# Patient Record
Sex: Male | Born: 1955 | Race: White | Hispanic: No | Marital: Married | Smoking: Never smoker
Health system: Southern US, Community
[De-identification: ages and names within clinical notes are randomized; demographics above are authoritative.]

## PROBLEM LIST (undated history)

## (undated) DIAGNOSIS — Z9289 Personal history of other medical treatment: Secondary | ICD-10-CM

## (undated) DIAGNOSIS — E1129 Type 2 diabetes mellitus with other diabetic kidney complication: Secondary | ICD-10-CM

## (undated) DIAGNOSIS — Z933 Colostomy status: Secondary | ICD-10-CM

## (undated) DIAGNOSIS — N184 Chronic kidney disease, stage 4 (severe): Secondary | ICD-10-CM

## (undated) DIAGNOSIS — R42 Dizziness and giddiness: Secondary | ICD-10-CM

## (undated) DIAGNOSIS — C61 Malignant neoplasm of prostate: Secondary | ICD-10-CM

## (undated) DIAGNOSIS — R112 Nausea with vomiting, unspecified: Secondary | ICD-10-CM

## (undated) DIAGNOSIS — G459 Transient cerebral ischemic attack, unspecified: Secondary | ICD-10-CM

## (undated) DIAGNOSIS — Z9889 Other specified postprocedural states: Secondary | ICD-10-CM

## (undated) DIAGNOSIS — I499 Cardiac arrhythmia, unspecified: Secondary | ICD-10-CM

## (undated) DIAGNOSIS — I255 Ischemic cardiomyopathy: Secondary | ICD-10-CM

## (undated) DIAGNOSIS — I5022 Chronic systolic (congestive) heart failure: Secondary | ICD-10-CM

## (undated) DIAGNOSIS — IMO0002 Reserved for concepts with insufficient information to code with codable children: Secondary | ICD-10-CM

## (undated) DIAGNOSIS — K219 Gastro-esophageal reflux disease without esophagitis: Secondary | ICD-10-CM

## (undated) DIAGNOSIS — E669 Obesity, unspecified: Secondary | ICD-10-CM

## (undated) DIAGNOSIS — I509 Heart failure, unspecified: Secondary | ICD-10-CM

## (undated) DIAGNOSIS — G4733 Obstructive sleep apnea (adult) (pediatric): Secondary | ICD-10-CM

## (undated) DIAGNOSIS — K8020 Calculus of gallbladder without cholecystitis without obstruction: Secondary | ICD-10-CM

## (undated) DIAGNOSIS — E785 Hyperlipidemia, unspecified: Secondary | ICD-10-CM

## (undated) DIAGNOSIS — D649 Anemia, unspecified: Secondary | ICD-10-CM

## (undated) DIAGNOSIS — Z95810 Presence of automatic (implantable) cardiac defibrillator: Secondary | ICD-10-CM

## (undated) DIAGNOSIS — J189 Pneumonia, unspecified organism: Secondary | ICD-10-CM

## (undated) DIAGNOSIS — Z9989 Dependence on other enabling machines and devices: Secondary | ICD-10-CM

## (undated) DIAGNOSIS — K7460 Unspecified cirrhosis of liver: Secondary | ICD-10-CM

## (undated) DIAGNOSIS — I951 Orthostatic hypotension: Principal | ICD-10-CM

## (undated) DIAGNOSIS — I480 Paroxysmal atrial fibrillation: Secondary | ICD-10-CM

## (undated) DIAGNOSIS — C20 Malignant neoplasm of rectum: Secondary | ICD-10-CM

## (undated) DIAGNOSIS — E1165 Type 2 diabetes mellitus with hyperglycemia: Secondary | ICD-10-CM

## (undated) DIAGNOSIS — R319 Hematuria, unspecified: Secondary | ICD-10-CM

## (undated) DIAGNOSIS — I10 Essential (primary) hypertension: Secondary | ICD-10-CM

## (undated) DIAGNOSIS — I2510 Atherosclerotic heart disease of native coronary artery without angina pectoris: Secondary | ICD-10-CM

## (undated) DIAGNOSIS — Z950 Presence of cardiac pacemaker: Secondary | ICD-10-CM

## (undated) DIAGNOSIS — I609 Nontraumatic subarachnoid hemorrhage, unspecified: Secondary | ICD-10-CM

## (undated) DIAGNOSIS — I219 Acute myocardial infarction, unspecified: Secondary | ICD-10-CM

## (undated) HISTORY — DX: Heart failure, unspecified: I50.9

## (undated) HISTORY — PX: CORONARY ANGIOPLASTY WITH STENT PLACEMENT: SHX49

## (undated) HISTORY — DX: Transient cerebral ischemic attack, unspecified: G45.9

## (undated) HISTORY — DX: Malignant neoplasm of prostate: C61

## (undated) HISTORY — PX: CARDIAC CATHETERIZATION: SHX172

## (undated) HISTORY — DX: Atherosclerotic heart disease of native coronary artery without angina pectoris: I25.10

---

## 1999-12-07 DIAGNOSIS — I219 Acute myocardial infarction, unspecified: Secondary | ICD-10-CM

## 1999-12-07 HISTORY — DX: Acute myocardial infarction, unspecified: I21.9

## 2000-05-18 ENCOUNTER — Ambulatory Visit (HOSPITAL_COMMUNITY): Admission: RE | Admit: 2000-05-18 | Discharge: 2000-05-18 | Payer: Self-pay | Admitting: Cardiovascular Disease

## 2000-05-18 ENCOUNTER — Encounter: Payer: Self-pay | Admitting: Cardiovascular Disease

## 2000-12-06 DIAGNOSIS — Z95810 Presence of automatic (implantable) cardiac defibrillator: Secondary | ICD-10-CM

## 2000-12-06 HISTORY — PX: CARDIAC DEFIBRILLATOR PLACEMENT: SHX171

## 2000-12-06 HISTORY — DX: Presence of automatic (implantable) cardiac defibrillator: Z95.810

## 2001-08-06 ENCOUNTER — Encounter: Payer: Self-pay | Admitting: Emergency Medicine

## 2001-08-06 ENCOUNTER — Inpatient Hospital Stay (HOSPITAL_COMMUNITY): Admission: EM | Admit: 2001-08-06 | Discharge: 2001-08-12 | Payer: Self-pay | Admitting: Emergency Medicine

## 2001-08-12 ENCOUNTER — Encounter: Payer: Self-pay | Admitting: Cardiovascular Disease

## 2001-08-15 ENCOUNTER — Encounter: Payer: Self-pay | Admitting: Cardiovascular Disease

## 2001-08-15 ENCOUNTER — Ambulatory Visit (HOSPITAL_COMMUNITY): Admission: RE | Admit: 2001-08-15 | Discharge: 2001-08-15 | Payer: Self-pay | Admitting: Cardiovascular Disease

## 2002-06-07 ENCOUNTER — Encounter: Admission: RE | Admit: 2002-06-07 | Discharge: 2002-09-05 | Payer: Self-pay | Admitting: Internal Medicine

## 2002-08-30 ENCOUNTER — Emergency Department (HOSPITAL_COMMUNITY): Admission: EM | Admit: 2002-08-30 | Discharge: 2002-08-30 | Payer: Self-pay | Admitting: Internal Medicine

## 2002-08-30 ENCOUNTER — Encounter: Payer: Self-pay | Admitting: Internal Medicine

## 2006-12-06 HISTORY — PX: COLOSTOMY: SHX63

## 2007-05-10 ENCOUNTER — Ambulatory Visit: Payer: Self-pay | Admitting: Gastroenterology

## 2007-05-17 ENCOUNTER — Encounter: Payer: Self-pay | Admitting: Internal Medicine

## 2007-05-17 ENCOUNTER — Ambulatory Visit: Payer: Self-pay | Admitting: Internal Medicine

## 2007-05-17 ENCOUNTER — Ambulatory Visit (HOSPITAL_COMMUNITY): Admission: RE | Admit: 2007-05-17 | Discharge: 2007-05-17 | Payer: Self-pay | Admitting: Internal Medicine

## 2007-05-25 ENCOUNTER — Ambulatory Visit: Payer: Self-pay | Admitting: Oncology

## 2007-05-26 ENCOUNTER — Ambulatory Visit (HOSPITAL_COMMUNITY): Admission: RE | Admit: 2007-05-26 | Discharge: 2007-05-26 | Payer: Self-pay | Admitting: General Surgery

## 2007-06-06 ENCOUNTER — Ambulatory Visit: Admission: RE | Admit: 2007-06-06 | Discharge: 2007-08-22 | Payer: Self-pay | Admitting: Radiation Oncology

## 2007-06-06 HISTORY — PX: PORTACATH PLACEMENT: SHX2246

## 2007-06-06 LAB — CEA: CEA: 0.9 ng/mL (ref 0.0–5.0)

## 2007-06-06 LAB — COMPREHENSIVE METABOLIC PANEL
Albumin: 4.1 g/dL (ref 3.5–5.2)
CO2: 24 mEq/L (ref 19–32)
Chloride: 103 mEq/L (ref 96–112)
Glucose, Bld: 213 mg/dL — ABNORMAL HIGH (ref 70–99)
Potassium: 4.3 mEq/L (ref 3.5–5.3)
Sodium: 138 mEq/L (ref 135–145)
Total Protein: 6.7 g/dL (ref 6.0–8.3)

## 2007-06-06 LAB — CBC WITH DIFFERENTIAL/PLATELET
Eosinophils Absolute: 0.2 10*3/uL (ref 0.0–0.5)
LYMPH%: 28.6 % (ref 14.0–48.0)
MONO#: 0.6 10*3/uL (ref 0.1–0.9)
NEUT#: 4.8 10*3/uL (ref 1.5–6.5)
Platelets: 152 10*3/uL (ref 145–400)
RBC: 4.24 10*6/uL (ref 4.20–5.71)
RDW: 12.8 % (ref 11.2–14.6)
WBC: 8 10*3/uL (ref 4.0–10.0)

## 2007-06-07 LAB — PSA, MEDICARE: PSA: 3.82 ng/mL (ref 0.10–4.00)

## 2007-06-08 ENCOUNTER — Ambulatory Visit (HOSPITAL_COMMUNITY): Admission: RE | Admit: 2007-06-08 | Discharge: 2007-06-08 | Payer: Self-pay | Admitting: Surgery

## 2007-06-26 LAB — CBC WITH DIFFERENTIAL/PLATELET
BASO%: 0.3 % (ref 0.0–2.0)
EOS%: 2.2 % (ref 0.0–7.0)
MCH: 32.2 pg (ref 28.0–33.4)
MCHC: 35.4 g/dL (ref 32.0–35.9)
MONO#: 0.7 10*3/uL (ref 0.1–0.9)
RDW: 12.5 % (ref 11.2–14.6)
WBC: 9.3 10*3/uL (ref 4.0–10.0)
lymph#: 2.4 10*3/uL (ref 0.9–3.3)

## 2007-07-07 ENCOUNTER — Ambulatory Visit (HOSPITAL_COMMUNITY): Admission: RE | Admit: 2007-07-07 | Discharge: 2007-07-07 | Payer: Self-pay | Admitting: Urology

## 2007-07-11 LAB — CBC WITH DIFFERENTIAL/PLATELET
Basophils Absolute: 0 10*3/uL (ref 0.0–0.1)
EOS%: 2.9 % (ref 0.0–7.0)
Eosinophils Absolute: 0.3 10*3/uL (ref 0.0–0.5)
HCT: 40 % (ref 38.7–49.9)
HGB: 14 g/dL (ref 13.0–17.1)
MONO#: 0.5 10*3/uL (ref 0.1–0.9)
NEUT#: 6.6 10*3/uL — ABNORMAL HIGH (ref 1.5–6.5)
RDW: 12.4 % (ref 11.2–14.6)
WBC: 9.5 10*3/uL (ref 4.0–10.0)
lymph#: 2.1 10*3/uL (ref 0.9–3.3)

## 2007-07-17 ENCOUNTER — Ambulatory Visit: Payer: Self-pay | Admitting: Oncology

## 2007-07-17 LAB — CBC WITH DIFFERENTIAL/PLATELET
Basophils Absolute: 0 10*3/uL (ref 0.0–0.1)
Eosinophils Absolute: 0.2 10*3/uL (ref 0.0–0.5)
HCT: 37.3 % — ABNORMAL LOW (ref 38.7–49.9)
HGB: 13.4 g/dL (ref 13.0–17.1)
MCV: 90 fL (ref 81.6–98.0)
MONO%: 7.2 % (ref 0.0–13.0)
NEUT#: 5.6 10*3/uL (ref 1.5–6.5)
NEUT%: 67.1 % (ref 40.0–75.0)
Platelets: 153 10*3/uL (ref 145–400)
RDW: 10.5 % — ABNORMAL LOW (ref 11.2–14.6)

## 2007-07-25 LAB — CBC WITH DIFFERENTIAL/PLATELET
Basophils Absolute: 0 10*3/uL (ref 0.0–0.1)
Eosinophils Absolute: 0.2 10*3/uL (ref 0.0–0.5)
HGB: 12.8 g/dL — ABNORMAL LOW (ref 13.0–17.1)
LYMPH%: 14.7 % (ref 14.0–48.0)
MCV: 92.2 fL (ref 81.6–98.0)
MONO%: 8.1 % (ref 0.0–13.0)
NEUT#: 4.4 10*3/uL (ref 1.5–6.5)
Platelets: 122 10*3/uL — ABNORMAL LOW (ref 145–400)
RBC: 3.91 10*6/uL — ABNORMAL LOW (ref 4.20–5.71)

## 2007-07-31 LAB — CBC WITH DIFFERENTIAL/PLATELET
BASO%: 0.5 % (ref 0.0–2.0)
LYMPH%: 13.6 % — ABNORMAL LOW (ref 14.0–48.0)
MCHC: 35.2 g/dL (ref 32.0–35.9)
MCV: 92.5 fL (ref 81.6–98.0)
MONO%: 8.2 % (ref 0.0–13.0)
Platelets: 154 10*3/uL (ref 145–400)
RBC: 4.08 10*6/uL — ABNORMAL LOW (ref 4.20–5.71)

## 2007-08-08 LAB — CBC WITH DIFFERENTIAL/PLATELET
Eosinophils Absolute: 0.3 10*3/uL (ref 0.0–0.5)
LYMPH%: 12.8 % — ABNORMAL LOW (ref 14.0–48.0)
MCH: 32.9 pg (ref 28.0–33.4)
MCHC: 36.5 g/dL — ABNORMAL HIGH (ref 32.0–35.9)
MCV: 89.9 fL (ref 81.6–98.0)
MONO%: 9.3 % (ref 0.0–13.0)
NEUT#: 4.4 10*3/uL (ref 1.5–6.5)
Platelets: 163 10*3/uL (ref 145–400)
RBC: 4.18 10*6/uL — ABNORMAL LOW (ref 4.20–5.71)

## 2007-08-17 LAB — CBC WITH DIFFERENTIAL/PLATELET
BASO%: 0.3 % (ref 0.0–2.0)
LYMPH%: 10.3 % — ABNORMAL LOW (ref 14.0–48.0)
MCHC: 35.5 g/dL (ref 32.0–35.9)
MCV: 94.8 fL (ref 81.6–98.0)
MONO#: 0.7 10*3/uL (ref 0.1–0.9)
MONO%: 11.5 % (ref 0.0–13.0)
Platelets: 171 10*3/uL (ref 145–400)
RBC: 4.06 10*6/uL — ABNORMAL LOW (ref 4.20–5.71)
WBC: 5.8 10*3/uL (ref 4.0–10.0)

## 2007-10-04 ENCOUNTER — Encounter (INDEPENDENT_AMBULATORY_CARE_PROVIDER_SITE_OTHER): Payer: Self-pay | Admitting: General Surgery

## 2007-10-04 ENCOUNTER — Inpatient Hospital Stay (HOSPITAL_COMMUNITY): Admission: RE | Admit: 2007-10-04 | Discharge: 2007-10-15 | Payer: Self-pay | Admitting: General Surgery

## 2007-10-04 HISTORY — PX: OTHER SURGICAL HISTORY: SHX169

## 2007-10-06 ENCOUNTER — Ambulatory Visit: Payer: Self-pay | Admitting: Oncology

## 2007-11-07 LAB — CBC WITH DIFFERENTIAL/PLATELET
BASO%: 0.2 % (ref 0.0–2.0)
MCHC: 35.2 g/dL (ref 32.0–35.9)
MONO#: 0.5 10*3/uL (ref 0.1–0.9)
RBC: 3.53 10*6/uL — ABNORMAL LOW (ref 4.20–5.71)
RDW: 13.1 % (ref 11.2–14.6)
WBC: 6.4 10*3/uL (ref 4.0–10.0)
lymph#: 0.9 10*3/uL (ref 0.9–3.3)

## 2007-11-07 LAB — COMPREHENSIVE METABOLIC PANEL
ALT: 13 U/L (ref 0–53)
CO2: 26 mEq/L (ref 19–32)
Calcium: 8.4 mg/dL (ref 8.4–10.5)
Chloride: 103 mEq/L (ref 96–112)
Sodium: 133 mEq/L — ABNORMAL LOW (ref 135–145)
Total Protein: 6.4 g/dL (ref 6.0–8.3)

## 2007-11-21 ENCOUNTER — Ambulatory Visit: Payer: Self-pay | Admitting: Oncology

## 2007-11-21 LAB — CBC WITH DIFFERENTIAL/PLATELET
BASO%: 1.2 % (ref 0.0–2.0)
EOS%: 4 % (ref 0.0–7.0)
LYMPH%: 14.2 % (ref 14.0–48.0)
MCH: 32.3 pg (ref 28.0–33.4)
MCHC: 35.2 g/dL (ref 32.0–35.9)
MONO#: 0.6 10*3/uL (ref 0.1–0.9)
Platelets: 154 10*3/uL (ref 145–400)
RBC: 3.66 10*6/uL — ABNORMAL LOW (ref 4.20–5.71)
WBC: 5 10*3/uL (ref 4.0–10.0)
lymph#: 0.7 10*3/uL — ABNORMAL LOW (ref 0.9–3.3)

## 2007-11-21 LAB — COMPREHENSIVE METABOLIC PANEL
ALT: 15 U/L (ref 0–53)
AST: 14 U/L (ref 0–37)
CO2: 27 mEq/L (ref 19–32)
Sodium: 135 mEq/L (ref 135–145)
Total Bilirubin: 0.7 mg/dL (ref 0.3–1.2)
Total Protein: 6.3 g/dL (ref 6.0–8.3)

## 2007-12-07 HISTORY — PX: CARDIAC DEFIBRILLATOR PLACEMENT: SHX171

## 2007-12-07 HISTORY — PX: BI-VENTRICULAR IMPLANTABLE CARDIOVERTER DEFIBRILLATOR  (CRT-D): SHX5747

## 2007-12-11 LAB — CBC WITH DIFFERENTIAL/PLATELET
BASO%: 0.4 % (ref 0.0–2.0)
Basophils Absolute: 0 10*3/uL (ref 0.0–0.1)
Eosinophils Absolute: 0.2 10*3/uL (ref 0.0–0.5)
HCT: 37.3 % — ABNORMAL LOW (ref 38.7–49.9)
HGB: 13.1 g/dL (ref 13.0–17.1)
LYMPH%: 23.3 % (ref 14.0–48.0)
MCHC: 35 g/dL (ref 32.0–35.9)
MONO#: 0.6 10*3/uL (ref 0.1–0.9)
NEUT#: 2.2 10*3/uL (ref 1.5–6.5)
NEUT%: 55.5 % (ref 40.0–75.0)
Platelets: 195 10*3/uL (ref 145–400)
WBC: 4 10*3/uL (ref 4.0–10.0)
lymph#: 0.9 10*3/uL (ref 0.9–3.3)

## 2007-12-11 LAB — COMPREHENSIVE METABOLIC PANEL
ALT: 13 U/L (ref 0–53)
CO2: 25 mEq/L (ref 19–32)
Calcium: 10 mg/dL (ref 8.4–10.5)
Chloride: 100 mEq/L (ref 96–112)
Creatinine, Ser: 1.19 mg/dL (ref 0.40–1.50)
Glucose, Bld: 264 mg/dL — ABNORMAL HIGH (ref 70–99)

## 2007-12-25 LAB — CBC WITH DIFFERENTIAL/PLATELET
BASO%: 0.1 % (ref 0.0–2.0)
Eosinophils Absolute: 0.1 10*3/uL (ref 0.0–0.5)
HCT: 36.8 % — ABNORMAL LOW (ref 38.7–49.9)
MCHC: 35.2 g/dL (ref 32.0–35.9)
MONO#: 0.4 10*3/uL (ref 0.1–0.9)
NEUT#: 4.7 10*3/uL (ref 1.5–6.5)
RBC: 4.03 10*6/uL — ABNORMAL LOW (ref 4.20–5.71)
WBC: 6.3 10*3/uL (ref 4.0–10.0)
lymph#: 1 10*3/uL (ref 0.9–3.3)

## 2007-12-25 LAB — COMPREHENSIVE METABOLIC PANEL
ALT: 12 U/L (ref 0–53)
Albumin: 3.7 g/dL (ref 3.5–5.2)
CO2: 25 mEq/L (ref 19–32)
Calcium: 9.2 mg/dL (ref 8.4–10.5)
Chloride: 99 mEq/L (ref 96–112)
Sodium: 138 mEq/L (ref 135–145)
Total Protein: 6.4 g/dL (ref 6.0–8.3)

## 2008-01-05 ENCOUNTER — Ambulatory Visit: Payer: Self-pay | Admitting: Oncology

## 2008-01-08 LAB — CBC WITH DIFFERENTIAL/PLATELET
BASO%: 0.6 % (ref 0.0–2.0)
MCHC: 35 g/dL (ref 32.0–35.9)
MONO#: 0.4 10*3/uL (ref 0.1–0.9)
RBC: 3.95 10*6/uL — ABNORMAL LOW (ref 4.20–5.71)
WBC: 3.9 10*3/uL — ABNORMAL LOW (ref 4.0–10.0)
lymph#: 0.7 10*3/uL — ABNORMAL LOW (ref 0.9–3.3)

## 2008-01-08 LAB — COMPREHENSIVE METABOLIC PANEL
ALT: 14 U/L (ref 0–53)
AST: 18 U/L (ref 0–37)
CO2: 23 mEq/L (ref 19–32)
Calcium: 9 mg/dL (ref 8.4–10.5)
Chloride: 100 mEq/L (ref 96–112)
Sodium: 135 mEq/L (ref 135–145)
Total Bilirubin: 0.7 mg/dL (ref 0.3–1.2)
Total Protein: 6.2 g/dL (ref 6.0–8.3)

## 2008-01-16 LAB — CBC WITH DIFFERENTIAL/PLATELET
BASO%: 1.7 % (ref 0.0–2.0)
EOS%: 5 % (ref 0.0–7.0)
LYMPH%: 31.1 % (ref 14.0–48.0)
MCH: 31.3 pg (ref 28.0–33.4)
MCHC: 34.5 g/dL (ref 32.0–35.9)
MONO#: 0.6 10*3/uL (ref 0.1–0.9)
Platelets: 143 10*3/uL — ABNORMAL LOW (ref 145–400)
RBC: 4.12 10*6/uL — ABNORMAL LOW (ref 4.20–5.71)
WBC: 3 10*3/uL — ABNORMAL LOW (ref 4.0–10.0)

## 2008-01-29 LAB — CBC WITH DIFFERENTIAL/PLATELET
Basophils Absolute: 0 10*3/uL (ref 0.0–0.1)
Eosinophils Absolute: 0.1 10*3/uL (ref 0.0–0.5)
HCT: 38 % — ABNORMAL LOW (ref 38.7–49.9)
HGB: 13.6 g/dL (ref 13.0–17.1)
LYMPH%: 22 % (ref 14.0–48.0)
MONO#: 0.5 10*3/uL (ref 0.1–0.9)
NEUT#: 2.6 10*3/uL (ref 1.5–6.5)
NEUT%: 61.8 % (ref 40.0–75.0)
Platelets: 96 10*3/uL — ABNORMAL LOW (ref 145–400)
RBC: 4.14 10*6/uL — ABNORMAL LOW (ref 4.20–5.71)
WBC: 4.2 10*3/uL (ref 4.0–10.0)

## 2008-01-29 LAB — COMPREHENSIVE METABOLIC PANEL
CO2: 21 mEq/L (ref 19–32)
Glucose, Bld: 324 mg/dL — ABNORMAL HIGH (ref 70–99)
Sodium: 134 mEq/L — ABNORMAL LOW (ref 135–145)
Total Bilirubin: 0.5 mg/dL (ref 0.3–1.2)
Total Protein: 6.6 g/dL (ref 6.0–8.3)

## 2008-02-06 LAB — CBC WITH DIFFERENTIAL/PLATELET
Basophils Absolute: 0 10*3/uL (ref 0.0–0.1)
EOS%: 5.2 % (ref 0.0–7.0)
HCT: 37.7 % — ABNORMAL LOW (ref 38.7–49.9)
HGB: 13.2 g/dL (ref 13.0–17.1)
LYMPH%: 27.9 % (ref 14.0–48.0)
MCH: 32.2 pg (ref 28.0–33.4)
MCV: 92.1 fL (ref 81.6–98.0)
MONO%: 20.2 % — ABNORMAL HIGH (ref 0.0–13.0)
NEUT%: 46.4 % (ref 40.0–75.0)
RDW: 17 % — ABNORMAL HIGH (ref 11.2–14.6)

## 2008-02-15 ENCOUNTER — Ambulatory Visit: Payer: Self-pay | Admitting: Oncology

## 2008-02-19 LAB — CBC WITH DIFFERENTIAL/PLATELET
BASO%: 0 % (ref 0.0–2.0)
LYMPH%: 20.1 % (ref 14.0–48.0)
MCH: 33.1 pg (ref 28.0–33.4)
MCHC: 35.4 g/dL (ref 32.0–35.9)
MCV: 93.6 fL (ref 81.6–98.0)
NEUT%: 65.3 % (ref 40.0–75.0)
RBC: 3.82 10*6/uL — ABNORMAL LOW (ref 4.20–5.71)
RDW: 15.9 % — ABNORMAL HIGH (ref 11.2–14.6)

## 2008-02-19 LAB — COMPREHENSIVE METABOLIC PANEL
ALT: 12 U/L (ref 0–53)
CO2: 22 mEq/L (ref 19–32)
Calcium: 9.5 mg/dL (ref 8.4–10.5)
Chloride: 103 mEq/L (ref 96–112)
Creatinine, Ser: 1.06 mg/dL (ref 0.40–1.50)
Glucose, Bld: 442 mg/dL — ABNORMAL HIGH (ref 70–99)

## 2008-03-04 LAB — COMPREHENSIVE METABOLIC PANEL
ALT: 15 U/L (ref 0–53)
Albumin: 4 g/dL (ref 3.5–5.2)
CO2: 22 mEq/L (ref 19–32)
Calcium: 9.1 mg/dL (ref 8.4–10.5)
Chloride: 101 mEq/L (ref 96–112)
Creatinine, Ser: 1.09 mg/dL (ref 0.40–1.50)
Potassium: 4.1 mEq/L (ref 3.5–5.3)
Sodium: 136 mEq/L (ref 135–145)
Total Protein: 6.3 g/dL (ref 6.0–8.3)

## 2008-03-04 LAB — CBC WITH DIFFERENTIAL/PLATELET
BASO%: 0.2 % (ref 0.0–2.0)
HCT: 37.2 % — ABNORMAL LOW (ref 38.7–49.9)
MCHC: 35.4 g/dL (ref 32.0–35.9)
MONO#: 0.6 10*3/uL (ref 0.1–0.9)
NEUT%: 66.1 % (ref 40.0–75.0)
WBC: 4.9 10*3/uL (ref 4.0–10.0)
lymph#: 0.8 10*3/uL — ABNORMAL LOW (ref 0.9–3.3)

## 2008-03-12 LAB — CBC WITH DIFFERENTIAL/PLATELET
Basophils Absolute: 0 10*3/uL (ref 0.0–0.1)
EOS%: 4.1 % (ref 0.0–7.0)
HCT: 38.7 % (ref 38.7–49.9)
HGB: 13.8 g/dL (ref 13.0–17.1)
LYMPH%: 24.4 % (ref 14.0–48.0)
MCH: 33.7 pg — ABNORMAL HIGH (ref 28.0–33.4)
MCV: 94.1 fL (ref 81.6–98.0)
MONO%: 17 % — ABNORMAL HIGH (ref 0.0–13.0)
NEUT%: 54.1 % (ref 40.0–75.0)

## 2008-03-25 ENCOUNTER — Ambulatory Visit: Admission: RE | Admit: 2008-03-25 | Discharge: 2008-06-23 | Payer: Self-pay | Admitting: Oncology

## 2008-04-02 ENCOUNTER — Ambulatory Visit: Payer: Self-pay | Admitting: Oncology

## 2008-04-02 LAB — CBC WITH DIFFERENTIAL/PLATELET
Basophils Absolute: 0 10*3/uL (ref 0.0–0.1)
EOS%: 3.7 % (ref 0.0–7.0)
HGB: 13.7 g/dL (ref 13.0–17.1)
MCH: 33.4 pg (ref 28.0–33.4)
MCHC: 35.1 g/dL (ref 32.0–35.9)
MCV: 95.3 fL (ref 81.6–98.0)
MONO%: 16.3 % — ABNORMAL HIGH (ref 0.0–13.0)
RBC: 4.09 10*6/uL — ABNORMAL LOW (ref 4.20–5.71)
RDW: 14.7 % — ABNORMAL HIGH (ref 11.2–14.6)

## 2008-04-02 LAB — COMPREHENSIVE METABOLIC PANEL
AST: 19 U/L (ref 0–37)
Albumin: 3.9 g/dL (ref 3.5–5.2)
Alkaline Phosphatase: 121 U/L — ABNORMAL HIGH (ref 39–117)
BUN: 26 mg/dL — ABNORMAL HIGH (ref 6–23)
Potassium: 4.9 mEq/L (ref 3.5–5.3)

## 2008-04-24 ENCOUNTER — Emergency Department (HOSPITAL_COMMUNITY): Admission: EM | Admit: 2008-04-24 | Discharge: 2008-04-25 | Payer: Self-pay | Admitting: Emergency Medicine

## 2008-05-20 ENCOUNTER — Ambulatory Visit (HOSPITAL_COMMUNITY): Admission: RE | Admit: 2008-05-20 | Discharge: 2008-05-20 | Payer: Self-pay | Admitting: Oncology

## 2008-05-23 ENCOUNTER — Ambulatory Visit: Payer: Self-pay | Admitting: Oncology

## 2008-05-27 LAB — COMPREHENSIVE METABOLIC PANEL
ALT: 13 U/L (ref 0–53)
AST: 16 U/L (ref 0–37)
CO2: 20 mEq/L (ref 19–32)
Calcium: 8.9 mg/dL (ref 8.4–10.5)
Chloride: 104 mEq/L (ref 96–112)
Sodium: 138 mEq/L (ref 135–145)
Total Protein: 6.3 g/dL (ref 6.0–8.3)

## 2008-05-27 LAB — CEA: CEA: 0.8 ng/mL (ref 0.0–5.0)

## 2008-05-27 LAB — CBC WITH DIFFERENTIAL/PLATELET
BASO%: 0.4 % (ref 0.0–2.0)
EOS%: 6.7 % (ref 0.0–7.0)
MCH: 33 pg (ref 28.0–33.4)
MCHC: 35.2 g/dL (ref 32.0–35.9)
MONO#: 0.4 10*3/uL (ref 0.1–0.9)
RBC: 4.05 10*6/uL — ABNORMAL LOW (ref 4.20–5.71)
RDW: 13.2 % (ref 11.2–14.6)
WBC: 4.1 10*3/uL (ref 4.0–10.0)
lymph#: 0.7 10*3/uL — ABNORMAL LOW (ref 0.9–3.3)

## 2008-06-13 ENCOUNTER — Ambulatory Visit (HOSPITAL_COMMUNITY): Admission: RE | Admit: 2008-06-13 | Discharge: 2008-06-13 | Payer: Self-pay | Admitting: General Surgery

## 2008-06-20 DIAGNOSIS — I10 Essential (primary) hypertension: Secondary | ICD-10-CM

## 2008-06-20 DIAGNOSIS — E119 Type 2 diabetes mellitus without complications: Secondary | ICD-10-CM | POA: Insufficient documentation

## 2008-06-20 DIAGNOSIS — I219 Acute myocardial infarction, unspecified: Secondary | ICD-10-CM | POA: Insufficient documentation

## 2008-06-21 DIAGNOSIS — Z85048 Personal history of other malignant neoplasm of rectum, rectosigmoid junction, and anus: Secondary | ICD-10-CM

## 2008-06-21 DIAGNOSIS — I255 Ischemic cardiomyopathy: Secondary | ICD-10-CM

## 2008-08-02 ENCOUNTER — Ambulatory Visit: Payer: Self-pay | Admitting: Internal Medicine

## 2008-08-23 ENCOUNTER — Ambulatory Visit: Payer: Self-pay | Admitting: Oncology

## 2008-08-26 ENCOUNTER — Ambulatory Visit (HOSPITAL_COMMUNITY): Admission: RE | Admit: 2008-08-26 | Discharge: 2008-08-26 | Payer: Self-pay | Admitting: Internal Medicine

## 2008-08-26 ENCOUNTER — Ambulatory Visit: Payer: Self-pay | Admitting: Internal Medicine

## 2008-08-27 LAB — CBC WITH DIFFERENTIAL/PLATELET
BASO%: 0.2 % (ref 0.0–2.0)
EOS%: 3.7 % (ref 0.0–7.0)
LYMPH%: 14.9 % (ref 14.0–48.0)
MCH: 32.8 pg (ref 28.0–33.4)
MCHC: 35.2 g/dL (ref 32.0–35.9)
MCV: 93 fL (ref 81.6–98.0)
MONO#: 0.5 10*3/uL (ref 0.1–0.9)
MONO%: 8.5 % (ref 0.0–13.0)
Platelets: 179 10*3/uL (ref 145–400)
RBC: 3.95 10*6/uL — ABNORMAL LOW (ref 4.20–5.71)
WBC: 6.3 10*3/uL (ref 4.0–10.0)

## 2008-09-22 ENCOUNTER — Inpatient Hospital Stay (HOSPITAL_COMMUNITY): Admission: EM | Admit: 2008-09-22 | Discharge: 2008-09-23 | Payer: Self-pay | Admitting: Emergency Medicine

## 2008-09-27 ENCOUNTER — Inpatient Hospital Stay (HOSPITAL_COMMUNITY): Admission: EM | Admit: 2008-09-27 | Discharge: 2008-10-02 | Payer: Self-pay | Admitting: *Deleted

## 2008-09-27 ENCOUNTER — Ambulatory Visit: Payer: Self-pay | Admitting: Internal Medicine

## 2008-10-04 IMAGING — CT CT CHEST W/ CM
2 of 7 series · 14 of 46 positions shown, 18 images · IV contrast (Omnipaque 300)
Comparison: none

DUPLICATE COPY for exam association in [facility] - No change from original report,   05/29/07
HISTORY: Abnormal colonoscopy, apple core rectal tumor, blood in stool,
 hypertension, diabetes

[Series 7: abd_pel 5.0 b40f · axial · 0.84mm/px · z∈[-767,-330]mm · 11 of 236 slices shown, 15 images]
[im 14/236  soft-tissue]
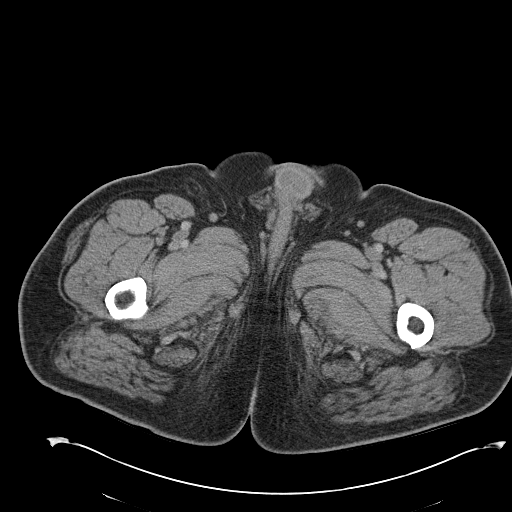
[im 14/236  bone]
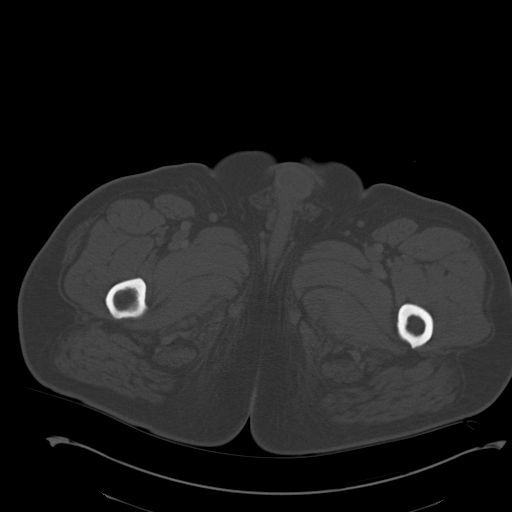
[im 40/236  soft-tissue]
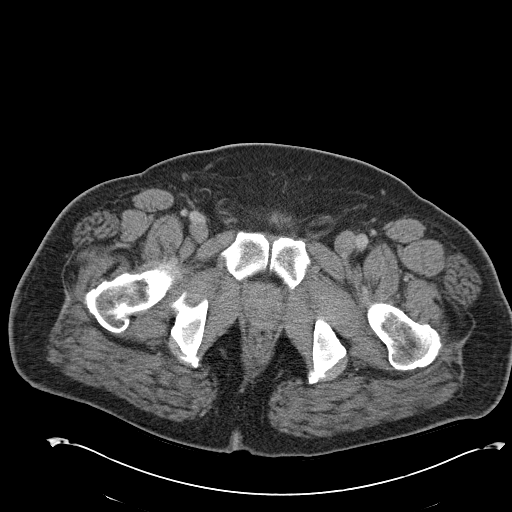
[im 66/236  soft-tissue]
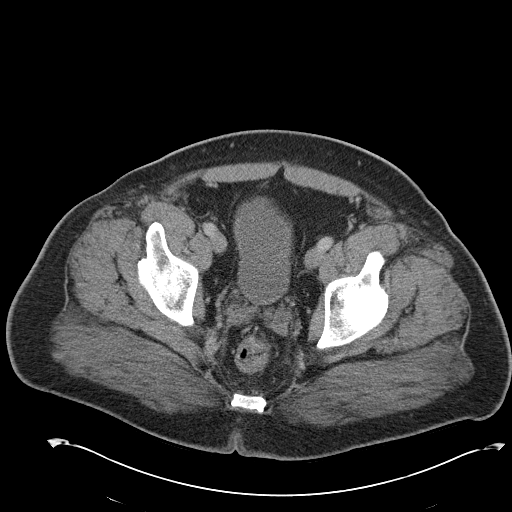
[im 92/236  soft-tissue]
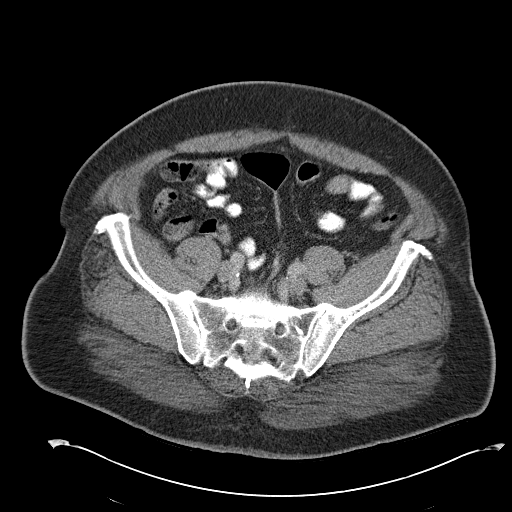
[im 118/236  soft-tissue]
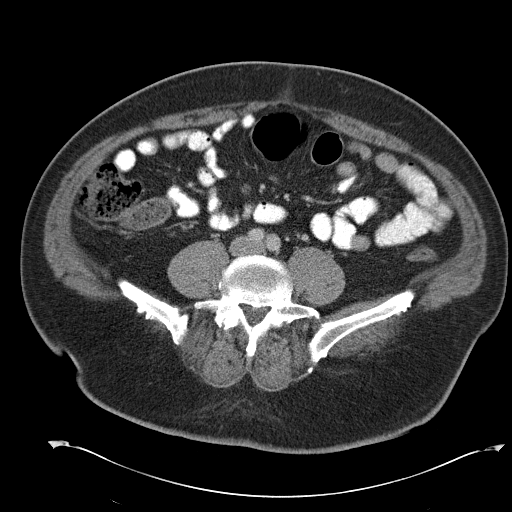
[im 144/236  soft-tissue]
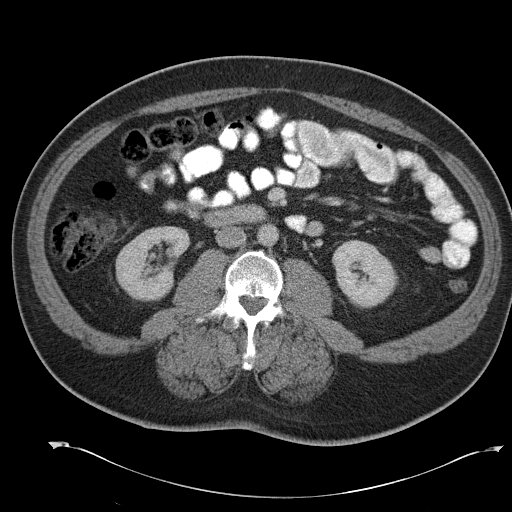
[im 170/236  soft-tissue]
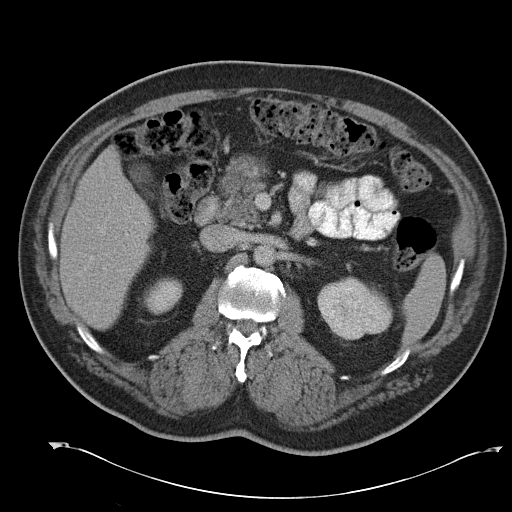
[im 183/236  lung]
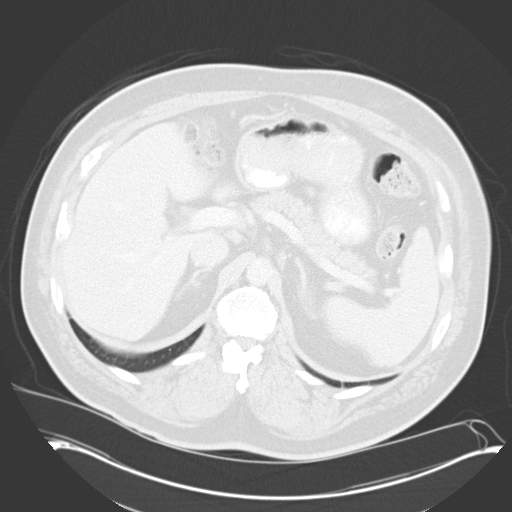
[im 196/236  soft-tissue]
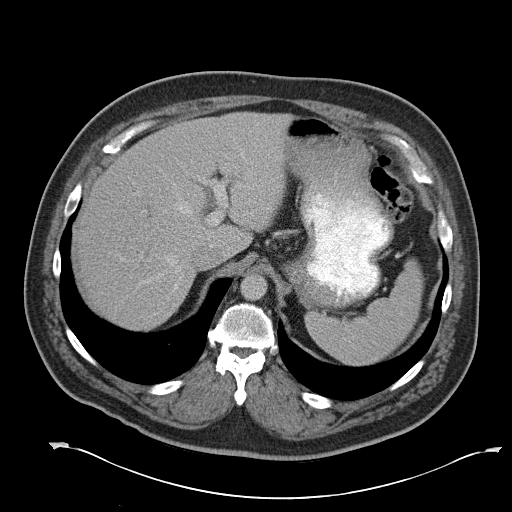
[im 196/236  lung]
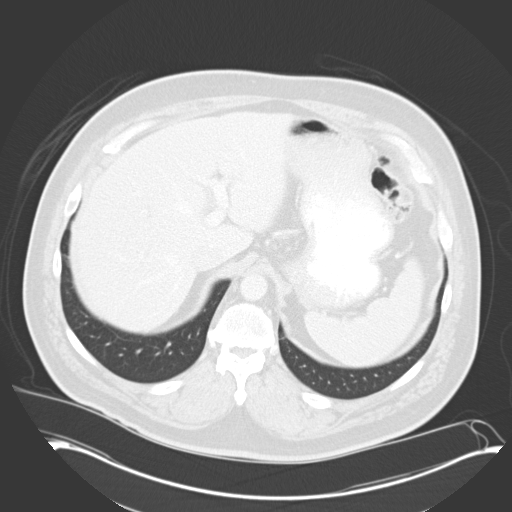
[im 209/236  lung]
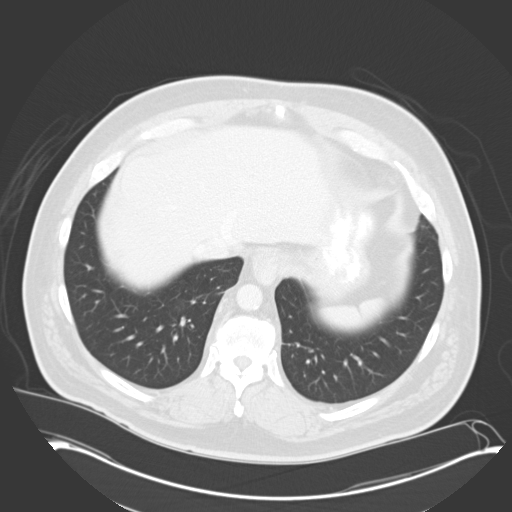
[im 222/236  soft-tissue]
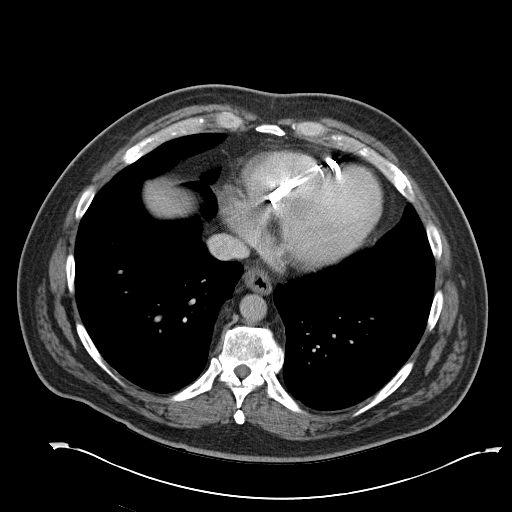
[im 222/236  lung]
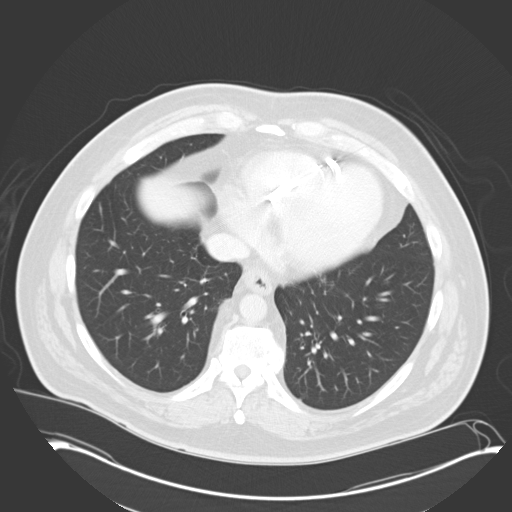
[im 222/236  bone]
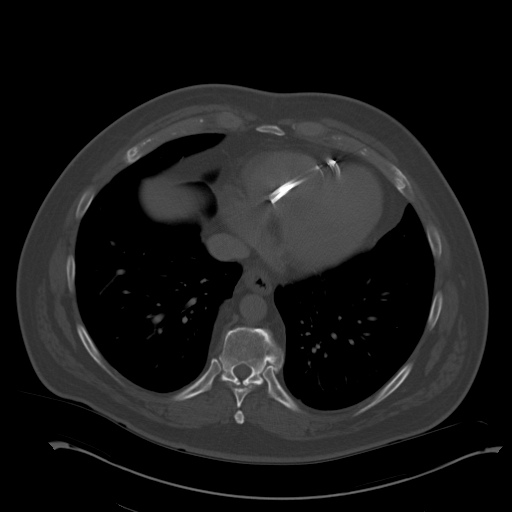

[Series 8: mpr coronal a/p · coronal · 0.79mm/px · 3 of 102 slices shown]
[im 26/102  soft-tissue]
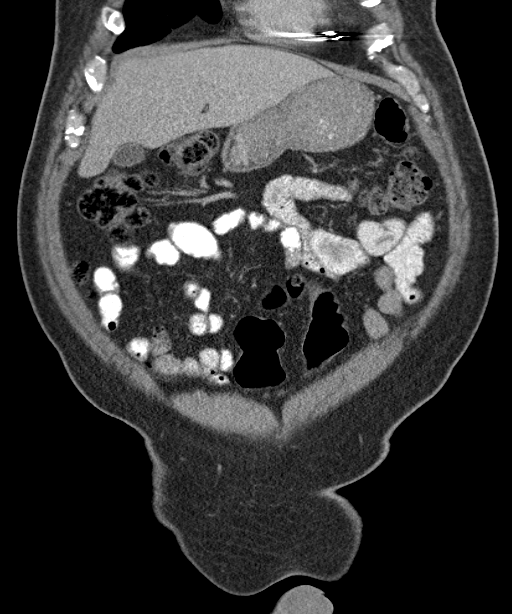
[im 51/102  soft-tissue]
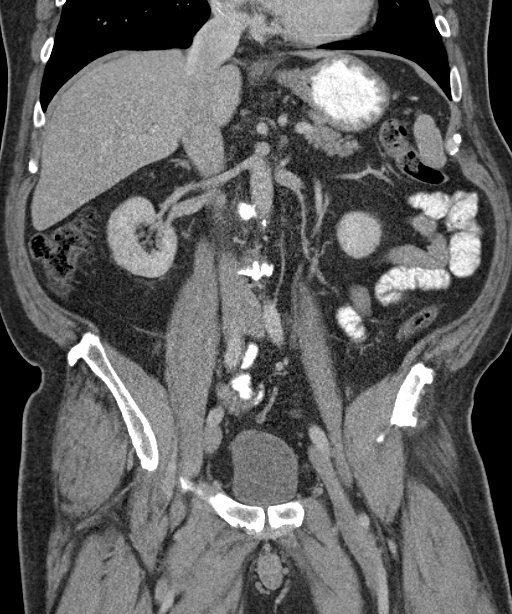
[im 76/102  soft-tissue]
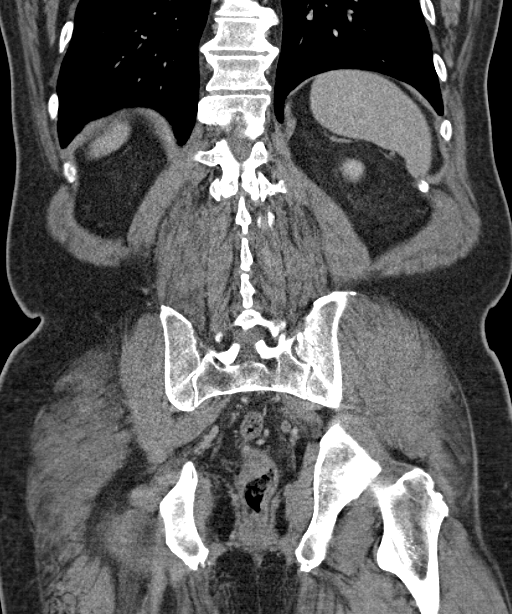

[14 of 46 positions shown; findings below may reference images not displayed]

CT CHEST, ABDOMEN AND PELVIS WITH CONTRAST:

 Multidetector helical CT imaging of chest, abdomen, and pelvis performed.
 Sagittal and coronal images are reconstructed from the axial data set.
 Exam utilized dilute oral contrast and 125 cc 4mnipaque-NLL. 
 No prior exam for comparison.

 CT CHEST:

 Beam hardening artifacts from AICD hardware.
 Mild asymmetric gynecomastia, greater on left.
 Scattered coronary arterial calcifications.
 No thoracic adenopathy.
 Tiny calcified granuloma left lung image 36.
 No other pulmonary nodule, infiltrate, or effusion.
IMPRESSION: No acute thoracic abnormalities, see above.

 CT ABDOMEN:

 Tiny hiatal hernia.
 Liver, spleen, pancreas, kidneys, and adrenal glands normal.
 No upper abdominal mass, adenopathy, free fluid, or inflammatory process.
 Stool throughout colon without definite mass.
 Scattered degenerative disc disease changes thoracolumbar spine.
IMPRESSION: No acute upper abdominal abnormalities.

 CT PELVIS:

 Wall thickening in mid rectum likely represents patient's primary tumor.
 No perirectal extension or infiltrative changes.
 No definite pelvic soft tissue nodule identified or evidence of abnormal fluid
 collection.
 Bladder and seminal vesicles unremarkable with minimal prostatic enlargement
 noted.
 Normal appendix.
 Remaining large and small bowel loops unremarkable.
 No pelvic mass, adenopathy, free fluid, or inflammatory process.
IMPRESSION: Rectal wall thickening compatible with stated history of rectal tumor.
 No evidence of metastatic disease or tumor spread.

## 2008-10-11 ENCOUNTER — Ambulatory Visit (HOSPITAL_COMMUNITY): Admission: RE | Admit: 2008-10-11 | Discharge: 2008-10-11 | Payer: Self-pay | Admitting: Cardiovascular Disease

## 2008-10-15 IMAGING — CR DG CHEST 2V
2 series · 2 of 2 positions shown · non-contrast
Comparison: CT of 05/26/07.

CLINICAL DATA: Rectal cancer.
 CHEST ? 2 VIEW:

[view not recorded (1 of 2)]
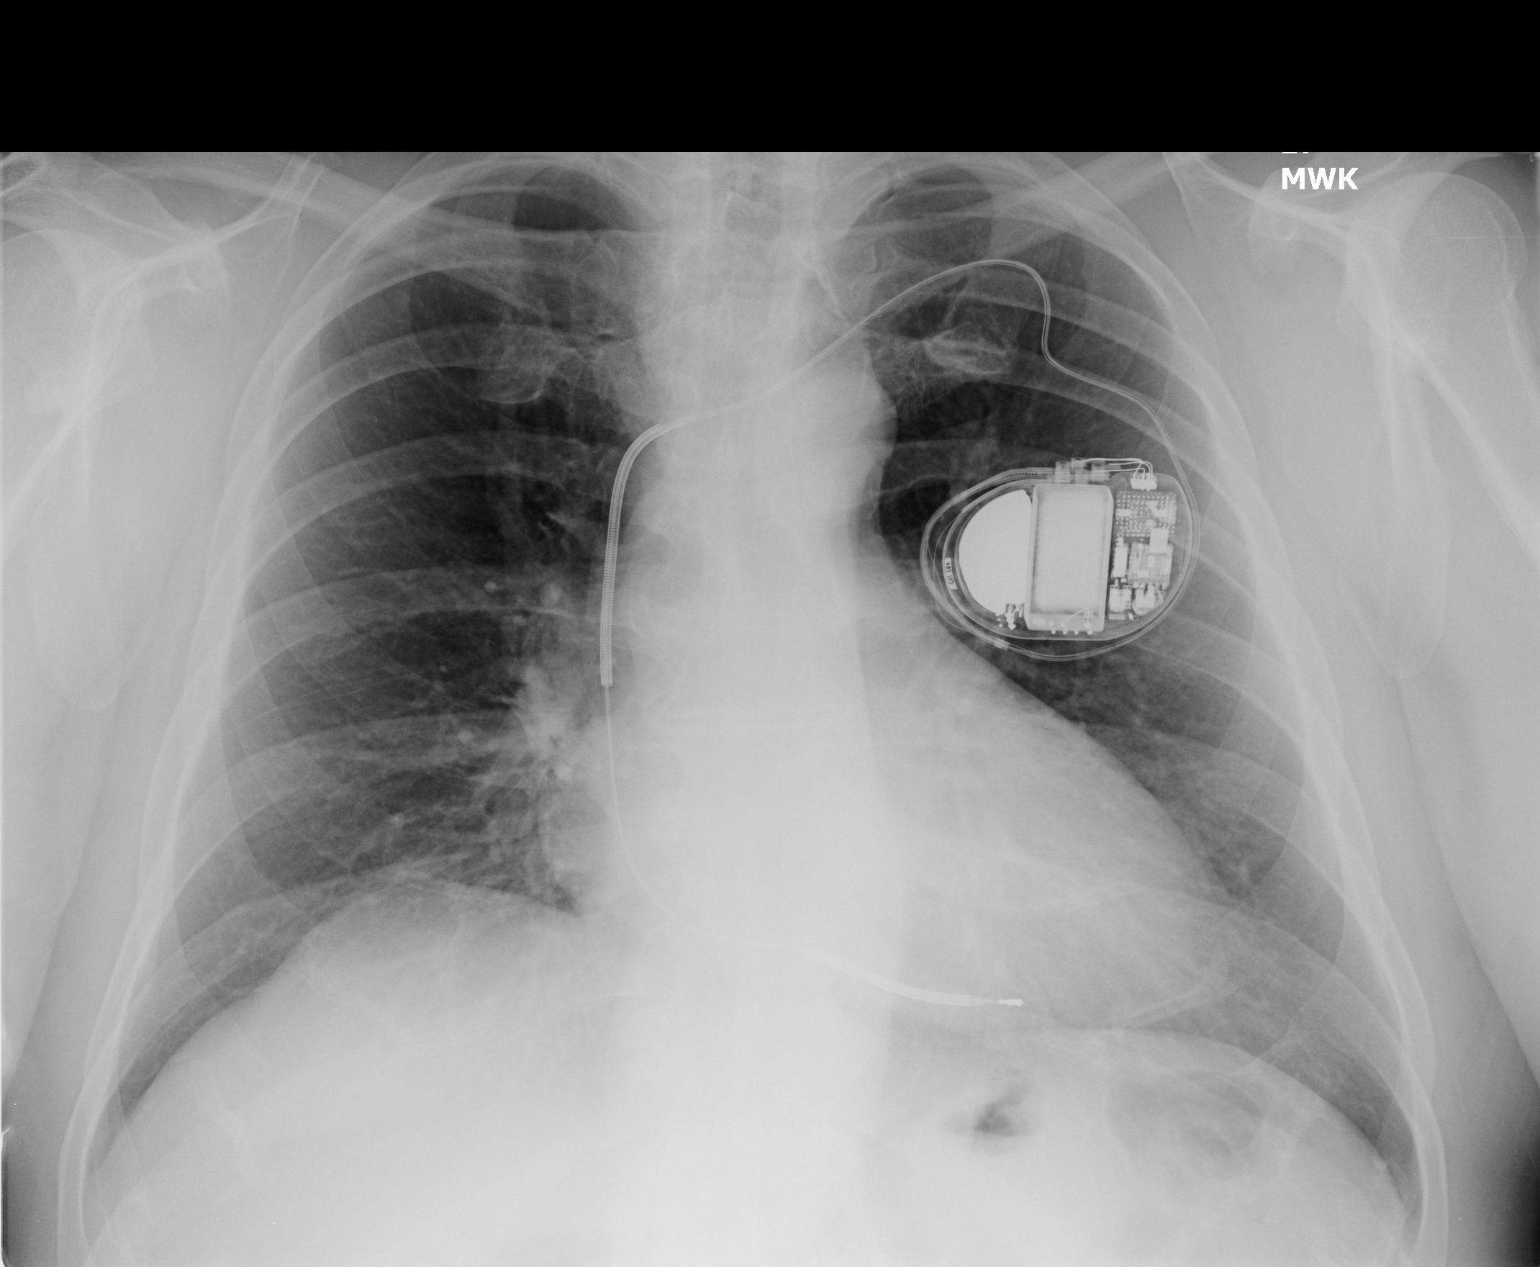

[view not recorded (2 of 2)]
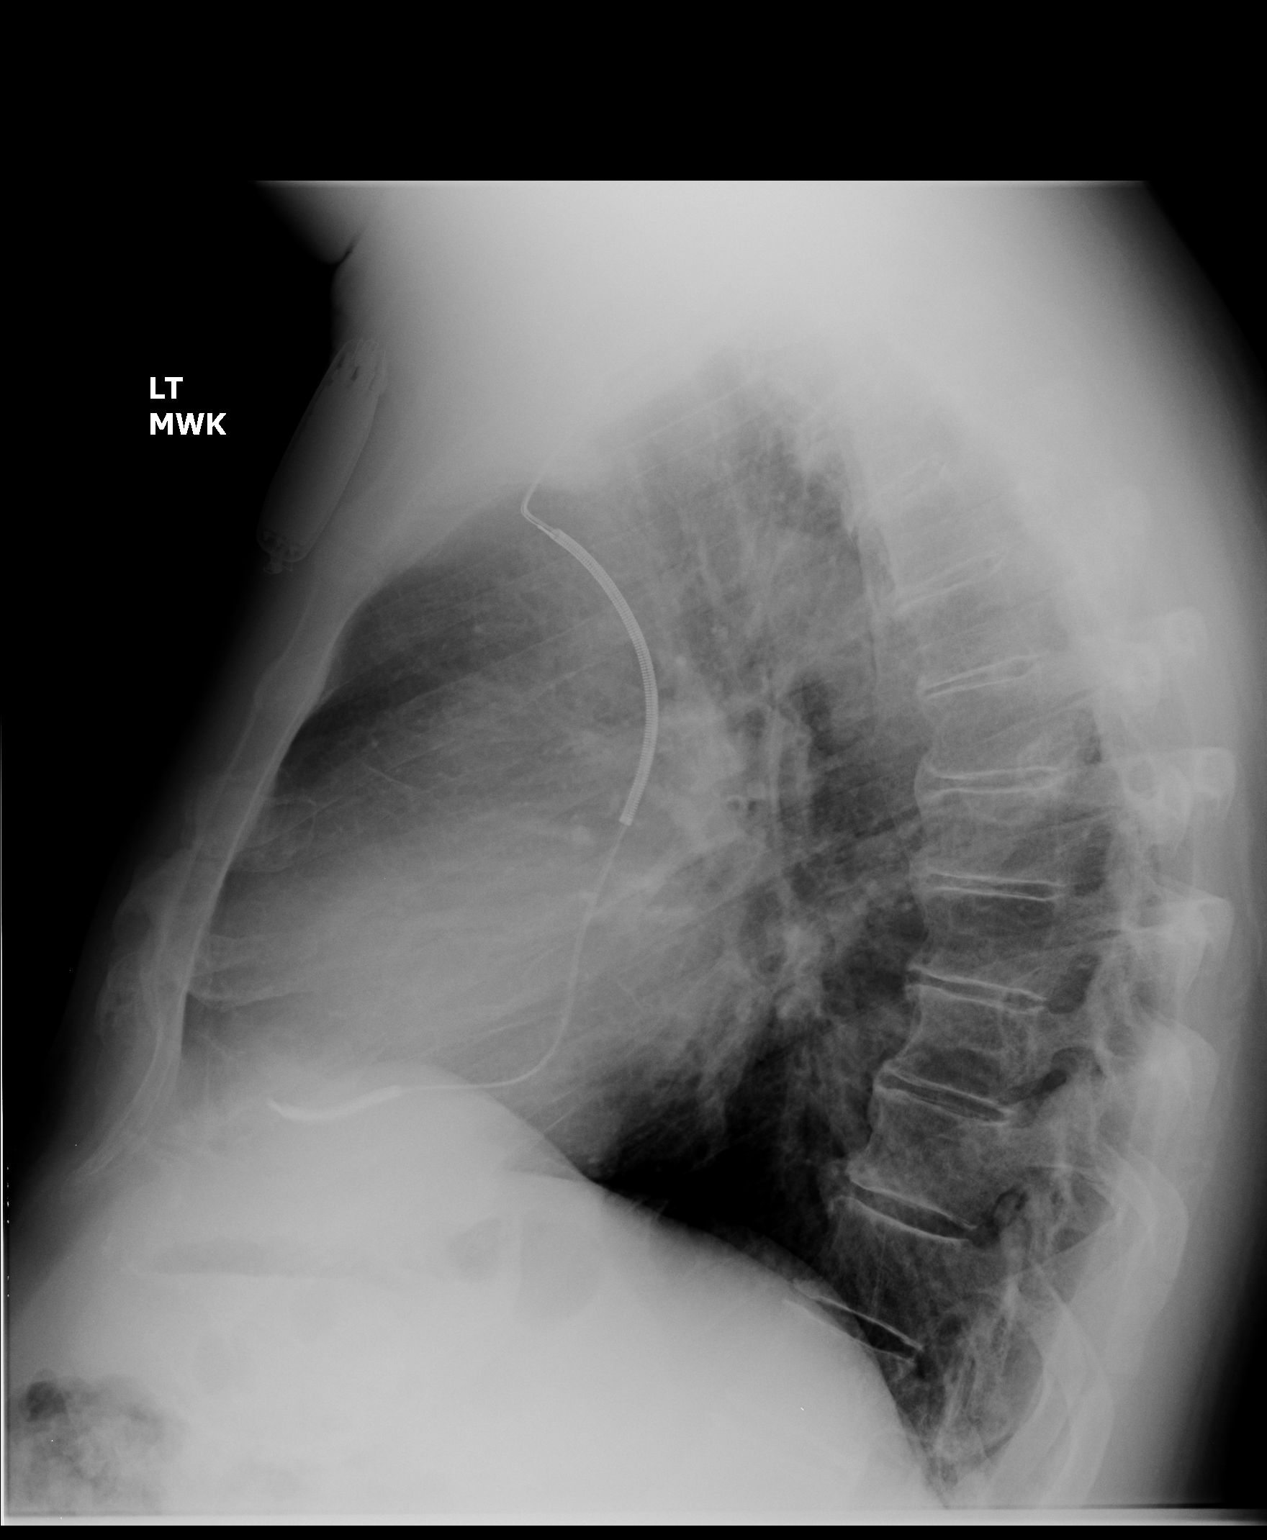

[2 of 2 positions shown; findings below may reference images not displayed]

FINDINGS: Cardioverter defibrillator is in place, grossly well positioned.  The heart is at the upper limits of normal in size.  The vascularity is normal.  The lungs are clear.  No effusions.  Ordinary degenerative changes affect the spine.
IMPRESSION: No active disease.

## 2008-10-17 IMAGING — CR DG CHEST 1V PORT
1 series · 1 of 1 positions shown · non-contrast
Comparison: 06/06/2007.

CLINICAL DATA: Port-A-Cath placement. Rectal cancer.

CHEST - 1 VIEW

[view not recorded]
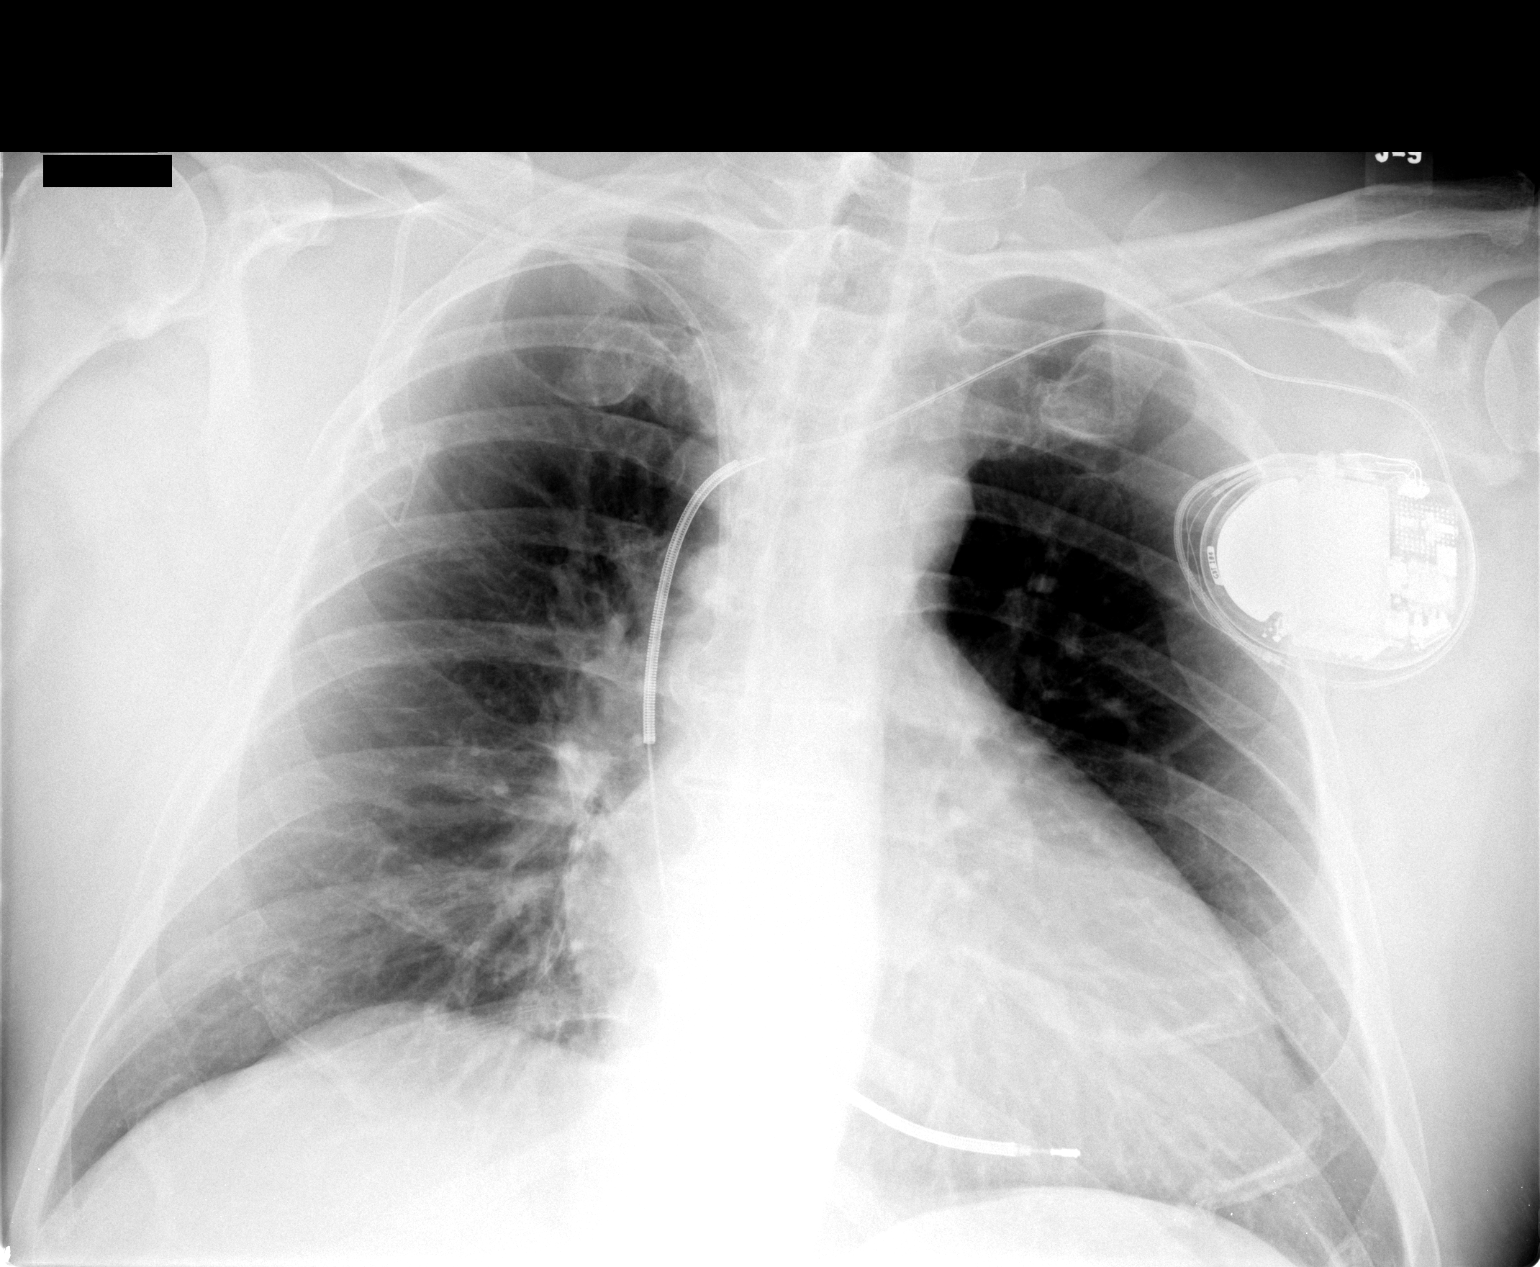

[1 of 1 positions shown; findings below may reference images not displayed]

FINDINGS: Right-sided Port-A-Cath terminates at the mid SVC. No pneumothorax.
Single lead pacer/AICD device is unchanged in position.

Midline trachea. Moderate cardiomegaly. Sharp costophrenic angles. Left
costophrenic angle partially excluded. Clear lungs.

IMPRESSION

1. Right-sided Port-A-Cath appropriately positioned without pneumothorax.
2. Cardiomegaly without congestive failure or acute cardiac pulmonary disease.

## 2008-10-21 ENCOUNTER — Observation Stay (HOSPITAL_COMMUNITY): Admission: EM | Admit: 2008-10-21 | Discharge: 2008-10-22 | Payer: Self-pay | Admitting: Emergency Medicine

## 2008-10-28 ENCOUNTER — Ambulatory Visit: Payer: Self-pay | Admitting: Internal Medicine

## 2008-11-12 ENCOUNTER — Inpatient Hospital Stay (HOSPITAL_COMMUNITY): Admission: EM | Admit: 2008-11-12 | Discharge: 2008-11-15 | Payer: Self-pay | Admitting: *Deleted

## 2008-11-12 ENCOUNTER — Ambulatory Visit: Payer: Self-pay | Admitting: Internal Medicine

## 2008-11-22 ENCOUNTER — Ambulatory Visit: Payer: Self-pay | Admitting: Oncology

## 2008-11-27 ENCOUNTER — Emergency Department (HOSPITAL_COMMUNITY): Admission: EM | Admit: 2008-11-27 | Discharge: 2008-11-27 | Payer: Self-pay | Admitting: Emergency Medicine

## 2008-11-29 ENCOUNTER — Inpatient Hospital Stay (HOSPITAL_COMMUNITY): Admission: EM | Admit: 2008-11-29 | Discharge: 2008-12-09 | Payer: Self-pay | Admitting: Emergency Medicine

## 2008-12-23 ENCOUNTER — Encounter (HOSPITAL_COMMUNITY): Admission: RE | Admit: 2008-12-23 | Discharge: 2009-01-22 | Payer: Self-pay | Admitting: Cardiovascular Disease

## 2009-01-08 ENCOUNTER — Inpatient Hospital Stay (HOSPITAL_COMMUNITY): Admission: EM | Admit: 2009-01-08 | Discharge: 2009-01-09 | Payer: Self-pay | Admitting: Emergency Medicine

## 2009-01-24 ENCOUNTER — Encounter (HOSPITAL_COMMUNITY): Admission: RE | Admit: 2009-01-24 | Discharge: 2009-02-23 | Payer: Self-pay | Admitting: Cardiovascular Disease

## 2009-02-06 ENCOUNTER — Inpatient Hospital Stay (HOSPITAL_COMMUNITY): Admission: EM | Admit: 2009-02-06 | Discharge: 2009-02-07 | Payer: Self-pay | Admitting: *Deleted

## 2009-02-12 IMAGING — CR DG CHEST 1V PORT
1 series · 1 of 1 positions shown · non-contrast
Comparison: 06/08/07.

CLINICAL DATA: Rectal cancer.  
 PORTABLE CHEST ? 1 VIEW:

[view not recorded]
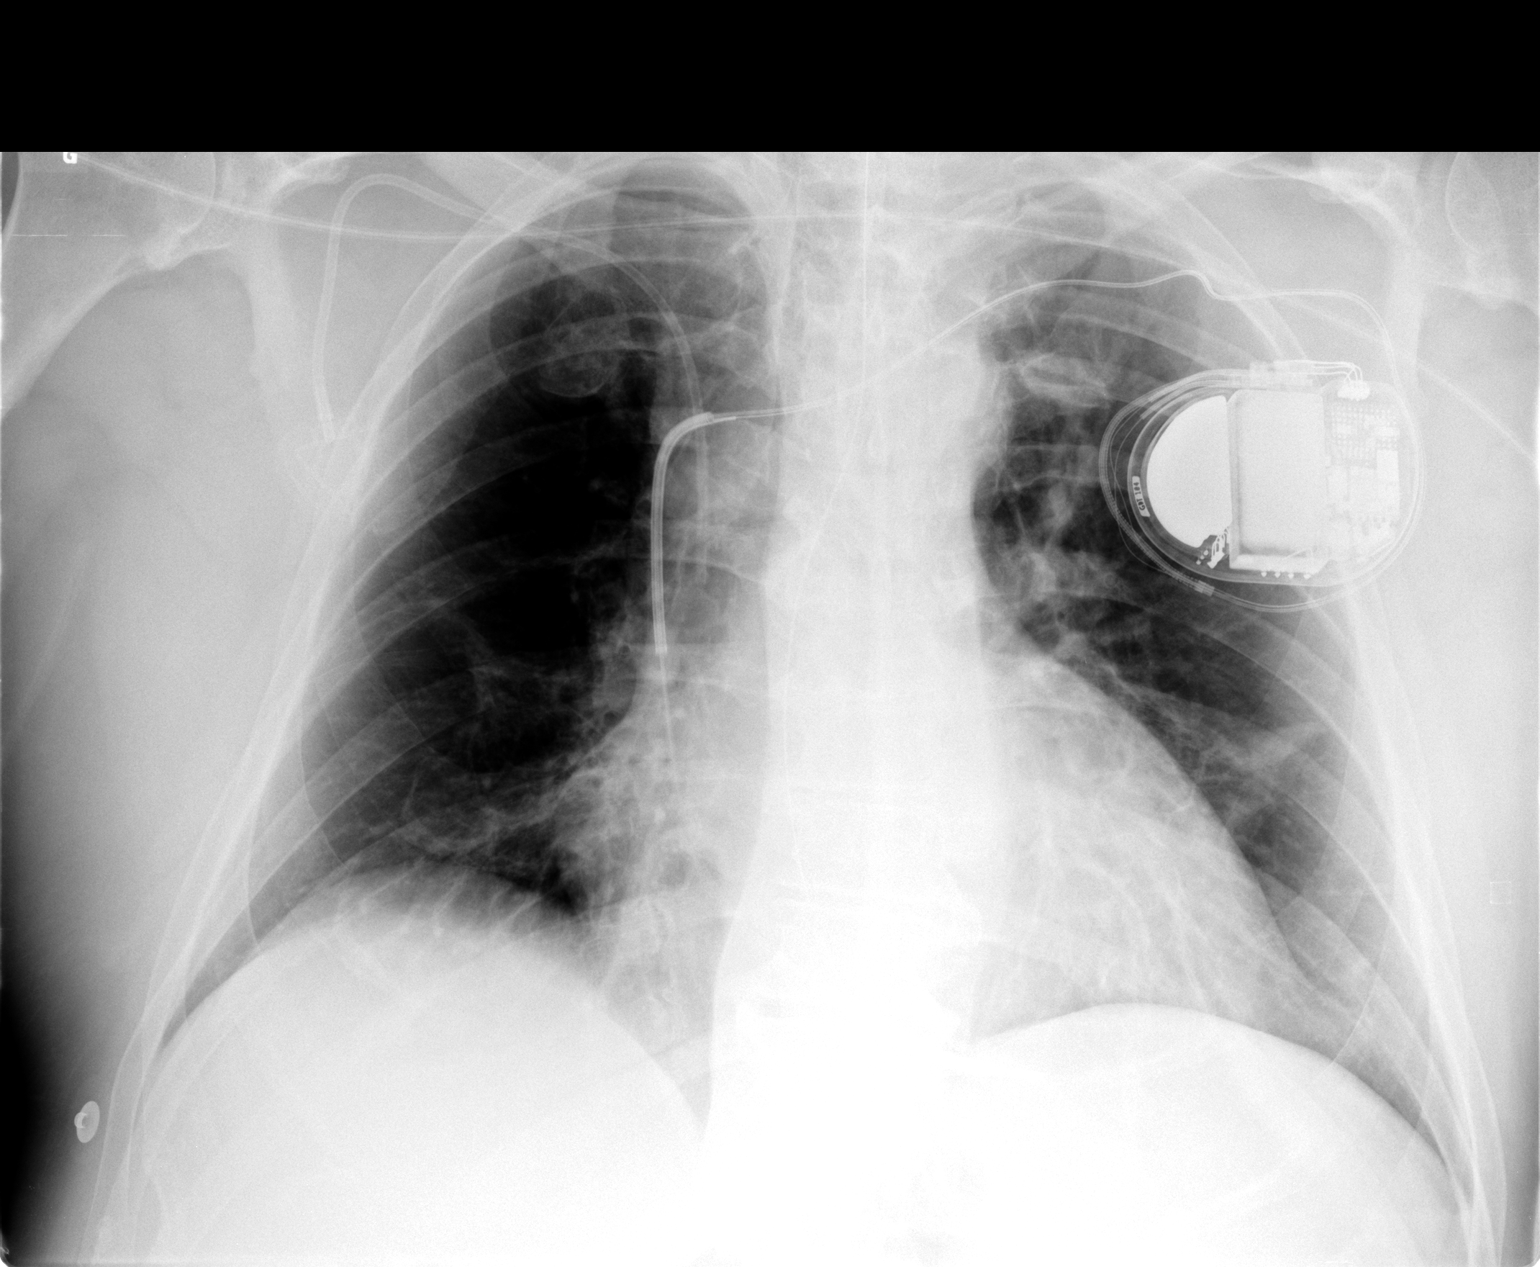

[1 of 1 positions shown; findings below may reference images not displayed]

FINDINGS: The patient has a right subclavian central venous catheter again noted.  The patient has a new NG tube with the tip just below the gastroesophageal junction.  There is scattered atelectasis, most notable in the left mid lung.  Heart size is upper normal.
IMPRESSION: 1. Mild scattered atelectasis.  
 2. NG tube with its tip just below the gastroesophageal junction.

## 2009-02-22 IMAGING — CR DG ABDOMEN 2V
3 series · 3 of 3 positions shown · non-contrast
Comparison: none

CLINICAL DATA: Abdominal pain, colonoscopy last week. 
 ABDOMEN ? 2 VIEW:

[w abdomen upright]
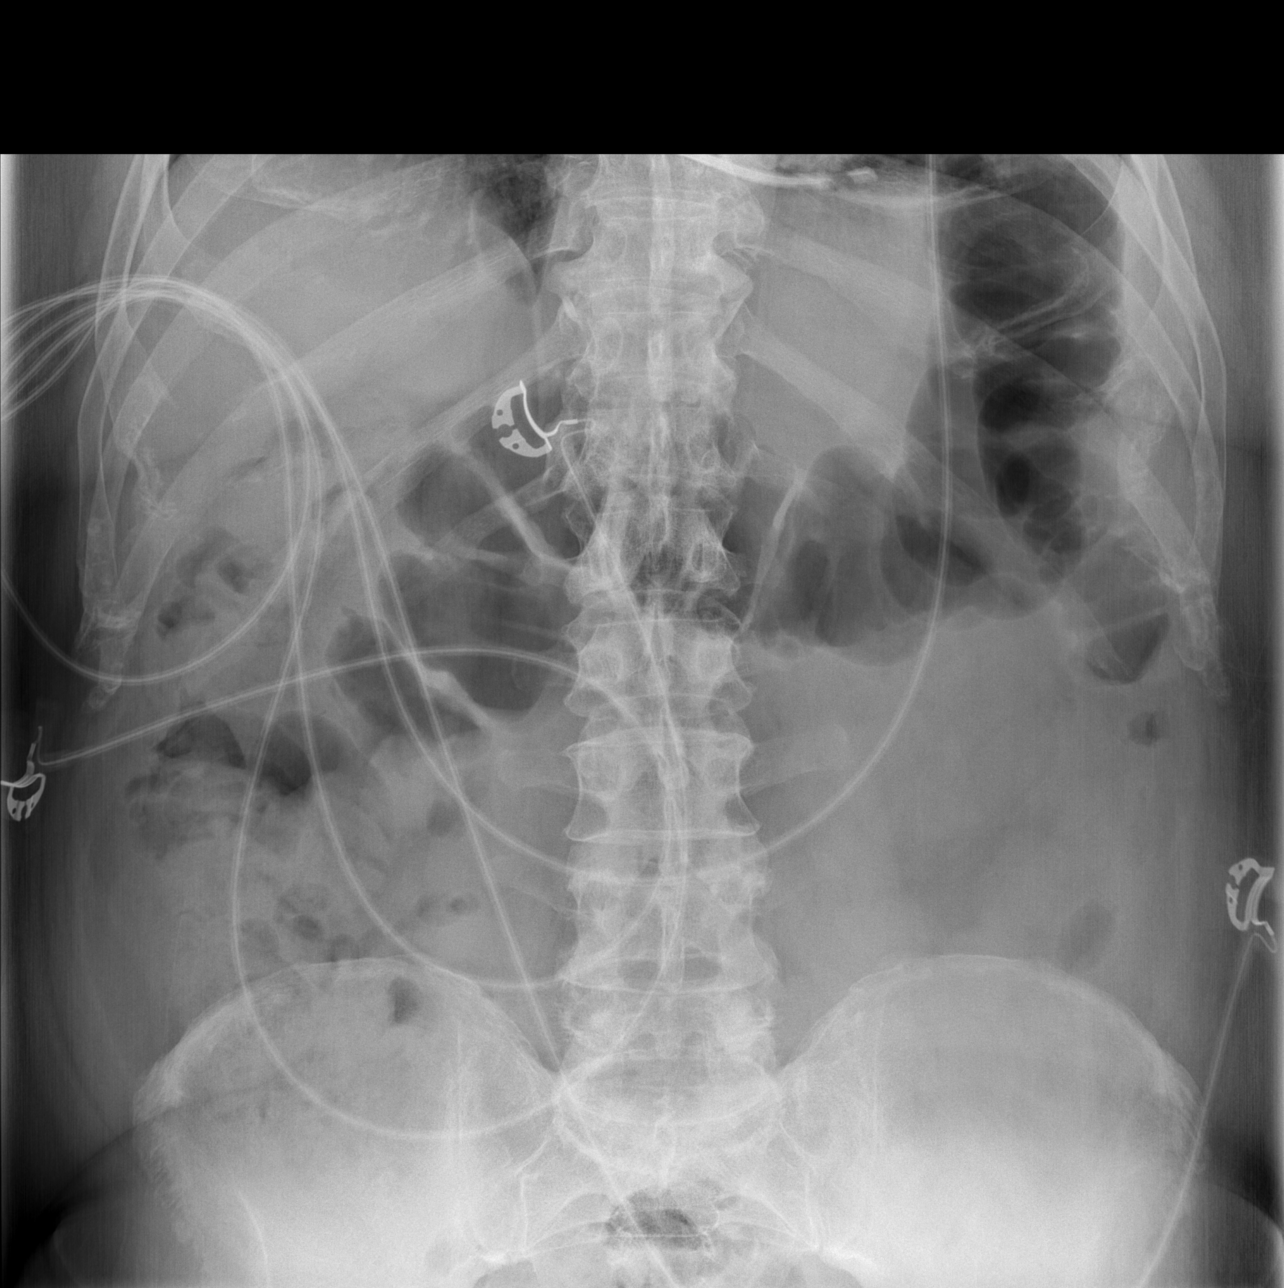

[t abdomen supine (1 of 2)]
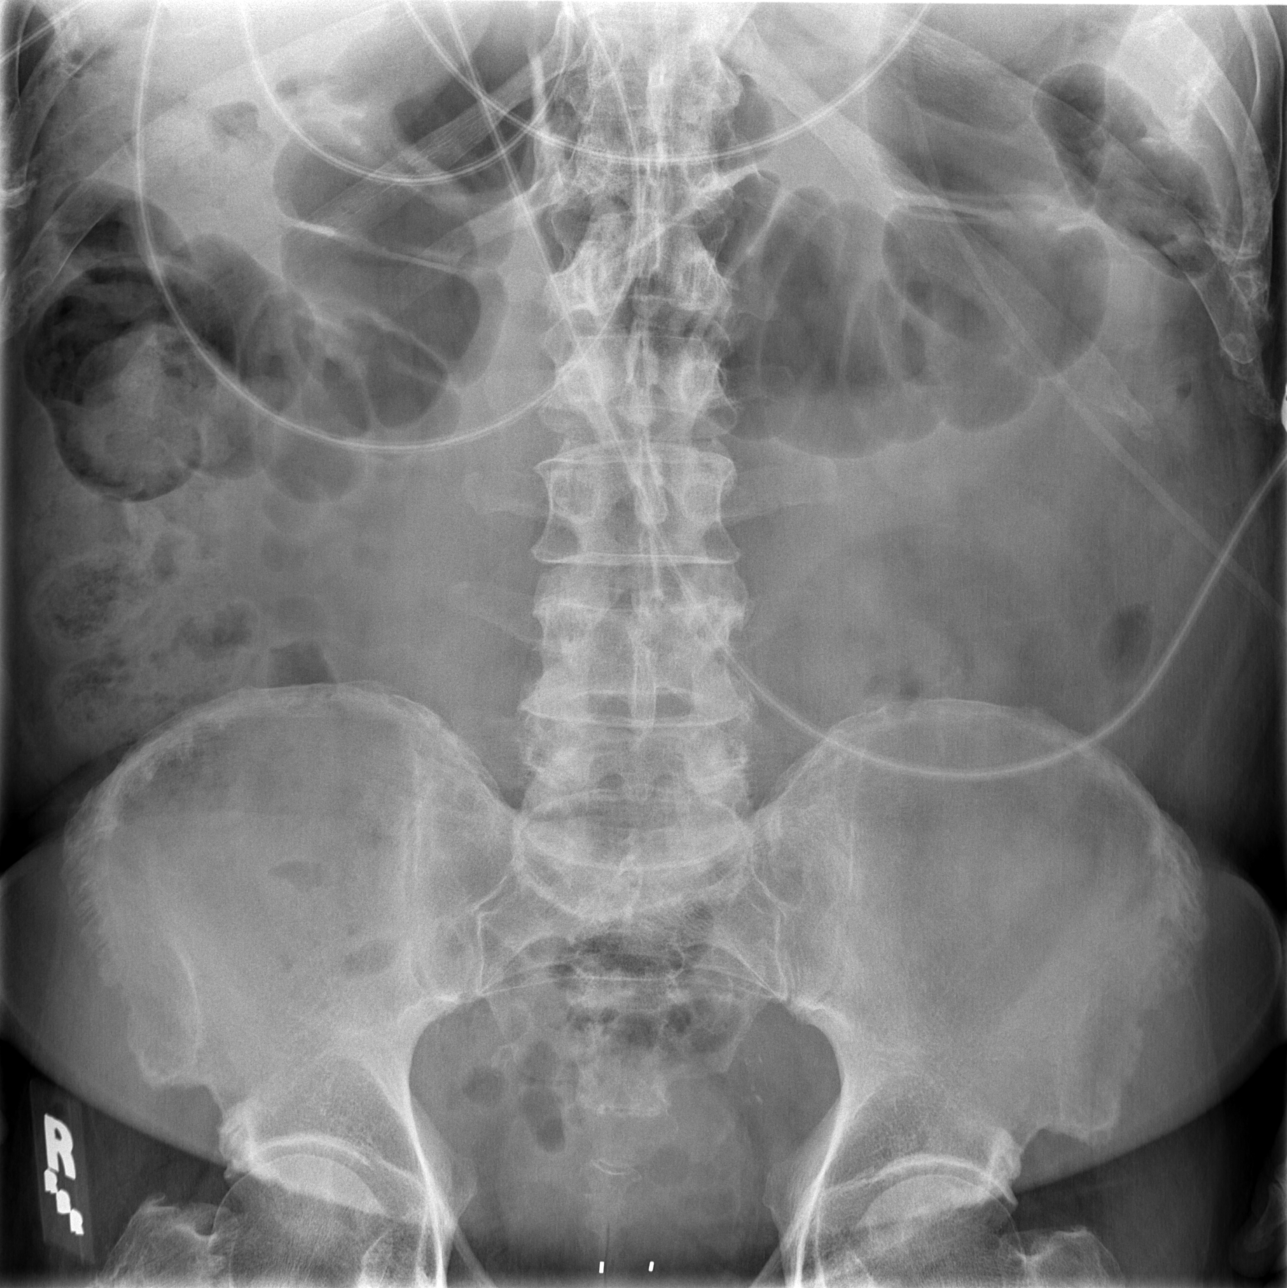

[t abdomen supine (2 of 2)]
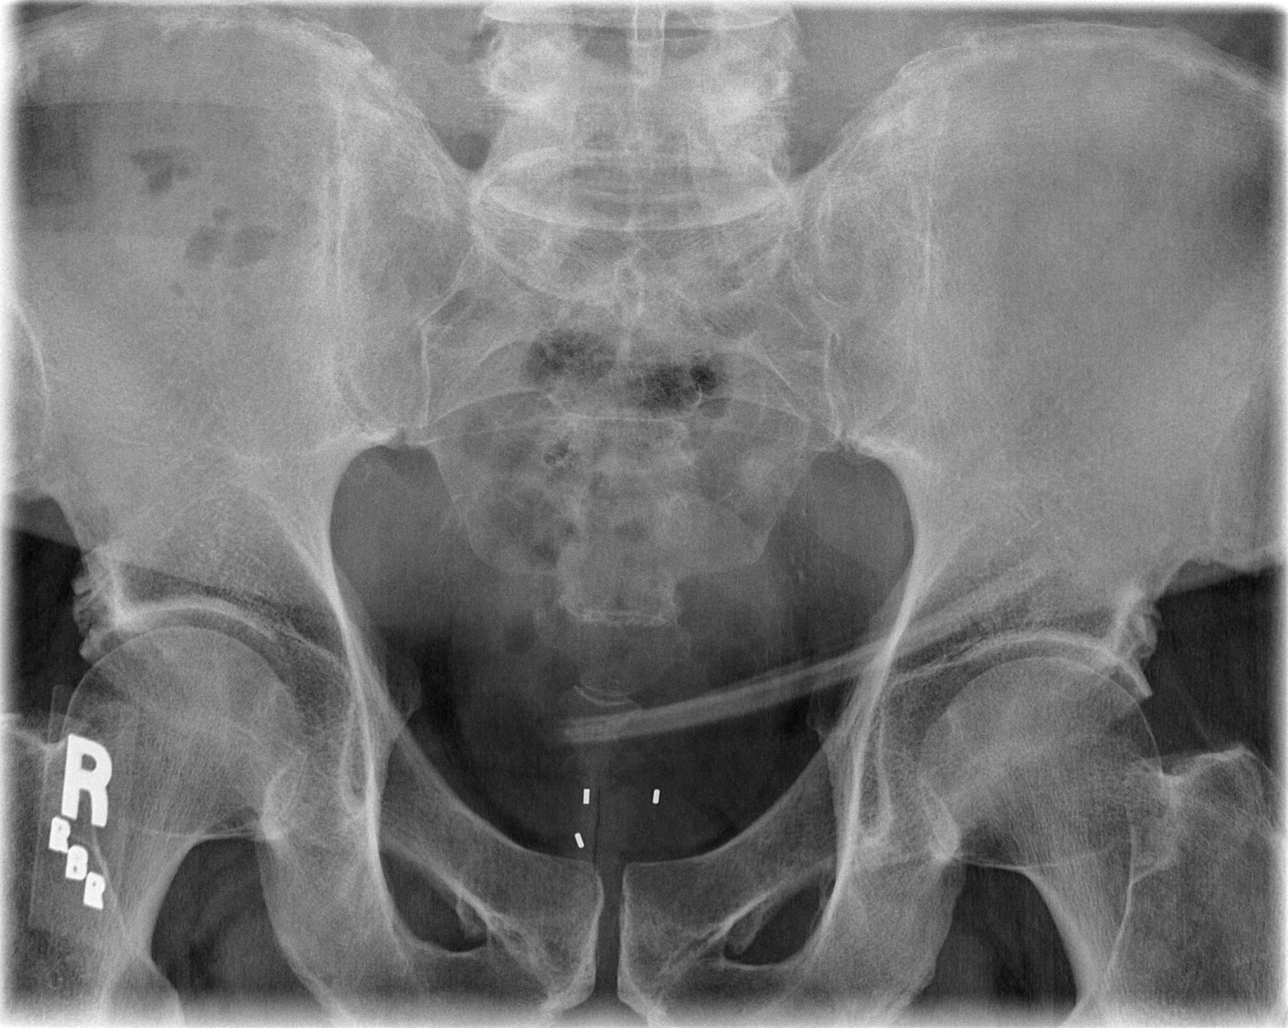

[3 of 3 positions shown; findings below may reference images not displayed]

FINDINGS: Small bowel gas pattern is normal.  There is some gas in the transverse colon but no colonic dilatation.  No sign of free air.  Three markers overlie the region of the lower pelvis.  There is a linear radiopacity overlying the lower pelvic which may be external to the patient an artifactual.  The bony structures are unremarkable.
IMPRESSION: Unremarkable gas pattern.  See above.

## 2009-02-24 ENCOUNTER — Encounter (HOSPITAL_COMMUNITY): Admission: RE | Admit: 2009-02-24 | Discharge: 2009-03-26 | Payer: Self-pay | Admitting: Cardiovascular Disease

## 2009-03-09 ENCOUNTER — Emergency Department (HOSPITAL_COMMUNITY): Admission: EM | Admit: 2009-03-09 | Discharge: 2009-03-09 | Payer: Self-pay | Admitting: Emergency Medicine

## 2009-03-27 ENCOUNTER — Encounter (HOSPITAL_COMMUNITY): Admission: RE | Admit: 2009-03-27 | Discharge: 2009-04-26 | Payer: Self-pay | Admitting: Cardiovascular Disease

## 2009-04-15 ENCOUNTER — Encounter: Payer: Self-pay | Admitting: Internal Medicine

## 2009-04-30 ENCOUNTER — Encounter: Payer: Self-pay | Admitting: Internal Medicine

## 2009-05-15 ENCOUNTER — Ambulatory Visit (HOSPITAL_COMMUNITY): Admission: RE | Admit: 2009-05-15 | Discharge: 2009-05-15 | Payer: Self-pay | Admitting: Family Medicine

## 2009-05-22 ENCOUNTER — Ambulatory Visit: Payer: Self-pay | Admitting: Oncology

## 2009-05-26 ENCOUNTER — Ambulatory Visit (HOSPITAL_COMMUNITY): Admission: RE | Admit: 2009-05-26 | Discharge: 2009-05-26 | Payer: Self-pay | Admitting: Oncology

## 2009-05-26 LAB — CBC WITH DIFFERENTIAL/PLATELET
BASO%: 0.4 % (ref 0.0–2.0)
HCT: 42.3 % (ref 38.4–49.9)
MCHC: 35 g/dL (ref 32.0–36.0)
MONO#: 0.8 10*3/uL (ref 0.1–0.9)
NEUT%: 72 % (ref 39.0–75.0)
WBC: 6.6 10*3/uL (ref 4.0–10.3)
lymph#: 0.9 10*3/uL (ref 0.9–3.3)

## 2009-05-26 LAB — COMPREHENSIVE METABOLIC PANEL
ALT: 189 U/L — ABNORMAL HIGH (ref 0–53)
Albumin: 3.8 g/dL (ref 3.5–5.2)
CO2: 25 mEq/L (ref 19–32)
Calcium: 9.4 mg/dL (ref 8.4–10.5)
Chloride: 101 mEq/L (ref 96–112)
Creatinine, Ser: 1.45 mg/dL (ref 0.40–1.50)
Sodium: 136 mEq/L (ref 135–145)
Total Protein: 7.3 g/dL (ref 6.0–8.3)

## 2009-05-26 LAB — CEA: CEA: 0.9 ng/mL (ref 0.0–5.0)

## 2009-06-06 ENCOUNTER — Ambulatory Visit (HOSPITAL_COMMUNITY): Admission: RE | Admit: 2009-06-06 | Discharge: 2009-06-06 | Payer: Self-pay | Admitting: Oncology

## 2009-09-03 IMAGING — CR DG CHEST 1V PORT
1 series · 1 of 1 positions shown · non-contrast
Comparison: 10/04/2007

CLINICAL DATA: Arrhythmia

PORTABLE CHEST - 1 VIEW

[AP]
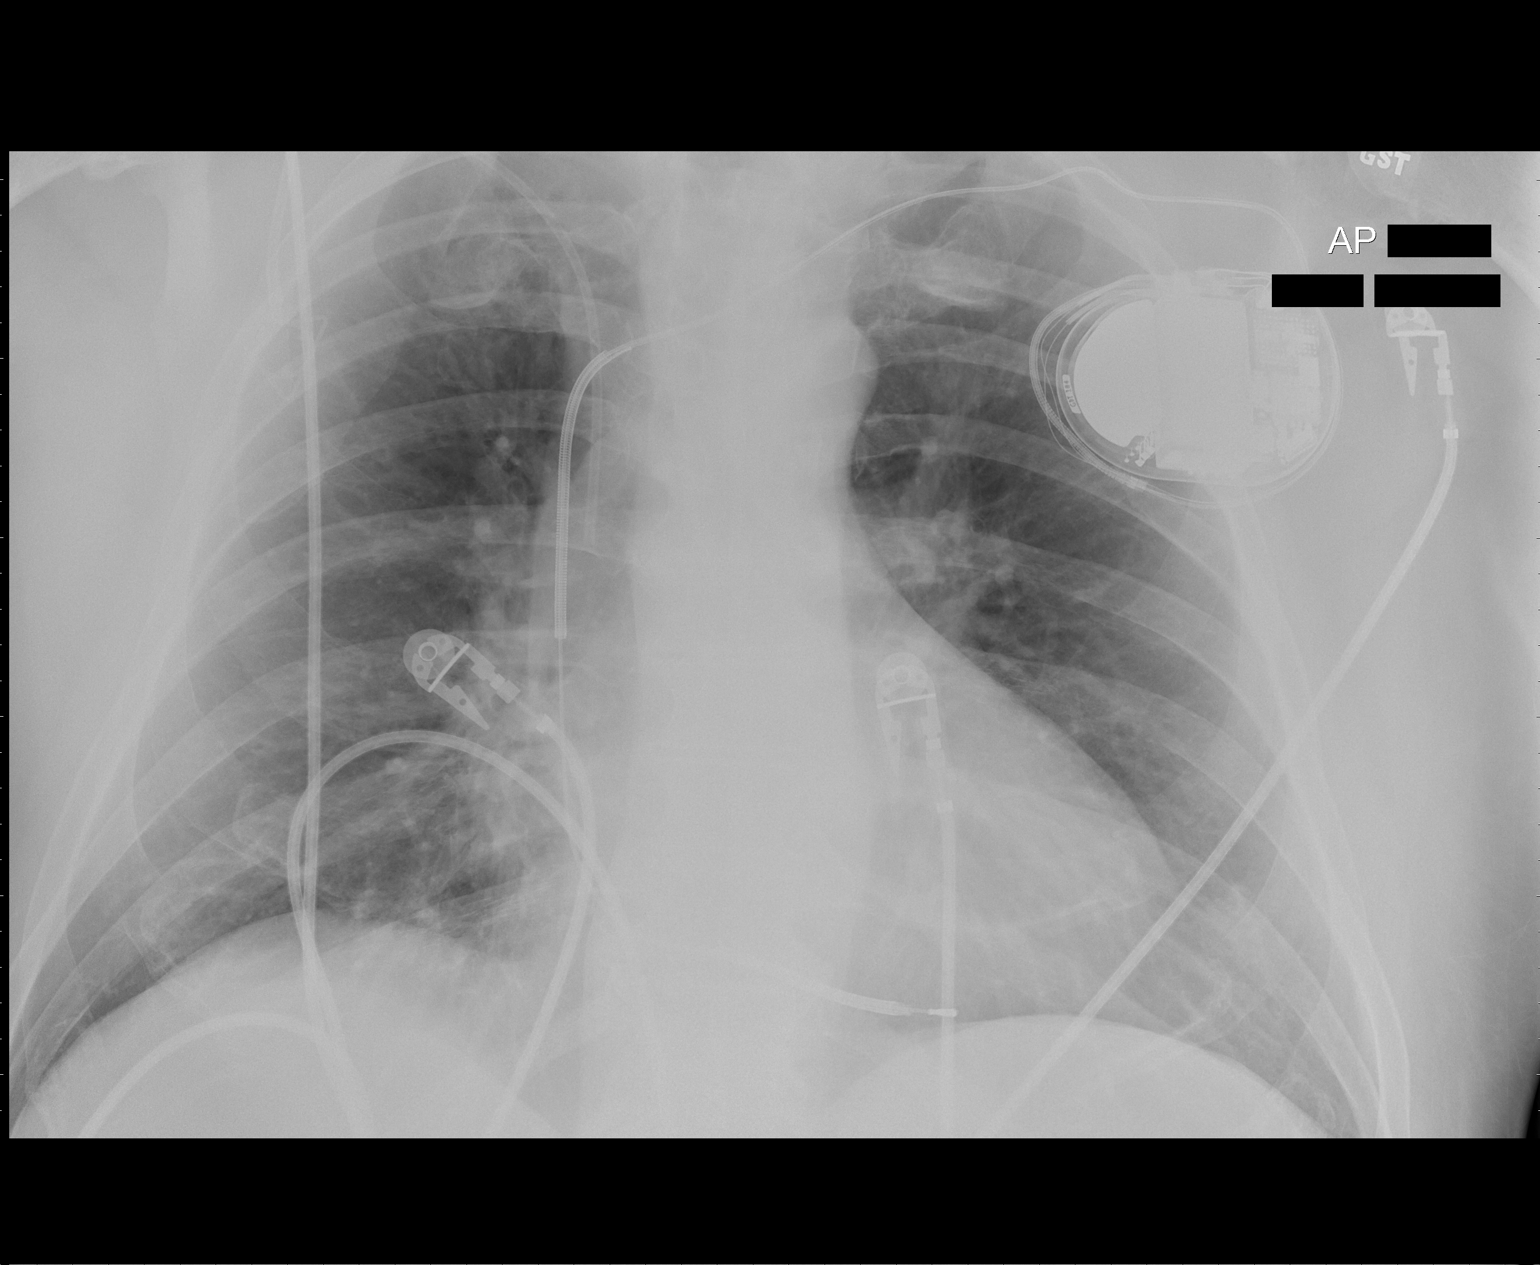

[1 of 1 positions shown; findings below may reference images not displayed]

FINDINGS: Single view of the chest demonstrates a right subclavian
Port-A-Cath with the tip in the SVC.  There is a left cardiac ICD.
The lungs are clear without pulmonary edema or focal airspace
disease.  The heart and mediastinum are stable.  The bony
structures are intact.
IMPRESSION: No acute chest findings.

## 2009-09-29 IMAGING — CT CT CHEST W/ CM
2 of 5 series · 16 of 46 positions shown, 18 images · IV contrast (agent unspecified)
Comparison: 05/26/2007

CT CHEST

Addendum Begins

The title of the exam below is CT CHEST AND ABDOMEN WITHOUT
CONTRAST.  This study was actually performed with intravenous
contrast and the title of the exam should read CT CHEST AND ABDOMEN
WITH CONTRAST.  The patient was given 100 ml Rmnipaque-V99 by bolus
injection prior to CT imaging.
Addendum Ends
CLINICAL DATA: Rectal cancer
CT CHEST AND ABDOMEN WITHOUT CONTRAST
TECHNIQUE: Multidetector CT imaging of the chest and abdomen was p
erformed following the standard protocol without intravenous contra
st.

[Series 2: ca 5.0 b40f · axial · 0.95mm/px · z∈[-487,-82]mm · 13 of 93 slices shown, 15 images]
[im 6/93  soft-tissue]
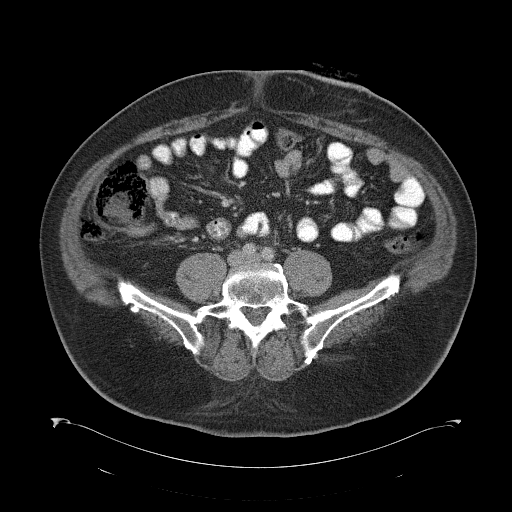
[im 6/93  bone]
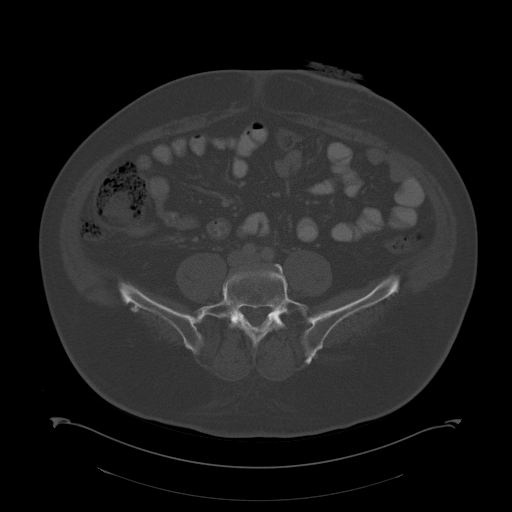
[im 12/93  soft-tissue]
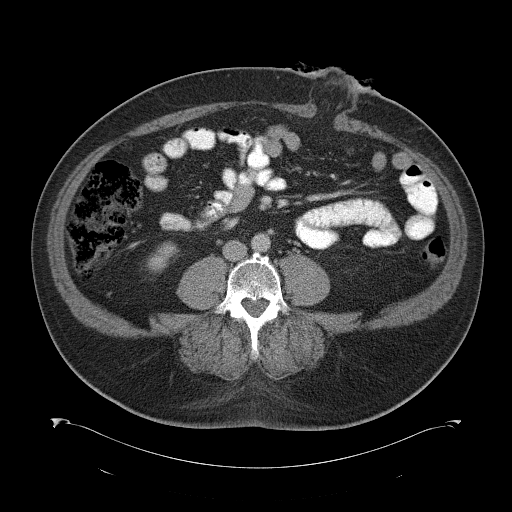
[im 18/93  soft-tissue]
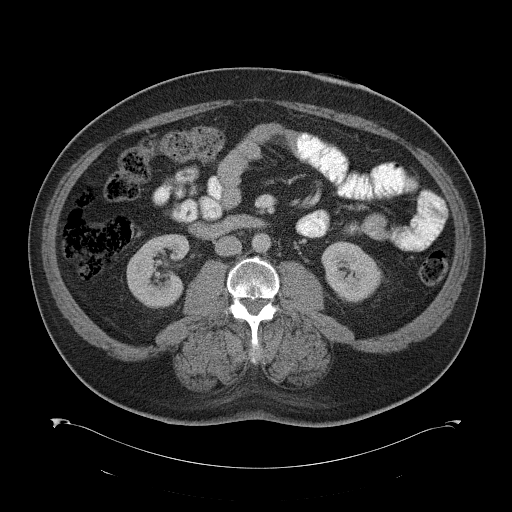
[im 29/93  soft-tissue]
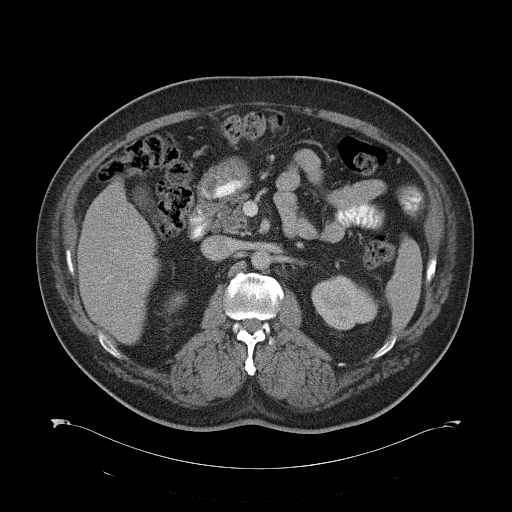
[im 35/93  soft-tissue]
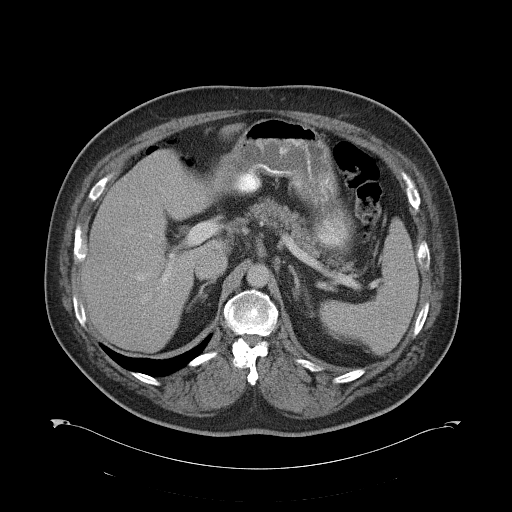
[im 41/93  soft-tissue]
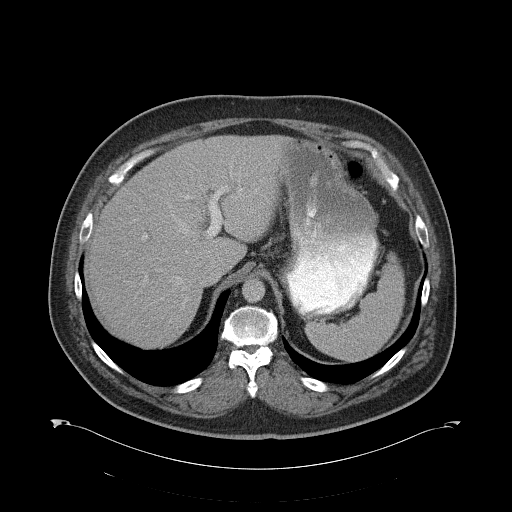
[im 47/93  soft-tissue]
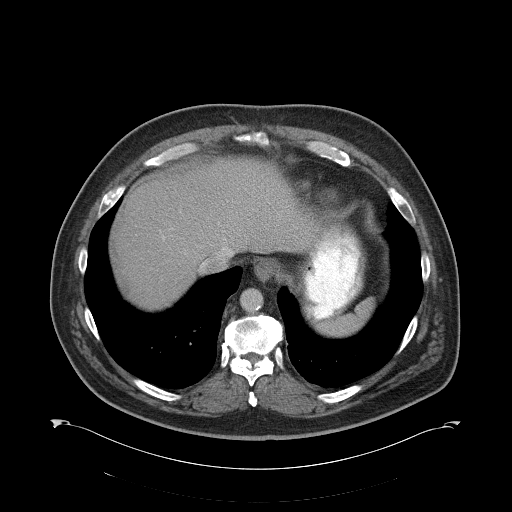
[im 52/93  soft-tissue]
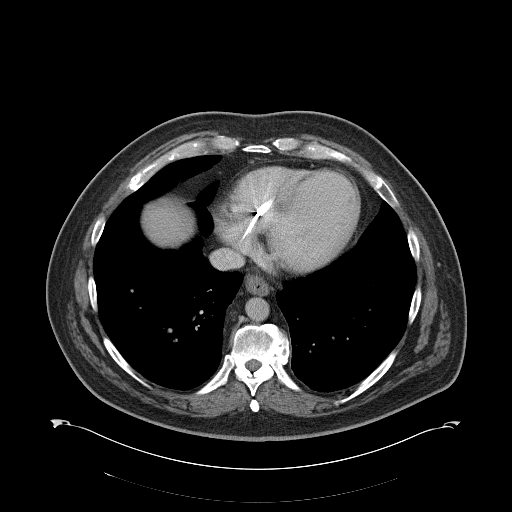
[im 58/93  soft-tissue]
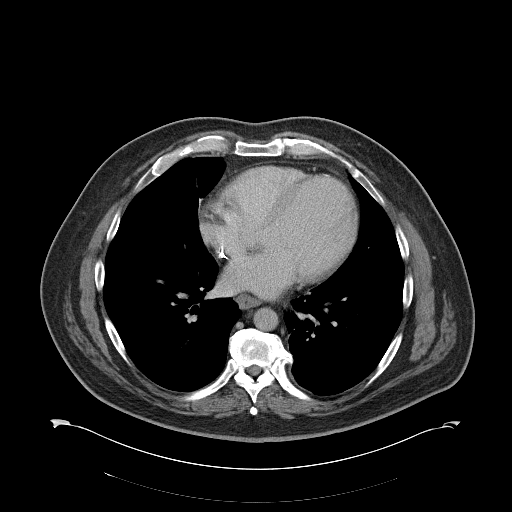
[im 58/93  bone]
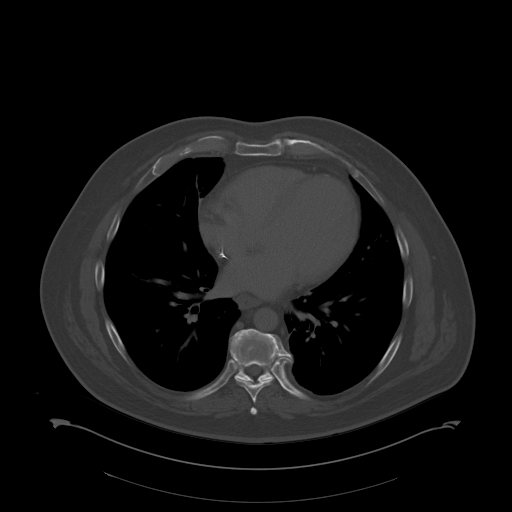
[im 64/93  soft-tissue]
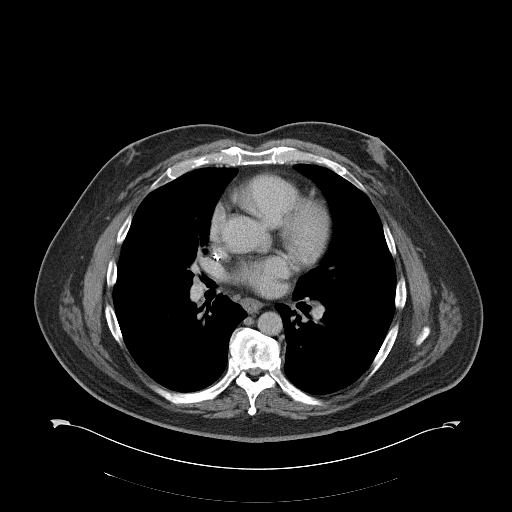
[im 75/93  soft-tissue]
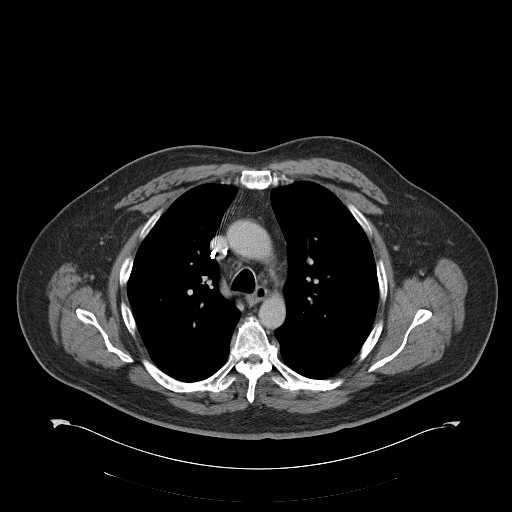
[im 81/93  soft-tissue]
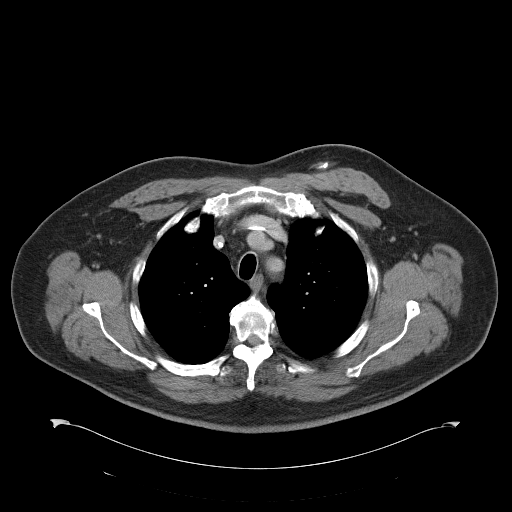
[im 87/93  soft-tissue]
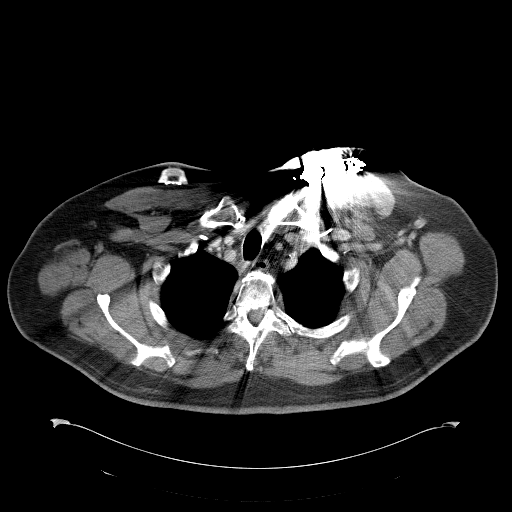

[Series 602: coronal images · coronal · 0.95mm/px · 3 of 95 slices shown]
[im 32/95  soft-tissue]
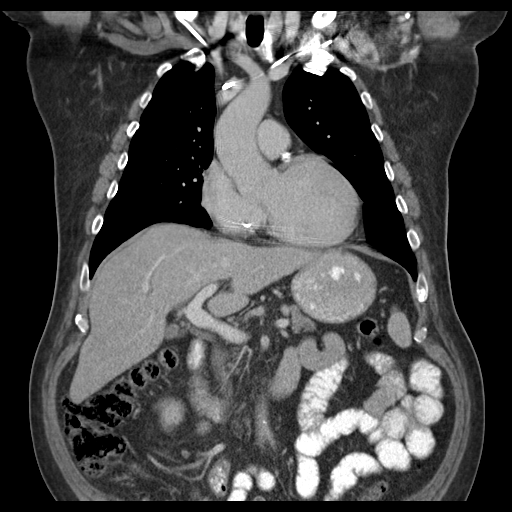
[im 42/95  soft-tissue]
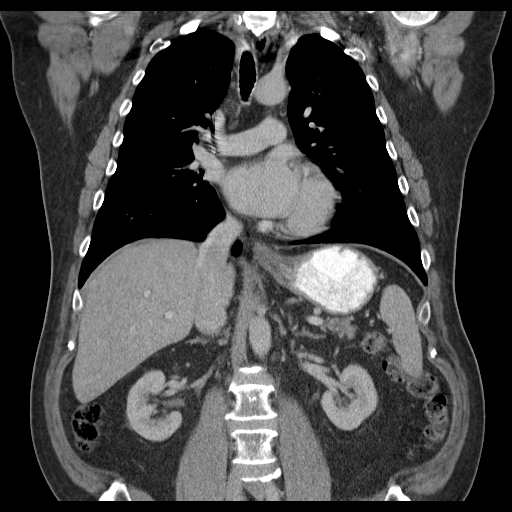
[im 53/95  soft-tissue]
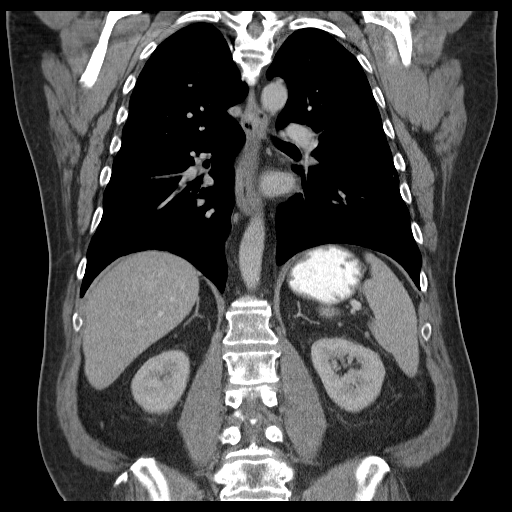

[16 of 46 positions shown; findings below may reference images not displayed]

FINDINGS: Left-sided single lead pacer / AICD noted with lead tip
positioned in the right ventricular apex.  The patient is noted to
have several tiny foci of endocardial or myocardial calcification
in the region of the left ventricular apex.  While nonspecific,
endocardial fibrosis or areas of infarct could have this
appearance.  This does not represent a diffuse or confluent layer
of calcification as typically seen with left ventricular aneurysm.
The features are unchanged in the 1 year interval since the prior
study.  A right Port-A-Cath is in place with the tip at the mid SVC
level.

There is no axillary, mediastinal, or hilar lymphadenopathy.  The
heart is at upper limits of normal for size.  There is no
pericardial or pleural effusion.

Lung windows show no focal airspace consolidation.  There is no
parenchymal lung mass.  A tiny right middle lobe pulmonary nodule
on image 29 is unchanged.  A tiny calcified granuloma in the left
major fissure on image 32 is also stable.

Bone windows show no worrisome lytic or sclerotic osseous lesions.
IMPRESSION: Stable exam.  No CT evidence for metastatic disease in the chest.

CT ABDOMEN
FINDINGS: The liver, spleen, stomach, duodenum, pancreas,
gallbladder, adrenal glands, and kidneys have normal imaging
features.  There is no abdominal lymphadenopathy.  No
intraperitoneal free fluid.  No abdominal aortic aneurysm.

An ostomy is seen in the left anterior abdominal wall.

Bone windows show no focal lytic or sclerotic osseous lesions.
IMPRESSION: Stable exam.  No evidence for metastatic disease in the abdomen.,

## 2009-11-14 ENCOUNTER — Ambulatory Visit: Payer: Self-pay | Admitting: Oncology

## 2009-11-18 LAB — COMPREHENSIVE METABOLIC PANEL
ALT: 101 U/L — ABNORMAL HIGH (ref 0–53)
BUN: 21 mg/dL (ref 6–23)
CO2: 23 mEq/L (ref 19–32)
Calcium: 9.4 mg/dL (ref 8.4–10.5)
Chloride: 107 mEq/L (ref 96–112)
Creatinine, Ser: 1.12 mg/dL (ref 0.40–1.50)

## 2009-11-18 LAB — CEA: CEA: 0.8 ng/mL (ref 0.0–5.0)

## 2009-11-26 ENCOUNTER — Emergency Department (HOSPITAL_COMMUNITY): Admission: EM | Admit: 2009-11-26 | Discharge: 2009-11-26 | Payer: Self-pay | Admitting: Emergency Medicine

## 2010-02-01 IMAGING — CR DG CHEST 1V PORT
2 series · 2 of 2 positions shown · non-contrast
Comparison: Chest radiograph 04/24/2008 chest CT [DATE]

CLINICAL DATA: Follow-up chest radiograh.  Pacemaker firing

PORTABLE CHEST - 1 VIEW

[AP (1 of 2)]
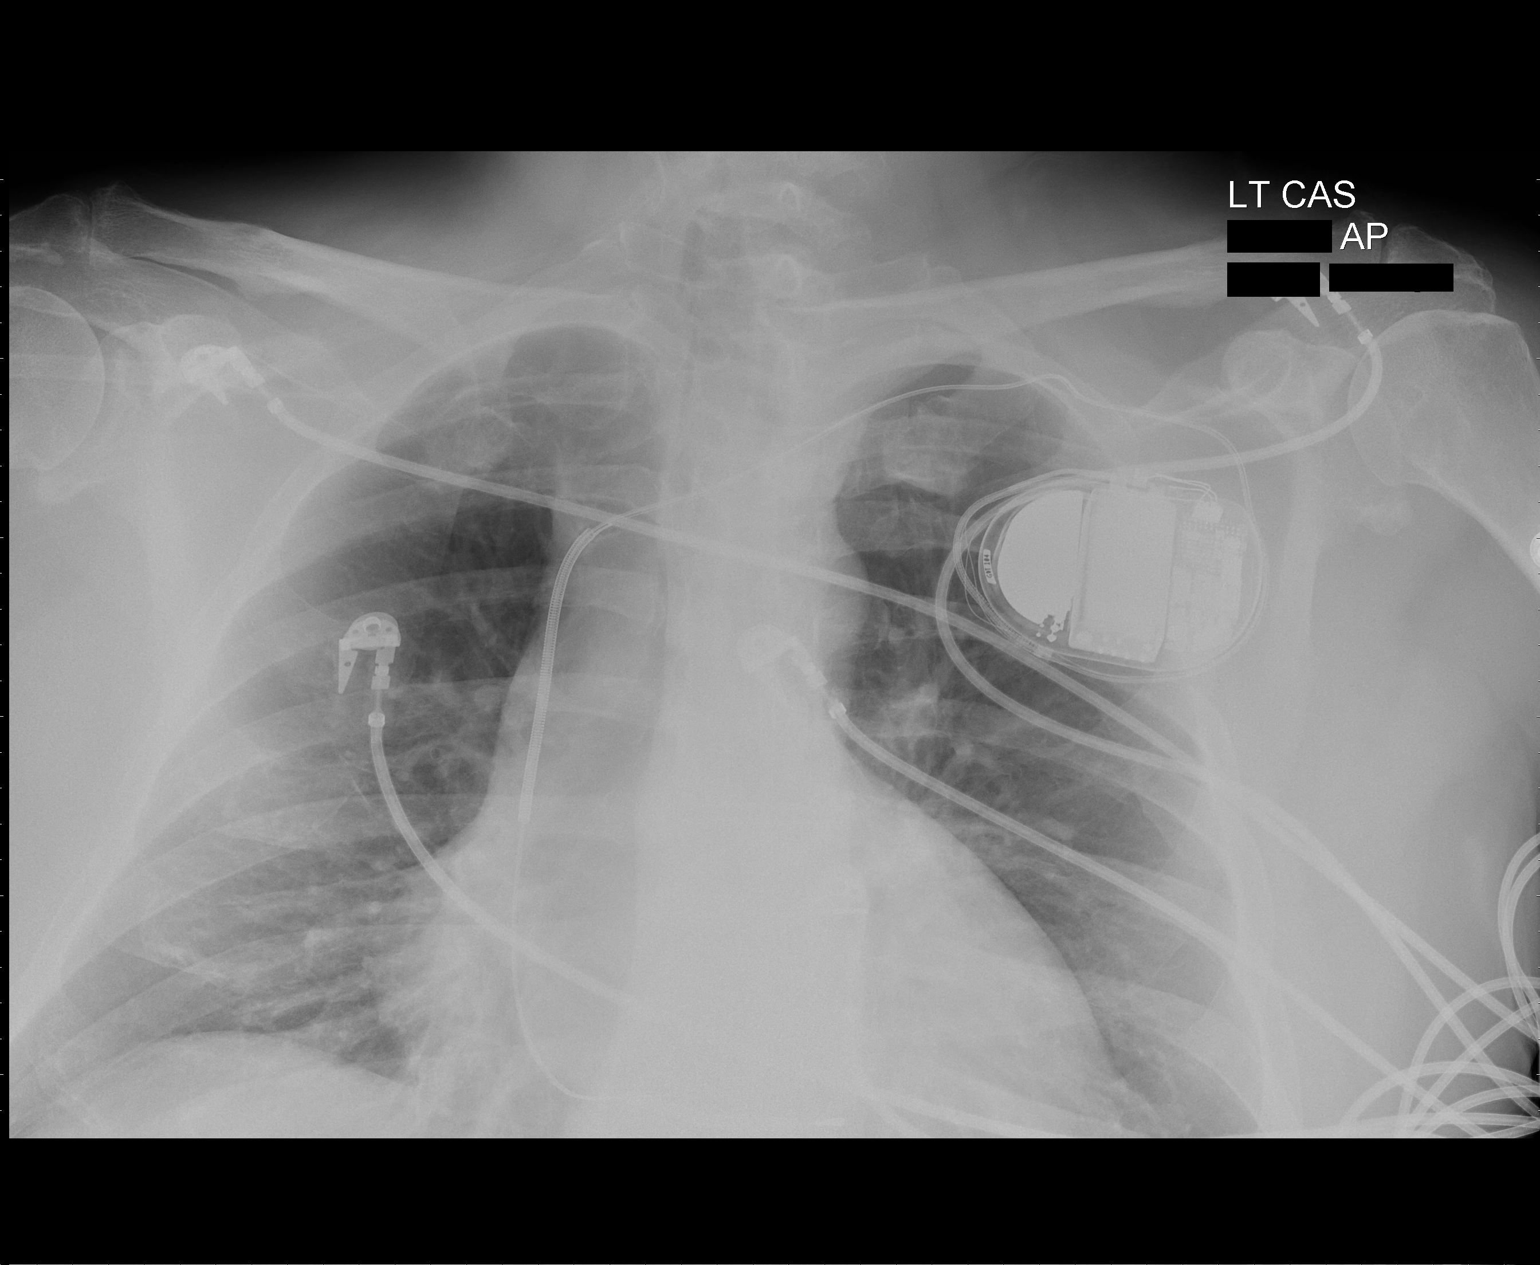

[AP (2 of 2)]
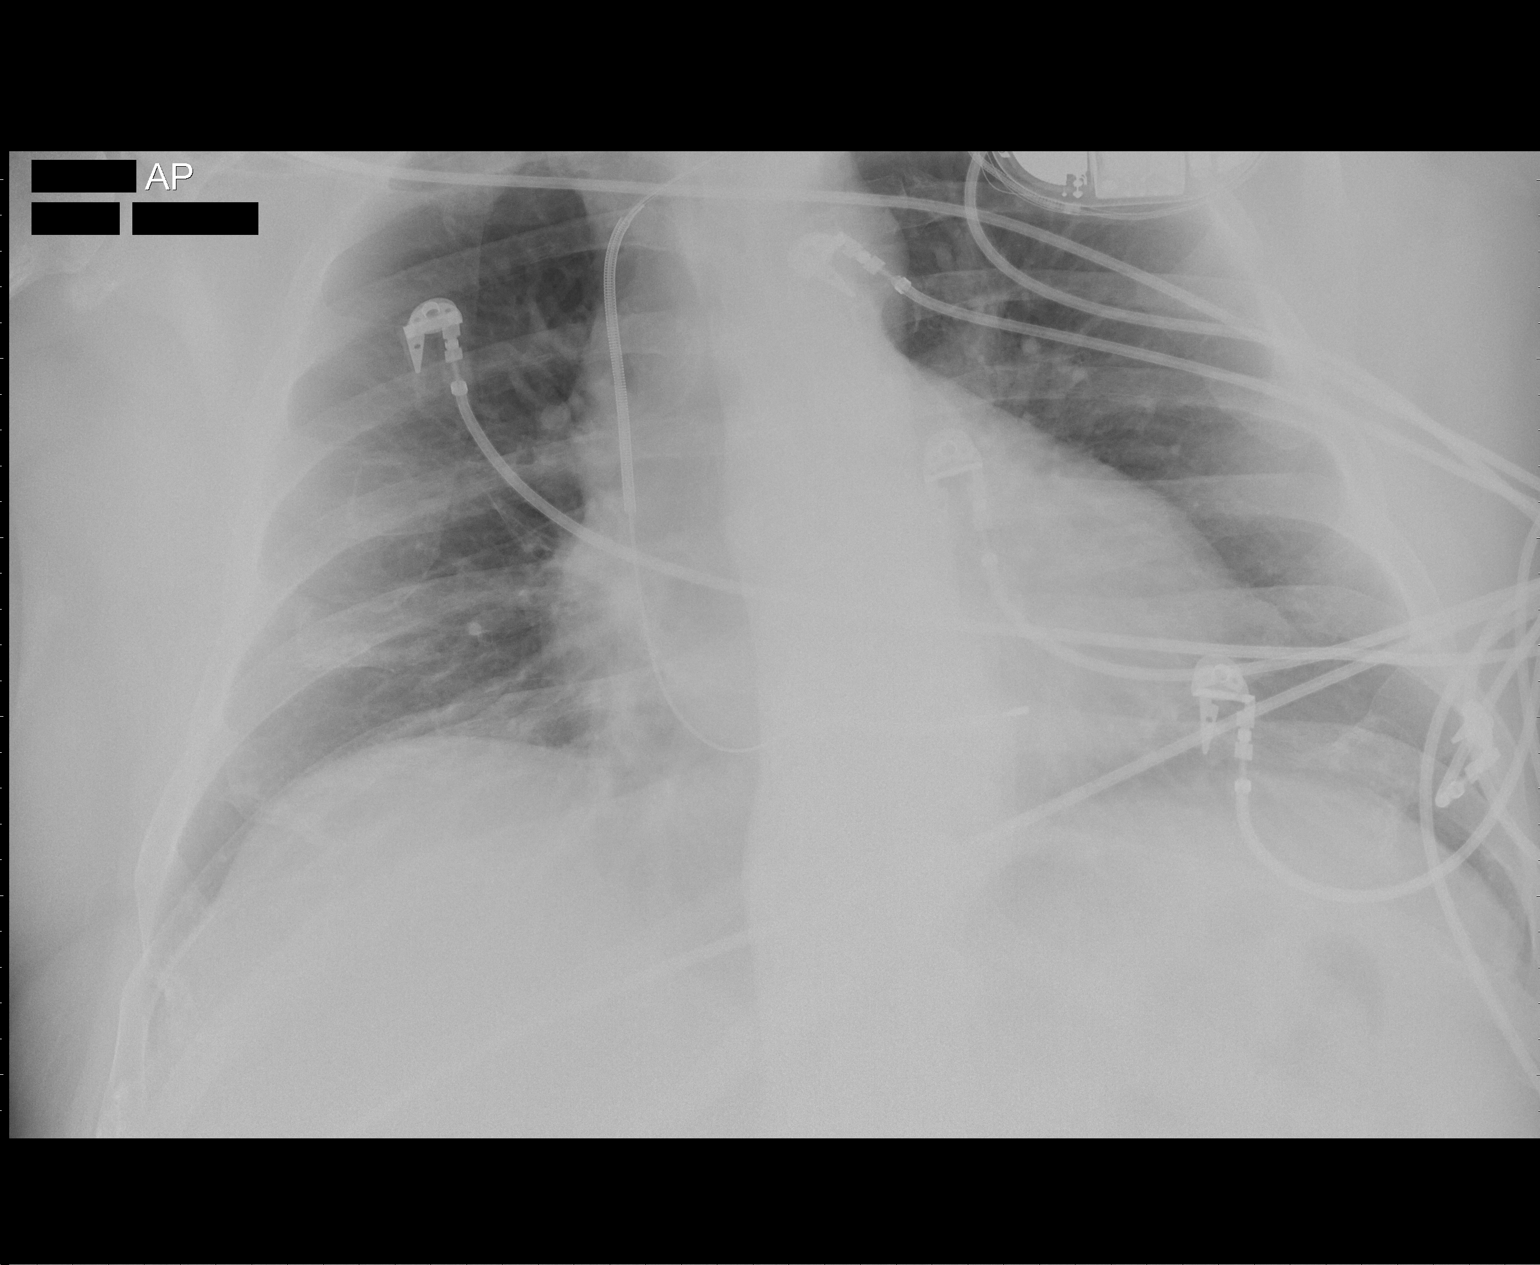

[2 of 2 positions shown; findings below may reference images not displayed]

FINDINGS: A left subclavian AICD is present with the distal tip
projecting over the expected location of the right ventricle.
Heart size is stable.  Mild pulmonary venous congestion without
evidence of edema.  The costophrenic angles are clear.  No focal
airspace opacities identified.
IMPRESSION: Mild pulmonary venous congestion.

## 2010-02-06 IMAGING — CR DG CHEST 1V PORT
1 series · 1 of 1 positions shown · non-contrast
Comparison: Portable exam 4067 hours compared to 09/22/2008

CLINICAL DATA: Pacemaker firing, chest pain, past history colon and
prostate cancer

PORTABLE CHEST - 1 VIEW

[AP]
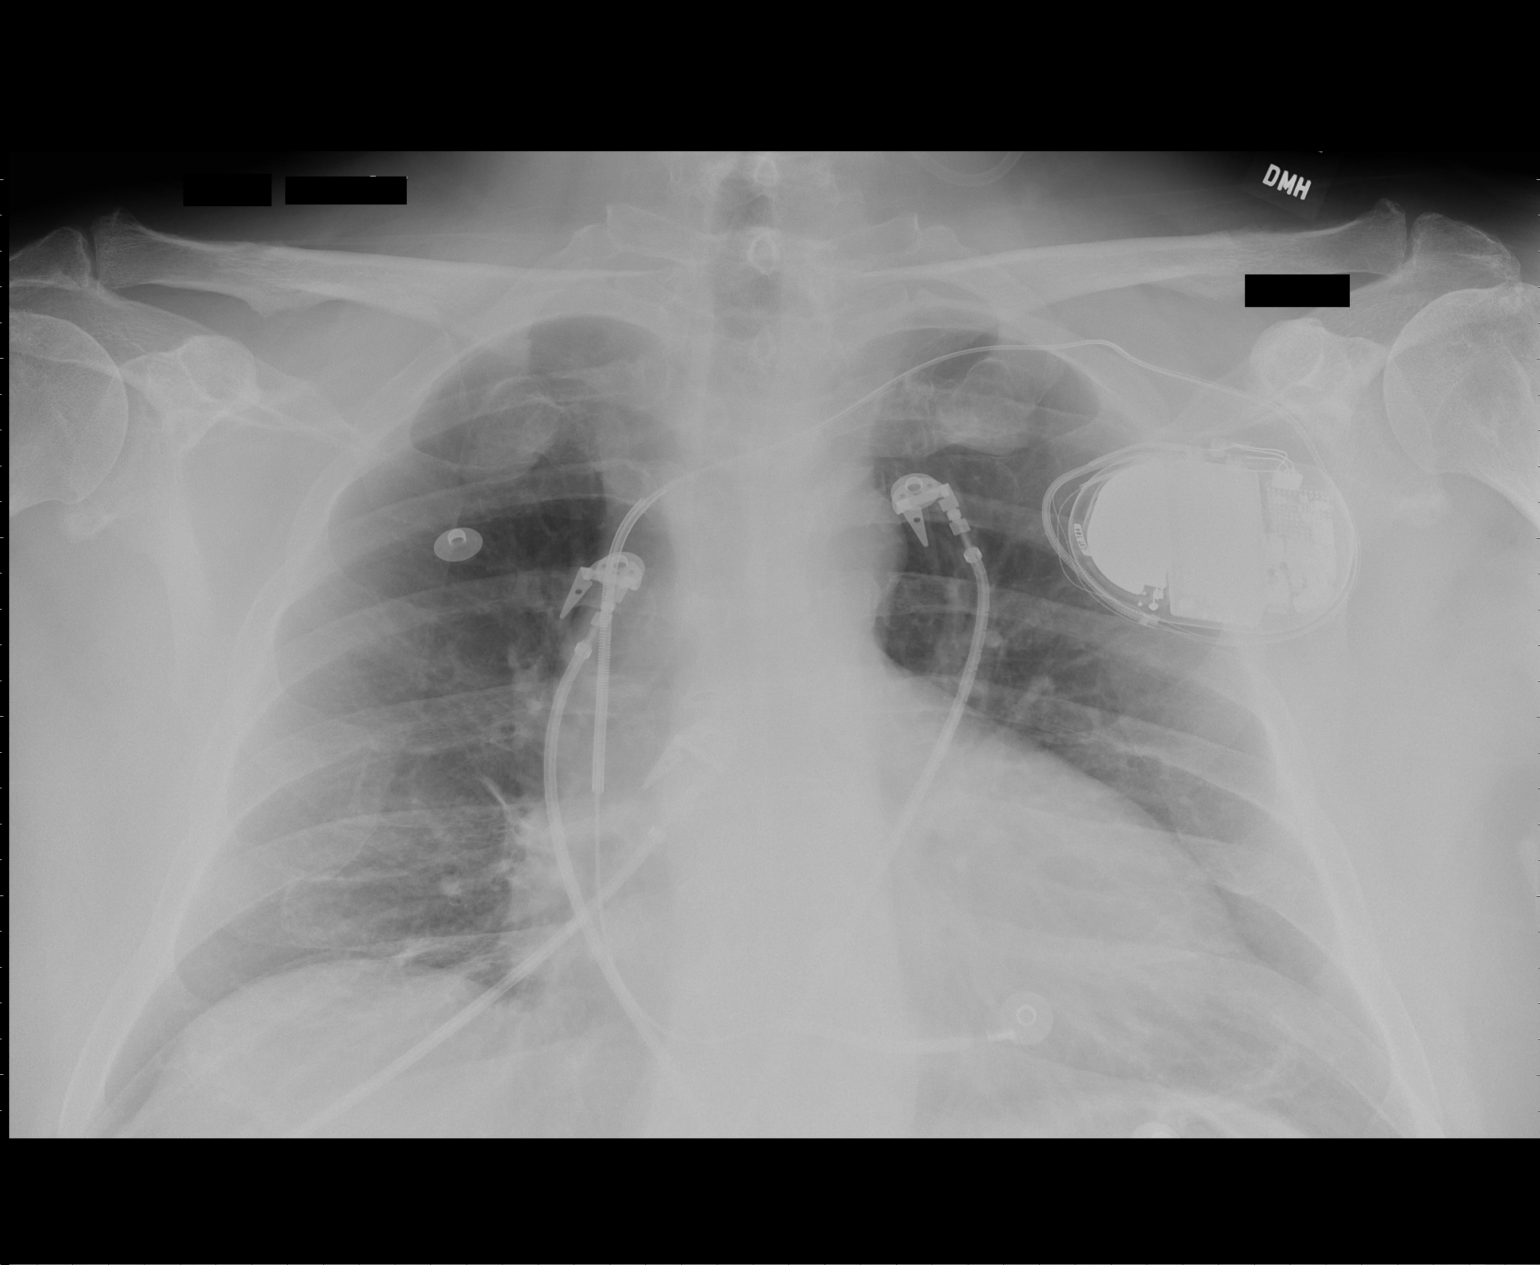

[1 of 1 positions shown; findings below may reference images not displayed]

FINDINGS: Cardiac enlargement stable.
Left subclavian transvenous pacemaker lead projects over right
ventricle.
Mediastinal contours and pulmonary vascularity normal.
Decreased right basilar atelectasis.
No acute infiltrate or effusion.
IMPRESSION: Cardiomegaly with pacemaker.
Decreased right basilar atelectasis.

## 2010-02-10 IMAGING — CR DG CHEST 1V PORT
1 series · 1 of 1 positions shown · non-contrast
Comparison: 09/27/2008

CLINICAL DATA: Status post pacer placement

PORTABLE CHEST - 1 VIEW

[view not recorded]
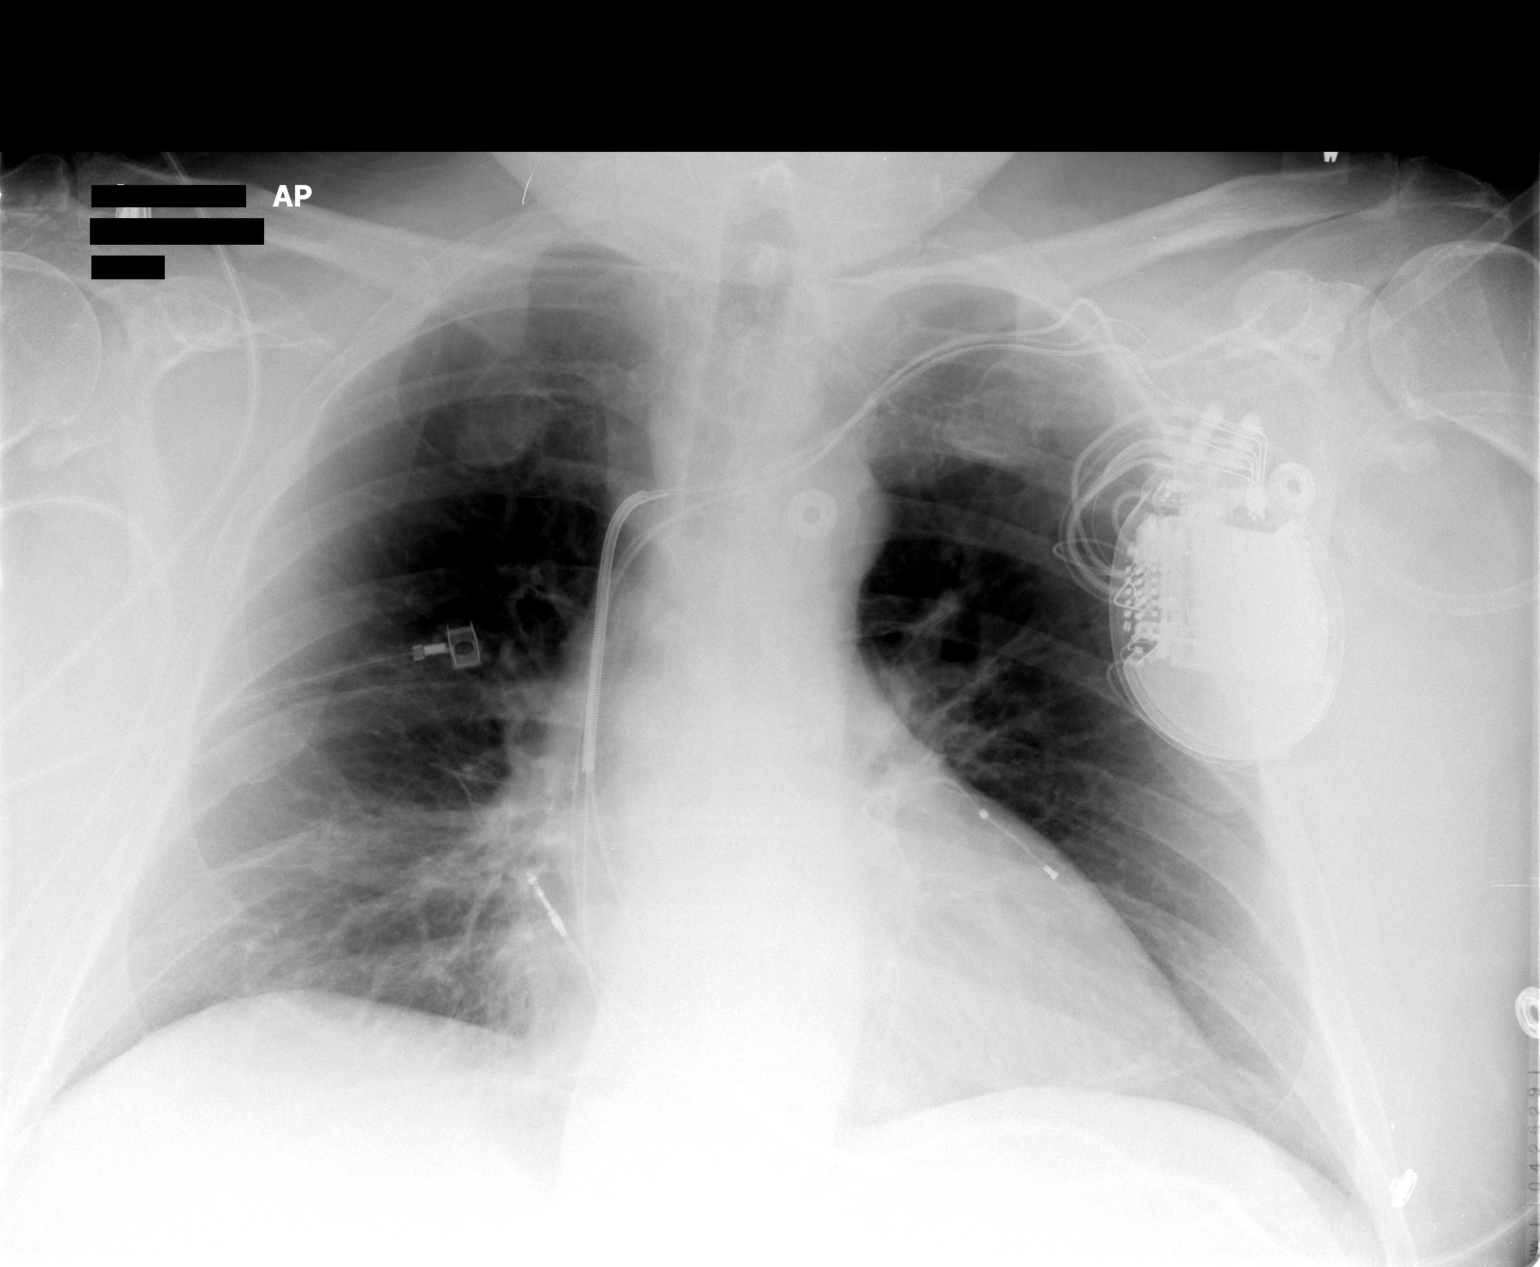

[1 of 1 positions shown; findings below may reference images not displayed]

FINDINGS: There is a left chest wall ICD.  The leads are in the
right atrial appendage, coronary sinus and right ventricle.

No complicating features are noted.  Specifically I see no evidence
for pneumothorax.

The heart size is normal.

No pleural effusion or pulmonary edema.

There is atelectasis at the right lung base.
IMPRESSION: 1.  No pneumothorax after placement of left-sided ICD.
2.  Right base atelectasis.

## 2010-02-20 IMAGING — CR DG CHEST 2V
2 series · 2 of 2 positions shown · non-contrast
Comparison: Portable chest 10/01/2008.

CLINICAL DATA: Pacemaker placement.

CHEST - 2 VIEW

[view not recorded (1 of 2)]
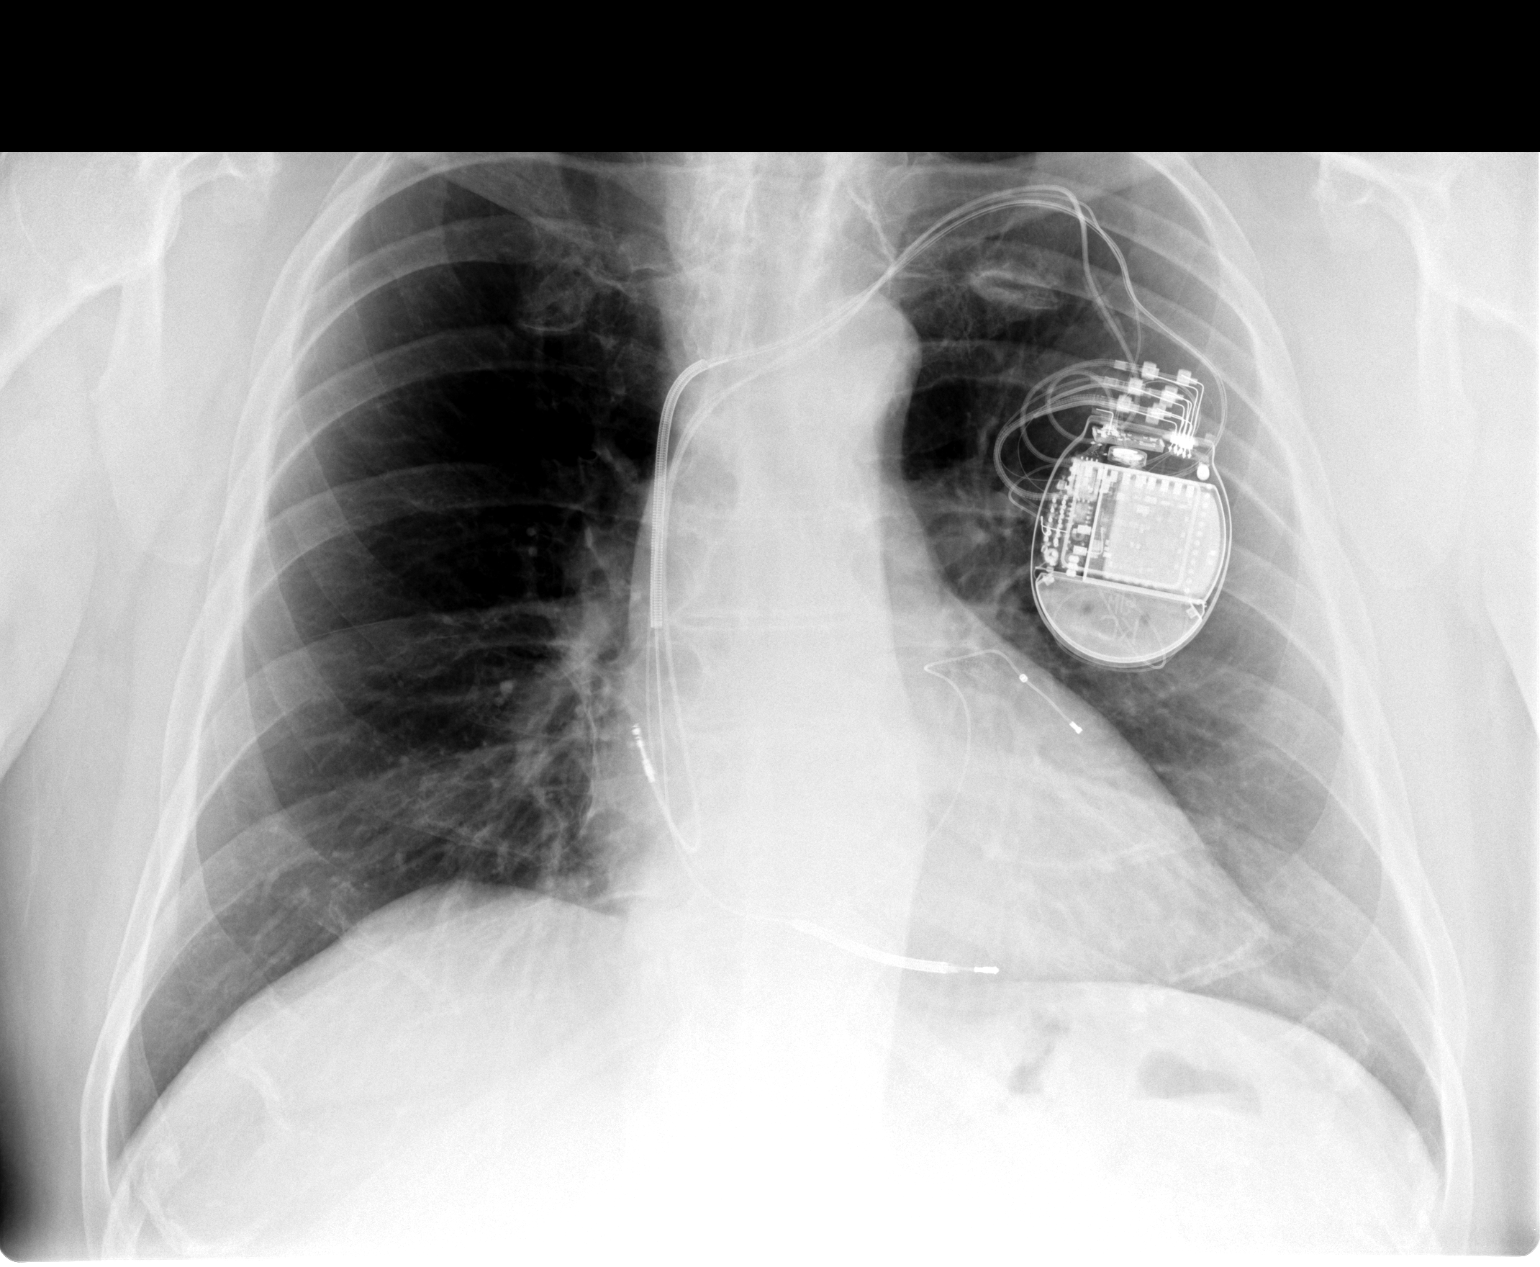

[view not recorded (2 of 2)]
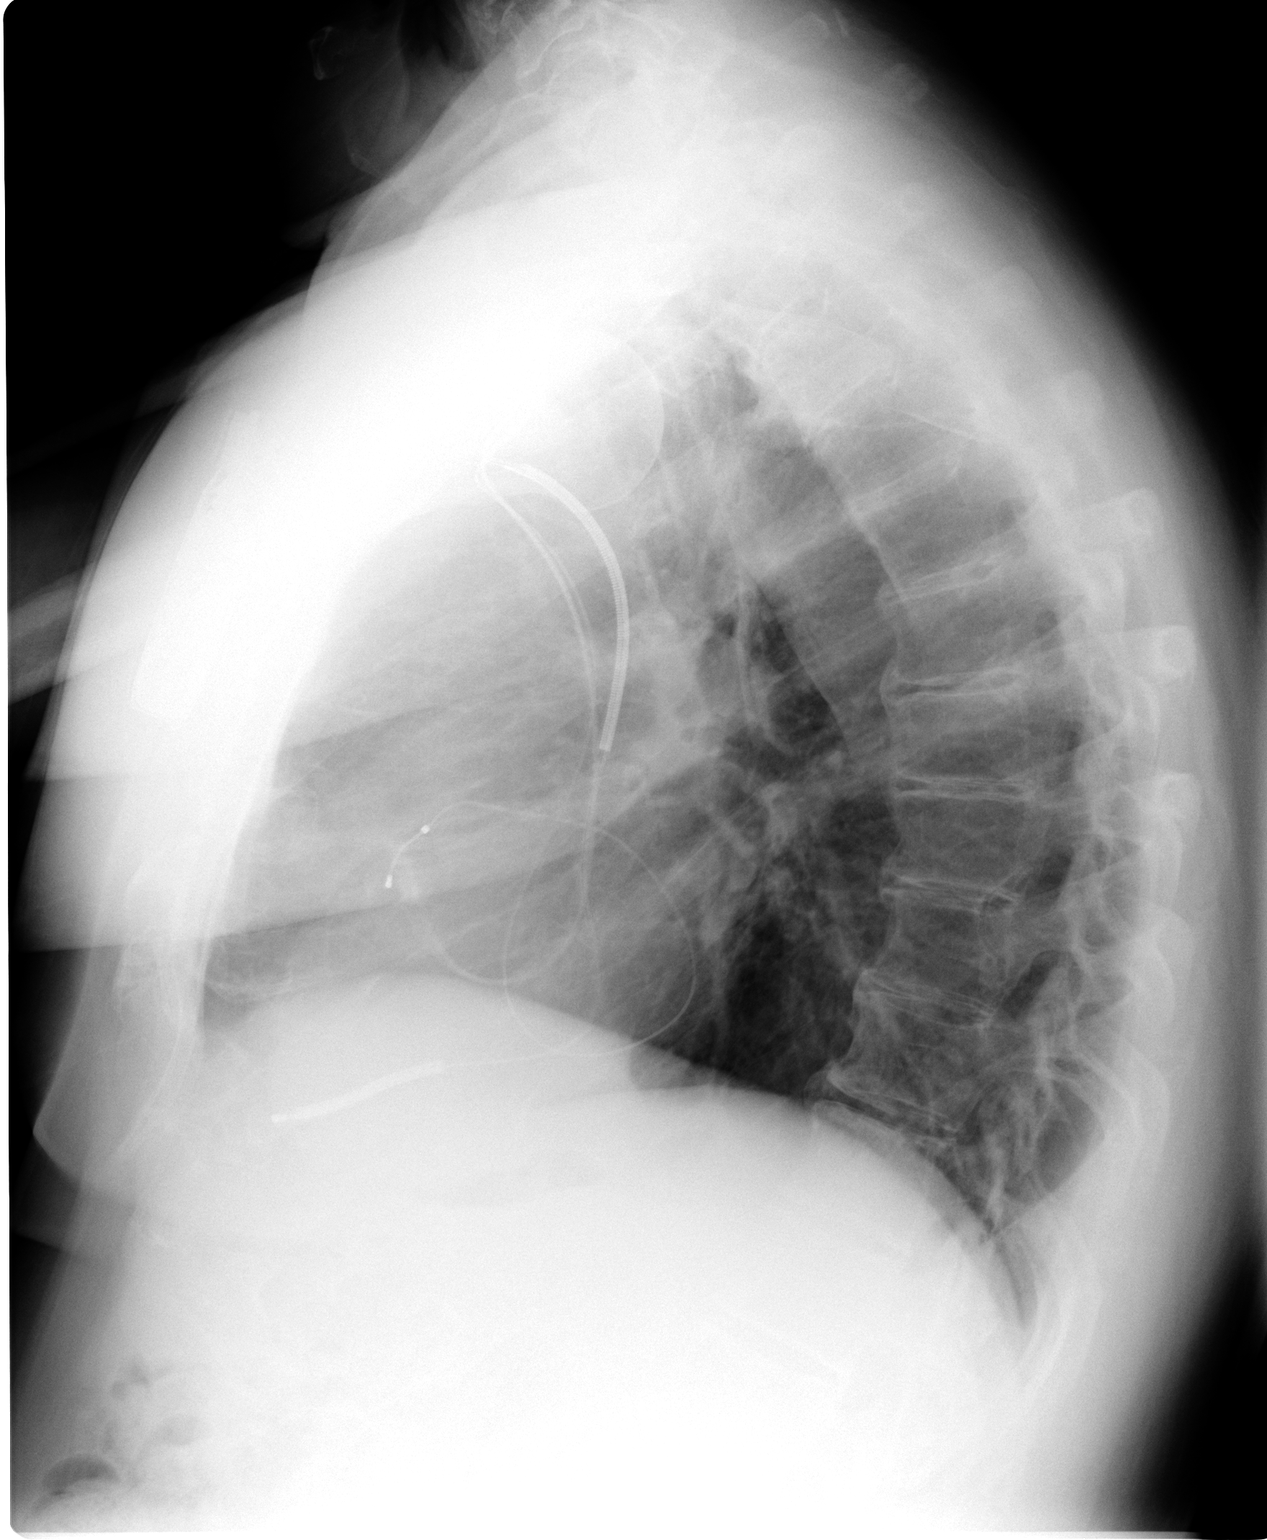

[2 of 2 positions shown; findings below may reference images not displayed]

FINDINGS: AICD/pacing device in place with tip in the proximal lead
in the right atrium, second lead in the apex the right ventricle
and a third lead exiting the coronary sinus.  There is no
pneumothorax.  Lungs are clear.  No pleural effusion.  Heart size
upper normal.
IMPRESSION: Pacer device in place without complicating features.  No acute
finding.

## 2010-03-02 IMAGING — CR DG CHEST 2V
2 series · 2 of 2 positions shown · non-contrast
Comparison: 10/11/2008 and earlier.

CLINICAL DATA: 52-year-old male with chest pain and recent
pacemaker placement 3 weeks ago.

CHEST - 2 VIEW

[w chest pa]
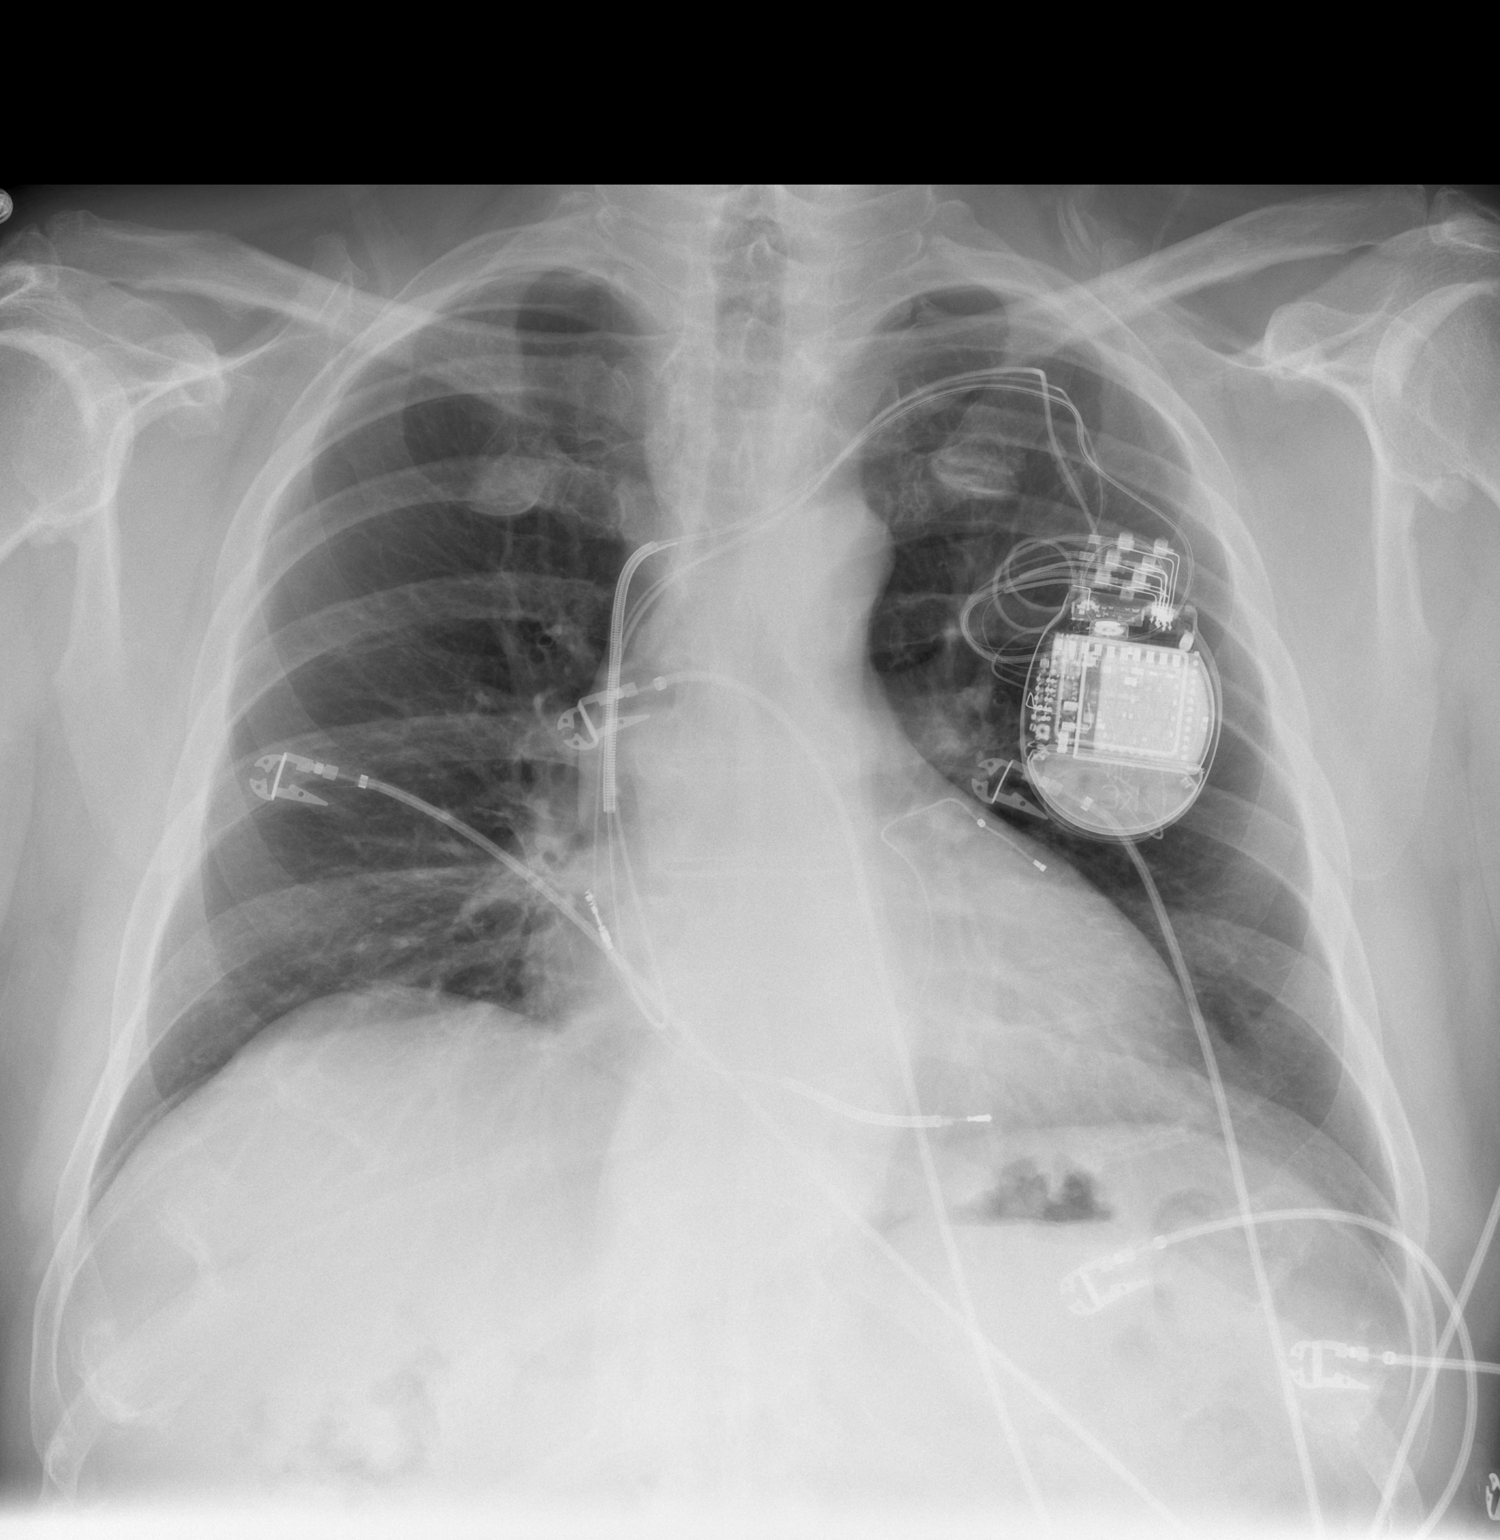

[w chest lat]
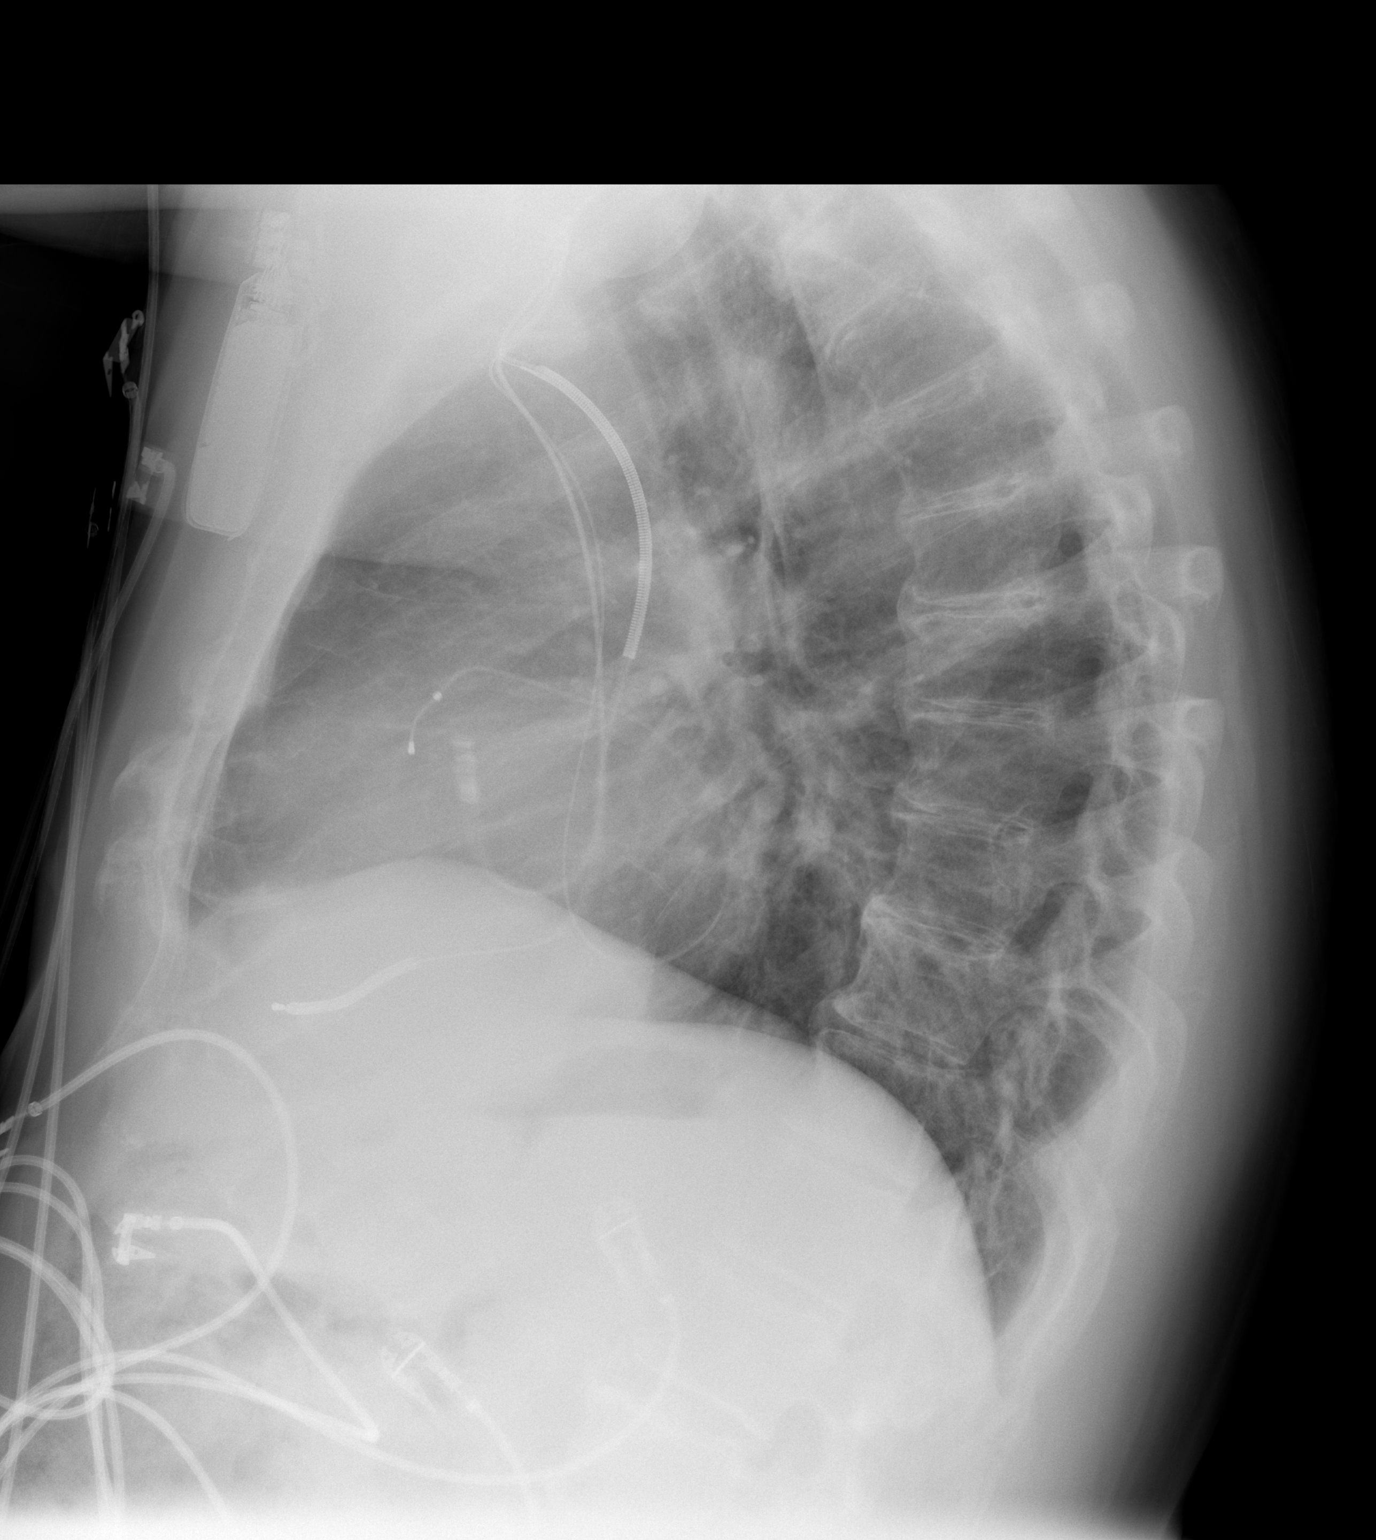

[2 of 2 positions shown; findings below may reference images not displayed]

FINDINGS: Stable left chest cardiac AICD with three transvenous
leads.  Stable cardiac size and mediastinal contours.  Mild
cardiomegaly.  Lung volumes are within normal limits.  No
pneumothorax, pulmonary edema, pleural effusion or acute airspace
opacity. Stable visualized osseous structures.  Tracheal air column
is within normal limits.
IMPRESSION: 1. No acute cardiopulmonary abnormality.
2.  Stable left cardiac AICD device and transvenous leads.

## 2010-03-23 ENCOUNTER — Emergency Department (HOSPITAL_COMMUNITY): Admission: EM | Admit: 2010-03-23 | Discharge: 2010-03-23 | Payer: Self-pay | Admitting: Emergency Medicine

## 2010-03-24 IMAGING — CR DG CHEST 1V PORT
1 series · 1 of 1 positions shown · non-contrast
Comparison: 10/21/2008

CLINICAL DATA: Chest pain.

PORTABLE CHEST - 1 VIEW

[view not recorded]
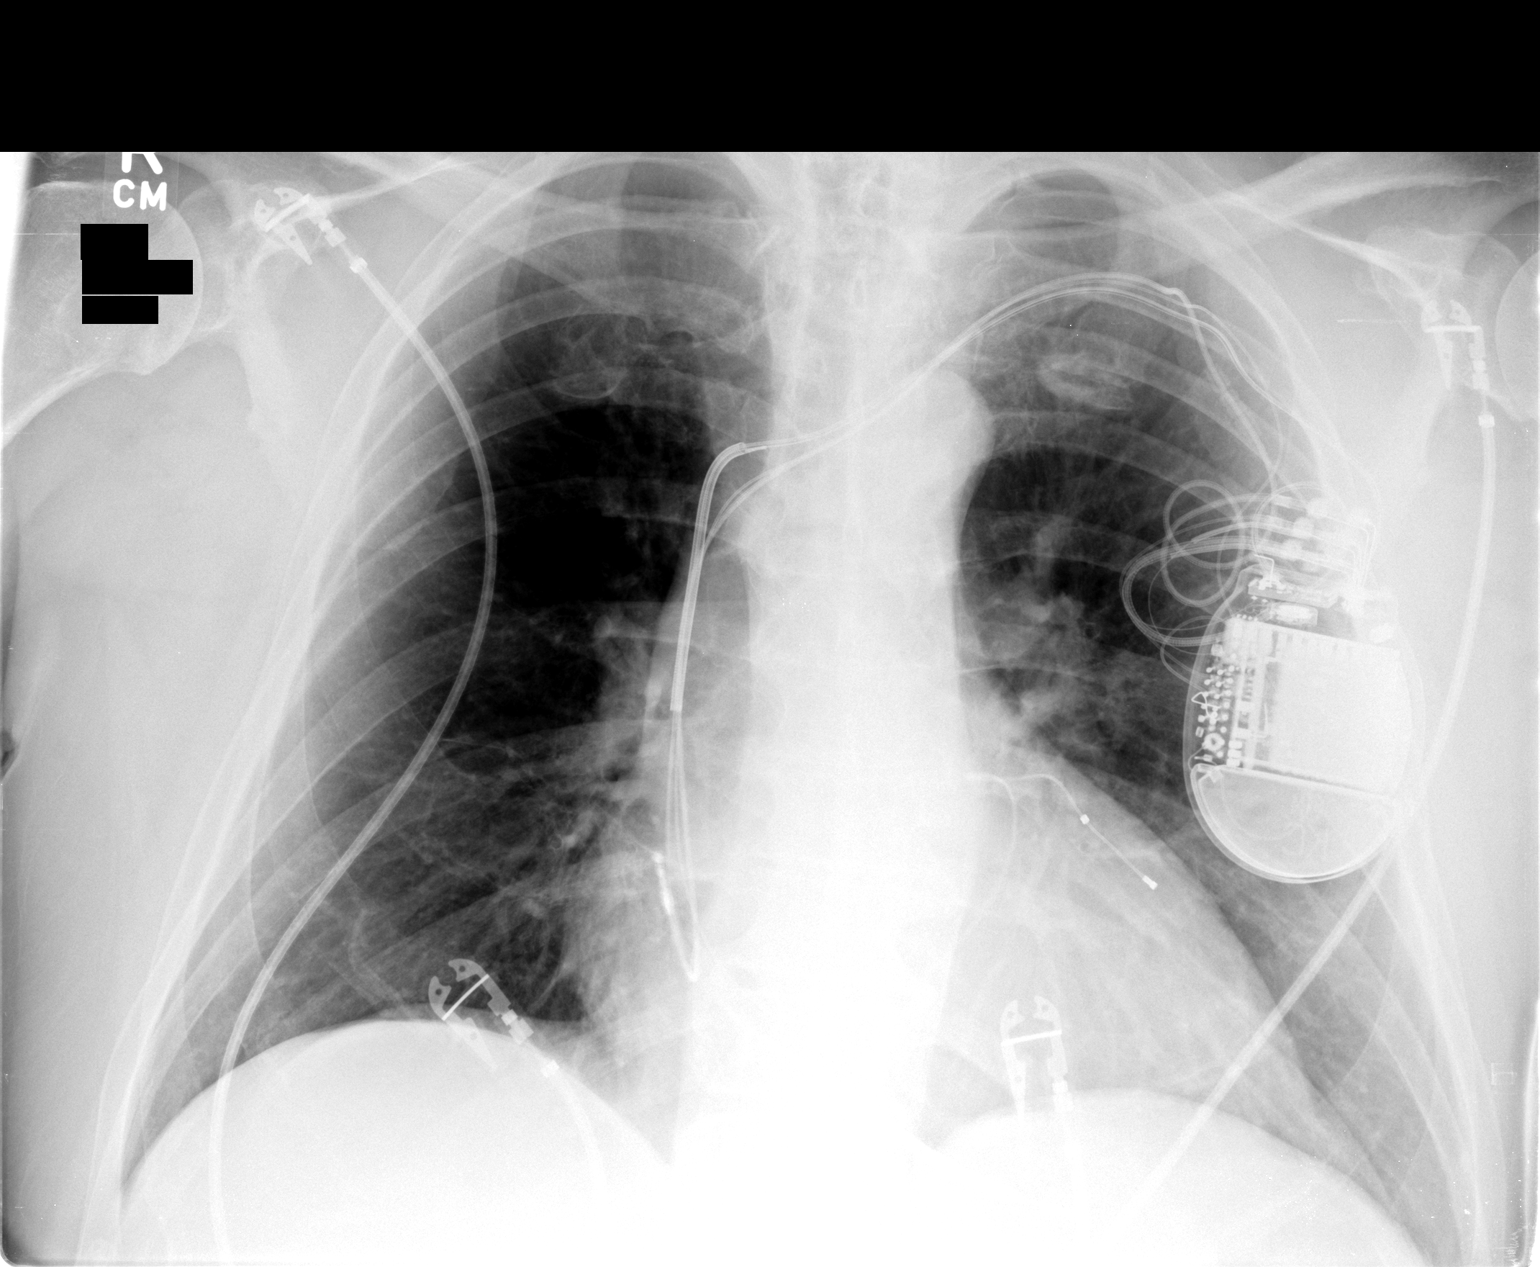

[1 of 1 positions shown; findings below may reference images not displayed]

FINDINGS: Three lead left subclavian AICD with lead unchanged in
the right ventricular apex, right atrial appendage and coronary
sinus.  No airspace disease or edema.  No evidence of failure.
Costophrenic angle excluded from view on the left.
Cardiopericardial silhouette is upper limits of normal for
projection.  Pacemaker power pack overlies and obscures a portion
of the left chest.
IMPRESSION: 1.  Unchanged appearance of AICD.
2.  No acute cardiopulmonary disease.  No evidence of failure.

## 2010-04-10 IMAGING — CR DG CHEST 1V PORT
1 series · 1 of 1 positions shown · non-contrast
Comparison: 11/12/2008

CLINICAL DATA: Chest pain.

PORTABLE CHEST - 1 VIEW

[view not recorded]
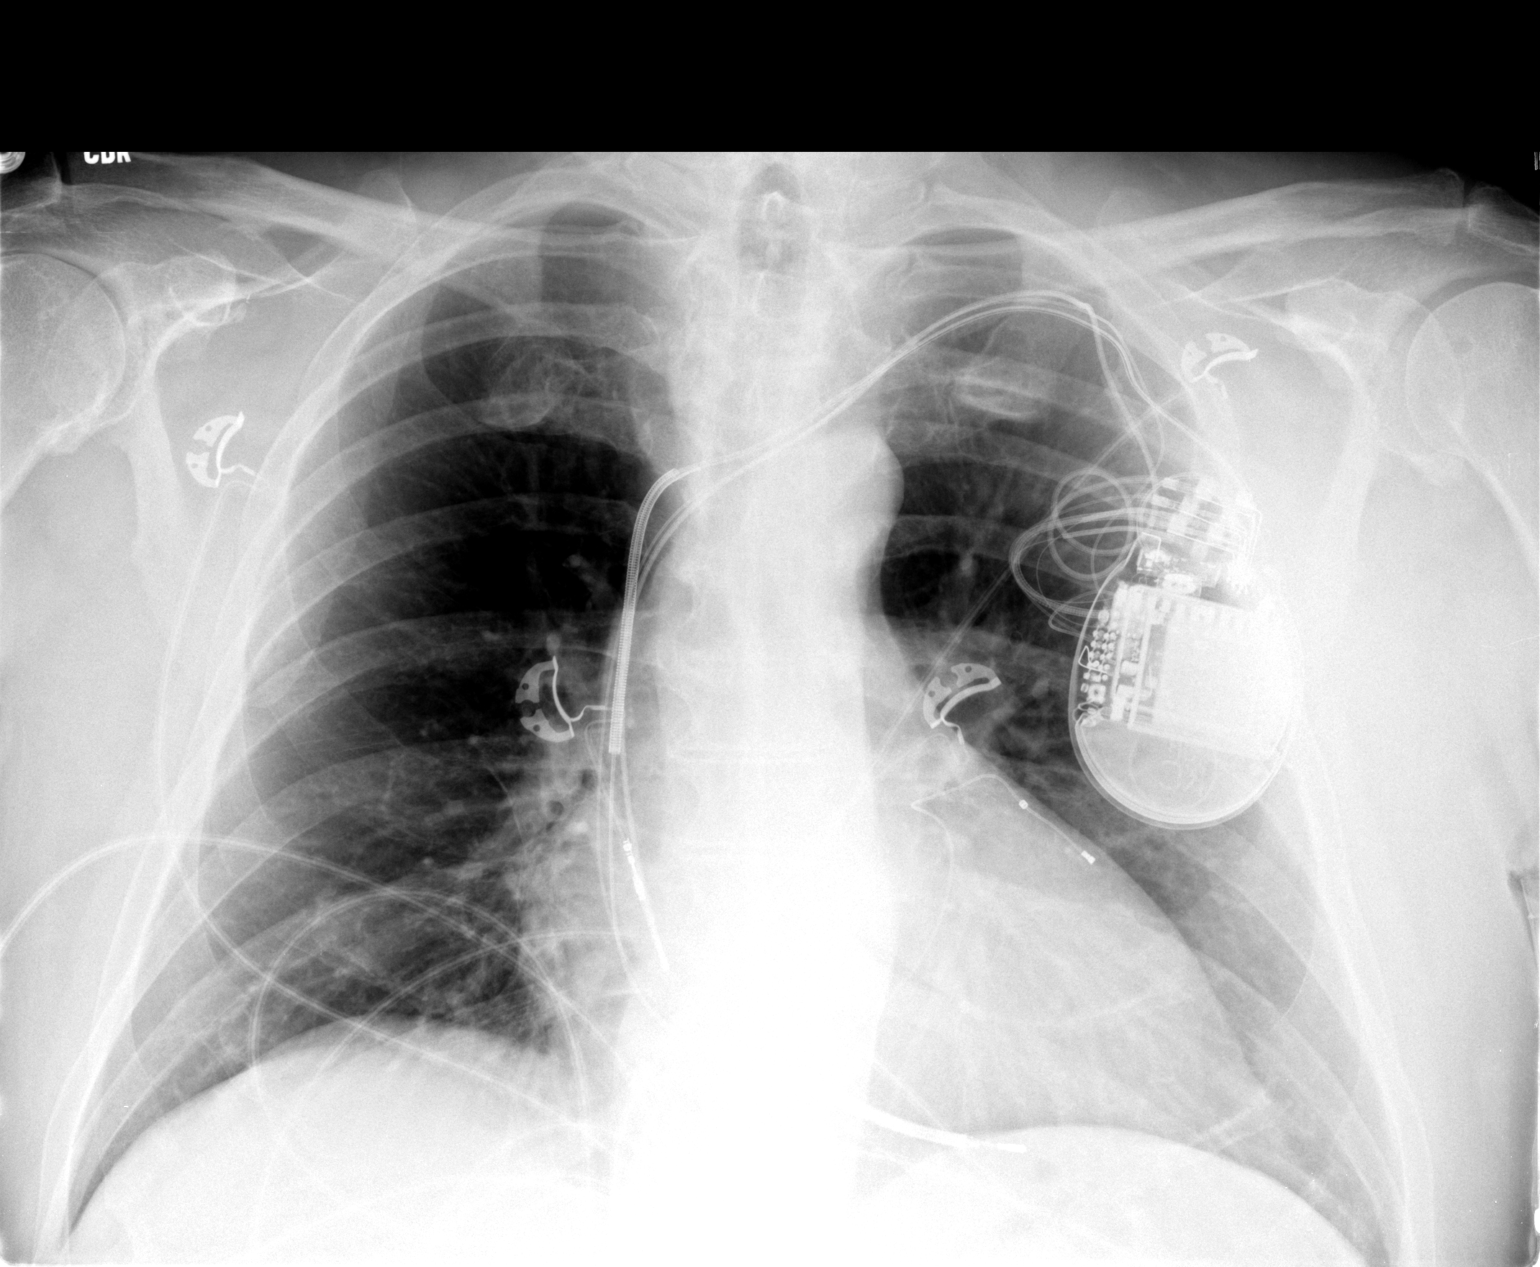

[1 of 1 positions shown; findings below may reference images not displayed]

FINDINGS: AICD / pacemaker remain in place.  Cardiomegaly persists.
Lungs are clear.  Vascularity is normal.  No effusions.
IMPRESSION: No change.  No active process evident.

## 2010-04-23 ENCOUNTER — Encounter: Payer: Self-pay | Admitting: Internal Medicine

## 2010-05-08 ENCOUNTER — Encounter: Payer: Self-pay | Admitting: Internal Medicine

## 2010-05-20 IMAGING — CR DG CHEST 2V
2 series · 2 of 2 positions shown · non-contrast
Comparison: 11/29/2008

CLINICAL DATA: Chest pain.  Diaphoresis.  Dizziness.  Previous
cardiovascular disease.

CHEST - 2 VIEW

[w chest pa]
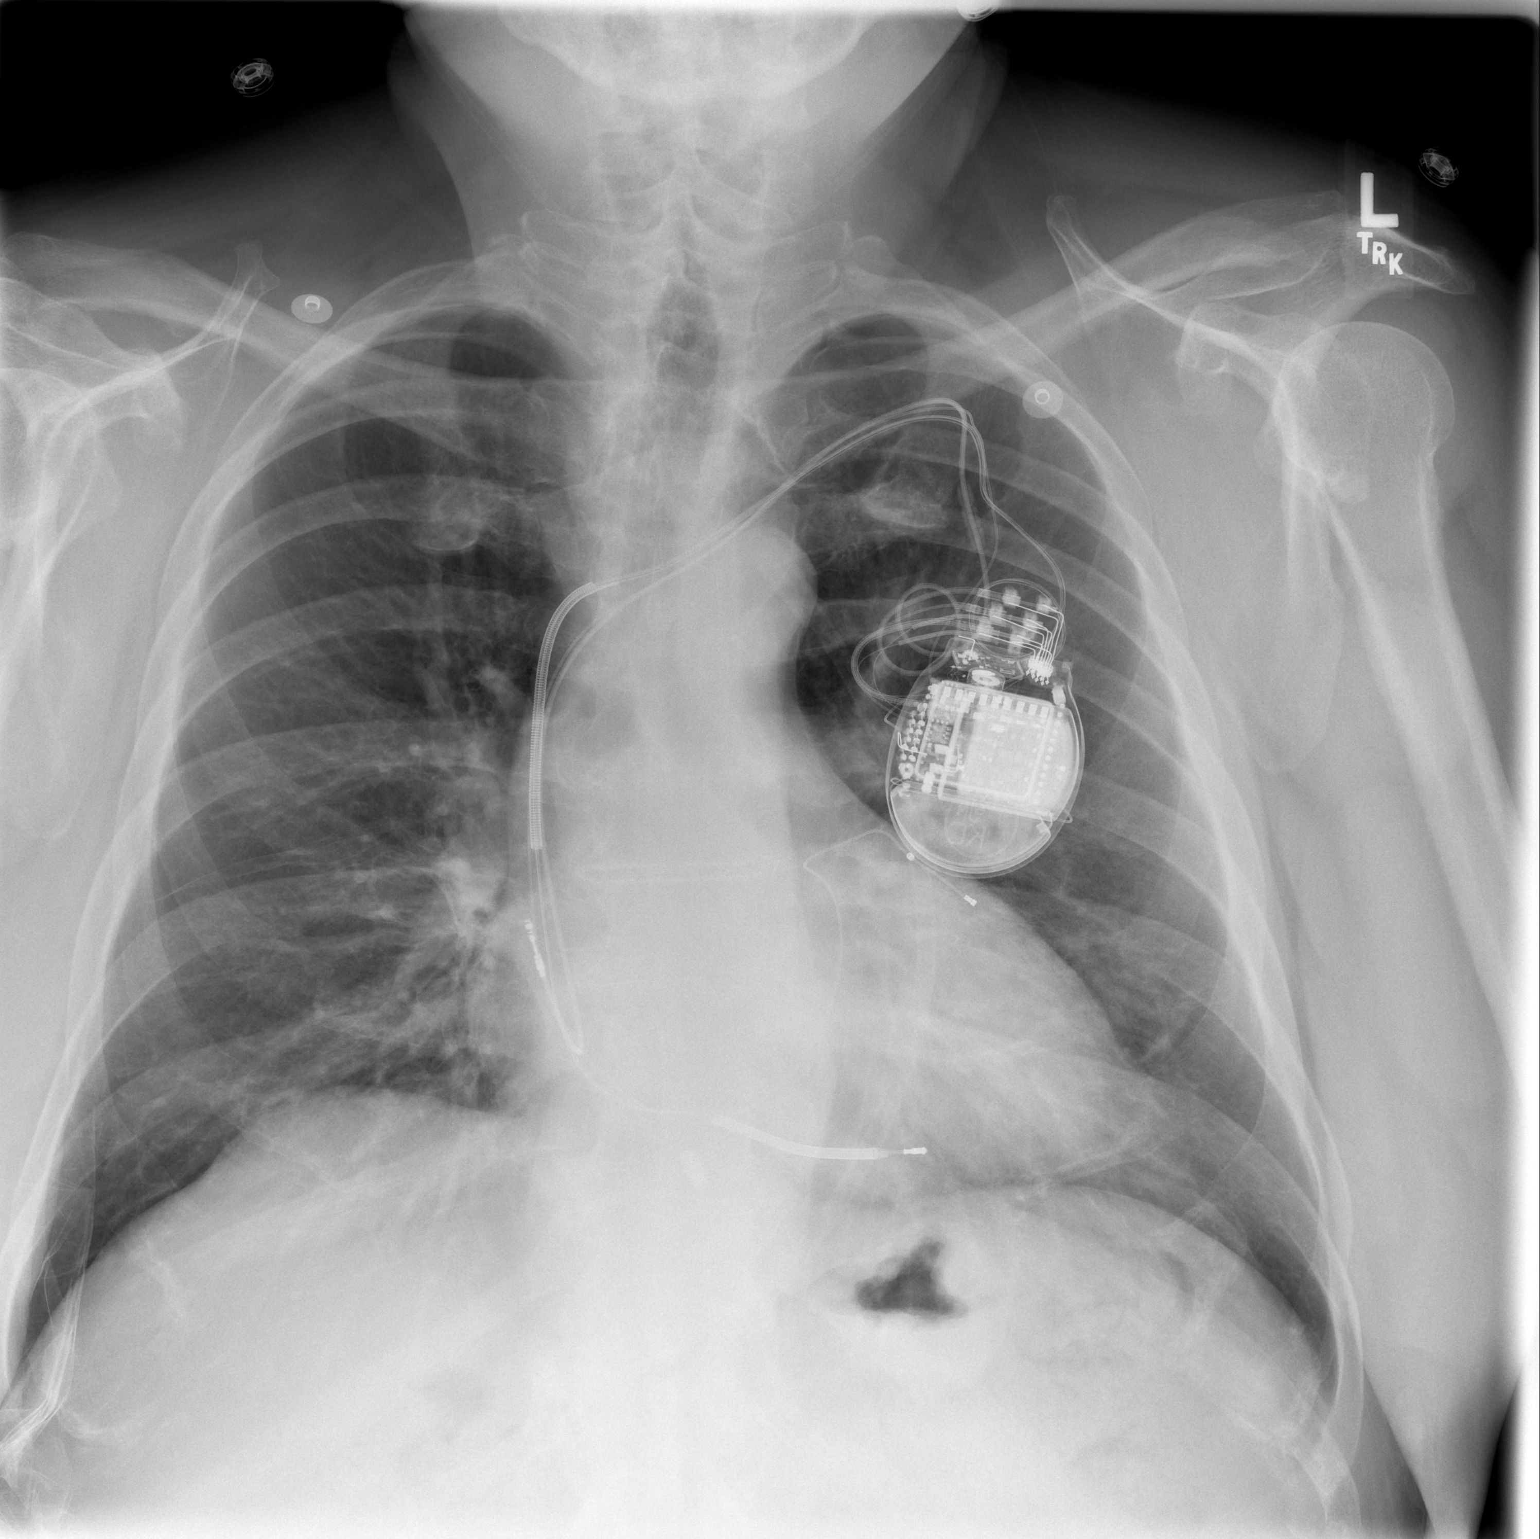

[w chest lat]
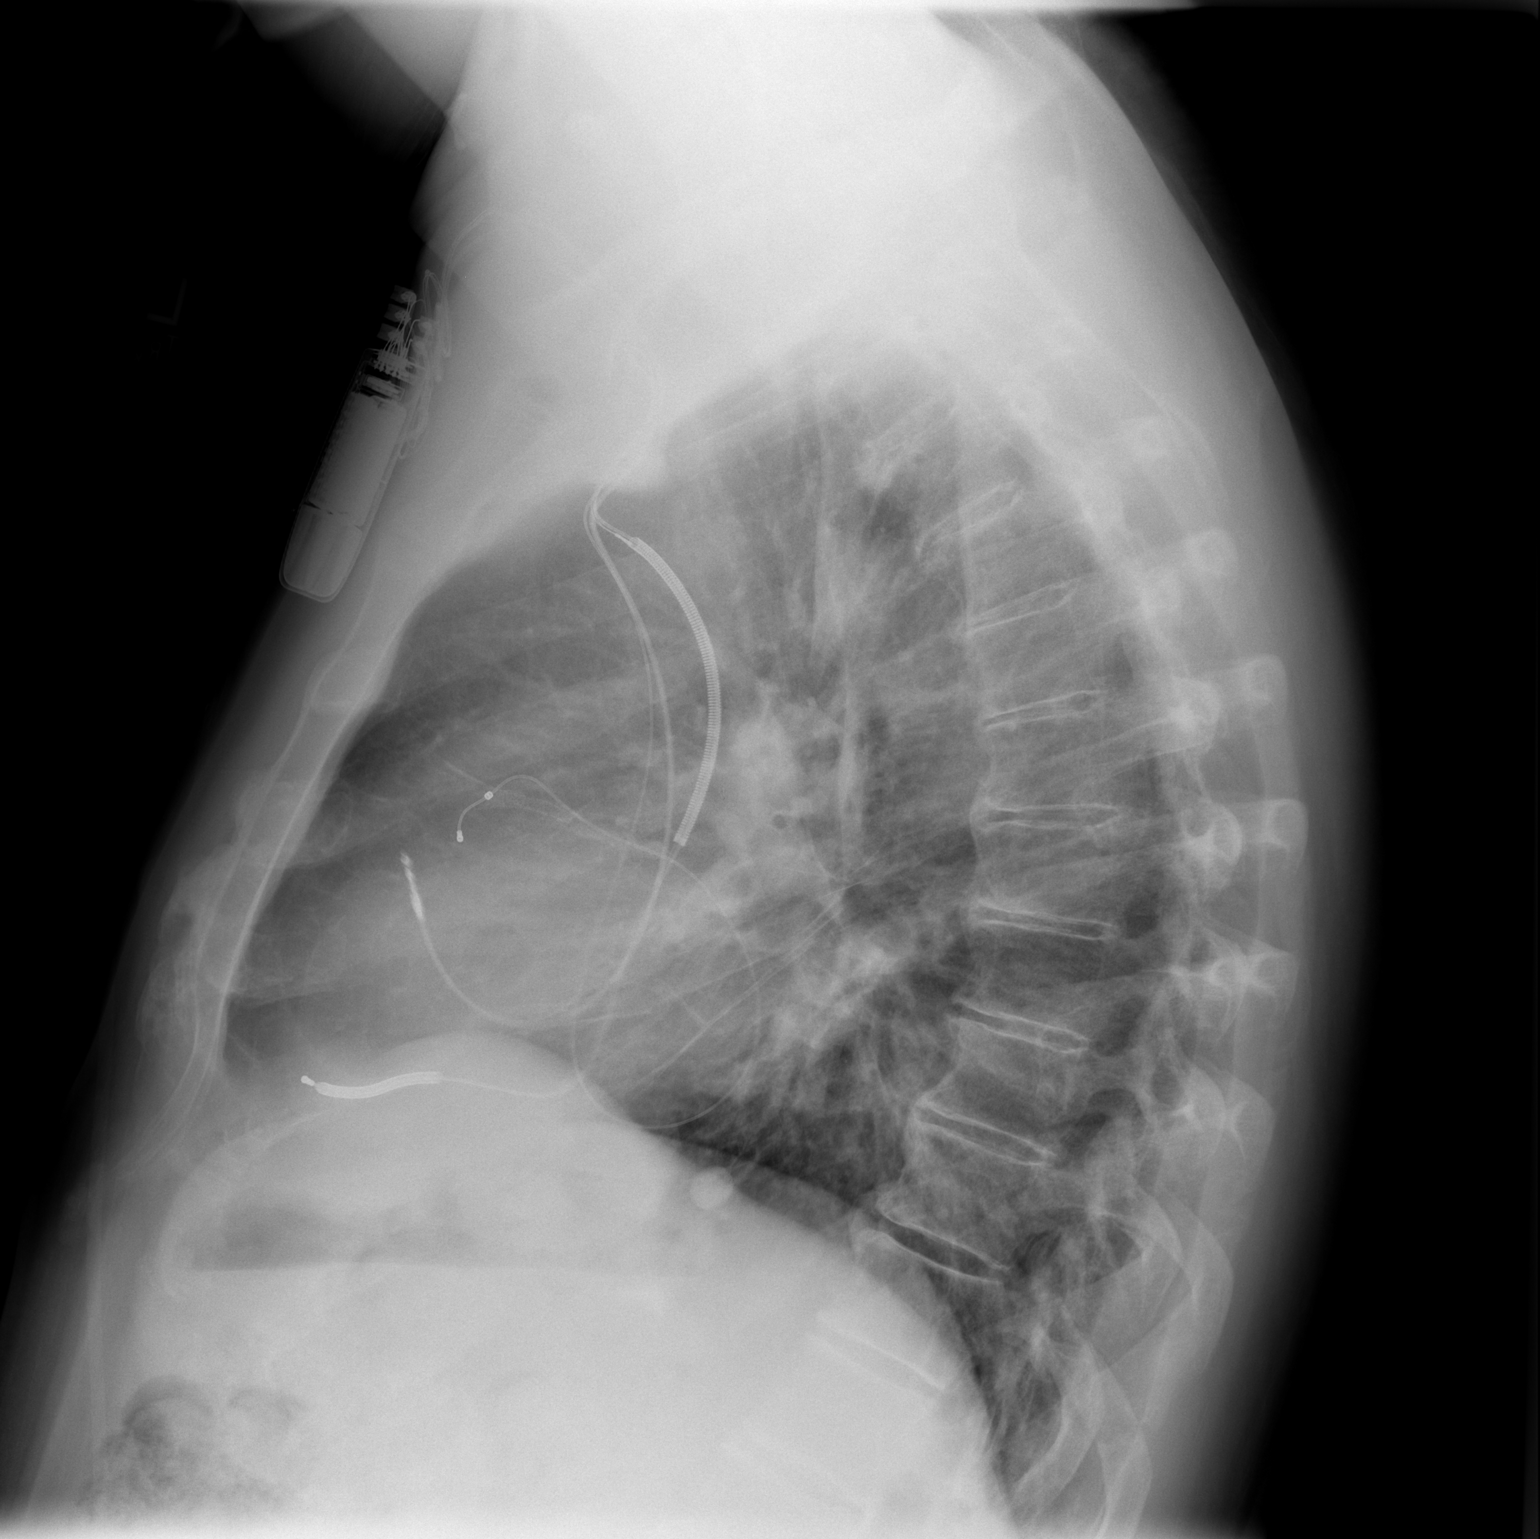

[2 of 2 positions shown; findings below may reference images not displayed]

FINDINGS: Pacemaker/AICD remains in place.  Heart size remains at
the upper limits of normal.  There may be venous hypertension but
there is no interstitial or alveolar edema.  No effusions.  No
focal pulmonary abnormalities.
IMPRESSION: Possible venous hypertension without frank edema.

## 2010-05-22 ENCOUNTER — Ambulatory Visit: Payer: Self-pay | Admitting: Oncology

## 2010-05-26 ENCOUNTER — Ambulatory Visit (HOSPITAL_COMMUNITY): Admission: RE | Admit: 2010-05-26 | Discharge: 2010-05-26 | Payer: Self-pay | Admitting: Oncology

## 2010-05-26 LAB — CBC WITH DIFFERENTIAL/PLATELET
Basophils Absolute: 0 10*3/uL (ref 0.0–0.1)
Eosinophils Absolute: 0.3 10*3/uL (ref 0.0–0.5)
HCT: 40.2 % (ref 38.4–49.9)
HGB: 13.8 g/dL (ref 13.0–17.1)
LYMPH%: 11.6 % — ABNORMAL LOW (ref 14.0–49.0)
MONO#: 0.6 10*3/uL (ref 0.1–0.9)
NEUT#: 4.9 10*3/uL (ref 1.5–6.5)
NEUT%: 74.3 % (ref 39.0–75.0)
Platelets: 123 10*3/uL — ABNORMAL LOW (ref 140–400)
RBC: 4.1 10*6/uL — ABNORMAL LOW (ref 4.20–5.82)
WBC: 6.6 10*3/uL (ref 4.0–10.3)

## 2010-05-26 LAB — COMPREHENSIVE METABOLIC PANEL
Albumin: 3.4 g/dL — ABNORMAL LOW (ref 3.5–5.2)
BUN: 14 mg/dL (ref 6–23)
CO2: 26 mEq/L (ref 19–32)
Glucose, Bld: 154 mg/dL — ABNORMAL HIGH (ref 70–99)
Sodium: 138 mEq/L (ref 135–145)
Total Bilirubin: 1.2 mg/dL (ref 0.3–1.2)
Total Protein: 7.2 g/dL (ref 6.0–8.3)

## 2010-05-26 LAB — CEA: CEA: <0.5 ng/mL (ref 0.0–5.0)

## 2010-06-18 IMAGING — CR DG CHEST 2V
2 series · 2 of 2 positions shown · non-contrast
Comparison: PA and lateral chest 01/08/2009.

CLINICAL DATA: Shortness of breath and wheezing.

CHEST - 2 VIEW

[w chest pa]
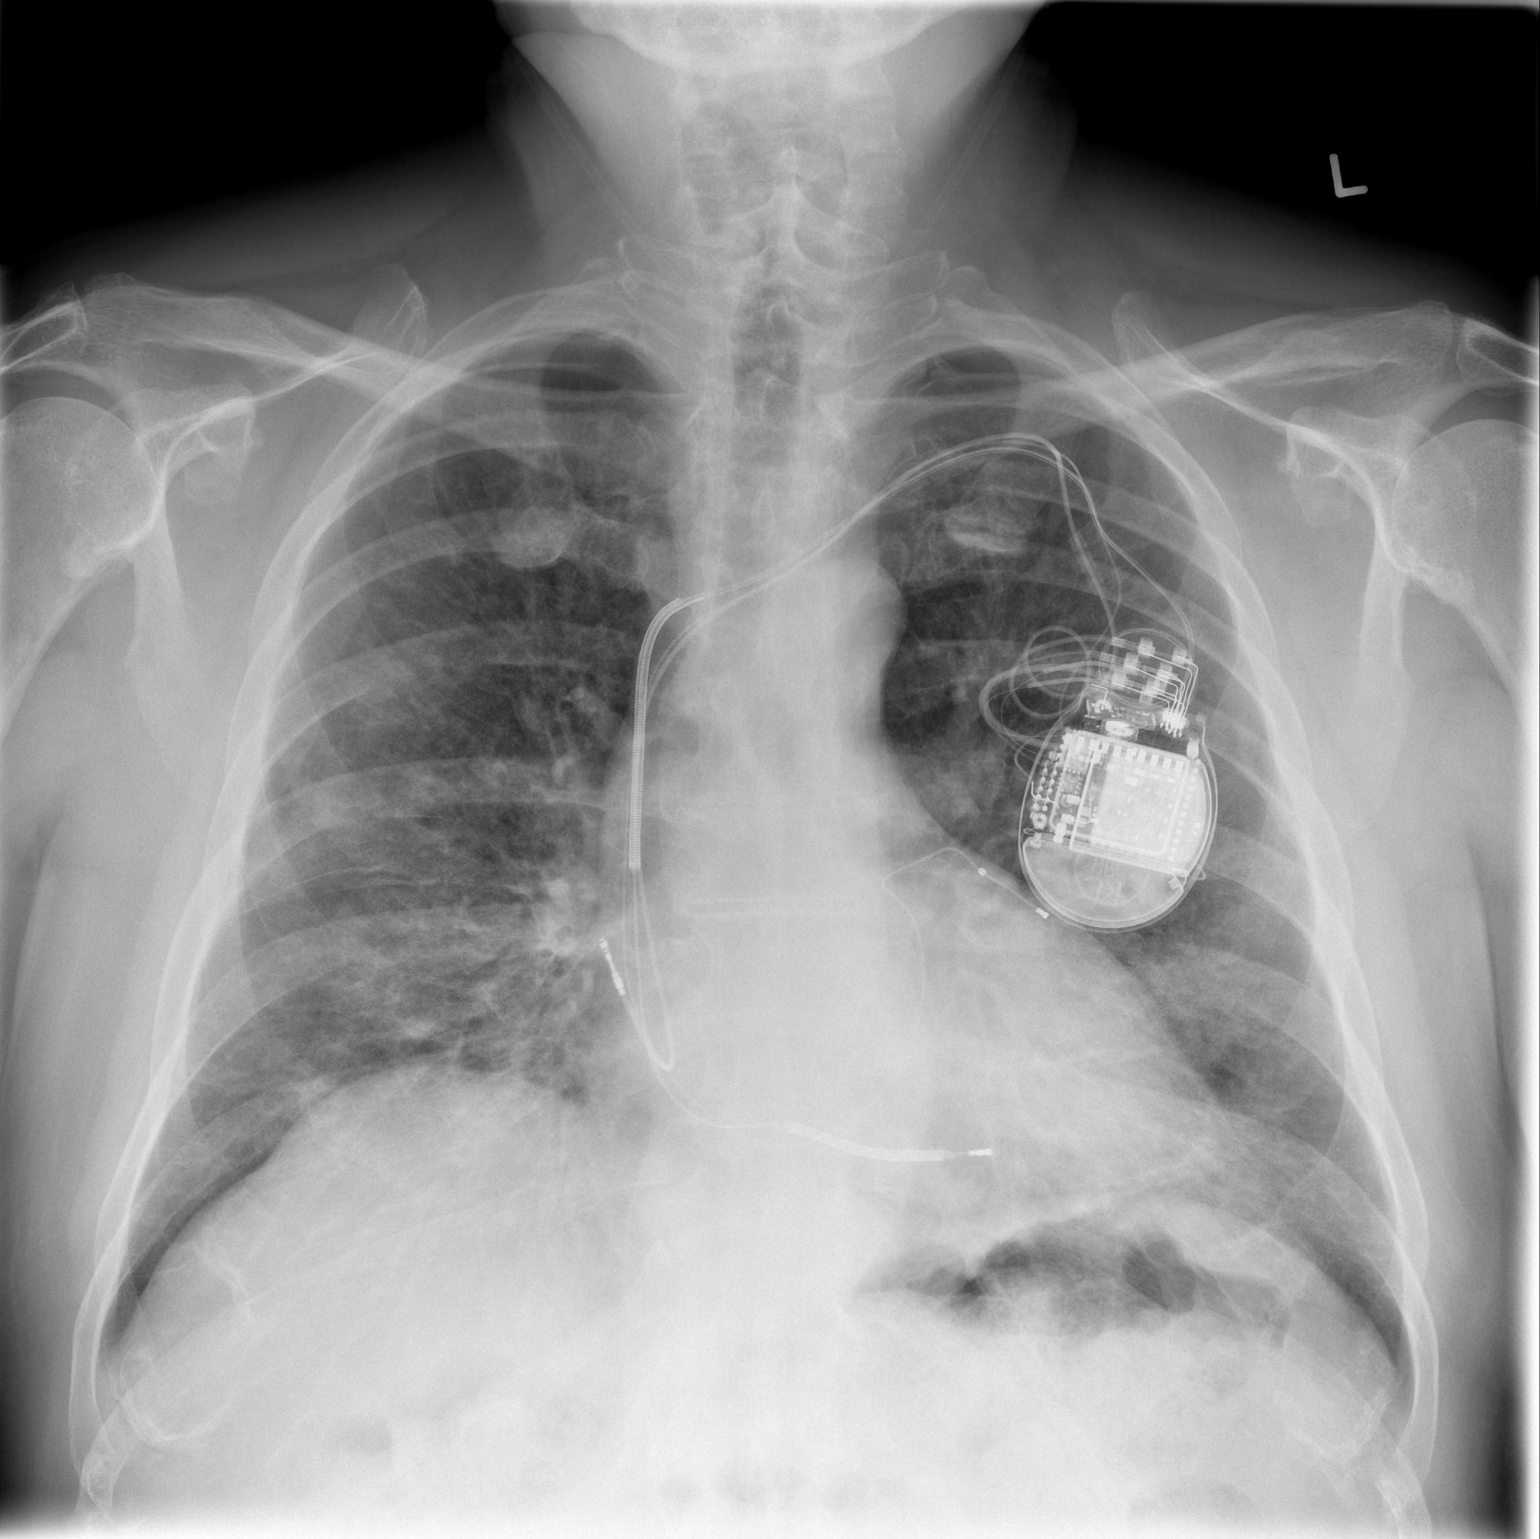

[w chest lat]
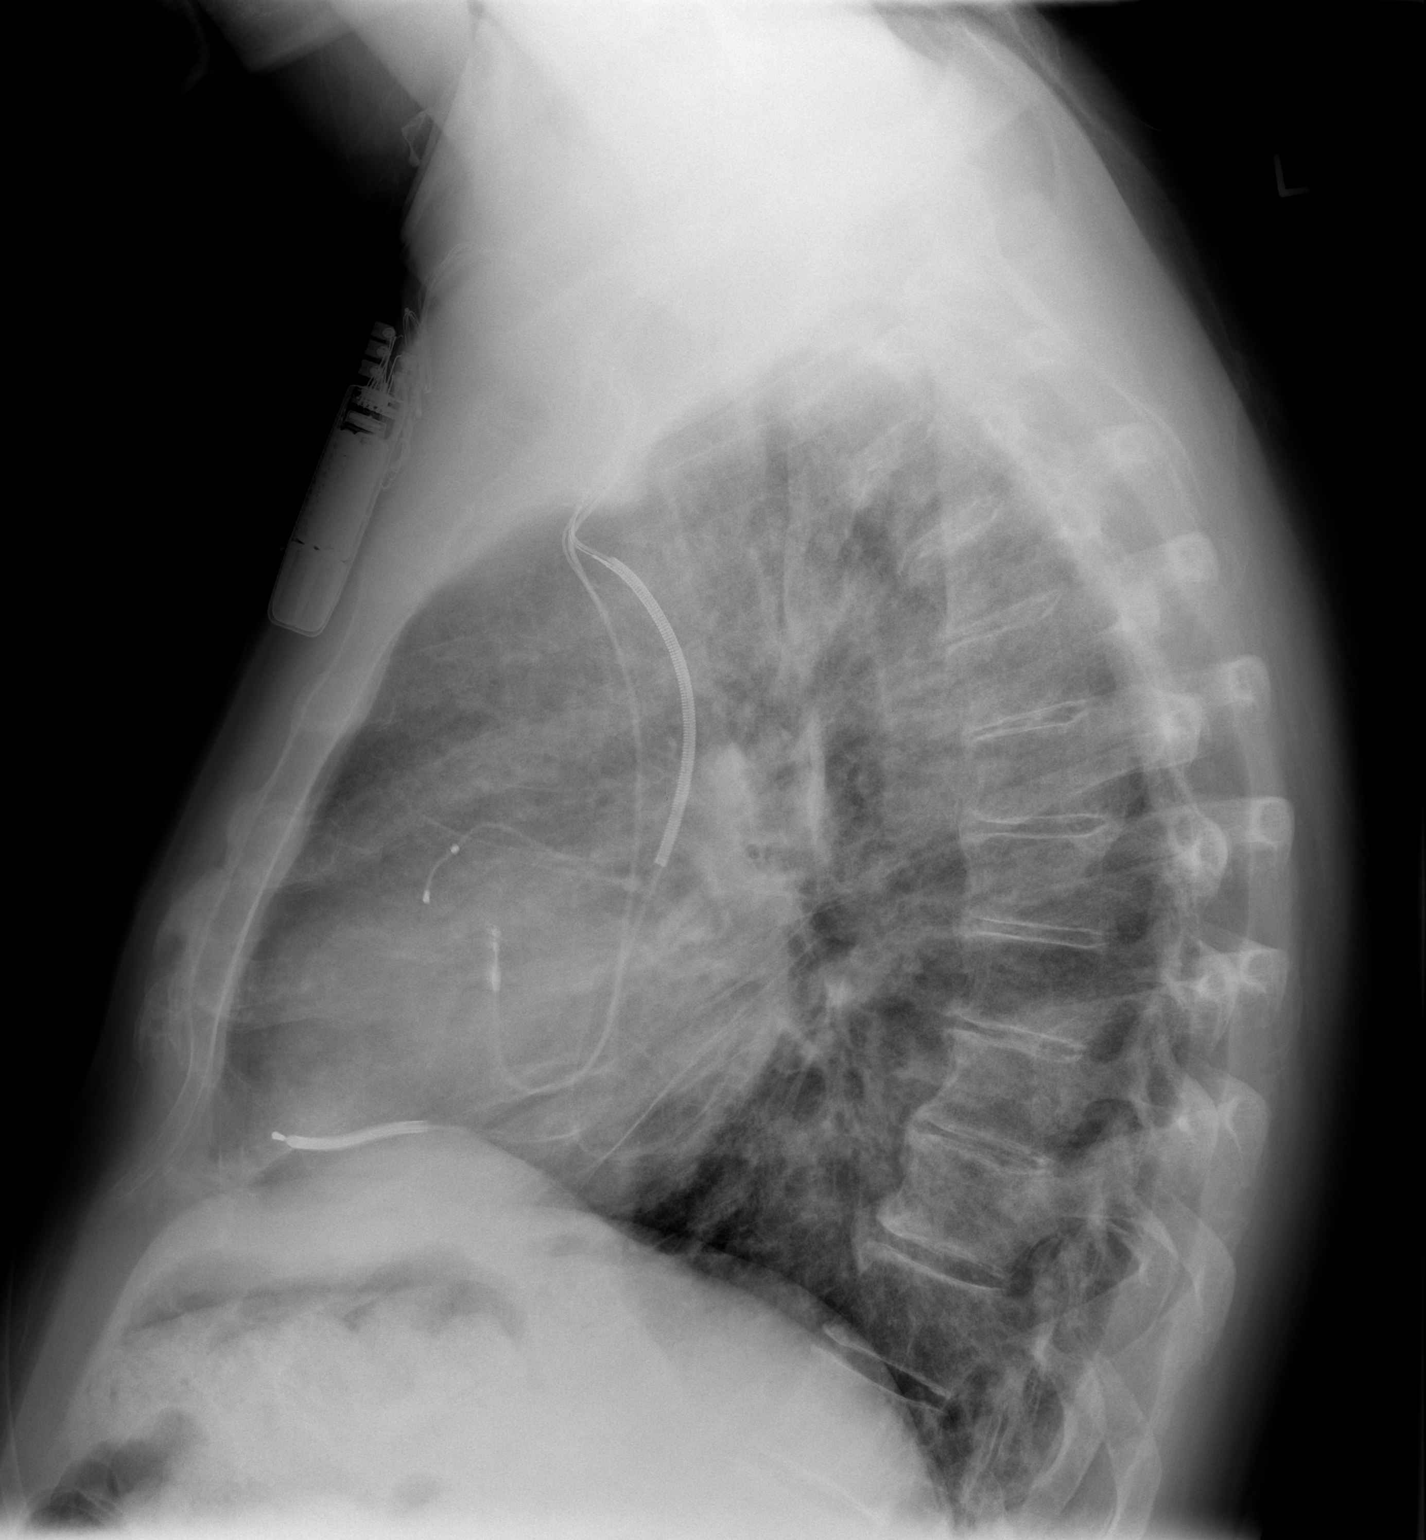

[2 of 2 positions shown; findings below may reference images not displayed]

FINDINGS: Pacing device again noted.  There is new fullness of the
pulmonary interstitium compatible with mild interstitial edema.  No
effusion or focal process. Heart size upper normal.
IMPRESSION: Mild interstitial pulmonary edema.

## 2010-06-19 IMAGING — CR DG CHEST 2V
2 series · 2 of 2 positions shown · non-contrast
Comparison: 02/06/2009 and earlier.

CLINICAL DATA: 52-year-old male with congestive heart failure.

CHEST - 2 VIEW

[w chest pa]
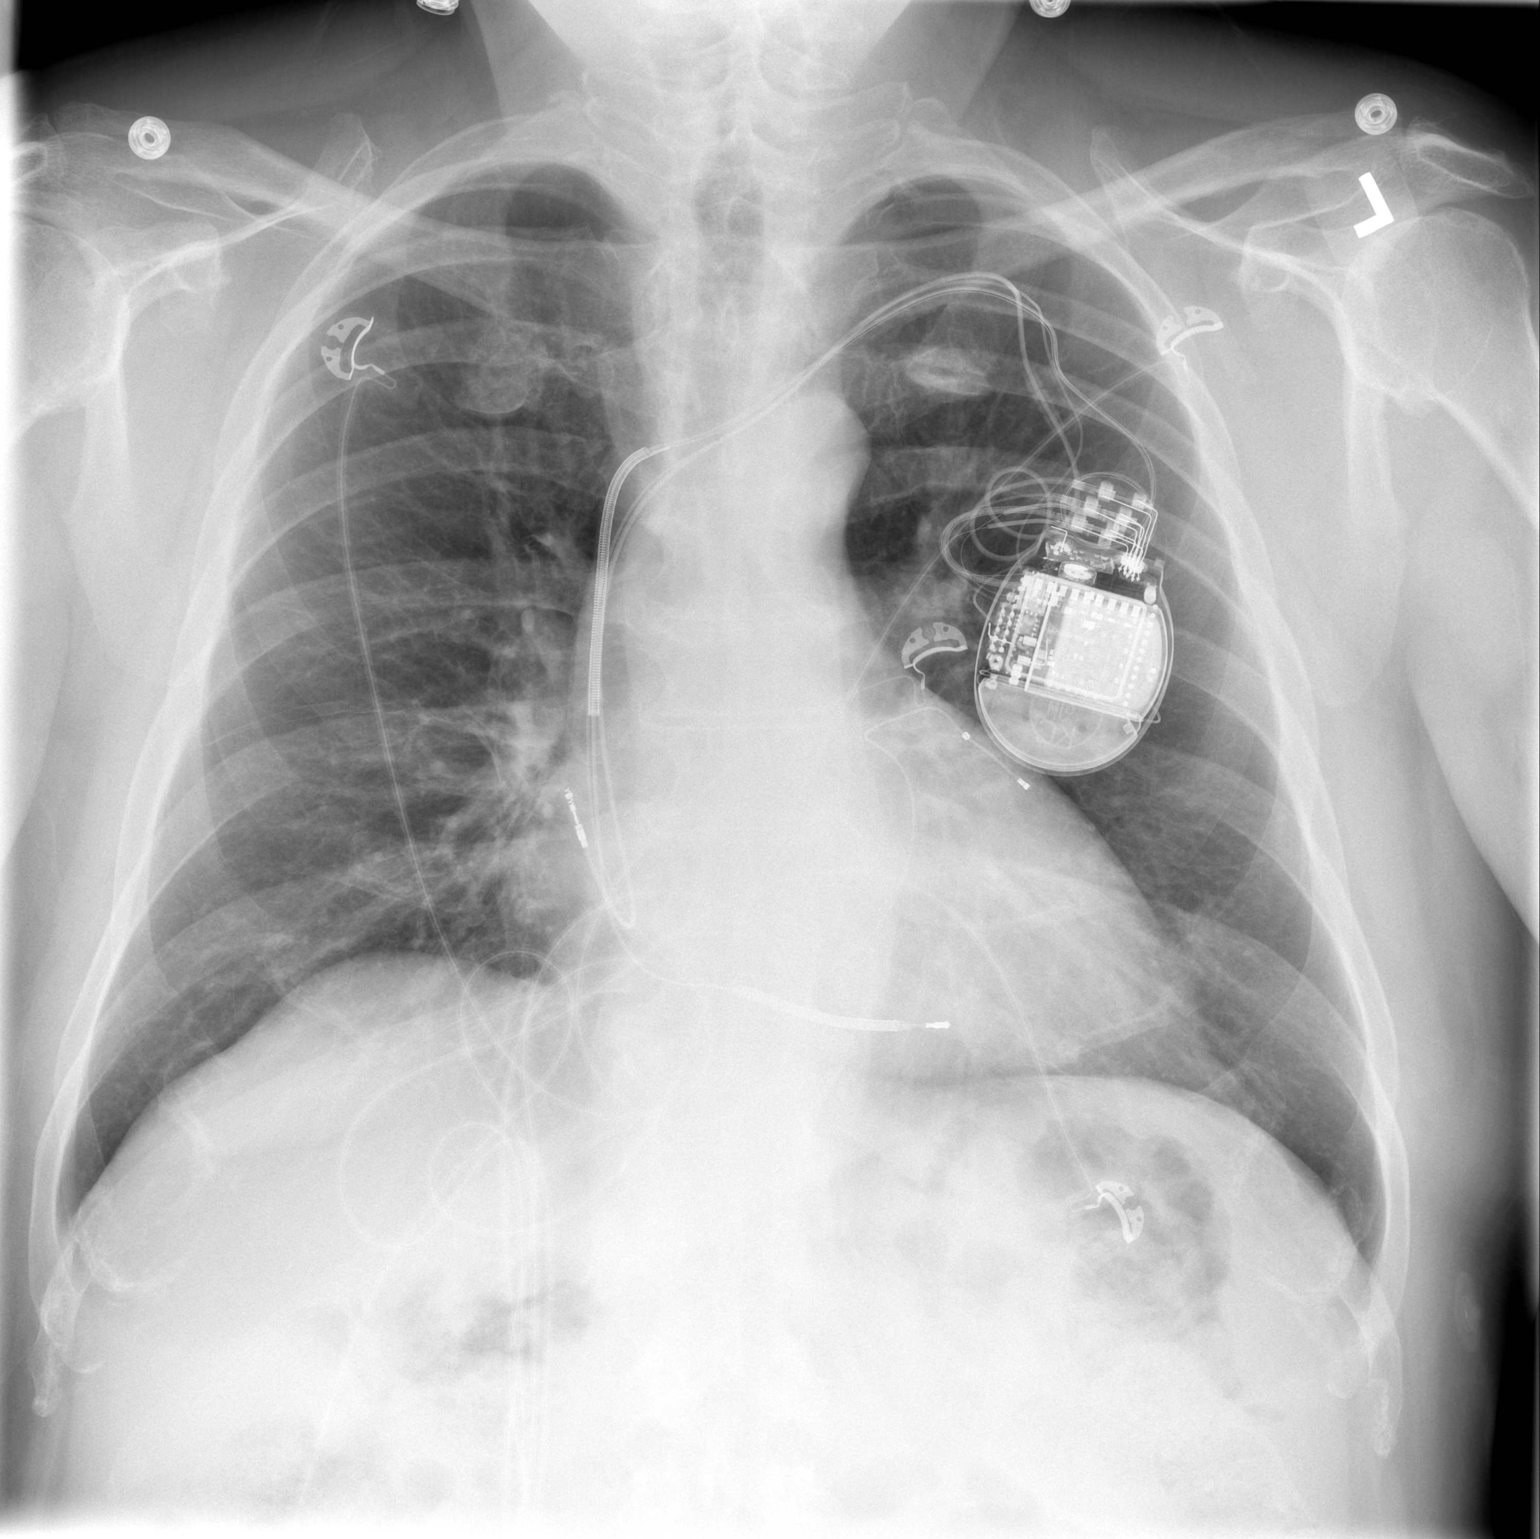

[w chest lat]
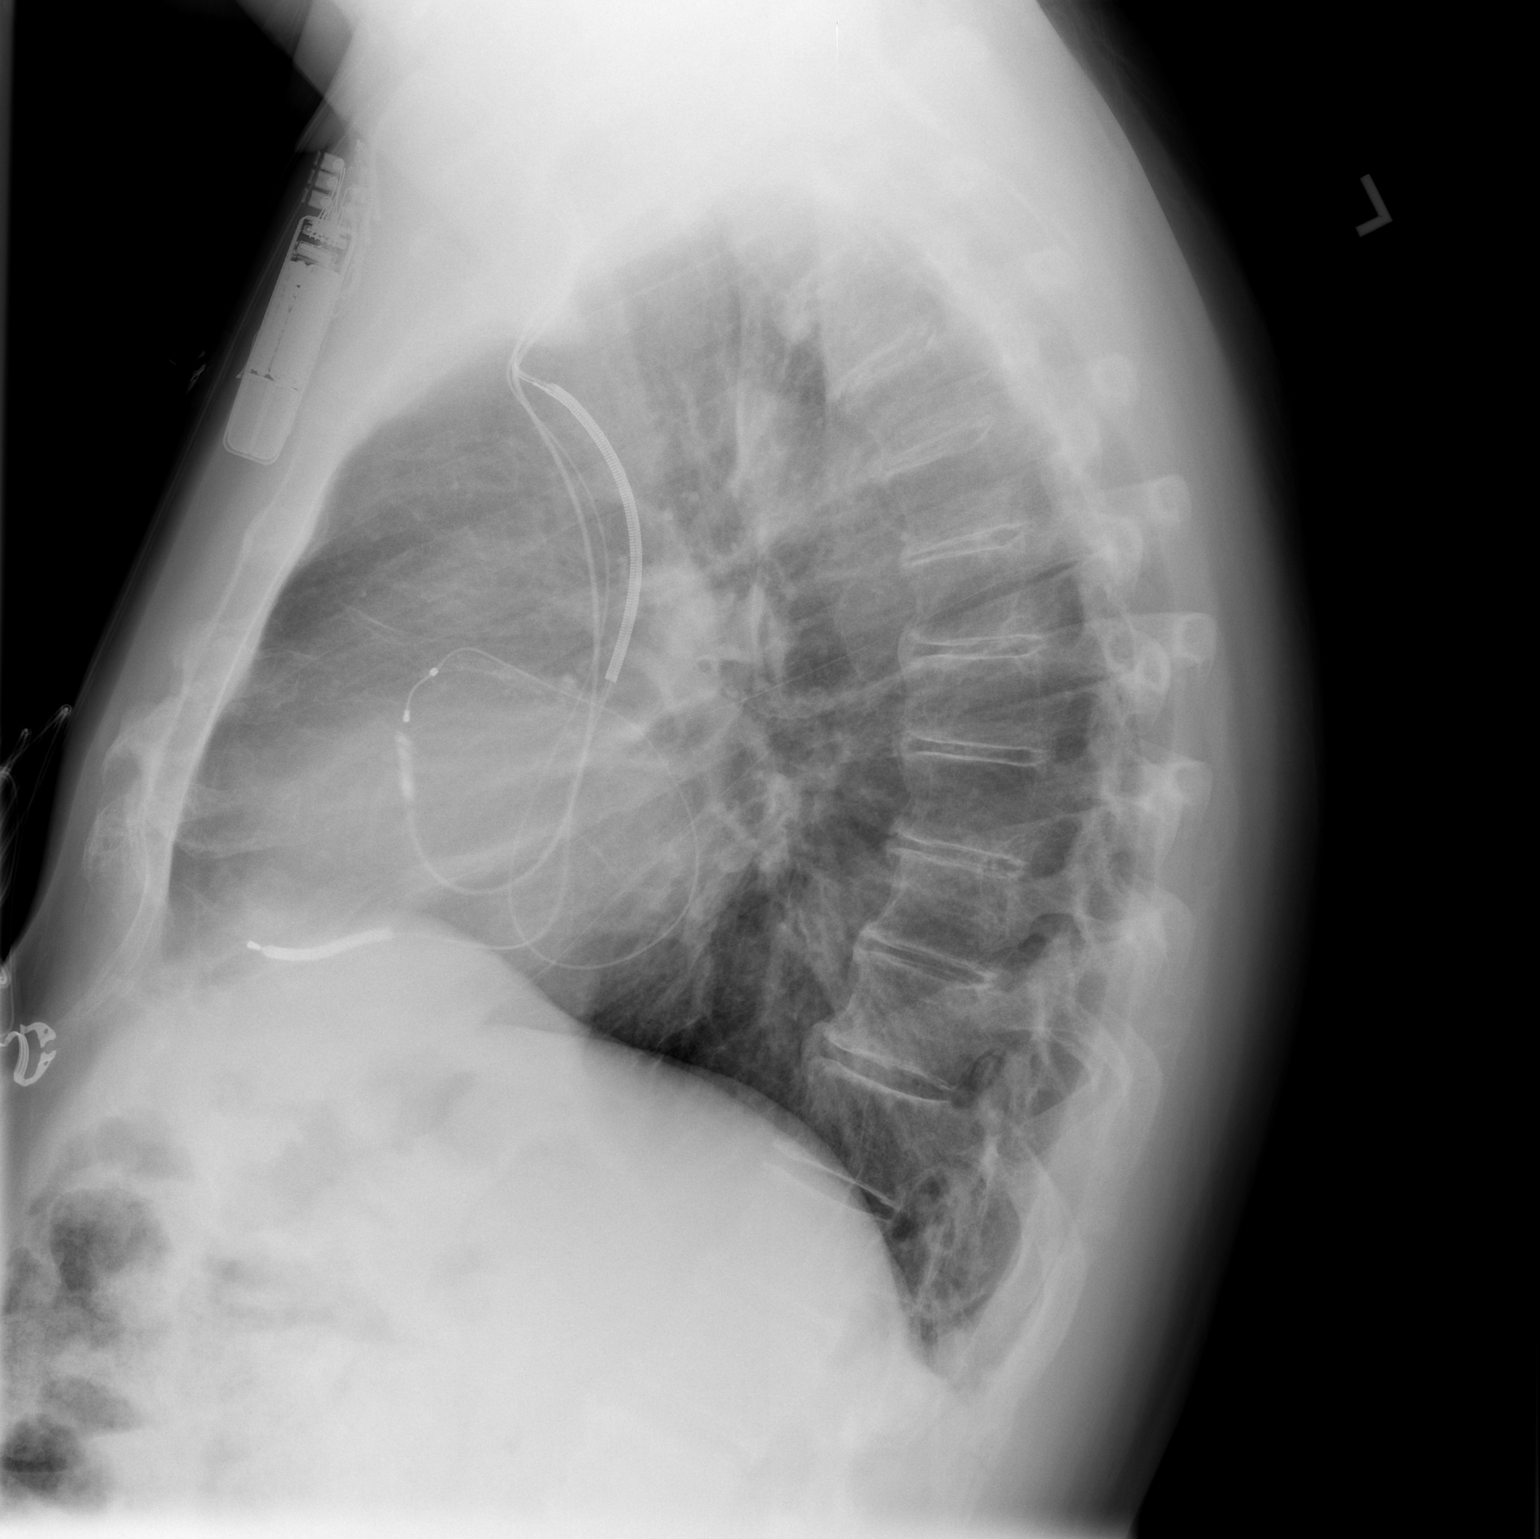

[2 of 2 positions shown; findings below may reference images not displayed]

FINDINGS: Two-view chest.  Stable left chest cardiac AICD.  Stable
cardiac size and mediastinal contours.  Stable lung volumes.  No
pneumothorax, pulmonary edema, pleural effusion or acute airspace
opacity. Stable visualized osseous structures.
IMPRESSION: Resolved interstitial edema. No acute cardiopulmonary abnormality.

## 2010-08-20 ENCOUNTER — Ambulatory Visit: Payer: Self-pay | Admitting: Oncology

## 2010-08-25 LAB — CBC WITH DIFFERENTIAL/PLATELET
Basophils Absolute: 0 10*3/uL (ref 0.0–0.1)
Eosinophils Absolute: 0.2 10*3/uL (ref 0.0–0.5)
HGB: 13.5 g/dL (ref 13.0–17.1)
MONO#: 0.6 10*3/uL (ref 0.1–0.9)
NEUT#: 4.1 10*3/uL (ref 1.5–6.5)
RBC: 3.99 10*6/uL — ABNORMAL LOW (ref 4.20–5.82)
RDW: 13.5 % (ref 11.0–14.6)
WBC: 6.1 10*3/uL (ref 4.0–10.3)
lymph#: 1.1 10*3/uL (ref 0.9–3.3)

## 2010-09-24 IMAGING — CR DG RIBS W/ CHEST 3+V*L*
4 series · 4 of 4 positions shown · non-contrast
Comparison: 02/07/2009

CLINICAL DATA: Recent fall.  Left lower rib pain.

LEFT RIBS AND CHEST - 3+ VIEW

[view not recorded (1 of 4)]
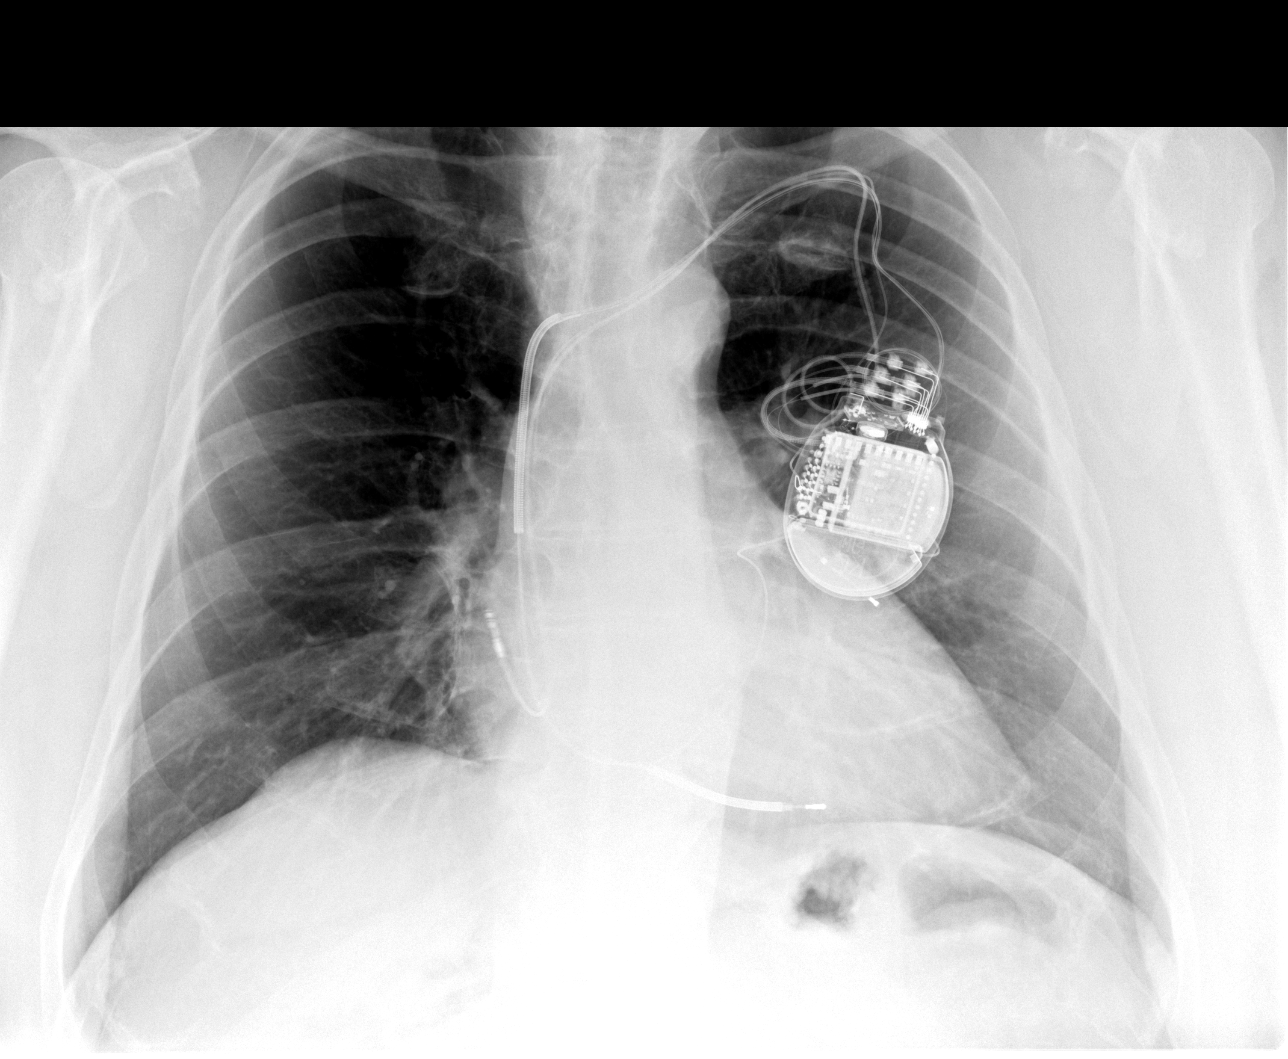

[view not recorded (2 of 4)]
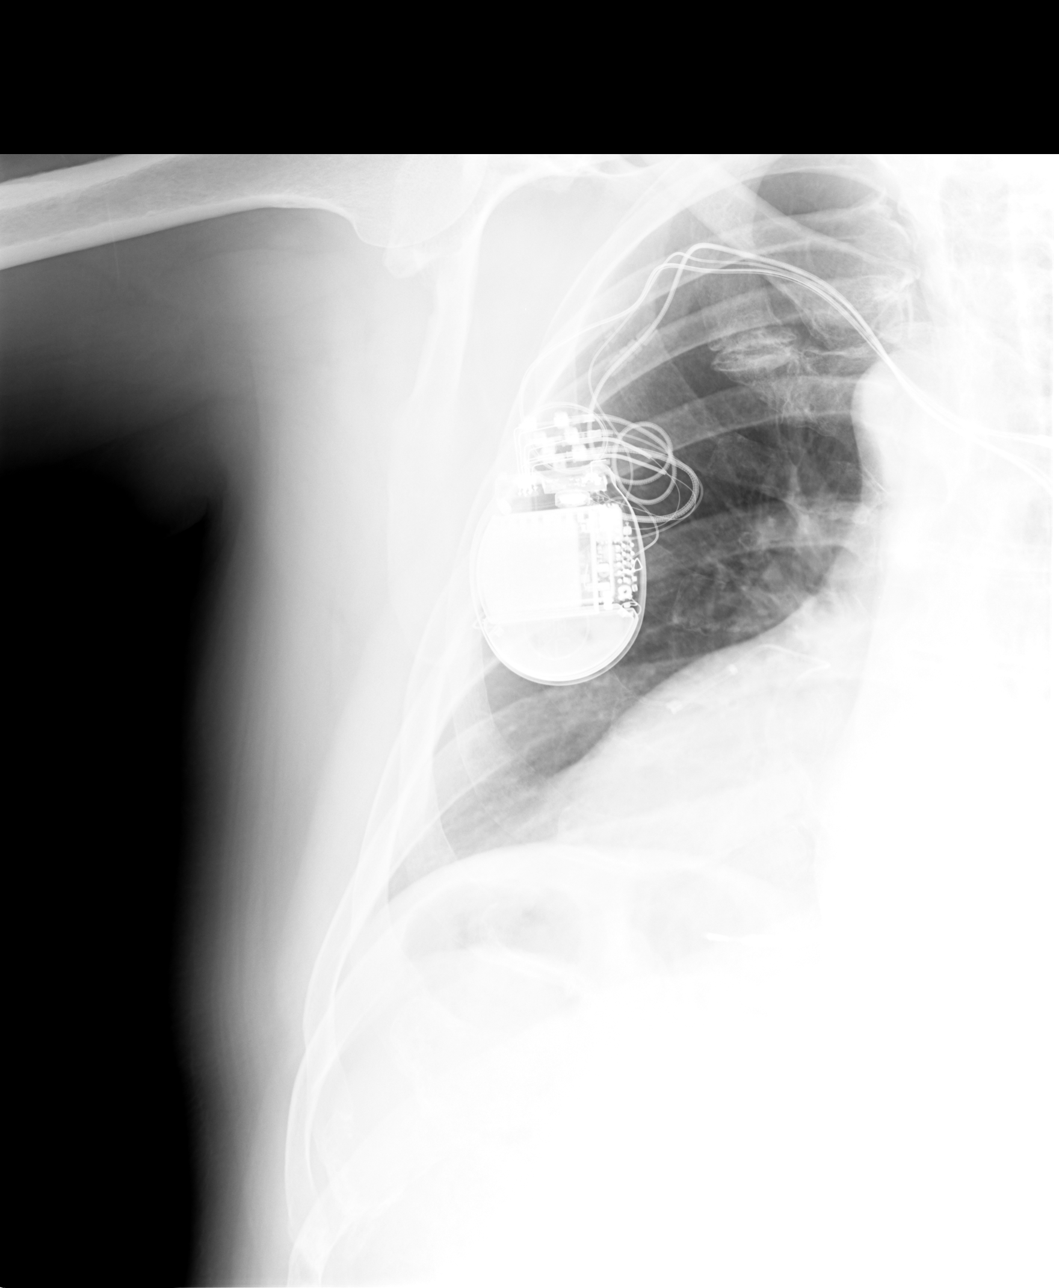

[view not recorded (3 of 4)]
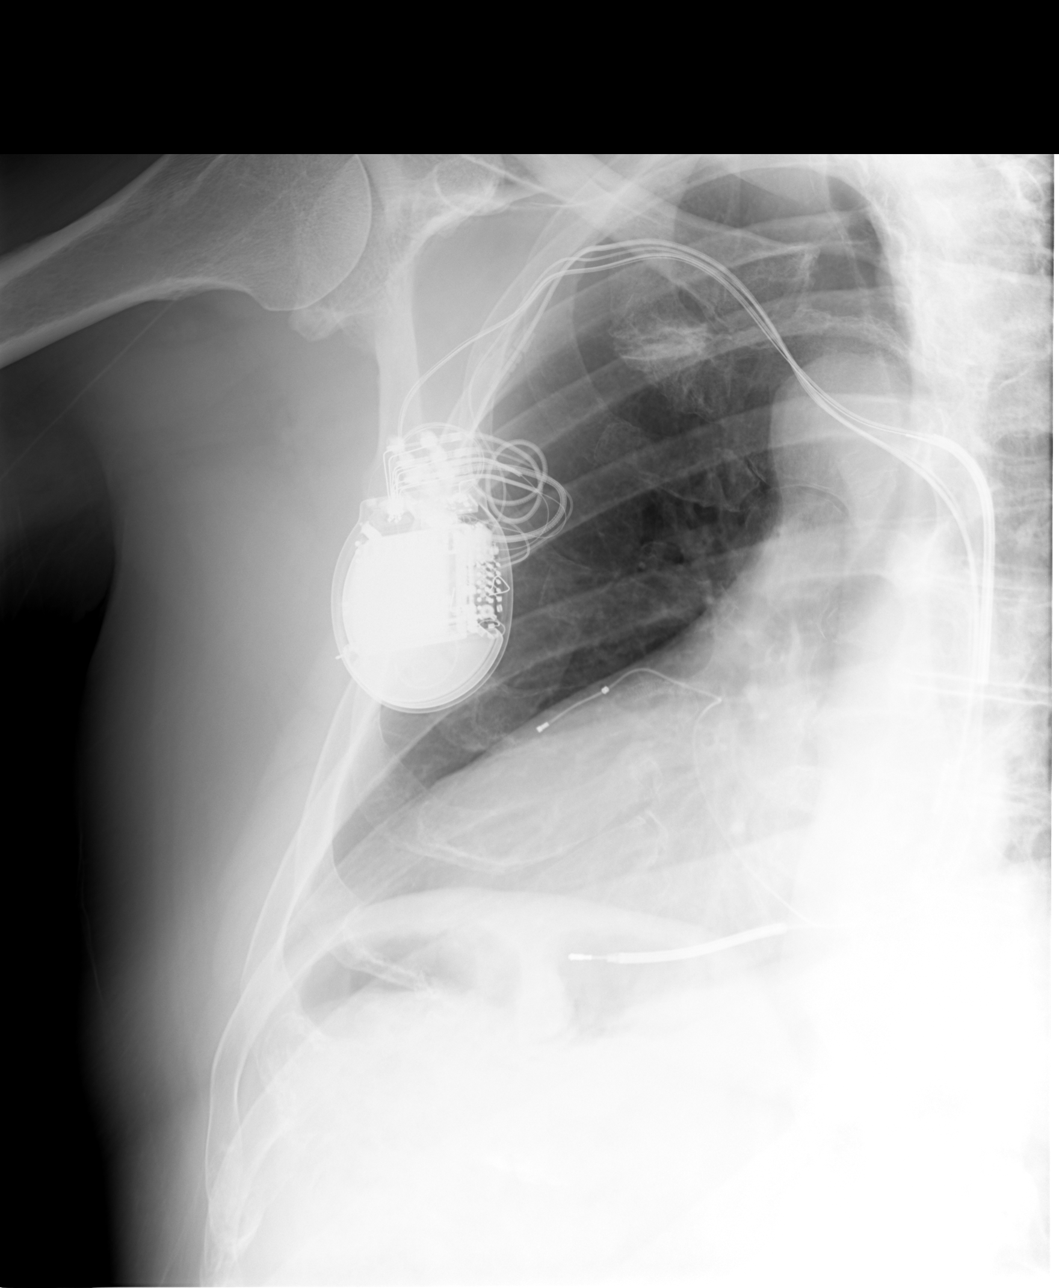

[view not recorded (4 of 4)]
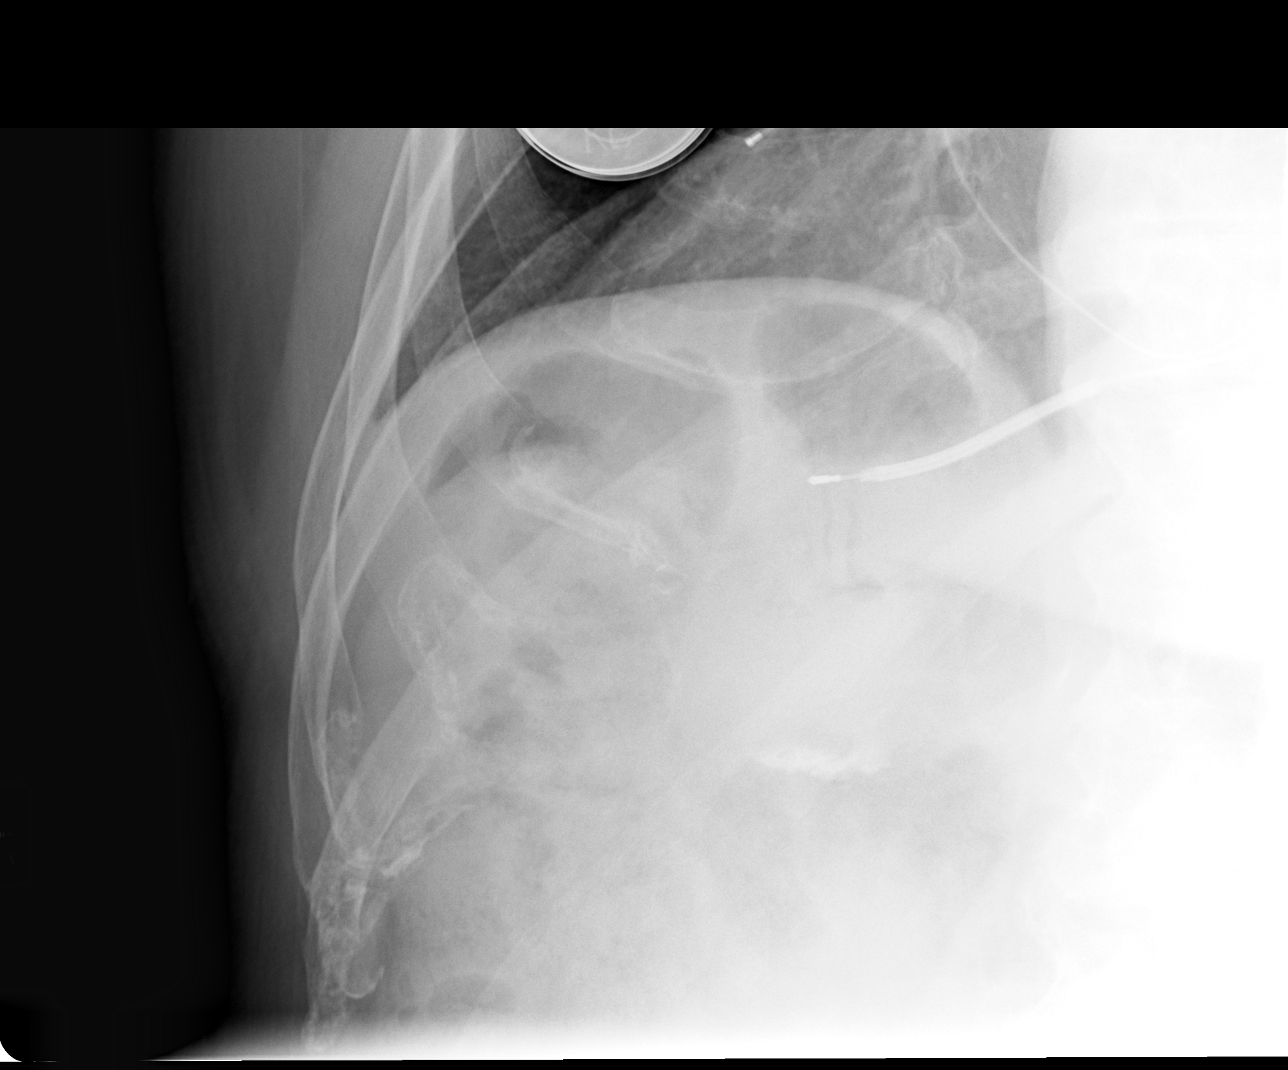

[4 of 4 positions shown; findings below may reference images not displayed]

FINDINGS: Normal cardiomediastinal silhouette.  AICD lead is in the
inferior aspect the right ventricle.  Right atrial and right
coronary sinus leads are also noted.  No acute cardiopulmonary
disease.  No acute left rib fracture.
IMPRESSION: No acute fracture.

## 2010-10-05 IMAGING — CT CT CHEST W/ CM
2 of 5 series · 16 of 46 positions shown, 18 images · IV contrast (agent unspecified)
Comparison: 05/20/2008

CT CHEST

CLINICAL DATA: Restaging rectal cancer.

CT CHEST AND ABDOMEN WITH CONTRAST
TECHNIQUE: Multidetector CT imaging of the chest and abdomen was
performed following the standard protocol during bolus
administration of intravenous contrast.
Contrast: 100 ml of Lmnipaque-3JJ

[Series 2: ca with st · axial · 0.90mm/px · z∈[+954,+1374]mm · 13 of 98 slices shown, 15 images]
[im 7/98  soft-tissue]
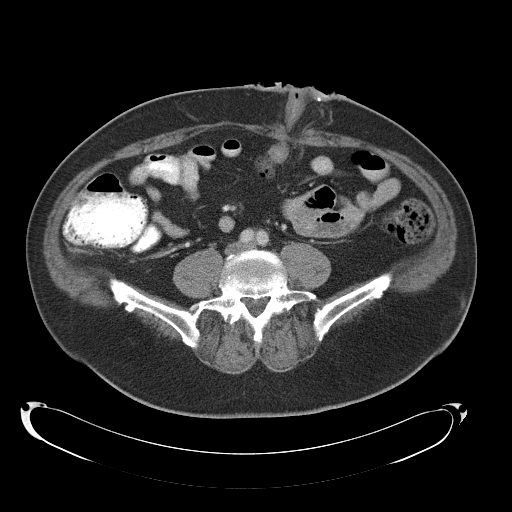
[im 7/98  bone]
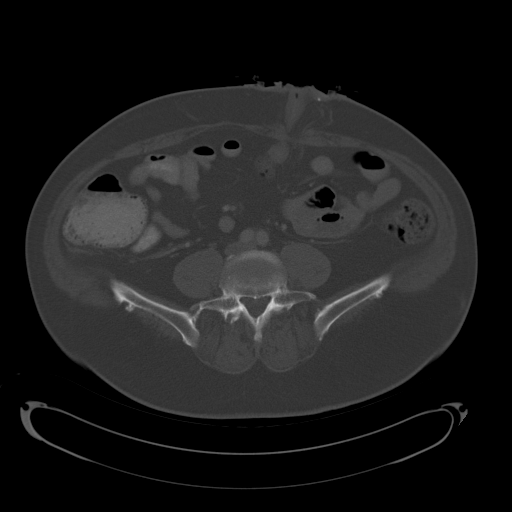
[im 13/98  soft-tissue]
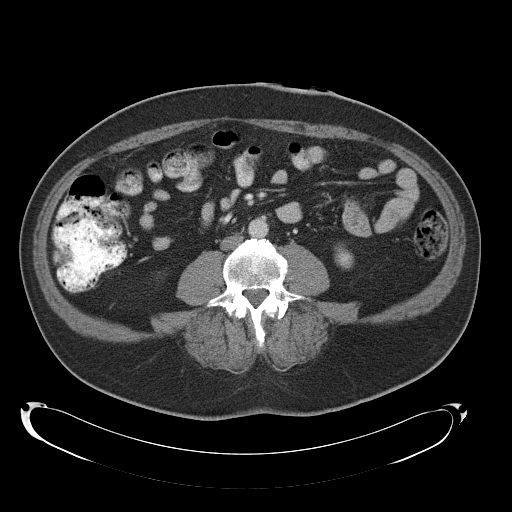
[im 19/98  soft-tissue]
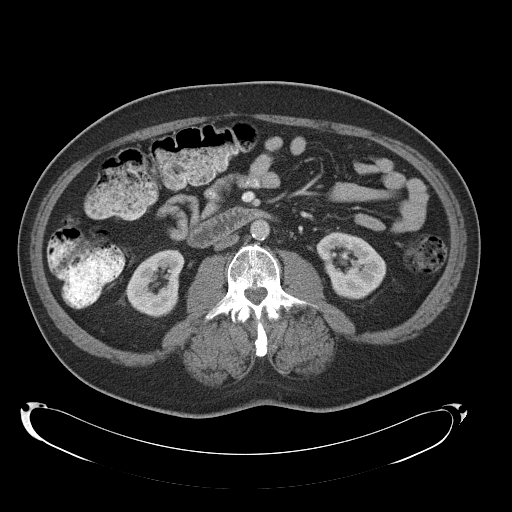
[im 31/98  soft-tissue]
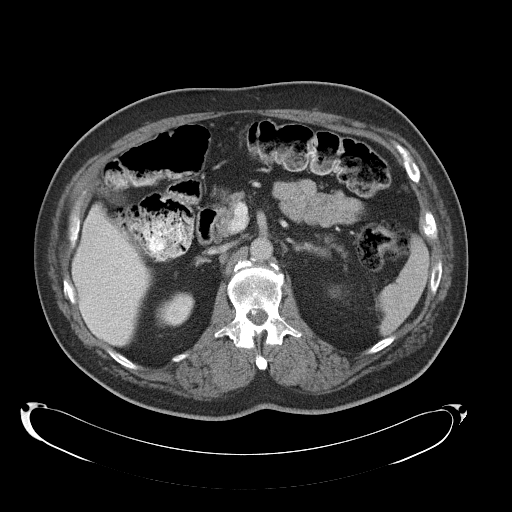
[im 37/98  soft-tissue]
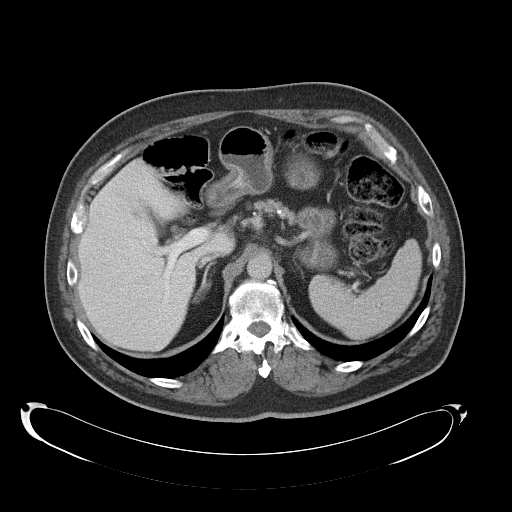
[im 43/98  soft-tissue]
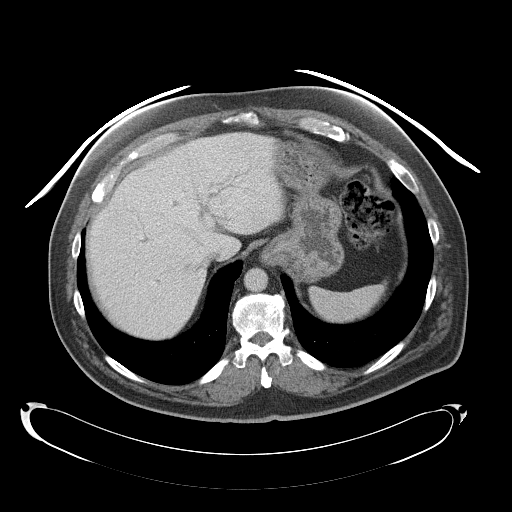
[im 49/98  soft-tissue]
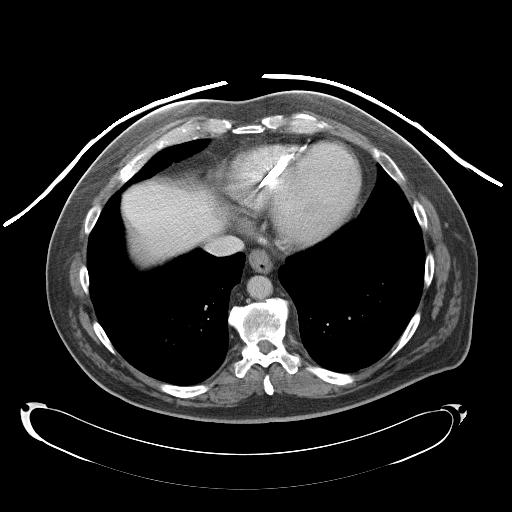
[im 55/98  soft-tissue]
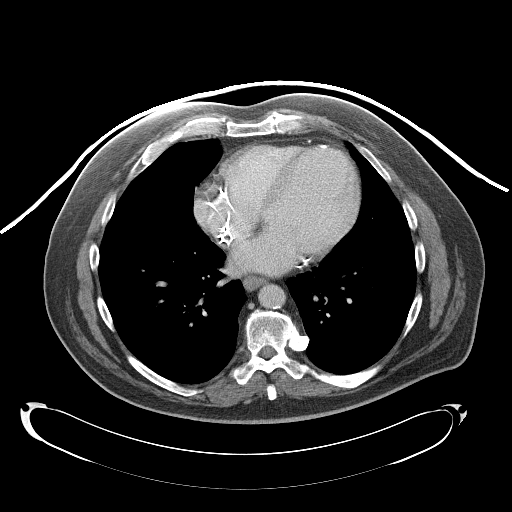
[im 61/98  soft-tissue]
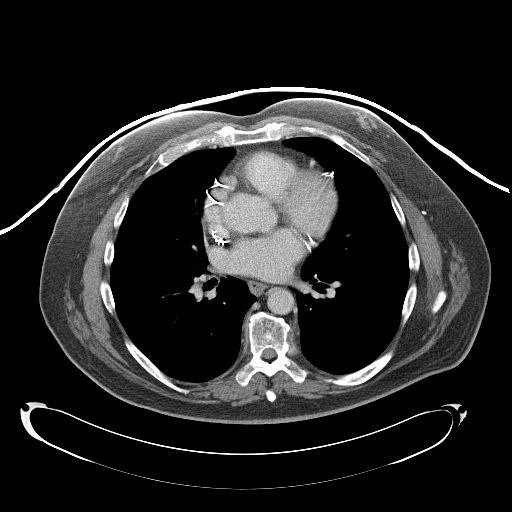
[im 61/98  bone]
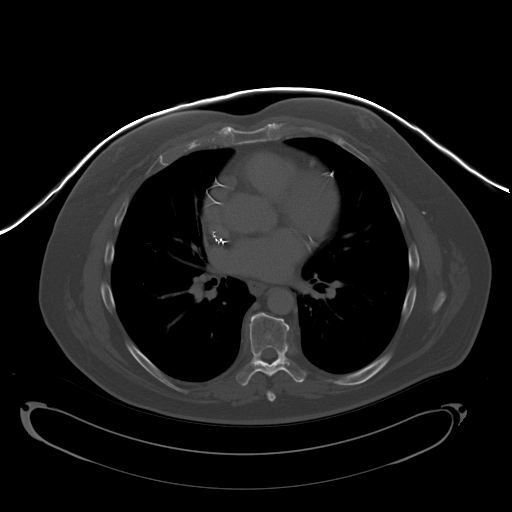
[im 67/98  soft-tissue]
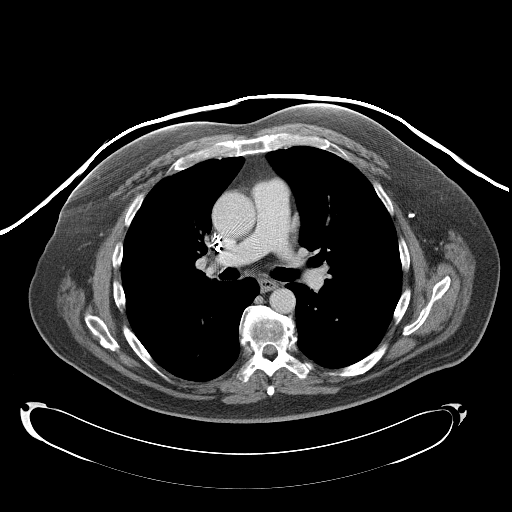
[im 79/98  soft-tissue]
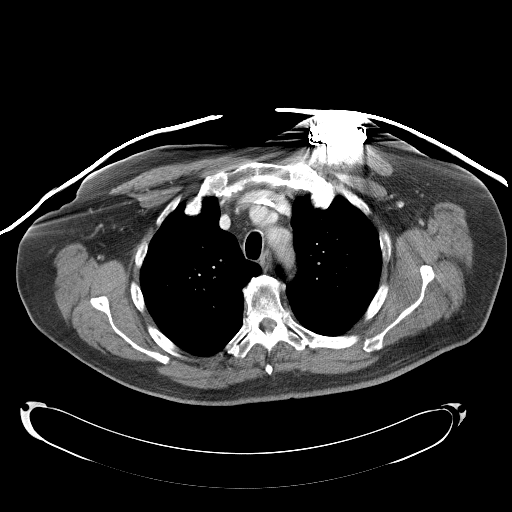
[im 85/98  soft-tissue]
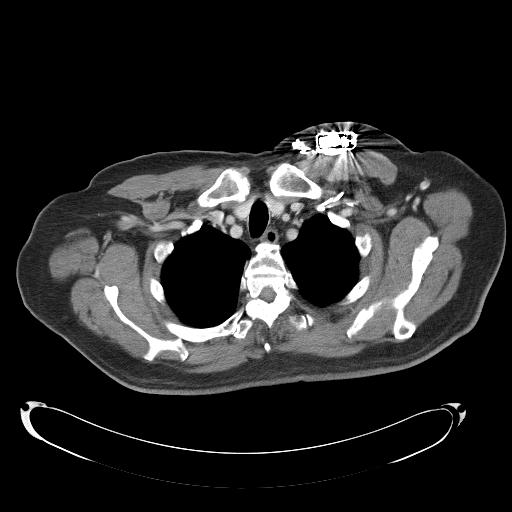
[im 91/98  soft-tissue]
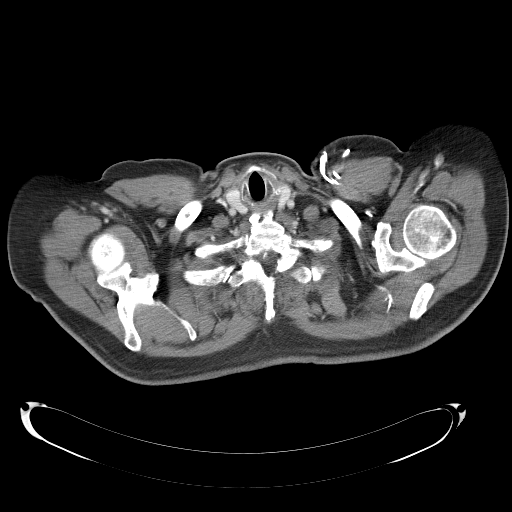

[Series 602: coronal images · coronal · 0.96mm/px · 3 of 104 slices shown]
[im 35/104  soft-tissue]
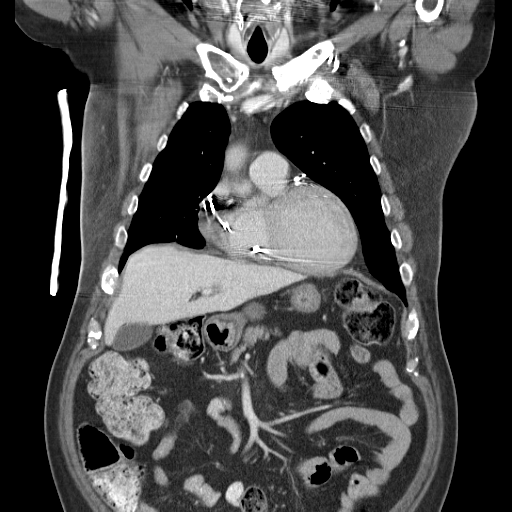
[im 46/104  soft-tissue]
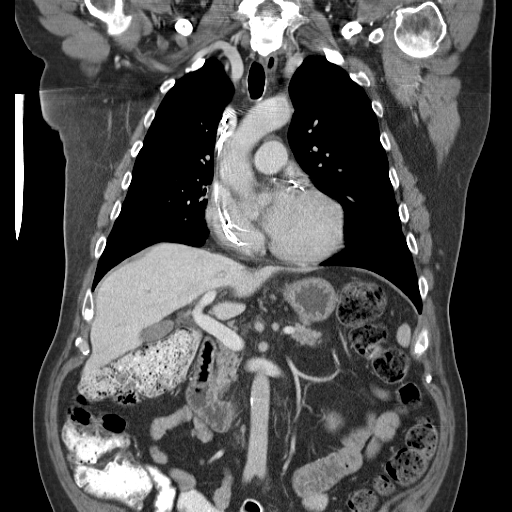
[im 58/104  soft-tissue]
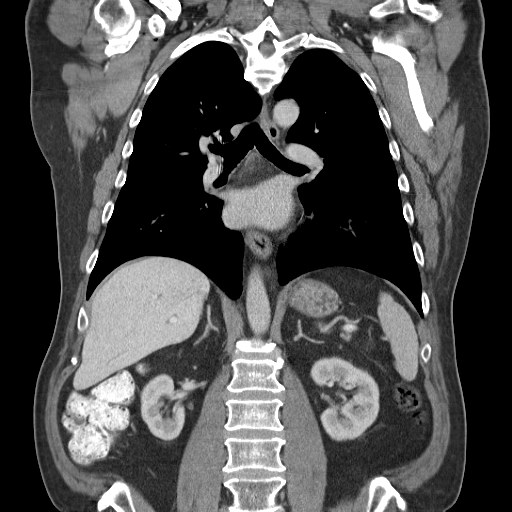

[16 of 46 positions shown; findings below may reference images not displayed]

FINDINGS: The chest wall is stable.  There is a permanent left-
sided pacemaker in place.  No supraclavicular or axillary
adenopathy.  The bony thorax is intact with stable degenerative
changes involving the spine.  No destructive bony lesions.

Small stable left thyroid lobe lesion.  The heart is normal in
size.  No pericardial effusion.  No mediastinal or hilar
adenopathy.  The esophagus is grossly normal.  The descending
thoracic aorta is normal in caliber.  No dissection. The ascending
aorta demonstrates stable fusiform dilatation with maximal
measurements of 4.0 x 4.0 cm.  The pulmonary arteries appear
normal.  There is a small hiatal hernia.

Examination of the lung parenchyma demonstrates no acute pulmonary
findings.  No pleural effusions.  No worrisome pulmonary mass
lesions.  There are a few tiny scattered pulmonary nodules which
are stable.
IMPRESSION: 1.  Unremarkable and stable CT appearance of the chest.  No
findings for metastatic disease or adenopathy.
2.  Stable small scattered pulmonary nodules.
3.  Stable mild fusiform aneurysmal dilatation of the ascending
thoracic aorta.

CT ABDOMEN
FINDINGS: The liver is unremarkable.  The gallbladder appears
normal.  No biliary dilatation.  The pancreas is unremarkable.  The
spleen is normal in size.  The adrenal glands and kidneys
demonstrate no significant abnormalities.  No hydronephrosis.

The stomach, duodenum, small bowel and colon demonstrate no
significant abnormalities.  Moderate stool throughout the colon.
The appendix is visualized and is normal.  No mesenteric or
retroperitoneal masses or adenopathy.  The aorta is normal in
caliber.  The major branch vessels are normal.  There is a left
lower quadrant colostomy noted.
IMPRESSION: Unremarkable and stable CT appearance of the abdomen.  No findings
to suggest metastatic abdominal disease.

## 2010-11-20 ENCOUNTER — Ambulatory Visit: Payer: Self-pay | Admitting: Oncology

## 2010-11-23 LAB — CBC WITH DIFFERENTIAL/PLATELET
BASO%: 0.4 % (ref 0.0–2.0)
HCT: 38.1 % — ABNORMAL LOW (ref 38.4–49.9)
MCHC: 34.3 g/dL (ref 32.0–36.0)
MONO#: 0.4 10*3/uL (ref 0.1–0.9)
NEUT#: 4 10*3/uL (ref 1.5–6.5)
RBC: 3.87 10*6/uL — ABNORMAL LOW (ref 4.20–5.82)
WBC: 5.4 10*3/uL (ref 4.0–10.3)
lymph#: 0.9 10*3/uL (ref 0.9–3.3)

## 2010-11-23 LAB — CEA: CEA: <0.5 ng/mL (ref 0.0–5.0)

## 2010-12-27 ENCOUNTER — Encounter: Payer: Self-pay | Admitting: Oncology

## 2011-01-05 NOTE — Letter (Signed)
Summary: Surgery-abd & perineal resection  Surgery-abd & perineal resection   Imported By: Minna Merritts 05/08/2010 16:49:09  _____________________________________________________________________  External Attachment:    Type:   Image     Comment:   External Document

## 2011-01-05 NOTE — Letter (Signed)
Summary: Dr Delon [place] Office Note  Dr Delon [place] Office Note   Imported By: Roderic Ovens 05/11/2010 14:34:04  _____________________________________________________________________  External Attachment:    Type:   Image     Comment:   External Document

## 2011-02-23 LAB — HEPATIC FUNCTION PANEL
ALT: 86 U/L — ABNORMAL HIGH (ref 0–53)
AST: 88 U/L — ABNORMAL HIGH (ref 0–37)
Bilirubin, Direct: 0.3 mg/dL (ref 0.0–0.3)
Total Bilirubin: 0.9 mg/dL (ref 0.3–1.2)

## 2011-02-23 LAB — POCT CARDIAC MARKERS
CKMB, poc: <1 ng/mL — ABNORMAL LOW (ref 1.0–8.0)
CKMB, poc: <1 ng/mL — ABNORMAL LOW (ref 1.0–8.0)
Myoglobin, poc: 45.6 ng/mL (ref 12–200)

## 2011-02-23 LAB — DIFFERENTIAL
Lymphocytes Relative: 9 % — ABNORMAL LOW (ref 12–46)
Lymphs Abs: 0.8 10*3/uL (ref 0.7–4.0)
Monocytes Absolute: 0.5 10*3/uL (ref 0.1–1.0)
Monocytes Relative: 7 % (ref 3–12)
Neutro Abs: 6.8 10*3/uL (ref 1.7–7.7)

## 2011-02-23 LAB — CBC
Hemoglobin: 13.8 g/dL (ref 13.0–17.0)
RBC: 3.94 MIL/uL — ABNORMAL LOW (ref 4.22–5.81)

## 2011-02-23 LAB — POCT I-STAT, CHEM 8
Creatinine, Ser: 0.9 mg/dL (ref 0.4–1.5)
Glucose, Bld: 134 mg/dL — ABNORMAL HIGH (ref 70–99)
Hemoglobin: 13.9 g/dL (ref 13.0–17.0)
Potassium: 4.2 mEq/L (ref 3.5–5.1)
TCO2: 21 mmol/L (ref 0–100)

## 2011-03-08 LAB — POCT CARDIAC MARKERS
CKMB, poc: <1 ng/mL — ABNORMAL LOW (ref 1.0–8.0)
Troponin i, poc: <0.05 ng/mL (ref 0.00–0.09)

## 2011-03-08 LAB — DIFFERENTIAL
Eosinophils Absolute: 0.2 10*3/uL (ref 0.0–0.7)
Eosinophils Relative: 4 % (ref 0–5)
Lymphs Abs: 0.8 10*3/uL (ref 0.7–4.0)
Monocytes Absolute: 0.5 10*3/uL (ref 0.1–1.0)
Monocytes Relative: 10 % (ref 3–12)

## 2011-03-08 LAB — APTT: aPTT: 36 seconds (ref 24–37)

## 2011-03-08 LAB — COMPREHENSIVE METABOLIC PANEL
ALT: 71 U/L — ABNORMAL HIGH (ref 0–53)
AST: 64 U/L — ABNORMAL HIGH (ref 0–37)
Albumin: 3.4 g/dL — ABNORMAL LOW (ref 3.5–5.2)
CO2: 23 mEq/L (ref 19–32)
Calcium: 9.2 mg/dL (ref 8.4–10.5)
GFR calc Af Amer: >60 mL/min (ref >60)
Sodium: 139 mEq/L (ref 135–145)
Total Protein: 6.6 g/dL (ref 6.0–8.3)

## 2011-03-08 LAB — CBC
MCHC: 33.4 g/dL (ref 30.0–36.0)
Platelets: 131 10*3/uL — ABNORMAL LOW (ref 150–400)
RBC: 4.21 MIL/uL — ABNORMAL LOW (ref 4.22–5.81)

## 2011-03-08 LAB — BRAIN NATRIURETIC PEPTIDE: Pro B Natriuretic peptide (BNP): 547 pg/mL — ABNORMAL HIGH (ref 0.0–100.0)

## 2011-03-17 LAB — POCT CARDIAC MARKERS
Myoglobin, poc: 69.1 ng/mL (ref 12–200)
Troponin i, poc: <0.05 ng/mL (ref 0.00–0.09)

## 2011-03-17 LAB — POCT I-STAT, CHEM 8
BUN: 26 mg/dL — ABNORMAL HIGH (ref 6–23)
Calcium, Ion: 1.24 mmol/L (ref 1.12–1.32)
Chloride: 108 mEq/L (ref 96–112)
Creatinine, Ser: 1.6 mg/dL — ABNORMAL HIGH (ref 0.4–1.5)
TCO2: 25 mmol/L (ref 0–100)

## 2011-03-18 LAB — BASIC METABOLIC PANEL
CO2: 28 mEq/L (ref 19–32)
Calcium: 8.9 mg/dL (ref 8.4–10.5)
Chloride: 102 mEq/L (ref 96–112)
GFR calc Af Amer: >60 mL/min (ref >60)
Potassium: 4.3 mEq/L (ref 3.5–5.1)
Sodium: 136 mEq/L (ref 135–145)

## 2011-03-18 LAB — POCT CARDIAC MARKERS: Myoglobin, poc: 65.4 ng/mL (ref 12–200)

## 2011-03-18 LAB — CBC
HCT: 37.8 % — ABNORMAL LOW (ref 39.0–52.0)
Hemoglobin: 13.1 g/dL (ref 13.0–17.0)
Hemoglobin: 14.3 g/dL (ref 13.0–17.0)
MCHC: 33.9 g/dL (ref 30.0–36.0)
MCHC: 34.6 g/dL (ref 30.0–36.0)
MCV: 96.2 fL (ref 78.0–100.0)
RBC: 3.88 MIL/uL — ABNORMAL LOW (ref 4.22–5.81)
RBC: 4.38 MIL/uL (ref 4.22–5.81)
RDW: 13.9 % (ref 11.5–15.5)

## 2011-03-18 LAB — COMPREHENSIVE METABOLIC PANEL
CO2: 28 mEq/L (ref 19–32)
Calcium: 9 mg/dL (ref 8.4–10.5)
Creatinine, Ser: 1.31 mg/dL (ref 0.4–1.5)
GFR calc Af Amer: >60 mL/min (ref >60)
GFR calc non Af Amer: 57 mL/min — ABNORMAL LOW (ref >60)
Glucose, Bld: 193 mg/dL — ABNORMAL HIGH (ref 70–99)
Sodium: 140 mEq/L (ref 135–145)
Total Protein: 6.2 g/dL (ref 6.0–8.3)

## 2011-03-18 LAB — PROTIME-INR
INR: 2.1 — ABNORMAL HIGH (ref 0.00–1.49)
INR: 2.3 — ABNORMAL HIGH (ref 0.00–1.49)
INR: 2.4 — ABNORMAL HIGH (ref 0.00–1.49)
Prothrombin Time: 25.2 seconds — ABNORMAL HIGH (ref 11.6–15.2)
Prothrombin Time: 26.3 seconds — ABNORMAL HIGH (ref 11.6–15.2)
Prothrombin Time: 27.5 seconds — ABNORMAL HIGH (ref 11.6–15.2)

## 2011-03-18 LAB — DIGOXIN LEVEL: Digoxin Level: 0.5 ng/mL — ABNORMAL LOW (ref 0.8–2.0)

## 2011-03-18 LAB — DIFFERENTIAL
Lymphocytes Relative: 9 % — ABNORMAL LOW (ref 12–46)
Lymphs Abs: 0.7 10*3/uL (ref 0.7–4.0)
Monocytes Relative: 7 % (ref 3–12)
Neutrophils Relative %: 82 % — ABNORMAL HIGH (ref 43–77)

## 2011-03-18 LAB — GLUCOSE, CAPILLARY

## 2011-03-18 LAB — TSH: TSH: 4.063 u[IU]/mL (ref 0.350–4.500)

## 2011-03-18 LAB — BRAIN NATRIURETIC PEPTIDE: Pro B Natriuretic peptide (BNP): 128 pg/mL — ABNORMAL HIGH (ref 0.0–100.0)

## 2011-03-18 LAB — CARDIAC PANEL(CRET KIN+CKTOT+MB+TROPI): CK, MB: 1.1 ng/mL (ref 0.3–4.0)

## 2011-03-18 LAB — D-DIMER, QUANTITATIVE: D-Dimer, Quant: <0.22 ug/mL-FEU (ref 0.00–0.48)

## 2011-03-18 LAB — TROPONIN I: Troponin I: 0.01 ng/mL (ref 0.00–0.06)

## 2011-03-18 LAB — MAGNESIUM: Magnesium: 2 mg/dL (ref 1.5–2.5)

## 2011-03-22 LAB — GLUCOSE, CAPILLARY
Glucose-Capillary: 115 mg/dL — ABNORMAL HIGH (ref 70–99)
Glucose-Capillary: 137 mg/dL — ABNORMAL HIGH (ref 70–99)
Glucose-Capillary: 149 mg/dL — ABNORMAL HIGH (ref 70–99)
Glucose-Capillary: 174 mg/dL — ABNORMAL HIGH (ref 70–99)
Glucose-Capillary: 178 mg/dL — ABNORMAL HIGH (ref 70–99)
Glucose-Capillary: 182 mg/dL — ABNORMAL HIGH (ref 70–99)
Glucose-Capillary: 186 mg/dL — ABNORMAL HIGH (ref 70–99)
Glucose-Capillary: 201 mg/dL — ABNORMAL HIGH (ref 70–99)
Glucose-Capillary: 205 mg/dL — ABNORMAL HIGH (ref 70–99)
Glucose-Capillary: 235 mg/dL — ABNORMAL HIGH (ref 70–99)

## 2011-03-22 LAB — BASIC METABOLIC PANEL
BUN: 17 mg/dL (ref 6–23)
BUN: 18 mg/dL (ref 6–23)
BUN: 19 mg/dL (ref 6–23)
CO2: 30 mEq/L (ref 19–32)
Calcium: 9.3 mg/dL (ref 8.4–10.5)
Calcium: 9.3 mg/dL (ref 8.4–10.5)
Calcium: 9.4 mg/dL (ref 8.4–10.5)
Creatinine, Ser: 1.15 mg/dL (ref 0.4–1.5)
Creatinine, Ser: 1.21 mg/dL (ref 0.4–1.5)
GFR calc non Af Amer: 57 mL/min — ABNORMAL LOW (ref >60)
GFR calc non Af Amer: >60 mL/min (ref >60)
GFR calc non Af Amer: >60 mL/min (ref >60)
Glucose, Bld: 137 mg/dL — ABNORMAL HIGH (ref 70–99)
Glucose, Bld: 157 mg/dL — ABNORMAL HIGH (ref 70–99)
Glucose, Bld: 176 mg/dL — ABNORMAL HIGH (ref 70–99)
Potassium: 4 mEq/L (ref 3.5–5.1)
Sodium: 138 mEq/L (ref 135–145)
Sodium: 139 mEq/L (ref 135–145)
Sodium: 140 mEq/L (ref 135–145)

## 2011-03-22 LAB — COMPREHENSIVE METABOLIC PANEL
BUN: 21 mg/dL (ref 6–23)
CO2: 27 mEq/L (ref 19–32)
Chloride: 107 mEq/L (ref 96–112)
Creatinine, Ser: 1.24 mg/dL (ref 0.4–1.5)
GFR calc non Af Amer: >60 mL/min (ref >60)
Total Bilirubin: 1.3 mg/dL — ABNORMAL HIGH (ref 0.3–1.2)

## 2011-03-22 LAB — PROTIME-INR
INR: 1.2 (ref 0.00–1.49)
INR: 1.3 (ref 0.00–1.49)
Prothrombin Time: 18.4 seconds — ABNORMAL HIGH (ref 11.6–15.2)

## 2011-03-22 LAB — DIFFERENTIAL
Basophils Absolute: 0 10*3/uL (ref 0.0–0.1)
Basophils Relative: 0 % (ref 0–1)
Lymphocytes Relative: 16 % (ref 12–46)
Neutro Abs: 3.9 10*3/uL (ref 1.7–7.7)

## 2011-03-22 LAB — TSH: TSH: 3.939 u[IU]/mL (ref 0.350–4.500)

## 2011-03-22 LAB — CBC
MCHC: 34 g/dL (ref 30.0–36.0)
Platelets: 161 10*3/uL (ref 150–400)
RDW: 14.2 % (ref 11.5–15.5)

## 2011-03-22 LAB — BRAIN NATRIURETIC PEPTIDE: Pro B Natriuretic peptide (BNP): 33 pg/mL (ref 0.0–100.0)

## 2011-03-22 LAB — MAGNESIUM: Magnesium: 2.2 mg/dL (ref 1.5–2.5)

## 2011-03-23 LAB — LIPID PANEL
HDL: 24 mg/dL — ABNORMAL LOW (ref >39)
LDL Cholesterol: 55 mg/dL (ref 0–99)
Triglycerides: 145 mg/dL (ref <150)

## 2011-03-23 LAB — DIFFERENTIAL
Basophils Absolute: 0 10*3/uL (ref 0.0–0.1)
Basophils Relative: 0 % (ref 0–1)
Eosinophils Absolute: 0.1 10*3/uL (ref 0.0–0.7)
Eosinophils Relative: 2 % (ref 0–5)
Monocytes Absolute: 0.5 10*3/uL (ref 0.1–1.0)
Monocytes Relative: 8 % (ref 3–12)
Neutro Abs: 5.1 10*3/uL (ref 1.7–7.7)

## 2011-03-23 LAB — URINALYSIS, ROUTINE W REFLEX MICROSCOPIC
Hgb urine dipstick: NEGATIVE
Nitrite: NEGATIVE
Protein, ur: NEGATIVE mg/dL
Specific Gravity, Urine: 1.028 (ref 1.005–1.030)
Urobilinogen, UA: 1 mg/dL (ref 0.0–1.0)

## 2011-03-23 LAB — COMPREHENSIVE METABOLIC PANEL
ALT: 39 U/L (ref 0–53)
Alkaline Phosphatase: 56 U/L (ref 39–117)
BUN: 13 mg/dL (ref 6–23)
Chloride: 103 mEq/L (ref 96–112)
Glucose, Bld: 115 mg/dL — ABNORMAL HIGH (ref 70–99)
Potassium: 3.7 mEq/L (ref 3.5–5.1)
Sodium: 139 mEq/L (ref 135–145)
Total Bilirubin: 0.9 mg/dL (ref 0.3–1.2)
Total Protein: 5.7 g/dL — ABNORMAL LOW (ref 6.0–8.3)

## 2011-03-23 LAB — GLUCOSE, CAPILLARY
Glucose-Capillary: 122 mg/dL — ABNORMAL HIGH (ref 70–99)
Glucose-Capillary: 134 mg/dL — ABNORMAL HIGH (ref 70–99)

## 2011-03-23 LAB — POCT CARDIAC MARKERS
CKMB, poc: <1 ng/mL — ABNORMAL LOW (ref 1.0–8.0)
Myoglobin, poc: 62.8 ng/mL (ref 12–200)
Troponin i, poc: <0.05 ng/mL (ref 0.00–0.09)

## 2011-03-23 LAB — CK TOTAL AND CKMB (NOT AT ARMC)
Relative Index: INVALID (ref 0.0–2.5)
Total CK: 46 U/L (ref 7–232)

## 2011-03-23 LAB — TSH: TSH: 2.07 u[IU]/mL (ref 0.350–4.500)

## 2011-03-23 LAB — CARDIAC PANEL(CRET KIN+CKTOT+MB+TROPI)
Relative Index: INVALID (ref 0.0–2.5)
Total CK: 48 U/L (ref 7–232)
Total CK: 79 U/L (ref 7–232)

## 2011-03-23 LAB — AMYLASE: Amylase: 46 U/L (ref 27–131)

## 2011-03-23 LAB — CBC
MCHC: 34.7 g/dL (ref 30.0–36.0)
MCV: 96.6 fL (ref 78.0–100.0)
Platelets: 144 10*3/uL — ABNORMAL LOW (ref 150–400)
RBC: 3.81 MIL/uL — ABNORMAL LOW (ref 4.22–5.81)

## 2011-03-23 LAB — APTT
aPTT: 35 seconds (ref 24–37)
aPTT: 37 seconds (ref 24–37)

## 2011-03-23 LAB — D-DIMER, QUANTITATIVE: D-Dimer, Quant: 0.31 ug/mL-FEU (ref 0.00–0.48)

## 2011-03-23 LAB — BRAIN NATRIURETIC PEPTIDE: Pro B Natriuretic peptide (BNP): 100 pg/mL (ref 0.0–100.0)

## 2011-04-07 IMAGING — CR DG CHEST 2V
2 series · 2 of 2 positions shown · non-contrast
Comparison: Chest CTs and radiographs 05/26/2009 and earlier.

CLINICAL DATA: 53-year-old male with cough, wheezing, shortness of
breath.
History of rectal cancer.

CHEST - 2 VIEW

[view not recorded (1 of 2)]
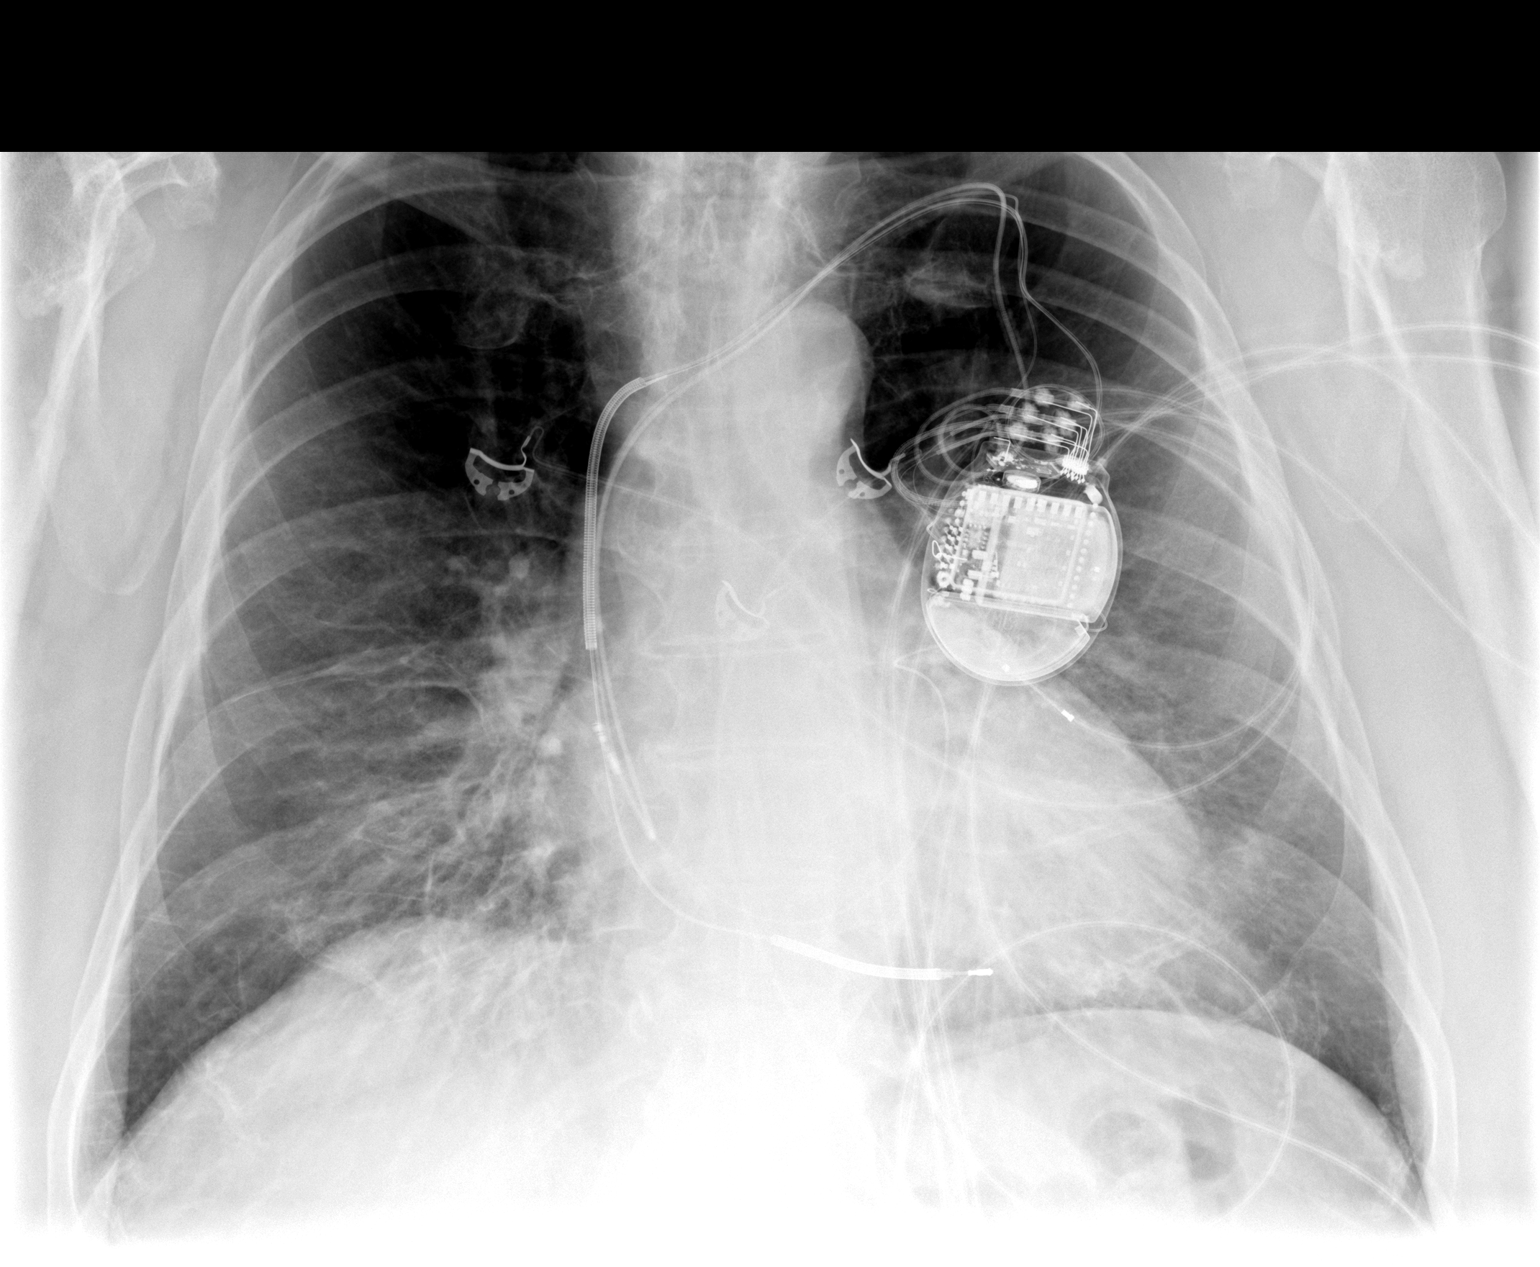

[view not recorded (2 of 2)]
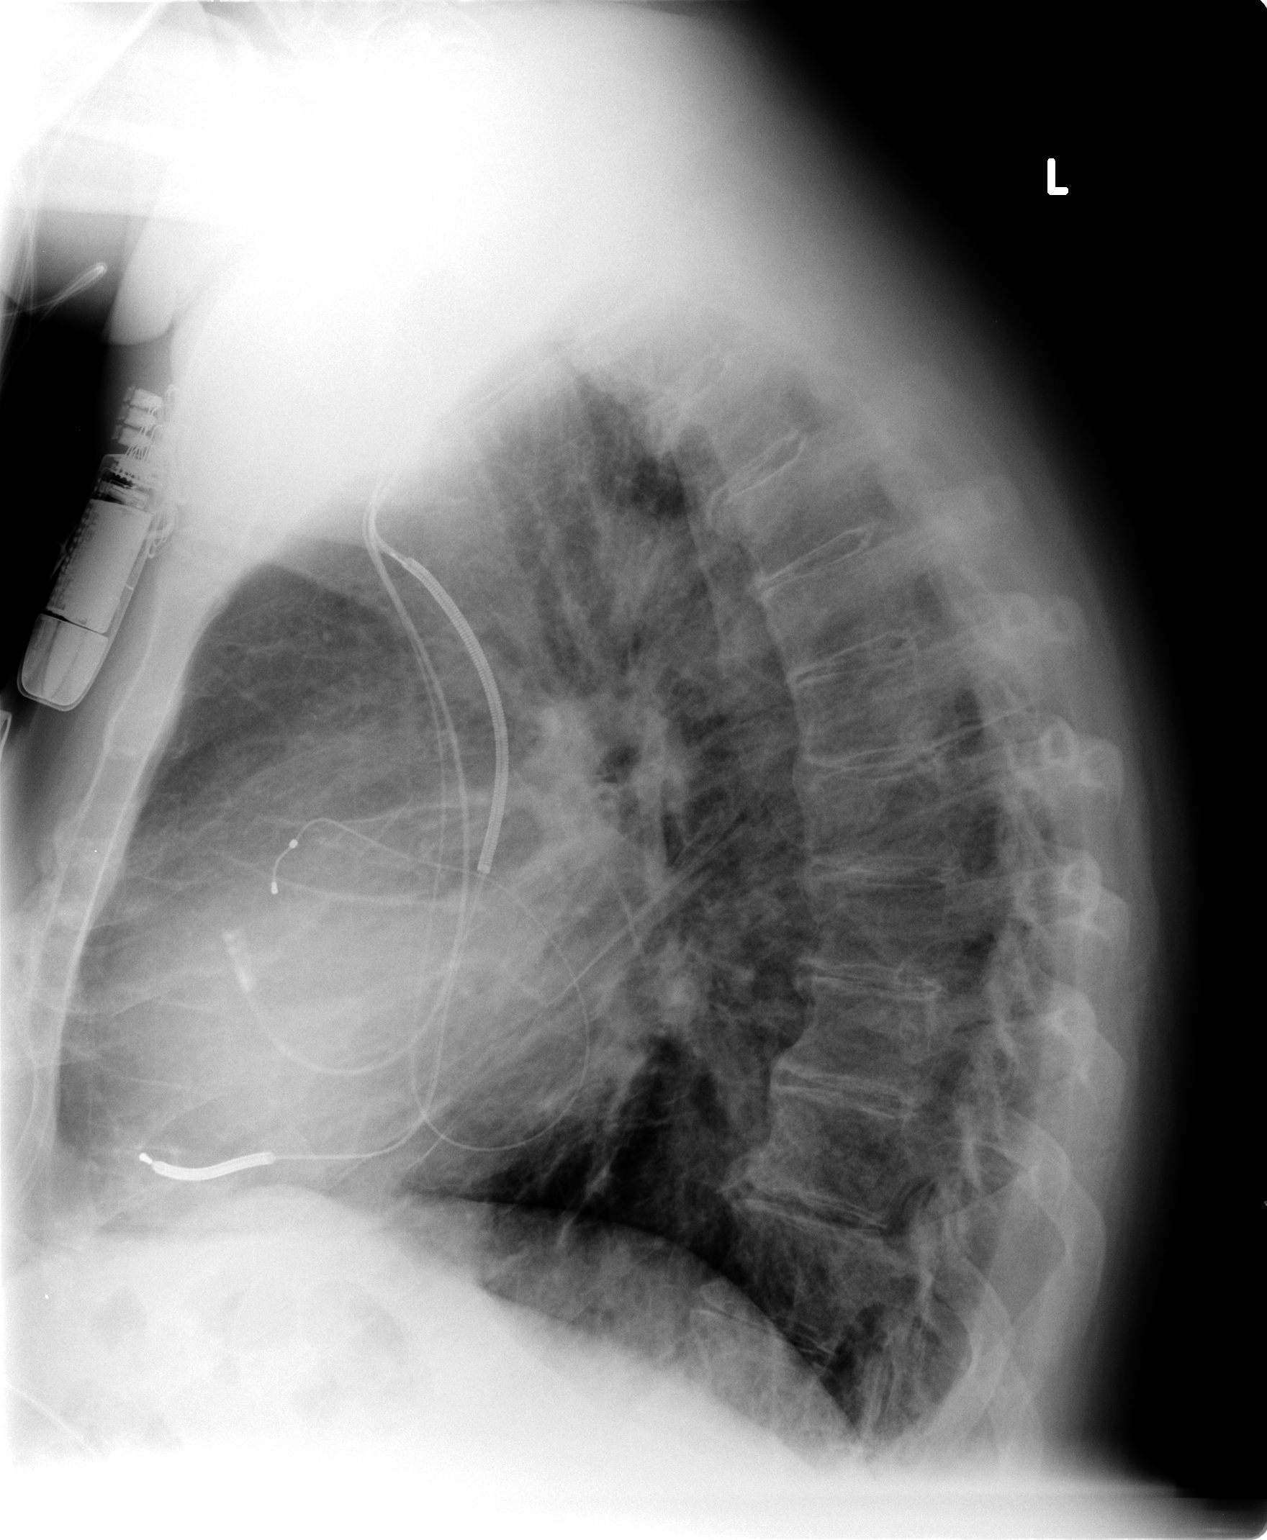

[2 of 2 positions shown; findings below may reference images not displayed]

FINDINGS: Stable left chest cardiac AICD. Stable cardiomegaly and
mediastinal contours.  No pneumothorax, pleural effusion, or
pulmonary edema.  There is increased streaky right basilar opacity.
There are similar but less pronounced changes also at the left
base.  No consolidation. Visualized tracheal air column is within
normal limits.  Stable visualized osseous structures.
IMPRESSION: Right greater than left streaky infrahilar / basilar opacity is
nonspecific but could reflect atelectasis or developing
infection/bronchopneumonia. Clinical correlation recommended.

## 2011-04-20 NOTE — H&P (Signed)
NAMETREYLIN, BURTCH                ACCOUNT NO.:  [id number]   MEDICAL RECORD NO.:  [id number]          PATIENT TYPE:  AMB   LOCATION:  DAY                           FACILITY:  [facility]   PHYSICIAN:  R. Roetta Sessions, M.D. DATE OF BIRTH:  24-Mar-1956   DATE OF ADMISSION:  DATE OF DISCHARGE:  LH                              HISTORY & PHYSICAL   SURGEON:  Angelia Mould. Derrell Lolling, MD   PRIMARY GASTROENTEROLOGIST:  Jonathon Bellows, MD.   PRIMARY CARE PHYSICIAN:  [name]. [name], MD   CHIEF COMPLAINT:  To set up colonoscopy.   HISTORY OF PRESENT ILLNESS:  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  He has been doing very well since his  surgery.  He denies any blood in his stool or melena.  He denies any  abdominal pain.  Denies any nausea or vomiting.  Otherwise, he has  remained stable.  Interestingly, both his mother and his sister were  diagnosed with colon cancer this year after his diagnosis.  He tells me  he was sent for genetic testing in [place], and he and his family  members are undergoing this currently.  So far, the tests have not  showed a genetic link for FAP.   PAST MEDICAL AND SURGICAL HISTORY:  He was diagnosed with prostate  carcinoma at the same time of his colorectal carcinoma.  He did undergo  radiation and chemotherapy for that.  He has history of invasive  adenocarcinoma of the rectum stage T3 N2 status post neoadjuvant  chemoradiation, APR, and postop adjuvant chemotherapy.  He has had  coronary disease, MI, and pacemaker defibrillator.  He has history of  hypertension and diabetes mellitus.   CURRENT MEDICATIONS:  1. Niaspan 1 g nightly.  2. Lipitor 10 mg nightly.  3. Benazepril 20 mg b.i.d.  4. Spironolactone 25 mg daily.  5. Furosemide 20 mg daily.  6. Nitro-Bid 0.4 mg p.r.n.  7.  Aspirin 81 mg daily.  8. Metoprolol 100 mg b.i.d.  9. Actos 45 mg daily.  10.Metformin 2 g daily.  11.Digoxin 125 mcg daily.  12.Fish oil 2 capsules daily.  13.Coenzyme Q10 50 mg daily.  14.Vitamin C 500 mg daily.  15.Vitamin E 400 International Units daily.   ALLERGIES:  No known drug allergies.   FAMILY HISTORY:  Positive for mother diagnosed at age 7 and sister  diagnosed at age 4 with colon cancer.  Father aged 78 deceased  secondary to coronary disease.  He has another healthy sister and  brother.   SOCIAL HISTORY:  Mr. Shaler is married.  He has 1 son.  He is disabled.  He denies tobacco, alcohol, or drug use.   REVIEW OF SYSTEMS:  See HPI, otherwise, negative.   PHYSICAL EXAMINATION:  VITAL SIGNS:  Weight 248 pounds, height 71  inches, temp 97, blood pressure 110/70, and pulse 64.  GENERAL:  He is an obese Caucasian male who is alert and oriented,  in  pleasant and cooperative mood, and in no acute distress.  HEENT:  Sclerae clear, nonicteric.  Conjunctivae pink.  Oral mucosa is  pink and moist without any lesions.  NECK:  Supple without any mass or thyromegaly.  CHEST:  Heart regular rate and rhythm.  Normal S1 and S2.  He does have  a pacemaker in place.  LUNGS:  Clear to auscultation bilaterally.  ABDOMEN:  Positive bowel sounds.  He does have a colostomy bag in place.  Abdomen is soft, nontender, and nondistended without palpable mass or  hepatosplenomegaly.  No rebound, tenderness, or guarding.  EXTREMITIES:  Without clubbing or edema.   IMPRESSION:  [name] is a 55 year old Caucasian male with a history  of adenocarcinoma of the rectum status post neoadjuvant chemoradiation,  APR, and postop adjuvant chemotherapy.  He has done very well.  He is  due for surveillance colonoscopy.   PLAN:  1. Colonoscopy with Dr. Jena Gauss in the near future.  I discussed the      procedure including risks and benefits which      include but not limited to infection,  perforation, and drug      reaction.  He agrees with the plan and consent was obtained.  2. Further recommendations pending procedure.      Lorenza Burton, N.P.      Jonathon Bellows, M.D.  Electronically Signed    KJ/MEDQ  D:  08/02/2008  T:  08/03/2008  Job:  [id number]   cc:   R. Roetta Sessions, M.D.  [address]  [place]  [place] [address]   [name]. [name], MD  Fax: [phone]   Angelia Mould. Derrell Lolling, M.D.  [address], [address]  [place]  [place] [address]   Leighton Roach. Truett Perna, M.D.  Fax: [phone]   Richard A. Alanda Amass, M.D.  Fax: [phone]

## 2011-04-20 NOTE — Op Note (Signed)
[name], [name]                ACCOUNT NO.:  [id number]   MEDICAL RECORD NO.:  [id number]          PATIENT TYPE:  AMB   LOCATION:  DAY                           FACILITY:  [facility]   PHYSICIAN:  R. Roetta Sessions, M.D. DATE OF BIRTH:  24-Mar-1956   DATE OF PROCEDURE:  05/17/2007  DATE OF DISCHARGE:                               OPERATIVE REPORT   PROCEDURE:  Colonoscopy with biopsy.   INDICATIONS FOR PROCEDURE:  This is a 55 year old gentleman with  intermittent hematochezia of 1 year's duration.  He has never had his  lower GI tract imaged.  There is no family history of colorectal  neoplasia.  Colonoscopy is now being performed.  This approach has been  discussed with the patient at length.  Potential risks, benefits and  alternatives have been reviewed and questions answered.  He is  agreeable.  Please see documentation in the medical record.   PROCEDURE NOTE:  Oxygen saturation, blood pressure, pulse and  respiration were monitored throughout the entire procedure.   CONSCIOUS SEDATION:  Versed 6 mg IV and Demerol 25 mg IV in divided  doses.   INSTRUMENT:  Pentax video chip system.   Digital rectal exam revealed a palpable mass at the tip of the  examiner's finger.   ENDOSCOPIC FINDINGS:  The prep was adequate.  Examination of the rectum  revealed a large, fungating, semilunar apple-core type neoplastic  process beginning at 5 cm from the anal verge.  This corkscrewed up  another 5 cm.  Please see multiple photos.  This was an exophytic, hard,  friable lesion consistent with carcinoma.  I was able to retroflex.  The  distal 5 cm of the rectal mucosa appeared normal, however.   Colon:  Colonic mucosa was surveyed from the rectosigmoid junction  through the left transverse, right colon to the area of the appendiceal  orifice and ileocecal valve and cecum.  These structures were well seen  and photographed for the record.  From this level, the scope was slowly  and cautiously  withdrawn.  All previously imaged mucosal surfaces were  again seen.  The colonic mucosa appeared normal.  The scope was pulled  back down in the rectum where utilizing the jumbo biopsy forceps the  lesion described earlier was biopsied multiple times.  The patient  tolerated the procedure extremely well and was reactive after endoscopy.   IMPRESSION:  Semilunar, apple-core neoplasm low in the rectum (palpable  on digital rectal exam) beginning at 5 cm and corkscrewing up 5 cm in  length.  This was a low rectal lesion consistent with colorectal  carcinoma.  It was biopsied multiple times.  The upstream colon all the  way to the cecum appeared normal.   RECOMMENDATIONS:  1. Followup on path.  2. The patient will need surgical consultation and would recommend he      see a surgeon in [place] where he would have 24/7 cardiology      coverage given his multiple comorbidities.      Jonathon Bellows, M.D.  Electronically Signed     RMR/MEDQ  D:  05/17/2007  T:  05/17/2007  Job:  [id number]   cc:   Gerlene Burdock A. Alanda Amass, M.D.  Fax: [phone]   Vincent Gros. [name], MD  Fax: [phone]

## 2011-04-20 NOTE — Op Note (Signed)
[name], [name]                ACCOUNT NO.:  [id number]   MEDICAL RECORD NO.:  [id number]          PATIENT TYPE:  AMB   LOCATION:  SDS                          FACILITY:  [facility]   PHYSICIAN:  Currie Paris, M.D.DATE OF BIRTH:  1956-07-20   DATE OF PROCEDURE:  06/08/2007  DATE OF DISCHARGE:                               OPERATIVE REPORT   PREOPERATIVE DIAGNOSIS:  Carcinoma of the rectum.   POSTOPERATIVE DIAGNOSIS:  Carcinoma of the rectum.   OPERATION:  Port-A-Cath placement.   SURGEON:  Currie Paris, M.D.   ANESTHESIA:  MAC.   CLINICAL HISTORY:  Mr. Kiser is a 55 year old gentleman getting ready  to undergo preoperative chemo and radiation for carcinoma of the rectum.  He needed IV access for his chemo.   DESCRIPTION OF PROCEDURE:  The patient was seen in the holding area and  had no further questions.  We confirmed that Port-A-Cath placement was  the planned procedure and we were going to attempt the right side since  he had a pacemaker in the left subclavian.  The patient was taken to the  operating room and after satisfactory IV sedation, the upper chest was  clipped, prepped and draped.  The time out occurred.  I infiltrated 1%  Xylocaine plain in the right infraclavicular area and on the second  attempt was able to get into the vein and get good back flow.  The  guidewire was threaded easily and fluoroscopy confirmed position in the  superior vena cava.   Additional local was infiltrated on the anterior chest wall and a  transverse incision was made.  A subcutaneous pocket was fashioned with  the cautery.  I then took the Port-A-Cath tubing and brought it from the  pocket area into the guidewire site.  The guidewire tract was dilated  once with the dilator peel away sheath and then the dilator and  guidewire were removed and the catheter threaded easily to approximately  19 cm.  The peel away sheath was removed.  This aspirated and flushed  easily.   Using fluoroscopy, I saw that we were in the right atrium, so this was  backed up to 16 cm where I appeared to be in the distal superior vena  cava.  Again, the catheter aspirated and irrigated easily.  The Port-A-  Cath reservoir was flushed, attached, and the locking mechanism engaged.  This aspirated and flushed easily.  The reservoir was sutured  to the fascia with 3-0 Vicryl sutures.  Final fluoroscopy showed good  positioning and no kinking.  The incision was closed with 3-0 Vicryl, 4-  0 Monocryl subcuticular, and Dermabond.   The patient tolerated the procedure well with no operative  complications.  All counts were correct.      Currie Paris, M.D.  Electronically Signed     CJS/MEDQ  D:  06/08/2007  T:  06/08/2007  Job:  [id number]

## 2011-04-20 NOTE — Discharge Summary (Signed)
[name], [name]                ACCOUNT NO.:  [id number]   MEDICAL RECORD NO.:  [id number]          PATIENT TYPE:  INP   LOCATION:  4712                         FACILITY:  [facility]   PHYSICIAN:  Angelia Mould. Derrell Lolling, M.D.DATE OF BIRTH:  04/17/1956   DATE OF ADMISSION:  10/04/2007  DATE OF DISCHARGE:  10/15/2007                               DISCHARGE SUMMARY   FINAL DIAGNOSIS:  1. Adenocarcinoma of the distal rectum, stage T3, N2.  2. Ischemic cardiomyopathy with low ejection fraction.  3. Internal defibrillator and pacemaker.  4. Diabetes mellitus.  5. Hypertension.   OPERATION PERFORMED:  Abdominal and perineal resection of the rectum  with total mesorectal excision date October 04, 2007.   HISTORY:  This is a 55 year old white man who noticed some blood in his  stools but otherwise was asymptomatic other than slightly smaller stool  caliber.  Dr. Roetta Sessions performed a colonoscopy on May 17, 2007 and  he found a large fungating, apple core type of malignancy beginning at 4-  5 cm from the anal verge and extending up for another 5 cm.  A biopsy  showed adenocarcinoma.  The rest of the colonoscopy all the way to the  cecum was normal.  He was seen by Dr. Susa Griffins shortly  thereafter because of his cardiac disease who noted left ventricular  dysfunction and an ejection fraction of 20%.  After some workup, Dr.  Alanda Amass cleared him for surgical intervention.   I saw the patient on May 25, 2007.  I found him to be a very pleasant,  but large man. On exam, he had a large tumor that I could palpate with  my finger on rectal exam. This was quite firm but moved slightly.  There  was no other abnormality noted.   The patient was referred to Dr. Mancel Bale.  He underwent neoadjuvant  chemotherapy and radiation therapy preoperatively and was restaged and  the tumor appeared to have gotten somewhat smaller and there was no sign  of any metastatic disease.  He was scheduled  for surgery.  He underwent  a 2-day bowel prep at home.  He was marked for a colostomy since I did  not feel that I could do a low anterior resection given his large body  habitus and the very low nature of the tumor and he was accepting of  that.   HOSPITAL COURSE:  On the day of admission, he underwent abdominal and  perineal resection with total mesorectal excision. The surgery went  smoothly.  The pathology report is not on the chart, but in my progress  note I state that this is a stage T3, N2 tumor with 4/16 lymph nodes  positive for metastatic cancer.   Postoperatively, the patient did fairly well.  He was followed carefully  by Dr. Kandis Cocking group as well as by the wound and ostomy nurse.  He  was also seen by Dr. Mancel Bale in the hospital who discussed his  pathology with him and stated that once he recovered from surgery he  would probably recommend adjuvant FOLFOX chemotherapy to  start about 4  weeks postop and would arrange for outpatient follow-up.   The patient's diabetes was managed quite well with sliding scale  insulin.  He advanced in his diet and activities.  The colostomy began  working and he resumed a normal diet.  He was ready to go home on  October 15, 2007. At that time, he was passing lots of flatus, did have  a little bit of stool, was feeling fine and ready to go home.  The stoma  was healthy, lab work looked fine.   Arrangements were made for him to followup with me in my office, Dr.  Mancel Bale in his office, and Dr. Alanda Amass in his office.      Angelia Mould. Derrell Lolling, M.D.  Electronically Signed     HMI/MEDQ  D:  12/05/2007  T:  12/05/2007  Job:  [id number]   cc:   R. Roetta Sessions, M.D.  [name]. [name], MD  Leighton Roach Truett Perna, M.D.  Richard A. Alanda Amass, M.D.

## 2011-04-20 NOTE — Consult Note (Signed)
NAMEJERMARI, [name]                ACCOUNT NO.:  [id number]   MEDICAL RECORD NO.:  [id number]          PATIENT TYPE:  INP   LOCATION:  3732                         FACILITY:  [facility]   PHYSICIAN:  Reather Littler, M.D.       DATE OF BIRTH:  09-03-1956   DATE OF CONSULTATION:  DATE OF DISCHARGE:                                 CONSULTATION   REASON FOR CONSULTATION:  Diabetes management.   HISTORY:  This patient is a 55 year old who was told about 3 years ago  to have increased blood sugar of about 150.  He was initially not  treated with medications but was sent to a dietician; however, the  patient was somewhat noncompliant with the diet.  Subsequently, the  patient's blood sugar was gradually higher, and he was started on Actos  and the dose was progressively increased.  The patient says the blood  sugars have not been under control at all and last few months have been  mostly over 200.  He says that he has not been motivated to watch his  diet and would eat just about anything.  He also did not have any  exercise regimen.   He thinks for about a month or so he has been placed on Glucophage and  was given a total dose of 2000 mg a day; however, his blood sugars did  not seem to improve and on admission his blood sugars were still over  200.  The patient says that he has been on Actos 45 mg without any side  effects.  He does monitor his blood sugar periodically at home and as  mentioned above it is usually in the low 200s in the morning and  somewhat higher after meals.   PAST MEDICAL HISTORY:  He had an MI in the year 2001.  He has also had  history of serious ventricular arrhythmias and pacemaker and  defibrillator implantations as well as stenting including on this  admission when he had PTCA and stenting of the mid-LAD.  The patient  also has had rectal and prostate carcinoma treated with surgery,  colostomy, radiation, chemotherapy, and he is apparently now on Lupron   injections.   ALLERGIES:  None.   MEDICATIONS:  Prior to admission, the patient was on:  1. Actos 45 mg.  2. Metformin.  3. Spironolactone.  4. Lipitor 10 mg.  5. Lotensin 20 mg b.i.d.  6. Furosemide 20 mg daily.  7. Aspirin.  8. Niaspan 1000 mg daily.  9. Digoxin.  10.Metoprolol 100 mg b.i.d.  11.Fish oil.   Currently, the patient is also on Plavix, and he has been started on  Lantus and sliding scale NovoLog along with holding his metformin  because of his contrast study.   FAMILY HISTORY:  Positive for diabetes in his sister and also positive  for coronary artery disease in his father.   PERSONAL HISTORY:  He has never been a smoker.  He does not abuse  alcohol.  He is disabled.   REVIEW OF SYSTEMS:  He has had hypertension, cardiac arrhythmias, and  coronary  artery disease as above.  He thinks he has been told to have  CHF and as above he has had colostomy for his rectal cancer.  He says he  saw his eye doctor about 2 years ago and was not told to have any  diabetes related problems.  He does have some tingling in his toes, but  he thinks this started after his chemotherapy.  He has not had any  thyroid problems and his TSH on admission was 2.5.  He is also currently  on Coumadin for atrial fibrillation.   PHYSICAL EXAMINATION:  GENERAL:  The patient is mildly obese.  He is  pleasant and cooperative.  VITAL SIGNS:  His pulse is 72 and regular, his blood pressure is 106/65,  temperature normal, does not have any pallor.  EYES:  Externally normal.  Fundi not examined.  Mucous membranes are  normal.  NECK:  There is no thyroid enlargement or lymphadenopathy.  No carotid  bruits.  HEART:  Sounds are normal.  LUNGS:  Clear.  ABDOMEN:  No mass or tenderness.  He does have a colostomy in place.  EXTREMITIES:  He has normal pedal pulses and normal touch sensation.  His ankle jerks are absent.   ASSESSMENT:  The patient has poorly-controlled diabetes with an A1c of  9%.   He has not been compliant with his diet, also has not responded  well to Actos and metformin.  He probably has some insulin deficiency  and currently also has glucose toxicity because of his persistent high  sugars.  He has been started on Lantus insulin, which was increased to  20 units last night and fasting glucose today was 175; however, his  daytime blood sugars have been over 200 despite sliding scale insulin.  The patient has been seen yesterday by the diabetes educator in the  hospital.  Currently, does not have any significant diabetes  complications; however, he has had significant amount of coronary artery  disease.  It is unclear from his chart what his ejection fraction is  currently, although there is mention of severe LV dysfunction in 2002,  surprisingly has been tolerating the Actos quite well.   RECOMMENDATIONS:  Since he is already on insulin and has been instructed  on doing so, we will continue this but also add mealtime NovoLog.  Currently, the patient will be started on 6 units NovoLog at each meal  and this can be adjusted as needed.  If his blood sugars are responding  very well, he may be able to get off of the mealtime NovoLog and  possibly add Januvia to his metformin.  He has been more motivated now  to follow his diet and hopefully will get better control at home with  diet and exercise regimen and some weight loss.  Thank you for the  consultation.  We will follow.      Reather Littler, M.D.  Electronically Signed     AK/MEDQ  D:  10/01/2008  T:  10/01/2008  Job:  [id number]   cc:   Gerlene Burdock A. Alanda Amass, M.D.

## 2011-04-20 NOTE — Cardiovascular Report (Signed)
NAMEJADAN, HINOJOS NO.:  [id number]   MEDICAL RECORD NO.:  [id number]          PATIENT TYPE:  INP   LOCATION:  2024                         FACILITY:  [facility]   PHYSICIAN:  Madaline Savage, M.D.DATE OF BIRTH:  04-12-1956   DATE OF PROCEDURE:  12/03/2008  DATE OF DISCHARGE:                            CARDIAC CATHETERIZATION   PROCEDURES PERFORMED:  1. Selective coronary angiography by Judkins technique.  2. Retrograde left heart catheterization.  3. Left ventricular angiography.  4. Abdominal aortography of the renal arteries, distal aorta, and      iliac runoff.   INTERVENTIONS:  None.   COMPLICATIONS:  None.   PATIENT PROFILE:  The patient is a 55 year old gentleman who entered  [facility] on or around this Christmas Day on November 29, 2008, with unstable angina.  He has a previous history of coronary  artery disease and ischemic cardiomyopathy and is treated with aspirin,  Plavix, Imdur, and Coumadin among others.  He is also diabetic.  He is  on amiodarone for ventricular arrhythmias.  In previous discussions with  Dr. Alanda Amass, the patient has been considered for possible cardiac  transplant and I am not sure where that issue stands at this point.  Today, the patient presents to the [facility] for diagnostic  cardiac catheterization in the view of his unstable coronary syndrome  since Christmas Day of this year.  The patient's cardiac enzymes have  been negative for myocardial infarction.  His EKG has shown  electronically paced rhythm at a rate of 65 a minute with AV sequential  pacing.  Today's procedure was performed uneventfully without any  complications.   RESULTS:  Pressures:  The left ventricular pressure was 130/23 with end-  diastolic pressure of 35.  The central aortic pressure was 130/75 with a  mean of 95.  No aortic valve gradient by pullback technique.   ANGIOGRAPHIC RESULTS:  The left main coronary artery is  relatively  medium in size and does not contain any significant stenoses.  The left  anterior descending coronary artery is diffusely diseased and calcified  mildly in its proximal portion.  There is a large septal perforator  branch that trifurcates that has an ostial stenosis of about 70%.  There  is a radiopaque stent in the mid LAD just beyond septal perforator  branch that appears to be patent.  There is some mild in-stent  restenosis of about 40-50% in the distal portion of the mid LAD stent.  The distal vessel has lumpy-bumpy irregularities and areas of 50%  narrowing, but nothing of high-grade nature.  There are 2 diagonal  branches arising from the midportion of the LAD which were small and did  not appear to be significantly diseased.   There is an intermediate ramus branch which is 1.75-2.25 mm in diameter  and courses to the anterolateral wall of the heart.  This vessel is  severely stenosed 99% as its worse area of narrowing.  There is  calcification present and historically it should be known that this  vessel was the site of a  previous attempt at rotational atherectomy  performed by Dr. Yates Decamp about 2 years ago that was unsuccessful.   The circumflex itself is basically a dominant vessel containing no  significant high-grade stenoses.  It gives rise to a bifurcating  posterolateral branch distally and there is 1 obtuse marginal branch  that is small and severe and diseased proximally to the tune of about  95% narrowed.  It is not a vessel that is amenable to intervention.   Right coronary arteriography shows that the RCA is nondominant.  It is  diseased beyond the acute marginal branch about 70-80%, but there is not  much runoff.   The left ventricular angiogram shows moderate diffuse dilatation.  It  also shows an ejection fraction estimated at 10-15% with severely  hypokinetic wall motion.  I do not see any mitral regurgitation.   Abdominal aortography was  performed showing the renal arteries to be  single and normal bilaterally.  The infrarenal aorta is smooth and  noncalcified and normal in appearance and the common iliacs are also  widely patent.   FINAL IMPRESSIONS:  1. Dilated cardiomyopathy, ischemic type.  2. Left anterior descending stent, midportion of the vessel patent      with no more than 30-40% area of in-stent restenosis.  3. Left anterior descending lesions of about 40-50% mid and distal.  4. Patent circumflex.  5. Patent nondominant right coronary artery.  6. Patent renal arteries.  7. Patent infrarenal abdominal aorta.  8. Patent common iliac arteries.   DISCUSSION:  This patient presented with unstable coronary syndrome 4-5  days ago and is still having pain on intravenous medication.  He is not  on Ranexa at this point.  The culprit vessel which appears to be causing  his current anginal syndrome is a small vessel proximally and is the  intermediate ramus branch.  It has previously been shown to be  refractory at attempts at rotational atherectomy.  It would appear at  this point Ranexa is an option and if there is any place in the  surrounding area where the EECP may be rendered that would be another  thought in terms of managing his angina.           ______________________________  Madaline Savage, M.D.     WHG/MEDQ  D:  12/03/2008  T:  12/03/2008  Job:  [id number]   cc:   Gerlene Burdock A. Alanda Amass, M.D.  [facility]

## 2011-04-20 NOTE — Cardiovascular Report (Signed)
[name], [name]                ACCOUNT NO.:  [id number]   MEDICAL RECORD NO.:  [id number]          PATIENT TYPE:  INP   LOCATION:  2906                         FACILITY:  [facility]   PHYSICIAN:  Cristy Hilts. Jacinto Halim, MD       DATE OF BIRTH:  Aug 01, 1956   DATE OF PROCEDURE:  DATE OF DISCHARGE:                            CARDIAC CATHETERIZATION   PROCEDURE PERFORMED:  1. Left ventriculography.  2. Selective right and left coronary angiography.  3. PTCA and balloon angioplasty of the ramus intermediate branch of      the left coronary artery.  4. PTCA and stenting of the mid-LAD.   INDICATION:  [name] is a 55 year old gentleman with morbid  obesity, hypertension, diabetes uncontrolled, hyperlipidemia who has  ischemic cardiomyopathy with prior anterior wall myocardial infarction.  He has had PTCA and stenting to his mid-LAD in 2001.  Last cardiac  catheterization was in 2002 and this had revealed at about 30% in-stent  restenosis, diffuse 70-80% stenosis of the ramus intermediate, diffuse  60% stenosis of the obtuse marginal one and occluded distal RCA.   He is doing well until he has been noticing increase in exertional  angina and increasing dyspnea on exertion over the last several months,  especially last several weeks.  Early this morning around 1 o'clock, he  had heaviness in his chest around 1:30-2 o'clock, he had heaviness in  his chest associated with ICD discharge.  He has multiple ICD discharges  and EMS has activated.  He was brought to the [facility] Emergency Room.  His EKG demonstrated wide complex rhythm.  He has old inferior wall and  anterior wall Q waves.  The STs appeared to be little more slightly  prominent, however, could not completely establish presence of a STEMI.  Although, he was completely asymptomatic and hemodynamically stable,  because of recurrent ICD discharge, he was brought on a urgent basis to  the cardiac catheterization lab to evaluate his  coronary anatomy.   HEMODYNAMIC DATA:  The left ventricular pressure was 100/11 with end  diastolic pressure of 50 mmHg with end-diastolic pressure was 101/60  with a mean of 75 mmHg.  There was no pressure gradient across the  aortic valve.   ANGIOGRAPHIC DATA:  Left ventricular systolic function was markedly  depressed with ejection fraction of 20-25%.  There was mid to distal  anterior, anterolateral, apical and mid-to-distal inferior and  inferoapical akinesis.   Right coronary artery:  The coronary artery appears to be codominant  with circumflex coronary artery.  It is occluded in the distal segment.  It had diffuse 60-80% stenosis in the mid segments.  Distally, the RCA  has faint collaterals from the left system.   Left main coronary artery:  Left main coronary arteryis large caliber  vessel.  It is smooth and normal. .   Circumflex coronary artery:  Circumflex coronary artery is codominant  with right coronary artery.  It gives origin to a fairly large but small  caliber obtuse marginal one which has a long segment 90% stenosis.  Distally, it gives origin to  PDA branches.  It has got mild luminal  irregularity.   Intermediate ramus:  Intermediate ramus is a very large caliber, very  large vessel measures about 2.5 mm in diameter.  There is a long segment  80-90% calcific stenosis.  This is a at least about 45 mm.   LAD:  LAD is a large caliber vessel in the proximal segment.  The  previously placed stent is widely patent.  There is again mild  calcification is noted.  However, just at the inflow of the stent, there  is a 90% stenosis.  There is mild diffuse luminal irregularity in the  LAD with a 60% stenosis in the mid segment.   INTERVENTION DATA:  Unsuccessful attempt at deployment of a stent into  the ramus intermediate branch.  Multiple balloon inflations were  performed at high pressure including a 2.0 x 20-mm Voyager and 2.5 x 25-  mm Voyager, and a 2.75 x 20-mm  Voyager [place] at high pressures.  In spite  of this, I was unable to place a 2.5 x 30-mm Endeavor over a 2.5 x 18-mm  Vision stent.  Double wiring was also performed.  In spite of this, I  was unable to deliver the device.  Hence, the procedure was aborted.   Successful PTCA and stenting of the mid-LAD implantation of a 2.5 x 18-  mm Vision stent deployed at 6 atmospheric pressure.  Overall, stenosis  was reduced from 90% to 0% with brisk TIMI III to TIMI III flow  maintained at the end of the procedure.   POSTPROCEDURE:  Coronary angiography was performed and we waited for  approximately 3-4 minutes to reevaluate the ramus intermediate stenosis  after the wires were pulled back.  There is a persistence of TIMI III  flow.  There is no haziness or dissection evident.  The lesion is  heavily calcified, remains stable.  I was unable to break the lesion in  spite of high blood pressure balloon inflations.   RECOMMENDATIONS:  The patient will continue on Integrilin for 18-24  hours.  I will review the angiograms with my colleagues.  We can  potentially opt to medical therapy only for ramus intermediate disease  for now and bring him back electively for the potential rotational  atherectomy followed by stenting.  I do not think that this lesion is  amenable for balloon angioplasty and direct stenting.  There is no  compliance in this vessel.   A total of approximately 260 mL of contrast was utilized for diagnostic  and interventional procedure.   TECHNIQUE AND PROCEDURE:  Under usual sterile precautions using a 6-  French right femoral arterial access, 6-French multipurpose B2 catheter  was advanced in the ascending artery to the left ventricle.  Left  ventriculography was performed in RAO projection.  Catheter pulled into  the site of right coronary selectively engaged angiography was  performed, then left main coronary artery selectively engaged  angiography was performed.   TECHNIQUE OF  INTERVENTION:  Using heparin and Integrilin for  anticoagulation, a 7-French guide catheter was utilized to engage in the  left main coronary.  Using asahi pro water guidewire, I  was able to  easily cross through the ramus intermediate branch.  Multiple balloon  inflations as dictated above with 2.0 x 20, 2.5 x 25, a 2.75 x 20-mm  Voyager [place], balloon was performed at high pressures.  In spite of this,  I was unable to deploy and cross the stent at  the heavily calcified  lesion site.  Hence the procedure was abandoned and the attention was  directed towards the LAD.  An Clinical cytogeneticist was advanced into the LAD  and after inflating with a 2.75 x 20-mm Voyager [place] at the in-stent  restenotic segment.  This was followed by a 2.5 x 18-mm Vision at 6  atmospheric pressure.  After this, angiography revealed excellent  results.  The wire was withdrawn angiography.  Guide catheter disengaged  and pulled out of body.  The patient tolerated the procedure well.  No  immediate complication noted.      Cristy Hilts. Jacinto Halim, MD  Electronically Signed    JRG/MEDQ  D:  09/27/2008  T:  09/27/2008  Job:  [id number]

## 2011-04-20 NOTE — Discharge Summary (Signed)
[name], [name]                ACCOUNT NO.:  [id number]   MEDICAL RECORD NO.:  [id number]          PATIENT TYPE:  INP   LOCATION:  4707                         FACILITY:  [facility]   PHYSICIAN:  [name], M.D.DATE OF BIRTH:  [birth year]   DATE OF ADMISSION:  01/08/2009  DATE OF DISCHARGE:  01/09/2009                               DISCHARGE SUMMARY   DISCHARGE DIAGNOSES:  1. Nausea and vomiting.  2. Dizziness and presyncope.  3. Known coronary artery disease with history of a bare-metal left      anterior descending coronary artery stent on September 27, 2008,      recant October 03, 2008, he had 50% in-stent restenosis in tandem      and left anterior descending coronary artery lesions, beyond his      stent 70-80%, also prior history of anterior wall myocardial      infarction.  4. Biventricular implantable cardioverter-defibrillator on September 27, 2008, which was an upgrade from an implantable cardioverter-      defibrillator that had been placed in 2006 for an implantable      cardioverter-defibrillator.  5. Severe left ventricular dysfunction.  6. History of congestive heart failure, [place] Heart Association      class III.  7. History of right bundle-branch block.  8. History of paroxysmal atrial fibrillation.  9. History of cholecystectomy secondary to rectal cancer.  10.History of prostate cancer.  11.Diabetes mellitus.  12.Hypertension.  13.Hyperlipidemia.   LABORATORY DATA:  CK-MBs and troponins were all negative.  TSH was  2.070.  Lipid profile showed total cholesterol of 108, triglycerides  145, HDL of 24 and LDL was 55, lipase was 24, INR was 2.2, amylase was  46, D-dimer was 0.31.  BNP was 100.  Urine was negative for any  pathology or infection.  Hemoglobin 12.8, hematocrit 36.8, WBC 6.3, and  platelets 144.   Medications on discharge are the same as prior to admission:  1. Aspirin 81 mg a day.  2. Benazepril 5 mg a day.  3. Lanoxin 125 mcg per  day.  4. Fish oil 1000 mg 2 every day.  5. Lasix 20 mg every day.  6. Lipitor 40 mg at bedtime.  7. Metoprolol tartrate 100 mg b.i.d.  8. Nitroglycerin p.r.n.  9. Spironolactone 25 mg every day.  10.Vitamin C.  11.Vitamin E daily.  12.Metformin 500 mg b.i.d.  13.Imdur 30 mg every day.  14.Coumadin as directed which is 2.5 mg daily except 1.25 mg on      Thursdays.  15.Co Q10 daily.  16.Amiodarone 200 mg b.i.d.  17.Plavix 75 mg every day.  18.Lantus 24 units at bedtime.  19.NovoLog insulin 14 units t.i.d.  20.Niaspan 1000 mg at bedtime.  21.__________ 40 mg a day.   HOSPITAL COURSE:  [name] activated EMS on January 08, 2009,  secondary to an episode that first started with a feeling of nausea.  He  ate lunch.  He felt better.  He lied down on the floor with his  grandson.  He had a cold sensation across  his chest.  It was not angina.  He stood up to get a bottle for his grandson.  He had dizziness and near  syncope.  He activated the EMS.  He vomited x1 and then felt better.  He  came to the emergency room.  His labs all looked within normal limits.  His blood pressure within normal limits.  He was kept overnight for  observation.  He had no further episodes.  The following day, he  underwent interrogation of his pacemaker with prior medical history of  PAF to rule out that he did not have an SVT.  His interrogation showed  that he had no arrhythmias.  He had no mode switches; however, when she  was interrogating his pacemaker, he did mode switch with an atrial  tachycardia, which only lasted 15 seconds.  He had not had any of this  prior.  The patient did notice that he felt a few palpitations.  To  note, his labs were all negative.  He has had no recent angina at home.  He was seen by Dr. Elsie [name].  It was decided that he was stable for  discharge home on his same medications.  Also to note, he has been to  see an ENT doctor for his dizziness, which he has had for several  weeks  now.  He was given meclizine by his ENT doctor.      [name], N.P.    ______________________________  [name], M.D.    BB/MEDQ  D:  01/09/2009  T:  01/10/2009  Job:  [id number]   cc:   [name]. [name], MD  Dani Gobble, MD  Duke Salvia, MD, [facility]

## 2011-04-20 NOTE — Discharge Summary (Signed)
[name], [name]                ACCOUNT NO.:  [id number]   MEDICAL RECORD NO.:  [id number]          PATIENT TYPE:  INP   LOCATION:  6529                         FACILITY:  [facility]   PHYSICIAN:  Richard A. Alanda Amass, M.D.DATE OF BIRTH:  04-26-1956   DATE OF ADMISSION:  10/21/2008  DATE OF DISCHARGE:  10/22/2008                               DISCHARGE SUMMARY   DISCHARGE DIAGNOSES:  1. Chest pain, not felt to be true angina by Dr. Delrae Rend.  2. Coronary artery disease with recent and ST elevation myocardial      infarction on September 30, 2008, at which time he underwent cardiac      catheterization and he had 2 nondrug-eluting stents placed to his      left anterior descending.  He had progression of disease above      another left anterior descending stent.  He also had progression of      disease in his circumflex with attempts at dilatation which were      unsuccessful.  He does have residual 80% narrowing, being treated      medically.  3. Ischemic cardiomyopathy.  An ejection fraction of 20% by cardiac      catheterization.  4. Recent upgrade of his implantable cardioverter defibrillator to a      biventricular pacemaker by Dr. Graciela Husbands with new A-lead and new left      ventricular lead placed.  He had a Promote RF [id number] implantable      cardioverter defibrillator, model [id number] inserted.  5. Insulin dependent diabetes mellitus.  6. Hypertension.  7. Hyperlipidemia.  8. History of rectal cancer with a permanent colostomy.  9. History of prostate cancer.  10.Gastroesophageal reflux disease, on Pepcid now.  11.Recurrent atrial fibrillation.  12.Recurrent defibrillator shocks with admission for an ST elevation      myocardial infarction at which time he was started on amiodarone.      He has been seen in followup by Dr. Alanda Amass multiple times since      his discharge.  He apparently had recurrent paroxysmal atrial      fibrillation.  His amiodarone was increased for a short  period of      time and also his metoprolol has been uptitrated.  13.Anxiety.  14.Anticoagulation, currently therapeutic for paroxysmal atrial      fibrillation.   LABORATORY DATA:  Point-of-care markers x2 were negative and CK-MB x1  hours later was negative.  Hemoglobin 12.4, hematocrit 36.5, WBCs 5.6,  and platelets 124.  INR was 2.3, sodium 135, potassium 4.9, chloride  103, CO2 23, glucose 152, BUN 26, and creatinine 1.13.  Chest x-ray  showed no acute cardiopulmonary abnormalities, stable left cardiac ICD  device, and transvenous leads.   DISCHARGE MEDICATIONS:  1. Niaspan 1000 mg at bedtime.  2. Metformin 500 mg 2 times a day.  3. Metoprolol 100 mg twice a day.  4. Spirolactone 25 mg a day.  5. Benazepril 10 mg a day.  6. Amiodarone 200 mg twice per day.  7. Coumadin 2.5 mg at 5 o'clock every day or as directed  per Coumadin      Clinic.  8. Furosemide 20 mg daily.  9. Digoxin 125 mcg a day.  10.Lipitor 40 mg a day.  11.Plavix 75 mg a day.  12.Famotidine 40 mg a day.  13.Lantus 24 units at bedtime.  14.NovoLog 14 units before breakfast, 12 units before lunch, and 12      units at supper.  15.Vitamin C 500 mg a day.  16.Fish oil 1000 mg twice per day.  17.CoQ10 50 mg a day.   DISCHARGE INSTRUCTIONS:  He was told to stop his aspirin and to continue  his Plavix and Coumadin for now and to continue his other meds as per  home medications.  He also has an appointment already with Dr. Alanda Amass  who has been following closely as an outpatient.  He should keep that  appointment.   HOSPITAL COURSE:  Mr. Yellowhair was admitted on October 21, 2008 to the  emergency room.  He came in because of chest pain.  It was different  chest pain from his previous angina pain.  It was located across his  chest and then he had left arm tingling and left arm and left side  numbness, tingling, and little discomfort.  It is apparently the second  occurrence since his hospitalization on September 30, 2008.  Review of our  office note showed that Dr. Alanda Amass was aware of this discomfort.  He  titrated up his medications and gave him some Pepcid to take.  He was at  home and experienced this discomfort and did not try any nitroglycerin.  He just came to the emergency room because he was concerned.  Admission  labs looked well.  His initial CK-MBs and troponins by point-of-care  markers were all negative.  On the morning of October 22, 2008, he was  seen by Dr. Jacinto Halim who knew him well.  He did his last procedure.  Dr.  Jacinto Halim recommended that he would consider a high-risk rotational  atherectomy only if he had recurrent CHF, shortness of breath, or  angina.  He did not feel his discomfort at this time is an atrial  angina, though we have suggested that he continue to try to use his  nitroglycerin for chest discomfort.  He should continue his PPI and it  was decided that we should take him off aspirin while he is on his  Plavix.  On the morning of October 22, 2008, his blood pressure was on  the low side 101/50, 96/36.  He was asymptomatic, up and walking in the  halls.  He had no further chest pain and it was decided that after he  ate his lunch if he felt okay that he could be discharged home to follow  up as an outpatient with Dr. Susa Griffins.      Lezlie Octave, N.P.      Richard A. Alanda Amass, M.D.  Electronically Signed    BB/MEDQ  D:  10/22/2008  T:  10/22/2008  Job:  [id number]   cc:   [name]. [name], MD  Duke Salvia, MD, [facility]  Reather Littler, M.D.

## 2011-04-20 NOTE — Op Note (Signed)
[name], [name]                ACCOUNT NO.:  [id number]   MEDICAL RECORD NO.:  [id number]          PATIENT TYPE:  INP   LOCATION:  2550                         FACILITY:  [facility]   PHYSICIAN:  Angelia Mould. Derrell Lolling, M.D.DATE OF BIRTH:  26-Feb-1956   DATE OF PROCEDURE:  10/04/2007  DATE OF DISCHARGE:                               OPERATIVE REPORT   PREOPERATIVE DIAGNOSIS:  Carcinoma of the distal rectum.   POSTOPERATIVE DIAGNOSIS:  Carcinoma of the distal rectum.   OPERATION PERFORMED:  Abdominal and perineal resection of rectum with  total mesorectal excision.   SURGEON:  Dr. Claud Kelp.   FIRST ASSISTANT:  Dr. Leonie Man   OPERATIVE INDICATIONS:  This is a 55 year old male with ischemic  cardiomyopathy, implanted pacemaker and implanted defibrillator,  diabetes and hypertension.  He noticed some rectal bleeding and was  worked up by Dr. Roetta Sessions.  Colonoscopy was performed on May 17, 2007 and he found a large fungating apple core type neoplastic process  beginning at 5 cm from anal verge and corkscrewing up for another 5 cm.  Biopsy showed adenocarcinoma.  He is been evaluated by Dr. Susa Griffins and has been stabilized and cleared for surgery from a cardiac  standpoint.  He underwent CT scanning which showed the rectal mass with  preservation of the tissue planes.  I did a rigid proctoscopy on him and  the tumor was up inside just 4 cm from the dentate line.  I was able to  easily feel this with my finger.  It was bulky but mobile.  No signs of  metastatic disease.  He underwent neoadjuvant chemotherapy and  neoadjuvant radiation therapy and the tumor did get smaller but was  still present for the full extent length of the rectum.  I advised him  that I felt that he would need an abdominal and perineal resection.  He  has been found to have prostate cancer and will need treatment for that  with radiation seeds.  I offered him a consult at the [facility] to  see  if there is any chance he could have a sphincter sparing procedure and  he declined.  He is brought to operating room electively following a 2-  day bowel prep.   OPERATIVE TECHNIQUE:  The patient underwent deprogramming of his  internal defibrillator in the holding area and was taken to the  operating room.  General anesthesia was induced.  He was placed in a  lithotomy position with movable rigid stirrups.  Foley catheter was  inserted without difficulty.  A pursestring suture was placed around the  anus with a silk suture.  The abdomen and genitalia and perineum were  then prepped and draped in a sterile fashion.  Intravenous antibiotics  were given prior to the incision.  The patient was identified as to  correct patient and correct procedure.  A lower midline incision was  made.  The abdomen was entered and explored.  There was no palpable mass  in the liver.  There was no mesenteric adenopathy.  The omentum felt  normal.  There  was no ascites.  I could only feel the small rectal mass  once we had dissected deep into the pelvis.  It was well below the  peritoneal reflection.   Self-retaining retractors were placed.  I incised the peritoneum on the  right and left of the rectum and with traction identified the dissection  plane in the presacral space.  This was mobilized somewhat with blunt  dissection and the use of a LigaSure.  I incised the mesentery up to the  colon in the mid to distal sigmoid and transected the colon at that  point with a GIA stapling device and dissected the mesentery all the way  back to the origin of the superior hemorrhoidal vessels where they came  off of the inferior mesenteric artery and then clamped and divided the  superior mesenteric vessels.  I ligated these with 2-0 silk ties.  I  then took the dissection down and across the sacral promontory and into  the dissection plane performing a complete mesorectal excision.  We felt  that we preserved  the hypogastric nerves.  We identified both ureters  initially and at multiple times during the case and felt there was no  injury to the ureters.  We continued the incision in the peritoneum down  and the around anteriorly to the rectum.  We used the LigaSure and the  harmonic scalpel to take down the lateral pedicles and that worked quite  well.  We continued dissection posteriorly until we were down to the  levators and could feel the tip of the coccyx.  Laterally we had to do a  lot of work to get all of the lateral attachments down, presumably there  was some scarring from the radiation therapy.  Anteriorly we identified  the seminal vesicles and dissected them anteriorly and the rectum  posteriorly creating an anterior space.  We had it all of this  dissection circumferentially until we felt that we had gotten all the  way down to the pelvic floor.   At that point I went down to the perineum and we lifted the legs up.  We  set up our instruments.  I then made a sagittally oriented elliptical  shaped incision around the anus being sure to take plenty of anoderm on  both sides.  Electrocautery was used to divide the subcutaneous tissue  and superficial muscles.  Self-retaining retractors were placed.  I took  the dissection circumferentially until I could palpate the tip of the  coccyx and then slowly dissected my way into the pelvic cavity with Dr.  Lurene Shadow at the abdominal side palpating to show me the tissue plane.  We  then used the LigaSure to divide the levator ani on both sides up around  until we got up around about the 10 o'clock position on the right and  the 2 o'clock position on the left.  At this point we then passed the  proximal rectum out posteriorly through the perineal opening and then I  was able to use electrocautery and the LigaSure to complete the anterior  dissection under direct vision.  The specimen was then sent to lab for  routine histology.  The pelvis and  perineal wounds were irrigated  copiously with saline.  Hemostasis was excellent.  I placed two 19-  Jamaica Blake drains in the pelvic space and brought one out on the right  and one out the left in the gluteal areas sutured them to the skin and  connected  them to suction bulbs.  The deep levator ani muscle was closed  with interrupted sutures of 2-0 Vicryl.  The superficial tissues were  then closed with interrupted sutures of 2-0 Vicryl.  The skin was closed  with multiple interrupted sutures of 3-0 Vicryl.   We then changed our gowns and gloves.  We went back and irrigated out  the lower abdomen and pelvis.  There was absolutely no bleeding.  We  were able to reperitonealized the pelvic floor by closing the peritoneum  with running suture of 2-0 Vicryl.   We then took a look at the descending colon and sigmoid.  We mobilized  the descending colon by dividing its lateral peritoneal attachments.  A  couple of bands distally were required to mobilize this up but then we  had enough length for a colostomy.   We observed the marked site on the level of left abdominal wall which  had been marked by the ostomy nurse.  This was in the lateral aspect of  the left rectus sheath, but was above the level of the umbilicus.  We  cut out a circular button of skin in this area and then debrided the  subcutaneous fat.  I incised the anterior rectus sheath in a cruciate  fashion.  We simply separated the rectus muscles and divided the  posterior rectus sheath.  I then passed a two fingers all the way up to  the proximal joint to make sure we had an adequate opening.  We checked  for bleeding.  There was none.  We checked the colon and made sure that  we passed it through the colostomy site with no kinking or twisting and  that was able to be done easily.  We checked for bleeding and there was  none.  We brought the omentum down into the upper pelvis.  The midline  fascia was then closed with a  running suture of double-stranded #1 PDS  wound was irrigated with saline.  Skin closed with skin staples.   We then matured the colostomy by amputating the staple line and maturing  it with about 10 interrupted sutures of 3-0 Vicryl.  The blood supply to  the colostomy was excellent and bleeding was brisk and had to be  controlled at three or four points and was quite pink at the end.  I was  able to pass my finger easily through the colostomy below the fascia  into the abdominal area.  Colostomy bag was placed.  Clean bandages were  placed.  Edges were also placed  on the perineal wound.  The patient tolerated the procedure well and was  taken to the recovery room in stable condition.  Estimated blood loss  was about 300 mL.  Complications were none.  Sponge, needle and  instrument counts were correct.      Angelia Mould. Derrell Lolling, M.D.  Electronically Signed     HMI/MEDQ  D:  10/04/2007  T:  10/04/2007  Job:  [id number]   cc:   R. Roetta Sessions, M.D.  [name]. [name], MD  Leighton Roach Truett Perna, M.D.  Richard A. Alanda Amass, M.D.

## 2011-04-20 NOTE — Discharge Summary (Signed)
[name], [name]                ACCOUNT NO.:  [id number]   MEDICAL RECORD NO.:  [id number]          PATIENT TYPE:  INP   LOCATION:  4729                         FACILITY:  [facility]   PHYSICIAN:  Sheliah Mends, MD      DATE OF BIRTH:  1956-07-17   DATE OF ADMISSION:  11/12/2008  DATE OF DISCHARGE:  11/15/2008                               DISCHARGE SUMMARY   DISCHARGE DIAGNOSES:  1. Chest pain, negative myocardial infarction.  2. Coronary artery disease with stents placed in October 2009.  He      does have residual disease.  Imdur was started during this      hospitalization.  3. Ischemic cardiomyopathy, ejection fraction 20%.  4. Biventricular implantable cardioverter-defibrillator, which was      upgraded from implantable cardioverter-defibrillator in October      2009.  5. Paroxysmal atrial fibrillation, on Coumadin.  6. Pacemaker adjustment due to overzealous rate responsive pacing      leading to demand ischemia.  7. Nonsustained ventricular tachycardia, slow stay.  8. Diabetes mellitus, much improved control.   DISCHARGE CONDITION:  Improved.   PROCEDURES:  None.   CONSULTS:  Duke Salvia, MD, [facility]   DISCHARGE MEDICATIONS:  1. Aspirin 81 mg daily.  2. Benazepril 5 mg daily.  3. Lanoxin 125 mcg daily.  4. Fish oil 1000 mg 2 tablets daily.  5. Lasix 20 mg daily.  6. Lipitor 40 mg at bedtime.  7. Metformin 500 mg twice a day.  8. Metoprolol tartrate 100 mg twice a day.  9. Nitroglycerin 0.4 mg spray p.r.n.  10.Spironolactone 25 mg daily.  11.Vitamin C 500 mg daily.  12.Vitamin E, D-alpha 400 international units daily though he does not      take this continually.  13.Imdur 30-mg tablet half a tab daily.  14.Coumadin 2.5 mg daily except on Thursdays 1.25 mg daily.  15.CO Q10 50 mg daily.  16.Amiodarone 200 mg twice a day.  17.Plavix 75 mg daily.  18.Lantus 24 units at bedtime.  19.NovoLog 14 units three times daily before meals.  20.Niaspan 1000 mg at  bedtime.  21.Tussin 40 mg daily.   DISCHARGE INSTRUCTIONS:  1. Low-sodium heart-healthy diabetic diet.  2. No restrictions on activity.  3. Follow up with Dr. Alanda Amass on November 25, 2008, at 9:45 a.m.  4. If he has dizziness __________ Dr. Kandis Cocking office now.   HISTORY OF PRESENT ILLNESS:  A 55 year old white married male with  longstanding history of ischemic cardiomyopathy with EF in October 2009  of 20%.  By cath, it was status post  ICD.  In October 2009, he does  have an MI,  minimal elevations of troponin, cardiac cath was done.  He  had to non-drug-eluting stents placed to his LAD.  He also has residual  80% narrowing in the circumflex artery.  He has total occlusion of the  distal RCA with collaterals.  He had his ICD upgraded to BiV at that  time.  Recurring episodes of AFib and atrial flutter with recent ICD  shocks related to AFib with rapid ventricular response.  He had been  started on increased dose of beta-blocker and amiodarone, but due to  episodes of hypotension, beta-blocker was decreased.  He also has  insulin dependent diabetes mellitus.  In October 2009, his  glycohemoglobin was significantly elevated at 11 or so since then he has  eaten very well and exercised daily.  Other history includes  hypertension, dyslipidemia, rectal cancer, and history of prostate  cancer.  On the day of admission, November 11, 2008, he returned with  episodes of chest pain and flutter in his chest.  This is typical at  times to know if he is truly having pain or if it is just the flutter he  is discussing.  He was seen and underwent pacemaker interrogation.  During the hospitalization, he was also seen by Dr. Graciela Husbands.  He felt  reprogramming the pacemaker would solve the problem.  He felt it was  overzealous rate responsive programming.  He was seen by Dr. Graciela Husbands on  December 15, 2008.  We are adjusting medications per Dr. Odessa Fleming  suggestion and by December 16, 2008, the patient was  stable, ambulating  without problems and INR of 2.3 and was ready for discharge home.  He  was seen by Dr. Alanda Amass and he would follow up as an outpatient.   VITALS AT DISCHARGE:  VITAL SIGNS:  Blood pressure was 99/66, pulse 66,  respirations 20, temperature 97.4, sats 97%.  HEART:  Regular rate and rhythm.  LUNGS:  Clear.  ABDOMEN:  Positive bowel sounds, soft, and nontender.  EXTREMITIES:  Without edema.   LABORATORY DATA:  Hemoglobin 13.1, hematocrit 38.9, platelets 171. WBC  6.7, neutrophils 69, lymphs 17, 110 eos, 4 baso, 1.   INR was 1.8 on admission, PTT of 31 at discharge, INR was 2.3.   Chemistry:  Sodium 142, potassium 3.9, chloride 105, CO2 of 27, glucose  134, and BUN 20, creatinine 1.31.   Glycohemoglobin was 8.  Cardiac enzymes:  CK 47, 35, 32, 36.  MBs, all  negative 1, 2, 1.2, and troponin I negative at 0.01-0.02.  BNP was 51,  total cholesterol 109, triglycerides 139, HDL 26, LDL 55.  DIG less than  0.2.  Chest x-ray on admission unchanged appearance of the ICD.  No  acute cardiopulmonary disease.  No evidence of failure.   Initial EKG was done revealing a paced rhythm and then had episodes of  nonsustained V-tach less than 30 beats at a rate of 150 beats per  minute.   The patient ambulated up and down the halls prior to discharge without  any irregular heartbeat, nor any chest pain.      Darcella Gasman. Annie Paras, N.P.      Sheliah Mends, MD  Electronically Signed    LRI/MEDQ  D:  12/26/2008  T:  12/27/2008  Job:  [id number]   cc:   Gerlene Burdock A. Alanda Amass, M.D.  Duke Salvia, MD, [facility]

## 2011-04-20 NOTE — Consult Note (Signed)
[name], [name]                ACCOUNT NO.:  [id number]   MEDICAL RECORD NO.:  [id number]         PATIENT TYPE:  AMB   LOCATION:                                FACILITY:  [facility]   PHYSICIAN:  R. Roetta Sessions, M.D. DATE OF BIRTH:  Mar 06, 1956   DATE OF CONSULTATION:  05/10/2007  DATE OF DISCHARGE:                                 CONSULTATION   REQUESTING PHYSICIAN:  [name]. [name], M.D.   REASON FOR CONSULTATION:  Rectal bleeding.   HISTORY OF PRESENT ILLNESS:  [name] is a 55 year old Caucasian male  who has had intermittent hematochezia with wiping over the last year.  He feels he may possibly have hemorrhoids, as he feels a piece of tissue  externally with wiping.  He has noted bright red blood in small-to-  moderate amounts in the stool on the toilet tissue with wiping.  He  tells me the bleeding is intermittent.  He does feel a lump on the  outside of his anus.  This usually expands over a period of a couple of  days and then seems to rupture, and this is when he notices the  bleeding.  He has used medicated wipes, which have seemed to help.  He  does complain of proctalgia and rectal pruritus.  He denies any  constipation or diarrhea.  He denies any weight loss.  He denies any  abdominal pain.  He has been on aspirin for seven years now.   PAST MEDICAL HISTORY:  1. Diabetes mellitus.  2. Hypertension.  3. MI in 2001.  4. He has a Facilities manager.   CURRENT MEDICATIONS:  1. Niaspan 1 gm nightly.  2. Lipitor 10 mg nightly.  3. Benazepril 20 mg b.i.d.  4. Spironolactone 25 mg daily.  5. Furosemide 20 mg daily.  6. NitroQuick 0.4 mg p.r.n.  7. Aspirin 81 mg daily.  8. Metoprolol 100 mg b.i.d.  9. Actos 45 mg daily.  10.Metformin/HCL 500 mg daily.  11.Digoxin 125 mcg daily.  12.Fish oil 2 daily.  13.Coenzyme Q10 50 mg daily.  14.Vitamin C 500 mg daily.  15.Vitamin E 400 IU daily.   ALLERGIES:  No known drug allergies.   FAMILY HISTORY:  No known  family history of colorectal carcinoma, liver  or chronic GI problems.   SOCIAL HISTORY:  [name] is married.  He has one grown healthy son.  He has been on disability for severe coronary artery disease.  He denied  any tobacco, alcohol, or drug use.   REVIEW OF SYSTEMS:  See HPI, otherwise negative.  GI:  Denies any  heartburn, indigestion, dysphagia, odynophagia, anorexia, early satiety,  nausea or vomiting.   PHYSICAL EXAMINATION:  VITAL SIGNS:  Weight 244.5 pounds.  Height 71  inches.  Temp 97.5, blood pressure 98/60, pulse 60.  GENERAL:  [name] is an obese Caucasian male in no acute distress.  HEENT:  Sclerae clear, nonicteric.  Conjunctivae pink.  Oropharynx pink  and moist without any lesions.  NECK:  Supple without any mass or thyromegaly.  CHEST:  Heart has a regular rate and rhythm  with a normal S1 and S2.  LUNGS:  Clear to auscultation bilaterally.  ABDOMEN:  Positive bowel sounds x4.  No bruits auscultated.  Soft,  nontender, nondistended without palpable mass or hepatosplenomegaly.  No  rebound tenderness or guarding.  EXTREMITIES:  Without clubbing or edema bilaterally.  RECTAL:  Deferred.   IMPRESSION:  [name] is a 55 year old Caucasian male with  intermittent rectal bleeding, suspected to be due to hemorrhoidal  disease; however, he has never had a colonoscopy.  He is going to need a  colonoscopy to rule out colorectal carcinoma, diverticular bleeding, or  benign anorectal bleeding.   PLAN:  1. Colonoscopy with Dr. Jena Gauss in the near future. I have discussed      this procedure, including risks and benefits, including but not      limited to bleeding, infection, perforation, or drug reaction.  He      agrees with the plan and consent will be obtained.  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  2. He will hold his aspirin for three days prior to the procedure.  3. He is going to take half of his diabetes medications the day prior      to and of the  procedure.   We would like to thank Dr. [name] for allowing [place] to participate in the  care of [name].      Nicholas Lose, N.P.      Jonathon Bellows, M.D.  Electronically Signed    KC/MEDQ  D:  05/11/2007  T:  05/11/2007  Job:  [id number]   cc:   [name]. [name], MD  Fax: [phone]

## 2011-04-20 NOTE — Consult Note (Signed)
NAMEATTILIO, [name]                ACCOUNT NO.:  [id number]   MEDICAL RECORD NO.:  [id number]          PATIENT TYPE:  EMS   LOCATION:  MAJO                         FACILITY:  [facility]   PHYSICIAN:  Sheliah Mends, MD      DATE OF BIRTH:  October 12, 1956   DATE OF CONSULTATION:  DATE OF DISCHARGE:  11/27/2008                                 CONSULTATION   IDENTIFICATION:  A 55 year old gentleman with a history of ischemic  cardiomyopathy.   HISTORY OF PRESENT ILLNESS:  This is a 55 year old gentleman with  longstanding history of ischemic cardiomyopathy with an ejection  fraction of 20%, history of coronary artery disease with status post  myocardial infarction on September 30, 2008.  At that time, [name]  underwent cardiac catheterization and stent placement  to the left  anterior descending artery.  At the same time, progression of coronary  artery disease and previously placed stent was seen in the left  circumflex artery.  Attempts at percutaneous coronary intervention were  unsuccessful and [name] has a residual 80% narrowing in his  circumflex artery.  In addition, he has a total occlusion of the distal  right coronary arteries with left to right collaterals.   Today, [name] presents with episodes of palpitation.  He felt his  heart was beating irregularly and fast when he was exposed to the cold.  He denied any new episodes of chest pain or shortness of breath.  Mr.  [name] has a history of biventricular pacemaker and ICD placement and  there was no history of recent ICD activation.  In fact, an ICD  interrogation performed today showed normal ICD function without  evidence of recent tachyarrhythmias.  [name] comorbidities include  insulin-dependent diabetes mellitus, hypertension, dyslipidemia, history  of rectal cancer, and history of prostate cancer.   At the time of evaluation in emergency department, the patient was  asymptomatic.  He denied any lightheadedness,  dizziness, chest pain,  shortness of breath, palpitation, orthopnea, and lower extremity edema  at the time of evaluation.   PAST MEDICAL HISTORY:  1. Coronary artery disease, status post percutaneous coronary      intervention to the LAD and left circumflex artery with residual      high grade stenosis in the left circumflex artery and occlusion of      the distal right coronary artery.  2. Ischemic cardiomyopathy with an EF of 20% and status post      biventricular pacing device and ICD placement.  3. Status post non-STEMI in October 2009.  4. History of atrial fibrillation with rapid ventricular response.  5. History of ventricular tachycardia.  6. Hypertension.  7. Type 2 diabetes mellitus.  8. Dyslipidemia.  9. Rectal cancer.  10.Prostate cancer.   MEDICATIONS:  1. Aspirin 81 mg p.o. daily.  2. Benazepril 10 mg p.o. daily.  3. Digoxin 0.125 mg p.o. daily.  4. Lasix 20 mg p.o. daily.  5. Lipitor 10 mg p.o. nightly.  6. Metformin 500 mg p.o. daily.  7. Metoprolol titrate 100 p.o. b.i.d.  8. Nitroglycerin 0.4 mg q.4  h. with chest pain x3.  9. Spironolactone 25 mg p.o. daily.  10.Amiodarone 200 mg p.o. b.i.d.  11.Plavix 75 mg p.o. daily.  12.Lantus and NovoLog as directed.   ALLERGIES:  No known drug allergies.   SOCIAL HISTORY:  [name] is married.  He has one son.  He is  disabled.  He has no history of tobacco, alcohol, or IV drug abuse.   FAMILY HISTORY:  There is a strong family history of colon cancer, which  has been diagnosed in the patient's mother and sister, __________ died  at age 28 secondary to coronary artery disease.   REVIEW OF SYSTEMS:  Positive as above.  Otherwise, 12-point review of  system is negative.   PHYSICAL EXAMINATION:  GENERAL:  The patient is alert and orient x2.  VITAL SIGNS:  Blood pressure 123/66, heart rate 65, and respiratory rate  20.  NECK:  Supple.  Normal JVP.  No carotid bruit.  CHEST/LUNGS:  Clear to auscultation  bilaterally.  No rales or wheezes.  HEART:  Regular rate and rhythm.  No rub, murmur, or gallop.  ABDOMEN:  Soft, nontender, and nondistended.  Positive bowel sounds.  EXTREMITIES:  No edema.   Electrolytes, cardiac enzymes were within normal limits.   EKG shows ventricularly paced rhythm, but no evidence of significant  arrhythmia.   IMPRESSION:  1. Palpations.  There was a history of atrial fibrillation and      tachyarrhythmias in the past.  2. Ischemic cardiomyopathy.  3. Coronary artery disease.  4. Type 2 diabetes mellitus.  5. Dyslipidemia.  6. Hypertension.   PLAN:  [name] presents today with some palpation as ICD  interrogation did not show evidence of any new tachyarrhythmias.  At  this time, I feel he can be safely discharged and will be continued on  amiodarone as well as  the Lopressor and digoxin.  [name] will follow up with Dr. Alanda Amass  at the [facility] office and is instructed to call  us if there was any new symptoms in the interim.   Thank you for this interesting consult.      Sheliah Mends, MD  Electronically Signed     JE/MEDQ  D:  11/27/2008  T:  11/28/2008  Job:  [id number]

## 2011-04-20 NOTE — Letter (Signed)
October 28, 2008    [name], M.D.  [phone] [address]  [place], [place] [address]   RE:  Robert, [name]  MRN:  [id number]  /  DOB:  1956-04-12   Dear [name]:   [name] comes in today following CRT upgrade.  He is doing much  better with improved shortness of breath.  He has brief episodes of  atrial fibrillation.   His medications include amiodarone recently initiated at 200 mg twice  daily.   On examination today, his blood pressure was really quite low at 89/55  with a pulse of 64 with some orthostatic change.   The lungs were clear.  The heart sounds were regular.  The extremities  were without edema.  Interrogation of his defibrillator was undertaken.  Rate response was turned on.  __________pressure was turned on as  otherwise left for chronic reprogram in your hands.   The other thing Rich, was that I was impressed by his blood pressure.  We will try and contact him to have cut his benazepril to half and may  be his metoprolol half as well as the amiodarone has done a good job,  controling his atrial fibrillation, both in terms of rate which on  interrogation  is in the 100 to 120 range as well as the frequency with only four  episodes, one of which was 2 minutes.   We will be glad to see him again at your request, otherwise I have  advised him to follow up with you.    Sincerely,      Duke Salvia, MD, [facility]  Electronically Signed    SCK/MedQ  DD: 10/28/2008  DT: 10/29/2008  Job #: [id number]
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
## 2011-04-20 NOTE — Op Note (Signed)
[name], [name]                ACCOUNT NO.:  [id number]   MEDICAL RECORD NO.:  [id number]          PATIENT TYPE:  AMB   LOCATION:  DAY                          FACILITY:  [facility]   PHYSICIAN:  Angelia Mould. Derrell Lolling, M.D.DATE OF BIRTH:  November 14, 1956   DATE OF PROCEDURE:  06/13/2008  DATE OF DISCHARGE:                               OPERATIVE REPORT   PREOPERATIVE DIAGNOSIS:  Rectal cancer.   POSTOPERATIVE DIAGNOSIS:  Rectal cancer.   OPERATION PERFORMED:  Removal of Power Port venous vascular access  device.   SURGEON:  Dr. Claud Kelp.   OPERATIVE INDICATIONS:  This is a 55 year old white man who underwent  neoadjuvant chemotherapy and neoadjuvant radiation therapy, and then  underwent abdominal and perineal resection of the rectum for rectal  cancer on October 04, 2007.  His final pathology report showed that his  rectal carcinoma, stage T3, N2.  He had postoperative adjuvant  chemotherapy.  He has been evaluated by Dr. Mancel Bale, all of his  scans are negative and it appears that he has no evidence of disease at  this time.  Dr. Truett Perna referred him back to me for removal of his  Power [place].   OPERATIVE TECHNIQUE:  The patient was brought to the operating room,  placed supine on the operating table.  He was monitored and sedated by  the anesthesia department.  The patient was identified as correct  patient, correct procedure and correct site.  The right upper chest was  prepped and draped in a sterile fashion.  I could palpate the port in  the right infraclavicular area just deep to the transverse incision that  had previously been used to insert the port.  We used 1% Xylocaine with  epinephrine as a local infiltration anesthetic.  Transverse incision was  made through the previous scar.  Dissection was carried down through  subcutaneous tissue until I identified the power port and entered the  capsule and divided that.  I mobilized the port and divided all of the  adhesions.  I then removed the port and the catheter intact.  There was  almost no bleeding.  I closed the subcutaneous tissue with multiple  interrupted sutures of 3-0 Vicryl and I closed the skin with a running  subcuticular suture of 4-0 Monocryl and Dermabond.  Clean bandages were  placed and the patient taken to recovery room in stable condition.   ESTIMATED BLOOD LOSS:  About 10 mL or less.   COMPLICATIONS:  None.   Sponge, needle and instrument counts were correct.      Angelia Mould. Derrell Lolling, M.D.  Electronically Signed    HMI/MEDQ  D:  06/13/2008  T:  06/13/2008  Job:  [id number]

## 2011-04-20 NOTE — Discharge Summary (Signed)
[name], [name]                ACCOUNT NO.:  [id number]   MEDICAL RECORD NO.:  [id number]          PATIENT TYPE:  INP   LOCATION:  3732                         FACILITY:  [facility]   PHYSICIAN:  Robert Hilts. Jacinto Halim, MD       DATE OF BIRTH:  [birth year]   DATE OF ADMISSION:  09/27/2008  DATE OF DISCHARGE:  10/02/2008                               DISCHARGE SUMMARY   DISCHARGE DIAGNOSES:  1. Acute coronary syndrome with non-ST-elevation myocardial      infarction.      a.     Emergent cardiac catheterization with 90% stenosis of the       proximal left anterior descending just proximal to the previous       stent to the left anterior descending, as well as intermittent       intermediate ramus of 80-90% in several areas.  Percutaneous       transluminal coronary angioplasty and stent deployment to 90% left       anterior descending stenosis, successful, numerous attempts at       intervention to the intermediate ramus without success though       transmural inferior myocardial infarction 3 flow at the end of       procedure.  2. Ventricular tachycardia storm secondary to acute coronary syndrome      with non-ST-elevation myocardial infarction with 5 shocks with      patient's implantable cardioverter-defibrillator.      a.     IV amiodarone and now increased dose of p.o. amiodarone.      b.     Previous discharge of his implantable cardioverter-       defibrillator, September 22, 2008.  3. Ischemic cardiomyopathy.  Ejection fraction per catheterization on      September 27, 2008, was 20%.      a.     The patient has a [employer] Jude implantable cardioverter-       defibrillator.      b.     Now upgrade of the implantable cardioverter-defibrillator to       a biventricular pacemaker.  4. Chronic systolic heart failure and [place] Heart Classification      III.  5. Coronary artery disease with disease as above as well as residual      80% right coronary artery stenosis and 60% stenosis proximal,  as      well as obtuse marginal artery 1 stenosis.  Please note that the      right coronary artery is also 100% occluded distally.  6. Right bundle branch block and left anterior fascicular block.  7. Paroxysmal atrial fibrillation, now with anticoagulation.  8. Diabetes mellitus, 2, poorly controlled, now on insulin as well as      oral agents.  9. Hyperlipidemia.  10.Family history of coronary disease.   DISCHARGE CONDITION:  Improved.   PROCEDURES:  Emergent cardiac catheterization on September 27, 2008, by  Dr. Yates [name].   Percutaneous transluminal coronary angioplasty and stent deployment to  the proximal left anterior descending on September 27, 2008, by  Dr. Jacinto [name].   On September 27, 2008, unsuccessful attempt at angioplasty to the  intermediate ramus 80%-90% stenosis but transmural inferior myocardial  infarction 3 flow at the end of procedure.   On January 01, 2008, upgrade of patient's guidant implantable  cardioverter-defibrillator to a biventricular implantable cardioverter-  defibrillator pacer by Dr. Sherryl [name].   DISCHARGE MEDICATIONS:  1. Aspirin two 81 mg tablets daily.  2. Benazepril 10 mg once daily.  3. Lanoxin 125 mcg daily.  4. Fish oil 1000 mg 2 daily.  5. Lasix 20 mg daily.  6. Lipitor 40 mg daily.  7. Metformin 500 mg twice a day.  8. Metoprolol 50 mg twice a day.  9. Niaspan 1000 mg daily, in the evening.  10.Nitroglycerin 0.4 mg sprays sublingual as needed for chest pain.  11.Spironolactone 25 mg daily.  12.Amiodarone 200 mg 2 tablets tonight and then 2 tablets twice a day      until October 13, 2008, then 200 mg 2 tablets daily (400 mg twice a      day until October 13, 2008, then begin on 400 mg daily).  13.[name] 2.5 mg, October 02, 2008, one pill daily thereafter unless      instructed differently by [name] [name].  14.Prilosec 20 mg 1 daily over the counter.  15.Plavix 75 mg 1 daily, do not stop because stopping could cause  a      heart attack.  16.NovoLog insulin 12 units before breakfast, lunch, and before      supper, 10 minutes prior to the meal.  17.Lantus insulin 22 units at bedtime   DISCHARGE INSTRUCTIONS:  1. Low-sodium heart-healthy diabetic diet.  2. Keep incision dry for 6 days, may shower on October 07, 2008.  3. Do not raise arm over head until Dr. Alanda [name] tells to.  4. Follow up with Dr. Graciela [name] on October 28, 2008, at 3:15 p.m.  5. Follow up with Dr. Alanda [name] on October 11, 2008, at 9:15 a.m. in      [place].  6. Have blood work done on October 03, 2008, and at that time home      health should draw for him, prescription was given.  It was      discussed with the patient, bleeding issues, right groin site      bleeding as well as any swelling or bleeding at the ICD site.   Also orders were written for advanced home health to ensure that he is  stable taking his insulin.  Additionally, we set up referral to [facility].  During the hospitalization, we reviewed films  and given insulin and did do so successfully as well as draw it up prior  to his discharge.   HISTORY OF PRESENT ILLNESS:  A 55 year old cardiology patient of Dr.  Alanda [name], primary care patient of Dr. Sherwood [name], presented to the emergency  room on September 27, 2008, secondary to receiving multiple shocks from  his ICD.  He had been watching TV, began to experience what felt like  indigestion, chest discomfort, lasted several minutes, received a shock  from his ICD.  He did not tell his wife.  He went to bed and then  received 2 more shocks.  At that point, he called EMS.  As per  instructions, took 1 nitro sublingual and pain that he also had abated.  When he was in the EMS, he had runs of V-tach and received 2 more  shocks.  When he arrived at  [facility], he was in sinus rhythm with a  right bundle branch block.  One week prior, there appeared to be less ST  elevation as on September 27, 2008, emergency  admission.  A code STEMI  initially called but cancelled after discussing with Dr. Jacinto [name], placed  on IV nitro, heparin, and IV amiodarone was given followed by amiodarone  drip.  Blood pressure was dropped to the 70 systolic and he was very  weak.  His nitro was discontinued and then he had some nonsustained  ventricular tachycardia.  By this time, he was pain free without any  acute EKG changes.   He was monitored.  Dr. Jacinto [name] came in to further evaluate the patient.  Due to the ongoing symptoms, it was felt emergency cardiac cath was  recommended.  He was then taken emergently to the cath lab from the  emergency room.  There he was found to have EF of 20%, RCA stenosis as  previously described, as well as 90% stenosis to the LAD and 80-90% in  several places of the intermediate ramus.  The OM-1 had also 90%  calcific plaque.  The patient underwent PTCA stent deployment with a  Multilink Mini Vision stent, and the patient tolerated the procedure.  Immediately post procedure, he was taken to the CCU unit for further  monitoring.  It was found his glucose was greater than 300, at that  point, he was put on insulin drip.  The patient's metformin was held  when the patient was having episodic paroxysmal AFib according to the  ICD interrogation.   The patient was managed and actually was stabilized. EP consult was  obtained due to the ventricular tachycardia storm.   PAST MEDICAL HISTORY:  The patient had been admitted to the hospital on  September 22, 2008, secondary to defibrillation by ICD.  He was kept  overnight and was stable and discharged home the next morning.  Other  history, coronary disease as stated with an acute MI in 2000 and stent  to the LAD.  Known occluded diagonal 1.  Circumflex OM-2 had 60%  stenosis in the past and the RCA, the distal occlusion has been present  for some time.   Previously, the patient's ischemic cardiomyopathy, EF had been 30-40% in  February 2009.   He does have ICD Guidant model generator.  History of  nonsustained ventricular tachycardia, dyslipidemia, rectal and prostate  cancer with status post colectomy, history of tobacco use, none  currently, paroxysmal AFib, and diabetes mellitus type 2.  He had been  only treated alone now with insulin.   FAMILY HISTORY, SOCIAL HISTORY, AND REVIEW OF SYSTEMS:  See H&P.   ALLERGIES:  In the past, he has had nosebleeds with PLAVIX.   OUTPATIENT MEDICATIONS:  When we initially did the H&P, there were some  discrepancies what he said at discharge, he had been on metformin 500.  He tells me he is taking 2000 mg twice a day and his Toprol was 100 mg  twice a day and his amiodarone had been increased previously to 200 mg  twice a day.  Actually, he should have been on amiodarone 400 mg twice a  day through September 28, 2008, and then 200 daily, and he was supposed to  have decreased his Lanoxin to 0.125 but that was not done either.   PHYSICAL EXAMINATION AT DISCHARGE:  VITAL SIGNS:  Blood pressure 108/63  and pulse 75.  LUNGS:  Clear.  HEART:  Regular rate  and rhythm.  Pacer site was without hematoma.  EXTREMITIES:  No lower extremity edema.   ADMITTING LABORATORY:  Hemoglobin 13.9, hematocrit 40.3, WBC 8.5,  platelets 196, MCV 94, and neutrophils 74 and prior to discharge, these  remained stable.  Prior to discharge, hemoglobin 13.4, hematocrit 38.5,  WBC 8.2, platelets 198, and MCV 94.5.   CHEMISTRY ON ADMISSION:  Sodium 134, potassium 3.4, chloride 99, CO2 20,  BUN 28, creatinine 1.30, and glucose 272.  These essentially remained  the same.  Potassium was replaced and stable and glucose was up and  down.  At discharge on September 28, 2008, sodium 136, potassium 4.2,  chloride 100, CO2 26, BUN 11, creatinine 1.08, and glucose 145.   Coags on admission, pro time 12.5, INR 0.9, PTT 27.   At discharge, pro time 32.2, INR 2.9.  His [name] was held, that was  on 5 mg daily on most days,  occasionally 7.5.  He probably had 4 doses.  His [name] was held on the evening of October 02, 2008, and he was  given a prescription for 2.5 mg tablets.   LFTs were normal.  AST 22, ALT 20, alkaline phos 89, and total bili 0.8.   Cardiac enzymes minimally elevated.  CK on admission 58, MB 2.9,  troponin I 0.11.  Peak troponin was 0.13, CK-MB was 58 with 2.9 MB, all  were negative.   Total cholesterol 138, LDL 76, HDL 25, and triglycerides 161.  His  Lipitor was increased to 40.   ELECTROLYTES ON ADMISSION:  Further electrolytes, calcium 10-9.3.  Magnesium was 1.6.  TSH on admission 2.473.  Glycohemoglobin was  elevated at 9.  Lanoxin was less than 0.2 and a BNP was 195.   RADIOLOGY:  Initial chest x-ray, cardiomegaly with pacemaker, decreased  right basilar atelectasis.  Followup on October 01, 2008, no  pneumothorax after placement of left-sided ICD, right base atelectasis.   Initial EKG with right bundle branch block with perhaps more ST  elevation but difficult to assess because of the bundle.  The patient  had multiple episodes of ventricular tachycardia requiring shocks.   HOSPITAL COURSE:  The patient was admitted as stated, taken to the cath  lab fairly emergently, was then transferred to the CCU where he had  significant monitoring, IV insulin drip, EP consult was obtained.  The  patient was placed on [name] secondary to paroxysmal AFib.  The  patient was on IV amiodarone secondary to ventricular tachycardia, and  the patient was on insulin drip due to uncontrolled diabetes mellitus on  oral agents.  He continued to be fairly stable.  The discussion began  with left anterior fascicular block, right bundle-branch block in the  setting of EF of 20%, upgrade to biventricular defibrillator would be  maximal therapy for this patient.  He was started on [name] and then  on September 30, 2008, underwent upgrade to [place]. Jude CRT device.  The  patient did well and continued to  improve.  On October 01, 2008, or so,  we had Dr. Lucianne Muss to see the patient, endocrinologist, for improvement of  his diabetes management, now with significant coronary disease.  The  patient had been placed on Lantus prior to pulling him off the insulin  drip and now NovoLog with meal coverage as well as Lantus.  He will  follow up with Dr. Lucianne Muss as an outpatient for his diabetes as well as  Dr. Sherwood [name].   The patient will follow up with  Dr. Alanda [name], Dr. Sherwood [name], Dr. Graciela [name], and  Dr. Lucianne Muss.  If he has questions or problems, he will call us.   We did set up advanced home care just to assist him with his diabetes  medications, as well as his multiple medication changes during this  hospitalization.   Please note, specifically medication changes, aspirin was increased to  162 mg daily, not the 325, we have usually seen, secondary to  therapeutic [name] and Plavix.  His benazepril had been decreased to  10 mg once daily from 20 mg twice a day.  His Lipitor had been increased  from 10 daily to 40 mg daily.  His metformin, he had previously been  discharged on 500 daily, but he was actually on 2000 mg twice a day,  with still poor control.  His metoprolol, he had been on 100 mg twice a  day and he was discharged on 50 mg twice a day.  His amiodarone had been  increased.  [name] had been added as well as the Plavix.  The patient  was also instructed to stop his vitamin E  until Dr. Justus Memory that.  The patient and son were both given  instructions on medications and what to expect.  They both felt  comfortable with the instructions by the time they were discharged.  The  patient will follow up with Dr. Alanda [name], Dr. Graciela [name], Dr. Sherwood [name], and Dr.  Lucianne Muss.      Darcella Gasman. [place], N.P.      Robert Hilts. Jacinto Halim, MD  Electronically Signed    LRI/MEDQ  D:  10/02/2008  T:  10/03/2008  Job:  [id number]   cc:   Gerlene Burdock A. [name], M.D.  Duke Salvia, MD, [facility]  Reather Littler, M.D.  [name]. Robert Gambler, MD

## 2011-04-20 NOTE — H&P (Signed)
[name], [name]                ACCOUNT NO.:  [id number]   MEDICAL RECORD NO.:  [id number]          PATIENT TYPE:  INP   LOCATION:  4729                         FACILITY:  [facility]   PHYSICIAN:  Sheliah Mends, MD      DATE OF BIRTH:  [birth year]   DATE OF ADMISSION:  11/11/2008  DATE OF DISCHARGE:                              HISTORY & PHYSICAL   IDENTIFICATION:  A 55 year old gentleman with history of ischemic  cardiomyopathy.   HISTORY OF PRESENT ILLNESS:  [name] is a 55 year old gentleman who  has a longstanding history of ischemic cardiomyopathy with an ejection  fraction of 20% status post biventricular ICD placement.  [name] had  a recent myocardial infarction on September 30, 2008, at which time, he  underwent cardiac catheterization and had 2 non-drug-eluting stents  placed to his left anterior descending artery.  Progression of coronary  artery disease in a previously placed stent was seen at that time as  well as progression of coronary artery disease in the circumflex stents.  Attempts of dilatation were unsuccessful and [name] has a residual  80% narrowing in his circumflex artery.  In addition, he has total  occlusion of the distal right coronary artery with collaterals.   In addition, [name] has recurrent episodes of atrial fibrillation  and atrial flutter.  He has had recent  ICD shocks related to atrial  fibrillation with rapid ventricular response.  Subsequently, his ICD was  reprogrammed and he was started on an increased dose of beta-blocker and  amiodarone.  However, due to episodes of hypertension, his dose of beta-  blocker was subsequently decreased in Dr. Wilmon Arms office.   [name] comorbidities include insulin-dependent diabetes mellitus,  hypertension, dyslipidemia, rectal cancer, and history of prostate  cancer.   [name] now returns with episodes of chest pain, palpitations, and  flutter in his chest.  He noticed initially his  episodes after  decrease of his dose of Lopressor.  The patient took some nitroglycerin  with some improvement of his pain.   PAST MEDICAL HISTORY:  1. Coronary artery disease, status post percutaneous coronary      intervention to the left anterior descending artery and left      circumflex artery with a residual high-grade stenosis of the left      circumflex artery and occlusion of the distal right coronary      artery.  2. Ischemic cardiomyopathy with EF of 20%.  3. Status post non-STEMI.  4. Atrial fibrillation with rapid ventricular response.  5. History of ventricular tachycardia.  6. Status post biventricular pacemaker and ICD placement.  7. Hypertension.  8. Type 2 diabetes mellitus.  9. Dyslipidemia.  10.Rectal cancer.  11.Prostate cancer.   MEDICATIONS:  1. Aspirin 81 mg p.o. daily.  2. Benazepril 10 mg p.o. daily.  3. Digoxin 0.125 mg p.o. daily.  4. Lasix 20 mg p.o. daily.  5. Lipitor 10 mg at bedtime.  6. Metformin 500 mg p.o. daily.  7. Metoprolol tartrate 100 mg p.o. b.i.d.  8. Nitroglycerin 0.4 mg q.5 minutes.  9. Spironolactone  25 mg p.o. daily.  10.Amiodarone 200 mg p.o. b.i.d.  11.Plavix 75 mg p.o. daily.  12.Lantus 24 units subcutaneous at bedtime.  13.NovoLog 14 units subcutaneous q.a.m.   ALLERGIES:  No known drug allergies.   SOCIAL HISTORY:  [name] is married.  He has 1 son, he is disabled.  He has no history of tobacco, alcohol, and IV drug abuse.   FAMILY HISTORY:  There is a strong family history of colon cancer.  His  mother was diagnosed at age 49, and his sister was diagnosed at age 62.  The patient's father died at age 15 secondary to coronary artery  disease.   PHYSICAL EXAMINATION:  GENERAL:  The patient is alert and oriented x3  and somewhat anxious.  VITAL SIGNS:  Blood pressure 109/75, heart rate 75, temperature 97.5,  and respirations 20.  NECK:  Supple, normal JVP.  No carotid bruits.  LUNGS:  Clear to auscultation  bilaterally.  No rales or wheezes.  HEART:  Regular rate and rhythm.  No rub or gallop.  A 2/6 systolic  murmur at the apex.  ABDOMEN:  Soft, nontender, and nondistended.  Positive bowel sound.  EXTREMITIES:  No edema.   LABORATORY DATA:  CBC, WBC 6.7, hemoglobin 14.1, hematocrit 48.9, and  platelets 171.  INR 1.9.  Chemistry panel within normal limits except  for mildly elevated glucose of 145, troponin 0.02, BNP 51, total  cholesterol 109, triglycerides 139, HDL 26, LDL 55, and digoxin less  than 0.2.   EKG shows a ventricularly paced rhythm.  Telemetry strips show episodes  of nonsustained V tach, less than 30 beats at a rate of approximately  150 beats per minute.   IMPRESSION:  1. Angina in context of 3-vessel coronary artery disease and ischemic      cardiomyopathy with EF of 20%.  2. Episodes of nonsustained ventricular tachycardia.  3. History of atrial fibrillation with rapid ventricular response.  4. Type 2 diabetes mellitus.  5. Dyslipidemia.  6. Hypertension.   PLAN:  [name] presents with a complex cardiac history.  He has  history of end-stage ischemic cardiomyopathy with an EF of 20%.  He is  compensated from a heart failure standpoint.  However, he has frequent  runs of nonsustained V tach as well as complaints of angina.   At this point, I would like to increase his Lopressor dose to 100 mg  q.a.m. and q.p.m. as well as 50 mg at noon.  I hope, the patient will  hemodynamically tolerate this fairly high dose of Lopressor.  In  addition, I will reload him was amiodarone 400 mg p.o. b.i.d. for 5  days.  This should help to suppress and control his episodes of  ventricular tachycardia that are likely to contribute to his symptoms.  In addition, [name] will receive a reloading dose of digoxin.   At this time, there is no evidence of a new coronary event and therefore  coronary angiography is not part of my initial workup.  My hope is that  [name] will  have less symptoms after the adjustment of his medical  therapy.   [name] will be admitted as a full code.   Thank you for allowing me to assist in the care of this nice gentleman.      Sheliah Mends, MD  Electronically Signed     JE/MEDQ  D:  11/12/2008  T:  11/13/2008  Job:  [id number]

## 2011-04-20 NOTE — Op Note (Signed)
NAMEGIANNIS, CORPUZ                ACCOUNT NO.:  [id number]   MEDICAL RECORD NO.:  [id number]           PATIENT TYPE:   LOCATION:                                 FACILITY:   PHYSICIAN:  Duke Salvia, MD, FACCDATE OF BIRTH:  1956/04/25   DATE OF PROCEDURE:  DATE OF DISCHARGE:                               OPERATIVE REPORT   PREOPERATIVE DIAGNOSES:  1. Previously implanted defibrillator.  2. Ischemic cardiomyopathy.  3. Device approaching end of life.  4. Bifascicular block with congestive heart failure.   PROCEDURES:  1. Upper extremity venogram.  2. Explantation of a previously implanted device.  3. Insertion of an LV lead.  4. Insertion of an RV lead.  5. Insertion of a new defibrillator with intraoperative defibrillation      threshold testing and pocket revision.   Following obtaining informed consent, the patient was brought to the  electrophysiology laboratory and placed on the fluoroscopic table in the  supine position.  After routine prep and drape of the left upper chest,  lidocaine was infiltrated along the line of the previous incision and  carried down towards the level of the device pocket using sharp  dissection and electrocautery.  The pocket was not opened.  At this  point, venogram having demonstrated patency of the extrathoracic left  subclavian vein.  Access was obtained without difficulty and without the  aspiration of air or puncture of the artery.  Two separate venipunctures  were accomplished.  Guidewires were placed and retained.   Sequentially, a 9.5- and 7-French sheaths were placed, which were passed  initially.  A Medtronic MB-1 coronary sinus cannulation catheter and to  the latter, a St. Jude 6080 TC active-fixation atrial lead, serial  [id number].   The placement of the LV lead turned out to be quite difficult.  The  coronary sinus was cannulated without difficulty.  It was actually  rather somewhat anterior than I had anticipated.   Venography  demonstrated a narrowing in the mid portion and no veins along the  lateral portion.  There were 2 very posterolateral veins, but in the  patient with right bundle left anterior fascicular block, these were  felt not to be appropriate targets.  There was a high anterolateral  branch.  This was targeted.  We initially were able to pass the Whisper  wire into it without too much difficulty.  I then tried to pass a  Medtronic 4196 lead, which did not pass.  We then removed the lead and  attempted to pass a Medtronic Attain 2 coronary sinus cannulation  subselection system into this vein branch, but could not get the dilator  to pass even past the narrowing at the takeoff of this branch.  We spent  some time doing this.  We then tried now withstanding the patient's  conduction system delay to target the low branches.  That turned out to  be quite difficult also and as they were not particularly good targets,  I did not spent a lot of time there.   We then took another venogram up very high  and identified the anterior  branch as well as a high anterolateral branch, and we elected to try and  pursue the latter.  God was with [place] and we were able to get the wire  into that vein and then pass a 4196 to about 3-4 cm into this vein  branch.  Its distal tip was at the termination of the middle and  proximal thirds.  In this location, the bipolar L-wave was 40 with a  pace impedance of 2269 ohms, a threshold 0.7 V at 0.5 msec.  Current  threshold was 0.3 and there was no diaphragmatic pacing at 10 V.   The 9.5-French sheath was removed.  The MB-2 was left in place while the  atrial lead was placed at the right atrial appendage where the bipolar P-  wave was 2.2 with a pace impedance of 592, threshold 1 V at 0.5 msec.  Current threshold 1.6, and there is no diaphragmatic pacing at 10 V.  This lead was secured to the prepectoral fascia.  The MB-2 sheath was  then split under fluoroscopy  without displacement of the lead.  The LV  lead was secured to the prepectoral fascia and then the device pocket  was opened for the first time.  Because of the cross axes of the 2  defibrillator systems, the pocket had to be extended caudally quite  significantly.  This required first to free up the lead and then to  extend the pocket, which was done without difficulty.  At this point,  the RV lead was interrogated with an amplitude of 13.4 with pace  impedance of 880 ohms, threshold 0.4 V at 0.5 msec.  The current  threshold 0.5 MA.  This lead was a model [id number], serial [id number].  The  pocket was then extended as I mentioned not only caudally, but cephalad.  The pocket was copiously irrigated with antibiotic-containing saline  solution.  Hemostasis was assured, and the leads were then attached to a  Promote RF 3207 ICD, model [id number].  Through the device, bipolar P-wave  was 2.4, the pace impedance of 40 ohms, threshold 5 V at 0.5 msec.  The  R-wave was 8.2 with a pace impedance of 740 ohms, threshold 0.5 at 0.5  msec and the LV impedance of 450 ohms with a threshold of 0.7 V at 0.5  msec.  The high-voltage impedance was 48 ohms.   Hemostasis was obtained.  The leads and the pulse generator were placed  in the pocket, secured to the prepectoral fascia.  The pocket was  copiously irrigated with antibiotic-containing saline solution.  I  elected to put Surgicel into the end of the medial, caudal, and cephalad  extensions of the pocket.   DFT testing was then undertaken.  Ventricular fibrillation was induced  via the T-wave shock after a total duration of 6 seconds of 20 joule  shock was delivered through a measured resistance of 43 ohms,  terminating ventricular fibrillation and restoring sinus rhythm.  The  patient's pocket was then closed in 3 layers in normal fashion.  The  patient's wound was washed, dried, and a benzoin and Steri-Strip  dressing was applied.  Needle counts, sponge  counts, and instrument  counts were correct at the end of the procedure according to the staff.  The patient tolerated the procedure without apparent complication.   I should note that at an LV offset of -20, the patient's QRS duration  has decreased from 168 to 148.  Other permutations  of LV offset might be  worth pursuing in this gentleman with bifascicular right bundle-branch  block.       Duke Salvia, MD, [facility]  Electronically Signed     SCK/MEDQ  D:  09/30/2008  T:  10/01/2008  Job:  [id number]   cc:   Gerlene Burdock A. Alanda Amass, M.D.  Electrophysiology Laboratory

## 2011-04-20 NOTE — Discharge Summary (Signed)
Robert, [name]                ACCOUNT NO.:  [id number]   MEDICAL RECORD NO.:  [id number]          PATIENT TYPE:  INP   LOCATION:  2024                         FACILITY:  [facility]   PHYSICIAN:  Richard A. Alanda Amass, M.D.DATE OF BIRTH:  1956/07/02   DATE OF ADMISSION:  11/29/2008  DATE OF DISCHARGE:  12/09/2008                               DISCHARGE SUMMARY   DISCHARGE DIAGNOSES:  1. Unstable angina with negative myocardial infarction.  2. Coronary artery disease with multiple procedures and most recently      in October tandem drug-eluting stenting of the left anterior      descending for progression of disease.      a.     Cardiac catheterization this admission revealed a 100% right       coronary artery stenosis at mid which is old, patent circumflex       left anterior descending stent midportion of the vessel patent       with 30-40% in-stent restenosis and left anterior descending       lesions of 40-50% mid and distal, ramus that is severely stenosed       99% and unsuccessful previous attempt with rotational atherectomy       has been done in the past.  The circumflex was patent and gives       rise to bifurcating posterolateral branch distally with one obtuse       marginal branch that is small and severe in disease of 95% it is       not amenable to intervention.  Ejection fraction is decreased now       on 10-15%, no mitral regurgitation.  3. Ischemic cardiomyopathy with decrease in ejection fraction, from      October ejection fraction was 20% and now down to 10-15%.      a.     The patient previously had implantable cardioverter-       defibrillator but in October 2009 it was upgraded to biventricular       implantable cardioverter-defibrillator.  4. Paroxysmal atrial fibrillation continued on Coumadin.  5. Underlying right bundle-branch block.  6. Diabetes mellitus type 2 insulin dependent but much improved from      October.  7. Borderline hypotension.  8.  Hyperlipidemia.  9. Ventricular tachycardia, now with increased amiodarone.   DISCHARGE CONDITION:  Improved and stable.   PROCEDURES:  Combined left heart cath December 03, 2008 by Dr. Madaline Savage with results as stated.   DISCHARGE MEDICATIONS:  1. Aspirin take before Niaspan 81 mg daily.  2. Benazepril one half of 10 mg tablet daily.  3. Lanoxin changed to 0.1875 daily, take one and half tabs of 0.125      tablets daily.  4. Fish oil concentrate 1000 mg daily.  5. Lasix 20 mg daily.  6. Lipitor 40 mg daily.  7. Metformin 500 mg twice a day.  8. Metoprolol 100 mg twice a day.  9. Nitroglycerin sublingual one every 5 minutes x3 as needed p.r.n.  10.Spironolactone take half of the 25 mg tablet equal 12.5 mg daily.  11.Vitamin C 500 mg daily.  12.Vitamin E d-Alpha 400 international units daily.  13.Amiodarone 200 mg increased to two 200 mg tablets twice a day.  14.CoQ 10 daily.  15.Plavix 75 mg daily.  16.Lantus 24 units subcu at bedtime.  17.NovoLog 14 units subcu at breakfast.  18.Coumadin 2.5 mg daily except 1.25 mg on Thursday.  19.NovoLog 12 units insulin subcu at lunch and supper.  20.Niaspan 1000 mg at bedtime.  21.Isosorbide mononitrate has been changed to 15 mg twice a day half      of the 30 mg tablet twice a day.  22.Stop Pepcid.  23.Protonix 40 mg daily.  24.Ranexa 500 mg one twice a day.   DISCHARGE INSTRUCTIONS:  1. Low-sodium heart-healthy diabetic diet.  2. Wash cath site with soap and water.  Call if any bleeding, swelling      or drainage.  3. Increase activity slowly.  May shower.  4. Follow up with Dr. Alanda Amass December 19, 2008 at 11 a.m.  5. Have Coumadin level checked Wednesday, December 11, 2008.  6. Call for tachycardia, chest pain, shortness of breath or increasing      weight.  7. Stop Pepcid.  8. Please no changes with Imdur, Lanoxin, amiodarone and spirolactone.   HISTORY OF PRESENT ILLNESS:  [name] is a 55 year old white married   male patient of Dr. Alanda Amass who presented to the emergency room on  November 29, 2008 with chest pain.  He had just recently seen Dr.  Alanda Amass on January 27, 2008 and was stable at that time and if he had  followup fib flutter on AV ablation would be a possibility.   He had also been seen in the emergency room November 27, 2008 with chest  pain, though we were unsure when he describes the chest pain if it is  possibly AFib which has happened in the past or if this was true angina.  But on November 29, 2008, the patient developed chest pain at rest.  He  was in his recliner, felt a sensation in his chest, took 2 deep breaths  but then chest pain began more not pressure but more of a hurting.  Two sprays of nitroglycerin and relief after 15-20 minutes.  In the  emergency room, he was pain-free, no change in his heart rate.  His wife  has been very concerned due to less and less ability to do activities  secondary to his chest pain.   After his stent was placed in October, he could feed his dogs to shores  and walk but now just walking up the hallway because of chest pain.  When he was discharged in December after the event of chest pain, he was  walking in the hallway without pain.  Imdur had been added to his  medical regimen then and despite this he continues with chest pain.  When he was seen in the ER November 27, 2008 it was felt that he had  continued pain and he should have a heart cath.   The patient also related on admission that at one point they were  thinking of heart transplant and he does not seems continuing like this  and he feels he may need to see Dr. Pernell Dupre again.   He was admitted to the hospital. Coumadin was held and it was planned to  do heart catheterization on Monday.  The INR on admission was 2.5.  The  plan would be to add Lovenox when his INR was less  than 2.   PAST MEDICAL HISTORY:  See problem list.   The patient was admitted, underwent cardiac  catheterization once his INR  was less than 1.6.  Cath occurred December 03, 2008 due  to coronary  artery disease was stable but EF had decreased to 10-15%.   He developed wide complex tachycardia as well as PAF.  We interrogated  this device.  He had had 46 mode switches on December 04, 2008.  EP  consult was obtained and the patient related he felt less palpitations  on Imdur which raise the concern that this may be ischemic motivated  atrial fib.  Amiodarone was increased.  The patient's Coumadin was  restarted and the patient was ambulating in the hallway.  The patient  continued to improve and by December 09, 2008 he was ready for discharge  home, was ambulating without chest pain.  He will follow up as an  outpatient with Dr. Alanda Amass.      Darcella Gasman. [place], N.P.      Richard A. Alanda Amass, M.D.  Electronically Signed    LRI/MEDQ  D:  01/13/2009  T:  01/14/2009  Job:  [id number]   cc:   [name]. [name], MD  Duke Salvia, MD, [facility]

## 2011-04-20 NOTE — Consult Note (Signed)
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  When we met in October, the issue of  upgrade to a CRT device was raised and was undertaken with a significant  improvement in his congestive heart failure status from a class III to a  class II.   He also has known severe chronic obstruction disease with a prior  anterior wall MI with stent in 2001.  When he presented in October,  catheterization demonstrated high-grade lesion in his intermediate  branch, stent deployment in which was unsuccessful.  There was re-  stenting of the LAD and mid lesion.  Ejection fraction by angiography  was 20-25%.   The patient had also had previous inappropriate shock via his ICD and  for this and nonsustained VT, he had been put on amiodarone.   This hospitalization was precipitated by palpitations and exertional  chest pain.   This had largely developed after he saw me in the office, at which time  we had activated rate response for a flat heart rate excursion.  On  initial evaluation, short runs of nonsustained atrial fibrillation and  nonsustained ventricular tachycardia were identified.   The patient has been having chronic angina and he takes nitroglycerin  for.   His past medical history is notable for:  1. Hypertension.  2. Dyslipidemia.  3. Diabetes.  4. 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  5. He also has a history of prostate cancer.   His past surgical history is as  noted above.   MEDICATIONS ON ARRIVAL:  1. Aspirin 81.  2. Benazepril 10.  3. Digoxin 0.125.  4. Lipitor.  5. Metformin 500 daily.  6. Metoprolol 100 b.i.d.  7. Nitroglycerin p.r.n.  8. Amiodarone 200 b.i.d.  9. Plavix 75.  10.Lantus, NovoLog.  11.Spironolactone 25.   He has no known drug allergies.   SOCIAL HISTORY:  He is married.  He has 1 son.  He is disabled.  He does  not use cigarettes, alcohol, or recreational drugs.   His family history is noncontributory.   His review of systems is also noncontributory.   On examination, his blood pressure was 104/66.  His pulse was 72.  His  respirations were 16 and unlabored.  He is afebrile.  His HEENT exam  demonstrated no icterus or xanthoma.  His neck veins were flat.  The  carotids were brisk and full bilaterally without bruits.  The back was  without kyphosis, scoliosis.  His lungs were clear.  Heart sounds were  regular without murmurs or  gallops.  The abdomen was soft with active  bowel sounds.  There was no hepatomegaly.  Femoral pulses were 2+.  Distal pulses were intact.  There was no clubbing, cyanosis or edema.  The neurological exam was grossly normal.  His skin was warm and dry.   His laboratories were notable for ruling out for myocardial infarction.   Telemetry was notable as described previously.   His device was interrogated on 2 occasions and re-programmed today with  a decrease in his sloped from 12 to 9 and a decrease in his maximum  sensory rate from 120 to 110 and an increase in his acceleration of  recovery from slow to medium.   IMPRESSION:  1. Palpitations and large part related to overzealous rate response of      programming.  2. Congestive heart failure - class III, now at class II following      cardiac resynchronization therapy defibrillator implantation as      well as reperfusion of his left anterior descending.  3. Paroxysmal atrial fibrillation mostly short runs.  This is notably       not atrial flutter.  4. Nonsustained ventricular tachycardia.  5. Status post implantable cardioverter-defibrillator with a recent      upgrade to a cardiac resynchronization therapy device with a      history of inappropriate shocks for atrial fibrillation, which the      amiodarone was initiated.  6. Coronary artery disease.      a.     Prior myocardial infarction with remote percutaneous       coronary intervention of the left anterior descending, more recent       re-stenting of the left anterior descending and failed reperfusion       of a ramus lesion.  7. Ejection fraction of 25%.  8. Chronic ongoing angina.   DISCUSSION:  [name] symptomatic palpitations are likely and large  part related to his pacemaker and the re-programming that we did a  number of weeks ago.  We have re-programmed it again and tested and his  heart rates are much better.   He clearly has evidence of demand ischemia and further therapy to  maintain coronary perfusion as well as decreased oxygen demand by  minimizing his heart rate are appropriate.  We have tried to do the  latter with his pacemaker re-programming; Dr. Garen Lah has augmented  beta-blockers may also help in this regard.   At this point, I would:  1. Consider discharge.  2. Decrease his maximum heart rate as we have done.  3. Decrease his amiodarone back to his chronic dose with hopeful down      titration further to 200 mg a day over the next number of weeks to      months.  We will defer to Dr. Alanda Amass.  4. Agree with Imdur for his chronic angina.   Thank you for the consultation.      Duke Salvia, MD, [facility]  Electronically Signed     SCK/MEDQ  D:  11/14/2008  T:  11/15/2008  Job:  [id number]

## 2011-04-20 NOTE — Op Note (Signed)
NAMEBABYBOY, LOYA                ACCOUNT NO.:  [id number]   MEDICAL RECORD NO.:  [id number]          PATIENT TYPE:  AMB   LOCATION:  DAY                           FACILITY:  [facility]   PHYSICIAN:  R. Roetta Sessions, M.D. DATE OF BIRTH:  April 04, 1956   DATE OF PROCEDURE:  DATE OF DISCHARGE:                               OPERATIVE REPORT   INDICATIONS FOR PROCEDURE:  A 55 year old gentleman status post APR 1  year ago.  He has been seen and followed by Dr. Domingo Sep at [facility],  and he is doing well.  He is here for his 1-year surveillance.  Risks,  benefits, alternatives and limitations have been reviewed, and questions  answered.  He has an implantable defibrillator.  We have a magnet  available if needed.  His questions were answered.  He is agreeable.  Please see the documentation in the medical record.   PROCEDURE NOTE:  O2 saturation, blood pressure, and pulse of the patient  monitored throughout the entire procedure.  Conscious sedation, Versed 5  mg IV and Demerol 75 mg IV in divided doses.   INSTRUMENT:  Pentax video chip system.   FINDINGS:  The colostomy appears healthy.  Digital exam revealed no  abnormalities.  Endoscopic Findings:  The prep was somewhat suboptimal  with some liquid stool debris throughout the colon which was fairly  easily washed and suctioned out.  Examination of the residual rectum  from the colostomy was undertaken.  The scope was advanced all the way  to the cecum, ileocecal valve/appendiceal orifices.  These structures  were well seen and photographed for the record.  The scope was  cautiously withdrawn.  All previously mentioned mucosal surfaces were  again seen.  The residual colonic mucosa appeared entirely normal.  The  patient tolerated the procedure well and was reacted in Endoscopy.   IMPRESSION:  Surveillance colonoscopy through colostomy, normal-  appearing residual colonic mucosa.   RECOMMENDATIONS:  Repeat colonoscopy in 3  years.      Jonathon Bellows, M.D.  Electronically Signed     RMR/MEDQ  D:  08/26/2008  T:  08/26/2008  Job:  [id number]   cc:   Angelia Mould. Derrell Lolling, M.D.  [address]  [place]  [place] [address]   Gerlene Burdock A. Alanda Amass, M.D.  Fax: [phone]   Dani Gobble, MD  Fax: [phone]   [name]. [name], MD  Fax: [phone]

## 2011-04-23 NOTE — Discharge Summary (Signed)
[place]. [facility]  Patient:    [name], [name] Visit Number: [id number] MRN: [id number]          Service Type: MED Location: 2000 2031 01 Attending Physician:  Ruta Hinds Dictated by:   Darcella Gasman. [place], F.N.P.C. Admit Date:  08/06/2001 Discharge Date: 08/12/2001   CC:         Doylene Canning. Ladona Ridgel, M.D. [facility]  Dr. [name], [facility], [place], [place]   Discharge Summary  DISCHARGE DIAGNOSES:  1. Unstable angina with negative myocardial infarction.  2. Coronary artery disease.     a. History of anterior wall myocardial infarction December 2000 with        percutaneous transluminal coronary angioplasty and stenting of the left        anterior descending January 2001 with patency of that stent.  3. Ischemic cardiomyopathy, low-output syndrome, on medical therapy,     class II-III, ejection fraction 20%.  4. Nonsustained ventricular tachycardia.     a. Placement of automatic implantable cardioverter/defibrillator by        Dr. Lewayne Bunting.  5. Hyperlipidemia.  6. Hyperglycemia.  DISCHARGE CONDITION:  Improved.  PROCEDURES:  1. August 08, 2001 combined left heart catheterization by     Dr. Susa Griffins.  2. August 11, 2001 implantation of single-chamber implantable cardioverter     defibrillator by Dr. Lewayne Bunting.  DISCHARGE MEDICATIONS:  1. Lasix 20 mg one daily.  2. Aspirin 81 mg daily.  3. Lipitor 5 mg daily.  4. Toprol XL 100 mg daily.  5. Lanoxin 0.125 one daily.  6. Niaspan 1 at bedtime as before.  7. Aldactone 25 mg daily.  8. Lotensin 40 mg 1 daily, which is a new dose, was at b.i.d.  9. Imdur 30 mg 1/2 tablet daily. 10. Protonix 40 mg 1 daily. 11. Vitamin C, vitamin E, CoQ10 as before. 12. Nitroglycerin sublingual p.r.n. chest pain.  DISCHARGE INSTRUCTIONS:  1. No lifting, no driving, no strenuous activity until you see Dr. Alanda Amass.  2. Low-fat/low-salt diet.  3. Do not raise left arm up over your  head.  4. Follow up in one week with Dr. Alanda Amass.  Call Monday for the appointment     date and time.  5. Follow up with Dr. Ladona Ridgel in three months for a onetime appointment.  HISTORY OF PRESENT ILLNESS:  A 55 year old white married male patient of Dr. Alanda Amass was admitted on August 06, 2001 by Dr. Jenne Campus with chest pain.  The patient had been doing well with his known ischemic cardiomyopathy and coronary disease until 11 p.m. on August 05, 2001.  While watching T.V., complained of chest pressure with shortness of breath.  Similar to prior MI except this time no nausea.  He came to the emergency room.  Pressure improved with three nitroglycerin sublingually.  EKG with right bundle branch block and inferior-anterior Q waves, which were old.  Initial CKs were negative.  The patient was admitted for IV heparin and IV nitroglycerin to the CCU.  PAST MEDICAL HISTORY:  Coronary disease with anterior MI in December 2000 and PTCA and stent at [facility] to the LAD.  He was recatheterized in June 2001 by Dr. Alanda Amass.  EF was 20%.  The LAD stent had a 30% stenosis but otherwise patent.  He was subsequently sent to the congestive heart failure clinic at [facility] with Dr. Elayne Guerin.  Also, plans were made for possible AICD implantation.  Other history:  Hypertension, hypercholesterol, and  nonsustained ventricular tachycardia.  OUTPATIENT MEDICATIONS:  1. Lasix 20 daily.  2. Aspirin 81 daily.  3. Lipitor 10 daily.  4. Toprol XL 100 daily.  5. Aldactone 25 daily.  6. Lotensin 40 mg twice a day.  7. Nitroglycerin p.r.n.  8. CoQ10.  9. Vitamin E. 10. Vitamin C. 11. Niaspan 500 at bedtime.  ALLERGIES:  No known allergies.  FAMILY HISTORY/SOCIAL HISTORY/REVIEW OF SYSTEMS:  See H&P.  PHYSICAL EXAMINATION:  VITAL SIGNS:  At discharge:  Blood pressure 100/52, pulse 72, respirations 21, temperature 97.2, room air oxygen saturation 96%.  GENERAL:  Alert and oriented white  male in no acute distress.  LUNGS:  Clear without rales, rhonchi, or wheezes.  CHEST:  Chest wall incision bandage was removed.  Wound without erythema or drainage.  HEART:  Regular rate and rhythm.  S1, S2.  No murmur or gallop.  LUNGS:  Clear.  ABDOMEN:  Soft and nontender.  LABORATORY DATA:  Hemoglobin 13.9, hematocrit 40, WBC 9.4, MCV 90.9, platelets 207 to 189, neutrophils 57, lymphs 32, monos 8, eos 3, basos 0.  Pro time 12.8, INR 0.9, PTT 30.  On heparin, he was therapeutic prior to discharge, 27. Chemistry:  Sodium 138.  Potassium 3.5, was supplemented and was up to 3.8. Chloride 105, CO2 25, glucose 205 to 142, BUN 15, creatinine 1.2, calcium 9.3, total protein 6.3, albumin 3.1, AST 19, ALT 16, ALP 64, total bilirubin 0.9, magnesium 1.9.  Cardiac enzymes:  86, 68, and 66.  MB 2.4, 1.6, 1.5. Troponin-I 0.02 to 0.01.  Lipids:  Total cholesterol 123, triglycerides 173, HDL 26, LDL 62.  Lipitor was decreased to 5 mg to see if we can get improvement in the HDL.  TSH 3.293.  Digoxin level 0.2.  Chest x-ray initially:  Mild cardiac enlargement without acute pulmonary process.  Follow-up after placement of ICD reviewed by Dr. Ladona Ridgel.  EKG: Initially sinus rhythm, right bundle branch block, old inferior-anterior MIs. They remained stable throughout hospitalization.  The patient did have nonsustained ventricular tachycardia periodically.  Cardiac catheterization August 08, 2001:  EF was 20%.  LAD had calcific 40-50% concentric narrowing with good residual lumen in the proximal third and a large ______ had a 90% ostial stenosis.  First diagonal was totally occluded at its origin from the LAD and filled late and faintly, which was unchanged from previous catheterization.  At the junction of the proximal mid third of the LAD, just before the small diagonal 2, the stent was patent with some minor decreased  density, about 30% narrowing with fair flow, and that had not changed.   The moderately large optional diagonal had diffuse 70-80% narrowing throughout its proximal third mid portion, which represented progression of disease from his prior procedure.  Circumflex was dominant with 60% lesion in the second marginal, which was unchanged.  The right was a nondominant moderate-sized vessel up to its distal third, where there was just stream-like fillings of the small PDA branches, which was functionally occluded.  August 11, 2001: Implantation of AICD, a Guidant Ventak Prism II-VR AICD model number [id number], serial number [id number].  The patient tolerated the procedure.  HOSPITAL COURSE:  Mr. Hersey Maclellan was admitted by Dr. Jenne Campus August 06, 2001 after developing chest pain while watching television.  This was similar to his chest pain with his MI of December 2000.  He came to the emergency room, was improved with nitroglycerin, and was put on heparin and admitted to the coronary care unit for rule out  MI.  The patients cardiac enzymes were negative.  He did have episodic nonsustained ventricular tachycardia.  He had already been decided that he was a candidate for AICD. With his cardiomyopathy, Dr. Lewayne Bunting was contacted.  Cardiac catheterization was done by Dr. Alanda Amass with results as stated previously.  The patient was found to be stable from a cardiac standpoint and underwent AICD placement.  He tolerated that procedure well and by August 12, 2001 was ambulating in the hall.  The AICD site was healing.  No drainage and no erythema and the patient was afebrile.  He was felt ready by Dr. Aleen Campi, who discharged the patient.  Dr. Ladona Ridgel agreed to the discharge and he would follow up with Dr. Alanda Amass and then three months with Dr. Ladona Ridgel. Dictated by:   Darcella Gasman [place], F.N.P.C. Attending Physician:  Ruta Hinds DD:  08/13/01 TD:  08/14/01 Job: [id number]

## 2011-04-23 NOTE — Cardiovascular Report (Signed)
[place]. [facility]  Patient:    [name], [name] Visit Number: [id number] MRN: [id number]          Service Type: MED Location: CCUA 2922 01 Attending Physician:  Ruta Hinds Proc. Date: 08/08/01 Admit Date:  08/06/2001   CC:         [facility] LAB  Lenise Herald, M.D.  Patrecia Pour, M.D. c/o [facility], [place], [place].  Elayne Guerin, M.D. c/o Department of Cardiology, [place] [facility] [place], [facility]             ure Clinic   Cardiac Catheterization  PROCEDURE:  Retrograde central aortic catheterization, selective coronary angiography by Judkins technique, left ventricular angiogram by RAO and LAO projections, subselective left internal mammary artery.  Catheterization was performed with 6 French 4 cm tapered cordis preformed coronary pigtail catheters through a 6 [place] short daig side-arm sheath that was inserted into the RFA with a single anterior puncture utilizing modified Seldinger technique under 1% Xylocaine anesthesia after 5 mg Valium p.o. premedication in the post-absorptive state.  Left ventricular angiogram was done the RAO and LAO projection at 25 cc. 14 cc. per second and 20 cc. 12 cc. per second.  Subselective LIMA was injected by hand with the right coronary catheter.  Pullback pressure to the CA showed no gradient across the aortic valve.  Catheter was removed. Side-arm sheath was flushed.  The patient received 2 mg of Nubain during the procedure for sedation.  He tolerated the procedure well and was transferred back to the holding area for sheath removal and pressure hemostasis in stable condition.  The patient maintained sinus rhythm throughout the procedure with right bundle branch block pattern on EKG.  PRESSURES:  LV 116/0             LV end-diastolic pressure of 24 mmHg.             CA 116/70 mmHg.  There is no gradient across the aortic valve on catheter pullback.  FLUOROSCOPY:  Fluoroscopy  showed 1 to 2+ proximal CA, circumflex, and LAD calcification.  There was no intracardiac or valvular calcification seen.  LEFT VENTRICULAR ANGIOGRAM:  The left ventricular in the RAO and LAO projection showed severe global hypokinesis with segmental wall motion abnormality of the anteroapical and anteroseptal segments down to the apex in the distal third of the inferior wall.  In the LAO projection there was fair contraction of the posterior wall and inferior wall in the RAO basally. Estimated EF, however, was markedly reduced at approximately 20%.  There was no significant mitral regurgitation present.  The main left coronary was normal.  The left anterior descending had calcific 40-50% concentric narrowing with good residual lumen in the proximal third before the large SP-1.  The large SP-1 had 90% ostial stenosis.  The first diagonal branch was totally occluded at its origin from the LAD and filled late and faintly (this is unchanged).  At the junction of the proximal mid third of the LAD just before the small DX-2, the previously placed stent was patent with some minor decreased dye density and about 30% narrowing with fair flow.  There were irregularities of the remainder of the LAD with no significant stenosis and there was a small third diagonal from the mid LAD. The LAD bifurcated at the apex.  The moderately large optional diagonal had diffuse 70-80% narrowing throughout its proximal third and mid portion which represented progression of disease from his prior procedure.  The circumflex itself was a dominant vessel.  There was a 60% lesion of the second marginal branch which was unchanged. The distal circumflex was comprised of the large posterolateral branch and PDA branch which showed no significant disease and the PAVG branches were intact.  The right coronary was a nondominant moderate sized vessel up to its distal third where there was just string like fillings of  the small PDA branch which was functionally occluded with only faint antegrade flow and possibly some recannulation.  This had progressed from his prior angiogram.   His distal RCA is "string like" and fills late and is functionally occluded and is not a candidate for intervention.  His optional diagonal was of borderline significance and does not appear critical at this time and there is no re-stenosis of the LAD.  Unfortunately this 55 year old disabled married father has known severe coronary disease and severe ischemic cardiomyopathy.  He had remote anterior wall MI treated with t-PA at [facility] and sent to [facility] where he underwent subsequent stenting of his proximal LAD (101).  He has been treated for severe ischemic cardiomyopathy without any angina and no significant and class 2 symptoms of low output and CHF.  He has been followed by Dr. Elayne Guerin on referral at [facility] as well in view of his young age and possible potential ultimate need for cardiac transplantation.  We had previously discussed implantation of a prophylactic ICD for this young gentleman and I have also discussed this with Dr. Pernell Dupre and it was both of out recommendations that we should proceed with this.  While in the hospital on this occasion, he had wide complex tachycardia that looked more like SVT.  In retrospect he has had some episodes of palpitations at home without syncope or presyncope.  The patient has a severe ischemic cardiomyopathy, no re-stenosis of his LAD with progression of disease in his optional diagonal and right.  He has functional occlusion of his distal right and occlusion of his DX-1 proximal to the patent LAD stent.  He has severe LV dysfunction with EF 20% or less.   I recommend continued medical therapy.  I would recommend prophylactic ICD placement in this high risk patient based on beta 2 data and EP opinion about his recent tachycardia which may require  antiarrhythmic therapy as well (possibly amiodarone).  The patient has a right bundle branch block on his EKG without a synchronous septal contraction and is probably not a good candidate to consider biventricular pacing unless he develops progressive symptoms and this serves as a potential bridge to transplantation.  CATHETERIZATION DIAGNOSES: 1. Atherosclerotic heart disease status post acute anterior wall    myocardial infarction December 2000. 2. Subsequent PTCA and stenting proximal LAD January 2001. 3. Ischemic cardiomyopathy low output symptoms on medical therapy class    II-III. 4. Nonsustained wide complex tachycardia, possible SVT versus VT. 5. Hyperlipidemia. 6. Past cigarette abuse. 7. Mild exogenous obesity. 8. Previously normal renal arteries and patent IMAs on last catheterization of    May 18, 2001.Attending Physician:  Ruta Hinds DD:  08/08/01 TD:  08/08/01 Job: [id number]

## 2011-04-23 NOTE — Discharge Summary (Signed)
NAMECONN, TROMBETTA                ACCOUNT NO.:  [id number]   MEDICAL RECORD NO.:  [id number]          PATIENT TYPE:  INP   LOCATION:  2004                         FACILITY:  [facility]   PHYSICIAN:  Richard A. Alanda Amass, M.D.DATE OF BIRTH:  08-15-1956   DATE OF ADMISSION:  09/22/2008  DATE OF DISCHARGE:  09/23/2008                               DISCHARGE SUMMARY   DISCHARGE DIAGNOSES:  1. Paroxysmal atrial fibrillation.  2. Implantable cardioverter-defibrillator discharge.  3. History of implantable cardioverter-defibrillator placement with a      implantable cardioverter-defibrillator Guidant model, February      2009.  4. Ischemic cardiomyopathy with an ejection fraction of 30-40%.  5. Coronary artery disease with history of catheterization in      September 2002, stent was patent in his left anterior descending at      that time.  He has had some other disease and 90% ostial diagonal      that was small, diagonal 1 was occluded, his obtuse marginal 2 was      60%, right coronary artery was occluded distally, left anterior      descending had 40-50%, a history of anterior myocardial infarction      in December 2000, and a history of stent to his left anterior      descending in January 2001.  6. Congestive heart failure.  7. History of an supraventricular tachycardia.  8. History of prostate and rectal cancer status post colostomy.  9. Dyslipidemia.   LABORATORY DATA:  Hemoglobin 12.9, hematocrit 38.1, platelets 175, WBC  7.3.  Sodium 134, potassium 3.9, BUN 16, creatinine 0.87, glucose was  234.  Albumin 3.4, total protein 6.3.  Magnesium 1.9.  Total cholesterol  134, triglycerides 336, HDL 23, LDL 167.  CK-MB negative x1 and a BNP  was 42.  TSH 2.316.  Digoxin 0.2.  There is no chest x-ray in the chart.   DISCHARGE MEDICATIONS:  1. Lopressor 100 mg b.i.d.  2. Niaspan 1000 mg at bedtime.  3. Benazepril 20 mg b.i.d.  4. Spironolactone 25 mg every day.  5. Furosemide 20 mg  daily.  6. Lipitor 10 mg at bedtime.  7. Actos 45 mg a day.  8. Metformin 500 mg a day.  9. Omega-3 fish oil 1000 mg a day.  10.Vitamin E 400 international units a day.  11.Vitamin C 500 mg a day.  12.Amiodarone 400 mg twice per day through September 28, 2008, then      amiodarone 200 mg a day.  13.Decrease digoxin to half of 0.125 mg tablet daily.   HOSPITAL COURSE:  Mr. Seiber is a 55 year old white male with a history  of coronary artery disease, ischemic cardiomyopathy, came into the  hospital after feeling his ICD shock him.  He states he had been feeling  well and that he was started to feel a sensation in his chest, described  as butterflies and then a burning in his chest.  He was sitting,  watching TV when he received a shock.  He denied any chest pain,  shortness of breath, lightheadedness, dizziness, presyncope, or any  syncope.  Records were reviewed, and pacemaker interrogation was done  and it was found that he was shocked probably for atrial fib.  He had an  ATP burst x2, and they were not successful in converting his rhythm, and  he then received 6 joules shock, which slowed his rate to 100 beats per  minute.  He has a single chamber ICD, and he was also seen by Dr.  Domingo Sep.  He was admitted and put on IV amiodarone.  The following day,  he was seen by Dr. Alanda Amass.  Dr. Alanda Amass felt that he could be  changed to p.o. amiodarone and that he could be discharged home on  September 23, 2008.  He was stable and without any complaints.      Lezlie Octave, N.P.      Richard A. Alanda Amass, M.D.  Electronically Signed    BB/MEDQ  D:  12/19/2008  T:  12/20/2008  Job:  [id number]   cc:   [name]. [name], MD

## 2011-04-23 NOTE — Procedures (Signed)
[place]. [facility]  Patient:    [name], [name] Visit Number: [id number] MRN: [id number]          Service Type: MED Location: [facility] 2922 01 Attending Physician:  Ruta Hinds Dictated by:   Doylene Canning. Ladona Ridgel, M.D. [facility] Proc. Date: 08/11/01 Admit Date:  08/06/2001   CC:         Richard A. Alanda Amass, M.D.  Kathrine Cords,  Clinic  Dr. Artis Delay   Procedure Report  PROCEDURE:  Implantation of a single-chamber implantable cardioverter- defibrillator utilizing fluoroscopy and venography.  INTRODUCTION:  The patient is a 55 year old man with a premature atherosclerosis, status post myocardial infarction.  He has right bundle branch block at baseline and was in the hospital recently with chest pain and found to have a long run of nonsustained ventricular tachycardia associated with palpitations.  He denies any history of syncope.  Because of his wide QRS and EF of 20% along with nonsustained VT, he is now referred for ICD implantation.  DESCRIPTION OF PROCEDURE:  After informed consent was obtained, the patient was taken to the diagnostic EP lab in the fasted state.  After the usual preparation and draping, intravenous fentanyl and midazolam were given for sedation.  A total of 30 cc of lidocaine was infiltrated in the left infraclavicular region.  A 9 cm incision was carried out over the left infraclavicular region and electrocautery utilized to dissect down to the subpectoralis fascia.  Twenty cubic centimeters of IV contrast was injected into the left upper extremity venous system, demonstrating a patent subclavian vein.  This was subsequent punctured, and the Guidant model [id number] defibrillation lead was placed in the right ventricle.  Mapping was carried out in the right ventricle and at the final site, the R-waves measured 12 millivolts and the pacing threshold was 0.4 volts at 0.5 milliseconds, with a pacing impedance of 940  Ohms.  With the ventricular lead in satisfactory position, it was secured to the subpectoralis fascia with a figure-of-eight silk suture.  In addition, the sewing sleeves were secured with silk suture. At this point, electrocautery was utilized to fashion a subcutaneous pocket. Having done this, hemostasis was obtained with electrocautery.  Kanamycin was utilized to irrigate the wound.  The Guidant Ventak Prizm II-VR AICD, model number 1860, serial number [id number], was connected to the defibrillation lead and placed in the subcutaneous pocket.  The generator was secured with a silk suture.  At this point defibrillation threshold testing was carried out.  After the patient was placed utilizing fentanyl and Versed into a state of deeper IV sedation, the first DFT test was carried out.  VF was induced with a T-wave shock and a 14-joule shock utilized to restore sinus rhythm.  Five minutes was allowed to elapse, and a second DFT test was carried out.  Again VF was induced with a T-wave shock, and a 14-joule shock terminated VF, restoring sinus rhythm.  At this point, no additional DFT testing was carried out, and kanamycin irrigation was utilized to irrigate the wound again, and the incision was closed with a layer of 2-0 Vicryl, followed by a layer of 3-0 Vicryl, followed by a layer of 4-0 Vicryl.  Benzoin was painted on the skin and Steri-Strips were applied and a pressure dressing placed, and the patient returned to his room in satisfactory condition.  COMPLICATIONS:  There were no immediate procedural complications.  RESULTS:  This demonstrates successful implantation of a Guidant single-chamber defibrillator in a patient with  ventricular tachycardia and ischemic cardiomyopathy. Dictated by:   Doylene Canning. Ladona Ridgel, M.D. [facility] Attending Physician:  Ruta Hinds DD:  08/11/01 TD:  08/11/01 Job: [id number]

## 2011-04-23 NOTE — Consult Note (Signed)
[place]. [facility]  Patient:    [name], [name] Visit Number: [id number] MRN: [id number]          Service Type: MED Location: [facility] 2922 01 Attending Physician:  Ruta Hinds Dictated by:   Chinita Pester, C.R.N.P. Proc. Date: 08/09/01 Admit Date:  08/06/2001   CC:         Dr. Evalyn Casco A. Alanda Amass, M.D.  [facility]   Consultation Report  REASON FOR CONSULTATION:  Nonsustained ventricular tachycardia, cardiomyopathy.  HISTORY OF PRESENT ILLNESS:  This is a 55 year old gentleman with a previous history of MI in June 2001 who received tPA and PTCA and stenting of his LAD. Ejection fraction was 20%.  He presented to the emergency room with the complaint of chest pain similar to the chest pain he had with his MI.  Cardiac enzymes were negative.  Cardiac catheterization was performed which showed an EF of 20%.  No change in coronary artery disease, and he was to be managed medically.  Prior to catheterization, he had a 17-beat run of nonsustained VT, monomorphic.  Dr. Alanda Amass has spoken with the patient in the past about an ICD placement with regards to the MADIT II trial.  The patient states positive palpitations at times with no loss of consciousness or presyncope.  PAST MEDICAL HISTORY:  Nonsustained VT, cardiomyopathy with an EF of 20%, CAD, PTCA to the LAD June 2001, hypertension, hypercholesterolemia.  PAST SURGICAL HISTORY:  None.  SOCIAL HISTORY:  He lives with his wife, denies tobacco, alcohol, or drugs.  FAMILY HISTORY:  Positive for CAD.  ALLERGIES:  No known drug allergies.  MEDICATIONS:  1. Lasix 20 daily.  2. Vitamin C daily.  3. Vitamin E daily.  4. Zocor 20 q.h.s.  5. Lotensin 40 daily.  6. Toprol 100 daily.  7. Digoxin 0.125 daily.  8. Niacin 500 nightly.  9. Aldactone 25 daily. 10. Aspirin 325 daily. 11. Protonix 40 daily. 12. Imdur 15 a day.  REVIEW OF SYSTEMS:  Unremarkable with the  exception of cardiovascular positive chest pain, positive palpitations at times.  LABORATORY DATA:  Telemetry showed 17 beat run of nonsustained monomorphic ventricular tachycardia.  Cardiac enzymes were negative.  Cardiac catheterization showed an EF of 20% with no change in coronary artery disease.  PHYSICAL EXAMINATION:  VITAL SIGNS:  Pulse 67, respiratory rate 20 blood pressure 108/56.  Telemetry showed normal sinus rhythm.  GENERAL:  A well-developed 55 year old male lying in bed in no apparent distress.  HEENT:  Sclerae clear.  NECK:  Supple, nontender, no bruits.  HEART:  Regular rhythm.  Positive S1 and S2.  No S3, no murmur, and a split S2.  CHEST:  Lungs clear to auscultation bilaterally.  ABDOMEN:  Soft, round, nontender.  Normoactive bowel sounds.  EXTREMITIES: No edema.  NEUROLOGIC:  Awake, alert, and oriented x 3.  ASSESSMENT/PLAN:  As per Dr. Ladona Ridgel.  1. Ischemic cardiomyopathy after myocardial infarction status post     percutaneous coronary intervention with an ejection fraction of 20%.  2. Ventricular tachycardia, nonsustained, symptomatic.  3. Right bundle branch block.  RECOMMENDATIONS:  I discussed the treatment options with the patient.  He is interested in and would like to proceed with an ICD implant for prevention of sudden cardiac death.  Will discuss scheduling with Dr. Alanda Amass.  I might be able to perform procedure on Friday as per Dr. Ladona Ridgel. Dictated by:   Chinita Pester, C.R.N.P. Attending Physician:  Ruta Hinds DD:  08/10/01 TD:  08/10/01 Job: [id number]

## 2011-04-23 NOTE — Procedures (Signed)
[place]. [facility]  Patient:    [name], [name]                       MRN: [id number] Proc. Date: 05/18/00 Adm. Date:  [id number] Disc. Date: [id number] Attending:  Ruta Hinds CC:         Richard A. Alanda Amass, M.D.             [facility]             Lenise Herald, M.D.                           Procedure Report  PROCEDURES:  Right heart catheterization, retrograde left heart catheterization, selective coronary angiography by Judkins technique, thermodilution cardiac output, simultaneous A-V O2 difference, LV angiogram with RAO and LAO projections, aortic root angiogram with LAO projection, abdominal aortic angiogram, midstream PA projection, right femoral artery closure via a #6 [place] per close device successful.  DESCRIPTION OF PROCEDURE:  The above procedures were done through the RFA and RFE with #6 [place] arterial and #8 [place] venous sheaths placed by modified percutaneous Seldinger technique with #18 thin-walled needle under 1% Xylocaine anesthesia, 2 mg of Versed for sedation, preoperative 5 mg Valium p.o., and 2 mg of Nubain during the procedure for sedation.  A right heart catheterization was done with a triple lumen flow directed Swan-Ganz catheter. Simultaneous LV and PCW pressures were recorded through a pigtail #6 French catheter.  Simultaneous A-V O2 difference, along with right heart pullback pressures and cardiac thermodilution cardiac output was obtained.  An LV angiogram was done in the RAO and LAO projections at 25 cc/s and 14 cc/s for each projection.  Aortic pullback pressure of the CA was obtained.  Aortic root injection was done at 30 cc/s and 15 cc/s in the LAO projection.  The catheter was pulled back to the abdominal aorta, above the level of the renal arteries, and an abdominal aortic angiogram was done at 30 cc/s and 20 cc/s.  Selective coronary angiography was performed with a #6 French 4 cm taper. ______  coronary catheters.  Iliac angiogram was done on follow through from the abdominal angiogram showing good position in the CFA of the arterial sheath.  The arteriotomy was then closed using standard technique with a #6 [place] per close device.  The venous sheath was removed, and pressure hemostasis was used on the right groin.  The patient was transferred to the holding area for postoperative care in stable condition.  PRESSURES:  1. RA: 17/12; mean 14 mmHg.  2. PRV: 67/16; RVDP 22 mmHg.  3. PA: 68/37; mean 48 mmHg.  4. PCW: V = 50; A = 27; mean 34 mmHg.  5. LV: 112/18; LVDP 28-32 mmHg.  6. CA: 112/55 mmHg.  7. CO/CI: 5.1/2.25 L/min/sq m.  8. Aortic saturation 94%, PA saturation 80%.  9. Simultaneous A-V O2 difference 3.1 volume %. 10. PVR: 172; SVR: 1129; total pulmonary resistance: 721 DSC to the minus     fifth. 11. A-V O2 difference: 3.1 volume %.  ASSESSMENT:  1. There was no gradient across the aortic valve on catheter pullback.  2. There was no gradient between simultaneous LV and PCW pressures.  3. LV angiogram in the RAO and LAO projections showed multiple, segmental,     wall motion abnormalities.  From the mid anterolateral wall to the distal     third of  the anterior wall, there was paradoxical motion.  There was     hypo-akinesis of the remainder of the anterolateral wall and inferior     wall.  In the LAO projection, there was akinesis of the septum down to the     apex, where there was paradoxical motion, hypo-akinesis of the posterior     apical segment, and fairly good contraction of the mid to high posterior     wall and basilar quarter of the anterior wall.  No mitral regurgitation     seen.  4. Estimated EF was less than 20%.  5. Fluoroscopy revealed 2+ coronary calcification of the right and LAD.     There was no intracardiac or valvular calcification.  6. The main left coronary was normal.  7. The LAD had 40-50% concentric narrowing in the proximal portion  beyond the     ostia.  In the proximal third of the LAD before the small, first diagonal     branch, there was a visualized stent that appeared to be a probable ACS     Multilink stent with less than 30% narrowing placed at 101.  There was     TIMI-III flow through the LAD, which coursed to the apex and under the     surface of the heart where it bifurcated.  The first diagonal was thin,     bifurcated, and had no significant stenosis.  The second diagonal, from     the proximal third of the LAD junction, was small, bifurcated, and had no     significant stenosis.  8. The optional diagonal had 40-50% narrowing segmentally in the proximal     third.  9. The circumflex was a dominant vessel.  The first marginal branch was     small. The second marginal branch had 60-70% narrowing, and it was thin.     The distal circumflex was comprised of a PDA and PLA that were normal. 10. The right coronary was a non-dominant vessel.  It ended beyond the acute     margin, and there were thin, distal branches that were predominantly RV.     I do not think that the distal right coronary was totally occluded, but     rather small, and just a non-dominant vessel.  The proximal RCA had 30-40%     narrowing and irregularity.  The mid RCA had 40% beyond the large RV     branch that had a 60-70% proximal lesion. 11. Aortic root angiogram showed no aortic regurgitation, dilatation,     aneurysm, or dissection, and normal origin of the great vessels. 12. The abdominal aortic angiogram revealed single, normal, renal arteries     bilaterally.  There was no significant infrarenal atherosclerotic disease     and a normal iliac bifurcation.  There was good runoff to the distal     iliacs bilaterally, and the SVA profunda junction was intact on the right.  DISCUSSION:  The patients history was well-outlined in his H&P.  He is a nonsmoker, but has an extremely strong family history of coronary disease.  He has a remote  history of atypical chest pain in 1995, and a past  history of abnormal EKG, but negative treadmill exercise test remotely.  He was under the care of Dr. Deloris Ping. Nahser over the last several years.  The patient had a borderline Cardiolite on July 22, 1999 with no evidence of ischemia, and he was continued on medical therapy.  He  suffered an acute anterior wall myocardial infarction while playing "paint ball" on a Sunday with friends and family.  He was brought to [facility].  He was transferred by air ambulance to [facility] after receiving TPA in the emergency room.  He apparently had successful reperfusion clinically and EKG-wise and underwent staged angiography the next day.  He had a subtotal LAD and underwent PTCA and stenting with presumably a Multilink ACS stent.  He had severe LV dysfunction acutely, and followup LV function was abnormal on 2-D echo in the past with an EF of 20% or less.  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  The patient is essentially class III or greater.  He is employed as a Pensions consultant in [place] and is not able to engage in this level of exertion or activity.  He is currently applying for disability, which we support, based on his severe cardiac findings of severe LV function.  He is on maximum medical therapy, which includes aspirin, ACE-inhibitors, Statin therapy, beta-blockers, Lasix, and Aldactone.  He has normal renal function and no other significant associated problems, specifically, no history of diabetes or thyroid disease.  I would recommend continued medical therapy, referral for evaluation in [facility], and consideration for possible, eventual cardiac transplantation based on his clinical symptoms.  He has not had symptoms of arrhythmia, but a Holter monitor is  pending because of his severe LV dysfunction.  At present, we would support his total disability as well.  CATHETERIZATION DIAGNOSES:  1. Atherosclerotic heart disease with acute anterior wall myocardial     infarction on December 13, 1999 treated with rtPA at [facility] Emergency     Room.  2. Successful subsequently percutaneous transluminal coronary angioplasty and     stent ______ on December 14, 1999 with no restenosis on the study.  3. Mild and moderate residual coronary disease as outlined.  4. Severe left ventricular dysfunction with ejection fraction of 20-25%.  5. Normal renal arteries, right internal mammary artery, and left internal     mammary artery.  6. Resting pulmonary hypertension, normal sinus rhythm, severe left     ventricular dysfunction, and elevated pulmonary resistances.  7. Hyperlipidemia.  8. Mild exogenous obesity. DD:  05/18/00 TD:  05/22/00 Job: [id number]

## 2011-05-11 ENCOUNTER — Other Ambulatory Visit: Payer: Self-pay | Admitting: Oncology

## 2011-05-11 ENCOUNTER — Encounter (HOSPITAL_BASED_OUTPATIENT_CLINIC_OR_DEPARTMENT_OTHER): Payer: Medicare Other | Admitting: Oncology

## 2011-05-11 DIAGNOSIS — D696 Thrombocytopenia, unspecified: Secondary | ICD-10-CM

## 2011-05-11 DIAGNOSIS — C61 Malignant neoplasm of prostate: Secondary | ICD-10-CM

## 2011-05-11 DIAGNOSIS — C20 Malignant neoplasm of rectum: Secondary | ICD-10-CM

## 2011-05-11 LAB — CBC WITH DIFFERENTIAL/PLATELET
BASO%: 0.5 % (ref 0.0–2.0)
EOS%: 5.3 % (ref 0.0–7.0)
MCH: 32.9 pg (ref 27.2–33.4)
MCHC: 34.1 g/dL (ref 32.0–36.0)
MCV: 96.5 fL (ref 79.3–98.0)
MONO%: 8.8 % (ref 0.0–14.0)
RBC: 4.12 10*6/uL — ABNORMAL LOW (ref 4.20–5.82)
RDW: 13 % (ref 11.0–14.6)

## 2011-05-11 LAB — CEA: CEA: <0.5 ng/mL (ref 0.0–5.0)

## 2011-06-15 ENCOUNTER — Encounter: Payer: Self-pay | Admitting: Internal Medicine

## 2011-08-02 IMAGING — US US ABDOMEN COMPLETE
1 series · 14 of 25 positions shown · non-contrast
Comparison: CT 05/26/2009

CLINICAL DATA: Chest pain, shortness of breath

COMPLETE ABDOMINAL ULTRASOUND

[Series 1: us abdomen complete · 0.30mm/px · 14 of 43 slices shown]
[im 1/43]
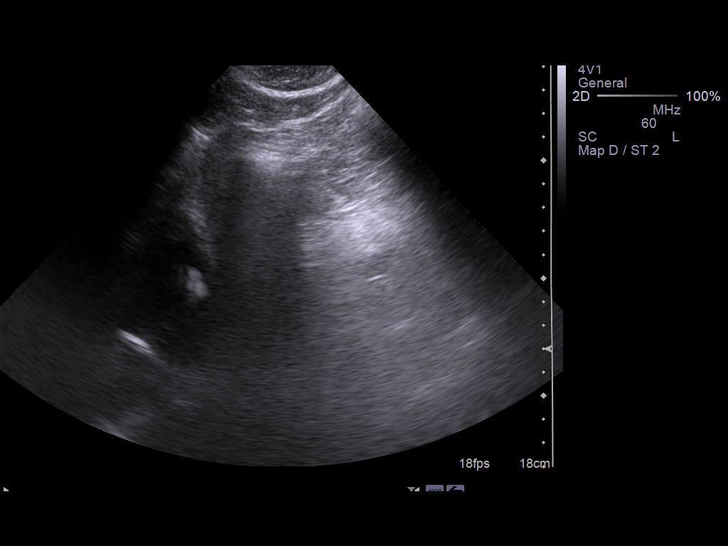
[im 4/43]
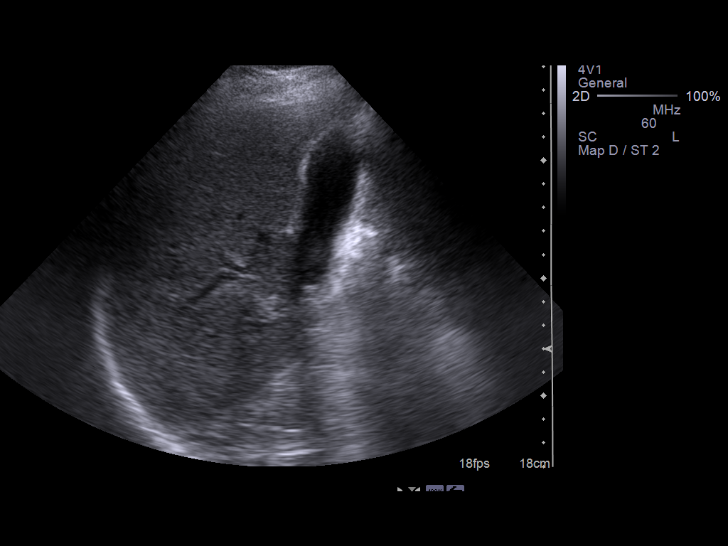
[im 8/43]
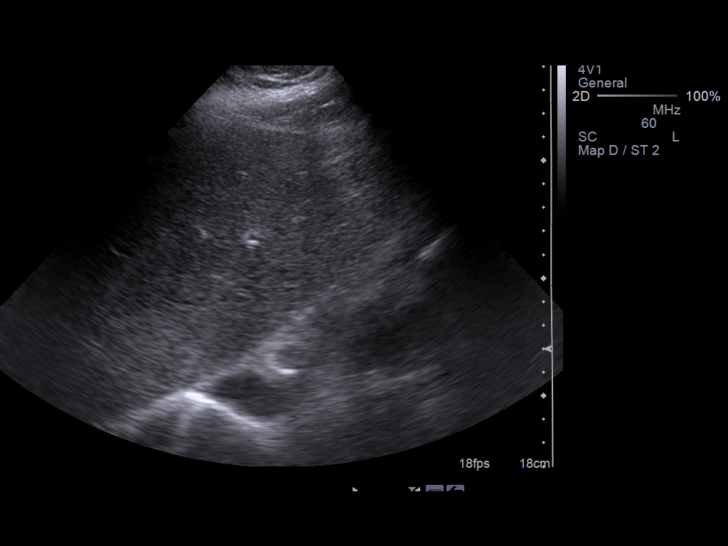
[im 11/43]
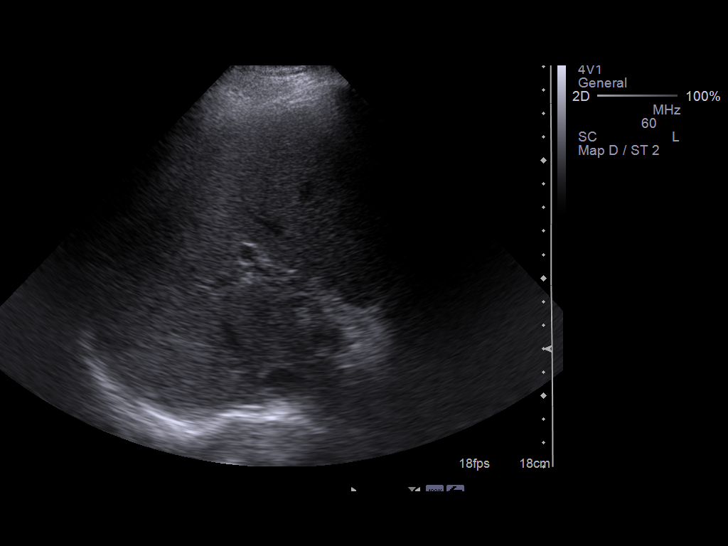
[im 15/43]
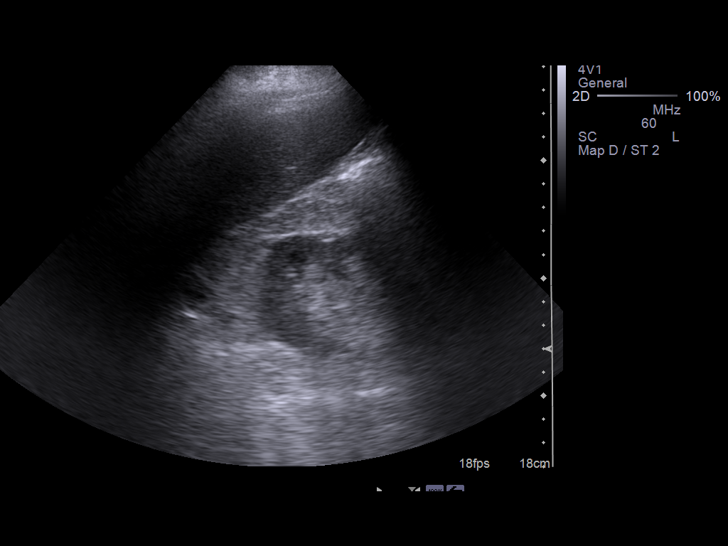
[im 16/43]
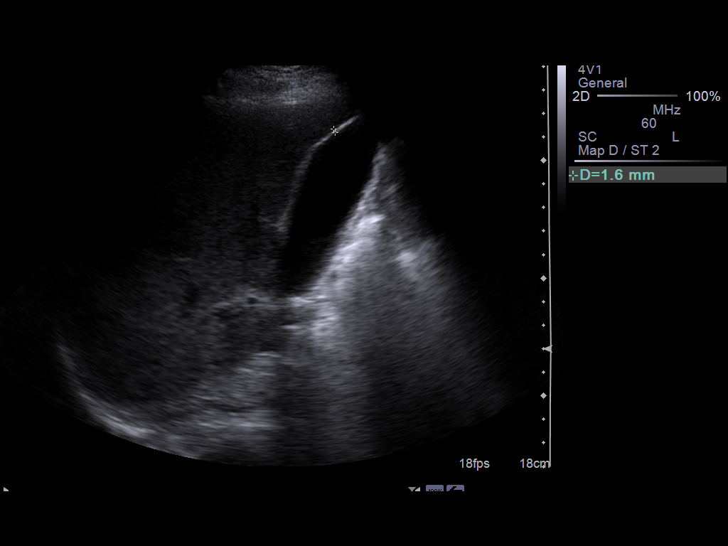
[im 20/43]
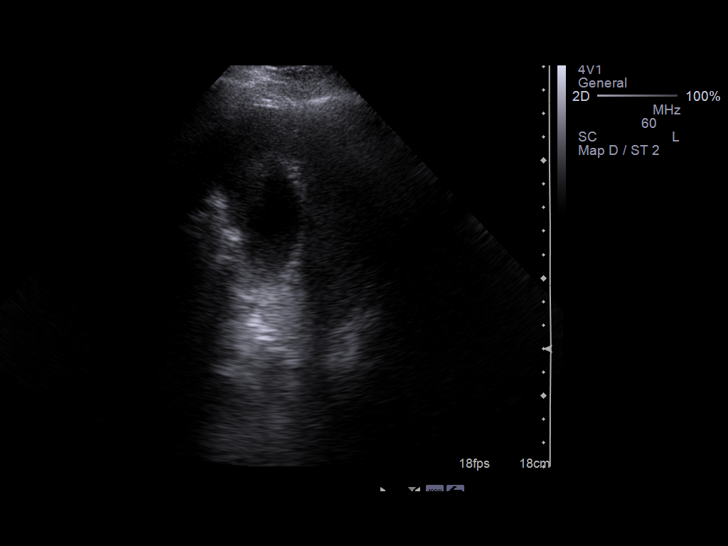
[im 23/43]
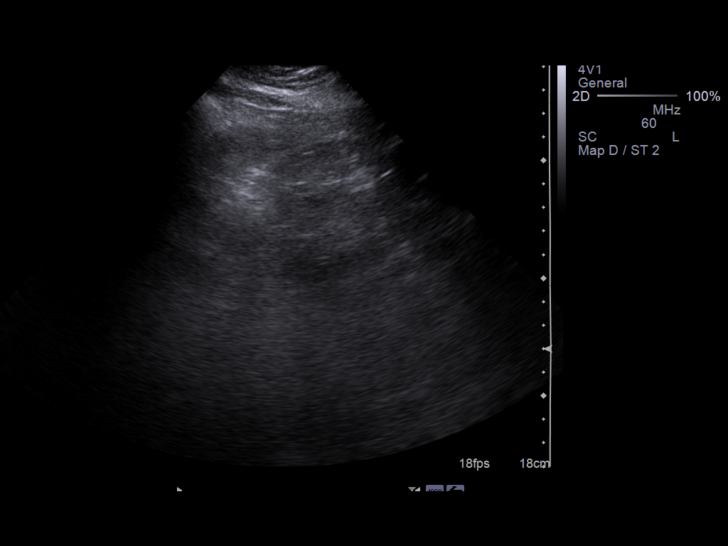
[im 27/43]
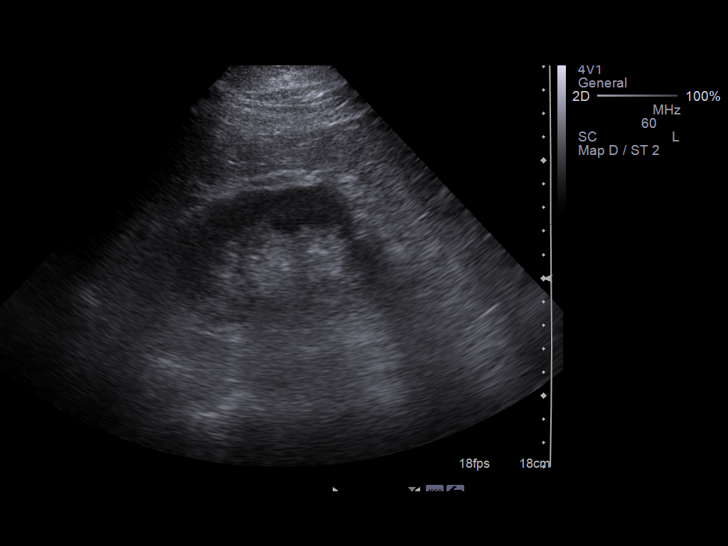
[im 29/43]
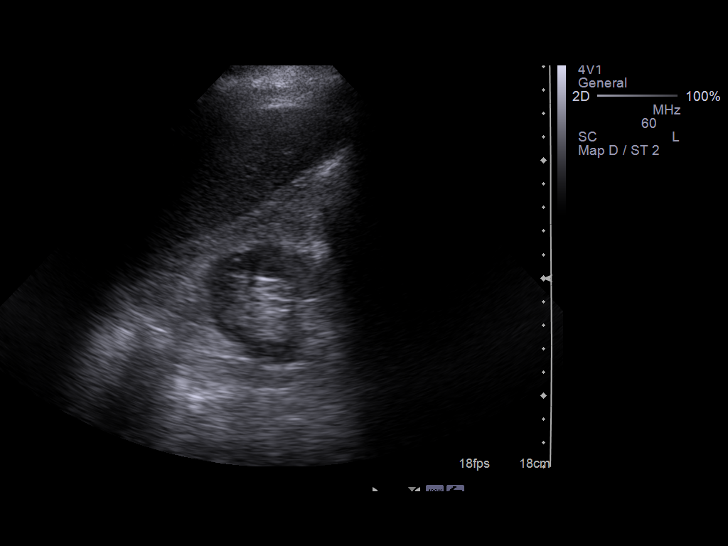
[im 32/43]
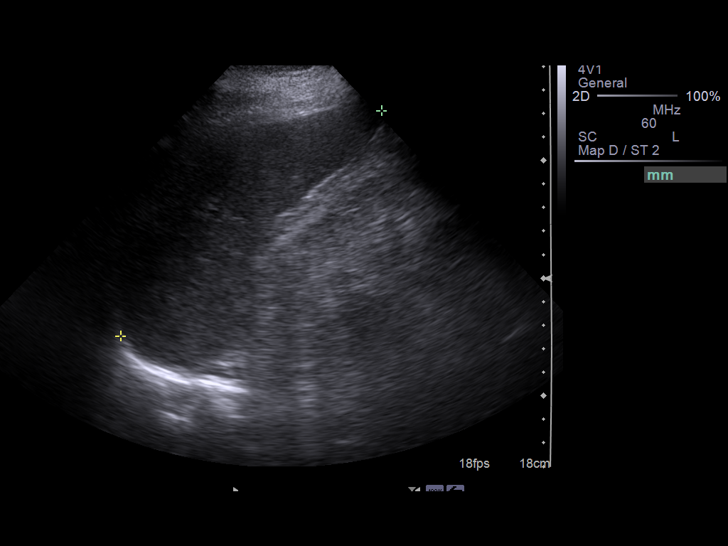
[im 36/43]
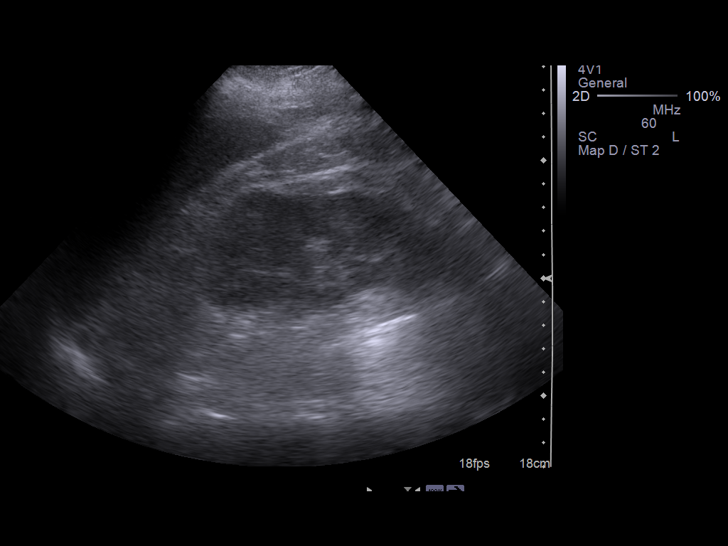
[im 39/43]
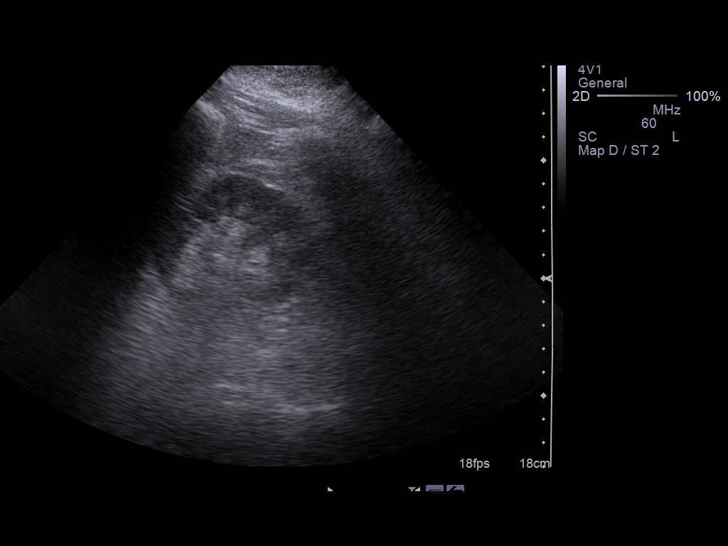
[im 43/43]
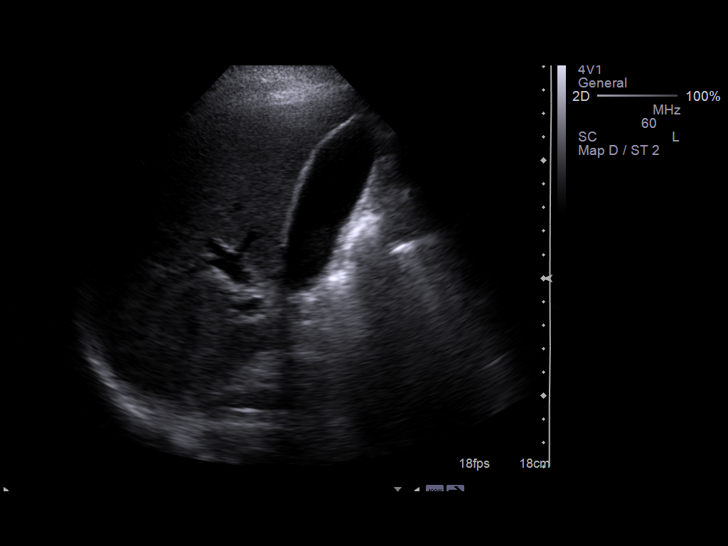

[14 of 25 positions shown; findings below may reference images not displayed]

FINDINGS: Gallbladder:  No gallstones, gallbladder wall thickening, or
pericholecystic fluid.

Common bile duct:  Normal at 6 mm

Liver:  Normal contour echogenicity

IVC:  Poorly visualized secondary to overlying bowel gas.

Pancreas:  Poorly visualized secondary to overlying bowel gas.

Spleen:  Mildly enlarged at 15 cm.

Right Kidney:  11.5cm in length.  No evidence of hydronephrosis or
stones.

Left Kidney:  11.2cm in length.  No evidence of hydronephrosis or
stones.

Abdominal aorta:  No aneurysm identified.
IMPRESSION: 1.  No acute abdominal findings ultrasound.
2.  Normal gallbladder.
3.  Spleen is mildly enlarged at 15 cm in craniocaudad dimension.

## 2011-08-17 ENCOUNTER — Ambulatory Visit (INDEPENDENT_AMBULATORY_CARE_PROVIDER_SITE_OTHER): Payer: Medicare Other | Admitting: Urgent Care

## 2011-08-17 ENCOUNTER — Encounter: Payer: Self-pay | Admitting: Urgent Care

## 2011-08-17 VITALS — BP 99/68 | HR 76 | Temp 97.0°F | Ht 70.0 in | Wt 229.8 lb

## 2011-08-17 DIAGNOSIS — C20 Malignant neoplasm of rectum: Secondary | ICD-10-CM

## 2011-08-17 DIAGNOSIS — E119 Type 2 diabetes mellitus without complications: Secondary | ICD-10-CM

## 2011-08-17 DIAGNOSIS — Z800 Family history of malignant neoplasm of digestive organs: Secondary | ICD-10-CM

## 2011-08-17 NOTE — Assessment & Plan Note (Signed)
Insulin and medication instructions given.

## 2011-08-17 NOTE — Progress Notes (Signed)
Cc to Dr. [name], Awilda Bill, Alanda Amass & Pernell Dupre

## 2011-08-17 NOTE — Assessment & Plan Note (Addendum)
[name] is a 55 y.o. 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 He is due for surveillance colonoscopy via his colostomy.  I have discussed risks & benefits which include, but are not limited to, bleeding, infection, perforation & drug reaction.  The patient agrees with this plan & written consent will be obtained.    Pt has defibrillator.  Endo to be informed.  You will have a colonoscopy via your colostomy Drink trilyetly until stools are clear Hold metformin the day of your prep and of your procedure Take 7 units of NovoLog with breakfast, 6 units with lunch, and 6 units with supper the day before your procedure (day of clear liquids and prep) Take Lantus 12 units the night before your prep and the night before your procedure instead of your usual dose of 24 units. Check your blood sugars frequently and call either your PCP or [place] with any problems Bring all of your medications and insulin to any pain hospital the day of the procedure

## 2011-08-17 NOTE — Assessment & Plan Note (Signed)
Strong family history with both mother and sister with colon cancer as well.

## 2011-08-17 NOTE — Progress Notes (Signed)
Primary Care Physician:  Dr [name] Primary Gastroenterologist:  Dr. Jena Gauss Oncologist: Dr. Lavell Islam Surgeon: Dr. Claud Kelp Cardiologist: Dr. Alanda Amass CHF Clinic: Dr. Devonne Doughty at [facility] Complaint  Patient presents with  . Colonoscopy    HPI:  [name] is a 56 y.o. male with history of adenocarcinoma of the rectum.  He was diagnosed and treated in 2008 with neoadjuvant chemoradiation, APR, and postop adjuvant chemotherapy. His last surveillance colonoscopy through his ostomy was 08/26/2008 this was a normal exam. He is due for surveillance. He does note occasional bright red blood from stoma.  Wt stable.  Appetite ok.   Denies any upper GI symptoms including heartburn, indigestion, nausea, vomiting, dysphagia, odynophagia or anorexia. He is on Coumadin. 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  Past Medical History  Diagnosis Date  . Acute myocardial infarction, unspecified site, episode of care unspecified   . Unspecified essential hypertension   . Type II or unspecified type diabetes mellitus without mention of complication, not stated as uncontrolled   . 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 Prostate cancer   . CAD (coronary artery disease)   . S/P colonoscopy 08/26/2008    Normal via ostomy  . CHF (congestive heart failure)    Past Surgical History  Procedure Date  . Internal defibrillator and pacemaker 2010    x2  . Abdominal and perineal resection of rectum with total mesorectal excision     10/04/2007  . Colonoscopy     05/17/2007. IMPRESSION: Semilunar, apple-core neoplasm low in the rectum (palpable on digital rectal exam) beginning at 5 cm and corkscrewing up 5 cm in length. This was a low rectal lesion consistent with colorectal carcinoma. It was biosied multiple times. The upstream colon all the way to the cecum appeared normal. Recommendations:  Followup on path. Surgical Consultation     Current Outpatient Prescriptions  Medication Sig Dispense Refill  . amiodarone (PACERONE) 200 MG tablet Take 200 mg by mouth 2 (two) times daily.       Marland Kitchen aspirin 81 MG EC tablet Take 81 mg by mouth daily.        . benazepril (LOTENSIN) 40 MG tablet Take 20 mg by mouth 2 (two) times daily.        . citalopram (CELEXA) 10 MG tablet Take 1 tablet by mouth Daily.      Marland Kitchen co-enzyme Q-10 50 MG capsule Take 50 mg by mouth daily.        Marland Kitchen COUMADIN 2.5 MG tablet Take 2.5 mg by mouth daily.       . CRESTOR 20 MG tablet Take 20 mg by mouth daily.       . digoxin (LANOXIN) 0.125 MG tablet Take 125 mcg by mouth daily.        . enalapril (VASOTEC) 10 MG tablet Take 1 tablet by mouth Daily.      . Fish Oil OIL 2 tablets daily.        . furosemide (LASIX) 20 MG tablet Take 20 mg by mouth 2 (two) times daily.        . insulin aspart (NOVOLOG) 100 UNIT/ML injection Inject 14 Units into the skin 3 (three) times daily before meals. 14 units at breakfast and 12 units a lunch and 12 units at supper        . insulin glargine (LANTUS) 100 UNIT/ML injection Inject 24 Units into  the skin at bedtime.        . isosorbide mononitrate (IMDUR) 30 MG 24 hr tablet Take 15 mg by mouth 2 (two) times daily.       . metFORMIN (GLUCOPHAGE) 500 MG tablet Take 500 mg by mouth daily.        . metoprolol (TOPROL-XL) 200 MG 24 hr tablet Take 100 mg by mouth 2 (two) times daily.        . niacin (NIASPAN) 1000 MG CR tablet Take 1,000 mg by mouth at bedtime.        . nitroGLYCERIN (NITROSTAT) 0.4 MG SL tablet Place 0.4 mg under the tongue every 5 (five) minutes as needed.        . pantoprazole (PROTONIX) 40 MG tablet Take 1 tablet by mouth Daily.      Marland Kitchen RANEXA 500 MG 12 hr tablet Take 1 tablet by mouth Twice daily.      Marland Kitchen spironolactone (ALDACTONE) 25 MG tablet Take 25 mg by mouth daily.        . vitamin C (ASCORBIC ACID) 500 MG tablet Take 500 mg by mouth daily.         Allergies as of  08/17/2011  . (No Known Allergies)   Family History  Problem Relation Age of Onset  . Colon cancer Mother 15  . Colon cancer Sister 8  . Coronary artery disease Father     History   Social History  . Marital Status: Married    Spouse Name: N/A    Number of Children: 1  . Years of Education: N/A   Occupational History  .     Social History Main Topics  . Smoking status: Never Smoker   . Smokeless tobacco: Not on file  . Alcohol Use: No     former user  . Drug Use: No  . Sexually Active: Not on file   Other Topics Concern  . Not on file   Social History Narrative  . No narrative on file    Review of Systems: Gen: Denies any fever, chills, sweats, anorexia, fatigue, weakness, malaise, weight loss, and sleep disorder CV: Denies chest pain, angina, palpitations, syncope, orthopnea, PND, peripheral edema, and claudication. Resp: Denies dyspnea at rest, dyspnea with exercise, cough, sputum, wheezing, coughing up blood, and pleurisy. GI: Denies vomiting blood, jaundice, and fecal incontinence.   Denies dysphagia or odynophagia. GU : Denies urinary burning, blood in urine, urinary frequency, urinary hesitancy, nocturnal urination, and urinary incontinence. MS: Denies joint pain, limitation of movement, and swelling, stiffness, low back pain, extremity pain. Denies muscle weakness, cramps, atrophy.  Derm: Denies rash, itching, dry skin, hives, moles, warts, or unhealing ulcers.  Psych: Denies depression, anxiety, memory loss, suicidal ideation, hallucinations, paranoia, and confusion. Heme: Denies bruising and enlarged lymph nodes.  Physical Exam: BP 99/68  Pulse 76  Temp(Src) 97 F (36.1 C) (Temporal)  Ht 5\' 10"  (1.778 m)  Wt 229 lb 12.8 oz (104.237 kg)  BMI 32.97 kg/m2 General:   Alert,  Well-developed, well-nourished, pleasant and cooperative in NAD Head:  Normocephalic and atraumatic. Eyes:  Sclera clear, no icterus.   Conjunctiva pink. Ears:  Normal auditory  acuity. Nose:  No deformity, discharge,  or lesions. Mouth:  No deformity or lesions, oropharynx pink and moist. Neck:  Supple; no masses or thyromegaly. Lungs:  Clear throughout to auscultation.   No wheezes, crackles, or rhonchi. No acute distress. Heart:  Regular rate and rhythm; no murmurs, clicks, rubs,  or gallops. Pacemaker  in place. Abdomen:  Soft, nontender and nondistended. Beefy red stoma with friable margins. Colostomy bag in place with medium brown stool. No masses, hepatosplenomegaly or hernias noted. Normal bowel sounds, without guarding, and without rebound.   Rectal:  Deferred. Msk:  Symmetrical without gross deformities. Normal posture. Pulses:  Normal pulses noted. Extremities:  Without clubbing or edema. Neurologic:  Alert and  oriented x4;  grossly normal neurologically. Skin:  Intact without significant lesions or rashes. Cervical Nodes:  No significant cervical adenopathy. Psych:  Alert and cooperative. Normal mood and affect.

## 2011-08-17 NOTE — Patient Instructions (Signed)
You will have a colonoscopy via your colostomy Drink trilyetly until stools are clear Hold metformin the day of your prep and of your procedure Take 7 units of NovoLog with breakfast, 6 units with lunch, and 6 units with supper the day before your procedure (day of clear liquids and prep) Take Lantus 12 units the night before your prep and the night before your procedure instead of your usual dose of 24 units. Check your blood sugars frequently and call either your PCP or [place] with any problems Bring all of your medications and insulin to any pain hospital the day of the procedure

## 2011-09-01 LAB — POCT CARDIAC MARKERS
CKMB, poc: 1.2
CKMB, poc: <1 — ABNORMAL LOW
Operator id: 272551
Operator id: 277751
Operator id: 277751
Troponin i, poc: <0.05
Troponin i, poc: <0.05

## 2011-09-01 LAB — POCT I-STAT, CHEM 8
BUN: 18
Calcium, Ion: 1.22
Glucose, Bld: 392 — ABNORMAL HIGH
HCT: 37 — ABNORMAL LOW
TCO2: 21

## 2011-09-01 LAB — DIGOXIN LEVEL: Digoxin Level: <0.2 — ABNORMAL LOW

## 2011-09-06 LAB — PROTIME-INR
INR: 1.3
INR: 1.8 — ABNORMAL HIGH
INR: 2.5 — ABNORMAL HIGH
INR: 2.9 — ABNORMAL HIGH
Prothrombin Time: 12.5
Prothrombin Time: 22.2 — ABNORMAL HIGH
Prothrombin Time: 28.6 — ABNORMAL HIGH

## 2011-09-06 LAB — COMPREHENSIVE METABOLIC PANEL
AST: 18
AST: 22
Albumin: 3.4 — ABNORMAL LOW
Albumin: 3.4 — ABNORMAL LOW
Albumin: 3.5
Alkaline Phosphatase: 94
BUN: 18
CO2: 22
Calcium: 9.3
Chloride: 101
Chloride: 104
Chloride: 99
Creatinine, Ser: 0.99
Creatinine, Ser: 1.15
Creatinine, Ser: 1.16
GFR calc Af Amer: >60
GFR calc Af Amer: >60
GFR calc non Af Amer: >60
Glucose, Bld: 263 — ABNORMAL HIGH
Potassium: 4.1
Total Bilirubin: 0.7
Total Bilirubin: 0.8
Total Protein: 6.1
Total Protein: 6.2

## 2011-09-06 LAB — POCT I-STAT, CHEM 8
BUN: 18
Chloride: 104
Creatinine, Ser: 1.1
Potassium: 3.7
Sodium: 135
TCO2: 24

## 2011-09-06 LAB — POCT CARDIAC MARKERS
CKMB, poc: <1 — ABNORMAL LOW
CKMB, poc: <1 — ABNORMAL LOW
CKMB, poc: <1 — ABNORMAL LOW
Myoglobin, poc: 41.4
Myoglobin, poc: 77.5
Troponin i, poc: <0.05
Troponin i, poc: <0.05

## 2011-09-06 LAB — CBC
HCT: 37.8 — ABNORMAL LOW
HCT: 38.1 — ABNORMAL LOW
HCT: 40.3
HCT: 40.6
Hemoglobin: 12.9 — ABNORMAL LOW
Hemoglobin: 13.1
Hemoglobin: 13.4
Hemoglobin: 13.7
MCHC: 34
MCHC: 34.6
MCHC: 34.7
MCV: 94.6
MCV: 95
MCV: 96.4
Platelets: 169
Platelets: 175
Platelets: 181
Platelets: 196
Platelets: 204
RBC: 4.04 — ABNORMAL LOW
RBC: 4.07 — ABNORMAL LOW
RDW: 12.9
RDW: 12.9
RDW: 12.9
RDW: 12.9
RDW: 13
RDW: 13.1
WBC: 6.4
WBC: 6.7
WBC: 8.1
WBC: 8.2

## 2011-09-06 LAB — BASIC METABOLIC PANEL
BUN: 16
BUN: 28 — ABNORMAL HIGH
CO2: 24
CO2: 26
Calcium: 9.5
Calcium: 9.7
Chloride: 99
Creatinine, Ser: 1.07
Creatinine, Ser: 1.08
GFR calc Af Amer: >60
GFR calc Af Amer: >60
GFR calc Af Amer: >60
GFR calc non Af Amer: >60
GFR calc non Af Amer: >60
GFR calc non Af Amer: >60
GFR calc non Af Amer: >60
Glucose, Bld: 175 — ABNORMAL HIGH
Glucose, Bld: 198 — ABNORMAL HIGH
Glucose, Bld: 234 — ABNORMAL HIGH
Glucose, Bld: 272 — ABNORMAL HIGH
Potassium: 3.4 — ABNORMAL LOW
Potassium: 3.7
Potassium: 3.9
Potassium: 4
Sodium: 134 — ABNORMAL LOW
Sodium: 135
Sodium: 136
Sodium: 136
Sodium: 138

## 2011-09-06 LAB — LIPID PANEL
Cholesterol: 138
HDL: 25 — ABNORMAL LOW
LDL Cholesterol: 44
LDL Cholesterol: 76
Total CHOL/HDL Ratio: 5.8
Triglycerides: 183 — ABNORMAL HIGH
Triglycerides: 336 — ABNORMAL HIGH
VLDL: 67 — ABNORMAL HIGH

## 2011-09-06 LAB — GLUCOSE, CAPILLARY
Glucose-Capillary: 106 — ABNORMAL HIGH
Glucose-Capillary: 119 — ABNORMAL HIGH
Glucose-Capillary: 122 — ABNORMAL HIGH
Glucose-Capillary: 131 — ABNORMAL HIGH
Glucose-Capillary: 140 — ABNORMAL HIGH
Glucose-Capillary: 143 — ABNORMAL HIGH
Glucose-Capillary: 144 — ABNORMAL HIGH
Glucose-Capillary: 161 — ABNORMAL HIGH
Glucose-Capillary: 175 — ABNORMAL HIGH
Glucose-Capillary: 212 — ABNORMAL HIGH
Glucose-Capillary: 216 — ABNORMAL HIGH
Glucose-Capillary: 230 — ABNORMAL HIGH
Glucose-Capillary: 234 — ABNORMAL HIGH
Glucose-Capillary: 234 — ABNORMAL HIGH
Glucose-Capillary: 243 — ABNORMAL HIGH
Glucose-Capillary: 245 — ABNORMAL HIGH
Glucose-Capillary: 249 — ABNORMAL HIGH
Glucose-Capillary: 254 — ABNORMAL HIGH
Glucose-Capillary: 268 — ABNORMAL HIGH
Glucose-Capillary: 270 — ABNORMAL HIGH
Glucose-Capillary: 286 — ABNORMAL HIGH
Glucose-Capillary: 292 — ABNORMAL HIGH
Glucose-Capillary: 300 — ABNORMAL HIGH
Glucose-Capillary: 304 — ABNORMAL HIGH
Glucose-Capillary: 364 — ABNORMAL HIGH
Glucose-Capillary: 89
Glucose-Capillary: 95

## 2011-09-06 LAB — CARDIAC PANEL(CRET KIN+CKTOT+MB+TROPI)
CK, MB: 2.6
CK, MB: 3.2
CK, MB: 3.6
Relative Index: INVALID
Troponin I: 0.11 — ABNORMAL HIGH

## 2011-09-06 LAB — DIFFERENTIAL
Basophils Absolute: 0
Basophils Absolute: 0
Basophils Absolute: 0
Basophils Absolute: 0
Basophils Relative: 0
Eosinophils Absolute: 0.2
Eosinophils Absolute: 0.3
Eosinophils Relative: 3
Eosinophils Relative: 3
Eosinophils Relative: 4
Eosinophils Relative: 5
Lymphocytes Relative: 11 — ABNORMAL LOW
Lymphocytes Relative: 14
Lymphs Abs: 0.9
Lymphs Abs: 0.9
Lymphs Abs: 1.3
Monocytes Absolute: 0.6
Monocytes Absolute: 0.8
Monocytes Relative: 11
Neutro Abs: 4.7
Neutro Abs: 6.4

## 2011-09-06 LAB — B-NATRIURETIC PEPTIDE (CONVERTED LAB): Pro B Natriuretic peptide (BNP): 195 — ABNORMAL HIGH

## 2011-09-06 LAB — CK TOTAL AND CKMB (NOT AT ARMC): CK, MB: 1.2

## 2011-09-06 LAB — TSH
TSH: 2.316
TSH: 2.473

## 2011-09-06 LAB — TROPONIN I: Troponin I: 0.01

## 2011-09-07 LAB — BASIC METABOLIC PANEL
Chloride: 103
GFR calc non Af Amer: >60
Potassium: 4.9
Sodium: 135

## 2011-09-07 LAB — PROTIME-INR: Prothrombin Time: 26.9 — ABNORMAL HIGH

## 2011-09-07 LAB — CK TOTAL AND CKMB (NOT AT ARMC)
CK, MB: 1
Relative Index: INVALID
Total CK: 35

## 2011-09-07 LAB — DIFFERENTIAL
Eosinophils Absolute: 0.2
Eosinophils Relative: 3
Lymphocytes Relative: 12
Lymphs Abs: 0.9
Monocytes Absolute: 0.5
Monocytes Relative: 7

## 2011-09-07 LAB — POCT CARDIAC MARKERS: Myoglobin, poc: 43.6

## 2011-09-07 LAB — CBC
HCT: 36.5 — ABNORMAL LOW
HCT: 37 — ABNORMAL LOW
Hemoglobin: 12.4 — ABNORMAL LOW
MCV: 95
Platelets: 124 — ABNORMAL LOW
RBC: 3.89 — ABNORMAL LOW
RDW: 13.2
WBC: 7.3

## 2011-09-07 LAB — GLUCOSE, CAPILLARY
Glucose-Capillary: 130 — ABNORMAL HIGH
Glucose-Capillary: 132 — ABNORMAL HIGH
Glucose-Capillary: 240 — ABNORMAL HIGH

## 2011-09-07 LAB — TROPONIN I: Troponin I: 0.02

## 2011-09-07 LAB — CARDIAC PANEL(CRET KIN+CKTOT+MB+TROPI): CK, MB: 1

## 2011-09-09 LAB — GLUCOSE, CAPILLARY
Glucose-Capillary: 112 mg/dL — ABNORMAL HIGH (ref 70–99)
Glucose-Capillary: 130 mg/dL — ABNORMAL HIGH (ref 70–99)
Glucose-Capillary: 141 mg/dL — ABNORMAL HIGH (ref 70–99)
Glucose-Capillary: 145 mg/dL — ABNORMAL HIGH (ref 70–99)
Glucose-Capillary: 149 mg/dL — ABNORMAL HIGH (ref 70–99)
Glucose-Capillary: 149 mg/dL — ABNORMAL HIGH (ref 70–99)
Glucose-Capillary: 152 mg/dL — ABNORMAL HIGH (ref 70–99)
Glucose-Capillary: 161 mg/dL — ABNORMAL HIGH (ref 70–99)
Glucose-Capillary: 162 mg/dL — ABNORMAL HIGH (ref 70–99)
Glucose-Capillary: 168 mg/dL — ABNORMAL HIGH (ref 70–99)
Glucose-Capillary: 169 mg/dL — ABNORMAL HIGH (ref 70–99)
Glucose-Capillary: 173 mg/dL — ABNORMAL HIGH (ref 70–99)
Glucose-Capillary: 179 mg/dL — ABNORMAL HIGH (ref 70–99)
Glucose-Capillary: 180 mg/dL — ABNORMAL HIGH (ref 70–99)
Glucose-Capillary: 186 mg/dL — ABNORMAL HIGH (ref 70–99)
Glucose-Capillary: 188 mg/dL — ABNORMAL HIGH (ref 70–99)
Glucose-Capillary: 188 mg/dL — ABNORMAL HIGH (ref 70–99)
Glucose-Capillary: 216 mg/dL — ABNORMAL HIGH (ref 70–99)
Glucose-Capillary: 273 mg/dL — ABNORMAL HIGH (ref 70–99)

## 2011-09-09 LAB — BASIC METABOLIC PANEL
BUN: 15 mg/dL (ref 6–23)
BUN: 17 mg/dL (ref 6–23)
BUN: 17 mg/dL (ref 6–23)
CO2: 25 mEq/L (ref 19–32)
CO2: 25 mEq/L (ref 19–32)
CO2: 26 mEq/L (ref 19–32)
CO2: 29 mEq/L (ref 19–32)
Calcium: 9 mg/dL (ref 8.4–10.5)
Calcium: 9.2 mg/dL (ref 8.4–10.5)
Calcium: 9.7 mg/dL (ref 8.4–10.5)
Calcium: 9.8 mg/dL (ref 8.4–10.5)
Chloride: 102 mEq/L (ref 96–112)
Chloride: 104 mEq/L (ref 96–112)
Chloride: 108 mEq/L (ref 96–112)
Chloride: 109 mEq/L (ref 96–112)
Chloride: 109 mEq/L (ref 96–112)
Chloride: 111 mEq/L (ref 96–112)
Creatinine, Ser: 1.08 mg/dL (ref 0.4–1.5)
Creatinine, Ser: 1.13 mg/dL (ref 0.4–1.5)
Creatinine, Ser: 1.16 mg/dL (ref 0.4–1.5)
Creatinine, Ser: 1.22 mg/dL (ref 0.4–1.5)
Creatinine, Ser: 1.26 mg/dL (ref 0.4–1.5)
Creatinine, Ser: 1.31 mg/dL (ref 0.4–1.5)
GFR calc Af Amer: >60 mL/min (ref >60)
GFR calc Af Amer: >60 mL/min (ref >60)
GFR calc Af Amer: >60 mL/min (ref >60)
GFR calc Af Amer: >60 mL/min (ref >60)
GFR calc non Af Amer: 57 mL/min — ABNORMAL LOW (ref >60)
Glucose, Bld: 145 mg/dL — ABNORMAL HIGH (ref 70–99)
Glucose, Bld: 149 mg/dL — ABNORMAL HIGH (ref 70–99)
Glucose, Bld: 160 mg/dL — ABNORMAL HIGH (ref 70–99)
Potassium: 3.8 mEq/L (ref 3.5–5.1)
Potassium: 4.6 mEq/L (ref 3.5–5.1)
Sodium: 138 mEq/L (ref 135–145)
Sodium: 140 mEq/L (ref 135–145)

## 2011-09-09 LAB — COMPREHENSIVE METABOLIC PANEL
Alkaline Phosphatase: 98 U/L (ref 39–117)
BUN: 19 mg/dL (ref 6–23)
Calcium: 9.7 mg/dL (ref 8.4–10.5)
Creatinine, Ser: 1.3 mg/dL (ref 0.4–1.5)
Glucose, Bld: 162 mg/dL — ABNORMAL HIGH (ref 70–99)
Total Protein: 7 g/dL (ref 6.0–8.3)

## 2011-09-09 LAB — URINALYSIS, ROUTINE W REFLEX MICROSCOPIC
Bilirubin Urine: NEGATIVE
Nitrite: NEGATIVE
Specific Gravity, Urine: 1.023 (ref 1.005–1.030)
Urobilinogen, UA: 1 mg/dL (ref 0.0–1.0)

## 2011-09-09 LAB — POCT I-STAT, CHEM 8
BUN: 21 mg/dL (ref 6–23)
Calcium, Ion: 1.05 mmol/L — ABNORMAL LOW (ref 1.12–1.32)
Calcium, Ion: 1.14 mmol/L (ref 1.12–1.32)
Chloride: 106 mEq/L (ref 96–112)
Chloride: 107 mEq/L (ref 96–112)
Creatinine, Ser: 1.3 mg/dL (ref 0.4–1.5)
Creatinine, Ser: 1.3 mg/dL (ref 0.4–1.5)
Glucose, Bld: 145 mg/dL — ABNORMAL HIGH (ref 70–99)
Glucose, Bld: 178 mg/dL — ABNORMAL HIGH (ref 70–99)
HCT: 38 % — ABNORMAL LOW (ref 39.0–52.0)
HCT: 38 % — ABNORMAL LOW (ref 39.0–52.0)
Hemoglobin: 12.9 g/dL — ABNORMAL LOW (ref 13.0–17.0)
Hemoglobin: 12.9 g/dL — ABNORMAL LOW (ref 13.0–17.0)
Potassium: 3.7 mEq/L (ref 3.5–5.1)
Potassium: 3.9 mEq/L (ref 3.5–5.1)
Potassium: 4 mEq/L (ref 3.5–5.1)
Sodium: 141 mEq/L (ref 135–145)
Sodium: 142 mEq/L (ref 135–145)
TCO2: 25 mmol/L (ref 0–100)

## 2011-09-09 LAB — CARDIAC PANEL(CRET KIN+CKTOT+MB+TROPI)
CK, MB: 1 ng/mL (ref 0.3–4.0)
CK, MB: 1 ng/mL (ref 0.3–4.0)
Relative Index: INVALID (ref 0.0–2.5)
Relative Index: INVALID (ref 0.0–2.5)
Total CK: 35 U/L (ref 7–232)
Total CK: 36 U/L (ref 7–232)
Troponin I: 0.01 ng/mL (ref 0.00–0.06)
Troponin I: 0.02 ng/mL (ref 0.00–0.06)

## 2011-09-09 LAB — CBC
HCT: 35.4 % — ABNORMAL LOW (ref 39.0–52.0)
HCT: 38.9 % — ABNORMAL LOW (ref 39.0–52.0)
HCT: 39.2 % (ref 39.0–52.0)
Hemoglobin: 13.1 g/dL (ref 13.0–17.0)
MCHC: 33 g/dL (ref 30.0–36.0)
MCHC: 33.2 g/dL (ref 30.0–36.0)
MCHC: 33.4 g/dL (ref 30.0–36.0)
MCHC: 33.6 g/dL (ref 30.0–36.0)
MCHC: 33.8 g/dL (ref 30.0–36.0)
MCV: 94.3 fL (ref 78.0–100.0)
MCV: 95.2 fL (ref 78.0–100.0)
MCV: 95.4 fL (ref 78.0–100.0)
MCV: 95.7 fL (ref 78.0–100.0)
Platelets: 122 10*3/uL — ABNORMAL LOW (ref 150–400)
Platelets: 159 10*3/uL (ref 150–400)
RBC: 3.75 MIL/uL — ABNORMAL LOW (ref 4.22–5.81)
RBC: 4.1 MIL/uL — ABNORMAL LOW (ref 4.22–5.81)
RDW: 13.7 % (ref 11.5–15.5)
WBC: 5.7 10*3/uL (ref 4.0–10.5)
WBC: 6.1 10*3/uL (ref 4.0–10.5)

## 2011-09-09 LAB — LIPID PANEL
LDL Cholesterol: 55 mg/dL (ref 0–99)
Total CHOL/HDL Ratio: 4.2 RATIO
VLDL: 28 mg/dL (ref 0–40)

## 2011-09-09 LAB — HEMOGLOBIN A1C
Hgb A1c MFr Bld: 7.4 % — ABNORMAL HIGH (ref 4.6–6.1)
Hgb A1c MFr Bld: 8 % — ABNORMAL HIGH (ref 4.6–6.1)
Mean Plasma Glucose: 183 mg/dL

## 2011-09-09 LAB — PROTIME-INR
INR: 1.1 (ref 0.00–1.49)
INR: 1.1 (ref 0.00–1.49)
INR: 1.8 — ABNORMAL HIGH (ref 0.00–1.49)
INR: 2 — ABNORMAL HIGH (ref 0.00–1.49)
INR: 2 — ABNORMAL HIGH (ref 0.00–1.49)
INR: 2.4 — ABNORMAL HIGH (ref 0.00–1.49)
INR: 3.2 — ABNORMAL HIGH (ref 0.00–1.49)
Prothrombin Time: 14.3 seconds (ref 11.6–15.2)
Prothrombin Time: 14.9 seconds (ref 11.6–15.2)
Prothrombin Time: 16.4 seconds — ABNORMAL HIGH (ref 11.6–15.2)
Prothrombin Time: 22 seconds — ABNORMAL HIGH (ref 11.6–15.2)
Prothrombin Time: 22.5 seconds — ABNORMAL HIGH (ref 11.6–15.2)
Prothrombin Time: 23.4 seconds — ABNORMAL HIGH (ref 11.6–15.2)
Prothrombin Time: 27.5 seconds — ABNORMAL HIGH (ref 11.6–15.2)
Prothrombin Time: 35.2 seconds — ABNORMAL HIGH (ref 11.6–15.2)

## 2011-09-09 LAB — DIFFERENTIAL
Basophils Relative: 0 % (ref 0–1)
Eosinophils Absolute: 0.2 10*3/uL (ref 0.0–0.7)
Eosinophils Relative: 3 % (ref 0–5)
Lymphocytes Relative: 17 % (ref 12–46)
Lymphs Abs: 0.9 10*3/uL (ref 0.7–4.0)
Monocytes Absolute: 0.7 10*3/uL (ref 0.1–1.0)
Monocytes Relative: 10 % (ref 3–12)
Monocytes Relative: 10 % (ref 3–12)
Neutro Abs: 4.6 10*3/uL (ref 1.7–7.7)

## 2011-09-09 LAB — POCT CARDIAC MARKERS
CKMB, poc: <1 ng/mL — ABNORMAL LOW (ref 1.0–8.0)
CKMB, poc: <1 ng/mL — ABNORMAL LOW (ref 1.0–8.0)
Myoglobin, poc: 45.3 ng/mL (ref 12–200)
Myoglobin, poc: 59.4 ng/mL (ref 12–200)
Troponin i, poc: <0.05 ng/mL (ref 0.00–0.09)
Troponin i, poc: <0.05 ng/mL (ref 0.00–0.09)

## 2011-09-09 LAB — TSH: TSH: 1.569 u[IU]/mL (ref 0.350–4.500)

## 2011-09-09 LAB — CK TOTAL AND CKMB (NOT AT ARMC)
CK, MB: 1.1 ng/mL (ref 0.3–4.0)
CK, MB: 1.1 ng/mL (ref 0.3–4.0)
CK, MB: 1.2 ng/mL (ref 0.3–4.0)
Relative Index: INVALID (ref 0.0–2.5)
Relative Index: INVALID (ref 0.0–2.5)
Relative Index: INVALID (ref 0.0–2.5)
Relative Index: INVALID (ref 0.0–2.5)
Total CK: 41 U/L (ref 7–232)
Total CK: 46 U/L (ref 7–232)

## 2011-09-09 LAB — MAGNESIUM
Magnesium: 1.9 mg/dL (ref 1.5–2.5)
Magnesium: 2.1 mg/dL (ref 1.5–2.5)

## 2011-09-09 LAB — APTT: aPTT: 31 seconds (ref 24–37)

## 2011-09-09 LAB — PLATELET COUNT: Platelets: 158 10*3/uL (ref 150–400)

## 2011-09-13 ENCOUNTER — Telehealth: Payer: Self-pay

## 2011-09-13 MED ORDER — SODIUM CHLORIDE 0.45 % IV SOLN
Freq: Once | INTRAVENOUS | Status: AC
  Administered 2011-09-14: 09:00:00 via INTRAVENOUS

## 2011-09-13 NOTE — Progress Notes (Signed)
Pt procedure to be done on coumadin.  Discussed w/ RMR. We did discuss the fact that he is at a slightly higher risk of GI bleeding given the fact that he is on Coumadin, however the benefits of remaining on the Coumadin outweigh the risk of bleeding. We discussed the life threatening nature of an embolic event. He agrees to remain on Coumadin for this procedure. He also understands that a second procedure may be required since he is on Coumadin if he shows signs of significant bleeding or is in need of significant intervention that cannot be performed while on Coumadin.  He agrees with all the above and consent will be obtained.

## 2011-09-13 NOTE — Telephone Encounter (Signed)
I called pt & discussed.

## 2011-09-13 NOTE — Telephone Encounter (Signed)
Pt called- he is scheduled for a tcs tomorrow. Pt is on Coumadin. He was not told to stop taking it and pt wants to know if he should take it today or not. Pt is taking 2.5mg  daily at 5pm. Please advise.

## 2011-09-14 ENCOUNTER — Encounter (HOSPITAL_COMMUNITY): Payer: Self-pay | Admitting: *Deleted

## 2011-09-14 ENCOUNTER — Other Ambulatory Visit: Payer: Self-pay | Admitting: Internal Medicine

## 2011-09-14 ENCOUNTER — Ambulatory Visit (HOSPITAL_COMMUNITY)
Admission: RE | Admit: 2011-09-14 | Discharge: 2011-09-14 | Disposition: A | Discharge status: Home / Self Care | Payer: Medicare Other | Source: Ambulatory Visit | Attending: Internal Medicine | Admitting: Internal Medicine

## 2011-09-14 ENCOUNTER — Encounter (HOSPITAL_COMMUNITY)
Admission: RE | Disposition: A | Discharge status: Home / Self Care | Payer: Self-pay | Source: Ambulatory Visit | Attending: Internal Medicine

## 2011-09-14 DIAGNOSIS — Z9089 Acquired absence of other organs: Secondary | ICD-10-CM | POA: Insufficient documentation

## 2011-09-14 DIAGNOSIS — E119 Type 2 diabetes mellitus without complications: Secondary | ICD-10-CM | POA: Insufficient documentation

## 2011-09-14 DIAGNOSIS — Z794 Long term (current) use of insulin: Secondary | ICD-10-CM | POA: Insufficient documentation

## 2011-09-14 DIAGNOSIS — Z933 Colostomy status: Secondary | ICD-10-CM | POA: Insufficient documentation

## 2011-09-14 DIAGNOSIS — Z85038 Personal history of other malignant neoplasm of large intestine: Secondary | ICD-10-CM

## 2011-09-14 DIAGNOSIS — Z09 Encounter for follow-up examination after completed treatment for conditions other than malignant neoplasm: Secondary | ICD-10-CM | POA: Insufficient documentation

## 2011-09-14 DIAGNOSIS — Z85048 Personal history of other malignant neoplasm of rectum, rectosigmoid junction, and anus: Secondary | ICD-10-CM | POA: Insufficient documentation

## 2011-09-14 DIAGNOSIS — D126 Benign neoplasm of colon, unspecified: Secondary | ICD-10-CM

## 2011-09-14 DIAGNOSIS — Z7901 Long term (current) use of anticoagulants: Secondary | ICD-10-CM | POA: Insufficient documentation

## 2011-09-14 DIAGNOSIS — Z800 Family history of malignant neoplasm of digestive organs: Secondary | ICD-10-CM | POA: Insufficient documentation

## 2011-09-14 DIAGNOSIS — K922 Gastrointestinal hemorrhage, unspecified: Secondary | ICD-10-CM

## 2011-09-14 DIAGNOSIS — Z7982 Long term (current) use of aspirin: Secondary | ICD-10-CM | POA: Insufficient documentation

## 2011-09-14 DIAGNOSIS — C20 Malignant neoplasm of rectum: Secondary | ICD-10-CM

## 2011-09-14 HISTORY — PX: COLONOSCOPY: SHX5424

## 2011-09-14 HISTORY — DX: Gastro-esophageal reflux disease without esophagitis: K21.9

## 2011-09-14 LAB — CBC
HCT: 32.6 — ABNORMAL LOW
HCT: 33.5 — ABNORMAL LOW
Hemoglobin: 11.2 — ABNORMAL LOW
Hemoglobin: 11.4 — ABNORMAL LOW
Hemoglobin: 11.8 — ABNORMAL LOW
MCHC: 34.8
MCHC: 35.1
MCV: 94.5
MCV: 95.9
Platelets: 196
RBC: 3.38 — ABNORMAL LOW
RBC: 3.45 — ABNORMAL LOW
RBC: 3.55 — ABNORMAL LOW
RDW: 13.3
RDW: 13.5
RDW: 13.7

## 2011-09-14 LAB — BASIC METABOLIC PANEL
BUN: 9
CO2: 27
CO2: 27
CO2: 28
Calcium: 8.7
Calcium: 8.8
Calcium: 9.1
Chloride: 99
Creatinine, Ser: 0.92
Creatinine, Ser: 0.93
Creatinine, Ser: 1.09
GFR calc Af Amer: >60
GFR calc Af Amer: >60
GFR calc Af Amer: >60
GFR calc Af Amer: >60
GFR calc non Af Amer: >60
GFR calc non Af Amer: >60
Glucose, Bld: 184 — ABNORMAL HIGH
Glucose, Bld: 209 — ABNORMAL HIGH
Potassium: 3.6
Potassium: 5
Sodium: 134 — ABNORMAL LOW
Sodium: 134 — ABNORMAL LOW

## 2011-09-14 LAB — GLUCOSE, CAPILLARY: Glucose-Capillary: 151 mg/dL — ABNORMAL HIGH (ref 70–99)

## 2011-09-14 SURGERY — COLONOSCOPY
Anesthesia: Moderate Sedation

## 2011-09-14 MED ORDER — MEPERIDINE HCL 100 MG/ML IJ SOLN
INTRAMUSCULAR | Status: AC
  Filled 2011-09-14: qty 2

## 2011-09-14 MED ORDER — MIDAZOLAM HCL 5 MG/5ML IJ SOLN
INTRAMUSCULAR | Status: DC | PRN
  Administered 2011-09-14: 1 mg via INTRAVENOUS
  Administered 2011-09-14 (×2): 2 mg via INTRAVENOUS

## 2011-09-14 MED ORDER — MEPERIDINE HCL 100 MG/ML IJ SOLN
INTRAMUSCULAR | Status: DC | PRN
  Administered 2011-09-14: 50 mg via INTRAVENOUS
  Administered 2011-09-14: 25 mg via INTRAVENOUS

## 2011-09-14 MED ORDER — MIDAZOLAM HCL 5 MG/5ML IJ SOLN
INTRAMUSCULAR | Status: AC
  Filled 2011-09-14: qty 10

## 2011-09-14 NOTE — Consult Note (Signed)
Lorenza Burton, NP  08/17/2011  1:46 PM  Signed Primary Care Physician:  Dr [name] Primary Gastroenterologist:  Dr. Jena Gauss Oncologist: Dr. Lavell Islam Surgeon: Dr. Claud Kelp Cardiologist: Dr. Alanda Amass CHF Clinic: Dr. Devonne Doughty at [facility] Complaint   Patient presents with   .  Colonoscopy      HPI:  [name] is a 55 y.o. male with history of adenocarcinoma of the rectum.  He was diagnosed and treated in 2008 with neoadjuvant chemoradiation, APR, and postop adjuvant chemotherapy. His last surveillance colonoscopy through his ostomy was 08/26/2008 this was a normal exam. He is due for surveillance. He does note occasional bright red blood from stoma.  Wt stable.  Appetite ok.   Denies any upper GI symptoms including heartburn, indigestion, nausea, vomiting, dysphagia, odynophagia or anorexia. He is on Coumadin. 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    Past Medical History   Diagnosis  Date   .  Acute myocardial infarction, unspecified site, episode of care unspecified     .  Unspecified essential hypertension     .  Type II or unspecified type diabetes mellitus without mention of complication, not stated as uncontrolled     .  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  Malignant neoplasm of rectum  09/2007       invasive adenocarcinoma   .  Prostate cancer     .  CAD (coronary artery disease)     .  S/P colonoscopy  08/26/2008       Normal via ostomy   .  CHF (congestive heart failure)      Past Surgical History   Procedure  Date   .  Internal defibrillator and pacemaker  2010       x2   .  Abdominal and perineal resection of rectum with total mesorectal excision         10/04/2007   .  Colonoscopy         05/17/2007. IMPRESSION: Semilunar, apple-core neoplasm low in the rectum (palpable on digital rectal exam) beginning at 5 cm and corkscrewing up 5 cm in length. This was a low rectal lesion consistent with colorectal  carcinoma. It was biosied multiple times. The upstream colon all the way to the cecum appeared normal. Recommendations: Followup on path. Surgical Consultation        Current Outpatient Prescriptions   Medication  Sig  Dispense  Refill   .  amiodarone (PACERONE) 200 MG tablet  Take 200 mg by mouth 2 (two) times daily.          Marland Kitchen  aspirin 81 MG EC tablet  Take 81 mg by mouth daily.           .  benazepril (LOTENSIN) 40 MG tablet  Take 20 mg by mouth 2 (two) times daily.           .  citalopram (CELEXA) 10 MG tablet  Take 1 tablet by mouth Daily.         Marland Kitchen  co-enzyme Q-10 50 MG capsule  Take 50 mg by mouth daily.           Marland Kitchen  COUMADIN 2.5 MG tablet  Take 2.5 mg by mouth daily.          .  CRESTOR 20 MG tablet  Take 20 mg by mouth daily.          .  digoxin (LANOXIN) 0.125  MG tablet  Take 125 mcg by mouth daily.           .  enalapril (VASOTEC) 10 MG tablet  Take 1 tablet by mouth Daily.         .  Fish Oil OIL  2 tablets daily.           .  furosemide (LASIX) 20 MG tablet  Take 20 mg by mouth 2 (two) times daily.           .  insulin aspart (NOVOLOG) 100 UNIT/ML injection  Inject 14 Units into the skin 3 (three) times daily before meals. 14 units at breakfast and 12 units a lunch and 12 units at supper            .  insulin glargine (LANTUS) 100 UNIT/ML injection  Inject 24 Units into the skin at bedtime.           .  isosorbide mononitrate (IMDUR) 30 MG 24 hr tablet  Take 15 mg by mouth 2 (two) times daily.          .  metFORMIN (GLUCOPHAGE) 500 MG tablet  Take 500 mg by mouth daily.           .  metoprolol (TOPROL-XL) 200 MG 24 hr tablet  Take 100 mg by mouth 2 (two) times daily.           .  niacin (NIASPAN) 1000 MG CR tablet  Take 1,000 mg by mouth at bedtime.           .  nitroGLYCERIN (NITROSTAT) 0.4 MG SL tablet  Place 0.4 mg under the tongue every 5 (five) minutes as needed.           .  pantoprazole (PROTONIX) 40 MG tablet  Take 1 tablet by mouth Daily.         Marland Kitchen  RANEXA 500 MG 12 hr  tablet  Take 1 tablet by mouth Twice daily.         Marland Kitchen  spironolactone (ALDACTONE) 25 MG tablet  Take 25 mg by mouth daily.           .  vitamin C (ASCORBIC ACID) 500 MG tablet  Take 500 mg by mouth daily.            Allergies as of 08/17/2011   .  (No Known Allergies)    Family History   Problem  Relation  Age of Onset   .  Colon cancer  Mother  36   .  Colon cancer  Sister  59   .  Coronary artery disease  Father         History       Social History   .  Marital Status:  Married       Spouse Name:  N/A       Number of Children:  1   .  Years of Education:  N/A       Occupational History   .           Social History Main Topics   .  Smoking status:  Never Smoker    .  Smokeless tobacco:  Not on file   .  Alcohol Use:  No         former user   .  Drug Use:  No   .  Sexually Active:  Not on file       Other Topics  Concern   .  Not on file       Social History Narrative   .  No narrative on file      Review of Systems: Gen: Denies any fever, chills, sweats, anorexia, fatigue, weakness, malaise, weight loss, and sleep disorder CV: Denies chest pain, angina, palpitations, syncope, orthopnea, PND, peripheral edema, and claudication. Resp: Denies dyspnea at rest, dyspnea with exercise, cough, sputum, wheezing, coughing up blood, and pleurisy. GI: Denies vomiting blood, jaundice, and fecal incontinence.   Denies dysphagia or odynophagia. GU : Denies urinary burning, blood in urine, urinary frequency, urinary hesitancy, nocturnal urination, and urinary incontinence. MS: Denies joint pain, limitation of movement, and swelling, stiffness, low back pain, extremity pain. Denies muscle weakness, cramps, atrophy.   Derm: Denies rash, itching, dry skin, hives, moles, warts, or unhealing ulcers.   Psych: Denies depression, anxiety, memory loss, suicidal ideation, hallucinations, paranoia, and confusion. Heme: Denies bruising and enlarged lymph nodes.   Physical Exam: BP  99/68  Pulse 76  Temp(Src) 97 F (36.1 C) (Temporal)  Ht 5\' 10"  (1.778 m)  Wt 229 lb 12.8 oz (104.237 kg)  BMI 32.97 kg/m2 General:   Alert,  Well-developed, well-nourished, pleasant and cooperative in NAD Head:  Normocephalic and atraumatic. Eyes:  Sclera clear, no icterus.   Conjunctiva pink. Ears:  Normal auditory acuity. Nose:  No deformity, discharge,  or lesions. Mouth:  No deformity or lesions, oropharynx pink and moist. Neck:  Supple; no masses or thyromegaly. Lungs:  Clear throughout to auscultation.   No wheezes, crackles, or rhonchi. No acute distress. Heart:  Regular rate and rhythm; no murmurs, clicks, rubs,  or gallops. Pacemaker in place. Abdomen:  Soft, nontender and nondistended. Beefy red stoma with friable margins. Colostomy bag in place with medium brown stool. No masses, hepatosplenomegaly or hernias noted. Normal bowel sounds, without guarding, and without rebound.    Rectal:  Deferred. Msk:  Symmetrical without gross deformities. Normal posture. Pulses:  Normal pulses noted. Extremities:  Without clubbing or edema. Neurologic:  Alert and  oriented x4;  grossly normal neurologically. Skin:  Intact without significant lesions or rashes. Cervical Nodes:  No significant cervical adenopathy. Psych:  Alert and cooperative. Normal mood and affect.         Glendora Score  08/17/2011  2:04 PM  Signed Cc to Dr. Dene Gentry & Fleeta Emmer, Waylen Rainwater, NP  09/13/2011 11:10 AM  Signed Pt procedure to be done on coumadin.  Discussed w/ RMR. We did discuss the fact that he is at a slightly higher risk of GI bleeding given the fact that he is on Coumadin, however the benefits of remaining on the Coumadin outweigh the risk of bleeding. We discussed the life threatening nature of an embolic event. He agrees to remain on Coumadin for this procedure. He also understands that a second procedure may be required since he is on Coumadin if he shows signs of  significant bleeding or is in need of significant intervention that cannot be performed while on Coumadin.  He agrees with all the above and consent will be obtained.           ADENOCARCINOMA, RECTUM Lorenza Burton, NP  08/17/2011  1:41 PM  Addendum [name] is a 55 y.o. 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 He is due for surveillance colonoscopy via his colostomy.  I have discussed risks & benefits which include, but are not limited to, bleeding, infection, perforation & drug reaction.  The patient agrees with this plan & written consent will be obtained.     Pt has defibrillator.  Endo to be informed.   You will have a colonoscopy via your colostomy Drink trilyetly until stools are clear Hold metformin the day of your prep and of your procedure Take 7 units of NovoLog with breakfast, 6 units with lunch, and 6 units with supper the day before your procedure (day of clear liquids and prep) Take Lantus 12 units the night before your prep and the night before your procedure instead of your usual dose of 24 units. Check your blood sugars frequently and call either your PCP or [place] with any problems Bring all of your medications and insulin to any pain hospital the day of the procedure   I have seen the patient prior to the procedure(s) today and reviewed the history and physical / consultation from 08/17/11.  There have been no changes. After consideration of the risks, benefits, alternatives and imponderables, the patient has consented to the procedure(s).

## 2011-09-15 LAB — URINALYSIS, ROUTINE W REFLEX MICROSCOPIC
Glucose, UA: NEGATIVE
Hgb urine dipstick: NEGATIVE
Protein, ur: NEGATIVE
Specific Gravity, Urine: 1.017
pH: 5

## 2011-09-15 LAB — BASIC METABOLIC PANEL
BUN: 13
CO2: 22
CO2: 25
Chloride: 103
Chloride: 104
GFR calc Af Amer: >60
GFR calc non Af Amer: >60
GFR calc non Af Amer: >60
Glucose, Bld: 205 — ABNORMAL HIGH
Glucose, Bld: 210 — ABNORMAL HIGH
Potassium: 4
Potassium: 4
Potassium: 4.3
Sodium: 133 — ABNORMAL LOW

## 2011-09-15 LAB — CBC
HCT: 35.3 — ABNORMAL LOW
HCT: 36 — ABNORMAL LOW
Hemoglobin: 12.1 — ABNORMAL LOW
Hemoglobin: 12.4 — ABNORMAL LOW
Hemoglobin: 13.4
MCHC: 34.4
MCHC: 34.5
MCV: 95.8
MCV: 95.8
MCV: 96.7
MCV: 97.2
Platelets: 173
RBC: 3.63 — ABNORMAL LOW
RBC: 4.07 — ABNORMAL LOW
RDW: 14
RDW: 14.9 — ABNORMAL HIGH
WBC: 11.4 — ABNORMAL HIGH
WBC: 11.4 — ABNORMAL HIGH

## 2011-09-15 LAB — POCT I-STAT 7, (LYTES, BLD GAS, ICA,H+H)
Acid-base deficit: 1
Bicarbonate: 23.1
Operator id: 146431
Potassium: 4.2
Sodium: 135
TCO2: 24

## 2011-09-15 LAB — COMPREHENSIVE METABOLIC PANEL
BUN: 22
CO2: 28
Calcium: 10
Creatinine, Ser: 1.1
GFR calc non Af Amer: >60
Glucose, Bld: 187 — ABNORMAL HIGH
Sodium: 138
Total Protein: 6.7

## 2011-09-15 LAB — CROSSMATCH
ABO/RH(D): O POS
Antibody Screen: NEGATIVE

## 2011-09-15 LAB — DIFFERENTIAL
Eosinophils Absolute: 0.4
Lymphocytes Relative: 15
Lymphs Abs: 1
Monocytes Relative: 9
Neutro Abs: 4.6
Neutrophils Relative %: 70

## 2011-09-21 ENCOUNTER — Encounter (HOSPITAL_COMMUNITY): Payer: Self-pay | Admitting: Internal Medicine

## 2011-09-21 LAB — BASIC METABOLIC PANEL
Calcium: 9.5
Chloride: 104
Creatinine, Ser: 1.04
GFR calc Af Amer: >60
Sodium: 137

## 2011-09-21 LAB — CBC
MCV: 93
RBC: 4.62
WBC: 10.5

## 2011-09-23 ENCOUNTER — Inpatient Hospital Stay (HOSPITAL_COMMUNITY)
Admission: EM | Admit: 2011-09-23 | Discharge: 2011-09-25 | DRG: 193 | Disposition: A | Discharge status: Home / Self Care | Payer: Medicare Other | Attending: Internal Medicine | Admitting: Internal Medicine

## 2011-09-23 ENCOUNTER — Emergency Department (HOSPITAL_COMMUNITY): Payer: Medicare Other

## 2011-09-23 DIAGNOSIS — N181 Chronic kidney disease, stage 1: Secondary | ICD-10-CM | POA: Diagnosis present

## 2011-09-23 DIAGNOSIS — Z95810 Presence of automatic (implantable) cardiac defibrillator: Secondary | ICD-10-CM

## 2011-09-23 DIAGNOSIS — I2510 Atherosclerotic heart disease of native coronary artery without angina pectoris: Secondary | ICD-10-CM | POA: Diagnosis present

## 2011-09-23 DIAGNOSIS — Z833 Family history of diabetes mellitus: Secondary | ICD-10-CM

## 2011-09-23 DIAGNOSIS — Z8546 Personal history of malignant neoplasm of prostate: Secondary | ICD-10-CM

## 2011-09-23 DIAGNOSIS — E119 Type 2 diabetes mellitus without complications: Secondary | ICD-10-CM | POA: Diagnosis present

## 2011-09-23 DIAGNOSIS — Z794 Long term (current) use of insulin: Secondary | ICD-10-CM

## 2011-09-23 DIAGNOSIS — J189 Pneumonia, unspecified organism: Principal | ICD-10-CM | POA: Diagnosis present

## 2011-09-23 DIAGNOSIS — D696 Thrombocytopenia, unspecified: Secondary | ICD-10-CM | POA: Diagnosis present

## 2011-09-23 DIAGNOSIS — R791 Abnormal coagulation profile: Secondary | ICD-10-CM | POA: Diagnosis present

## 2011-09-23 DIAGNOSIS — Z85048 Personal history of other malignant neoplasm of rectum, rectosigmoid junction, and anus: Secondary | ICD-10-CM

## 2011-09-23 DIAGNOSIS — I5022 Chronic systolic (congestive) heart failure: Secondary | ICD-10-CM | POA: Diagnosis present

## 2011-09-23 DIAGNOSIS — E785 Hyperlipidemia, unspecified: Secondary | ICD-10-CM | POA: Diagnosis present

## 2011-09-23 DIAGNOSIS — Z7901 Long term (current) use of anticoagulants: Secondary | ICD-10-CM

## 2011-09-23 DIAGNOSIS — I509 Heart failure, unspecified: Secondary | ICD-10-CM | POA: Diagnosis present

## 2011-09-23 DIAGNOSIS — I252 Old myocardial infarction: Secondary | ICD-10-CM

## 2011-09-23 DIAGNOSIS — J9600 Acute respiratory failure, unspecified whether with hypoxia or hypercapnia: Secondary | ICD-10-CM | POA: Diagnosis present

## 2011-09-23 DIAGNOSIS — I2589 Other forms of chronic ischemic heart disease: Secondary | ICD-10-CM | POA: Diagnosis present

## 2011-09-23 DIAGNOSIS — I129 Hypertensive chronic kidney disease with stage 1 through stage 4 chronic kidney disease, or unspecified chronic kidney disease: Secondary | ICD-10-CM | POA: Diagnosis present

## 2011-09-23 LAB — BASIC METABOLIC PANEL
CO2: 23 mEq/L (ref 19–32)
Calcium: 9.7 mg/dL (ref 8.4–10.5)
Creatinine, Ser: 1.21 mg/dL (ref 0.50–1.35)
Glucose, Bld: 271 mg/dL — ABNORMAL HIGH (ref 70–99)

## 2011-09-23 LAB — TSH: TSH: 0.987 u[IU]/mL (ref 0.350–4.500)

## 2011-09-23 LAB — DIFFERENTIAL
Lymphs Abs: 1 10*3/uL (ref 0.7–4.0)
Monocytes Relative: 9 % (ref 3–12)
Neutro Abs: 10.2 10*3/uL — ABNORMAL HIGH (ref 1.7–7.7)
Neutrophils Relative %: 80 % — ABNORMAL HIGH (ref 43–77)

## 2011-09-23 LAB — CBC
Hemoglobin: 15.8 g/dL (ref 13.0–17.0)
MCH: 34.1 pg — ABNORMAL HIGH (ref 26.0–34.0)
MCV: 93.1 fL (ref 78.0–100.0)
RBC: 4.64 MIL/uL (ref 4.22–5.81)

## 2011-09-23 LAB — GLUCOSE, CAPILLARY
Glucose-Capillary: 235 mg/dL — ABNORMAL HIGH (ref 70–99)
Glucose-Capillary: 147 mg/dL — ABNORMAL HIGH (ref 70–99)

## 2011-09-23 LAB — CARDIAC PANEL(CRET KIN+CKTOT+MB+TROPI)
CK, MB: 2 ng/mL (ref 0.3–4.0)
Relative Index: 0.5 (ref 0.0–2.5)
Relative Index: 0.4 (ref 0.0–2.5)
Relative Index: 0.4 (ref 0.0–2.5)
Troponin I: <0.3 ng/mL (ref <0.30)

## 2011-09-23 LAB — PHOSPHORUS: Phosphorus: 2.4 mg/dL (ref 2.3–4.6)

## 2011-09-23 LAB — MAGNESIUM: Magnesium: 1.6 mg/dL (ref 1.5–2.5)

## 2011-09-24 LAB — COMPREHENSIVE METABOLIC PANEL
ALT: 57 U/L — ABNORMAL HIGH (ref 0–53)
AST: 54 U/L — ABNORMAL HIGH (ref 0–37)
Albumin: 2.9 g/dL — ABNORMAL LOW (ref 3.5–5.2)
Alkaline Phosphatase: 46 U/L (ref 39–117)
Glucose, Bld: 137 mg/dL — ABNORMAL HIGH (ref 70–99)
Potassium: 3.7 mEq/L (ref 3.5–5.1)
Sodium: 139 mEq/L (ref 135–145)
Total Protein: 6.6 g/dL (ref 6.0–8.3)

## 2011-09-24 LAB — GLUCOSE, CAPILLARY
Glucose-Capillary: 201 mg/dL — ABNORMAL HIGH (ref 70–99)
Glucose-Capillary: 108 mg/dL — ABNORMAL HIGH (ref 70–99)
Glucose-Capillary: 129 mg/dL — ABNORMAL HIGH (ref 70–99)

## 2011-09-24 LAB — CBC
HCT: 38 % — ABNORMAL LOW (ref 39.0–52.0)
Platelets: DECREASED 10*3/uL (ref 150–400)
RDW: 13 % (ref 11.5–15.5)
WBC: 7.9 10*3/uL (ref 4.0–10.5)

## 2011-09-24 LAB — PROTIME-INR: Prothrombin Time: 32.8 seconds — ABNORMAL HIGH (ref 11.6–15.2)

## 2011-09-24 LAB — PRO B NATRIURETIC PEPTIDE: Pro B Natriuretic peptide (BNP): 1247 pg/mL — ABNORMAL HIGH (ref 0–125)

## 2011-09-25 LAB — BASIC METABOLIC PANEL
BUN: 18 mg/dL (ref 6–23)
CO2: 26 mEq/L (ref 19–32)
Calcium: 9.6 mg/dL (ref 8.4–10.5)
Creatinine, Ser: 1.26 mg/dL (ref 0.50–1.35)

## 2011-09-25 LAB — CBC
HCT: 37.2 % — ABNORMAL LOW (ref 39.0–52.0)
MCH: 32 pg (ref 26.0–34.0)
MCHC: 33.9 g/dL (ref 30.0–36.0)
RDW: 12.8 % (ref 11.5–15.5)

## 2011-09-25 LAB — PROTIME-INR: INR: 2.66 — ABNORMAL HIGH (ref 0.00–1.49)

## 2011-09-25 LAB — GLUCOSE, CAPILLARY: Glucose-Capillary: 112 mg/dL — ABNORMAL HIGH (ref 70–99)

## 2011-09-25 NOTE — Discharge Summary (Signed)
[name], [name] NO.:  [id number]  MEDICAL RECORD NO.:  [id number]  LOCATION:  3737                         FACILITY:  [facility]  PHYSICIAN:  Robert Blower, MD       DATE OF BIRTH:  [birth year]  DATE OF ADMISSION:  09/23/2011 DATE OF DISCHARGE:  09/25/2011                              DISCHARGE SUMMARY   PRIMARY CARE PHYSICIAN:  [name]. Robert Gambler, MD  DISCHARGE DIAGNOSES: 1. Right lower lobe community-acquired pneumonia. 2. Acute hypoxic respiratory failure due to pneumonia. 3. History of coronary artery disease with ischemic cardiomyopathy     with an ejection fraction of 28%. 4. Hypertension. 5. Insulin-dependent diabetes. 6. History of atrial fibrillation, on chronic anticoagulation. 7. Hyperlipidemia. 8. History of myocardial infarction. 9. History of prostate cancer. 10.History of rectal cancer. 11.Status post implantable cardioverter defibrillator placement. 12.Status post cardiac stenting. 13.History of colostomy. 14.Chronic kidney disease, stage I.  DISCHARGE MEDICATIONS: 1. Cefuroxime 500 mg p.o. twice daily for 8 days. 2. Azithromycin 500 mg p.o. daily for 3 days. 3. Amiloride 50 mg p.o. daily. 4. Amiodarone 200 mg p.o. daily. 5. Aspirin 81 mg p.o. daily at bedtime. 6. Celexa 10 mg p.o. daily. 7. Warfarin 2.5 mg p.o. daily at 5 p.m. 8. Rosuvastatin 20 mg p.o. daily at bedtime. 9. CoQ10 one tablet p.o. daily. 10.Digoxin 0.125 mg p.o. every other day. 11.Enalapril 10 mg p.o. daily. 12.Fish oil over-the-counter 1 tablet p.o. twice daily. 13.Furosemide 20 mg p.o. q.a.m. 14.Isosorbide mononitrate extended release 30 mg p.o. daily. 15.Lantus 28 units subcu daily at bedtime. 16.Metformin 500 mg p.o. twice daily. 17.Metoprolol-XL 200 mg p.o. q.a.m. 18.Niacin SR 1000 mg p.o. daily at bedtime. 19.NovoLog 14 units at breakfast, 12 units for lunch and dinner. 20.Pantoprazole 40 mg p.o. daily. 21.Ranolazine 500 mg p.o. twice  daily. 22.Spironolactone 12.5 mg p.o. daily. 23.Vitamin C 1 tablet p.o. daily.  BRIEF ADMITTING HISTORY AND PHYSICAL:  [name] is a 55 year old Caucasian male with history of systolic congestive heart failure, who presented with progressive weakness and shortness of breath on September 23, 2011.  The patient was also found to be hypoxic with saturation of 83% in the emergency department.  RADIOLOGY/IMAGING: The patient had a chest x-ray 2 view, which shows developing airspace density on the right suggesting right lower lobe pneumonia.  LABS:  CBC shows a white count of 8.0, hemoglobin 12.6, hematocrit 37.2, platelet count 138, INR 2.66. Electrolytes normal with a BUN of 18, creatinine 1.26.  HOSPITAL COURSE BY PROBLEM: 1. Right lower lobe community-acquired pneumonia.  Initially, the     patient was started on ceftriaxone and azithromycin.  The patient     initially was placed on oxygen. As the patient received antibiotics,     the patient's symptoms improved.  The patient reported that he was     ambulating in the hall without any oxygen.  He also reports that     his breathing has improved significantly during the course of     hospital stay.  At discharge, the patient's antibiotics were     transitioned to cefuroxime for 8 more days to complete a 10-day     course and he will  continue azithromycin for 3 more days to     complete a 5-day course of azithromycin.  The patient was     instructed to follow with Dr. Sherwood [name], his primary care physician as     outpatient in 1 week. 2. Acute hypoxic respiratory failure secondary to community-acquired     pneumonia, resolved during the course of hospital stay. 3. History of coronary disease with chronic systolic heart     failure/ischemic cardiomyopathy was stable during the course of     hospital stay.  The patient was compensated.  The patient's     pacer pacemaker was interrogated and it was reported by Dr. Ladona [name]     with Cardiology,  that the pacemaker was functioning normally. 4. Hypertension, stable, continue the patient on medications. 5. Type 2 diabetes, stable.  Continue the patient on home medications. 6. Chronic anticoagulation.  Initially, the patient's Coumadin dose     was held as the patient was supratherapeutic, however, was     resumed at a lower dose during the course hospital stay as     the INR improved to therapeutic range.  At discharge, the     patient will resume home medications at home dose.  The patient     was instructed to follow up with his Anticoagulation Clinic for     PT/INR checked on September 27, 2011.  DISPOSITION/FOLLOWUP:  The patient to follow with Dr. Sherwood [name], his primary care physician in 1 week.  The patient to have PT/INR checked on September 27, 2011.  The patient was off oxygen at the time of discharge and was up ambulating the halls without any worsening shortness of breath.  Time spent on discharge, talking to the patient and coordinating care was 35 minutes.  Robert Blower, MD    SR/MEDQ  D:  09/25/2011  T:  09/25/2011  Job:  [id number]  Electronically Signed by [name]  on 09/25/2011 08:36:01 PM

## 2011-09-27 ENCOUNTER — Encounter: Payer: Self-pay | Admitting: Internal Medicine

## 2011-10-08 NOTE — H&P (Signed)
[name], PAFF NO.:  [id number]  MEDICAL RECORD NO.:  [id number]  LOCATION:  3737                         FACILITY:  [facility]  PHYSICIAN:  Lonia Blood, M.D.      DATE OF BIRTH:  14-Apr-1956  DATE OF ADMISSION:  09/23/2011 DATE OF DISCHARGE:                             HISTORY & PHYSICAL   PRIMARY CARE PHYSICIAN:  [name]. [name], MD  PRESENTING COMPLAINT:  Shortness of breath.  HISTORY OF PRESENT ILLNESS:  The patient is a 55 year old gentleman with known history of CHF who was been having progressive shortness of breath and cough which started this evening.  He started having to clear the back of his throat earlier in the week with intermittent coughing. Tonight, he has severe cold and chills, started having cough and shortness of breath, so he decided to come to the emergency room.  In the ED, the patient was found to be hypoxic in the beginning with oxygen sat of 83%, but later on oxygen he was 92%.  He denied any prior complaints.  He had history of CHF with low EF and was being followed by [facility], but he has not gone through this same problem.  Denied any sick contact.  Denied any chest pain.  No nausea, vomiting, diarrhea.  No recent travel.  PAST MEDICAL HISTORY:  Significant for; 1. Coronary artery disease with ischemic cardiomyopathy, EF last was     around 28%. 2. History of hypertension. 3. Insulin-dependent diabetes. 4. Atrial fibrillation. 5. Hyperlipidemia. 6. Myocardial infarction. 7. Prostate cancer. 8. History of rectal cancer. 9. Status post AICD placement. 10.Status post cardiac stenting. 11.Status post colostomy.  ALLERGIES:  No known drug allergies.  MEDICATIONS:  The patient cannot recall his current medicines, so they are currently pending.  SOCIAL HISTORY:  The patient lives in [place] area.  He denied tobacco, alcohol, or IV drug use.  He is married and lives with  his spouse.  FAMILY HISTORY:  Mainly hypertension and diabetes.  REVIEW OF SYSTEMS:  All systems reviewed are negative except per HPI.  PHYSICAL EXAMINATION:  VITAL SIGNS:  He had temperature of 98.7 orally, blood pressure 134/77 with pulse 85, respiratory rate 24, his sats initially 83% on room air, currently 94% on 2 L. GENERAL:  He is awake, alert, oriented.  He is in no acute distress. HEENT:  PERRL.  EOMI.  No pallor.  No jaundice.  No rhinorrhea. NECK:  Supple with some mild JVD but no lymphadenopathy.  RESPIRATORY: He has decreased air entry at the bases with crackles and rhonchi but no wheezes. CARDIOVASCULAR:  He has paced rhythm with S1, S2.  No audible murmur. ABDOMEN:  Soft, nontender, with positive bowel sounds.  EXTREMITIES:  No edema, cyanosis, or clubbing. SKIN:  No rashes or ulcers.  LABORATORY DATA:  Chest x-ray showed developing airspace density on the right suggestive of right lower lobe pneumonia.  His sodium is 135, potassium 4.6, chloride 100, CO2 23, glucose 271, BUN 17, creatinine 1.21 with a calcium of 9.7.  White count is 12.8 with a left shift, ANC 10.2.  Hemoglobin 15.6 and platelet of 146.  His EKG  showed paced rhythm with a rate of 82.  No significant ST-T wave changes.  ASSESSMENT:  This is a 55 year old gentleman presenting with right lower lobe pneumonia.  More than likely, the pneumonia is secondary to community acquired-type pneumonia.  PLAN: 1. Community-acquired pneumonia.  We will admit the patient to tele     floor due to his cardiac history.  Start him on Rocephin and     Zithromax.  Oxygenation.  We will monitor him closely.  If he     spikes a fever, we will check blood cultures.  Once he is more     stable, we will transition him to oral antibiotics for home use. 2. Coronary artery disease.  He seems stable.  We will cycle his     enzymes if there is any chest pain. 3. Ischemic cardiomyopathy.  Seems compensated.  We will continue  his     home medicine as soon as we get them. 4. Diabetes.  I will put him on sliding scale insulin and his home     medications. 5. Hypertension.  Again, we will resume his home medicines as soon as     possible. 6. Hyperlipidemia.  We will check fasting lipid panel and continue     with home medications.  Further treatment will depend on the     patient's response to these measures.     Lonia Blood, M.D.     Verlin Grills  D:  09/23/2011  T:  09/23/2011  Job:  [id number]  Electronically Signed by Lonia Blood M.D. on 10/08/2011 05:55:47 AM

## 2011-11-08 ENCOUNTER — Other Ambulatory Visit: Payer: Self-pay | Admitting: *Deleted

## 2011-11-08 DIAGNOSIS — C20 Malignant neoplasm of rectum: Secondary | ICD-10-CM

## 2011-11-09 ENCOUNTER — Other Ambulatory Visit: Payer: Self-pay | Admitting: Oncology

## 2011-11-09 ENCOUNTER — Ambulatory Visit (HOSPITAL_BASED_OUTPATIENT_CLINIC_OR_DEPARTMENT_OTHER): Payer: Medicare Other | Admitting: Nurse Practitioner

## 2011-11-09 ENCOUNTER — Other Ambulatory Visit (HOSPITAL_BASED_OUTPATIENT_CLINIC_OR_DEPARTMENT_OTHER): Payer: Medicare Other | Admitting: Lab

## 2011-11-09 ENCOUNTER — Telehealth: Payer: Self-pay | Admitting: Oncology

## 2011-11-09 VITALS — BP 111/64 | HR 82 | Temp 97.4°F | Wt 235.7 lb

## 2011-11-09 DIAGNOSIS — E119 Type 2 diabetes mellitus without complications: Secondary | ICD-10-CM

## 2011-11-09 DIAGNOSIS — C61 Malignant neoplasm of prostate: Secondary | ICD-10-CM

## 2011-11-09 DIAGNOSIS — G620 Drug-induced polyneuropathy: Secondary | ICD-10-CM

## 2011-11-09 DIAGNOSIS — C20 Malignant neoplasm of rectum: Secondary | ICD-10-CM

## 2011-11-09 DIAGNOSIS — D6959 Other secondary thrombocytopenia: Secondary | ICD-10-CM

## 2011-11-09 LAB — CBC WITH DIFFERENTIAL/PLATELET
Basophils Absolute: 0 10*3/uL (ref 0.0–0.1)
Eosinophils Absolute: 0.2 10*3/uL (ref 0.0–0.5)
HCT: 39.4 % (ref 38.4–49.9)
HGB: 13.3 g/dL (ref 13.0–17.1)
LYMPH%: 22.9 % (ref 14.0–49.0)
MCHC: 33.7 g/dL (ref 32.0–36.0)
MONO#: 0.4 10*3/uL (ref 0.1–0.9)
NEUT#: 3.2 10*3/uL (ref 1.5–6.5)
NEUT%: 64.4 % (ref 39.0–75.0)
Platelets: 113 10*3/uL — ABNORMAL LOW (ref 140–400)
WBC: 5 10*3/uL (ref 4.0–10.3)
lymph#: 1.1 10*3/uL (ref 0.9–3.3)

## 2011-11-09 NOTE — Progress Notes (Signed)
OFFICE PROGRESS NOTE  Interval history:  [name] returns as scheduled. He feels well. He reports being hospitalized with pneumonia in October of this year. He has good appetite. He denies pain. Colostomy is functioning normally. No nausea or vomiting. He denies shortness of breath or cough.   Objective: Blood pressure 111/64 heart rate 82 respirations 20 temperature 97.4  Oropharynx is without thrush or ulceration. There are several fractured teeth. No palpable cervical, supraclavicular, axillary or inguinal lymph nodes. Lungs are clear. No wheezes or rales. Regular cardiac rhythm. Abdomen is soft and nontender. No organomegaly. Left lower quadrant colostomy. Extremities are without edema. Calves are soft and nontender   Lab Results: Lab Results  Component Value Date   WBC 5.0 11/09/2011   HGB 13.3 11/09/2011   HCT 39.4 11/09/2011   MCV 97.3 11/09/2011   PLT 113* 11/09/2011    Chemistry:      Component Value Date/Time   NA 140 09/25/2011 0600   K 4.8 09/25/2011 0600   CL 102 09/25/2011 0600   CO2 26 09/25/2011 0600   GLUCOSE 132* 09/25/2011 0600   BUN 18 09/25/2011 0600   CREATININE 1.26 09/25/2011 0600   CALCIUM 9.6 09/25/2011 0600   PROT 6.6 09/24/2011 0624   ALBUMIN 2.9* 09/24/2011 0624   AST 54* 09/24/2011 0624   ALT 57* 09/24/2011 0624   ALKPHOS 46 09/24/2011 0624   BILITOT 0.8 09/24/2011 0624   GFRNONAA 62* 09/25/2011 0600   GFRAA 72* 09/25/2011 0600     Studies/Results: No results found.  Medications: I have reviewed the patient's current medications.  Assessment/Plan:  1. Stage III rectal cancer:  Status post neoadjuvant infusional 5-FU and concurrent radiation.  He underwent an APR 10/04/2007 with the pathology confirming stage III disease.  He completed 8 cycles of adjuvant FOLFOX therapy on 03/12/2008.  A restaging CT 05/26/2010 showed no evidence of metastatic disease.   2. Prostate cancer:  Status post radiation and 2 years of Lupron therapy per Dr.  Vonita Moss. 3. History of thrombocytopenia secondary to chemotherapy. He has persistent mild thrombocytopenia which may be related to chemotherapy, polypharmacy or chronic ITP. The platelet count is stable. 4. History of delayed nausea secondary to chemotherapy:  Improved with Aloxi. 5. History of oxaliplatin neuropathy. 6. Ischemic cardiomyopathy followed by Dr. Alanda Amass. 7. History of bilateral axillary fullness. 8. Diabetes. 9. Status post Port-A-Cath removal. 10. Hospitalization with pneumonia October 2012.  Disposition-[name] appears stable. We will followup on the CEA from today. He will return for a followup visit, CBC and CEA in 6 months. He will contact the office in the interim with any problems.  Plan reviewed with Dr. Truett Perna.    Lonna Cobb ANP/GNP-BC

## 2011-11-09 NOTE — Telephone Encounter (Signed)
gve the pt his may 2013 appt calendar °

## 2011-12-15 DIAGNOSIS — C61 Malignant neoplasm of prostate: Secondary | ICD-10-CM | POA: Diagnosis not present

## 2011-12-15 DIAGNOSIS — N529 Male erectile dysfunction, unspecified: Secondary | ICD-10-CM | POA: Diagnosis not present

## 2011-12-25 ENCOUNTER — Encounter (HOSPITAL_COMMUNITY): Payer: Self-pay | Admitting: *Deleted

## 2011-12-25 ENCOUNTER — Other Ambulatory Visit: Payer: Self-pay

## 2011-12-25 ENCOUNTER — Emergency Department (HOSPITAL_COMMUNITY): Payer: Medicare Other

## 2011-12-25 ENCOUNTER — Emergency Department (HOSPITAL_COMMUNITY)
Admission: EM | Admit: 2011-12-25 | Discharge: 2011-12-26 | Disposition: A | Discharge status: Home / Self Care | Payer: Medicare Other | Attending: Emergency Medicine | Admitting: Emergency Medicine

## 2011-12-25 DIAGNOSIS — R0602 Shortness of breath: Secondary | ICD-10-CM | POA: Insufficient documentation

## 2011-12-25 DIAGNOSIS — R6889 Other general symptoms and signs: Secondary | ICD-10-CM | POA: Insufficient documentation

## 2011-12-25 DIAGNOSIS — R05 Cough: Secondary | ICD-10-CM | POA: Insufficient documentation

## 2011-12-25 DIAGNOSIS — J40 Bronchitis, not specified as acute or chronic: Secondary | ICD-10-CM | POA: Diagnosis not present

## 2011-12-25 DIAGNOSIS — Z79899 Other long term (current) drug therapy: Secondary | ICD-10-CM | POA: Insufficient documentation

## 2011-12-25 DIAGNOSIS — Z85048 Personal history of other malignant neoplasm of rectum, rectosigmoid junction, and anus: Secondary | ICD-10-CM | POA: Insufficient documentation

## 2011-12-25 DIAGNOSIS — I2510 Atherosclerotic heart disease of native coronary artery without angina pectoris: Secondary | ICD-10-CM | POA: Insufficient documentation

## 2011-12-25 DIAGNOSIS — R059 Cough, unspecified: Secondary | ICD-10-CM | POA: Diagnosis not present

## 2011-12-25 DIAGNOSIS — K219 Gastro-esophageal reflux disease without esophagitis: Secondary | ICD-10-CM | POA: Diagnosis not present

## 2011-12-25 DIAGNOSIS — I10 Essential (primary) hypertension: Secondary | ICD-10-CM | POA: Diagnosis not present

## 2011-12-25 DIAGNOSIS — I252 Old myocardial infarction: Secondary | ICD-10-CM | POA: Diagnosis not present

## 2011-12-25 DIAGNOSIS — R079 Chest pain, unspecified: Secondary | ICD-10-CM | POA: Diagnosis not present

## 2011-12-25 DIAGNOSIS — E119 Type 2 diabetes mellitus without complications: Secondary | ICD-10-CM | POA: Diagnosis not present

## 2011-12-25 DIAGNOSIS — I509 Heart failure, unspecified: Secondary | ICD-10-CM | POA: Insufficient documentation

## 2011-12-25 DIAGNOSIS — Z794 Long term (current) use of insulin: Secondary | ICD-10-CM | POA: Diagnosis not present

## 2011-12-25 DIAGNOSIS — Z7982 Long term (current) use of aspirin: Secondary | ICD-10-CM | POA: Diagnosis not present

## 2011-12-25 DIAGNOSIS — J811 Chronic pulmonary edema: Secondary | ICD-10-CM | POA: Diagnosis not present

## 2011-12-25 DIAGNOSIS — Z8546 Personal history of malignant neoplasm of prostate: Secondary | ICD-10-CM | POA: Insufficient documentation

## 2011-12-25 DIAGNOSIS — J209 Acute bronchitis, unspecified: Secondary | ICD-10-CM | POA: Diagnosis not present

## 2011-12-25 LAB — CBC
HCT: 44.6 % (ref 39.0–52.0)
Hemoglobin: 15.2 g/dL (ref 13.0–17.0)
MCH: 31.7 pg (ref 26.0–34.0)
MCHC: 34.1 g/dL (ref 30.0–36.0)
MCV: 93.1 fL (ref 78.0–100.0)
RBC: 4.79 MIL/uL (ref 4.22–5.81)

## 2011-12-25 LAB — BASIC METABOLIC PANEL
BUN: 16 mg/dL (ref 6–23)
CO2: 20 mEq/L (ref 19–32)
Calcium: 9.8 mg/dL (ref 8.4–10.5)
GFR calc non Af Amer: 68 mL/min — ABNORMAL LOW (ref >90)
Glucose, Bld: 225 mg/dL — ABNORMAL HIGH (ref 70–99)

## 2011-12-25 LAB — PROTIME-INR
INR: 2.54 — ABNORMAL HIGH (ref 0.00–1.49)
Prothrombin Time: 27.8 seconds — ABNORMAL HIGH (ref 11.6–15.2)

## 2011-12-25 MED ORDER — ASPIRIN 81 MG PO CHEW
324.0000 mg | CHEWABLE_TABLET | Freq: Once | ORAL | Status: AC
  Administered 2011-12-25: 324 mg via ORAL
  Filled 2011-12-25: qty 4

## 2011-12-25 MED ORDER — ALBUTEROL SULFATE (5 MG/ML) 0.5% IN NEBU
5.0000 mg | INHALATION_SOLUTION | Freq: Once | RESPIRATORY_TRACT | Status: AC
  Administered 2011-12-26: 5 mg via RESPIRATORY_TRACT
  Filled 2011-12-25: qty 1

## 2011-12-25 MED ORDER — IPRATROPIUM BROMIDE 0.02 % IN SOLN
0.5000 mg | Freq: Once | RESPIRATORY_TRACT | Status: AC
  Administered 2011-12-26: 0.5 mg via RESPIRATORY_TRACT
  Filled 2011-12-25: qty 2.5

## 2011-12-25 NOTE — ED Notes (Signed)
Patient with chest pain on the center of his chest that started around 3pm today.  Patient has been coughing for a week and has history of MI

## 2011-12-25 NOTE — ED Provider Notes (Signed)
History     CSN: [id number]  Arrival date & time 12/25/11  2003   First MD Initiated Contact with Patient 12/25/11 2029      Chief Complaint  Patient presents with  . Chest Pain    (Consider location/radiation/quality/duration/timing/severity/associated sxs/prior treatment) HPI Comments: Patient with history of coronary artery disease status post stenting, defibrillator placement, ejection fraction of approximately 20% due to ischemic cardiomyopathy, CHF -- presents with midsternal chest pain that has been intermittent and started after having a coughing episode today. Patient states the pain is brought on by coughing and resolves after the patient coughs. Patient took Tylenol for pain, no aspirin. Pain did not radiate. Patient states he has some shortness of breath with coughing. Patient is currently pain-free unless he coughs. Patient denies wheezing. No chest wall tenderness. Patient did not take any nitroglycerin. Patient has had a runny nose over the past couple days. He denies fever, vomiting, abdominal pain, leg swelling, diarrhea, or urinary symptoms.  Patient is a 56 y.o. male presenting with chest pain. The history is provided by the patient.  Chest Pain The chest pain began 3 - 5 hours ago. Chest pain occurs intermittently. The chest pain is resolved. The pain is associated with coughing. The quality of the pain is described as aching and brief. The pain does not radiate. Primary symptoms include shortness of breath and cough. Pertinent negatives for primary symptoms include no fever, no syncope, no wheezing, no palpitations, no abdominal pain, no nausea, no vomiting and no dizziness.  Procedure history is positive for cardiac catheterization.     Past Medical History  Diagnosis Date  . Unspecified essential hypertension   . Type II or unspecified type diabetes mellitus without mention of complication, not stated as uncontrolled   . Other specified forms of chronic ischemic  heart disease   . CAD (coronary artery disease)   . S/P colonoscopy 08/26/2008    Normal via ostomy  . CHF (congestive heart failure)   . Acute myocardial infarction, unspecified site, episode of care unspecified 2001  . 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 Prostate cancer   . GERD (gastroesophageal reflux disease)     Past Surgical History  Procedure Date  . Internal defibrillator and pacemaker 2010    x2  . Abdominal and perineal resection of rectum with total mesorectal excision     10/04/2007  . Colonoscopy     05/17/2007. IMPRESSION: Semilunar, apple-core neoplasm low in the rectum (palpable on digital rectal exam) beginning at 5 cm and corkscrewing up 5 cm in length. This was a low rectal lesion consistent with colorectal carcinoma. It was biosied multiple times. The upstream colon all the way to the cecum appeared normal. Recommendations: Followup on path. Surgical Consultation   . Coronary angioplasty   . Colonoscopy 09/14/2011    Procedure: COLONOSCOPY;  Surgeon: Corbin Ade, MD;  Location: [facility];  Service: Endoscopy;  Laterality: N/A;  8:30- TCS via colostomy & pt has defibrillator    Family History  Problem Relation Age of Onset  . Colon cancer Mother 54  . Colon cancer Sister 78  . Coronary artery disease Father     History  Substance Use Topics  . Smoking status: Never Smoker   . Smokeless tobacco: Not on file  . Alcohol Use: No     former user      Review of Systems  Constitutional: Negative for fever.  HENT: Negative for neck pain.  Eyes: Negative for redness.  Respiratory: Positive for cough and shortness of breath. Negative for wheezing.   Cardiovascular: Positive for chest pain. Negative for palpitations and syncope.  Gastrointestinal: Negative for nausea, vomiting and abdominal pain.  Genitourinary: Negative for dysuria.  Musculoskeletal: Negative for back pain.  Skin: Negative for rash.  Neurological:  Negative for dizziness, syncope and light-headedness.    Allergies  Review of patient's allergies indicates no known allergies.  Home Medications   Current Outpatient Rx  Name Route Sig Dispense Refill  . AMIODARONE HCL 200 MG PO TABS Oral Take 200 mg by mouth daily.     . ASPIRIN 81 MG PO TBEC Oral Take 81 mg by mouth daily.      Marland Kitchen BENAZEPRIL HCL 10 MG PO TABS Oral Take 10 mg by mouth 2 (two) times daily.      Marland Kitchen BENAZEPRIL HCL 40 MG PO TABS Oral Take 20 mg by mouth 2 (two) times daily.      Marland Kitchen CITALOPRAM HYDROBROMIDE 10 MG PO TABS Oral Take 1 tablet by mouth Daily.    Marland Kitchen CO-ENZYME Q-10 50 MG PO CAPS Oral Take 50 mg by mouth daily.      Marland Kitchen COUMADIN 2.5 MG PO TABS Oral Take 2.5 mg by mouth daily.     . CRESTOR 20 MG PO TABS Oral Take 20 mg by mouth daily.     Marland Kitchen DIGOXIN 0.125 MG PO TABS Oral Take 125 mcg by mouth every other day.     . ENALAPRIL MALEATE 10 MG PO TABS Oral Take 1 tablet by mouth 2 (two) times daily.     . FUROSEMIDE 20 MG PO TABS Oral Take 20 mg by mouth daily.     . INSULIN ASPART 100 UNIT/ML [place] SOLN Subcutaneous Inject 12-14 Units into the skin 3 (three) times daily before meals. 14 units at breakfast and 12 units a lunch and 12 units at supper     . INSULIN GLARGINE 100 UNIT/ML [place] SOLN Subcutaneous Inject 24 Units into the skin at bedtime.      . ISOSORBIDE MONONITRATE ER 30 MG PO TB24 Oral Take 15 mg by mouth 2 (two) times daily.     Marland Kitchen METFORMIN HCL 500 MG PO TABS Oral Take 500 mg by mouth 2 (two) times daily with a meal.     . METOPROLOL SUCCINATE ER 200 MG PO TB24 Oral Take 200 mg by mouth daily.     Marland Kitchen NIACIN ER (ANTIHYPERLIPIDEMIC) 1000 MG PO TBCR Oral Take 1,000 mg by mouth at bedtime.      Marland Kitchen NITROGLYCERIN 0.4 MG/SPRAY TL SOLN Sublingual Place 1 spray under the tongue every 5 (five) minutes as needed. angina     . NITROGLYCERIN 0.4 MG SL SUBL Sublingual Place 0.4 mg under the tongue every 5 (five) minutes as needed.      Marland Kitchen FISH OIL 1200 MG PO CAPS Oral Take 1,200 mg  by mouth 2 (two) times daily.      Marland Kitchen PANTOPRAZOLE SODIUM 40 MG PO TBEC Oral Take 1 tablet by mouth Daily.    Marland Kitchen RANEXA 500 MG PO TB12 Oral Take 1 tablet by mouth Twice daily.    Marland Kitchen SPIRONOLACTONE 25 MG PO TABS Oral Take 12.5 mg by mouth daily.     Marland Kitchen VITAMIN C 500 MG PO TABS Oral Take 500 mg by mouth daily.        BP 156/80  Pulse 76  Temp(Src) 98.6 F (37 C) (Oral)  Resp 17  SpO2 92%  Physical Exam  Nursing note and vitals reviewed. Constitutional: He is oriented to person, place, and time. He appears well-developed and well-nourished.  HENT:  Head: Normocephalic and atraumatic.  Eyes: Conjunctivae are normal. Right eye exhibits no discharge. Left eye exhibits no discharge.  Neck: Normal range of motion. Neck supple.  Cardiovascular: Normal rate, regular rhythm and normal heart sounds.   No murmur heard. Pulmonary/Chest: Effort normal and breath sounds normal. He has no wheezes.  Abdominal: Soft. Bowel sounds are normal. There is no tenderness. There is no rebound and no guarding.  Musculoskeletal: He exhibits no edema.  Neurological: He is alert and oriented to person, place, and time.  Skin: Skin is warm and dry.  Psychiatric: He has a normal mood and affect.    ED Course  Procedures (including critical care time)  Labs Reviewed  CBC - Abnormal; Notable for the following:    WBC 14.4 (*)    Platelets 142 (*)    All other components within normal limits  BASIC METABOLIC PANEL - Abnormal; Notable for the following:    Sodium 134 (*)    Glucose, Bld 225 (*)    GFR calc non Af Amer 68 (*)    GFR calc Af Amer 78 (*)    All other components within normal limits  PROTIME-INR - Abnormal; Notable for the following:    Prothrombin Time 27.8 (*)    INR 2.54 (*)    All other components within normal limits  TROPONIN I  POCT I-STAT TROPONIN I  TROPONIN I   Dg Chest 2 View  12/25/2011  *RADIOLOGY REPORT*  Clinical Data: Chest pain, shortness of breath, and cough  CHEST - 2  VIEW  Comparison: Chest radiograph 09/23/2011, 42,011.  Findings: Left subclavian AICD/pacer with leads in the right atrium, right ventricle, and coronary sinus is stable.  Mild cardiomegaly is stable.  Pulmonary vascularity appears congested and there is diffuse interstitial prominence.  Kerley B lines noted.  No visible pleural effusion.  No acute bony abnormality identified.  IMPRESSION: Mild cardiomegaly and interstitial pulmonary edema.  Original Report Authenticated By: Britta Mccreedy, M.D.     No diagnosis found.  8:57 PM patient seen and examined. Workup ordered. Aspirin ordered. Previous records reviewed.  10:49 PM Patient was discussed with Felisa Bonier, MD  Pt informed of work-up to this point. Will ambulate and monitor pulse ox. Will check 2nd cardiac marker. Given presentation, do not suspect ACS given short-lived episodes of CP that resolve with episode of cough.   11:21 PM Nurse reports O2 sat to 92% with ambulation. Breathing treatment ordered. Pt was seen by Dr. Fredricka Bonine.   1:30 AM Patient and wife informed of results. Patient states he received significant improvement with breathing treatment. Will treat the patient for bronchitis.  1:30 AM Patient counseled on use of albuterol HFA.  Told to use 1-2 puffs q 4 hours as needed for SOB.  Will discharge to home.  Patient told to follow-up with PCP in next week.  Patient told to return to ED with persistent CP associated with exertion, sweating, racing heart, palpitations, shortness of breath, lightheadedness, radiation of pain into jaw, neck, or arms.  Patient verbalizes understanding and agrees with plan.      MDM  Patients with significant cardiac history with atypical chest pain that occurred only with coughing. Cardiac workup was performed and 2 sets of cardiac markers were negative. Chest x-ray does not show an infection. Do not suspect significant  congestive heart failure exacerbation given lack of overload symptoms on exam.  Do not suspect cardiac cause of chest pain given atypical presentation, short-lived episodes occurring only with cough. Will treat with albuterol. Considered antibiotics and prednisone however given patient's multiple medications will avoid given concerns of interactions. Patient appears well and is stable for discharge home.        Eustace Moore Dunbar, [place] 12/26/11 [phone]

## 2011-12-25 NOTE — ED Notes (Signed)
PT O2 saturation was 92 while ambulating

## 2011-12-26 DIAGNOSIS — J209 Acute bronchitis, unspecified: Secondary | ICD-10-CM | POA: Diagnosis not present

## 2011-12-26 MED ORDER — ALBUTEROL SULFATE HFA 108 (90 BASE) MCG/ACT IN AERS
2.0000 | INHALATION_SPRAY | RESPIRATORY_TRACT | Status: DC | PRN
  Administered 2011-12-26: 2 via RESPIRATORY_TRACT
  Filled 2011-12-26: qty 6.7

## 2011-12-26 MED ORDER — AEROCHAMBER MAX W/MASK SMALL MISC
1.0000 | Freq: Once | Status: AC
  Administered 2011-12-26: 1
  Filled 2011-12-26: qty 1

## 2011-12-26 MED ORDER — AEROCHAMBER PLUS W/MASK MISC
Status: AC
  Administered 2011-12-26: 1
  Filled 2011-12-26: qty 1

## 2011-12-26 NOTE — ED Notes (Signed)
Instructions given on use of Aerochamber and albuterol inhaler  Demonstrated the same.

## 2012-01-05 NOTE — ED Provider Notes (Signed)
Evaluation and management procedures were performed by the PA/NP under my supervision/collaboration.    Felisa Bonier, MD 01/05/12 [phone]

## 2012-02-24 DIAGNOSIS — Z7901 Long term (current) use of anticoagulants: Secondary | ICD-10-CM | POA: Diagnosis not present

## 2012-02-24 DIAGNOSIS — I2510 Atherosclerotic heart disease of native coronary artery without angina pectoris: Secondary | ICD-10-CM | POA: Diagnosis not present

## 2012-02-24 DIAGNOSIS — E782 Mixed hyperlipidemia: Secondary | ICD-10-CM | POA: Diagnosis not present

## 2012-02-24 DIAGNOSIS — R079 Chest pain, unspecified: Secondary | ICD-10-CM | POA: Diagnosis not present

## 2012-02-24 DIAGNOSIS — R5381 Other malaise: Secondary | ICD-10-CM | POA: Diagnosis not present

## 2012-02-24 DIAGNOSIS — R0602 Shortness of breath: Secondary | ICD-10-CM | POA: Diagnosis not present

## 2012-02-24 DIAGNOSIS — Z79899 Other long term (current) drug therapy: Secondary | ICD-10-CM | POA: Diagnosis not present

## 2012-02-24 DIAGNOSIS — I5022 Chronic systolic (congestive) heart failure: Secondary | ICD-10-CM | POA: Diagnosis not present

## 2012-02-24 DIAGNOSIS — E039 Hypothyroidism, unspecified: Secondary | ICD-10-CM | POA: Diagnosis not present

## 2012-02-24 DIAGNOSIS — E119 Type 2 diabetes mellitus without complications: Secondary | ICD-10-CM | POA: Diagnosis not present

## 2012-03-08 DIAGNOSIS — I2510 Atherosclerotic heart disease of native coronary artery without angina pectoris: Secondary | ICD-10-CM | POA: Diagnosis not present

## 2012-03-08 DIAGNOSIS — I10 Essential (primary) hypertension: Secondary | ICD-10-CM | POA: Diagnosis not present

## 2012-03-08 DIAGNOSIS — E782 Mixed hyperlipidemia: Secondary | ICD-10-CM | POA: Diagnosis not present

## 2012-04-11 ENCOUNTER — Encounter (HOSPITAL_COMMUNITY): Payer: Self-pay

## 2012-04-11 ENCOUNTER — Emergency Department (HOSPITAL_COMMUNITY)
Admission: EM | Admit: 2012-04-11 | Discharge: 2012-04-11 | Disposition: A | Discharge status: Home / Self Care | Payer: Medicare Other | Attending: Emergency Medicine | Admitting: Emergency Medicine

## 2012-04-11 ENCOUNTER — Emergency Department (HOSPITAL_COMMUNITY): Payer: Medicare Other

## 2012-04-11 DIAGNOSIS — R05 Cough: Secondary | ICD-10-CM

## 2012-04-11 DIAGNOSIS — R5381 Other malaise: Secondary | ICD-10-CM | POA: Diagnosis not present

## 2012-04-11 DIAGNOSIS — E119 Type 2 diabetes mellitus without complications: Secondary | ICD-10-CM | POA: Insufficient documentation

## 2012-04-11 DIAGNOSIS — Z95810 Presence of automatic (implantable) cardiac defibrillator: Secondary | ICD-10-CM | POA: Diagnosis not present

## 2012-04-11 DIAGNOSIS — R509 Fever, unspecified: Secondary | ICD-10-CM | POA: Diagnosis not present

## 2012-04-11 DIAGNOSIS — I2510 Atherosclerotic heart disease of native coronary artery without angina pectoris: Secondary | ICD-10-CM | POA: Diagnosis not present

## 2012-04-11 DIAGNOSIS — I252 Old myocardial infarction: Secondary | ICD-10-CM | POA: Diagnosis not present

## 2012-04-11 DIAGNOSIS — Z794 Long term (current) use of insulin: Secondary | ICD-10-CM | POA: Insufficient documentation

## 2012-04-11 DIAGNOSIS — R0609 Other forms of dyspnea: Secondary | ICD-10-CM | POA: Insufficient documentation

## 2012-04-11 DIAGNOSIS — R0989 Other specified symptoms and signs involving the circulatory and respiratory systems: Secondary | ICD-10-CM | POA: Insufficient documentation

## 2012-04-11 DIAGNOSIS — I10 Essential (primary) hypertension: Secondary | ICD-10-CM | POA: Insufficient documentation

## 2012-04-11 DIAGNOSIS — Z8546 Personal history of malignant neoplasm of prostate: Secondary | ICD-10-CM | POA: Diagnosis not present

## 2012-04-11 DIAGNOSIS — R0602 Shortness of breath: Secondary | ICD-10-CM | POA: Insufficient documentation

## 2012-04-11 DIAGNOSIS — J9819 Other pulmonary collapse: Secondary | ICD-10-CM | POA: Diagnosis not present

## 2012-04-11 DIAGNOSIS — R059 Cough, unspecified: Secondary | ICD-10-CM | POA: Insufficient documentation

## 2012-04-11 DIAGNOSIS — R5383 Other fatigue: Secondary | ICD-10-CM | POA: Insufficient documentation

## 2012-04-11 DIAGNOSIS — Z79899 Other long term (current) drug therapy: Secondary | ICD-10-CM | POA: Diagnosis not present

## 2012-04-11 LAB — PRO B NATRIURETIC PEPTIDE: Pro B Natriuretic peptide (BNP): 1092 pg/mL — ABNORMAL HIGH (ref 0–125)

## 2012-04-11 LAB — COMPREHENSIVE METABOLIC PANEL
AST: 26 U/L (ref 0–37)
BUN: 18 mg/dL (ref 6–23)
CO2: 23 mEq/L (ref 19–32)
Calcium: 10 mg/dL (ref 8.4–10.5)
Creatinine, Ser: 1.15 mg/dL (ref 0.50–1.35)
GFR calc Af Amer: 80 mL/min — ABNORMAL LOW (ref >90)
GFR calc non Af Amer: 69 mL/min — ABNORMAL LOW (ref >90)

## 2012-04-11 LAB — DIFFERENTIAL
Eosinophils Relative: 0 % (ref 0–5)
Lymphs Abs: 0.7 10*3/uL (ref 0.7–4.0)
Monocytes Absolute: 0.9 10*3/uL (ref 0.1–1.0)

## 2012-04-11 LAB — CBC
MCH: 30.4 pg (ref 26.0–34.0)
MCV: 90.9 fL (ref 78.0–100.0)
Platelets: 160 10*3/uL (ref 150–400)
RBC: 4.97 MIL/uL (ref 4.22–5.81)
RDW: 13.5 % (ref 11.5–15.5)

## 2012-04-11 LAB — TROPONIN I: Troponin I: <0.3 ng/mL (ref <0.30)

## 2012-04-11 LAB — PROTIME-INR: Prothrombin Time: 23.6 seconds — ABNORMAL HIGH (ref 11.6–15.2)

## 2012-04-11 MED ORDER — FUROSEMIDE 10 MG/ML IJ SOLN
80.0000 mg | Freq: Once | INTRAMUSCULAR | Status: AC
  Administered 2012-04-11: 80 mg via INTRAVENOUS
  Filled 2012-04-11: qty 8

## 2012-04-11 MED ORDER — FUROSEMIDE 20 MG PO TABS: 20.0000 mg | ORAL_TABLET | Freq: Two times a day (BID) | ORAL | Status: DC

## 2012-04-11 MED ORDER — ACETAMINOPHEN 500 MG PO TABS
1000.0000 mg | ORAL_TABLET | Freq: Once | ORAL | Status: AC
  Administered 2012-04-11: 1000 mg via ORAL
  Filled 2012-04-11 (×2): qty 2

## 2012-04-11 MED ORDER — ALBUTEROL SULFATE (5 MG/ML) 0.5% IN NEBU
2.5000 mg | INHALATION_SOLUTION | RESPIRATORY_TRACT | Status: AC
  Administered 2012-04-11: 2.5 mg via RESPIRATORY_TRACT
  Filled 2012-04-11: qty 0.5

## 2012-04-11 NOTE — ED Notes (Signed)
Pt. Reports cough that started during the night. This morning at 0845 coughing increased and SOB started.  Per EMS blood tinged sputum and sternal pain. Pt. Reports generalized muscle fatigue and soreness. Pt. Reports headache. NAD.

## 2012-04-11 NOTE — ED Notes (Signed)
Spoke with pharmacy will be sending medication via tube system.

## 2012-04-11 NOTE — Discharge Instructions (Signed)
As discussed, it is very important that you follow up with your physician tomorrow via telephone.  If you develop any new, or concerning changes in your condition prior to that, please return to the emergency department immediately.  Please make sure to call your medication as directed, including the new dose of Lasix for the next days, until you have confirmed yourr medications with your physician.

## 2012-04-11 NOTE — ED Notes (Signed)
PT ambulated with a steady gait; VSS; A&Ox3; no signs of distress; respirations even and unlabored; skin warm and dry; no questions at this time.  

## 2012-04-11 NOTE — ED Provider Notes (Signed)
History     CSN: [id number]  Arrival date & time 04/11/12  1403   First MD Initiated Contact with Patient 04/11/12 1501      Chief Complaint  Patient presents with  . Respiratory Distress   The patient's chief complaint is cough and dyspnea are not respiratory distress  HPI The patient p/w cough / dyspnea.  He was in his [facility] until ~12hr pta.  He awoke with coughing and dyspnea.  Since onset he has had innumerable spells of coughing that are not clearly relieved with anything.  There are no clear exacerbating factors, nor was there a notable precipitant.  The patient has been compliant with all meds.  During this illness, he has minimal dyspnea when not coughing, and cp that is associated with the cough.  Otherwise there is no CP.  The pain is sore, anterior, non-exertional. During the hours PTA, the patient notes that he had several episodes of "pink-tinged" sputum as well.  Following initial interventions, the patient's wife appears.  She notes that multiple family members have had URI like symptoms over the past few days.  Past Medical History  Diagnosis Date  . Unspecified essential hypertension   . Type II or unspecified type diabetes mellitus without mention of complication, not stated as uncontrolled   . 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 CAD (coronary artery disease)   . S/P colonoscopy 08/26/2008    Normal via ostomy  . CHF (congestive heart failure)   . Acute myocardial infarction, unspecified site, episode of care unspecified 2001  . 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 Prostate cancer   . GERD (gastroesophageal reflux disease)     Past Surgical History  Procedure Date  . Internal defibrillator and pacemaker 2010    x2  . Abdominal and perineal resection of rectum with total mesorectal excision     10/04/2007  . Colonoscopy     05/17/2007. IMPRESSION: Semilunar, apple-core neoplasm low in the rectum (palpable on  digital rectal exam) beginning at 5 cm and corkscrewing up 5 cm in length. This was a low rectal lesion consistent with colorectal carcinoma. It was biosied multiple times. The upstream colon all the way to the cecum appeared normal. Recommendations: Followup on path. Surgical Consultation   . Coronary angioplasty   . Colonoscopy 09/14/2011    Procedure: COLONOSCOPY;  Surgeon: Corbin Ade, MD;  Location: [facility];  Service: Endoscopy;  Laterality: N/A;  8:30- TCS via colostomy & pt has defibrillator    Family History  Problem Relation Age of Onset  . Colon cancer Mother 3  . Colon cancer Sister 13  . Coronary artery disease Father     History  Substance Use Topics  . Smoking status: Never Smoker   . Smokeless tobacco: Not on file  . Alcohol Use: No     former user      Review of Systems  Constitutional:       Per HPI, otherwise negative  HENT:       Per HPI, otherwise negative  Eyes: Negative.   Respiratory:       Per HPI, otherwise negative  Cardiovascular:       Per HPI, otherwise negative  Gastrointestinal: Negative for vomiting.  Genitourinary: Negative.   Musculoskeletal:       Per HPI, otherwise negative  Skin: Negative.   Neurological: Negative for syncope.    Allergies  Review of patient's allergies indicates  no known allergies.  Home Medications   Current Outpatient Rx  Name Route Sig Dispense Refill  . AMIODARONE HCL 200 MG PO TABS Oral Take 200 mg by mouth daily.     . ASPIRIN 81 MG PO TBEC Oral Take 81 mg by mouth at bedtime.     Marland Kitchen BENAZEPRIL HCL 40 MG PO TABS Oral Take 20 mg by mouth 2 (two) times daily.      Marland Kitchen CITALOPRAM HYDROBROMIDE 10 MG PO TABS Oral Take 10 mg by mouth Daily.     Marland Kitchen CO-ENZYME Q-10 50 MG PO CAPS Oral Take 50 mg by mouth daily.      Marland Kitchen COUMADIN 2.5 MG PO TABS Oral Take 2.5 mg by mouth every evening.     Marland Kitchen CRESTOR 20 MG PO TABS Oral Take 20 mg by mouth daily.     Marland Kitchen DIGOXIN 0.125 MG PO TABS Oral Take 125 mcg by mouth every  other day.     . ENALAPRIL MALEATE 10 MG PO TABS Oral Take 10 mg by mouth 2 (two) times daily.     . FUROSEMIDE 20 MG PO TABS Oral Take 20 mg by mouth daily.     . INSULIN ASPART 100 UNIT/ML [place] SOLN Subcutaneous Inject 12-14 Units into the skin 3 (three) times daily before meals. 14 units at breakfast and 12 units a lunch and 12 units at supper per sliding scale     . INSULIN GLARGINE 100 UNIT/ML [place] SOLN Subcutaneous Inject 24 Units into the skin at bedtime.      . ISOSORBIDE MONONITRATE ER 30 MG PO TB24 Oral Take 15 mg by mouth 2 (two) times daily.     Marland Kitchen METFORMIN HCL 500 MG PO TABS Oral Take 500 mg by mouth 2 (two) times daily with a meal.     . METOPROLOL SUCCINATE ER 200 MG PO TB24 Oral Take 200 mg by mouth daily.     Marland Kitchen NIACIN ER (ANTIHYPERLIPIDEMIC) 1000 MG PO TBCR Oral Take 1,000 mg by mouth at bedtime.      Marland Kitchen NITROGLYCERIN 0.4 MG/SPRAY TL SOLN Sublingual Place 1 spray under the tongue every 5 (five) minutes as needed. angina    . FISH OIL 1200 MG PO CAPS Oral Take 1,200 mg by mouth 2 (two) times daily.      Marland Kitchen PANTOPRAZOLE SODIUM 40 MG PO TBEC Oral Take 40 mg by mouth Daily.     Marland Kitchen RANEXA 500 MG PO TB12 Oral Take 500 mg by mouth Twice daily.     Marland Kitchen SPIRONOLACTONE 25 MG PO TABS Oral Take 12.5 mg by mouth daily.     Marland Kitchen VITAMIN C 500 MG PO TABS Oral Take 500 mg by mouth daily.        BP 154/86  Temp(Src) 100.6 F (38.1 C) (Oral)  Resp 20  SpO2 98%  Physical Exam  Nursing note and vitals reviewed. Constitutional: He is oriented to person, place, and time. He appears well-developed. No distress.  HENT:  Head: Normocephalic and atraumatic.  Eyes: Conjunctivae and EOM are normal.  Cardiovascular: Normal rate and regular rhythm.   Pulmonary/Chest: Effort normal. No stridor. No respiratory distress.  Abdominal: He exhibits no distension.  Musculoskeletal: He exhibits no edema.  Neurological: He is alert and oriented to person, place, and time.  Skin: Skin is warm and dry.  Psychiatric:  He has a normal mood and affect.    ED Course  Procedures (including critical care time)   Labs Reviewed  CBC  DIFFERENTIAL  COMPREHENSIVE METABOLIC PANEL  TROPONIN I  PRO B NATRIURETIC PEPTIDE  PROTIME-INR   No results found.   No diagnosis found.  Cardiac: 80 paced, abnormal  Pulse 99% on [place], abnormal   Date: 04/11/2012  Rate: 79  Rhythm: normal sinus rhythm  QRS Axis: normal  Intervals: normal  ST/T Wave abnormalities: nonspecific ST changes  Conduction Disutrbances:right bundle branch block  Narrative Interpretation:   Old EKG Reviewed: changes noted  NOT PACED, abnormal ecg  Chest x-ray without ulnar edema or consolidation, reviewed by me  MDM  This male with multiple medical problems, including congestive heart failure, previous MI, now presents with ongoing cough and dyspnea.  On initial exam the patient is a comfortable-appearing.  The patient's evaluation here is most notable for the elevation in his BNP, the absence of acute consolidation on his chest x-ray.  The patient also has a mild fever.  The patient's ECG is nonischemic, and not paced, though on the rhythm strip is occasionally paced.  Given the denial of chest pain, ischemic findings, evidence of subtherapeutic INR, any neurologic complaints or findings, and with the patient's noted improvement following albuterol treatment, he is stable for discharge with continued management and evaluation by his primary care physician for this presentation for cough and fever.  Given the elevation in BNP, the patient received Lasix and was instructed to increase his dose until instructed further by his physician.  I discussed the findings, and also to patient and his wife.  Explicit return precautions were provided, acknowledging that this may be an early presentation for disease that has not fully manifested and return may be required.     Gerhard Munch, MD 04/11/12 1950

## 2012-04-13 ENCOUNTER — Telehealth (INDEPENDENT_AMBULATORY_CARE_PROVIDER_SITE_OTHER): Payer: Self-pay

## 2012-04-13 NOTE — Telephone Encounter (Signed)
Msg left for Terrie at [facility] ok to fill order for colostomy supplies and send order for sig.

## 2012-04-14 ENCOUNTER — Telehealth: Payer: Self-pay | Admitting: Oncology

## 2012-04-14 NOTE — Telephone Encounter (Signed)
called pts home lmovm that his apppt on 05/31 was r/s to 06/07

## 2012-04-17 DIAGNOSIS — Z7901 Long term (current) use of anticoagulants: Secondary | ICD-10-CM | POA: Diagnosis not present

## 2012-04-17 DIAGNOSIS — J069 Acute upper respiratory infection, unspecified: Secondary | ICD-10-CM | POA: Diagnosis not present

## 2012-04-17 DIAGNOSIS — Z6834 Body mass index (BMI) 34.0-34.9, adult: Secondary | ICD-10-CM | POA: Diagnosis not present

## 2012-04-17 DIAGNOSIS — E782 Mixed hyperlipidemia: Secondary | ICD-10-CM | POA: Diagnosis not present

## 2012-04-17 DIAGNOSIS — R05 Cough: Secondary | ICD-10-CM | POA: Diagnosis not present

## 2012-05-02 ENCOUNTER — Other Ambulatory Visit (HOSPITAL_BASED_OUTPATIENT_CLINIC_OR_DEPARTMENT_OTHER): Payer: Medicare Other | Admitting: Lab

## 2012-05-02 DIAGNOSIS — C20 Malignant neoplasm of rectum: Secondary | ICD-10-CM

## 2012-05-02 DIAGNOSIS — Z452 Encounter for adjustment and management of vascular access device: Secondary | ICD-10-CM | POA: Diagnosis not present

## 2012-05-02 DIAGNOSIS — Z125 Encounter for screening for malignant neoplasm of prostate: Secondary | ICD-10-CM

## 2012-05-02 LAB — CBC WITH DIFFERENTIAL/PLATELET
BASO%: 0.3 % (ref 0.0–2.0)
Basophils Absolute: 0 10*3/uL (ref 0.0–0.1)
EOS%: 0.4 % (ref 0.0–7.0)
HCT: 43.6 % (ref 38.4–49.9)
LYMPH%: 13.1 % — ABNORMAL LOW (ref 14.0–49.0)
MCH: 31.2 pg (ref 27.2–33.4)
MCHC: 33.4 g/dL (ref 32.0–36.0)
MCV: 93.3 fL (ref 79.3–98.0)
NEUT%: 80.2 % — ABNORMAL HIGH (ref 39.0–75.0)
Platelets: 163 10*3/uL (ref 140–400)

## 2012-05-05 ENCOUNTER — Telehealth: Payer: Self-pay | Admitting: Oncology

## 2012-05-05 ENCOUNTER — Ambulatory Visit: Payer: Medicare Other | Admitting: Oncology

## 2012-05-05 NOTE — Telephone Encounter (Signed)
called pts home and was unable to leave a message. called mobile and it is disconnected.  called wife work number and she was not working today  will mail new appt d/t to pt today with stamp envelope

## 2012-05-12 ENCOUNTER — Ambulatory Visit: Payer: Medicare Other | Admitting: Oncology

## 2012-05-16 DIAGNOSIS — Z45018 Encounter for adjustment and management of other part of cardiac pacemaker: Secondary | ICD-10-CM | POA: Diagnosis not present

## 2012-05-16 DIAGNOSIS — E119 Type 2 diabetes mellitus without complications: Secondary | ICD-10-CM | POA: Diagnosis not present

## 2012-05-16 DIAGNOSIS — I2510 Atherosclerotic heart disease of native coronary artery without angina pectoris: Secondary | ICD-10-CM | POA: Diagnosis not present

## 2012-05-16 DIAGNOSIS — I5022 Chronic systolic (congestive) heart failure: Secondary | ICD-10-CM | POA: Diagnosis not present

## 2012-05-16 DIAGNOSIS — Z79899 Other long term (current) drug therapy: Secondary | ICD-10-CM | POA: Diagnosis not present

## 2012-05-16 DIAGNOSIS — I4891 Unspecified atrial fibrillation: Secondary | ICD-10-CM | POA: Diagnosis not present

## 2012-05-16 DIAGNOSIS — R0602 Shortness of breath: Secondary | ICD-10-CM | POA: Diagnosis not present

## 2012-05-19 ENCOUNTER — Telehealth: Payer: Self-pay | Admitting: Oncology

## 2012-05-19 ENCOUNTER — Ambulatory Visit (HOSPITAL_BASED_OUTPATIENT_CLINIC_OR_DEPARTMENT_OTHER): Payer: Medicare Other | Admitting: Oncology

## 2012-05-19 VITALS — BP 116/69 | HR 67 | Temp 97.1°F | Ht 70.0 in | Wt 223.4 lb

## 2012-05-19 DIAGNOSIS — C20 Malignant neoplasm of rectum: Secondary | ICD-10-CM | POA: Diagnosis not present

## 2012-05-19 DIAGNOSIS — C61 Malignant neoplasm of prostate: Secondary | ICD-10-CM | POA: Diagnosis not present

## 2012-05-19 DIAGNOSIS — E119 Type 2 diabetes mellitus without complications: Secondary | ICD-10-CM | POA: Diagnosis not present

## 2012-05-19 DIAGNOSIS — I2589 Other forms of chronic ischemic heart disease: Secondary | ICD-10-CM | POA: Diagnosis not present

## 2012-05-19 NOTE — Telephone Encounter (Signed)
Gv pt appt for march2014 

## 2012-05-19 NOTE — Progress Notes (Signed)
   [facility]    OFFICE PROGRESS NOTE   INTERVAL HISTORY:   He returns as scheduled. He feels well. Good appetite and energy level. He reports occasional abdominal discomfort when he has a bowel movement. No other complaint.  Objective:  Vital signs in last 24 hours:  Blood pressure 116/69, pulse 67, temperature 97.1 F (36.2 C), temperature source Oral, height 5\' 10"  (1.778 m), weight 223 lb 6.4 oz (101.334 kg).    HEENT: Neck without mass Lymphatics: No cervical, supraclavicular, axillary, or inguinal nodes. Prominent bilateral axillary fat pads. Resp: Lungs clear bilaterally Cardio: Regular rate and rhythm GI: No hepatosplenomegaly, no mass, nontender, left lower quadrant colostomy, perineal scar without evidence of recurrent tumor Vascular: No leg edema   Lab Results:  Lab Results  Component Value Date   WBC 9.3 05/02/2012   HGB 14.6 05/02/2012   HCT 43.6 05/02/2012   MCV 93.3 05/02/2012   PLT 163 05/02/2012   CEA <0.5 on 05/02/12   Medications: I have reviewed the patient's current medications.  Assessment/Plan: 1. Stage III rectal cancer, diagnosed in June of 2008: Status post neoadjuvant infusional 5-FU and concurrent radiation. He underwent an APR 10/04/2007 with the pathology confirming stage III disease. He completed 8 cycles of adjuvant FOLFOX therapy on 03/12/2008. A restaging CT 05/26/2010 showed no evidence of metastatic disease. He underwent a colonoscopy in October of 2012 with removal of a single pedunculated polyp-benign pathology 2. Prostate cancer: Status post radiation and 2 years of Lupron therapy per Dr. Vonita Moss. 3. History of thrombocytopenia secondary to chemotherapy. The platelet count was normal on 05/02/12. 4. History of delayed nausea secondary to chemotherapy: Improved with Aloxi. 5. History of oxaliplatin neuropathy. 6. Ischemic cardiomyopathy followed by Dr. Alanda Amass. 7. History of bilateral axillary  fullness. 8. Diabetes. 9. Status post Port-A-Cath removal. 10. Hospitalization with pneumonia October 2012.   Disposition:  Mr. Meckel remains in clinical remission from rectal cancer. He would like to continue followup at the cancer Center. He will return for an office visit and CEA in 9 months.   Thornton Papas, MD  05/19/2012  10:13 AM

## 2012-07-06 DIAGNOSIS — I519 Heart disease, unspecified: Secondary | ICD-10-CM | POA: Diagnosis not present

## 2012-07-06 DIAGNOSIS — I2510 Atherosclerotic heart disease of native coronary artery without angina pectoris: Secondary | ICD-10-CM | POA: Diagnosis not present

## 2012-07-06 DIAGNOSIS — I10 Essential (primary) hypertension: Secondary | ICD-10-CM | POA: Diagnosis not present

## 2012-07-06 DIAGNOSIS — E119 Type 2 diabetes mellitus without complications: Secondary | ICD-10-CM | POA: Diagnosis not present

## 2012-07-06 DIAGNOSIS — I4891 Unspecified atrial fibrillation: Secondary | ICD-10-CM | POA: Diagnosis not present

## 2012-07-06 DIAGNOSIS — E785 Hyperlipidemia, unspecified: Secondary | ICD-10-CM | POA: Diagnosis not present

## 2012-07-06 DIAGNOSIS — I509 Heart failure, unspecified: Secondary | ICD-10-CM | POA: Diagnosis not present

## 2012-07-06 DIAGNOSIS — I5022 Chronic systolic (congestive) heart failure: Secondary | ICD-10-CM | POA: Diagnosis not present

## 2012-07-06 DIAGNOSIS — Z79899 Other long term (current) drug therapy: Secondary | ICD-10-CM | POA: Diagnosis not present

## 2012-07-06 DIAGNOSIS — E7800 Pure hypercholesterolemia, unspecified: Secondary | ICD-10-CM | POA: Diagnosis not present

## 2012-07-06 DIAGNOSIS — I428 Other cardiomyopathies: Secondary | ICD-10-CM | POA: Diagnosis not present

## 2012-07-06 DIAGNOSIS — I252 Old myocardial infarction: Secondary | ICD-10-CM | POA: Diagnosis not present

## 2012-07-06 DIAGNOSIS — E669 Obesity, unspecified: Secondary | ICD-10-CM | POA: Diagnosis not present

## 2012-07-06 DIAGNOSIS — Z950 Presence of cardiac pacemaker: Secondary | ICD-10-CM | POA: Diagnosis not present

## 2012-08-04 DIAGNOSIS — C61 Malignant neoplasm of prostate: Secondary | ICD-10-CM | POA: Diagnosis not present

## 2012-08-10 DIAGNOSIS — C61 Malignant neoplasm of prostate: Secondary | ICD-10-CM | POA: Diagnosis not present

## 2012-08-25 DIAGNOSIS — Z7901 Long term (current) use of anticoagulants: Secondary | ICD-10-CM | POA: Diagnosis not present

## 2012-09-11 DIAGNOSIS — N529 Male erectile dysfunction, unspecified: Secondary | ICD-10-CM | POA: Diagnosis not present

## 2012-09-14 DIAGNOSIS — Z7901 Long term (current) use of anticoagulants: Secondary | ICD-10-CM | POA: Diagnosis not present

## 2012-10-03 DIAGNOSIS — Z23 Encounter for immunization: Secondary | ICD-10-CM | POA: Diagnosis not present

## 2012-10-09 DIAGNOSIS — I495 Sick sinus syndrome: Secondary | ICD-10-CM | POA: Diagnosis not present

## 2012-10-09 DIAGNOSIS — I4891 Unspecified atrial fibrillation: Secondary | ICD-10-CM | POA: Diagnosis not present

## 2012-10-09 DIAGNOSIS — I2510 Atherosclerotic heart disease of native coronary artery without angina pectoris: Secondary | ICD-10-CM | POA: Diagnosis not present

## 2012-10-09 DIAGNOSIS — Z4502 Encounter for adjustment and management of automatic implantable cardiac defibrillator: Secondary | ICD-10-CM | POA: Diagnosis not present

## 2012-10-20 DIAGNOSIS — I5042 Chronic combined systolic (congestive) and diastolic (congestive) heart failure: Secondary | ICD-10-CM | POA: Diagnosis not present

## 2012-10-23 ENCOUNTER — Ambulatory Visit (HOSPITAL_COMMUNITY): Payer: Medicare Other

## 2012-12-21 DIAGNOSIS — Z7901 Long term (current) use of anticoagulants: Secondary | ICD-10-CM | POA: Diagnosis not present

## 2013-01-11 DIAGNOSIS — Z7901 Long term (current) use of anticoagulants: Secondary | ICD-10-CM | POA: Diagnosis not present

## 2013-01-25 DIAGNOSIS — I5032 Chronic diastolic (congestive) heart failure: Secondary | ICD-10-CM | POA: Diagnosis not present

## 2013-01-25 DIAGNOSIS — Z95810 Presence of automatic (implantable) cardiac defibrillator: Secondary | ICD-10-CM | POA: Diagnosis not present

## 2013-01-25 DIAGNOSIS — I2589 Other forms of chronic ischemic heart disease: Secondary | ICD-10-CM | POA: Diagnosis not present

## 2013-01-25 DIAGNOSIS — I4891 Unspecified atrial fibrillation: Secondary | ICD-10-CM | POA: Diagnosis not present

## 2013-01-25 DIAGNOSIS — Z79899 Other long term (current) drug therapy: Secondary | ICD-10-CM | POA: Diagnosis not present

## 2013-01-25 DIAGNOSIS — E785 Hyperlipidemia, unspecified: Secondary | ICD-10-CM | POA: Diagnosis not present

## 2013-01-25 DIAGNOSIS — I2510 Atherosclerotic heart disease of native coronary artery without angina pectoris: Secondary | ICD-10-CM | POA: Diagnosis not present

## 2013-01-25 DIAGNOSIS — I10 Essential (primary) hypertension: Secondary | ICD-10-CM | POA: Diagnosis not present

## 2013-01-31 DIAGNOSIS — C61 Malignant neoplasm of prostate: Secondary | ICD-10-CM | POA: Diagnosis not present

## 2013-02-01 ENCOUNTER — Encounter (INDEPENDENT_AMBULATORY_CARE_PROVIDER_SITE_OTHER): Payer: Self-pay | Admitting: General Surgery

## 2013-02-01 IMAGING — CR DG CHEST 2V
2 series · 2 of 2 positions shown · non-contrast
Comparison: 03/23/2010

CLINICAL DATA: Shortness of breath and fever

CHEST - 2 VIEW

[w chest lat]
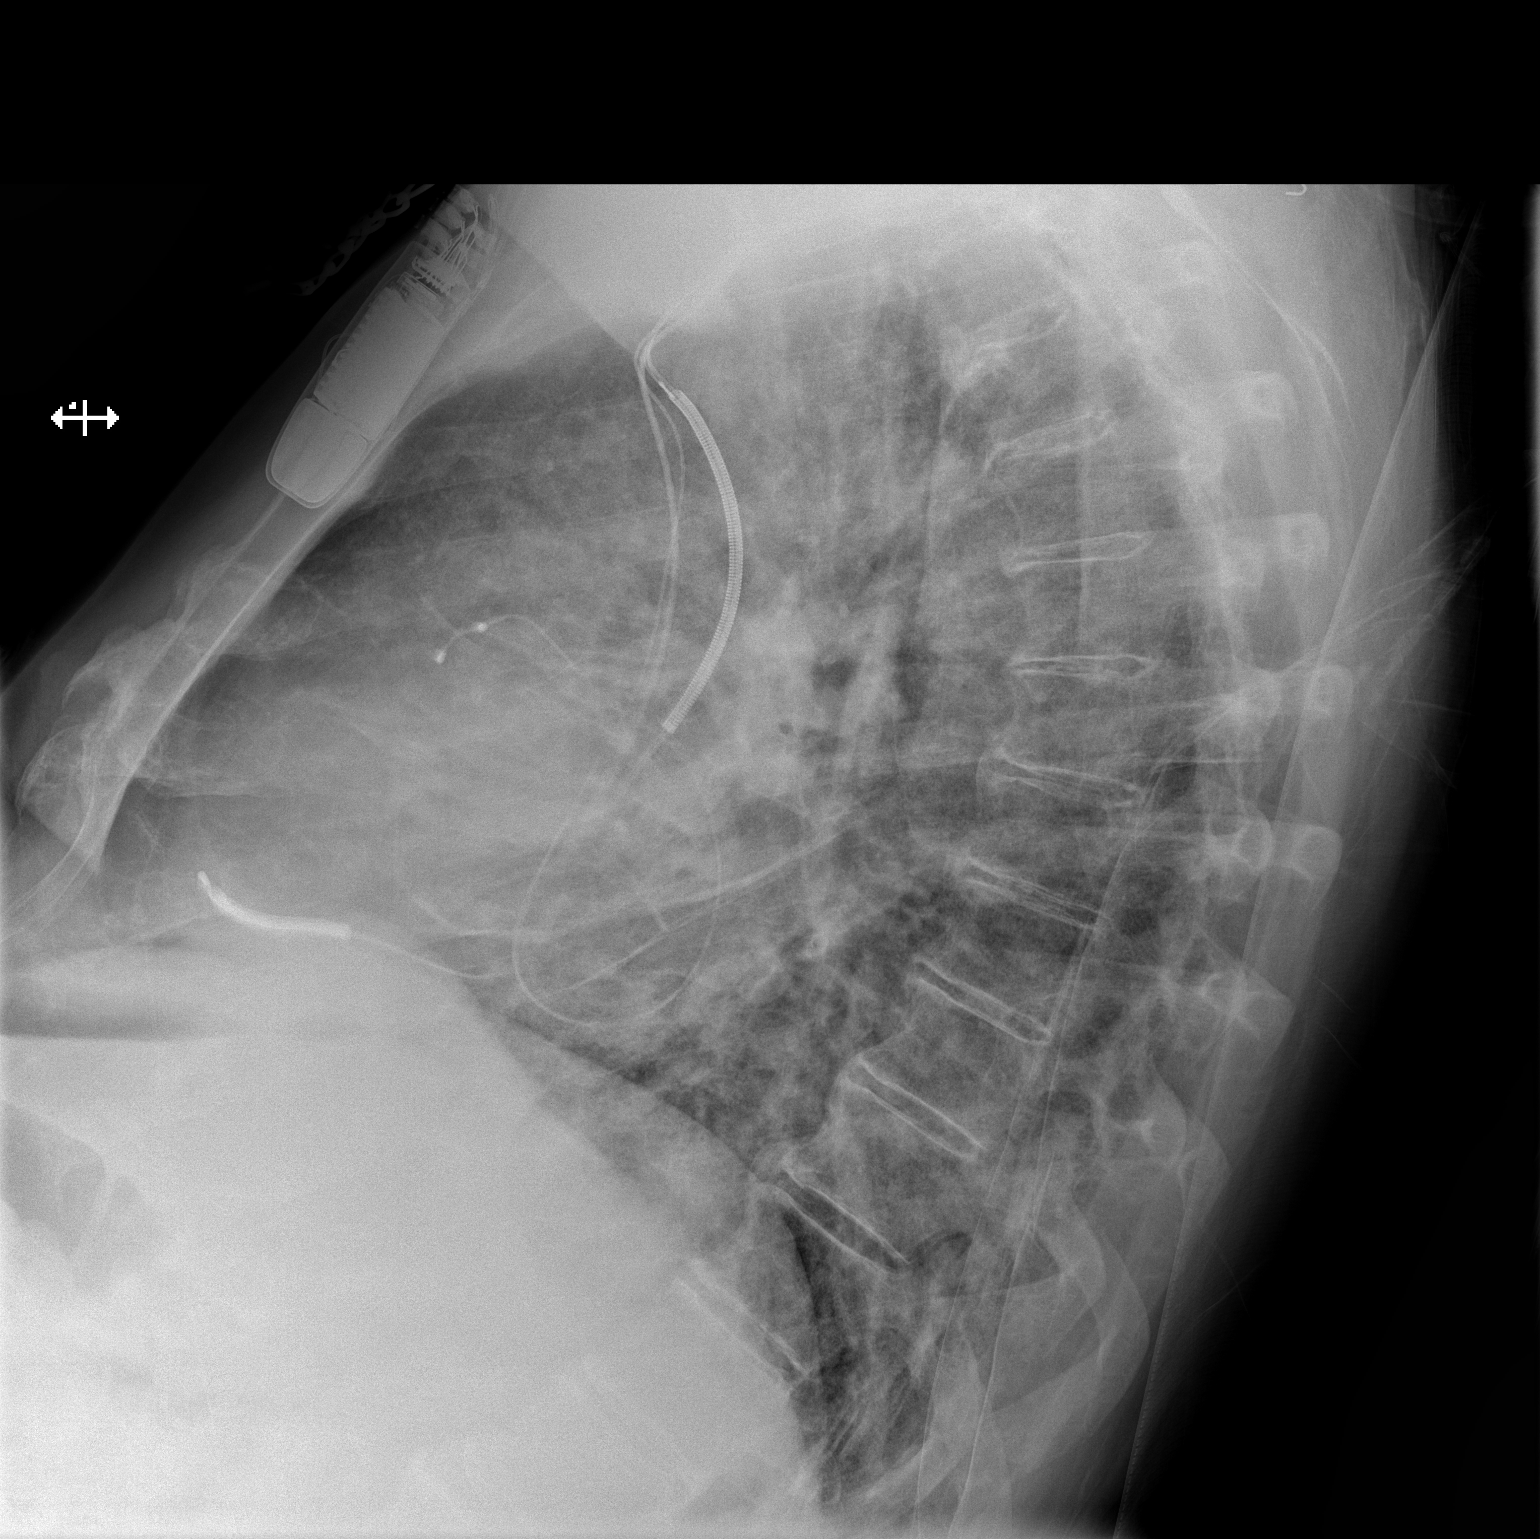

[x chest ap]
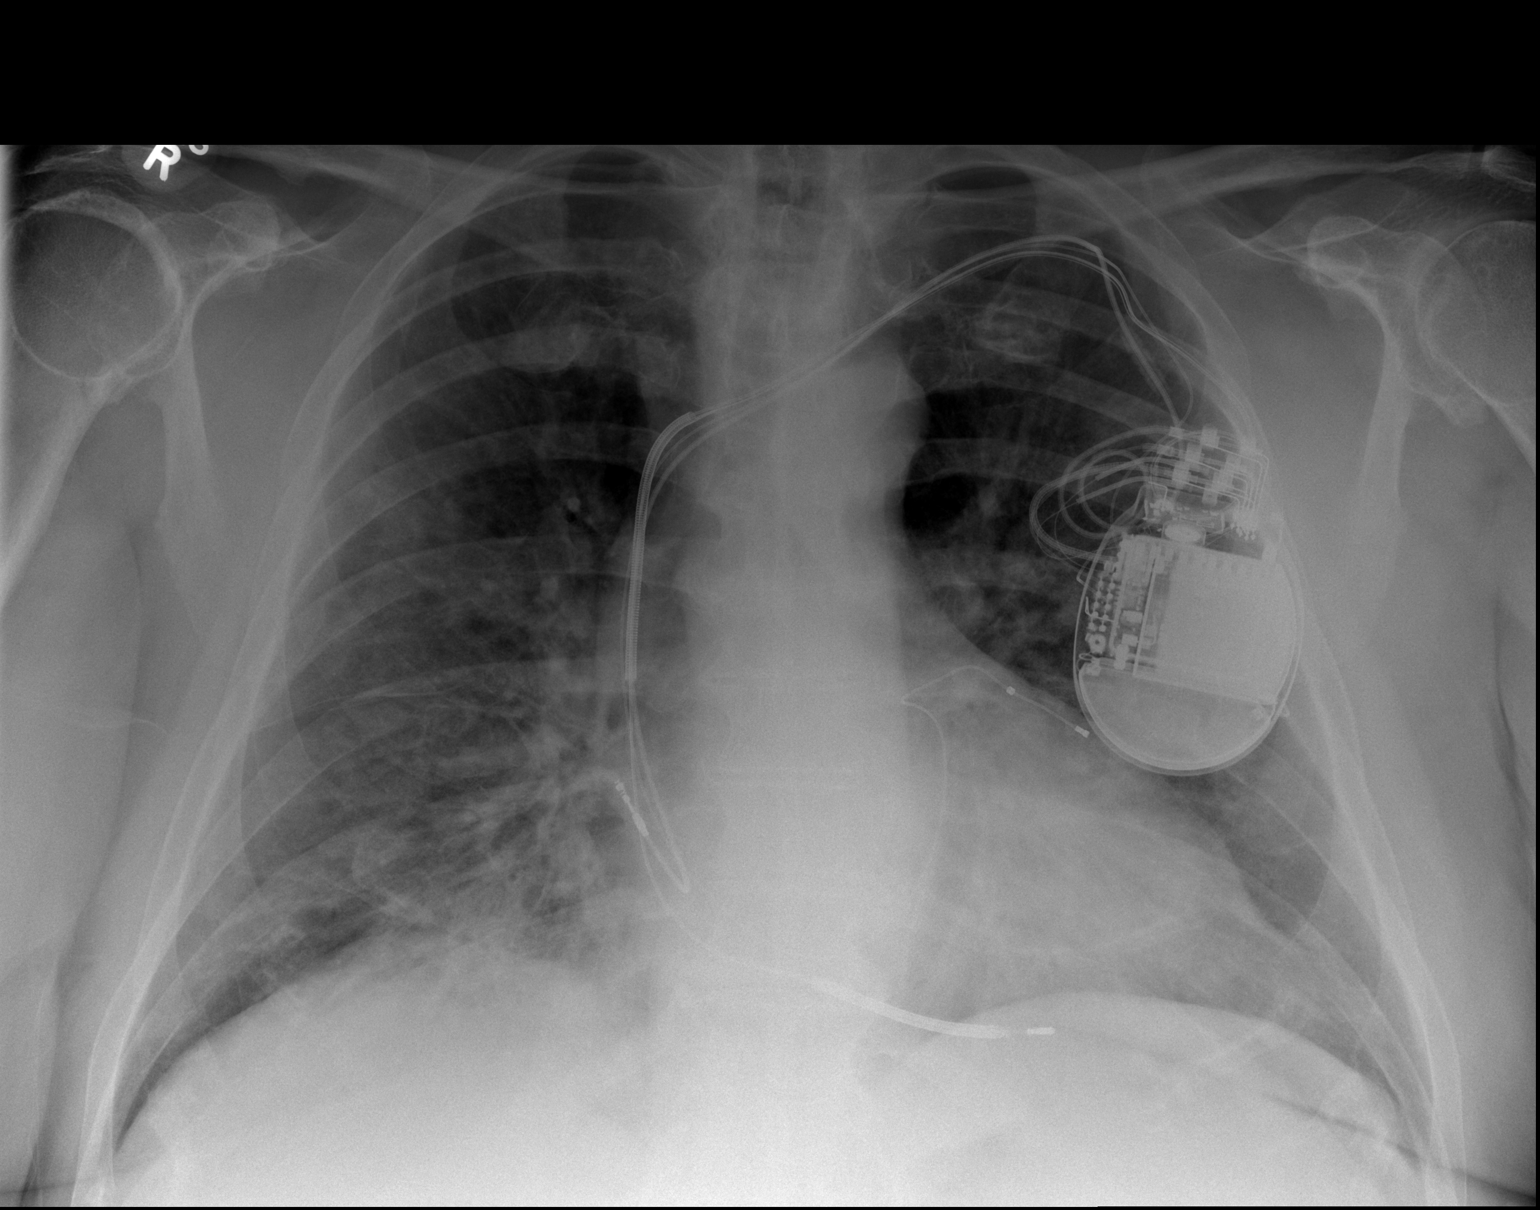

[2 of 2 positions shown; findings below may reference images not displayed]

FINDINGS: Normal heart size and pulmonary vascularity.  Stable
appearance of cardiac pacemaker.  Interval development of airspace
infiltration in the right mid and lower lungs suggesting developing
pneumonia.  No blunting of costophrenic angles.  No pneumothorax.
Degenerative changes in the thoracic spine and shoulders.
IMPRESSION: Developing airspace density on the right suggesting right lower
lobe pneumonia.

## 2013-02-01 NOTE — Progress Notes (Signed)
Faxed signed authorization by Dr. Derrell Lolling for supplies to [employer] # [phone]. Confirmation received. Order sent to medical records to be scanned into the chart.

## 2013-02-16 ENCOUNTER — Other Ambulatory Visit: Payer: Medicare Other | Admitting: Lab

## 2013-02-16 ENCOUNTER — Ambulatory Visit: Payer: Medicare Other | Admitting: Nurse Practitioner

## 2013-02-16 DIAGNOSIS — Z4502 Encounter for adjustment and management of automatic implantable cardiac defibrillator: Secondary | ICD-10-CM | POA: Diagnosis not present

## 2013-02-16 DIAGNOSIS — I2510 Atherosclerotic heart disease of native coronary artery without angina pectoris: Secondary | ICD-10-CM | POA: Diagnosis not present

## 2013-02-16 DIAGNOSIS — I4891 Unspecified atrial fibrillation: Secondary | ICD-10-CM | POA: Diagnosis not present

## 2013-02-16 DIAGNOSIS — E782 Mixed hyperlipidemia: Secondary | ICD-10-CM | POA: Diagnosis not present

## 2013-02-21 ENCOUNTER — Other Ambulatory Visit (HOSPITAL_COMMUNITY): Payer: Self-pay | Admitting: Cardiovascular Disease

## 2013-02-21 DIAGNOSIS — I714 Abdominal aortic aneurysm, without rupture: Secondary | ICD-10-CM

## 2013-02-21 DIAGNOSIS — R0989 Other specified symptoms and signs involving the circulatory and respiratory systems: Secondary | ICD-10-CM

## 2013-02-22 ENCOUNTER — Other Ambulatory Visit: Payer: Self-pay | Admitting: *Deleted

## 2013-02-23 ENCOUNTER — Telehealth: Payer: Self-pay | Admitting: Oncology

## 2013-02-23 NOTE — Telephone Encounter (Signed)
Called pt regarding appt for April 2014 no answer, will mail appt

## 2013-03-06 ENCOUNTER — Encounter (HOSPITAL_COMMUNITY): Payer: Medicare Other

## 2013-03-21 ENCOUNTER — Other Ambulatory Visit (HOSPITAL_BASED_OUTPATIENT_CLINIC_OR_DEPARTMENT_OTHER): Payer: Medicare Other

## 2013-03-21 DIAGNOSIS — C20 Malignant neoplasm of rectum: Secondary | ICD-10-CM

## 2013-03-23 ENCOUNTER — Encounter: Payer: Self-pay | Admitting: Pharmacist Clinician (PhC)/ Clinical Pharmacy Specialist

## 2013-03-23 DIAGNOSIS — I480 Paroxysmal atrial fibrillation: Secondary | ICD-10-CM | POA: Insufficient documentation

## 2013-03-23 DIAGNOSIS — Z7901 Long term (current) use of anticoagulants: Secondary | ICD-10-CM

## 2013-03-23 DIAGNOSIS — I4891 Unspecified atrial fibrillation: Secondary | ICD-10-CM

## 2013-03-26 ENCOUNTER — Encounter (HOSPITAL_COMMUNITY): Payer: Medicare Other

## 2013-03-26 ENCOUNTER — Ambulatory Visit: Payer: Medicare Other | Admitting: Nurse Practitioner

## 2013-04-09 ENCOUNTER — Telehealth: Payer: Self-pay | Admitting: Oncology

## 2013-04-09 NOTE — Telephone Encounter (Signed)
pt called to r/s missed appt.Marland KitchenMarland KitchenMarland KitchenDone

## 2013-04-17 ENCOUNTER — Ambulatory Visit (HOSPITAL_BASED_OUTPATIENT_CLINIC_OR_DEPARTMENT_OTHER): Payer: Medicare Other | Admitting: Nurse Practitioner

## 2013-04-17 ENCOUNTER — Telehealth: Payer: Self-pay | Admitting: Oncology

## 2013-04-17 VITALS — BP 110/69 | HR 65 | Temp 96.9°F | Resp 18 | Ht 70.0 in | Wt 235.9 lb

## 2013-04-17 DIAGNOSIS — C61 Malignant neoplasm of prostate: Secondary | ICD-10-CM | POA: Diagnosis not present

## 2013-04-17 DIAGNOSIS — I2589 Other forms of chronic ischemic heart disease: Secondary | ICD-10-CM | POA: Diagnosis not present

## 2013-04-17 DIAGNOSIS — E119 Type 2 diabetes mellitus without complications: Secondary | ICD-10-CM | POA: Diagnosis not present

## 2013-04-17 DIAGNOSIS — C20 Malignant neoplasm of rectum: Secondary | ICD-10-CM | POA: Diagnosis not present

## 2013-04-17 NOTE — Progress Notes (Signed)
OFFICE PROGRESS NOTE  Interval history:  Mr. Enochs returns as scheduled. He feels well. Colostomy is functioning normally. No bloody or black stools. He has a good appetite. Energy is described as "fair". He denies abdominal pain.   Objective: Blood pressure 110/69, pulse 65, temperature 96.9 F (36.1 C), temperature source Oral, resp. rate 18, height 5\' 10"  (1.778 m), weight 235 lb 14.4 oz (107.004 kg).  No thrush or ulcerations. No palpable cervical, supraclavicular or axillary lymph nodes. Question small bilateral inguinal nodes located medially, left more discrete and firmer than right  (left approximately 1/2 cm, right approximately 1 cm). Lungs clear. Regular cardiac rhythm. Abdomen soft and nontender. No organomegaly. Left lower quadrant colostomy. Perineal scar without evidence of recurrent tumor. Extremities without edema.  Lab Results: Lab Results  Component Value Date   WBC 9.3 05/02/2012   HGB 14.6 05/02/2012   HCT 43.6 05/02/2012   MCV 93.3 05/02/2012   PLT 163 05/02/2012    Chemistry:    Chemistry      Component Value Date/Time   NA 137 04/11/2012 1528   K 4.1 04/11/2012 1528   CL 101 04/11/2012 1528   CO2 23 04/11/2012 1528   BUN 18 04/11/2012 1528   CREATININE 1.15 04/11/2012 1528      Component Value Date/Time   CALCIUM 10.0 04/11/2012 1528   ALKPHOS 74 04/11/2012 1528   AST 26 04/11/2012 1528   ALT 27 04/11/2012 1528   BILITOT 0.8 04/11/2012 1528     03/21/2013 CEA 0.9  Studies/Results: No results found.  Medications: I have reviewed the patient's current medications.  Assessment/Plan:  1. Stage III rectal cancer, diagnosed in June of 2008: Status post neoadjuvant infusional 5-FU and concurrent radiation. He underwent an APR 10/04/2007 with the pathology confirming stage III disease. He completed 8 cycles of adjuvant FOLFOX therapy on 03/12/2008. A restaging CT 05/26/2010 showed no evidence of metastatic disease. He underwent a colonoscopy in October of 2012 with removal of  a single pedunculated polyp-benign pathology 2. Prostate cancer: Status post radiation and 2 years of Lupron therapy per Dr. Vonita Moss. 3. History of thrombocytopenia secondary to chemotherapy. The platelet count was normal on 05/02/12. 4. History of delayed nausea secondary to chemotherapy: Improved with Aloxi. 5. History of oxaliplatin neuropathy. 6. Ischemic cardiomyopathy followed by Dr. Alanda Amass. 7. History of bilateral axillary fullness. 8. Diabetes. 9. Status post Port-A-Cath removal. 10. Hospitalization with pneumonia October 2012. 11. Question small bilateral inguinal lymph nodes.  Disposition-Mr. Azizi appears stable. On exam he appears to have small bilateral inguinal lymph nodes. We will have him return for reevaluation in 3 months with a repeat CEA. He will contact the office in the interim with any problems.  Plan reviewed with Dr. Truett Perna.  Lonna Cobb ANP/GNP-BC

## 2013-04-17 NOTE — Telephone Encounter (Signed)
gv and printed appt sched and avs for pt for Aug °

## 2013-04-23 ENCOUNTER — Other Ambulatory Visit: Payer: Self-pay | Admitting: *Deleted

## 2013-05-05 IMAGING — CR DG CHEST 2V
2 series · 2 of 2 positions shown · non-contrast
Comparison: Chest radiograph 09/23/2011, [DATE].

CLINICAL DATA: Chest pain, shortness of breath, and cough

CHEST - 2 VIEW

[w chest pa]
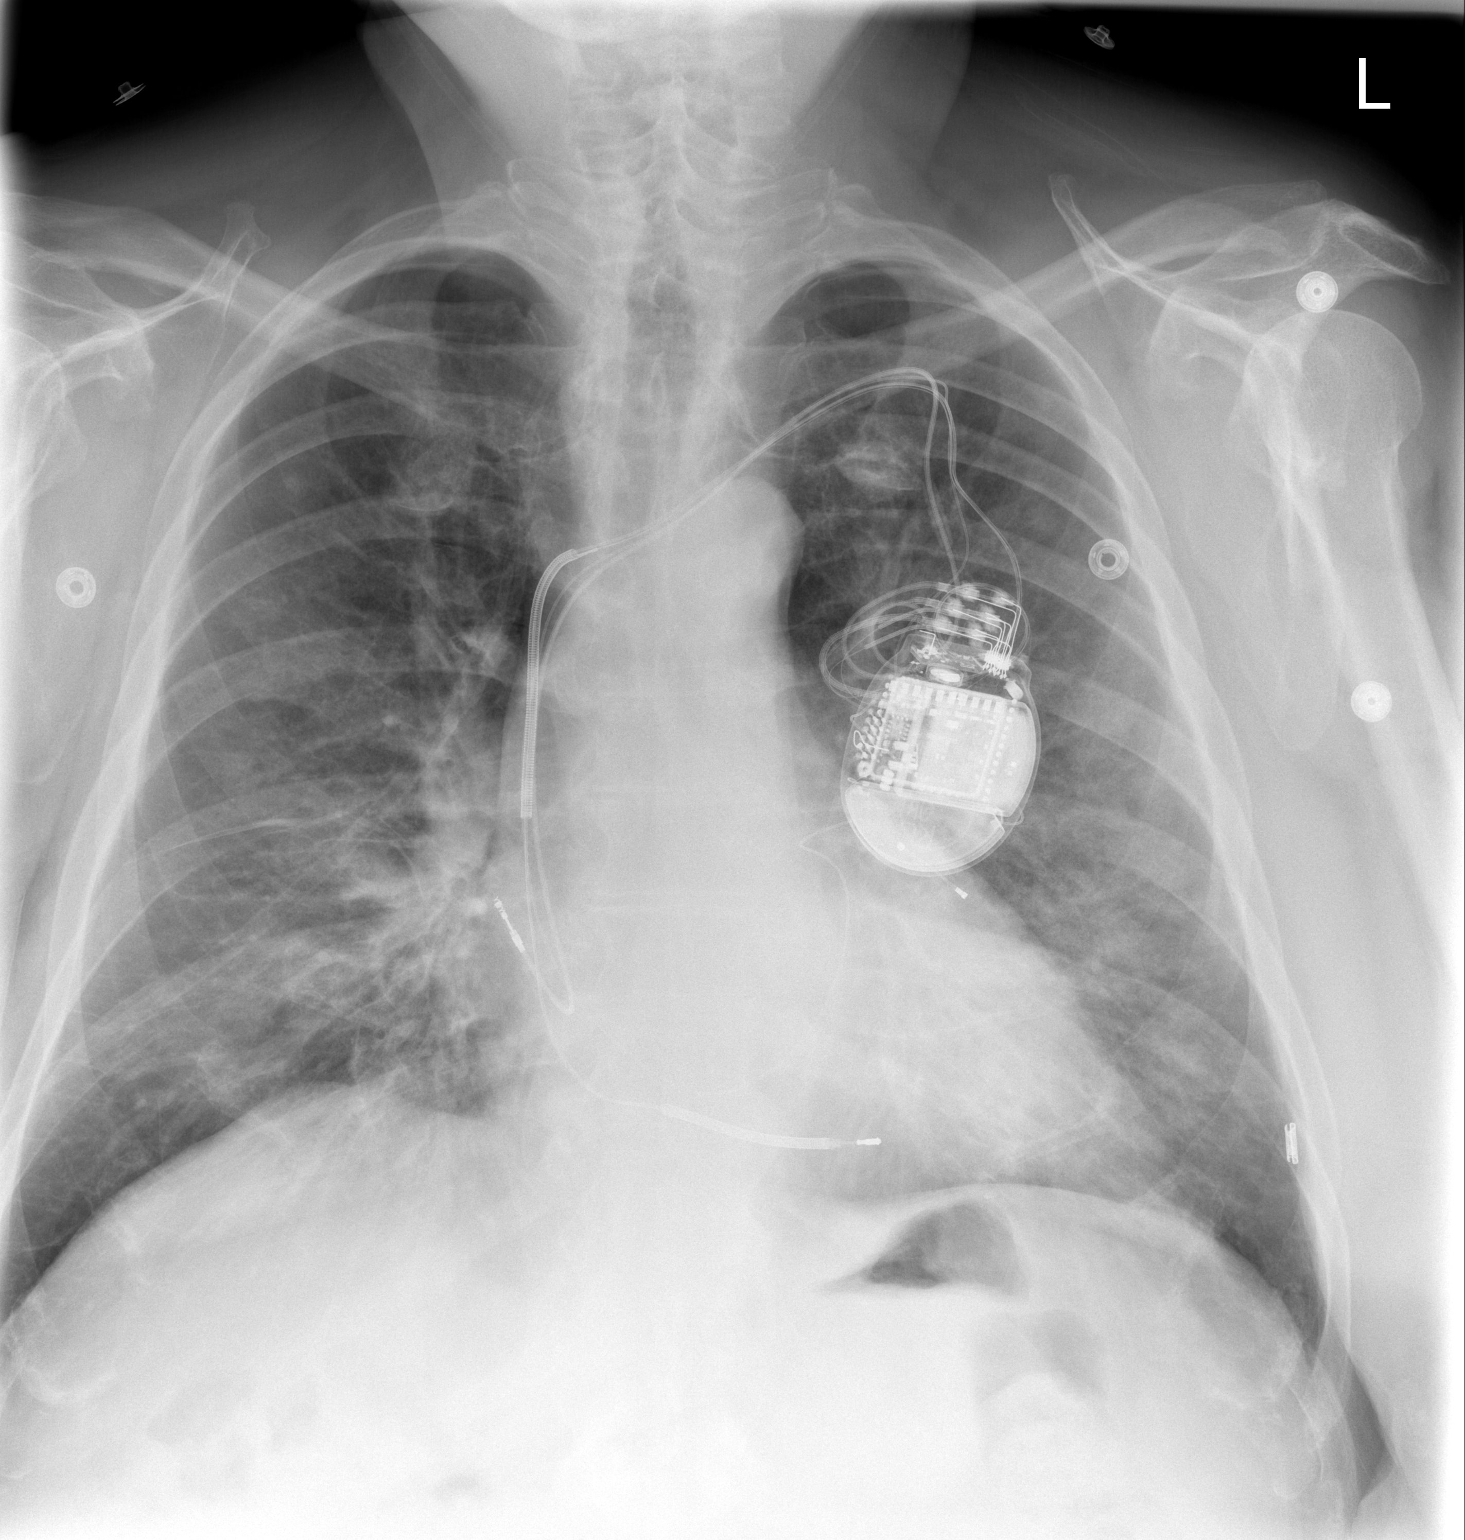

[w chest lat]
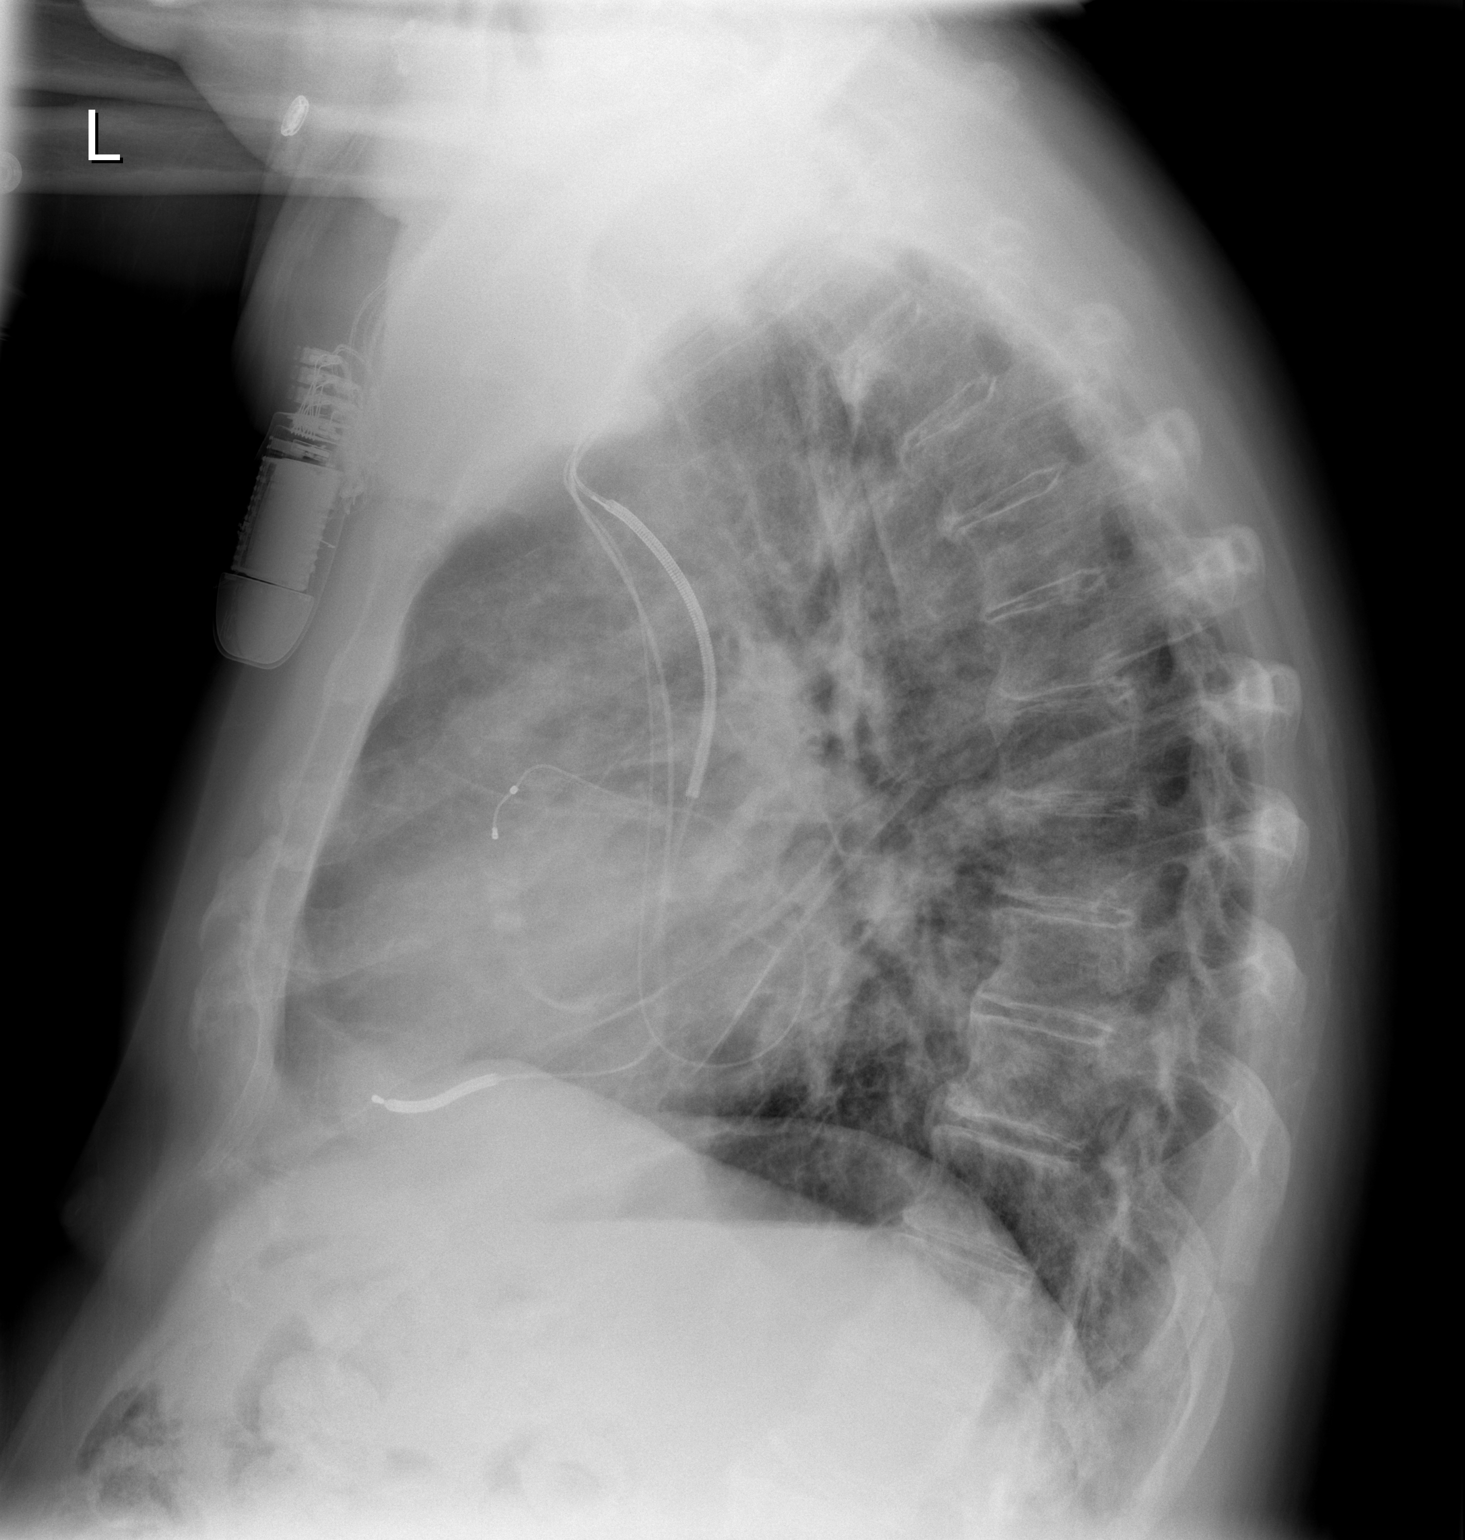

[2 of 2 positions shown; findings below may reference images not displayed]

FINDINGS: Left subclavian AICD/pacer with leads in the right
atrium, right ventricle, and coronary sinus is stable.  Mild
cardiomegaly is stable.  Pulmonary vascularity appears congested
and there is diffuse interstitial prominence.  Ker[place] B lines
noted.  No visible pleural effusion.  No acute bony abnormality
identified.
IMPRESSION: Mild cardiomegaly and interstitial pulmonary edema.

## 2013-05-28 ENCOUNTER — Other Ambulatory Visit: Payer: Self-pay

## 2013-05-28 MED ORDER — ISOSORBIDE MONONITRATE ER 30 MG PO TB24: 15.0000 mg | ORAL_TABLET | Freq: Two times a day (BID) | ORAL | Status: DC

## 2013-05-28 NOTE — Telephone Encounter (Signed)
Rx was sent to pharmacy electronically via [employer].  

## 2013-06-13 ENCOUNTER — Telehealth: Payer: Self-pay | Admitting: Cardiovascular Disease

## 2013-06-13 NOTE — Telephone Encounter (Signed)
LEFT MESSAGE TO CALL REGARDING SCHEDULING APPT 

## 2013-06-18 ENCOUNTER — Other Ambulatory Visit: Payer: Self-pay | Admitting: Cardiovascular Disease

## 2013-06-18 DIAGNOSIS — Z7901 Long term (current) use of anticoagulants: Secondary | ICD-10-CM | POA: Diagnosis not present

## 2013-06-18 DIAGNOSIS — I4891 Unspecified atrial fibrillation: Secondary | ICD-10-CM | POA: Diagnosis not present

## 2013-06-18 DIAGNOSIS — I2510 Atherosclerotic heart disease of native coronary artery without angina pectoris: Secondary | ICD-10-CM | POA: Diagnosis not present

## 2013-06-18 DIAGNOSIS — R0602 Shortness of breath: Secondary | ICD-10-CM | POA: Diagnosis not present

## 2013-06-18 DIAGNOSIS — I495 Sick sinus syndrome: Secondary | ICD-10-CM | POA: Diagnosis not present

## 2013-06-18 DIAGNOSIS — Z4502 Encounter for adjustment and management of automatic implantable cardiac defibrillator: Secondary | ICD-10-CM | POA: Diagnosis not present

## 2013-06-18 DIAGNOSIS — R5381 Other malaise: Secondary | ICD-10-CM | POA: Diagnosis not present

## 2013-06-18 DIAGNOSIS — M109 Gout, unspecified: Secondary | ICD-10-CM | POA: Diagnosis not present

## 2013-06-18 DIAGNOSIS — R6889 Other general symptoms and signs: Secondary | ICD-10-CM | POA: Diagnosis not present

## 2013-06-18 DIAGNOSIS — R5383 Other fatigue: Secondary | ICD-10-CM | POA: Diagnosis not present

## 2013-06-18 LAB — PROTIME-INR: INR: 1.53 — ABNORMAL HIGH (ref <1.50)

## 2013-06-18 LAB — CBC WITH DIFFERENTIAL/PLATELET
Basophils Absolute: 0 10*3/uL (ref 0.0–0.1)
Basophils Relative: 0 % (ref 0–1)
Eosinophils Relative: 3 % (ref 0–5)
HCT: 45.9 % (ref 39.0–52.0)
Hemoglobin: 14.8 g/dL (ref 13.0–17.0)
Lymphocytes Relative: 18 % (ref 12–46)
MCHC: 32.2 g/dL (ref 30.0–36.0)
MCV: 92.7 fL (ref 78.0–100.0)
Monocytes Absolute: 0.6 10*3/uL (ref 0.1–1.0)
Monocytes Relative: 7 % (ref 3–12)
RDW: 13.6 % (ref 11.5–15.5)

## 2013-06-19 ENCOUNTER — Encounter: Payer: Self-pay | Admitting: Cardiovascular Disease

## 2013-06-19 LAB — COMPREHENSIVE METABOLIC PANEL
ALT: 15 U/L (ref 0–53)
CO2: 23 mEq/L (ref 19–32)
Calcium: 9.6 mg/dL (ref 8.4–10.5)
Chloride: 98 mEq/L (ref 96–112)
Glucose, Bld: 347 mg/dL — ABNORMAL HIGH (ref 70–99)
Sodium: 131 mEq/L — ABNORMAL LOW (ref 135–145)
Total Bilirubin: 0.8 mg/dL (ref 0.3–1.2)
Total Protein: 6.9 g/dL (ref 6.0–8.3)

## 2013-06-19 LAB — TSH: TSH: 2.582 u[IU]/mL (ref 0.350–4.500)

## 2013-06-19 LAB — URIC ACID: Uric Acid, Serum: 6.4 mg/dL (ref 4.0–7.8)

## 2013-06-21 ENCOUNTER — Telehealth: Payer: Self-pay | Admitting: Cardiovascular Disease

## 2013-06-21 ENCOUNTER — Ambulatory Visit (INDEPENDENT_AMBULATORY_CARE_PROVIDER_SITE_OTHER): Payer: Self-pay | Admitting: Pharmacist Clinician (PhC)/ Clinical Pharmacy Specialist

## 2013-06-21 DIAGNOSIS — Z7901 Long term (current) use of anticoagulants: Secondary | ICD-10-CM

## 2013-06-21 DIAGNOSIS — I4891 Unspecified atrial fibrillation: Secondary | ICD-10-CM

## 2013-06-21 NOTE — Telephone Encounter (Signed)
Mr.Mottram is wanting know his results to make sur his dosage and everything is right .Marland Kitchen Please can leave a message .Marland Kitchen

## 2013-07-02 DIAGNOSIS — Z4502 Encounter for adjustment and management of automatic implantable cardiac defibrillator: Secondary | ICD-10-CM | POA: Diagnosis not present

## 2013-07-02 DIAGNOSIS — I495 Sick sinus syndrome: Secondary | ICD-10-CM | POA: Diagnosis not present

## 2013-07-02 DIAGNOSIS — I2510 Atherosclerotic heart disease of native coronary artery without angina pectoris: Secondary | ICD-10-CM | POA: Diagnosis not present

## 2013-07-02 DIAGNOSIS — I4891 Unspecified atrial fibrillation: Secondary | ICD-10-CM | POA: Diagnosis not present

## 2013-07-05 DIAGNOSIS — I5022 Chronic systolic (congestive) heart failure: Secondary | ICD-10-CM | POA: Diagnosis not present

## 2013-07-05 DIAGNOSIS — E785 Hyperlipidemia, unspecified: Secondary | ICD-10-CM | POA: Diagnosis not present

## 2013-07-05 DIAGNOSIS — I252 Old myocardial infarction: Secondary | ICD-10-CM | POA: Diagnosis not present

## 2013-07-05 DIAGNOSIS — R0609 Other forms of dyspnea: Secondary | ICD-10-CM | POA: Diagnosis not present

## 2013-07-05 DIAGNOSIS — I4891 Unspecified atrial fibrillation: Secondary | ICD-10-CM | POA: Diagnosis not present

## 2013-07-05 DIAGNOSIS — R0989 Other specified symptoms and signs involving the circulatory and respiratory systems: Secondary | ICD-10-CM | POA: Diagnosis not present

## 2013-07-05 DIAGNOSIS — I2510 Atherosclerotic heart disease of native coronary artery without angina pectoris: Secondary | ICD-10-CM | POA: Diagnosis not present

## 2013-07-05 DIAGNOSIS — Z85038 Personal history of other malignant neoplasm of large intestine: Secondary | ICD-10-CM | POA: Diagnosis not present

## 2013-07-05 DIAGNOSIS — I10 Essential (primary) hypertension: Secondary | ICD-10-CM | POA: Diagnosis not present

## 2013-07-05 DIAGNOSIS — Z79899 Other long term (current) drug therapy: Secondary | ICD-10-CM | POA: Diagnosis not present

## 2013-07-05 DIAGNOSIS — I509 Heart failure, unspecified: Secondary | ICD-10-CM | POA: Diagnosis not present

## 2013-07-09 ENCOUNTER — Telehealth: Payer: Self-pay | Admitting: Cardiovascular Disease

## 2013-07-09 ENCOUNTER — Encounter (HOSPITAL_COMMUNITY): Payer: Medicare Other

## 2013-07-10 ENCOUNTER — Inpatient Hospital Stay (HOSPITAL_COMMUNITY)
Admission: EM | Admit: 2013-07-10 | Discharge: 2013-07-14 | DRG: 286 | Disposition: A | Discharge status: Home / Self Care | Payer: Medicare Other | Attending: Cardiovascular Disease | Admitting: Cardiovascular Disease

## 2013-07-10 ENCOUNTER — Emergency Department (HOSPITAL_COMMUNITY): Payer: Medicare Other

## 2013-07-10 ENCOUNTER — Encounter (HOSPITAL_COMMUNITY): Payer: Self-pay | Admitting: *Deleted

## 2013-07-10 DIAGNOSIS — I2584 Coronary atherosclerosis due to calcified coronary lesion: Secondary | ICD-10-CM | POA: Diagnosis present

## 2013-07-10 DIAGNOSIS — I480 Paroxysmal atrial fibrillation: Secondary | ICD-10-CM | POA: Diagnosis present

## 2013-07-10 DIAGNOSIS — Z923 Personal history of irradiation: Secondary | ICD-10-CM | POA: Diagnosis not present

## 2013-07-10 DIAGNOSIS — Z800 Family history of malignant neoplasm of digestive organs: Secondary | ICD-10-CM

## 2013-07-10 DIAGNOSIS — E785 Hyperlipidemia, unspecified: Secondary | ICD-10-CM | POA: Diagnosis present

## 2013-07-10 DIAGNOSIS — R079 Chest pain, unspecified: Secondary | ICD-10-CM | POA: Diagnosis not present

## 2013-07-10 DIAGNOSIS — T82897A Other specified complication of cardiac prosthetic devices, implants and grafts, initial encounter: Secondary | ICD-10-CM | POA: Diagnosis present

## 2013-07-10 DIAGNOSIS — I200 Unstable angina: Secondary | ICD-10-CM | POA: Diagnosis present

## 2013-07-10 DIAGNOSIS — R072 Precordial pain: Secondary | ICD-10-CM | POA: Diagnosis not present

## 2013-07-10 DIAGNOSIS — Z7901 Long term (current) use of anticoagulants: Secondary | ICD-10-CM | POA: Diagnosis not present

## 2013-07-10 DIAGNOSIS — E119 Type 2 diabetes mellitus without complications: Secondary | ICD-10-CM | POA: Diagnosis present

## 2013-07-10 DIAGNOSIS — I959 Hypotension, unspecified: Secondary | ICD-10-CM | POA: Diagnosis present

## 2013-07-10 DIAGNOSIS — J40 Bronchitis, not specified as acute or chronic: Secondary | ICD-10-CM | POA: Diagnosis not present

## 2013-07-10 DIAGNOSIS — Z6833 Body mass index (BMI) 33.0-33.9, adult: Secondary | ICD-10-CM

## 2013-07-10 DIAGNOSIS — I2589 Other forms of chronic ischemic heart disease: Secondary | ICD-10-CM | POA: Diagnosis present

## 2013-07-10 DIAGNOSIS — Z8249 Family history of ischemic heart disease and other diseases of the circulatory system: Secondary | ICD-10-CM

## 2013-07-10 DIAGNOSIS — Z85048 Personal history of other malignant neoplasm of rectum, rectosigmoid junction, and anus: Secondary | ICD-10-CM

## 2013-07-10 DIAGNOSIS — I4891 Unspecified atrial fibrillation: Secondary | ICD-10-CM | POA: Diagnosis not present

## 2013-07-10 DIAGNOSIS — I252 Old myocardial infarction: Secondary | ICD-10-CM | POA: Diagnosis not present

## 2013-07-10 DIAGNOSIS — K219 Gastro-esophageal reflux disease without esophagitis: Secondary | ICD-10-CM | POA: Diagnosis present

## 2013-07-10 DIAGNOSIS — N289 Disorder of kidney and ureter, unspecified: Secondary | ICD-10-CM | POA: Diagnosis present

## 2013-07-10 DIAGNOSIS — I4510 Unspecified right bundle-branch block: Secondary | ICD-10-CM | POA: Diagnosis present

## 2013-07-10 DIAGNOSIS — I5023 Acute on chronic systolic (congestive) heart failure: Secondary | ICD-10-CM | POA: Diagnosis present

## 2013-07-10 DIAGNOSIS — E669 Obesity, unspecified: Secondary | ICD-10-CM | POA: Diagnosis present

## 2013-07-10 DIAGNOSIS — I10 Essential (primary) hypertension: Secondary | ICD-10-CM | POA: Diagnosis present

## 2013-07-10 DIAGNOSIS — I509 Heart failure, unspecified: Secondary | ICD-10-CM | POA: Diagnosis present

## 2013-07-10 DIAGNOSIS — Z9861 Coronary angioplasty status: Secondary | ICD-10-CM | POA: Diagnosis not present

## 2013-07-10 DIAGNOSIS — I219 Acute myocardial infarction, unspecified: Secondary | ICD-10-CM

## 2013-07-10 DIAGNOSIS — Z95810 Presence of automatic (implantable) cardiac defibrillator: Secondary | ICD-10-CM

## 2013-07-10 DIAGNOSIS — Y831 Surgical operation with implant of artificial internal device as the cause of abnormal reaction of the patient, or of later complication, without mention of misadventure at the time of the procedure: Secondary | ICD-10-CM | POA: Diagnosis present

## 2013-07-10 DIAGNOSIS — Z8546 Personal history of malignant neoplasm of prostate: Secondary | ICD-10-CM

## 2013-07-10 DIAGNOSIS — I2510 Atherosclerotic heart disease of native coronary artery without angina pectoris: Secondary | ICD-10-CM | POA: Diagnosis not present

## 2013-07-10 DIAGNOSIS — E875 Hyperkalemia: Secondary | ICD-10-CM | POA: Diagnosis not present

## 2013-07-10 DIAGNOSIS — I255 Ischemic cardiomyopathy: Secondary | ICD-10-CM | POA: Diagnosis present

## 2013-07-10 DIAGNOSIS — Z9221 Personal history of antineoplastic chemotherapy: Secondary | ICD-10-CM

## 2013-07-10 DIAGNOSIS — R0602 Shortness of breath: Secondary | ICD-10-CM | POA: Diagnosis not present

## 2013-07-10 LAB — PRO B NATRIURETIC PEPTIDE: Pro B Natriuretic peptide (BNP): 2585 pg/mL — ABNORMAL HIGH (ref 0–125)

## 2013-07-10 LAB — COMPREHENSIVE METABOLIC PANEL
AST: 27 U/L (ref 0–37)
Albumin: 3.2 g/dL — ABNORMAL LOW (ref 3.5–5.2)
Alkaline Phosphatase: 78 U/L (ref 39–117)
BUN: 29 mg/dL — ABNORMAL HIGH (ref 6–23)
Chloride: 97 mEq/L (ref 96–112)
Creatinine, Ser: 1.78 mg/dL — ABNORMAL HIGH (ref 0.50–1.35)
Potassium: 5.2 mEq/L — ABNORMAL HIGH (ref 3.5–5.1)
Total Bilirubin: 0.9 mg/dL (ref 0.3–1.2)
Total Protein: 7 g/dL (ref 6.0–8.3)

## 2013-07-10 LAB — TROPONIN I: Troponin I: <0.3 ng/mL (ref <0.30)

## 2013-07-10 LAB — CBC WITH DIFFERENTIAL/PLATELET
Basophils Absolute: 0 10*3/uL (ref 0.0–0.1)
Basophils Relative: 0 % (ref 0–1)
Eosinophils Absolute: 0.1 10*3/uL (ref 0.0–0.7)
MCH: 31.3 pg (ref 26.0–34.0)
MCHC: 34.5 g/dL (ref 30.0–36.0)
Monocytes Relative: 8 % (ref 3–12)
Neutro Abs: 7.3 10*3/uL (ref 1.7–7.7)
Neutrophils Relative %: 77 % (ref 43–77)
RDW: 14.1 % (ref 11.5–15.5)

## 2013-07-10 LAB — PROTIME-INR
INR: 2.71 — ABNORMAL HIGH (ref 0.00–1.49)
Prothrombin Time: 27.8 seconds — ABNORMAL HIGH (ref 11.6–15.2)

## 2013-07-10 LAB — POTASSIUM
Potassium: 6 mEq/L — ABNORMAL HIGH (ref 3.5–5.1)
Potassium: 4.6 mEq/L (ref 3.5–5.1)

## 2013-07-10 LAB — POCT I-STAT TROPONIN I

## 2013-07-10 MED ORDER — SODIUM CHLORIDE 0.9 % IV BOLUS (SEPSIS)
500.0000 mL | INTRAVENOUS | Status: AC
  Administered 2013-07-10: 500 mL via INTRAVENOUS

## 2013-07-10 MED ORDER — ACETAMINOPHEN 325 MG PO TABS: 650.0000 mg | ORAL_TABLET | ORAL | Status: DC | PRN

## 2013-07-10 MED ORDER — ISOSORBIDE MONONITRATE 15 MG HALF TABLET
15.0000 mg | ORAL_TABLET | Freq: Two times a day (BID) | ORAL | Status: DC
  Administered 2013-07-10 – 2013-07-11 (×3): 15 mg via ORAL
  Filled 2013-07-10 (×5): qty 1

## 2013-07-10 MED ORDER — ASPIRIN EC 81 MG PO TBEC
81.0000 mg | DELAYED_RELEASE_TABLET | Freq: Every day | ORAL | Status: DC
  Filled 2013-07-10: qty 1

## 2013-07-10 MED ORDER — FUROSEMIDE 10 MG/ML IJ SOLN
40.0000 mg | Freq: Once | INTRAMUSCULAR | Status: AC
  Administered 2013-07-10: 40 mg via INTRAVENOUS
  Filled 2013-07-10: qty 4

## 2013-07-10 MED ORDER — SPIRONOLACTONE 12.5 MG HALF TABLET
12.5000 mg | ORAL_TABLET | Freq: Every day | ORAL | Status: DC
  Administered 2013-07-11 – 2013-07-14 (×4): 12.5 mg via ORAL
  Filled 2013-07-10 (×4): qty 1

## 2013-07-10 MED ORDER — OMEGA-3-ACID ETHYL ESTERS 1 G PO CAPS
1.0000 g | ORAL_CAPSULE | Freq: Two times a day (BID) | ORAL | Status: DC
  Administered 2013-07-10 – 2013-07-14 (×7): 1 g via ORAL
  Filled 2013-07-10 (×9): qty 1

## 2013-07-10 MED ORDER — CITALOPRAM HYDROBROMIDE 10 MG PO TABS
10.0000 mg | ORAL_TABLET | Freq: Every day | ORAL | Status: DC
  Administered 2013-07-11 – 2013-07-14 (×4): 10 mg via ORAL
  Filled 2013-07-10 (×4): qty 1

## 2013-07-10 MED ORDER — ENALAPRIL MALEATE 10 MG PO TABS
10.0000 mg | ORAL_TABLET | Freq: Two times a day (BID) | ORAL | Status: DC
  Administered 2013-07-10: 10 mg via ORAL
  Filled 2013-07-10 (×3): qty 1

## 2013-07-10 MED ORDER — PANTOPRAZOLE SODIUM 40 MG PO TBEC
40.0000 mg | DELAYED_RELEASE_TABLET | Freq: Every day | ORAL | Status: DC
  Administered 2013-07-11 – 2013-07-14 (×4): 40 mg via ORAL
  Filled 2013-07-10 (×3): qty 1

## 2013-07-10 MED ORDER — NITROGLYCERIN 0.4 MG SL SUBL: 0.4000 mg | SUBLINGUAL_TABLET | SUBLINGUAL | Status: DC | PRN

## 2013-07-10 MED ORDER — ASPIRIN 325 MG PO TABS
325.0000 mg | ORAL_TABLET | ORAL | Status: AC
  Administered 2013-07-10: 325 mg via ORAL
  Filled 2013-07-10: qty 1

## 2013-07-10 MED ORDER — ONDANSETRON HCL 4 MG/2ML IJ SOLN: 4.0000 mg | Freq: Four times a day (QID) | INTRAMUSCULAR | Status: DC | PRN

## 2013-07-10 MED ORDER — INSULIN GLARGINE 100 UNIT/ML ~~LOC~~ SOLN
15.0000 [IU] | Freq: Every day | SUBCUTANEOUS | Status: DC
  Administered 2013-07-10 – 2013-07-12 (×3): 15 [IU] via SUBCUTANEOUS
  Filled 2013-07-10 (×4): qty 0.15

## 2013-07-10 MED ORDER — RANOLAZINE ER 500 MG PO TB12
500.0000 mg | ORAL_TABLET | Freq: Two times a day (BID) | ORAL | Status: DC
  Administered 2013-07-10 – 2013-07-14 (×7): 500 mg via ORAL
  Filled 2013-07-10 (×9): qty 1

## 2013-07-10 MED ORDER — ZOLPIDEM TARTRATE 5 MG PO TABS: 5.0000 mg | ORAL_TABLET | Freq: Every evening | ORAL | Status: DC | PRN

## 2013-07-10 MED ORDER — ALPRAZOLAM 0.25 MG PO TABS
0.2500 mg | ORAL_TABLET | Freq: Two times a day (BID) | ORAL | Status: DC | PRN
  Administered 2013-07-14: 04:00:00 0.25 mg via ORAL
  Filled 2013-07-10: qty 1

## 2013-07-10 MED ORDER — INSULIN ASPART 100 UNIT/ML ~~LOC~~ SOLN
0.0000 [IU] | Freq: Three times a day (TID) | SUBCUTANEOUS | Status: DC
  Administered 2013-07-11: 5 [IU] via SUBCUTANEOUS
  Administered 2013-07-11 (×2): 3 [IU] via SUBCUTANEOUS
  Administered 2013-07-12: 2 [IU] via SUBCUTANEOUS

## 2013-07-10 MED ORDER — INSULIN ASPART 100 UNIT/ML ~~LOC~~ SOLN
8.0000 [IU] | Freq: Three times a day (TID) | SUBCUTANEOUS | Status: DC
  Administered 2013-07-11: 8 [IU] via SUBCUTANEOUS
  Administered 2013-07-11 (×2): via SUBCUTANEOUS
  Administered 2013-07-12 – 2013-07-14 (×5): 8 [IU] via SUBCUTANEOUS

## 2013-07-10 MED ORDER — AMIODARONE HCL 200 MG PO TABS
200.0000 mg | ORAL_TABLET | Freq: Two times a day (BID) | ORAL | Status: DC
  Administered 2013-07-11 – 2013-07-14 (×6): 200 mg via ORAL
  Filled 2013-07-10 (×9): qty 1

## 2013-07-10 MED ORDER — INSULIN ASPART 100 UNIT/ML ~~LOC~~ SOLN: 8.0000 [IU] | Freq: Three times a day (TID) | SUBCUTANEOUS | Status: DC

## 2013-07-10 MED ORDER — NIACIN ER (ANTIHYPERLIPIDEMIC) 500 MG PO TBCR
1000.0000 mg | EXTENDED_RELEASE_TABLET | Freq: Every day | ORAL | Status: DC
  Administered 2013-07-11 – 2013-07-13 (×3): 1000 mg via ORAL
  Filled 2013-07-10 (×4): qty 2

## 2013-07-10 MED ORDER — METOPROLOL SUCCINATE ER 100 MG PO TB24
200.0000 mg | ORAL_TABLET | Freq: Every day | ORAL | Status: DC
  Administered 2013-07-11 – 2013-07-14 (×4): 200 mg via ORAL
  Filled 2013-07-10 (×4): qty 2

## 2013-07-10 MED ORDER — DIGOXIN 125 MCG PO TABS
125.0000 ug | ORAL_TABLET | ORAL | Status: DC
  Administered 2013-07-12 – 2013-07-14 (×2): 125 ug via ORAL
  Filled 2013-07-10 (×2): qty 1

## 2013-07-10 MED ORDER — ATORVASTATIN CALCIUM 40 MG PO TABS
40.0000 mg | ORAL_TABLET | Freq: Every day | ORAL | Status: DC
  Administered 2013-07-11 – 2013-07-13 (×3): 40 mg via ORAL
  Filled 2013-07-10 (×4): qty 1

## 2013-07-10 NOTE — ED Provider Notes (Signed)
CSN: [id number]     Arrival date & time 07/10/13  1029 History     First MD Initiated Contact with Patient 07/10/13 1103     Chief Complaint  Patient presents with  . Shortness of Breath  . Chest Pain   (Consider location/radiation/quality/duration/timing/severity/associated sxs/prior Treatment) Patient is a 57 y.o. male presenting with chest pain. The history is provided by the patient.  Chest Pain Pain location:  Substernal area Pain quality: tightness   Pain radiates to:  Does not radiate Pain radiates to the back: no   Pain severity:  Mild Onset quality:  Gradual Duration:  2 days Timing:  Constant Progression:  Unchanged Chronicity:  New Context: at rest   Relieved by:  Nothing Worsened by:  Nothing tried Ineffective treatments:  None tried Associated symptoms: cough, shortness of breath and vomiting (3 times lin last 2-3 weeks)   Associated symptoms: no abdominal pain, no fever, no headache, no nausea and no numbness   Shortness of breath:    Severity:  Mild   Onset quality:  Gradual   Duration:  2 days   Timing:  Constant   Progression:  Unchanged   Past Medical History  Diagnosis Date  . Unspecified essential hypertension   . Type II or unspecified type diabetes mellitus without mention of complication, not stated as uncontrolled   . 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 CAD (coronary artery disease)     hx Ant MI 2000 with stent to LAD,  stent to LAD again in 2009. last cath 2009  . S/P colonoscopy 08/26/2008    Normal via ostomy  . CHF (congestive heart failure)   . Acute myocardial infarction, unspecified site, episode of care unspecified 2000  . 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 Prostate cancer   . GERD (gastroesophageal reflux disease)   . Cardiomyopathy, ischemic     with Hx of BIV ICD St. Jude EF 23%   Past Surgical History  Procedure Laterality Date  . Internal defibrillator and pacemaker   2010    x2 St. Jude device  . Abdominal and perineal resection of rectum with total mesorectal excision      10/04/2007  . Colonoscopy      05/17/2007. IMPRESSION: Semilunar, apple-core neoplasm low in the rectum (palpable on digital rectal exam) beginning at 5 cm and corkscrewing up 5 cm in length. This was a low rectal lesion consistent with colorectal carcinoma. It was biosied multiple times. The upstream colon all the way to the cecum appeared normal. Recommendations: Followup on path. Surgical Consultation   . Coronary angioplasty  2000. 2009    with stents  . Colonoscopy  09/14/2011    Procedure: COLONOSCOPY;  Surgeon: Corbin Ade, MD;  [facility]: [facility];  Service: Endoscopy;  Laterality: N/A;  8:30- TCS via colostomy & pt has defibrillator   Family History  Problem Relation Age of Onset  . Colon cancer Mother 54  . Colon cancer Sister 25  . Coronary artery disease Father    History  Substance Use Topics  . Smoking status: Never Smoker   . Smokeless tobacco: Not on file  . Alcohol Use: No     Comment: former user    Review of Systems  Constitutional: Negative for fever.  HENT: Negative for rhinorrhea, drooling and neck pain.   Eyes: Negative for pain.  Respiratory: Positive for cough and shortness of breath.   Cardiovascular: Positive for  chest pain. Negative for leg swelling.  Gastrointestinal: Positive for vomiting (3 times lin last 2-3 weeks). Negative for nausea, abdominal pain and diarrhea.  Genitourinary: Negative for dysuria and hematuria.  Musculoskeletal: Negative for gait problem.  Skin: Negative for color change.  Neurological: Negative for numbness and headaches.  Hematological: Negative for adenopathy.  Psychiatric/Behavioral: Negative for behavioral problems.  All other systems reviewed and are negative.    Allergies  Review of patient's allergies indicates no known allergies.  Home Medications   No current outpatient prescriptions on  file. BP 80/59  Pulse 98  Temp(Src) 98.1 F (36.7 C) (Oral)  Resp 18  Ht 5\' 10"  (1.778 m)  Wt 233 lb 12.8 oz (106.051 kg)  BMI 33.55 kg/m2  SpO2 96% Physical Exam  Nursing note and vitals reviewed. Constitutional: He is oriented to person, place, and time. He appears well-developed and well-nourished.  HENT:  Head: Normocephalic and atraumatic.  Right Ear: External ear normal.  Left Ear: External ear normal.  Nose: Nose normal.  Mouth/Throat: Oropharynx is clear and moist. No oropharyngeal exudate.  Eyes: Conjunctivae and EOM are normal. Pupils are equal, round, and reactive to light.  Neck: Normal range of motion. Neck supple.  Cardiovascular: Normal rate, normal heart sounds and intact distal pulses.  Exam reveals no gallop and no friction rub.   No murmur heard. A fib  Pulmonary/Chest: Effort normal and breath sounds normal. No respiratory distress. He has no wheezes.  Abdominal: Soft. Bowel sounds are normal. He exhibits no distension. There is no tenderness. There is no rebound and no guarding.  Ostomy appears clean. No erythema or ttp.   Musculoskeletal: Normal range of motion. He exhibits no edema and no tenderness.  Neurological: He is alert and oriented to person, place, and time.  Skin: Skin is warm and dry.  Psychiatric: He has a normal mood and affect. His behavior is normal.    ED Course   Procedures (including critical care time)  Labs Reviewed  PRO B NATRIURETIC PEPTIDE - Abnormal; Notable for the following:    Pro B Natriuretic peptide (BNP) 2585.0 (*)    All other components within normal limits  COMPREHENSIVE METABOLIC PANEL - Abnormal; Notable for the following:    Sodium 132 (*)    Potassium 5.2 (*)    Glucose, Bld 378 (*)    BUN 29 (*)    Creatinine, Ser 1.78 (*)    Albumin 3.2 (*)    GFR calc non Af Amer 40 (*)    GFR calc Af Amer 47 (*)    All other components within normal limits  PROTIME-INR - Abnormal; Notable for the following:     Prothrombin Time 27.8 (*)    INR 2.71 (*)    All other components within normal limits  POTASSIUM - Abnormal; Notable for the following:    Potassium 6.0 (*)    All other components within normal limits  PROTIME-INR - Abnormal; Notable for the following:    Prothrombin Time 29.6 (*)    INR 2.94 (*)    All other components within normal limits  DIGOXIN LEVEL - Abnormal; Notable for the following:    Digoxin Level <0.3 (*)    All other components within normal limits  HEMOGLOBIN A1C - Abnormal; Notable for the following:    Hemoglobin A1C 11.1 (*)    Mean Plasma Glucose 272 (*)    All other components within normal limits  LIPID PANEL - Abnormal; Notable for the following:  Cholesterol 238 (*)    Triglycerides 252 (*)    HDL 27 (*)    VLDL 50 (*)    LDL Cholesterol 161 (*)    All other components within normal limits  BASIC METABOLIC PANEL - Abnormal; Notable for the following:    Glucose, Bld 260 (*)    BUN 29 (*)    Creatinine, Ser 1.82 (*)    GFR calc non Af Amer 39 (*)    GFR calc Af Amer 46 (*)    All other components within normal limits  PRO B NATRIURETIC PEPTIDE - Abnormal; Notable for the following:    Pro B Natriuretic peptide (BNP) 2123.0 (*)    All other components within normal limits  GLUCOSE, CAPILLARY - Abnormal; Notable for the following:    Glucose-Capillary 287 (*)    All other components within normal limits  GLUCOSE, CAPILLARY - Abnormal; Notable for the following:    Glucose-Capillary 241 (*)    All other components within normal limits  CBC WITH DIFFERENTIAL  POTASSIUM  TROPONIN I  TROPONIN I  TSH  T4, FREE  MAGNESIUM  CBC  TROPONIN I  POCT I-STAT TROPONIN I   Dg Chest 2 View  07/10/2013   *RADIOLOGY REPORT*  Clinical Data: Shortness of breath, chest pain.  CHEST - 2 VIEW  Comparison: 04/11/2012  Findings: Left AICD is in place, unchanged.  Mild cardiomegaly. Mild peribronchial thickening.  No confluent airspace opacities or effusions.  No  acute bony abnormality.  IMPRESSION: Mild cardiomegaly.  Bronchitic changes.   Original Report Authenticated By: Charlett Nose, M.D.   1. Chest pain   2. Atrial fibrillation   3. SOB (shortness of breath)   4. Hyperkalemia   5. Renal insufficiency   6. Acute myocardial infarction, unspecified site, episode of care unspecified   7. CAD (coronary artery disease), native coronary artery   8. Cardiomyopathy, ischemic   9. Chest pain at rest   10. PAF (paroxysmal atrial fibrillation)   11. Unstable angina      Date: 07/10/2013  Rate: 106  Rhythm: atrial fibrillation w/ RVR  QRS Axis: left  Intervals: indeterminate  ST/T Wave abnormalities: nonspecific T wave changes  Conduction Disutrbances:right bundle branch block  Narrative Interpretation: No new ST or T wave changes cw ischemia  Old EKG Reviewed: unchanged   MDM  8:50 AM 57 y.o. male w CAD s/p MI, CHF, hx of rectal cancer pw sob/chest tightness for 2 days. Worse w/ ambulation. Pt mildly hypotensive here, HR 106 on arrival, O 2 sat 92% on RA. Pain not cw previous MI. Labs, small bolus for soft BP as pt has no evidence of fluid overload on exam.   Consulted [facility] for admission.    Junius Argyle, MD 07/11/13 [phone]

## 2013-07-10 NOTE — Progress Notes (Signed)
Please note on pt's home meds, he is now on Amiodarone 200 mg BID increased for his atrial fib and not Lopressor but Toprol XL 200 mg in AM and 100 mg in PM.  I have decreased his insulin here in the hospital.

## 2013-07-10 NOTE — Progress Notes (Addendum)
ANTICOAGULATION CONSULT NOTE - Initial Consult  Pharmacy Consult for heparin Indication: chest pain/ACS  No Known Allergies  Patient Measurements: Wt= 107kg Ht: 5' 10'' IBW= 73kg Heparin dosing weight: 96kg  Vital Signs: Temp: 97.5 F (36.4 C) (08/05 1043) Temp src: Oral (08/05 1043) BP: 94/70 mmHg (08/05 1630) Pulse Rate: 73 (08/05 1630)  Labs:  Recent Labs  07/10/13 1215  HGB 15.0  HCT 43.5  PLT 169  LABPROT 27.8*  INR 2.71*  CREATININE 1.78*    The CrCl is unknown because both a height and weight (above a minimum accepted value) are required for this calculation.   Medical History: Past Medical History  Diagnosis Date  . Unspecified essential hypertension   . Type II or unspecified type diabetes mellitus without mention of complication, not stated as uncontrolled   . 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 CAD (coronary artery disease)     hx Ant MI 2000 with stent to LAD,    . S/P colonoscopy 08/26/2008    Normal via ostomy  . CHF (congestive heart failure)   . Acute myocardial infarction, unspecified site, episode of care unspecified 2001  . 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 Prostate cancer   . GERD (gastroesophageal reflux disease)   . Cardiomyopathy, ischemic     with Hx of BIV ICD St. Jude      Assessment: 57 yo male here with CP (noted with history of MI with stent to LAD in 2000) to start on heparin when INR < 2.0. He is on coumadin PTA for afib and to hold therapy for cath. INR today = 2.71.  Goal of Therapy:  Heparin level 0.3-0.7 units/ml Monitor platelets by anticoagulation protocol: Yes   Plan:  -Hold heparin for now -Daily PT/INR -Begin heparin infusion when INR < 2.0  *No VTE prophylaxis needed as INR is 2.71.  Harland German, Pharm D 07/10/2013 5:36 PM

## 2013-07-10 NOTE — H&P (Signed)
[name] is an 57 y.o. male.    Primary Cardiologist:Dr. Alanda Amass PCP: Cassell Smiles., MD  Chief Complaint: chest tightness assoc. With SOB, nausea and vomiting.  HPI: 5 yoWM with hx of BiV-ICD with EF now at 23% - ICM, , and hx of stent to LAD.  Prior ant. Wall MI 11/1999 with PCI/Stent, EF at that time of 20%.  He has has last cath 2009 : FINAL IMPRESSIONS:  1. Dilated cardiomyopathy, ischemic type.  2. Left anterior descending stent, midportion of the vessel patent  with no more than 30-40% area of in-stent restenosis.  3. Left anterior descending lesions of about 40-50% mid and distal.  4. Patent circumflex.  5. Patent nondominant right coronary artery.  6. Patent renal arteries.  7. Patent infrarenal abdominal aorta.  8. Patent common iliac arteries Last Nuc. 3013 with extensive scarring from prior infarct LAD and inferior with EF 23%.  He has done well without Heart failure.  Recently with PAF, he is on coumadin and his Amiodarone has been increased to 400 mg daily and metoprolol to 300 mg daily.   He is followed by Dr. Alanda Amass and Dr. Pernell Dupre at [facility].  Possible ablation in the future.   Today he presents with chest tightness since Sunday.  Assoc. With SOB and Nausea and vomiting at times.  Here in ER on exam no tightness.  He also had some wheezes earlier in the week, possible from increase of BB.    EKG with RBBB, chronic and a fib with occ pacing.  No acute changes.   This is first episode of chest tightness since 2009.  Past Medical History  Diagnosis Date  . Unspecified essential hypertension   . Type II or unspecified type diabetes mellitus without mention of complication, not stated as uncontrolled   . 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 CAD (coronary artery disease)     hx Ant MI 2000 with stent to LAD,    . S/P colonoscopy 08/26/2008    Normal via ostomy  . CHF (congestive heart failure)   . Acute myocardial  infarction, unspecified site, episode of care unspecified 2001  . 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 Prostate cancer   . GERD (gastroesophageal reflux disease)   . Cardiomyopathy, ischemic     with Hx of BIV ICD St. Jude     Past Surgical History  Procedure Laterality Date  . Internal defibrillator and pacemaker  2010    x2  . Abdominal and perineal resection of rectum with total mesorectal excision      10/04/2007  . Colonoscopy      06 /10/2007. IMPRESSION: Semilunar, apple-core neoplasm low in the rectum (palpable on digital rectal exam) beginning at 5 cm and corkscrewing up 5 cm in length. This was a low rectal lesion consistent with colorectal carcinoma. It was biosied multiple times. The upstream colon all the way to the cecum appeared normal. Recommendations: Followup on path. Surgical Consultation   . Coronary angioplasty    . Colonoscopy  09/14/2011    Procedure: COLONOSCOPY;  Surgeon: Corbin Ade, MD;  Location: [facility];  Service: Endoscopy;  Laterality: N/A;  8:30- TCS via colostomy & pt has defibrillator    Family History  Problem Relation Age of Onset  . Colon cancer Mother 73  . Colon cancer Sister 14  . Coronary artery disease Father    Social History:  reports that he has never smoked. He does not have any smokeless tobacco history on file. He reports that he does not drink alcohol or use illicit drugs.  Allergies: No Known Allergies  Outpatient Medications:  Enalapril 10 mg BID Niaspan 1 gm daily aldactone 12.5 mg daily Lopressor 300 mg daily  (200 in AM and 100 pm) Protonix 40 mg daily Ditropan 20 mg daily Coumadin Amiodarone 400 mg daily Fish oil daily Ranexa 500 BID Lasix 20 mg BID Metformin 500 mg BID Isordil 15 mg    Results for orders placed during the hospital encounter of 07/10/13 (from the past 48 hour(s))  PRO B NATRIURETIC PEPTIDE     Status: Abnormal   Collection Time    07/10/13 12:15 PM       Result Value Range   Pro B Natriuretic peptide (BNP) 2585.0 (*) 0 - 125 pg/mL  CBC WITH DIFFERENTIAL     Status: None   Collection Time    07/10/13 12:15 PM      Result Value Range   WBC 9.5  4.0 - 10.5 K/uL   RBC 4.80  4.22 - 5.81 MIL/uL   Hemoglobin 15.0  13.0 - 17.0 g/dL   HCT 04.5  40.9 - 81.1 %   MCV 90.6  78.0 - 100.0 fL   MCH 31.3  26.0 - 34.0 pg   MCHC 34.5  30.0 - 36.0 g/dL   RDW 91.4  78.2 - 95.6 %   Platelets 169  150 - 400 K/uL   Neutrophils Relative % 77  43 - 77 %   Neutro Abs 7.3  1.7 - 7.7 K/uL   Lymphocytes Relative 14  12 - 46 %   Lymphs Abs 1.3  0.7 - 4.0 K/uL   Monocytes Relative 8  3 - 12 %   Monocytes Absolute 0.8  0.1 - 1.0 K/uL   Eosinophils Relative 1  0 - 5 %   Eosinophils Absolute 0.1  0.0 - 0.7 K/uL   Basophils Relative 0  0 - 1 %   Basophils Absolute 0.0  0.0 - 0.1 K/uL  COMPREHENSIVE METABOLIC PANEL     Status: Abnormal   Collection Time    07/10/13 12:15 PM      Result Value Range   Sodium 132 (*) 135 - 145 mEq/L   Potassium 5.2 (*) 3.5 - 5.1 mEq/L   Comment: HEMOLYSIS AT THIS LEVEL MAY AFFECT RESULT   Chloride 97  96 - 112 mEq/L   CO2 20  19 - 32 mEq/L   Glucose, Bld 378 (*) 70 - 99 mg/dL   BUN 29 (*) 6 - 23 mg/dL   Creatinine, Ser 2.13 (*) 0.50 - 1.35 mg/dL   Calcium 9.6  8.4 - 08.6 mg/dL   Total Protein 7.0  6.0 - 8.3 g/dL   Albumin 3.2 (*) 3.5 - 5.2 g/dL   AST 27  0 - 37 U/L   ALT 18  0 - 53 U/L   Alkaline Phosphatase 78  39 - 117 U/L   Total Bilirubin 0.9  0.3 - 1.2 mg/dL   GFR calc non Af Amer 40 (*) >90 mL/min   GFR calc Af Amer 47 (*) >90 mL/min   Comment:            The eGFR has been calculated     using the CKD EPI equation.     This calculation has not been     validated in all clinical  situations.     eGFR's persistently     <90 mL/min signify     possible Chronic Kidney Disease.  PROTIME-INR     Status: Abnormal   Collection Time    07/10/13 12:15 PM      Result Value Range   Prothrombin Time 27.8 (*) 11.6 -  15.2 seconds   INR 2.71 (*) 0.00 - 1.49  POCT I-STAT TROPONIN I     Status: None   Collection Time    07/10/13 12:54 PM      Result Value Range   Troponin i, poc 0.00  0.00 - 0.08 ng/mL   Comment 3            Comment: Due to the release kinetics of cTnI,     a negative result within the first hours     of the onset of symptoms does not rule out     myocardial infarction with certainty.     If myocardial infarction is still suspected,     repeat the test at appropriate intervals.  POTASSIUM     Status: Abnormal   Collection Time    07/10/13  1:50 PM      Result Value Range   Potassium 6.0 (*) 3.5 - 5.1 mEq/L   Comment: HEMOLYSIS AT THIS LEVEL MAY AFFECT RESULT  POTASSIUM     Status: None   Collection Time    07/10/13  3:40 PM      Result Value Range   Potassium 4.6  3.5 - 5.1 mEq/L   Comment: SLIGHT HEMOLYSIS   Dg Chest 2 View  07/10/2013   *RADIOLOGY REPORT*  Clinical Data: Shortness of breath, chest pain.  CHEST - 2 VIEW  Comparison: 04/11/2012  Findings: Left AICD is in place, unchanged.  Mild cardiomegaly. Mild peribronchial thickening.  No confluent airspace opacities or effusions.  No acute bony abnormality.  IMPRESSION: Mild cardiomegaly.  Bronchitic changes.   Original Report Authenticated By: Charlett Nose, M.D.    ROS: General:no colds or fevers, no weight changes, ? wheezes Skin:no rashes or ulcers HEENT:no blurred vision, no congestion CV:see HPI PUL:see HPI GI:no diarrhea, + constipation no melena, no indigestion GU:no hematuria, no dysuria MS:no joint pain, no claudication Neuro:no syncope, no lightheadedness Endo:+ diabetes, no thyroid disease   Blood pressure 94/70, pulse 73, temperature 97.5 F (36.4 C), temperature source Oral, resp. rate 14, SpO2 96.00%. PE: General:Pleasant affect, NAD Skin:Warm and dry, brisk capillary refill HEENT:normocephalic, sclera clear, mucus membranes moist Neck:supple, no JVD, no bruits  Heart:irreg irreg without murmur,  gallup, rub or click Lungs:clear without rales, rhonchi, or wheezes ZOX:WRUE, non tender, + BS, do not palpate liver spleen or masses Ext:no lower ext edema, 2+ pedal pulses, 2+ radial pulses Neuro:alert and oriented, MAE, follows commands, + facial symmetry    Assessment/Plan Principal Problem:   Unstable angina Active Problems:   DIABETES MELLITUS   Chest pain at rest   SOB (shortness of breath)   Chest pain   Hyperkalemia   Renal insufficiency   CAD (coronary artery disease), native coronary artery, with LCX disease and stents to LAD   Cardiomyopathy, ischemic  PLAN:  Admit to tele, hold coumadin plan cath in next several days once INR is down, IV heparin when INR less than 2.  Cardiac enzymes.   [facility] R Nurse Practitioner Certified [facility] Pager [phone] 07/10/2013, 4:48 PM   Agree with note written by Nada Boozer RNP  Pt of RAW's with ISCM, BiV ICD ,  EF 20%, Afib recently put on Amio and coumadin A/C. Also followed by Dr. Pernell Dupre at [facility]. Over past few days he has developed cough, wheezing SOB and chest pressure. Pt admits to recent dietary indiscretion. Currently pain free. Exam benign. Labs remarkable for increased BNP 2500.  No edema on CXR. INR 2.7. Plan admit, diurese, let INR drift down and cath when < 1.7.  [name] 07/10/2013 5:17 PM

## 2013-07-10 NOTE — ED Notes (Signed)
Pt is here with shortness of breath and chest tightness that started on Sunday.  Pt reports some type of wheezing.  Pt has history of chf, MI

## 2013-07-11 DIAGNOSIS — E785 Hyperlipidemia, unspecified: Secondary | ICD-10-CM

## 2013-07-11 DIAGNOSIS — N289 Disorder of kidney and ureter, unspecified: Secondary | ICD-10-CM

## 2013-07-11 DIAGNOSIS — E875 Hyperkalemia: Secondary | ICD-10-CM

## 2013-07-11 DIAGNOSIS — I5023 Acute on chronic systolic (congestive) heart failure: Secondary | ICD-10-CM | POA: Diagnosis present

## 2013-07-11 LAB — CBC
MCV: 90.4 fL (ref 78.0–100.0)
Platelets: 152 10*3/uL (ref 150–400)
RDW: 14.2 % (ref 11.5–15.5)
WBC: 7.4 10*3/uL (ref 4.0–10.5)

## 2013-07-11 LAB — BASIC METABOLIC PANEL
Chloride: 98 mEq/L (ref 96–112)
Creatinine, Ser: 1.82 mg/dL — ABNORMAL HIGH (ref 0.50–1.35)
GFR calc Af Amer: 46 mL/min — ABNORMAL LOW (ref >90)

## 2013-07-11 LAB — T4, FREE: Free T4: 1.65 ng/dL (ref 0.80–1.80)

## 2013-07-11 LAB — LIPID PANEL
HDL: 27 mg/dL — ABNORMAL LOW (ref >39)
LDL Cholesterol: 161 mg/dL — ABNORMAL HIGH (ref 0–99)
Total CHOL/HDL Ratio: 8.8 RATIO
VLDL: 50 mg/dL — ABNORMAL HIGH (ref 0–40)

## 2013-07-11 LAB — TROPONIN I
Troponin I: <0.3 ng/mL (ref <0.30)
Troponin I: <0.3 ng/mL (ref <0.30)

## 2013-07-11 LAB — GLUCOSE, CAPILLARY
Glucose-Capillary: 279 mg/dL — ABNORMAL HIGH (ref 70–99)
Glucose-Capillary: 182 mg/dL — ABNORMAL HIGH (ref 70–99)

## 2013-07-11 LAB — TSH: TSH: 2.109 u[IU]/mL (ref 0.350–4.500)

## 2013-07-11 LAB — PRO B NATRIURETIC PEPTIDE: Pro B Natriuretic peptide (BNP): 2123 pg/mL — ABNORMAL HIGH (ref 0–125)

## 2013-07-11 LAB — HEMOGLOBIN A1C: Mean Plasma Glucose: 272 mg/dL — ABNORMAL HIGH (ref <117)

## 2013-07-11 MED ORDER — ASPIRIN EC 81 MG PO TBEC
81.0000 mg | DELAYED_RELEASE_TABLET | Freq: Every day | ORAL | Status: DC
  Administered 2013-07-11 – 2013-07-12 (×2): 81 mg via ORAL
  Filled 2013-07-11 (×3): qty 1

## 2013-07-11 NOTE — Clinical Documentation Improvement (Signed)
THIS DOCUMENT IS NOT A PERMANENT PART OF THE MEDICAL RECORD  Please update your documentation with the medical record to reflect your response to this query. If you need help knowing how to do this please call [phone].  07/11/13   Dear Dr. Allyson Sabal,  Per chart patient with HTN, CAD s/p stents, ICM now admitted with [place]. Would Hypertensive Heart Disease be an appropriate secondary diagnosis based on patient's documented conditions? Thank you.   You may use possible, probable, or suspect with inpatient documentation. possible, probable, suspected diagnoses MUST be documented at the time of discharge  Reviewed:  no additional documentation provided  Thank You,  Beverley Fiedler RN BSN Clinical Documentation Specialist: [phone] Health Information Management [place]

## 2013-07-11 NOTE — Progress Notes (Signed)
Subjective:  SOB and chest tightness improved.  Objective:  Vital Signs in the last 24 hours: Temp:  [97.5 F (36.4 C)-98.2 F (36.8 C)] 98.1 F (36.7 C) (08/06 0410) Pulse Rate:  [73-106] 98 (08/06 0410) Resp:  [12-22] 18 (08/06 0410) BP: (80-109)/(54-73) 80/59 mmHg (08/06 0410) SpO2:  [92 %-99 %] 96 % (08/06 0410) Weight:  [233 lb (105.688 kg)-233 lb 12.8 oz (106.051 kg)] 233 lb 12.8 oz (106.051 kg) (08/06 0410)  Intake/Output from previous day: No intake or output data in the 24 hours ending 07/11/13 0942  Physical Exam: General appearance: alert, cooperative and no distress Lungs: clear to auscultation bilaterally Heart: regular rate and rhythm   Rate: 96  Rhythm: AF  Lab Results:  Recent Labs  07/10/13 1215 07/11/13 0500  WBC 9.5 7.4  HGB 15.0 13.9  PLT 169 152    Recent Labs  07/10/13 1215  07/10/13 1540 07/11/13 0500  NA 132*  --   --  135  K 5.2*  < > 4.6 4.5  CL 97  --   --  98  CO2 20  --   --  24  GLUCOSE 378*  --   --  260*  BUN 29*  --   --  29*  CREATININE 1.78*  --   --  1.82*  < > = values in this interval not displayed.  Recent Labs  07/10/13 1957 07/11/13 0247  TROPONINI <0.30 <0.30   Hepatic Function Panel  Recent Labs  07/10/13 1215  PROT 7.0  ALBUMIN 3.2*  AST 27  ALT 18  ALKPHOS 78  BILITOT 0.9    Recent Labs  07/11/13 0500  CHOL 238*    Recent Labs  07/11/13 0500  INR 2.94*    Imaging: Dg Chest 2 View  07/10/2013   *RADIOLOGY REPORT*  Clinical Data: Shortness of breath, chest pain.  CHEST - 2 VIEW  Comparison: 04/11/2012  Findings: Left AICD is in place, unchanged.  Mild cardiomegaly. Mild peribronchial thickening.  No confluent airspace opacities or effusions.  No acute bony abnormality.  IMPRESSION: Mild cardiomegaly.  Bronchitic changes.   Original Report Authenticated By: Charlett Nose, M.D.    Cardiac Studies:  Assessment/Plan:   Principal Problem:   Unstable angina- Troponin negative  Active  Problems:   Acute on chronic clinical systolic heart failure- BNP 21 23 this am, no I/O   Hyperkalemia- on Vasotec and Aldactone with new renal insufficiency on admission.   Renal insufficiency- appears to be new   CAD with LCX disease and stents to LAD in '01, '09 (Dr Elayne Guerin follows)   ICM- EF 23% by Myoview 4/13   ICD '06, upgrade to [facility] Jude BiV ICD '09    DIABETES MELLITUS   HYPERTENSION   PAF (paroxysmal atrial fibrillation)- Amiodarone recently increased as an OP   Long term (current) use of anticoagulants- Coumadin on hold, INR  2.94    PLAN: Vasotec. Watch SCr and K+. I believe Dr Alanda Amass was concerned his recent deterioration in LVF was from AF. Coumadin on hold for cath as long as his renal function doesn't worsen. His  LDL is now 160, previously in the 50s, I think there may have been some medicine compliance issues prior to this admission.  Corine Shelter PA-C Beeper [phone] 07/11/2013, 9:42 AM   Agree with note written by Corine Shelter PAC  No further CP/SOB. BNP increased. Being diuresed. Scr increased as well. INR increased to 2.9. Coumadin on hold. Exam benign.  Plans to do cardiac cathh to define anatomy once INR falls to below 1.7.   [name] 07/11/2013 10:01 AM

## 2013-07-11 NOTE — Progress Notes (Addendum)
Inpatient Diabetes Program Recommendations  AACE/ADA: New Consensus Statement on Inpatient Glycemic Control (2013)  Target Ranges:  Prepandial:   less than 140 mg/dL      Peak postprandial:   less than 180 mg/dL (1-2 hours)      Critically ill patients:  140 - 180 mg/dL     Results for QUANTARIUS, GENRICH (MRN [id number]) as of 07/11/2013 12:12  Ref. Range 07/11/2013 07:31 07/11/2013 11:37  Glucose-Capillary Latest Range: 70-99 mg/dL 409 (H) 811 (H)    **Noted Lantus 15 units QHS started last pm- Fasting glucose elevated this morning  **MD- Please consider the following in-hospital insulin adjustments:  1. Increase Lantus to 20 units QHS 2. Increase Novolog meal coverage to Novolog 10 units tid with meals  1534 Addendum: Spoke with patient about his A1c of 11.1% (07/10/13).  Patient told me he has not been taking his insulin for several months now b/c of the cost.  He has a $40 co-pay with the Lantus insulin and told me he didn't feel like renewing the Rx b/c of the cost.  When he decided he probably should take his insulin, he could not get a refill from his PCP b/c (per pt) his PCP wants him to see an endocrinologist.  Patient stated he has been taking the Metformin regularly. Explained to patient how chronic hyperglycemia can have devastating effects on the body and that it can also put him at higher risk for MI and CVA.  Explained what an A1c is and what it measures.  Reminded patient that his goal A1c as a person living with DM should be 7% or less per ADA standards.  Gave patient a copy of his A1c results and encouraged him to see an endocrinologist.  Gave patient Dr. Isidoro Donning name and telephone number in [facility]) and encouraged him to make an appointment.  Patient stated he would do this after d/c.  I asked patient if he had any questions about his DM.  Patient stated he did not and thanked me for the information.   Will follow. Robert Finland  RN, MSN, CDE Diabetes Coordinator Inpatient Diabetes Program [phone]

## 2013-07-11 NOTE — Care Management (Signed)
Care Manager did speak to pt and he has concerns with affording his insulins. CM did discuss that we are not able to assist pt with any medication assistance at this time due to pt having insurance. CM did discuss that maybe pt's PCP can switch insulins to a cheaper one. No further needs from CM a this time. Gala Lewandowsky, RN,BSN [phone]

## 2013-07-11 NOTE — Progress Notes (Signed)
UR Completed Lakshya Mcgillicuddy Graves-Bigelow, RN,BSN [phone]  

## 2013-07-12 ENCOUNTER — Inpatient Hospital Stay (HOSPITAL_COMMUNITY): Admission: RE | Admit: 2013-07-12 | Payer: Medicare Other | Source: Ambulatory Visit

## 2013-07-12 DIAGNOSIS — E119 Type 2 diabetes mellitus without complications: Secondary | ICD-10-CM

## 2013-07-12 DIAGNOSIS — Z7901 Long term (current) use of anticoagulants: Secondary | ICD-10-CM

## 2013-07-12 LAB — BASIC METABOLIC PANEL
BUN: 27 mg/dL — ABNORMAL HIGH (ref 6–23)
Chloride: 97 mEq/L (ref 96–112)
GFR calc non Af Amer: 51 mL/min — ABNORMAL LOW (ref >90)
Glucose, Bld: 208 mg/dL — ABNORMAL HIGH (ref 70–99)
Potassium: 4.7 mEq/L (ref 3.5–5.1)

## 2013-07-12 LAB — GLUCOSE, CAPILLARY
Glucose-Capillary: 198 mg/dL — ABNORMAL HIGH (ref 70–99)
Glucose-Capillary: 260 mg/dL — ABNORMAL HIGH (ref 70–99)
Glucose-Capillary: 197 mg/dL — ABNORMAL HIGH (ref 70–99)
Glucose-Capillary: 278 mg/dL — ABNORMAL HIGH (ref 70–99)

## 2013-07-12 MED ORDER — INSULIN ASPART 100 UNIT/ML ~~LOC~~ SOLN
0.0000 [IU] | Freq: Three times a day (TID) | SUBCUTANEOUS | Status: DC
  Administered 2013-07-12: 3 [IU] via SUBCUTANEOUS
  Administered 2013-07-12 – 2013-07-13 (×2): 8 [IU] via SUBCUTANEOUS
  Administered 2013-07-14: 10:00:00 5 [IU] via SUBCUTANEOUS

## 2013-07-12 MED ORDER — INSULIN ASPART 100 UNIT/ML ~~LOC~~ SOLN: 0.0000 [IU] | SUBCUTANEOUS | Status: DC

## 2013-07-12 MED ORDER — ISOSORBIDE MONONITRATE ER 30 MG PO TB24
30.0000 mg | ORAL_TABLET | Freq: Two times a day (BID) | ORAL | Status: DC
  Administered 2013-07-12 – 2013-07-14 (×3): 30 mg via ORAL
  Filled 2013-07-12 (×5): qty 1

## 2013-07-12 MED ORDER — INSULIN ASPART 100 UNIT/ML ~~LOC~~ SOLN
0.0000 [IU] | Freq: Every day | SUBCUTANEOUS | Status: DC
  Administered 2013-07-12 – 2013-07-13 (×2): 3 [IU] via SUBCUTANEOUS

## 2013-07-12 MED ORDER — FUROSEMIDE 40 MG PO TABS
40.0000 mg | ORAL_TABLET | Freq: Every day | ORAL | Status: DC
  Administered 2013-07-12 – 2013-07-14 (×2): 40 mg via ORAL
  Filled 2013-07-12 (×3): qty 1

## 2013-07-12 NOTE — Progress Notes (Signed)
See signed co-signed note by Mr. Loleta Books.  Marykay Lex, MD

## 2013-07-12 NOTE — Progress Notes (Signed)
ANTICOAGULATION CONSULT NOTE - Follow-up Consult  Pharmacy Consult for heparin Indication: chest pain/ACS  No Known Allergies  Patient Measurements: Wt= 107kg Ht: 5' 10'' IBW= 73kg Heparin dosing weight: 96kg  Vital Signs: Temp: 98.5 F (36.9 C) (08/07 0534) Temp src: Oral (08/07 0534) BP: 100/73 mmHg (08/07 0534) Pulse Rate: 105 (08/07 0534)  Labs:  Recent Labs  07/10/13 1215 07/10/13 1957 07/11/13 0247 07/11/13 0500 07/11/13 0855 07/12/13 0505  HGB 15.0  --   --  13.9  --   --   HCT 43.5  --   --  40.7  --   --   PLT 169  --   --  152  --   --   LABPROT 27.8*  --   --  29.6*  --  26.8*  INR 2.71*  --   --  2.94*  --  2.58*  CREATININE 1.78*  --   --  1.82*  --  1.47*  TROPONINI  --  <0.30 <0.30  --  <0.30  --     Estimated Creatinine Clearance: 66.4 ml/min (by C-G formula based on Cr of 1.47).   Medical History: Past Medical History  Diagnosis Date  . Unspecified essential hypertension   . Type II or unspecified type diabetes mellitus without mention of complication, not stated as uncontrolled   . 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 CAD (coronary artery disease)     hx Ant MI 2000 with stent to LAD,  stent to LAD again in 2009. last cath 2009  . S/P colonoscopy 08/26/2008    Normal via ostomy  . CHF (congestive heart failure)   . Acute myocardial infarction, unspecified site, episode of care unspecified 2000  . 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 Prostate cancer   . GERD (gastroesophageal reflux disease)   . Cardiomyopathy, ischemic     with Hx of BIV ICD St. Jude EF 23%   Assessment: 57 yo male here with CP (noted with history of MI with stent to LAD in 2000) to start on heparin when INR < 2.0. He is on coumadin PTA for afib and to hold therapy for cath. INR today = 2.58.  Plan for cath when INR ~1.5,  Goal of Therapy:  Heparin level 0.3-0.7 units/ml Monitor platelets by anticoagulation  protocol: Yes   Plan:  - Continue to hold heparin -- and coumadin - Daily PT/INR - Begin heparin infusion when INR < 2.0  Dahna Hattabaugh L. Illene Bolus, PharmD, BCPS Clinical Pharmacist Pager: [phone] Pharmacy: [phone] 07/12/2013 11:14 AM

## 2013-07-12 NOTE — Progress Notes (Addendum)
Subjective:  He had some chest tightness last night relieved with NTG.  Objective:  Vital Signs in the last 24 hours: Temp:  [98.1 F (36.7 C)-99 F (37.2 C)] 98.5 F (36.9 C) (08/07 0534) Pulse Rate:  [90-108] 105 (08/07 0534) Resp:  [18] 18 (08/07 0534) BP: (92-100)/(60-73) 100/73 mmHg (08/07 0534) SpO2:  [95 %-97 %] 97 % (08/07 0534) Weight:  [230 lb 14.4 oz (104.736 kg)] 230 lb 14.4 oz (104.736 kg) (08/07 0534)  Intake/Output from previous day:  Intake/Output Summary (Last 24 hours) at 07/12/13 1059 Last data filed at 07/12/13 0900  Gross per 24 hour  Intake    480 ml  Output    400 ml  Net     80 ml    Physical Exam: General appearance: alert, cooperative and no distress Lungs: clear to auscultation bilaterally Heart: irreg irreg Extremities: no edema   Rate: 98  Rhythm: atrial fibrillation  Lab Results:  Recent Labs  07/10/13 1215 07/11/13 0500  WBC 9.5 7.4  HGB 15.0 13.9  PLT 169 152    Recent Labs  07/11/13 0500 07/12/13 0505  NA 135 131*  K 4.5 4.7  CL 98 97  CO2 24 22  GLUCOSE 260* 208*  BUN 29* 27*  CREATININE 1.82* 1.47*    Recent Labs  07/11/13 0247 07/11/13 0855  TROPONINI <0.30 <0.30   Hepatic Function Panel  Recent Labs  07/10/13 1215  PROT 7.0  ALBUMIN 3.2*  AST 27  ALT 18  ALKPHOS 78  BILITOT 0.9    Recent Labs  07/11/13 0500  CHOL 238*    Recent Labs  07/12/13 0505  INR 2.58*    Imaging: Imaging results have been reviewed  Cardiac Studies:  Assessment/Plan:   Principal Problem:   Unstable angina Active Problems:   SOB (shortness of breath)   Acute on chronic clinical systolic heart failure   CARDIOMYOPATHY, ISCHEMIC, with BiV ICD, st Jude EF 23%   Hyperkalemia   Renal insufficiency- appears to be new   CAD with LCX disease and stents to LAD   ADENOCARCINOMA, RECTUM radiation/ chemo/ surg 2008   DIABETES MELLITUS   HYPERTENSION   PAF (paroxysmal atrial fibrillation)   Long term (current) use  of anticoagulants   Dyslipidemia- (LDL 160, previously in the 50s)    PLAN: He is on Amiodarone 200 mg BID, Toprol 200 mg daily, and Lanoxin for AF. His INR is still elevated. His symptoms last night sound like angina, he has no signs of CHF on exam. Increase Imdur to 30mg  BID (currently on 15 mg BID). Cath when INR closer to 1.5. SCr improving and K+ stable off Vasotec. He is on Aldactone 12.5 mg but not Lasix. Consider adding Lasix 40 mg daily. Sliding scale adjusted.  Corine Shelter PA-C Beeper [phone] 07/12/2013, 10:59 AM  I have seen and evaluated the patient this PM along with Corine Shelter, PA. I agree with his findings, examination as well as impression recommendations.  57 y/o man with what amounts to be severe ICM, Afib (with very difficult to control rate on multiple meds -- with AICD bradycardia is not  A major concern) admitted for SSx of Angina.  Cath on hold while awainting INR drifting down -- ? If < 2.0, could consider Radial Cath tomorrow.  Agree with increasing Nitrate, but with borderline pressures, no further room to titrate up other meds.  Agree that high LAD/LVEDP could potentiate angina -- will dose PO Lasix (hopefully LVEDP on Cath with  help guide Rx further).  Next step -- increase Ranexa.  For now, best chance is INR ~2 tomorrow & can do R Radial access cath. Renal function improved. On statin.  MD Time with pt: 15 min  Yvonnie Schinke W, M.D., M.S. [facility] [address] [place], [place]  [address]  [phone] Pager # [phone] 07/12/2013 2:52 PM

## 2013-07-12 NOTE — Progress Notes (Signed)
  See Franky Macho Kilroy's note.

## 2013-07-13 ENCOUNTER — Encounter (HOSPITAL_COMMUNITY): Payer: Self-pay | Admitting: General Practice

## 2013-07-13 ENCOUNTER — Encounter (HOSPITAL_COMMUNITY)
Admission: EM | Disposition: A | Discharge status: Home / Self Care | Payer: Self-pay | Source: Home / Self Care | Attending: Cardiovascular Disease

## 2013-07-13 DIAGNOSIS — I2510 Atherosclerotic heart disease of native coronary artery without angina pectoris: Secondary | ICD-10-CM

## 2013-07-13 HISTORY — PX: LEFT HEART CATHETERIZATION WITH CORONARY ANGIOGRAM: SHX5451

## 2013-07-13 LAB — BASIC METABOLIC PANEL
Calcium: 9.8 mg/dL (ref 8.4–10.5)
GFR calc non Af Amer: 48 mL/min — ABNORMAL LOW (ref >90)
Potassium: 4.9 mEq/L (ref 3.5–5.1)
Sodium: 134 mEq/L — ABNORMAL LOW (ref 135–145)

## 2013-07-13 LAB — GLUCOSE, CAPILLARY
Glucose-Capillary: 232 mg/dL — ABNORMAL HIGH (ref 70–99)
Glucose-Capillary: 288 mg/dL — ABNORMAL HIGH (ref 70–99)
Glucose-Capillary: 326 mg/dL — ABNORMAL HIGH (ref 70–99)

## 2013-07-13 LAB — PROTIME-INR: INR: 2.05 — ABNORMAL HIGH (ref 0.00–1.49)

## 2013-07-13 LAB — PRO B NATRIURETIC PEPTIDE: Pro B Natriuretic peptide (BNP): 3829 pg/mL — ABNORMAL HIGH (ref 0–125)

## 2013-07-13 SURGERY — LEFT HEART CATHETERIZATION WITH CORONARY ANGIOGRAM
Anesthesia: LOCAL

## 2013-07-13 MED ORDER — ASPIRIN 81 MG PO CHEW
81.0000 mg | CHEWABLE_TABLET | Freq: Every day | ORAL | Status: DC
  Administered 2013-07-13: 81 mg via ORAL
  Filled 2013-07-13 (×2): qty 1

## 2013-07-13 MED ORDER — HEPARIN (PORCINE) IN NACL 2-0.9 UNIT/ML-% IJ SOLN
INTRAMUSCULAR | Status: AC
  Filled 2013-07-13: qty 500

## 2013-07-13 MED ORDER — HEPARIN (PORCINE) IN NACL 2-0.9 UNIT/ML-% IJ SOLN
INTRAMUSCULAR | Status: AC
  Filled 2013-07-13: qty 1000

## 2013-07-13 MED ORDER — SODIUM CHLORIDE 0.9 % IV SOLN: INTRAVENOUS | Status: AC

## 2013-07-13 MED ORDER — DIAZEPAM 5 MG PO TABS: 5.0000 mg | ORAL_TABLET | ORAL | Status: DC

## 2013-07-13 MED ORDER — SODIUM CHLORIDE 0.9 % IV SOLN: 250.0000 mL | INTRAVENOUS | Status: DC | PRN

## 2013-07-13 MED ORDER — ASPIRIN 81 MG PO CHEW: 324.0000 mg | CHEWABLE_TABLET | ORAL | Status: DC

## 2013-07-13 MED ORDER — SODIUM CHLORIDE 0.45 % IV SOLN: INTRAVENOUS | Status: DC

## 2013-07-13 MED ORDER — FENTANYL CITRATE 0.05 MG/ML IJ SOLN
INTRAMUSCULAR | Status: AC
  Filled 2013-07-13: qty 2

## 2013-07-13 MED ORDER — ACETAMINOPHEN 325 MG PO TABS: 650.0000 mg | ORAL_TABLET | ORAL | Status: DC | PRN

## 2013-07-13 MED ORDER — NITROGLYCERIN 0.2 MG/ML ON CALL CATH LAB
INTRAVENOUS | Status: AC
  Filled 2013-07-13: qty 1

## 2013-07-13 MED ORDER — ASPIRIN 81 MG PO CHEW
CHEWABLE_TABLET | ORAL | Status: AC
  Filled 2013-07-13: qty 4

## 2013-07-13 MED ORDER — INSULIN GLARGINE 100 UNIT/ML ~~LOC~~ SOLN
20.0000 [IU] | Freq: Every day | SUBCUTANEOUS | Status: DC
  Administered 2013-07-13: 20 [IU] via SUBCUTANEOUS
  Filled 2013-07-13 (×2): qty 0.2

## 2013-07-13 MED ORDER — WARFARIN SODIUM 2.5 MG PO TABS
2.5000 mg | ORAL_TABLET | Freq: Once | ORAL | Status: AC
  Administered 2013-07-13: 2.5 mg via ORAL
  Filled 2013-07-13: qty 1

## 2013-07-13 MED ORDER — VERAPAMIL HCL 2.5 MG/ML IV SOLN
INTRAVENOUS | Status: AC
  Filled 2013-07-13: qty 2

## 2013-07-13 MED ORDER — LIDOCAINE HCL (PF) 1 % IJ SOLN
INTRAMUSCULAR | Status: AC
  Filled 2013-07-13: qty 30

## 2013-07-13 MED ORDER — SODIUM CHLORIDE 0.9 % IJ SOLN: 3.0000 mL | INTRAMUSCULAR | Status: DC | PRN

## 2013-07-13 MED ORDER — ASPIRIN 81 MG PO CHEW: 81.0000 mg | CHEWABLE_TABLET | Freq: Every day | ORAL | Status: DC

## 2013-07-13 MED ORDER — HEPARIN SODIUM (PORCINE) 1000 UNIT/ML IJ SOLN
INTRAMUSCULAR | Status: AC
  Filled 2013-07-13: qty 1

## 2013-07-13 MED ORDER — WARFARIN - PHARMACIST DOSING INPATIENT
Freq: Every day | Status: DC
  Administered 2013-07-13: 18:00:00

## 2013-07-13 MED ORDER — SODIUM CHLORIDE 0.9 % IJ SOLN: 3.0000 mL | Freq: Two times a day (BID) | INTRAMUSCULAR | Status: DC

## 2013-07-13 MED ORDER — MIDAZOLAM HCL 2 MG/2ML IJ SOLN
INTRAMUSCULAR | Status: AC
  Filled 2013-07-13: qty 2

## 2013-07-13 MED ORDER — ONDANSETRON HCL 4 MG/2ML IJ SOLN: 4.0000 mg | Freq: Four times a day (QID) | INTRAMUSCULAR | Status: DC | PRN

## 2013-07-13 MED ORDER — MORPHINE SULFATE 2 MG/ML IJ SOLN: 1.0000 mg | INTRAMUSCULAR | Status: DC | PRN

## 2013-07-13 MED ORDER — SODIUM CHLORIDE 0.9 % IV SOLN: INTRAVENOUS | Status: DC

## 2013-07-13 NOTE — Progress Notes (Signed)
Subjective:  Still some chest pain yesterday.  Objective:  Vital Signs in the last 24 hours: Temp:  [98 F (36.7 C)-98.5 F (36.9 C)] 98.5 F (36.9 C) (08/08 0441) Pulse Rate:  [105-120] 120 (08/08 0441) Resp:  [18] 18 (08/08 0441) BP: (87-102)/(68-73) 97/71 mmHg (08/08 0441) SpO2:  [95 %-98 %] 95 % (08/08 0441) Weight:  [226 lb 8 oz (102.74 kg)] 226 lb 8 oz (102.74 kg) (08/08 0441)  Intake/Output from previous day: No intake or output data in the 24 hours ending 07/13/13 1914  Physical Exam: General appearance: alert, cooperative, no distress and moderately obese Lungs: clear to auscultation bilaterally Heart: regular rate and rhythm   Rate: 100  Rhythm: atrial fibrillation  Lab Results:  Recent Labs  07/10/13 1215 07/11/13 0500  WBC 9.5 7.4  HGB 15.0 13.9  PLT 169 152    Recent Labs  07/12/13 0505 07/13/13 0545  NA 131* 134*  K 4.7 4.9  CL 97 95*  CO2 22 24  GLUCOSE 208* 268*  BUN 27* 23  CREATININE 1.47* 1.55*    Recent Labs  07/11/13 0247 07/11/13 0855  TROPONINI <0.30 <0.30   Hepatic Function Panel  Recent Labs  07/10/13 1215  PROT 7.0  ALBUMIN 3.2*  AST 27  ALT 18  ALKPHOS 78  BILITOT 0.9    Recent Labs  07/11/13 0500  CHOL 238*    Recent Labs  07/13/13 0545  INR 2.05*    Imaging: Imaging results have been reviewed  Cardiac Studies:  Assessment/Plan:   Principal Problem:   Unstable angina Active Problems:   SOB (shortness of breath)   Acute on chronic clinical systolic heart failure   CARDIOMYOPATHY, ISCHEMIC, with BiV ICD, st Jude EF 23%   Hyperkalemia   Renal insufficiency- appears to be new   CAD with LCX disease and stents to LAD   ADENOCARCINOMA, RECTUM radiation/ chemo/ surg 2008   DIABETES MELLITUS   HYPERTENSION   PAF (paroxysmal atrial fibrillation)   Long term (current) use of anticoagulants   Dyslipidemia- (LDL 160, previously in the 50s)    PLAN: Discussed with Dr Allyson Sabal, will proceed with cath  possible PCI radially. He will need Heparin to Coumadin post PCI  [facility] PA-C Beeper [phone] 07/13/2013, 9:28 AM   Agree with note written by Corine Shelter [facility]  Admitted with CHF and [place]. ISCM/PAF/ICD. INR has drifted down to 2. Had CP yesterday. Plan cor angio today via RRA. No LV gram. SCr 1.5.  [name] 07/13/2013 10:28 AM

## 2013-07-13 NOTE — CV Procedure (Signed)
AISON [name] is a 57 y.o. male    [id number] LOCATION:  FACILITY: [facility]  PHYSICIAN: Nanetta Batty, M.D. 12/30/1955   DATE OF PROCEDURE:  07/13/2013  DATE OF DISCHARGE:   CARDIAC CATHETERIZATION     History obtained from chart review. 57 year old Caucasian male patient of Dr. Alanda Amass with a history of remote anterior myocardial infarction in 2002 with stenting of his LAD. He has severe ischemic cardiomyopathy.he's had a [place] ICD placed. He underwent cardiac catheterization 2009 revealing a patent stent to his LAD and severely diseased diagonal and obtuse marginal branches. He has paroxysmal [place] fibrillation on Coumadin anticoagulation. He was admitted several days ago a chest pain and shortness of breath. He ruled out for myocardial infarction. It was decided to proceed to cardiac catheterization to define his anatomy. His Coumadin was held and his INR drifted down to 2. He presents now for cardiac catheterization via the right radial approach.   PROCEDURE DESCRIPTION:    The patient was brought to the second floor [facility] cath lab in the postabsorptive state. He was premedicated with Valium 5 mg by mouth, IV Versed and fentanyl. His right wrist was prepped and shaved in usual sterile fashion. Xylocaine 1% was used for local anesthesia. A 6 French sheath was inserted into the right radial artery using standard Seldinger technique. The patient received 5000 units  of heparin  intravenously.  A 5 [place] TIG catheter was used for selective coronary angiography. 80 cc of contrast was administered to the patient. Left ventriculography was not performed to conserve contrast given his serum creatinine of 1.5. Retrograde aortic pressure was monitored.    HEMODYNAMICS:    AO SYSTOLIC/AO DIASTOLIC: 102/75  ANGIOGRAPHIC RESULTS:   1. Left main; normal  2. LAD; the entire proximal third of the LAD was fluoroscopically calcified. The proximal third had approximately 50-60% segmental  stenosis. The stent was widely patent in the proximal third of the LAD with 30-40% in-stent restenosis". There was a moderate size first diagonal branch and the ramus distribution that was occluded in its proximal portion and filled by collaterals. This was noted to be highly diseased at his last cath in 2009. There was 50-60% segmental stenosis in the middle third and 70% in the distal/apical third. There were 2 small marginal branches arising from the middle third that had 90% ostial stenoses unchanged from prior cath 3. Left circumflex; dominant with 99% long segmental proximal OM1 stenosis in a small to medium-size vessel unchanged from prior cath 4. Right coronary artery; nondominant with 50% mid and 80% distal stenosis unchanged from prior cath  5. 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 The only change in his anatomy from 5 years ago was occlusion of the high first diagonal branch which was severely diffusely diseased previously. There are no "culprit vessels. The patient is already on maximal medical therapy. Plans will be continued medical therapy. The sheath was removed and a TR Band  was placed on the right wrist to achieve patent hemostasis. The patient left the Cath Lab in stable condition. He'll be gently hydrated and Coumadin will be restarted.  [name] MD, [facility] 07/13/2013 11:17 AM

## 2013-07-13 NOTE — Progress Notes (Signed)
Inpatient Diabetes Program Recommendations  AACE/ADA: New Consensus Statement on Inpatient Glycemic Control (2013)  Target Ranges:  Prepandial:   less than 140 mg/dL      Peak postprandial:   less than 180 mg/dL (1-2 hours)      Critically ill patients:  140 - 180 mg/dL    Results for CHANCE, MUNTER (MRN [id number]) as of 07/13/2013 11:50  Ref. Range 07/13/2013 00:27 07/13/2013 04:39 07/13/2013 07:45 07/13/2013 11:25  Glucose-Capillary Latest Range: 70-99 mg/dL 409 (H) 811 (H) 914 (H) 232 (H)    **MD- Please consider the following in-hospital insulin adjustments:  1. Increase Lantus to 20 units QHS 2. Increase Novolog meal coverage to Novolog 10 units tid with meals (currently ordered as 8 units tid with meals) 3. D/C Novolog Sensitive SSI Q4 hours (currently has two SSI regimens ordered) 4. Continue Novolog Moderate SSI tid ac + HS  **MD- Patient will need Rxs for Lantus and Novolog at d/c.  Patient states he ran out and has not gone to see his PCP for renewed RXs.  Patient states he plans to follow up with Dr. Fransico Him (endocrinologist) with [facility] after d/c for further DM management.    Will follow. Ambrose Finland RN, MSN, CDE Diabetes Coordinator Inpatient Diabetes Program [phone]

## 2013-07-13 NOTE — Progress Notes (Signed)
ANTICOAGULATION CONSULT NOTE - Follow-up Consult  Pharmacy Consult for coumadin Indication: afib  No Known Allergies  Patient Measurements: Wt= 107kg Ht: 5' 10'' IBW= 73kg Heparin dosing weight: 96kg  Vital Signs: Temp: 97.7 F (36.5 C) (08/08 1200) Temp src: Oral (08/08 1200) BP: 90/67 mmHg (08/08 1200) Pulse Rate: 105 (08/08 1200)  Labs:  Recent Labs  07/10/13 1957 07/11/13 0247 07/11/13 0500 07/11/13 0855 07/12/13 0505 07/13/13 0545  HGB  --   --  13.9  --   --   --   HCT  --   --  40.7  --   --   --   PLT  --   --  152  --   --   --   LABPROT  --   --  29.6*  --  26.8* 22.5*  INR  --   --  2.94*  --  2.58* 2.05*  CREATININE  --   --  1.82*  --  1.47* 1.55*  TROPONINI <0.30 <0.30  --  <0.30  --   --     Estimated Creatinine Clearance: 62.4 ml/min (by C-G formula based on Cr of 1.55).   Assessment: 57 yo male here with CP (noted with history of MI with stent to LAD in 2000) now s/p cath. He is on coumadin PTA for afib and to continue coumadin while inpatient. INR today= 2.05  Goal of Therapy:  INR= 2-3 Monitor platelets by anticoagulation protocol: Yes   Plan:  -Coumadin 2.5mg  today - Daily PT/INR  Harland German, Pharm D 07/13/2013 2:27 PM

## 2013-07-13 NOTE — Progress Notes (Signed)
TR BAND REMOVAL  LOCATION:    right radial  DEFLATED PER PROTOCOL:    yes  TIME BAND OFF / DRESSING APPLIED:    1345   SITE UPON ARRIVAL:    Level 0  SITE AFTER BAND REMOVAL:    Level 0  REVERSE ALLEN'S TEST:     positive  CIRCULATION SENSATION AND MOVEMENT:    Within Normal Limits   yes  COMMENTS:   Rechecked site at 1415 and no change noted CSM's wnls, radial and ulnar pulses +2's and positive reverse allens noted

## 2013-07-13 NOTE — H&P (Signed)
    Pt was reexamined and existing H & P reviewed. No changes found.  [name], MD [facility] 07/13/2013 10:32 AM

## 2013-07-14 LAB — BASIC METABOLIC PANEL
CO2: 21 mEq/L (ref 19–32)
GFR calc non Af Amer: 51 mL/min — ABNORMAL LOW (ref >90)
Glucose, Bld: 275 mg/dL — ABNORMAL HIGH (ref 70–99)
Potassium: 4.2 mEq/L (ref 3.5–5.1)
Sodium: 132 mEq/L — ABNORMAL LOW (ref 135–145)

## 2013-07-14 MED ORDER — RANOLAZINE ER 500 MG PO TB12
1000.0000 mg | ORAL_TABLET | Freq: Two times a day (BID) | ORAL | Status: DC
  Filled 2013-07-14: qty 2

## 2013-07-14 MED ORDER — MORPHINE SULFATE 2 MG/ML IJ SOLN: 1.0000 mg | INTRAMUSCULAR | Status: DC | PRN

## 2013-07-14 MED ORDER — DIGOXIN 125 MCG PO TABS: 187.5000 ug | ORAL_TABLET | ORAL | Status: DC

## 2013-07-14 MED ORDER — RANOLAZINE ER 1000 MG PO TB12: 1000.0000 mg | ORAL_TABLET | Freq: Two times a day (BID) | ORAL | Status: DC

## 2013-07-14 MED ORDER — METFORMIN HCL 500 MG PO TABS: 500.0000 mg | ORAL_TABLET | Freq: Two times a day (BID) | ORAL | Status: DC

## 2013-07-14 MED ORDER — DIGOXIN 187.5 MCG PO TABS: 187.5000 ug | ORAL_TABLET | ORAL | Status: DC

## 2013-07-14 NOTE — Progress Notes (Signed)
Subjective: No SOB or CP  Objective: Vital signs in last 24 hours: Temp:  [97.7 F (36.5 C)-99.4 F (37.4 C)] 97.8 F (36.6 C) (08/09 0854) Pulse Rate:  [59-127] 116 (08/09 0854) Resp:  [16-20] 18 (08/09 0332) BP: (85-118)/(57-84) 91/62 mmHg (08/09 0854) SpO2:  [90 %-98 %] 95 % (08/09 0854)    Intake/Output from previous day: 08/08 0701 - 08/09 0700 In: 1180 [P.O.:780; I.V.:400] Out: 900 [Urine:900] Intake/Output this shift:    Medications Current Facility-Administered Medications  Medication Dose Route Frequency Provider Last Rate Last Dose  . acetaminophen (TYLENOL) tablet 650 mg  650 mg Oral Q4H PRN [name], MD      . ALPRAZolam Prudy Feeler) tablet 0.25 mg  0.25 mg Oral BID PRN Nada Boozer, NP   0.25 mg at 07/14/13 0332  . amiodarone (PACERONE) tablet 200 mg  200 mg Oral BID Nada Boozer, NP   200 mg at 07/13/13 2126  . aspirin chewable tablet 81 mg  81 mg Oral QHS [name], MD   81 mg at 07/13/13 2126  . atorvastatin (LIPITOR) tablet 40 mg  40 mg Oral q1800 Nada Boozer, NP   40 mg at 07/13/13 1808  . citalopram (CELEXA) tablet 10 mg  10 mg Oral Daily Nada Boozer, NP   10 mg at 07/13/13 1758  . digoxin (LANOXIN) tablet 125 mcg  125 mcg Oral QODAY Nada Boozer, NP   125 mcg at 07/12/13 1120  . furosemide (LASIX) tablet 40 mg  40 mg Oral Daily Marykay Lex, MD   40 mg at 07/12/13 1816  . insulin aspart (novoLOG) injection 0-15 Units  0-15 Units Subcutaneous TID WC Abelino Derrick, PA-C   8 Units at 07/13/13 1806  . insulin aspart (novoLOG) injection 0-5 Units  0-5 Units Subcutaneous QHS Eda Paschal [place], PA-C   3 Units at 07/13/13 2128  . insulin aspart (novoLOG) injection 8 Units  8 Units Subcutaneous TID AC [name], MD   8 Units at 07/13/13 1809  . insulin glargine (LANTUS) injection 20 Units  20 Units Subcutaneous QHS Eda Paschal [place], [place]   20 Units at 07/13/13 2128  . isosorbide mononitrate (IMDUR) 24 hr tablet 30 mg  30 mg Oral BID Abelino Derrick,  PA-C   30 mg at 07/13/13 2127  . metoprolol succinate (TOPROL-XL) 24 hr tablet 200 mg  200 mg Oral Daily Nada Boozer, NP   200 mg at 07/13/13 1759  . morphine 2 MG/ML injection 1 mg  1 mg Intravenous Q1H PRN [name], MD      . niacin (NIASPAN) CR tablet 1,000 mg  1,000 mg Oral QHS Nada Boozer, NP   1,000 mg at 07/13/13 2126  . nitroGLYCERIN (NITROSTAT) SL tablet 0.4 mg  0.4 mg Sublingual Q5 Min x 3 PRN Nada Boozer, NP      . omega-3 acid ethyl esters (LOVAZA) capsule 1 g  1 g Oral BID Nada Boozer, NP   1 g at 07/13/13 2126  . ondansetron (ZOFRAN) injection 4 mg  4 mg Intravenous Q6H PRN Nada Boozer, NP      . pantoprazole (PROTONIX) EC tablet 40 mg  40 mg Oral Daily Nada Boozer, NP   40 mg at 07/13/13 1801  . ranolazine (RANEXA) 12 hr tablet 500 mg  500 mg Oral BID Nada Boozer, NP   500 mg at 07/13/13 2126  . spironolactone (ALDACTONE) tablet 12.5 mg  12.5 mg Oral Daily Nada Boozer,  NP   12.5 mg at 07/13/13 1759  . Warfarin - Pharmacist Dosing Inpatient   Does not apply q1800 Benny Lennert, RPH      . zolpidem (AMBIEN) tablet 5 mg  5 mg Oral QHS PRN,MR X 1 Nada Boozer, NP        PE: General appearance: alert, cooperative and no distress Lungs: clear to auscultation bilaterally Heart: Reg rhythm and elevated rate.  split S2.  No MM Extremities: No LEE Pulses: 2+ and symmetric Skin: Right wrist cath site: no hematoma or ecchymosis Neurologic: Grossly normal  Lab Results:  No results found for this basename: WBC, HGB, HCT, PLT,  in the last 72 hours BMET  Recent Labs  07/12/13 0505 07/13/13 0545 07/14/13 0405  NA 131* 134* 132*  K 4.7 4.9 4.2  CL 97 95* 99  CO2 22 24 21   GLUCOSE 208* 268* 275*  BUN 27* 23 20  CREATININE 1.47* 1.55* 1.46*  CALCIUM 9.3 9.8 8.9   PT/INR  Recent Labs  07/12/13 0505 07/13/13 0545 07/14/13 0405  LABPROT 26.8* 22.5* 21.8*  INR 2.58* 2.05* 1.97*   Assessment/Plan  Principal Problem:   Unstable angina Active  Problems:   ADENOCARCINOMA, RECTUM radiation/ chemo/ surg 2008   DIABETES MELLITUS   HYPERTENSION   CARDIOMYOPATHY, ISCHEMIC, with BiV ICD, st Jude EF 23%   PAF (paroxysmal atrial fibrillation)   Long term (current) use of anticoagulants   SOB (shortness of breath)   Hyperkalemia   Renal insufficiency- appears to be new   CAD with LCX disease and stents to LAD   Acute on chronic clinical systolic heart failure   Dyslipidemia- (LDL 160, previously in the 50s)  Plan:   SP LHC which revealed: "Ischemic myopathy with a widely patent proximal LAD date and moderate segmental calcified proximal LAD stenosis. The only change in his anatomy from 5 years ago was occlusion of the high first diagonal branch which was severely diffusely diseased previously. There are no culprit vessels. "   SBP is around 100 which apparently is where he usually stays.  He is currently in rapid AF around 110BPM.  No complaints.  He does not typically know when he goes in and out of Fib.   He is supposed to go to [facility] to discuss ablation.   Possible DC.    LOS: 4 days    HAGER, BRYAN 07/14/2013 9:35 AM   Patient seen and examined. Agree with assessment and plan. Cath results noted. Rec further increase of ranolazine to 100 mg bid. Plan for dc later today. Back on coumadin. INR 1.97. Increase lanoxin to .1875 mg for improved AF rate control. For DC today.   Lennette Bihari, MD, [facility] 07/14/2013 10:17 AM

## 2013-07-16 NOTE — Discharge Summary (Signed)
Physician Discharge Summary  Patient ID: [name] MRN: [id number] DOB/AGE: November 28, 1956 57 y.o.  Admit date: 07/10/2013 Discharge date: 07/16/2013  Admission Diagnoses: Unstable angina  Discharge Diagnoses:  Principal Problem:   Unstable angina Active Problems:   ADENOCARCINOMA, RECTUM radiation/ chemo/ surg 2008   DIABETES MELLITUS   HYPERTENSION   CARDIOMYOPATHY, ISCHEMIC, with BiV ICD, st Jude EF 23%   PAF (paroxysmal atrial fibrillation)   Long term (current) use of anticoagulants   SOB (shortness of breath)   Hyperkalemia   Renal insufficiency- appears to be new   CAD with LCX disease and stents to LAD   Acute on chronic clinical systolic heart failure   Dyslipidemia- (LDL 160, previously in the 50s)   Discharged Condition: stable  Hospital Course:  64 yoWM with hx of BiV-ICD with EF now at 23% - ICM, , and hx of stent to LAD. Prior ant. Wall MI 11/1999 with PCI/Stent, EF at that time of 20%. He has has last cath 2009 :  FINAL IMPRESSIONS:  1. Dilated cardiomyopathy, ischemic type.  2. Left anterior descending stent, midportion of the vessel patent  with no more than 30-40% area of in-stent restenosis.  3. Left anterior descending lesions of about 40-50% mid and distal.  4. Patent circumflex.  5. Patent nondominant right coronary artery.  6. Patent renal arteries.  7. Patent infrarenal abdominal aorta.  8. Patent common iliac arteries  Last Nuc. 3013 with extensive scarring from prior infarct LAD and inferior with EF 23%. He has done well without Heart failure. Recently with PAF, he is on coumadin and his Amiodarone has been increased to 400 mg daily and metoprolol to 300 mg daily. He is followed by Dr. Alanda Amass and Dr. Pernell Dupre at [facility]. Possible ablation in the future.   He presented with chest tightness since Sunday. Assoc. With SOB and Nausea and vomiting at times.  In ER on exam no tightness. He also had some wheezes earlier in the week, possible from  increase of BB.   EKG with RBBB, chronic and a fib with occ pacing.  No acute changes. This is first episode of chest tightness since 2009.   He was admitted to telemetry.  Coumadin was held.  IV heparin started when INR < 2.0.  He ruled out for MI.  BNP elevated at 2585.0.  A1C was 11.1.  Diabetes coor consulted and recommended Lantus added and increased to 20u.  Novolog increased to 10u TID.  He underwent LHC which revealed ischemic myopathy with a widely patent proximal LAD date and moderate segmental calcified proximal LAD stenosis. The only change in his anatomy from 5 years ago was occlusion of the high first diagonal branch which was severely diffusely diseased previously. There were no "culprit vessels.   Ranexa was increased to 1000mg  bid.  Coumadin was restarted. The patient will need PCP follow up for diabetes management.  The patient was seen by Dr. Tresa Endo who felt he was stable for DC home.   Consults: Diabetes Coor  Significant Diagnostic Studies: HEMODYNAMICS:  AO SYSTOLIC/AO DIASTOLIC: 102/75  ANGIOGRAPHIC RESULTS:  1. Left main; normal  2. LAD; the entire proximal third of the LAD was fluoroscopically calcified. The proximal third had approximately 50-60% segmental stenosis. The stent was widely patent in the proximal third of the LAD with 30-40% in-stent restenosis". There was a moderate size first diagonal branch and the ramus distribution that was occluded in its proximal portion and filled by collaterals. This was noted to be  highly diseased at his last cath in 2009. There was 50-60% segmental stenosis in the middle third and 70% in the distal/apical third. There were 2 small marginal branches arising from the middle third that had 90% ostial stenoses unchanged from prior cath  3. Left circumflex; dominant with 99% long segmental proximal OM1 stenosis in a small to medium-size vessel unchanged from prior cath  4. Right coronary artery; nondominant with 50% mid and 80% distal  stenosis unchanged from prior cath  5. 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 The only change in his anatomy from 5 years ago was occlusion of the high first diagonal branch which was severely diffusely diseased previously. There are no "culprit vessels. The patient is already on maximal medical therapy. Plans will be continued medical therapy. The sheath was removed and a TR Band was placed on the right wrist to achieve patent hemostasis. The patient left the Cath Lab in stable condition. He'll be gently hydrated and Coumadin will be restarted.  [name] MD, [facility]  07/13/2013  CBC    Component Value Date/Time   WBC 7.4 07/11/2013 0500   WBC 9.3 05/02/2012 0852   RBC 4.50 07/11/2013 0500   RBC 4.68 05/02/2012 0852   HGB 13.9 07/11/2013 0500   HGB 14.6 05/02/2012 0852   HCT 40.7 07/11/2013 0500   HCT 43.6 05/02/2012 0852   PLT 152 07/11/2013 0500   PLT 163 05/02/2012 0852   MCV 90.4 07/11/2013 0500   MCV 93.3 05/02/2012 0852   MCH 30.9 07/11/2013 0500   MCH 31.2 05/02/2012 0852   MCHC 34.2 07/11/2013 0500   MCHC 33.4 05/02/2012 0852   RDW 14.2 07/11/2013 0500   RDW 14.1 05/02/2012 0852   LYMPHSABS 1.3 07/10/2013 1215   LYMPHSABS 1.2 05/02/2012 0852   MONOABS 0.8 07/10/2013 1215   MONOABS 0.6 05/02/2012 0852   EOSABS 0.1 07/10/2013 1215   EOSABS 0.0 05/02/2012 0852   BASOSABS 0.0 07/10/2013 1215   BASOSABS 0.0 05/02/2012 0852     BMET    Component Value Date/Time   NA 132* 07/14/2013 0405   K 4.2 07/14/2013 0405   CL 99 07/14/2013 0405   CO2 21 07/14/2013 0405   GLUCOSE 275* 07/14/2013 0405   BUN 20 07/14/2013 0405   CREATININE 1.46* 07/14/2013 0405   CREATININE 1.72* 06/18/2013 1006   CALCIUM 8.9 07/14/2013 0405   GFRNONAA 51* 07/14/2013 0405   GFRAA 59* 07/14/2013 0405     Treatments:   Discharge Exam: Blood pressure 102/72, pulse 115, temperature 97.8 F (36.6  C), temperature source Oral, resp. rate 18, height 5\' 10"  (1.778 m), weight 226 lb 8 oz (102.74 kg), SpO2 96.00%.   Disposition: 01-Home or Self Care  Discharge Orders   Future Appointments Provider Department Dept Phone   07/17/2013 2:30 PM Delcie Roch [facility] MEDICAL ONCOLOGY [phone]   07/17/2013 3:00 PM Ladene Artist, MD [facility] [phone]   Future Orders Complete By Expires     Diet - low sodium heart healthy  As directed     Discharge instructions  As directed     Comments:      No lifting more than a half gallon of milk with your right arm for three days.    Increase activity slowly  As directed         Medication List  STOP taking these medications       benazepril 40 MG tablet  Commonly known as:  LOTENSIN      TAKE these medications       amiodarone 200 MG tablet  Commonly known as:  PACERONE  Take 200 mg by mouth daily.     aspirin 81 MG EC tablet  Take 81 mg by mouth at bedtime.     citalopram 10 MG tablet  Commonly known as:  CELEXA  Take 10 mg by mouth Daily.     co-enzyme Q-10 50 MG capsule  Take 50 mg by mouth daily.     COUMADIN 2.5 MG tablet  Generic drug:  warfarin  Take 2.5 mg by mouth every evening.     CRESTOR 20 MG tablet  Generic drug:  rosuvastatin  Take 20 mg by mouth daily.     Digoxin 187.5 MCG Tabs  Take 187.5 mcg by mouth every other day.     enalapril 10 MG tablet  Commonly known as:  VASOTEC  Take 10 mg by mouth 2 (two) times daily.     Fish Oil 1200 MG Caps  Take 1,200 mg by mouth 2 (two) times daily.     furosemide 20 MG tablet  Commonly known as:  LASIX  Take 1 tablet (20 mg total) by mouth 2 (two) times daily.     insulin aspart 100 UNIT/ML injection  Commonly known as:  novoLOG  - Inject 12-14 Units into the skin 3 (three) times daily before meals. 14 units at breakfast and 12 units a lunch and 12 units at supper per sliding scale  -       insulin glargine 100 UNIT/ML injection  Commonly known as:  LANTUS  Inject 24 Units into the skin at bedtime.     isosorbide mononitrate 30 MG 24 hr tablet  Commonly known as:  IMDUR  Take 0.5 tablets (15 mg total) by mouth 2 (two) times daily.     metFORMIN 500 MG tablet  Commonly known as:  GLUCOPHAGE  Take 1 tablet (500 mg total) by mouth 2 (two) times daily with a meal.     metoprolol 200 MG 24 hr tablet  Commonly known as:  TOPROL-XL  Take 200 mg by mouth daily.     morphine 2 MG/ML injection  Inject 0.5 mLs (1 mg total) into the vein every hour as needed.     niacin 1000 MG CR tablet  Commonly known as:  NIASPAN  Take 1,000 mg by mouth at bedtime.     nitroGLYCERIN 0.4 MG/SPRAY spray  Commonly known as:  NITROLINGUAL  Place 1 spray under the tongue every 5 (five) minutes as needed. angina     pantoprazole 40 MG tablet  Commonly known as:  PROTONIX  Take 40 mg by mouth Daily.     ranolazine 1000 MG SR tablet  Commonly known as:  RANEXA  Take 1 tablet (1,000 mg total) by mouth 2 (two) times daily.     spironolactone 25 MG tablet  Commonly known as:  ALDACTONE  Take 12.5 mg by mouth daily.     vitamin C 500 MG tablet  Commonly known as:  ASCORBIC ACID  Take 500 mg by mouth daily.           Follow-up Information   Follow up with [facility], Pearletha Furl, MD. (Our office will call you with the scheduled appt. date and time. )    Contact information:   [address]  [address] [place] [address] [phone]      Greater than 30 minutes was spent completing the patient's discharge.   SignedWilburt Finlay 07/16/2013, 4:48 PM

## 2013-07-17 ENCOUNTER — Other Ambulatory Visit (HOSPITAL_BASED_OUTPATIENT_CLINIC_OR_DEPARTMENT_OTHER): Payer: Medicare Other

## 2013-07-17 ENCOUNTER — Telehealth: Payer: Self-pay | Admitting: Oncology

## 2013-07-17 ENCOUNTER — Ambulatory Visit (HOSPITAL_BASED_OUTPATIENT_CLINIC_OR_DEPARTMENT_OTHER): Payer: Medicare Other | Admitting: Oncology

## 2013-07-17 VITALS — BP 88/54 | HR 105 | Temp 96.7°F | Resp 18 | Ht 70.0 in | Wt 228.2 lb

## 2013-07-17 DIAGNOSIS — C61 Malignant neoplasm of prostate: Secondary | ICD-10-CM

## 2013-07-17 DIAGNOSIS — C20 Malignant neoplasm of rectum: Secondary | ICD-10-CM | POA: Diagnosis not present

## 2013-07-17 DIAGNOSIS — I2589 Other forms of chronic ischemic heart disease: Secondary | ICD-10-CM | POA: Diagnosis not present

## 2013-07-17 NOTE — Progress Notes (Signed)
   [facility]    OFFICE PROGRESS NOTE   INTERVAL HISTORY:   He returns as scheduled. He was admitted on 07/10/2013 with chest tightness and dyspnea. A cardiac evaluation included a repeat catheterization. He denies dyspnea today. He reports an ablation procedure is being considered for treatment of an arrhythmia.  He feels well today.  Objective:  Vital signs in last 24 hours:  Blood pressure 88/54, pulse 105, temperature 96.7 F (35.9 C), temperature source Oral, resp. rate 18, height 5\' 10"  (1.778 m), weight 228 lb 3.2 oz (103.511 kg).    HEENT: Neck without mass Lymphatics: No cervical, supraclavicular, or axillary nodes. Prominent bilateral axillary fat pads.? 1 cm medial left inguinal node. Resp: Lungs clear bilaterally Cardio: Irregular GI: No hepatosplenomegaly Vascular: No leg edema  Skin: Perineal scar without evidence of recurrent tumor   Portacath/PICC-without erythema  Lab Results:  Lab Results  Component Value Date   WBC 7.4 07/11/2013   HGB 13.9 07/11/2013   HCT 40.7 07/11/2013   MCV 90.4 07/11/2013   PLT 152 07/11/2013      Medications: I have reviewed the patient's current medications.  Assessment/Plan: 1. Stage III rectal cancer, diagnosed in June of 2008: Status post neoadjuvant infusional 5-FU and concurrent radiation. He underwent an APR 10/04/2007 with the pathology confirming stage III disease. He completed 8 cycles of adjuvant FOLFOX therapy on 03/12/2008. A restaging CT 05/26/2010 showed no evidence of metastatic disease. He underwent a colonoscopy in October of 2012 with removal of a single pedunculated polyp-benign pathology 2. Prostate cancer: Status post radiation and 2 years of Lupron therapy per Dr. Vonita Moss. 3. History of thrombocytopenia secondary to chemotherapy. The platelet count was normal on 05/02/12. 4. History of delayed nausea secondary to chemotherapy: Improved with Aloxi. 5. History of oxaliplatin  neuropathy. 6. Ischemic cardiomyopathy followed by Dr. Alanda Amass. 7. History of bilateral axillary fullness. 8. Diabetes. 9. Status post Port-A-Cath removal. 10. Hospitalization with pneumonia October 2012. 11. Question small bilateral inguinal lymph nodes on exam 04/17/2013-? Small left inguinal node today.  Disposition:  He remains in clinical remission from rectal cancer. I have a low clinical suspicion for metastatic lymphadenopathy. He will return for an office visit and CEA in 6 months.   Thornton Papas, MD  07/17/2013  3:52 PM

## 2013-07-17 NOTE — Telephone Encounter (Signed)
gv and printed appt sched adn avs for pt °

## 2013-07-18 LAB — CEA: CEA: 1.3 ng/mL (ref 0.0–5.0)

## 2013-07-20 ENCOUNTER — Encounter (HOSPITAL_COMMUNITY): Admission: RE | Payer: Self-pay | Source: Ambulatory Visit

## 2013-07-20 ENCOUNTER — Ambulatory Visit (HOSPITAL_COMMUNITY): Admission: RE | Admit: 2013-07-20 | Payer: Medicare Other | Source: Ambulatory Visit | Admitting: Cardiovascular Disease

## 2013-07-20 ENCOUNTER — Telehealth: Payer: Self-pay | Admitting: *Deleted

## 2013-07-20 DIAGNOSIS — C20 Malignant neoplasm of rectum: Secondary | ICD-10-CM

## 2013-07-20 SURGERY — LEFT HEART CATHETERIZATION WITH CORONARY ANGIOGRAM
Anesthesia: LOCAL

## 2013-07-20 NOTE — Telephone Encounter (Signed)
Left msg on cell ph vm with instructions.  SLJ

## 2013-07-20 NOTE — Telephone Encounter (Signed)
Message copied by Caren Griffins on Fri Jul 20, 2013  2:57 PM ------      Message from: Wandalee Ferdinand      Created: Fri Jul 20, 2013  2:37 PM                   ----- Message -----         From: Ladene Artist, MD         Sent: 07/18/2013   8:38 PM           To: Wandalee Ferdinand, RN, Glori Luis, RN, #            Please call patient, cea is normal, but increased in the normal range over the past year.  Repeat 73mo. ------

## 2013-07-23 ENCOUNTER — Telehealth: Payer: Self-pay

## 2013-07-23 NOTE — Telephone Encounter (Signed)
lvm for pt regarding to November lab and Feb 2015 appt....mailed pt appt sched/avs and letter

## 2013-07-29 ENCOUNTER — Inpatient Hospital Stay (HOSPITAL_COMMUNITY)
Admission: EM | Admit: 2013-07-29 | Discharge: 2013-08-01 | DRG: 291 | Disposition: A | Discharge status: Home / Self Care | Payer: Medicare Other | Attending: Internal Medicine | Admitting: Internal Medicine

## 2013-07-29 ENCOUNTER — Encounter (HOSPITAL_COMMUNITY): Payer: Self-pay

## 2013-07-29 ENCOUNTER — Emergency Department (HOSPITAL_COMMUNITY): Payer: Medicare Other

## 2013-07-29 DIAGNOSIS — I509 Heart failure, unspecified: Secondary | ICD-10-CM

## 2013-07-29 DIAGNOSIS — J9600 Acute respiratory failure, unspecified whether with hypoxia or hypercapnia: Secondary | ICD-10-CM | POA: Diagnosis present

## 2013-07-29 DIAGNOSIS — Z9119 Patient's noncompliance with other medical treatment and regimen: Secondary | ICD-10-CM | POA: Diagnosis not present

## 2013-07-29 DIAGNOSIS — I2510 Atherosclerotic heart disease of native coronary artery without angina pectoris: Secondary | ICD-10-CM

## 2013-07-29 DIAGNOSIS — Z9861 Coronary angioplasty status: Secondary | ICD-10-CM | POA: Diagnosis not present

## 2013-07-29 DIAGNOSIS — IMO0001 Reserved for inherently not codable concepts without codable children: Secondary | ICD-10-CM | POA: Diagnosis present

## 2013-07-29 DIAGNOSIS — I5023 Acute on chronic systolic (congestive) heart failure: Secondary | ICD-10-CM | POA: Diagnosis not present

## 2013-07-29 DIAGNOSIS — Z794 Long term (current) use of insulin: Secondary | ICD-10-CM | POA: Diagnosis not present

## 2013-07-29 DIAGNOSIS — I4891 Unspecified atrial fibrillation: Secondary | ICD-10-CM | POA: Diagnosis not present

## 2013-07-29 DIAGNOSIS — I2589 Other forms of chronic ischemic heart disease: Secondary | ICD-10-CM

## 2013-07-29 DIAGNOSIS — E119 Type 2 diabetes mellitus without complications: Secondary | ICD-10-CM | POA: Diagnosis not present

## 2013-07-29 DIAGNOSIS — E669 Obesity, unspecified: Secondary | ICD-10-CM | POA: Diagnosis present

## 2013-07-29 DIAGNOSIS — I252 Old myocardial infarction: Secondary | ICD-10-CM

## 2013-07-29 DIAGNOSIS — N289 Disorder of kidney and ureter, unspecified: Secondary | ICD-10-CM | POA: Diagnosis not present

## 2013-07-29 DIAGNOSIS — R0902 Hypoxemia: Secondary | ICD-10-CM | POA: Diagnosis not present

## 2013-07-29 DIAGNOSIS — J90 Pleural effusion, not elsewhere classified: Secondary | ICD-10-CM | POA: Diagnosis not present

## 2013-07-29 DIAGNOSIS — Z6833 Body mass index (BMI) 33.0-33.9, adult: Secondary | ICD-10-CM | POA: Diagnosis not present

## 2013-07-29 DIAGNOSIS — R791 Abnormal coagulation profile: Secondary | ICD-10-CM | POA: Diagnosis present

## 2013-07-29 DIAGNOSIS — Z8546 Personal history of malignant neoplasm of prostate: Secondary | ICD-10-CM | POA: Diagnosis not present

## 2013-07-29 DIAGNOSIS — J189 Pneumonia, unspecified organism: Secondary | ICD-10-CM | POA: Diagnosis not present

## 2013-07-29 DIAGNOSIS — J984 Other disorders of lung: Secondary | ICD-10-CM | POA: Diagnosis not present

## 2013-07-29 DIAGNOSIS — Z95810 Presence of automatic (implantable) cardiac defibrillator: Secondary | ICD-10-CM

## 2013-07-29 DIAGNOSIS — K219 Gastro-esophageal reflux disease without esophagitis: Secondary | ICD-10-CM | POA: Diagnosis present

## 2013-07-29 DIAGNOSIS — I129 Hypertensive chronic kidney disease with stage 1 through stage 4 chronic kidney disease, or unspecified chronic kidney disease: Secondary | ICD-10-CM | POA: Diagnosis present

## 2013-07-29 DIAGNOSIS — Z85048 Personal history of other malignant neoplasm of rectum, rectosigmoid junction, and anus: Secondary | ICD-10-CM

## 2013-07-29 DIAGNOSIS — E785 Hyperlipidemia, unspecified: Secondary | ICD-10-CM

## 2013-07-29 DIAGNOSIS — Z91199 Patient's noncompliance with other medical treatment and regimen due to unspecified reason: Secondary | ICD-10-CM

## 2013-07-29 DIAGNOSIS — I10 Essential (primary) hypertension: Secondary | ICD-10-CM

## 2013-07-29 DIAGNOSIS — J9601 Acute respiratory failure with hypoxia: Secondary | ICD-10-CM

## 2013-07-29 DIAGNOSIS — N183 Chronic kidney disease, stage 3 unspecified: Secondary | ICD-10-CM | POA: Diagnosis present

## 2013-07-29 DIAGNOSIS — Z7901 Long term (current) use of anticoagulants: Secondary | ICD-10-CM | POA: Diagnosis not present

## 2013-07-29 DIAGNOSIS — E876 Hypokalemia: Secondary | ICD-10-CM | POA: Diagnosis not present

## 2013-07-29 DIAGNOSIS — R0602 Shortness of breath: Secondary | ICD-10-CM | POA: Diagnosis not present

## 2013-07-29 DIAGNOSIS — J9819 Other pulmonary collapse: Secondary | ICD-10-CM | POA: Diagnosis not present

## 2013-07-29 DIAGNOSIS — I480 Paroxysmal atrial fibrillation: Secondary | ICD-10-CM | POA: Diagnosis present

## 2013-07-29 DIAGNOSIS — I255 Ischemic cardiomyopathy: Secondary | ICD-10-CM | POA: Diagnosis present

## 2013-07-29 LAB — BASIC METABOLIC PANEL
Calcium: 9.4 mg/dL (ref 8.4–10.5)
Creatinine, Ser: 1.47 mg/dL — ABNORMAL HIGH (ref 0.50–1.35)
GFR calc Af Amer: 59 mL/min — ABNORMAL LOW (ref >90)
GFR calc non Af Amer: 51 mL/min — ABNORMAL LOW (ref >90)

## 2013-07-29 LAB — CBC WITH DIFFERENTIAL/PLATELET
Basophils Absolute: 0 10*3/uL (ref 0.0–0.1)
Basophils Relative: 0 % (ref 0–1)
Eosinophils Absolute: 0.1 10*3/uL (ref 0.0–0.7)
Eosinophils Relative: 1 % (ref 0–5)
HCT: 43.8 % (ref 39.0–52.0)
MCHC: 31.5 g/dL (ref 30.0–36.0)
MCV: 93.6 fL (ref 78.0–100.0)
Monocytes Absolute: 0.7 10*3/uL (ref 0.1–1.0)
Neutro Abs: 11.4 10*3/uL — ABNORMAL HIGH (ref 1.7–7.7)
RDW: 14.7 % (ref 11.5–15.5)

## 2013-07-29 LAB — MRSA PCR SCREENING: MRSA by PCR: NEGATIVE

## 2013-07-29 LAB — GLUCOSE, CAPILLARY: Glucose-Capillary: 278 mg/dL — ABNORMAL HIGH (ref 70–99)

## 2013-07-29 LAB — TROPONIN I
Troponin I: <0.3 ng/mL (ref <0.30)
Troponin I: <0.3 ng/mL (ref <0.30)

## 2013-07-29 LAB — PROTIME-INR
INR: 2.66 — ABNORMAL HIGH (ref 0.00–1.49)
Prothrombin Time: 27.4 seconds — ABNORMAL HIGH (ref 11.6–15.2)

## 2013-07-29 MED ORDER — AMIODARONE HCL 200 MG PO TABS
200.0000 mg | ORAL_TABLET | Freq: Two times a day (BID) | ORAL | Status: DC
  Administered 2013-07-29 – 2013-08-01 (×6): 200 mg via ORAL
  Filled 2013-07-29 (×7): qty 1

## 2013-07-29 MED ORDER — CITALOPRAM HYDROBROMIDE 20 MG PO TABS
10.0000 mg | ORAL_TABLET | Freq: Every day | ORAL | Status: DC
  Administered 2013-07-30 – 2013-08-01 (×3): 10 mg via ORAL
  Filled 2013-07-29 (×4): qty 1

## 2013-07-29 MED ORDER — FUROSEMIDE 10 MG/ML IJ SOLN
60.0000 mg | Freq: Once | INTRAMUSCULAR | Status: AC
  Administered 2013-07-29: 60 mg via INTRAVENOUS
  Filled 2013-07-29: qty 6

## 2013-07-29 MED ORDER — INSULIN GLARGINE 100 UNIT/ML ~~LOC~~ SOLN
SUBCUTANEOUS | Status: AC
  Filled 2013-07-29: qty 10

## 2013-07-29 MED ORDER — NITROGLYCERIN 2 % TD OINT
1.0000 [in_us] | TOPICAL_OINTMENT | Freq: Once | TRANSDERMAL | Status: AC
  Administered 2013-07-29: 1 [in_us] via TOPICAL
  Filled 2013-07-29: qty 1

## 2013-07-29 MED ORDER — WARFARIN - PHARMACIST DOSING INPATIENT
Freq: Every day | Status: DC
  Administered 2013-07-29: 18:00:00

## 2013-07-29 MED ORDER — INSULIN GLARGINE 100 UNIT/ML ~~LOC~~ SOLN
24.0000 [IU] | Freq: Every day | SUBCUTANEOUS | Status: DC
  Administered 2013-07-29 – 2013-07-31 (×3): 24 [IU] via SUBCUTANEOUS
  Filled 2013-07-29 (×4): qty 0.24

## 2013-07-29 MED ORDER — ISOSORBIDE MONONITRATE ER 30 MG PO TB24
15.0000 mg | ORAL_TABLET | Freq: Two times a day (BID) | ORAL | Status: DC
  Administered 2013-07-29 – 2013-08-01 (×6): 15 mg via ORAL
  Filled 2013-07-29 (×7): qty 1

## 2013-07-29 MED ORDER — RANOLAZINE ER 500 MG PO TB12
2000.0000 mg | ORAL_TABLET | Freq: Two times a day (BID) | ORAL | Status: DC
  Administered 2013-07-29 – 2013-07-30 (×2): 2000 mg via ORAL
  Filled 2013-07-29 (×6): qty 4

## 2013-07-29 MED ORDER — INSULIN ASPART 100 UNIT/ML ~~LOC~~ SOLN
0.0000 [IU] | Freq: Three times a day (TID) | SUBCUTANEOUS | Status: DC
  Administered 2013-07-29: 11 [IU] via SUBCUTANEOUS
  Administered 2013-07-30 (×3): 4 [IU] via SUBCUTANEOUS
  Administered 2013-07-31 – 2013-08-01 (×4): 3 [IU] via SUBCUTANEOUS
  Administered 2013-08-01: 7 [IU] via SUBCUTANEOUS

## 2013-07-29 MED ORDER — SODIUM CHLORIDE 0.9 % IV SOLN: 250.0000 mL | INTRAVENOUS | Status: DC | PRN

## 2013-07-29 MED ORDER — VANCOMYCIN HCL IN DEXTROSE 1-5 GM/200ML-% IV SOLN
1000.0000 mg | INTRAVENOUS | Status: AC
  Administered 2013-07-29 (×2): 1000 mg via INTRAVENOUS
  Filled 2013-07-29 (×2): qty 200

## 2013-07-29 MED ORDER — ALBUTEROL SULFATE (5 MG/ML) 0.5% IN NEBU
INHALATION_SOLUTION | RESPIRATORY_TRACT | Status: AC
  Administered 2013-07-29: 5 mg via RESPIRATORY_TRACT
  Filled 2013-07-29: qty 1

## 2013-07-29 MED ORDER — ONDANSETRON HCL 4 MG/2ML IJ SOLN: 4.0000 mg | Freq: Four times a day (QID) | INTRAMUSCULAR | Status: DC | PRN

## 2013-07-29 MED ORDER — INSULIN ASPART 100 UNIT/ML ~~LOC~~ SOLN
10.0000 [IU] | Freq: Three times a day (TID) | SUBCUTANEOUS | Status: DC
  Administered 2013-07-29 – 2013-08-01 (×8): 10 [IU] via SUBCUTANEOUS

## 2013-07-29 MED ORDER — PANTOPRAZOLE SODIUM 40 MG PO TBEC
40.0000 mg | DELAYED_RELEASE_TABLET | Freq: Every day | ORAL | Status: DC
Start: 2013-07-30 — End: 2013-08-01
  Administered 2013-07-30 – 2013-08-01 (×3): 40 mg via ORAL
  Filled 2013-07-29 (×4): qty 1

## 2013-07-29 MED ORDER — FUROSEMIDE 10 MG/ML IJ SOLN
40.0000 mg | Freq: Two times a day (BID) | INTRAMUSCULAR | Status: DC
  Administered 2013-07-29 – 2013-07-30 (×2): 40 mg via INTRAVENOUS
  Filled 2013-07-29 (×2): qty 4

## 2013-07-29 MED ORDER — VANCOMYCIN HCL 10 G IV SOLR
1500.0000 mg | INTRAVENOUS | Status: DC
  Administered 2013-07-30: 1500 mg via INTRAVENOUS
  Filled 2013-07-29: qty 1500

## 2013-07-29 MED ORDER — NITROGLYCERIN 0.4 MG/SPRAY TL SOLN
1.0000 | Status: DC | PRN
  Filled 2013-07-29: qty 4.9

## 2013-07-29 MED ORDER — DEXTROSE 5 % IV SOLN
1.0000 g | Freq: Three times a day (TID) | INTRAVENOUS | Status: DC
  Administered 2013-07-29 – 2013-07-31 (×6): 1 g via INTRAVENOUS
  Filled 2013-07-29 (×6): qty 1

## 2013-07-29 MED ORDER — VANCOMYCIN HCL IN DEXTROSE 1-5 GM/200ML-% IV SOLN
INTRAVENOUS | Status: AC
  Filled 2013-07-29: qty 400

## 2013-07-29 MED ORDER — IPRATROPIUM BROMIDE 0.02 % IN SOLN: 0.5000 mg | Freq: Once | RESPIRATORY_TRACT | Status: AC

## 2013-07-29 MED ORDER — METOPROLOL SUCCINATE ER 50 MG PO TB24
200.0000 mg | ORAL_TABLET | Freq: Every day | ORAL | Status: DC
  Filled 2013-07-29 (×3): qty 4

## 2013-07-29 MED ORDER — ACETAMINOPHEN 325 MG PO TABS: 650.0000 mg | ORAL_TABLET | ORAL | Status: DC | PRN

## 2013-07-29 MED ORDER — SODIUM CHLORIDE 0.9 % IJ SOLN
3.0000 mL | Freq: Two times a day (BID) | INTRAMUSCULAR | Status: DC
  Administered 2013-07-29 – 2013-08-01 (×4): 3 mL via INTRAVENOUS

## 2013-07-29 MED ORDER — IPRATROPIUM BROMIDE 0.02 % IN SOLN
RESPIRATORY_TRACT | Status: AC
  Administered 2013-07-29: 0.5 mg via RESPIRATORY_TRACT
  Filled 2013-07-29: qty 2.5

## 2013-07-29 MED ORDER — ALBUTEROL SULFATE (5 MG/ML) 0.5% IN NEBU: 5.0000 mg | INHALATION_SOLUTION | Freq: Once | RESPIRATORY_TRACT | Status: AC

## 2013-07-29 MED ORDER — ENALAPRIL MALEATE 5 MG PO TABS
10.0000 mg | ORAL_TABLET | Freq: Two times a day (BID) | ORAL | Status: DC
  Administered 2013-07-29 – 2013-08-01 (×3): 10 mg via ORAL
  Filled 2013-07-29 (×7): qty 2

## 2013-07-29 MED ORDER — ASPIRIN EC 81 MG PO TBEC
81.0000 mg | DELAYED_RELEASE_TABLET | Freq: Every day | ORAL | Status: DC
  Administered 2013-07-29 – 2013-07-31 (×3): 81 mg via ORAL
  Filled 2013-07-29 (×4): qty 1

## 2013-07-29 MED ORDER — INSULIN ASPART 100 UNIT/ML ~~LOC~~ SOLN
0.0000 [IU] | Freq: Every day | SUBCUTANEOUS | Status: DC
  Administered 2013-07-29: 4 [IU] via SUBCUTANEOUS

## 2013-07-29 MED ORDER — SODIUM CHLORIDE 0.9 % IJ SOLN: 3.0000 mL | INTRAMUSCULAR | Status: DC | PRN

## 2013-07-29 MED ORDER — RANOLAZINE ER 500 MG PO TB12
ORAL_TABLET | ORAL | Status: AC
  Filled 2013-07-29: qty 4

## 2013-07-29 MED ORDER — DEXTROSE 5 % IV SOLN
INTRAVENOUS | Status: AC
  Filled 2013-07-29 (×2): qty 1

## 2013-07-29 MED ORDER — SPIRONOLACTONE 25 MG PO TABS
12.5000 mg | ORAL_TABLET | Freq: Every day | ORAL | Status: DC
  Administered 2013-08-01: 12.5 mg via ORAL
  Filled 2013-07-29 (×3): qty 1

## 2013-07-29 MED ORDER — ATORVASTATIN CALCIUM 40 MG PO TABS
40.0000 mg | ORAL_TABLET | Freq: Every day | ORAL | Status: DC
  Administered 2013-07-30 – 2013-07-31 (×2): 40 mg via ORAL
  Filled 2013-07-29 (×2): qty 1

## 2013-07-29 MED ORDER — DIGOXIN 125 MCG PO TABS
187.5000 ug | ORAL_TABLET | ORAL | Status: DC
  Administered 2013-07-30 – 2013-08-01 (×2): 187.5 ug via ORAL
  Filled 2013-07-29 (×3): qty 2

## 2013-07-29 MED ORDER — WARFARIN SODIUM 2.5 MG PO TABS
2.5000 mg | ORAL_TABLET | Freq: Once | ORAL | Status: AC
  Administered 2013-07-29: 2.5 mg via ORAL
  Filled 2013-07-29: qty 1

## 2013-07-29 NOTE — Progress Notes (Signed)
ANTICOAGULATION CONSULT NOTE - Initial Consult  Pharmacy Consult for Coumadin Indication: atrial fibrillation  No Known Allergies  Patient Measurements: Height: 5\' 10"  (177.8 cm) Weight: 227 lb 15.3 oz (103.4 kg) IBW/kg (Calculated) : 73  Vital Signs: Temp: 98.5 F (36.9 C) (08/24 1509) Temp src: Oral (08/24 1509) BP: 108/63 mmHg (08/24 1515) Pulse Rate: 65 (08/24 1515)  Labs:  Recent Labs  07/29/13 1158  HGB 13.8  HCT 43.8  PLT 232  CREATININE 1.47*  TROPONINI <0.30    Estimated Creatinine Clearance: 66 ml/min (by C-G formula based on Cr of 1.47).   Medical History: Past Medical History  Diagnosis Date  . Unspecified essential hypertension   . Type II or unspecified type diabetes mellitus without mention of complication, not stated as uncontrolled   . 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 CAD (coronary artery disease)     hx Ant MI 2000 with stent to LAD,  stent to LAD again in 2009. last cath 2009  . S/P colonoscopy 08/26/2008    Normal via ostomy  . CHF (congestive heart failure)   . Acute myocardial infarction, unspecified site, episode of care unspecified 2000  . 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 Prostate cancer   . GERD (gastroesophageal reflux disease)   . Cardiomyopathy, ischemic     with Hx of BIV ICD St. Jude EF 23%    Medications:  Scheduled:  . amiodarone  200 mg Oral BID  . aspirin EC  81 mg Oral QHS  . [START ON 07/30/2013] atorvastatin  40 mg Oral q1800  . ceFEPime (MAXIPIME) IV  1 g Intravenous Q8H  . [START ON 07/30/2013] citalopram  10 mg Oral Daily  . [START ON 07/30/2013] digoxin  187.5 mcg Oral QODAY  . enalapril  10 mg Oral BID  . furosemide  40 mg Intravenous Q12H  . insulin aspart  0-20 Units Subcutaneous TID WC  . insulin aspart  0-5 Units Subcutaneous QHS  . insulin aspart  10 Units Subcutaneous TID WC  . insulin glargine  24 Units Subcutaneous QHS  . isosorbide mononitrate   15 mg Oral BID  . [START ON 07/30/2013] metoprolol  200 mg Oral Daily  . [START ON 07/30/2013] pantoprazole  40 mg Oral Daily  . ranolazine  2,000 mg Oral BID  . sodium chloride  3 mL Intravenous Q12H  . [START ON 07/30/2013] spironolactone  12.5 mg Oral Daily  . [START ON 07/30/2013] vancomycin  1,500 mg Intravenous Q24H  . vancomycin  1,000 mg Intravenous Q1 Hr x 2    Assessment: 57 yo M on chronic warfarin 2.5mg  daily for Afib.   INR on admission is therapeutic.  No bleeding noted.   Goal of Therapy:  INR 2-3   Plan:  1) Coumadin 2.5mg  po x1 today 2) Daily INR  Elson Clan 07/29/2013,4:09 PM

## 2013-07-29 NOTE — Progress Notes (Signed)
ANTIBIOTIC CONSULT NOTE - INITIAL  Pharmacy Consult for Vancomycin & renal dose adjustments Indication: pneumonia  No Known Allergies  Patient Measurements: Height: 5\' 10"  (177.8 cm) Weight: 227 lb 15.3 oz (103.4 kg) IBW/kg (Calculated) : 73  Vital Signs: Temp: 98.5 F (36.9 C) (08/24 1509) Temp src: Oral (08/24 1509) BP: 108/63 mmHg (08/24 1515) Pulse Rate: 65 (08/24 1515) Intake/Output from previous day:   Intake/Output from this shift: Total I/O In: -  Out: 1725 [Urine:1725]  Labs:  Recent Labs  07/29/13 1158  WBC 13.4*  HGB 13.8  PLT 232  CREATININE 1.47*   Estimated Creatinine Clearance: 66 ml/min (by C-G formula based on Cr of 1.47). No results found for this basename: VANCOTROUGH, VANCOPEAK, VANCORANDOM, GENTTROUGH, GENTPEAK, GENTRANDOM, TOBRATROUGH, TOBRAPEAK, TOBRARND, AMIKACINPEAK, AMIKACINTROU, AMIKACIN,  in the last 72 hours   Microbiology: No results found for this or any previous visit (from the past 720 hour(s)).  Medical History: Past Medical History  Diagnosis Date  . Unspecified essential hypertension   . Type II or unspecified type diabetes mellitus without mention of complication, not stated as uncontrolled   . 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 CAD (coronary artery disease)     hx Ant MI 2000 with stent to LAD,  stent to LAD again in 2009. last cath 2009  . S/P colonoscopy 08/26/2008    Normal via ostomy  . CHF (congestive heart failure)   . Acute myocardial infarction, unspecified site, episode of care unspecified 2000  . 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 Prostate cancer   . GERD (gastroesophageal reflux disease)   . Cardiomyopathy, ischemic     with Hx of BIV ICD St. Jude EF 23%    Medications:  Scheduled:  . amiodarone  200 mg Oral BID  . aspirin EC  81 mg Oral QHS  . [START ON 07/30/2013] atorvastatin  40 mg Oral q1800  . ceFEPime (MAXIPIME) IV  1 g Intravenous Q8H  .  [START ON 07/30/2013] citalopram  10 mg Oral Daily  . [START ON 07/30/2013] digoxin  187.5 mcg Oral QODAY  . enalapril  10 mg Oral BID  . furosemide  40 mg Intravenous Q12H  . insulin aspart  0-20 Units Subcutaneous TID WC  . insulin aspart  0-5 Units Subcutaneous QHS  . insulin aspart  10 Units Subcutaneous TID WC  . insulin glargine  24 Units Subcutaneous QHS  . isosorbide mononitrate  15 mg Oral BID  . [START ON 07/30/2013] metoprolol  200 mg Oral Daily  . [START ON 07/30/2013] pantoprazole  40 mg Oral Daily  . ranolazine  2,000 mg Oral BID  . sodium chloride  3 mL Intravenous Q12H  . [START ON 07/30/2013] spironolactone  12.5 mg Oral Daily  . [START ON 07/30/2013] vancomycin  1,500 mg Intravenous Q24H  . vancomycin  1,000 mg Intravenous Q1 Hr x 2   Assessment: 57 yo obese M recently discharged from hospital presents today with HF exacerbation vs. PNA.  Starting empiric broad-spectrum antibiotics for HCAP.   Patient is noted to have chronic renal insufficiency.  Renal function is at patient's baseline.   Cefepime 8/24>> Vancomycin 8/24>>  Goal of Therapy:  Vancomycin trough level 15-20 mcg/ml  Plan:  1) Continue Cefepime 1gm IV q8h 2) Vancomycin 2gm IV load followed by 1500mg  IV q24h 3) Check Vancomycin trough at steady state 4) Monitor renal function and cx data   Elson Clan 07/29/2013,4:04  PM

## 2013-07-29 NOTE — H&P (Signed)
History and Physical  [name]:[id number] DOB: [birth year] DOA: 07/29/2013  Referring physician: Devoria Albe, MD PCP: Cassell Smiles., MD   Chief Complaint: Short of breath  HPI:  57 year old man with complex past medical history including ischemic cardiomyopathy with pacemaker/defibrillator in place presents the emergency department with sudden onset of shortness of breath this morning. Initial evaluation was notable for acute hypoxic respiratory failure and chest x-ray suggested heart failure. Patient was placed on venimask and referred for admission to SDU.  History obtained from patient, son at bedside in chart review. Discharged 8/11, admitted to [facility] by [facility] for chest pain associated with shortness of breath. Cardiac enzymes are negative. Clinical impression was unstable angina and he underwent left heart catheterization which revealed "ischemic myopathy with a widely patent proximal LAD date and moderate segmental calcified proximal LAD stenosis. The only change in his anatomy from 5 years ago was occlusion of the high first diagonal branch which was severely diffusely diseased previously. There were no "culprit vessels. Ranexa was increased to 1000mg  bid."  Since that time he has done well at home. He has noted some increasing lower extremity edema over the last several days and some slight increase in shortness of breath. He weighs himself several times per week and his weight is usually approximately 221. No dietary indiscretions noted. He went boating yesterday with his son and felt relatively well. This morning after getting up he became suddenly short of breath which worsened with exertion and was associated with frothy sputum production. He went to church but was unable to even get out of the car secondary to shortness of breath. He had no chest pain. No cough before today.  In the emergency department he is noted to be afebrile with normal respiratory rate, hemodynamically  stable but significant hypoxia was noted requiring high flow oxygen. No acute laboratory abnormalities were noted in the screening workup with the exception of mild leukocytosis of 13.4.glucose elevated at 413. EKG showed a paced rhythm. Chest x-ray demonstrated bilateral asymmetric edema or infiltrates.  Review of Systems:  Negative for fever, visual changes, sore throat, rash, new muscle aches, chest pain, dysuria, bleeding, n/v/abdominal pain. Troponin was negative. BNP actually better from previous discharge.  Past Medical History  Diagnosis Date  . Unspecified essential hypertension   . Type II or unspecified type diabetes mellitus without mention of complication, not stated as uncontrolled   . 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 CAD (coronary artery disease)     hx Ant MI 2000 with stent to LAD,  stent to LAD again in 2009. last cath 2009  . S/P colonoscopy 08/26/2008    Normal via ostomy  . CHF (congestive heart failure)   . Acute myocardial infarction, unspecified site, episode of care unspecified 2000  . 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 Prostate cancer   . GERD (gastroesophageal reflux disease)   . Cardiomyopathy, ischemic     with Hx of BIV ICD St. Jude EF 23%    Past Surgical History  Procedure Laterality Date  . Internal defibrillator and pacemaker  2010    x2 St. Jude device  . Abdominal and perineal resection of rectum with total mesorectal excision      10/04/2007  . Colonoscopy      05/17/2007. IMPRESSION: Semilunar, apple-core neoplasm low in the rectum (palpable on digital rectal exam) beginning at 5 cm and corkscrewing up 5 cm in length. This  was a low rectal lesion consistent with colorectal carcinoma. It was biosied multiple times. The upstream colon all the way to the cecum appeared normal. Recommendations: Followup on path. Surgical Consultation   . Coronary angioplasty  2000. 2009    with stents  .  Colonoscopy  09/14/2011    Procedure: COLONOSCOPY;  Surgeon: Corbin Ade, MD;  Location: [facility];  Service: Endoscopy;  Laterality: N/A;  8:30- TCS via colostomy & pt has defibrillator  . Cardiac catheterization  07/13/2013    Social History:  reports that he has never smoked. He has never used smokeless tobacco. He reports that he does not drink alcohol or use illicit drugs.  No Known Allergies  Family History  Problem Relation Age of Onset  . Colon cancer Mother 28  . Colon cancer Sister 19  . Coronary artery disease Father      Prior to Admission medications   Medication Sig Start Date End Date Taking? Authorizing Provider  amiodarone (PACERONE) 200 MG tablet Take 200 mg by mouth 2 (two) times daily.  08/10/11  Yes Historical Provider, MD  aspirin 81 MG EC tablet Take 81 mg by mouth at bedtime.    Yes Historical Provider, MD  citalopram (CELEXA) 10 MG tablet Take 10 mg by mouth Daily.  08/16/11  Yes Historical Provider, MD  co-enzyme Q-10 50 MG capsule Take 50 mg by mouth daily.     Yes Historical Provider, MD  COUMADIN 2.5 MG tablet Take 2.5 mg by mouth every evening.  07/16/11  Yes Historical Provider, MD  CRESTOR 20 MG tablet Take 20 mg by mouth daily.  08/03/11  Yes Historical Provider, MD  digoxin 187.5 MCG TABS Take 187.5 mcg by mouth every other day. 07/16/13  Yes Wilburt Finlay, PA-C  enalapril (VASOTEC) 10 MG tablet Take 10 mg by mouth 2 (two) times daily.  08/12/11  Yes Historical Provider, MD  furosemide (LASIX) 20 MG tablet Take 20 mg by mouth daily. 04/11/12  Yes Gerhard Munch, MD  insulin aspart (NOVOLOG) 100 UNIT/ML injection Inject 12-14 Units into the skin 3 (three) times daily before meals. 14 units at breakfast and 12 units a lunch and 12 units at supper per sliding scale    Yes Historical Provider, MD  insulin glargine (LANTUS) 100 UNIT/ML injection Inject 24 Units into the skin at bedtime.     Yes Historical Provider, MD  isosorbide mononitrate (IMDUR) 30 MG 24 hr  tablet Take 0.5 tablets (15 mg total) by mouth 2 (two) times daily. 05/28/13  Yes Governor Rooks, MD  metFORMIN (GLUCOPHAGE) 500 MG tablet Take 1 tablet (500 mg total) by mouth 2 (two) times daily with a meal. 07/14/13  Yes Wilburt Finlay, PA-C  metoprolol (TOPROL-XL) 200 MG 24 hr tablet Take 200 mg by mouth daily.    Yes Historical Provider, MD  niacin (NIASPAN) 1000 MG CR tablet Take 1,000 mg by mouth at bedtime.     Yes Historical Provider, MD  Omega-3 Fatty Acids (FISH OIL) 1200 MG CAPS Take 1,200 mg by mouth 2 (two) times daily.     Yes Historical Provider, MD  pantoprazole (PROTONIX) 40 MG tablet Take 40 mg by mouth Daily.  07/19/11  Yes Historical Provider, MD  ranolazine (RANEXA) 1000 MG SR tablet Take 2,000 mg by mouth 2 (two) times daily. 07/14/13  Yes Wilburt Finlay, PA-C  spironolactone (ALDACTONE) 25 MG tablet Take 12.5 mg by mouth daily.    Yes Historical Provider, MD  vitamin C (ASCORBIC  ACID) 500 MG tablet Take 500 mg by mouth daily.     Yes Historical Provider, MD  nitroGLYCERIN (NITROLINGUAL) 0.4 MG/SPRAY spray Place 1 spray under the tongue every 5 (five) minutes as needed. angina    Historical Provider, MD   Physical Exam: Filed Vitals:   07/29/13 1245 07/29/13 1300 07/29/13 1315 07/29/13 1330  BP:  130/76  108/69  Pulse: 72 72 68 67  Temp:      TempSrc:      Resp: 24 18 20 21   Height:      Weight:      SpO2: 89% 87% 94% 93%   General: Examined in the emergency department. Appears calm and comfortable. Currently on venimask. Eyes: PERRL, normal lids, irises  ENT: grossly normal hearing, lips & tongue Neck: no LAD, masses or thyromegaly Cardiovascular: RRR, no m/r/g. 1+ bilateral lower extremity edema Telemetry: Paced rhythm, ventricular rate 66. Respiratory: Bilateral rales, right greater than left. Fair air movement. No rhonchi or wheezes. Mild increased respiratory effort. Abdomen: Obese, soft, ntnd. Ostomy pouch in place. Skin: no rash or induration seen   Musculoskeletal: grossly normal tone BUE/BLE Psychiatric: grossly normal mood and affect, speech fluent and appropriate Neurologic: grossly non-focal.  Wt Readings from Last 3 Encounters:  07/29/13 100.245 kg (221 lb)  07/17/13 103.511 kg (228 lb 3.2 oz)  07/13/13 102.74 kg (226 lb 8 oz)    Labs on Admission:  Basic Metabolic Panel:  Recent Labs Lab 07/29/13 1158  NA 133*  K 4.6  CL 96  CO2 23  GLUCOSE 413*  BUN 24*  CREATININE 1.47*  CALCIUM 9.4   CBC:  Recent Labs Lab 07/29/13 1158  WBC 13.4*  NEUTROABS 11.4*  HGB 13.8  HCT 43.8  MCV 93.6  PLT 232    Cardiac Enzymes:  Recent Labs Lab 07/29/13 1158  TROPONINI <0.30     Recent Labs  07/11/13 0500 07/13/13 0545 07/29/13 1158  PROBNP 2123.0* 3829.0* 2014.0*    Radiological Exams on Admission: Dg Chest Portable 1 View  07/29/2013   *RADIOLOGY REPORT*  Clinical Data: Shortness of breath  PORTABLE CHEST - 1 VIEW  Comparison: 07/10/2013  Findings: Left subclavian AICD stable.  New moderate predominately perihilar interstitial and alveolar edema or infiltrates, with patchy areas of more focal consolidation laterally in the right upper lobe and in the left infrahilar region.  No definite effusion.  Stable cardiomegaly.  Degenerative changes of bilateral shoulders.  IMPRESSION:  1.  Worsening bilateral asymmetric edema or infiltrates. 2.  Stable cardiomegaly and postop change.   Original Report Authenticated By: D. Andria Rhein, MD    EKG: Independently reviewed. Paced rhythm. Ventricular rate 71.   Principal Problem:   Acute on chronic clinical systolic heart failure Active Problems:   DIABETES MELLITUS   CARDIOMYOPATHY, ISCHEMIC, with BiV ICD, st Jude EF 23%   PAF (paroxysmal atrial fibrillation)   Acute respiratory failure with hypoxia   Assessment/Plan 57 year old man with history of ischemic cardiomyopathy, ICD/pacemaker, paroxysmal atrial fibrillation, recent admission for unstable angina who  presented with sudden onset of shortness of breath. History and clinical findings most suggestive of acute exacerbation of heart failure without evidence of ACS, however cannot exclude developing pneumonia. Will admit thrive diuresis, cardiology consultation, serial cardiac enzymes. Start empiric antibiotics but low threshold to discontinue if rapidly improved.  1. Acute respiratory failure with hypoxia: Most likely secondary to systolic heart failure exacerbation. Pneumonia considered but felt to be less likely, however will initiate empiric treatment and repeat  chest x-ray in the morning. No chest pain, no history to suggest ACS. Given as below. 2. Acute on chronic systolic congestive heart failure: No dietary indiscretion reported. IV Lasix. Serial cardiac enzymes although no history to suggest ACS. 3. Recent admission for unstable angina: No recurrent chest pain. Troponin negative. Per cardiology recommendations and cath report at that time plan is for medical management. 4. Ischemic cardiomyopathy: Appears stable. Continue Ranexa, Lipitor, Imdur, Vasotec, metoprolol, Aldactone, aspirin. 5. Chronic kidney disease stage III: Appears to be at baseline. 6. Atrial fibrillation: Check PT/INR. Warfarin per pharmacy. Continue digoxin, Pacerone. 7. Diabetes mellitus uncontrolled with hyperglycemia: Discontinue metformin indefinitely. Continue Lantus, NovoLog meal coverage, sliding scale insulin. Hemoglobin A1c 11.1 earlier this month.  Code Status: Full code  DVT prophylaxis:warfarin Family Communication: discussed with son at bedside Disposition Plan/Anticipated LOS: admission, 2-3 days  Time spent: 35 minutes  Brendia Sacks, MD  [facility] Pager [phone] 07/29/2013, 2:46 PM

## 2013-07-29 NOTE — ED Provider Notes (Signed)
CSN: [id number]     Arrival date & time 07/29/13  1124 History    This chart was scribed for Ward Givens, MD,  by Blatter Jacobs, ED Scribe. The patient was seen in room IC02/IC02-01 and the patient's care was started at 12:35 PM.   First MD Initiated Contact with Patient 07/29/13 1215     Chief Complaint  Patient presents with  . Shortness of Breath   (Consider location/radiation/quality/duration/timing/severity/associated sxs/prior Treatment) Patient is a 57 y.o. male presenting with shortness of breath. The history is provided by the patient, medical records, the spouse and the EMS personnel. No language interpreter was used.  Shortness of Breath Severity:  Mild Onset quality:  Sudden Timing:  Constant Progression:  Worsening Chronicity:  New Context: activity   Relieved by:  Nothing Worsened by:  Nothing tried Ineffective treatments:  None tried Associated symptoms: cough and wheezing   Associated symptoms: no abdominal pain and no fever    HPI Comments: JLON BETKER is a 57 y.o. male  presents to the Emergency Department complaining of shortness of breath that worsened this morning after waking up and going to sit in a chair.  Pt had associated symptoms of blood tinged white sputum withcough, moderate congestion and moderate wheezing.  Pt is not on O2 at home and does not use a nebulizer or  Inhaler at home although he has used them while in the hospital. Pt mentions bilateral edema in his ankles and legs with onset of a week ago. Pt mention that nothing seems to worsen or relieve his symptoms. He denies chest pain or fever. He relates he was recently admitted for 4 days when he was having wheezing and he had a cardiac catheterization showing no new blockages and that his stents were intact. He denies nausea, vomiting, diarrhea. He states he noted today his abdomen was swelling. His feet and legs and swelling over the past week. He reports he has been recently found to be in  atrial fibrillation for the past month. He relates they're trying to medically control it with medications and if he does not convert to normal sinus rhythm he is going to have a ablation done at [facility] next month.    PCP Dr [name] Cardiology Dr Alanda Amass  Past Medical History  Diagnosis Date  . Unspecified essential hypertension   . Type II or unspecified type diabetes mellitus without mention of complication, not stated as uncontrolled   . 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 CAD (coronary artery disease)     hx Ant MI 2000 with stent to LAD,  stent to LAD again in 2009. last cath 2009  . S/P colonoscopy 08/26/2008    Normal via ostomy  . CHF (congestive heart failure)   . Acute myocardial infarction, unspecified site, episode of care unspecified 2000  . 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 Prostate cancer   . GERD (gastroesophageal reflux disease)   . Cardiomyopathy, ischemic     with Hx of BIV ICD St. Jude EF 23%   Past Surgical History  Procedure Laterality Date  . Internal defibrillator and pacemaker  2010    x2 St. Jude device  . Abdominal and perineal resection of rectum with total mesorectal excision      10/04/2007  . Colonoscopy      05/17/2007. IMPRESSION: Semilunar, apple-core neoplasm low in the rectum (palpable on digital rectal exam) beginning at 5 cm and corkscrewing  up 5 cm in length. This was a low rectal lesion consistent with colorectal carcinoma. It was biosied multiple times. The upstream colon all the way to the cecum appeared normal. Recommendations: Followup on path. Surgical Consultation   . Coronary angioplasty  2000. 2009    with stents  . Colonoscopy  09/14/2011    Procedure: COLONOSCOPY;  Surgeon: Corbin Ade, MD;  Location: [facility];  Service: Endoscopy;  Laterality: N/A;  8:30- TCS via colostomy & pt has defibrillator  . Cardiac catheterization  07/13/2013   Family History  Problem Relation  Age of Onset  . Colon cancer Mother 58  . Colon cancer Sister 4  . Coronary artery disease Father    History  Substance Use Topics  . Smoking status: Never Smoker   . Smokeless tobacco: Never Used  . Alcohol Use: No     Comment: former user  lives at home Lives with spouse  Review of Systems  Constitutional: Negative for fever, appetite change and unexpected weight change.  HENT: Positive for congestion.   Respiratory: Positive for cough, shortness of breath and wheezing.   Gastrointestinal: Positive for abdominal distention. Negative for abdominal pain.  All other systems reviewed and are negative.    Allergies  Review of patient's allergies indicates no known allergies.  Home Medications   Current Outpatient Rx  Name  Route  Sig  Dispense  Refill  . amiodarone (PACERONE) 200 MG tablet   Oral   Take 200 mg by mouth daily.          Marland Kitchen aspirin 81 MG EC tablet   Oral   Take 81 mg by mouth at bedtime.          . citalopram (CELEXA) 10 MG tablet   Oral   Take 10 mg by mouth Daily.          Marland Kitchen co-enzyme Q-10 50 MG capsule   Oral   Take 50 mg by mouth daily.           Marland Kitchen COUMADIN 2.5 MG tablet   Oral   Take 2.5 mg by mouth every evening.          Marland Kitchen CRESTOR 20 MG tablet   Oral   Take 20 mg by mouth daily.          . digoxin 187.5 MCG TABS   Oral   Take 187.5 mcg by mouth every other day.   30 tablet   5   . enalapril (VASOTEC) 10 MG tablet   Oral   Take 10 mg by mouth 2 (two) times daily.          . furosemide (LASIX) 20 MG tablet   Oral   Take 1 tablet (20 mg total) by mouth 2 (two) times daily.   30 tablet   0   . insulin aspart (NOVOLOG) 100 UNIT/ML injection   Subcutaneous   Inject 12-14 Units into the skin 3 (three) times daily before meals. 14 units at breakfast and 12 units a lunch and 12 units at supper per sliding scale          . insulin glargine (LANTUS) 100 UNIT/ML injection   Subcutaneous   Inject 24 Units into the skin  at bedtime.           . isosorbide mononitrate (IMDUR) 30 MG 24 hr tablet   Oral   Take 0.5 tablets (15 mg total) by mouth 2 (two) times daily.   30 tablet  8   . metFORMIN (GLUCOPHAGE) 500 MG tablet   Oral   Take 1 tablet (500 mg total) by mouth 2 (two) times daily with a meal.           Restart this medication on Monday, Aug 11.   . metoprolol (TOPROL-XL) 200 MG 24 hr tablet   Oral   Take 200 mg by mouth daily.          Marland Kitchen morphine 2 MG/ML injection   Intravenous   Inject 0.5 mLs (1 mg total) into the vein every hour as needed.   1 mL   0   . niacin (NIASPAN) 1000 MG CR tablet   Oral   Take 1,000 mg by mouth at bedtime.           . nitroGLYCERIN (NITROLINGUAL) 0.4 MG/SPRAY spray   Sublingual   Place 1 spray under the tongue every 5 (five) minutes as needed. angina         . Omega-3 Fatty Acids (FISH OIL) 1200 MG CAPS   Oral   Take 1,200 mg by mouth 2 (two) times daily.           . pantoprazole (PROTONIX) 40 MG tablet   Oral   Take 40 mg by mouth Daily.          . ranolazine (RANEXA) 1000 MG SR tablet   Oral   Take 1 tablet (1,000 mg total) by mouth 2 (two) times daily.   60 tablet   5   . spironolactone (ALDACTONE) 25 MG tablet   Oral   Take 12.5 mg by mouth daily.          . vitamin C (ASCORBIC ACID) 500 MG tablet   Oral   Take 500 mg by mouth daily.            BP 124/67  Pulse 71  Temp(Src) 98.6 F (37 C) (Oral)  Resp 18  Ht 5\' 10"  (1.778 m)  Wt 221 lb (100.245 kg)  BMI 31.71 kg/m2  SpO2 88%  Vital signs normal except hypoxia  Physical Exam  Nursing note and vitals reviewed. Constitutional: He is oriented to person, place, and time. He appears well-developed and well-nourished.  Non-toxic appearance. He does not appear ill. No distress.  Pt examined after nebulizer, respiratory tech states he didn't have wheezing, but patient states his breathing is better after the treatment.   HENT:  Head: Normocephalic and atraumatic.   Right Ear: External ear normal.  Left Ear: External ear normal.  Nose: Nose normal. No mucosal edema or rhinorrhea.  Mouth/Throat: Oropharynx is clear and moist and mucous membranes are normal. No dental abscesses or edematous.  Eyes: Conjunctivae and EOM are normal. Pupils are equal, round, and reactive to light.  Neck: Normal range of motion and full passive range of motion without pain. Neck supple.  Cardiovascular: Normal rate, regular rhythm and normal heart sounds.  Exam reveals no gallop and no friction rub.   No murmur heard. Pulmonary/Chest: No respiratory distress. He has no wheezes. He has no rhonchi. He has rales (diffuse). He exhibits no tenderness and no crepitus.  Diffuse rales in all lung fields  Abdominal: Soft. Normal appearance and bowel sounds are normal. He exhibits no distension. There is no tenderness. There is no rebound and no guarding.  Musculoskeletal: Normal range of motion. He exhibits edema. He exhibits no tenderness.  Moves all extremities well. Has swelling in his lower extremities  Neurological: He is alert and oriented  to person, place, and time. He has normal strength. No cranial nerve deficit.  Skin: Skin is warm, dry and intact. No rash noted. No erythema. No pallor.  Psychiatric: He has a normal mood and affect. His speech is normal and behavior is normal. His mood appears not anxious.    ED Course   Medications  albuterol (PROVENTIL) (5 MG/ML) 0.5% nebulizer solution 5 mg (5 mg Nebulization Given 07/29/13 1218)  ipratropium (ATROVENT) nebulizer solution 0.5 mg (0.5 mg Nebulization Given 07/29/13 1217)  furosemide (LASIX) injection 60 mg (60 mg Intravenous Given 07/29/13 1254)  nitroGLYCERIN (NITROGLYN) 2 % ointment 1 inch (1 inch Topical Given 07/29/13 1254)   DIAGNOSTIC STUDIES: Oxygen Saturation is 80% on [place], low by my interpretation.    COORDINATION OF CARE: 12:31 PM Discussed course of care with pt . Pt understands and agrees.  Nursing staff  report his initial pulse ox in triage was 91% however when he was placed in the room it was 80%. He was placed on 4 L which only got his oxygen up to 88 or 89%. He was placed on a Ventimask at 50% and his pulse ox improved to 94-98%. Patient had 1200 cc of urinary output after given IV Lasix.  Patient has history of congestive heart failure and presents with an acute flareup with hypoxia to 80% on room air. He is not on oxygen at home. He is being admitted for further treatment.  14:24 Dr Irene Limbo admit to step down, team 1   Procedures (including critical care time)  Results for orders placed during the hospital encounter of 07/29/13  CBC WITH DIFFERENTIAL      Result Value Range   WBC 13.4 (*) 4.0 - 10.5 K/uL   RBC 4.68  4.22 - 5.81 MIL/uL   Hemoglobin 13.8  13.0 - 17.0 g/dL   HCT 16.1  09.6 - 04.5 %   MCV 93.6  78.0 - 100.0 fL   MCH 29.5  26.0 - 34.0 pg   MCHC 31.5  30.0 - 36.0 g/dL   RDW 40.9  81.1 - 91.4 %   Platelets 232  150 - 400 K/uL   Neutrophils Relative % 85 (*) 43 - 77 %   Neutro Abs 11.4 (*) 1.7 - 7.7 K/uL   Lymphocytes Relative 9 (*) 12 - 46 %   Lymphs Abs 1.2  0.7 - 4.0 K/uL   Monocytes Relative 5  3 - 12 %   Monocytes Absolute 0.7  0.1 - 1.0 K/uL   Eosinophils Relative 1  0 - 5 %   Eosinophils Absolute 0.1  0.0 - 0.7 K/uL   Basophils Relative 0  0 - 1 %   Basophils Absolute 0.0  0.0 - 0.1 K/uL  BASIC METABOLIC PANEL      Result Value Range   Sodium 133 (*) 135 - 145 mEq/L   Potassium 4.6  3.5 - 5.1 mEq/L   Chloride 96  96 - 112 mEq/L   CO2 23  19 - 32 mEq/L   Glucose, Bld 413 (*) 70 - 99 mg/dL   BUN 24 (*) 6 - 23 mg/dL   Creatinine, Ser 7.82 (*) 0.50 - 1.35 mg/dL   Calcium 9.4  8.4 - 95.6 mg/dL   GFR calc non Af Amer 51 (*) >90 mL/min   GFR calc Af Amer 59 (*) >90 mL/min  TROPONIN I      Result Value Range   Troponin I <0.30  <0.30 ng/mL  PRO B NATRIURETIC PEPTIDE  Result Value Range   Pro B Natriuretic peptide (BNP) 2014.0 (*) 0 - 125 pg/mL    Laboratory interpretation all normal except elevated BNP, stable renal insufficiency, hyperglycemia  Dg Chest Portable 1 View  07/29/2013   *RADIOLOGY REPORT*  Clinical Data: Shortness of breath  PORTABLE CHEST - 1 VIEW  Comparison: 07/10/2013  Findings: Left subclavian AICD stable.  New moderate predominately perihilar interstitial and alveolar edema or infiltrates, with patchy areas of more focal consolidation laterally in the right upper lobe and in the left infrahilar region.  No definite effusion.  Stable cardiomegaly.  Degenerative changes of bilateral shoulders.  IMPRESSION:  1.  Worsening bilateral asymmetric edema or infiltrates. 2.  Stable cardiomegaly and postop change.   Original Report Authenticated By: D. Andria Rhein, MD     Date: 07/29/2013  Rate: 71  Rhythm: Atrial sensed pacemaker   Old EKG Reviewed: changes noted from 07/11/2013 was in a fib with rate 92 with native beats    1. CHF (congestive heart failure)   2. Hypoxia   3. Renal insufficiency   4. Acute respiratory failure with hypoxia   5. Acute on chronic clinical systolic heart failure   6. PAF (paroxysmal atrial fibrillation)   7. Type II or unspecified type diabetes mellitus without mention of complication, not stated as uncontrolled    CRITICAL CARE Performed by: Ginelle Bays L Total critical care time: 34 min Critical care time was exclusive of separately billable procedures and treating other patients. Critical care was necessary to treat or prevent imminent or life-threatening deterioration. Critical care was time spent personally by me on the following activities: development of treatment plan with patient and/or surrogate as well as nursing, discussions with consultants, evaluation of patient's response to treatment, examination of patient, obtaining history from patient or surrogate, ordering and performing treatments and interventions, ordering and review of laboratory studies, ordering and review of  radiographic studies, pulse oximetry and re-evaluation of patient's condition.    MDM   I personally performed the services described in this documentation, which was scribed in my presence. The recorded information has been reviewed and considered.  Devoria Albe, MD, Armando Gang    Ward Givens, MD 07/29/13 [phone]

## 2013-07-29 NOTE — ED Notes (Signed)
Pt reports waking this am, went to sit in a chair, and started wheezing and coughing. Also having congestion, denies fever or chest pain, mucus is "orange color".

## 2013-07-30 ENCOUNTER — Inpatient Hospital Stay (HOSPITAL_COMMUNITY): Payer: Medicare Other

## 2013-07-30 DIAGNOSIS — I2589 Other forms of chronic ischemic heart disease: Secondary | ICD-10-CM

## 2013-07-30 DIAGNOSIS — I2510 Atherosclerotic heart disease of native coronary artery without angina pectoris: Secondary | ICD-10-CM

## 2013-07-30 LAB — BASIC METABOLIC PANEL
BUN: 23 mg/dL (ref 6–23)
Calcium: 8.8 mg/dL (ref 8.4–10.5)
Creatinine, Ser: 1.51 mg/dL — ABNORMAL HIGH (ref 0.50–1.35)
GFR calc Af Amer: 57 mL/min — ABNORMAL LOW (ref >90)
GFR calc non Af Amer: 49 mL/min — ABNORMAL LOW (ref >90)
Potassium: 3.7 mEq/L (ref 3.5–5.1)

## 2013-07-30 LAB — GLUCOSE, CAPILLARY
Glucose-Capillary: 186 mg/dL — ABNORMAL HIGH (ref 70–99)
Glucose-Capillary: 183 mg/dL — ABNORMAL HIGH (ref 70–99)

## 2013-07-30 MED ORDER — RANOLAZINE ER 500 MG PO TB12
1000.0000 mg | ORAL_TABLET | Freq: Two times a day (BID) | ORAL | Status: DC
  Administered 2013-07-30 – 2013-08-01 (×4): 1000 mg via ORAL
  Filled 2013-07-30 (×8): qty 2

## 2013-07-30 MED ORDER — FUROSEMIDE 10 MG/ML IJ SOLN
40.0000 mg | Freq: Once | INTRAMUSCULAR | Status: AC
  Administered 2013-07-30: 40 mg via INTRAVENOUS
  Filled 2013-07-30: qty 4

## 2013-07-30 MED ORDER — FUROSEMIDE 20 MG PO TABS
20.0000 mg | ORAL_TABLET | Freq: Every day | ORAL | Status: DC
  Administered 2013-07-30 – 2013-08-01 (×3): 20 mg via ORAL
  Filled 2013-07-30 (×3): qty 1

## 2013-07-30 NOTE — Progress Notes (Addendum)
Spoke with pt about diabetes and home regimen for diabetes control.  Discussed A1C results (11.1% from 07/10/2013) with him and importance of maintaining good CBG control to prevent long-term and short-term complications.  Currently patient takes Lantus 24 units QHS, Novolog 14 units with breakfast, Novolog 12 units with lunch, Novolog 12 units with supper, and Metformin 500 mg BID as an outpatient for diabetes management. He states that he does not have any issues with getting his medications.  Patient states that he checks his blood sugar frequently and recently got a new glucometer.  He states that a lot of times his fasting blood gluocse is the highest reading of the day for him.  He has been seeing Dr. Lucianne Muss for diabetes management.  However, he would like to see another endocrinologist to see if they can help him get better control of his diabetes.  Patient reports that he is planning to make an appointment with Dr. Fransico Him and I provided him with Dr. Isidoro Donning business card with contact information.  In discussing nutrition, patient reports that he drinks diet drinks but he craves sweets.  He states that he knows he has to make some modifications with his diet and make healthier food choices.  Will plan to consult dietician because patient would like more information on low sodium carb modified diet.  Patient verbalized understanding of information discussed and has no further questions at this time.  Will continue to follow as an inpatient.   Thanks Orlando Penner, RN, MSN, CCRN Diabetes Coordinator Inpatient Diabetes Program [phone]

## 2013-07-30 NOTE — Progress Notes (Signed)
Report called to Fara Chute, RN and pt transferred to room 319 via wheelchair with spouse at bedside. All personal items were taken with patient. Also made Dr. Alanda Slim aware that pt's urine is tea-colored.

## 2013-07-30 NOTE — Progress Notes (Signed)
UR chart review completed.  

## 2013-07-30 NOTE — Progress Notes (Signed)
ANTICOAGULATION CONSULT NOTE  Pharmacy Consult for Coumadin Indication: atrial fibrillation  No Known Allergies  Patient Measurements: Height: 5\' 10"  (177.8 cm) Weight: 228 lb 13.4 oz (103.8 kg) IBW/kg (Calculated) : 73  Vital Signs: Temp: 97.7 F (36.5 C) (08/25 0800) Temp src: Oral (08/25 0800) BP: 91/59 mmHg (08/25 0700) Pulse Rate: 69 (08/25 0700)  Labs:  Recent Labs  07/29/13 1158 07/29/13 1605 07/29/13 1755 07/30/13 0008 07/30/13 0457  HGB 13.8  --   --   --   --   HCT 43.8  --   --   --   --   PLT 232  --   --   --   --   LABPROT  --  27.4*  --   --  31.2*  INR  --  2.66*  --   --  3.15*  CREATININE 1.47*  --   --   --  1.51*  TROPONINI <0.30  --  <0.30 <0.30 <0.30    Estimated Creatinine Clearance: 64.3 ml/min (by C-G formula based on Cr of 1.51).   Medical History: Past Medical History  Diagnosis Date  . Unspecified essential hypertension   . Type II or unspecified type diabetes mellitus without mention of complication, not stated as uncontrolled   . 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 CAD (coronary artery disease)     hx Ant MI 2000 with stent to LAD,  stent to LAD again in 2009. last cath 2009  . S/P colonoscopy 08/26/2008    Normal via ostomy  . CHF (congestive heart failure)   . Acute myocardial infarction, unspecified site, episode of care unspecified 2000  . 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 Prostate cancer   . GERD (gastroesophageal reflux disease)   . Cardiomyopathy, ischemic     with Hx of BIV ICD St. Jude EF 23%    Medications:  Scheduled:  . amiodarone  200 mg Oral BID  . aspirin EC  81 mg Oral QHS  . atorvastatin  40 mg Oral q1800  . ceFEPime (MAXIPIME) IV  1 g Intravenous Q8H  . citalopram  10 mg Oral Daily  . digoxin  187.5 mcg Oral QODAY  . enalapril  10 mg Oral BID  . furosemide  20 mg Oral Daily  . insulin aspart  0-20 Units Subcutaneous TID WC  . insulin aspart   0-5 Units Subcutaneous QHS  . insulin aspart  10 Units Subcutaneous TID WC  . insulin glargine  24 Units Subcutaneous QHS  . isosorbide mononitrate  15 mg Oral BID  . metoprolol  200 mg Oral Daily  . pantoprazole  40 mg Oral Daily  . ranolazine  2,000 mg Oral BID  . sodium chloride  3 mL Intravenous Q12H  . spironolactone  12.5 mg Oral Daily  . vancomycin  1,500 mg Intravenous Q24H  . Warfarin - Pharmacist Dosing Inpatient   Does not apply q1800    Assessment: 57 yo M on chronic warfarin 2.5mg  daily for Afib.   INR on admission is therapeutic, but has trended above goal range today.  No bleeding noted.   Goal of Therapy:  INR 2-3   Plan:  1) Hold Coumadin today 2) Daily INR  Elson Clan 07/30/2013,9:03 AM

## 2013-07-30 NOTE — Consult Note (Signed)
The patient was seen and examined, and I agree with the assessment and plan as documented above. Pt with complex cardiovascular history, admitted with acute systolic heart failure, which appears to be due to dietary non-compliance, as he admits to eating a lot of sodium. He appears to take his medications as prescribed. He is now feeling much better. His chest xray suggestive of a pneumonic process as well, and he did have a leukocytosis and elevated neutrophils.  1. Acute systolic HF: Again, etiology appears to be dietary non-compliance. It does not appear to be ischemic in etiology. He is currently being treated with Enalapril, oral Lasix, Imdur, Digoxin, Metoprolol, and Spironolactone. He appears to be much more compensated. I would consider referral to the HF clinic. He is reportedly awaiting transplant but his markedly elevated HbA1C could preclude this. He is apparently scheduled to see his cardiologist, Dr. Alanda Amass, this Thursday. Treatment for PAF and CAD as noted above, with coronary disease being managed medically. Treatment of possible pneumonia will be deferred to the hospitalist team.

## 2013-07-30 NOTE — Consult Note (Signed)
CARDIOLOGY CONSULT NOTE  Patient ID: [name] MRN: [id number] DOB/AGE: 1955-12-16 57 y.o.  Admit date: 07/29/2013 Referring Physician: [facility] Primary PhysicianFUSCO,Abdo Denault J., MD Primary Cardiologist: [name]  Reason for Consultation:  Principal Problem:   Acute on chronic clinical systolic heart failure Active Problems:   DIABETES MELLITUS   CARDIOMYOPATHY, ISCHEMIC, with BiV ICD, st Jude EF 23%   PAF (paroxysmal atrial fibrillation)   Acute respiratory failure with hypoxia  HPI: [name] is a 57 y/o patient admitted with acute hypoxia and CHF. He has known history of ICM, EF of 20% s/p St. Jude BiV AICD pacemaker, Oct 2009, CAD with stent to LAD, recent cardiac cath in 07/16/2013 after admission for recurrent chest pain, demonstrating ischemic cardiomyopathy with a widely patent proximal LAD date and moderate segmental calcified proximal LAD stenosis. The only change in his anatomy from 5 years ago was occlusion of the high first diagonal branch which was severely diffusely diseased previously. There were no "culprit vessels.(See report below).  He also has history of PAF, on coumadin and amiodarone, with INR followed by Dr. Alanda [name], diabetes, hypertension, and  CHF.   Symptoms began 2 days ago with increased work of breathing and wheezing. The day of admission wheezing and breathing status worsened with coughing, blood tinged sputum, and abdominal distention. He admits to dietary indiscretion with salty foods, and adding salt to his foods. He is medically complaint since discharge 2 weeks ago, but has a history of medical non-compliance in the past.    CXR demonstrated worsening bilateral asymmetric edema or infiltrates. He denied chest, dizziness. Main complaint was dysppena. Pro=BNP 2,014. Na+ 133, Glucose 413, Creatinine 1.47, BUN 24. WBC elevated at 13.4. INR was elevated at 3.15.  He was treated with IV lasix, and neb tx, with O2 supplementation. He has diuresed  over 2 liters per recordings. Wt was 226 lbs on discharge 07/16/2013.    He states he is also being followed by a Dr. Pernell [name], in [facility] and is being considered for heart transplant. His BiV Pacemaker was last interrogated one month ago by Dr.Weintaub.      Review of systems complete and found to be negative unless listed above   Past Medical History  Diagnosis Date  . Unspecified essential hypertension   . Type II or unspecified type diabetes mellitus without mention of complication, not stated as uncontrolled   . 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 CAD (coronary artery disease)     hx Ant MI 2000 with stent to LAD,  stent to LAD again in 2009. last cath 2009  . S/P colonoscopy 08/26/2008    Normal via ostomy  . CHF (congestive heart failure)   . Acute myocardial infarction, unspecified site, episode of care unspecified 2000  . 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 Prostate cancer   . GERD (gastroesophageal reflux disease)   . Cardiomyopathy, ischemic     with Hx of BIV ICD St. Jude EF 23%    Family History  Problem Relation Age of Onset  . Colon cancer Mother 63  . Colon cancer Sister 84  . Coronary artery disease Father     History   Social History  . Marital Status: Married    Spouse Name: N/A    Number of Children: 1  . Years of Education: N/A   Occupational History  .     Social History Main Topics  . Smoking status:  Never Smoker   . Smokeless tobacco: Never Used  . Alcohol Use: No     Comment: former user  . Drug Use: No  . Sexual Activity: Not on file   Other Topics Concern  . Not on file   Social History Narrative  . No narrative on file    Past Surgical History  Procedure Laterality Date  . Internal defibrillator and pacemaker  2010    x2 St. Jude device  . Abdominal and perineal resection of rectum with total mesorectal excision      10/04/2007  . Colonoscopy      05/17/2007. IMPRESSION:  Semilunar, apple-core neoplasm low in the rectum (palpable on digital rectal exam) beginning at 5 cm and corkscrewing up 5 cm in length. This was a low rectal lesion consistent with colorectal carcinoma. It was biosied multiple times. The upstream colon all the way to the cecum appeared normal. Recommendations: Followup on path. Surgical Consultation   . Coronary angioplasty  2000. 2009    with stents  . Colonoscopy  09/14/2011    Procedure: COLONOSCOPY;  Surgeon: Robert Ade, MD;  Location: [facility];  Service: Endoscopy;  Laterality: N/A;  8:30- TCS via colostomy & pt has defibrillator  . Cardiac catheterization  07/13/2013     Prescriptions prior to admission  Medication Sig Dispense Refill  . amiodarone (PACERONE) 200 MG tablet Take 200 mg by mouth 2 (two) times daily.       Marland Kitchen aspirin 81 MG EC tablet Take 81 mg by mouth at bedtime.       . citalopram (CELEXA) 10 MG tablet Take 10 mg by mouth Daily.       Marland Kitchen co-enzyme Q-10 50 MG capsule Take 50 mg by mouth daily.        Marland Kitchen COUMADIN 2.5 MG tablet Take 2.5 mg by mouth every evening.       Marland Kitchen CRESTOR 20 MG tablet Take 20 mg by mouth daily.       . digoxin 187.5 MCG TABS Take 187.5 mcg by mouth every other day.  30 tablet  5  . enalapril (VASOTEC) 10 MG tablet Take 10 mg by mouth 2 (two) times daily.       . furosemide (LASIX) 20 MG tablet Take 20 mg by mouth daily.      . insulin aspart (NOVOLOG) 100 UNIT/ML injection Inject 12-14 Units into the skin 3 (three) times daily before meals. 14 units at breakfast and 12 units a lunch and 12 units at supper per sliding scale       . insulin glargine (LANTUS) 100 UNIT/ML injection Inject 24 Units into the skin at bedtime.        . isosorbide mononitrate (IMDUR) 30 MG 24 hr tablet Take 0.5 tablets (15 mg total) by mouth 2 (two) times daily.  30 tablet  8  . metFORMIN (GLUCOPHAGE) 500 MG tablet Take 1 tablet (500 mg total) by mouth 2 (two) times daily with a meal.      . metoprolol (TOPROL-XL) 200  MG 24 hr tablet Take 200 mg by mouth daily.       . niacin (NIASPAN) 1000 MG CR tablet Take 1,000 mg by mouth at bedtime.        . Omega-3 Fatty Acids (FISH OIL) 1200 MG CAPS Take 1,200 mg by mouth 2 (two) times daily.        . pantoprazole (PROTONIX) 40 MG tablet Take 40 mg by mouth Daily.       Marland Kitchen  ranolazine (RANEXA) 1000 MG SR tablet Take 2,000 mg by mouth 2 (two) times daily.      Marland Kitchen spironolactone (ALDACTONE) 25 MG tablet Take 12.5 mg by mouth daily.       . vitamin C (ASCORBIC ACID) 500 MG tablet Take 500 mg by mouth daily.        . nitroGLYCERIN (NITROLINGUAL) 0.4 MG/SPRAY spray Place 1 spray under the tongue every 5 (five) minutes as needed. angina       Cardiac Cath 07/10/2013 ANGIOGRAPHIC RESULTS:  1. Left main; normal  2. LAD; the entire proximal third of the LAD was fluoroscopically calcified. The proximal third had approximately 50-60% segmental stenosis. The stent was widely patent in the proximal third of the LAD with 30-40% in-stent restenosis". There was a moderate size first diagonal branch and the ramus distribution that was occluded in its proximal portion and filled by collaterals. This was noted to be highly diseased at his last cath in 2009. There was 50-60% segmental stenosis in the middle third and 70% in the distal/apical third. There were 2 small marginal branches arising from the middle third that had 90% ostial stenoses unchanged from prior cath  3. Left circumflex; dominant with 99% long segmental proximal OM1 stenosis in a small to medium-size vessel unchanged from prior cath  4. Right coronary artery; nondominant with 50% mid and 80% distal stenosis unchanged from prior cath  5. 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 The only change in his anatomy from 5 years ago was occlusion of the high first diagonal branch which was  severely diffusely diseased previously. There are no "culprit vessels. The patient is already on maximal medical therapy. Plans will be continued medical therapy. The sheath was removed and a TR Band was placed on the right wrist to achieve patent hemostasis. The patient left the Cath Lab in stable condition. He'll be gently hydrated and Coumadin will be restarted.   Physical Exam: Blood pressure 93/56, pulse 68, temperature 97.7 F (36.5 C), temperature source Oral, resp. rate 16, height 5\' 10"  (1.778 m), weight 228 lb 13.4 oz (103.8 kg), SpO2 96.00%.   General: Well developed, well nourished, in no acute distress Head: Eyes PERRLA, No xanthomas.   Normal cephalic and atramatic  Lungs: Clear bilaterally with some crackles in the bases, no wheezes are noted. No coughing. Heart: HRRR S1 S2,distant without MRG.  Pulses are 2+ & equal.            No carotid bruit. No JVD.   Abdomen: Bowel sounds are positive, abdomen mildly distendedt and non-tender without masses or  Hernia's noted. Msk:  Back normal, normal gait. Normal strength and tone for age. Extremities: No clubbing, cyanosis or edema.  DP +1 Neuro: Alert and oriented X 3. Psych:  Good affect, responds appropriately  Labs:   Lab Results  Component Value Date   WBC 13.4* 07/29/2013   HGB 13.8 07/29/2013   HCT 43.8 07/29/2013   MCV 93.6 07/29/2013   PLT 232 07/29/2013    Recent Labs Lab 07/30/13 0457  NA 135  K 3.7  CL 97  CO2 26  BUN 23  CREATININE 1.51*  CALCIUM 8.8  GLUCOSE 169*   Lab Results  Component Value Date   CKTOTAL 523* 09/23/2011   CKMB 2.0 09/23/2011   TROPONINI <0.30 07/30/2013    Lab Results  Component Value Date  CHOL 238* 07/11/2013   CHOL  Value: 108        ATP III CLASSIFICATION:  <200     mg/dL   Desirable  161-096  mg/dL   Borderline High  >=045    mg/dL   High        4/0/9811   CHOL  Value: 109        ATP III CLASSIFICATION:  <200     mg/dL   Desirable  914-782  mg/dL   Borderline High  >=956     mg/dL   High 21/02/864   Lab Results  Component Value Date   HDL 27* 07/11/2013   HDL 24* 01/09/2009   HDL 26* 11/12/2008   Lab Results  Component Value Date   LDLCALC 161* 07/11/2013   LDLCALC  Value: 55        Total Cholesterol/HDL:CHD Risk Coronary Heart Disease Risk Table                     Men   Women  1/2 Average Risk   3.4   3.3  Average Risk       5.0   4.4  2 X Average Risk   9.6   7.1  3 X Average Risk  23.4   11.0        Use the calculated Patient Ratio above and the CHD Risk Table to determine the patient's CHD Risk.        ATP III CLASSIFICATION (LDL):  <100     mg/dL   Optimal  784-696  mg/dL   Near or Above                    Optimal  130-159  mg/dL   Borderline  295-284  mg/dL   High  >132     mg/dL   Very High 03/09/101   LDLCALC  Value: 55        Total Cholesterol/HDL:CHD Risk Coronary Heart Disease Risk Table                     Men   Women  1/2 Average Risk   3.4   3.3 11/12/2008   Lab Results  Component Value Date   TRIG 252* 07/11/2013   TRIG 145 01/09/2009   TRIG 139 11/12/2008   Lab Results  Component Value Date   CHOLHDL 8.8 07/11/2013   CHOLHDL 4.5 01/09/2009   CHOLHDL 4.2 11/12/2008   No results found for this basename: LDLDIRECT    BNP (last 3 results)  Recent Labs  07/11/13 0500 07/13/13 0545 07/29/13 1158  PROBNP 2123.0* 3829.0* 2014.0*      Radiology: Dg Chest 2 View  07/30/2013   CLINICAL DATA:  57 year old male with shortness of breath. Abnormal chest x-ray.  EXAM: CHEST  2 VIEW  COMPARISON:  07/29/2013 and earlier.  FINDINGS: Stable lung volumes. Stable left chest cardiac AICD. Stable cardiac size and mediastinal contours. Visualized tracheal air column is within normal limits. No pneumothorax. Small bilateral pleural effusions only visible on the lateral view but do appear increased since 07/10/2013. Mild associated patchy bibasilar opacity,. Associated increased coarse and streaky perihilar opacity in the right lung. No pulmonary edema. Stable visualized  osseous structures.  IMPRESSION: 1. Trace/small pleural effusions with mild bibasilar atelectasis suspected.  2. Superimposed increased patchy/ reticulonodular opacity in the right lung, favor right lung infection.   Electronically Signed   By: Augusto Gamble   On:  07/30/2013 08:59   Dg Chest Portable 1 View  07/29/2013   *RADIOLOGY REPORT*  Clinical Data: Shortness of breath  PORTABLE CHEST - 1 VIEW  Comparison: 07/10/2013  Findings: Left subclavian AICD stable.  New moderate predominately perihilar interstitial and alveolar edema or infiltrates, with patchy areas of more focal consolidation laterally in the right upper lobe and in the left infrahilar region.  No definite effusion.  Stable cardiomegaly.  Degenerative changes of bilateral shoulders.  IMPRESSION:  1.  Worsening bilateral asymmetric edema or infiltrates. 2.  Stable cardiomegaly and postop change.   Original Report Authenticated By: D. Deanne Coffer III, MD   EKG:AV pacing rate of 71 bpm.  ASSESSMENT AND PLAN:   1.Acute on Chronic Systolic CHF in the setting of Ischemic CM:  He complained of worsening dyspnea and wheezing two days prior to admission. He admits to eating a lot of salty foods, especially tomato sandwiches with added salt. He has been medically complaint.  He has been transitioned back to PO lasix 20 mg daily. He is not on ACE inhibitor as inpatient but is on enalapril as OP, spironolactone and nitrates. He is diuresing.Discuss need to continue IV lasix for one more day before going back to po. Creatinine 1.51 likely related to cardiorenal syndrome. May need referral to CHF clinic as OP. No inotropic therapy at this time.  2. Ischemic CM: EF is 23% per recent notes with St. Judes BiV AICD. Recently checked one month ago. BP is low normal for significantly decreased EF. Optimal medical therapy.  3. PAF: Currently AV paced.Amiodarone and digoxin continue.  HR controlled. Coumadin managed by pharmacy. INR supra therapeutic on admission  with amiodarone.   4. CAD: Recent cardiac catheterization completed 07/10/2013 demonstrated widely patent LAD stent, with moderate segmental calcified proximal LAD stenosis, with occlusion of the high first diagonal branch. He was increased on dose of Ranexa to 1000 mg BID. Remains on statin, and ASA.  5. Diabetes; Blood glucose elevated on admission. Compliance with diet is a problem for him. Defer to PCP for insulin dosages. Recommend dietician to counsel on low salt and diabetic diet.    Signed: Bettey Mare. Lyman Bishop NP [facility] 07/30/2013, 9:57 AM Co-Sign MD

## 2013-07-30 NOTE — Progress Notes (Signed)
[facility] PROGRESS NOTE  [name] ZOX:[id number] DOB: 02-24-1956 DOA: 07/29/2013 PCP: Cassell Smiles., MD  Assessment/Plan: 1. Acute respiratory failure with hypoxia: Secondary to systolic heart failure. Pneumonia doubted. Followup chest x-ray. Wean oxygen as tolerated. Treat heart failure. 2. Acute on chronic systolic congestive heart failure: Precipitating factor unclear. Troponins negative. No history to suggest ACS. Modified diuretic therapy given relative hypotension. Continue chronic medications including Ranexa, Lipitor, Imdur, Vasotec, metoprolol, Aldactone, aspirin 3. Recent admission for unstable angina: Per cardiology recommendations and catheter report at that time plan is for medical management 4. Ischemic cardiomyopathy: Appears stable. Plan as above. 5. Chronic kidney disease stage III: Stable. 6. Atrial fibrillation: Warfarin per pharmacy. Continue digoxin, Pacerone, beta blocker. 7. Diabetes mellitus, uncontrolled with hyperglycemia: Better today. Continue Lantus, meal coverage, sliding scale. Hemoglobin A1c 11.1   Wean oxygen  Change to oral Lasix. Continue other cardiac medications. Followup cardiology recommendations.  Followup chest x-ray, likely narrow or discontinue antibiotics based on results  If continues to improve would anticipate discharge within 48 hours  Pending studies:   Chest x-ray  Code Status: Full code DVT prophylaxis: Warfarin Family Communication: None present Disposition Plan: As above  Brendia Sacks, MD  [facility]  Pager [phone] If 7PM-7AM, please contact night-coverage at [email or web address], password [facility] 07/30/2013, 8:19 AM  LOS: 1 day   Clinical Summary: 57 year old man with complex past medical history including ischemic cardiomyopathy with pacemaker/defibrillator in place presents the emergency department with sudden onset of shortness of breath this morning. Initial evaluation was notable for acute hypoxic  respiratory failure and chest x-ray suggested heart failure. Patient was placed on venimask and referred for admission to SDU.  Consultants:  [facility] cardiology   Procedures:    Antibiotics:  Cefepime  8/24 >>   Vancomycin 8/24 >>   HPI/Subjective: No issues overnight per patient. Breathing better. No chest pain or pain with inspiration. No nausea or vomiting. Some low blood pressures recorded overnight. No further cough.  Objective: Filed Vitals:   07/30/13 0400 07/30/13 0500 07/30/13 0600 07/30/13 0700  BP: 80/46 89/61 77/49  91/59  Pulse:   67 69  Temp: 98.7 F (37.1 C)     TempSrc: Oral     Resp: 17 20 17 16   Height:      Weight:  103.8 kg (228 lb 13.4 oz)    SpO2:   95% 97%    Intake/Output Summary (Last 24 hours) at 07/30/13 0819 Last data filed at 07/30/13 0500  Gross per 24 hour  Intake   1220 ml  Output   3775 ml  Net  -2555 ml     Filed Weights   07/29/13 1130 07/29/13 1509 07/30/13 0500  Weight: 100.245 kg (221 lb) 103.4 kg (227 lb 15.3 oz) 103.8 kg (228 lb 13.4 oz)    Exam:   Mild hypotension overnight. Afebrile, vitals otherwise stable. Stable hypoxia.  General: Appears calm and comfortable.   Psychiatric: Grossly normal mood and affect. Speech fluent and clear.  Eyes: Pupils equal, round, eyes appear grossly normal  Cardiovascular: Regular rate and rhythm. No murmur, rub, gallop. No lower extremity edema.  Respiratory: Bilateral inspiratory crackles, no wheezes or rhonchi. Normal respiratory effort.  Skin: No apparent change.  Data Reviewed:  Weight without significant change.  -2.555 L  Capillary blood sugars stable  Creatinine without significant change.  Cardiac enzymes stable.  INR 3.15.  Scheduled Meds: . amiodarone  200 mg Oral BID  . aspirin EC  81 mg Oral QHS  .  atorvastatin  40 mg Oral q1800  . ceFEPime (MAXIPIME) IV  1 g Intravenous Q8H  . citalopram  10 mg Oral Daily  . digoxin  187.5 mcg Oral QODAY  . enalapril   10 mg Oral BID  . furosemide  40 mg Intravenous Q12H  . insulin aspart  0-20 Units Subcutaneous TID WC  . insulin aspart  0-5 Units Subcutaneous QHS  . insulin aspart  10 Units Subcutaneous TID WC  . insulin glargine  24 Units Subcutaneous QHS  . isosorbide mononitrate  15 mg Oral BID  . metoprolol  200 mg Oral Daily  . pantoprazole  40 mg Oral Daily  . ranolazine  2,000 mg Oral BID  . sodium chloride  3 mL Intravenous Q12H  . spironolactone  12.5 mg Oral Daily  . vancomycin  1,500 mg Intravenous Q24H  . Warfarin - Pharmacist Dosing Inpatient   Does not apply q1800   Continuous Infusions:   Principal Problem:   Acute on chronic clinical systolic heart failure Active Problems:   DIABETES MELLITUS   CARDIOMYOPATHY, ISCHEMIC, with BiV ICD, st Jude EF 23%   PAF (paroxysmal atrial fibrillation)   Acute respiratory failure with hypoxia   Time spent 20 minutes

## 2013-07-31 DIAGNOSIS — J189 Pneumonia, unspecified organism: Secondary | ICD-10-CM

## 2013-07-31 LAB — GLUCOSE, CAPILLARY: Glucose-Capillary: 138 mg/dL — ABNORMAL HIGH (ref 70–99)

## 2013-07-31 LAB — BASIC METABOLIC PANEL
Calcium: 8.8 mg/dL (ref 8.4–10.5)
GFR calc Af Amer: 63 mL/min — ABNORMAL LOW (ref >90)
GFR calc non Af Amer: 54 mL/min — ABNORMAL LOW (ref >90)
Potassium: 3.3 mEq/L — ABNORMAL LOW (ref 3.5–5.1)
Sodium: 133 mEq/L — ABNORMAL LOW (ref 135–145)

## 2013-07-31 LAB — PROTIME-INR: INR: 3.25 — ABNORMAL HIGH (ref 0.00–1.49)

## 2013-07-31 MED ORDER — CEFUROXIME AXETIL 250 MG PO TABS
500.0000 mg | ORAL_TABLET | Freq: Two times a day (BID) | ORAL | Status: DC
  Administered 2013-07-31 – 2013-08-01 (×2): 500 mg via ORAL
  Filled 2013-07-31 (×2): qty 2

## 2013-07-31 MED ORDER — POTASSIUM CHLORIDE CRYS ER 20 MEQ PO TBCR
40.0000 meq | EXTENDED_RELEASE_TABLET | Freq: Once | ORAL | Status: AC
  Administered 2013-07-31: 40 meq via ORAL
  Filled 2013-07-31: qty 2

## 2013-07-31 NOTE — Progress Notes (Signed)
  RD consulted for nutrition education regarding diabetes and low sodium diets.   Lab Results  Component Value Date   HGBA1C 11.1* 07/10/2013    RD provided "Carbohydrate Counting for People with Diabetes" and "Low Sodium Nutrition therapy" handouts from the Academy of Nutrition and Dietetics. Discussed different food groups and their effects on blood sugar, emphasizing carbohydrate-containing foods. Provided list of carbohydrates and recommended serving sizes of common foods. Discussed importance of controlled and consistent carbohydrate intake throughout the day. Provided examples of ways to balance meals/snacks and encouraged intake of high-fiber, whole grain complex carbohydrates. Discouraged intake of processed foods and use of salt shaker. Reviewed patient's dietary recall. Provided examples on ways to decrease sodium intake in diet  Attempted using teach back method. Expect poor compliance. Pt disinterested during education, watching TV.   Body mass index is 33.31 kg/(m^2). Pt meets criteria for obesity Class I based on current BMI.  Current diet order is Heart Healthy/ CHO Modified, patient is consuming approximately 0-75% of meals at this time. Eating a plate of fresh fruit during my visit. Labs and medications reviewed. No further nutrition interventions warranted at this time. RD contact information provided.  Royann Shivers MS,RD,LDN,CSG Office: [phone] Pager: [phone]

## 2013-07-31 NOTE — Progress Notes (Signed)
SUBJECTIVE: Feeling much better.  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 Abnormal chest x-ray.  EXAM: CHEST  2 VIEW  COMPARISON:  07/29/2013 and earlier.  FINDINGS: Stable lung volumes. Stable left chest cardiac AICD. Stable cardiac size and mediastinal contours. Visualized tracheal air column is within normal limits. No pneumothorax. Small bilateral pleural effusions only visible on the lateral view but do appear increased since 07/10/2013. Mild associated patchy bibasilar opacity,. Associated increased coarse and streaky perihilar opacity in the right lung. No pulmonary edema. Stable visualized osseous structures.  IMPRESSION: 1. Trace/small pleural effusions with mild bibasilar atelectasis suspected.  2. Superimposed increased patchy/ reticulonodular opacity in the right lung, favor right lung infection.   Electronically Signed   By: Augusto Gamble   On: 07/30/2013 08:59       PHYSICAL EXAM BP 100/58  Pulse 69  Temp(Src) 98.8 F (37.1 C) (Oral)  Resp 18  Ht 5\' 10"  (1.778 m)  Wt 232 lb 2.3 oz (105.3 kg)  BMI 33.31 kg/m2  SpO2 92% General: Well developed, well nourished, in no acute distress Head: Eyes PERRLA, No xanthomas.   Normal cephalic and  atramatic  Lungs: Clear bilaterally to auscultation, no wheezes or rhonchi Heart: HRRR S1 S2, No MRG .  Pulses are 2+ & equal.            No carotid bruit. No JVD.  Abdomen: Bowel sounds are positive, abdomen soft and non-tender without masses or  Hernia's noted. Msk:  Back normal, normal gait. Normal strength and tone for age. Extremities: No clubbing, cyanosis, non-pitting edema LE.  DP +1 Neuro: Alert and oriented X 3. Psych:  Good affect, responds appropriately  TELEMETRY: Reviewed telemetry pt in AV paced, 71 bpm.  ASSESSMENT AND PLAN:  1.Acute on Chronic Systolic CHF in the setting of ICM: He continues to diurese with lasix. Now transitioned to PO lasix. Wt does not reflect diureses. He is breathing much better with no significant edema. Medical non-compliance has been his main issue, to include salty foods. Slightly hypokalemic today, has been repleated.   I have discussed with him follow up with CHF clinic in [place] as OP. He is willing to be seen there. He is due to see Dr. Alanda Amass on Thursday. Appears euvolemic at this time.Should be able to go home today with close follow up as scheduled. Continue current medication regimen. I have left a message with CHF clinic in [place] with Dr. Teressa Lower for appt to be established with them for ongoing CHF management.  2. CAD: Cath in 07/2013 showed widely patent LAD stent with moderate segmental calcified proximal LAD stenosis, with occlusion of the  high first diagonal branch.  Continue medical treatment with risk management.Amiodarone has been d/c'd.  3. PAF:  Continues on coumadin. INR is elevated. Pharmacy is managing this. He has been followed by Dr. Alanda Amass in clinic for dosing. With amiodarone discontinued, this will need to be watched closely.   4. Diabetes: Much better controlled during hospitalization. Will need to continue management at home with strict control of diet.    Bettey Mare. Lyman Bishop NP [facility] 07/31/2013, 8:46  AM  The patient was seen and examined, and I agree with the assessment and plan as documented above. Continue oral diuretics, with f/u with Dr. Alanda Amass this Thursday and HF clinic referral (which has been made). Can take an additional dose of Lasix prn for increased SOB/swelling. Dietary compliance was stressed.

## 2013-07-31 NOTE — Progress Notes (Addendum)
[facility] PROGRESS NOTE  [name] AVW:[id number] DOB: 11/01/1956 DOA: 07/29/2013 PCP: Cassell Smiles., MD  Summary: 57 year old man with complex past medical history including ischemic cardiomyopathy with pacemaker/defibrillator in place presents the emergency department with sudden onset of shortness of breath this morning. Initial evaluation was notable for acute hypoxic respiratory failure and chest x-ray suggested heart failure. Patient was placed on venimask and referred for admission to SDU.  He was treated empirically for healthcare associated pneumonia and for acute systolic heart failure. His condition rapidly improved, hypoxia resolved and he was transitioned to oral Lasix antibiotics. Discharge home 8/27 as anticipated.  Assessment/Plan: 1. Acute respiratory failure with hypoxia: Resolved. Secondary to systolic heart failure, pneumonia.  2. Acute on chronic systolic congestive heart failure: Likely precipitated by diet noncompliance. Troponins negative. No history to suggest ACS. ontinue chronic medications including Ranexa, Lipitor, Imdur, Vasotec, metoprolol, Aldactone, aspirin 3. Recent admission for unstable angina: Per cardiology recommendations and catheter report at that time plan is for medical management 4. Ischemic cardiomyopathy: Stable. Plan as above. 5. Chronic kidney disease stage III: Stable. 6. Atrial fibrillation: Stable. Warfarin per pharmacy. Continue digoxin, Pacerone, beta blocker. 7. Diabetes mellitus, uncontrolled with hyperglycemia: Now well controlled. Continue Lantus, meal coverage, sliding scale. Hemoglobin A1c 11.1   Change to oral antibiotics.  Continue oral Lasix. Continue other cardiac medications. Cardiology recommendations appreciated.   Possible discharge later today versus in the morning  Pending studies:   None   Code Status: Full code DVT prophylaxis: Warfarin Family Communication: None present Disposition Plan: As  above  Brendia Sacks, MD  [facility]  Pager [phone] If 7PM-7AM, please contact night-coverage at [email or web address], password [facility] 07/31/2013, 9:25 AM  LOS: 2 days   Consultants:  [facility] cardiology   Procedures:  None   Antibiotics:  Cefepime  8/24 >> 8/26  Vancomycin 8/24 >> 8/26  Ceftin 8/26 >>   HPI/Subjective: Continues to feel better. Breathing better. Less cough. Ambulating well.  Objective: Filed Vitals:   07/30/13 1904 07/30/13 2122 07/31/13 0420 07/31/13 0500  BP: 113/62 101/56 100/58   Pulse: 66 69 69   Temp: 98 F (36.7 C) 98.8 F (37.1 C)    TempSrc: Oral Oral    Resp: 20 19 18    Height: 5\' 10"  (1.778 m)     Weight: 105.3 kg (232 lb 2.3 oz)   105.3 kg (232 lb 2.3 oz)  SpO2: 93% 93% 92%     Intake/Output Summary (Last 24 hours) at 07/31/13 0925 Last data filed at 07/31/13 0904  Gross per 24 hour  Intake   1020 ml  Output   1045 ml  Net    -25 ml     Filed Weights   07/30/13 0500 07/30/13 1904 07/31/13 0500  Weight: 103.8 kg (228 lb 13.4 oz) 105.3 kg (232 lb 2.3 oz) 105.3 kg (232 lb 2.3 oz)    Exam:   Afebrile, vital signs stable. Hypoxia is resolved.  Cardiovascular: Regular rate and rhythm. No murmur, rub, gallop. No lower extremity edema.  Respiratory: Clear to auscultation bilaterally. No wheezes, rales, rhonchi. Normal respiratory effort.  General: Appears calm and comfortable.  Data Reviewed:  -2.530 L  Potassium 3.3. Creatinine better.  INR 3.25.  Chest x-ray showed resolution of edema. Continued opacity right lung, favor right lung infection.  Scheduled Meds: . amiodarone  200 mg Oral BID  . aspirin EC  81 mg Oral QHS  . atorvastatin  40 mg Oral q1800  . ceFEPime (MAXIPIME) IV  1 g Intravenous Q8H  . citalopram  10 mg Oral Daily  . digoxin  187.5 mcg Oral QODAY  . enalapril  10 mg Oral BID  . furosemide  20 mg Oral Daily  . insulin aspart  0-20 Units Subcutaneous TID WC  . insulin aspart  0-5 Units Subcutaneous  QHS  . insulin aspart  10 Units Subcutaneous TID WC  . insulin glargine  24 Units Subcutaneous QHS  . isosorbide mononitrate  15 mg Oral BID  . metoprolol  200 mg Oral Daily  . pantoprazole  40 mg Oral Daily  . ranolazine  1,000 mg Oral BID  . sodium chloride  3 mL Intravenous Q12H  . spironolactone  12.5 mg Oral Daily  . vancomycin  1,500 mg Intravenous Q24H  . Warfarin - Pharmacist Dosing Inpatient   Does not apply q1800   Continuous Infusions:   Principal Problem:   Acute on chronic clinical systolic heart failure Active Problems:   DIABETES MELLITUS   CARDIOMYOPATHY, ISCHEMIC, with BiV ICD, st Jude EF 23%   PAF (paroxysmal atrial fibrillation)   Acute respiratory failure with hypoxia

## 2013-07-31 NOTE — Care Management Note (Signed)
    Page 1 of 1   08/01/2013     1:31:57 PM   CARE MANAGEMENT NOTE 08/01/2013  Patient:  [name], [name]   Account Number:  [id number]  Date Initiated:  07/31/2013  Documentation initiated by:  Sharrie Rothman  Subjective/Objective Assessment:   Pt admitted from home with CHF. Pt lives with his wife and will return home at discharge. Pt is independent with ADL's.     Action/Plan:   no CM needs noted.   Anticipated DC Date:  08/01/2013   Anticipated DC Plan:  HOME/SELF CARE      DC Planning Services  CM consult      Choice offered to / List presented to:             Status of service:  Completed, signed off Medicare Important Message given?  YES (If response is "NO", the following Medicare IM given date fields will be blank) Date Medicare IM given:  08/01/2013 Date Additional Medicare IM given:    Discharge Disposition:  HOME/SELF CARE  Per UR Regulation:    If discussed at Long Length of Stay Meetings, dates discussed:    Comments:  08/01/13 Rosemary Holms RN BSN CM Pt to dc home under the care of his mom. Mom signed IM for son. Declined HH services.  07/31/13 1325 Arlyss Queen, RN BSN CM

## 2013-07-31 NOTE — Progress Notes (Signed)
ANTICOAGULATION CONSULT NOTE  Pharmacy Consult for Coumadin Indication: atrial fibrillation  No Known Allergies  Patient Measurements: Height: 5\' 10"  (177.8 cm) Weight: 232 lb 2.3 oz (105.3 kg) IBW/kg (Calculated) : 73  Vital Signs: Temp: 98.8 F (37.1 C) (08/25 2122) Temp src: Oral (08/25 2122) BP: 100/58 mmHg (08/26 0420) Pulse Rate: 69 (08/26 0420)  Labs:  Recent Labs  07/29/13 1158 07/29/13 1605 07/29/13 1755 07/30/13 0008 07/30/13 0457 07/31/13 0521  HGB 13.8  --   --   --   --   --   HCT 43.8  --   --   --   --   --   PLT 232  --   --   --   --   --   LABPROT  --  27.4*  --   --  31.2* 32.0*  INR  --  2.66*  --   --  3.15* 3.25*  CREATININE 1.47*  --   --   --  1.51* 1.39*  TROPONINI <0.30  --  <0.30 <0.30 <0.30  --    Estimated Creatinine Clearance: 70.4 ml/min (by C-G formula based on Cr of 1.39).  Medical History: Past Medical History  Diagnosis Date  . Unspecified essential hypertension   . Type II or unspecified type diabetes mellitus without mention of complication, not stated as uncontrolled   . 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 CAD (coronary artery disease)     hx Ant MI 2000 with stent to LAD,  stent to LAD again in 2009. last cath 2009  . S/P colonoscopy 08/26/2008    Normal via ostomy  . CHF (congestive heart failure)   . Acute myocardial infarction, unspecified site, episode of care unspecified 2000  . 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 Prostate cancer   . GERD (gastroesophageal reflux disease)   . Cardiomyopathy, ischemic     with Hx of BIV ICD St. Jude EF 23%   Medications:  Scheduled:  . amiodarone  200 mg Oral BID  . aspirin EC  81 mg Oral QHS  . atorvastatin  40 mg Oral q1800  . ceFEPime (MAXIPIME) IV  1 g Intravenous Q8H  . citalopram  10 mg Oral Daily  . digoxin  187.5 mcg Oral QODAY  . enalapril  10 mg Oral BID  . furosemide  20 mg Oral Daily  . insulin aspart  0-20  Units Subcutaneous TID WC  . insulin aspart  0-5 Units Subcutaneous QHS  . insulin aspart  10 Units Subcutaneous TID WC  . insulin glargine  24 Units Subcutaneous QHS  . isosorbide mononitrate  15 mg Oral BID  . metoprolol  200 mg Oral Daily  . pantoprazole  40 mg Oral Daily  . potassium chloride  40 mEq Oral Once  . ranolazine  1,000 mg Oral BID  . sodium chloride  3 mL Intravenous Q12H  . spironolactone  12.5 mg Oral Daily  . vancomycin  1,500 mg Intravenous Q24H  . Warfarin - Pharmacist Dosing Inpatient   Does not apply q1800   Assessment: 57 yo M on chronic warfarin 2.5mg  daily for Afib.   INR on admission is therapeutic, INR supratherapeutic again today..  No bleeding noted.   Goal of Therapy:  INR 2-3   Plan:  1) Hold Coumadin today 2) Daily INR  Mady Gemma 07/31/2013,8:13 AM

## 2013-08-01 LAB — BASIC METABOLIC PANEL WITH GFR
BUN: 14 mg/dL (ref 6–23)
CO2: 25 meq/L (ref 19–32)
Calcium: 9.1 mg/dL (ref 8.4–10.5)
Chloride: 100 meq/L (ref 96–112)
Creatinine, Ser: 1.22 mg/dL (ref 0.50–1.35)
GFR calc Af Amer: 74 mL/min — ABNORMAL LOW
GFR calc non Af Amer: 64 mL/min — ABNORMAL LOW
Glucose, Bld: 129 mg/dL — ABNORMAL HIGH (ref 70–99)
Potassium: 3.9 meq/L (ref 3.5–5.1)
Sodium: 135 meq/L (ref 135–145)

## 2013-08-01 LAB — PROTIME-INR
INR: 2.91 — ABNORMAL HIGH (ref 0.00–1.49)
Prothrombin Time: 29.4 s — ABNORMAL HIGH (ref 11.6–15.2)

## 2013-08-01 LAB — GLUCOSE, CAPILLARY
Glucose-Capillary: 128 mg/dL — ABNORMAL HIGH (ref 70–99)
Glucose-Capillary: 213 mg/dL — ABNORMAL HIGH (ref 70–99)

## 2013-08-01 MED ORDER — WARFARIN SODIUM 1 MG PO TABS: 1.0000 mg | ORAL_TABLET | Freq: Once | ORAL | Status: DC

## 2013-08-01 MED ORDER — CEFUROXIME AXETIL 500 MG PO TABS: 500.0000 mg | ORAL_TABLET | Freq: Two times a day (BID) | ORAL | Status: DC

## 2013-08-01 NOTE — Progress Notes (Signed)
Pt is to be discharged home today. Pt is in NAD, IV is out, all paperwork has been reviewed/discussed with patient, and there are no questions/concerns at this time. Assessment is unchanged from this morning. Pt is to be accompanied downstairs by staff and family via wheelchair.  

## 2013-08-01 NOTE — Progress Notes (Signed)
ANTICOAGULATION CONSULT NOTE  Pharmacy Consult for Coumadin Indication: atrial fibrillation  No Known Allergies  Patient Measurements: Height: 5\' 10"  (177.8 cm) Weight: 229 lb (103.874 kg) IBW/kg (Calculated) : 73  Vital Signs: Temp: 97.8 F (36.6 C) (08/27 0620) Temp src: Oral (08/27 0620) BP: 113/71 mmHg (08/27 0620) Pulse Rate: 69 (08/27 0620)  Labs:  Recent Labs  07/29/13 1158  07/29/13 1755 07/30/13 0008 07/30/13 0457 07/31/13 0521 08/01/13 0612  HGB 13.8  --   --   --   --   --   --   HCT 43.8  --   --   --   --   --   --   PLT 232  --   --   --   --   --   --   LABPROT  --   < >  --   --  31.2* 32.0* 29.4*  INR  --   < >  --   --  3.15* 3.25* 2.91*  CREATININE 1.47*  --   --   --  1.51* 1.39* 1.22  TROPONINI <0.30  --  <0.30 <0.30 <0.30  --   --   < > = values in this interval not displayed. Estimated Creatinine Clearance: 79.7 ml/min (by C-G formula based on Cr of 1.22).  Medical History: Past Medical History  Diagnosis Date  . Unspecified essential hypertension   . Type II or unspecified type diabetes mellitus without mention of complication, not stated as uncontrolled   . 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 CAD (coronary artery disease)     hx Ant MI 2000 with stent to LAD,  stent to LAD again in 2009. last cath 2009  . S/P colonoscopy 08/26/2008    Normal via ostomy  . CHF (congestive heart failure)   . Acute myocardial infarction, unspecified site, episode of care unspecified 2000  . 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 Prostate cancer   . GERD (gastroesophageal reflux disease)   . Cardiomyopathy, ischemic     with Hx of BIV ICD St. Jude EF 23%   Medications:  Scheduled:  . amiodarone  200 mg Oral BID  . aspirin EC  81 mg Oral QHS  . atorvastatin  40 mg Oral q1800  . cefUROXime  500 mg Oral BID WC  . citalopram  10 mg Oral Daily  . digoxin  187.5 mcg Oral QODAY  . enalapril  10 mg Oral  BID  . furosemide  20 mg Oral Daily  . insulin aspart  0-20 Units Subcutaneous TID WC  . insulin aspart  0-5 Units Subcutaneous QHS  . insulin aspart  10 Units Subcutaneous TID WC  . insulin glargine  24 Units Subcutaneous QHS  . isosorbide mononitrate  15 mg Oral BID  . metoprolol  200 mg Oral Daily  . pantoprazole  40 mg Oral Daily  . ranolazine  1,000 mg Oral BID  . sodium chloride  3 mL Intravenous Q12H  . spironolactone  12.5 mg Oral Daily  . Warfarin - Pharmacist Dosing Inpatient   Does not apply q1800   Assessment: 57 yo M on chronic warfarin 2.5mg  daily for Afib.   INR on admission was therapeutic, however rose to supra-therapeutic levels once admitted.  Back at upper end of goal range today after being held x2 days.   No bleeding noted.   Goal of Therapy:  INR 2-3   Plan:  1) Resume Coumadin 1mg  po x1 today 2) Daily INR 3) Per cardiology note, plan to d/c amiodarone in which case warfarin dose will likely need to be increased in 1-2 weeks.  Please order appropriate INR monitoring for follow-up after discharge.   Elson Clan 08/01/2013,9:07 AM

## 2013-08-01 NOTE — Discharge Summary (Signed)
Physician Discharge Summary  [name]:[id number] DOB: 1956/09/20 DOA: 07/29/2013  PCP: Cassell Smiles., MD  Admit date: 07/29/2013 Discharge date: 08/01/2013  Time spent: 35 minutes  Recommendations for Outpatient Follow-up:  1. Follow up with Dr. Alanda Amass tomorrow as scheduled  Discharge Diagnoses:  Principal Problem:   Acute on chronic clinical systolic heart failure Active Problems:   DIABETES MELLITUS   CARDIOMYOPATHY, ISCHEMIC, with BiV ICD, st Jude EF 23%   PAF (paroxysmal atrial fibrillation)   Acute respiratory failure with hypoxia   Discharge Condition: improved  Diet recommendation: low salt, low carb  Filed Weights   07/30/13 1904 07/31/13 0500 08/01/13 0620  Weight: 105.3 kg (232 lb 2.3 oz) 105.3 kg (232 lb 2.3 oz) 103.874 kg (229 lb)    History of present illness:  57 year old man with complex past medical history including ischemic cardiomyopathy with pacemaker/defibrillator in place presents the emergency department with sudden onset of shortness of breath this morning. Initial evaluation was notable for acute hypoxic respiratory failure and chest x-ray suggested heart failure. Patient was placed on venimask and referred for admission to SDU.  History obtained from patient, son at bedside in chart review. Discharged 8/11, admitted to [facility] for chest pain associated with shortness of breath. Cardiac enzymes are negative. Clinical impression was unstable angina and he underwent left heart catheterization which revealed "ischemic myopathy with a widely patent proximal LAD date and moderate segmental calcified proximal LAD stenosis. The only change in his anatomy from 5 years ago was occlusion of the high first diagonal branch which was severely diffusely diseased previously. There were no "culprit vessels. Ranexa was increased to 1000mg  bid."  Since that time he has done well at home. He has noted some increasing lower extremity edema over the last  several days and some slight increase in shortness of breath. He weighs himself several times per week and his weight is usually approximately 221. No dietary indiscretions noted. He went boating yesterday with his son and felt relatively well. This morning after getting up he became suddenly short of breath which worsened with exertion and was associated with frothy sputum production. He went to church but was unable to even get out of the car secondary to shortness of breath. He had no chest pain. No cough before today.  In the emergency department he is noted to be afebrile with normal respiratory rate, hemodynamically stable but significant hypoxia was noted requiring high flow oxygen. No acute laboratory abnormalities were noted in the screening workup with the exception of mild leukocytosis of 13.4.glucose elevated at 413. EKG showed a paced rhythm. Chest x-ray demonstrated bilateral asymmetric edema or infiltrates.   Hospital Course:  This patient was admitted to the hospital with shortness of breath and hypoxia. He was found to have acute on chronic systolic congestive heart failure as well as an element of pneumonia. He was started on antibiotics and diuretics. He was seen by cardiology. The patient had a favorable response to treatment. He's not been changed to oral Lasix as well as oral antibiotics. He's been afebrile. He was seen by cardiology further adjust his medications. He'll followup with his regular cardiologist tomorrow. Patient is ambulating on room air without any difficulty. He is felt safe to discharge home.  Procedures:  none  Consultations:  Cardiology  Discharge Exam: Filed Vitals:   08/01/13 0620  BP: 113/71  Pulse: 69  Temp: 97.8 F (36.6 C)  Resp:     General: NAD Cardiovascular: S1,  S2, RRR Respiratory: CTA B  Discharge Instructions  Discharge Orders   Future Appointments Provider Department Dept Phone   08/09/2013 3:20 PM [facility] [phone]   10/12/2013 3:30 PM [facility] [place] [facility] [place] [facility] [phone]   01/25/2014 2:45 PM Krista Blue [facility] [phone]   01/25/2014 3:15 PM Ladene Artist, MD [place] [facility] [phone]   Future Orders Complete By Expires   (HEART FAILURE PATIENTS) Call MD:  Anytime you have any of the following symptoms: 1) 3 pound weight gain in 24 hours or 5 pounds in 1 week 2) shortness of breath, with or without a dry hacking cough 3) swelling in the hands, feet or stomach 4) if you have to sleep on extra pillows at night in order to breathe.  As directed    Call MD for:  difficulty breathing, headache or visual disturbances  As directed    Call MD for:  temperature >100.4  As directed    Diet - low sodium heart healthy  As directed    Diet Carb Modified  As directed    Increase activity slowly  As directed        Medication List    STOP taking these medications       amiodarone 200 MG tablet  Commonly known as:  PACERONE     vitamin C 500 MG tablet  Commonly known as:  ASCORBIC ACID      TAKE these medications       aspirin 81 MG EC tablet  Take 81 mg by mouth at bedtime.     cefUROXime 500 MG tablet  Commonly known as:  CEFTIN  Take 1 tablet (500 mg total) by mouth 2 (two) times daily with a meal.     citalopram 10 MG tablet  Commonly known as:  CELEXA  Take 10 mg by mouth Daily.     co-enzyme Q-10 50 MG capsule  Take 50 mg by mouth daily.     COUMADIN 2.5 MG tablet  Generic drug:  warfarin  Take 2.5 mg by mouth every evening.     CRESTOR 20 MG tablet  Generic drug:  rosuvastatin  Take 20 mg by mouth daily.     Digoxin 187.5 MCG Tabs  Take 187.5 mcg by mouth every other day.     enalapril 10 MG tablet  Commonly known as:  VASOTEC  Take 10 mg by mouth 2 (two) times daily.     Fish Oil 1200 MG Caps  Take 1,200 mg by mouth 2  (two) times daily.     furosemide 20 MG tablet  Commonly known as:  LASIX  Take 20 mg by mouth daily.     insulin aspart 100 UNIT/ML injection  Commonly known as:  novoLOG  - Inject 12-14 Units into the skin 3 (three) times daily before meals. 14 units at breakfast and 12 units a lunch and 12 units at supper per sliding scale  -      insulin glargine 100 UNIT/ML injection  Commonly known as:  LANTUS  Inject 24 Units into the skin at bedtime.     isosorbide mononitrate 30 MG 24 hr tablet  Commonly known as:  IMDUR  Take 0.5 tablets (15 mg total) by mouth 2 (two) times daily.     metFORMIN 500 MG tablet  Commonly known as:  GLUCOPHAGE  Take 1 tablet (  500 mg total) by mouth 2 (two) times daily with a meal.     metoprolol 200 MG 24 hr tablet  Commonly known as:  TOPROL-XL  Take 200 mg by mouth daily.     niacin 1000 MG CR tablet  Commonly known as:  NIASPAN  Take 1,000 mg by mouth at bedtime.     nitroGLYCERIN 0.4 MG/SPRAY spray  Commonly known as:  NITROLINGUAL  Place 1 spray under the tongue every 5 (five) minutes as needed. angina     pantoprazole 40 MG tablet  Commonly known as:  PROTONIX  Take 40 mg by mouth Daily.     ranolazine 1000 MG SR tablet  Commonly known as:  RANEXA  Take 1,000 mg by mouth 2 (two) times daily.     spironolactone 25 MG tablet  Commonly known as:  ALDACTONE  Take 12.5 mg by mouth daily.       No Known Allergies     Follow-up Information   Follow up with Robert Rooks, MD. (tomorrow as scheduled)    Specialty:  Cardiology   Contact information:   [address] [place] [address] [phone]        The results of significant diagnostics from this hospitalization (including imaging, microbiology, ancillary and laboratory) are listed below for reference.    Significant Diagnostic Studies: Dg Chest 2 View  07/30/2013   CLINICAL DATA:  57 year old male with shortness of breath. Abnormal chest x-ray.   EXAM: CHEST  2 VIEW  COMPARISON:  07/29/2013 and earlier.  FINDINGS: Stable lung volumes. Stable left chest cardiac AICD. Stable cardiac size and mediastinal contours. Visualized tracheal air column is within normal limits. No pneumothorax. Small bilateral pleural effusions only visible on the lateral view but do appear increased since 07/10/2013. Mild associated patchy bibasilar opacity,. Associated increased coarse and streaky perihilar opacity in the right lung. No pulmonary edema. Stable visualized osseous structures.  IMPRESSION: 1. Trace/small pleural effusions with mild bibasilar atelectasis suspected.  2. Superimposed increased patchy/ reticulonodular opacity in the right lung, favor right lung infection.   Electronically Signed   By: Augusto Gamble   On: 07/30/2013 08:59   Dg Chest 2 View  07/10/2013   *RADIOLOGY REPORT*  Clinical Data: Shortness of breath, chest pain.  CHEST - 2 VIEW  Comparison: 04/11/2012  Findings: Left AICD is in place, unchanged.  Mild cardiomegaly. Mild peribronchial thickening.  No confluent airspace opacities or effusions.  No acute bony abnormality.  IMPRESSION: Mild cardiomegaly.  Bronchitic changes.   Original Report Authenticated By: Charlett Nose, M.D.   Dg Chest Portable 1 View  07/29/2013   *RADIOLOGY REPORT*  Clinical Data: Shortness of breath  PORTABLE CHEST - 1 VIEW  Comparison: 07/10/2013  Findings: Left subclavian AICD stable.  New moderate predominately perihilar interstitial and alveolar edema or infiltrates, with patchy areas of more focal consolidation laterally in the right upper lobe and in the left infrahilar region.  No definite effusion.  Stable cardiomegaly.  Degenerative changes of bilateral shoulders.  IMPRESSION:  1.  Worsening bilateral asymmetric edema or infiltrates. 2.  Stable cardiomegaly and postop change.   Original Report Authenticated By: D. Andria Rhein, MD    Microbiology: Recent Results (from the past 240 hour(s))  CULTURE, BLOOD (ROUTINE X 2)      Status: None   Collection Time    07/29/13  3:49 PM      Result Value Range Status   Specimen Description BLOOD RIGHT ANTECUBITAL  Final   Special Requests BOTTLES DRAWN AEROBIC AND ANAEROBIC 10CC   Final   Culture NO GROWTH 3 DAYS   Final   Report Status PENDING   Incomplete  CULTURE, BLOOD (ROUTINE X 2)     Status: None   Collection Time    07/29/13  3:54 PM      Result Value Range Status   Specimen Description BLOOD LEFT ANTECUBITAL   Final   Special Requests BOTTLES DRAWN AEROBIC AND ANAEROBIC 10CC   Final   Culture NO GROWTH 3 DAYS   Final   Report Status PENDING   Incomplete  MRSA PCR SCREENING     Status: None   Collection Time    07/29/13  4:02 PM      Result Value Range Status   MRSA by PCR NEGATIVE  NEGATIVE Final   Comment:            The GeneXpert MRSA Assay (FDA     approved for NASAL specimens     only), is one component of a     comprehensive MRSA colonization     surveillance program. It is not     intended to diagnose MRSA     infection nor to guide or     monitor treatment for     MRSA infections.     Labs: Basic Metabolic Panel:  Recent Labs Lab 07/29/13 1158 07/30/13 0457 07/31/13 0521 08/01/13 0612  NA 133* 135 133* 135  K 4.6 3.7 3.3* 3.9  CL 96 97 95* 100  CO2 23 26 25 25   GLUCOSE 413* 169* 135* 129*  BUN 24* 23 20 14   CREATININE 1.47* 1.51* 1.39* 1.22  CALCIUM 9.4 8.8 8.8 9.1   Liver Function Tests: No results found for this basename: AST, ALT, ALKPHOS, BILITOT, PROT, ALBUMIN,  in the last 168 hours No results found for this basename: LIPASE, AMYLASE,  in the last 168 hours No results found for this basename: AMMONIA,  in the last 168 hours CBC:  Recent Labs Lab 07/29/13 1158  WBC 13.4*  NEUTROABS 11.4*  HGB 13.8  HCT 43.8  MCV 93.6  PLT 232   Cardiac Enzymes:  Recent Labs Lab 07/29/13 1158 07/29/13 1755 07/30/13 0008 07/30/13 0457  TROPONINI <0.30 <0.30 <0.30 <0.30   BNP: BNP (last 3 results)  Recent Labs   07/11/13 0500 07/13/13 0545 07/29/13 1158  PROBNP 2123.0* 3829.0* 2014.0*   CBG:  Recent Labs Lab 07/31/13 1207 07/31/13 1703 07/31/13 2025 08/01/13 0729 08/01/13 1145  GLUCAP 138* 136* 124* 128* 213*       Signed:  Jhonatan Lomeli  [facility] 08/01/2013, 2:00 PM

## 2013-08-02 ENCOUNTER — Telehealth (HOSPITAL_COMMUNITY): Payer: Self-pay | Admitting: Cardiovascular Disease

## 2013-08-02 ENCOUNTER — Other Ambulatory Visit: Payer: Self-pay | Admitting: Cardiovascular Disease

## 2013-08-02 ENCOUNTER — Other Ambulatory Visit: Payer: Self-pay | Admitting: *Deleted

## 2013-08-02 DIAGNOSIS — I495 Sick sinus syndrome: Secondary | ICD-10-CM | POA: Diagnosis not present

## 2013-08-02 DIAGNOSIS — R5381 Other malaise: Secondary | ICD-10-CM | POA: Diagnosis not present

## 2013-08-02 DIAGNOSIS — R6889 Other general symptoms and signs: Secondary | ICD-10-CM | POA: Diagnosis not present

## 2013-08-02 DIAGNOSIS — I509 Heart failure, unspecified: Secondary | ICD-10-CM

## 2013-08-02 DIAGNOSIS — E785 Hyperlipidemia, unspecified: Secondary | ICD-10-CM | POA: Diagnosis not present

## 2013-08-02 DIAGNOSIS — Z7901 Long term (current) use of anticoagulants: Secondary | ICD-10-CM | POA: Diagnosis not present

## 2013-08-02 DIAGNOSIS — Z4502 Encounter for adjustment and management of automatic implantable cardiac defibrillator: Secondary | ICD-10-CM | POA: Diagnosis not present

## 2013-08-02 DIAGNOSIS — I2510 Atherosclerotic heart disease of native coronary artery without angina pectoris: Secondary | ICD-10-CM | POA: Diagnosis not present

## 2013-08-02 LAB — COMPREHENSIVE METABOLIC PANEL
ALT: 21 U/L (ref 0–53)
Albumin: 3.6 g/dL (ref 3.5–5.2)
CO2: 26 mEq/L (ref 19–32)
Calcium: 9.3 mg/dL (ref 8.4–10.5)
Chloride: 103 mEq/L (ref 96–112)
Glucose, Bld: 203 mg/dL — ABNORMAL HIGH (ref 70–99)
Potassium: 4.9 mEq/L (ref 3.5–5.3)
Sodium: 137 mEq/L (ref 135–145)
Total Bilirubin: 1 mg/dL (ref 0.3–1.2)
Total Protein: 6.7 g/dL (ref 6.0–8.3)

## 2013-08-02 LAB — LIPID PANEL
Cholesterol: 182 mg/dL (ref 0–200)
Triglycerides: 122 mg/dL (ref <150)

## 2013-08-03 LAB — CULTURE, BLOOD (ROUTINE X 2)

## 2013-08-03 LAB — CBC WITH DIFFERENTIAL/PLATELET
Eosinophils Absolute: 0.2 10*3/uL (ref 0.0–0.7)
Hemoglobin: 13.7 g/dL (ref 13.0–17.0)
Lymphocytes Relative: 18 % (ref 12–46)
Lymphs Abs: 1.1 10*3/uL (ref 0.7–4.0)
MCH: 29.5 pg (ref 26.0–34.0)
Monocytes Relative: 9 % (ref 3–12)
Neutrophils Relative %: 68 % (ref 43–77)
RBC: 4.65 MIL/uL (ref 4.22–5.81)
WBC: 6.2 10*3/uL (ref 4.0–10.5)

## 2013-08-03 LAB — T4, FREE: Free T4: 1.84 ng/dL — ABNORMAL HIGH (ref 0.80–1.80)

## 2013-08-09 ENCOUNTER — Ambulatory Visit (HOSPITAL_COMMUNITY)
Admit: 2013-08-09 | Discharge: 2013-08-09 | Disposition: A | Discharge status: Home / Self Care | Payer: Medicare Other | Attending: Internal Medicine | Admitting: Internal Medicine

## 2013-08-09 VITALS — BP 88/58 | HR 85 | Wt 228.2 lb

## 2013-08-09 DIAGNOSIS — Z95810 Presence of automatic (implantable) cardiac defibrillator: Secondary | ICD-10-CM | POA: Insufficient documentation

## 2013-08-09 DIAGNOSIS — K219 Gastro-esophageal reflux disease without esophagitis: Secondary | ICD-10-CM | POA: Diagnosis not present

## 2013-08-09 DIAGNOSIS — I10 Essential (primary) hypertension: Secondary | ICD-10-CM | POA: Insufficient documentation

## 2013-08-09 DIAGNOSIS — I252 Old myocardial infarction: Secondary | ICD-10-CM | POA: Insufficient documentation

## 2013-08-09 DIAGNOSIS — E119 Type 2 diabetes mellitus without complications: Secondary | ICD-10-CM | POA: Insufficient documentation

## 2013-08-09 DIAGNOSIS — Z7982 Long term (current) use of aspirin: Secondary | ICD-10-CM | POA: Diagnosis not present

## 2013-08-09 DIAGNOSIS — I2589 Other forms of chronic ischemic heart disease: Secondary | ICD-10-CM | POA: Insufficient documentation

## 2013-08-09 DIAGNOSIS — I509 Heart failure, unspecified: Secondary | ICD-10-CM | POA: Insufficient documentation

## 2013-08-09 DIAGNOSIS — Z85048 Personal history of other malignant neoplasm of rectum, rectosigmoid junction, and anus: Secondary | ICD-10-CM | POA: Diagnosis not present

## 2013-08-09 DIAGNOSIS — Z794 Long term (current) use of insulin: Secondary | ICD-10-CM | POA: Diagnosis not present

## 2013-08-09 DIAGNOSIS — Z79899 Other long term (current) drug therapy: Secondary | ICD-10-CM | POA: Diagnosis not present

## 2013-08-09 DIAGNOSIS — I5022 Chronic systolic (congestive) heart failure: Secondary | ICD-10-CM | POA: Insufficient documentation

## 2013-08-09 DIAGNOSIS — I2510 Atherosclerotic heart disease of native coronary artery without angina pectoris: Secondary | ICD-10-CM | POA: Diagnosis not present

## 2013-08-09 NOTE — Patient Instructions (Addendum)
Follow up as needed  Take an additional 20 mg of lasix if your weight is 231 pounds or greater  Do the following things EVERYDAY: 1) Weigh yourself in the morning before breakfast. Write it down and keep it in a log. 2) Take your medicines as prescribed 3) Eat low salt foods-Limit salt (sodium) to 2000 mg per day.  4) Stay as active as you can everyday 5) Limit all fluids for the day to less than 2 liters

## 2013-08-12 DIAGNOSIS — I5022 Chronic systolic (congestive) heart failure: Secondary | ICD-10-CM | POA: Insufficient documentation

## 2013-08-12 NOTE — Progress Notes (Signed)
Patient ID: [name], male   DOB: Aug 13, 1956, 57 y.o.   MRN: [id number] Cardiology: Dr Alanda Amass Dr Bertram Millard Oncologist: Dr Truett Perna Radiation Oncologist: Dr Dayton Scrape General Surgeon: Dr Derrell Lolling  EP: Dr Graciela Husbands  HPI: [name] is referred to HF clinic by Joni Reining due to 2 hospital admits in August for volume overload.     63 yoWM with hx of St Jude BiV-ICD with EF now at 23% - ICM, , and hx of stent to LAD. 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 He has has last cath 2009 . Last Nuc. 3013 with extensive scarring from prior infarct LAD and inferior with EF 23%.   Admitted twice in August for increased dyspnea related to volume overload likely due to dietary non-compliance.  Prior to Robert admit he was eating salted peanuts and he said that he was unaware of dietary recommendations for HF.  Diuresed with IV lasix. Dig level 0.4. Discharged on 20 mg lasix.   He presents as new patient today. Feels much better. Denies SOB/PND/Orthopnea. Weight at home 227-228 pounds. Followed by Dr Pernell Dupre at [facility] for possible advanced therapies. He has been followed closely by Dr Alanda Amass as wel. Lives at home with [name]. Disabled since 2000. Compliant with medications. Tries to follow low salt food diet.    Review of Systems:     Cardiac Review of Systems: {Y] = yes [ ]  = no  Chest Pain [    ]  Resting SOB [   ] Exertional SOB  [  ]  Orthopnea [  ]   Pedal Edema [   ]    Palpitations [  ] Syncope  [  ]   Presyncope [   ]  General Review of Systems: [Y] = yes [  ]=no Constitional: recent weight change [  ]; anorexia [  ]; fatigue [  Y]; nausea [  ]; night sweats [  ]; fever [  ]; or chills [  ];                                                                                                                                          Dental: poor dentition[  ]; Last Dentist visit:   Eye : blurred  vision [  ]; diplopia [   ]; vision changes [  ];  Amaurosis fugax[  ]; Resp: cough [  ];  wheezing[  ];  hemoptysis[  ]; shortness of breath[  ]; paroxysmal nocturnal dyspnea[  ]; dyspnea on exertion[  ]; or orthopnea[  ];  GI:  gallstones[  ], vomiting[  ];  dysphagia[  ]; melena[  ];  hematochezia [  ]; heartburn[  ];   Hx of  Colonoscopy[ Y ]; GU: kidney stones [  ]; hematuria[  ];  dysuria [  ];  nocturia[  ];  history of     obstruction [  ];                 Skin: rash, swelling[  ];, hair loss[  ];  peripheral edema[  ];  or itching[  ]; Musculosketetal: myalgias[  ];  joint swelling[  ];  joint erythema[  ];  joint pain[  ];  back pain[  ];  Heme/Lymph: bruising[  ];  bleeding[  ];  anemia[  ];  Neuro: TIA[  ];  headaches[  ];  stroke[  ];  vertigo[  ];  seizures[  ];   paresthesias[  ];  difficulty walking[  ];  Psych:depression[  ]; anxiety[  ];  Endocrine: diabetes[  ];  thyroid dysfunction[  ];  Immunizations: Flu [  ]; Pneumococcal[  ];  Other:    Past Medical History  Diagnosis Date  . Unspecified essential hypertension   . Type II or unspecified type diabetes mellitus without mention of complication, not stated as uncontrolled   . 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 CAD (coronary artery disease)     hx Ant MI 2000 with stent to LAD,  stent to LAD again in 2009. last cath 2009  . S/P colonoscopy 08/26/2008    Normal via ostomy  . CHF (congestive heart failure)   . Acute myocardial infarction, unspecified site, episode of care unspecified 2000  . 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 Prostate cancer   . GERD (gastroesophageal reflux disease)   . Cardiomyopathy, ischemic     with Hx of BIV ICD St. Jude EF 23%    Current Outpatient Prescriptions  Medication Sig Dispense Refill  . amiodarone (PACERONE) 200 MG tablet Take 300 mg by mouth daily.      Marland Kitchen aspirin 81 MG EC tablet Take 81 mg by mouth at bedtime.       .  cefUROXime (CEFTIN) 500 MG tablet Take 1 tablet (500 mg total) by mouth 2 (two) times daily with a meal.  6 tablet  0  . citalopram (CELEXA) 10 MG tablet Take 10 mg by mouth Daily.       Marland Kitchen co-enzyme Q-10 50 MG capsule Take 50 mg by mouth daily.        Marland Kitchen COUMADIN 2.5 MG tablet Take 2.5 mg by mouth every evening.       Marland Kitchen CRESTOR 20 MG tablet Take 20 mg by mouth daily.       . digoxin 187.5 MCG TABS Take 187.5 mcg by mouth every other day.  30 tablet  5  . enalapril (VASOTEC) 10 MG tablet Take 10 mg by mouth 2 (two) times daily.       . furosemide (LASIX) 20 MG tablet Take 20 mg by mouth daily.      . insulin aspart (NOVOLOG) 100 UNIT/ML injection Inject 12-14 Units into the skin 3 (three) times daily before meals. 14 units at breakfast and 12 units a lunch and 12 units at supper per sliding scale       . insulin glargine (LANTUS) 100 UNIT/ML injection Inject 24 Units into the skin at bedtime.        . isosorbide mononitrate (IMDUR) 30 MG 24 hr tablet Take 0.5 tablets (15 mg total) by mouth 2 (two) times daily.  30 tablet  8  . metFORMIN (GLUCOPHAGE) 500 MG tablet Take 1 tablet (500 mg  total) by mouth 2 (two) times daily with a meal.      . metoprolol (TOPROL-XL) 200 MG 24 hr tablet Take 200 mg by mouth daily.       . niacin (NIASPAN) 1000 MG CR tablet Take 1,000 mg by mouth at bedtime.        . nitroGLYCERIN (NITROLINGUAL) 0.4 MG/SPRAY spray Place 1 spray under the tongue every 5 (five) minutes as needed. angina      . Omega-3 Fatty Acids (FISH OIL) 1200 MG CAPS Take 1,200 mg by mouth 2 (two) times daily.        . pantoprazole (PROTONIX) 40 MG tablet Take 40 mg by mouth Daily.       . ranolazine (RANEXA) 1000 MG SR tablet Take 1,000 mg by mouth 2 (two) times daily.      Marland Kitchen spironolactone (ALDACTONE) 25 MG tablet Take 12.5 mg by mouth daily.        No current facility-administered medications for this encounter.     No Known Allergies  History   Social History  . Marital Status: Married     Spouse Name: N/A    Number of Children: 1  . Years of Education: N/A   Occupational History  .     Social History Main Topics  . Smoking status: Never Smoker   . Smokeless tobacco: Never Used  . Alcohol Use: No     Comment: former user  . Drug Use: No  . Sexual Activity: Not on file   Other Topics Concern  . Not on file   Social History Narrative  . No narrative on file    Family History  Problem Relation Age of Onset  . Colon cancer Mother 75  . Colon cancer Sister 59  . Coronary artery disease Father     PHYSICAL EXAM: Filed Vitals:   08/09/13 1549  BP: 88/58  Pulse: 85   General:  Well appearing. No respiratory difficulty HEENT: normal Neck: supple. no JVD. Carotids 2+ bilat; no bruits. No lymphadenopathy or thryomegaly appreciated. Cor: PMI nondisplaced. Regular rate & rhythm. No rubs, gallops or murmurs. Lungs: clear Abdomen: soft, nontender, nondistended. No hepatosplenomegaly. No bruits or masses. Good bowel sounds.LLQ colostomy Extremities: no cyanosis, clubbing, rash, edema Neuro: alert & oriented x 3, cranial nerves grossly intact. moves all 4 extremities w/o difficulty. Affect pleasant.   No results found for this or any previous visit (from the past 24 hour(s)). No results found.   ASSESSMENT & PLAN: 1. Chronic Systolic Heart Failure S/P BiVICD Overall doing well now. Volume status improved. NYHA II Continue lasix 20 mg daily. Reinforced need for daily weights and reviewed use of sliding scale diuretics.  Instructed to take an additional 20 mg lasix if Robert weight is 231  pounds or greater. Continue spironolactone 12.5 mg daiy Continue Toprol XL 200 mg daily Continue Enalapril 10 mg bid. Reinforced daily weights, low salt food choices, and limiting fluid intake to < 2 liters per day Provided with [facility] with Heart Failure and weight chart to record daily weights.   He will continue to follow up with Dr. Alanda Amass and Dr Pernell Dupre at [facility] for  possible advanced therapies.    Follow up with HF Clinic as needed  Truman Hayward 11:32 PM

## 2013-08-13 ENCOUNTER — Telehealth (HOSPITAL_COMMUNITY): Payer: Self-pay | Admitting: Cardiovascular Disease

## 2013-08-13 DIAGNOSIS — Z23 Encounter for immunization: Secondary | ICD-10-CM | POA: Diagnosis not present

## 2013-08-15 ENCOUNTER — Ambulatory Visit (HOSPITAL_COMMUNITY)
Admission: RE | Admit: 2013-08-15 | Discharge: 2013-08-15 | Disposition: A | Discharge status: Home / Self Care | Payer: Medicare Other | Source: Ambulatory Visit | Attending: Cardiovascular Disease | Admitting: Cardiovascular Disease

## 2013-08-15 ENCOUNTER — Other Ambulatory Visit (HOSPITAL_COMMUNITY): Payer: Self-pay | Admitting: Cardiovascular Disease

## 2013-08-15 DIAGNOSIS — I4510 Unspecified right bundle-branch block: Secondary | ICD-10-CM | POA: Diagnosis not present

## 2013-08-15 DIAGNOSIS — I509 Heart failure, unspecified: Secondary | ICD-10-CM | POA: Diagnosis not present

## 2013-08-15 DIAGNOSIS — Z95810 Presence of automatic (implantable) cardiac defibrillator: Secondary | ICD-10-CM | POA: Diagnosis not present

## 2013-08-15 DIAGNOSIS — I2510 Atherosclerotic heart disease of native coronary artery without angina pectoris: Secondary | ICD-10-CM

## 2013-08-15 NOTE — Progress Notes (Signed)
2D Echo Performed 08/15/2013    Jenille Laszlo, RCS  

## 2013-08-20 ENCOUNTER — Telehealth: Payer: Self-pay | Admitting: Pharmacist Clinician (PhC)/ Clinical Pharmacy Specialist

## 2013-08-20 NOTE — Telephone Encounter (Signed)
Overdue letter sent.

## 2013-08-21 IMAGING — CR DG CHEST 2V
2 series · 2 of 2 positions shown · non-contrast
Comparison: 12/24/2001.

CLINICAL DATA: Short of breath.  Cough.  Fatigue.  Fever.

CHEST - 2 VIEW

[w chest pa]
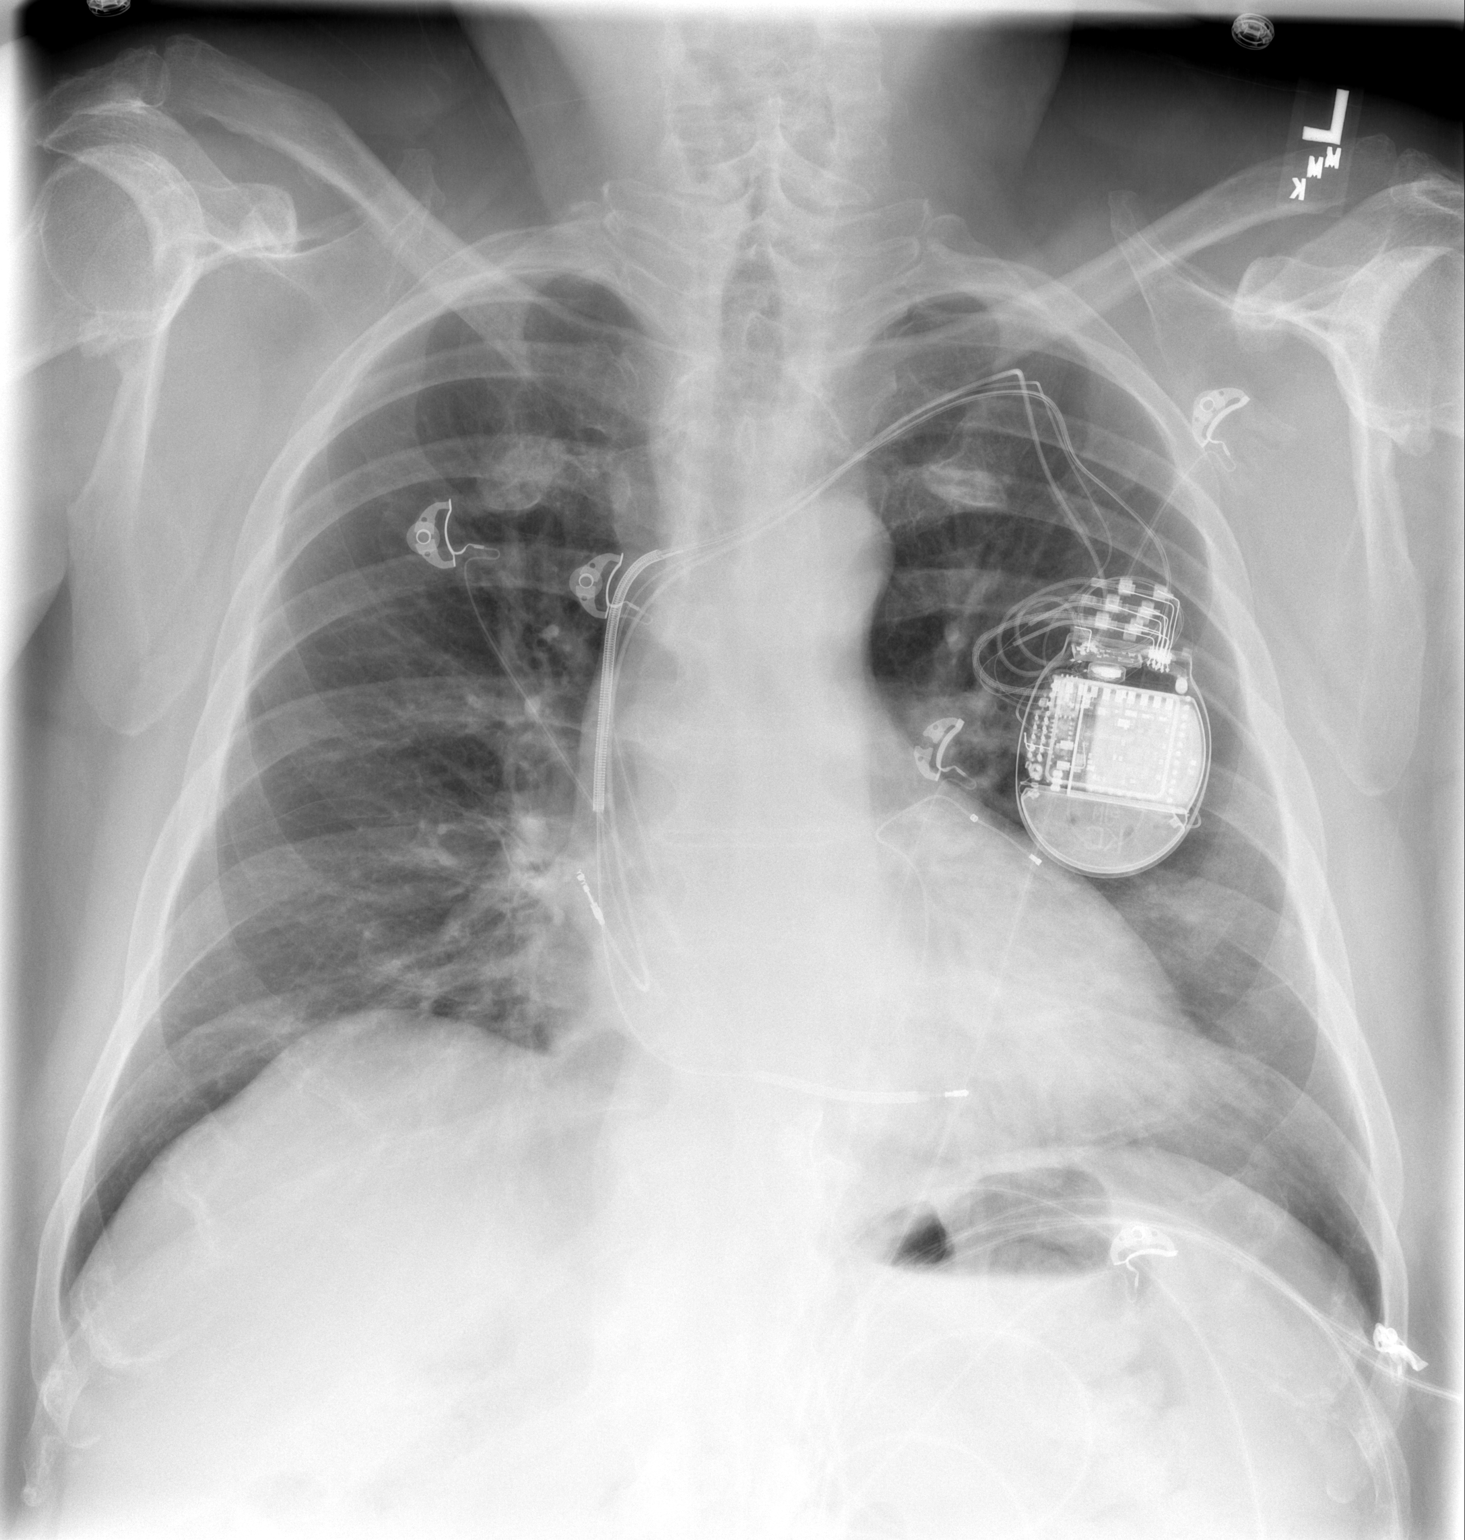

[w chest lat]
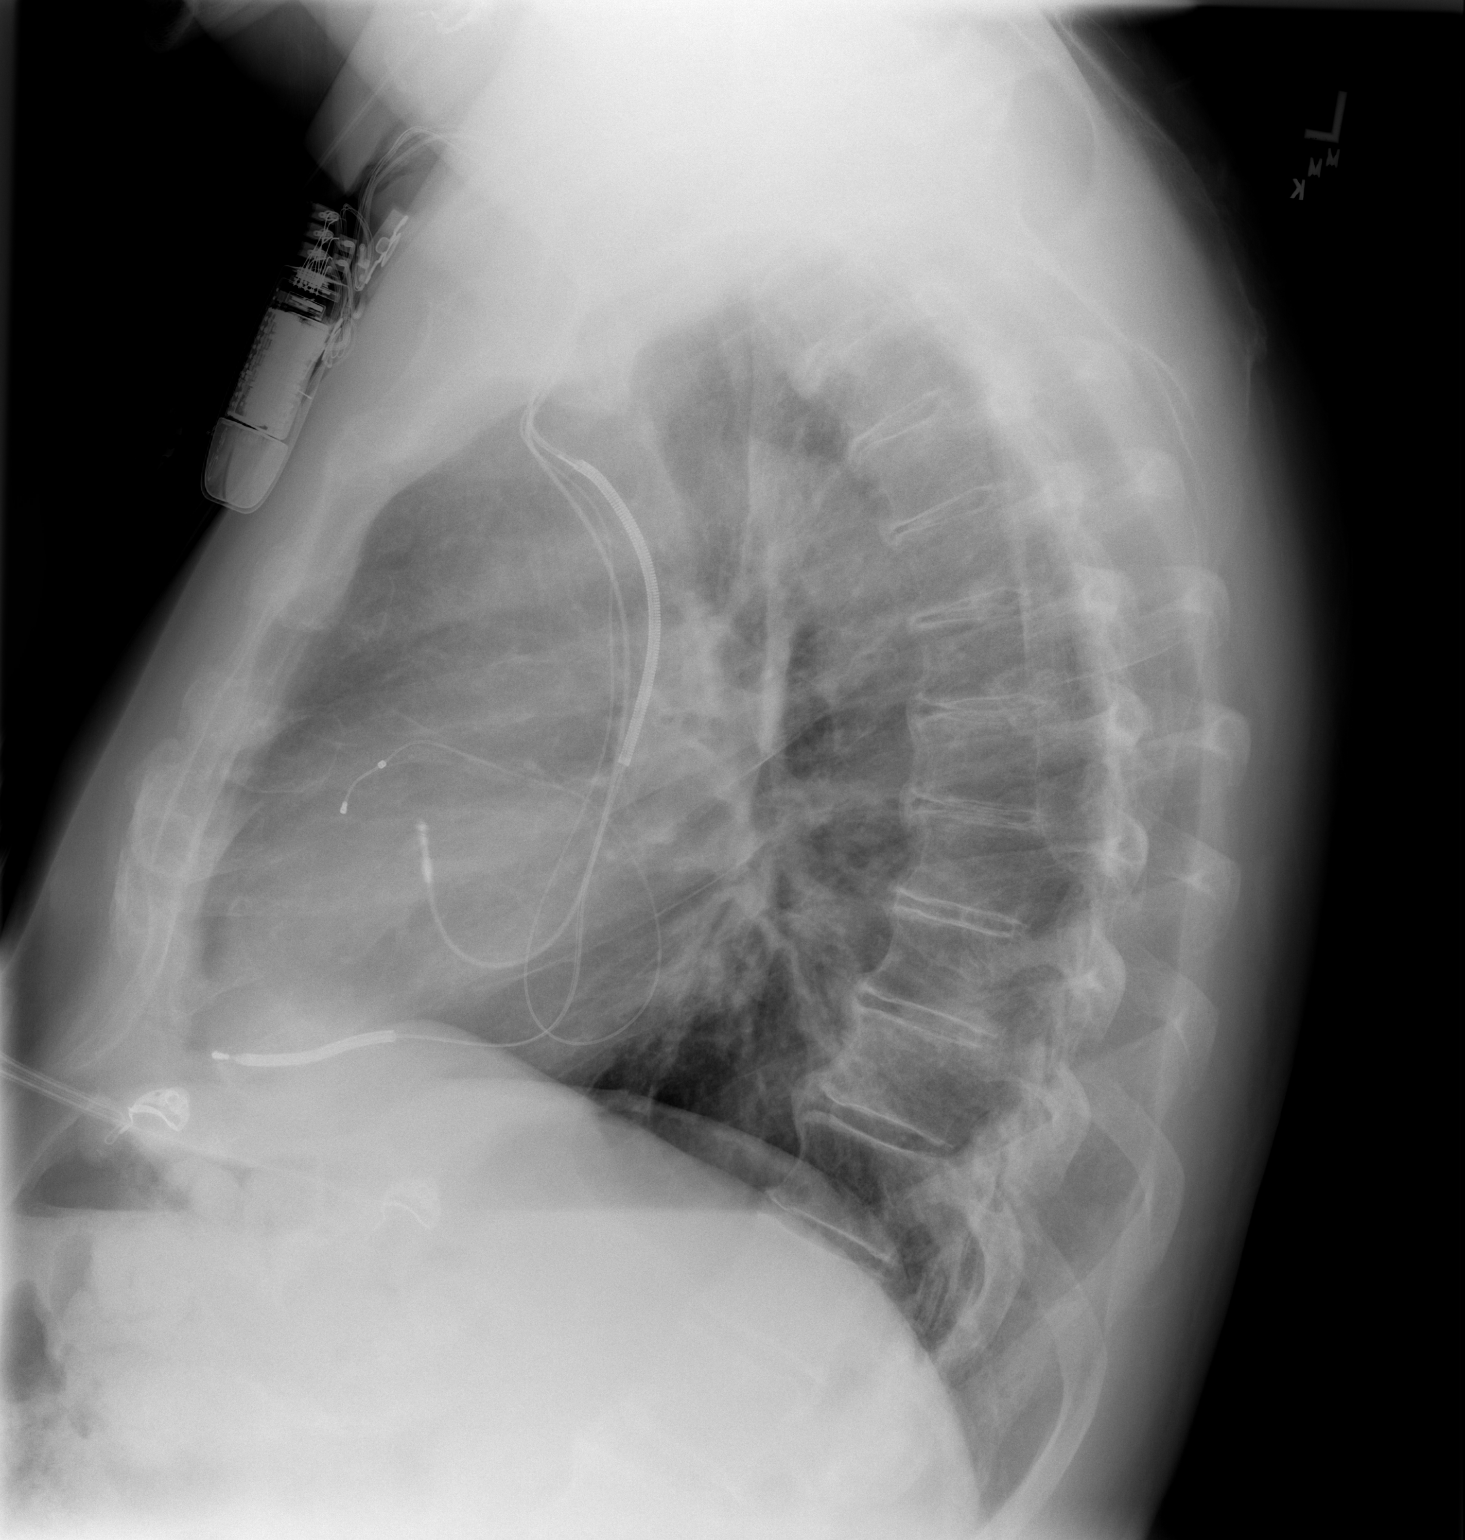

[2 of 2 positions shown; findings below may reference images not displayed]

FINDINGS: AICD appears unchanged.  Cardiopericardial silhouette and
mediastinal contours are also unchanged.  Mild right basilar
atelectasis.  No airspace disease.  Resolution of previously seen
interstitial pulmonary edema.  No effusion. Mildly exaggerated
thoracic kyphosis.
IMPRESSION: No active cardiopulmonary disease.

## 2013-08-28 ENCOUNTER — Encounter: Payer: Self-pay | Admitting: Cardiovascular Disease

## 2013-08-30 DIAGNOSIS — E119 Type 2 diabetes mellitus without complications: Secondary | ICD-10-CM | POA: Diagnosis not present

## 2013-08-30 DIAGNOSIS — I2589 Other forms of chronic ischemic heart disease: Secondary | ICD-10-CM | POA: Diagnosis not present

## 2013-08-30 DIAGNOSIS — E669 Obesity, unspecified: Secondary | ICD-10-CM | POA: Diagnosis not present

## 2013-08-30 DIAGNOSIS — I4891 Unspecified atrial fibrillation: Secondary | ICD-10-CM | POA: Diagnosis not present

## 2013-08-30 DIAGNOSIS — Z79899 Other long term (current) drug therapy: Secondary | ICD-10-CM | POA: Diagnosis not present

## 2013-08-30 DIAGNOSIS — Z95810 Presence of automatic (implantable) cardiac defibrillator: Secondary | ICD-10-CM | POA: Diagnosis not present

## 2013-08-30 DIAGNOSIS — I509 Heart failure, unspecified: Secondary | ICD-10-CM | POA: Diagnosis not present

## 2013-08-30 DIAGNOSIS — I2510 Atherosclerotic heart disease of native coronary artery without angina pectoris: Secondary | ICD-10-CM | POA: Diagnosis not present

## 2013-08-30 DIAGNOSIS — I5022 Chronic systolic (congestive) heart failure: Secondary | ICD-10-CM | POA: Diagnosis not present

## 2013-08-30 DIAGNOSIS — I951 Orthostatic hypotension: Secondary | ICD-10-CM | POA: Diagnosis not present

## 2013-08-30 DIAGNOSIS — R0609 Other forms of dyspnea: Secondary | ICD-10-CM | POA: Diagnosis not present

## 2013-08-30 DIAGNOSIS — E785 Hyperlipidemia, unspecified: Secondary | ICD-10-CM | POA: Diagnosis not present

## 2013-08-30 DIAGNOSIS — Z85038 Personal history of other malignant neoplasm of large intestine: Secondary | ICD-10-CM | POA: Diagnosis not present

## 2013-08-30 DIAGNOSIS — I10 Essential (primary) hypertension: Secondary | ICD-10-CM | POA: Diagnosis not present

## 2013-09-20 ENCOUNTER — Encounter: Payer: Self-pay | Admitting: Cardiovascular Disease

## 2013-10-02 ENCOUNTER — Other Ambulatory Visit: Payer: Self-pay | Admitting: *Deleted

## 2013-10-02 MED ORDER — FUROSEMIDE 20 MG PO TABS: 20.0000 mg | ORAL_TABLET | Freq: Every day | ORAL | Status: DC

## 2013-10-02 NOTE — Telephone Encounter (Signed)
Rx was sent to pharmacy electronically. 

## 2013-10-09 ENCOUNTER — Telehealth: Payer: Self-pay | Admitting: Internal Medicine

## 2013-10-09 ENCOUNTER — Encounter: Payer: Self-pay | Admitting: Internal Medicine

## 2013-10-09 NOTE — Telephone Encounter (Signed)
10-09-13 sent letter to set up [place] appt/mt

## 2013-10-11 ENCOUNTER — Other Ambulatory Visit: Payer: Self-pay

## 2013-10-12 ENCOUNTER — Other Ambulatory Visit (HOSPITAL_BASED_OUTPATIENT_CLINIC_OR_DEPARTMENT_OTHER): Payer: Medicare Other | Admitting: Lab

## 2013-10-12 DIAGNOSIS — C20 Malignant neoplasm of rectum: Secondary | ICD-10-CM | POA: Diagnosis not present

## 2013-10-17 ENCOUNTER — Telehealth: Payer: Self-pay | Admitting: *Deleted

## 2013-10-17 NOTE — Telephone Encounter (Signed)
Message copied by Raphael Gibney on Wed Oct 17, 2013  1:47 PM ------      Message from: [place], [place] P      Created: Wed Oct 17, 2013  1:26 PM                   ----- Message -----         From: Ladene Artist, MD         Sent: 10/15/2013   7:48 PM           To: Wandalee Ferdinand, RN, Glori Luis, RN, #            Please call patientcea is normal, f/u as scheduled ------

## 2013-10-17 NOTE — Telephone Encounter (Signed)
Left message for patient to call back regarding lab results.

## 2013-10-18 NOTE — Telephone Encounter (Signed)
Notified patient that CEA is normal at 1.5

## 2013-10-19 ENCOUNTER — Telehealth: Payer: Self-pay | Admitting: Pharmacist Clinician (PhC)/ Clinical Pharmacy Specialist

## 2013-10-19 NOTE — Telephone Encounter (Signed)
LMOM for patient to call back - have lab request for PT/INR, not sure who follows.

## 2013-10-23 ENCOUNTER — Encounter (HOSPITAL_COMMUNITY): Payer: Self-pay | Admitting: Emergency Medicine

## 2013-10-23 ENCOUNTER — Inpatient Hospital Stay (HOSPITAL_COMMUNITY)
Admission: EM | Admit: 2013-10-23 | Discharge: 2013-10-25 | DRG: 292 | Disposition: A | Discharge status: Home / Self Care | Payer: Medicare Other | Attending: Internal Medicine | Admitting: Internal Medicine

## 2013-10-23 ENCOUNTER — Emergency Department (HOSPITAL_COMMUNITY): Payer: Medicare Other

## 2013-10-23 ENCOUNTER — Other Ambulatory Visit: Payer: Self-pay

## 2013-10-23 DIAGNOSIS — I2589 Other forms of chronic ischemic heart disease: Secondary | ICD-10-CM | POA: Diagnosis present

## 2013-10-23 DIAGNOSIS — Z85048 Personal history of other malignant neoplasm of rectum, rectosigmoid junction, and anus: Secondary | ICD-10-CM | POA: Diagnosis not present

## 2013-10-23 DIAGNOSIS — I10 Essential (primary) hypertension: Secondary | ICD-10-CM

## 2013-10-23 DIAGNOSIS — I4891 Unspecified atrial fibrillation: Secondary | ICD-10-CM | POA: Diagnosis present

## 2013-10-23 DIAGNOSIS — I200 Unstable angina: Secondary | ICD-10-CM | POA: Diagnosis not present

## 2013-10-23 DIAGNOSIS — Z7901 Long term (current) use of anticoagulants: Secondary | ICD-10-CM

## 2013-10-23 DIAGNOSIS — I255 Ischemic cardiomyopathy: Secondary | ICD-10-CM | POA: Diagnosis present

## 2013-10-23 DIAGNOSIS — Z8546 Personal history of malignant neoplasm of prostate: Secondary | ICD-10-CM | POA: Diagnosis not present

## 2013-10-23 DIAGNOSIS — K219 Gastro-esophageal reflux disease without esophagitis: Secondary | ICD-10-CM | POA: Diagnosis present

## 2013-10-23 DIAGNOSIS — E119 Type 2 diabetes mellitus without complications: Secondary | ICD-10-CM

## 2013-10-23 DIAGNOSIS — N182 Chronic kidney disease, stage 2 (mild): Secondary | ICD-10-CM | POA: Diagnosis present

## 2013-10-23 DIAGNOSIS — R079 Chest pain, unspecified: Secondary | ICD-10-CM | POA: Diagnosis not present

## 2013-10-23 DIAGNOSIS — I4892 Unspecified atrial flutter: Secondary | ICD-10-CM | POA: Diagnosis not present

## 2013-10-23 DIAGNOSIS — N289 Disorder of kidney and ureter, unspecified: Secondary | ICD-10-CM

## 2013-10-23 DIAGNOSIS — I5023 Acute on chronic systolic (congestive) heart failure: Secondary | ICD-10-CM | POA: Diagnosis not present

## 2013-10-23 DIAGNOSIS — Z794 Long term (current) use of insulin: Secondary | ICD-10-CM

## 2013-10-23 DIAGNOSIS — I482 Chronic atrial fibrillation, unspecified: Secondary | ICD-10-CM | POA: Diagnosis present

## 2013-10-23 DIAGNOSIS — I5022 Chronic systolic (congestive) heart failure: Secondary | ICD-10-CM | POA: Diagnosis not present

## 2013-10-23 DIAGNOSIS — Z9861 Coronary angioplasty status: Secondary | ICD-10-CM | POA: Diagnosis not present

## 2013-10-23 DIAGNOSIS — J9601 Acute respiratory failure with hypoxia: Secondary | ICD-10-CM

## 2013-10-23 DIAGNOSIS — J9600 Acute respiratory failure, unspecified whether with hypoxia or hypercapnia: Secondary | ICD-10-CM | POA: Diagnosis not present

## 2013-10-23 DIAGNOSIS — I129 Hypertensive chronic kidney disease with stage 1 through stage 4 chronic kidney disease, or unspecified chronic kidney disease: Secondary | ICD-10-CM | POA: Diagnosis present

## 2013-10-23 DIAGNOSIS — Z95810 Presence of automatic (implantable) cardiac defibrillator: Secondary | ICD-10-CM

## 2013-10-23 DIAGNOSIS — R0602 Shortness of breath: Secondary | ICD-10-CM | POA: Diagnosis not present

## 2013-10-23 DIAGNOSIS — I509 Heart failure, unspecified: Secondary | ICD-10-CM | POA: Diagnosis not present

## 2013-10-23 DIAGNOSIS — R0789 Other chest pain: Secondary | ICD-10-CM | POA: Diagnosis not present

## 2013-10-23 DIAGNOSIS — Z7982 Long term (current) use of aspirin: Secondary | ICD-10-CM

## 2013-10-23 DIAGNOSIS — I2510 Atherosclerotic heart disease of native coronary artery without angina pectoris: Secondary | ICD-10-CM | POA: Diagnosis not present

## 2013-10-23 DIAGNOSIS — Z800 Family history of malignant neoplasm of digestive organs: Secondary | ICD-10-CM | POA: Diagnosis not present

## 2013-10-23 DIAGNOSIS — I252 Old myocardial infarction: Secondary | ICD-10-CM | POA: Diagnosis not present

## 2013-10-23 DIAGNOSIS — I480 Paroxysmal atrial fibrillation: Secondary | ICD-10-CM

## 2013-10-23 DIAGNOSIS — Z8249 Family history of ischemic heart disease and other diseases of the circulatory system: Secondary | ICD-10-CM | POA: Diagnosis not present

## 2013-10-23 HISTORY — DX: Chronic systolic (congestive) heart failure: I50.22

## 2013-10-23 HISTORY — DX: Malignant neoplasm of rectum: C20

## 2013-10-23 HISTORY — DX: Essential (primary) hypertension: I10

## 2013-10-23 HISTORY — DX: Paroxysmal atrial fibrillation: I48.0

## 2013-10-23 LAB — CBC WITH DIFFERENTIAL/PLATELET
Basophils Relative: 0 % (ref 0–1)
HCT: 40.8 % (ref 39.0–52.0)
Hemoglobin: 13.4 g/dL (ref 13.0–17.0)
Lymphocytes Relative: 8 % — ABNORMAL LOW (ref 12–46)
Lymphs Abs: 0.9 10*3/uL (ref 0.7–4.0)
MCHC: 32.8 g/dL (ref 30.0–36.0)
Monocytes Absolute: 0.7 10*3/uL (ref 0.1–1.0)
Monocytes Relative: 6 % (ref 3–12)
Neutro Abs: 8.9 10*3/uL — ABNORMAL HIGH (ref 1.7–7.7)
Neutrophils Relative %: 84 % — ABNORMAL HIGH (ref 43–77)
RBC: 4.54 MIL/uL (ref 4.22–5.81)
WBC: 10.6 10*3/uL — ABNORMAL HIGH (ref 4.0–10.5)

## 2013-10-23 LAB — BASIC METABOLIC PANEL WITH GFR
BUN: 26 mg/dL — ABNORMAL HIGH (ref 6–23)
CO2: 24 meq/L (ref 19–32)
Calcium: 9.8 mg/dL (ref 8.4–10.5)
Chloride: 96 meq/L (ref 96–112)
Creatinine, Ser: 1.5 mg/dL — ABNORMAL HIGH (ref 0.50–1.35)
GFR calc Af Amer: 57 mL/min — ABNORMAL LOW
GFR calc non Af Amer: 49 mL/min — ABNORMAL LOW
Glucose, Bld: 321 mg/dL — ABNORMAL HIGH (ref 70–99)
Potassium: 4.1 meq/L (ref 3.5–5.1)
Sodium: 135 meq/L (ref 135–145)

## 2013-10-23 LAB — DIGOXIN LEVEL: Digoxin Level: 0.9 ng/mL (ref 0.8–2.0)

## 2013-10-23 LAB — PRO B NATRIURETIC PEPTIDE: Pro B Natriuretic peptide (BNP): 5419 pg/mL — ABNORMAL HIGH (ref 0–125)

## 2013-10-23 LAB — TROPONIN I
Troponin I: <0.3 ng/mL (ref <0.30)
Troponin I: <0.3 ng/mL
Troponin I: <0.3 ng/mL (ref <0.30)

## 2013-10-23 LAB — APTT: aPTT: 38 s — ABNORMAL HIGH (ref 24–37)

## 2013-10-23 MED ORDER — INSULIN ASPART 100 UNIT/ML ~~LOC~~ SOLN
14.0000 [IU] | Freq: Every day | SUBCUTANEOUS | Status: DC
  Administered 2013-10-25: 14 [IU] via SUBCUTANEOUS

## 2013-10-23 MED ORDER — NIACIN ER 250 MG PO CPCR
ORAL_CAPSULE | ORAL | Status: AC
  Filled 2013-10-23: qty 4

## 2013-10-23 MED ORDER — SODIUM CHLORIDE 0.9 % IV SOLN: 250.0000 mL | INTRAVENOUS | Status: DC | PRN

## 2013-10-23 MED ORDER — SPIRONOLACTONE 25 MG PO TABS
25.0000 mg | ORAL_TABLET | Freq: Every morning | ORAL | Status: DC
  Administered 2013-10-24 – 2013-10-25 (×2): 25 mg via ORAL
  Filled 2013-10-23 (×2): qty 1

## 2013-10-23 MED ORDER — ONDANSETRON HCL 4 MG/2ML IJ SOLN
4.0000 mg | Freq: Four times a day (QID) | INTRAMUSCULAR | Status: DC | PRN
Start: 2013-10-23 — End: 2013-10-25

## 2013-10-23 MED ORDER — LORAZEPAM 2 MG/ML IJ SOLN
1.0000 mg | Freq: Four times a day (QID) | INTRAMUSCULAR | Status: DC | PRN
  Administered 2013-10-24: 1 mg via INTRAVENOUS
  Filled 2013-10-23: qty 1

## 2013-10-23 MED ORDER — DIGOXIN 125 MCG PO TABS
0.1875 mg | ORAL_TABLET | ORAL | Status: DC
  Administered 2013-10-25: 0.1875 mg via ORAL
  Filled 2013-10-23: qty 2

## 2013-10-23 MED ORDER — PANTOPRAZOLE SODIUM 40 MG PO TBEC
40.0000 mg | DELAYED_RELEASE_TABLET | Freq: Every day | ORAL | Status: DC
  Administered 2013-10-24 – 2013-10-25 (×2): 40 mg via ORAL
  Filled 2013-10-23 (×2): qty 1

## 2013-10-23 MED ORDER — WARFARIN SODIUM 2.5 MG PO TABS
2.5000 mg | ORAL_TABLET | Freq: Every evening | ORAL | Status: DC
  Administered 2013-10-24 (×2): 2.5 mg via ORAL
  Filled 2013-10-23 (×2): qty 1

## 2013-10-23 MED ORDER — ATORVASTATIN CALCIUM 40 MG PO TABS
40.0000 mg | ORAL_TABLET | Freq: Every day | ORAL | Status: DC
  Administered 2013-10-24 (×2): 40 mg via ORAL
  Filled 2013-10-23: qty 1

## 2013-10-23 MED ORDER — SODIUM CHLORIDE 0.9 % IJ SOLN: 3.0000 mL | INTRAMUSCULAR | Status: DC | PRN

## 2013-10-23 MED ORDER — CITALOPRAM HYDROBROMIDE 20 MG PO TABS
10.0000 mg | ORAL_TABLET | Freq: Every morning | ORAL | Status: DC
  Administered 2013-10-24 – 2013-10-25 (×2): 10 mg via ORAL
  Filled 2013-10-23 (×2): qty 1

## 2013-10-23 MED ORDER — NIACIN ER (ANTIHYPERLIPIDEMIC) 500 MG PO TBCR
1000.0000 mg | EXTENDED_RELEASE_TABLET | Freq: Every day | ORAL | Status: DC
  Administered 2013-10-24 (×2): 1000 mg via ORAL
  Filled 2013-10-23 (×3): qty 2

## 2013-10-23 MED ORDER — INSULIN GLARGINE 100 UNIT/ML ~~LOC~~ SOLN
24.0000 [IU] | Freq: Every day | SUBCUTANEOUS | Status: DC
  Administered 2013-10-24 (×2): 24 [IU] via SUBCUTANEOUS
  Filled 2013-10-23 (×3): qty 0.24

## 2013-10-23 MED ORDER — FUROSEMIDE 10 MG/ML IJ SOLN
40.0000 mg | Freq: Once | INTRAMUSCULAR | Status: AC
  Administered 2013-10-23: 40 mg via INTRAVENOUS
  Filled 2013-10-23: qty 4

## 2013-10-23 MED ORDER — METOPROLOL TARTRATE 1 MG/ML IV SOLN
5.0000 mg | Freq: Once | INTRAVENOUS | Status: AC
  Administered 2013-10-23: 5 mg via INTRAVENOUS
  Filled 2013-10-23: qty 5

## 2013-10-23 MED ORDER — WARFARIN - PHYSICIAN DOSING INPATIENT: Freq: Every day | Status: DC

## 2013-10-23 MED ORDER — RANOLAZINE ER 500 MG PO TB12
ORAL_TABLET | ORAL | Status: AC
  Filled 2013-10-23: qty 2

## 2013-10-23 MED ORDER — AMIODARONE HCL 200 MG PO TABS
200.0000 mg | ORAL_TABLET | Freq: Every day | ORAL | Status: DC
  Administered 2013-10-24: 200 mg via ORAL
  Filled 2013-10-23: qty 1

## 2013-10-23 MED ORDER — INSULIN ASPART 100 UNIT/ML ~~LOC~~ SOLN
12.0000 [IU] | Freq: Two times a day (BID) | SUBCUTANEOUS | Status: DC
  Administered 2013-10-24 (×2): 12 [IU] via SUBCUTANEOUS

## 2013-10-23 MED ORDER — NITROGLYCERIN 2 % TD OINT
0.5000 [in_us] | TOPICAL_OINTMENT | Freq: Four times a day (QID) | TRANSDERMAL | Status: DC
  Administered 2013-10-24 (×5): 0.5 [in_us] via TOPICAL
  Filled 2013-10-23 (×4): qty 1

## 2013-10-23 MED ORDER — RANOLAZINE ER 500 MG PO TB12
1000.0000 mg | ORAL_TABLET | Freq: Two times a day (BID) | ORAL | Status: DC
  Administered 2013-10-24 – 2013-10-25 (×4): 1000 mg via ORAL
  Filled 2013-10-23 (×6): qty 2

## 2013-10-23 MED ORDER — AMIODARONE HCL 200 MG PO TABS
100.0000 mg | ORAL_TABLET | Freq: Every day | ORAL | Status: DC
  Administered 2013-10-24: 100 mg via ORAL
  Filled 2013-10-23: qty 1

## 2013-10-23 MED ORDER — METOPROLOL SUCCINATE ER 50 MG PO TB24
200.0000 mg | ORAL_TABLET | Freq: Every morning | ORAL | Status: DC
  Administered 2013-10-24 – 2013-10-25 (×2): 200 mg via ORAL
  Filled 2013-10-23 (×2): qty 4

## 2013-10-23 MED ORDER — ISOSORBIDE MONONITRATE ER 30 MG PO TB24
15.0000 mg | ORAL_TABLET | Freq: Two times a day (BID) | ORAL | Status: DC
  Administered 2013-10-24 (×2): 15 mg via ORAL
  Filled 2013-10-23 (×2): qty 1

## 2013-10-23 MED ORDER — ENALAPRIL MALEATE 5 MG PO TABS
10.0000 mg | ORAL_TABLET | Freq: Two times a day (BID) | ORAL | Status: DC
  Administered 2013-10-24 – 2013-10-25 (×3): 10 mg via ORAL
  Filled 2013-10-23 (×3): qty 2

## 2013-10-23 MED ORDER — ASPIRIN EC 81 MG PO TBEC
81.0000 mg | DELAYED_RELEASE_TABLET | Freq: Every day | ORAL | Status: DC
  Administered 2013-10-24 (×2): 81 mg via ORAL
  Filled 2013-10-23 (×2): qty 1

## 2013-10-23 MED ORDER — ACETAMINOPHEN 325 MG PO TABS: 650.0000 mg | ORAL_TABLET | ORAL | Status: DC | PRN

## 2013-10-23 MED ORDER — FUROSEMIDE 10 MG/ML IJ SOLN
40.0000 mg | Freq: Two times a day (BID) | INTRAMUSCULAR | Status: DC
  Administered 2013-10-24 (×2): 40 mg via INTRAVENOUS
  Filled 2013-10-23 (×3): qty 4

## 2013-10-23 MED ORDER — SODIUM CHLORIDE 0.9 % IJ SOLN
3.0000 mL | Freq: Two times a day (BID) | INTRAMUSCULAR | Status: DC
  Administered 2013-10-24 (×2): 3 mL via INTRAVENOUS

## 2013-10-23 NOTE — ED Notes (Signed)
Complain of chest pain that started around lunch today. States the pain feels like bad indigestion. States he took a ntg spray around 1500 with some relief for a while

## 2013-10-23 NOTE — ED Notes (Signed)
Pt states he has had a couple of episodes where he had difficulty catching his breath, but pt was never in distress. At the lowest O2 was 93% on RA briefly. O2 applied at 2L.

## 2013-10-23 NOTE — H&P (Signed)
PCP:   Cassell Smiles., MD   Chief Complaint:  sob  HPI: 57 yo male with severe systolic chf with ef 20%, aicd, icm, ostomy due to colon cancer, comes in with several days of worsening sob.  He usually takes his ntg spray at home when he gets sob which usually helps.  But today it didn't help as much, and he started having sscp.  Lasted less than 30 minutes.  No le edema or swelling.  No fevers.  No n/v.  Compliant with meds.  Not on home oxygen.  He has been figthign a cold in the last week with a lot of nasal congestion which is improving.  He is back to his baseline resp status with iv lasix in ED, he has urinated approx 2 ltiers already.  Review of Systems:  Positive and negative as per HPI otherwise all other systems are negative  Past Medical History: Past Medical History  Diagnosis Date  . Unspecified essential hypertension   . Type II or unspecified type diabetes mellitus without mention of complication, not stated as uncontrolled   . 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 CAD (coronary artery disease)     hx Ant MI 2000 with stent to LAD,  stent to LAD again in 2009. last cath 2009  . S/P colonoscopy 08/26/2008    Normal via ostomy  . CHF (congestive heart failure)   . Acute myocardial infarction, unspecified site, episode of care unspecified 2000  . 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 Prostate cancer   . GERD (gastroesophageal reflux disease)   . Cardiomyopathy, ischemic     with Hx of BIV ICD St. Jude EF 23%   Past Surgical History  Procedure Laterality Date  . Internal defibrillator and pacemaker  2010    x2 St. Jude device  . Abdominal and perineal resection of rectum with total mesorectal excision      10/04/2007  . Colonoscopy      05/17/2007. IMPRESSION: Semilunar, apple-core neoplasm low in the rectum (palpable on digital rectal exam) beginning at 5 cm and corkscrewing up 5 cm in length. This was a low  rectal lesion consistent with colorectal carcinoma. It was biosied multiple times. The upstream colon all the way to the cecum appeared normal. Recommendations: Followup on path. Surgical Consultation   . Coronary angioplasty  2000. 2009    with stents  . Colonoscopy  09/14/2011    Procedure: COLONOSCOPY;  Surgeon: Corbin Ade, MD;  Location: [facility];  Service: Endoscopy;  Laterality: N/A;  8:30- TCS via colostomy & pt has defibrillator  . Cardiac catheterization  07/13/2013    Medications: Prior to Admission medications   Medication Sig Start Date End Date Taking? Authorizing Provider  amiodarone (PACERONE) 200 MG tablet Take 100-200 mg by mouth See admin instructions. Takes one tablet (200mg  total) in the morning and takes one-half tablet (100mg  total) at bedtime   Yes Historical Provider, MD  aspirin 81 MG EC tablet Take 81 mg by mouth at bedtime.    Yes Historical Provider, MD  citalopram (CELEXA) 10 MG tablet Take 10 mg by mouth every morning.  08/16/11  Yes Historical Provider, MD  co-enzyme Q-10 50 MG capsule Take 50 mg by mouth every morning.    Yes Historical Provider, MD  COUMADIN 2.5 MG tablet Take 2.5 mg by mouth every evening.  07/16/11  Yes Historical Provider, MD  CRESTOR 20 MG tablet Take  20 mg by mouth at bedtime.  08/03/11  Yes Historical Provider, MD  Digoxin 187.5 MCG TABS Take 187.5 mg by mouth every other day. Takes in the morning   Yes Historical Provider, MD  enalapril (VASOTEC) 10 MG tablet Take 10 mg by mouth 2 (two) times daily.  08/12/11  Yes Historical Provider, MD  furosemide (LASIX) 20 MG tablet Take 20-40 mg by mouth every morning.   Yes Historical Provider, MD  insulin aspart (NOVOLOG) 100 UNIT/ML injection Inject 12-14 Units into the skin 3 (three) times daily before meals. 14 units at breakfast and 12 units a lunch and 12 units at supper per sliding scale    Yes Historical Provider, MD  insulin glargine (LANTUS) 100 UNIT/ML injection Inject 24 Units into  the skin at bedtime.     Yes Historical Provider, MD  isosorbide mononitrate (IMDUR) 30 MG 24 hr tablet Take 15 mg by mouth 2 (two) times daily.   Yes Historical Provider, MD  metFORMIN (GLUCOPHAGE) 500 MG tablet Take 500 mg by mouth 2 (two) times daily with a meal.   Yes Historical Provider, MD  metoprolol (TOPROL-XL) 200 MG 24 hr tablet Take 200 mg by mouth every morning.    Yes Historical Provider, MD  niacin (NIASPAN) 1000 MG CR tablet Take 1,000 mg by mouth at bedtime.     Yes Historical Provider, MD  nitroGLYCERIN (NITROLINGUAL) 0.4 MG/SPRAY spray Place 1 spray under the tongue every 5 (five) minutes as needed. angina   Yes Historical Provider, MD  Omega-3 Fatty Acids (FISH OIL) 1200 MG CAPS Take 1,200 mg by mouth 2 (two) times daily.     Yes Historical Provider, MD  pantoprazole (PROTONIX) 40 MG tablet Take 40 mg by mouth Daily.  07/19/11  Yes Historical Provider, MD  ranolazine (RANEXA) 1000 MG SR tablet Take 1,000 mg by mouth 2 (two) times daily.   Yes Historical Provider, MD  spironolactone (ALDACTONE) 25 MG tablet Take 25 mg by mouth every morning.    Yes Historical Provider, MD    Allergies:  No Known Allergies  Social History:  reports that he has never smoked. He has never used smokeless tobacco. He reports that he does not drink alcohol or use illicit drugs.  Family History: Family History  Problem Relation Age of Onset  . Colon cancer Mother 12  . Colon cancer Sister 5  . Coronary artery disease Father     Physical Exam: Filed Vitals:   10/23/13 1816 10/23/13 1835 10/23/13 1900 10/23/13 1915  BP: 117/73 117/73 109/72   Pulse: 120 119 119 117  Temp:    98.5 F (36.9 C)  TempSrc:      Resp: 16 17  21   Height:      Weight:      SpO2: 98% 100% 97% 99%   General appearance: alert, cooperative and no distress Head: Normocephalic, without obvious abnormality, atraumatic Eyes: negative Nose: Nares normal. Septum midline. Mucosa normal. No drainage or sinus  tenderness. Neck: no JVD and supple, symmetrical, trachea midline Lungs: clear to auscultation bilaterally Heart: regular rate and rhythm, S1, S2 normal, no murmur, click, rub or gallop Abdomen: soft, non-tender; bowel sounds normal; no masses,  no organomegaly ostomy c/d/i Extremities: extremities normal, atraumatic, no cyanosis or edema Pulses: 2+ and symmetric Skin: Skin color, texture, turgor normal. No rashes or lesions Neurologic: Grossly normal    Labs on Admission:   Recent Labs  10/23/13 1644  NA 135  K 4.1  CL 96  CO2 24  GLUCOSE 321*  BUN 26*  CREATININE 1.50*  CALCIUM 9.8    Recent Labs  10/23/13 1644  WBC 10.6*  NEUTROABS 8.9*  HGB 13.4  HCT 40.8  MCV 89.9  PLT 245    Recent Labs  10/23/13 1644 10/23/13 1845  TROPONINI <0.30 <0.30   Radiological Exams on Admission: Dg Chest Port 1 View  10/23/2013   CLINICAL DATA:  Chest pain.  EXAM: PORTABLE CHEST - 1 VIEW  COMPARISON:  07/30/2013.  FINDINGS: There is stable cardiomegaly. There is a 3 lead cardiac pacer. There is no pleural effusion or pneumothorax. There is no focal consolidation. There is bilateral interstitial thickening with cephalization and prominence of the central pulmonary vasculature most concerning for congestive failure. The osseous structures are unremarkable.  IMPRESSION: Findings most concerning for mild congestive failure.   Electronically Signed   By: Elige Ko   On: 10/23/2013 16:40    Assessment/Plan  57 yo male with acute on chronic systolic chf with afib w rvr now sinus tachycardia   Principal Problem:   Acute on chronic clinical systolic heart failure- responding to iv lasix nicely.  Place on chf pathway.  Serial enzymes.  Cont optimizing long term medical management.  Active Problems:   CARDIOMYOPATHY, ISCHEMIC, with BiV ICD, st Jude EF 23%   Long term (current) use of anticoagulants   SOB (shortness of breath)   Unstable angina   Atrial fibrillation with RVR-  resolved now in mild sinus tachycardia.  Fully anticoagulated.    Emmanuela Ghazi A 10/23/2013, 8:04 PM

## 2013-10-23 NOTE — ED Provider Notes (Signed)
CSN: [id number]     Arrival date & time 10/23/13  1603 History   First MD Initiated Contact with Patient 10/23/13 1607     Chief Complaint  Patient presents with  . Chest Pain    HPI  Patient presents with a discomfort in his chest and fatigue feeling. He states that he feels very similar to what he did "all summer". Pt states that between May and August of this year he was in atrial fibrillation. Ultimately saw his cardiologist in August and had medications adjusted. Per his report he felt better. He is admitted twice in August of episodes of congestive heart failure. His meds were adjusted and he is made significant dietary changes. R. catheterization with Dr. Gery Pray in August. Showed no change from his most recent cath in 2009.  No culprit lesions. Medical therapy recommended.   Does not feel short of breath as much as in August when he was found to be in congestive heart failure. Had a bit of what he describes as pressure across his chest. He took a nitroglycerin spray that really didn't help. Describes it more as a shortness of breath and pressure rather than true pain. Has history of anterior wall MI. His  baseline ejection fraction is around 20.  He has an AICD. He is on amiodarone, Coumadin, digoxin, metoprolol, and Ranexa.  He took an extra dose of Lasix and spironolactone 2-3 hours ago. He has had good urine output since then.   Past Medical History  Diagnosis Date  . Unspecified essential hypertension   . Type II or unspecified type diabetes mellitus without mention of complication, not stated as uncontrolled   . 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 CAD (coronary artery disease)     hx Ant MI 2000 with stent to LAD,  stent to LAD again in 2009. last cath 2009  . S/P colonoscopy 08/26/2008    Normal via ostomy  . CHF (congestive heart failure)   . Acute myocardial infarction, unspecified site, episode of care unspecified 2000  . Malignant neoplasm of rectum  09/2007    invasive adenocarcinoma  . Prostate cancer   . GERD (gastroesophageal reflux disease)   . Cardiomyopathy, ischemic     with Hx of BIV ICD St. Jude EF 23%   Past Surgical History  Procedure Laterality Date  . Internal defibrillator and pacemaker  2010    x2 St. Jude device  . Abdominal and perineal resection of rectum with total mesorectal excision      10/04/2007  . Colonoscopy      05/17/2007. IMPRESSION: Semilunar, apple-core neoplasm low in the rectum (palpable on digital rectal exam) beginning at 5 cm and corkscrewing up 5 cm in length. This was a low rectal lesion consistent with colorectal carcinoma. It was biosied multiple times. The upstream colon all the way to the cecum appeared normal. Recommendations: Followup on path. Surgical Consultation   . Coronary angioplasty  2000. 2009    with stents  . Colonoscopy  09/14/2011    Procedure: COLONOSCOPY;  Surgeon: Corbin Ade, MD;  Location: [facility];  Service: Endoscopy;  Laterality: N/A;  8:30- TCS via colostomy & pt has defibrillator  . Cardiac catheterization  07/13/2013   Family History  Problem Relation Age of Onset  . Colon cancer Mother 18  . Colon cancer Sister 78  . Coronary artery disease Father    History  Substance Use Topics  . Smoking status: Never Smoker   .  Smokeless tobacco: Never Used  . Alcohol Use: No     Comment: former user    Review of Systems  Constitutional: Positive for fatigue. Negative for fever, chills, diaphoresis and appetite change.  HENT: Negative for mouth sores, sore throat and trouble swallowing.   Eyes: Negative for visual disturbance.  Respiratory: Positive for chest tightness and shortness of breath. Negative for cough and wheezing.   Cardiovascular: Positive for palpitations. Negative for chest pain and leg swelling.  Gastrointestinal: Positive for abdominal distention. Negative for nausea, vomiting, abdominal pain and diarrhea.  Endocrine: Negative for polydipsia,  polyphagia and polyuria.  Genitourinary: Negative for dysuria, frequency and hematuria.  Musculoskeletal: Negative for gait problem.  Skin: Negative for color change, pallor and rash.  Neurological: Negative for dizziness, syncope, light-headedness and headaches.  Hematological: Does not bruise/bleed easily.  Psychiatric/Behavioral: Negative for behavioral problems and confusion.    Allergies  Review of patient's allergies indicates no known allergies.  Home Medications   No current outpatient prescriptions on file. BP 99/67  Pulse 100  Temp(Src) 98.3 F (36.8 C) (Oral)  Resp 22  Ht 5\' 10"  (1.778 m)  Wt 220 lb (99.791 kg)  BMI 31.57 kg/m2  SpO2 96% Physical Exam  Constitutional: He is oriented to person, place, and time. No distress.  Is obese. No distress. Conversant without dyspnea.  HENT:  Head: Normocephalic.  Moist mucous membranes  Eyes: Conjunctivae are normal. Pupils are equal, round, and reactive to light. No scleral icterus.  Conjunctiva are not pale  Neck: Normal range of motion. Neck supple. No JVD present. No thyromegaly present.  Cardiovascular: An irregular rhythm present.  Extrasystoles are present. Tachycardia present.  Exam reveals no gallop and no friction rub.   No murmur heard. A. fib with a wide complex. Review of his EKG shows a history of a right bundle-branch block.  Pulmonary/Chest: Effort normal and breath sounds normal. No respiratory distress. He has no wheezes. He has no rales.  Clear lungs.  Does not have a gallop.  Abdominal: Soft. Bowel sounds are normal. He exhibits no distension. There is no tenderness. There is no rebound.    Musculoskeletal: Normal range of motion.  Neurological: He is alert and oriented to person, place, and time.  Skin: Skin is warm and dry. No rash noted.  No peripheral edema  Psychiatric: He has a normal mood and affect. His behavior is normal.    ED Course  Procedures (including critical care time) Labs  Review Labs Reviewed  PROTIME-INR - Abnormal; Notable for the following:    Prothrombin Time 24.3 (*)    INR 2.27 (*)    All other components within normal limits  APTT - Abnormal; Notable for the following:    aPTT 38 (*)    All other components within normal limits  CBC WITH DIFFERENTIAL - Abnormal; Notable for the following:    WBC 10.6 (*)    Neutrophils Relative % 84 (*)    Neutro Abs 8.9 (*)    Lymphocytes Relative 8 (*)    All other components within normal limits  BASIC METABOLIC PANEL - Abnormal; Notable for the following:    Glucose, Bld 321 (*)    BUN 26 (*)    Creatinine, Ser 1.50 (*)    GFR calc non Af Amer 49 (*)    GFR calc Af Amer 57 (*)    All other components within normal limits  PRO B NATRIURETIC PEPTIDE - Abnormal; Notable for the following:  Pro B Natriuretic peptide (BNP) 5419.0 (*)    All other components within normal limits  DIGOXIN LEVEL  TROPONIN I  TROPONIN I  BASIC METABOLIC PANEL  TROPONIN I  TROPONIN I  TROPONIN I  PROTIME-INR   Imaging Review Dg Chest Port 1 View  10/23/2013   CLINICAL DATA:  Chest pain.  EXAM: PORTABLE CHEST - 1 VIEW  COMPARISON:  07/30/2013.  FINDINGS: There is stable cardiomegaly. There is a 3 lead cardiac pacer. There is no pleural effusion or pneumothorax. There is no focal consolidation. There is bilateral interstitial thickening with cephalization and prominence of the central pulmonary vasculature most concerning for congestive failure. The osseous structures are unremarkable.  IMPRESSION: Findings most concerning for mild congestive failure.   Electronically Signed   By: Elige Ko   On: 10/23/2013 16:40    EKG Interpretation    Date/Time:    Ventricular Rate:    PR Interval:    QRS Duration:   QT Interval:    QTC Calculation:   R Axis:     Text Interpretation:              MDM   1. Paroxysmal atrial fibrillation   2. CHF (congestive heart failure)   3. Acute on chronic clinical systolic  heart failure   4. Atrial fibrillation with RVR   5. CAD (coronary artery disease), native coronary artery   6. Long term (current) use of anticoagulants   7. SOB (shortness of breath)   8. Unspecified essential hypertension   9. Unstable angina     Upon arrival he is irregular pulse he appears to be in atrial fibrillation on the monitor. He is an old right bundle branch block pattern.  He is dyspneic. Chest x-ray shows interstitial fluid. BNP is elevated at over 5000. His given Lopressor 5 mg IV total of 3 doses. His rate is still 118-120. However his rhythm has changed from atrial fibrillation and what appears to be a sinus rhythm. This is a different morphology than his paced rhythm. As soon as he converts,  symptoms almost immediately resolved. Initial and repeat troponin are normal.    Roney Marion, MD 10/23/13 2218

## 2013-10-24 ENCOUNTER — Encounter (HOSPITAL_COMMUNITY): Payer: Self-pay | Admitting: Cardiology

## 2013-10-24 DIAGNOSIS — Z7901 Long term (current) use of anticoagulants: Secondary | ICD-10-CM | POA: Diagnosis not present

## 2013-10-24 DIAGNOSIS — E119 Type 2 diabetes mellitus without complications: Secondary | ICD-10-CM

## 2013-10-24 DIAGNOSIS — I2510 Atherosclerotic heart disease of native coronary artery without angina pectoris: Secondary | ICD-10-CM | POA: Diagnosis not present

## 2013-10-24 DIAGNOSIS — I4891 Unspecified atrial fibrillation: Secondary | ICD-10-CM | POA: Diagnosis not present

## 2013-10-24 DIAGNOSIS — I5023 Acute on chronic systolic (congestive) heart failure: Secondary | ICD-10-CM | POA: Diagnosis not present

## 2013-10-24 DIAGNOSIS — I4892 Unspecified atrial flutter: Secondary | ICD-10-CM | POA: Diagnosis not present

## 2013-10-24 DIAGNOSIS — J9600 Acute respiratory failure, unspecified whether with hypoxia or hypercapnia: Secondary | ICD-10-CM | POA: Diagnosis not present

## 2013-10-24 DIAGNOSIS — I200 Unstable angina: Secondary | ICD-10-CM | POA: Diagnosis not present

## 2013-10-24 LAB — BASIC METABOLIC PANEL
CO2: 27 mEq/L (ref 19–32)
Calcium: 9.1 mg/dL (ref 8.4–10.5)
Creatinine, Ser: 1.45 mg/dL — ABNORMAL HIGH (ref 0.50–1.35)
GFR calc Af Amer: 59 mL/min — ABNORMAL LOW (ref >90)

## 2013-10-24 LAB — GLUCOSE, CAPILLARY
Glucose-Capillary: 201 mg/dL — ABNORMAL HIGH (ref 70–99)
Glucose-Capillary: 156 mg/dL — ABNORMAL HIGH (ref 70–99)
Glucose-Capillary: 166 mg/dL — ABNORMAL HIGH (ref 70–99)

## 2013-10-24 LAB — PROTIME-INR: INR: 2.23 — ABNORMAL HIGH (ref 0.00–1.49)

## 2013-10-24 MED ORDER — AMIODARONE HCL 200 MG PO TABS
200.0000 mg | ORAL_TABLET | Freq: Two times a day (BID) | ORAL | Status: DC
  Administered 2013-10-24 – 2013-10-25 (×2): 200 mg via ORAL
  Filled 2013-10-24 (×2): qty 1

## 2013-10-24 NOTE — Progress Notes (Signed)
[facility] PROGRESS NOTE  DEMARIS LEAVELL WJX:[id number] DOB: Jun 26, 1956 DOA: 10/23/2013 PCP: Cassell Smiles., MD  Assessment/Plan: 1. Acute on chronic systolic congestive heart failure. Patient with a history of ischemic cardiomyopathy, having 11 to ejection fraction of 30% based on transthoracic echocardiogram of 08/15/2013. Study revealed severe hypokinesis of the anterolateral and apical myocardium. Suspect A. fib with RVR may have precipitated acute on chronic systolic heart failure. Ventricular rates now improved. Plan to continue one more day of IV Lasix with plans to transition to oral diuretic therapy tomorrow. 2. Paroxysmal atrial fibrillation/atrial flutter. Presently rate controlled. He has been seen and evaluated by cardiology, recommended increasing his amiodarone to 200 mg twice daily. Will require close followup with his cardiologist for consideration of cardioversion or perhaps ablation and rhythm cannot be managed clinically.  3. Coronary artery disease. Patient did report chest discomfort however had 3 negative troponins. Presently chest pain-free. I suspect chest discomfort may have resulted from acute CHF. Continue statin, nitrate, aspirin, and beta blocker 4. Chronic anticoagulation. Patient warfarin therapy having a therapeutic INR, will continue current dose of warfarin. 5. Type 2 diabetes mellitus. Continue insulin glargine along with sliding scale and Accu-Cheks q. a.c. each bedtime 6. Hypertension. Blood pressure stable, continue metoprolol, enalapril, isosorbide mononitrate and diuretic therapy  Code Status: Full code Disposition Plan: Continue IV diuresis for another 24-hours, monitor ins and outs   Consultants:  Cardiology  HPI/Subjective: Patient is a pleasant 57 year old general with a past medical history of coronary artery disease and ischemic cardiomyopathy, having ejection fraction of 30%, admitted on 10/23/2013, presenting with acute decompensated  heart failure. Patient has been seen and evaluated by cardiology, recommended increasing amiodarone to 200 mg twice daily. He is on chronic anticoagulation with a therapeutic INR. Patient this morning reports feeling much better, with improvement to his respiratory symptoms.  Objective: Filed Vitals:   10/24/13 1043  BP: 112/94  Pulse:   Temp:   Resp:     Intake/Output Summary (Last 24 hours) at 10/24/13 1258 Last data filed at 10/24/13 1100  Gross per 24 hour  Intake      0 ml  Output   2625 ml  Net  -2625 ml   Filed Weights   10/23/13 1616 10/23/13 2138 10/24/13 0500  Weight: 99.791 kg (220 lb) 99.7 kg (219 lb 12.8 oz) 99.2 kg (218 lb 11.1 oz)    Exam:   General:  Patient is in no acute distress, reports doing well, much improved.  Cardiovascular: Dear regular rate and rhythm normal S1-S2 no murmurs rubs or gallop  Respiratory: Patient having normal respiratory effort, off of supplemental oxygen, having a few bibasal crackles  Abdomen: Soft nontender nondistended  Musculoskeletal: Patient did not have significant edema to lower extremities  Data Reviewed: Basic Metabolic Panel:  Recent Labs Lab 10/23/13 1644 10/24/13 0333  NA 135 134*  K 4.1 3.6  CL 96 96  CO2 24 27  GLUCOSE 321* 210*  BUN 26* 22  CREATININE 1.50* 1.45*  CALCIUM 9.8 9.1   Liver Function Tests: No results found for this basename: AST, ALT, ALKPHOS, BILITOT, PROT, ALBUMIN,  in the last 168 hours No results found for this basename: LIPASE, AMYLASE,  in the last 168 hours No results found for this basename: AMMONIA,  in the last 168 hours CBC:  Recent Labs Lab 10/23/13 1644  WBC 10.6*  NEUTROABS 8.9*  HGB 13.4  HCT 40.8  MCV 89.9  PLT 245   Cardiac Enzymes:  Recent Labs  Lab 10/23/13 1644 10/23/13 1845 10/23/13 2145 10/24/13 0333 10/24/13 0945  TROPONINI <0.30 <0.30 <0.30 <0.30 <0.30   BNP (last 3 results)  Recent Labs  07/13/13 0545 07/29/13 1158 10/23/13 1644   PROBNP 3829.0* 2014.0* 5419.0*   CBG:  Recent Labs Lab 10/24/13 0752 10/24/13 1139  GLUCAP 177* 201*    No results found for this or any previous visit (from the past 240 hour(s)).   Studies: Dg Chest Port 1 View  10/23/2013   CLINICAL DATA:  Chest pain.  EXAM: PORTABLE CHEST - 1 VIEW  COMPARISON:  07/30/2013.  FINDINGS: There is stable cardiomegaly. There is a 3 lead cardiac pacer. There is no pleural effusion or pneumothorax. There is no focal consolidation. There is bilateral interstitial thickening with cephalization and prominence of the central pulmonary vasculature most concerning for congestive failure. The osseous structures are unremarkable.  IMPRESSION: Findings most concerning for mild congestive failure.   Electronically Signed   By: Elige Ko   On: 10/23/2013 16:40    Scheduled Meds: . amiodarone  200 mg Oral BID  . aspirin EC  81 mg Oral QHS  . atorvastatin  40 mg Oral q1800  . citalopram  10 mg Oral q morning - 10a  . [START ON 10/25/2013] digoxin  0.1875 mg Oral QODAY  . enalapril  10 mg Oral BID  . furosemide  40 mg Intravenous BID  . insulin aspart  12 Units Subcutaneous BID AC  . insulin aspart  14 Units Subcutaneous Q breakfast  . insulin glargine  24 Units Subcutaneous QHS  . isosorbide mononitrate  15 mg Oral BID  . metoprolol  200 mg Oral q morning - 10a  . niacin  1,000 mg Oral QHS  . nitroGLYCERIN  0.5 inch Topical Q6H  . pantoprazole  40 mg Oral Daily  . ranolazine  1,000 mg Oral BID  . sodium chloride  3 mL Intravenous Q12H  . spironolactone  25 mg Oral q morning - 10a  . warfarin  2.5 mg Oral QPM  . Warfarin - Physician Dosing Inpatient   Does not apply q1800   Continuous Infusions:   Principal Problem:   Acute on chronic clinical systolic heart failure Active Problems:   CARDIOMYOPATHY, ISCHEMIC, with BiV ICD, st Jude EF 23%   Long term (current) use of anticoagulants   SOB (shortness of breath)   Unstable angina   Atrial  fibrillation with RVR   Atrial flutter    Time spent: 35 minutes    Jeralyn Bennett  [facility] Pager [phone]. If 7PM-7AM, please contact night-coverage at [email or web address], password [facility] 10/24/2013, 12:58 PM  LOS: 1 day

## 2013-10-24 NOTE — Progress Notes (Signed)
Inpatient Diabetes Program Recommendations  AACE/ADA: New Consensus Statement on Inpatient Glycemic Control (2013)  Target Ranges:  Prepandial:   less than 140 mg/dL      Peak postprandial:   less than 180 mg/dL (1-2 hours)      Critically ill patients:  140 - 180 mg/dL  Results for ANDER, WAMSER (MRN [id number]) as of 10/24/2013 09:43  Ref. Range 07/10/2013 19:56  Hemoglobin A1C Latest Range: <5.7 % 11.1 (H)   Results for CHANNON, AMBROSINI (MRN [id number]) as of 10/24/2013 09:43  Ref. Range 10/23/2013 16:44 10/24/2013 03:33  Glucose Latest Range: 70-99 mg/dL 130 (H) 865 (H)   Results for JERIME, ARIF (MRN [id number]) as of 10/24/2013 09:43  Ref. Range 10/24/2013 07:52  Glucose-Capillary Latest Range: 70-99 mg/dL 284 (H)   Inpatient Diabetes Program Recommendations Correction (SSI): Please order Novolog moderate correction scale ACHS. HgbA1C: Please consider ordering an A1C to determine glycemic control over the past 2-3 months.  Note: Patient has a history of diabetes and takes Lantus 24 units QHS, Novolog 14 units with breakfast, Novolog 12 units with lunch, Novolog 12 units with supper, and Metformin 500 mg BID as an outpatient for diabetes management.  Currently, patient is ordered to receive Lantus 24 units QHS, Novolog14 units with breakfast, Novolog 12 units with lunch, Novolog 12 units with supper   for inpatient glycemic control.  Initial lab glucose noted to be 321 mg/dl and fasting glucose this morning was 177 mg/dl.  Please order Novolog correction scale ACHS while inpatient and order an A1C to determine glycemic control over the past 2-3 months.  Will continue to follow.  Thanks, Orlando Penner, RN, MSN, CCRN Diabetes Coordinator Inpatient Diabetes Program [phone] (Team Pager) [phone] ([facility] office) [phone] [facility] office)

## 2013-10-24 NOTE — Progress Notes (Signed)
UR chart review completed.  

## 2013-10-24 NOTE — Consult Note (Signed)
Primary cardiologist: Dr. Allyson Sabal, Dr. Pernell Dupre [facility]) Consulting cardiologist: Dr. Diona Browner  Clinical Summary Mr. Laubacher is a 57 y.o.male with a history of CAD and ischemic cardiomyopathy s/p St Jude BiV-ICD, LVEF 30% in 08/15/13. He now is followed by Dr. Allyson Sabal (former Dr. Alanda Amass patient) and sees Dr. Elayne Guerin at [facility] for CHF management. He was admitted twice in August for increased dyspnea related to volume overload likely due to dietary non-compliance.  Prior to his admit he was eating salted peanuts and he said that he was unaware of dietary recommendations for HF.  Diuresed with IV lasix. Dig level 0.4. Discharged on 20 mg lasix. Also had recurrent atrial fibrillation treated with increased Amiodarone doses and on Coumadin. His last cardiac catheterization in August of this year showed patient LAD stent site, occluded ramus (collaterals present), moderate disease diffusely that was managed medically. He did see Dr. Gala Romney in the Heart Failure clinic in August and was doing well. Lives at home with his wife. Disabled since 2000. States has been compliant with medications. Tries to follow low salt food diet.  He goes hunting and fishing and watches his grandchildren.  Now presents complaining of increased shortness of breath since Monday, no change in diet or fluid intake, some angina, no palpitations. Cardiac markers argue against ACS. Cardiac rhythm at this time looks to be atypical atrial flutter versus ectopic atrial tachycardia, some episodes of 2:1 block with tachycardia, slower this morning. Chronic bundle branch block at baseline    No Known Allergies  Medications Scheduled Medications: . amiodarone  100 mg Oral QHS  . amiodarone  200 mg Oral Daily  . aspirin EC  81 mg Oral QHS  . atorvastatin  40 mg Oral q1800  . citalopram  10 mg Oral q morning - 10a  . [START ON 10/25/2013] digoxin  0.1875 mg Oral QODAY  . enalapril  10 mg Oral BID  . furosemide  40 mg Intravenous BID   . insulin aspart  12 Units Subcutaneous BID AC  . insulin aspart  14 Units Subcutaneous Q breakfast  . insulin glargine  24 Units Subcutaneous QHS  . isosorbide mononitrate  15 mg Oral BID  . metoprolol  200 mg Oral q morning - 10a  . niacin  1,000 mg Oral QHS  . nitroGLYCERIN  0.5 inch Topical Q6H  . pantoprazole  40 mg Oral Daily  . ranolazine  1,000 mg Oral BID  . sodium chloride  3 mL Intravenous Q12H  . spironolactone  25 mg Oral q morning - 10a  . warfarin  2.5 mg Oral QPM  . Warfarin - Physician Dosing Inpatient   Does not apply q1800      PRN Medications:  sodium chloride, acetaminophen, LORazepam, ondansetron (ZOFRAN) IV, sodium chloride   Past Medical History  Diagnosis Date  . Essential hypertension, benign   . Type 2 diabetes mellitus   . Coronary atherosclerosis of native coronary artery     BMS to LAD 2001 at [facility], PTCA/atherectomy ramus and BMS to LAD 2009  . S/P colonoscopy     Normal via ostomy - September 2009  . Chronic systolic heart failure   . Myocardial infarction, anterior wall     T[facility] with [facility]PA [facility]  . 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 Prostate cancer   . GERD (gastroesophageal reflux disease)   . Cardiomyopathy, ischemic     BIV ICD St. Jude, LVEF 23%  . Paroxysmal  atrial fibrillation     Amiodarone and Coumadin    Past Surgical History  Procedure Laterality Date  . Internal defibrillator and pacemaker  2010    x2 St. Jude device  . Abdominal and perineal resection of rectum with total mesorectal excision      10/04/2007  . Colonoscopy      05/17/2007. IMPRESSION: Semilunar, apple-core neoplasm low in the rectum (palpable on digital rectal exam) beginning at 5 cm and corkscrewing up 5 cm in length. This was a low rectal lesion consistent with colorectal carcinoma. It was biosied multiple times. The upstream colon all the way to the cecum appeared normal. Recommendations: Followup on path. Surgical Consultation   .  Colonoscopy  09/14/2011    Procedure: COLONOSCOPY;  Surgeon: Corbin Ade, MD;  Location: [facility] ENDO SUITE;  Service: Endoscopy;  Laterality: N/A;  8:30- TCS via colostomy & pt has defibrillator    Family History  Problem Relation Age of Onset  . Colon cancer Mother 37  . Colon cancer Sister 69  . Coronary artery disease Father     Social History Mr. Wilmes reports that he has never smoked. He has never used smokeless tobacco. Mr. Gilmer reports that he does not drink alcohol.  Review of Systems No sense of palpitations, no device discharges or syncope. Stable appetite. No recent orthopnea. No progressive leg edema. Otherwise negative.  Physical Examination Blood pressure 112/94, pulse 90, temperature 98.6 F (37 C), temperature source Oral, resp. rate 20, height 5\' 10"  (1.778 m), weight 218 lb 11.1 oz (99.2 kg), SpO2 97.00%.  Intake/Output Summary (Last 24 hours) at 10/24/13 1153 Last data filed at 10/24/13 1100  Gross per 24 hour  Intake      0 ml  Output   2625 ml  Net  -2625 ml   Telemetry: Looks to be atypical atrial flutter with variable conduction, heart rate under 100.  Appears comfortable at rest. HEENT: Conjunctiva and lids normal, oropharynx clear with moist mucosa. Neck: Supple, no elevated JVP or carotid bruits, no thyromegaly. Lungs: Few crackles at bases, nonlabored breathing at rest. Thorax: Stable device pocket site Cardiac: Irregular, no S3 or significant systolic murmur, indistinct PMI, no pericardial rub. Abdomen: Soft, nontender, bowel sounds present, no guarding or rebound. Extremities: Trace edema, distal pulses 2+. Skin: Warm and dry. Musculoskeletal: No kyphosis. Neuropsychiatric: Alert and oriented x3, affect grossly appropriate.   Lab Results  Basic Metabolic Panel:  Recent Labs Lab 10/23/13 1644 10/24/13 0333  NA 135 134*  K 4.1 3.6  CL 96 96  CO2 24 27  GLUCOSE 321* 210*  BUN 26* 22  CREATININE 1.50* 1.45*  CALCIUM 9.8 9.1     CBC:  Recent Labs Lab 10/23/13 1644  WBC 10.6*  NEUTROABS 8.9*  HGB 13.4  HCT 40.8  MCV 89.9  PLT 245    Cardiac Enzymes:  Recent Labs Lab 10/23/13 1644 10/23/13 1845 10/23/13 2145 10/24/13 0333 10/24/13 0945  TROPONINI <0.30 <0.30 <0.30 <0.30 <0.30    Imaging PORTABLE CHEST - 1 VIEW  COMPARISON: 07/30/2013.  FINDINGS: There is stable cardiomegaly. There is a 3 lead cardiac pacer. There is no pleural effusion or pneumothorax. There is no focal consolidation. There is bilateral interstitial thickening with cephalization and prominence of the central pulmonary vasculature most concerning for congestive failure. The osseous structures are unremarkable.  IMPRESSION: Findings most concerning for mild congestive failure.   Impression  1. Presentation with mild acute on chronic systolic heart failure, likely complicated by recurrent  atrial arrhythmia. Looks to be an atypical atrial flutter, initially with RVR, now heart rate has improved. This may have occurred around Monday when his symptoms worsened, otherwise no major change in fluid intake or change in medications. Good diuresis with IV Lasix, can likely convert back to oral tomorrow. Would have him ambulate to assess heart rate.  2. History of PAF and atrial flutter. Already on amiodarone and Coumadin. Will increase amiodarone to 200 mg twice daily for now, continue high-dose Toprol-XL. INR is therapeutic. He already has a visit pending with Dr. Allyson Sabal for cardiac followup. May need to be considered for cardioversion and possibly even ablation if rhythm cannot be controlled medically.  3. CAD with ischemic cardiomyopathy, no clear evidence of ACS by enzymes. LVEF 30% status post St. Jude BIV-ICD.  4. CKD, stage 2.   Recommendations  Increase amiodarone to 200 mg twice daily, can likely convert back to oral Lasix tomorrow. Would have him ambulate to assess heart rate control. May be ready for discharge soon,  should keep close followup with Dr. Allyson Sabal as he may need to be considered for cardioversion or even potentially ablation if rhythm cannot be managed medically.   Jonelle Sidle, M.D., F.A.C.C.      ROS Physical Exam

## 2013-10-24 NOTE — Care Management Note (Addendum)
    Page 1 of 1   10/25/2013     11:17:53 AM   CARE MANAGEMENT NOTE 10/25/2013  Patient:  [name], [name]   Account Number:  [id number]  Date Initiated:  10/24/2013  Documentation initiated by:  Sharrie Rothman  Subjective/Objective Assessment:   Pt admitted from home with CHF. Pt lives with his wife and will return home at discharge. Pt is independent with ADL's.     Action/Plan:   No CM needs noted.   Anticipated DC Date:  10/26/2013   Anticipated DC Plan:  HOME/SELF CARE      DC Planning Services  CM consult      Choice offered to / List presented to:             Status of service:  Completed, signed off Medicare Important Message given?  NA - LOS <3 / Initial given by admissions (If response is "NO", the following Medicare IM given date fields will be blank) Date Medicare IM given:   Date Additional Medicare IM given:    Discharge Disposition:  HOME/SELF CARE  Per UR Regulation:    If discussed at Long Length of Stay Meetings, dates discussed:    Comments:  10/25/13 1100 Arlyss Queen, RN BSN CM Pt discharged home today. No CM needs noted.  10/24/13 1355 Arlyss Queen, RN BSN CM

## 2013-10-25 DIAGNOSIS — N289 Disorder of kidney and ureter, unspecified: Secondary | ICD-10-CM

## 2013-10-25 DIAGNOSIS — I2510 Atherosclerotic heart disease of native coronary artery without angina pectoris: Secondary | ICD-10-CM | POA: Diagnosis not present

## 2013-10-25 DIAGNOSIS — Z7901 Long term (current) use of anticoagulants: Secondary | ICD-10-CM | POA: Diagnosis not present

## 2013-10-25 DIAGNOSIS — I5022 Chronic systolic (congestive) heart failure: Secondary | ICD-10-CM

## 2013-10-25 DIAGNOSIS — I200 Unstable angina: Secondary | ICD-10-CM | POA: Diagnosis not present

## 2013-10-25 DIAGNOSIS — I5023 Acute on chronic systolic (congestive) heart failure: Secondary | ICD-10-CM | POA: Diagnosis not present

## 2013-10-25 DIAGNOSIS — I4891 Unspecified atrial fibrillation: Secondary | ICD-10-CM | POA: Diagnosis not present

## 2013-10-25 DIAGNOSIS — I4892 Unspecified atrial flutter: Secondary | ICD-10-CM | POA: Diagnosis not present

## 2013-10-25 LAB — BASIC METABOLIC PANEL
BUN: 25 mg/dL — ABNORMAL HIGH (ref 6–23)
CO2: 24 mEq/L (ref 19–32)
Calcium: 9.4 mg/dL (ref 8.4–10.5)
Chloride: 96 mEq/L (ref 96–112)
GFR calc Af Amer: 57 mL/min — ABNORMAL LOW (ref >90)
Glucose, Bld: 161 mg/dL — ABNORMAL HIGH (ref 70–99)
Potassium: 3.9 mEq/L (ref 3.5–5.1)

## 2013-10-25 LAB — PROTIME-INR
INR: 2.58 — ABNORMAL HIGH (ref 0.00–1.49)
Prothrombin Time: 26.8 seconds — ABNORMAL HIGH (ref 11.6–15.2)

## 2013-10-25 LAB — GLUCOSE, CAPILLARY: Glucose-Capillary: 126 mg/dL — ABNORMAL HIGH (ref 70–99)

## 2013-10-25 MED ORDER — AMIODARONE HCL 200 MG PO TABS: 200.0000 mg | ORAL_TABLET | Freq: Two times a day (BID) | ORAL | Status: DC

## 2013-10-25 MED ORDER — SPIRONOLACTONE 25 MG PO TABS: 25.0000 mg | ORAL_TABLET | Freq: Every morning | ORAL | Status: DC

## 2013-10-25 MED ORDER — FUROSEMIDE 40 MG PO TABS
40.0000 mg | ORAL_TABLET | Freq: Every day | ORAL | Status: DC
  Administered 2013-10-25: 40 mg via ORAL
  Filled 2013-10-25: qty 1

## 2013-10-25 NOTE — Progress Notes (Signed)
Discharge instructions and prescriptions given, verbalized understanding, out in stable condition with staff ambulatory. 

## 2013-10-25 NOTE — Progress Notes (Signed)
Primary cardiologist: Dr. Allyson Sabal (EP Dr. Graciela Husbands) Consulting cardiologist: Dr. Diona Browner  Subjective:    He is breathing better and wants to go home.   Objective:   Temp:  [97.5 F (36.4 C)-97.9 F (36.6 C)] 97.7 F (36.5 C) (11/20 0628) Pulse Rate:  [98-105] 104 (11/20 0628) Resp:  [20] 20 (11/20 0628) BP: (86-112)/(52-94) 90/52 mmHg (11/20 0628) SpO2:  [93 %-97 %] 96 % (11/20 0628) Weight:  [219 lb 9.3 oz (99.6 kg)] 219 lb 9.3 oz (99.6 kg) (11/20 0808) Last BM Date: 10/24/13  Filed Weights   10/23/13 2138 10/24/13 0500 10/25/13 0808  Weight: 219 lb 12.8 oz (99.7 kg) 218 lb 11.1 oz (99.2 kg) 219 lb 9.3 oz (99.6 kg)    Intake/Output Summary (Last 24 hours) at 10/25/13 0837 Last data filed at 10/24/13 2117  Gross per 24 hour  Intake    480 ml  Output   1675 ml  Net  -1195 ml    Telemetry: Ventricular pacing, underlying atrial fib/flutter with rates in the 70's.  Exam:  General: No acute distress.  Lungs: Clear to auscultation, nonlabored.  Cardiac: No elevated JVP or bruits. Irregular, no gallop or rub.   Abdomen: Normoactive bowel sounds, nontender, nondistended. Colostomy on the left abdomen.  Extremities: No pitting edema, distal pulses full.   Lab Results:  Basic Metabolic Panel:  Recent Labs Lab 10/23/13 1644 10/24/13 0333 10/25/13 0456  NA 135 134* 135  K 4.1 3.6 3.9  CL 96 96 96  CO2 24 27 24   GLUCOSE 321* 210* 161*  BUN 26* 22 25*  CREATININE 1.50* 1.45* 1.51*  CALCIUM 9.8 9.1 9.4     CBC:  Recent Labs Lab 10/23/13 1644  WBC 10.6*  HGB 13.4  HCT 40.8  MCV 89.9  PLT 245    Cardiac Enzymes:  Recent Labs Lab 10/23/13 2145 10/24/13 0333 10/24/13 0945  TROPONINI <0.30 <0.30 <0.30    BNP:  Recent Labs  07/13/13 0545 07/29/13 1158 10/23/13 1644  PROBNP 3829.0* 2014.0* 5419.0*    Coagulation:  Recent Labs Lab 10/23/13 1644 10/24/13 0333 10/25/13 0456  INR 2.27* 2.23* 2.58*    Radiology: Dg Chest Port 1  View  10/23/2013   CLINICAL DATA:  Chest pain.  EXAM: PORTABLE CHEST - 1 VIEW  COMPARISON:  07/30/2013.  FINDINGS: There is stable cardiomegaly. There is a 3 lead cardiac pacer. There is no pleural effusion or pneumothorax. There is no focal consolidation. There is bilateral interstitial thickening with cephalization and prominence of the central pulmonary vasculature most concerning for congestive failure. The osseous structures are unremarkable.  IMPRESSION: Findings most concerning for mild congestive failure.   Electronically Signed   By: Elige Ko   On: 10/23/2013 16:40     Medications:   Scheduled Medications: . amiodarone  200 mg Oral BID  . aspirin EC  81 mg Oral QHS  . atorvastatin  40 mg Oral q1800  . citalopram  10 mg Oral q morning - 10a  . digoxin  0.1875 mg Oral QODAY  . enalapril  10 mg Oral BID  . furosemide  40 mg Oral Daily  . insulin aspart  12 Units Subcutaneous BID AC  . insulin aspart  14 Units Subcutaneous Q breakfast  . insulin glargine  24 Units Subcutaneous QHS  . metoprolol  200 mg Oral q morning - 10a  . niacin  1,000 mg Oral QHS  . nitroGLYCERIN  0.5 inch Topical Q6H  . pantoprazole  40  mg Oral Daily  . ranolazine  1,000 mg Oral BID  . sodium chloride  3 mL Intravenous Q12H  . spironolactone  25 mg Oral q morning - 10a  . warfarin  2.5 mg Oral QPM  . Warfarin - Physician Dosing Inpatient   Does not apply q1800    PRN Medications: sodium chloride, acetaminophen, LORazepam, ondansetron (ZOFRAN) IV, sodium chloride   Assessment and Plan:   1. Acute on Chronic Systolic CHF: Diuresed and clinically better. He is reinforced on low sodium diet and has been adherent to this. He is now converted back to PO lasix 40 mg daily. Etiology thought to be related to atrial arrhythmia.  2. Atrial fib/flutter with RVR: Amiodarone was increased to 200 mg BID. He was continued on metoprolol 200 mg dialy and digoxin. Remains on coumadin, and will need careful follow up  concerning dosing with increase in amiodarone. He has an appt with Dr. Allyson Sabal on Dec 4th 3:45, confirmed by phoning the office, which is he instructed to keep. INR today 2.58.   3. S/P ST Jude BiV Pacemaker: Patient states that it was checked 3-4 months ago by Dr. Alanda Amass. Will need to get on schedule to have this rechecked as he continues followup with Dr. Allyson Sabal and Dr. Graciela Husbands.  3. CKD Stage II:  Creatinine 1.51 this am. He will need follow up labs as OP in 2 weeks.   Bettey Mare. Lyman Bishop NP [facility] 10/25/2013, 8:37 AM   Attending note:  Please see consultation note from yesterday. Patient has clinically improved with combination of diuresis and also heart rate control. He has history of atrial fibrillation, rhythm most recently also looks like atypical atrial flutter with variable conduction. Amiodarone has been increased, continues on high-dose beta blocker, therapeutic on Coumadin. Recommend that he keep his followup visit with Dr. Allyson Sabal, as additional adjustments in antiarrhythmics or even DCCV may be necessary, possibly even consideration for ablation. Suspect that his decompensation in heart failure was at least partially related to arrhythmia. Reinforced compliance with medications and diet as well.  Jonelle Sidle, M.D., F.A.C.C.

## 2013-10-25 NOTE — Discharge Summary (Signed)
Physician Discharge Summary  [name]:[id number] DOB: 06-Jan-1956 DOA: 10/23/2013  PCP: Cassell Smiles., MD  Admit date: 10/23/2013 Discharge date: 10/25/2013  Time spent: 35 minutes  Recommendations for Outpatient Follow-up:  1. Please followup on a BMP on hospital followup visit. On day of discharge he had a BUN of 25 and a creatinine of 1.51  Discharge Diagnoses:  Principal Problem:   Acute on chronic clinical systolic heart failure Active Problems:   CARDIOMYOPATHY, ISCHEMIC, with BiV ICD, st Jude EF 23%   Long term (current) use of anticoagulants   SOB (shortness of breath)   Unstable angina   Atrial fibrillation with RVR   Atrial flutter   Discharge Condition: Stable/improved  Diet recommendation: Heart healthy diet  Filed Weights   10/23/13 2138 10/24/13 0500 10/25/13 0808  Weight: 99.7 kg (219 lb 12.8 oz) 99.2 kg (218 lb 11.1 oz) 99.6 kg (219 lb 9.3 oz)    History of present illness:  57 yo male with severe systolic chf with ef 20%, aicd, icm, ostomy due to colon cancer, comes in with several days of worsening sob. He usually takes his ntg spray at home when he gets sob which usually helps. But today it didn't help as much, and he started having sscp. Lasted less than 30 minutes. No le edema or swelling. No fevers. No n/v. Compliant with meds. Not on home oxygen. He has been figthign a cold in the last week with a lot of nasal congestion which is improving. He is back to his baseline resp status with iv lasix in ED, he has urinated approx 2 ltiers already.   Hospital Course:  Patient is a pleasant 57 year old gentleman with a past medical history of chronic systolic congestive heart failure, history of ischemic cardiomyopathy having an ejection fraction of 30% based on a transthoracic echocardiogram on 08/15/2013. This study revealed severe hypokinesis of the anterolateral and apical myocardium. He was admitted into our service on 10/23/2013, presented with  complaints of shortness of breath. It was suspected that A. fib with RVR precipitated acute on chronic systolic heart failure. He was administered IV Lasix. Cardiology consulted, who recommended increasing his amiodarone to 200 mg by mouth twice a day. He previously to 200 mg in a.m. with 100 mg in p.m. Patient did complain of chest discomfort for which troponins were cycled and remained negative for greater than 3 sets. By 10/25/2013 he reported feeling much better and expressed his wishes to be discharged today. He was seen and evaluated by cardiology who agreed with discharge, recommending close followup with his cardiologist Dr. Allyson Sabal in the outpatient setting. He will require close followup as cardioversion or perhaps ablation may be oriented in rhythm cannot be managed medically. There were no changes made to his home diuretic regimen on discharge. He was discharged in stable condition on 10/25/2013.   Consultations:  Cardiology  Discharge Exam: Filed Vitals:   10/25/13 0628  BP: 90/52  Pulse: 104  Temp: 97.7 F (36.5 C)  Resp: 20    General: Patient is in no acute distress, reports doing well, much improved.  Cardiovascular: Dear regular rate and rhythm normal S1-S2 no murmurs rubs or gallop Respiratory: Patient having normal respiratory effort, off of supplemental oxygen, having a few bibasal crackles  Abdomen: Soft nontender nondistended  Musculoskeletal: Patient did not have significant edema to lower extremities   Discharge Instructions  Discharge Orders   Future Appointments Provider Department Dept Phone   11/08/2013 3:45 PM [name], MD  [facility] [phone]   01/25/2014 2:45 PM [facility] [phone]   01/25/2014 3:15 PM Ladene Artist, MD [facility] MEDICAL ONCOL[facility]Y [phone]   Future Orders Complete By Expires   Call MD for:  difficulty breathing, headache or visual disturbances   As directed    Call MD for:  extreme fatigue  As directed    Call MD for:  persistant nausea and vomiting  As directed    Call MD for:  severe uncontrolled pain  As directed    Diet - low sodium heart healthy  As directed    Increase activity slowly  As directed        Medication List         amiodarone 200 MG tablet  Commonly known as:  PACERONE  Take 1 tablet (200 mg total) by mouth 2 (two) times daily.     aspirin 81 MG EC tablet  Take 81 mg by mouth at bedtime.     citalopram 10 MG tablet  Commonly known as:  CELEXA  Take 10 mg by mouth every morning.     co-enzyme Q-10 50 MG capsule  Take 50 mg by mouth every morning.     COUMADIN 2.5 MG tablet  Generic drug:  warfarin  Take 2.5 mg by mouth every evening.     CRESTOR 20 MG tablet  Generic drug:  rosuvastatin  Take 20 mg by mouth at bedtime.     Digoxin 187.5 MCG Tabs  Take 187.5 mg by mouth every other day. Takes in the morning     enalapril 10 MG tablet  Commonly known as:  VASOTEC  Take 10 mg by mouth 2 (two) times daily.     Fish Oil 1200 MG Caps  Take 1,200 mg by mouth 2 (two) times daily.     furosemide 20 MG tablet  Commonly known as:  LASIX  Take 20-40 mg by mouth every morning.     insulin aspart 100 UNIT/ML injection  Commonly known as:  novoLOG  - Inject 12-14 Units into the skin 3 (three) times daily before meals. 14 units at breakfast and 12 units a lunch and 12 units at supper per sliding scale  -      insulin glargine 100 UNIT/ML injection  Commonly known as:  LANTUS  Inject 24 Units into the skin at bedtime.     isosorbide mononitrate 30 MG 24 hr tablet  Commonly known as:  IMDUR  Take 15 mg by mouth 2 (two) times daily.     metFORMIN 500 MG tablet  Commonly known as:  GLUCOPHAGE  Take 500 mg by mouth 2 (two) times daily with a meal.     metoprolol 200 MG 24 hr tablet  Commonly known as:  TOPROL-XL  Take 200 mg by mouth every morning.     niacin 1000 MG CR tablet  Commonly  known as:  NIASPAN  Take 1,000 mg by mouth at bedtime.     nitroGLYCERIN 0.4 MG/SPRAY spray  Commonly known as:  NITROLINGUAL  Place 1 spray under the tongue every 5 (five) minutes as needed. angina     pantoprazole 40 MG tablet  Commonly known as:  PROTONIX  Take 40 mg by mouth Daily.     ranolazine 1000 MG SR tablet  Commonly known as:  RANEXA  Take 1,000 mg by mouth 2 (two) times daily.     spironolactone 25 MG tablet  Commonly known as:  ALDACTONE  Take 25 mg by mouth every morning.       No Known Allergies     Follow-up Information   Follow up with Cassell Smiles., MD In 1 week. (Need to call office to schedule appointment)    Specialty:  Internal Medicine   Contact information:   [address] [place] [address] [phone]       Follow up with [name], MD In 10 days.   Specialty:  Cardiology   Contact information:   [address] [place] [address] [phone]        The results of significant diagnostics from this hospitalization (including imaging, microbiology, ancillary and laboratory) are listed below for reference.    Significant Diagnostic Studies: Dg Chest Port 1 View  10/23/2013   CLINICAL DATA:  Chest pain.  EXAM: PORTABLE CHEST - 1 VIEW  COMPARISON:  07/30/2013.  FINDINGS: There is stable cardiomegaly. There is a 3 lead cardiac pacer. There is no pleural effusion or pneumothorax. There is no focal consolidation. There is bilateral interstitial thickening with cephalization and prominence of the central pulmonary vasculature most concerning for congestive failure. The osseous structures are unremarkable.  IMPRESSION: Findings most concerning for mild congestive failure.   Electronically Signed   By: Elige Ko   On: 10/23/2013 16:40    Microbiology: No results found for this or any previous visit (from the past 240 hour(s)).   Labs: Basic Metabolic Panel:  Recent Labs Lab 10/23/13 1644  10/24/13 0333 10/25/13 0456  NA 135 134* 135  K 4.1 3.6 3.9  CL 96 96 96  CO2 24 27 24   GLUCOSE 321* 210* 161*  BUN 26* 22 25*  CREATININE 1.50* 1.45* 1.51*  CALCIUM 9.8 9.1 9.4   Liver Function Tests: No results found for this basename: AST, ALT, ALKPHOS, BILITOT, PROT, ALBUMIN,  in the last 168 hours No results found for this basename: LIPASE, AMYLASE,  in the last 168 hours No results found for this basename: AMMONIA,  in the last 168 hours CBC:  Recent Labs Lab 10/23/13 1644  WBC 10.6*  NEUTROABS 8.9*  HGB 13.4  HCT 40.8  MCV 89.9  PLT 245   Cardiac Enzymes:  Recent Labs Lab 10/23/13 1644 10/23/13 1845 10/23/13 2145 10/24/13 0333 10/24/13 0945  TROPONINI <0.30 <0.30 <0.30 <0.30 <0.30   BNP: BNP (last 3 results)  Recent Labs  07/13/13 0545 07/29/13 1158 10/23/13 1644  PROBNP 3829.0* 2014.0* 5419.0*   CBG:  Recent Labs Lab 10/24/13 0752 10/24/13 1139 10/24/13 1707 10/24/13 2051 10/25/13 0757  GLUCAP 177* 201* 156* 166* 136*       Signed:  Kaedyn Polivka  [facility] 10/25/2013, 11:04 AM

## 2013-10-29 ENCOUNTER — Emergency Department (HOSPITAL_COMMUNITY)
Admission: EM | Admit: 2013-10-29 | Discharge: 2013-10-29 | Disposition: A | Discharge status: Home / Self Care | Payer: Medicare Other | Attending: Emergency Medicine | Admitting: Emergency Medicine

## 2013-10-29 ENCOUNTER — Encounter (HOSPITAL_COMMUNITY): Payer: Self-pay | Admitting: Emergency Medicine

## 2013-10-29 ENCOUNTER — Emergency Department (HOSPITAL_COMMUNITY): Payer: Medicare Other

## 2013-10-29 DIAGNOSIS — Z950 Presence of cardiac pacemaker: Secondary | ICD-10-CM | POA: Insufficient documentation

## 2013-10-29 DIAGNOSIS — I252 Old myocardial infarction: Secondary | ICD-10-CM | POA: Insufficient documentation

## 2013-10-29 DIAGNOSIS — I4891 Unspecified atrial fibrillation: Secondary | ICD-10-CM | POA: Diagnosis not present

## 2013-10-29 DIAGNOSIS — Z7982 Long term (current) use of aspirin: Secondary | ICD-10-CM | POA: Insufficient documentation

## 2013-10-29 DIAGNOSIS — Z6834 Body mass index (BMI) 34.0-34.9, adult: Secondary | ICD-10-CM | POA: Diagnosis not present

## 2013-10-29 DIAGNOSIS — Z794 Long term (current) use of insulin: Secondary | ICD-10-CM | POA: Diagnosis not present

## 2013-10-29 DIAGNOSIS — I10 Essential (primary) hypertension: Secondary | ICD-10-CM | POA: Insufficient documentation

## 2013-10-29 DIAGNOSIS — Z8679 Personal history of other diseases of the circulatory system: Secondary | ICD-10-CM

## 2013-10-29 DIAGNOSIS — R5381 Other malaise: Secondary | ICD-10-CM | POA: Diagnosis not present

## 2013-10-29 DIAGNOSIS — I2510 Atherosclerotic heart disease of native coronary artery without angina pectoris: Secondary | ICD-10-CM | POA: Diagnosis not present

## 2013-10-29 DIAGNOSIS — Z7901 Long term (current) use of anticoagulants: Secondary | ICD-10-CM | POA: Diagnosis not present

## 2013-10-29 DIAGNOSIS — Z85048 Personal history of other malignant neoplasm of rectum, rectosigmoid junction, and anus: Secondary | ICD-10-CM | POA: Diagnosis not present

## 2013-10-29 DIAGNOSIS — Z95810 Presence of automatic (implantable) cardiac defibrillator: Secondary | ICD-10-CM | POA: Diagnosis not present

## 2013-10-29 DIAGNOSIS — K219 Gastro-esophageal reflux disease without esophagitis: Secondary | ICD-10-CM | POA: Diagnosis not present

## 2013-10-29 DIAGNOSIS — R55 Syncope and collapse: Secondary | ICD-10-CM | POA: Diagnosis not present

## 2013-10-29 DIAGNOSIS — I959 Hypotension, unspecified: Secondary | ICD-10-CM | POA: Diagnosis not present

## 2013-10-29 DIAGNOSIS — E119 Type 2 diabetes mellitus without complications: Secondary | ICD-10-CM | POA: Insufficient documentation

## 2013-10-29 DIAGNOSIS — Z79899 Other long term (current) drug therapy: Secondary | ICD-10-CM | POA: Insufficient documentation

## 2013-10-29 DIAGNOSIS — Z8546 Personal history of malignant neoplasm of prostate: Secondary | ICD-10-CM | POA: Diagnosis not present

## 2013-10-29 DIAGNOSIS — I5022 Chronic systolic (congestive) heart failure: Secondary | ICD-10-CM | POA: Insufficient documentation

## 2013-10-29 LAB — BASIC METABOLIC PANEL
BUN: 29 mg/dL — ABNORMAL HIGH (ref 6–23)
CO2: 21 mEq/L (ref 19–32)
Calcium: 9.3 mg/dL (ref 8.4–10.5)
Chloride: 98 mEq/L (ref 96–112)
GFR calc non Af Amer: 45 mL/min — ABNORMAL LOW (ref >90)
Glucose, Bld: 274 mg/dL — ABNORMAL HIGH (ref 70–99)
Sodium: 131 mEq/L — ABNORMAL LOW (ref 135–145)

## 2013-10-29 LAB — CBC WITH DIFFERENTIAL/PLATELET
Basophils Relative: 0 % (ref 0–1)
Eosinophils Absolute: 0.2 10*3/uL (ref 0.0–0.7)
Eosinophils Relative: 2 % (ref 0–5)
HCT: 37.6 % — ABNORMAL LOW (ref 39.0–52.0)
Hemoglobin: 12.1 g/dL — ABNORMAL LOW (ref 13.0–17.0)
Lymphocytes Relative: 15 % (ref 12–46)
Lymphs Abs: 1.1 10*3/uL (ref 0.7–4.0)
MCV: 88.7 fL (ref 78.0–100.0)
Monocytes Relative: 9 % (ref 3–12)
Platelets: 238 10*3/uL (ref 150–400)
RBC: 4.24 MIL/uL (ref 4.22–5.81)
WBC: 7.8 10*3/uL (ref 4.0–10.5)

## 2013-10-29 LAB — PRO B NATRIURETIC PEPTIDE: Pro B Natriuretic peptide (BNP): 3874 pg/mL — ABNORMAL HIGH (ref 0–125)

## 2013-10-29 MED ORDER — SODIUM CHLORIDE 0.9 % IV BOLUS (SEPSIS)
500.0000 mL | Freq: Once | INTRAVENOUS | Status: AC
  Administered 2013-10-29: 500 mL via INTRAVENOUS

## 2013-10-29 NOTE — ED Notes (Signed)
Pt up and ambulatory without difficulty.  Pt states he feels okay up and wants to go home.

## 2013-10-29 NOTE — ED Provider Notes (Signed)
CSN: [id number]     Arrival date & time 10/29/13  1537 History   First MD Initiated Contact with Patient 10/29/13 1616    Scribed for Geoffery Lyons, MD, the patient was seen in room APA05/APA05. This chart was scribed by Lewanda Rife, ED scribe. Patient's care was started at 4:22 PM  Chief Complaint  Patient presents with  . Weakness   (Consider location/radiation/quality/duration/timing/severity/associated sxs/prior Treatment) The history is provided by the patient and medical records. No language interpreter was used.   HPI Comments: [name] is a 57 y.o. male who presents to the Emergency Department complaining of constant generalized moderate weakness onset this morning. Reports he went to PCP Dr. [name] today with low blood pressure and sent to ED for further evaluation. Reports associated moderate polydypsia. Reports weakness is exacerbated when trying to sit up and alleviated by nothing. Denies associated any pain, fever, chest pain, emesis, diarrhea, and abdominal pain. Reports his amiodarone, and spironolactone dose was increased recently. States he was admitted last week for atrial fibrillation. Reports PMHx of MI in 2001.  Past Medical History  Diagnosis Date  . Essential hypertension, benign   . Type 2 diabetes mellitus   . Coronary atherosclerosis of native coronary artery     BMS to LAD 2001 at [facility], PTCA/atherectomy ramus and BMS to LAD 2009  . S/P colonoscopy     Normal via ostomy - September 2009  . Chronic systolic heart failure   . Myocardial infarction, anterior wall     Treated with tPA at [facility] 2000  . GERD (gastroesophageal reflux disease)   . Cardiomyopathy, ischemic     BIV ICD St. Jude, LVEF 23%  . Paroxysmal atrial fibrillation     Amiodarone and Coumadin  . 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 Prostate cancer    Past Surgical History  Procedure Laterality Date  . Internal defibrillator and pacemaker  2010    x2 St. Jude device  .  Abdominal and perineal resection of rectum with total mesorectal excision      10/04/2007  . Colonoscopy      05/17/2007. IMPRESSION: Semilunar, apple-core neoplasm low in the rectum (palpable on digital rectal exam) beginning at 5 cm and corkscrewing up 5 cm in length. This was a low rectal lesion consistent with colorectal carcinoma. It was biosied multiple times. The upstream colon all the way to the cecum appeared normal. Recommendations: Followup on path. Surgical Consultation   . Colonoscopy  09/14/2011    Procedure: COLONOSCOPY;  Surgeon: Corbin Ade, MD;  Location: [facility];  Service: Endoscopy;  Laterality: N/A;  8:30- TCS via colostomy & pt has defibrillator  . Colostomy     Family History  Problem Relation Age of Onset  . Colon cancer Mother 16  . Colon cancer Sister 81  . Coronary artery disease Father    History  Substance Use Topics  . Smoking status: Never Smoker   . Smokeless tobacco: Never Used  . Alcohol Use: No     Comment: Former user    Review of Systems  Constitutional: Negative for fever.  Neurological: Positive for weakness.  All other systems reviewed and are negative.   A complete 10 system review of systems was obtained and all systems are negative except as noted in the HPI and PMHx.    Allergies  Review of patient's allergies indicates no known allergies.  Home Medications   Current Outpatient Rx  Name  Route  Sig  Dispense  Refill  . amiodarone (PACERONE) 200 MG tablet   Oral   Take 1 tablet (200 mg total) by mouth 2 (two) times daily.   60 tablet   1   . aspirin 81 MG EC tablet   Oral   Take 81 mg by mouth at bedtime.          . citalopram (CELEXA) 10 MG tablet   Oral   Take 10 mg by mouth every morning.          Marland Kitchen co-enzyme Q-10 50 MG capsule   Oral   Take 50 mg by mouth every morning.          Marland Kitchen COUMADIN 2.5 MG tablet   Oral   Take 2.5 mg by mouth every evening.          Marland Kitchen CRESTOR 20 MG tablet   Oral   Take  20 mg by mouth at bedtime.          . Digoxin 187.5 MCG TABS   Oral   Take 187.5 mg by mouth every other day. Takes in the morning         . enalapril (VASOTEC) 10 MG tablet   Oral   Take 10 mg by mouth 2 (two) times daily.          . furosemide (LASIX) 20 MG tablet   Oral   Take 20-40 mg by mouth every morning.         . insulin aspart (NOVOLOG) 100 UNIT/ML injection   Subcutaneous   Inject 12-14 Units into the skin 3 (three) times daily before meals. 14 units at breakfast and 12 units a lunch and 12 units at supper per sliding scale          . insulin glargine (LANTUS) 100 UNIT/ML injection   Subcutaneous   Inject 24 Units into the skin at bedtime.           . isosorbide mononitrate (IMDUR) 30 MG 24 hr tablet   Oral   Take 15 mg by mouth 2 (two) times daily.         . metFORMIN (GLUCOPHAGE) 500 MG tablet   Oral   Take 500 mg by mouth 2 (two) times daily with a meal.         . metoprolol (TOPROL-XL) 200 MG 24 hr tablet   Oral   Take 200 mg by mouth every morning.          . niacin (NIASPAN) 1000 MG CR tablet   Oral   Take 1,000 mg by mouth at bedtime.           . Omega-3 Fatty Acids (FISH OIL) 1200 MG CAPS   Oral   Take 1,200 mg by mouth 2 (two) times daily.           . pantoprazole (PROTONIX) 40 MG tablet   Oral   Take 40 mg by mouth Daily.          . ranolazine (RANEXA) 1000 MG SR tablet   Oral   Take 1,000 mg by mouth 2 (two) times daily.         Marland Kitchen spironolactone (ALDACTONE) 25 MG tablet   Oral   Take 1 tablet (25 mg total) by mouth every morning.   30 tablet   1   . nitroGLYCERIN (NITROLINGUAL) 0.4 MG/SPRAY spray   Sublingual   Place 1 spray under the tongue every 5 (five) minutes as needed. angina  BP 98/65  Pulse 70  Temp(Src) 97.5 F (36.4 C) (Oral)  Resp 20  Ht 5\' 10"  (1.778 m)  Wt 224 lb (101.606 kg)  BMI 32.14 kg/m2  SpO2 97% Physical Exam  Nursing note and vitals reviewed. Constitutional: He is  oriented to person, place, and time. He appears well-developed and well-nourished. No distress.  HENT:  Head: Normocephalic and atraumatic.  Eyes: EOM are normal.  Neck: Neck supple. No tracheal deviation present.  Cardiovascular: Normal rate.   Pulmonary/Chest: Effort normal. No respiratory distress.  Musculoskeletal: Normal range of motion. He exhibits no edema.  Neurological: He is alert and oriented to person, place, and time.  Skin: Skin is warm and dry.  Psychiatric: He has a normal mood and affect. His behavior is normal.    ED Course  Procedures (including critical care time)  COORDINATION OF CARE:  Nursing notes reviewed. Vital signs reviewed. Initial pt interview and examination performed.   4:28 PM-Discussed work up plan with pt at bedside, which includes CBC with diff panel, BMP, EKG, Troponin, and CXR. Pt agrees with plan.  6:25 PM Nursing Notes Reviewed/ Care Coordinated Applicable Imaging Reviewed  Interpretation of Laboratory Data incorporated into ED treatment Discussed results and treatment plan with pt. Pt demonstrates understanding and agrees with plan.    Treatment plan initiated: Medications  sodium chloride 0.9 % bolus 500 mL (0 mLs Intravenous Stopped 10/29/13 1731)     Initial diagnostic testing ordered.    Labs Review Labs Reviewed  CBC WITH DIFFERENTIAL - Abnormal; Notable for the following:    Hemoglobin 12.1 (*)    HCT 37.6 (*)    All other components within normal limits  BASIC METABOLIC PANEL - Abnormal; Notable for the following:    Sodium 131 (*)    Glucose, Bld 274 (*)    BUN 29 (*)    Creatinine, Ser 1.62 (*)    GFR calc non Af Amer 45 (*)    GFR calc Af Amer 52 (*)    All other components within normal limits  PRO B NATRIURETIC PEPTIDE - Abnormal; Notable for the following:    Pro B Natriuretic peptide (BNP) 3874.0 (*)    All other components within normal limits  TROPONIN I   Imaging Review Dg Chest Port 1  View  10/29/2013   CLINICAL DATA:  Weakness and hypotension.  EXAM: PORTABLE CHEST - 1 VIEW  COMPARISON:  October 23, 2013.  FINDINGS: Since the previous study the pulmonary interstitium has become less congested appearing. The cardiac silhouette remains enlarged. The pulmonary vascularity is less engorged. The permanent pacemaker defibrillator appears unchanged. There is no pleural effusion.  IMPRESSION: There has been improvement in the appearance of the pulmonary vascularity and pulmonary interstitium since the earlier study. Low-grade CHF likely persists. There is no evidence of pneumonia.   Electronically Signed   By: David  [place]   On: 10/29/2013 16:47    EKG Interpretation    Date/Time:  Monday October 29 2013 15:55:57 EST Ventricular Rate:  70 PR Interval:    QRS Duration: 162 QT Interval:  456 QTC Calculation: 492 R Axis:   -156 Text Interpretation:  Ventricular-paced rhythm Biventricular pacemaker detected Abnormal ECG When compared with ECG of 25-Oct-2013 05:04, Electronic ventricular pacemaker has replaced Atrial flutter Confirmed by Malva Cogan  MD, Aalliyah Kilker ([id number]) on 10/29/2013 7:00:39 PM            MDM  No diagnosis found. Patient is a 57 year old male with extensive past medical history  including diabetes, ischemic cardiomyopathy, coronary artery disease with history of bypass. He was recently hospitalized for atrial fib with RVR and CHF exacerbation. His amiodarone was increased as was his fluid medication. He was seen at the doctor's office for routine followup and was found to have low blood pressure. He felt weak at that time and was sent here for evaluation. Workup reveals essentially unchanged laboratory studies. Mr. Glean Salen is negative and BNP is consistent with baseline. This x-ray reveals improvement in pulmonary vasculature from prior studies. He was given 500 cc of normal saline while in the ED and his blood pressures have remained approximately 100 systolic. He states  that this is his baseline. He was ambulated in the department and did not become symptomatic. His blood pressure was rechecked and was unchanged after ambulation. He prefers to not be admitted if at all possible. I feel as though this is appropriate, so long as he assures me he will follow up with his doctor in the next 2-3 days and promises to return to the ER for symptoms worsen, change, or he develops new or other concerning symptoms.  I personally performed the services described in this documentation, which was scribed in my presence. The recorded information has been reviewed and is accurate.      Geoffery Lyons, MD 10/29/13 [phone]

## 2013-10-29 NOTE — ED Notes (Signed)
Feels weak, dizzy, Went to Dr Fusco's office and bp was low.  Adm last week for heart problem.

## 2013-10-30 ENCOUNTER — Other Ambulatory Visit: Payer: Self-pay | Admitting: Cardiovascular Disease

## 2013-10-31 ENCOUNTER — Other Ambulatory Visit: Payer: Self-pay | Admitting: Cardiovascular Disease

## 2013-11-08 ENCOUNTER — Ambulatory Visit (INDEPENDENT_AMBULATORY_CARE_PROVIDER_SITE_OTHER): Payer: Medicare Other | Admitting: *Deleted

## 2013-11-08 ENCOUNTER — Encounter: Payer: Self-pay | Admitting: Cardiovascular Disease

## 2013-11-08 ENCOUNTER — Ambulatory Visit (INDEPENDENT_AMBULATORY_CARE_PROVIDER_SITE_OTHER): Payer: Medicare Other | Admitting: Cardiovascular Disease

## 2013-11-08 ENCOUNTER — Ambulatory Visit (INDEPENDENT_AMBULATORY_CARE_PROVIDER_SITE_OTHER): Payer: Medicare Other | Admitting: Pharmacist Clinician (PhC)/ Clinical Pharmacy Specialist

## 2013-11-08 VITALS — BP 80/60 | HR 65 | Ht 70.0 in | Wt 224.5 lb

## 2013-11-08 DIAGNOSIS — I4892 Unspecified atrial flutter: Secondary | ICD-10-CM

## 2013-11-08 DIAGNOSIS — I10 Essential (primary) hypertension: Secondary | ICD-10-CM | POA: Diagnosis not present

## 2013-11-08 DIAGNOSIS — I4891 Unspecified atrial fibrillation: Secondary | ICD-10-CM

## 2013-11-08 DIAGNOSIS — I2589 Other forms of chronic ischemic heart disease: Secondary | ICD-10-CM

## 2013-11-08 DIAGNOSIS — I5022 Chronic systolic (congestive) heart failure: Secondary | ICD-10-CM | POA: Diagnosis not present

## 2013-11-08 DIAGNOSIS — Z7901 Long term (current) use of anticoagulants: Secondary | ICD-10-CM | POA: Diagnosis not present

## 2013-11-08 DIAGNOSIS — E785 Hyperlipidemia, unspecified: Secondary | ICD-10-CM

## 2013-11-08 DIAGNOSIS — I219 Acute myocardial infarction, unspecified: Secondary | ICD-10-CM | POA: Diagnosis not present

## 2013-11-08 DIAGNOSIS — I480 Paroxysmal atrial fibrillation: Secondary | ICD-10-CM

## 2013-11-08 LAB — MDC_IDC_ENUM_SESS_TYPE_INCLINIC
Battery Remaining Longevity: 3.4
Battery Voltage: 2.57 V
Brady Statistic RA Percent Paced: 39 %
Brady Statistic RV Percent Paced: 68 %
Implantable Pulse Generator Serial Number: 668098
Lead Channel Impedance Value: 410 Ohm
Lead Channel Impedance Value: 680 Ohm
Lead Channel Pacing Threshold Amplitude: 0.5 V
Lead Channel Pacing Threshold Amplitude: 1 V
Lead Channel Pacing Threshold Pulse Width: 0.5 ms
Lead Channel Sensing Intrinsic Amplitude: 6.2 mV
Lead Channel Setting Pacing Amplitude: 2 V
Lead Channel Setting Pacing Pulse Width: 0.5 ms
Zone Setting Detection Interval: 320.86 ms

## 2013-11-08 LAB — ICD DEVICE OBSERVATION

## 2013-11-08 NOTE — Progress Notes (Signed)
11/08/2013 [name]   September 15, 1956  [id number]  Primary Physician [name]., MD Primary Cardiologist: Robert Gess MD [name]   HPI:  57 year old Caucasian male patient of Dr. Alanda [name] with a history of remote anterior myocardial infarction in 2002 with stenting of his LAD. He has severe ischemic cardiomyopathy.he's had a Bybee ICD placed. He underwent cardiac catheterization 2009 revealing a patent stent to his LAD and severely diseased diagonal and obtuse marginal branches. He has paroxysmal [place]jo fibrillation on Coumadin anticoagulation. He was admitted several days ago a chest pain and shortness of breath. He ruled out for myocardial infarction. It was decided to proceed to cardiac catheterization to define his anatomy. His Coumadin was held and his INR drifted down to 2. I performed cardiac catheterization on him on 07/13/13 via the right radial approach revealing a patent LAD stent, severe LV dysfunction with otherwise insignificant CAD. His father Dr. Elayne [name] in [facility] and has seen Dr. Teressa [name] here in [place].he is aware of salt restriction and denies chest pain or shortness of breath   Current Outpatient Prescriptions  Medication Sig Dispense Refill  . amiodarone (PACERONE) 200 MG tablet Take 1 tablet (200 mg total) by mouth 2 (two) times daily.  60 tablet  1  . aspirin 81 MG EC tablet Take 81 mg by mouth at bedtime.       . citalopram (CELEXA) 20 MG tablet Take 20 mg by mouth daily.      Marland Kitchen co-enzyme Q-10 50 MG capsule Take 50 mg by mouth every morning.       Marland Kitchen COUMADIN 2.5 MG tablet Take 2.5 mg by mouth every evening.       Marland Kitchen CRESTOR 20 MG tablet Take 20 mg by mouth at bedtime.       . digoxin (LANOXIN) 0.125 MG tablet Take 0.125 mg by mouth daily.      . enalapril (VASOTEC) 10 MG tablet Take 10 mg by mouth 2 (two) times daily.       . furosemide (LASIX) 20 MG tablet Take 20 mg by mouth every morning.       . insulin aspart (NOVOLOG) 100  UNIT/ML injection Inject 12-14 Units into the skin 3 (three) times daily before meals. 14 units at breakfast and 12 units a lunch and 12 units at supper per sliding scale       . insulin glargine (LANTUS) 100 UNIT/ML injection Inject 24 Units into the skin at bedtime.        . isosorbide mononitrate (IMDUR) 30 MG 24 hr tablet Take 15 mg by mouth 2 (two) times daily.      . metFORMIN (GLUCOPHAGE) 500 MG tablet Take 500 mg by mouth 2 (two) times daily with a meal.      . metoprolol (TOPROL-XL) 200 MG 24 hr tablet take 1 tablet once daily  30 tablet  5  . niacin (NIASPAN) 1000 MG CR tablet Take 1,000 mg by mouth at bedtime.        . nitroGLYCERIN (NITROLINGUAL) 0.4 MG/SPRAY spray Place 1 spray under the tongue every 5 (five) minutes as needed. angina      . Omega-3 Fatty Acids (FISH OIL) 1200 MG CAPS Take 1,200 mg by mouth 2 (two) times daily.        . pantoprazole (PROTONIX) 40 MG tablet Take 40 mg by mouth Daily.       . ranolazine (RANEXA) 1000 MG SR tablet Take 1,000 mg by mouth 2 (  two) times daily.      Marland Kitchen spironolactone (ALDACTONE) 25 MG tablet Take 12.5 mg by mouth every morning.       No current facility-administered medications for this visit.    No Known Allergies  History   Social History  . Marital Status: Married    Spouse Name: N/A    Number of Children: 1  . Years of Education: N/A   Occupational History  .     Social History Main Topics  . Smoking status: Never Smoker   . Smokeless tobacco: Never Used  . Alcohol Use: No     Comment: Former user  . Drug Use: No  . Sexual Activity: Not on file   Other Topics Concern  . Not on file   Social History Narrative  . No narrative on file     Review of Systems: General: negative for chills, fever, night sweats or weight changes.  Cardiovascular: negative for chest pain, dyspnea on exertion, edema, orthopnea, palpitations, paroxysmal nocturnal dyspnea or shortness of breath Dermatological: negative for  rash Respiratory: negative for cough or wheezing Urologic: negative for hematuria Abdominal: negative for nausea, vomiting, diarrhea, bright red blood per rectum, melena, or hematemesis Neurologic: negative for visual changes, syncope, or dizziness All other systems reviewed and are otherwise negative except as noted above.    Blood pressure 80/60, pulse 65, height 5\' 10"  (1.778 m), weight 224 lb 8 oz (101.833 kg).  General appearance: alert and no distress Neck: no adenopathy, no carotid bruit, no JVD, supple, symmetrical, trachea midline and thyroid not enlarged, symmetric, no tenderness/mass/nodules Lungs: clear to auscultation bilaterally Heart: regular rate and rhythm, S1, S2 normal, no murmur, click, rub or gallop Extremities: extremities normal, atraumatic, no cyanosis or edema  EKG AV pacing  ASSESSMENT AND PLAN:   CARDIOMYOPATHY, ISCHEMIC, with BiV ICD, st Jude EF 23% Patient has a history of ischemic coronary myopathy. In the interval myocardial infarctionin 2002 with stenting of the LAD. His EF is a bit in the 25% range. He has a bi-V. ICD placed by Dr. Graciela [name] in the past. He's had admissions for congestive heart failure. He denies chest pain. I performed cardiac catheterization on him 07/13/13 revealed a patent LAD stent, segmental disease in his diagonal branch which was non-revascularizable. His RCA was nondominant. Medical therapy was recommended.  Atrial fibrillation with RVR His A. Fib burden by interrogation of this by the ICD today was over 50%. He is on Coumadin anticoagulation  HYPERTENSION Controlled on current medications  Dyslipidemia- (LDL 160, previously in the 50s) On statin therapy with recent lipid profile performed 08/02/13 revealed a total social 182, LDL of 121 and HDL of 182      Robert Gess MD [facility], [facility] 11/08/2013 5:10 PM

## 2013-11-08 NOTE — Assessment & Plan Note (Signed)
Controlled on current medications 

## 2013-11-08 NOTE — Assessment & Plan Note (Signed)
Patient has a history of ischemic coronary myopathy. In the interval myocardial infarctionin 2002 with stenting of the LAD. His EF is a bit in the 25% range. He has a bi-V. ICD placed by Dr. Graciela Husbands in the past. He's had admissions for congestive heart failure. He denies chest pain. I performed cardiac catheterization on him 07/13/13 revealed a patent LAD stent, segmental disease in his diagonal branch which was non-revascularizable. His RCA was nondominant. Medical therapy was recommended.

## 2013-11-08 NOTE — Patient Instructions (Signed)
Your physician wants you to follow-up in: 6 months with an extender and 1 year with Dr Allyson Sabal. You will receive a reminder letter in the mail two months in advance. If you don't receive a letter, please call our office to schedule the follow-up appointment.  Dr Ladona Ridgel will follow up with you in 3 months in [place]. (February 11, 2014 at 10:45am)

## 2013-11-08 NOTE — Assessment & Plan Note (Signed)
On statin therapy with recent lipid profile performed 08/02/13 revealed a total social 182, LDL of 121 and HDL of 182

## 2013-11-08 NOTE — Assessment & Plan Note (Signed)
His A. Fib burden by interrogation of this by the ICD today was over 50%. He is on Coumadin anticoagulation

## 2013-11-09 ENCOUNTER — Encounter: Payer: Self-pay | Admitting: Cardiovascular Disease

## 2013-11-17 NOTE — Progress Notes (Signed)
CRT-D device check in office with Dr.Berry. Thresholds and sensing consistent with previous device measurements. Lead impedance trends stable over time. 10053 AF episodes recorded (59%)---max dur. >1 day, Max A 591, last 11-29 + Warfarin. No ventricular arrhythmia episodes recorded. Patient bi-ventricularly pacing 68% of the time. Patient without symptoms. Device programmed with appropriate safety margins. No changes made this session. Estimated longevity 3.4 years.  Patient will follow up with GT/R in 3 months. Patient education completed including shock plan.

## 2013-11-20 ENCOUNTER — Other Ambulatory Visit: Payer: Self-pay | Admitting: *Deleted

## 2013-11-20 MED ORDER — PANTOPRAZOLE SODIUM 40 MG PO TBEC: 40.0000 mg | DELAYED_RELEASE_TABLET | Freq: Every day | ORAL | Status: DC

## 2013-11-21 DIAGNOSIS — Z6833 Body mass index (BMI) 33.0-33.9, adult: Secondary | ICD-10-CM | POA: Diagnosis not present

## 2013-11-21 DIAGNOSIS — H66009 Acute suppurative otitis media without spontaneous rupture of ear drum, unspecified ear: Secondary | ICD-10-CM | POA: Diagnosis not present

## 2013-11-21 DIAGNOSIS — H6980 Other specified disorders of Eustachian tube, unspecified ear: Secondary | ICD-10-CM | POA: Diagnosis not present

## 2013-11-22 ENCOUNTER — Other Ambulatory Visit: Payer: Self-pay

## 2013-11-22 MED ORDER — CITALOPRAM HYDROBROMIDE 20 MG PO TABS: 20.0000 mg | ORAL_TABLET | Freq: Every day | ORAL | Status: DC

## 2013-11-22 NOTE — Telephone Encounter (Signed)
Rx was sent to pharmacy electronically. 

## 2013-11-24 ENCOUNTER — Emergency Department (HOSPITAL_COMMUNITY)
Admission: EM | Admit: 2013-11-24 | Discharge: 2013-11-24 | Disposition: A | Discharge status: Home / Self Care | Payer: Medicare Other | Attending: Emergency Medicine | Admitting: Emergency Medicine

## 2013-11-24 ENCOUNTER — Encounter (HOSPITAL_COMMUNITY): Payer: Self-pay | Admitting: Emergency Medicine

## 2013-11-24 ENCOUNTER — Emergency Department (HOSPITAL_COMMUNITY): Payer: Medicare Other

## 2013-11-24 DIAGNOSIS — I10 Essential (primary) hypertension: Secondary | ICD-10-CM | POA: Insufficient documentation

## 2013-11-24 DIAGNOSIS — Z79899 Other long term (current) drug therapy: Secondary | ICD-10-CM | POA: Diagnosis not present

## 2013-11-24 DIAGNOSIS — M546 Pain in thoracic spine: Secondary | ICD-10-CM | POA: Diagnosis not present

## 2013-11-24 DIAGNOSIS — I252 Old myocardial infarction: Secondary | ICD-10-CM | POA: Diagnosis not present

## 2013-11-24 DIAGNOSIS — Z794 Long term (current) use of insulin: Secondary | ICD-10-CM | POA: Insufficient documentation

## 2013-11-24 DIAGNOSIS — R0602 Shortness of breath: Secondary | ICD-10-CM | POA: Diagnosis not present

## 2013-11-24 DIAGNOSIS — W1789XA Other fall from one level to another, initial encounter: Secondary | ICD-10-CM | POA: Insufficient documentation

## 2013-11-24 DIAGNOSIS — Z7982 Long term (current) use of aspirin: Secondary | ICD-10-CM | POA: Insufficient documentation

## 2013-11-24 DIAGNOSIS — Z85048 Personal history of other malignant neoplasm of rectum, rectosigmoid junction, and anus: Secondary | ICD-10-CM | POA: Insufficient documentation

## 2013-11-24 DIAGNOSIS — Z95810 Presence of automatic (implantable) cardiac defibrillator: Secondary | ICD-10-CM | POA: Diagnosis not present

## 2013-11-24 DIAGNOSIS — I4891 Unspecified atrial fibrillation: Secondary | ICD-10-CM | POA: Insufficient documentation

## 2013-11-24 DIAGNOSIS — I2510 Atherosclerotic heart disease of native coronary artery without angina pectoris: Secondary | ICD-10-CM | POA: Diagnosis not present

## 2013-11-24 DIAGNOSIS — Z933 Colostomy status: Secondary | ICD-10-CM | POA: Insufficient documentation

## 2013-11-24 DIAGNOSIS — Y929 Unspecified place or not applicable: Secondary | ICD-10-CM | POA: Insufficient documentation

## 2013-11-24 DIAGNOSIS — M549 Dorsalgia, unspecified: Secondary | ICD-10-CM | POA: Diagnosis not present

## 2013-11-24 DIAGNOSIS — E119 Type 2 diabetes mellitus without complications: Secondary | ICD-10-CM | POA: Insufficient documentation

## 2013-11-24 DIAGNOSIS — Y939 Activity, unspecified: Secondary | ICD-10-CM | POA: Insufficient documentation

## 2013-11-24 DIAGNOSIS — Z8546 Personal history of malignant neoplasm of prostate: Secondary | ICD-10-CM | POA: Diagnosis not present

## 2013-11-24 DIAGNOSIS — Z9889 Other specified postprocedural states: Secondary | ICD-10-CM | POA: Insufficient documentation

## 2013-11-24 DIAGNOSIS — K219 Gastro-esophageal reflux disease without esophagitis: Secondary | ICD-10-CM | POA: Diagnosis not present

## 2013-11-24 DIAGNOSIS — I5022 Chronic systolic (congestive) heart failure: Secondary | ICD-10-CM | POA: Diagnosis not present

## 2013-11-24 DIAGNOSIS — IMO0002 Reserved for concepts with insufficient information to code with codable children: Secondary | ICD-10-CM | POA: Diagnosis not present

## 2013-11-24 MED ORDER — OXYCODONE-ACETAMINOPHEN 5-325 MG PO TABS
2.0000 | ORAL_TABLET | Freq: Once | ORAL | Status: AC
  Administered 2013-11-24: 2 via ORAL
  Filled 2013-11-24: qty 2

## 2013-11-24 MED ORDER — OXYCODONE-ACETAMINOPHEN 5-325 MG PO TABS: 1.0000 | ORAL_TABLET | ORAL | Status: DC | PRN

## 2013-11-24 NOTE — ED Notes (Signed)
Pt. Reports falling from deer stand an hour and a half ago. Pt. States he fell 2-43ft and landed on back. Pt. Denies loss of consciousness and denies dizziness. Pt. Complaining of central chest pain.

## 2013-11-24 NOTE — Discharge Instructions (Signed)
Xray shows no fracture.   Pain meds.  Rest.  You will be sore for several days.

## 2013-11-24 NOTE — ED Provider Notes (Signed)
CSN: [id number]     Arrival date & time 11/24/13  1435 History  This chart was scribed for Donnetta Hutching, MD by Bennett Scrape, ED Scribe. This patient was seen in room APA18/APA18 and the patient's care was started at 2:48 PM.    Chief Complaint  Patient presents with  . Chest Pain    The history is provided by the patient. No language interpreter was used.    HPI Comments: JOEVON HOLLIMAN is a 57 y.o. male who presents to the Emergency Department complaining of a fall from a deer stand about 1.5 hours ago. He states that he missed a step and fell 2-3 ft to the ground landing on his back. He states that he landed on his gun but denies that it fired. He states that he twisted suddenly to try to get up and felt something pull in his chest. He c/o upper back pain and central CP currently. He denies LOC. He denies any other symptoms.    Past Medical History  Diagnosis Date  . Essential hypertension, benign   . Type 2 diabetes mellitus   . Coronary atherosclerosis of native coronary artery     BMS to LAD 2001 at [facility], PTCA/atherectomy ramus and BMS to LAD 2009  . S/P colonoscopy     Normal via ostomy - September 2009  . Chronic systolic heart failure   . Myocardial infarction, anterior wall     Treated with tPA at [facility] 2000  . GERD (gastroesophageal reflux disease)   . Cardiomyopathy, ischemic     BIV ICD St. Jude, LVEF 23%  . Paroxysmal atrial fibrillation     Amiodarone and Coumadin  . 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 Prostate cancer   . 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 Internal defibrillator and pacemaker  2010    x2 St. Jude device  . Abdominal and perineal resection of rectum with total mesorectal excision      10/04/2007  . Colonoscopy      05/17/2007. IMPRESSION: Semilunar, apple-core neoplasm low in the rectum (palpable on digital rectal exam) beginning at 5 cm and corkscrewing up  5 cm in length. This was a low rectal lesion consistent with colorectal carcinoma. It was biosied multiple times. The upstream colon all the way to the cecum appeared normal. Recommendations: Followup on path. Surgical Consultation   . Colonoscopy  09/14/2011    Procedure: COLONOSCOPY;  Surgeon: Corbin Ade, MD;  Location: [facility];  Service: Endoscopy;  Laterality: N/A;  8:30- TCS via colostomy & pt has defibrillator  . Colostomy     Family History  Problem Relation Age of Onset  . Colon cancer Mother 75  . Colon cancer Sister 29  . Coronary artery disease Father    History  Substance Use Topics  . Smoking status: Never Smoker   . Smokeless tobacco: Never Used  . Alcohol Use: No     Comment: Former user    Review of Systems  A complete 10 system review of systems was obtained and all systems are negative except as noted in the HPI and PMH.   Allergies  Review of patient's allergies indicates no known allergies.  Home Medications   Current Outpatient Rx  Name  Route  Sig  Dispense  Refill  . amiodarone (PACERONE) 200 MG tablet   Oral   Take 1 tablet (200 mg total) by  mouth 2 (two) times daily.   60 tablet   1   . aspirin 81 MG EC tablet   Oral   Take 81 mg by mouth at bedtime.          . citalopram (CELEXA) 20 MG tablet   Oral   Take 1 tablet (20 mg total) by mouth daily.   30 tablet   2   . co-enzyme Q-10 50 MG capsule   Oral   Take 50 mg by mouth every morning.          Marland Kitchen COUMADIN 2.5 MG tablet   Oral   Take 2.5 mg by mouth every evening.          Marland Kitchen CRESTOR 20 MG tablet   Oral   Take 20 mg by mouth at bedtime.          . digoxin (LANOXIN) 0.125 MG tablet   Oral   Take 0.125 mg by mouth daily.         . enalapril (VASOTEC) 10 MG tablet   Oral   Take 10 mg by mouth 2 (two) times daily.          . furosemide (LASIX) 20 MG tablet   Oral   Take 20 mg by mouth every morning.          . insulin aspart (NOVOLOG) 100 UNIT/ML  injection   Subcutaneous   Inject 12-14 Units into the skin 3 (three) times daily before meals. 14 units at breakfast and 12 units a lunch and 12 units at supper per sliding scale          . insulin glargine (LANTUS) 100 UNIT/ML injection   Subcutaneous   Inject 24 Units into the skin at bedtime.           . isosorbide mononitrate (IMDUR) 30 MG 24 hr tablet   Oral   Take 15 mg by mouth 2 (two) times daily.         . metFORMIN (GLUCOPHAGE) 500 MG tablet   Oral   Take 500 mg by mouth 2 (two) times daily with a meal.         . metoprolol (TOPROL-XL) 200 MG 24 hr tablet      take 1 tablet once daily   30 tablet   5   . niacin (NIASPAN) 1000 MG CR tablet   Oral   Take 1,000 mg by mouth at bedtime.           . nitroGLYCERIN (NITROLINGUAL) 0.4 MG/SPRAY spray   Sublingual   Place 1 spray under the tongue every 5 (five) minutes as needed. angina         . Omega-3 Fatty Acids (FISH OIL) 1200 MG CAPS   Oral   Take 1,200 mg by mouth 2 (two) times daily.           . pantoprazole (PROTONIX) 40 MG tablet   Oral   Take 1 tablet (40 mg total) by mouth daily.   30 tablet   6   . ranolazine (RANEXA) 1000 MG SR tablet   Oral   Take 1,000 mg by mouth 2 (two) times daily.         Marland Kitchen spironolactone (ALDACTONE) 25 MG tablet   Oral   Take 12.5 mg by mouth every morning.          Triage Vitals: BP 122/73  Pulse 65  Temp(Src) 97.6 F (36.4 C) (Oral)  Resp 17  Ht 5\' 10"  (  1.778 m)  Wt 210 lb (95.255 kg)  BMI 30.13 kg/m2  SpO2 100%  Physical Exam  Nursing note and vitals reviewed. Constitutional: He is oriented to person, place, and time. He appears well-developed and well-nourished.  HENT:  Head: Normocephalic and atraumatic.  Eyes: Conjunctivae and EOM are normal. Pupils are equal, round, and reactive to light.  Neck: Normal range of motion. Neck supple.  Cardiovascular: Normal rate, regular rhythm and normal heart sounds.   Pulmonary/Chest: Effort normal and  breath sounds normal.  Defibrillator under left anterior chest wall. Does not appear out of alignment   Abdominal: Soft. Bowel sounds are normal. There is no tenderness.  Colostomy to left abdomen  Musculoskeletal: Normal range of motion.  Tenderness in the T3 area. No extremity tenderness. Pelvis is stable and non-tender.   Neurological: He is alert and oriented to person, place, and time.  Skin: Skin is warm and dry.  Psychiatric: He has a normal mood and affect. His behavior is normal.    ED Course  Procedures (including critical care time)  DIAGNOSTIC STUDIES: Oxygen Saturation is 100% on room air, normal by my interpretation.    COORDINATION OF CARE: 2:52 PM-Discussed treatment plan which includes CXR with pt at bedside and pt agreed to plan.   Labs Review Labs Reviewed - No data to display Imaging Review Dg Thoracic Spine 4v  11/24/2013   CLINICAL DATA:  Fall from tree.  Upper back pain.  EXAM: THORACIC SPINE - 4+ VIEW  COMPARISON:  Two-view CHEST x-ray 07/30/2013  FINDINGS: Degenerative changes and spurring throughout the thoracic spine. Normal alignment. No fracture.  IMPRESSION: Degenerative changes.  No acute findings.   Electronically Signed   By: Charlett Nose M.D.   On: 11/24/2013 15:32    EKG Interpretation    Date/Time:  Saturday November 24 2013 14:48:30 EST Ventricular Rate:  65 PR Interval:  160 QRS Duration: 210 QT Interval:  540 QTC Calculation: 561 R Axis:   -147 Text Interpretation:  AV dual-paced rhythm Biventricular pacemaker detected Abnormal ECG When compared with ECG of 29-Oct-2013 15:55, Vent. rate has decreased BY   5 BPM Confirmed by Ahmira Boisselle  MD, Saliha Salts ([id number]) on 11/24/2013 3:24:27 PM            MDM  No diagnosis found. Patient is most tender in his upper thoracic spine. Thoracic spine films negative. Discharge medication Percocet.  Patient is hemodynamically stable   I personally performed the services described in this documentation, which  was scribed in my presence. The recorded information has been reviewed and is accurate.    Donnetta Hutching, MD 11/24/13 [phone]

## 2013-12-03 DIAGNOSIS — I509 Heart failure, unspecified: Secondary | ICD-10-CM | POA: Diagnosis not present

## 2013-12-03 DIAGNOSIS — I4891 Unspecified atrial fibrillation: Secondary | ICD-10-CM | POA: Diagnosis not present

## 2013-12-03 DIAGNOSIS — R0609 Other forms of dyspnea: Secondary | ICD-10-CM | POA: Diagnosis not present

## 2013-12-03 DIAGNOSIS — E785 Hyperlipidemia, unspecified: Secondary | ICD-10-CM | POA: Diagnosis not present

## 2013-12-03 DIAGNOSIS — I5022 Chronic systolic (congestive) heart failure: Secondary | ICD-10-CM | POA: Diagnosis not present

## 2013-12-03 DIAGNOSIS — Z79899 Other long term (current) drug therapy: Secondary | ICD-10-CM | POA: Diagnosis not present

## 2013-12-08 ENCOUNTER — Encounter (HOSPITAL_COMMUNITY): Payer: Self-pay | Admitting: Emergency Medicine

## 2013-12-08 ENCOUNTER — Emergency Department (HOSPITAL_COMMUNITY): Payer: Medicare Other

## 2013-12-08 ENCOUNTER — Emergency Department (HOSPITAL_COMMUNITY)
Admission: EM | Admit: 2013-12-08 | Discharge: 2013-12-08 | Disposition: A | Discharge status: Home / Self Care | Payer: Medicare Other | Source: Home / Self Care | Attending: Emergency Medicine | Admitting: Emergency Medicine

## 2013-12-08 DIAGNOSIS — Z95810 Presence of automatic (implantable) cardiac defibrillator: Secondary | ICD-10-CM

## 2013-12-08 DIAGNOSIS — K219 Gastro-esophageal reflux disease without esophagitis: Secondary | ICD-10-CM | POA: Insufficient documentation

## 2013-12-08 DIAGNOSIS — G479 Sleep disorder, unspecified: Secondary | ICD-10-CM | POA: Insufficient documentation

## 2013-12-08 DIAGNOSIS — R079 Chest pain, unspecified: Secondary | ICD-10-CM | POA: Diagnosis not present

## 2013-12-08 DIAGNOSIS — Z79899 Other long term (current) drug therapy: Secondary | ICD-10-CM | POA: Insufficient documentation

## 2013-12-08 DIAGNOSIS — Z8546 Personal history of malignant neoplasm of prostate: Secondary | ICD-10-CM

## 2013-12-08 DIAGNOSIS — I252 Old myocardial infarction: Secondary | ICD-10-CM | POA: Insufficient documentation

## 2013-12-08 DIAGNOSIS — R05 Cough: Secondary | ICD-10-CM

## 2013-12-08 DIAGNOSIS — IMO0002 Reserved for concepts with insufficient information to code with codable children: Secondary | ICD-10-CM | POA: Insufficient documentation

## 2013-12-08 DIAGNOSIS — I5023 Acute on chronic systolic (congestive) heart failure: Secondary | ICD-10-CM | POA: Insufficient documentation

## 2013-12-08 DIAGNOSIS — I2510 Atherosclerotic heart disease of native coronary artery without angina pectoris: Secondary | ICD-10-CM | POA: Insufficient documentation

## 2013-12-08 DIAGNOSIS — Z85048 Personal history of other malignant neoplasm of rectum, rectosigmoid junction, and anus: Secondary | ICD-10-CM

## 2013-12-08 DIAGNOSIS — Z794 Long term (current) use of insulin: Secondary | ICD-10-CM

## 2013-12-08 DIAGNOSIS — R110 Nausea: Secondary | ICD-10-CM

## 2013-12-08 DIAGNOSIS — Z933 Colostomy status: Secondary | ICD-10-CM | POA: Insufficient documentation

## 2013-12-08 DIAGNOSIS — I4891 Unspecified atrial fibrillation: Secondary | ICD-10-CM

## 2013-12-08 DIAGNOSIS — I10 Essential (primary) hypertension: Secondary | ICD-10-CM | POA: Insufficient documentation

## 2013-12-08 DIAGNOSIS — J988 Other specified respiratory disorders: Secondary | ICD-10-CM | POA: Diagnosis not present

## 2013-12-08 DIAGNOSIS — R059 Cough, unspecified: Secondary | ICD-10-CM | POA: Insufficient documentation

## 2013-12-08 DIAGNOSIS — Z7901 Long term (current) use of anticoagulants: Secondary | ICD-10-CM | POA: Insufficient documentation

## 2013-12-08 DIAGNOSIS — Z7982 Long term (current) use of aspirin: Secondary | ICD-10-CM

## 2013-12-08 DIAGNOSIS — E119 Type 2 diabetes mellitus without complications: Secondary | ICD-10-CM

## 2013-12-08 DIAGNOSIS — R42 Dizziness and giddiness: Secondary | ICD-10-CM

## 2013-12-08 DIAGNOSIS — R0602 Shortness of breath: Secondary | ICD-10-CM | POA: Diagnosis not present

## 2013-12-08 LAB — CBC WITH DIFFERENTIAL/PLATELET
BASOS ABS: 0 10*3/uL (ref 0.0–0.1)
BASOS PCT: 0 % (ref 0–1)
Eosinophils Absolute: 0 10*3/uL (ref 0.0–0.7)
Eosinophils Relative: 0 % (ref 0–5)
HEMATOCRIT: 41.2 % (ref 39.0–52.0)
Hemoglobin: 13.6 g/dL (ref 13.0–17.0)
LYMPHS PCT: 7 % — AB (ref 12–46)
Lymphs Abs: 1 10*3/uL (ref 0.7–4.0)
MCH: 28.6 pg (ref 26.0–34.0)
MCHC: 33 g/dL (ref 30.0–36.0)
MCV: 86.7 fL (ref 78.0–100.0)
MONO ABS: 0.9 10*3/uL (ref 0.1–1.0)
Monocytes Relative: 6 % (ref 3–12)
Neutro Abs: 12.3 10*3/uL — ABNORMAL HIGH (ref 1.7–7.7)
Neutrophils Relative %: 86 % — ABNORMAL HIGH (ref 43–77)
PLATELETS: 183 10*3/uL (ref 150–400)
RBC: 4.75 MIL/uL (ref 4.22–5.81)
RDW: 14.2 % (ref 11.5–15.5)
WBC: 14.2 10*3/uL — AB (ref 4.0–10.5)

## 2013-12-08 LAB — PROTIME-INR
INR: 1.68 — ABNORMAL HIGH (ref 0.00–1.49)
Prothrombin Time: 19.3 seconds — ABNORMAL HIGH (ref 11.6–15.2)

## 2013-12-08 LAB — BASIC METABOLIC PANEL
BUN: 30 mg/dL — ABNORMAL HIGH (ref 6–23)
CO2: 17 meq/L — AB (ref 19–32)
CREATININE: 1.62 mg/dL — AB (ref 0.50–1.35)
Calcium: 9.4 mg/dL (ref 8.4–10.5)
Chloride: 94 mEq/L — ABNORMAL LOW (ref 96–112)
GFR calc non Af Amer: 45 mL/min — ABNORMAL LOW (ref >90)
GFR, EST AFRICAN AMERICAN: 52 mL/min — AB (ref >90)
Glucose, Bld: 360 mg/dL — ABNORMAL HIGH (ref 70–99)
Potassium: 4.9 mEq/L (ref 3.7–5.3)
SODIUM: 132 meq/L — AB (ref 137–147)

## 2013-12-08 LAB — TROPONIN I

## 2013-12-08 LAB — DIGOXIN LEVEL: Digoxin Level: 0.4 ng/mL — ABNORMAL LOW (ref 0.8–2.0)

## 2013-12-08 LAB — PRO B NATRIURETIC PEPTIDE: PRO B NATRI PEPTIDE: 7155 pg/mL — AB (ref 0–125)

## 2013-12-08 MED ORDER — FUROSEMIDE 10 MG/ML IJ SOLN
40.0000 mg | Freq: Once | INTRAMUSCULAR | Status: AC
  Administered 2013-12-08: 40 mg via INTRAVENOUS
  Filled 2013-12-08: qty 4

## 2013-12-08 MED ORDER — FUROSEMIDE 20 MG PO TABS: 20.0000 mg | ORAL_TABLET | Freq: Two times a day (BID) | ORAL | Status: DC

## 2013-12-08 NOTE — ED Notes (Signed)
Lab in room to draw additional blood work for labs that were added on, pt updated on plan of care,

## 2013-12-08 NOTE — ED Notes (Signed)
Pt c/o sob and cp since 0100 this am. Pt reports some congestion. Also c/o nausea and dizziness upon waking x 1 month-dx with inner ear.

## 2013-12-08 NOTE — ED Notes (Signed)
Dr. Wentz at bedside. 

## 2013-12-08 NOTE — ED Notes (Signed)
Pt given ice water per his request, update given on plan of care, states that his breathing is better,

## 2013-12-08 NOTE — Discharge Instructions (Signed)
Heart Failure Heart failure means your heart has trouble pumping blood. This makes it hard for your body to work well. Heart failure is usually a long-term (chronic) condition. You must take good care of yourself and follow your doctor's treatment plan. HOME CARE  Take your heart medicine as told by your doctor.  Do not stop taking medicine unless your doctor tells you to.  Do not skip any dose of medicine.  Refill your medicines before they run out.  Take other medicines only as told by your doctor or pharmacist.  Stay active if told by your doctor. The elderly and people with severe heart failure should talk with a doctor about physical activity.  Eat heart healthy foods. Choose foods that are without trans fat and are low in saturated fat, cholesterol, and salt (sodium). This includes fresh or frozen fruits and vegetables, fish, lean meats, fat-free or low-fat dairy foods, whole grains, and high-fiber foods. Lentils and dried peas and beans (legumes) are also good choices.  Limit salt if told by your doctor.  Cook in a healthy way. Roast, grill, broil, bake, poach, steam, or stir-fry foods.  Limit fluids as told by your doctor.  Weigh yourself every morning. Do this after you pee (urinate) and before you eat breakfast. Write down your weight to give to your doctor.  Take your blood pressure and write it down if your doctor tell you to.  Ask your doctor how to check your pulse. Check your pulse as told.  Lose weight if told by your doctor.  Stop smoking or chewing tobacco. Do not use gum or patches that help you quit without your doctor's approval.  Schedule and go to doctor visits as told.  Nonpregnant women should have no more than 1 drink a day. Men should have no more than 2 drinks a day. Talk to your doctor about drinking alcohol.  Stop illegal drug use.  Stay current with shots (immunizations).  Manage your health conditions as told by your doctor.  Learn to manage  your stress.  Rest when you are tired.  If it is really hot outside:  Avoid intense activities.  Use air conditioning or fans, or get in a cooler place.  Avoid caffeine and alcohol.  Wear loose-fitting, lightweight, and light-colored clothing.  If it is really cold outside:  Avoid intense activities.  Layer your clothing.  Wear mittens or gloves, a hat, and a scarf when going outside.  Avoid alcohol.  Learn about heart failure and get support as needed.  Get help to maintain or improve your quality of life and your ability to care for yourself as needed. GET HELP IF:   You gain 03 lb/1.4 kg or more in 1 day or 05 lb/2.3 kg in a week.  You are more short of breath than usual.  You cannot do your normal activities.  You tire easily.  You cough more than normal, especially with activity.  You have any or more puffiness (swelling) in areas such as your hands, feet, ankles, or belly (abdomen).  You cannot sleep because it is hard to breathe.  You feel like your heart is beating fast (palpitations).  You get dizzy or lightheaded when you stand up. GET HELP RIGHT AWAY IF:   You have trouble breathing.  There is a change in mental status, such as becoming less alert or not being able to focus.  You have chest pain or discomfort.  You faint. MAKE SURE YOU:   Understand these   instructions.  Will watch your condition.  Will get help right away if you are not doing well or get worse. Document Released: 08/31/2008 Document Revised: 03/19/2013 Document Reviewed: 06/22/2012 [employer] Patient Information 2014 [employer].  

## 2013-12-08 NOTE — ED Provider Notes (Signed)
CSN: [id number]     Arrival date & time 12/08/13  0708 History  This chart was scribed for Richarda Blade, MD,  by Stacy Gardner, ED Scribe. The patient was seen in room APA02/APA02 and the patient's care was started at 7:38 AM.   First MD Initiated Contact with Patient 12/08/13 0710     Chief Complaint  Patient presents with  . Shortness of Breath  . Chest Pain   (Consider location/radiation/quality/duration/timing/severity/associated sxs/prior Treatment) The history is provided by the patient and medical records. No language interpreter was used.   HPI Comments: [name] is a 58 y.o. male who presents to the Emergency Department complaining of constant moderate SOB and constant moderate chest pain for the past six hours . Pt has a "gurling souding " in chest which keeps from sleeping. He states he was unable to casually breath last night and states " I had to breathe through my nose".  Pt has a non-productive cough with saliva and blood. Pt reports having similar symptom during a prior incidences of CHF. He mentions losing 40 pounds over the past three months which was noted during his check up at [facility]. Pt states having dizziness with nausea which is worse in the morning but was seen for this and dx with inner canal complications one month ago. Pt had an anterior wall MI. He denies fever.  He denies any present chest pain but he did have a prior episode two hours ago.  He does not have a current PCP.Pt has current medical hx of rectal cancer, DM, and HTN. He has never smoke tobacco.  Past Medical History  Diagnosis Date  . Essential hypertension, benign   . Type 2 diabetes mellitus   . Coronary atherosclerosis of native coronary artery     BMS to LAD 2001 at [facility], PTCA/atherectomy ramus and BMS to LAD 2009  . S/P colonoscopy     Normal via ostomy - September 2009  . Chronic systolic heart failure   . Myocardial infarction, anterior wall     Treated with tPA at [facility] 2000  .  GERD (gastroesophageal reflux disease)   . Cardiomyopathy, ischemic     BIV ICD St. Jude, LVEF 23%  . Paroxysmal atrial fibrillation     Amiodarone and Coumadin  . 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 Prostate cancer   . 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 Internal defibrillator and pacemaker  2010    x2 St. Jude device  . Abdominal and perineal resection of rectum with total mesorectal excision      10/04/2007  . Colonoscopy      05/17/2007. IMPRESSION: Semilunar, apple-core neoplasm low in the rectum (palpable on digital rectal exam) beginning at 5 cm and corkscrewing up 5 cm in length. This was a low rectal lesion consistent with colorectal carcinoma. It was biosied multiple times. The upstream colon all the way to the cecum appeared normal. Recommendations: Followup on path. Surgical Consultation   . Colonoscopy  09/14/2011    Procedure: COLONOSCOPY;  Surgeon: Daneil Dolin, MD;  Location: [facility];  Service: Endoscopy;  Laterality: N/A;  8:30- TCS via colostomy & pt has defibrillator  . Colostomy     Family History  Problem Relation Age of Onset  . Colon cancer Mother 50  . Colon cancer Sister 52  . Coronary artery disease Father    History  Substance Use Topics  . Smoking status: Never Smoker   . Smokeless tobacco: Never Used  . Alcohol Use: No     Comment: Former user    Review of Systems  Constitutional: Negative for fever.  HENT: Positive for congestion.   Respiratory: Positive for cough, shortness of breath and wheezing.   Cardiovascular: Negative for chest pain.  Gastrointestinal: Positive for nausea.  Neurological: Positive for dizziness.  Psychiatric/Behavioral: Positive for sleep disturbance.  All other systems reviewed and are negative.    Allergies  Review of patient's allergies indicates no known allergies.  Home Medications   Current Outpatient Rx  Name   Route  Sig  Dispense  Refill  . amiodarone (PACERONE) 200 MG tablet   Oral   Take 1 tablet (200 mg total) by mouth 2 (two) times daily.   60 tablet   1   . aspirin 81 MG EC tablet   Oral   Take 81 mg by mouth at bedtime.          . citalopram (CELEXA) 20 MG tablet   Oral   Take 1 tablet (20 mg total) by mouth daily.   30 tablet   2   . co-enzyme Q-10 50 MG capsule   Oral   Take 50 mg by mouth every morning.          Marland Kitchen COUMADIN 2.5 MG tablet   Oral   Take 2.5 mg by mouth every evening.          Marland Kitchen CRESTOR 20 MG tablet   Oral   Take 20 mg by mouth at bedtime.          . digoxin (LANOXIN) 0.125 MG tablet   Oral   Take 0.125 mg by mouth daily.         . enalapril (VASOTEC) 10 MG tablet   Oral   Take 10 mg by mouth 2 (two) times daily.          . insulin aspart (NOVOLOG) 100 UNIT/ML injection   Subcutaneous   Inject 12-14 Units into the skin 3 (three) times daily before meals. 14 units at breakfast and 12 units a lunch and 12 units at supper per sliding scale          . insulin glargine (LANTUS) 100 UNIT/ML injection   Subcutaneous   Inject 24 Units into the skin at bedtime.           . isosorbide mononitrate (IMDUR) 30 MG 24 hr tablet   Oral   Take 15 mg by mouth 2 (two) times daily.         . metFORMIN (GLUCOPHAGE) 500 MG tablet   Oral   Take 500 mg by mouth 2 (two) times daily with a meal.         . metoprolol (TOPROL-XL) 200 MG 24 hr tablet   Oral   Take 200 mg by mouth daily.         . niacin (NIASPAN) 1000 MG CR tablet   Oral   Take 1,000 mg by mouth at bedtime.           . Omega-3 Fatty Acids (FISH OIL) 1200 MG CAPS   Oral   Take 1,200 mg by mouth 2 (two) times daily.           . pantoprazole (PROTONIX) 40 MG tablet   Oral   Take 1 tablet (40 mg total) by mouth daily.   30 tablet   6   .  ranolazine (RANEXA) 1000 MG SR tablet   Oral   Take 1,000 mg by mouth 2 (two) times daily.         Marland Kitchen spironolactone (ALDACTONE)  25 MG tablet   Oral   Take 25 mg by mouth daily.          . fluticasone (FLONASE) 50 MCG/ACT nasal spray   Each Nare   Place 1 spray into both nostrils daily.         . furosemide (LASIX) 20 MG tablet   Oral   Take 1 tablet (20 mg total) by mouth 2 (two) times daily.   60 tablet   0   . nitroGLYCERIN (NITROLINGUAL) 0.4 MG/SPRAY spray   Sublingual   Place 1 spray under the tongue every 5 (five) minutes as needed. angina         . oxyCODONE-acetaminophen (PERCOCET) 5-325 MG per tablet   Oral   Take 1 tablet by mouth every 4 (four) hours as needed.   20 tablet   0    BP 103/74  Pulse 86  Temp(Src) 97.7 F (36.5 C) (Oral)  Resp 24  Ht 5\' 10"  (1.778 m)  Wt 218 lb 3 oz (98.969 kg)  BMI 31.31 kg/m2  SpO2 94% Physical Exam  Nursing note and vitals reviewed. Constitutional: He is oriented to person, place, and time. He appears well-developed and well-nourished.  HENT:  Head: Normocephalic and atraumatic.  Right Ear: External ear normal.  Left Ear: External ear normal.  Mouth/Throat: Mucous membranes are dry.  Eyes: Conjunctivae and EOM are normal. Pupils are equal, round, and reactive to light.  Neck: Normal range of motion and phonation normal. Neck supple.  Cardiovascular: Regular rhythm and intact distal pulses.  Exam reveals gallop.   Irregular heartbeat  S-3 gallop   Pulmonary/Chest: Effort normal and breath sounds normal. He has no wheezes. He has no rhonchi. He has no rales. He exhibits no bony tenderness.     Abdominal: Soft. Normal appearance. There is no tenderness.  Colostomy in the LLQ  Musculoskeletal: Normal range of motion.  Neurological: He is alert and oriented to person, place, and time. No cranial nerve deficit or sensory deficit. He exhibits normal muscle tone. Coordination normal.  Skin: Skin is warm, dry and intact.  Psychiatric: He has a normal mood and affect. His behavior is normal. Judgment and thought content normal.    ED Course   Procedures (including critical care time) DIAGNOSTIC STUDIES: Oxygen Saturation is 94% on [address], low by my interpretation.    COORDINATION OF CARE:  7:42 AM Discussed course of care with pt which includes EKG, cardiac monitoring, laboratory tests, troponin 1, Pro b natriuretic peptide and chest x-ray. Pt understands and agrees.  12:08 PM Pt states he is feeling better.  Discussed with pt the results of laboratory and radiology test. Mentioned to pt the possibility of heart failure. Discussed with pt to increase dosage of Lasix and to follow up with cardiologist. Continue to administer oxygen and observe pt while ambulating prior to discharge.   CRITICAL CARE Performed by: Richarda Blade Total critical care time: 40 minutes Critical care time was exclusive of separately billable procedures and treating other patients. Critical care was necessary to treat or prevent imminent or life-threatening deterioration. Critical care was time spent personally by me on the following activities: development of treatment plan with patient and/or surrogate as well as nursing, discussions with consultants, evaluation of patient's response to treatment, examination of patient, obtaining history from  patient or surrogate, ordering and performing treatments and interventions, ordering and review of laboratory studies, ordering and review of radiographic studies, pulse oximetry and re-evaluation of patient's condition.  Labs Review Labs Reviewed  CBC WITH DIFFERENTIAL - Abnormal; Notable for the following:    WBC 14.2 (*)    Neutrophils Relative % 86 (*)    Neutro Abs 12.3 (*)    Lymphocytes Relative 7 (*)    All other components within normal limits  BASIC METABOLIC PANEL - Abnormal; Notable for the following:    Sodium 132 (*)    Chloride 94 (*)    CO2 17 (*)    Glucose, Bld 360 (*)    BUN 30 (*)    Creatinine, Ser 1.62 (*)    GFR calc non Af Amer 45 (*)    GFR calc Af Amer 52 (*)    All other components  within normal limits  PRO B NATRIURETIC PEPTIDE - Abnormal; Notable for the following:    Pro B Natriuretic peptide (BNP) 7155.0 (*)    All other components within normal limits  DIGOXIN LEVEL - Abnormal; Notable for the following:    Digoxin Level 0.4 (*)    All other components within normal limits  PROTIME-INR - Abnormal; Notable for the following:    Prothrombin Time 19.3 (*)    INR 1.68 (*)    All other components within normal limits  TROPONIN I   Imaging Review Dg Chest 2 View  12/08/2013   CLINICAL DATA:  Short of breath and chest pain  EXAM: CHEST  2 VIEW  COMPARISON:  DG THORACIC SPINE 4V dated 11/24/2013; DG CHEST 1V PORT dated 10/29/2013  FINDINGS: Left-sided pacemaker overlies normal cardiac silhouette. There is a new bilateral peripheral airspace disease. No pleural fluid is evident. There is some increased central venous congestion. No pneumothorax.  IMPRESSION: New of peripheral airspace disease suggesting pulmonary edema. Atypical infection felt less likely.   Electronically Signed   By: Suzy Bouchard M.D.   On: 12/08/2013 08:46    EKG Interpretation   None       MDM   1. CHF (congestive heart failure), acute on chronic, systolic     Nonspecific shortness of breath. He has a history of congestive heart failure. His weight today at 98.9 kg appears to be at his baseline, compared to weights taken over the last several years. His BNP is elevated, higher than prior readings. His renal insufficiency today, appears to be at his baseline. His last cardiac echo, 4 months ago, had systolicejection function, at 30-35%. There is no evidence for acute myocardial infarct.  Nursing Notes Reviewed/ Care Coordinated, and agree without changes. Applicable Imaging Reviewed.  Interpretation of Laboratory Data incorporated into ED treatment   Plan: Home Medications- increase Lasix to 20 twice a day; Home Treatments and Observation- rest; return here if the recommended treatment,  does not improve the symptoms; Recommended follow up- cardiology followup in 3-5 days    I personally performed the services described in this documentation, which was scribed in my presence. The recorded information has been reviewed and is accurate.       Richarda Blade, MD 12/08/13 1538

## 2013-12-08 NOTE — ED Notes (Signed)
Dr Wentz at bedside,  

## 2013-12-08 NOTE — ED Notes (Signed)
Patient given ice water at this time. Patient states that he  feels like he might need to urinate, pt given urinal.

## 2013-12-08 NOTE — ED Notes (Signed)
Weight performed on pt, 218.3lbs, pt has bruising that is yellow in color noted to left scapula area, states that he fell from a tree stand a few weeks ago and was seen for the fall in the er,

## 2013-12-08 NOTE — ED Notes (Signed)
Pt placed on oxygen at 2lpm via [place] with improvement in sob, pulse ox increased to 98%/

## 2013-12-08 NOTE — ED Notes (Signed)
Pt ambulatory around nursing desk, upon return to tx room, pt denies any sob, states "I feel the same as when I am at home", pulse ox 94-95% on RA after walking,

## 2013-12-08 NOTE — ED Notes (Signed)
Pt c/o cough that is productive with ?blood, brown colored sputum that started last night, reports being "cold" some during the night when asked about any chills or fever, chest pain that is described as a "soreness" that is worse with coughing. Admits to being sob, denies any issues with swelling or extra fluid,

## 2013-12-11 ENCOUNTER — Emergency Department (HOSPITAL_COMMUNITY): Payer: Medicare Other

## 2013-12-11 ENCOUNTER — Inpatient Hospital Stay (HOSPITAL_COMMUNITY)
Admission: EM | Admit: 2013-12-11 | Discharge: 2013-12-13 | DRG: 194 | Disposition: A | Discharge status: Home / Self Care | Payer: Medicare Other | Attending: Family Medicine | Admitting: Family Medicine

## 2013-12-11 ENCOUNTER — Encounter (HOSPITAL_COMMUNITY): Payer: Self-pay | Admitting: Emergency Medicine

## 2013-12-11 DIAGNOSIS — B349 Viral infection, unspecified: Secondary | ICD-10-CM | POA: Insufficient documentation

## 2013-12-11 DIAGNOSIS — I9589 Other hypotension: Secondary | ICD-10-CM | POA: Diagnosis present

## 2013-12-11 DIAGNOSIS — I200 Unstable angina: Secondary | ICD-10-CM

## 2013-12-11 DIAGNOSIS — Z8249 Family history of ischemic heart disease and other diseases of the circulatory system: Secondary | ICD-10-CM

## 2013-12-11 DIAGNOSIS — E785 Hyperlipidemia, unspecified: Secondary | ICD-10-CM

## 2013-12-11 DIAGNOSIS — N183 Chronic kidney disease, stage 3 unspecified: Secondary | ICD-10-CM | POA: Diagnosis present

## 2013-12-11 DIAGNOSIS — J189 Pneumonia, unspecified organism: Secondary | ICD-10-CM | POA: Diagnosis not present

## 2013-12-11 DIAGNOSIS — I10 Essential (primary) hypertension: Secondary | ICD-10-CM

## 2013-12-11 DIAGNOSIS — I129 Hypertensive chronic kidney disease with stage 1 through stage 4 chronic kidney disease, or unspecified chronic kidney disease: Secondary | ICD-10-CM | POA: Diagnosis present

## 2013-12-11 DIAGNOSIS — Z9861 Coronary angioplasty status: Secondary | ICD-10-CM | POA: Diagnosis not present

## 2013-12-11 DIAGNOSIS — R0602 Shortness of breath: Secondary | ICD-10-CM | POA: Diagnosis not present

## 2013-12-11 DIAGNOSIS — Z6832 Body mass index (BMI) 32.0-32.9, adult: Secondary | ICD-10-CM | POA: Diagnosis not present

## 2013-12-11 DIAGNOSIS — J9601 Acute respiratory failure with hypoxia: Secondary | ICD-10-CM

## 2013-12-11 DIAGNOSIS — R69 Illness, unspecified: Secondary | ICD-10-CM

## 2013-12-11 DIAGNOSIS — I219 Acute myocardial infarction, unspecified: Secondary | ICD-10-CM

## 2013-12-11 DIAGNOSIS — Z95810 Presence of automatic (implantable) cardiac defibrillator: Secondary | ICD-10-CM | POA: Diagnosis not present

## 2013-12-11 DIAGNOSIS — Z85038 Personal history of other malignant neoplasm of large intestine: Secondary | ICD-10-CM

## 2013-12-11 DIAGNOSIS — N184 Chronic kidney disease, stage 4 (severe): Secondary | ICD-10-CM | POA: Diagnosis present

## 2013-12-11 DIAGNOSIS — Z794 Long term (current) use of insulin: Secondary | ICD-10-CM

## 2013-12-11 DIAGNOSIS — C61 Malignant neoplasm of prostate: Secondary | ICD-10-CM | POA: Diagnosis not present

## 2013-12-11 DIAGNOSIS — I2510 Atherosclerotic heart disease of native coronary artery without angina pectoris: Secondary | ICD-10-CM

## 2013-12-11 DIAGNOSIS — E861 Hypovolemia: Secondary | ICD-10-CM

## 2013-12-11 DIAGNOSIS — Z933 Colostomy status: Secondary | ICD-10-CM | POA: Diagnosis not present

## 2013-12-11 DIAGNOSIS — I509 Heart failure, unspecified: Secondary | ICD-10-CM | POA: Diagnosis present

## 2013-12-11 DIAGNOSIS — I959 Hypotension, unspecified: Secondary | ICD-10-CM | POA: Diagnosis not present

## 2013-12-11 DIAGNOSIS — Z7982 Long term (current) use of aspirin: Secondary | ICD-10-CM

## 2013-12-11 DIAGNOSIS — R0989 Other specified symptoms and signs involving the circulatory and respiratory systems: Secondary | ICD-10-CM | POA: Diagnosis not present

## 2013-12-11 DIAGNOSIS — K219 Gastro-esophageal reflux disease without esophagitis: Secondary | ICD-10-CM | POA: Diagnosis present

## 2013-12-11 DIAGNOSIS — Z9221 Personal history of antineoplastic chemotherapy: Secondary | ICD-10-CM | POA: Diagnosis not present

## 2013-12-11 DIAGNOSIS — Z85048 Personal history of other malignant neoplasm of rectum, rectosigmoid junction, and anus: Secondary | ICD-10-CM | POA: Diagnosis not present

## 2013-12-11 DIAGNOSIS — I5022 Chronic systolic (congestive) heart failure: Secondary | ICD-10-CM

## 2013-12-11 DIAGNOSIS — Z923 Personal history of irradiation: Secondary | ICD-10-CM | POA: Diagnosis not present

## 2013-12-11 DIAGNOSIS — J111 Influenza due to unidentified influenza virus with other respiratory manifestations: Secondary | ICD-10-CM | POA: Diagnosis not present

## 2013-12-11 DIAGNOSIS — Z800 Family history of malignant neoplasm of digestive organs: Secondary | ICD-10-CM

## 2013-12-11 DIAGNOSIS — Z7901 Long term (current) use of anticoagulants: Secondary | ICD-10-CM

## 2013-12-11 DIAGNOSIS — E119 Type 2 diabetes mellitus without complications: Secondary | ICD-10-CM | POA: Diagnosis not present

## 2013-12-11 DIAGNOSIS — N289 Disorder of kidney and ureter, unspecified: Secondary | ICD-10-CM

## 2013-12-11 DIAGNOSIS — Z8546 Personal history of malignant neoplasm of prostate: Secondary | ICD-10-CM | POA: Diagnosis not present

## 2013-12-11 DIAGNOSIS — I252 Old myocardial infarction: Secondary | ICD-10-CM | POA: Diagnosis not present

## 2013-12-11 DIAGNOSIS — N179 Acute kidney failure, unspecified: Secondary | ICD-10-CM | POA: Diagnosis present

## 2013-12-11 DIAGNOSIS — I4891 Unspecified atrial fibrillation: Secondary | ICD-10-CM | POA: Diagnosis not present

## 2013-12-11 DIAGNOSIS — J1100 Influenza due to unidentified influenza virus with unspecified type of pneumonia: Secondary | ICD-10-CM | POA: Diagnosis present

## 2013-12-11 DIAGNOSIS — I255 Ischemic cardiomyopathy: Secondary | ICD-10-CM | POA: Diagnosis present

## 2013-12-11 DIAGNOSIS — R740 Nonspecific elevation of levels of transaminase and lactic acid dehydrogenase [LDH]: Secondary | ICD-10-CM

## 2013-12-11 DIAGNOSIS — E871 Hypo-osmolality and hyponatremia: Secondary | ICD-10-CM | POA: Diagnosis present

## 2013-12-11 DIAGNOSIS — I2589 Other forms of chronic ischemic heart disease: Secondary | ICD-10-CM | POA: Diagnosis present

## 2013-12-11 DIAGNOSIS — I4892 Unspecified atrial flutter: Secondary | ICD-10-CM

## 2013-12-11 DIAGNOSIS — N189 Chronic kidney disease, unspecified: Secondary | ICD-10-CM

## 2013-12-11 DIAGNOSIS — E669 Obesity, unspecified: Secondary | ICD-10-CM

## 2013-12-11 DIAGNOSIS — R7401 Elevation of levels of liver transaminase levels: Secondary | ICD-10-CM

## 2013-12-11 DIAGNOSIS — R7402 Elevation of levels of lactic acid dehydrogenase (LDH): Secondary | ICD-10-CM | POA: Diagnosis present

## 2013-12-11 DIAGNOSIS — C20 Malignant neoplasm of rectum: Secondary | ICD-10-CM

## 2013-12-11 DIAGNOSIS — I480 Paroxysmal atrial fibrillation: Secondary | ICD-10-CM

## 2013-12-11 DIAGNOSIS — E875 Hyperkalemia: Secondary | ICD-10-CM

## 2013-12-11 DIAGNOSIS — B9789 Other viral agents as the cause of diseases classified elsewhere: Secondary | ICD-10-CM | POA: Diagnosis not present

## 2013-12-11 DIAGNOSIS — I5023 Acute on chronic systolic (congestive) heart failure: Secondary | ICD-10-CM

## 2013-12-11 LAB — COMPREHENSIVE METABOLIC PANEL
ALBUMIN: 2.7 g/dL — AB (ref 3.5–5.2)
ALT: 91 U/L — ABNORMAL HIGH (ref 0–53)
AST: 55 U/L — ABNORMAL HIGH (ref 0–37)
Alkaline Phosphatase: 58 U/L (ref 39–117)
BUN: 31 mg/dL — ABNORMAL HIGH (ref 6–23)
CHLORIDE: 94 meq/L — AB (ref 96–112)
CO2: 24 mEq/L (ref 19–32)
Calcium: 9.4 mg/dL (ref 8.4–10.5)
Creatinine, Ser: 1.43 mg/dL — ABNORMAL HIGH (ref 0.50–1.35)
GFR calc Af Amer: 60 mL/min — ABNORMAL LOW (ref >90)
GFR calc non Af Amer: 52 mL/min — ABNORMAL LOW (ref >90)
GLUCOSE: 275 mg/dL — AB (ref 70–99)
POTASSIUM: 4.5 meq/L (ref 3.7–5.3)
Sodium: 131 mEq/L — ABNORMAL LOW (ref 137–147)
Total Bilirubin: 1.5 mg/dL — ABNORMAL HIGH (ref 0.3–1.2)
Total Protein: 7.3 g/dL (ref 6.0–8.3)

## 2013-12-11 LAB — PROTIME-INR
INR: 2.06 — ABNORMAL HIGH (ref 0.00–1.49)
PROTHROMBIN TIME: 22.6 s — AB (ref 11.6–15.2)

## 2013-12-11 LAB — CBC WITH DIFFERENTIAL/PLATELET
BASOS PCT: 0 % (ref 0–1)
Basophils Absolute: 0 10*3/uL (ref 0.0–0.1)
Eosinophils Absolute: 0.2 10*3/uL (ref 0.0–0.7)
Eosinophils Relative: 2 % (ref 0–5)
HCT: 37 % — ABNORMAL LOW (ref 39.0–52.0)
HEMOGLOBIN: 12 g/dL — AB (ref 13.0–17.0)
LYMPHS ABS: 0.7 10*3/uL (ref 0.7–4.0)
LYMPHS PCT: 8 % — AB (ref 12–46)
MCH: 28 pg (ref 26.0–34.0)
MCHC: 32.4 g/dL (ref 30.0–36.0)
MCV: 86.2 fL (ref 78.0–100.0)
MONOS PCT: 7 % (ref 3–12)
Monocytes Absolute: 0.6 10*3/uL (ref 0.1–1.0)
NEUTROS ABS: 7.2 10*3/uL (ref 1.7–7.7)
NEUTROS PCT: 82 % — AB (ref 43–77)
Platelets: 189 10*3/uL (ref 150–400)
RBC: 4.29 MIL/uL (ref 4.22–5.81)
RDW: 15 % (ref 11.5–15.5)
WBC: 8.8 10*3/uL (ref 4.0–10.5)

## 2013-12-11 LAB — GLUCOSE, CAPILLARY
GLUCOSE-CAPILLARY: 193 mg/dL — AB (ref 70–99)
Glucose-Capillary: 178 mg/dL — ABNORMAL HIGH (ref 70–99)

## 2013-12-11 LAB — LACTIC ACID, PLASMA: Lactic Acid, Venous: 1.8 mmol/L (ref 0.5–2.2)

## 2013-12-11 LAB — TROPONIN I

## 2013-12-11 LAB — INFLUENZA PANEL BY PCR (TYPE A & B)
H1N1 flu by pcr: NOT DETECTED
Influenza A By PCR: NEGATIVE
Influenza B By PCR: NEGATIVE

## 2013-12-11 LAB — DIGOXIN LEVEL: Digoxin Level: 0.3 ng/mL — ABNORMAL LOW (ref 0.8–2.0)

## 2013-12-11 LAB — PRO B NATRIURETIC PEPTIDE: Pro B Natriuretic peptide (BNP): 3985 pg/mL — ABNORMAL HIGH (ref 0–125)

## 2013-12-11 MED ORDER — NIACIN ER (ANTIHYPERLIPIDEMIC) 500 MG PO TBCR
1000.0000 mg | EXTENDED_RELEASE_TABLET | Freq: Every day | ORAL | Status: DC
  Administered 2013-12-11 – 2013-12-12 (×2): 1000 mg via ORAL
  Filled 2013-12-11 (×3): qty 2

## 2013-12-11 MED ORDER — ASPIRIN EC 81 MG PO TBEC
81.0000 mg | DELAYED_RELEASE_TABLET | Freq: Every day | ORAL | Status: DC
  Administered 2013-12-11 – 2013-12-12 (×2): 81 mg via ORAL
  Filled 2013-12-11 (×2): qty 1

## 2013-12-11 MED ORDER — DOXYCYCLINE HYCLATE 100 MG IV SOLR
100.0000 mg | Freq: Two times a day (BID) | INTRAVENOUS | Status: DC
  Administered 2013-12-11 – 2013-12-13 (×4): 100 mg via INTRAVENOUS
  Filled 2013-12-11 (×6): qty 100

## 2013-12-11 MED ORDER — MORPHINE SULFATE 2 MG/ML IJ SOLN: 2.0000 mg | INTRAMUSCULAR | Status: DC | PRN

## 2013-12-11 MED ORDER — METOPROLOL SUCCINATE ER 50 MG PO TB24
100.0000 mg | ORAL_TABLET | Freq: Every day | ORAL | Status: DC
  Administered 2013-12-12 – 2013-12-13 (×2): 100 mg via ORAL
  Filled 2013-12-11 (×2): qty 2

## 2013-12-11 MED ORDER — ACETAMINOPHEN 325 MG PO TABS: 650.0000 mg | ORAL_TABLET | Freq: Four times a day (QID) | ORAL | Status: DC | PRN

## 2013-12-11 MED ORDER — RANOLAZINE ER 500 MG PO TB12
1000.0000 mg | ORAL_TABLET | Freq: Two times a day (BID) | ORAL | Status: DC
  Administered 2013-12-11 – 2013-12-13 (×4): 1000 mg via ORAL
  Filled 2013-12-11 (×6): qty 2

## 2013-12-11 MED ORDER — SODIUM CHLORIDE 0.9 % IV BOLUS (SEPSIS)
250.0000 mL | Freq: Once | INTRAVENOUS | Status: AC
  Administered 2013-12-11: 250 mL via INTRAVENOUS

## 2013-12-11 MED ORDER — DIGOXIN 125 MCG PO TABS
0.1250 mg | ORAL_TABLET | ORAL | Status: DC
  Filled 2013-12-11: qty 1

## 2013-12-11 MED ORDER — OMEGA-3-ACID ETHYL ESTERS 1 G PO CAPS
1.0000 g | ORAL_CAPSULE | Freq: Two times a day (BID) | ORAL | Status: DC
  Administered 2013-12-11 – 2013-12-13 (×4): 1 g via ORAL
  Filled 2013-12-11 (×4): qty 1

## 2013-12-11 MED ORDER — LEVALBUTEROL HCL 0.63 MG/3ML IN NEBU: 0.6300 mg | INHALATION_SOLUTION | Freq: Three times a day (TID) | RESPIRATORY_TRACT | Status: DC | PRN

## 2013-12-11 MED ORDER — WARFARIN SODIUM 2.5 MG PO TABS
2.5000 mg | ORAL_TABLET | Freq: Once | ORAL | Status: AC
  Administered 2013-12-11: 2.5 mg via ORAL
  Filled 2013-12-11: qty 1

## 2013-12-11 MED ORDER — SODIUM CHLORIDE 0.9 % IV SOLN
INTRAVENOUS | Status: AC
  Administered 2013-12-11: 15:00:00 via INTRAVENOUS

## 2013-12-11 MED ORDER — WARFARIN - PHARMACIST DOSING INPATIENT: Status: DC

## 2013-12-11 MED ORDER — ONDANSETRON HCL 4 MG PO TABS: 4.0000 mg | ORAL_TABLET | Freq: Four times a day (QID) | ORAL | Status: DC | PRN

## 2013-12-11 MED ORDER — OSELTAMIVIR PHOSPHATE 75 MG PO CAPS
75.0000 mg | ORAL_CAPSULE | Freq: Two times a day (BID) | ORAL | Status: DC
  Administered 2013-12-11 – 2013-12-13 (×5): 75 mg via ORAL
  Filled 2013-12-11 (×5): qty 1

## 2013-12-11 MED ORDER — SODIUM CHLORIDE 0.9 % IJ SOLN
3.0000 mL | Freq: Two times a day (BID) | INTRAMUSCULAR | Status: DC
  Administered 2013-12-12: 3 mL via INTRAVENOUS

## 2013-12-11 MED ORDER — INSULIN ASPART 100 UNIT/ML ~~LOC~~ SOLN
0.0000 [IU] | Freq: Three times a day (TID) | SUBCUTANEOUS | Status: DC
Start: 2013-12-11 — End: 2013-12-13
  Administered 2013-12-11 – 2013-12-12 (×2): 3 [IU] via SUBCUTANEOUS
  Administered 2013-12-12: 2 [IU] via SUBCUTANEOUS
  Administered 2013-12-12: 3 [IU] via SUBCUTANEOUS
  Administered 2013-12-13: 5 [IU] via SUBCUTANEOUS
  Administered 2013-12-13: 3 [IU] via SUBCUTANEOUS

## 2013-12-11 MED ORDER — DIGOXIN 125 MCG PO TABS
0.1250 mg | ORAL_TABLET | ORAL | Status: DC
  Administered 2013-12-12: 0.125 mg via ORAL
  Filled 2013-12-11: qty 1

## 2013-12-11 MED ORDER — CITALOPRAM HYDROBROMIDE 20 MG PO TABS
20.0000 mg | ORAL_TABLET | Freq: Every day | ORAL | Status: DC
  Administered 2013-12-11 – 2013-12-13 (×3): 20 mg via ORAL
  Filled 2013-12-11 (×3): qty 1

## 2013-12-11 MED ORDER — AMIODARONE HCL 200 MG PO TABS
200.0000 mg | ORAL_TABLET | Freq: Two times a day (BID) | ORAL | Status: DC
  Administered 2013-12-11 – 2013-12-13 (×4): 200 mg via ORAL
  Filled 2013-12-11 (×4): qty 1

## 2013-12-11 MED ORDER — INSULIN GLARGINE 100 UNIT/ML ~~LOC~~ SOLN
12.0000 [IU] | Freq: Every day | SUBCUTANEOUS | Status: DC
  Administered 2013-12-11 – 2013-12-12 (×2): 12 [IU] via SUBCUTANEOUS
  Filled 2013-12-11 (×3): qty 0.12

## 2013-12-11 MED ORDER — NITROGLYCERIN 0.4 MG/SPRAY TL SOLN
1.0000 | Status: DC | PRN
  Filled 2013-12-11: qty 4.9

## 2013-12-11 MED ORDER — FLUTICASONE PROPIONATE 50 MCG/ACT NA SUSP
1.0000 | Freq: Every day | NASAL | Status: DC
  Administered 2013-12-12 – 2013-12-13 (×2): 1 via NASAL
  Filled 2013-12-11: qty 16

## 2013-12-11 MED ORDER — PANTOPRAZOLE SODIUM 40 MG PO TBEC
40.0000 mg | DELAYED_RELEASE_TABLET | Freq: Every day | ORAL | Status: DC
  Administered 2013-12-11 – 2013-12-13 (×3): 40 mg via ORAL
  Filled 2013-12-11 (×3): qty 1

## 2013-12-11 MED ORDER — FISH OIL 1200 MG PO CAPS: 1200.0000 mg | ORAL_CAPSULE | Freq: Two times a day (BID) | ORAL | Status: DC

## 2013-12-11 MED ORDER — ATORVASTATIN CALCIUM 40 MG PO TABS
40.0000 mg | ORAL_TABLET | Freq: Every day | ORAL | Status: DC
  Administered 2013-12-11 – 2013-12-12 (×2): 40 mg via ORAL
  Filled 2013-12-11 (×2): qty 1

## 2013-12-11 MED ORDER — ACETAMINOPHEN 650 MG RE SUPP: 650.0000 mg | Freq: Four times a day (QID) | RECTAL | Status: DC | PRN

## 2013-12-11 MED ORDER — ONDANSETRON HCL 4 MG/2ML IJ SOLN
4.0000 mg | Freq: Four times a day (QID) | INTRAMUSCULAR | Status: DC | PRN
Start: 2013-12-11 — End: 2013-12-13

## 2013-12-11 MED ORDER — HYDROCODONE-ACETAMINOPHEN 5-325 MG PO TABS: 1.0000 | ORAL_TABLET | ORAL | Status: DC | PRN

## 2013-12-11 NOTE — ED Notes (Signed)
Pt laying on stretcher. Only c/o feeling tired.  No distress

## 2013-12-11 NOTE — Progress Notes (Signed)
ANTICOAGULATION CONSULT NOTE - Initial Consult  Pharmacy Consult for Warfarin Indication: atrial fibrillation  No Known Allergies  Patient Measurements: Height: 5\' 10"  (177.8 cm) Weight: 214 lb (97.07 kg) IBW/kg (Calculated) : 73 Heparin Dosing Weight:   Vital Signs: Temp: 97.7 F (36.5 C) (01/06 1019) Temp src: Oral (01/06 1019) BP: 99/59 mmHg (01/06 1400) Pulse Rate: 94 (01/06 1305)  Labs:  Recent Labs  12/11/13 1140  HGB 12.0*  HCT 37.0*  PLT 189  LABPROT 22.6*  INR 2.06*  CREATININE 1.43*  TROPONINI <0.30    Estimated Creatinine Clearance: 65 ml/min (by C-G formula based on Cr of 1.43).   Medical History: Past Medical History  Diagnosis Date  . Essential hypertension, benign   . Type 2 diabetes mellitus   . Coronary atherosclerosis of native coronary artery     BMS to LAD 2001 at [facility], PTCA/atherectomy ramus and BMS to LAD 2009  . S/P colonoscopy     Normal via ostomy - September 2009  . Chronic systolic heart failure   . Myocardial infarction, anterior wall     Treated with tPA at [facility] 2000  . GERD (gastroesophageal reflux disease)   . Cardiomyopathy, ischemic     BIV ICD St. Jude, LVEF 23%  . Paroxysmal atrial fibrillation     Amiodarone and Coumadin  . 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 Prostate cancer   . 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 oseltamivir  75 mg Oral BID    Assessment: Admitted for flu symptoms and hypotension Continuation of warfarin for atrial fibrillation PTA Warfarin 2.5 mg po daily INR therapeutic on admission  Goal of Therapy:  INR 2-3 Monitor platelets by anticoagulation protocol: Yes   Plan:  Warfarin 2.5 mg po x 1 dose today  INR/PT daily   Abner Greenspan, Kiari Hosmer Bennett 12/11/2013,3:19 PM

## 2013-12-11 NOTE — H&P (Signed)
[facility]                                                                                    Patient Demographics  [name], is a 58 y.o. male  MRN: [id number]   DOB - 09/17/1956  Admit Date - 12/11/2013  Outpatient Primary MD for the patient is Glo Herring., MD   With History of -  Past Medical History  Diagnosis Date  . Essential hypertension, benign   . Type 2 diabetes mellitus   . Coronary atherosclerosis of native coronary artery     BMS to LAD 2001 at [facility], PTCA/atherectomy ramus and BMS to LAD 2009  . S/P colonoscopy     Normal via ostomy - September 2009  . Chronic systolic heart failure   . Myocardial infarction, anterior wall     Treated with tPA at [facility] 2000  . GERD (gastroesophageal reflux disease)   . Cardiomyopathy, ischemic     BIV ICD St. Jude, LVEF 23%  . Paroxysmal atrial fibrillation     Amiodarone and Coumadin  . 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 Prostate cancer   . 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 Internal defibrillator and pacemaker  2010    x2 St. Jude device  . Abdominal and perineal resection of rectum with total mesorectal excision      10/04/2007  . Colonoscopy      05/17/2007. IMPRESSION: Semilunar, apple-core neoplasm low in the rectum (palpable on digital rectal exam) beginning at 5 cm and corkscrewing up 5 cm in length. This was a low rectal lesion consistent with colorectal carcinoma. It was biosied multiple times. The upstream colon all the way to the cecum appeared normal. Recommendations: Followup on path. Surgical Consultation   . Colonoscopy  09/14/2011    Procedure: COLONOSCOPY;  Surgeon: Daneil Dolin, MD;  Location: [facility];  Service: Endoscopy;  Laterality: N/A;  8:30- TCS via colostomy & pt has defibrillator  . Colostomy      in for   Chief Complaint  Patient presents with  . Hypotension  . Influenza      HPI  [name]  is a 58 y.o. male, with a past medical history significant for anal cancer status post colostomy, chemotherapy and radiation; ischemic cardiomyopathy with an EF of approximately 30% (08/08/2013) status post pacemaker placement; prostate cancer; paroxysmal atrial for ablation on chronic Coumadin; and diabetes mellitus. He reports that he began to feel bad on Friday, January 2. Primarily he was short of breath. He did not have an appetite. He took an additional dose of Lasix but it did not help his symptoms or cause him to urinate more than usual.. On Saturday he went to the emergency department and was given IV Lasix. Again, he says the Lasix did not cause and to have increased urination. His symptoms did not improve. He denies fever, chest pain or changes in bowel habits. He endorses shortness of breath and a cough that is somewhat productive. His wife has similar symptoms. In the emergency department he  is found to be hypotensive, mildly hyponatremic, and with mildly low chloride. He appears dry. Chest x-ray shows " improving airspace opacities on the right".  Review of Systems    In addition to the HPI above, the patient reports dry heaving occasionally over the past several months. He mentions this is usually preceded by having excess water in his mouth. No Fever-chills, No Headache, No changes with Vision or hearing, No problems swallowing food or Liquids, No Abdominal pain, No Nausea or Vomiting, Bowel movements are regular, No Blood in stool or Urine, No dysuria, No new skin rashes or bruises, No new joints pains-aches,  No new weakness, tingling, numbness in any extremity, No recent weight gain or loss, No polyuria, polydypsia or polyphagia, No significant Mental Stressors.  A full 10 point Review of Systems was done, except as stated above, all other Review of Systems were negative.   Social History History  Substance Use Topics  . Smoking status: Never Smoker    . Smokeless tobacco: Never Used  . Alcohol Use: No     Comment: Former user   he lives at home with his wife and is self-sufficient his ADLs   Family History Family History  Problem Relation Age of Onset  . Colon cancer Mother 50  . Colon cancer Sister 77  . Coronary artery disease Father    his father had an MI in his early 29s his grandmother also had myocardial infarction.   Prior to Admission medications   Medication Sig Start Date End Date Taking? Authorizing Provider  amiodarone (PACERONE) 200 MG tablet Take 1 tablet (200 mg total) by mouth 2 (two) times daily. 10/25/13  Yes Kelvin Cellar, MD  aspirin 81 MG EC tablet Take 81 mg by mouth at bedtime.    Yes Historical Provider, MD  citalopram (CELEXA) 20 MG tablet Take 1 tablet (20 mg total) by mouth daily. 11/22/13  Yes Lorretta Harp, MD  co-enzyme Q-10 50 MG capsule Take 50 mg by mouth every morning.    Yes Historical Provider, MD  COUMADIN 2.5 MG tablet Take 2.5 mg by mouth every evening.  07/16/11  Yes Historical Provider, MD  CRESTOR 20 MG tablet Take 20 mg by mouth at bedtime.  08/03/11  Yes Historical Provider, MD  digoxin (LANOXIN) 0.125 MG tablet Take 0.125 mg by mouth every other day.    Yes Historical Provider, MD  enalapril (VASOTEC) 10 MG tablet Take 10 mg by mouth 2 (two) times daily.  08/12/11  Yes Historical Provider, MD  fluticasone (FLONASE) 50 MCG/ACT nasal spray Place 1 spray into both nostrils daily. 11/21/13  Yes Historical Provider, MD  furosemide (LASIX) 20 MG tablet Take 1 tablet (20 mg total) by mouth 2 (two) times daily. 12/08/13  Yes Richarda Blade, MD  insulin aspart (NOVOLOG) 100 UNIT/ML injection Inject 12-14 Units into the skin 3 (three) times daily before meals. 14 units at breakfast and 12 units a lunch and 12 units at supper per sliding scale    Yes Historical Provider, MD  insulin glargine (LANTUS) 100 UNIT/ML injection Inject 24 Units into the skin at bedtime.     Yes Historical Provider, MD   isosorbide mononitrate (IMDUR) 30 MG 24 hr tablet Take 15 mg by mouth 2 (two) times daily.   Yes Historical Provider, MD  metFORMIN (GLUCOPHAGE) 500 MG tablet Take 500 mg by mouth 2 (two) times daily with a meal.   Yes Historical Provider, MD  metoprolol (TOPROL-XL) 200 MG 24 hr  tablet Take 200 mg by mouth daily.   Yes Historical Provider, MD  niacin (NIASPAN) 1000 MG CR tablet Take 1,000 mg by mouth at bedtime.     Yes Historical Provider, MD  Omega-3 Fatty Acids (FISH OIL) 1200 MG CAPS Take 1,200 mg by mouth 2 (two) times daily.     Yes Historical Provider, MD  pantoprazole (PROTONIX) 40 MG tablet Take 1 tablet (40 mg total) by mouth daily. 11/20/13  Yes Lorretta Harp, MD  ranolazine (RANEXA) 1000 MG SR tablet Take 1,000 mg by mouth 2 (two) times daily.   Yes Historical Provider, MD  spironolactone (ALDACTONE) 25 MG tablet Take 25 mg by mouth daily.  10/25/13  Yes Kelvin Cellar, MD  nitroGLYCERIN (NITROLINGUAL) 0.4 MG/SPRAY spray Place 1 spray under the tongue every 5 (five) minutes as needed. angina    Historical Provider, MD    No Known Allergies  Physical Exam  Vitals  Blood pressure 99/59, pulse 94, temperature 97.7 F (36.5 C), temperature source Oral, resp. rate 20, height 5\' 10"  (1.778 m), weight 97.07 kg (214 lb), SpO2 98.00%.   General:  lying in bed in NAD, he appears flushed in the face  Psych:  Normal affect and insight, Not Suicidal or Homicidal, Awake Alert, Oriented X 3.  Neuro:   No F.N deficits, ALL C.Nerves Intact, Strength 5/5 all 4 extremities, Sensation intact all 4 extremities, Plantars down going.  ENT:  Ears and Eyes appear Normal, Conjunctivae clear, PERRLA. Dry Oral Mucosa.  Neck:  Supple Neck, No JVD, No cervical lymphadenopathy appriciated, No Carotid Bruits.  Respiratory:  Symmetrical Chest wall movement, Good air movement bilaterally, CTAB.  Cardiac:  RRR, No Gallops, Rubs or Murmurs, No Parasternal Heave.  Abdomen:  Positive Bowel Sounds,  Abdomen Soft, Non tender, No organomegaly appriciated. Colostomy in place- site is clean dry and without erythema  Skin:  No Cyanosis, Normal Skin Turgor, No Skin Rash or Bruise.  Extremities:  Good muscle tone,  joints appear normal , no effusions, Normal ROM.   Data Review  CBC  Recent Labs Lab 12/08/13 0747 12/11/13 1140  WBC 14.2* 8.8  HGB 13.6 12.0*  HCT 41.2 37.0*  PLT 183 189  MCV 86.7 86.2  MCH 28.6 28.0  MCHC 33.0 32.4  RDW 14.2 15.0  LYMPHSABS 1.0 0.7  MONOABS 0.9 0.6  EOSABS 0.0 0.2  BASOSABS 0.0 0.0   ------------------------------------------------------------------------------------------------------------------  Chemistries   Recent Labs Lab 12/08/13 0747 12/11/13 1140  NA 132* 131*  K 4.9 4.5  CL 94* 94*  CO2 17* 24  GLUCOSE 360* 275*  BUN 30* 31*  CREATININE 1.62* 1.43*  CALCIUM 9.4 9.4  AST  --  55*  ALT  --  91*  ALKPHOS  --  58  BILITOT  --  1.5*     Coagulation profile  Recent Labs Lab 12/08/13 1104 12/11/13 1140  INR 1.68* 2.06*  -------------------------------------------------------------------------------------------------------------------  Cardiac Enzymes  Recent Labs Lab 12/08/13 0747 12/11/13 1140  TROPONINI <0.30 <0.30   ------------------------------------------------------------------------------------------------------------------ No components found with this basename: POCBNP,    ---------------------------------------------------------------------------------------------------------------  Urinalysis    Component Value Date/Time   COLORURINE YELLOW 01/08/2009 [place] 01/08/2009 1722   LABSPEC 1.028 01/08/2009 1722   PHURINE 6.0 01/08/2009 1722   GLUCOSEU NEGATIVE 01/08/2009 [place] 01/08/2009 [place] 01/08/2009 1722   KETONESUR 15* 01/08/2009 [place] 01/08/2009 1722   UROBILINOGEN 1.0 01/08/2009 1722   NITRITE NEGATIVE  01/08/2009 1722    LEUKOCYTESUR NEGATIVE MICROSCOPIC NOT DONE ON URINES WITH NEGATIVE PROTEIN, BLOOD, LEUKOCYTES, NITRITE, OR GLUCOSE <1000 mg/dL. 01/08/2009 1722    ----------------------------------------------------------------------------------------------------------------  Imaging results:   Dg Chest 2 View  12/08/2013   CLINICAL DATA:  Short of breath and chest pain  EXAM: CHEST  2 VIEW  COMPARISON:  DG THORACIC SPINE 4V dated 11/24/2013; DG CHEST 1V PORT dated 10/29/2013  FINDINGS: Left-sided pacemaker overlies normal cardiac silhouette. There is a new bilateral peripheral airspace disease. No pleural fluid is evident. There is some increased central venous congestion. No pneumothorax.  IMPRESSION: New of peripheral airspace disease suggesting pulmonary edema. Atypical infection felt less likely.   Electronically Signed   By: Suzy Bouchard M.D.   On: 12/08/2013 08:46   Dg Thoracic Spine 4v  11/24/2013   CLINICAL DATA:  Fall from tree.  Upper back pain.  EXAM: THORACIC SPINE - 4+ VIEW  COMPARISON:  Two-view CHEST x-ray 07/30/2013  FINDINGS: Degenerative changes and spurring throughout the thoracic spine. Normal alignment. No fracture.  IMPRESSION: Degenerative changes.  No acute findings.   Electronically Signed   By: Rolm Baptise M.D.   On: 11/24/2013 15:32   Dg Chest Portable 1 View  12/11/2013   CLINICAL DATA:  Hypotension today. Influenza. History of congestive heart failure, myocardial infarction and prostate cancer.  EXAM: PORTABLE CHEST - 1 VIEW  COMPARISON:  12/08/2013 and 11/24/2013.  FINDINGS: 1039 hr. The left subclavian pacemaker/ AICD leads appear grossly unchanged within the right atrium, right ventricle and coronary sinus. Multiple telemetry leads overlie the chest. Cardiomegaly appears stable. There is vascular congestion with probable mild residual perihilar airspace disease on the right. The overall aeration of the lungs has improved over the last 3 days. There is no pleural effusion or  pneumothorax.  IMPRESSION: Improving airspace opacities with residual asymmetric component on the right, possibly due to asymmetric edema or atypical inflammation. No consolidation or significant pleural effusion.   Electronically Signed   By: Camie Patience M.D.   On: 12/11/2013 10:53    My personal review of EKG: Abnormal EKG. Has pacemaker in place the rhythm does not appear paced. Right bundle branch block    Assessment & Plan  Principal Problem:   Hypotension Active Problems:   ADENOCARCINOMA, RECTUM radiation/ chemo/ surg 2008   DIABETES MELLITUS   HYPERTENSION   MYOCARDIAL INFARCTION   CARDIOMYOPATHY, ISCHEMIC, with BiV ICD, st Jude EF 23%   PAF (paroxysmal atrial fibrillation)   Long term (current) use of anticoagulants   Dyslipidemia- (LDL 160, previously in the 50s)   Chronic systolic heart failure    CKD III    Shortness of breath with Acute viral syndrome  Will check influenza PCR. Wife has similar symptoms with shortness of breath and cough  Started on Tamiflu  Patient also started on doxycycline to cover possible atypical pneumonia  Gentle IV hydration  Admit to a telemetry bed  Ischemic cardiomyopathy with pacemaker. An EF approximately 30%  Currently appears dry  Will gently hydrate carefully monitoring for fluid overload.  BNP is usually in the 2000-3000s, therefore it is slightly elevated  Troponin x2 are within normal limits  Continue amiodarone, digoxin, Imdur, Ranexa  Cardiology consulted as patient has an abnormal EKG and complex cardiac history.  Hypotension with history of hypertension  Responding to IV fluids  Hold metoprolol, Enalapril, diuretics  Resume toprol 1/7 at 1/2 dose (100 mg rather than 200 mg ) with holding parameters  DM  Last Hgb A1c in 07/2013 was 11.1  Will check this admission.  Patient with decreased PO intake will place on 1/2 home dose lantus and SSI  Carb modified diet.  Hx of anal cancer  Patient  appears stable   No changes in bowel habits  Colostomy bag in place.  CKD III  Patient creatinine is between 1.22 - 1.5 at baseline.  Creatinine currently stable.  Hyponatremia  Secondary to poor po intake.  Gently hydrate and monitor.   DVT Prophylaxis on coumadin  AM Labs Ordered, also please review Full Orders  Family Communication:   Patient alert and orientated   Code Status:  full  Likely DC to  Home when appropriate  Condition:  Stable.  Time spent in minutes : 60    York, Bobby Rumpf PA-C on 12/11/2013 at 3:43 PM  Between 7am to 7pm - Pager - [phone]  After 7pm go to [email or web address] - password [email or web address]  And look for the night coverage person covering me after hours  [facility] Office  [phone]

## 2013-12-11 NOTE — ED Notes (Signed)
Pt reports being sick since Saturday w/ flu like symptoms, went to his doctor today and was told to come to the ed, because his bp was low and his heart rate was high.

## 2013-12-11 NOTE — ED Provider Notes (Addendum)
CSN: [id number]     Arrival date & time 12/11/13  1010 History  This chart was scribed for Maudry Diego, MD,  by Stacy Gardner, ED Scribe. The patient was seen in room [facility]08/[facility]08 and the patient's care was started at 10:49 AM.    First MD Initiated Contact with Patient 12/11/13 1017     Chief Complaint  Patient presents with  . Hypotension  . Influenza   (Consider location/radiation/quality/duration/timing/severity/associated sxs/prior Treatment) Patient is a 58 y.o. male presenting with flu symptoms. The history is provided by the patient and medical records. No language interpreter was used.  Influenza Presenting symptoms: cough, fatigue, rhinorrhea and shortness of breath   Presenting symptoms: no fever   Associated symptoms: chills    HPI Comments: [name] is a 58 y.o. male who presents to the Emergency Department complaining of influenza and hypotension. Pt states having influenza-like symptoms for the past four days. He was advised to go the ED for hypotension by Dr.Fusco at his doctor appointment today.  He as the associated symptoms of SOB, cough, and fatigue. Pt states when he gets under his blanket he has chills.  He states his SOB has mildly improved. He denies fever. He had a flu shot. Pt's Lasix rx was increased from taking 20 mg to 20 mg of Lasix twice a day .    Past Medical History  Diagnosis Date  . Essential hypertension, benign   . Type 2 diabetes mellitus   . Coronary atherosclerosis of native coronary artery     BMS to LAD 2001 at [facility], PTCA/atherectomy ramus and BMS to LAD 2009  . S/P colonoscopy     Normal via ostomy - September 2009  . Chronic systolic heart failure   . Myocardial infarction, anterior wall     Treated with tPA at [facility] 2000  . GERD (gastroesophageal reflux disease)   . Cardiomyopathy, ischemic     BIV ICD St. Jude, LVEF 23%  . Paroxysmal atrial fibrillation     Amiodarone and Coumadin  . Adenocarcinoma of rectum     October  2008  . Prostate cancer   . 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 Internal defibrillator and pacemaker  2010    x2 St. Jude device  . Abdominal and perineal resection of rectum with total mesorectal excision      10/04/2007  . Colonoscopy      05/17/2007. IMPRESSION: Semilunar, apple-core neoplasm low in the rectum (palpable on digital rectal exam) beginning at 5 cm and corkscrewing up 5 cm in length. This was a low rectal lesion consistent with colorectal carcinoma. It was biosied multiple times. The upstream colon all the way to the cecum appeared normal. Recommendations: Followup on path. Surgical Consultation   . Colonoscopy  09/14/2011    Procedure: COLONOSCOPY;  Surgeon: Daneil Dolin, MD;  Location: [facility];  Service: Endoscopy;  Laterality: N/A;  8:30- TCS via colostomy & pt has defibrillator  . Colostomy     Family History  Problem Relation Age of Onset  . Colon cancer Mother 16  . Colon cancer Sister 38  . Coronary artery disease Father    History  Substance Use Topics  . Smoking status: Never Smoker   . Smokeless tobacco: Never Used  . Alcohol Use: No     Comment: Former user    Review of Systems  Constitutional: Positive for chills and fatigue. Negative for  fever.  HENT: Positive for rhinorrhea.   Respiratory: Positive for cough and shortness of breath.   All other systems reviewed and are negative.    Allergies  Review of patient's allergies indicates no known allergies.  Home Medications   Current Outpatient Rx  Name  Route  Sig  Dispense  Refill  . amiodarone (PACERONE) 200 MG tablet   Oral   Take 1 tablet (200 mg total) by mouth 2 (two) times daily.   60 tablet   1   . aspirin 81 MG EC tablet   Oral   Take 81 mg by mouth at bedtime.          . citalopram (CELEXA) 20 MG tablet   Oral   Take 1 tablet (20 mg total) by mouth daily.   30 tablet   2   . co-enzyme  Q-10 50 MG capsule   Oral   Take 50 mg by mouth every morning.          Marland Kitchen COUMADIN 2.5 MG tablet   Oral   Take 2.5 mg by mouth every evening.          Marland Kitchen CRESTOR 20 MG tablet   Oral   Take 20 mg by mouth at bedtime.          . digoxin (LANOXIN) 0.125 MG tablet   Oral   Take 0.125 mg by mouth daily.         . enalapril (VASOTEC) 10 MG tablet   Oral   Take 10 mg by mouth 2 (two) times daily.          . fluticasone (FLONASE) 50 MCG/ACT nasal spray   Each Nare   Place 1 spray into both nostrils daily.         . furosemide (LASIX) 20 MG tablet   Oral   Take 1 tablet (20 mg total) by mouth 2 (two) times daily.   60 tablet   0   . insulin aspart (NOVOLOG) 100 UNIT/ML injection   Subcutaneous   Inject 12-14 Units into the skin 3 (three) times daily before meals. 14 units at breakfast and 12 units a lunch and 12 units at supper per sliding scale          . insulin glargine (LANTUS) 100 UNIT/ML injection   Subcutaneous   Inject 24 Units into the skin at bedtime.           . isosorbide mononitrate (IMDUR) 30 MG 24 hr tablet   Oral   Take 15 mg by mouth 2 (two) times daily.         . metFORMIN (GLUCOPHAGE) 500 MG tablet   Oral   Take 500 mg by mouth 2 (two) times daily with a meal.         . metoprolol (TOPROL-XL) 200 MG 24 hr tablet   Oral   Take 200 mg by mouth daily.         . niacin (NIASPAN) 1000 MG CR tablet   Oral   Take 1,000 mg by mouth at bedtime.           . nitroGLYCERIN (NITROLINGUAL) 0.4 MG/SPRAY spray   Sublingual   Place 1 spray under the tongue every 5 (five) minutes as needed. angina         . Omega-3 Fatty Acids (FISH OIL) 1200 MG CAPS   Oral   Take 1,200 mg by mouth 2 (two) times daily.           Marland Kitchen  oxyCODONE-acetaminophen (PERCOCET) 5-325 MG per tablet   Oral   Take 1 tablet by mouth every 4 (four) hours as needed.   20 tablet   0   . pantoprazole (PROTONIX) 40 MG tablet   Oral   Take 1 tablet (40 mg total) by  mouth daily.   30 tablet   6   . ranolazine (RANEXA) 1000 MG SR tablet   Oral   Take 1,000 mg by mouth 2 (two) times daily.         Marland Kitchen spironolactone (ALDACTONE) 25 MG tablet   Oral   Take 25 mg by mouth daily.           BP 102/78  Pulse 98  Temp(Src) 97.7 F (36.5 C) (Oral)  Ht 5\' 10"  (1.778 m)  Wt 214 lb (97.07 kg)  BMI 30.71 kg/m2  SpO2 98% Physical Exam  Nursing note and vitals reviewed. Constitutional: He appears well-developed and well-nourished. No distress.  HENT:  Head: Normocephalic and atraumatic.  Right Ear: External ear normal.  Left Ear: External ear normal.  Eyes: Conjunctivae are normal. Right eye exhibits no discharge. Left eye exhibits no discharge. No scleral icterus.  Neck: Neck supple. No tracheal deviation present.  Pulmonary/Chest: Effort normal. No stridor. No respiratory distress. He has no wheezes. He has no rales.  Musculoskeletal: He exhibits no edema.  Neurological: He is alert. Cranial nerve deficit: no gross deficits.  Skin: Skin is warm and dry. No rash noted.  Psychiatric: He has a normal mood and affect.    ED Course  Procedures (including critical care time) DIAGNOSTIC STUDIES: Oxygen Saturation is 98% on room air, normal by my interpretation.    COORDINATION OF CARE:  10:52 AM Discussed course of care with pt which includes EKG and laboratory tests. Pt understands and agrees.    Labs Review Labs Reviewed  CBC WITH DIFFERENTIAL  COMPREHENSIVE METABOLIC PANEL  PROTIME-INR  DIGOXIN LEVEL  PRO B NATRIURETIC PEPTIDE  TROPONIN I   Imaging Review No results found.  EKG Interpretation    Date/Time:  Tuesday December 11 2013 10:13:17 EST Ventricular Rate:  91 PR Interval:    QRS Duration: 198 QT Interval:  484 QTC Calculation: 595 R Axis:   -96 Text Interpretation:  Undetermined rhythm Right bundle branch block Inferior infarct , age undetermined Anterolateral infarct , age undetermined Abnormal ECG When compared with  ECG of 08-Dec-2013 07:09, Current undetermined rhythm precludes rhythm comparison, needs review Confirmed by Mical Brun  MD, Ashanti Littles ([id number]) on 12/11/2013 2:34:02 PM          CRITICAL CARE Performed by: Desare Duddy L Total critical care time: 35 Critical care time was exclusive of separately billable procedures and treating other patients. Critical care was necessary to treat or prevent imminent or life-threatening deterioration. Critical care was time spent personally by me on the following activities: development of treatment plan with patient and/or surrogate as well as nursing, discussions with consultants, evaluation of patient's response to treatment, examination of patient, obtaining history from patient or surrogate, ordering and performing treatments and interventions, ordering and review of laboratory studies, ordering and review of radiographic studies, pulse oximetry and re-evaluation of patient's condition.   MDM  Admit,  chf and viral syndrome   Maudry Diego, MD 12/11/13 [place], MD 12/11/13 1435

## 2013-12-12 ENCOUNTER — Encounter (HOSPITAL_COMMUNITY): Payer: Self-pay | Admitting: Internal Medicine

## 2013-12-12 DIAGNOSIS — I5022 Chronic systolic (congestive) heart failure: Secondary | ICD-10-CM | POA: Diagnosis not present

## 2013-12-12 DIAGNOSIS — E669 Obesity, unspecified: Secondary | ICD-10-CM | POA: Diagnosis present

## 2013-12-12 DIAGNOSIS — R740 Nonspecific elevation of levels of transaminase and lactic acid dehydrogenase [LDH]: Secondary | ICD-10-CM

## 2013-12-12 DIAGNOSIS — I959 Hypotension, unspecified: Secondary | ICD-10-CM | POA: Diagnosis not present

## 2013-12-12 DIAGNOSIS — I4891 Unspecified atrial fibrillation: Secondary | ICD-10-CM | POA: Diagnosis not present

## 2013-12-12 DIAGNOSIS — J111 Influenza due to unidentified influenza virus with other respiratory manifestations: Secondary | ICD-10-CM | POA: Diagnosis not present

## 2013-12-12 DIAGNOSIS — E861 Hypovolemia: Secondary | ICD-10-CM | POA: Diagnosis not present

## 2013-12-12 DIAGNOSIS — J189 Pneumonia, unspecified organism: Principal | ICD-10-CM

## 2013-12-12 DIAGNOSIS — R7401 Elevation of levels of liver transaminase levels: Secondary | ICD-10-CM | POA: Diagnosis present

## 2013-12-12 DIAGNOSIS — I2510 Atherosclerotic heart disease of native coronary artery without angina pectoris: Secondary | ICD-10-CM | POA: Diagnosis not present

## 2013-12-12 LAB — CBC
HCT: 36.5 % — ABNORMAL LOW (ref 39.0–52.0)
HEMOGLOBIN: 11.8 g/dL — AB (ref 13.0–17.0)
MCH: 27.7 pg (ref 26.0–34.0)
MCHC: 32.3 g/dL (ref 30.0–36.0)
MCV: 85.7 fL (ref 78.0–100.0)
Platelets: 198 10*3/uL (ref 150–400)
RBC: 4.26 MIL/uL (ref 4.22–5.81)
RDW: 15.2 % (ref 11.5–15.5)
WBC: 6.5 10*3/uL (ref 4.0–10.5)

## 2013-12-12 LAB — GLUCOSE, CAPILLARY
GLUCOSE-CAPILLARY: 137 mg/dL — AB (ref 70–99)
GLUCOSE-CAPILLARY: 186 mg/dL — AB (ref 70–99)
Glucose-Capillary: 169 mg/dL — ABNORMAL HIGH (ref 70–99)
Glucose-Capillary: 232 mg/dL — ABNORMAL HIGH (ref 70–99)

## 2013-12-12 LAB — BASIC METABOLIC PANEL
BUN: 22 mg/dL (ref 6–23)
CO2: 24 mEq/L (ref 19–32)
Calcium: 8.7 mg/dL (ref 8.4–10.5)
Chloride: 99 mEq/L (ref 96–112)
Creatinine, Ser: 1.17 mg/dL (ref 0.50–1.35)
GFR calc Af Amer: 77 mL/min — ABNORMAL LOW (ref >90)
GFR, EST NON AFRICAN AMERICAN: 67 mL/min — AB (ref >90)
GLUCOSE: 126 mg/dL — AB (ref 70–99)
Potassium: 3.5 mEq/L — ABNORMAL LOW (ref 3.7–5.3)
Sodium: 134 mEq/L — ABNORMAL LOW (ref 137–147)

## 2013-12-12 LAB — PROTIME-INR
INR: 2.45 — ABNORMAL HIGH (ref 0.00–1.49)
Prothrombin Time: 25.8 seconds — ABNORMAL HIGH (ref 11.6–15.2)

## 2013-12-12 LAB — HEMOGLOBIN A1C
Hgb A1c MFr Bld: 10.3 % — ABNORMAL HIGH (ref <5.7)
Mean Plasma Glucose: 249 mg/dL — ABNORMAL HIGH (ref <117)

## 2013-12-12 MED ORDER — BENZONATATE 100 MG PO CAPS
100.0000 mg | ORAL_CAPSULE | Freq: Three times a day (TID) | ORAL | Status: DC
  Administered 2013-12-12 – 2013-12-13 (×4): 100 mg via ORAL
  Filled 2013-12-12 (×4): qty 1

## 2013-12-12 MED ORDER — POTASSIUM CHLORIDE CRYS ER 20 MEQ PO TBCR: 30.0000 meq | EXTENDED_RELEASE_TABLET | Freq: Two times a day (BID) | ORAL | Status: DC

## 2013-12-12 MED ORDER — LEVALBUTEROL HCL 0.63 MG/3ML IN NEBU
0.6300 mg | INHALATION_SOLUTION | Freq: Two times a day (BID) | RESPIRATORY_TRACT | Status: DC
  Administered 2013-12-13: 0.63 mg via RESPIRATORY_TRACT
  Filled 2013-12-12: qty 3

## 2013-12-12 MED ORDER — WARFARIN SODIUM 2 MG PO TABS
2.0000 mg | ORAL_TABLET | Freq: Once | ORAL | Status: AC
  Administered 2013-12-12: 2 mg via ORAL
  Filled 2013-12-12: qty 1

## 2013-12-12 MED ORDER — POTASSIUM CHLORIDE CRYS ER 20 MEQ PO TBCR
20.0000 meq | EXTENDED_RELEASE_TABLET | Freq: Two times a day (BID) | ORAL | Status: AC
  Administered 2013-12-12: 20 meq via ORAL
  Filled 2013-12-12: qty 1

## 2013-12-12 MED ORDER — LEVALBUTEROL HCL 0.63 MG/3ML IN NEBU
0.6300 mg | INHALATION_SOLUTION | Freq: Four times a day (QID) | RESPIRATORY_TRACT | Status: DC
  Administered 2013-12-12 (×2): 0.63 mg via RESPIRATORY_TRACT
  Filled 2013-12-12 (×2): qty 3

## 2013-12-12 MED ORDER — HYDROCOD POLST-CHLORPHEN POLST 10-8 MG/5ML PO LQCR: 5.0000 mL | Freq: Two times a day (BID) | ORAL | Status: DC | PRN

## 2013-12-12 MED ORDER — METFORMIN HCL 500 MG PO TABS
500.0000 mg | ORAL_TABLET | Freq: Two times a day (BID) | ORAL | Status: DC
  Administered 2013-12-13: 500 mg via ORAL
  Filled 2013-12-12: qty 1

## 2013-12-12 NOTE — Progress Notes (Signed)
Nutrition Brief Note  Patient identified on the Malnutrition Screening Tool (MST) Report  Wt Readings from Last 15 Encounters:  12/11/13 214 lb (97.07 kg)  12/08/13 218 lb 3 oz (98.969 kg)  11/24/13 210 lb (95.255 kg)  11/08/13 224 lb 8 oz (101.833 kg)  10/29/13 224 lb (101.606 kg)  10/25/13 219 lb 9.3 oz (99.6 kg)  08/09/13 228 lb 4 oz (103.534 kg)  08/01/13 229 lb (103.874 kg)  07/17/13 228 lb 3.2 oz (103.511 kg)  07/13/13 226 lb 8 oz (102.74 kg)  07/13/13 226 lb 8 oz (102.74 kg)  04/17/13 235 lb 14.4 oz (107.004 kg)  05/19/12 223 lb 6.4 oz (101.334 kg)  11/09/11 235 lb 11.2 oz (106.913 kg)  09/14/11 229 lb (103.874 kg)    Body mass index is 30.71 kg/(m^2). Patient meets criteria for obesity class I based on current BMI. Pt denies significant changes in weight which is also reflected in his hx noted above.  Current diet order is Heart Healthy. Reports poor po intake prior to admission due to shortness of breath but follows on to say, he ate well at breakfast without breathing difficulty. Declined nutrition supplement. Labs and medications reviewed. Will monitor his  intake peripherally.  No nutrition interventions warranted at this time. If nutrition issues arise, please consult RD.   Colman Cater MS,RD,CSG,LDN Office: [phone] Pager: [phone]

## 2013-12-12 NOTE — Consult Note (Signed)
CARDIOLOGY CONSULT NOTE   Patient ID: [name] MRN: [id number] DOB/AGE: 1956-10-13 58 y.o.  Admit Date: 12/11/2013 Referring Physician: PTH Primary Physician: Cassell Smiles., MD Consulting Cardiologist: Dina Rich MD Primary Cardiologist: Nanetta Batty MD Reason for Consultation: CHF  Clinical Summary [name] is a 59 y.o.male admitted with shortness of breath and anorexia. He has told medical issues to include colon cancer, with chemotherapy and radiation, ischemic cardiomyopathy with an EF of 30% per echo in September 2014, status post pacemaker implantation (S/P ICD Promote RF 3207 ICD, model [id number] 2009), paroxysmal atrial fibrillation on chronic Coumadin, CAD, with stenting of the LAD in 2002, with most recent cardiac catheterization in August of 2014 revealing patent LAD stent and segmental disease in his diagonal [name] which was not revascularizable,and diabetes.     Patient states he was in the emergency room 2 days earlier with complaints of a shortness of breath but without lower extremity edema or weight gain. He was treated with IV Lasix and sent home. The patient's breathing status did not improve, and he and he had no increase in urination. He then began to feel more worn out and short of breath prompting return to ER. He was found to be hyponatremic with a sodium of 131, chloride 94, glucose 275 BUN 31 creatinine 1.43. His elevated liver enzymes with AST of 55 and ALT of 91. Bili total is 1.5. Pro BNP 3985. Troponins were found to be negative x2. Chest x-ray demonstrated asymmetric component on the right with airspace of PACs possibly due to asymmetric edema or atypical inflammation.    He was treated with IV fluids. Begun on Tamiflu. Followup labs were negative for influenza. Due to multiple cardiac issues, we are asked for recommendations. He is breathing and feeling much better.      No Known Allergies  Medications Scheduled Medications: . amiodarone   200 mg Oral BID  . aspirin EC  81 mg Oral QHS  . atorvastatin  40 mg Oral q1800  . citalopram  20 mg Oral Daily  . digoxin  0.125 mg Oral Q48H  . doxycycline (VIBRAMYCIN) IV  100 mg Intravenous Q12H  . fluticasone  1 spray Each Nare Daily  . insulin aspart  0-15 Units Subcutaneous TID WC  . insulin glargine  12 Units Subcutaneous QHS  . metoprolol succinate  100 mg Oral Daily  . niacin  1,000 mg Oral QHS  . omega-3 acid ethyl esters  1 g Oral BID  . oseltamivir  75 mg Oral BID  . pantoprazole  40 mg Oral Daily  . ranolazine  1,000 mg Oral BID  . sodium chloride  3 mL Intravenous Q12H  . Warfarin - Pharmacist Dosing Inpatient   Does not apply Q24H    Infusions: . sodium chloride 75 mL/hr at 12/11/13 1451    PRN Medications: acetaminophen, acetaminophen, HYDROcodone-acetaminophen, levalbuterol, morphine injection, nitroGLYCERIN, ondansetron (ZOFRAN) IV, ondansetron   Past Medical History  Diagnosis Date  . Essential hypertension, benign   . Type 2 diabetes mellitus   . Coronary atherosclerosis of native coronary artery     BMS to LAD 2001 at [facility], PTCA/atherectomy ramus and BMS to LAD 2009  . S/P colonoscopy     Normal via ostomy - September 2009  . Chronic systolic heart failure   . Myocardial infarction, anterior wall     Treated with tPA at [facility] 2000  . GERD (gastroesophageal reflux disease)   . Cardiomyopathy, ischemic     BIV  ICD St. Jude, LVEF 23%  . Paroxysmal atrial fibrillation     Amiodarone and Coumadin  . 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 Prostate cancer   . 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 Internal defibrillator and pacemaker  2010    x2 St. Jude device  . Abdominal and perineal resection of rectum with total mesorectal excision      10/04/2007  . Colonoscopy      05/17/2007. IMPRESSION: Semilunar, apple-core neoplasm low in the rectum (palpable on digital  rectal exam) beginning at 5 cm and corkscrewing up 5 cm in length. This was a low rectal lesion consistent with colorectal carcinoma. It was biosied multiple times. The upstream colon all the way to the cecum appeared normal. Recommendations: Followup on path. Surgical Consultation   . Colonoscopy  09/14/2011    Procedure: COLONOSCOPY;  Surgeon: Corbin Ade, MD;  Location: [facility];  Service: Endoscopy;  Laterality: N/A;  8:30- TCS via colostomy & pt has defibrillator  . Colostomy      Family History  Problem Relation Age of Onset  . Colon cancer Mother 52  . Colon cancer Sister 92  . Coronary artery disease Father     Social History [name] reports that he has never smoked. He has never used smokeless tobacco. [name] reports that he does not drink alcohol.  Review of Systems Otherwise reviewed and negative except as outlined.  Physical Examination Blood pressure 90/63, pulse 105, temperature 97.5 F (36.4 C), temperature source Oral, resp. rate 20, height 5\' 10"  (1.778 m), weight 214 lb (97.07 kg), SpO2 95.00%.  Intake/Output Summary (Last 24 hours) at 12/12/13 0916 Last data filed at 12/12/13 0420  Gross per 24 hour  Intake      0 ml  Output    700 ml  Net   -700 ml    HEENT: Conjunctiva and lids normal, oropharynx clear with moist mucosa. Neck: Supple, no elevated JVP or carotid bruits, no thyromegaly. Lungs: Clear to auscultation, nonlabored breathing at rest. Cardiac: Regular rate and rhythm, no S3 or significant systolic murmur, no pericardial rub. Abdomen: Soft, nontender, no hepatomegaly, bowel sounds present, no guarding or rebound. Extremities: No pitting edema, distal pulses 2+. Skin: Warm and dry. Musculoskeletal: No kyphosis. Neuropsychiatric: Alert and oriented x3, affect grossly appropriate.  Prior Cardiac Testing/Procedures 1. Cardiac Cath 07/2013 1. Left main; normal  2. LAD; the entire proximal third of the LAD was fluoroscopically  calcified. The proximal third had approximately 50-60% segmental stenosis. The stent was widely patent in the proximal third of the LAD with 30-40% in-stent restenosis". There was a moderate size first diagonal Anjuli Gemmill and the ramus distribution that was occluded in its proximal portion and filled by collaterals. This was noted to be highly diseased at his last cath in 2009. There was 50-60% segmental stenosis in the middle third and 70% in the distal/apical third. There were 2 small marginal branches arising from the middle third that had 90% ostial stenoses unchanged from prior cath  3. Left circumflex; dominant with 99% long segmental proximal OM1 stenosis in a small to medium-size vessel unchanged from prior cath  4. Right coronary artery; nondominant with 50% mid and 80% distal stenosis unchanged from prior cath  5. Left ventriculography; not performed today to conserve contrast  Lab Results  Basic Metabolic Panel:  Recent Labs Lab 12/08/13 0747 12/11/13 1140 12/12/13  0555  NA 132* 131* 134*  K 4.9 4.5 3.5*  CL 94* 94* 99  CO2 17* 24 24  GLUCOSE 360* 275* 126*  BUN 30* 31* 22  CREATININE 1.62* 1.43* 1.17  CALCIUM 9.4 9.4 8.7    Liver Function Tests:  Recent Labs Lab 12/11/13 1140  AST 55*  ALT 91*  ALKPHOS 58  BILITOT 1.5*  PROT 7.3  ALBUMIN 2.7*    CBC:  Recent Labs Lab 12/08/13 0747 12/11/13 1140 12/12/13 0555  WBC 14.2* 8.8 6.5  NEUTROABS 12.3* 7.2  --   HGB 13.6 12.0* 11.8*  HCT 41.2 37.0* 36.5*  MCV 86.7 86.2 85.7  PLT 183 189 198    Cardiac Enzymes:  Recent Labs Lab 12/08/13 0747 12/11/13 1140  TROPONINI <0.30 <0.30    Radiology: Dg Chest Portable 1 View  12/11/2013   CLINICAL DATA:  Hypotension today. Influenza. History of congestive heart failure, myocardial infarction and prostate cancer.  EXAM: PORTABLE CHEST - 1 VIEW  COMPARISON:  12/08/2013 and 11/24/2013.  FINDINGS: 1039 hr. The left subclavian pacemaker/ AICD leads appear grossly  unchanged within the right atrium, right ventricle and coronary sinus. Multiple telemetry leads overlie the chest. Cardiomegaly appears stable. There is vascular congestion with probable mild residual perihilar airspace disease on the right. The overall aeration of the lungs has improved over the last 3 days. There is no pleural effusion or pneumothorax.  IMPRESSION: Improving airspace opacities with residual asymmetric component on the right, possibly due to asymmetric edema or atypical inflammation. No consolidation or significant pleural effusion.   Electronically Signed   By: Roxy Horseman M.D.   On: 12/11/2013 10:53     WGN:FAOZHY fib with paced rhythm.    Impression and Recommendations 1. Dyspnea: Does not appear to be related to CHF with significant improvement in breathing status with IV hydration and O2 support. CXR demonstrating asymmetrical edema. Afebrile and negative for influenza. Wt is essentially the same, but he states he is losing wt.  On amiodarone, but doubt related to lung toxicity at this time. Will discuss with Dr.Matvey Llanas.  2. Ischemic CM: EF of 30-35% with management using BB, Digoxin, spironolactone and lasix at home. Not on spironolactone or lasix here, due to renal insufficiency and possible dehydration. Heart rate is not well controlled currently, BP is soft, but normal for systolic dysfunction.   3. CAD: Most recent cardiac cath in 07/2013 with no ISR, segmental disease in his diagonal Erian Rosengren which was non-revascularizable. His RCA was nondominant. Medical therapy was recommended. He is also followed by a Dr. Elayne Guerin in [facility] who is also managing systolic dysfunction. Continue ASA and statin. Her was last seen by Dr. Allyson Sabal December 2014.  4. Atrial fibrillation: HR is mildly elevated in the setting of dehydration and likely infecition. He remains on coumadin with pharmacy following. INR 2.5 this am.   5. Elevated Liver Enzymes: Slightly elevated, likely related to  dehydration. May need to have further follow up if trending upward. Will repeat in am with hydration. He is on amiodarone, if remain elevated, may consider discontinuing.     Signed: Bettey Mare. [place] NP [facility] 12/12/2013, 9:16 AM Co-Sign MD 58 yo male history of ICM LVEF 30% by echo 08/2013, NYHA III with BiV AICD, afib on chronic anticoagulation admitted with SOB. Patient reports SOB at rest and with exertion, as well as a mildly productive cough with brown sputum starting this past Friday. Reports his wife has had similar symptoms.  Denies any chest pain, palpitations, or edema. Does note some orthopnea over this time period. Was seen initialyl for this and diuretics were intensified without improvement of his symptoms. On presentation by exam he appears hypovolemic, his Na, Cr, and BUN showed abnormalities that improved with gentler IVF consistent with hypovolemia. His blood pressure has also improved. EKG shows afib with RBBB and intermittent ventricular pacing, troponins negative. CXR shows asymmetic interstitial disease, atypical pneumonia vs edema, BNP 3985 (around his baseline). Overall SOB presentation consistent with URI and not decompensated heart failure, agree with abx for atypical pneumonia. From afib standpoint he is rate controlled and appropriate anticoagulated. His amiodarone from prior [facility] cardiology notes seems to be more for episodes of ventricular arrythmias with prior ICD shocks as opposed to afib. 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 Agree with holding ACE-I for the time being in setting of AKI and low blood pressures, agree with placing patient on half of his home Toprol XL dose for now, follow vitals and will titrate back as tolerable. Continue gentle hydration with holding of diuretics, I think he is nearing euvolemia  and would consider stopping IVF later this afternoon.    Dina Rich MD

## 2013-12-12 NOTE — Progress Notes (Addendum)
Spoke with patient about diabetes and outpatient regimen for diabetes management.  Currently as an outpatient, patient reports that he takes Lantus 24 units QHS,  Novolog sliding scale TID (he was started on the sliding scale about 3 weeks ago and he thinks it ranges from 10-15 units based on blood glucose), and Metformin 500 mg BID.  Patient reports compliance with medications and monitoring CBGs.  He states that he checks his blood glucose 3-4 times a day and it generally ranges from 130-170's mg/dl.  Inquired about low blood glucose and patient reports that to his knowledge he has never experienced a low blood sugar.  Patient's PCP (Dr. Gerarda Fraction) assists patient with managing his diabetes.  He reports that he recently talked with Dr. Gerarda Fraction about seeing an endocrinologist but does not have an appointment set up with one yet.  Informed patient about local endocrinologist, Dr. Dorris Fetch.  Patient reports that other people have mentioned Dr. Dorris Fetch to him.  Encourage patient to set up an initial appointment with Dr. Dorris Fetch so he could get diabetes under good control.  Discussed A1C results (10.3% on 12/11/13) and patient reports that his last A1C was in the 9% range.  Discussed importance of checking CBGs and maintaining good CBG control to prevent long-term and short-term complications. Patient verbalized understanding of information discussed and reports that he does not have any further questions at this time related to diabetes.    Thanks, Barnie Alderman, RN, MSN, CCRN Diabetes Coordinator Inpatient Diabetes Program [phone] (Team Pager) [phone] ([facility] office) [phone] [facility] office)

## 2013-12-12 NOTE — Progress Notes (Signed)
[place] for Warfarin Indication: atrial fibrillation  No Known Allergies  Patient Measurements: Height: 5\' 10"  (177.8 cm) Weight: 214 lb (97.07 kg) IBW/kg (Calculated) : 73  Vital Signs: Temp: 97.5 F (36.4 C) (01/07 0419) Temp src: Oral (01/07 0419) BP: 90/63 mmHg (01/07 0419) Pulse Rate: 105 (01/07 0419)  Labs:  Recent Labs  12/11/13 1140 12/12/13 0555  HGB 12.0* 11.8*  HCT 37.0* 36.5*  PLT 189 198  LABPROT 22.6* 25.8*  INR 2.06* 2.45*  CREATININE 1.43* 1.17  TROPONINI <0.30  --     Estimated Creatinine Clearance: 79.4 ml/min (by C-G formula based on Cr of 1.17).   Medical History: Past Medical History  Diagnosis Date  . Essential hypertension, benign   . Type 2 diabetes mellitus   . Coronary atherosclerosis of native coronary artery     BMS to LAD 2001 at [facility], PTCA/atherectomy ramus and BMS to LAD 2009  . S/P colonoscopy     Normal via ostomy - September 2009  . Chronic systolic heart failure   . Myocardial infarction, anterior wall     Treated with tPA at [facility] 2000  . GERD (gastroesophageal reflux disease)   . Cardiomyopathy, ischemic     BIV ICD St. Jude, LVEF 23%  . Paroxysmal atrial fibrillation     Amiodarone and Coumadin  . 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 Prostate cancer   . 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 amiodarone  200 mg Oral BID  . aspirin EC  81 mg Oral QHS  . atorvastatin  40 mg Oral q1800  . citalopram  20 mg Oral Daily  . digoxin  0.125 mg Oral Q48H  . doxycycline (VIBRAMYCIN) IV  100 mg Intravenous Q12H  . fluticasone  1 spray Each Nare Daily  . insulin aspart  0-15 Units Subcutaneous TID WC  . insulin glargine  12 Units Subcutaneous QHS  . metoprolol succinate  100 mg Oral Daily  . niacin  1,000 mg Oral QHS  . omega-3 acid ethyl esters  1 g Oral BID  . oseltamivir  75 mg Oral BID  . pantoprazole  40 mg Oral Daily   . ranolazine  1,000 mg Oral BID  . sodium chloride  3 mL Intravenous Q12H  . Warfarin - Pharmacist Dosing Inpatient   Does not apply Q24H   Assessment: Admitted for flu symptoms and hypotension Continuation of warfarin for atrial fibrillation PTA Warfarin 2.5 mg po daily INR therapeutic, but trending up.  Goal of Therapy:  INR 2-3   Plan:  Warfarin 2 mg po x 1 dose today  INR/PT daily  Pricilla Larsson 12/12/2013,9:23 AM

## 2013-12-12 NOTE — Progress Notes (Signed)
Utilization Review Complete  

## 2013-12-12 NOTE — Progress Notes (Signed)
[facility] PROGRESS NOTE  [name] [id number] DOB: 1956-04-11 DOA: 12/11/2013 PCP: Glo Herring., MD    Code Status: Full code Family Communication: Family not available Disposition Plan: Anticipate discharge to home in the next 48 hours.   Consultants:  [facility] Heckler cardiology  Procedures:  None  Antibiotics:  IV doxycycline 12/11/2013  Tamiflu 12/11/2013.  HPI/Subjective: The patient feels better, but he has a nagging cough with brownish sputum. He denies chest pain or pleurisy.  Objective: Filed Vitals:   12/12/13 0419  BP: 90/63  Pulse: 105  Temp: 97.5 F (36.4 C)  Resp: 20    Intake/Output Summary (Last 24 hours) at 12/12/13 1416 Last data filed at 12/12/13 0420  Gross per 24 hour  Intake      0 ml  Output    700 ml  Net   -700 ml   Filed Weights   12/11/13 1019  Weight: 97.07 kg (214 lb)    Exam:   General:  Pleasant 58 year old man in no acute distress.  Cardiovascular: Irregular, irregular.  Respiratory: Occasional wheezes auscultated between coughs.  Abdomen: Positive bowel sounds, soft, nontender, nondistended.  Musculoskeletal: No pedal edema. No acute hot red joints.  Neurologic: He is alert and oriented x3. Cranial nerves II through XII are intact.  Psychiatric: Pleasant affect. Speech is clear.  Data Reviewed: Basic Metabolic Panel:  Recent Labs Lab 12/08/13 0747 12/11/13 1140 12/12/13 0555  NA 132* 131* 134*  K 4.9 4.5 3.5*  CL 94* 94* 99  CO2 17* 24 24  GLUCOSE 360* 275* 126*  BUN 30* 31* 22  CREATININE 1.62* 1.43* 1.17  CALCIUM 9.4 9.4 8.7   Liver Function Tests:  Recent Labs Lab 12/11/13 1140  AST 55*  ALT 91*  ALKPHOS 58  BILITOT 1.5*  PROT 7.3  ALBUMIN 2.7*   No results found for this basename: LIPASE, AMYLASE,  in the last 168 hours No results found for this basename: AMMONIA,  in the last 168 hours CBC:  Recent Labs Lab 12/08/13 0747 12/11/13 1140 12/12/13 0555  WBC 14.2* 8.8 6.5   NEUTROABS 12.3* 7.2  --   HGB 13.6 12.0* 11.8*  HCT 41.2 37.0* 36.5*  MCV 86.7 86.2 85.7  PLT 183 189 198   Cardiac Enzymes:  Recent Labs Lab 12/08/13 0747 12/11/13 1140  TROPONINI <0.30 <0.30   BNP (last 3 results)  Recent Labs  10/29/13 1629 12/08/13 0747 12/11/13 1140  PROBNP 3874.0* 7155.0* 3985.0*   CBG:  Recent Labs Lab 12/11/13 1752 12/11/13 2034 12/12/13 0746 12/12/13 1147  GLUCAP 178* 193* 137* 186*    No results found for this or any previous visit (from the past 240 hour(s)).   Studies: Dg Chest Portable 1 View  12/11/2013   CLINICAL DATA:  Hypotension today. Influenza. History of congestive heart failure, myocardial infarction and prostate cancer.  EXAM: PORTABLE CHEST - 1 VIEW  COMPARISON:  12/08/2013 and 11/24/2013.  FINDINGS: 1039 hr. The left subclavian pacemaker/ AICD leads appear grossly unchanged within the right atrium, right ventricle and coronary sinus. Multiple telemetry leads overlie the chest. Cardiomegaly appears stable. There is vascular congestion with probable mild residual perihilar airspace disease on the right. The overall aeration of the lungs has improved over the last 3 days. There is no pleural effusion or pneumothorax.  IMPRESSION: Improving airspace opacities with residual asymmetric component on the right, possibly due to asymmetric edema or atypical inflammation. No consolidation or significant pleural effusion.   Electronically Signed   By: Rush Landmark  Lin Landsman M.D.   On: 12/11/2013 10:53    Scheduled Meds: . amiodarone  200 mg Oral BID  . aspirin EC  81 mg Oral QHS  . atorvastatin  40 mg Oral q1800  . benzonatate  100 mg Oral TID  . citalopram  20 mg Oral Daily  . digoxin  0.125 mg Oral Q48H  . doxycycline (VIBRAMYCIN) IV  100 mg Intravenous Q12H  . fluticasone  1 spray Each Nare Daily  . insulin aspart  0-15 Units Subcutaneous TID WC  . insulin glargine  12 Units Subcutaneous QHS  . levalbuterol  0.63 mg Nebulization Q6H  .  metoprolol succinate  100 mg Oral Daily  . niacin  1,000 mg Oral QHS  . omega-3 acid ethyl esters  1 g Oral BID  . oseltamivir  75 mg Oral BID  . pantoprazole  40 mg Oral Daily  . ranolazine  1,000 mg Oral BID  . sodium chloride  3 mL Intravenous Q12H  . warfarin  2 mg Oral Once  . Warfarin - Pharmacist Dosing Inpatient   Does not apply Q24H   Continuous Infusions: . sodium chloride 75 mL/hr at 12/11/13 1451    Assessment:  Principal Problem:   Influenza-like illness Active Problems:   Hypotension   Hypovolemia   Atypical pneumonia   DIABETES MELLITUS   MYOCARDIAL INFARCTION   CARDIOMYOPATHY, ISCHEMIC, with BiV ICD, st Jude EF 23%   PAF (paroxysmal atrial fibrillation)   Long term (current) use of anticoagulants   Dyslipidemia- (LDL 160, previously in the 50s)   Chronic systolic heart failure    CKD III   Obesity   Transaminitis   1. Influenza-like illness and/or atypical pneumonia. Although the patient's influenza A panel is negative, will continue Tamiflu given his comorbid conditions and symptomatology. We'll continue doxycycline. I have already Xopenex for mild bronchospasms auscultated. I have also added Tessalon Perles and as needed Tussionex for cough.  Hypotension. This is likely secondary to hypovolemia and less likely to sepsis. His blood pressure is improving with IV fluids and holding diuretics and some of his antihypertensive medications. Toprol restarted at half the dose.  Acute renal insufficiency. Likely secondary to hypovolemia. Now resolved with gentle IV fluids.  Ischemic cardiomyopathy/CAD/biventricular AICD. Ejection fraction of 30% per 2-D echocardiogram 08/2013. Agree with cardiology in that he does not appear to have decompensated heart failure but rather an influenza-like illness or atypical pneumonia. His pro BNP is around baseline per cardiology. We'll continue gentle IV fluids cautiously, but will decrease the rate slightly. Continue chronic  medications with the exception of the ACE inhibitor and diuretic now on hold.  Chronic atrial fibrillation. Heart rate is currently controlled on amiodarone, digoxin and metoprolol. His INR is therapeutic.  Elevated liver transaminases. According to cardiology, this is chronic. The patient has no right upper quadrant abdominal pain. He is on a statin which may be playing a role. Continue to follow.  Type 2 diabetes mellitus. His hemoglobin A1c is 10.3 indicating suboptimal outpatient control. His CBGs have been reasonable so far today. Metformin is on hold.     Plan: 1. We'll decrease IV fluids from 75 cc to 50 cc. 2. Consider restarting ACE inhibitor as his blood pressure improves. 3. As above, Xopenex nebulizer, Tessalon Perles, and Tussionex have been added. 4. Restart metformin tomorrow. 5. Supplement with potassium chloride.   Time spent: 35 minutes.    [facility] Pager [phone]. If 7PM-7AM, please contact night-coverage at [email or web address], password [facility] 12/12/2013,  2:16 PM  LOS: 1 day

## 2013-12-13 DIAGNOSIS — E119 Type 2 diabetes mellitus without complications: Secondary | ICD-10-CM

## 2013-12-13 DIAGNOSIS — I959 Hypotension, unspecified: Secondary | ICD-10-CM | POA: Diagnosis not present

## 2013-12-13 DIAGNOSIS — I4891 Unspecified atrial fibrillation: Secondary | ICD-10-CM | POA: Diagnosis not present

## 2013-12-13 DIAGNOSIS — J189 Pneumonia, unspecified organism: Secondary | ICD-10-CM | POA: Diagnosis not present

## 2013-12-13 DIAGNOSIS — I5022 Chronic systolic (congestive) heart failure: Secondary | ICD-10-CM | POA: Diagnosis not present

## 2013-12-13 DIAGNOSIS — J111 Influenza due to unidentified influenza virus with other respiratory manifestations: Secondary | ICD-10-CM | POA: Diagnosis not present

## 2013-12-13 LAB — BASIC METABOLIC PANEL
BUN: 19 mg/dL (ref 6–23)
CO2: 20 mEq/L (ref 19–32)
Calcium: 8.8 mg/dL (ref 8.4–10.5)
Chloride: 101 mEq/L (ref 96–112)
Creatinine, Ser: 1.18 mg/dL (ref 0.50–1.35)
GFR calc Af Amer: 76 mL/min — ABNORMAL LOW (ref >90)
GFR, EST NON AFRICAN AMERICAN: 66 mL/min — AB (ref >90)
GLUCOSE: 198 mg/dL — AB (ref 70–99)
POTASSIUM: 3.9 meq/L (ref 3.7–5.3)
SODIUM: 135 meq/L — AB (ref 137–147)

## 2013-12-13 LAB — GLUCOSE, CAPILLARY
GLUCOSE-CAPILLARY: 197 mg/dL — AB (ref 70–99)
Glucose-Capillary: 209 mg/dL — ABNORMAL HIGH (ref 70–99)

## 2013-12-13 LAB — PROTIME-INR
INR: 3.07 — ABNORMAL HIGH (ref 0.00–1.49)
Prothrombin Time: 30.6 seconds — ABNORMAL HIGH (ref 11.6–15.2)

## 2013-12-13 MED ORDER — METOPROLOL SUCCINATE ER 100 MG PO TB24: 100.0000 mg | ORAL_TABLET | Freq: Every day | ORAL | Status: DC

## 2013-12-13 MED ORDER — WARFARIN SODIUM 2.5 MG PO TABS: ORAL_TABLET | ORAL | Status: DC

## 2013-12-13 MED ORDER — FUROSEMIDE 20 MG PO TABS: ORAL_TABLET | ORAL | Status: DC

## 2013-12-13 MED ORDER — DOXYCYCLINE HYCLATE 100 MG PO TABS: 100.0000 mg | ORAL_TABLET | Freq: Two times a day (BID) | ORAL | Status: AC

## 2013-12-13 MED ORDER — OSELTAMIVIR PHOSPHATE 75 MG PO CAPS: 75.0000 mg | ORAL_CAPSULE | Freq: Two times a day (BID) | ORAL | Status: DC

## 2013-12-13 MED ORDER — DOXYCYCLINE HYCLATE 100 MG PO TABS
100.0000 mg | ORAL_TABLET | Freq: Two times a day (BID) | ORAL | Status: DC
  Administered 2013-12-13: 100 mg via ORAL
  Filled 2013-12-13: qty 1

## 2013-12-13 NOTE — Progress Notes (Signed)
Consulting cardiologist: Remon Quinto  Subjective:    Feels better. Weakness is gone. He is is breathing better.Wants to go home.  Objective:   Temp:  [97.4 F (36.3 C)-98.7 F (37.1 C)] 98.7 F (37.1 C) (01/08 0505) Pulse Rate:  [74-107] 107 (01/08 0913) Resp:  [18-20] 20 (01/08 0505) BP: (90-110)/(64-70) 101/67 mmHg (01/08 0913) SpO2:  [92 %-98 %] 97 % (01/08 0758) Last BM Date: 12/10/13  Filed Weights   12/11/13 1019  Weight: 214 lb (97.07 kg)    Intake/Output Summary (Last 24 hours) at 12/13/13 0929 Last data filed at 12/13/13 0506  Gross per 24 hour  Intake    480 ml  Output   1700 ml  Net  -1220 ml    Telemetry: Paced with RBBB and atrial fib.  Exam:  General: No acute distress.  HEENT: Conjunctiva and lids normal, oropharynx clear.  Lungs: Clear to auscultation, nonlabored.  Cardiac: No elevated JVP or bruits. RRR, no gallop or rub.   Abdomen: Normoactive bowel sounds, nontender, nondistended.  Extremities: No pitting edema, distal pulses full.  Neuropsychiatric: Alert and oriented x3, affect appropriate.   Lab Results:  Basic Metabolic Panel:  Recent Labs Lab 12/11/13 1140 12/12/13 0555 12/13/13 0605  NA 131* 134* 135*  K 4.5 3.5* 3.9  CL 94* 99 101  CO2 24 24 20   GLUCOSE 275* 126* 198*  BUN 31* 22 19  CREATININE 1.43* 1.17 1.18  CALCIUM 9.4 8.7 8.8    Liver Function Tests:  Recent Labs Lab 12/11/13 1140  AST 55*  ALT 91*  ALKPHOS 58  BILITOT 1.5*  PROT 7.3  ALBUMIN 2.7*    CBC:  Recent Labs Lab 12/08/13 0747 12/11/13 1140 12/12/13 0555  WBC 14.2* 8.8 6.5  HGB 13.6 12.0* 11.8*  HCT 41.2 37.0* 36.5*  MCV 86.7 86.2 85.7  PLT 183 189 198    Cardiac Enzymes:  Recent Labs Lab 12/08/13 0747 12/11/13 1140  TROPONINI <0.30 <0.30    BNP:  Recent Labs  10/29/13 1629 12/08/13 0747 12/11/13 1140  PROBNP 3874.0* 7155.0* 3985.0*    Coagulation:  Recent Labs Lab 12/11/13 1140 12/12/13 0555 12/13/13 0605    INR 2.06* 2.45* 3.07*    Radiology: Dg Chest Portable 1 View  12/11/2013   CLINICAL DATA:  Hypotension today. Influenza. History of congestive heart failure, myocardial infarction and prostate cancer.  EXAM: PORTABLE CHEST - 1 VIEW  COMPARISON:  12/08/2013 and 11/24/2013.  FINDINGS: 1039 hr. The left subclavian pacemaker/ AICD leads appear grossly unchanged within the right atrium, right ventricle and coronary sinus. Multiple telemetry leads overlie the chest. Cardiomegaly appears stable. There is vascular congestion with probable mild residual perihilar airspace disease on the right. The overall aeration of the lungs has improved over the last 3 days. There is no pleural effusion or pneumothorax.  IMPRESSION: Improving airspace opacities with residual asymmetric component on the right, possibly due to asymmetric edema or atypical inflammation. No consolidation or significant pleural effusion.   Electronically Signed   By: Camie Patience M.D.   On: 12/11/2013 10:53      Medications:   Scheduled Medications: . amiodarone  200 mg Oral BID  . aspirin EC  81 mg Oral QHS  . atorvastatin  40 mg Oral q1800  . benzonatate  100 mg Oral TID  . citalopram  20 mg Oral Daily  . digoxin  0.125 mg Oral Q48H  . doxycycline (VIBRAMYCIN) IV  100 mg Intravenous Q12H  . fluticasone  1  spray Each Nare Daily  . insulin aspart  0-15 Units Subcutaneous TID WC  . insulin glargine  12 Units Subcutaneous QHS  . levalbuterol  0.63 mg Nebulization BID  . metFORMIN  500 mg Oral BID WC  . metoprolol succinate  100 mg Oral Daily  . niacin  1,000 mg Oral QHS  . omega-3 acid ethyl esters  1 g Oral BID  . oseltamivir  75 mg Oral BID  . pantoprazole  40 mg Oral Daily  . ranolazine  1,000 mg Oral BID  . sodium chloride  3 mL Intravenous Q12H  . Warfarin - Pharmacist Dosing Inpatient   Does not apply Q24H    Infusions:    PRN Medications: acetaminophen, acetaminophen, chlorpheniramine-HYDROcodone,  HYDROcodone-acetaminophen, morphine injection, nitroGLYCERIN, ondansetron (ZOFRAN) IV, ondansetron   Assessment and Plan:   1.Dyspnea: Significantly improved with antibiotics. Did not appear to be related to CHF decompensation. He will continue to stay off of enalapril until follow up. Would resume lasix on Sunday after discharge. Appt made with Dr. Harl Bowie on 12/25/2012.   2. Ischemic CM: EF of 30-35% with management using BB, Digoxin, spironolactone and lasix at home. Not on spironolactone or lasix here, due to renal insufficiency and possible dehydration. As above will wait to restart lasix/ACE on follow up. He is to weigh daily and avoid salt. Creatinine 1.18 this am. Potassium 3.9. Restarted BB at 100 mg daily and remains on digoxin. Ranexa should continue as well.   3. CAD: Most recent cardiac cath in 07/2013 with no ISR, segmental disease in his diagonal Kristel Durkee which was non-revascularizable. His RCA was nondominant. Medical therapy was recommended. He is also followed by a Dr. Scarlette Calico in [facility] who is also managing systolic dysfunction. Continue ASA and statin. Has agreed to be established in the [place] office.  4. Atrial fibrillation: HR is mildly elevated in the setting of dehydration and likely infecition. He remains on coumadin with pharmacy following. INR 2.5 this am.    Phill Myron. Purcell Nails NP [facility] 12/13/2013, 9:29 AM  Attending Note Patient seen and discussed with NP Purcell Nails. Symptoms of SOB and cough improving, appears secondary to URI and dehydration. From cardiac standpoint, in setting of low blood pressures his ACE-I and diuretic have been held in setting of low bp and dehydration, he has been maintained on 1/2 dose of his Toprol XL at 100mg  daily. Plan will be to continue Toprol XL at current dose and at discharge, will be further titrated as outpatient. Will also resume the ACE-I at follow up. Recommend resuming lasix as an outpatient on Sunday. Patient  will follow up with me Jan 20th, he also needs to be established with Dr Lovena Le and ICD clinic, we will arrange this at his outpatient follow up. Will sign off of inpatient care, please call with any questions.   Carlyle Dolly MD

## 2013-12-13 NOTE — Progress Notes (Signed)
[name] for Warfarin Indication: atrial fibrillation  No Known Allergies  Patient Measurements: Height: 5\' 10"  (177.8 cm) Weight: 214 lb (97.07 kg) IBW/kg (Calculated) : 73  Vital Signs: Temp: 98.7 F (37.1 C) (01/08 0505) Temp src: Oral (01/08 0505) BP: 101/67 mmHg (01/08 0913) Pulse Rate: 107 (01/08 0913)  Labs:  Recent Labs  12/11/13 1140 12/12/13 0555 12/13/13 0605  HGB 12.0* 11.8*  --   HCT 37.0* 36.5*  --   PLT 189 198  --   LABPROT 22.6* 25.8* 30.6*  INR 2.06* 2.45* 3.07*  CREATININE 1.43* 1.17 1.18  TROPONINI <0.30  --   --    Estimated Creatinine Clearance: 78.8 ml/min (by C-G formula based on Cr of 1.18).  Medical History: Past Medical History  Diagnosis Date  . Essential hypertension, benign   . Type 2 diabetes mellitus   . Coronary atherosclerosis of native coronary artery     [facility]MS to LAD 2001 at [facility], PTCA/atherectomy ramus and BMS to LAD 2009  . S/P colonoscopy     Normal via ostomy - September 2009  . Chronic systolic heart failure   . Myocardial infarction, anterior wall     Treated with tPA at [facility] 2000  . GERD (gastroesophageal reflux disease)   . Cardiomyopathy, ischemic     BIV ICD St. Jude, LVEF 23%  . Paroxysmal atrial fibrillation     Amiodarone and Coumadin  . 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 Prostate cancer   . 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 Transaminitis 12/12/2013   Medications:  Scheduled:  . amiodarone  200 mg Oral BID  . aspirin EC  81 mg Oral QHS  . atorvastatin  40 mg Oral q1800  . benzonatate  100 mg Oral TID  . citalopram  20 mg Oral Daily  . digoxin  0.125 mg Oral Q48H  . doxycycline (VIBRAMYCIN) IV  100 mg Intravenous Q12H  . fluticasone  1 spray Each Nare Daily  . insulin aspart  0-15 Units Subcutaneous TID WC  . insulin glargine  12 Units Subcutaneous QHS  . levalbuterol  0.63 mg Nebulization BID  . metFORMIN  500 mg Oral BID WC   . metoprolol succinate  100 mg Oral Daily  . niacin  1,000 mg Oral QHS  . omega-3 acid ethyl esters  1 g Oral BID  . oseltamivir  75 mg Oral BID  . pantoprazole  40 mg Oral Daily  . ranolazine  1,000 mg Oral BID  . sodium chloride  3 mL Intravenous Q12H  . Warfarin - Pharmacist Dosing Inpatient   Does not apply Q24H   Assessment: Admitted for flu symptoms and hypotension Continuation of warfarin for atrial fibrillation PTA Warfarin 2.5 mg po daily INR has trended up rapidly to supra-therapeutic range today.  Goal of Therapy:  INR 2-3   Plan:  HOLD coumadin today INR/PT daily  [name] A 12/13/2013,10:38 AM

## 2013-12-13 NOTE — Discharge Summary (Signed)
Patient seen, independently examined and chart reviewed. I agree with exam, assessment and plan discussed with Dyanne Carrel, NP.  58 year old man presented to the emergency department with shortness of breath, admitted for suspected acute viral syndrome, possible atypical pneumonia, further evaluation of hypotension thought to be secondary to overdiuresis, dehydration. He was seen in consultation with cardiology and there was no evidence to suggest acute CHF. He suspected his shortness of breath was from URI and possible atypical pneumonia.   Afebrile, vital signs stable. No hypoxia. Excellent urine output. He feels well and has no complaints.  Blood sugars stable. Basic metabolic panel unremarkable. Acute renal failure has resolved. INR elevated 3.07. Chest x-ray on admission showed improving airspace opacities, possible atypical inflammation.   He appears well and is stable for discharge today. Plan to complete a total of 7 days of doxycycline for suspected atypical pneumonia. INR has increased without warfarin. He will need close outpatient follow for PT/INR monitoring while on antibiotics, appointment made with [facility] at 1030 1/9.   As per cardiology stay off enalapril until follow-up. Resume Lasix 1/11 per cardiology. 1/2 dose Toprol on discharge (100 mg).   Consider discontinuing metformin with history of cardiomyopathy. Defer to primary care physician.  Murray Hodgkins, MD  [facility]  [phone]   Murray Hodgkins, MD [facility] [phone]

## 2013-12-13 NOTE — Discharge Summary (Signed)
Physician Discharge Summary  DOLPHUS CIRRITO [id number] DOB: Jan 04, 1956 DOA: 12/11/2013  PCP: Glo Herring., MD  Admit date: 12/11/2013 Discharge date: 12/13/2013  Time spent: 40 minutes minutes  Recommendations for Outpatient Follow-up:  1. Dr. Harl Bowie cardiology 12/25/13 2. INR check [facility] 12/14/13. 3. Dr. Gerarda Fraction PCP for evaluation of symptom and recommend CMET to track electrolytes and liver enzymes. Close follow up on elevated HgA1c for optimal glycemic control  Discharge Diagnoses:  Principal Problem:   Influenza-like illness Active Problems:   DIABETES MELLITUS   MYOCARDIAL INFARCTION   CARDIOMYOPATHY, ISCHEMIC, with BiV ICD, st Jude EF 23%   PAF (paroxysmal atrial fibrillation)   Long term (current) use of anticoagulants   Dyslipidemia- (LDL 160, previously in the 50s)   Chronic systolic heart failure   Hypotension    CKD III   Obesity   Hypovolemia   Atypical pneumonia   Transaminitis   Discharge Condition: stable  Diet recommendation: heart healthy  Filed Weights   12/11/13 1019  Weight: 97.07 kg (214 lb)    History of present illness:  [name] is a 58 y.o. male, with a past medical history significant for anal cancer status post colostomy, chemotherapy and radiation; ischemic cardiomyopathy with an EF of approximately 30% (08/08/2013) status post pacemaker placement; prostate cancer; paroxysmal atrial for ablation on chronic Coumadin; and diabetes mellitus. He reported that he began to feel bad on Friday, January 2. Primarily he was short of breath. He did not have an appetite. He took an additional dose of Lasix but it did not help his symptoms or cause him to urinate more than usual.. On 12/08/13 he went to the emergency department and was given IV Lasix. Again, he said the Lasix did not cause and to have increased urination. His symptoms did not improve. He denied fever, chest pain or changes in bowel habits. He endorsed shortness of breath and a cough  that was somewhat productive. His wife had similar symptoms. In the emergency department he was found to be hypotensive, mildly hyponatremic, and with mildly low chloride. He appeared dry. Chest x-ray showed " improving airspace opacities on the right".   Hospital Course:  1. Influenza-like illness and/or atypical pneumonia. Admitted to medical floor. Provided with antibiotics and tamiflu. In addition given nebs. He quickly improved. He remained afebrile and there was no hypoxia. Will be discharged with antibiotics to complete 7 day course. Recommend follow up with PCP 1-2 weeks   Hypotension. Likely secondary to hypovolemia. Given fluids and his blood pressure improved. Held diuretics and some of his ACE inhibitor. Toprol restarted at half the dose. Will stop ACE at discharge per cardiology and hold lasix until 12/16/13. Will follow up with cardiology 12/25/13.   Acute renal insufficiency. Likely secondary to hypovolemia. Resolved at discharge after gentle IV fluids.   Ischemic cardiomyopathy/CAD/biventricular AICD. Ejection fraction of 30% per 2-D echocardiogram 08/2013. Evaluated by cardiology who opined that he does not appear to have decompensated heart failure but rather an influenza-like illness or atypical pneumonia. His pro BNP  around baseline per cardiology. Continue chronic medications with the exception of the ACE inhibitor and diuretic now on hold as above.  Chronic atrial fibrillation. Heart rate remain controlled on amiodarone, digoxin and metoprolol. Metoprolol dose decreased. INR increased without taking coumadin likely relate to antibiotics. Will hold coumadin at discharge until INR check 12/14/13 as above.   Elevated liver transaminases. According to cardiology, this is chronic. The patient had no right upper quadrant abdominal pain. He  is on a statin which may be playing a role.    Type 2 diabetes mellitus. His hemoglobin A1c is 10.3 indicating suboptimal outpatient control. His CBGs  range 197-209. Resume metformin at discharge. Continue home lantus and novolog     Procedures:    Consultations:  cardiology  Discharge Exam: Filed Vitals:   12/13/13 0913  BP: 101/67  Pulse: 107  Temp:   Resp:     General: sitting on side of bed. Calm comfortable Cardiovascular: RRR No MGR No LE edema Respiratory: normal effort BS clear bilaterally no wheeze  Discharge Instructions  Discharge Orders   Future Appointments Provider Department Dept Phone   12/14/2013 10:30 AM Nalu Medal, RPH-CPP [facility] [phone]   12/25/2013 4:00 PM Arnoldo Lenis, MD [place] [phone]   01/25/2014 2:45 PM [facility] [phone]   01/25/2014 3:15 PM Ladell Pier, MD [place] [phone]   02/11/2014 10:45 AM Evans Lance, MD [facility] [place] [phone]   Future Orders Complete By Expires   Diet - low sodium heart healthy  As directed    Discharge instructions  As directed    Comments:     T   Increase activity slowly  As directed        Medication List    STOP taking these medications       enalapril 10 MG tablet  Commonly known as:  VASOTEC      TAKE these medications       amiodarone 200 MG tablet  Commonly known as:  PACERONE  Take 1 tablet (200 mg total) by mouth 2 (two) times daily.     aspirin 81 MG EC tablet  Take 81 mg by mouth at bedtime.     citalopram 20 MG tablet  Commonly known as:  CELEXA  Take 1 tablet (20 mg total) by mouth daily.     co-enzyme Q-10 50 MG capsule  Take 50 mg by mouth every morning.     CRESTOR 20 MG tablet  Generic drug:  rosuvastatin  Take 20 mg by mouth at bedtime.     digoxin 0.125 MG tablet  Commonly known as:  LANOXIN  Take 0.125 mg by mouth every other day.     doxycycline 100 MG tablet  Commonly known as:  VIBRA-TABS  Take 1 tablet (100 mg total) by mouth every 12 (twelve)  hours.     Fish Oil 1200 MG Caps  Take 1,200 mg by mouth 2 (two) times daily.     fluticasone 50 MCG/ACT nasal spray  Commonly known as:  FLONASE  Place 1 spray into both nostrils daily.     furosemide 20 MG tablet  Commonly known as:  LASIX  Hold until 12/16/13     insulin aspart 100 UNIT/ML injection  Commonly known as:  novoLOG  - Inject 12-14 Units into the skin 3 (three) times daily before meals. 14 units at breakfast and 12 units a lunch and 12 units at supper per sliding scale  -      insulin glargine 100 UNIT/ML injection  Commonly known as:  LANTUS  Inject 24 Units into the skin at bedtime.     isosorbide mononitrate 30 MG 24 hr tablet  Commonly known as:  IMDUR  Take 15 mg by mouth 2 (two) times daily.     metFORMIN 500 MG tablet  Commonly known as:  GLUCOPHAGE  Take 500  mg by mouth 2 (two) times daily with a meal.     metoprolol succinate 100 MG 24 hr tablet  Commonly known as:  TOPROL-XL  Take 1 tablet (100 mg total) by mouth daily. Take with or immediately following a meal.     niacin 1000 MG CR tablet  Commonly known as:  NIASPAN  Take 1,000 mg by mouth at bedtime.     nitroGLYCERIN 0.4 MG/SPRAY spray  Commonly known as:  NITROLINGUAL  Place 1 spray under the tongue every 5 (five) minutes as needed. angina     pantoprazole 40 MG tablet  Commonly known as:  PROTONIX  Take 1 tablet (40 mg total) by mouth daily.     ranolazine 1000 MG SR tablet  Commonly known as:  RANEXA  Take 1,000 mg by mouth 2 (two) times daily.     spironolactone 25 MG tablet  Commonly known as:  ALDACTONE  Take 25 mg by mouth daily.     warfarin 2.5 MG tablet  Commonly known as:  COUMADIN  Hold until next INR check scheduled for 12/14/13.       No Known Allergies     Follow-up Information   Follow up with Arnoldo Lenis, MD On 12/25/2013. (4pm)    Specialty:  Cardiology   Contact information:   [address] [place] [address] [phone]       Follow up  with [facility] On 12/14/2013. (10:30)    Contact information:   [address] [place] [address] [phone]      Follow up with Glo Herring., MD. Schedule an appointment as soon as possible for a visit in 1 week.   Specialty:  Internal Medicine   Contact information:   [address] [place] [address] [phone]        The results of significant diagnostics from this hospitalization (including imaging, microbiology, ancillary and laboratory) are listed below for reference.    Significant Diagnostic Studies: Dg Chest 2 View  12/08/2013   CLINICAL DATA:  Short of breath and chest pain  EXAM: CHEST  2 VIEW  COMPARISON:  DG THORACIC SPINE 4V dated 11/24/2013; DG CHEST 1V PORT dated 10/29/2013  FINDINGS: Left-sided pacemaker overlies normal cardiac silhouette. There is a new bilateral peripheral airspace disease. No pleural fluid is evident. There is some increased central venous congestion. No pneumothorax.  IMPRESSION: New of peripheral airspace disease suggesting pulmonary edema. Atypical infection felt less likely.   Electronically Signed   By: Suzy Bouchard M.D.   On: 12/08/2013 08:46   Dg Thoracic Spine 4v  11/24/2013   CLINICAL DATA:  Fall from tree.  Upper back pain.  EXAM: THORACIC SPINE - 4+ VIEW  COMPARISON:  Two-view CHEST x-ray 07/30/2013  FINDINGS: Degenerative changes and spurring throughout the thoracic spine. Normal alignment. No fracture.  IMPRESSION: Degenerative changes.  No acute findings.   Electronically Signed   By: Rolm Baptise M.D.   On: 11/24/2013 15:32   Dg Chest Portable 1 View  12/11/2013   CLINICAL DATA:  Hypotension today. Influenza. History of congestive heart failure, myocardial infarction and prostate cancer.  EXAM: PORTABLE CHEST - 1 VIEW  COMPARISON:  12/08/2013 and 11/24/2013.  FINDINGS: 1039 hr. The left subclavian pacemaker/ AICD leads appear grossly unchanged within the right atrium, right  ventricle and coronary sinus. Multiple telemetry leads overlie the chest. Cardiomegaly appears stable. There is vascular congestion with probable mild residual perihilar airspace disease on the right.  The overall aeration of the lungs has improved over the last 3 days. There is no pleural effusion or pneumothorax.  IMPRESSION: Improving airspace opacities with residual asymmetric component on the right, possibly due to asymmetric edema or atypical inflammation. No consolidation or significant pleural effusion.   Electronically Signed   By: Camie Patience M.D.   On: 12/11/2013 10:53    Microbiology: No results found for this or any previous visit (from the past 240 hour(s)).   Labs: Basic Metabolic Panel:  Recent Labs Lab 12/08/13 0747 12/11/13 1140 12/12/13 0555 12/13/13 0605  NA 132* 131* 134* 135*  K 4.9 4.5 3.5* 3.9  CL 94* 94* 99 101  CO2 17* 24 24 20   GLUCOSE 360* 275* 126* 198*  BUN 30* 31* 22 19  CREATININE 1.62* 1.43* 1.17 1.18  CALCIUM 9.4 9.4 8.7 8.8   Liver Function Tests:  Recent Labs Lab 12/11/13 1140  AST 55*  ALT 91*  ALKPHOS 58  BILITOT 1.5*  PROT 7.3  ALBUMIN 2.7*   No results found for this basename: LIPASE, AMYLASE,  in the last 168 hours No results found for this basename: AMMONIA,  in the last 168 hours CBC:  Recent Labs Lab 12/08/13 0747 12/11/13 1140 12/12/13 0555  WBC 14.2* 8.8 6.5  NEUTROABS 12.3* 7.2  --   HGB 13.6 12.0* 11.8*  HCT 41.2 37.0* 36.5*  MCV 86.7 86.2 85.7  PLT 183 189 198   Cardiac Enzymes:  Recent Labs Lab 12/08/13 0747 12/11/13 1140  TROPONINI <0.30 <0.30   BNP: BNP (last 3 results)  Recent Labs  10/29/13 1629 12/08/13 0747 12/11/13 1140  PROBNP 3874.0* 7155.0* 3985.0*   CBG:  Recent Labs Lab 12/12/13 1147 12/12/13 1642 12/12/13 2138 12/13/13 0717 12/13/13 1113  GLUCAP 186* 169* 232* 197* 209*       Signed:  BLACK,KAREN M  [facility] 12/13/2013, 1:11 PM

## 2013-12-13 NOTE — Progress Notes (Signed)
Patient seen, independently examined and chart reviewed. I agree with exam, assessment and plan discussed with Dyanne Carrel, NP.  58 year old man presented to the emergency department with shortness of breath, admitted for suspected acute viral syndrome, possible atypical pneumonia, further evaluation of hypotension thought to be secondary to overdiuresis, dehydration. He was seen in consultation with cardiology and there was no evidence to suggest acute CHF. He suspected his shortness of breath was from URI and possible atypical pneumonia.  Afebrile, vital signs stable. No hypoxia. Excellent urine output. He feels well and has no complaints.  Blood sugars stable. Basic metabolic panel unremarkable. Acute renal failure has resolved. INR elevated 3.07. Chest x-ray on admission showed improving airspace opacities, possible atypical inflammation.  He appears well and is stable for discharge today. Plan to complete a total of 7 days of doxycycline for suspected atypical pneumonia. INR has increased without warfarin. He will need close outpatient follow for PT/INR monitoring while on antibiotics, appointment made with [facility] at 1030 1/9.  As per cardiology stay off enalapril until follow-up. Resume Lasix 1/11 per cardiology. 1/2 dose Toprol on discharge (100 mg).   Murray Hodgkins, MD [facility] [phone]

## 2013-12-13 NOTE — Discharge Instructions (Signed)
Take medication as directed INR check 12/14/13. Hold coumadin until dose adjusted based on INR Hold lasix until 12/16/13

## 2013-12-14 ENCOUNTER — Ambulatory Visit: Payer: Medicare Other | Admitting: Pharmacist Clinician (PhC)/ Clinical Pharmacy Specialist

## 2013-12-17 ENCOUNTER — Other Ambulatory Visit: Payer: Self-pay | Admitting: *Deleted

## 2013-12-17 MED ORDER — WARFARIN SODIUM 2.5 MG PO TABS: ORAL_TABLET | ORAL | Status: DC

## 2013-12-17 MED ORDER — SPIRONOLACTONE 25 MG PO TABS: 25.0000 mg | ORAL_TABLET | Freq: Every day | ORAL | Status: DC

## 2013-12-19 NOTE — H&P (Signed)
  I have directly reviewed the clinical findings, lab, imaging studies and management of this patient in detail. I have interviewed and examined the patient and agree with the documentation,  as recorded by the Physician extender.  Thurnell Lose M.D on 12/19/2013 at 11:54 PM  [facility] Office  [phone]

## 2013-12-25 ENCOUNTER — Encounter: Payer: Self-pay | Admitting: Cardiology

## 2013-12-25 ENCOUNTER — Ambulatory Visit (INDEPENDENT_AMBULATORY_CARE_PROVIDER_SITE_OTHER): Payer: Medicare Other | Admitting: Cardiology

## 2013-12-25 VITALS — BP 111/67 | HR 70 | Ht 70.0 in | Wt 220.0 lb

## 2013-12-25 DIAGNOSIS — I2589 Other forms of chronic ischemic heart disease: Secondary | ICD-10-CM

## 2013-12-25 DIAGNOSIS — E782 Mixed hyperlipidemia: Secondary | ICD-10-CM

## 2013-12-25 DIAGNOSIS — I10 Essential (primary) hypertension: Secondary | ICD-10-CM

## 2013-12-25 DIAGNOSIS — I255 Ischemic cardiomyopathy: Secondary | ICD-10-CM

## 2013-12-25 DIAGNOSIS — I4891 Unspecified atrial fibrillation: Secondary | ICD-10-CM | POA: Diagnosis not present

## 2013-12-25 MED ORDER — ENALAPRIL MALEATE 5 MG PO TABS
5.0000 mg | ORAL_TABLET | Freq: Every day | ORAL | Status: DC
Start: 2013-12-25 — End: 2014-01-26

## 2013-12-25 NOTE — Patient Instructions (Signed)
Your physician recommends that you schedule a follow-up appointment in: [place]  Your physician has recommended you make the following change in your medication:   1) START TAKING ENALAPRIL 5MG  ONCE DAILY  Your physician recommends that you return for lab work in: [place] ([place] BMET AND LIPID)

## 2013-12-25 NOTE — Progress Notes (Signed)
Clinical Summary Mr. Broxson is a 58 y.o.male former patient of Dr Gwenlyn Found, this is our first visit together. He was seen for the following medical problems.  1. CAD/ICM - prior BMS to LAD in 2001, repeat BMS to LAD to 2009. 08/2013 LVEF 30-35%. - he has BiV AICD placed by Dr Caryl Comes. He has follow up with Dr Lovena Le 02/2014. - last cath showed tight non-dominant RCA lesion 07/2013, this was elected for medical management.  - no chest pain, no SOB, no DOE. Fairly sedentary lifestyle, but does not have any DOE with activities - limiting salt intake, avoiding NSAIDs - compliant with meds, taking 100mg  daily Toprol XL, reports fatigue with higher doses. Was on enalapril 10mg  bid, was held during recent admission in setting AKI and not restarted at discharge.  2. HTN - checks bp at home 1-2 times a week. Typically 90s/70s. Denies any lightheadedness or dizziness  3. Afib - denies any palpitations - compliant with coumadin, denies any bleeding problems,   4. Hyperlipidemia - compliant with statin crestor 20mg  daily - panel 07/2013 showed TC 182 TG 122 HDL 37 LDL 121   Past Medical History  Diagnosis Date  . Essential hypertension, benign   . Type 2 diabetes mellitus   . Coronary atherosclerosis of native coronary artery     BMS to LAD 2001 at [facility], PTCA/atherectomy ramus and BMS to LAD 2009  . S/P colonoscopy     Normal via ostomy - September 2009  . Chronic systolic heart failure   . Myocardial infarction, anterior wall     Treated with tPA at [facility] 2000  . GERD (gastroesophageal reflux disease)   . Cardiomyopathy, ischemic     BIV ICD St. Jude, LVEF 23%  . Paroxysmal atrial fibrillation     Amiodarone and Coumadin  . 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 Prostate cancer   . 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 Transaminitis 12/12/2013     No Known Allergies   Current Outpatient Prescriptions  Medication Sig Dispense Refill  . amiodarone  (PACERONE) 200 MG tablet Take 1 tablet (200 mg total) by mouth 2 (two) times daily.  60 tablet  1  . aspirin 81 MG EC tablet Take 81 mg by mouth at bedtime.       . citalopram (CELEXA) 20 MG tablet Take 1 tablet (20 mg total) by mouth daily.  30 tablet  2  . co-enzyme Q-10 50 MG capsule Take 50 mg by mouth every morning.       Marland Kitchen CRESTOR 20 MG tablet Take 20 mg by mouth at bedtime.       . digoxin (LANOXIN) 0.125 MG tablet Take 0.125 mg by mouth every other day.       . fluticasone (FLONASE) 50 MCG/ACT nasal spray Place 1 spray into both nostrils daily.      . furosemide (LASIX) 20 MG tablet Hold until 12/16/13  60 tablet  0  . insulin aspart (NOVOLOG) 100 UNIT/ML injection Inject 12-14 Units into the skin 3 (three) times daily before meals. 14 units at breakfast and 12 units a lunch and 12 units at supper per sliding scale       . insulin glargine (LANTUS) 100 UNIT/ML injection Inject 24 Units into the skin at bedtime.        . isosorbide mononitrate (IMDUR) 30 MG 24 hr tablet Take 15 mg by mouth 2 (two) times daily.      Marland Kitchen  metFORMIN (GLUCOPHAGE) 500 MG tablet Take 500 mg by mouth 2 (two) times daily with a meal.      . metoprolol succinate (TOPROL-XL) 100 MG 24 hr tablet Take 1 tablet (100 mg total) by mouth daily. Take with or immediately following a meal.  30 tablet  0  . niacin (NIASPAN) 1000 MG CR tablet Take 1,000 mg by mouth at bedtime.        . nitroGLYCERIN (NITROLINGUAL) 0.4 MG/SPRAY spray Place 1 spray under the tongue every 5 (five) minutes as needed. angina      . Omega-3 Fatty Acids (FISH OIL) 1200 MG CAPS Take 1,200 mg by mouth 2 (two) times daily.        . pantoprazole (PROTONIX) 40 MG tablet Take 1 tablet (40 mg total) by mouth daily.  30 tablet  6  . ranolazine (RANEXA) 1000 MG SR tablet Take 1,000 mg by mouth 2 (two) times daily.      Marland Kitchen spironolactone (ALDACTONE) 25 MG tablet Take 1 tablet (25 mg total) by mouth daily.  45 tablet  6  . warfarin (COUMADIN) 2.5 MG tablet Hold  until next INR check scheduled for 12/14/13.  30 tablet  6   No current facility-administered medications for this visit.     Past Surgical History  Procedure Laterality Date  . Internal defibrillator and pacemaker  2010    x2 St. Jude device  . Abdominal and perineal resection of rectum with total mesorectal excision      10/04/2007  . Colonoscopy      05/17/2007. IMPRESSION: Semilunar, apple-core neoplasm low in the rectum (palpable on digital rectal exam) beginning at 5 cm and corkscrewing up 5 cm in length. This was a low rectal lesion consistent with colorectal carcinoma. It was biosied multiple times. The upstream colon all the way to the cecum appeared normal. Recommendations: Followup on path. Surgical Consultation   . Colonoscopy  09/14/2011    Procedure: COLONOSCOPY;  Surgeon: Daneil Dolin, MD;  Location: [facility];  Service: Endoscopy;  Laterality: N/A;  8:30- TCS via colostomy & pt has defibrillator  . Colostomy       No Known Allergies    Family History  Problem Relation Age of Onset  . Colon cancer Mother 20  . Colon cancer Sister 82  . Coronary artery disease Father      Social History Mr. Werntz reports that he has never smoked. He has never used smokeless tobacco. Mr. Samek reports that he does not drink alcohol.   Review of Systems CONSTITUTIONAL: No weight loss, fever, chills, weakness or fatigue.  HEENT: Eyes: No visual loss, blurred vision, double vision or yellow sclerae.No hearing loss, sneezing, congestion, runny nose or sore throat.  SKIN: No rash or itching.  CARDIOVASCULAR: per HPI RESPIRATORY: No shortness of breath, cough or sputum.  GASTROINTESTINAL: No anorexia, nausea, vomiting or diarrhea. No abdominal pain or blood.  GENITOURINARY: No burning on urination, no polyuria NEUROLOGICAL: No headache, dizziness, syncope, paralysis, ataxia, numbness or tingling in the extremities. No change in bowel or bladder control.  MUSCULOSKELETAL: No  muscle, back pain, joint pain or stiffness.  LYMPHATICS: No enlarged nodes. No history of splenectomy.  PSYCHIATRIC: No history of depression or anxiety.  ENDOCRINOLOGIC: No reports of sweating, cold or heat intolerance. No polyuria or polydipsia.  Marland Kitchen   Physical Examination Filed Vitals:   12/25/13 1542  BP: 111/67  Pulse: 70   Filed Weights   12/25/13 1542  Weight: 220 lb (99.791  kg)    Gen: resting comfortably, no acute distress HEENT: no scleral icterus, pupils equal round and reactive, no palptable cervical adenopathy,  CV: RRR, no m/r/g, no JVD, no carotid bruits Resp: Clear to auscultation bilaterally GI: abdomen is soft, non-tender, non-distended, normal bowel sounds, no hepatosplenomegaly MSK: extremities are warm, no edema.  Skin: warm, no rash Neuro:  no focal deficits Psych: appropriate affect   Diagnostic Studies 08/2013 Echo LVEF 30-35%, + WMAs, grade I diastolic dysfunction, mild MR,   07/2013 Cath RESULTS: Left main coronary artery. The left main coronary artery is  free of significant disease.  Left anterior descending artery. The left anterior descending artery  gave rise to three septal perforators and two diagonal branches. These  and __________ were free of significant disease.  The circumflex artery: The circumflex artery gave rise to a ramus  Corneilus Heggie, a marginal Jakera Beaupre, and a small posterolateral Tacey Dimaggio. These  vessels were free of significant disease.  The right coronary artery: The right coronary artery was a moderate-  sized vessel that gave rise to posterior descending Chevez Sambrano and three  posterolateral branches. The vessel also gave rise to a right  ventricular Raeford Brandenburg. There was an 80-90% stenosis at the ostium of the  right coronary artery. We could see some collateral filling from the  left coronary artery, which appeared to be right ventricular branches.  The left ventriculogram: The left ventriculogram performed in the RAO  projection showed  good wall motion with no areas of hypokinesis. The  estimated ejection fraction was 60%.  The left ventricular pressure was 154/28 and __________ pressure was  154/86 with a mean of 116.  CONCLUSION: Coronary artery disease with 80-90% stenosis in the ostium  of the right coronary artery, no significant obstruction in the LAD and  circumflex arteries and normal LV function.  RECOMMENDATIONS: The patient has a tight what appears to be flow-  limiting lesion in the ostium of the right coronary artery. We will  plan to schedule the patient for to return for percutaneous coronary  intervention on Tuesday April 13. In the meantime, we will start Plavix  and continue aspirin and start the Toprol-XL 25 mg.   Assessment and Plan  1.CAD/ICM - LVEF 30-35% by echo 08/2013, NYHA III, he has a BiV AICD - appears euvolemic today - continue beta blocker at current dose, reports fatigue on higher doses. Will restart low dose enalapril and titrate as tolerated, check BMET in 3-4 weeks.  2. HTN - at goal, continue current meds  3. Hyperlipidemia - last lipid panel LDL was not at goal (LDL goal< 70), will repeat panel. May need further titration of crestor  4. Afib - no current symptoms, continue current medications and coumadin for anticoagulation   Follow up 6 weeks   Arnoldo Lenis, M.D., F.A.C.C.

## 2014-01-10 ENCOUNTER — Telehealth: Payer: Self-pay | Admitting: Cardiology

## 2014-01-10 ENCOUNTER — Telehealth: Payer: Self-pay

## 2014-01-10 DIAGNOSIS — E782 Mixed hyperlipidemia: Secondary | ICD-10-CM | POA: Diagnosis not present

## 2014-01-10 DIAGNOSIS — I10 Essential (primary) hypertension: Secondary | ICD-10-CM | POA: Diagnosis not present

## 2014-01-10 MED ORDER — WARFARIN SODIUM 2.5 MG PO TABS: ORAL_TABLET | ORAL | Status: DC

## 2014-01-10 NOTE — Telephone Encounter (Signed)
Received fax refill request  Rx # [id number] Medication:  Coumadin 2.5 mg tablet Qty 30 Sig:  Take one tablet by mouth once daily Physician:  Harl Bowie

## 2014-01-11 LAB — LIPID PANEL
Cholesterol: 268 mg/dL — ABNORMAL HIGH (ref 0–200)
HDL: 41 mg/dL (ref >39)
LDL CALC: 194 mg/dL — AB (ref 0–99)
Total CHOL/HDL Ratio: 6.5 Ratio
Triglycerides: 166 mg/dL — ABNORMAL HIGH (ref <150)
VLDL: 33 mg/dL (ref 0–40)

## 2014-01-11 LAB — BASIC METABOLIC PANEL
BUN: 30 mg/dL — AB (ref 6–23)
CHLORIDE: 95 meq/L — AB (ref 96–112)
CO2: 24 meq/L (ref 19–32)
CREATININE: 1.63 mg/dL — AB (ref 0.50–1.35)
Calcium: 9.9 mg/dL (ref 8.4–10.5)
GLUCOSE: 316 mg/dL — AB (ref 70–99)
Potassium: 5.4 mEq/L — ABNORMAL HIGH (ref 3.5–5.3)
Sodium: 132 mEq/L — ABNORMAL LOW (ref 135–145)

## 2014-01-18 ENCOUNTER — Encounter: Payer: Self-pay | Admitting: *Deleted

## 2014-01-18 MED ORDER — ROSUVASTATIN CALCIUM 40 MG PO TABS: 40.0000 mg | ORAL_TABLET | Freq: Every day | ORAL | Status: DC

## 2014-01-18 NOTE — Addendum Note (Signed)
Addended by: Shara Blazing A on: 01/18/2014 10:19 AM   Modules accepted: Orders

## 2014-01-25 ENCOUNTER — Other Ambulatory Visit: Payer: Medicare Other

## 2014-01-25 ENCOUNTER — Ambulatory Visit: Payer: Medicare Other | Admitting: Oncology

## 2014-01-26 ENCOUNTER — Emergency Department (HOSPITAL_COMMUNITY): Payer: Medicare Other

## 2014-01-26 ENCOUNTER — Observation Stay (HOSPITAL_COMMUNITY)
Admission: EM | Admit: 2014-01-26 | Discharge: 2014-01-29 | Disposition: A | Discharge status: Home / Self Care | Payer: Medicare Other | Attending: Internal Medicine | Admitting: Internal Medicine

## 2014-01-26 ENCOUNTER — Encounter (HOSPITAL_COMMUNITY): Payer: Self-pay | Admitting: Emergency Medicine

## 2014-01-26 DIAGNOSIS — I480 Paroxysmal atrial fibrillation: Secondary | ICD-10-CM

## 2014-01-26 DIAGNOSIS — N183 Chronic kidney disease, stage 3 unspecified: Secondary | ICD-10-CM | POA: Diagnosis not present

## 2014-01-26 DIAGNOSIS — R42 Dizziness and giddiness: Secondary | ICD-10-CM | POA: Diagnosis not present

## 2014-01-26 DIAGNOSIS — N179 Acute kidney failure, unspecified: Secondary | ICD-10-CM | POA: Diagnosis not present

## 2014-01-26 DIAGNOSIS — I509 Heart failure, unspecified: Secondary | ICD-10-CM | POA: Insufficient documentation

## 2014-01-26 DIAGNOSIS — Z7982 Long term (current) use of aspirin: Secondary | ICD-10-CM | POA: Diagnosis not present

## 2014-01-26 DIAGNOSIS — Z79899 Other long term (current) drug therapy: Secondary | ICD-10-CM | POA: Insufficient documentation

## 2014-01-26 DIAGNOSIS — D649 Anemia, unspecified: Secondary | ICD-10-CM | POA: Insufficient documentation

## 2014-01-26 DIAGNOSIS — I959 Hypotension, unspecified: Secondary | ICD-10-CM

## 2014-01-26 DIAGNOSIS — R269 Unspecified abnormalities of gait and mobility: Secondary | ICD-10-CM | POA: Diagnosis not present

## 2014-01-26 DIAGNOSIS — Z7901 Long term (current) use of anticoagulants: Secondary | ICD-10-CM

## 2014-01-26 DIAGNOSIS — E1165 Type 2 diabetes mellitus with hyperglycemia: Secondary | ICD-10-CM | POA: Insufficient documentation

## 2014-01-26 DIAGNOSIS — Z6829 Body mass index (BMI) 29.0-29.9, adult: Secondary | ICD-10-CM | POA: Diagnosis not present

## 2014-01-26 DIAGNOSIS — N189 Chronic kidney disease, unspecified: Secondary | ICD-10-CM

## 2014-01-26 DIAGNOSIS — IMO0002 Reserved for concepts with insufficient information to code with codable children: Secondary | ICD-10-CM | POA: Diagnosis present

## 2014-01-26 DIAGNOSIS — R5383 Other fatigue: Secondary | ICD-10-CM | POA: Diagnosis not present

## 2014-01-26 DIAGNOSIS — Z95810 Presence of automatic (implantable) cardiac defibrillator: Secondary | ICD-10-CM | POA: Diagnosis not present

## 2014-01-26 DIAGNOSIS — Z794 Long term (current) use of insulin: Secondary | ICD-10-CM | POA: Insufficient documentation

## 2014-01-26 DIAGNOSIS — R5381 Other malaise: Secondary | ICD-10-CM | POA: Diagnosis not present

## 2014-01-26 DIAGNOSIS — I252 Old myocardial infarction: Secondary | ICD-10-CM | POA: Diagnosis not present

## 2014-01-26 DIAGNOSIS — I2510 Atherosclerotic heart disease of native coronary artery without angina pectoris: Secondary | ICD-10-CM | POA: Insufficient documentation

## 2014-01-26 DIAGNOSIS — G909 Disorder of the autonomic nervous system, unspecified: Secondary | ICD-10-CM | POA: Diagnosis not present

## 2014-01-26 DIAGNOSIS — I10 Essential (primary) hypertension: Secondary | ICD-10-CM | POA: Diagnosis not present

## 2014-01-26 DIAGNOSIS — I5022 Chronic systolic (congestive) heart failure: Secondary | ICD-10-CM | POA: Insufficient documentation

## 2014-01-26 DIAGNOSIS — I129 Hypertensive chronic kidney disease with stage 1 through stage 4 chronic kidney disease, or unspecified chronic kidney disease: Secondary | ICD-10-CM | POA: Insufficient documentation

## 2014-01-26 DIAGNOSIS — I4891 Unspecified atrial fibrillation: Secondary | ICD-10-CM | POA: Diagnosis not present

## 2014-01-26 DIAGNOSIS — I2589 Other forms of chronic ischemic heart disease: Secondary | ICD-10-CM | POA: Diagnosis not present

## 2014-01-26 DIAGNOSIS — E1129 Type 2 diabetes mellitus with other diabetic kidney complication: Secondary | ICD-10-CM | POA: Insufficient documentation

## 2014-01-26 DIAGNOSIS — K219 Gastro-esophageal reflux disease without esophagitis: Secondary | ICD-10-CM | POA: Diagnosis not present

## 2014-01-26 DIAGNOSIS — Z8546 Personal history of malignant neoplasm of prostate: Secondary | ICD-10-CM | POA: Insufficient documentation

## 2014-01-26 DIAGNOSIS — I255 Ischemic cardiomyopathy: Secondary | ICD-10-CM | POA: Diagnosis present

## 2014-01-26 DIAGNOSIS — E669 Obesity, unspecified: Secondary | ICD-10-CM | POA: Insufficient documentation

## 2014-01-26 DIAGNOSIS — Z85048 Personal history of other malignant neoplasm of rectum, rectosigmoid junction, and anus: Secondary | ICD-10-CM | POA: Insufficient documentation

## 2014-01-26 DIAGNOSIS — I219 Acute myocardial infarction, unspecified: Secondary | ICD-10-CM

## 2014-01-26 DIAGNOSIS — E1149 Type 2 diabetes mellitus with other diabetic neurological complication: Secondary | ICD-10-CM | POA: Insufficient documentation

## 2014-01-26 LAB — CBC WITH DIFFERENTIAL/PLATELET
Basophils Absolute: 0 10*3/uL (ref 0.0–0.1)
Basophils Relative: 0 % (ref 0–1)
Eosinophils Absolute: 0.2 10*3/uL (ref 0.0–0.7)
Eosinophils Relative: 3 % (ref 0–5)
HEMATOCRIT: 38.1 % — AB (ref 39.0–52.0)
HEMOGLOBIN: 12.3 g/dL — AB (ref 13.0–17.0)
Lymphocytes Relative: 12 % (ref 12–46)
Lymphs Abs: 0.9 10*3/uL (ref 0.7–4.0)
MCH: 26.9 pg (ref 26.0–34.0)
MCHC: 32.3 g/dL (ref 30.0–36.0)
MCV: 83.4 fL (ref 78.0–100.0)
MONO ABS: 0.6 10*3/uL (ref 0.1–1.0)
MONOS PCT: 8 % (ref 3–12)
NEUTROS ABS: 6.2 10*3/uL (ref 1.7–7.7)
Neutrophils Relative %: 78 % — ABNORMAL HIGH (ref 43–77)
Platelets: 167 10*3/uL (ref 150–400)
RBC: 4.57 MIL/uL (ref 4.22–5.81)
RDW: 15.3 % (ref 11.5–15.5)
WBC: 7.9 10*3/uL (ref 4.0–10.5)

## 2014-01-26 LAB — PRO B NATRIURETIC PEPTIDE: Pro B Natriuretic peptide (BNP): 2348 pg/mL — ABNORMAL HIGH (ref 0–125)

## 2014-01-26 LAB — COMPREHENSIVE METABOLIC PANEL
ALK PHOS: 101 U/L (ref 39–117)
ALT: 39 U/L (ref 0–53)
AST: 41 U/L — AB (ref 0–37)
Albumin: 3.4 g/dL — ABNORMAL LOW (ref 3.5–5.2)
BILIRUBIN TOTAL: 0.8 mg/dL (ref 0.3–1.2)
BUN: 30 mg/dL — ABNORMAL HIGH (ref 6–23)
CHLORIDE: 93 meq/L — AB (ref 96–112)
CO2: 22 meq/L (ref 19–32)
CREATININE: 1.66 mg/dL — AB (ref 0.50–1.35)
Calcium: 9.2 mg/dL (ref 8.4–10.5)
GFR calc Af Amer: 51 mL/min — ABNORMAL LOW (ref >90)
GFR calc non Af Amer: 44 mL/min — ABNORMAL LOW (ref >90)
Glucose, Bld: 367 mg/dL — ABNORMAL HIGH (ref 70–99)
Potassium: 4.1 mEq/L (ref 3.7–5.3)
Sodium: 130 mEq/L — ABNORMAL LOW (ref 137–147)
Total Protein: 8 g/dL (ref 6.0–8.3)

## 2014-01-26 LAB — PROTIME-INR
INR: 2.39 — AB (ref 0.00–1.49)
Prothrombin Time: 25.3 seconds — ABNORMAL HIGH (ref 11.6–15.2)

## 2014-01-26 LAB — APTT: APTT: 36 s (ref 24–37)

## 2014-01-26 LAB — CBG MONITORING, ED: Glucose-Capillary: 337 mg/dL — ABNORMAL HIGH (ref 70–99)

## 2014-01-26 LAB — TROPONIN I

## 2014-01-26 LAB — ETHANOL

## 2014-01-26 MED ORDER — SODIUM CHLORIDE 0.9 % IV BOLUS (SEPSIS)
500.0000 mL | Freq: Once | INTRAVENOUS | Status: AC
  Administered 2014-01-26: 23:00:00 via INTRAVENOUS

## 2014-01-26 MED ORDER — ONDANSETRON HCL 4 MG/2ML IJ SOLN: 4.0000 mg | Freq: Three times a day (TID) | INTRAMUSCULAR | Status: DC | PRN

## 2014-01-26 MED ORDER — SODIUM CHLORIDE 0.9 % IV SOLN: INTRAVENOUS | Status: DC

## 2014-01-26 MED ORDER — ONDANSETRON HCL 4 MG/2ML IJ SOLN
4.0000 mg | Freq: Once | INTRAMUSCULAR | Status: AC
  Administered 2014-01-26: 4 mg via INTRAVENOUS
  Filled 2014-01-26: qty 2

## 2014-01-26 MED ORDER — ASPIRIN 81 MG PO CHEW
324.0000 mg | CHEWABLE_TABLET | Freq: Once | ORAL | Status: AC
  Administered 2014-01-26: 324 mg via ORAL
  Filled 2014-01-26: qty 4

## 2014-01-26 NOTE — H&P (Signed)
PCP:   Glo Herring., MD   Chief Complaint:  dizziness  HPI: 58 yo male with sudden onset of dizziness today room spinning much worse when gets up to move.  Better with lying down.  No fevers.  Associated n/v nonbloody.  No diarrhea.  No recent illnessess.  Has happened to him before but never this severe.  No weakness in arms or legs.  No slrred spech.  Feeling better.  Tele neuro called, recommended doing cta if cr is better in morning as pt cannot have mri due to aicd.  Review of Systems:  Positive and negative as per HPI otherwise all other systems are negative  Past Medical History: Past Medical History  Diagnosis Date  . Essential hypertension, benign   . Type 2 diabetes mellitus   . Coronary atherosclerosis of native coronary artery     BMS to LAD 2001 at [facility], PTCA/atherectomy ramus and BMS to LAD 2009  . S/P colonoscopy     Normal via ostomy - September 2009  . Chronic systolic heart failure   . Myocardial infarction, anterior wall     Treated with tPA at [facility] 2000  . GERD (gastroesophageal reflux disease)   . Cardiomyopathy, ischemic     BIV ICD St. Jude, LVEF 23%  . Paroxysmal atrial fibrillation     Amiodarone and Coumadin  . 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 Prostate cancer   . 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 Transaminitis 12/12/2013  . CHF (congestive heart failure)    Past Surgical History  Procedure Laterality Date  . Internal defibrillator and pacemaker  2010    x2 St. Jude device  . Abdominal and perineal resection of rectum with total mesorectal excision      10/04/2007  . Colonoscopy      05/17/2007. IMPRESSION: Semilunar, apple-core neoplasm low in the rectum (palpable on digital rectal exam) beginning at 5 cm and corkscrewing up 5 cm in length. This was a low rectal lesion consistent with colorectal carcinoma. It was biosied multiple times. The upstream colon all the way to the cecum appeared normal.  Recommendations: Followup on path. Surgical Consultation   . Colonoscopy  09/14/2011    Procedure: COLONOSCOPY;  Surgeon: Daneil Dolin, MD;  [facility]: [facility];  Service: Endoscopy;  Laterality: N/A;  8:30- TCS via colostomy & pt has defibrillator  . Colostomy      Medications: Prior to Admission medications   Medication Sig Start Date End Date Taking? Authorizing Provider  amiodarone (PACERONE) 200 MG tablet Take 1 tablet (200 mg total) by mouth 2 (two) times daily. 10/25/13  Yes Kelvin Cellar, MD  aspirin 81 MG EC tablet Take 81 mg by mouth at bedtime.    Yes Historical Provider, MD  citalopram (CELEXA) 20 MG tablet Take 10 mg by mouth daily. 11/22/13  Yes Lorretta Harp, MD  co-enzyme Q-10 50 MG capsule Take 50 mg by mouth every morning.    Yes Historical Provider, MD  digoxin (LANOXIN) 0.125 MG tablet Take 0.125 mg by mouth every other day.    Yes Historical Provider, MD  fluticasone (FLONASE) 50 MCG/ACT nasal spray Place 1 spray into both nostrils daily as needed for allergies.  11/21/13  Yes Historical Provider, MD  furosemide (LASIX) 20 MG tablet Take 20 mg by mouth daily.  12/13/13  Yes Lezlie Octave Black, NP  insulin aspart (NOVOLOG) 100 UNIT/ML injection Inject 12-14 Units into the skin 3 (three)  times daily as needed for high blood sugar. 14 units at breakfast and 12 units a lunch and 12 units at supper per sliding scale    Yes Historical Provider, MD  insulin glargine (LANTUS) 100 UNIT/ML injection Inject 24 Units into the skin at bedtime.     Yes Historical Provider, MD  isosorbide mononitrate (IMDUR) 30 MG 24 hr tablet Take 15 mg by mouth 2 (two) times daily.   Yes Historical Provider, MD  metFORMIN (GLUCOPHAGE) 500 MG tablet Take 500 mg by mouth 2 (two) times daily with a meal.   Yes Historical Provider, MD  metoprolol succinate (TOPROL-XL) 100 MG 24 hr tablet Take 1 tablet (100 mg total) by mouth daily. Take with or immediately following a meal. 12/13/13  Yes Lesle Chris Black, NP   niacin (NIASPAN) 1000 MG CR tablet Take 1,000 mg by mouth at bedtime.     Yes Historical Provider, MD  Omega-3 Fatty Acids (FISH OIL) 1200 MG CAPS Take 1,200 mg by mouth 2 (two) times daily.     Yes Historical Provider, MD  pantoprazole (PROTONIX) 40 MG tablet Take 1 tablet (40 mg total) by mouth daily. 11/20/13  Yes [name], MD  ranolazine (RANEXA) 1000 MG SR tablet Take 1,000 mg by mouth 2 (two) times daily.   Yes Historical Provider, MD  rosuvastatin (CRESTOR) 40 MG tablet Take 40 mg by mouth at bedtime. 01/18/14  Yes Antoine Poche, MD  spironolactone (ALDACTONE) 25 MG tablet Take 1 tablet (25 mg total) by mouth daily. 12/17/13  Yes Mihai Croitoru, MD  warfarin (COUMADIN) 2.5 MG tablet Take 2.5 mg by mouth daily at 6 PM. 01/10/14  Yes Antoine Poche, MD  nitroGLYCERIN (NITROLINGUAL) 0.4 MG/SPRAY spray Place 1 spray under the tongue every 5 (five) minutes as needed. angina    Historical Provider, MD    Allergies:  No Known Allergies  Social History:  reports that he has never smoked. He has never used smokeless tobacco. He reports that he does not drink alcohol or use illicit drugs.  Family History: Family History  Problem Relation Age of Onset  . Colon cancer Mother 31  . Colon cancer Sister 54  . Coronary artery disease Father     Physical Exam: Filed Vitals:   01/26/14 2238 01/26/14 2240 01/26/14 2241 01/26/14 2241  BP: 106/68 106/68 96/59 99/58   Pulse:  71 71 70  Temp:      TempSrc:      Resp: 18     Height:      Weight:      SpO2:       General appearance: alert, cooperative and no distress Head: Normocephalic, without obvious abnormality, atraumatic Eyes: negative Nose: Nares normal. Septum midline. Mucosa normal. No drainage or sinus tenderness. Neck: no JVD and supple, symmetrical, trachea midline Lungs: clear to auscultation bilaterally Heart: regular rate and rhythm, S1, S2 normal, no murmur, click, rub or gallop Abdomen: soft, non-tender; bowel  sounds normal; no masses,  no organomegaly Extremities: extremities normal, atraumatic, no cyanosis or edema Pulses: 2+ and symmetric Skin: Skin color, texture, turgor normal. No rashes or lesions Neurologic: Grossly normal    Labs on Admission:   Recent Labs  01/26/14 2127  NA 130*  K 4.1  CL 93*  CO2 22  GLUCOSE 367*  BUN 30*  CREATININE 1.66*  CALCIUM 9.2    Recent Labs  01/26/14 2127  AST 41*  ALT 39  ALKPHOS 101  BILITOT 0.8  PROT 8.0  ALBUMIN 3.4*    Recent Labs  01/26/14 2127  WBC 7.9  NEUTROABS 6.2  HGB 12.3*  HCT 38.1*  MCV 83.4  PLT 167    Recent Labs  01/26/14 2127  TROPONINI <0.30   Radiological Exams on Admission: Dg Chest 1 View  01/26/2014   CLINICAL DATA:  Dizziness, weakness and unsteady gait. Systolic heart failure.  EXAM: CHEST - 1 VIEW  COMPARISON:  DG CHEST 1V PORT dated 12/11/2013  FINDINGS: Cardiac silhouette remains moderately enlarged, mediastinal silhouette is nonsuspicious. Biventricular left cardiac defibrillator in situ. Lungs are clear, no pleural effusions or focal consolidations, improved aeration from prior examination. No pneumothorax.  Multiple EKG lines overlie the patient and may obscure subtle underlying pathology. Mild degenerative change of the thoracic spine.  IMPRESSION: Stable cardiomegaly, no acute pulmonary process, improved aeration of the lungs from December 11, 2013.   Electronically Signed   By: Elon Alas   On: 01/26/2014 22:49   Ct Head Wo Contrast  01/26/2014   CLINICAL DATA:  Acute onset dizziness, weakness, and unsteady gait earlier this evening, associated with double vision and nausea/vomiting.  EXAM: CT HEAD WITHOUT CONTRAST  TECHNIQUE: Contiguous axial images were obtained from the base of the skull through the vertex without intravenous contrast.  COMPARISON:  None.  FINDINGS: Ventricular system normal in size and appearance for age. No significant atrophy for age. No mass lesion. No midline shift. No  acute hemorrhage or hematoma. No extra-axial fluid collections. No evidence of acute infarction. No focal brain parenchymal abnormality.  No focal osseous abnormality involving the skull. Visualized paranasal sinuses, bilateral mastoid air cells, and bilateral middle ear cavities well-aerated. 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  IMPRESSION: 1. No acute intracranial abnormality. 2. 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 Non-emergent MRI of the brain and MRA of the intracranial vessels and cervical carotid and vertebral arteries may be helpful if this is a clinical consideration.   Electronically Signed   By: Evangeline Dakin M.D.   On: 01/26/2014 22:23    Assessment/Plan  58 yo male with bpv vs post cva/tia  Principal Problem:   Vertigo  Give some antivert.  zofran prn.  ivf overnight and repeat cr.  If improved will need to order cta head and neck.  Pt already anticoagulated with coumadin and therepautic.  freq neuro cks overnight.  Ck dig level also.  Active Problems:   CARDIOMYOPATHY, ISCHEMIC, with BiV ICD, st Jude EF 23%   PAF (paroxysmal atrial fibrillation)   Long term (current) use of anticoagulants   Chronic systolic heart failure   Obesity   Acute-on-chronic renal failure  FULL CODE.    Montreal Steidle A 01/26/2014, 11:01 PM

## 2014-01-26 NOTE — ED Provider Notes (Signed)
CSN: [id number]     Arrival date & time 01/26/14  2050 History  This chart was scribed for Ezequiel Essex, MD by Jenne Campus, ED Scribe. This patient was seen in room APA11/APA11 and the patient's care was started at 9:16 PM.   Chief Complaint  Patient presents with  . Dizziness  . Emesis     The history is provided by the patient. No language interpreter was used.    HPI Comments: [name] is a 58 y.o. male who presents to the Emergency Department complaining of a sudden onset of dizziness described as room spinning that started after he had gotten up from sitting in a chair about one hour ago. He states that the dizziness is improved with resting with his eyes closed and worse with standing. He admits to having a hard time ambulating due to feeling weak and unsteady. Since the onset, the dizziness has been constant. He reports associated nausea and 1 episode of emesis en route to the ED. Son also expresses concern over one episode of double vision earlier today that resolved after 5 to 10 minutes. Pt denies any double vision currently. Pt reports prior episodes that were less severe diagnosed as vertigo. He denies any h/o TIA/CVA. He reports a recent HA from a "bad tooth" but denies any current HA, CP, SOB, extremity weakness or numbness. He denies any recent illness, cough or fever. Pt has a h/o colon CA with colon resection and colostomy. Pt is currently on coumadin.    Past Medical History  Diagnosis Date  . Essential hypertension, benign   . Type 2 diabetes mellitus   . Coronary atherosclerosis of native coronary artery     BMS to LAD 2001 at [facility], PTCA/atherectomy ramus and BMS to LAD 2009  . S/P colonoscopy     Normal via ostomy - September 2009  . Chronic systolic heart failure   . Myocardial infarction, anterior wall     Treated with tPA at [facility] 2000  . GERD (gastroesophageal reflux disease)   . Cardiomyopathy, ischemic     BIV ICD St. Jude, LVEF 23%  . Paroxysmal  atrial fibrillation     Amiodarone and Coumadin  . 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 Prostate cancer   . 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 Transaminitis 12/12/2013  . CHF (congestive heart failure)    Past Surgical History  Procedure Laterality Date  . Internal defibrillator and pacemaker  2010    x2 St. Jude device  . Abdominal and perineal resection of rectum with total mesorectal excision      10/04/2007  . Colonoscopy      05/17/2007. IMPRESSION: Semilunar, apple-core neoplasm low in the rectum (palpable on digital rectal exam) beginning at 5 cm and corkscrewing up 5 cm in length. This was a low rectal lesion consistent with colorectal carcinoma. It was biosied multiple times. The upstream colon all the way to the cecum appeared normal. Recommendations: Followup on path. Surgical Consultation   . Colonoscopy  09/14/2011    Procedure: COLONOSCOPY;  Surgeon: Daneil Dolin, MD;  Location: [facility];  Service: Endoscopy;  Laterality: N/A;  8:30- TCS via colostomy & pt has defibrillator  . Colostomy     Family History  Problem Relation Age of Onset  . Colon cancer Mother 71  . Colon cancer Sister 9  . Coronary artery disease Father    History  Substance Use Topics  . Smoking  status: Never Smoker   . Smokeless tobacco: Never Used  . Alcohol Use: No     Comment: Former user    Review of Systems  A complete 10 system review of systems was obtained and all systems are negative except as noted in the HPI and PMH.   Allergies  Review of patient's allergies indicates no known allergies.  Home Medications   No current outpatient prescriptions on file. Triage Vitals: BP 110/72  Pulse 70  Temp(Src) 97.6 F (36.4 C) (Oral)  Resp 17  Ht 5\' 10"  (1.778 m)  Wt 205 lb (92.987 kg)  BMI 29.41 kg/m2  SpO2 100%  Physical Exam  Nursing note and vitals reviewed. Constitutional: He is oriented to person, place, and time. He appears  well-developed and well-nourished. No distress.  Prefers to keep eyes closed   HENT:  Head: Normocephalic and atraumatic.  Eyes: Conjunctivae and EOM are normal. Pupils are equal, round, and reactive to light.  Visual fields full to confrontation   Neck: Neck supple. No tracheal deviation present.  Cardiovascular: Normal rate and regular rhythm.   No murmur heard. Pulmonary/Chest: Effort normal and breath sounds normal. No respiratory distress.  Pacemaker in upper left chest  Abdominal: Soft. There is no tenderness. There is no rebound.  Colostomy in LLQ  Musculoskeletal: Normal range of motion. He exhibits no edema.  Intact peripheral pulses, no peripheral edema  Neurological: He is alert and oriented to person, place, and time.  CN 2-12 intact, no ataxia on finger to nose, no nystagmus, 5/5 strength throughout, no pronator drift  Skin: Skin is warm and dry.  Psychiatric: He has a normal mood and affect. His behavior is normal.    ED Course  Procedures (including critical care time)  DIAGNOSTIC STUDIES: Oxygen Saturation is 100% on RA, normal by my interpretation.    COORDINATION OF CARE: 9:25 PM-Discussed treatment plan which includes CT of head, CBC panel, CMP and troponin with pt at bedside and pt agreed to plan.   Labs Review Labs Reviewed  CBC WITH DIFFERENTIAL - Abnormal; Notable for the following:    Hemoglobin 12.3 (*)    HCT 38.1 (*)    Neutrophils Relative % 78 (*)    All other components within normal limits  COMPREHENSIVE METABOLIC PANEL - Abnormal; Notable for the following:    Sodium 130 (*)    Chloride 93 (*)    Glucose, Bld 367 (*)    BUN 30 (*)    Creatinine, Ser 1.66 (*)    Albumin 3.4 (*)    AST 41 (*)    GFR calc non Af Amer 44 (*)    GFR calc Af Amer 51 (*)    All other components within normal limits  PROTIME-INR - Abnormal; Notable for the following:    Prothrombin Time 25.3 (*)    INR 2.39 (*)    All other components within normal limits   PRO B NATRIURETIC PEPTIDE - Abnormal; Notable for the following:    Pro B Natriuretic peptide (BNP) 2348.0 (*)    All other components within normal limits  CBG MONITORING, ED - Abnormal; Notable for the following:    Glucose-Capillary 337 (*)    All other components within normal limits  TROPONIN I  ETHANOL  APTT  URINE RAPID DRUG SCREEN (HOSP PERFORMED)  URINALYSIS, ROUTINE W REFLEX MICROSCOPIC   Imaging Review Dg Chest 1 View  01/26/2014   CLINICAL DATA:  Dizziness, weakness and unsteady gait. Systolic heart failure.  EXAM: CHEST - 1 VIEW  COMPARISON:  DG CHEST 1V PORT dated 12/11/2013  FINDINGS: Cardiac silhouette remains moderately enlarged, mediastinal silhouette is nonsuspicious. Biventricular left cardiac defibrillator in situ. Lungs are clear, no pleural effusions or focal consolidations, improved aeration from prior examination. No pneumothorax.  Multiple EKG lines overlie the patient and may obscure subtle underlying pathology. Mild degenerative change of the thoracic spine.  IMPRESSION: Stable cardiomegaly, no acute pulmonary process, improved aeration of the lungs from December 11, 2013.   Electronically Signed   By: Elon Alas   On: 01/26/2014 22:49   Ct Head Wo Contrast  01/26/2014   CLINICAL DATA:  Acute onset dizziness, weakness, and unsteady gait earlier this evening, associated with double vision and nausea/vomiting.  EXAM: CT HEAD WITHOUT CONTRAST  TECHNIQUE: Contiguous axial images were obtained from the base of the skull through the vertex without intravenous contrast.  COMPARISON:  None.  FINDINGS: Ventricular system normal in size and appearance for age. No significant atrophy for age. No mass lesion. No midline shift. No acute hemorrhage or hematoma. No extra-axial fluid collections. No evidence of acute infarction. No focal brain parenchymal abnormality.  No focal osseous abnormality involving the skull. Visualized paranasal sinuses, bilateral mastoid air cells, and  bilateral middle ear cavities well-aerated. 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  IMPRESSION: 1. No acute intracranial abnormality. 2. 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 Non-emergent MRI of the brain and MRA of the intracranial vessels and cervical carotid and vertebral arteries may be helpful if this is a clinical consideration.   Electronically Signed   By: Evangeline Dakin M.D.   On: 01/26/2014 22:23      MDM   Final diagnoses:  Vertigo   acute onset of vertigo and dizziness with nausea and vomiting onset this patient stood up from a chair. This is worse with position change. Denies any speech change, focal weakness, numbness or tingling. No headache. No vision change.  Neurological exam is nonfocal. Patient is unable to stand or walk due to dizziness. do not suspect acute CVA as vertigo appears to be sudden onset and positional.  Patient not tPA candidate anyway 2/2 coumadin use.  EKG is paced and unchanged. INR therapeutic. Hyperglycemia without evidence of DKA. CT scan shows no hemorrhage. Vertebral artery atherosclerosis suggests possibility of vertebrobasilar insufficiency causing symptoms.  Neurology consult complete by Dr. Geraldine Solar. He suspects probable peripheral vertigo component. Cannot rule out CVA or TIA. Admission recommended for workup. Patient's creatinine is 1.67 is unable to receive IV contrast were CTA. Neurologist feels patient is not a candidate for intervention as his NIH scale is very low and he is improving. He recommends MRI and MRA. Patient unable to get these studies due to AICD. Dizziness has improved in the ED.  D/w Dr. Shanon Brow who agrees to admit to r/o CVA. Will hydrate gently overnight to attempt to improve kidney function for CTA tomorrow.  ASA given.  I personally  performed the services described in this documentation, which was scribed in my presence. The recorded information has been reviewed and is accurate.      Ezequiel Essex, MD 01/27/14 [id number]

## 2014-01-26 NOTE — ED Notes (Signed)
Patient reports dizziness and vomiting that started approximately 45 minutes ago. Denies any pain or shortness of breath

## 2014-01-26 NOTE — ED Notes (Signed)
Tele neurology consult complete.

## 2014-01-27 DIAGNOSIS — I4891 Unspecified atrial fibrillation: Secondary | ICD-10-CM | POA: Diagnosis not present

## 2014-01-27 DIAGNOSIS — E1165 Type 2 diabetes mellitus with hyperglycemia: Secondary | ICD-10-CM | POA: Diagnosis not present

## 2014-01-27 DIAGNOSIS — E1129 Type 2 diabetes mellitus with other diabetic kidney complication: Secondary | ICD-10-CM | POA: Diagnosis not present

## 2014-01-27 DIAGNOSIS — I5022 Chronic systolic (congestive) heart failure: Secondary | ICD-10-CM | POA: Diagnosis not present

## 2014-01-27 DIAGNOSIS — IMO0002 Reserved for concepts with insufficient information to code with codable children: Secondary | ICD-10-CM | POA: Diagnosis present

## 2014-01-27 DIAGNOSIS — R42 Dizziness and giddiness: Secondary | ICD-10-CM | POA: Diagnosis not present

## 2014-01-27 LAB — CBC
HEMATOCRIT: 35.3 % — AB (ref 39.0–52.0)
Hemoglobin: 11.7 g/dL — ABNORMAL LOW (ref 13.0–17.0)
MCH: 27.5 pg (ref 26.0–34.0)
MCHC: 33.1 g/dL (ref 30.0–36.0)
MCV: 83.1 fL (ref 78.0–100.0)
Platelets: 159 10*3/uL (ref 150–400)
RBC: 4.25 MIL/uL (ref 4.22–5.81)
RDW: 15.4 % (ref 11.5–15.5)
WBC: 6.5 10*3/uL (ref 4.0–10.5)

## 2014-01-27 LAB — TROPONIN I: Troponin I: <0.3 ng/mL (ref <0.30)

## 2014-01-27 LAB — TSH: TSH: 1.383 u[IU]/mL (ref 0.350–4.500)

## 2014-01-27 LAB — GLUCOSE, CAPILLARY
GLUCOSE-CAPILLARY: 202 mg/dL — AB (ref 70–99)
GLUCOSE-CAPILLARY: 284 mg/dL — AB (ref 70–99)

## 2014-01-27 LAB — BASIC METABOLIC PANEL
BUN: 28 mg/dL — ABNORMAL HIGH (ref 6–23)
CALCIUM: 8.9 mg/dL (ref 8.4–10.5)
CHLORIDE: 96 meq/L (ref 96–112)
CO2: 25 meq/L (ref 19–32)
Creatinine, Ser: 1.51 mg/dL — ABNORMAL HIGH (ref 0.50–1.35)
GFR calc Af Amer: 57 mL/min — ABNORMAL LOW (ref >90)
GFR calc non Af Amer: 49 mL/min — ABNORMAL LOW (ref >90)
GLUCOSE: 273 mg/dL — AB (ref 70–99)
Potassium: 4.5 mEq/L (ref 3.7–5.3)
Sodium: 132 mEq/L — ABNORMAL LOW (ref 137–147)

## 2014-01-27 LAB — PROTIME-INR
INR: 2.55 — ABNORMAL HIGH (ref 0.00–1.49)
Prothrombin Time: 26.6 seconds — ABNORMAL HIGH (ref 11.6–15.2)

## 2014-01-27 LAB — DIGOXIN LEVEL: DIGOXIN LVL: 1.1 ng/mL (ref 0.8–2.0)

## 2014-01-27 MED ORDER — ONDANSETRON HCL 4 MG/2ML IJ SOLN: 4.0000 mg | Freq: Four times a day (QID) | INTRAMUSCULAR | Status: DC | PRN

## 2014-01-27 MED ORDER — INSULIN ASPART 100 UNIT/ML ~~LOC~~ SOLN
14.0000 [IU] | Freq: Every day | SUBCUTANEOUS | Status: DC
  Administered 2014-01-27 – 2014-01-29 (×3): 14 [IU] via SUBCUTANEOUS

## 2014-01-27 MED ORDER — ONDANSETRON HCL 4 MG PO TABS: 4.0000 mg | ORAL_TABLET | Freq: Four times a day (QID) | ORAL | Status: DC | PRN

## 2014-01-27 MED ORDER — SPIRONOLACTONE 25 MG PO TABS
25.0000 mg | ORAL_TABLET | Freq: Every day | ORAL | Status: DC
  Administered 2014-01-27 – 2014-01-29 (×3): 25 mg via ORAL
  Filled 2014-01-27 (×3): qty 1

## 2014-01-27 MED ORDER — AMIODARONE HCL 200 MG PO TABS
200.0000 mg | ORAL_TABLET | Freq: Two times a day (BID) | ORAL | Status: DC
  Administered 2014-01-27 – 2014-01-29 (×5): 200 mg via ORAL
  Filled 2014-01-27 (×5): qty 1

## 2014-01-27 MED ORDER — SODIUM CHLORIDE 0.9 % IV SOLN
INTRAVENOUS | Status: AC
  Administered 2014-01-27: 01:00:00 via INTRAVENOUS

## 2014-01-27 MED ORDER — FLUTICASONE PROPIONATE 50 MCG/ACT NA SUSP
1.0000 | Freq: Every day | NASAL | Status: DC | PRN
  Filled 2014-01-27: qty 16

## 2014-01-27 MED ORDER — CITALOPRAM HYDROBROMIDE 20 MG PO TABS
20.0000 mg | ORAL_TABLET | Freq: Every day | ORAL | Status: DC
  Administered 2014-01-27 – 2014-01-29 (×3): 20 mg via ORAL
  Filled 2014-01-27 (×3): qty 1

## 2014-01-27 MED ORDER — INSULIN GLARGINE 100 UNIT/ML ~~LOC~~ SOLN
24.0000 [IU] | Freq: Every day | SUBCUTANEOUS | Status: DC
  Administered 2014-01-27 (×2): 24 [IU] via SUBCUTANEOUS
  Filled 2014-01-27 (×4): qty 0.24

## 2014-01-27 MED ORDER — METOPROLOL SUCCINATE ER 50 MG PO TB24: 75.0000 mg | ORAL_TABLET | Freq: Every day | ORAL | Status: DC

## 2014-01-27 MED ORDER — SODIUM CHLORIDE 0.9 % IJ SOLN
3.0000 mL | Freq: Two times a day (BID) | INTRAMUSCULAR | Status: DC
  Administered 2014-01-28 – 2014-01-29 (×3): 3 mL via INTRAVENOUS

## 2014-01-27 MED ORDER — WARFARIN - PHYSICIAN DOSING INPATIENT
Freq: Every day | Status: DC
  Administered 2014-01-28: 18:00:00

## 2014-01-27 MED ORDER — MECLIZINE HCL 12.5 MG PO TABS: 25.0000 mg | ORAL_TABLET | Freq: Two times a day (BID) | ORAL | Status: DC | PRN

## 2014-01-27 MED ORDER — DIGOXIN 125 MCG PO TABS
0.1250 mg | ORAL_TABLET | ORAL | Status: DC
  Administered 2014-01-28: 0.125 mg via ORAL
  Filled 2014-01-27: qty 1

## 2014-01-27 MED ORDER — ISOSORBIDE MONONITRATE ER 30 MG PO TB24
15.0000 mg | ORAL_TABLET | Freq: Two times a day (BID) | ORAL | Status: DC
  Administered 2014-01-27 – 2014-01-29 (×5): 15 mg via ORAL
  Filled 2014-01-27 (×5): qty 1

## 2014-01-27 MED ORDER — RANOLAZINE ER 500 MG PO TB12
1000.0000 mg | ORAL_TABLET | Freq: Two times a day (BID) | ORAL | Status: DC
  Administered 2014-01-27 – 2014-01-29 (×5): 1000 mg via ORAL
  Filled 2014-01-27 (×7): qty 2

## 2014-01-27 MED ORDER — METOPROLOL SUCCINATE ER 50 MG PO TB24
100.0000 mg | ORAL_TABLET | Freq: Every day | ORAL | Status: DC
  Administered 2014-01-27: 100 mg via ORAL
  Filled 2014-01-27: qty 2

## 2014-01-27 MED ORDER — PANTOPRAZOLE SODIUM 40 MG PO TBEC
40.0000 mg | DELAYED_RELEASE_TABLET | Freq: Every day | ORAL | Status: DC
  Administered 2014-01-27 – 2014-01-29 (×3): 40 mg via ORAL
  Filled 2014-01-27 (×3): qty 1

## 2014-01-27 MED ORDER — CITALOPRAM HYDROBROMIDE 20 MG PO TABS: 10.0000 mg | ORAL_TABLET | Freq: Every day | ORAL | Status: DC

## 2014-01-27 MED ORDER — ASPIRIN EC 81 MG PO TBEC
81.0000 mg | DELAYED_RELEASE_TABLET | Freq: Every day | ORAL | Status: DC
  Administered 2014-01-27 – 2014-01-28 (×2): 81 mg via ORAL
  Filled 2014-01-27 (×3): qty 1

## 2014-01-27 MED ORDER — WARFARIN SODIUM 2.5 MG PO TABS
2.5000 mg | ORAL_TABLET | Freq: Every day | ORAL | Status: DC
  Administered 2014-01-27 – 2014-01-28 (×2): 2.5 mg via ORAL
  Filled 2014-01-27 (×2): qty 1

## 2014-01-27 MED ORDER — INSULIN ASPART 100 UNIT/ML ~~LOC~~ SOLN
12.0000 [IU] | Freq: Every day | SUBCUTANEOUS | Status: DC
  Administered 2014-01-27 – 2014-01-28 (×2): 12 [IU] via SUBCUTANEOUS

## 2014-01-27 MED ORDER — INSULIN ASPART 100 UNIT/ML ~~LOC~~ SOLN
12.0000 [IU] | Freq: Every day | SUBCUTANEOUS | Status: DC
  Administered 2014-01-27 – 2014-01-29 (×3): 12 [IU] via SUBCUTANEOUS

## 2014-01-27 NOTE — Progress Notes (Signed)
Utilization review completed.  

## 2014-01-27 NOTE — Progress Notes (Signed)
PROGRESS NOTE    KODE HEMMEN ZOX:[id number] DOB: [birth year] DOA: 01/26/2014 PCP: Cassell Smiles., MD  HPI/Brief narrative 58 year old male with history of hypertension, type II DM with renal complications, CAD status post stent, chronic systolic CHF, ischemic cardiomyopathy, dual ICD, PAF on amiodarone and Coumadin, presented to the ED on 01/26/14 with complaints of acute on chronic dizziness. He states that he has been having dizziness for approximately 6 months which happened only in the upright position-many times when he suddenly stands up to run behind his grandchildren. He denies vertigo. Yesterday the symptoms seemed to get worse with associated nausea, vomiting and he nearly passed out. In the ED CT head was negative for acute findings.  Assessment/Plan:  1. Dizziness, acute on chronic: May be secondary to hypotension/orthostatic hypotension. CT head shows severe bilateral carotid siphon and vertebral artery arthrosclerosis-VBI a possibility. Unable to perform MRI brain and neck secondary to AICD. Unable to perform CTA head and neck secondary to renal insufficiency. Clinically appears euvolemic. Blood pressures however soft-may consider cutting back on metoprolol. Symptomatically better today. Neurology consultation in a.m. 2. Chronic systolic CHF/ischemic cardiomyopathy/CAD status post stent/dual ICD/PAF: LVEF 30-35% in September 2014. Clinically compensated. Telemetry with paced rhythm. Will discuss with cardiology in a.m. regarding cutting back on metoprolol dose and need to interrogate ICD. Continue amiodarone, aspirin, digoxin, nitrates, Ranexa and Coumadin per pharmacy. Anticoagulated. 3. Uncontrolled type II DM with renal complications: Continue insulins and monitor. 4. Anemia: Likely chronic kidney disease. Stable. 5. Stage III chronic kidney disease: Baseline creatinine is probably in the 1.4-1.6 range.   Code Status: Full Family Communication: None at bedside. Disposition  Plan: Home in medically stable   Consultants:  None  Procedures:  None  Antibiotics:  None   Subjective: Dizziness has resolved today. Denies chest pain or dyspnea.  Objective: Filed Vitals:   01/27/14 1110 01/27/14 1112 01/27/14 1115 01/27/14 1434  BP: 95/56 95/64 86/61  90/60  Pulse: 88 80 81 78  Temp:    97.5 F (36.4 C)  TempSrc:    Oral  Resp: 18 18 18 18   Height:      Weight:      SpO2: 99% 99% 99% 95%    Intake/Output Summary (Last 24 hours) at 01/27/14 1709 Last data filed at 01/27/14 1346  Gross per 24 hour  Intake  592.5 ml  Output      0 ml  Net  592.5 ml   Filed Weights   01/26/14 2102 01/27/14 0500  Weight: 92.987 kg (205 lb) 97 kg (213 lb 13.5 oz)     Exam:  General exam: Pleasant middle-aged male lying comfortably in bed. Respiratory system: Clear. No increased work of breathing. Cardiovascular system: S1 & S2 heard, RRR. No JVD, murmurs, gallops, clicks or pedal edema. Telemetry: Paced rhythm. Gastrointestinal system: Abdomen is nondistended, soft and nontender. Normal bowel sounds heard. Central nervous system: Alert and oriented. No focal neurological deficits. Extremities: Symmetric 5 x 5 power.   Data Reviewed: Basic Metabolic Panel:  Recent Labs Lab 01/26/14 2127 01/27/14 0321  NA 130* 132*  K 4.1 4.5  CL 93* 96  CO2 22 25  GLUCOSE 367* 273*  BUN 30* 28*  CREATININE 1.66* 1.51*  CALCIUM 9.2 8.9   Liver Function Tests:  Recent Labs Lab 01/26/14 2127  AST 41*  ALT 39  ALKPHOS 101  BILITOT 0.8  PROT 8.0  ALBUMIN 3.4*   No results found for this basename: LIPASE, AMYLASE,  in the last 168 hours  No results found for this basename: AMMONIA,  in the last 168 hours CBC:  Recent Labs Lab 01/26/14 2127 01/27/14 0321  WBC 7.9 6.5  NEUTROABS 6.2  --   HGB 12.3* 11.7*  HCT 38.1* 35.3*  MCV 83.4 83.1  PLT 167 159   Cardiac Enzymes:  Recent Labs Lab 01/26/14 2127 01/27/14 0321 01/27/14 0946 01/27/14 1527    TROPONINI <0.30 <0.30 <0.30 <0.30   BNP (last 3 results)  Recent Labs  12/08/13 0747 12/11/13 1140 01/26/14 2128  PROBNP 7155.0* 3985.0* 2348.0*   CBG:  Recent Labs Lab 01/26/14 2152 01/27/14 0005 01/27/14 1631  GLUCAP 337* 284* 202*    No results found for this or any previous visit (from the past 240 hour(s)).    Additional labs: 1. Digoxin level: 1.1 2. Blood alcohol level <11     Studies: Dg Chest 1 View  01/26/2014   CLINICAL DATA:  Dizziness, weakness and unsteady gait. Systolic heart failure.  EXAM: CHEST - 1 VIEW  COMPARISON:  DG CHEST 1V PORT dated 12/11/2013  FINDINGS: Cardiac silhouette remains moderately enlarged, mediastinal silhouette is nonsuspicious. Biventricular left cardiac defibrillator in situ. Lungs are clear, no pleural effusions or focal consolidations, improved aeration from prior examination. No pneumothorax.  Multiple EKG lines overlie the patient and may obscure subtle underlying pathology. Mild degenerative change of the thoracic spine.  IMPRESSION: Stable cardiomegaly, no acute pulmonary process, improved aeration of the lungs from December 11, 2013.   Electronically Signed   By: Awilda Metro   On: 01/26/2014 22:49   Ct Head Wo Contrast  01/26/2014   CLINICAL DATA:  Acute onset dizziness, weakness, and unsteady gait earlier this evening, associated with double vision and nausea/vomiting.  EXAM: CT HEAD WITHOUT CONTRAST  TECHNIQUE: Contiguous axial images were obtained from the base of the skull through the vertex without intravenous contrast.  COMPARISON:  None.  FINDINGS: Ventricular system normal in size and appearance for age. No significant atrophy for age. No mass lesion. No midline shift. No acute hemorrhage or hematoma. No extra-axial fluid collections. No evidence of acute infarction. No focal brain parenchymal abnormality.  No focal osseous abnormality involving the skull. Visualized paranasal sinuses, bilateral mastoid air cells, and  bilateral middle ear cavities well-aerated. 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  IMPRESSION: 1. No acute intracranial abnormality. 2. 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 Non-emergent MRI of the brain and MRA of the intracranial vessels and cervical carotid and vertebral arteries may be helpful if this is a clinical consideration.   Electronically Signed   By: Hulan Saas M.D.   On: 01/26/2014 22:23        Scheduled Meds: . amiodarone  200 mg Oral BID  . aspirin EC  81 mg Oral QHS  . citalopram  20 mg Oral Daily  . [START ON 01/28/2014] digoxin  0.125 mg Oral QODAY  . insulin aspart  12 Units Subcutaneous Q lunch   And  . insulin aspart  12 Units Subcutaneous Q supper  . insulin aspart  14 Units Subcutaneous Q breakfast  . insulin glargine  24 Units Subcutaneous QHS  . isosorbide mononitrate  15 mg Oral BID  . metoprolol succinate  100 mg Oral Daily  . pantoprazole  40 mg Oral Daily  . ranolazine  1,000 mg Oral BID  . sodium chloride  3 mL Intravenous Q12H  .  spironolactone  25 mg Oral Daily  . warfarin  2.5 mg Oral q1800  . Warfarin - Physician Dosing Inpatient   Does not apply q1800   Continuous Infusions:    Principal Problem:   Vertigo Active Problems:   CARDIOMYOPATHY, ISCHEMIC, with BiV ICD, st Jude EF 23%   PAF (paroxysmal atrial fibrillation)   Long term (current) use of anticoagulants   Chronic systolic heart failure   Obesity   Acute-on-chronic renal failure    Time spent: 45 minutes    Jaeger Trueheart, MD, FACP, FHM. [facility] Pager [phone]  If 7PM-7AM, please contact night-coverage [email or web address] Password [email or web address] 01/27/2014, 5:09 PM    LOS: 1 day

## 2014-01-27 NOTE — Progress Notes (Signed)
Did orthostatic vital signs on pt. From sitting to standing his orthostatic vital signs are positive. Pt did not report feeling dizzy or lightheadedness during checks. Will continue to monitor.

## 2014-01-28 ENCOUNTER — Observation Stay (HOSPITAL_COMMUNITY): Payer: Medicare Other

## 2014-01-28 DIAGNOSIS — I69993 Ataxia following unspecified cerebrovascular disease: Secondary | ICD-10-CM | POA: Diagnosis not present

## 2014-01-28 DIAGNOSIS — I6340 Cerebral infarction due to embolism of unspecified cerebral artery: Secondary | ICD-10-CM | POA: Diagnosis not present

## 2014-01-28 DIAGNOSIS — R930 Abnormal findings on diagnostic imaging of skull and head, not elsewhere classified: Secondary | ICD-10-CM | POA: Diagnosis not present

## 2014-01-28 DIAGNOSIS — R471 Dysarthria and anarthria: Secondary | ICD-10-CM | POA: Diagnosis not present

## 2014-01-28 DIAGNOSIS — N189 Chronic kidney disease, unspecified: Secondary | ICD-10-CM | POA: Diagnosis not present

## 2014-01-28 DIAGNOSIS — I959 Hypotension, unspecified: Secondary | ICD-10-CM | POA: Diagnosis not present

## 2014-01-28 DIAGNOSIS — I658 Occlusion and stenosis of other precerebral arteries: Secondary | ICD-10-CM | POA: Diagnosis not present

## 2014-01-28 DIAGNOSIS — E1129 Type 2 diabetes mellitus with other diabetic kidney complication: Secondary | ICD-10-CM | POA: Diagnosis not present

## 2014-01-28 DIAGNOSIS — R42 Dizziness and giddiness: Secondary | ICD-10-CM | POA: Diagnosis not present

## 2014-01-28 LAB — URINALYSIS, ROUTINE W REFLEX MICROSCOPIC
Bilirubin Urine: NEGATIVE
Glucose, UA: 500 mg/dL — AB
Hgb urine dipstick: NEGATIVE
Ketones, ur: NEGATIVE mg/dL
Leukocytes, UA: NEGATIVE
Nitrite: NEGATIVE
Protein, ur: NEGATIVE mg/dL
Specific Gravity, Urine: 1.025 (ref 1.005–1.030)
Urobilinogen, UA: 1 mg/dL (ref 0.0–1.0)
pH: 6 (ref 5.0–8.0)

## 2014-01-28 LAB — PROTIME-INR
INR: 2.66 — AB (ref 0.00–1.49)
PROTHROMBIN TIME: 27.4 s — AB (ref 11.6–15.2)

## 2014-01-28 LAB — RAPID URINE DRUG SCREEN, HOSP PERFORMED
Amphetamines: NOT DETECTED
Barbiturates: NOT DETECTED
Benzodiazepines: NOT DETECTED
Cocaine: NOT DETECTED
Opiates: NOT DETECTED
Tetrahydrocannabinol: NOT DETECTED

## 2014-01-28 LAB — BASIC METABOLIC PANEL
BUN: 22 mg/dL (ref 6–23)
CALCIUM: 9 mg/dL (ref 8.4–10.5)
CO2: 26 mEq/L (ref 19–32)
CREATININE: 1.43 mg/dL — AB (ref 0.50–1.35)
Chloride: 104 mEq/L (ref 96–112)
GFR calc non Af Amer: 52 mL/min — ABNORMAL LOW (ref >90)
GFR, EST AFRICAN AMERICAN: 60 mL/min — AB (ref >90)
Glucose, Bld: 105 mg/dL — ABNORMAL HIGH (ref 70–99)
Potassium: 4.2 mEq/L (ref 3.7–5.3)
Sodium: 139 mEq/L (ref 137–147)

## 2014-01-28 LAB — GLUCOSE, CAPILLARY
GLUCOSE-CAPILLARY: 150 mg/dL — AB (ref 70–99)
GLUCOSE-CAPILLARY: 135 mg/dL — AB (ref 70–99)
Glucose-Capillary: 107 mg/dL — ABNORMAL HIGH (ref 70–99)
Glucose-Capillary: 255 mg/dL — ABNORMAL HIGH (ref 70–99)
Glucose-Capillary: 107 mg/dL — ABNORMAL HIGH (ref 70–99)

## 2014-01-28 MED ORDER — METOPROLOL SUCCINATE ER 50 MG PO TB24
50.0000 mg | ORAL_TABLET | Freq: Every day | ORAL | Status: DC
  Administered 2014-01-28 – 2014-01-29 (×2): 50 mg via ORAL
  Filled 2014-01-28 (×2): qty 1

## 2014-01-28 NOTE — Progress Notes (Signed)
PROGRESS NOTE    [name] GEX:[id number] DOB: 07-13-1956 DOA: 01/26/2014 PCP: Cassell Smiles., MD  HPI/Brief narrative 58 year old male with history of hypertension, type II DM with renal complications, CAD status post stent, chronic systolic CHF, ischemic cardiomyopathy, dual ICD, PAF on amiodarone and Coumadin, presented to the ED on 01/26/14 with complaints of acute on chronic dizziness. He states that he has been having dizziness for approximately 6 months which happened only in the upright position-many times when he suddenly stands up to run behind his grandchildren. He denies vertigo. Yesterday the symptoms seemed to get worse with associated nausea, vomiting and he nearly passed out. In the ED CT head was negative for acute findings.  Assessment/Plan:  1. Dizziness, acute on chronic: May be secondary to hypotension/orthostatic hypotension. CT head shows severe bilateral carotid siphon and vertebral artery arthrosclerosis-VBI a possibility. Unable to perform MRI brain and neck secondary to AICD. Unable to perform CTA head and neck secondary to renal insufficiency. Clinically appears euvolemic. Blood pressures however soft-after discussing with cardiology, reduced Toprol-XL from 100 mg >50 mg daily. Symptoms seem to have resolved. Neurology consulted- repeat CT head negative and carotid Dopplers show less than 50% bilateral ICA stenosis. 2. Chronic systolic CHF/ischemic cardiomyopathy/CAD status post stent/dual ICD/PAF: LVEF 30-35% in September 2014. Clinically compensated. Telemetry with paced rhythm. Continue amiodarone, aspirin, digoxin, nitrates, Ranexa and Coumadin per pharmacy. Anticoagulated. Discussed with Dr. Purvis Sheffield, Cardiology who reviewed chart and indicated that patient's device was interrogated in the last 6 weeks and was OK.  3. Uncontrolled type II DM with renal complications: Continue insulins and monitor. Fluctuating. 4. Anemia: Likely chronic kidney disease.  Stable. 5. Stage III chronic kidney disease: Baseline creatinine is probably in the 1.4-1.6 range.   Code Status: Full Family Communication: None at bedside. Disposition Plan: Home in medically stable   Consultants:  Neurology  Procedures:  None  Antibiotics:  None   Subjective: Patient states that his dizziness has resolved.  Objective: Filed Vitals:   01/28/14 1105 01/28/14 1108 01/28/14 1117 01/28/14 1526  BP: 100/62 90/68 82/62  97/62  Pulse:    81  Temp:    97.7 F (36.5 C)  TempSrc:    Oral  Resp:    20  Height:      Weight:      SpO2:    98%    Intake/Output Summary (Last 24 hours) at 01/28/14 1658 Last data filed at 01/28/14 1245  Gross per 24 hour  Intake    600 ml  Output    700 ml  Net   -100 ml   Filed Weights   01/26/14 2102 01/27/14 0500 01/28/14 0604  Weight: 92.987 kg (205 lb) 97 kg (213 lb 13.5 oz) 96.7 kg (213 lb 3 oz)     Exam:  General exam: Pleasant middle-aged male lying comfortably in bed. Respiratory system: Clear. No increased work of breathing. Cardiovascular system: S1 & S2 heard, RRR. No JVD, murmurs, gallops, clicks or pedal edema. Telemetry: Paced rhythm. Gastrointestinal system: Abdomen is nondistended, soft and nontender. Normal bowel sounds heard. Central nervous system: Alert and oriented. No focal neurological deficits. Extremities: Symmetric 5 x 5 power.   Data Reviewed: Basic Metabolic Panel:  Recent Labs Lab 01/26/14 2127 01/27/14 0321 01/28/14 0522  NA 130* 132* 139  K 4.1 4.5 4.2  CL 93* 96 104  CO2 22 25 26   GLUCOSE 367* 273* 105*  BUN 30* 28* 22  CREATININE 1.66* 1.51* 1.43*  CALCIUM 9.2 8.9 9.0  Liver Function Tests:  Recent Labs Lab 01/26/14 2127  AST 41*  ALT 39  ALKPHOS 101  BILITOT 0.8  PROT 8.0  ALBUMIN 3.4*   No results found for this basename: LIPASE, AMYLASE,  in the last 168 hours No results found for this basename: AMMONIA,  in the last 168 hours CBC:  Recent Labs Lab  01/26/14 2127 01/27/14 0321  WBC 7.9 6.5  NEUTROABS 6.2  --   HGB 12.3* 11.7*  HCT 38.1* 35.3*  MCV 83.4 83.1  PLT 167 159   Cardiac Enzymes:  Recent Labs Lab 01/26/14 2127 01/27/14 0321 01/27/14 0946 01/27/14 1527  TROPONINI <0.30 <0.30 <0.30 <0.30   BNP (last 3 results)  Recent Labs  12/08/13 0747 12/11/13 1140 01/26/14 2128  PROBNP 7155.0* 3985.0* 2348.0*   CBG:  Recent Labs Lab 01/27/14 1631 01/27/14 2043 01/28/14 0805 01/28/14 1150 01/28/14 1637  GLUCAP 202* 150* 107* 255* 135*    No results found for this or any previous visit (from the past 240 hour(s)).    Additional labs: 1. Digoxin level: 1.1 2. Blood alcohol level <11     Studies: Dg Chest 1 View  01/26/2014   CLINICAL DATA:  Dizziness, weakness and unsteady gait. Systolic heart failure.  EXAM: CHEST - 1 VIEW  COMPARISON:  DG CHEST 1V PORT dated 12/11/2013  FINDINGS: Cardiac silhouette remains moderately enlarged, mediastinal silhouette is nonsuspicious. Biventricular left cardiac defibrillator in situ. Lungs are clear, no pleural effusions or focal consolidations, improved aeration from prior examination. No pneumothorax.  Multiple EKG lines overlie the patient and may obscure subtle underlying pathology. Mild degenerative change of the thoracic spine.  IMPRESSION: Stable cardiomegaly, no acute pulmonary process, improved aeration of the lungs from December 11, 2013.   Electronically Signed   By: Awilda Metro   On: 01/26/2014 22:49   Ct Head Wo Contrast  01/28/2014   CLINICAL DATA:  Followup for ataxia.  History of colon carcinoma.  EXAM: CT HEAD WITHOUT CONTRAST  TECHNIQUE: Contiguous axial images were obtained from the base of the skull through the vertex without intravenous contrast.  COMPARISON:  01/26/2014  FINDINGS: Ventricles are normal size for this patient's age and normal in configuration.  No parenchymal masses or mass effect. There are no areas of abnormal parenchymal attenuation. No  evidence of a cortical infarct.  There are no extra-axial masses or abnormal fluid collections. Skullbase vascular calcifications described previously are stable.  No intracranial hemorrhage.  Visualized sinuses, mastoid air cells and middle ear cavities are clear.  IMPRESSION: 1. No acute intracranial abnormalities. No change from the prior study.   Electronically Signed   By: Amie Portland M.D.   On: 01/28/2014 14:43   Ct Head Wo Contrast  01/26/2014   CLINICAL DATA:  Acute onset dizziness, weakness, and unsteady gait earlier this evening, associated with double vision and nausea/vomiting.  EXAM: CT HEAD WITHOUT CONTRAST  TECHNIQUE: Contiguous axial images were obtained from the base of the skull through the vertex without intravenous contrast.  COMPARISON:  None.  FINDINGS: Ventricular system normal in size and appearance for age. No significant atrophy for age. No mass lesion. No midline shift. No acute hemorrhage or hematoma. No extra-axial fluid collections. No evidence of acute infarction. No focal brain parenchymal abnormality.  No focal osseous abnormality involving the skull. Visualized paranasal sinuses, bilateral mastoid air cells, and bilateral middle ear cavities well-aerated. 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  IMPRESSION: 1. No acute intracranial abnormality. 2. 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 Non-emergent MRI of the brain and MRA of the intracranial vessels and cervical carotid and vertebral arteries may be helpful if this is a clinical consideration.   Electronically Signed   By: Hulan Saas M.D.   On: 01/26/2014 22:23   US Carotid Duplex Bilateral  01/28/2014   CLINICAL DATA:  Dizziness, coronary artery disease, diabetes.  EXAM: BILATERAL CAROTID DUPLEX ULTRASOUND  TECHNIQUE: Wallace Cullens scale  imaging, color Doppler and duplex ultrasound was performed of bilateral carotid and vertebral arteries in the neck.  COMPARISON:  None.  REVIEW OF SYSTEMS: Quantification of carotid stenosis is based on velocity parameters that correlate the residual internal carotid diameter with NASCET-based stenosis levels, using the diameter of the distal internal carotid lumen as the denominator for stenosis measurement.  The following velocity measurements were obtained:  PEAK SYSTOLIC/END DIASTOLIC  RIGHT  ICA:                     105/28cm/sec  CCA:                     75/20cm/sec  SYSTOLIC ICA/CCA RATIO:  1.39  DIASTOLIC ICA/CCA RATIO: 1.37  ECA:                     61cm/sec  LEFT  ICA:                     81/26cm/sec  CCA:                     80/21cm/sec  SYSTOLIC ICA/CCA RATIO:  1.01  DIASTOLIC ICA/CCA RATIO: 1.22  ECA:                     82cm/sec  FINDINGS: RIGHT CAROTID ARTERY: Smooth on calcified plaque effaces the carotid bulb and extends into the proximal ICA resulting in at least mild stenosis. Normal waveforms and color Doppler signal.  RIGHT VERTEBRAL ARTERY:  Normal flow direction and waveform.  LEFT CAROTID ARTERY: Circumferential partially calcified plaque in the proximal ICA resulting in at least mild stenosis. Normal waveforms and color Doppler signal.  LEFT VERTEBRAL ARTERY: Normal flow direction and waveform.  IMPRESSION: 1. 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 The exam does not exclude plaque ulceration or embolization. Continued surveillance recommended.   Electronically Signed   By: Oley Balm M.D.   On: 01/28/2014 13:52        Scheduled Meds: . amiodarone  200 mg Oral BID  . aspirin EC  81 mg Oral QHS  . citalopram  20 mg Oral Daily  . digoxin  0.125 mg Oral QODAY  . insulin aspart  12 Units Subcutaneous Q lunch   And  . insulin aspart  12 Units Subcutaneous Q supper  . insulin aspart  14 Units Subcutaneous Q breakfast  . insulin glargine  24  Units Subcutaneous QHS  . isosorbide mononitrate  15 mg Oral BID  . metoprolol succinate  50 mg Oral Daily  . pantoprazole  40 mg Oral Daily  . ranolazine  1,000 mg Oral BID  . sodium chloride  3 mL Intravenous Q12H  . spironolactone  25 mg Oral Daily  . warfarin  2.5 mg Oral q1800  . Warfarin - Physician Dosing Inpatient   Does not apply [phone]  Continuous Infusions:    Principal Problem:   Dizziness Active Problems:   CARDIOMYOPATHY, ISCHEMIC, with BiV ICD, st Jude EF 23%   PAF (paroxysmal atrial fibrillation)   Long term (current) use of anticoagulants   Chronic systolic heart failure   Obesity   Acute-on-chronic renal failure   DM type 2, uncontrolled, with renal complications    Time spent: 25 minutes    Robert Goetzke, MD, FACP, FHM. [facility] Pager [phone]  If 7PM-7AM, please contact night-coverage [email or web address] Password [facility] 01/28/2014, 4:58 PM    LOS: 2 days

## 2014-01-28 NOTE — Consult Note (Signed)
[place] A. Merlene Laughter, MD     [email or web address]          [name] is an 58 y.o. male.   ASSESSMENT/PLAN: 1. Acute onset of gait instability/ataxia, dysarthria and weakness of the upper and lower extremities along with some visual problems. The picture is concerning for posterior circulation ischemic event especially given his comorbidities which includes hypertension, Atrial fibrillation, uncontrolled diabetes and coronary disease. The patient consequently will be worked up with a repeat head CT scan to see if an infarct can be seen on his repeat imaging. We recommend that he be given aspirin in addition to his warfarin therapy. This is recommended for up to 1 month. A carotid duplex Doppler is also recommended. He should continue with risk factor modification including controlling hypertension, diabetes, use of aspirin and statin. It is also reasonable for the patient being evaluated for obstructive sleep apnea syndrome if this has not been done.   This is a 58 year old white male who presented to the hospital with a rather acute and severe onset of gait ataxia/disequilibrium, weakness of the upper and lower extremities along with what appears to be incoordination. The patient does have a history of episodic dizziness which happened on standing but only lasts for a few seconds. He has had this for a long time but clearly the more recent attack was much more severe. He reports the dizziness mostly as a lightheaded sensation as opposed to spinning. He was told by his family that he did have some slurring of the speech. He reports having weakness of the upper and lower extremities and what appears to be evidence of dysmetria. The patient reports that he may have had some blurring of his vision briefly. The spell lasted for about 2 hours. He has improved dramatically although it's unclear if he is back to normal completely. He does not report other symptoms such as headache,  dyspnea, chest pain or diarrhea. He did have 2 episodes of vomiting during the spell. The patient was not considered for TPA given that he is on anticoagulation.  GENERAL: This a pleasant moderately overweight man in no acute distress.  HEENT: Supple. Atraumatic normocephalic.   ABDOMEN: soft  EXTREMITIES: No edema   BACK: Normal.  SKIN: Normal by inspection.    MENTAL STATUS: Alert and oriented. Speech, language and cognition are generally intact. Judgment and insight normal.   CRANIAL NERVES: Pupils are equal, round and reactive to light and accommodation; extra ocular movements are full, there is no significant nystagmus; visual fields are full; upper and lower facial muscles are normal in strength and symmetric, there is no flattening of the nasolabial folds; tongue is midline; uvula is midline; shoulder elevation is normal.  MOTOR: Normal tone, bulk and strength; no pronator drift.  COORDINATION: Left finger to nose is normal, right finger to nose is normal, No rest tremor; no intention tremor; no postural tremor; no bradykinesia.  REFLEXES: Deep tendon reflexes are symmetrical But diminished in the legs. There are normal in the upper extremities. Babinski reflexes are flexor bilaterally.   SENSATION: Normal to light touch.  GAIT: The patient still with limited assistance. He did have good strides and mostly seemed to have a steady gait stance been somewhat wide.   Past Medical History  Diagnosis Date  . Essential hypertension, benign   . Type 2 diabetes mellitus   . Coronary atherosclerosis of native coronary artery     BMS to LAD 2001 at [facility], PTCA/atherectomy ramus and  BMS to LAD 2009  . S/P colonoscopy     Normal via ostomy - September 2009  . Chronic systolic heart failure   . Myocardial infarction, anterior wall     Treated with tPA at [facility] 2000  . GERD (gastroesophageal reflux disease)   . Cardiomyopathy, ischemic     BIV ICD St. Jude, LVEF 23%  . Paroxysmal  atrial fibrillation     Amiodarone and Coumadin  . 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 Prostate cancer   . 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 Transaminitis 12/12/2013  . CHF (congestive heart failure)     Past Surgical History  Procedure Laterality Date  . Internal defibrillator and pacemaker  2010    x2 St. Jude device  . Abdominal and perineal resection of rectum with total mesorectal excision      10/04/2007  . Colonoscopy      05/17/2007. IMPRESSION: Semilunar, apple-core neoplasm low in the rectum (palpable on digital rectal exam) beginning at 5 cm and corkscrewing up 5 cm in length. This was a low rectal lesion consistent with colorectal carcinoma. It was biosied multiple times. The upstream colon all the way to the cecum appeared normal. Recommendations: Followup on path. Surgical Consultation   . Colonoscopy  09/14/2011    Procedure: COLONOSCOPY;  Surgeon: Daneil Dolin, MD;  Location: [facility];  Service: Endoscopy;  Laterality: N/A;  8:30- TCS via colostomy & pt has defibrillator  . Colostomy      Family History  Problem Relation Age of Onset  . Colon cancer Mother 20  . Colon cancer Sister 22  . Coronary artery disease Father     Social History:  reports that he has never smoked. He has never used smokeless tobacco. He reports that he does not drink alcohol or use illicit drugs.  Allergies: No Known Allergies  Medications: Prior to Admission medications   Medication Sig Start Date End Date Taking? Authorizing Provider  amiodarone (PACERONE) 200 MG tablet Take 1 tablet (200 mg total) by mouth 2 (two) times daily. 10/25/13  Yes Kelvin Cellar, MD  aspirin 81 MG EC tablet Take 81 mg by mouth at bedtime.    Yes Historical Provider, MD  citalopram (CELEXA) 20 MG tablet Take 10 mg by mouth daily. 11/22/13  Yes Lorretta Harp, MD  co-enzyme Q-10 50 MG capsule Take 50 mg by mouth every morning.    Yes Historical Provider, MD    digoxin (LANOXIN) 0.125 MG tablet Take 0.125 mg by mouth every other day.    Yes Historical Provider, MD  fluticasone (FLONASE) 50 MCG/ACT nasal spray Place 1 spray into both nostrils daily as needed for allergies.  11/21/13  Yes Historical Provider, MD  furosemide (LASIX) 20 MG tablet Take 20 mg by mouth daily.  12/13/13  Yes Lezlie Octave Black, NP  insulin aspart (NOVOLOG) 100 UNIT/ML injection Inject 12-14 Units into the skin 3 (three) times daily as needed for high blood sugar. 14 units at breakfast and 12 units a lunch and 12 units at supper per sliding scale    Yes Historical Provider, MD  insulin glargine (LANTUS) 100 UNIT/ML injection Inject 24 Units into the skin at bedtime.    Yes Historical Provider, MD  isosorbide mononitrate (IMDUR) 30 MG 24 hr tablet Take 15 mg by mouth 2 (two) times daily.   Yes Historical Provider, MD  metFORMIN (GLUCOPHAGE) 500 MG tablet Take 500 mg by mouth 2 (two)  times daily with a meal.   Yes Historical Provider, MD  metoprolol succinate (TOPROL-XL) 100 MG 24 hr tablet Take 1 tablet (100 mg total) by mouth daily. Take with or immediately following a meal. 12/13/13  Yes Lezlie Octave Black, NP  niacin (NIASPAN) 1000 MG CR tablet Take 1,000 mg by mouth at bedtime.     Yes Historical Provider, MD  Omega-3 Fatty Acids (FISH OIL) 1200 MG CAPS Take 1,200 mg by mouth 2 (two) times daily.     Yes Historical Provider, MD  pantoprazole (PROTONIX) 40 MG tablet Take 1 tablet (40 mg total) by mouth daily. 11/20/13  Yes Lorretta Harp, MD  ranolazine (RANEXA) 1000 MG SR tablet Take 1,000 mg by mouth 2 (two) times daily.   Yes Historical Provider, MD  rosuvastatin (CRESTOR) 40 MG tablet Take 40 mg by mouth at bedtime. 01/18/14  Yes Arnoldo Lenis, MD  spironolactone (ALDACTONE) 25 MG tablet Take 1 tablet (25 mg total) by mouth daily. 12/17/13  Yes Mihai Croitoru, MD  warfarin (COUMADIN) 2.5 MG tablet Take 2.5 mg by mouth daily at 6 PM. 01/10/14  Yes Arnoldo Lenis, MD  nitroGLYCERIN  (NITROLINGUAL) 0.4 MG/SPRAY spray Place 1 spray under the tongue every 5 (five) minutes as needed. angina    Historical Provider, MD    Scheduled Meds: . amiodarone  200 mg Oral BID  . aspirin EC  81 mg Oral QHS  . citalopram  20 mg Oral Daily  . digoxin  0.125 mg Oral QODAY  . insulin aspart  12 Units Subcutaneous Q lunch   And  . insulin aspart  12 Units Subcutaneous Q supper  . insulin aspart  14 Units Subcutaneous Q breakfast  . insulin glargine  24 Units Subcutaneous QHS  . isosorbide mononitrate  15 mg Oral BID  . metoprolol succinate  50 mg Oral Daily  . pantoprazole  40 mg Oral Daily  . ranolazine  1,000 mg Oral BID  . sodium chloride  3 mL Intravenous Q12H  . spironolactone  25 mg Oral Daily  . warfarin  2.5 mg Oral q1800  . Warfarin - Physician Dosing Inpatient   Does not apply q1800   Continuous Infusions:  PRN Meds:.fluticasone, meclizine, ondansetron (ZOFRAN) IV, ondansetron   Blood pressure 96/7, pulse 85, temperature 97.5 F (36.4 C), temperature source Oral, resp. rate 18, height $RemoveBe'5\' 10"'[id number]$  (1.778 m), weight 96.7 kg (213 lb 3 oz), SpO2 96.00%.   Results for orders placed during the hospital encounter of 01/26/14 (from the past 48 hour(s))  CBC WITH DIFFERENTIAL     Status: Abnormal   Collection Time    01/26/14  9:27 PM      Result Value Ref Range   WBC 7.9  4.0 - 10.5 K/uL   RBC 4.57  4.22 - 5.81 MIL/uL   Hemoglobin 12.3 (*) 13.0 - 17.0 g/dL   HCT 38.1 (*) 39.0 - 52.0 %   MCV 83.4  78.0 - 100.0 fL   MCH 26.9  26.0 - 34.0 pg   MCHC 32.3  30.0 - 36.0 g/dL   RDW 15.3  11.5 - 15.5 %   Platelets 167  150 - 400 K/uL   Neutrophils Relative % 78 (*) 43 - 77 %   Neutro Abs 6.2  1.7 - 7.7 K/uL   Lymphocytes Relative 12  12 - 46 %   Lymphs Abs 0.9  0.7 - 4.0 K/uL   Monocytes Relative 8  3 - 12 %  Monocytes Absolute 0.6  0.1 - 1.0 K/uL   Eosinophils Relative 3  0 - 5 %   Eosinophils Absolute 0.2  0.0 - 0.7 K/uL   Basophils Relative 0  0 - 1 %   Basophils  Absolute 0.0  0.0 - 0.1 K/uL  COMPREHENSIVE METABOLIC PANEL     Status: Abnormal   Collection Time    01/26/14  9:27 PM      Result Value Ref Range   Sodium 130 (*) 137 - 147 mEq/L   Potassium 4.1  3.7 - 5.3 mEq/L   Chloride 93 (*) 96 - 112 mEq/L   CO2 22  19 - 32 mEq/L   Glucose, Bld 367 (*) 70 - 99 mg/dL   BUN 30 (*) 6 - 23 mg/dL   Creatinine, Ser 1.66 (*) 0.50 - 1.35 mg/dL   Calcium 9.2  8.4 - 10.5 mg/dL   Total Protein 8.0  6.0 - 8.3 g/dL   Albumin 3.4 (*) 3.5 - 5.2 g/dL   AST 41 (*) 0 - 37 U/L   ALT 39  0 - 53 U/L   Alkaline Phosphatase 101  39 - 117 U/L   Total Bilirubin 0.8  0.3 - 1.2 mg/dL   GFR calc non Af Amer 44 (*) >90 mL/min   GFR calc Af Amer 51 (*) >90 mL/min   Comment: (NOTE)     The eGFR has been calculated using the CKD EPI equation.     This calculation has not been validated in all clinical situations.     eGFR's persistently <90 mL/min signify possible Chronic Kidney     Disease.  TROPONIN I     Status: None   Collection Time    01/26/14  9:27 PM      Result Value Ref Range   Troponin I <0.30  <0.30 ng/mL   Comment:            Due to the release kinetics of cTnI,     a negative result within the first hours     of the onset of symptoms does not rule out     myocardial infarction with certainty.     If myocardial infarction is still suspected,     repeat the test at appropriate intervals.  ETHANOL     Status: None   Collection Time    01/26/14  9:28 PM      Result Value Ref Range   Alcohol, Ethyl (B) <11  0 - 11 mg/dL   Comment:            LOWEST DETECTABLE LIMIT FOR     SERUM ALCOHOL IS 11 mg/dL     FOR MEDICAL PURPOSES ONLY  PROTIME-INR     Status: Abnormal   Collection Time    01/26/14  9:28 PM      Result Value Ref Range   Prothrombin Time 25.3 (*) 11.6 - 15.2 seconds   INR 2.39 (*) 0.00 - 1.49  APTT     Status: None   Collection Time    01/26/14  9:28 PM      Result Value Ref Range   aPTT 36  24 - 37 seconds  PRO B NATRIURETIC PEPTIDE      Status: Abnormal   Collection Time    01/26/14  9:28 PM      Result Value Ref Range   Pro B Natriuretic peptide (BNP) 2348.0 (*) 0 - 125 pg/mL  CBG MONITORING, ED  Status: Abnormal   Collection Time    01/26/14  9:52 PM      Result Value Ref Range   Glucose-Capillary 337 (*) 70 - 99 mg/dL   Comment 1 Documented in Chart     Comment 2 Notify RN    GLUCOSE, CAPILLARY     Status: Abnormal   Collection Time    01/27/14 12:05 AM      Result Value Ref Range   Glucose-Capillary 284 (*) 70 - 99 mg/dL  BASIC METABOLIC PANEL     Status: Abnormal   Collection Time    01/27/14  3:21 AM      Result Value Ref Range   Sodium 132 (*) 137 - 147 mEq/L   Potassium 4.5  3.7 - 5.3 mEq/L   Chloride 96  96 - 112 mEq/L   CO2 25  19 - 32 mEq/L   Glucose, Bld 273 (*) 70 - 99 mg/dL   BUN 28 (*) 6 - 23 mg/dL   Creatinine, Ser 1.51 (*) 0.50 - 1.35 mg/dL   Calcium 8.9  8.4 - 10.5 mg/dL   GFR calc non Af Amer 49 (*) >90 mL/min   GFR calc Af Amer 57 (*) >90 mL/min   Comment: (NOTE)     The eGFR has been calculated using the CKD EPI equation.     This calculation has not been validated in all clinical situations.     eGFR's persistently <90 mL/min signify possible Chronic Kidney     Disease.  CBC     Status: Abnormal   Collection Time    01/27/14  3:21 AM      Result Value Ref Range   WBC 6.5  4.0 - 10.5 K/uL   RBC 4.25  4.22 - 5.81 MIL/uL   Hemoglobin 11.7 (*) 13.0 - 17.0 g/dL   HCT 35.3 (*) 39.0 - 52.0 %   MCV 83.1  78.0 - 100.0 fL   MCH 27.5  26.0 - 34.0 pg   MCHC 33.1  30.0 - 36.0 g/dL   RDW 15.4  11.5 - 15.5 %   Platelets 159  150 - 400 K/uL  PROTIME-INR     Status: Abnormal   Collection Time    01/27/14  3:21 AM      Result Value Ref Range   Prothrombin Time 26.6 (*) 11.6 - 15.2 seconds   INR 2.55 (*) 0.00 - 1.49  TSH     Status: None   Collection Time    01/27/14  3:21 AM      Result Value Ref Range   TSH 1.383  0.350 - 4.500 uIU/mL   Comment: Performed at [facility]  DIGOXIN LEVEL     Status: None   Collection Time    01/27/14  3:21 AM      Result Value Ref Range   Digoxin Level 1.1  0.8 - 2.0 ng/mL  TROPONIN I     Status: None   Collection Time    01/27/14  3:21 AM      Result Value Ref Range   Troponin I <0.30  <0.30 ng/mL   Comment:            Due to the release kinetics of cTnI,     a negative result within the first hours     of the onset of symptoms does not rule out     myocardial infarction with certainty.     If myocardial infarction is still suspected,  repeat the test at appropriate intervals.  TROPONIN I     Status: None   Collection Time    01/27/14  9:46 AM      Result Value Ref Range   Troponin I <0.30  <0.30 ng/mL   Comment:            Due to the release kinetics of cTnI,     a negative result within the first hours     of the onset of symptoms does not rule out     myocardial infarction with certainty.     If myocardial infarction is still suspected,     repeat the test at appropriate intervals.  TROPONIN I     Status: None   Collection Time    01/27/14  3:27 PM      Result Value Ref Range   Troponin I <0.30  <0.30 ng/mL   Comment:            Due to the release kinetics of cTnI,     a negative result within the first hours     of the onset of symptoms does not rule out     myocardial infarction with certainty.     If myocardial infarction is still suspected,     repeat the test at appropriate intervals.  GLUCOSE, CAPILLARY     Status: Abnormal   Collection Time    01/27/14  4:31 PM      Result Value Ref Range   Glucose-Capillary 202 (*) 70 - 99 mg/dL  GLUCOSE, CAPILLARY     Status: Abnormal   Collection Time    01/27/14  8:43 PM      Result Value Ref Range   Glucose-Capillary 150 (*) 70 - 99 mg/dL  PROTIME-INR     Status: Abnormal   Collection Time    01/28/14  5:22 AM      Result Value Ref Range   Prothrombin Time 27.4 (*) 11.6 - 15.2 seconds   INR 2.66 (*) 0.00 - 0.35  BASIC METABOLIC PANEL      Status: Abnormal   Collection Time    01/28/14  5:22 AM      Result Value Ref Range   Sodium 139  137 - 147 mEq/L   Comment: DELTA CHECK NOTED   Potassium 4.2  3.7 - 5.3 mEq/L   Chloride 104  96 - 112 mEq/L   CO2 26  19 - 32 mEq/L   Glucose, Bld 105 (*) 70 - 99 mg/dL   BUN 22  6 - 23 mg/dL   Creatinine, Ser 1.43 (*) 0.50 - 1.35 mg/dL   Calcium 9.0  8.4 - 10.5 mg/dL   GFR calc non Af Amer 52 (*) >90 mL/min   GFR calc Af Amer 60 (*) >90 mL/min   Comment: (NOTE)     The eGFR has been calculated using the CKD EPI equation.     This calculation has not been validated in all clinical situations.     eGFR's persistently <90 mL/min signify possible Chronic Kidney     Disease.    Dg Chest 1 View  01/26/2014   CLINICAL DATA:  Dizziness, weakness and unsteady gait. Systolic heart failure.  EXAM: CHEST - 1 VIEW  COMPARISON:  DG CHEST 1V PORT dated 12/11/2013  FINDINGS: Cardiac silhouette remains moderately enlarged, mediastinal silhouette is nonsuspicious. Biventricular left cardiac defibrillator in situ. Lungs are clear, no pleural effusions or focal consolidations, improved aeration from prior examination. No pneumothorax.  Multiple EKG lines overlie the patient and may obscure subtle underlying pathology. Mild degenerative change of the thoracic spine.  IMPRESSION: Stable cardiomegaly, no acute pulmonary process, improved aeration of the lungs from December 11, 2013.   Electronically Signed   By: Elon Alas   On: 01/26/2014 22:49   Ct Head Wo Contrast  01/26/2014   CLINICAL DATA:  Acute onset dizziness, weakness, and unsteady gait earlier this evening, associated with double vision and nausea/vomiting.  EXAM: CT HEAD WITHOUT CONTRAST  TECHNIQUE: Contiguous axial images were obtained from the base of the skull through the vertex without intravenous contrast.  COMPARISON:  None.  FINDINGS: Ventricular system normal in size and appearance for age. No significant atrophy for age. No mass lesion.  No midline shift. No acute hemorrhage or hematoma. No extra-axial fluid collections. No evidence of acute infarction. No focal brain parenchymal abnormality.  No focal osseous abnormality involving the skull. Visualized paranasal sinuses, bilateral mastoid air cells, and bilateral middle ear cavities well-aerated. 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  IMPRESSION: 1. No acute intracranial abnormality. 2. 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 Non-emergent MRI of the brain and MRA of the intracranial vessels and cervical carotid and vertebral arteries may be helpful if this is a clinical consideration.   Electronically Signed   By: Evangeline Dakin M.D.   On: 01/26/2014 22:23        Keynan Heffern A. Merlene Laughter, M.D.  Diplomate, Tax adviser of Psychiatry and Neurology ( Neurology). 01/28/2014, 9:47 AM

## 2014-01-28 NOTE — Progress Notes (Signed)
Notified Dr. Algis Liming with ortostatic VS via text.  Stated in text that per patient he had no symptoms with change in BP.  Will continue to monitor him.

## 2014-01-28 NOTE — Progress Notes (Signed)
Pharmacist Heart Failure Core Measure Documentation  Assessment: [name] has an EF documented as 30-35% on 08/16/23 by ECHO.  Rationale: Heart failure patients with left ventricular systolic dysfunction (LVSD) and an EF < 40% should be prescribed an angiotensin converting enzyme inhibitor (ACEI) or angiotensin receptor blocker (ARB) at discharge unless a contraindication is documented in the medical record.  This patient is not currently on an ACEI or ARB for HF.  This note is being placed in the record in order to provide documentation that a contraindication to the use of these agents is present for this encounter.  ACE Inhibitor or Angiotensin Receptor Blocker is contraindicated (specify all that apply)  []   ACEI allergy AND ARB allergy []   Angioedema []   Moderate or severe aortic stenosis []   Hyperkalemia [x]   Hypotension []   Renal artery stenosis []   Worsening renal function, preexisting renal disease or dysfunction   Biagio Borg 01/28/2014 12:24 PM

## 2014-01-28 NOTE — Progress Notes (Signed)
Inpatient Diabetes Program Recommendations  AACE/ADA: New Consensus Statement on Inpatient Glycemic Control (2013)  Target Ranges:  Prepandial:   less than 140 mg/dL      Peak postprandial:   less than 180 mg/dL (1-2 hours)      Critically ill patients:  140 - 180 mg/dL   Results for [name], [name] (MRN [id number]) as of 01/28/2014 15:20  Ref. Range 01/27/2014 00:05 01/27/2014 16:31 01/27/2014 20:43 01/28/2014 08:05 01/28/2014 11:50  Glucose-Capillary Latest Range: 70-99 mg/dL 284 (H) 202 (H) 150 (H) 107 (H) 255 (H)   Diabetes history: DM2 Outpatient Diabetes medications: Lantus 24 units QHS, Novolog 14 units with breakfast, Novolog 12 units with lunch, Novolog 12 units with supper, and Metformin 500 mg BID Current orders for Inpatient glycemic control: Lantus 24 units QHS, Novolog 14 units with breakfast, Novolog 12 units with lunch, Novolog 12 units with supper  Inpatient Diabetes Program Recommendations Correction (SSI): While inpatient, please consider ordering Novolog correction scale in addition to Novolog meal coverage.  Thanks, Barnie Alderman, RN, MSN, CCRN Diabetes Coordinator Inpatient Diabetes Program [phone] (Team Pager) [phone] ([facility] office) [phone] [facility] office)

## 2014-01-29 DIAGNOSIS — E1165 Type 2 diabetes mellitus with hyperglycemia: Secondary | ICD-10-CM | POA: Diagnosis not present

## 2014-01-29 DIAGNOSIS — I6340 Cerebral infarction due to embolism of unspecified cerebral artery: Secondary | ICD-10-CM | POA: Diagnosis not present

## 2014-01-29 DIAGNOSIS — I4891 Unspecified atrial fibrillation: Secondary | ICD-10-CM | POA: Diagnosis not present

## 2014-01-29 DIAGNOSIS — R471 Dysarthria and anarthria: Secondary | ICD-10-CM | POA: Diagnosis not present

## 2014-01-29 DIAGNOSIS — R42 Dizziness and giddiness: Secondary | ICD-10-CM | POA: Diagnosis not present

## 2014-01-29 DIAGNOSIS — I959 Hypotension, unspecified: Secondary | ICD-10-CM | POA: Diagnosis not present

## 2014-01-29 DIAGNOSIS — I69993 Ataxia following unspecified cerebrovascular disease: Secondary | ICD-10-CM | POA: Diagnosis not present

## 2014-01-29 DIAGNOSIS — E1129 Type 2 diabetes mellitus with other diabetic kidney complication: Secondary | ICD-10-CM | POA: Diagnosis not present

## 2014-01-29 LAB — PROTIME-INR
INR: 2.62 — ABNORMAL HIGH (ref 0.00–1.49)
Prothrombin Time: 27.1 seconds — ABNORMAL HIGH (ref 11.6–15.2)

## 2014-01-29 LAB — BASIC METABOLIC PANEL
BUN: 22 mg/dL (ref 6–23)
CHLORIDE: 103 meq/L (ref 96–112)
CO2: 25 meq/L (ref 19–32)
CREATININE: 1.42 mg/dL — AB (ref 0.50–1.35)
Calcium: 9.2 mg/dL (ref 8.4–10.5)
GFR calc Af Amer: 61 mL/min — ABNORMAL LOW (ref >90)
GFR calc non Af Amer: 53 mL/min — ABNORMAL LOW (ref >90)
GLUCOSE: 111 mg/dL — AB (ref 70–99)
Potassium: 4.5 mEq/L (ref 3.7–5.3)
Sodium: 139 mEq/L (ref 137–147)

## 2014-01-29 LAB — GLUCOSE, CAPILLARY
GLUCOSE-CAPILLARY: 120 mg/dL — AB (ref 70–99)
Glucose-Capillary: 157 mg/dL — ABNORMAL HIGH (ref 70–99)

## 2014-01-29 MED ORDER — METOPROLOL SUCCINATE ER 50 MG PO TB24: 50.0000 mg | ORAL_TABLET | Freq: Every day | ORAL | Status: DC

## 2014-01-29 NOTE — Progress Notes (Signed)
Inpatient Diabetes Program Recommendations  AACE/ADA: New Consensus Statement on Inpatient Glycemic Control (2013)  Target Ranges:  Prepandial:   less than 140 mg/dL      Peak postprandial:   less than 180 mg/dL (1-2 hours)      Critically ill patients:  140 - 180 mg/dL   Results for [name], [name] (MRN [id number]) as of 01/29/2014 07:55  Ref. Range 01/28/2014 08:05 01/28/2014 11:50 01/28/2014 16:37 01/28/2014 22:22  Glucose-Capillary Latest Range: 70-99 mg/dL 107 (H) 255 (H) 135 (H) 107 (H)   Results for [name], [name] (MRN [id number]) as of 01/29/2014 07:55  Ref. Range 01/29/2014 04:59  Glucose Latest Range: 70-99 mg/dL 111 (H)   Diabetes history: DM2  Outpatient Diabetes medications: Lantus 24 units QHS, Novolog 14 units with breakfast, Novolog 12 units with lunch, Novolog 12 units with supper, and Metformin 500 mg BID  Current orders for Inpatient glycemic control: Lantus 24 units QHS, Novolog 14 units with breakfast, Novolog 12 units with lunch, Novolog 12 units with supper  Inpatient Diabetes Program Recommendations Correction (SSI): While inpatient, please consider ordering Novolog correction scale in addition to Novolog meal coverage. Insulin-Basal: Noted patient refused Lantus last night due to bedtime glucose of 107 mg/dl and patient felt his blood sugar would drop to low if basal insulin was given.    Note: Called patient to discuss refusal of Lantus insulin last night.  Patient reports that he felt his blood sugar would drop to low if he took the Lantus dose last night since his blood sugar was 107 mg/dl.  Inquired about skipping Lantus at home.  Patient reports that if his blood sugar is in the low 100's mg/dl at bedtime he does not take his Lantus that night.  However, according to the patient skipping Lantus is a rare occasion for him.  When asked for more information on how often he skips the Lantus dose he states "it is hard to say but it does not happen very often, maybe a couple  times a month".  Since Lantus was not taken last night, anticipate blood glucose to be higher throughout the day.  While inpatient, please order Novolog correction scale in addition to scheduled dosing of Novolog meal coverage with each meal.    Thanks, Barnie Alderman, RN, MSN, [place] Diabetes Coordinator Inpatient Diabetes Program [phone] (Team Pager) [phone] ([facility] office) [phone] [facility] office)

## 2014-01-29 NOTE — Progress Notes (Signed)
Patient ID: [name], male   DOB: 1956/03/04, 58 y.o.   MRN: [id number]   [facility] NEUROLOGY Deepika [place] A. Gerilyn Pilgrim, MD     [email or web address]          [name] is an 58 y.o. male.   Assessment/Plan: 1. Acute onset of gait instability/ataxia, dysarthria and weakness of the upper and lower extremities along with some visual problems. The picture is concerning for posterior circulation ischemic event especially given his comorbidities which includes hypertension, Atrial fibrillation, uncontrolled diabetes and coronary disease. This is recommended for up to 1 month. He should continue with risk factor modification including controlling hypertension, diabetes, use of aspirin and statin. It is also reasonable for the patient being evaluated for obstructive sleep apnea syndrome if this has not been done. Is also a possibility that the patient's symptoms could be due to hypotension. Some of his antihypertensive have been reduced/discontinued. The patient is okay for discharge from my standpoint today.  The patient reports having a little dizziness on standing and moving around yesterday. He reports a spinning like sensation. This seems to be his baseline in talking to the patient. He again has had long-standing history of episodic spinning sensation on standing which usually lasts briefly. We have encouraged him to move around this morning in anticipation of the patient being discharged.    GENERAL: This a pleasant moderately overweight man in no acute distress.  HEENT: Supple. Atraumatic normocephalic.  EXTREMITIES: No edema  BACK: Normal.  SKIN: Normal by inspection.  MENTAL STATUS: Alert and oriented. Speech, language and cognition are generally intact. Judgment and insight normal.  CRANIAL NERVES: Pupils are equal, round and reactive to light and accommodation; extra ocular movements are full, there is no significant nystagmus; visual fields are full; upper and lower facial muscles  are normal in strength and symmetric, there is no flattening of the nasolabial folds. MOTOR: Normal tone, bulk and strength; no pronator drift.  COORDINATION: Left finger to nose is normal, right finger to nose is normal, No rest tremor; no intention tremor; no postural tremor; no bradykinesia.     Objective: Vital signs in last 24 hours: Temp:  [97.7 F (36.5 C)-97.8 F (36.6 C)] 97.8 F (36.6 C) (02/23 2140) Pulse Rate:  [69-81] 69 (02/23 2140) Resp:  [20] 20 (02/23 2140) BP: (82-107)/(62-68) 107/65 mmHg (02/23 2140) SpO2:  [98 %-100 %] 100 % (02/23 2140)  Intake/Output from previous day: 02/23 0701 - 02/24 0700 In: 360 [P.O.:360] Out: -  Intake/Output this shift: Total I/O In: -  Out: 225 [Urine:225] Nutritional status:     Lab Results: Results for orders placed during the hospital encounter of 01/26/14 (from the past 48 hour(s))  TROPONIN I     Status: None   Collection Time    01/27/14  9:46 AM      Result Value Ref Range   Troponin I <0.30  <0.30 ng/mL   Comment:            Due to the release kinetics of cTnI,     a negative result within the first hours     of the onset of symptoms does not rule out     myocardial infarction with certainty.     If myocardial infarction is still suspected,     repeat the test at appropriate intervals.  TROPONIN I     Status: None   Collection Time    01/27/14  3:27 PM      Result Value Ref  Range   Troponin I <0.30  <0.30 ng/mL   Comment:            Due to the release kinetics of cTnI,     a negative result within the first hours     of the onset of symptoms does not rule out     myocardial infarction with certainty.     If myocardial infarction is still suspected,     repeat the test at appropriate intervals.  GLUCOSE, CAPILLARY     Status: Abnormal   Collection Time    01/27/14  4:31 PM      Result Value Ref Range   Glucose-Capillary 202 (*) 70 - 99 mg/dL  GLUCOSE, CAPILLARY     Status: Abnormal   Collection Time     01/27/14  8:43 PM      Result Value Ref Range   Glucose-Capillary 150 (*) 70 - 99 mg/dL  PROTIME-INR     Status: Abnormal   Collection Time    01/28/14  5:22 AM      Result Value Ref Range   Prothrombin Time 27.4 (*) 11.6 - 15.2 seconds   INR 2.66 (*) 0.00 - 1.49  BASIC METABOLIC PANEL     Status: Abnormal   Collection Time    01/28/14  5:22 AM      Result Value Ref Range   Sodium 139  137 - 147 mEq/L   Comment: DELTA CHECK NOTED   Potassium 4.2  3.7 - 5.3 mEq/L   Chloride 104  96 - 112 mEq/L   CO2 26  19 - 32 mEq/L   Glucose, Bld 105 (*) 70 - 99 mg/dL   BUN 22  6 - 23 mg/dL   Creatinine, Ser 3.66 (*) 0.50 - 1.35 mg/dL   Calcium 9.0  8.4 - 44.0 mg/dL   GFR calc non Af Amer 52 (*) >90 mL/min   GFR calc Af Amer 60 (*) >90 mL/min   Comment: (NOTE)     The eGFR has been calculated using the CKD EPI equation.     This calculation has not been validated in all clinical situations.     eGFR's persistently <90 mL/min signify possible Chronic Kidney     Disease.  GLUCOSE, CAPILLARY     Status: Abnormal   Collection Time    01/28/14  8:05 AM      Result Value Ref Range   Glucose-Capillary 107 (*) 70 - 99 mg/dL   Comment 1 Notify RN    GLUCOSE, CAPILLARY     Status: Abnormal   Collection Time    01/28/14 11:50 AM      Result Value Ref Range   Glucose-Capillary 255 (*) 70 - 99 mg/dL   Comment 1 Notify RN    URINE RAPID DRUG SCREEN (HOSP PERFORMED)     Status: None   Collection Time    01/28/14  2:08 PM      Result Value Ref Range   Opiates NONE DETECTED  NONE DETECTED   Cocaine NONE DETECTED  NONE DETECTED   Benzodiazepines NONE DETECTED  NONE DETECTED   Amphetamines NONE DETECTED  NONE DETECTED   Tetrahydrocannabinol NONE DETECTED  NONE DETECTED   Barbiturates NONE DETECTED  NONE DETECTED   Comment:            DRUG SCREEN FOR MEDICAL PURPOSES     ONLY.  IF CONFIRMATION IS NEEDED     FOR ANY PURPOSE, NOTIFY LAB     WITHIN  5 DAYS.                LOWEST DETECTABLE LIMITS      FOR URINE DRUG SCREEN     Drug Class       Cutoff (ng/mL)     Amphetamine      1000     Barbiturate      200     Benzodiazepine   200     Tricyclics       300     Opiates          300     Cocaine          300     THC              50  URINALYSIS, ROUTINE W REFLEX MICROSCOPIC     Status: Abnormal   Collection Time    01/28/14  2:08 PM      Result Value Ref Range   Color, Urine YELLOW  YELLOW   APPearance CLEAR  CLEAR   Specific Gravity, Urine 1.025  1.005 - 1.030   pH 6.0  5.0 - 8.0   Glucose, UA 500 (*) NEGATIVE mg/dL   Hgb urine dipstick NEGATIVE  NEGATIVE   Bilirubin Urine NEGATIVE  NEGATIVE   Ketones, ur NEGATIVE  NEGATIVE mg/dL   Protein, ur NEGATIVE  NEGATIVE mg/dL   Urobilinogen, UA 1.0  0.0 - 1.0 mg/dL   Nitrite NEGATIVE  NEGATIVE   Leukocytes, UA NEGATIVE  NEGATIVE   Comment: MICROSCOPIC NOT DONE ON URINES WITH NEGATIVE PROTEIN, BLOOD, LEUKOCYTES, NITRITE, OR GLUCOSE <1000 mg/dL.  GLUCOSE, CAPILLARY     Status: Abnormal   Collection Time    01/28/14  4:37 PM      Result Value Ref Range   Glucose-Capillary 135 (*) 70 - 99 mg/dL   Comment 1 Notify RN     Comment 2 Documented in Chart    GLUCOSE, CAPILLARY     Status: Abnormal   Collection Time    01/28/14 10:22 PM      Result Value Ref Range   Glucose-Capillary 107 (*) 70 - 99 mg/dL   Comment 1 Notify RN     Comment 2 Documented in Chart    PROTIME-INR     Status: Abnormal   Collection Time    01/29/14  4:59 AM      Result Value Ref Range   Prothrombin Time 27.1 (*) 11.6 - 15.2 seconds   INR 2.62 (*) 0.00 - 1.49  BASIC METABOLIC PANEL     Status: Abnormal   Collection Time    01/29/14  4:59 AM      Result Value Ref Range   Sodium 139  137 - 147 mEq/L   Potassium 4.5  3.7 - 5.3 mEq/L   Chloride 103  96 - 112 mEq/L   CO2 25  19 - 32 mEq/L   Glucose, Bld 111 (*) 70 - 99 mg/dL   BUN 22  6 - 23 mg/dL   Creatinine, Ser 2.44 (*) 0.50 - 1.35 mg/dL   Calcium 9.2  8.4 - 01.0 mg/dL   GFR calc non Af Amer 53 (*)  >90 mL/min   GFR calc Af Amer 61 (*) >90 mL/min   Comment: (NOTE)     The eGFR has been calculated using the CKD EPI equation.     This calculation has not been validated in all clinical situations.     eGFR's persistently <90 mL/min signify possible  Chronic Kidney     Disease.  GLUCOSE, CAPILLARY     Status: Abnormal   Collection Time    01/29/14  8:18 AM      Result Value Ref Range   Glucose-Capillary 120 (*) 70 - 99 mg/dL   Comment 1 Notify RN      Lipid Panel No results found for this basename: CHOL, TRIG, HDL, CHOLHDL, VLDL, LDLCALC,  in the last 72 hours  Studies/Results: Ct Head Wo Contrast  01/28/2014   CLINICAL DATA:  Followup for ataxia.  History of colon carcinoma.  EXAM: CT HEAD WITHOUT CONTRAST  TECHNIQUE: Contiguous axial images were obtained from the base of the skull through the vertex without intravenous contrast.  COMPARISON:  01/26/2014  FINDINGS: Ventricles are normal size for this patient's age and normal in configuration.  No parenchymal masses or mass effect. There are no areas of abnormal parenchymal attenuation. No evidence of a cortical infarct.  There are no extra-axial masses or abnormal fluid collections. Skullbase vascular calcifications described previously are stable.  No intracranial hemorrhage.  Visualized sinuses, mastoid air cells and middle ear cavities are clear.  IMPRESSION: 1. No acute intracranial abnormalities. No change from the prior study.   Electronically Signed   By: Amie Portland M.D.   On: 01/28/2014 14:43   US Carotid Duplex Bilateral  01/28/2014   CLINICAL DATA:  Dizziness, coronary artery disease, diabetes.  EXAM: BILATERAL CAROTID DUPLEX ULTRASOUND  TECHNIQUE: Wallace Cullens scale imaging, color Doppler and duplex ultrasound was performed of bilateral carotid and vertebral arteries in the neck.  COMPARISON:  None.  REVIEW OF SYSTEMS: Quantification of carotid stenosis is based on velocity parameters that correlate the residual internal carotid  diameter with NASCET-based stenosis levels, using the diameter of the distal internal carotid lumen as the denominator for stenosis measurement.  The following velocity measurements were obtained:  PEAK SYSTOLIC/END DIASTOLIC  RIGHT  ICA:                     105/28cm/sec  CCA:                     75/20cm/sec  SYSTOLIC ICA/CCA RATIO:  1.39  DIASTOLIC ICA/CCA RATIO: 1.37  ECA:                     61cm/sec  LEFT  ICA:                     81/26cm/sec  CCA:                     80/21cm/sec  SYSTOLIC ICA/CCA RATIO:  1.01  DIASTOLIC ICA/CCA RATIO: 1.22  ECA:                     82cm/sec  FINDINGS: RIGHT CAROTID ARTERY: Smooth on calcified plaque effaces the carotid bulb and extends into the proximal ICA resulting in at least mild stenosis. Normal waveforms and color Doppler signal.  RIGHT VERTEBRAL ARTERY:  Normal flow direction and waveform.  LEFT CAROTID ARTERY: Circumferential partially calcified plaque in the proximal ICA resulting in at least mild stenosis. Normal waveforms and color Doppler signal.  LEFT VERTEBRAL ARTERY: Normal flow direction and waveform.  IMPRESSION: 1. 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 The exam does not exclude plaque ulceration or embolization. Continued surveillance recommended.   Electronically Signed   By: Kerry Kass.D.  On: 01/28/2014 13:52    Medications:  Scheduled Meds: . amiodarone  200 mg Oral BID  . aspirin EC  81 mg Oral QHS  . citalopram  20 mg Oral Daily  . digoxin  0.125 mg Oral QODAY  . insulin aspart  12 Units Subcutaneous Q lunch   And  . insulin aspart  12 Units Subcutaneous Q supper  . insulin aspart  14 Units Subcutaneous Q breakfast  . insulin glargine  24 Units Subcutaneous QHS  . isosorbide mononitrate  15 mg Oral BID  . metoprolol succinate  50 mg Oral Daily  . pantoprazole  40 mg Oral Daily  . ranolazine  1,000 mg Oral BID  . sodium chloride  3 mL Intravenous Q12H  . spironolactone  25 mg Oral Daily  .  warfarin  2.5 mg Oral q1800  . Warfarin - Physician Dosing Inpatient   Does not apply q1800   Continuous Infusions:  PRN Meds:.fluticasone, meclizine, ondansetron (ZOFRAN) IV, ondansetron     LOS: 3 days   Cashlynn Yearwood A. Gerilyn Pilgrim, M.D.  Diplomate, Biomedical engineer of Psychiatry and Neurology ( Neurology).

## 2014-01-29 NOTE — Progress Notes (Signed)
Pt ambulated in hallway, independently. Pt ambulated approximately 200 feet with no complaints. Tolerated well.

## 2014-01-29 NOTE — Discharge Summary (Signed)
Physician Discharge Summary  [name] QJJ:[id number] DOB: 01-13-1956 DOA: 01/26/2014  PCP: Glo Herring., MD  Admit date: 01/26/2014 Discharge date: 01/29/2014  Time spent: Less than 30 minutes  Recommendations for Outpatient Follow-up:  1. Dr. Redmond School, PCP in 1 week-to be seen with repeat labs (CBC & BMP). 2. Consider outpatient evaluation for OSA.  Discharge Diagnoses:  Principal Problem:   Dizziness Active Problems:   CARDIOMYOPATHY, ISCHEMIC, with BiV ICD, st Jude EF 23%   PAF (paroxysmal atrial fibrillation)   Long term (current) use of anticoagulants   Chronic systolic heart failure   Obesity   Acute-on-chronic renal failure   DM type 2, uncontrolled, with renal complications   Discharge Condition: Improved & Stable  Diet recommendation: Diabetic and heart healthy diet.  Filed Weights   01/27/14 0500 01/28/14 0604 01/29/14 0911  Weight: 97 kg (213 lb 13.5 oz) 96.7 kg (213 lb 3 oz) 96.2 kg (212 lb 1.3 oz)    History of present illness:  58 year old male with history of hypertension, type II DM with renal complications, CAD status post stent, chronic systolic CHF, ischemic cardiomyopathy, dual ICD, PAF on amiodarone and Coumadin, presented to the ED on 01/26/14 with complaints of acute on chronic dizziness. He states that he has been having dizziness for approximately 6 months which happened only in the upright position-many times when he suddenly stands up to run behind his grandchildren. He denies vertigo. Yesterday the symptoms seemed to get worse with associated nausea, vomiting and he nearly passed out. In the ED CT head was negative for acute findings.   Hospital Course:   1. Dizziness, acute on chronic/concern for posterior circulation ischemic event: He underwent CT head x2 without acute findings. CT head showed severe bilateral carotid siphon and vertebral artery arthrosclerosis-VBI a possibility. Unable to perform MRI brain and neck secondary to  AICD. Unable to perform CTA head and neck secondary to renal insufficiency. Carotid Dopplers show less than 50% bilateral ICA stenosis. His blood pressures were soft and after discussing with cardiology, Toprol-XL was reduced from 100 mg to 50 mg daily with improvement in his blood pressures and symptoms. As per cardiology, patient's device was interrogated in the last 6 weeks and was OK. Neurology was consulted-please refer to consult note for details. In summary they state that, patient had acute onset of gait instability/ataxia, dysarthria and weakness of upper and lower extremities along with some visual problems and that this picture is concerning for posterior circulation ischemic event especially given his comorbidities which includes hypertension, PAF, uncontrolled diabetes and CAD. Neurology recommended risk factor modification including controlling hypertension, diabetes, use of aspirin and statins. They recommended evaluation for OSA if not already done. Neurology has cleared patient for discharge. 2. Chronic systolic CHF/ischemic cardiomyopathy/CAD status post stent/dual ICD/PAF: LVEF 30-35% in September 2014. Clinically compensated. Telemetry with paced rhythm. Continue amiodarone, aspirin, digoxin, nitrates, Ranexa and Coumadin per pharmacy. Anticoagulated. Discussed with Dr. Bronson Ing, Cardiology who reviewed chart and indicated that patient's device was interrogated in the last 6 weeks and was OK.  3. Uncontrolled type II DM with renal complications: Continue home insulins. Continue metformin for now but if his renal functions continued to decline, this will have to be discontinued. Hemoglobin A1c on 12/11/13 was 10.3 suggesting poor OP control and will need further adjustment as OP. 4. Anemia: Likely chronic kidney disease. Stable. 5. Stage III chronic kidney disease: Baseline creatinine is probably in the 1.4-1.6 range. 6. Hypotension: Management as above. Patient advised regarding  precautionary maneuvers for management of orthostatic hypotension.? 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   Consultations:  Neurology  Procedures:  None    Discharge Exam:  Complaints:  Patient denies any further dizziness or lightheadedness, even with ambulation.  Filed Vitals:   01/28/14 1117 01/28/14 1526 01/28/14 2140 01/29/14 0911  BP: 82/62 97/62 107/65   Pulse:  81 69   Temp:  97.7 F (36.5 C) 97.8 F (36.6 C)   TempSrc:  Oral Oral   Resp:  20 20   Height:      Weight:    96.2 kg (212 lb 1.3 oz)  SpO2:  98% 100%     General exam: Pleasant middle-aged male lying comfortably in bed.  Respiratory system: Clear. No increased work of breathing.  Cardiovascular system: S1 & S2 heard, RRR. No JVD, murmurs, gallops, clicks or pedal edema. Telemetry: A. fib/Paced rhythm.  Gastrointestinal system: Abdomen is nondistended, soft and nontender. Normal bowel sounds heard.  Central nervous system: Alert and oriented. No focal neurological deficits.  Extremities: Symmetric 5 x 5 power.  Discharge Instructions      Discharge Orders   Future Appointments Provider Department Dept Phone   02/11/2014 10:45 AM Evans Lance, MD [facility] [phone]   02/12/2014 8:20 AM Arnoldo Lenis, MD [facility] [phone]   Future Orders Complete By Expires   (HEART FAILURE PATIENTS) Call MD:  Anytime you have any of the following symptoms: 1) 3 pound weight gain in 24 hours or 5 pounds in 1 week 2) shortness of breath, with or without a dry hacking cough 3) swelling in the hands, feet or stomach 4) if you have to sleep on extra pillows at night in order to breathe.  As directed    Call MD for:  difficulty breathing, headache or visual disturbances  As directed    Call MD for:  persistant dizziness or light-headedness  As directed    Call MD for:  severe uncontrolled pain  As directed    Diet - low sodium heart healthy  As directed    Diet  Carb Modified  As directed    Increase activity slowly  As directed        Medication List         amiodarone 200 MG tablet  Commonly known as:  PACERONE  Take 1 tablet (200 mg total) by mouth 2 (two) times daily.     aspirin 81 MG EC tablet  Take 81 mg by mouth at bedtime.     citalopram 20 MG tablet  Commonly known as:  CELEXA  Take 10 mg by mouth daily.     co-enzyme Q-10 50 MG capsule  Take 50 mg by mouth every morning.     digoxin 0.125 MG tablet  Commonly known as:  LANOXIN  Take 0.125 mg by mouth every other day.     Fish Oil 1200 MG Caps  Take 1,200 mg by mouth 2 (two) times daily.     fluticasone 50 MCG/ACT nasal spray  Commonly known as:  FLONASE  Place 1 spray into both nostrils daily as needed for allergies.     furosemide 20 MG tablet  Commonly known as:  LASIX  Take 20 mg by mouth daily.     insulin aspart 100 UNIT/ML injection  Commonly known as:  novoLOG  - Inject 12-14 Units into the skin 3 (three) times daily as needed for high blood sugar. 14 units at breakfast and  12 units a lunch and 12 units at supper per sliding scale  -      insulin glargine 100 UNIT/ML injection  Commonly known as:  LANTUS  Inject 24 Units into the skin at bedtime.     isosorbide mononitrate 30 MG 24 hr tablet  Commonly known as:  IMDUR  Take 15 mg by mouth 2 (two) times daily.     metFORMIN 500 MG tablet  Commonly known as:  GLUCOPHAGE  Take 500 mg by mouth 2 (two) times daily with a meal.     metoprolol succinate 50 MG 24 hr tablet  Commonly known as:  TOPROL-XL  Take 1 tablet (50 mg total) by mouth daily. Take with or immediately following a meal.     niacin 1000 MG CR tablet  Commonly known as:  NIASPAN  Take 1,000 mg by mouth at bedtime.     nitroGLYCERIN 0.4 MG/SPRAY spray  Commonly known as:  NITROLINGUAL  Place 1 spray under the tongue every 5 (five) minutes as needed. angina     pantoprazole 40 MG tablet  Commonly known as:  PROTONIX  Take 1 tablet  (40 mg total) by mouth daily.     ranolazine 1000 MG SR tablet  Commonly known as:  RANEXA  Take 1,000 mg by mouth 2 (two) times daily.     rosuvastatin 40 MG tablet  Commonly known as:  CRESTOR  Take 40 mg by mouth at bedtime.     spironolactone 25 MG tablet  Commonly known as:  ALDACTONE  Take 1 tablet (25 mg total) by mouth daily.     warfarin 2.5 MG tablet  Commonly known as:  COUMADIN  Take 2.5 mg by mouth daily at 6 PM.          The results of significant diagnostics from this hospitalization (including imaging, microbiology, ancillary and laboratory) are listed below for reference.    Significant Diagnostic Studies: Dg Chest 1 View  01/26/2014   CLINICAL DATA:  Dizziness, weakness and unsteady gait. Systolic heart failure.  EXAM: CHEST - 1 VIEW  COMPARISON:  DG CHEST 1V PORT dated 12/11/2013  FINDINGS: Cardiac silhouette remains moderately enlarged, mediastinal silhouette is nonsuspicious. Biventricular left cardiac defibrillator in situ. Lungs are clear, no pleural effusions or focal consolidations, improved aeration from prior examination. No pneumothorax.  Multiple EKG lines overlie the patient and may obscure subtle underlying pathology. Mild degenerative change of the thoracic spine.  IMPRESSION: Stable cardiomegaly, no acute pulmonary process, improved aeration of the lungs from December 11, 2013.   Electronically Signed   By: Elon Alas   On: 01/26/2014 22:49   Ct Head Wo Contrast  01/28/2014   CLINICAL DATA:  Followup for ataxia.  History of colon carcinoma.  EXAM: CT HEAD WITHOUT CONTRAST  TECHNIQUE: Contiguous axial images were obtained from the base of the skull through the vertex without intravenous contrast.  COMPARISON:  01/26/2014  FINDINGS: Ventricles are normal size for this patient's age and normal in configuration.  No parenchymal masses or mass effect. There are no areas of abnormal parenchymal attenuation. No evidence of a cortical infarct.  There are no  extra-axial masses or abnormal fluid collections. Skullbase vascular calcifications described previously are stable.  No intracranial hemorrhage.  Visualized sinuses, mastoid air cells and middle ear cavities are clear.  IMPRESSION: 1. No acute intracranial abnormalities. No change from the prior study.   Electronically Signed   By: Lajean Manes M.D.   On: 01/28/2014 14:43  Ct Head Wo Contrast  01/26/2014   CLINICAL DATA:  Acute onset dizziness, weakness, and unsteady gait earlier this evening, associated with double vision and nausea/vomiting.  EXAM: CT HEAD WITHOUT CONTRAST  TECHNIQUE: Contiguous axial images were obtained from the base of the skull through the vertex without intravenous contrast.  COMPARISON:  None.  FINDINGS: Ventricular system normal in size and appearance for age. No significant atrophy for age. No mass lesion. No midline shift. No acute hemorrhage or hematoma. No extra-axial fluid collections. No evidence of acute infarction. No focal brain parenchymal abnormality.  No focal osseous abnormality involving the skull. Visualized paranasal sinuses, bilateral mastoid air cells, and bilateral middle ear cavities well-aerated. 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  IMPRESSION: 1. No acute intracranial abnormality. 2. 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 Non-emergent MRI of the brain and MRA of the intracranial vessels and cervical carotid and vertebral arteries may be helpful if this is a clinical consideration.   Electronically Signed   By: Evangeline Dakin M.D.   On: 01/26/2014 22:23   US Carotid Duplex Bilateral  01/28/2014   CLINICAL DATA:  Dizziness, coronary artery disease, diabetes.  EXAM: BILATERAL CAROTID DUPLEX ULTRASOUND  TECHNIQUE: Pearline Cables scale imaging, color Doppler and duplex ultrasound was  performed of bilateral carotid and vertebral arteries in the neck.  COMPARISON:  None.  REVIEW OF SYSTEMS: Quantification of carotid stenosis is based on velocity parameters that correlate the residual internal carotid diameter with NASCET-based stenosis levels, using the diameter of the distal internal carotid lumen as the denominator for stenosis measurement.  The following velocity measurements were obtained:  PEAK SYSTOLIC/END DIASTOLIC  RIGHT  ICA:                     105/28cm/sec  CCA:                     123456  SYSTOLIC ICA/CCA RATIO:  0000000  DIASTOLIC ICA/CCA RATIO: 123456  ECA:                     61cm/sec  LEFT  ICA:                     81/26cm/sec  CCA:                     [id number]  SYSTOLIC ICA/CCA RATIO:  [id number]  DIASTOLIC ICA/CCA RATIO: [id number]  ECA:                     82cm/sec  FINDINGS: RIGHT CAROTID ARTERY: Smooth on calcified plaque effaces the carotid bulb and extends into the proximal ICA resulting in at least mild stenosis. Normal waveforms and color Doppler signal.  RIGHT VERTEBRAL ARTERY:  Normal flow direction and waveform.  LEFT CAROTID ARTERY: Circumferential partially calcified plaque in the proximal ICA resulting in at least mild stenosis. Normal waveforms and color Doppler signal.  LEFT VERTEBRAL ARTERY: Normal flow direction and waveform.  IMPRESSION: 1. 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 The exam does not exclude plaque ulceration or embolization. Continued surveillance recommended.   Electronically Signed   By: Arne Cleveland M.D.   On: 01/28/2014 13:52    Microbiology: No results found for this or any previous visit (from the past 240 hour(s)).  Labs: Basic Metabolic Panel:  Recent Labs Lab 01/26/14 2127 01/27/14 0321 01/28/14 0522 01/29/14 0459  NA 130* 132* 139 139  K 4.1 4.5 4.2 4.5  CL 93* 96 104 103  CO2 22 25 26 25   GLUCOSE 367* 273* 105* 111*  BUN 30* 28* 22 22  CREATININE 1.66* 1.51* 1.43* 1.42*  CALCIUM 9.2  8.9 9.0 9.2   Liver Function Tests:  Recent Labs Lab 01/26/14 2127  AST 41*  ALT 39  ALKPHOS 101  BILITOT 0.8  PROT 8.0  ALBUMIN 3.4*   No results found for this basename: LIPASE, AMYLASE,  in the last 168 hours No results found for this basename: AMMONIA,  in the last 168 hours CBC:  Recent Labs Lab 01/26/14 2127 01/27/14 0321  WBC 7.9 6.5  NEUTROABS 6.2  --   HGB 12.3* 11.7*  HCT 38.1* 35.3*  MCV 83.4 83.1  PLT 167 159   Cardiac Enzymes:  Recent Labs Lab 01/26/14 2127 01/27/14 0321 01/27/14 0946 01/27/14 1527  TROPONINI <0.30 <0.30 <0.30 <0.30   BNP: BNP (last 3 results)  Recent Labs  12/08/13 0747 12/11/13 1140 01/26/14 2128  PROBNP 7155.0* 3985.0* 2348.0*   CBG:  Recent Labs Lab 01/28/14 1150 01/28/14 1637 01/28/14 2222 01/29/14 0818 01/29/14 1149  GLUCAP 255* 135* 107* 120* 157*    Additional labs:  1. Digoxin level: 1.1 2. Blood alcohol level <11 3. TSH: 1.383   Signed:  Vernell Leep, MD, FACP, FHM. [facility] Pager [phone]  If 7PM-7AM, please contact night-coverage [email or web address] Password [email or web address] 01/29/2014, 1:40 PM

## 2014-01-29 NOTE — Care Management Note (Signed)
    Page 1 of 1   01/29/2014     10:55:14 AM   CARE MANAGEMENT NOTE 01/29/2014  Patient:  [name], [name]   Account Number:  [id number]  Date Initiated:  01/29/2014  Documentation initiated by:  Theophilus Kinds  Subjective/Objective Assessment:   Pt admitted from home with vertigo. Pt lives with his wife and will return home at discharge. Pt is independent with ADL's.     Action/Plan:   No CM needs noted.   Anticipated DC Date:  01/29/2014   Anticipated DC Plan:  [place]  CM consult      Choice offered to / List presented to:             Status of service:  Completed, signed off Medicare Important Message given?   (If response is "NO", the following Medicare IM given date fields will be blank) Date Medicare IM given:   Date Additional Medicare IM given:    Discharge Disposition:  HOME/SELF CARE  Per UR Regulation:    If discussed at Long Length of Stay Meetings, dates discussed:    Comments:  01/29/14 [place], RN BSN CM

## 2014-01-29 NOTE — Progress Notes (Signed)
Pt discharged home today per Dr. Algis Liming. Pt's IV site D/C'd and WNL. Pt's VSS. Pt provided with home medication list, discharge instructions, and made aware of where to pick up prescriptions already called in.  Verbalized understanding. Pt ambulated off floor with standby assist from RN.

## 2014-02-04 ENCOUNTER — Other Ambulatory Visit: Payer: Self-pay

## 2014-02-04 DIAGNOSIS — R42 Dizziness and giddiness: Secondary | ICD-10-CM | POA: Diagnosis not present

## 2014-02-04 DIAGNOSIS — Z6833 Body mass index (BMI) 33.0-33.9, adult: Secondary | ICD-10-CM | POA: Diagnosis not present

## 2014-02-04 DIAGNOSIS — I2510 Atherosclerotic heart disease of native coronary artery without angina pectoris: Secondary | ICD-10-CM | POA: Diagnosis not present

## 2014-02-04 DIAGNOSIS — I4891 Unspecified atrial fibrillation: Secondary | ICD-10-CM | POA: Diagnosis not present

## 2014-02-04 NOTE — Telephone Encounter (Signed)
Rx denied. Defer to Dr Carlyle Dolly in [place].

## 2014-02-06 ENCOUNTER — Telehealth: Payer: Self-pay | Admitting: *Deleted

## 2014-02-06 NOTE — Telephone Encounter (Signed)
Message from pt's wife reporting he was in the hospital last month and missed his office visit. Requests to reschedule. They were given next available appt in June. Orders entered for NP appt.

## 2014-02-07 ENCOUNTER — Telehealth: Payer: Self-pay | Admitting: Oncology

## 2014-02-07 NOTE — Telephone Encounter (Signed)
lvm for pt regarding to March appt....mailed pt appt sched/avs and letter °

## 2014-02-11 ENCOUNTER — Telehealth: Payer: Self-pay | Admitting: *Deleted

## 2014-02-11 ENCOUNTER — Ambulatory Visit (INDEPENDENT_AMBULATORY_CARE_PROVIDER_SITE_OTHER): Payer: Medicare Other | Admitting: Internal Medicine

## 2014-02-11 ENCOUNTER — Encounter: Payer: Self-pay | Admitting: Internal Medicine

## 2014-02-11 VITALS — BP 97/53 | HR 69 | Ht 70.0 in | Wt 217.0 lb

## 2014-02-11 DIAGNOSIS — I2589 Other forms of chronic ischemic heart disease: Secondary | ICD-10-CM | POA: Diagnosis not present

## 2014-02-11 DIAGNOSIS — I4892 Unspecified atrial flutter: Secondary | ICD-10-CM

## 2014-02-11 DIAGNOSIS — I4891 Unspecified atrial fibrillation: Secondary | ICD-10-CM

## 2014-02-11 DIAGNOSIS — I5023 Acute on chronic systolic (congestive) heart failure: Secondary | ICD-10-CM | POA: Diagnosis not present

## 2014-02-11 DIAGNOSIS — I5022 Chronic systolic (congestive) heart failure: Secondary | ICD-10-CM

## 2014-02-11 MED ORDER — AMIODARONE HCL 200 MG PO TABS: 200.0000 mg | ORAL_TABLET | Freq: Every day | ORAL | Status: DC

## 2014-02-11 NOTE — Progress Notes (Signed)
HPI [name] is referred by Dr. Harl Bowie for ongoing ICD evaluation and management. He is a pleasant 58 yo man, s/p MI, s/p ICD implant in 2002 and a BiV ICD upgrade in 2009. In the interim, he has done well. He has had an episode of nausea and vomiting associated with dizziness. No ventricular arrhythmias have been noted and no ICD shocks. He has been anti-coagulated with coumadin but has had some non-compliance. The patient has not been shocked. He has been on fairly high dose amiodarone. He does not feel palpitations and his CHF is class 2A.  No Known Allergies   Current Outpatient Prescriptions  Medication Sig Dispense Refill  . amiodarone (PACERONE) 200 MG tablet Take 1 tablet (200 mg total) by mouth 2 (two) times daily.  60 tablet  1  . aspirin 81 MG EC tablet Take 81 mg by mouth at bedtime.       . citalopram (CELEXA) 20 MG tablet Take 10 mg by mouth daily.      Marland Kitchen co-enzyme Q-10 50 MG capsule Take 50 mg by mouth every morning.       . digoxin (LANOXIN) 0.125 MG tablet Take 0.125 mg by mouth every other day.       . fluticasone (FLONASE) 50 MCG/ACT nasal spray Place 1 spray into both nostrils daily as needed for allergies.       . furosemide (LASIX) 20 MG tablet Take 20 mg by mouth daily.       . insulin aspart (NOVOLOG) 100 UNIT/ML injection Inject 12-14 Units into the skin 3 (three) times daily as needed for high blood sugar. 14 units at breakfast and 12 units a lunch and 12 units at supper per sliding scale       . insulin glargine (LANTUS) 100 UNIT/ML injection Inject 24 Units into the skin at bedtime.       . isosorbide mononitrate (IMDUR) 30 MG 24 hr tablet Take 15 mg by mouth 2 (two) times daily.      . metFORMIN (GLUCOPHAGE) 500 MG tablet Take 500 mg by mouth 2 (two) times daily with a meal.      . metoprolol succinate (TOPROL-XL) 50 MG 24 hr tablet Take 1 tablet (50 mg total) by mouth daily. Take with or immediately following a meal.  30 tablet  0  . niacin (NIASPAN) 1000  MG CR tablet Take 1,000 mg by mouth at bedtime.        . nitroGLYCERIN (NITROLINGUAL) 0.4 MG/SPRAY spray Place 1 spray under the tongue every 5 (five) minutes as needed. angina      . Omega-3 Fatty Acids (FISH OIL) 1200 MG CAPS Take 1,200 mg by mouth 2 (two) times daily.        . pantoprazole (PROTONIX) 40 MG tablet Take 1 tablet (40 mg total) by mouth daily.  30 tablet  6  . ranolazine (RANEXA) 1000 MG SR tablet Take 1,000 mg by mouth 2 (two) times daily.      . rosuvastatin (CRESTOR) 40 MG tablet Take 40 mg by mouth at bedtime.      Marland Kitchen spironolactone (ALDACTONE) 25 MG tablet Take 1 tablet (25 mg total) by mouth daily.  45 tablet  6  . warfarin (COUMADIN) 2.5 MG tablet Take 2.5 mg by mouth daily at 6 PM.       No current facility-administered medications for this visit.     Past Medical History  Diagnosis Date  . Essential hypertension, benign   .  Type 2 diabetes mellitus   . Coronary atherosclerosis of native coronary artery     [facility]MS [facility] LAD 2001 at [facility], PTCA/atherectomy ramus and BMS to LAD 2009  . S/P colonoscopy     Normal via ostomy - September 2009  . Chronic systolic heart failure   . Myocardial infarction, anterior wall     Treated with tPA at [facility] 2000  . GERD (gastroesophageal reflux disease)   . Cardiomyopathy, ischemic     BIV ICD St. Jude, LVEF 23%  . Paroxysmal atrial fibrillation     Amiodarone and Coumadin  . 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 Prostate cancer   . 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 Transaminitis 12/12/2013  . CHF (congestive heart failure)     ROS:   All systems reviewed and negative except as noted in the HPI.   Past Surgical History  Procedure Laterality Date  . Internal defibrillator and pacemaker  2010    x2 St. Jude device  . Abdominal and perineal resection of rectum with total mesorectal excision      10/04/2007  . Colonoscopy      05/17/2007. IMPRESSION: Semilunar, apple-core neoplasm low in  the rectum (palpable on digital rectal exam) beginning at 5 cm and corkscrewing up 5 cm in length. This was a low rectal lesion consistent with colorectal carcinoma. It was biosied multiple times. The upstream colon all the way to the cecum appeared normal. Recommendations: Followup on path. Surgical Consultation   . Colonoscopy  09/14/2011    Procedure: COLONOSCOPY;  Surgeon: Daneil Dolin, MD;  Location: [facility];  Service: Endoscopy;  Laterality: N/A;  8:30- TCS via colostomy & pt has defibrillator  . Colostomy       Family History  Problem Relation Age of Onset  . Colon cancer Mother 38  . Colon cancer Sister 67  . Coronary artery disease Father      History   Social History  . Marital Status: Married    Spouse Name: N/A    Number of Children: 1  . Years of Education: N/A   Occupational History  .     Social History Main Topics  . Smoking status: Never Smoker   . Smokeless tobacco: Never Used  . Alcohol Use: No     Comment: Former user  . Drug Use: No  . Sexual Activity: No   Other Topics Concern  . Not on file   Social History Narrative  . No narrative on file     BP 97/53  Pulse 69  Ht 5\' 10"  (1.778 m)  Wt 217 lb (98.431 kg)  BMI 31.14 kg/m2  Physical Exam:  Well appearing middle aged man, NAD HEENT: Unremarkable Neck:  No JVD, no thyromegally Back:  No CVA tenderness Lungs:  Clear with no wheezes, rales, or rhonchi HEART:  Regular rate rhythm, no murmurs, no rubs, no clicks Abd:  soft, positive bowel sounds, no organomegally, no rebound, no guarding Ext:  2 plus pulses, no edema, no cyanosis, no clubbing Skin:  No rashes no nodules Neuro:  CN II through XII intact, motor grossly intact   DEVICE  Normal device function.  See PaceArt for details. Underlying rhythm is atrial flutter  Assess/Plan:

## 2014-02-11 NOTE — Patient Instructions (Addendum)
Your physician recommends that you schedule a follow-up appointment in: 3 months with Nevin Bloodgood and 12 months with Dr Knox Saliva will receive a reminder letter two months in advance reminding you to call and schedule your appointment. If you don't receive this letter, please contact our office.   Your physician has recommended you make the following change in your medication:  Decreased Amiodarone to 200 mg once a day.

## 2014-02-11 NOTE — Assessment & Plan Note (Signed)
He is in atrial flutter today. Unclear how much has been flutter vs fib. He has been on amio 400 daily. I have asked the patient to reduce his dose to 200 mg daily. We discussed catheter ablation but he is totally asymptomatic. I have asked him to call if he develops increased sob.

## 2014-02-11 NOTE — Telephone Encounter (Signed)
LMOM to call back

## 2014-02-11 NOTE — Telephone Encounter (Signed)
Pt's wife called stating pt has been throwing up every 2-3 days pt has had sudden stomach trouble, pt will throw up in the mornings and after he eats pt is not having any pain, pt went to the heart doctor today and his heart doctor told him to see Dr. Gala Romney and it may be his gallbladder. I made pt a appointment for 03-06-14 at 2:00. Pt would like to be seen sooner please advise

## 2014-02-11 NOTE — Assessment & Plan Note (Signed)
His heart failure symptoms are well compensated. He will continue his current meds. He will maintain a low sodium diet.

## 2014-02-12 ENCOUNTER — Encounter: Payer: Self-pay | Admitting: Cardiology

## 2014-02-12 ENCOUNTER — Ambulatory Visit (INDEPENDENT_AMBULATORY_CARE_PROVIDER_SITE_OTHER): Payer: Medicare Other | Admitting: Cardiology

## 2014-02-12 VITALS — BP 107/64 | HR 70 | Ht 70.0 in | Wt 216.0 lb

## 2014-02-12 DIAGNOSIS — I5022 Chronic systolic (congestive) heart failure: Secondary | ICD-10-CM | POA: Diagnosis not present

## 2014-02-12 DIAGNOSIS — I4891 Unspecified atrial fibrillation: Secondary | ICD-10-CM

## 2014-02-12 DIAGNOSIS — I2510 Atherosclerotic heart disease of native coronary artery without angina pectoris: Secondary | ICD-10-CM | POA: Diagnosis not present

## 2014-02-12 DIAGNOSIS — I10 Essential (primary) hypertension: Secondary | ICD-10-CM | POA: Diagnosis not present

## 2014-02-12 DIAGNOSIS — I2589 Other forms of chronic ischemic heart disease: Secondary | ICD-10-CM | POA: Diagnosis not present

## 2014-02-12 NOTE — Progress Notes (Signed)
Clinical Summary [name] is a 58 y.o.male seen today for the following medical problems.   1. CAD/ICM  - prior BMS to LAD in 2001, repeat BMS to LAD to 2009. 08/2013 LVEF 30-35%.  - he has BiV AICD placed by [name], followed by [name] - last cath showed tight non-dominant RCA lesion 07/2013, this was elected for medical management.   - limiting salt intake, avoiding NSAIDs  - compliant with meds, taking 50 mg daily Toprol XL, reports fatigue with higher doses. Was on enalapril 10mg  bid, was held during recent admission in setting AKI . Last visit restarted enalapril 10mg  bid, potassium and Cr remain stbale.   - no chest pain, denies any SOB or DOE. No orthopnea, no PND, no [name] edema -  2. HTN  - compliant with meds  3. Afib/Aflutter - denies any palpitations  - compliant with coumadin, denies any bleeding problems,  - followed by EP, last visit decreased amio to 200mg  daily  4. Hyperlipidemia  - compliant with statin crestor 40mg  daily  - panel 07/2013 showed TC 182 TG 122 HDL 37 LDL 121  5. Dizziness - feeling of room spinning, nausea. Occurs every few months. No other associated symptoms.    Past Medical History  Diagnosis Date  . Essential hypertension, benign   . Type 2 diabetes mellitus   . Coronary atherosclerosis of native coronary artery     BMS to LAD 2001 at [name], [name] ramus and BMS to LAD 2009  . S/P colonoscopy     Normal via ostomy - September 2009  . Chronic systolic heart failure   . Myocardial infarction, anterior wall     Treated with tPA at [name] 2000  . GERD (gastroesophageal reflux disease)   . Cardiomyopathy, ischemic     BIV ICD [name], LVEF 23%  . Paroxysmal atrial fibrillation     Amiodarone and Coumadin  . 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 Prostate cancer   . 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 Transaminitis 12/12/2013  . CHF (congestive heart failure)      No Known  Allergies   Current Outpatient Prescriptions  Medication Sig Dispense Refill  . amiodarone (PACERONE) 200 MG tablet Take 1 tablet (200 mg total) by mouth daily.  30 tablet  1  . aspirin 81 MG EC tablet Take 81 mg by mouth at bedtime.       . citalopram (CELEXA) 20 MG tablet Take 10 mg by mouth daily.      Marland Kitchen co-enzyme Q-10 50 MG capsule Take 50 mg by mouth every morning.       . digoxin (LANOXIN) 0.125 MG tablet Take 0.125 mg by mouth every other day.       . fluticasone (FLONASE) 50 MCG/ACT nasal spray Place 1 spray into both nostrils daily as needed for allergies.       . furosemide (LASIX) 20 MG tablet Take 20 mg by mouth daily.       . insulin aspart (NOVOLOG) 100 UNIT/ML injection Inject 12-14 Units into the skin 3 (three) times daily as needed for high blood sugar. 14 units at breakfast and 12 units a lunch and 12 units at supper per sliding scale       . insulin glargine (LANTUS) 100 UNIT/ML injection Inject 24 Units into the skin at bedtime.       . isosorbide mononitrate (IMDUR) 30 MG 24 hr tablet Take  15 mg by mouth 2 (two) times daily.      . metFORMIN (GLUCOPHAGE) 500 MG tablet Take 500 mg by mouth 2 (two) times daily with a meal.      . metoprolol succinate (TOPROL-XL) 50 MG 24 hr tablet Take 1 tablet (50 mg total) by mouth daily. Take with or immediately following a meal.  30 tablet  0  . niacin (NIASPAN) 1000 MG CR tablet Take 1,000 mg by mouth at bedtime.        . nitroGLYCERIN (NITROLINGUAL) 0.4 MG/SPRAY spray Place 1 spray under the tongue every 5 (five) minutes as needed. angina      . Omega-3 Fatty Acids (FISH OIL) 1200 MG CAPS Take 1,200 mg by mouth 2 (two) times daily.        . pantoprazole (PROTONIX) 40 MG tablet Take 1 tablet (40 mg total) by mouth daily.  30 tablet  6  . ranolazine (RANEXA) 1000 MG SR tablet Take 1,000 mg by mouth 2 (two) times daily.      . rosuvastatin (CRESTOR) 40 MG tablet Take 40 mg by mouth at bedtime.      Marland Kitchen spironolactone (ALDACTONE) 25 MG  tablet Take 1 tablet (25 mg total) by mouth daily.  45 tablet  6  . warfarin (COUMADIN) 2.5 MG tablet Take 2.5 mg by mouth daily at 6 PM.       No current facility-administered medications for this visit.     Past Surgical History  Procedure Laterality Date  . Internal defibrillator and pacemaker  2010    x2 [name]  . Abdominal and perineal resection of rectum with total mesorectal excision      10/04/2007  . Colonoscopy      05/17/2007. IMPRESSION: Semilunar, apple-core neoplasm low in the rectum (palpable on digital rectal exam) beginning at 5 cm and corkscrewing up 5 cm in length. This was a low rectal lesion consistent with colorectal carcinoma. It was biosied multiple times. The upstream colon all the way to the cecum appeared normal. Recommendations: Followup on path. Surgical Consultation   . Colonoscopy  09/14/2011    Procedure: COLONOSCOPY;  Surgeon: Robert Dolin, MD;  [facility]: [facility];  Service: Endoscopy;  Laterality: N/A;  8:30- TCS via colostomy & pt has defibrillator  . Colostomy       No Known Allergies    Family History  Problem Relation Age of Onset  . Colon cancer Mother 61  . Colon cancer Sister 58  . Coronary artery disease Father      Social History [name] reports that he has never smoked. He has never used smokeless tobacco. [name] reports that he does not drink alcohol.   Review of Systems CONSTITUTIONAL: No weight loss, fever, chills, weakness or fatigue.  HEENT: Eyes: No visual loss, blurred vision, double vision or yellow sclerae.No hearing loss, sneezing, congestion, runny nose or sore throat.  SKIN: No rash or itching.  CARDIOVASCULAR: per HPI RESPIRATORY: No shortness of breath, cough or sputum.  GASTROINTESTINAL: No anorexia, nausea, vomiting or diarrhea. No abdominal pain or blood.  GENITOURINARY: No burning on urination, no polyuria NEUROLOGICAL: occasional dizziness MUSCULOSKELETAL: No muscle, back pain, joint  pain or stiffness.  LYMPHATICS: No enlarged nodes. No history of splenectomy.  PSYCHIATRIC: No history of depression or anxiety.  ENDOCRINOLOGIC: No reports of sweating, cold or heat intolerance. No polyuria or polydipsia.  Marland Kitchen   Physical Examination p 70 bp 107/64 Wt 216 lbs BMI 31 Gen: resting  comfortably, no acute distress HEENT: no scleral icterus, pupils equal round and reactive, no palptable cervical adenopathy,  CV: RRR, no m/r/g, no JVD, no carotid bruits Resp: Clear to auscultation bilaterally GI: abdomen is soft, non-tender, non-distended, normal bowel sounds, no hepatosplenomegaly MSK: extremities are warm, no edema.  Skin: warm, no rash Neuro:  no focal deficits Psych: appropriate affect   Diagnostic Studies 08/2013 Echo  LVEF 30-35%, + WMAs, grade I diastolic dysfunction, mild MR,  07/2013 Cath  RESULTS: Left main coronary artery. The left main coronary artery is  free of significant disease.  Left anterior descending artery. The left anterior descending artery  gave rise to three septal perforators and two diagonal branches. These  and __________ were free of significant disease.  The circumflex artery: The circumflex artery gave rise to a ramus  Kawehi Hostetter, a marginal Jearlean Demauro, and a small posterolateral Teea Ducey. These  vessels were free of significant disease.  The right coronary artery: The right coronary artery was a moderate-  sized vessel that gave rise to posterior descending Miriya Cloer and three  posterolateral branches. The vessel also gave rise to a right  ventricular Leilene Diprima. There was an 80-90% stenosis at the ostium of the  right coronary artery. We could see some collateral filling from the  left coronary artery, which appeared to be right ventricular branches.  The left ventriculogram: The left ventriculogram performed in the RAO  projection showed good wall motion with no areas of hypokinesis. The  estimated ejection fraction was 60%.  The left ventricular  pressure was 154/28 and __________ pressure was  154/86 with a mean of 116.  CONCLUSION: Coronary artery disease with 80-90% stenosis in the ostium  of the right coronary artery, no significant obstruction in the LAD and  circumflex arteries and normal LV function.  RECOMMENDATIONS: The patient has a tight what appears to be flow-  limiting lesion in the ostium of the right coronary artery. We will  plan to schedule the patient for to return for percutaneous coronary  intervention on Tuesday April 13. In the meantime, we will start Plavix  and continue aspirin and start the Toprol-XL 25 mg.     Assessment and Plan  1.CAD/ICM  - LVEF 30-35% by echo 08/2013, NYHA III, he has a BiV AICD  - appears euvolemic today  - continue beta blocker at current dose, reports fatigue on higher doses. Last visit restarted ACE-I, had been stopped during prior admission with AKI. K and Cr remain stable  2. HTN  - at goal, continue current meds   3. Hyperlipidemia  - continue crestor 40, needs repeat panel in next few monhts  4. Afib /aflutter - no current symptoms, continue current medications and coumadin for anticoagulation - discussed possible ablation with EP last visit for flutter, currently patient is not interested.       Arnoldo Lenis, M.D., F.A.C.C.

## 2014-02-12 NOTE — Patient Instructions (Addendum)
Your physician recommends that you schedule a follow-up appointment in:  3 months    Your physician recommends that you continue on your current medications as directed. Please refer to the Current Medication list given to you today.     Thank you for choosing [facility] !   

## 2014-02-14 NOTE — Telephone Encounter (Signed)
[name], if you have a cancellation will you get pt appt.

## 2014-02-18 NOTE — Telephone Encounter (Signed)
If I come across a cancellation I will call patient, but can't guarantee

## 2014-02-19 DIAGNOSIS — H6980 Other specified disorders of Eustachian tube, unspecified ear: Secondary | ICD-10-CM | POA: Diagnosis not present

## 2014-02-19 DIAGNOSIS — H9319 Tinnitus, unspecified ear: Secondary | ICD-10-CM | POA: Diagnosis not present

## 2014-02-19 DIAGNOSIS — H905 Unspecified sensorineural hearing loss: Secondary | ICD-10-CM | POA: Diagnosis not present

## 2014-02-19 DIAGNOSIS — J310 Chronic rhinitis: Secondary | ICD-10-CM | POA: Diagnosis not present

## 2014-02-19 DIAGNOSIS — H903 Sensorineural hearing loss, bilateral: Secondary | ICD-10-CM | POA: Diagnosis not present

## 2014-02-20 ENCOUNTER — Encounter (HOSPITAL_COMMUNITY): Payer: Self-pay | Admitting: Emergency Medicine

## 2014-02-20 ENCOUNTER — Emergency Department (HOSPITAL_COMMUNITY)
Admission: EM | Admit: 2014-02-20 | Discharge: 2014-02-21 | Disposition: A | Discharge status: Home / Self Care | Payer: Medicare Other | Attending: Emergency Medicine | Admitting: Emergency Medicine

## 2014-02-20 DIAGNOSIS — R112 Nausea with vomiting, unspecified: Secondary | ICD-10-CM | POA: Insufficient documentation

## 2014-02-20 DIAGNOSIS — Z794 Long term (current) use of insulin: Secondary | ICD-10-CM | POA: Diagnosis not present

## 2014-02-20 DIAGNOSIS — I10 Essential (primary) hypertension: Secondary | ICD-10-CM | POA: Insufficient documentation

## 2014-02-20 DIAGNOSIS — R42 Dizziness and giddiness: Secondary | ICD-10-CM

## 2014-02-20 DIAGNOSIS — Z95810 Presence of automatic (implantable) cardiac defibrillator: Secondary | ICD-10-CM | POA: Diagnosis not present

## 2014-02-20 DIAGNOSIS — E119 Type 2 diabetes mellitus without complications: Secondary | ICD-10-CM | POA: Diagnosis not present

## 2014-02-20 DIAGNOSIS — Z79899 Other long term (current) drug therapy: Secondary | ICD-10-CM | POA: Insufficient documentation

## 2014-02-20 DIAGNOSIS — Z7901 Long term (current) use of anticoagulants: Secondary | ICD-10-CM | POA: Insufficient documentation

## 2014-02-20 DIAGNOSIS — Z7982 Long term (current) use of aspirin: Secondary | ICD-10-CM | POA: Diagnosis not present

## 2014-02-20 DIAGNOSIS — K219 Gastro-esophageal reflux disease without esophagitis: Secondary | ICD-10-CM | POA: Diagnosis not present

## 2014-02-20 DIAGNOSIS — I5022 Chronic systolic (congestive) heart failure: Secondary | ICD-10-CM | POA: Diagnosis not present

## 2014-02-20 DIAGNOSIS — I252 Old myocardial infarction: Secondary | ICD-10-CM | POA: Insufficient documentation

## 2014-02-20 DIAGNOSIS — Z85048 Personal history of other malignant neoplasm of rectum, rectosigmoid junction, and anus: Secondary | ICD-10-CM | POA: Insufficient documentation

## 2014-02-20 DIAGNOSIS — N289 Disorder of kidney and ureter, unspecified: Secondary | ICD-10-CM | POA: Diagnosis not present

## 2014-02-20 DIAGNOSIS — I2510 Atherosclerotic heart disease of native coronary artery without angina pectoris: Secondary | ICD-10-CM | POA: Insufficient documentation

## 2014-02-20 DIAGNOSIS — I4891 Unspecified atrial fibrillation: Secondary | ICD-10-CM | POA: Insufficient documentation

## 2014-02-20 DIAGNOSIS — Z8546 Personal history of malignant neoplasm of prostate: Secondary | ICD-10-CM | POA: Diagnosis not present

## 2014-02-20 DIAGNOSIS — K829 Disease of gallbladder, unspecified: Secondary | ICD-10-CM | POA: Diagnosis not present

## 2014-02-20 DIAGNOSIS — R7301 Impaired fasting glucose: Secondary | ICD-10-CM | POA: Diagnosis not present

## 2014-02-20 DIAGNOSIS — R7309 Other abnormal glucose: Secondary | ICD-10-CM | POA: Diagnosis not present

## 2014-02-20 MED ORDER — MECLIZINE HCL 25 MG PO TABS
25.0000 mg | ORAL_TABLET | Freq: Once | ORAL | Status: AC
Start: 2014-02-20 — End: 2014-02-21
  Administered 2014-02-21: 25 mg via ORAL
  Filled 2014-02-20: qty 1

## 2014-02-20 NOTE — ED Notes (Signed)
Per EMS, pt ate at [employer] tonight and when he got home he had an episode of dizziness followed by some nausea and vomiting. Pt has a pacemake/ defibrillator pt is vent paced at all times. Pt had another episode of nausea in route and was given 4 mg Zofran IV. Pt Denies nausea at this time. Pt alert x 4. NAD at this time.  CBG: 389 (pt gave himself 12 units of Novolog after dinner.)

## 2014-02-20 NOTE — ED Provider Notes (Signed)
CSN: [id number]     Arrival date & time 02/20/14  2233 History   First MD Initiated Contact with Patient 02/20/14 2329     Chief Complaint  Patient presents with  . Dizziness  . Nausea     (Consider location/radiation/quality/duration/timing/severity/associated sxs/prior Treatment) Patient is a 58 y.o. male presenting with dizziness. The history is provided by the patient.  Dizziness He had an episode tonight of he dizziness and nausea and vomiting. Dizziness is described as the room spinning around. When this happened, he was off balance and his wife was unable to get him up. Episode lasted about 45 minutes it has now resolved. He is been having similar episodes since last August. They're getting more frequent and more severe. He was admitted to the hospital recently with no answer being found. He has also been evaluated by a neurologist and an ear nose throat physician. He has a history of heart failure and is on daily aspirin as well as daily warfarin. He has an implanted pacemaker defibrillator. He denies headaches or vision changes. He denies ear pain or tinnitus. He denies any medication changes over the last 8-10 months other than reduction in his dose of metoprolol.  Past Medical History  Diagnosis Date  . Essential hypertension, benign   . Type 2 diabetes mellitus   . Coronary atherosclerosis of native coronary artery     BMS to LAD 2001 at [facility], PTCA/atherectomy ramus and BMS to LAD 2009  . S/P colonoscopy     Normal via ostomy - September 2009  . Chronic systolic heart failure   . Myocardial infarction, anterior wall     Treated with tPA at [facility] 2000  . GERD (gastroesophageal reflux disease)   . Cardiomyopathy, ischemic     BIV ICD [employer], LVEF 23%  . Paroxysmal atrial fibrillation     Amiodarone and Coumadin  . 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 Prostate cancer   . 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 Transaminitis 12/12/2013  . CHF  (congestive heart failure)    Past Surgical History  Procedure Laterality Date  . Internal defibrillator and pacemaker  2010    x2 [employer] device  . Abdominal and perineal resection of rectum with total mesorectal excision      10/04/2007  . Colonoscopy      05/17/2007. IMPRESSION: Semilunar, apple-core neoplasm low in the rectum (palpable on digital rectal exam) beginning at 5 cm and corkscrewing up 5 cm in length. This was a low rectal lesion consistent with colorectal carcinoma. It was biosied multiple times. The upstream colon all the way to the cecum appeared normal. Recommendations: Followup on path. Surgical Consultation   . Colonoscopy  09/14/2011    Procedure: COLONOSCOPY;  Surgeon: Daneil Dolin, MD;  [facility];  Service: Endoscopy;  Laterality: N/A;  8:30- TCS via colostomy & pt has defibrillator  . Colostomy     Family History  Problem Relation Age of Onset  . Colon cancer Mother 4  . Colon cancer Sister 56  . Coronary artery disease Father    History  Substance Use Topics  . Smoking status: Never Smoker   . Smokeless tobacco: Never Used  . Alcohol Use: No     Comment: Former user    Review of Systems  Neurological: Positive for dizziness.  All other systems reviewed and are negative.      Allergies  Review of patient's allergies indicates no  known allergies.  Home Medications   Current Outpatient Rx  Name  Route  Sig  Dispense  Refill  . amiodarone (PACERONE) 200 MG tablet   Oral   Take 1 tablet (200 mg total) by mouth daily.   30 tablet   1   . aspirin 81 MG EC tablet   Oral   Take 81 mg by mouth at bedtime.          . citalopram (CELEXA) 20 MG tablet   Oral   Take 10 mg by mouth daily.         Marland Kitchen co-enzyme Q-10 50 MG capsule   Oral   Take 50 mg by mouth every morning.          . digoxin (LANOXIN) 0.125 MG tablet   Oral   Take 0.125 mg by mouth every other day.          . fluticasone (FLONASE) 50 MCG/ACT nasal  spray   Each Nare   Place 1 spray into both nostrils daily as needed for allergies.          . furosemide (LASIX) 20 MG tablet   Oral   Take 20 mg by mouth daily.          . insulin aspart (NOVOLOG) 100 UNIT/ML injection   Subcutaneous   Inject 12-14 Units into the skin 3 (three) times daily as needed for high blood sugar. 14 units at breakfast and 12 units a lunch and 12 units at supper per sliding scale          . insulin glargine (LANTUS) 100 UNIT/ML injection   Subcutaneous   Inject 24 Units into the skin at bedtime.          . isosorbide mononitrate (IMDUR) 30 MG 24 hr tablet   Oral   Take 15 mg by mouth 2 (two) times daily.         . metFORMIN (GLUCOPHAGE) 500 MG tablet   Oral   Take 500 mg by mouth 2 (two) times daily with a meal.         . metoprolol succinate (TOPROL-XL) 50 MG 24 hr tablet   Oral   Take 1 tablet (50 mg total) by mouth daily. Take with or immediately following a meal.   30 tablet   0   . niacin (NIASPAN) 1000 MG CR tablet   Oral   Take 1,000 mg by mouth at bedtime.           . nitroGLYCERIN (NITROLINGUAL) 0.4 MG/SPRAY spray   Sublingual   Place 1 spray under the tongue every 5 (five) minutes as needed. angina         . Omega-3 Fatty Acids (FISH OIL) 1200 MG CAPS   Oral   Take 1,200 mg by mouth 2 (two) times daily.           . pantoprazole (PROTONIX) 40 MG tablet   Oral   Take 1 tablet (40 mg total) by mouth daily.   30 tablet   6   . ranolazine (RANEXA) 1000 MG SR tablet   Oral   Take 1,000 mg by mouth 2 (two) times daily.         . rosuvastatin (CRESTOR) 40 MG tablet   Oral   Take 40 mg by mouth at bedtime.         Marland Kitchen spironolactone (ALDACTONE) 25 MG tablet   Oral   Take 1 tablet (25 mg total) by mouth daily.  45 tablet   6   . warfarin (COUMADIN) 2.5 MG tablet   Oral   Take 2.5 mg by mouth daily at 6 PM.          BP 105/59  Pulse 69  Temp(Src) 97.1 F (36.2 C) (Oral)  Resp 18  SpO2  100% Physical Exam  Nursing note and vitals reviewed.  58 year old male, resting comfortably and in no acute distress. Vital signs are normal. Oxygen saturation is 100%, which is normal. Head is normocephalic and atraumatic. PERRLA, EOMI. Oropharynx is clear. Neck is nontender and supple without adenopathy or JVD. There are no carotid bruits. Back is nontender and there is no CVA tenderness. Lungs are clear without rales, wheezes, or rhonchi. Chest is nontender. Heart has regular rate and rhythm without murmur. Abdomen is soft, flat, nontender without masses or hepatosplenomegaly and peristalsis is normoactive. Extremities have no cyanosis or edema, full range of motion is present. Venous stasis changes are present. Skin is warm and dry without rash. Neurologic: Mental status is normal, cranial nerves are intact, there are no motor or sensory deficits. Finger to nose testing is normal.  ED Course  Procedures (including critical care time) Labs Review Labs Reviewed - No data to display Imaging Review No results found.   EKG Interpretation   Date/Time:  Wednesday February 20 2014 22:33:07 EDT Ventricular Rate:  70 PR Interval:    QRS Duration: 208 QT Interval:  560 QTC Calculation: 604 R Axis:   -177 Text Interpretation:  Ventricular-paced rhythm Abnormal ECG When compared  with ECG of 01/26/2014, No significant change was found Confirmed by [facility]   MD, Caliber Landess ([id number]) on 02/20/2014 11:30:09 PM      MDM   Final diagnoses:  None    Episodes of dizziness with loss of balance and nausea which seems most consistent with vertigo. Sudden onset and nausea or are more consistent with peripheral vertigo. Severe loss of balance is more consistent with central vertigo. These could conceivably represent posterior circulation TIAs. However, he is already on aspirin. Old records are reviewed and he was hospitalized in February and workup was unremarkable. However, MRI was not able to be done  because of presence of pacemaker-defibrillator, and CT angiogram was unable to be done because of renal insufficiency. Carotid ultrasounds showed obstruction less than 50%. His wife was concerned that gallbladder problems could be causing his symptoms in that she had family members who had gallbladder problems but just nausea. Accordingly, abdominal ultrasound will be obtained. He is given a dose of meclizine in the ED.  There's been no recurrence of his dizziness following meclizine. Ultrasound shows possible gallbladder polyp and thickened gallbladder wall. He is referred to surgery for followup although I tend to doubt that it is causing his symptoms. He'll be discharged with a prescription for meclizine to see if it causes some improvement. May need to consider switching from aspirin to Aggrenox which would treat TIAs. Laboratory workup does show renal insufficiency which has been slightly worse than baseline and is in need to be followed as an outpatient.    Delora Fuel, MD [id number]

## 2014-02-21 ENCOUNTER — Emergency Department (HOSPITAL_COMMUNITY): Payer: Medicare Other

## 2014-02-21 DIAGNOSIS — K829 Disease of gallbladder, unspecified: Secondary | ICD-10-CM | POA: Diagnosis not present

## 2014-02-21 LAB — PROTIME-INR
INR: 1.85 — ABNORMAL HIGH (ref 0.00–1.49)
Prothrombin Time: 20.8 seconds — ABNORMAL HIGH (ref 11.6–15.2)

## 2014-02-21 LAB — BASIC METABOLIC PANEL
BUN: 23 mg/dL (ref 6–23)
CO2: 23 mEq/L (ref 19–32)
Calcium: 9.6 mg/dL (ref 8.4–10.5)
Chloride: 101 mEq/L (ref 96–112)
Creatinine, Ser: 1.94 mg/dL — ABNORMAL HIGH (ref 0.50–1.35)
GFR calc Af Amer: 42 mL/min — ABNORMAL LOW (ref >90)
GFR calc non Af Amer: 36 mL/min — ABNORMAL LOW (ref >90)
GLUCOSE: 250 mg/dL — AB (ref 70–99)
Potassium: 4.1 mEq/L (ref 3.7–5.3)
Sodium: 137 mEq/L (ref 137–147)

## 2014-02-21 LAB — CBC WITH DIFFERENTIAL/PLATELET
Basophils Absolute: 0 10*3/uL (ref 0.0–0.1)
Basophils Relative: 0 % (ref 0–1)
EOS ABS: 0 10*3/uL (ref 0.0–0.7)
Eosinophils Relative: 0 % (ref 0–5)
HCT: 39 % (ref 39.0–52.0)
Hemoglobin: 12.9 g/dL — ABNORMAL LOW (ref 13.0–17.0)
LYMPHS ABS: 1 10*3/uL (ref 0.7–4.0)
LYMPHS PCT: 11 % — AB (ref 12–46)
MCH: 26.9 pg (ref 26.0–34.0)
MCHC: 33.1 g/dL (ref 30.0–36.0)
MCV: 81.4 fL (ref 78.0–100.0)
Monocytes Absolute: 0.7 10*3/uL (ref 0.1–1.0)
Monocytes Relative: 7 % (ref 3–12)
NEUTROS PCT: 82 % — AB (ref 43–77)
Neutro Abs: 7.7 10*3/uL (ref 1.7–7.7)
PLATELETS: 164 10*3/uL (ref 150–400)
RBC: 4.79 MIL/uL (ref 4.22–5.81)
RDW: 15.6 % — ABNORMAL HIGH (ref 11.5–15.5)
WBC: 9.4 10*3/uL (ref 4.0–10.5)

## 2014-02-21 LAB — DIGOXIN LEVEL: DIGOXIN LVL: 0.7 ng/mL — AB (ref 0.8–2.0)

## 2014-02-21 MED ORDER — MECLIZINE HCL 25 MG PO TABS: 25.0000 mg | ORAL_TABLET | Freq: Three times a day (TID) | ORAL | Status: DC | PRN

## 2014-02-21 NOTE — Discharge Instructions (Signed)
Make an appointment with the surgeon to see if he feels your gallbladder may be contributing to your symptoms. Take meclizine three times a day for the next several days to see if it improves your symptoms.   Dizziness Dizziness is a common problem. It is a feeling of unsteadiness or lightheadedness. You may feel like you are about to faint. Dizziness can lead to injury if you stumble or fall. A person of any age group can suffer from dizziness, but dizziness is more common in older adults. CAUSES  Dizziness can be caused by many different things, including:  Middle ear problems.  Standing for too long.  Infections.  An allergic reaction.  Aging.  An emotional response to something, such as the sight of blood.  Side effects of medicines.  Fatigue.  Problems with circulation or blood pressure.  Excess use of alcohol, medicines, or illegal drug use.  Breathing too fast (hyperventilation).  An arrhythmia or problems with your heart rhythm.  Low red blood cell count (anemia).  Pregnancy.  Vomiting, diarrhea, fever, or other illnesses that cause dehydration.  Diseases or conditions such as Parkinson's disease, high blood pressure (hypertension), diabetes, and thyroid problems.  Exposure to extreme heat. DIAGNOSIS  To find the cause of your dizziness, your caregiver may do a physical exam, lab tests, radiologic imaging scans, or an electrocardiography test (ECG).  TREATMENT  Treatment of dizziness depends on the cause of your symptoms and can vary greatly. HOME CARE INSTRUCTIONS   Drink enough fluids to keep your urine clear or pale yellow. This is especially important in very hot weather. In the elderly, it is also important in cold weather.  If your dizziness is caused by medicines, take them exactly as directed. When taking blood pressure medicines, it is especially important to get up slowly.  Rise slowly from chairs and steady yourself until you feel okay.  In the  morning, first sit up on the side of the bed. When this seems okay, stand slowly while holding onto something until you know your balance is fine.  If you need to stand in one place for a long time, be sure to move your legs often. Tighten and relax the muscles in your legs while standing.  If dizziness continues to be a problem, have someone stay with you for a day or two. Do this until you feel you are well enough to stay alone. Have the person call your caregiver if he or she notices changes in you that are concerning.  Do not drive or use heavy machinery if you feel dizzy.  Do not drink alcohol. SEEK IMMEDIATE MEDICAL CARE IF:   Your dizziness or lightheadedness gets worse.  You feel nauseous or vomit.  You develop problems with talking, walking, weakness, or using your arms, hands, or legs.  You are not thinking clearly or you have difficulty forming sentences. It may take a friend or family member to determine if your thinking is normal.  You develop chest pain, abdominal pain, shortness of breath, or sweating.  Your vision changes.  You notice any bleeding.  You have side effects from medicine that seems to be getting worse rather than better. MAKE SURE YOU:   Understand these instructions.  Will watch your condition.  Will get help right away if you are not doing well or get worse. Document Released: 05/18/2001 Document Revised: 02/14/2012 Document Reviewed: 06/11/2011 [facility] Patient Information 2014 [place], [place].  Meclizine tablets or capsules What is this medicine? MECLIZINE (MEK  li zeen) is an antihistamine. It is used to prevent nausea, vomiting, or dizziness caused by motion sickness. It is also used to prevent and treat vertigo (extreme dizziness or a feeling that you or your surroundings are tilting or spinning around). This medicine may be used for other purposes; ask your health care provider or pharmacist if you have questions. COMMON BRAND NAME(S):  Antivert, Dramamine Less Drowsy, Medivert, Meni-D  What should I tell my health care provider before I take this medicine? They need to know if you have any of these conditions: -asthma -glaucoma -prostate trouble -stomach problems -urinary problems -an unusual or allergic reaction to meclizine, other medicines, foods, dyes, or preservatives -pregnant or trying to get pregnant -breast-feeding How should I use this medicine? Take this medicine by mouth with a glass of water. Follow the directions on the prescription label. If you are using this medicine to prevent motion sickness, take the dose at least 1 hour before travel. If it upsets your stomach, take it with food or milk. Take your doses at regular intervals. Do not take your medicine more often than directed. Talk to your pediatrician regarding the use of this medicine in children. Special care may be needed. Overdosage: If you think you have taken too much of this medicine contact a poison control center or emergency room at once. NOTE: This medicine is only for you. Do not share this medicine with others. What if I miss a dose? If you miss a dose, take it as soon as you can. If it is almost time for your next dose, take only that dose. Do not take double or extra doses. What may interact with this medicine? -barbiturate medicines for inducing sleep or treating seizures -digoxin -medicines for anxiety or sleeping problems, like alprazolam, diazepam or temazepam -medicines for hay fever and other allergies -medicines for mental depression -medicines for movement abnormalities as in Parkinson's disease, or for stomach problems -medicines for pain -medicines that relax muscles This list may not describe all possible interactions. Give your health care provider a list of all the medicines, herbs, non-prescription drugs, or dietary supplements you use. Also tell them if you smoke, drink alcohol, or use illegal drugs. Some items may  interact with your medicine. What should I watch for while using this medicine? If you are taking this medicine on a regular schedule, visit your doctor or health care professional for regular checks on your progress. You may get dizzy, drowsy or have blurred vision. Do not drive, use machinery, or do anything that needs mental alertness until you know how this medicine affects you. Do not stand or sit up quickly, especially if you are an older patient. This reduces the risk of dizzy or fainting spells. Alcohol can increase possible dizziness. Avoid alcoholic drinks. Your mouth may get dry. Chewing sugarless gum or sucking hard candy, and drinking plenty of water may help. Contact your doctor if the problem does not go away or is severe. This medicine may cause dry eyes and blurred vision. If you wear contact lenses you may feel some discomfort. Lubricating drops may help. See your eye doctor if the problem does not go away or is severe. What side effects may I notice from receiving this medicine? Side effects that you should report to your doctor or health care professional as soon as possible: -fainting spells -fast or irregular heartbeat Side effects that usually do not require medical attention (report to your doctor or health care professional if they continue  or are bothersome): -constipation -difficulty passing urine -difficulty sleeping -headache -stomach upset This list may not describe all possible side effects. Call your doctor for medical advice about side effects. You may report side effects to FDA at 1-800-FDA-1088. Where should I keep my medicine? Keep out of the reach of children. Store at room temperature between 15 and 30 degrees C (59 and 86 degrees F). Keep container tightly closed. Throw away any unused medicine after the expiration date. NOTE: This sheet is a summary. It may not cover all possible information. If you have questions about this medicine, talk to your doctor,  pharmacist, or health care provider.  2014, [employer]. (2008-05-30 10:35:36)

## 2014-02-22 ENCOUNTER — Ambulatory Visit (HOSPITAL_BASED_OUTPATIENT_CLINIC_OR_DEPARTMENT_OTHER): Payer: Medicare Other | Admitting: Nurse Practitioner

## 2014-02-22 ENCOUNTER — Other Ambulatory Visit (HOSPITAL_BASED_OUTPATIENT_CLINIC_OR_DEPARTMENT_OTHER): Payer: Medicare Other

## 2014-02-22 ENCOUNTER — Telehealth: Payer: Self-pay | Admitting: Oncology

## 2014-02-22 VITALS — BP 117/62 | HR 70 | Temp 97.6°F | Resp 20 | Ht 70.0 in | Wt 213.2 lb

## 2014-02-22 DIAGNOSIS — Z85038 Personal history of other malignant neoplasm of large intestine: Secondary | ICD-10-CM

## 2014-02-22 DIAGNOSIS — E119 Type 2 diabetes mellitus without complications: Secondary | ICD-10-CM

## 2014-02-22 DIAGNOSIS — C20 Malignant neoplasm of rectum: Secondary | ICD-10-CM

## 2014-02-22 DIAGNOSIS — I2589 Other forms of chronic ischemic heart disease: Secondary | ICD-10-CM | POA: Diagnosis not present

## 2014-02-22 DIAGNOSIS — R42 Dizziness and giddiness: Secondary | ICD-10-CM

## 2014-02-22 NOTE — Telephone Encounter (Signed)
gv and printed appt sched and avs for pt for SEpt. °

## 2014-02-22 NOTE — Progress Notes (Signed)
OFFICE PROGRESS NOTE  Interval history:  [name] returns for followup of rectal cancer. No change in bowel habits. He reports the colostomy is functioning normally. No bloody or black stools. He has an appointment with his GI doctor at the end of this month. He plans to discuss the timing of his next colonoscopy. He has a good appetite.  He reports several admissions since his last visit here for evaluation of dizziness. He is now being treated for vertigo.   Objective: Filed Vitals:   02/22/14 1433  BP: 117/62  Pulse: 70  Temp: 97.6 F (36.4 C)  Resp: 20   Oropharynx is without thrush or ulceration. No palpable cervical, supraclavicular or axillary lymph nodes. Prominent bilateral axillary fat pads. Approximate 1 cm medial left inguinal node. Lungs clear. Irregular cardiac rhythm. Abdomen soft and nontender. No hepatomegaly. Left lower quadrant colostomy. No leg edema. Perineal scar is without evidence of recurrent tumor.   Lab Results: Lab Results  Component Value Date   WBC 9.4 02/20/2014   HGB 12.9* 02/20/2014   HCT 39.0 02/20/2014   MCV 81.4 02/20/2014   PLT 164 02/20/2014   NEUTROABS 7.7 02/20/2014    Chemistry:    Chemistry      Component Value Date/Time   NA 137 02/20/2014 2351   K 4.1 02/20/2014 2351   CL 101 02/20/2014 2351   CO2 23 02/20/2014 2351   BUN 23 02/20/2014 2351   CREATININE 1.94* 02/20/2014 2351   CREATININE 1.63* 01/10/2014 1059      Component Value Date/Time   CALCIUM 9.6 02/20/2014 2351   ALKPHOS 101 01/26/2014 2127   AST 41* 01/26/2014 2127   ALT 39 01/26/2014 2127   BILITOT 0.8 01/26/2014 2127       Studies/Results: Dg Chest 1 View  01/26/2014   CLINICAL DATA:  Dizziness, weakness and unsteady gait. Systolic heart failure.  EXAM: CHEST - 1 VIEW  COMPARISON:  DG CHEST 1V PORT dated 12/11/2013  FINDINGS: Cardiac silhouette remains moderately enlarged, mediastinal silhouette is nonsuspicious. Biventricular left cardiac defibrillator in situ. Lungs are  clear, no pleural effusions or focal consolidations, improved aeration from prior examination. No pneumothorax.  Multiple EKG lines overlie the patient and may obscure subtle underlying pathology. Mild degenerative change of the thoracic spine.  IMPRESSION: Stable cardiomegaly, no acute pulmonary process, improved aeration of the lungs from December 11, 2013.   Electronically Signed   By: Elon Alas   On: 01/26/2014 22:49   Ct Head Wo Contrast  01/28/2014   CLINICAL DATA:  Followup for ataxia.  History of colon carcinoma.  EXAM: CT HEAD WITHOUT CONTRAST  TECHNIQUE: Contiguous axial images were obtained from the base of the skull through the vertex without intravenous contrast.  COMPARISON:  01/26/2014  FINDINGS: Ventricles are normal size for this patient's age and normal in configuration.  No parenchymal masses or mass effect. There are no areas of abnormal parenchymal attenuation. No evidence of a cortical infarct.  There are no extra-axial masses or abnormal fluid collections. Skullbase vascular calcifications described previously are stable.  No intracranial hemorrhage.  Visualized sinuses, mastoid air cells and middle ear cavities are clear.  IMPRESSION: 1. No acute intracranial abnormalities. No change from the prior study.   Electronically Signed   By: Lajean Manes M.D.   On: 01/28/2014 14:43   Ct Head Wo Contrast  01/26/2014   CLINICAL DATA:  Acute onset dizziness, weakness, and unsteady gait earlier this evening, associated with double vision and nausea/vomiting.  EXAM: CT HEAD WITHOUT CONTRAST  TECHNIQUE: Contiguous axial images were obtained from the base of the skull through the vertex without intravenous contrast.  COMPARISON:  None.  FINDINGS: Ventricular system normal in size and appearance for age. No significant atrophy for age. No mass lesion. No midline shift. No acute hemorrhage or hematoma. No extra-axial fluid collections. No evidence of acute infarction. No focal brain parenchymal  abnormality.  No focal osseous abnormality involving the skull. Visualized paranasal sinuses, bilateral mastoid air cells, and bilateral middle ear cavities well-aerated. 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  IMPRESSION: 1. No acute intracranial abnormality. 2. 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 Non-emergent MRI of the brain and MRA of the intracranial vessels and cervical carotid and vertebral arteries may be helpful if this is a clinical consideration.   Electronically Signed   By: Evangeline Dakin M.D.   On: 01/26/2014 22:23   US Abdomen Complete  02/21/2014   CLINICAL DATA:  Nausea.  EXAM: ULTRASOUND ABDOMEN COMPLETE  COMPARISON:  CT ABD/PELVIS W CM dated 05/26/2010; SP BIOPSY CORE PROSTATE dated 06/27/2007  FINDINGS: Gallbladder:  5 mm echogenic focus without shadowing is noted in the gallbladder. This could represent nonshadowing stone or polyp. This was non mobile. Thickening of the gallbladder wall to 5.1 mm noted. Acute or chronic cholecystitis cannot be excluded. Ultrasound Murphy's sign is negative. No pericholecystic fluid collections are present.  Common bile duct:  Diameter: 6 mm.  Liver:  Echogenic consistent with fatty infiltration and/or hepatocellular disease.  IVC:  No abnormality visualized.  Pancreas:  Visualized portion unremarkable.  Spleen:  Size and appearance within normal limits.  Right Kidney:  Length: 9.9 cm. Echogenicity within normal limits. No mass or hydronephrosis visualized.  Left Kidney:  Length: 9.2 cm. Echogenicity within normal limits. No mass or hydronephrosis visualized.  Abdominal aorta:  No aneurysm visualized.  Other findings:  None.  IMPRESSION: 1. Echogenic non mobile focus in the gallbladder. This could represent a nonshadowing stone or polyp. 2. Thickened gallbladder wall.  Cholecystitis cannot be excluded. Ultrasound Murphy sign is negative. No pericholecystic fluid .   Electronically Signed   By: Marcello Moores  Register   On: 02/21/2014 00:58   US Carotid Duplex Bilateral  01/28/2014   CLINICAL DATA:  Dizziness, coronary artery disease, diabetes.  EXAM: BILATERAL CAROTID DUPLEX ULTRASOUND  TECHNIQUE: Pearline Cables scale imaging, color Doppler and duplex ultrasound was performed of bilateral carotid and vertebral arteries in the neck.  COMPARISON:  None.  REVIEW OF SYSTEMS: Quantification of carotid stenosis is based on velocity parameters that correlate the residual internal carotid diameter with NASCET-based stenosis levels, using the diameter of the distal internal carotid lumen as the denominator for stenosis measurement.  The following velocity measurements were obtained:  PEAK SYSTOLIC/END DIASTOLIC  RIGHT  ICA:                     105/28cm/sec  CCA:                     34/74QV/ZDG  SYSTOLIC ICA/CCA RATIO:  3.87  DIASTOLIC ICA/CCA RATIO: 5.64  ECA:                     61cm/sec  LEFT  ICA:                     81/26cm/sec  CCA:                     [id number]  SYSTOLIC ICA/CCA RATIO:  [id number]  DIASTOLIC ICA/CCA RATIO: [id number]  ECA:                     82cm/sec  FINDINGS: RIGHT CAROTID ARTERY: Smooth on calcified plaque effaces the carotid bulb and extends into the proximal ICA resulting in at least mild stenosis. Normal waveforms and color Doppler signal.  RIGHT VERTEBRAL ARTERY:  Normal flow direction and waveform.  LEFT CAROTID ARTERY: Circumferential partially calcified plaque in the proximal ICA resulting in at least mild stenosis. Normal waveforms and color Doppler signal.  LEFT VERTEBRAL ARTERY: Normal flow direction and waveform.  IMPRESSION: 1. 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 The exam does not exclude plaque ulceration or embolization. Continued surveillance recommended.   Electronically Signed   By: Arne Cleveland M.D.   On: 01/28/2014 13:52     Medications: I have reviewed the patient's current medications.  Assessment/Plan: 1. Stage III rectal cancer, diagnosed in June of 2008: Status post neoadjuvant infusional 5-FU and concurrent radiation. He underwent an APR 10/04/2007 with the pathology confirming stage III disease. He completed 8 cycles of adjuvant FOLFOX therapy on 03/12/2008. A restaging CT 05/26/2010 showed no evidence of metastatic disease. He underwent a colonoscopy in October of 2012 with removal of a single pedunculated polyp-benign pathology 2. Prostate cancer: Status post radiation and 2 years of Lupron therapy per Dr. Terance Hart. 3. History of thrombocytopenia secondary to chemotherapy. The platelet count was normal on 05/02/12. 4. History of delayed nausea secondary to chemotherapy: Improved with Aloxi. 5. History of oxaliplatin neuropathy. 6. Ischemic cardiomyopathy followed by Dr. Rollene Fare. 7. History of bilateral axillary fullness. 8. Diabetes. 9. Status post Port-A-Cath removal. 10. Hospitalization with pneumonia October 2012. 11. Question small bilateral inguinal lymph nodes on exam 04/17/2013-? Small left inguinal node on exam 07/17/2013. He appears to have a small left inguinal lymph node on exam today. 12. Dizziness. He reports being treated for vertigo.   Dispositon-he remains in clinical remission from rectal cancer. We will followup on the CEA from today. He will return for a followup visit and CEA in 6 months. He will followup with his GI doctor regarding the timing of his next colonoscopy.  Plan reviewed with Dr. Benay Spice.   Ned Card ANP/GNP-BC

## 2014-02-23 LAB — CEA: CEA: 1.6 ng/mL (ref 0.0–5.0)

## 2014-02-25 ENCOUNTER — Telehealth: Payer: Self-pay | Admitting: *Deleted

## 2014-02-25 NOTE — Telephone Encounter (Signed)
Message copied by Norma Fredrickson on Mon Feb 25, 2014  4:12 PM ------      Message from: Merceda Elks L      Created: Mon Feb 25, 2014  4:09 PM                   ----- Message -----         From: Ladell Pier, MD         Sent: 02/23/2014   6:59 PM           To: Tania Ade, RN, Ludwig Lean, RN, #            Please call patient, cea is normal, f/u as scheduled ------

## 2014-02-25 NOTE — Telephone Encounter (Signed)
Left Message for patient to call back regarding lab results.   

## 2014-02-27 ENCOUNTER — Telehealth: Payer: Self-pay | Admitting: *Deleted

## 2014-02-27 NOTE — Telephone Encounter (Signed)
Called and informed patient of normal cea and to follow up as scheduled. Per Dr. Sherrill.  Patient verbalized understanding.  

## 2014-02-27 NOTE — Telephone Encounter (Signed)
Message copied by Norma Fredrickson on Wed Feb 27, 2014  5:03 PM ------      Message from: Merceda Elks L      Created: Mon Feb 25, 2014  4:09 PM                   ----- Message -----         From: Ladell Pier, MD         Sent: 02/23/2014   6:59 PM           To: Tania Ade, RN, Ludwig Lean, RN, #            Please call patient, cea is normal, f/u as scheduled ------

## 2014-02-28 ENCOUNTER — Emergency Department (HOSPITAL_COMMUNITY): Payer: Medicare Other

## 2014-02-28 ENCOUNTER — Inpatient Hospital Stay (HOSPITAL_COMMUNITY)
Admission: EM | Admit: 2014-02-28 | Discharge: 2014-03-01 | DRG: 069 | Disposition: A | Discharge status: Home / Self Care | Payer: Medicare Other | Attending: Internal Medicine | Admitting: Internal Medicine

## 2014-02-28 ENCOUNTER — Encounter (HOSPITAL_COMMUNITY): Payer: Self-pay | Admitting: Emergency Medicine

## 2014-02-28 DIAGNOSIS — Z794 Long term (current) use of insulin: Secondary | ICD-10-CM | POA: Diagnosis not present

## 2014-02-28 DIAGNOSIS — I252 Old myocardial infarction: Secondary | ICD-10-CM | POA: Diagnosis not present

## 2014-02-28 DIAGNOSIS — Z8249 Family history of ischemic heart disease and other diseases of the circulatory system: Secondary | ICD-10-CM | POA: Diagnosis not present

## 2014-02-28 DIAGNOSIS — I255 Ischemic cardiomyopathy: Secondary | ICD-10-CM | POA: Diagnosis present

## 2014-02-28 DIAGNOSIS — I2510 Atherosclerotic heart disease of native coronary artery without angina pectoris: Secondary | ICD-10-CM | POA: Diagnosis present

## 2014-02-28 DIAGNOSIS — I2589 Other forms of chronic ischemic heart disease: Secondary | ICD-10-CM | POA: Diagnosis present

## 2014-02-28 DIAGNOSIS — E1165 Type 2 diabetes mellitus with hyperglycemia: Secondary | ICD-10-CM | POA: Diagnosis present

## 2014-02-28 DIAGNOSIS — I4891 Unspecified atrial fibrillation: Secondary | ICD-10-CM | POA: Diagnosis not present

## 2014-02-28 DIAGNOSIS — I5022 Chronic systolic (congestive) heart failure: Secondary | ICD-10-CM | POA: Diagnosis present

## 2014-02-28 DIAGNOSIS — N184 Chronic kidney disease, stage 4 (severe): Secondary | ICD-10-CM | POA: Diagnosis present

## 2014-02-28 DIAGNOSIS — Z9861 Coronary angioplasty status: Secondary | ICD-10-CM | POA: Diagnosis not present

## 2014-02-28 DIAGNOSIS — N183 Chronic kidney disease, stage 3 unspecified: Secondary | ICD-10-CM | POA: Diagnosis present

## 2014-02-28 DIAGNOSIS — K219 Gastro-esophageal reflux disease without esophagitis: Secondary | ICD-10-CM | POA: Diagnosis present

## 2014-02-28 DIAGNOSIS — R42 Dizziness and giddiness: Secondary | ICD-10-CM | POA: Diagnosis not present

## 2014-02-28 DIAGNOSIS — R569 Unspecified convulsions: Secondary | ICD-10-CM | POA: Diagnosis not present

## 2014-02-28 DIAGNOSIS — Z7982 Long term (current) use of aspirin: Secondary | ICD-10-CM | POA: Diagnosis not present

## 2014-02-28 DIAGNOSIS — I10 Essential (primary) hypertension: Secondary | ICD-10-CM | POA: Diagnosis not present

## 2014-02-28 DIAGNOSIS — Z923 Personal history of irradiation: Secondary | ICD-10-CM | POA: Diagnosis not present

## 2014-02-28 DIAGNOSIS — Z933 Colostomy status: Secondary | ICD-10-CM

## 2014-02-28 DIAGNOSIS — R4789 Other speech disturbances: Secondary | ICD-10-CM | POA: Diagnosis not present

## 2014-02-28 DIAGNOSIS — G459 Transient cerebral ischemic attack, unspecified: Secondary | ICD-10-CM | POA: Diagnosis not present

## 2014-02-28 DIAGNOSIS — N189 Chronic kidney disease, unspecified: Secondary | ICD-10-CM

## 2014-02-28 DIAGNOSIS — I480 Paroxysmal atrial fibrillation: Secondary | ICD-10-CM

## 2014-02-28 DIAGNOSIS — Z8546 Personal history of malignant neoplasm of prostate: Secondary | ICD-10-CM

## 2014-02-28 DIAGNOSIS — I129 Hypertensive chronic kidney disease with stage 1 through stage 4 chronic kidney disease, or unspecified chronic kidney disease: Secondary | ICD-10-CM | POA: Diagnosis present

## 2014-02-28 DIAGNOSIS — Z85038 Personal history of other malignant neoplasm of large intestine: Secondary | ICD-10-CM

## 2014-02-28 DIAGNOSIS — Z95810 Presence of automatic (implantable) cardiac defibrillator: Secondary | ICD-10-CM | POA: Diagnosis not present

## 2014-02-28 DIAGNOSIS — N058 Unspecified nephritic syndrome with other morphologic changes: Secondary | ICD-10-CM | POA: Diagnosis present

## 2014-02-28 DIAGNOSIS — IMO0002 Reserved for concepts with insufficient information to code with codable children: Secondary | ICD-10-CM | POA: Diagnosis present

## 2014-02-28 DIAGNOSIS — I4892 Unspecified atrial flutter: Secondary | ICD-10-CM

## 2014-02-28 DIAGNOSIS — R279 Unspecified lack of coordination: Secondary | ICD-10-CM | POA: Diagnosis not present

## 2014-02-28 DIAGNOSIS — I509 Heart failure, unspecified: Secondary | ICD-10-CM | POA: Diagnosis present

## 2014-02-28 DIAGNOSIS — G4730 Sleep apnea, unspecified: Secondary | ICD-10-CM | POA: Diagnosis present

## 2014-02-28 DIAGNOSIS — I059 Rheumatic mitral valve disease, unspecified: Secondary | ICD-10-CM | POA: Diagnosis not present

## 2014-02-28 DIAGNOSIS — R29810 Facial weakness: Secondary | ICD-10-CM | POA: Diagnosis present

## 2014-02-28 DIAGNOSIS — Z800 Family history of malignant neoplasm of digestive organs: Secondary | ICD-10-CM | POA: Diagnosis not present

## 2014-02-28 DIAGNOSIS — E1129 Type 2 diabetes mellitus with other diabetic kidney complication: Secondary | ICD-10-CM | POA: Diagnosis not present

## 2014-02-28 DIAGNOSIS — Z85048 Personal history of other malignant neoplasm of rectum, rectosigmoid junction, and anus: Secondary | ICD-10-CM

## 2014-02-28 DIAGNOSIS — Z9221 Personal history of antineoplastic chemotherapy: Secondary | ICD-10-CM | POA: Diagnosis not present

## 2014-02-28 DIAGNOSIS — Z7901 Long term (current) use of anticoagulants: Secondary | ICD-10-CM

## 2014-02-28 DIAGNOSIS — R29818 Other symptoms and signs involving the nervous system: Secondary | ICD-10-CM | POA: Diagnosis not present

## 2014-02-28 DIAGNOSIS — R269 Unspecified abnormalities of gait and mobility: Secondary | ICD-10-CM | POA: Diagnosis not present

## 2014-02-28 LAB — PROTIME-INR
INR: 1.43 (ref 0.00–1.49)
Prothrombin Time: 17.1 seconds — ABNORMAL HIGH (ref 11.6–15.2)

## 2014-02-28 LAB — CBC WITH DIFFERENTIAL/PLATELET
Basophils Absolute: 0 10*3/uL (ref 0.0–0.1)
Basophils Relative: 1 % (ref 0–1)
Eosinophils Absolute: 0.2 10*3/uL (ref 0.0–0.7)
Eosinophils Relative: 4 % (ref 0–5)
HCT: 38.2 % — ABNORMAL LOW (ref 39.0–52.0)
Hemoglobin: 12.1 g/dL — ABNORMAL LOW (ref 13.0–17.0)
LYMPHS ABS: 1.3 10*3/uL (ref 0.7–4.0)
Lymphocytes Relative: 23 % (ref 12–46)
MCH: 26.4 pg (ref 26.0–34.0)
MCHC: 31.7 g/dL (ref 30.0–36.0)
MCV: 83.2 fL (ref 78.0–100.0)
Monocytes Absolute: 0.5 10*3/uL (ref 0.1–1.0)
Monocytes Relative: 9 % (ref 3–12)
NEUTROS PCT: 63 % (ref 43–77)
Neutro Abs: 3.6 10*3/uL (ref 1.7–7.7)
Platelets: 164 10*3/uL (ref 150–400)
RBC: 4.59 MIL/uL (ref 4.22–5.81)
RDW: 15.8 % — ABNORMAL HIGH (ref 11.5–15.5)
WBC: 5.6 10*3/uL (ref 4.0–10.5)

## 2014-02-28 MED ORDER — SODIUM CHLORIDE 0.9 % IV SOLN
INTRAVENOUS | Status: DC
  Administered 2014-02-28: 1000 mL via INTRAVENOUS

## 2014-02-28 NOTE — ED Provider Notes (Signed)
CSN: [id number]     Arrival date & time 02/28/14  2053 History  This chart was scribed for Robert Kung, MD by Terressa Koyanagi, ED Scribe. This patient was seen in room APA14/APA14 and the patient's care was started at 10:29 PM.  PCP: Glo Herring., MD   No chief complaint on file.  The history is provided by the patient. No language interpreter was used.   HPI Comments: [name] is a 58 y.o. male, with DM; essential HTN, benign; MI; CHF; prostate cancer; chronic systolic heart failure, implanted dual ICD, who presents to the Emergency Department complaining of acute, transient  numbness in the left arm and left leg onset 8:00PM tonight. Pt also complains of associated transient slurred speech and left sided facial droop onset at 8:00PM tonight. Pt reports that the duration of the episode (numbness of left arm/leg; left sided facial droop; slurred speech) was approximately 10 minutes; thereafter, all of the symptoms subsided and pt was able to verbally communicate as usual. Pt also complains of associated constant headache onset 1 week ago. Pt states the headache is better in the morning and worsens as the day continues. Pt reports he does not currently have a headache.   Past Medical History  Diagnosis Date  . Essential hypertension, benign   . Type 2 diabetes mellitus   . Coronary atherosclerosis of native coronary artery     BMS to LAD 2001 at [facility], PTCA/atherectomy ramus and BMS to LAD 2009  . S/P colonoscopy     Normal via ostomy - September 2009  . Chronic systolic heart failure   . Myocardial infarction, anterior wall     Treated with tPA at [facility] 2000  . GERD (gastroesophageal reflux disease)   . Cardiomyopathy, ischemic     BIV ICD St. Jude, LVEF 23%  . Paroxysmal atrial fibrillation     Amiodarone and Coumadin  . 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 Prostate cancer   . 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 Transaminitis 12/12/2013   . CHF (congestive heart failure)    Past Surgical History  Procedure Laterality Date  . Internal defibrillator and pacemaker  2010    x2 St. Jude device  . Abdominal and perineal resection of rectum with total mesorectal excision      10/04/2007  . Colonoscopy      05/17/2007. IMPRESSION: Semilunar, apple-core neoplasm low in the rectum (palpable on digital rectal exam) beginning at 5 cm and corkscrewing up 5 cm in length. This was a low rectal lesion consistent with colorectal carcinoma. It was biosied multiple times. The upstream colon all the way to the cecum appeared normal. Recommendations: Followup on path. Surgical Consultation   . Colonoscopy  09/14/2011    Procedure: COLONOSCOPY;  Surgeon: Daneil Dolin, MD;  Location: [facility];  Service: Endoscopy;  Laterality: N/A;  8:30- TCS via colostomy & pt has defibrillator  . Colostomy     Family History  Problem Relation Age of Onset  . Colon cancer Mother 28  . Colon cancer Sister 78  . Coronary artery disease Father    History  Substance Use Topics  . Smoking status: Never Smoker   . Smokeless tobacco: Never Used  . Alcohol Use: No     Comment: Former user    Review of Systems  Constitutional: Negative for fever and chills.  HENT: Positive for rhinorrhea.   Eyes: Negative for visual disturbance.  Respiratory: Negative  for cough.   Cardiovascular: Negative for leg swelling.  Gastrointestinal: Negative for nausea, vomiting and diarrhea.  Genitourinary: Negative for dysuria and hematuria.  Musculoskeletal: Positive for neck pain (chronic). Negative for back pain.  Skin: Negative for rash.  Neurological: Positive for speech difficulty, numbness and headaches. Negative for dizziness.  Hematological: Bruises/bleeds easily.  Psychiatric/Behavioral: Negative for confusion.   Allergies  Review of patient's allergies indicates no known allergies.  Home Medications   Current Outpatient Rx  Name  Route  Sig  Dispense   Refill  . amiodarone (PACERONE) 200 MG tablet   Oral   Take 1 tablet (200 mg total) by mouth daily.   30 tablet   1   . aspirin 81 MG EC tablet   Oral   Take 81 mg by mouth at bedtime.          . citalopram (CELEXA) 20 MG tablet   Oral   Take 10 mg by mouth daily.         Marland Kitchen co-enzyme Q-10 50 MG capsule   Oral   Take 50 mg by mouth every morning.          . digoxin (LANOXIN) 0.125 MG tablet   Oral   Take 0.125 mg by mouth every other day.          . fluticasone (FLONASE) 50 MCG/ACT nasal spray   Each Nare   Place 1 spray into both nostrils daily as needed for allergies.          . furosemide (LASIX) 20 MG tablet   Oral   Take 20 mg by mouth daily.          . insulin aspart (NOVOLOG) 100 UNIT/ML injection   Subcutaneous   Inject 12-14 Units into the skin 3 (three) times daily as needed for high blood sugar. 14 units at breakfast and 12 units a lunch and 12 units at supper per sliding scale          . insulin glargine (LANTUS) 100 UNIT/ML injection   Subcutaneous   Inject 24 Units into the skin at bedtime.          . isosorbide mononitrate (IMDUR) 30 MG 24 hr tablet   Oral   Take 15 mg by mouth 2 (two) times daily.         . meclizine (ANTIVERT) 25 MG tablet   Oral   Take 1 tablet (25 mg total) by mouth 3 (three) times daily as needed for dizziness.   30 tablet   0   . metFORMIN (GLUCOPHAGE) 500 MG tablet   Oral   Take 500 mg by mouth 2 (two) times daily with a meal.         . metoprolol succinate (TOPROL-XL) 25 MG 24 hr tablet   Oral   Take 25 mg by mouth daily.         . niacin (NIASPAN) 1000 MG CR tablet   Oral   Take 1,000 mg by mouth at bedtime.           . Omega-3 Fatty Acids (FISH OIL) 1200 MG CAPS   Oral   Take 1,200 mg by mouth 2 (two) times daily.           . pantoprazole (PROTONIX) 40 MG tablet   Oral   Take 1 tablet (40 mg total) by mouth daily.   30 tablet   6   . ranolazine (RANEXA) 1000 MG SR tablet    Oral  Take 1,000 mg by mouth 2 (two) times daily.         . rosuvastatin (CRESTOR) 40 MG tablet   Oral   Take 40 mg by mouth at bedtime.         Marland Kitchen spironolactone (ALDACTONE) 25 MG tablet   Oral   Take 1 tablet (25 mg total) by mouth daily.   45 tablet   6   . warfarin (COUMADIN) 2.5 MG tablet   Oral   Take 2.5 mg by mouth daily at 6 PM.         . nitroGLYCERIN (NITROLINGUAL) 0.4 MG/SPRAY spray   Sublingual   Place 1 spray under the tongue every 5 (five) minutes as needed. angina          Triage Vitals: BP 103/72  Temp(Src) 98 F (36.7 C) (Oral)  Resp 16  Ht 5\' 10"  (1.778 m)  Wt 213 lb (96.616 kg)  BMI 30.56 kg/m2  SpO2 100% Physical Exam  Nursing note and vitals reviewed. Constitutional: He is oriented to person, place, and time. He appears well-developed and well-nourished. No distress.  HENT:  Head: Normocephalic and atraumatic.  Eyes: EOM are normal. No scleral icterus.  Neck: Neck supple. No tracheal deviation present.  Cardiovascular: Normal rate, regular rhythm and normal heart sounds.   Pulmonary/Chest: Effort normal. No respiratory distress.  Abdominal: Bowel sounds are normal. There is no tenderness.  Musculoskeletal: Normal range of motion.  Neurological: He is alert and oriented to person, place, and time. He displays normal reflexes. No cranial nerve deficit. Coordination normal.  Skin: Skin is warm and dry.  Psychiatric: He has a normal mood and affect. His behavior is normal.    ED Course  Procedures (including critical care time) DIAGNOSTIC STUDIES: Oxygen Saturation is 100% on RA, normal by my interpretation.    COORDINATION OF CARE: 10:48 PM-Discussed imaging results with pt. Discussed treatment plan which includes labs, possible admission with pt at bedside and pt agreed to plan.   Labs Review Labs Reviewed  CBC WITH DIFFERENTIAL - Abnormal; Notable for the following:    Hemoglobin 12.1 (*)    HCT 38.2 (*)    RDW 15.8 (*)    All  other components within normal limits  PROTIME-INR - Abnormal; Notable for the following:    Prothrombin Time 17.1 (*)    All other components within normal limits  COMPREHENSIVE METABOLIC PANEL  PRO B NATRIURETIC PEPTIDE   Results for orders placed during the hospital encounter of 02/28/14  CBC WITH DIFFERENTIAL      Result Value Ref Range   WBC 5.6  4.0 - 10.5 K/uL   RBC 4.59  4.22 - 5.81 MIL/uL   Hemoglobin 12.1 (*) 13.0 - 17.0 g/dL   HCT 38.2 (*) 39.0 - 52.0 %   MCV 83.2  78.0 - 100.0 fL   MCH 26.4  26.0 - 34.0 pg   MCHC 31.7  30.0 - 36.0 g/dL   RDW 15.8 (*) 11.5 - 15.5 %   Platelets 164  150 - 400 K/uL   Neutrophils Relative % 63  43 - 77 %   Neutro Abs 3.6  1.7 - 7.7 K/uL   Lymphocytes Relative 23  12 - 46 %   Lymphs Abs 1.3  0.7 - 4.0 K/uL   Monocytes Relative 9  3 - 12 %   Monocytes Absolute 0.5  0.1 - 1.0 K/uL   Eosinophils Relative 4  0 - 5 %   Eosinophils Absolute 0.2  0.0 - 0.7 K/uL   Basophils Relative 1  0 - 1 %   Basophils Absolute 0.0  0.0 - 0.1 K/uL  PROTIME-INR      Result Value Ref Range   Prothrombin Time 17.1 (*) 11.6 - 15.2 seconds   INR 1.43  0.00 - 1.49    Imaging Review Ct Head Wo Contrast  02/28/2014   CLINICAL DATA:  Transient slurred speech tonight.  EXAM: CT HEAD WITHOUT CONTRAST  TECHNIQUE: Contiguous axial images were obtained from the base of the skull through the vertex without intravenous contrast.  COMPARISON:  01/28/2014  FINDINGS: Normal appearing cerebral hemispheres and posterior fossa structures. Normal size and position of ventricles. No intracranial hemorrhage, mass lesion or CT evidence of acute infarction. Dense bilateral vertebral and internal carotid artery atheromatous calcifications. Unremarkable bones and included paranasal sinuses.  IMPRESSION: No acute abnormality.   Electronically Signed   By: Enrique Sack M.D.   On: 02/28/2014 22:27     EKG Interpretation   Date/Time:  Thursday February 28 2014 21:17:29 EDT Ventricular Rate:   85 PR Interval:    QRS Duration: 186 QT Interval:  480 QTC Calculation: 571 R Axis:   -93 Text Interpretation:  Undetermined rhythm Non-specific intra-ventricular  conduction block Right ventricular hypertrophy Inferior infarct , age  undetermined Anterolateral infarct , age undetermined Abnormal ECG When  compared with ECG of 20-Feb-2014 22:33, Current undetermined rhythm  precludes rhythm comparison, needs review suspect paced rhythm Confirmed  by Marque Bango  MD, Erlin Gardella [phone]) on 02/28/2014 11:48:26 PM      MDM   Final diagnoses:  TIA (transient ischemic attack)      patient symptoms very concerning for TIA. The patient was significant neurological deficit for about 10 minutes. Had numbness in left arm left leg and facial droop and slurred speech. Head CT without evidence of acute stroke or bleed. Patient not a candidate for MRI because he has a pacemaker defibrillator. Patient completely back to normal now symptoms only lasted 10 minutes. Patient has had headaches for the past week had migraines at a young age but none recently is possible this could be a migraine variant the with focal deficits disorder complicated migraine. Patient will require admission and observation.  I personally performed the services described in this documentation, which was scribed in my presence. The recorded information has been reviewed and is accurate.     Robert Kung, MD 02/28/14 [phone]

## 2014-02-28 NOTE — ED Notes (Signed)
Pt states he was at home alone when he had onset of slurred speech and states his "whole left side went numb"  Pt states he had weakness to that side as well.  Pt states his family came back home and wife reports she noticed the left side of his mouth was drooped.  Pt states these symptoms lasted approx 5 mins.

## 2014-02-28 NOTE — ED Notes (Signed)
States that around [employer], he lost use of his lt arm and lt leg. Speech was slurred at the time and wife states that pt's lt side of face was drooped. Pt has no symptoms now. States he has had a headache for about a week now. Rates pain as 6/10 in head.

## 2014-03-01 ENCOUNTER — Inpatient Hospital Stay (HOSPITAL_COMMUNITY)
Admit: 2014-03-01 | Discharge: 2014-03-01 | Disposition: A | Discharge status: Home / Self Care | Payer: Medicare Other | Attending: Internal Medicine | Admitting: Internal Medicine

## 2014-03-01 ENCOUNTER — Encounter (HOSPITAL_COMMUNITY): Payer: Self-pay | Admitting: Internal Medicine

## 2014-03-01 DIAGNOSIS — I5022 Chronic systolic (congestive) heart failure: Secondary | ICD-10-CM | POA: Diagnosis not present

## 2014-03-01 DIAGNOSIS — I059 Rheumatic mitral valve disease, unspecified: Secondary | ICD-10-CM | POA: Diagnosis not present

## 2014-03-01 DIAGNOSIS — I4891 Unspecified atrial fibrillation: Secondary | ICD-10-CM | POA: Diagnosis not present

## 2014-03-01 DIAGNOSIS — R42 Dizziness and giddiness: Secondary | ICD-10-CM | POA: Diagnosis not present

## 2014-03-01 DIAGNOSIS — G459 Transient cerebral ischemic attack, unspecified: Secondary | ICD-10-CM | POA: Diagnosis present

## 2014-03-01 DIAGNOSIS — R569 Unspecified convulsions: Secondary | ICD-10-CM | POA: Diagnosis not present

## 2014-03-01 DIAGNOSIS — R269 Unspecified abnormalities of gait and mobility: Secondary | ICD-10-CM | POA: Diagnosis not present

## 2014-03-01 DIAGNOSIS — E1129 Type 2 diabetes mellitus with other diabetic kidney complication: Secondary | ICD-10-CM | POA: Diagnosis not present

## 2014-03-01 DIAGNOSIS — I4892 Unspecified atrial flutter: Secondary | ICD-10-CM

## 2014-03-01 DIAGNOSIS — N189 Chronic kidney disease, unspecified: Secondary | ICD-10-CM

## 2014-03-01 DIAGNOSIS — E1165 Type 2 diabetes mellitus with hyperglycemia: Secondary | ICD-10-CM

## 2014-03-01 LAB — RAPID URINE DRUG SCREEN, HOSP PERFORMED
Amphetamines: NOT DETECTED
Barbiturates: NOT DETECTED
Benzodiazepines: NOT DETECTED
Cocaine: NOT DETECTED
OPIATES: NOT DETECTED
TETRAHYDROCANNABINOL: NOT DETECTED

## 2014-03-01 LAB — COMPREHENSIVE METABOLIC PANEL
ALBUMIN: 3 g/dL — AB (ref 3.5–5.2)
ALK PHOS: 101 U/L (ref 39–117)
ALT: 71 U/L — AB (ref 0–53)
ALT: 63 U/L — AB (ref 0–53)
AST: 66 U/L — ABNORMAL HIGH (ref 0–37)
AST: 62 U/L — AB (ref 0–37)
Albumin: 3.4 g/dL — ABNORMAL LOW (ref 3.5–5.2)
Alkaline Phosphatase: 84 U/L (ref 39–117)
BUN: 27 mg/dL — ABNORMAL HIGH (ref 6–23)
BUN: 25 mg/dL — ABNORMAL HIGH (ref 6–23)
CALCIUM: 8.7 mg/dL (ref 8.4–10.5)
CO2: 26 mEq/L (ref 19–32)
CO2: 24 meq/L (ref 19–32)
CREATININE: 1.56 mg/dL — AB (ref 0.50–1.35)
Calcium: 9.2 mg/dL (ref 8.4–10.5)
Chloride: 102 mEq/L (ref 96–112)
Chloride: 105 mEq/L (ref 96–112)
Creatinine, Ser: 1.67 mg/dL — ABNORMAL HIGH (ref 0.50–1.35)
GFR calc Af Amer: 54 mL/min — ABNORMAL LOW (ref >90)
GFR calc non Af Amer: 43 mL/min — ABNORMAL LOW (ref >90)
GFR calc non Af Amer: 47 mL/min — ABNORMAL LOW (ref >90)
GFR, EST AFRICAN AMERICAN: 50 mL/min — AB (ref >90)
GLUCOSE: 246 mg/dL — AB (ref 70–99)
Glucose, Bld: 249 mg/dL — ABNORMAL HIGH (ref 70–99)
POTASSIUM: 4.4 meq/L (ref 3.7–5.3)
Potassium: 4.2 mEq/L (ref 3.7–5.3)
SODIUM: 139 meq/L (ref 137–147)
Sodium: 139 mEq/L (ref 137–147)
TOTAL PROTEIN: 7.8 g/dL (ref 6.0–8.3)
TOTAL PROTEIN: 6.8 g/dL (ref 6.0–8.3)
Total Bilirubin: 0.5 mg/dL (ref 0.3–1.2)
Total Bilirubin: 0.4 mg/dL (ref 0.3–1.2)

## 2014-03-01 LAB — CBC
HCT: 37.1 % — ABNORMAL LOW (ref 39.0–52.0)
Hemoglobin: 11.6 g/dL — ABNORMAL LOW (ref 13.0–17.0)
MCH: 26 pg (ref 26.0–34.0)
MCHC: 31.3 g/dL (ref 30.0–36.0)
MCV: 83 fL (ref 78.0–100.0)
PLATELETS: 166 10*3/uL (ref 150–400)
RBC: 4.47 MIL/uL (ref 4.22–5.81)
RDW: 15.9 % — AB (ref 11.5–15.5)
WBC: 6.1 10*3/uL (ref 4.0–10.5)

## 2014-03-01 LAB — GLUCOSE, CAPILLARY
Glucose-Capillary: 250 mg/dL — ABNORMAL HIGH (ref 70–99)
Glucose-Capillary: 209 mg/dL — ABNORMAL HIGH (ref 70–99)

## 2014-03-01 LAB — HEMOGLOBIN A1C
Hgb A1c MFr Bld: 10.5 % — ABNORMAL HIGH (ref <5.7)
Mean Plasma Glucose: 255 mg/dL — ABNORMAL HIGH (ref <117)

## 2014-03-01 LAB — LIPID PANEL
CHOL/HDL RATIO: 3.4 ratio
Cholesterol: 115 mg/dL (ref 0–200)
HDL: 34 mg/dL — ABNORMAL LOW (ref >39)
LDL CALC: 51 mg/dL (ref 0–99)
Triglycerides: 149 mg/dL (ref <150)
VLDL: 30 mg/dL (ref 0–40)

## 2014-03-01 LAB — PRO B NATRIURETIC PEPTIDE: Pro B Natriuretic peptide (BNP): 1079 pg/mL — ABNORMAL HIGH (ref 0–125)

## 2014-03-01 MED ORDER — PANTOPRAZOLE SODIUM 40 MG PO TBEC
40.0000 mg | DELAYED_RELEASE_TABLET | Freq: Every day | ORAL | Status: DC
  Administered 2014-03-01: 40 mg via ORAL
  Filled 2014-03-01: qty 1

## 2014-03-01 MED ORDER — ACETAMINOPHEN 325 MG PO TABS: 650.0000 mg | ORAL_TABLET | ORAL | Status: DC | PRN

## 2014-03-01 MED ORDER — CITALOPRAM HYDROBROMIDE 20 MG PO TABS
10.0000 mg | ORAL_TABLET | Freq: Every day | ORAL | Status: DC
  Administered 2014-03-01: 10 mg via ORAL
  Filled 2014-03-01: qty 1

## 2014-03-01 MED ORDER — SODIUM CHLORIDE 0.9 % IV SOLN: 250.0000 mL | INTRAVENOUS | Status: DC | PRN

## 2014-03-01 MED ORDER — INSULIN GLARGINE 100 UNIT/ML ~~LOC~~ SOLN
24.0000 [IU] | Freq: Every day | SUBCUTANEOUS | Status: DC
  Administered 2014-03-01: 24 [IU] via SUBCUTANEOUS
  Filled 2014-03-01 (×4): qty 0.24

## 2014-03-01 MED ORDER — ATORVASTATIN CALCIUM 40 MG PO TABS
80.0000 mg | ORAL_TABLET | Freq: Every day | ORAL | Status: DC
  Administered 2014-03-01: 80 mg via ORAL
  Filled 2014-03-01: qty 2

## 2014-03-01 MED ORDER — INSULIN ASPART 100 UNIT/ML ~~LOC~~ SOLN
0.0000 [IU] | Freq: Three times a day (TID) | SUBCUTANEOUS | Status: DC
  Administered 2014-03-01 (×3): 3 [IU] via SUBCUTANEOUS

## 2014-03-01 MED ORDER — ISOSORBIDE MONONITRATE ER 30 MG PO TB24
15.0000 mg | ORAL_TABLET | Freq: Two times a day (BID) | ORAL | Status: DC
  Administered 2014-03-01 (×2): 15 mg via ORAL
  Filled 2014-03-01 (×2): qty 1

## 2014-03-01 MED ORDER — MECLIZINE HCL 12.5 MG PO TABS
25.0000 mg | ORAL_TABLET | Freq: Three times a day (TID) | ORAL | Status: DC
  Administered 2014-03-01 (×2): 25 mg via ORAL
  Filled 2014-03-01 (×2): qty 2

## 2014-03-01 MED ORDER — SODIUM CHLORIDE 0.9 % IJ SOLN
3.0000 mL | Freq: Two times a day (BID) | INTRAMUSCULAR | Status: DC
  Administered 2014-03-01: 3 mL via INTRAVENOUS

## 2014-03-01 MED ORDER — AMIODARONE HCL 200 MG PO TABS
200.0000 mg | ORAL_TABLET | Freq: Every day | ORAL | Status: DC
  Administered 2014-03-01: 200 mg via ORAL
  Filled 2014-03-01: qty 1

## 2014-03-01 MED ORDER — WARFARIN SODIUM 5 MG PO TABS
5.0000 mg | ORAL_TABLET | Freq: Once | ORAL | Status: AC
  Administered 2014-03-01: 5 mg via ORAL
  Filled 2014-03-01: qty 1

## 2014-03-01 MED ORDER — RANOLAZINE ER 500 MG PO TB12
ORAL_TABLET | ORAL | Status: AC
  Filled 2014-03-01: qty 2

## 2014-03-01 MED ORDER — SODIUM CHLORIDE 0.9 % IJ SOLN: 3.0000 mL | INTRAMUSCULAR | Status: DC | PRN

## 2014-03-01 MED ORDER — ASPIRIN EC 81 MG PO TBEC
81.0000 mg | DELAYED_RELEASE_TABLET | Freq: Every day | ORAL | Status: DC
  Administered 2014-03-01: 81 mg via ORAL
  Filled 2014-03-01 (×2): qty 1

## 2014-03-01 MED ORDER — RANOLAZINE ER 500 MG PO TB12
1000.0000 mg | ORAL_TABLET | Freq: Two times a day (BID) | ORAL | Status: DC
  Administered 2014-03-01 (×2): 1000 mg via ORAL
  Filled 2014-03-01 (×9): qty 2

## 2014-03-01 MED ORDER — FUROSEMIDE 20 MG PO TABS
20.0000 mg | ORAL_TABLET | Freq: Every day | ORAL | Status: DC
  Administered 2014-03-01: 20 mg via ORAL
  Filled 2014-03-01: qty 1

## 2014-03-01 MED ORDER — WARFARIN - PHARMACIST DOSING INPATIENT: Status: DC

## 2014-03-01 MED ORDER — RIVAROXABAN 20 MG PO TABS: 20.0000 mg | ORAL_TABLET | Freq: Every day | ORAL | Status: DC

## 2014-03-01 MED ORDER — DIGOXIN 125 MCG PO TABS
0.1250 mg | ORAL_TABLET | ORAL | Status: DC
  Administered 2014-03-01: 0.125 mg via ORAL
  Filled 2014-03-01: qty 1

## 2014-03-01 NOTE — Evaluation (Signed)
Physical Therapy Evaluation Patient Details Name: [name] MRN: [id number] DOB: October 25, 1956 Today's Date: 03/01/2014   History of Present Illness  Pt with a hx of rectal CA, Afib, CHF, CAD and DM was admitted after a brief episode of numbness in the left extremeties and slurred speech.  He reports that he has been having intermittent dizziness with falls and was hospitalized last week for this.  He states that his dizziness has subsided since the initiation of Meclizine and he is feeling the best ever since the dizziness began last spring.  Clinical Impression   Pt was seen for an evaluation.  He has no abnormalities seen regarding strength, balance, coordination.  He had no dizziness positionally or with gait.    Follow Up Recommendations No PT follow up    Equipment Recommendations  None recommended by PT    Recommendations for Other Services  none     Precautions / Restrictions Precautions Precautions: None Restrictions Weight Bearing Restrictions: No      Mobility  Bed Mobility Overal bed mobility: Independent                Transfers Overall transfer level: Independent                  Ambulation/Gait Ambulation/Gait assistance: Independent Ambulation Distance (Feet): 200 Feet Assistive device: None Gait Pattern/deviations: WFL(Within Functional Limits)   Gait velocity interpretation: at or above normal speed for age/gender                       Balance Overall balance assessment: Independent                                     Home Living Family/patient expects to be discharged to:: Private residence Living Arrangements: Spouse/significant other Available Help at Discharge: Family;Available 24 hours/day Type of Home: House Home Access: Level entry     Home Layout: One level Home Equipment: None      Prior Function Level of Independence: Independent                       Extremity/Trunk  Assessment               Lower Extremity Assessment: Overall WFL for tasks assessed         Communication   Communication: No difficulties  Cognition Arousal/Alertness: Awake/alert Behavior During Therapy: WFL for tasks assessed/performed Overall Cognitive Status: Within Functional Limits for tasks assessed                                    Assessment/Plan    PT Assessment Patent does not need any further PT services  PT Diagnosis     PT Problem List    PT Treatment Interventions     PT Goals (Current goals can be found in the Care Plan section) Acute Rehab PT Goals PT Goal Formulation: No goals set, d/c therapy                 End of Session Equipment Utilized During Treatment: Gait belt Activity Tolerance: Patient tolerated treatment well Patient left: in bed         Time: 6734-1937 PT Time Calculation (min): 18 min   Charges:   PT Evaluation $Initial PT Evaluation Tier I: 1  Procedure     PT G Codes:          [name] 03/01/2014, 9:04 AM

## 2014-03-01 NOTE — Progress Notes (Signed)
EEG Completed; Results Pending  

## 2014-03-01 NOTE — Discharge Summary (Signed)
Physician Discharge Summary  [name] XBM:[id number] DOB: 1956-01-31 DOA: 02/28/2014  PCP: Cassell Smiles., MD  Admit date: 02/28/2014 Discharge date: 03/01/2014  Time spent: 45 minutes  Recommendations for Outpatient Follow-up:  1. Patient will be referred back to the cardiology clinic to follow up with Dr. Wyline Mood for lower ejection fraction on echocardiogram. 2. Followup primary care physician in one to 2 weeks  Discharge Diagnoses:  Principal Problem:   TIA (transient ischemic attack) Active Problems:   ADENOCARCINOMA, RECTUM radiation/ chemo/ surg 2008   CARDIOMYOPATHY, ISCHEMIC, with BiV ICD, st Jude EF 23%   PAF (paroxysmal atrial fibrillation)   Long term (current) use of anticoagulants    CKD III   DM type 2, uncontrolled, with renal complications  chronic systolic congestive heart failure, ejection fraction of 15-20%  Discharge Condition: Improved  Diet recommendation: Low salt, low carb  Filed Weights   02/28/14 2122 03/01/14 0200  Weight: 96.616 kg (213 lb) 99 kg (218 lb 4.1 oz)    History of present illness:  [name] is a 58 y.o. male with a past medical history of paroxysmal atrial fibrillation, chronic systolic congestive heart failure, coronary artery disease, diabetes, who has been having dizziness for the last week or so. He was taken to [facility] last Wednesday, and was diagnosed with vertigo. He was started on meclizine and his dizziness is much better. As a result of first vertigo he has had many falls. But he is been feeling much better in the last 3-4 days. And, then about 8 PM tonight he started noticing that his left leg and his left arm went numb. He couldn't do purposeful movements with these limbs. His wife noticed that he had a left-sided facial droop, and he also had slurred speech. The symptoms lasted 10 minutes and then they resolved completely. He tells me that last for the past 1 week he's had a headache, which is located all  over the head, which usually starts in the morning and then sometimes resolves by afternoon. Denies any headache currently. Denies any fever, chills. No seizure-type activity. No urinary complaints. About a month ago, his dose of metoprolol was decreased due to hypotension. Denies any other medication changes recently. Currently, he feels like he is back to his baseline in terms of his motor strength.   Hospital Course:  This patient was admitted to the hospital with transient left arm and left leg numbness. There was questionable left-sided facial droop. He reports his symptoms lasted for approximately 10 minutes and then resolving spontaneously. He was evaluated in the emergency room did not receive TPA due to resolution of symptoms as well as being anticoagulated. He was admitted to the hospital for further evaluation and treatment of transient ischemic attack. Since the patient has ICD/pacer, he could not undergo MRI. He did undergo CT scan which did not show any acute infarct. He was seen by neurology who recommended changing the patient from warfarin to Xarelto for atrial fibrillation since the patient's INR was subtherapeutic on admission. We have continued on aspirin due to his history of congestive heart failure and coronary artery disease. Echocardiogram was done which showed a decline in ejection fraction from 30-35% in 08/2013 to 15 to 20%. The patient did not have any chest pain or signs of volume overload. We have left a message with his primary cardiologist to schedule followup next week to readdress his cardiac medications. LDL was checked and found to be less than 100. A1c was  elevated at 10.5. His diabetic regimen will need to be further adjusted in the outpatient setting for better control. At time of discharge, patient was feeling well and he did not have any complaints.  Procedures:  EEG, results currently pending Echocardiogram: - Procedure narrative: Transthoracic echocardiography.  Image quality was suboptimal. The study was technically difficult, as a result of poor sound wave transmission. - Left ventricle: Systolic function is severely reduced, estimated EF 15-20%. Severe diffuse hypokinesis is noted. The cavity size was moderately dilated. Wall thickness was increased in a pattern of moderate LVH. Diastolic dysfunction is seen, indeterminate grade. The apex was poorly visualized. Doppler parameters are consistent with high ventricular filling pressure. - Ventricular septum: Septal motion showed abnormal function and dyssynergy. These changes are consistent with right ventricular pacing. - Aortic valve: Trileaflet; mildly thickened leaflets. There was no stenosis. - Mitral valve: Mildly thickened leaflets . Mild tethering of leaflet motion due to severe left ventricular dysfunction. Mild regurgitation. - Left atrium: The atrium was mildly to moderately dilated. - Right ventricle: The cavity size was normal. Wall thickness was normal. Pacer wire or catheter noted in right ventricle. Systolic function was mildly to moderately reduced. - Right atrium: Pacer wire or catheter noted in right atrium. - Atrial septum: The septum bowed from left to right, consistent with increased left atrial pressure. - Tricuspid valve: Mild regurgitation. - Pulmonary arteries: PA peak pressure: 42mm Hg (S). Mildly elevated pulmonary pressures.    Consultations:  Neurology  Discharge Exam: Filed Vitals:   03/01/14 0645  BP: 90/52  Pulse:   Temp:   Resp:     General: No acute distress Cardiovascular: S1, S2, regular rate and rhythm Respiratory: Clear to auscultation bilaterally  Discharge Instructions  Discharge Orders   Future Appointments Provider Department Dept Phone   03/06/2014 2:00 PM Nira Retort, NP [facility] [phone]   05/17/2014 1:20 PM [facility] [place] [phone]   05/21/2014 8:20 AM  Antoine Poche, MD [facility] [place] [phone]   08/30/2014 9:45 AM [facility] [phone]   08/30/2014 10:15 AM Ladene Artist, MD [facility] [phone]   Future Orders Complete By Expires   Diet - low sodium heart healthy  As directed    Increase activity slowly  As directed        Medication List    STOP taking these medications       warfarin 2.5 MG tablet  Commonly known as:  COUMADIN      TAKE these medications       amiodarone 200 MG tablet  Commonly known as:  PACERONE  Take 1 tablet (200 mg total) by mouth daily.     aspirin 81 MG EC tablet  Take 81 mg by mouth at bedtime.     citalopram 20 MG tablet  Commonly known as:  CELEXA  Take 10 mg by mouth daily.     co-enzyme Q-10 50 MG capsule  Take 50 mg by mouth every morning.     digoxin 0.125 MG tablet  Commonly known as:  LANOXIN  Take 0.125 mg by mouth every other day.     Fish Oil 1200 MG Caps  Take 1,200 mg by mouth 2 (two) times daily.     fluticasone 50 MCG/ACT nasal spray  Commonly known as:  FLONASE  Place 1 spray into both nostrils daily as needed for allergies.     furosemide  20 MG tablet  Commonly known as:  LASIX  Take 20 mg by mouth daily.     insulin aspart 100 UNIT/ML injection  Commonly known as:  novoLOG  - Inject 12-14 Units into the skin 3 (three) times daily as needed for high blood sugar. 14 units at breakfast and 12 units a lunch and 12 units at supper per sliding scale  -      insulin glargine 100 UNIT/ML injection  Commonly known as:  LANTUS  Inject 24 Units into the skin at bedtime.     isosorbide mononitrate 30 MG 24 hr tablet  Commonly known as:  IMDUR  Take 15 mg by mouth 2 (two) times daily.     meclizine 25 MG tablet  Commonly known as:  ANTIVERT  Take 1 tablet (25 mg total) by mouth 3 (three) times daily as needed for dizziness.     metFORMIN 500 MG tablet  Commonly  known as:  GLUCOPHAGE  Take 500 mg by mouth 2 (two) times daily with a meal.     metoprolol succinate 25 MG 24 hr tablet  Commonly known as:  TOPROL-XL  Take 25 mg by mouth daily.     niacin 1000 MG CR tablet  Commonly known as:  NIASPAN  Take 1,000 mg by mouth at bedtime.     nitroGLYCERIN 0.4 MG/SPRAY spray  Commonly known as:  NITROLINGUAL  Place 1 spray under the tongue every 5 (five) minutes as needed. angina     pantoprazole 40 MG tablet  Commonly known as:  PROTONIX  Take 1 tablet (40 mg total) by mouth daily.     ranolazine 1000 MG SR tablet  Commonly known as:  RANEXA  Take 1,000 mg by mouth 2 (two) times daily.     Rivaroxaban 20 MG Tabs tablet  Commonly known as:  XARELTO  Take 1 tablet (20 mg total) by mouth daily with supper.     rosuvastatin 40 MG tablet  Commonly known as:  CRESTOR  Take 40 mg by mouth at bedtime.     spironolactone 25 MG tablet  Commonly known as:  ALDACTONE  Take 1 tablet (25 mg total) by mouth daily.       No Known Allergies     Follow-up Information   Follow up with Cassell Smiles., MD. Schedule an appointment as soon as possible for a visit in 2 weeks.   Specialty:  Internal Medicine   Contact information:   [address] [place] [address] [phone]        The results of significant diagnostics from this hospitalization (including imaging, microbiology, ancillary and laboratory) are listed below for reference.    Significant Diagnostic Studies: Ct Head Wo Contrast  02/28/2014   CLINICAL DATA:  Transient slurred speech tonight.  EXAM: CT HEAD WITHOUT CONTRAST  TECHNIQUE: Contiguous axial images were obtained from the base of the skull through the vertex without intravenous contrast.  COMPARISON:  01/28/2014  FINDINGS: Normal appearing cerebral hemispheres and posterior fossa structures. Normal size and position of ventricles. No intracranial hemorrhage, mass lesion or CT evidence of acute  infarction. Dense bilateral vertebral and internal carotid artery atheromatous calcifications. Unremarkable bones and included paranasal sinuses.  IMPRESSION: No acute abnormality.   Electronically Signed   By: Gordan Payment M.D.   On: 02/28/2014 22:27   US Abdomen Complete  02/21/2014   CLINICAL DATA:  Nausea.  EXAM: ULTRASOUND ABDOMEN COMPLETE  COMPARISON:  CT ABD/PELVIS W CM dated  05/26/2010; SP BIOPSY CORE PROSTATE dated 06/27/2007  FINDINGS: Gallbladder:  5 mm echogenic focus without shadowing is noted in the gallbladder. This could represent nonshadowing stone or polyp. This was non mobile. Thickening of the gallbladder wall to 5.1 mm noted. Acute or chronic cholecystitis cannot be excluded. Ultrasound Murphy's sign is negative. No pericholecystic fluid collections are present.  Common bile duct:  Diameter: 6 mm.  Liver:  Echogenic consistent with fatty infiltration and/or hepatocellular disease.  IVC:  No abnormality visualized.  Pancreas:  Visualized portion unremarkable.  Spleen:  Size and appearance within normal limits.  Right Kidney:  Length: 9.9 cm. Echogenicity within normal limits. No mass or hydronephrosis visualized.  Left Kidney:  Length: 9.2 cm. Echogenicity within normal limits. No mass or hydronephrosis visualized.  Abdominal aorta:  No aneurysm visualized.  Other findings:  None.  IMPRESSION: 1. Echogenic non mobile focus in the gallbladder. This could represent a nonshadowing stone or polyp. 2. Thickened gallbladder wall. Cholecystitis cannot be excluded. Ultrasound Murphy sign is negative. No pericholecystic fluid .   Electronically Signed   By: Maisie Fus  Register   On: 02/21/2014 00:58   Dg Chest Port 1 View  03/01/2014   CLINICAL DATA:  Left-sided weakness.  EXAM: PORTABLE CHEST - 1 VIEW  COMPARISON:  Chest x-ray 01/26/2014.  FINDINGS: Lung volumes are normal. No consolidative airspace disease. No pleural effusions. No evidence of pulmonary edema. Heart size is mildly enlarged (unchanged).  Left-sided biventricular pacemaker/ AICD with lead tips projecting over the expected location of the right atrium, anterior wall of the left ventricle via the coronary sinus and coronary veins, and the right ventricle.  IMPRESSION: 1. No radiographic evidence of acute cardiopulmonary disease. 2. Mild cardiomegaly.   Electronically Signed   By: Trudie Reed M.D.   On: 03/01/2014 00:21    Microbiology: No results found for this or any previous visit (from the past 240 hour(s)).   Labs: Basic Metabolic Panel:  Recent Labs Lab 02/28/14 2309 03/01/14 0508  NA 139 139  K 4.4 4.2  CL 102 105  CO2 26 24  GLUCOSE 246* 249*  BUN 27* 25*  CREATININE 1.67* 1.56*  CALCIUM 9.2 8.7   Liver Function Tests:  Recent Labs Lab 02/28/14 2309 03/01/14 0508  AST 66* 62*  ALT 71* 63*  ALKPHOS 101 84  BILITOT 0.5 0.4  PROT 7.8 6.8  ALBUMIN 3.4* 3.0*   No results found for this basename: LIPASE, AMYLASE,  in the last 168 hours No results found for this basename: AMMONIA,  in the last 168 hours CBC:  Recent Labs Lab 02/28/14 2309 03/01/14 0508  WBC 5.6 6.1  NEUTROABS 3.6  --   HGB 12.1* 11.6*  HCT 38.2* 37.1*  MCV 83.2 83.0  PLT 164 166   Cardiac Enzymes: No results found for this basename: CKTOTAL, CKMB, CKMBINDEX, TROPONINI,  in the last 168 hours BNP: BNP (last 3 results)  Recent Labs  12/11/13 1140 01/26/14 2128 02/28/14 2309  PROBNP 3985.0* 2348.0* 1079.0*   CBG:  Recent Labs Lab 03/01/14 0734 03/01/14 1153  GLUCAP 250* 209*       Signed:  Carlisa Eble  [facility] 03/01/2014, 6:52 PM

## 2014-03-01 NOTE — H&P (Signed)
[facility] History and Physical  [name] KGM:[id number] DOB: 1956-08-29 DOA: 02/28/2014   PCP: Glo Herring., MD  Specialists: His cardiologist is Dr. Harl Bowie. He is followed by Dr. Benay Spice with oncology  Chief Complaint: Numbness on the left side  HPI: [name] is a 58 y.o. male with a past medical history of paroxysmal atrial fibrillation, chronic systolic congestive heart failure, coronary artery disease, diabetes, who has been having dizziness for the last week or so. He was taken to [facility] last Wednesday, and was diagnosed with vertigo. He was started on meclizine and his dizziness is much better. As a result of first vertigo he has had many falls. But he is been feeling much better in the last 3-4 days. And, then about 8 PM tonight he started noticing that his left leg and his left arm went numb. He couldn't do purposeful movements with these limbs. His wife noticed that he had a left-sided facial droop, and he also had slurred speech. The symptoms lasted 10 minutes and then they resolved completely. He tells me that last for the past 1 week he's had a headache, which is located all over the head, which usually starts in the morning and then sometimes resolves by afternoon. Denies any headache currently. Denies any fever, chills. No seizure-type activity. No urinary complaints. About a month ago, his dose of metoprolol was decreased due to hypotension. Denies any other medication changes recently. Currently, he feels like he is back to his baseline in terms of his motor strength.  Home Medications: Prior to Admission medications   Medication Sig Start Date End Date Taking? Authorizing Provider  amiodarone (PACERONE) 200 MG tablet Take 1 tablet (200 mg total) by mouth daily. 02/11/14  Yes Evans Lance, MD  aspirin 81 MG EC tablet Take 81 mg by mouth at bedtime.    Yes Historical Provider, MD  citalopram (CELEXA) 20 MG tablet Take 10 mg by mouth daily.  11/22/13  Yes Lorretta Harp, MD  co-enzyme Q-10 50 MG capsule Take 50 mg by mouth every morning.    Yes Historical Provider, MD  digoxin (LANOXIN) 0.125 MG tablet Take 0.125 mg by mouth every other day.    Yes Historical Provider, MD  fluticasone (FLONASE) 50 MCG/ACT nasal spray Place 1 spray into both nostrils daily as needed for allergies.  11/21/13  Yes Historical Provider, MD  furosemide (LASIX) 20 MG tablet Take 20 mg by mouth daily.  12/13/13  Yes Lezlie Octave Black, NP  insulin aspart (NOVOLOG) 100 UNIT/ML injection Inject 12-14 Units into the skin 3 (three) times daily as needed for high blood sugar. 14 units at breakfast and 12 units a lunch and 12 units at supper per sliding scale    Yes Historical Provider, MD  insulin glargine (LANTUS) 100 UNIT/ML injection Inject 24 Units into the skin at bedtime.    Yes Historical Provider, MD  isosorbide mononitrate (IMDUR) 30 MG 24 hr tablet Take 15 mg by mouth 2 (two) times daily.   Yes Historical Provider, MD  meclizine (ANTIVERT) 25 MG tablet Take 1 tablet (25 mg total) by mouth 3 (three) times daily as needed for dizziness. 6/44/03  Yes Delora Fuel, MD  metFORMIN (GLUCOPHAGE) 500 MG tablet Take 500 mg by mouth 2 (two) times daily with a meal.   Yes Historical Provider, MD  metoprolol succinate (TOPROL-XL) 25 MG 24 hr tablet Take 25 mg by mouth daily.   Yes Historical Provider, MD  niacin (NIASPAN)  1000 MG CR tablet Take 1,000 mg by mouth at bedtime.     Yes Historical Provider, MD  Omega-3 Fatty Acids (FISH OIL) 1200 MG CAPS Take 1,200 mg by mouth 2 (two) times daily.     Yes Historical Provider, MD  pantoprazole (PROTONIX) 40 MG tablet Take 1 tablet (40 mg total) by mouth daily. 11/20/13  Yes Lorretta Harp, MD  ranolazine (RANEXA) 1000 MG SR tablet Take 1,000 mg by mouth 2 (two) times daily.   Yes Historical Provider, MD  rosuvastatin (CRESTOR) 40 MG tablet Take 40 mg by mouth at bedtime. 01/18/14  Yes Arnoldo Lenis, MD  spironolactone  (ALDACTONE) 25 MG tablet Take 1 tablet (25 mg total) by mouth daily. 12/17/13  Yes Mihai Croitoru, MD  warfarin (COUMADIN) 2.5 MG tablet Take 2.5 mg by mouth daily at 6 PM. 01/10/14  Yes Arnoldo Lenis, MD  nitroGLYCERIN (NITROLINGUAL) 0.4 MG/SPRAY spray Place 1 spray under the tongue every 5 (five) minutes as needed. angina    Historical Provider, MD    Allergies: No Known Allergies  Past Medical History: Past Medical History  Diagnosis Date  . Essential hypertension, benign   . Type 2 diabetes mellitus   . Coronary atherosclerosis of native coronary artery     BMS to LAD 2001 at [facility], PTCA/atherectomy ramus and BMS to LAD 2009  . S/P colonoscopy     Normal via ostomy - September 2009  . Chronic systolic heart failure   . Myocardial infarction, anterior wall     Treated with tPA at [facility] 2000  . GERD (gastroesophageal reflux disease)   . Cardiomyopathy, ischemic     BIV ICD St. Jude, LVEF 23%  . Paroxysmal atrial fibrillation     Amiodarone and Coumadin  . 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 Prostate cancer   . 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 Transaminitis 12/12/2013  . CHF (congestive heart failure)     Past Surgical History  Procedure Laterality Date  . Internal defibrillator and pacemaker  2010    x2 St. Jude device  . Abdominal and perineal resection of rectum with total mesorectal excision      10/04/2007  . Colonoscopy      05/17/2007. IMPRESSION: Semilunar, apple-core neoplasm low in the rectum (palpable on digital rectal exam) beginning at 5 cm and corkscrewing up 5 cm in length. This was a low rectal lesion consistent with colorectal carcinoma. It was biosied multiple times. The upstream colon all the way to the cecum appeared normal. Recommendations: Followup on path. Surgical Consultation   . Colonoscopy  09/14/2011    Procedure: COLONOSCOPY;  Surgeon: Daneil Dolin, MD;  Location: [facility];  Service: Endoscopy;   Laterality: N/A;  8:30- TCS via colostomy & pt has defibrillator  . Colostomy      Social History: Patient lives outside of [place] with his wife. Denies smoking, alcohol or illicit drug use. Usually independent with daily activities.  Family History:  Family History  Problem Relation Age of Onset  . Colon cancer Mother 20  . Colon cancer Sister 49  . Coronary artery disease Father      Review of Systems - History obtained from the patient General ROS: positive for  - fatigue Psychological ROS: negative Ophthalmic ROS: negative ENT ROS: negative Allergy and Immunology ROS: negative Hematological and Lymphatic ROS: negative Endocrine ROS: negative Respiratory ROS: no cough, shortness of breath, or wheezing Cardiovascular ROS: no  chest pain or dyspnea on exertion Gastrointestinal ROS: no abdominal pain, change in bowel habits, or black or bloody stools Genito-Urinary ROS: no dysuria, trouble voiding, or hematuria Musculoskeletal ROS: negative Neurological ROS: as in hpi Dermatological ROS: negative  Physical Examination  Filed Vitals:   02/28/14 2122 02/28/14 2332  BP: 103/72 99/77  Pulse:  81  Temp: 98 F (36.7 C)   TempSrc: Oral   Resp: 16 12  Height: $Remove'5\' 10"'SDpOvLC$  (1.778 m)   Weight: 96.616 kg (213 lb)   SpO2: 100% 100%    BP 99/77  Pulse 81  Temp(Src) 98 F (36.7 C) (Oral)  Resp 12  Ht $R'5\' 10"'yL$  (1.778 m)  Wt 96.616 kg (213 lb)  BMI 30.56 kg/m2  SpO2 100%  General appearance: alert, cooperative, appears stated age and no distress Head: Normocephalic, without obvious abnormality, atraumatic Eyes: conjunctivae/corneas clear. PERRL, EOM's intact. Throat: lips, mucosa, and tongue normal; teeth and gums normal Neck: no adenopathy, no carotid bruit, no JVD, supple, symmetrical, trachea midline and thyroid not enlarged, symmetric, no tenderness/mass/nodules Resp: clear to auscultation bilaterally Cardio: S1-S2 is irregularly irregular. No S3, S4. No rubs, murmurs, or  bruit. No pedal edema. GI: soft, non-tender; bowel sounds normal; no masses,  no organomegaly. Colostomy is noted. Extremities: extremities normal, atraumatic, no cyanosis or edema Pulses: 2+ and symmetric Skin: Skin color, texture, turgor normal. No rashes or lesions Lymph nodes: Cervical, supraclavicular, and axillary nodes normal. Neurologic: He is alert and oriented x3. No cranial nerve deficits appreciated at this time. Motor strength is equal, bilateral upper and lower extremity 5 out of 5. No pronator drift. No dysdiadochokinesis. Gait was not assessed. Reflexes equal bilaterally. No sensory deficits appreciated.  Laboratory Data: Results for orders placed during the hospital encounter of 02/28/14 (from the past 48 hour(s))  CBC WITH DIFFERENTIAL     Status: Abnormal   Collection Time    02/28/14 11:09 PM      Result Value Ref Range   WBC 5.6  4.0 - 10.5 K/uL   RBC 4.59  4.22 - 5.81 MIL/uL   Hemoglobin 12.1 (*) 13.0 - 17.0 g/dL   HCT 38.2 (*) 39.0 - 52.0 %   MCV 83.2  78.0 - 100.0 fL   MCH 26.4  26.0 - 34.0 pg   MCHC 31.7  30.0 - 36.0 g/dL   RDW 15.8 (*) 11.5 - 15.5 %   Platelets 164  150 - 400 K/uL   Neutrophils Relative % 63  43 - 77 %   Neutro Abs 3.6  1.7 - 7.7 K/uL   Lymphocytes Relative 23  12 - 46 %   Lymphs Abs 1.3  0.7 - 4.0 K/uL   Monocytes Relative 9  3 - 12 %   Monocytes Absolute 0.5  0.1 - 1.0 K/uL   Eosinophils Relative 4  0 - 5 %   Eosinophils Absolute 0.2  0.0 - 0.7 K/uL   Basophils Relative 1  0 - 1 %   Basophils Absolute 0.0  0.0 - 0.1 K/uL  COMPREHENSIVE METABOLIC PANEL     Status: Abnormal   Collection Time    02/28/14 11:09 PM      Result Value Ref Range   Sodium 139  137 - 147 mEq/L   Potassium 4.4  3.7 - 5.3 mEq/L   Chloride 102  96 - 112 mEq/L   CO2 26  19 - 32 mEq/L   Glucose, Bld 246 (*) 70 - 99 mg/dL   BUN 27 (*)  6 - 23 mg/dL   Creatinine, Ser 1.67 (*) 0.50 - 1.35 mg/dL   Calcium 9.2  8.4 - 10.5 mg/dL   Total Protein 7.8  6.0 - 8.3 g/dL     Albumin 3.4 (*) 3.5 - 5.2 g/dL   AST 66 (*) 0 - 37 U/L   ALT 71 (*) 0 - 53 U/L   Alkaline Phosphatase 101  39 - 117 U/L   Total Bilirubin 0.5  0.3 - 1.2 mg/dL   GFR calc non Af Amer 43 (*) >90 mL/min   GFR calc Af Amer 50 (*) >90 mL/min   Comment: (NOTE)     The eGFR has been calculated using the CKD EPI equation.     This calculation has not been validated in all clinical situations.     eGFR's persistently <90 mL/min signify possible Chronic Kidney     Disease.  PRO B NATRIURETIC PEPTIDE     Status: Abnormal   Collection Time    02/28/14 11:09 PM      Result Value Ref Range   Pro B Natriuretic peptide (BNP) 1079.0 (*) 0 - 125 pg/mL  PROTIME-INR     Status: Abnormal   Collection Time    02/28/14 11:09 PM      Result Value Ref Range   Prothrombin Time 17.1 (*) 11.6 - 15.2 seconds   INR 1.43  0.00 - 1.49    Radiology Reports: Ct Head Wo Contrast  02/28/2014   CLINICAL DATA:  Transient slurred speech tonight.  EXAM: CT HEAD WITHOUT CONTRAST  TECHNIQUE: Contiguous axial images were obtained from the base of the skull through the vertex without intravenous contrast.  COMPARISON:  01/28/2014  FINDINGS: Normal appearing cerebral hemispheres and posterior fossa structures. Normal size and position of ventricles. No intracranial hemorrhage, mass lesion or CT evidence of acute infarction. Dense bilateral vertebral and internal carotid artery atheromatous calcifications. Unremarkable bones and included paranasal sinuses.  IMPRESSION: No acute abnormality.   Electronically Signed   By: Enrique Sack M.D.   On: 02/28/2014 22:27   Dg Chest Port 1 View  03/01/2014   CLINICAL DATA:  Left-sided weakness.  EXAM: PORTABLE CHEST - 1 VIEW  COMPARISON:  Chest x-ray 01/26/2014.  FINDINGS: Lung volumes are normal. No consolidative airspace disease. No pleural effusions. No evidence of pulmonary edema. Heart size is mildly enlarged (unchanged). Left-sided biventricular pacemaker/ AICD with lead tips  projecting over the expected location of the right atrium, anterior wall of the left ventricle via the coronary sinus and coronary veins, and the right ventricle.  IMPRESSION: 1. No radiographic evidence of acute cardiopulmonary disease. 2. Mild cardiomegaly.   Electronically Signed   By: Vinnie Langton M.D.   On: 03/01/2014 00:21    Electrocardiogram: Ventricular paced rhythm  Problem List  Principal Problem:   TIA (transient ischemic attack) Active Problems:   ADENOCARCINOMA, RECTUM radiation/ chemo/ surg 2008   CARDIOMYOPATHY, ISCHEMIC, with BiV ICD, st Jude EF 23%   PAF (paroxysmal atrial fibrillation)   Long term (current) use of anticoagulants    CKD III   DM type 2, uncontrolled, with renal complications   Assessment: This is a 58 year old, Caucasian male, who presents with left-sided numbness with slurred speech and facial droop. The symptoms resolved in 10 minutes. This most likely was a TIA. He has risk factors for neurovascular disease with a history of hypertension, diabetes, atrial fibrillation. His INR is subtherapeutic. Complex migraine headache is also in the differential. Seizure activity is unlikely.  Plan: #1 TIA: He'll be admitted to the hospital for further workup. Due to his defibrillator and pacemaker he cannot undergo MRI. He underwent carotid Doppler study recently which did not show any significant stenoses. His last echocardiogram was in September so, we will repeat one. We will continue with his aspirin and warfarin. Consult neurology to see him. Lipid panel will be checked in the morning. PT and OT will be consulted. Swallow screen will be obtained. EEG will be ordered.  #2 history of proximal atrial fibrillation on long-term anticoagulants: Continue with warfarin. He did miss his dose of warfarin 2 days ago, which could account for subtherapeutic INR. He has been counseled not to miss any further doses. Pharmacy to help with warfarin dosing. Continue with his  amiodarone. Monitor blood pressure closely. Hold his beta blocker for now due to low blood pressures.  #3 diabetes mellitus, type II: Continue with his Lantus and sliding scale coverage.  #4 chronic kidney disease, stage III: Appears to be close to baseline. Continue to monitor.  #5 history of ischemic cardiomyopathy with EF of about 30-35% based on echocardiogram from September of 2014. He appears to be well compensated at this time. Continue with Lasix.  #6 history of colon cancer in the past: He has a colostomy, which seems to be functioning well. Not an active issue at this time.   DVT Prophylaxis: Continue warfarin Code Status: Full code Family Communication: Discussed with patient and his wife  Disposition Plan: Admit to telemetry   Further management decisions will depend on results of further testing and patient's response to treatment.   [facility]  [facility] Pager [phone]  If 7PM-7AM, please contact night-coverage [email or web address] Password [facility]  03/01/2014, 12:52 AM

## 2014-03-01 NOTE — Consult Note (Signed)
[place] A. Merlene Laughter, MD     [email or web address]          [name] is an 58 y.o. male.   ASSESSMENT/PLAN: 1. Right hemispheric TIA. Risk factors  Paroxysmal atrial fibrillation, diabetes, hypertension and the heart failure. Atrial fibrillation is undoubtedly the most significant risk factor however. Given that the patient's INR was subtherapeutic on warfarin, would recommend that he be switched to other new or anticoagulation. He is on aspirin although is uncertain if this is strictly of a coronary protection.  I do not believe that he needs to be on aspirin along with the Coumadin for stroke prevention.The continuation of aspirin will be deferred to his cardiologist.  2. Gait impairment thought to be due to recurrent vertigo which has improved with meclizine. This therefore does not disqualify the patient for long-term anticoagulation.  3. Likely significant sleep apnea syndrome. Sleep testing is recommended.   This is a 58 year old right-handed white male who developed the acute onset of left-sided numbness and a ataxia 8 PM last night while he was watching the basketball game. The event lasted for about 10 minutes and resolved. The patient does not report having weakness but it appeared that the left side was incoordinated. He did seem to have weakness of the left facial area lower aspect per the wife. There may be in some mild dysarthria. No loss of consciousness, headaches, chest pain or shortness of breath. The patient has had episodic dizziness since November associated with falling when severe. The dizziness was actually more of a spinning sensation when he developed in November but over time he reports having more lightheadedness. He was evaluated by an otolaryngologist in [place] was given meclizine which has worked very well. He has not had any falls since then. He denies snoring. The patient however is being evaluated for sleep study was seems appropriate  given his neck examination below.  GENERAL:  Overweight man in no acute distress.  HEENT: Supple. Atraumatic normocephalic. The patient does have a large tongue.  ABDOMEN: soft  EXTREMITIES: No edema   BACK: Normal.  SKIN: Normal by inspection.    MENTAL STATUS: Alert and oriented. Speech, language and cognition are generally intact. Judgment and insight normal.   CRANIAL NERVES: Pupils are equal, round and reactive to light and accommodation; extra ocular movements are full, there is no significant nystagmus; visual fields are full; upper and lower facial muscles are normal in strength and symmetric, there is no flattening of the nasolabial folds; tongue is midline; uvula is midline; shoulder elevation is normal.  MOTOR: Normal tone, bulk and strength; no pronator drift.  COORDINATION: Left finger to nose is normal, right finger to nose is normal, No rest tremor; no intention tremor; no postural tremor; no bradykinesia.  REFLEXES: Deep tendon reflexes are symmetrical and normal. Babinski reflexes are flexor bilaterally.   SENSATION: Normal to light touch.     Past Medical History  Diagnosis Date  . Essential hypertension, benign   . Type 2 diabetes mellitus   . Coronary atherosclerosis of native coronary artery     [facility]MS to LAD 2001 at [facility], PTCA/atherectomy ramus and BMS to LAD 2009  . S/P colonoscopy     Normal via ostomy - September 2009  . Chronic systolic heart failure   . Myocardial infarction, anterior wall     Treated with [facility]PA at [facility] 2000  . GERD (gastroesophageal reflux disease)   . Cardiomyopathy, ischemic     BIV ICD St. Jude,  LVEF 23%  . Paroxysmal atrial fibrillation     Amiodarone and Coumadin  . 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 Prostate cancer   . 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 Transaminitis 12/12/2013  . CHF (congestive heart failure)     Past Surgical History  Procedure Laterality Date  . Internal  defibrillator and pacemaker  2010    x2 St. Jude device  . Abdominal and perineal resection of rectum with total mesorectal excision      10/04/2007  . Colonoscopy      05/17/2007. IMPRESSION: Semilunar, apple-core neoplasm low in the rectum (palpable on digital rectal exam) beginning at 5 cm and corkscrewing up 5 cm in length. This was a low rectal lesion consistent with colorectal carcinoma. It was biosied multiple times. The upstream colon all the way to the cecum appeared normal. Recommendations: Followup on path. Surgical Consultation   . Colonoscopy  09/14/2011    Procedure: COLONOSCOPY;  Surgeon: Daneil Dolin, MD;  Location: [facility]O [facility];  Service: Endoscopy;  Laterality: N/A;  8:30- TCS via colostomy & pt has defibrillator  . Colostomy      Family History  Problem Relation Age of Onset  . Colon cancer Mother 19  . Colon cancer Sister 70  . Coronary artery disease Father     Social History:  reports that he has never smoked. He has never used smokeless tobacco. He reports that he does not drink alcohol or use illicit drugs.  Allergies: No Known Allergies  Medications: Prior to Admission medications   Medication Sig Start Date End Date Taking? Authorizing Provider  amiodarone (PACERONE) 200 MG tablet Take 1 tablet (200 mg total) by mouth daily. 02/11/14  Yes Evans Lance, MD  aspirin 81 MG EC tablet Take 81 mg by mouth at bedtime.    Yes Historical Provider, MD  citalopram (CELEXA) 20 MG tablet Take 10 mg by mouth daily. 11/22/13  Yes Lorretta Harp, MD  co-enzyme Q-10 50 MG capsule Take 50 mg by mouth every morning.    Yes Historical Provider, MD  digoxin (LANOXIN) 0.125 MG tablet Take 0.125 mg by mouth every other day.    Yes Historical Provider, MD  fluticasone (FLONASE) 50 MCG/ACT nasal spray Place 1 spray into both nostrils daily as needed for allergies.  11/21/13  Yes Historical Provider, MD  furosemide (LASIX) 20 MG tablet Take 20 mg by mouth daily.  12/13/13  Yes Lezlie Octave Black, NP  insulin aspart (NOVOLOG) 100 UNIT/ML injection Inject 12-14 Units into the skin 3 (three) times daily as needed for high blood sugar. 14 units at breakfast and 12 units a lunch and 12 units at supper per sliding scale    Yes Historical Provider, MD  insulin glargine (LANTUS) 100 UNIT/ML injection Inject 24 Units into the skin at bedtime.    Yes Historical Provider, MD  isosorbide mononitrate (IMDUR) 30 MG 24 hr tablet Take 15 mg by mouth 2 (two) times daily.   Yes Historical Provider, MD  meclizine (ANTIVERT) 25 MG tablet Take 1 tablet (25 mg total) by mouth 3 (three) times daily as needed for dizziness. 9/83/38  Yes Delora Fuel, MD  metFORMIN (GLUCOPHAGE) 500 MG tablet Take 500 mg by mouth 2 (two) times daily with a meal.   Yes Historical Provider, MD  metoprolol succinate (TOPROL-XL) 25 MG 24 hr tablet Take 25 mg by mouth daily.   Yes Historical Provider, MD  niacin (NIASPAN)  1000 MG CR tablet Take 1,000 mg by mouth at bedtime.     Yes Historical Provider, MD  Omega-3 Fatty Acids (FISH OIL) 1200 MG CAPS Take 1,200 mg by mouth 2 (two) times daily.     Yes Historical Provider, MD  pantoprazole (PROTONIX) 40 MG tablet Take 1 tablet (40 mg total) by mouth daily. 11/20/13  Yes Lorretta Harp, MD  ranolazine (RANEXA) 1000 MG SR tablet Take 1,000 mg by mouth 2 (two) times daily.   Yes Historical Provider, MD  rosuvastatin (CRESTOR) 40 MG tablet Take 40 mg by mouth at bedtime. 01/18/14  Yes Arnoldo Lenis, MD  spironolactone (ALDACTONE) 25 MG tablet Take 1 tablet (25 mg total) by mouth daily. 12/17/13  Yes Mihai Croitoru, MD  warfarin (COUMADIN) 2.5 MG tablet Take 2.5 mg by mouth daily at 6 PM. 01/10/14  Yes Arnoldo Lenis, MD  nitroGLYCERIN (NITROLINGUAL) 0.4 MG/SPRAY spray Place 1 spray under the tongue every 5 (five) minutes as needed. angina    Historical Provider, MD    Scheduled Meds: . amiodarone  200 mg Oral Daily  . aspirin EC  81 mg Oral QHS  . atorvastatin  80 mg Oral q1800   . citalopram  10 mg Oral Daily  . digoxin  0.125 mg Oral QODAY  . furosemide  20 mg Oral Daily  . insulin aspart  0-9 Units Subcutaneous TID WC  . insulin glargine  24 Units Subcutaneous QHS  . isosorbide mononitrate  15 mg Oral BID  . meclizine  25 mg Oral TID  . pantoprazole  40 mg Oral Daily  . ranolazine  1,000 mg Oral BID  . sodium chloride  3 mL Intravenous Q12H  . warfarin  5 mg Oral Once  . Warfarin - Pharmacist Dosing Inpatient   Does not apply Q24H   Continuous Infusions:  PRN Meds:.sodium chloride, acetaminophen, sodium chloride   Blood pressure 90/52, pulse 37, temperature 97.6 F (36.4 C), temperature source Oral, resp. rate 12, height $RemoveBe'5\' 10"'msBZTdoCH$  (1.778 m), weight 99 kg (218 lb 4.1 oz), SpO2 99.00%.   Results for orders placed during the hospital encounter of 02/28/14 (from the past 48 hour(s))  CBC WITH DIFFERENTIAL     Status: Abnormal   Collection Time    02/28/14 11:09 PM      Result Value Ref Range   WBC 5.6  4.0 - 10.5 K/uL   RBC 4.59  4.22 - 5.81 MIL/uL   Hemoglobin 12.1 (*) 13.0 - 17.0 g/dL   HCT 38.2 (*) 39.0 - 52.0 %   MCV 83.2  78.0 - 100.0 fL   MCH 26.4  26.0 - 34.0 pg   MCHC 31.7  30.0 - 36.0 g/dL   RDW 15.8 (*) 11.5 - 15.5 %   Platelets 164  150 - 400 K/uL   Neutrophils Relative % 63  43 - 77 %   Neutro Abs 3.6  1.7 - 7.7 K/uL   Lymphocytes Relative 23  12 - 46 %   Lymphs Abs 1.3  0.7 - 4.0 K/uL   Monocytes Relative 9  3 - 12 %   Monocytes Absolute 0.5  0.1 - 1.0 K/uL   Eosinophils Relative 4  0 - 5 %   Eosinophils Absolute 0.2  0.0 - 0.7 K/uL   Basophils Relative 1  0 - 1 %   Basophils Absolute 0.0  0.0 - 0.1 K/uL  COMPREHENSIVE METABOLIC PANEL     Status: Abnormal   Collection Time  02/28/14 11:09 PM      Result Value Ref Range   Sodium 139  137 - 147 mEq/L   Potassium 4.4  3.7 - 5.3 mEq/L   Chloride 102  96 - 112 mEq/L   CO2 26  19 - 32 mEq/L   Glucose, Bld 246 (*) 70 - 99 mg/dL   BUN 27 (*) 6 - 23 mg/dL   Creatinine, Ser 7.34 (*)  0.50 - 1.35 mg/dL   Calcium 9.2  8.4 - 65.7 mg/dL   Total Protein 7.8  6.0 - 8.3 g/dL   Albumin 3.4 (*) 3.5 - 5.2 g/dL   AST 66 (*) 0 - 37 U/L   ALT 71 (*) 0 - 53 U/L   Alkaline Phosphatase 101  39 - 117 U/L   Total Bilirubin 0.5  0.3 - 1.2 mg/dL   GFR calc non Af Amer 43 (*) >90 mL/min   GFR calc Af Amer 50 (*) >90 mL/min   Comment: (NOTE)     The eGFR has been calculated using the CKD EPI equation.     This calculation has not been validated in all clinical situations.     eGFR's persistently <90 mL/min signify possible Chronic Kidney     Disease.  PRO B NATRIURETIC PEPTIDE     Status: Abnormal   Collection Time    02/28/14 11:09 PM      Result Value Ref Range   Pro B Natriuretic peptide (BNP) 1079.0 (*) 0 - 125 pg/mL  PROTIME-INR     Status: Abnormal   Collection Time    02/28/14 11:09 PM      Result Value Ref Range   Prothrombin Time 17.1 (*) 11.6 - 15.2 seconds   INR 1.43  0.00 - 1.49  URINE RAPID DRUG SCREEN (HOSP PERFORMED)     Status: None   Collection Time    03/01/14  2:15 AM      Result Value Ref Range   Opiates NONE DETECTED  NONE DETECTED   Cocaine NONE DETECTED  NONE DETECTED   Benzodiazepines NONE DETECTED  NONE DETECTED   Amphetamines NONE DETECTED  NONE DETECTED   Tetrahydrocannabinol NONE DETECTED  NONE DETECTED   Barbiturates NONE DETECTED  NONE DETECTED   Comment:            DRUG SCREEN FOR MEDICAL PURPOSES     ONLY.  IF CONFIRMATION IS NEEDED     FOR ANY PURPOSE, NOTIFY LAB     WITHIN 5 DAYS.                LOWEST DETECTABLE LIMITS     FOR URINE DRUG SCREEN     Drug Class       Cutoff (ng/mL)     Amphetamine      1000     Barbiturate      200     Benzodiazepine   200     Tricyclics       300     Opiates          300     Cocaine          300     THC              50  CBC     Status: Abnormal   Collection Time    03/01/14  5:08 AM      Result Value Ref Range   WBC 6.1  4.0 - 10.5 K/uL   RBC 4.47  4.22 - 5.81 MIL/uL   Hemoglobin 11.6 (*) 13.0  - 17.0 g/dL   HCT 37.1 (*) 39.0 - 52.0 %   MCV 83.0  78.0 - 100.0 fL   MCH 26.0  26.0 - 34.0 pg   MCHC 31.3  30.0 - 36.0 g/dL   RDW 15.9 (*) 11.5 - 15.5 %   Platelets 166  150 - 400 K/uL  COMPREHENSIVE METABOLIC PANEL     Status: Abnormal   Collection Time    03/01/14  5:08 AM      Result Value Ref Range   Sodium 139  137 - 147 mEq/L   Potassium 4.2  3.7 - 5.3 mEq/L   Chloride 105  96 - 112 mEq/L   CO2 24  19 - 32 mEq/L   Glucose, Bld 249 (*) 70 - 99 mg/dL   BUN 25 (*) 6 - 23 mg/dL   Creatinine, Ser 1.56 (*) 0.50 - 1.35 mg/dL   Calcium 8.7  8.4 - 10.5 mg/dL   Total Protein 6.8  6.0 - 8.3 g/dL   Albumin 3.0 (*) 3.5 - 5.2 g/dL   AST 62 (*) 0 - 37 U/L   ALT 63 (*) 0 - 53 U/L   Alkaline Phosphatase 84  39 - 117 U/L   Total Bilirubin 0.4  0.3 - 1.2 mg/dL   GFR calc non Af Amer 47 (*) >90 mL/min   GFR calc Af Amer 54 (*) >90 mL/min   Comment: (NOTE)     The eGFR has been calculated using the CKD EPI equation.     This calculation has not been validated in all clinical situations.     eGFR's persistently <90 mL/min signify possible Chronic Kidney     Disease.  LIPID PANEL     Status: Abnormal   Collection Time    03/01/14  5:08 AM      Result Value Ref Range   Cholesterol 115  0 - 200 mg/dL   Triglycerides 149  <150 mg/dL   HDL 34 (*) >39 mg/dL   Total CHOL/HDL Ratio 3.4     VLDL 30  0 - 40 mg/dL   LDL Cholesterol 51  0 - 99 mg/dL   Comment:            Total Cholesterol/HDL:CHD Risk     Coronary Heart Disease Risk Table                         Men   Women      1/2 Average Risk   3.4   3.3      Average Risk       5.0   4.4      2 X Average Risk   9.6   7.1      3 X Average Risk  23.4   11.0                Use the calculated Patient Ratio     above and the CHD Risk Table     to determine the patient's CHD Risk.                ATP III CLASSIFICATION (LDL):      <100     mg/dL   Optimal      100-129  mg/dL   Near or Above  Optimal      130-159   mg/dL   Borderline      160-189  mg/dL   High      >190     mg/dL   Very High    Ct Head Wo Contrast  02/28/2014   CLINICAL DATA:  Transient slurred speech tonight.  EXAM: CT HEAD WITHOUT CONTRAST  TECHNIQUE: Contiguous axial images were obtained from the base of the skull through the vertex without intravenous contrast.  COMPARISON:  01/28/2014  FINDINGS: Normal appearing cerebral hemispheres and posterior fossa structures. Normal size and position of ventricles. No intracranial hemorrhage, mass lesion or CT evidence of acute infarction. Dense bilateral vertebral and internal carotid artery atheromatous calcifications. Unremarkable bones and included paranasal sinuses.  IMPRESSION: No acute abnormality.   Electronically Signed   By: Enrique Sack M.D.   On: 02/28/2014 22:27   Dg Chest Port 1 View  03/01/2014   CLINICAL DATA:  Left-sided weakness.  EXAM: PORTABLE CHEST - 1 VIEW  COMPARISON:  Chest x-ray 01/26/2014.  FINDINGS: Lung volumes are normal. No consolidative airspace disease. No pleural effusions. No evidence of pulmonary edema. Heart size is mildly enlarged (unchanged). Left-sided biventricular pacemaker/ AICD with lead tips projecting over the expected location of the right atrium, anterior wall of the left ventricle via the coronary sinus and coronary veins, and the right ventricle.  IMPRESSION: 1. No radiographic evidence of acute cardiopulmonary disease. 2. Mild cardiomegaly.   Electronically Signed   By: Vinnie Langton M.D.   On: 03/01/2014 00:21        Alinna Siple A. Merlene Laughter, M.D.  Diplomate, Tax adviser of Psychiatry and Neurology ( Neurology). 03/01/2014, 8:46 AM

## 2014-03-01 NOTE — Care Management Note (Signed)
    Page 1 of 1   03/01/2014     2:22:37 PM   CARE MANAGEMENT NOTE 03/01/2014  Patient:  [name], [name]   Account Number:  [id number]  Date Initiated:  03/01/2014  Documentation initiated by:  Theophilus Kinds  Subjective/Objective Assessment:   Pt admitted from home with TIA. Pt lives with his wife and will return home at discharge. Pt has been independent with ADL's.     Action/Plan:   no Cm needs noted.   Anticipated DC Date:  03/02/2014   Anticipated DC Plan:  [place]  CM consult      Choice offered to / List presented to:             Status of service:  Completed, signed off Medicare Important Message given?  NA - LOS <3 / Initial given by admissions (If response is "NO", the following Medicare IM given date fields will be blank) Date Medicare IM given:   Date Additional Medicare IM given:    Discharge Disposition:  HOME/SELF CARE  Per UR Regulation:    If discussed at Long Length of Stay Meetings, dates discussed:    Comments:  03/01/14 [place], RN BSN CM

## 2014-03-01 NOTE — Progress Notes (Signed)
UR chart review completed.  

## 2014-03-01 NOTE — Progress Notes (Signed)
OT Cancellation Note  Patient Details Name: [name] MRN: [id number] DOB: 08-09-1956   Cancelled Treatment:    Reason Eval/Treat Not Completed: OT screened, no needs identified, will sign off Pt verbalized his return to baselines and does not have any concerns about continued independence upon d/c. Was able to complete clock test without difficulty.  Bea Graff, [place], OTR/L [phone]  03/01/2014, 2:46 PM

## 2014-03-01 NOTE — Progress Notes (Signed)
*  PRELIMINARY RESULTS* Echocardiogram 2D Echocardiogram has been performed.  [place], [place] 03/01/2014, 10:45 AM

## 2014-03-01 NOTE — Progress Notes (Signed)
Inpatient Diabetes Program Recommendations  AACE/ADA: New Consensus Statement on Inpatient Glycemic Control (2013)  Target Ranges:  Prepandial:   less than 140 mg/dL      Peak postprandial:   less than 180 mg/dL (1-2 hours)      Critically ill patients:  140 - 180 mg/dL   Results for SHADEED, COLBERG (MRN [id number]) as of 03/01/2014 06:23  Ref. Range 02/28/2014 23:09 03/01/2014 05:08  Glucose Latest Range: 70-99 mg/dL 246 (H) 249 (H)   Diabetes history: DM2  Outpatient Diabetes medications: Lantus 24 units QHS, Novolog 14 units with breakfast, Novolog 12 units with lunch, Novolog 12 units with supper, and Metformin 500 mg BID  Current orders for Inpatient glycemic control: Lantus 24 units QHS, Novolog 0-9 units AC   Inpatient Diabetes Program Recommendations Insulin - Meal Coverage: Please consider ordering Novolog 10 units TID wtih meals for meal coverage.  Thanks, Barnie Alderman, RN, MSN, CCRN Diabetes Coordinator Inpatient Diabetes Program [phone] (Team Pager) [phone] ([facility] office) [phone] [facility] office)

## 2014-03-01 NOTE — Progress Notes (Signed)
D.c instructions reviewed with patient.  Verbalized understanding.  Pt dc'd to home with wife.  [place], Eulis Canner 03/01/2014

## 2014-03-01 NOTE — Discharge Instructions (Signed)
Transient Ischemic Attack  A transient ischemic attack (TIA) is a "warning stroke" that causes stroke-like symptoms. Unlike a stroke, a TIA does not cause permanent damage to the brain. The symptoms of a TIA can happen very fast and do not last long. It is important to know the symptoms of a TIA and what to do. This can help prevent a major stroke or death.  CAUSES   · A TIA is caused by a temporary blockage in an artery in the brain or neck (carotid artery). The blockage does not allow the brain to get the blood supply it needs and can cause different symptoms. The blockage can be caused by either:  · A blood clot.  · Fatty buildup (plaque) in a neck or brain artery.  RISK FACTORS  · High blood pressure (hypertension).  · High cholesterol.  · Diabetes mellitus.  · Heart disease.  · The build up of plaque in the blood vessels (peripheral artery disease or atherosclerosis).  · The build up of plaque in the blood vessels providing blood and oxygen to the brain (carotid artery stenosis).  · An abnormal heart rhythm (atrial fibrillation).  · Obesity.  · Smoking.  · Taking oral contraceptives (especially in combination with smoking).  · Physical inactivity.  · A diet high in fats, salt (sodium), and calories.  · Alcohol use.  · Use of illegal drugs (especially cocaine and methamphetamine).  · Being male.  · Being African American.  · Being over the age of 55.  · Family history of stroke.  · Previous history of blood clots, stroke, TIA, or heart attack.  · Sickle cell disease.  SYMPTOMS   TIA symptoms are the same as a stroke but are temporary. These symptoms usually develop suddenly, or may be newly present upon awakening from sleep:  · Sudden weakness or numbness of the face, arm, or leg, especially on one side of the body.  · Sudden trouble walking or difficulty moving arms or legs.  · Sudden confusion.  · Sudden personality changes.  · Trouble speaking (aphasia) or understanding.  · Difficulty swallowing.  · Sudden  trouble seeing in one or both eyes.  · Double vision.  · Dizziness.  · Loss of balance or coordination.  · Sudden severe headache with no known cause.  · Trouble reading or writing.  · Loss of bowel or bladder control.  · Loss of consciousness.  DIAGNOSIS   Your caregiver may be able to determine the presence or absence of a TIA based on your symptoms, history, and physical exam. Computed tomography (CT scan) of the brain is usually performed to help identify a TIA. Other tests may be done to diagnose a TIA. These tests may include:  · Electrocardiography.  · Continuous heart monitoring.  · Echocardiography.  · Carotid ultrasonography.  · Magnetic resonance imaging (MRI).  · A scan of the brain circulation.  · Blood tests.  PREVENTION   The risk of a TIA can be decreased by appropriately treating high blood pressure, high cholesterol, diabetes, heart disease, and obesity and by quitting smoking, limiting alcohol, and staying physically active.  TREATMENT   Time is of the essence. Since the symptoms of TIA are the same as a stroke, it is important to seek treatment within 3 4½ hours of the start of symptoms because you may receive a medicine to dissolve the clot (thrombolytic) that cannot be given after that time. Treatment options vary. Treatment options may include rest, oxygen, intravenous (  IV) fluids, and medicines to thin the blood (anticoagulants). Medicines and diet may be used to address diabetes, high blood pressure, and other risk factors. Measures will be taken to prevent short-term and long-term complications, including infection from breathing foreign material into the lungs (aspiration pneumonia), blood clots in the legs, and falls. Treatment options include procedures to either remove plaque in the carotid arteries or dilate carotid arteries that have narrowed due to plaque. Those procedures are:  · Carotid endarterectomy.  · Carotid angioplasty and stenting.  HOME CARE INSTRUCTIONS   · Take all  medicines prescribed by your caregiver. Follow the directions carefully. Medicines may be used to control risk factors for a stroke. Be sure you understand all your medicine instructions.  · You may be told to take aspirin or the anticoagulant warfarin. Warfarin needs to be taken exactly as instructed.  · Taking too much or too little warfarin is dangerous. Too much warfarin increases the risk of bleeding. Too little warfarin continues to allow the risk for blood clots. While taking warfarin, you will need to have regular blood tests to measure your blood clotting time. A PT blood test measures how long it takes for blood to clot. Your PT is used to calculate another value called an INR. Your PT and INR help your caregiver to adjust your dose of warfarin. The dose can change for many reasons. It is critically important that you take warfarin exactly as prescribed.  · Many foods, especially foods high in vitamin K can interfere with warfarin and affect the PT and INR. Foods high in vitamin K include spinach, kale, broccoli, cabbage, collard and turnip greens, brussels sprouts, peas, cauliflower, seaweed, and parsley as well as beef and pork liver, green tea, and soybean oil. You should eat a consistent amount of foods high in vitamin K. Avoid major changes in your diet, or notify your caregiver before changing your diet. Arrange a visit with a dietitian to answer your questions.  · Many medicines can interfere with warfarin and affect the PT and INR. You must tell your caregiver about any and all medicines you take, this includes all vitamins and supplements. Be especially cautious with aspirin and anti-inflammatory medicines. Do not take or discontinue any prescribed or over-the-counter medicine except on the advice of your caregiver or pharmacist.  · Warfarin can have side effects, such as excessive bruising or bleeding. You will need to hold pressure over cuts for longer than usual. Your caregiver or pharmacist  will discuss other potential side effects.  · Avoid sports or activities that may cause injury or bleeding.  · Be mindful when shaving, flossing your teeth, or handling sharp objects.  · Alcohol can change the body's ability to handle warfarin. It is best to avoid alcoholic drinks or consume only very small amounts while taking warfarin. Notify your caregiver if you change your alcohol intake.  · Notify your dentist or other caregivers before procedures.  · Eat a diet that includes 5 or more servings of fruits and vegetables each day. This may reduce the risk of stroke. Certain diets may be prescribed to address high blood pressure, high cholesterol, diabetes, or obesity.  · A low-sodium, low-saturated fat, low-trans fat, low-cholesterol diet is recommended to manage high blood pressure.  · A low-saturated fat, low-trans fat, low-cholesterol, and high-fiber diet may control cholesterol levels.  · A controlled-carbohydrate, controlled-sugar diet is recommended to manage diabetes.  · A reduced-calorie, low-sodium, low-saturated fat, low-trans fat, low-cholesterol diet is recommended to   manage obesity.  · Maintain a healthy weight.  · Stay physically active. It is recommended that you get at least 30 minutes of activity on most or all days.  · Do not smoke.  · Limit alcohol use even if you are not taking warfarin. Moderate alcohol use is considered to be:  · No more than 2 drinks each day for men.  · No more than 1 drink each day for nonpregnant women.  · Stop drug abuse.  · Home safety. A safe home environment is important to reduce the risk of falls. Your caregiver may arrange for specialists to evaluate your home. Having grab bars in the bedroom and bathroom is often important. Your caregiver may arrange for equipment to be used at home, such as raised toilets and a seat for the shower.  · Follow all instructions for follow-up with your caregiver. This is very important. This includes any referrals and lab tests.  Proper follow up can prevent a stroke or another TIA from occurring.  SEEK MEDICAL CARE IF:  · You have personality changes.  · You have difficulty swallowing.  · You are seeing double.  · You have dizziness.  · You have a fever.  · You have skin breakdown.  SEEK IMMEDIATE MEDICAL CARE IF:   Any of these symptoms may represent a serious problem that is an emergency. Do not wait to see if the symptoms will go away. Get medical help right away. Call your local emergency services (911 in [place] Do not drive yourself to the hospital.  · You have sudden weakness or numbness of the face, arm, or leg, especially on one side of the body.  · You have sudden trouble walking or difficulty moving arms or legs.  · You have sudden confusion.  · You have trouble speaking (aphasia) or understanding.  · You have sudden trouble seeing in one or both eyes.  · You have a loss of balance or coordination.  · You have a sudden, severe headache with no known cause.  · You have new chest pain or an irregular heartbeat.  · You have a partial or total loss of consciousness.  MAKE SURE YOU:   · Understand these instructions.  · Will watch your condition.  · Will get help right away if you are not doing well or get worse.  Document Released: 09/01/2005 Document Revised: 11/08/2012 Document Reviewed: 01/15/2010  Exit[employer]® Patient Information ©2014 [employer].

## 2014-03-01 NOTE — Progress Notes (Signed)
Nutrition Brief Note  Patient identified on the Malnutrition Screening Tool (MST) Report  Wt Readings from Last 15 Encounters:  03/01/14 218 lb 4.1 oz (99 kg)  02/22/14 213 lb 3.2 oz (96.707 kg)  02/12/14 216 lb (97.977 kg)  02/11/14 217 lb (98.431 kg)  01/29/14 212 lb 1.3 oz (96.2 kg)  12/25/13 220 lb (99.791 kg)  12/11/13 214 lb (97.07 kg)  12/08/13 218 lb 3 oz (98.969 kg)  11/24/13 210 lb (95.255 kg)  11/08/13 224 lb 8 oz (101.833 kg)  10/29/13 224 lb (101.606 kg)  10/25/13 219 lb 9.3 oz (99.6 kg)  08/09/13 228 lb 4 oz (103.534 kg)  08/01/13 229 lb (103.874 kg)  07/17/13 228 lb 3.2 oz (103.511 kg)   Pt admitted with TIA. Spoke with pt who reported good appetite PTA. Denies weight loss; wt hx reveals 4.4% wt loss x 6 months, which is not clinically significant. Pt was drowsy at time of visit and would answer some of this RD's questions, but would quickly fall back asleep.   Body mass index is 31.32 kg/(m^2). Patient meets criteria for obesity, class I based on current BMI.   Current diet order is Carb Modified, patient is consuming approximately n/a% of meals at this time. No recorded PO intake available at this time. Pt reports good appetite during hospitalization and PTA. Labs and medications reviewed.   No nutrition interventions warranted at this time. If nutrition issues arise, please consult RD.   Haile Toppins A. Jimmye Norman, RD, LDN Pager: [phone]

## 2014-03-01 NOTE — Progress Notes (Signed)
ANTICOAGULATION CONSULT NOTE - Initial Consult  Pharmacy Consult for Coumadin Indication: atrial fibrillation  No Known Allergies  Patient Measurements: Height: 5\' 10"  (177.8 cm) Weight: 218 lb 4.1 oz (99 kg) IBW/kg (Calculated) : 73  Vital Signs: Temp: 97.6 F (36.4 C) (03/27 0559) Temp src: Oral (03/27 0559) BP: 90/52 mmHg (03/27 0645) Pulse Rate: 37 (03/27 0559)  Labs:  Recent Labs  02/28/14 2309 03/01/14 0508  HGB 12.1* 11.6*  HCT 38.2* 37.1*  PLT 164 166  LABPROT 17.1*  --   INR 1.43  --   CREATININE 1.67* 1.56*    Estimated Creatinine Clearance: 60.1 ml/min (by C-G formula based on Cr of 1.56).   Medical History: Past Medical History  Diagnosis Date  . Essential hypertension, benign   . Type 2 diabetes mellitus   . Coronary atherosclerosis of native coronary artery     BMS to LAD 2001 at [facility], PTCA/atherectomy ramus and BMS to LAD 2009  . S/P colonoscopy     Normal via ostomy - September 2009  . Chronic systolic heart failure   . Myocardial infarction, anterior wall     Treated with tPA at [facility] 2000  . GERD (gastroesophageal reflux disease)   . Cardiomyopathy, ischemic     BIV ICD St. Jude, LVEF 23%  . Paroxysmal atrial fibrillation     Amiodarone and Coumadin  . 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 Prostate cancer   . 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 Transaminitis 12/12/2013  . CHF (congestive heart failure)     Medications:  Prescriptions prior to admission  Medication Sig Dispense Refill  . amiodarone (PACERONE) 200 MG tablet Take 1 tablet (200 mg total) by mouth daily.  30 tablet  1  . aspirin 81 MG EC tablet Take 81 mg by mouth at bedtime.       . citalopram (CELEXA) 20 MG tablet Take 10 mg by mouth daily.      Marland Kitchen co-enzyme Q-10 50 MG capsule Take 50 mg by mouth every morning.       . digoxin (LANOXIN) 0.125 MG tablet Take 0.125 mg by mouth every other day.       . fluticasone (FLONASE) 50 MCG/ACT nasal  spray Place 1 spray into both nostrils daily as needed for allergies.       . furosemide (LASIX) 20 MG tablet Take 20 mg by mouth daily.       . insulin aspart (NOVOLOG) 100 UNIT/ML injection Inject 12-14 Units into the skin 3 (three) times daily as needed for high blood sugar. 14 units at breakfast and 12 units a lunch and 12 units at supper per sliding scale       . insulin glargine (LANTUS) 100 UNIT/ML injection Inject 24 Units into the skin at bedtime.       . isosorbide mononitrate (IMDUR) 30 MG 24 hr tablet Take 15 mg by mouth 2 (two) times daily.      . meclizine (ANTIVERT) 25 MG tablet Take 1 tablet (25 mg total) by mouth 3 (three) times daily as needed for dizziness.  30 tablet  0  . metFORMIN (GLUCOPHAGE) 500 MG tablet Take 500 mg by mouth 2 (two) times daily with a meal.      . metoprolol succinate (TOPROL-XL) 25 MG 24 hr tablet Take 25 mg by mouth daily.      . niacin (NIASPAN) 1000 MG CR tablet Take 1,000 mg by mouth at bedtime.        Marland Kitchen  Omega-3 Fatty Acids (FISH OIL) 1200 MG CAPS Take 1,200 mg by mouth 2 (two) times daily.        . pantoprazole (PROTONIX) 40 MG tablet Take 1 tablet (40 mg total) by mouth daily.  30 tablet  6  . ranolazine (RANEXA) 1000 MG SR tablet Take 1,000 mg by mouth 2 (two) times daily.      . rosuvastatin (CRESTOR) 40 MG tablet Take 40 mg by mouth at bedtime.      Marland Kitchen spironolactone (ALDACTONE) 25 MG tablet Take 1 tablet (25 mg total) by mouth daily.  45 tablet  6  . warfarin (COUMADIN) 2.5 MG tablet Take 2.5 mg by mouth daily at 6 PM.      . nitroGLYCERIN (NITROLINGUAL) 0.4 MG/SPRAY spray Place 1 spray under the tongue every 5 (five) minutes as needed. angina       Assessment: 58yo male on chronic Coumadin PTA for h/o afib.  Home dose listed above.  INR is subtherapeutic on admission.    Goal of Therapy:  INR 2-3 Monitor platelets by anticoagulation protocol: Yes   Plan:  Coumadin 5mg  po today x 1 (to boost INR) INR daily  [place] Crane, Adonia Porada A 03/01/2014,8:47  AM

## 2014-03-02 DIAGNOSIS — R0902 Hypoxemia: Secondary | ICD-10-CM | POA: Diagnosis not present

## 2014-03-04 ENCOUNTER — Telehealth: Payer: Self-pay

## 2014-03-04 DIAGNOSIS — I4891 Unspecified atrial fibrillation: Secondary | ICD-10-CM | POA: Diagnosis not present

## 2014-03-04 DIAGNOSIS — I5022 Chronic systolic (congestive) heart failure: Secondary | ICD-10-CM | POA: Diagnosis not present

## 2014-03-04 DIAGNOSIS — R0609 Other forms of dyspnea: Secondary | ICD-10-CM | POA: Diagnosis not present

## 2014-03-04 DIAGNOSIS — Z79899 Other long term (current) drug therapy: Secondary | ICD-10-CM | POA: Diagnosis not present

## 2014-03-04 DIAGNOSIS — E785 Hyperlipidemia, unspecified: Secondary | ICD-10-CM | POA: Diagnosis not present

## 2014-03-04 DIAGNOSIS — I4510 Unspecified right bundle-branch block: Secondary | ICD-10-CM | POA: Diagnosis not present

## 2014-03-04 DIAGNOSIS — Z5181 Encounter for therapeutic drug level monitoring: Secondary | ICD-10-CM | POA: Diagnosis not present

## 2014-03-04 DIAGNOSIS — I4892 Unspecified atrial flutter: Secondary | ICD-10-CM | POA: Diagnosis not present

## 2014-03-04 DIAGNOSIS — I10 Essential (primary) hypertension: Secondary | ICD-10-CM | POA: Diagnosis not present

## 2014-03-04 DIAGNOSIS — I509 Heart failure, unspecified: Secondary | ICD-10-CM | POA: Diagnosis not present

## 2014-03-04 LAB — GLUCOSE, CAPILLARY: GLUCOSE-CAPILLARY: 227 mg/dL — AB (ref 70–99)

## 2014-03-04 NOTE — Telephone Encounter (Signed)
To [name] to make  for 2-3 post hosp apt for Dr.Branch

## 2014-03-04 NOTE — Telephone Encounter (Signed)
Message copied by Bernita Raisin on Mon Mar 04, 2014  8:04 AM ------      Message from: Gallaway F      Created: Sun Mar 03, 2014  7:05 PM       Can you have this patient follow up with me in 2-3 weeks, was in hospital recently            Carlyle Dolly MD ------

## 2014-03-05 DIAGNOSIS — H2510 Age-related nuclear cataract, unspecified eye: Secondary | ICD-10-CM | POA: Diagnosis not present

## 2014-03-05 DIAGNOSIS — E119 Type 2 diabetes mellitus without complications: Secondary | ICD-10-CM | POA: Diagnosis not present

## 2014-03-06 ENCOUNTER — Encounter: Payer: Medicare Other | Admitting: Adult Health

## 2014-03-06 ENCOUNTER — Ambulatory Visit (INDEPENDENT_AMBULATORY_CARE_PROVIDER_SITE_OTHER): Payer: Medicare Other | Admitting: Gastroenterology

## 2014-03-06 ENCOUNTER — Encounter: Payer: Self-pay | Admitting: Gastroenterology

## 2014-03-06 VITALS — BP 83/56 | HR 83 | Temp 97.6°F | Ht 70.0 in | Wt 219.4 lb

## 2014-03-06 DIAGNOSIS — C20 Malignant neoplasm of rectum: Secondary | ICD-10-CM

## 2014-03-06 DIAGNOSIS — R7402 Elevation of levels of lactic acid dehydrogenase (LDH): Secondary | ICD-10-CM

## 2014-03-06 DIAGNOSIS — R112 Nausea with vomiting, unspecified: Secondary | ICD-10-CM | POA: Diagnosis not present

## 2014-03-06 DIAGNOSIS — I2589 Other forms of chronic ischemic heart disease: Secondary | ICD-10-CM

## 2014-03-06 DIAGNOSIS — R7401 Elevation of levels of liver transaminase levels: Secondary | ICD-10-CM | POA: Diagnosis not present

## 2014-03-06 DIAGNOSIS — K824 Cholesterolosis of gallbladder: Secondary | ICD-10-CM

## 2014-03-06 DIAGNOSIS — R740 Nonspecific elevation of levels of transaminase and lactic acid dehydrogenase [LDH]: Secondary | ICD-10-CM

## 2014-03-06 NOTE — Patient Instructions (Signed)
I would like to repeat the ultrasound of your gallbladder in about 6 months to make sure there is no polyp noted.   We will see you in September to schedule the colonoscopy.   Please call if you have any recurrent symptoms!

## 2014-03-06 NOTE — Progress Notes (Signed)
Referring Provider: Redmond School, MD Primary Care Physician:  Glo Herring., MD Primary GI: Dr. Gala Romney  Oncologist: Dr. Larey Seat  Surgeon: Dr. Fanny Skates  Cardiologist: [facility]  CHF Clinic: Dr. Carolynn Serve at [facility] Complaint  Patient presents with  . Follow-up    HPI:   [name] presents today with a history of rectal adenocarcinoma, diagnosed in 2008 and treated with chemoradiation, underwent APR, post-op chemo. Last surveillance through ostomy was Oct 2012 with benign inflammatory polyp. He is due for high risk surveillance October 2015. He also has a family history of colon cancer in both his mother and sister.   Notes he had issues with N/V, dizziness. Diagnosed with vertigo and treated with resolution of symptoms. Recently discharged from hospital after possible TIA. Taken off Coumadin at that time. Placed on Eliquis by [facility]. Dr. Andree Elk aware of hypotension. At [facility] was 80s/60s. Protonix once daily. No dysphagia. No abdominal pain. No melena, hematochezia. Ostomy output at baseline, 1 soft stool a day. N/V completed resolved.   Past Medical History  Diagnosis Date  . Essential hypertension, benign   . Type 2 diabetes mellitus   . Coronary atherosclerosis of native coronary artery     BMS to LAD 2001 at [facility], PTCA/atherectomy ramus and BMS to LAD 2009  . S/P colonoscopy     Normal via ostomy - September 2009  . Chronic systolic heart failure   . Myocardial infarction, anterior wall     Treated with tPA at [facility] 2000  . GERD (gastroesophageal reflux disease)   . Cardiomyopathy, ischemic     BIV ICD St. Jude, LVEF 23%  . Paroxysmal atrial fibrillation     Amiodarone and Coumadin  . 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 Prostate cancer     s/p seed implants with chemo and radiation  . 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 Transaminitis 12/12/2013  . CHF (congestive heart failure)     . TIA (transient ischemic attack)     Past Surgical History  Procedure Laterality Date  . Internal defibrillator and pacemaker  2010    x2 St. Jude device  . Abdominal and perineal resection of rectum with total mesorectal excision      10/04/2007  . Colonoscopy      05/17/2007. IMPRESSION: Semilunar, apple-core neoplasm low in the rectum (palpable on digital rectal exam) beginning at 5 cm and corkscrewing up 5 cm in length. This was a low rectal lesion consistent with colorectal carcinoma. It was biosied multiple times. The upstream colon all the way to the cecum appeared normal. Recommendations: Followup on path. Surgical Consultation   . Colonoscopy  09/14/2011    Dr. Gala Romney: via colostomy, Single pedunculated benign inflammatory polyp. Due for surveillance Oct 2015  . Colostomy      Current Outpatient Prescriptions  Medication Sig Dispense Refill  . amiodarone (PACERONE) 200 MG tablet Take 1 tablet (200 mg total) by mouth daily.  30 tablet  1  . apixaban (ELIQUIS) 5 MG TABS tablet Take 5 mg by mouth 2 (two) times daily.      Marland Kitchen aspirin 81 MG EC tablet Take 81 mg by mouth at bedtime.       . citalopram (CELEXA) 20 MG tablet Take 10 mg by mouth daily.      Marland Kitchen co-enzyme Q-10 50 MG capsule Take 50 mg by mouth every morning.       Marland Kitchen  digoxin (LANOXIN) 0.125 MG tablet Take 0.125 mg by mouth every other day.       . fluticasone (FLONASE) 50 MCG/ACT nasal spray Place 1 spray into both nostrils daily as needed for allergies.       . furosemide (LASIX) 20 MG tablet Take 20 mg by mouth daily.       . insulin aspart (NOVOLOG) 100 UNIT/ML injection Inject 12-14 Units into the skin 3 (three) times daily as needed for high blood sugar. 14 units at breakfast and 12 units a lunch and 12 units at supper per sliding scale       . insulin glargine (LANTUS) 100 UNIT/ML injection Inject 24 Units into the skin at bedtime.       . isosorbide mononitrate (IMDUR) 30 MG 24 hr tablet Take 15 mg by mouth 2 (two)  times daily.      . metFORMIN (GLUCOPHAGE) 500 MG tablet Take 500 mg by mouth 2 (two) times daily with a meal.      . metoprolol succinate (TOPROL-XL) 25 MG 24 hr tablet Take 25 mg by mouth daily.      . niacin (NIASPAN) 1000 MG CR tablet Take 1,000 mg by mouth at bedtime.        . nitroGLYCERIN (NITROLINGUAL) 0.4 MG/SPRAY spray Place 1 spray under the tongue every 5 (five) minutes as needed. angina      . Omega-3 Fatty Acids (FISH OIL) 1200 MG CAPS Take 1,200 mg by mouth 2 (two) times daily.        . pantoprazole (PROTONIX) 40 MG tablet Take 1 tablet (40 mg total) by mouth daily.  30 tablet  6  . ranolazine (RANEXA) 1000 MG SR tablet Take 1,000 mg by mouth 2 (two) times daily.      . rosuvastatin (CRESTOR) 40 MG tablet Take 40 mg by mouth at bedtime.      Marland Kitchen spironolactone (ALDACTONE) 25 MG tablet Take 1 tablet (25 mg total) by mouth daily.  45 tablet  6  . meclizine (ANTIVERT) 25 MG tablet Take 1 tablet (25 mg total) by mouth 3 (three) times daily as needed for dizziness.  30 tablet  0   No current facility-administered medications for this visit.    Allergies as of 03/06/2014  . (No Known Allergies)    Family History  Problem Relation Age of Onset  . Colon cancer Mother 60  . Colon cancer Sister 80  . Coronary artery disease Father     History   Social History  . Marital Status: Married    Spouse Name: N/A    Number of Children: 1  . Years of Education: N/A   Occupational History  .     Social History Main Topics  . Smoking status: Never Smoker   . Smokeless tobacco: Never Used  . Alcohol Use: No     Comment: Former user  . Drug Use: No  . Sexual Activity: No   Other Topics Concern  . None   Social History Narrative  . None    Review of Systems: As mentioned in HPI.   Physical Exam: BP 83/56  Pulse 83  Temp(Src) 97.6 F (36.4 C) (Oral)  Ht 5\' 10"  (1.778 m)  Wt 219 lb 6.4 oz (99.519 kg)  BMI 31.48 kg/m2 General:   Alert and oriented. No distress noted.  Pleasant and cooperative.  Head:  Normocephalic and atraumatic. Eyes:  Conjuctiva clear without scleral icterus. Mouth:  Oral mucosa pink and moist. Good dentition.  No lesions. Heart:  S1, S2 present without murmurs, rubs, or gallops.  Abdomen:  +BS, soft, non-tender and non-distended. No rebound or guarding. LLQ ostomy, bag intact.  Msk:  Symmetrical without gross deformities. Normal posture. Extremities:  Without edema. Neurologic:  Alert and  oriented x4;  grossly normal neurologically. Skin:  Intact without significant lesions or rashes. Psych:  Alert and cooperative. Normal mood and affect.   Lab Results  Component Value Date   WBC 6.1 03/01/2014   HGB 11.6* 03/01/2014   HCT 37.1* 03/01/2014   MCV 83.0 03/01/2014   PLT 166 03/01/2014   Lab Results  Component Value Date   ALT 63* 03/01/2014   AST 62* 03/01/2014   ALKPHOS 84 03/01/2014   BILITOT 0.4 03/01/2014   US abdomen March 2015:  IMPRESSION: Noted fatty liver 1. Echogenic non mobile focus in the gallbladder. This could  represent a nonshadowing stone or polyp.  2. Thickened gallbladder wall. Cholecystitis cannot be excluded.  Ultrasound Murphy sign is negative. No pericholecystic fluid .

## 2014-03-07 ENCOUNTER — Telehealth: Payer: Self-pay | Admitting: Gastroenterology

## 2014-03-07 ENCOUNTER — Encounter: Payer: Medicare Other | Admitting: Adult Health

## 2014-03-07 DIAGNOSIS — R7401 Elevation of levels of liver transaminase levels: Secondary | ICD-10-CM

## 2014-03-07 DIAGNOSIS — R740 Nonspecific elevation of levels of transaminase and lactic acid dehydrogenase [LDH]: Principal | ICD-10-CM

## 2014-03-07 NOTE — Assessment & Plan Note (Signed)
With known fatty liver. On a statin. May need to be adjusted, further work-up if persistent elevation. Recheck HFP in 6 weeks.

## 2014-03-07 NOTE — Assessment & Plan Note (Signed)
Due for high risk surveillance via colostomy in Oct 2015. No concerning signs such as hematochezia, change in bowel habits. Return in Sept to schedule procedure.

## 2014-03-07 NOTE — Telephone Encounter (Signed)
Needs LFTs in 6 weeks. Elevated transaminases noted. On a statin. May need to be adjusted and/or further work-up. Likely secondary to fatty liver.   Instructions for fatty liver: Recommend 1-2# weight loss per week until ideal body weight through exercise & diet. Low fat/cholesterol diet.   Avoid sweets, sodas, fruit juices, sweetened beverages like tea, etc. Gradually increase exercise from 15 min daily up to 1 hr per day 5 days/week. Limit alcohol use.

## 2014-03-07 NOTE — Progress Notes (Signed)
Error

## 2014-03-07 NOTE — Assessment & Plan Note (Signed)
Possible. Noted on recent US of abdomen. Recheck [place] of abdomen in 6 months.

## 2014-03-07 NOTE — Assessment & Plan Note (Signed)
Resolved with treatment of vertigo. Otherwise, no other concerns. No need for EGD unless recurrent symptoms.

## 2014-03-10 NOTE — Procedures (Signed)
Dunlap A. Merlene Laughter, MD     [email or web address]           HISTORY: This is 58 year old man who presents with episode of syncope suspicious for seizures.  MEDICATIONS: Scheduled Meds: Continuous Infusions: PRN Meds:.    Prior to Admission medications   Medication Sig Start Date End Date Taking? Authorizing Provider  amiodarone (PACERONE) 200 MG tablet Take 1 tablet (200 mg total) by mouth daily. 02/11/14   Evans Lance, MD  apixaban (ELIQUIS) 5 MG TABS tablet Take 5 mg by mouth 2 (two) times daily.    Historical Provider, MD  aspirin 81 MG EC tablet Take 81 mg by mouth at bedtime.     Historical Provider, MD  citalopram (CELEXA) 20 MG tablet Take 10 mg by mouth daily. 11/22/13   Lorretta Harp, MD  co-enzyme Q-10 50 MG capsule Take 50 mg by mouth every morning.     Historical Provider, MD  digoxin (LANOXIN) 0.125 MG tablet Take 0.125 mg by mouth every other day.     Historical Provider, MD  fluticasone (FLONASE) 50 MCG/ACT nasal spray Place 1 spray into both nostrils daily as needed for allergies.  11/21/13   Historical Provider, MD  furosemide (LASIX) 20 MG tablet Take 20 mg by mouth daily.  12/13/13   Radene Gunning, NP  insulin aspart (NOVOLOG) 100 UNIT/ML injection Inject 12-14 Units into the skin 3 (three) times daily as needed for high blood sugar. 14 units at breakfast and 12 units a lunch and 12 units at supper per sliding scale     Historical Provider, MD  insulin glargine (LANTUS) 100 UNIT/ML injection Inject 24 Units into the skin at bedtime.     Historical Provider, MD  isosorbide mononitrate (IMDUR) 30 MG 24 hr tablet Take 15 mg by mouth 2 (two) times daily.    Historical Provider, MD  meclizine (ANTIVERT) 25 MG tablet Take 1 tablet (25 mg total) by mouth 3 (three) times daily as needed for dizziness. 2/99/37   Delora Fuel, MD  metFORMIN (GLUCOPHAGE) 500 MG tablet Take 500 mg by mouth 2 (two) times daily with a meal.    Historical Provider, MD    metoprolol succinate (TOPROL-XL) 25 MG 24 hr tablet Take 25 mg by mouth daily.    Historical Provider, MD  niacin (NIASPAN) 1000 MG CR tablet Take 1,000 mg by mouth at bedtime.      Historical Provider, MD  nitroGLYCERIN (NITROLINGUAL) 0.4 MG/SPRAY spray Place 1 spray under the tongue every 5 (five) minutes as needed. angina    Historical Provider, MD  Omega-3 Fatty Acids (FISH OIL) 1200 MG CAPS Take 1,200 mg by mouth 2 (two) times daily.      Historical Provider, MD  pantoprazole (PROTONIX) 40 MG tablet Take 1 tablet (40 mg total) by mouth daily. 11/20/13   Lorretta Harp, MD  ranolazine (RANEXA) 1000 MG SR tablet Take 1,000 mg by mouth 2 (two) times daily.    Historical Provider, MD  rosuvastatin (CRESTOR) 40 MG tablet Take 40 mg by mouth at bedtime. 01/18/14   Arnoldo Lenis, MD  spironolactone (ALDACTONE) 25 MG tablet Take 1 tablet (25 mg total) by mouth daily. 12/17/13   Mihai Croitoru, MD      ANALYSIS: A 16 channel recording using standard 10 20 measurements is conducted for 22 minutes. There is a well-formed posterior dominant rhythm of 8-1/2 Hz which attenuates with eye opening. Sleep activities are recorded. K complexes and spindles observed  consistent with stage II non-REM sleep. Photic stimulation and hyperventilation were not carried out. There are no focal or lateralized slowing. There is no epileptiform discharges seen.   IMPRESSION:  1. This is a normal recording of the awake and sleep states.      Makailee Nudelman A. Merlene Laughter, M.D.  Diplomate, Tax adviser of Psychiatry and Neurology ( Neurology).

## 2014-03-11 DIAGNOSIS — I4891 Unspecified atrial fibrillation: Secondary | ICD-10-CM | POA: Diagnosis not present

## 2014-03-11 NOTE — Progress Notes (Signed)
cc'd to pcp 

## 2014-03-18 NOTE — Telephone Encounter (Signed)
Tried to call pt- NA 

## 2014-03-20 NOTE — Telephone Encounter (Signed)
Tried to call pt- NA 

## 2014-03-21 NOTE — Telephone Encounter (Signed)
Mailed letter to pt

## 2014-03-21 NOTE — Telephone Encounter (Signed)
Lab order on file. 

## 2014-03-25 ENCOUNTER — Other Ambulatory Visit: Payer: Self-pay

## 2014-03-25 DIAGNOSIS — R7401 Elevation of levels of liver transaminase levels: Secondary | ICD-10-CM

## 2014-03-25 DIAGNOSIS — R740 Nonspecific elevation of levels of transaminase and lactic acid dehydrogenase [LDH]: Principal | ICD-10-CM

## 2014-03-28 ENCOUNTER — Institutional Professional Consult (permissible substitution): Payer: Self-pay | Admitting: Neurology

## 2014-04-05 ENCOUNTER — Ambulatory Visit (INDEPENDENT_AMBULATORY_CARE_PROVIDER_SITE_OTHER): Payer: Medicare Other | Admitting: Neurology

## 2014-04-05 ENCOUNTER — Encounter: Payer: Self-pay | Admitting: Neurology

## 2014-04-05 VITALS — BP 88/57 | HR 71 | Temp 96.6°F | Ht 70.0 in | Wt 218.0 lb

## 2014-04-05 DIAGNOSIS — E119 Type 2 diabetes mellitus without complications: Secondary | ICD-10-CM

## 2014-04-05 DIAGNOSIS — I509 Heart failure, unspecified: Secondary | ICD-10-CM

## 2014-04-05 DIAGNOSIS — I4891 Unspecified atrial fibrillation: Secondary | ICD-10-CM | POA: Diagnosis not present

## 2014-04-05 DIAGNOSIS — G459 Transient cerebral ischemic attack, unspecified: Secondary | ICD-10-CM

## 2014-04-05 DIAGNOSIS — I219 Acute myocardial infarction, unspecified: Secondary | ICD-10-CM | POA: Diagnosis not present

## 2014-04-05 NOTE — Progress Notes (Signed)
Subjective:    Patient ID: BRYEN RIEDINGER is a 58 y.o. male.  HPI    Star Age, MD, PhD [facility] [address] [address] [place],  [address]  Dear Dr. Gerarda Fraction,   I saw your patient, Windel Tenold, upon your kind request in my neurologic clinic today for initial consultation of his sleep disorder, in particular, concern for obstructive sleep apnea in the context of recently abnormal overnight pulse oximetry test. The patient is unaccompanied today. As you know, Mr. Sydow is a 58 year old right-handed gentleman with a complex medical history of colon cancer, type 2 diabetes with renal complication, chronic kidney disease, hypertension, hx of TIA (transient L sided weakness and slurring of speech and L sided weakness x 5 minutes on 02/28/14, CTH neg., C.doppler from 2/15: Less than 50% stenosis in both ICA), atrial fibrillation, chronic congestive heart failure, hyperlipidemia, obesity, coronary artery disease, including MI, and status post ICD placement, and stent placement, who is reported to snore. He had an overnight pulse oximetry test on 03/02/2014 which I reviewed: Baseline oxygen saturation was 92.7%, nadir was 83%, time below 89% saturation was 6 minutes, time below 88% saturation was 5 minutes, average pulse rate was 72, maximum was 89, lowest was 42. Unfortunately, the graft does not show a continuous oxygen saturation graph. In fact, start time was 23:58 and end time 7:55, but valid read time was noted to be 2:35 hours, and he reports, that the sensor may have come off his finger and in the morning he put it back on. I think realistically he probably had a total of 2 hours and 20 minutes only of test time.   His typical bedtime is reported to be around 11 to MN and usual wake time is around 8 AM. Sleep onset typically occurs within a few minutes. He reports feeling adequately rested upon awakening. He wakes up on an average 1 times in the middle of  the night and has to go to the bathroom 0 to 1 times on a typical night. He denies morning headaches.  He denies frank excessive daytime somnolence (EDS) and His Epworth Sleepiness Score (ESS) is 8/24 today. He has not fallen asleep while driving. The patient has not been taking a planned nap, but is tired sometimes and may take a nap sometimes. He used to snore loud, but not his heart attack. There is no overt Hx of apneas or complaint of gasping for air.  with no nighttime cough experienced. The patient has not noted any RLS symptoms and is not known to kick while asleep or before falling asleep. There is no family history of RLS or OSA.  He denies cataplexy, sleep paralysis, hypnagogic or hypnopompic hallucinations, or sleep attacks. He does not report any vivid dreams, nightmares, dream enactments, or parasomnias, such as sleep talking or sleep walking. The patient has not had a sleep study or a home sleep test.  He consumes 1 caffeinated beverage per day, usually in the form of sweetened tea and [facility].   His bedroom is usually dark and cool. There is no TV in the bedroom.   His Past Medical History Is Significant For: Past Medical History  Diagnosis Date  . Essential hypertension, benign   . Type 2 diabetes mellitus   . Coronary atherosclerosis of native coronary artery     BMS to LAD 2001 at [facility], PTCA/atherectomy ramus and BMS to LAD 2009  . S/P colonoscopy     Normal  via ostomy - September 2009  . Chronic systolic heart failure   . Myocardial infarction, anterior wall     Treated with tPA at [facility] 2000  . GERD (gastroesophageal reflux disease)   . Cardiomyopathy, ischemic     BIV ICD St. Jude, LVEF 23%  . Paroxysmal atrial fibrillation     Amiodarone and Coumadin  . 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 Prostate cancer     s/p seed implants with chemo and radiation  . 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 Transaminitis 12/12/2013  . CHF  (congestive heart failure)   . TIA (transient ischemic attack)     His Past Surgical History Is Significant For: Past Surgical History  Procedure Laterality Date  . Internal defibrillator and pacemaker  2010    x2 St. Jude device  . Abdominal and perineal resection of rectum with total mesorectal excision      10/04/2007  . Colonoscopy      05/17/2007. IMPRESSION: Semilunar, apple-core neoplasm low in the rectum (palpable on digital rectal exam) beginning at 5 cm and corkscrewing up 5 cm in length. This was a low rectal lesion consistent with colorectal carcinoma. It was biosied multiple times. The upstream colon all the way to the cecum appeared normal. Recommendations: Followup on path. Surgical Consultation   . Colonoscopy  09/14/2011    Dr. Gala Romney: via colostomy, Single pedunculated benign inflammatory polyp. Due for surveillance Oct 2015  . Colostomy      His Family History Is Significant For: Family History  Problem Relation Age of Onset  . Colon cancer Mother 39  . Colon cancer Sister 5  . Coronary artery disease Father     His Social History Is Significant For: History   Social History  . Marital Status: Married    Spouse Name: N/A    Number of Children: 1  . Years of Education: N/A   Occupational History  .     Social History Main Topics  . Smoking status: Never Smoker   . Smokeless tobacco: Never Used  . Alcohol Use: No     Comment: Former user  . Drug Use: No  . Sexual Activity: No   Other Topics Concern  . None   Social History Narrative  . None    His Allergies Are:  No Known Allergies:   His Current Medications Are:  Outpatient Encounter Prescriptions as of 04/05/2014  Medication Sig  . amiodarone (PACERONE) 200 MG tablet Take 1 tablet (200 mg total) by mouth daily.  Marland Kitchen apixaban (ELIQUIS) 5 MG TABS tablet Take 5 mg by mouth 2 (two) times daily.  Marland Kitchen aspirin 81 MG EC tablet Take 81 mg by mouth at bedtime.   . citalopram (CELEXA) 20 MG tablet Take  10 mg by mouth daily.  Marland Kitchen co-enzyme Q-10 50 MG capsule Take 50 mg by mouth every morning.   . digoxin (LANOXIN) 0.125 MG tablet Take 0.125 mg by mouth every other day.   . enalapril (VASOTEC) 10 MG tablet Take 10 mg by mouth.  . fluticasone (FLONASE) 50 MCG/ACT nasal spray Place 1 spray into both nostrils daily as needed for allergies.   . furosemide (LASIX) 20 MG tablet Take 20 mg by mouth daily.   . insulin aspart (NOVOLOG) 100 UNIT/ML injection Inject 12-14 Units into the skin 3 (three) times daily as needed for high blood sugar. 14 units at breakfast and 12 units a lunch and 12 units at supper  per sliding scale   . insulin glargine (LANTUS) 100 UNIT/ML injection Inject 24 Units into the skin at bedtime.   . isosorbide dinitrate (ISORDIL) 30 MG tablet Take 15 mg by mouth 2 (two) times daily.  . isosorbide mononitrate (IMDUR) 30 MG 24 hr tablet Take 15 mg by mouth 2 (two) times daily.  . meclizine (ANTIVERT) 25 MG tablet Take 1 tablet (25 mg total) by mouth 3 (three) times daily as needed for dizziness.  . metFORMIN (GLUCOPHAGE) 500 MG tablet Take 500 mg by mouth 2 (two) times daily with a meal.  . metoprolol succinate (TOPROL-XL) 100 MG 24 hr tablet Take 100 mg by mouth daily. Take with or immediately following a meal.  . niacin (NIASPAN) 1000 MG CR tablet Take 1,000 mg by mouth at bedtime.    . Omega-3 Fatty Acids (FISH OIL) 1200 MG CAPS Take 1,200 mg by mouth 2 (two) times daily.    . pantoprazole (PROTONIX) 40 MG tablet Take 1 tablet (40 mg total) by mouth daily.  . ranolazine (RANEXA) 1000 MG SR tablet Take 1,000 mg by mouth 2 (two) times daily.  . rosuvastatin (CRESTOR) 40 MG tablet Take 40 mg by mouth at bedtime.  Marland Kitchen spironolactone (ALDACTONE) 25 MG tablet Take 1 tablet (25 mg total) by mouth daily.  . nitroGLYCERIN (NITROLINGUAL) 0.4 MG/SPRAY spray Place 1 spray under the tongue every 5 (five) minutes as needed. angina  . [DISCONTINUED] metoprolol succinate (TOPROL-XL) 25 MG 24 hr tablet  Take 25 mg by mouth daily.  :  Review of Systems:  Out of a complete 14 point review of systems, all are reviewed and negative with the exception of these symptoms as listed below:  Review of Systems  Constitutional: Positive for fatigue and unexpected weight change.  HENT: Positive for hearing loss, rhinorrhea and tinnitus.   Eyes: Negative.   Respiratory: Negative.   Cardiovascular: Negative.   Gastrointestinal: Negative.   Endocrine: Positive for polydipsia.  Genitourinary: Negative.   Musculoskeletal: Negative.   Skin: Negative.   Allergic/Immunologic: Negative.   Neurological: Positive for dizziness.  Hematological: Negative.   Psychiatric/Behavioral: Negative.     Objective:  Neurologic Exam  Physical Exam Physical Examination:   Filed Vitals:   04/05/14 0902  BP: 88/57  Pulse: 71  Temp: 96.6 F (35.9 C)    General Examination: The patient is a very pleasant 58 y.o. male in no acute distress. He appears well-developed and well-nourished and well groomed.   HEENT: Normocephalic, atraumatic, pupils are equal, round and reactive to light and accommodation. Funduscopic exam is normal with sharp disc margins noted. Extraocular tracking is good without limitation to gaze excursion or nystagmus noted. Normal smooth pursuit is noted. Hearing is grossly intact. Tympanic membranes are clear bilaterally. Face is symmetric with normal facial animation and normal facial sensation. Speech is clear with no dysarthria noted. There is no hypophonia. There is no lip, neck/head, jaw or voice tremor. Neck is supple with full range of passive and active motion. There are no carotid bruits on auscultation. Oropharynx exam reveals: moderate mouth dryness, poor dental hygiene with multiple missing teeth noted and moderate airway crowding, due to larger tongue and longer uvula. Mallampati is class I. Tongue protrudes centrally and palate elevates symmetrically. Tonsils are small. Neck size is  17.25 inches.   Chest: Clear to auscultation without wheezing, rhonchi or crackles noted.  Heart: S1+S2+0, regular and normal without murmurs, rubs or gallops noted.   Abdomen: Soft, non-tender and non-distended with normal bowel  sounds appreciated on auscultation.  Extremities: There is no pitting edema in the distal lower extremities bilaterally. Pedal pulses are intact.  Skin: Warm and dry without trophic changes noted. There are no varicose veins.  Musculoskeletal: exam reveals no obvious joint deformities, tenderness or joint swelling or erythema.   Neurologically:  Mental status: The patient is awake, alert and oriented in all 4 spheres. His immediate and remote memory, attention, language skills and fund of knowledge are appropriate. There is no evidence of aphasia, agnosia, apraxia or anomia. Speech is clear with normal prosody and enunciation. Thought process is linear. Mood is normal and affect is normal.  Cranial nerves II - XII are as described above under HEENT exam. In addition: shoulder shrug is normal with equal shoulder height noted. Motor exam: Normal bulk, strength and tone is noted. There is no drift, tremor or rebound. Romberg is negative. Reflexes are 2+ throughout. Babinski: Toes are flexor bilaterally. Fine motor skills and coordination: intact with normal finger taps, normal hand movements, normal rapid alternating patting, normal foot taps and normal foot agility.  Cerebellar testing: No dysmetria or intention tremor on finger to nose testing. Heel to shin is unremarkable bilaterally. There is no truncal or gait ataxia.  Sensory exam: intact to light touch, pinprick, vibration, temperature sense in the upper and lower extremities.  Gait, station and balance: He stands easily. No veering to one side is noted. No leaning to one side is noted. Posture is age-appropriate and stance is narrow based. Gait shows normal stride length and normal pace. No problems turning are  noted. He turns en bloc. Tandem walk is difficult for him and so are toe and heel stance.               Assessment and Plan:   In summary, Marqus Macphee Micco is a very pleasant 58 y.o.-year old male with a history and physical exam concerning for obstructive sleep apnea (OSA). While he does not have the tell-tale hx of OSA, he has multiple cardiovascular risk factors; combined with a possibly abnormal ONO. Especially, in light of his TIA and a fib Hx, we should get him check with a sleep study and have a lower threshold of treatment.  I had a long chat with the patient about my findings and the diagnosis of OSA, its prognosis and treatment options. We talked about medical treatments, surgical interventions and non-pharmacological approaches. I explained in particular the risks and ramifications of untreated moderate to severe OSA, especially with respect to developing cardiovascular disease down the Road, including congestive heart failure, difficult to treat hypertension, cardiac arrhythmias, or stroke. Even type 2 diabetes has, in part, been linked to untreated OSA. Symptoms of untreated OSA include daytime sleepiness, memory problems, mood irritability and mood disorder such as depression and anxiety, lack of energy, as well as recurrent headaches, especially morning headaches. We talked about trying to maintain a healthy lifestyle in general, as well as the importance of weight control. I encouraged the patient to eat healthy, exercise daily and keep well hydrated, to keep a scheduled bedtime and wake time routine, to not skip any meals and eat healthy snacks in between meals. I advised the patient not to drive when feeling sleepy. I recommended the following at this time: sleep study with potential positive airway pressure titration.  I explained the sleep test procedure to the patient and also outlined possible surgical and non-surgical treatment options of OSA, including the use of a custom-made dental  device (which  would require a referral to a specialist dentist or oral surgeon), upper airway surgical options, such as pillar implants, radiofrequency surgery, tongue base surgery, and UPPP (which would involve a referral to an ENT surgeon). Rarely, jaw surgery such as mandibular advancement may be considered. However, I also explained to him that realistically CPAP or any form of positive airway pressure treatment would really be the only treatment that may work for him. Of course he should work on weight loss. Surgical options are really not an option for him because he's not a good candidate for anesthesia given his congestive heart failure and heart disease. Dental options is limited for him because of poor dental hygiene and multiple missing teeth. I also explained the CPAP treatment option to the patient, who indicated that he would be willing to try CPAP if the need arises. I explained the importance of being compliant with PAP treatment, not only for insurance purposes but primarily to improve His symptoms, and for the patient's long term health benefit, including to reduce His cardiovascular risks. I answered all his questions today and the patient was in agreement. I would like to see him back after the sleep study is completed and encouraged him to call with any interim questions, concerns, problems or updates.   Thank you very much for allowing me to participate in the care of this nice patient. If I can be of any further assistance to you please do not hesitate to call me at [phone].  Sincerely,   Star Age, MD, PhD

## 2014-04-05 NOTE — Patient Instructions (Addendum)
Based on your symptoms and your exam I believe you are at risk for obstructive sleep apnea or OSA, and I think we should proceed with a sleep study to determine whether you do or do not have OSA and how severe it is. If you have more than mild OSA, I want you to consider treatment with CPAP. Please remember, the risks and ramifications of moderate to severe obstructive sleep apnea or OSA are: Cardiovascular disease, including congestive heart failure, stroke, difficult to control hypertension, arrhythmias, and even type 2 diabetes has been linked to untreated OSA. Sleep apnea causes disruption of sleep and sleep deprivation in most cases, which, in turn, can cause recurrent headaches, problems with memory, mood, concentration, focus, and vigilance. Most people with untreated sleep apnea report excessive daytime sleepiness, which can affect their ability to drive. Please do not drive if you feel sleepy.  I will see you back after your sleep study to go over the test results and where to go from there. We will call you after your sleep study and to set up an appointment at the time.   You have enough cardiovascular risk factors to justify doing a sleep study and treating you for sleep apnea with a lower treatment threshold.

## 2014-04-08 DIAGNOSIS — E119 Type 2 diabetes mellitus without complications: Secondary | ICD-10-CM | POA: Diagnosis not present

## 2014-04-08 DIAGNOSIS — Z8673 Personal history of transient ischemic attack (TIA), and cerebral infarction without residual deficits: Secondary | ICD-10-CM | POA: Diagnosis not present

## 2014-04-08 DIAGNOSIS — E785 Hyperlipidemia, unspecified: Secondary | ICD-10-CM | POA: Diagnosis not present

## 2014-04-08 DIAGNOSIS — I10 Essential (primary) hypertension: Secondary | ICD-10-CM | POA: Diagnosis not present

## 2014-04-08 DIAGNOSIS — Z6831 Body mass index (BMI) 31.0-31.9, adult: Secondary | ICD-10-CM | POA: Diagnosis not present

## 2014-04-08 DIAGNOSIS — R0989 Other specified symptoms and signs involving the circulatory and respiratory systems: Secondary | ICD-10-CM | POA: Diagnosis not present

## 2014-04-08 DIAGNOSIS — I2510 Atherosclerotic heart disease of native coronary artery without angina pectoris: Secondary | ICD-10-CM | POA: Diagnosis not present

## 2014-04-08 DIAGNOSIS — I951 Orthostatic hypotension: Secondary | ICD-10-CM | POA: Diagnosis not present

## 2014-04-08 DIAGNOSIS — R42 Dizziness and giddiness: Secondary | ICD-10-CM | POA: Diagnosis not present

## 2014-04-08 DIAGNOSIS — R0609 Other forms of dyspnea: Secondary | ICD-10-CM | POA: Diagnosis not present

## 2014-04-08 DIAGNOSIS — I5022 Chronic systolic (congestive) heart failure: Secondary | ICD-10-CM | POA: Diagnosis not present

## 2014-04-08 DIAGNOSIS — I4891 Unspecified atrial fibrillation: Secondary | ICD-10-CM | POA: Diagnosis not present

## 2014-04-08 DIAGNOSIS — Z85038 Personal history of other malignant neoplasm of large intestine: Secondary | ICD-10-CM | POA: Diagnosis not present

## 2014-04-08 DIAGNOSIS — Z95810 Presence of automatic (implantable) cardiac defibrillator: Secondary | ICD-10-CM | POA: Diagnosis not present

## 2014-04-08 DIAGNOSIS — I2589 Other forms of chronic ischemic heart disease: Secondary | ICD-10-CM | POA: Diagnosis not present

## 2014-04-08 DIAGNOSIS — I509 Heart failure, unspecified: Secondary | ICD-10-CM | POA: Diagnosis not present

## 2014-04-08 DIAGNOSIS — E669 Obesity, unspecified: Secondary | ICD-10-CM | POA: Diagnosis not present

## 2014-05-10 ENCOUNTER — Ambulatory Visit (INDEPENDENT_AMBULATORY_CARE_PROVIDER_SITE_OTHER): Payer: Medicare Other | Admitting: Neurology

## 2014-05-10 ENCOUNTER — Other Ambulatory Visit: Payer: Self-pay | Admitting: *Deleted

## 2014-05-10 ENCOUNTER — Other Ambulatory Visit: Payer: Self-pay | Admitting: Cardiovascular Disease

## 2014-05-10 DIAGNOSIS — I509 Heart failure, unspecified: Secondary | ICD-10-CM

## 2014-05-10 DIAGNOSIS — G4733 Obstructive sleep apnea (adult) (pediatric): Secondary | ICD-10-CM | POA: Diagnosis not present

## 2014-05-10 DIAGNOSIS — G459 Transient cerebral ischemic attack, unspecified: Secondary | ICD-10-CM

## 2014-05-10 DIAGNOSIS — I219 Acute myocardial infarction, unspecified: Secondary | ICD-10-CM

## 2014-05-10 DIAGNOSIS — I4891 Unspecified atrial fibrillation: Secondary | ICD-10-CM

## 2014-05-10 DIAGNOSIS — E119 Type 2 diabetes mellitus without complications: Secondary | ICD-10-CM

## 2014-05-13 ENCOUNTER — Other Ambulatory Visit: Payer: Self-pay | Admitting: *Deleted

## 2014-05-13 MED ORDER — CITALOPRAM HYDROBROMIDE 20 MG PO TABS: 20.0000 mg | ORAL_TABLET | Freq: Every day | ORAL | Status: DC

## 2014-05-13 MED ORDER — RANOLAZINE ER 1000 MG PO TB12: 1000.0000 mg | ORAL_TABLET | Freq: Two times a day (BID) | ORAL | Status: DC

## 2014-05-13 NOTE — Telephone Encounter (Signed)
Spoke with patient's wife. He wishes to keep Dr. Gwenlyn Found as primary cardiologist. Citalopram refilled (had been refilled in Dec 2014 by Dr. Gwenlyn Found) and instructed wife to cancel appmts as necessary since had 2 in same week with different providers at different offices.

## 2014-05-13 NOTE — Telephone Encounter (Signed)
Refill for citalopram refused - defer to Dr. Zandra Abts or PCP Left VM for patient with this information and reminded him that he has OV with Dr. Harl Bowie on 6/16 and a visit with Lurena Joiner, [place] on 6/18 (so he should likely cancel 6/18 appmt) since he is being seen in [place].

## 2014-05-15 DIAGNOSIS — C61 Malignant neoplasm of prostate: Secondary | ICD-10-CM | POA: Diagnosis not present

## 2014-05-17 ENCOUNTER — Other Ambulatory Visit: Payer: Self-pay | Admitting: *Deleted

## 2014-05-17 ENCOUNTER — Ambulatory Visit (INDEPENDENT_AMBULATORY_CARE_PROVIDER_SITE_OTHER): Payer: Medicare Other | Admitting: *Deleted

## 2014-05-17 ENCOUNTER — Ambulatory Visit: Payer: Medicare Other | Admitting: Oncology

## 2014-05-17 ENCOUNTER — Telehealth: Payer: Self-pay | Admitting: Internal Medicine

## 2014-05-17 ENCOUNTER — Other Ambulatory Visit: Payer: Medicare Other

## 2014-05-17 DIAGNOSIS — I5023 Acute on chronic systolic (congestive) heart failure: Secondary | ICD-10-CM

## 2014-05-17 DIAGNOSIS — I2589 Other forms of chronic ischemic heart disease: Secondary | ICD-10-CM | POA: Diagnosis not present

## 2014-05-17 DIAGNOSIS — I4892 Unspecified atrial flutter: Secondary | ICD-10-CM

## 2014-05-17 LAB — MDC_IDC_ENUM_SESS_TYPE_INCLINIC
Battery Remaining Longevity: 42 mo
Battery Voltage: 2.57 V
Brady Statistic RA Percent Paced: 12 %
Brady Statistic RV Percent Paced: 78 %
Date Time Interrogation Session: 20150612132259
HIGH POWER IMPEDANCE MEASURED VALUE: 44 Ohm
HighPow Impedance: 44.4418
Lead Channel Impedance Value: 387.5 Ohm
Lead Channel Pacing Threshold Amplitude: 0.75 V
Lead Channel Pacing Threshold Amplitude: 1.25 V
Lead Channel Pacing Threshold Amplitude: 1.25 V
Lead Channel Pacing Threshold Pulse Width: 0.5 ms
Lead Channel Pacing Threshold Pulse Width: 0.5 ms
Lead Channel Pacing Threshold Pulse Width: 0.5 ms
Lead Channel Sensing Intrinsic Amplitude: 7.5 mV
Lead Channel Setting Pacing Amplitude: 2 V
Lead Channel Setting Pacing Amplitude: 2 V
Lead Channel Setting Pacing Pulse Width: 0.5 ms
Lead Channel Setting Pacing Pulse Width: 0.5 ms
MDC IDC MSMT LEADCHNL LV IMPEDANCE VALUE: 437.5 Ohm
MDC IDC MSMT LEADCHNL LV PACING THRESHOLD AMPLITUDE: 0.75 V
MDC IDC MSMT LEADCHNL LV PACING THRESHOLD PULSEWIDTH: 0.5 ms
MDC IDC MSMT LEADCHNL RA SENSING INTR AMPL: 2.8 mV
MDC IDC MSMT LEADCHNL RV IMPEDANCE VALUE: 650 Ohm
MDC IDC PG SERIAL: 668098
MDC IDC SET LEADCHNL RV PACING AMPLITUDE: 2.5 V
MDC IDC SET LEADCHNL RV SENSING SENSITIVITY: 0.3 mV
MDC IDC SET ZONE DETECTION INTERVAL: 400 ms
Zone Setting Detection Interval: 270 ms
Zone Setting Detection Interval: 320 ms

## 2014-05-17 MED ORDER — DIGOXIN 125 MCG PO TABS: 0.1250 mg | ORAL_TABLET | ORAL | Status: DC

## 2014-05-17 MED ORDER — AMIODARONE HCL 200 MG PO TABS: 200.0000 mg | ORAL_TABLET | Freq: Every day | ORAL | Status: DC

## 2014-05-17 NOTE — Telephone Encounter (Signed)
Medication sent to pharmacy  

## 2014-05-17 NOTE — Telephone Encounter (Signed)
Received fax refill request  Rx # [id number] Medication:  Amiodarone HCL 200 mg tablet Qty 30 Sig:  Take one tablet by mouth once daily Physician:  Lovena Le

## 2014-05-17 NOTE — Progress Notes (Signed)
ICD check in office. 

## 2014-05-19 ENCOUNTER — Emergency Department (HOSPITAL_COMMUNITY): Payer: Medicare Other

## 2014-05-19 ENCOUNTER — Encounter (HOSPITAL_COMMUNITY): Payer: Self-pay | Admitting: Emergency Medicine

## 2014-05-19 ENCOUNTER — Inpatient Hospital Stay (HOSPITAL_COMMUNITY)
Admission: EM | Admit: 2014-05-19 | Discharge: 2014-05-23 | DRG: 309 | Disposition: A | Discharge status: Home / Self Care | Payer: Medicare Other | Attending: Internal Medicine | Admitting: Internal Medicine

## 2014-05-19 ENCOUNTER — Observation Stay (HOSPITAL_COMMUNITY): Payer: Medicare Other

## 2014-05-19 DIAGNOSIS — R9431 Abnormal electrocardiogram [ECG] [EKG]: Secondary | ICD-10-CM

## 2014-05-19 DIAGNOSIS — B349 Viral infection, unspecified: Secondary | ICD-10-CM

## 2014-05-19 DIAGNOSIS — Z923 Personal history of irradiation: Secondary | ICD-10-CM

## 2014-05-19 DIAGNOSIS — R0602 Shortness of breath: Secondary | ICD-10-CM

## 2014-05-19 DIAGNOSIS — Z9049 Acquired absence of other specified parts of digestive tract: Secondary | ICD-10-CM | POA: Diagnosis not present

## 2014-05-19 DIAGNOSIS — Z800 Family history of malignant neoplasm of digestive organs: Secondary | ICD-10-CM

## 2014-05-19 DIAGNOSIS — I959 Hypotension, unspecified: Secondary | ICD-10-CM

## 2014-05-19 DIAGNOSIS — E861 Hypovolemia: Secondary | ICD-10-CM

## 2014-05-19 DIAGNOSIS — I5023 Acute on chronic systolic (congestive) heart failure: Secondary | ICD-10-CM

## 2014-05-19 DIAGNOSIS — Z85048 Personal history of other malignant neoplasm of rectum, rectosigmoid junction, and anus: Secondary | ICD-10-CM | POA: Diagnosis not present

## 2014-05-19 DIAGNOSIS — E669 Obesity, unspecified: Secondary | ICD-10-CM

## 2014-05-19 DIAGNOSIS — N179 Acute kidney failure, unspecified: Secondary | ICD-10-CM

## 2014-05-19 DIAGNOSIS — E1129 Type 2 diabetes mellitus with other diabetic kidney complication: Secondary | ICD-10-CM | POA: Diagnosis not present

## 2014-05-19 DIAGNOSIS — R0989 Other specified symptoms and signs involving the circulatory and respiratory systems: Secondary | ICD-10-CM

## 2014-05-19 DIAGNOSIS — Z794 Long term (current) use of insulin: Secondary | ICD-10-CM

## 2014-05-19 DIAGNOSIS — I10 Essential (primary) hypertension: Secondary | ICD-10-CM | POA: Diagnosis not present

## 2014-05-19 DIAGNOSIS — K5900 Constipation, unspecified: Secondary | ICD-10-CM | POA: Diagnosis not present

## 2014-05-19 DIAGNOSIS — K8020 Calculus of gallbladder without cholecystitis without obstruction: Secondary | ICD-10-CM | POA: Diagnosis not present

## 2014-05-19 DIAGNOSIS — Z6831 Body mass index (BMI) 31.0-31.9, adult: Secondary | ICD-10-CM

## 2014-05-19 DIAGNOSIS — I472 Ventricular tachycardia, unspecified: Secondary | ICD-10-CM | POA: Diagnosis not present

## 2014-05-19 DIAGNOSIS — J111 Influenza due to unidentified influenza virus with other respiratory manifestations: Secondary | ICD-10-CM

## 2014-05-19 DIAGNOSIS — F3289 Other specified depressive episodes: Secondary | ICD-10-CM | POA: Diagnosis present

## 2014-05-19 DIAGNOSIS — Z95810 Presence of automatic (implantable) cardiac defibrillator: Secondary | ICD-10-CM | POA: Diagnosis not present

## 2014-05-19 DIAGNOSIS — N182 Chronic kidney disease, stage 2 (mild): Secondary | ICD-10-CM | POA: Diagnosis present

## 2014-05-19 DIAGNOSIS — I252 Old myocardial infarction: Secondary | ICD-10-CM | POA: Diagnosis not present

## 2014-05-19 DIAGNOSIS — R7401 Elevation of levels of liver transaminase levels: Secondary | ICD-10-CM

## 2014-05-19 DIAGNOSIS — I129 Hypertensive chronic kidney disease with stage 1 through stage 4 chronic kidney disease, or unspecified chronic kidney disease: Secondary | ICD-10-CM | POA: Diagnosis present

## 2014-05-19 DIAGNOSIS — E1165 Type 2 diabetes mellitus with hyperglycemia: Secondary | ICD-10-CM

## 2014-05-19 DIAGNOSIS — I480 Paroxysmal atrial fibrillation: Secondary | ICD-10-CM

## 2014-05-19 DIAGNOSIS — I4891 Unspecified atrial fibrillation: Secondary | ICD-10-CM

## 2014-05-19 DIAGNOSIS — I509 Heart failure, unspecified: Secondary | ICD-10-CM | POA: Diagnosis not present

## 2014-05-19 DIAGNOSIS — G459 Transient cerebral ischemic attack, unspecified: Secondary | ICD-10-CM

## 2014-05-19 DIAGNOSIS — Z9861 Coronary angioplasty status: Secondary | ICD-10-CM

## 2014-05-19 DIAGNOSIS — Z7982 Long term (current) use of aspirin: Secondary | ICD-10-CM

## 2014-05-19 DIAGNOSIS — N189 Chronic kidney disease, unspecified: Secondary | ICD-10-CM | POA: Diagnosis not present

## 2014-05-19 DIAGNOSIS — R42 Dizziness and giddiness: Secondary | ICD-10-CM

## 2014-05-19 DIAGNOSIS — I5022 Chronic systolic (congestive) heart failure: Secondary | ICD-10-CM

## 2014-05-19 DIAGNOSIS — G4733 Obstructive sleep apnea (adult) (pediatric): Secondary | ICD-10-CM | POA: Diagnosis present

## 2014-05-19 DIAGNOSIS — E871 Hypo-osmolality and hyponatremia: Secondary | ICD-10-CM | POA: Diagnosis present

## 2014-05-19 DIAGNOSIS — Z8249 Family history of ischemic heart disease and other diseases of the circulatory system: Secondary | ICD-10-CM | POA: Diagnosis not present

## 2014-05-19 DIAGNOSIS — I219 Acute myocardial infarction, unspecified: Secondary | ICD-10-CM

## 2014-05-19 DIAGNOSIS — I4892 Unspecified atrial flutter: Secondary | ICD-10-CM | POA: Diagnosis present

## 2014-05-19 DIAGNOSIS — R740 Nonspecific elevation of levels of transaminase and lactic acid dehydrogenase [LDH]: Secondary | ICD-10-CM

## 2014-05-19 DIAGNOSIS — Z933 Colostomy status: Secondary | ICD-10-CM

## 2014-05-19 DIAGNOSIS — F329 Major depressive disorder, single episode, unspecified: Secondary | ICD-10-CM | POA: Diagnosis present

## 2014-05-19 DIAGNOSIS — Z7901 Long term (current) use of anticoagulants: Secondary | ICD-10-CM

## 2014-05-19 DIAGNOSIS — R0609 Other forms of dyspnea: Secondary | ICD-10-CM

## 2014-05-19 DIAGNOSIS — E875 Hyperkalemia: Secondary | ICD-10-CM

## 2014-05-19 DIAGNOSIS — K824 Cholesterolosis of gallbladder: Secondary | ICD-10-CM

## 2014-05-19 DIAGNOSIS — Z9221 Personal history of antineoplastic chemotherapy: Secondary | ICD-10-CM | POA: Diagnosis not present

## 2014-05-19 DIAGNOSIS — R0600 Dyspnea, unspecified: Secondary | ICD-10-CM

## 2014-05-19 DIAGNOSIS — J9601 Acute respiratory failure with hypoxia: Secondary | ICD-10-CM

## 2014-05-19 DIAGNOSIS — K219 Gastro-esophageal reflux disease without esophagitis: Secondary | ICD-10-CM | POA: Diagnosis present

## 2014-05-19 DIAGNOSIS — I498 Other specified cardiac arrhythmias: Secondary | ICD-10-CM | POA: Diagnosis present

## 2014-05-19 DIAGNOSIS — J189 Pneumonia, unspecified organism: Secondary | ICD-10-CM

## 2014-05-19 DIAGNOSIS — E119 Type 2 diabetes mellitus without complications: Secondary | ICD-10-CM

## 2014-05-19 DIAGNOSIS — Z8546 Personal history of malignant neoplasm of prostate: Secondary | ICD-10-CM

## 2014-05-19 DIAGNOSIS — IMO0002 Reserved for concepts with insufficient information to code with codable children: Secondary | ICD-10-CM | POA: Diagnosis not present

## 2014-05-19 DIAGNOSIS — I200 Unstable angina: Secondary | ICD-10-CM

## 2014-05-19 DIAGNOSIS — I249 Acute ischemic heart disease, unspecified: Secondary | ICD-10-CM

## 2014-05-19 DIAGNOSIS — Z79899 Other long term (current) drug therapy: Secondary | ICD-10-CM

## 2014-05-19 DIAGNOSIS — N289 Disorder of kidney and ureter, unspecified: Secondary | ICD-10-CM

## 2014-05-19 DIAGNOSIS — I255 Ischemic cardiomyopathy: Secondary | ICD-10-CM | POA: Diagnosis present

## 2014-05-19 DIAGNOSIS — Z8673 Personal history of transient ischemic attack (TIA), and cerebral infarction without residual deficits: Secondary | ICD-10-CM | POA: Diagnosis not present

## 2014-05-19 DIAGNOSIS — I4729 Other ventricular tachycardia: Principal | ICD-10-CM | POA: Diagnosis present

## 2014-05-19 DIAGNOSIS — R112 Nausea with vomiting, unspecified: Secondary | ICD-10-CM

## 2014-05-19 DIAGNOSIS — R109 Unspecified abdominal pain: Secondary | ICD-10-CM | POA: Diagnosis not present

## 2014-05-19 DIAGNOSIS — R69 Illness, unspecified: Secondary | ICD-10-CM

## 2014-05-19 DIAGNOSIS — I2589 Other forms of chronic ischemic heart disease: Secondary | ICD-10-CM | POA: Diagnosis present

## 2014-05-19 DIAGNOSIS — I2510 Atherosclerotic heart disease of native coronary artery without angina pectoris: Secondary | ICD-10-CM | POA: Diagnosis not present

## 2014-05-19 DIAGNOSIS — C20 Malignant neoplasm of rectum: Secondary | ICD-10-CM

## 2014-05-19 DIAGNOSIS — E785 Hyperlipidemia, unspecified: Secondary | ICD-10-CM

## 2014-05-19 LAB — GLUCOSE, CAPILLARY
Glucose-Capillary: 274 mg/dL — ABNORMAL HIGH (ref 70–99)
Glucose-Capillary: 265 mg/dL — ABNORMAL HIGH (ref 70–99)
Glucose-Capillary: 234 mg/dL — ABNORMAL HIGH (ref 70–99)
Glucose-Capillary: 217 mg/dL — ABNORMAL HIGH (ref 70–99)

## 2014-05-19 LAB — CBC WITH DIFFERENTIAL/PLATELET
Basophils Absolute: 0 10*3/uL (ref 0.0–0.1)
Basophils Relative: 0 % (ref 0–1)
Eosinophils Absolute: 0.1 10*3/uL (ref 0.0–0.7)
Eosinophils Relative: 1 % (ref 0–5)
HCT: 37.2 % — ABNORMAL LOW (ref 39.0–52.0)
Hemoglobin: 12.6 g/dL — ABNORMAL LOW (ref 13.0–17.0)
Lymphocytes Relative: 6 % — ABNORMAL LOW (ref 12–46)
Lymphs Abs: 0.7 10*3/uL (ref 0.7–4.0)
MCH: 28.6 pg (ref 26.0–34.0)
MCHC: 33.9 g/dL (ref 30.0–36.0)
MCV: 84.4 fL (ref 78.0–100.0)
Monocytes Absolute: 0.6 10*3/uL (ref 0.1–1.0)
Monocytes Relative: 6 % (ref 3–12)
Neutro Abs: 9.7 10*3/uL — ABNORMAL HIGH (ref 1.7–7.7)
Neutrophils Relative %: 87 % — ABNORMAL HIGH (ref 43–77)
Platelets: 150 10*3/uL (ref 150–400)
RBC: 4.41 MIL/uL (ref 4.22–5.81)
RDW: 17.3 % — AB (ref 11.5–15.5)
WBC: 11.1 10*3/uL — ABNORMAL HIGH (ref 4.0–10.5)

## 2014-05-19 LAB — URINALYSIS, ROUTINE W REFLEX MICROSCOPIC
Bilirubin Urine: NEGATIVE
Glucose, UA: 500 mg/dL — AB
Hgb urine dipstick: NEGATIVE
KETONES UR: NEGATIVE mg/dL
LEUKOCYTES UA: NEGATIVE
NITRITE: NEGATIVE
PH: 5.5 (ref 5.0–8.0)
Protein, ur: NEGATIVE mg/dL
Specific Gravity, Urine: 1.025 (ref 1.005–1.030)
Urobilinogen, UA: 1 mg/dL (ref 0.0–1.0)

## 2014-05-19 LAB — TROPONIN I
Troponin I: <0.3 ng/mL (ref <0.30)
Troponin I: <0.3 ng/mL (ref <0.30)

## 2014-05-19 LAB — PROTIME-INR
INR: 1.61 — ABNORMAL HIGH (ref 0.00–1.49)
Prothrombin Time: 18.7 seconds — ABNORMAL HIGH (ref 11.6–15.2)

## 2014-05-19 LAB — BASIC METABOLIC PANEL
BUN: 30 mg/dL — ABNORMAL HIGH (ref 6–23)
CHLORIDE: 94 meq/L — AB (ref 96–112)
CO2: 22 meq/L (ref 19–32)
Calcium: 9 mg/dL (ref 8.4–10.5)
Creatinine, Ser: 1.7 mg/dL — ABNORMAL HIGH (ref 0.50–1.35)
GFR calc Af Amer: 49 mL/min — ABNORMAL LOW (ref >90)
GFR calc non Af Amer: 42 mL/min — ABNORMAL LOW (ref >90)
Glucose, Bld: 347 mg/dL — ABNORMAL HIGH (ref 70–99)
Potassium: 4.7 mEq/L (ref 3.7–5.3)
Sodium: 132 mEq/L — ABNORMAL LOW (ref 137–147)

## 2014-05-19 LAB — DIGOXIN LEVEL: Digoxin Level: <0.3 ng/mL — ABNORMAL LOW (ref 0.8–2.0)

## 2014-05-19 LAB — PRO B NATRIURETIC PEPTIDE: Pro B Natriuretic peptide (BNP): 2631 pg/mL — ABNORMAL HIGH (ref 0–125)

## 2014-05-19 LAB — APTT: APTT: 36 s (ref 24–37)

## 2014-05-19 MED ORDER — APIXABAN 5 MG PO TABS
5.0000 mg | ORAL_TABLET | Freq: Two times a day (BID) | ORAL | Status: DC
  Administered 2014-05-19 – 2014-05-23 (×9): 5 mg via ORAL
  Filled 2014-05-19 (×9): qty 1

## 2014-05-19 MED ORDER — METOPROLOL SUCCINATE ER 50 MG PO TB24
100.0000 mg | ORAL_TABLET | Freq: Every day | ORAL | Status: DC
  Administered 2014-05-19 – 2014-05-21 (×3): 100 mg via ORAL
  Filled 2014-05-19 (×3): qty 2

## 2014-05-19 MED ORDER — INSULIN ASPART 100 UNIT/ML ~~LOC~~ SOLN
12.0000 [IU] | Freq: Every day | SUBCUTANEOUS | Status: DC
  Administered 2014-05-19 – 2014-05-22 (×4): 12 [IU] via SUBCUTANEOUS

## 2014-05-19 MED ORDER — FLUTICASONE PROPIONATE 50 MCG/ACT NA SUSP: 1.0000 | Freq: Every day | NASAL | Status: DC | PRN

## 2014-05-19 MED ORDER — INSULIN GLARGINE 100 UNIT/ML ~~LOC~~ SOLN
24.0000 [IU] | Freq: Every day | SUBCUTANEOUS | Status: DC
  Administered 2014-05-19 – 2014-05-22 (×4): 24 [IU] via SUBCUTANEOUS
  Filled 2014-05-19 (×4): qty 0.24

## 2014-05-19 MED ORDER — FUROSEMIDE 10 MG/ML IJ SOLN
80.0000 mg | Freq: Once | INTRAMUSCULAR | Status: AC
  Administered 2014-05-19: 80 mg via INTRAVENOUS
  Filled 2014-05-19: qty 8

## 2014-05-19 MED ORDER — ISOSORBIDE DINITRATE 10 MG PO TABS
15.0000 mg | ORAL_TABLET | Freq: Two times a day (BID) | ORAL | Status: DC
  Administered 2014-05-19 – 2014-05-21 (×5): 15 mg via ORAL
  Filled 2014-05-19 (×2): qty 1
  Filled 2014-05-19: qty 2
  Filled 2014-05-19 (×5): qty 1

## 2014-05-19 MED ORDER — SPIRONOLACTONE 25 MG PO TABS
25.0000 mg | ORAL_TABLET | Freq: Every day | ORAL | Status: DC
  Administered 2014-05-19 – 2014-05-20 (×2): 25 mg via ORAL
  Filled 2014-05-19 (×3): qty 1

## 2014-05-19 MED ORDER — MECLIZINE HCL 12.5 MG PO TABS: 25.0000 mg | ORAL_TABLET | Freq: Three times a day (TID) | ORAL | Status: DC | PRN

## 2014-05-19 MED ORDER — FUROSEMIDE 20 MG PO TABS
20.0000 mg | ORAL_TABLET | Freq: Every day | ORAL | Status: DC
  Administered 2014-05-19 – 2014-05-20 (×2): 20 mg via ORAL
  Filled 2014-05-19 (×3): qty 1

## 2014-05-19 MED ORDER — PANTOPRAZOLE SODIUM 40 MG PO TBEC
40.0000 mg | DELAYED_RELEASE_TABLET | Freq: Every day | ORAL | Status: DC
  Administered 2014-05-19 – 2014-05-23 (×5): 40 mg via ORAL
  Filled 2014-05-19 (×5): qty 1

## 2014-05-19 MED ORDER — ASPIRIN 81 MG PO CHEW
324.0000 mg | CHEWABLE_TABLET | Freq: Once | ORAL | Status: AC
  Administered 2014-05-19: 324 mg via ORAL
  Filled 2014-05-19: qty 4

## 2014-05-19 MED ORDER — AMIODARONE HCL 200 MG PO TABS
200.0000 mg | ORAL_TABLET | Freq: Every day | ORAL | Status: DC
  Administered 2014-05-19 – 2014-05-20 (×2): 200 mg via ORAL
  Filled 2014-05-19 (×2): qty 1

## 2014-05-19 MED ORDER — RANOLAZINE ER 500 MG PO TB12
1000.0000 mg | ORAL_TABLET | Freq: Two times a day (BID) | ORAL | Status: DC
  Administered 2014-05-19 – 2014-05-21 (×5): 1000 mg via ORAL
  Filled 2014-05-19 (×7): qty 2

## 2014-05-19 MED ORDER — DIGOXIN 125 MCG PO TABS
0.1250 mg | ORAL_TABLET | ORAL | Status: DC
  Administered 2014-05-19 – 2014-05-23 (×3): 0.125 mg via ORAL
  Filled 2014-05-19 (×5): qty 1

## 2014-05-19 MED ORDER — ONDANSETRON HCL 4 MG/2ML IJ SOLN
4.0000 mg | Freq: Four times a day (QID) | INTRAMUSCULAR | Status: DC | PRN
  Administered 2014-05-19: 4 mg via INTRAVENOUS
  Filled 2014-05-19: qty 2

## 2014-05-19 MED ORDER — ENALAPRIL MALEATE 5 MG PO TABS
10.0000 mg | ORAL_TABLET | Freq: Every day | ORAL | Status: DC
  Administered 2014-05-19 – 2014-05-21 (×3): 10 mg via ORAL
  Filled 2014-05-19 (×3): qty 2

## 2014-05-19 MED ORDER — TRAMADOL HCL 50 MG PO TABS
50.0000 mg | ORAL_TABLET | Freq: Once | ORAL | Status: AC
  Administered 2014-05-19: 50 mg via ORAL
  Filled 2014-05-19: qty 1

## 2014-05-19 MED ORDER — INSULIN ASPART 100 UNIT/ML ~~LOC~~ SOLN
14.0000 [IU] | Freq: Two times a day (BID) | SUBCUTANEOUS | Status: DC
  Administered 2014-05-20 – 2014-05-23 (×6): 14 [IU] via SUBCUTANEOUS

## 2014-05-19 MED ORDER — ISOSORBIDE DINITRATE 5 MG PO TABS: 15.0000 mg | ORAL_TABLET | Freq: Two times a day (BID) | ORAL | Status: DC

## 2014-05-19 MED ORDER — CITALOPRAM HYDROBROMIDE 20 MG PO TABS
20.0000 mg | ORAL_TABLET | Freq: Every day | ORAL | Status: DC
  Administered 2014-05-19 – 2014-05-23 (×5): 20 mg via ORAL
  Filled 2014-05-19 (×5): qty 1

## 2014-05-19 MED ORDER — ASPIRIN EC 81 MG PO TBEC
81.0000 mg | DELAYED_RELEASE_TABLET | Freq: Every day | ORAL | Status: DC
  Administered 2014-05-19 – 2014-05-22 (×4): 81 mg via ORAL
  Filled 2014-05-19 (×5): qty 1

## 2014-05-19 MED ORDER — NIACIN ER (ANTIHYPERLIPIDEMIC) 500 MG PO TBCR
1000.0000 mg | EXTENDED_RELEASE_TABLET | Freq: Every day | ORAL | Status: DC
  Administered 2014-05-19 – 2014-05-22 (×4): 1000 mg via ORAL
  Filled 2014-05-19 (×5): qty 2

## 2014-05-19 NOTE — Progress Notes (Signed)
10:22 AM I agree with HPI/GPe and A/P per Dr. Shanon Brow  58 y/o ?, known ?OSA, rectal cancer  2008 T3, N2-Rx Chemo [folfox]-radiation and colectomy with resultant ostomy, Ty 2 DM with renal complications, Transaminitis, h/o TIA 02/28/14, Chronic Afib/flutter s/p AICD 2002/upgrade to Biv ICD 2009, CHF class 2, HLD, Obesity, Cad c prior BMS lad 12/1999-, NSTEMI 09/27/08 with stent  with Vtach-5 shocks Medical managament 07/2013 tight non-dom RCA 07/2013-started renexa 100 bid at the time   Noted with vague symptoms including a cough, abdominal pain and not passing stool for the past 3 days where he normally has liquid stool. He had one episode of vomiting 6/13 p.m. and decided to come to the emergency room for workup and was admitted for shortness of breath.   He is doing fine now has no abdominal pain no nausea no vomiting no other issues. He's tolerating diet  Patient Active Problem List   Diagnosis Date Noted  . ACS (acute coronary syndrome) 05/19/2014  . N&V (nausea and vomiting) 03/07/2014  . Gallbladder polyp 03/06/2014  . TIA (transient ischemic attack) 03/01/2014  . DM type 2, uncontrolled, with renal complications 96/03/5408  . Acute-on-chronic renal failure 01/26/2014  . Dizziness 01/26/2014  . Obesity 12/12/2013  . Influenza-like illness 12/12/2013  . Hypovolemia 12/12/2013  . Atypical pneumonia 12/12/2013  . Transaminitis 12/12/2013  . Hypotension 12/11/2013  .  CKD III 12/11/2013  . Acute viral syndrome 12/11/2013  . Atrial flutter 10/24/2013  . Atrial fibrillation with RVR 10/23/2013  . Chronic systolic heart failure 81/19/1478  . Acute respiratory failure with hypoxia 07/29/2013  . Acute on chronic clinical systolic heart failure 29/56/2130  . Dyslipidemia- (LDL 160, previously in the 50s) 07/11/2013  . SOB (shortness of breath) 07/10/2013  . Hyperkalemia 07/10/2013  . Renal insufficiency- appears to be new 07/10/2013  . Unstable angina 07/10/2013  . CAD with LCX disease and  stents to LAD 07/10/2013  . PAF (paroxysmal atrial fibrillation) 03/23/2013  . Long term (current) use of anticoagulants 03/23/2013  . Family history of colon cancer 08/17/2011  . ADENOCARCINOMA, RECTUM radiation/ chemo/ surg 2008 06/20/2008  . DIABETES MELLITUS 06/20/2008  . HYPERTENSION 06/20/2008  . MYOCARDIAL INFARCTION 06/20/2008  . CARDIOMYOPATHY, ISCHEMIC, with BiV ICD, st Jude EF 23% 06/20/2008   We will rule out any obstructive pathology to his stomach as he usually has passage of stool through the ostomy and it would be unusual for him to have an obstruction although possible. He does not have any pain right now. Please see history of present illness for the rest of his plan of care  [name], MD [facility] [phone]

## 2014-05-19 NOTE — H&P (Signed)
PCP:   Glo Herring., MD   Chief Complaint:  sob  HPI: 58 yo male h/o CAD, systolic chf with ef about 20%, htn, aicd comes in with sob earlier tonight.  No cough.  No fevers.  No swelling.  Wt he reports has been good.  No pnd or orthopnea.  No chest pain.  Does not require oxygen at home.  With his previous heart attacks he had chest pressure and sob.  His sob has resolved since arrival.  Asked to obs for concern of anginal equivalent.    Review of Systems:  Positive and negative as per HPI otherwise all other systems are negative  Past Medical History: Past Medical History  Diagnosis Date  . Essential hypertension, benign   . Type 2 diabetes mellitus   . Coronary atherosclerosis of native coronary artery     BMS to LAD 2001 at [facility], PTCA/atherectomy ramus and BMS to LAD 2009  . S/P colonoscopy     Normal via ostomy - September 2009  . Chronic systolic heart failure   . Myocardial infarction, anterior wall     Treated with tPA at [facility] 2000  . GERD (gastroesophageal reflux disease)   . Cardiomyopathy, ischemic     BIV ICD St. Jude, LVEF 23%  . Paroxysmal atrial fibrillation     Amiodarone and Coumadin  . 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 Prostate cancer     s/p seed implants with chemo and radiation  . 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 Transaminitis 12/12/2013  . CHF (congestive heart failure)   . TIA (transient ischemic attack)    Past Surgical History  Procedure Laterality Date  . Internal defibrillator and pacemaker  2010    x2 St. Jude device  . Abdominal and perineal resection of rectum with total mesorectal excision      10/04/2007  . Colonoscopy      05/17/2007. IMPRESSION: Semilunar, apple-core neoplasm low in the rectum (palpable on digital rectal exam) beginning at 5 cm and corkscrewing up 5 cm in length. This was a low rectal lesion consistent with colorectal carcinoma. It was biosied multiple times. The upstream  colon all the way to the cecum appeared normal. Recommendations: Followup on path. Surgical Consultation   . Colonoscopy  09/14/2011    Dr. Gala Romney: via colostomy, Single pedunculated benign inflammatory polyp. Due for surveillance Oct 2015  . Colostomy      Medications: Prior to Admission medications   Medication Sig Start Date End Date Taking? Authorizing Provider  amiodarone (PACERONE) 200 MG tablet Take 1 tablet (200 mg total) by mouth daily. 05/17/14  Yes Evans Lance, MD  apixaban (ELIQUIS) 5 MG TABS tablet Take 5 mg by mouth 2 (two) times daily.   Yes Historical Provider, MD  aspirin 81 MG EC tablet Take 81 mg by mouth at bedtime.    Yes Historical Provider, MD  citalopram (CELEXA) 20 MG tablet Take 1 tablet (20 mg total) by mouth daily. 05/13/14  Yes Lorretta Harp, MD  co-enzyme Q-10 50 MG capsule Take 50 mg by mouth every morning.    Yes Historical Provider, MD  digoxin (LANOXIN) 0.125 MG tablet Take 1 tablet (0.125 mg total) by mouth every other day. 05/17/14  Yes Lorretta Harp, MD  enalapril (VASOTEC) 10 MG tablet Take 10 mg by mouth. 05/23/13  Yes Historical Provider, MD  fluticasone (FLONASE) 50 MCG/ACT nasal spray Place 1 spray into both nostrils daily as  needed for allergies.  11/21/13  Yes Historical Provider, MD  furosemide (LASIX) 20 MG tablet Take 20 mg by mouth daily.  12/13/13  Yes Lezlie Octave Black, NP  insulin aspart (NOVOLOG) 100 UNIT/ML injection Inject 12-14 Units into the skin 3 (three) times daily as needed for high blood sugar. 14 units at breakfast and 12 units a lunch and 12 units at supper per sliding scale    Yes Historical Provider, MD  insulin glargine (LANTUS) 100 UNIT/ML injection Inject 24 Units into the skin at bedtime.    Yes Historical Provider, MD  isosorbide dinitrate (ISORDIL) 30 MG tablet Take 15 mg by mouth 2 (two) times daily. 10/14/11  Yes Historical Provider, MD  meclizine (ANTIVERT) 25 MG tablet Take 1 tablet (25 mg total) by mouth 3 (three) times  daily as needed for dizziness. 4/48/18  Yes Delora Fuel, MD  metFORMIN (GLUCOPHAGE) 500 MG tablet Take 500 mg by mouth 2 (two) times daily with a meal.   Yes Historical Provider, MD  metoprolol succinate (TOPROL-XL) 100 MG 24 hr tablet Take 100 mg by mouth daily. Take with or immediately following a meal.   Yes Historical Provider, MD  niacin (NIASPAN) 1000 MG CR tablet Take 1,000 mg by mouth at bedtime.     Yes Historical Provider, MD  nitroGLYCERIN (NITROLINGUAL) 0.4 MG/SPRAY spray Place 1 spray under the tongue every 5 (five) minutes as needed. angina   Yes Historical Provider, MD  Omega-3 Fatty Acids (FISH OIL) 1200 MG CAPS Take 1,200 mg by mouth 2 (two) times daily.     Yes Historical Provider, MD  pantoprazole (PROTONIX) 40 MG tablet Take 1 tablet (40 mg total) by mouth daily. 11/20/13  Yes Lorretta Harp, MD  ranolazine (RANEXA) 1000 MG SR tablet Take 1 tablet (1,000 mg total) by mouth 2 (two) times daily. 05/13/14  Yes Arnoldo Lenis, MD  rosuvastatin (CRESTOR) 40 MG tablet Take 40 mg by mouth at bedtime. 01/18/14  Yes Arnoldo Lenis, MD  spironolactone (ALDACTONE) 25 MG tablet Take 1 tablet (25 mg total) by mouth daily. 12/17/13  Yes Mihai Croitoru, MD    Allergies:  No Known Allergies  Social History:  reports that he has never smoked. He has never used smokeless tobacco. He reports that he does not drink alcohol or use illicit drugs.  Family History: Family History  Problem Relation Age of Onset  . Colon cancer Mother 40  . Colon cancer Sister 38  . Coronary artery disease Father     Physical Exam: Filed Vitals:   05/19/14 0230 05/19/14 0301 05/19/14 0330 05/19/14 0400  BP: 100/60  95/58 99/63  Pulse: 70  70 70  Temp:      TempSrc:      Resp: 18  19 21   Height:      Weight:      SpO2: 95% 95% 93% 97%   General appearance: alert, cooperative and no distress Head: Normocephalic, without obvious abnormality, atraumatic Eyes: negative Nose: Nares normal. Septum  midline. Mucosa normal. No drainage or sinus tenderness. Neck: no JVD and supple, symmetrical, trachea midline Lungs: clear to auscultation bilaterally Heart: regular rate and rhythm, S1, S2 normal, no murmur, click, rub or gallop Abdomen: soft, non-tender; bowel sounds normal; no masses,  no organomegaly Extremities: extremities normal, atraumatic, no cyanosis or edema Pulses: 2+ and symmetric Skin: Skin color, texture, turgor normal. No rashes or lesions Neurologic: Grossly normal    Labs on Admission:   Recent Labs  05/19/14  0203  NA 132*  K 4.7  CL 94*  CO2 22  GLUCOSE 347*  BUN 30*  CREATININE 1.70*  CALCIUM 9.0    Recent Labs  05/19/14 0203  WBC 11.1*  NEUTROABS 9.7*  HGB 12.6*  HCT 37.2*  MCV 84.4  PLT 150    Recent Labs  05/19/14 0203  TROPONINI <0.30   Radiological Exams on Admission: Dg Chest 2 View  05/19/2014   CLINICAL DATA:  Shortness of breath.  EXAM: CHEST  2 VIEW  COMPARISON:  Chest radiograph performed 02/28/2014  FINDINGS: The lungs are well-aerated. Mild bibasilar opacities may reflect atelectasis or possibly mild pneumonia. There is no evidence of focal opacification, pleural effusion or pneumothorax.  The heart is borderline normal in size. A pacemaker/AICD is noted at the left chest wall, with leads ending at the right atrium, right ventricle and coronary sinus. No acute osseous abnormalities are seen.  IMPRESSION: Mild bibasilar airspace opacities may reflect atelectasis or possibly pneumonia.   Electronically Signed   By: Garald Balding M.D.   On: 05/19/2014 03:13    Assessment/Plan  58 yo male with significant cardiac history and risk factors comes in with sob/anginal equivalent/usa  Principal Problem:   Unstable angina-  ekg does have new twi inf leads new compared to ekg from last 6 months but no other acute changes.  Initial trop neg.  Pt appears euvolemic, will hold off on increasing his lasix any, on 20mg  po daily at home given 80mg   iv in ED.  He is mildly orthostatic.  Last echo in march showed severe reduced lv function with ef 15-20%.     Active Problems:  Stable unless o/w noted.   CARDIOMYOPATHY, ISCHEMIC, with BiV ICD, st Jude EF 23%   SOB (shortness of breath)   CAD with LCX disease and stents to LAD   Chronic systolic heart failure   Acute-on-chronic renal failure  cxr probably atelectasis, has no symptoms of pna, will not give abx at this time unless symptoms develop such as fever, cough, leukocytosis, hypoxia, etc.  Will order incentive spirometry, consider repeat 2 v prior to d/c.  Lungs sound clear.  obs on tele.  Full code.  DAVID,RACHAL A 05/19/2014, 4:55 AM

## 2014-05-19 NOTE — ED Notes (Signed)
Pt states he started getting SOB yesterday & has gotten worse today. Pt states he took 1 extra lasix tab yesterday & regular dosage today.

## 2014-05-19 NOTE — ED Provider Notes (Signed)
CSN: [id number]     Arrival date & time 05/19/14  0019 History  This chart was scribed for Robert Biles, MD by [name], ED Scribe. The patient was seen in room APA01/APA01. Patient's care was started at 12:46 AM.   Chief Complaint  Patient presents with  . Shortness of Breath    The history is provided by the patient. No language interpreter was used.    HPI Comments: [name] is a 58 y.o. male who presents to the Emergency Department complaining of gradually worsening, intermittent, moderate shortness of breath that began 3 days ago. Patient states that he has a history of congestive heart failure and takes Lasix 40 mg daily. He says that he has been instructed by his cardiologist to take double the dose of Lasix when he develops symptoms similar in character to his past episode of CHF, so he took 80 mg of Lasix yesterday. Patient states that his breathing improved until about 4 pm today, when he felt like he was gasping for air. He states that today he has also had dyspnea with exertion and can only walk about 10-15 meters before feeling short of breath. Patient's other recent symptoms include a dry cough. He denies leg swelling, chest pain, abdominal pain, constipation, fever, headaches, or difficulty urinating. Also denies weight gain.  Patient's other medical history includes rectal cancer (treated with chemotherapy and radiation prior to surgery, then post surgical chemotherapy and radiation) - patient is still seeing an oncologist, Dr. Benay Spice. He also has a history of sleep apnea, cardiomyopathy, DM, atrial fibrillation (on amiodarone and coumadin), TIA.   Past Medical History  Diagnosis Date  . Essential hypertension, benign   . Type 2 diabetes mellitus   . Coronary atherosclerosis of native coronary artery     BMS to LAD 2001 at [facility], PTCA/atherectomy ramus and BMS to LAD 2009  . S/P colonoscopy     Normal via ostomy - September 2009  . Chronic systolic heart failure    . Myocardial infarction, anterior wall     Treated with tPA at [facility] 2000  . GERD (gastroesophageal reflux disease)   . Cardiomyopathy, ischemic     BIV ICD St. Jude, LVEF 23%  . Paroxysmal atrial fibrillation     Amiodarone and Coumadin  . 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 Prostate cancer     s/p seed implants with chemo and radiation  . 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 Transaminitis 12/12/2013  . CHF (congestive heart failure)   . TIA (transient ischemic attack)    Past Surgical History  Procedure Laterality Date  . Internal defibrillator and pacemaker  2010    x2 St. Jude device  . Abdominal and perineal resection of rectum with total mesorectal excision      10/04/2007  . Colonoscopy      05/17/2007. IMPRESSION: Semilunar, apple-core neoplasm low in the rectum (palpable on digital rectal exam) beginning at 5 cm and corkscrewing up 5 cm in length. This was a low rectal lesion consistent with colorectal carcinoma. It was biosied multiple times. The upstream colon all the way to the cecum appeared normal. Recommendations: Followup on path. Surgical Consultation   . Colonoscopy  09/14/2011    Dr. Gala Romney: via colostomy, Single pedunculated benign inflammatory polyp. Due for surveillance Oct 2015  . Colostomy     Family History  Problem Relation Age of Onset  . Colon cancer Mother 13  . Colon cancer  Sister 50  . Coronary artery disease Father    History  Substance Use Topics  . Smoking status: Never Smoker   . Smokeless tobacco: Never Used  . Alcohol Use: No     Comment: Former user    Review of Systems  Constitutional: Negative for fever, chills and activity change.  Eyes: Negative for visual disturbance.  Respiratory: Positive for cough and shortness of breath. Negative for chest tightness and wheezing.   Cardiovascular: Negative for chest pain.  Gastrointestinal: Positive for vomiting. Negative for abdominal pain, diarrhea and  abdominal distention.  Genitourinary: Negative for dysuria, enuresis and difficulty urinating.  Musculoskeletal: Negative for arthralgias and neck pain.  Neurological: Positive for dizziness. Negative for light-headedness and headaches.  Psychiatric/Behavioral: Negative for confusion.      Allergies  Review of patient's allergies indicates no known allergies.  Home Medications   Prior to Admission medications   Medication Sig Start Date End Date Taking? Authorizing Provider  amiodarone (PACERONE) 200 MG tablet Take 1 tablet (200 mg total) by mouth daily. 05/17/14  Yes Evans Lance, MD  apixaban (ELIQUIS) 5 MG TABS tablet Take 5 mg by mouth 2 (two) times daily.   Yes Historical Provider, MD  aspirin 81 MG EC tablet Take 81 mg by mouth at bedtime.    Yes Historical Provider, MD  citalopram (CELEXA) 20 MG tablet Take 1 tablet (20 mg total) by mouth daily. 05/13/14  Yes Lorretta Harp, MD  co-enzyme Q-10 50 MG capsule Take 50 mg by mouth every morning.    Yes Historical Provider, MD  digoxin (LANOXIN) 0.125 MG tablet Take 1 tablet (0.125 mg total) by mouth every other day. 05/17/14  Yes Lorretta Harp, MD  enalapril (VASOTEC) 10 MG tablet Take 10 mg by mouth. 05/23/13  Yes Historical Provider, MD  fluticasone (FLONASE) 50 MCG/ACT nasal spray Place 1 spray into both nostrils daily as needed for allergies.  11/21/13  Yes Historical Provider, MD  furosemide (LASIX) 20 MG tablet Take 20 mg by mouth daily.  12/13/13  Yes Lezlie Octave Black, NP  insulin aspart (NOVOLOG) 100 UNIT/ML injection Inject 12-14 Units into the skin 3 (three) times daily as needed for high blood sugar. 14 units at breakfast and 12 units a lunch and 12 units at supper per sliding scale    Yes Historical Provider, MD  insulin glargine (LANTUS) 100 UNIT/ML injection Inject 24 Units into the skin at bedtime.    Yes Historical Provider, MD  isosorbide dinitrate (ISORDIL) 30 MG tablet Take 15 mg by mouth 2 (two) times daily. 10/14/11   Yes Historical Provider, MD  meclizine (ANTIVERT) 25 MG tablet Take 1 tablet (25 mg total) by mouth 3 (three) times daily as needed for dizziness. 08/04/55  Yes Delora Fuel, MD  metFORMIN (GLUCOPHAGE) 500 MG tablet Take 500 mg by mouth 2 (two) times daily with a meal.   Yes Historical Provider, MD  metoprolol succinate (TOPROL-XL) 100 MG 24 hr tablet Take 100 mg by mouth daily. Take with or immediately following a meal.   Yes Historical Provider, MD  niacin (NIASPAN) 1000 MG CR tablet Take 1,000 mg by mouth at bedtime.     Yes Historical Provider, MD  nitroGLYCERIN (NITROLINGUAL) 0.4 MG/SPRAY spray Place 1 spray under the tongue every 5 (five) minutes as needed. angina   Yes Historical Provider, MD  Omega-3 Fatty Acids (FISH OIL) 1200 MG CAPS Take 1,200 mg by mouth 2 (two) times daily.     Yes Historical  Provider, MD  pantoprazole (PROTONIX) 40 MG tablet Take 1 tablet (40 mg total) by mouth daily. 11/20/13  Yes Lorretta Harp, MD  ranolazine (RANEXA) 1000 MG SR tablet Take 1 tablet (1,000 mg total) by mouth 2 (two) times daily. 05/13/14  Yes Arnoldo Lenis, MD  rosuvastatin (CRESTOR) 40 MG tablet Take 40 mg by mouth at bedtime. 01/18/14  Yes Arnoldo Lenis, MD  spironolactone (ALDACTONE) 25 MG tablet Take 1 tablet (25 mg total) by mouth daily. 12/17/13  Yes Mihai Croitoru, MD    Triage Vitals: BP 110/63  Pulse 71  Temp(Src) 98 F (36.7 C) (Oral)  Resp 20  Ht 5\' 10"  (1.778 m)  Wt 219 lb (99.338 kg)  BMI 31.42 kg/m2  SpO2 96% Physical Exam  Nursing note and vitals reviewed. Constitutional: He is oriented to person, place, and time. He appears well-developed and well-nourished. No distress.  HENT:  Head: Normocephalic and atraumatic.  Eyes: Conjunctivae and EOM are normal.  Neck: Normal range of motion. No tracheal deviation present.  Cardiovascular: Normal rate, regular rhythm and normal heart sounds.   No murmur heard. Pulmonary/Chest: Effort normal and breath sounds normal. No  respiratory distress. He has no wheezes. He has no rhonchi. He has no rales.  Abdominal: Soft. There is no tenderness. There is no rebound and no guarding.  Musculoskeletal: Normal range of motion. He exhibits no edema and no tenderness.  No swelling or pitting edema of the bilateral lower extremities.  Neurological: He is alert and oriented to person, place, and time.  Skin: Skin is warm and dry.  Psychiatric: He has a normal mood and affect. His behavior is normal.    ED Course  Procedures (including critical care time) DIAGNOSTIC STUDIES: Oxygen Saturation is 96% on room air, adequate by my interpretation.    COORDINATION OF CARE: 12:55 AM- Patient informed of current plan for treatment and evaluation and agrees with plan at this time.    Labs Review Labs Reviewed  CBC WITH DIFFERENTIAL - Abnormal; Notable for the following:    WBC 11.1 (*)    Hemoglobin 12.6 (*)    HCT 37.2 (*)    RDW 17.3 (*)    Neutrophils Relative % 87 (*)    Neutro Abs 9.7 (*)    Lymphocytes Relative 6 (*)    All other components within normal limits  BASIC METABOLIC PANEL - Abnormal; Notable for the following:    Sodium 132 (*)    Chloride 94 (*)    Glucose, Bld 347 (*)    BUN 30 (*)    Creatinine, Ser 1.70 (*)    GFR calc non Af Amer 42 (*)    GFR calc Af Amer 49 (*)    All other components within normal limits  URINALYSIS, ROUTINE W REFLEX MICROSCOPIC - Abnormal; Notable for the following:    Glucose, UA 500 (*)    All other components within normal limits  PRO B NATRIURETIC PEPTIDE - Abnormal; Notable for the following:    Pro B Natriuretic peptide (BNP) 2631.0 (*)    All other components within normal limits  PROTIME-INR - Abnormal; Notable for the following:    Prothrombin Time 18.7 (*)    INR 1.61 (*)    All other components within normal limits  TROPONIN I  APTT    Imaging Review Dg Chest 2 View  05/19/2014   CLINICAL DATA:  Shortness of breath.  EXAM: CHEST  2 VIEW  COMPARISON:   Chest radiograph  performed 02/28/2014  FINDINGS: The lungs are well-aerated. Mild bibasilar opacities may reflect atelectasis or possibly mild pneumonia. There is no evidence of focal opacification, pleural effusion or pneumothorax.  The heart is borderline normal in size. A pacemaker/AICD is noted at the left chest wall, with leads ending at the right atrium, right ventricle and coronary sinus. No acute osseous abnormalities are seen.  IMPRESSION: Mild bibasilar airspace opacities may reflect atelectasis or possibly pneumonia.   Electronically Signed   By: Garald Balding M.D.   On: 05/19/2014 03:13     EKG Interpretation   Date/Time:  Sunday May 19 2014 02:18:00 EDT Ventricular Rate:  70 PR Interval:    QRS Duration: 129 QT Interval:  539 QTC Calculation: 582 R Axis:   146 Text Interpretation:  Atrial flutter with predominant 3:1 AV block Right  bundle branch block Anterolateral infarct, age indeterminate T wave  inversions in the inferior leads - new Confirmed by Kathrynn Humble, MD, Thelma Comp  [phone]) on 05/19/2014 3:29:23 AM      Date: 05/19/2014  Rate: 70  Rhythm: normal sinus rhythm  QRS Axis: right  Intervals: QT prolonged  ST/T Wave abnormalities: nonspecific ST/T changes  Conduction Disutrbances:right bundle branch block  Narrative Interpretation:   Old EKG Reviewed: changes noted - t wave inversions   MDM   Final diagnoses:  Abnormal EKG  Dyspnea  ACS (acute coronary syndrome)  CHF (congestive heart failure)    I personally performed the services described in this documentation, which was scribed in my presence. The recorded information has been reviewed and is accurate.  Pt with hx of IDDM, advanced ischemic cardiomyopathy comes in with dyspnea. No weight gain. No orthopnea, or PND. Dyspnea on exertion. Lung and lower extremity exams are normal, and pt has no chest pain.  Initial labs are not too concerning. BNP is > 2000. CXR is not showing any infiltrate. Pt has a  cough - but no fever. Low suspicion for PNA.  EKG shows new t wave inversion in the inf leads, and there is also ST Depression. Repeat EKG is unchanged.  On further questioning, pt reports that he gets dib when he has had heart attacks in the past - and so it is possible that patient's current dib is angina equivalent. He has dyspnea on exertion only - so stable angina possible.  Will admit for ACS r/o and possible Cards eval for the ekg changes.      Robert Biles, MD 05/19/14 [phone]

## 2014-05-20 ENCOUNTER — Observation Stay (HOSPITAL_COMMUNITY): Payer: Medicare Other

## 2014-05-20 ENCOUNTER — Encounter (HOSPITAL_COMMUNITY): Payer: Self-pay | Admitting: Gastroenterology

## 2014-05-20 DIAGNOSIS — K8020 Calculus of gallbladder without cholecystitis without obstruction: Secondary | ICD-10-CM | POA: Diagnosis not present

## 2014-05-20 LAB — COMPREHENSIVE METABOLIC PANEL
ALT: 28 U/L (ref 0–53)
AST: 34 U/L (ref 0–37)
Albumin: 2.9 g/dL — ABNORMAL LOW (ref 3.5–5.2)
Alkaline Phosphatase: 51 U/L (ref 39–117)
BILIRUBIN TOTAL: 1.9 mg/dL — AB (ref 0.3–1.2)
BUN: 26 mg/dL — ABNORMAL HIGH (ref 6–23)
CHLORIDE: 92 meq/L — AB (ref 96–112)
CO2: 25 mEq/L (ref 19–32)
Calcium: 9.1 mg/dL (ref 8.4–10.5)
Creatinine, Ser: 1.67 mg/dL — ABNORMAL HIGH (ref 0.50–1.35)
GFR calc Af Amer: 50 mL/min — ABNORMAL LOW (ref >90)
GFR calc non Af Amer: 43 mL/min — ABNORMAL LOW (ref >90)
Glucose, Bld: 181 mg/dL — ABNORMAL HIGH (ref 70–99)
POTASSIUM: 4.5 meq/L (ref 3.7–5.3)
SODIUM: 132 meq/L — AB (ref 137–147)
Total Protein: 7.2 g/dL (ref 6.0–8.3)

## 2014-05-20 LAB — GLUCOSE, CAPILLARY
GLUCOSE-CAPILLARY: 186 mg/dL — AB (ref 70–99)
Glucose-Capillary: 177 mg/dL — ABNORMAL HIGH (ref 70–99)
Glucose-Capillary: 207 mg/dL — ABNORMAL HIGH (ref 70–99)
Glucose-Capillary: 139 mg/dL — ABNORMAL HIGH (ref 70–99)

## 2014-05-20 LAB — CBC WITH DIFFERENTIAL/PLATELET
BASOS PCT: 0 % (ref 0–1)
Basophils Absolute: 0 10*3/uL (ref 0.0–0.1)
EOS ABS: 0.1 10*3/uL (ref 0.0–0.7)
EOS PCT: 1 % (ref 0–5)
HCT: 35.9 % — ABNORMAL LOW (ref 39.0–52.0)
Hemoglobin: 12.1 g/dL — ABNORMAL LOW (ref 13.0–17.0)
Lymphocytes Relative: 11 % — ABNORMAL LOW (ref 12–46)
Lymphs Abs: 1.3 10*3/uL (ref 0.7–4.0)
MCH: 28.4 pg (ref 26.0–34.0)
MCHC: 33.7 g/dL (ref 30.0–36.0)
MCV: 84.3 fL (ref 78.0–100.0)
MONOS PCT: 10 % (ref 3–12)
Monocytes Absolute: 1.2 10*3/uL — ABNORMAL HIGH (ref 0.1–1.0)
NEUTROS PCT: 78 % — AB (ref 43–77)
Neutro Abs: 8.8 10*3/uL — ABNORMAL HIGH (ref 1.7–7.7)
PLATELETS: 158 10*3/uL (ref 150–400)
RBC: 4.26 MIL/uL (ref 4.22–5.81)
RDW: 17.8 % — ABNORMAL HIGH (ref 11.5–15.5)
WBC: 11.3 10*3/uL — ABNORMAL HIGH (ref 4.0–10.5)

## 2014-05-20 MED ORDER — SORBITOL 70 % SOLN
30.0000 mL | Freq: Two times a day (BID) | Status: DC
  Administered 2014-05-20: 30 mL via ORAL
  Filled 2014-05-20 (×5): qty 30

## 2014-05-20 MED ORDER — IOHEXOL 300 MG/ML  SOLN
50.0000 mL | Freq: Once | INTRAMUSCULAR | Status: AC | PRN
  Administered 2014-05-20: 50 mL via ORAL

## 2014-05-20 NOTE — Progress Notes (Signed)
Patient stated he wanted his cardiac medication regardless of what his BP was because his heart MD in [facility] said he was suppose to to take them.   Patient is stable at this time and has no complaints or concerns voiced at this time.

## 2014-05-20 NOTE — Progress Notes (Signed)
Inpatient Diabetes Program Recommendations  AACE/ADA: New Consensus Statement on Inpatient Glycemic Control (2013)  Target Ranges:  Prepandial:   less than 140 mg/dL      Peak postprandial:   less than 180 mg/dL (1-2 hours)      Critically ill patients:  140 - 180 mg/dL     Results for [name], [name] (MRN [id number]) as of 05/20/2014 15:17  Ref. Range 05/19/2014 07:28 05/19/2014 11:25 05/19/2014 16:37 05/19/2014 21:43  Glucose-Capillary Latest Range: 70-99 mg/dL 274 (H) 265 (H) 234 (H) 217 (H)    Results for [name], [name] (MRN [id number]) as of 05/20/2014 15:17  Ref. Range 05/20/2014 07:18 05/20/2014 11:43  Glucose-Capillary Latest Range: 70-99 mg/dL 177 (H) 207 (H)     Admitted with SOB.  History of DM, CAD, CHF.  Home DM Meds:   Lantus 24 units QHS  Novolog 14 units breakfast/ 12 units lunch/ 14 units dinner  Metformin 500 mg bid    MD- Please add Novolog Moderate SSI tid ac + HS to patient's in-hospital insulin regimen    Will follow Wyn Quaker RN, MSN, CDE Diabetes Coordinator Inpatient Diabetes Program Team Pager: [phone] (8a-10p)

## 2014-05-20 NOTE — Progress Notes (Signed)
Note: This document was prepared with digital dictation and possible smart phrase technology. Any transcriptional errors that result from this process are unintentional.   [name] [id number]:[id number] DOB: Mar 28, 1956 DOA: 05/19/2014 PCP: Glo Herring., MD  Brief narrative: 58 y/o ?, known ?OSA, rectal cancer 2008 T3, N2-Rx Chemo [folfox]-radiation and colectomy with resultant ostomy, Ty 2 DM with renal complications, Transaminitis, h/o TIA 02/28/14, Chronic Afib/flutter s/p AICD 2002/upgrade to Biv ICD 2009, CHF class 2, HLD, Obesity, Cad c prior BMS lad 12/1999-, NSTEMI 09/27/08 with stent with Vtach-5 shocks Medical managament 07/2013 tight non-dom RCA 07/2013-started renexa 100 bid at the time  Noted with vague symptoms including a cough, abdominal pain and not passing stool for the past 3 days where he normally has liquid stool. He had one episode of vomiting 6/13 p.m. and decided to come to the emergency room for workup and was admitted for shortness of breath.    Past medical history-As per Problem list Chart reviewed as below- reviewed  Consultants:  None yet  Procedures:  None   Antibiotics:  none   Subjective   no stool yet.  NO cp/SOb n/v right now Passing only a little flatus in ostomy   Objective    Interim History: none  Telemetry:  none   Objective: Filed Vitals:   05/19/14 1206 05/19/14 1506 05/19/14 2042 05/20/14 0440  BP: 78/72 105/64 97/58   Pulse: 77 70 70   Temp:  98.5 F (36.9 C) 100.2 F (37.9 C) 98.9 F (37.2 C)  TempSrc:  Oral Oral Oral  Resp: 20 20 20 20   Height:      Weight:      SpO2: 100% 100% 96% 98%    Intake/Output Summary (Last 24 hours) at 05/20/14 1248 Last data filed at 05/20/14 0800  Gross per 24 hour  Intake    720 ml  Output    450 ml  Net    270 ml    Exam:  General: eomi, ncat Cardiovascular:  s1 s 2no m/r/g Respiratory: clear, no added sound Abdomen: distended.  Not tympanitic.  Ostomy shows no  stool Skin nad Neuro intact  Data Reviewed: Basic Metabolic Panel:  Recent Labs Lab 05/19/14 0203 05/20/14 0440  NA 132* 132*  K 4.7 4.5  CL 94* 92*  CO2 22 25  GLUCOSE 347* 181*  BUN 30* 26*  CREATININE 1.70* 1.67*  CALCIUM 9.0 9.1   Liver Function Tests:  Recent Labs Lab 05/20/14 0440  AST 34  ALT 28  ALKPHOS 51  BILITOT 1.9*  PROT 7.2  ALBUMIN 2.9*   No results found for this basename: LIPASE, AMYLASE,  in the last 168 hours No results found for this basename: AMMONIA,  in the last 168 hours CBC:  Recent Labs Lab 05/19/14 0203 05/20/14 0440  WBC 11.1* 11.3*  NEUTROABS 9.7* 8.8*  HGB 12.6* 12.1*  HCT 37.2* 35.9*  MCV 84.4 84.3  PLT 150 158   Cardiac Enzymes:  Recent Labs Lab 05/19/14 0203 05/19/14 0710 05/19/14 1258 05/19/14 1915  TROPONINI <0.30 <0.30 <0.30 <0.30   BNP: No components found with this basename: POCBNP,  CBG:  Recent Labs Lab 05/19/14 1125 05/19/14 1637 05/19/14 2143 05/20/14 0718 05/20/14 1143  GLUCAP 265* 234* 217* 177* 207*    No results found for this or any previous visit (from the past 240 hour(s)).   Studies:              All Imaging reviewed and is as  per above notation   Scheduled Meds: . amiodarone  200 mg Oral Daily  . apixaban  5 mg Oral BID  . aspirin EC  81 mg Oral QHS  . citalopram  20 mg Oral Daily  . digoxin  0.125 mg Oral QODAY  . enalapril  10 mg Oral Daily  . furosemide  20 mg Oral Daily  . insulin aspart  12 Units Subcutaneous Q lunch  . insulin aspart  14 Units Subcutaneous BID WC  . insulin glargine  24 Units Subcutaneous QHS  . isosorbide dinitrate  15 mg Oral BID  . metoprolol succinate  100 mg Oral Daily  . niacin  1,000 mg Oral QHS  . pantoprazole  40 mg Oral Daily  . ranolazine  1,000 mg Oral BID  . sorbitol  30 mL Oral BID  . spironolactone  25 mg Oral Daily   Continuous Infusions:    Assessment/Plan: 1. ? SBO vs adynamic obstruction-CXr didn;t show any acute findings.   Obtsain Ct with contrast and reassess.  Will formally consutl Gen surg if prn 2. rectal cancer 2008 T3, N2-Rx Chemo [folfox]-radiation and colectomy with resultant ostomy--see above 3. Ty 2 DM with renal complication-monitor-blood sugars are 181-207.  Cont lantus 24 U and bid aspar Insulint 14  As well as lunchtime 12 U 4. Compensated CHF class '2'-stable currently-continue aldactone 25 qd, emalipril 10 qd, isordil 15 bid, metoprolol 100 XL qd,  5. Chronic afib-continue Amiodarone 200 daily, digoxin 0.125 qod abovemeds 6. CAd-continue Ranolazine 1000 q 12, asa 81 daily 7. Depression-continue Celexa 20 daily   Code Status: Full Family Communication:  None bedside Disposition Plan: Inpatient peding resolution   [name], MD  [facility] Pager [phone] 05/20/2014, 12:48 PM    LOS: 1 day

## 2014-05-20 NOTE — Consult Note (Signed)
Referring Provider: Daneil Dolin, MD Primary Care Physician:  Glo Herring., MD Primary Gastroenterologist:  Garfield Cornea, MD Reason for Consultation:  No ostomy output Oncologist: Dr. Larey Seat  Surgeon: Dr. Fanny Skates  Cardiologist: [facility]  CHF Clinic: Dr. Carolynn Serve at [facility]  HPI: [name] is a 58 y.o. male presented Saturday evening with complaints of cough, shortness breath, abdominal pain, not passing stool for 3 days. He has history of rectal adenocarcinoma, diagnosed in 2008 and treated with chemoradiation, underwent APR, post-op chemo. Last surveillance through ostomy was Oct 2012 with benign inflammatory polyp. He is due for high risk surveillance October 2015. He also has a family history of colon cancer in both his mother and sister. Personal history of prostate cancer status post treatment. History of TIA, diabetes mellitus, chronic A. fib/flutter status post AICD placement.  The patient states it has been 6 days since he's had any output through his ostomy. Typically would have some output on a daily basis. One episode of vomiting the day of presentation but really has had no nausea since that time. Denies any blood in the stool. Denies any significant abdominal pain. Feels bloated. Overall has been well from a GI standpoint. He is due for his surveillance colonoscopy later this fall.  Prior to Admission medications   Medication Sig Start Date End Date Taking? Authorizing Provider  amiodarone (PACERONE) 200 MG tablet Take 1 tablet (200 mg total) by mouth daily. 05/17/14  Yes Evans Lance, MD  apixaban (ELIQUIS) 5 MG TABS tablet Take 5 mg by mouth 2 (two) times daily.   Yes Historical Provider, MD  aspirin 81 MG EC tablet Take 81 mg by mouth at bedtime.    Yes Historical Provider, MD  citalopram (CELEXA) 20 MG tablet Take 1 tablet (20 mg total) by mouth daily. 05/13/14  Yes Lorretta Harp, MD  co-enzyme Q-10 50 MG capsule Take 50 mg by  mouth every morning.    Yes Historical Provider, MD  digoxin (LANOXIN) 0.125 MG tablet Take 1 tablet (0.125 mg total) by mouth every other day. 05/17/14  Yes Lorretta Harp, MD  enalapril (VASOTEC) 10 MG tablet Take 10 mg by mouth daily.  05/23/13  Yes Historical Provider, MD  fluticasone (FLONASE) 50 MCG/ACT nasal spray Place 1 spray into both nostrils daily as needed for allergies.  11/21/13  Yes Historical Provider, MD  furosemide (LASIX) 20 MG tablet Take 20 mg by mouth daily.  12/13/13  Yes Lezlie Octave Black, NP  insulin aspart (NOVOLOG) 100 UNIT/ML injection Inject 12-14 Units into the skin 3 (three) times daily as needed for high blood sugar. 14 units at breakfast and 12 units a lunch and 12 units at supper per sliding scale    Yes Historical Provider, MD  insulin glargine (LANTUS) 100 UNIT/ML injection Inject 24 Units into the skin at bedtime.    Yes Historical Provider, MD  isosorbide dinitrate (ISORDIL) 30 MG tablet Take 15 mg by mouth 2 (two) times daily. 10/14/11  Yes Historical Provider, MD  meclizine (ANTIVERT) 25 MG tablet Take 1 tablet (25 mg total) by mouth 3 (three) times daily as needed for dizziness. 0/53/97  Yes Delora Fuel, MD  metFORMIN (GLUCOPHAGE) 500 MG tablet Take 500 mg by mouth 2 (two) times daily with a meal.   Yes Historical Provider, MD  metoprolol succinate (TOPROL-XL) 100 MG 24 hr tablet Take 100 mg by mouth daily. Take with or immediately following a meal.   Yes Historical  Provider, MD  niacin (NIASPAN) 1000 MG CR tablet Take 1,000 mg by mouth at bedtime.     Yes Historical Provider, MD  Omega-3 Fatty Acids (FISH OIL) 1200 MG CAPS Take 1,200 mg by mouth 2 (two) times daily.     Yes Historical Provider, MD  pantoprazole (PROTONIX) 40 MG tablet Take 1 tablet (40 mg total) by mouth daily. 11/20/13  Yes Lorretta Harp, MD  ranolazine (RANEXA) 1000 MG SR tablet Take 1 tablet (1,000 mg total) by mouth 2 (two) times daily. 05/13/14  Yes Arnoldo Lenis, MD  rosuvastatin  (CRESTOR) 40 MG tablet Take 40 mg by mouth at bedtime. 01/18/14  Yes Arnoldo Lenis, MD  spironolactone (ALDACTONE) 25 MG tablet Take 1 tablet (25 mg total) by mouth daily. 12/17/13  Yes Mihai Croitoru, MD  nitroGLYCERIN (NITROLINGUAL) 0.4 MG/SPRAY spray Place 1 spray under the tongue every 5 (five) minutes as needed. angina    Historical Provider, MD    Current Facility-Administered Medications  Medication Dose Route Frequency Provider Last Rate Last Dose  . amiodarone (PACERONE) tablet 200 mg  200 mg Oral Daily Phillips Grout, MD   200 mg at 05/20/14 1053  . apixaban (ELIQUIS) tablet 5 mg  5 mg Oral BID Phillips Grout, MD   5 mg at 05/20/14 1053  . aspirin EC tablet 81 mg  81 mg Oral QHS Phillips Grout, MD   81 mg at 05/19/14 2201  . citalopram (CELEXA) tablet 20 mg  20 mg Oral Daily Phillips Grout, MD   20 mg at 05/20/14 1053  . digoxin (LANOXIN) tablet 0.125 mg  0.125 mg Oral QODAY Phillips Grout, MD   0.125 mg at 05/19/14 1210  . enalapril (VASOTEC) tablet 10 mg  10 mg Oral Daily Phillips Grout, MD   10 mg at 05/20/14 1051  . fluticasone (FLONASE) 50 MCG/ACT nasal spray 1 spray  1 spray Each Nare Daily PRN Phillips Grout, MD      . furosemide (LASIX) tablet 20 mg  20 mg Oral Daily Phillips Grout, MD   20 mg at 05/20/14 1053  . insulin aspart (novoLOG) injection 12 Units  12 Units Subcutaneous Q lunch Phillips Grout, MD   12 Units at 05/19/14 1204  . insulin aspart (novoLOG) injection 14 Units  14 Units Subcutaneous BID WC Phillips Grout, MD   14 Units at 05/20/14 0800  . insulin glargine (LANTUS) injection 24 Units  24 Units Subcutaneous QHS Phillips Grout, MD   24 Units at 05/19/14 2203  . isosorbide dinitrate (ISORDIL) tablet 15 mg  15 mg Oral BID Phillips Grout, MD   15 mg at 05/20/14 1052  . meclizine (ANTIVERT) tablet 25 mg  25 mg Oral TID PRN Phillips Grout, MD      . metoprolol succinate (TOPROL-XL) 24 hr tablet 100 mg  100 mg Oral Daily Phillips Grout, MD   100 mg at 05/20/14 1052   . niacin (NIASPAN) CR tablet 1,000 mg  1,000 mg Oral QHS Phillips Grout, MD   1,000 mg at 05/19/14 2201  . ondansetron (ZOFRAN) injection 4 mg  4 mg Intravenous Q6H PRN Nita Sells, MD   4 mg at 05/19/14 1600  . pantoprazole (PROTONIX) EC tablet 40 mg  40 mg Oral Daily Phillips Grout, MD   40 mg at 05/20/14 1052  . ranolazine (RANEXA) 12 hr tablet 1,000 mg  1,000 mg Oral BID Rachal A  Shanon Brow, MD   1,000 mg at 05/20/14 1052  . sorbitol 70 % solution 30 mL  30 mL Oral BID Nita Sells, MD   30 mL at 05/20/14 1051  . spironolactone (ALDACTONE) tablet 25 mg  25 mg Oral Daily Phillips Grout, MD   25 mg at 05/20/14 1053    Allergies as of 05/19/2014  . (No Known Allergies)    Past Medical History  Diagnosis Date  . Essential hypertension, benign   . Type 2 diabetes mellitus   . Coronary atherosclerosis of native coronary artery     BMS to LAD 2001 at [facility], PTCA/atherectomy ramus and BMS to LAD 2009  . S/P colonoscopy     Normal via ostomy - September 2009  . Chronic systolic heart failure   . Myocardial infarction, anterior wall     Treated with tPA at [facility] 2000  . GERD (gastroesophageal reflux disease)   . Cardiomyopathy, ischemic     BIV ICD St. Jude, LVEF 23%  . Paroxysmal atrial fibrillation     Amiodarone and Coumadin  . 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 Prostate cancer     s/p seed implants with chemo and radiation  . 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 Transaminitis 12/12/2013  . CHF (congestive heart failure)   . TIA (transient ischemic attack)     Past Surgical History  Procedure Laterality Date  . Internal defibrillator and pacemaker  2010    x2 St. Jude device  . Abdominal and perineal resection of rectum with total mesorectal excision      10/04/2007  . Colonoscopy      05/17/2007. IMPRESSION: Semilunar, apple-core neoplasm low in the rectum (palpable on digital rectal exam) beginning at 5 cm and corkscrewing up 5  cm in length. This was a low rectal lesion consistent with colorectal carcinoma. It was biosied multiple times. The upstream colon all the way to the cecum appeared normal. Recommendations: Followup on path. Surgical Consultation   . Colonoscopy  09/14/2011    Dr. Gala Romney: via colostomy, Single pedunculated benign inflammatory polyp. Due for surveillance Oct 2015  . Colostomy      Family History  Problem Relation Age of Onset  . Colon cancer Mother 81  . Colon cancer Sister 48  . Coronary artery disease Father   . Colon cancer Other     2 cousins, succumbed to illness    History   Social History  . Marital Status: Married    Spouse Name: N/A    Number of Children: 1  . Years of Education: N/A   Occupational History  .     Social History Main Topics  . Smoking status: Never Smoker   . Smokeless tobacco: Never Used  . Alcohol Use: No     Comment: Former user  . Drug Use: No  . Sexual Activity: No   Other Topics Concern  . Not on file   Social History Narrative  . No narrative on file     ROS:  General: Negative for anorexia, weight loss, fever. See history of present illness. Eyes: Negative for vision changes.  ENT: Negative for hoarseness, difficulty swallowing , nasal congestion. CV: Negative for chest pain, angina, palpitations, dyspnea on exertion, peripheral edema. No further shortness of breath or chest pain.  Respiratory: Negative for dyspnea at rest, dyspnea on exertion, cough, sputum, wheezing. No further shortness of breath or chest pain. GI: See history of present illness.  GU:  Negative for dysuria, hematuria, urinary incontinence, urinary frequency, nocturnal urination.  MS: Negative for joint pain, low back pain.  Derm: Negative for rash or itching.  Neuro: Negative for weakness, abnormal sensation, seizure, frequent headaches, memory loss, confusion.  Psych: Negative for anxiety, depression, suicidal ideation, hallucinations.  Endo: Negative for unusual  weight change.  Heme: Negative for bruising or bleeding. Allergy: Negative for rash or hives.       Physical Examination: Vital signs in last 24 hours: Temp:  [98.5 F (36.9 C)-100.2 F (37.9 C)] 98.6 F (37 C) (06/15 1340) Pulse Rate:  [68-70] 68 (06/15 1340) Resp:  [20] 20 (06/15 1340) BP: (97-105)/(58-64) 97/58 mmHg (06/14 2042) SpO2:  [96 %-100 %] 96 % (06/15 1340) Last BM Date: 05/17/14  General: Well-nourished, well-developed in no acute distress.  Head: Normocephalic, atraumatic.   Eyes: Conjunctiva pink, no icterus. Mouth: Oropharyngeal mucosa moist and pink , no lesions erythema or exudate. Neck: Supple without thyromegaly, masses, or lymphadenopathy.  Lungs: Clear to auscultation bilaterally.  Heart: Regular rate and rhythm, no murmurs rubs or gallops.  Abdomen: Bowel sounds are normal, nontender, nondistended, no hepatosplenomegaly or masses, no abdominal bruits or    hernia , no rebound or guarding.  No output in ostomy. Rectal: Nonapplicable Extremities: No lower extremity edema, clubbing, deformity.  Neuro: Alert and oriented x 4 , grossly normal neurologically.  Skin: Warm and dry, no rash or jaundice.   Psych: Alert and cooperative, normal mood and affect.        Intake/Output from previous day: 06/14 0701 - 06/15 0700 In: 480 [P.O.:480] Out: 450 [Urine:450] Intake/Output this shift: Total I/O In: 480 [P.O.:480] Out: -   Lab Results: CBC  Recent Labs  05/19/14 0203 05/20/14 0440  WBC 11.1* 11.3*  HGB 12.6* 12.1*  HCT 37.2* 35.9*  MCV 84.4 84.3  PLT 150 158   BMET  Recent Labs  05/19/14 0203 05/20/14 0440  NA 132* 132*  K 4.7 4.5  CL 94* 92*  CO2 22 25  GLUCOSE 347* 181*  BUN 30* 26*  CREATININE 1.70* 1.67*  CALCIUM 9.0 9.1   LFT  Recent Labs  05/20/14 0440  BILITOT 1.9*  ALKPHOS 51  AST 34  ALT 28  PROT 7.2  ALBUMIN 2.9*    Lipase No results found for this basename: LIPASE,  in the last 72 hours  PT/INR  Recent  Labs  05/19/14 0204  LABPROT 18.7*  INR 1.61*      Imaging Studies: Dg Chest 2 View  05/19/2014   CLINICAL DATA:  Shortness of breath.  EXAM: CHEST  2 VIEW  COMPARISON:  Chest radiograph performed 02/28/2014  FINDINGS: The lungs are well-aerated. Mild bibasilar opacities may reflect atelectasis or possibly mild pneumonia. There is no evidence of focal opacification, pleural effusion or pneumothorax.  The heart is borderline normal in size. A pacemaker/AICD is noted at the left chest wall, with leads ending at the right atrium, right ventricle and coronary sinus. No acute osseous abnormalities are seen.  IMPRESSION: Mild bibasilar airspace opacities may reflect atelectasis or possibly pneumonia.   Electronically Signed   By: Garald Balding M.D.   On: 05/19/2014 03:13   Dg Abd Acute W/chest  05/19/2014   CLINICAL DATA:  Abdominal discomfort.  Constipated.  EXAM: ACUTE ABDOMEN SERIES (ABDOMEN 2 VIEW & CHEST 1 VIEW)  COMPARISON:  Chest x-ray 05/19/2014  FINDINGS: Left AICD remains in place, unchanged. Cardiomegaly. No confluent opacities, effusions or edema.  Large stool  burden throughout the colon. Mildly prominent left abdominal small bowel loops with scattered air-fluid levels. No free air organomegaly. No suspicious calcification. Degenerative changes in the lumbar spine and hips.  IMPRESSION: Large stool burden.  Mildly prominent left abdominal small bowel loops. While this may reflect focal ileus, I cannot exclude early small bowel obstruction.  Cardiomegaly.   Electronically Signed   By: Rolm Baptise M.D.   On: 05/19/2014 11:54  [4 week]   Impression: 58 year old gentleman with history of stage III rectal carcinoma diagnosed in 2008, status post APR/chemotherapy radiation therapy who presented to hospital two days ago with SOB, Abd pain and c/o no ostomy output for several days. It has now been 6 days without ostomy output. Abdominal film showed large stool burden with mildly prominent left  abdominal small bowel loops possibly reflecting ileus versus early small bowel obstruction. Patient basically without any significant abdominal discomfort or vomiting. CT scan planned for today, patient in process of consuming oral contrast at this time.  Plan: 1. F/U CT as available. Further recommendations to follow.  We would like to thank you for the opportunity to participate in the care of [name].    LOS: 1 day   Neil Crouch  05/20/2014, 2:05 PM   Attending note:  Patient seen this evening at 19:25.  Plain films and CT reviewed with Dr. Brent General.  Significant small bowel dilation on plain film suggestive of an enteritis versus early obstruction. However, CT looks good; small bowel decompressed; colon looks good; no parastomal hernia, etc. It is notable patient had multiple large BMs with the contrast and now his abdomen is decompressed -  he feels much better.  Moreover, patient states for the last week he has consumed an entire "family size" bag of cheese popcorn nightly by himself. He notes that this is about the length of time he has had bowel dysfunction. I reviewed dietary modification with him. He will be reassessed tomorrow morning. Hopefully he can be discharged same. He will need a surveillance colonoscopy this coming Fall.

## 2014-05-20 NOTE — Care Management Note (Addendum)
    Page 1 of 1   05/23/2014     2:45:38 PM CARE MANAGEMENT NOTE 05/23/2014  Patient:  [name], [name]   Account Number:  [id number]  Date Initiated:  05/20/2014  Documentation initiated by:  Claretha Cooper  Subjective/Objective Assessment:   Pt admitted from home. Lives with his wife and assists taking care of his two grandchildren.Mother is at bedside. No HH or DME needs identified. Independent with ADL.     Action/Plan:   Anticipated DC Date:  05/20/2014   Anticipated DC Plan:  [place]  CM consult      Choice offered to / List presented to:             Status of service:  Completed, signed off Medicare Important Message given?  YES (If response is "NO", the following Medicare IM given date fields will be blank) Date Medicare IM given:  05/23/2014 Date Additional Medicare IM given:    Discharge Disposition:  HOME/SELF CARE  Per UR Regulation:    If discussed at Long Length of Stay Meetings, dates discussed:    Comments:  05/20/14 Claretha Cooper RN BSN CM

## 2014-05-20 NOTE — Progress Notes (Signed)
UR Completed.  [name] [phone]  

## 2014-05-20 NOTE — Progress Notes (Signed)
Patient stated that he has had 3 large BM's.  He stated that he does not want the sorbitol tonight.  I will shoot a to the MD.

## 2014-05-21 ENCOUNTER — Encounter: Payer: Medicare Other | Admitting: Cardiology

## 2014-05-21 DIAGNOSIS — I472 Ventricular tachycardia, unspecified: Secondary | ICD-10-CM | POA: Diagnosis present

## 2014-05-21 DIAGNOSIS — K5900 Constipation, unspecified: Secondary | ICD-10-CM

## 2014-05-21 DIAGNOSIS — Z85048 Personal history of other malignant neoplasm of rectum, rectosigmoid junction, and anus: Secondary | ICD-10-CM

## 2014-05-21 LAB — COMPREHENSIVE METABOLIC PANEL
ALBUMIN: 3 g/dL — AB (ref 3.5–5.2)
ALT: 26 U/L (ref 0–53)
AST: 32 U/L (ref 0–37)
Alkaline Phosphatase: 52 U/L (ref 39–117)
BUN: 26 mg/dL — ABNORMAL HIGH (ref 6–23)
CALCIUM: 9.1 mg/dL (ref 8.4–10.5)
CO2: 20 mEq/L (ref 19–32)
CREATININE: 1.66 mg/dL — AB (ref 0.50–1.35)
Chloride: 92 mEq/L — ABNORMAL LOW (ref 96–112)
GFR calc Af Amer: 51 mL/min — ABNORMAL LOW (ref >90)
GFR, EST NON AFRICAN AMERICAN: 44 mL/min — AB (ref >90)
Glucose, Bld: 172 mg/dL — ABNORMAL HIGH (ref 70–99)
Potassium: 4.4 mEq/L (ref 3.7–5.3)
SODIUM: 129 meq/L — AB (ref 137–147)
Total Bilirubin: 1.7 mg/dL — ABNORMAL HIGH (ref 0.3–1.2)
Total Protein: 7.1 g/dL (ref 6.0–8.3)

## 2014-05-21 LAB — CBC WITH DIFFERENTIAL/PLATELET
Basophils Absolute: 0 10*3/uL (ref 0.0–0.1)
Basophils Relative: 0 % (ref 0–1)
EOS ABS: 0.1 10*3/uL (ref 0.0–0.7)
Eosinophils Relative: 1 % (ref 0–5)
HCT: 36.8 % — ABNORMAL LOW (ref 39.0–52.0)
HEMOGLOBIN: 12.5 g/dL — AB (ref 13.0–17.0)
LYMPHS ABS: 1.2 10*3/uL (ref 0.7–4.0)
Lymphocytes Relative: 9 % — ABNORMAL LOW (ref 12–46)
MCH: 28.3 pg (ref 26.0–34.0)
MCHC: 34 g/dL (ref 30.0–36.0)
MCV: 83.4 fL (ref 78.0–100.0)
MONO ABS: 1 10*3/uL (ref 0.1–1.0)
Monocytes Relative: 8 % (ref 3–12)
NEUTROS PCT: 82 % — AB (ref 43–77)
Neutro Abs: 10.5 10*3/uL — ABNORMAL HIGH (ref 1.7–7.7)
Platelets: 173 10*3/uL (ref 150–400)
RBC: 4.41 MIL/uL (ref 4.22–5.81)
RDW: 17.3 % — ABNORMAL HIGH (ref 11.5–15.5)
WBC: 12.8 10*3/uL — ABNORMAL HIGH (ref 4.0–10.5)

## 2014-05-21 LAB — CK TOTAL AND CKMB (NOT AT ARMC)
CK, MB: <1 ng/mL (ref 0.3–4.0)
Total CK: 39 U/L (ref 7–232)

## 2014-05-21 LAB — GLUCOSE, CAPILLARY
GLUCOSE-CAPILLARY: 168 mg/dL — AB (ref 70–99)
Glucose-Capillary: 146 mg/dL — ABNORMAL HIGH (ref 70–99)
Glucose-Capillary: 121 mg/dL — ABNORMAL HIGH (ref 70–99)
Glucose-Capillary: 118 mg/dL — ABNORMAL HIGH (ref 70–99)
Glucose-Capillary: 129 mg/dL — ABNORMAL HIGH (ref 70–99)

## 2014-05-21 LAB — MAGNESIUM: Magnesium: 1.8 mg/dL (ref 1.5–2.5)

## 2014-05-21 LAB — TSH: TSH: 1.64 u[IU]/mL (ref 0.350–4.500)

## 2014-05-21 LAB — MRSA PCR SCREENING: MRSA BY PCR: NEGATIVE

## 2014-05-21 MED ORDER — SPIRONOLACTONE 25 MG PO TABS: 12.5000 mg | ORAL_TABLET | Freq: Every day | ORAL | Status: DC

## 2014-05-21 MED ORDER — AMIODARONE HCL IN DEXTROSE 360-4.14 MG/200ML-% IV SOLN
60.0000 mg/h | INTRAVENOUS | Status: AC
  Administered 2014-05-21: 60 mg/h via INTRAVENOUS
  Filled 2014-05-21 (×2): qty 200

## 2014-05-21 MED ORDER — AMIODARONE IV BOLUS ONLY 150 MG/100ML
150.0000 mg | Freq: Once | INTRAVENOUS | Status: AC
  Administered 2014-05-21: 150 mg via INTRAVENOUS
  Filled 2014-05-21 (×2): qty 100

## 2014-05-21 MED ORDER — SODIUM CHLORIDE 0.9 % IV BOLUS (SEPSIS)
500.0000 mL | Freq: Once | INTRAVENOUS | Status: AC
  Administered 2014-05-21: 500 mL via INTRAVENOUS

## 2014-05-21 MED ORDER — RANOLAZINE ER 500 MG PO TB12
1000.0000 mg | ORAL_TABLET | Freq: Every day | ORAL | Status: DC
  Administered 2014-05-22 – 2014-05-23 (×2): 1000 mg via ORAL
  Filled 2014-05-21 (×3): qty 2

## 2014-05-21 MED ORDER — METOPROLOL SUCCINATE ER 50 MG PO TB24
50.0000 mg | ORAL_TABLET | Freq: Every day | ORAL | Status: DC
  Administered 2014-05-23: 50 mg via ORAL
  Filled 2014-05-21: qty 1

## 2014-05-21 MED ORDER — CEPHALEXIN 500 MG PO CAPS
500.0000 mg | ORAL_CAPSULE | Freq: Two times a day (BID) | ORAL | Status: DC
  Administered 2014-05-21: 500 mg via ORAL
  Filled 2014-05-21 (×6): qty 1

## 2014-05-21 MED ORDER — MAGNESIUM SULFATE 40 MG/ML IJ SOLN
2.0000 g | Freq: Once | INTRAMUSCULAR | Status: AC
  Administered 2014-05-21: 2 g via INTRAVENOUS
  Filled 2014-05-21: qty 50

## 2014-05-21 MED ORDER — AMIODARONE HCL IN DEXTROSE 360-4.14 MG/200ML-% IV SOLN
30.0000 mg/h | INTRAVENOUS | Status: DC
  Administered 2014-05-21 – 2014-05-22 (×2): 30 mg/h via INTRAVENOUS
  Filled 2014-05-21: qty 200

## 2014-05-21 NOTE — Progress Notes (Signed)
Mild cellulitis noted on RUE  Started PO keflex 500 bid  [name], MD [facility] [phone]

## 2014-05-21 NOTE — Progress Notes (Signed)
Note: This document was prepared with digital dictation and possible smart phrase technology. Any transcriptional errors that result from this process are unintentional.   [name]:[id number] DOB: 07/08/1956 DOA: 05/19/2014 PCP: Glo Herring., MD  Brief narrative: 58 y/o ?, known ?OSA, rectal cancer 2008 T3, N2-Rx Chemo [folfox]-radiation and colectomy with resultant ostomy, Ty 2 DM with renal complications, Transaminitis, h/o TIA 02/28/14, Chronic Afib/flutter s/p AICD 2002/upgrade to Biv ICD 2009, CHF class 2, HLD, Obesity, Cad c prior BMS lad 12/1999-, NSTEMI 09/27/08 with stent with Vtach-5 shocks Medical managament 07/2013 tight non-dom RCA 07/2013-started renexa 100 bid at the time  Noted with vague symptoms including a cough, abdominal pain and not passing stool for the past 3 days where he normally has liquid stool. He had one episode of vomiting 6/13 p.m. and decided to come to the emergency room for workup and was admitted for shortness of breath.  Eventually on further discussion he was noted to have no stool for 3-4 days and eating a diet rich in pop-corn.  Bowel prep done for Ct abdomen pelvis caused him to have 3 large liquid stools.  HE flipped into sustained Vtach without cp or unresponsiveness and was transferred to [facility] SDU for monitoring and started on IV amiodarone and cardiology consulted   Past medical history-As per Problem list Chart reviewed as below- reviewed  Consultants:  None yet  Procedures:  None   Antibiotics:  none   Subjective   Sleeping Nursing alerted me that patient in [place]  Patient awakeend and not aware, Has no CP or subj palpitations   Objective    Interim History: none  Telemetry:  none   Objective: Filed Vitals:   05/20/14 1340 05/20/14 2252 05/21/14 0244 05/21/14 0619  BP:  105/63 101/66 98/66  Pulse: 68 69 70 76  Temp: 98.6 F (37 C) 99.3 F (37.4 C) 97.8 F (36.6 C) 99.2 F (37.3 C)  TempSrc: Oral Oral Oral  Oral  Resp: 20 20 20 20   Height:      Weight:      SpO2: 96% 95% 93%     Intake/Output Summary (Last 24 hours) at 05/21/14 1001 Last data filed at 05/20/14 1753  Gross per 24 hour  Intake    480 ml  Output      0 ml  Net    480 ml    Exam:  General: eomi, ncat Cardiovascular:  s1 s 2no m/r/g, tachycardic Respiratory: clear, no added sound Abdomen: less distended Skin nad Neuro intact  Data Reviewed: Basic Metabolic Panel:  Recent Labs Lab 05/19/14 0203 05/20/14 0440 05/21/14 0456 05/21/14 0500  NA 132* 132*  --  129*  K 4.7 4.5  --  4.4  CL 94* 92*  --  92*  CO2 22 25  --  20  GLUCOSE 347* 181*  --  172*  BUN 30* 26*  --  26*  CREATININE 1.70* 1.67*  --  1.66*  CALCIUM 9.0 9.1  --  9.1  MG  --   --  1.8  --    Liver Function Tests:  Recent Labs Lab 05/20/14 0440 05/21/14 0500  AST 34 32  ALT 28 26  ALKPHOS 51 52  BILITOT 1.9* 1.7*  PROT 7.2 7.1  ALBUMIN 2.9* 3.0*   No results found for this basename: LIPASE, AMYLASE,  in the last 168 hours No results found for this basename: AMMONIA,  in the last 168 hours CBC:  Recent Labs Lab  05/19/14 0203 05/20/14 0440 05/21/14 0500  WBC 11.1* 11.3* 12.8*  NEUTROABS 9.7* 8.8* 10.5*  HGB 12.6* 12.1* 12.5*  HCT 37.2* 35.9* 36.8*  MCV 84.4 84.3 83.4  PLT 150 158 173   Cardiac Enzymes:  Recent Labs Lab 05/19/14 0203 05/19/14 0710 05/19/14 1258 05/19/14 1915 05/21/14 0456  CKTOTAL  --   --   --   --  39  CKMB  --   --   --   --  PENDING  TROPONINI <0.30 <0.30 <0.30 <0.30  --    BNP: No components found with this basename: POCBNP,  CBG:  Recent Labs Lab 05/20/14 1143 05/20/14 1618 05/20/14 2248 05/21/14 0214 05/21/14 0723  GLUCAP 207* 186* 139* 168* 146*    No results found for this or any previous visit (from the past 240 hour(s)).   Studies:              All Imaging reviewed and is as per above notation   Scheduled Meds: . apixaban  5 mg Oral BID  . aspirin EC  81 mg Oral QHS  .  citalopram  20 mg Oral Daily  . digoxin  0.125 mg Oral QODAY  . enalapril  10 mg Oral Daily  . furosemide  20 mg Oral Daily  . insulin aspart  12 Units Subcutaneous Q lunch  . insulin aspart  14 Units Subcutaneous BID WC  . insulin glargine  24 Units Subcutaneous QHS  . isosorbide dinitrate  15 mg Oral BID  . magnesium sulfate 1 - 4 g bolus IVPB  2 g Intravenous Once  . metoprolol succinate  100 mg Oral Daily  . niacin  1,000 mg Oral QHS  . pantoprazole  40 mg Oral Daily  . ranolazine  1,000 mg Oral BID  . sorbitol  30 mL Oral BID  . spironolactone  25 mg Oral Daily   Continuous Infusions: . amiodarone 60 mg/hr (05/21/14 0948)   Followed by  . amiodarone       Assessment/Plan:  1. Sustained asymptomatic Ventricular tachycardia-loaded with IV amiodarone bolus gtt.  Replacing Magnesium to keep at least above 2.  EKG confirms Vtach.  Cardiology consult appreciated 2. ? SBO vs adynamic obstruction-CXr didn;t show any acute findings.  Passed good amount of stool with contrast for Ct scan.  Needs dietary modification as eats a lot of popcorn 3. rectal cancer 2008 T3, N2-Rx Chemo [folfox]-radiation and colectomy with resultant ostomy--see above 4. Ty 2 DM with renal complication-monitor-blood sugars are 139-146.  Cont lantus 24 U and bid aspar Insulint 14  As well as lunchtime 12 U 5. Compensated CHF class '2'-stable currently-continue aldactone 25 qd, emalipril 10 qd, isordil 15 bid, metoprolol 100 XL qd,  6. Chronic afib-continue Amiodarone IV gtt and digoxin 0.125 qod above meds-Currently on Amio Gtt 7. CAd-continue Ranolazine 1000 q 12, asa 81 daily 8. Depression-continue Celexa 20 daily 9. Hyponatremia-could be 2/2 to diarrheal losses?  Monitor-Lasix held 6/16 10. Stage 2 CKD-monitor-cut back dose of Aldactone to 12.5 daily   Code Status: Full Family Communication:  None bedside Disposition Plan: transferred to [place], MD  [facility] Pager  [phone] 05/21/2014, 10:01 AM    LOS: 2 days

## 2014-05-21 NOTE — Progress Notes (Signed)
Subjective: Having BMs. +flatus. No abdominal pain, N/V. No GI complaints.   Objective: Vital signs in last 24 hours: Temp:  [97.8 F (36.6 C)-99.3 F (37.4 C)] 99.2 F (37.3 C) (06/16 0619) Pulse Rate:  [68-76] 76 (06/16 0619) Resp:  [20] 20 (06/16 0619) BP: (98-105)/(63-66) 98/66 mmHg (06/16 0619) SpO2:  [93 %-96 %] 93 % (06/16 0244) Last BM Date: 05/21/14 General:   Alert and oriented, pleasant Abdomen:  Bowel sounds present, soft, non-tender, non-distended. Ostomy, bag intact with air noted, +stool.  Psych:  Alert and cooperative. Normal mood and affect.  Intake/Output from previous day: 06/15 0701 - 06/16 0700 In: 720 [P.O.:720] Out: -  Intake/Output this shift: Total I/O In: 50 [IV Piggyback:50] Out: 450 [Urine:450]  Lab Results:  Recent Labs  05/19/14 0203 05/20/14 0440 05/21/14 0500  WBC 11.1* 11.3* 12.8*  HGB 12.6* 12.1* 12.5*  HCT 37.2* 35.9* 36.8*  PLT 150 158 173   BMET  Recent Labs  05/19/14 0203 05/20/14 0440 05/21/14 0500  NA 132* 132* 129*  K 4.7 4.5 4.4  CL 94* 92* 92*  CO2 22 25 20   GLUCOSE 347* 181* 172*  BUN 30* 26* 26*  CREATININE 1.70* 1.67* 1.66*  CALCIUM 9.0 9.1 9.1   LFT  Recent Labs  05/20/14 0440 05/21/14 0500  PROT 7.2 7.1  ALBUMIN 2.9* 3.0*  AST 34 32  ALT 28 26  ALKPHOS 51 52  BILITOT 1.9* 1.7*   PT/INR  Recent Labs  05/19/14 0204  LABPROT 18.7*  INR 1.61*     Studies/Results: Ct Abdomen Pelvis Wo Contrast  05/20/2014   CLINICAL DATA:  Short of breath.  Dizziness.  Nausea and vomiting.  EXAM: CT ABDOMEN AND PELVIS WITHOUT CONTRAST  TECHNIQUE: Multidetector CT imaging of the abdomen and pelvis was performed following the standard protocol without IV contrast.  COMPARISON:  CT 05/26/2010.  Radiographs 05/19/2014.  FINDINGS: Bones: No aggressive osseous lesions. Moderate thoracolumbar spondylosis. Calcified disc protrusion at L5-S1.  Lung Bases: Dependent atelectasis. Scarring in the right middle lobe and  lingula. Partially visualized pacemaker leads in the heart.  Liver: Unenhanced CT was performed per clinician order. Lack of IV contrast limits sensitivity and specificity, especially for evaluation of abdominal/pelvic solid viscera. Normal.  Spleen:  Normal.  Gallbladder: Contracted with cholelithiasis. No inflammatory changes by CT.  Common bile duct: No dilation or calcified common duct stone identified.  Pancreas:  Normal.  Adrenal glands:  Normal bilaterally.  Kidneys:  No calculi.  Both ureters appear within normal limits.  Stomach: Mild thickening of the distal esophagus, suggesting gastroesophageal reflux. The stomach is collapsed. No inflammatory changes.  Small bowel: Duodenum appears normal. There is no bowel dilation. No evidence of obstruction, which was suggested on the prior radiograph series. Normal opacification of small bowel. No mesenteric adenopathy.  Colon: Normal appendix. Colon is moderately distended with stool. Left lower quadrant end colostomy. Resection of the rectosigmoid with surgical clips and unchanged soft tissue in the presacral region.  Pelvic Genitourinary: Posterior traction of the urinary bladder, likely associated with scar tissue with an elongated appearance. No inflammatory changes.  Vasculature: Atherosclerosis.  Body Wall: Scarring in the midline of the abdominal wall. Tiny fat containing peristomal hernia.  IMPRESSION: 1. Negative for small bowel obstruction. 2. Cholelithiasis.  No CT evidence of cholecystitis. 3. Abdominoperineal resection. Uncomplicated left lower quadrant end colostomy. Moderate stool burden. 4. Mild thickening of the distal esophagus which can be associated with gastroesophageal reflux.   Electronically Signed  By: Dereck Ligas M.D.   On: 05/20/2014 19:18    Assessment: 58 year old male with history of stage III rectal carcinoma diagnosed in 2008, status post APR/chemotherapy radiation therapy who presented to hospital with SOB, Abd pain and c/o  no ostomy output for several days. CT without evidence of bowel obstruction and moderate stool burden noted. After contrast, multiple BMs had been noted. From a GI standpoint, doing well and stable.   Thickening of distal esophagus on CT: consider EGD at time of surveillance colonoscopy this fall 2015.   Now in ICU due to SVT, cardiology following.   GI will follow peripherally.     Plan: Avoid "cheese popcorn" (see 6/15 consult note) Outpatient colonoscopy +/- EGD with our practice We will arrange outpatient follow-up Follow peripherally  Orvil Feil, ANP-BC [facility]    LOS: 2 days    05/21/2014, 12:51 PM

## 2014-05-21 NOTE — Progress Notes (Signed)
MEDICATION RELATED CONSULT NOTE - INITIAL   Pharmacy Consult for review for potential interactions with amiodarone Indication: Pt started on amiodarone due to SVT  No Known Allergies  Patient Measurements: Height: 5\' 10"  (177.8 cm) Weight: 225 lb 9.6 oz (102.331 kg) IBW/kg (Calculated) : 73  Vital Signs: Temp: 98.3 F (36.8 C) (06/16 1130) Temp src: Oral (06/16 1130) BP: 81/50 mmHg (06/16 1400) Pulse Rate: 98 (06/16 1300) Intake/Output from previous day: 06/15 0701 - 06/16 0700 In: 720 [P.O.:720] Out: -  Intake/Output from this shift: Total I/O In: 550 [IV Piggyback:550] Out: 450 [Urine:450]  Labs:  Recent Labs  05/19/14 0203 05/19/14 0204 05/20/14 0440 05/21/14 0456 05/21/14 0500  WBC 11.1*  --  11.3*  --  12.8*  HGB 12.6*  --  12.1*  --  12.5*  HCT 37.2*  --  35.9*  --  36.8*  PLT 150  --  158  --  173  APTT  --  36  --   --   --   CREATININE 1.70*  --  1.67*  --  1.66*  MG  --   --   --  1.8  --   ALBUMIN  --   --  2.9*  --  3.0*  PROT  --   --  7.2  --  7.1  AST  --   --  34  --  32  ALT  --   --  28  --  26  ALKPHOS  --   --  51  --  52  BILITOT  --   --  1.9*  --  1.7*   Estimated Creatinine Clearance: 57.4 ml/min (by C-G formula based on Cr of 1.66).  Medical History: Past Medical History  Diagnosis Date  . Essential hypertension, benign   . Type 2 diabetes mellitus   . Coronary atherosclerosis of native coronary artery     BMS to LAD 2001 at [facility], PTCA/atherectomy ramus and BMS to LAD 2009  . S/P colonoscopy     Normal via ostomy - September 2009  . Chronic systolic heart failure   . Myocardial infarction, anterior wall     T[facility] with [facility]PA at [facility]  . GERD (gastroesophageal reflux disease)   . Cardiomyopathy, ischemic     BIV ICD [employer] Jude, LVEF 23%  . Paroxysmal atrial fibrillation     Amiodarone and Coumadin  . 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 Prostate cancer     s/p seed implants with chemo and radiation  . Dual ICD  (implantable cardioverter-defibrillator) in place   . Transaminitis 12/12/2013  . CHF (congestive heart failure)   . TIA (transient ischemic attack)    Medications:  Scheduled:  . apixaban  5 mg Oral BID  . aspirin EC  81 mg Oral QHS  . citalopram  20 mg Oral Daily  . digoxin  0.125 mg Oral QODAY  . enalapril  10 mg Oral Daily  . insulin aspart  12 Units Subcutaneous Q lunch  . insulin aspart  14 Units Subcutaneous BID WC  . insulin glargine  24 Units Subcutaneous QHS  . isosorbide dinitrate  15 mg Oral BID  . metoprolol succinate  100 mg Oral Daily  . niacin  1,000 mg Oral QHS  . pantoprazole  40 mg Oral Daily  . ranolazine  1,000 mg Oral BID  . sorbitol  30 mL Oral BID   Assessment: 58yo male with h/o colorectal cancer.  Pt in ICU and started on IV amiodarone due to SVT.  Current medications listed above, potential interactions with amiodarone listed below:    Citalopram >> may increase risk of QT prolongation and cardiac arrhythmias.  Recommended to avoid combo or use alternative.  Ondansetron >>  may increase risk of QT prolongation and cardiac arrhythmias.  Recommended to avoid combo or use alternative.  Ranolazine >>  Combo may increase levels of both drugs which may increase risk of QT prolongation and cardiac arrhythmias.  Recommended to avoid combo unless benefit outweighs risk.  Maximum recommended dose of Ranolazine is 1000mg  per DAY.  Digoxin >>  Combo may increase digoxin levels and risk of toxicity.  Recommended to decrease digoxin dose by 30-50% and monitor levels.  (of note digoxin level was subtherapeutic on admission)  Metoprolol >>  Monitor BP and HR.  Combo may increase beta blocker levels and risk of hypotension, bradycardia, sinus arrest, AV block or other adverse effects.    Apixaban >>  Combo may increase apixaban levels and risk of bleeding.  Caution advised.  Monitor closely.    Goal of Therapy:  Reduce risk of adverse effects  Plan:   Monitor patient  closely.  Reduce medications and / or doses as deemed appropriate, defer to MD  F/U cardiology input and recommendations  Hart Robinsons A 05/21/2014,2:49 PM

## 2014-05-21 NOTE — Progress Notes (Signed)
REVIEWED. CONSIDER EGD PRIOR TO DISCHARGE. SURVEILLANCE TCS SEP 2015.

## 2014-05-21 NOTE — Progress Notes (Signed)
Patient went into a sustained SVT. Patient unsymptomatic. Dr Verlon Au notified. Stepdown transfer ordered for amiodarone drip. Report given to Craig Beach RN. Cardiology consulted.

## 2014-05-21 NOTE — Progress Notes (Signed)
Central telemetry called about patient having frequent PVC's with runs of 3 or 4. Informed her that a pacemaker does not control PVC's. Informed Central tele that patient actually has a AICD not just a pacer.

## 2014-05-21 NOTE — Consult Note (Signed)
CARDIOLOGY CONSULT NOTE   Patient ID: [name] MRN: [id number] DOB/AGE: 60/03/1956 60 y.o.  Admit Date: 05/19/2014 Referring Physician: PTH Primary Physician: Glo Herring., MD Consulting Cardiologist: Carlyle Dolly MD Primary Cardiologist: Carlyle Dolly MD Reason for Consultation: Chest Pain  Clinical Summary [name] is a 58 y.o.male with known history of ischemic cardiomyopathy, CAD, with prior bare-metal stent to the LAD in 2001, repeat bare-metal stent to the LAD in 2009, with most recent LVEF of15% per echo in 2015, history of St. Jude  ICD, hypertension, PAF on anticoagulation therapy and amiodarone, hyperlipidemia, admitted with worsening shortness of breath and chest pressure.   Patient states that he had abdominal pain which came first unable to have a bowel movement through his colostomy, and when he tried to bear down he had significant shortness of breath and chest pressure. He is since been having normal bowel movements, and has had no recurrence of symptoms. Of note, the patient states eats a bag of popcorn every day, but has been compliant with medications.. Secondly, he is followed by a Dr. Andree Elk in [facility] for heart failure.  Other history includes rectal adenocarcinoma diagnosed in 2008 and treated with chemoradiation, and underwent APR post chemotherapy, and is followed by Dr. Sydell Axon GI. He has an ostomy, but has had complaints no bowel output over 6 days.  In ER the patient's blood pressure is 110/63 heart rate 71 O2 sat 96% with a temperature 97.0. She was not done at be anemic, but there was leukocytosis with white blood cells 11.1. Platelets 150. Sodium was 132, with a chloride of 94 BUN 30 creatinine 1.70. Initial troponin less than 0.30. Pro BNP was elevated at 2631. Dig level less than 0.3. INR 1.61 Chest x-ray revealed mild bibasilar airspace opacities reflecting atelectasis or possibly pneumonia. No CHF EKG revealed atrial flutter with right  bundle-branch block. Rate 70 beats per minute. He was treated with 80 mg of Lasix x1 and aspirin 324 mg. A followup CT scan of the abdomen revealed negative for small bowel obstruction, history of cholelithiasis without cholecystitis. Moderate stool burden.    No Known Allergies  Medications Scheduled Medications: . apixaban  5 mg Oral BID  . aspirin EC  81 mg Oral QHS  . citalopram  20 mg Oral Daily  . digoxin  0.125 mg Oral QODAY  . enalapril  10 mg Oral Daily  . furosemide  20 mg Oral Daily  . insulin aspart  12 Units Subcutaneous Q lunch  . insulin aspart  14 Units Subcutaneous BID WC  . insulin glargine  24 Units Subcutaneous QHS  . isosorbide dinitrate  15 mg Oral BID  . magnesium sulfate 1 - 4 g bolus IVPB  2 g Intravenous Once  . metoprolol succinate  100 mg Oral Daily  . niacin  1,000 mg Oral QHS  . pantoprazole  40 mg Oral Daily  . ranolazine  1,000 mg Oral BID  . sorbitol  30 mL Oral BID  . spironolactone  25 mg Oral Daily    Infusions: . amiodarone 60 mg/hr (05/21/14 0948)   Followed by  . amiodarone      PRN Medications: fluticasone, meclizine, ondansetron (ZOFRAN) IV   Past Medical History  Diagnosis Date  . Essential hypertension, benign   . Type 2 diabetes mellitus   . Coronary atherosclerosis of native coronary artery     BMS to LAD 2001 at [facility], PTCA/atherectomy ramus and BMS to LAD 2009  . S/P  colonoscopy     Normal via ostomy - September 2009  . Chronic systolic heart failure   . Myocardial infarction, anterior wall     Treated with tPA at [facility] 2000  . GERD (gastroesophageal reflux disease)   . Cardiomyopathy, ischemic     BIV ICD St. Jude, LVEF 23%  . Paroxysmal atrial fibrillation     Amiodarone and Coumadin  . 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 Prostate cancer     s/p seed implants with chemo and radiation  . 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 Transaminitis 12/12/2013  . CHF (congestive heart  failure)   . TIA (transient ischemic attack)     Past Surgical History  Procedure Laterality Date  . Internal defibrillator and pacemaker  2010    x2 St. Jude device  . Abdominal and perineal resection of rectum with total mesorectal excision      10/04/2007  . Colonoscopy      05/17/2007. IMPRESSION: Semilunar, apple-core neoplasm low in the rectum (palpable on digital rectal exam) beginning at 5 cm and corkscrewing up 5 cm in length. This was a low rectal lesion consistent with colorectal carcinoma. It was biosied multiple times. The upstream colon all the way to the cecum appeared normal. Recommendations: Followup on path. Surgical Consultation   . Colonoscopy  09/14/2011    Dr. Gala Romney: via colostomy, Single pedunculated benign inflammatory polyp. Due for surveillance Oct 2015  . Colostomy      Family History  Problem Relation Age of Onset  . Colon cancer Mother 30  . Colon cancer Sister 40  . Coronary artery disease Father   . Colon cancer Other     2 cousins, succumbed to illness    Social History [name] reports that he has never smoked. He has never used smokeless tobacco. [name] reports that he does not drink alcohol.  Review of Systems Otherwise reviewed and negative except as outlined.  Physical Examination Blood pressure 98/66, pulse 76, temperature 99.2 F (37.3 C), temperature source Oral, resp. rate 20, height 5\' 10"  (1.778 m), weight 225 lb 9.6 oz (102.331 kg), SpO2 93.00%.  Intake/Output Summary (Last 24 hours) at 05/21/14 1007 Last data filed at 05/20/14 1753  Gross per 24 hour  Intake    480 ml  Output      0 ml  Net    480 ml    Telemetry: Atrial flutter with paced rhythm.  GEN: HEENT: Conjunctiva and lids normal, oropharynx clear with moist mucosa. Neck: Supple, no elevated JVP or carotid bruits, no thyromegaly. Lungs: Clear to auscultation, nonlabored breathing at rest. Cardiac: Regular rate and rhythm, no S3 or significant systolic murmur,  no pericardial rub. Abdomen: Soft, nontender, no hepatomegaly, bowel sounds present, no guarding or rebound. Extremities: No pitting edema, distal pulses 2+. Skin: Warm and dry. Musculoskeletal: No kyphosis. Neuropsychiatric: Alert and oriented x3, affect grossly appropriate.  Prior Cardiac Testing/Procedures  1. Echocardiogram 03/01/2014 Procedure narrative: Transthoracic echocardiography. Image quality was suboptimal. The study was technically difficult, as a result of poor sound wave transmission. - Left ventricle: Systolic function is severely reduced, estimated EF 15-20%. Severe diffuse hypokinesis is noted. The cavity size was moderately dilated. Wall thickness was increased in a pattern of moderate LVH. Diastolic dysfunction is seen, indeterminate grade. The apex was poorly visualized. Doppler parameters are consistent with high ventricular filling pressure. - Ventricular septum: Septal motion showed abnormal function and dyssynergy. These changes are consistent with  right ventricular pacing. - Aortic valve: Trileaflet; mildly thickened leaflets. There was no stenosis. - Mitral valve: Mildly thickened leaflets . Mild tethering of leaflet motion due to severe left ventricular dysfunction. Mild regurgitation. - Left atrium: The atrium was mildly to moderately dilated. - Right ventricle: The cavity size was normal. Wall thickness was normal. Pacer wire or catheter noted in right ventricle. Systolic function was mildly to moderately reduced. - Right atrium: Pacer wire or catheter noted in right atrium. - Atrial septum: The septum bowed from left to right, consistent with increased left atrial pressure. - Tricuspid valve: Mild regurgitation. - Pulmonary arteries: PA peak pressure: 27mm Hg (S). Mildly elevated pulmonary pressures.  2.Cardiac Cath 07/2013 1. Left main; normal  2. LAD; the entire proximal third of the LAD was fluoroscopically calcified. The proximal third had  approximately 50-60% segmental stenosis. The stent was widely patent in the proximal third of the LAD with 30-40% in-stent restenosis". There was a moderate size first diagonal branch and the ramus distribution that was occluded in its proximal portion and filled by collaterals. This was noted to be highly diseased at his last cath in 2009. There was 50-60% segmental stenosis in the middle third and 70% in the distal/apical third. There were 2 small marginal branches arising from the middle third that had 90% ostial stenoses unchanged from prior cath  3. Left circumflex; dominant with 99% long segmental proximal OM1 stenosis in a small to medium-size vessel unchanged from prior cath  4. Right coronary artery; nondominant with 50% mid and 80% distal stenosis unchanged from prior cath  5. Left ventriculography; not performed today to conserve contrast  Lab Results  Basic Metabolic Panel:  Recent Labs Lab 05/19/14 0203 05/20/14 0440 05/21/14 0456 05/21/14 0500  NA 132* 132*  --  129*  K 4.7 4.5  --  4.4  CL 94* 92*  --  92*  CO2 22 25  --  20  GLUCOSE 347* 181*  --  172*  BUN 30* 26*  --  26*  CREATININE 1.70* 1.67*  --  1.66*  CALCIUM 9.0 9.1  --  9.1  MG  --   --  1.8  --     Liver Function Tests:  Recent Labs Lab 05/20/14 0440 05/21/14 0500  AST 34 32  ALT 28 26  ALKPHOS 51 52  BILITOT 1.9* 1.7*  PROT 7.2 7.1  ALBUMIN 2.9* 3.0*    CBC:  Recent Labs Lab 05/19/14 0203 05/20/14 0440 05/21/14 0500  WBC 11.1* 11.3* 12.8*  NEUTROABS 9.7* 8.8* 10.5*  HGB 12.6* 12.1* 12.5*  HCT 37.2* 35.9* 36.8*  MCV 84.4 84.3 83.4  PLT 150 158 173    Cardiac Enzymes:  Recent Labs Lab 05/19/14 0203 05/19/14 0710 05/19/14 1258 05/19/14 1915 05/21/14 0456  CKTOTAL  --   --   --   --  39  CKMB  --   --   --   --  PENDING  TROPONINI <0.30 <0.30 <0.30 <0.30  --      Radiology: Ct Abdomen Pelvis Wo Contrast  05/20/2014   CLINICAL DATA:  Short of breath.  Dizziness.  Nausea  and vomiting.  EXAM: CT ABDOMEN AND PELVIS WITHOUT CONTRAST  TECHNIQUE:   IMPRESSION: 1. Negative for small bowel obstruction. 2. Cholelithiasis.  No CT evidence of cholecystitis. 3. Abdominoperineal resection. Uncomplicated left lower quadrant end colostomy. Moderate stool burden. 4. Mild thickening of the distal esophagus which can be associated with gastroesophageal reflux.   Electronically  Signed   By: Dereck Ligas M.D.   On: 05/20/2014 19:18   Dg Abd Acute W/chest  05/19/2014   CLINICAL DATA:  Abdominal discomfort.  Constipated.  EXAM: ACUTE ABDOMEN SERIES (ABDOMEN 2 VIEW & CHEST 1 VIEW)  COMPARISON:  Chest x-ray 05/19/2014  FINDINGS: Left AICD remains in place, unchanged. Cardiomegaly. No confluent opacities, effusions or edema.  Large stool burden throughout the colon. Mildly prominent left abdominal small bowel loops with scattered air-fluid levels. No free air organomegaly. No suspicious calcification. Degenerative changes in the lumbar spine and hips.  IMPRESSION: Large stool burden.  Mildly prominent left abdominal small bowel loops. While this may reflect focal ileus, I cannot exclude early small bowel obstruction.  Cardiomegaly.   Electronically Signed   By: Rolm Baptise M.D.   On: 05/19/2014 11:54     ECG: Atrial flutter with right bundle branch block, rate of 70 beats per minute   Impression and Recommendations 1. Chest Pain: Associated with bearing down to have bowel movements, described as pressure, with some mild shortness of breath. Cardiac enzymes are found be negative ruling out ACS. Most recent cardiac catheterization revealed ischemic cardiomyopathy with a widely patent proximal LAD stent and moderate segmental calcified proximal LAD stenosis. His first diagonal was totally occluded and diffusely diseased. Does not appear to be cardiac in etiology, continue current medical therapy.   2. ICM: Most recent echocardiogram revealed an EF of 15%. He is followed by a Dr. Andree Elk in  [facility] for CHF management. The patient has been medically compliant, but admits to dietary noncompliance eating salty foods. He did not have a significant urine output per minute the Lasix in the ER. However he is breathing better this no further complaints of chest pain. Chest x-ray did not reveal CHF. He will continue on digoxin, and Monopril, Lasix 20 mg daily, spironolactone, and metoprolol. He is noted to be hyponatremic. May need to consider holding spironolactone temporarily. Potassium 4.4, creatinine 1.66. Lasix is currently on hold.  3. St. Jude ICD pacemaker in situ: Pacemaker was interrogated in our office 3 days ago, and this revealed predominant rhythm as atrial flutter, right atrium was paced 12% of the time right ventricle is a 78% of the time. The patient is currently receiving amiodarone IV bolus. There is no evidence of ventricular tachycardia.  4. Atrial flutter: Heart rate is well-controlled currently. He has been changed from Coumadin to a Eliquis per Dr. Andree Elk approximately one month ago  5. Rectal carcinoma: He has received chemotherapy and currently has a colostomy. Concerns for hyponatremia and and recent recurrence of cancer vs. overdiuresis. Followup per oncology and primary care. Patient states he has been losing weight without trying. Consider PET scan.     Signed: Phill Myron. Lawrence NP  05/21/2014, 10:07 AM Co-Sign MD  Patient seen and discussed with NP Purcell Nails, I agree with her documentation above. 58 yo male history of CAD with prior stenting, chronic systolic heart failure LVEF 15-20% by echo 08/2013 with BiV AICD, afib/aflutter on coumadin, and rectal CA admitted with . Last cath 07/2013 with prox LAD 50-60% with patent prox stent, occluded diag with noted severe disease from prior cath. LCX 99% long segment prox OM1 lesion unchanged, RCA 50% mid and 80% distal. Overall stable disease that was medically managed. Echo 02/2014 LVEF 15-20%. Device check 05/17/14  normal function, no ventricular arrhythmias.    He was admitted with adbominal pain and nausea and decreased ostomy output. He is being managed by primary  team and GI. It seems after CT contrast he had multiple BMs with improved abdominal symptoms. During admission noted to go into a wide complex tachycardia rates in 110s, asymptomatic with stable blood pressure. Based on initial evaluation of telemetry and given his known cardiomyopathy there was concern for slow VT, patient transferred to ICU and started on amio drip. EKG Mg 1.8 K 4.4 Cr 1.66. 12 lead EKG shows wide complex regular tachycardia with very wide RBBB pattern and severe RAD that is actually identical to his non-paced native QRS complex (seen in multiple prior EKGs including 02/28/14). Further tele reviewed, rhythm appears to be SVT (probably aflutter) with his underlying wide RBBB/aberrancy. Will continue amio overnight for flutter, continue anticoag. Continue other cardiac meds.   Carlyle Dolly MD

## 2014-05-21 NOTE — Progress Notes (Signed)
    ERROR Cancelled Appointment

## 2014-05-22 ENCOUNTER — Telehealth: Payer: Self-pay | Admitting: Gastroenterology

## 2014-05-22 DIAGNOSIS — E119 Type 2 diabetes mellitus without complications: Secondary | ICD-10-CM

## 2014-05-22 DIAGNOSIS — Z7901 Long term (current) use of anticoagulants: Secondary | ICD-10-CM

## 2014-05-22 DIAGNOSIS — N179 Acute kidney failure, unspecified: Secondary | ICD-10-CM

## 2014-05-22 DIAGNOSIS — I4892 Unspecified atrial flutter: Secondary | ICD-10-CM

## 2014-05-22 DIAGNOSIS — E1165 Type 2 diabetes mellitus with hyperglycemia: Secondary | ICD-10-CM

## 2014-05-22 DIAGNOSIS — E1129 Type 2 diabetes mellitus with other diabetic kidney complication: Secondary | ICD-10-CM

## 2014-05-22 DIAGNOSIS — I5023 Acute on chronic systolic (congestive) heart failure: Secondary | ICD-10-CM

## 2014-05-22 DIAGNOSIS — Z933 Colostomy status: Secondary | ICD-10-CM

## 2014-05-22 LAB — COMPREHENSIVE METABOLIC PANEL
ALT: 31 U/L (ref 0–53)
AST: 48 U/L — AB (ref 0–37)
Albumin: 2.6 g/dL — ABNORMAL LOW (ref 3.5–5.2)
Alkaline Phosphatase: 55 U/L (ref 39–117)
BILIRUBIN TOTAL: 1.1 mg/dL (ref 0.3–1.2)
BUN: 22 mg/dL (ref 6–23)
CHLORIDE: 96 meq/L (ref 96–112)
CO2: 24 meq/L (ref 19–32)
CREATININE: 1.49 mg/dL — AB (ref 0.50–1.35)
Calcium: 8.7 mg/dL (ref 8.4–10.5)
GFR calc Af Amer: 58 mL/min — ABNORMAL LOW (ref >90)
GFR calc non Af Amer: 50 mL/min — ABNORMAL LOW (ref >90)
Glucose, Bld: 123 mg/dL — ABNORMAL HIGH (ref 70–99)
POTASSIUM: 4 meq/L (ref 3.7–5.3)
SODIUM: 134 meq/L — AB (ref 137–147)
Total Protein: 6.7 g/dL (ref 6.0–8.3)

## 2014-05-22 LAB — MAGNESIUM: Magnesium: 2.4 mg/dL (ref 1.5–2.5)

## 2014-05-22 LAB — GLUCOSE, CAPILLARY
Glucose-Capillary: 94 mg/dL (ref 70–99)
Glucose-Capillary: 144 mg/dL — ABNORMAL HIGH (ref 70–99)
Glucose-Capillary: 115 mg/dL — ABNORMAL HIGH (ref 70–99)
Glucose-Capillary: 105 mg/dL — ABNORMAL HIGH (ref 70–99)

## 2014-05-22 MED ORDER — PIPERACILLIN-TAZOBACTAM 3.375 G IVPB
3.3750 g | Freq: Three times a day (TID) | INTRAVENOUS | Status: DC
Start: 2014-05-22 — End: 2014-05-23
  Administered 2014-05-22 – 2014-05-23 (×3): 3.375 g via INTRAVENOUS
  Filled 2014-05-22 (×3): qty 50

## 2014-05-22 MED ORDER — AMIODARONE HCL 200 MG PO TABS
200.0000 mg | ORAL_TABLET | Freq: Every day | ORAL | Status: DC
  Administered 2014-05-22 – 2014-05-23 (×2): 200 mg via ORAL
  Filled 2014-05-22 (×2): qty 1

## 2014-05-22 MED ORDER — SODIUM CHLORIDE 0.9 % IJ SOLN
3.0000 mL | INTRAMUSCULAR | Status: DC | PRN
  Administered 2014-05-22: 3 mL via INTRAVENOUS

## 2014-05-22 MED ORDER — VANCOMYCIN HCL IN DEXTROSE 1-5 GM/200ML-% IV SOLN
1000.0000 mg | Freq: Two times a day (BID) | INTRAVENOUS | Status: DC
  Administered 2014-05-22 – 2014-05-23 (×2): 1000 mg via INTRAVENOUS
  Filled 2014-05-22 (×2): qty 200

## 2014-05-22 NOTE — Telephone Encounter (Signed)
Please have patient come see [place] in about 2 weeks. Can we tentatively hold a spot for an EGD with Dr. Gala Romney in June in the meantime?

## 2014-05-22 NOTE — Progress Notes (Signed)
Following patient peripherally. Due to CT findings of thickened distal esophagus, recommend EGD as outpatient in June. Will arrange follow-up in our office for this. Surveillance colonoscopy due fall 2015.  Orvil Feil, ANP-BC [facility]

## 2014-05-22 NOTE — Progress Notes (Signed)
Report called to L.Covington,RN. Patient transferred to 316 in stable condition via wheelchair.

## 2014-05-22 NOTE — Progress Notes (Addendum)
[facility]                                                                              Patient Demographics  [name], is a 58 y.o. male, DOB - August 29, 1956, VOH:[id number]  Admit date - 05/19/2014   Admitting Physician Phillips Grout, MD  Outpatient Primary MD for the patient is Glo Herring., MD  LOS - 3   Chief Complaint  Patient presents with  . Shortness of Breath      HPI:  58 yo male with history of CAD, systolic CHF with EF about 20%, HTN, AICD presented with shortness of breath. No cough. No fevers. No swelling.  No pnd or orthopnea. No chest pain. Does not require oxygen at home. With his previous heart attacks he had chest pressure and shortness of breath. His shortness of breath had resolved upon arrival. Asked to obs for concern of anginal equivalent.    Assessment & Plan  Chest pain/ coronary artery disease -ACS ruled out, and troponins remained negative -Cardiac catheterization in August of 2014 revealed ischemic cardiomyopathy with a widely patent proximal LAD stent and moderate segment calcified proximal LAD stenosis -Cardiology consulted and recommended continued medical therapy -Continue Ranolazine and aspirin  Ischemic cardiomyopathy -EF of 15% on echocardiogram in March 2015 -Patient sees Dr. Andree Elk in [place] for CHF management  Atrial flutter with right bundle branch block aberrancy/questionable VT -Cardiology following and appreciated -Patient was placed on an amiodarone drip -Cardiology has this patient over to amiodarone by mouth -Cardiology recommended holding diuretics due to his creatinine, as well as nitrates due to his soft blood pressures however to continue beta blocker -Will continue digoxin, metoprolol, -Continue Eliquis for anticoagulation  History rectal carcinoma -Status post colectomy and ostomy, patient received chemotherapy as well as radiation -Continue outpatient followup  Type 2 diabetes mellitus -Continue  Lantus 24 units as well as insulin sliding scale and CBG monitoring -Last hemoglobin A1c in March 2015: 10.5  Depression -Continue Celexa  Chronic kidney disease, stage II -At baseline, will continue to monitor  Hyponatremia -Improving  Constipation -Patient had been constipated for approximately 6 days before receiving contrast for CT scan, which caused him to pass large amount of stools. -Patient has been counseled on his eating habits concerning he eats a lot of popcorn. -GI has been consulted and recommended outpatient EGD for esophageal thickening as well as colonoscopy.  Right upper extremity cellulitis -Will start patient on IV vancomycin and zosyn -patient was on PO Keflex   Code Status: Full  Family Communication: None at bedside  Disposition Plan: Admitted, will move to medical floor  Time Spent in minutes   30 minutes  Procedures  None  Consults   Cardiology Gastroenterology  DVT Prophylaxis  Eliquis  Lab Results  Component Value Date   PLT 173 05/21/2014    Medications  Scheduled Meds: . apixaban  5 mg Oral BID  . aspirin EC  81 mg Oral QHS  . cephALEXin  500 mg Oral Q12H  . citalopram  20 mg Oral Daily  . digoxin  0.125 mg Oral QODAY  . enalapril  10 mg Oral Daily  . insulin aspart  12  Units Subcutaneous Q lunch  . insulin aspart  14 Units Subcutaneous BID WC  . insulin glargine  24 Units Subcutaneous QHS  . isosorbide dinitrate  15 mg Oral BID  . metoprolol succinate  50 mg Oral Daily  . niacin  1,000 mg Oral QHS  . pantoprazole  40 mg Oral Daily  . ranolazine  1,000 mg Oral Daily  . sorbitol  30 mL Oral BID   Continuous Infusions: . amiodarone 30 mg/hr (05/22/14 0500)   PRN Meds:.fluticasone, meclizine  Antibiotics    Anti-infectives   Start     Dose/Rate Route Frequency Ordered Stop   05/21/14 2200  cephALEXin (KEFLEX) capsule 500 mg     500 mg Oral Every 12 hours 05/21/14 1558         Subjective:   [name] seen and  examined today.  Patient states his breathing has improved and he is no longer feeling shortness of breath.  He no longer has abdominal pain, vomiting, or diarrhea.  He does complain of right arm pain.     Objective:   Filed Vitals:   05/22/14 0400 05/22/14 0500 05/22/14 0600 05/22/14 0800  BP:  90/53 88/68   Pulse:  99    Temp: 97.9 F (36.6 C)   98 F (36.7 C)  TempSrc: Oral   Oral  Resp:  18 15   Height:      Weight:  100 kg (220 lb 7.4 oz)    SpO2:  98%      Wt Readings from Last 3 Encounters:  05/22/14 100 kg (220 lb 7.4 oz)  04/05/14 98.884 kg (218 lb)  03/06/14 99.519 kg (219 lb 6.4 oz)     Intake/Output Summary (Last 24 hours) at 05/22/14 0846 Last data filed at 05/22/14 0500  Gross per 24 hour  Intake 1077.77 ml  Output   2150 ml  Net -1072.23 ml    Exam  General: Well developed, well nourished, NAD, appears stated age  HEENT: NCAT, PERRLA, EOMI, Anicteic Sclera, mucous membranes moist.   Neck: Supple, no JVD, no masses  Cardiovascular: S1 S2 auscultated, 2/6 SEM, irregularly irregular  Respiratory: Clear to auscultation bilaterally with equal chest rise  Abdomen: Soft, nontender, nondistended, + bowel sounds  Extremities: warm dry without cyanosis clubbing or edema in LE B/L, RUE erythema and swelling  Neuro: AAOx3, cranial nerves grossly intact. Strength 5/5 in patient's upper and lower extremities bilaterally  Skin: Erythema and swelling of the right upper extremity  Psych: Normal affect and demeanor with intact judgement and insight   Data Review   Micro Results Recent Results (from the past 240 hour(s))  MRSA PCR SCREENING     Status: None   Collection Time    05/21/14  9:15 AM      Result Value Ref Range Status   MRSA by PCR NEGATIVE  NEGATIVE Final   Comment:            The GeneXpert MRSA Assay (FDA     approved for NASAL specimens     only), is one component of a     comprehensive MRSA colonization     surveillance program. It is  not     intended to diagnose MRSA     infection nor to guide or     monitor treatment for     MRSA infections.    Radiology Reports Ct Abdomen Pelvis Wo Contrast  05/20/2014   CLINICAL DATA:  Short of breath.  Dizziness.  Nausea and vomiting.  EXAM: CT ABDOMEN AND PELVIS WITHOUT CONTRAST  TECHNIQUE: Multidetector CT imaging of the abdomen and pelvis was performed following the standard protocol without IV contrast.  COMPARISON:  CT 05/26/2010.  Radiographs 05/19/2014.  FINDINGS: Bones: No aggressive osseous lesions. Moderate thoracolumbar spondylosis. Calcified disc protrusion at L5-S1.  Lung Bases: Dependent atelectasis. Scarring in the right middle lobe and lingula. Partially visualized pacemaker leads in the heart.  Liver: Unenhanced CT was performed per clinician order. Lack of IV contrast limits sensitivity and specificity, especially for evaluation of abdominal/pelvic solid viscera. Normal.  Spleen:  Normal.  Gallbladder: Contracted with cholelithiasis. No inflammatory changes by CT.  Common bile duct: No dilation or calcified common duct stone identified.  Pancreas:  Normal.  Adrenal glands:  Normal bilaterally.  Kidneys:  No calculi.  Both ureters appear within normal limits.  Stomach: Mild thickening of the distal esophagus, suggesting gastroesophageal reflux. The stomach is collapsed. No inflammatory changes.  Small bowel: Duodenum appears normal. There is no bowel dilation. No evidence of obstruction, which was suggested on the prior radiograph series. Normal opacification of small bowel. No mesenteric adenopathy.  Colon: Normal appendix. Colon is moderately distended with stool. Left lower quadrant end colostomy. Resection of the rectosigmoid with surgical clips and unchanged soft tissue in the presacral region.  Pelvic Genitourinary: Posterior traction of the urinary bladder, likely associated with scar tissue with an elongated appearance. No inflammatory changes.  Vasculature: Atherosclerosis.   Body Wall: Scarring in the midline of the abdominal wall. Tiny fat containing peristomal hernia.  IMPRESSION: 1. Negative for small bowel obstruction. 2. Cholelithiasis.  No CT evidence of cholecystitis. 3. Abdominoperineal resection. Uncomplicated left lower quadrant end colostomy. Moderate stool burden. 4. Mild thickening of the distal esophagus which can be associated with gastroesophageal reflux.   Electronically Signed   By: Dereck Ligas M.D.   On: 05/20/2014 19:18   Dg Chest 2 View  05/19/2014   CLINICAL DATA:  Shortness of breath.  EXAM: CHEST  2 VIEW  COMPARISON:  Chest radiograph performed 02/28/2014  FINDINGS: The lungs are well-aerated. Mild bibasilar opacities may reflect atelectasis or possibly mild pneumonia. There is no evidence of focal opacification, pleural effusion or pneumothorax.  The heart is borderline normal in size. A pacemaker/AICD is noted at the left chest wall, with leads ending at the right atrium, right ventricle and coronary sinus. No acute osseous abnormalities are seen.  IMPRESSION: Mild bibasilar airspace opacities may reflect atelectasis or possibly pneumonia.   Electronically Signed   By: Garald Balding M.D.   On: 05/19/2014 03:13   Dg Abd Acute W/chest  05/19/2014   CLINICAL DATA:  Abdominal discomfort.  Constipated.  EXAM: ACUTE ABDOMEN SERIES (ABDOMEN 2 VIEW & CHEST 1 VIEW)  COMPARISON:  Chest x-ray 05/19/2014  FINDINGS: Left AICD remains in place, unchanged. Cardiomegaly. No confluent opacities, effusions or edema.  Large stool burden throughout the colon. Mildly prominent left abdominal small bowel loops with scattered air-fluid levels. No free air organomegaly. No suspicious calcification. Degenerative changes in the lumbar spine and hips.  IMPRESSION: Large stool burden.  Mildly prominent left abdominal small bowel loops. While this may reflect focal ileus, I cannot exclude early small bowel obstruction.  Cardiomegaly.   Electronically Signed   By: Rolm Baptise  M.D.   On: 05/19/2014 11:54    CBC  Recent Labs Lab 05/19/14 0203 05/20/14 0440 05/21/14 0500  WBC 11.1* 11.3* 12.8*  HGB 12.6* 12.1* 12.5*  HCT 37.2* 35.9* 36.8*  PLT 150 158 173  MCV 84.4 84.3 83.4  MCH 28.6 28.4 28.3  MCHC 33.9 33.7 34.0  RDW 17.3* 17.8* 17.3*  LYMPHSABS 0.7 1.3 1.2  MONOABS 0.6 1.2* 1.0  EOSABS 0.1 0.1 0.1  BASOSABS 0.0 0.0 0.0    Chemistries   Recent Labs Lab 05/19/14 0203 05/20/14 0440 05/21/14 0456 05/21/14 0500 05/22/14 0500  NA 132* 132*  --  129* 134*  K 4.7 4.5  --  4.4 4.0  CL 94* 92*  --  92* 96  CO2 22 25  --  20 24  GLUCOSE 347* 181*  --  172* 123*  BUN 30* 26*  --  26* 22  CREATININE 1.70* 1.67*  --  1.66* 1.49*  CALCIUM 9.0 9.1  --  9.1 8.7  MG  --   --  1.8  --  2.4  AST  --  34  --  32 48*  ALT  --  28  --  26 31  ALKPHOS  --  51  --  52 55  BILITOT  --  1.9*  --  1.7* 1.1   ------------------------------------------------------------------------------------------------------------------ estimated creatinine clearance is 63.3 ml/min (by C-G formula based on Cr of 1.49). ------------------------------------------------------------------------------------------------------------------ No results found for this basename: HGBA1C,  in the last 72 hours ------------------------------------------------------------------------------------------------------------------ No results found for this basename: CHOL, HDL, LDLCALC, TRIG, CHOLHDL, LDLDIRECT,  in the last 72 hours ------------------------------------------------------------------------------------------------------------------ No results found for this basename: TSH, T4TOTAL, FREET3, T3FREE, THYROIDAB,  in the last 72 hours ------------------------------------------------------------------------------------------------------------------ No results found for this basename: VITAMINB12, FOLATE, FERRITIN, TIBC, IRON, RETICCTPCT,  in the last 72 hours  Coagulation  profile  Recent Labs Lab 05/19/14 0204  INR 1.61*    No results found for this basename: DDIMER,  in the last 72 hours  Cardiac Enzymes  Recent Labs Lab 05/19/14 0710 05/19/14 1258 05/19/14 1915 05/21/14 0456  CKMB  --   --   --  <1.0  TROPONINI <0.30 <0.30 <0.30  --    ------------------------------------------------------------------------------------------------------------------ No components found with this basename: POCBNP,     MIKHAIL, MARYANN D.O. on 05/22/2014 at 8:46 AM  Between 7am to 7pm - Pager - [phone]  After 7pm go to [email or web address] - password [email or web address]  And look for the night coverage person covering for me after hours  [facility] Office  [phone]

## 2014-05-22 NOTE — Progress Notes (Signed)
Automatic nbp reading of 80'E systolically  Twice by monitor. Recheck manually bp in same arm and get reading of 90/53. Will monitor closely

## 2014-05-22 NOTE — Progress Notes (Signed)
Subjective:  No further chest pain. Breathing fine. Feels better today. BP readings low, but manually 90/60 .  Objective:  Vital Signs in the last 24 hours: Temp:  [97.9 F (36.6 C)-98.7 F (37.1 C)] 98 F (36.7 C) (06/17 0800) Pulse Rate:  [95-100] 99 (06/17 0500) Resp:  [15-22] 16 (06/17 0800) BP: (80-99)/(40-70) 92/68 mmHg (06/17 0902) SpO2:  [89 %-98 %] 98 % (06/17 0500) Weight:  [220 lb 7.4 oz (100 kg)] 220 lb 7.4 oz (100 kg) (06/17 0500)  Intake/Output from previous day: 06/16 0701 - 06/17 0700 In: 1077.8 [I.V.:527.8; IV Piggyback:550] Out: 2150 [Urine:2150] Intake/Output from this shift:    Physical Exam: NECK: Without JVD, HJR, or bruit LUNGS: Clear anterior, posterior, lateral HEART: Irregular rate and rhythm, 2/6 systolic murmur LSB, no gallop, rub, bruit, thrill, or heave EXTREMITIES: Without cyanosis, clubbing, or edema   Lab Results:  Recent Labs  05/20/14 0440 05/21/14 0500  WBC 11.3* 12.8*  HGB 12.1* 12.5*  PLT 158 173    Recent Labs  05/21/14 0500 05/22/14 0500  NA 129* 134*  K 4.4 4.0  CL 92* 96  CO2 20 24  GLUCOSE 172* 123*  BUN 26* 22  CREATININE 1.66* 1.49*    Recent Labs  05/19/14 1258 05/19/14 1915  TROPONINI <0.30 <0.30   Hepatic Function Panel  Recent Labs  05/22/14 0500  PROT 6.7  ALBUMIN 2.6*  AST 48*  ALT 31  ALKPHOS 55  BILITOT 1.1   No results found for this basename: CHOL,  in the last 72 hours No results found for this basename: PROTIME,  in the last 72 hours  Imaging:   Cardiac Studies: Telemetry: Atrial flutter at 80/m   Assessment/Plan:  1. Chest Pain: MI ruled out with negative cardiac enzymes are found be negative ruling out ACS. Most recent cardiac catheterization 07/2013 revealed ischemic cardiomyopathy with a widely patent proximal LAD stent and moderate segmental calcified proximal LAD stenosis. His first diagonal was totally occluded and diffusely diseased. Does not appear to be cardiac in  etiology, continue current medical therapy.   2. ICM:  EF of 15% Echo 02/2014. He is followed by a Dr. Andree Elk in [facility] for CHF management. Lasix is currently on hold.  3. St. Jude ICD pacemaker in situ: Pacemaker was interrogated in our office 3 days ago, and this revealed predominant rhythm as atrial flutter, right atrium was paced 12% of the time right ventricle is a 78% of the time. The patient is currently receiving amiodarone IV bolus. There is no evidence of ventricular tachycardia.  4. Atrial flutter: with RBBB/aberrancy.Heart rate is well-controlled currently.Can switch to oral Amiodarone. He has been changed from Coumadin to a Eliquis per Dr. Andree Elk approximately one month ago  5. Rectal carcinoma: He has received chemotherapy and currently has a colostomy.Followup per oncology and primary care LOS: 3 days      Ermalinda Barrios 05/22/2014, 9:22 AM   Patient seen and discussed with PA Bonnell Public, agree with her documentation above. Wide complex tachycardia yesterday to 110s that was asymptomatic with stable blood pressures, initial evaluation given his history of cardiomyopathy and tele findings concerning for slow VT. Started on amio drip, follow up EKG and telemetry reviews actually showed his native QRS is a wide RBBB identical to the wide complex from yesterday and the rhythm was actually aflutter with a rapid response and his native QRS complex, he is sometimes intermittently paced which produces a more narrow complex. Amio was continued for aflutter overnight,  EKG this morning shows aflutter with variable conduction and intermittent pacing with rates in 80s. Will stop amio drip, resume his oral dosing. Soft blood pressures, will hold his oral nitrate today. Based on his improving low sodium and Cr he likely is dry from his recent abdominal issues, continue to hold diuretic. Continue beta blocker. Would monitor at least one more day hemodynamically.   Carlyle Dolly MD

## 2014-05-22 NOTE — Progress Notes (Signed)
ANTIBIOTIC CONSULT NOTE - INITIAL  Pharmacy Consult for Vancomycin & Zosyn Indication: cellulitis  No Known Allergies  Patient Measurements: Height: 5\' 10"  (177.8 cm) Weight: 220 lb 7.4 oz (100 kg) IBW/kg (Calculated) : 73  Vital Signs: Temp: 98 F (36.7 C) (06/17 0800) Temp src: Oral (06/17 0800) BP: 89/69 mmHg (06/17 1300) Pulse Rate: 99 (06/17 0500) Intake/Output from previous day: 06/16 0701 - 06/17 0700 In: 1077.8 [I.V.:527.8; IV Piggyback:550] Out: 2150 [Urine:2150] Intake/Output from this shift: Total I/O In: -  Out: 975 [Urine:975]  Labs:  Recent Labs  05/20/14 0440 05/21/14 0500 05/22/14 0500  WBC 11.3* 12.8*  --   HGB 12.1* 12.5*  --   PLT 158 173  --   CREATININE 1.67* 1.66* 1.49*   Estimated Creatinine Clearance: 63.3 ml/min (by C-G formula based on Cr of 1.49). No results found for this basename: VANCOTROUGH, Corlis Leak, VANCORANDOM, [place], GENTPEAK, GENTRANDOM, TOBRATROUGH, TOBRAPEAK, TOBRARND, AMIKACINPEAK, AMIKACINTROU, AMIKACIN,  in the last 72 hours   Microbiology: Recent Results (from the past 720 hour(s))  MRSA PCR SCREENING     Status: None   Collection Time    05/21/14  9:15 AM      Result Value Ref Range Status   MRSA by PCR NEGATIVE  NEGATIVE Final   Comment:            The GeneXpert MRSA Assay (FDA     approved for NASAL specimens     only), is one component of a     comprehensive MRSA colonization     surveillance program. It is not     intended to diagnose MRSA     infection nor to guide or     monitor treatment for     MRSA infections.    Medical History: Past Medical History  Diagnosis Date  . Essential hypertension, benign   . Type 2 diabetes mellitus   . Coronary atherosclerosis of native coronary artery     BMS to LAD 2001 at [facility], PTCA/atherectomy ramus and BMS to LAD 2009  . S/P colonoscopy     Normal via ostomy - September 2009  . Chronic systolic heart failure   . Myocardial infarction, anterior wall    Treated with tPA at [facility] 2000  . GERD (gastroesophageal reflux disease)   . Cardiomyopathy, ischemic     BIV ICD St. Jude, LVEF 23%  . Paroxysmal atrial fibrillation     Amiodarone and Coumadin  . 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 Prostate cancer     s/p seed implants with chemo and radiation  . 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 Transaminitis 12/12/2013  . CHF (congestive heart failure)   . TIA (transient ischemic attack)     Medications:  Scheduled:  . amiodarone  200 mg Oral Daily  . apixaban  5 mg Oral BID  . aspirin EC  81 mg Oral QHS  . citalopram  20 mg Oral Daily  . digoxin  0.125 mg Oral QODAY  . insulin aspart  12 Units Subcutaneous Q lunch  . insulin aspart  14 Units Subcutaneous BID WC  . insulin glargine  24 Units Subcutaneous QHS  . metoprolol succinate  50 mg Oral Daily  . niacin  1,000 mg Oral QHS  . pantoprazole  40 mg Oral Daily  . ranolazine  1,000 mg Oral Daily  . sorbitol  30 mL Oral BID   Assessment: 58 yo F admitted with chest pain on  6/14 was noted to have erythema & swelling of RUE yesterday.  He was started on Keflex.  MD broadening coverage today to Vancomycin & Zosyn.   He is afebrile.  WBC is increased slightly today.   Renal function was elevated on admission, but is improving.   Vancomycin 6/17>> Zosyn 6/17>> Keflex 6/16>>6/17  Goal of Therapy:  Vancomycin trough level 10-15 mcg/ml  Plan:  Zosyn 3.375gm IV Q8h to be infused over 4hrs Vancomycin 1gm IV q12h Check Vancomycin trough at steady state Monitor renal function and cx data   Biagio Borg 05/22/2014,2:27 PM

## 2014-05-23 ENCOUNTER — Ambulatory Visit: Payer: Medicare Other | Admitting: Cardiology

## 2014-05-23 DIAGNOSIS — E785 Hyperlipidemia, unspecified: Secondary | ICD-10-CM

## 2014-05-23 DIAGNOSIS — I10 Essential (primary) hypertension: Secondary | ICD-10-CM

## 2014-05-23 DIAGNOSIS — I5022 Chronic systolic (congestive) heart failure: Secondary | ICD-10-CM

## 2014-05-23 LAB — CBC
HCT: 36 % — ABNORMAL LOW (ref 39.0–52.0)
Hemoglobin: 12.2 g/dL — ABNORMAL LOW (ref 13.0–17.0)
MCH: 28.6 pg (ref 26.0–34.0)
MCHC: 33.9 g/dL (ref 30.0–36.0)
MCV: 84.3 fL (ref 78.0–100.0)
PLATELETS: 161 10*3/uL (ref 150–400)
RBC: 4.27 MIL/uL (ref 4.22–5.81)
RDW: 17.8 % — AB (ref 11.5–15.5)
WBC: 9.3 10*3/uL (ref 4.0–10.5)

## 2014-05-23 LAB — GLUCOSE, CAPILLARY: Glucose-Capillary: 122 mg/dL — ABNORMAL HIGH (ref 70–99)

## 2014-05-23 LAB — BASIC METABOLIC PANEL
BUN: 19 mg/dL (ref 6–23)
CO2: 22 mEq/L (ref 19–32)
Calcium: 8.6 mg/dL (ref 8.4–10.5)
Chloride: 98 mEq/L (ref 96–112)
Creatinine, Ser: 1.33 mg/dL (ref 0.50–1.35)
GFR, EST AFRICAN AMERICAN: 66 mL/min — AB (ref >90)
GFR, EST NON AFRICAN AMERICAN: 57 mL/min — AB (ref >90)
Glucose, Bld: 116 mg/dL — ABNORMAL HIGH (ref 70–99)
POTASSIUM: 3.8 meq/L (ref 3.7–5.3)
Sodium: 134 mEq/L — ABNORMAL LOW (ref 137–147)

## 2014-05-23 MED ORDER — METOPROLOL SUCCINATE ER 50 MG PO TB24: 50.0000 mg | ORAL_TABLET | Freq: Every day | ORAL | Status: DC

## 2014-05-23 MED ORDER — CLINDAMYCIN HCL 300 MG PO CAPS: 300.0000 mg | ORAL_CAPSULE | Freq: Three times a day (TID) | ORAL | Status: DC

## 2014-05-23 MED ORDER — AMIODARONE HCL 200 MG PO TABS: 200.0000 mg | ORAL_TABLET | Freq: Every day | ORAL | Status: DC

## 2014-05-23 MED ORDER — FUROSEMIDE 20 MG PO TABS: 20.0000 mg | ORAL_TABLET | Freq: Every day | ORAL | Status: DC

## 2014-05-23 NOTE — Discharge Summary (Signed)
Physician Discharge Summary  [name] DZH:[id number] DOB: 1956/10/09 DOA: 05/19/2014  PCP: Glo Herring., MD  Admit date: 05/19/2014 Discharge date: 05/23/2014  Time spent: 45 minutes  Recommendations for Outpatient Follow-up:  Patient will be discharged to home. He is to follow up with Dr. Gerarda Fraction for the moment of discharge. Patient also needs follow up with gastroenterology for outpatient EGD and/or colonoscopy. Patient should also follow up with cardiology in 2 weeks.  Patient should continue taking his medications as prescribed. He should refrain from being in the sun. Patient should follow a heart healthy diet.    Discharge Diagnoses:  Chest pain/ coronary artery disease Ischemic cardiomyopathy Atrial flutter with right bundle branch block aberrancy/questionable VT History of ductal carcinoma Type 2 diabetes mellitus Depression Chronic kidney disease, stage II Hyponatremia Constipation Right upper extremity cellulitis  Discharge Condition: Stable  Diet recommendation: Heart healthy with 1550ml fluid restriction per day  Filed Weights   05/19/14 0028 05/19/14 0658 05/22/14 0500  Weight: 99.338 kg (219 lb) 102.331 kg (225 lb 9.6 oz) 100 kg (220 lb 7.4 oz)    History of present illness:  58 yo male with history of CAD, systolic CHF with EF about 20%, HTN, AICD presented with shortness of breath. No cough. No fevers. No swelling. No pnd or orthopnea. No chest pain. Does not require oxygen at home. With his previous heart attacks he had chest pressure and shortness of breath. His shortness of breath had resolved upon arrival. Asked to obs for concern of anginal equivalent.   Hospital Course:  Chest pain/ coronary artery disease  -ACS ruled out, and troponins remained negative  -Cardiac catheterization in August of 2014 revealed ischemic cardiomyopathy with a widely patent proximal LAD stent and moderate segment calcified proximal LAD stenosis  -Cardiology consulted and  recommended continued medical therapy  -Continue Ranolazine and aspirin   Ischemic cardiomyopathy  -EF of 15% on echocardiogram in March 2015  -Patient sees Dr. Andree Elk in [place] for CHF management  -Per cardiology recommendations: Holding enalapril and isordil and spironolactone due to soft blood pressures -Patient will need to speak with cardiology regarding this at followup in 2 weeks  Atrial flutter with right bundle branch block aberrancy/questionable VT  -Cardiology following and appreciated  -Patient was placed on an amiodarone drip  -Cardiology has this patient over to amiodarone by mouth  -Cardiology recommended holding diuretics due to his creatinine, as well as nitrates due to his soft blood pressures however to continue beta blocker  -Will continue digoxin, metoprolol -Continue Eliquis for anticoagulation   History rectal carcinoma  -Status post colectomy and ostomy, patient received chemotherapy as well as radiation  -Continue outpatient followup   Type 2 diabetes mellitus  -Continue Lantus 24 units as well as insulin sliding scale and CBG monitoring  -Last hemoglobin A1c in March 2015: 10.5  -Resume metformin  Depression  -Continue Celexa   Chronic kidney disease, stage II  -At baseline, will continue to monitor   Hyponatremia  -Improving, Na 134   Constipation  -Patient had been constipated for approximately 6 days before receiving contrast for CT scan, which caused him to pass large amount of stools.  -Patient has been counseled on his eating habits concerning he eats a lot of popcorn.  -GI has been consulted and recommended outpatient EGD for esophageal thickening as well as colonoscopy.   Right upper extremity cellulitis  -Initially started on keflex and switched to IV vancomycin and zosyn  -Improved -Will discharge with clindamycin -Patient  is to follow up with his primary care  physician  Procedures: None  Consultations: Cardiology Gastroenterology  Discharge Exam: Filed Vitals:   05/23/14 0410  BP:   Pulse:   Temp: 98.5 F (36.9 C)  Resp:    Exam  General: Well developed, well nourished, NAD, appears stated age  HEENT: NCAT, mucous membranes moist.  Neck: Supple, no JVD, no masses  Cardiovascular: S1 S2 auscultated, 2/6 SEM, irregularly irregular  Respiratory: Clear to auscultation bilaterally with equal chest rise  Abdomen: Soft, nontender, nondistended, + bowel sounds, colostomy bag in place  Extremities: warm dry without cyanosis clubbing or edema in LE B/L, RUE erythema and swelling- improving Neuro: AAOx3, No focal deficits Skin: Erythema and swelling of the right upper extremity  Psych: Normal affect and demeanor   Discharge Instructions      Discharge Instructions   Diet - low sodium heart healthy    Complete by:  As directed   1549ml fluid restriction per day     Discharge instructions    Complete by:  As directed   Patient will be discharged to home. He is to follow up with Dr. Gerarda Fraction for the moment of discharge. Patient also needs follow up with gastroenterology for outpatient EGD and/or colonoscopy. Patient should also follow up with cardiology in 2 weeks.  Patient should continue taking his medications as prescribed. He should refrain from being in the sun. Patient should follow a heart healthy diet.     Increase activity slowly    Complete by:  As directed             Medication List    STOP taking these medications       enalapril 10 MG tablet  Commonly known as:  VASOTEC     isosorbide dinitrate 30 MG tablet  Commonly known as:  ISORDIL     spironolactone 25 MG tablet  Commonly known as:  ALDACTONE      TAKE these medications       amiodarone 200 MG tablet  Commonly known as:  PACERONE  Take 1 tablet (200 mg total) by mouth daily.     aspirin 81 MG EC tablet  Take 81 mg by mouth at bedtime.     citalopram  20 MG tablet  Commonly known as:  CELEXA  Take 1 tablet (20 mg total) by mouth daily.     clindamycin 300 MG capsule  Commonly known as:  CLEOCIN  Take 1 capsule (300 mg total) by mouth 3 (three) times daily.     co-enzyme Q-10 50 MG capsule  Take 50 mg by mouth every morning.     digoxin 0.125 MG tablet  Commonly known as:  LANOXIN  Take 1 tablet (0.125 mg total) by mouth every other day.     ELIQUIS 5 MG Tabs tablet  Generic drug:  apixaban  Take 5 mg by mouth 2 (two) times daily.     Fish Oil 1200 MG Caps  Take 1,200 mg by mouth 2 (two) times daily.     fluticasone 50 MCG/ACT nasal spray  Commonly known as:  FLONASE  Place 1 spray into both nostrils daily as needed for allergies.     furosemide 20 MG tablet  Commonly known as:  LASIX  Take 1 tablet (20 mg total) by mouth daily.  Start taking on:  05/25/2014     insulin aspart 100 UNIT/ML injection  Commonly known as:  novoLOG  - Inject 12-14 Units into the skin  3 (three) times daily as needed for high blood sugar. 14 units at breakfast and 12 units a lunch and 12 units at supper per sliding scale  -      insulin glargine 100 UNIT/ML injection  Commonly known as:  LANTUS  Inject 24 Units into the skin at bedtime.     meclizine 25 MG tablet  Commonly known as:  ANTIVERT  Take 1 tablet (25 mg total) by mouth 3 (three) times daily as needed for dizziness.     metFORMIN 500 MG tablet  Commonly known as:  GLUCOPHAGE  Take 500 mg by mouth 2 (two) times daily with a meal.     metoprolol succinate 50 MG 24 hr tablet  Commonly known as:  TOPROL-XL  Take 1 tablet (50 mg total) by mouth daily. Take with or immediately following a meal.     niacin 1000 MG CR tablet  Commonly known as:  NIASPAN  Take 1,000 mg by mouth at bedtime.     nitroGLYCERIN 0.4 MG/SPRAY spray  Commonly known as:  NITROLINGUAL  Place 1 spray under the tongue every 5 (five) minutes as needed. angina     pantoprazole 40 MG tablet  Commonly known  as:  PROTONIX  Take 1 tablet (40 mg total) by mouth daily.     ranolazine 1000 MG SR tablet  Commonly known as:  RANEXA  Take 1 tablet (1,000 mg total) by mouth 2 (two) times daily.     rosuvastatin 40 MG tablet  Commonly known as:  CRESTOR  Take 40 mg by mouth at bedtime.       No Known Allergies Follow-up Information   Follow up with Glo Herring., MD. Schedule an appointment as soon as possible for a visit in 1 week. [facility] followup)    Specialty:  Internal Medicine   Contact information:   [address] [place] [address] [phone]       Follow up with Jory Sims, NP. Schedule an appointment as soon as possible for a visit in 2 weeks. [facility] followup)    Specialty:  Nurse Practitioner   Contact information:   [place] [address] [phone]       Follow up with Barney Drain, MD. Schedule an appointment as soon as possible for a visit in 2 weeks. [facility] followup)    Specialty:  Gastroenterology   Contact information:   [place] [address] [place] [address] [phone]        The results of significant diagnostics from this hospitalization (including imaging, microbiology, ancillary and laboratory) are listed below for reference.    Significant Diagnostic Studies: Ct Abdomen Pelvis Wo Contrast  05/20/2014   CLINICAL DATA:  Short of breath.  Dizziness.  Nausea and vomiting.  EXAM: CT ABDOMEN AND PELVIS WITHOUT CONTRAST  TECHNIQUE: Multidetector CT imaging of the abdomen and pelvis was performed following the standard protocol without IV contrast.  COMPARISON:  CT 05/26/2010.  Radiographs 05/19/2014.  FINDINGS: Bones: No aggressive osseous lesions. Moderate thoracolumbar spondylosis. Calcified disc protrusion at L5-S1.  Lung Bases: Dependent atelectasis. Scarring in the right middle lobe and lingula. Partially visualized pacemaker leads in the heart.  Liver: Unenhanced CT was performed  per clinician order. Lack of IV contrast limits sensitivity and specificity, especially for evaluation of abdominal/pelvic solid viscera. Normal.  Spleen:  Normal.  Gallbladder: Contracted with cholelithiasis. No inflammatory changes by CT.  Common bile duct: No dilation or calcified common duct stone  identified.  Pancreas:  Normal.  Adrenal glands:  Normal bilaterally.  Kidneys:  No calculi.  Both ureters appear within normal limits.  Stomach: Mild thickening of the distal esophagus, suggesting gastroesophageal reflux. The stomach is collapsed. No inflammatory changes.  Small bowel: Duodenum appears normal. There is no bowel dilation. No evidence of obstruction, which was suggested on the prior radiograph series. Normal opacification of small bowel. No mesenteric adenopathy.  Colon: Normal appendix. Colon is moderately distended with stool. Left lower quadrant end colostomy. Resection of the rectosigmoid with surgical clips and unchanged soft tissue in the presacral region.  Pelvic Genitourinary: Posterior traction of the urinary bladder, likely associated with scar tissue with an elongated appearance. No inflammatory changes.  Vasculature: Atherosclerosis.  Body Wall: Scarring in the midline of the abdominal wall. Tiny fat containing peristomal hernia.  IMPRESSION: 1. Negative for small bowel obstruction. 2. Cholelithiasis.  No CT evidence of cholecystitis. 3. Abdominoperineal resection. Uncomplicated left lower quadrant end colostomy. Moderate stool burden. 4. Mild thickening of the distal esophagus which can be associated with gastroesophageal reflux.   Electronically Signed   By: Dereck Ligas M.D.   On: 05/20/2014 19:18   Dg Chest 2 View  05/19/2014   CLINICAL DATA:  Shortness of breath.  EXAM: CHEST  2 VIEW  COMPARISON:  Chest radiograph performed 02/28/2014  FINDINGS: The lungs are well-aerated. Mild bibasilar opacities may reflect atelectasis or possibly mild pneumonia. There is no evidence of focal  opacification, pleural effusion or pneumothorax.  The heart is borderline normal in size. A pacemaker/AICD is noted at the left chest wall, with leads ending at the right atrium, right ventricle and coronary sinus. No acute osseous abnormalities are seen.  IMPRESSION: Mild bibasilar airspace opacities may reflect atelectasis or possibly pneumonia.   Electronically Signed   By: Garald Balding M.D.   On: 05/19/2014 03:13   Dg Abd Acute W/chest  05/19/2014   CLINICAL DATA:  Abdominal discomfort.  Constipated.  EXAM: ACUTE ABDOMEN SERIES (ABDOMEN 2 VIEW & CHEST 1 VIEW)  COMPARISON:  Chest x-ray 05/19/2014  FINDINGS: Left AICD remains in place, unchanged. Cardiomegaly. No confluent opacities, effusions or edema.  Large stool burden throughout the colon. Mildly prominent left abdominal small bowel loops with scattered air-fluid levels. No free air organomegaly. No suspicious calcification. Degenerative changes in the lumbar spine and hips.  IMPRESSION: Large stool burden.  Mildly prominent left abdominal small bowel loops. While this may reflect focal ileus, I cannot exclude early small bowel obstruction.  Cardiomegaly.   Electronically Signed   By: Rolm Baptise M.D.   On: 05/19/2014 11:54    Microbiology: Recent Results (from the past 240 hour(s))  MRSA PCR SCREENING     Status: None   Collection Time    05/21/14  9:15 AM      Result Value Ref Range Status   MRSA by PCR NEGATIVE  NEGATIVE Final   Comment:            The GeneXpert MRSA Assay (FDA     approved for NASAL specimens     only), is one component of a     comprehensive MRSA colonization     surveillance program. It is not     intended to diagnose MRSA     infection nor to guide or     monitor treatment for     MRSA infections.     Labs: Basic Metabolic Panel:  Recent Labs Lab 05/19/14 0203 05/20/14 0440 05/21/14 0456 05/21/14  0500 05/22/14 0500 05/23/14 0550  NA 132* 132*  --  129* 134* 134*  K 4.7 4.5  --  4.4 4.0 3.8  CL  94* 92*  --  92* 96 98  CO2 22 25  --  20 24 22   GLUCOSE 347* 181*  --  172* 123* 116*  BUN 30* 26*  --  26* 22 19  CREATININE 1.70* 1.67*  --  1.66* 1.49* 1.33  CALCIUM 9.0 9.1  --  9.1 8.7 8.6  MG  --   --  1.8  --  2.4  --    Liver Function Tests:  Recent Labs Lab 05/20/14 0440 05/21/14 0500 05/22/14 0500  AST 34 32 48*  ALT 28 26 31   ALKPHOS 51 52 55  BILITOT 1.9* 1.7* 1.1  PROT 7.2 7.1 6.7  ALBUMIN 2.9* 3.0* 2.6*   No results found for this basename: LIPASE, AMYLASE,  in the last 168 hours No results found for this basename: AMMONIA,  in the last 168 hours CBC:  Recent Labs Lab 05/19/14 0203 05/20/14 0440 05/21/14 0500 05/23/14 0550  WBC 11.1* 11.3* 12.8* 9.3  NEUTROABS 9.7* 8.8* 10.5*  --   HGB 12.6* 12.1* 12.5* 12.2*  HCT 37.2* 35.9* 36.8* 36.0*  MCV 84.4 84.3 83.4 84.3  PLT 150 158 173 161   Cardiac Enzymes:  Recent Labs Lab 05/19/14 0203 05/19/14 0710 05/19/14 1258 05/19/14 1915 05/21/14 0456  CKTOTAL  --   --   --   --  39  CKMB  --   --   --   --  <1.0  TROPONINI <0.30 <0.30 <0.30 <0.30  --    BNP: BNP (last 3 results)  Recent Labs  01/26/14 2128 02/28/14 2309 05/19/14 0203  PROBNP 2348.0* 1079.0* 2631.0*   CBG:  Recent Labs Lab 05/22/14 0743 05/22/14 1116 05/22/14 1719 05/22/14 2142 05/23/14 0812  GLUCAP 94 144* 115* 105* 122*       Signed:  MIKHAIL, MARYANN  [facility] 05/23/2014, 9:57 AM

## 2014-05-23 NOTE — Discharge Instructions (Signed)
Cellulitis Cellulitis is an infection of the skin and the tissue beneath it. The infected area is usually red and tender. Cellulitis occurs most often in the arms and lower legs.  CAUSES  Cellulitis is caused by bacteria that enter the skin through cracks or cuts in the skin. The most common types of bacteria that cause cellulitis are Staphylococcus and Streptococcus. SYMPTOMS   Redness and warmth.  Swelling.  Tenderness or pain.  Fever. DIAGNOSIS  Your caregiver can usually determine what is wrong based on a physical exam. Blood tests may also be done. TREATMENT  Treatment usually involves taking an antibiotic medicine. HOME CARE INSTRUCTIONS   Take your antibiotics as directed. Finish them even if you start to feel better.  Keep the infected arm or leg elevated to reduce swelling.  Apply a warm cloth to the affected area up to 4 times per day to relieve pain.  Only take over-the-counter or prescription medicines for pain, discomfort, or fever as directed by your caregiver.  Keep all follow-up appointments as directed by your caregiver. SEEK MEDICAL CARE IF:   You notice red streaks coming from the infected area.  Your red area gets larger or turns dark in color.  Your bone or joint underneath the infected area becomes painful after the skin has healed.  Your infection returns in the same area or another area.  You notice a swollen bump in the infected area.  You develop new symptoms. SEEK IMMEDIATE MEDICAL CARE IF:   You have a fever.  You feel very sleepy.  You develop vomiting or diarrhea.  You have a general ill feeling (malaise) with muscle aches and pains. MAKE SURE YOU:   Understand these instructions.  Will watch your condition.  Will get help right away if you are not doing well or get worse. Document Released: 09/01/2005 Document Revised: 05/23/2012 Document Reviewed: 02/07/2012 [facility] Patient Information 2015 [place], [place]. This information is  not intended to replace advice given to you by your health care provider. Make sure you discuss any questions you have with your health care provider.  Heart Failure Heart failure is a condition in which the heart has trouble pumping blood. This means your heart does not pump blood efficiently for your body to work well. In some cases of heart failure, fluid may back up into your lungs or you may have swelling (edema) in your lower legs. Heart failure is usually a long-term (chronic) condition. It is important for you to take good care of yourself and follow your caregiver's treatment plan. CAUSES  Some health conditions can cause heart failure. Those health conditions include:  High blood pressure (hypertension) causes the heart muscle to work harder than normal. When pressure in the blood vessels is high, the heart needs to pump (contract) with more force in order to circulate blood throughout the body. High blood pressure eventually causes the heart to become stiff and weak.  Coronary artery disease (CAD) is the buildup of cholesterol and fat (plaque) in the arteries of the heart. The blockage in the arteries deprives the heart muscle of oxygen and blood. This can cause chest pain and may lead to a heart attack. High blood pressure can also contribute to CAD.  Heart attack (myocardial infarction) occurs when 1 or more arteries in the heart become blocked. The loss of oxygen damages the muscle tissue of the heart. When this happens, part of the heart muscle dies. The injured tissue does not contract as well and weakens the  heart's ability to pump blood.  Abnormal heart valves can cause heart failure when the heart valves do not open and close properly. This makes the heart muscle pump harder to keep the blood flowing.  Heart muscle disease (cardiomyopathy or myocarditis) is damage to the heart muscle from a variety of causes. These can include drug or alcohol abuse, infections, or unknown reasons.  These can increase the risk of heart failure.  Lung disease makes the heart work harder because the lungs do not work properly. This can cause a strain on the heart, leading it to fail.  Diabetes increases the risk of heart failure. High blood sugar contributes to high fat (lipid) levels in the blood. Diabetes can also cause slow damage to tiny blood vessels that carry important nutrients to the heart muscle. When the heart does not get enough oxygen and food, it can cause the heart to become weak and stiff. This leads to a heart that does not contract efficiently.  Other conditions can contribute to heart failure. These include abnormal heart rhythms, thyroid problems, and low blood counts (anemia). Certain unhealthy behaviors can increase the risk of heart failure. Those unhealthy behaviors include:  Being overweight.  Smoking or chewing tobacco.  Eating foods high in fat and cholesterol.  Abusing illicit drugs or alcohol.  Lacking physical activity. SYMPTOMS  Heart failure symptoms may vary and can be hard to detect. Symptoms may include:  Shortness of breath with activity, such as climbing stairs.  Persistent cough.  Swelling of the feet, ankles, legs, or abdomen.  Unexplained weight gain.  Difficulty breathing when lying flat (orthopnea).  Waking from sleep because of the need to sit up and get more air.  Rapid heartbeat.  Fatigue and loss of energy.  Feeling lightheaded, dizzy, or close to fainting.  Loss of appetite.  Nausea.  Increased urination during the night (nocturia). DIAGNOSIS  A diagnosis of heart failure is based on your history, symptoms, physical examination, and diagnostic tests. Diagnostic tests for heart failure may include:  Echocardiography.  Electrocardiography.  Chest X-ray.  Blood tests.  Exercise stress test.  Cardiac angiography.  Radionuclide scans. TREATMENT  Treatment is aimed at managing the symptoms of heart failure.  Medicines, behavioral changes, or surgical intervention may be necessary to treat heart failure.  Medicines to help treat heart failure may include:  Angiotensin-converting enzyme (ACE) inhibitors. This type of medicine blocks the effects of a blood protein called angiotensin-converting enzyme. ACE inhibitors relax (dilate) the blood vessels and help lower blood pressure.  Angiotensin receptor blockers. This type of medicine blocks the actions of a blood protein called angiotensin. Angiotensin receptor blockers dilate the blood vessels and help lower blood pressure.  Water pills (diuretics). Diuretics cause the kidneys to remove salt and water from the blood. The extra fluid is removed through urination. This loss of extra fluid lowers the volume of blood the heart pumps.  Beta blockers. These prevent the heart from beating too fast and improve heart muscle strength.  Digitalis. This increases the force of the heartbeat.  Healthy behavior changes include:  Obtaining and maintaining a healthy weight.  Stopping smoking or chewing tobacco.  Eating heart healthy foods.  Limiting or avoiding alcohol.  Stopping illicit drug use.  Physical activity as directed by your caregiver.  Surgical treatment for heart failure may include:  A procedure to open blocked arteries, repair damaged heart valves, or remove damaged heart muscle tissue.  A pacemaker to improve heart muscle function and control certain  abnormal heart rhythms.  An internal cardioverter defibrillator to treat certain serious abnormal heart rhythms.  A left ventricular assist device to assist the pumping ability of the heart. HOME CARE INSTRUCTIONS   Take your medicine as directed by your caregiver. Medicines are important in reducing the workload of your heart, slowing the progression of heart failure, and improving your symptoms.  Do not stop taking your medicine unless directed by your caregiver.  Do not skip any dose  of medicine.  Refill your prescriptions before you run out of medicine. Your medicines are needed every day.  Take over-the-counter medicine only as directed by your caregiver or pharmacist.  Engage in moderate physical activity if directed by your caregiver. Moderate physical activity can benefit some people. The elderly and people with severe heart failure should consult with a caregiver for physical activity recommendations.  Eat heart healthy foods. Food choices should be free of trans fat and low in saturated fat, cholesterol, and salt (sodium). Healthy choices include fresh or frozen fruits and vegetables, fish, lean meats, legumes, fat-free or low-fat dairy products, and whole grain or high fiber foods. Talk to a dietitian to learn more about heart healthy foods.  Limit sodium if directed by your caregiver. Sodium restriction may reduce symptoms of heart failure in some people. Talk to a dietitian to learn more about heart healthy seasonings.  Use healthy cooking methods. Healthy cooking methods include roasting, grilling, broiling, baking, poaching, steaming, or stir-frying. Talk to a dietitian to learn more about healthy cooking methods.  Limit fluids if directed by your caregiver. Fluid restriction may reduce symptoms of heart failure in some people.  Weigh yourself every day. Daily weights are important in the early recognition of excess fluid. You should weigh yourself every morning after you urinate and before you eat breakfast. Wear the same amount of clothing each time you weigh yourself. Record your daily weight. Provide your caregiver with your weight record.  Monitor and record your blood pressure if directed by your caregiver.  Check your pulse if directed by your caregiver.  Lose weight if directed by your caregiver. Weight loss may reduce symptoms of heart failure in some people.  Stop smoking or chewing tobacco. Nicotine makes your heart work harder by causing your blood  vessels to constrict. Do not use nicotine gum or patches before talking to your caregiver.  Schedule and attend follow-up visits as directed by your caregiver. It is important to keep all your appointments.  Limit alcohol intake to no more than 1 drink per day for nonpregnant women and 2 drinks per day for men. Drinking more than that is harmful to your heart. Tell your caregiver if you drink alcohol several times a week. Talk with your caregiver about whether alcohol is safe for you. If your heart has already been damaged by alcohol or you have severe heart failure, drinking alcohol should be stopped completely.  Stop illicit drug use.  Stay up-to-date with immunizations. It is especially important to prevent respiratory infections through current pneumococcal and influenza immunizations.  Manage other health conditions such as hypertension, diabetes, thyroid disease, or abnormal heart rhythms as directed by your caregiver.  Learn to manage stress.  Plan rest periods when fatigued.  Learn strategies to manage high temperatures. If the weather is extremely hot:  Avoid vigorous physical activity.  Use air conditioning or fans or seek a cooler location.  Avoid caffeine and alcohol.  Wear loose-fitting, lightweight, and light-colored clothing.  Learn strategies to manage cold  temperatures. If the weather is extremely cold:  Avoid vigorous physical activity.  Layer clothes.  Wear mittens or gloves, a hat, and a scarf when going outside.  Avoid alcohol.  Obtain ongoing education and support as needed.  Participate or seek rehabilitation as needed to maintain or improve independence and quality of life. SEEK MEDICAL CARE IF:   Your weight increases by 03 lb/1.4 kg in 1 day or 05 lb/2.3 kg in a week.  You have increasing shortness of breath that is unusual for you.  You are unable to participate in your usual physical activities.  You tire easily.  You cough more than  normal, especially with physical activity.  You have any or more swelling in areas such as your hands, feet, ankles, or abdomen.  You are unable to sleep because it is hard to breathe.  You feel like your heart is beating fast (palpitations).  You become dizzy or lightheaded upon standing up. SEEK IMMEDIATE MEDICAL CARE IF:   You have difficulty breathing.  There is a change in mental status such as decreased alertness or difficulty with concentration.  You have a pain or discomfort in your chest.  You have an episode of fainting (syncope). MAKE SURE YOU:   Understand these instructions.  Will watch your condition.  Will get help right away if you are not doing well or get worse. Document Released: 11/22/2005 Document Revised: 03/19/2013 Document Reviewed: 12/14/2012 [facility] Patient Information 2015 [place], [place]. This information is not intended to replace advice given to you by your health care provider. Make sure you discuss any questions you have with your health care provider.  Coronary Artery Disease Coronary artery disease (CAD) is a process in which the heart (coronary) arteries narrow or become blocked from the development of atherosclerosis. Atherosclerosis is a disease in which plaque builds up on the inside of the heart arteries (coronary arteries). Plaque is made up of fats (lipids), cholesterol, calcium, and fibrous tissue. CAD can lead to a heart attack (myocardial infarction, MI). An MI can lead to heart failure, cardiogenic shock, or sudden cardiac death. CAD can cause an MI through:  Plaque buildup that can severely narrow or block the coronary arteries and diminish blood flow.  Plaque that can become unstable and "rupture." Unstable plaque that ruptures within a coronary artery can form a clot and cause a sudden (acute) blockage. RISK FACTORS Many risk factors contribute to the development of CAD. These include:  High cholesterol (dyslipidemia)  levels.  High blood pressure (hypertension).  Smoking.  Diabetes.  Age.  Gender. Men can develop CAD earlier in life than women.  Family history.  Inactivity or lack of regular physical or aerobic exercise.  A diet high in saturated fats.  Chronic kidney disease. SYMPTOMS  When a coronary artery is narrowed or blocked, an MI can occur. MI symptoms can include:  Chest pain (agina). Angina can occur by itself or it can also occur with pain in the neck, arm, jaw, or in the upper, middle back (mid-scapular pain).  Profuse sweating (diaphoresis) without physical activity or movement.  Shortness of breath (dyspnea).  Irregular heartbeats (palpitations) that feel like your heart is skipping beats or is beating very fast.  Nausea.  Epigastric pain. Epigastric pain may occur as "heartburn."  Tiredness (malaise). This can especially be present in the elderly. Women can have different (atypical) symptoms other than classic angina.  DIAGNOSIS  The diagnosis of CAD may include:  An electrocardiography (ECG). An ECG does not  diagnose CAD, but it is usefull in the detection of a sudden (acute) MI or as a marker for a previous MI. Depending on which heart (coronary) artery may be blocked, an ECG may not pick up an MI pattern.  Exercise stress test. A stress test can be performed at rest for people who are unable to do an exercise stress test. A stress test will only be abnormal if one or more of the large coronary arteries is significantly blocked.  Blood tests. Tests may include samples to detect heart muscle damage (such as troponin levels). Other tests may include cholesterol checks and an inflammation test (high-sensitivity C-reactive protein, hs-CRP).  Coronary angiography.  Screening people who have peripheral vascular disease (PAD). These people often times have CAD. TREATMENT  The treatment of CAD includes the following:  Lifestyle changes such as:  Following a  heart-healthy diet. A registered dietitian can you help educate you on healthy food options and changes.  Quiting smoking.  Following an exercise program approved by your caregiver.  Maintaining a healthy weight. Lose weight as approved by your caregiver.  Medicines to help control your blood pressure, cholesterol level, angina, and blood clotting. Medicines may include beta-blockers, ACE inhibitors, statins, nitrates, and anti-platelet medicines.  If you have a heart stent and are taking anti-platelet medicine, it is important to not suddenly stop taking this medicine. Suddenly stopping anti-platelet medicine can result in an MI. Talk with your caregiver before stopping medicine or if you cannot afford your medicine.  If the coronary arteries are significantly blocked, surgery may be needed. This can include:  Percutaneous coronary intervention (PCI) with or without stent placement.  Coronary artery bypass graft surgery (CABG). SEEK IMMEDIATE MEDICAL CARE IF:   You develop MI symptoms. This is a medical emergency. Get help at once. Call your local emergency service (911 in the [place] immediately. Do not drive yourself to the clinic or hospital. MI symptoms can include:  Angina or pain that occurs in the neck, arm, jaw, or in the upper middle back.  Profuse sweating without cause.  Shortness of breath or difficulty breathing without cause.  Unexplained nausea or epigastric pain that feels like heartburn. Document Released: 02/14/2012 Document Reviewed: 02/14/2012 [facility] Patient Information 2015 [place]. This information is not intended to replace advice given to you by your health care provider. Make sure you discuss any questions you have with your health care provider.

## 2014-05-23 NOTE — Telephone Encounter (Signed)
Patient was seen in hospital and Dr. Gala Romney did a consult note on 06/16 the only date in June left for RMR is Tuesday June 23 in the evening will this be sufficient for and updated H&P please advise?

## 2014-05-23 NOTE — Progress Notes (Signed)
Patient ID: [name], male   DOB: 1956-06-18, 58 y.o.   MRN: [id number]    Subjective:    No events overnight. No SOB, chest pain, or palpitations.  Objective:   Temp:  [97.3 F (36.3 C)-98.5 F (36.9 C)] 98.5 F (36.9 C) (06/18 0410) Pulse Rate:  [69] 69 (06/17 2052) Resp:  [14-22] 18 (06/17 2052) BP: (86-110)/(56-73) 110/73 mmHg (06/17 2052) SpO2:  [98 %-100 %] 98 % (06/18 0410) Last BM Date: 05/22/14  Filed Weights   05/19/14 0028 05/19/14 0658 05/22/14 0500  Weight: 219 lb (99.338 kg) 225 lb 9.6 oz (102.331 kg) 220 lb 7.4 oz (100 kg)    Intake/Output Summary (Last 24 hours) at 05/23/14 0924 Last data filed at 05/23/14 0915  Gross per 24 hour  Intake    740 ml  Output    975 ml  Net   -235 ml    Telemetry: Aflutter rate 100  Exam:  General: NAD  Resp: CTAB  Cardiac: RRR, no m/r/g, no JVD  EN:IDPOEUM soft, NT, ND  MSK:no LE edema  Neuro: no focal deficits  Psych: appropriate affect  Lab Results:  Basic Metabolic Panel:  Recent Labs Lab 05/21/14 0456 05/21/14 0500 05/22/14 0500 05/23/14 0550  NA  --  129* 134* 134*  K  --  4.4 4.0 3.8  CL  --  92* 96 98  CO2  --  20 24 22   GLUCOSE  --  172* 123* 116*  BUN  --  26* 22 19  CREATININE  --  1.66* 1.49* 1.33  CALCIUM  --  9.1 8.7 8.6  MG 1.8  --  2.4  --     Liver Function Tests:  Recent Labs Lab 05/20/14 0440 05/21/14 0500 05/22/14 0500  AST 34 32 48*  ALT 28 26 31   ALKPHOS 51 52 55  BILITOT 1.9* 1.7* 1.1  PROT 7.2 7.1 6.7  ALBUMIN 2.9* 3.0* 2.6*    CBC:  Recent Labs Lab 05/20/14 0440 05/21/14 0500 05/23/14 0550  WBC 11.3* 12.8* 9.3  HGB 12.1* 12.5* 12.2*  HCT 35.9* 36.8* 36.0*  MCV 84.3 83.4 84.3  PLT 158 173 161    Cardiac Enzymes:  Recent Labs Lab 05/19/14 0710 05/19/14 1258 05/19/14 1915 05/21/14 0456  CKTOTAL  --   --   --  39  CKMB  --   --   --  <1.0  TROPONINI <0.30 <0.30 <0.30  --     BNP:  Recent Labs  01/26/14 2128 02/28/14 2309  05/19/14 0203  PROBNP 2348.0* 1079.0* 2631.0*    Coagulation:  Recent Labs Lab 05/19/14 0204  INR 1.61*    ECG:   Medications:   Scheduled Medications: . amiodarone  200 mg Oral Daily  . apixaban  5 mg Oral BID  . aspirin EC  81 mg Oral QHS  . citalopram  20 mg Oral Daily  . digoxin  0.125 mg Oral QODAY  . insulin aspart  12 Units Subcutaneous Q lunch  . insulin aspart  14 Units Subcutaneous BID WC  . insulin glargine  24 Units Subcutaneous QHS  . metoprolol succinate  50 mg Oral Daily  . niacin  1,000 mg Oral QHS  . pantoprazole  40 mg Oral Daily  . piperacillin-tazobactam (ZOSYN)  IV  3.375 g Intravenous Q8H  . ranolazine  1,000 mg Oral Daily  . sorbitol  30 mL Oral BID  . vancomycin  1,000 mg Intravenous Q12H     Infusions:  PRN Medications:  fluticasone, meclizine, sodium chloride     Assessment/Plan    1. ICM - LVEF 15% by echo 02/2014 - soft blood pressures yesterday, his isordil and enalapril were held. BPs remain on low side, will not restart enalapril or isodril at this time, can reinitiate and retitrate as outpatient. Of note his Toprol XL has been decreased from 100mg  to 50mg  daily this admission, will continue current dose. His presentation with acute abdominal pain and hypovolemia has offset his hemodynamics somewhat, slow retitration of meds overtime on outpatient basis - would hold diuretic for additional 2 days, then resume home lasix 20mg  daily  2. Aflutter - normal rates by vitals, continue amio, Toprol, and eliquis   From cardiac standpoint no further cardiac testing or interventions planned as inpatient. He may follow up with NP Purcell Nails in 2 weeks after discharge. Will sign off of inpatient care.Call with questions.        Carlyle Dolly, M.D., F.A.C.C.

## 2014-05-23 NOTE — Progress Notes (Signed)
Patient with orders to be discharge home. Discharge instructions given, patient verbalized understanding. Patient stable. Patient left in private vehicle with friend.

## 2014-05-23 NOTE — Telephone Encounter (Signed)
In 2 weeks will be July, do you mean tentatively hold a spot in July w/RMR?

## 2014-05-23 NOTE — Telephone Encounter (Signed)
I'm sorry. Maybe have an EGD in June (very end of June if possible) and see me in clinic that same week for an updated H&P. It is important that he keep that clinic appt so we can review recent hospitalization.

## 2014-05-24 ENCOUNTER — Telehealth: Payer: Self-pay | Admitting: Neurology

## 2014-05-24 DIAGNOSIS — G4733 Obstructive sleep apnea (adult) (pediatric): Secondary | ICD-10-CM

## 2014-05-24 NOTE — Telephone Encounter (Signed)
Please call and notify patient that the recent sleep study confirmed the diagnosis of OSA. He did well with CPAP during the study with significant improvement of the respiratory events. Therefore, I would like start the patient on CPAP at home. I placed the order in the chart.   Arrange for CPAP set up at home through a DME company of patient's choice and fax/route report to PCP and referring MD (if other than PCP).   The patient will also need a follow up appointment with me in 6-8 weeks post set up that has to be scheduled; help the patient schedule this (in a follow-up slot).   Please re-enforce the importance of compliance with treatment and the need for us to monitor compliance data.   Once you have spoken to the patient and scheduled the return appointment, you may close this encounter, thanks,   Saima Athar, MD, PhD [facility] ([facility]    

## 2014-05-24 NOTE — Telephone Encounter (Signed)
I called and left a message for the patient about his recent sleep study results. I informed the patient that the study confirmed the diagnosis of obstructive sleep apnea and that he did well on CPAP during the night of his study with significant improvement of respiratory events. Dr. Rexene Alberts recommend starting CPAP therapy at home. I will send the order to [facility]. I will fax a copy of the report to Dr. Nolon Rod office and mail a copy to the patient along with a follow up instruction letter.

## 2014-05-27 ENCOUNTER — Encounter: Payer: Self-pay | Admitting: *Deleted

## 2014-05-27 NOTE — Telephone Encounter (Signed)
Let's have him come back in the office next week, and we can set him up for the earliest appt in July for the EGD. Let's go ahead and put him on the books.

## 2014-05-27 NOTE — Telephone Encounter (Signed)
Patient has a F/U appointment with [facility] on 07/16 ?

## 2014-05-28 NOTE — Telephone Encounter (Signed)
I mailed him an appointment card

## 2014-05-28 NOTE — Telephone Encounter (Signed)
Not sure why that happened. He is an RMR patient.  Needs to be seen by myself or Magda Paganini next week. Let's get him on the books for EGD with Dr. Gala Romney earliest available in July.

## 2014-05-28 NOTE — Telephone Encounter (Signed)
I tentatively have in on the schedule w/[facility] on Wednesday July 1st, I have [facility] for patient to call and confirm

## 2014-05-30 NOTE — Telephone Encounter (Signed)
Patient is aware of appointment 06/05/14 w/[facility]

## 2014-05-30 NOTE — Telephone Encounter (Signed)
I LMOM

## 2014-05-30 NOTE — Telephone Encounter (Signed)
Pt called wanting to speak with Darius Bump. Please advise [phone]

## 2014-06-04 DIAGNOSIS — I4892 Unspecified atrial flutter: Secondary | ICD-10-CM | POA: Diagnosis not present

## 2014-06-04 DIAGNOSIS — Z6832 Body mass index (BMI) 32.0-32.9, adult: Secondary | ICD-10-CM | POA: Diagnosis not present

## 2014-06-04 DIAGNOSIS — I2510 Atherosclerotic heart disease of native coronary artery without angina pectoris: Secondary | ICD-10-CM | POA: Diagnosis not present

## 2014-06-04 DIAGNOSIS — I509 Heart failure, unspecified: Secondary | ICD-10-CM | POA: Diagnosis not present

## 2014-06-05 ENCOUNTER — Encounter: Payer: Self-pay | Admitting: Gastroenterology

## 2014-06-05 ENCOUNTER — Other Ambulatory Visit: Payer: Self-pay

## 2014-06-05 ENCOUNTER — Ambulatory Visit (INDEPENDENT_AMBULATORY_CARE_PROVIDER_SITE_OTHER): Payer: Medicare Other | Admitting: Gastroenterology

## 2014-06-05 ENCOUNTER — Telehealth: Payer: Self-pay

## 2014-06-05 ENCOUNTER — Telehealth: Payer: Self-pay | Admitting: Gastroenterology

## 2014-06-05 VITALS — BP 90/62 | HR 77 | Temp 97.0°F | Ht 70.0 in | Wt 215.8 lb

## 2014-06-05 DIAGNOSIS — K824 Cholesterolosis of gallbladder: Secondary | ICD-10-CM

## 2014-06-05 DIAGNOSIS — C20 Malignant neoplasm of rectum: Secondary | ICD-10-CM

## 2014-06-05 DIAGNOSIS — R7402 Elevation of levels of lactic acid dehydrogenase (LDH): Secondary | ICD-10-CM | POA: Diagnosis not present

## 2014-06-05 DIAGNOSIS — R740 Nonspecific elevation of levels of transaminase and lactic acid dehydrogenase [LDH]: Secondary | ICD-10-CM

## 2014-06-05 DIAGNOSIS — K219 Gastro-esophageal reflux disease without esophagitis: Secondary | ICD-10-CM | POA: Diagnosis not present

## 2014-06-05 DIAGNOSIS — R7401 Elevation of levels of liver transaminase levels: Secondary | ICD-10-CM | POA: Diagnosis not present

## 2014-06-05 DIAGNOSIS — I2589 Other forms of chronic ischemic heart disease: Secondary | ICD-10-CM

## 2014-06-05 NOTE — Telephone Encounter (Signed)
I have LMOM for patient to return my call.  

## 2014-06-05 NOTE — Progress Notes (Signed)
Referring Provider: Redmond School, MD Primary Care Physician:  Glo Herring., MD Primary GI: Dr. Gala Romney  Chief Complaint  Patient presents with  . Advice Only    needs egd with RMR    HPI:   [name] presents today in hospital follow-up after admission for SOB, abdominal pain, and no ostomy output for several days. ACS ruled out with negative troponins. Went into aflutter with questionable SVT, with cardiology consulted at that time. He was discharged 6/18 in good condition.Moderate stool burden noted on CT without evidence of bowel obstruction. Clinically improved from a GI standpoint during admission. Noted to have distal esophageal thickening on CT. Past history significant for stage III rectal carcinoma, diagnosed in 2008, s/p APR/chemotherapy radiation therapy. Last colonoscopy in 2012 with single pedunculated benign inflammatory polyp. Due for surveillance this year. Presents today to discuss EGD to rule out occult process due to esophageal thickening incidentally noted on CT.  BM every one to 2 days. No evidence of bleeding. No longer eating cheese popcorn. No abdominal pain. Occasional nausea for a day to a week, then goes a month without it. Will vomit just once in the morning during these episodes.Usually associated with vertigo. Eating grapes can be difficult if does not chew well. Otherwise no solid food dysphagia. Denies reflux symptoms on Protonix daily. Chronic PPI. On Eliquis for afib.   History of mildly elevated LFTs while on statin in April 2015. Known fatty liver. [place] of abdomen with possible gallbladder polyp. Needs surveillance in Oct 2015.     Past Medical History  Diagnosis Date  . Essential hypertension, benign   . Type 2 diabetes mellitus   . Coronary atherosclerosis of native coronary artery     BMS to LAD 2001 at [facility], PTCA/atherectomy ramus and BMS to LAD 2009  . S/P colonoscopy     Normal via ostomy - September 2009  . Chronic systolic  heart failure   . Myocardial infarction, anterior wall     Treated with tPA at [facility] 2000  . GERD (gastroesophageal reflux disease)   . Cardiomyopathy, ischemic     BIV ICD St. Jude, LVEF 23%  . Paroxysmal atrial fibrillation     Amiodarone and Coumadin  . 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 Prostate cancer     s/p seed implants with chemo and radiation  . 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 Transaminitis 12/12/2013  . CHF (congestive heart failure)   . TIA (transient ischemic attack)     Past Surgical History  Procedure Laterality Date  . Internal defibrillator and pacemaker  2010    x2 St. Jude device  . Abdominal and perineal resection of rectum with total mesorectal excision      10/04/2007  . Colonoscopy      05/17/2007. IMPRESSION: Semilunar, apple-core neoplasm low in the rectum (palpable on digital rectal exam) beginning at 5 cm and corkscrewing up 5 cm in length. This was a low rectal lesion consistent with colorectal carcinoma. It was biosied multiple times. The upstream colon all the way to the cecum appeared normal. Recommendations: Followup on path. Surgical Consultation   . Colonoscopy  09/14/2011    Dr. Gala Romney: via colostomy, Single pedunculated benign inflammatory polyp. Due for surveillance Oct 2015  . Colostomy      Current Outpatient Prescriptions  Medication Sig Dispense Refill  . amiodarone (PACERONE) 200 MG tablet Take 1 tablet (200 mg total) by mouth daily.  30 tablet  0  . apixaban (ELIQUIS) 5 MG TABS tablet Take 5 mg by mouth 2 (two) times daily.      Marland Kitchen aspirin 81 MG EC tablet Take 81 mg by mouth at bedtime.       . citalopram (CELEXA) 20 MG tablet Take 1 tablet (20 mg total) by mouth daily.  30 tablet  1  . clindamycin (CLEOCIN) 300 MG capsule Take 1 capsule (300 mg total) by mouth 3 (three) times daily.  30 capsule  0  . co-enzyme Q-10 50 MG capsule Take 50 mg by mouth every morning.       . digoxin (LANOXIN) 0.125 MG  tablet Take 1 tablet (0.125 mg total) by mouth every other day.  30 tablet  6  . fluticasone (FLONASE) 50 MCG/ACT nasal spray Place 1 spray into both nostrils daily as needed for allergies.       . furosemide (LASIX) 20 MG tablet Take 1 tablet (20 mg total) by mouth daily.  30 tablet  0  . insulin aspart (NOVOLOG) 100 UNIT/ML injection Inject 12-14 Units into the skin 3 (three) times daily as needed for high blood sugar. 14 units at breakfast and 12 units a lunch and 12 units at supper per sliding scale       . insulin glargine (LANTUS) 100 UNIT/ML injection Inject 24 Units into the skin at bedtime.       . meclizine (ANTIVERT) 25 MG tablet Take 1 tablet (25 mg total) by mouth 3 (three) times daily as needed for dizziness.  30 tablet  0  . metFORMIN (GLUCOPHAGE) 500 MG tablet Take 500 mg by mouth 2 (two) times daily with a meal.      . metoprolol succinate (TOPROL-XL) 50 MG 24 hr tablet Take 1 tablet (50 mg total) by mouth daily. Take with or immediately following a meal.  30 tablet  0  . niacin (NIASPAN) 1000 MG CR tablet Take 1,000 mg by mouth at bedtime.        . nitroGLYCERIN (NITROLINGUAL) 0.4 MG/SPRAY spray Place 1 spray under the tongue every 5 (five) minutes as needed. angina      . Omega-3 Fatty Acids (FISH OIL) 1200 MG CAPS Take 1,200 mg by mouth 2 (two) times daily.        . pantoprazole (PROTONIX) 40 MG tablet Take 1 tablet (40 mg total) by mouth daily.  30 tablet  6  . ranolazine (RANEXA) 1000 MG SR tablet Take 1 tablet (1,000 mg total) by mouth 2 (two) times daily.  60 tablet  9  . rosuvastatin (CRESTOR) 40 MG tablet Take 40 mg by mouth at bedtime.       No current facility-administered medications for this visit.    Allergies as of 06/05/2014  . (No Known Allergies)    Family History  Problem Relation Age of Onset  . Colon cancer Mother 80  . Colon cancer Sister 33  . Coronary artery disease Father   . Colon cancer Other     2 cousins, succumbed to illness    History    Social History  . Marital Status: Married    Spouse Name: N/A    Number of Children: 1  . Years of Education: N/A   Occupational History  .     Social History Main Topics  . Smoking status: Never Smoker   . Smokeless tobacco: Never Used  . Alcohol Use: No     Comment: Former user 45 years ago  .  Drug Use: No  . Sexual Activity: No   Other Topics Concern  . None   Social History Narrative  . None    Review of Systems: Gen: Denies fever, chills, anorexia. Denies fatigue, weakness, weight loss.  CV: Denies chest pain, palpitations, syncope, peripheral edema, and claudication. Resp: Denies dyspnea at rest, cough, wheezing, coughing up blood, and pleurisy.  GI: see HPI Derm: Denies rash, itching, dry skin Psych: Denies depression, anxiety, memory loss, confusion. No homicidal or suicidal ideation.  Heme: Denies bruising, bleeding, and enlarged lymph nodes.  Physical Exam: BP 90/62  Pulse 77  Temp(Src) 97 F (36.1 C) (Oral)  Ht 5\' 10"  (1.778 m)  Wt 215 lb 12.8 oz (97.886 kg)  BMI 30.96 kg/m2 General:   Alert and oriented. No distress noted. Pleasant and cooperative.  Head:  Normocephalic and atraumatic. Eyes:  Conjuctiva clear without scleral icterus. Mouth:  Oral mucosa pink and moist. Good dentition. No lesions. Heart:  S1, S2 present with irregularly irregular rhythm Abdomen:  +BS, soft, non-tender and non-distended. No rebound or guarding. LLQ ostomy bag intact.  Msk:  Symmetrical without gross deformities. Normal posture. Extremities:  Without edema. Neurologic:  Alert and  oriented x4;  grossly normal neurologically. Skin:  Intact without significant lesions or rashes. Psych:  Alert and cooperative. Normal mood and affect.  Lab Results  Component Value Date   WBC 9.3 05/23/2014   HGB 12.2* 05/23/2014   HCT 36.0* 05/23/2014   MCV 84.3 05/23/2014   PLT 161 05/23/2014   Lab Results  Component Value Date   ALT 31 05/22/2014   AST 48* 05/22/2014   ALKPHOS 55  05/22/2014   BILITOT 1.1 05/22/2014   CT June 2015 during hospitalization: IMPRESSION:  1. Negative for small bowel obstruction.  2. Cholelithiasis. No CT evidence of cholecystitis.  3. Abdominoperineal resection. Uncomplicated left lower quadrant end  colostomy. Moderate stool burden.  4. Mild thickening of the distal esophagus which can be associated  with gastroesophageal reflux.

## 2014-06-05 NOTE — Patient Instructions (Signed)
We have scheduled you for a colonoscopy, upper endoscopy, and possible dilation if your esophagus needs "stretching".   We are asking cardiology if we can stop the Eliquis several days before. We will let you know for sure as soon as possible.

## 2014-06-05 NOTE — Telephone Encounter (Signed)
Pt was returning [name]'s called pt said anytime around 9:00AM would be good. Please advise [phone]

## 2014-06-05 NOTE — Telephone Encounter (Signed)
Ok to hold eliquis. I would stop 2 days prior to procedure and resume 1 day after procedure   Zandra Abts MD

## 2014-06-05 NOTE — Telephone Encounter (Signed)
Leigh Ann: Please let patient know to hold Eliquis X 2 days prior to procedure. Thanks!

## 2014-06-05 NOTE — Telephone Encounter (Signed)
Dr. Morrell Riddle,   This pt was seen by Laban Emperor, NP today in our office.  He needs to be scheduled for Colonoscopy and EGD with Dr. Gala Romney.  Please advise if it is OK for him to hold his Eliquis for 2-3 days prior to having the procedures.

## 2014-06-05 NOTE — Assessment & Plan Note (Addendum)
58 year old male with diagnosis in 2008, s/p APR with last colonoscopy in 2012 with single pedunculated benign inflammatory polyp. Due for high risk surveillance now; both his mother and sister have positive history of colon cancer. No overt signs of GI bleeding; recent hospitalization with significant constipation in the setting of dietary behaviors. No CT evidence of obstruction.  Proceed with TCS with Dr. Gala Romney in near future: the risks, benefits, and alternatives have been discussed with the patient in detail. The patient states understanding and desires to proceed. HAS PACEMAKER/DEFIBRILLATOR ON ELIQUIS: OK PER CARDIOLOGY TO HOLD X 2 days No diabetes medications the day of procedure. 1/2 dose of Lantus evening prior

## 2014-06-05 NOTE — Assessment & Plan Note (Signed)
On [place] in April 2015. Repeat US in Oct 2015.

## 2014-06-05 NOTE — Progress Notes (Signed)
cc'd to pcp 

## 2014-06-05 NOTE — Telephone Encounter (Signed)
Lab order on file. 

## 2014-06-05 NOTE — Assessment & Plan Note (Signed)
Mild fluctuations in transaminases in setting of known fatty liver. Recheck in 6 weeks. Further work-up if persistent elevations.

## 2014-06-05 NOTE — Assessment & Plan Note (Addendum)
Chronic but without any breakthrough symptoms on Protonix daily. Incidental finding of esophageal wall thickening on CT in June 2015. Likely due to chronic GERD; however, unable to exclude Barrett's, occult malignancy. Discussed need for EGD for further evaluation at time of TCS. As of note, reports vague swallowing difficulties with grapes, which is likely due to not chewing well enough. No other solid food dysphagia. Consider dilation at time of EGD if clinically appropriate.  Proceed with upper endoscopy/dilation in the near future with Dr. Gala Romney. The risks, benefits, and alternatives have been discussed in detail with patient. They have stated understanding and desire to proceed.  On Eliquis; OK PER CARDIOLOGY TO HOLD X 2 DAYS Continue Protonix daily

## 2014-06-05 NOTE — Telephone Encounter (Signed)
[name], please nic u/s in Oct. 2015

## 2014-06-05 NOTE — Telephone Encounter (Signed)
Please have patient complete an [place] of abdomen in Oct 2015 for surveillance of ?gallbladder polyp.  Repeat LFTs in 6 weeks.

## 2014-06-06 ENCOUNTER — Other Ambulatory Visit: Payer: Self-pay | Admitting: Internal Medicine

## 2014-06-06 ENCOUNTER — Encounter: Payer: Self-pay | Admitting: Adult Health

## 2014-06-06 ENCOUNTER — Ambulatory Visit (INDEPENDENT_AMBULATORY_CARE_PROVIDER_SITE_OTHER): Payer: Medicare Other | Admitting: Adult Health

## 2014-06-06 VITALS — BP 88/60 | HR 93 | Ht 70.0 in | Wt 217.0 lb

## 2014-06-06 DIAGNOSIS — R7401 Elevation of levels of liver transaminase levels: Secondary | ICD-10-CM

## 2014-06-06 DIAGNOSIS — I2589 Other forms of chronic ischemic heart disease: Secondary | ICD-10-CM

## 2014-06-06 DIAGNOSIS — I10 Essential (primary) hypertension: Secondary | ICD-10-CM | POA: Diagnosis not present

## 2014-06-06 DIAGNOSIS — R740 Nonspecific elevation of levels of transaminase and lactic acid dehydrogenase [LDH]: Secondary | ICD-10-CM

## 2014-06-06 DIAGNOSIS — I4891 Unspecified atrial fibrillation: Secondary | ICD-10-CM

## 2014-06-06 DIAGNOSIS — K219 Gastro-esophageal reflux disease without esophagitis: Secondary | ICD-10-CM

## 2014-06-06 DIAGNOSIS — I480 Paroxysmal atrial fibrillation: Secondary | ICD-10-CM

## 2014-06-06 DIAGNOSIS — C20 Malignant neoplasm of rectum: Secondary | ICD-10-CM

## 2014-06-06 DIAGNOSIS — I5023 Acute on chronic systolic (congestive) heart failure: Secondary | ICD-10-CM

## 2014-06-06 MED ORDER — PEG 3350-KCL-NA BICARB-NACL 420 G PO SOLR: 4000.0000 mL | ORAL | Status: DC

## 2014-06-06 NOTE — Telephone Encounter (Signed)
Reminder in EPIC 

## 2014-06-06 NOTE — Telephone Encounter (Signed)
TCS/EGD+/-ED is scheduled for 07/02/14 and [name] is aware and I have mailed him his instructions

## 2014-06-06 NOTE — Assessment & Plan Note (Signed)
Heart rate is well controlled currently. He continues on amiodarone, metoprolol, and digoxin. He medications are provided and managed by Dr. Andree Elk in [facility]. I am reluctant to change medications or manipulate them as he has another cardiology provider doing the same. We will see him in one year unless symptomatic.

## 2014-06-06 NOTE — Progress Notes (Deleted)
Name: [name]    DOB: 04/18/1956  Age: 58 y.o.  MR#: [id number]       PCP:  Glo Herring., MD      Insurance: Payor: MEDICARE / Plan: MEDICARE PART A AND B / Product Type: *No Product type* /   CC:    Chief Complaint  Patient presents with  . Coronary Artery Disease  . Cardiomyopathy  . Atrial Flutter    VS Filed Vitals:   06/06/14 1413  BP: 88/60  Pulse: 93  Height: 5\' 10"  (1.778 m)  Weight: 217 lb (98.431 kg)  SpO2: 95%    Weights Current Weight  06/06/14 217 lb (98.431 kg)  06/05/14 215 lb 12.8 oz (97.886 kg)  05/22/14 220 lb 7.4 oz (100 kg)    Blood Pressure  BP Readings from Last 3 Encounters:  06/06/14 88/60  06/05/14 90/62  05/22/14 110/73     Admit date:  (Not on file) Last encounter with RMR:  Visit date not found   Allergy Review of patient's allergies indicates no known allergies.  Current Outpatient Prescriptions  Medication Sig Dispense Refill  . amiodarone (PACERONE) 200 MG tablet Take 1 tablet (200 mg total) by mouth daily.  30 tablet  0  . apixaban (ELIQUIS) 5 MG TABS tablet Take 5 mg by mouth 2 (two) times daily.      Marland Kitchen aspirin 81 MG EC tablet Take 81 mg by mouth at bedtime.       . citalopram (CELEXA) 20 MG tablet Take 1 tablet (20 mg total) by mouth daily.  30 tablet  1  . clindamycin (CLEOCIN) 300 MG capsule Take 1 capsule (300 mg total) by mouth 3 (three) times daily.  30 capsule  0  . co-enzyme Q-10 50 MG capsule Take 50 mg by mouth every morning.       . digoxin (LANOXIN) 0.125 MG tablet Take 1 tablet (0.125 mg total) by mouth every other day.  30 tablet  6  . fluticasone (FLONASE) 50 MCG/ACT nasal spray Place 1 spray into both nostrils daily as needed for allergies.       . furosemide (LASIX) 20 MG tablet Take 1 tablet (20 mg total) by mouth daily.  30 tablet  0  . insulin aspart (NOVOLOG) 100 UNIT/ML injection Inject 12-14 Units into the skin 3 (three) times daily as needed for high blood sugar. 14 units at breakfast and 12 units a  lunch and 12 units at supper per sliding scale       . insulin glargine (LANTUS) 100 UNIT/ML injection Inject 24 Units into the skin at bedtime.       . meclizine (ANTIVERT) 25 MG tablet Take 1 tablet (25 mg total) by mouth 3 (three) times daily as needed for dizziness.  30 tablet  0  . metFORMIN (GLUCOPHAGE) 500 MG tablet Take 500 mg by mouth 2 (two) times daily with a meal.      . metoprolol succinate (TOPROL-XL) 50 MG 24 hr tablet Take 1 tablet (50 mg total) by mouth daily. Take with or immediately following a meal.  30 tablet  0  . niacin (NIASPAN) 1000 MG CR tablet Take 1,000 mg by mouth at bedtime.        . nitroGLYCERIN (NITROLINGUAL) 0.4 MG/SPRAY spray Place 1 spray under the tongue every 5 (five) minutes as needed. angina      . Omega-3 Fatty Acids (FISH OIL) 1200 MG CAPS Take 1,200 mg by mouth 2 (two) times daily.        Marland Kitchen  pantoprazole (PROTONIX) 40 MG tablet Take 1 tablet (40 mg total) by mouth daily.  30 tablet  6  . polyethylene glycol-electrolytes (TRILYTE) 420 G solution Take 4,000 mLs by mouth as directed.  4000 mL  0  . ranolazine (RANEXA) 1000 MG SR tablet Take 1 tablet (1,000 mg total) by mouth 2 (two) times daily.  60 tablet  9  . rosuvastatin (CRESTOR) 40 MG tablet Take 40 mg by mouth at bedtime.       No current facility-administered medications for this visit.    Discontinued Meds:   There are no discontinued medications.  Patient Active Problem List   Diagnosis Date Noted  . GERD (gastroesophageal reflux disease) 06/05/2014  . V-tach 05/21/2014  . ACS (acute coronary syndrome) 05/19/2014  . N&V (nausea and vomiting) 03/07/2014  . Gallbladder polyp 03/06/2014  . TIA (transient ischemic attack) 03/01/2014  . DM type 2, uncontrolled, with renal complications 04/88/8916  . Acute-on-chronic renal failure 01/26/2014  . Dizziness 01/26/2014  . Obesity 12/12/2013  . Influenza-like illness 12/12/2013  . Hypovolemia 12/12/2013  . Atypical pneumonia 12/12/2013  .  Transaminitis 12/12/2013  . Hypotension 12/11/2013  .  CKD III 12/11/2013  . Acute viral syndrome 12/11/2013  . Atrial flutter 10/24/2013  . Atrial fibrillation with RVR 10/23/2013  . Chronic systolic heart failure 94/50/3888  . Acute respiratory failure with hypoxia 07/29/2013  . Acute on chronic clinical systolic heart failure 28/00/3491  . Dyslipidemia- (LDL 160, previously in the 50s) 07/11/2013  . SOB (shortness of breath) 07/10/2013  . Hyperkalemia 07/10/2013  . Renal insufficiency- appears to be new 07/10/2013  . Unstable angina 07/10/2013  . CAD with LCX disease and stents to LAD 07/10/2013  . PAF (paroxysmal atrial fibrillation) 03/23/2013  . Long term (current) use of anticoagulants 03/23/2013  . Family history of colon cancer 08/17/2011  . ADENOCARCINOMA, RECTUM radiation/ chemo/ surg 2008 06/20/2008  . DIABETES MELLITUS 06/20/2008  . HYPERTENSION 06/20/2008  . MYOCARDIAL INFARCTION 06/20/2008  . CARDIOMYOPATHY, ISCHEMIC, with BiV ICD, st Jude EF 23% 06/20/2008    LABS    Component Value Date/Time   NA 134* 05/23/2014 0550   NA 134* 05/22/2014 0500   NA 129* 05/21/2014 0500   K 3.8 05/23/2014 0550   K 4.0 05/22/2014 0500   K 4.4 05/21/2014 0500   CL 98 05/23/2014 0550   CL 96 05/22/2014 0500   CL 92* 05/21/2014 0500   CO2 22 05/23/2014 0550   CO2 24 05/22/2014 0500   CO2 20 05/21/2014 0500   GLUCOSE 116* 05/23/2014 0550   GLUCOSE 123* 05/22/2014 0500   GLUCOSE 172* 05/21/2014 0500   BUN 19 05/23/2014 0550   BUN 22 05/22/2014 0500   BUN 26* 05/21/2014 0500   CREATININE 1.33 05/23/2014 0550   CREATININE 1.49* 05/22/2014 0500   CREATININE 1.66* 05/21/2014 0500   CREATININE 1.63* 01/10/2014 1059   CREATININE 1.33 08/02/2013 1109   CREATININE 1.72* 06/18/2013 1006   CALCIUM 8.6 05/23/2014 0550   CALCIUM 8.7 05/22/2014 0500   CALCIUM 9.1 05/21/2014 0500   GFRNONAA 57* 05/23/2014 0550   GFRNONAA 50* 05/22/2014 0500   GFRNONAA 44* 05/21/2014 0500   GFRAA 66* 05/23/2014 0550   GFRAA 58*  05/22/2014 0500   GFRAA 51* 05/21/2014 0500   CMP     Component Value Date/Time   NA 134* 05/23/2014 0550   K 3.8 05/23/2014 0550   CL 98 05/23/2014 0550   CO2 22 05/23/2014 0550   GLUCOSE 116* 05/23/2014 0550  BUN 19 05/23/2014 0550   CREATININE 1.33 05/23/2014 0550   CREATININE 1.63* 01/10/2014 1059   CALCIUM 8.6 05/23/2014 0550   PROT 6.7 05/22/2014 0500   ALBUMIN 2.6* 05/22/2014 0500   AST 48* 05/22/2014 0500   ALT 31 05/22/2014 0500   ALKPHOS 55 05/22/2014 0500   BILITOT 1.1 05/22/2014 0500   GFRNONAA 57* 05/23/2014 0550   GFRAA 66* 05/23/2014 0550       Component Value Date/Time   WBC 9.3 05/23/2014 0550   WBC 12.8* 05/21/2014 0500   WBC 11.3* 05/20/2014 0440   WBC 9.3 05/02/2012 0852   WBC 5.0 11/09/2011 0838   WBC 5.2 05/11/2011 0932   HGB 12.2* 05/23/2014 0550   HGB 12.5* 05/21/2014 0500   HGB 12.1* 05/20/2014 0440   HGB 14.6 05/02/2012 0852   HGB 13.3 11/09/2011 0838   HGB 13.5 05/11/2011 0932   HCT 36.0* 05/23/2014 0550   HCT 36.8* 05/21/2014 0500   HCT 35.9* 05/20/2014 0440   HCT 43.6 05/02/2012 0852   HCT 39.4 11/09/2011 0838   HCT 39.7 05/11/2011 0932   MCV 84.3 05/23/2014 0550   MCV 83.4 05/21/2014 0500   MCV 84.3 05/20/2014 0440   MCV 93.3 05/02/2012 0852   MCV 97.3 11/09/2011 0838   MCV 96.5 05/11/2011 0932    Lipid Panel     Component Value Date/Time   CHOL 115 03/01/2014 0508   TRIG 149 03/01/2014 0508   HDL 34* 03/01/2014 0508   CHOLHDL 3.4 03/01/2014 0508   VLDL 30 03/01/2014 0508   LDLCALC 51 03/01/2014 0508    ABG    Component Value Date/Time   PHART 7.440 10/04/2007 0915   PCO2ART 33.7* 10/04/2007 0915   PO2ART 359.0* 10/04/2007 0915   HCO3 23.1 10/04/2007 0915   TCO2 21 03/23/2010 1725   ACIDBASEDEF 1.0 10/04/2007 0915   O2SAT 100.0 10/04/2007 0915     Lab Results  Component Value Date   TSH 1.640 05/19/2014   BNP (last 3 results)  Recent Labs  01/26/14 2128 02/28/14 2309 05/19/14 0203  PROBNP 2348.0* 1079.0* 2631.0*   Cardiac Panel (last 3 results) No  results found for this basename: CKTOTAL, CKMB, TROPONINI, RELINDX,  in the last 72 hours  Iron/TIBC/Ferritin/ %Sat No results found for this basename: iron, tibc, ferritin, ironpctsat     EKG Orders placed during the hospital encounter of 05/19/14  . EKG 12-LEAD  . EKG 12-LEAD  . EKG 12-LEAD  . EKG 12-LEAD  . EKG  . EKG 12-LEAD  . EKG 12-LEAD  . EKG 12-LEAD  . EKG 12-LEAD     Prior Assessment and Plan Problem List as of 06/06/2014     Cardiovascular and Mediastinum   HYPERTENSION   Last Assessment & Plan   11/08/2013 Office Visit Written 11/08/2013  5:09 PM by Lorretta Harp, MD     Controlled on current medications    MYOCARDIAL INFARCTION   CARDIOMYOPATHY, ISCHEMIC, with BiV ICD, st Jude EF 23%   Last Assessment & Plan   11/08/2013 Office Visit Written 11/08/2013  5:08 PM by Lorretta Harp, MD     Patient has a history of ischemic coronary myopathy. In the interval myocardial infarctionin 2002 with stenting of the LAD. His EF is a bit in the 25% range. He has a bi-V. ICD placed by Dr. Caryl Comes in the past. He's had admissions for congestive heart failure. He denies chest pain. I performed cardiac catheterization on him 07/13/13 revealed a patent LAD stent,  segmental disease in his diagonal branch which was non-revascularizable. His RCA was nondominant. Medical therapy was recommended.    PAF (paroxysmal atrial fibrillation)   Unstable angina   CAD with LCX disease and stents to LAD   Acute on chronic clinical systolic heart failure   Chronic systolic heart failure   Last Assessment & Plan   02/11/2014 Office Visit Written 02/11/2014 11:03 AM by Evans Lance, MD     His heart failure symptoms are well compensated. He will continue his current meds. He will maintain a low sodium diet.     Atrial fibrillation with RVR   Last Assessment & Plan   11/08/2013 Office Visit Written 11/08/2013  5:09 PM by Lorretta Harp, MD     His A. Fib burden by interrogation of this by the ICD today  was over 50%. He is on Coumadin anticoagulation    Atrial flutter   Last Assessment & Plan   02/11/2014 Office Visit Written 02/11/2014 11:00 AM by Evans Lance, MD     He is in atrial flutter today. Unclear how much has been flutter vs fib. He has been on amio 400 daily. I have asked the patient to reduce his dose to 200 mg daily. We discussed catheter ablation but he is totally asymptomatic. I have asked him to call if he develops increased sob.     Hypotension   TIA (transient ischemic attack)   ACS (acute coronary syndrome)   V-tach     Respiratory   Acute respiratory failure with hypoxia   Atypical pneumonia     Digestive   ADENOCARCINOMA, RECTUM radiation/ chemo/ surg 2008   Last Assessment & Plan   06/05/2014 Office Visit Edited 06/05/2014 12:25 PM by Orvil Feil, NP     58 year old male with diagnosis in 2008, s/p APR with last colonoscopy in 2012 with single pedunculated benign inflammatory polyp. Due for high risk surveillance now; both his mother and sister have positive history of colon cancer. No overt signs of GI bleeding; recent hospitalization with significant constipation in the setting of dietary behaviors. No CT evidence of obstruction.  Proceed with TCS with Dr. Gala Romney in near future: the risks, benefits, and alternatives have been discussed with the patient in detail. The patient states understanding and desires to proceed. HAS PACEMAKER/DEFIBRILLATOR ON ELIQUIS: OK PER CARDIOLOGY TO HOLD X 2 days No diabetes medications the day of procedure. 1/2 dose of Lantus evening prior    N&V (nausea and vomiting)   Last Assessment & Plan   03/06/2014 Office Visit Written 03/07/2014  4:36 PM by Orvil Feil, NP     Resolved with treatment of vertigo. Otherwise, no other concerns. No need for EGD unless recurrent symptoms.     GERD (gastroesophageal reflux disease)   Last Assessment & Plan   06/05/2014 Office Visit Edited 06/05/2014 12:25 PM by Orvil Feil, NP     Chronic but without any  breakthrough symptoms on Protonix daily. Incidental finding of esophageal wall thickening on CT in June 2015. Likely due to chronic GERD; however, unable to exclude Barrett's, occult malignancy. Discussed need for EGD for further evaluation at time of TCS. As of note, reports vague swallowing difficulties with grapes, which is likely due to not chewing well enough. No other solid food dysphagia. Consider dilation at time of EGD if clinically appropriate.  Proceed with upper endoscopy/dilation in the near future with Dr. Gala Romney. The risks, benefits, and alternatives have been discussed in detail with  patient. They have stated understanding and desire to proceed.  On Eliquis; OK PER CARDIOLOGY TO HOLD X 2 DAYS Continue Protonix daily      Endocrine   DIABETES MELLITUS   Last Assessment & Plan   08/17/2011 Office Visit Written 08/17/2011  1:42 PM by Andria Meuse, NP     Insulin and medication instructions given.    DM type 2, uncontrolled, with renal complications     Genitourinary   Renal insufficiency- appears to be new    CKD III   Acute-on-chronic renal failure     Other   Dyslipidemia- (LDL 160, previously in the 49s)   Last Assessment & Plan   11/08/2013 Office Visit Written 11/08/2013  5:10 PM by Lorretta Harp, MD     On statin therapy with recent lipid profile performed 08/02/13 revealed a total social 182, LDL of 121 and HDL of 182    Family history of colon cancer   Last Assessment & Plan   08/17/2011 Office Visit Written 08/17/2011  1:43 PM by Andria Meuse, NP     Strong family history with both mother and sister with colon cancer as well.    Long term (current) use of anticoagulants   SOB (shortness of breath)   Hyperkalemia   Acute viral syndrome   Obesity   Influenza-like illness   Hypovolemia   Transaminitis   Last Assessment & Plan   06/05/2014 Office Visit Written 06/05/2014 10:59 AM by Orvil Feil, NP     Mild fluctuations in transaminases in setting of known  fatty liver. Recheck in 6 weeks. Further work-up if persistent elevations.     Dizziness   Gallbladder polyp   Last Assessment & Plan   06/05/2014 Office Visit Written 06/05/2014 10:59 AM by Orvil Feil, NP     On [place] in April 2015. Repeat US in Oct 2015.         Imaging: Ct Abdomen Pelvis Wo Contrast  05/20/2014   CLINICAL DATA:  Short of breath.  Dizziness.  Nausea and vomiting.  EXAM: CT ABDOMEN AND PELVIS WITHOUT CONTRAST  TECHNIQUE: Multidetector CT imaging of the abdomen and pelvis was performed following the standard protocol without IV contrast.  COMPARISON:  CT 05/26/2010.  Radiographs 05/19/2014.  FINDINGS: Bones: No aggressive osseous lesions. Moderate thoracolumbar spondylosis. Calcified disc protrusion at L5-S1.  Lung Bases: Dependent atelectasis. Scarring in the right middle lobe and lingula. Partially visualized pacemaker leads in the heart.  Liver: Unenhanced CT was performed per clinician order. Lack of IV contrast limits sensitivity and specificity, especially for evaluation of abdominal/pelvic solid viscera. Normal.  Spleen:  Normal.  Gallbladder: Contracted with cholelithiasis. No inflammatory changes by CT.  Common bile duct: No dilation or calcified common duct stone identified.  Pancreas:  Normal.  Adrenal glands:  Normal bilaterally.  Kidneys:  No calculi.  Both ureters appear within normal limits.  Stomach: Mild thickening of the distal esophagus, suggesting gastroesophageal reflux. The stomach is collapsed. No inflammatory changes.  Small bowel: Duodenum appears normal. There is no bowel dilation. No evidence of obstruction, which was suggested on the prior radiograph series. Normal opacification of small bowel. No mesenteric adenopathy.  Colon: Normal appendix. Colon is moderately distended with stool. Left lower quadrant end colostomy. Resection of the rectosigmoid with surgical clips and unchanged soft tissue in the presacral region.  Pelvic Genitourinary: Posterior traction of  the urinary bladder, likely associated with scar tissue with an elongated appearance. No inflammatory changes.  Vasculature: Atherosclerosis.  Body Wall: Scarring in the midline of the abdominal wall. Tiny fat containing peristomal hernia.  IMPRESSION: 1. Negative for small bowel obstruction. 2. Cholelithiasis.  No CT evidence of cholecystitis. 3. Abdominoperineal resection. Uncomplicated left lower quadrant end colostomy. Moderate stool burden. 4. Mild thickening of the distal esophagus which can be associated with gastroesophageal reflux.   Electronically Signed   By: Dereck Ligas M.D.   On: 05/20/2014 19:18   Dg Chest 2 View  05/19/2014   CLINICAL DATA:  Shortness of breath.  EXAM: CHEST  2 VIEW  COMPARISON:  Chest radiograph performed 02/28/2014  FINDINGS: The lungs are well-aerated. Mild bibasilar opacities may reflect atelectasis or possibly mild pneumonia. There is no evidence of focal opacification, pleural effusion or pneumothorax.  The heart is borderline normal in size. A pacemaker/AICD is noted at the left chest wall, with leads ending at the right atrium, right ventricle and coronary sinus. No acute osseous abnormalities are seen.  IMPRESSION: Mild bibasilar airspace opacities may reflect atelectasis or possibly pneumonia.   Electronically Signed   By: Garald Balding M.D.   On: 05/19/2014 03:13   Dg Abd Acute W/chest  05/19/2014   CLINICAL DATA:  Abdominal discomfort.  Constipated.  EXAM: ACUTE ABDOMEN SERIES (ABDOMEN 2 VIEW & CHEST 1 VIEW)  COMPARISON:  Chest x-ray 05/19/2014  FINDINGS: Left AICD remains in place, unchanged. Cardiomegaly. No confluent opacities, effusions or edema.  Large stool burden throughout the colon. Mildly prominent left abdominal small bowel loops with scattered air-fluid levels. No free air organomegaly. No suspicious calcification. Degenerative changes in the lumbar spine and hips.  IMPRESSION: Large stool burden.  Mildly prominent left abdominal small bowel loops.  While this may reflect focal ileus, I cannot exclude early small bowel obstruction.  Cardiomegaly.   Electronically Signed   By: Rolm Baptise M.D.   On: 05/19/2014 11:54

## 2014-06-06 NOTE — Progress Notes (Signed)
HPI: [name] is a 58 year old patient of Dr. Harl [name] we are following for ongoing assessment and management of CAD, with bare-metal stent to the LAD in 2001, repeat bare-metal stent in 2009 to LAD, ischemic area myopathy, with IV ICD placed by Dr. Caryl [name], most recent cardiac catheterization in 2014 revealing a tight nondominant RCA lesion with medical management, also history of hypertension, A. fib flutter on amiodarone 200 mg daily, hyperlipidemia, with other history to include diabetes.  The patient was last seen by Dr. Harl [name] in June of 2015. Most recent echocardiogram demonstrated LVEF of 30-35%. He was continued on medical therapy, with no up titration of beta blocker due to complaints of fatigue at higher doses. He was continued on anticoagulation with Coumadin.  Unfortunately he was admitted to [facility] in the setting of recurrent chest pain. He was ruled out for ACS. He was continued on digoxin, changed Eliquis from Coumadin, and remained on amiodarone. Was noted the discomfort in his chest is related to significant constipation, which dissipated with use of contrast for CT scan of the abdomen.  He [name] today without complaints. He is being followed by Dr. Andree [name] in [facility] for cardiology as well. He wants to continue with him   No Known Allergies  Current Outpatient Prescriptions  Medication Sig Dispense Refill  . amiodarone (PACERONE) 200 MG tablet Take 1 tablet (200 mg total) by mouth daily.  30 tablet  0  . apixaban (ELIQUIS) 5 MG TABS tablet Take 5 mg by mouth 2 (two) times daily.      Marland Kitchen aspirin 81 MG EC tablet Take 81 mg by mouth at bedtime.       . citalopram (CELEXA) 20 MG tablet Take 1 tablet (20 mg total) by mouth daily.  30 tablet  1  . clindamycin (CLEOCIN) 300 MG capsule Take 1 capsule (300 mg total) by mouth 3 (three) times daily.  30 capsule  0  . co-enzyme Q-10 50 MG capsule Take 50 mg by mouth every morning.       . digoxin (LANOXIN) 0.125 MG tablet  Take 1 tablet (0.125 mg total) by mouth every other day.  30 tablet  6  . fluticasone (FLONASE) 50 MCG/ACT nasal spray Place 1 spray into both nostrils daily as needed for allergies.       . furosemide (LASIX) 20 MG tablet Take 1 tablet (20 mg total) by mouth daily.  30 tablet  0  . insulin aspart (NOVOLOG) 100 UNIT/ML injection Inject 12-14 Units into the skin 3 (three) times daily as needed for high blood sugar. 14 units at breakfast and 12 units a lunch and 12 units at supper per sliding scale       . insulin glargine (LANTUS) 100 UNIT/ML injection Inject 24 Units into the skin at bedtime.       . meclizine (ANTIVERT) 25 MG tablet Take 1 tablet (25 mg total) by mouth 3 (three) times daily as needed for dizziness.  30 tablet  0  . metFORMIN (GLUCOPHAGE) 500 MG tablet Take 500 mg by mouth 2 (two) times daily with a meal.      . metoprolol succinate (TOPROL-XL) 50 MG 24 hr tablet Take 1 tablet (50 mg total) by mouth daily. Take with or immediately following a meal.  30 tablet  0  . niacin (NIASPAN) 1000 MG CR tablet Take 1,000 mg by mouth at bedtime.        . nitroGLYCERIN (NITROLINGUAL) 0.4 MG/SPRAY spray Place  1 spray under the tongue every 5 (five) minutes as needed. angina      . Omega-3 Fatty Acids (FISH OIL) 1200 MG CAPS Take 1,200 mg by mouth 2 (two) times daily.        . pantoprazole (PROTONIX) 40 MG tablet Take 1 tablet (40 mg total) by mouth daily.  30 tablet  6  . polyethylene glycol-electrolytes (TRILYTE) 420 G solution Take 4,000 mLs by mouth as directed.  4000 mL  0  . ranolazine (RANEXA) 1000 MG SR tablet Take 1 tablet (1,000 mg total) by mouth 2 (two) times daily.  60 tablet  9  . rosuvastatin (CRESTOR) 40 MG tablet Take 40 mg by mouth at bedtime.       No current facility-administered medications for this visit.    Past Medical History  Diagnosis Date  . Essential hypertension, benign   . Type 2 diabetes mellitus   . Coronary atherosclerosis of native coronary artery     BMS  to LAD 2001 [facility], PTCA/atherectomy ramus and BMS to LAD 2009  . S/P colonoscopy     Normal via ostomy - September 2009  . Chronic systolic heart failure   . Myocardial infarction, anterior wall     [facility]  . GERD (gastroesophageal reflux disease)   . Cardiomyopathy, ischemic     BIV ICD St. Jude, LVEF 23%  . Paroxysmal atrial fibrillation     Amiodarone and Coumadin  . 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 Prostate cancer     s/p seed implants with chemo and radiation  . 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 Transaminitis 12/12/2013  . CHF (congestive heart failure)   . TIA (transient ischemic attack)     Past Surgical History  Procedure Laterality Date  . Internal defibrillator and pacemaker  2010    x2 St. Jude device  . Abdominal and perineal resection of rectum with total mesorectal excision      10/04/2007  . Colonoscopy      05/17/2007. IMPRESSION: Semilunar, apple-core neoplasm low in the rectum (palpable on digital rectal exam) beginning at 5 cm and corkscrewing up 5 cm in length. This was a low rectal lesion consistent with colorectal carcinoma. It was biosied multiple times. The upstream colon all the way to the cecum appeared normal. Recommendations: Followup on path. Surgical Consultation   . Colonoscopy  09/14/2011    Dr. Gala Romney: via colostomy, Single pedunculated benign inflammatory polyp. Due for surveillance Oct 2015  . Colostomy      ROS: Review of systems complete and found to be negative unless listed above   PHYSICAL EXAM BP 88/60  Pulse 93  Ht 5\' 10"  (1.778 m)  Wt 217 lb (98.431 kg)  BMI 31.14 kg/m2  SpO2 95% General: Well developed, well nourished, in no acute distress Head: Eyes PERRLA, No xanthomas.   Normal cephalic and atramatic  Lungs: Clear bilaterally to auscultation and percussion. Heart: HRRR S1 S2, without MRG.  Pulses are 2+ & equal.            No carotid bruit. No JVD.  No abdominal  bruits. No femoral bruits. Abdomen: Bowel sounds are positive, abdomen soft and non-tender without masses or                  Hernia's noted. Msk:  Back normal, normal gait. Normal strength and tone for age. Extremities: No clubbing, cyanosis or edema.  DP +  1 Neuro: Alert and oriented X 3. Psych:  Good affect, responds appropriately     ASSESSMENT AND PLAN

## 2014-06-06 NOTE — Assessment & Plan Note (Signed)
No evidence of fluid overload, no DOE or LE  He is not having significant DOE. Will continue low salt diet.

## 2014-06-06 NOTE — Assessment & Plan Note (Addendum)
He is hypotensive today. His EF is 35% and would expect him to have low normal BP but not as low as 88/60. He complains of dizziness all the time, but especially with position changes. It is my suggestion that he decrease his lisinopril from 10 mg BID to once daily. Again, I am reluctant to change his medications as he is to see Dr. Andree Elk in 2 weeks.  If BP is lower or he is having syncope, he is to decrease the dose and report this to Dr. Andree Elk. I have discussed with him that ONE doctor needs to be in charge of medications as it will be confusing to go back and forth. He will be seen in one year unless symptomatic as he is seeing Dr. Andree Elk more regularly.

## 2014-06-06 NOTE — Patient Instructions (Signed)
Your physician wants you to follow-up in: 1 year with DrBranch You will receive a reminder letter in the mail two months in advance. If you don't receive a letter, please call our office to schedule the follow-up appointment.     Your physician recommends that you continue on your current medications as directed. Please refer to the Current Medication list given to you today.      Thank you for choosing [facility] !        

## 2014-06-11 ENCOUNTER — Encounter: Payer: Self-pay | Admitting: Cardiovascular Disease

## 2014-06-13 DIAGNOSIS — Z6832 Body mass index (BMI) 32.0-32.9, adult: Secondary | ICD-10-CM | POA: Diagnosis not present

## 2014-06-13 DIAGNOSIS — J984 Other disorders of lung: Secondary | ICD-10-CM | POA: Diagnosis not present

## 2014-06-13 DIAGNOSIS — J301 Allergic rhinitis due to pollen: Secondary | ICD-10-CM | POA: Diagnosis not present

## 2014-06-20 ENCOUNTER — Ambulatory Visit: Payer: Medicare Other | Admitting: Gastroenterology

## 2014-06-24 ENCOUNTER — Other Ambulatory Visit: Payer: Self-pay | Admitting: *Deleted

## 2014-06-24 DIAGNOSIS — R740 Nonspecific elevation of levels of transaminase and lactic acid dehydrogenase [LDH]: Principal | ICD-10-CM

## 2014-06-24 DIAGNOSIS — R0609 Other forms of dyspnea: Secondary | ICD-10-CM | POA: Diagnosis not present

## 2014-06-24 DIAGNOSIS — I5022 Chronic systolic (congestive) heart failure: Secondary | ICD-10-CM | POA: Diagnosis not present

## 2014-06-24 DIAGNOSIS — R7401 Elevation of levels of liver transaminase levels: Secondary | ICD-10-CM

## 2014-06-27 ENCOUNTER — Encounter (HOSPITAL_COMMUNITY): Payer: Self-pay | Admitting: Pharmacy Technician

## 2014-07-02 ENCOUNTER — Telehealth: Payer: Self-pay | Admitting: General Practice

## 2014-07-02 ENCOUNTER — Emergency Department (HOSPITAL_COMMUNITY)
Admission: EM | Admit: 2014-07-02 | Discharge: 2014-07-02 | Disposition: A | Discharge status: Home / Self Care | Payer: Medicare Other | Source: Home / Self Care | Attending: Emergency Medicine | Admitting: Emergency Medicine

## 2014-07-02 ENCOUNTER — Encounter (HOSPITAL_COMMUNITY): Payer: Self-pay | Admitting: *Deleted

## 2014-07-02 ENCOUNTER — Other Ambulatory Visit: Payer: Self-pay

## 2014-07-02 ENCOUNTER — Emergency Department (HOSPITAL_COMMUNITY)
Admission: RE | Admit: 2014-07-02 | Discharge: 2014-07-02 | Disposition: A | Discharge status: Home / Self Care | Payer: Medicare Other | Source: Ambulatory Visit | Attending: Internal Medicine | Admitting: Internal Medicine

## 2014-07-02 ENCOUNTER — Emergency Department (HOSPITAL_COMMUNITY): Payer: Medicare Other

## 2014-07-02 ENCOUNTER — Encounter: Payer: Self-pay | Admitting: Internal Medicine

## 2014-07-02 ENCOUNTER — Encounter (HOSPITAL_COMMUNITY): Payer: Self-pay | Admitting: Emergency Medicine

## 2014-07-02 ENCOUNTER — Encounter (HOSPITAL_COMMUNITY): Admission: RE | Disposition: A | Discharge status: Home / Self Care | Payer: Self-pay | Source: Ambulatory Visit

## 2014-07-02 DIAGNOSIS — R7401 Elevation of levels of liver transaminase levels: Secondary | ICD-10-CM | POA: Diagnosis not present

## 2014-07-02 DIAGNOSIS — Z95810 Presence of automatic (implantable) cardiac defibrillator: Secondary | ICD-10-CM | POA: Diagnosis not present

## 2014-07-02 DIAGNOSIS — E119 Type 2 diabetes mellitus without complications: Secondary | ICD-10-CM | POA: Diagnosis not present

## 2014-07-02 DIAGNOSIS — IMO0002 Reserved for concepts with insufficient information to code with codable children: Secondary | ICD-10-CM | POA: Diagnosis not present

## 2014-07-02 DIAGNOSIS — R000 Tachycardia, unspecified: Secondary | ICD-10-CM

## 2014-07-02 DIAGNOSIS — I2510 Atherosclerotic heart disease of native coronary artery without angina pectoris: Secondary | ICD-10-CM | POA: Insufficient documentation

## 2014-07-02 DIAGNOSIS — I10 Essential (primary) hypertension: Secondary | ICD-10-CM | POA: Insufficient documentation

## 2014-07-02 DIAGNOSIS — Z794 Long term (current) use of insulin: Secondary | ICD-10-CM | POA: Insufficient documentation

## 2014-07-02 DIAGNOSIS — Z7982 Long term (current) use of aspirin: Secondary | ICD-10-CM | POA: Diagnosis not present

## 2014-07-02 DIAGNOSIS — R0602 Shortness of breath: Secondary | ICD-10-CM | POA: Insufficient documentation

## 2014-07-02 DIAGNOSIS — R109 Unspecified abdominal pain: Secondary | ICD-10-CM | POA: Insufficient documentation

## 2014-07-02 DIAGNOSIS — I5032 Chronic diastolic (congestive) heart failure: Secondary | ICD-10-CM | POA: Diagnosis not present

## 2014-07-02 DIAGNOSIS — Z8673 Personal history of transient ischemic attack (TIA), and cerebral infarction without residual deficits: Secondary | ICD-10-CM | POA: Insufficient documentation

## 2014-07-02 DIAGNOSIS — K219 Gastro-esophageal reflux disease without esophagitis: Secondary | ICD-10-CM | POA: Insufficient documentation

## 2014-07-02 DIAGNOSIS — Z792 Long term (current) use of antibiotics: Secondary | ICD-10-CM | POA: Diagnosis not present

## 2014-07-02 DIAGNOSIS — I252 Old myocardial infarction: Secondary | ICD-10-CM | POA: Insufficient documentation

## 2014-07-02 DIAGNOSIS — C20 Malignant neoplasm of rectum: Secondary | ICD-10-CM | POA: Insufficient documentation

## 2014-07-02 DIAGNOSIS — R7402 Elevation of levels of lactic acid dehydrogenase (LDH): Secondary | ICD-10-CM | POA: Insufficient documentation

## 2014-07-02 DIAGNOSIS — I499 Cardiac arrhythmia, unspecified: Secondary | ICD-10-CM | POA: Diagnosis not present

## 2014-07-02 DIAGNOSIS — Z79899 Other long term (current) drug therapy: Secondary | ICD-10-CM | POA: Insufficient documentation

## 2014-07-02 DIAGNOSIS — R740 Nonspecific elevation of levels of transaminase and lactic acid dehydrogenase [LDH]: Secondary | ICD-10-CM

## 2014-07-02 HISTORY — PX: COLONOSCOPY: SHX5424

## 2014-07-02 HISTORY — PX: ESOPHAGOGASTRODUODENOSCOPY: SHX5428

## 2014-07-02 HISTORY — PX: SAVORY DILATION: SHX5439

## 2014-07-02 HISTORY — PX: MALONEY DILATION: SHX5535

## 2014-07-02 LAB — CBC WITH DIFFERENTIAL/PLATELET
BASOS ABS: 0 10*3/uL (ref 0.0–0.1)
Basophils Relative: 0 % (ref 0–1)
EOS ABS: 0.2 10*3/uL (ref 0.0–0.7)
Eosinophils Relative: 2 % (ref 0–5)
HCT: 40.5 % (ref 39.0–52.0)
Hemoglobin: 14.1 g/dL (ref 13.0–17.0)
Lymphocytes Relative: 16 % (ref 12–46)
Lymphs Abs: 1.3 10*3/uL (ref 0.7–4.0)
MCH: 30.9 pg (ref 26.0–34.0)
MCHC: 34.8 g/dL (ref 30.0–36.0)
MCV: 88.8 fL (ref 78.0–100.0)
Monocytes Absolute: 0.5 10*3/uL (ref 0.1–1.0)
Monocytes Relative: 6 % (ref 3–12)
NEUTROS PCT: 76 % (ref 43–77)
Neutro Abs: 6.5 10*3/uL (ref 1.7–7.7)
PLATELETS: 153 10*3/uL (ref 150–400)
RBC: 4.56 MIL/uL (ref 4.22–5.81)
RDW: 15.3 % (ref 11.5–15.5)
WBC: 8.6 10*3/uL (ref 4.0–10.5)

## 2014-07-02 LAB — BASIC METABOLIC PANEL
ANION GAP: 15 (ref 5–15)
BUN: 27 mg/dL — ABNORMAL HIGH (ref 6–23)
CO2: 21 mEq/L (ref 19–32)
Calcium: 9.3 mg/dL (ref 8.4–10.5)
Chloride: 100 mEq/L (ref 96–112)
Creatinine, Ser: 1.82 mg/dL — ABNORMAL HIGH (ref 0.50–1.35)
GFR calc Af Amer: 45 mL/min — ABNORMAL LOW (ref >90)
GFR, EST NON AFRICAN AMERICAN: 39 mL/min — AB (ref >90)
Glucose, Bld: 222 mg/dL — ABNORMAL HIGH (ref 70–99)
Potassium: 4.3 mEq/L (ref 3.7–5.3)
SODIUM: 136 meq/L — AB (ref 137–147)

## 2014-07-02 LAB — TROPONIN I

## 2014-07-02 LAB — MAGNESIUM: Magnesium: 1.9 mg/dL (ref 1.5–2.5)

## 2014-07-02 LAB — GLUCOSE, CAPILLARY: GLUCOSE-CAPILLARY: 212 mg/dL — AB (ref 70–99)

## 2014-07-02 SURGERY — COLONOSCOPY
Anesthesia: Moderate Sedation

## 2014-07-02 MED ORDER — MIDAZOLAM HCL 5 MG/5ML IJ SOLN
INTRAMUSCULAR | Status: AC
  Filled 2014-07-02: qty 10

## 2014-07-02 MED ORDER — LIDOCAINE VISCOUS 2 % MT SOLN
OROMUCOSAL | Status: AC
  Filled 2014-07-02: qty 15

## 2014-07-02 MED ORDER — ONDANSETRON HCL 4 MG/2ML IJ SOLN
INTRAMUSCULAR | Status: AC
  Filled 2014-07-02: qty 2

## 2014-07-02 MED ORDER — MEPERIDINE HCL 100 MG/ML IJ SOLN
INTRAMUSCULAR | Status: AC
  Filled 2014-07-02: qty 2

## 2014-07-02 MED ORDER — SODIUM CHLORIDE 0.9 % IV SOLN
INTRAVENOUS | Status: DC
  Administered 2014-07-02: 1000 mL via INTRAVENOUS

## 2014-07-02 NOTE — Telephone Encounter (Signed)
Routing to [employer]

## 2014-07-02 NOTE — Discharge Instructions (Signed)
Follow up with your heart md as scheduled.  Return sooner if problems.  Contact your gi md for colonolscopy

## 2014-07-02 NOTE — OR Nursing (Signed)
Pt heartrate appears to be in [place];  Per verbal order by Dr Gala Romney; procedure on hold; pt to PACU to have 12 lead EKG done.

## 2014-07-02 NOTE — ED Notes (Signed)
Pt here from endo. Pt was to have a colonoscopy today but due to irregular heart beat, that pt states he has had for a while,pt was brought to the ED for evaluation

## 2014-07-02 NOTE — Telephone Encounter (Signed)
Message copied by Idamae Schuller on Tue Jul 02, 2014  2:17 PM ------      Message from: [name]      Created: Tue Jul 02, 2014 12:34 PM       Cardiac arrhythmia precluded the double today. He needs a another office visit in 3 or 4 weeks to set him up once again ------

## 2014-07-02 NOTE — ED Provider Notes (Signed)
CSN: [id number]     Arrival date & time 07/02/14  7829 History  This chart was scribed for Maudry Diego, MD by Ludger Nutting, ED Scribe. This patient was seen in room Room/bed info not found and the patient's care was started 9:02 AM.    Chief Complaint  Patient presents with  . Irregular Heart Beat    Patient is a 58 y.o. male presenting with palpitations. The history is provided by the patient. No language interpreter was used.  Palpitations Palpitations quality:  Fast Timing:  Constant Progression:  Unchanged Chronicity:  New Associated symptoms: no back pain, no chest pain and no cough   Risk factors: diabetes mellitus, heart disease and hx of atrial fibrillation     HPI Comments: [name] is a 58 y.o. male who presents to the Emergency Department complaining of constant, unchanged palpitations that began while having a colonoscopy just PTA. Patient was about to begin the procedure when staff noted an elevated heart rate. Patient has been sent here for further evaluation. He denies any other symptoms at this time. He reports a history of colon cancer, prostate cancer, DM, HTN, CHF.   Cardiologist Branch in [place]   Past Medical History  Diagnosis Date  . Essential hypertension, benign   . Type 2 diabetes mellitus   . Coronary atherosclerosis of native coronary artery     BMS to LAD 2001 at [facility], PTCA/atherectomy ramus and BMS to LAD 2009  . S/P colonoscopy     Normal via ostomy - September 2009  . Chronic systolic heart failure   . Myocardial infarction, anterior wall     Treated with tPA at [facility] 2000  . GERD (gastroesophageal reflux disease)   . Cardiomyopathy, ischemic     BIV ICD St. Jude, LVEF 23%  . Paroxysmal atrial fibrillation     Amiodarone and Coumadin  . 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 Prostate cancer     s/p seed implants with chemo and radiation  . 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 Transaminitis 12/12/2013   . CHF (congestive heart failure)   . TIA (transient ischemic attack)    Past Surgical History  Procedure Laterality Date  . Internal defibrillator and pacemaker  2010    x2 St. Jude device  . Abdominal and perineal resection of rectum with total mesorectal excision      10/04/2007  . Colonoscopy      05/17/2007. IMPRESSION: Semilunar, apple-core neoplasm low in the rectum (palpable on digital rectal exam) beginning at 5 cm and corkscrewing up 5 cm in length. This was a low rectal lesion consistent with colorectal carcinoma. It was biosied multiple times. The upstream colon all the way to the cecum appeared normal. Recommendations: Followup on path. Surgical Consultation   . Colonoscopy  09/14/2011    Dr. Gala Romney: via colostomy, Single pedunculated benign inflammatory polyp. Due for surveillance Oct 2015  . Colostomy     Family History  Problem Relation Age of Onset  . Colon cancer Mother 43  . Colon cancer Sister 24  . Coronary artery disease Father   . Colon cancer Other     2 cousins, succumbed to illness   History  Substance Use Topics  . Smoking status: Never Smoker   . Smokeless tobacco: Never Used  . Alcohol Use: No     Comment: Former user 45 years ago    Review of Systems  Constitutional: Negative for appetite change  and fatigue.  HENT: Negative for congestion, ear discharge and sinus pressure.   Eyes: Negative for discharge.  Respiratory: Negative for cough.   Cardiovascular: Positive for palpitations. Negative for chest pain.  Gastrointestinal: Negative for abdominal pain and diarrhea.  Genitourinary: Negative for frequency and hematuria.  Musculoskeletal: Negative for back pain.  Skin: Negative for rash.  Neurological: Negative for seizures and headaches.  Psychiatric/Behavioral: Negative for hallucinations.      Allergies  Review of patient's allergies indicates no known allergies.  Home Medications   Prior to Admission medications   Medication Sig  Start Date End Date Taking? Authorizing Provider  amiodarone (PACERONE) 200 MG tablet Take 1 tablet (200 mg total) by mouth daily. 05/23/14   Maryann Mikhail, DO  apixaban (ELIQUIS) 5 MG TABS tablet Take 5 mg by mouth 2 (two) times daily.    Historical Provider, MD  aspirin 81 MG EC tablet Take 81 mg by mouth at bedtime.     Historical Provider, MD  citalopram (CELEXA) 20 MG tablet Take 1 tablet (20 mg total) by mouth daily. 05/13/14   Lorretta Harp, MD  co-enzyme Q-10 50 MG capsule Take 50 mg by mouth every morning.     Historical Provider, MD  digoxin (LANOXIN) 0.125 MG tablet Take 1 tablet (0.125 mg total) by mouth every other day. 05/17/14   Lorretta Harp, MD  fluticasone (FLONASE) 50 MCG/ACT nasal spray Place 1 spray into both nostrils daily as needed for allergies.  11/21/13   Historical Provider, MD  furosemide (LASIX) 20 MG tablet Take 1 tablet (20 mg total) by mouth daily. 05/25/14   Maryann Mikhail, DO  metFORMIN (GLUCOPHAGE) 500 MG tablet Take 500 mg by mouth 2 (two) times daily with a meal.    Historical Provider, MD  metoprolol succinate (TOPROL-XL) 50 MG 24 hr tablet Take 1 tablet (50 mg total) by mouth daily. Take with or immediately following a meal. 05/23/14   Maryann Mikhail, DO  niacin (NIASPAN) 1000 MG CR tablet Take 1,000 mg by mouth at bedtime.      Historical Provider, MD  nitroGLYCERIN (NITROLINGUAL) 0.4 MG/SPRAY spray Place 1 spray under the tongue every 5 (five) minutes as needed. angina    Historical Provider, MD  Omega-3 Fatty Acids (FISH OIL) 1200 MG CAPS Take 1,200 mg by mouth 2 (two) times daily.      Historical Provider, MD  pantoprazole (PROTONIX) 40 MG tablet Take 1 tablet (40 mg total) by mouth daily. 11/20/13   Lorretta Harp, MD  polyethylene glycol-electrolytes (TRILYTE) 420 G solution Take 4,000 mLs by mouth as directed. 06/06/14   Daneil Dolin, MD  ranolazine (RANEXA) 1000 MG SR tablet Take 1 tablet (1,000 mg total) by mouth 2 (two) times daily. 05/13/14    Arnoldo Lenis, MD  rosuvastatin (CRESTOR) 40 MG tablet Take 40 mg by mouth at bedtime. 01/18/14   Arnoldo Lenis, MD   BP 101/67  Pulse 96  Temp(Src) 98.2 F (36.8 C) (Oral)  Resp 18  Ht 5\' 10"  (1.778 m)  Wt 210 lb (95.255 kg)  BMI 30.13 kg/m2  SpO2 100% Physical Exam  Nursing note and vitals reviewed. Constitutional: He is oriented to person, place, and time. He appears well-developed.  HENT:  Head: Normocephalic.  Eyes: Conjunctivae and EOM are normal. No scleral icterus.  Neck: Neck supple. No thyromegaly present.  Cardiovascular: Normal rate and regular rhythm.  Exam reveals no gallop and no friction rub.   No murmur heard. Pulmonary/Chest:  No stridor. He has no wheezes. He has no rales. He exhibits no tenderness.  Abdominal: He exhibits no distension. There is no tenderness. There is no rebound.  Musculoskeletal: Normal range of motion. He exhibits no edema.  Lymphadenopathy:    He has no cervical adenopathy.  Neurological: He is oriented to person, place, and time. He exhibits normal muscle tone. Coordination normal.  Skin: No rash noted. No erythema.  Psychiatric: He has a normal mood and affect. His behavior is normal.    ED Course  Procedures (including critical care time)  DIAGNOSTIC STUDIES: Oxygen Saturation is 100% on 3L/min, normal by my interpretation.    COORDINATION OF CARE: 9:10 AM Discussed treatment plan with pt at bedside and pt agreed to plan.   Labs Review Labs Reviewed  TROPONIN I    Imaging Review Dg Chest Portable 1 View  07/02/2014   CLINICAL DATA:  Irregular heart rate.  EXAM: PORTABLE CHEST - 1 VIEW  COMPARISON:  Chest x-ray from abdominal series of May 19, 2014  FINDINGS: The lungs are well-expanded and clear. There is a permanent pacemaker defibrillator in place. The cardiac silhouette is top-normal in size. The pulmonary vascularity is normal. There is no pleural effusion. The bony thorax is unremarkable.  IMPRESSION: There is no  evidence of CHF nor other acute cardiopulmonary abnormality.   Electronically Signed   By: David  Martinique   On: 07/02/2014 08:50     EKG Interpretation None      MDM   Final diagnoses:  None    Spoke with cardiology about the vtach at the endoscopy suite.  Cardiology felt pt could be discharged and follow up. The chart was scribed for me under my direct supervision.  I personally performed the history, physical, and medical decision making and all procedures in the evaluation of this patient.Maudry Diego, MD 07/02/14 1100

## 2014-07-02 NOTE — H&P (Signed)
  Patient prepared for EGD and colonoscopy. Wide-complex tachycardia with a rate of 130 noted. Drop in systolic blood pressure in the low 90 range. Defibrillator did not discharge. Patient relates he did not take his Metoprolol this morning . This was just prior to timeout. No other symptoms. The rhythm reverted back to narrow complex rhythm in the 90s spontaneously. Procedure put on hold. Will get EKG serum magnesium CBC and BMET, Will consult cardiology.

## 2014-07-02 NOTE — Interval H&P Note (Signed)
History and Physical Interval Note:  07/02/2014 7:35 AM  [name]  has presented today for surgery, with the diagnosis of RECTAL CANCER, GERD, TRANSAMINITIS  The various methods of treatment have been discussed with the patient and family. After consideration of risks, benefits and other options for treatment, the patient has consented to  Procedure(s) with comments: COLONOSCOPY (N/A) - 7:30 / COLONOSCOPY THRU COLOSTOMY ESOPHAGOGASTRODUODENOSCOPY (EGD) (N/A) - 7:30 SAVORY DILATION (N/A) - 7:30 MALONEY DILATION (N/A) - 7:30 as a surgical intervention .  The patient's history has been reviewed, patient examined, no change in status, stable for surgery.  I have reviewed the patient's chart and labs.  Questions were answered to the patient's satisfaction.     Toniann Dickerson  No change. EGD with possible esophageal dilation and colonoscopy through colostomy per plan. Anticoagulation held x2 days.  The risks, benefits, limitations, alternatives and imponderables have been reviewed with the patient. Questions have been answered. All parties are agreeable.

## 2014-07-02 NOTE — H&P (View-Only) (Signed)
Referring Provider: Redmond School, MD Primary Care Physician:  Glo Herring., MD Primary GI: Dr. Gala Romney  Chief Complaint  Patient presents with  . Advice Only    needs egd with RMR    HPI:   [name] presents today in hospital follow-up after admission for SOB, abdominal pain, and no ostomy output for several days. ACS ruled out with negative troponins. Went into aflutter with questionable SVT, with cardiology consulted at that time. He was discharged 6/18 in good condition.Moderate stool burden noted on CT without evidence of bowel obstruction. Clinically improved from a GI standpoint during admission. Noted to have distal esophageal thickening on CT. Past history significant for stage III rectal carcinoma, diagnosed in 2008, s/p APR/chemotherapy radiation therapy. Last colonoscopy in 2012 with single pedunculated benign inflammatory polyp. Due for surveillance this year. Presents today to discuss EGD to rule out occult process due to esophageal thickening incidentally noted on CT.  BM every one to 2 days. No evidence of bleeding. No longer eating cheese popcorn. No abdominal pain. Occasional nausea for a day to a week, then goes a month without it. Will vomit just once in the morning during these episodes.Usually associated with vertigo. Eating grapes can be difficult if does not chew well. Otherwise no solid food dysphagia. Denies reflux symptoms on Protonix daily. Chronic PPI. On Eliquis for afib.   History of mildly elevated LFTs while on statin in April 2015. Known fatty liver. [place] of abdomen with possible gallbladder polyp. Needs surveillance in Oct 2015.     Past Medical History  Diagnosis Date  . Essential hypertension, benign   . Type 2 diabetes mellitus   . Coronary atherosclerosis of native coronary artery     BMS to LAD 2001 at [facility], PTCA/atherectomy ramus and BMS to LAD 2009  . S/P colonoscopy     Normal via ostomy - September 2009  . Chronic systolic  heart failure   . Myocardial infarction, anterior wall     Treated with tPA at [facility] 2000  . GERD (gastroesophageal reflux disease)   . Cardiomyopathy, ischemic     BIV ICD St. Jude, LVEF 23%  . Paroxysmal atrial fibrillation     Amiodarone and Coumadin  . 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 Prostate cancer     s/p seed implants with chemo and radiation  . 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 Transaminitis 12/12/2013  . CHF (congestive heart failure)   . TIA (transient ischemic attack)     Past Surgical History  Procedure Laterality Date  . Internal defibrillator and pacemaker  2010    x2 St. Jude device  . Abdominal and perineal resection of rectum with total mesorectal excision      10/04/2007  . Colonoscopy      05/17/2007. IMPRESSION: Semilunar, apple-core neoplasm low in the rectum (palpable on digital rectal exam) beginning at 5 cm and corkscrewing up 5 cm in length. This was a low rectal lesion consistent with colorectal carcinoma. It was biosied multiple times. The upstream colon all the way to the cecum appeared normal. Recommendations: Followup on path. Surgical Consultation   . Colonoscopy  09/14/2011    Dr. Gala Romney: via colostomy, Single pedunculated benign inflammatory polyp. Due for surveillance Oct 2015  . Colostomy      Current Outpatient Prescriptions  Medication Sig Dispense Refill  . amiodarone (PACERONE) 200 MG tablet Take 1 tablet (200 mg total) by mouth daily.  30 tablet  0  . apixaban (ELIQUIS) 5 MG TABS tablet Take 5 mg by mouth 2 (two) times daily.      Marland Kitchen aspirin 81 MG EC tablet Take 81 mg by mouth at bedtime.       . citalopram (CELEXA) 20 MG tablet Take 1 tablet (20 mg total) by mouth daily.  30 tablet  1  . clindamycin (CLEOCIN) 300 MG capsule Take 1 capsule (300 mg total) by mouth 3 (three) times daily.  30 capsule  0  . co-enzyme Q-10 50 MG capsule Take 50 mg by mouth every morning.       . digoxin (LANOXIN) 0.125 MG  tablet Take 1 tablet (0.125 mg total) by mouth every other day.  30 tablet  6  . fluticasone (FLONASE) 50 MCG/ACT nasal spray Place 1 spray into both nostrils daily as needed for allergies.       . furosemide (LASIX) 20 MG tablet Take 1 tablet (20 mg total) by mouth daily.  30 tablet  0  . insulin aspart (NOVOLOG) 100 UNIT/ML injection Inject 12-14 Units into the skin 3 (three) times daily as needed for high blood sugar. 14 units at breakfast and 12 units a lunch and 12 units at supper per sliding scale       . insulin glargine (LANTUS) 100 UNIT/ML injection Inject 24 Units into the skin at bedtime.       . meclizine (ANTIVERT) 25 MG tablet Take 1 tablet (25 mg total) by mouth 3 (three) times daily as needed for dizziness.  30 tablet  0  . metFORMIN (GLUCOPHAGE) 500 MG tablet Take 500 mg by mouth 2 (two) times daily with a meal.      . metoprolol succinate (TOPROL-XL) 50 MG 24 hr tablet Take 1 tablet (50 mg total) by mouth daily. Take with or immediately following a meal.  30 tablet  0  . niacin (NIASPAN) 1000 MG CR tablet Take 1,000 mg by mouth at bedtime.        . nitroGLYCERIN (NITROLINGUAL) 0.4 MG/SPRAY spray Place 1 spray under the tongue every 5 (five) minutes as needed. angina      . Omega-3 Fatty Acids (FISH OIL) 1200 MG CAPS Take 1,200 mg by mouth 2 (two) times daily.        . pantoprazole (PROTONIX) 40 MG tablet Take 1 tablet (40 mg total) by mouth daily.  30 tablet  6  . ranolazine (RANEXA) 1000 MG SR tablet Take 1 tablet (1,000 mg total) by mouth 2 (two) times daily.  60 tablet  9  . rosuvastatin (CRESTOR) 40 MG tablet Take 40 mg by mouth at bedtime.       No current facility-administered medications for this visit.    Allergies as of 06/05/2014  . (No Known Allergies)    Family History  Problem Relation Age of Onset  . Colon cancer Mother 51  . Colon cancer Sister 61  . Coronary artery disease Father   . Colon cancer Other     2 cousins, succumbed to illness    History    Social History  . Marital Status: Married    Spouse Name: N/A    Number of Children: 1  . Years of Education: N/A   Occupational History  .     Social History Main Topics  . Smoking status: Never Smoker   . Smokeless tobacco: Never Used  . Alcohol Use: No     Comment: Former user 45 years ago  .  Drug Use: No  . Sexual Activity: No   Other Topics Concern  . None   Social History Narrative  . None    Review of Systems: Gen: Denies fever, chills, anorexia. Denies fatigue, weakness, weight loss.  CV: Denies chest pain, palpitations, syncope, peripheral edema, and claudication. Resp: Denies dyspnea at rest, cough, wheezing, coughing up blood, and pleurisy.  GI: see HPI Derm: Denies rash, itching, dry skin Psych: Denies depression, anxiety, memory loss, confusion. No homicidal or suicidal ideation.  Heme: Denies bruising, bleeding, and enlarged lymph nodes.  Physical Exam: BP 90/62  Pulse 77  Temp(Src) 97 F (36.1 C) (Oral)  Ht 5\' 10"  (1.778 m)  Wt 215 lb 12.8 oz (97.886 kg)  BMI 30.96 kg/m2 General:   Alert and oriented. No distress noted. Pleasant and cooperative.  Head:  Normocephalic and atraumatic. Eyes:  Conjuctiva clear without scleral icterus. Mouth:  Oral mucosa pink and moist. Good dentition. No lesions. Heart:  S1, S2 present with irregularly irregular rhythm Abdomen:  +BS, soft, non-tender and non-distended. No rebound or guarding. LLQ ostomy bag intact.  Msk:  Symmetrical without gross deformities. Normal posture. Extremities:  Without edema. Neurologic:  Alert and  oriented x4;  grossly normal neurologically. Skin:  Intact without significant lesions or rashes. Psych:  Alert and cooperative. Normal mood and affect.  Lab Results  Component Value Date   WBC 9.3 05/23/2014   HGB 12.2* 05/23/2014   HCT 36.0* 05/23/2014   MCV 84.3 05/23/2014   PLT 161 05/23/2014   Lab Results  Component Value Date   ALT 31 05/22/2014   AST 48* 05/22/2014   ALKPHOS 55  05/22/2014   BILITOT 1.1 05/22/2014   CT June 2015 during hospitalization: IMPRESSION:  1. Negative for small bowel obstruction.  2. Cholelithiasis. No CT evidence of cholecystitis.  3. Abdominoperineal resection. Uncomplicated left lower quadrant end  colostomy. Moderate stool burden.  4. Mild thickening of the distal esophagus which can be associated  with gastroesophageal reflux.

## 2014-07-02 NOTE — Discharge Instructions (Signed)
Follow up with your cardiologist as planned.  Follow up sooner if problems.  Call your gi md for a new colonoscopy

## 2014-07-03 NOTE — Telephone Encounter (Signed)
Pt has been scheduled in the next available. OV on 9/1 at 2 with AS.

## 2014-07-04 ENCOUNTER — Encounter: Payer: Self-pay | Admitting: Neurology

## 2014-07-05 ENCOUNTER — Encounter (HOSPITAL_COMMUNITY): Payer: Self-pay | Admitting: Internal Medicine

## 2014-07-09 ENCOUNTER — Telehealth: Payer: Self-pay | Admitting: Internal Medicine

## 2014-07-09 NOTE — Telephone Encounter (Signed)
Pt is on AUG recall to repeat U/S in 6 months and follow up OV. OV has been made for 08/06/14

## 2014-07-09 NOTE — Telephone Encounter (Signed)
Yes. [place] needed. CT not able to always pick up gallbladder issues such as stones, polyps, etc.

## 2014-07-09 NOTE — Telephone Encounter (Signed)
Patient has an U/S on 02/21/14 and CT abd/pel 05/2014 does he need another U/S please advise?

## 2014-07-10 ENCOUNTER — Encounter: Payer: Self-pay | Admitting: Gastroenterology

## 2014-07-10 NOTE — Telephone Encounter (Signed)
Letter has been mailed to the patient 

## 2014-07-11 ENCOUNTER — Other Ambulatory Visit: Payer: Self-pay | Admitting: Cardiovascular Disease

## 2014-07-16 NOTE — Progress Notes (Signed)
Quick Note:  I reviewed the patient's CPAP compliance data from 06/05/2014 to 07/04/2014, which is a total of 30 days, during which time the patient used CPAP only on 14 days. The average usage therefore was low at 1 hour and 42 minutes. Percent used days greater than 4 hours was only 23%, indicating poor compliance. Residual AHI was high at 12.5 per hour and leak was acceptable. With better compliance his AHI is expected to go down. He does not have an appointment pending for me. We will get in touch with the patient regarding his low compliance and encouraged him to use CPAP regularly as well as make a followup appointment with me. Star Age, MD, PhD [facility] ([facility]   ______

## 2014-07-17 ENCOUNTER — Observation Stay (HOSPITAL_COMMUNITY)
Admission: EM | Admit: 2014-07-17 | Discharge: 2014-07-19 | Disposition: A | Discharge status: Home / Self Care | Payer: Medicare Other | Attending: Cardiology | Admitting: Cardiology

## 2014-07-17 ENCOUNTER — Emergency Department (HOSPITAL_COMMUNITY): Payer: Medicare Other

## 2014-07-17 ENCOUNTER — Encounter (HOSPITAL_COMMUNITY): Payer: Self-pay | Admitting: Emergency Medicine

## 2014-07-17 DIAGNOSIS — E785 Hyperlipidemia, unspecified: Secondary | ICD-10-CM | POA: Diagnosis not present

## 2014-07-17 DIAGNOSIS — Z95810 Presence of automatic (implantable) cardiac defibrillator: Secondary | ICD-10-CM | POA: Diagnosis not present

## 2014-07-17 DIAGNOSIS — Z7901 Long term (current) use of anticoagulants: Secondary | ICD-10-CM | POA: Diagnosis not present

## 2014-07-17 DIAGNOSIS — E669 Obesity, unspecified: Secondary | ICD-10-CM | POA: Diagnosis present

## 2014-07-17 DIAGNOSIS — Z8249 Family history of ischemic heart disease and other diseases of the circulatory system: Secondary | ICD-10-CM | POA: Diagnosis not present

## 2014-07-17 DIAGNOSIS — S0990XA Unspecified injury of head, initial encounter: Secondary | ICD-10-CM | POA: Diagnosis not present

## 2014-07-17 DIAGNOSIS — I5022 Chronic systolic (congestive) heart failure: Secondary | ICD-10-CM | POA: Diagnosis present

## 2014-07-17 DIAGNOSIS — Z9861 Coronary angioplasty status: Secondary | ICD-10-CM | POA: Insufficient documentation

## 2014-07-17 DIAGNOSIS — Z8673 Personal history of transient ischemic attack (TIA), and cerebral infarction without residual deficits: Secondary | ICD-10-CM | POA: Diagnosis not present

## 2014-07-17 DIAGNOSIS — Z7982 Long term (current) use of aspirin: Secondary | ICD-10-CM | POA: Diagnosis not present

## 2014-07-17 DIAGNOSIS — Z85048 Personal history of other malignant neoplasm of rectum, rectosigmoid junction, and anus: Secondary | ICD-10-CM | POA: Insufficient documentation

## 2014-07-17 DIAGNOSIS — E875 Hyperkalemia: Secondary | ICD-10-CM | POA: Diagnosis not present

## 2014-07-17 DIAGNOSIS — K219 Gastro-esophageal reflux disease without esophagitis: Secondary | ICD-10-CM | POA: Diagnosis present

## 2014-07-17 DIAGNOSIS — N183 Chronic kidney disease, stage 3 unspecified: Secondary | ICD-10-CM | POA: Insufficient documentation

## 2014-07-17 DIAGNOSIS — E119 Type 2 diabetes mellitus without complications: Secondary | ICD-10-CM | POA: Diagnosis not present

## 2014-07-17 DIAGNOSIS — I10 Essential (primary) hypertension: Secondary | ICD-10-CM | POA: Diagnosis not present

## 2014-07-17 DIAGNOSIS — I951 Orthostatic hypotension: Secondary | ICD-10-CM | POA: Diagnosis not present

## 2014-07-17 DIAGNOSIS — I129 Hypertensive chronic kidney disease with stage 1 through stage 4 chronic kidney disease, or unspecified chronic kidney disease: Secondary | ICD-10-CM | POA: Diagnosis not present

## 2014-07-17 DIAGNOSIS — R55 Syncope and collapse: Secondary | ICD-10-CM | POA: Diagnosis not present

## 2014-07-17 DIAGNOSIS — E861 Hypovolemia: Secondary | ICD-10-CM | POA: Diagnosis present

## 2014-07-17 DIAGNOSIS — I509 Heart failure, unspecified: Secondary | ICD-10-CM | POA: Diagnosis not present

## 2014-07-17 DIAGNOSIS — I2510 Atherosclerotic heart disease of native coronary artery without angina pectoris: Secondary | ICD-10-CM | POA: Diagnosis present

## 2014-07-17 DIAGNOSIS — Z794 Long term (current) use of insulin: Secondary | ICD-10-CM | POA: Insufficient documentation

## 2014-07-17 DIAGNOSIS — I252 Old myocardial infarction: Secondary | ICD-10-CM | POA: Diagnosis not present

## 2014-07-17 DIAGNOSIS — I4891 Unspecified atrial fibrillation: Secondary | ICD-10-CM | POA: Diagnosis not present

## 2014-07-17 DIAGNOSIS — I255 Ischemic cardiomyopathy: Secondary | ICD-10-CM | POA: Diagnosis present

## 2014-07-17 DIAGNOSIS — R42 Dizziness and giddiness: Secondary | ICD-10-CM | POA: Diagnosis present

## 2014-07-17 DIAGNOSIS — G4733 Obstructive sleep apnea (adult) (pediatric): Secondary | ICD-10-CM | POA: Diagnosis present

## 2014-07-17 DIAGNOSIS — N184 Chronic kidney disease, stage 4 (severe): Secondary | ICD-10-CM | POA: Diagnosis present

## 2014-07-17 DIAGNOSIS — IMO0002 Reserved for concepts with insufficient information to code with codable children: Secondary | ICD-10-CM | POA: Insufficient documentation

## 2014-07-17 DIAGNOSIS — Z8546 Personal history of malignant neoplasm of prostate: Secondary | ICD-10-CM | POA: Diagnosis not present

## 2014-07-17 DIAGNOSIS — Z9989 Dependence on other enabling machines and devices: Secondary | ICD-10-CM

## 2014-07-17 DIAGNOSIS — G459 Transient cerebral ischemic attack, unspecified: Secondary | ICD-10-CM | POA: Diagnosis present

## 2014-07-17 DIAGNOSIS — I480 Paroxysmal atrial fibrillation: Secondary | ICD-10-CM

## 2014-07-17 DIAGNOSIS — Z6830 Body mass index (BMI) 30.0-30.9, adult: Secondary | ICD-10-CM | POA: Insufficient documentation

## 2014-07-17 DIAGNOSIS — I2589 Other forms of chronic ischemic heart disease: Secondary | ICD-10-CM | POA: Insufficient documentation

## 2014-07-17 DIAGNOSIS — R404 Transient alteration of awareness: Secondary | ICD-10-CM | POA: Diagnosis not present

## 2014-07-17 HISTORY — DX: Obstructive sleep apnea (adult) (pediatric): G47.33

## 2014-07-17 HISTORY — DX: Dizziness and giddiness: R42

## 2014-07-17 HISTORY — DX: Dependence on other enabling machines and devices: Z99.89

## 2014-07-17 HISTORY — DX: Hyperlipidemia, unspecified: E78.5

## 2014-07-17 HISTORY — DX: Orthostatic hypotension: I95.1

## 2014-07-17 LAB — CBC WITH DIFFERENTIAL/PLATELET
BASOS ABS: 0 10*3/uL (ref 0.0–0.1)
BASOS PCT: 0 % (ref 0–1)
Eosinophils Absolute: 0.4 10*3/uL (ref 0.0–0.7)
Eosinophils Relative: 5 % (ref 0–5)
HEMATOCRIT: 38.8 % — AB (ref 39.0–52.0)
Hemoglobin: 13.3 g/dL (ref 13.0–17.0)
Lymphocytes Relative: 15 % (ref 12–46)
Lymphs Abs: 1.2 10*3/uL (ref 0.7–4.0)
MCH: 31.3 pg (ref 26.0–34.0)
MCHC: 34.3 g/dL (ref 30.0–36.0)
MCV: 91.3 fL (ref 78.0–100.0)
MONO ABS: 0.6 10*3/uL (ref 0.1–1.0)
Monocytes Relative: 7 % (ref 3–12)
NEUTROS ABS: 5.8 10*3/uL (ref 1.7–7.7)
NEUTROS PCT: 73 % (ref 43–77)
PLATELETS: 120 10*3/uL — AB (ref 150–400)
RBC: 4.25 MIL/uL (ref 4.22–5.81)
RDW: 15.6 % — AB (ref 11.5–15.5)
WBC: 8 10*3/uL (ref 4.0–10.5)

## 2014-07-17 LAB — BASIC METABOLIC PANEL
ANION GAP: 14 (ref 5–15)
BUN: 38 mg/dL — ABNORMAL HIGH (ref 6–23)
CALCIUM: 9.8 mg/dL (ref 8.4–10.5)
CO2: 25 mEq/L (ref 19–32)
CREATININE: 1.85 mg/dL — AB (ref 0.50–1.35)
Chloride: 96 mEq/L (ref 96–112)
GFR calc Af Amer: 44 mL/min — ABNORMAL LOW (ref >90)
GFR calc non Af Amer: 38 mL/min — ABNORMAL LOW (ref >90)
Glucose, Bld: 205 mg/dL — ABNORMAL HIGH (ref 70–99)
Potassium: 5.6 mEq/L — ABNORMAL HIGH (ref 3.7–5.3)
SODIUM: 135 meq/L — AB (ref 137–147)

## 2014-07-17 MED ORDER — PANTOPRAZOLE SODIUM 40 MG PO TBEC
40.0000 mg | DELAYED_RELEASE_TABLET | Freq: Every day | ORAL | Status: DC
  Administered 2014-07-17 – 2014-07-19 (×3): 40 mg via ORAL
  Filled 2014-07-17 (×3): qty 1

## 2014-07-17 MED ORDER — ATORVASTATIN CALCIUM 80 MG PO TABS
80.0000 mg | ORAL_TABLET | Freq: Every day | ORAL | Status: DC
  Administered 2014-07-18: 80 mg via ORAL
  Filled 2014-07-17 (×2): qty 1

## 2014-07-17 MED ORDER — ASPIRIN EC 81 MG PO TBEC
81.0000 mg | DELAYED_RELEASE_TABLET | Freq: Every day | ORAL | Status: DC
  Administered 2014-07-17 – 2014-07-18 (×2): 81 mg via ORAL
  Filled 2014-07-17 (×3): qty 1

## 2014-07-17 MED ORDER — CITALOPRAM HYDROBROMIDE 20 MG PO TABS
20.0000 mg | ORAL_TABLET | Freq: Every day | ORAL | Status: DC
  Administered 2014-07-17 – 2014-07-19 (×3): 20 mg via ORAL
  Filled 2014-07-17 (×3): qty 1

## 2014-07-17 MED ORDER — APIXABAN 5 MG PO TABS
5.0000 mg | ORAL_TABLET | Freq: Two times a day (BID) | ORAL | Status: DC
  Administered 2014-07-17 – 2014-07-19 (×4): 5 mg via ORAL
  Filled 2014-07-17 (×5): qty 1

## 2014-07-17 MED ORDER — NIACIN ER (ANTIHYPERLIPIDEMIC) 500 MG PO TBCR
1000.0000 mg | EXTENDED_RELEASE_TABLET | Freq: Every day | ORAL | Status: DC
  Administered 2014-07-17 – 2014-07-18 (×2): 1000 mg via ORAL
  Filled 2014-07-17 (×3): qty 2

## 2014-07-17 MED ORDER — OMEGA-3-ACID ETHYL ESTERS 1 G PO CAPS
1.0000 g | ORAL_CAPSULE | Freq: Two times a day (BID) | ORAL | Status: DC
  Administered 2014-07-17 – 2014-07-19 (×4): 1 g via ORAL
  Filled 2014-07-17 (×5): qty 1

## 2014-07-17 MED ORDER — RANOLAZINE ER 500 MG PO TB12
1000.0000 mg | ORAL_TABLET | Freq: Two times a day (BID) | ORAL | Status: DC
  Administered 2014-07-17 – 2014-07-19 (×4): 1000 mg via ORAL
  Filled 2014-07-17 (×5): qty 2

## 2014-07-17 MED ORDER — DIGOXIN 125 MCG PO TABS
0.1250 mg | ORAL_TABLET | ORAL | Status: DC
  Administered 2014-07-17 – 2014-07-19 (×2): 0.125 mg via ORAL
  Filled 2014-07-17 (×2): qty 1

## 2014-07-17 MED ORDER — INSULIN ASPART 100 UNIT/ML ~~LOC~~ SOLN
0.0000 [IU] | Freq: Three times a day (TID) | SUBCUTANEOUS | Status: DC
  Administered 2014-07-18: 8 [IU] via SUBCUTANEOUS
  Administered 2014-07-18: 3 [IU] via SUBCUTANEOUS
  Administered 2014-07-18: 5 [IU] via SUBCUTANEOUS
  Administered 2014-07-19: 3 [IU] via SUBCUTANEOUS

## 2014-07-17 MED ORDER — SODIUM CHLORIDE 0.9 % IV BOLUS (SEPSIS)
1000.0000 mL | Freq: Once | INTRAVENOUS | Status: AC
  Administered 2014-07-17: 1000 mL via INTRAVENOUS

## 2014-07-17 MED ORDER — AMIODARONE HCL 200 MG PO TABS
200.0000 mg | ORAL_TABLET | Freq: Every day | ORAL | Status: DC
  Administered 2014-07-17 – 2014-07-19 (×3): 200 mg via ORAL
  Filled 2014-07-17 (×3): qty 1

## 2014-07-17 MED ORDER — DIAZEPAM 5 MG/ML IJ SOLN
2.5000 mg | Freq: Once | INTRAMUSCULAR | Status: AC
  Administered 2014-07-17: 2.5 mg via INTRAVENOUS
  Filled 2014-07-17: qty 2

## 2014-07-17 MED ORDER — METOPROLOL SUCCINATE ER 50 MG PO TB24
50.0000 mg | ORAL_TABLET | Freq: Two times a day (BID) | ORAL | Status: DC
  Administered 2014-07-18 (×2): 50 mg via ORAL
  Filled 2014-07-17 (×6): qty 1

## 2014-07-17 NOTE — ED Notes (Signed)
Currently waiting for result of the interrogation of patient's St Jude device.

## 2014-07-17 NOTE — ED Provider Notes (Signed)
CSN: [id number]     Arrival date & time 07/17/14  1020 History   First MD Initiated Contact with Patient 07/17/14 1022     Chief Complaint  Patient presents with  . Fall  . Dizziness     (Consider location/radiation/quality/duration/timing/severity/associated sxs/prior Treatment) Patient is a 58 y.o. male presenting with dizziness. The history is provided by the patient.  Dizziness Quality:  Room spinning Severity:  Moderate Onset quality:  Sudden Duration:  12 months Timing:  Constant Progression:  Unchanged Chronicity:  New Context: head movement   Associated symptoms: no chest pain, no diarrhea, no headaches, no palpitations, no shortness of breath and no vomiting     58 yo M with a chief complaint of room spinning. Patient states is going on for at least year. Patient has seen ENT neurology and cardiology for this. Patient states they have not found an etiology for this yet. Patient does take meclizine which he thinks maybe helps for this. He states that these happen as spell was. He states that he will have symptomatic days for a couple months and then will also have a spell which lasted anywhere from a day to a week. During the patient feels like the world is spinning around and feels unsteady on his feet. Patient fell yesterday and had a small skin tear to his left forearm. Patient denies any head injury. Patient with history of colon cancer and  Past Medical History  Diagnosis Date  . Essential hypertension, benign   . Type 2 diabetes mellitus   . Coronary atherosclerosis of native coronary artery     BMS to LAD 2001 at [facility], PTCA/atherectomy ramus and BMS to LAD 2009  . S/P colonoscopy     Normal via ostomy - September 2009  . Chronic systolic heart failure   . Myocardial infarction, anterior wall     Treated with tPA at [facility] 2000  . GERD (gastroesophageal reflux disease)   . Cardiomyopathy, ischemic     BIV ICD St. Jude, LVEF 23%  . Paroxysmal atrial fibrillation     Amiodarone and Coumadin  . 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 Prostate cancer     s/p seed implants with chemo and radiation  . 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 Transaminitis 12/12/2013  . CHF (congestive heart failure)   . TIA (transient ischemic attack)    Past Surgical History  Procedure Laterality Date  . Internal defibrillator and pacemaker  2010    x2 St. Jude device  . Abdominal and perineal resection of rectum with total mesorectal excision      10/04/2007  . Colonoscopy      05/17/2007. IMPRESSION: Semilunar, apple-core neoplasm low in the rectum (palpable on digital rectal exam) beginning at 5 cm and corkscrewing up 5 cm in length. This was a low rectal lesion consistent with colorectal carcinoma. It was biosied multiple times. The upstream colon all the way to the cecum appeared normal. Recommendations: Followup on path. Surgical Consultation   . Colonoscopy  09/14/2011    Dr. Gala Romney: via colostomy, Single pedunculated benign inflammatory polyp. Due for surveillance Oct 2015  . Colostomy    . Colonoscopy N/A 07/02/2014    Procedure: COLONOSCOPY;  Surgeon: Daneil Dolin, MD;  Location: [facility];  Service: Endoscopy;  Laterality: N/A;  7:30 / COLONOSCOPY THRU COLOSTOMY  . Esophagogastroduodenoscopy N/A 07/02/2014    Procedure: ESOPHAGOGASTRODUODENOSCOPY (EGD);  Surgeon: Daneil Dolin, MD;  Location: [facility];  Service: Endoscopy;  Laterality: N/A;  7:30  . Savory dilation N/A 07/02/2014    Procedure: SAVORY DILATION;  Surgeon: Daneil Dolin, MD;  Location: [facility];  Service: Endoscopy;  Laterality: N/A;  7:30  . Maloney dilation N/A 07/02/2014    Procedure: Venia Minks DILATION;  Surgeon: Daneil Dolin, MD;  Location: [facility];  Service: Endoscopy;  Laterality: N/A;  7:30   Family History  Problem Relation Age of Onset  . Colon cancer Mother 57  . Colon cancer Sister 52  . Coronary artery disease Father   . Colon cancer  Other     2 cousins, succumbed to illness   History  Substance Use Topics  . Smoking status: Never Smoker   . Smokeless tobacco: Never Used  . Alcohol Use: No     Comment: Former user 45 years ago    Review of Systems  Constitutional: Negative for fever and chills.  HENT: Negative for congestion and facial swelling.   Eyes: Negative for discharge and visual disturbance.  Respiratory: Negative for shortness of breath.   Cardiovascular: Negative for chest pain and palpitations.  Gastrointestinal: Negative for vomiting, abdominal pain and diarrhea.  Musculoskeletal: Negative for arthralgias and myalgias.  Skin: Negative for color change and rash.  Neurological: Positive for dizziness. Negative for tremors, syncope and headaches.  Psychiatric/Behavioral: Negative for confusion and dysphoric mood.      Allergies  Review of patient's allergies indicates no known allergies.  Home Medications   Prior to Admission medications   Medication Sig Start Date End Date Taking? Authorizing Provider  amiodarone (PACERONE) 200 MG tablet Take 1 tablet (200 mg total) by mouth daily. 05/23/14  Yes Maryann Mikhail, DO  apixaban (ELIQUIS) 5 MG TABS tablet Take 5 mg by mouth 2 (two) times daily.   Yes Historical Provider, MD  aspirin 81 MG EC tablet Take 81 mg by mouth at bedtime.    Yes Historical Provider, MD  citalopram (CELEXA) 20 MG tablet Take 1 tablet (20 mg total) by mouth daily. 05/13/14  Yes Lorretta Harp, MD  co-enzyme Q-10 50 MG capsule Take 50 mg by mouth every morning.    Yes Historical Provider, MD  digoxin (LANOXIN) 0.125 MG tablet Take 1 tablet (0.125 mg total) by mouth every other day. 05/17/14  Yes Lorretta Harp, MD  enalapril (VASOTEC) 10 MG tablet Take 10 mg by mouth daily.   Yes Historical Provider, MD  fluticasone (FLONASE) 50 MCG/ACT nasal spray Place 1 spray into both nostrils daily as needed for allergies.  11/21/13  Yes Historical Provider, MD  furosemide (LASIX) 20 MG  tablet Take 1 tablet (20 mg total) by mouth daily. 05/25/14  Yes Maryann Mikhail, DO  metFORMIN (GLUCOPHAGE) 500 MG tablet Take 500 mg by mouth 2 (two) times daily with a meal.   Yes Historical Provider, MD  metoprolol succinate (TOPROL-XL) 100 MG 24 hr tablet Take 50-100 mg by mouth 2 (two) times daily. Take 100mg  in the morning and 50mg  in the evening.  Take with or immediately following a meal.   Yes Historical Provider, MD  niacin (NIASPAN) 1000 MG CR tablet Take 1,000 mg by mouth at bedtime.     Yes Historical Provider, MD  nitroGLYCERIN (NITROLINGUAL) 0.4 MG/SPRAY spray Place 1 spray under the tongue every 5 (five) minutes as needed. angina   Yes Historical Provider, MD  Omega-3 Fatty Acids (FISH OIL) 1200 MG CAPS Take 1,200 mg by mouth 2 (two) times daily.  Yes Historical Provider, MD  pantoprazole (PROTONIX) 40 MG tablet Take 1 tablet (40 mg total) by mouth daily. 11/20/13  Yes Lorretta Harp, MD  ranolazine (RANEXA) 1000 MG SR tablet Take 1 tablet (1,000 mg total) by mouth 2 (two) times daily. 05/13/14  Yes Arnoldo Lenis, MD  rosuvastatin (CRESTOR) 40 MG tablet Take 40 mg by mouth at bedtime. 01/18/14  Yes Arnoldo Lenis, MD   BP 93/60  Pulse 72  Temp(Src) 97.5 F (36.4 C) (Oral)  Resp 14  Ht 5\' 10"  (1.778 m)  Wt 210 lb (95.255 kg)  BMI 30.13 kg/m2  SpO2 100% Physical Exam  Constitutional: He is oriented to person, place, and time. He appears well-developed and well-nourished.  HENT:  Head: Normocephalic and atraumatic.  Eyes: EOM are normal. Pupils are equal, round, and reactive to light.  Neck: Normal range of motion. Neck supple. No JVD present.  Cardiovascular: Normal rate and regular rhythm.  Exam reveals no gallop and no friction rub.   No murmur heard. Pulmonary/Chest: No respiratory distress. He has no wheezes.  Abdominal: He exhibits no distension. There is no rebound and no guarding.  Musculoskeletal: Normal range of motion.  Neurological: He is alert and  oriented to person, place, and time. He has normal strength. No cranial nerve deficit or sensory deficit. Gait abnormal. Coordination normal. GCS eye subscore is 4. GCS verbal subscore is 5. GCS motor subscore is 6. He displays no Babinski's sign on the right side. He displays no Babinski's sign on the left side.  Reflex Scores:      Tricep reflexes are 2+ on the right side and 2+ on the left side.      Bicep reflexes are 2+ on the right side and 2+ on the left side.      Brachioradialis reflexes are 2+ on the right side and 2+ on the left side.      Patellar reflexes are 2+ on the right side and 2+ on the left side.      Achilles reflexes are 2+ on the right side and 2+ on the left side. Unable to perform romberg.  Patient with unsteady gait upon standing, difficulty placing feet. Finger to nose and heel to shin unremarkable.  Skin: No rash noted. No pallor.  Psychiatric: He has a normal mood and affect. His behavior is normal.    ED Course  Procedures (including critical care time) Labs Review Labs Reviewed  CBC WITH DIFFERENTIAL - Abnormal; Notable for the following:    HCT 38.8 (*)    RDW 15.6 (*)    Platelets 120 (*)    All other components within normal limits  BASIC METABOLIC PANEL - Abnormal; Notable for the following:    Sodium 135 (*)    Potassium 5.6 (*)    Glucose, Bld 205 (*)    BUN 38 (*)    Creatinine, Ser 1.85 (*)    GFR calc non Af Amer 38 (*)    GFR calc Af Amer 44 (*)    All other components within normal limits    Imaging Review Ct Head Wo Contrast  07/17/2014   CLINICAL DATA:  Dizziness and fall  EXAM: CT HEAD WITHOUT CONTRAST  TECHNIQUE: Contiguous axial images were obtained from the base of the skull through the vertex without intravenous contrast.  COMPARISON:  CT scan of the brain of February 28, 2014  FINDINGS: The ventricles are normal in size and position. There is no intracranial hemorrhage nor intracranial  mass effect. There is no acute ischemic change.  The cerebellum and brainstem are normal.  The observed paranasal sinuses and mastoid air cells are clear. There is no acute skull fracture.  IMPRESSION: There is no acute intracranial hemorrhage nor other acute intracranial abnormality.   Electronically Signed   By: David  Martinique   On: 07/17/2014 15:15   Dg Chest Portable 1 View  07/17/2014   CLINICAL DATA:  Dizziness. History of myocardial infarction, hypertension and diabetes.  EXAM: PORTABLE CHEST - 1 VIEW  COMPARISON:  07/02/2014 and 05/19/2014 radiographs.  FINDINGS: 1111 hr. The left subclavian pacemaker/AICD leads appear unchanged. The heart size and mediastinal contours are stable. The lungs are clear. There is no pleural effusion or pneumothorax. No acute osseous findings are evident.  IMPRESSION: Stable chest.  No acute cardiopulmonary process.   Electronically Signed   By: Camie Patience M.D.   On: 07/17/2014 12:00     EKG Interpretation None      MDM   Final diagnoses:  None    58 yo M with a chief complaint of vertigo. Patient with unsteady gait on neuro exam. Patient with recurrent episodes of this with prolonged workups. Unable to obtain MRI secondary to pacemaker. Spoke with radiology recommended noncontrast head CT to possibly evaluate for metastasis of his colon cancer.  CT negative, patient found to be in aflutter, without ventricular arrythmia, cards consulted for low EF, orthostasis.   Seen by cards PA, likely admit.  turnover to dr Reather Converse.   Deno Etienne, MD 07/17/14 [phone]

## 2014-07-17 NOTE — ED Notes (Signed)
EMS reported 6 second run of v-tach patient had no complaints denies chest pain or shortness of breath.  EMS gave 400 ml 0.9NS no additional reports of v-tach.

## 2014-07-17 NOTE — ED Notes (Signed)
Seen at another hospital for same symptoms of dizzy lightheaded and multiple falls. Sent here for further evaluation still have dizziness lightheadedness while trying to walk.  [address] one day ago left forearm bleeding controlled with bandage. Denies pain alert answering and following commands appropriate.

## 2014-07-17 NOTE — H&P (Signed)
Cardiologist:  Branch/Adams([place] [facility])  [name] is an 58 y.o. male.   Chief Complaint: Dizziness HPI:    [name] is an 58 y.o. male.  HPI:   [name] is a 58 y.o.male with known history of ischemic cardiomyopathy, CAD, with prior bare-metal stent to the LAD in 2001, repeat bare-metal stent to the LAD in 2009, with most recent LVEF of 15% per echo in 2015, history of St. Jude ICD, hypertension, PAF on Eliquis and amiodarone, hyperlipidemia, rectal carcinoma.  He was admitted on May 19, 2014 with worsening shortness of breath and chest pressure and ruled out for ACS.  Enalapril and isordil and spironolactone due to soft blood pressures.   His last cardiac cath was July 13, 2013 and revealed ischemic cardiomyopathy with a widely patent proximal LAD stent and moderate segmental calcified proximal LAD stenosis. His first diagonal was totally occluded and diffusely diseased.  His ICD was interrogated in June 12 and he was in afib 84% with 78% biventicular pacing.   The patient reports feeling wobbly and dizzy about once a month since Feb and he had the first episode last Sept.  Yesterday it reoccurred and he feel twice then again this morning. It seems to happen right after position change.  He reports some N, V.   Orthostatic BP at 1135hrs- Lying 73/45 BP- Lying Pulse- Lying 70 Pulse- Lying BP- Sitting 68/42.  The patient currently denies  fever, chest pain, shortness of breath, orthopnea, PND, cough, congestion, abdominal pain, hematochezia, melena, lower extremity edema, claudication.   Cath results 07/13/13 ANGIOGRAPHIC RESULTS:  1. Left main; normal  2. LAD; the entire proximal third of the LAD was fluoroscopically calcified. The proximal third had approximately 50-60% segmental stenosis. The stent was widely patent in the proximal third of the LAD with 30-40% in-stent restenosis". There was a moderate size first diagonal branch and the ramus distribution that was  occluded in its proximal portion and filled by collaterals. This was noted to be highly diseased at his last cath in 2009. There was 50-60% segmental stenosis in the middle third and 70% in the distal/apical third. There were 2 small marginal branches arising from the middle third that had 90% ostial stenoses unchanged from prior cath  3. Left circumflex; dominant with 99% long segmental proximal OM1 stenosis in a small to medium-size vessel unchanged from prior cath  4. Right coronary artery; nondominant with 50% mid and 80% distal stenosis unchanged from prior cath  5. Left ventriculography; not performed today to conserve contrast    Medications: Prior to Admission medications   Medication Sig Start Date End Date Taking? Authorizing Provider  amiodarone (PACERONE) 200 MG tablet Take 1 tablet (200 mg total) by mouth daily. 05/23/14  Yes Maryann Mikhail, DO  apixaban (ELIQUIS) 5 MG TABS tablet Take 5 mg by mouth 2 (two) times daily.   Yes Historical Provider, MD  aspirin 81 MG EC tablet Take 81 mg by mouth at bedtime.    Yes Historical Provider, MD  citalopram (CELEXA) 20 MG tablet Take 1 tablet (20 mg total) by mouth daily. 05/13/14  Yes Lorretta Harp, MD  co-enzyme Q-10 50 MG capsule Take 50 mg by mouth every morning.    Yes Historical Provider, MD  digoxin (LANOXIN) 0.125 MG tablet Take 1 tablet (0.125 mg total) by mouth every other day. 05/17/14  Yes Lorretta Harp, MD  enalapril (VASOTEC) 10 MG tablet Take 10 mg by mouth daily.  Yes Historical Provider, MD  fluticasone (FLONASE) 50 MCG/ACT nasal spray Place 1 spray into both nostrils daily as needed for allergies.  11/21/13  Yes Historical Provider, MD  furosemide (LASIX) 20 MG tablet Take 1 tablet (20 mg total) by mouth daily. 05/25/14  Yes Maryann Mikhail, DO  metFORMIN (GLUCOPHAGE) 500 MG tablet Take 500 mg by mouth 2 (two) times daily with a meal.   Yes Historical Provider, MD  metoprolol succinate (TOPROL-XL) 100 MG 24 hr tablet Take  50-100 mg by mouth 2 (two) times daily. Take 126m in the morning and 530min the evening.  Take with or immediately following a meal.   Yes Historical Provider, MD  niacin (NIASPAN) 1000 MG CR tablet Take 1,000 mg by mouth at bedtime.     Yes Historical Provider, MD  nitroGLYCERIN (NITROLINGUAL) 0.4 MG/SPRAY spray Place 1 spray under the tongue every 5 (five) minutes as needed. angina   Yes Historical Provider, MD  Omega-3 Fatty Acids (FISH OIL) 1200 MG CAPS Take 1,200 mg by mouth 2 (two) times daily.     Yes Historical Provider, MD  pantoprazole (PROTONIX) 40 MG tablet Take 1 tablet (40 mg total) by mouth daily. 11/20/13  Yes JoLorretta HarpMD  ranolazine (RANEXA) 1000 MG SR tablet Take 1 tablet (1,000 mg total) by mouth 2 (two) times daily. 05/13/14  Yes JoArnoldo LenisMD  rosuvastatin (CRESTOR) 40 MG tablet Take 40 mg by mouth at bedtime. 01/18/14  Yes JoArnoldo LenisMD     Past Medical History  Diagnosis Date  . Essential hypertension, benign   . Type 2 diabetes mellitus   . Coronary atherosclerosis of native coronary artery     [facility] and BMS to LAD 2009  . S/P colonoscopy     Normal via ostomy - September 2009  . Chronic systolic heart failure   . Myocardial infarction, anterior wall     [facility]  . GERD (gastroesophageal reflux disease)   . Cardiomyopathy, ischemic     BIV ICD St. Jude, LVEF 23%  . Paroxysmal atrial fibrillation     Amiodarone and Coumadin  . 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 Prostate cancer     s/p seed implants with chemo and radiation  . 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 Transaminitis 12/12/2013  . CHF (congestive heart failure)   . TIA (transient ischemic attack)     Past Surgical History  Procedure Laterality Date  . Internal defibrillator and pacemaker  2010    x2 St. Jude device  . Abdominal and perineal resection of rectum with total  mesorectal excision      10/04/2007  . Colonoscopy      05/17/2007. IMPRESSION: Semilunar, apple-core neoplasm low in the rectum (palpable on digital rectal exam) beginning at 5 cm and corkscrewing up 5 cm in length. This was a low rectal lesion consistent with colorectal carcinoma. It was biosied multiple times. The upstream colon all the way to the cecum appeared normal. Recommendations: Followup on path. Surgical Consultation   . Colonoscopy  09/14/2011    Dr. RoGala Romneyvia colostomy, Single pedunculated benign inflammatory polyp. Due for surveillance Oct 2015  . Colostomy    . Colonoscopy N/A 07/02/2014    Procedure: COLONOSCOPY;  Surgeon: RoDaneil DolinMD;  [facility];  Service: Endoscopy;  Laterality: N/A;  7:30 / COLONOSCOPY THRU COLOSTOMY  .  Esophagogastroduodenoscopy N/A 07/02/2014    Procedure: ESOPHAGOGASTRODUODENOSCOPY (EGD);  Surgeon: Daneil Dolin, MD;  Location: [facility];  Service: Endoscopy;  Laterality: N/A;  7:30  . Savory dilation N/A 07/02/2014    Procedure: SAVORY DILATION;  Surgeon: Daneil Dolin, MD;  Location: [facility];  Service: Endoscopy;  Laterality: N/A;  7:30  . Maloney dilation N/A 07/02/2014    Procedure: Venia Minks DILATION;  Surgeon: Daneil Dolin, MD;  Location: [facility];  Service: Endoscopy;  Laterality: N/A;  7:30    Family History  Problem Relation Age of Onset  . Colon cancer Mother 81  . Colon cancer Sister 85  . Coronary artery disease Father   . Colon cancer Other     2 cousins, succumbed to illness   Social History:  reports that he has never smoked. He has never used smokeless tobacco. He reports that he does not drink alcohol or use illicit drugs.  Allergies: No Known Allergies   (Not in a hospital admission)  Results for orders placed during the hospital encounter of 07/17/14 (from the past 48 hour(s))  CBC WITH DIFFERENTIAL     Status: Abnormal   Collection Time    07/17/14 11:01 AM      Result Value Ref Range    WBC 8.0  4.0 - 10.5 K/uL   RBC 4.25  4.22 - 5.81 MIL/uL   Hemoglobin 13.3  13.0 - 17.0 g/dL   HCT 38.8 (*) 39.0 - 52.0 %   MCV 91.3  78.0 - 100.0 fL   MCH 31.3  26.0 - 34.0 pg   MCHC 34.3  30.0 - 36.0 g/dL   RDW 15.6 (*) 11.5 - 15.5 %   Platelets 120 (*) 150 - 400 K/uL   Neutrophils Relative % 73  43 - 77 %   Neutro Abs 5.8  1.7 - 7.7 K/uL   Lymphocytes Relative 15  12 - 46 %   Lymphs Abs 1.2  0.7 - 4.0 K/uL   Monocytes Relative 7  3 - 12 %   Monocytes Absolute 0.6  0.1 - 1.0 K/uL   Eosinophils Relative 5  0 - 5 %   Eosinophils Absolute 0.4  0.0 - 0.7 K/uL   Basophils Relative 0  0 - 1 %   Basophils Absolute 0.0  0.0 - 0.1 K/uL  BASIC METABOLIC PANEL     Status: Abnormal   Collection Time    07/17/14 11:01 AM      Result Value Ref Range   Sodium 135 (*) 137 - 147 mEq/L   Potassium 5.6 (*) 3.7 - 5.3 mEq/L   Chloride 96  96 - 112 mEq/L   CO2 25  19 - 32 mEq/L   Glucose, Bld 205 (*) 70 - 99 mg/dL   BUN 38 (*) 6 - 23 mg/dL   Creatinine, Ser 1.85 (*) 0.50 - 1.35 mg/dL   Calcium 9.8  8.4 - 10.5 mg/dL   GFR calc non Af Amer 38 (*) >90 mL/min   GFR calc Af Amer 44 (*) >90 mL/min   Comment: (NOTE)     The eGFR has been calculated using the CKD EPI equation.     This calculation has not been validated in all clinical situations.     eGFR's persistently <90 mL/min signify possible Chronic Kidney     Disease.   Anion gap 14  5 - 15   Ct Head Wo Contrast  07/17/2014   CLINICAL DATA:  Dizziness and  fall  EXAM: CT HEAD WITHOUT CONTRAST  TECHNIQUE: Contiguous axial images were obtained from the base of the skull through the vertex without intravenous contrast.  COMPARISON:  CT scan of the brain of February 28, 2014  FINDINGS: The ventricles are normal in size and position. There is no intracranial hemorrhage nor intracranial mass effect. There is no acute ischemic change. The cerebellum and brainstem are normal.  The observed paranasal sinuses and mastoid air cells are clear. There is no acute  skull fracture.  IMPRESSION: There is no acute intracranial hemorrhage nor other acute intracranial abnormality.   Electronically Signed   By: David  Martinique   On: 07/17/2014 15:15   Dg Chest Portable 1 View  07/17/2014   CLINICAL DATA:  Dizziness. History of myocardial infarction, hypertension and diabetes.  EXAM: PORTABLE CHEST - 1 VIEW  COMPARISON:  07/02/2014 and 05/19/2014 radiographs.  FINDINGS: 1111 hr. The left subclavian pacemaker/AICD leads appear unchanged. The heart size and mediastinal contours are stable. The lungs are clear. There is no pleural effusion or pneumothorax. No acute osseous findings are evident.  IMPRESSION: Stable chest.  No acute cardiopulmonary process.   Electronically Signed   By: Camie Patience M.D.   On: 07/17/2014 12:00    ROS  Blood pressure 93/60, pulse 72, temperature 97.5 F (36.4 C), temperature source Oral, resp. rate 14, height _0  (1.778 m), weight 210 lb (95.255 kg), SpO2 100.00%. Physical Exam  Physical Exam  Nursing note and vitals reviewed. Constitutional: He is oriented to person, place, and time. He appears well-developed and well-nourished. No distress.  HENT:  Head: Normocephalic and atraumatic.  Mouth/Throat: Oropharynx is clear and moist.  Eyes: EOM are normal. Pupils are equal, round, and reactive to light. No scleral icterus.  Neck: Normal range of motion. Neck supple. No JVD present.  Cardiovascular: Normal rate, regular rhythm, S1 normal and S2 normal.   No murmur heard. Pulses:      Radial pulses are 2+ on the right side, and 2+ on the left side.       Dorsalis pedis pulses are 1+ on the right side, and 2+ on the left side.  No carotid bruit  Respiratory: Effort normal and breath sounds normal. He has no wheezes. He has no rales.  GI: Soft. Bowel sounds are normal. He exhibits no distension. There is no tenderness.  Musculoskeletal: He exhibits no edema.  Lymphadenopathy:    He has no cervical adenopathy.  Neurological: He is  alert and oriented to person, place, and time. He exhibits normal muscle tone.  Skin: Skin is warm and dry.  Psychiatric: He has a normal mood and affect.   Assessment/Plan   Assessment/Plan: Active Problems:   DIABETES MELLITUS   CARDIOMYOPATHY, ISCHEMIC, with BiV ICD, st Jude EF 23%   Long term (current) use of anticoagulants   Chronic systolic heart failure   Hypotension   Hypovolemia   Hyperkalemia  Plan: Current meds:   metoprolol XL 100 QAM and 50QPM, Enlapril 47m digoxin, Lasix,  Amio 200, Eliquis.  Admit to obs.  He received a 10037mof NS in the ER.  KVO fluids.  BP is still low but when I stat him up he dropped from 91sbp to 8659m.   Will hold lasix and enalapril.  Decrease Toprolol to 50/50.    MD to review last two echos.   No signs of acute chf but with rise in SCr and BP, he is likely dry.  HAGTarri FullerA-C 07/17/2014, 2:53  PM   HAGER, [place] 07/17/2014, 3:55 PM   History and all data above reviewed.  Patient examined.  I agree with the findings as above.  The patient has an ischemic cardiomyopathy and is [facility].  He also sees Dr. Harl Bowie.  He has had orthostatic symptoms in the past.  However, he now has had episodes of near syncope multiple times in the past couple of days.  In the ED he was noted to have a significant drop in his BP.  The patient exam reveals COR:RRR  ,  Lungs: Clear  ,  Abd: Positive bowel sounds, no rebound no guarding, Ext No edema  .  All available labs, radiology testing, previous records reviewed. Agree with documented assessment and plan. Presyncope:  This seems to be related to his orthostasis.  He has other indicators of volume contraction as well. He was given a liter of fluid in the ED.   BP is up.  I will reduce the beta blocker to 50 mg bid.  For now I will hold his ACE inhibitor but I would suggest that this be restarted (perhaps with dose adjustment) in the AM.  He should be discharged with compression stockings as I suspect he will  continue to have some amount of orthostasis even after meds are adjusted.  I did review his EF which was said to be 15% in March of this year which was down from 30% last year.  However, both EFs appear to be about 25% without a significant drop.    Jeneen Rinks Halley Kincer  5:23 PM  07/17/2014

## 2014-07-17 NOTE — ED Notes (Signed)
Spoke with CT stated patient is second to come to the scanner.

## 2014-07-18 DIAGNOSIS — E861 Hypovolemia: Secondary | ICD-10-CM

## 2014-07-18 DIAGNOSIS — R55 Syncope and collapse: Secondary | ICD-10-CM | POA: Diagnosis not present

## 2014-07-18 DIAGNOSIS — I5022 Chronic systolic (congestive) heart failure: Secondary | ICD-10-CM | POA: Diagnosis not present

## 2014-07-18 DIAGNOSIS — R42 Dizziness and giddiness: Secondary | ICD-10-CM

## 2014-07-18 DIAGNOSIS — I951 Orthostatic hypotension: Secondary | ICD-10-CM | POA: Diagnosis not present

## 2014-07-18 LAB — BASIC METABOLIC PANEL
Anion gap: 12 (ref 5–15)
BUN: 32 mg/dL — AB (ref 6–23)
CHLORIDE: 101 meq/L (ref 96–112)
CO2: 22 mEq/L (ref 19–32)
Calcium: 9.1 mg/dL (ref 8.4–10.5)
Creatinine, Ser: 1.69 mg/dL — ABNORMAL HIGH (ref 0.50–1.35)
GFR calc non Af Amer: 43 mL/min — ABNORMAL LOW (ref >90)
GFR, EST AFRICAN AMERICAN: 49 mL/min — AB (ref >90)
Glucose, Bld: 181 mg/dL — ABNORMAL HIGH (ref 70–99)
Potassium: 5.2 mEq/L (ref 3.7–5.3)
Sodium: 135 mEq/L — ABNORMAL LOW (ref 137–147)

## 2014-07-18 LAB — GLUCOSE, CAPILLARY
GLUCOSE-CAPILLARY: 218 mg/dL — AB (ref 70–99)
Glucose-Capillary: 169 mg/dL — ABNORMAL HIGH (ref 70–99)
Glucose-Capillary: 275 mg/dL — ABNORMAL HIGH (ref 70–99)
Glucose-Capillary: 179 mg/dL — ABNORMAL HIGH (ref 70–99)

## 2014-07-18 LAB — PRO B NATRIURETIC PEPTIDE: Pro B Natriuretic peptide (BNP): 1116 pg/mL — ABNORMAL HIGH (ref 0–125)

## 2014-07-18 MED ORDER — ENALAPRIL MALEATE 2.5 MG PO TABS
2.5000 mg | ORAL_TABLET | Freq: Every day | ORAL | Status: DC
  Administered 2014-07-18 – 2014-07-19 (×2): 2.5 mg via ORAL
  Filled 2014-07-18 (×2): qty 1

## 2014-07-18 MED ORDER — ADULT MULTIVITAMIN W/MINERALS CH
1.0000 | ORAL_TABLET | Freq: Every day | ORAL | Status: DC
  Administered 2014-07-18 – 2014-07-19 (×2): 1 via ORAL
  Filled 2014-07-18 (×2): qty 1

## 2014-07-18 MED ORDER — GLUCERNA SHAKE PO LIQD
237.0000 mL | ORAL | Status: DC
  Administered 2014-07-18: 237 mL via ORAL

## 2014-07-18 MED ORDER — FUROSEMIDE 20 MG PO TABS
10.0000 mg | ORAL_TABLET | Freq: Every day | ORAL | Status: DC
  Administered 2014-07-19: 10 mg via ORAL
  Filled 2014-07-18: qty 0.5

## 2014-07-18 NOTE — Progress Notes (Signed)
UR Completed Kairie Vangieson Graves-Bigelow, RN,BSN [phone]  

## 2014-07-18 NOTE — Progress Notes (Signed)
Orthostatic bp positive this am. No complaints of dizziness.

## 2014-07-18 NOTE — Progress Notes (Signed)
INITIAL NUTRITION ASSESSMENT  DOCUMENTATION CODES Per approved criteria  -Non-severe (moderate) malnutrition in the context of chronic illness  Pt meets criteria for MODERATE MALNUTRITION in the context of CHRONIC ILLNESS as evidenced by estimated energy intake <75% of estimated energy needs for >/= 1 month and moderate muscle mass loss evidenced in physical exam.  INTERVENTION: Recommend checking Hemoglobin A1c lab and consulting Diabetes Coordinator Provide snacks BID Provide Glucerna Shakes once daily  NUTRITION DIAGNOSIS: Inadequate oral intake related to decreased appetite as evidenced by pt's report of eating 50% less x 1 month and 4% unintentional weight loss.   Goal: Pt to meet >/= 90% of their estimated nutrition needs   Monitor:  PO intake, weight trend, labs  Reason for Assessment: Malnutrition Screening Tool, score of 3  58 y.o. male  Admitting Dx: Hypotension  ASSESSMENT: 58 y.o.male with known history of ischemic cardiomyopathy, CAD, with prior bare-metal stent to the LAD in 2001, repeat bare-metal stent to the LAD in 2009, with most recent LVEF of 15% per echo in 2015, history of St. Jude ICD, hypertension, PAF on Eliquis and amiodarone, hyperlipidemia, rectal carcinoma. The patient reports feeling wobbly and dizzy about once a month since Feb and he had the first episode last Sept. Yesterday it reoccurred and he feel twice then again this morning.  Pt states that for the past month he has had a poor appetite and has been eating much less than he used to. He reports snacking instead of eating meals. Per nursing notes pt consumed 50% of breakfast this morning- pt states this is the amount he has been eating 3  times daily for the past month. He reports that he used to weigh 218 lbs. Pt has lost 4% of his body weight in the past 1-2 months; wt loss not significant for time frame.  Pt states that one year ago he lost his PCP at which time he stopped taking his insulin and  has only been taking Metformin. Last hemoglobin in March 2015 was > 10%. Pt asking to get insulin prescriptions set up prior to discharge.  Labs: low sodium, elevated glucose, elevated BUN, decreased GFR  Nutrition Focused Physical Exam:  Subcutaneous Fat:  Orbital Region: wnl Upper Arm Region: wnl Thoracic and Lumbar Region: NA  Muscle:  Temple Region: wnl Clavicle Bone Region: mild wasting Clavicle and Acromion Bone Region: wnl Scapular Bone Region: NA Dorsal Hand: wnl Patellar Region: moderate to severe wasting Anterior Thigh Region: moderate wasting Posterior Calf Region: mild wasting  Edema: none   Height: Ht Readings from Last 1 Encounters:  07/17/14 5\' 10"  (1.778 m)    Weight: Wt Readings from Last 1 Encounters:  07/18/14 210 lb 15.7 oz (95.7 kg)    Ideal Body Weight: 166 lbs  % Ideal Body Weight: 127%  Wt Readings from Last 10 Encounters:  07/18/14 210 lb 15.7 oz (95.7 kg)  07/02/14 210 lb (95.255 kg)  06/06/14 217 lb (98.431 kg)  06/05/14 215 lb 12.8 oz (97.886 kg)  05/22/14 220 lb 7.4 oz (100 kg)  04/05/14 218 lb (98.884 kg)  03/06/14 219 lb 6.4 oz (99.519 kg)  03/01/14 218 lb 4.1 oz (99 kg)  02/22/14 213 lb 3.2 oz (96.707 kg)  02/12/14 216 lb (97.977 kg)    Usual Body Weight: 218 lb  % Usual Body Weight: 96%  BMI:  Body mass index is 30.27 kg/(m^2).  Estimated Nutritional Needs: Kcal: 2100-2300 Protein: 90-105 grams Fluid: 2.3 L/day  Skin: intact; +1 [name] and  LLE edema per nursing notes  Diet Order: Carb Control  EDUCATION NEEDS: -No education needs identified at this time   Intake/Output Summary (Last 24 hours) at 07/18/14 1026 Last data filed at 07/18/14 0825  Gross per 24 hour  Intake    560 ml  Output   1325 ml  Net   -765 ml    Last BM: 8/12  Labs:   Recent Labs Lab 07/17/14 1101 07/18/14 0555  NA 135* 135*  K 5.6* 5.2  CL 96 101  CO2 25 22  BUN 38* 32*  CREATININE 1.85* 1.69*  CALCIUM 9.8 9.1  GLUCOSE 205*  181*    CBG (last 3)   Recent Labs  07/18/14 0554  GLUCAP 169*    Scheduled Meds: . amiodarone  200 mg Oral Daily  . apixaban  5 mg Oral BID  . aspirin EC  81 mg Oral QHS  . atorvastatin  80 mg Oral q1800  . citalopram  20 mg Oral Daily  . digoxin  0.125 mg Oral QODAY  . enalapril  2.5 mg Oral Daily  . [START ON 07/19/2014] furosemide  10 mg Oral Daily  . insulin aspart  0-15 Units Subcutaneous TID WC  . metoprolol succinate  50 mg Oral BID WC  . niacin  1,000 mg Oral QHS  . omega-3 acid ethyl esters  1 g Oral BID  . pantoprazole  40 mg Oral Daily  . ranolazine  1,000 mg Oral BID    Continuous Infusions:   Past Medical History  Diagnosis Date  . Essential hypertension, benign   . Type 2 diabetes mellitus   . Coronary atherosclerosis of native coronary artery     [facility] LAD 2001 [facility], PTCA/atherectomy ramus and BMS to LAD 2009  . S/P colonoscopy     Normal via ostomy - September 2009  . Chronic systolic heart failure   . Myocardial infarction, anterior wall     Treated with [facility]PA at [facility]  . GERD (gastroesophageal reflux disease)   . Cardiomyopathy, ischemic     BIV ICD St. Jude, LVEF 23%  . Paroxysmal atrial fibrillation     Amiodarone and Coumadin  . 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 Prostate cancer     s/p seed implants with chemo and radiation  . 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 Transaminitis 12/12/2013  . CHF (congestive heart failure)   . TIA (transient ischemic attack)   . OSA on CPAP   . Automatic implantable cardioverter-defibrillator in situ     Past Surgical History  Procedure Laterality Date  . Internal defibrillator and pacemaker  2010    x2 St. Jude device  . Abdominal and perineal resection of rectum with total mesorectal excision  10/04/2007  . Colonoscopy      05/17/2007. IMPRESSION: Semilunar, apple-core neoplasm low in the rectum (palpable on digital rectal exam) beginning at 5 cm and  corkscrewing up 5 cm in length. This was a low rectal lesion consistent with colorectal carcinoma. It was biosied multiple times. The upstream colon all the way to the cecum appeared normal. Recommendations: Followup on path. Surgical Consultation   . Colonoscopy  09/14/2011    Dr. Gala Romney: via colostomy, Single pedunculated benign inflammatory polyp. Due for surveillance Oct 2015  . Colostomy    . Colonoscopy N/A 07/02/2014    Procedure: COLONOSCOPY;  Surgeon: Daneil Dolin, MD;  [facility];  Service: Endoscopy;  Laterality:  N/A;  7:30 / COLONOSCOPY THRU COLOSTOMY  . Esophagogastroduodenoscopy N/A 07/02/2014    Procedure: ESOPHAGOGASTRODUODENOSCOPY (EGD);  Surgeon: Daneil Dolin, MD;  [facility];  Service: Endoscopy;  Laterality: N/A;  7:30  . Savory dilation N/A 07/02/2014    Procedure: SAVORY DILATION;  Surgeon: Daneil Dolin, MD;  [facility];  Service: Endoscopy;  Laterality: N/A;  7:30  . Maloney dilation N/A 07/02/2014    Procedure: Venia Minks DILATION;  Surgeon: Daneil Dolin, MD;  [facility];  Service: Endoscopy;  Laterality: N/A;  7:30  . Portacath placement  06/2007    "removed ~ 1 yr later"  . Cardiac catheterization  08/2001  . Coronary angioplasty with stent placement  2001; ~ 2006    "1 + 1"     Pryor Ochoa RD, LDN Inpatient Clinical Dietitian Pager: [phone] After Hours Pager: [phone]

## 2014-07-18 NOTE — Progress Notes (Signed)
DAILY PROGRESS NOTE  Subjective:  Creatinine improved overnight. BNP is 1116. He had 1L bolus of IV fluids in the ER. Toprol decreased, lasix held, enalapril held.  Objective:  Temp:  [97.1 F (36.2 C)-97.5 F (36.4 C)] 97.1 F (36.2 C) (08/13 0630) Pulse Rate:  [68-85] 70 (08/13 0630) Resp:  [13-19] 19 (08/13 0630) BP: (84-119)/(45-73) 119/70 mmHg (08/13 0630) SpO2:  [98 %-100 %] 100 % (08/13 0630) Weight:  [210 lb (95.255 kg)-214 lb 11.7 oz (97.4 kg)] 210 lb 15.7 oz (95.7 kg) (08/13 0630) Weight change:   Intake/Output from previous day: 08/12 0701 - 08/13 0700 In: 240 [P.O.:240] Out: 1325 [Urine:1325]  Intake/Output from this shift: Total I/O In: 320 [P.O.:320] Out: -   Medications: Current Facility-Administered Medications  Medication Dose Route Frequency Provider Last Rate Last Dose  . amiodarone (PACERONE) tablet 200 mg  200 mg Oral Daily Tarri Fuller, PA-C   200 mg at 07/17/14 2125  . apixaban (ELIQUIS) tablet 5 mg  5 mg Oral BID Tarri Fuller, PA-C   5 mg at 07/17/14 2125  . aspirin EC tablet 81 mg  81 mg Oral QHS Tarri Fuller, PA-C   81 mg at 07/17/14 2125  . atorvastatin (LIPITOR) tablet 80 mg  80 mg Oral q1800 Tarri Fuller, PA-C      . citalopram (CELEXA) tablet 20 mg  20 mg Oral Daily Tarri Fuller, PA-C   20 mg at 07/17/14 2124  . digoxin (LANOXIN) tablet 0.125 mg  0.125 mg Oral Tiffany Kocher, PA-C   0.125 mg at 07/17/14 2124  . insulin aspart (novoLOG) injection 0-15 Units  0-15 Units Subcutaneous TID WC Tarri Fuller, PA-C   3 Units at 07/18/14 [phone]  . metoprolol succinate (TOPROL-XL) 24 hr tablet 50 mg  50 mg Oral BID WC Tarri Fuller, PA-C   50 mg at 07/18/14 1696  . niacin (NIASPAN) CR tablet 1,000 mg  1,000 mg Oral QHS Tarri Fuller, PA-C   1,000 mg at 07/17/14 2125  . omega-3 acid ethyl esters (LOVAZA) capsule 1 g  1 g Oral BID Tarri Fuller, PA-C   1 g at 07/17/14 2124  . pantoprazole (PROTONIX) EC tablet 40 mg  40 mg Oral Daily Tarri Fuller, PA-C   40 mg at  07/17/14 2124  . ranolazine (RANEXA) 12 hr tablet 1,000 mg  1,000 mg Oral BID Tarri Fuller, PA-C   1,000 mg at 07/17/14 2125    Physical Exam: General appearance: alert and no distress Lungs: clear to auscultation bilaterally Heart: regular rate and rhythm, S1, S2 normal, no murmur, click, rub or gallop Extremities: extremities normal, atraumatic, no cyanosis or edema  Lab Results: Results for orders placed during the hospital encounter of 07/17/14 (from the past 48 hour(s))  CBC WITH DIFFERENTIAL     Status: Abnormal   Collection Time    07/17/14 11:01 AM      Result Value Ref Range   WBC 8.0  4.0 - 10.5 K/uL   RBC 4.25  4.22 - 5.81 MIL/uL   Hemoglobin 13.3  13.0 - 17.0 g/dL   HCT 38.8 (*) 39.0 - 52.0 %   MCV 91.3  78.0 - 100.0 fL   MCH 31.3  26.0 - 34.0 pg   MCHC 34.3  30.0 - 36.0 g/dL   RDW 15.6 (*) 11.5 - 15.5 %   Platelets 120 (*) 150 - 400 K/uL   Neutrophils Relative % 73  43 - 77 %   Neutro Abs 5.8  1.7 - 7.7 K/uL   Lymphocytes Relative 15  12 - 46 %   Lymphs Abs 1.2  0.7 - 4.0 K/uL   Monocytes Relative 7  3 - 12 %   Monocytes Absolute 0.6  0.1 - 1.0 K/uL   Eosinophils Relative 5  0 - 5 %   Eosinophils Absolute 0.4  0.0 - 0.7 K/uL   Basophils Relative 0  0 - 1 %   Basophils Absolute 0.0  0.0 - 0.1 K/uL  BASIC METABOLIC PANEL     Status: Abnormal   Collection Time    07/17/14 11:01 AM      Result Value Ref Range   Sodium 135 (*) 137 - 147 mEq/L   Potassium 5.6 (*) 3.7 - 5.3 mEq/L   Chloride 96  96 - 112 mEq/L   CO2 25  19 - 32 mEq/L   Glucose, Bld 205 (*) 70 - 99 mg/dL   BUN 38 (*) 6 - 23 mg/dL   Creatinine, Ser 1.85 (*) 0.50 - 1.35 mg/dL   Calcium 9.8  8.4 - 10.5 mg/dL   GFR calc non Af Amer 38 (*) >90 mL/min   GFR calc Af Amer 44 (*) >90 mL/min   Comment: (NOTE)     The eGFR has been calculated using the CKD EPI equation.     This calculation has not been validated in all clinical situations.     eGFR's persistently <90 mL/min signify possible Chronic Kidney      Disease.   Anion gap 14  5 - 15  GLUCOSE, CAPILLARY     Status: Abnormal   Collection Time    07/18/14  5:54 AM      Result Value Ref Range   Glucose-Capillary 169 (*) 70 - 99 mg/dL  BASIC METABOLIC PANEL     Status: Abnormal   Collection Time    07/18/14  5:55 AM      Result Value Ref Range   Sodium 135 (*) 137 - 147 mEq/L   Potassium 5.2  3.7 - 5.3 mEq/L   Comment: HEMOLYSIS AT THIS LEVEL MAY AFFECT RESULT   Chloride 101  96 - 112 mEq/L   CO2 22  19 - 32 mEq/L   Glucose, Bld 181 (*) 70 - 99 mg/dL   BUN 32 (*) 6 - 23 mg/dL   Creatinine, Ser 1.69 (*) 0.50 - 1.35 mg/dL   Calcium 9.1  8.4 - 10.5 mg/dL   GFR calc non Af Amer 43 (*) >90 mL/min   GFR calc Af Amer 49 (*) >90 mL/min   Comment: (NOTE)     The eGFR has been calculated using the CKD EPI equation.     This calculation has not been validated in all clinical situations.     eGFR's persistently <90 mL/min signify possible Chronic Kidney     Disease.   Anion gap 12  5 - 15  PRO B NATRIURETIC PEPTIDE     Status: Abnormal   Collection Time    07/18/14  5:55 AM      Result Value Ref Range   Pro B Natriuretic peptide (BNP) 1116.0 (*) 0 - 125 pg/mL    Imaging: Ct Head Wo Contrast  07/17/2014   CLINICAL DATA:  Dizziness and fall  EXAM: CT HEAD WITHOUT CONTRAST  TECHNIQUE: Contiguous axial images were obtained from the base of the skull through the vertex without intravenous contrast.  COMPARISON:  CT scan of the brain of February 28, 2014  FINDINGS: The  ventricles are normal in size and position. There is no intracranial hemorrhage nor intracranial mass effect. There is no acute ischemic change. The cerebellum and brainstem are normal.  The observed paranasal sinuses and mastoid air cells are clear. There is no acute skull fracture.  IMPRESSION: There is no acute intracranial hemorrhage nor other acute intracranial abnormality.   Electronically Signed   By: David  Martinique   On: 07/17/2014 15:15   Dg Chest Portable 1  View  07/17/2014   CLINICAL DATA:  Dizziness. History of myocardial infarction, hypertension and diabetes.  EXAM: PORTABLE CHEST - 1 VIEW  COMPARISON:  07/02/2014 and 05/19/2014 radiographs.  FINDINGS: 1111 hr. The left subclavian pacemaker/AICD leads appear unchanged. The heart size and mediastinal contours are stable. The lungs are clear. There is no pleural effusion or pneumothorax. No acute osseous findings are evident.  IMPRESSION: Stable chest.  No acute cardiopulmonary process.   Electronically Signed   By: Camie Patience M.D.   On: 07/17/2014 12:00    Assessment:  1. Principal Problem: 2.   Hypotension 3. Active Problems: 4.   DIABETES MELLITUS 5.   CARDIOMYOPATHY, ISCHEMIC, with BiV ICD, st Jude EF 23% 6.   Long term (current) use of anticoagulants 7.   Chronic systolic heart failure 8.   Hypovolemia 9.   Plan:  1. Briefly dizzy when sitting up this morning. Creatinine improved. Will decrease medications. Ambulate with nursing today. Re-check orthostatics. Continue to hold lasix today and restart tomorrow. May be able to discharge later today.  Time Spent Directly with Patient:  15 minutes  Length of Stay:  LOS: 1 day   Pixie Casino, MD, [facility] Attending Cardiologist [facility]  Madysin Crisp C 07/18/2014, 9:45 AM

## 2014-07-19 ENCOUNTER — Encounter (HOSPITAL_COMMUNITY): Payer: Self-pay | Admitting: Physician Assistant

## 2014-07-19 ENCOUNTER — Other Ambulatory Visit: Payer: Self-pay

## 2014-07-19 ENCOUNTER — Telehealth: Payer: Self-pay | Admitting: *Deleted

## 2014-07-19 DIAGNOSIS — I951 Orthostatic hypotension: Principal | ICD-10-CM

## 2014-07-19 DIAGNOSIS — G4733 Obstructive sleep apnea (adult) (pediatric): Secondary | ICD-10-CM | POA: Diagnosis present

## 2014-07-19 DIAGNOSIS — I5022 Chronic systolic (congestive) heart failure: Secondary | ICD-10-CM | POA: Diagnosis not present

## 2014-07-19 DIAGNOSIS — E669 Obesity, unspecified: Secondary | ICD-10-CM | POA: Diagnosis not present

## 2014-07-19 DIAGNOSIS — R42 Dizziness and giddiness: Secondary | ICD-10-CM | POA: Diagnosis not present

## 2014-07-19 DIAGNOSIS — I2510 Atherosclerotic heart disease of native coronary artery without angina pectoris: Secondary | ICD-10-CM | POA: Diagnosis present

## 2014-07-19 DIAGNOSIS — I4891 Unspecified atrial fibrillation: Secondary | ICD-10-CM

## 2014-07-19 DIAGNOSIS — Z9989 Dependence on other enabling machines and devices: Secondary | ICD-10-CM

## 2014-07-19 LAB — GLUCOSE, CAPILLARY
GLUCOSE-CAPILLARY: 294 mg/dL — AB (ref 70–99)
Glucose-Capillary: 188 mg/dL — ABNORMAL HIGH (ref 70–99)

## 2014-07-19 MED ORDER — ENALAPRIL MALEATE 2.5 MG PO TABS: 2.5000 mg | ORAL_TABLET | Freq: Every day | ORAL | Status: DC

## 2014-07-19 MED ORDER — FUROSEMIDE 20 MG PO TABS: 10.0000 mg | ORAL_TABLET | Freq: Every day | ORAL | Status: DC

## 2014-07-19 MED ORDER — PANTOPRAZOLE SODIUM 40 MG PO TBEC: 40.0000 mg | DELAYED_RELEASE_TABLET | Freq: Every day | ORAL | Status: DC

## 2014-07-19 MED ORDER — CITALOPRAM HYDROBROMIDE 20 MG PO TABS: 20.0000 mg | ORAL_TABLET | Freq: Every day | ORAL | Status: DC

## 2014-07-19 MED ORDER — METOPROLOL SUCCINATE ER 25 MG PO TB24: 25.0000 mg | ORAL_TABLET | Freq: Two times a day (BID) | ORAL | Status: DC

## 2014-07-19 MED ORDER — METOPROLOL SUCCINATE ER 25 MG PO TB24
25.0000 mg | ORAL_TABLET | Freq: Two times a day (BID) | ORAL | Status: DC
  Filled 2014-07-19 (×2): qty 1

## 2014-07-19 MED ORDER — NITROGLYCERIN 0.4 MG/SPRAY TL SOLN: 1.0000 | Status: DC | PRN

## 2014-07-19 NOTE — Progress Notes (Signed)
Pt. Seen and examined. Agree with the NP/PA-C note as written.  Still with some quick positional dizziness. Feels better, however and wants to go home. On discharge, decrease enalapril to 2.5 mg daily, decrease lasix to 10 mg daily, decrease Toprol XL to 25 mg BID. Continue digoxin. Will need early follow-up with [facility] and can follow-up with Dr. Harl Bowie in [place] as well.  [place] for d/c today.  Pixie Casino, MD, [facility] Attending Cardiologist [place]

## 2014-07-19 NOTE — Telephone Encounter (Signed)
Pt needs protonix and nitro spray and celexa called in to [address]. Pt is out and needs them for the weekend

## 2014-07-19 NOTE — Discharge Summary (Signed)
Discharge Summary   Patient ID: [name] MRN: [id number], DOB/AGE: 1956-01-02 58 y.o. Admit date: 07/17/2014 D/C date:     07/19/2014  Primary Cardiologist: Dr. Pernell Dupre ([place]) and Dr. Wyline Mood [facility])   Principal Problem:   Orthostatic hypotension Active Problems:   DIABETES MELLITUS   HYPERTENSION   CARDIOMYOPATHY, ISCHEMIC, with BiV ICD, st Jude EF 23%   PAF (paroxysmal atrial fibrillation)   Long term (current) use of anticoagulants   Chronic systolic heart failure    CKD III   Obesity   Hypovolemia   Dizziness   TIA (transient ischemic attack)   GERD (gastroesophageal reflux disease)   CAD (coronary artery disease)   OSA on CPAP   Discharge Diagnosis: Dizziness and orthostatic hypotension.   HPI: [name] is a 58 y.o. male with a history of HTN, HLD, DM, CAD BMS to LAD (2001); BMS to LAD (2009), GERD, rectal carcinoma, CKD and OSA on CPAP who presented to [facility] on 07/17/14 with dizziness and found to be hypotensive.    The patient reported feeling wobbly and dizzy about once a month since Feb and he had the first episode last Sept. It reoccurred the day prior to admission and then twice more on the morning of admission. It seems to happen right after position change. He also reported some nausea and vomiting. Orthostatic BP: Lying 73/45. Sitting 68/42 in the ED. He had a history of hypotension in the past. The patient denied fever, chest pain, shortness of breath, orthopnea, PND, cough, congestion, abdominal pain, hematochezia, melena, lower extremity edema, claudication. He was admitted for further observation and IVFs.    Hospital Course: He received a of NS in the ER. KVO fluids. Initially all BP meds held.   Dizziness/orthostasis- no further issues after careful hydration. This seems to be a chronic problem for him and is documented in prior office notes as occuring every few months.  -- Given BB and ACE yesterday. Lasix held. BB not given this  AM due to hypotension. Last pressure 92/60. Patient states that BPs consistently run this low at home and it is felt safe to discharge him home with close follow up. His BB and ACE have been decreased.   Chronic systolic CHF/ Ischemic CM with BiV ICD- st Jude EF 23%. BNP 1.1K. CXR clear and he is not SOB. --2D ECHO: 03/01/2014 EF 15-20%, severe, diffuse hypsokinesis. Mod LV dilation. Mod LVH, indeterminate diastolic dysfxn (poor study), high vent filling pressures, abnormal septal wall motion and dyssynergy c/w RV pacing, mild MR, mod LA dilation, RV systolic function mod reduced, mild TR, PA pressure 42.  -- He does not seen volume overloaded on exam. Per prior office notes Dr. Pernell Dupre noted that he had a period where he was off his benazepril and seemed to decompensate as a results with worsening congestion. He was started on enalapril 10 mg BID and his congestion improved. He was then seen later by Joni Reining NP in [place] office who decreased it down to 10 mg qd due to hypotension. His enalapril was held and then added back the following day at a much lower dose. He is currently on 2.5mg  qd due to hypotension. Will continue this and hopefully it can be up-titrated by Dr. Pernell Dupre as an outpatient. We will also decrease lasix to 10 mg daily and decrease Toprol XL to 25 mg BID.   HLD- continue statin, niacin and lovaza.   Diabetes mellitus- continue home regimen.  CAD- continue ASA, statin, BB and ranexa  -- Last [facility] 07/2013 revealed ischemic cardiomyopathy with a widely patent proximal LAD stent and moderate segmental calcified proximal LAD stenosis. His first diagonal was totally occluded and diffusely diseased  -- No chest pain.   PAF- continue amiodarone 200mg  qd, Eliquis 5mg  BID and Digoxin. His medicines are followed by Dr. Pernell Dupre in [facility] (cardiology).  He lives in [place] and is followed by Dr. Wyline Mood. However, he has been closely followed for years by Dr. Pernell Dupre at [facility] and has a  previously scheduled appointment on 07/29/14. I have also made an appointment in the [place] office with Herma Carson in 1 week for TOC appt.  The patient has had an uncomplicated hospital course and is recovering well. He has been seen by Dr. Rennis Golden today and deemed ready for discharge home. All follow-up appointments have been scheduled.  Discharge medications are listed below. On discharge we will decrease enalapril to 2.5 mg daily, decrease lasix to 10 mg daily and decrease Toprol XL to 25 mg BID. Continue digoxin, amiodarone 200mg  qd, Eliquis 5mg  BID, Ranexa 1000 mg and all other home meds.    Discharge Vitals: Blood pressure 100/64, pulse 79, temperature 98 F (36.7 C), temperature source Oral, resp. rate 18, height 5\' 10"  (1.778 m), weight 210 lb 5.1 oz (95.4 kg), SpO2 97.00%.  Labs: Lab Results  Component Value Date   WBC 8.0 07/17/2014   HGB 13.3 07/17/2014   HCT 38.8* 07/17/2014   MCV 91.3 07/17/2014   PLT 120* 07/17/2014     Recent Labs Lab 07/18/14 0555  NA 135*  K 5.2  CL 101  CO2 22  BUN 32*  CREATININE 1.69*  CALCIUM 9.1  GLUCOSE 181*    Lab Results  Component Value Date   CHOL 115 03/01/2014   HDL 34* 03/01/2014   LDLCALC 51 03/01/2014   TRIG 149 03/01/2014     Diagnostic Studies/Procedures   Ct Head Wo Contrast  07/17/2014 CLINICAL DATA: Dizziness and fall EXAM: CT HEAD WITHOUT CONTRAST TECHNIQUE: Contiguous axial images were obtained from the base of the skull through the vertex without intravenous contrast. COMPARISON: CT scan of the brain of February 28, 2014 FINDINGS: The ventricles are normal in size and position. There is no intracranial hemorrhage nor intracranial mass effect. There is no acute ischemic change. The cerebellum and brainstem are normal. The observed paranasal sinuses and mastoid air cells are clear. There is no acute skull fracture. IMPRESSION: There is no acute intracranial hemorrhage nor other acute intracranial abnormality.    Dg Chest  Portable 1 View  07/17/2014 CLINICAL DATA: Dizziness. History of myocardial infarction, hypertension and diabetes. EXAM: PORTABLE CHEST - 1 VIEW COMPARISON: 07/02/2014 and 05/19/2014 radiographs. FINDINGS: 1111 hr. The left subclavian pacemaker/AICD leads appear unchanged. The heart size and mediastinal contours are stable. The lungs are clear. There is no pleural effusion or pneumothorax. No acute osseous findings are evident. IMPRESSION: Stable chest. No acute cardiopulmonary process.    ANGIOGRAPHIC RESULTS:  1. Left main; normal  2. LAD; the entire proximal third of the LAD was fluoroscopically calcified. The proximal third had approximately 50-60% segmental stenosis. The stent was widely patent in the proximal third of the LAD with 30-40% in-stent restenosis". There was a moderate size first diagonal branch and the ramus distribution that was occluded in its proximal portion and filled by collaterals. This was noted to be highly diseased at his last cath in 2009. There was 50-60% segmental stenosis in  the middle third and 70% in the distal/apical third. There were 2 small marginal branches arising from the middle third that had 90% ostial stenoses unchanged from prior cath  3. Left circumflex; dominant with 99% long segmental proximal OM1 stenosis in a small to medium-size vessel unchanged from prior cath  4. Right coronary artery; nondominant with 50% mid and 80% distal stenosis unchanged from prior cath  5. Left ventriculography; not performed today to conserve contrast    2D ECHO: 03/01/2014  Study Conclusions - Procedure narrative: Transthoracic echocardiography. Image quality was suboptimal. The study was technically difficult, as a result of poor sound wave transmission. - Left ventricle: Systolic function is severely reduced, estimated EF 15-20%. Severe diffuse hypokinesis is noted. The cavity size was moderately dilated. Wall thickness was increased in a pattern of moderate LVH.  Diastolic dysfunction is seen, indeterminate grade. The apex was poorly visualized. Doppler parameters are consistent with high ventricular filling pressure. - Ventricular septum: Septal motion showed abnormal function and dyssynergy. These changes are consistent with right ventricular pacing. - Aortic valve: Trileaflet; mildly thickened leaflets. There was no stenosis. - Mitral valve: Mildly thickened leaflets . Mild tethering of leaflet motion due to severe left ventricular dysfunction. Mild regurgitation. - Left atrium: The atrium was mildly to moderately dilated. - Right ventricle: The cavity size was normal. Wall thickness was normal. Pacer wire or catheter noted in right ventricle. Systolic function was mildly to moderately reduced. - Right atrium: Pacer wire or catheter noted in right atrium. - Atrial septum: The septum bowed from left to right, consistent with increased left atrial pressure. - Tricuspid valve: Mild regurgitation. - Pulmonary arteries: PA peak pressure: 42mm Hg (S). Mildly elevated pulmonary pressures.     Discharge Medications     Medication List         amiodarone 200 MG tablet  Commonly known as:  PACERONE  Take 1 tablet (200 mg total) by mouth daily.     aspirin 81 MG EC tablet  Take 81 mg by mouth at bedtime.     citalopram 20 MG tablet  Commonly known as:  CELEXA  Take 1 tablet (20 mg total) by mouth daily.     co-enzyme Q-10 50 MG capsule  Take 50 mg by mouth every morning.     digoxin 0.125 MG tablet  Commonly known as:  LANOXIN  Take 1 tablet (0.125 mg total) by mouth every other day.     ELIQUIS 5 MG Tabs tablet  Generic drug:  apixaban  Take 5 mg by mouth 2 (two) times daily.     enalapril 2.5 MG tablet  Commonly known as:  VASOTEC  Take 1 tablet (2.5 mg total) by mouth daily.     Fish Oil 1200 MG Caps  Take 1,200 mg by mouth 2 (two) times daily.     fluticasone 50 MCG/ACT nasal spray  Commonly known as:  FLONASE    Place 1 spray into both nostrils daily as needed for allergies.     furosemide 20 MG tablet  Commonly known as:  LASIX  Take 0.5 tablets (10 mg total) by mouth daily.     metFORMIN 500 MG tablet  Commonly known as:  GLUCOPHAGE  Take 500 mg by mouth 2 (two) times daily with a meal.     metoprolol succinate 25 MG 24 hr tablet  Commonly known as:  TOPROL-XL  Take 1 tablet (25 mg total) by mouth 2 (two) times daily.     niacin 1000  MG CR tablet  Commonly known as:  NIASPAN  Take 1,000 mg by mouth at bedtime.     nitroGLYCERIN 0.4 MG/SPRAY spray  Commonly known as:  NITROLINGUAL  Place 1 spray under the tongue every 5 (five) minutes as needed. angina     pantoprazole 40 MG tablet  Commonly known as:  PROTONIX  Take 1 tablet (40 mg total) by mouth daily.     ranolazine 1000 MG SR tablet  Commonly known as:  RANEXA  Take 1 tablet (1,000 mg total) by mouth 2 (two) times daily.     rosuvastatin 40 MG tablet  Commonly known as:  CRESTOR  Take 40 mg by mouth at bedtime.        Disposition   The patient will be discharged in stable condition to home.  Follow-up Information   Follow up with Jacolyn Reedy, PA-C On 07/24/2014. (@ 1 pm)    Specialty:  Cardiology   Contact information:   [address] [place] [address] [phone]       Follow up with ADAMS,KIRKWOOD, MD On 07/29/2014.   Specialty:  Internal Medicine   Contact information:   [address] [facility] [id number] [place]eline Gula [place] [address] [phone]         Duration of Discharge Encounter: Greater than 30 minutes including physician and PA time.  SignedVenetia Maxon, Annaelle Kasel PA-C 07/19/2014, 11:18 AM

## 2014-07-19 NOTE — Progress Notes (Signed)
Inpatient Diabetes Program Recommendations  AACE/ADA: New Consensus Statement on Inpatient Glycemic Control (2013)  Target Ranges:  Prepandial:   less than 140 mg/dL      Peak postprandial:   less than 180 mg/dL (1-2 hours)      Critically ill patients:  140 - 180 mg/dL     Results for [name], [name] (MRN [id number]) as of 07/19/2014 10:04  Ref. Range 07/18/2014 05:54 07/18/2014 12:33 07/18/2014 16:38 07/18/2014 21:03  Glucose-Capillary Latest Range: 70-99 mg/dL 169 (H) 275 (H) 218 (H) 179 (H)     **Eating 50-75% of meals.  Having elevated postprandial glucose levels.    MD- If patient not discharged home today, please consider adding Novolog Meal Coverage to hospital regimen Novolog 4 units tid with meals    Will follow Wyn Quaker RN, MSN, CDE Diabetes Coordinator Inpatient Diabetes Program Team Pager: [phone] (8a-10p)

## 2014-07-19 NOTE — Progress Notes (Signed)
Pt a/o, no c/o pain, pt denies dizziness, pt stable

## 2014-07-19 NOTE — ED Provider Notes (Signed)
I saw and evaluated the patient, reviewed the resident's note and I agree with the findings and plan.   EKG Interpretation   Date/Time:  Wednesday July 17 2014 10:29:40 EDT Ventricular Rate:  70 PR Interval:  208 QRS Duration: 166 QT Interval:  485 QTC Calculation: 523 R Axis:   -144 Text Interpretation:  Sinus rhythm Borderline prolonged PR interval Right  bundle branch block Anterolateral infarct, age indeterminate ED PHYSICIAN  INTERPRETATION AVAILABLE IN [facility] Confirmed by TEST, Record  ([id number]) on 07/19/2014 7:23:23 AM       Patient with vertigo/dizziness. Patient has poor EF and has hypotension. Cardiology consult and they will admit to change his medicines.  Ephraim Hamburger, MD 07/19/14 1131

## 2014-07-19 NOTE — Care Management Note (Addendum)
  Page 1 of 1   07/19/2014     4:19:42 PM CARE MANAGEMENT NOTE 07/19/2014  Patient:  [name], [name]   Account Number:  [id number]  Date Initiated:  07/19/2014  Documentation initiated by:  Mariann Laster  Subjective/Objective Assessment:   Hypotension/vertigo/orthostatics +  Observation     Action/Plan:   CM to follow for disposition needs   Anticipated DC Date:  07/19/2014   Anticipated DC Plan:  HOME/SELF CARE         Choice offered to / List presented to:             Status of service:  Completed, signed off Medicare Important Message given?   (If response is "NO", the following Medicare IM given date fields will be blank) Date Medicare IM given:   Medicare IM given by:   Date Additional Medicare IM given:   Additional Medicare IM given by:    Discharge Disposition:  HOME/SELF CARE  Per UR Regulation:    If discussed at Long Length of Stay Meetings, dates discussed:    Comments:  Marquis Down RN, BSN, MSHL, CCM  Nurse - Case Manager,  (Unit [place])  [phone]  07/19/2014 IM - n/a Observation

## 2014-07-19 NOTE — Progress Notes (Signed)
Notified on-call doctor Dr. Idolina Primer of low blood pressure of 92/60. Patient was resting but states no dizziness when asked. Orders given to hold metoprolol and to have doctors evaluate and address when making rounds. Will continue to monitor patient to end of shift.

## 2014-07-19 NOTE — Progress Notes (Signed)
Patient Name: [name] Date of Encounter: 07/19/2014     Principal Problem:   Hypotension Active Problems:   DIABETES MELLITUS   CARDIOMYOPATHY, ISCHEMIC, with BiV ICD, st Jude EF 23%   Long term (current) use of anticoagulants   Chronic systolic heart failure   Hypovolemia    SUBJECTIVE  Feeling well. No lightheadedness or dizziness. Wants to go home.   CURRENT MEDS . amiodarone  200 mg Oral Daily  . apixaban  5 mg Oral BID  . aspirin EC  81 mg Oral QHS  . atorvastatin  80 mg Oral q1800  . citalopram  20 mg Oral Daily  . digoxin  0.125 mg Oral QODAY  . enalapril  2.5 mg Oral Daily  . feeding supplement (GLUCERNA SHAKE)  237 mL Oral Q24H  . furosemide  10 mg Oral Daily  . insulin aspart  0-15 Units Subcutaneous TID WC  . metoprolol succinate  50 mg Oral BID WC  . multivitamin with minerals  1 tablet Oral Daily  . niacin  1,000 mg Oral QHS  . omega-3 acid ethyl esters  1 g Oral BID  . pantoprazole  40 mg Oral Daily  . ranolazine  1,000 mg Oral BID    OBJECTIVE  Filed Vitals:   07/18/14 1727 07/18/14 2025 07/19/14 0500 07/19/14 0623  BP: 115/68 104/64 90/51 92/60   Pulse: 70 70 70 79  Temp:  98.1 F (36.7 C) 98 F (36.7 C)   TempSrc:  Oral Oral   Resp:  16 18   Height:      Weight:   210 lb 5.1 oz (95.4 kg)   SpO2:  98% 97%     Intake/Output Summary (Last 24 hours) at 07/19/14 1012 Last data filed at 07/19/14 0819  Gross per 24 hour  Intake    660 ml  Output   2975 ml  Net  -2315 ml   Filed Weights   07/17/14 1854 07/18/14 0630 07/19/14 0500  Weight: 214 lb 11.7 oz (97.4 kg) 210 lb 15.7 oz (95.7 kg) 210 lb 5.1 oz (95.4 kg)    PHYSICAL EXAM  General appearance: alert and no distress  Lungs: clear to auscultation bilaterally  Heart: regular rate and rhythm, S1, S2 normal, no murmur, click, rub or gallop  Extremities: extremities normal, atraumatic, no cyanosis or edema   Accessory Clinical Findings  CBC  Recent Labs  07/17/14 1101    WBC 8.0  NEUTROABS 5.8  HGB 13.3  HCT 38.8*  MCV 91.3  PLT 947*   Basic Metabolic Panel  Recent Labs  07/17/14 1101 07/18/14 0555  NA 135* 135*  K 5.6* 5.2  CL 96 101  CO2 25 22  GLUCOSE 205* 181*  BUN 38* 32*  CREATININE 1.85* 1.69*  CALCIUM 9.8 9.1    TELE  Periods of pacing and, one run on wide com  Radiology/Studies  Ct Head Wo Contrast  07/17/2014   CLINICAL DATA:  Dizziness and fall  EXAM: CT HEAD WITHOUT CONTRAST  TECHNIQUE: Contiguous axial images were obtained from the base of the skull through the vertex without intravenous contrast.  COMPARISON:  CT scan of the brain of February 28, 2014  FINDINGS: The ventricles are normal in size and position. There is no intracranial hemorrhage nor intracranial mass effect. There is no acute ischemic change. The cerebellum and brainstem are normal.  The observed paranasal sinuses and mastoid air cells are clear. There is no acute skull fracture.  IMPRESSION: There is  no acute intracranial hemorrhage nor other acute intracranial abnormality.     Dg Chest Portable 1 View  07/17/2014   CLINICAL DATA:  Dizziness. History of myocardial infarction, hypertension and diabetes.  EXAM: PORTABLE CHEST - 1 VIEW  COMPARISON:  07/02/2014 and 05/19/2014 radiographs.  FINDINGS: 1111 hr. The left subclavian pacemaker/AICD leads appear unchanged. The heart size and mediastinal contours are stable. The lungs are clear. There is no pleural effusion or pneumothorax. No acute osseous findings are evident.  IMPRESSION: Stable chest.  No acute cardiopulmonary process.      ANGIOGRAPHIC RESULTS:  1. Left main; normal  2. LAD; the entire proximal third of the LAD was fluoroscopically calcified. The proximal third had approximately 50-60% segmental stenosis. The stent was widely patent in the proximal third of the LAD with 30-40% in-stent restenosis". There was a moderate size first diagonal branch and the ramus distribution that was occluded in its proximal  portion and filled by collaterals. This was noted to be highly diseased at his last cath in 2009. There was 50-60% segmental stenosis in the middle third and 70% in the distal/apical third. There were 2 small marginal branches arising from the middle third that had 90% ostial stenoses unchanged from prior cath  3. Left circumflex; dominant with 99% long segmental proximal OM1 stenosis in a small to medium-size vessel unchanged from prior cath  4. Right coronary artery; nondominant with 50% mid and 80% distal stenosis unchanged from prior cath  5. Left ventriculography; not performed today to conserve contrast    2D ECHO: 03/01/2014 Study Conclusions - Procedure narrative: Transthoracic echocardiography. Image quality was suboptimal. The study was technically difficult, as a result of poor sound wave transmission. - Left ventricle: Systolic function is severely reduced, estimated EF 15-20%. Severe diffuse hypokinesis is noted. The cavity size was moderately dilated. Wall thickness was increased in a pattern of moderate LVH. Diastolic dysfunction is seen, indeterminate grade. The apex was poorly visualized. Doppler parameters are consistent with high ventricular filling pressure. - Ventricular septum: Septal motion showed abnormal function and dyssynergy. These changes are consistent with right ventricular pacing. - Aortic valve: Trileaflet; mildly thickened leaflets. There was no stenosis. - Mitral valve: Mildly thickened leaflets . Mild tethering of leaflet motion due to severe left ventricular dysfunction. Mild regurgitation. - Left atrium: The atrium was mildly to moderately dilated. - Right ventricle: The cavity size was normal. Wall thickness was normal. Pacer wire or catheter noted in right ventricle. Systolic function was mildly to moderately reduced. - Right atrium: Pacer wire or catheter noted in right atrium. - Atrial septum: The septum bowed from left to right, consistent  with increased left atrial pressure. - Tricuspid valve: Mild regurgitation. - Pulmonary arteries: PA peak pressure: 61mm Hg (S). Mildly elevated pulmonary pressures.    ASSESSMENT AND PLAN  [name] is a 58 y.o. male with a history of HTN, HLD, DM, CAD BMS to LAD (2001); BMS to LAD (2009), GERD, rectal carcinoma and OSA on CPAP who presented to [facility] on 07/17/14 with dizziness.   Dizziness- no further issues. This seems to be a chronic problem for him and is documented in prior office notes as occuring every few months.   Hypotension- Given BB and ACE yesterday. Lasix held. BB not given this AM due to hypotension. Last pressure 92/60. Patient states that BPs consistently run this low at home.   Chronic systolic CHF/ Ischemic CM with BiV ICD- st Jude EF 23%  --2D ECHO: 03/01/2014 EF 15-20%,  severe, diffuse hypsokinesis. Mod LV dilation. Mod LVH, indeterminate diastolic dysfxn (poor study), high vent filling pressures, abnormal septal wall motion and dyssynergy c/w RV pacing, mild MR, mod LA dilation, RV systolic function mod reduced, mild TR, PA pressure 42.   HLD- continue statin, niacin and lovaza.  Diabetes mellitus- continue SSI  CAD- continue ASA, statin, BB and ranexa -- Last LHC 07/2013 revealed ischemic cardiomyopathy with a widely patent proximal LAD stent and moderate segmental calcified proximal LAD stenosis. His first diagonal was totally occluded and diffusely diseased  PAF- continue amiodarone 200mg  qd, Eliquis 5mg  BID and Digoxin. His medicines are followed by Dr. Andree Elk in [facility] (cardiology)   He lives in [place] and is followed by Dr. Harl Bowie. However, he has been closely followed by Dr. Andree Elk at [facility] and has a previously scheduled appointment on 07/29/14. I have also made an appointment in the [place] office with Estella Husk in 1 week for TOC appt.    Tyrell Antonio PA-C  Pager [phone]

## 2014-07-19 NOTE — Plan of Care (Signed)
Problem: Phase I Progression Outcomes Goal: EF % per last Echo/documented,Core Reminder form on chart Outcome: Completed/Met Date Met:  07/19/14 EF performed on 03/01/2014  EF% result - 15-20%

## 2014-07-24 ENCOUNTER — Encounter: Payer: Self-pay | Admitting: Physician Assistant

## 2014-07-24 ENCOUNTER — Ambulatory Visit (INDEPENDENT_AMBULATORY_CARE_PROVIDER_SITE_OTHER): Payer: 59 | Admitting: Physician Assistant

## 2014-07-24 VITALS — BP 98/62 | HR 83 | Ht 70.0 in | Wt 213.0 lb

## 2014-07-24 DIAGNOSIS — I4729 Other ventricular tachycardia: Secondary | ICD-10-CM

## 2014-07-24 DIAGNOSIS — I472 Ventricular tachycardia, unspecified: Secondary | ICD-10-CM

## 2014-07-24 DIAGNOSIS — I951 Orthostatic hypotension: Secondary | ICD-10-CM

## 2014-07-24 DIAGNOSIS — I2589 Other forms of chronic ischemic heart disease: Secondary | ICD-10-CM

## 2014-07-24 DIAGNOSIS — I4891 Unspecified atrial fibrillation: Secondary | ICD-10-CM

## 2014-07-24 DIAGNOSIS — I255 Ischemic cardiomyopathy: Secondary | ICD-10-CM

## 2014-07-24 NOTE — Assessment & Plan Note (Signed)
Patient is compensated without recurrent heart failure.

## 2014-07-24 NOTE — Assessment & Plan Note (Addendum)
Patient had 2 falls on Monday. Most likely the second fall was due to orthostatic hypotension. Unfortunately the patient was taking metoprolol 50 mg twice a day rather than 25 mg twice a day after he was in the emergency room. He was also taking spirometry on which was stopped. I asked him to stop spironolactione. He is now on metoprolol 25 mg twice a day. Followup with Dr. Andree Elk on Monday. Followup with Dr. Harl Bowie in 2 weeks.

## 2014-07-24 NOTE — Progress Notes (Signed)
HPI: This is a 58 year old male patient of Dr. Harl Bowie and Dr. Adams(cardiologist in [place]) who has history of coronary artery disease status post bare-metal stent to the LAD in 2001, repeat bare-metal stent to the LAD in 2009, ischemic cardiomyopathy with ICD placed by Dr. Caryl Comes, followed by Dr. Lovena Le, CKD, HTN, HLD, DM. Most recent cath in 2014 revealed a tight nondominant RCA lesion treated medically, history of atrial fibrillation on amiodarone, hypertension, hyperlipidemia, diabetes mellitus, and issues with orthostatic hypotension.  2-D echo 08/2013 EF was 30-35%. 2-D echo 03/01/14 EF 15-20% with severe diffuse hypokinesis.  Patient was recently hospitalized with orthostatic hypotension and received 1000 ml  of normal saline in the ER. His beta blocker and ACE inhibitors were decreased. Patient tends to decompensate when he is off his ACE inhibitor.  Patient comes in today for followup. His wife states he's fallen twice since he's been home. The first time happened Monday morning when his feet got tangled up in his bed sheets. The second episode occurred after coming home from [place]. He was walking from the car into the house and he became dizzy. He then fell forward and bruised up his back and arm. After he went home from the hospital he was actually taking his regular dose metoprolol 50 mg bid because the pharmacy didn't have the lower dose available until Monday. He was also taking spironolactone which he should not have been taking. He was also scheduled to have a colonoscopy and it was interrupted because they thought he was in V. tach and he was sent to the emergency room where he was monitored and sent home.where he was monitored and sent home. Overall he feels better and hasn't had much dizziness. He is tracking more fluids. He is scheduled to see Dr. Andree Elk in [place] on Monday.  No Known Allergies   Current Outpatient Prescriptions  Medication Sig Dispense Refill  .  amiodarone (PACERONE) 200 MG tablet Take 1 tablet (200 mg total) by mouth daily.  30 tablet  0  . apixaban (ELIQUIS) 5 MG TABS tablet Take 5 mg by mouth 2 (two) times daily.      Marland Kitchen aspirin 81 MG EC tablet Take 81 mg by mouth at bedtime.       . citalopram (CELEXA) 20 MG tablet Take 1 tablet (20 mg total) by mouth daily.  30 tablet  0  . co-enzyme Q-10 50 MG capsule Take 50 mg by mouth every morning.       . digoxin (LANOXIN) 0.125 MG tablet Take 1 tablet (0.125 mg total) by mouth every other day.  30 tablet  6  . enalapril (VASOTEC) 2.5 MG tablet Take 1 tablet (2.5 mg total) by mouth daily.  30 tablet  1  . fluticasone (FLONASE) 50 MCG/ACT nasal spray Place 1 spray into both nostrils daily as needed for allergies.       . furosemide (LASIX) 20 MG tablet Take 0.5 tablets (10 mg total) by mouth daily.  30 tablet  6  . metFORMIN (GLUCOPHAGE) 500 MG tablet Take 500 mg by mouth 2 (two) times daily with a meal.      . metoprolol succinate (TOPROL-XL) 25 MG 24 hr tablet Take 1 tablet (25 mg total) by mouth 2 (two) times daily.  60 tablet  11  . niacin (NIASPAN) 1000 MG CR tablet Take 1,000 mg by mouth at bedtime.        . nitroGLYCERIN (NITROLINGUAL) 0.4 MG/SPRAY spray Place  1 spray under the tongue every 5 (five) minutes as needed. angina  12 g  3  . Omega-3 Fatty Acids (FISH OIL) 1200 MG CAPS Take 1,200 mg by mouth 2 (two) times daily.        . pantoprazole (PROTONIX) 40 MG tablet Take 1 tablet (40 mg total) by mouth daily.  30 tablet  6  . ranolazine (RANEXA) 1000 MG SR tablet Take 1 tablet (1,000 mg total) by mouth 2 (two) times daily.  60 tablet  9  . rosuvastatin (CRESTOR) 40 MG tablet Take 40 mg by mouth at bedtime.       No current facility-administered medications for this visit.    Past Medical History  Diagnosis Date  . Essential hypertension, benign   . Type 2 diabetes mellitus   . CAD (coronary artery disease)     a. BMS to LAD 2001 at [facility] b. PTCA/atherectomy ramus and BMS to LAD  2009  . Chronic systolic heart failure     a. 2D ECHO: 03/01/2014 EF 15-20%, severe, diffuse hypsokinesis. Mod LV dilation. Mod LVH, indeterminate diastolic dysfxn (poor study), high vent filling pressures, abnormal septal wall motion and dyssynergy c/w RV pacing, mild MR, mod LA dilation, RV systolic function mod reduced, mild TR, PA pressure 42.   Marland Kitchen GERD (gastroesophageal reflux disease)   . Cardiomyopathy, ischemic     a. BIV ICD St. Jude, LVEF 23%  . Paroxysmal atrial fibrillation     a. on amiodarone, digoxin and Eliquis  . Adenocarcinoma of rectum     a. 2008  . Prostate cancer     a. s/p seed implants with chemo and radiation  . 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 St Jude  . TIA (transient ischemic attack)   . OSA on CPAP   . HLD (hyperlipidemia)   . Orthostatic hypotension   . Dizziness     a. chronic. Admission for this 07/18/2014    Past Surgical History  Procedure Laterality Date  . Internal defibrillator and pacemaker  2010    x2 St. Jude device  . Abdominal and perineal resection of rectum with total mesorectal excision  10/04/2007  . Colonoscopy      05/17/2007. IMPRESSION: Semilunar, apple-core neoplasm low in the rectum (palpable on digital rectal exam) beginning at 5 cm and corkscrewing up 5 cm in length. This was a low rectal lesion consistent with colorectal carcinoma. It was biosied multiple times. The upstream colon all the way to the cecum appeared normal. Recommendations: Followup on path. Surgical Consultation   . Colonoscopy  09/14/2011    Dr. Gala Romney: via colostomy, Single pedunculated benign inflammatory polyp. Due for surveillance Oct 2015  . Colostomy    . Colonoscopy N/A 07/02/2014    Procedure: COLONOSCOPY;  Surgeon: Daneil Dolin, MD;  Location: [facility];  Service: Endoscopy;  Laterality: N/A;  7:30 / COLONOSCOPY THRU COLOSTOMY  . Esophagogastroduodenoscopy N/A 07/02/2014    Procedure: ESOPHAGOGASTRODUODENOSCOPY (EGD);   Surgeon: Daneil Dolin, MD;  Location: [facility];  Service: Endoscopy;  Laterality: N/A;  7:30  . Savory dilation N/A 07/02/2014    Procedure: SAVORY DILATION;  Surgeon: Daneil Dolin, MD;  Location: [facility];  Service: Endoscopy;  Laterality: N/A;  7:30  . Maloney dilation N/A 07/02/2014    Procedure: Venia Minks DILATION;  Surgeon: Daneil Dolin, MD;  Location: [facility];  Service: Endoscopy;  Laterality: N/A;  7:30  . Portacath placement  06/2007    "  removed ~ 1 yr later"  . Cardiac catheterization  08/2001  . Coronary angioplasty with stent placement  2001; ~ 2006    "1 + 1"     Family History  Problem Relation Age of Onset  . Colon cancer Mother 32  . Colon cancer Sister 54  . Coronary artery disease Father   . Colon cancer Other     2 cousins, succumbed to illness    History   Social History  . Marital Status: Married    Spouse Name: N/A    Number of Children: 1  . Years of Education: N/A   Occupational History  .     Social History Main Topics  . Smoking status: Never Smoker   . Smokeless tobacco: Never Used  . Alcohol Use: No     Comment: Former user 45 years ago  . Drug Use: No  . Sexual Activity: No   Other Topics Concern  . Not on file   Social History Narrative  . No narrative on file    ROS: Extremely weak and tired See history of present illness otherwise negative  BP 98/62  Pulse 83  Ht 5\' 10"  (1.778 m)  Wt 213 lb (96.616 kg)  BMI 30.56 kg/m2  SpO2 95% patient is only mildly orthostatic see vitals for details  PHYSICAL EXAM: Well-nournished, in no acute distress. Neck: No JVD, HJR, Bruit, or thyroid enlargement  Lungs: Decreased breath sounds but No tachypnea, clear without wheezing, rales, or rhonchi  Cardiovascular: RRR, 1/6 systolic murmur at the left sternal border, no gallops, bruit, thrill, or heave.  Abdomen: BS normal. Soft without organomegaly, masses, lesions or tenderness.  Extremities: Bandaged left arm from fall,  otherwise lower extremities without cyanosis, clubbing or edema. Good distal pulses bilateral  SKin: Warm, no lesions or rashes   Musculoskeletal: No deformities  Neuro: no focal signs   Wt Readings from Last 3 Encounters:  07/19/14 210 lb 5.1 oz (95.4 kg)  07/02/14 210 lb (95.255 kg)  06/06/14 217 lb (98.431 kg)     EKG: Ventricular paced at 72 beats per minute  2-D echo March 2015: Study Conclusions  - Procedure narrative: Transthoracic echocardiography. Image   quality was suboptimal. The study was technically   difficult, as a result of poor sound wave transmission. - Left ventricle: Systolic function is severely reduced,   estimated EF 15-20%. Severe diffuse hypokinesis is noted.   The cavity size was moderately dilated. Wall thickness was   increased in a pattern of moderate LVH. Diastolic   dysfunction is seen, indeterminate grade. The apex was   poorly visualized. Doppler parameters are consistent with   high ventricular filling pressure. - Ventricular septum: Septal motion showed abnormal function   and dyssynergy. These changes are consistent with right   ventricular pacing. - Aortic valve: Trileaflet; mildly thickened leaflets. There   was no stenosis. - Mitral valve: Mildly thickened leaflets . Mild tethering   of leaflet motion due to severe left ventricular   dysfunction. Mild regurgitation. - Left atrium: The atrium was mildly to moderately dilated. - Right ventricle: The cavity size was normal. Wall   thickness was normal. Pacer wire or catheter noted in   right ventricle. Systolic function was mildly to   moderately reduced. - Right atrium: Pacer wire or catheter noted in right   atrium. - Atrial septum: The septum bowed from left to right,   consistent with increased left atrial pressure. - Tricuspid valve: Mild regurgitation. - Pulmonary  arteries: PA peak pressure: 36mm Hg (S). Mildly   elevated pulmonary pressures.   Diagnostic  Studies  07/2013 Cath   RESULTS: Left main coronary artery. The left main coronary artery is   free of significant disease.   Left anterior descending artery. The left anterior descending artery   gave rise to three septal perforators and two diagonal branches. These   and __________ were free of significant disease.   The circumflex artery: The circumflex artery gave rise to a ramus   branch, a marginal branch, and a small posterolateral branch. These   vessels were free of significant disease.   The right coronary artery: The right coronary artery was a moderate-   sized vessel that gave rise to posterior descending branch and three   posterolateral branches. The vessel also gave rise to a right   ventricular branch. There was an 80-90% stenosis at the ostium of the   right coronary artery. We could see some collateral filling from the   left coronary artery, which appeared to be right ventricular branches.   The left ventriculogram: The left ventriculogram performed in the RAO   projection showed good wall motion with no areas of hypokinesis. The   estimated ejection fraction was 60%.   The left ventricular pressure was 154/28 and __________ pressure was   154/86 with a mean of 116.   CONCLUSION: Coronary artery disease with 80-90% stenosis in the ostium   of the right coronary artery, no significant obstruction in the LAD and   circumflex arteries and normal LV function.   RECOMMENDATIONS: The patient has a tight what appears to be flow-   limiting lesion in the ostium of the right coronary artery. We will   plan to schedule the patient for to return for percutaneous coronary   intervention on Tuesday April 13. In the meantime, we will start Plavix   and continue aspirin and start the Toprol-XL 25 mg.

## 2014-07-24 NOTE — Patient Instructions (Signed)
Your physician recommends that you schedule a follow-up appointment with Dr. Harl Bowie before September 1st.  Your physician has recommended you make the following change in your medication:   STOP TAKING SPIRONOLACTONE   Continue all other currents medications  Thank you for choosing [facility]!!

## 2014-07-24 NOTE — Assessment & Plan Note (Signed)
Manuella Ghazi whether the patient had V. tach in the endoscopy suite. He does have an ICD that has not discharged. It was checked in June and he has another appointment for her device checked on 08/16/14. I will have him see Dr. branch in 2 weeks. Dr. Andree Elk in [place] on Monday.

## 2014-07-24 NOTE — Assessment & Plan Note (Signed)
In normal sinus rhythm

## 2014-07-25 DIAGNOSIS — C61 Malignant neoplasm of prostate: Secondary | ICD-10-CM | POA: Diagnosis not present

## 2014-07-29 DIAGNOSIS — I5022 Chronic systolic (congestive) heart failure: Secondary | ICD-10-CM | POA: Diagnosis not present

## 2014-07-29 DIAGNOSIS — R9431 Abnormal electrocardiogram [ECG] [EKG]: Secondary | ICD-10-CM | POA: Diagnosis not present

## 2014-07-29 DIAGNOSIS — Z950 Presence of cardiac pacemaker: Secondary | ICD-10-CM | POA: Diagnosis not present

## 2014-07-30 ENCOUNTER — Ambulatory Visit: Payer: Self-pay | Admitting: Pharmacist Clinician (PhC)/ Clinical Pharmacy Specialist

## 2014-07-30 DIAGNOSIS — Z7901 Long term (current) use of anticoagulants: Secondary | ICD-10-CM

## 2014-07-30 DIAGNOSIS — I480 Paroxysmal atrial fibrillation: Secondary | ICD-10-CM

## 2014-08-02 ENCOUNTER — Encounter: Payer: Self-pay | Admitting: Cardiology

## 2014-08-02 ENCOUNTER — Ambulatory Visit (INDEPENDENT_AMBULATORY_CARE_PROVIDER_SITE_OTHER): Payer: Medicare Other | Admitting: Cardiology

## 2014-08-02 VITALS — BP 82/54 | HR 89 | Ht 70.0 in | Wt 220.0 lb

## 2014-08-02 DIAGNOSIS — I4892 Unspecified atrial flutter: Secondary | ICD-10-CM

## 2014-08-02 DIAGNOSIS — I255 Ischemic cardiomyopathy: Secondary | ICD-10-CM

## 2014-08-02 DIAGNOSIS — I2589 Other forms of chronic ischemic heart disease: Secondary | ICD-10-CM

## 2014-08-02 DIAGNOSIS — I951 Orthostatic hypotension: Secondary | ICD-10-CM

## 2014-08-02 NOTE — Patient Instructions (Signed)
Continue all current medications. Follow up in  3-4 weeks

## 2014-08-02 NOTE — Progress Notes (Signed)
Clinical Summary Mr. Sleeth is a 58 y.o.male seen today for follow up of the following medical problems.   1. CAD/ICM  - prior BMS to LAD in 2001, repeat BMS to LAD to 2009. 08/2013 LVEF 30-35%. Echo 02/2014 LVEF 15-20%.  - he has BiV AICD placed by Dr Caryl Comes, followed by Dr Lovena Le  - last cath showed tight non-dominant RCA lesion 07/2013, this was elected for medical management.  - limiting salt intake, avoiding NSAIDs - he is followed in [facility] as well for CHF  - recently admitted with orthostatic hypotension, was given IVF and his beta blocker and ACE-I decreased. On last visit 07/24/14 he reported 2 falls, he however seemed to have continued taking his higher dose metoprolol at 50mg  bid as well as aldactone. Has not had any recent falls over the last week, only mild dizziness. - Last device check 05/2014 normal function, 84% afib with no ventricular arrhythmias.    2. Afib/Aflutter  - denies any palpitations  - compliant with eliquis, has had some bruising on his arms after falls.   3. Hyperlipidemia  - compliant with statin crestor 40mg  daily  - panel 07/2013 showed TC 182 TG 122 HDL 37 LDL 121     Past Medical History  Diagnosis Date  . Essential hypertension, benign   . Type 2 diabetes mellitus   . CAD (coronary artery disease)     a. BMS to LAD 2001 at [facility] b. PTCA/atherectomy ramus and BMS to LAD 2009  . Chronic systolic heart failure     a. 2D ECHO: 03/01/2014 EF 15-20%, severe, diffuse hypsokinesis. Mod LV dilation. Mod LVH, indeterminate diastolic dysfxn (poor study), high vent filling pressures, abnormal septal wall motion and dyssynergy c/w RV pacing, mild MR, mod LA dilation, RV systolic function mod reduced, mild TR, PA pressure 42.   Marland Kitchen GERD (gastroesophageal reflux disease)   . Cardiomyopathy, ischemic     a. BIV ICD St. Jude, LVEF 23%  . Paroxysmal atrial fibrillation     a. on amiodarone, digoxin and Eliquis  . Adenocarcinoma of rectum     a. 2008  .  Prostate cancer     a. s/p seed implants with chemo and radiation  . 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 St Jude  . TIA (transient ischemic attack)   . OSA on CPAP   . HLD (hyperlipidemia)   . Orthostatic hypotension   . Dizziness     a. chronic. Admission for this 07/18/2014     No Known Allergies   Current Outpatient Prescriptions  Medication Sig Dispense Refill  . amiodarone (PACERONE) 200 MG tablet Take 1 tablet (200 mg total) by mouth daily.  30 tablet  0  . apixaban (ELIQUIS) 5 MG TABS tablet Take 5 mg by mouth 2 (two) times daily.      Marland Kitchen aspirin 81 MG EC tablet Take 81 mg by mouth at bedtime.       . citalopram (CELEXA) 20 MG tablet Take 1 tablet (20 mg total) by mouth daily.  30 tablet  0  . co-enzyme Q-10 50 MG capsule Take 50 mg by mouth every morning.       . digoxin (LANOXIN) 0.125 MG tablet Take 1 tablet (0.125 mg total) by mouth every other day.  30 tablet  6  . enalapril (VASOTEC) 2.5 MG tablet Take 1 tablet (2.5 mg total) by mouth daily.  30 tablet  1  . fluticasone (FLONASE)  50 MCG/ACT nasal spray Place 1 spray into both nostrils daily as needed for allergies.       . furosemide (LASIX) 20 MG tablet Take 0.5 tablets (10 mg total) by mouth daily.  30 tablet  6  . metFORMIN (GLUCOPHAGE) 500 MG tablet Take 500 mg by mouth 2 (two) times daily with a meal.      . metoprolol succinate (TOPROL-XL) 25 MG 24 hr tablet Take 1 tablet (25 mg total) by mouth 2 (two) times daily.  60 tablet  11  . niacin (NIASPAN) 1000 MG CR tablet Take 1,000 mg by mouth at bedtime.        . nitroGLYCERIN (NITROLINGUAL) 0.4 MG/SPRAY spray Place 1 spray under the tongue every 5 (five) minutes as needed. angina  12 g  3  . Omega-3 Fatty Acids (FISH OIL) 1200 MG CAPS Take 1,200 mg by mouth 2 (two) times daily.        . pantoprazole (PROTONIX) 40 MG tablet Take 1 tablet (40 mg total) by mouth daily.  30 tablet  6  . ranolazine (RANEXA) 1000 MG SR tablet Take 1 tablet  (1,000 mg total) by mouth 2 (two) times daily.  60 tablet  9  . rosuvastatin (CRESTOR) 40 MG tablet Take 40 mg by mouth at bedtime.       No current facility-administered medications for this visit.     Past Surgical History  Procedure Laterality Date  . Internal defibrillator and pacemaker  2010    x2 St. Jude device  . Abdominal and perineal resection of rectum with total mesorectal excision  10/04/2007  . Colonoscopy      05/17/2007. IMPRESSION: Semilunar, apple-core neoplasm low in the rectum (palpable on digital rectal exam) beginning at 5 cm and corkscrewing up 5 cm in length. This was a low rectal lesion consistent with colorectal carcinoma. It was biosied multiple times. The upstream colon all the way to the cecum appeared normal. Recommendations: Followup on path. Surgical Consultation   . Colonoscopy  09/14/2011    Dr. Gala Romney: via colostomy, Single pedunculated benign inflammatory polyp. Due for surveillance Oct 2015  . Colostomy    . Colonoscopy N/A 07/02/2014    Procedure: COLONOSCOPY;  Surgeon: Daneil Dolin, MD;  [facility];  Service: Endoscopy;  Laterality: N/A;  7:30 / COLONOSCOPY THRU COLOSTOMY  . Esophagogastroduodenoscopy N/A 07/02/2014    Procedure: ESOPHAGOGASTRODUODENOSCOPY (EGD);  Surgeon: Daneil Dolin, MD;  [facility];  Service: Endoscopy;  Laterality: N/A;  7:30  . Savory dilation N/A 07/02/2014    Procedure: SAVORY DILATION;  Surgeon: Daneil Dolin, MD;  [facility];  Service: Endoscopy;  Laterality: N/A;  7:30  . Maloney dilation N/A 07/02/2014    Procedure: Venia Minks DILATION;  Surgeon: Daneil Dolin, MD;  [facility];  Service: Endoscopy;  Laterality: N/A;  7:30  . Portacath placement  06/2007    "removed ~ 1 yr later"  . Cardiac catheterization  08/2001  . Coronary angioplasty with stent placement  2001; ~ 2006    "1 + 1"      No Known Allergies    Family History  Problem Relation Age of Onset  . Colon  cancer Mother 56  . Colon cancer Sister 18  . Coronary artery disease Father   . Colon cancer Other     2 cousins, succumbed to illness     Social History Mr. Nydam reports that he has never smoked. He has  never used smokeless tobacco. Mr. Shepard reports that he does not drink alcohol.   Review of Systems CONSTITUTIONAL: No weight loss, fever, chills, weakness or fatigue.  HEENT: Eyes: No visual loss, blurred vision, double vision or yellow sclerae.No hearing loss, sneezing, congestion, runny nose or sore throat.  SKIN: No rash or itching.  CARDIOVASCULAR: per HPI RESPIRATORY: No shortness of breath, cough or sputum.  GASTROINTESTINAL: No anorexia, nausea, vomiting or diarrhea. No abdominal pain or blood.  GENITOURINARY: No burning on urination, no polyuria NEUROLOGICAL: dizziness MUSCULOSKELETAL: No muscle, back pain, joint pain or stiffness.  LYMPHATICS: No enlarged nodes. No history of splenectomy.  PSYCHIATRIC: No history of depression or anxiety.  ENDOCRINOLOGIC: No reports of sweating, cold or heat intolerance. No polyuria or polydipsia.  Marland Kitchen   Physical Examination p89 bp 85/50 Wt 220 lbs BMI 32 Gen: resting comfortably, no acute distress HEENT: no scleral icterus, pupils equal round and reactive, no palptable cervical adenopathy,  CV: RRR, no m/r/g,no JVD Resp: Clear to auscultation bilaterally GI: abdomen is soft, non-tender, non-distended, normal bowel sounds, no hepatosplenomegaly MSK: extremities are warm, no edema.  Skin: warm, no rash Neuro:  no focal deficits Psych: appropriate affect   Diagnostic Studies 08/2013 Echo  LVEF 30-35%, + WMAs, grade I diastolic dysfunction, mild MR,  07/2013 Cath  RESULTS: Left main coronary artery. The left main coronary artery is  free of significant disease.  Left anterior descending artery. The left anterior descending artery  gave rise to three septal perforators and two diagonal branches. These  and __________ were free  of significant disease.  The circumflex artery: The circumflex artery gave rise to a ramus  Lashon Hillier, a marginal Samanvitha Germany, and a small posterolateral Daviel Allegretto. These  vessels were free of significant disease.  The right coronary artery: The right coronary artery was a moderate-  sized vessel that gave rise to posterior descending Sundance Moise and three  posterolateral branches. The vessel also gave rise to a right  ventricular Meliyah Simon. There was an 80-90% stenosis at the ostium of the  right coronary artery. We could see some collateral filling from the  left coronary artery, which appeared to be right ventricular branches.  The left ventriculogram: The left ventriculogram performed in the RAO  projection showed good wall motion with no areas of hypokinesis. The  estimated ejection fraction was 60%.  The left ventricular pressure was 154/28 and __________ pressure was  154/86 with a mean of 116.  CONCLUSION: Coronary artery disease with 80-90% stenosis in the ostium  of the right coronary artery, no significant obstruction in the LAD and  circumflex arteries and normal LV function.  RECOMMENDATIONS: The patient has a tight what appears to be flow-  limiting lesion in the ostium of the right coronary artery. We will  plan to schedule the patient for to return for percutaneous coronary  intervention on Tuesday April 13. In the meantime, we will start Plavix  and continue aspirin and start the Toprol-XL 25 mg.  02/2014 Echo Study Conclusions  - Procedure narrative: Transthoracic echocardiography. Image quality was suboptimal. The study was technically difficult, as a result of poor sound wave transmission. - Left ventricle: Systolic function is severely reduced, estimated EF 15-20%. Severe diffuse hypokinesis is noted. The cavity size was moderately dilated. Wall thickness was increased in a pattern of moderate LVH. Diastolic dysfunction is seen, indeterminate grade. The apex was poorly visualized.  Doppler parameters are consistent with high ventricular filling pressure. - Ventricular septum: Septal motion showed abnormal function  and dyssynergy. These changes are consistent with right ventricular pacing. - Aortic valve: Trileaflet; mildly thickened leaflets. There was no stenosis. - Mitral valve: Mildly thickened leaflets . Mild tethering of leaflet motion due to severe left ventricular dysfunction. Mild regurgitation. - Left atrium: The atrium was mildly to moderately dilated. - Right ventricle: The cavity size was normal. Wall thickness was normal. Pacer wire or catheter noted in right ventricle. Systolic function was mildly to moderately reduced. - Right atrium: Pacer wire or catheter noted in right atrium. - Atrial septum: The septum bowed from left to right, consistent with increased left atrial pressure. - Tricuspid valve: Mild regurgitation. - Pulmonary arteries: PA peak pressure: 89mm Hg (S). Mildly elevated pulmonary pressures.     Assessment and Plan  1.CAD/ICM  - LVEF 15-20% by echo 02/2014, NYHA III, he has a BiV AICD  - appears euvolemic today  - medical therapy has been limited due to low blood pressures, dizziness, and recent falls - Toprol and enalapril recently decreased, symptoms of dizziness improved though still with some mild symptoms - continue current therapy at this time with Toprol XL 25mg  bid and enalapril 2.5mg  qday, he is taking lasix only as needed. If recurrent issues with dizziness/falls next step would be to cut Toprol XL to 12.5mg  bid.  - reports going to [facility] this week for a study that sounds like BiV optimization, will continue to follow updates in [place].   2. Hyperlipidemia  - continue crestor 40, needs repeat panel in next few monhts   3. Afib /aflutter  - no current symptoms, continue ecurrent meds - discussed possible ablation with EP last visit for flutter, currently patient is not interested.    F/u 1 month.  Asked to contact us if dizziness progresses    Arnoldo Lenis, M.D., F.A.C.C.

## 2014-08-06 ENCOUNTER — Encounter: Payer: Self-pay | Admitting: Gastroenterology

## 2014-08-06 ENCOUNTER — Ambulatory Visit (INDEPENDENT_AMBULATORY_CARE_PROVIDER_SITE_OTHER): Payer: Medicare Other | Admitting: Gastroenterology

## 2014-08-06 ENCOUNTER — Other Ambulatory Visit: Payer: Self-pay | Admitting: Gastroenterology

## 2014-08-06 VITALS — BP 100/62 | HR 79 | Temp 97.0°F | Ht 70.0 in | Wt 218.4 lb

## 2014-08-06 DIAGNOSIS — K824 Cholesterolosis of gallbladder: Secondary | ICD-10-CM | POA: Diagnosis not present

## 2014-08-06 DIAGNOSIS — K219 Gastro-esophageal reflux disease without esophagitis: Secondary | ICD-10-CM

## 2014-08-06 DIAGNOSIS — C20 Malignant neoplasm of rectum: Secondary | ICD-10-CM

## 2014-08-06 DIAGNOSIS — I2589 Other forms of chronic ischemic heart disease: Secondary | ICD-10-CM

## 2014-08-06 NOTE — Progress Notes (Signed)
Referring Provider: Redmond School, MD Primary Care Physician:  Glo Herring., MD Primary GI: Dr. Gala Romney   Chief Complaint  Patient presents with  . Follow-up    needs procedure    HPI:   [name] presents today to reschedule colonoscopy and EGD. Past history significant for stage III rectal carcinoma, diagnosed in 2008, s/p APR/chemotherapy radiation therapy. Last colonoscopy in 2012 with single pedunculated benign inflammatory polyp. Due for surveillance this year. Presents today to discuss EGD to rule out occult process due to esophageal thickening incidentally noted on recent CT. At time of appointment in July for procedure, was direct admit due to afib, orthostasis. Last seen by Dr. Harl Bowie 8/28. On Eliquis.   Denies dysphagia. No melena or hematochezia. No constipation or diarrhea. Taking Protonix once daily. Changed diet, which helped with breakthrough reflux.   Says cardiologist in [facility] is concerned about GI procedures due to ejection fraction. Feels defibrillator is not working correctly.   [place] of abdomen in past with possible gallbladder polyp. Needs surveillance now.   Past Medical History  Diagnosis Date  . Essential hypertension, benign   . Type 2 diabetes mellitus   . CAD (coronary artery disease)     a. BMS to LAD 2001 at [facility] b. PTCA/atherectomy ramus and BMS to LAD 2009  . Chronic systolic heart failure     a. 2D ECHO: 03/01/2014 EF 15-20%, severe, diffuse hypsokinesis. Mod LV dilation. Mod LVH, indeterminate diastolic dysfxn (poor study), high vent filling pressures, abnormal septal wall motion and dyssynergy c/w RV pacing, mild MR, mod LA dilation, RV systolic function mod reduced, mild TR, PA pressure 42.   Marland Kitchen GERD (gastroesophageal reflux disease)   . Cardiomyopathy, ischemic     a. BIV ICD St. Jude, LVEF 23%  . Paroxysmal atrial fibrillation     a. on amiodarone, digoxin and Eliquis  . Adenocarcinoma of rectum     a. 2008  . Prostate cancer      a. s/p seed implants with chemo and radiation  . 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 St Jude  . TIA (transient ischemic attack)   . OSA on CPAP   . HLD (hyperlipidemia)   . Orthostatic hypotension   . Dizziness     a. chronic. Admission for this 07/18/2014    Past Surgical History  Procedure Laterality Date  . Internal defibrillator and pacemaker  2010    x2 St. Jude device  . Abdominal and perineal resection of rectum with total mesorectal excision  10/04/2007  . Colonoscopy      05/17/2007. IMPRESSION: Semilunar, apple-core neoplasm low in the rectum (palpable on digital rectal exam) beginning at 5 cm and corkscrewing up 5 cm in length. This was a low rectal lesion consistent with colorectal carcinoma. It was biosied multiple times. The upstream colon all the way to the cecum appeared normal. Recommendations: Followup on path. Surgical Consultation   . Colonoscopy  09/14/2011    Dr. Gala Romney: via colostomy, Single pedunculated benign inflammatory polyp. Due for surveillance Oct 2015  . Colostomy    . Colonoscopy N/A 07/02/2014    Procedure: COLONOSCOPY;  Surgeon: Daneil Dolin, MD;  [facility];  Service: Endoscopy;  Laterality: N/A;  7:30 / COLONOSCOPY THRU COLOSTOMY  . Esophagogastroduodenoscopy N/A 07/02/2014    Procedure: ESOPHAGOGASTRODUODENOSCOPY (EGD);  Surgeon: Daneil Dolin, MD;  [facility];  Service: Endoscopy;  Laterality: N/A;  7:30  . Savory dilation  N/A 07/02/2014    Procedure: SAVORY DILATION;  Surgeon: Daneil Dolin, MD;  [facility];  Service: Endoscopy;  Laterality: N/A;  7:30  . Maloney dilation N/A 07/02/2014    Procedure: Venia Minks DILATION;  Surgeon: Daneil Dolin, MD;  [facility];  Service: Endoscopy;  Laterality: N/A;  7:30  . Portacath placement  06/2007    "removed ~ 1 yr later"  . Cardiac catheterization  08/2001  . Coronary angioplasty with stent placement  2001; ~ 2006    "1 + 1"      Current Outpatient Prescriptions  Medication Sig Dispense Refill  . amiodarone (PACERONE) 200 MG tablet Take 1 tablet (200 mg total) by mouth daily.  30 tablet  0  . apixaban (ELIQUIS) 5 MG TABS tablet Take 5 mg by mouth 2 (two) times daily.      Marland Kitchen aspirin 81 MG EC tablet Take 81 mg by mouth at bedtime.       . citalopram (CELEXA) 20 MG tablet Take 1 tablet (20 mg total) by mouth daily.  30 tablet  0  . co-enzyme Q-10 50 MG capsule Take 50 mg by mouth every morning.       . digoxin (LANOXIN) 0.125 MG tablet Take 1 tablet (0.125 mg total) by mouth every other day.  30 tablet  6  . enalapril (VASOTEC) 2.5 MG tablet Take 1 tablet (2.5 mg total) by mouth daily.  30 tablet  1  . fluticasone (FLONASE) 50 MCG/ACT nasal spray Place 1 spray into both nostrils daily as needed for allergies.       Marland Kitchen meclizine (ANTIVERT) 25 MG tablet Take 25 mg by mouth as needed for dizziness.      . metFORMIN (GLUCOPHAGE) 500 MG tablet Take 500 mg by mouth 2 (two) times daily with a meal.      . metoprolol succinate (TOPROL-XL) 25 MG 24 hr tablet Take 1 tablet (25 mg total) by mouth 2 (two) times daily.  60 tablet  11  . niacin (NIASPAN) 1000 MG CR tablet Take 1,000 mg by mouth at bedtime.        . nitroGLYCERIN (NITROLINGUAL) 0.4 MG/SPRAY spray Place 1 spray under the tongue every 5 (five) minutes as needed. angina  12 g  3  . Omega-3 Fatty Acids (FISH OIL) 1200 MG CAPS Take 1,200 mg by mouth 2 (two) times daily.        . pantoprazole (PROTONIX) 40 MG tablet Take 1 tablet (40 mg total) by mouth daily.  30 tablet  6  . ranolazine (RANEXA) 1000 MG SR tablet Take 1 tablet (1,000 mg total) by mouth 2 (two) times daily.  60 tablet  9  . rosuvastatin (CRESTOR) 40 MG tablet Take 40 mg by mouth at bedtime.      . isosorbide dinitrate (ISORDIL) 30 MG tablet daily.       No current facility-administered medications for this visit.    Allergies as of 08/06/2014  . (No Known Allergies)    Family History  Problem  Relation Age of Onset  . Colon cancer Mother 52  . Colon cancer Sister 3  . Coronary artery disease Father   . Colon cancer Other     2 cousins, succumbed to illness    History   Social History  . Marital Status: Married    Spouse Name: N/A    Number of Children: 1  . Years of Education: N/A   Occupational History  .  Social History Main Topics  . Smoking status: Never Smoker   . Smokeless tobacco: Never Used  . Alcohol Use: No     Comment: Former user 45 years ago  . Drug Use: No  . Sexual Activity: No   Other Topics Concern  . None   Social History Narrative  . None    Review of Systems: As mentioned in HPI  Physical Exam: BP 100/62  Pulse 79  Temp(Src) 97 F (36.1 C) (Oral)  Ht 5\' 10"  (1.778 m)  Wt 218 lb 6.4 oz (99.066 kg)  BMI 31.34 kg/m2 General:   Alert and oriented. No distress noted. Pleasant and cooperative.  Head:  Normocephalic and atraumatic. Eyes:  Conjuctiva clear without scleral icterus. Mouth:  Oral mucosa pink and moist.  Heart:  S1, S2 present  Abdomen:  +BS, soft, non-tender and non-distended. LLQ ostomy bag intact. Liver margin palpable below right subcostal margin Msk:  Symmetrical without gross deformities. Normal posture. Extremities:  Without edema. Neurologic:  Alert and  oriented x4;  grossly normal neurologically. Skin:  Intact without significant lesions or rashes. Psych:  Alert and cooperative. Normal mood and affect.  CT June 2015:  Mild thickening of distal esophagus  Lab Results  Component Value Date   ALT 31 05/22/2014   AST 48* 05/22/2014   ALKPHOS 55 05/22/2014   BILITOT 1.1 05/22/2014

## 2014-08-06 NOTE — Assessment & Plan Note (Signed)
On [place] in April 2015. Query hepatomegaly on exam. [place] of abdomen for gallbladder polyp surveillance now and assessment of liver.

## 2014-08-06 NOTE — Assessment & Plan Note (Addendum)
Diagnosed in 2008, s/p APR with last colonoscopy in 2012 with single pedunculated benign inflammatory polyp. Due for high risk surveillance now; both mother and sister with history of colon cancer. No overt signs of GI bleeding or concerns for significant changes in bowel habits. Due to extensive cardiac history, I would like to obtain cardiac clearance from [facility] prior to elective colonoscopy/EGD. Patient states he will be seen on 9/2. I have provided this office note to take with him.   When cleared, proceed with TCS (VIA COLOSTOMY) with Dr. Gala Romney the risks, benefits, and alternatives have been discussed with the patient in detail. The patient states understanding and desires to proceed. Has pacemaker/defibrillator HOLD ELIQUIS X 2 days before procedure

## 2014-08-06 NOTE — Assessment & Plan Note (Signed)
Chronic, with improvement in symptoms after dietary changes. Continue Protonix daily. Incidental finding of esophageal wall thickening on CT in June 2015. Likely secondary to chronic GERd but unable to rule out Barrett's, occult malignancy.   Proceed with upper endoscopy in the near future with Dr. Gala Romney after cardiac clearance. The risks, benefits, and alternatives have been discussed in detail with patient. They have stated understanding and desire to proceed.  HOLD ELIQUIS X 2 days prior Continue Protonix

## 2014-08-06 NOTE — Patient Instructions (Signed)
We have scheduled you for an ultrasound of your belly.  I would like for you to get clearance from your cardiologist at [facility] before we proceed with a colonoscopy and upper endoscopy.

## 2014-08-07 DIAGNOSIS — Z79899 Other long term (current) drug therapy: Secondary | ICD-10-CM | POA: Diagnosis not present

## 2014-08-07 DIAGNOSIS — I5022 Chronic systolic (congestive) heart failure: Secondary | ICD-10-CM | POA: Diagnosis not present

## 2014-08-07 NOTE — Progress Notes (Signed)
Cc to pcp °

## 2014-08-14 ENCOUNTER — Ambulatory Visit (HOSPITAL_COMMUNITY): Admission: RE | Admit: 2014-08-14 | Payer: Medicare Other | Source: Ambulatory Visit

## 2014-08-15 ENCOUNTER — Encounter: Payer: Self-pay | Admitting: Internal Medicine

## 2014-08-16 ENCOUNTER — Ambulatory Visit (INDEPENDENT_AMBULATORY_CARE_PROVIDER_SITE_OTHER): Payer: Medicare Other | Admitting: *Deleted

## 2014-08-16 DIAGNOSIS — I5023 Acute on chronic systolic (congestive) heart failure: Secondary | ICD-10-CM | POA: Diagnosis not present

## 2014-08-16 DIAGNOSIS — I2589 Other forms of chronic ischemic heart disease: Secondary | ICD-10-CM

## 2014-08-16 LAB — MDC_IDC_ENUM_SESS_TYPE_INCLINIC
Date Time Interrogation Session: 20150911082418
HIGH POWER IMPEDANCE MEASURED VALUE: 46.4304
HighPow Impedance: 46 Ohm
Lead Channel Impedance Value: 400 Ohm
Lead Channel Impedance Value: 412.5 Ohm
Lead Channel Impedance Value: 600 Ohm
Lead Channel Pacing Threshold Amplitude: 0.75 V
Lead Channel Pacing Threshold Amplitude: 0.75 V
Lead Channel Pacing Threshold Pulse Width: 0.5 ms
Lead Channel Pacing Threshold Pulse Width: 0.5 ms
Lead Channel Pacing Threshold Pulse Width: 0.5 ms
Lead Channel Sensing Intrinsic Amplitude: 4.4 mV
Lead Channel Sensing Intrinsic Amplitude: 9.8 mV
Lead Channel Setting Pacing Amplitude: 2 V
Lead Channel Setting Pacing Amplitude: 2.5 V
Lead Channel Setting Pacing Pulse Width: 0.5 ms
Lead Channel Setting Pacing Pulse Width: 0.5 ms
Lead Channel Setting Sensing Sensitivity: 0.3 mV
MDC IDC MSMT BATTERY REMAINING LONGEVITY: 22.8 mo
MDC IDC MSMT BATTERY VOLTAGE: 2.56 V
MDC IDC MSMT LEADCHNL RV PACING THRESHOLD AMPLITUDE: 1.25 V
MDC IDC MSMT LEADCHNL RV PACING THRESHOLD AMPLITUDE: 1.25 V
MDC IDC MSMT LEADCHNL RV PACING THRESHOLD PULSEWIDTH: 0.5 ms
MDC IDC PG SERIAL: 668098
MDC IDC SET LEADCHNL LV PACING AMPLITUDE: 2 V
MDC IDC STAT BRADY RA PERCENT PACED: 0.01 %
MDC IDC STAT BRADY RV PERCENT PACED: 72 %
Zone Setting Detection Interval: 270 ms
Zone Setting Detection Interval: 320 ms
Zone Setting Detection Interval: 400 ms

## 2014-08-16 NOTE — Progress Notes (Signed)
Bi V ICD check in office. 

## 2014-08-27 ENCOUNTER — Encounter: Payer: Self-pay | Admitting: Cardiovascular Disease

## 2014-08-29 NOTE — Progress Notes (Signed)
Was patient cleared by cardiology at [facility] for colonoscopy?

## 2014-08-30 ENCOUNTER — Telehealth: Payer: Self-pay | Admitting: Oncology

## 2014-08-30 ENCOUNTER — Ambulatory Visit (HOSPITAL_BASED_OUTPATIENT_CLINIC_OR_DEPARTMENT_OTHER): Payer: Medicare Other | Admitting: Oncology

## 2014-08-30 ENCOUNTER — Encounter: Payer: Self-pay | Admitting: Oncology

## 2014-08-30 ENCOUNTER — Other Ambulatory Visit (HOSPITAL_BASED_OUTPATIENT_CLINIC_OR_DEPARTMENT_OTHER): Payer: Medicare Other

## 2014-08-30 ENCOUNTER — Telehealth: Payer: Self-pay | Admitting: *Deleted

## 2014-08-30 VITALS — BP 85/60 | HR 78 | Temp 97.5°F | Resp 18 | Ht 70.0 in | Wt 219.9 lb

## 2014-08-30 DIAGNOSIS — E119 Type 2 diabetes mellitus without complications: Secondary | ICD-10-CM

## 2014-08-30 DIAGNOSIS — Z85038 Personal history of other malignant neoplasm of large intestine: Secondary | ICD-10-CM

## 2014-08-30 DIAGNOSIS — C20 Malignant neoplasm of rectum: Secondary | ICD-10-CM

## 2014-08-30 DIAGNOSIS — Z23 Encounter for immunization: Secondary | ICD-10-CM | POA: Diagnosis not present

## 2014-08-30 DIAGNOSIS — I959 Hypotension, unspecified: Secondary | ICD-10-CM | POA: Diagnosis not present

## 2014-08-30 DIAGNOSIS — I2589 Other forms of chronic ischemic heart disease: Secondary | ICD-10-CM | POA: Diagnosis not present

## 2014-08-30 LAB — CEA: CEA: 1.8 ng/mL (ref 0.0–5.0)

## 2014-08-30 MED ORDER — INFLUENZA VAC SPLIT QUAD 0.5 ML IM SUSY
0.5000 mL | PREFILLED_SYRINGE | Freq: Once | INTRAMUSCULAR | Status: AC
Start: 2014-08-30 — End: 2014-08-30
  Administered 2014-08-30: 0.5 mL via INTRAMUSCULAR
  Filled 2014-08-30: qty 0.5

## 2014-08-30 NOTE — Telephone Encounter (Signed)
[facility] PT APPT SCHEDULE FOR SEPT 2016. POF FOR 9/25 DOES NOT SPECIFY WHEN PT IS TO RETURN FOR F/U. PER PT 9YR AND LAB ALSO DATED FOR 9YR. PT GIVEN LB/FU FOR ONE YEAR - MESSAGE TO BS TO CONFIRM.

## 2014-08-30 NOTE — Progress Notes (Signed)
  [place] OFFICE PROGRESS NOTE   Diagnosis: Rectal cancer  INTERVAL HISTORY:   Mr. Mendolia returns as scheduled. He reports difficulty with "dizziness "secondary to hypotension for several months. He reports multiple falls. He was admitted in August. His medications have been adjusted and he reports no falls over the past month. He is due for a colonoscopy. He was scheduled for a colonoscopy 07/03/1999 pain, but had tachycardia and hypotension and the procedure was placed on hold.  Objective:  Vital signs in last 24 hours:  Blood pressure 85/60, pulse 78, temperature 97.5 F (36.4 C), temperature source Oral, resp. rate 18, height 5\' 10"  (1.778 m), weight 219 lb 14.4 oz (99.746 kg), SpO2 100.00%.    HEENT: Neck without mass Lymphatics: No cervical, supraclavicular, axillary, or inguinal nodes, prominent bilateral axillary fat pads Resp: Lungs clear bilaterally Cardio: Irregular, distant heart sounds, left upper chest defibrillator GI: No hepatosplenomegaly, nontender, no mass, left lower quadrant colostomy Vascular: No leg edema  Skin: Perineal scar without evidence of recurrent tumor, multiple ecchymoses and abrasions over the arm     Lab Results:   Lab Results  Component Value Date   CEA 1.6 02/22/2014    Medications: I have reviewed the patient's current medications.  Assessment/Plan: 1. Stage III rectal cancer, diagnosed in June of 2008: Status post neoadjuvant infusional 5-FU and concurrent radiation. He underwent an APR 10/04/2007 with the pathology confirming stage III disease. He completed 8 cycles of adjuvant FOLFOX therapy on 03/12/2008. A restaging CT 05/26/2010 showed no evidence of metastatic disease. He underwent a colonoscopy in October of 2012 with removal of a single pedunculated polyp-benign pathology 2. Prostate cancer: Status post radiation and 2 years of Lupron therapy per Dr. Terance Hart. 3. History of thrombocytopenia secondary to  chemotherapy.  4. History of delayed nausea secondary to chemotherapy: Improved with Aloxi. 5. History of oxaliplatin neuropathy. 6. Ischemic cardiomyopathy followed by Dr. Rollene Fare. 7. History of bilateral axillary fullness. 8. Diabetes. 9. Status post Port-A-Cath removal. 10. Hospitalization with pneumonia October 2012. 11. Question small bilateral inguinal lymph nodes on exam 04/17/2013-? Small left inguinal node on exam 07/17/2013. No lymph nodes noted on exam today  12. Dizziness secondary to hypotension-followed by cardiology.   Disposition:  Mr. Bless remains in clinical remission from rectal cancer. He would like to continue followup at the [facility]. He will return for an office visit and CEA in one year. He continues close followup with cardiology for management of cardiomyopathy and hypotension.  He received an influenza vaccine today.  [name], MD  08/30/2014  10:26 AM

## 2014-08-30 NOTE — Telephone Encounter (Signed)
Message copied by Brien Few on Fri Aug 30, 2014  4:34 PM ------      Message from: Ladell Pier      Created: Fri Aug 30, 2014  4:02 PM       Please call patient, cea is normal ------

## 2014-08-30 NOTE — Patient Instructions (Signed)

## 2014-09-03 NOTE — Telephone Encounter (Signed)
Left message on voicemail, lab results normal.

## 2014-09-09 ENCOUNTER — Telehealth: Payer: Self-pay | Admitting: Internal Medicine

## 2014-09-09 NOTE — Telephone Encounter (Signed)
Yes, proceed with US abdomen.

## 2014-09-09 NOTE — Telephone Encounter (Signed)
Ginger, pts wife said it was ok to schedule U/S.

## 2014-09-09 NOTE — Telephone Encounter (Signed)
PATIENT WIFE CALLED STATING THAT HE IS IN PAIN AFTER EATING.  HAS NOT BEEN TO PRIMARY CARE OR ER.  PLEASE ADVISE [id number]    WAS SEEN HERE SEPT 2015

## 2014-09-09 NOTE — Progress Notes (Signed)
Per pts wife- Pt went to [facility] and was told to wait on scheduling procedures until he saw Dr. Harl Bowie. Pt has ov with Dr.Branch on 09/10/14

## 2014-09-09 NOTE — Telephone Encounter (Signed)
Spoke with the pts wife, pt is having pain and discomfort on his R side,  after he eats. He did not have the abd U/S done because he was in the hospital and did not reschedule it.   Vicente Males, Is it ok to reschedule U/S?   Also , he went to [facility] to see cardiologist and they told him to wait on the procedures until he saw Dr. Harl Bowie here in [place]. He has an appt to see Dr.Branch tomorrow and then they will decide if he can have tcs/egd done.

## 2014-09-10 ENCOUNTER — Encounter: Payer: Self-pay | Admitting: Cardiology

## 2014-09-10 ENCOUNTER — Ambulatory Visit (INDEPENDENT_AMBULATORY_CARE_PROVIDER_SITE_OTHER): Payer: 59 | Admitting: Cardiology

## 2014-09-10 VITALS — BP 92/66 | HR 116 | Ht 70.0 in | Wt 218.1 lb

## 2014-09-10 DIAGNOSIS — I2510 Atherosclerotic heart disease of native coronary artery without angina pectoris: Secondary | ICD-10-CM

## 2014-09-10 DIAGNOSIS — E785 Hyperlipidemia, unspecified: Secondary | ICD-10-CM

## 2014-09-10 DIAGNOSIS — I4892 Unspecified atrial flutter: Secondary | ICD-10-CM

## 2014-09-10 DIAGNOSIS — I5022 Chronic systolic (congestive) heart failure: Secondary | ICD-10-CM

## 2014-09-10 NOTE — Progress Notes (Signed)
Clinical Summary [name] is a 58 y.o.male seen today for follow up of the following medical problems.   1. CAD/ICM  - prior BMS to LAD in 2001, repeat BMS to LAD to 2009. 08/2013 LVEF 30-35%. Echo 02/2014 LVEF 15-20%.  - he has BiV AICD placed by Dr Caryl Comes, followed by Dr Lovena Le. Last check 08/16/14 with normal function, no ventricular arrhythmias. BiV paced >72% of time.  - last cath showed tight non-dominant RCA lesion 07/2013, this was elected for medical management.  - limiting salt intake, avoiding NSAIDs  - he is followed in [facility] as well for CHF, last visit 07/2014.  - medical therapy had been scaled back recently due to problems with orthostatic dizziness and falls, he is currently tolerating his current regimen and actually denies any dizziness for a month. Agree with [facility] notes that he was likely hypovolemic around that time, symptoms improved on only aldactone as a diuretic, though he has lasix if needed prn  2. Afib/Aflutter  - denies any palpitations  - compliant with eliquis, has had some bruising on his arms after falls but have since resolved.   3. Hyperlipidemia  - compliant with statin crestor 40mg  daily   Past Medical History  Diagnosis Date  . Essential hypertension, benign   . Type 2 diabetes mellitus   . CAD (coronary artery disease)     a. BMS to LAD 2001 at [facility] b. PTCA/atherectomy ramus and BMS to LAD 2009  . Chronic systolic heart failure     a. 2D ECHO: 03/01/2014 EF 15-20%, severe, diffuse hypsokinesis. Mod LV dilation. Mod LVH, indeterminate diastolic dysfxn (poor study), high vent filling pressures, abnormal septal wall motion and dyssynergy c/w RV pacing, mild MR, mod LA dilation, RV systolic function mod reduced, mild TR, PA pressure 42.   Marland Kitchen GERD (gastroesophageal reflux disease)   . Cardiomyopathy, ischemic     a. BIV ICD St. Jude, LVEF 23%  . Paroxysmal atrial fibrillation     a. on amiodarone, digoxin and Eliquis  . Adenocarcinoma of rectum      a. 2008  . Prostate cancer     a. s/p seed implants with chemo and radiation  . 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 St Jude  . TIA (transient ischemic attack)   . OSA on CPAP   . HLD (hyperlipidemia)   . Orthostatic hypotension   . Dizziness     a. chronic. Admission for this 07/18/2014  . History of falling July 2015    due to dizziness from medications     No Known Allergies   Current Outpatient Prescriptions  Medication Sig Dispense Refill  . amiodarone (PACERONE) 200 MG tablet Take 1 tablet (200 mg total) by mouth daily.  30 tablet  0  . apixaban (ELIQUIS) 5 MG TABS tablet Take 5 mg by mouth 2 (two) times daily.      Marland Kitchen aspirin 81 MG EC tablet Take 81 mg by mouth at bedtime.       . citalopram (CELEXA) 20 MG tablet Take 1 tablet (20 mg total) by mouth daily.  30 tablet  0  . co-enzyme Q-10 50 MG capsule Take 50 mg by mouth every morning.       . digoxin (LANOXIN) 0.125 MG tablet Take 1 tablet (0.125 mg total) by mouth every other day.  30 tablet  6  . enalapril (VASOTEC) 2.5 MG tablet Take 1 tablet (2.5 mg total) by mouth  daily.  30 tablet  1  . fluticasone (FLONASE) 50 MCG/ACT nasal spray Place 1 spray into both nostrils daily as needed for allergies.       . isosorbide dinitrate (ISORDIL) 30 MG tablet Take 15 mg by mouth 2 (two) times daily.       . meclizine (ANTIVERT) 25 MG tablet Take 25 mg by mouth as needed for dizziness.      . metFORMIN (GLUCOPHAGE) 500 MG tablet Take 500 mg by mouth 2 (two) times daily with a meal.      . metoprolol succinate (TOPROL-XL) 25 MG 24 hr tablet Take 1 tablet (25 mg total) by mouth 2 (two) times daily.  60 tablet  11  . niacin (NIASPAN) 1000 MG CR tablet Take 1,000 mg by mouth at bedtime.        . nitroGLYCERIN (NITROLINGUAL) 0.4 MG/SPRAY spray Place 1 spray under the tongue every 5 (five) minutes as needed. angina  12 g  3  . Omega-3 Fatty Acids (FISH OIL) 1200 MG CAPS Take 1,200 mg by mouth 2 (two) times  daily.        . pantoprazole (PROTONIX) 40 MG tablet Take 1 tablet (40 mg total) by mouth daily.  30 tablet  6  . ranolazine (RANEXA) 1000 MG SR tablet Take 1 tablet (1,000 mg total) by mouth 2 (two) times daily.  60 tablet  9  . rosuvastatin (CRESTOR) 40 MG tablet Take 40 mg by mouth at bedtime.       No current facility-administered medications for this visit.     Past Surgical History  Procedure Laterality Date  . Internal defibrillator and pacemaker  2010    x2 St. Jude device  . Abdominal and perineal resection of rectum with total mesorectal excision  10/04/2007  . Colonoscopy      05/17/2007. IMPRESSION: Semilunar, apple-core neoplasm low in the rectum (palpable on digital rectal exam) beginning at 5 cm and corkscrewing up 5 cm in length. This was a low rectal lesion consistent with colorectal carcinoma. It was biosied multiple times. The upstream colon all the way to the cecum appeared normal. Recommendations: Followup on path. Surgical Consultation   . Colonoscopy  09/14/2011    Dr. Gala Romney: via colostomy, Single pedunculated benign inflammatory polyp. Due for surveillance Oct 2015  . Colostomy    . Colonoscopy N/A 07/02/2014    Procedure: COLONOSCOPY;  Surgeon: Daneil Dolin, MD;  Location: [facility];  Service: Endoscopy;  Laterality: N/A;  7:30 / COLONOSCOPY THRU COLOSTOMY  . Esophagogastroduodenoscopy N/A 07/02/2014    Procedure: ESOPHAGOGASTRODUODENOSCOPY (EGD);  Surgeon: Daneil Dolin, MD;  Location: [facility];  Service: Endoscopy;  Laterality: N/A;  7:30  . Savory dilation N/A 07/02/2014    Procedure: SAVORY DILATION;  Surgeon: Daneil Dolin, MD;  Location: [facility];  Service: Endoscopy;  Laterality: N/A;  7:30  . Maloney dilation N/A 07/02/2014    Procedure: Venia Minks DILATION;  Surgeon: Daneil Dolin, MD;  Location: [facility];  Service: Endoscopy;  Laterality: N/A;  7:30  . Portacath placement  06/2007    "removed ~ 1 yr later"  . Cardiac catheterization   08/2001  . Coronary angioplasty with stent placement  2001; ~ 2006    "1 + 1"      No Known Allergies    Family History  Problem Relation Age of Onset  . Colon cancer Mother 29  . Colon cancer Sister 76  . Coronary artery disease Father   .  Colon cancer Other     2 cousins, succumbed to illness     Social History Mr. Lowden reports that he has never smoked. He has never used smokeless tobacco. Mr. Frisbie reports that he does not drink alcohol.   Review of Systems CONSTITUTIONAL: No weight loss, fever, chills, weakness or fatigue.  HEENT: Eyes: No visual loss, blurred vision, double vision or yellow sclerae.No hearing loss, sneezing, congestion, runny nose or sore throat.  SKIN: No rash or itching.  CARDIOVASCULAR: per HPI RESPIRATORY: No shortness of breath, cough or sputum.  GASTROINTESTINAL: No anorexia, nausea, vomiting or diarrhea. No abdominal pain or blood.  GENITOURINARY: No burning on urination, no polyuria NEUROLOGICAL: No headache, dizziness, syncope, paralysis, ataxia, numbness or tingling in the extremities. No change in bowel or bladder control.  MUSCULOSKELETAL: No muscle, back pain, joint pain or stiffness.  LYMPHATICS: No enlarged nodes. No history of splenectomy.  PSYCHIATRIC: No history of depression or anxiety.  ENDOCRINOLOGIC: No reports of sweating, cold or heat intolerance. No polyuria or polydipsia.  Marland Kitchen   Physical Examination p 98 bp 92/66 Wt 218 lbs BMI 31 Gen: resting comfortably, no acute distress HEENT: no scleral icterus, pupils equal round and reactive, no palptable cervical adenopathy,  CV: RRR, no m/r/g, no JVD, no carotid bruits Resp: Clear to auscultation bilaterally GI: abdomen is soft, non-tender, non-distended, normal bowel sounds, no hepatosplenomegaly MSK: extremities are warm, no edema.  Skin: warm, no rash Neuro:  no focal deficits Psych: appropriate affect   Diagnostic Studies 08/2013 Echo  LVEF 30-35%, + WMAs, grade I  diastolic dysfunction, mild MR,  07/2013 Cath  RESULTS: Left main coronary artery. The left main coronary artery is  free of significant disease.  Left anterior descending artery. The left anterior descending artery  gave rise to three septal perforators and two diagonal branches. These  and __________ were free of significant disease.  The circumflex artery: The circumflex artery gave rise to a ramus  Safa Derner, a marginal Yeraldine Forney, and a small posterolateral Valera Vallas. These  vessels were free of significant disease.  The right coronary artery: The right coronary artery was a moderate-  sized vessel that gave rise to posterior descending Chesky Heyer and three  posterolateral branches. The vessel also gave rise to a right  ventricular Nils Thor. There was an 80-90% stenosis at the ostium of the  right coronary artery. We could see some collateral filling from the  left coronary artery, which appeared to be right ventricular branches.  The left ventriculogram: The left ventriculogram performed in the RAO  projection showed good wall motion with no areas of hypokinesis. The  estimated ejection fraction was 60%.  The left ventricular pressure was 154/28 and __________ pressure was  154/86 with a mean of 116.  CONCLUSION: Coronary artery disease with 80-90% stenosis in the ostium  of the right coronary artery, no significant obstruction in the LAD and  circumflex arteries and normal LV function.  RECOMMENDATIONS: The patient has a tight what appears to be flow-  limiting lesion in the ostium of the right coronary artery. We will  plan to schedule the patient for to return for percutaneous coronary  intervention on Tuesday April 13. In the meantime, we will start Plavix  and continue aspirin and start the Toprol-XL 25 mg.  02/2014 Echo  Study Conclusions  - Procedure narrative: Transthoracic echocardiography. Image quality was suboptimal. The study was technically difficult, as a result of poor sound  wave transmission. - Left ventricle: Systolic function is severely  reduced, estimated EF 15-20%. Severe diffuse hypokinesis is noted. The cavity size was moderately dilated. Wall thickness was increased in a pattern of moderate LVH. Diastolic dysfunction is seen, indeterminate grade. The apex was poorly visualized. Doppler parameters are consistent with high ventricular filling pressure. - Ventricular septum: Septal motion showed abnormal function and dyssynergy. These changes are consistent with right ventricular pacing. - Aortic valve: Trileaflet; mildly thickened leaflets. There was no stenosis. - Mitral valve: Mildly thickened leaflets . Mild tethering of leaflet motion due to severe left ventricular dysfunction. Mild regurgitation. - Left atrium: The atrium was mildly to moderately dilated. - Right ventricle: The cavity size was normal. Wall thickness was normal. Pacer wire or catheter noted in right ventricle. Systolic function was mildly to moderately reduced. - Right atrium: Pacer wire or catheter noted in right atrium. - Atrial septum: The septum bowed from left to right, consistent with increased left atrial pressure. - Tricuspid valve: Mild regurgitation. - Pulmonary arteries: PA peak pressure: 72mm Hg (S). Mildly elevated pulmonary pressures.       Assessment and Plan  1.CAD/ICM/Chronic systolic HF - LVEF 16-57% by echo 02/2014, NYHA III, he has a BiV AICD with normal function last check 08/2014 - appears euvolemic today  - medical therapy had been limited due to low blood pressures, dizziness, and recent falls. These symptoms have since resolved and he is tolerating his current regimen. Will continue current meds   2. Hyperlipidemia  - continue crestor 40mg  daily  3. Afib /aflutter  - no current symptoms, continue ecurrent meds  - discussed possible ablation with EP last visit for flutter, currently patient is not interested.  - Labs on amio 05/2014 TSH 1.64,  overall normal LFTs though AST slightly elevated. Repeat surveillance labs next visit  4. GI procedures - patient is being considered for EGD and colonoscopy, overall low risk procedures. He is currently compensated from CHF standpoint with no active acute cardiac conditions. Recommend proceeding with studies as planned, if need can hold eliquis 2 days prior and resume one day after testing.   F/u 3 months   Arnoldo Lenis, M.D.

## 2014-09-10 NOTE — Telephone Encounter (Signed)
Pt [place] is set up for 09-16-14 @ 8:00. Pt's wife is aware

## 2014-09-10 NOTE — Patient Instructions (Signed)
There were no changes to your medications. Continue as directed. Your physician wants you to follow up in:  3 months.  You will receive a reminder letter in the mail one-two months in advance.  If you don't receive a letter, please call our office to schedule the follow up appointment.

## 2014-09-16 ENCOUNTER — Ambulatory Visit (HOSPITAL_COMMUNITY)
Admission: RE | Admit: 2014-09-16 | Discharge: 2014-09-16 | Disposition: A | Discharge status: Home / Self Care | Payer: Medicare Other | Source: Ambulatory Visit | Attending: Gastroenterology | Admitting: Gastroenterology

## 2014-09-16 DIAGNOSIS — K8020 Calculus of gallbladder without cholecystitis without obstruction: Secondary | ICD-10-CM | POA: Insufficient documentation

## 2014-09-16 DIAGNOSIS — R1011 Right upper quadrant pain: Secondary | ICD-10-CM | POA: Diagnosis not present

## 2014-09-16 DIAGNOSIS — K824 Cholesterolosis of gallbladder: Secondary | ICD-10-CM

## 2014-09-27 ENCOUNTER — Emergency Department (HOSPITAL_COMMUNITY): Payer: Medicare Other

## 2014-09-27 ENCOUNTER — Encounter (HOSPITAL_COMMUNITY): Payer: Self-pay | Admitting: Emergency Medicine

## 2014-09-27 ENCOUNTER — Inpatient Hospital Stay (HOSPITAL_COMMUNITY)
Admission: EM | Admit: 2014-09-27 | Discharge: 2014-09-29 | DRG: 291 | Disposition: A | Discharge status: Home / Self Care | Payer: Medicare Other | Attending: Internal Medicine | Admitting: Internal Medicine

## 2014-09-27 ENCOUNTER — Other Ambulatory Visit: Payer: Self-pay

## 2014-09-27 DIAGNOSIS — Z79899 Other long term (current) drug therapy: Secondary | ICD-10-CM

## 2014-09-27 DIAGNOSIS — Z800 Family history of malignant neoplasm of digestive organs: Secondary | ICD-10-CM

## 2014-09-27 DIAGNOSIS — I10 Essential (primary) hypertension: Secondary | ICD-10-CM | POA: Diagnosis not present

## 2014-09-27 DIAGNOSIS — I255 Ischemic cardiomyopathy: Secondary | ICD-10-CM | POA: Diagnosis present

## 2014-09-27 DIAGNOSIS — Z8546 Personal history of malignant neoplasm of prostate: Secondary | ICD-10-CM | POA: Diagnosis not present

## 2014-09-27 DIAGNOSIS — Z7982 Long term (current) use of aspirin: Secondary | ICD-10-CM | POA: Diagnosis not present

## 2014-09-27 DIAGNOSIS — N183 Chronic kidney disease, stage 3 (moderate): Secondary | ICD-10-CM | POA: Diagnosis present

## 2014-09-27 DIAGNOSIS — Z8249 Family history of ischemic heart disease and other diseases of the circulatory system: Secondary | ICD-10-CM | POA: Diagnosis not present

## 2014-09-27 DIAGNOSIS — Z8673 Personal history of transient ischemic attack (TIA), and cerebral infarction without residual deficits: Secondary | ICD-10-CM

## 2014-09-27 DIAGNOSIS — I509 Heart failure, unspecified: Secondary | ICD-10-CM

## 2014-09-27 DIAGNOSIS — N189 Chronic kidney disease, unspecified: Secondary | ICD-10-CM

## 2014-09-27 DIAGNOSIS — I5023 Acute on chronic systolic (congestive) heart failure: Principal | ICD-10-CM | POA: Diagnosis present

## 2014-09-27 DIAGNOSIS — N184 Chronic kidney disease, stage 4 (severe): Secondary | ICD-10-CM | POA: Diagnosis present

## 2014-09-27 DIAGNOSIS — Z933 Colostomy status: Secondary | ICD-10-CM

## 2014-09-27 DIAGNOSIS — I517 Cardiomegaly: Secondary | ICD-10-CM | POA: Diagnosis not present

## 2014-09-27 DIAGNOSIS — Z95810 Presence of automatic (implantable) cardiac defibrillator: Secondary | ICD-10-CM | POA: Diagnosis not present

## 2014-09-27 DIAGNOSIS — Z6831 Body mass index (BMI) 31.0-31.9, adult: Secondary | ICD-10-CM | POA: Diagnosis not present

## 2014-09-27 DIAGNOSIS — I129 Hypertensive chronic kidney disease with stage 1 through stage 4 chronic kidney disease, or unspecified chronic kidney disease: Secondary | ICD-10-CM | POA: Diagnosis present

## 2014-09-27 DIAGNOSIS — E785 Hyperlipidemia, unspecified: Secondary | ICD-10-CM | POA: Diagnosis present

## 2014-09-27 DIAGNOSIS — Z85048 Personal history of other malignant neoplasm of rectum, rectosigmoid junction, and anus: Secondary | ICD-10-CM

## 2014-09-27 DIAGNOSIS — J9601 Acute respiratory failure with hypoxia: Secondary | ICD-10-CM

## 2014-09-27 DIAGNOSIS — E669 Obesity, unspecified: Secondary | ICD-10-CM | POA: Diagnosis present

## 2014-09-27 DIAGNOSIS — G4733 Obstructive sleep apnea (adult) (pediatric): Secondary | ICD-10-CM | POA: Diagnosis present

## 2014-09-27 DIAGNOSIS — K219 Gastro-esophageal reflux disease without esophagitis: Secondary | ICD-10-CM | POA: Diagnosis present

## 2014-09-27 DIAGNOSIS — I369 Nonrheumatic tricuspid valve disorder, unspecified: Secondary | ICD-10-CM | POA: Diagnosis not present

## 2014-09-27 DIAGNOSIS — I480 Paroxysmal atrial fibrillation: Secondary | ICD-10-CM | POA: Diagnosis present

## 2014-09-27 DIAGNOSIS — E119 Type 2 diabetes mellitus without complications: Secondary | ICD-10-CM | POA: Diagnosis present

## 2014-09-27 DIAGNOSIS — I2510 Atherosclerotic heart disease of native coronary artery without angina pectoris: Secondary | ICD-10-CM | POA: Diagnosis present

## 2014-09-27 DIAGNOSIS — R918 Other nonspecific abnormal finding of lung field: Secondary | ICD-10-CM | POA: Diagnosis not present

## 2014-09-27 DIAGNOSIS — N179 Acute kidney failure, unspecified: Secondary | ICD-10-CM | POA: Diagnosis present

## 2014-09-27 DIAGNOSIS — R0602 Shortness of breath: Secondary | ICD-10-CM | POA: Diagnosis not present

## 2014-09-27 LAB — CBC
HCT: 37.4 % — ABNORMAL LOW (ref 39.0–52.0)
Hemoglobin: 12.7 g/dL — ABNORMAL LOW (ref 13.0–17.0)
MCH: 31.9 pg (ref 26.0–34.0)
MCHC: 34 g/dL (ref 30.0–36.0)
MCV: 94 fL (ref 78.0–100.0)
PLATELETS: 205 10*3/uL (ref 150–400)
RBC: 3.98 MIL/uL — AB (ref 4.22–5.81)
RDW: 13 % (ref 11.5–15.5)
WBC: 11.3 10*3/uL — ABNORMAL HIGH (ref 4.0–10.5)

## 2014-09-27 LAB — PRO B NATRIURETIC PEPTIDE: PRO B NATRI PEPTIDE: 9164 pg/mL — AB (ref 0–125)

## 2014-09-27 LAB — BASIC METABOLIC PANEL
ANION GAP: 18 — AB (ref 5–15)
BUN: 29 mg/dL — ABNORMAL HIGH (ref 6–23)
CALCIUM: 9.4 mg/dL (ref 8.4–10.5)
CO2: 19 mEq/L (ref 19–32)
CREATININE: 2.51 mg/dL — AB (ref 0.50–1.35)
Chloride: 100 mEq/L (ref 96–112)
GFR calc non Af Amer: 26 mL/min — ABNORMAL LOW (ref >90)
GFR, EST AFRICAN AMERICAN: 30 mL/min — AB (ref >90)
Glucose, Bld: 299 mg/dL — ABNORMAL HIGH (ref 70–99)
Potassium: 4 mEq/L (ref 3.7–5.3)
SODIUM: 137 meq/L (ref 137–147)

## 2014-09-27 LAB — HEMOGLOBIN A1C
HEMOGLOBIN A1C: 10.7 % — AB (ref <5.7)
MEAN PLASMA GLUCOSE: 260 mg/dL — AB (ref <117)

## 2014-09-27 LAB — TROPONIN I
Troponin I: <0.3 ng/mL (ref <0.30)
Troponin I: <0.3 ng/mL (ref <0.30)

## 2014-09-27 LAB — GLUCOSE, CAPILLARY
Glucose-Capillary: 247 mg/dL — ABNORMAL HIGH (ref 70–99)
Glucose-Capillary: 251 mg/dL — ABNORMAL HIGH (ref 70–99)

## 2014-09-27 LAB — PROTIME-INR
INR: 1.25 (ref 0.00–1.49)
Prothrombin Time: 15.8 seconds — ABNORMAL HIGH (ref 11.6–15.2)

## 2014-09-27 LAB — DIGOXIN LEVEL: Digoxin Level: <0.3 ng/mL — ABNORMAL LOW (ref 0.8–2.0)

## 2014-09-27 MED ORDER — ROSUVASTATIN CALCIUM 40 MG PO TABS: 40.0000 mg | ORAL_TABLET | Freq: Every day | ORAL | Status: DC

## 2014-09-27 MED ORDER — ROSUVASTATIN CALCIUM 20 MG PO TABS
40.0000 mg | ORAL_TABLET | Freq: Every day | ORAL | Status: DC
  Administered 2014-09-27 – 2014-09-28 (×2): 40 mg via ORAL
  Filled 2014-09-27 (×2): qty 2

## 2014-09-27 MED ORDER — FUROSEMIDE 10 MG/ML IJ SOLN: 40.0000 mg | Freq: Four times a day (QID) | INTRAMUSCULAR | Status: DC

## 2014-09-27 MED ORDER — HEPARIN SODIUM (PORCINE) 5000 UNIT/ML IJ SOLN: 5000.0000 [IU] | Freq: Three times a day (TID) | INTRAMUSCULAR | Status: DC

## 2014-09-27 MED ORDER — FLUTICASONE PROPIONATE 50 MCG/ACT NA SUSP: 1.0000 | Freq: Every day | NASAL | Status: DC | PRN

## 2014-09-27 MED ORDER — ASPIRIN EC 81 MG PO TBEC
81.0000 mg | DELAYED_RELEASE_TABLET | Freq: Every day | ORAL | Status: DC
  Administered 2014-09-27 – 2014-09-28 (×2): 81 mg via ORAL
  Filled 2014-09-27 (×2): qty 1

## 2014-09-27 MED ORDER — ACETAMINOPHEN 325 MG PO TABS
650.0000 mg | ORAL_TABLET | Freq: Four times a day (QID) | ORAL | Status: DC | PRN
Start: 2014-09-27 — End: 2014-09-29
  Administered 2014-09-28: 650 mg via ORAL
  Filled 2014-09-27: qty 2

## 2014-09-27 MED ORDER — APIXABAN 5 MG PO TABS
5.0000 mg | ORAL_TABLET | Freq: Two times a day (BID) | ORAL | Status: DC
  Administered 2014-09-27 – 2014-09-29 (×5): 5 mg via ORAL
  Filled 2014-09-27 (×5): qty 1

## 2014-09-27 MED ORDER — AMIODARONE HCL 200 MG PO TABS
200.0000 mg | ORAL_TABLET | Freq: Every day | ORAL | Status: DC
  Administered 2014-09-27 – 2014-09-29 (×3): 200 mg via ORAL
  Filled 2014-09-27 (×5): qty 1

## 2014-09-27 MED ORDER — ALUM & MAG HYDROXIDE-SIMETH 200-200-20 MG/5ML PO SUSP: 30.0000 mL | Freq: Four times a day (QID) | ORAL | Status: DC | PRN

## 2014-09-27 MED ORDER — ISOSORBIDE DINITRATE 10 MG PO TABS
15.0000 mg | ORAL_TABLET | Freq: Two times a day (BID) | ORAL | Status: DC
  Administered 2014-09-27 – 2014-09-29 (×3): 15 mg via ORAL
  Filled 2014-09-27 (×7): qty 1.5

## 2014-09-27 MED ORDER — METOPROLOL SUCCINATE ER 25 MG PO TB24
25.0000 mg | ORAL_TABLET | Freq: Two times a day (BID) | ORAL | Status: DC
  Administered 2014-09-27 – 2014-09-29 (×3): 25 mg via ORAL
  Filled 2014-09-27 (×4): qty 1

## 2014-09-27 MED ORDER — METOPROLOL SUCCINATE ER 50 MG PO TB24: 100.0000 mg | ORAL_TABLET | Freq: Every day | ORAL | Status: DC

## 2014-09-27 MED ORDER — ONDANSETRON HCL 4 MG/2ML IJ SOLN: 4.0000 mg | Freq: Four times a day (QID) | INTRAMUSCULAR | Status: DC | PRN

## 2014-09-27 MED ORDER — TRAZODONE HCL 50 MG PO TABS: 25.0000 mg | ORAL_TABLET | Freq: Every evening | ORAL | Status: DC | PRN

## 2014-09-27 MED ORDER — INSULIN ASPART 100 UNIT/ML ~~LOC~~ SOLN
0.0000 [IU] | Freq: Every day | SUBCUTANEOUS | Status: DC
  Administered 2014-09-27: 3 [IU] via SUBCUTANEOUS
  Administered 2014-09-28: 2 [IU] via SUBCUTANEOUS

## 2014-09-27 MED ORDER — PNEUMOCOCCAL VAC POLYVALENT 25 MCG/0.5ML IJ INJ
0.5000 mL | INJECTION | INTRAMUSCULAR | Status: AC
  Administered 2014-09-28: 0.5 mL via INTRAMUSCULAR
  Filled 2014-09-27: qty 0.5

## 2014-09-27 MED ORDER — FUROSEMIDE 10 MG/ML IJ SOLN
40.0000 mg | Freq: Two times a day (BID) | INTRAMUSCULAR | Status: DC
  Administered 2014-09-27 – 2014-09-28 (×2): 40 mg via INTRAVENOUS
  Filled 2014-09-27 (×2): qty 4

## 2014-09-27 MED ORDER — PANTOPRAZOLE SODIUM 40 MG PO TBEC
40.0000 mg | DELAYED_RELEASE_TABLET | Freq: Every day | ORAL | Status: DC
  Administered 2014-09-27 – 2014-09-29 (×3): 40 mg via ORAL
  Filled 2014-09-27 (×3): qty 1

## 2014-09-27 MED ORDER — FUROSEMIDE 10 MG/ML IJ SOLN: 40.0000 mg | Freq: Two times a day (BID) | INTRAMUSCULAR | Status: DC

## 2014-09-27 MED ORDER — ACETAMINOPHEN 650 MG RE SUPP: 650.0000 mg | Freq: Four times a day (QID) | RECTAL | Status: DC | PRN

## 2014-09-27 MED ORDER — HYDROCODONE-ACETAMINOPHEN 5-325 MG PO TABS: 1.0000 | ORAL_TABLET | ORAL | Status: DC | PRN

## 2014-09-27 MED ORDER — FUROSEMIDE 10 MG/ML IJ SOLN
80.0000 mg | Freq: Once | INTRAMUSCULAR | Status: AC
  Administered 2014-09-27: 80 mg via INTRAVENOUS
  Filled 2014-09-27: qty 8

## 2014-09-27 MED ORDER — SODIUM CHLORIDE 0.9 % IV SOLN: 250.0000 mL | INTRAVENOUS | Status: DC | PRN

## 2014-09-27 MED ORDER — MECLIZINE HCL 12.5 MG PO TABS: 25.0000 mg | ORAL_TABLET | ORAL | Status: DC | PRN

## 2014-09-27 MED ORDER — SODIUM CHLORIDE 0.9 % IJ SOLN
3.0000 mL | Freq: Two times a day (BID) | INTRAMUSCULAR | Status: DC
  Administered 2014-09-27 – 2014-09-28 (×4): 3 mL via INTRAVENOUS

## 2014-09-27 MED ORDER — CITALOPRAM HYDROBROMIDE 20 MG PO TABS
20.0000 mg | ORAL_TABLET | Freq: Every day | ORAL | Status: DC
  Administered 2014-09-27 – 2014-09-29 (×3): 20 mg via ORAL
  Filled 2014-09-27 (×3): qty 1

## 2014-09-27 MED ORDER — DIGOXIN 125 MCG PO TABS
0.1250 mg | ORAL_TABLET | ORAL | Status: DC
  Administered 2014-09-27 – 2014-09-29 (×2): 0.125 mg via ORAL
  Filled 2014-09-27 (×2): qty 1

## 2014-09-27 MED ORDER — SODIUM CHLORIDE 0.9 % IJ SOLN: 3.0000 mL | INTRAMUSCULAR | Status: DC | PRN

## 2014-09-27 MED ORDER — ONDANSETRON HCL 4 MG PO TABS: 4.0000 mg | ORAL_TABLET | Freq: Four times a day (QID) | ORAL | Status: DC | PRN

## 2014-09-27 MED ORDER — METFORMIN HCL 500 MG PO TABS: 500.0000 mg | ORAL_TABLET | Freq: Two times a day (BID) | ORAL | Status: DC

## 2014-09-27 MED ORDER — INSULIN ASPART 100 UNIT/ML ~~LOC~~ SOLN
0.0000 [IU] | Freq: Three times a day (TID) | SUBCUTANEOUS | Status: DC
  Administered 2014-09-27: 5 [IU] via SUBCUTANEOUS
  Administered 2014-09-27: 8 [IU] via SUBCUTANEOUS
  Administered 2014-09-28: 3 [IU] via SUBCUTANEOUS
  Administered 2014-09-28 (×2): 5 [IU] via SUBCUTANEOUS
  Administered 2014-09-29: 8 [IU] via SUBCUTANEOUS
  Administered 2014-09-29: 5 [IU] via SUBCUTANEOUS

## 2014-09-27 MED ORDER — RANOLAZINE ER 500 MG PO TB12
1000.0000 mg | ORAL_TABLET | Freq: Two times a day (BID) | ORAL | Status: DC
  Administered 2014-09-27 – 2014-09-29 (×5): 1000 mg via ORAL
  Filled 2014-09-27 (×6): qty 2

## 2014-09-27 NOTE — ED Notes (Signed)
Onset of sob and cough 24 hours ago, no relief with lasix. Wheezing now and feels sob with 6/10 cp

## 2014-09-27 NOTE — Plan of Care (Signed)
Problem: Phase I Progression Outcomes Goal: EF % per last Echo/documented,Core Reminder form on chart Outcome: Progressing 2D echo done today.

## 2014-09-27 NOTE — H&P (Signed)
[facility] History and Physical  KOHEN REITHER TJQ:[id number] DOB: 1956/11/16 DOA: 09/27/2014  Referring physician:  PCP: Glo Herring., MD   Chief Complaint: sob  HPI: [name] is a 58 y.o. male with a past medical history of CAD status post stent to LAD in 2001, ischemic cardiomyopathy with ICD, A. fib on amiodarone, hypertension, hyperlipidemia, diabetes, chronic systolic heart failure with an EF of 10-20% with severe diffuse hypokinesis presents to the emergency department with dyspnea and edema. Initial evaluation revealed acute on chronic systolic heart failure, acute on chronic renal failure.  Patient indicates that yesterday he developed sudden orthopnea with coughing thin brownish mucus. He states that for the last 3 months his Lasix has been changed to when necessary based on weight and lower extremity edema 2 to severe hypotension/dehydration/falls in the recent past. He reports he has been compliant with diet and medications and his weight has not fluctuated. He states he took Lasix yesterday and felt a little better but last night worsening dyspnea with cough the he took another Lasix with no relief was unable to sleep all night. By 5 AM he decided to to come to the emergency department. He denies chest pain except with cough. Associated symptoms do include abdominal distention and lower extremity edema. He does not use oxygen at home. He denies fever chills nausea vomiting diaphoresis. He reports eating and drinking his normal amount. Denies dysuria hematuria frequency or urgency.  Work up in ED incluces CMET significant for creatinine 2.51, serum glucose 299, proBNP 9164, CBC significant for WBC 11.3, dig level <0.3 troponin negative. Chest xray Cardiac enlargement with bilateral perihilar infiltrates.    In the emergency department he is afebrile, hemodynamically stable but hypoxic with an oxygen saturation level of 89% on room air. He is given 80 mg of Lasix  intravenously.   Review of Systems:  . Review of systems completed all systems are negative except as indicated in the history of present illness   Past Medical History  Diagnosis Date  . Essential hypertension, benign   . Type 2 diabetes mellitus   . CAD (coronary artery disease)     a. BMS to LAD 2001 at [facility] b. PTCA/atherectomy ramus and BMS to LAD 2009  . Chronic systolic heart failure     a. 2D ECHO: 03/01/2014 EF 15-20%, severe, diffuse hypsokinesis. Mod LV dilation. Mod LVH, indeterminate diastolic dysfxn (poor study), high vent filling pressures, abnormal septal wall motion and dyssynergy c/w RV pacing, mild MR, mod LA dilation, RV systolic function mod reduced, mild TR, PA pressure 42.   Marland Kitchen GERD (gastroesophageal reflux disease)   . Cardiomyopathy, ischemic     a. BIV ICD St. Jude, LVEF 23%  . Paroxysmal atrial fibrillation     a. on amiodarone, digoxin and Eliquis  . Adenocarcinoma of rectum     a. 2008  . Prostate cancer     a. s/p seed implants with chemo and radiation  . 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 St Jude  . TIA (transient ischemic attack)   . OSA on CPAP   . HLD (hyperlipidemia)   . Orthostatic hypotension   . Dizziness     a. chronic. Admission for this 07/18/2014  . History of falling July 2015    due to dizziness from medications   Past Surgical History  Procedure Laterality Date  . Internal defibrillator and pacemaker  2010    x2 St. Jude device  . Abdominal  and perineal resection of rectum with total mesorectal excision  10/04/2007  . Colonoscopy      05/17/2007. IMPRESSION: Semilunar, apple-core neoplasm low in the rectum (palpable on digital rectal exam) beginning at 5 cm and corkscrewing up 5 cm in length. This was a low rectal lesion consistent with colorectal carcinoma. It was biosied multiple times. The upstream colon all the way to the cecum appeared normal. Recommendations: Followup on path. Surgical Consultation    . Colonoscopy  09/14/2011    Dr. Gala Romney: via colostomy, Single pedunculated benign inflammatory polyp. Due for surveillance Oct 2015  . Colostomy    . Colonoscopy N/A 07/02/2014    Procedure: COLONOSCOPY;  Surgeon: Daneil Dolin, MD;  [facility];  Service: Endoscopy;  Laterality: N/A;  7:30 / COLONOSCOPY THRU COLOSTOMY  . Esophagogastroduodenoscopy N/A 07/02/2014    Procedure: ESOPHAGOGASTRODUODENOSCOPY (EGD);  Surgeon: Daneil Dolin, MD;  [facility]: [facility];  Service: Endoscopy;  Laterality: N/A;  7:30  . Savory dilation N/A 07/02/2014    Procedure: SAVORY DILATION;  Surgeon: Daneil Dolin, MD;  [facility];  Service: Endoscopy;  Laterality: N/A;  7:30  . Maloney dilation N/A 07/02/2014    Procedure: Venia Minks DILATION;  Surgeon: Daneil Dolin, MD;  [facility]: [facility];  Service: Endoscopy;  Laterality: N/A;  7:30  . Portacath placement  06/2007    "removed ~ 1 yr later"  . Cardiac catheterization  08/2001  . Coronary angioplasty with stent placement  2001; ~ 2006    "1 + 1"    Social History:  reports that he has never smoked. He has never used smokeless tobacco. He reports that he does not drink alcohol or use illicit drugs.  No Known Allergies  Family History  Problem Relation Age of Onset  . Colon cancer Mother 4  . Colon cancer Sister 30  . Coronary artery disease Father   . Colon cancer Other     2 cousins, succumbed to illness     Prior to Admission medications   Medication Sig Start Date End Date Taking? Authorizing Provider  amiodarone (PACERONE) 200 MG tablet Take 1 tablet (200 mg total) by mouth daily. 05/23/14  Yes Maryann Mikhail, DO  apixaban (ELIQUIS) 5 MG TABS tablet Take 5 mg by mouth 2 (two) times daily.   Yes Historical Provider, MD  aspirin 81 MG EC tablet Take 81 mg by mouth at bedtime.    Yes Historical Provider, MD  co-enzyme Q-10 50 MG capsule Take 50 mg by mouth every morning.    Yes Historical Provider, MD  niacin (NIASPAN)  1000 MG CR tablet Take 1,000 mg by mouth at bedtime.     Yes Historical Provider, MD  Omega-3 Fatty Acids (FISH OIL) 1200 MG CAPS Take 1,200 mg by mouth 2 (two) times daily.     Yes Historical Provider, MD  spironolactone (ALDACTONE) 12.5 mg TABS tablet Take 12.5 mg by mouth daily.   Yes Historical Provider, MD  citalopram (CELEXA) 20 MG tablet Take 1 tablet (20 mg total) by mouth daily. 07/19/14   Arnoldo Lenis, MD  digoxin (LANOXIN) 0.125 MG tablet Take 1 tablet (0.125 mg total) by mouth every other day. 05/17/14   Lorretta Harp, MD  enalapril (VASOTEC) 2.5 MG tablet Take 1 tablet (2.5 mg total) by mouth daily. 07/19/14   Eileen Stanford, PA-C  fluticasone (FLONASE) 50 MCG/ACT nasal spray Place 1 spray into both nostrils daily as needed for allergies.  11/21/13   Historical Provider, MD  isosorbide dinitrate (ISORDIL) 30 MG tablet Take 15 mg by mouth 2 (two) times daily.  07/31/14   Historical Provider, MD  meclizine (ANTIVERT) 25 MG tablet Take 25 mg by mouth as needed for dizziness.    Historical Provider, MD  metFORMIN (GLUCOPHAGE) 500 MG tablet Take 500 mg by mouth 2 (two) times daily with a meal.    Historical Provider, MD  metoprolol succinate (TOPROL-XL) 100 MG 24 hr tablet Take 100 mg by mouth daily.    Historical Provider, MD  nitroGLYCERIN (NITROLINGUAL) 0.4 MG/SPRAY spray Place 1 spray under the tongue every 5 (five) minutes as needed. angina 07/19/14   Arnoldo Lenis, MD  pantoprazole (PROTONIX) 40 MG tablet Take 1 tablet (40 mg total) by mouth daily. 07/19/14   Arnoldo Lenis, MD  ranolazine (RANEXA) 1000 MG SR tablet Take 1 tablet (1,000 mg total) by mouth 2 (two) times daily. 05/13/14   Arnoldo Lenis, MD  rosuvastatin (CRESTOR) 40 MG tablet Take 40 mg by mouth at bedtime. 01/18/14   Arnoldo Lenis, MD   Physical Exam: Filed Vitals:   09/27/14 0630 09/27/14 0700 09/27/14 0705 09/27/14 0829  BP: 124/68 110/66  117/67  Pulse: 70 70  70  Temp:   98.5 F (36.9 C) 97.7  F (36.5 C)  TempSrc:   Oral Oral  Resp:    16  Height:      Weight:      SpO2: 89% 94%  99%    Wt Readings from Last 3 Encounters:  09/27/14 99.338 kg (219 lb)  09/10/14 98.939 kg (218 lb 1.9 oz)  08/30/14 99.746 kg (219 lb 14.4 oz)    General:  Appears calm and comfortable  Eyes: PERRL, normal lids, irises & conjunctiva ENT: grossly normal hearing, because membranes of his mouth are pink slightly dry Neck: no LAD, masses or thyromegaly Cardiovascular: RRR, no m/r/g. Trace lower extremity edema pedal pulses present and palpable Respiratory: No increased work of breathing with conversation. Breath sounds with fair air flow fine crackles bilateral bases. Abdomen: Obese soft positive bowel sounds. Nontender to palpation ostomy bag intact Skin: no rash or induration seen on limited exam Musculoskeletal: grossly normal tone BUE/BLE Psychiatric: grossly normal mood and affect, speech fluent and appropriate Neurologic: grossly non-focal. Speech clear facial symmetry           Labs on Admission:  Basic Metabolic Panel:  Recent Labs Lab 09/27/14 0523  NA 137  K 4.0  CL 100  CO2 19  GLUCOSE 299*  BUN 29*  CREATININE 2.51*  CALCIUM 9.4   Liver Function Tests: No results found for this basename: AST, ALT, ALKPHOS, BILITOT, PROT, ALBUMIN,  in the last 168 hours No results found for this basename: LIPASE, AMYLASE,  in the last 168 hours No results found for this basename: AMMONIA,  in the last 168 hours CBC:  Recent Labs Lab 09/27/14 0523  WBC 11.3*  HGB 12.7*  HCT 37.4*  MCV 94.0  PLT 205   Cardiac Enzymes:  Recent Labs Lab 09/27/14 0523  TROPONINI <0.30    BNP (last 3 results)  Recent Labs  05/19/14 0203 07/18/14 0555 09/27/14 0523  PROBNP 2631.0* 1116.0* 9164.0*   CBG: No results found for this basename: GLUCAP,  in the last 168 hours  Radiological Exams on Admission: Dg Chest Port 1 View  09/27/2014   CLINICAL DATA:  Shortness of breath.  History of rectal cancer and prostate cancer.  EXAM: PORTABLE CHEST - 1 VIEW  COMPARISON:  07/17/2014  FINDINGS: Cardiac pacemaker. Cardiac enlargement. Bilateral perihilar infiltrates, greater on the right, suggesting pneumonia or edema. This is new since previous study. No blunting of costophrenic angles. No pneumothorax.  IMPRESSION: Cardiac enlargement with bilateral perihilar infiltrates.   Electronically Signed   By: Lucienne Capers M.D.   On: 09/27/2014 06:01    EK  Assessment/Plan Principal Problem:   Acute respiratory failure with hypoxia: Likely related to acute on chronic systolic heart failure. Good diuresis with Lasix he received in the emergency department. At the time of my exam oxygen saturation level greater than 90% on 2 L nasal cannula. Will continue Lasix IV every 12 hours. Monitor intake and output. Weights. Active Problems:   Acute on chronic clinical systolic heart failure: Recent EF 15-20% NYHA 3, AICD. Lasix made prn due to hypotension. Continue Lasix and IV every 12. Daily weights strict intake and output. Reality note indicates medication change Toprol-XL 25 twice a day we'll continue this. Also on enalapril 2.5 daily hold this for now do to worsening creatinine.   Acute-on-chronic renal failure: related to #2. Baseline appears to be 1.8. Will continue lasix and monitor closely . Hold ACE inhibitor. Monitor output   Diabetes: home regimen metformin and diet. Obtain A1c. Use SSI for optimal control. Hold metformin.     Paroxysmal atrial fibrillation: continue home meds. On eliquis.        Obesity: BMI 31.5. Nutritional consult     GERD (gastroesophageal reflux disease): stable at baseline    CAD (coronary artery disease): last cath 2014 with non-dominanat RCA lesion per cards note. Medical managment         Code Status: full DVT Prophylaxis: Family Communication: son at bedside Disposition Plan: home hopefully 24-48 hours  Time spent: 33  minutes  [facility] Pager [phone]

## 2014-09-27 NOTE — ED Provider Notes (Addendum)
CSN: [id number]     Arrival date & time 09/27/14  0506 History   First MD Initiated Contact with Patient 09/27/14 [phone]     Chief Complaint  Patient presents with  . Shortness of Breath      HPI  Patient presents for evaluation of shortness of breath.   Has a history of significant disease and ischemic myopathy. EF 15-20 on echo 3/27. Has AICD, pacemaker. He is only taking Lasix when necessary now because of some recent difficulties with orthostasis. He was admitted in August with dizziness and near syncope. Was thought to be somewhat prerenal and oriented needed and thus his diuretic medication regimen was altered.. He has done well since discharge on that hospitalization. Follows his weight, and uses his feet as a guide to his amount of swelling or edema.  Describe shortness of breath starting 24 hours ago. He sat in his chair upward all night because he was unable a flat due to dyspnea. Began to hear himself "wheezing" with breathing and developed a productive cough of some clear sputum. Denies any hemoptysis. No chest pain with the exception of some bilateral anterior chest pain with cough. He has a trace to 1+ lower extremity edema that is unusual for him. He is not on home O2, and is hypoxemic here 88% on arrival.  Past Medical History  Diagnosis Date  . Essential hypertension, benign   . Type 2 diabetes mellitus   . CAD (coronary artery disease)     a. BMS to LAD 2001 at [facility] b. PTCA/atherectomy ramus and BMS to LAD 2009  . Chronic systolic heart failure     a. 2D ECHO: 03/01/2014 EF 15-20%, severe, diffuse hypsokinesis. Mod LV dilation. Mod LVH, indeterminate diastolic dysfxn (poor study), high vent filling pressures, abnormal septal wall motion and dyssynergy c/w RV pacing, mild MR, mod LA dilation, RV systolic function mod reduced, mild TR, PA pressure 42.   Marland Kitchen GERD (gastroesophageal reflux disease)   . Cardiomyopathy, ischemic     a. BIV ICD St. Jude, LVEF 23%  . Paroxysmal  atrial fibrillation     a. on amiodarone, digoxin and Eliquis  . Adenocarcinoma of rectum     a. 2008  . Prostate cancer     a. s/p seed implants with chemo and radiation  . 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 St Jude  . TIA (transient ischemic attack)   . OSA on CPAP   . HLD (hyperlipidemia)   . Orthostatic hypotension   . Dizziness     a. chronic. Admission for this 07/18/2014  . History of falling July 2015    due to dizziness from medications   Past Surgical History  Procedure Laterality Date  . Internal defibrillator and pacemaker  2010    x2 St. Jude device  . Abdominal and perineal resection of rectum with total mesorectal excision  10/04/2007  . Colonoscopy      05/17/2007. IMPRESSION: Semilunar, apple-core neoplasm low in the rectum (palpable on digital rectal exam) beginning at 5 cm and corkscrewing up 5 cm in length. This was a low rectal lesion consistent with colorectal carcinoma. It was biosied multiple times. The upstream colon all the way to the cecum appeared normal. Recommendations: Followup on path. Surgical Consultation   . Colonoscopy  09/14/2011    Dr. Gala Romney: via colostomy, Single pedunculated benign inflammatory polyp. Due for surveillance Oct 2015  . Colostomy    . Colonoscopy N/A 07/02/2014  Procedure: COLONOSCOPY;  Surgeon: Daneil Dolin, MD;  [facility];  Service: Endoscopy;  Laterality: N/A;  7:30 / COLONOSCOPY THRU COLOSTOMY  . Esophagogastroduodenoscopy N/A 07/02/2014    Procedure: ESOPHAGOGASTRODUODENOSCOPY (EGD);  Surgeon: Daneil Dolin, MD;  [facility];  Service: Endoscopy;  Laterality: N/A;  7:30  . Savory dilation N/A 07/02/2014    Procedure: SAVORY DILATION;  Surgeon: Daneil Dolin, MD;  [facility];  Service: Endoscopy;  Laterality: N/A;  7:30  . Maloney dilation N/A 07/02/2014    Procedure: Venia Minks DILATION;  Surgeon: Daneil Dolin, MD;  [facility];  Service:  Endoscopy;  Laterality: N/A;  7:30  . Portacath placement  06/2007    "removed ~ 1 yr later"  . Cardiac catheterization  08/2001  . Coronary angioplasty with stent placement  2001; ~ 2006    "1 + 1"    Family History  Problem Relation Age of Onset  . Colon cancer Mother 91  . Colon cancer Sister 32  . Coronary artery disease Father   . Colon cancer Other     2 cousins, succumbed to illness   History  Substance Use Topics  . Smoking status: Never Smoker   . Smokeless tobacco: Never Used  . Alcohol Use: No     Comment: Former user 45 years ago    Review of Systems  Constitutional: Negative for fever, chills, diaphoresis, appetite change, fatigue and unexpected weight change.  HENT: Negative for mouth sores, sore throat and trouble swallowing.   Eyes: Negative for visual disturbance.  Respiratory: Positive for cough and shortness of breath. Negative for chest tightness and wheezing.   Cardiovascular: Positive for leg swelling. Negative for chest pain.  Gastrointestinal: Negative for nausea, vomiting, abdominal pain, diarrhea and abdominal distention.  Endocrine: Negative for polydipsia, polyphagia and polyuria.  Genitourinary: Negative for dysuria, frequency and hematuria.  Musculoskeletal: Negative for gait problem.  Skin: Negative for color change, pallor and rash.  Neurological: Negative for dizziness, syncope, light-headedness and headaches.  Hematological: Does not bruise/bleed easily.  Psychiatric/Behavioral: Negative for behavioral problems and confusion.      Allergies  Review of patient's allergies indicates no known allergies.  Home Medications   Prior to Admission medications   Medication Sig Start Date End Date Taking? Authorizing Provider  amiodarone (PACERONE) 200 MG tablet Take 1 tablet (200 mg total) by mouth daily. 05/23/14   Maryann Mikhail, DO  apixaban (ELIQUIS) 5 MG TABS tablet Take 5 mg by mouth 2 (two) times daily.    Historical Provider, MD  aspirin  81 MG EC tablet Take 81 mg by mouth at bedtime.     Historical Provider, MD  citalopram (CELEXA) 20 MG tablet Take 1 tablet (20 mg total) by mouth daily. 07/19/14   Arnoldo Lenis, MD  co-enzyme Q-10 50 MG capsule Take 50 mg by mouth every morning.     Historical Provider, MD  digoxin (LANOXIN) 0.125 MG tablet Take 1 tablet (0.125 mg total) by mouth every other day. 05/17/14   Lorretta Harp, MD  enalapril (VASOTEC) 2.5 MG tablet Take 1 tablet (2.5 mg total) by mouth daily. 07/19/14   Eileen Stanford, PA-C  fluticasone (FLONASE) 50 MCG/ACT nasal spray Place 1 spray into both nostrils daily as needed for allergies.  11/21/13   Historical Provider, MD  isosorbide dinitrate (ISORDIL) 30 MG tablet Take 15 mg by mouth 2 (two) times daily.  07/31/14   Historical Provider, MD  meclizine (ANTIVERT) 25 MG tablet Take 25 mg by mouth as needed for dizziness.    Historical Provider, MD  metFORMIN (GLUCOPHAGE) 500 MG tablet Take 500 mg by mouth 2 (two) times daily with a meal.    Historical Provider, MD  metoprolol succinate (TOPROL-XL) 100 MG 24 hr tablet Take 100 mg by mouth daily.    Historical Provider, MD  niacin (NIASPAN) 1000 MG CR tablet Take 1,000 mg by mouth at bedtime.      Historical Provider, MD  nitroGLYCERIN (NITROLINGUAL) 0.4 MG/SPRAY spray Place 1 spray under the tongue every 5 (five) minutes as needed. angina 07/19/14   Arnoldo Lenis, MD  Omega-3 Fatty Acids (FISH OIL) 1200 MG CAPS Take 1,200 mg by mouth 2 (two) times daily.      Historical Provider, MD  pantoprazole (PROTONIX) 40 MG tablet Take 1 tablet (40 mg total) by mouth daily. 07/19/14   Arnoldo Lenis, MD  ranolazine (RANEXA) 1000 MG SR tablet Take 1 tablet (1,000 mg total) by mouth 2 (two) times daily. 05/13/14   Arnoldo Lenis, MD  rosuvastatin (CRESTOR) 40 MG tablet Take 40 mg by mouth at bedtime. 01/18/14   Arnoldo Lenis, MD  spironolactone (ALDACTONE) 12.5 mg TABS tablet Take 12.5 mg by mouth daily.    Historical  Provider, MD   BP 123/76  Pulse 76  Temp(Src) 98.8 F (37.1 C) (Oral)  Resp 28  Ht 5\' 10"  (1.778 m)  Wt 219 lb (99.338 kg)  BMI 31.42 kg/m2  SpO2 90% Physical Exam  Constitutional: He is oriented to person, place, and time. He appears well-developed and well-nourished. No distress.  58 year old male. Awake alert. Pleasant. Dyspneic with conversation.  HENT:  Head: Normocephalic.  Eyes: Conjunctivae are normal. Pupils are equal, round, and reactive to light. No scleral icterus.  Neck: Normal range of motion. Neck supple. No thyromegaly present.  Large neck. Unable to appreciate JVD.  Cardiovascular: Normal rate and regular rhythm.  Exam reveals no gallop and no friction rub.   No murmur heard. Regular. Sinus rhythm with interventricular conduction delay on the monitor.  Pulmonary/Chest: Effort normal. No respiratory distress. He has no wheezes. He has rales in the right middle field, the right lower field, the left middle field and the left lower field.  Diffuse crackles and rales. No prolongation.  Abdominal: Soft. Bowel sounds are normal. He exhibits no distension. There is no tenderness. There is no rebound.  Musculoskeletal: Normal range of motion.  Neurological: He is alert and oriented to person, place, and time.  Skin: Skin is warm and dry. No rash noted.  1+ symmetric lower extremity edema  Psychiatric: He has a normal mood and affect. His behavior is normal.    ED Course  Procedures (including critical care time) Labs Review Labs Reviewed  CBC - Abnormal; Notable for the following:    WBC 11.3 (*)    RBC 3.98 (*)    Hemoglobin 12.7 (*)    HCT 37.4 (*)    All other components within normal limits  BASIC METABOLIC PANEL - Abnormal; Notable for the following:    Glucose, Bld 299 (*)    BUN 29 (*)    Creatinine, Ser 2.51 (*)    GFR calc non Af Amer 26 (*)    GFR calc Af Amer 30 (*)    Anion gap 18 (*)    All other components within normal limits  PRO B  NATRIURETIC PEPTIDE - Abnormal; Notable for the following:  Pro B Natriuretic peptide (BNP) 9164.0 (*)    All other components within normal limits  PROTIME-INR - Abnormal; Notable for the following:    Prothrombin Time 15.8 (*)    All other components within normal limits  DIGOXIN LEVEL - Abnormal; Notable for the following:    Digoxin Level <0.3 (*)    All other components within normal limits  TROPONIN I    Imaging Review Dg Chest Port 1 View  09/27/2014   CLINICAL DATA:  Shortness of breath. History of rectal cancer and prostate cancer.  EXAM: PORTABLE CHEST - 1 VIEW  COMPARISON:  07/17/2014  FINDINGS: Cardiac pacemaker. Cardiac enlargement. Bilateral perihilar infiltrates, greater on the right, suggesting pneumonia or edema. This is new since previous study. No blunting of costophrenic angles. No pneumothorax.  IMPRESSION: Cardiac enlargement with bilateral perihilar infiltrates.   Electronically Signed   By: Lucienne Capers M.D.   On: 09/27/2014 06:01     EKG Interpretation None     Sinus rhythm. Right bundle branch block. Unchanged versus comparison. MDM   Final diagnoses:  Congestive heart failure, unspecified congestive heart failure chronicity, unspecified congestive heart failure type    No change in rhythm. Not having pain. Not febrile. He is hypoxemic. Clinically and radiographically appears to be in congestive heart failure. BNP elevated over 9,000. Given IV Lasix 80 mg. BP 962 systolic. Thus no nitroglycerin given. Labs pending. Patient has tenuous volume status at baseline. Taking his Lasix PRN, he has gotten somewhat fluid overloaded.   06:26:  Hemoglobin 12.7. WBC 11.3. Creatinine at 2.5, slightly up from his baseline. BNP 9, 164. Digoxin less than 0.3.  Patient comfortable at 2 L nasal cannula. Plan is admission for diuresis.   Care discussed with Dr. Darrick Meigs. Patient admitted to a telemetry bed. Stable.    Tanna Furry, MD 09/27/14 [id number]  Tanna Furry,  MD 09/27/14 [place], MD 09/27/14 [phone]

## 2014-09-27 NOTE — Progress Notes (Signed)
Nutrition Brief Note  Patient identified on the Malnutrition Screening Tool (MST) Report  Pt presents with acute on chronic heart failure EF of 10-20% and acute on chronic renal failure 9BUN 29, Creat. 2.51. Glucose 299 mg/dl.  Sodium  Date/Time Value Ref Range Status  09/27/2014  5:23 AM 137  137 - 147 mEq/L Final  07/18/2014  5:55 AM 135* 137 - 147 mEq/L Final  07/17/2014 11:01 AM 135* 137 - 147 mEq/L Final    Potassium  Date/Time Value Ref Range Status  09/27/2014  5:23 AM 4.0  3.7 - 5.3 mEq/L Final  07/18/2014  5:55 AM 5.2  3.7 - 5.3 mEq/L Final     HEMOLYSIS AT THIS LEVEL MAY AFFECT RESULT  07/17/2014 11:01 AM 5.6* 3.7 - 5.3 mEq/L Final    Phosphorus  Date/Time Value Ref Range Status  09/23/2011  8:30 AM 2.4  2.3 - 4.6 mg/dL Final    Magnesium  Date/Time Value Ref Range Status  07/02/2014  8:00 AM 1.9  1.5 - 2.5 mg/dL Final  05/22/2014  5:00 AM 2.4  1.5 - 2.5 mg/dL Final  05/21/2014  4:56 AM 1.8  1.5 - 2.5 mg/dL Final     Wt Readings from Last 15 Encounters:  09/27/14 219 lb (99.338 kg)  09/10/14 218 lb 1.9 oz (98.939 kg)  08/30/14 219 lb 14.4 oz (99.746 kg)  08/06/14 218 lb 6.4 oz (99.066 kg)  08/02/14 220 lb (99.791 kg)  07/24/14 213 lb (96.616 kg)  07/19/14 210 lb 5.1 oz (95.4 kg)  07/02/14 210 lb (95.255 kg)  06/06/14 217 lb (98.431 kg)  06/05/14 215 lb 12.8 oz (97.886 kg)  05/22/14 220 lb 7.4 oz (100 kg)  04/05/14 218 lb (98.884 kg)  03/06/14 219 lb 6.4 oz (99.519 kg)  03/01/14 218 lb 4.1 oz (99 kg)  02/22/14 213 lb 3.2 oz (96.707 kg)    Body mass index is 31.42 kg/(m^2). Patient meets criteria for obesity class I based on current BMI. Weight is stable.  Appetite  Reported as good.Current diet order is Heart Healthy / CHO Modified  patient is consuming approximately 50-75% of meals at this time. Labs and medications reviewed.   No nutrition interventions warranted at this time. If nutrition issues arise, please consult RD.   Colman Cater  MS,RD,CSG,LDN Office: [phone] Pager: [phone]

## 2014-09-27 NOTE — H&P (Signed)
Patient seen and examined. Agree with note by Dyanne Carrel, NP. Patient here with dyspnea on exertion. Has a history of ischemic cardiomyopathy with an ejection fraction of 15-20% with severe diffuse hypokinesis, coronary artery disease status post stenting to the LAD in 2001, atrial fibrillation on amiodarone, hypertension, diabetes among other things. His clinical exam and x-ray findings are most consistent with CHF exacerbation. Has already had good diuresis with Lasix and has decreased oxygen requirements since admission. Continue IV Lasix, strive for negative fluid balance. He has been noted to have a wide-complex tachycardia arrhythmia on telemetry. His pacer/AICD has been interrogated and he is in atrial flutter. Continue amiodarone, digoxin, metoprolol. I wonder whether he has been compliant with his medications. We'll continue to follow.  Robert Mend, MD [facility] Pager: [phone]

## 2014-09-27 NOTE — Progress Notes (Signed)
  Echocardiogram 2D Echocardiogram has been performed.  [place], [place] 09/27/2014, 12:04 PM

## 2014-09-27 NOTE — Progress Notes (Addendum)
Notified Dyanne Carrel, NP of patient 15 beat V. Tach via text.  Voiced to her meds was about to be given.  Santiago Glad responded to the text and stated that she would changing some of the medications.  I voiced to her I would wait for new orders.    1025 discussed also with Dr. Jerilee Hoh about the patients V.tach and she is aware of interventions thus far completed voiced to her that the patient has no current c/o chest pain or objectively showing any symptoms.  I will continue to monitor him.

## 2014-09-28 DIAGNOSIS — I5023 Acute on chronic systolic (congestive) heart failure: Secondary | ICD-10-CM | POA: Diagnosis not present

## 2014-09-28 LAB — CBC
HCT: 32.5 % — ABNORMAL LOW (ref 39.0–52.0)
Hemoglobin: 11.1 g/dL — ABNORMAL LOW (ref 13.0–17.0)
MCH: 31.8 pg (ref 26.0–34.0)
MCHC: 34.2 g/dL (ref 30.0–36.0)
MCV: 93.1 fL (ref 78.0–100.0)
Platelets: 143 10*3/uL — ABNORMAL LOW (ref 150–400)
RBC: 3.49 MIL/uL — ABNORMAL LOW (ref 4.22–5.81)
RDW: 13 % (ref 11.5–15.5)
WBC: 6.8 10*3/uL (ref 4.0–10.5)

## 2014-09-28 LAB — GLUCOSE, CAPILLARY
GLUCOSE-CAPILLARY: 226 mg/dL — AB (ref 70–99)
GLUCOSE-CAPILLARY: 221 mg/dL — AB (ref 70–99)
GLUCOSE-CAPILLARY: 230 mg/dL — AB (ref 70–99)
Glucose-Capillary: 183 mg/dL — ABNORMAL HIGH (ref 70–99)

## 2014-09-28 LAB — BASIC METABOLIC PANEL
Anion gap: 15 (ref 5–15)
BUN: 30 mg/dL — AB (ref 6–23)
CO2: 25 mEq/L (ref 19–32)
Calcium: 8.9 mg/dL (ref 8.4–10.5)
Chloride: 99 mEq/L (ref 96–112)
Creatinine, Ser: 2.45 mg/dL — ABNORMAL HIGH (ref 0.50–1.35)
GFR, EST AFRICAN AMERICAN: 31 mL/min — AB (ref >90)
GFR, EST NON AFRICAN AMERICAN: 27 mL/min — AB (ref >90)
GLUCOSE: 170 mg/dL — AB (ref 70–99)
Potassium: 3.2 mEq/L — ABNORMAL LOW (ref 3.7–5.3)
Sodium: 139 mEq/L (ref 137–147)

## 2014-09-28 NOTE — Progress Notes (Signed)
[facility] PROGRESS NOTE  [name] DTO:[id number] DOB: Jun 10, 1956 DOA: 09/27/2014 PCP: Glo Herring., MD  Assessment/Plan: Acute respiratory failure with hypoxemia -Secondary to acute on chronic systolic CHF. -Wean oxygen as tolerated. -Please see below for details.  Acute on chronic systolic CHF -Known ejection fraction of 15-20%. Repeat echocardiogram is pending. -Was given Lasix 40 mg IV twice a day, however has a history of orthostatic hypotension and is again becoming orthostatic and dizzy. As his fluid status has improved, will elect to discontinue Lasix at this time and use when necessary only. It is to be noted that his cardiologist had only recommended Lasix when necessary for this reason as well. -Continue other medications to include metoprolol, amiodarone, digoxin with holding parameters for blood pressure. ACE inhibitor on hold secondary to worsening renal failure. -His device was interrogated due to a wide complex rhythm; he was found to be in atrial flutter with 2:1 conduction. Rate has improved today with administration of rate controlling medications.  Paroxysmal atrial fibrillation -Continue eliquis.  Acute on chronic kidney disease stage III -Baseline creatinine around 1.6-1.8.  -creatinine 2.5 on admission and down to 2.45 on 10/24. -Continue to monitor, suspect secondary to poor outflow state secondary to systolic CHF.  Code Status: Full code Family Communication: Mother at bedside updated on plan of care  Disposition Plan: Home when ready suspect 24-48 hours   Consultants:  None   Antibiotics:  None   Subjective: No complaints, shortness of breath improved.  Objective: Filed Vitals:   09/28/14 0659 09/28/14 1025 09/28/14 1054 09/28/14 1506  BP: 82/54 88/60 90/60  87/65  Pulse: 91 96  81  Temp: 98.1 F (36.7 C)   97.5 F (36.4 C)  TempSrc: Oral   Oral  Resp: 16   16  Height:      Weight:      SpO2: 97%   95%     Intake/Output Summary (Last 24 hours) at 09/28/14 1541 Last data filed at 09/28/14 1200  Gross per 24 hour  Intake    720 ml  Output      0 ml  Net    720 ml   Filed Weights   09/27/14 0519 09/28/14 0500  Weight: 99.338 kg (219 lb) 99.3 kg (218 lb 14.7 oz)    Exam:   General:  Alert, awake, oriented x3  Cardiovascular: Regular rate and rhythm  Respiratory: Clear to auscultation bilaterally, no crackles  Abdomen: Soft, positive bowel sounds  Extremities: Trace bilateral pitting edema   Neurologic:  Grossly intact and nonfocal  Data Reviewed: Basic Metabolic Panel:  Recent Labs Lab 09/27/14 0523 09/28/14 0607  NA 137 139  K 4.0 3.2*  CL 100 99  CO2 19 25  GLUCOSE 299* 170*  BUN 29* 30*  CREATININE 2.51* 2.45*  CALCIUM 9.4 8.9   Liver Function Tests: No results found for this basename: AST, ALT, ALKPHOS, BILITOT, PROT, ALBUMIN,  in the last 168 hours No results found for this basename: LIPASE, AMYLASE,  in the last 168 hours No results found for this basename: AMMONIA,  in the last 168 hours CBC:  Recent Labs Lab 09/27/14 0523 09/28/14 0607  WBC 11.3* 6.8  HGB 12.7* 11.1*  HCT 37.4* 32.5*  MCV 94.0 93.1  PLT 205 143*   Cardiac Enzymes:  Recent Labs Lab 09/27/14 0523 09/27/14 1414 09/27/14 1942  TROPONINI <0.30 <0.30 <0.30   BNP (last 3 results)  Recent Labs  05/19/14 0203 07/18/14 0555 09/27/14 9833  PROBNP 2631.0* 1116.0* 9164.0*   CBG:  Recent Labs Lab 09/27/14 1703 09/27/14 2052 09/28/14 0721 09/28/14 1138  GLUCAP 247* 251* 183* 226*    No results found for this or any previous visit (from the past 240 hour(s)).   Studies: Dg Chest Port 1 View  09/27/2014   CLINICAL DATA:  Shortness of breath. History of rectal cancer and prostate cancer.  EXAM: PORTABLE CHEST - 1 VIEW  COMPARISON:  07/17/2014  FINDINGS: Cardiac pacemaker. Cardiac enlargement. Bilateral perihilar infiltrates, greater on the right, suggesting pneumonia  or edema. This is new since previous study. No blunting of costophrenic angles. No pneumothorax.  IMPRESSION: Cardiac enlargement with bilateral perihilar infiltrates.   Electronically Signed   By: Lucienne Capers M.D.   On: 09/27/2014 06:01    Scheduled Meds: . amiodarone  200 mg Oral Daily  . apixaban  5 mg Oral BID  . aspirin EC  81 mg Oral QHS  . citalopram  20 mg Oral Daily  . digoxin  0.125 mg Oral QODAY  . insulin aspart  0-15 Units Subcutaneous TID WC  . insulin aspart  0-5 Units Subcutaneous QHS  . isosorbide dinitrate  15 mg Oral BID  . metoprolol succinate  25 mg Oral BID  . pantoprazole  40 mg Oral Daily  . ranolazine  1,000 mg Oral BID  . rosuvastatin  40 mg Oral QHS  . sodium chloride  3 mL Intravenous Q12H  . sodium chloride  3 mL Intravenous Q12H   Continuous Infusions:   Principal Problem:   Acute respiratory failure with hypoxia Active Problems:   Acute on chronic clinical systolic heart failure    CKD III   Obesity   Acute-on-chronic renal failure   GERD (gastroesophageal reflux disease)   CAD (coronary artery disease)   Congestive heart failure   Diabetes   Paroxysmal atrial fibrillation    Time spent: 35 minutes. Greater than 50% of this time was spent in direct contact with the patient coordinating care.    Lelon Frohlich  [facility] Pager [phone]  If 7PM-7AM, please contact night-coverage at [email or web address], password [facility] 09/28/2014, 3:41 PM  LOS: 1 day

## 2014-09-28 NOTE — Progress Notes (Signed)
Patient's BP was 86/58 mmHg when checked manually this afternoon. Patient is currently asymptomatic, and has history of orthostatic hypotension. Dr. Jerilee Hoh made aware. Will continue to monitor patient.

## 2014-09-28 NOTE — Plan of Care (Signed)
Problem: Phase I Progression Outcomes Goal: EF % per last Echo/documented,Core Reminder form on chart Outcome: Completed/Met Date Met:  09/28/14 EF 30-35% per 2D echo.

## 2014-09-29 DIAGNOSIS — J9601 Acute respiratory failure with hypoxia: Secondary | ICD-10-CM

## 2014-09-29 LAB — GLUCOSE, CAPILLARY
GLUCOSE-CAPILLARY: 259 mg/dL — AB (ref 70–99)
Glucose-Capillary: 285 mg/dL — ABNORMAL HIGH (ref 70–99)

## 2014-09-29 MED ORDER — DIGOXIN 125 MCG PO TABS: 0.1250 mg | ORAL_TABLET | ORAL | Status: DC

## 2014-09-29 MED ORDER — METOPROLOL SUCCINATE ER 25 MG PO TB24: 25.0000 mg | ORAL_TABLET | Freq: Two times a day (BID) | ORAL | Status: DC

## 2014-09-29 NOTE — Discharge Summary (Signed)
Physician Discharge Summary  [name] WPY:[id number] DOB: 1955/12/17 DOA: 09/27/2014  PCP: Glo Herring., MD  Admit date: 09/27/2014 Discharge date: 09/29/2014  Time spent: 45 minutes  Recommendations for Outpatient Follow-up:  -Will be discharged home today. -Advised to follow up with his cardiologist in 2 weeks.   Discharge Diagnoses:  Principal Problem:   Acute respiratory failure with hypoxia Active Problems:   Acute on chronic clinical systolic heart failure    CKD III   Obesity   Acute-on-chronic renal failure   GERD (gastroesophageal reflux disease)   CAD (coronary artery disease)   Congestive heart failure   Diabetes   Paroxysmal atrial fibrillation   Discharge Condition: Stable and improved  Filed Weights   09/27/14 0519 09/28/14 0500 09/29/14 0700  Weight: 99.338 kg (219 lb) 99.3 kg (218 lb 14.7 oz) 97.886 kg (215 lb 12.8 oz)    History of present illness:  [name] is a 58 y.o. male with a past medical history of CAD status post stent to LAD in 2001, ischemic cardiomyopathy with ICD, A. fib on amiodarone, hypertension, hyperlipidemia, diabetes, chronic systolic heart failure with an EF of 10-20% with severe diffuse hypokinesis presents to the emergency department with dyspnea and edema. Initial evaluation revealed acute on chronic systolic heart failure, acute on chronic renal failure.  Patient indicates that yesterday he developed sudden orthopnea with coughing thin brownish mucus. He states that for the last 3 months his Lasix has been changed to when necessary based on weight and lower extremity edema 2 to severe hypotension/dehydration/falls in the recent past. He reports he has been compliant with diet and medications and his weight has not fluctuated. He states he took Lasix yesterday and felt a little better but last night worsening dyspnea with cough the he took another Lasix with no relief was unable to sleep all night. By 5 AM he decided  to to come to the emergency department. He denies chest pain except with cough. Associated symptoms do include abdominal distention and lower extremity edema. He does not use oxygen at home. He denies fever chills nausea vomiting diaphoresis. He reports eating and drinking his normal amount. Denies dysuria hematuria frequency or urgency.  Work up in ED incluces CMET significant for creatinine 2.51, serum glucose 299, proBNP 9164, CBC significant for WBC 11.3, dig level <0.3 troponin negative. Chest xray Cardiac enlargement with bilateral perihilar infiltrates.    Hospital Course:   Acute respiratory failure with hypoxemia  -Secondary to acute on chronic systolic CHF.  -No longer with oxygen requirements. -Please see below for details.   Acute on chronic systolic CHF  -Known ejection fraction of 15-20%.   -Was given Lasix 40 mg IV twice a day, however has a history of orthostatic hypotension and was again becoming orthostatic and dizzy on 10/24. As his fluid status improved, we elected to discontinue Lasix at this time and use when necessary only. It is to be noted that his cardiologist had only recommended Lasix when necessary for this reason as well.  -Continue other medications to include metoprolol, amiodarone, digoxin with holding parameters for blood pressure. ACE inhibitor on hold secondary to worsening renal failure but has been restarted on DC. -His device was interrogated due to a wide complex rhythm; he was found to be in atrial flutter with 2:1 conduction. Rate has improved today with administration of rate controlling medications.   Paroxysmal atrial fibrillation  -Continue eliquis.   Acute on chronic kidney disease stage III  -  Baseline creatinine around 1.6-1.8.  -creatinine 2.5 on admission and down to 2.45 on 10/24.  -Continue to monitor, suspect secondary to poor outflow state secondary to systolic CHF.      Procedures:  None   Consultations:  None  Discharge  Instructions  Discharge Instructions   Diet - low sodium heart healthy    Complete by:  As directed      Increase activity slowly    Complete by:  As directed             Medication List    STOP taking these medications       metFORMIN 500 MG tablet  Commonly known as:  GLUCOPHAGE      TAKE these medications       amiodarone 200 MG tablet  Commonly known as:  PACERONE  Take 1 tablet (200 mg total) by mouth daily.     aspirin 81 MG EC tablet  Take 81 mg by mouth at bedtime.     citalopram 20 MG tablet  Commonly known as:  CELEXA  Take 1 tablet (20 mg total) by mouth daily.     co-enzyme Q-10 50 MG capsule  Take 50 mg by mouth every morning.     digoxin 0.125 MG tablet  Commonly known as:  LANOXIN  Take 1 tablet (0.125 mg total) by mouth every other day.     ELIQUIS 5 MG Tabs tablet  Generic drug:  apixaban  Take 5 mg by mouth 2 (two) times daily.     enalapril 2.5 MG tablet  Commonly known as:  VASOTEC  Take 1 tablet (2.5 mg total) by mouth daily.     Fish Oil 1200 MG Caps  Take 1,200 mg by mouth 2 (two) times daily.     fluticasone 50 MCG/ACT nasal spray  Commonly known as:  FLONASE  Place 1 spray into both nostrils daily as needed for allergies.     isosorbide dinitrate 30 MG tablet  Commonly known as:  ISORDIL  Take 15 mg by mouth 2 (two) times daily.     meclizine 25 MG tablet  Commonly known as:  ANTIVERT  Take 25 mg by mouth as needed for dizziness.     metoprolol succinate 25 MG 24 hr tablet  Commonly known as:  TOPROL-XL  Take 1 tablet (25 mg total) by mouth 2 (two) times daily.     niacin 1000 MG CR tablet  Commonly known as:  NIASPAN  Take 1,000 mg by mouth at bedtime.     nitroGLYCERIN 0.4 MG/SPRAY spray  Commonly known as:  NITROLINGUAL  Place 1 spray under the tongue every 5 (five) minutes as needed. angina     pantoprazole 40 MG tablet  Commonly known as:  PROTONIX  Take 1 tablet (40 mg total) by mouth daily.     ranolazine  1000 MG SR tablet  Commonly known as:  RANEXA  Take 1 tablet (1,000 mg total) by mouth 2 (two) times daily.     rosuvastatin 40 MG tablet  Commonly known as:  CRESTOR  Take 1 tablet (40 mg total) by mouth at bedtime.     spironolactone 25 MG tablet  Commonly known as:  ALDACTONE  Take 12.5 mg by mouth daily.       No Known Allergies     Follow-up Information   Follow up with Glo Herring., MD. Schedule an appointment as soon as possible for a visit in 2 weeks.   Specialty:  Internal  Medicine   Contact information:   [address] [place] [address] [phone]        The results of significant diagnostics from this hospitalization (including imaging, microbiology, ancillary and laboratory) are listed below for reference.    Significant Diagnostic Studies: Dg Chest Port 1 View  09/27/2014   CLINICAL DATA:  Shortness of breath. History of rectal cancer and prostate cancer.  EXAM: PORTABLE CHEST - 1 VIEW  COMPARISON:  07/17/2014  FINDINGS: Cardiac pacemaker. Cardiac enlargement. Bilateral perihilar infiltrates, greater on the right, suggesting pneumonia or edema. This is new since previous study. No blunting of costophrenic angles. No pneumothorax.  IMPRESSION: Cardiac enlargement with bilateral perihilar infiltrates.   Electronically Signed   By: Lucienne Capers M.D.   On: 09/27/2014 06:01   US Abdomen Limited Ruq  09/16/2014   CLINICAL DATA:  Right upper quadrant pain for 1 month.  EXAM: US ABDOMEN LIMITED - RIGHT UPPER QUADRANT  COMPARISON:  Abdominal CT 05/20/2014  FINDINGS: Gallbladder:  The gallbladder is decompressed and there is an echogenic focus at the fundus that measures 0.8 cm. Small calcifications in the gallbladder on the previous CT suggest that this echogenic focus is related to a stone. Gallbladder wall appears to be thickened measuring up to 0.5 cm but this may be related to gallbladder contraction. Patient does not have a sonographic Murphy's  sign.  Common bile duct:  Diameter: 0.5 cm  Liver:  The liver parenchyma is slightly heterogeneous without a focal lesion.  IMPRESSION: Echogenic focus in the gallbladder fundus is most compatible with a stone. Gallbladder is contracted and difficult to exclude gallbladder wall thickening.  No biliary dilatation.   Electronically Signed   By: Markus Daft M.D.   On: 09/16/2014 08:58    Microbiology: No results found for this or any previous visit (from the past 240 hour(s)).   Labs: Basic Metabolic Panel:  Recent Labs Lab 09/27/14 0523 09/28/14 0607  NA 137 139  K 4.0 3.2*  CL 100 99  CO2 19 25  GLUCOSE 299* 170*  BUN 29* 30*  CREATININE 2.51* 2.45*  CALCIUM 9.4 8.9   Liver Function Tests: No results found for this basename: AST, ALT, ALKPHOS, BILITOT, PROT, ALBUMIN,  in the last 168 hours No results found for this basename: LIPASE, AMYLASE,  in the last 168 hours No results found for this basename: AMMONIA,  in the last 168 hours CBC:  Recent Labs Lab 09/27/14 0523 09/28/14 0607  WBC 11.3* 6.8  HGB 12.7* 11.1*  HCT 37.4* 32.5*  MCV 94.0 93.1  PLT 205 143*   Cardiac Enzymes:  Recent Labs Lab 09/27/14 0523 09/27/14 1414 09/27/14 1942  TROPONINI <0.30 <0.30 <0.30   BNP: BNP (last 3 results)  Recent Labs  05/19/14 0203 07/18/14 0555 09/27/14 0523  PROBNP 2631.0* 1116.0* 9164.0*   CBG:  Recent Labs Lab 09/28/14 1138 09/28/14 1635 09/28/14 2127 09/29/14 0734 09/29/14 1138  GLUCAP 226* 221* 230* 259* 285*       Signed:  HERNANDEZ ACOSTA,ESTELA  [facility] Pager: [phone] 09/29/2014, 5:07 PM

## 2014-09-29 NOTE — Progress Notes (Signed)
Utilization review Completed Ryian Lynde RN BSN   

## 2014-09-29 NOTE — Progress Notes (Signed)
Patient discharged with instructions, prescription, and care notes.  Verbalized understanding via teach back.  IV was removed and the site was WNL. Patient voiced no further complaints or concerns at the time of discharge.  Appointments scheduled per instructions.  Patient left the floor via w/c with staff and family in stable condition. 

## 2014-09-29 NOTE — Progress Notes (Signed)
Pharmacist Heart Failure Core Measure Documentation  Assessment: [name] has an EF documented as 30-35% on 09/27/14 by echo.  Rationale: Heart failure patients with left ventricular systolic dysfunction (LVSD) and an EF < 40% should be prescribed an angiotensin converting enzyme inhibitor (ACEI) or angiotensin receptor blocker (ARB) at discharge unless a contraindication is documented in the medical record.  This patient is not currently on an ACEI or ARB for HF.  This note is being placed in the record in order to provide documentation that a contraindication to the use of these agents is present for this encounter.  ACE Inhibitor or Angiotensin Receptor Blocker is contraindicated (specify all that apply)  []   ACEI allergy AND ARB allergy []   Angioedema []   Moderate or severe aortic stenosis []   Hyperkalemia []   Hypotension []   Renal artery stenosis [x]   Worsening renal function, preexisting renal disease or dysfunction   Hart Robinsons A 09/29/2014 9:29 AM

## 2014-10-02 NOTE — Progress Notes (Signed)
Quick Note:  US abdomen reviewed. Likely gallstone. No CBD dilation.  Could proceed with HIDA BUT ONLY WITH ENSURE.  Other option is to follow low-fat diet, continue PPI, and pursue HIDA if no improvement in future. ______

## 2014-10-03 ENCOUNTER — Encounter: Payer: Self-pay | Admitting: Internal Medicine

## 2014-10-03 ENCOUNTER — Other Ambulatory Visit: Payer: Self-pay

## 2014-10-03 DIAGNOSIS — K824 Cholesterolosis of gallbladder: Secondary | ICD-10-CM

## 2014-10-03 NOTE — Progress Notes (Signed)
APPT MADE, L/M ON CELL AND MAILED LETTER

## 2014-10-03 NOTE — Progress Notes (Signed)
Quick Note:  Needs OV before scheduling TCS/EGD. ______

## 2014-10-08 ENCOUNTER — Ambulatory Visit (HOSPITAL_COMMUNITY): Payer: Medicare Other

## 2014-10-16 ENCOUNTER — Telehealth: Payer: Self-pay | Admitting: Internal Medicine

## 2014-10-16 NOTE — Telephone Encounter (Signed)
Patient on recall list for Ultrasound in December

## 2014-10-17 NOTE — Telephone Encounter (Signed)
Noted  

## 2014-10-22 ENCOUNTER — Telehealth: Payer: Self-pay

## 2014-10-22 NOTE — Telephone Encounter (Signed)
Pt was on the recall list for Dec for a [place]. Pt just had one in Oct. Does he need another. Also have you had time to call the insurance company for the HIDA scan. Please advise

## 2014-10-22 NOTE — Telephone Encounter (Signed)
Does not need another ultrasound. HIDA approved. See result notes.

## 2014-10-22 NOTE — Progress Notes (Signed)
Quick Note:  He has been approved with confirmation #: [id number]. HIDA needs to be with ENSURE not [facility]. ______

## 2014-10-23 NOTE — Telephone Encounter (Signed)
Pt is schedule for HIDA. See other note

## 2014-10-28 ENCOUNTER — Telehealth: Payer: Self-pay

## 2014-10-28 NOTE — Telephone Encounter (Signed)
Pt called and needed to reschedule his Hidascan. It has been rescheduled for 11/05/2014 @ 10am. Left message on machine.

## 2014-10-29 ENCOUNTER — Ambulatory Visit (HOSPITAL_COMMUNITY): Payer: Medicare Other

## 2014-11-05 ENCOUNTER — Encounter (HOSPITAL_COMMUNITY): Payer: Self-pay

## 2014-11-05 ENCOUNTER — Ambulatory Visit (HOSPITAL_COMMUNITY)
Admission: RE | Admit: 2014-11-05 | Discharge: 2014-11-05 | Disposition: A | Discharge status: Home / Self Care | Payer: Medicare Other | Source: Ambulatory Visit | Attending: Gastroenterology | Admitting: Gastroenterology

## 2014-11-05 DIAGNOSIS — K824 Cholesterolosis of gallbladder: Secondary | ICD-10-CM

## 2014-11-05 DIAGNOSIS — R1011 Right upper quadrant pain: Secondary | ICD-10-CM | POA: Diagnosis not present

## 2014-11-05 DIAGNOSIS — R112 Nausea with vomiting, unspecified: Secondary | ICD-10-CM | POA: Diagnosis not present

## 2014-11-05 MED ORDER — TECHNETIUM TC 99M MEBROFENIN IV KIT
5.0000 | PACK | Freq: Once | INTRAVENOUS | Status: AC | PRN
  Administered 2014-11-05: 5 via INTRAVENOUS

## 2014-11-12 ENCOUNTER — Other Ambulatory Visit: Payer: Self-pay

## 2014-11-12 ENCOUNTER — Encounter: Payer: Self-pay | Admitting: Gastroenterology

## 2014-11-12 ENCOUNTER — Ambulatory Visit (INDEPENDENT_AMBULATORY_CARE_PROVIDER_SITE_OTHER): Payer: Medicare Other | Admitting: Gastroenterology

## 2014-11-12 VITALS — BP 124/58 | HR 45 | Temp 97.0°F | Ht 70.0 in | Wt 219.4 lb

## 2014-11-12 DIAGNOSIS — K219 Gastro-esophageal reflux disease without esophagitis: Secondary | ICD-10-CM

## 2014-11-12 DIAGNOSIS — C189 Malignant neoplasm of colon, unspecified: Secondary | ICD-10-CM

## 2014-11-12 DIAGNOSIS — C20 Malignant neoplasm of rectum: Secondary | ICD-10-CM | POA: Diagnosis not present

## 2014-11-12 DIAGNOSIS — I2589 Other forms of chronic ischemic heart disease: Secondary | ICD-10-CM | POA: Diagnosis not present

## 2014-11-12 MED ORDER — PEG-KCL-NACL-NASULF-NA ASC-C 100 G PO SOLR: 1.0000 | ORAL | Status: DC

## 2014-11-12 NOTE — Patient Instructions (Signed)
We have scheduled you for a colonoscopy and upper endoscopy.   STOP ELIQUIS 2 DAYS BEFORE THE ACTUAL PROCEDURE AND RESUME THE DAY FOLLOWING THE PROCEDURE.

## 2014-11-12 NOTE — Progress Notes (Signed)
  Referring Provider: Fusco, Lawrence, MD Primary Care Physician:  BRANCH, JONATHAN, F, MD  Primary GI: Dr. Rourk   Chief Complaint  Patient presents with  . Follow-up    HPI:   [name] presents today to reschedule colonoscopy and EGD. Past history significant for stage III rectal carcinoma, diagnosed in 2008, s/p APR/chemotherapy radiation therapy. Last colonoscopy in 2012 with single pedunculated benign inflammatory polyp. Due for surveillance this year. Presents today to discuss EGD to rule out occult process due to esophageal thickening incidentally noted on recent CT June 2015. At time of appointment in July for procedure, was direct admit due to afib, orthostasis. Last seen by Dr. Branch 8/28. On Eliquis.   No dysphagia, abdominal pain, N/V, melena, hematochezia. Called into our office in October with RUQ pain. US abdomen with likely gallstone, gallbladder contracted but unable to exclude gallbladder wall thickening. HIDA with EF 57%, no symptoms with Ensure ingestion. No further abdominal pain.    Past Medical History  Diagnosis Date  . Essential hypertension, benign   . Type 2 diabetes mellitus   . CAD (coronary artery disease)     a. BMS to LAD 2001 at [facility] b. PTCA/atherectomy ramus and BMS to LAD 2009  . Chronic systolic heart failure     a. 2D ECHO: 03/01/2014 EF 15-20%, severe, diffuse hypsokinesis. Mod LV dilation. Mod LVH, indeterminate diastolic dysfxn (poor study), high vent filling pressures, abnormal septal wall motion and dyssynergy c/w RV pacing, mild MR, mod LA dilation, RV systolic function mod reduced, mild TR, PA pressure 42.   . GERD (gastroesophageal reflux disease)   . Cardiomyopathy, ischemic     a. BIV ICD St. Jude, LVEF 23%  . Paroxysmal atrial fibrillation     a. on amiodarone, digoxin and Eliquis  . Adenocarcinoma of rectum     a. 2008  . Prostate cancer     a. s/p seed implants with chemo and radiation  . 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 St Jude  . TIA (transient ischemic attack)   . OSA on CPAP   . HLD (hyperlipidemia)   . Orthostatic hypotension   . Dizziness     a. chronic. Admission for this 07/18/2014  . History of falling July 2015    due to dizziness from medications  . CHF (congestive heart failure)   . Renal insufficiency     Past Surgical History  Procedure Laterality Date  . Internal defibrillator and pacemaker  2010    x2 St. Jude device  . Abdominal and perineal resection of rectum with total mesorectal excision  10/04/2007  . Colonoscopy      05/17/2007. IMPRESSION: Semilunar, apple-core neoplasm low in the rectum (palpable on digital rectal exam) beginning at 5 cm and corkscrewing up 5 cm in length. This was a low rectal lesion consistent with colorectal carcinoma. It was biosied multiple times. The upstream colon all the way to the cecum appeared normal. Recommendations: Followup on path. Surgical Consultation   . Colonoscopy  09/14/2011    Dr. Rourk: via colostomy, Single pedunculated benign inflammatory polyp. Due for surveillance Oct 2015  . Colostomy    . Colonoscopy N/A 07/02/2014    Procedure: COLONOSCOPY;  Surgeon: Robert M Rourk, MD;  [facility];  Service: Endoscopy;  Laterality: N/A;  7:30 / COLONOSCOPY THRU COLOSTOMY  . Esophagogastroduodenoscopy N/A 07/02/2014    Procedure: ESOPHAGOGASTRODUODENOSCOPY (EGD);  Surgeon: Robert M Rourk, MD;  [facility];  Service: Endoscopy;  Laterality: N/A;  7:30  . Savory dilation N/A 07/02/2014    Procedure: SAVORY DILATION;  Surgeon: Robert M Rourk, MD;  [facility];  Service: Endoscopy;  Laterality: N/A;  7:30  . Maloney dilation N/A 07/02/2014    Procedure: MALONEY DILATION;  Surgeon: Robert M Rourk, MD;  [facility];  Service: Endoscopy;  Laterality: N/A;  7:30  . Portacath placement  06/2007    "removed ~ 1 yr later"  . Cardiac catheterization  08/2001  . Coronary  angioplasty with stent placement  2001; ~ 2006    "1 + 1"     Current Outpatient Prescriptions  Medication Sig Dispense Refill  . amiodarone (PACERONE) 200 MG tablet Take 1 tablet (200 mg total) by mouth daily. 30 tablet 0  . apixaban (ELIQUIS) 5 MG TABS tablet Take 5 mg by mouth 2 (two) times daily.    . aspirin 81 MG EC tablet Take 81 mg by mouth at bedtime.     . citalopram (CELEXA) 20 MG tablet Take 1 tablet (20 mg total) by mouth daily. 30 tablet 0  . co-enzyme Q-10 50 MG capsule Take 50 mg by mouth every morning.     . digoxin (LANOXIN) 0.125 MG tablet Take 1 tablet (0.125 mg total) by mouth every other day. 30 tablet 6  . enalapril (VASOTEC) 2.5 MG tablet Take 1 tablet (2.5 mg total) by mouth daily. 30 tablet 1  . fluticasone (FLONASE) 50 MCG/ACT nasal spray Place 1 spray into both nostrils daily as needed for allergies.     . isosorbide dinitrate (ISORDIL) 30 MG tablet Take 15 mg by mouth 2 (two) times daily.     . meclizine (ANTIVERT) 25 MG tablet Take 25 mg by mouth as needed for dizziness.    . metFORMIN (GLUCOPHAGE) 500 MG tablet Take 500 mg by mouth 2 (two) times daily with a meal.    . metoprolol succinate (TOPROL-XL) 25 MG 24 hr tablet Take 1 tablet (25 mg total) by mouth 2 (two) times daily. 60 tablet 2  . niacin (NIASPAN) 1000 MG CR tablet Take 1,000 mg by mouth at bedtime.      . nitroGLYCERIN (NITROLINGUAL) 0.4 MG/SPRAY spray Place 1 spray under the tongue every 5 (five) minutes as needed. angina 12 g 3  . Omega-3 Fatty Acids (FISH OIL) 1200 MG CAPS Take 1,200 mg by mouth 2 (two) times daily.      . pantoprazole (PROTONIX) 40 MG tablet Take 1 tablet (40 mg total) by mouth daily. 30 tablet 6  . ranolazine (RANEXA) 1000 MG SR tablet Take 1 tablet (1,000 mg total) by mouth 2 (two) times daily. 60 tablet 9  . rosuvastatin (CRESTOR) 40 MG tablet Take 1 tablet (40 mg total) by mouth at bedtime. 90 tablet 3  . spironolactone (ALDACTONE) 25 MG tablet Take 12.5 mg by mouth daily.       No current facility-administered medications for this visit.    Allergies as of 11/12/2014  . (No Known Allergies)    Family History  Problem Relation Age of Onset  . Colon cancer Mother 70  . Colon cancer Sister 51  . Coronary artery disease Father   . Colon cancer Other     2 cousins, succumbed to illness    History   Social History  . Marital Status: Married    Spouse Name: N/A    Number of Children: 1  . Years of Education: N/A     Occupational History  .     Social History Main Topics  . Smoking status: Never Smoker   . Smokeless tobacco: Never Used  . Alcohol Use: No     Comment: Former user 45 years ago  . Drug Use: No  . Sexual Activity: No   Other Topics Concern  . None   Social History Narrative    Review of Systems: As mentioned in HPI.   Physical Exam: BP 124/58 mmHg  Pulse 45  Temp(Src) 97 F (36.1 C)  Ht 5' 10" (1.778 m)  Wt 219 lb 6.4 oz (99.519 kg)  BMI 31.48 kg/m2 General:   Alert and oriented. No distress noted. Pleasant and cooperative.  Head:  Normocephalic and atraumatic. Eyes:  Conjuctiva clear without scleral icterus. Mouth:  Oral mucosa pink and moist. Good dentition. No lesions Heart:  S1, S2 present without murmurs, rubs, or gallops. Regular rate and rhythm. Abdomen:  +BS, soft, non-tender and non-distended. No rebound or guarding. No HSM or masses noted. LLQ colostomy intact.  Msk:  Symmetrical without gross deformities. Normal posture. Extremities:  Without edema. Neurologic:  Alert and  oriented x4;  grossly normal neurologically. Skin:  Intact without significant lesions or rashes. Psych:  Alert and cooperative. Normal mood and affect.  

## 2014-11-14 ENCOUNTER — Encounter (HOSPITAL_COMMUNITY): Payer: Self-pay | Admitting: Cardiovascular Disease

## 2014-11-14 ENCOUNTER — Ambulatory Visit (INDEPENDENT_AMBULATORY_CARE_PROVIDER_SITE_OTHER): Payer: Medicare Other | Admitting: *Deleted

## 2014-11-14 DIAGNOSIS — I5023 Acute on chronic systolic (congestive) heart failure: Secondary | ICD-10-CM | POA: Diagnosis not present

## 2014-11-14 LAB — MDC_IDC_ENUM_SESS_TYPE_INCLINIC
Battery Remaining Longevity: 15.6 mo
Battery Voltage: 2.54 V
Brady Statistic RA Percent Paced: 0.02 %
Brady Statistic RV Percent Paced: 44 %
Date Time Interrogation Session: 20151210084016
HIGH POWER IMPEDANCE MEASURED VALUE: 42.1071
HighPow Impedance: 42 Ohm
Implantable Pulse Generator Serial Number: 668098
Lead Channel Impedance Value: 375 Ohm
Lead Channel Impedance Value: 562.5 Ohm
Lead Channel Pacing Threshold Amplitude: 0.75 V
Lead Channel Pacing Threshold Amplitude: 1 V
Lead Channel Pacing Threshold Pulse Width: 0.5 ms
Lead Channel Pacing Threshold Pulse Width: 0.5 ms
Lead Channel Pacing Threshold Pulse Width: 0.5 ms
Lead Channel Sensing Intrinsic Amplitude: 3.4 mV
Lead Channel Setting Pacing Amplitude: 2.5 V
Lead Channel Setting Sensing Sensitivity: 0.3 mV
MDC IDC MSMT LEADCHNL LV PACING THRESHOLD AMPLITUDE: 0.75 V
MDC IDC MSMT LEADCHNL LV PACING THRESHOLD PULSEWIDTH: 0.5 ms
MDC IDC MSMT LEADCHNL RA IMPEDANCE VALUE: 375 Ohm
MDC IDC MSMT LEADCHNL RV PACING THRESHOLD AMPLITUDE: 1 V
MDC IDC MSMT LEADCHNL RV SENSING INTR AMPL: 8.3 mV
MDC IDC SET LEADCHNL LV PACING AMPLITUDE: 2 V
MDC IDC SET LEADCHNL LV PACING PULSEWIDTH: 0.5 ms
MDC IDC SET LEADCHNL RA PACING AMPLITUDE: 2 V
MDC IDC SET LEADCHNL RV PACING PULSEWIDTH: 0.5 ms
Zone Setting Detection Interval: 270 ms
Zone Setting Detection Interval: 320 ms
Zone Setting Detection Interval: 400 ms

## 2014-11-14 NOTE — Progress Notes (Signed)
Bi V ICD check in office. 

## 2014-11-14 NOTE — Assessment & Plan Note (Signed)
Original diagnosis in 2008, s/p APR with last colonoscopy in 2012 with single pedunculated inflammatory polyp. Due for high risk surveillance now; both mother and sister with history of colon cancer as well. No lower GI symptoms.   Proceed with TCS (VIA COLOSTOMY) with Dr. Gala Romney in near future: the risks, benefits, and alternatives have been discussed with the patient in detail. The patient states understanding and desires to proceed. Has pacemaker/defibrillator HOLD ELIQUIS X 2 days prior to procedure and resume the day after per cardiology.

## 2014-11-14 NOTE — Assessment & Plan Note (Signed)
Chronic, continue Protonix daily. Incidental finding of esophageal wall thickening on CT in June 2015. Likely secondary to chronic GERD but unable to rule out Barrett's, occult malignancy.   Proceed with upper endoscopy in the near future with Dr. Gala Romney. The risks, benefits, and alternatives have been discussed in detail with patient. They have stated understanding and desire to proceed.  HOLD ELIQUIS X 2 days prior, resume day after procedure

## 2014-11-15 ENCOUNTER — Telehealth: Payer: Self-pay

## 2014-11-15 ENCOUNTER — Other Ambulatory Visit: Payer: Self-pay

## 2014-11-15 NOTE — Telephone Encounter (Signed)
Opened in error

## 2014-11-15 NOTE — Telephone Encounter (Signed)
Spoke with Pt regarding his Pacemaker/defibrillator.  Pt states that he has a St. Jude device. I informed pt to bring card with him to the hospital on the day of the procedure.  Called [place] at [facility] and informed her and Threasa Beards about pts device.

## 2014-11-19 ENCOUNTER — Inpatient Hospital Stay (HOSPITAL_COMMUNITY)
Admission: EM | Admit: 2014-11-19 | Discharge: 2014-11-20 | DRG: 292 | Disposition: A | Discharge status: Home / Self Care | Payer: Medicare Other | Attending: Family Medicine | Admitting: Family Medicine

## 2014-11-19 ENCOUNTER — Encounter (HOSPITAL_COMMUNITY): Payer: Self-pay | Admitting: Emergency Medicine

## 2014-11-19 ENCOUNTER — Emergency Department (HOSPITAL_COMMUNITY): Payer: Medicare Other

## 2014-11-19 DIAGNOSIS — I5023 Acute on chronic systolic (congestive) heart failure: Principal | ICD-10-CM | POA: Diagnosis present

## 2014-11-19 DIAGNOSIS — I517 Cardiomegaly: Secondary | ICD-10-CM | POA: Diagnosis not present

## 2014-11-19 DIAGNOSIS — K219 Gastro-esophageal reflux disease without esophagitis: Secondary | ICD-10-CM | POA: Diagnosis present

## 2014-11-19 DIAGNOSIS — E1165 Type 2 diabetes mellitus with hyperglycemia: Secondary | ICD-10-CM | POA: Diagnosis present

## 2014-11-19 DIAGNOSIS — Z8546 Personal history of malignant neoplasm of prostate: Secondary | ICD-10-CM

## 2014-11-19 DIAGNOSIS — Z7982 Long term (current) use of aspirin: Secondary | ICD-10-CM

## 2014-11-19 DIAGNOSIS — Z794 Long term (current) use of insulin: Secondary | ICD-10-CM

## 2014-11-19 DIAGNOSIS — Z8673 Personal history of transient ischemic attack (TIA), and cerebral infarction without residual deficits: Secondary | ICD-10-CM | POA: Diagnosis not present

## 2014-11-19 DIAGNOSIS — N179 Acute kidney failure, unspecified: Secondary | ICD-10-CM | POA: Diagnosis present

## 2014-11-19 DIAGNOSIS — J811 Chronic pulmonary edema: Secondary | ICD-10-CM | POA: Diagnosis not present

## 2014-11-19 DIAGNOSIS — I255 Ischemic cardiomyopathy: Secondary | ICD-10-CM | POA: Diagnosis present

## 2014-11-19 DIAGNOSIS — Z9119 Patient's noncompliance with other medical treatment and regimen: Secondary | ICD-10-CM | POA: Diagnosis present

## 2014-11-19 DIAGNOSIS — I129 Hypertensive chronic kidney disease with stage 1 through stage 4 chronic kidney disease, or unspecified chronic kidney disease: Secondary | ICD-10-CM | POA: Diagnosis present

## 2014-11-19 DIAGNOSIS — I480 Paroxysmal atrial fibrillation: Secondary | ICD-10-CM | POA: Diagnosis present

## 2014-11-19 DIAGNOSIS — I4892 Unspecified atrial flutter: Secondary | ICD-10-CM | POA: Diagnosis present

## 2014-11-19 DIAGNOSIS — E785 Hyperlipidemia, unspecified: Secondary | ICD-10-CM | POA: Insufficient documentation

## 2014-11-19 DIAGNOSIS — I10 Essential (primary) hypertension: Secondary | ICD-10-CM | POA: Diagnosis present

## 2014-11-19 DIAGNOSIS — I2510 Atherosclerotic heart disease of native coronary artery without angina pectoris: Secondary | ICD-10-CM | POA: Diagnosis present

## 2014-11-19 DIAGNOSIS — N183 Chronic kidney disease, stage 3 (moderate): Secondary | ICD-10-CM | POA: Diagnosis present

## 2014-11-19 DIAGNOSIS — R0602 Shortness of breath: Secondary | ICD-10-CM

## 2014-11-19 DIAGNOSIS — Z85048 Personal history of other malignant neoplasm of rectum, rectosigmoid junction, and anus: Secondary | ICD-10-CM | POA: Diagnosis not present

## 2014-11-19 DIAGNOSIS — G4733 Obstructive sleep apnea (adult) (pediatric): Secondary | ICD-10-CM | POA: Diagnosis present

## 2014-11-19 DIAGNOSIS — Z955 Presence of coronary angioplasty implant and graft: Secondary | ICD-10-CM | POA: Diagnosis not present

## 2014-11-19 DIAGNOSIS — Z950 Presence of cardiac pacemaker: Secondary | ICD-10-CM | POA: Diagnosis not present

## 2014-11-19 DIAGNOSIS — I509 Heart failure, unspecified: Secondary | ICD-10-CM | POA: Insufficient documentation

## 2014-11-19 DIAGNOSIS — Z7901 Long term (current) use of anticoagulants: Secondary | ICD-10-CM | POA: Diagnosis not present

## 2014-11-19 DIAGNOSIS — Z95810 Presence of automatic (implantable) cardiac defibrillator: Secondary | ICD-10-CM | POA: Diagnosis not present

## 2014-11-19 DIAGNOSIS — E119 Type 2 diabetes mellitus without complications: Secondary | ICD-10-CM | POA: Diagnosis not present

## 2014-11-19 DIAGNOSIS — Z91119 Patient's noncompliance with dietary regimen due to unspecified reason: Secondary | ICD-10-CM | POA: Insufficient documentation

## 2014-11-19 DIAGNOSIS — N184 Chronic kidney disease, stage 4 (severe): Secondary | ICD-10-CM | POA: Diagnosis present

## 2014-11-19 DIAGNOSIS — N189 Chronic kidney disease, unspecified: Secondary | ICD-10-CM | POA: Insufficient documentation

## 2014-11-19 DIAGNOSIS — Z9111 Patient's noncompliance with dietary regimen: Secondary | ICD-10-CM | POA: Insufficient documentation

## 2014-11-19 LAB — COMPREHENSIVE METABOLIC PANEL
ALK PHOS: 93 U/L (ref 39–117)
ALT: 43 U/L (ref 0–53)
ANION GAP: 19 — AB (ref 5–15)
AST: 51 U/L — ABNORMAL HIGH (ref 0–37)
Albumin: 3.5 g/dL (ref 3.5–5.2)
BILIRUBIN TOTAL: 1.2 mg/dL (ref 0.3–1.2)
BUN: 27 mg/dL — ABNORMAL HIGH (ref 6–23)
CHLORIDE: 96 meq/L (ref 96–112)
CO2: 22 meq/L (ref 19–32)
Calcium: 10.3 mg/dL (ref 8.4–10.5)
Creatinine, Ser: 2 mg/dL — ABNORMAL HIGH (ref 0.50–1.35)
GFR, EST AFRICAN AMERICAN: 40 mL/min — AB (ref >90)
GFR, EST NON AFRICAN AMERICAN: 35 mL/min — AB (ref >90)
GLUCOSE: 385 mg/dL — AB (ref 70–99)
POTASSIUM: 4.5 meq/L (ref 3.7–5.3)
Sodium: 137 mEq/L (ref 137–147)
TOTAL PROTEIN: 8.1 g/dL (ref 6.0–8.3)

## 2014-11-19 LAB — TROPONIN I
Troponin I: <0.3 ng/mL (ref <0.30)
Troponin I: <0.3 ng/mL (ref <0.30)

## 2014-11-19 LAB — TSH: TSH: 4.72 u[IU]/mL — AB (ref 0.350–4.500)

## 2014-11-19 LAB — CBC WITH DIFFERENTIAL/PLATELET
Basophils Absolute: 0 10*3/uL (ref 0.0–0.1)
Basophils Relative: 0 % (ref 0–1)
EOS ABS: 0.2 10*3/uL (ref 0.0–0.7)
Eosinophils Relative: 2 % (ref 0–5)
HCT: 38.7 % — ABNORMAL LOW (ref 39.0–52.0)
HEMOGLOBIN: 13 g/dL (ref 13.0–17.0)
LYMPHS ABS: 1.1 10*3/uL (ref 0.7–4.0)
LYMPHS PCT: 11 % — AB (ref 12–46)
MCH: 31 pg (ref 26.0–34.0)
MCHC: 33.6 g/dL (ref 30.0–36.0)
MCV: 92.4 fL (ref 78.0–100.0)
MONOS PCT: 5 % (ref 3–12)
Monocytes Absolute: 0.5 10*3/uL (ref 0.1–1.0)
NEUTROS PCT: 82 % — AB (ref 43–77)
Neutro Abs: 7.7 10*3/uL (ref 1.7–7.7)
Platelets: 182 10*3/uL (ref 150–400)
RBC: 4.19 MIL/uL — AB (ref 4.22–5.81)
RDW: 13.6 % (ref 11.5–15.5)
WBC: 9.5 10*3/uL (ref 4.0–10.5)

## 2014-11-19 LAB — GLUCOSE, CAPILLARY
Glucose-Capillary: 394 mg/dL — ABNORMAL HIGH (ref 70–99)
Glucose-Capillary: 283 mg/dL — ABNORMAL HIGH (ref 70–99)

## 2014-11-19 LAB — PRO B NATRIURETIC PEPTIDE: Pro B Natriuretic peptide (BNP): 5233 pg/mL — ABNORMAL HIGH (ref 0–125)

## 2014-11-19 LAB — HEMOGLOBIN A1C
HEMOGLOBIN A1C: 10 % — AB (ref <5.7)
Mean Plasma Glucose: 240 mg/dL — ABNORMAL HIGH (ref <117)

## 2014-11-19 LAB — DIGOXIN LEVEL: Digoxin Level: 0.7 ng/mL — ABNORMAL LOW (ref 0.8–2.0)

## 2014-11-19 IMAGING — CR DG CHEST 2V
2 series · 2 of 2 positions shown · non-contrast
Comparison: 04/11/2012

CLINICAL DATA: Shortness of breath, chest pain.

CHEST - 2 VIEW

[w chest lat]
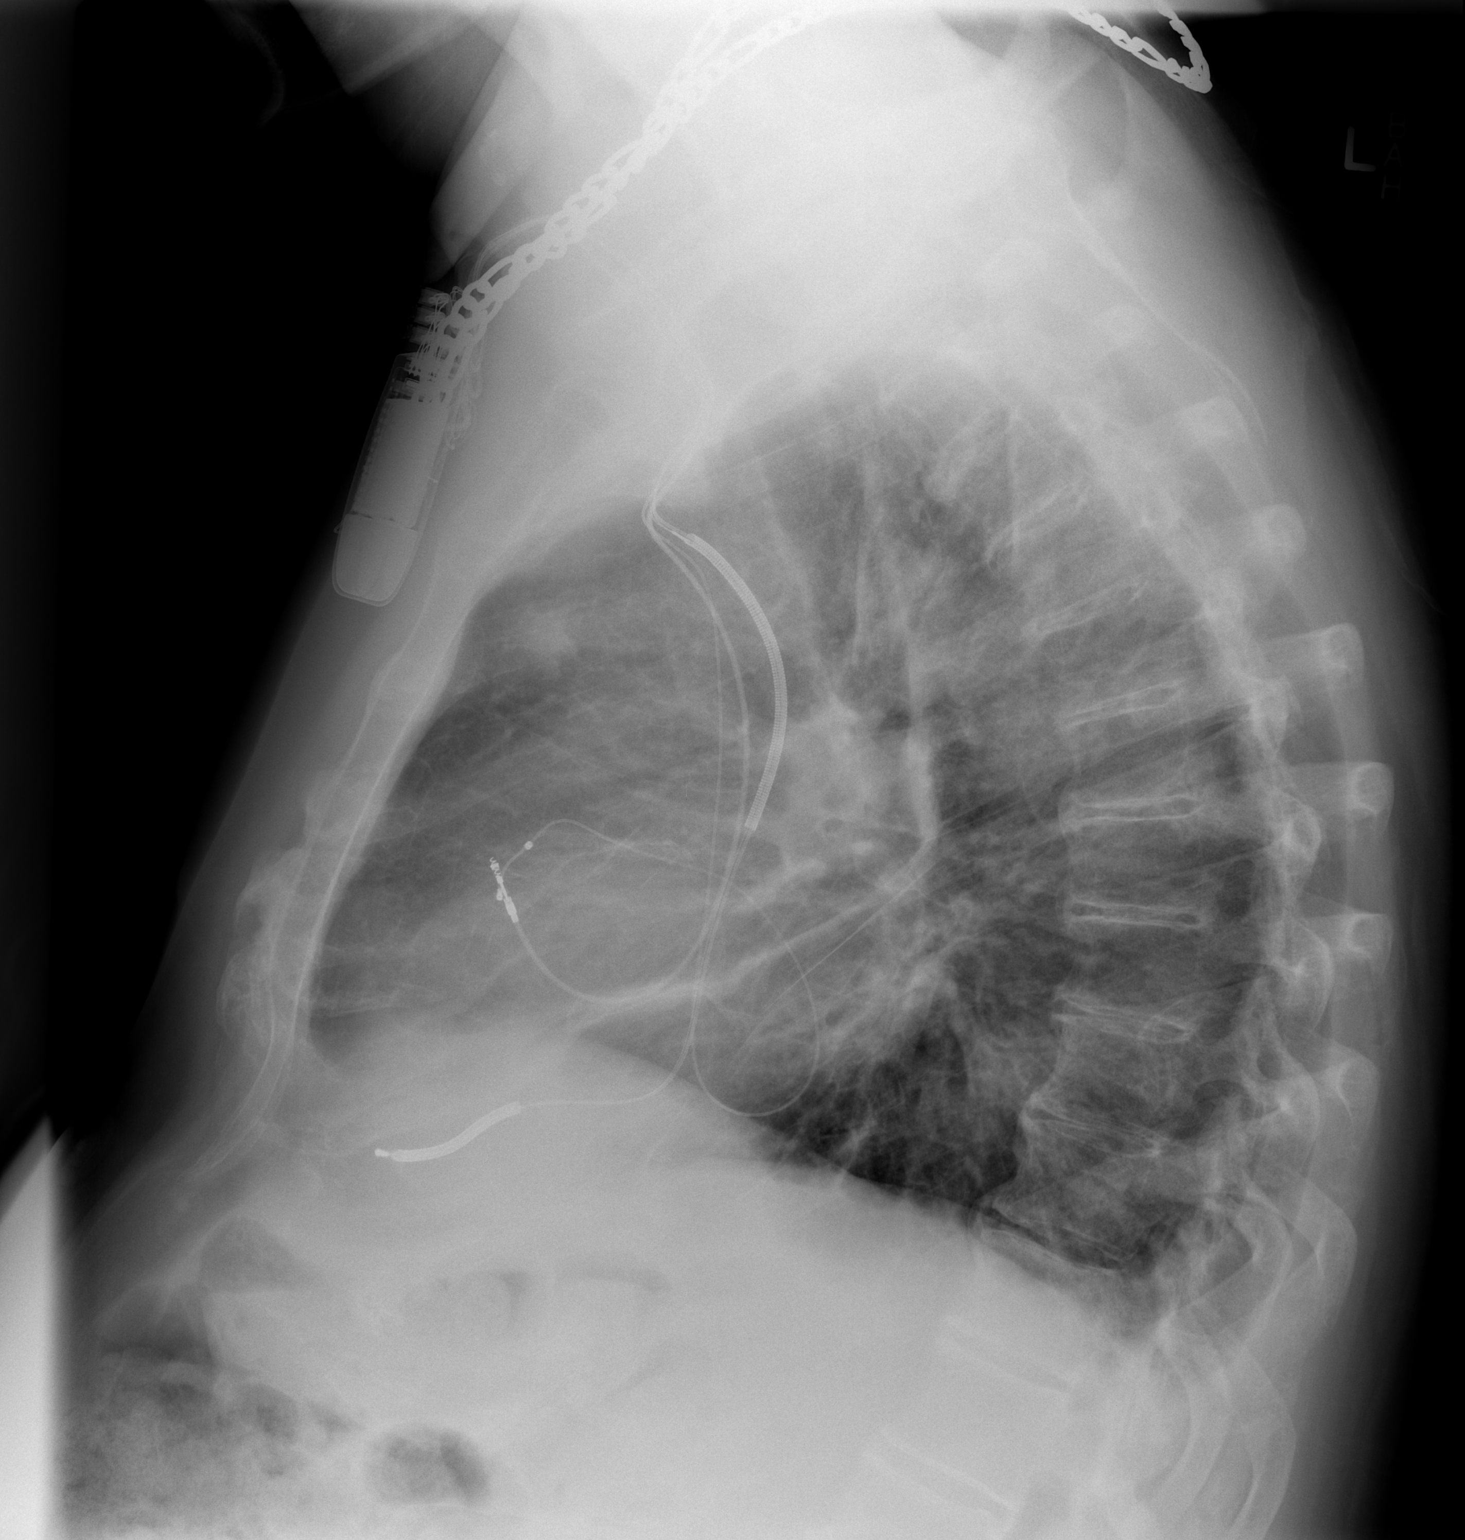

[w chest pa]
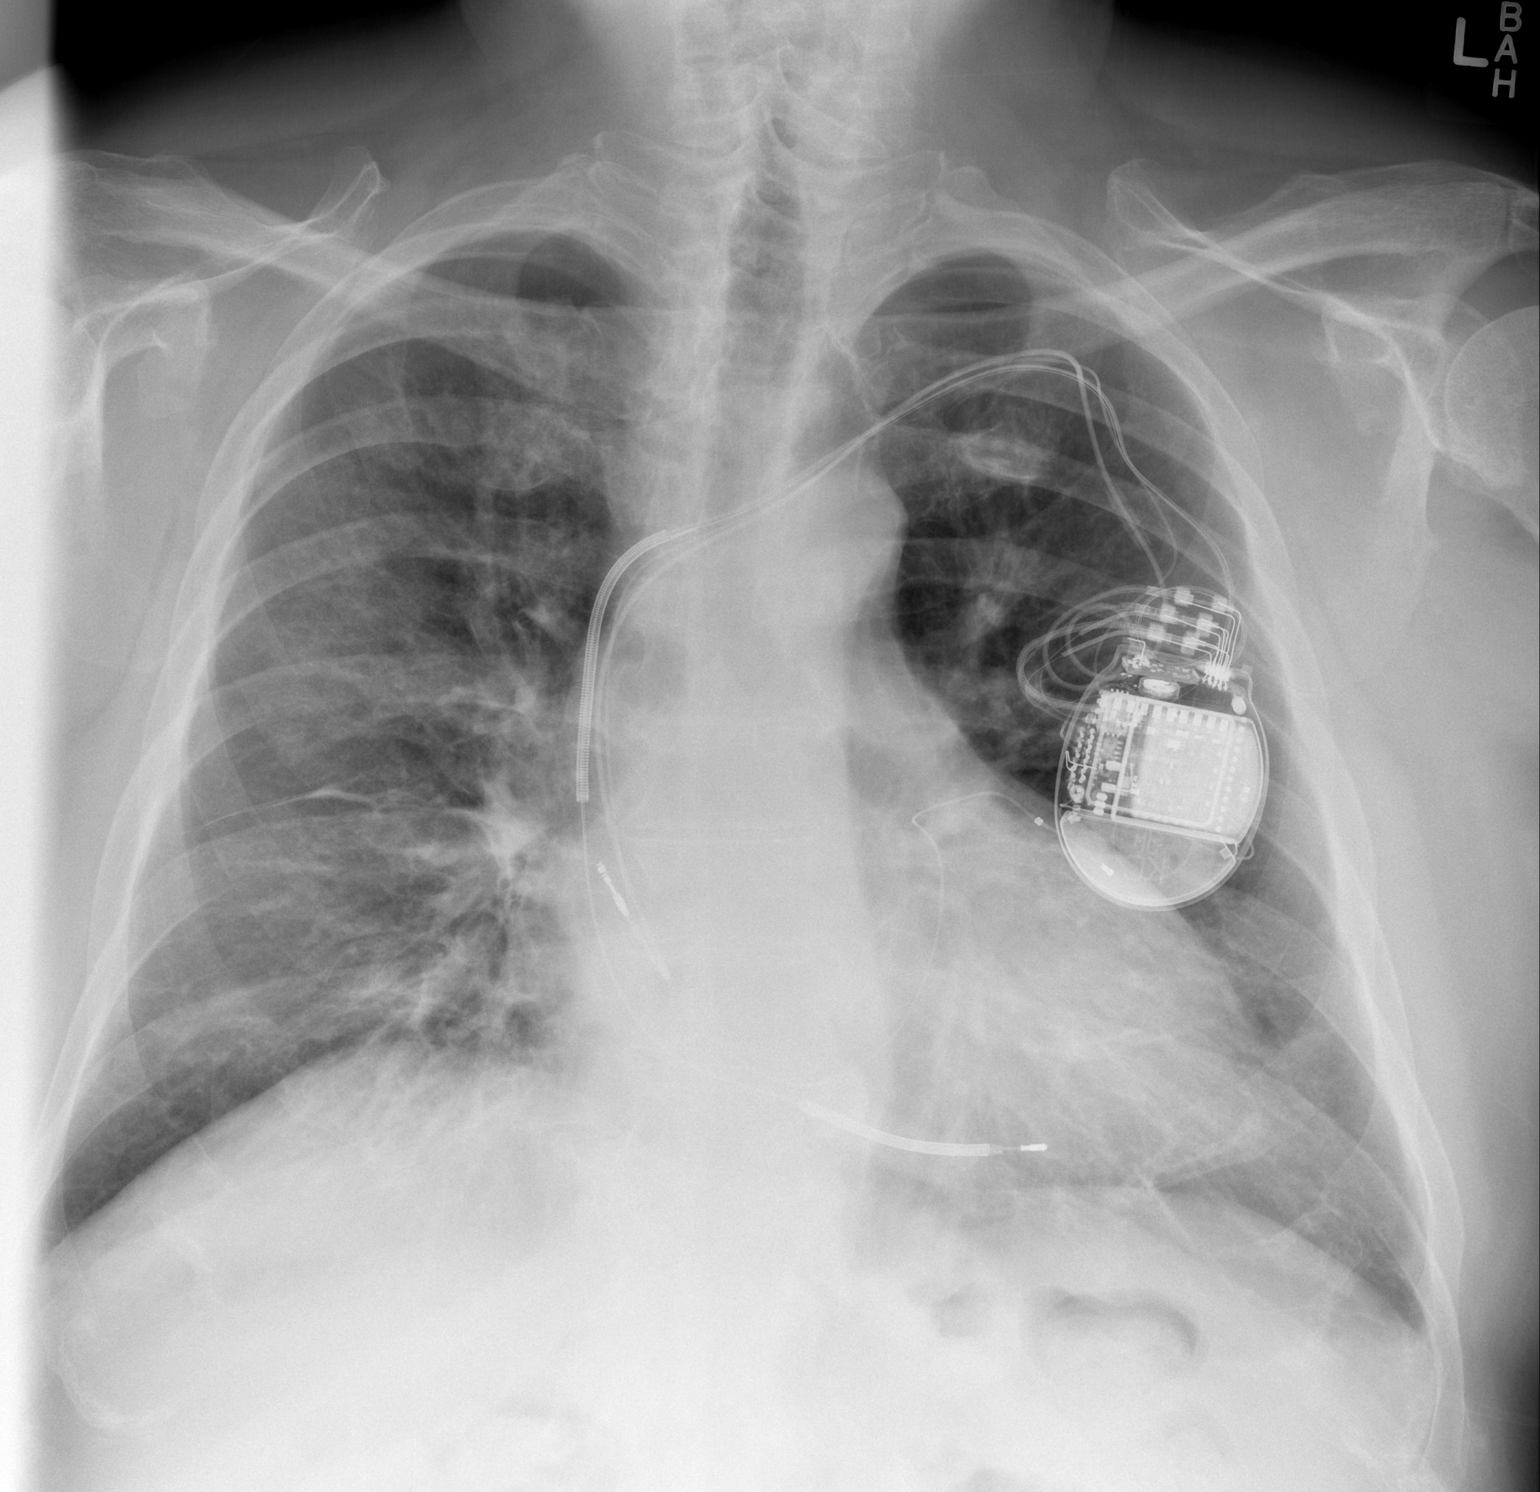

[2 of 2 positions shown; findings below may reference images not displayed]

FINDINGS: Left AICD is in place, unchanged.  Mild cardiomegaly.
Mild peribronchial thickening.  No confluent airspace opacities or
effusions.  No acute bony abnormality.
IMPRESSION: Mild cardiomegaly.  Bronchitic changes.

## 2014-11-19 MED ORDER — SODIUM CHLORIDE 0.9 % IJ SOLN
3.0000 mL | INTRAMUSCULAR | Status: DC | PRN
  Administered 2014-11-20: 3 mL via INTRAVENOUS
  Filled 2014-11-19: qty 3

## 2014-11-19 MED ORDER — INSULIN ASPART 100 UNIT/ML ~~LOC~~ SOLN
0.0000 [IU] | Freq: Every day | SUBCUTANEOUS | Status: DC
  Administered 2014-11-19: 3 [IU] via SUBCUTANEOUS

## 2014-11-19 MED ORDER — INSULIN ASPART 100 UNIT/ML ~~LOC~~ SOLN
0.0000 [IU] | Freq: Three times a day (TID) | SUBCUTANEOUS | Status: DC
  Administered 2014-11-19 – 2014-11-20 (×2): 9 [IU] via SUBCUTANEOUS
  Administered 2014-11-20: 5 [IU] via SUBCUTANEOUS

## 2014-11-19 MED ORDER — METOPROLOL SUCCINATE ER 25 MG PO TB24: 25.0000 mg | ORAL_TABLET | Freq: Every day | ORAL | Status: DC

## 2014-11-19 MED ORDER — AMIODARONE HCL 200 MG PO TABS: 200.0000 mg | ORAL_TABLET | Freq: Every day | ORAL | Status: DC

## 2014-11-19 MED ORDER — ALBUTEROL SULFATE (2.5 MG/3ML) 0.083% IN NEBU: 2.5000 mg | INHALATION_SOLUTION | RESPIRATORY_TRACT | Status: DC | PRN

## 2014-11-19 MED ORDER — ASPIRIN EC 81 MG PO TBEC
81.0000 mg | DELAYED_RELEASE_TABLET | Freq: Every day | ORAL | Status: DC
  Administered 2014-11-19: 81 mg via ORAL
  Filled 2014-11-19: qty 1

## 2014-11-19 MED ORDER — ONDANSETRON HCL 4 MG/2ML IJ SOLN
4.0000 mg | Freq: Three times a day (TID) | INTRAMUSCULAR | Status: DC | PRN
  Administered 2014-11-19: 4 mg via INTRAVENOUS
  Filled 2014-11-19: qty 2

## 2014-11-19 MED ORDER — APIXABAN 2.5 MG PO TABS: 2.5000 mg | ORAL_TABLET | Freq: Two times a day (BID) | ORAL | Status: DC

## 2014-11-19 MED ORDER — SODIUM CHLORIDE 0.9 % IJ SOLN: 3.0000 mL | Freq: Two times a day (BID) | INTRAMUSCULAR | Status: DC

## 2014-11-19 MED ORDER — ACETAMINOPHEN 650 MG RE SUPP: 650.0000 mg | Freq: Four times a day (QID) | RECTAL | Status: DC | PRN

## 2014-11-19 MED ORDER — PANTOPRAZOLE SODIUM 40 MG PO TBEC
40.0000 mg | DELAYED_RELEASE_TABLET | Freq: Every day | ORAL | Status: DC
  Administered 2014-11-19 – 2014-11-20 (×2): 40 mg via ORAL
  Filled 2014-11-19 (×2): qty 1

## 2014-11-19 MED ORDER — CETYLPYRIDINIUM CHLORIDE 0.05 % MT LIQD
7.0000 mL | Freq: Two times a day (BID) | OROMUCOSAL | Status: DC
  Administered 2014-11-19 (×2): 7 mL via OROMUCOSAL

## 2014-11-19 MED ORDER — SODIUM CHLORIDE 0.9 % IV SOLN
250.0000 mL | INTRAVENOUS | Status: DC | PRN
Start: 2014-11-19 — End: 2014-11-20

## 2014-11-19 MED ORDER — METOPROLOL SUCCINATE ER 25 MG PO TB24
25.0000 mg | ORAL_TABLET | Freq: Two times a day (BID) | ORAL | Status: DC
  Administered 2014-11-19 – 2014-11-20 (×3): 25 mg via ORAL
  Filled 2014-11-19 (×3): qty 1

## 2014-11-19 MED ORDER — ENALAPRIL MALEATE 5 MG PO TABS
2.5000 mg | ORAL_TABLET | Freq: Every day | ORAL | Status: DC
  Administered 2014-11-19 – 2014-11-20 (×2): 2.5 mg via ORAL
  Filled 2014-11-19 (×2): qty 1

## 2014-11-19 MED ORDER — AMIODARONE HCL 200 MG PO TABS
200.0000 mg | ORAL_TABLET | Freq: Every day | ORAL | Status: DC
  Administered 2014-11-19 – 2014-11-20 (×2): 200 mg via ORAL
  Filled 2014-11-19 (×2): qty 1

## 2014-11-19 MED ORDER — FUROSEMIDE 10 MG/ML IJ SOLN
40.0000 mg | INTRAMUSCULAR | Status: AC
  Administered 2014-11-19: 40 mg via INTRAVENOUS
  Filled 2014-11-19: qty 4

## 2014-11-19 MED ORDER — CETYLPYRIDINIUM CHLORIDE 0.05 % MT LIQD
7.0000 mL | Freq: Two times a day (BID) | OROMUCOSAL | Status: DC
  Administered 2014-11-19: 7 mL via OROMUCOSAL

## 2014-11-19 MED ORDER — ONDANSETRON HCL 4 MG/2ML IJ SOLN: 4.0000 mg | Freq: Four times a day (QID) | INTRAMUSCULAR | Status: DC | PRN

## 2014-11-19 MED ORDER — RANOLAZINE ER 500 MG PO TB12
1000.0000 mg | ORAL_TABLET | Freq: Two times a day (BID) | ORAL | Status: DC
  Administered 2014-11-19 – 2014-11-20 (×3): 1000 mg via ORAL
  Filled 2014-11-19 (×3): qty 2

## 2014-11-19 MED ORDER — NIACIN ER (ANTIHYPERLIPIDEMIC) 500 MG PO TBCR
1000.0000 mg | EXTENDED_RELEASE_TABLET | Freq: Every day | ORAL | Status: DC
  Administered 2014-11-19: 1000 mg via ORAL
  Filled 2014-11-19 (×3): qty 1

## 2014-11-19 MED ORDER — CITALOPRAM HYDROBROMIDE 20 MG PO TABS
20.0000 mg | ORAL_TABLET | Freq: Every day | ORAL | Status: DC
  Administered 2014-11-19 – 2014-11-20 (×2): 20 mg via ORAL
  Filled 2014-11-19 (×2): qty 1

## 2014-11-19 MED ORDER — ISOSORBIDE DINITRATE 5 MG PO TABS: 15.0000 mg | ORAL_TABLET | Freq: Two times a day (BID) | ORAL | Status: DC | PRN

## 2014-11-19 MED ORDER — FUROSEMIDE 10 MG/ML IJ SOLN
20.0000 mg | Freq: Two times a day (BID) | INTRAMUSCULAR | Status: DC
  Administered 2014-11-19 – 2014-11-20 (×2): 20 mg via INTRAVENOUS
  Filled 2014-11-19 (×2): qty 2

## 2014-11-19 MED ORDER — APIXABAN 5 MG PO TABS: 5.0000 mg | ORAL_TABLET | Freq: Two times a day (BID) | ORAL | Status: DC

## 2014-11-19 MED ORDER — ONDANSETRON HCL 4 MG PO TABS: 4.0000 mg | ORAL_TABLET | Freq: Four times a day (QID) | ORAL | Status: DC | PRN

## 2014-11-19 MED ORDER — METOPROLOL TARTRATE 1 MG/ML IV SOLN
5.0000 mg | Freq: Once | INTRAVENOUS | Status: DC
  Filled 2014-11-19: qty 5

## 2014-11-19 MED ORDER — SODIUM CHLORIDE 0.9 % IJ SOLN
3.0000 mL | Freq: Two times a day (BID) | INTRAMUSCULAR | Status: DC
  Administered 2014-11-19 – 2014-11-20 (×3): 3 mL via INTRAVENOUS

## 2014-11-19 MED ORDER — ACETAMINOPHEN 325 MG PO TABS: 650.0000 mg | ORAL_TABLET | Freq: Four times a day (QID) | ORAL | Status: DC | PRN

## 2014-11-19 MED ORDER — APIXABAN 5 MG PO TABS
5.0000 mg | ORAL_TABLET | Freq: Two times a day (BID) | ORAL | Status: DC
  Administered 2014-11-19 – 2014-11-20 (×2): 5 mg via ORAL
  Filled 2014-11-19 (×2): qty 1

## 2014-11-19 MED ORDER — DIGOXIN 125 MCG PO TABS
0.1250 mg | ORAL_TABLET | ORAL | Status: DC
  Administered 2014-11-20: 0.125 mg via ORAL
  Filled 2014-11-19: qty 1

## 2014-11-19 MED ORDER — ROSUVASTATIN CALCIUM 20 MG PO TABS
40.0000 mg | ORAL_TABLET | Freq: Every day | ORAL | Status: DC
  Administered 2014-11-19: 40 mg via ORAL
  Filled 2014-11-19: qty 2

## 2014-11-19 MED ORDER — ISOSORBIDE DINITRATE 10 MG PO TABS
30.0000 mg | ORAL_TABLET | Freq: Two times a day (BID) | ORAL | Status: DC
Start: 2014-11-19 — End: 2014-11-20
  Administered 2014-11-19 – 2014-11-20 (×3): 30 mg via ORAL
  Filled 2014-11-19 (×7): qty 3

## 2014-11-19 MED ORDER — SPIRONOLACTONE 25 MG PO TABS
12.5000 mg | ORAL_TABLET | Freq: Every day | ORAL | Status: DC
  Administered 2014-11-19 – 2014-11-20 (×2): 12.5 mg via ORAL
  Filled 2014-11-19 (×2): qty 1

## 2014-11-19 NOTE — Care Management Utilization Note (Signed)
UR complete 

## 2014-11-19 NOTE — ED Notes (Signed)
Pt c/o sob and abd swelling since 0200. Pt has h/s chf, took lasix 20mg  po at 0200.

## 2014-11-19 NOTE — H&P (Signed)
PCP:   Carlyle Dolly, F, MD   Chief Complaint:  Shortness of breath  HPI:  58 year old male who  has a past medical history of Essential hypertension, benign; Type 2 diabetes mellitus; CAD (coronary artery disease); Chronic systolic heart failure; GERD (gastroesophageal reflux disease); Cardiomyopathy, ischemic; Paroxysmal atrial fibrillation; Adenocarcinoma of rectum; Prostate cancer; 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 Today presents to the ED with worsening shortness of breath which started last night. Patient also endorses orthopnea, cough. He denies chest pain had nausea but no vomiting or diarrhea. Patient has a history of systolic heart failure, last echo from October 2015 showed EF in the range of 30-35%. As per patient his Lasix was stopped in August of this year, and was told to take Lasix only on as-needed basis. Patient was though taking spironolactone at home. Patient has a colostomy bag in place, has a history of rectal carcinoma which was removed by surgery.  Allergies:  No Known Allergies    Past Medical History  Diagnosis Date  . Essential hypertension, benign   . Type 2 diabetes mellitus   . CAD (coronary artery disease)     a. BMS to LAD 2001 at [facility] b. PTCA/atherectomy ramus and BMS to LAD 2009  . Chronic systolic heart failure     a. 2D ECHO: 03/01/2014 EF 15-20%, severe, diffuse hypsokinesis. Mod LV dilation. Mod LVH, indeterminate diastolic dysfxn (poor study), high vent filling pressures, abnormal septal wall motion and dyssynergy c/w RV pacing, mild MR, mod LA dilation, RV systolic function mod reduced, mild TR, PA pressure 42.   Marland Kitchen GERD (gastroesophageal reflux disease)   . Cardiomyopathy, ischemic     a. BIV ICD St. Jude, LVEF 23%  . Paroxysmal  atrial fibrillation     a. on amiodarone, digoxin and Eliquis  . Adenocarcinoma of rectum     a. 2008  . Prostate cancer     a. s/p seed implants with chemo and radiation  . 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 St Jude  . TIA (transient ischemic attack)   . OSA on CPAP   . HLD (hyperlipidemia)   . Orthostatic hypotension   . Dizziness     a. chronic. Admission for this 07/18/2014  . History of falling July 2015    due to dizziness from medications  . CHF (congestive heart failure)   . Renal insufficiency     Past Surgical History  Procedure Laterality Date  . Internal defibrillator and pacemaker  2010    x2 St. Jude device  . Abdominal and perineal resection of rectum with total mesorectal excision  10/04/2007  . Colonoscopy      05/17/2007. IMPRESSION: Semilunar, apple-core neoplasm low in the rectum (palpable on digital rectal exam) beginning at 5 cm and corkscrewing up 5 cm in length. This was a low rectal lesion consistent with colorectal carcinoma. It was biosied multiple times. The upstream colon all the way to the cecum appeared normal. Recommendations: Followup on path. Surgical Consultation   . Colonoscopy  09/14/2011    Dr. Gala Romney: via colostomy, Single pedunculated benign inflammatory polyp. Due for surveillance Oct 2015  . Colostomy    . Colonoscopy N/A 07/02/2014    Procedure: COLONOSCOPY;  Surgeon: Daneil Dolin, MD;  [facility];  Service: Endoscopy;  Laterality: N/A;  7:30 /  COLONOSCOPY THRU COLOSTOMY  . Esophagogastroduodenoscopy N/A 07/02/2014    Procedure: ESOPHAGOGASTRODUODENOSCOPY (EGD);  Surgeon: Daneil Dolin, MD;  [facility];  Service: Endoscopy;  Laterality: N/A;  7:30  . Savory dilation N/A 07/02/2014    Procedure: SAVORY DILATION;  Surgeon: Daneil Dolin, MD;  [facility];  Service: Endoscopy;  Laterality: N/A;  7:30  . Maloney dilation N/A 07/02/2014    Procedure: Venia Minks DILATION;  Surgeon:  Daneil Dolin, MD;  [facility];  Service: Endoscopy;  Laterality: N/A;  7:30  . Portacath placement  06/2007    "removed ~ 1 yr later"  . Cardiac catheterization  08/2001  . Coronary angioplasty with stent placement  2001; ~ 2006    "1 + 1"   . Left heart catheterization with coronary angiogram N/A 07/13/2013    Procedure: LEFT HEART CATHETERIZATION WITH CORONARY ANGIOGRAM;  Surgeon: Lorretta Harp, MD;  Location: [facility] CATH LAB;  Service: Cardiovascular;  Laterality: N/A;    Prior to Admission medications   Medication Sig Start Date End Date Taking? Authorizing Provider  amiodarone (PACERONE) 200 MG tablet Take 1 tablet (200 mg total) by mouth daily. 05/23/14  Yes Maryann Mikhail, DO  apixaban (ELIQUIS) 5 MG TABS tablet Take 5 mg by mouth 2 (two) times daily.   Yes Historical Provider, MD  aspirin 81 MG EC tablet Take 81 mg by mouth at bedtime.    Yes Historical Provider, MD  citalopram (CELEXA) 20 MG tablet Take 1 tablet (20 mg total) by mouth daily. 07/19/14  Yes Arnoldo Lenis, MD  co-enzyme Q-10 50 MG capsule Take 50 mg by mouth every morning.    Yes Historical Provider, MD  digoxin (LANOXIN) 0.125 MG tablet Take 1 tablet (0.125 mg total) by mouth every other day. 09/29/14  Yes Erline Hau, MD  enalapril (VASOTEC) 2.5 MG tablet Take 1 tablet (2.5 mg total) by mouth daily. 07/19/14  Yes Eileen Stanford, PA-C  fluticasone (FLONASE) 50 MCG/ACT nasal spray Place 1 spray into both nostrils daily as needed for allergies.  11/21/13  Yes Historical Provider, MD  isosorbide dinitrate (ISORDIL) 30 MG tablet Take 15 mg by mouth 2 (two) times daily as needed (shortness of breath).  07/31/14  Yes Historical Provider, MD  meclizine (ANTIVERT) 25 MG tablet Take 25 mg by mouth as needed for dizziness.   Yes Historical Provider, MD  metFORMIN (GLUCOPHAGE) 500 MG tablet Take 500 mg by mouth 2 (two) times daily with a meal.   Yes Historical Provider, MD  metoprolol succinate  (TOPROL-XL) 25 MG 24 hr tablet Take 1 tablet (25 mg total) by mouth 2 (two) times daily. 09/29/14  Yes Erline Hau, MD  niacin (NIASPAN) 1000 MG CR tablet Take 1,000 mg by mouth at bedtime.     Yes Historical Provider, MD  nitroGLYCERIN (NITROLINGUAL) 0.4 MG/SPRAY spray Place 1 spray under the tongue every 5 (five) minutes as needed. angina 07/19/14  Yes Arnoldo Lenis, MD  Omega-3 Fatty Acids (FISH OIL) 1200 MG CAPS Take 1,200 mg by mouth 2 (two) times daily.     Yes Historical Provider, MD  pantoprazole (PROTONIX) 40 MG tablet Take 1 tablet (40 mg total) by mouth daily. 07/19/14  Yes Arnoldo Lenis, MD  ranolazine (RANEXA) 1000 MG SR tablet Take 1 tablet (1,000 mg total) by mouth 2 (two) times daily. 05/13/14  Yes Arnoldo Lenis, MD  rosuvastatin (CRESTOR) 40 MG tablet Take 1 tablet (  40 mg total) by mouth at bedtime. 09/27/14  Yes Arnoldo Lenis, MD  spironolactone (ALDACTONE) 25 MG tablet Take 12.5 mg by mouth daily.   Yes Historical Provider, MD  peg 3350 powder (MOVIPREP) 100 G SOLR Take 1 kit (200 g total) by mouth as directed. Patient not taking: Reported on 11/14/2014 11/12/14   Daneil Dolin, MD    Social History:  reports that he has never smoked. He has never used smokeless tobacco. He reports that he does not drink alcohol or use illicit drugs.  Family History  Problem Relation Age of Onset  . Colon cancer Mother 47  . Colon cancer Sister 41  . Coronary artery disease Father   . Colon cancer Other     2 cousins, succumbed to illness     All the positives are listed in BOLD  Review of Systems:  HEENT: Headache, blurred vision, runny nose, sore throat Neck: Hypothyroidism, hyperthyroidism,,lymphadenopathy Chest : Shortness of breath, history of COPD, Asthma Heart : Chest pain, history of coronary arterey disease GI:  Nausea, vomiting, diarrhea, constipation, GERD GU: Dysuria, urgency, frequency of urination, hematuria Neuro: Stroke, seizures,  syncope Psych: Depression, anxiety, hallucinations   Physical Exam: Blood pressure 121/90, pulse 119, temperature 98.5 F (36.9 C), resp. rate 18, height _0  (1.778 m), weight 99.791 kg (220 lb), SpO2 95 %. Constitutional:   Patient is a well-developed and well-nourished male* in no acute distress and cooperative with exam. Head: Normocephalic and atraumatic Mouth: Mucus membranes moist Eyes: PERRL, EOMI, conjunctivae normal Neck: Supple, No Thyromegaly Cardiovascular: RRR, S1 normal, S2 normal Pulmonary/Chest: Decreased breath sounds at lung bases Abdominal: Soft. Non-tender, non-distended, bowel sounds are normal, no masses, organomegaly, or guarding present.  Neurological: A&O x3, Strength is normal and symmetric bilaterally, cranial nerve II-XII are grossly intact, no focal motor deficit, sensory intact to light touch bilaterally.  Extremities : No Cyanosis, Clubbing or Edema  Labs on Admission:  Basic Metabolic Panel:  Recent Labs Lab 11/19/14 0745  NA 137  K 4.5  CL 96  CO2 22  GLUCOSE 385*  BUN 27*  CREATININE 2.00*  CALCIUM 10.3   Liver Function Tests:  Recent Labs Lab 11/19/14 0745  AST 51*  ALT 43  ALKPHOS 93  BILITOT 1.2  PROT 8.1  ALBUMIN 3.5   No results for input(s): LIPASE, AMYLASE in the last 168 hours. No results for input(s): AMMONIA in the last 168 hours. CBC:  Recent Labs Lab 11/19/14 0745  WBC 9.5  NEUTROABS 7.7  HGB 13.0  HCT 38.7*  MCV 92.4  PLT 182   Cardiac Enzymes:  Recent Labs Lab 11/19/14 0745  TROPONINI <0.30    BNP (last 3 results)  Recent Labs  07/18/14 0555 09/27/14 0523 11/19/14 0745  PROBNP 1116.0* 9164.0* 5233.0*   CBG: No results for input(s): GLUCAP in the last 168 hours.  Radiological Exams on Admission: Dg Chest Portable 1 View  11/19/2014   CLINICAL DATA:  Shortness of breath.  EXAM: PORTABLE CHEST - 1 VIEW  COMPARISON:  09/27/2014  FINDINGS: Stable cardiac enlargement and appearance of a  biventricular pacing/ICD device. Lungs show similar interstitial edema/mild CHF as the prior study. No significant pleural fluid is identified. No evidence of focal pulmonary consolidation or pneumothorax.  IMPRESSION: Interstitial edema/ mild CHF and stable cardiomegaly.   Electronically Signed   By: Aletta Edouard M.D.   On: 11/19/2014 08:04    EKG: Independently reviewed. *Atrial flutter   Assessment/Plan Active Problems:  ADENOCARCINOMA, RECTUM radiation/ chemo/ surg 2008   Essential hypertension   Long term current use of anticoagulant therapy   Acute on chronic clinical systolic heart failure   Atrial flutter    CKD III   CAD (coronary artery disease)   Diabetes   CHF (congestive heart failure)  Acute on chronic systolic heart failure Will start the patient on Lasix 20 g IV every 12 hours. Admit the patient to telemetry obtain cardiac consultation. Will follow the BMP in the morning.  Atrial flutter Patient has atrial flutter, continue anticoagulation with Eliquis. Heart rate is controlled, continue amiodarone, metoprolol.  Diabetes mellitus Hold metformin, will initiate sliding scale insulin with NovoLog.  C KD stage III The patient's creatinine is 2.0 his baseline runs around 2.45. Follow creatinine in the hospital as patient has been started on Lasix.  DVT prophylaxis Patient on full dose anticoagulation with apixaban   Code status: Full code  Family discussion: No family at bedside   Time Spent on Admission: 29 min  [facility] Pager: [phone] 11/19/2014, 9:52 AM  If 7PM-7AM, please contact night-coverage  [email or web address]  Password [email or web address]

## 2014-11-19 NOTE — Consult Note (Signed)
CARDIOLOGY CONSULT NOTE   Patient ID: [name] MRN: [id number] DOB/AGE: 10-Aug-1956 58 y.o.  Admit Date: 11/19/2014 Referring Physician: PTH Primary Physician: Antoine Poche, MD Consulting Cardiologist: Ermalene Searing  Primary Cardiologist : Dina Rich MD [facility] Cardiologist:  Reason for Consultation:Acute on Chronic CHF  Clinical Summary [name] is a 58 y.o.male with complicated cardiac hx to include CAD, PAF ( on Eliquis), Systolic CHF with EF of 30-35% 09/2014, ICM, Dual ICD Pacemaker, followed by both [facility] cardiologist, and by Dr. Wyline Mood, locally. He was last seen in [facility] 2 weeks ago and was doing well. He had sudden onset of dyspnea with PND, orthopnea and abdominal distention last night. Took extra dose of lasix with some diureses, but continued to have dyspnea and stayed up all night. Wanted to come to ER to be checked out before Christmas. States he ate a couple of bowel of stew that he and his wife bought from church and a peanut butter sandwich before going to bed last night.  He has been compliant with medications. Last visit with [facility] in summer found him to be hypovolemic and therefore lasix was discontinued, but he remained on aldactone. He saw Dr. Wyline Mood in October and was stable. Wt at that time 218 lbs.   On arrival to ER his BP was 121/90, HR 117. Labs were essentially unremarkable with the exception of elevated blood glucose of 385, Creatinine of 2.0. AST elevated at 51. GFR 35. Pro-BNP 5233.0. Digoxin level 0.7. CXR interstitial edema mild CHF.  He was given lasix 40 mg IV. Has diuresed 1.5 liters. Wt (I asked them to put him on standing scale) 207 lbs.   He is feeling and breathing much better with diureses, but remains tachycardic. Had not taken his medications this am.   No Known Allergies     PRN Medications: ondansetron (ZOFRAN) IV   Past Medical History  Diagnosis Date  . Essential hypertension,  benign   . Type 2 diabetes mellitus   . CAD (coronary artery disease)     a. BMS to LAD 2001 at [facility] b. PTCA/atherectomy ramus and BMS to LAD 2009  . Chronic systolic heart failure     a. 2D ECHO: 03/01/2014 EF 15-20%, severe, diffuse hypsokinesis. Mod LV dilation. Mod LVH, indeterminate diastolic dysfxn (poor study), high vent filling pressures, abnormal septal wall motion and dyssynergy c/w RV pacing, mild MR, mod LA dilation, RV systolic function mod reduced, mild TR, PA pressure 42.   Marland Kitchen GERD (gastroesophageal reflux disease)   . Cardiomyopathy, ischemic     a. BIV ICD St. Jude, LVEF 23%  . Paroxysmal atrial fibrillation     a. on amiodarone, digoxin and Eliquis  . Adenocarcinoma of rectum     a. 2008  . Prostate cancer     a. s/p seed implants with chemo and radiation  . 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 St Jude  . TIA (transient ischemic attack)   . OSA on CPAP   . HLD (hyperlipidemia)   . Orthostatic hypotension   . Dizziness     a. chronic. Admission for this 07/18/2014  . History of falling July 2015    due to dizziness from medications  . CHF (congestive heart failure)   . Renal insufficiency     Past Surgical History  Procedure Laterality Date  . Internal defibrillator and pacemaker  2010    x2 St. Jude device  .  Abdominal and perineal resection of rectum with total mesorectal excision  10/04/2007  . Colonoscopy      05/17/2007. IMPRESSION: Semilunar, apple-core neoplasm low in the rectum (palpable on digital rectal exam) beginning at 5 cm and corkscrewing up 5 cm in length. This was a low rectal lesion consistent with colorectal carcinoma. It was biosied multiple times. The upstream colon all the way to the cecum appeared normal. Recommendations: Followup on path. Surgical Consultation   . Colonoscopy  09/14/2011    Dr. Jena Gauss: via colostomy, Single pedunculated benign inflammatory polyp. Due for surveillance Oct 2015  . Colostomy    .  Colonoscopy N/A 07/02/2014    Procedure: COLONOSCOPY;  Surgeon: Corbin Ade, MD;  [facility];  Service: Endoscopy;  Laterality: N/A;  7:30 / COLONOSCOPY THRU COLOSTOMY  . Esophagogastroduodenoscopy N/A 07/02/2014    Procedure: ESOPHAGOGASTRODUODENOSCOPY (EGD);  Surgeon: Corbin Ade, MD;  [facility];  Service: Endoscopy;  Laterality: N/A;  7:30  . Savory dilation N/A 07/02/2014    Procedure: SAVORY DILATION;  Surgeon: Corbin Ade, MD;  [facility];  Service: Endoscopy;  Laterality: N/A;  7:30  . Maloney dilation N/A 07/02/2014    Procedure: Elease Hashimoto DILATION;  Surgeon: Corbin Ade, MD;  [facility];  Service: Endoscopy;  Laterality: N/A;  7:30  . Portacath placement  06/2007    "removed ~ 1 yr later"  . Cardiac catheterization  08/2001  . Coronary angioplasty with stent placement  2001; ~ 2006    "1 + 1"   . Left heart catheterization with coronary angiogram N/A 07/13/2013    Procedure: LEFT HEART CATHETERIZATION WITH CORONARY ANGIOGRAM;  Surgeon: [name], MD;  Location: [facility] CATH LAB;  Service: Cardiovascular;  Laterality: N/A;    Family History  Problem Relation Age of Onset  . Colon cancer Mother 83  . Colon cancer Sister 90  . Coronary artery disease Father   . Colon cancer Other     2 cousins, succumbed to illness    Social History Mr. Mclafferty reports that he has never smoked. He has never used smokeless tobacco. Mr. Michiels reports that he does not drink alcohol.  Review of Systems Complete review of systems are found to be negative unless outlined in H&P above.  Physical Examination Blood pressure 115/79, pulse 116, temperature 98.5 F (36.9 C), resp. rate 18, height 5\' 10"  (1.778 m), weight 208 lb 9 oz (94.603 kg), SpO2 97 %. No intake or output data in the 24 hours ending 11/19/14 1040  Telemetry:Atrial fib with pacing sensing, rate of 166 bpm.  JYN:WGNFAOZ comfortably in no acute distress.  HEENT: Conjunctiva  icitric, oropharynx clear with moist mucosa. Neck: Supple, no elevated JVP or carotid bruits, no thyromegaly. Lungs: Clear to auscultation, nonlabored breathing at rest. Cardiac: Irregular rate and rhythm, tachycardic, no S3 or significant systolic murmur, no pericardial rub. Abdomen: Soft, nontender, no hepatomegaly, bowel sounds present, no guarding or rebound. Extremities: No pitting edema, distal pulses 2+. Skin: Warm and dry. Musculoskeletal: No kyphosis. Neuropsychiatric: Alert and oriented x3, affect grossly appropriate.  Prior Cardiac Testing/Procedures  1. Echocardiogram 09/27/2014 Left ventricle: The cavity size was mildly dilated. Wall thickness was increased in a pattern of moderate LVH. Diastolic function is abnormal, indeterminant grade. Systolic function was moderately to severely reduced. The estimated ejection fraction was in the range of 30% to 35%. Evaluation of LVEF is limited by limited visualization of the endocardium, consider contrast study for more  accurate evaluation. - Aorta: The visualized portion of the proximal ascending aorta is mildly dilated measuring 3.8 cm. - Aortic root: The aortic root was normal in size. - Left atrium: The atrium was moderately dilated. - Right ventricle: Not well visualized. Grossly appears moderately enlarged with low normal systolic function. - Right atrium: Not well visualized. Grossly appears moderately enlarged. - Pulmonary arteries: PA peak pressure: 35 mm Hg (S). PASP is borderline elevated. - Inferior vena cava: The vessel was normal in size. The respirophasic diameter changes were in the normal range (>= 50%), consistent with normal central venous pressure. - Technically difficult study.  2. ICD (St Jude-Pacemaker Interrogation) 11/14/2014 CRT-D device check in office. Thresholds and sensing consistent with previous device measurements. Lead impedance trends stable over time. No mode switch  episodes recorded. No ventricular arrhythmia episodes recorded. Patient bi-ventricularly pacing >44 % of the time. Device programmed with appropriate safety margins. Heart failure diagnostics reviewed and trends are stable for patient. A-fib with frequent PVC's, + eliquis. Base rate to 70 today, ventricular pacing LV->RV . Audible/vibratory alerts demonstrated for patient. Estimated longevity 1.1 years. Patient enrolled in remote follow up. Plan to check device remotely in 3 months and see in office in 6 months. Patient education completed including shock plan. ROV in March with Dr. Ladona Ridgel in [place].   Lab Results  Basic Metabolic Panel:  Recent Labs Lab 11/19/14 0745  NA 137  K 4.5  CL 96  CO2 22  GLUCOSE 385*  BUN 27*  CREATININE 2.00*  CALCIUM 10.3    Liver Function Tests:  Recent Labs Lab 11/19/14 0745  AST 51*  ALT 43  ALKPHOS 93  BILITOT 1.2  PROT 8.1  ALBUMIN 3.5    CBC:  Recent Labs Lab 11/19/14 0745  WBC 9.5  NEUTROABS 7.7  HGB 13.0  HCT 38.7*  MCV 92.4  PLT 182    Cardiac Enzymes:  Recent Labs Lab 11/19/14 0745  TROPONINI <0.30    BNP: 5,233.0   Radiology: Dg Chest Portable 1 View  11/19/2014   CLINICAL DATA:  Shortness of breath.  EXAM: PORTABLE CHEST - 1 VIEW  COMPARISON:  09/27/2014  FINDINGS: Stable cardiac enlargement and appearance of a biventricular pacing/ICD device. Lungs show similar interstitial edema/mild CHF as the prior study. No significant pleural fluid is identified. No evidence of focal pulmonary consolidation or pneumothorax.  IMPRESSION: Interstitial edema/ mild CHF and stable cardiomegaly.   Electronically Signed   By: Irish Lack M.D.   On: 11/19/2014 08:04     ECG: Atrial fib/flutter with RBBB rate of 91 bpm.    Impression and Recommendations  1.Acute on Chronic Systolic CHF: Likely related to dietary non-compliance with salty foods yesterday. He denies other indiscretions. He has diuresed 1.5 liters  since IV lasix. He denies medical non-compliance, but I see that dig level is low. Creatinine is elevated at 2.00, may be due to cardiorenal syndrome. Would restart isosorbide, hold off on spironolactone for now with renal failure. Restart amiodarone and metoprolol for HR control. Digoxin level is low. On 0.125 mg at home.  Would restart once creatinine is improved. May do so with diureses. Follow up BMET in am.   2. Atrial fib:  HR is not well controlled currently. Will give one dose of IV lopressor 5 mg and restart home medications, BB, wait on dig,amiodarone. Check TSH.Marland KitchenContinue Eliquis   3. CAD: Hx of prior BMS to LAD in 2001, repeat BMS to LAD to 2009. Denies chest pain. Will  cycle troponin to evaluate for ischemic event precipitating atrial fib vs continue PAF. Continue ASA.  4. Diabetes: Blood glucose elevated. Will check HgB A1C. May need to stop metformin temporarity with elevation of creatinine and continue sliding scale.    5. Acute on chronic renal insufficiency: Review of prior labs has him from 1.33 in June of 2015, to as high as 2.51 in October of 2015.   7. St Jude ICD-BiV pacemaker in situ: Last interrogated on 11/14/2014 with atrial fib present.   Signed: Bettey Mare. Frank Pilger NP [facility]  11/19/2014, 10:40 AM Co-Sign MD

## 2014-11-19 NOTE — ED Provider Notes (Signed)
CSN: [id number]     Arrival date & time 11/19/14  0701 History  This chart was scribed for Johnna Acosta, MD by Edison Simon, ED Scribe. This patient was seen in room APA03/AP[facility]03 and the patient's care was started at 7:39 AM.    Chief Complaint  Patient presents with  . Congestive Heart Failure   The history is provided by the patient. No language interpreter was used.    Symptoms were acute in onset Symptoms are persistent Symptoms are improved with nothing Made worse with nothing Associated symptoms include wheezing    HPI Comments: [name] is a 58 y.o. male with history of CHF who presents to the Emergency Department complaining of SOB with onset at 0200 this morning that woke him up. He reports associated wheezing. He states he vomited once. He states he has a pacemaker defibrillator implanted and was last shocked in 2008. His cardiologist in Dr. Harl Bowie. He states he used Lasix today without improvement. He denies history of COPD or smoking. He states uses Eliquis.   Past Medical History  Diagnosis Date  . Essential hypertension, benign   . Type 2 diabetes mellitus   . CAD (coronary artery disease)     a. BMS to LAD 2001 at [facility] b. PTCA/atherectomy ramus and BMS to LAD 2009  . Chronic systolic heart failure     a. 2D ECHO: 03/01/2014 EF 15-20%, severe, diffuse hypsokinesis. Mod LV dilation. Mod LVH, indeterminate diastolic dysfxn (poor study), high vent filling pressures, abnormal septal wall motion and dyssynergy c/w RV pacing, mild MR, mod LA dilation, RV systolic function mod reduced, mild TR, PA pressure 42.   Marland Kitchen GERD (gastroesophageal reflux disease)   . Cardiomyopathy, ischemic     a. BIV ICD St. Jude, LVEF 23%  . Paroxysmal atrial fibrillation     a. on amiodarone, digoxin and Eliquis  . Adenocarcinoma of rectum     a. 2008  . Prostate cancer     a. s/p seed implants with chemo and radiation  . 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  St Jude  . TIA (transient ischemic attack)   . OSA on CPAP   . HLD (hyperlipidemia)   . Orthostatic hypotension   . Dizziness     a. chronic. Admission for this 07/18/2014  . History of falling July 2015    due to dizziness from medications  . CHF (congestive heart failure)   . Renal insufficiency    Past Surgical History  Procedure Laterality Date  . Internal defibrillator and pacemaker  2010    x2 St. Jude device  . Abdominal and perineal resection of rectum with total mesorectal excision  10/04/2007  . Colonoscopy      05/17/2007. IMPRESSION: Semilunar, apple-core neoplasm low in the rectum (palpable on digital rectal exam) beginning at 5 cm and corkscrewing up 5 cm in length. This was a low rectal lesion consistent with colorectal carcinoma. It was biosied multiple times. The upstream colon all the way to the cecum appeared normal. Recommendations: Followup on path. Surgical Consultation   . Colonoscopy  09/14/2011    Dr. Gala Romney: via colostomy, Single pedunculated benign inflammatory polyp. Due for surveillance Oct 2015  . Colostomy    . Colonoscopy N/A 07/02/2014    Procedure: COLONOSCOPY;  Surgeon: Daneil Dolin, MD;  [facility];  Service: Endoscopy;  Laterality: N/A;  7:30 / COLONOSCOPY THRU COLOSTOMY  . Esophagogastroduodenoscopy N/A 07/02/2014    Procedure:  ESOPHAGOGASTRODUODENOSCOPY (EGD);  Surgeon: Daneil Dolin, MD;  [facility];  Service: Endoscopy;  Laterality: N/A;  7:30  . Savory dilation N/A 07/02/2014    Procedure: SAVORY DILATION;  Surgeon: Daneil Dolin, MD;  [facility];  Service: Endoscopy;  Laterality: N/A;  7:30  . Maloney dilation N/A 07/02/2014    Procedure: Venia Minks DILATION;  Surgeon: Daneil Dolin, MD;  [facility];  Service: Endoscopy;  Laterality: N/A;  7:30  . Portacath placement  06/2007    "removed ~ 1 yr later"  . Cardiac catheterization  08/2001  . Coronary angioplasty with stent placement  2001; ~ 2006     "1 + 1"   . Left heart catheterization with coronary angiogram N/A 07/13/2013    Procedure: LEFT HEART CATHETERIZATION WITH CORONARY ANGIOGRAM;  Surgeon: Lorretta Harp, MD;  Location: [facility] CATH LAB;  Service: Cardiovascular;  Laterality: N/A;   Family History  Problem Relation Age of Onset  . Colon cancer Mother 1  . Colon cancer Sister 12  . Coronary artery disease Father   . Colon cancer Other     2 cousins, succumbed to illness   History  Substance Use Topics  . Smoking status: Never Smoker   . Smokeless tobacco: Never Used  . Alcohol Use: No     Comment: Former user 45 years ago    Review of Systems  Respiratory: Positive for shortness of breath and wheezing.   Cardiovascular: Negative for leg swelling.  All other systems reviewed and are negative.     Allergies  Review of patient's allergies indicates no known allergies.  Home Medications   Prior to Admission medications   Medication Sig Start Date End Date Taking? Authorizing Provider  amiodarone (PACERONE) 200 MG tablet Take 1 tablet (200 mg total) by mouth daily. 05/23/14  Yes Maryann Mikhail, DO  apixaban (ELIQUIS) 5 MG TABS tablet Take 5 mg by mouth 2 (two) times daily.   Yes Historical Provider, MD  aspirin 81 MG EC tablet Take 81 mg by mouth at bedtime.    Yes Historical Provider, MD  citalopram (CELEXA) 20 MG tablet Take 1 tablet (20 mg total) by mouth daily. 07/19/14  Yes Arnoldo Lenis, MD  co-enzyme Q-10 50 MG capsule Take 50 mg by mouth every morning.    Yes Historical Provider, MD  digoxin (LANOXIN) 0.125 MG tablet Take 1 tablet (0.125 mg total) by mouth every other day. 09/29/14  Yes Erline Hau, MD  enalapril (VASOTEC) 2.5 MG tablet Take 1 tablet (2.5 mg total) by mouth daily. 07/19/14  Yes Eileen Stanford, PA-C  fluticasone (FLONASE) 50 MCG/ACT nasal spray Place 1 spray into both nostrils daily as needed for allergies.  11/21/13  Yes Historical Provider, MD  isosorbide dinitrate  (ISORDIL) 30 MG tablet Take 15 mg by mouth 2 (two) times daily as needed (shortness of breath).  07/31/14  Yes Historical Provider, MD  meclizine (ANTIVERT) 25 MG tablet Take 25 mg by mouth as needed for dizziness.   Yes Historical Provider, MD  metFORMIN (GLUCOPHAGE) 500 MG tablet Take 500 mg by mouth 2 (two) times daily with a meal.   Yes Historical Provider, MD  metoprolol succinate (TOPROL-XL) 25 MG 24 hr tablet Take 1 tablet (25 mg total) by mouth 2 (two) times daily. 09/29/14  Yes Erline Hau, MD  niacin (NIASPAN) 1000 MG CR tablet Take 1,000 mg by mouth at bedtime.     Yes  Historical Provider, MD  nitroGLYCERIN (NITROLINGUAL) 0.4 MG/SPRAY spray Place 1 spray under the tongue every 5 (five) minutes as needed. angina 07/19/14  Yes Antoine Poche, MD  Omega-3 Fatty Acids (FISH OIL) 1200 MG CAPS Take 1,200 mg by mouth 2 (two) times daily.     Yes Historical Provider, MD  pantoprazole (PROTONIX) 40 MG tablet Take 1 tablet (40 mg total) by mouth daily. 07/19/14  Yes Antoine Poche, MD  ranolazine (RANEXA) 1000 MG SR tablet Take 1 tablet (1,000 mg total) by mouth 2 (two) times daily. 05/13/14  Yes Antoine Poche, MD  rosuvastatin (CRESTOR) 40 MG tablet Take 1 tablet (40 mg total) by mouth at bedtime. 09/27/14  Yes Antoine Poche, MD  spironolactone (ALDACTONE) 25 MG tablet Take 12.5 mg by mouth daily.   Yes Historical Provider, MD  peg 3350 powder (MOVIPREP) 100 G SOLR Take 1 kit (200 g total) by mouth as directed. Patient not taking: Reported on 11/14/2014 11/12/14   Corbin Ade, MD   BP 121/90 mmHg  Pulse 119  Temp(Src) 98.5 F (36.9 C)  Resp 18  Ht 5\' 10"  (1.778 m)  Wt 220 lb (99.791 kg)  BMI 31.57 kg/m2  SpO2 95% Physical Exam  Constitutional: He appears well-developed and well-nourished. No distress.  HENT:  Head: Normocephalic and atraumatic.  Mouth/Throat: Oropharynx is clear and moist. No oropharyngeal exudate.  Eyes: Conjunctivae and EOM are normal. Pupils  are equal, round, and reactive to light. Right eye exhibits no discharge. Left eye exhibits no discharge. No scleral icterus.  Neck: Normal range of motion. Neck supple. No JVD present. No thyromegaly present.  Cardiovascular: Regular rhythm, normal heart sounds and intact distal pulses.  Exam reveals no gallop and no friction rub.   No murmur heard. Tachycardic Regular wide complex 120 BPM Consistnet, no variation  Pulmonary/Chest: Effort normal and breath sounds normal. No respiratory distress. He has no wheezes. He has no rales.  Abdominal: Soft. Bowel sounds are normal. He exhibits no distension and no mass. There is no tenderness.  Diverting colostomy LLQ  Musculoskeletal: Normal range of motion. He exhibits no edema or tenderness.  Lymphadenopathy:    He has no cervical adenopathy.  Neurological: He is alert. Coordination normal.  Skin: Skin is warm and dry. No rash noted. No erythema.  Psychiatric: He has a normal mood and affect. His behavior is normal.  Nursing note and vitals reviewed.   ED Course  Procedures (including critical care time)  DIAGNOSTIC STUDIES: Oxygen Saturation is 95% on nasal canula, adequate by my interpretation.    COORDINATION OF CARE: 7:43 AM Discussed treatment plan with patient at beside, the patient agrees with the plan and has no further questions at this time.   Labs Review Labs Reviewed  CBC WITH DIFFERENTIAL - Abnormal; Notable for the following:    RBC 4.19 (*)    HCT 38.7 (*)    Neutrophils Relative % 82 (*)    Lymphocytes Relative 11 (*)    All other components within normal limits  COMPREHENSIVE METABOLIC PANEL - Abnormal; Notable for the following:    Glucose, Bld 385 (*)    BUN 27 (*)    Creatinine, Ser 2.00 (*)    AST 51 (*)    GFR calc non Af Amer 35 (*)    GFR calc Af Amer 40 (*)    Anion gap 19 (*)    All other components within normal limits  PRO B NATRIURETIC PEPTIDE - Abnormal;  Notable for the following:    Pro B  Natriuretic peptide (BNP) 5233.0 (*)    All other components within normal limits  DIGOXIN LEVEL - Abnormal; Notable for the following:    Digoxin Level 0.7 (*)    All other components within normal limits  TROPONIN I    Imaging Review Dg Chest Portable 1 View  11/19/2014   CLINICAL DATA:  Shortness of breath.  EXAM: PORTABLE CHEST - 1 VIEW  COMPARISON:  09/27/2014  FINDINGS: Stable cardiac enlargement and appearance of a biventricular pacing/ICD device. Lungs show similar interstitial edema/mild CHF as the prior study. No significant pleural fluid is identified. No evidence of focal pulmonary consolidation or pneumothorax.  IMPRESSION: Interstitial edema/ mild CHF and stable cardiomegaly.   Electronically Signed   By: Aletta Edouard M.D.   On: 11/19/2014 08:04     EKG Interpretation   Date/Time:  Tuesday November 19 2014 08:53:28 EST Ventricular Rate:  91 PR Interval:    QRS Duration: 185 QT Interval:  433 QTC Calculation: 533 R Axis:   -97 Text Interpretation:  Atrial flutter Right bundle branch block  Non-specific ST-t changes Abnormal ekg Since last tracing rate slower  Confirmed by Chere Babson  MD, Rhythm Gubbels ([id number]) on 11/19/2014 9:07:40 AM      ED ECG REPORT  I personally interpreted this EKG   Date: 11/19/2014   Rate: 91  Rhythm: atrial fibrillation  QRS Axis: indeterminate  Intervals: abnormal QRS, PR abnrmal  ST/T Wave abnormalities: nonspecific ST/T changes  Conduction Disutrbances:right bundle branch block  Narrative Interpretation:   Old EKG Reviewed: changes noted   MDM   Final diagnoses:  Shortness of breath  Paroxysmal atrial fibrillation  Acute on chronic systolic congestive heart failure   the patient looks like he is dyspneic, his pulse is 119 bpm, it is gradually improving, this looks like a sinus tachycardia and not ventricular tachycardia, he is requiring oxygen to maintain an oxygen above 92% and he does have pulmonary edema on x-ray. At this point it  appears with his ejection fraction of 30-35% that he would need Lasix, he is having some chest pain, his creatinine is 2 which is slightly elevated from baseline of 1.6-1.8, recently was as high as 2.5 but has improved slightly since then. He does have a slight anion gap, his glucose is 385, I believe that the patient would benefit from inpatient admission at this point. He does not have oxygen at home and has a new mild oxygen requirement.  Pt has had a change in his rhythm, it now shows an irregularly irregular rhtyhm c/w afib, already anticoagulated - d/w Dr. Darrick Meigs who will admit  D/w Dr. Jacinta Shoe - will see in consultation  Meds given in ED:  Medications  furosemide (LASIX) injection 40 mg (40 mg Intravenous Given 11/19/14 0913)      I personally performed the services described in this documentation, which was scribed in my presence. The recorded information has been reviewed and is accurate.    Johnna Acosta, MD 11/19/14 [phone]

## 2014-11-19 NOTE — Care Management Note (Addendum)
    Page 1 of 1   11/20/2014     2:51:15 PM CARE MANAGEMENT NOTE 11/20/2014  Patient:  [name], [name]   Account Number:  [id number]  Date Initiated:  11/19/2014  Documentation initiated by:  Jolene Provost  Subjective/Objective Assessment:   Pt is from Home with self care. Pt lives with sife and has no HH services, DME's or med needs prior to admission. Pt plans to discharge home with self care. No CM needs.     Action/Plan:   Anticipated DC Date:  11/22/2014   Anticipated DC Plan:  [place]  CM consult      Choice offered to / List presented to:             Status of service:  Completed, signed off Medicare Important Message given?   (If response is "NO", the following Medicare IM given date fields will be blank) Date Medicare IM given:   Medicare IM given by:   Date Additional Medicare IM given:   Additional Medicare IM given by:    Discharge Disposition:  HOME/SELF CARE  Per UR Regulation:  Reviewed for med. necessity/level of care/duration of stay  If discussed at [place] of Stay Meetings, dates discussed:    Comments:  11/20/2014 [place], RN, MSN, PCCN Pt being dishcarged home with self care. No CM needs identified. 11/19/2014 [place], RN, MSN, [facility]

## 2014-11-20 DIAGNOSIS — Z9111 Patient's noncompliance with dietary regimen: Secondary | ICD-10-CM | POA: Insufficient documentation

## 2014-11-20 DIAGNOSIS — E785 Hyperlipidemia, unspecified: Secondary | ICD-10-CM | POA: Insufficient documentation

## 2014-11-20 DIAGNOSIS — I480 Paroxysmal atrial fibrillation: Secondary | ICD-10-CM

## 2014-11-20 DIAGNOSIS — N189 Chronic kidney disease, unspecified: Secondary | ICD-10-CM | POA: Insufficient documentation

## 2014-11-20 DIAGNOSIS — Z91119 Patient's noncompliance with dietary regimen due to unspecified reason: Secondary | ICD-10-CM | POA: Insufficient documentation

## 2014-11-20 DIAGNOSIS — I5023 Acute on chronic systolic (congestive) heart failure: Secondary | ICD-10-CM | POA: Insufficient documentation

## 2014-11-20 LAB — LIPID PANEL
Cholesterol: 186 mg/dL (ref 0–200)
HDL: 27 mg/dL — ABNORMAL LOW (ref >39)
LDL Cholesterol: 123 mg/dL — ABNORMAL HIGH (ref 0–99)
TRIGLYCERIDES: 180 mg/dL — AB (ref <150)
Total CHOL/HDL Ratio: 6.9 RATIO
VLDL: 36 mg/dL (ref 0–40)

## 2014-11-20 LAB — COMPREHENSIVE METABOLIC PANEL
ALBUMIN: 2.8 g/dL — AB (ref 3.5–5.2)
ALK PHOS: 52 U/L (ref 39–117)
ALT: 29 U/L (ref 0–53)
AST: 28 U/L (ref 0–37)
Anion gap: 15 (ref 5–15)
BILIRUBIN TOTAL: 1.6 mg/dL — AB (ref 0.3–1.2)
BUN: 28 mg/dL — ABNORMAL HIGH (ref 6–23)
CHLORIDE: 96 meq/L (ref 96–112)
CO2: 27 mEq/L (ref 19–32)
Calcium: 9.5 mg/dL (ref 8.4–10.5)
Creatinine, Ser: 2.02 mg/dL — ABNORMAL HIGH (ref 0.50–1.35)
GFR calc Af Amer: 40 mL/min — ABNORMAL LOW (ref >90)
GFR calc non Af Amer: 34 mL/min — ABNORMAL LOW (ref >90)
Glucose, Bld: 296 mg/dL — ABNORMAL HIGH (ref 70–99)
POTASSIUM: 4 meq/L (ref 3.7–5.3)
SODIUM: 138 meq/L (ref 137–147)
TOTAL PROTEIN: 7.1 g/dL (ref 6.0–8.3)

## 2014-11-20 LAB — CBC
HCT: 35 % — ABNORMAL LOW (ref 39.0–52.0)
Hemoglobin: 11.6 g/dL — ABNORMAL LOW (ref 13.0–17.0)
MCH: 31 pg (ref 26.0–34.0)
MCHC: 33.1 g/dL (ref 30.0–36.0)
MCV: 93.6 fL (ref 78.0–100.0)
PLATELETS: 142 10*3/uL — AB (ref 150–400)
RBC: 3.74 MIL/uL — ABNORMAL LOW (ref 4.22–5.81)
RDW: 13.5 % (ref 11.5–15.5)
WBC: 6.2 10*3/uL (ref 4.0–10.5)

## 2014-11-20 LAB — BASIC METABOLIC PANEL
Anion gap: 14 (ref 5–15)
BUN: 26 mg/dL — AB (ref 6–23)
CHLORIDE: 93 meq/L — AB (ref 96–112)
CO2: 27 meq/L (ref 19–32)
CREATININE: 1.88 mg/dL — AB (ref 0.50–1.35)
Calcium: 9.5 mg/dL (ref 8.4–10.5)
GFR calc Af Amer: 43 mL/min — ABNORMAL LOW (ref >90)
GFR calc non Af Amer: 37 mL/min — ABNORMAL LOW (ref >90)
Glucose, Bld: 327 mg/dL — ABNORMAL HIGH (ref 70–99)
Potassium: 3.8 mEq/L (ref 3.7–5.3)
Sodium: 134 mEq/L — ABNORMAL LOW (ref 137–147)

## 2014-11-20 LAB — GLUCOSE, CAPILLARY
Glucose-Capillary: 289 mg/dL — ABNORMAL HIGH (ref 70–99)
Glucose-Capillary: 386 mg/dL — ABNORMAL HIGH (ref 70–99)

## 2014-11-20 LAB — TROPONIN I

## 2014-11-20 MED ORDER — LIVING WELL WITH DIABETES BOOK
Freq: Once | Status: AC
  Administered 2014-11-20: 1
  Filled 2014-11-20: qty 1

## 2014-11-20 MED ORDER — INSULIN ASPART 100 UNIT/ML ~~LOC~~ SOLN
0.0000 [IU] | Freq: Three times a day (TID) | SUBCUTANEOUS | Status: DC
Start: 2014-11-20 — End: 2014-11-20

## 2014-11-20 MED ORDER — INSULIN GLARGINE 100 UNIT/ML ~~LOC~~ SOLN
20.0000 [IU] | Freq: Every day | SUBCUTANEOUS | Status: DC
  Administered 2014-11-20: 20 [IU] via SUBCUTANEOUS
  Filled 2014-11-20 (×2): qty 0.2

## 2014-11-20 MED ORDER — INSULIN GLARGINE 100 UNIT/ML SOLOSTAR PEN: 20.0000 [IU] | PEN_INJECTOR | Freq: Every day | SUBCUTANEOUS | Status: DC

## 2014-11-20 MED ORDER — INSULIN ASPART 100 UNIT/ML FLEXPEN: 6.0000 [IU] | PEN_INJECTOR | Freq: Three times a day (TID) | SUBCUTANEOUS | Status: DC

## 2014-11-20 MED ORDER — INSULIN ASPART 100 UNIT/ML ~~LOC~~ SOLN: 0.0000 [IU] | Freq: Every day | SUBCUTANEOUS | Status: DC

## 2014-11-20 MED ORDER — INSULIN GLARGINE 100 UNIT/ML ~~LOC~~ SOLN: 20.0000 [IU] | Freq: Every day | SUBCUTANEOUS | Status: DC

## 2014-11-20 NOTE — Progress Notes (Signed)
Discharge instructions and prescriptions given, verbalized understanding, out in stable condition ambulatory with wife.

## 2014-11-20 NOTE — Progress Notes (Signed)
Spoke with patient and his wife about diabetes and home regimen for diabetes control. Patient reports that he is followed by his PCP (Dr. Gerarda Fraction) for diabetes management and currently he takes  Metformin 500 mg BID as an outpatient for diabetes control. Patient states that he use to take insulin but has not taken any in several months. When he was taking insulin he was taking Lantus 24 units QHS, Novolog 14 units with breakfast, Novolog 12 units with lunch, Novolog 14 units with supper.  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 Patient reports that he would prefer to use an insulin pen.  Inquired about knowledge about A1C and patient reports that he knows what an A1C is but does not recall his previous A1C levels.  Discussed A1C results (10.0% on 11/19/14) and reviewed what an A1C is, basic pathophysiology of DM Type 2, basic home care, importance of checking CBGs and maintaining good CBG control to prevent long-term and short-term complications. Stressed acute and long term complications related to uncontrolled diabetes. Discussed impact of nutrition, exercise, stress, sickness, and medications on diabetes control. Discussed hyperglycemia and hypoglycemia and treatment for both. Patient expressed that he has been having a lot of the symptoms associated with hyperglycemia (frequent urination, dry mouth, thirsty) and he reports that he now has a better understanding of the importance of getting his diabetes controlled.  Discussed carbohydrates, carbohydrate goals per day and meal, along with portion sizes. Patient and his wife state they  get very frustrated when trying to decide what to buy at the grocery store because he has to watch Sodium, Carbohydrates, Cholesterol, and Fat. Discussed basics of Carb Modified Heart Healthy diet. Placed consult for RD to provide further diet education and offer handout material for meal planning ideas. Encouraged patient to check his glucose 4 times a day (before meals and at bedtime) and keep a log which he can take with him to his follow up visits so the doctor can make adjustments with his insulin regimen. Also informed patient where he could buy glucose tablets and encouraged him to always keep them close by to help reduce his fears about having a hypoglycemic episode. Patient states that he is not followed by an endocrinologist and he would like to be referred to a local endocrinologist to get help with improving diabetes control. Provided patient with contact information for Dr. Dorris Fetch and asked that he discuss with Dr. Gerarda Fraction so he could be referred to Dr. Dorris Fetch. Ordered Living Well with Diabetes booklet and informed patient it would provided information about diabetes, diet, exercise, as well as the importance of glycemic control. Patient and his wife expressed appreciation of information discussed and provided.  Patient verbalized understanding of information discussed and he states that he has no further questions at this time related to diabetes.   Thanks, Barnie Alderman, RN, MSN, CCRN Diabetes Coordinator Inpatient Diabetes Program [phone] (Team Pager) [phone] ([place] office) [phone] [facility] office)

## 2014-11-20 NOTE — Progress Notes (Signed)
Inpatient Diabetes Program Recommendations  AACE/ADA: New Consensus Statement on Inpatient Glycemic Control (2013)  Target Ranges:  Prepandial:   less than 140 mg/dL      Peak postprandial:   less than 180 mg/dL (1-2 hours)      Critically ill patients:  140 - 180 mg/dL   Results for RALLY, OUCH (MRN [id number]) as of 11/20/2014 09:41  Ref. Range 11/19/2014 16:22 11/19/2014 22:38 11/20/2014 08:07  Glucose-Capillary Latest Range: 70-99 mg/dL 394 (H) 283 (H) 289 (H)   Diabetes history: DM2 Outpatient Diabetes medications: Metformin 500 mg BID Current orders for Inpatient glycemic control: Novolog 0-9 units AC  Inpatient Diabetes Program Recommendations Insulin - Basal: Please consider ordering Lantus 20 units daily (based on 95 kg x 0.2 units). Correction (SSI): Please consider increasing Novolog correction to moderate scale and add Novolog bedtime correction scale.  Thanks, Barnie Alderman, RN, MSN, CCRN, CDE Diabetes Coordinator Inpatient Diabetes Program [phone] (Team Pager) [phone] ([place] office) [phone] [facility] office)

## 2014-11-20 NOTE — Discharge Summary (Addendum)
Physician Discharge Summary  [name] KDT:[id number] DOB: 05-05-1956 DOA: 11/19/2014  PCP: Carlyle Dolly, Wanda Plump, MD  Admit date: 11/19/2014 Discharge date: 11/20/2014  Time spent: 40 minutes  Recommendations for Outpatient Follow-up:  1. Dr Harl Bowie office will contact to schedule appointment.  2. Dr Nolon Rod office will contact to schedule appointment. Recommend evaluation of CBG's as lantus started. Pt reported not being on insulin for 2 years, he ran out and never went for refill. Will likely need meal coverage as well.   Discharge Diagnoses:  Active Problems:   ADENOCARCINOMA, RECTUM radiation/ chemo/ surg 2008   Essential hypertension   Long term current use of anticoagulant therapy   Acute on chronic clinical systolic heart failure   Atrial flutter    CKD III   CAD (coronary artery disease)   Diabetes   CHF (congestive heart failure)   Acute on chronic systolic congestive heart failure   Hyperlipidemia   CKD (chronic kidney disease)   Dietary noncompliance   Discharge Condition: stable  Diet recommendation: carb modified  Filed Weights   11/19/14 1000 11/19/14 1315 11/20/14 0646  Weight: 94.603 kg (208 lb 9 oz) 94.348 kg (208 lb) 95.8 kg (211 lb 3.2 oz)    History of present illness:  58 year old male who  has a past medical history of Essential hypertension, benign; Type 2 diabetes mellitus; CAD (coronary artery disease); Chronic systolic heart failure; GERD (gastroesophageal reflux disease); Cardiomyopathy, ischemic; Paroxysmal atrial fibrillation; Adenocarcinoma of rectum; Prostate cancer; 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 Presented 11/19/14 to the ED with worsening shortness of breath which started night before. Patient also endorsed orthopnea,  cough. He denied chest pain had nausea but no vomiting or diarrhea. Patient had a history of systolic heart failure, last echo from October 2015 showed EF in the range of 30-35%. As per patient his Lasix was stopped in August of this year, and was told to take Lasix only on as-needed basis due to hx orthostatic hypotension with syncope.Taking spironolactone at home. Patient with a colostomy bag in place, has a history of rectal carcinoma which was removed by surgery.  Hospital Course:  Acute on chronic systolic heart failure  Related to dietary indiscretion.  admitted and diuresed with IV lasix. At discharge volume status - 1.8L. Evaluated by cardiology who recommended continuing spironolactone at discharge. Echo on 09/27/14 with EF 30-35%, and recent device interrogation on 11/14/14 demonstrating >44% Bi-V pacing with underlying atrial fibrillation. No indication for repeat cardiac testing at this time per cardiology note. Dr Harl Bowie office will contact for follow up  Atrial flutter Patient has atrial flutter, continue anticoagulation with Eliquis. Heart rate is controlled, continue amiodarone, metoprolol.  Diabetes mellitus Patient reports non-compliance with medications. Reports being on lantus and novolog in past but has not taken in 2 years. A1c 10.0. CBG rnage [id number]. Will add Lantus 20u. Will likely need meal coverage as well. He will resume home metformin. Dr Fusco's office will contact for follow up.   C KD stage III Stable at baseline in spite of lasix. Urine output good.  OP monitoring.  DVT prophylaxis  Procedures:  none  Consultations:  cardiology  Discharge Exam: Filed Vitals:   11/20/14 1112  BP: 98/59  Pulse: 98  Temp:   Resp:     General: well nourished appears comfortable Cardiovascular: irregularly irregular no m/g/r no LE edema  Respiratory: normal effort BS clear bilaterally no crackles  Discharge Instructions You were cared for by a hospitalist during your  hospital stay. If you have any questions about your discharge medications or the care you received while you were in the hospital after you are discharged, you can call the unit and asked to speak with the hospitalist on call if the hospitalist that took care of you is not available. Once you are discharged, your primary care physician will handle any further medical issues. Please note that NO REFILLS for any discharge medications will be authorized once you are discharged, as it is imperative that you return to your primary care physician (or establish a relationship with a primary care physician if you do not have one) for your aftercare needs so that they can reassess your need for medications and monitor your lab values.  Discharge Instructions    Diet - low sodium heart healthy    Complete by:  As directed      Discharge instructions    Complete by:  As directed   Take medications as directed Adhere to diet as instructed Follow up with Dr Harl Bowie as sheduled Dr fusco's office will call for follow up. If they don not call by Friday call office to schedule follow up     Increase activity slowly    Complete by:  As directed           Current Discharge Medication List    START taking these medications   Details  insulin glargine (LANTUS) 100 UNIT/ML injection Inject 0.2 mLs (20 Units total) into the skin daily. Qty: 10 mL, Refills: 11      CONTINUE these medications which have NOT CHANGED   Details  amiodarone (PACERONE) 200 MG tablet Take 1 tablet (200 mg total) by mouth daily. Qty: 30 tablet, Refills: 0    apixaban (ELIQUIS) 5 MG TABS tablet Take 5 mg by mouth 2 (two) times daily.    citalopram (CELEXA) 20 MG tablet Take 1 tablet (20 mg total) by mouth daily. Qty: 30 tablet, Refills: 0    co-enzyme Q-10 50 MG capsule Take 50 mg by mouth every morning.     digoxin (LANOXIN) 0.125 MG tablet Take 1 tablet (0.125 mg total) by mouth every other day. Qty: 30 tablet, Refills: 6     enalapril (VASOTEC) 2.5 MG tablet Take 1 tablet (2.5 mg total) by mouth daily. Qty: 30 tablet, Refills: 1    fluticasone (FLONASE) 50 MCG/ACT nasal spray Place 1 spray into both nostrils daily as needed for allergies.     isosorbide dinitrate (ISORDIL) 30 MG tablet Take 15 mg by mouth 2 (two) times daily as needed (shortness of breath).     meclizine (ANTIVERT) 25 MG tablet Take 25 mg by mouth as needed for dizziness.    metoprolol succinate (TOPROL-XL) 25 MG 24 hr tablet Take 1 tablet (25 mg total) by mouth 2 (two) times daily. Qty: 60 tablet, Refills: 2    niacin (NIASPAN) 1000 MG CR tablet Take 1,000 mg by mouth at bedtime.      nitroGLYCERIN (NITROLINGUAL) 0.4 MG/SPRAY spray Place 1 spray under the tongue every 5 (five) minutes as needed. angina Qty: 12 g, Refills: 3    Omega-3 Fatty Acids (FISH OIL) 1200 MG CAPS Take 1,200 mg by mouth 2 (two) times daily.      pantoprazole (PROTONIX) 40 MG tablet Take 1 tablet (40 mg total) by mouth daily. Qty: 30 tablet, Refills: 6  ranolazine (RANEXA) 1000 MG SR tablet Take 1 tablet (1,000 mg total) by mouth 2 (two) times daily. Qty: 60 tablet, Refills: 9    rosuvastatin (CRESTOR) 40 MG tablet Take 1 tablet (40 mg total) by mouth at bedtime. Qty: 90 tablet, Refills: 3    spironolactone (ALDACTONE) 25 MG tablet Take 12.5 mg by mouth daily.    peg 3350 powder (MOVIPREP) 100 G SOLR Take 1 kit (200 g total) by mouth as directed. Qty: 1 kit, Refills: 0      STOP taking these medications     aspirin 81 MG EC tablet      metFORMIN (GLUCOPHAGE) 500 MG tablet        No Known Allergies Follow-up Information    Follow up with Arnoldo Lenis, MD.   Specialty:  Cardiology   Contact information:   [place] [address] [phone]       Follow up with Glo Herring., MD.   Specialty:  Internal Medicine   Contact information:   [address] [place] [address] [phone]        The  results of significant diagnostics from this hospitalization (including imaging, microbiology, ancillary and laboratory) are listed below for reference.    Significant Diagnostic Studies: Nm Hepatobiliary Including Gb  11/05/2014   CLINICAL DATA:  RIGHT upper quadrant abdominal pain for 2 months, nausea, vomiting  EXAM: NUCLEAR MEDICINE HEPATOBILIARY IMAGING WITH GALLBLADDER EF  TECHNIQUE: Sequential images of the abdomen were obtained out to 60 minutes following intravenous administration of radiopharmaceutical. After oral ingestion of Ensure, gallbladder ejection fraction was determined. At 60 min, normal ejection fraction is greater than 33%.  RADIOPHARMACEUTICALS:  5 mCi Tc-58m Choletec IV  COMPARISON:  None.  FINDINGS: Normal tracer extraction from bloodstream indicating normal hepatocellular function.  Normal excretion of tracer into biliary tree.  Gallbladder visualized at 49 min.  Small bowel visualized at 18 min.  No hepatic retention of tracer.  Subjectively normal emptying of tracer from gallbladder following fatty meal stimulation.  Calculated gallbladder ejection fraction is 57%, normal.  Patient experienced no symptoms following Ensure ingestion.  IMPRESSION: Normal exam.   Electronically Signed   By: Lavonia Dana M.D.   On: 11/05/2014 14:00   Dg Chest Portable 1 View  11/19/2014   CLINICAL DATA:  Shortness of breath.  EXAM: PORTABLE CHEST - 1 VIEW  COMPARISON:  09/27/2014  FINDINGS: Stable cardiac enlargement and appearance of a biventricular pacing/ICD device. Lungs show similar interstitial edema/mild CHF as the prior study. No significant pleural fluid is identified. No evidence of focal pulmonary consolidation or pneumothorax.  IMPRESSION: Interstitial edema/ mild CHF and stable cardiomegaly.   Electronically Signed   By: Aletta Edouard M.D.   On: 11/19/2014 08:04    Microbiology: No results found for this or any previous visit (from the past 240 hour(s)).   Labs: Basic Metabolic  Panel:  Recent Labs Lab 11/19/14 0745 11/20/14 0027 11/20/14 0608  NA 137 134* 138  K 4.5 3.8 4.0  CL 96 93* 96  CO2 $Re'22 27 27  'wvD$ GLUCOSE 385* 327* 296*  BUN 27* 26* 28*  CREATININE 2.00* 1.88* 2.02*  CALCIUM 10.3 9.5 9.5   Liver Function Tests:  Recent Labs Lab 11/19/14 0745 11/20/14 0608  AST 51* 28  ALT 43 29  ALKPHOS 93 52  BILITOT 1.2 1.6*  PROT 8.1 7.1  ALBUMIN 3.5 2.8*   No results for input(s): LIPASE, AMYLASE in the last 168 hours.  No results for input(s): AMMONIA in the last 168 hours. CBC:  Recent Labs Lab 11/19/14 0745 11/20/14 0608  WBC 9.5 6.2  NEUTROABS 7.7  --   HGB 13.0 11.6*  HCT 38.7* 35.0*  MCV 92.4 93.6  PLT 182 142*   Cardiac Enzymes:  Recent Labs Lab 11/19/14 0745 11/19/14 1317 11/19/14 1858 11/20/14 0027  TROPONINI <0.30 <0.30 <0.30 <0.30   BNP: BNP (last 3 results)  Recent Labs  07/18/14 0555 09/27/14 0523 11/19/14 0745  PROBNP 1116.0* 9164.0* 5233.0*   CBG:  Recent Labs Lab 11/19/14 1622 11/19/14 2238 11/20/14 0807 11/20/14 1110  GLUCAP 394* 283* 289* 386*       Signed:  Velvet Bathe  [facility] 11/20/2014, 2:39 PM  Patient seen and evaluated given the elevated serum creatinine we'll hold metformin on discharge. Also since patient is on Epixaban will discontinue aspirin. Pt takes lasix prn at home. Will need BMP early next week. OK for discharge.

## 2014-11-20 NOTE — Progress Notes (Signed)
Subjective:  Feeling better, no complaints.  Objective:  Vital Signs in the last 24 hours: Temp:  [97.7 F (36.5 C)-98.5 F (36.9 C)] 97.7 F (36.5 C) (12/16 0646) Pulse Rate:  [90-117] 104 (12/16 0646) Resp:  [15-20] 20 (12/16 0646) BP: (91-126)/(52-79) 91/52 mmHg (12/16 0646) SpO2:  [94 %-100 %] 100 % (12/16 0646) Weight:  [208 lb (94.348 kg)-211 lb 3.2 oz (95.8 kg)] 211 lb 3.2 oz (95.8 kg) (12/16 0646)  Intake/Output from previous day: 12/15 0701 - 12/16 0700 In: 123 [P.O.:120; I.V.:3] Out: 1975 [Urine:1975] Intake/Output from this shift:    Physical Exam: NECK: Without JVD, HJR, or bruit LUNGS: Clear anterior, posterior, lateral HEART: Iregular rate and rhythm, no murmur, gallop, rub, bruit, thrill, or heave EXTREMITIES: Without cyanosis, clubbing, or edema   Lab Results:  Recent Labs  11/19/14 0745 11/20/14 0608  WBC 9.5 6.2  HGB 13.0 11.6*  PLT 182 142*    Recent Labs  11/20/14 0027 11/20/14 0608  NA 134* 138  K 3.8 4.0  CL 93* 96  CO2 27 27  GLUCOSE 327* 296*  BUN 26* 28*  CREATININE 1.88* 2.02*    Recent Labs  11/19/14 1858 11/20/14 0027  TROPONINI <0.30 <0.30   Hepatic Function Panel  Recent Labs  11/20/14 0608  PROT 7.1  ALBUMIN 2.8*  AST 28  ALT 29  ALKPHOS 52  BILITOT 1.6*    Recent Labs  11/20/14 0608  CHOL 186   No results for input(s): PROTIME in the last 72 hours.  Imaging:   Cardiac Studies: 1. Echocardiogram 09/27/2014 Left ventricle: The cavity size was mildly dilated. Wall   thickness was increased in a pattern of moderate LVH. Diastolic   function is abnormal, indeterminant grade. Systolic function was   moderately to severely reduced. The estimated ejection fraction   was in the range of 30% to 35%. Evaluation of LVEF is limited by   limited visualization of the endocardium, consider contrast study   for more accurate evaluation. - Aorta: The visualized portion of the proximal ascending aorta is   mildly  dilated measuring 3.8 cm. - Aortic root: The aortic root was normal in size. - Left atrium: The atrium was moderately dilated. - Right ventricle: Not well visualized. Grossly appears moderately   enlarged with low normal systolic function. - Right atrium: Not well visualized. Grossly appears moderately   enlarged. - Pulmonary arteries: PA peak pressure: 35 mm Hg (S). PASP is   borderline elevated. - Inferior vena cava: The vessel was normal in size. The   respirophasic diameter changes were in the normal range (>= 50%),   consistent with normal central venous pressure. - Technically difficult study.  2. ICD (St Jude-Pacemaker Interrogation) 11/14/2014 CRT-D device check in office. Thresholds and sensing consistent with previous device measurements. Lead impedance trends stable over time. No mode switch episodes recorded. No ventricular arrhythmia episodes recorded. Patient bi-ventricularly pacing >44 % of the time. Device programmed with appropriate safety margins. Heart failure diagnostics reviewed and trends are stable for patient. A-fib with frequent PVC's, + eliquis.  Base rate to 70 today, ventricular pacing LV->RV 105msec. Audible/vibratory alerts demonstrated for patient. Estimated longevity 1.1 years.  Patient enrolled in remote follow up. Plan to check device remotely in 3 months and see in office in 6 months. Patient education completed including shock plan.  ROV in March with Dr. Lovena Le in [place].   Assessment/Plan:   1.Acute on Chronic Systolic CHF: Likely related to dietary non-compliance with salty  foods yesterday. He has diuresed another 25 with IV lasix.  Creatinine is elevated at 2.00. Takes lasix prn at home. Probably stable for discharge with early f/u with Dr. Harl Bowie and Dr. Andree Elk in [place].BMET next week.  2. Atrial fib:  bettercontrolled.Continue Eliquis    3. CAD: Hx of prior BMS to LAD in 2001, repeat BMS to LAD to 2009. Denies chest pain. Continue ASA.  4.  Diabetes: Blood glucose elevated. Will check HgB A1C.Patient hasn't taken insulin in 2 yrs. Needs to f/u with primary MD.  5. Acute on chronic renal insufficiency: Review of prior labs has him from 1.33 in June of 2015, to as high as 2.51 in October of 2015.   7. St Jude ICD-BiV pacemaker in situ: Last interrogated on 11/14/2014 with atrial fib present.     [name] 11/20/2014, 8:10 AM

## 2014-11-26 ENCOUNTER — Encounter: Payer: Self-pay | Admitting: Cardiovascular Disease

## 2014-12-08 IMAGING — CR DG CHEST 1V PORT
1 series · 1 of 1 positions shown · non-contrast
Comparison: 07/10/2013

CLINICAL DATA: Shortness of breath

PORTABLE CHEST - 1 VIEW

[portable]
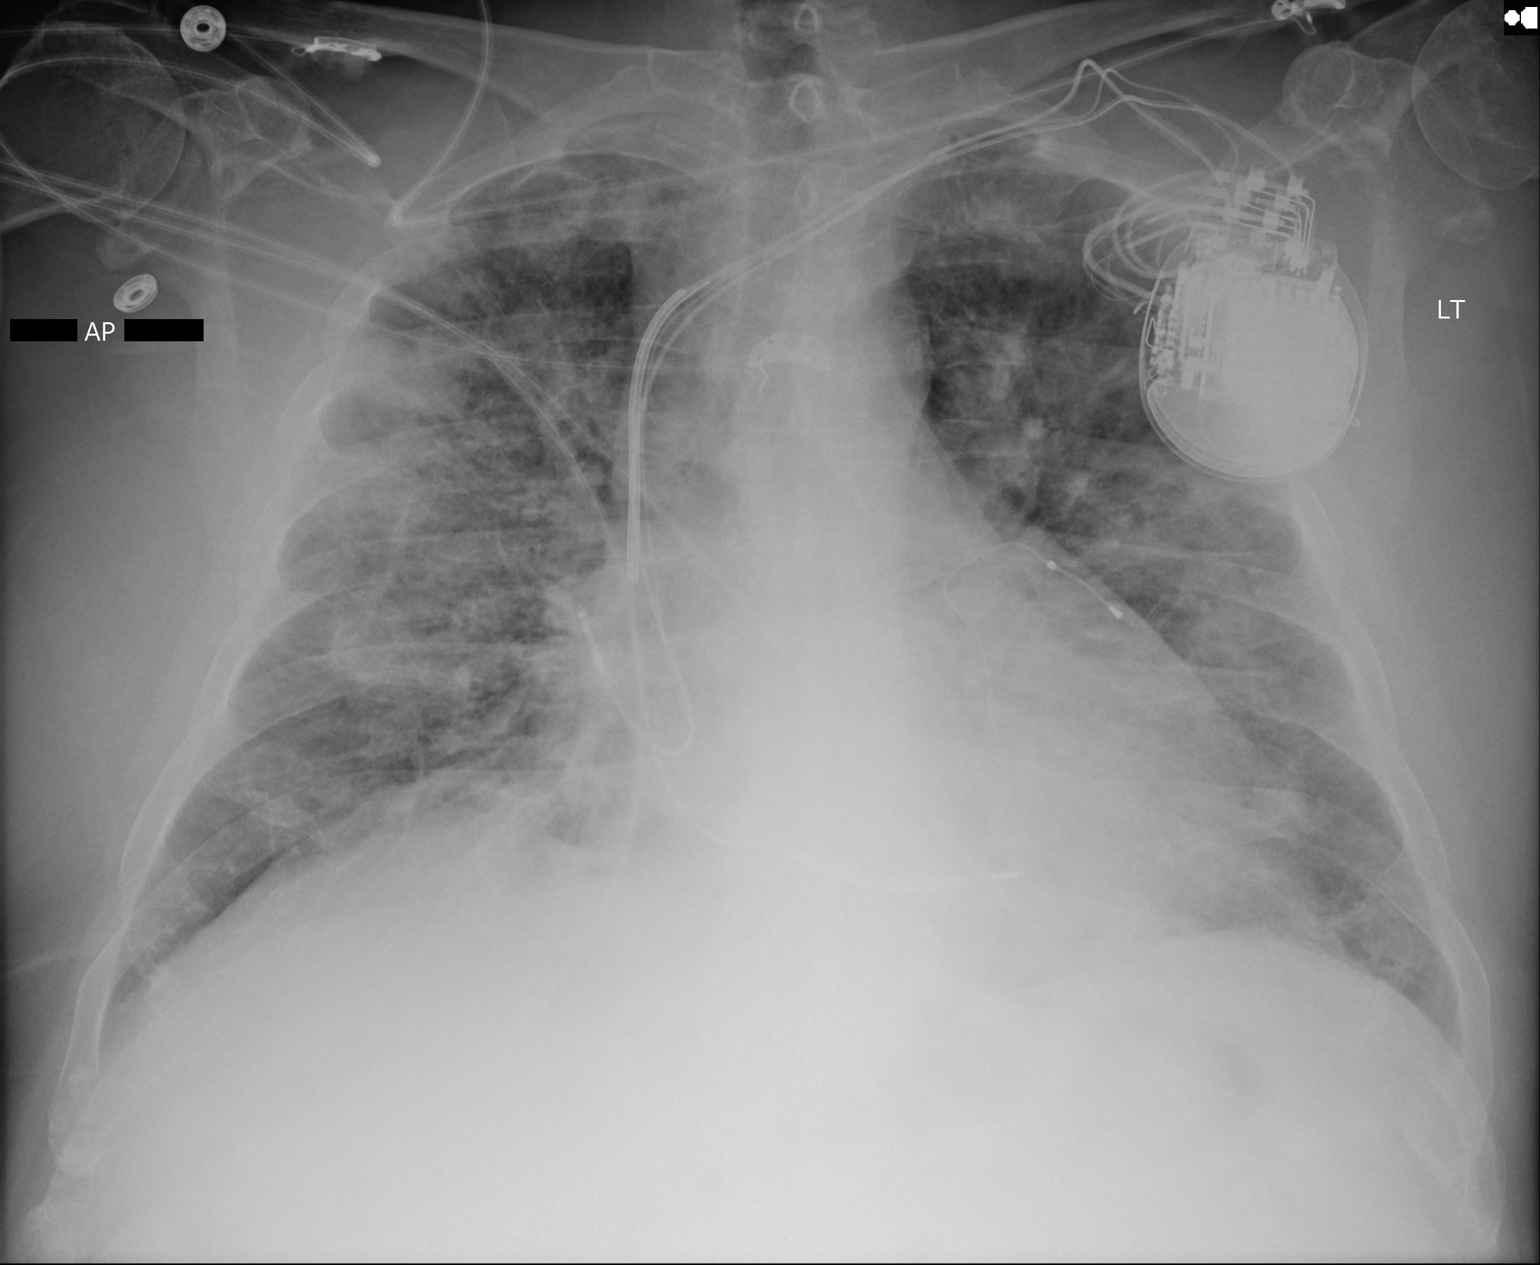

[1 of 1 positions shown; findings below may reference images not displayed]

FINDINGS: Left subclavian AICD stable.  New moderate predominately
perihilar interstitial and alveolar edema or infiltrates, with
patchy areas of more focal consolidation laterally in the right
upper lobe and in the left infrahilar region.  No definite
effusion.  Stable cardiomegaly.  Degenerative changes of bilateral
shoulders.
IMPRESSION: 1.  Worsening bilateral asymmetric edema or infiltrates.
2.  Stable cardiomegaly and postop change.

## 2014-12-09 IMAGING — CR DG CHEST 2V
2 series · 2 of 2 positions shown · non-contrast
Comparison: 07/29/2013 and earlier.

CLINICAL DATA: 56-year-old male with shortness of breath. Abnormal
chest x-ray.

EXAM:
CHEST  2 VIEW

[view not recorded (1 of 2)]
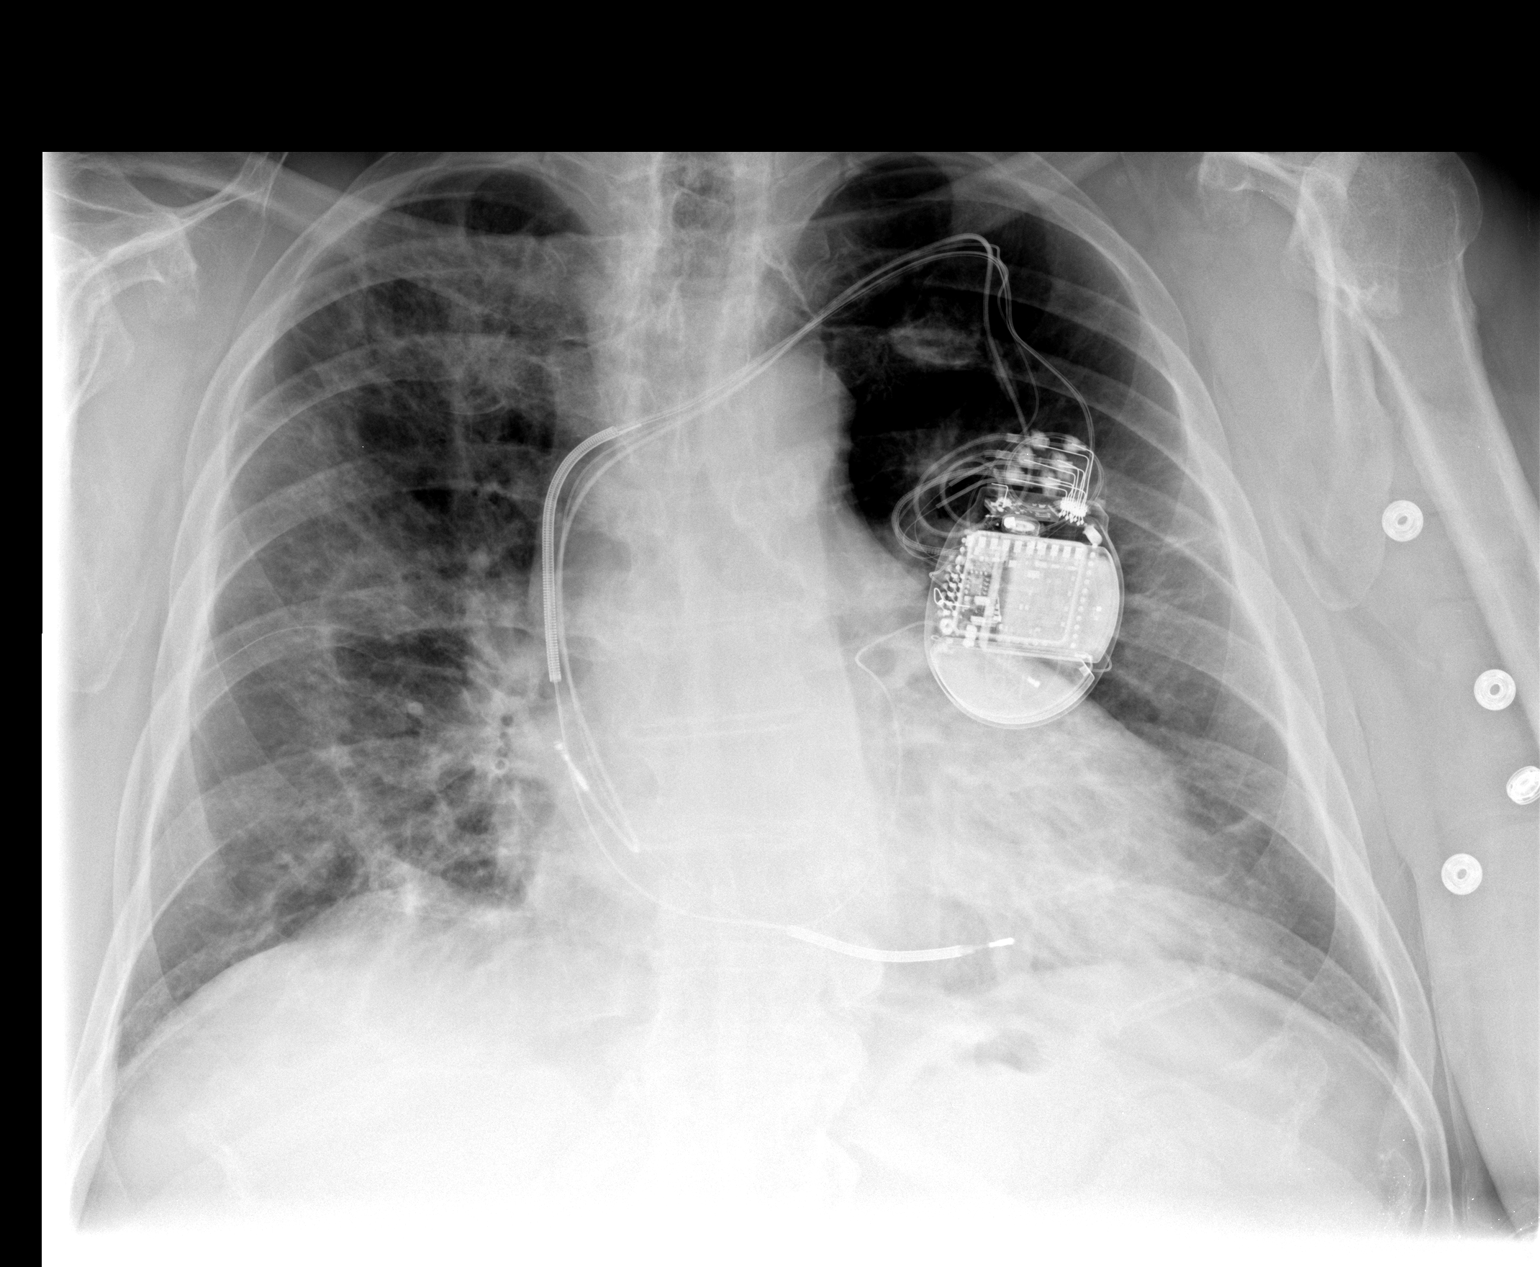

[view not recorded (2 of 2)]
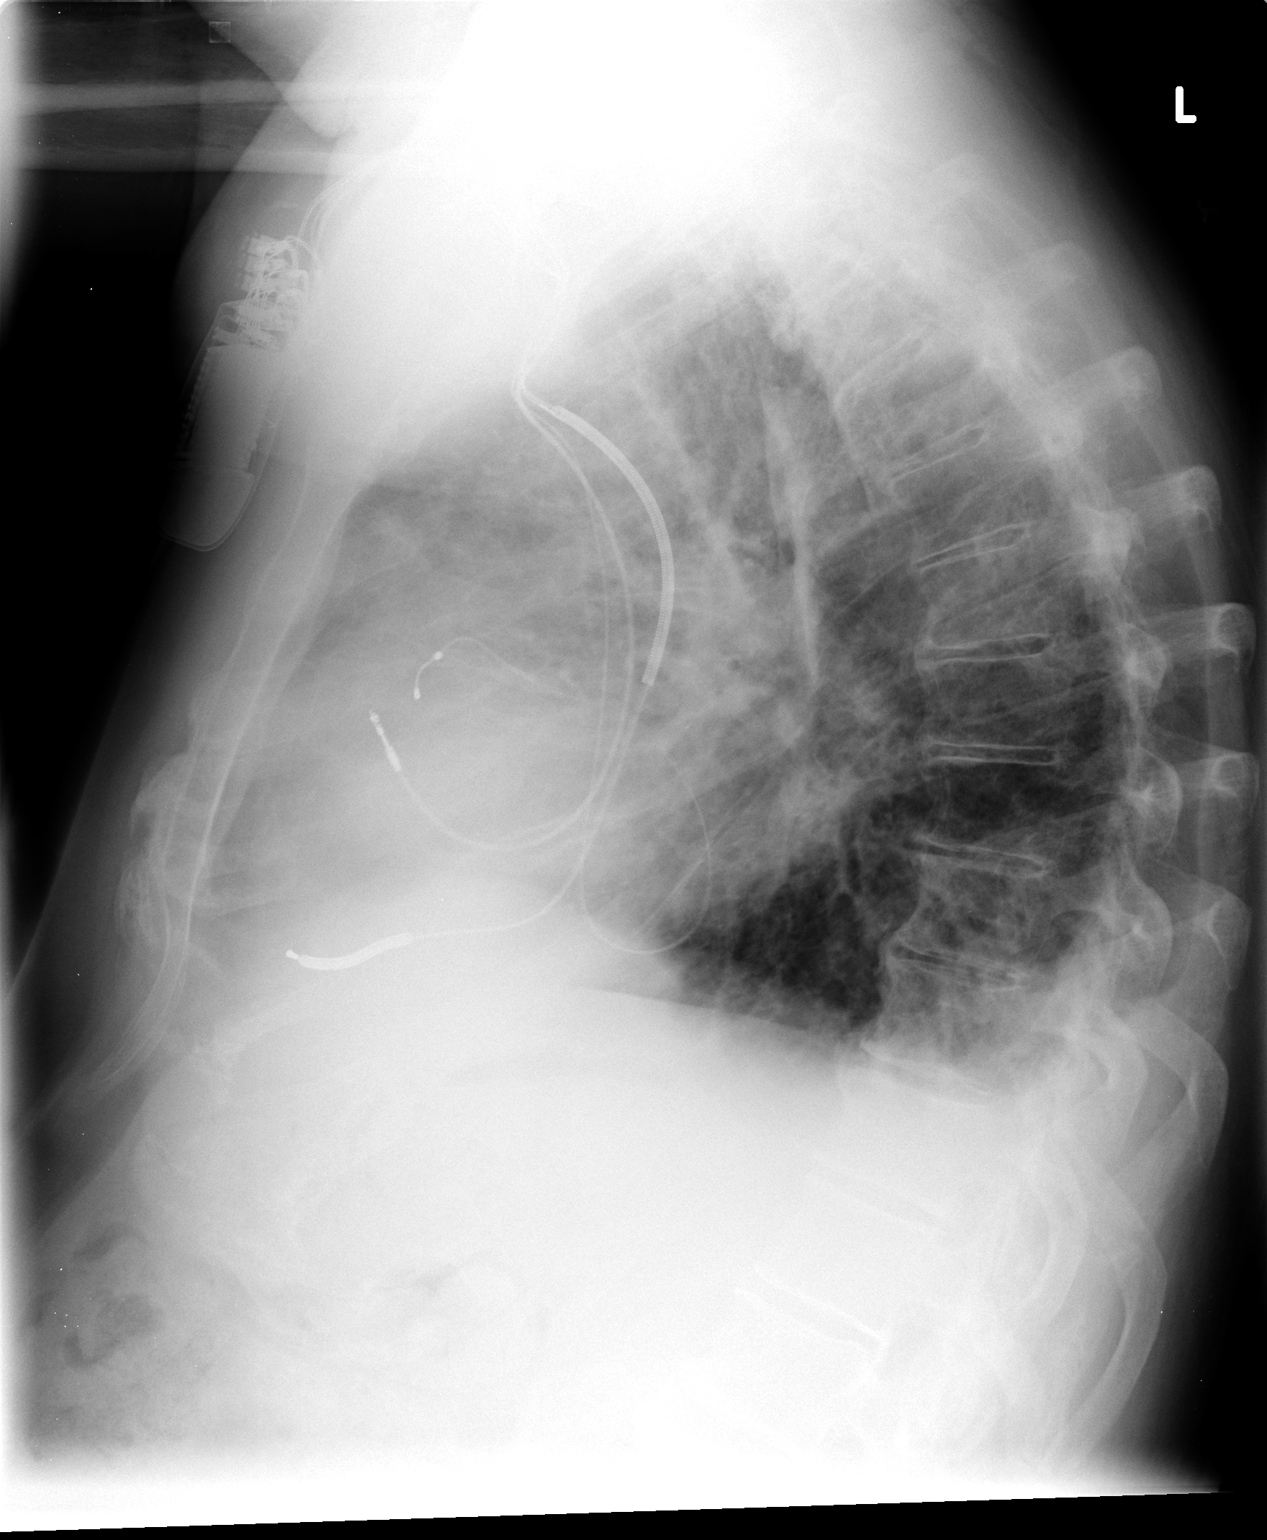

[2 of 2 positions shown; findings below may reference images not displayed]

FINDINGS: Stable lung volumes. Stable left chest cardiac AICD. Stable cardiac
size and mediastinal contours. Visualized tracheal air column is
within normal limits. No pneumothorax. Small bilateral pleural
effusions only visible on the lateral view but do appear increased
since 07/10/2013. Mild associated patchy bibasilar opacity,.
Associated increased coarse and streaky perihilar opacity in the
right lung. No pulmonary edema. Stable visualized osseous
structures.
IMPRESSION: 1. Trace/small pleural effusions with mild bibasilar atelectasis
suspected.

2. Superimposed increased patchy/ reticulonodular opacity in the
right lung, favor right lung infection.

## 2014-12-09 NOTE — Progress Notes (Signed)
HPI: [name] is a 59 year old male patient of [name] that we follow for ongoing assessment and management of coronary artery disease ischemic cardiomyopathy,  ICD in situ, most recent cardiac catheterization in 2014 revealing tight nondominant RCA lesion.  Medical management recommended.  Other history includes hypertension, atrial fib flutter on amiodarone and apixaban.He was last seen in the office in July of 2015, he was without complaints on that visit.  He also continues to see [name] in [place].    He was admitted in December of 2015 with complaints of worsening shortness of breath and was found to be in CHF.  Use diuresis with IV Lasix was 1.8 L diuresis.  He was started on spironolactone on discharge.  Echocardiogram revealed an EF of 30-35%.  His device was interrogated on 11/14/2014 demonstrating greater than 44% by the pacing with underlying atrial fibrillation.  He was continued on his anticoagulation therapy.   He is without complaints today. He has recently had refills on all of his cardiac medications by [name] in [place]. He is due for colonoscopy and EDG tomorrow.      No Known Allergies  Current Outpatient Prescriptions  Medication Sig Dispense Refill  . amiodarone (PACERONE) 200 MG tablet Take 1 tablet (200 mg total) by mouth daily. 30 tablet 0  . apixaban (ELIQUIS) 5 MG TABS tablet Take 5 mg by mouth 2 (two) times daily.    . citalopram (CELEXA) 20 MG tablet Take 1 tablet (20 mg total) by mouth daily. 30 tablet 0  . co-enzyme Q-10 50 MG capsule Take 50 mg by mouth every morning.     . digoxin (LANOXIN) 0.125 MG tablet Take 1 tablet (0.125 mg total) by mouth every other day. 30 tablet 6  . enalapril (VASOTEC) 2.5 MG tablet Take 1 tablet (2.5 mg total) by mouth daily. 30 tablet 1  . fluticasone (FLONASE) 50 MCG/ACT nasal spray Place 1 spray into both nostrils daily as needed for allergies.     Marland Kitchen insulin aspart (NOVOLOG FLEXPEN) 100 UNIT/ML FlexPen Inject 6  Units into the skin 3 (three) times daily with meals. 15 mL 11  . Insulin Glargine (LANTUS) 100 UNIT/ML Solostar Pen Inject 20 Units into the skin daily at 10 pm. 15 mL 11  . isosorbide dinitrate (ISORDIL) 30 MG tablet Take 15 mg by mouth 2 (two) times daily as needed (shortness of breath).     . meclizine (ANTIVERT) 25 MG tablet Take 25 mg by mouth as needed for dizziness.    . metoprolol succinate (TOPROL-XL) 100 MG 24 hr tablet Take 100 mg by mouth daily. Take with or immediately following a meal.    . metoprolol succinate (TOPROL-XL) 25 MG 24 hr tablet Take 1 tablet (25 mg total) by mouth 2 (two) times daily. 60 tablet 2  . niacin (NIASPAN) 1000 MG CR tablet Take 1,000 mg by mouth at bedtime.      . nitroGLYCERIN (NITROLINGUAL) 0.4 MG/SPRAY spray Place 1 spray under the tongue every 5 (five) minutes as needed. angina 12 g 3  . Omega-3 Fatty Acids (FISH OIL) 1200 MG CAPS Take 1,200 mg by mouth 2 (two) times daily.      . pantoprazole (PROTONIX) 40 MG tablet Take 1 tablet (40 mg total) by mouth daily. 30 tablet 6  . peg 3350 powder (MOVIPREP) 100 G SOLR Take 1 kit (200 g total) by mouth as directed. 1 kit 0  . ranolazine (RANEXA) 1000 MG SR tablet  Take 1 tablet (1,000 mg total) by mouth 2 (two) times daily. 60 tablet 9  . rosuvastatin (CRESTOR) 40 MG tablet Take 1 tablet (40 mg total) by mouth at bedtime. 90 tablet 3  . spironolactone (ALDACTONE) 25 MG tablet Take 12.5 mg by mouth daily.     No current facility-administered medications for this visit.    Past Medical History  Diagnosis Date  . Essential hypertension, benign   . Type 2 diabetes mellitus   . CAD (coronary artery disease)     a. BMS [facility] LAD 2001 [facility] b. PTCA/atherectomy ramus and BMS to LAD 2009  . Chronic systolic heart failure     a. 2D ECHO: 03/01/2014 EF 15-20%, severe, diffuse hypsokinesis. Mod LV dilation. Mod LVH, indeterminate diastolic dysfxn (poor study), high vent filling pressures, abnormal septal wall motion  and dyssynergy c/w RV pacing, mild MR, mod LA dilation, RV systolic function mod reduced, mild TR, PA pressure 42.   Marland Kitchen GERD (gastroesophageal reflux disease)   . Cardiomyopathy, ischemic     a. BIV ICD St. Jude, LVEF 23%  . Paroxysmal atrial fibrillation     a. on amiodarone, digoxin and Eliquis  . Adenocarcinoma of rectum     a. 2008  . Prostate cancer     a. s/p seed implants with chemo and radiation  . 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 St Jude  . TIA (transient ischemic attack)   . OSA on CPAP   . HLD (hyperlipidemia)   . Orthostatic hypotension   . Dizziness     a. chronic. Admission for this 07/18/2014  . History of falling July 2015    due to dizziness from medications  . CHF (congestive heart failure)   . Renal insufficiency     Past Surgical History  Procedure Laterality Date  . Internal defibrillator and pacemaker  2010    x2 St. Jude device  . Abdominal and perineal resection of rectum with total mesorectal excision  10/04/2007  . Colonoscopy      05/17/2007. IMPRESSION: Semilunar, apple-core neoplasm low in the rectum (palpable on digital rectal exam) beginning at 5 cm and corkscrewing up 5 cm in length. This was a low rectal lesion consistent with colorectal carcinoma. It was biosied multiple times. The upstream colon all the way to the cecum appeared normal. Recommendations: Followup on path. Surgical Consultation   . Colonoscopy  09/14/2011    Dr. Gala Romney: via colostomy, Single pedunculated benign inflammatory polyp. Due for surveillance Oct 2015  . Colostomy    . Colonoscopy N/A 07/02/2014    Procedure: COLONOSCOPY;  Surgeon: Daneil Dolin, MD;  Location: [facility];  Service: Endoscopy;  Laterality: N/A;  7:30 / COLONOSCOPY THRU COLOSTOMY  . Esophagogastroduodenoscopy N/A 07/02/2014    Procedure: ESOPHAGOGASTRODUODENOSCOPY (EGD);  Surgeon: Daneil Dolin, MD;  Location: [facility];  Service: Endoscopy;  Laterality: N/A;  7:30  .  Savory dilation N/A 07/02/2014    Procedure: SAVORY DILATION;  Surgeon: Daneil Dolin, MD;  Location: [facility];  Service: Endoscopy;  Laterality: N/A;  7:30  . Maloney dilation N/A 07/02/2014    Procedure: Venia Minks DILATION;  Surgeon: Daneil Dolin, MD;  Location: [facility];  Service: Endoscopy;  Laterality: N/A;  7:30  . Portacath placement  06/2007    "removed ~ 1 yr later"  . Cardiac catheterization  08/2001  . Coronary angioplasty with stent placement  2001; ~ 2006    "1 +  1"   . Left heart catheterization with coronary angiogram N/A 07/13/2013    Procedure: LEFT HEART CATHETERIZATION WITH CORONARY ANGIOGRAM;  Surgeon: Lorretta Harp, MD;  Location: [facility] CATH LAB;  Service: Cardiovascular;  Laterality: N/A;    ROS: Complete review of systems performed and found to be negative unless outlined above  PHYSICAL EXAM BP 108/72 mmHg  Pulse 63  Ht $R'5\' 10"'Wa$  (1.778 m)  Wt 214 lb (97.07 kg)  BMI 30.71 kg/m2  SpO2 95% General: Well developed, well nourished, in no acute distress Head: Eyes PERRLA, No xanthomas.   Normal cephalic and atramatic  Lungs: Clear bilaterally to auscultation and percussion. Heart: HRRR S1 S2, distant, without MRG.  Pulses are 2+ & equal.            No carotid bruit. No JVD.  No abdominal bruits. No femoral bruits. Abdomen: Bowel sounds are positive, abdomen soft and non-tender without masses or                  Hernia's noted. Msk:  Back normal, normal gait. Normal strength and tone for age. Extremities: No clubbing, cyanosis or edema.  DP diminished.  Neuro: Alert and oriented X 3. Psych:  Good affect, responds appropriately  ASSESSMENT AND PLAN

## 2014-12-10 ENCOUNTER — Ambulatory Visit (INDEPENDENT_AMBULATORY_CARE_PROVIDER_SITE_OTHER): Payer: Medicare Other | Admitting: Adult Health

## 2014-12-10 ENCOUNTER — Encounter: Payer: Self-pay | Admitting: Adult Health

## 2014-12-10 VITALS — BP 108/72 | HR 63 | Ht 70.0 in | Wt 214.0 lb

## 2014-12-10 DIAGNOSIS — I480 Paroxysmal atrial fibrillation: Secondary | ICD-10-CM | POA: Diagnosis not present

## 2014-12-10 DIAGNOSIS — I5023 Acute on chronic systolic (congestive) heart failure: Secondary | ICD-10-CM

## 2014-12-10 NOTE — Assessment & Plan Note (Signed)
He is doing well and is without complaints. He cannot tell when he has irregular HR. He is tolerating his medications and anticoagulant without bleeding or stomach upset. He is due for colonoscopy and EDG tomorrow and has held the Eliquis today. He will restart when procedure completed

## 2014-12-10 NOTE — Patient Instructions (Signed)
Follow up with Dr. Harl Bowie as discussed at your visit   Your physician recommends that you continue on your current medications as directed. Please refer to the Current Medication list given to you today.  Thank you for choosing [facility]!

## 2014-12-10 NOTE — Assessment & Plan Note (Signed)
He is without chest pain. He remains stable and asymptomatic. Continue current medication regimen. He is also being seen by Dr. Andree Elk at [facility]. He also has follow up appt with Dr. Harl Bowie on the 24th. I have suggested that he wait and reschedule for annual appt, but he wishes to see Dr. Harl Bowie on previously scheduled appt.

## 2014-12-10 NOTE — Assessment & Plan Note (Signed)
He has no evidence of fluid overload. He is watching salt. Labs are reviewed. No changes in medications.

## 2014-12-10 NOTE — Progress Notes (Deleted)
Name: [name]    DOB: 16-Jun-1956  Age: 59 y.o.  MR#: [id number]       PCP:  Arnoldo Lenis, MD      Insurance: Payor: MEDICARE / Plan: MEDICARE PART A AND B / Product Type: *No Product type* /   CC:    Chief Complaint  Patient presents with  . Coronary Artery Disease    Bare-metal stent to LAD, 2001, 2009  . Atrial Fibrillation  . Cardiomyopathy    Ischemic    VS Filed Vitals:   12/10/14 1312  BP: 108/72  Pulse: 63  Height: _0  (1.778 m)  Weight: 214 lb (97.07 kg)  SpO2: 95%    Weights Current Weight  12/10/14 214 lb (97.07 kg)  11/20/14 211 lb 3.2 oz (95.8 kg)  11/12/14 219 lb 6.4 oz (99.519 kg)    Blood Pressure  BP Readings from Last 3 Encounters:  12/10/14 108/72  11/20/14 98/59  11/12/14 124/58     Admit date:  (Not on file) Last encounter with RMR:  Visit date not found   Allergy Review of patient's allergies indicates no known allergies.  Current Outpatient Prescriptions  Medication Sig Dispense Refill  . amiodarone (PACERONE) 200 MG tablet Take 1 tablet (200 mg total) by mouth daily. 30 tablet 0  . apixaban (ELIQUIS) 5 MG TABS tablet Take 5 mg by mouth 2 (two) times daily.    . citalopram (CELEXA) 20 MG tablet Take 1 tablet (20 mg total) by mouth daily. 30 tablet 0  . co-enzyme Q-10 50 MG capsule Take 50 mg by mouth every morning.     . digoxin (LANOXIN) 0.125 MG tablet Take 1 tablet (0.125 mg total) by mouth every other day. 30 tablet 6  . enalapril (VASOTEC) 2.5 MG tablet Take 1 tablet (2.5 mg total) by mouth daily. 30 tablet 1  . fluticasone (FLONASE) 50 MCG/ACT nasal spray Place 1 spray into both nostrils daily as needed for allergies.     Marland Kitchen insulin aspart (NOVOLOG FLEXPEN) 100 UNIT/ML FlexPen Inject 6 Units into the skin 3 (three) times daily with meals. 15 mL 11  . Insulin Glargine (LANTUS) 100 UNIT/ML Solostar Pen Inject 20 Units into the skin daily at 10 pm. 15 mL 11  . isosorbide dinitrate (ISORDIL) 30 MG tablet Take 15 mg by mouth 2  (two) times daily as needed (shortness of breath).     . meclizine (ANTIVERT) 25 MG tablet Take 25 mg by mouth as needed for dizziness.    . metoprolol succinate (TOPROL-XL) 100 MG 24 hr tablet Take 100 mg by mouth daily. Take with or immediately following a meal.    . metoprolol succinate (TOPROL-XL) 25 MG 24 hr tablet Take 1 tablet (25 mg total) by mouth 2 (two) times daily. 60 tablet 2  . niacin (NIASPAN) 1000 MG CR tablet Take 1,000 mg by mouth at bedtime.      . nitroGLYCERIN (NITROLINGUAL) 0.4 MG/SPRAY spray Place 1 spray under the tongue every 5 (five) minutes as needed. angina 12 g 3  . Omega-3 Fatty Acids (FISH OIL) 1200 MG CAPS Take 1,200 mg by mouth 2 (two) times daily.      . pantoprazole (PROTONIX) 40 MG tablet Take 1 tablet (40 mg total) by mouth daily. 30 tablet 6  . peg 3350 powder (MOVIPREP) 100 G SOLR Take 1 kit (200 g total) by mouth as directed. 1 kit 0  . ranolazine (RANEXA) 1000 MG SR tablet Take 1 tablet (1,000  mg total) by mouth 2 (two) times daily. 60 tablet 9  . rosuvastatin (CRESTOR) 40 MG tablet Take 1 tablet (40 mg total) by mouth at bedtime. 90 tablet 3  . spironolactone (ALDACTONE) 25 MG tablet Take 12.5 mg by mouth daily.     No current facility-administered medications for this visit.    Discontinued Meds:   There are no discontinued medications.  Patient Active Problem List   Diagnosis Date Noted  . Acute on chronic systolic congestive heart failure   . Hyperlipidemia   . CKD (chronic kidney disease)   . Dietary noncompliance   . CHF (congestive heart failure) 11/19/2014  . CHF exacerbation 11/19/2014  . Congestive heart failure 09/27/2014  . Diabetes 09/27/2014  . Paroxysmal atrial fibrillation   . Orthostatic hypotension 07/19/2014  . CAD (coronary artery disease)   . OSA on CPAP   . GERD (gastroesophageal reflux disease) 06/05/2014  . V-tach 05/21/2014  . Gallbladder polyp 03/06/2014  . TIA (transient ischemic attack) 03/01/2014  . DM type 2,  uncontrolled, with renal complications 16/09/9603  . Acute-on-chronic renal failure 01/26/2014  . Dizziness 01/26/2014  . Obesity 12/12/2013  . Hypovolemia 12/12/2013  . Transaminitis 12/12/2013  . Hypotension 12/11/2013  .  CKD III 12/11/2013  . Acute viral syndrome 12/11/2013  . Atrial flutter 10/24/2013  . Atrial fibrillation with RVR 10/23/2013  . Chronic systolic heart failure 54/08/8118  . Acute respiratory failure with hypoxia 07/29/2013  . Acute on chronic clinical systolic heart failure 14/78/2956  . Hyperkalemia 07/10/2013  . Renal insufficiency- appears to be new 07/10/2013  . CAD with LCX disease and stents to LAD 07/10/2013  . PAF (paroxysmal atrial fibrillation) 03/23/2013  . Long term current use of anticoagulant therapy 03/23/2013  . ADENOCARCINOMA, RECTUM radiation/ chemo/ surg 2008 06/20/2008  . DIABETES MELLITUS 06/20/2008  . Essential hypertension 06/20/2008  . MYOCARDIAL INFARCTION 06/20/2008  . CARDIOMYOPATHY, ISCHEMIC, with BiV ICD, st Jude EF 23% 06/20/2008    LABS    Component Value Date/Time   NA 138 11/20/2014 0608   NA 134* 11/20/2014 0027   NA 137 11/19/2014 0745   K 4.0 11/20/2014 0608   K 3.8 11/20/2014 0027   K 4.5 11/19/2014 0745   CL 96 11/20/2014 0608   CL 93* 11/20/2014 0027   CL 96 11/19/2014 0745   CO2 27 11/20/2014 0608   CO2 27 11/20/2014 0027   CO2 22 11/19/2014 0745   GLUCOSE 296* 11/20/2014 0608   GLUCOSE 327* 11/20/2014 0027   GLUCOSE 385* 11/19/2014 0745   BUN 28* 11/20/2014 0608   BUN 26* 11/20/2014 0027   BUN 27* 11/19/2014 0745   CREATININE 2.02* 11/20/2014 0608   CREATININE 1.88* 11/20/2014 0027   CREATININE 2.00* 11/19/2014 0745   CREATININE 1.63* 01/10/2014 1059   CREATININE 1.33 08/02/2013 1109   CREATININE 1.72* 06/18/2013 1006   CALCIUM 9.5 11/20/2014 0608   CALCIUM 9.5 11/20/2014 0027   CALCIUM 10.3 11/19/2014 0745   GFRNONAA 34* 11/20/2014 0608   GFRNONAA 37* 11/20/2014 0027   GFRNONAA 35* 11/19/2014  0745   GFRAA 40* 11/20/2014 0608   GFRAA 43* 11/20/2014 0027   GFRAA 40* 11/19/2014 0745   CMP     Component Value Date/Time   NA 138 11/20/2014 0608   K 4.0 11/20/2014 0608   CL 96 11/20/2014 0608   CO2 27 11/20/2014 0608   GLUCOSE 296* 11/20/2014 0608   BUN 28* 11/20/2014 0608   CREATININE 2.02* 11/20/2014 0608   CREATININE 1.63*  01/10/2014 1059   CALCIUM 9.5 11/20/2014 0608   PROT 7.1 11/20/2014 0608   ALBUMIN 2.8* 11/20/2014 0608   AST 28 11/20/2014 0608   ALT 29 11/20/2014 0608   ALKPHOS 52 11/20/2014 0608   BILITOT 1.6* 11/20/2014 0608   GFRNONAA 34* 11/20/2014 0608   GFRAA 40* 11/20/2014 0608       Component Value Date/Time   WBC 6.2 11/20/2014 0608   WBC 9.5 11/19/2014 0745   WBC 6.8 09/28/2014 0607   WBC 9.3 05/02/2012 0852   WBC 5.0 11/09/2011 0838   WBC 5.2 05/11/2011 0932   HGB 11.6* 11/20/2014 0608   HGB 13.0 11/19/2014 0745   HGB 11.1* 09/28/2014 0607   HGB 14.6 05/02/2012 0852   HGB 13.3 11/09/2011 0838   HGB 13.5 05/11/2011 0932   HCT 35.0* 11/20/2014 0608   HCT 38.7* 11/19/2014 0745   HCT 32.5* 09/28/2014 0607   HCT 43.6 05/02/2012 0852   HCT 39.4 11/09/2011 0838   HCT 39.7 05/11/2011 0932   MCV 93.6 11/20/2014 0608   MCV 92.4 11/19/2014 0745   MCV 93.1 09/28/2014 0607   MCV 93.3 05/02/2012 0852   MCV 97.3 11/09/2011 0838   MCV 96.5 05/11/2011 0932    Lipid Panel     Component Value Date/Time   CHOL 186 11/20/2014 0608   TRIG 180* 11/20/2014 0608   HDL 27* 11/20/2014 0608   CHOLHDL 6.9 11/20/2014 0608   VLDL 36 11/20/2014 0608   LDLCALC 123* 11/20/2014 0608    ABG    Component Value Date/Time   PHART 7.440 10/04/2007 0915   PCO2ART 33.7* 10/04/2007 0915   PO2ART 359.0* 10/04/2007 0915   HCO3 23.1 10/04/2007 0915   TCO2 21 03/23/2010 1725   ACIDBASEDEF 1.0 10/04/2007 0915   O2SAT 100.0 10/04/2007 0915     Lab Results  Component Value Date   TSH 4.720* 11/19/2014   BNP (last 3 results)  Recent Labs  07/18/14 0555  09/27/14 0523 11/19/14 0745  PROBNP 1116.0* 9164.0* 5233.0*   Cardiac Panel (last 3 results) No results for input(s): CKTOTAL, CKMB, TROPONINI, RELINDX in the last 72 hours.  Iron/TIBC/Ferritin/ %Sat No results found for: IRON, TIBC, FERRITIN, IRONPCTSAT   EKG Orders placed or performed during the hospital encounter of 11/19/14  . EKG 12-Lead  . EKG 12-Lead  . EKG 12-Lead  . EKG 12-Lead  . EKG     Prior Assessment and Plan Problem List as of 12/10/2014      Cardiovascular and Mediastinum   Essential hypertension   Last Assessment & Plan   06/06/2014 Office Visit Edited 06/06/2014  3:21 PM by Lendon Colonel, NP    He is hypotensive today. His EF is 35% and would expect him to have low normal BP but not as low as 88/60. He complains of dizziness all the time, but especially with position changes. It is my suggestion that he decrease his lisinopril from 10 mg BID to once daily. Again, I am reluctant to change his medications as he is to see Dr. Andree Elk in 2 weeks.  If BP is lower or he is having syncope, he is to decrease the dose and report this to Dr. Andree Elk. I have discussed with him that ONE doctor needs to be in charge of medications as it will be confusing to go back and forth. He will be seen in one year unless symptomatic as he is seeing Dr. Andree Elk more regularly.     MYOCARDIAL INFARCTION   CARDIOMYOPATHY,  ISCHEMIC, with BiV ICD, st Jude EF 23%   Last Assessment & Plan   07/24/2014 Office Visit Written 07/24/2014  2:41 PM by Imogene Burn, PA-C    Patient is compensated without recurrent heart failure.    PAF (paroxysmal atrial fibrillation)   Last Assessment & Plan   06/06/2014 Office Visit Written 06/06/2014  3:12 PM by Lendon Colonel, NP    Heart rate is well controlled currently. He continues on amiodarone, metoprolol, and digoxin. He medications are provided and managed by Dr. Andree Elk in [facility]. I am reluctant to change medications or manipulate them as he has another  cardiology provider doing the same. We will see him in one year unless symptomatic.     CAD with LCX disease and stents to LAD   Acute on chronic clinical systolic heart failure   Last Assessment & Plan   06/06/2014 Office Visit Written 06/06/2014  3:18 PM by Lendon Colonel, NP    No evidence of fluid overload, no DOE or LE  He is not having significant DOE. Will continue low salt diet.     Chronic systolic heart failure   Last Assessment & Plan   02/11/2014 Office Visit Written 02/11/2014 11:03 AM by Evans Lance, MD    His heart failure symptoms are well compensated. He will continue his current meds. He will maintain a low sodium diet.     Atrial fibrillation with RVR   Last Assessment & Plan   07/24/2014 Office Visit Written 07/24/2014  2:41 PM by Imogene Burn, PA-C    In normal sinus rhythm    Atrial flutter   Last Assessment & Plan   02/11/2014 Office Visit Written 02/11/2014 11:00 AM by Evans Lance, MD    He is in atrial flutter today. Unclear how much has been flutter vs fib. He has been on amio 400 daily. I have asked the patient to reduce his dose to 200 mg daily. We discussed catheter ablation but he is totally asymptomatic. I have asked him to call if he develops increased sob.     Hypotension   TIA (transient ischemic attack)   V-tach   Last Assessment & Plan   07/24/2014 Office Visit Written 07/24/2014  2:41 PM by Imogene Burn, PA-C    Manuella Ghazi whether the patient had V. tach in the endoscopy suite. He does have an ICD that has not discharged. It was checked in June and he has another appointment for her device checked on 08/16/14. I will have him see Dr. branch in 2 weeks. Dr. Andree Elk in [place] on Monday.    Orthostatic hypotension   Last Assessment & Plan   07/24/2014 Office Visit Edited 07/24/2014  2:42 PM by Imogene Burn, PA-C    Patient had 2 falls on Monday. Most likely the second fall was due to orthostatic hypotension. Unfortunately the patient was taking  metoprolol 50 mg twice a day rather than 25 mg twice a day after he was in the emergency room. He was also taking spirometry on which was stopped. I asked him to stop spironolactione. He is now on metoprolol 25 mg twice a day. Followup with Dr. Andree Elk on Monday. Followup with Dr. Harl Bowie in 2 weeks.    CAD (coronary artery disease)   Congestive heart failure   Paroxysmal atrial fibrillation   CHF (congestive heart failure)   CHF exacerbation   Acute on chronic systolic congestive heart failure     Respiratory  Acute respiratory failure with hypoxia   OSA on CPAP     Digestive   ADENOCARCINOMA, RECTUM radiation/ chemo/ surg 2008   Last Assessment & Plan   11/12/2014 Office Visit Written 11/14/2014 10:28 PM by Orvil Feil, NP    Original diagnosis in 2008, s/p APR with last colonoscopy in 2012 with single pedunculated inflammatory polyp. Due for high risk surveillance now; both mother and sister with history of colon cancer as well. No lower GI symptoms.   Proceed with TCS (VIA COLOSTOMY) with Dr. Gala Romney in near future: the risks, benefits, and alternatives have been discussed with the patient in detail. The patient states understanding and desires to proceed. Has pacemaker/defibrillator HOLD ELIQUIS X 2 days prior to procedure and resume the day after per cardiology.     Gallbladder polyp   Last Assessment & Plan   08/06/2014 Office Visit Written 08/06/2014  2:53 PM by Orvil Feil, NP    On [place] in April 2015. Query hepatomegaly on exam. [place] of abdomen for gallbladder polyp surveillance now and assessment of liver.     GERD (gastroesophageal reflux disease)   Last Assessment & Plan   11/12/2014 Office Visit Written 11/14/2014 10:30 PM by Orvil Feil, NP    Chronic, continue Protonix daily. Incidental finding of esophageal wall thickening on CT in June 2015. Likely secondary to chronic GERD but unable to rule out Barrett's, occult malignancy.   Proceed with upper endoscopy in the near future with  Dr. Gala Romney. The risks, benefits, and alternatives have been discussed in detail with patient. They have stated understanding and desire to proceed.  HOLD ELIQUIS X 2 days prior, resume day after procedure      Endocrine   DIABETES MELLITUS   Last Assessment & Plan   08/17/2011 Office Visit Written 08/17/2011  1:42 PM by Andria Meuse, NP    Insulin and medication instructions given.    DM type 2, uncontrolled, with renal complications   Diabetes     Genitourinary   Renal insufficiency- appears to be new    CKD III   Acute-on-chronic renal failure   CKD (chronic kidney disease)     Other   Long term current use of anticoagulant therapy   Hyperkalemia   Acute viral syndrome   Obesity   Hypovolemia   Transaminitis   Last Assessment & Plan   06/05/2014 Office Visit Written 06/05/2014 10:59 AM by Orvil Feil, NP    Mild fluctuations in transaminases in setting of known fatty liver. Recheck in 6 weeks. Further work-up if persistent elevations.     Dizziness   Hyperlipidemia   Dietary noncompliance       Imaging: Dg Chest Portable 1 View  11/19/2014   CLINICAL DATA:  Shortness of breath.  EXAM: PORTABLE CHEST - 1 VIEW  COMPARISON:  09/27/2014  FINDINGS: Stable cardiac enlargement and appearance of a biventricular pacing/ICD device. Lungs show similar interstitial edema/mild CHF as the prior study. No significant pleural fluid is identified. No evidence of focal pulmonary consolidation or pneumothorax.  IMPRESSION: Interstitial edema/ mild CHF and stable cardiomegaly.   Electronically Signed   By: Aletta Edouard M.D.   On: 11/19/2014 08:04

## 2014-12-11 ENCOUNTER — Encounter (HOSPITAL_COMMUNITY)
Admission: RE | Disposition: A | Discharge status: Home / Self Care | Payer: Self-pay | Source: Ambulatory Visit | Attending: Internal Medicine

## 2014-12-11 ENCOUNTER — Ambulatory Visit (HOSPITAL_COMMUNITY)
Admission: RE | Admit: 2014-12-11 | Discharge: 2014-12-11 | Disposition: A | Discharge status: Home / Self Care | Payer: Medicare Other | Source: Ambulatory Visit | Attending: Internal Medicine | Admitting: Internal Medicine

## 2014-12-11 ENCOUNTER — Encounter (HOSPITAL_COMMUNITY): Payer: Self-pay

## 2014-12-11 DIAGNOSIS — I2510 Atherosclerotic heart disease of native coronary artery without angina pectoris: Secondary | ICD-10-CM | POA: Insufficient documentation

## 2014-12-11 DIAGNOSIS — I255 Ischemic cardiomyopathy: Secondary | ICD-10-CM | POA: Diagnosis not present

## 2014-12-11 DIAGNOSIS — I10 Essential (primary) hypertension: Secondary | ICD-10-CM | POA: Insufficient documentation

## 2014-12-11 DIAGNOSIS — K219 Gastro-esophageal reflux disease without esophagitis: Secondary | ICD-10-CM | POA: Insufficient documentation

## 2014-12-11 DIAGNOSIS — Z85048 Personal history of other malignant neoplasm of rectum, rectosigmoid junction, and anus: Secondary | ICD-10-CM | POA: Insufficient documentation

## 2014-12-11 DIAGNOSIS — Z95810 Presence of automatic (implantable) cardiac defibrillator: Secondary | ICD-10-CM | POA: Insufficient documentation

## 2014-12-11 DIAGNOSIS — I480 Paroxysmal atrial fibrillation: Secondary | ICD-10-CM | POA: Diagnosis not present

## 2014-12-11 DIAGNOSIS — E119 Type 2 diabetes mellitus without complications: Secondary | ICD-10-CM | POA: Insufficient documentation

## 2014-12-11 DIAGNOSIS — C189 Malignant neoplasm of colon, unspecified: Secondary | ICD-10-CM

## 2014-12-11 DIAGNOSIS — E785 Hyperlipidemia, unspecified: Secondary | ICD-10-CM | POA: Insufficient documentation

## 2014-12-11 DIAGNOSIS — Z800 Family history of malignant neoplasm of digestive organs: Secondary | ICD-10-CM | POA: Diagnosis not present

## 2014-12-11 DIAGNOSIS — G4733 Obstructive sleep apnea (adult) (pediatric): Secondary | ICD-10-CM | POA: Insufficient documentation

## 2014-12-11 DIAGNOSIS — Z1211 Encounter for screening for malignant neoplasm of colon: Secondary | ICD-10-CM | POA: Diagnosis not present

## 2014-12-11 DIAGNOSIS — R933 Abnormal findings on diagnostic imaging of other parts of digestive tract: Secondary | ICD-10-CM | POA: Insufficient documentation

## 2014-12-11 DIAGNOSIS — I509 Heart failure, unspecified: Secondary | ICD-10-CM | POA: Insufficient documentation

## 2014-12-11 DIAGNOSIS — Z8673 Personal history of transient ischemic attack (TIA), and cerebral infarction without residual deficits: Secondary | ICD-10-CM | POA: Insufficient documentation

## 2014-12-11 HISTORY — PX: COLONOSCOPY: SHX5424

## 2014-12-11 HISTORY — PX: ESOPHAGOGASTRODUODENOSCOPY: SHX5428

## 2014-12-11 LAB — GLUCOSE, CAPILLARY: Glucose-Capillary: 183 mg/dL — ABNORMAL HIGH (ref 70–99)

## 2014-12-11 SURGERY — COLONOSCOPY
Anesthesia: Moderate Sedation

## 2014-12-11 MED ORDER — STERILE WATER FOR IRRIGATION IR SOLN
Status: DC | PRN
  Administered 2014-12-11: 08:00:00

## 2014-12-11 MED ORDER — MIDAZOLAM HCL 5 MG/5ML IJ SOLN
INTRAMUSCULAR | Status: AC
  Filled 2014-12-11: qty 10

## 2014-12-11 MED ORDER — MEPERIDINE HCL 100 MG/ML IJ SOLN
INTRAMUSCULAR | Status: AC
  Filled 2014-12-11: qty 2

## 2014-12-11 MED ORDER — LIDOCAINE VISCOUS 2 % MT SOLN
OROMUCOSAL | Status: DC | PRN
  Administered 2014-12-11: 3 mL via OROMUCOSAL

## 2014-12-11 MED ORDER — ONDANSETRON HCL 4 MG/2ML IJ SOLN
INTRAMUSCULAR | Status: DC | PRN
  Administered 2014-12-11: 4 mg via INTRAVENOUS

## 2014-12-11 MED ORDER — ONDANSETRON HCL 4 MG/2ML IJ SOLN
INTRAMUSCULAR | Status: AC
  Filled 2014-12-11: qty 2

## 2014-12-11 MED ORDER — MIDAZOLAM HCL 5 MG/5ML IJ SOLN
INTRAMUSCULAR | Status: DC | PRN
  Administered 2014-12-11: 2 mg via INTRAVENOUS
  Administered 2014-12-11: 1 mg via INTRAVENOUS
  Administered 2014-12-11: 2 mg via INTRAVENOUS

## 2014-12-11 MED ORDER — MEPERIDINE HCL 100 MG/ML IJ SOLN
INTRAMUSCULAR | Status: DC | PRN
  Administered 2014-12-11: 50 mg via INTRAVENOUS
  Administered 2014-12-11: 25 mg via INTRAVENOUS

## 2014-12-11 MED ORDER — LIDOCAINE VISCOUS 2 % MT SOLN
OROMUCOSAL | Status: AC
  Filled 2014-12-11: qty 15

## 2014-12-11 MED ORDER — SODIUM CHLORIDE 0.9 % IV SOLN
INTRAVENOUS | Status: DC
  Administered 2014-12-11: 07:00:00 via INTRAVENOUS

## 2014-12-11 NOTE — Op Note (Signed)
[facility] [address] [place], [address]   COLONOSCOPY PROCEDURE REPORT  PATIENT: [name], [name]  MR#: [id number] BIRTHDATE: [birth year] , 58  yrs. old GENDER: male ENDOSCOPIST: R.  Garfield Cornea, MD FACP [facility] REFERRED RV:IFBPP Gerarda Fraction, M.D.  Arturo Morton, M.D.  Rozann Lesches, M.D. PROCEDURE DATE:  24-Dec-2014 PROCEDURE:   Colonoscopy via colostomy, surveillance INDICATIONS:Surveillance colonoscopy; history of colorectal cancer - status post APR. MEDICATIONS: Versed 5 mg IV and Demerol 75 mg IV in divided doses. Zofran 4 mg IV. ASA CLASS:       Class III  CONSENT: The risks, benefits, alternatives and imponderables including but not limited to bleeding, perforation as well as the possibility of a missed lesion have been reviewed.  The potential for biopsy, lesion removal, etc. have also been discussed. Questions have been answered.  All parties agreeable.  Please see the history and physical in the medical record for more information.  DESCRIPTION OF PROCEDURE:   After the risks benefits and alternatives of the procedure were thoroughly explained, informed consent was obtained.  Digital examination of colostomy drevealed no abnormalities .   The [id number] ([id number])  endoscope was introduced through colostomy and advanced to the cecum, which was identified by both the appendix and ileocecal valve. No adverse events experienced.   The quality of the prep was adequate.  The residual weakness.The instrument was then slowly withdrawn as the colon was fully examined.      COLON FINDINGS: Normal appearing residual colonic mucosa from colostomy to cecum.        .  Withdrawal time=7 minutes 0 seconds.  The scope was withdrawn and the procedure completed. COMPLICATIONS: There were no immediate complications.  ENDOSCOPIC IMPRESSION: Normal residual colonic mucosa  RECOMMENDATIONS: Repeat surveillance colonoscopy in 5 years. See EGD report.  Resume Eliquis today     eSigned:  R. Garfield Cornea, MD FACP [facility] December 24, 2014 8:30 AM   cc:  CPT CODES: ICD CODES:  The ICD and CPT codes recommended by this software are interpretations from the data that the clinical staff has captured with the software.  The verification of the translation of this report to the ICD and CPT codes and modifiers is the sole responsibility of the health care institution and practicing physician where this report was generated.  [place]. will not be held responsible for the validity of the ICD and CPT codes included on this report.  AMA assumes no liability for data contained or not contained herein. CPT is a Designer, television/film set of the [employer].  PATIENT NAME:  [name], [name] MR#: [id number]

## 2014-12-11 NOTE — Op Note (Signed)
[facility] [address] [place], [address]   ENDOSCOPY PROCEDURE REPORT  PATIENT: [name], [name]  MR#: [id number] BIRTHDATE: [birth year] , 58  yrs. old GENDER: male ENDOSCOPIST: R.  Garfield Cornea, MD FACP [facility]ACG REFERRED BY:  Arturo Morton, M.D.  Kerin Perna, M.D.  Rozann Lesches, M.D. PROCEDURE DATE:  12-16-2014 PROCEDURE:  EGD, diagnostic INDICATIONS:  thickened esophagus on CT; GERD. MEDICATIONS: Versed 4 mg IV and Demerol 75 mg IV in divided doses. Xylocaine gel.  Zofran 4 mg IV. ASA CLASS:      Class III  CONSENT: The risks, benefits, limitations, alternatives and imponderables have been discussed.  The potential for biopsy, esophogeal dilation, etc. have also been reviewed.  Questions have been answered.  All parties agreeable.  Please see the history and physical in the medical record for more information.  DESCRIPTION OF PROCEDURE: After the risks benefits and alternatives of the procedure were thoroughly explained, informed consent was obtained.  The [id number] ([id number]) endoscope was introduced through the mouth and advanced to the second portion of the duodenum , limited by Without limitations. The instrument was slowly withdrawn as the mucosa was fully examined.    Entirely normal appearing esophageal mucosa.  Esophagus patent throughout its course.  Stomach empty.  Normal gastric mucosa. Patent pylorus.  Normal first and second portion of the duodenum. Retroflexed views revealed no abnormalities.     The scope was then withdrawn from the patient and the procedure completed.  COMPLICATIONS: There were no immediate complications.  ENDOSCOPIC IMPRESSION: Normal EGD  RECOMMENDATIONS: Continue Protonix daily for reflux. See colonoscopy report.  REPEAT EXAM:  eSigned:  R. Garfield Cornea, MD Rosalita Chessman [facility] 2014-12-16 8:04 AM    CC:  CPT CODES: ICD CODES:  The ICD and CPT codes recommended by this software are interpretations from the data  that the clinical staff has captured with the software.  The verification of the translation of this report to the ICD and CPT codes and modifiers is the sole responsibility of the health care institution and practicing physician where this report was generated.  [place]. will not be held responsible for the validity of the ICD and CPT codes included on this report.  AMA assumes no liability for data contained or not contained herein. CPT is a Designer, television/film set of the [employer].  PATIENT NAME:  [name], [name] MR#: [id number]

## 2014-12-11 NOTE — H&P (View-Only) (Signed)
Referring Provider: Redmond School, MD Primary Care Physician:  Arnoldo Lenis, MD  Primary GI: Dr. Gala Romney   Chief Complaint  Patient presents with  . Follow-up    HPI:   [name] presents today to reschedule colonoscopy and EGD. Past history significant for stage III rectal carcinoma, diagnosed in 2008, s/p APR/chemotherapy radiation therapy. Last colonoscopy in 2012 with single pedunculated benign inflammatory polyp. Due for surveillance this year. Presents today to discuss EGD to rule out occult process due to esophageal thickening incidentally noted on recent CT June 2015. At time of appointment in July for procedure, was direct admit due to afib, orthostasis. Last seen by Dr. Harl Bowie 8/28. On Eliquis.   No dysphagia, abdominal pain, N/V, melena, hematochezia. Called into our office in October with RUQ pain. US abdomen with likely gallstone, gallbladder contracted but unable to exclude gallbladder wall thickening. HIDA with EF 57%, no symptoms with Ensure ingestion. No further abdominal pain.    Past Medical History  Diagnosis Date  . Essential hypertension, benign   . Type 2 diabetes mellitus   . CAD (coronary artery disease)     a. BMS to LAD 2001 at [facility] b. PTCA/atherectomy ramus and BMS to LAD 2009  . Chronic systolic heart failure     a. 2D ECHO: 03/01/2014 EF 15-20%, severe, diffuse hypsokinesis. Mod LV dilation. Mod LVH, indeterminate diastolic dysfxn (poor study), high vent filling pressures, abnormal septal wall motion and dyssynergy c/w RV pacing, mild MR, mod LA dilation, RV systolic function mod reduced, mild TR, PA pressure 42.   Marland Kitchen GERD (gastroesophageal reflux disease)   . Cardiomyopathy, ischemic     a. BIV ICD St. Jude, LVEF 23%  . Paroxysmal atrial fibrillation     a. on amiodarone, digoxin and Eliquis  . Adenocarcinoma of rectum     a. 2008  . Prostate cancer     a. s/p seed implants with chemo and radiation  . 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 St Jude  . TIA (transient ischemic attack)   . OSA on CPAP   . HLD (hyperlipidemia)   . Orthostatic hypotension   . Dizziness     a. chronic. Admission for this 07/18/2014  . History of falling July 2015    due to dizziness from medications  . CHF (congestive heart failure)   . Renal insufficiency     Past Surgical History  Procedure Laterality Date  . Internal defibrillator and pacemaker  2010    x2 St. Jude device  . Abdominal and perineal resection of rectum with total mesorectal excision  10/04/2007  . Colonoscopy      05/17/2007. IMPRESSION: Semilunar, apple-core neoplasm low in the rectum (palpable on digital rectal exam) beginning at 5 cm and corkscrewing up 5 cm in length. This was a low rectal lesion consistent with colorectal carcinoma. It was biosied multiple times. The upstream colon all the way to the cecum appeared normal. Recommendations: Followup on path. Surgical Consultation   . Colonoscopy  09/14/2011    Dr. Gala Romney: via colostomy, Single pedunculated benign inflammatory polyp. Due for surveillance Oct 2015  . Colostomy    . Colonoscopy N/A 07/02/2014    Procedure: COLONOSCOPY;  Surgeon: Daneil Dolin, MD;  Location: [facility];  Service: Endoscopy;  Laterality: N/A;  7:30 / COLONOSCOPY THRU COLOSTOMY  . Esophagogastroduodenoscopy N/A 07/02/2014    Procedure: ESOPHAGOGASTRODUODENOSCOPY (EGD);  Surgeon: Daneil Dolin, MD;  Location: [facility];  Service: Endoscopy;  Laterality: N/A;  7:30  . Savory dilation N/A 07/02/2014    Procedure: SAVORY DILATION;  Surgeon: Daneil Dolin, MD;  Location: [facility];  Service: Endoscopy;  Laterality: N/A;  7:30  . Maloney dilation N/A 07/02/2014    Procedure: Venia Minks DILATION;  Surgeon: Daneil Dolin, MD;  Location: [facility];  Service: Endoscopy;  Laterality: N/A;  7:30  . Portacath placement  06/2007    "removed ~ 1 yr later"  . Cardiac catheterization  08/2001  . Coronary  angioplasty with stent placement  2001; ~ 2006    "1 + 1"     Current Outpatient Prescriptions  Medication Sig Dispense Refill  . amiodarone (PACERONE) 200 MG tablet Take 1 tablet (200 mg total) by mouth daily. 30 tablet 0  . apixaban (ELIQUIS) 5 MG TABS tablet Take 5 mg by mouth 2 (two) times daily.    Marland Kitchen aspirin 81 MG EC tablet Take 81 mg by mouth at bedtime.     . citalopram (CELEXA) 20 MG tablet Take 1 tablet (20 mg total) by mouth daily. 30 tablet 0  . co-enzyme Q-10 50 MG capsule Take 50 mg by mouth every morning.     . digoxin (LANOXIN) 0.125 MG tablet Take 1 tablet (0.125 mg total) by mouth every other day. 30 tablet 6  . enalapril (VASOTEC) 2.5 MG tablet Take 1 tablet (2.5 mg total) by mouth daily. 30 tablet 1  . fluticasone (FLONASE) 50 MCG/ACT nasal spray Place 1 spray into both nostrils daily as needed for allergies.     . isosorbide dinitrate (ISORDIL) 30 MG tablet Take 15 mg by mouth 2 (two) times daily.     . meclizine (ANTIVERT) 25 MG tablet Take 25 mg by mouth as needed for dizziness.    . metFORMIN (GLUCOPHAGE) 500 MG tablet Take 500 mg by mouth 2 (two) times daily with a meal.    . metoprolol succinate (TOPROL-XL) 25 MG 24 hr tablet Take 1 tablet (25 mg total) by mouth 2 (two) times daily. 60 tablet 2  . niacin (NIASPAN) 1000 MG CR tablet Take 1,000 mg by mouth at bedtime.      . nitroGLYCERIN (NITROLINGUAL) 0.4 MG/SPRAY spray Place 1 spray under the tongue every 5 (five) minutes as needed. angina 12 g 3  . Omega-3 Fatty Acids (FISH OIL) 1200 MG CAPS Take 1,200 mg by mouth 2 (two) times daily.      . pantoprazole (PROTONIX) 40 MG tablet Take 1 tablet (40 mg total) by mouth daily. 30 tablet 6  . ranolazine (RANEXA) 1000 MG SR tablet Take 1 tablet (1,000 mg total) by mouth 2 (two) times daily. 60 tablet 9  . rosuvastatin (CRESTOR) 40 MG tablet Take 1 tablet (40 mg total) by mouth at bedtime. 90 tablet 3  . spironolactone (ALDACTONE) 25 MG tablet Take 12.5 mg by mouth daily.       No current facility-administered medications for this visit.    Allergies as of 11/12/2014  . (No Known Allergies)    Family History  Problem Relation Age of Onset  . Colon cancer Mother 87  . Colon cancer Sister 61  . Coronary artery disease Father   . Colon cancer Other     2 cousins, succumbed to illness    History   Social History  . Marital Status: Married    Spouse Name: N/A    Number of Children: 1  . Years of Education: N/A  Occupational History  .     Social History Main Topics  . Smoking status: Never Smoker   . Smokeless tobacco: Never Used  . Alcohol Use: No     Comment: Former user 45 years ago  . Drug Use: No  . Sexual Activity: No   Other Topics Concern  . None   Social History Narrative    Review of Systems: As mentioned in HPI.   Physical Exam: BP 124/58 mmHg  Pulse 45  Temp(Src) 97 F (36.1 C)  Ht 5\' 10"  (1.778 m)  Wt 219 lb 6.4 oz (99.519 kg)  BMI 31.48 kg/m2 General:   Alert and oriented. No distress noted. Pleasant and cooperative.  Head:  Normocephalic and atraumatic. Eyes:  Conjuctiva clear without scleral icterus. Mouth:  Oral mucosa pink and moist. Good dentition. No lesions Heart:  S1, S2 present without murmurs, rubs, or gallops. Regular rate and rhythm. Abdomen:  +BS, soft, non-tender and non-distended. No rebound or guarding. No HSM or masses noted. LLQ colostomy intact.  Msk:  Symmetrical without gross deformities. Normal posture. Extremities:  Without edema. Neurologic:  Alert and  oriented x4;  grossly normal neurologically. Skin:  Intact without significant lesions or rashes. Psych:  Alert and cooperative. Normal mood and affect.

## 2014-12-11 NOTE — Interval H&P Note (Signed)
History and Physical Interval Note:  12/11/2014 7:35 AM  Chancy Hurter  has presented today for surgery, with the diagnosis of colon cancer/GERD  The various methods of treatment have been discussed with the patient and family. After consideration of risks, benefits and other options for treatment, the patient has consented to  Procedure(s) with comments: COLONOSCOPY VIA COLOSTOMY (N/A) - 730  ESOPHAGOGASTRODUODENOSCOPY (EGD) (N/A) as a surgical intervention .  The patient's history has been reviewed, patient examined, no change in status, stable for surgery.  I have reviewed the patient's chart and labs.  Questions were answered to the patient's satisfaction.     Jericca Russett  EGD and colonoscopy via colostomy today per plan.The risks, benefits, limitations, imponderables and alternatives regarding both EGD and colonoscopy have been reviewed with the patient. Questions have been answered. All parties agreeable.

## 2014-12-11 NOTE — Discharge Instructions (Addendum)
Colonoscopy Discharge Instructions  Read the instructions outlined below and refer to this sheet in the next few weeks. These discharge instructions provide you with general information on caring for yourself after you leave the hospital. Your doctor may also give you specific instructions. While your treatment has been planned according to the most current medical practices available, unavoidable complications occasionally occur. If you have any problems or questions after discharge, call Dr. Gala Romney at [phone]. ACTIVITY  You may resume your regular activity, but move at a slower pace for the next 24 hours.   Take frequent rest periods for the next 24 hours.   Walking will help get rid of the air and reduce the bloated feeling in your belly (abdomen).   No driving for 24 hours (because of the medicine (anesthesia) used during the test).    Do not sign any important legal documents or operate any machinery for 24 hours (because of the anesthesia used during the test).  NUTRITION  Drink plenty of fluids.   You may resume your normal diet as instructed by your doctor.   Begin with a light meal and progress to your normal diet. Heavy or fried foods are harder to digest and may make you feel sick to your stomach (nauseated).   Avoid alcoholic beverages for 24 hours or as instructed.  MEDICATIONS  You may resume your normal medications unless your doctor tells you otherwise.  WHAT YOU CAN EXPECT TODAY  Some feelings of bloating in the abdomen.   Passage of more gas than usual.   Spotting of blood in your stool or on the toilet paper.  IF YOU HAD POLYPS REMOVED DURING THE COLONOSCOPY:  No aspirin products for 7 days or as instructed.   No alcohol for 7 days or as instructed.   Eat a soft diet for the next 24 hours.  FINDING OUT THE RESULTS OF YOUR TEST Not all test results are available during your visit. If your test results are not back during the visit, make an appointment  with your caregiver to find out the results. Do not assume everything is normal if you have not heard from your caregiver or the medical facility. It is important for you to follow up on all of your test results.  SEEK IMMEDIATE MEDICAL ATTENTION IF:  You have more than a spotting of blood in your stool.   Your belly is swollen (abdominal distention).   You are nauseated or vomiting.   You have a temperature over 101.  You have abdominal pain or discomfort that is severe or gets worse throughout the day. EGD Discharge instructions Please read the instructions outlined below and refer to this sheet in the next few weeks. These discharge instructions provide you with general information on caring for yourself after you leave the hospital. Your doctor may also give you specific instructions. While your treatment has been planned according to the most current medical practices available, unavoidable complications occasionally occur. If you have any problems or questions after discharge, please call your doctor. ACTIVITY You may resume your regular activity but move at a slower pace for the next 24 hours.  Take frequent rest periods for the next 24 hours.  Walking will help expel (get rid of) the air and reduce the bloated feeling in your abdomen.  No driving for 24 hours (because of the anesthesia (medicine) used during the test).  You may shower.  Do not sign any important legal documents or operate any machinery for 24  hours (because of the anesthesia used during the test).  NUTRITION Drink plenty of fluids.  You may resume your normal diet.  Begin with a light meal and progress to your normal diet.  Avoid alcoholic beverages for 24 hours or as instructed by your caregiver.  MEDICATIONS You may resume your normal medications unless your caregiver tells you otherwise.  WHAT YOU CAN EXPECT TODAY You may experience abdominal discomfort such as a feeling of fullness or gas pains.   FOLLOW-UP Your doctor will discuss the results of your test with you.  SEEK IMMEDIATE MEDICAL ATTENTION IF ANY OF THE FOLLOWING OCCUR: Excessive nausea (feeling sick to your stomach) and/or vomiting.  Severe abdominal pain and distention (swelling).  Trouble swallowing.  Temperature over 101 F (37.8 C).  Rectal bleeding or vomiting of blood.    GERD information provided  Continue Protonix daily  Resume Eliquis today  Repeat colonoscopy 5 years   Gastroesophageal Reflux Disease, Adult Gastroesophageal reflux disease (GERD) happens when acid from your stomach flows up into the esophagus. When acid comes in contact with the esophagus, the acid causes soreness (inflammation) in the esophagus. Over time, GERD may create small holes (ulcers) in the lining of the esophagus. CAUSES  Increased body weight. This puts pressure on the stomach, making acid rise from the stomach into the esophagus. Smoking. This increases acid production in the stomach. Drinking alcohol. This causes decreased pressure in the lower esophageal sphincter (valve or ring of muscle between the esophagus and stomach), allowing acid from the stomach into the esophagus. Late evening meals and a full stomach. This increases pressure and acid production in the stomach. A malformed lower esophageal sphincter. Sometimes, no cause is found. SYMPTOMS  Burning pain in the lower part of the mid-chest behind the breastbone and in the mid-stomach area. This may occur twice a week or more often. Trouble swallowing. Sore throat. Dry cough. Asthma-like symptoms including chest tightness, shortness of breath, or wheezing. DIAGNOSIS  Your caregiver may be able to diagnose GERD based on your symptoms. In some cases, X-rays and other tests may be done to check for complications or to check the condition of your stomach and esophagus. TREATMENT  Your caregiver may recommend over-the-counter or prescription medicines to help decrease  acid production. Ask your caregiver before starting or adding any new medicines.  HOME CARE INSTRUCTIONS  Change the factors that you can control. Ask your caregiver for guidance concerning weight loss, quitting smoking, and alcohol consumption. Avoid foods and drinks that make your symptoms worse, such as: Caffeine or alcoholic drinks. Chocolate. Peppermint or mint flavorings. Garlic and onions. Spicy foods. Citrus fruits, such as oranges, lemons, or limes. Tomato-based foods such as sauce, chili, salsa, and pizza. Fried and fatty foods. Avoid lying down for the 3 hours prior to your bedtime or prior to taking a nap. Eat small, frequent meals instead of large meals. Wear loose-fitting clothing. Do not wear anything tight around your waist that causes pressure on your stomach. Raise the head of your bed 6 to 8 inches with wood blocks to help you sleep. Extra pillows will not help. Only take over-the-counter or prescription medicines for pain, discomfort, or fever as directed by your caregiver. Do not take aspirin, ibuprofen, or other nonsteroidal anti-inflammatory drugs (NSAIDs). SEEK IMMEDIATE MEDICAL CARE IF:  You have pain in your arms, neck, jaw, teeth, or back. Your pain increases or changes in intensity or duration. You develop nausea, vomiting, or sweating (diaphoresis). You develop shortness of breath,  or you faint. Your vomit is green, yellow, black, or looks like coffee grounds or blood. Your stool is red, bloody, or black. These symptoms could be signs of other problems, such as heart disease, gastric bleeding, or esophageal bleeding. MAKE SURE YOU:  Understand these instructions. Will watch your condition. Will get help right away if you are not doing well or get worse. Document Released: 09/01/2005 Document Revised: 02/14/2012 Document Reviewed: 06/11/2011 [facility] Patient Information 2015 [place], [place]. This information is not intended to replace advice given to you by  your health care provider. Make sure you discuss any questions you have with your health care provider.

## 2014-12-12 ENCOUNTER — Encounter (HOSPITAL_COMMUNITY): Payer: Self-pay | Admitting: Internal Medicine

## 2014-12-19 ENCOUNTER — Encounter: Payer: Self-pay | Admitting: Cardiology

## 2014-12-19 ENCOUNTER — Ambulatory Visit (INDEPENDENT_AMBULATORY_CARE_PROVIDER_SITE_OTHER): Payer: Medicare Other | Admitting: Cardiology

## 2014-12-19 VITALS — BP 112/75 | HR 93 | Ht 70.0 in | Wt 211.0 lb

## 2014-12-19 DIAGNOSIS — M79606 Pain in leg, unspecified: Secondary | ICD-10-CM

## 2014-12-19 DIAGNOSIS — E785 Hyperlipidemia, unspecified: Secondary | ICD-10-CM | POA: Diagnosis not present

## 2014-12-19 DIAGNOSIS — I5022 Chronic systolic (congestive) heart failure: Secondary | ICD-10-CM

## 2014-12-19 DIAGNOSIS — I2510 Atherosclerotic heart disease of native coronary artery without angina pectoris: Secondary | ICD-10-CM | POA: Diagnosis not present

## 2014-12-19 DIAGNOSIS — I480 Paroxysmal atrial fibrillation: Secondary | ICD-10-CM

## 2014-12-19 MED ORDER — FUROSEMIDE 20 MG PO TABS: 20.0000 mg | ORAL_TABLET | Freq: Every day | ORAL | Status: DC | PRN

## 2014-12-19 NOTE — Patient Instructions (Signed)
Your physician has requested that you have an ankle brachial index (ABI). During this test an ultrasound and blood pressure cuff are used to evaluate the arteries that supply the arms and legs with blood. Allow thirty minutes for this exam. There are no restrictions or special instructions. Office will contact with results via phone or letter. Lasix 20mg  daily as needed for swelling or shortness of breath   Continue all other current medications.  Follow up in  4 months

## 2014-12-19 NOTE — Progress Notes (Signed)
Clinical Summary [name] is a 59 y.o.male seen today for follow up of the following medical problems.   1. CAD/ICM  - prior BMS to LAD in 2001, repeat BMS to LAD to 2009. 08/2013 LVEF 30-35%. Echo 02/2014 LVEF 15-20%.  - he has BiV AICD placed by Dr Caryl Comes, followed by Dr Lovena Le. Last check 11/2014 with normal function and no events.  - last cath showed tight non-dominant RCA lesion 07/2013, this was elected for medical management.  - he is followed in [facility] as well for CHF - medical therapy had been scaled back recently due to problems with orthostatic dizziness and falls  - admit 11/2014 with acute on chronic systolic HF, succesfully diuresed. Discharge weight 211 lbs. Denies any significant DOE since discharge. Home weight stable at 211-212.   2. Afib/Aflutter  - denies any palpitations  - compliant with eliquis  3. Hyperlipidemia  - compliant with statin crestor $RemoveBefore'40mg'[id number]$  daily   4. Leg pain - burning pain bilateral thighs and lower with walking inclines.   5. Bilateral carotid stenosis - 01/2014 carotid US <50% bilateral - no neuro symptoms   Past Medical History  Diagnosis Date  . Essential hypertension, benign   . Type 2 diabetes mellitus   . CAD (coronary artery disease)     a. BMS to LAD 2001 at [facility] b. PTCA/atherectomy ramus and BMS to LAD 2009  . Chronic systolic heart failure     a. 2D ECHO: 03/01/2014 EF 15-20%, severe, diffuse hypsokinesis. Mod LV dilation. Mod LVH, indeterminate diastolic dysfxn (poor study), high vent filling pressures, abnormal septal wall motion and dyssynergy c/w RV pacing, mild MR, mod LA dilation, RV systolic function mod reduced, mild TR, PA pressure 42.   Marland Kitchen GERD (gastroesophageal reflux disease)   . Cardiomyopathy, ischemic     a. BIV ICD St. Jude, LVEF 23%  . Paroxysmal atrial fibrillation     a. on amiodarone, digoxin and Eliquis  . Adenocarcinoma of rectum     a. 2008  . Prostate cancer     a. s/p seed implants with  chemo and radiation  . 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 St Jude  . TIA (transient ischemic attack)   . OSA on CPAP   . HLD (hyperlipidemia)   . Orthostatic hypotension   . Dizziness     a. chronic. Admission for this 07/18/2014  . History of falling July 2015    due to dizziness from medications  . CHF (congestive heart failure)   . Renal insufficiency      No Known Allergies   Current Outpatient Prescriptions  Medication Sig Dispense Refill  . amiodarone (PACERONE) 200 MG tablet Take 1 tablet (200 mg total) by mouth daily. 30 tablet 0  . apixaban (ELIQUIS) 5 MG TABS tablet Take 5 mg by mouth 2 (two) times daily.    . citalopram (CELEXA) 20 MG tablet Take 1 tablet (20 mg total) by mouth daily. 30 tablet 0  . co-enzyme Q-10 50 MG capsule Take 50 mg by mouth every morning.     . digoxin (LANOXIN) 0.125 MG tablet Take 1 tablet (0.125 mg total) by mouth every other day. 30 tablet 6  . enalapril (VASOTEC) 2.5 MG tablet Take 1 tablet (2.5 mg total) by mouth daily. 30 tablet 1  . fluticasone (FLONASE) 50 MCG/ACT nasal spray Place 1 spray into both nostrils daily as needed for allergies.     Marland Kitchen insulin aspart (  NOVOLOG FLEXPEN) 100 UNIT/ML FlexPen Inject 6 Units into the skin 3 (three) times daily with meals. 15 mL 11  . Insulin Glargine (LANTUS) 100 UNIT/ML Solostar Pen Inject 20 Units into the skin daily at 10 pm. 15 mL 11  . isosorbide dinitrate (ISORDIL) 30 MG tablet Take 15 mg by mouth 2 (two) times daily as needed (shortness of breath).     . meclizine (ANTIVERT) 25 MG tablet Take 25 mg by mouth as needed for dizziness.    . metoprolol succinate (TOPROL-XL) 100 MG 24 hr tablet Take 100 mg by mouth daily. Take with or immediately following a meal.    . metoprolol succinate (TOPROL-XL) 25 MG 24 hr tablet Take 1 tablet (25 mg total) by mouth 2 (two) times daily. 60 tablet 2  . niacin (NIASPAN) 1000 MG CR tablet Take 1,000 mg by mouth at bedtime.      .  nitroGLYCERIN (NITROLINGUAL) 0.4 MG/SPRAY spray Place 1 spray under the tongue every 5 (five) minutes as needed. angina 12 g 3  . Omega-3 Fatty Acids (FISH OIL) 1200 MG CAPS Take 1,200 mg by mouth 2 (two) times daily.      . pantoprazole (PROTONIX) 40 MG tablet Take 1 tablet (40 mg total) by mouth daily. 30 tablet 6  . peg 3350 powder (MOVIPREP) 100 G SOLR Take 1 kit (200 g total) by mouth as directed. 1 kit 0  . ranolazine (RANEXA) 1000 MG SR tablet Take 1 tablet (1,000 mg total) by mouth 2 (two) times daily. 60 tablet 9  . rosuvastatin (CRESTOR) 40 MG tablet Take 1 tablet (40 mg total) by mouth at bedtime. 90 tablet 3  . spironolactone (ALDACTONE) 25 MG tablet Take 12.5 mg by mouth daily.     No current facility-administered medications for this visit.     Past Surgical History  Procedure Laterality Date  . Internal defibrillator and pacemaker  2010    x2 St. Jude device  . Abdominal and perineal resection of rectum with total mesorectal excision  10/04/2007  . Colonoscopy      05/17/2007. IMPRESSION: Semilunar, apple-core neoplasm low in the rectum (palpable on digital rectal exam) beginning at 5 cm and corkscrewing up 5 cm in length. This was a low rectal lesion consistent with colorectal carcinoma. It was biosied multiple times. The upstream colon all the way to the cecum appeared normal. Recommendations: Followup on path. Surgical Consultation   . Colonoscopy  09/14/2011    Dr. Gala Romney: via colostomy, Single pedunculated benign inflammatory polyp. Due for surveillance Oct 2015  . Colostomy    . Colonoscopy N/A 07/02/2014    Procedure: COLONOSCOPY;  Surgeon: Daneil Dolin, MD;  Location: [facility];  Service: Endoscopy;  Laterality: N/A;  7:30 / COLONOSCOPY THRU COLOSTOMY  . Esophagogastroduodenoscopy N/A 07/02/2014    Procedure: ESOPHAGOGASTRODUODENOSCOPY (EGD);  Surgeon: Daneil Dolin, MD;  Location: [facility];  Service: Endoscopy;  Laterality: N/A;  7:30  . Savory dilation N/A  07/02/2014    Procedure: SAVORY DILATION;  Surgeon: Daneil Dolin, MD;  Location: [facility];  Service: Endoscopy;  Laterality: N/A;  7:30  . Maloney dilation N/A 07/02/2014    Procedure: Venia Minks DILATION;  Surgeon: Daneil Dolin, MD;  Location: [facility];  Service: Endoscopy;  Laterality: N/A;  7:30  . Portacath placement  06/2007    "removed ~ 1 yr later"  . Cardiac catheterization  08/2001  . Coronary angioplasty with stent placement  2001; ~ 2006    "  1 + 1"   . Left heart catheterization with coronary angiogram N/A 07/13/2013    Procedure: LEFT HEART CATHETERIZATION WITH CORONARY ANGIOGRAM;  Surgeon: Lorretta Harp, MD;  Location: [facility] CATH LAB;  Service: Cardiovascular;  Laterality: N/A;  . Colonoscopy N/A 12/11/2014    Procedure: COLONOSCOPY VIA COLOSTOMY;  Surgeon: Daneil Dolin, MD;  Location: [facility];  Service: Endoscopy;  Laterality: N/A;  730   . Esophagogastroduodenoscopy N/A 12/11/2014    Procedure: ESOPHAGOGASTRODUODENOSCOPY (EGD);  Surgeon: Daneil Dolin, MD;  Location: [facility];  Service: Endoscopy;  Laterality: N/A;     No Known Allergies    Family History  Problem Relation Age of Onset  . Colon cancer Mother 60  . Colon cancer Sister 108  . Coronary artery disease Father   . Colon cancer Other     2 cousins, succumbed to illness     Social History [name] reports that he has never smoked. He has never used smokeless tobacco. [name] reports that he does not drink alcohol.   Review of Systems CONSTITUTIONAL: No weight loss, fever, chills, weakness or fatigue.  HEENT: Eyes: No visual loss, blurred vision, double vision or yellow sclerae.No hearing loss, sneezing, congestion, runny nose or sore throat.  SKIN: No rash or itching.  CARDIOVASCULAR: per HPI RESPIRATORY: No shortness of breath, cough or sputum.  GASTROINTESTINAL: No anorexia, nausea, vomiting or diarrhea. No abdominal pain or blood.  GENITOURINARY: No burning on urination, no  polyuria NEUROLOGICAL: No headache, dizziness, syncope, paralysis, ataxia, numbness or tingling in the extremities. No change in bowel or bladder control.  MUSCULOSKELETAL: No muscle, back pain, joint pain or stiffness.  LYMPHATICS: No enlarged nodes. No history of splenectomy.  PSYCHIATRIC: No history of depression or anxiety.  ENDOCRINOLOGIC: No reports of sweating, cold or heat intolerance. No polyuria or polydipsia.  Marland Kitchen   Physical Examination p 93 bp 112/75 Wt 211 lbs BMI 30 Gen: resting comfortably, no acute distress HEENT: no scleral icterus, pupils equal round and reactive, no palptable cervical adenopathy,  CV: RRR, no m/r/g, no JVD. 1+ bilateral DP/PT pulses Resp: Clear to auscultation bilaterally GI: abdomen is soft, non-tender, non-distended, normal bowel sounds, no hepatosplenomegaly MSK: extremities are warm, no edema.  Skin: warm, no rash Neuro:  no focal deficits Psych: appropriate affect   Diagnostic Studies 08/2013 Echo  LVEF 30-35%, + WMAs, grade I diastolic dysfunction, mild MR,  07/2013 Cath  RESULTS: Left main coronary artery. The left main coronary artery is  free of significant disease.  Left anterior descending artery. The left anterior descending artery  gave rise to three septal perforators and two diagonal branches. These  and __________ were free of significant disease.  The circumflex artery: The circumflex artery gave rise to a ramus  [name], a marginal [name], and a small posterolateral [name]. These  vessels were free of significant disease.  The right coronary artery: The right coronary artery was a moderate-  sized vessel that gave rise to posterior descending [name] and three  posterolateral branches. The vessel also gave rise to a right  ventricular [name]. There was an 80-90% stenosis at the ostium of the  right coronary artery. We could see some collateral filling from the  left coronary artery, which appeared to be right  ventricular branches.  The left ventriculogram: The left ventriculogram performed in the RAO  projection showed good wall motion with no areas of hypokinesis. The  estimated ejection fraction was 60%.  The left ventricular  pressure was 154/28 and __________ pressure was  154/86 with a mean of 116.  CONCLUSION: Coronary artery disease with 80-90% stenosis in the ostium  of the right coronary artery, no significant obstruction in the LAD and  circumflex arteries and normal LV function.  RECOMMENDATIONS: The patient has a tight what appears to be flow-  limiting lesion in the ostium of the right coronary artery. We will  plan to schedule the patient for to return for percutaneous coronary  intervention on Tuesday April 13. In the meantime, we will start Plavix  and continue aspirin and start the Toprol-XL 25 mg.  02/2014 Echo  Study Conclusions  - Procedure narrative: Transthoracic echocardiography. Image quality was suboptimal. The study was technically difficult, as a result of poor sound wave transmission. - Left ventricle: Systolic function is severely reduced, estimated EF 15-20%. Severe diffuse hypokinesis is noted. The cavity size was moderately dilated. Wall thickness was increased in a pattern of moderate LVH. Diastolic dysfunction is seen, indeterminate grade. The apex was poorly visualized. Doppler parameters are consistent with high ventricular filling pressure. - Ventricular septum: Septal motion showed abnormal function and dyssynergy. These changes are consistent with right ventricular pacing. - Aortic valve: Trileaflet; mildly thickened leaflets. There was no stenosis. - Mitral valve: Mildly thickened leaflets . Mild tethering of leaflet motion due to severe left ventricular dysfunction. Mild regurgitation. - Left atrium: The atrium was mildly to moderately dilated. - Right ventricle: The cavity size was normal. Wall thickness was normal. Pacer wire or  catheter noted in right ventricle. Systolic function was mildly to moderately reduced. - Right atrium: Pacer wire or catheter noted in right atrium. - Atrial septum: The septum bowed from left to right, consistent with increased left atrial pressure. - Tricuspid valve: Mild regurgitation. - Pulmonary arteries: PA peak pressure: 64mm Hg (S). Mildly elevated pulmonary pressures.        Assessment and Plan  1.CAD/ICM/Chronic systolic HF - LVEF 00-17% by echo 02/2014, NYHA III, he has a BiV AICD with normal function last check 11/2014 - appears euvolemic today  - medical therapy had been limited due to low blood pressures, dizziness, and recent falls. These symptoms have since resolved and he is tolerating his current regimen. Will continue current meds  - may be reasonable to try mild uptitration of beta blocker since doing well from dizziness standpoint, will defer to his chf specialist in [place].   2. Hyperlipidemia  - continue high dose statin in setting of know CAD.   3. Afib /aflutter  - no current symptoms, continue current meds  - discussed possible ablation with EP last visit for flutter, currently patient is not interested.  - Labs on amio 05/2014 TSH 1.64, overall normal LFTs though AST slightly elevated.  - Repeat surveillance labs next visit  4. Leg pain - symptoms suggestive of possible claudication. Decreased pulses on exam, will obtain ABIs.   F/u 4 months      Arnoldo Lenis, M.D.,

## 2014-12-24 ENCOUNTER — Encounter (HOSPITAL_COMMUNITY): Payer: Self-pay

## 2014-12-24 ENCOUNTER — Emergency Department (HOSPITAL_COMMUNITY): Payer: Medicare Other

## 2014-12-24 ENCOUNTER — Inpatient Hospital Stay (HOSPITAL_COMMUNITY)
Admission: EM | Admit: 2014-12-24 | Discharge: 2015-01-06 | DRG: 682 | Disposition: A | Discharge status: Home Health | Payer: Medicare Other | Attending: Internal Medicine | Admitting: Internal Medicine

## 2014-12-24 DIAGNOSIS — Z8546 Personal history of malignant neoplasm of prostate: Secondary | ICD-10-CM

## 2014-12-24 DIAGNOSIS — N179 Acute kidney failure, unspecified: Secondary | ICD-10-CM | POA: Diagnosis not present

## 2014-12-24 DIAGNOSIS — I130 Hypertensive heart and chronic kidney disease with heart failure and stage 1 through stage 4 chronic kidney disease, or unspecified chronic kidney disease: Secondary | ICD-10-CM | POA: Diagnosis present

## 2014-12-24 DIAGNOSIS — G4730 Sleep apnea, unspecified: Secondary | ICD-10-CM | POA: Diagnosis not present

## 2014-12-24 DIAGNOSIS — R1084 Generalized abdominal pain: Secondary | ICD-10-CM

## 2014-12-24 DIAGNOSIS — Z79899 Other long term (current) drug therapy: Secondary | ICD-10-CM | POA: Diagnosis not present

## 2014-12-24 DIAGNOSIS — G9341 Metabolic encephalopathy: Secondary | ICD-10-CM | POA: Diagnosis not present

## 2014-12-24 DIAGNOSIS — R4182 Altered mental status, unspecified: Secondary | ICD-10-CM | POA: Diagnosis not present

## 2014-12-24 DIAGNOSIS — N189 Chronic kidney disease, unspecified: Secondary | ICD-10-CM | POA: Diagnosis not present

## 2014-12-24 DIAGNOSIS — E1122 Type 2 diabetes mellitus with diabetic chronic kidney disease: Secondary | ICD-10-CM | POA: Diagnosis present

## 2014-12-24 DIAGNOSIS — R0602 Shortness of breath: Secondary | ICD-10-CM

## 2014-12-24 DIAGNOSIS — R109 Unspecified abdominal pain: Secondary | ICD-10-CM | POA: Diagnosis not present

## 2014-12-24 DIAGNOSIS — Z85048 Personal history of other malignant neoplasm of rectum, rectosigmoid junction, and anus: Secondary | ICD-10-CM | POA: Diagnosis not present

## 2014-12-24 DIAGNOSIS — Z933 Colostomy status: Secondary | ICD-10-CM | POA: Diagnosis not present

## 2014-12-24 DIAGNOSIS — N170 Acute kidney failure with tubular necrosis: Secondary | ICD-10-CM | POA: Diagnosis not present

## 2014-12-24 DIAGNOSIS — I2510 Atherosclerotic heart disease of native coronary artery without angina pectoris: Secondary | ICD-10-CM | POA: Diagnosis present

## 2014-12-24 DIAGNOSIS — Z950 Presence of cardiac pacemaker: Secondary | ICD-10-CM | POA: Diagnosis not present

## 2014-12-24 DIAGNOSIS — I255 Ischemic cardiomyopathy: Secondary | ICD-10-CM | POA: Diagnosis present

## 2014-12-24 DIAGNOSIS — I10 Essential (primary) hypertension: Secondary | ICD-10-CM | POA: Diagnosis not present

## 2014-12-24 DIAGNOSIS — Z7901 Long term (current) use of anticoagulants: Secondary | ICD-10-CM

## 2014-12-24 DIAGNOSIS — E872 Acidosis, unspecified: Secondary | ICD-10-CM | POA: Diagnosis not present

## 2014-12-24 DIAGNOSIS — G4733 Obstructive sleep apnea (adult) (pediatric): Secondary | ICD-10-CM | POA: Diagnosis present

## 2014-12-24 DIAGNOSIS — Z9221 Personal history of antineoplastic chemotherapy: Secondary | ICD-10-CM | POA: Diagnosis not present

## 2014-12-24 DIAGNOSIS — Z800 Family history of malignant neoplasm of digestive organs: Secondary | ICD-10-CM

## 2014-12-24 DIAGNOSIS — Z95810 Presence of automatic (implantable) cardiac defibrillator: Secondary | ICD-10-CM

## 2014-12-24 DIAGNOSIS — Z923 Personal history of irradiation: Secondary | ICD-10-CM | POA: Diagnosis not present

## 2014-12-24 DIAGNOSIS — Z794 Long term (current) use of insulin: Secondary | ICD-10-CM | POA: Diagnosis not present

## 2014-12-24 DIAGNOSIS — I619 Nontraumatic intracerebral hemorrhage, unspecified: Secondary | ICD-10-CM

## 2014-12-24 DIAGNOSIS — I5022 Chronic systolic (congestive) heart failure: Secondary | ICD-10-CM | POA: Diagnosis present

## 2014-12-24 DIAGNOSIS — Z7982 Long term (current) use of aspirin: Secondary | ICD-10-CM | POA: Diagnosis not present

## 2014-12-24 DIAGNOSIS — S066X0D Traumatic subarachnoid hemorrhage without loss of consciousness, subsequent encounter: Secondary | ICD-10-CM | POA: Diagnosis not present

## 2014-12-24 DIAGNOSIS — Z8673 Personal history of transient ischemic attack (TIA), and cerebral infarction without residual deficits: Secondary | ICD-10-CM

## 2014-12-24 DIAGNOSIS — E1129 Type 2 diabetes mellitus with other diabetic kidney complication: Secondary | ICD-10-CM | POA: Diagnosis not present

## 2014-12-24 DIAGNOSIS — R569 Unspecified convulsions: Secondary | ICD-10-CM | POA: Diagnosis not present

## 2014-12-24 DIAGNOSIS — D649 Anemia, unspecified: Secondary | ICD-10-CM | POA: Diagnosis present

## 2014-12-24 DIAGNOSIS — I4891 Unspecified atrial fibrillation: Secondary | ICD-10-CM | POA: Diagnosis not present

## 2014-12-24 DIAGNOSIS — Z9989 Dependence on other enabling machines and devices: Secondary | ICD-10-CM

## 2014-12-24 DIAGNOSIS — Z9181 History of falling: Secondary | ICD-10-CM

## 2014-12-24 DIAGNOSIS — I472 Ventricular tachycardia: Secondary | ICD-10-CM | POA: Diagnosis not present

## 2014-12-24 DIAGNOSIS — Z8249 Family history of ischemic heart disease and other diseases of the circulatory system: Secondary | ICD-10-CM

## 2014-12-24 DIAGNOSIS — R531 Weakness: Secondary | ICD-10-CM | POA: Diagnosis not present

## 2014-12-24 DIAGNOSIS — I480 Paroxysmal atrial fibrillation: Secondary | ICD-10-CM | POA: Diagnosis present

## 2014-12-24 DIAGNOSIS — E785 Hyperlipidemia, unspecified: Secondary | ICD-10-CM | POA: Diagnosis present

## 2014-12-24 DIAGNOSIS — K5900 Constipation, unspecified: Secondary | ICD-10-CM | POA: Diagnosis present

## 2014-12-24 DIAGNOSIS — E860 Dehydration: Secondary | ICD-10-CM | POA: Diagnosis present

## 2014-12-24 DIAGNOSIS — E1165 Type 2 diabetes mellitus with hyperglycemia: Secondary | ICD-10-CM | POA: Diagnosis present

## 2014-12-24 DIAGNOSIS — K219 Gastro-esophageal reflux disease without esophagitis: Secondary | ICD-10-CM | POA: Diagnosis present

## 2014-12-24 DIAGNOSIS — IMO0002 Reserved for concepts with insufficient information to code with codable children: Secondary | ICD-10-CM | POA: Diagnosis present

## 2014-12-24 DIAGNOSIS — S066X0A Traumatic subarachnoid hemorrhage without loss of consciousness, initial encounter: Secondary | ICD-10-CM | POA: Diagnosis not present

## 2014-12-24 DIAGNOSIS — Z6830 Body mass index (BMI) 30.0-30.9, adult: Secondary | ICD-10-CM | POA: Diagnosis not present

## 2014-12-24 DIAGNOSIS — R42 Dizziness and giddiness: Secondary | ICD-10-CM | POA: Diagnosis not present

## 2014-12-24 DIAGNOSIS — R5383 Other fatigue: Secondary | ICD-10-CM | POA: Diagnosis not present

## 2014-12-24 DIAGNOSIS — E119 Type 2 diabetes mellitus without complications: Secondary | ICD-10-CM

## 2014-12-24 DIAGNOSIS — I609 Nontraumatic subarachnoid hemorrhage, unspecified: Secondary | ICD-10-CM | POA: Diagnosis not present

## 2014-12-24 DIAGNOSIS — E669 Obesity, unspecified: Secondary | ICD-10-CM | POA: Diagnosis present

## 2014-12-24 DIAGNOSIS — Z955 Presence of coronary angioplasty implant and graft: Secondary | ICD-10-CM

## 2014-12-24 DIAGNOSIS — N183 Chronic kidney disease, stage 3 (moderate): Secondary | ICD-10-CM | POA: Diagnosis present

## 2014-12-24 DIAGNOSIS — W19XXXA Unspecified fall, initial encounter: Secondary | ICD-10-CM | POA: Diagnosis not present

## 2014-12-24 DIAGNOSIS — G9340 Encephalopathy, unspecified: Secondary | ICD-10-CM | POA: Insufficient documentation

## 2014-12-24 DIAGNOSIS — S299XXA Unspecified injury of thorax, initial encounter: Secondary | ICD-10-CM | POA: Diagnosis not present

## 2014-12-24 DIAGNOSIS — R51 Headache: Secondary | ICD-10-CM | POA: Diagnosis not present

## 2014-12-24 DIAGNOSIS — C20 Malignant neoplasm of rectum: Secondary | ICD-10-CM | POA: Diagnosis not present

## 2014-12-24 DIAGNOSIS — S0990XD Unspecified injury of head, subsequent encounter: Secondary | ICD-10-CM | POA: Diagnosis not present

## 2014-12-24 DIAGNOSIS — I6010 Nontraumatic subarachnoid hemorrhage from unspecified middle cerebral artery: Secondary | ICD-10-CM | POA: Diagnosis not present

## 2014-12-24 LAB — URINE MICROSCOPIC-ADD ON

## 2014-12-24 LAB — URINALYSIS, ROUTINE W REFLEX MICROSCOPIC
Bilirubin Urine: NEGATIVE
GLUCOSE, UA: 250 mg/dL — AB
Ketones, ur: NEGATIVE mg/dL
Leukocytes, UA: NEGATIVE
Nitrite: NEGATIVE
PH: 5 (ref 5.0–8.0)
Protein, ur: 30 mg/dL — AB
SPECIFIC GRAVITY, URINE: 1.025 (ref 1.005–1.030)
UROBILINOGEN UA: 0.2 mg/dL (ref 0.0–1.0)

## 2014-12-24 LAB — COMPREHENSIVE METABOLIC PANEL
ALT: 18 U/L (ref 0–53)
AST: 24 U/L (ref 0–37)
Albumin: 3.2 g/dL — ABNORMAL LOW (ref 3.5–5.2)
Alkaline Phosphatase: 77 U/L (ref 39–117)
Anion gap: 11 (ref 5–15)
BUN: 59 mg/dL — ABNORMAL HIGH (ref 6–23)
CALCIUM: 9.1 mg/dL (ref 8.4–10.5)
CO2: 20 mmol/L (ref 19–32)
Chloride: 101 mEq/L (ref 96–112)
Creatinine, Ser: 5.69 mg/dL — ABNORMAL HIGH (ref 0.50–1.35)
GFR calc non Af Amer: 10 mL/min — ABNORMAL LOW (ref >90)
GFR, EST AFRICAN AMERICAN: 11 mL/min — AB (ref >90)
Glucose, Bld: 206 mg/dL — ABNORMAL HIGH (ref 70–99)
POTASSIUM: 4.6 mmol/L (ref 3.5–5.1)
Sodium: 132 mmol/L — ABNORMAL LOW (ref 135–145)
Total Bilirubin: 0.8 mg/dL (ref 0.3–1.2)
Total Protein: 7.6 g/dL (ref 6.0–8.3)

## 2014-12-24 LAB — CBC WITH DIFFERENTIAL/PLATELET
BASOS PCT: 0 % (ref 0–1)
Basophils Absolute: 0 10*3/uL (ref 0.0–0.1)
Eosinophils Absolute: 0 10*3/uL (ref 0.0–0.7)
Eosinophils Relative: 0 % (ref 0–5)
HCT: 36 % — ABNORMAL LOW (ref 39.0–52.0)
Hemoglobin: 12.1 g/dL — ABNORMAL LOW (ref 13.0–17.0)
LYMPHS ABS: 0.7 10*3/uL (ref 0.7–4.0)
Lymphocytes Relative: 8 % — ABNORMAL LOW (ref 12–46)
MCH: 29.7 pg (ref 26.0–34.0)
MCHC: 33.6 g/dL (ref 30.0–36.0)
MCV: 88.2 fL (ref 78.0–100.0)
MONO ABS: 0.4 10*3/uL (ref 0.1–1.0)
Monocytes Relative: 5 % (ref 3–12)
NEUTROS PCT: 87 % — AB (ref 43–77)
Neutro Abs: 8 10*3/uL — ABNORMAL HIGH (ref 1.7–7.7)
Platelets: 171 10*3/uL (ref 150–400)
RBC: 4.08 MIL/uL — ABNORMAL LOW (ref 4.22–5.81)
RDW: 13.4 % (ref 11.5–15.5)
WBC: 9.2 10*3/uL (ref 4.0–10.5)

## 2014-12-24 LAB — GLUCOSE, CAPILLARY
Glucose-Capillary: 175 mg/dL — ABNORMAL HIGH (ref 70–99)
Glucose-Capillary: 139 mg/dL — ABNORMAL HIGH (ref 70–99)
Glucose-Capillary: 217 mg/dL — ABNORMAL HIGH (ref 70–99)
Glucose-Capillary: 165 mg/dL — ABNORMAL HIGH (ref 70–99)

## 2014-12-24 LAB — HEMOGLOBIN A1C
Hgb A1c MFr Bld: 10.6 % — ABNORMAL HIGH (ref <5.7)
MEAN PLASMA GLUCOSE: 258 mg/dL — AB (ref <117)

## 2014-12-24 LAB — TROPONIN I
Troponin I: 0.03 ng/mL (ref <0.031)
Troponin I: 0.03 ng/mL (ref <0.031)
Troponin I: 0.03 ng/mL (ref <0.031)

## 2014-12-24 LAB — BRAIN NATRIURETIC PEPTIDE: B NATRIURETIC PEPTIDE 5: 331 pg/mL — AB (ref 0.0–100.0)

## 2014-12-24 MED ORDER — INSULIN ASPART 100 UNIT/ML ~~LOC~~ SOLN
0.0000 [IU] | Freq: Three times a day (TID) | SUBCUTANEOUS | Status: DC
  Administered 2014-12-24: 5 [IU] via SUBCUTANEOUS
  Administered 2014-12-24 – 2014-12-25 (×3): 2 [IU] via SUBCUTANEOUS
  Administered 2014-12-25: 3 [IU] via SUBCUTANEOUS
  Administered 2014-12-26 (×2): 2 [IU] via SUBCUTANEOUS
  Administered 2014-12-26: 3 [IU] via SUBCUTANEOUS
  Administered 2014-12-27: 5 [IU] via SUBCUTANEOUS
  Administered 2014-12-27 – 2014-12-28 (×2): 2 [IU] via SUBCUTANEOUS
  Administered 2014-12-28: 3 [IU] via SUBCUTANEOUS
  Administered 2014-12-28 – 2014-12-29 (×2): 2 [IU] via SUBCUTANEOUS
  Administered 2014-12-29: 5 [IU] via SUBCUTANEOUS
  Administered 2014-12-29: 3 [IU] via SUBCUTANEOUS
  Administered 2014-12-30: 2 [IU] via SUBCUTANEOUS
  Administered 2014-12-30: 3 [IU] via SUBCUTANEOUS
  Administered 2014-12-31: 2 [IU] via SUBCUTANEOUS
  Administered 2015-01-01: 3 [IU] via SUBCUTANEOUS
  Administered 2015-01-03: 5 [IU] via SUBCUTANEOUS
  Administered 2015-01-03: 3 [IU] via SUBCUTANEOUS
  Administered 2015-01-04 (×2): 2 [IU] via SUBCUTANEOUS
  Administered 2015-01-05 – 2015-01-06 (×2): 3 [IU] via SUBCUTANEOUS

## 2014-12-24 MED ORDER — GLUCERNA SHAKE PO LIQD
237.0000 mL | Freq: Two times a day (BID) | ORAL | Status: DC
  Administered 2014-12-24 – 2015-01-06 (×21): 237 mL via ORAL

## 2014-12-24 MED ORDER — ISOSORBIDE DINITRATE 10 MG PO TABS
15.0000 mg | ORAL_TABLET | Freq: Two times a day (BID) | ORAL | Status: DC | PRN
  Filled 2014-12-24: qty 1

## 2014-12-24 MED ORDER — ACETAMINOPHEN 325 MG PO TABS
650.0000 mg | ORAL_TABLET | Freq: Four times a day (QID) | ORAL | Status: DC | PRN
  Administered 2014-12-24 – 2015-01-04 (×6): 650 mg via ORAL
  Filled 2014-12-24 (×6): qty 2

## 2014-12-24 MED ORDER — SODIUM CHLORIDE 0.9 % IV BOLUS (SEPSIS)
250.0000 mL | Freq: Once | INTRAVENOUS | Status: AC
  Administered 2014-12-24: 250 mL via INTRAVENOUS

## 2014-12-24 MED ORDER — SODIUM CHLORIDE 0.9 % IV SOLN
INTRAVENOUS | Status: DC
  Administered 2014-12-24: 11:00:00 via INTRAVENOUS

## 2014-12-24 MED ORDER — APIXABAN 5 MG PO TABS
5.0000 mg | ORAL_TABLET | Freq: Two times a day (BID) | ORAL | Status: DC
  Administered 2014-12-24: 5 mg via ORAL
  Filled 2014-12-24: qty 1

## 2014-12-24 MED ORDER — INSULIN GLARGINE 100 UNIT/ML ~~LOC~~ SOLN
12.0000 [IU] | Freq: Every day | SUBCUTANEOUS | Status: DC
  Administered 2014-12-24 – 2015-01-05 (×12): 12 [IU] via SUBCUTANEOUS
  Filled 2014-12-24 (×14): qty 0.12

## 2014-12-24 MED ORDER — FLUTICASONE PROPIONATE 50 MCG/ACT NA SUSP: 1.0000 | Freq: Every day | NASAL | Status: DC | PRN

## 2014-12-24 MED ORDER — PANTOPRAZOLE SODIUM 40 MG PO TBEC
40.0000 mg | DELAYED_RELEASE_TABLET | Freq: Every day | ORAL | Status: DC
  Administered 2014-12-24 – 2015-01-06 (×14): 40 mg via ORAL
  Filled 2014-12-24 (×14): qty 1

## 2014-12-24 MED ORDER — CITALOPRAM HYDROBROMIDE 20 MG PO TABS
20.0000 mg | ORAL_TABLET | Freq: Every day | ORAL | Status: DC
  Administered 2014-12-24 – 2015-01-01 (×9): 20 mg via ORAL
  Filled 2014-12-24 (×9): qty 1

## 2014-12-24 MED ORDER — DIGOXIN 125 MCG PO TABS
0.1250 mg | ORAL_TABLET | ORAL | Status: DC
  Administered 2014-12-25 – 2014-12-31 (×4): 0.125 mg via ORAL
  Filled 2014-12-24 (×5): qty 1

## 2014-12-24 MED ORDER — RANOLAZINE ER 500 MG PO TB12
1000.0000 mg | ORAL_TABLET | Freq: Two times a day (BID) | ORAL | Status: DC
  Administered 2014-12-24 – 2015-01-01 (×17): 1000 mg via ORAL
  Filled 2014-12-24 (×18): qty 2

## 2014-12-24 MED ORDER — ROSUVASTATIN CALCIUM 20 MG PO TABS
40.0000 mg | ORAL_TABLET | Freq: Every day | ORAL | Status: DC
  Administered 2014-12-24 – 2015-01-05 (×12): 40 mg via ORAL
  Filled 2014-12-24 (×13): qty 2

## 2014-12-24 MED ORDER — SODIUM CHLORIDE 0.9 % IV SOLN: INTRAVENOUS | Status: AC

## 2014-12-24 MED ORDER — AMIODARONE HCL 200 MG PO TABS
200.0000 mg | ORAL_TABLET | Freq: Every day | ORAL | Status: DC
  Administered 2014-12-24 – 2015-01-06 (×14): 200 mg via ORAL
  Filled 2014-12-24 (×14): qty 1

## 2014-12-24 MED ORDER — INSULIN ASPART 100 UNIT/ML ~~LOC~~ SOLN
0.0000 [IU] | Freq: Every day | SUBCUTANEOUS | Status: DC
  Administered 2015-01-05: 2 [IU] via SUBCUTANEOUS

## 2014-12-24 NOTE — Progress Notes (Signed)
Nutrition Brief Note  Patient identified on the Malnutrition Screening Tool (MST) Report. He has hx of ischemic cardiomyopathy, Diabetes, CAD, CKD stage III and A. Fib.  Wt Readings from Last 15 Encounters:  12/24/14 211 lb (95.709 kg)  12/19/14 211 lb (95.709 kg)  12/10/14 214 lb (97.07 kg)  11/20/14 211 lb 3.2 oz (95.8 kg)  11/12/14 219 lb 6.4 oz (99.519 kg)  09/29/14 215 lb 12.8 oz (97.886 kg)  09/10/14 218 lb 1.9 oz (98.939 kg)  08/30/14 219 lb 14.4 oz (99.746 kg)  08/06/14 218 lb 6.4 oz (99.066 kg)  08/02/14 220 lb (99.791 kg)  07/24/14 213 lb (96.616 kg)  07/19/14 210 lb 5.1 oz (95.4 kg)  07/02/14 210 lb (95.255 kg)  06/06/14 217 lb (98.431 kg)  06/05/14 215 lb 12.8 oz (97.886 kg)    Body mass index is 30.28 kg/(m^2). Patient meets criteria for obesity class I based on current BMI. Usual body weight 210-220# past 6 months.   Current diet order is cho Modified. Labs and medications reviewed.   No nutrition interventions warranted at this time. If nutrition issues arise, please consult RD.   Colman Cater MS,RD,CSG,LDN Office: [phone] Pager: [phone]

## 2014-12-24 NOTE — ED Notes (Signed)
Report given to Sharyn Lull, RN for room 316.

## 2014-12-24 NOTE — ED Provider Notes (Signed)
CSN: [id number]     Arrival date & time 12/24/14  0347 History   First MD Initiated Contact with Patient 12/24/14 0406     Chief Complaint  Patient presents with  . Fall     (Consider location/radiation/quality/duration/timing/severity/associated sxs/prior Treatment) HPI Comments: She complains of upper back pain after a fall around 11 PM. Patient states he feels very weak and dehydrated and not well since he had a colonoscopy and EGD performed 10 days ago. He words he might be dehydrated. Patient with complicated cardiac history including CAD status post stents, ischemic cardiomyopathy with EF 15% with AICD in place. Paroxysmal atrial fibrillation on Eliquis. Patient with recurrent episodes of orthostatic dizziness. He feels he had an episode today of feeling dizzy upon standing with his legs giving out underneath from him. He did hit his head but did not lose consciousness. Denies syncope. Denies any chest pain or shortness of breath. Pain is in his upper back and worse with deep breathing. Denies any abdominal pain, nausea or vomiting. No neck pain. No focal weakness, numbness or tingling.  The history is provided by the patient and a relative.    Past Medical History  Diagnosis Date  . Essential hypertension, benign   . Type 2 diabetes mellitus   . CAD (coronary artery disease)     a. BMS to LAD 2001 at [facility] b. PTCA/atherectomy ramus and BMS to LAD 2009  . Chronic systolic heart failure     a. 2D ECHO: 03/01/2014 EF 15-20%, severe, diffuse hypsokinesis. Mod LV dilation. Mod LVH, indeterminate diastolic dysfxn (poor study), high vent filling pressures, abnormal septal wall motion and dyssynergy c/w RV pacing, mild MR, mod LA dilation, RV systolic function mod reduced, mild TR, PA pressure 42.   Marland Kitchen GERD (gastroesophageal reflux disease)   . Cardiomyopathy, ischemic     a. BIV ICD St. Jude, LVEF 23%  . Paroxysmal atrial fibrillation     a. on amiodarone, digoxin and Eliquis  .  Adenocarcinoma of rectum     a. 2008  . Prostate cancer     a. s/p seed implants with chemo and radiation  . 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 St Jude  . TIA (transient ischemic attack)   . OSA on CPAP   . HLD (hyperlipidemia)   . Orthostatic hypotension   . Dizziness     a. chronic. Admission for this 07/18/2014  . History of falling July 2015    due to dizziness from medications  . CHF (congestive heart failure)   . Renal insufficiency    Past Surgical History  Procedure Laterality Date  . Internal defibrillator and pacemaker  2010    x2 St. Jude device  . Abdominal and perineal resection of rectum with total mesorectal excision  10/04/2007  . Colonoscopy      05/17/2007. IMPRESSION: Semilunar, apple-core neoplasm low in the rectum (palpable on digital rectal exam) beginning at 5 cm and corkscrewing up 5 cm in length. This was a low rectal lesion consistent with colorectal carcinoma. It was biosied multiple times. The upstream colon all the way to the cecum appeared normal. Recommendations: Followup on path. Surgical Consultation   . Colonoscopy  09/14/2011    Dr. Gala Romney: via colostomy, Single pedunculated benign inflammatory polyp. Due for surveillance Oct 2015  . Colostomy    . Colonoscopy N/A 07/02/2014    Procedure: COLONOSCOPY;  Surgeon: Daneil Dolin, MD;  Location: [facility];  Service: Endoscopy;  Laterality: N/A;  7:30 / COLONOSCOPY THRU COLOSTOMY  . Esophagogastroduodenoscopy N/A 07/02/2014    Procedure: ESOPHAGOGASTRODUODENOSCOPY (EGD);  Surgeon: Daneil Dolin, MD;  Location: [facility];  Service: Endoscopy;  Laterality: N/A;  7:30  . Savory dilation N/A 07/02/2014    Procedure: SAVORY DILATION;  Surgeon: Daneil Dolin, MD;  Location: [facility];  Service: Endoscopy;  Laterality: N/A;  7:30  . Maloney dilation N/A 07/02/2014    Procedure: Venia Minks DILATION;  Surgeon: Daneil Dolin, MD;  Location: [facility];  Service: Endoscopy;   Laterality: N/A;  7:30  . Portacath placement  06/2007    "removed ~ 1 yr later"  . Cardiac catheterization  08/2001  . Coronary angioplasty with stent placement  2001; ~ 2006    "1 + 1"   . Left heart catheterization with coronary angiogram N/A 07/13/2013    Procedure: LEFT HEART CATHETERIZATION WITH CORONARY ANGIOGRAM;  Surgeon: Lorretta Harp, MD;  Location: [facility] CATH LAB;  Service: Cardiovascular;  Laterality: N/A;  . Colonoscopy N/A 12/11/2014    Procedure: COLONOSCOPY VIA COLOSTOMY;  Surgeon: Daneil Dolin, MD;  Location: [facility];  Service: Endoscopy;  Laterality: N/A;  730   . Esophagogastroduodenoscopy N/A 12/11/2014    Procedure: ESOPHAGOGASTRODUODENOSCOPY (EGD);  Surgeon: Daneil Dolin, MD;  Location: [facility];  Service: Endoscopy;  Laterality: N/A;   Family History  Problem Relation Age of Onset  . Colon cancer Mother 34  . Colon cancer Sister 55  . Coronary artery disease Father   . Colon cancer Other     2 cousins, succumbed to illness   History  Substance Use Topics  . Smoking status: Never Smoker   . Smokeless tobacco: Never Used  . Alcohol Use: No     Comment: Former user 45 years ago    Review of Systems  Constitutional: Positive for activity change, appetite change and fatigue. Negative for fever.  HENT: Negative for congestion and rhinorrhea.   Respiratory: Negative for cough, chest tightness and shortness of breath.   Cardiovascular: Negative for chest pain.  Gastrointestinal: Negative for nausea, vomiting and abdominal pain.  Genitourinary: Negative for dysuria, urgency and hematuria.  Musculoskeletal: Positive for myalgias, back pain and arthralgias.  Skin: Negative for rash.  Neurological: Negative for dizziness, weakness and headaches.  A complete 10 system review of systems was obtained and all systems are negative except as noted in the HPI and PMH.      Allergies  Review of patient's allergies indicates no known allergies.  Home  Medications   Prior to Admission medications   Medication Sig Start Date End Date Taking? Authorizing Provider  amiodarone (PACERONE) 200 MG tablet Take 1 tablet (200 mg total) by mouth daily. 05/23/14   Maryann Mikhail, DO  apixaban (ELIQUIS) 5 MG TABS tablet Take 5 mg by mouth 2 (two) times daily.    Historical Provider, MD  citalopram (CELEXA) 20 MG tablet Take 1 tablet (20 mg total) by mouth daily. 07/19/14   Arnoldo Lenis, MD  co-enzyme Q-10 50 MG capsule Take 50 mg by mouth every morning.     Historical Provider, MD  digoxin (LANOXIN) 0.125 MG tablet Take 1 tablet (0.125 mg total) by mouth every other day. 09/29/14   Erline Hau, MD  enalapril (VASOTEC) 2.5 MG tablet Take 1 tablet (2.5 mg total) by mouth daily. 07/19/14   Eileen Stanford, PA-C  fluticasone (FLONASE) 50 MCG/ACT nasal spray Place 1 spray into  both nostrils daily as needed for allergies.  11/21/13   Historical Provider, MD  furosemide (LASIX) 20 MG tablet Take 1 tablet (20 mg total) by mouth daily as needed (swelling or shortness of breath). 12/19/14   Antoine Poche, MD  insulin aspart (NOVOLOG FLEXPEN) 100 UNIT/ML FlexPen Inject 6 Units into the skin 3 (three) times daily with meals. 11/20/14   Gwenyth Bender, NP  Insulin Glargine (LANTUS) 100 UNIT/ML Solostar Pen Inject 20 Units into the skin daily at 10 pm. 11/20/14   Gwenyth Bender, NP  isosorbide dinitrate (ISORDIL) 30 MG tablet Take 15 mg by mouth 2 (two) times daily as needed (shortness of breath).  07/31/14   Historical Provider, MD  meclizine (ANTIVERT) 25 MG tablet Take 25 mg by mouth as needed for dizziness.    Historical Provider, MD  metoprolol succinate (TOPROL-XL) 100 MG 24 hr tablet Take 100 mg by mouth daily. Take with or immediately following a meal.    Historical Provider, MD  metoprolol succinate (TOPROL-XL) 25 MG 24 hr tablet Take 1 tablet (25 mg total) by mouth 2 (two) times daily. 09/29/14   Henderson Cloud, MD  niacin (NIASPAN)  1000 MG CR tablet Take 1,000 mg by mouth at bedtime.      Historical Provider, MD  nitroGLYCERIN (NITROLINGUAL) 0.4 MG/SPRAY spray Place 1 spray under the tongue every 5 (five) minutes as needed. angina 07/19/14   Antoine Poche, MD  Omega-3 Fatty Acids (FISH OIL) 1200 MG CAPS Take 1,200 mg by mouth 2 (two) times daily.      Historical Provider, MD  pantoprazole (PROTONIX) 40 MG tablet Take 1 tablet (40 mg total) by mouth daily. 07/19/14   Antoine Poche, MD  peg 3350 powder (MOVIPREP) 100 G SOLR Take 1 kit (200 g total) by mouth as directed. 11/12/14   Corbin Ade, MD  ranolazine (RANEXA) 1000 MG SR tablet Take 1 tablet (1,000 mg total) by mouth 2 (two) times daily. 05/13/14   Antoine Poche, MD  rosuvastatin (CRESTOR) 40 MG tablet Take 1 tablet (40 mg total) by mouth at bedtime. 09/27/14   Antoine Poche, MD  spironolactone (ALDACTONE) 25 MG tablet Take 12.5 mg by mouth daily.    Historical Provider, MD   BP 115/72 mmHg  Pulse 72  Resp 15  Ht 5\' 10"  (1.778 m)  Wt 211 lb (95.709 kg)  BMI 30.28 kg/m2  SpO2 100% Physical Exam  Constitutional: He is oriented to person, place, and time. He appears well-developed and well-nourished. No distress.  HENT:  Head: Normocephalic and atraumatic.  Mouth/Throat: Oropharynx is clear and moist. No oropharyngeal exudate.  Dry mucus membranes  Eyes: Conjunctivae and EOM are normal. Pupils are equal, round, and reactive to light.  Neck: Normal range of motion. Neck supple.  No meningismus.  Cardiovascular: Normal rate, regular rhythm, normal heart sounds and intact distal pulses.   No murmur heard. Pulmonary/Chest: Effort normal and breath sounds normal. No respiratory distress.  Abdominal: Soft. There is no tenderness. There is no rebound and no guarding.  LLQ colostomy  Musculoskeletal: Normal range of motion. He exhibits tenderness. He exhibits no edema.  TTP Thoracic spine No lumbar spine tenderness. No C spine tenderness  Neurological:  He is alert and oriented to person, place, and time. No cranial nerve deficit. He exhibits normal muscle tone. Coordination normal.  No ataxia on finger to nose bilaterally. No pronator drift. 5/5 strength throughout. CN 2-12 intact. Negative Romberg. Equal  grip strength. Sensation intact. Gait is normal.   Skin: Skin is warm.  Psychiatric: He has a normal mood and affect. His behavior is normal.  Nursing note and vitals reviewed.   ED Course  Procedures (including critical care time) Labs Review Labs Reviewed  CBC WITH DIFFERENTIAL - Abnormal; Notable for the following:    RBC 4.08 (*)    Hemoglobin 12.1 (*)    HCT 36.0 (*)    Neutrophils Relative % 87 (*)    Neutro Abs 8.0 (*)    Lymphocytes Relative 8 (*)    All other components within normal limits  COMPREHENSIVE METABOLIC PANEL - Abnormal; Notable for the following:    Sodium 132 (*)    Glucose, Bld 206 (*)    BUN 59 (*)    Creatinine, Ser 5.69 (*)    Albumin 3.2 (*)    GFR calc non Af Amer 10 (*)    GFR calc Af Amer 11 (*)    All other components within normal limits  BRAIN NATRIURETIC PEPTIDE - Abnormal; Notable for the following:    B Natriuretic Peptide 331.0 (*)    All other components within normal limits  URINALYSIS, ROUTINE W REFLEX MICROSCOPIC - Abnormal; Notable for the following:    Glucose, UA 250 (*)    Hgb urine dipstick SMALL (*)    Protein, ur 30 (*)    All other components within normal limits  TROPONIN I  URINE MICROSCOPIC-ADD ON    Imaging Review Dg Chest 2 View  12/24/2014   CLINICAL DATA:  Fall  EXAM: CHEST  2 VIEW  COMPARISON:  11/19/2014  FINDINGS: Biventricular ICD/pacer leads are in unchanged position. Unchanged cardiomegaly. Aortic and hilar contours are normal. Previously noted CHF has resolved. No pneumothorax. No evidence of fracture.  IMPRESSION: No active cardiopulmonary disease.   Electronically Signed   By: Jorje Guild M.D.   On: 12/24/2014 05:23   Dg Thoracic Spine 2  View  12/24/2014   CLINICAL DATA:  Fall  EXAM: THORACIC SPINE - 2 VIEW  COMPARISON:  Chest x-ray 05/19/2014  FINDINGS: There is no evidence of thoracic spine fracture or subluxation.  Chronic exaggerated thoracic kyphosis with diffuse spondylotic endplate spurring. No focally notable/advanced disc narrowing or endplate erosion.  IMPRESSION: No acute osseous finding.   Electronically Signed   By: Jorje Guild M.D.   On: 12/24/2014 05:26   Ct Head Wo Contrast  12/24/2014   CLINICAL DATA:  Dizziness and weakness for a few days.  EXAM: CT HEAD WITHOUT CONTRAST  TECHNIQUE: Contiguous axial images were obtained from the base of the skull through the vertex without intravenous contrast.  COMPARISON:  CT of the head July 17, 2014  FINDINGS: The ventricles and sulci are normal for age. No intraparenchymal hemorrhage, mass effect nor midline shift. Mild supratentorial white matter hypodensities are within normal range for patient's age and though non-specific suggest sequelae of chronic small vessel ischemic disease. No acute large vascular territory infarcts.  Small amount of dense extra-axial blood products at the level of the quadrigeminal and ambient cistern (axial 11/36). Basal cisterns are patent. Severe calcific atherosclerosis of the carotid siphons and included vertebral arteries.  No skull fracture. The included ocular globes and orbital contents are non-suspicious. The mastoid aircells and included paranasal sinuses are well-aerated.  IMPRESSION: Small amount of blood products in the quadrigeminal/ambient cistern ; this can be seen with perimesencephalic subarachnoid hemorrhage (typically not aneurysmal).  Mild white matter changes can be seen with  chronic small vessel ischemic disease.  Acute findings discussed with and reconfirmed by Dr.Anelly Samarin on 12/24/2014 at 5:19 am.   Electronically Signed   By: Elon Alas   On: 12/24/2014 05:20     EKG Interpretation   Date/Time:  Tuesday December 24 2014 04:38:45 EST Ventricular Rate:  73 PR Interval:    QRS Duration: 186 QT Interval:  518 QTC Calculation: 571 R Axis:   -177 Text Interpretation:  Complete AV block with wide QRS complex Nonspecific  intraventricular conduction delay Confirmed by Wyvonnia Dusky  MD, [place] Catarino  [phone]) on 12/24/2014 6:12:32 AM      MDM   Final diagnoses:  Fall  Acute renal failure, unspecified acute renal failure type  SAH (subarachnoid hemorrhage)   Back pain after fall due to recurrent dizziness and orthostasis. Neurologically intact. Did hit head. He is on Eliquis for atrial fibrillation. EKG with stable wide complex QRS, paced versus atrial flutter.  Contacted st jude to interrogate pacer.  CT head with SAH. Discussed with radiology. Patient with small amount of subarachnoid hemorrhage cistern. neurologically intact, awake and alert.  CT scan reviewed with Dr. Arnoldo Morale of neurosurgery. He feels this is a small amount of blood. Patient is neurologically intact. He does not recommend Eliquis reversal. He recommends holding medication and repeating scan tomorrow. He feels patient can stay at [facility]  Patient with new renal failure. Creatinine 5.7. Baseline around 2. Patient with poor EF at baseline. Gentle fluids given. Admission d/w Dr. Darrick Meigs.  CRITICAL CARE Performed by: Ezequiel Essex Total critical care time: 30 Critical care time was exclusive of separately billable procedures and treating other patients. Critical care was necessary to treat or prevent imminent or life-threatening deterioration. Critical care was time spent personally by me on the following activities: development of treatment plan with patient and/or surrogate as well as nursing, discussions with consultants, evaluation of patient's response to treatment, examination of patient, obtaining history from patient or surrogate, ordering and performing treatments and interventions, ordering and review of laboratory studies,  ordering and review of radiographic studies, pulse oximetry and re-evaluation of patient's condition.   Ezequiel Essex, MD 12/24/14 [phone]

## 2014-12-24 NOTE — ED Notes (Signed)
Pt states he feels very weak recently, thinks he might be dehydrated, states he fell a few hours ago and injured his back.

## 2014-12-24 NOTE — H&P (Signed)
[facility] History and Physical  ELVIN MCCARTIN VEH:[id number] DOB: [birth year] DOA: 12/24/2014  Referring physician:  PCP: Carlyle Dolly, F, MD   Chief Complaint: fall  HPI: [name] is a 59 y.o. male with a past medical history that includes ischemic cardiomyopathy with an EF 15% with AICD in place, diabetes, CAD status post stents, A. fib on requests, chronic kidney disease stage III, since to the emergency department after a fall. Initial evaluation reveals a subarachnoid hemorrhage acute on chronic kidney failure and dehydration.  Patient reports approximately 10 days ago he underwent a routine colonoscopy and EGD for screening purposes. He reports since that time his appetite has waxed and waned. He reports last evening his legs "just gave out". He denies losing consciousness but does report hitting his head. He denies any chest pain palpitation headache visual disturbances. He denies any fever chills recent illness. Denies any abdominal pain nausea vomiting diarrhea.  Workup in the emergency room significant for creatinine of 5.69, serum glucose 206, sodium 132, initial troponin negative, chest x-ray with no acute cardiopulmonary disease, CT of the head reveals subarachnoid hemorrhage.  The emergency department he is afebrile with a slightly soft blood pressure of 99/57. He is provided with 2 250 mL boluses of normal saline. At the time of my exam his blood pressures 112/65 heart rate 73.  Review of Systems:    10 point review of systems completed and all systems are negative except as indicated in the history of present illness  Past Medical History  Diagnosis Date  . Essential hypertension, benign   . Type 2 diabetes mellitus   . CAD (coronary artery disease)     a. BMS to LAD 2001 at [facility] b. PTCA/atherectomy ramus and BMS to LAD 2009  . Chronic systolic heart failure     a. 2D ECHO: 03/01/2014 EF 15-20%, severe, diffuse hypsokinesis. Mod LV dilation. Mod LVH,  indeterminate diastolic dysfxn (poor study), high vent filling pressures, abnormal septal wall motion and dyssynergy c/w RV pacing, mild MR, mod LA dilation, RV systolic function mod reduced, mild TR, PA pressure 42.   Marland Kitchen GERD (gastroesophageal reflux disease)   . Cardiomyopathy, ischemic     a. BIV ICD St. Jude, LVEF 23%  . Paroxysmal atrial fibrillation     a. on amiodarone, digoxin and Eliquis  . Adenocarcinoma of rectum     a. 2008  . Prostate cancer     a. s/p seed implants with chemo and radiation  . 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 St Jude  . TIA (transient ischemic attack)   . OSA on CPAP   . HLD (hyperlipidemia)   . Orthostatic hypotension   . Dizziness     a. chronic. Admission for this 07/18/2014  . History of falling July 2015    due to dizziness from medications  . CHF (congestive heart failure)   . Renal insufficiency    Past Surgical History  Procedure Laterality Date  . Internal defibrillator and pacemaker  2010    x2 St. Jude device  . Abdominal and perineal resection of rectum with total mesorectal excision  10/04/2007  . Colonoscopy      05/17/2007. IMPRESSION: Semilunar, apple-core neoplasm low in the rectum (palpable on digital rectal exam) beginning at 5 cm and corkscrewing up 5 cm in length. This was a low rectal lesion consistent with colorectal carcinoma. It was biosied multiple times. The upstream colon all the way to the cecum  appeared normal. Recommendations: Followup on path. Surgical Consultation   . Colonoscopy  09/14/2011    Dr. Gala Romney: via colostomy, Single pedunculated benign inflammatory polyp. Due for surveillance Oct 2015  . Colostomy    . Colonoscopy N/A 07/02/2014    Procedure: COLONOSCOPY;  Surgeon: Daneil Dolin, MD;  [facility];  Service: Endoscopy;  Laterality: N/A;  7:30 / COLONOSCOPY THRU COLOSTOMY  . Esophagogastroduodenoscopy N/A 07/02/2014    Procedure: ESOPHAGOGASTRODUODENOSCOPY (EGD);   Surgeon: Daneil Dolin, MD;  Location: [facility];  Service: Endoscopy;  Laterality: N/A;  7:30  . Savory dilation N/A 07/02/2014    Procedure: SAVORY DILATION;  Surgeon: Daneil Dolin, MD;  Location: [facility];  Service: Endoscopy;  Laterality: N/A;  7:30  . Maloney dilation N/A 07/02/2014    Procedure: Venia Minks DILATION;  Surgeon: Daneil Dolin, MD;  Location: [facility];  Service: Endoscopy;  Laterality: N/A;  7:30  . Portacath placement  06/2007    "removed ~ 1 yr later"  . Cardiac catheterization  08/2001  . Coronary angioplasty with stent placement  2001; ~ 2006    "1 + 1"   . Left heart catheterization with coronary angiogram N/A 07/13/2013    Procedure: LEFT HEART CATHETERIZATION WITH CORONARY ANGIOGRAM;  Surgeon: Lorretta Harp, MD;  Location: [facility] CATH LAB;  Service: Cardiovascular;  Laterality: N/A;  . Colonoscopy N/A 12/11/2014    Procedure: COLONOSCOPY VIA COLOSTOMY;  Surgeon: Daneil Dolin, MD;  [facility]: [facility];  Service: Endoscopy;  Laterality: N/A;  730   . Esophagogastroduodenoscopy N/A 12/11/2014    Procedure: ESOPHAGOGASTRODUODENOSCOPY (EGD);  Surgeon: Daneil Dolin, MD;  [facility];  Service: Endoscopy;  Laterality: N/A;   Social History:  reports that he has never smoked. He has never used smokeless tobacco. He reports that he does not drink alcohol or use illicit drugs. Patient is married lives at home with his wife. He is independent with ADLs No Known Allergies  Family History  Problem Relation Age of Onset  . Colon cancer Mother 66  . Colon cancer Sister 34  . Coronary artery disease Father   . Colon cancer Other     2 cousins, succumbed to illness     Prior to Admission medications   Medication Sig Start Date End Date Taking? Authorizing Provider  amiodarone (PACERONE) 200 MG tablet Take 1 tablet (200 mg total) by mouth daily. 05/23/14  Yes Maryann Mikhail, DO  apixaban (ELIQUIS) 5 MG TABS tablet Take 5 mg by mouth 2 (two) times  daily.   Yes Historical Provider, MD  aspirin EC 81 MG tablet Take 81 mg by mouth at bedtime.   Yes Historical Provider, MD  citalopram (CELEXA) 20 MG tablet Take 1 tablet (20 mg total) by mouth daily. 07/19/14  Yes Arnoldo Lenis, MD  co-enzyme Q-10 50 MG capsule Take 50 mg by mouth every morning.    Yes Historical Provider, MD  digoxin (LANOXIN) 0.125 MG tablet Take 1 tablet (0.125 mg total) by mouth every other day. 09/29/14  Yes Erline Hau, MD  enalapril (VASOTEC) 2.5 MG tablet Take 1 tablet (2.5 mg total) by mouth daily. 07/19/14  Yes Eileen Stanford, PA-C  fluticasone (FLONASE) 50 MCG/ACT nasal spray Place 1 spray into both nostrils daily as needed for allergies.  11/21/13  Yes Historical Provider, MD  furosemide (LASIX) 20 MG tablet Take 1 tablet (20 mg total) by mouth daily as needed (swelling or  shortness of breath). 12/19/14  Yes Arnoldo Lenis, MD  isosorbide dinitrate (ISORDIL) 30 MG tablet Take 15 mg by mouth 2 (two) times daily as needed (shortness of breath).  07/31/14  Yes Historical Provider, MD  meclizine (ANTIVERT) 25 MG tablet Take 25 mg by mouth as needed for dizziness.   Yes Historical Provider, MD  metoprolol succinate (TOPROL-XL) 100 MG 24 hr tablet Take 100 mg by mouth daily. Take with or immediately following a meal.   Yes Historical Provider, MD  niacin (NIASPAN) 1000 MG CR tablet Take 1,000 mg by mouth at bedtime.     Yes Historical Provider, MD  nitroGLYCERIN (NITROLINGUAL) 0.4 MG/SPRAY spray Place 1 spray under the tongue every 5 (five) minutes as needed. angina 07/19/14  Yes Arnoldo Lenis, MD  Omega-3 Fatty Acids (FISH OIL) 1200 MG CAPS Take 1,200 mg by mouth 2 (two) times daily.     Yes Historical Provider, MD  pantoprazole (PROTONIX) 40 MG tablet Take 1 tablet (40 mg total) by mouth daily. 07/19/14  Yes Arnoldo Lenis, MD  ranolazine (RANEXA) 1000 MG SR tablet Take 1 tablet (1,000 mg total) by mouth 2 (two) times daily. 05/13/14  Yes Arnoldo Lenis, MD  rosuvastatin (CRESTOR) 40 MG tablet Take 1 tablet (40 mg total) by mouth at bedtime. 09/27/14  Yes Arnoldo Lenis, MD  spironolactone (ALDACTONE) 25 MG tablet Take 12.5 mg by mouth daily.   Yes Historical Provider, MD  insulin aspart (NOVOLOG FLEXPEN) 100 UNIT/ML FlexPen Inject 6 Units into the skin 3 (three) times daily with meals. Patient not taking: Reported on 12/24/2014 11/20/14   Radene Gunning, NP  Insulin Glargine (LANTUS) 100 UNIT/ML Solostar Pen Inject 20 Units into the skin daily at 10 pm. Patient not taking: Reported on 12/24/2014 11/20/14   Radene Gunning, NP  metoprolol succinate (TOPROL-XL) 25 MG 24 hr tablet Take 1 tablet (25 mg total) by mouth 2 (two) times daily. Patient not taking: Reported on 12/24/2014 09/29/14   Erline Hau, MD  peg 3350 powder (MOVIPREP) 100 G SOLR Take 1 kit (200 g total) by mouth as directed. Patient not taking: Reported on 12/24/2014 11/12/14   Daneil Dolin, MD   Physical Exam: Filed Vitals:   12/24/14 0715 12/24/14 0730 12/24/14 0800 12/24/14 0912  BP:  107/58 115/72 112/65  Pulse: 72 72 72 73  Temp:    97.7 F (36.5 C)  TempSrc:    Oral  Resp: 15     Height:      Weight:      SpO2: 100% 100% 100% 100%    Wt Readings from Last 3 Encounters:  12/24/14 95.709 kg (211 lb)  12/19/14 95.709 kg (211 lb)  12/10/14 97.07 kg (214 lb)    General:  Appears calm and comfortable, appears dry Eyes: PERRL, normal lids, irises & conjunctiva ENT: grossly normal hearing, lips & tongue, mucous membranes of his mouth are pink but dry Neck: no LAD, masses or thyromegaly Cardiovascular: RRR, no m/r/g. No LE edema.  Respiratory: CTA bilaterally, no w/r/r. Normal respiratory effort. Abdomen: soft, ntnd, obese positive bowel sounds Skin: no rash or induration seen on limited exam Musculoskeletal: grossly normal tone BUE/BLE Psychiatric: grossly normal mood and affect, speech fluent and appropriate Neurologic: grossly non-focal.           Labs on Admission:  Basic Metabolic Panel:  Recent Labs Lab 12/24/14 0414  NA 132*  K 4.6  CL 101  CO2 20  GLUCOSE 206*  BUN 59*  CREATININE 5.69*  CALCIUM 9.1   Liver Function Tests:  Recent Labs Lab 12/24/14 0414  AST 24  ALT 18  ALKPHOS 77  BILITOT 0.8  PROT 7.6  ALBUMIN 3.2*   No results for input(s): LIPASE, AMYLASE in the last 168 hours. No results for input(s): AMMONIA in the last 168 hours. CBC:  Recent Labs Lab 12/24/14 0414  WBC 9.2  NEUTROABS 8.0*  HGB 12.1*  HCT 36.0*  MCV 88.2  PLT 171   Cardiac Enzymes:  Recent Labs Lab 12/24/14 0414  TROPONINI 0.03    BNP (last 3 results)  Recent Labs  07/18/14 0555 09/27/14 0523 11/19/14 0745  PROBNP 1116.0* 9164.0* 5233.0*   CBG:  Recent Labs Lab 12/24/14 0842  GLUCAP 175*    Radiological Exams on Admission: Dg Chest 2 View  12/24/2014   CLINICAL DATA:  Fall  EXAM: CHEST  2 VIEW  COMPARISON:  11/19/2014  FINDINGS: Biventricular ICD/pacer leads are in unchanged position. Unchanged cardiomegaly. Aortic and hilar contours are normal. Previously noted CHF has resolved. No pneumothorax. No evidence of fracture.  IMPRESSION: No active cardiopulmonary disease.   Electronically Signed   By: Jorje Guild M.D.   On: 12/24/2014 05:23   Dg Thoracic Spine 2 View  12/24/2014   CLINICAL DATA:  Fall  EXAM: THORACIC SPINE - 2 VIEW  COMPARISON:  Chest x-ray 05/19/2014  FINDINGS: There is no evidence of thoracic spine fracture or subluxation.  Chronic exaggerated thoracic kyphosis with diffuse spondylotic endplate spurring. No focally notable/advanced disc narrowing or endplate erosion.  IMPRESSION: No acute osseous finding.   Electronically Signed   By: Jorje Guild M.D.   On: 12/24/2014 05:26   Ct Head Wo Contrast  12/24/2014   CLINICAL DATA:  Dizziness and weakness for a few days.  EXAM: CT HEAD WITHOUT CONTRAST  TECHNIQUE: Contiguous axial images were obtained from the base of the skull  through the vertex without intravenous contrast.  COMPARISON:  CT of the head July 17, 2014  FINDINGS: The ventricles and sulci are normal for age. No intraparenchymal hemorrhage, mass effect nor midline shift. Mild supratentorial white matter hypodensities are within normal range for patient's age and though non-specific suggest sequelae of chronic small vessel ischemic disease. No acute large vascular territory infarcts.  Small amount of dense extra-axial blood products at the level of the quadrigeminal and ambient cistern (axial 11/36). Basal cisterns are patent. Severe calcific atherosclerosis of the carotid siphons and included vertebral arteries.  No skull fracture. The included ocular globes and orbital contents are non-suspicious. The mastoid aircells and included paranasal sinuses are well-aerated.  IMPRESSION: Small amount of blood products in the quadrigeminal/ambient cistern ; this can be seen with perimesencephalic subarachnoid hemorrhage (typically not aneurysmal).  Mild white matter changes can be seen with chronic small vessel ischemic disease.  Acute findings discussed with and reconfirmed by Dr.STEPHEN RANCOUR on 12/24/2014 at 5:19 am.   Electronically Signed   By: Elon Alas   On: 12/24/2014 05:20    EKG: Independently reviewed. Complete AV block   Assessment/Plan Principal Problem:   SAH (subarachnoid hemorrhage): Secondary to fall in setting of aspirin and Elaquis. Emergency department physician spoke with on-call neurology recommended repeat CT in 24 hours and stated continuing Elaquis ok. Currently neurological exam benign. No focal deficits. Monitor closely. Will hold his aspirin. Repeat cT in am.  Request PT to evaluate Active Problems: Acute on chronic kidney failure stage III. Likely related to  dehydration in the setting of decreased po intake, recent colonoscopy prep and  diuretics. Chart review indicates creatinine range 1.82. On admission his creatinine is 5.69. Will  hold any nephro toxins and very gently hydrate staying cognizant of his EF.    Dehydration: Related to decreased by mouth intake after recent colonoscopy prep and diuretics. Hold his home Aldactone and Lasix. Gently hydrate as noted above.    Diabetes mellitus, type II: Patient does not typically check his blood sugar or take his insulin as prescribed. I will obtain a hemoglobin A1c. Provide car modified diet. Will give him half of his Lantus dose and provide sliding scale for optimal control      PAF (paroxysmal atrial fibrillation): Rate controlled. Will continue Elaquis    Obesity: BMI 30.3        GERD (gastroesophageal reflux disease): Stable at baseline    CAD (coronary artery disease): No chest pain. Initial troponin negative.       Chronic systolic congestive heart failure: Recent echo in October 2956 reveals systolic function was moderately to severely reduced. The estimated ejection fraction was in the range of 30% to 35%. During his Lasix and spurring. Home for now do to above. Will very gently hydrate with IV fluids. Monitor intake and output. Obtain daily weights.    Code Status: full VT Prophylaxis: Family Communication: wife at bedside Disposition Plan: home when ready hopefully tomorrow  Time spent: 17 minutes  [facility] Pager [phone]

## 2014-12-24 NOTE — Progress Notes (Signed)
Robert Glad, NP and Dr. Jerilee Hoh made aware that patient's orthostatic VS were positive.  Received order to increase fluids to 132ml/hr for 8 hours, then back to 27ml/hr.  Will continue to monitor patient.

## 2014-12-25 ENCOUNTER — Inpatient Hospital Stay (HOSPITAL_COMMUNITY): Payer: Medicare Other

## 2014-12-25 DIAGNOSIS — N179 Acute kidney failure, unspecified: Secondary | ICD-10-CM | POA: Diagnosis present

## 2014-12-25 DIAGNOSIS — N189 Chronic kidney disease, unspecified: Secondary | ICD-10-CM

## 2014-12-25 LAB — BASIC METABOLIC PANEL
Anion gap: 8 (ref 5–15)
BUN: 57 mg/dL — AB (ref 6–23)
CHLORIDE: 105 meq/L (ref 96–112)
CO2: 22 mmol/L (ref 19–32)
Calcium: 8.9 mg/dL (ref 8.4–10.5)
Creatinine, Ser: 5.35 mg/dL — ABNORMAL HIGH (ref 0.50–1.35)
GFR calc non Af Amer: 11 mL/min — ABNORMAL LOW (ref >90)
GFR, EST AFRICAN AMERICAN: 12 mL/min — AB (ref >90)
Glucose, Bld: 139 mg/dL — ABNORMAL HIGH (ref 70–99)
Potassium: 4.1 mmol/L (ref 3.5–5.1)
Sodium: 135 mmol/L (ref 135–145)

## 2014-12-25 LAB — GLUCOSE, CAPILLARY
GLUCOSE-CAPILLARY: 145 mg/dL — AB (ref 70–99)
GLUCOSE-CAPILLARY: 180 mg/dL — AB (ref 70–99)
Glucose-Capillary: 123 mg/dL — ABNORMAL HIGH (ref 70–99)
Glucose-Capillary: 188 mg/dL — ABNORMAL HIGH (ref 70–99)

## 2014-12-25 LAB — CBC
HEMATOCRIT: 32.9 % — AB (ref 39.0–52.0)
Hemoglobin: 11.2 g/dL — ABNORMAL LOW (ref 13.0–17.0)
MCH: 30.4 pg (ref 26.0–34.0)
MCHC: 34 g/dL (ref 30.0–36.0)
MCV: 89.2 fL (ref 78.0–100.0)
Platelets: 156 10*3/uL (ref 150–400)
RBC: 3.69 MIL/uL — AB (ref 4.22–5.81)
RDW: 13.9 % (ref 11.5–15.5)
WBC: 8.5 10*3/uL (ref 4.0–10.5)

## 2014-12-25 MED ORDER — SIMETHICONE 40 MG/0.6ML PO SUSP
ORAL | Status: AC
  Filled 2014-12-25: qty 0.6

## 2014-12-25 MED ORDER — SODIUM CHLORIDE 0.9 % IV SOLN
INTRAVENOUS | Status: AC
  Administered 2014-12-25: 10:00:00 via INTRAVENOUS

## 2014-12-25 MED ORDER — BUTAMBEN-TETRACAINE-BENZOCAINE 2-2-14 % EX AERO
INHALATION_SPRAY | CUTANEOUS | Status: AC
  Filled 2014-12-25: qty 56

## 2014-12-25 NOTE — Care Management Note (Addendum)
    Page 1 of 2   01/07/2015     7:31:46 AM CARE MANAGEMENT NOTE 01/07/2015  [name]:  [name], [name]   Account Number:  [id number]  Date Initiated:  12/25/2014  Documentation initiated by:  Theophilus Kinds  Subjective/Objective Assessment:   Pt admitted from home with dehydration and ARF. Pt lives with his wife and will return home at discharge. Pt is independent with ADl's.     Action/Plan:   PT recommends outpt PT with [facility] PT dept. Pt is agreeable. CM will arrange prior to discharge.   Anticipated DC Date:  12/27/2014   Anticipated DC Plan:  [place]  CM consult      PAC Choice  [place]   Choice offered to / List presented to:  [name]   DME arranged  Vassie Moselle      DME agency  [place] arranged  [employer].   Status of service:  Completed, signed off Medicare Important Message given?  YES (If response is "NO", the following Medicare IM given date fields will be blank) Date Medicare IM given:  12/27/2014 Medicare IM given by:  Christinia Gully C Date Additional Medicare IM given:  01/06/2015 Additional Medicare IM given by:  [name] CHILDRESS  Discharge Disposition:  [place]  Per UR Regulation:    If discussed at Long Length of Stay Meetings, dates discussed:   12/31/2014  01/02/2015    Comments:  01/06/2015 [place], RN, MSN, CM Pt plans to discharge home today with [facility] servics through [facility]. Pt does not need home O2 at discharge. MD would like pt to use CPAP at home. Pt has used CPAP in past but will require new SS before one can be sent from [facility]. Referral for SS faxed to sleep center. Sleep center to contact pt for appointment. no further CM needs at the time of discharge.  01/03/15 [place], RN BSN CM PT recommends HH PT vs outpt PT. Pt and pts wife would like HH PT. Pt choses [facility].  Weekend staff to call and fax [place] orders when written. PT also recommends rolling walker and pt choses [facility]. Emma with [facility] will deliver walker to pts room prior to discharge. Pt and pts nurse aware.  01/03/15 [place], RN BSN CM Pt now more alert and communicative. PT to reassess pt for discharge needs. ? [facility] PT instead of SNF.   01/02/15 [place], RN BSN CM Pt now very confused and requiring Air cabin crew. Neurology consulting. Will continue to follow. PT is recommending SNF and CSW is aware.  12/27/14 [place], RN BSN CM Anticipate discharge within 24 hours. Outpt PT form faxed to [facility] outpt PT dept. They will call pt with appt times. Pt made aware. No CM needs noted. 12/25/14 [place], RN BSN CM

## 2014-12-25 NOTE — Progress Notes (Signed)
[facility] PROGRESS NOTE  [name] TDV:[id number] DOB: 06-09-1956 DOA: 12/24/2014 PCP: Carlyle Dolly, F, MD  Assessment/Plan: SAH (subarachnoid hemorrhage): Secondary to fall in setting of aspirin and Elaquis. Emergency department physician spoke with on-call neurology recommended repeat CT in 24 hours and stated continuing Elaquis ok. Neurologic exam remains benign. No focal deficits. Await repeat CT results. Await PT to evaluate Active Problems: Acute on chronic kidney failure stage III. Likely related to dehydration in the setting of decreased po intake, recent colonoscopy prep and diuretics. Chart review indicates creatinine range 1.82. No improvement this am in spite of IV fluids. Patient continues to appear dry. Will resume IV fluids. Continue to hold diuretics. Urine output good   Dehydration: see above. Hold his home Aldactone and Lasix. Gently hydrate as noted above.   Diabetes mellitus, type II: Hemoglobin A1c 10.6. CBG range 123-139.  continue carb modified diet. Continue  half of his Lantus dose and provide sliding scale for optimal control    PAF (paroxysmal atrial fibrillation): Rate controlled. Will continue Elaquis   Obesity: BMI 30.3    GERD (gastroesophageal reflux disease): Stable at baseline   CAD (coronary artery disease): No chest pain. Initial troponin negative.     Chronic systolic congestive heart failure: Recent echo in October 0626 reveals systolic function was moderately to severely reduced. The estimated ejection fraction was in the range of 30% to 35%. During his Lasix and spurring. Home for now do to above. Will very gently hydrate with IV fluids. Monitor intake and output. Obtain daily weights.    Code Status: full Family Communication: none present Disposition Plan: home hopefully tomorrow   Consultants:  none  Procedures:  none  Antibiotics: none HPI/Subjective: Reports feeling "better". Complains dry  mouth. Reports improved urine output  Objective: Filed Vitals:   12/25/14 0846  BP:   Pulse: 68  Temp:   Resp: 17    Intake/Output Summary (Last 24 hours) at 12/25/14 0936 Last data filed at 12/25/14 0449  Gross per 24 hour  Intake 1487.5 ml  Output   1950 ml  Net -462.5 ml   Filed Weights   12/24/14 0359 12/25/14 0444  Weight: 95.709 kg (211 lb) 95.255 kg (210 lb)    Exam:   General:  Obese appears comfortable  Cardiovascular: RRR no MGR No LE edema  Respiratory: normal effort BS clear bilaterally no wheeze  Abdomen: obese soft +BS non-tender  Musculoskeletal: joints without swelling/erythema    Data Reviewed: Basic Metabolic Panel:  Recent Labs Lab 12/24/14 0414 12/25/14 0540  NA 132* 135  K 4.6 4.1  CL 101 105  CO2 20 22  GLUCOSE 206* 139*  BUN 59* 57*  CREATININE 5.69* 5.35*  CALCIUM 9.1 8.9   Liver Function Tests:  Recent Labs Lab 12/24/14 0414  AST 24  ALT 18  ALKPHOS 77  BILITOT 0.8  PROT 7.6  ALBUMIN 3.2*   No results for input(s): LIPASE, AMYLASE in the last 168 hours. No results for input(s): AMMONIA in the last 168 hours. CBC:  Recent Labs Lab 12/24/14 0414 12/25/14 0540  WBC 9.2 8.5  NEUTROABS 8.0*  --   HGB 12.1* 11.2*  HCT 36.0* 32.9*  MCV 88.2 89.2  PLT 171 156   Cardiac Enzymes:  Recent Labs Lab 12/24/14 0414 12/24/14 1212 12/24/14 1843  TROPONINI 0.03 0.03 0.03   BNP (last 3 results)  Recent Labs  07/18/14 0555 09/27/14 0523 11/19/14 0745  PROBNP 1116.0* 9164.0* 5233.0*   CBG:  Recent  Labs Lab 12/24/14 0842 12/24/14 1157 12/24/14 1637 12/24/14 2117 12/25/14 0747  GLUCAP 175* 139* 217* 165* 123*    No results found for this or any previous visit (from the past 240 hour(s)).   Studies: Dg Chest 2 View  12/24/2014   CLINICAL DATA:  Fall  EXAM: CHEST  2 VIEW  COMPARISON:  11/19/2014  FINDINGS: Biventricular ICD/pacer leads are in unchanged position. Unchanged cardiomegaly. Aortic and hilar  contours are normal. Previously noted CHF has resolved. No pneumothorax. No evidence of fracture.  IMPRESSION: No active cardiopulmonary disease.   Electronically Signed   By: Jorje Guild M.D.   On: 12/24/2014 05:23   Dg Thoracic Spine 2 View  12/24/2014   CLINICAL DATA:  Fall  EXAM: THORACIC SPINE - 2 VIEW  COMPARISON:  Chest x-ray 05/19/2014  FINDINGS: There is no evidence of thoracic spine fracture or subluxation.  Chronic exaggerated thoracic kyphosis with diffuse spondylotic endplate spurring. No focally notable/advanced disc narrowing or endplate erosion.  IMPRESSION: No acute osseous finding.   Electronically Signed   By: Jorje Guild M.D.   On: 12/24/2014 05:26   Ct Head Wo Contrast  12/24/2014   CLINICAL DATA:  Dizziness and weakness for a few days.  EXAM: CT HEAD WITHOUT CONTRAST  TECHNIQUE: Contiguous axial images were obtained from the base of the skull through the vertex without intravenous contrast.  COMPARISON:  CT of the head July 17, 2014  FINDINGS: The ventricles and sulci are normal for age. No intraparenchymal hemorrhage, mass effect nor midline shift. Mild supratentorial white matter hypodensities are within normal range for patient's age and though non-specific suggest sequelae of chronic small vessel ischemic disease. No acute large vascular territory infarcts.  Small amount of dense extra-axial blood products at the level of the quadrigeminal and ambient cistern (axial 11/36). Basal cisterns are patent. Severe calcific atherosclerosis of the carotid siphons and included vertebral arteries.  No skull fracture. The included ocular globes and orbital contents are non-suspicious. The mastoid aircells and included paranasal sinuses are well-aerated.  IMPRESSION: Small amount of blood products in the quadrigeminal/ambient cistern ; this can be seen with perimesencephalic subarachnoid hemorrhage (typically not aneurysmal).  Mild white matter changes can be seen with chronic small  vessel ischemic disease.  Acute findings discussed with and reconfirmed by Dr.STEPHEN RANCOUR on 12/24/2014 at 5:19 am.   Electronically Signed   By: Elon Alas   On: 12/24/2014 05:20    Scheduled Meds: . amiodarone  200 mg Oral Daily  . citalopram  20 mg Oral Daily  . digoxin  0.125 mg Oral QODAY  . feeding supplement (GLUCERNA SHAKE)  237 mL Oral BID BM  . insulin aspart  0-15 Units Subcutaneous TID WC  . insulin aspart  0-5 Units Subcutaneous QHS  . insulin glargine  12 Units Subcutaneous QHS  . pantoprazole  40 mg Oral Daily  . ranolazine  1,000 mg Oral BID  . rosuvastatin  40 mg Oral QHS  . simethicone       Continuous Infusions: . sodium chloride      Principal Problem:   SAH (subarachnoid hemorrhage) Active Problems:   Diabetes mellitus, type II   Essential hypertension   PAF (paroxysmal atrial fibrillation)   Obesity   Acute-on-chronic renal failure   DM type 2, uncontrolled, with renal complications   GERD (gastroesophageal reflux disease)   CAD (coronary artery disease)   Acute renal failure   Dehydration   Chronic systolic congestive heart failure  Time spent: 35 minutes    [facility] Pager [phone]. If 7PM-7AM, please contact night-coverage at [email or web address], password [facility] 12/25/2014, 9:36 AM  LOS: 1 day

## 2014-12-25 NOTE — Evaluation (Signed)
Physical Therapy Evaluation Patient Details Name: GIACOMO VALONE MRN: [id number] DOB: [birth year] Today's Date: 12/25/2014   History of Present Illness  ARHAN MCMANAMON is a 59 y.o. male with a past medical history that includes ischemic cardiomyopathy with an EF 15% with AICD in place, diabetes, CAD status post stents, A. fib on requests, chronic kidney disease stage III, since to the emergency department after a fall. Initial evaluation reveals a subarachnoid hemorrhage acute on chronic kidney failure and dehydration.  Patient reports approximately 10 days ago he underwent a routine colonoscopy and EGD for screening purposes. He reports since that time his appetite has waxed and waned. He reports last evening his legs "just gave out". He denies losing consciousness but does report hitting his head causing subarachnoid hemorrhage. He denies any chest pain palpitation headache visual disturbances. He denies any fever chills recent illness. Denies any abdominal pain nausea vomiting diarrhea.  Clinical Impression  Pt is a 59 year old male who sustained a fall resulting in subarachnoid hemorrhage.  Pt reports some falls recently secondary to cardiac problems or dehydration; pt reports cardiologist visit next week to assess as pt reports pain in back of legs when walking up hills.  Noted some balance issues initially with sitting at EOB and standing, though on second attempt no difficulties noted.  Pt was able to amb and ascend/descend stairs without physical assist or use of AD.  Educated pt after cardiologist visit he would benefit from OPPT services to increase balance and functional mobility skills, and possibility need for std cane if procedure is done by cardiologist.  No further PT services needed at this time.     Follow Up Recommendations Outpatient PT    Equipment Recommendations   None recommended.        Precautions / Restrictions Precautions Precautions: Fall Restrictions Weight Bearing  Restrictions: No      Mobility  Bed Mobility Overal bed mobility: Modified Independent             General bed mobility comments: Use of bed rails  Transfers Overall transfer level: Needs assistance Equipment used: None Transfers: Sit to/from Stand Sit to Stand: Supervision            Ambulation/Gait Ambulation/Gait assistance: Modified independent (Device/Increase time);Supervision Ambulation Distance (Feet): 300 Feet Assistive device: None Gait Pattern/deviations: Step-through pattern;Decreased stride length   Gait velocity interpretation: at or above normal speed for age/gender    Stairs Stairs: Yes Stairs assistance: Min guard Stair Management: One rail Right Number of Stairs: 3 General stair comments: Step through gait pattern to ascend, step through to descend though increased effort to complete as eccentric control was decreased     Balance Overall balance assessment: History of Falls;Needs assistance                                           Pertinent Vitals/Pain Pain Assessment: 0-10 Pain Score: 8  Pain Location: Chest pain from fall a couple days ago Pain Descriptors / Indicators: Tender Pain Intervention(s): Limited activity within patient's tolerance;Monitored during session;Repositioned    Home Living Family/patient expects to be discharged to:: Private residence Living Arrangements: Spouse/significant other Available Help at Discharge: Family;Available 24 hours/day Type of Home: Mobile home [facility]) Home Access: Stairs to enter Entrance Stairs-Rails: Can reach both Entrance Stairs-Number of Steps: 1 Home Layout: One level Home Equipment: None  Prior Function Level of Independence: Independent                  Extremity/Trunk Assessment               Lower Extremity Assessment: Generalized weakness         Communication   Communication: No difficulties  Cognition Arousal/Alertness:  Awake/alert Behavior During Therapy: WFL for tasks assessed/performed Overall Cognitive Status: Within Functional Limits for tasks assessed                        Assessment/Plan    PT Assessment All further PT needs can be met in the next venue of care  PT Diagnosis Generalized weakness   PT Problem List Decreased activity tolerance;Decreased mobility  PT Treatment Interventions     PT Goals (Current goals can be found in the Care Plan section) Acute Rehab PT Goals PT Goal Formulation: All assessment and education complete, DC therapy     End of Session Equipment Utilized During Treatment: Gait belt Activity Tolerance: Patient tolerated treatment well Patient left: in chair;with call bell/phone within reach;with chair alarm set           Time: 1138-1200 PT Time Calculation (min) (ACUTE ONLY): 22 min   Charges:   PT Evaluation $Initial PT Evaluation Tier I: 1 Procedure     Lonna Cobb, DPT [phone]   12/25/2014, 12:49 PM

## 2014-12-25 NOTE — Progress Notes (Signed)
UR chart review completed.  

## 2014-12-26 ENCOUNTER — Inpatient Hospital Stay (HOSPITAL_COMMUNITY): Payer: Medicare Other

## 2014-12-26 DIAGNOSIS — R1084 Generalized abdominal pain: Secondary | ICD-10-CM | POA: Diagnosis present

## 2014-12-26 DIAGNOSIS — I10 Essential (primary) hypertension: Secondary | ICD-10-CM

## 2014-12-26 LAB — BASIC METABOLIC PANEL
Anion gap: 8 (ref 5–15)
BUN: 57 mg/dL — ABNORMAL HIGH (ref 6–23)
CO2: 21 mmol/L (ref 19–32)
CREATININE: 5 mg/dL — AB (ref 0.50–1.35)
Calcium: 8.7 mg/dL (ref 8.4–10.5)
Chloride: 104 mEq/L (ref 96–112)
GFR calc Af Amer: 13 mL/min — ABNORMAL LOW (ref >90)
GFR, EST NON AFRICAN AMERICAN: 11 mL/min — AB (ref >90)
Glucose, Bld: 134 mg/dL — ABNORMAL HIGH (ref 70–99)
Potassium: 4.3 mmol/L (ref 3.5–5.1)
Sodium: 133 mmol/L — ABNORMAL LOW (ref 135–145)

## 2014-12-26 LAB — GLUCOSE, CAPILLARY
GLUCOSE-CAPILLARY: 126 mg/dL — AB (ref 70–99)
GLUCOSE-CAPILLARY: 175 mg/dL — AB (ref 70–99)
GLUCOSE-CAPILLARY: 95 mg/dL (ref 70–99)
Glucose-Capillary: 136 mg/dL — ABNORMAL HIGH (ref 70–99)

## 2014-12-26 LAB — URINALYSIS, ROUTINE W REFLEX MICROSCOPIC
BILIRUBIN URINE: NEGATIVE
Glucose, UA: 100 mg/dL — AB
Ketones, ur: NEGATIVE mg/dL
Leukocytes, UA: NEGATIVE
Nitrite: NEGATIVE
Protein, ur: NEGATIVE mg/dL
Specific Gravity, Urine: 1.01 (ref 1.005–1.030)
Urobilinogen, UA: 0.2 mg/dL (ref 0.0–1.0)
pH: 5.5 (ref 5.0–8.0)

## 2014-12-26 LAB — URINE MICROSCOPIC-ADD ON

## 2014-12-26 MED ORDER — APIXABAN 5 MG PO TABS: 5.0000 mg | ORAL_TABLET | Freq: Two times a day (BID) | ORAL | Status: DC

## 2014-12-26 MED ORDER — LACTULOSE 10 GM/15ML PO SOLN
30.0000 g | ORAL | Status: AC
  Administered 2014-12-26: 30 g via ORAL
  Filled 2014-12-26: qty 60

## 2014-12-26 MED ORDER — SODIUM CHLORIDE 0.9 % IV SOLN
INTRAVENOUS | Status: DC
  Administered 2014-12-26 – 2014-12-28 (×3): via INTRAVENOUS

## 2014-12-26 MED ORDER — MAGNESIUM HYDROXIDE 400 MG/5ML PO SUSP: 30.0000 mL | Freq: Once | ORAL | Status: DC

## 2014-12-26 MED ORDER — DOCUSATE SODIUM 100 MG PO CAPS
100.0000 mg | ORAL_CAPSULE | Freq: Two times a day (BID) | ORAL | Status: DC
  Administered 2014-12-26 – 2015-01-06 (×22): 100 mg via ORAL
  Filled 2014-12-26 (×23): qty 1

## 2014-12-26 NOTE — Consult Note (Signed)
Reason for Consult:AKI superimposed on chronic renal failure Referring Physician: Dr.Chiu  [name] is an 59 y.o. male.  HPI: He is a patient with history of hypertension, type 2 diabetes, stage III rectal CA status post radiation/chemotherapy and history of prostate cancer presently came with complaints of weakness and falling down. According to patient he had colonoscopy was 3 weeks ago since then his appetite was poor, with some nausea but no vomiting. During that time his urine output has declined. When patient was evaluated he was found to have [place] and creatinine of 5.35. Patient presently denies any previous history of renal failure or kidney stone. However looking at his previous blood work patient seems to have underlying chronic renal failure. Presently patient states that he is feeling much better. He denies any nausea no vomiting. His urine output is improving.  Past Medical History  Diagnosis Date  . Essential hypertension, benign   . Type 2 diabetes mellitus   . CAD (coronary artery disease)     a. BMS to LAD 2001 at [facility] b. PTCA/atherectomy ramus and BMS to LAD 2009  . Chronic systolic heart failure     a. 2D ECHO: 03/01/2014 EF 15-20%, severe, diffuse hypsokinesis. Mod LV dilation. Mod LVH, indeterminate diastolic dysfxn (poor study), high vent filling pressures, abnormal septal wall motion and dyssynergy c/w RV pacing, mild MR, mod LA dilation, RV systolic function mod reduced, mild TR, PA pressure 42.   Marland Kitchen GERD (gastroesophageal reflux disease)   . Cardiomyopathy, ischemic     a. BIV ICD St. Jude, LVEF 23%  . Paroxysmal atrial fibrillation     a. on amiodarone, digoxin and Eliquis  . Adenocarcinoma of rectum     a. 2008  . Prostate cancer     a. s/p seed implants with chemo and radiation  . 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 St Jude  . TIA (transient ischemic attack)   . OSA on CPAP   . HLD (hyperlipidemia)   . Orthostatic hypotension    . Dizziness     a. chronic. Admission for this 07/18/2014  . History of falling July 2015    due to dizziness from medications  . CHF (congestive heart failure)   . Renal insufficiency     Past Surgical History  Procedure Laterality Date  . Internal defibrillator and pacemaker  2010    x2 St. Jude device  . Abdominal and perineal resection of rectum with total mesorectal excision  10/04/2007  . Colonoscopy      05/17/2007. IMPRESSION: Semilunar, apple-core neoplasm low in the rectum (palpable on digital rectal exam) beginning at 5 cm and corkscrewing up 5 cm in length. This was a low rectal lesion consistent with colorectal carcinoma. It was biosied multiple times. The upstream colon all the way to the cecum appeared normal. Recommendations: Followup on path. Surgical Consultation   . Colonoscopy  09/14/2011    Dr. Gala Romney: via colostomy, Single pedunculated benign inflammatory polyp. Due for surveillance Oct 2015  . Colostomy    . Colonoscopy N/A 07/02/2014    Procedure: COLONOSCOPY;  Surgeon: Daneil Dolin, MD;  [facility];  Service: Endoscopy;  Laterality: N/A;  7:30 / COLONOSCOPY THRU COLOSTOMY  . Esophagogastroduodenoscopy N/A 07/02/2014    Procedure: ESOPHAGOGASTRODUODENOSCOPY (EGD);  Surgeon: Daneil Dolin, MD;  Location: [facility];  Service: Endoscopy;  Laterality: N/A;  7:30  . Savory dilation N/A 07/02/2014    Procedure: SAVORY DILATION;  Surgeon:  Corbin Ade, MD;  Location: [facility];  Service: Endoscopy;  Laterality: N/A;  7:30  . Maloney dilation N/A 07/02/2014    Procedure: Elease Hashimoto DILATION;  Surgeon: Corbin Ade, MD;  Location: [facility];  Service: Endoscopy;  Laterality: N/A;  7:30  . Portacath placement  06/2007    "removed ~ 1 yr later"  . Cardiac catheterization  08/2001  . Coronary angioplasty with stent placement  2001; ~ 2006    "1 + 1"   . Left heart catheterization with coronary angiogram N/A 07/13/2013    Procedure: LEFT HEART  CATHETERIZATION WITH CORONARY ANGIOGRAM;  Surgeon: [name], MD;  Location: [facility] CATH LAB;  Service: Cardiovascular;  Laterality: N/A;  . Colonoscopy N/A 12/11/2014    Procedure: COLONOSCOPY VIA COLOSTOMY;  Surgeon: Corbin Ade, MD;  [facility]: [facility];  Service: Endoscopy;  Laterality: N/A;  730   . Esophagogastroduodenoscopy N/A 12/11/2014    Procedure: ESOPHAGOGASTRODUODENOSCOPY (EGD);  Surgeon: Corbin Ade, MD;  Location: [facility];  Service: Endoscopy;  Laterality: N/A;    Family History  Problem Relation Age of Onset  . Colon cancer Mother 58  . Colon cancer Sister 105  . Coronary artery disease Father   . Colon cancer Other     2 cousins, succumbed to illness    Social History:  reports that he has never smoked. He has never used smokeless tobacco. He reports that he does not drink alcohol or use illicit drugs.  Allergies: No Known Allergies  Medications: I have reviewed the patient's current medications.  Results for orders placed or performed during the hospital encounter of 12/24/14 (from the past 48 hour(s))  Glucose, capillary     Status: Abnormal   Collection Time: 12/24/14 11:57 AM  Result Value Ref Range   Glucose-Capillary 139 (H) 70 - 99 mg/dL  Troponin I     Status: None   Collection Time: 12/24/14 12:12 PM  Result Value Ref Range   Troponin I 0.03 <0.031 ng/mL    Comment:        NO INDICATION OF MYOCARDIAL INJURY. Please note change in reference range.   Glucose, capillary     Status: Abnormal   Collection Time: 12/24/14  4:37 PM  Result Value Ref Range   Glucose-Capillary 217 (H) 70 - 99 mg/dL   Comment 1 Notify RN   Troponin I     Status: None   Collection Time: 12/24/14  6:43 PM  Result Value Ref Range   Troponin I 0.03 <0.031 ng/mL    Comment:        NO INDICATION OF MYOCARDIAL INJURY. Please note change in reference range.   Glucose, capillary     Status: Abnormal   Collection Time: 12/24/14  9:17 PM  Result Value Ref Range    Glucose-Capillary 165 (H) 70 - 99 mg/dL   Comment 1 Notify RN    Comment 2 Documented in Chart   CBC     Status: Abnormal   Collection Time: 12/25/14  5:40 AM  Result Value Ref Range   WBC 8.5 4.0 - 10.5 K/uL   RBC 3.69 (L) 4.22 - 5.81 MIL/uL   Hemoglobin 11.2 (L) 13.0 - 17.0 g/dL   HCT 27.6 (L) 97.0 - 44.4 %   MCV 89.2 78.0 - 100.0 fL   MCH 30.4 26.0 - 34.0 pg   MCHC 34.0 30.0 - 36.0 g/dL   RDW 48.3 12.7 - 97.5 %   Platelets 156  150 - 400 K/uL  Basic metabolic panel     Status: Abnormal   Collection Time: 12/25/14  5:40 AM  Result Value Ref Range   Sodium 135 135 - 145 mmol/L    Comment: Please note change in reference range.   Potassium 4.1 3.5 - 5.1 mmol/L    Comment: Please note change in reference range.   Chloride 105 96 - 112 mEq/L   CO2 22 19 - 32 mmol/L   Glucose, Bld 139 (H) 70 - 99 mg/dL   BUN 57 (H) 6 - 23 mg/dL   Creatinine, Ser 5.35 (H) 0.50 - 1.35 mg/dL   Calcium 8.9 8.4 - 10.5 mg/dL   GFR calc non Af Amer 11 (L) >90 mL/min   GFR calc Af Amer 12 (L) >90 mL/min    Comment: (NOTE) The eGFR has been calculated using the CKD EPI equation. This calculation has not been validated in all clinical situations. eGFR's persistently <90 mL/min signify possible Chronic Kidney Disease.    Anion gap 8 5 - 15  Glucose, capillary     Status: Abnormal   Collection Time: 12/25/14  7:47 AM  Result Value Ref Range   Glucose-Capillary 123 (H) 70 - 99 mg/dL   Comment 1 Notify RN   Glucose, capillary     Status: Abnormal   Collection Time: 12/25/14 12:20 PM  Result Value Ref Range   Glucose-Capillary 145 (H) 70 - 99 mg/dL   Comment 1 Notify RN   Glucose, capillary     Status: Abnormal   Collection Time: 12/25/14  4:30 PM  Result Value Ref Range   Glucose-Capillary 188 (H) 70 - 99 mg/dL   Comment 1 Notify RN   Glucose, capillary     Status: Abnormal   Collection Time: 12/25/14  8:37 PM  Result Value Ref Range   Glucose-Capillary 180 (H) 70 - 99 mg/dL  Basic metabolic  panel     Status: Abnormal   Collection Time: 12/26/14  5:28 AM  Result Value Ref Range   Sodium 133 (L) 135 - 145 mmol/L    Comment: Please note change in reference range.   Potassium 4.3 3.5 - 5.1 mmol/L    Comment: Please note change in reference range.   Chloride 104 96 - 112 mEq/L   CO2 21 19 - 32 mmol/L   Glucose, Bld 134 (H) 70 - 99 mg/dL   BUN 57 (H) 6 - 23 mg/dL   Creatinine, Ser 5.00 (H) 0.50 - 1.35 mg/dL   Calcium 8.7 8.4 - 10.5 mg/dL   GFR calc non Af Amer 11 (L) >90 mL/min   GFR calc Af Amer 13 (L) >90 mL/min    Comment: (NOTE) The eGFR has been calculated using the CKD EPI equation. This calculation has not been validated in all clinical situations. eGFR's persistently <90 mL/min signify possible Chronic Kidney Disease.    Anion gap 8 5 - 15  Glucose, capillary     Status: Abnormal   Collection Time: 12/26/14  7:45 AM  Result Value Ref Range   Glucose-Capillary 126 (H) 70 - 99 mg/dL   Comment 1 Notify RN     Ct Head Wo Contrast  12/25/2014   CLINICAL DATA:  Subsequent evaluation of subarachnoid hemorrhage, with headache and dizziness  EXAM: CT HEAD WITHOUT CONTRAST  TECHNIQUE: Contiguous axial images were obtained from the base of the skull through the vertex without intravenous contrast.  COMPARISON:  December 24, 1914  FINDINGS: Calvarium is intact.  Visualized sinuses clear.  No evidence of parenchymal hemorrhage. No evidence of mass or vascular territory infarct. No hydrocephalus. Vertebrobasilar calcification again noted. Again identified is hyperattenuation in the quadrigeminal and ambient cisterns. When compared to prior study there is no interval change.  IMPRESSION: No change when compared to prior study with small volume extra-axial fluid again seen in the quadrigeminal and ambient cisterns.   Electronically Signed   By: Skipper Cliche M.D.   On: 12/25/2014 11:40    Review of Systems  Constitutional: Positive for malaise/fatigue.  Respiratory: Negative for  shortness of breath.   Cardiovascular: Negative for chest pain, orthopnea and leg swelling.  Gastrointestinal: Negative for nausea and vomiting.  Musculoskeletal: Positive for falls.  Neurological: Negative for weakness.   Blood pressure 116/70, pulse 91, temperature 97.5 F (36.4 C), temperature source Oral, resp. rate 18, height $RemoveBe'5\' 10"'[id number]$  (1.778 m), weight 96 kg (211 lb 10.3 oz), SpO2 100 %. Physical Exam  Constitutional: He is oriented to person, place, and time. He appears distressed.  Eyes: No scleral icterus.  Neck: No JVD present.  Cardiovascular: Normal rate.   No murmur heard. Respiratory: He has no wheezes. He has no rales.  GI: He exhibits distension. There is no tenderness. There is no rebound.  Musculoskeletal: He exhibits no edema.  Neurological: He is alert and oriented to person, place, and time.    Assessment/Plan: Problem #1 acute kidney injury: Most likely prerenal/cardiorenal/ ATN. Presently his BUN and creatinine seems to be improving. Patient does not have any nausea or vomiting. His appetite is is good. Problem #2 chronic renal failure: Patient creatinine on 09/22/2012 was 1.21, increased to 1.51 on 10/25/2013. About one month ago his creatinine was 1.88 with a GFR of 32 mL/m he is stage III chronic renal failure. This could be secondary to diabetes/hypertension/recurrent acute kidney injury/cardiorenal and possibly chemotherapy. His ultrasound from 02/21/2014 showed his right kidney to be 9.9 cm and left kidney 9.2. Problem #3 history of ischemic cardiomyopathy with severe systolic dysfunction and ejection fraction of 15-20%. Presently patient denies any difficulty breathing. No sign of fluid overload. Problem #4 hypertension: His blood pressure is reasonably controlled Problem #6 history of rectal cancer: Status post surgery/radiation/chemotherapy Problem #7 history of prostate cancer Problem #8 status post history of fall with subarachnoid hemorrhage. Problem #9  history of atrial fibrillation: His heart rate is controlled Plan: We'll start patient on normal saline at 100 mL per hour We'll follow his input and output. Once patient is adequate we'll consider starting him on diuretics. We'll check his basic metabolic panel, phosphorus, vitamin D in the morning. We'll check his UA.  Damyn Weitzel S 12/26/2014, 9:02 AM

## 2014-12-26 NOTE — Progress Notes (Signed)
[facility] PROGRESS NOTE  [name] VPX:[id number] DOB: 12-Feb-1956 DOA: 12/24/2014 PCP: Carlyle Dolly, F, MD  Assessment/Plan: SAH (subarachnoid hemorrhage): Secondary to fall in setting of aspirin and Elaquis. Eliquis on hold.  Repeat CT stable with no changes.  Neurologic exam remains benign. No focal deficits.  PT evaluated and recommended OP Pt. Await neurosurgery input on restarting Eliquis.  Active Problems: Acute on chronic kidney failure stage III. Likely related to dehydration in the setting of decreased po intake, recent colonoscopy prep and diuretics. Chart review indicates creatinine range 1.82. Very little improvement in spite of IV fluids. Evaluated by nephrology who opine most likely prerenal/cardiorenal/ATN. Recommending continuation of iv fluids and will consider starting diuretics.   Abdominal pain: Large amount of stool in colon per xray. Reports nothing in colostomy bag for "several" days. Will give MOM and start softener as well. If no results consider mag citrate and/or enema   Dehydration: see above. Holding his home Aldactone and Lasix. Gently hydrate as noted above.   Diabetes mellitus, type II: Hemoglobin A1c 10.6. Fair control. Continue half of his Lantus dose and provide sliding scale for optimal control.    PAF (paroxysmal atrial fibrillation): Rate controlled. Await neurosurgery recommendations regarding resumption of  Elaquis.   Obesity: BMI 30.3 nutritional consult    GERD (gastroesophageal reflux disease): Stable at baseline   CAD (coronary artery disease): No chest pain. Initial troponin negative.     Chronic systolic congestive heart failure: Recent echo in October 4627 reveals systolic function was moderately to severely reduced. The estimated ejection fraction was in the range of 30% to 35%. Holding diuretics for now as noted.  Will very gently hydrate with IV fluids. Monitor intake and output. Weight stable.    Code  Status: full Family Communication: none present Disposition Plan: home hopefully tomorrow   Consultants:  Nephrology  Neurosurgery (phone)  Procedures:  none  Antibiotics:  none  HPI/Subjective: Complains abdominal pain/distendtion and feeling "constipated". Denies nausea.   Objective: Filed Vitals:   12/26/14 0706  BP: 116/70  Pulse: 91  Temp: 97.5 F (36.4 C)  Resp: 18    Intake/Output Summary (Last 24 hours) at 12/26/14 1046 Last data filed at 12/26/14 0707  Gross per 24 hour  Intake    360 ml  Output   1300 ml  Net   -940 ml   Filed Weights   12/24/14 0359 12/25/14 0444 12/26/14 0706  Weight: 95.709 kg (211 lb) 95.255 kg (210 lb) 96 kg (211 lb 10.3 oz)    Exam:   General:  Well nourished NSD  Cardiovascular: RRR No MGR No LE edema  Respiratory: normal effort BS clear bilaterally no wheeze  Abdomen: slightly distended and slightly firm, sluggish BS. Colostomy bag intact.   Musculoskeletal: joints without swelling/erythema   Data Reviewed: Basic Metabolic Panel:  Recent Labs Lab 12/24/14 0414 12/25/14 0540 12/26/14 0528  NA 132* 135 133*  K 4.6 4.1 4.3  CL 101 105 104  CO2 20 22 21   GLUCOSE 206* 139* 134*  BUN 59* 57* 57*  CREATININE 5.69* 5.35* 5.00*  CALCIUM 9.1 8.9 8.7   Liver Function Tests:  Recent Labs Lab 12/24/14 0414  AST 24  ALT 18  ALKPHOS 77  BILITOT 0.8  PROT 7.6  ALBUMIN 3.2*   No results for input(s): LIPASE, AMYLASE in the last 168 hours. No results for input(s): AMMONIA in the last 168 hours. CBC:  Recent Labs Lab 12/24/14 0414 12/25/14 0540  WBC 9.2 8.5  NEUTROABS 8.0*  --   HGB 12.1* 11.2*  HCT 36.0* 32.9*  MCV 88.2 89.2  PLT 171 156   Cardiac Enzymes:  Recent Labs Lab 12/24/14 0414 12/24/14 1212 12/24/14 1843  TROPONINI 0.03 0.03 0.03   BNP (last 3 results)  Recent Labs  07/18/14 0555 09/27/14 0523 11/19/14 0745  PROBNP 1116.0* 9164.0* 5233.0*   CBG:  Recent Labs Lab  12/25/14 0747 12/25/14 1220 12/25/14 1630 12/25/14 2037 12/26/14 0745  GLUCAP 123* 145* 188* 180* 126*    No results found for this or any previous visit (from the past 240 hour(s)).   Studies: Ct Head Wo Contrast  12/25/2014   CLINICAL DATA:  Subsequent evaluation of subarachnoid hemorrhage, with headache and dizziness  EXAM: CT HEAD WITHOUT CONTRAST  TECHNIQUE: Contiguous axial images were obtained from the base of the skull through the vertex without intravenous contrast.  COMPARISON:  December 24, 1914  FINDINGS: Calvarium is intact.  Visualized sinuses clear.  No evidence of parenchymal hemorrhage. No evidence of mass or vascular territory infarct. No hydrocephalus. Vertebrobasilar calcification again noted. Again identified is hyperattenuation in the quadrigeminal and ambient cisterns. When compared to prior study there is no interval change.  IMPRESSION: No change when compared to prior study with small volume extra-axial fluid again seen in the quadrigeminal and ambient cisterns.   Electronically Signed   By: Skipper Cliche M.D.   On: 12/25/2014 11:40   Dg Abd Portable 1v  12/26/2014   CLINICAL DATA:  Abdominal pain.  EXAM: PORTABLE ABDOMEN - 1 VIEW  COMPARISON:  CT 05/20/2014.  FINDINGS: Soft tissue structures are unremarkable. Gas pattern is nonspecific. No free air. Large amount of stool in colon P Ostomy noted over the left abdomen. Vascular calcification. Degenerative changes noted in the lumbar spine and both hips. Pelvic surgical clips.  IMPRESSION: Large amount of stool in colon. Constipation cannot be excluded. No bowel distention.   Electronically Signed   By: Marcello Moores  Register   On: 12/26/2014 09:11    Scheduled Meds: . amiodarone  200 mg Oral Daily  . citalopram  20 mg Oral Daily  . digoxin  0.125 mg Oral QODAY  . feeding supplement (GLUCERNA SHAKE)  237 mL Oral BID BM  . insulin aspart  0-15 Units Subcutaneous TID WC  . insulin aspart  0-5 Units Subcutaneous QHS  .  insulin glargine  12 Units Subcutaneous QHS  . pantoprazole  40 mg Oral Daily  . ranolazine  1,000 mg Oral BID  . rosuvastatin  40 mg Oral QHS   Continuous Infusions: . sodium chloride      Principal Problem:   SAH (subarachnoid hemorrhage) Active Problems:   Diabetes mellitus, type II   Essential hypertension   PAF (paroxysmal atrial fibrillation)   Obesity   Acute-on-chronic renal failure   DM type 2, uncontrolled, with renal complications   GERD (gastroesophageal reflux disease)   CAD (coronary artery disease)   Acute renal failure   Dehydration   Chronic systolic congestive heart failure   Acute renal failure syndrome    Time spent: 35 minutes    [facility] Pager [phone]. If 7PM-7AM, please contact night-coverage at [email or web address], password [facility] 12/26/2014, 10:46 AM  LOS: 2 days

## 2014-12-27 LAB — BASIC METABOLIC PANEL
ANION GAP: 7 (ref 5–15)
BUN: 52 mg/dL — ABNORMAL HIGH (ref 6–23)
CO2: 22 mmol/L (ref 19–32)
Calcium: 8.8 mg/dL (ref 8.4–10.5)
Chloride: 108 mEq/L (ref 96–112)
Creatinine, Ser: 4.6 mg/dL — ABNORMAL HIGH (ref 0.50–1.35)
GFR calc non Af Amer: 13 mL/min — ABNORMAL LOW (ref >90)
GFR, EST AFRICAN AMERICAN: 15 mL/min — AB (ref >90)
GLUCOSE: 118 mg/dL — AB (ref 70–99)
POTASSIUM: 4.2 mmol/L (ref 3.5–5.1)
Sodium: 137 mmol/L (ref 135–145)

## 2014-12-27 LAB — PTH, INTACT AND CALCIUM
Calcium, Total (PTH): 9.1 mg/dL (ref 8.6–10.2)
PTH: 41 pg/mL (ref 15–65)

## 2014-12-27 LAB — GLUCOSE, CAPILLARY
GLUCOSE-CAPILLARY: 235 mg/dL — AB (ref 70–99)
GLUCOSE-CAPILLARY: 169 mg/dL — AB (ref 70–99)
Glucose-Capillary: 109 mg/dL — ABNORMAL HIGH (ref 70–99)
Glucose-Capillary: 130 mg/dL — ABNORMAL HIGH (ref 70–99)

## 2014-12-27 LAB — PHOSPHORUS: PHOSPHORUS: 3 mg/dL (ref 2.3–4.6)

## 2014-12-27 MED ORDER — APIXABAN 5 MG PO TABS
5.0000 mg | ORAL_TABLET | Freq: Two times a day (BID) | ORAL | Status: DC
  Administered 2014-12-27 – 2015-01-01 (×11): 5 mg via ORAL
  Filled 2014-12-27 (×11): qty 1

## 2014-12-27 NOTE — Progress Notes (Signed)
[facility] PROGRESS NOTE  [name] ZOX:[id number] DOB: 1956/07/17 DOA: 12/24/2014 PCP: Carlyle Dolly, F, MD  Assessment/Plan: Acute on chronic kidney failure stage III. Creatinine trending downward this am.  Evaluated by nephrology who opine most likely prerenal/cardiorenal/ATN and recommending continuation of iv fluids. Will monitor volume status given cardiac issues.   Active Problems:  SAH (subarachnoid hemorrhage): Secondary to fall in setting of aspirin and Elaquis.  Repeat CT after 24 hours  stable with no changes. Neurologic exam remains benign. No focal deficits. PT evaluated and recommended OP PT. Will restart eliquis.    Abdominal pain: related to constipation. Good results from cathartics. Reports feeling better.    Dehydration: see above. Holding his home Aldactone and Lasix. Gently hydrate as noted above.   Diabetes mellitus, type II: Hemoglobin A1c 10.6. good control. Continue half of his Lantus dose and provide sliding scale for optimal control.    PAF (paroxysmal atrial fibrillation): Rate controlled. Resume eliquis   Obesity: BMI 30.3 nutritional consult    GERD (gastroesophageal reflux disease): Stable at baseline   CAD (coronary artery disease): No chest pain. Initial troponin negative.     Chronic systolic congestive heart failure: Recent echo in October 8119 reveals systolic function was moderately to severely reduced. The estimated ejection fraction was in the range of 30% to 35%. Holding diuretics for now as noted. Will very gently hydrate with IV fluids. Monitor intake and output. Weight stable.    Code Status: full Family Communication: none present Disposition Plan: home hopefully tomorrow   Consultants:  nephrology  Procedures:  none  Antibiotics:  none  HPI/Subjective: Reports feeling better after large bowel movement. Does report hands getting a little "puffy"  Objective: Filed Vitals:   12/27/14  0641  BP: 125/68  Pulse: 67  Temp: 98.4 F (36.9 C)  Resp: 20    Intake/Output Summary (Last 24 hours) at 12/27/14 0843 Last data filed at 12/27/14 0535  Gross per 24 hour  Intake    240 ml  Output   2225 ml  Net  -1985 ml   Filed Weights   12/25/14 0444 12/26/14 0706 12/27/14 0641  Weight: 95.255 kg (210 lb) 96 kg (211 lb 10.3 oz) 97.6 kg (215 lb 2.7 oz)    Exam:   General:  Well nourished appears comfortable  Cardiovascular: RRR No m/g/r no LE edema hands slightly puffy  Respiratory: normal effort BS clear no crackles or wheezes  Abdomen: soft +BS colostomy bag intact  Musculoskeletal: joints without swelling/erythema    Data Reviewed: Basic Metabolic Panel:  Recent Labs Lab 12/24/14 0414 12/25/14 0540 12/26/14 0528 12/26/14 0950 12/27/14 0534  NA 132* 135 133*  --  137  K 4.6 4.1 4.3  --  4.2  CL 101 105 104  --  108  CO2 20 22 21   --  22  GLUCOSE 206* 139* 134*  --  118*  BUN 59* 57* 57*  --  52*  CREATININE 5.69* 5.35* 5.00*  --  4.60*  CALCIUM 9.1 8.9 8.7 9.1 8.8  PHOS  --   --   --   --  3.0   Liver Function Tests:  Recent Labs Lab 12/24/14 0414  AST 24  ALT 18  ALKPHOS 77  BILITOT 0.8  PROT 7.6  ALBUMIN 3.2*   No results for input(s): LIPASE, AMYLASE in the last 168 hours. No results for input(s): AMMONIA in the last 168 hours. CBC:  Recent Labs Lab 12/24/14 0414 12/25/14 0540  WBC  9.2 8.5  NEUTROABS 8.0*  --   HGB 12.1* 11.2*  HCT 36.0* 32.9*  MCV 88.2 89.2  PLT 171 156   Cardiac Enzymes:  Recent Labs Lab 12/24/14 0414 12/24/14 1212 12/24/14 1843  TROPONINI 0.03 0.03 0.03   BNP (last 3 results)  Recent Labs  07/18/14 0555 09/27/14 0523 11/19/14 0745  PROBNP 1116.0* 9164.0* 5233.0*   CBG:  Recent Labs Lab 12/25/14 2037 12/26/14 0745 12/26/14 1146 12/26/14 1704 12/26/14 2214  GLUCAP 180* 126* 175* 136* 95    No results found for this or any previous visit (from the past 240 hour(s)).   Studies: Ct  Head Wo Contrast  12/25/2014   CLINICAL DATA:  Subsequent evaluation of subarachnoid hemorrhage, with headache and dizziness  EXAM: CT HEAD WITHOUT CONTRAST  TECHNIQUE: Contiguous axial images were obtained from the base of the skull through the vertex without intravenous contrast.  COMPARISON:  December 24, 1914  FINDINGS: Calvarium is intact.  Visualized sinuses clear.  No evidence of parenchymal hemorrhage. No evidence of mass or vascular territory infarct. No hydrocephalus. Vertebrobasilar calcification again noted. Again identified is hyperattenuation in the quadrigeminal and ambient cisterns. When compared to prior study there is no interval change.  IMPRESSION: No change when compared to prior study with small volume extra-axial fluid again seen in the quadrigeminal and ambient cisterns.   Electronically Signed   By: Skipper Cliche M.D.   On: 12/25/2014 11:40   Dg Abd Portable 1v  12/26/2014   CLINICAL DATA:  Abdominal pain.  EXAM: PORTABLE ABDOMEN - 1 VIEW  COMPARISON:  CT 05/20/2014.  FINDINGS: Soft tissue structures are unremarkable. Gas pattern is nonspecific. No free air. Large amount of stool in colon P Ostomy noted over the left abdomen. Vascular calcification. Degenerative changes noted in the lumbar spine and both hips. Pelvic surgical clips.  IMPRESSION: Large amount of stool in colon. Constipation cannot be excluded. No bowel distention.   Electronically Signed   By: Marcello Moores  Register   On: 12/26/2014 09:11    Scheduled Meds: . amiodarone  200 mg Oral Daily  . citalopram  20 mg Oral Daily  . digoxin  0.125 mg Oral QODAY  . docusate sodium  100 mg Oral BID  . feeding supplement (GLUCERNA SHAKE)  237 mL Oral BID BM  . insulin aspart  0-15 Units Subcutaneous TID WC  . insulin aspart  0-5 Units Subcutaneous QHS  . insulin glargine  12 Units Subcutaneous QHS  . pantoprazole  40 mg Oral Daily  . ranolazine  1,000 mg Oral BID  . rosuvastatin  40 mg Oral QHS   Continuous Infusions: .  sodium chloride 10 mL/hr at 12/26/14 2224    Principal Problem:   SAH (subarachnoid hemorrhage) Active Problems:   Diabetes mellitus, type II   Essential hypertension   PAF (paroxysmal atrial fibrillation)   Obesity   Acute-on-chronic renal failure   DM type 2, uncontrolled, with renal complications   GERD (gastroesophageal reflux disease)   CAD (coronary artery disease)   Acute renal failure   Dehydration   Chronic systolic congestive heart failure   Acute renal failure syndrome   Abdominal pain, generalized    Time spent: [facility] Pager [phone]. If 7PM-7AM, please contact night-coverage at [email or web address], password [facility] 12/27/2014, 8:43 AM  LOS: 3 days

## 2014-12-27 NOTE — Progress Notes (Signed)
Subjective: Interval History: has no complaint of nausea or vomiting. Presently patient says that he is feeling better. Patient also denies any difficulty in breathing. Patient stated that his IV was decreased last night because of swelling of his hands. His swelling how ever has gone down this morning Objective: Vital signs in last 24 hours: Temp:  [97.5 F (36.4 C)-98.6 F (37 C)] 98.4 F (36.9 C) (01/22 0641) Pulse Rate:  [67-91] 67 (01/22 0641) Resp:  [18-20] 20 (01/22 0641) BP: (116-132)/(64-76) 125/68 mmHg (01/22 0641) SpO2:  [98 %-100 %] 99 % (01/22 0641) Weight:  [96 kg (211 lb 10.3 oz)] 96 kg (211 lb 10.3 oz) (01/21 0706) Weight change:   Intake/Output from previous day: 01/21 0701 - 01/22 0700 In: 240 [P.O.:240] Out: 2925 [Urine:2925] Intake/Output this shift: Total I/O In: -  Out: 1300 [Urine:1300]  General appearance: alert, cooperative and no distress Resp: clear to auscultation bilaterally Cardio: regular rate and rhythm, S1, S2 normal, no murmur, click, rub or gallop GI: soft, non-tender; bowel sounds normal; no masses,  no organomegaly Extremities: edema Trace 1+ edema  Lab Results:  Recent Labs  12/25/14 0540  WBC 8.5  HGB 11.2*  HCT 32.9*  PLT 156   BMET:  Recent Labs  12/25/14 0540 12/26/14 0528  NA 135 133*  K 4.1 4.3  CL 105 104  CO2 22 21  GLUCOSE 139* 134*  BUN 57* 57*  CREATININE 5.35* 5.00*  CALCIUM 8.9 8.7   No results for input(s): PTH in the last 72 hours. Iron Studies: No results for input(s): IRON, TIBC, TRANSFERRIN, FERRITIN in the last 72 hours.  Studies/Results: Ct Head Wo Contrast  12/25/2014   CLINICAL DATA:  Subsequent evaluation of subarachnoid hemorrhage, with headache and dizziness  EXAM: CT HEAD WITHOUT CONTRAST  TECHNIQUE: Contiguous axial images were obtained from the base of the skull through the vertex without intravenous contrast.  COMPARISON:  December 24, 1914  FINDINGS: Calvarium is intact.  Visualized sinuses  clear.  No evidence of parenchymal hemorrhage. No evidence of mass or vascular territory infarct. No hydrocephalus. Vertebrobasilar calcification again noted. Again identified is hyperattenuation in the quadrigeminal and ambient cisterns. When compared to prior study there is no interval change.  IMPRESSION: No change when compared to prior study with small volume extra-axial fluid again seen in the quadrigeminal and ambient cisterns.   Electronically Signed   By: Skipper Cliche M.D.   On: 12/25/2014 11:40   Dg Abd Portable 1v  12/26/2014   CLINICAL DATA:  Abdominal pain.  EXAM: PORTABLE ABDOMEN - 1 VIEW  COMPARISON:  CT 05/20/2014.  FINDINGS: Soft tissue structures are unremarkable. Gas pattern is nonspecific. No free air. Large amount of stool in colon P Ostomy noted over the left abdomen. Vascular calcification. Degenerative changes noted in the lumbar spine and both hips. Pelvic surgical clips.  IMPRESSION: Large amount of stool in colon. Constipation cannot be excluded. No bowel distention.   Electronically Signed   By: Marcello Moores  Register   On: 12/26/2014 09:11    I have reviewed the patient's current medications.  Assessment/Plan: Problem #1 acute kidney injury: Possibly ATN versus prerenal. Presently patient is nonoliguric. His BUN and creatinine is improving slowly. His potassium is normal Problem #2 diabetes: He is on insulin and his blood glucose seems reasonable Problem #3 history of hypertension: His blood pressure is reasonably controlled Problem #4 of atrial fibrillation his heart rate is controlled Problem #5 subarachnoid hemorrhage Problem #6 rectal cancer Problem#7 History of  CHF : Patient on lasix he had about 2900 cc of urine out put and patient is asymptomatic. His hand swelling may bet related to iv infiltrate as presently looks better Plan: We'll continue his hydration We'll check his basic metabolic panel in the morning.    LOS: 3 days   Makylie Rivere S 12/27/2014,6:43  AM

## 2014-12-28 ENCOUNTER — Inpatient Hospital Stay (HOSPITAL_COMMUNITY): Payer: Medicare Other

## 2014-12-28 DIAGNOSIS — E669 Obesity, unspecified: Secondary | ICD-10-CM

## 2014-12-28 LAB — BASIC METABOLIC PANEL
Anion gap: 9 (ref 5–15)
BUN: 49 mg/dL — ABNORMAL HIGH (ref 6–23)
CALCIUM: 8.5 mg/dL (ref 8.4–10.5)
CO2: 18 mmol/L — ABNORMAL LOW (ref 19–32)
Chloride: 107 mmol/L (ref 96–112)
Creatinine, Ser: 4.24 mg/dL — ABNORMAL HIGH (ref 0.50–1.35)
GFR calc Af Amer: 16 mL/min — ABNORMAL LOW (ref >90)
GFR calc non Af Amer: 14 mL/min — ABNORMAL LOW (ref >90)
Glucose, Bld: 159 mg/dL — ABNORMAL HIGH (ref 70–99)
Potassium: 4.7 mmol/L (ref 3.5–5.1)
Sodium: 134 mmol/L — ABNORMAL LOW (ref 135–145)

## 2014-12-28 LAB — GLUCOSE, CAPILLARY
GLUCOSE-CAPILLARY: 142 mg/dL — AB (ref 70–99)
GLUCOSE-CAPILLARY: 175 mg/dL — AB (ref 70–99)
Glucose-Capillary: 144 mg/dL — ABNORMAL HIGH (ref 70–99)
Glucose-Capillary: 197 mg/dL — ABNORMAL HIGH (ref 70–99)

## 2014-12-28 MED ORDER — ONDANSETRON HCL 4 MG/2ML IJ SOLN
4.0000 mg | Freq: Four times a day (QID) | INTRAMUSCULAR | Status: DC | PRN
  Administered 2014-12-28: 4 mg via INTRAVENOUS
  Filled 2014-12-28: qty 2

## 2014-12-28 NOTE — Progress Notes (Signed)
Subjective: Interval History: The patient claims is feeling better everyday. Presently he doesn't have any difficulty breathing. His appetite is good he feels somewhat weak. Objective: Vital signs in last 24 hours: Temp:  [97.8 F (36.6 C)-98.6 F (37 C)] 98.6 F (37 C) (01/23 0631) Pulse Rate:  [73-74] 73 (01/22 2125) Resp:  [17-20] 17 (01/23 0631) BP: (116-137)/(70-73) 137/73 mmHg (01/22 2125) SpO2:  [94 %-98 %] 94 % (01/23 0631) Weight:  [97.2 kg (214 lb 4.6 oz)] 97.2 kg (214 lb 4.6 oz) (01/23 0631) Weight change: 1.2 kg (2 lb 10.3 oz)  Intake/Output from previous day: 01/22 0701 - 01/23 0700 In: 1916.3 [P.O.:1320; I.V.:596.3] Out: 2250 [Urine:2250] Intake/Output this shift: Total I/O In: -  Out: 325 [Urine:325]  Generally patient is alert and in no apparent distress. Chest: Decreased breath sound but no wheezing or rales. His heart exam revealed regular rate and rhythm no murmur Abdomen: Distended but nontender. He has positive bowel sounds. Extremities: He has trace edema  Lab Results: No results for input(s): WBC, HGB, HCT, PLT in the last 72 hours. BMET:   Recent Labs  12/27/14 0534 12/28/14 0706  NA 137 134*  K 4.2 4.7  CL 108 107  CO2 22 18*  GLUCOSE 118* 159*  BUN 52* 49*  CREATININE 4.60* 4.24*  CALCIUM 8.8 8.5    Recent Labs  12/26/14 0950  PTH 41  Comment   Iron Studies: No results for input(s): IRON, TIBC, TRANSFERRIN, FERRITIN in the last 72 hours.  Studies/Results: No results found.  I have reviewed the patient's current medications.  Assessment/Plan: Problem #1 acute kidney injury: Possibly ATN versus prerenal. Patient's renal function progressively improving. Patient does not have any nausea or vomiting. Problem #2 diabetes: He is on insulin and his blood glucose seems reasonable Problem #3 history of hypertension: His blood pressure is well controlled Problem #4  atrial fibrillation his heart rate is controlled Problem #5  subarachnoid hemorrhage Problem #6 rectal cancer Problem #7 history of CHF: Presently patient is on diuretics. Patient with good urine output and denies any difficulty breathing orthopnea. Patient has 2200 mL of urine output over the last 24 hours. Problem#7 ischemic cardiomyopathy Problem #8 metabolic bone disease: His calcium and phosphorous is range. Plan: We'll continue his hydration We'll check his basic metabolic panel in the morning.    LOS: 4 days   Aroura Vasudevan S 12/28/2014,11:52 AM

## 2014-12-28 NOTE — Progress Notes (Signed)
[facility] PROGRESS NOTE  [name] ZYY:[id number] DOB: 1956/07/19 DOA: 12/24/2014 PCP: Carlyle Dolly, F, MD  Assessment/Plan: Acute on chronic kidney failure stage III. Creatinine is steadily trending down, albeit slowly.Followed by nephrology who suspects prerenal/cardiorenal/ATN and recommending continuation of iv fluids. Will monitor volume status given cardiac issues.   SAH (subarachnoid hemorrhage): Secondary to mechanical fall in setting of aspirin and Elaquis. Repeat CT after 24 hoursstable with no changes. Neurologic exam remains benign. No focal deficits. PT evaluated and recommended OP PT. Restarted eliquis.   Abdominal pain: related to constipation. Good results from cathartics. Reports feeling better.    Dehydration: see above. Holding his home Aldactone and Lasix. Gently hydrate as noted above.   Diabetes mellitus, type II: Hemoglobin A1c 10.6. good control. Continue half of his Lantus dose and provide sliding scale for optimal control.    PAF (paroxysmal atrial fibrillation): Rate controlled. Resume eliquis   Obesity: BMI 30.3 nutritional consult    GERD (gastroesophageal reflux disease): Stable at baseline   CAD (coronary artery disease): No chest pain. Initial troponin negative.  Code Status: Full Family Communication: Pt in room (indicate person spoken with, relationship, and if by phone, the number) Disposition Plan: Pending   Consultants:  Nephrology  Procedures:    Antibiotics:  none (indicate start date, and stop date if known)  HPI/Subjective: No complaints. Denies sob.  Objective: Filed Vitals:   12/27/14 1445 12/27/14 1639 12/27/14 2125 12/28/14 0631  BP: 116/70  137/73   Pulse: 74  73   Temp: 97.9 F (36.6 C) 98.2 F (36.8 C) 97.8 F (36.6 C) 98.6 F (37 C)  TempSrc: Oral Oral Oral Oral  Resp: 20 20 20 17   Height:      Weight:    97.2 kg (214 lb 4.6 oz)  SpO2: 97% 98% 96% 94%    Intake/Output  Summary (Last 24 hours) at 12/28/14 1203 Last data filed at 12/28/14 1121  Gross per 24 hour  Intake 1436.33 ml  Output   2175 ml  Net -738.67 ml   Filed Weights   12/26/14 0706 12/27/14 0641 12/28/14 0631  Weight: 96 kg (211 lb 10.3 oz) 97.6 kg (215 lb 2.7 oz) 97.2 kg (214 lb 4.6 oz)    Exam:   General:  Awake, in nad  Cardiovascular: regular, s1, s2  Respiratory: normal resp effort, no wheezing  Abdomen: soft,nondistended  Musculoskeletal: perfused, no clubbing   Data Reviewed: Basic Metabolic Panel:  Recent Labs Lab 12/24/14 0414 12/25/14 0540 12/26/14 0528 12/26/14 0950 12/27/14 0534 12/28/14 0706  NA 132* 135 133*  --  137 134*  K 4.6 4.1 4.3  --  4.2 4.7  CL 101 105 104  --  108 107  CO2 20 22 21   --  22 18*  GLUCOSE 206* 139* 134*  --  118* 159*  BUN 59* 57* 57*  --  52* 49*  CREATININE 5.69* 5.35* 5.00*  --  4.60* 4.24*  CALCIUM 9.1 8.9 8.7 9.1 8.8 8.5  PHOS  --   --   --   --  3.0  --    Liver Function Tests:  Recent Labs Lab 12/24/14 0414  AST 24  ALT 18  ALKPHOS 77  BILITOT 0.8  PROT 7.6  ALBUMIN 3.2*   No results for input(s): LIPASE, AMYLASE in the last 168 hours. No results for input(s): AMMONIA in the last 168 hours. CBC:  Recent Labs Lab 12/24/14 0414 12/25/14 0540  WBC 9.2 8.5  NEUTROABS  8.0*  --   HGB 12.1* 11.2*  HCT 36.0* 32.9*  MCV 88.2 89.2  PLT 171 156   Cardiac Enzymes:  Recent Labs Lab 12/24/14 0414 12/24/14 1212 12/24/14 1843  TROPONINI 0.03 0.03 0.03   BNP (last 3 results)  Recent Labs  07/18/14 0555 09/27/14 0523 11/19/14 0745  PROBNP 1116.0* 9164.0* 5233.0*   CBG:  Recent Labs Lab 12/27/14 1203 12/27/14 1637 12/27/14 2124 12/28/14 0739 12/28/14 1120  GLUCAP 235* 130* 169* 144* 142*    No results found for this or any previous visit (from the past 240 hour(s)).   Studies: No results found.  Scheduled Meds: . amiodarone  200 mg Oral Daily  . apixaban  5 mg Oral BID  . citalopram   20 mg Oral Daily  . digoxin  0.125 mg Oral QODAY  . docusate sodium  100 mg Oral BID  . feeding supplement (GLUCERNA SHAKE)  237 mL Oral BID BM  . insulin aspart  0-15 Units Subcutaneous TID WC  . insulin aspart  0-5 Units Subcutaneous QHS  . insulin glargine  12 Units Subcutaneous QHS  . pantoprazole  40 mg Oral Daily  . ranolazine  1,000 mg Oral BID  . rosuvastatin  40 mg Oral QHS   Continuous Infusions: . sodium chloride 125 mL/hr at 12/27/14 1038    Principal Problem:   SAH (subarachnoid hemorrhage) Active Problems:   Diabetes mellitus, type II   Essential hypertension   PAF (paroxysmal atrial fibrillation)   Obesity   Acute-on-chronic renal failure   DM type 2, uncontrolled, with renal complications   GERD (gastroesophageal reflux disease)   CAD (coronary artery disease)   Acute renal failure   Dehydration   Chronic systolic congestive heart failure   Acute renal failure syndrome   Abdominal pain, generalized  Time spent: 62min  [facility], [facility] Pager [phone]. If 7PM-7AM, please contact night-coverage at [email or web address], password [facility] 12/28/2014, 12:03 PM  LOS: 4 days

## 2014-12-29 ENCOUNTER — Inpatient Hospital Stay (HOSPITAL_COMMUNITY): Payer: Medicare Other

## 2014-12-29 LAB — BASIC METABOLIC PANEL
ANION GAP: 8 (ref 5–15)
BUN: 48 mg/dL — AB (ref 6–23)
CHLORIDE: 107 mmol/L (ref 96–112)
CO2: 18 mmol/L — AB (ref 19–32)
Calcium: 8.3 mg/dL — ABNORMAL LOW (ref 8.4–10.5)
Creatinine, Ser: 3.96 mg/dL — ABNORMAL HIGH (ref 0.50–1.35)
GFR calc Af Amer: 18 mL/min — ABNORMAL LOW (ref >90)
GFR calc non Af Amer: 15 mL/min — ABNORMAL LOW (ref >90)
GLUCOSE: 140 mg/dL — AB (ref 70–99)
POTASSIUM: 4.8 mmol/L (ref 3.5–5.1)
Sodium: 133 mmol/L — ABNORMAL LOW (ref 135–145)

## 2014-12-29 LAB — MAGNESIUM: Magnesium: 1.8 mg/dL (ref 1.5–2.5)

## 2014-12-29 LAB — GLUCOSE, CAPILLARY
GLUCOSE-CAPILLARY: 130 mg/dL — AB (ref 70–99)
GLUCOSE-CAPILLARY: 186 mg/dL — AB (ref 70–99)
GLUCOSE-CAPILLARY: 150 mg/dL — AB (ref 70–99)
Glucose-Capillary: 165 mg/dL — ABNORMAL HIGH (ref 70–99)
Glucose-Capillary: 204 mg/dL — ABNORMAL HIGH (ref 70–99)

## 2014-12-29 MED ORDER — SODIUM CHLORIDE 0.9 % IJ SOLN: 3.0000 mL | INTRAMUSCULAR | Status: DC | PRN

## 2014-12-29 MED ORDER — FUROSEMIDE 80 MG PO TABS
80.0000 mg | ORAL_TABLET | Freq: Every day | ORAL | Status: DC
  Administered 2014-12-29 – 2014-12-31 (×3): 80 mg via ORAL
  Filled 2014-12-29 (×3): qty 1

## 2014-12-29 MED ORDER — SODIUM CHLORIDE 0.9 % IJ SOLN
3.0000 mL | Freq: Two times a day (BID) | INTRAMUSCULAR | Status: DC
  Administered 2014-12-29 – 2015-01-05 (×14): 3 mL via INTRAVENOUS

## 2014-12-29 MED ORDER — SODIUM CHLORIDE 0.9 % IV SOLN: 250.0000 mL | INTRAVENOUS | Status: DC | PRN

## 2014-12-29 NOTE — Progress Notes (Signed)
Throughout shift, central telemetry alarms V-tach, monitor showing runs of wide PVC with HR below 100 and no pacer spikes, patient asymptomatic, MD notified, EKG's obtained.

## 2014-12-29 NOTE — Progress Notes (Signed)
[facility] PROGRESS NOTE  [name] JYN:[id number] DOB: Jun 25, 1956 DOA: 12/24/2014 PCP: Dina Rich, F, MD  Assessment/Plan: Acute on chronic kidney failure stage III. Creatinine is steadily trending down.Very much appreciate assistance by nephrology who suspects prerenal/cardiorenal/ATN. Renal function responded to IVF. Unfortunately, as pt is now showing signs of volume overload, IVF has been stopped.  SAH (subarachnoid hemorrhage): Secondary to mechanical fall in setting of aspirin and Elaquis. Repeat CT after 24 hoursstable with no changes. Neurologic exam remains benign. No focal deficits. PT evaluated and recommended OP PT. Restarted eliquis. Pt complained of headache on 1/23 thus repeat CT was done revealing interval near-complete resolution of blood in basilar cisterns.  Abdominal pain: related to constipation. Good results from cathartics. Reports feeling better.    Dehydration: see above.    Diabetes mellitus, type II: Hemoglobin A1c 10.6. good control. Continue half of his Lantus dose and provide sliding scale for optimal control.    PAF (paroxysmal atrial fibrillation): Rate controlled. Resumed eliquis   Obesity: BMI 30.3 nutritional consult    GERD (gastroesophageal reflux disease): Stable at baseline   CAD (coronary artery disease): No chest pain. Initial troponin negative.  Chronic Systolic CHF: Pt is now showing signs of volume overload. IVF now on hold with 80mg  bid lasix ordered by Nephrology. Follow i/o and daily weights  Wt trends: 95.25kg ->96kg ->97.6kg -> 97.2kg -> 101.6kg currently  Code Status: Full Family Communication: Pt in room  Disposition Plan: Pending   Consultants:  Nephrology  Procedures:    Antibiotics:  none (indicate start date, and stop date if known)  HPI/Subjective: No acute events overnight. Pt reports transient episode of confusion overnight, since resolved. Denies sob.  Objective: Filed  Vitals:   12/28/14 1431 12/28/14 2133 12/29/14 0617 12/29/14 0850  BP:  137/76 127/68 112/68  Pulse: 74 73 73 83  Temp: 97.8 F (36.6 C) 97.7 F (36.5 C) 98.1 F (36.7 C)   TempSrc: Oral Oral Oral   Resp: 18   18  Height:      Weight:   101.6 kg (223 lb 15.8 oz)   SpO2: 96% 97% 95% 95%    Intake/Output Summary (Last 24 hours) at 12/29/14 1337 Last data filed at 12/29/14 1049  Gross per 24 hour  Intake 3416.25 ml  Output   1250 ml  Net 2166.25 ml   Filed Weights   12/27/14 0641 12/28/14 0631 12/29/14 0617  Weight: 97.6 kg (215 lb 2.7 oz) 97.2 kg (214 lb 4.6 oz) 101.6 kg (223 lb 15.8 oz)    Exam:   General:  Awake, in nad  Cardiovascular: regular, s1, s2  Respiratory: normal resp effort, no wheezing  Abdomen: soft,nondistended  Musculoskeletal: perfused, no clubbing, mild LE edema  Data Reviewed: Basic Metabolic Panel:  Recent Labs Lab 12/25/14 0540 12/26/14 0528 12/26/14 0950 12/27/14 0534 12/28/14 0706 12/29/14 0629  NA 135 133*  --  137 134* 133*  K 4.1 4.3  --  4.2 4.7 4.8  CL 105 104  --  108 107 107  CO2 22 21  --  22 18* 18*  GLUCOSE 139* 134*  --  118* 159* 140*  BUN 57* 57*  --  52* 49* 48*  CREATININE 5.35* 5.00*  --  4.60* 4.24* 3.96*  CALCIUM 8.9 8.7 9.1 8.8 8.5 8.3*  MG  --   --   --   --   --  1.8  PHOS  --   --   --  3.0  --   --  Liver Function Tests:  Recent Labs Lab 12/24/14 0414  AST 24  ALT 18  ALKPHOS 77  BILITOT 0.8  PROT 7.6  ALBUMIN 3.2*   No results for input(s): LIPASE, AMYLASE in the last 168 hours. No results for input(s): AMMONIA in the last 168 hours. CBC:  Recent Labs Lab 12/24/14 0414 12/25/14 0540  WBC 9.2 8.5  NEUTROABS 8.0*  --   HGB 12.1* 11.2*  HCT 36.0* 32.9*  MCV 88.2 89.2  PLT 171 156   Cardiac Enzymes:  Recent Labs Lab 12/24/14 0414 12/24/14 1212 12/24/14 1843  TROPONINI 0.03 0.03 0.03   BNP (last 3 results)  Recent Labs  07/18/14 0555 09/27/14 0523 11/19/14 0745  PROBNP  1116.0* 9164.0* 5233.0*   CBG:  Recent Labs Lab 12/28/14 1120 12/28/14 1707 12/28/14 2115 12/29/14 0748 12/29/14 1215  GLUCAP 142* 197* 175* 130* 165*    No results found for this or any previous visit (from the past 240 hour(s)).   Studies: Ct Head Wo Contrast  12/28/2014   CLINICAL DATA:  Recent fall with head injury and subarachnoid hemorrhage. History of diabetes, atrial fibrillation, chronic kidney disease and AICD. History of rectal and prostate cancer. Subsequent encounter.  EXAM: CT HEAD WITHOUT CONTRAST  TECHNIQUE: Contiguous axial images were obtained from the base of the skull through the vertex without intravenous contrast.  COMPARISON:  Head CT 12/25/2014 and 12/24/2014.  FINDINGS: There has been interval near-complete resolution of the previously demonstrated hyperdensity within the quadrigeminal and ambient cisterns. There is no progressive subarachnoid hemorrhage. There is no evidence of acute infarct, hydrocephalus or intraparenchymal hemorrhage. Diffuse vascular calcifications are noted.  The visualized paranasal sinuses, mastoid air cells and middle ears are clear. The calvarium is intact.  IMPRESSION: 1. Interval near-complete resolution of previously demonstrated blood in the basilar cisterns. 2. No new findings.  No evidence of hydrocephalus.   Electronically Signed   By: Roxy Horseman M.D.   On: 12/28/2014 18:45   Dg Chest Port 1 View  12/29/2014   CLINICAL DATA:  Short of breath  EXAM: PORTABLE CHEST - 1 VIEW  COMPARISON:  11/19/2014  FINDINGS: There is a left chest wall ICD with leads in the right atrial appendage, coronary sinus and right ventricle. There is mild cardiac enlargement. No pleural effusion or edema. No airspace consolidation.  IMPRESSION: 1. No active cardiopulmonary abnormalities.   Electronically Signed   By: Signa Kell M.D.   On: 12/29/2014 10:14    Scheduled Meds: . amiodarone  200 mg Oral Daily  . apixaban  5 mg Oral BID  . citalopram  20 mg  Oral Daily  . digoxin  0.125 mg Oral QODAY  . docusate sodium  100 mg Oral BID  . feeding supplement (GLUCERNA SHAKE)  237 mL Oral BID BM  . furosemide  80 mg Oral Daily  . insulin aspart  0-15 Units Subcutaneous TID WC  . insulin aspart  0-5 Units Subcutaneous QHS  . insulin glargine  12 Units Subcutaneous QHS  . pantoprazole  40 mg Oral Daily  . ranolazine  1,000 mg Oral BID  . rosuvastatin  40 mg Oral QHS  . sodium chloride  3 mL Intravenous Q12H   Continuous Infusions:    Principal Problem:   SAH (subarachnoid hemorrhage) Active Problems:   Diabetes mellitus, type II   Essential hypertension   PAF (paroxysmal atrial fibrillation)   Obesity   Acute-on-chronic renal failure   DM type 2, uncontrolled, with renal complications  GERD (gastroesophageal reflux disease)   CAD (coronary artery disease)   Acute renal failure   Dehydration   Chronic systolic congestive heart failure   Acute renal failure syndrome   Abdominal pain, generalized  Time spent:  Bulah Lurie K  [facility] Pager [phone]. If 7PM-7AM, please contact night-coverage at [email or web address], password [facility] 12/29/2014, 1:37 PM  LOS: 5 days

## 2014-12-29 NOTE — Progress Notes (Signed)
Subjective: Interval History: Patient complains of feeling dizzy when he is walking. He states that his leg edema was also slightly more than usual. He denies any difficulty breathing or orthopnea. His appetite is good and he doesn't have any nausea or vomiting. Objective: Vital signs in last 24 hours: Temp:  [97.7 F (36.5 C)-98.1 F (36.7 C)] 98.1 F (36.7 C) (01/24 0617) Pulse Rate:  [73-83] 83 (01/24 0850) Resp:  [18] 18 (01/24 0850) BP: (112-137)/(68-76) 112/68 mmHg (01/24 0850) SpO2:  [95 %-97 %] 95 % (01/24 0850) Weight:  [101.6 kg (223 lb 15.8 oz)] 101.6 kg (223 lb 15.8 oz) (01/24 0617) Weight change: 4.4 kg (9 lb 11.2 oz)  Intake/Output from previous day: 01/23 0701 - 01/24 0700 In: 3296.3 [P.O.:240; I.V.:3056.3] Out: 950 [Urine:950] Intake/Output this shift:    General appearance: alert, cooperative and no distress Resp: clear to auscultation bilaterally Cardio: regular rate and rhythm, S1, S2 normal, no murmur, click, rub or gallop GI: soft, non-tender; bowel sounds normal; no masses,  no organomegaly Extremities: edema Trace 1+ edema  Lab Results: No results for input(s): WBC, HGB, HCT, PLT in the last 72 hours. BMET:   Recent Labs  12/28/14 0706 12/29/14 0629  NA 134* 133*  K 4.7 4.8  CL 107 107  CO2 18* 18*  GLUCOSE 159* 140*  BUN 49* 48*  CREATININE 4.24* 3.96*  CALCIUM 8.5 8.3*    Recent Labs  12/26/14 0950  PTH 41  Comment   Iron Studies: No results for input(s): IRON, TIBC, TRANSFERRIN, FERRITIN in the last 72 hours.  Studies/Results: Ct Head Wo Contrast  12/28/2014   CLINICAL DATA:  Recent fall with head injury and subarachnoid hemorrhage. History of diabetes, atrial fibrillation, chronic kidney disease and AICD. History of rectal and prostate cancer. Subsequent encounter.  EXAM: CT HEAD WITHOUT CONTRAST  TECHNIQUE: Contiguous axial images were obtained from the base of the skull through the vertex without intravenous contrast.  COMPARISON:   Head CT 12/25/2014 and 12/24/2014.  FINDINGS: There has been interval near-complete resolution of the previously demonstrated hyperdensity within the quadrigeminal and ambient cisterns. There is no progressive subarachnoid hemorrhage. There is no evidence of acute infarct, hydrocephalus or intraparenchymal hemorrhage. Diffuse vascular calcifications are noted.  The visualized paranasal sinuses, mastoid air cells and middle ears are clear. The calvarium is intact.  IMPRESSION: 1. Interval near-complete resolution of previously demonstrated blood in the basilar cisterns. 2. No new findings.  No evidence of hydrocephalus.   Electronically Signed   By: Camie Patience M.D.   On: 12/28/2014 18:45    I have reviewed the patient's current medications.  Assessment/Plan: Problem #1 acute kidney injury: Possibly ATN versus prerenal. His renal function is progressively improving. Patient presently doesn't have any uremic signs and symptoms. Problem #2 diabetes: He is on insulin and his blood glucose seems reasonable Problem #3 history of hypertension: His blood pressure is reasonably controlled . Patient states that he had some episode of dizziness yesterday when he was walking. Problem #4 of atrial fibrillation his heart rate is controlled Problem #5 subarachnoid hemorrhage: Seems to be stable. Problem #6 rectal cancer Problem#7 History of CHF : Patient had 2200 mL of urine output the last 24 hours. However patient seems to be gaining weight and has also some edema. Plan: We'll DC IV fluid We'll start him on Lasix 80 mg by mouth once a day We'll check his basic metabolic panel in the morning.    LOS: 5 days   [facility]  S 12/29/2014,8:59 AM

## 2014-12-29 NOTE — Treatment Plan (Signed)
EKG from this afternoon reviewed, as was one from this AM. EKG again confirms paced rhythm with pacer spikes evident. Pt is otherwise asymptomatic. Consider changing to different tele monitor.

## 2014-12-30 DIAGNOSIS — E872 Acidosis, unspecified: Secondary | ICD-10-CM | POA: Diagnosis not present

## 2014-12-30 DIAGNOSIS — G9340 Encephalopathy, unspecified: Secondary | ICD-10-CM | POA: Diagnosis not present

## 2014-12-30 LAB — BASIC METABOLIC PANEL
Anion gap: 9 (ref 5–15)
BUN: 48 mg/dL — ABNORMAL HIGH (ref 6–23)
CALCIUM: 8.7 mg/dL (ref 8.4–10.5)
CHLORIDE: 106 mmol/L (ref 96–112)
CO2: 18 mmol/L — ABNORMAL LOW (ref 19–32)
Creatinine, Ser: 4.1 mg/dL — ABNORMAL HIGH (ref 0.50–1.35)
GFR calc Af Amer: 17 mL/min — ABNORMAL LOW (ref >90)
GFR calc non Af Amer: 15 mL/min — ABNORMAL LOW (ref >90)
Glucose, Bld: 111 mg/dL — ABNORMAL HIGH (ref 70–99)
POTASSIUM: 4.6 mmol/L (ref 3.5–5.1)
SODIUM: 133 mmol/L — AB (ref 135–145)

## 2014-12-30 LAB — GLUCOSE, CAPILLARY
GLUCOSE-CAPILLARY: 105 mg/dL — AB (ref 70–99)
GLUCOSE-CAPILLARY: 162 mg/dL — AB (ref 70–99)
Glucose-Capillary: 132 mg/dL — ABNORMAL HIGH (ref 70–99)
Glucose-Capillary: 133 mg/dL — ABNORMAL HIGH (ref 70–99)

## 2014-12-30 LAB — BLOOD GAS, ARTERIAL
ACID-BASE DEFICIT: 5.4 mmol/L — AB (ref 0.0–2.0)
Bicarbonate: 18.3 mEq/L — ABNORMAL LOW (ref 20.0–24.0)
Drawn by: 23534
FIO2: 0.21 %
O2 CONTENT: 21 L/min
O2 Saturation: 94.3 %
PH ART: 7.411 (ref 7.350–7.450)
PO2 ART: 74.7 mmHg — AB (ref 80.0–100.0)
Patient temperature: 37
TCO2: 16.7 mmol/L (ref 0–100)
pCO2 arterial: 29.4 mmHg — ABNORMAL LOW (ref 35.0–45.0)

## 2014-12-30 MED ORDER — SODIUM BICARBONATE 650 MG PO TABS
650.0000 mg | ORAL_TABLET | Freq: Two times a day (BID) | ORAL | Status: DC
  Administered 2014-12-30 – 2015-01-04 (×10): 650 mg via ORAL
  Filled 2014-12-30 (×11): qty 1

## 2014-12-30 NOTE — Progress Notes (Signed)
Inpatient Diabetes Program Recommendations  AACE/ADA: New Consensus Statement on Inpatient Glycemic Control (2013)  Target Ranges:  Prepandial:   less than 140 mg/dL      Peak postprandial:   less than 180 mg/dL (1-2 hours)      Critically ill patients:  140 - 180 mg/dL   Results for JORDELL, OUTTEN (MRN [id number]) as of 12/30/2014 10:17  Ref. Range 12/29/2014 07:48 12/29/2014 12:15 12/29/2014 13:51 12/29/2014 16:35 12/29/2014 21:29 12/30/2014 07:41  Glucose-Capillary Latest Range: 70-99 mg/dL 130 (H) 165 (H) 186 (H) 204 (H) 150 (H) 105 (H)   Diabetes history: DM2 Outpatient Diabetes medications: Lantus 20 units QHS, Novolog 6 units TID with meals Current orders for Inpatient glycemic control: Lantus 12 units QHS, Novolog 0-15 units TID with meals, Novolog 0-5 units QHS  Inpatient Diabetes Program Recommendations Insulin - Meal Coverage: Please consider ordering Novolog 3 units TID with meals for meal coverage (in addition to Novolog correction scale).  Thanks, Barnie Alderman, RN, MSN, CCRN, CDE Diabetes Coordinator Inpatient Diabetes Program [phone] (Team Pager) [phone] ([place] office) [phone] [facility] office)

## 2014-12-30 NOTE — Evaluation (Signed)
Physical Therapy Evaluation Patient Details Name: [name] MRN: [id number] DOB: 1956/05/05 Today's Date: 12/30/2014   History of Present Illness  Pt was admitted on 12-24-14 secondary to acute on chronic renal failure and a fall at home which caused a sub arachnoid hemmorhage.  He has multiple medical problems to include an ischemic cardiomyopathy with an EF of 15%, DM, Afib.  He was evaluated by PT on 12-25-14 and found to have generalized weaknessfor which OP PT was recommended.  We are asked to reassess pt today as he is reporting severe LE weakness and dizziness.  Clinical Impression   Pt was seen for a reassessment.  He states that he has been trying to walk about in the room but has been staggering due to dizziness and legs feel as if they are "giving way".    He states that he has this problem intermittently over the past year and no one has been able to diagnose the etiology.  He is not aware of having had a neurology work up.  Today, these problems have developed again.  He has no dizziness in supine but it  immediately develops in sitting.  The dizziness resolved after about 5 min of sitting but returned upon standing.  CNA is scheduled to evaluate for orthostatic hypotension.  I did a vestibular eval and saw no problems in that regard.  All musculature is intact on manual muscle test and pt is able to transfer independently.  Sitting balance is good.  His LEs tremble in stance and he can barely lift either foot up off of the floor to take a step while holding onto a walker.  I have spoken with Dyanne Carrel, NP about these findings.  If his dizziness and severe weakness do not resolve, He may need to go to CIR or SNF.  He may also need a walker.  We will keep him on our schedule in hopes of trying to improve his function.    Follow Up Recommendations  (to be determined)    Equipment Recommendations  Other (comment) (to be determined)    Recommendations for Other Services   to be  determined    Precautions / Restrictions Precautions Precautions: Fall Restrictions Weight Bearing Restrictions: No      Mobility  Bed Mobility Overal bed mobility: Modified Independent             General bed mobility comments: Use of bed rails  Transfers Overall transfer level: Needs assistance Equipment used: Rolling walker (2 wheeled) Transfers: Sit to/from Stand Sit to Stand: Supervision         General transfer comment: when pt sits, he reports dizziness which finally subsides after about 5 minutes of sitting quiety.Marland KitchenMarland KitchenUpon standing, LEs tremble and he is barely able to flex hips enough to lift feet off of the floor to step in place  Ambulation/Gait Ambulation/Gait assistance:  (not tested due to significant weakness in LEs and dizziness with standing)              Stairs            Wheelchair Mobility    Modified Rankin (Stroke Patients Only)       Balance Overall balance assessment: History of Falls;Needs assistance Sitting-balance support: No upper extremity supported;Feet supported Sitting balance-Leahy Scale: Normal         Standing balance comment: unable to test standing strength due to dizziness...currently needs bilateral hand support to maintain stance  Pertinent Vitals/Pain Pain Assessment: No/denies pain    Home Living Family/patient expects to be discharged to:: Private residence Living Arrangements: Spouse/significant other Available Help at Discharge: Family;Available 24 hours/day Type of Home: Mobile home   Entrance Stairs-Rails: Can reach both Entrance Stairs-Number of Steps: 1 Home Layout: One level Home Equipment: None      Prior Function Level of Independence: Independent               Hand Dominance        Extremity/Trunk Assessment               Lower Extremity Assessment: Generalized weakness (all musculature functioning)         Communication    Communication: No difficulties  Cognition Arousal/Alertness: Awake/alert Behavior During Therapy: WFL for tasks assessed/performed Overall Cognitive Status: Within Functional Limits for tasks assessed                      General Comments      Exercises        Assessment/Plan    PT Assessment Patient needs continued PT services  PT Diagnosis Difficulty walking;Generalized weakness   PT Problem List Decreased strength;Decreased activity tolerance;Decreased balance;Decreased mobility  PT Treatment Interventions Gait training;Therapeutic exercise;Balance training   PT Goals (Current goals can be found in the Care Plan section) Acute Rehab PT Goals Patient Stated Goal: to be able to walk independently Time For Goal Achievement: 01/06/15 Potential to Achieve Goals: Good    Frequency Min 3X/week   Barriers to discharge   none    Co-evaluation               End of Session Equipment Utilized During Treatment: Gait belt Activity Tolerance: Patient tolerated treatment well Patient left: in bed;with call bell/phone within reach;with bed alarm set Nurse Communication: Mobility status         Time: 1128-1208 PT Time Calculation (min) (ACUTE ONLY): 40 min   Charges:   PT Evaluation $PT Re-evaluation: 1 Procedure     PT G CodesSable Feil 12/30/2014, 12:37 PM

## 2014-12-30 NOTE — Progress Notes (Signed)
Subjective: Interval History: Patient feeling much better today. He doesn't have any nausea vomiting. Leg swelling is also getting better. Objective: Vital signs in last 24 hours: Temp:  [97.5 F (36.4 C)-98 F (36.7 C)] 97.5 F (36.4 C) (01/25 0813) Pulse Rate:  [72-95] 73 (01/25 0813) Resp:  [20] 20 (01/25 0813) BP: (118-132)/(67-93) 118/93 mmHg (01/25 0813) SpO2:  [95 %-97 %] 97 % (01/25 0813) Weight:  [100.9 kg (222 lb 7.1 oz)] 100.9 kg (222 lb 7.1 oz) (01/25 0630) Weight change: -0.7 kg (-1 lb 8.7 oz)  Intake/Output from previous day: 01/24 0701 - 01/25 0700 In: 1330 [P.O.:1080; I.V.:250] Out: 2400 [Urine:2400] Intake/Output this shift: Total I/O In: -  Out: 600 [Urine:600]  General appearance: alert, cooperative and no distress Resp: clear to auscultation bilaterally Cardio: regular rate and rhythm, S1, S2 normal, no murmur, click, rub or gallop GI: soft, non-tender; bowel sounds normal; no masses,  no organomegaly Extremities: edema Trace 1+ edema  Lab Results: No results for input(s): WBC, HGB, HCT, PLT in the last 72 hours. BMET:   Recent Labs  12/29/14 0629 12/30/14 0520  NA 133* 133*  K 4.8 4.6  CL 107 106  CO2 18* 18*  GLUCOSE 140* 111*  BUN 48* 48*  CREATININE 3.96* 4.10*  CALCIUM 8.3* 8.7   No results for input(s): PTH in the last 72 hours. Iron Studies: No results for input(s): IRON, TIBC, TRANSFERRIN, FERRITIN in the last 72 hours.  Studies/Results: Ct Head Wo Contrast  12/28/2014   CLINICAL DATA:  Recent fall with head injury and subarachnoid hemorrhage. History of diabetes, atrial fibrillation, chronic kidney disease and AICD. History of rectal and prostate cancer. Subsequent encounter.  EXAM: CT HEAD WITHOUT CONTRAST  TECHNIQUE: Contiguous axial images were obtained from the base of the skull through the vertex without intravenous contrast.  COMPARISON:  Head CT 12/25/2014 and 12/24/2014.  FINDINGS: There has been interval near-complete  resolution of the previously demonstrated hyperdensity within the quadrigeminal and ambient cisterns. There is no progressive subarachnoid hemorrhage. There is no evidence of acute infarct, hydrocephalus or intraparenchymal hemorrhage. Diffuse vascular calcifications are noted.  The visualized paranasal sinuses, mastoid air cells and middle ears are clear. The calvarium is intact.  IMPRESSION: 1. Interval near-complete resolution of previously demonstrated blood in the basilar cisterns. 2. No new findings.  No evidence of hydrocephalus.   Electronically Signed   By: Camie Patience M.D.   On: 12/28/2014 18:45   Dg Chest Port 1 View  12/29/2014   CLINICAL DATA:  Short of breath  EXAM: PORTABLE CHEST - 1 VIEW  COMPARISON:  11/19/2014  FINDINGS: There is a left chest wall ICD with leads in the right atrial appendage, coronary sinus and right ventricle. There is mild cardiac enlargement. No pleural effusion or edema. No airspace consolidation.  IMPRESSION: 1. No active cardiopulmonary abnormalities.   Electronically Signed   By: Kerby Moors M.D.   On: 12/29/2014 10:14    I have reviewed the patient's current medications.  Assessment/Plan: Problem #1 acute kidney injury: Possibly ATN versus prerenal. His renal function started to decline. Most likely associated with fluid removal. Presently patient is asymptomatic. Problem #2 diabetes: He is on insulin and his blood glucose seems reasonable Problem #3 history of hypertension: His blood pressure is good Problem #4 of atrial fibrillation his heart rate is controlled Problem #5 subarachnoid hemorrhage: Seems to be stable. Problem #6 rectal cancer Problem#7 History of CHF : Patient had 2400 mL of urine output the  last 24 hours. His edema is improving. Plan: We'll continue his Lasix. Patient has this moment doesn't require dialysis. He was going to be discharged will follow him as an outpatient and make a decision. If his renal function does not improve  probably he may need to have an access placed for possible dialysis. He We'll check his basic metabolic panel in the morning.    LOS: 6 days   Christyana Corwin S 12/30/2014,9:21 AM

## 2014-12-30 NOTE — Progress Notes (Signed)
[facility] PROGRESS NOTE  [name] LOV:[id number] DOB: 03-14-1956 DOA: 12/24/2014 PCP: Carlyle Dolly, F, MD  Assessment/Plan: Acute on chronic kidney failure stage III. Improved with IV fluids. Developed volume overload so IV stopped and lasix started. Creatinine slightly worse this am with diuresis. Continues with good urine output. Voided 2400 last 24 hours. Less edema to hands. Continue lasix. Appreciate assistance of nephrology who opin ATN vs prerenal.  plan to follow as outpatient. Hopefull discharge in am.   SAH (subarachnoid hemorrhage): Secondary to mechanical fall in setting of aspirin and Elaquis. Repeat CT after 24 hoursstable with no changes. Neurologic exam remains benign. No focal deficits. PT evaluated and recommended OP PT. Restarted eliquis. Pt complained of headache on 1/23 thus repeat CT was done revealing interval near-complete resolution of blood in basilar cisterns.  Acute encephalopathy Etiology unclear but may be related to mild uremia. CT yesterday showed improvement SAH, oxygen saturation level >90%, somewhat acidotic. Quickly resolved. Bicarb initiated by nephrology. Will monitor  Metabolic acidosis Related to #1. Bicarb po initiated by nephrology. Will monitor.   Abdominal pain: related to constipation. Good results from cathartics.     Diabetes mellitus, type II: Hemoglobin A1c 10.6. good control. Continue half of his Lantus dose and provide sliding scale for optimal control.    PAF (paroxysmal atrial fibrillation): Rate controlled. Resumed eliquis   Obesity: BMI 30.3 nutritional consult    GERD (gastroesophageal reflux disease): Stable at baseline   CAD (coronary artery disease): No chest pain. Initial troponin negative.  Chronic Systolic CHF:  See #1. IVF discontinued. 80mg  lasix daily. Urine output 2418ml last 24 hours.   Wt trends: 95.25kg ->96kg ->97.6kg -> 97.2kg -> 101.6kg-> 100.9kg currently. monitor  Code Status:  full Family Communication: none Disposition Plan: home hopefully tomorrow   Consultants:  nephrology  Procedures:  none  Antibiotics:  none  HPI/Subjective: Sitting on side of bed eating. Denies pain/discomfort. Describe vivid dream where he was going to have back surgery.   Objective: Filed Vitals:   12/30/14 0813  BP: 118/93  Pulse: 73  Temp: 97.5 F (36.4 C)  Resp: 20    Intake/Output Summary (Last 24 hours) at 12/30/14 1014 Last data filed at 12/30/14 0813  Gross per 24 hour  Intake    720 ml  Output   2500 ml  Net  -1780 ml   Filed Weights   12/28/14 0631 12/29/14 0617 12/30/14 0630  Weight: 97.2 kg (214 lb 4.6 oz) 101.6 kg (223 lb 15.8 oz) 100.9 kg (222 lb 7.1 oz)    Exam:   General:  Obese appears comfortable  Cardiovascular: RRR no MGR no LE edema  Respiratory: normal effort BS clear bilaterally but distant  Abdomen: obese soft non-distended. +BS   Musculoskeletal: hands and feet with trace -1+ edema.    Data Reviewed: Basic Metabolic Panel:  Recent Labs Lab 12/26/14 0528 12/26/14 0950 12/27/14 0534 12/28/14 0706 12/29/14 0629 12/30/14 0520  NA 133*  --  137 134* 133* 133*  K 4.3  --  4.2 4.7 4.8 4.6  CL 104  --  108 107 107 106  CO2 21  --  22 18* 18* 18*  GLUCOSE 134*  --  118* 159* 140* 111*  BUN 57*  --  52* 49* 48* 48*  CREATININE 5.00*  --  4.60* 4.24* 3.96* 4.10*  CALCIUM 8.7 9.1 8.8 8.5 8.3* 8.7  MG  --   --   --   --  1.8  --  PHOS  --   --  3.0  --   --   --    Liver Function Tests:  Recent Labs Lab 12/24/14 0414  AST 24  ALT 18  ALKPHOS 77  BILITOT 0.8  PROT 7.6  ALBUMIN 3.2*   No results for input(s): LIPASE, AMYLASE in the last 168 hours. No results for input(s): AMMONIA in the last 168 hours. CBC:  Recent Labs Lab 12/24/14 0414 12/25/14 0540  WBC 9.2 8.5  NEUTROABS 8.0*  --   HGB 12.1* 11.2*  HCT 36.0* 32.9*  MCV 88.2 89.2  PLT 171 156   Cardiac Enzymes:  Recent Labs Lab 12/24/14 0414  12/24/14 1212 12/24/14 1843  TROPONINI 0.03 0.03 0.03   BNP (last 3 results)  Recent Labs  07/18/14 0555 09/27/14 0523 11/19/14 0745  PROBNP 1116.0* 9164.0* 5233.0*   CBG:  Recent Labs Lab 12/29/14 1215 12/29/14 1351 12/29/14 1635 12/29/14 2129 12/30/14 0741  GLUCAP 165* 186* 204* 150* 105*    No results found for this or any previous visit (from the past 240 hour(s)).   Studies: Ct Head Wo Contrast  12/28/2014   CLINICAL DATA:  Recent fall with head injury and subarachnoid hemorrhage. History of diabetes, atrial fibrillation, chronic kidney disease and AICD. History of rectal and prostate cancer. Subsequent encounter.  EXAM: CT HEAD WITHOUT CONTRAST  TECHNIQUE: Contiguous axial images were obtained from the base of the skull through the vertex without intravenous contrast.  COMPARISON:  Head CT 12/25/2014 and 12/24/2014.  FINDINGS: There has been interval near-complete resolution of the previously demonstrated hyperdensity within the quadrigeminal and ambient cisterns. There is no progressive subarachnoid hemorrhage. There is no evidence of acute infarct, hydrocephalus or intraparenchymal hemorrhage. Diffuse vascular calcifications are noted.  The visualized paranasal sinuses, mastoid air cells and middle ears are clear. The calvarium is intact.  IMPRESSION: 1. Interval near-complete resolution of previously demonstrated blood in the basilar cisterns. 2. No new findings.  No evidence of hydrocephalus.   Electronically Signed   By: Camie Patience M.D.   On: 12/28/2014 18:45   Dg Chest Port 1 View  12/29/2014   CLINICAL DATA:  Short of breath  EXAM: PORTABLE CHEST - 1 VIEW  COMPARISON:  11/19/2014  FINDINGS: There is a left chest wall ICD with leads in the right atrial appendage, coronary sinus and right ventricle. There is mild cardiac enlargement. No pleural effusion or edema. No airspace consolidation.  IMPRESSION: 1. No active cardiopulmonary abnormalities.   Electronically Signed    By: Kerby Moors M.D.   On: 12/29/2014 10:14    Scheduled Meds: . amiodarone  200 mg Oral Daily  . apixaban  5 mg Oral BID  . citalopram  20 mg Oral Daily  . digoxin  0.125 mg Oral QODAY  . docusate sodium  100 mg Oral BID  . feeding supplement (GLUCERNA SHAKE)  237 mL Oral BID BM  . furosemide  80 mg Oral Daily  . insulin aspart  0-15 Units Subcutaneous TID WC  . insulin aspart  0-5 Units Subcutaneous QHS  . insulin glargine  12 Units Subcutaneous QHS  . pantoprazole  40 mg Oral Daily  . ranolazine  1,000 mg Oral BID  . rosuvastatin  40 mg Oral QHS  . sodium bicarbonate  650 mg Oral BID  . sodium chloride  3 mL Intravenous Q12H   Continuous Infusions:   Principal Problem:   SAH (subarachnoid hemorrhage) Active Problems:   Diabetes mellitus, type II  Essential hypertension   PAF (paroxysmal atrial fibrillation)   Obesity   Acute-on-chronic renal failure   DM type 2, uncontrolled, with renal complications   GERD (gastroesophageal reflux disease)   CAD (coronary artery disease)   Acute renal failure   Dehydration   Chronic systolic congestive heart failure   Acute renal failure syndrome   Abdominal pain, generalized    Time spent: 35 minutes.     [facility] Pager [phone]. If 7PM-7AM, please contact night-coverage at [email or web address], password [facility] 12/30/2014, 10:14 AM  LOS: 6 days

## 2014-12-31 DIAGNOSIS — W19XXXA Unspecified fall, initial encounter: Secondary | ICD-10-CM | POA: Insufficient documentation

## 2014-12-31 LAB — BASIC METABOLIC PANEL
Anion gap: 9 (ref 5–15)
BUN: 54 mg/dL — AB (ref 6–23)
CO2: 22 mmol/L (ref 19–32)
CREATININE: 4.34 mg/dL — AB (ref 0.50–1.35)
Calcium: 8.7 mg/dL (ref 8.4–10.5)
Chloride: 103 mmol/L (ref 96–112)
GFR calc Af Amer: 16 mL/min — ABNORMAL LOW (ref >90)
GFR, EST NON AFRICAN AMERICAN: 14 mL/min — AB (ref >90)
Glucose, Bld: 121 mg/dL — ABNORMAL HIGH (ref 70–99)
Potassium: 4.6 mmol/L (ref 3.5–5.1)
SODIUM: 134 mmol/L — AB (ref 135–145)

## 2014-12-31 LAB — GLUCOSE, CAPILLARY
GLUCOSE-CAPILLARY: 169 mg/dL — AB (ref 70–99)
Glucose-Capillary: 111 mg/dL — ABNORMAL HIGH (ref 70–99)
Glucose-Capillary: 136 mg/dL — ABNORMAL HIGH (ref 70–99)
Glucose-Capillary: 107 mg/dL — ABNORMAL HIGH (ref 70–99)

## 2014-12-31 LAB — CBC
HCT: 30.2 % — ABNORMAL LOW (ref 39.0–52.0)
Hemoglobin: 9.9 g/dL — ABNORMAL LOW (ref 13.0–17.0)
MCH: 29.6 pg (ref 26.0–34.0)
MCHC: 32.8 g/dL (ref 30.0–36.0)
MCV: 90.4 fL (ref 78.0–100.0)
PLATELETS: 175 10*3/uL (ref 150–400)
RBC: 3.34 MIL/uL — AB (ref 4.22–5.81)
RDW: 14.6 % (ref 11.5–15.5)
WBC: 7.4 10*3/uL (ref 4.0–10.5)

## 2014-12-31 MED ORDER — SIMETHICONE 40 MG/0.6ML PO SUSP
ORAL | Status: AC
  Filled 2014-12-31: qty 1.8

## 2014-12-31 MED ORDER — INSULIN ASPART 100 UNIT/ML ~~LOC~~ SOLN
3.0000 [IU] | Freq: Three times a day (TID) | SUBCUTANEOUS | Status: DC
  Administered 2014-12-31 – 2015-01-06 (×14): 3 [IU] via SUBCUTANEOUS

## 2014-12-31 MED ORDER — FUROSEMIDE 40 MG PO TABS
40.0000 mg | ORAL_TABLET | Freq: Every day | ORAL | Status: DC
  Administered 2015-01-01 – 2015-01-06 (×6): 40 mg via ORAL
  Filled 2014-12-31 (×6): qty 1

## 2014-12-31 MED ORDER — METOPROLOL SUCCINATE ER 50 MG PO TB24
50.0000 mg | ORAL_TABLET | Freq: Every day | ORAL | Status: DC
  Administered 2014-12-31 – 2015-01-04 (×4): 50 mg via ORAL
  Filled 2014-12-31 (×5): qty 1

## 2014-12-31 NOTE — Progress Notes (Signed)
UR chart review completed.  

## 2014-12-31 NOTE — Progress Notes (Signed)
Physical Therapy Treatment Patient Details Name: [name] MRN: [id number] DOB: 12/08/1955 Today's Date: 12/31/2014    History of Present Illness Pt was admitted on 12-24-14 secondary to acute on chronic renal failure and a fall at home which caused a sub arachnoid hemmorhage.  He has multiple medical problems to include an ischemic cardiomyopathy with an EF of 15%, DM, Afib.  He was evaluated by PT on 12-25-14 and found to have only a mild balance deficit for which OP PT was recommended.  We are asked to reassess pt today as he is reporting severe LE weakness and dizziness.    PT Comments    Pt reports feeling better overall today but his balance remains poor.  He states that his "dizziness" is gone but his vision is "blurry".  His LE strength is found to be WNL with poor endurance.  His sitting balance is good but standing balance is zero.  When he stands it is as if he is standing on hot coals.  He rapidly alternates lifting feet up off of the floor and continuously falls in different directions.  Pivot transfer from chair to bed is extremely difficult, losing his balance throughout the transfer.  I have discussed this with Robert Carrel, NP and wonder if he might benefit from a Neuro consult.  Per pt, his balance has never been this bad although it has been a problem for quite some time.  Unless this problem is resolved, he will need SNF.  Follow Up Recommendations  SNF     Equipment Recommendations    none   Recommendations for Other Services  none     Precautions / Restrictions Precautions Precautions: Fall Restrictions Weight Bearing Restrictions: No    Mobility  Bed Mobility Overal bed mobility: Modified Independent             General bed mobility comments: Use of bed rails  Transfers Overall transfer level: Needs assistance Equipment used: None Transfers: Stand Pivot Transfers;Sit to/from Stand Sit to Stand: Min assist Stand pivot transfers: Mod assist        General transfer comment: With sitting, pt states that he has "blurriness" regarding his vision...Marland Kitchenhe is not really experiencing any dizziness per his report.  With standing, he only needs min assist to stand but he is unable to maintain stance.  It is as if he is standing on hot coals, picking one foot up after the other continually losing his balance.  Ambulation/Gait Ambulation/Gait assistance:  (unable to ambulate as he is unable to maintain stance)               Stairs            Wheelchair Mobility    Modified Rankin (Stroke Patients Only)       Balance           Standing balance support: Bilateral upper extremity supported Standing balance-Leahy Scale: Zero Standing balance comment: pt unable to maintain stance even while holding onto a walker                    Cognition Arousal/Alertness: Awake/alert Behavior During Therapy: WFL for tasks assessed/performed Overall Cognitive Status: Within Functional Limits for tasks assessed                      Exercises General Exercises - Lower Extremity Long Arc Quad: AROM;Both;10 reps;Seated Hip ABduction/ADduction: AROM;Seated;5 reps Hip Flexion/Marching: AROM;Both;5 reps;Seated    General Comments  Pertinent Vitals/Pain Pain Assessment: No/denies pain    Home Living                      Prior Function            PT Goals (current goals can now be found in the care plan section) Progress towards PT goals: Not progressing toward goals - comment (continues with extremely poor balance)    Frequency  Min 3X/week    PT Plan Discharge plan needs to be updated    Co-evaluation             End of Session Equipment Utilized During Treatment: Gait belt Activity Tolerance: Patient limited by fatigue Patient left: in bed;with call bell/phone within reach;with bed alarm set     Time: 1312-1340 PT Time Calculation (min) (ACUTE ONLY): 28 min  Charges:   $Therapeutic Exercise: 8-22 mins $Therapeutic Activity: 8-22 mins                    G Codes:      [name] Jan 09, 2015, 1:55 PM

## 2014-12-31 NOTE — Progress Notes (Signed)
OT Screen Note  Patient Details Name: [name] MRN: [id number] DOB: Jan 11, 1956   Cancelled Treatment:    Reason Eval/Treat Not Completed: OT screened, no needs identified, will sign off. Patient experiencing increased dizziness more than likely due to orthostatic hypotension. ADL performance is at baseline: [place], OTR/L,CBIS  [phone]  12/31/2014, 10:31 AM

## 2014-12-31 NOTE — Progress Notes (Signed)
Subjective: Interval History: Patient complains of dizziness when he walks. No sure whether this is because orthostatic hypotension. Objective: Vital signs in last 24 hours: Temp:  [97.7 F (36.5 C)-98.3 F (36.8 C)] 98.3 F (36.8 C) (01/26 0531) Pulse Rate:  [70-73] 70 (01/26 0531) Resp:  [20-22] 22 (01/26 0531) BP: (116-132)/(63-73) 122/63 mmHg (01/26 0531) SpO2:  [96 %-98 %] 98 % (01/26 0531) Weight:  [98.6 kg (217 lb 6 oz)] 98.6 kg (217 lb 6 oz) (01/26 0531) Weight change: -2.3 kg (-5 lb 1.1 oz)  Intake/Output from previous day: 01/25 0701 - 01/26 0700 In: 720 [P.O.:720] Out: 4025 [Urine:4025] Intake/Output this shift:    General appearance: alert, cooperative and no distress Resp: clear to auscultation bilaterally Cardio: regular rate and rhythm, S1, S2 normal, no murmur, click, rub or gallop GI: soft, non-tender; bowel sounds normal; no masses,  no organomegaly Extremities: edema Trace 1+ edema  Lab Results:  Recent Labs  12/31/14 0524  WBC 7.4  HGB 9.9*  HCT 30.2*  PLT 175   BMET:   Recent Labs  12/30/14 0520 12/31/14 0524  NA 133* 134*  K 4.6 4.6  CL 106 103  CO2 18* 22  GLUCOSE 111* 121*  BUN 48* 54*  CREATININE 4.10* 4.34*  CALCIUM 8.7 8.7   No results for input(s): PTH in the last 72 hours. Iron Studies: No results for input(s): IRON, TIBC, TRANSFERRIN, FERRITIN in the last 72 hours.  Studies/Results: Dg Chest Port 1 View  12/29/2014   CLINICAL DATA:  Short of breath  EXAM: PORTABLE CHEST - 1 VIEW  COMPARISON:  11/19/2014  FINDINGS: There is a left chest wall ICD with leads in the right atrial appendage, coronary sinus and right ventricle. There is mild cardiac enlargement. No pleural effusion or edema. No airspace consolidation.  IMPRESSION: 1. No active cardiopulmonary abnormalities.   Electronically Signed   By: Kerby Moors M.D.   On: 12/29/2014 10:14    I have reviewed the patient's current medications.  Assessment/Plan: Problem #1  acute kidney injury: Possibly ATN versus prerenal. His renal function continue to decline. This possibly from diuretic use. Problem #2 diabetes: He is on insulin and his blood glucose seems reasonable Problem #3 history of hypertension: His blood pressure seems to be reasonable. However patient complains of dizziness mainly when he walks around. Problem #4 of atrial fibrillation his heart rate is controlled Problem #5 subarachnoid hemorrhage: Seems to be stable. Problem #6 rectal cancer Problem#7 History of CHF : Patient had 4000 mL of urine output. His anasarca seems to be progressively improving. Problem #8 metabolic acidosis: Patient also didn't bicarbonate and his CO2 is 22 has improved. Problem #9 anemia: His hemoglobin is below our target goal. Plan: We'll decrease Lasix to 40 mg by mouth once a day. We'll check iron studies in the morning. We'll check his basic metabolic panel in the morning.    LOS: 7 days   Atisha Hamidi S 12/31/2014,9:15 AM

## 2014-12-31 NOTE — Clinical Social Work Note (Signed)
CSW attempted to complete assessment for SNF with pt. When CSW entered room, pt indicated that he was not interested in talking right now. Will follow up in AM.  Benay Pike, [place]

## 2014-12-31 NOTE — Progress Notes (Signed)
[facility] PROGRESS NOTE  [name] BHA:[id number] DOB: December 30, 1955 DOA: 12/24/2014 PCP: Carlyle Dolly, F, MD  Assessment/Plan: Acute on chronic kidney failure stage III.  Provided with fluids and function improved. Developed volume overload so IV stopped and lasix started. Creatinine slightly worse this am with diuresis. Continues with good urine output. Voided 4093ml last 24 hours. Less edema to hands. Continue lasix but adjusted per nephrology. Appreciate assistance of nephrology who opin ATN vs prerenal. plan to follow as outpatient. Hopefull discharge in am.   SAH (subarachnoid hemorrhage): Secondary to mechanical fall in setting of aspirin and Elaquis. Repeat CT after 24 hoursstable with no changes. Neurologic exam remains benign. No focal deficits. PT evaluated and recommended OP PT. Restarted eliquis. Pt complained of headache on 1/23 thus repeat CT was done revealing interval near-complete resolution of blood in basilar cisterns.  Acute encephalopathy  Improved this am. Etiology unclear but may be related to mild uremia. CT showed improvement SAH, oxygen saturation level >90%, acidosis resolving.  Continue bicarb initiated by nephrology.   Metabolic acidosis Related to #1. Bicarb po initiated by nephrology. improving   Abdominal pain: related to constipation. Good results from cathartics.    Diabetes mellitus, type II: Hemoglobin A1c 10.6. good control. Continue half of his Lantus dose and provide sliding scale for optimal control.    PAF (paroxysmal atrial fibrillation): Rate controlled. Resumed eliquis   Obesity: BMI 30.3 nutritional consult    GERD (gastroesophageal reflux disease): Stable at baseline   CAD (coronary artery disease): No chest pain. Initial troponin negative.  1. Chronic Systolic CHF: See #1. IVF discontinued. 80mg  lasix daily. Urine output 4072ml last 24 hours.   Code Status: full Family Communication: wife at  bedside Disposition Plan: home tomorrow   Consultants:  nephrology  Procedures:  none  Antibiotics:  none  HPI/Subjective: Reports feeling better. Continues with mild confusion at night  Objective: Filed Vitals:   12/31/14 0531  BP: 122/63  Pulse: 70  Temp: 98.3 F (36.8 C)  Resp: 22    Intake/Output Summary (Last 24 hours) at 12/31/14 1132 Last data filed at 12/31/14 0535  Gross per 24 hour  Intake    480 ml  Output   3425 ml  Net  -2945 ml   Filed Weights   12/29/14 0617 12/30/14 0630 12/31/14 0531  Weight: 101.6 kg (223 lb 15.8 oz) 100.9 kg (222 lb 7.1 oz) 98.6 kg (217 lb 6 oz)    Exam:   General:  Obese NAD  Cardiovascular: RRR no m/g/r no LE edema  Respiratory: normal effort BS clear to auscultation bilaterally  Abdomen: soft + BS ostomy intact  Musculoskeletal: less edema to hands   Data Reviewed: Basic Metabolic Panel:  Recent Labs Lab 12/27/14 0534 12/28/14 0706 12/29/14 0629 12/30/14 0520 12/31/14 0524  NA 137 134* 133* 133* 134*  K 4.2 4.7 4.8 4.6 4.6  CL 108 107 107 106 103  CO2 22 18* 18* 18* 22  GLUCOSE 118* 159* 140* 111* 121*  BUN 52* 49* 48* 48* 54*  CREATININE 4.60* 4.24* 3.96* 4.10* 4.34*  CALCIUM 8.8 8.5 8.3* 8.7 8.7  MG  --   --  1.8  --   --   PHOS 3.0  --   --   --   --    Liver Function Tests: No results for input(s): AST, ALT, ALKPHOS, BILITOT, PROT, ALBUMIN in the last 168 hours. No results for input(s): LIPASE, AMYLASE in the last 168 hours. No results for  input(s): AMMONIA in the last 168 hours. CBC:  Recent Labs Lab 12/25/14 0540 12/31/14 0524  WBC 8.5 7.4  HGB 11.2* 9.9*  HCT 32.9* 30.2*  MCV 89.2 90.4  PLT 156 175   Cardiac Enzymes:  Recent Labs Lab 12/24/14 1212 12/24/14 1843  TROPONINI 0.03 0.03   BNP (last 3 results)  Recent Labs  07/18/14 0555 09/27/14 0523 11/19/14 0745  PROBNP 1116.0* 9164.0* 5233.0*   CBG:  Recent Labs Lab 12/30/14 0741 12/30/14 1141 12/30/14 1707  12/30/14 2116 12/31/14 0802  GLUCAP 105* 132* 162* 133* 111*    No results found for this or any previous visit (from the past 240 hour(s)).   Studies: No results found.  Scheduled Meds: . amiodarone  200 mg Oral Daily  . apixaban  5 mg Oral BID  . citalopram  20 mg Oral Daily  . digoxin  0.125 mg Oral QODAY  . docusate sodium  100 mg Oral BID  . feeding supplement (GLUCERNA SHAKE)  237 mL Oral BID BM  . [START ON 01/01/2015] furosemide  40 mg Oral Daily  . insulin aspart  0-15 Units Subcutaneous TID WC  . insulin aspart  0-5 Units Subcutaneous QHS  . insulin aspart  3 Units Subcutaneous TID WC  . insulin glargine  12 Units Subcutaneous QHS  . metoprolol succinate  50 mg Oral Daily  . pantoprazole  40 mg Oral Daily  . ranolazine  1,000 mg Oral BID  . rosuvastatin  40 mg Oral QHS  . simethicone      . sodium bicarbonate  650 mg Oral BID  . sodium chloride  3 mL Intravenous Q12H   Continuous Infusions:   Principal Problem:   SAH (subarachnoid hemorrhage) Active Problems:   Diabetes mellitus, type II   Essential hypertension   PAF (paroxysmal atrial fibrillation)   Obesity   Acute-on-chronic renal failure   DM type 2, uncontrolled, with renal complications   GERD (gastroesophageal reflux disease)   CAD (coronary artery disease)   Acute renal failure   Dehydration   Chronic systolic congestive heart failure   Acute renal failure syndrome   Abdominal pain, generalized   Metabolic acidosis   Acute encephalopathy    Time spent: [facility] Pager [phone]. If 7PM-7AM, please contact night-coverage at [email or web address], password [facility] 12/31/2014, 11:32 AM  LOS: 7 days

## 2014-12-31 NOTE — Progress Notes (Signed)
NUTRITION FOLLOW-UP  59 y/o male with hx of ischemic cardiomyopathy, Diabetes, CAD, CKD stage III and A. Fib.  Wt Readings from Last 15 Encounters:  12/24/14 211 lb (95.709 kg)  12/19/14 211 lb (95.709 kg)  12/10/14 214 lb (97.07 kg)  11/20/14 211 lb 3.2 oz (95.8 kg)  11/12/14 219 lb 6.4 oz (99.519 kg)  09/29/14 215 lb 12.8 oz (97.886 kg)  09/10/14 218 lb 1.9 oz (98.939 kg)  08/30/14 219 lb 14.4 oz (99.746 kg)  08/06/14 218 lb 6.4 oz (99.066 kg)  08/02/14 220 lb (99.791 kg)  07/24/14 213 lb (96.616 kg)  07/19/14 210 lb 5.1 oz (95.4 kg)  07/02/14 210 lb (95.255 kg)  06/06/14 217 lb (98.431 kg)  06/05/14 215 lb 12.8 oz (97.886 kg)    Body mass index is 30.28 kg/(m^2). Patient meets criteria for obesity class I based on current BMI. Usual body weight 210-220# past 6 months. No significant wt changes have been noted.  Pt has been eating 50-75% of meals.   Current diet order is CHO Modified. Labs and medications reviewed.   No intervention warranted at this time.   Wynona Dove, MS Dietetic Intern Pager: [phone]

## 2015-01-01 ENCOUNTER — Inpatient Hospital Stay (HOSPITAL_COMMUNITY): Payer: Medicare Other

## 2015-01-01 DIAGNOSIS — E1165 Type 2 diabetes mellitus with hyperglycemia: Secondary | ICD-10-CM

## 2015-01-01 DIAGNOSIS — I480 Paroxysmal atrial fibrillation: Secondary | ICD-10-CM

## 2015-01-01 DIAGNOSIS — G9340 Encephalopathy, unspecified: Secondary | ICD-10-CM

## 2015-01-01 DIAGNOSIS — E1129 Type 2 diabetes mellitus with other diabetic kidney complication: Secondary | ICD-10-CM

## 2015-01-01 LAB — DIGOXIN LEVEL: Digoxin Level: 1.2 ng/mL (ref 0.8–2.0)

## 2015-01-01 LAB — BASIC METABOLIC PANEL
ANION GAP: 9 (ref 5–15)
BUN: 54 mg/dL — AB (ref 6–23)
CALCIUM: 9 mg/dL (ref 8.4–10.5)
CHLORIDE: 100 mmol/L (ref 96–112)
CO2: 24 mmol/L (ref 19–32)
Creatinine, Ser: 4.47 mg/dL — ABNORMAL HIGH (ref 0.50–1.35)
GFR calc Af Amer: 15 mL/min — ABNORMAL LOW (ref >90)
GFR calc non Af Amer: 13 mL/min — ABNORMAL LOW (ref >90)
Glucose, Bld: 129 mg/dL — ABNORMAL HIGH (ref 70–99)
Potassium: 4.3 mmol/L (ref 3.5–5.1)
Sodium: 133 mmol/L — ABNORMAL LOW (ref 135–145)

## 2015-01-01 LAB — URINALYSIS, ROUTINE W REFLEX MICROSCOPIC
BILIRUBIN URINE: NEGATIVE
Glucose, UA: NEGATIVE mg/dL
Ketones, ur: NEGATIVE mg/dL
LEUKOCYTES UA: NEGATIVE
Nitrite: NEGATIVE
PROTEIN: NEGATIVE mg/dL
Specific Gravity, Urine: 1.015 (ref 1.005–1.030)
Urobilinogen, UA: 0.2 mg/dL (ref 0.0–1.0)
pH: 5.5 (ref 5.0–8.0)

## 2015-01-01 LAB — BLOOD GAS, ARTERIAL
Acid-base deficit: 1.7 mmol/L (ref 0.0–2.0)
Bicarbonate: 21.7 mEq/L (ref 20.0–24.0)
Drawn by: 234301
FIO2: 21 %
O2 SAT: 93.7 %
Patient temperature: 37
TCO2: 19.1 mmol/L (ref 0–100)
pCO2 arterial: 31.9 mmHg — ABNORMAL LOW (ref 35.0–45.0)
pH, Arterial: 7.447 (ref 7.350–7.450)
pO2, Arterial: 73.1 mmHg — ABNORMAL LOW (ref 80.0–100.0)

## 2015-01-01 LAB — GLUCOSE, CAPILLARY
GLUCOSE-CAPILLARY: 117 mg/dL — AB (ref 70–99)
GLUCOSE-CAPILLARY: 95 mg/dL (ref 70–99)
Glucose-Capillary: 151 mg/dL — ABNORMAL HIGH (ref 70–99)
Glucose-Capillary: 104 mg/dL — ABNORMAL HIGH (ref 70–99)

## 2015-01-01 LAB — HEPATIC FUNCTION PANEL
ALK PHOS: 69 U/L (ref 39–117)
ALT: 23 U/L (ref 0–53)
AST: 47 U/L — ABNORMAL HIGH (ref 0–37)
Albumin: 2.8 g/dL — ABNORMAL LOW (ref 3.5–5.2)
Bilirubin, Direct: 0.4 mg/dL (ref 0.0–0.5)
Indirect Bilirubin: 0.3 mg/dL (ref 0.3–0.9)
Total Bilirubin: 0.7 mg/dL (ref 0.3–1.2)
Total Protein: 7.2 g/dL (ref 6.0–8.3)

## 2015-01-01 LAB — TSH: TSH: 3.652 u[IU]/mL (ref 0.350–4.500)

## 2015-01-01 LAB — URINE MICROSCOPIC-ADD ON

## 2015-01-01 LAB — AMMONIA: Ammonia: 34 umol/L — ABNORMAL HIGH (ref 11–32)

## 2015-01-01 LAB — CK: CK TOTAL: 77 U/L (ref 7–232)

## 2015-01-01 LAB — TROPONIN I: Troponin I: 0.03 ng/mL (ref <0.031)

## 2015-01-01 MED ORDER — HALOPERIDOL LACTATE 5 MG/ML IJ SOLN
2.5000 mg | Freq: Four times a day (QID) | INTRAMUSCULAR | Status: AC | PRN
  Administered 2015-01-01: 2.5 mg via INTRAVENOUS
  Filled 2015-01-01: qty 1

## 2015-01-01 MED ORDER — LORAZEPAM 2 MG/ML IJ SOLN: 1.0000 mg | Freq: Once | INTRAMUSCULAR | Status: DC

## 2015-01-01 MED ORDER — LORAZEPAM 2 MG/ML IJ SOLN
1.0000 mg | Freq: Once | INTRAMUSCULAR | Status: AC
  Administered 2015-01-01: 1 mg via INTRAVENOUS
  Filled 2015-01-01: qty 1

## 2015-01-01 NOTE — Progress Notes (Signed)
Pt experiencing hallucinations and acute onset of restlessness and agitation. MD notified and made aware. Received Telephone order with verbal readback for  Ativan IV 1 mg. Informed MD of results of EKG. Vitals signs including blood pressure and pulse rechecked. B/P 106/62 and pulse 72. MD aware. Received telephone order with readback for Troponin x 1 only. Will continue to monitor patient at this time.

## 2015-01-01 NOTE — Clinical Social Work Placement (Signed)
Clinical Social Work Department CLINICAL SOCIAL WORK PLACEMENT NOTE 01/01/2015  Patient:  [name], [name]  Account Number:  [id number] Admit date:  12/24/2014  Clinical Social Worker:  Benay Pike, LCSW  Date/time:  01/01/2015 11:57 AM  Clinical Social Work is seeking post-discharge placement for this patient at the following level of care:   [place]   (*CSW will update this form in Epic as items are completed)   01/01/2015  Patient/family provided with [facility] Department of Clinical Social Work's list of facilities offering this level of care within the geographic area requested by the patient (or if unable, by the patient's family).  01/01/2015  Patient/family informed of their freedom to choose among providers that offer the needed level of care, that participate in Medicare, Medicaid or managed care program needed by the patient, have an available bed and are willing to accept the patient.  01/01/2015  Patient/family informed of [facility]' ownership interest in [facility], as well as of the fact that they are under no obligation to receive care at this facility.  PASARR submitted to EDS on 01/01/2015 PASARR number received on 01/01/2015  FL2 transmitted to all facilities in geographic area requested by pt/family on  01/01/2015 FL2 transmitted to all facilities within larger geographic area on   Patient informed that his/her managed care company has contracts with or will negotiate with  certain facilities, including the following:     Patient/family informed of bed offers received:   Patient chooses bed at  Physician recommends and patient chooses bed at    Patient to be transferred to  on   Patient to be transferred to facility by  Patient and family notified of transfer on  Name of family member notified:    The following physician request were entered in Epic:   Additional Comments:  Benay Pike, [place]

## 2015-01-01 NOTE — Consult Note (Signed)
Robert A. Merlene Laughter, MD     [email or web address]          [name] is an 59 y.o. male.   ASSESSMENT/PLAN: Acute encephalopathy with agitation. Undoubtedly, this is a toxic metabolic phenomenon. I acute renal failure is most likely culprit. The uremia however is causing the patient's medications not to be cleared and these medications are building up in the patient's system. Two medications are particularly concerning. They are Daniel. RANEXA has been reported to cause renal failure, confusion and tremors all of which the patient has. Celexa is known to cause tremor and confusion especially in the setting of acute renal failure. I believe we should discontinue both of these medications. Additional labs will be obtained. An EEG will be obtained. Repeat CT scan will also be obtained.  The patient is a 59 year old man who presented to the hospital after a fall. It appears she has been having somewhat increasing weakness. The patient presented to the hospital 10 days after he underwent an elective colonoscopy. It appears she has had reducing appetite since the procedure. During evaluation he was noted to have a significantly elevated creatinine of close to 6. His baseline creatinine is about 2. The patient was lucid during the initial evaluation and the subsequent days. However, he has become progressively confused, agitated and significant visual hallucinations at times. It appears that the symptoms are worse at nighttime but he clearly has daytime symptoms. The patient did have initial CT scan showed a small subarachnoid hemorrhage. This was deemed to be nonsurgical. Repeat imaging 3 days ago shows involution of the hemorrhage with nothing else acute seen. The review of systems is limited given the confusion.  GENERAL: He is resting in bed but somewhat agitated and restless.   HEENT: Supple. Atraumatic normocephalic.   ABDOMEN: soft  EXTREMITIES: No edema    BACK: Normal.  SKIN: Normal by inspection.    MENTAL STATUS: On entering the room, the patient is talking to people who are not there. Speech is nonsensical. He does seem to be lucid however doing most of the evaluation. He knows the season the hospital. He does follow commands well. Speech is normal.  CRANIAL NERVES: Pupils are equal, round and reactive to light and accommodation; extra ocular movements are full, there is no significant nystagmus; visual fields are full; upper and lower facial muscles are normal in strength and symmetric, there is no flattening of the nasolabial folds; tongue is midline; uvula is midline; shoulder elevation is normal.  MOTOR: Normal tone, bulk and strength; no pronator drift.  COORDINATION: Left finger to nose is normal, right finger to nose is normal, No rest tremor; no intention tremor; no postural tremor; no bradykinesia. However, the patient has frequent body jerks and twitches all over. These are not fasciculations but quit jerky movements and fidgets.  REFLEXES: Deep tendon reflexes are symmetrical and normal. Babinski reflexes are flexor bilaterally.   SENSATION: Normal to light touch.    Blood pressure 118/75, pulse 70, temperature 98 F (36.7 C), temperature source Oral, resp. rate 20, height $RemoveBe'5\' 10"'[id number]$  (1.778 m), weight 98.3 kg (216 lb 11.4 oz), SpO2 100 %.  Past Medical History  Diagnosis Date  . Essential hypertension, benign   . Type 2 diabetes mellitus   . CAD (coronary artery disease)     a. BMS to LAD 2001 at [facility] b. PTCA/atherectomy ramus and BMS to LAD 2009  . Chronic systolic heart failure     a. 2D  ECHO: 03/01/2014 EF 15-20%, severe, diffuse hypsokinesis. Mod LV dilation. Mod LVH, indeterminate diastolic dysfxn (poor study), high vent filling pressures, abnormal septal wall motion and dyssynergy c/w RV pacing, mild MR, mod LA dilation, RV systolic function mod reduced, mild TR, PA pressure 42.   Marland Kitchen GERD (gastroesophageal reflux  disease)   . Cardiomyopathy, ischemic     a. BIV ICD St. Jude, LVEF 23%  . Paroxysmal atrial fibrillation     a. on amiodarone, digoxin and Eliquis  . Adenocarcinoma of rectum     a. 2008  . Prostate cancer     a. s/p seed implants with chemo and radiation  . 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 St Jude  . TIA (transient ischemic attack)   . OSA on CPAP   . HLD (hyperlipidemia)   . Orthostatic hypotension   . Dizziness     a. chronic. Admission for this 07/18/2014  . History of falling July 2015    due to dizziness from medications  . CHF (congestive heart failure)   . Renal insufficiency     Past Surgical History  Procedure Laterality Date  . Internal defibrillator and pacemaker  2010    x2 St. Jude device  . Abdominal and perineal resection of rectum with total mesorectal excision  10/04/2007  . Colonoscopy      05/17/2007. IMPRESSION: Semilunar, apple-core neoplasm low in the rectum (palpable on digital rectal exam) beginning at 5 cm and corkscrewing up 5 cm in length. This was a low rectal lesion consistent with colorectal carcinoma. It was biosied multiple times. The upstream colon all the way to the cecum appeared normal. Recommendations: Followup on path. Surgical Consultation   . Colonoscopy  09/14/2011    Dr. Gala Romney: via colostomy, Single pedunculated benign inflammatory polyp. Due for surveillance Oct 2015  . Colostomy    . Colonoscopy N/A 07/02/2014    Procedure: COLONOSCOPY;  Surgeon: Daneil Dolin, MD;  [facility];  Service: Endoscopy;  Laterality: N/A;  7:30 / COLONOSCOPY THRU COLOSTOMY  . Esophagogastroduodenoscopy N/A 07/02/2014    Procedure: ESOPHAGOGASTRODUODENOSCOPY (EGD);  Surgeon: Daneil Dolin, MD;  [facility]: [facility];  Service: Endoscopy;  Laterality: N/A;  7:30  . Savory dilation N/A 07/02/2014    Procedure: SAVORY DILATION;  Surgeon: Daneil Dolin, MD;  [facility]: [facility];  Service: Endoscopy;  Laterality:  N/A;  7:30  . Maloney dilation N/A 07/02/2014    Procedure: Venia Minks DILATION;  Surgeon: Daneil Dolin, MD;  [facility]: [facility];  Service: Endoscopy;  Laterality: N/A;  7:30  . Portacath placement  06/2007    "removed ~ 1 yr later"  . Cardiac catheterization  08/2001  . Coronary angioplasty with stent placement  2001; ~ 2006    "1 + 1"   . Left heart catheterization with coronary angiogram N/A 07/13/2013    Procedure: LEFT HEART CATHETERIZATION WITH CORONARY ANGIOGRAM;  Surgeon: Lorretta Harp, MD;  Location: [facility] CATH LAB;  Service: Cardiovascular;  Laterality: N/A;  . Colonoscopy N/A 12/11/2014    Procedure: COLONOSCOPY VIA COLOSTOMY;  Surgeon: Daneil Dolin, MD;  [facility];  Service: Endoscopy;  Laterality: N/A;  730   . Esophagogastroduodenoscopy N/A 12/11/2014    Procedure: ESOPHAGOGASTRODUODENOSCOPY (EGD);  Surgeon: Daneil Dolin, MD;  [facility];  Service: Endoscopy;  Laterality: N/A;    Family History  Problem Relation Age of Onset  . Colon cancer Mother 2  .  Colon cancer Sister 85  . Coronary artery disease Father   . Colon cancer Other     2 cousins, succumbed to illness    Social History:  reports that he has never smoked. He has never used smokeless tobacco. He reports that he does not drink alcohol or use illicit drugs.  Allergies: No Known Allergies  Medications: Prior to Admission medications   Medication Sig Start Date End Date Taking? Authorizing Provider  amiodarone (PACERONE) 200 MG tablet Take 1 tablet (200 mg total) by mouth daily. 05/23/14  Yes Maryann Mikhail, DO  apixaban (ELIQUIS) 5 MG TABS tablet Take 5 mg by mouth 2 (two) times daily.   Yes Historical Provider, MD  aspirin EC 81 MG tablet Take 81 mg by mouth at bedtime.   Yes Historical Provider, MD  citalopram (CELEXA) 20 MG tablet Take 1 tablet (20 mg total) by mouth daily. 07/19/14  Yes Arnoldo Lenis, MD  co-enzyme Q-10 50 MG capsule Take 50 mg by mouth every morning.     Yes Historical Provider, MD  digoxin (LANOXIN) 0.125 MG tablet Take 1 tablet (0.125 mg total) by mouth every other day. 09/29/14  Yes Erline Hau, MD  enalapril (VASOTEC) 2.5 MG tablet Take 1 tablet (2.5 mg total) by mouth daily. 07/19/14  Yes Eileen Stanford, PA-C  fluticasone (FLONASE) 50 MCG/ACT nasal spray Place 1 spray into both nostrils daily as needed for allergies.  11/21/13  Yes Historical Provider, MD  furosemide (LASIX) 20 MG tablet Take 1 tablet (20 mg total) by mouth daily as needed (swelling or shortness of breath). 12/19/14  Yes Arnoldo Lenis, MD  isosorbide dinitrate (ISORDIL) 30 MG tablet Take 15 mg by mouth 2 (two) times daily as needed (shortness of breath).  07/31/14  Yes Historical Provider, MD  meclizine (ANTIVERT) 25 MG tablet Take 25 mg by mouth as needed for dizziness.   Yes Historical Provider, MD  metoprolol succinate (TOPROL-XL) 100 MG 24 hr tablet Take 100 mg by mouth daily. Take with or immediately following a meal.   Yes Historical Provider, MD  niacin (NIASPAN) 1000 MG CR tablet Take 1,000 mg by mouth at bedtime.     Yes Historical Provider, MD  nitroGLYCERIN (NITROLINGUAL) 0.4 MG/SPRAY spray Place 1 spray under the tongue every 5 (five) minutes as needed. angina 07/19/14  Yes Arnoldo Lenis, MD  Omega-3 Fatty Acids (FISH OIL) 1200 MG CAPS Take 1,200 mg by mouth 2 (two) times daily.     Yes Historical Provider, MD  pantoprazole (PROTONIX) 40 MG tablet Take 1 tablet (40 mg total) by mouth daily. 07/19/14  Yes Arnoldo Lenis, MD  ranolazine (RANEXA) 1000 MG SR tablet Take 1 tablet (1,000 mg total) by mouth 2 (two) times daily. 05/13/14  Yes Arnoldo Lenis, MD  rosuvastatin (CRESTOR) 40 MG tablet Take 1 tablet (40 mg total) by mouth at bedtime. 09/27/14  Yes Arnoldo Lenis, MD  spironolactone (ALDACTONE) 25 MG tablet Take 12.5 mg by mouth daily.   Yes Historical Provider, MD  insulin aspart (NOVOLOG FLEXPEN) 100 UNIT/ML FlexPen Inject 6 Units into  the skin 3 (three) times daily with meals. Patient not taking: Reported on 12/24/2014 11/20/14   Radene Gunning, NP  Insulin Glargine (LANTUS) 100 UNIT/ML Solostar Pen Inject 20 Units into the skin daily at 10 pm. Patient not taking: Reported on 12/24/2014 11/20/14   Radene Gunning, NP  metoprolol succinate (TOPROL-XL) 25 MG 24 hr tablet Take 1 tablet (  25 mg total) by mouth 2 (two) times daily. Patient not taking: Reported on 12/24/2014 09/29/14   Erline Hau, MD  peg 3350 powder (MOVIPREP) 100 G SOLR Take 1 kit (200 g total) by mouth as directed. Patient not taking: Reported on 12/24/2014 11/12/14   Daneil Dolin, MD    Scheduled Meds: . amiodarone  200 mg Oral Daily  . apixaban  5 mg Oral BID  . citalopram  20 mg Oral Daily  . docusate sodium  100 mg Oral BID  . feeding supplement (GLUCERNA SHAKE)  237 mL Oral BID BM  . furosemide  40 mg Oral Daily  . insulin aspart  0-15 Units Subcutaneous TID WC  . insulin aspart  0-5 Units Subcutaneous QHS  . insulin aspart  3 Units Subcutaneous TID WC  . insulin glargine  12 Units Subcutaneous QHS  . metoprolol succinate  50 mg Oral Daily  . pantoprazole  40 mg Oral Daily  . ranolazine  1,000 mg Oral BID  . rosuvastatin  40 mg Oral QHS  . sodium bicarbonate  650 mg Oral BID  . sodium chloride  3 mL Intravenous Q12H   Continuous Infusions:  PRN Meds:.sodium chloride, acetaminophen, fluticasone, isosorbide dinitrate, ondansetron (ZOFRAN) IV, sodium chloride     Results for orders placed or performed during the hospital encounter of 12/24/14 (from the past 48 hour(s))  Glucose, capillary     Status: Abnormal   Collection Time: 12/30/14  5:07 PM  Result Value Ref Range   Glucose-Capillary 162 (H) 70 - 99 mg/dL   Comment 1 Notify RN    Comment 2 Documented in Chart   Glucose, capillary     Status: Abnormal   Collection Time: 12/30/14  9:16 PM  Result Value Ref Range   Glucose-Capillary 133 (H) 70 - 99 mg/dL   Comment 1 Notify RN     Comment 2 Documented in Chart   Basic metabolic panel     Status: Abnormal   Collection Time: 12/31/14  5:24 AM  Result Value Ref Range   Sodium 134 (L) 135 - 145 mmol/L   Potassium 4.6 3.5 - 5.1 mmol/L   Chloride 103 96 - 112 mmol/L   CO2 22 19 - 32 mmol/L   Glucose, Bld 121 (H) 70 - 99 mg/dL   BUN 54 (H) 6 - 23 mg/dL   Creatinine, Ser 4.34 (H) 0.50 - 1.35 mg/dL   Calcium 8.7 8.4 - 10.5 mg/dL   GFR calc non Af Amer 14 (L) >90 mL/min   GFR calc Af Amer 16 (L) >90 mL/min    Comment: (NOTE) The eGFR has been calculated using the CKD EPI equation. This calculation has not been validated in all clinical situations. eGFR's persistently <90 mL/min signify possible Chronic Kidney Disease.    Anion gap 9 5 - 15  CBC     Status: Abnormal   Collection Time: 12/31/14  5:24 AM  Result Value Ref Range   WBC 7.4 4.0 - 10.5 K/uL   RBC 3.34 (L) 4.22 - 5.81 MIL/uL   Hemoglobin 9.9 (L) 13.0 - 17.0 g/dL   HCT 30.2 (L) 39.0 - 52.0 %   MCV 90.4 78.0 - 100.0 fL   MCH 29.6 26.0 - 34.0 pg   MCHC 32.8 30.0 - 36.0 g/dL   RDW 14.6 11.5 - 15.5 %   Platelets 175 150 - 400 K/uL  Glucose, capillary     Status: Abnormal   Collection Time: 12/31/14  8:02  AM  Result Value Ref Range   Glucose-Capillary 111 (H) 70 - 99 mg/dL   Comment 1 Notify RN   Glucose, capillary     Status: Abnormal   Collection Time: 12/31/14 11:41 AM  Result Value Ref Range   Glucose-Capillary 136 (H) 70 - 99 mg/dL  Glucose, capillary     Status: Abnormal   Collection Time: 12/31/14  4:50 PM  Result Value Ref Range   Glucose-Capillary 107 (H) 70 - 99 mg/dL   Comment 1 Notify RN    Comment 2 Documented in Chart   Glucose, capillary     Status: Abnormal   Collection Time: 12/31/14 10:12 PM  Result Value Ref Range   Glucose-Capillary 169 (H) 70 - 99 mg/dL   Comment 1 Notify RN    Comment 2 Documented in Chart   Basic metabolic panel     Status: Abnormal   Collection Time: 01/01/15  5:06 AM  Result Value Ref Range   Sodium  133 (L) 135 - 145 mmol/L   Potassium 4.3 3.5 - 5.1 mmol/L   Chloride 100 96 - 112 mmol/L   CO2 24 19 - 32 mmol/L   Glucose, Bld 129 (H) 70 - 99 mg/dL   BUN 54 (H) 6 - 23 mg/dL   Creatinine, Ser 4.47 (H) 0.50 - 1.35 mg/dL   Calcium 9.0 8.4 - 10.5 mg/dL   GFR calc non Af Amer 13 (L) >90 mL/min   GFR calc Af Amer 15 (L) >90 mL/min    Comment: (NOTE) The eGFR has been calculated using the CKD EPI equation. This calculation has not been validated in all clinical situations. eGFR's persistently <90 mL/min signify possible Chronic Kidney Disease.    Anion gap 9 5 - 15  Digoxin level     Status: None   Collection Time: 01/01/15  5:06 AM  Result Value Ref Range   Digoxin Level 1.2 0.8 - 2.0 ng/mL  Glucose, capillary     Status: Abnormal   Collection Time: 01/01/15  7:32 AM  Result Value Ref Range   Glucose-Capillary 117 (H) 70 - 99 mg/dL   Comment 1 Notify RN   Glucose, capillary     Status: Abnormal   Collection Time: 01/01/15 11:49 AM  Result Value Ref Range   Glucose-Capillary 151 (H) 70 - 99 mg/dL   Comment 1 Notify RN     Studies/Results:  HEAD CT 1. Interval near-complete resolution of previously demonstrated blood in the basilar cisterns. 2. No new findings. No evidence of hydrocephalus.    Adel Neyer A. Merlene [name], M.D.  Diplomate, Tax adviser of Psychiatry and Neurology ( Neurology). 01/01/2015, 3:01 PM

## 2015-01-01 NOTE — Progress Notes (Signed)
[name]  MRN: [id number]  DOB/AGE: [birth year] 59 y.o.  Primary Care Physician:BRANCH, Alphonse Guild, MD  Admit date: 12/24/2014  Chief Complaint:  Chief Complaint  Patient presents with  . Fall    S-Pt presented on  12/24/2014 with  Chief Complaint  Patient presents with  . Fall  .    Pt today feels better. Pt says " My dizziness is better"  Meds . amiodarone  200 mg Oral Daily  . apixaban  5 mg Oral BID  . citalopram  20 mg Oral Daily  . docusate sodium  100 mg Oral BID  . feeding supplement (GLUCERNA SHAKE)  237 mL Oral BID BM  . furosemide  40 mg Oral Daily  . insulin aspart  0-15 Units Subcutaneous TID WC  . insulin aspart  0-5 Units Subcutaneous QHS  . insulin aspart  3 Units Subcutaneous TID WC  . insulin glargine  12 Units Subcutaneous QHS  . metoprolol succinate  50 mg Oral Daily  . pantoprazole  40 mg Oral Daily  . ranolazine  1,000 mg Oral BID  . rosuvastatin  40 mg Oral QHS  . sodium bicarbonate  650 mg Oral BID  . sodium chloride  3 mL Intravenous Q12H       Physical Exam: Vital signs in last 24 hours: Temp:  [98.1 F (36.7 C)-98.5 F (36.9 C)] 98.1 F (36.7 C) (01/27 0631) Pulse Rate:  [71] 71 (01/27 0631) Resp:  [20-22] 22 (01/27 0631) BP: (113)/(67) 113/67 mmHg (01/27 0631) SpO2:  [95 %-100 %] 100 % (01/27 0631) Weight:  [216 lb 11.4 oz (98.3 kg)] 216 lb 11.4 oz (98.3 kg) (01/27 0631) Weight change: -10.6 oz (-0.3 kg) Last BM Date: 12/31/14  Intake/Output from previous day: 01/26 0701 - 01/27 0700 In: 240 [P.O.:240] Out: 2100 [Urine:2100]     Physical Exam: General- pt is awake,alert, oriented to time place and person Resp- No acute REsp distress, CTA B/L NO Rhonchi CVS- S1S2 regular in rate and rhythm GIT- BS+, soft, NT, ND EXT- NO LE Edema, Cyanosis   Lab Results: CBC  Recent Labs  12/31/14 0524  WBC 7.4  HGB 9.9*  HCT 30.2*  PLT 175    BMET  Recent Labs  12/31/14 0524 01/01/15 0506  NA 134* 133*  K 4.6  4.3  CL 103 100  CO2 22 24  GLUCOSE 121* 129*  BUN 54* 54*  CREATININE 4.34* 4.47*  CALCIUM 8.7 9.0   Trend Creat 2016 5.69=>3.96=>4.34=>4.47 2015  1.5--2.5 2014   1.4--1.8  MICRO No results found for this or any previous visit (from the past 240 hour(s)).    Lab Results  Component Value Date   PTH 41 12/26/2014   PTH Comment 12/26/2014   CALCIUM 9.0 01/01/2015   CAION 1.05* 03/23/2010   PHOS 3.0 12/27/2014               Impression: 1)Renal  AKI secondary to ATN                AKI on CKD                              Creat now at plateau 4.3--4.4               CKD stage 3/4               CKD since 2014  CKD secondary to HTN / DM /Cardiorenal                Progression of CKD marked with AKI                Proteinura Absent .               2)HTN Target Organ damage  CKD CAD CVA CHF  Medication- On Diuretics On Beta blockers    3)Anemia HGb at goal (9--11)   4)CKD Mineral-Bone Disorder PTH acceptable. Secondary Hyperparathyroidism absent. Phosphorus at goal.   5)CNS-admitted with Sub Arachnoid Hemorrhage  Primary MD following  6)Electrolytes Normokalemic NOrmonatremic   7)Acid base Co2 at goal     Plan:  Will continue current care      [place] S 01/01/2015, 9:04 AM

## 2015-01-01 NOTE — Clinical Social Work Psychosocial (Signed)
Clinical Social Work Department BRIEF PSYCHOSOCIAL ASSESSMENT 01/01/2015  Patient:  [name], [name]     Account Number:  [id number]     Admit date:  12/24/2014  Clinical Social Worker:  Wyatt Haste  Date/Time:  01/01/2015 12:11 PM  Referred by:  CSW  Date Referred:  01/01/2015 Referred for  SNF Placement   Other Referral:   Interview type:  Patient Other interview type:   wife- [name]    PSYCHOSOCIAL DATA Living Status:  WIFE Admitted from facility:   Level of care:   Primary support name:  [name] Primary support relationship to patient:  CHILD, ADULT Degree of support available:   supportive    CURRENT CONCERNS Current Concerns  Post-Acute Placement   Other Concerns:    SOCIAL WORK ASSESSMENT / PLAN CSW met with pt and pt's wife at bedside. Pt alert and able to answer some questions appropriately, but had periods of confusion/hallucinations. Pt reports that he came to ED after a fall last week. Pt's wife brought him to ED for evaluation. She states they noticed confusion over the weekend and it has become worse. Pt's wife is "terrified" of his confusion. CSW provided support. They have been married for almost 40 years. Pt is generally very independent at baseline and still drives and picks up his grandchildren. He reports he has had some falls for the past year and a half. Pt retired from the [employer] in 2001 after a massive heart attack. He was diagnosed with colon cancer in 2008 and has a colostomy. PT evaluated pt yesterday and feel pt will need SNF prior to return home. CSW discussed this with pt and pt's wife and they are agreeable. Aware of Medicare coverage/criteria. Requesting [facility], [facility], or [facility].   Assessment/plan status:  Psychosocial Support/Ongoing Assessment of Needs Other assessment/ plan:   Information/referral to community resources:   SNF list    PATIENT'S/FAMILY'S RESPONSE TO PLAN OF CARE: Pt and pt's wife feel pt would benefit  from short term stay at SNF. CSW will initiate bed search.       Benay Pike, [place]

## 2015-01-01 NOTE — Progress Notes (Signed)
Pt complaining of chest pain with radiation to shoulder. STAT EKG ordered. MD notified and is aware.

## 2015-01-01 NOTE — Progress Notes (Signed)
PT Cancellation Note  Patient Details Name: [name] MRN: [id number] DOB: Apr 13, 1956   Cancelled Treatment:    Reason Eval/Treat Not Completed: Other (comment)  Reviewed chart and noted pt has had a change in mental status, with hallucinations and multiple myoclonic jerks sporadically while in bed.  Spoke with NP Renard Hamper and MD [facility] regarding patient, and additional testing being completed regarding condition. MD Memon requested hold on PT today secondary to current state.  Will re-attempt PT when able.     Lonna Cobb, DPT [phone]  01/01/2015, 4:18 PM

## 2015-01-01 NOTE — Progress Notes (Signed)
[facility] PROGRESS NOTE  [name] OHY:[id number] DOB: 1956-03-04 DOA: 12/24/2014 PCP: Arnoldo Lenis, MD  Assessment/Plan:  Acute encephalopathy Worse today. Appeared to have element of sundowners last 2 days but he continues with hallucinations today.  Etiology unclear. Kidney function has stablized, dig level within limits of normal, no indication of infection,  CT 12/28/14 showed improvement SAH, oxygen saturation level >90%, acidosis resolving. Will repeat CT and request EEG. Check ammonia and LFT.  Appreciate Dr Merlene Laughter.  Acute on chronic kidney failure stage III. Provided with fluids and function improved. Developed volume overload so IV stopped and lasix started. Creatinine continues to trend up slightly. Continues with good urine output. Voided 2100 last 24 hours. Edema resolved.  Continue po lasix.  Appreciate assistance of nephrology who opin ATN vs prerenal. plan to follow as outpatient.   SAH (subarachnoid hemorrhage): Secondary to mechanical fall in setting of aspirin and Elaquis. Repeat CT  after 24 hoursstable with no changes. Neurologic exam remains benign. No focal deficits. PT evaluated and recommended OP PT. Restarted eliquis. Pt complained of headache on 1/23 thus repeat CT was done revealing interval near-complete resolution of blood in basilar cisterns.  Metabolic acidosis  resolving. Related to #1. Bicarb po initiated by nephrology. improving   Abdominal pain: related to constipation. resolved    Diabetes mellitus, type II: Hemoglobin A1c 10.6. good control. Continue half of his Lantus dose and provide sliding scale for optimal control.    PAF (paroxysmal atrial fibrillation): Rate controlled. Resumed eliquis   Obesity: BMI 30.3 nutritional consult    GERD (gastroesophageal reflux disease): Stable at baseline   CAD (coronary artery disease): No chest pain. Initial troponin negative.  1. Chronic Systolic CHF: See #1. IVF  discontinued. 80mg  lasix daily. Urine output 217ml last 24 hours.  Code Status: full Family Communication: wife at bedside Disposition Plan: home when ready   Consultants:  Nephrology  neurology  Procedures:  none  Antibiotics:  none  HPI/Subjective: Lying in bed stuffing dry wash cloth in his mouth. Reports "bugs on my teeth". Denies pain/discomfort  Objective: Filed Vitals:   01/01/15 1412  BP: 118/75  Pulse: 70  Temp: 98 F (36.7 C)  Resp: 20    Intake/Output Summary (Last 24 hours) at 01/01/15 1512 Last data filed at 01/01/15 1245  Gross per 24 hour  Intake    723 ml  Output   2300 ml  Net  -1577 ml   Filed Weights   12/30/14 0630 12/31/14 0531 01/01/15 0631  Weight: 100.9 kg (222 lb 7.1 oz) 98.6 kg (217 lb 6 oz) 98.3 kg (216 lb 11.4 oz)    Exam:   General:  Mildly agitated, cooperative appears somewhat ill  Cardiovascular: rrr no MGR no LE edema  Respiratory: normal effort BS clear bilaterally no wheeze  Abdomen: obese soft +BS colostomy intact with gas  Neuro: oriented to self and place, speech clear but needs frequent redirection, moves all extremities. LE with some twitching. Sensation intact.   Musculoskeletal: no erythema or swelling in joints  Data Reviewed: Basic Metabolic Panel:  Recent Labs Lab 12/27/14 0534 12/28/14 0706 12/29/14 0629 12/30/14 0520 12/31/14 0524 01/01/15 0506  NA 137 134* 133* 133* 134* 133*  K 4.2 4.7 4.8 4.6 4.6 4.3  CL 108 107 107 106 103 100  CO2 22 18* 18* 18* 22 24  GLUCOSE 118* 159* 140* 111* 121* 129*  BUN 52* 49* 48* 48* 54* 54*  CREATININE 4.60* 4.24* 3.96* 4.10* 4.34* 4.47*  CALCIUM 8.8 8.5 8.3* 8.7 8.7 9.0  MG  --   --  1.8  --   --   --   PHOS 3.0  --   --   --   --   --    Liver Function Tests:  Recent Labs Lab 01/01/15 1356  AST 47*  ALT 23  ALKPHOS 69  BILITOT 0.7  PROT 7.2  ALBUMIN 2.8*   No results for input(s): LIPASE, AMYLASE in the last 168 hours. No results for  input(s): AMMONIA in the last 168 hours. CBC:  Recent Labs Lab 12/31/14 0524  WBC 7.4  HGB 9.9*  HCT 30.2*  MCV 90.4  PLT 175   Cardiac Enzymes: No results for input(s): CKTOTAL, CKMB, CKMBINDEX, TROPONINI in the last 168 hours. BNP (last 3 results)  Recent Labs  07/18/14 0555 09/27/14 0523 11/19/14 0745  PROBNP 1116.0* 9164.0* 5233.0*   CBG:  Recent Labs Lab 12/31/14 1141 12/31/14 1650 12/31/14 2212 01/01/15 0732 01/01/15 1149  GLUCAP 136* 107* 169* 117* 151*    No results found for this or any previous visit (from the past 240 hour(s)).   Studies: No results found.  Scheduled Meds: . amiodarone  200 mg Oral Daily  . apixaban  5 mg Oral BID  . citalopram  20 mg Oral Daily  . docusate sodium  100 mg Oral BID  . feeding supplement (GLUCERNA SHAKE)  237 mL Oral BID BM  . furosemide  40 mg Oral Daily  . insulin aspart  0-15 Units Subcutaneous TID WC  . insulin aspart  0-5 Units Subcutaneous QHS  . insulin aspart  3 Units Subcutaneous TID WC  . insulin glargine  12 Units Subcutaneous QHS  . metoprolol succinate  50 mg Oral Daily  . pantoprazole  40 mg Oral Daily  . ranolazine  1,000 mg Oral BID  . rosuvastatin  40 mg Oral QHS  . sodium bicarbonate  650 mg Oral BID  . sodium chloride  3 mL Intravenous Q12H   Continuous Infusions:   Principal Problem:   SAH (subarachnoid hemorrhage) Active Problems:   Diabetes mellitus, type II   Essential hypertension   PAF (paroxysmal atrial fibrillation)   Obesity   Acute-on-chronic renal failure   DM type 2, uncontrolled, with renal complications   GERD (gastroesophageal reflux disease)   CAD (coronary artery disease)   Acute renal failure   Dehydration   Chronic systolic congestive heart failure   Acute renal failure syndrome   Abdominal pain, generalized   Metabolic acidosis   Acute encephalopathy   Fall    Time spent: 35 minutes    [facility] Pager [phone]. If 7PM-7AM,  please contact night-coverage at [email or web address], password [place] 01/01/2015, 3:12 PM  LOS: 8 days

## 2015-01-02 ENCOUNTER — Inpatient Hospital Stay (HOSPITAL_COMMUNITY)
Admission: EM | Admit: 2015-01-02 | Discharge: 2015-01-02 | Disposition: A | Discharge status: Home / Self Care | Payer: Medicare Other | Source: Home / Self Care | Attending: Neurology | Admitting: Neurology

## 2015-01-02 LAB — BASIC METABOLIC PANEL
Anion gap: 10 (ref 5–15)
BUN: 58 mg/dL — AB (ref 6–23)
CALCIUM: 9 mg/dL (ref 8.4–10.5)
CHLORIDE: 103 mmol/L (ref 96–112)
CO2: 23 mmol/L (ref 19–32)
Creatinine, Ser: 4.98 mg/dL — ABNORMAL HIGH (ref 0.50–1.35)
GFR, EST AFRICAN AMERICAN: 13 mL/min — AB (ref >90)
GFR, EST NON AFRICAN AMERICAN: 11 mL/min — AB (ref >90)
Glucose, Bld: 89 mg/dL (ref 70–99)
Potassium: 4.3 mmol/L (ref 3.5–5.1)
Sodium: 136 mmol/L (ref 135–145)

## 2015-01-02 LAB — CBC
HEMATOCRIT: 29.3 % — AB (ref 39.0–52.0)
HEMOGLOBIN: 9.6 g/dL — AB (ref 13.0–17.0)
MCH: 29.8 pg (ref 26.0–34.0)
MCHC: 32.8 g/dL (ref 30.0–36.0)
MCV: 91 fL (ref 78.0–100.0)
Platelets: 218 10*3/uL (ref 150–400)
RBC: 3.22 MIL/uL — ABNORMAL LOW (ref 4.22–5.81)
RDW: 14.9 % (ref 11.5–15.5)
WBC: 7.1 10*3/uL (ref 4.0–10.5)

## 2015-01-02 LAB — HOMOCYSTEINE: HOMOCYSTEINE-NORM: 29.6 umol/L — AB (ref 0.0–15.0)

## 2015-01-02 LAB — RPR: RPR: NONREACTIVE

## 2015-01-02 LAB — GLUCOSE, CAPILLARY
GLUCOSE-CAPILLARY: 125 mg/dL — AB (ref 70–99)
Glucose-Capillary: 79 mg/dL (ref 70–99)
Glucose-Capillary: 84 mg/dL (ref 70–99)
Glucose-Capillary: 85 mg/dL (ref 70–99)

## 2015-01-02 LAB — VITAMIN B12: Vitamin B-12: 298 pg/mL (ref 211–911)

## 2015-01-02 MED ORDER — CYANOCOBALAMIN 1000 MCG/ML IJ SOLN
1000.0000 ug | Freq: Once | INTRAMUSCULAR | Status: AC
  Administered 2015-01-02: 1000 ug via INTRAMUSCULAR
  Filled 2015-01-02: qty 1

## 2015-01-02 MED ORDER — FOLIC ACID 1 MG PO TABS
1.0000 mg | ORAL_TABLET | Freq: Every day | ORAL | Status: DC
  Administered 2015-01-02 – 2015-01-06 (×5): 1 mg via ORAL
  Filled 2015-01-02 (×5): qty 1

## 2015-01-02 NOTE — Progress Notes (Signed)
Notified by telemetry that patient was having runs of [place]. Upon assessment pacemaker was not firing, Patient was sleeping and was asymptomatic. VS were within normal limits. MD was notified. Will continue to monitor patient at this time.

## 2015-01-02 NOTE — Progress Notes (Signed)
D-    Called to room due to patient's irate sister-in-law threatening to call 911 if a doctor did not come the the bedside to assess the patient.  Upon entering the room patient's sister-in-law was upset and speaking with patient's wife on the phone telling the wife to get to the hospital or "they are going to let him lay here and die".  She hung up with the patient's wife and demanded to have information about the patient.  I informed her I could not share information about the patient because she was not the next of kin.  I did however attempt to calm her by letting her know that all the appropriate test had been done or at least ordered by the MD and that his condition had not changed.    A-    Patient remains in a delirious state which is unchanged from my assessment and reported condition. Safety sitter remains at bedside for patient safety.   I called Dr. Gasper Lloyd to informed her of the above and  that patient's family is requesting to have the MD come to the room.  Dr. Arnoldo Morale informed me she was not available to come to the bedside.  Administrative coordinator notified and came to the bedside to assist with family.  Patient's wife arrived and I informed her of the above and she informed the other family member at the bedside that she had spoken with Dr. Roderic Palau at length about the patient and was given a chance to ask any questions she needed answered.  Wife appears satisfied with explanations.    R- Patient's family is  more at ease and emotional support was given to all involved.  Wife and sister-in-law have opted to remain at the bedside even after encouragement from staff to go home and rest.  Safety sitter remains at bedside and patient's condition is unchanged.  Nursing staff to continue to monitor.

## 2015-01-02 NOTE — Progress Notes (Signed)
Subjective: Interval History: Patient is very somnolent but arousable. Presently unable to get an additional information from the patient however patient's family states that patient had episode of hallucination, confusion and restlessness and agitation to the nighttime. He was given some Ativan and presently seems to be resting. Objective: Vital signs in last 24 hours: Temp:  [97.8 F (36.6 C)-98.5 F (36.9 C)] 97.9 F (36.6 C) (01/28 0743) Pulse Rate:  [70-96] 96 (01/28 0743) Resp:  [20] 20 (01/28 0743) BP: (78-136)/(56-94) 98/74 mmHg (01/28 0743) SpO2:  [91 %-100 %] 97 % (01/28 0743) Weight:  [91.6 kg (201 lb 15.1 oz)] 91.6 kg (201 lb 15.1 oz) (01/28 0743) Weight change:   Intake/Output from previous day: 01/27 0701 - 01/28 0700 In: 483 [P.O.:480; I.V.:3] Out: 1300 [Urine:1300] Intake/Output this shift:    General appearance: alert, cooperative and no distress Resp: clear to auscultation bilaterally Cardio: regular rate and rhythm, S1, S2 normal, no murmur, click, rub or gallop GI: soft, non-tender; bowel sounds normal; no masses,  no organomegaly Extremities: edema Trace 1+ edema  Lab Results:  Recent Labs  12/31/14 0524 01/02/15 0708  WBC 7.4 7.1  HGB 9.9* 9.6*  HCT 30.2* 29.3*  PLT 175 218   BMET:   Recent Labs  01/01/15 0506 01/02/15 0708  NA 133* 136  K 4.3 4.3  CL 100 103  CO2 24 23  GLUCOSE 129* 89  BUN 54* 58*  CREATININE 4.47* 4.98*  CALCIUM 9.0 9.0   No results for input(s): PTH in the last 72 hours. Iron Studies: No results for input(s): IRON, TIBC, TRANSFERRIN, FERRITIN in the last 72 hours.  Studies/Results: Ct Head Wo Contrast  01/01/2015   CLINICAL DATA:  Subsequent evaluation of subarachnoid hemorrhage, fell on January 19th, patient complaining of headache and altered mental status  EXAM: CT HEAD WITHOUT CONTRAST  TECHNIQUE: Contiguous axial images were obtained from the base of the skull through the vertex without intravenous contrast.   COMPARISON:  12/28/2014, 12/25/2014  FINDINGS: The study is limited by mild motion artifact. The extra-axial fluid previously seen in the quadrigeminal and ambient cisterns is not identified currently suggesting complete resolution. There is no parenchymal hemorrhage or extra-axial fluid on the current examination. There is no evidence of infarct or mass. There is no hydrocephalus. Stable mild atrophy. The calvarium is intact. Visualized portions of the paranasal sinuses unremarkable. Atherosclerotic vascular calcifications again identified.  IMPRESSION: No acute findings. Interval resolution of previous extra-axial hemorrhage.   Electronically Signed   By: Skipper Cliche M.D.   On: 01/01/2015 16:15    I have reviewed the patient's current medications.  Assessment/Plan: Problem #1 acute kidney injury: Possibly ATN versus prerenal. His renal function continue to decline . Patient remains nonoliguric. A part of it could be from fluid removal. Problem #2 diabetes: He is on insulin and his blood glucose seems reasonable Problem #3 history of hypotension. Patient also has some dizziness when he walks. The only medication patient is on his Toprol. And that is for his atrial fibrillation. Problem #4 of atrial fibrillation : Patient had a few runs of V. tach. 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 Problem #5 increased confusion: Possibly metabolic/medication. Patient also with recent mild tenderness bleeding. Problem #6 rectal cancer Problem#7 History of CHF : Patient had 1300 mL of urine output. His edema has improved. Problem #8 metabolic acidosis: Patient also didn't bicarbonate and his CO2 is 22 has improved. Problem #9 anemia: His hemoglobin is below our target goal. Plan:  We'll continue with present management. We'll follow patient at this moment he doesn't require dialysis. If his renal function continue to increase possibly we may consider initiating dialysis. We'll check his basic  metabolic panel in the morning.    LOS: 9 days   [name] S 01/02/2015,9:01 AM

## 2015-01-02 NOTE — Progress Notes (Signed)
PT Cancellation Note  Patient Details Name: [name] MRN: [id number] DOB: 12/03/1956   Cancelled Treatment:     Pt medical status precludes the ability to work with PT.  Per Dr. Roderic Palau, we will d/c PT at this time.  He will resume orders when appropriate.   [name] 01/02/2015, 2:09 PM

## 2015-01-02 NOTE — Clinical Social Work Note (Signed)
CSW attempted to reach pt's wife to discuss bed offers. Left voicemail requesting return call.  Benay Pike, [place]

## 2015-01-02 NOTE — Progress Notes (Signed)
[facility] PROGRESS NOTE  [name] NGE:[id number] DOB: 05/04/1956 DOA: 12/24/2014 PCP: Carlyle Dolly, F, MD  Assessment/Plan: Acute encephalopathy Combative with worsening hallucinations during night. Creatinine slowly creeping up over last 4 days. Urine output good. Dig level within limits of normal, no indication of infection, ammonia level and LFT unremarkable, TSH and RPR within limits of normal. Homocysteine and B12 pending.  CT 01/01/15 with no acute findings with resolution of previous SAH, oxygen saturation level >90%, acidosis resolving. Await EEG. Evaluated by neuro who opine possibility of medications not being cleared secondary to renal failure. Ranexa and celexa discontinued. Holding Eliquis in case LP required today.   Acute on chronic kidney failure stage III.Creatinine continues to trend up. Continues with good urine output. Voided 1300 last 24 hours. Trace edema hands. Continue po lasix. Appreciate assistance of nephrology who opine ATN vs prerenal.considering dialysis if creatinine continues to worsen.    SAH (subarachnoid hemorrhage): Secondary to mechanical fall in setting of aspirin and Elaquis. Repeat CT 01/01/15 with resolution.  Restarted eliquis. Pt complained of headache on 1/23 thus repeat CT was done revealing interval near-complete resolution of blood in basilar cisterns.  Metabolic acidosis Resolved. Continue bicarb supplement   Abdominal pain: related to constipation. resolved    Diabetes mellitus, type II: Hemoglobin A1c 10.6. good control. Continue half of his Lantus dose and provide sliding scale as well as meal coverage for optimal control.    PAF (paroxysmal atrial fibrillation): Rate controlled. Eliquis discontinued for now. See #1.   Obesity: BMI 30.3 nutritional consult    GERD (gastroesophageal reflux disease): Stable at baseline   CAD (coronary artery disease): complain CP last night during episode of delerium. EKG  with ventricular paced rhythm no acute changes. Troponin negative. Denies chest pain. 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 Asymptomatic.    Chronic Systolic CHF: See #1. IVF discontinued. 80mg  lasix daily. Urine output 1371ml last 24 hours.  Code Status: full Family Communication: wife at bedside Disposition Plan: to be determined   Consultants:  Nephrology  neurology  Procedures:  none  Antibiotics:  none  HPI/Subjective: Lying in bed eyes closed. Easily aroused. Somewhat agitated but cooperative  Objective: Filed Vitals:   01/02/15 0743  BP: 98/74  Pulse: 96  Temp: 97.9 F (36.6 C)  Resp: 20    Intake/Output Summary (Last 24 hours) at 01/02/15 0958 Last data filed at 01/02/15 4010  Gross per 24 hour  Intake    360 ml  Output   1000 ml  Net   -640 ml   Filed Weights   12/31/14 0531 01/01/15 0631 01/02/15 0743  Weight: 98.6 kg (217 lb 6 oz) 98.3 kg (216 lb 11.4 oz) 91.6 kg (201 lb 15.1 oz)    Exam:   General:  Well nourished appears calm, face somewhat flushed  Cardiovascular: RRR no m/g/r trace edema hands/feet  Respiratory: normal effort BS clear bilaterally no wheeze  Abdomen: obese non-distended +BS colostomy bag intact with pink stoma  Musculoskeletal: no clubbing or cyanosis  Neuro: easily aroused, agitated will follow commands, moves all extremities. Speech clear. Oriented to self only   Data Reviewed: Basic Metabolic Panel:  Recent Labs Lab 12/27/14 0534  12/29/14 0629 12/30/14 0520 12/31/14 0524 01/01/15 0506 01/02/15 0708  NA 137  < > 133* 133* 134* 133* 136  K 4.2  < > 4.8 4.6 4.6 4.3 4.3  CL 108  < > 107 106 103 100 103  CO2 22  < > 18* 18* 22  24 23  GLUCOSE 118*  < > 140* 111* 121* 129* 89  BUN 52*  < > 48* 48* 54* 54* 58*  CREATININE 4.60*  < > 3.96* 4.10* 4.34* 4.47* 4.98*  CALCIUM 8.8  < > 8.3* 8.7 8.7 9.0 9.0  MG  --   --  1.8  --   --   --   --   PHOS 3.0  --   --   --   --   --   --   < > = values in this  interval not displayed. Liver Function Tests:  Recent Labs Lab 01/01/15 1356  AST 47*  ALT 23  ALKPHOS 69  BILITOT 0.7  PROT 7.2  ALBUMIN 2.8*   No results for input(s): LIPASE, AMYLASE in the last 168 hours.  Recent Labs Lab 01/01/15 1356  AMMONIA 34*   CBC:  Recent Labs Lab 12/31/14 0524 01/02/15 0708  WBC 7.4 7.1  HGB 9.9* 9.6*  HCT 30.2* 29.3*  MCV 90.4 91.0  PLT 175 218   Cardiac Enzymes:  Recent Labs Lab 01/01/15 1514  CKTOTAL 77  TROPONINI 0.03   BNP (last 3 results)  Recent Labs  07/18/14 0555 09/27/14 0523 11/19/14 0745  PROBNP 1116.0* 9164.0* 5233.0*   CBG:  Recent Labs Lab 01/01/15 0732 01/01/15 1149 01/01/15 1613 01/01/15 2023 01/02/15 0739  GLUCAP 117* 151* 95 104* 79    No results found for this or any previous visit (from the past 240 hour(s)).   Studies: Ct Head Wo Contrast  01/01/2015   CLINICAL DATA:  Subsequent evaluation of subarachnoid hemorrhage, fell on January 19th, patient complaining of headache and altered mental status  EXAM: CT HEAD WITHOUT CONTRAST  TECHNIQUE: Contiguous axial images were obtained from the base of the skull through the vertex without intravenous contrast.  COMPARISON:  12/28/2014, 12/25/2014  FINDINGS: The study is limited by mild motion artifact. The extra-axial fluid previously seen in the quadrigeminal and ambient cisterns is not identified currently suggesting complete resolution. There is no parenchymal hemorrhage or extra-axial fluid on the current examination. There is no evidence of infarct or mass. There is no hydrocephalus. Stable mild atrophy. The calvarium is intact. Visualized portions of the paranasal sinuses unremarkable. Atherosclerotic vascular calcifications again identified.  IMPRESSION: No acute findings. Interval resolution of previous extra-axial hemorrhage.   Electronically Signed   By: Skipper Cliche M.D.   On: 01/01/2015 16:15    Scheduled Meds: . amiodarone  200 mg Oral Daily   . docusate sodium  100 mg Oral BID  . feeding supplement (GLUCERNA SHAKE)  237 mL Oral BID BM  . furosemide  40 mg Oral Daily  . insulin aspart  0-15 Units Subcutaneous TID WC  . insulin aspart  0-5 Units Subcutaneous QHS  . insulin aspart  3 Units Subcutaneous TID WC  . insulin glargine  12 Units Subcutaneous QHS  . metoprolol succinate  50 mg Oral Daily  . pantoprazole  40 mg Oral Daily  . rosuvastatin  40 mg Oral QHS  . sodium bicarbonate  650 mg Oral BID  . sodium chloride  3 mL Intravenous Q12H   Continuous Infusions:   Principal Problem:   SAH (subarachnoid hemorrhage) Active Problems:   Diabetes mellitus, type II   Essential hypertension   PAF (paroxysmal atrial fibrillation)   Obesity   Acute-on-chronic renal failure   DM type 2, uncontrolled, with renal complications   GERD (gastroesophageal reflux disease)   CAD (coronary artery disease)  Acute renal failure   Dehydration   Chronic systolic congestive heart failure   Acute renal failure syndrome   Abdominal pain, generalized   Metabolic acidosis   Acute encephalopathy   Fall    Time spent: 40 minutes    [name] Pager [phone]. If 7PM-7AM, please contact night-coverage at [email or web address], password [facility] 01/02/2015, 9:58 AM  LOS: 9 days

## 2015-01-02 NOTE — Progress Notes (Signed)
Patient ID: [name], male   DOB: 12/30/1955, 59 y.o.   MRN: [id number]   [facility]  A. , MD     [email or web address]          [name] is an 59 y.o. male.   Assessment/Plan: Acute encephalopathy with agitation. Undoubtedly, this is a toxic metabolic phenomenon. I acute renal failure is most likely culprit. The uremia however is causing the patient's medications not to be cleared and these medications are building up in the patient's system. Two medications are particularly concerning. They are RANEXA AND CELEXA. RANEXA has been reported to cause renal failure, confusion and tremors all of which the patient has. Celexa is known to cause tremor and confusion especially in the setting of acute renal failure. I believe we should discontinue both of these medications. Continue to avoid psychotropic medications. Labs reveal elevated homocysteine level and relatively low B12. We'll give the patient a dose of B12 and also start folic acid.  EEG is quite abnormal showing severe slowing- generalized. The generalized slowing again points to a metabolic toxic process. Additionally, creatinine appears to be creeping up again. The case is being followed by nephrology.  Still confused and restless. Still with jerky movements of legs at time.   GENERAL: He is resting in bed but somewhat agitated and restless.   HEENT: Supple. Atraumatic normocephalic.   ABDOMEN: soft  EXTREMITIES: No edema   BACK: Normal.  SKIN: Normal by inspection.   MENTAL STATUS: On entering the room, the patient is talking to people who are not there. Speech is nonsensical. He does seem to be lucid however doing most of the evaluation. He knows the season the hospital. He does follow commands well. Speech is normal.  CRANIAL NERVES: Pupils are equal, round and reactive to light and accommodation; extra ocular movements are full, there is no significant nystagmus; visual fields are full;  upper and lower facial muscles are normal in strength and symmetric, there is no flattening of the nasolabial folds; tongue is midline; uvula is midline; shoulder elevation is normal.  MOTOR: Normal tone, bulk and strength; no pronator drift.  COORDINATION: Left finger to nose is normal, right finger to nose is normal, No rest tremor; no intention tremor; no postural tremor; no bradykinesia. However, the patient has frequent body jerks and twitches all over. These are not fasciculations but quit jerky movements and fidgets.  REFLEXES: Deep tendon reflexes are symmetrical and normal. Babinski reflexes are flexor bilaterally.   SENSATION: Normal to light touch.     Objective: Vital signs in last 24 hours: Temp:  [97.7 F (36.5 C)-99 F (37.2 C)] 97.7 F (36.5 C) (01/28 1457) Pulse Rate:  [72-96] 90 (01/28 1457) Resp:  [18-20] 20 (01/28 1457) BP: (98-136)/(68-94) 113/68 mmHg (01/28 1457) SpO2:  [92 %-97 %] 97 % (01/28 1457) Weight:  [91.6 kg (201 lb 15.1 oz)] 91.6 kg (201 lb 15.1 oz) (01/28 0743)  Intake/Output from previous day: 01/27 0701 - 01/28 0700 In: 483 [P.O.:480; I.V.:3] Out: 1300 [Urine:1300] Intake/Output this shift: Total I/O In: 123 [P.O.:120; I.V.:3] Out: 300 [Urine:300] Nutritional status: Diet Carb Modified   Lab Results: Results for orders placed or performed during the hospital encounter of 12/24/14 (from the past 48 hour(s))  Glucose, capillary     Status: Abnormal   Collection Time: 12/31/14 10:12 PM  Result Value Ref Range   Glucose-Capillary 169 (H) 70 - 99 mg/dL   Comment 1 Notify RN    Comment 2 Documented   Patient ID: [name], male   DOB: 12/30/1955, 59 y.o.   MRN: [id number]   [facility]  A. , MD     [email or web address]          [name] is an 59 y.o. male.   Assessment/Plan: Acute encephalopathy with agitation. Undoubtedly, this is a toxic metabolic phenomenon. I acute renal failure is most likely culprit. The uremia however is causing the patient's medications not to be cleared and these medications are building up in the patient's system. Two medications are particularly concerning. They are RANEXA AND CELEXA. RANEXA has been reported to cause renal failure, confusion and tremors all of which the patient has. Celexa is known to cause tremor and confusion especially in the setting of acute renal failure. I believe we should discontinue both of these medications. Continue to avoid psychotropic medications. Labs reveal elevated homocysteine level and relatively low B12. We'll give the patient a dose of B12 and also start folic acid.  EEG is quite abnormal showing severe slowing- generalized. The generalized slowing again points to a metabolic toxic process. Additionally, creatinine appears to be creeping up again. The case is being followed by nephrology.  Still confused and restless. Still with jerky movements of legs at time.   GENERAL: He is resting in bed but somewhat agitated and restless.   HEENT: Supple. Atraumatic normocephalic.   ABDOMEN: soft  EXTREMITIES: No edema   BACK: Normal.  SKIN: Normal by inspection.   MENTAL STATUS: On entering the room, the patient is talking to people who are not there. Speech is nonsensical. He does seem to be lucid however doing most of the evaluation. He knows the season the hospital. He does follow commands well. Speech is normal.  CRANIAL NERVES: Pupils are equal, round and reactive to light and accommodation; extra ocular movements are full, there is no significant nystagmus; visual fields are full;  upper and lower facial muscles are normal in strength and symmetric, there is no flattening of the nasolabial folds; tongue is midline; uvula is midline; shoulder elevation is normal.  MOTOR: Normal tone, bulk and strength; no pronator drift.  COORDINATION: Left finger to nose is normal, right finger to nose is normal, No rest tremor; no intention tremor; no postural tremor; no bradykinesia. However, the patient has frequent body jerks and twitches all over. These are not fasciculations but quit jerky movements and fidgets.  REFLEXES: Deep tendon reflexes are symmetrical and normal. Babinski reflexes are flexor bilaterally.   SENSATION: Normal to light touch.     Objective: Vital signs in last 24 hours: Temp:  [97.7 F (36.5 C)-99 F (37.2 C)] 97.7 F (36.5 C) (01/28 1457) Pulse Rate:  [72-96] 90 (01/28 1457) Resp:  [18-20] 20 (01/28 1457) BP: (98-136)/(68-94) 113/68 mmHg (01/28 1457) SpO2:  [92 %-97 %] 97 % (01/28 1457) Weight:  [91.6 kg (201 lb 15.1 oz)] 91.6 kg (201 lb 15.1 oz) (01/28 0743)  Intake/Output from previous day: 01/27 0701 - 01/28 0700 In: 483 [P.O.:480; I.V.:3] Out: 1300 [Urine:1300] Intake/Output this shift: Total I/O In: 123 [P.O.:120; I.V.:3] Out: 300 [Urine:300] Nutritional status: Diet Carb Modified   Lab Results: Results for orders placed or performed during the hospital encounter of 12/24/14 (from the past 48 hour(s))  Glucose, capillary     Status: Abnormal   Collection Time: 12/31/14 10:12 PM  Result Value Ref Range   Glucose-Capillary 169 (H) 70 - 99 mg/dL   Comment 1 Notify RN    Comment 2 Documented   Patient ID: [name], male   DOB: March 18, 1956, 59 y.o.   MRN: [id number]   Roscoe A. Merlene Laughter, MD     [email or web address]          [name] is an 59 y.o. male.   Assessment/Plan: Acute encephalopathy with agitation. Undoubtedly, this is a toxic metabolic phenomenon. I acute renal failure is most likely culprit. The uremia however is causing the patient's medications not to be cleared and these medications are building up in the patient's system. Two medications are particularly concerning. They are [place]. RANEXA has been reported to cause renal failure, confusion and tremors all of which the patient has. Celexa is known to cause tremor and confusion especially in the setting of acute renal failure. I believe we should discontinue both of these medications. Continue to avoid psychotropic medications. Labs reveal elevated homocysteine level and relatively low B12. We'll give the patient a dose of B12 and also start folic acid.  EEG is quite abnormal showing severe slowing- generalized. The generalized slowing again points to a metabolic toxic process. Additionally, creatinine appears to be creeping up again. The case is being followed by nephrology.  Still confused and restless. Still with jerky movements of legs at time.   GENERAL: He is resting in bed but somewhat agitated and restless.   HEENT: Supple. Atraumatic normocephalic.   ABDOMEN: soft  EXTREMITIES: No edema   BACK: Normal.  SKIN: Normal by inspection.   MENTAL STATUS: On entering the room, the patient is talking to people who are not there. Speech is nonsensical. He does seem to be lucid however doing most of the evaluation. He knows the season the hospital. He does follow commands well. Speech is normal.  CRANIAL NERVES: Pupils are equal, round and reactive to light and accommodation; extra ocular movements are full, there is no significant nystagmus; visual fields are full;  upper and lower facial muscles are normal in strength and symmetric, there is no flattening of the nasolabial folds; tongue is midline; uvula is midline; shoulder elevation is normal.  MOTOR: Normal tone, bulk and strength; no pronator drift.  COORDINATION: Left finger to nose is normal, right finger to nose is normal, No rest tremor; no intention tremor; no postural tremor; no bradykinesia. However, the patient has frequent body jerks and twitches all over. These are not fasciculations but quit jerky movements and fidgets.  REFLEXES: Deep tendon reflexes are symmetrical and normal. Babinski reflexes are flexor bilaterally.   SENSATION: Normal to light touch.     Objective: Vital signs in last 24 hours: Temp:  [97.7 F (36.5 C)-99 F (37.2 C)] 97.7 F (36.5 C) (01/28 1457) Pulse Rate:  [72-96] 90 (01/28 1457) Resp:  [18-20] 20 (01/28 1457) BP: (98-136)/(68-94) 113/68 mmHg (01/28 1457) SpO2:  [92 %-97 %] 97 % (01/28 1457) Weight:  [91.6 kg (201 lb 15.1 oz)] 91.6 kg (201 lb 15.1 oz) (01/28 0743)  Intake/Output from previous day: 01/27 0701 - 01/28 0700 In: 483 [P.O.:480; I.V.:3] Out: 1300 [Urine:1300] Intake/Output this shift: Total I/O In: 123 [P.O.:120; I.V.:3] Out: 300 [Urine:300] Nutritional status: Diet Carb Modified   Lab Results: Results for orders placed or performed during the hospital encounter of 12/24/14 (from the past 48 hour(s))  Glucose, capillary     Status: Abnormal   Collection Time: 12/31/14 10:12 PM  Result Value Ref Range   Glucose-Capillary 169 (H) 70 - 99 mg/dL   Comment 1 Notify RN    Comment 2 Documented  Patient ID: [name], male   DOB: 12/30/1955, 59 y.o.   MRN: [id number]   [facility]  A. , MD     [email or web address]          [name] is an 59 y.o. male.   Assessment/Plan: Acute encephalopathy with agitation. Undoubtedly, this is a toxic metabolic phenomenon. I acute renal failure is most likely culprit. The uremia however is causing the patient's medications not to be cleared and these medications are building up in the patient's system. Two medications are particularly concerning. They are RANEXA AND CELEXA. RANEXA has been reported to cause renal failure, confusion and tremors all of which the patient has. Celexa is known to cause tremor and confusion especially in the setting of acute renal failure. I believe we should discontinue both of these medications. Continue to avoid psychotropic medications. Labs reveal elevated homocysteine level and relatively low B12. We'll give the patient a dose of B12 and also start folic acid.  EEG is quite abnormal showing severe slowing- generalized. The generalized slowing again points to a metabolic toxic process. Additionally, creatinine appears to be creeping up again. The case is being followed by nephrology.  Still confused and restless. Still with jerky movements of legs at time.   GENERAL: He is resting in bed but somewhat agitated and restless.   HEENT: Supple. Atraumatic normocephalic.   ABDOMEN: soft  EXTREMITIES: No edema   BACK: Normal.  SKIN: Normal by inspection.   MENTAL STATUS: On entering the room, the patient is talking to people who are not there. Speech is nonsensical. He does seem to be lucid however doing most of the evaluation. He knows the season the hospital. He does follow commands well. Speech is normal.  CRANIAL NERVES: Pupils are equal, round and reactive to light and accommodation; extra ocular movements are full, there is no significant nystagmus; visual fields are full;  upper and lower facial muscles are normal in strength and symmetric, there is no flattening of the nasolabial folds; tongue is midline; uvula is midline; shoulder elevation is normal.  MOTOR: Normal tone, bulk and strength; no pronator drift.  COORDINATION: Left finger to nose is normal, right finger to nose is normal, No rest tremor; no intention tremor; no postural tremor; no bradykinesia. However, the patient has frequent body jerks and twitches all over. These are not fasciculations but quit jerky movements and fidgets.  REFLEXES: Deep tendon reflexes are symmetrical and normal. Babinski reflexes are flexor bilaterally.   SENSATION: Normal to light touch.     Objective: Vital signs in last 24 hours: Temp:  [97.7 F (36.5 C)-99 F (37.2 C)] 97.7 F (36.5 C) (01/28 1457) Pulse Rate:  [72-96] 90 (01/28 1457) Resp:  [18-20] 20 (01/28 1457) BP: (98-136)/(68-94) 113/68 mmHg (01/28 1457) SpO2:  [92 %-97 %] 97 % (01/28 1457) Weight:  [91.6 kg (201 lb 15.1 oz)] 91.6 kg (201 lb 15.1 oz) (01/28 0743)  Intake/Output from previous day: 01/27 0701 - 01/28 0700 In: 483 [P.O.:480; I.V.:3] Out: 1300 [Urine:1300] Intake/Output this shift: Total I/O In: 123 [P.O.:120; I.V.:3] Out: 300 [Urine:300] Nutritional status: Diet Carb Modified   Lab Results: Results for orders placed or performed during the hospital encounter of 12/24/14 (from the past 48 hour(s))  Glucose, capillary     Status: Abnormal   Collection Time: 12/31/14 10:12 PM  Result Value Ref Range   Glucose-Capillary 169 (H) 70 - 99 mg/dL   Comment 1 Notify RN    Comment 2 Documented

## 2015-01-02 NOTE — Procedures (Signed)
Robert A. Merlene Laughter, MD     [email or web address]           HISTORY: This is a 59 year old man who presents with agitated confusion and hallucination. The test done to evaluate for possible seizures presenting as confusion and hallucinations.  MEDICATIONS: Scheduled Meds: . amiodarone  200 mg Oral Daily  . docusate sodium  100 mg Oral BID  . feeding supplement (GLUCERNA SHAKE)  237 mL Oral BID BM  . furosemide  40 mg Oral Daily  . insulin aspart  0-15 Units Subcutaneous TID WC  . insulin aspart  0-5 Units Subcutaneous QHS  . insulin aspart  3 Units Subcutaneous TID WC  . insulin glargine  12 Units Subcutaneous QHS  . metoprolol succinate  50 mg Oral Daily  . pantoprazole  40 mg Oral Daily  . rosuvastatin  40 mg Oral QHS  . sodium bicarbonate  650 mg Oral BID  . sodium chloride  3 mL Intravenous Q12H   Continuous Infusions:  PRN Meds:.sodium chloride, acetaminophen, fluticasone, isosorbide dinitrate, ondansetron (ZOFRAN) IV, sodium chloride  Prior to Admission medications   Medication Sig Start Date End Date Taking? Authorizing Provider  amiodarone (PACERONE) 200 MG tablet Take 1 tablet (200 mg total) by mouth daily. 05/23/14  Yes Maryann Mikhail, DO  apixaban (ELIQUIS) 5 MG TABS tablet Take 5 mg by mouth 2 (two) times daily.   Yes Historical Provider, MD  aspirin EC 81 MG tablet Take 81 mg by mouth at bedtime.   Yes Historical Provider, MD  citalopram (CELEXA) 20 MG tablet Take 1 tablet (20 mg total) by mouth daily. 07/19/14  Yes Arnoldo Lenis, MD  co-enzyme Q-10 50 MG capsule Take 50 mg by mouth every morning.    Yes Historical Provider, MD  digoxin (LANOXIN) 0.125 MG tablet Take 1 tablet (0.125 mg total) by mouth every other day. 09/29/14  Yes Erline Hau, MD  enalapril (VASOTEC) 2.5 MG tablet Take 1 tablet (2.5 mg total) by mouth daily. 07/19/14  Yes Eileen Stanford, PA-C  fluticasone (FLONASE) 50 MCG/ACT nasal spray Place 1 spray into  both nostrils daily as needed for allergies.  11/21/13  Yes Historical Provider, MD  furosemide (LASIX) 20 MG tablet Take 1 tablet (20 mg total) by mouth daily as needed (swelling or shortness of breath). 12/19/14  Yes Arnoldo Lenis, MD  isosorbide dinitrate (ISORDIL) 30 MG tablet Take 15 mg by mouth 2 (two) times daily as needed (shortness of breath).  07/31/14  Yes Historical Provider, MD  meclizine (ANTIVERT) 25 MG tablet Take 25 mg by mouth as needed for dizziness.   Yes Historical Provider, MD  metoprolol succinate (TOPROL-XL) 100 MG 24 hr tablet Take 100 mg by mouth daily. Take with or immediately following a meal.   Yes Historical Provider, MD  niacin (NIASPAN) 1000 MG CR tablet Take 1,000 mg by mouth at bedtime.     Yes Historical Provider, MD  nitroGLYCERIN (NITROLINGUAL) 0.4 MG/SPRAY spray Place 1 spray under the tongue every 5 (five) minutes as needed. angina 07/19/14  Yes Arnoldo Lenis, MD  Omega-3 Fatty Acids (FISH OIL) 1200 MG CAPS Take 1,200 mg by mouth 2 (two) times daily.     Yes Historical Provider, MD  pantoprazole (PROTONIX) 40 MG tablet Take 1 tablet (40 mg total) by mouth daily. 07/19/14  Yes Arnoldo Lenis, MD  ranolazine (RANEXA) 1000 MG SR tablet Take 1 tablet (1,000 mg total) by mouth 2 (two) times daily. 05/13/14  Yes Arnoldo Lenis, MD  rosuvastatin (CRESTOR) 40 MG tablet Take 1 tablet (40 mg total) by mouth at bedtime. 09/27/14  Yes Arnoldo Lenis, MD  spironolactone (ALDACTONE) 25 MG tablet Take 12.5 mg by mouth daily.   Yes Historical Provider, MD  insulin aspart (NOVOLOG FLEXPEN) 100 UNIT/ML FlexPen Inject 6 Units into the skin 3 (three) times daily with meals. Patient not taking: Reported on 12/24/2014 11/20/14   Radene Gunning, NP  Insulin Glargine (LANTUS) 100 UNIT/ML Solostar Pen Inject 20 Units into the skin daily at 10 pm. Patient not taking: Reported on 12/24/2014 11/20/14   Radene Gunning, NP  metoprolol succinate (TOPROL-XL) 25 MG 24 hr tablet Take 1  tablet (25 mg total) by mouth 2 (two) times daily. Patient not taking: Reported on 12/24/2014 09/29/14   Erline Hau, MD  peg 3350 powder (MOVIPREP) 100 G SOLR Take 1 kit (200 g total) by mouth as directed. Patient not taking: Reported on 12/24/2014 11/12/14   Daneil Dolin, MD      ANALYSIS: A 16 channel recording using standard 10 20 measurements is conducted for 21 minutes. The background activity shows generalized slowing throughout most of the recording. The activity gets as high as 4 Hz. There is some spindles observed a frequently indicating stage II sleep. There is also some triphasic waves seen throughout the recording. No focal or lateral slowing is observed. No epileptiform activity is observed. Photic stimulation and hyperventilation are not carried out.   IMPRESSION: 1. This recording is markedly abnormal showing severe generalized slowing indicating a severe generalized encephalopathy. There is also a moderate amount of triphasic waves. Triphasic waves are seen in hepatic and renal encephalopathies. No epileptiform activities are observed.      Gentry Pilson A. Merlene [name], M.D.  Diplomate, Tax adviser of Psychiatry and Neurology ( Neurology).

## 2015-01-02 NOTE — Progress Notes (Signed)
EEG Completed; Results Pending  

## 2015-01-03 DIAGNOSIS — G9340 Encephalopathy, unspecified: Secondary | ICD-10-CM | POA: Insufficient documentation

## 2015-01-03 LAB — COMPREHENSIVE METABOLIC PANEL
ALBUMIN: 2.7 g/dL — AB (ref 3.5–5.2)
ALT: 25 U/L (ref 0–53)
AST: 47 U/L — ABNORMAL HIGH (ref 0–37)
Alkaline Phosphatase: 63 U/L (ref 39–117)
Anion gap: 9 (ref 5–15)
BUN: 57 mg/dL — ABNORMAL HIGH (ref 6–23)
CHLORIDE: 100 mmol/L (ref 96–112)
CO2: 27 mmol/L (ref 19–32)
Calcium: 8.7 mg/dL (ref 8.4–10.5)
Creatinine, Ser: 4.78 mg/dL — ABNORMAL HIGH (ref 0.50–1.35)
GFR calc Af Amer: 14 mL/min — ABNORMAL LOW (ref >90)
GFR, EST NON AFRICAN AMERICAN: 12 mL/min — AB (ref >90)
GLUCOSE: 107 mg/dL — AB (ref 70–99)
Potassium: 4 mmol/L (ref 3.5–5.1)
SODIUM: 136 mmol/L (ref 135–145)
Total Bilirubin: 1 mg/dL (ref 0.3–1.2)
Total Protein: 7.1 g/dL (ref 6.0–8.3)

## 2015-01-03 LAB — CBC
HCT: 30.7 % — ABNORMAL LOW (ref 39.0–52.0)
Hemoglobin: 10 g/dL — ABNORMAL LOW (ref 13.0–17.0)
MCH: 29.7 pg (ref 26.0–34.0)
MCHC: 32.6 g/dL (ref 30.0–36.0)
MCV: 91.1 fL (ref 78.0–100.0)
Platelets: 217 10*3/uL (ref 150–400)
RBC: 3.37 MIL/uL — AB (ref 4.22–5.81)
RDW: 15.6 % — ABNORMAL HIGH (ref 11.5–15.5)
WBC: 7.1 10*3/uL (ref 4.0–10.5)

## 2015-01-03 LAB — POTASSIUM: Potassium: 3.7 mmol/L (ref 3.5–5.1)

## 2015-01-03 LAB — GLUCOSE, CAPILLARY
GLUCOSE-CAPILLARY: 97 mg/dL (ref 70–99)
GLUCOSE-CAPILLARY: 163 mg/dL — AB (ref 70–99)
Glucose-Capillary: 209 mg/dL — ABNORMAL HIGH (ref 70–99)

## 2015-01-03 LAB — MRSA PCR SCREENING: MRSA by PCR: NEGATIVE

## 2015-01-03 LAB — MAGNESIUM: MAGNESIUM: 2.2 mg/dL (ref 1.5–2.5)

## 2015-01-03 MED ORDER — AMIODARONE IV BOLUS ONLY 150 MG/100ML
150.0000 mg | Freq: Once | INTRAVENOUS | Status: AC
  Administered 2015-01-03: 150 mg via INTRAVENOUS
  Filled 2015-01-03: qty 100

## 2015-01-03 MED ORDER — AMIODARONE IV BOLUS ONLY 150 MG/100ML
INTRAVENOUS | Status: AC
  Filled 2015-01-03: qty 100

## 2015-01-03 MED ORDER — APIXABAN 5 MG PO TABS
5.0000 mg | ORAL_TABLET | Freq: Two times a day (BID) | ORAL | Status: DC
  Administered 2015-01-03 – 2015-01-06 (×6): 5 mg via ORAL
  Filled 2015-01-03 (×6): qty 1

## 2015-01-03 NOTE — Progress Notes (Signed)
[facility] PROGRESS NOTE  [name] HYI:[id number] DOB: 1956/04/04 DOA: 12/24/2014 PCP: Carlyle Dolly, F, MD  Assessment/Plan: Acute encephalopathy  Much improved this am. Urine output 2L last 24 hours.  Creatinine trending down today. Homocysteine elevated and B12 low.  EEG with no seizure activity but general encephalopathy. Appreciate neuro input. No indication LP. Will resume Eliquis. Provide folic acid and N86. Continue to monitor.   Acute on chronic kidney failure stage III.Creatinine trending downward slightly. Continues with good urine output. Voided 2000L last 24 hours. Trace edema hands. Continue po lasix. Appreciate assistance of nephrology who opine ATN vs prerenal.   SAH (subarachnoid hemorrhage): Secondary to mechanical fall in setting of aspirin and Elaquis. CT was done revealing interval near-complete resolution of blood in basilar cisterns. Resume eliquis  Metabolic acidosis Resolved. Continue bicarb supplement   Abdominal pain: related to constipation. resolved    Diabetes mellitus, type II: Hemoglobin A1c 10.6. good control. Continue half of his Lantus dose and provide sliding scale as well as meal coverage for optimal control.    PAF (paroxysmal atrial fibrillation): Rate controlled. Eliquis discontinued for now. See #1.   Obesity: BMI 30.3 nutritional consult    GERD (gastroesophageal reflux disease): Stable at baseline   CAD (coronary artery disease): complain CP last night during episode of delerium. EKG with ventricular paced rhythm no acute changes. Troponin negative. Denies chest pain. 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 Asymptomatic.    Chronic Systolic CHF: See #1. IVF discontinued. 80mg  lasix daily. Urine output 2060ml last 24 hours.   Code Status: full Family Communication: wife at bedside Disposition Plan: home hopefully 24-48  hours   Consultants:  Nephrology  neurology  Procedures:  none  Antibiotics:  none  HPI/Subjective: Awake alert. Denies pain/discomfort. Reports feeling "tired". Appetite good  Objective: Filed Vitals:   01/03/15 0558  BP: 115/65  Pulse: 86  Temp: 98.5 F (36.9 C)  Resp: 18    Intake/Output Summary (Last 24 hours) at 01/03/15 0934 Last data filed at 01/03/15 0916  Gross per 24 hour  Intake    246 ml  Output   2075 ml  Net  -1829 ml   Filed Weights   01/01/15 0631 01/02/15 0743 01/03/15 0558  Weight: 98.3 kg (216 lb 11.4 oz) 91.6 kg (201 lb 15.1 oz) 91.8 kg (202 lb 6.1 oz)    Exam:   General:  Well nourished calm cooperative   Cardiovascular: RRR No MGR no LE edema  Respiratory: normal effort BS clear bilaterally  Abdomen: soft +BS ostomy bag intact with gas  Musculoskeletal: no clubbing or cyanosis  Neuro: alert oriented calm cooperative no hallucinations Moves all extremities   Data Reviewed: Basic Metabolic Panel:  Recent Labs Lab 12/29/14 0629 12/30/14 0520 12/31/14 0524 01/01/15 0506 01/02/15 0708 01/03/15 0551  NA 133* 133* 134* 133* 136 136  K 4.8 4.6 4.6 4.3 4.3 4.0  CL 107 106 103 100 103 100  CO2 18* 18* 22 24 23 27   GLUCOSE 140* 111* 121* 129* 89 107*  BUN 48* 48* 54* 54* 58* 57*  CREATININE 3.96* 4.10* 4.34* 4.47* 4.98* 4.78*  CALCIUM 8.3* 8.7 8.7 9.0 9.0 8.7  MG 1.8  --   --   --   --   --    Liver Function Tests:  Recent Labs Lab 01/01/15 1356 01/03/15 0551  AST 47* 47*  ALT 23 25  ALKPHOS 69 63  BILITOT 0.7 1.0  PROT 7.2 7.1  ALBUMIN 2.8* 2.7*  No results for input(s): LIPASE, AMYLASE in the last 168 hours.  Recent Labs Lab 01/01/15 1356  AMMONIA 34*   CBC:  Recent Labs Lab 12/31/14 0524 01/02/15 0708 01/03/15 0551  WBC 7.4 7.1 7.1  HGB 9.9* 9.6* 10.0*  HCT 30.2* 29.3* 30.7*  MCV 90.4 91.0 91.1  PLT 175 218 217   Cardiac Enzymes:  Recent Labs Lab 01/01/15 1514  CKTOTAL 77  TROPONINI  0.03   BNP (last 3 results)  Recent Labs  07/18/14 0555 09/27/14 0523 11/19/14 0745  PROBNP 1116.0* 9164.0* 5233.0*   CBG:  Recent Labs Lab 01/02/15 0739 01/02/15 1110 01/02/15 1653 01/02/15 2007 01/03/15 0802  GLUCAP 79 84 85 125* 97    No results found for this or any previous visit (from the past 240 hour(s)).   Studies: Ct Head Wo Contrast  01/01/2015   CLINICAL DATA:  Subsequent evaluation of subarachnoid hemorrhage, fell on January 19th, patient complaining of headache and altered mental status  EXAM: CT HEAD WITHOUT CONTRAST  TECHNIQUE: Contiguous axial images were obtained from the base of the skull through the vertex without intravenous contrast.  COMPARISON:  12/28/2014, 12/25/2014  FINDINGS: The study is limited by mild motion artifact. The extra-axial fluid previously seen in the quadrigeminal and ambient cisterns is not identified currently suggesting complete resolution. There is no parenchymal hemorrhage or extra-axial fluid on the current examination. There is no evidence of infarct or mass. There is no hydrocephalus. Stable mild atrophy. The calvarium is intact. Visualized portions of the paranasal sinuses unremarkable. Atherosclerotic vascular calcifications again identified.  IMPRESSION: No acute findings. Interval resolution of previous extra-axial hemorrhage.   Electronically Signed   By: Skipper Cliche M.D.   On: 01/01/2015 16:15    Scheduled Meds: . amiodarone  200 mg Oral Daily  . docusate sodium  100 mg Oral BID  . feeding supplement (GLUCERNA SHAKE)  237 mL Oral BID BM  . folic acid  1 mg Oral Daily  . furosemide  40 mg Oral Daily  . insulin aspart  0-15 Units Subcutaneous TID WC  . insulin aspart  0-5 Units Subcutaneous QHS  . insulin aspart  3 Units Subcutaneous TID WC  . insulin glargine  12 Units Subcutaneous QHS  . metoprolol succinate  50 mg Oral Daily  . pantoprazole  40 mg Oral Daily  . rosuvastatin  40 mg Oral QHS  . sodium bicarbonate   650 mg Oral BID  . sodium chloride  3 mL Intravenous Q12H   Continuous Infusions:   Principal Problem:   SAH (subarachnoid hemorrhage) Active Problems:   Diabetes mellitus, type II   Essential hypertension   PAF (paroxysmal atrial fibrillation)   Obesity   Acute-on-chronic renal failure   DM type 2, uncontrolled, with renal complications   GERD (gastroesophageal reflux disease)   CAD (coronary artery disease)   Acute renal failure   Dehydration   Chronic systolic congestive heart failure   Acute renal failure syndrome   Abdominal pain, generalized   Metabolic acidosis   Acute encephalopathy   Fall    Time spent: 35 minutes    [facility] Pager [phone]. If 7PM-7AM, please contact night-coverage at [email or web address], password [facility] 01/03/2015, 9:34 AM  LOS: 10 days

## 2015-01-03 NOTE — Progress Notes (Signed)
Informed by Dr that cardiologist will be consulted on EKG result.

## 2015-01-03 NOTE — Progress Notes (Signed)
Informed by CCMD that patient was in [place]. Pt's vitals are as follows Pulse 106, B/P 98/63, Respirations 20, O2 99 on room air. Pt assessed and no report of chest discomfort or pain. Dr made aware and received verbal order with readback for 12 lead EKG. Respiratory informed of order for EKG.  No further instructions at this time.

## 2015-01-03 NOTE — Progress Notes (Signed)
Subjective: Interval History: Patient is alert today. He is answering questions. Presently he denies any difficulty breathing, no nausea or vomiting. Patient offers no complaint today. Objective: Vital signs in last 24 hours: Temp:  [97.7 F (36.5 C)-99 F (37.2 C)] 98.5 F (36.9 C) (01/29 0558) Pulse Rate:  [80-90] 86 (01/29 0558) Resp:  [18-20] 18 (01/29 0558) BP: (100-115)/(52-72) 115/65 mmHg (01/29 0558) SpO2:  [92 %-97 %] 96 % (01/29 0558) Weight:  [91.8 kg (202 lb 6.1 oz)] 91.8 kg (202 lb 6.1 oz) (01/29 0558) Weight change:   Intake/Output from previous day: 01/28 0701 - 01/29 0700 In: 243 [P.O.:240; I.V.:3] Out: 2075 [Urine:2075] Intake/Output this shift:    Generally patient is alert and in no apparent distress. Chest is clear to auscultation His heart exam revealed regular rate and rhythm no murmur Abdomen soft positive bowel sounds Extremities no edema  Lab Results:  Recent Labs  01/02/15 0708 01/03/15 0551  WBC 7.1 7.1  HGB 9.6* 10.0*  HCT 29.3* 30.7*  PLT 218 217   BMET:   Recent Labs  01/02/15 0708 01/03/15 0551  NA 136 136  K 4.3 4.0  CL 103 100  CO2 23 27  GLUCOSE 89 107*  BUN 58* 57*  CREATININE 4.98* 4.78*  CALCIUM 9.0 8.7   No results for input(s): PTH in the last 72 hours. Iron Studies: No results for input(s): IRON, TIBC, TRANSFERRIN, FERRITIN in the last 72 hours.  Studies/Results: Ct Head Wo Contrast  01/01/2015   CLINICAL DATA:  Subsequent evaluation of subarachnoid hemorrhage, fell on January 19th, patient complaining of headache and altered mental status  EXAM: CT HEAD WITHOUT CONTRAST  TECHNIQUE: Contiguous axial images were obtained from the base of the skull through the vertex without intravenous contrast.  COMPARISON:  12/28/2014, 12/25/2014  FINDINGS: The study is limited by mild motion artifact. The extra-axial fluid previously seen in the quadrigeminal and ambient cisterns is not identified currently suggesting complete  resolution. There is no parenchymal hemorrhage or extra-axial fluid on the current examination. There is no evidence of infarct or mass. There is no hydrocephalus. Stable mild atrophy. The calvarium is intact. Visualized portions of the paranasal sinuses unremarkable. Atherosclerotic vascular calcifications again identified.  IMPRESSION: No acute findings. Interval resolution of previous extra-axial hemorrhage.   Electronically Signed   By: Skipper Cliche M.D.   On: 01/01/2015 16:15    I have reviewed the patient's current medications.  Assessment/Plan: Problem #1 acute kidney injury: History function today showing a slight improvement. Patient does not have any uremic signs and symptoms. Patient is non-oliguric. Problem #2 diabetes: He is on insulin and his blood glucose seems reasonable Problem #3 history of hypotension. Patient is blood pressure is low normal but stable. Problem #4 of atrial fibrillation : Patient had a few runs of V. tach. 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 Problem #5 increased confusion: Possibly metabolic/medication. His mental status has improved significantly. Presently seems to have returned to his baseline. Problem #6 rectal cancer Problem#7 History of CHF : Patient had 2000 mL of urine output. Patient on Lasix and no sign of fluid overload. Problem #8 metabolic acidosis: Patient also didn't bicarbonate and his CO2 is 27 has improved. Problem #9 anemia: His hemoglobin is stable. Plan: We'll continue with present management. We'll check his basic metabolic panel in the morning.    LOS: 10 days   Otis Burress S 01/03/2015,8:08 AM

## 2015-01-03 NOTE — Progress Notes (Signed)
Pt expressed a complaint of a sore throat. Pt assessed and no redness noted. Tongue pink and dry.  Dr. made aware and has assessed patient. No further instructions at this time. Will continue to monitor patient.

## 2015-01-03 NOTE — Progress Notes (Signed)
ICD interrogation reviewed and discussed with [employer].  Interrogation reveals normal BiV ICD function however BiV pacing is altered by atrial arrhythmias.  The patient is in atrial fibriillation/ atrial futter.  This rhythm has been present since Feb 2015.  Average V rates are 70-120 bpm with only 52% BiV pacing.  No ventricular arrhythmias are detected.  No changes are made.  EKG in epic reveals atrial flutter with RBBB.  QRS widening/ QT prolongation are likely due to acute illness/ metabolic phenomenon.    Patient is presently not a candidate for anticoagulation due to [place].  Would continue rate control.  Consider switching metoprolol succinate to lopressor 25mg  Q6 hours and titrate as BP allows.

## 2015-01-03 NOTE — Clinical Social Work Note (Signed)
Pt was much improved today with PT and is alert and oriented. CSW discussed with pt at bedside and he plans to return home with home health. CSW will sign off.  [name], [name]

## 2015-01-03 NOTE — Evaluation (Signed)
Physical Therapy Evaluation Patient Details Name: [name] MRN: [id number] DOB: January 09, 1956 Today's Date: 01/03/2015   History of Present Illness  Pt was admitted on 12-24-14 secondary to acute on chronic renal failure and a fall at home which resulted in a subarachnoid hemmorhage.  He has multiple medical problems to include an ischemic cardiomyopathy with an EF of 15%, DM Afib,.  He was initailly evaluated and found to have fairly good functional mobility with no assistive device.  Since that time he developed severe dizziness and balance disorder which has been found to be a metabolic encephalopathy.  With medication adjustment, this is resolving and we are asked to reassess his function.  Clinical Impression  Pt has made remarkable improvement since the date last seen.  He is alert and oriented, able to follow all directions.  His strength is WNL and sitting balance is good.  His standing balance is mildly decreased and he currently needs a walker for gait.  Gait endurance is good, able to ambulate 300' with good stability.  He would benefit from HHPT at d/c and will need a rolling walker.    Follow Up Recommendations Home health PT    Equipment Recommendations  Rolling walker with 5" wheels    Recommendations for Other Services   none    Precautions / Restrictions Precautions Precautions: Fall Restrictions Weight Bearing Restrictions: No      Mobility  Bed Mobility Overal bed mobility: Independent                Transfers Overall transfer level: Modified independent Equipment used: Rolling walker (2 wheeled) Transfers: Sit to/from Stand Sit to Stand: Modified independent (Device/Increase time)         General transfer comment: pt had very mild dizziness upon standing which did not affect his mobility  Ambulation/Gait Ambulation/Gait assistance: Supervision Ambulation Distance (Feet): 300 Feet Assistive device: Rolling walker (2 wheeled) Gait  Pattern/deviations: WFL(Within Functional Limits)   Gait velocity interpretation: at or above normal speed for age/gender    Stairs            Wheelchair Mobility    Modified Rankin (Stroke Patients Only)       Balance   Sitting-balance support: No upper extremity supported;Feet supported Sitting balance-Leahy Scale: Normal     Standing balance support: No upper extremity supported Standing balance-Leahy Scale: Fair                               Pertinent Vitals/Pain Pain Assessment: No/denies pain    Home Living Family/patient expects to be discharged to:: Private residence Living Arrangements: Spouse/significant other Available Help at Discharge: Family;Available 24 hours/day Type of Home: Mobile home Home Access: Stairs to enter Entrance Stairs-Rails: Can reach both Entrance Stairs-Number of Steps: 1 Home Layout: One level Home Equipment: None      Prior Function Level of Independence: Independent               Hand Dominance        Extremity/Trunk Assessment               Lower Extremity Assessment: Overall WFL for tasks assessed         Communication   Communication: No difficulties  Cognition Arousal/Alertness: Awake/alert Behavior During Therapy: WFL for tasks assessed/performed Overall Cognitive Status: Within Functional Limits for tasks assessed  General Comments      Exercises        Assessment/Plan    PT Assessment All further PT needs can be met in the next venue of care  PT Diagnosis Difficulty walking   PT Problem List Decreased activity tolerance;Decreased balance  PT Treatment Interventions     PT Goals (Current goals can be found in the Care Plan section) Acute Rehab PT Goals PT Goal Formulation: All assessment and education complete, DC therapy    Frequency     Barriers to discharge   none    Co-evaluation               End of Session Equipment  Utilized During Treatment: Gait belt Activity Tolerance: Patient tolerated treatment well Patient left: in chair;with call bell/phone within reach;with chair alarm set           Time: 1434-1510 PT Time Calculation (min) (ACUTE ONLY): 36 min   Charges:   PT Evaluation $PT Re-evaluation: 1 Procedure     PT G CodesDemetrios Isaacs L 01/03/2015, 3:20 PM

## 2015-01-03 NOTE — Progress Notes (Signed)
Patient ID: LEWIN DANGER, male   DOB: 14-Aug-1956, 59 y.o.   MRN: [id number]   [facility] NEUROLOGY Devaney Segers A. Gerilyn Pilgrim, MD     [email or web address]          KOSTAS VENEZIANO is an 59 y.o. male.   Assessment/Plan: Resolved - Acute encephalopathy with agitation. Undoubtedly, this is a toxic metabolic phenomenon. I acute renal failure is most likely culprit. The uremia however is causing the patient's medications not to be cleared and these medications are building up in the patient's system. Two medications are particularly concerning. They are RANEXA AND CELEXA. RANEXA has been reported to cause renal failure, confusion and tremors all of which the patient has. Celexa is known to cause tremor and confusion especially in the setting of acute renal failure. I believe we should discontinue both of these medications. Continue to avoid psychotropic medications.     Dramatically improved cognition - likely at baseline   GENERAL: He is resting in bed but somewhat agitated and restless.   HEENT: Supple. Atraumatic normocephalic.   ABDOMEN: soft  EXTREMITIES: No edema   BACK: Normal.  SKIN: Normal by inspection.   MENTAL STATUS: Alert sitting up in chair eating. Oriented x 3. Follows commands. Lucid.  CRANIAL NERVES: Pupils are equal, round and reactive to light and accommodation; extra ocular movements are full, there is no significant nystagmus; visual fields are full; upper and lower facial muscles are normal in strength and symmetric, there is no flattening of the nasolabial folds; tongue is midline; uvula is midline; shoulder elevation is normal.  MOTOR: Normal tone, bulk and strength; no pronator drift.  COORDINATION: Left finger to nose is normal, right finger to nose is normal, No rest tremor; no intention tremor; no postural tremor; no bradykinesia. However, the patient has frequent body jerks and twitches all over. These are not fasciculations but quit jerky movements and  fidgets.  REFLEXES: Deep tendon reflexes are symmetrical and normal. Babinski reflexes are flexor bilaterally.   SENSATION: Normal to light touch.     Objective: Vital signs in last 24 hours: Temp:  [97.7 F (36.5 C)-98.5 F (36.9 C)] 97.8 F (36.6 C) (01/29 1500) Pulse Rate:  [80-107] 107 (01/29 1500) Resp:  [18-20] 20 (01/29 1500) BP: (94-115)/(52-65) 94/56 mmHg (01/29 1500) SpO2:  [96 %-99 %] 99 % (01/29 1500) Weight:  [91.8 kg (202 lb 6.1 oz)] 91.8 kg (202 lb 6.1 oz) (01/29 0558)  Intake/Output from previous day: 01/28 0701 - 01/29 0700 In: 243 [P.O.:240; I.V.:3] Out: 2075 [Urine:2075] Intake/Output this shift: Total I/O In: 323 [P.O.:320; I.V.:3] Out: 400 [Urine:400] Nutritional status: Diet Carb Modified   Lab Results: Results for orders placed or performed during the hospital encounter of 12/24/14 (from the past 48 hour(s))  Glucose, capillary     Status: Abnormal   Collection Time: 01/01/15  8:23 PM  Result Value Ref Range   Glucose-Capillary 104 (H) 70 - 99 mg/dL   Comment 1 Notify RN   Urinalysis, Routine w reflex microscopic     Status: Abnormal   Collection Time: 01/01/15 11:16 PM  Result Value Ref Range   Color, Urine YELLOW YELLOW   APPearance CLEAR CLEAR   Specific Gravity, Urine 1.015 1.005 - 1.030   pH 5.5 5.0 - 8.0   Glucose, UA NEGATIVE NEGATIVE mg/dL   Hgb urine dipstick TRACE (A) NEGATIVE   Bilirubin Urine NEGATIVE NEGATIVE   Ketones, ur NEGATIVE NEGATIVE mg/dL   Protein, ur NEGATIVE NEGATIVE mg/dL   Urobilinogen, UA 0.2  0.0 - 1.0 mg/dL   Nitrite NEGATIVE NEGATIVE   Leukocytes, UA NEGATIVE NEGATIVE  Urine microscopic-add on     Status: Abnormal   Collection Time: 01/01/15 11:16 PM  Result Value Ref Range   Squamous Epithelial / LPF FEW (A) RARE   WBC, UA 0-2 <3 WBC/hpf   RBC / HPF 0-2 <3 RBC/hpf   Bacteria, UA MANY (A) RARE  CBC     Status: Abnormal   Collection Time: 01/02/15  7:08 AM  Result Value Ref Range   WBC 7.1 4.0 - 10.5  K/uL   RBC 3.22 (L) 4.22 - 5.81 MIL/uL   Hemoglobin 9.6 (L) 13.0 - 17.0 g/dL   HCT 40.9 (L) 81.1 - 91.4 %   MCV 91.0 78.0 - 100.0 fL   MCH 29.8 26.0 - 34.0 pg   MCHC 32.8 30.0 - 36.0 g/dL   RDW 78.2 95.6 - 21.3 %   Platelets 218 150 - 400 K/uL  Basic metabolic panel     Status: Abnormal   Collection Time: 01/02/15  7:08 AM  Result Value Ref Range   Sodium 136 135 - 145 mmol/L   Potassium 4.3 3.5 - 5.1 mmol/L   Chloride 103 96 - 112 mmol/L   CO2 23 19 - 32 mmol/L   Glucose, Bld 89 70 - 99 mg/dL   BUN 58 (H) 6 - 23 mg/dL   Creatinine, Ser 0.86 (H) 0.50 - 1.35 mg/dL   Calcium 9.0 8.4 - 57.8 mg/dL   GFR calc non Af Amer 11 (L) >90 mL/min   GFR calc Af Amer 13 (L) >90 mL/min    Comment: (NOTE) The eGFR has been calculated using the CKD EPI equation. This calculation has not been validated in all clinical situations. eGFR's persistently <90 mL/min signify possible Chronic Kidney Disease.    Anion gap 10 5 - 15  Glucose, capillary     Status: None   Collection Time: 01/02/15  7:39 AM  Result Value Ref Range   Glucose-Capillary 79 70 - 99 mg/dL  Glucose, capillary     Status: None   Collection Time: 01/02/15 11:10 AM  Result Value Ref Range   Glucose-Capillary 84 70 - 99 mg/dL   Comment 1 Notify RN   Glucose, capillary     Status: None   Collection Time: 01/02/15  4:53 PM  Result Value Ref Range   Glucose-Capillary 85 70 - 99 mg/dL   Comment 1 Notify RN   Glucose, capillary     Status: Abnormal   Collection Time: 01/02/15  8:07 PM  Result Value Ref Range   Glucose-Capillary 125 (H) 70 - 99 mg/dL   Comment 1 Documented in Chart    Comment 2 Notify RN   CBC     Status: Abnormal   Collection Time: 01/03/15  5:51 AM  Result Value Ref Range   WBC 7.1 4.0 - 10.5 K/uL   RBC 3.37 (L) 4.22 - 5.81 MIL/uL   Hemoglobin 10.0 (L) 13.0 - 17.0 g/dL   HCT 46.9 (L) 62.9 - 52.8 %   MCV 91.1 78.0 - 100.0 fL   MCH 29.7 26.0 - 34.0 pg   MCHC 32.6 30.0 - 36.0 g/dL   RDW 41.3 (H) 24.4 -  15.5 %   Platelets 217 150 - 400 K/uL  Comprehensive metabolic panel     Status: Abnormal   Collection Time: 01/03/15  5:51 AM  Result Value Ref Range   Sodium 136 135 - 145 mmol/L  Potassium 4.0 3.5 - 5.1 mmol/L   Chloride 100 96 - 112 mmol/L   CO2 27 19 - 32 mmol/L   Glucose, Bld 107 (H) 70 - 99 mg/dL   BUN 57 (H) 6 - 23 mg/dL   Creatinine, Ser 1.61 (H) 0.50 - 1.35 mg/dL   Calcium 8.7 8.4 - 09.6 mg/dL   Total Protein 7.1 6.0 - 8.3 g/dL   Albumin 2.7 (L) 3.5 - 5.2 g/dL   AST 47 (H) 0 - 37 U/L   ALT 25 0 - 53 U/L   Alkaline Phosphatase 63 39 - 117 U/L   Total Bilirubin 1.0 0.3 - 1.2 mg/dL   GFR calc non Af Amer 12 (L) >90 mL/min   GFR calc Af Amer 14 (L) >90 mL/min    Comment: (NOTE) The eGFR has been calculated using the CKD EPI equation. This calculation has not been validated in all clinical situations. eGFR's persistently <90 mL/min signify possible Chronic Kidney Disease.    Anion gap 9 5 - 15  Glucose, capillary     Status: None   Collection Time: 01/03/15  8:02 AM  Result Value Ref Range   Glucose-Capillary 97 70 - 99 mg/dL   Comment 1 Notify RN   Glucose, capillary     Status: Abnormal   Collection Time: 01/03/15 11:49 AM  Result Value Ref Range   Glucose-Capillary 163 (H) 70 - 99 mg/dL   Comment 1 Notify RN     Lipid Panel No results for input(s): CHOL, TRIG, HDL, CHOLHDL, VLDL, LDLCALC in the last 72 hours.  Studies/Results:   EEG 1. This recording is markedly abnormal showing severe generalized slowing indicating a severe generalized encephalopathy. There is also a moderate amount of triphasic waves. Triphasic waves are seen in hepatic and renal encephalopathies. No epileptiform activities are observed.  HEAD CT: Brain CT scan is reviewed in person and shows resolution of perimesencephalic small hemorrhage. Nothing acute is seen on repeat scan.  Medications:  Scheduled Meds: . amiodarone  200 mg Oral Daily  . docusate sodium  100 mg Oral BID  .  feeding supplement (GLUCERNA SHAKE)  237 mL Oral BID BM  . folic acid  1 mg Oral Daily  . furosemide  40 mg Oral Daily  . insulin aspart  0-15 Units Subcutaneous TID WC  . insulin aspart  0-5 Units Subcutaneous QHS  . insulin aspart  3 Units Subcutaneous TID WC  . insulin glargine  12 Units Subcutaneous QHS  . metoprolol succinate  50 mg Oral Daily  . pantoprazole  40 mg Oral Daily  . rosuvastatin  40 mg Oral QHS  . sodium bicarbonate  650 mg Oral BID  . sodium chloride  3 mL Intravenous Q12H   Continuous Infusions:  PRN Meds:.sodium chloride, acetaminophen, fluticasone, isosorbide dinitrate, ondansetron (ZOFRAN) IV, sodium chloride     LOS: 10 days   Genene Kilman A. Gerilyn Pilgrim, M.D.  Diplomate, Biomedical engineer of Psychiatry and Neurology ( Neurology).

## 2015-01-04 LAB — GLUCOSE, CAPILLARY
GLUCOSE-CAPILLARY: 173 mg/dL — AB (ref 70–99)
Glucose-Capillary: 128 mg/dL — ABNORMAL HIGH (ref 70–99)
Glucose-Capillary: 140 mg/dL — ABNORMAL HIGH (ref 70–99)
Glucose-Capillary: 99 mg/dL (ref 70–99)
Glucose-Capillary: 198 mg/dL — ABNORMAL HIGH (ref 70–99)

## 2015-01-04 LAB — BASIC METABOLIC PANEL
ANION GAP: 11 (ref 5–15)
BUN: 58 mg/dL — AB (ref 6–23)
CHLORIDE: 99 mmol/L (ref 96–112)
CO2: 27 mmol/L (ref 19–32)
Calcium: 8.7 mg/dL (ref 8.4–10.5)
Creatinine, Ser: 4.5 mg/dL — ABNORMAL HIGH (ref 0.50–1.35)
GFR calc Af Amer: 15 mL/min — ABNORMAL LOW (ref >90)
GFR calc non Af Amer: 13 mL/min — ABNORMAL LOW (ref >90)
GLUCOSE: 131 mg/dL — AB (ref 70–99)
POTASSIUM: 4 mmol/L (ref 3.5–5.1)
SODIUM: 137 mmol/L (ref 135–145)

## 2015-01-04 MED ORDER — METOPROLOL TARTRATE 25 MG PO TABS
25.0000 mg | ORAL_TABLET | Freq: Four times a day (QID) | ORAL | Status: DC
Start: 2015-01-04 — End: 2015-01-06
  Administered 2015-01-04 – 2015-01-06 (×8): 25 mg via ORAL
  Filled 2015-01-04 (×9): qty 1

## 2015-01-04 MED ORDER — SODIUM CHLORIDE 0.9 % IV SOLN
INTRAVENOUS | Status: DC
  Administered 2015-01-04: 10:00:00 via INTRAVENOUS

## 2015-01-04 NOTE — Progress Notes (Signed)
ANTICOAGULATION CONSULT NOTE - Initial Consult  Pharmacy Consult for Eliquis (chronic Rx PTA) Indication: atrial fibrillation  No Known Allergies  Patient Measurements: Height: 5\' 9"  (175.3 cm) Weight: 207 lb 0.2 oz (93.9 kg) IBW/kg (Calculated) : 70.7  Vital Signs: Temp: 97.6 F (36.4 C) (01/30 0824) Temp Source: Oral (01/30 0824) BP: 100/61 mmHg (01/30 0831) Pulse Rate: 80 (01/30 0800)  Labs:  Recent Labs  01/01/15 1514 01/02/15 0708 01/03/15 0551 01/04/15 0625  HGB  --  9.6* 10.0*  --   HCT  --  29.3* 30.7*  --   PLT  --  218 217  --   CREATININE  --  4.98* 4.78* 4.50*  CKTOTAL 77  --   --   --   TROPONINI 0.03  --   --   --    Estimated Creatinine Clearance: 19.8 mL/min (by C-G formula based on Cr of 4.5).  Medical History: Past Medical History  Diagnosis Date  . Essential hypertension, benign   . Type 2 diabetes mellitus   . CAD (coronary artery disease)     a. BMS to LAD 2001 [facility] b. PTCA/atherectomy ramus and BMS to LAD 2009  . Chronic systolic heart failure     a. 2D ECHO: 03/01/2014 EF 15-20%, severe, diffuse hypsokinesis. Mod LV dilation. Mod LVH, indeterminate diastolic dysfxn (poor study), high vent filling pressures, abnormal septal wall motion and dyssynergy c/w RV pacing, mild MR, mod LA dilation, RV systolic function mod reduced, mild TR, PA pressure 42.   Marland Kitchen GERD (gastroesophageal reflux disease)   . Cardiomyopathy, ischemic     a. BIV ICD St. Jude, LVEF 23%  . Paroxysmal atrial fibrillation     a. on amiodarone, digoxin and Eliquis  . Adenocarcinoma of rectum     a. 2008  . Prostate cancer     a. s/p seed implants with chemo and radiation  . 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 St Jude  . TIA (transient ischemic attack)   . OSA on CPAP   . HLD (hyperlipidemia)   . Orthostatic hypotension   . Dizziness     a. chronic. Admission for this 07/18/2014  . History of falling July 2015    due to dizziness from  medications  . CHF (congestive heart failure)   . Renal insufficiency    Medications:  Prescriptions prior to admission  Medication Sig Dispense Refill Last Dose  . amiodarone (PACERONE) 200 MG tablet Take 1 tablet (200 mg total) by mouth daily. 30 tablet 0 12/23/2014 at Unknown time  . apixaban (ELIQUIS) 5 MG TABS tablet Take 5 mg by mouth 2 (two) times daily.   12/23/2014 at Unknown time  . aspirin EC 81 MG tablet Take 81 mg by mouth at bedtime.   12/23/2014 at Unknown time  . citalopram (CELEXA) 20 MG tablet Take 1 tablet (20 mg total) by mouth daily. 30 tablet 0 12/23/2014 at Unknown time  . co-enzyme Q-10 50 MG capsule Take 50 mg by mouth every morning.    12/23/2014 at Unknown time  . digoxin (LANOXIN) 0.125 MG tablet Take 1 tablet (0.125 mg total) by mouth every other day. 30 tablet 6 12/23/2014 at Unknown time  . enalapril (VASOTEC) 2.5 MG tablet Take 1 tablet (2.5 mg total) by mouth daily. 30 tablet 1 12/23/2014 at Unknown time  . fluticasone (FLONASE) 50 MCG/ACT nasal spray Place 1 spray into both nostrils daily as needed for allergies.    Unknown  at Unknown time  . furosemide (LASIX) 20 MG tablet Take 1 tablet (20 mg total) by mouth daily as needed (swelling or shortness of breath). 30 tablet 2 Unknown  . isosorbide dinitrate (ISORDIL) 30 MG tablet Take 15 mg by mouth 2 (two) times daily as needed (shortness of breath).    Unknown  . meclizine (ANTIVERT) 25 MG tablet Take 25 mg by mouth as needed for dizziness.   Unknown  . metoprolol succinate (TOPROL-XL) 100 MG 24 hr tablet Take 100 mg by mouth daily. Take with or immediately following a meal.   12/23/2014 at 1000  . niacin (NIASPAN) 1000 MG CR tablet Take 1,000 mg by mouth at bedtime.     12/23/2014 at Unknown time  . nitroGLYCERIN (NITROLINGUAL) 0.4 MG/SPRAY spray Place 1 spray under the tongue every 5 (five) minutes as needed. angina 12 g 3 Unknown  . Omega-3 Fatty Acids (FISH OIL) 1200 MG CAPS Take 1,200 mg by mouth 2 (two) times daily.      12/23/2014 at Unknown time  . pantoprazole (PROTONIX) 40 MG tablet Take 1 tablet (40 mg total) by mouth daily. 30 tablet 6 12/23/2014 at Unknown time  . ranolazine (RANEXA) 1000 MG SR tablet Take 1 tablet (1,000 mg total) by mouth 2 (two) times daily. 60 tablet 9 12/23/2014 at Unknown time  . rosuvastatin (CRESTOR) 40 MG tablet Take 1 tablet (40 mg total) by mouth at bedtime. 90 tablet 3 12/23/2014 at Unknown time  . spironolactone (ALDACTONE) 25 MG tablet Take 12.5 mg by mouth daily.   12/23/2014 at Unknown time  . insulin aspart (NOVOLOG FLEXPEN) 100 UNIT/ML FlexPen Inject 6 Units into the skin 3 (three) times daily with meals. (Patient not taking: Reported on 12/24/2014) 15 mL 11 Not Taking at Unknown time  . Insulin Glargine (LANTUS) 100 UNIT/ML Solostar Pen Inject 20 Units into the skin daily at 10 pm. (Patient not taking: Reported on 12/24/2014) 15 mL 11 Not Taking at Unknown time  . metoprolol succinate (TOPROL-XL) 25 MG 24 hr tablet Take 1 tablet (25 mg total) by mouth 2 (two) times daily. (Patient not taking: Reported on 12/24/2014) 60 tablet 2 Not Taking at Unknown time  . peg 3350 powder (MOVIPREP) 100 G SOLR Take 1 kit (200 g total) by mouth as directed. (Patient not taking: Reported on 12/24/2014) 1 kit 0 Not Taking at Unknown time    Assessment: 59yo male on Apixaban $RemoveBef'5mg'ktgPsILeeb$  po BID for h/o afib.  Asked to resume Eliquis.  Goal of Therapy:  Anticoagulation for afib with Eliquis Monitor platelets by anticoagulation protocol: Yes   Plan:   Resume Eliquis $RemoveBefore'5mg'[id number]$  po BID  Monitor for s/sx of bleeding complications  [place] Crane, Lennis Rader A 01/04/2015,9:11 AM

## 2015-01-04 NOTE — Progress Notes (Signed)
[facility] PROGRESS NOTE  [name] YIR:[id number] DOB: [birth year] DOA: 12/24/2014 PCP: Carlyle Dolly, F, MD  Assessment/Plan: Acute encephalopathy  Felt to be related to decreased clearance of medications in the setting of worsening renal failure. Celexa and Ranexa were held, both of which can cause somnolence/agitation. Mental status appears to have returned to baseline .   Acute on chronic kidney failure stage III.Creatinine trending downward slowly. Continues with good urine output. Continue po lasix. Appreciate assistance of nephrology who opine ATN vs prerenal.Continue to monitor.   SAH (subarachnoid hemorrhage): Secondary to mechanical fall in setting of aspirin and Eliquis. CT was done revealing interval near-complete resolution of blood in basilar cisterns. Neurology has been following the patient and feels that it is reasonable to resume Eliquis.  Metabolic acidosis Resolved. Continue bicarb supplement   Abdominal pain: related to constipation. resolved    Diabetes mellitus, type II: Hemoglobin A1c 10.6. Blood sugars are stable. Continue half of his Lantus dose and provide sliding scale as well as meal coverage for optimal control.    PAF (paroxysmal atrial fibrillation): Rate controlled. Anticoagulated with Eliquis. Patient developed a wide complex tachycardia yesterday. EKG was reviewed with cardiology and it was felt that patient likely had atrial fibrillation with aberrancy vs. Slow VT.  His ICD was interrogated by [facility]. Jude rep and discussed with Dr. Rayann Heman, on call for electrophysiology. Appreciate his input. Interrogation revealed that patient was in atrial fib/flutter and no ventricular arrythmias were noted. Recommendations were to change toprol 50mg  daily to lopressor 25mg  q6h for easier titration.   Obesity: BMI 30.3 nutritional consult    GERD (gastroesophageal reflux disease): Stable at baseline   CAD (coronary artery disease):  complain CP last night during episode of delerium.  Troponin negative. Denies chest pain.     Chronic Systolic CHF: Appears to be compensated at this time. Continue lasix 40mg  po daily.   Code Status: full Family Communication: discussed with patient Disposition Plan: home hopefully 24-48 hours   Consultants:  Nephrology  neurology  Procedures:  none  Antibiotics:  none  HPI/Subjective: No chest pain, shortness of breath.  Objective: Filed Vitals:   01/04/15 0831  BP: 100/61  Pulse:   Temp:   Resp:     Intake/Output Summary (Last 24 hours) at 01/04/15 0907 Last data filed at 01/04/15 0400  Gross per 24 hour  Intake    713 ml  Output   1850 ml  Net  -1137 ml   Filed Weights   01/02/15 0743 01/03/15 0558 01/04/15 0500  Weight: 91.6 kg (201 lb 15.1 oz) 91.8 kg (202 lb 6.1 oz) 93.9 kg (207 lb 0.2 oz)    Exam:   General:  Well nourished calm cooperative   Cardiovascular: RRR No MGR no LE edema  Respiratory: normal effort BS clear bilaterally  Abdomen: soft +BS ostomy bag intact with gas  Musculoskeletal: no clubbing or cyanosis  Neuro: alert oriented calm cooperative no hallucinations Moves all extremities   Data Reviewed: Basic Metabolic Panel:  Recent Labs Lab 12/29/14 0629  12/31/14 0524 01/01/15 0506 01/02/15 0708 01/03/15 0551 01/03/15 1837 01/04/15 0625  NA 133*  < > 134* 133* 136 136  --  137  K 4.8  < > 4.6 4.3 4.3 4.0 3.7 4.0  CL 107  < > 103 100 103 100  --  99  CO2 18*  < > 22 24 23 27   --  27  GLUCOSE 140*  < > 121* 129* 89 107*  --  131*  BUN 48*  < > 54* 54* 58* 57*  --  58*  CREATININE 3.96*  < > 4.34* 4.47* 4.98* 4.78*  --  4.50*  CALCIUM 8.3*  < > 8.7 9.0 9.0 8.7  --  8.7  MG 1.8  --   --   --   --   --  2.2  --   < > = values in this interval not displayed. Liver Function Tests:  Recent Labs Lab 01/01/15 1356 01/03/15 0551  AST 47* 47*  ALT 23 25  ALKPHOS 69 63  BILITOT 0.7 1.0  PROT 7.2 7.1  ALBUMIN  2.8* 2.7*   No results for input(s): LIPASE, AMYLASE in the last 168 hours.  Recent Labs Lab 01/01/15 1356  AMMONIA 34*   CBC:  Recent Labs Lab 12/31/14 0524 01/02/15 0708 01/03/15 0551  WBC 7.4 7.1 7.1  HGB 9.9* 9.6* 10.0*  HCT 30.2* 29.3* 30.7*  MCV 90.4 91.0 91.1  PLT 175 218 217   Cardiac Enzymes:  Recent Labs Lab 01/01/15 1514  CKTOTAL 77  TROPONINI 0.03   BNP (last 3 results)  Recent Labs  07/18/14 0555 09/27/14 0523 11/19/14 0745  PROBNP 1116.0* 9164.0* 5233.0*   CBG:  Recent Labs Lab 01/03/15 0802 01/03/15 1149 01/03/15 1658 01/04/15 0012 01/04/15 0730  GLUCAP 97 163* 209* 173* 128*    Recent Results (from the past 240 hour(s))  MRSA PCR Screening     Status: None   Collection Time: 01/03/15  7:30 PM  Result Value Ref Range Status   MRSA by PCR NEGATIVE NEGATIVE Final    Comment:        The GeneXpert MRSA Assay (FDA approved for NASAL specimens only), is one component of a comprehensive MRSA colonization surveillance program. It is not intended to diagnose MRSA infection nor to guide or monitor treatment for MRSA infections.      Studies: No results found.  Scheduled Meds: . amiodarone  200 mg Oral Daily  . apixaban  5 mg Oral BID  . docusate sodium  100 mg Oral BID  . feeding supplement (GLUCERNA SHAKE)  237 mL Oral BID BM  . folic acid  1 mg Oral Daily  . furosemide  40 mg Oral Daily  . insulin aspart  0-15 Units Subcutaneous TID WC  . insulin aspart  0-5 Units Subcutaneous QHS  . insulin aspart  3 Units Subcutaneous TID WC  . insulin glargine  12 Units Subcutaneous QHS  . metoprolol tartrate  25 mg Oral Q6H  . pantoprazole  40 mg Oral Daily  . rosuvastatin  40 mg Oral QHS  . sodium bicarbonate  650 mg Oral BID  . sodium chloride  3 mL Intravenous Q12H   Continuous Infusions:   Principal Problem:   SAH (subarachnoid hemorrhage) Active Problems:   Diabetes mellitus, type II   Essential hypertension   PAF  (paroxysmal atrial fibrillation)   Obesity   Acute-on-chronic renal failure   DM type 2, uncontrolled, with renal complications   GERD (gastroesophageal reflux disease)   CAD (coronary artery disease)   Acute renal failure   Dehydration   Chronic systolic congestive heart failure   Acute renal failure syndrome   Abdominal pain, generalized   Metabolic acidosis   Acute encephalopathy   Fall   Encephalopathy    Time spent: 25 minutes    [facility] Pager [phone]. If 7PM-7AM, please contact night-coverage at [email or web address], password [facility] 01/04/2015, 9:07 AM  LOS: 11  days

## 2015-01-04 NOTE — Progress Notes (Signed)
Subjective: Interval History: No complaint . Denies any difficulty in breathing and no nausea or vomiting Objective: Vital signs in last 24 hours: Temp:  [97.6 F (36.4 C)-98.3 F (36.8 C)] 97.6 F (36.4 C) (01/30 0824) Pulse Rate:  [77-186] 80 (01/30 0800) Resp:  [11-25] 14 (01/30 0800) BP: (75-108)/(56-84) 100/61 mmHg (01/30 0831) SpO2:  [92 %-100 %] 93 % (01/30 0800) Weight:  [93.9 kg (207 lb 0.2 oz)] 93.9 kg (207 lb 0.2 oz) (01/30 0500) Weight change: 2.3 kg (5 lb 1.1 oz)  Intake/Output from previous day: 01/29 0701 - 01/30 0700 In: 793 [P.O.:680; I.V.:113] Out: 1850 [Urine:1850] Intake/Output this shift: Total I/O In: -  Out: 475 [Urine:475]  Generally patient is alert and in no apparent distress. Chest is clear to auscultation His heart exam revealed regular rate and rhythm no murmur Abdomen soft positive bowel sounds Extremities no edema  Lab Results:  Recent Labs  01/02/15 0708 01/03/15 0551  WBC 7.1 7.1  HGB 9.6* 10.0*  HCT 29.3* 30.7*  PLT 218 217   BMET:   Recent Labs  01/03/15 0551 01/03/15 1837 01/04/15 0625  NA 136  --  137  K 4.0 3.7 4.0  CL 100  --  99  CO2 27  --  27  GLUCOSE 107*  --  131*  BUN 57*  --  58*  CREATININE 4.78*  --  4.50*  CALCIUM 8.7  --  8.7   No results for input(s): PTH in the last 72 hours. Iron Studies: No results for input(s): IRON, TIBC, TRANSFERRIN, FERRITIN in the last 72 hours.  Studies/Results: No results found.  I have reviewed the patient's current medications.  Assessment/Plan: Problem #1 acute kidney injury: History function continue to improve. Patient is asymptomatic Problem #2 diabetes: He is on insulin and his blood glucose seems reasonable Problem #3 history of hypotension. Patient is blood pressure is low normal but stable. Problem #4 of atrial fibrillation : Patient had tachycardia taught to be an  A. Fib  Problem #5  confusion: Possibly metabolic/medication. Has recovered Problem #6 rectal  cancer Problem#7 History of CHF : Patient had 1800 mL of urine output. Patient on Lasix and no sign of fluid overload. Problem #8 metabolic acidosis: Patient also didn't bicarbonate and his CO2 is 27 has improved. Problem #9 anemia: His hemoglobin is stable. Plan: We'll continue with present management. D/c sodium bicarbonate Ns at 50 cc/hr We'll check his basic metabolic panel in the morning.    LOS: 11 days   [name] S 01/04/2015,9:35 AM

## 2015-01-05 LAB — BASIC METABOLIC PANEL
Anion gap: 7 (ref 5–15)
BUN: 57 mg/dL — ABNORMAL HIGH (ref 6–23)
CO2: 27 mmol/L (ref 19–32)
CREATININE: 4.04 mg/dL — AB (ref 0.50–1.35)
Calcium: 8.6 mg/dL (ref 8.4–10.5)
Chloride: 102 mmol/L (ref 96–112)
GFR calc Af Amer: 17 mL/min — ABNORMAL LOW (ref >90)
GFR calc non Af Amer: 15 mL/min — ABNORMAL LOW (ref >90)
Glucose, Bld: 130 mg/dL — ABNORMAL HIGH (ref 70–99)
Potassium: 3.7 mmol/L (ref 3.5–5.1)
Sodium: 136 mmol/L (ref 135–145)

## 2015-01-05 LAB — GLUCOSE, CAPILLARY
GLUCOSE-CAPILLARY: 229 mg/dL — AB (ref 70–99)
Glucose-Capillary: 104 mg/dL — ABNORMAL HIGH (ref 70–99)
Glucose-Capillary: 162 mg/dL — ABNORMAL HIGH (ref 70–99)
Glucose-Capillary: 105 mg/dL — ABNORMAL HIGH (ref 70–99)

## 2015-01-05 MED ORDER — POTASSIUM CHLORIDE CRYS ER 20 MEQ PO TBCR
40.0000 meq | EXTENDED_RELEASE_TABLET | Freq: Once | ORAL | Status: AC
  Administered 2015-01-05: 40 meq via ORAL
  Filled 2015-01-05: qty 2

## 2015-01-05 NOTE — Progress Notes (Signed)
[facility] PROGRESS NOTE  [name] MPN:[id number] DOB: [birth year] DOA: 12/24/2014 PCP: Carlyle Dolly, F, MD  Assessment/Plan: Acute encephalopathy  Felt to be related to decreased clearance of medications in the setting of worsening renal failure. Celexa and Ranexa were held, both of which can cause somnolence/agitation. Mental status appears to have returned to baseline .   Acute on chronic kidney failure stage III.Creatinine trending downward slowly. Continues with good urine output. Continue po lasix. started on gentle IV hydration. Appreciate assistance of nephrology who opine ATN vs prerenal.Continue to monitor.   SAH (subarachnoid hemorrhage): Secondary to mechanical fall in setting of aspirin and Eliquis. CT was done revealing interval near-complete resolution of blood in basilar cisterns. Neurology has been following the patient and feels that it is reasonable to resume Eliquis.  Metabolic acidosis Resolved. Continue bicarb supplement   Abdominal pain: related to constipation. resolved    Diabetes mellitus, type II: Hemoglobin A1c 10.6. Blood sugars are stable. Continue half of his Lantus dose and provide sliding scale as well as meal coverage for optimal control.    PAF (paroxysmal atrial fibrillation): Rate controlled. Anticoagulated with Eliquis. Patient developed a wide complex tachycardia on 1/29. EKG was reviewed with cardiology and it was felt that patient likely had atrial fibrillation with aberrancy vs. Slow VT.  His ICD was interrogated by [facility]. [employer] rep and discussed with Dr. Rayann Heman, on call for electrophysiology. Appreciate his input. Interrogation revealed that patient was in atrial fib/flutter and no ventricular arrythmias were noted. Recommendations were to change toprol 50mg  daily to lopressor 25mg  q6h for easier titration. Patient appears to be tolerating this change. Repeat EKG today.   Obesity: BMI 30.3 nutritional consult    GERD  (gastroesophageal reflux disease): Stable at baseline   CAD (coronary artery disease):  Troponin negative. Denies chest pain.     Chronic Systolic CHF: Appears to be compensated at this time. Continue lasix 40mg  po daily.   Code Status: full Family Communication: discussed with patient Disposition Plan: home hopefully 24-48 hours   Consultants:  Nephrology  neurology  Procedures:  none  Antibiotics:  none  HPI/Subjective: No chest pain, shortness of breath. No new complaints  Objective: Filed Vitals:   01/05/15 0904  BP: 108/62  Pulse: 74  Temp:   Resp:     Intake/Output Summary (Last 24 hours) at 01/05/15 1037 Last data filed at 01/05/15 0350  Gross per 24 hour  Intake    990 ml  Output   2350 ml  Net  -1360 ml   Filed Weights   01/03/15 0558 01/04/15 0500 01/05/15 0500  Weight: 91.8 kg (202 lb 6.1 oz) 93.9 kg (207 lb 0.2 oz) 93.3 kg (205 lb 11 oz)    Exam:   General:  Well nourished calm cooperative   Cardiovascular: RRR No MGR 1+ LE edema  Respiratory: normal effort BS clear bilaterally  Abdomen: soft +BS ostomy bag intact with gas  Musculoskeletal: no clubbing or cyanosis  Neuro: alert oriented calm cooperative no hallucinations Moves all extremities   Data Reviewed: Basic Metabolic Panel:  Recent Labs Lab 01/01/15 0506 01/02/15 0708 01/03/15 0551 01/03/15 1837 01/04/15 0625 01/05/15 0614  NA 133* 136 136  --  137 136  K 4.3 4.3 4.0 3.7 4.0 3.7  CL 100 103 100  --  99 102  CO2 24 23 27   --  27 27  GLUCOSE 129* 89 107*  --  131* 130*  BUN 54* 58* 57*  --  58*  57*  CREATININE 4.47* 4.98* 4.78*  --  4.50* 4.04*  CALCIUM 9.0 9.0 8.7  --  8.7 8.6  MG  --   --   --  2.2  --   --    Liver Function Tests:  Recent Labs Lab 01/01/15 1356 01/03/15 0551  AST 47* 47*  ALT 23 25  ALKPHOS 69 63  BILITOT 0.7 1.0  PROT 7.2 7.1  ALBUMIN 2.8* 2.7*   No results for input(s): LIPASE, AMYLASE in the last 168 hours.  Recent  Labs Lab 01/01/15 1356  AMMONIA 34*   CBC:  Recent Labs Lab 12/31/14 0524 01/02/15 0708 01/03/15 0551  WBC 7.4 7.1 7.1  HGB 9.9* 9.6* 10.0*  HCT 30.2* 29.3* 30.7*  MCV 90.4 91.0 91.1  PLT 175 218 217   Cardiac Enzymes:  Recent Labs Lab 01/01/15 1514  CKTOTAL 77  TROPONINI 0.03   BNP (last 3 results)  Recent Labs  07/18/14 0555 09/27/14 0523 11/19/14 0745  PROBNP 1116.0* 9164.0* 5233.0*   CBG:  Recent Labs Lab 01/04/15 0730 01/04/15 1126 01/04/15 1635 01/04/15 2133 01/05/15 0818  GLUCAP 128* 140* 99 198* 104*    Recent Results (from the past 240 hour(s))  MRSA PCR Screening     Status: None   Collection Time: 01/03/15  7:30 PM  Result Value Ref Range Status   MRSA by PCR NEGATIVE NEGATIVE Final    Comment:        The GeneXpert MRSA Assay (FDA approved for NASAL specimens only), is one component of a comprehensive MRSA colonization surveillance program. It is not intended to diagnose MRSA infection nor to guide or monitor treatment for MRSA infections.      Studies: No results found.  Scheduled Meds: . amiodarone  200 mg Oral Daily  . apixaban  5 mg Oral BID  . docusate sodium  100 mg Oral BID  . feeding supplement (GLUCERNA SHAKE)  237 mL Oral BID BM  . folic acid  1 mg Oral Daily  . furosemide  40 mg Oral Daily  . insulin aspart  0-15 Units Subcutaneous TID WC  . insulin aspart  0-5 Units Subcutaneous QHS  . insulin aspart  3 Units Subcutaneous TID WC  . insulin glargine  12 Units Subcutaneous QHS  . metoprolol tartrate  25 mg Oral Q6H  . pantoprazole  40 mg Oral Daily  . rosuvastatin  40 mg Oral QHS  . sodium chloride  3 mL Intravenous Q12H   Continuous Infusions: . sodium chloride 50 mL/hr at 01/05/15 0100    Principal Problem:   SAH (subarachnoid hemorrhage) Active Problems:   Diabetes mellitus, type II   Essential hypertension   PAF (paroxysmal atrial fibrillation)   Obesity   Acute-on-chronic renal failure   DM type  2, uncontrolled, with renal complications   GERD (gastroesophageal reflux disease)   CAD (coronary artery disease)   Acute renal failure   Dehydration   Chronic systolic congestive heart failure   Acute renal failure syndrome   Abdominal pain, generalized   Metabolic acidosis   Acute encephalopathy   Fall   Encephalopathy    Time spent: 25 minutes    [facility] Pager [phone]. If 7PM-7AM, please contact night-coverage at [email or web address], password [facility] 01/05/2015, 10:37 AM  LOS: 12 days

## 2015-01-05 NOTE — Progress Notes (Signed)
Subjective: Interval History: Patient continued to feel better. He denies any difficulty breathing or orthopnea. His appetite is good and no complaints. Objective: Vital signs in last 24 hours: Temp:  [97.3 F (36.3 C)-97.9 F (36.6 C)] 97.5 F (36.4 C) (01/31 0400) Pulse Rate:  [71-102] 71 (01/31 0700) Resp:  [0-22] 11 (01/31 0700) BP: (84-107)/(46-80) 104/59 mmHg (01/31 0700) SpO2:  [90 %-100 %] 98 % (01/31 0700) Weight:  [93.3 kg (205 lb 11 oz)] 93.3 kg (205 lb 11 oz) (01/31 0500) Weight change: -0.6 kg (-1 lb 5.2 oz)  Intake/Output from previous day: 01/30 0701 - 01/31 0700 In: 996.7 [P.O.:240; I.V.:756.7] Out: 2825 [Urine:2825] Intake/Output this shift:    Generally patient is alert and in no apparent distress. Chest is clear to auscultation His heart exam revealed regular rate and rhythm no murmur Abdomen soft positive bowel sounds Extremities no edema  Lab Results:  Recent Labs  01/03/15 0551  WBC 7.1  HGB 10.0*  HCT 30.7*  PLT 217   BMET:   Recent Labs  01/04/15 0625 01/05/15 0614  NA 137 136  K 4.0 3.7  CL 99 102  CO2 27 27  GLUCOSE 131* 130*  BUN 58* 57*  CREATININE 4.50* 4.04*  CALCIUM 8.7 8.6   No results for input(s): PTH in the last 72 hours. Iron Studies: No results for input(s): IRON, TIBC, TRANSFERRIN, FERRITIN in the last 72 hours.  Studies/Results: No results found.  I have reviewed the patient's current medications.  Assessment/Plan: Problem #1 acute kidney injury: His BUN and creatinine continued to improve. His creatinine dropped to half a point. Presently patient is non-oliguric. Problem #2 diabetes: He is on insulin and no polyuria or polydipsia. Problem #3 history of hypotension. Patient is blood pressure is low normal but stable. Patient presently on Lopressor. Problem #4 of atrial fibrillation : Patient had tachycardia taught to be from  A. Fib . Presently patient on Lopressor and also amiodarone. His heart rate seems to be  better controlled. Problem #5  confusion: Possibly metabolic/medication. Has recovered Problem #6 rectal cancer Problem#7 History of CHF : Patient had 2800 mL of urine output. Patient on Lasix and no sign of fluid overload. Problem #8 metabolic acidosis: That has improved and patient is off sodium bicarbonate. Problem #9 anemia: His hemoglobin is stable. Plan: We'll continue with present management We'll check his basic metabolic panel in the morning.    LOS: 12 days   [name] S 01/05/2015,7:51 AM

## 2015-01-06 ENCOUNTER — Other Ambulatory Visit (HOSPITAL_COMMUNITY): Payer: Self-pay | Admitting: Radiology

## 2015-01-06 LAB — BASIC METABOLIC PANEL
Anion gap: 7 (ref 5–15)
BUN: 48 mg/dL — AB (ref 6–23)
CALCIUM: 8.6 mg/dL (ref 8.4–10.5)
CO2: 27 mmol/L (ref 19–32)
Chloride: 104 mmol/L (ref 96–112)
Creatinine, Ser: 3.55 mg/dL — ABNORMAL HIGH (ref 0.50–1.35)
GFR calc Af Amer: 20 mL/min — ABNORMAL LOW (ref >90)
GFR calc non Af Amer: 17 mL/min — ABNORMAL LOW (ref >90)
Glucose, Bld: 135 mg/dL — ABNORMAL HIGH (ref 70–99)
Potassium: 4.1 mmol/L (ref 3.5–5.1)
Sodium: 138 mmol/L (ref 135–145)

## 2015-01-06 LAB — CBC
HCT: 30.9 % — ABNORMAL LOW (ref 39.0–52.0)
Hemoglobin: 9.8 g/dL — ABNORMAL LOW (ref 13.0–17.0)
MCH: 29.5 pg (ref 26.0–34.0)
MCHC: 31.7 g/dL (ref 30.0–36.0)
MCV: 93.1 fL (ref 78.0–100.0)
Platelets: 204 10*3/uL (ref 150–400)
RBC: 3.32 MIL/uL — ABNORMAL LOW (ref 4.22–5.81)
RDW: 15.6 % — ABNORMAL HIGH (ref 11.5–15.5)
WBC: 7 10*3/uL (ref 4.0–10.5)

## 2015-01-06 LAB — PHOSPHORUS: Phosphorus: 3.6 mg/dL (ref 2.3–4.6)

## 2015-01-06 LAB — GLUCOSE, CAPILLARY
Glucose-Capillary: 110 mg/dL — ABNORMAL HIGH (ref 70–99)
Glucose-Capillary: 192 mg/dL — ABNORMAL HIGH (ref 70–99)

## 2015-01-06 MED ORDER — FOLIC ACID 1 MG PO TABS: 1.0000 mg | ORAL_TABLET | Freq: Every day | ORAL | Status: DC

## 2015-01-06 MED ORDER — FUROSEMIDE 40 MG PO TABS
40.0000 mg | ORAL_TABLET | Freq: Every day | ORAL | Status: DC
Start: 2015-01-06 — End: 2015-02-10

## 2015-01-06 MED ORDER — INSULIN GLARGINE 100 UNIT/ML ~~LOC~~ SOLN: 12.0000 [IU] | Freq: Every day | SUBCUTANEOUS | Status: DC

## 2015-01-06 NOTE — Progress Notes (Signed)
Patient discharged to home with wife. D/c instructions given regarding medications, follow up appointments, activity, and diet. Patient was able to "teach back" information. IV d/cd, site clean, dry, and intact.

## 2015-01-06 NOTE — Discharge Summary (Signed)
Physician Discharge Summary  [name] QIO:[id number] DOB: 11-03-1956 DOA: 12/24/2014  PCP: Carlyle Dolly, Wanda Plump, MD  Admit date: 12/24/2014 Discharge date: 01/06/2015  Time spent: 40 minutes  Recommendations for Outpatient Follow-up:  1. Follow up with Dr Hinda Lenis 1 week. BMET at that time 2. Follow up with Dr Merlene Laughter 4 weeks 3. Follow up with sleep study to schedule evaluation 4. Follow up with endocrinologist 5. Follow up with cardiology for evaluation of HF and medications as adjustments made  Discharge Diagnoses:  Principal Problem:   SAH (subarachnoid hemorrhage) Active Problems:   Diabetes mellitus, type II   Essential hypertension   PAF (paroxysmal atrial fibrillation)   Obesity   Acute-on-chronic renal failure   DM type 2, uncontrolled, with renal complications   GERD (gastroesophageal reflux disease)   CAD (coronary artery disease)   OSA on CPAP   Acute renal failure   Dehydration   Chronic systolic congestive heart failure   Acute renal failure syndrome   Abdominal pain, generalized   Metabolic acidosis   Acute encephalopathy   Fall   Encephalopathy   Discharge Condition: stable  Diet recommendation: carb modified heart healthy  Filed Weights   01/04/15 0500 01/05/15 0500 01/06/15 0500  Weight: 93.9 kg (207 lb 0.2 oz) 93.3 kg (205 lb 11 oz) 93.3 kg (205 lb 11 oz)    History of present illness:  [name] is a 59 y.o. male with a past medical history that includes ischemic cardiomyopathy with an EF 15% with AICD in place, diabetes, CAD status post stents, A. fib on eliquis, chronic kidney disease stage III, presented  to the emergency department on 12/24/14 after a fall. Initial evaluation revealed a subarachnoid hemorrhage acute on chronic kidney failure and dehydration.  Patient reported approximately 10 days prior he underwent a routine colonoscopy and EGD for screening purposes. He reported since that time his appetite had waxed and waned. He  reported evening prior to presentation his legs "just gave out". He denied losing consciousness but reported hitting his head. He denied any chest pain palpitation headache visual disturbances. He denied any fever chills recent illness. Denied any abdominal pain nausea vomiting diarrhea.  Workup in the emergency room significant for creatinine of 5.69, serum glucose 206, sodium 132, initial troponin negative, chest x-ray with no acute cardiopulmonary disease, CT of the head revealed subarachnoid hemorrhage.  The emergency department he was afebrile with a slightly soft blood pressure of 99/57. He was provided with 2 250 mL boluses of normal saline. At the time of my exam his blood pressures 112/65 heart rate 73.   Hospital Course:  Acute encephalopathy  resolved at discharge. Felt to be related to decreased clearance of medications in the setting of worsening renal failure. Celexa and Ranexa discontinued, both of which can cause somnolence/agitation. CT yields resolving SAH Evaluated by neurology who opine related to toxic metabolic phenomenon related to renal failure. EEG without seizure.  Acute on chronic kidney failure stage III.Creatinine continues to trend down at discharge. Good urine output. Continue po lasix at discharge. Evaluated by nephrology who opine ATN vs prerenal. Will follow up with Dr Hinda Lenis in 1 weeks.    SAH (subarachnoid hemorrhage): Secondary to mechanical fall in setting of aspirin and Eliquis. CT was done revealing interval near-complete resolution of blood in basilar cisterns. Neurology has been following the patient and feels that it is reasonable to resume Eliquis.  Metabolic acidosis Resolved.    Abdominal pain: related to constipation. resolved  Diabetes mellitus, type II: Hemoglobin A1c 10.6. Blood sugars are stable. Will discharge on  half of his Lantus dose. He has follow up with Dr Dorris Fetch for evaluation.     PAF (paroxysmal atrial fibrillation):  Rate controlled. Anticoagulated with Eliquis. Patient developed a wide complex tachycardia on 1/29. EKG was reviewed with cardiology and it was felt that patient likely had atrial fibrillation with aberrancy vs. Slow VT. His ICD was interrogated by [facility]. Jude rep and discussed with Dr. Rayann Heman, on call for electrophysiology.  Interrogation revealed that patient was in atrial fib/flutter and no ventricular arrythmias were noted. Recommendations were to change toprol $RemoveBefor'50mg'RcVttucKyQFR$  daily to lopressor $RemoveBefo'25mg'cnwmQBMFqSy$  q6h for easier titration. Will resume Toprol XL $RemoveB'100mg'bsKVSkVY$  at discharge.    Obesity: BMI 30.3 t    GERD (gastroesophageal reflux disease): Stable at baseline   CAD (coronary artery disease): Troponin negative.     Chronic Systolic CHF: Cmpensated at discharge.     Procedures:  EEG with generalized encephalopathy no siezure  Consultations:  Nephrology  neurology  Discharge Exam: Filed Vitals:   01/06/15 0800  BP:   Pulse:   Temp: 97.7 F (36.5 C)  Resp:     General: well nourished looking comfortable Cardiovascular: RRR no m/g/r/ trace LE edema Respiratory: normal effort BS clear  Discharge Instructions   Discharge Instructions    Diet - low sodium heart healthy    Complete by:  As directed      Diet - low sodium heart healthy    Complete by:  As directed      Discharge instructions    Complete by:  As directed   Take medications as directed Follow up with sleep study clinic Follow up with Dr Merlene Laughter in 4 weeks     Increase activity slowly    Complete by:  As directed      Increase activity slowly    Complete by:  As directed           Current Discharge Medication List    START taking these medications   Details  folic acid (FOLVITE) 1 MG tablet Take 1 tablet (1 mg total) by mouth daily.    insulin glargine (LANTUS) 100 UNIT/ML injection Inject 0.12 mLs (12 Units total) into the skin at bedtime. Qty: 10 mL, Refills: 11      CONTINUE these medications  which have CHANGED   Details  furosemide (LASIX) 40 MG tablet Take 1 tablet (40 mg total) by mouth daily. Qty: 30 tablet, Refills: 0      CONTINUE these medications which have NOT CHANGED   Details  amiodarone (PACERONE) 200 MG tablet Take 1 tablet (200 mg total) by mouth daily. Qty: 30 tablet, Refills: 0    apixaban (ELIQUIS) 5 MG TABS tablet Take 5 mg by mouth 2 (two) times daily.    co-enzyme Q-10 50 MG capsule Take 50 mg by mouth every morning.     fluticasone (FLONASE) 50 MCG/ACT nasal spray Place 1 spray into both nostrils daily as needed for allergies.     isosorbide dinitrate (ISORDIL) 30 MG tablet Take 15 mg by mouth 2 (two) times daily as needed (shortness of breath).     meclizine (ANTIVERT) 25 MG tablet Take 25 mg by mouth as needed for dizziness.    metoprolol succinate (TOPROL-XL) 100 MG 24 hr tablet Take 100 mg by mouth daily. Take with or immediately following a meal.    niacin (NIASPAN) 1000 MG CR tablet Take 1,000 mg by mouth at  bedtime.      nitroGLYCERIN (NITROLINGUAL) 0.4 MG/SPRAY spray Place 1 spray under the tongue every 5 (five) minutes as needed. angina Qty: 12 g, Refills: 3    Omega-3 Fatty Acids (FISH OIL) 1200 MG CAPS Take 1,200 mg by mouth 2 (two) times daily.      pantoprazole (PROTONIX) 40 MG tablet Take 1 tablet (40 mg total) by mouth daily. Qty: 30 tablet, Refills: 6    rosuvastatin (CRESTOR) 40 MG tablet Take 1 tablet (40 mg total) by mouth at bedtime. Qty: 90 tablet, Refills: 3    spironolactone (ALDACTONE) 25 MG tablet Take 12.5 mg by mouth daily.    insulin aspart (NOVOLOG FLEXPEN) 100 UNIT/ML FlexPen Inject 6 Units into the skin 3 (three) times daily with meals. Qty: 15 mL, Refills: 11    peg 3350 powder (MOVIPREP) 100 G SOLR Take 1 kit (200 g total) by mouth as directed. Qty: 1 kit, Refills: 0      STOP taking these medications     aspirin EC 81 MG tablet      citalopram (CELEXA) 20 MG tablet      digoxin (LANOXIN) 0.125 MG  tablet      enalapril (VASOTEC) 2.5 MG tablet      ranolazine (RANEXA) 1000 MG SR tablet      Insulin Glargine (LANTUS) 100 UNIT/ML Solostar Pen        No Known Allergies Follow-up Information    Follow up with [facility] S, MD In 4 days.   Specialty:  Nephrology   Why:  Basic metabolic panel   Contact information:   [address] [address] [phone]       Follow up with [facility].   Contact information:   [address] [place] [address] [phone]       Follow up with [facility] S, MD In 1 week.   Specialty:  Nephrology   Contact information:   [address] [address] [phone]       Follow up In 1 week.   Why:  basic metabolic panel when he coems to the office       The results of significant diagnostics from this hospitalization (including imaging, microbiology, ancillary and laboratory) are listed below for reference.    Significant Diagnostic Studies: Dg Chest 2 View  12/24/2014   CLINICAL DATA:  Fall  EXAM: CHEST  2 VIEW  COMPARISON:  11/19/2014  FINDINGS: Biventricular ICD/pacer leads are in unchanged position. Unchanged cardiomegaly. Aortic and hilar contours are normal. Previously noted CHF has resolved. No pneumothorax. No evidence of fracture.  IMPRESSION: No active cardiopulmonary disease.   Electronically Signed   By: Jorje Guild M.D.   On: 12/24/2014 05:23   Dg Thoracic Spine 2 View  12/24/2014   CLINICAL DATA:  Fall  EXAM: THORACIC SPINE - 2 VIEW  COMPARISON:  Chest x-ray 05/19/2014  FINDINGS: There is no evidence of thoracic spine fracture or subluxation.  Chronic exaggerated thoracic kyphosis with diffuse spondylotic endplate spurring. No focally notable/advanced disc narrowing or endplate erosion.  IMPRESSION: No acute osseous finding.   Electronically Signed   By: Jorje Guild M.D.   On: 12/24/2014 05:26   Ct Head Wo Contrast  01/01/2015   CLINICAL  DATA:  Subsequent evaluation of subarachnoid hemorrhage, fell on January 19th, patient complaining of headache and altered mental status  EXAM: CT HEAD WITHOUT CONTRAST  TECHNIQUE: Contiguous axial images were obtained from the base of  the skull through the vertex without intravenous contrast.  COMPARISON:  12/28/2014, 12/25/2014  FINDINGS: The study is limited by mild motion artifact. The extra-axial fluid previously seen in the quadrigeminal and ambient cisterns is not identified currently suggesting complete resolution. There is no parenchymal hemorrhage or extra-axial fluid on the current examination. There is no evidence of infarct or mass. There is no hydrocephalus. Stable mild atrophy. The calvarium is intact. Visualized portions of the paranasal sinuses unremarkable. Atherosclerotic vascular calcifications again identified.  IMPRESSION: No acute findings. Interval resolution of previous extra-axial hemorrhage.   Electronically Signed   By: Skipper Cliche M.D.   On: 01/01/2015 16:15   Ct Head Wo Contrast  12/28/2014   CLINICAL DATA:  Recent fall with head injury and subarachnoid hemorrhage. History of diabetes, atrial fibrillation, chronic kidney disease and AICD. History of rectal and prostate cancer. Subsequent encounter.  EXAM: CT HEAD WITHOUT CONTRAST  TECHNIQUE: Contiguous axial images were obtained from the base of the skull through the vertex without intravenous contrast.  COMPARISON:  Head CT 12/25/2014 and 12/24/2014.  FINDINGS: There has been interval near-complete resolution of the previously demonstrated hyperdensity within the quadrigeminal and ambient cisterns. There is no progressive subarachnoid hemorrhage. There is no evidence of acute infarct, hydrocephalus or intraparenchymal hemorrhage. Diffuse vascular calcifications are noted.  The visualized paranasal sinuses, mastoid air cells and middle ears are clear. The calvarium is intact.  IMPRESSION: 1. Interval near-complete resolution of  previously demonstrated blood in the basilar cisterns. 2. No new findings.  No evidence of hydrocephalus.   Electronically Signed   By: Camie Patience M.D.   On: 12/28/2014 18:45   Ct Head Wo Contrast  12/25/2014   CLINICAL DATA:  Subsequent evaluation of subarachnoid hemorrhage, with headache and dizziness  EXAM: CT HEAD WITHOUT CONTRAST  TECHNIQUE: Contiguous axial images were obtained from the base of the skull through the vertex without intravenous contrast.  COMPARISON:  December 24, 1914  FINDINGS: Calvarium is intact.  Visualized sinuses clear.  No evidence of parenchymal hemorrhage. No evidence of mass or vascular territory infarct. No hydrocephalus. Vertebrobasilar calcification again noted. Again identified is hyperattenuation in the quadrigeminal and ambient cisterns. When compared to prior study there is no interval change.  IMPRESSION: No change when compared to prior study with small volume extra-axial fluid again seen in the quadrigeminal and ambient cisterns.   Electronically Signed   By: Skipper Cliche M.D.   On: 12/25/2014 11:40   Ct Head Wo Contrast  12/24/2014   CLINICAL DATA:  Dizziness and weakness for a few days.  EXAM: CT HEAD WITHOUT CONTRAST  TECHNIQUE: Contiguous axial images were obtained from the base of the skull through the vertex without intravenous contrast.  COMPARISON:  CT of the head July 17, 2014  FINDINGS: The ventricles and sulci are normal for age. No intraparenchymal hemorrhage, mass effect nor midline shift. Mild supratentorial white matter hypodensities are within normal range for patient's age and though non-specific suggest sequelae of chronic small vessel ischemic disease. No acute large vascular territory infarcts.  Small amount of dense extra-axial blood products at the level of the quadrigeminal and ambient cistern (axial 11/36). Basal cisterns are patent. Severe calcific atherosclerosis of the carotid siphons and included vertebral arteries.  No skull fracture.  The included ocular globes and orbital contents are non-suspicious. The mastoid aircells and included paranasal sinuses are well-aerated.  IMPRESSION: Small amount of blood products in the quadrigeminal/ambient cistern ; this can be seen with perimesencephalic subarachnoid hemorrhage (  typically not aneurysmal).  Mild white matter changes can be seen with chronic small vessel ischemic disease.  Acute findings discussed with and reconfirmed by Dr.STEPHEN RANCOUR on 12/24/2014 at 5:19 am.   Electronically Signed   By: Elon Alas   On: 12/24/2014 05:20   Dg Chest Port 1 View  12/29/2014   CLINICAL DATA:  Short of breath  EXAM: PORTABLE CHEST - 1 VIEW  COMPARISON:  11/19/2014  FINDINGS: There is a left chest wall ICD with leads in the right atrial appendage, coronary sinus and right ventricle. There is mild cardiac enlargement. No pleural effusion or edema. No airspace consolidation.  IMPRESSION: 1. No active cardiopulmonary abnormalities.   Electronically Signed   By: Kerby Moors M.D.   On: 12/29/2014 10:14   Dg Abd Portable 1v  12/26/2014   CLINICAL DATA:  Abdominal pain.  EXAM: PORTABLE ABDOMEN - 1 VIEW  COMPARISON:  CT 05/20/2014.  FINDINGS: Soft tissue structures are unremarkable. Gas pattern is nonspecific. No free air. Large amount of stool in colon P Ostomy noted over the left abdomen. Vascular calcification. Degenerative changes noted in the lumbar spine and both hips. Pelvic surgical clips.  IMPRESSION: Large amount of stool in colon. Constipation cannot be excluded. No bowel distention.   Electronically Signed   By: Marcello Moores  Register   On: 12/26/2014 09:11    Microbiology: Recent Results (from the past 240 hour(s))  MRSA PCR Screening     Status: None   Collection Time: 01/03/15  7:30 PM  Result Value Ref Range Status   MRSA by PCR NEGATIVE NEGATIVE Final    Comment:        The GeneXpert MRSA Assay (FDA approved for NASAL specimens only), is one component of a comprehensive MRSA  colonization surveillance program. It is not intended to diagnose MRSA infection nor to guide or monitor treatment for MRSA infections.      Labs: Basic Metabolic Panel:  Recent Labs Lab 01/02/15 0708 01/03/15 0551 01/03/15 1837 01/04/15 0625 01/05/15 0614 01/06/15 0449  NA 136 136  --  137 136 138  K 4.3 4.0 3.7 4.0 3.7 4.1  CL 103 100  --  99 102 104  CO2 23 27  --  $R'27 27 27  'nY$ GLUCOSE 89 107*  --  131* 130* 135*  BUN 58* 57*  --  58* 57* 48*  CREATININE 4.98* 4.78*  --  4.50* 4.04* 3.55*  CALCIUM 9.0 8.7  --  8.7 8.6 8.6  MG  --   --  2.2  --   --   --   PHOS  --   --   --   --   --  3.6   Liver Function Tests:  Recent Labs Lab 01/01/15 1356 01/03/15 0551  AST 47* 47*  ALT 23 25  ALKPHOS 69 63  BILITOT 0.7 1.0  PROT 7.2 7.1  ALBUMIN 2.8* 2.7*   No results for input(s): LIPASE, AMYLASE in the last 168 hours.  Recent Labs Lab 01/01/15 1356  AMMONIA 34*   CBC:  Recent Labs Lab 12/31/14 0524 01/02/15 0708 01/03/15 0551 01/06/15 0449  WBC 7.4 7.1 7.1 7.0  HGB 9.9* 9.6* 10.0* 9.8*  HCT 30.2* 29.3* 30.7* 30.9*  MCV 90.4 91.0 91.1 93.1  PLT 175 218 217 204   Cardiac Enzymes:  Recent Labs Lab 01/01/15 1514  CKTOTAL 77  TROPONINI 0.03   BNP: BNP (last 3 results)  Recent Labs  07/18/14 0555 09/27/14 0523 11/19/14 0745  PROBNP 1116.0* 9164.0* 5233.0*   CBG:  Recent Labs Lab 01/05/15 1144 01/05/15 1623 01/05/15 2152 01/06/15 0721 01/06/15 1146  GLUCAP 162* 105* 229* 110* 192*       Signed:  BLACK,KAREN M  [facility] 01/06/2015, 1:54 PM

## 2015-01-06 NOTE — Progress Notes (Signed)
Subjective: Interval History: Patient states he was able to walk a little bit and continue to feel better Objective: Vital signs in last 24 hours: Temp:  [97.5 F (36.4 C)-97.9 F (36.6 C)] 97.7 F (36.5 C) (02/01 0800) Pulse Rate:  [72-107] 73 (02/01 0600) Resp:  [0-20] 7 (02/01 0600) BP: (86-118)/(45-72) 109/67 mmHg (02/01 0600) SpO2:  [94 %-100 %] 99 % (02/01 0600) Weight:  [93.3 kg (205 lb 11 oz)] 93.3 kg (205 lb 11 oz) (02/01 0500) Weight change: 0 kg (0 lb)  Intake/Output from previous day: 01/31 0701 - 02/01 0700 In: 1200 [I.V.:1200] Out: 1975 [Urine:1975] Intake/Output this shift: Total I/O In: 50 [I.V.:50] Out: 500 [Urine:500]  Generally patient is alert and in no apparent distress. Chest is clear to auscultation His heart exam revealed regular rate and rhythm no murmur Abdomen soft positive bowel sounds Extremities no edema  Lab Results:  Recent Labs  01/06/15 0449  WBC 7.0  HGB 9.8*  HCT 30.9*  PLT 204   BMET:   Recent Labs  01/05/15 0614 01/06/15 0449  NA 136 138  K 3.7 4.1  CL 102 104  CO2 27 27  GLUCOSE 130* 135*  BUN 57* 48*  CREATININE 4.04* 3.55*  CALCIUM 8.6 8.6   No results for input([name]): PTH in the last 72 hours. Iron Studies: No results for input([name]): IRON, TIBC, TRANSFERRIN, FERRITIN in the last 72 hours.  Studies/Results: No results found.  I have reviewed the patient'[name] current medications.  Assessment/Plan: Problem #1 acute kidney injury: His BUN and creatinine continued to improve and patient is asympromatic Problem #2 diabetes: He is on insulin and no polyuria or polydipsia. Problem #3 history of hypotension. Patient is blood pressure is low normal but stable. Patient presently on Lopressor. Problem #4 of atrial fibrillation : Patient had tachycardia taught to be from  A. Fib . Presently patient on Lopressor and also amiodarone. His heart rate seems to be better controlled. Problem #5  confusion: Possibly has reurnd to his  base line Problem #6 rectal cancer Problem#7 History of CHF : Patient remained none  oliguric and asypmtomatic Problem #8 metabolic acidosis: That has corrected Problem #9 anemia: His hemoglobin is stable. [id number]#85 Metabolic bone disease his calcium and phosphorus is in range Plan: We'll continue with present management We'll check his basic metabolic panel in the morning.    LOS: 13 days   [name] 01/06/2015,12:25 PM

## 2015-01-06 NOTE — Progress Notes (Signed)
Patient ambulated around nurses station, had to ask him to slow down, no SOB or unsteadiness, Sitting up in recliner at present

## 2015-01-06 NOTE — Progress Notes (Signed)
Discussed the consequences of giving back his CPAP machine 2-3 months ago.  Frequent periods of apnea.

## 2015-01-06 NOTE — Progress Notes (Signed)
UR chart review completed.  

## 2015-01-06 NOTE — Progress Notes (Signed)
Patient ID: [name], male   DOB: 1956/05/13, 58 y.o.   MRN: [id number]   [facility] NEUROLOGY [name] A. Gerilyn Pilgrim, MD     [email or web address]          Robert QUIROS is an 60 y.o. male.   Assessment/Plan: Resolved - Acute encephalopathy with agitation. Undoubtedly, this is a toxic metabolic phenomenon. I acute renal failure is most likely culprit. The uremia however is causing the patient's medications not to be cleared and these medications are building up in the patient's system. Two medications are particularly concerning. They are RANEXA AND CELEXA. RANEXA has been reported to cause renal failure, confusion and tremors all of which the patient has. Celexa is known to cause tremor and confusion especially in the setting of acute renal failure. I believe we should discontinue both of these medications. Continue to avoid psychotropic medications.    Doing well. Nurse and family report sleep  Apnea   OBSTRUCTIVE SLEEP APNEA: We will re-try CPAP - AUTOPAP   Dramatically improved cognition - likely at baseline   GENERAL: He is resting in bed but somewhat agitated and restless.   HEENT: Supple. Atraumatic normocephalic.   ABDOMEN: soft  EXTREMITIES: No edema   BACK: Normal.  SKIN: Normal by inspection.   MENTAL STATUS: Alert sitting up in chair eating. Oriented x 3. Follows commands. Lucid.  CRANIAL NERVES: Pupils are equal, round and reactive to light and accommodation; extra ocular movements are full, there is no significant nystagmus; visual fields are full; upper and lower facial muscles are normal in strength and symmetric, there is no flattening of the nasolabial folds; tongue is midline; uvula is midline; shoulder elevation is normal.  MOTOR: Normal tone, bulk and strength; no pronator drift.  COORDINATION: Left finger to nose is normal, right finger to nose is normal, No rest tremor; no intention tremor; no postural tremor; no bradykinesia. However, the patient has  frequent body jerks and twitches all over. These are not fasciculations but quit jerky movements and fidgets.      Objective: Vital signs in last 24 hours: Temp:  [97.5 F (36.4 C)-98.1 F (36.7 C)] 97.5 F (36.4 C) (02/01 0400) Pulse Rate:  [72-107] 73 (02/01 0600) Resp:  [0-20] 7 (02/01 0600) BP: (86-125)/(45-92) 109/67 mmHg (02/01 0600) SpO2:  [94 %-100 %] 99 % (02/01 0600) Weight:  [93.3 kg (205 lb 11 oz)] 93.3 kg (205 lb 11 oz) (02/01 0500)  Intake/Output from previous day: 01/31 0701 - 02/01 0700 In: 1150 [I.V.:1150] Out: 1975 [Urine:1975] Intake/Output this shift: Total I/O In: -  Out: 500 [Urine:500] Nutritional status: Diet Carb Modified   Lab Results: Results for orders placed or performed during the hospital encounter of 12/24/14 (from the past 48 hour(s))  Glucose, capillary     Status: Abnormal   Collection Time: 01/04/15 11:26 AM  Result Value Ref Range   Glucose-Capillary 140 (H) 70 - 99 mg/dL  Glucose, capillary     Status: None   Collection Time: 01/04/15  4:35 PM  Result Value Ref Range   Glucose-Capillary 99 70 - 99 mg/dL   Comment 1 Documented in Chart    Comment 2 Notify RN   Glucose, capillary     Status: Abnormal   Collection Time: 01/04/15  9:33 PM  Result Value Ref Range   Glucose-Capillary 198 (H) 70 - 99 mg/dL  Basic metabolic panel     Status: Abnormal   Collection Time: 01/05/15  6:14 AM  Result Value Ref Range  Sodium 136 135 - 145 mmol/L   Potassium 3.7 3.5 - 5.1 mmol/L   Chloride 102 96 - 112 mmol/L   CO2 27 19 - 32 mmol/L   Glucose, Bld 130 (H) 70 - 99 mg/dL   BUN 57 (H) 6 - 23 mg/dL   Creatinine, Ser 0.98 (H) 0.50 - 1.35 mg/dL   Calcium 8.6 8.4 - 11.9 mg/dL   GFR calc non Af Amer 15 (L) >90 mL/min   GFR calc Af Amer 17 (L) >90 mL/min    Comment: (NOTE) The eGFR has been calculated using the CKD EPI equation. This calculation has not been validated in all clinical situations. eGFR's persistently <90 mL/min signify possible  Chronic Kidney Disease.    Anion gap 7 5 - 15  Glucose, capillary     Status: Abnormal   Collection Time: 01/05/15  8:18 AM  Result Value Ref Range   Glucose-Capillary 104 (H) 70 - 99 mg/dL   Comment 1 Documented in Chart    Comment 2 Notify RN   Glucose, capillary     Status: Abnormal   Collection Time: 01/05/15 11:44 AM  Result Value Ref Range   Glucose-Capillary 162 (H) 70 - 99 mg/dL  Glucose, capillary     Status: Abnormal   Collection Time: 01/05/15  4:23 PM  Result Value Ref Range   Glucose-Capillary 105 (H) 70 - 99 mg/dL  Glucose, capillary     Status: Abnormal   Collection Time: 01/05/15  9:52 PM  Result Value Ref Range   Glucose-Capillary 229 (H) 70 - 99 mg/dL  Phosphorus     Status: None   Collection Time: 01/06/15  4:49 AM  Result Value Ref Range   Phosphorus 3.6 2.3 - 4.6 mg/dL  Basic metabolic panel     Status: Abnormal   Collection Time: 01/06/15  4:49 AM  Result Value Ref Range   Sodium 138 135 - 145 mmol/L   Potassium 4.1 3.5 - 5.1 mmol/L   Chloride 104 96 - 112 mmol/L   CO2 27 19 - 32 mmol/L   Glucose, Bld 135 (H) 70 - 99 mg/dL   BUN 48 (H) 6 - 23 mg/dL   Creatinine, Ser 1.47 (H) 0.50 - 1.35 mg/dL   Calcium 8.6 8.4 - 82.9 mg/dL   GFR calc non Af Amer 17 (L) >90 mL/min   GFR calc Af Amer 20 (L) >90 mL/min    Comment: (NOTE) The eGFR has been calculated using the CKD EPI equation. This calculation has not been validated in all clinical situations. eGFR's persistently <90 mL/min signify possible Chronic Kidney Disease.    Anion gap 7 5 - 15  CBC     Status: Abnormal   Collection Time: 01/06/15  4:49 AM  Result Value Ref Range   WBC 7.0 4.0 - 10.5 K/uL   RBC 3.32 (L) 4.22 - 5.81 MIL/uL   Hemoglobin 9.8 (L) 13.0 - 17.0 g/dL   HCT 56.2 (L) 13.0 - 86.5 %   MCV 93.1 78.0 - 100.0 fL   MCH 29.5 26.0 - 34.0 pg   MCHC 31.7 30.0 - 36.0 g/dL   RDW 78.4 (H) 69.6 - 29.5 %   Platelets 204 150 - 400 K/uL  Glucose, capillary     Status: Abnormal   Collection  Time: 01/06/15  7:21 AM  Result Value Ref Range   Glucose-Capillary 110 (H) 70 - 99 mg/dL   Comment 1 Notify RN     Lipid Panel No results for input(s):  CHOL, TRIG, HDL, CHOLHDL, VLDL, LDLCALC in the last 72 hours.  Studies/Results:   EEG 1. This recording is markedly abnormal showing severe generalized slowing indicating a severe generalized encephalopathy. There is also a moderate amount of triphasic waves. Triphasic waves are seen in hepatic and renal encephalopathies. No epileptiform activities are observed.  HEAD CT: Brain CT scan is reviewed in person and shows resolution of perimesencephalic small hemorrhage. Nothing acute is seen on repeat scan.  Medications:  Scheduled Meds: . amiodarone  200 mg Oral Daily  . apixaban  5 mg Oral BID  . docusate sodium  100 mg Oral BID  . feeding supplement (GLUCERNA SHAKE)  237 mL Oral BID BM  . folic acid  1 mg Oral Daily  . furosemide  40 mg Oral Daily  . insulin aspart  0-15 Units Subcutaneous TID WC  . insulin aspart  0-5 Units Subcutaneous QHS  . insulin aspart  3 Units Subcutaneous TID WC  . insulin glargine  12 Units Subcutaneous QHS  . metoprolol tartrate  25 mg Oral Q6H  . pantoprazole  40 mg Oral Daily  . rosuvastatin  40 mg Oral QHS  . sodium chloride  3 mL Intravenous Q12H   Continuous Infusions: . sodium chloride 50 mL/hr at 01/06/15 0600   PRN Meds:.sodium chloride, acetaminophen, fluticasone, isosorbide dinitrate, ondansetron (ZOFRAN) IV, sodium chloride     LOS: 13 days   Chisom Muntean A. Gerilyn Pilgrim, M.D.  Diplomate, Biomedical engineer of Psychiatry and Neurology ( Neurology).

## 2015-01-07 ENCOUNTER — Other Ambulatory Visit (HOSPITAL_COMMUNITY): Payer: Self-pay | Admitting: Radiology

## 2015-01-07 DIAGNOSIS — G4730 Sleep apnea, unspecified: Secondary | ICD-10-CM

## 2015-01-08 DIAGNOSIS — I2510 Atherosclerotic heart disease of native coronary artery without angina pectoris: Secondary | ICD-10-CM | POA: Diagnosis not present

## 2015-01-08 DIAGNOSIS — W19XXXD Unspecified fall, subsequent encounter: Secondary | ICD-10-CM | POA: Diagnosis not present

## 2015-01-08 DIAGNOSIS — S066X0D Traumatic subarachnoid hemorrhage without loss of consciousness, subsequent encounter: Secondary | ICD-10-CM | POA: Diagnosis not present

## 2015-01-08 DIAGNOSIS — I5022 Chronic systolic (congestive) heart failure: Secondary | ICD-10-CM | POA: Diagnosis not present

## 2015-01-08 DIAGNOSIS — I480 Paroxysmal atrial fibrillation: Secondary | ICD-10-CM | POA: Diagnosis not present

## 2015-01-08 DIAGNOSIS — N183 Chronic kidney disease, stage 3 (moderate): Secondary | ICD-10-CM | POA: Diagnosis not present

## 2015-01-09 DIAGNOSIS — W19XXXD Unspecified fall, subsequent encounter: Secondary | ICD-10-CM | POA: Diagnosis not present

## 2015-01-09 DIAGNOSIS — I480 Paroxysmal atrial fibrillation: Secondary | ICD-10-CM | POA: Diagnosis not present

## 2015-01-09 DIAGNOSIS — I2510 Atherosclerotic heart disease of native coronary artery without angina pectoris: Secondary | ICD-10-CM | POA: Diagnosis not present

## 2015-01-09 DIAGNOSIS — I5022 Chronic systolic (congestive) heart failure: Secondary | ICD-10-CM | POA: Diagnosis not present

## 2015-01-09 DIAGNOSIS — S066X0D Traumatic subarachnoid hemorrhage without loss of consciousness, subsequent encounter: Secondary | ICD-10-CM | POA: Diagnosis not present

## 2015-01-09 DIAGNOSIS — N183 Chronic kidney disease, stage 3 (moderate): Secondary | ICD-10-CM | POA: Diagnosis not present

## 2015-01-09 LAB — HEAVY METALS, RANDOM URINE: CREATRANDUR: 47.2 mg/dL (ref 20.0–370.0)

## 2015-01-10 ENCOUNTER — Emergency Department (HOSPITAL_COMMUNITY): Payer: Medicare Other

## 2015-01-10 ENCOUNTER — Emergency Department (HOSPITAL_COMMUNITY)
Admission: EM | Admit: 2015-01-10 | Discharge: 2015-01-10 | Disposition: A | Discharge status: Home / Self Care | Payer: Medicare Other | Attending: Emergency Medicine | Admitting: Emergency Medicine

## 2015-01-10 ENCOUNTER — Encounter (HOSPITAL_COMMUNITY): Payer: Self-pay

## 2015-01-10 DIAGNOSIS — E119 Type 2 diabetes mellitus without complications: Secondary | ICD-10-CM | POA: Diagnosis not present

## 2015-01-10 DIAGNOSIS — Z79899 Other long term (current) drug therapy: Secondary | ICD-10-CM | POA: Diagnosis not present

## 2015-01-10 DIAGNOSIS — Z9981 Dependence on supplemental oxygen: Secondary | ICD-10-CM | POA: Diagnosis not present

## 2015-01-10 DIAGNOSIS — R1011 Right upper quadrant pain: Secondary | ICD-10-CM | POA: Diagnosis not present

## 2015-01-10 DIAGNOSIS — Z87448 Personal history of other diseases of urinary system: Secondary | ICD-10-CM | POA: Diagnosis not present

## 2015-01-10 DIAGNOSIS — I509 Heart failure, unspecified: Secondary | ICD-10-CM | POA: Insufficient documentation

## 2015-01-10 DIAGNOSIS — I517 Cardiomegaly: Secondary | ICD-10-CM | POA: Diagnosis not present

## 2015-01-10 DIAGNOSIS — G4700 Insomnia, unspecified: Secondary | ICD-10-CM | POA: Insufficient documentation

## 2015-01-10 DIAGNOSIS — K219 Gastro-esophageal reflux disease without esophagitis: Secondary | ICD-10-CM | POA: Diagnosis not present

## 2015-01-10 DIAGNOSIS — R0789 Other chest pain: Secondary | ICD-10-CM | POA: Diagnosis not present

## 2015-01-10 DIAGNOSIS — E785 Hyperlipidemia, unspecified: Secondary | ICD-10-CM | POA: Insufficient documentation

## 2015-01-10 DIAGNOSIS — I10 Essential (primary) hypertension: Secondary | ICD-10-CM | POA: Diagnosis not present

## 2015-01-10 DIAGNOSIS — K859 Acute pancreatitis, unspecified: Secondary | ICD-10-CM | POA: Insufficient documentation

## 2015-01-10 DIAGNOSIS — K828 Other specified diseases of gallbladder: Secondary | ICD-10-CM | POA: Diagnosis not present

## 2015-01-10 DIAGNOSIS — R079 Chest pain, unspecified: Secondary | ICD-10-CM

## 2015-01-10 DIAGNOSIS — R0682 Tachypnea, not elsewhere classified: Secondary | ICD-10-CM | POA: Diagnosis not present

## 2015-01-10 DIAGNOSIS — Z8673 Personal history of transient ischemic attack (TIA), and cerebral infarction without residual deficits: Secondary | ICD-10-CM | POA: Insufficient documentation

## 2015-01-10 DIAGNOSIS — K838 Other specified diseases of biliary tract: Secondary | ICD-10-CM | POA: Diagnosis not present

## 2015-01-10 DIAGNOSIS — Z950 Presence of cardiac pacemaker: Secondary | ICD-10-CM | POA: Diagnosis not present

## 2015-01-10 LAB — HEPATIC FUNCTION PANEL
ALT: 45 U/L (ref 0–53)
AST: 68 U/L — ABNORMAL HIGH (ref 0–37)
Albumin: 2.7 g/dL — ABNORMAL LOW (ref 3.5–5.2)
Alkaline Phosphatase: 95 U/L (ref 39–117)
BILIRUBIN TOTAL: 0.5 mg/dL (ref 0.3–1.2)
Bilirubin, Direct: 0.3 mg/dL (ref 0.0–0.5)
Indirect Bilirubin: 0.2 mg/dL — ABNORMAL LOW (ref 0.3–0.9)
Total Protein: 7.2 g/dL (ref 6.0–8.3)

## 2015-01-10 LAB — CBC
HCT: 33.2 % — ABNORMAL LOW (ref 39.0–52.0)
HEMOGLOBIN: 10.6 g/dL — AB (ref 13.0–17.0)
MCH: 29.9 pg (ref 26.0–34.0)
MCHC: 31.9 g/dL (ref 30.0–36.0)
MCV: 93.5 fL (ref 78.0–100.0)
Platelets: 171 10*3/uL (ref 150–400)
RBC: 3.55 MIL/uL — ABNORMAL LOW (ref 4.22–5.81)
RDW: 14.9 % (ref 11.5–15.5)
WBC: 6.3 10*3/uL (ref 4.0–10.5)

## 2015-01-10 LAB — TROPONIN I
Troponin I: 0.04 ng/mL — ABNORMAL HIGH (ref <0.031)
Troponin I: 0.04 ng/mL — ABNORMAL HIGH (ref <0.031)
Troponin I: 0.04 ng/mL — ABNORMAL HIGH (ref <0.031)

## 2015-01-10 LAB — BASIC METABOLIC PANEL
Anion gap: 6 (ref 5–15)
BUN: 36 mg/dL — ABNORMAL HIGH (ref 6–23)
CO2: 25 mmol/L (ref 19–32)
CREATININE: 3 mg/dL — AB (ref 0.50–1.35)
Calcium: 8.8 mg/dL (ref 8.4–10.5)
Chloride: 105 mmol/L (ref 96–112)
GFR calc Af Amer: 25 mL/min — ABNORMAL LOW (ref >90)
GFR, EST NON AFRICAN AMERICAN: 21 mL/min — AB (ref >90)
GLUCOSE: 199 mg/dL — AB (ref 70–99)
POTASSIUM: 3.9 mmol/L (ref 3.5–5.1)
Sodium: 136 mmol/L (ref 135–145)

## 2015-01-10 LAB — LIPASE, BLOOD: Lipase: 61 U/L — ABNORMAL HIGH (ref 11–59)

## 2015-01-10 MED ORDER — ONDANSETRON HCL 4 MG/2ML IJ SOLN
4.0000 mg | Freq: Once | INTRAMUSCULAR | Status: AC
  Administered 2015-01-10: 4 mg via INTRAVENOUS
  Filled 2015-01-10: qty 2

## 2015-01-10 MED ORDER — MORPHINE SULFATE 4 MG/ML IJ SOLN
4.0000 mg | Freq: Once | INTRAMUSCULAR | Status: AC
Start: 2015-01-10 — End: 2015-01-10
  Administered 2015-01-10: 4 mg via INTRAVENOUS
  Filled 2015-01-10: qty 1

## 2015-01-10 MED ORDER — FENTANYL CITRATE 0.05 MG/ML IJ SOLN
INTRAMUSCULAR | Status: AC
  Filled 2015-01-10: qty 2

## 2015-01-10 MED ORDER — FENTANYL CITRATE 0.05 MG/ML IJ SOLN
50.0000 ug | Freq: Once | INTRAMUSCULAR | Status: AC
  Administered 2015-01-10: 50 ug via INTRAVENOUS

## 2015-01-10 MED ORDER — NITROGLYCERIN 0.4 MG SL SUBL
0.4000 mg | SUBLINGUAL_TABLET | SUBLINGUAL | Status: DC | PRN
  Administered 2015-01-10: 0.4 mg via SUBLINGUAL
  Filled 2015-01-10: qty 1

## 2015-01-10 MED ORDER — HYDROCODONE-ACETAMINOPHEN 5-325 MG PO TABS: 1.0000 | ORAL_TABLET | Freq: Four times a day (QID) | ORAL | Status: DC | PRN

## 2015-01-10 MED ORDER — MORPHINE SULFATE 4 MG/ML IJ SOLN
4.0000 mg | Freq: Once | INTRAMUSCULAR | Status: AC
  Administered 2015-01-10: 4 mg via INTRAVENOUS
  Filled 2015-01-10: qty 1

## 2015-01-10 NOTE — ED Provider Notes (Signed)
CSN: [id number]     Arrival date & time 01/10/15  0337 History   None    Chief Complaint  Patient presents with  . Chest Pain     (Consider location/radiation/quality/duration/timing/severity/associated sxs/prior Treatment) HPI  Patient reports for dinner they ate fried flounder and shrimp. About 2 AM he was awakened from sleep with severe right upper quadrant pain that radiates into his right chest. He has nausea without vomiting or diarrhea. Patient states he's had MI in the past however this feels different from when he had that. Scribed the pain is stabbing. He states he's been evaluated for gallbladder problem in the past however at that time he was having nausea without abdominal pain. Nothing he does makes the pain feel better and nothing he does makes it feel worse.  Patient was just discharged from the hospital 4 days ago after being admitted for 2 weeks. Patient had a fall and hit his head and had a subarachnoid hemorrhage. He also was dehydrated and was going into renal failure.    Cardiologist Dr. Harl Bowie  Past Medical History  Diagnosis Date  . Essential hypertension, benign   . Type 2 diabetes mellitus   . CAD (coronary artery disease)     a. BMS to LAD 2001 at [facility] b. PTCA/atherectomy ramus and BMS to LAD 2009  . Chronic systolic heart failure     a. 2D ECHO: 03/01/2014 EF 15-20%, severe, diffuse hypsokinesis. Mod LV dilation. Mod LVH, indeterminate diastolic dysfxn (poor study), high vent filling pressures, abnormal septal wall motion and dyssynergy c/w RV pacing, mild MR, mod LA dilation, RV systolic function mod reduced, mild TR, PA pressure 42.   Marland Kitchen GERD (gastroesophageal reflux disease)   . Cardiomyopathy, ischemic     a. BIV ICD St. Jude, LVEF 23%  . Paroxysmal atrial fibrillation     a. on amiodarone, digoxin and Eliquis  . Adenocarcinoma of rectum     a. 2008  . Prostate cancer     a. s/p seed implants with chemo and radiation  . 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 St Jude  . TIA (transient ischemic attack)   . OSA on CPAP   . HLD (hyperlipidemia)   . Orthostatic hypotension   . Dizziness     a. chronic. Admission for this 07/18/2014  . History of falling July 2015    due to dizziness from medications  . CHF (congestive heart failure)   . Renal insufficiency    Past Surgical History  Procedure Laterality Date  . Internal defibrillator and pacemaker  2010    x2 St. Jude device  . Abdominal and perineal resection of rectum with total mesorectal excision  10/04/2007  . Colonoscopy      05/17/2007. IMPRESSION: Semilunar, apple-core neoplasm low in the rectum (palpable on digital rectal exam) beginning at 5 cm and corkscrewing up 5 cm in length. This was a low rectal lesion consistent with colorectal carcinoma. It was biosied multiple times. The upstream colon all the way to the cecum appeared normal. Recommendations: Followup on path. Surgical Consultation   . Colonoscopy  09/14/2011    Dr. Gala Romney: via colostomy, Single pedunculated benign inflammatory polyp. Due for surveillance Oct 2015  . Colostomy    . Colonoscopy N/A 07/02/2014    Procedure: COLONOSCOPY;  Surgeon: Daneil Dolin, MD;  Location: [facility];  Service: Endoscopy;  Laterality: N/A;  7:30 / COLONOSCOPY THRU COLOSTOMY  . Esophagogastroduodenoscopy N/A 07/02/2014  Procedure: ESOPHAGOGASTRODUODENOSCOPY (EGD);  Surgeon: Daneil Dolin, MD;  Location: [facility];  Service: Endoscopy;  Laterality: N/A;  7:30  . Savory dilation N/A 07/02/2014    Procedure: SAVORY DILATION;  Surgeon: Daneil Dolin, MD;  Location: [facility];  Service: Endoscopy;  Laterality: N/A;  7:30  . Maloney dilation N/A 07/02/2014    Procedure: Venia Minks DILATION;  Surgeon: Daneil Dolin, MD;  Location: [facility];  Service: Endoscopy;  Laterality: N/A;  7:30  . Portacath placement  06/2007    "removed ~ 1 yr later"  . Cardiac catheterization  08/2001  . Coronary  angioplasty with stent placement  2001; ~ 2006    "1 + 1"   . Left heart catheterization with coronary angiogram N/A 07/13/2013    Procedure: LEFT HEART CATHETERIZATION WITH CORONARY ANGIOGRAM;  Surgeon: Lorretta Harp, MD;  Location: [facility] CATH LAB;  Service: Cardiovascular;  Laterality: N/A;  . Colonoscopy N/A 12/11/2014    Procedure: COLONOSCOPY VIA COLOSTOMY;  Surgeon: Daneil Dolin, MD;  Location: [facility];  Service: Endoscopy;  Laterality: N/A;  730   . Esophagogastroduodenoscopy N/A 12/11/2014    Procedure: ESOPHAGOGASTRODUODENOSCOPY (EGD);  Surgeon: Daneil Dolin, MD;  Location: [facility];  Service: Endoscopy;  Laterality: N/A;   Family History  Problem Relation Age of Onset  . Colon cancer Mother 73  . Colon cancer Sister 66  . Coronary artery disease Father   . Colon cancer Other     2 cousins, succumbed to illness   History  Substance Use Topics  . Smoking status: Never Smoker   . Smokeless tobacco: Never Used  . Alcohol Use: No     Comment: Former user 45 years ago  lives at home Lives with spouse  Review of Systems  All other systems reviewed and are negative.     Allergies  Review of patient's allergies indicates no known allergies.  Home Medications   Prior to Admission medications   Medication Sig Start Date End Date Taking? Authorizing Provider  amiodarone (PACERONE) 200 MG tablet Take 1 tablet (200 mg total) by mouth daily. 05/23/14  Yes Maryann Mikhail, DO  apixaban (ELIQUIS) 5 MG TABS tablet Take 5 mg by mouth 2 (two) times daily.   Yes Historical Provider, MD  co-enzyme Q-10 50 MG capsule Take 50 mg by mouth every morning.    Yes Historical Provider, MD  fluticasone (FLONASE) 50 MCG/ACT nasal spray Place 1 spray into both nostrils daily as needed for allergies.  11/21/13  Yes Historical Provider, MD  furosemide (LASIX) 40 MG tablet Take 1 tablet (40 mg total) by mouth daily. 01/06/15  Yes Lezlie Octave Black, NP  insulin aspart (NOVOLOG FLEXPEN) 100  UNIT/ML FlexPen Inject 6 Units into the skin 3 (three) times daily with meals. 11/20/14  Yes Lezlie Octave Black, NP  insulin glargine (LANTUS) 100 UNIT/ML injection Inject 0.12 mLs (12 Units total) into the skin at bedtime. 01/06/15  Yes Lezlie Octave Black, NP  isosorbide dinitrate (ISORDIL) 30 MG tablet Take 15 mg by mouth 2 (two) times daily as needed (shortness of breath).  07/31/14  Yes Historical Provider, MD  meclizine (ANTIVERT) 25 MG tablet Take 25 mg by mouth as needed for dizziness.   Yes Historical Provider, MD  metoprolol succinate (TOPROL-XL) 100 MG 24 hr tablet Take 100 mg by mouth daily. Take with or immediately following a meal.   Yes Historical Provider, MD  niacin (NIASPAN) 1000 MG CR tablet Take 1,000  mg by mouth at bedtime.     Yes Historical Provider, MD  nitroGLYCERIN (NITROLINGUAL) 0.4 MG/SPRAY spray Place 1 spray under the tongue every 5 (five) minutes as needed. angina 07/19/14  Yes Arnoldo Lenis, MD  Omega-3 Fatty Acids (FISH OIL) 1200 MG CAPS Take 1,200 mg by mouth 2 (two) times daily.     Yes Historical Provider, MD  pantoprazole (PROTONIX) 40 MG tablet Take 1 tablet (40 mg total) by mouth daily. 07/19/14  Yes Arnoldo Lenis, MD  rosuvastatin (CRESTOR) 40 MG tablet Take 1 tablet (40 mg total) by mouth at bedtime. 09/27/14  Yes Arnoldo Lenis, MD  spironolactone (ALDACTONE) 25 MG tablet Take 12.5 mg by mouth daily.   Yes Historical Provider, MD  folic acid (FOLVITE) 1 MG tablet Take 1 tablet (1 mg total) by mouth daily. 01/06/15   Radene Gunning, NP  peg 3350 powder (MOVIPREP) 100 G SOLR Take 1 kit (200 g total) by mouth as directed. Patient not taking: Reported on 12/24/2014 11/12/14   Daneil Dolin, MD   BP 111/85 mmHg  Pulse 73  Temp(Src) 98.7 F (37.1 C) (Oral)  Resp 18  Ht $R'5\' 10"'Dt$  (1.778 m)  Wt 200 lb (90.719 kg)  BMI 28.70 kg/m2  SpO2 96%  Vital signs normal   Physical Exam  Constitutional: He is oriented to person, place, and time. He appears well-developed and  well-nourished.  Non-toxic appearance. He does not appear ill. No distress.  HENT:  Head: Normocephalic and atraumatic.  Right Ear: External ear normal.  Left Ear: External ear normal.  Nose: Nose normal. No mucosal edema or rhinorrhea.  Mouth/Throat: Oropharynx is clear and moist and mucous membranes are normal. No dental abscesses or uvula swelling.  Eyes: Conjunctivae and EOM are normal. Pupils are equal, round, and reactive to light.  Neck: Normal range of motion and full passive range of motion without pain. Neck supple.  Cardiovascular: Normal rate, regular rhythm and normal heart sounds.  Exam reveals no gallop and no friction rub.   No murmur heard. Pulmonary/Chest: Effort normal and breath sounds normal. No respiratory distress. He has no wheezes. He has no rhonchi. He has no rales. He exhibits no tenderness and no crepitus.  Abdominal: Soft. Normal appearance and bowel sounds are normal. He exhibits no distension. There is no tenderness. There is no rebound and no guarding.    Patient is very tender in his right upper quadrant and this reproduces his worse pain.  Musculoskeletal: Normal range of motion. He exhibits no edema or tenderness.  Moves all extremities well.   Neurological: He is alert and oriented to person, place, and time. He has normal strength. No cranial nerve deficit.  Skin: Skin is warm, dry and intact. No rash noted. No erythema. There is pallor.  Psychiatric: He has a normal mood and affect. His speech is normal and behavior is normal. His mood appears not anxious.  Nursing note and vitals reviewed.   ED Course  Procedures (including critical care time)  Medications  nitroGLYCERIN (NITROSTAT) SL tablet 0.4 mg (0.4 mg Sublingual Not Given 01/10/15 0514)  fentaNYL (SUBLIMAZE) injection 50 mcg ( Intravenous Duplicate 12/11/08 9604)  morphine 4 MG/ML injection 4 mg (4 mg Intravenous Given 01/10/15 0517)  ondansetron (ZOFRAN) injection 4 mg (4 mg Intravenous Given  01/10/15 0814)  morphine 4 MG/ML injection 4 mg (4 mg Intravenous Given 01/10/15 0827)   Review of patient's old chart shows in October patient had ultrasound showing a possible  gallstone, he had a nuclear medicine HIDA scan which showed normal function.   I discussed patient's test results specifically his blood work. We discussed repeating his troponin for possible NSTEMI however he states his pain is different from his cardiac pain. We are also going to do an ultrasound to check him for acute cholecystitis with his right upper quadrant pain.  09:30 pt left with Dr Rogene Houston at change of shift to get second troponin results.    Labs Review Results for orders placed or performed during the hospital encounter of 01/10/15  CBC  Result Value Ref Range   WBC 6.3 4.0 - 10.5 K/uL   RBC 3.55 (L) 4.22 - 5.81 MIL/uL   Hemoglobin 10.6 (L) 13.0 - 17.0 g/dL   HCT 33.2 (L) 39.0 - 52.0 %   MCV 93.5 78.0 - 100.0 fL   MCH 29.9 26.0 - 34.0 pg   MCHC 31.9 30.0 - 36.0 g/dL   RDW 14.9 11.5 - 15.5 %   Platelets 171 150 - 400 K/uL  Basic metabolic panel  Result Value Ref Range   Sodium 136 135 - 145 mmol/L   Potassium 3.9 3.5 - 5.1 mmol/L   Chloride 105 96 - 112 mmol/L   CO2 25 19 - 32 mmol/L   Glucose, Bld 199 (H) 70 - 99 mg/dL   BUN 36 (H) 6 - 23 mg/dL   Creatinine, Ser 3.00 (H) 0.50 - 1.35 mg/dL   Calcium 8.8 8.4 - 10.5 mg/dL   GFR calc non Af Amer 21 (L) >90 mL/min   GFR calc Af Amer 25 (L) >90 mL/min   Anion gap 6 5 - 15  Troponin I (MHP)  Result Value Ref Range   Troponin I 0.04 (H) <0.031 ng/mL  Lipase, blood  Result Value Ref Range   Lipase 61 (H) 11 - 59 U/L  Hepatic function panel  Result Value Ref Range   Total Protein 7.2 6.0 - 8.3 g/dL   Albumin 2.7 (L) 3.5 - 5.2 g/dL   AST 68 (H) 0 - 37 U/L   ALT 45 0 - 53 U/L   Alkaline Phosphatase 95 39 - 117 U/L   Total Bilirubin 0.5 0.3 - 1.2 mg/dL   Bilirubin, Direct 0.3 0.0 - 0.5 mg/dL   Indirect Bilirubin 0.2 (L) 0.3 - 0.9 mg/dL     Laboratory interpretation all normal except improving renal insufficiency, borderline positive troponin, elevated lipase, minor elevation of LFT     Imaging Review US Abdomen Limited  01/10/2015   CLINICAL DATA:  Right upper quadrant pain for 8 hr  EXAM: US ABDOMEN LIMITED - RIGHT UPPER QUADRANT  COMPARISON:  CT scan 05/20/2014  FINDINGS: Gallbladder:  No definite shadowing gallstones are identified. Layering gallbladder sludge. There is thickening of gallbladder wall up to 6 mm. No sonographic Murphy's sign.  Common bile duct:  Diameter: 8 mm in diameter mild prominent in size  Liver:  No focal lesion identified. There is mild heterogeneous echogenicity without intrahepatic biliary ductal dilatation  IMPRESSION: 1. No shadowing gallstones are noted within gallbladder. Gallbladder sludge is noted. Thickening of gallbladder wall up to 6 mm without sonographic Murphy's sign. 2. Mild prominent size CBD measures 8 mm in diameter. 3. No intrahepatic biliary ductal dilatation. No focal hepatic mass.   Electronically Signed   By: Lahoma Crocker M.D.   On: 01/10/2015 08:08   Dg Chest Port 1 View  01/10/2015   CLINICAL DATA:  Chest pain since this morning.  EXAM: PORTABLE  CHEST - 1 VIEW  COMPARISON:  12/29/2014  FINDINGS: Dual lead left-sided pacemaker remains in place. Stable cardiomegaly. Suspect mild hyperinflation. No pulmonary edema. No consolidation, pleural effusion, or pneumothorax. No acute osseous abnormalities are seen. There is degenerative change of both shoulders.  IMPRESSION: No acute pulmonary process.  Cardiomegaly is stable.   Electronically Signed   By: Rubye Oaks M.D.   On: 01/10/2015 05:09       Dg Chest 2 View  12/24/2014   CLINICAL DATA:  Fall  .  IMPRESSION: No active cardiopulmonary disease.   Electronically Signed   By: Tiburcio Pea M.D.   On: 12/24/2014 05:23   Dg Thoracic Spine 2 View  12/24/2014   CLINICAL DATA:  Fall  EXAM: .  IMPRESSION: No acute osseous finding.    Electronically Signed   By: Tiburcio Pea M.D.   On: 12/24/2014 05:26   Ct Head Wo Contrast  01/01/2015   CLINICAL DATA:  Subsequent evaluation of subarachnoid hemorrhage, fell on January 19th, patient complaining of headache and altered mental status  IMPRESSION: No acute findings. Interval resolution of previous extra-axial hemorrhage.   Electronically Signed   By: Esperanza Heir M.D.   On: 01/01/2015 16:15   Ct Head Wo Contrast  12/28/2014   CLINICAL DATA:  Recent fall with head injury and subarachnoid hemorrhage. History of diabetes, atrial fibrillation, chronic kidney disease and AICD. History of rectal and prostate cancer. Subsequent encounter.FINDINGS: There has been interval near-complete resolution of the previously demonstrated hyperdensity within the quadrigeminal and ambient cisterns. There is no progressive subarachnoid hemorrhage. There is no evidence of acute infarct, hydrocephalus or intraparenchymal hemorrhage. Diffuse vascular calcifications are noted.  The visualized paranasal sinuses, mastoid air cells and middle ears are clear. The calvarium is intact.  IMPRESSION: 1. Interval near-complete resolution of previously demonstrated blood in the basilar cisterns. 2. No new findings.  No evidence of hydrocephalus.   Electronically Signed   By: Roxy Horseman M.D.   On: 12/28/2014 18:45   Ct Head Wo Contrast  12/25/2014   CLINICAL DATA:  Subsequent evaluation of subarachnoid hemorrhage, with headache and dizziness   IMPRESSION: No change when compared to prior study with small volume extra-axial fluid again seen in the quadrigeminal and ambient cisterns.   Electronically Signed   By: Esperanza Heir M.D.   On: 12/25/2014 11:40   Ct Head Wo Contrast  12/24/2014   CLINICAL DATA:  Dizziness and weakness for a few days.   IMPRESSION: Small amount of blood products in the quadrigeminal/ambient cistern ; this can be seen with perimesencephalic subarachnoid hemorrhage (typically not aneurysmal).   Mild white matter changes can be seen with chronic small vessel ischemic disease.  Acute findings discussed with and reconfirmed by Dr.STEPHEN RANCOUR on 12/24/2014 at 5:19 am.   Electronically Signed   By: Awilda Metro   On: 12/24/2014 05:20   Dg Chest Port 1 View  01/10/2015   CLINICAL DATA:  Chest pain since this morning. s.  IMPRESSION: No acute pulmonary process.  Cardiomegaly is stable.   Electronically Signed   By: Rubye Oaks M.D.   On: 01/10/2015 05:09   Dg Chest Port 1 View  12/29/2014   CLINICAL DATA:  Short of breath  .  IMPRESSION: 1. No active cardiopulmonary abnormalities.   Electronically Signed   By: Signa Kell M.D.   On: 12/29/2014 10:14   Dg Abd Portable 1v  12/26/2014   CLINICAL DATA:  Abdominal pain.  IMPRESSION: Large  amount of stool in colon. Constipation cannot be excluded. No bowel distention.   Electronically Signed   By: Marcello Moores  Register   On: 12/26/2014 09:11    EKG Interpretation   Date/Time:  Friday January 10 2015 03:47:18 EST Ventricular Rate:  94 PR Interval:    QRS Duration: 183 QT Interval:  451 QTC Calculation: 564 R Axis:   -99 Text Interpretation:  Atrial flutter Right bundle branch block Left axis  deviation No significant change since EKG 06 Aug 2001 last ekg 05 Jan 2015  had paced rhythm Confirmed by Vitalia Stough  MD-I, Tehani Mersman ([id number]) on 01/10/2015 4:13:07  AM    #2 EKG   Date: 01/10/2015  Rate: 94  Rhythm: atrial flutter  QRS Axis: left  Intervals: normal  ST/T Wave abnormalities: nonspecific ST/T changes  Conduction Disutrbances:right bundle branch block  Narrative Interpretation:   Old EKG Reviewed: unchanged from EKG earlier tonight    MDM   Final diagnoses:  RUQ abdominal pain  Right-sided chest pain  Acute pancreatitis, unspecified pancreatitis type     Disposition pending   Rolland Porter, MD, Abram Sander    Janice Norrie, MD 01/10/15 [phone]

## 2015-01-10 NOTE — ED Notes (Signed)
Patient with no complaints at this time. Respirations even and unlabored. Skin warm/dry. Discharge instructions reviewed with patient at this time. Patient given opportunity to voice concerns/ask questions. IV removed per policy and band-aid applied to site. Patient discharged at this time and left Emergency Department with steady gait.  

## 2015-01-10 NOTE — ED Notes (Signed)
Pt is on heart monitor, Pt is having 7-9 beat runs of V-Tach. Pt goes back into NSR after episodes. MD made aware.

## 2015-01-10 NOTE — ED Notes (Signed)
Pt has had 2 MI's, with stents placed x1. On this visit, pain is radiating across his chest, beginning under his right rib cage and traveling to his epigastric area. States he had a meal of fried fish last evening. He states his gal bladder has never given him trouble but has been told he has "1" stone that is "small".  Second EKG done and given to EDP,

## 2015-01-10 NOTE — Discharge Instructions (Signed)
As you know extensive workup in the emergency department without significant findings. No elevation in your cardiac markers. We rule out any arrhythmias on your heart rhythms. Gallbladder does show sludge could have a dysfunctional gallbladder. Follow-up with your regular doctor. Also make an appointment to follow-up with your cardiologist. Return for any new or worse symptoms. Take hydrocodone as needed for any right upper quadrant abdominal pain.

## 2015-01-10 NOTE — ED Notes (Signed)
Zackowski MD at bedside. 

## 2015-01-10 NOTE — ED Notes (Signed)
Right side chest pain, upper abd pain x 1 1/2 hours ago.  Pt took 4 baby aspirin at home, ems gave 1 ntg sl en route without relief.

## 2015-01-10 NOTE — ED Notes (Signed)
Patient connected to AICD interogator for transmission.

## 2015-01-10 NOTE — ED Provider Notes (Addendum)
Results for orders placed or performed during the hospital encounter of 01/10/15  CBC  Result Value Ref Range   WBC 6.3 4.0 - 10.5 K/uL   RBC 3.55 (L) 4.22 - 5.81 MIL/uL   Hemoglobin 10.6 (L) 13.0 - 17.0 g/dL   HCT 33.2 (L) 39.0 - 52.0 %   MCV 93.5 78.0 - 100.0 fL   MCH 29.9 26.0 - 34.0 pg   MCHC 31.9 30.0 - 36.0 g/dL   RDW 14.9 11.5 - 15.5 %   Platelets 171 150 - 400 K/uL  Basic metabolic panel  Result Value Ref Range   Sodium 136 135 - 145 mmol/L   Potassium 3.9 3.5 - 5.1 mmol/L   Chloride 105 96 - 112 mmol/L   CO2 25 19 - 32 mmol/L   Glucose, Bld 199 (H) 70 - 99 mg/dL   BUN 36 (H) 6 - 23 mg/dL   Creatinine, Ser 3.00 (H) 0.50 - 1.35 mg/dL   Calcium 8.8 8.4 - 10.5 mg/dL   GFR calc non Af Amer 21 (L) >90 mL/min   GFR calc Af Amer 25 (L) >90 mL/min   Anion gap 6 5 - 15  Troponin I ([facility]HP)  Result Value Ref Range   Troponin I 0.04 (H) <0.031 ng/mL  Lipase, blood  Result Value Ref Range   Lipase 61 (H) 11 - 59 U/L  Hepatic function panel  Result Value Ref Range   Total Protein 7.2 6.0 - 8.3 g/dL   Albumin 2.7 (L) 3.5 - 5.2 g/dL   AST 68 (H) 0 - 37 U/L   ALT 45 0 - 53 U/L   Alkaline Phosphatase 95 39 - 117 U/L   Total Bilirubin 0.5 0.3 - 1.2 mg/dL   Bilirubin, Direct 0.3 0.0 - 0.5 mg/dL   Indirect Bilirubin 0.2 (L) 0.3 - 0.9 mg/dL  Troponin I  Result Value Ref Range   Troponin I 0.04 (H) <0.031 ng/mL   Dg Chest 2 View  12/24/2014   CLINICAL DATA:  Fall  EXAM: CHEST  2 VIEW  COMPARISON:  11/19/2014  FINDINGS: Biventricular ICD/pacer leads are in unchanged position. Unchanged cardiomegaly. Aortic and hilar contours are normal. Previously noted CHF has resolved. No pneumothorax. No evidence of fracture.  IMPRESSION: No active cardiopulmonary disease.   Electronically Signed   By: Jorje Guild M.D.   On: 12/24/2014 05:23   Dg Thoracic Spine 2 View  12/24/2014   CLINICAL DATA:  Fall  EXAM: THORACIC SPINE - 2 VIEW  COMPARISON:  Chest x-ray 05/19/2014  FINDINGS: There is no  evidence of thoracic spine fracture or subluxation.  Chronic exaggerated thoracic kyphosis with diffuse spondylotic endplate spurring. No focally notable/advanced disc narrowing or endplate erosion.  IMPRESSION: No acute osseous finding.   Electronically Signed   By: Jorje Guild M.D.   On: 12/24/2014 05:26   Ct Head Wo Contrast  01/01/2015   CLINICAL DATA:  Subsequent evaluation of subarachnoid hemorrhage, fell on January 19th, patient complaining of headache and altered mental status  EXAM: CT HEAD WITHOUT CONTRAST  TECHNIQUE: Contiguous axial images were obtained from the base of the skull through the vertex without intravenous contrast.  COMPARISON:  12/28/2014, 12/25/2014  FINDINGS: The study is limited by mild motion artifact. The extra-axial fluid previously seen in the quadrigeminal and ambient cisterns is not identified currently suggesting complete resolution. There is no parenchymal hemorrhage or extra-axial fluid on the current examination. There is no evidence of infarct or mass. There is no hydrocephalus. Stable  mild atrophy. The calvarium is intact. Visualized portions of the paranasal sinuses unremarkable. Atherosclerotic vascular calcifications again identified.  IMPRESSION: No acute findings. Interval resolution of previous extra-axial hemorrhage.   Electronically Signed   By: Skipper Cliche M.D.   On: 01/01/2015 16:15   Ct Head Wo Contrast  12/28/2014   CLINICAL DATA:  Recent fall with head injury and subarachnoid hemorrhage. History of diabetes, atrial fibrillation, chronic kidney disease and AICD. History of rectal and prostate cancer. Subsequent encounter.  EXAM: CT HEAD WITHOUT CONTRAST  TECHNIQUE: Contiguous axial images were obtained from the base of the skull through the vertex without intravenous contrast.  COMPARISON:  Head CT 12/25/2014 and 12/24/2014.  FINDINGS: There has been interval near-complete resolution of the previously demonstrated hyperdensity within the  quadrigeminal and ambient cisterns. There is no progressive subarachnoid hemorrhage. There is no evidence of acute infarct, hydrocephalus or intraparenchymal hemorrhage. Diffuse vascular calcifications are noted.  The visualized paranasal sinuses, mastoid air cells and middle ears are clear. The calvarium is intact.  IMPRESSION: 1. Interval near-complete resolution of previously demonstrated blood in the basilar cisterns. 2. No new findings.  No evidence of hydrocephalus.   Electronically Signed   By: Camie Patience M.D.   On: 12/28/2014 18:45   Ct Head Wo Contrast  12/25/2014   CLINICAL DATA:  Subsequent evaluation of subarachnoid hemorrhage, with headache and dizziness  EXAM: CT HEAD WITHOUT CONTRAST  TECHNIQUE: Contiguous axial images were obtained from the base of the skull through the vertex without intravenous contrast.  COMPARISON:  December 24, 1914  FINDINGS: Calvarium is intact.  Visualized sinuses clear.  No evidence of parenchymal hemorrhage. No evidence of mass or vascular territory infarct. No hydrocephalus. Vertebrobasilar calcification again noted. Again identified is hyperattenuation in the quadrigeminal and ambient cisterns. When compared to prior study there is no interval change.  IMPRESSION: No change when compared to prior study with small volume extra-axial fluid again seen in the quadrigeminal and ambient cisterns.   Electronically Signed   By: Skipper Cliche M.D.   On: 12/25/2014 11:40   Ct Head Wo Contrast  12/24/2014   CLINICAL DATA:  Dizziness and weakness for a few days.  EXAM: CT HEAD WITHOUT CONTRAST  TECHNIQUE: Contiguous axial images were obtained from the base of the skull through the vertex without intravenous contrast.  COMPARISON:  CT of the head July 17, 2014  FINDINGS: The ventricles and sulci are normal for age. No intraparenchymal hemorrhage, mass effect nor midline shift. Mild supratentorial white matter hypodensities are within normal range for patient's age and  though non-specific suggest sequelae of chronic small vessel ischemic disease. No acute large vascular territory infarcts.  Small amount of dense extra-axial blood products at the level of the quadrigeminal and ambient cistern (axial 11/36). Basal cisterns are patent. Severe calcific atherosclerosis of the carotid siphons and included vertebral arteries.  No skull fracture. The included ocular globes and orbital contents are non-suspicious. The mastoid aircells and included paranasal sinuses are well-aerated.  IMPRESSION: Small amount of blood products in the quadrigeminal/ambient cistern ; this can be seen with perimesencephalic subarachnoid hemorrhage (typically not aneurysmal).  Mild white matter changes can be seen with chronic small vessel ischemic disease.  Acute findings discussed with and reconfirmed by Dr.STEPHEN RANCOUR on 12/24/2014 at 5:19 am.   Electronically Signed   By: Elon Alas   On: 12/24/2014 05:20   US Abdomen Limited  01/10/2015   CLINICAL DATA:  Right upper quadrant pain for 8 hr  EXAM: US ABDOMEN LIMITED - RIGHT UPPER QUADRANT  COMPARISON:  CT scan 05/20/2014  FINDINGS: Gallbladder:  No definite shadowing gallstones are identified. Layering gallbladder sludge. There is thickening of gallbladder wall up to 6 mm. No sonographic Murphy's sign.  Common bile duct:  Diameter: 8 mm in diameter mild prominent in size  Liver:  No focal lesion identified. There is mild heterogeneous echogenicity without intrahepatic biliary ductal dilatation  IMPRESSION: 1. No shadowing gallstones are noted within gallbladder. Gallbladder sludge is noted. Thickening of gallbladder wall up to 6 mm without sonographic Murphy's sign. 2. Mild prominent size CBD measures 8 mm in diameter. 3. No intrahepatic biliary ductal dilatation. No focal hepatic mass.   Electronically Signed   By: Lahoma Crocker M.D.   On: 01/10/2015 08:08   Dg Chest Port 1 View  01/10/2015   CLINICAL DATA:  Chest pain since this morning.  EXAM:  PORTABLE CHEST - 1 VIEW  COMPARISON:  12/29/2014  FINDINGS: Dual lead left-sided pacemaker remains in place. Stable cardiomegaly. Suspect mild hyperinflation. No pulmonary edema. No consolidation, pleural effusion, or pneumothorax. No acute osseous abnormalities are seen. There is degenerative change of both shoulders.  IMPRESSION: No acute pulmonary process.  Cardiomegaly is stable.   Electronically Signed   By: Jeb Levering M.D.   On: 01/10/2015 05:09   Dg Chest Port 1 View  12/29/2014   CLINICAL DATA:  Short of breath  EXAM: PORTABLE CHEST - 1 VIEW  COMPARISON:  11/19/2014  FINDINGS: There is a left chest wall ICD with leads in the right atrial appendage, coronary sinus and right ventricle. There is mild cardiac enlargement. No pleural effusion or edema. No airspace consolidation.  IMPRESSION: 1. No active cardiopulmonary abnormalities.   Electronically Signed   By: Kerby Moors M.D.   On: 12/29/2014 10:14   Dg Abd Portable 1v  12/26/2014   CLINICAL DATA:  Abdominal pain.  EXAM: PORTABLE ABDOMEN - 1 VIEW  COMPARISON:  CT 05/20/2014.  FINDINGS: Soft tissue structures are unremarkable. Gas pattern is nonspecific. No free air. Large amount of stool in colon P Ostomy noted over the left abdomen. Vascular calcification. Degenerative changes noted in the lumbar spine and both hips. Pelvic surgical clips.  IMPRESSION: Large amount of stool in colon. Constipation cannot be excluded. No bowel distention.   Electronically Signed   By: Marcello Moores  Register   On: 12/26/2014 09:11    Patient turned over to me. Patient presented with onset of right upper quadrant abdominal pain at 2 in the morning. Did not feel like pas  a colonic MIs. Patient with a workup including ultrasound of the abdomen which showed gallbladder sludge no Murphy sign no gallstones. Some thickening of the gallbladder wall. Liver function tests without significant abnormalities. Patient however on reported to me has been going into periods of V.  tach throughout the night. Patient does have a pacemaker defibrillator. It has not fired. With the periods of what appear to be V. tach been quite prolonged. Would've expected for the patient to be shocked.  We'll discuss with patient's cardiologist Dr. branch will probably need to interrogate the pacemaker defibrillator. In addition patients got some mild elevation of troponins 0.042 however with the runs of V. tach this may be the explanation for that. Patient's right upper quadrant pain is not resolved but is improved.  Discussed with cardiology. We will ago ahead and interrogate will get the [employer] people in to interrogate. They did state though that the way it's  acting that it's possible it may look like V. tach on the monitor but may not be. Could be an apparent type beat. But we'll see what we get an interrogation. Did get reported this had been going on throughout the night from the nurses however at turnover that was not relayed to me. Also was not included in my turnover note from the nighttime provider.  Patient has tolerated these runs of whatever they are appear to be V. tach without any problems. Also defibrillator did not fire.  Fredia Sorrow, MD 01/10/15 1242  Patient saw pacemaker defibrillator was interrogated. Report shows no episodes of V. tach V. fib and no shocks. Discussed with cardiology. We'll were seeing on the monitor must represent of an aberrancy for right bundle branch block pattern. We tried to capture a 12-lead EKG,  with the other leads  did not seem to be consistent with V. tach. Based on the interrogation there is no V. tach ongoing. Patient also remains asymptomatic when these happen. If third troponin is not significantly elevated patient can be discharged home as per cardiology. With follow-up as an outpatient.  Fredia Sorrow, MD 01/10/15 [place], MD 01/10/15 1529  Patient's third troponin now after being present in the emergency  department for almost 12 hours is still only elevated at 0.04 no significant evidence of cardiac event. Patient will be discharged home with follow-up with his doctor.  Fredia Sorrow, MD 01/10/15 1540

## 2015-01-10 NOTE — ED Notes (Signed)
Notified [employer] of continued sustained runs of [place].  They are paging our rep, Windle Guard, to come interrogate patient's AICD.

## 2015-01-10 NOTE — ED Notes (Signed)
Patient has had frequent runs of V-tach which convert back to paced rhythm.  Patient remains asymptomatic w/stable VS otherwise. MD is aware of this.

## 2015-01-10 NOTE — ED Notes (Signed)
During episodes of V-Tach patient is alert and oriented. Pt denies chest pain. Per pt, "this is my normal, I had many episodes of this last week when I was admitted upstairs."

## 2015-01-11 DIAGNOSIS — S066X0D Traumatic subarachnoid hemorrhage without loss of consciousness, subsequent encounter: Secondary | ICD-10-CM | POA: Diagnosis not present

## 2015-01-11 DIAGNOSIS — I5022 Chronic systolic (congestive) heart failure: Secondary | ICD-10-CM | POA: Diagnosis not present

## 2015-01-11 DIAGNOSIS — W19XXXD Unspecified fall, subsequent encounter: Secondary | ICD-10-CM | POA: Diagnosis not present

## 2015-01-11 DIAGNOSIS — N183 Chronic kidney disease, stage 3 (moderate): Secondary | ICD-10-CM | POA: Diagnosis not present

## 2015-01-11 DIAGNOSIS — I480 Paroxysmal atrial fibrillation: Secondary | ICD-10-CM | POA: Diagnosis not present

## 2015-01-11 DIAGNOSIS — I2510 Atherosclerotic heart disease of native coronary artery without angina pectoris: Secondary | ICD-10-CM | POA: Diagnosis not present

## 2015-01-13 DIAGNOSIS — I480 Paroxysmal atrial fibrillation: Secondary | ICD-10-CM | POA: Diagnosis not present

## 2015-01-13 DIAGNOSIS — N183 Chronic kidney disease, stage 3 (moderate): Secondary | ICD-10-CM | POA: Diagnosis not present

## 2015-01-13 DIAGNOSIS — I2510 Atherosclerotic heart disease of native coronary artery without angina pectoris: Secondary | ICD-10-CM | POA: Diagnosis not present

## 2015-01-13 DIAGNOSIS — W19XXXD Unspecified fall, subsequent encounter: Secondary | ICD-10-CM | POA: Diagnosis not present

## 2015-01-13 DIAGNOSIS — S066X0D Traumatic subarachnoid hemorrhage without loss of consciousness, subsequent encounter: Secondary | ICD-10-CM | POA: Diagnosis not present

## 2015-01-13 DIAGNOSIS — I5022 Chronic systolic (congestive) heart failure: Secondary | ICD-10-CM | POA: Diagnosis not present

## 2015-01-14 ENCOUNTER — Ambulatory Visit: Payer: Medicare Other | Attending: Internal Medicine | Admitting: Sleep Medicine

## 2015-01-14 ENCOUNTER — Encounter: Payer: Self-pay | Admitting: Internal Medicine

## 2015-01-14 DIAGNOSIS — G4731 Primary central sleep apnea: Secondary | ICD-10-CM | POA: Diagnosis not present

## 2015-01-14 DIAGNOSIS — G4730 Sleep apnea, unspecified: Secondary | ICD-10-CM | POA: Diagnosis not present

## 2015-01-14 DIAGNOSIS — R000 Tachycardia, unspecified: Secondary | ICD-10-CM | POA: Diagnosis not present

## 2015-01-14 DIAGNOSIS — I2510 Atherosclerotic heart disease of native coronary artery without angina pectoris: Secondary | ICD-10-CM | POA: Diagnosis not present

## 2015-01-14 DIAGNOSIS — I5022 Chronic systolic (congestive) heart failure: Secondary | ICD-10-CM | POA: Diagnosis not present

## 2015-01-14 DIAGNOSIS — N183 Chronic kidney disease, stage 3 (moderate): Secondary | ICD-10-CM | POA: Diagnosis not present

## 2015-01-14 DIAGNOSIS — W19XXXD Unspecified fall, subsequent encounter: Secondary | ICD-10-CM | POA: Diagnosis not present

## 2015-01-14 DIAGNOSIS — S066X0D Traumatic subarachnoid hemorrhage without loss of consciousness, subsequent encounter: Secondary | ICD-10-CM | POA: Diagnosis not present

## 2015-01-14 DIAGNOSIS — I480 Paroxysmal atrial fibrillation: Secondary | ICD-10-CM | POA: Diagnosis not present

## 2015-01-15 ENCOUNTER — Encounter: Payer: Self-pay | Admitting: Internal Medicine

## 2015-01-15 DIAGNOSIS — I480 Paroxysmal atrial fibrillation: Secondary | ICD-10-CM | POA: Diagnosis not present

## 2015-01-15 DIAGNOSIS — S066X0D Traumatic subarachnoid hemorrhage without loss of consciousness, subsequent encounter: Secondary | ICD-10-CM | POA: Diagnosis not present

## 2015-01-15 DIAGNOSIS — I2510 Atherosclerotic heart disease of native coronary artery without angina pectoris: Secondary | ICD-10-CM | POA: Diagnosis not present

## 2015-01-15 DIAGNOSIS — I5022 Chronic systolic (congestive) heart failure: Secondary | ICD-10-CM | POA: Diagnosis not present

## 2015-01-15 DIAGNOSIS — W19XXXD Unspecified fall, subsequent encounter: Secondary | ICD-10-CM | POA: Diagnosis not present

## 2015-01-15 DIAGNOSIS — N183 Chronic kidney disease, stage 3 (moderate): Secondary | ICD-10-CM | POA: Diagnosis not present

## 2015-01-17 ENCOUNTER — Other Ambulatory Visit: Payer: Self-pay

## 2015-01-17 ENCOUNTER — Encounter: Payer: Self-pay | Admitting: Gastroenterology

## 2015-01-17 ENCOUNTER — Ambulatory Visit (INDEPENDENT_AMBULATORY_CARE_PROVIDER_SITE_OTHER): Payer: Medicare Other | Admitting: Gastroenterology

## 2015-01-17 VITALS — BP 103/66 | HR 90 | Temp 97.8°F | Ht 70.0 in | Wt 201.8 lb

## 2015-01-17 DIAGNOSIS — I2510 Atherosclerotic heart disease of native coronary artery without angina pectoris: Secondary | ICD-10-CM | POA: Diagnosis not present

## 2015-01-17 DIAGNOSIS — W19XXXD Unspecified fall, subsequent encounter: Secondary | ICD-10-CM | POA: Diagnosis not present

## 2015-01-17 DIAGNOSIS — S066X0D Traumatic subarachnoid hemorrhage without loss of consciousness, subsequent encounter: Secondary | ICD-10-CM | POA: Diagnosis not present

## 2015-01-17 DIAGNOSIS — I480 Paroxysmal atrial fibrillation: Secondary | ICD-10-CM | POA: Diagnosis not present

## 2015-01-17 DIAGNOSIS — I10 Essential (primary) hypertension: Secondary | ICD-10-CM | POA: Diagnosis not present

## 2015-01-17 DIAGNOSIS — N183 Chronic kidney disease, stage 3 (moderate): Secondary | ICD-10-CM | POA: Diagnosis not present

## 2015-01-17 DIAGNOSIS — R809 Proteinuria, unspecified: Secondary | ICD-10-CM | POA: Diagnosis not present

## 2015-01-17 DIAGNOSIS — I5022 Chronic systolic (congestive) heart failure: Secondary | ICD-10-CM | POA: Diagnosis not present

## 2015-01-17 DIAGNOSIS — R1011 Right upper quadrant pain: Secondary | ICD-10-CM

## 2015-01-17 DIAGNOSIS — K5900 Constipation, unspecified: Secondary | ICD-10-CM | POA: Insufficient documentation

## 2015-01-17 DIAGNOSIS — C20 Malignant neoplasm of rectum: Secondary | ICD-10-CM

## 2015-01-17 MED ORDER — HYDROCODONE-ACETAMINOPHEN 5-325 MG PO TABS: 1.0000 | ORAL_TABLET | Freq: Four times a day (QID) | ORAL | Status: DC | PRN

## 2015-01-17 NOTE — Patient Instructions (Addendum)
Start taking 1 capful of Miralax daily each evening as needed for constipation. May increase to twice a day if needed.   Please have blood work done today.   We have referred you to Dr. Dalbert Batman to discuss removing your gallbladder.   Contact us if you have any fever,chills, worsening of pain, nausea, vomiting.

## 2015-01-17 NOTE — Progress Notes (Signed)
Referring Provider: Arnoldo Lenis, MD Primary Care Physician:  Arnoldo Lenis, MD  Primary GI: Dr. Gala Romney   Chief Complaint  Patient presents with  . Abdominal Pain    HPI:   [name] is a 59 y.o. male presenting today with a history of stage III rectal carcinoma, diagnosed in 2008, s/p APR/chemotherapy radiation therapy. Recent colonoscopy Jan 2016 normal, due for surveillance 2021. EGD performed as well due to evidence of esophageal thickening on recent CT; this was also normal.   States dealing with constipation. "colostomy not worked right since colonoscopy". Chronic constipation. Wants to be referred to Dr. Dalbert Batman for consideration of elective cholecystectomy. Has intermittent RUQ pain. Worsening in frequency. Worsened by eating. Continues belching. Last week has been "bad".   US abdomen Oct 2015 with likely gallstone. No biliary dilatation. Repeat US ordered by ED with CBD mildly dilated at 8 mm. HIDA normal with 57% EF.   Past Medical History  Diagnosis Date  . Essential hypertension, benign   . Type 2 diabetes mellitus   . CAD (coronary artery disease)     a. BMS to LAD 2001 at [facility] b. PTCA/atherectomy ramus and BMS to LAD 2009  . Chronic systolic heart failure     a. 2D ECHO: 03/01/2014 EF 15-20%, severe, diffuse hypsokinesis. Mod LV dilation. Mod LVH, indeterminate diastolic dysfxn (poor study), high vent filling pressures, abnormal septal wall motion and dyssynergy c/w RV pacing, mild MR, mod LA dilation, RV systolic function mod reduced, mild TR, PA pressure 42.   Marland Kitchen GERD (gastroesophageal reflux disease)   . Cardiomyopathy, ischemic     a. BIV ICD [employer], LVEF 23%  . Paroxysmal atrial fibrillation     a. on amiodarone, digoxin and Eliquis  . Adenocarcinoma of rectum     a. 2008  . Prostate cancer     a. s/p seed implants with chemo and radiation  . 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 [employer]  . TIA (transient  ischemic attack)   . OSA on CPAP   . HLD (hyperlipidemia)   . Orthostatic hypotension   . Dizziness     a. chronic. Admission for this 07/18/2014  . History of falling July 2015    due to dizziness from medications  . CHF (congestive heart failure)   . Renal insufficiency     Past Surgical History  Procedure Laterality Date  . Internal defibrillator and pacemaker  2010    x2 [employer] device  . Abdominal and perineal resection of rectum with total mesorectal excision  10/04/2007  . Colonoscopy      05/17/2007. IMPRESSION: Semilunar, apple-core neoplasm low in the rectum (palpable on digital rectal exam) beginning at 5 cm and corkscrewing up 5 cm in length. This was a low rectal lesion consistent with colorectal carcinoma. It was biosied multiple times. The upstream colon all the way to the cecum appeared normal. Recommendations: Followup on path. Surgical Consultation   . Colonoscopy  09/14/2011    Dr. Gala Romney: via colostomy, Single pedunculated benign inflammatory polyp. Due for surveillance Oct 2015  . Colostomy    . Colonoscopy N/A 07/02/2014    Procedure: COLONOSCOPY;  Surgeon: Daneil Dolin, MD;  Location: [facility];  Service: Endoscopy;  Laterality: N/A;  7:30 / COLONOSCOPY THRU COLOSTOMY  . Esophagogastroduodenoscopy N/A 07/02/2014    Procedure: ESOPHAGOGASTRODUODENOSCOPY (EGD);  Surgeon: Daneil Dolin, MD;  Location: [facility];  Service:  Endoscopy;  Laterality: N/A;  7:30  . Savory dilation N/A 07/02/2014    Procedure: SAVORY DILATION;  Surgeon: Daneil Dolin, MD;  Location: [facility];  Service: Endoscopy;  Laterality: N/A;  7:30  . Maloney dilation N/A 07/02/2014    Procedure: Venia Minks DILATION;  Surgeon: Daneil Dolin, MD;  Location: [facility];  Service: Endoscopy;  Laterality: N/A;  7:30  . Portacath placement  06/2007    "removed ~ 1 yr later"  . Cardiac catheterization  08/2001  . Coronary angioplasty with stent placement  2001; ~ 2006    "1 + 1"   . Left heart  catheterization with coronary angiogram N/A 07/13/2013    Procedure: LEFT HEART CATHETERIZATION WITH CORONARY ANGIOGRAM;  Surgeon: Lorretta Harp, MD;  Location: [facility] CATH LAB;  Service: Cardiovascular;  Laterality: N/A;  . Colonoscopy N/A 12/11/2014    Dr. Gala Romney via colostomy. Normal. Repeat in 2021.   Marland Kitchen Esophagogastroduodenoscopy N/A 12/11/2014    [id number] EGD    Current Outpatient Prescriptions  Medication Sig Dispense Refill  . acetaminophen (TYLENOL) 500 MG tablet Take 1,000 mg by mouth every 6 (six) hours as needed for moderate pain.    Marland Kitchen amiodarone (PACERONE) 200 MG tablet Take 1 tablet (200 mg total) by mouth daily. 30 tablet 0  . apixaban (ELIQUIS) 5 MG TABS tablet Take 5 mg by mouth 2 (two) times daily.    Marland Kitchen co-enzyme Q-10 50 MG capsule Take 50 mg by mouth every morning.     . digoxin (LANOXIN) 0.125 MG tablet Take 1 tablet by mouth every other day.  0  . fluticasone (FLONASE) 50 MCG/ACT nasal spray Place 1 spray into both nostrils daily as needed for allergies.     . folic acid (FOLVITE) 1 MG tablet Take 1 tablet (1 mg total) by mouth daily.    . furosemide (LASIX) 40 MG tablet Take 1 tablet (40 mg total) by mouth daily. 30 tablet 0  . HYDROcodone-acetaminophen (NORCO/VICODIN) 5-325 MG per tablet Take 1-2 tablets by mouth every 6 (six) hours as needed. 30 tablet 0  . insulin aspart (NOVOLOG FLEXPEN) 100 UNIT/ML FlexPen Inject 6 Units into the skin 3 (three) times daily with meals. (Patient taking differently: Inject 12-24 Units into the skin 3 (three) times daily with meals. Per sliding scale.) 15 mL 11  . isosorbide dinitrate (ISORDIL) 30 MG tablet Take 15 mg by mouth 2 (two) times daily.     . meclizine (ANTIVERT) 25 MG tablet Take 25 mg by mouth as needed for dizziness.    . metFORMIN (GLUCOPHAGE) 500 MG tablet Take 1 tablet by mouth 2 (two) times daily.  0  . metoprolol succinate (TOPROL-XL) 100 MG 24 hr tablet Take 100 mg by mouth daily. Take with or immediately following a meal.     . nitroGLYCERIN (NITROLINGUAL) 0.4 MG/SPRAY spray Place 1 spray under the tongue every 5 (five) minutes as needed. angina 12 g 3  . Omega-3 Fatty Acids (FISH OIL) 1200 MG CAPS Take 1,200 mg by mouth 2 (two) times daily.      . pantoprazole (PROTONIX) 40 MG tablet Take 1 tablet (40 mg total) by mouth daily. 30 tablet 6  . rosuvastatin (CRESTOR) 40 MG tablet Take 1 tablet (40 mg total) by mouth at bedtime. 90 tablet 3  . spironolactone (ALDACTONE) 25 MG tablet Take 12.5 mg by mouth daily.     No current facility-administered medications for this visit.    Allergies as of 01/17/2015  . (  No Known Allergies)    Family History  Problem Relation Age of Onset  . Colon cancer Mother 75  . Colon cancer Sister 63  . Coronary artery disease Father   . Colon cancer Other     2 cousins, succumbed to illness    History   Social History  . Marital Status: Married    Spouse Name: N/A  . Number of Children: 1  . Years of Education: N/A   Occupational History  .     Social History Main Topics  . Smoking status: Never Smoker   . Smokeless tobacco: Never Used  . Alcohol Use: No     Comment: Former user 45 years ago  . Drug Use: No  . Sexual Activity: No   Other Topics Concern  . None   Social History Narrative    Review of Systems: Gen: Denies fever, chills, anorexia. Denies fatigue, weakness, weight loss.  CV: Denies chest pain, palpitations, syncope, peripheral edema, and claudication. Resp: sinus exacerbation GI: see HPI Derm: Denies rash, itching, dry skin Psych: Denies depression, anxiety, memory loss, confusion. No homicidal or suicidal ideation.  Heme: Denies bruising, bleeding, and enlarged lymph nodes.  Physical Exam: BP 103/66 mmHg  Pulse 90  Temp(Src) 97.8 F (36.6 C) (Oral)  Ht 5\' 10"  (1.778 m)  Wt 201 lb 12.8 oz (91.536 kg)  BMI 28.96 kg/m2 General:   Alert and oriented. No distress noted. Pleasant and cooperative.  Head:  Normocephalic and  atraumatic. Eyes:  Conjuctiva clear without scleral icterus. Mouth:  Oral mucosa pink and moist. Good dentition. No lesions. Heart:  S1, S2 present without murmurs Abdomen:  +BS, soft, non-tender and non-distended. Obese. Colostomy intact. No rebound or guarding. Extremities:  Without edema. Neurologic:  Alert and  oriented x4;  grossly normal neurologically. Skin:  Intact without significant lesions or rashes. Psych:  Alert and cooperative. Normal mood and affect.  Lab Results  Component Value Date   ALT 45 01/10/2015   AST 68* 01/10/2015   ALKPHOS 95 01/10/2015   BILITOT 0.5 01/10/2015

## 2015-01-20 DIAGNOSIS — S066X0D Traumatic subarachnoid hemorrhage without loss of consciousness, subsequent encounter: Secondary | ICD-10-CM | POA: Diagnosis not present

## 2015-01-20 DIAGNOSIS — N183 Chronic kidney disease, stage 3 (moderate): Secondary | ICD-10-CM | POA: Diagnosis not present

## 2015-01-20 DIAGNOSIS — I480 Paroxysmal atrial fibrillation: Secondary | ICD-10-CM | POA: Diagnosis not present

## 2015-01-20 DIAGNOSIS — I5022 Chronic systolic (congestive) heart failure: Secondary | ICD-10-CM | POA: Diagnosis not present

## 2015-01-20 DIAGNOSIS — W19XXXD Unspecified fall, subsequent encounter: Secondary | ICD-10-CM | POA: Diagnosis not present

## 2015-01-20 DIAGNOSIS — I2510 Atherosclerotic heart disease of native coronary artery without angina pectoris: Secondary | ICD-10-CM | POA: Diagnosis not present

## 2015-01-20 NOTE — Sleep Study (Signed)
Robert Branch A. Merlene Laughter, MD     [email or web address]        NOCTURNAL POLYSOMNOGRAM    LOCATION: SLEEP LAB FACILITY: [facility]   PHYSICIAN: Jontez Redfield A. Merlene [name], M.D.   DATE OF STUDY: 01/14/2015.   REFERRING PHYSICIAN: J. Memon.  PCP: Delphina Cahill.   INDICATIONS: The patient is a 59 year old man who presents with witnessed apnea, snoring and hypersomnia.  MEDICATIONS:  Prior to Admission medications   Medication Sig Start Date End Date Taking? Authorizing Provider  acetaminophen (TYLENOL) 500 MG tablet Take 1,000 mg by mouth every 6 (six) hours as needed for moderate pain.    Historical Provider, MD  amiodarone (PACERONE) 200 MG tablet Take 1 tablet (200 mg total) by mouth daily. 05/23/14   Maryann Mikhail, DO  apixaban (ELIQUIS) 5 MG TABS tablet Take 5 mg by mouth 2 (two) times daily.    Historical Provider, MD  co-enzyme Q-10 50 MG capsule Take 50 mg by mouth every morning.     Historical Provider, MD  digoxin (LANOXIN) 0.125 MG tablet Take 1 tablet by mouth every other day. 10/29/14   Historical Provider, MD  fluticasone (FLONASE) 50 MCG/ACT nasal spray Place 1 spray into both nostrils daily as needed for allergies.  11/21/13   Historical Provider, MD  folic acid (FOLVITE) 1 MG tablet Take 1 tablet (1 mg total) by mouth daily. 01/06/15   Radene Gunning, NP  furosemide (LASIX) 40 MG tablet Take 1 tablet (40 mg total) by mouth daily. 01/06/15   Radene Gunning, NP  HYDROcodone-acetaminophen (NORCO/VICODIN) 5-325 MG per tablet Take 1-2 tablets by mouth every 6 (six) hours as needed. 01/17/15   Orvil Feil, NP  insulin aspart (NOVOLOG FLEXPEN) 100 UNIT/ML FlexPen Inject 6 Units into the skin 3 (three) times daily with meals. Patient taking differently: Inject 12-24 Units into the skin 3 (three) times daily with meals. Per sliding scale. 11/20/14   Radene Gunning, NP  isosorbide dinitrate (ISORDIL) 30 MG tablet Take 15 mg by mouth 2 (two) times daily.  07/31/14   Historical Provider,  MD  meclizine (ANTIVERT) 25 MG tablet Take 25 mg by mouth as needed for dizziness.    Historical Provider, MD  metFORMIN (GLUCOPHAGE) 500 MG tablet Take 1 tablet by mouth 2 (two) times daily. 12/31/14   Historical Provider, MD  metoprolol succinate (TOPROL-XL) 100 MG 24 hr tablet Take 100 mg by mouth daily. Take with or immediately following a meal.    Historical Provider, MD  nitroGLYCERIN (NITROLINGUAL) 0.4 MG/SPRAY spray Place 1 spray under the tongue every 5 (five) minutes as needed. angina 07/19/14   Arnoldo Lenis, MD  Omega-3 Fatty Acids (FISH OIL) 1200 MG CAPS Take 1,200 mg by mouth 2 (two) times daily.      Historical Provider, MD  pantoprazole (PROTONIX) 40 MG tablet Take 1 tablet (40 mg total) by mouth daily. 07/19/14   Arnoldo Lenis, MD  rosuvastatin (CRESTOR) 40 MG tablet Take 1 tablet (40 mg total) by mouth at bedtime. 09/27/14   Arnoldo Lenis, MD  spironolactone (ALDACTONE) 25 MG tablet Take 12.5 mg by mouth daily.    Historical Provider, MD      EPWORTH SLEEPINESS SCALE: 14.   BMI: 29.   ARCHITECTURAL SUMMARY: This is a split-night recording with initial portion been a diagnostic study and the second portion now titration study. Total recording time was 361 minutes. Sleep efficiency 65 %. Sleep latency 12 minutes. REM latency not applicable minutes.  Stage NI 24 %, N2 75 % and N3 0.2 % and REM sleep 0 %.    RESPIRATORY DATA:  Baseline oxygen saturation is 98 %. The lowest saturation is 88 %. The diagnostic AHI is 52. The events were exclusively central in nature. The patient was placed on positive pressure is starting at 4 and increase to 6. The patient did not tolerate conventional CPAP and was placed on bilevel pressures. He was started on bilevel pressures of 8/4 and increase to 9/5 which he responded well to.  LIMB MOVEMENT SUMMARY: PLM index 9.   ELECTROCARDIOGRAM SUMMARY: Average heart rate is 111 with no significant dysrhythmias observed.   IMPRESSION:  1.  Severe central sleep apnea syndrome which responds well to bilevel pressures of 9/5. 2. Sinus tachycardia. 3. Abnormal sleep architecture with absent REM sleep, reduced slow-wave sleep and increase stage I and stage II sleeps.  Thanks for this referral.  Brookelin Felber A. Merlene [name], M.D. Diplomat, Tax adviser of Sleep Medicine.

## 2015-01-21 ENCOUNTER — Telehealth: Payer: Self-pay | Admitting: Gastroenterology

## 2015-01-21 DIAGNOSIS — I2510 Atherosclerotic heart disease of native coronary artery without angina pectoris: Secondary | ICD-10-CM | POA: Diagnosis not present

## 2015-01-21 DIAGNOSIS — S066X0D Traumatic subarachnoid hemorrhage without loss of consciousness, subsequent encounter: Secondary | ICD-10-CM | POA: Diagnosis not present

## 2015-01-21 DIAGNOSIS — W19XXXD Unspecified fall, subsequent encounter: Secondary | ICD-10-CM | POA: Diagnosis not present

## 2015-01-21 DIAGNOSIS — I5022 Chronic systolic (congestive) heart failure: Secondary | ICD-10-CM | POA: Diagnosis not present

## 2015-01-21 DIAGNOSIS — I480 Paroxysmal atrial fibrillation: Secondary | ICD-10-CM | POA: Diagnosis not present

## 2015-01-21 DIAGNOSIS — N183 Chronic kidney disease, stage 3 (moderate): Secondary | ICD-10-CM | POA: Diagnosis not present

## 2015-01-21 DIAGNOSIS — R1011 Right upper quadrant pain: Secondary | ICD-10-CM | POA: Insufficient documentation

## 2015-01-21 NOTE — Assessment & Plan Note (Signed)
Persistent RUQ pain with ultrasound and HIDA on file. EGD up-to-date and normal. Mildly dilated CBD at 8 mm on most recent US. Check LFTs now. May ultimately need cholecystectomy with intraoperative cholangiogram. MRI/MRCP likely not possible with defibrillator/pacemaker. Refer to Dr. Dalbert Batman. Contact office if any worsening pain or associated symptoms.

## 2015-01-21 NOTE — Assessment & Plan Note (Signed)
Original diagnosis in 2008, s/p APR. Next colonoscopy due 2021.

## 2015-01-21 NOTE — Assessment & Plan Note (Signed)
Noted chronically. Start Miralax once daily to twice a day as needed. Return in 3 months.

## 2015-01-21 NOTE — Telephone Encounter (Signed)
Needs to complete LFTs, ordered last week at office visit.

## 2015-01-22 ENCOUNTER — Telehealth (HOSPITAL_COMMUNITY): Payer: Self-pay

## 2015-01-22 ENCOUNTER — Encounter (INDEPENDENT_AMBULATORY_CARE_PROVIDER_SITE_OTHER): Payer: Medicare Other

## 2015-01-22 DIAGNOSIS — R0989 Other specified symptoms and signs involving the circulatory and respiratory systems: Secondary | ICD-10-CM | POA: Diagnosis not present

## 2015-01-22 DIAGNOSIS — M79606 Pain in leg, unspecified: Secondary | ICD-10-CM

## 2015-01-22 NOTE — Telephone Encounter (Signed)
Pt has been called four different times to try and schedule eval.... (1/25 there was no answer and unable to leave a message; 2/9 busy signal; 2/10 busy signal; 2/17 busy signal)  Unable to contact patient.

## 2015-01-22 NOTE — Telephone Encounter (Signed)
Reminder letter and copy of the lab order mailed to the pt.

## 2015-01-23 DIAGNOSIS — I2510 Atherosclerotic heart disease of native coronary artery without angina pectoris: Secondary | ICD-10-CM | POA: Diagnosis not present

## 2015-01-23 DIAGNOSIS — I5022 Chronic systolic (congestive) heart failure: Secondary | ICD-10-CM | POA: Diagnosis not present

## 2015-01-23 DIAGNOSIS — S066X0D Traumatic subarachnoid hemorrhage without loss of consciousness, subsequent encounter: Secondary | ICD-10-CM | POA: Diagnosis not present

## 2015-01-23 DIAGNOSIS — W19XXXD Unspecified fall, subsequent encounter: Secondary | ICD-10-CM | POA: Diagnosis not present

## 2015-01-23 DIAGNOSIS — N183 Chronic kidney disease, stage 3 (moderate): Secondary | ICD-10-CM | POA: Diagnosis not present

## 2015-01-23 DIAGNOSIS — I480 Paroxysmal atrial fibrillation: Secondary | ICD-10-CM | POA: Diagnosis not present

## 2015-01-24 DIAGNOSIS — W19XXXD Unspecified fall, subsequent encounter: Secondary | ICD-10-CM | POA: Diagnosis not present

## 2015-01-24 DIAGNOSIS — I480 Paroxysmal atrial fibrillation: Secondary | ICD-10-CM | POA: Diagnosis not present

## 2015-01-24 DIAGNOSIS — I5022 Chronic systolic (congestive) heart failure: Secondary | ICD-10-CM | POA: Diagnosis not present

## 2015-01-24 DIAGNOSIS — I2510 Atherosclerotic heart disease of native coronary artery without angina pectoris: Secondary | ICD-10-CM | POA: Diagnosis not present

## 2015-01-24 DIAGNOSIS — N183 Chronic kidney disease, stage 3 (moderate): Secondary | ICD-10-CM | POA: Diagnosis not present

## 2015-01-24 DIAGNOSIS — S066X0D Traumatic subarachnoid hemorrhage without loss of consciousness, subsequent encounter: Secondary | ICD-10-CM | POA: Diagnosis not present

## 2015-01-27 DIAGNOSIS — I5022 Chronic systolic (congestive) heart failure: Secondary | ICD-10-CM | POA: Diagnosis not present

## 2015-01-27 DIAGNOSIS — W19XXXD Unspecified fall, subsequent encounter: Secondary | ICD-10-CM | POA: Diagnosis not present

## 2015-01-27 DIAGNOSIS — I2510 Atherosclerotic heart disease of native coronary artery without angina pectoris: Secondary | ICD-10-CM | POA: Diagnosis not present

## 2015-01-27 DIAGNOSIS — S066X0D Traumatic subarachnoid hemorrhage without loss of consciousness, subsequent encounter: Secondary | ICD-10-CM | POA: Diagnosis not present

## 2015-01-27 DIAGNOSIS — K5900 Constipation, unspecified: Secondary | ICD-10-CM | POA: Diagnosis not present

## 2015-01-27 DIAGNOSIS — I480 Paroxysmal atrial fibrillation: Secondary | ICD-10-CM | POA: Diagnosis not present

## 2015-01-27 DIAGNOSIS — N183 Chronic kidney disease, stage 3 (moderate): Secondary | ICD-10-CM | POA: Diagnosis not present

## 2015-01-28 DIAGNOSIS — I2510 Atherosclerotic heart disease of native coronary artery without angina pectoris: Secondary | ICD-10-CM | POA: Diagnosis not present

## 2015-01-28 DIAGNOSIS — I480 Paroxysmal atrial fibrillation: Secondary | ICD-10-CM | POA: Diagnosis not present

## 2015-01-28 DIAGNOSIS — N183 Chronic kidney disease, stage 3 (moderate): Secondary | ICD-10-CM | POA: Diagnosis not present

## 2015-01-28 DIAGNOSIS — I5022 Chronic systolic (congestive) heart failure: Secondary | ICD-10-CM | POA: Diagnosis not present

## 2015-01-28 DIAGNOSIS — W19XXXD Unspecified fall, subsequent encounter: Secondary | ICD-10-CM | POA: Diagnosis not present

## 2015-01-28 DIAGNOSIS — S066X0D Traumatic subarachnoid hemorrhage without loss of consciousness, subsequent encounter: Secondary | ICD-10-CM | POA: Diagnosis not present

## 2015-01-28 LAB — HEPATIC FUNCTION PANEL
ALT: 77 U/L — ABNORMAL HIGH (ref 0–53)
AST: 110 U/L — ABNORMAL HIGH (ref 0–37)
Albumin: 2.8 g/dL — ABNORMAL LOW (ref 3.5–5.2)
Alkaline Phosphatase: 182 U/L — ABNORMAL HIGH (ref 39–117)
BILIRUBIN DIRECT: 0.2 mg/dL (ref 0.0–0.3)
BILIRUBIN TOTAL: 0.6 mg/dL (ref 0.2–1.2)
Indirect Bilirubin: 0.4 mg/dL (ref 0.2–1.2)
Total Protein: 6.8 g/dL (ref 6.0–8.3)

## 2015-01-28 NOTE — Progress Notes (Signed)
CC'ED TO PCP 

## 2015-01-30 ENCOUNTER — Other Ambulatory Visit (INDEPENDENT_AMBULATORY_CARE_PROVIDER_SITE_OTHER): Payer: Self-pay | Admitting: General Surgery

## 2015-01-30 ENCOUNTER — Other Ambulatory Visit (INDEPENDENT_AMBULATORY_CARE_PROVIDER_SITE_OTHER): Payer: Self-pay | Admitting: *Deleted

## 2015-01-30 ENCOUNTER — Telehealth: Payer: Self-pay | Admitting: *Deleted

## 2015-01-30 DIAGNOSIS — I5022 Chronic systolic (congestive) heart failure: Secondary | ICD-10-CM | POA: Diagnosis not present

## 2015-01-30 DIAGNOSIS — K8020 Calculus of gallbladder without cholecystitis without obstruction: Secondary | ICD-10-CM | POA: Diagnosis not present

## 2015-01-30 DIAGNOSIS — I10 Essential (primary) hypertension: Secondary | ICD-10-CM | POA: Diagnosis not present

## 2015-01-30 DIAGNOSIS — Z85048 Personal history of other malignant neoplasm of rectum, rectosigmoid junction, and anus: Secondary | ICD-10-CM | POA: Diagnosis not present

## 2015-01-30 DIAGNOSIS — R634 Abnormal weight loss: Secondary | ICD-10-CM | POA: Diagnosis not present

## 2015-01-30 DIAGNOSIS — R1011 Right upper quadrant pain: Secondary | ICD-10-CM

## 2015-01-30 DIAGNOSIS — Z95810 Presence of automatic (implantable) cardiac defibrillator: Secondary | ICD-10-CM | POA: Diagnosis not present

## 2015-01-30 DIAGNOSIS — W19XXXD Unspecified fall, subsequent encounter: Secondary | ICD-10-CM | POA: Diagnosis not present

## 2015-01-30 DIAGNOSIS — S066X0D Traumatic subarachnoid hemorrhage without loss of consciousness, subsequent encounter: Secondary | ICD-10-CM | POA: Diagnosis not present

## 2015-01-30 DIAGNOSIS — I255 Ischemic cardiomyopathy: Secondary | ICD-10-CM | POA: Diagnosis not present

## 2015-01-30 DIAGNOSIS — I5020 Unspecified systolic (congestive) heart failure: Secondary | ICD-10-CM | POA: Diagnosis not present

## 2015-01-30 DIAGNOSIS — Z8546 Personal history of malignant neoplasm of prostate: Secondary | ICD-10-CM | POA: Diagnosis not present

## 2015-01-30 DIAGNOSIS — N183 Chronic kidney disease, stage 3 (moderate): Secondary | ICD-10-CM | POA: Diagnosis not present

## 2015-01-30 DIAGNOSIS — I480 Paroxysmal atrial fibrillation: Secondary | ICD-10-CM | POA: Diagnosis not present

## 2015-01-30 DIAGNOSIS — I2510 Atherosclerotic heart disease of native coronary artery without angina pectoris: Secondary | ICD-10-CM | POA: Diagnosis not present

## 2015-01-30 DIAGNOSIS — E119 Type 2 diabetes mellitus without complications: Secondary | ICD-10-CM | POA: Diagnosis not present

## 2015-01-30 DIAGNOSIS — I482 Chronic atrial fibrillation: Secondary | ICD-10-CM | POA: Diagnosis not present

## 2015-01-30 NOTE — Telephone Encounter (Signed)
-----   Message from Arnoldo Lenis, MD sent at 01/29/2015 12:04 PM EST ----- Overall circulation in legs looks good  Zandra Abts MD

## 2015-01-30 NOTE — Telephone Encounter (Signed)
Notes Recorded by Laurine Blazer, LPN on 5/46/5681 at 2:75 PM Patient notified. Has follow up scheduled for 02/10/2015 with Dr. Harl Bowie.

## 2015-01-30 NOTE — Addendum Note (Signed)
Addended by: Adin Hector on: 01/30/2015 01:25 PM   Modules accepted: Orders

## 2015-01-31 ENCOUNTER — Other Ambulatory Visit: Payer: Self-pay | Admitting: Cardiovascular Disease

## 2015-01-31 DIAGNOSIS — I480 Paroxysmal atrial fibrillation: Secondary | ICD-10-CM | POA: Diagnosis not present

## 2015-01-31 DIAGNOSIS — I5022 Chronic systolic (congestive) heart failure: Secondary | ICD-10-CM | POA: Diagnosis not present

## 2015-01-31 DIAGNOSIS — I2510 Atherosclerotic heart disease of native coronary artery without angina pectoris: Secondary | ICD-10-CM | POA: Diagnosis not present

## 2015-01-31 DIAGNOSIS — N183 Chronic kidney disease, stage 3 (moderate): Secondary | ICD-10-CM | POA: Diagnosis not present

## 2015-01-31 DIAGNOSIS — S066X0D Traumatic subarachnoid hemorrhage without loss of consciousness, subsequent encounter: Secondary | ICD-10-CM | POA: Diagnosis not present

## 2015-01-31 DIAGNOSIS — W19XXXD Unspecified fall, subsequent encounter: Secondary | ICD-10-CM | POA: Diagnosis not present

## 2015-02-03 DIAGNOSIS — I480 Paroxysmal atrial fibrillation: Secondary | ICD-10-CM | POA: Diagnosis not present

## 2015-02-03 DIAGNOSIS — I4891 Unspecified atrial fibrillation: Secondary | ICD-10-CM | POA: Diagnosis not present

## 2015-02-03 DIAGNOSIS — S066X0D Traumatic subarachnoid hemorrhage without loss of consciousness, subsequent encounter: Secondary | ICD-10-CM | POA: Diagnosis not present

## 2015-02-03 DIAGNOSIS — I509 Heart failure, unspecified: Secondary | ICD-10-CM | POA: Diagnosis not present

## 2015-02-03 DIAGNOSIS — N183 Chronic kidney disease, stage 3 (moderate): Secondary | ICD-10-CM | POA: Diagnosis not present

## 2015-02-03 DIAGNOSIS — I2510 Atherosclerotic heart disease of native coronary artery without angina pectoris: Secondary | ICD-10-CM | POA: Diagnosis not present

## 2015-02-03 DIAGNOSIS — I5022 Chronic systolic (congestive) heart failure: Secondary | ICD-10-CM | POA: Diagnosis not present

## 2015-02-03 DIAGNOSIS — W19XXXD Unspecified fall, subsequent encounter: Secondary | ICD-10-CM | POA: Diagnosis not present

## 2015-02-03 DIAGNOSIS — G4731 Primary central sleep apnea: Secondary | ICD-10-CM | POA: Diagnosis not present

## 2015-02-05 DIAGNOSIS — I5022 Chronic systolic (congestive) heart failure: Secondary | ICD-10-CM | POA: Diagnosis not present

## 2015-02-05 DIAGNOSIS — I480 Paroxysmal atrial fibrillation: Secondary | ICD-10-CM | POA: Diagnosis not present

## 2015-02-05 DIAGNOSIS — S066X0D Traumatic subarachnoid hemorrhage without loss of consciousness, subsequent encounter: Secondary | ICD-10-CM | POA: Diagnosis not present

## 2015-02-05 DIAGNOSIS — I2510 Atherosclerotic heart disease of native coronary artery without angina pectoris: Secondary | ICD-10-CM | POA: Diagnosis not present

## 2015-02-05 DIAGNOSIS — W19XXXD Unspecified fall, subsequent encounter: Secondary | ICD-10-CM | POA: Diagnosis not present

## 2015-02-05 DIAGNOSIS — N183 Chronic kidney disease, stage 3 (moderate): Secondary | ICD-10-CM | POA: Diagnosis not present

## 2015-02-06 DIAGNOSIS — I480 Paroxysmal atrial fibrillation: Secondary | ICD-10-CM | POA: Diagnosis not present

## 2015-02-06 DIAGNOSIS — I2510 Atherosclerotic heart disease of native coronary artery without angina pectoris: Secondary | ICD-10-CM | POA: Diagnosis not present

## 2015-02-06 DIAGNOSIS — N183 Chronic kidney disease, stage 3 (moderate): Secondary | ICD-10-CM | POA: Diagnosis not present

## 2015-02-06 DIAGNOSIS — S066X0D Traumatic subarachnoid hemorrhage without loss of consciousness, subsequent encounter: Secondary | ICD-10-CM | POA: Diagnosis not present

## 2015-02-06 DIAGNOSIS — I5022 Chronic systolic (congestive) heart failure: Secondary | ICD-10-CM | POA: Diagnosis not present

## 2015-02-06 DIAGNOSIS — W19XXXD Unspecified fall, subsequent encounter: Secondary | ICD-10-CM | POA: Diagnosis not present

## 2015-02-07 ENCOUNTER — Ambulatory Visit
Admission: RE | Admit: 2015-02-07 | Discharge: 2015-02-07 | Disposition: A | Discharge status: Home / Self Care | Payer: Medicare Other | Source: Ambulatory Visit | Attending: General Surgery | Admitting: General Surgery

## 2015-02-07 DIAGNOSIS — I10 Essential (primary) hypertension: Secondary | ICD-10-CM | POA: Diagnosis not present

## 2015-02-07 DIAGNOSIS — I480 Paroxysmal atrial fibrillation: Secondary | ICD-10-CM | POA: Diagnosis not present

## 2015-02-07 DIAGNOSIS — R110 Nausea: Secondary | ICD-10-CM | POA: Diagnosis not present

## 2015-02-07 DIAGNOSIS — I5022 Chronic systolic (congestive) heart failure: Secondary | ICD-10-CM | POA: Diagnosis not present

## 2015-02-07 DIAGNOSIS — W19XXXD Unspecified fall, subsequent encounter: Secondary | ICD-10-CM | POA: Diagnosis not present

## 2015-02-07 DIAGNOSIS — N183 Chronic kidney disease, stage 3 (moderate): Secondary | ICD-10-CM | POA: Diagnosis not present

## 2015-02-07 DIAGNOSIS — Z85048 Personal history of other malignant neoplasm of rectum, rectosigmoid junction, and anus: Secondary | ICD-10-CM | POA: Diagnosis not present

## 2015-02-07 DIAGNOSIS — I2510 Atherosclerotic heart disease of native coronary artery without angina pectoris: Secondary | ICD-10-CM | POA: Diagnosis not present

## 2015-02-07 DIAGNOSIS — K8020 Calculus of gallbladder without cholecystitis without obstruction: Secondary | ICD-10-CM | POA: Diagnosis not present

## 2015-02-07 DIAGNOSIS — R634 Abnormal weight loss: Secondary | ICD-10-CM | POA: Diagnosis not present

## 2015-02-07 DIAGNOSIS — Z79899 Other long term (current) drug therapy: Secondary | ICD-10-CM | POA: Diagnosis not present

## 2015-02-07 DIAGNOSIS — S066X0D Traumatic subarachnoid hemorrhage without loss of consciousness, subsequent encounter: Secondary | ICD-10-CM | POA: Diagnosis not present

## 2015-02-10 ENCOUNTER — Ambulatory Visit (INDEPENDENT_AMBULATORY_CARE_PROVIDER_SITE_OTHER): Payer: Medicare Other | Admitting: Cardiology

## 2015-02-10 ENCOUNTER — Encounter: Payer: Self-pay | Admitting: Cardiology

## 2015-02-10 VITALS — BP 102/58 | HR 47 | Ht 70.0 in | Wt 211.0 lb

## 2015-02-10 DIAGNOSIS — I480 Paroxysmal atrial fibrillation: Secondary | ICD-10-CM | POA: Diagnosis not present

## 2015-02-10 DIAGNOSIS — Z01810 Encounter for preprocedural cardiovascular examination: Secondary | ICD-10-CM

## 2015-02-10 DIAGNOSIS — R072 Precordial pain: Secondary | ICD-10-CM

## 2015-02-10 DIAGNOSIS — M79606 Pain in leg, unspecified: Secondary | ICD-10-CM

## 2015-02-10 DIAGNOSIS — I2510 Atherosclerotic heart disease of native coronary artery without angina pectoris: Secondary | ICD-10-CM

## 2015-02-10 DIAGNOSIS — Z7901 Long term (current) use of anticoagulants: Secondary | ICD-10-CM | POA: Diagnosis not present

## 2015-02-10 DIAGNOSIS — E785 Hyperlipidemia, unspecified: Secondary | ICD-10-CM

## 2015-02-10 DIAGNOSIS — I5022 Chronic systolic (congestive) heart failure: Secondary | ICD-10-CM | POA: Diagnosis not present

## 2015-02-10 MED ORDER — FUROSEMIDE 40 MG PO TABS: 40.0000 mg | ORAL_TABLET | Freq: Every day | ORAL | Status: DC | PRN

## 2015-02-10 NOTE — Progress Notes (Signed)
Clinical Summary Mr. Mazzie is a 59 y.o.male seen today for follow up of the following medical problems . Extensive hospital records reviewed during this visit.   1. CAD/ICM  - prior BMS to LAD in 2001, repeat BMS to LAD in 2009. 08/2013 LVEF 30-35%. Echo 09/2014 LVEF 30-35%.  - he has BiV AICD placed by Dr Caryl Comes, followed by Dr Lovena Le..  - last cath 07/2013 as reported below, no culprit vessels, managed medically - he is followed in [facility] as well for CHF - medical therapy has been scaled back recently due to problems with orthostatic dizziness and falls - denies any recent SOB or LE edema. Weight stable aroudn 200 lbs at home. Only taking lasix prn, has only used once over the last few weeks . - can have aching pain midchest, 3-4/10. Typically occurs with activity, but can occur with drinking cold liquids. Feels symptoms have worsened since being off ranexa. Ranexa was stopped during recent admission for AMS, from neuro notes thought could be related to celexa or ranexa.    2. Afib/Aflutter  - denies any palpitations  - compliant with eliquis - dig was held recently in setting of AKI however appears it was  restarted at 0.125 mg every other day  3. Hyperlipidemia  - compliant with statin    4. Leg pain - normal ABIs recently - still has occasional symptoms  5. Bilateral carotid stenosis - 01/2014 carotid US <50% bilateral - no neuro symptoms  6. Subarachnoid hemorrhage - admitted earlier this year after fall and head trauma. Managed conservatiely, serial imaging showed improvement of bleed. Anticoag was held and then ultimately restarted - just finished rehab and has regained his strength  7. CKD - AKI during recent admit, discharge Cr 3 - followed by Dr Hinda Lenis  8. Delerium - seen by neuro during last admit for AMS and tremor. Thought potentially related to celexa or ranexa. Meds were stopped, symptoms improved. From notes appears he was also uremic at the  time, with AKI thought these medications were building up in his system.   9. OSA - abnormal sleep study  - Dr Arsenio Katz is working on setting up CPAP machine.   10. Preop evaluation - being considered for possible gallbladder surgery - patient reports no set day, and that recent scan came back normal.   Past Medical History  Diagnosis Date  . Essential hypertension, benign   . Type 2 diabetes mellitus   . CAD (coronary artery disease)     a. BMS to LAD 2001 at [facility] b. PTCA/atherectomy ramus and BMS to LAD 2009  . Chronic systolic heart failure     a. 2D ECHO: 03/01/2014 EF 15-20%, severe, diffuse hypsokinesis. Mod LV dilation. Mod LVH, indeterminate diastolic dysfxn (poor study), high vent filling pressures, abnormal septal wall motion and dyssynergy c/w RV pacing, mild MR, mod LA dilation, RV systolic function mod reduced, mild TR, PA pressure 42.   Marland Kitchen GERD (gastroesophageal reflux disease)   . Cardiomyopathy, ischemic     a. BIV ICD St. Jude, LVEF 23%  . Paroxysmal atrial fibrillation     a. on amiodarone, digoxin and Eliquis  . Adenocarcinoma of rectum     a. 2008  . Prostate cancer     a. s/p seed implants with chemo and radiation  . 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 St Jude  . TIA (transient ischemic attack)   . OSA on CPAP   .  HLD (hyperlipidemia)   . Orthostatic hypotension   . Dizziness     a. chronic. Admission for this 07/18/2014  . History of falling July 2015    due to dizziness from medications  . CHF (congestive heart failure)   . Renal insufficiency      No Known Allergies   Current Outpatient Prescriptions  Medication Sig Dispense Refill  . acetaminophen (TYLENOL) 500 MG tablet Take 1,000 mg by mouth every 6 (six) hours as needed for moderate pain.    Marland Kitchen amiodarone (PACERONE) 200 MG tablet Take 1 tablet (200 mg total) by mouth daily. 30 tablet 0  . apixaban (ELIQUIS) 5 MG TABS tablet Take 5 mg by mouth 2 (two) times daily.     Marland Kitchen co-enzyme Q-10 50 MG capsule Take 50 mg by mouth every morning.     . digoxin (LANOXIN) 0.125 MG tablet Take 1 tablet by mouth every other day.  0  . fluticasone (FLONASE) 50 MCG/ACT nasal spray Place 1 spray into both nostrils daily as needed for allergies.     . folic acid (FOLVITE) 1 MG tablet Take 1 tablet (1 mg total) by mouth daily.    . furosemide (LASIX) 40 MG tablet Take 1 tablet (40 mg total) by mouth daily. 30 tablet 0  . HYDROcodone-acetaminophen (NORCO/VICODIN) 5-325 MG per tablet Take 1-2 tablets by mouth every 6 (six) hours as needed. 30 tablet 0  . insulin aspart (NOVOLOG FLEXPEN) 100 UNIT/ML FlexPen Inject 6 Units into the skin 3 (three) times daily with meals. (Patient taking differently: Inject 12-24 Units into the skin 3 (three) times daily with meals. Per sliding scale.) 15 mL 11  . isosorbide dinitrate (ISORDIL) 30 MG tablet Take 15 mg by mouth 2 (two) times daily.     . meclizine (ANTIVERT) 25 MG tablet Take 25 mg by mouth as needed for dizziness.    . metFORMIN (GLUCOPHAGE) 500 MG tablet Take 1 tablet by mouth 2 (two) times daily.  0  . metoprolol succinate (TOPROL-XL) 100 MG 24 hr tablet Take 100 mg by mouth daily. Take with or immediately following a meal.    . nitroGLYCERIN (NITROLINGUAL) 0.4 MG/SPRAY spray Place 1 spray under the tongue every 5 (five) minutes as needed. angina 12 g 3  . Omega-3 Fatty Acids (FISH OIL) 1200 MG CAPS Take 1,200 mg by mouth 2 (two) times daily.      . pantoprazole (PROTONIX) 40 MG tablet Take 1 tablet (40 mg total) by mouth daily. 30 tablet 6  . rosuvastatin (CRESTOR) 40 MG tablet Take 1 tablet (40 mg total) by mouth at bedtime. 90 tablet 3  . spironolactone (ALDACTONE) 25 MG tablet Take 12.5 mg by mouth daily.    Marland Kitchen spironolactone (ALDACTONE) 25 MG tablet Take 0.5 tablets (12.5 mg total) by mouth daily. 45 tablet 6   No current facility-administered medications for this visit.     Past Surgical History  Procedure Laterality Date  .  Internal defibrillator and pacemaker  2010    x2 St. Jude device  . Abdominal and perineal resection of rectum with total mesorectal excision  10/04/2007  . Colonoscopy      05/17/2007. IMPRESSION: Semilunar, apple-core neoplasm low in the rectum (palpable on digital rectal exam) beginning at 5 cm and corkscrewing up 5 cm in length. This was a low rectal lesion consistent with colorectal carcinoma. It was biosied multiple times. The upstream colon all the way to the cecum appeared normal. Recommendations: Followup on path. Surgical Consultation   .  Colonoscopy  09/14/2011    Dr. Gala Romney: via colostomy, Single pedunculated benign inflammatory polyp. Due for surveillance Oct 2015  . Colostomy    . Colonoscopy N/A 07/02/2014    Procedure: COLONOSCOPY;  Surgeon: Daneil Dolin, MD;  Location: [facility];  Service: Endoscopy;  Laterality: N/A;  7:30 / COLONOSCOPY THRU COLOSTOMY  . Esophagogastroduodenoscopy N/A 07/02/2014    Procedure: ESOPHAGOGASTRODUODENOSCOPY (EGD);  Surgeon: Daneil Dolin, MD;  Location: [facility];  Service: Endoscopy;  Laterality: N/A;  7:30  . Savory dilation N/A 07/02/2014    Procedure: SAVORY DILATION;  Surgeon: Daneil Dolin, MD;  Location: [facility];  Service: Endoscopy;  Laterality: N/A;  7:30  . Maloney dilation N/A 07/02/2014    Procedure: Venia Minks DILATION;  Surgeon: Daneil Dolin, MD;  Location: [facility];  Service: Endoscopy;  Laterality: N/A;  7:30  . Portacath placement  06/2007    "removed ~ 1 yr later"  . Cardiac catheterization  08/2001  . Coronary angioplasty with stent placement  2001; ~ 2006    "1 + 1"   . Left heart catheterization with coronary angiogram N/A 07/13/2013    Procedure: LEFT HEART CATHETERIZATION WITH CORONARY ANGIOGRAM;  Surgeon: Lorretta Harp, MD;  Location: [facility] CATH LAB;  Service: Cardiovascular;  Laterality: N/A;  . Colonoscopy N/A 12/11/2014    Dr. Gala Romney via colostomy. Normal. Repeat in 2021.   Marland Kitchen Esophagogastroduodenoscopy N/A  12/11/2014    VPX:TGGYIR EGD     No Known Allergies    Family History  Problem Relation Age of Onset  . Colon cancer Mother 67  . Colon cancer Sister 75  . Coronary artery disease Father   . Colon cancer Other     2 cousins, succumbed to illness     Social History Mr. Eichhorst reports that he has never smoked. He has never used smokeless tobacco. Mr. Lease reports that he does not drink alcohol.   Review of Systems CONSTITUTIONAL: No weight loss, fever, chills, weakness or fatigue.  HEENT: Eyes: No visual loss, blurred vision, double vision or yellow sclerae.No hearing loss, sneezing, congestion, runny nose or sore throat.  SKIN: No rash or itching.  CARDIOVASCULAR: per HPI RESPIRATORY: No shortness of breath, cough or sputum.  GASTROINTESTINAL: No anorexia, nausea, vomiting or diarrhea. No abdominal pain or blood.  GENITOURINARY: No burning on urination, no polyuria NEUROLOGICAL: No headache, dizziness, syncope, paralysis, ataxia, numbness or tingling in the extremities. No change in bowel or bladder control.  MUSCULOSKELETAL: No muscle, back pain, joint pain or stiffness.  LYMPHATICS: No enlarged nodes. No history of splenectomy.  PSYCHIATRIC: No history of depression or anxiety.  ENDOCRINOLOGIC: No reports of sweating, cold or heat intolerance. No polyuria or polydipsia.  Marland Kitchen   Physical Examination p 47 bp 102/58 Wt 211 lbs BMI 30 Orthostatics Lying: p 98 bp 102/66 Sitting: p 114 bp 90/50 Standing p 118 bp 86/54  Gen: resting comfortably, no acute distress HEENT: no scleral icterus, pupils equal round and reactive, no palptable cervical adenopathy,  CV: RRR, no m/r/g, no JVD Resp: Clear to auscultation bilaterally GI: abdomen is soft, non-tender, non-distended, normal bowel sounds, no hepatosplenomegaly MSK: extremities are warm, no edema.  Skin: warm, no rash Neuro:  no focal deficits Psych: appropriate affect   Diagnostic Studies 08/2013 Echo  LVEF 30-35%,  + WMAs, grade I diastolic dysfunction, mild MR,   07/2013 Cath  HEMODYNAMICS:   AO SYSTOLIC/AO DIASTOLIC: 485/46  ANGIOGRAPHIC RESULTS:   1. Left main;  normal  2. LAD; the entire proximal third of the LAD was fluoroscopically calcified. The proximal third had approximately 50-60% segmental stenosis. The stent was widely patent in the proximal third of the LAD with 30-40% in-stent restenosis". There was a moderate size first diagonal Gail Creekmore and the ramus distribution that was occluded in its proximal portion and filled by collaterals. This was noted to be highly diseased at his last cath in 2009. There was 50-60% segmental stenosis in the middle third and 70% in the distal/apical third. There were 2 small marginal branches arising from the middle third that had 90% ostial stenoses unchanged from prior cath 3. Left circumflex; dominant with 99% long segmental proximal OM1 stenosis in a small to medium-size vessel unchanged from prior cath 4. Right coronary artery; nondominant with 50% mid and 80% distal stenosis unchanged from prior cath  5. 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 The only change in his anatomy from 5 years ago was occlusion of the high first diagonal Yisrael Obryan which was severely diffusely diseased previously. There are no "culprit vessels. The patient is already on maximal medical therapy. Plans will be continued medical therapy. The sheath was removed and a TR Band was placed on the right wrist to achieve patent hemostasis. The patient left the Cath Lab in stable condition. He'll be gently hydrated and Coumadin will be restarted.   02/2014 Echo  Study Conclusions  - Procedure narrative: Transthoracic echocardiography. Image quality was suboptimal. The study was technically difficult, as a result of poor sound wave  transmission. - Left ventricle: Systolic function is severely reduced, estimated EF 15-20%. Severe diffuse hypokinesis is noted. The cavity size was moderately dilated. Wall thickness was increased in a pattern of moderate LVH. Diastolic dysfunction is seen, indeterminate grade. The apex was poorly visualized. Doppler parameters are consistent with high ventricular filling pressure. - Ventricular septum: Septal motion showed abnormal function and dyssynergy. These changes are consistent with right ventricular pacing. - Aortic valve: Trileaflet; mildly thickened leaflets. There was no stenosis. - Mitral valve: Mildly thickened leaflets . Mild tethering of leaflet motion due to severe left ventricular dysfunction. Mild regurgitation. - Left atrium: The atrium was mildly to moderately dilated. - Right ventricle: The cavity size was normal. Wall thickness was normal. Pacer wire or catheter noted in right ventricle. Systolic function was mildly to moderately reduced. - Right atrium: Pacer wire or catheter noted in right atrium. - Atrial septum: The septum bowed from left to right, consistent with increased left atrial pressure. - Tricuspid valve: Mild regurgitation. - Pulmonary arteries: PA peak pressure: 45mm Hg (S). Mildly elevated pulmonary pressures.   09/2014 Echo Study Conclusions  - Left ventricle: The cavity size was mildly dilated. Wall thickness was increased in a pattern of moderate LVH. Diastolic function is abnormal, indeterminant grade. Systolic function was moderately to severely reduced. The estimated ejection fraction was in the range of 30% to 35%. Evaluation of LVEF is limited by limited visualization of the endocardium, consider contrast study for more accurate evaluation. - Aorta: The visualized portion of the proximal ascending aorta is mildly dilated measuring 3.8 cm. - Aortic root: The aortic root was normal in size. - Left atrium: The  atrium was moderately dilated. - Right ventricle: Not well visualized. Grossly appears moderately enlarged with low normal systolic function. - Right atrium: Not well visualized. Grossly appears moderately enlarged. - Pulmonary arteries: PA peak  pressure: 35 mm Hg (S). PASP is borderline elevated. - Inferior vena cava: The vessel was normal in size. The respirophasic diameter changes were in the normal range (>= 50%), consistent with normal central venous pressure. - Technically difficult study.    Assessment and Plan  1.CAD/ICM/Chronic systolic HF - LVEF 07-37% by echo 09/2014, NYHA III, he has a BiV AICD with normal function last check 11/2014 - appears euvolemic today  - medical therapy had been limited due to low blood pressures, dizziness, and recent falls including a recent fall with a subarachonid hemorrhage - remains orthostatic in clinic today, has not used lasix in several weeks. Concern for recurrent fall, will stop his aldactone.   - notes some recent episodes of chest pain, will obtain lexiscan  2. Hyperlipidemia  - continue high dose statin in setting of know CAD.   3. Afib /aflutter  - no current symptoms, continue current meds  - bump in LFTs recently thought to be gallbladder related, he is followed by GI. Will follow trend, no clear indication amio is related at this time  4. Leg pain - normal ABIs, does not appear to be vascular in etiology  5. Subarachnoid hemorrhage - recent admit with head bleed after fall on anticoag. Managed conservatively, has been restarted on anticoag - continue anticoag, will decrease CHF meds further unfortunately as he continues to be severely orthostatic  6. Delerium - resolved. His ranexa and celexa have been stopped. Apperared to have occurred in setting of AKI along with uremia, some thought these meds were not being cleared and were causing symptoms - will not restart ranexa at this time, though may consider in  [place] as he had done well on it for some time.  7. OSA - awaiting CPAP machine  8. AKI on CKD - followed by Dr Hinda Lenis  9. Preoperative evaluation - obtain lexiscan in setting of known CAD and recent symptoms prior to any surgery. Appears definite decision for surgery has not been made.   F/u 2-3 weeks.      Arnoldo Lenis, M.D., F.A.C.C.

## 2015-02-10 NOTE — Patient Instructions (Addendum)
Your physician recommends that you schedule a follow-up appointment in: 2-3  With Dr Harl Bowie   Stop ALDACTONE    Take lasix 40 mg daily only as needed for leg swelling    Your physician has requested that you have a lexiscan myoview. For further information please visit [email or web address]. Please follow instruction sheet, as given.  HOLD ISORDIL DAY OF TEST     Thank you for choosing [facility] !

## 2015-02-11 DIAGNOSIS — I609 Nontraumatic subarachnoid hemorrhage, unspecified: Secondary | ICD-10-CM | POA: Diagnosis not present

## 2015-02-11 DIAGNOSIS — I10 Essential (primary) hypertension: Secondary | ICD-10-CM | POA: Diagnosis not present

## 2015-02-11 DIAGNOSIS — C20 Malignant neoplasm of rectum: Secondary | ICD-10-CM | POA: Diagnosis not present

## 2015-02-11 DIAGNOSIS — N179 Acute kidney failure, unspecified: Secondary | ICD-10-CM | POA: Diagnosis not present

## 2015-02-12 DIAGNOSIS — I2510 Atherosclerotic heart disease of native coronary artery without angina pectoris: Secondary | ICD-10-CM | POA: Diagnosis not present

## 2015-02-12 DIAGNOSIS — K8020 Calculus of gallbladder without cholecystitis without obstruction: Secondary | ICD-10-CM | POA: Diagnosis not present

## 2015-02-12 DIAGNOSIS — Z95810 Presence of automatic (implantable) cardiac defibrillator: Secondary | ICD-10-CM | POA: Diagnosis not present

## 2015-02-12 DIAGNOSIS — I482 Chronic atrial fibrillation: Secondary | ICD-10-CM | POA: Diagnosis not present

## 2015-02-12 DIAGNOSIS — Z8546 Personal history of malignant neoplasm of prostate: Secondary | ICD-10-CM | POA: Diagnosis not present

## 2015-02-12 DIAGNOSIS — I5020 Unspecified systolic (congestive) heart failure: Secondary | ICD-10-CM | POA: Diagnosis not present

## 2015-02-12 DIAGNOSIS — Z85048 Personal history of other malignant neoplasm of rectum, rectosigmoid junction, and anus: Secondary | ICD-10-CM | POA: Diagnosis not present

## 2015-02-12 DIAGNOSIS — R634 Abnormal weight loss: Secondary | ICD-10-CM | POA: Diagnosis not present

## 2015-02-12 DIAGNOSIS — I10 Essential (primary) hypertension: Secondary | ICD-10-CM | POA: Diagnosis not present

## 2015-02-12 DIAGNOSIS — E119 Type 2 diabetes mellitus without complications: Secondary | ICD-10-CM | POA: Diagnosis not present

## 2015-02-12 DIAGNOSIS — I255 Ischemic cardiomyopathy: Secondary | ICD-10-CM | POA: Diagnosis not present

## 2015-02-13 DIAGNOSIS — N183 Chronic kidney disease, stage 3 (moderate): Secondary | ICD-10-CM | POA: Diagnosis not present

## 2015-02-13 DIAGNOSIS — S066X0D Traumatic subarachnoid hemorrhage without loss of consciousness, subsequent encounter: Secondary | ICD-10-CM | POA: Diagnosis not present

## 2015-02-13 DIAGNOSIS — I2510 Atherosclerotic heart disease of native coronary artery without angina pectoris: Secondary | ICD-10-CM | POA: Diagnosis not present

## 2015-02-13 DIAGNOSIS — W19XXXD Unspecified fall, subsequent encounter: Secondary | ICD-10-CM | POA: Diagnosis not present

## 2015-02-13 DIAGNOSIS — I480 Paroxysmal atrial fibrillation: Secondary | ICD-10-CM | POA: Diagnosis not present

## 2015-02-13 DIAGNOSIS — I5022 Chronic systolic (congestive) heart failure: Secondary | ICD-10-CM | POA: Diagnosis not present

## 2015-02-14 ENCOUNTER — Encounter: Payer: Medicare Other | Admitting: Internal Medicine

## 2015-02-14 ENCOUNTER — Encounter: Payer: Self-pay | Admitting: Cardiology

## 2015-02-17 ENCOUNTER — Encounter (HOSPITAL_COMMUNITY)
Admission: RE | Admit: 2015-02-17 | Discharge: 2015-02-17 | Disposition: A | Discharge status: Home / Self Care | Payer: Medicare Other | Source: Ambulatory Visit | Attending: Cardiology | Admitting: Cardiology

## 2015-02-17 ENCOUNTER — Encounter (HOSPITAL_COMMUNITY): Payer: Self-pay

## 2015-02-17 ENCOUNTER — Ambulatory Visit (HOSPITAL_COMMUNITY)
Admission: RE | Admit: 2015-02-17 | Discharge: 2015-02-17 | Disposition: A | Discharge status: Home / Self Care | Payer: Medicare Other | Source: Ambulatory Visit | Attending: Cardiology | Admitting: Cardiology

## 2015-02-17 DIAGNOSIS — I4891 Unspecified atrial fibrillation: Secondary | ICD-10-CM | POA: Diagnosis not present

## 2015-02-17 DIAGNOSIS — R079 Chest pain, unspecified: Secondary | ICD-10-CM | POA: Insufficient documentation

## 2015-02-17 DIAGNOSIS — R072 Precordial pain: Secondary | ICD-10-CM | POA: Diagnosis not present

## 2015-02-17 DIAGNOSIS — I2510 Atherosclerotic heart disease of native coronary artery without angina pectoris: Secondary | ICD-10-CM | POA: Insufficient documentation

## 2015-02-17 MED ORDER — SODIUM CHLORIDE 0.9 % IJ SOLN
INTRAMUSCULAR | Status: AC
  Administered 2015-02-17: 10 mL via INTRAVENOUS
  Filled 2015-02-17: qty 3

## 2015-02-17 MED ORDER — TECHNETIUM TC 99M SESTAMIBI - CARDIOLITE
30.0000 | Freq: Once | INTRAVENOUS | Status: AC | PRN
  Administered 2015-02-17: 11:00:00 27 via INTRAVENOUS

## 2015-02-17 MED ORDER — REGADENOSON 0.4 MG/5ML IV SOLN
INTRAVENOUS | Status: AC
  Administered 2015-02-17: 0.4 mg via INTRAVENOUS
  Filled 2015-02-17: qty 5

## 2015-02-17 MED ORDER — TECHNETIUM TC 99M SESTAMIBI GENERIC - CARDIOLITE
10.0000 | Freq: Once | INTRAVENOUS | Status: AC | PRN
  Administered 2015-02-17: 10 via INTRAVENOUS

## 2015-02-17 MED ORDER — REGADENOSON 0.4 MG/5ML IV SOLN
0.4000 mg | Freq: Once | INTRAVENOUS | Status: AC | PRN
  Administered 2015-02-17: 0.4 mg via INTRAVENOUS

## 2015-02-17 MED ORDER — SODIUM CHLORIDE 0.9 % IJ SOLN
10.0000 mL | INTRAMUSCULAR | Status: DC | PRN
  Administered 2015-02-17: 10 mL via INTRAVENOUS
  Filled 2015-02-17: qty 10

## 2015-02-17 NOTE — Progress Notes (Signed)
Stress Lab Nurses Notes - [name]  [name] 02/17/2015 Reason for doing test: CAD,  AFib, & chest pain Type of test: Wille Glaser Nurse performing test: Gerrit Halls, RN Nuclear Medicine Tech: Redmond Baseman Echo Tech: Not Applicable MD performing test: Branch/K.Purcell Nails NP Family MD: Gerarda Fraction Test explained and consent signed: Yes.   IV started: Saline lock flushed, No redness or edema and Saline lock started in radiology Symptoms: nausea Treatment/Intervention: None Reason test stopped: protocol completed After recovery IV was: Discontinued via X-ray tech and No redness or edema Patient to return to Nuc. Med at : 11:30 Patient discharged: Home Patient's Condition upon discharge was: stable Comments: During test BP 88/60 & HR 100 .  Recovery BP 104/65 & HR 96.  Symptoms resolved in recovery. [name] T

## 2015-02-21 DIAGNOSIS — N183 Chronic kidney disease, stage 3 (moderate): Secondary | ICD-10-CM | POA: Diagnosis not present

## 2015-02-21 DIAGNOSIS — W19XXXD Unspecified fall, subsequent encounter: Secondary | ICD-10-CM | POA: Diagnosis not present

## 2015-02-21 DIAGNOSIS — S066X0D Traumatic subarachnoid hemorrhage without loss of consciousness, subsequent encounter: Secondary | ICD-10-CM | POA: Diagnosis not present

## 2015-02-21 DIAGNOSIS — I5022 Chronic systolic (congestive) heart failure: Secondary | ICD-10-CM | POA: Diagnosis not present

## 2015-02-21 DIAGNOSIS — I2510 Atherosclerotic heart disease of native coronary artery without angina pectoris: Secondary | ICD-10-CM | POA: Diagnosis not present

## 2015-02-21 DIAGNOSIS — I480 Paroxysmal atrial fibrillation: Secondary | ICD-10-CM | POA: Diagnosis not present

## 2015-02-23 ENCOUNTER — Other Ambulatory Visit: Payer: Self-pay

## 2015-02-23 ENCOUNTER — Inpatient Hospital Stay (HOSPITAL_COMMUNITY)
Admission: EM | Admit: 2015-02-23 | Discharge: 2015-02-27 | DRG: 286 | Disposition: A | Discharge status: Home / Self Care | Payer: Medicare Other | Attending: Cardiology | Admitting: Cardiology

## 2015-02-23 ENCOUNTER — Encounter (HOSPITAL_COMMUNITY): Payer: Self-pay | Admitting: Cardiology

## 2015-02-23 ENCOUNTER — Emergency Department (HOSPITAL_COMMUNITY): Payer: Medicare Other

## 2015-02-23 DIAGNOSIS — R05 Cough: Secondary | ICD-10-CM | POA: Diagnosis not present

## 2015-02-23 DIAGNOSIS — Z85048 Personal history of other malignant neoplasm of rectum, rectosigmoid junction, and anus: Secondary | ICD-10-CM | POA: Diagnosis present

## 2015-02-23 DIAGNOSIS — R570 Cardiogenic shock: Secondary | ICD-10-CM | POA: Diagnosis present

## 2015-02-23 DIAGNOSIS — I509 Heart failure, unspecified: Secondary | ICD-10-CM

## 2015-02-23 DIAGNOSIS — D61818 Other pancytopenia: Secondary | ICD-10-CM | POA: Diagnosis present

## 2015-02-23 DIAGNOSIS — K219 Gastro-esophageal reflux disease without esophagitis: Secondary | ICD-10-CM | POA: Diagnosis present

## 2015-02-23 DIAGNOSIS — G4733 Obstructive sleep apnea (adult) (pediatric): Secondary | ICD-10-CM | POA: Diagnosis not present

## 2015-02-23 DIAGNOSIS — Z9181 History of falling: Secondary | ICD-10-CM | POA: Diagnosis not present

## 2015-02-23 DIAGNOSIS — N184 Chronic kidney disease, stage 4 (severe): Secondary | ICD-10-CM | POA: Diagnosis not present

## 2015-02-23 DIAGNOSIS — Z95810 Presence of automatic (implantable) cardiac defibrillator: Secondary | ICD-10-CM | POA: Diagnosis not present

## 2015-02-23 DIAGNOSIS — R0602 Shortness of breath: Secondary | ICD-10-CM | POA: Diagnosis not present

## 2015-02-23 DIAGNOSIS — Z955 Presence of coronary angioplasty implant and graft: Secondary | ICD-10-CM | POA: Diagnosis not present

## 2015-02-23 DIAGNOSIS — I480 Paroxysmal atrial fibrillation: Secondary | ICD-10-CM | POA: Diagnosis present

## 2015-02-23 DIAGNOSIS — I959 Hypotension, unspecified: Secondary | ICD-10-CM

## 2015-02-23 DIAGNOSIS — I4901 Ventricular fibrillation: Secondary | ICD-10-CM | POA: Diagnosis not present

## 2015-02-23 DIAGNOSIS — I482 Chronic atrial fibrillation, unspecified: Secondary | ICD-10-CM | POA: Diagnosis present

## 2015-02-23 DIAGNOSIS — Z9989 Dependence on other enabling machines and devices: Secondary | ICD-10-CM

## 2015-02-23 DIAGNOSIS — I5023 Acute on chronic systolic (congestive) heart failure: Secondary | ICD-10-CM | POA: Diagnosis not present

## 2015-02-23 DIAGNOSIS — I129 Hypertensive chronic kidney disease with stage 1 through stage 4 chronic kidney disease, or unspecified chronic kidney disease: Secondary | ICD-10-CM | POA: Diagnosis present

## 2015-02-23 DIAGNOSIS — Z8546 Personal history of malignant neoplasm of prostate: Secondary | ICD-10-CM

## 2015-02-23 DIAGNOSIS — E785 Hyperlipidemia, unspecified: Secondary | ICD-10-CM | POA: Diagnosis present

## 2015-02-23 DIAGNOSIS — I2510 Atherosclerotic heart disease of native coronary artery without angina pectoris: Secondary | ICD-10-CM | POA: Diagnosis present

## 2015-02-23 DIAGNOSIS — E669 Obesity, unspecified: Secondary | ICD-10-CM | POA: Diagnosis present

## 2015-02-23 DIAGNOSIS — Z794 Long term (current) use of insulin: Secondary | ICD-10-CM | POA: Diagnosis not present

## 2015-02-23 DIAGNOSIS — I5043 Acute on chronic combined systolic (congestive) and diastolic (congestive) heart failure: Secondary | ICD-10-CM | POA: Diagnosis not present

## 2015-02-23 DIAGNOSIS — IMO0002 Reserved for concepts with insufficient information to code with codable children: Secondary | ICD-10-CM | POA: Diagnosis present

## 2015-02-23 DIAGNOSIS — Z6830 Body mass index (BMI) 30.0-30.9, adult: Secondary | ICD-10-CM | POA: Diagnosis not present

## 2015-02-23 DIAGNOSIS — I252 Old myocardial infarction: Secondary | ICD-10-CM | POA: Diagnosis not present

## 2015-02-23 DIAGNOSIS — E876 Hypokalemia: Secondary | ICD-10-CM | POA: Diagnosis present

## 2015-02-23 DIAGNOSIS — Z8679 Personal history of other diseases of the circulatory system: Secondary | ICD-10-CM | POA: Diagnosis present

## 2015-02-23 DIAGNOSIS — Z8673 Personal history of transient ischemic attack (TIA), and cerebral infarction without residual deficits: Secondary | ICD-10-CM | POA: Diagnosis not present

## 2015-02-23 DIAGNOSIS — I5022 Chronic systolic (congestive) heart failure: Secondary | ICD-10-CM | POA: Diagnosis present

## 2015-02-23 DIAGNOSIS — R0682 Tachypnea, not elsewhere classified: Secondary | ICD-10-CM | POA: Diagnosis not present

## 2015-02-23 DIAGNOSIS — Z0389 Encounter for observation for other suspected diseases and conditions ruled out: Secondary | ICD-10-CM | POA: Diagnosis not present

## 2015-02-23 DIAGNOSIS — I951 Orthostatic hypotension: Secondary | ICD-10-CM | POA: Diagnosis not present

## 2015-02-23 DIAGNOSIS — N289 Disorder of kidney and ureter, unspecified: Secondary | ICD-10-CM | POA: Diagnosis present

## 2015-02-23 DIAGNOSIS — E1165 Type 2 diabetes mellitus with hyperglycemia: Secondary | ICD-10-CM | POA: Diagnosis present

## 2015-02-23 DIAGNOSIS — Z933 Colostomy status: Secondary | ICD-10-CM

## 2015-02-23 DIAGNOSIS — E1129 Type 2 diabetes mellitus with other diabetic kidney complication: Secondary | ICD-10-CM | POA: Diagnosis not present

## 2015-02-23 DIAGNOSIS — E871 Hypo-osmolality and hyponatremia: Secondary | ICD-10-CM | POA: Diagnosis present

## 2015-02-23 DIAGNOSIS — Z7901 Long term (current) use of anticoagulants: Secondary | ICD-10-CM | POA: Diagnosis not present

## 2015-02-23 DIAGNOSIS — N179 Acute kidney failure, unspecified: Secondary | ICD-10-CM | POA: Diagnosis not present

## 2015-02-23 DIAGNOSIS — I255 Ischemic cardiomyopathy: Secondary | ICD-10-CM | POA: Diagnosis present

## 2015-02-23 HISTORY — DX: Chronic kidney disease, stage 4 (severe): N18.4

## 2015-02-23 HISTORY — DX: Type 2 diabetes mellitus with hyperglycemia: E11.65

## 2015-02-23 HISTORY — DX: Ischemic cardiomyopathy: I25.5

## 2015-02-23 HISTORY — DX: Obesity, unspecified: E66.9

## 2015-02-23 HISTORY — DX: Type 2 diabetes mellitus with other diabetic kidney complication: E11.29

## 2015-02-23 HISTORY — DX: Reserved for concepts with insufficient information to code with codable children: IMO0002

## 2015-02-23 LAB — CBC WITH DIFFERENTIAL/PLATELET
Basophils Absolute: 0 10*3/uL (ref 0.0–0.1)
Basophils Absolute: 0 10*3/uL (ref 0.0–0.1)
Basophils Relative: 0 % (ref 0–1)
Basophils Relative: 0 % (ref 0–1)
EOS ABS: 0 10*3/uL (ref 0.0–0.7)
EOS PCT: 0 % (ref 0–5)
Eosinophils Absolute: 0 10*3/uL (ref 0.0–0.7)
Eosinophils Relative: 0 % (ref 0–5)
HCT: 26.5 % — ABNORMAL LOW (ref 39.0–52.0)
HCT: 29.6 % — ABNORMAL LOW (ref 39.0–52.0)
HEMOGLOBIN: 8.7 g/dL — AB (ref 13.0–17.0)
HEMOGLOBIN: 9.5 g/dL — AB (ref 13.0–17.0)
LYMPHS ABS: 0.2 10*3/uL — AB (ref 0.7–4.0)
Lymphocytes Relative: 2 % — ABNORMAL LOW (ref 12–46)
Lymphocytes Relative: 11 % — ABNORMAL LOW (ref 12–46)
Lymphs Abs: 0.3 10*3/uL — ABNORMAL LOW (ref 0.7–4.0)
MCH: 30.5 pg (ref 26.0–34.0)
MCH: 30.4 pg (ref 26.0–34.0)
MCHC: 32.8 g/dL (ref 30.0–36.0)
MCHC: 32.1 g/dL (ref 30.0–36.0)
MCV: 93 fL (ref 78.0–100.0)
MCV: 94.9 fL (ref 78.0–100.0)
MONOS PCT: 4 % (ref 3–12)
MONOS PCT: 1 % — AB (ref 3–12)
Monocytes Absolute: 0.4 10*3/uL (ref 0.1–1.0)
Monocytes Absolute: 0 10*3/uL — ABNORMAL LOW (ref 0.1–1.0)
NEUTROS ABS: 9.3 10*3/uL — AB (ref 1.7–7.7)
NEUTROS ABS: 2.1 10*3/uL (ref 1.7–7.7)
Neutrophils Relative %: 94 % — ABNORMAL HIGH (ref 43–77)
Neutrophils Relative %: 88 % — ABNORMAL HIGH (ref 43–77)
Platelets: 123 10*3/uL — ABNORMAL LOW (ref 150–400)
Platelets: 148 10*3/uL — ABNORMAL LOW (ref 150–400)
RBC: 2.85 MIL/uL — ABNORMAL LOW (ref 4.22–5.81)
RBC: 3.12 MIL/uL — AB (ref 4.22–5.81)
RDW: 17 % — ABNORMAL HIGH (ref 11.5–15.5)
RDW: 17.5 % — ABNORMAL HIGH (ref 11.5–15.5)
WBC MORPHOLOGY: INCREASED
WBC: 9.9 10*3/uL (ref 4.0–10.5)
WBC: 2.4 10*3/uL — ABNORMAL LOW (ref 4.0–10.5)

## 2015-02-23 LAB — DIGOXIN LEVEL: Digoxin Level: 0.6 ng/mL — ABNORMAL LOW (ref 0.8–2.0)

## 2015-02-23 LAB — COMPREHENSIVE METABOLIC PANEL
ALK PHOS: 84 U/L (ref 39–117)
ALT: 12 U/L (ref 0–53)
ALT: <5 U/L (ref 0–53)
ANION GAP: 18 — AB (ref 5–15)
AST: 21 U/L (ref 0–37)
AST: 38 U/L — ABNORMAL HIGH (ref 0–37)
Albumin: 2.6 g/dL — ABNORMAL LOW (ref 3.5–5.2)
Albumin: 2.7 g/dL — ABNORMAL LOW (ref 3.5–5.2)
Alkaline Phosphatase: 95 U/L (ref 39–117)
Anion gap: 12 (ref 5–15)
BILIRUBIN TOTAL: 1.7 mg/dL — AB (ref 0.3–1.2)
BUN: 29 mg/dL — AB (ref 6–23)
BUN: 31 mg/dL — AB (ref 6–23)
CALCIUM: 8.6 mg/dL (ref 8.4–10.5)
CO2: 22 mmol/L (ref 19–32)
CO2: 17 mmol/L — AB (ref 19–32)
Calcium: 8.8 mg/dL (ref 8.4–10.5)
Chloride: 98 mmol/L (ref 96–112)
Chloride: 95 mmol/L — ABNORMAL LOW (ref 96–112)
Creatinine, Ser: 2.96 mg/dL — ABNORMAL HIGH (ref 0.50–1.35)
Creatinine, Ser: 3.29 mg/dL — ABNORMAL HIGH (ref 0.50–1.35)
GFR calc Af Amer: 22 mL/min — ABNORMAL LOW (ref >90)
GFR calc non Af Amer: 22 mL/min — ABNORMAL LOW (ref >90)
GFR calc non Af Amer: 19 mL/min — ABNORMAL LOW (ref >90)
GFR, EST AFRICAN AMERICAN: 25 mL/min — AB (ref >90)
GLUCOSE: 357 mg/dL — AB (ref 70–99)
GLUCOSE: 395 mg/dL — AB (ref 70–99)
Potassium: 3.4 mmol/L — ABNORMAL LOW (ref 3.5–5.1)
Potassium: 4 mmol/L (ref 3.5–5.1)
SODIUM: 132 mmol/L — AB (ref 135–145)
Sodium: 130 mmol/L — ABNORMAL LOW (ref 135–145)
Total Bilirubin: 1.9 mg/dL — ABNORMAL HIGH (ref 0.3–1.2)
Total Protein: 6.8 g/dL (ref 6.0–8.3)
Total Protein: 7.3 g/dL (ref 6.0–8.3)

## 2015-02-23 LAB — URINALYSIS, ROUTINE W REFLEX MICROSCOPIC
Bilirubin Urine: NEGATIVE
GLUCOSE, UA: 500 mg/dL — AB
HGB URINE DIPSTICK: NEGATIVE
KETONES UR: NEGATIVE mg/dL
Leukocytes, UA: NEGATIVE
Nitrite: NEGATIVE
PH: 5.5 (ref 5.0–8.0)
SPECIFIC GRAVITY, URINE: 1.02 (ref 1.005–1.030)
Urobilinogen, UA: 0.2 mg/dL (ref 0.0–1.0)

## 2015-02-23 LAB — URINE MICROSCOPIC-ADD ON

## 2015-02-23 LAB — PHOSPHORUS: Phosphorus: 4.3 mg/dL (ref 2.3–4.6)

## 2015-02-23 LAB — MRSA PCR SCREENING: MRSA by PCR: NEGATIVE

## 2015-02-23 LAB — TROPONIN I
TROPONIN I: 0.03 ng/mL (ref <0.031)
Troponin I: 0.03 ng/mL (ref <0.031)

## 2015-02-23 LAB — CBG MONITORING, ED: Glucose-Capillary: 310 mg/dL — ABNORMAL HIGH (ref 70–99)

## 2015-02-23 LAB — PROTIME-INR
INR: 1.85 — ABNORMAL HIGH (ref 0.00–1.49)
PROTHROMBIN TIME: 21.5 s — AB (ref 11.6–15.2)

## 2015-02-23 LAB — MAGNESIUM: Magnesium: 1.8 mg/dL (ref 1.5–2.5)

## 2015-02-23 LAB — BRAIN NATRIURETIC PEPTIDE: B Natriuretic Peptide: 568 pg/mL — ABNORMAL HIGH (ref 0.0–100.0)

## 2015-02-23 LAB — LACTIC ACID, PLASMA: Lactic Acid, Venous: 7.3 mmol/L (ref 0.5–2.0)

## 2015-02-23 MED ORDER — METOPROLOL SUCCINATE ER 25 MG PO TB24
25.0000 mg | ORAL_TABLET | Freq: Two times a day (BID) | ORAL | Status: DC
  Administered 2015-02-25 – 2015-02-27 (×5): 25 mg via ORAL
  Filled 2015-02-23 (×8): qty 1

## 2015-02-23 MED ORDER — ACETAMINOPHEN 325 MG PO TABS: 650.0000 mg | ORAL_TABLET | ORAL | Status: DC | PRN

## 2015-02-23 MED ORDER — DIGOXIN 125 MCG PO TABS
125.0000 ug | ORAL_TABLET | ORAL | Status: DC
  Administered 2015-02-25 – 2015-02-27 (×2): 125 ug via ORAL
  Filled 2015-02-23 (×3): qty 1

## 2015-02-23 MED ORDER — FUROSEMIDE 10 MG/ML IJ SOLN
60.0000 mg | Freq: Once | INTRAMUSCULAR | Status: AC
  Administered 2015-02-23: 60 mg via INTRAVENOUS
  Filled 2015-02-23: qty 6

## 2015-02-23 MED ORDER — INSULIN GLARGINE 100 UNIT/ML ~~LOC~~ SOLN
5.0000 [IU] | Freq: Every day | SUBCUTANEOUS | Status: DC
  Administered 2015-02-24 – 2015-02-26 (×4): 5 [IU] via SUBCUTANEOUS
  Filled 2015-02-23 (×6): qty 0.05

## 2015-02-23 MED ORDER — INSULIN ASPART 100 UNIT/ML ~~LOC~~ SOLN
0.0000 [IU] | Freq: Three times a day (TID) | SUBCUTANEOUS | Status: DC
  Administered 2015-02-23: 15 [IU] via SUBCUTANEOUS
  Administered 2015-02-24: 8 [IU] via SUBCUTANEOUS
  Administered 2015-02-24: 3 [IU] via SUBCUTANEOUS
  Administered 2015-02-25: 11 [IU] via SUBCUTANEOUS
  Administered 2015-02-25 – 2015-02-26 (×3): 5 [IU] via SUBCUTANEOUS
  Administered 2015-02-26: 8 [IU] via SUBCUTANEOUS
  Administered 2015-02-26 – 2015-02-27 (×3): 3 [IU] via SUBCUTANEOUS

## 2015-02-23 MED ORDER — CETYLPYRIDINIUM CHLORIDE 0.05 % MT LIQD
7.0000 mL | Freq: Two times a day (BID) | OROMUCOSAL | Status: DC
  Administered 2015-02-23 – 2015-02-27 (×7): 7 mL via OROMUCOSAL

## 2015-02-23 MED ORDER — FLUTICASONE PROPIONATE 50 MCG/ACT NA SUSP: 1.0000 | Freq: Every day | NASAL | Status: DC | PRN

## 2015-02-23 MED ORDER — PANTOPRAZOLE SODIUM 40 MG PO TBEC
40.0000 mg | DELAYED_RELEASE_TABLET | Freq: Every day | ORAL | Status: DC
  Administered 2015-02-23 – 2015-02-27 (×5): 40 mg via ORAL
  Filled 2015-02-23 (×5): qty 1

## 2015-02-23 MED ORDER — APIXABAN 5 MG PO TABS
5.0000 mg | ORAL_TABLET | Freq: Two times a day (BID) | ORAL | Status: DC
  Administered 2015-02-24 (×2): 5 mg via ORAL
  Filled 2015-02-23 (×3): qty 1

## 2015-02-23 MED ORDER — MAGNESIUM SULFATE 2 GM/50ML IV SOLN
2.0000 g | INTRAVENOUS | Status: AC
  Administered 2015-02-23: 2 g via INTRAVENOUS

## 2015-02-23 MED ORDER — NITROGLYCERIN 0.4 MG/SPRAY TL SOLN
1.0000 | Status: DC | PRN
  Filled 2015-02-23: qty 4.9

## 2015-02-23 MED ORDER — AMIODARONE HCL 200 MG PO TABS
200.0000 mg | ORAL_TABLET | Freq: Every day | ORAL | Status: DC
  Administered 2015-02-24 – 2015-02-26 (×3): 200 mg via ORAL
  Filled 2015-02-23 (×5): qty 1

## 2015-02-23 MED ORDER — ALBUTEROL SULFATE (2.5 MG/3ML) 0.083% IN NEBU: 2.5000 mg | INHALATION_SOLUTION | RESPIRATORY_TRACT | Status: DC | PRN

## 2015-02-23 MED ORDER — DIGOXIN 125 MCG PO TABS
0.1250 mg | ORAL_TABLET | Freq: Once | ORAL | Status: AC
  Administered 2015-02-23: 0.125 mg via ORAL
  Filled 2015-02-23: qty 1

## 2015-02-23 MED ORDER — POTASSIUM CHLORIDE 10 MEQ/100ML IV SOLN
10.0000 meq | INTRAVENOUS | Status: DC
Start: 2015-02-23 — End: 2015-02-23
  Administered 2015-02-23: 10 meq via INTRAVENOUS

## 2015-02-23 MED ORDER — ONDANSETRON HCL 4 MG/2ML IJ SOLN: 4.0000 mg | Freq: Four times a day (QID) | INTRAMUSCULAR | Status: DC | PRN

## 2015-02-23 MED ORDER — SODIUM CHLORIDE 0.9 % IV SOLN
250.0000 mL | INTRAVENOUS | Status: DC | PRN
  Administered 2015-02-24: 250 mL via INTRAVENOUS

## 2015-02-23 MED ORDER — ISOSORBIDE DINITRATE 5 MG PO TABS
15.0000 mg | ORAL_TABLET | Freq: Two times a day (BID) | ORAL | Status: DC
  Administered 2015-02-25 – 2015-02-27 (×5): 15 mg via ORAL
  Filled 2015-02-23 (×8): qty 1

## 2015-02-23 MED ORDER — INSULIN ASPART 100 UNIT/ML ~~LOC~~ SOLN
0.0000 [IU] | Freq: Every day | SUBCUTANEOUS | Status: DC
  Administered 2015-02-23: 4 [IU] via SUBCUTANEOUS
  Administered 2015-02-24: 2 [IU] via SUBCUTANEOUS
  Administered 2015-02-25: 3 [IU] via SUBCUTANEOUS

## 2015-02-23 MED ORDER — POTASSIUM CHLORIDE 10 MEQ/100ML IV SOLN
INTRAVENOUS | Status: AC
  Administered 2015-02-23: 10 meq
  Filled 2015-02-23: qty 400

## 2015-02-23 MED ORDER — SODIUM CHLORIDE 0.9 % IJ SOLN: 3.0000 mL | INTRAMUSCULAR | Status: DC | PRN

## 2015-02-23 MED ORDER — ROSUVASTATIN CALCIUM 40 MG PO TABS
40.0000 mg | ORAL_TABLET | Freq: Every day | ORAL | Status: DC
  Administered 2015-02-24 – 2015-02-26 (×4): 40 mg via ORAL
  Filled 2015-02-23 (×5): qty 1

## 2015-02-23 MED ORDER — SODIUM CHLORIDE 0.9 % IJ SOLN
3.0000 mL | Freq: Two times a day (BID) | INTRAMUSCULAR | Status: DC
  Administered 2015-02-24: 3 mL via INTRAVENOUS

## 2015-02-23 NOTE — ED Provider Notes (Signed)
CSN: [id number]     Arrival date & time 02/23/15  8295 History  This chart was scribed for Carmin Muskrat, MD by Stephania Fragmin, ED Scribe. This patient was seen in room APA06/APA06 and the patient's care was started at 10:05 AM.    Chief Complaint  Patient presents with  . Shortness of Breath   The history is provided by the patient. No language interpreter was used.     HPI Comments: [name] is a 59 y.o. 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 He also complains of right sided abdominal pain that began 2 days ago, and a subjective fever and chills that began last night; as well as frequent coughing and nausea. Patient states he was last normal before these symptoms began. He denies a history of COPD or emphysema. He also denies chest pain.   Past Medical History  Diagnosis Date  . Essential hypertension, benign   . Type 2 diabetes mellitus   . CAD (coronary artery disease)     a. BMS to LAD 2001 at [facility] b. PTCA/atherectomy ramus and BMS to LAD 2009  . Chronic systolic heart failure     a. 2D ECHO: 03/01/2014 EF 15-20%, severe, diffuse hypsokinesis. Mod LV dilation. Mod LVH, indeterminate diastolic dysfxn (poor study), high vent filling pressures, abnormal septal wall motion and dyssynergy c/w RV pacing, mild MR, mod LA dilation, RV systolic function mod reduced, mild TR, PA pressure 42.   Marland Kitchen GERD (gastroesophageal reflux disease)   . Cardiomyopathy, ischemic     a. BIV ICD St. Jude, LVEF 23%  . Paroxysmal atrial fibrillation     a. on amiodarone, digoxin and Eliquis  . Adenocarcinoma of rectum     a. 2008  . Prostate cancer     a. s/p seed implants with chemo and radiation  . 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 St Jude  . TIA  (transient ischemic attack)   . OSA on CPAP   . HLD (hyperlipidemia)   . Orthostatic hypotension   . Dizziness     a. chronic. Admission for this 07/18/2014  . History of falling July 2015    due to dizziness from medications  . CHF (congestive heart failure)   . Renal insufficiency    Past Surgical History  Procedure Laterality Date  . Internal defibrillator and pacemaker  2010    x2 St. Jude device  . Abdominal and perineal resection of rectum with total mesorectal excision  10/04/2007  . Colonoscopy      05/17/2007. IMPRESSION: Semilunar, apple-core neoplasm low in the rectum (palpable on digital rectal exam) beginning at 5 cm and corkscrewing up 5 cm in length. This was a low rectal lesion consistent with colorectal carcinoma. It was biosied multiple times. The upstream colon all the way to the cecum appeared normal. Recommendations: Followup on path. Surgical Consultation   . Colonoscopy  09/14/2011    Dr. Gala Romney: via colostomy, Single pedunculated benign inflammatory polyp. Due for surveillance Oct 2015  . Colostomy    . Colonoscopy N/A 07/02/2014    Procedure: COLONOSCOPY;  Surgeon: Daneil Dolin, MD;  Location: [facility];  Service: Endoscopy;  Laterality: N/A;  7:30 / COLONOSCOPY THRU COLOSTOMY  . Esophagogastroduodenoscopy N/A 07/02/2014    Procedure:  ESOPHAGOGASTRODUODENOSCOPY (EGD);  Surgeon: Daneil Dolin, MD;  Location: [facility];  Service: Endoscopy;  Laterality: N/A;  7:30  . Savory dilation N/A 07/02/2014    Procedure: SAVORY DILATION;  Surgeon: Daneil Dolin, MD;  Location: [facility];  Service: Endoscopy;  Laterality: N/A;  7:30  . Maloney dilation N/A 07/02/2014    Procedure: Venia Minks DILATION;  Surgeon: Daneil Dolin, MD;  Location: [facility];  Service: Endoscopy;  Laterality: N/A;  7:30  . Portacath placement  06/2007    "removed ~ 1 yr later"  . Cardiac catheterization  08/2001  . Coronary angioplasty with stent placement  2001; ~ 2006    "1 + 1"   .  Left heart catheterization with coronary angiogram N/A 07/13/2013    Procedure: LEFT HEART CATHETERIZATION WITH CORONARY ANGIOGRAM;  Surgeon: Lorretta Harp, MD;  Location: [facility] CATH LAB;  Service: Cardiovascular;  Laterality: N/A;  . Colonoscopy N/A 12/11/2014    Dr. Gala Romney via colostomy. Normal. Repeat in 2021.   Marland Kitchen Esophagogastroduodenoscopy N/A 12/11/2014    [facility] EGD   Family History  Problem Relation Age of Onset  . Colon cancer Mother 51  . Colon cancer Sister 54  . Coronary artery disease Father   . Colon cancer Other     2 cousins, succumbed to illness   History  Substance Use Topics  . Smoking status: Never Smoker   . Smokeless tobacco: Never Used  . Alcohol Use: No     Comment: Former user 45 years ago    Review of Systems  Constitutional: Positive for fever and chills.       Per HPI, otherwise negative  HENT:       Per HPI, otherwise negative  Respiratory: Positive for cough and shortness of breath.        Per HPI, otherwise negative  Cardiovascular: Negative for chest pain.       Per HPI, otherwise negative  Gastrointestinal: Positive for nausea and abdominal pain. Negative for vomiting.  Endocrine:       Negative aside from HPI  Genitourinary:       Neg aside from HPI   Musculoskeletal:       Per HPI, otherwise negative  Skin: Negative.   Neurological: Negative for syncope.      Allergies  Review of patient's allergies indicates no known allergies.  Home Medications   Prior to Admission medications   Medication Sig Start Date End Date Taking? Authorizing Provider  amiodarone (PACERONE) 200 MG tablet Take 1 tablet (200 mg total) by mouth daily. 05/23/14  Yes Maryann Mikhail, DO  apixaban (ELIQUIS) 5 MG TABS tablet Take 5 mg by mouth 2 (two) times daily.   Yes Historical Provider, MD  co-enzyme Q-10 50 MG capsule Take 50 mg by mouth every morning.    Yes Historical Provider, MD  digoxin (LANOXIN) 0.125 MG tablet Take 1 tablet by mouth every other day.  10/29/14  Yes Historical Provider, MD  fluticasone (FLONASE) 50 MCG/ACT nasal spray Place 1 spray into both nostrils daily as needed for allergies.  11/21/13  Yes Historical Provider, MD  furosemide (LASIX) 40 MG tablet Take 1 tablet (40 mg total) by mouth daily as needed. 02/10/15  Yes Arnoldo Lenis, MD  HYDROcodone-acetaminophen (NORCO/VICODIN) 5-325 MG per tablet Take 1-2 tablets by mouth every 6 (six) hours as needed. 01/17/15  Yes Orvil Feil, NP  isosorbide dinitrate (ISORDIL) 30 MG tablet Take 15 mg by mouth 2 (two) times  daily.  07/31/14  Yes Historical Provider, MD  meclizine (ANTIVERT) 25 MG tablet Take 25 mg by mouth as needed for dizziness.   Yes Historical Provider, MD  metoprolol succinate (TOPROL-XL) 25 MG 24 hr tablet Take 25 mg by mouth 2 (two) times daily. 01/18/15  Yes Historical Provider, MD  nitroGLYCERIN (NITROLINGUAL) 0.4 MG/SPRAY spray Place 1 spray under the tongue every 5 (five) minutes as needed. angina 07/19/14  Yes Arnoldo Lenis, MD  Omega-3 Fatty Acids (FISH OIL) 1200 MG CAPS Take 1,200 mg by mouth 2 (two) times daily.     Yes Historical Provider, MD  pantoprazole (PROTONIX) 40 MG tablet Take 1 tablet (40 mg total) by mouth daily. 07/19/14  Yes Arnoldo Lenis, MD  rosuvastatin (CRESTOR) 40 MG tablet Take 1 tablet (40 mg total) by mouth at bedtime. 09/27/14  Yes Arnoldo Lenis, MD  acetaminophen (TYLENOL) 500 MG tablet Take 1,000 mg by mouth every 6 (six) hours as needed for moderate pain.    Historical Provider, MD   BP 86/60 mmHg  Pulse 93  Temp(Src) 98.6 F (37 C) (Oral)  Resp 15  Ht 5\' 10"  (1.778 m)  Wt 211 lb (95.709 kg)  BMI 30.28 kg/m2  SpO2 98% Physical Exam  Constitutional: He is oriented to person, place, and time. He appears well-developed. No distress.  HENT:  Head: Normocephalic and atraumatic.  Eyes: Conjunctivae and EOM are normal.  Cardiovascular: Normal rate and regular rhythm.   Pulmonary/Chest: Effort normal. No stridor. No  respiratory distress.  Abdominal: Soft. He exhibits no distension.  Musculoskeletal: He exhibits no edema.  Neurological: He is alert and oriented to person, place, and time.  Skin: Skin is warm and dry.  Psychiatric: He has a normal mood and affect.  Nursing note and vitals reviewed.   ED Course  Procedures (including critical care time)  DIAGNOSTIC STUDIES: Oxygen Saturation is 98% on 2 L O2/min, normal by my interpretation.    COORDINATION OF CARE: 10:10 AM - Discussed treatment plan with pt at bedside which includes several tests, and pt agreed to plan.   Labs Review Labs Reviewed  CBC WITH DIFFERENTIAL/PLATELET - Abnormal; Notable for the following:    RBC 2.85 (*)    Hemoglobin 8.7 (*)    HCT 26.5 (*)    RDW 17.0 (*)    Platelets 123 (*)    Neutrophils Relative % 94 (*)    Lymphocytes Relative 2 (*)    Neutro Abs 9.3 (*)    Lymphs Abs 0.2 (*)    All other components within normal limits  COMPREHENSIVE METABOLIC PANEL - Abnormal; Notable for the following:    Sodium 132 (*)    Potassium 3.4 (*)    Glucose, Bld 357 (*)    BUN 29 (*)    Creatinine, Ser 2.96 (*)    Albumin 2.6 (*)    Total Bilirubin 1.7 (*)    GFR calc non Af Amer 22 (*)    GFR calc Af Amer 25 (*)    All other components within normal limits  BRAIN NATRIURETIC PEPTIDE - Abnormal; Notable for the following:    B Natriuretic Peptide 568.0 (*)    All other components within normal limits  DIGOXIN LEVEL - Abnormal; Notable for the following:    Digoxin Level 0.6 (*)    All other components within normal limits  CBG MONITORING, ED - Abnormal; Notable for the following:    Glucose-Capillary 310 (*)    All other components  within normal limits  TROPONIN I  URINALYSIS, ROUTINE W REFLEX MICROSCOPIC    Imaging Review Dg Chest 2 View  02/23/2015   CLINICAL DATA:  Shortness of breath and cough  EXAM: CHEST  2 VIEW  COMPARISON:  01/10/2015  FINDINGS: Cardiac shadow is enlarged but stable. A defibrillator  is again seen and stable. The lungs are well-aerated without focal infiltrate or sizable effusion. No acute bony abnormality is seen.  IMPRESSION: No active cardiopulmonary disease.   Electronically Signed   By: Inez Catalina M.D.   On: 02/23/2015 10:30   EKG with rate 98, left bundle branch block, grossly unchanged from prior to my abnormal   MDM  Patient presents with dyspnea, fatigue.  Patient also has nausea. Patient has no chest pain either before calling EMS, nor on arrival. Patient has multiple medical issues, including CAD, CHF, hypotension. Here the patient is awake and alert, though he is initially tachycardic, borderline hypotensive. Hypotension seems chronic. Patient has ongoing respiratory difficulty, though this improved with Lasix, supplemental oxygen. Patient's nausea resolved with Zofran. No evidence for new hepatobiliary dysfunction, or acute abdomen. There is evidence for heart failure exacerbation, likely worsened by the patient's subtherapeutic digoxin.  Patient received digoxin supplementation as well. Given the patient's persistent hypotension, he required admission to a stepdown bed.   I personally performed the services described in this documentation, which was scribed in my presence. The recorded information has been reviewed and is accurate.     Carmin Muskrat, MD 02/23/15 1525

## 2015-02-23 NOTE — Progress Notes (Signed)
[facility] [employer] called by other staff at [employer]. Upon arrival into patients room patient noted to be apneic with an abnormal rhythm. Patient bagged immediately. After two minutes patient regain conciousness and was placed back on nasal cannula. Was oriented to self and situation, off on time by one day.

## 2015-02-23 NOTE — Progress Notes (Signed)
[facility] Physician-Brief Progress Note Patient Name: [name] DOB: 1956-02-01 MRN: [id number]   Date of Service  02/23/2015  HPI/Events of Note  New pt eval Comfortably in bed, no distress, no further VF Cards to see Will need repeat k, lyutes Consider svo2    eICU Interventions  Will follow labs from [facility]     Intervention Category Evaluation Type: New Patient Evaluation  Raylene Miyamoto. 02/23/2015, 11:54 PM

## 2015-02-23 NOTE — Progress Notes (Signed)
Pt A&O x4  [facility] crew arrived and assumed care of pt Pt in no obvious or stated distress [facility] assumed care on or about 2210 Family advised and is with pt at time of departure. Report was given to Crew and to [facility] on 2H11 No further

## 2015-02-23 NOTE — H&P (Signed)
History and Physical  [name] WGY:[id number] DOB: 17-Jan-1956 DOA: 02/23/2015  Referring physician: Dr. Vanita Panda PCP: Glo Herring., MD   Chief Complaint: Short of breath  HPI:  59 year old man with complex past medical history including ischemic cardiomyopathy presented with a history of sudden onset of shortness of breath and dizziness. Emergency department he is found to be tachycardic, hypotensive and strong clinical suspicion for heart failure. History with Lasix with improvement referred for admission because of his hypotension and heart failure.  Patient reports he was in relatively good health, approximately one week ago he did eat some country ham and so took Lasix for a few days but seemed to improve after that with decreased swelling. This morning he became acutely short of breath. He had the bathroom and became quite lightheaded, dizzy although he did not pass out. He has had no chest pain and his shortness of breath has improved after Lasix in the emergency department. He has not had any recent dizziness but his history is notable for orthostatic hypotension which has caused falls in the past including traumatic hemorrhage in these areas medications adjusted multiple times. No specific aggravating or alleviating factors. His history is notable for chronic hypotension with systolic blood pressure running around 90. For example when he was discharged August 2015 his blood pressure was 92/60.  In the emergency department SBP 76-98. Normal HR, RR, afebrile. Basic metabolic panel BUN 29, creatinine 2.96, consistent with renal dysfunction seen 01/2015. Total bilirubin was slightly elevated. BNP elevated at 568. Hemoglobin slightly lower at 8.7, history of chronic anemia. No lactic acid ordered. Chest x-ray no acute disease. No EKG available for review.  Review of Systems:  Negative for fever, visual changes, sore throat, rash, new muscle aches, chest pain, dysuria, bleeding,  n/v/abdominal pain.  Past Medical History  Diagnosis Date  . Essential hypertension, benign   . Type 2 diabetes mellitus   . CAD (coronary artery disease)     a. BMS to LAD 2001 at [facility] b. PTCA/atherectomy ramus and BMS to LAD 2009  . Chronic systolic heart failure     a. 2D ECHO: 03/01/2014 EF 15-20%, severe, diffuse hypsokinesis. Mod LV dilation. Mod LVH, indeterminate diastolic dysfxn (poor study), high vent filling pressures, abnormal septal wall motion and dyssynergy c/w RV pacing, mild MR, mod LA dilation, RV systolic function mod reduced, mild TR, PA pressure 42.   Marland Kitchen GERD (gastroesophageal reflux disease)   . Cardiomyopathy, ischemic     a. BIV ICD St. Jude, LVEF 23%  . Paroxysmal atrial fibrillation     a. on amiodarone, digoxin and Eliquis  . Adenocarcinoma of rectum     a. 2008  . Prostate cancer     a. s/p seed implants with chemo and radiation  . 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 St Jude  . TIA (transient ischemic attack)   . OSA on CPAP   . HLD (hyperlipidemia)   . Orthostatic hypotension   . Dizziness     a. chronic. Admission for this 07/18/2014  . History of falling July 2015    due to dizziness from medications  . CHF (congestive heart failure)   . Renal insufficiency     Past Surgical History  Procedure Laterality Date  . Internal defibrillator and pacemaker  2010    x2 St. Jude device  . Abdominal and perineal resection of rectum with total mesorectal excision  10/04/2007  . Colonoscopy      05/17/2007.  IMPRESSION: Semilunar, apple-core neoplasm low in the rectum (palpable on digital rectal exam) beginning at 5 cm and corkscrewing up 5 cm in length. This was a low rectal lesion consistent with colorectal carcinoma. It was biosied multiple times. The upstream colon all the way to the cecum appeared normal. Recommendations: Followup on path. Surgical Consultation   . Colonoscopy  09/14/2011    Dr. Gala Romney: via colostomy, Single  pedunculated benign inflammatory polyp. Due for surveillance Oct 2015  . Colostomy    . Colonoscopy N/A 07/02/2014    Procedure: COLONOSCOPY;  Surgeon: Daneil Dolin, MD;  [facility];  Service: Endoscopy;  Laterality: N/A;  7:30 / COLONOSCOPY THRU COLOSTOMY  . Esophagogastroduodenoscopy N/A 07/02/2014    Procedure: ESOPHAGOGASTRODUODENOSCOPY (EGD);  Surgeon: Daneil Dolin, MD;  [facility]: [facility];  Service: Endoscopy;  Laterality: N/A;  7:30  . Savory dilation N/A 07/02/2014    Procedure: SAVORY DILATION;  Surgeon: Daneil Dolin, MD;  [facility]: [facility];  Service: Endoscopy;  Laterality: N/A;  7:30  . Maloney dilation N/A 07/02/2014    Procedure: Venia Minks DILATION;  Surgeon: Daneil Dolin, MD;  [facility]: [facility];  Service: Endoscopy;  Laterality: N/A;  7:30  . Portacath placement  06/2007    "removed ~ 1 yr later"  . Cardiac catheterization  08/2001  . Coronary angioplasty with stent placement  2001; ~ 2006    "1 + 1"   . Left heart catheterization with coronary angiogram N/A 07/13/2013    Procedure: LEFT HEART CATHETERIZATION WITH CORONARY ANGIOGRAM;  Surgeon: Lorretta Harp, MD;  Location: [facility] CATH LAB;  Service: Cardiovascular;  Laterality: N/A;  . Colonoscopy N/A 12/11/2014    Dr. Gala Romney via colostomy. Normal. Repeat in 2021.   Marland Kitchen Esophagogastroduodenoscopy N/A 12/11/2014    [id number] EGD    Social History:  reports that he has never smoked. He has never used smokeless tobacco. He reports that he does not drink alcohol or use illicit drugs.  No Known Allergies  Family History  Problem Relation Age of Onset  . Colon cancer Mother 74  . Colon cancer Sister 14  . Coronary artery disease Father   . Colon cancer Other     2 cousins, succumbed to illness     Prior to Admission medications   Medication Sig Start Date End Date Taking? Authorizing Provider  amiodarone (PACERONE) 200 MG tablet Take 1 tablet (200 mg total) by mouth daily. 05/23/14  Yes Maryann  Mikhail, DO  apixaban (ELIQUIS) 5 MG TABS tablet Take 5 mg by mouth 2 (two) times daily.   Yes Historical Provider, MD  co-enzyme Q-10 50 MG capsule Take 50 mg by mouth every morning.    Yes Historical Provider, MD  digoxin (LANOXIN) 0.125 MG tablet Take 1 tablet by mouth every other day. 10/29/14  Yes Historical Provider, MD  fluticasone (FLONASE) 50 MCG/ACT nasal spray Place 1 spray into both nostrils daily as needed for allergies.  11/21/13  Yes Historical Provider, MD  furosemide (LASIX) 40 MG tablet Take 1 tablet (40 mg total) by mouth daily as needed. 02/10/15  Yes Arnoldo Lenis, MD  HYDROcodone-acetaminophen (NORCO/VICODIN) 5-325 MG per tablet Take 1-2 tablets by mouth every 6 (six) hours as needed. 01/17/15  Yes Orvil Feil, NP  isosorbide dinitrate (ISORDIL) 30 MG tablet Take 15 mg by mouth 2 (two) times daily.  07/31/14  Yes Historical Provider, MD  meclizine (ANTIVERT) 25 MG tablet Take 25 mg by mouth as  needed for dizziness.   Yes Historical Provider, MD  metoprolol succinate (TOPROL-XL) 25 MG 24 hr tablet Take 25 mg by mouth 2 (two) times daily. 01/18/15  Yes Historical Provider, MD  nitroGLYCERIN (NITROLINGUAL) 0.4 MG/SPRAY spray Place 1 spray under the tongue every 5 (five) minutes as needed. angina 07/19/14  Yes Arnoldo Lenis, MD  Omega-3 Fatty Acids (FISH OIL) 1200 MG CAPS Take 1,200 mg by mouth 2 (two) times daily.     Yes Historical Provider, MD  pantoprazole (PROTONIX) 40 MG tablet Take 1 tablet (40 mg total) by mouth daily. 07/19/14  Yes Arnoldo Lenis, MD  rosuvastatin (CRESTOR) 40 MG tablet Take 1 tablet (40 mg total) by mouth at bedtime. 09/27/14  Yes Arnoldo Lenis, MD  acetaminophen (TYLENOL) 500 MG tablet Take 1,000 mg by mouth every 6 (six) hours as needed for moderate pain.    Historical Provider, MD   Physical Exam: Filed Vitals:   02/23/15 1200 02/23/15 1207 02/23/15 1235 02/23/15 1300  BP: 92/57 94/58 80/54  86/60  Pulse: 93 94 94 93  Temp:      TempSrc:       Resp: 16 16 15 15   Height:      Weight:      SpO2: 99% 100% 96% 98%   General:  Appears calm and comfortable Eyes: PERRL, normal lids, irises  ENT: grossly normal hearing, lips & tongue Neck: no LAD, masses or thyromegaly Cardiovascular: RRR, no m/r/g. No LE edema. Respiratory: CTA bilaterally, no w/r/r. Normal respiratory effort. Abdomen: soft, ntnd Skin: no rash or induration noted Musculoskeletal: grossly normal tone BUE/BLE Psychiatric: grossly normal mood and affect, speech fluent and appropriate Neurologic: grossly non-focal.  Wt Readings from Last 3 Encounters:  02/23/15 95.709 kg (211 lb)  02/10/15 95.709 kg (211 lb)  01/17/15 91.536 kg (201 lb 12.8 oz)    Labs on Admission:  Basic Metabolic Panel:  Recent Labs Lab 02/23/15 1001  NA 132*  K 3.4*  CL 98  CO2 22  GLUCOSE 357*  BUN 29*  CREATININE 2.96*  CALCIUM 8.8    Liver Function Tests:  Recent Labs Lab 02/23/15 1001  AST 21  ALT 12  ALKPHOS 84  BILITOT 1.7*  PROT 6.8  ALBUMIN 2.6*    CBC:  Recent Labs Lab 02/23/15 1001  WBC 9.9  NEUTROABS 9.3*  HGB 8.7*  HCT 26.5*  MCV 93.0  PLT 123*    Cardiac Enzymes:  Recent Labs Lab 02/23/15 1001  TROPONINI 0.03   BNP    Component Value Date/Time   BNP 568.0* 02/23/2015 1001    CBG:  Recent Labs Lab 02/23/15 1027  GLUCAP 310*     Radiological Exams on Admission: Dg Chest 2 View  02/23/2015   CLINICAL DATA:  Shortness of breath and cough  EXAM: CHEST  2 VIEW  COMPARISON:  01/10/2015  FINDINGS: Cardiac shadow is enlarged but stable. A defibrillator is again seen and stable. The lungs are well-aerated without focal infiltrate or sizable effusion. No acute bony abnormality is seen.  IMPRESSION: No active cardiopulmonary disease.   Electronically Signed   By: Inez Catalina M.D.   On: 02/23/2015 10:30    Principal Problem:   Orthostatic hypotension Active Problems:   Long term current use of anticoagulant therapy   Hypotension    Chronic kidney disease, stage IV (severe)   DM type 2, uncontrolled, with renal complications   OSA on CPAP   Acute on chronic systolic congestive heart failure  Assessment/Plan 1. Acute on chronic lightheadedness, suspected orthostasis. No syncope. 2. Chronic hypotension with systolic blood pressure baseline 80-90s. 3. Acute on chronic systolic heart failure. Weight seems to be stable 211 pounds today, same as 3/7. 4. Hyperglycemia, DM type 2 uncontrolled, not currently on any medications. 5. Chronic systolic CHF, LVEF 67-34% by echo 09/2014, NYHA III, medical therapy had been limited due to low blood pressures, dizziness, and history of falls falls including a fall with a subarachonid hemorrhage. 6. Ischemic cardiomyopathy, status post ICD placement. No chest pain, troponin negative. No signs or symptoms to suggest ACS. 7. PAF on amiodarone, digoxin, Eliquis. 8. Chronic right upper quadrant abdominal pain, extensively evaluated. Possible cholecystectomy in the future for which she has been evaluated by surgery as well as cardiology. 9. CKD stage IV appears to be stable. 10. OSA, awaiting delivery of CPAP machine. 11. 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 He was treated with Lasix in the emergency department with improvement.   Plan to observe overnight, continue cardiac medications as blood pressure tolerates. Blood pressure appears to be very close to baseline, see previous records. There is no signs or symptoms to suggest  infection or sepsis.  Reevaluate basic metabolic panel and volume status tomorrow.  Cardiology consultation in the morning for further recommendations.  Plan to start long-acting insulin tonight. Patient is currently not any medications. Blood sugar over 300 without evidence of DKA.  Patient has had recurrent hospitalizations and is having difficulty dealing with chronic illness as is his wife. Recommend palliative care  consultation if the patient remains hospitalized 3/22, they agree I think he would benefit from longitudinal palliative care.   Code Status: full code  DVT prophylaxis: Eliquis Family Communication:  Disposition Plan/Anticipated LOS: admit, 1-2 days  Time spent: 90 minutes  Murray Hodgkins, MD  [facility] Pager [phone] 02/23/2015, 1:24 PM

## 2015-02-23 NOTE — Progress Notes (Signed)
Patient stated that he had not start wearing CPAP at this time and would prefer not to wear the CPAP while in the hospital until he gets his settings from his home health provider. RT advised the patient that if he changed his mind to notify the RN and respiratory will get a CPAP set up for him. RT made RN aware. RT will continue to monitor.

## 2015-02-23 NOTE — Consult Note (Signed)
Transfer and consult note  Admit date: 02/23/2015 Name:  HALSTON KINTZ Medical record number: [id number] DOB/Age:  November 08, 1956  59 y.o. male  Referring Physician:   [facility]  Primary Cardiologist:  Dr. Harl Bowie  Chief complaint/reason for admission: Defibrillator went off  HPI:  This 59 year old male has an extremely complex history with known coronary artery disease with prior stenting of the LAD, diabetes hypertension and chronic systolic heart failure.  He has a known ischemic cardiomyopathy and has a biventricular ICD.  He has a history of paroxysmal atrial fibrillation and a prior history of both prostate cancer and rectal cancer.  He is also followed by the heart failure clinic Dr. Andree Elk and [facility] as well as Dr. Harl Bowie.  His history is pertinent for significant orthostasis and dizziness.  He had some encephalopathy and was admitted in January developed acute renal failure as well as a subarachnoid hemorrhage after he fell and hit his head.  He was managed conservatively and he had improvement of his bleeding and anticoagulation was restarted.  He also developed delirium thought due to Celexa and ran next of.  It was felt that his delirium was due to accumulation of these medications within his system.  He was admitted to [facility] with acute dyspnea and dizziness and he was found to be tachycardic hypotensive and given furosemide.  According to the records obtained and he ate some country ham a week ago and then took Lasix for a few days and had reduced swelling but became acutely short of breath this morning he became dizzy in the bathroom.  He has had significant orthostatic hypotension in the past.  Blood pressure was low in the emergency room and he was mildly anemic and he was admitted to [facility].  His evening he became chilly and began shivering and had what was thought to be artifact and tachycardia on the monitor and placed on warming blanket.  The patient  developed a cardiorespiratory arrest and CODE BLUE was called.  He then regained consciousness after 2 minutes after being bagged and of the telemetry strips suggests that he went into ventricular flutter or fibrillation with anti-Tachycardia pacemaking and then will look like a defibrillation.  He was given 2 g of magnesium and 40 mg of potassium.  Lactic acid was elevated and it was recommended that he be transferred to [name].  Currently the patient is awake and alert and has no complaints of shortness of breath.  His blood pressure is somewhat low.   Past Medical History  Diagnosis Date  . Essential hypertension, benign   . Type 2 diabetes mellitus   . CAD (coronary artery disease)     a. BMS to LAD 2001 at [facility] b. PTCA/atherectomy ramus and BMS to LAD 2009  . Chronic systolic heart failure     a. 2D ECHO: 03/01/2014 EF 15-20%, severe, diffuse hypsokinesis. Mod LV dilation. Mod LVH, indeterminate diastolic dysfxn (poor study), high vent filling pressures, abnormal septal wall motion and dyssynergy c/w RV pacing, mild MR, mod LA dilation, RV systolic function mod reduced, mild TR, PA pressure 42.   Marland Kitchen GERD (gastroesophageal reflux disease)   . Cardiomyopathy, ischemic     a. BIV ICD St. Jude, LVEF 23%  . Paroxysmal atrial fibrillation     a. on amiodarone, digoxin and Eliquis  . Adenocarcinoma of rectum     a. 2008  . Prostate cancer     a. s/p seed implants with chemo and  radiation  . 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 St Jude  . TIA (transient ischemic attack)   . OSA on CPAP   . HLD (hyperlipidemia)   . Orthostatic hypotension   . Dizziness     a. chronic. Admission for this 07/18/2014  . History of falling July 2015    due to dizziness from medications  . CHF (congestive heart failure)   . Renal insufficiency        Past Surgical History  Procedure Laterality Date  . Internal defibrillator and pacemaker  2010    x2 St. Jude device   . Abdominal and perineal resection of rectum with total mesorectal excision  10/04/2007  . Colonoscopy      05/17/2007. IMPRESSION: Semilunar, apple-core neoplasm low in the rectum (palpable on digital rectal exam) beginning at 5 cm and corkscrewing up 5 cm in length. This was a low rectal lesion consistent with colorectal carcinoma. It was biosied multiple times. The upstream colon all the way to the cecum appeared normal. Recommendations: Followup on path. Surgical Consultation   . Colonoscopy  09/14/2011    Dr. Gala Romney: via colostomy, Single pedunculated benign inflammatory polyp. Due for surveillance Oct 2015  . Colostomy    . Colonoscopy N/A 07/02/2014    Procedure: COLONOSCOPY;  Surgeon: Daneil Dolin, MD;  [facility];  Service: Endoscopy;  Laterality: N/A;  7:30 / COLONOSCOPY THRU COLOSTOMY  . Esophagogastroduodenoscopy N/A 07/02/2014    Procedure: ESOPHAGOGASTRODUODENOSCOPY (EGD);  Surgeon: Daneil Dolin, MD;  [facility];  Service: Endoscopy;  Laterality: N/A;  7:30  . Savory dilation N/A 07/02/2014    Procedure: SAVORY DILATION;  Surgeon: Daneil Dolin, MD;  [facility];  Service: Endoscopy;  Laterality: N/A;  7:30  . Maloney dilation N/A 07/02/2014    Procedure: Venia Minks DILATION;  Surgeon: Daneil Dolin, MD;  [facility];  Service: Endoscopy;  Laterality: N/A;  7:30  . Portacath placement  06/2007    "removed ~ 1 yr later"  . Cardiac catheterization  08/2001  . Coronary angioplasty with stent placement  2001; ~ 2006    "1 + 1"   . Left heart catheterization with coronary angiogram N/A 07/13/2013    Procedure: LEFT HEART CATHETERIZATION WITH CORONARY ANGIOGRAM;  Surgeon: Lorretta Harp, MD;  Location: [facility] CATH LAB;  Service: Cardiovascular;  Laterality: N/A;  . Colonoscopy N/A 12/11/2014    Dr. Gala Romney via colostomy. Normal. Repeat in 2021.   Marland Kitchen Esophagogastroduodenoscopy N/A 12/11/2014    YQM:[id number] EGD  .  Allergies: has No Known  Allergies.   Medications: Prior to Admission medications   Medication Sig Start Date End Date Taking? Authorizing Provider  amiodarone (PACERONE) 200 MG tablet Take 1 tablet (200 mg total) by mouth daily. 05/23/14  Yes Maryann Mikhail, DO  apixaban (ELIQUIS) 5 MG TABS tablet Take 5 mg by mouth 2 (two) times daily.   Yes Historical Provider, MD  co-enzyme Q-10 50 MG capsule Take 50 mg by mouth every morning.    Yes Historical Provider, MD  digoxin (LANOXIN) 0.125 MG tablet Take 1 tablet by mouth every other day. 10/29/14  Yes Historical Provider, MD  fluticasone (FLONASE) 50 MCG/ACT nasal spray Place 1 spray into both nostrils daily as needed for allergies.  11/21/13  Yes Historical Provider, MD  furosemide (LASIX) 40 MG tablet Take 1 tablet (40 mg total) by mouth daily as needed. 02/10/15  Yes Arnoldo Lenis,  MD  HYDROcodone-acetaminophen (NORCO/VICODIN) 5-325 MG per tablet Take 1-2 tablets by mouth every 6 (six) hours as needed. 01/17/15  Yes Orvil Feil, NP  isosorbide dinitrate (ISORDIL) 30 MG tablet Take 15 mg by mouth 2 (two) times daily.  07/31/14  Yes Historical Provider, MD  meclizine (ANTIVERT) 25 MG tablet Take 25 mg by mouth as needed for dizziness.   Yes Historical Provider, MD  metoprolol succinate (TOPROL-XL) 25 MG 24 hr tablet Take 25 mg by mouth 2 (two) times daily. 01/18/15  Yes Historical Provider, MD  nitroGLYCERIN (NITROLINGUAL) 0.4 MG/SPRAY spray Place 1 spray under the tongue every 5 (five) minutes as needed. angina 07/19/14  Yes Arnoldo Lenis, MD  Omega-3 Fatty Acids (FISH OIL) 1200 MG CAPS Take 1,200 mg by mouth 2 (two) times daily.     Yes Historical Provider, MD  pantoprazole (PROTONIX) 40 MG tablet Take 1 tablet (40 mg total) by mouth daily. 07/19/14  Yes Arnoldo Lenis, MD  rosuvastatin (CRESTOR) 40 MG tablet Take 1 tablet (40 mg total) by mouth at bedtime. 09/27/14  Yes Arnoldo Lenis, MD  acetaminophen (TYLENOL) 500 MG tablet Take 1,000 mg by mouth every 6 (six)  hours as needed for moderate pain.    Historical Provider, MD   Family History:  Family Status  Relation Status Death Age  . Mother    . Father Deceased   . Sister Alive     (2)  . Brother Alive     Social History:   reports that he has never smoked. He has never used smokeless tobacco. He reports that he does not drink alcohol or use illicit drugs.   History   Social History Narrative     Review of Systems:  Other than as noted above, the remainder of the review of systems is normal  Physical Exam: BP 83/49 mmHg  Pulse 91  Temp(Src) 97.7 F (36.5 C) (Oral)  Resp 17  Ht 5\' 10"  (1.778 m)  Wt 96.6 kg (212 lb 15.4 oz)  BMI 30.56 kg/m2  SpO2 100% General appearance: Middle-aged male mildly obese in no acute distress Head: Normocephalic, without obvious abnormality, atraumatic Neck: no adenopathy, no carotid bruit, no JVD and supple, symmetrical, trachea midline Lungs: clear to auscultation bilaterally Heart: Normal S1 and S2, soft S3 heard, no murmur Abdomen: soft, non-tender; bowel sounds normal; no masses,  no organomegaly Rectal: deferred Extremities: Trace edema Pulses: 2+ and symmetric Skin: Skin color, texture, turgor normal. No rashes or lesions Neurologic: Grossly normal  Labs: CBC  Recent Labs  02/23/15 1940  WBC 2.4*  RBC 3.12*  HGB 9.5*  HCT 29.6*  PLT 148*  MCV 94.9  MCH 30.4  MCHC 32.1  RDW 17.5*  LYMPHSABS 0.3*  MONOABS 0.0*  EOSABS 0.0  BASOSABS 0.0   CMP   Recent Labs  02/23/15 1940  NA 130*  K 4.0  CL 95*  CO2 17*  GLUCOSE 395*  BUN 31*  CREATININE 3.29*  CALCIUM 8.6  PROT 7.3  ALBUMIN 2.7*  AST 38*  ALT <5  ALKPHOS 95  BILITOT 1.9*  GFRNONAA 19*  GFRAA 22*   BNP (last 3 results)  Recent Labs  07/18/14 0555 09/27/14 0523 11/19/14 0745  PROBNP 1116.0* 9164.0* 5233.0*   BNP    Component Value Date/Time   BNP 568.0* 02/23/2015 1001    Cardiac Panel (last 3 results)  Recent Labs  02/23/15 1001  02/23/15 1940  TROPONINI 0.03 0.03   Thyroid  Lab  Results  Component Value Date   TSH 3.652 01/01/2015    EKG: Atrial sensed ventricular paced rhythm  Radiology: No active cardiopulmonary disease, lungs clear, defibrillator in place   IMPRESSIONS: 1.  Ventricular arrhythmia with appropriate defibrillation rhythm strips-this will be verified in the morning with interrogation 2.  Chronic systolic heart failure with recent ejection fraction of 18% by nuclear medicine study.  A prior echocardiogram in October reportedly showed ejection fraction of 30-35% 3.  Ischemic cardiomyopathy with previous stenting 4.  Hypotension  5.  Paroxysmal atrial fibrillation  6.  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  History of prostate cancer  PLAN: Very complicated.  He clinically is more alert this evening.  We'll obtain serial cardiac enzymes.  Repeat echocardiogram in the morning.  With his blood pressure being low may need to hold some of his blood pressure medications.  His BNP is not greatly elevated and lungs were clear so he may be over diuresed.  Recheck echocardiogram to see if his ejection fraction has dropped by echo since October interrogate defibrillator in morning.  Heart failure team may need to see.   Signed: Kerry Hough MD [facility] Cardiology  02/23/2015, 11:45 PM

## 2015-02-23 NOTE — ED Notes (Addendum)
EMS called out sob.  Pt c/o nausea and sob.  EMS gave zofran 4 mg IV.  CBG 385

## 2015-02-23 NOTE — Progress Notes (Signed)
Patient had complained of being chilly and shivering with the nurse tech prior, displaying artifact on the monitor.  The patient again displayed some artifact with a tachycardic heart rate and rhythm on the monitor.  Entered the room to lay physical eyes on the patient and gave him another warm blanket.  Patient appeared slightly jaundice in her sclera and color but was alert and oriented to person, place, time and situation.  Had a four to five minute conversation with recognition of helping him from previous visits.  Let room to obtain new leads to verify placement of leads, upon return patient was talking then notice pacemaker not picking up on monitor with fluctuations in his heart rate.  Initiated code blue protocol at 1925 and observed patient lose consciousness.

## 2015-02-23 NOTE — Progress Notes (Signed)
At approximately 7:30 PM code blue was called. By the time I arrived patient was being bagged by nursing and patient was regaining consciousness. Per nursing patient was complaining of being cold, as a blanket was being brought to the patient he suddenly lost consciousness and had some shaking like activity. Patient was bagged for 2 mins. He did not receive chest compressions. After about 2 mins, he regained consciousness and oriented x2 intially then after few mins was oriented x3. On telemetry patient had V fib/tach with torsades. Patient was given 2 gm of magnesium sulfate and was ordered 40 mg of IV potassium ordered. Ordered CBC, CMET, PT/INR, troponin. Lactic acid was 7.3. Discussed with Dr. Wynonia Lawman cardiology, who accepted the patient on transfer to [facility]. Patient and wife updated at bedside. Rhythm strips to be printed prior to transfer to [place] care time spent at patient's bedside 35 mins (including time to coordinate care, talking to patient and family).  Siobhan Zaro A, MD 02/23/2015, 8:27 PM

## 2015-02-24 ENCOUNTER — Encounter (HOSPITAL_COMMUNITY): Payer: Self-pay | Admitting: Cardiology

## 2015-02-24 ENCOUNTER — Encounter (HOSPITAL_COMMUNITY)
Admission: EM | Disposition: A | Discharge status: Home / Self Care | Payer: Self-pay | Source: Home / Self Care | Attending: Cardiology

## 2015-02-24 DIAGNOSIS — Z0389 Encounter for observation for other suspected diseases and conditions ruled out: Secondary | ICD-10-CM

## 2015-02-24 DIAGNOSIS — Z95810 Presence of automatic (implantable) cardiac defibrillator: Secondary | ICD-10-CM

## 2015-02-24 DIAGNOSIS — I5043 Acute on chronic combined systolic (congestive) and diastolic (congestive) heart failure: Principal | ICD-10-CM

## 2015-02-24 DIAGNOSIS — I509 Heart failure, unspecified: Secondary | ICD-10-CM

## 2015-02-24 DIAGNOSIS — Z8679 Personal history of other diseases of the circulatory system: Secondary | ICD-10-CM | POA: Diagnosis present

## 2015-02-24 DIAGNOSIS — R570 Cardiogenic shock: Secondary | ICD-10-CM | POA: Diagnosis present

## 2015-02-24 HISTORY — PX: RIGHT HEART CATHETERIZATION: SHX5447

## 2015-02-24 LAB — POCT I-STAT 3, VENOUS BLOOD GAS (G3P V)
Acid-base deficit: 1 mmol/L (ref 0.0–2.0)
Acid-base deficit: 2 mmol/L (ref 0.0–2.0)
BICARBONATE: 23 meq/L (ref 20.0–24.0)
Bicarbonate: 23.9 mEq/L (ref 20.0–24.0)
Bicarbonate: 24.4 mEq/L — ABNORMAL HIGH (ref 20.0–24.0)
O2 SAT: 57 %
O2 SAT: 71 %
O2 Saturation: 60 %
PCO2 VEN: 38.8 mmHg — AB (ref 45.0–50.0)
PH VEN: 7.388 — AB (ref 7.250–7.300)
PO2 VEN: 30 mmHg (ref 30.0–45.0)
TCO2: 25 mmol/L (ref 0–100)
TCO2: 24 mmol/L (ref 0–100)
TCO2: 26 mmol/L (ref 0–100)
pCO2, Ven: 38.1 mmHg — ABNORMAL LOW (ref 45.0–50.0)
pCO2, Ven: 41 mmHg — ABNORMAL LOW (ref 45.0–50.0)
pH, Ven: 7.374 — ABNORMAL HIGH (ref 7.250–7.300)
pH, Ven: 7.406 — ABNORMAL HIGH (ref 7.250–7.300)
pO2, Ven: 31 mmHg (ref 30.0–45.0)
pO2, Ven: 38 mmHg (ref 30.0–45.0)

## 2015-02-24 LAB — GLUCOSE, CAPILLARY
GLUCOSE-CAPILLARY: 382 mg/dL — AB (ref 70–99)
GLUCOSE-CAPILLARY: 302 mg/dL — AB (ref 70–99)
Glucose-Capillary: 312 mg/dL — ABNORMAL HIGH (ref 70–99)
Glucose-Capillary: 280 mg/dL — ABNORMAL HIGH (ref 70–99)
Glucose-Capillary: 244 mg/dL — ABNORMAL HIGH (ref 70–99)

## 2015-02-24 LAB — TSH: TSH: 3.025 u[IU]/mL (ref 0.350–4.500)

## 2015-02-24 LAB — BASIC METABOLIC PANEL
ANION GAP: 13 (ref 5–15)
BUN: 30 mg/dL — AB (ref 6–23)
CALCIUM: 9.1 mg/dL (ref 8.4–10.5)
CHLORIDE: 98 mmol/L (ref 96–112)
CO2: 23 mmol/L (ref 19–32)
CREATININE: 3.5 mg/dL — AB (ref 0.50–1.35)
GFR calc Af Amer: 20 mL/min — ABNORMAL LOW (ref >90)
GFR calc non Af Amer: 18 mL/min — ABNORMAL LOW (ref >90)
Glucose, Bld: 292 mg/dL — ABNORMAL HIGH (ref 70–99)
POTASSIUM: 4.4 mmol/L (ref 3.5–5.1)
SODIUM: 134 mmol/L — AB (ref 135–145)

## 2015-02-24 LAB — TROPONIN I: Troponin I: 0.06 ng/mL — ABNORMAL HIGH (ref <0.031)

## 2015-02-24 SURGERY — RIGHT HEART CATH
Anesthesia: LOCAL

## 2015-02-24 MED ORDER — ACETAMINOPHEN 325 MG PO TABS
650.0000 mg | ORAL_TABLET | ORAL | Status: DC | PRN
  Administered 2015-02-25 (×2): 650 mg via ORAL
  Filled 2015-02-24 (×2): qty 2

## 2015-02-24 MED ORDER — SODIUM CHLORIDE 0.9 % IV SOLN: 250.0000 mL | INTRAVENOUS | Status: DC | PRN

## 2015-02-24 MED ORDER — SODIUM CHLORIDE 0.9 % IJ SOLN
3.0000 mL | Freq: Two times a day (BID) | INTRAMUSCULAR | Status: DC
  Administered 2015-02-24: 3 mL via INTRAVENOUS

## 2015-02-24 MED ORDER — PERFLUTREN LIPID MICROSPHERE
INTRAVENOUS | Status: AC
  Administered 2015-02-24: 3 mL
  Filled 2015-02-24: qty 10

## 2015-02-24 MED ORDER — HEPARIN (PORCINE) IN NACL 2-0.9 UNIT/ML-% IJ SOLN
INTRAMUSCULAR | Status: AC
  Filled 2015-02-24: qty 500

## 2015-02-24 MED ORDER — ACETAMINOPHEN 325 MG PO TABS: 650.0000 mg | ORAL_TABLET | ORAL | Status: DC | PRN

## 2015-02-24 MED ORDER — APIXABAN 5 MG PO TABS
5.0000 mg | ORAL_TABLET | Freq: Two times a day (BID) | ORAL | Status: DC
  Administered 2015-02-24 – 2015-02-27 (×6): 5 mg via ORAL
  Filled 2015-02-24 (×7): qty 1

## 2015-02-24 MED ORDER — ONDANSETRON HCL 4 MG/2ML IJ SOLN: 4.0000 mg | Freq: Four times a day (QID) | INTRAMUSCULAR | Status: DC | PRN

## 2015-02-24 MED ORDER — PERFLUTREN LIPID MICROSPHERE
1.0000 mL | INTRAVENOUS | Status: AC | PRN
  Filled 2015-02-24: qty 10

## 2015-02-24 MED ORDER — SODIUM CHLORIDE 0.9 % IJ SOLN: 3.0000 mL | INTRAMUSCULAR | Status: DC | PRN

## 2015-02-24 MED ORDER — ONDANSETRON HCL 4 MG/2ML IJ SOLN
4.0000 mg | Freq: Four times a day (QID) | INTRAMUSCULAR | Status: DC | PRN
  Administered 2015-02-24: 4 mg via INTRAVENOUS
  Filled 2015-02-24: qty 2

## 2015-02-24 MED ORDER — SODIUM CHLORIDE 0.9 % IJ SOLN: 3.0000 mL | Freq: Two times a day (BID) | INTRAMUSCULAR | Status: DC

## 2015-02-24 MED ORDER — SODIUM CHLORIDE 0.9 % IJ SOLN
3.0000 mL | Freq: Two times a day (BID) | INTRAMUSCULAR | Status: DC
  Administered 2015-02-25 – 2015-02-27 (×5): 3 mL via INTRAVENOUS

## 2015-02-24 MED ORDER — SODIUM CHLORIDE 0.9 % IV SOLN
250.0000 mL | INTRAVENOUS | Status: DC | PRN
  Administered 2015-02-25: 250 mL via INTRAVENOUS

## 2015-02-24 MED ORDER — LIDOCAINE HCL (PF) 1 % IJ SOLN
INTRAMUSCULAR | Status: AC
  Filled 2015-02-24: qty 30

## 2015-02-24 MED FILL — Medication: Qty: 1 | Status: AC

## 2015-02-24 NOTE — Interval H&P Note (Signed)
History and Physical Interval Note:  02/24/2015 4:11 PM  [name]  has presented today for surgery, with the diagnosis of cardiogenic shock The various methods of treatment have been discussed with the patient and family. After consideration of risks, benefits and other options for treatment, the patient has consented to  Procedure(s): RIGHT HEART CATH (N/A) and possible IABP placement as a surgical intervention .  The patient's history has been reviewed, patient examined, no change in status, stable for surgery.  I have reviewed the patient's chart and labs.  Questions were answered to the patient's satisfaction.     Armistead Sult

## 2015-02-24 NOTE — Progress Notes (Signed)
Inpatient Diabetes Program Recommendations  AACE/ADA: New Consensus Statement on Inpatient Glycemic Control (2013)  Target Ranges:  Prepandial:   less than 140 mg/dL      Peak postprandial:   less than 180 mg/dL (1-2 hours)      Critically ill patients:  140 - 180 mg/dL   Reason for Visit: CHF  Diabetes history: DM 2 Outpatient Diabetes medications: None listed in med req. However, on previous admissions there are DM medications listed. Current orders for Inpatient glycemic control: Lantus 5 units QHS, Novolog 0-15 units TID, Novolog 0-5 units QHS  Inpatient Diabetes Program Recommendations Insulin - Basal: Please consider increasing basal insulin to Lantus 10 units QHS(.1units/kg).   MD: Note Last A1c 10.6% on 12/24/14. (average of 258 mg/dl glucose level). Poor control at home.  Thanks,  Tama Headings RN, MSN, [facility] Inpatient Diabetes Coordinator Team Pager [phone]

## 2015-02-24 NOTE — Discharge Instructions (Signed)

## 2015-02-24 NOTE — Progress Notes (Addendum)
  Echocardiogram 2D Echocardiogram with Definity has been performed.  Diamond Nickel 02/24/2015, 3:15 PM

## 2015-02-24 NOTE — Care Management Note (Addendum)
    Page 1 of 1   02/27/2015     11:26:10 AM CARE MANAGEMENT NOTE 02/27/2015  Patient:  [name], [name]   Account Number:  [id number]  Date Initiated:  02/24/2015  Documentation initiated by:  Elissa Hefty  Subjective/Objective Assessment:   adm w orthost hypotension     Action/Plan:   lives w fam, pcp dr Purcell Nails fusco   Anticipated DC Date:  02/27/2015   Anticipated DC Plan:  [place]         [facility] Choice  Resumption Of Svcs/PTA Provider   Choice offered to / List presented to:          [facility] arranged  HH-1 RN  [place].   Status of service:  In process, will continue to follow Medicare Important Message given?  YES (If response is "NO", the following Medicare IM given date fields will be blank) Date Medicare IM given:  02/26/2015 Medicare IM given by:  Elissa Hefty Date Additional Medicare IM given:   Additional Medicare IM given by:    Discharge Disposition:  [place]  Per UR Regulation:  Reviewed for med. necessity/level of care/duration of stay  If discussed at [place] of Stay Meetings, dates discussed:    Comments:  02/27/15 Ellan Lambert, RN, BSN [phone] Pt adm on 02/23/15 with Vfib, Cardiogenic shock.  PTA, pt resides at home with wife.  Planning dc home today. Will need order for resumption of HHRN prior to dc.  3/23 1048 debbie dowell rn,bsn pt act w [facility] prior to adm. [facility] aware pt in hosp.

## 2015-02-24 NOTE — Consult Note (Signed)
Reason for Consult:   VF, ICD discharge  Requesting Physician: Dr Debara Pickett  HPI:  This is a 59 y.o.Rt handed male with a past medical history significant for rectal cancer- s/p colostomy 2008, prostate cancer, stage 4 CRI, DM, and ICM. He has been seen at [facility] for transplant but was told that he is not a candidate for a heart but possibly is a candidate for a "mechanical device". He had an ICD in 2002 with an upgrade to a Long View ICD in 2009. His EF in Oct 2015 was 30-35%. He has CAD and is s/p remote LAD PCI. His last cath was 2014- medical Rx. Myoview 02/17/15- scar with ischemia- no change.  He EF by Myoview was 18%.           He was admitted to [facility] 02/22/15 with acute CHF and hypotension. He presented as low cardiac output. He went into VF while in the ED and his defibrillator shocked him. He was transferred to [facility] for further evaluation. He has had no further VF/VT.    PMHx:  Past Medical History  Diagnosis Date  . Essential hypertension, benign   . CAD (coronary artery disease)     a. BMS to LAD 2001 at [facility] b. PTCA/atherectomy ramus and BMS to LAD 2009  . Chronic systolic heart failure   . GERD (gastroesophageal reflux disease)   . Paroxysmal atrial fibrillation     a. on amiodarone, digoxin and Eliquis  . Adenocarcinoma of rectum     a. 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 Prostate cancer     a. s/p seed implants with chemo and radiation  . Biventricular ICD (implantable cardioverter-defibrillator) in place 2009    upgrade  . TIA (transient ischemic attack)   . OSA on CPAP   . HLD (hyperlipidemia)   . Orthostatic hypotension   . Dizziness     a. chronic. Admission for this 07/18/2014  . History of falling July 2015    due to dizziness from medications  . Ischemic cardiomyopathy     EF 18% by nuclear study 2016, multiple myocardial infarctions in past    . Chronic kidney disease, stage IV (severe) 12/11/2013  . DM type 2, uncontrolled, with renal complications 3/87/5643   . Obesity 12/12/2013    Past Surgical History  Procedure Laterality Date  . Internal defibrillator and pacemaker  2002  . Abdominal and perineal resection of rectum with total mesorectal excision  10/04/2007  . Colonoscopy      05/17/2007. IMPRESSION: Semilunar, apple-core neoplasm low in the rectum (palpable on digital rectal exam) beginning at 5 cm and corkscrewing up 5 cm in length. This was a low rectal lesion consistent with colorectal carcinoma. It was biosied multiple times. The upstream colon all the way to the cecum appeared normal. Recommendations: Followup on path. Surgical Consultation   . Colonoscopy  09/14/2011    Dr. Gala Romney: via colostomy, Single pedunculated benign inflammatory polyp. Due for surveillance Oct 2015  . Colostomy    . Colonoscopy N/A 07/02/2014    Procedure: COLONOSCOPY;  Surgeon: Daneil Dolin, MD;  Location: [facility];  Service: Endoscopy;  Laterality: N/A;  7:30 / COLONOSCOPY THRU COLOSTOMY  . Esophagogastroduodenoscopy N/A 07/02/2014    Procedure: ESOPHAGOGASTRODUODENOSCOPY (EGD);  Surgeon: Daneil Dolin, MD;  Location: [facility] ENDO SUITE;  Service: Endoscopy;  Laterality: N/A;  7:30  . Savory dilation N/A 07/02/2014    Procedure: SAVORY DILATION;  Surgeon: Cristopher Estimable  Rourk, MD;  Location: [facility] ENDO SUITE;  Service: Endoscopy;  Laterality: N/A;  7:30  . Maloney dilation N/A 07/02/2014    Procedure: Venia Minks DILATION;  Surgeon: Daneil Dolin, MD;  Location: [facility] ENDO [facility];  Service: Endoscopy;  Laterality: N/A;  7:30  . Portacath placement  06/2007    "removed ~ 1 yr later"  . Cardiac catheterization  08/2001  . Coronary angioplasty with stent placement  2001; ~ 2006    "1 + 1"   . Left heart catheterization with coronary angiogram N/A 07/13/2013    Procedure: LEFT HEART CATHETERIZATION WITH CORONARY ANGIOGRAM;  Surgeon: Lorretta Harp, MD;  Location: [facility] CATH LAB;  Service: Cardiovascular;  Laterality: N/A;  . Colonoscopy N/A 12/11/2014    Dr. Gala Romney via colostomy.  Normal. Repeat in 2021.   Marland Kitchen Esophagogastroduodenoscopy N/A 12/11/2014    [id number] EGD  . Cardiac defibrillator placement  2009    Upgraded to a BiV ICD    SOCHx:  reports that he has never smoked. He has never used smokeless tobacco. He reports that he does not drink alcohol or use illicit drugs.  FAMHx: Family History  Problem Relation Age of Onset  . Colon cancer Mother 31  . Colon cancer Sister 76  . Coronary artery disease Father   . Colon cancer Other     2 cousins, succumbed to illness    ALLERGIES: No Known Allergies  ROS: Pertinent items are noted in HPI. See H&P for complete details. He says he has been compliant with medications and diet.   HOME MEDICATIONS: Prior to Admission medications   Medication Sig Start Date End Date Taking? Authorizing Provider  amiodarone (PACERONE) 200 MG tablet Take 1 tablet (200 mg total) by mouth daily. 05/23/14  Yes Maryann Mikhail, DO  apixaban (ELIQUIS) 5 MG TABS tablet Take 5 mg by mouth 2 (two) times daily.   Yes Historical Provider, MD  co-enzyme Q-10 50 MG capsule Take 50 mg by mouth every morning.    Yes Historical Provider, MD  digoxin (LANOXIN) 0.125 MG tablet Take 1 tablet by mouth every other day. 10/29/14  Yes Historical Provider, MD  fluticasone (FLONASE) 50 MCG/ACT nasal spray Place 1 spray into both nostrils daily as needed for allergies.  11/21/13  Yes Historical Provider, MD  furosemide (LASIX) 40 MG tablet Take 1 tablet (40 mg total) by mouth daily as needed. 02/10/15  Yes Arnoldo Lenis, MD  HYDROcodone-acetaminophen (NORCO/VICODIN) 5-325 MG per tablet Take 1-2 tablets by mouth every 6 (six) hours as needed. 01/17/15  Yes Orvil Feil, NP  isosorbide dinitrate (ISORDIL) 30 MG tablet Take 15 mg by mouth 2 (two) times daily.  07/31/14  Yes Historical Provider, MD  meclizine (ANTIVERT) 25 MG tablet Take 25 mg by mouth as needed for dizziness.   Yes Historical Provider, MD  metoprolol succinate (TOPROL-XL) 25 MG 24 hr tablet  Take 25 mg by mouth 2 (two) times daily. 01/18/15  Yes Historical Provider, MD  nitroGLYCERIN (NITROLINGUAL) 0.4 MG/SPRAY spray Place 1 spray under the tongue every 5 (five) minutes as needed. angina 07/19/14  Yes Arnoldo Lenis, MD  Omega-3 Fatty Acids (FISH OIL) 1200 MG CAPS Take 1,200 mg by mouth 2 (two) times daily.     Yes Historical Provider, MD  pantoprazole (PROTONIX) 40 MG tablet Take 1 tablet (40 mg total) by mouth daily. 07/19/14  Yes Arnoldo Lenis, MD  rosuvastatin (CRESTOR) 40 MG tablet Take 1 tablet (40 mg total) by mouth at  bedtime. 09/27/14  Yes Arnoldo Lenis, MD  acetaminophen (TYLENOL) 500 MG tablet Take 1,000 mg by mouth every 6 (six) hours as needed for moderate pain.    Historical Provider, MD    HOSPITAL MEDICATIONS: I have reviewed the patient's current medications.  VITALS: Blood pressure 86/51, pulse 72, temperature 97.8 F (36.6 C), temperature source Oral, resp. rate 10, height 5\' 10"  (1.778 m), weight 217 lb 9.5 oz (98.7 kg), SpO2 100 %.  PHYSICAL EXAM: General appearance: alert, cooperative, no distress and moderately obese Neck: no carotid bruit and no JVD Lungs: clear to auscultation bilaterally and decreased breath sounds Heart: regular rate and rhythm Abdomen: obese, colostomy LLQ Extremities: 1+ pitting edema bilat Pulses: diminished Skin: pale, cool, dry Neurologic: Grossly normal  LABS: Results for orders placed or performed during the hospital encounter of 02/23/15 (from the past 24 hour(s))  MRSA PCR Screening     Status: None   Collection Time: 02/23/15  2:45 PM  Result Value Ref Range   MRSA by PCR NEGATIVE NEGATIVE  Glucose, capillary     Status: Abnormal   Collection Time: 02/23/15  4:55 PM  Result Value Ref Range   Glucose-Capillary 382 (H) 70 - 99 mg/dL   Comment 1 Notify RN    Comment 2 Document in Chart   CBC with Differential/Platelet     Status: Abnormal   Collection Time: 02/23/15  7:40 PM  Result Value Ref Range   WBC  2.4 (L) 4.0 - 10.5 K/uL   RBC 3.12 (L) 4.22 - 5.81 MIL/uL   Hemoglobin 9.5 (L) 13.0 - 17.0 g/dL   HCT 29.6 (L) 39.0 - 52.0 %   MCV 94.9 78.0 - 100.0 fL   MCH 30.4 26.0 - 34.0 pg   MCHC 32.1 30.0 - 36.0 g/dL   RDW 17.5 (H) 11.5 - 15.5 %   Platelets 148 (L) 150 - 400 K/uL   Neutrophils Relative % 88 (H) 43 - 77 %   Neutro Abs 2.1 1.7 - 7.7 K/uL   Lymphocytes Relative 11 (L) 12 - 46 %   Lymphs Abs 0.3 (L) 0.7 - 4.0 K/uL   Monocytes Relative 1 (L) 3 - 12 %   Monocytes Absolute 0.0 (L) 0.1 - 1.0 K/uL   Eosinophils Relative 0 0 - 5 %   Eosinophils Absolute 0.0 0.0 - 0.7 K/uL   Basophils Relative 0 0 - 1 %   Basophils Absolute 0.0 0.0 - 0.1 K/uL  Comprehensive metabolic panel     Status: Abnormal   Collection Time: 02/23/15  7:40 PM  Result Value Ref Range   Sodium 130 (L) 135 - 145 mmol/L   Potassium 4.0 3.5 - 5.1 mmol/L   Chloride 95 (L) 96 - 112 mmol/L   CO2 17 (L) 19 - 32 mmol/L   Glucose, Bld 395 (H) 70 - 99 mg/dL   BUN 31 (H) 6 - 23 mg/dL   Creatinine, Ser 3.29 (H) 0.50 - 1.35 mg/dL   Calcium 8.6 8.4 - 10.5 mg/dL   Total Protein 7.3 6.0 - 8.3 g/dL   Albumin 2.7 (L) 3.5 - 5.2 g/dL   AST 38 (H) 0 - 37 U/L   ALT <5 0 - 53 U/L   Alkaline Phosphatase 95 39 - 117 U/L   Total Bilirubin 1.9 (H) 0.3 - 1.2 mg/dL   GFR calc non Af Amer 19 (L) >90 mL/min   GFR calc Af Amer 22 (L) >90 mL/min   Anion gap 18 (H) 5 -  15  Magnesium     Status: None   Collection Time: 02/23/15  7:40 PM  Result Value Ref Range   Magnesium 1.8 1.5 - 2.5 mg/dL  Phosphorus     Status: None   Collection Time: 02/23/15  7:40 PM  Result Value Ref Range   Phosphorus 4.3 2.3 - 4.6 mg/dL  Troponin I (q 6hr x 3)     Status: None   Collection Time: 02/23/15  7:40 PM  Result Value Ref Range   Troponin I 0.03 <0.031 ng/mL  Protime-INR     Status: Abnormal   Collection Time: 02/23/15  7:40 PM  Result Value Ref Range   Prothrombin Time 21.5 (H) 11.6 - 15.2 seconds   INR 1.85 (H) 0.00 - 1.49  Lactic acid, plasma      Status: Abnormal   Collection Time: 02/23/15  7:40 PM  Result Value Ref Range   Lactic Acid, Venous 7.3 (HH) 0.5 - 2.0 mmol/L  Glucose, capillary     Status: Abnormal   Collection Time: 02/23/15  7:42 PM  Result Value Ref Range   Glucose-Capillary 312 (H) 70 - 99 mg/dL   Comment 1 Notify RN   Glucose, capillary     Status: Abnormal   Collection Time: 02/23/15  9:38 PM  Result Value Ref Range   Glucose-Capillary 302 (H) 70 - 99 mg/dL   Comment 1 Notify RN   TSH     Status: None   Collection Time: 02/24/15  1:09 AM  Result Value Ref Range   TSH 3.025 0.350 - 4.500 uIU/mL  Basic metabolic panel     Status: Abnormal   Collection Time: 02/24/15  1:09 AM  Result Value Ref Range   Sodium 134 (L) 135 - 145 mmol/L   Potassium 4.4 3.5 - 5.1 mmol/L   Chloride 98 96 - 112 mmol/L   CO2 23 19 - 32 mmol/L   Glucose, Bld 292 (H) 70 - 99 mg/dL   BUN 30 (H) 6 - 23 mg/dL   Creatinine, Ser 3.50 (H) 0.50 - 1.35 mg/dL   Calcium 9.1 8.4 - 10.5 mg/dL   GFR calc non Af Amer 18 (L) >90 mL/min   GFR calc Af Amer 20 (L) >90 mL/min   Anion gap 13 5 - 15  Troponin I     Status: Abnormal   Collection Time: 02/24/15  1:16 AM  Result Value Ref Range   Troponin I 0.06 (H) <0.031 ng/mL  Glucose, capillary     Status: Abnormal   Collection Time: 02/24/15  8:27 AM  Result Value Ref Range   Glucose-Capillary 280 (H) 70 - 99 mg/dL  Glucose, capillary     Status: Abnormal   Collection Time: 02/24/15 12:34 PM  Result Value Ref Range   Glucose-Capillary 244 (H) 70 - 99 mg/dL    EKG: AV paced  IMAGING: Dg Chest 2 View  02/23/2015   CLINICAL DATA:  Shortness of breath and cough  EXAM: CHEST  2 VIEW  COMPARISON:  01/10/2015  FINDINGS: Cardiac shadow is enlarged but stable. A defibrillator is again seen and stable. The lungs are well-aerated without focal infiltrate or sizable effusion. No acute bony abnormality is seen.  IMPRESSION: No active cardiopulmonary disease.   Electronically Signed   By: Inez Catalina M.D.   On: 02/23/2015 10:30    IMPRESSION: Principal Problem:   Ventricular fibrillation Active Problems:   Ischemic cardiomyopathy   Acute on chronic systolic congestive heart failure   Cardiogenic  shock   ICD (implantable cardioverter-defibrillator) discharge   CAD with LCX disease and stents to LAD   Chronic systolic heart failure   Paroxysmal atrial fibrillation   Chronic kidney disease, stage IV (severe)   DM type 2, uncontrolled, with renal complications   Orthostatic hypotension   History of rectal cancer   Long term current use of anticoagulant therapy   Obesity   GERD (gastroesophageal reflux disease)   OSA on CPAP   Hyperlipidemia   RECOMMENDATION: Dr Lovena Le to see.  ICD interrogation data on chart.   Time Spent Directly with Patient: 45 minutes  Erlene Quan [phone] beeper 02/24/2015, 2:40 PM    EP Attending  Patient seen and examined. Agree with above history, physical exam, assessment and plan. The patient has had VF as a consequence of his initial low output state. He is on amiodarone already. He has undergone right hearth demonstrating some hemodynamic improvement. I have reviewed the ICD interogation which reveals appropriate device therapy. Would continue amio for now and uptitrate/switch to IV amio if he has more arrhythmia. Prognosis is guarded. He has had LV dysfunction for a long time.   Mikle Bosworth.D.

## 2015-02-24 NOTE — CV Procedure (Signed)
Cardiac Cath Procedure Note:  Indication:  Heart failure/possible cardiogenic shock  Procedures performed:  1) Right heart catheterization 2) Placement of Swan -Ganz catheter for hemodynamic monitoring  Description of procedure:   The risks and indication of the procedure were explained. Consent was signed and placed on the chart. An appropriate timeout was taken prior to the procedure. The right neck was prepped and draped in the routine sterile fashion and anesthetized with 1% local lidocaine.   Under ultrasound guidance, A 7 FR venous sheath was placed in the right internal jugular vein using a modified Seldinger technique. A standard Swan-Ganz catheter was used for the procedure. Once the RHC was complete the distal tip of the catheter was positioned in the right PA and the sheath was sewn into place.   Complications: None apparent.  Findings:  RA = 9 RV = 46/3/12 PA =  47/18/29 PCW = 16 Fick cardiac output/index = 5.3/2.5 Thermo cardiac output/index = 5.3/2.5 PVR = 2.5 WU Ao sat = 99% PA sat = 57%, 60% SVC sat = 70%  Assessment:  Relatively normal hemodynamics.   Plan/Discussion:  Clinical course consistent with cardiogenic shock with hypotension, worsening renal function and rising lactate after ICD shock for VT. Fortunately hemodynamics now much improved. Will keep swan in overnight. Continue supportive care. Follow renal function closely.   Glori Bickers MD 5:08 PM

## 2015-02-24 NOTE — H&P (View-Only) (Signed)
DAILY PROGRESS NOTE  Subjective:  Tired, somewhat nauseated. No chest pain. Hypotensive with BP 76/47. BP meds being held. Little urine output - urine is dark. Creatinine is rising -now up to 3.5. Troponin is mildly elevated at 0.06- likely consistent with CHF.  White blood cell count is low today at 2.4. INR 1.85.  Objective:  Temp:  [97.7 F (36.5 C)-100.4 F (38 C)] 97.7 F (36.5 C) (03/21 0825) Pulse Rate:  [70-152] 73 (03/21 0900) Resp:  [11-29] 13 (03/21 0900) BP: (71-119)/(37-78) 79/49 mmHg (03/21 0900) SpO2:  [94 %-100 %] 100 % (03/21 0900) FiO2 (%):  [28 %] 28 % (03/20 2115) Weight:  [212 lb 15.4 oz (96.6 kg)-218 lb 4.1 oz (99 kg)] 218 lb 4.1 oz (99 kg) (03/21 0500) Weight change:   Intake/Output from previous day: 03/20 0701 - 03/21 0700 In: 120 [P.O.:120] Out: 1500 [Urine:1500]  Intake/Output from this shift: Total I/O In: 120 [P.O.:120] Out: -   Medications: Current Facility-Administered Medications  Medication Dose Route Frequency Provider Last Rate Last Dose  . 0.9 %  sodium chloride infusion  250 mL Intravenous PRN Samuella Cota, MD      . acetaminophen (TYLENOL) tablet 650 mg  650 mg Oral Q4H PRN Samuella Cota, MD      . amiodarone (PACERONE) tablet 200 mg  200 mg Oral Daily Samuella Cota, MD   200 mg at 02/24/15 0851  . antiseptic oral rinse (CPC / CETYLPYRIDINIUM CHLORIDE 0.05%) solution 7 mL  7 mL Mouth Rinse BID Samuella Cota, MD   7 mL at 02/24/15 1000  . apixaban (ELIQUIS) tablet 5 mg  5 mg Oral BID Samuella Cota, MD   5 mg at 02/24/15 0851  . digoxin (LANOXIN) tablet 125 mcg  125 mcg Oral QODAY Samuella Cota, MD   125 mcg at 02/23/15 1700  . fluticasone (FLONASE) 50 MCG/ACT nasal spray 1 spray  1 spray Each Nare Daily PRN Samuella Cota, MD      . insulin aspart (novoLOG) injection 0-15 Units  0-15 Units Subcutaneous TID WC Samuella Cota, MD   8 Units at 02/24/15 [phone]  . insulin aspart (novoLOG) injection 0-5 Units   0-5 Units Subcutaneous QHS Samuella Cota, MD   4 Units at 02/23/15 2139  . insulin glargine (LANTUS) injection 5 Units  5 Units Subcutaneous QHS Samuella Cota, MD   5 Units at 02/24/15 0032  . isosorbide dinitrate (ISORDIL) tablet 15 mg  15 mg Oral BID Jacolyn Reedy, MD   15 mg at 02/24/15 0012  . metoprolol succinate (TOPROL-XL) 24 hr tablet 25 mg  25 mg Oral BID Jacolyn Reedy, MD   Stopped at 02/23/15 2200  . nitroGLYCERIN (NITROLINGUAL) 0.4 MG/SPRAY spray 1 spray  1 spray Sublingual Q5 min PRN Samuella Cota, MD      . ondansetron [facility]) injection 4 mg  4 mg Intravenous Q6H PRN Jacolyn Reedy, MD   4 mg at 02/24/15 0229  . pantoprazole (PROTONIX) EC tablet 40 mg  40 mg Oral Daily Samuella Cota, MD   40 mg at 02/24/15 0851  . rosuvastatin (CRESTOR) tablet 40 mg  40 mg Oral QHS Samuella Cota, MD   40 mg at 02/24/15 0030  . sodium chloride 0.9 % injection 3 mL  3 mL Intravenous Q12H Samuella Cota, MD   3 mL at 02/24/15 1000  . sodium chloride 0.9 % injection 3 mL  3 mL Intravenous PRN Samuella Cota, MD        Physical Exam: General appearance: alert, no distress and appears somewhat somnolent, but oriented x 3 when awakened Neck: JVD - 6 cm above sternal notch and no carotid bruit Lungs: diminished breath sounds bilaterally Heart: regular rate and rhythm, S1, S2 normal, S3 present and systolic murmur: early systolic 2/6, blowing at apex Abdomen: soft, non-tender; bowel sounds normal; no masses,  no organomegaly and +AJr Extremities: edema trace in the hands bilaterally and top of both feet Pulses: 1+ bilaterally Skin: Skin color, texture, turgor normal. No rashes or lesions or skin warm Neurologic: Mental status: Alert, oriented, thought content appropriate Psych: Pleasant  Lab Results: Results for orders placed or performed during the hospital encounter of 02/23/15 (from the past 48 hour(s))  CBC WITH DIFFERENTIAL     Status: Abnormal   Collection  Time: 02/23/15 10:01 AM  Result Value Ref Range   WBC 9.9 4.0 - 10.5 K/uL   RBC 2.85 (L) 4.22 - 5.81 MIL/uL   Hemoglobin 8.7 (L) 13.0 - 17.0 g/dL   HCT 26.5 (L) 39.0 - 52.0 %   MCV 93.0 78.0 - 100.0 fL   MCH 30.5 26.0 - 34.0 pg   MCHC 32.8 30.0 - 36.0 g/dL   RDW 17.0 (H) 11.5 - 15.5 %   Platelets 123 (L) 150 - 400 K/uL   Neutrophils Relative % 94 (H) 43 - 77 %   Lymphocytes Relative 2 (L) 12 - 46 %   Monocytes Relative 4 3 - 12 %   Eosinophils Relative 0 0 - 5 %   Basophils Relative 0 0 - 1 %   Neutro Abs 9.3 (H) 1.7 - 7.7 K/uL   Lymphs Abs 0.2 (L) 0.7 - 4.0 K/uL   Monocytes Absolute 0.4 0.1 - 1.0 K/uL   Eosinophils Absolute 0.0 0.0 - 0.7 K/uL   Basophils Absolute 0.0 0.0 - 0.1 K/uL   RBC Morphology POLYCHROMASIA PRESENT    WBC Morphology INCREASED BANDS (>20% BANDS)     Comment: ATYPICAL LYMPHOCYTES SPECIMEN CHECKED FOR CLOTS    Smear Review PLATELET COUNT CONFIRMED BY SMEAR   Comprehensive metabolic panel     Status: Abnormal   Collection Time: 02/23/15 10:01 AM  Result Value Ref Range   Sodium 132 (L) 135 - 145 mmol/L   Potassium 3.4 (L) 3.5 - 5.1 mmol/L   Chloride 98 96 - 112 mmol/L   CO2 22 19 - 32 mmol/L   Glucose, Bld 357 (H) 70 - 99 mg/dL   BUN 29 (H) 6 - 23 mg/dL   Creatinine, Ser 2.96 (H) 0.50 - 1.35 mg/dL   Calcium 8.8 8.4 - 10.5 mg/dL   Total Protein 6.8 6.0 - 8.3 g/dL   Albumin 2.6 (L) 3.5 - 5.2 g/dL   AST 21 0 - 37 U/L   ALT 12 0 - 53 U/L   Alkaline Phosphatase 84 39 - 117 U/L   Total Bilirubin 1.7 (H) 0.3 - 1.2 mg/dL   GFR calc non Af Amer 22 (L) >90 mL/min   GFR calc Af Amer 25 (L) >90 mL/min    Comment: (NOTE) The eGFR has been calculated using the CKD EPI equation. This calculation has not been validated in all clinical situations. eGFR's persistently <90 mL/min signify possible Chronic Kidney Disease.    Anion gap 12 5 - 15  Troponin I (MHP)     Status: None   Collection Time: 02/23/15 10:01 AM  Result  Value Ref Range   Troponin I 0.03 <0.031  ng/mL    Comment:        NO INDICATION OF MYOCARDIAL INJURY.   Brain natriuretic peptide     Status: Abnormal   Collection Time: 02/23/15 10:01 AM  Result Value Ref Range   B Natriuretic Peptide 568.0 (H) 0.0 - 100.0 pg/mL  Digoxin level     Status: Abnormal   Collection Time: 02/23/15 10:01 AM  Result Value Ref Range   Digoxin Level 0.6 (L) 0.8 - 2.0 ng/mL  CBG monitoring, ED     Status: Abnormal   Collection Time: 02/23/15 10:27 AM  Result Value Ref Range   Glucose-Capillary 310 (H) 70 - 99 mg/dL  Urinalysis, Routine w reflex microscopic     Status: Abnormal   Collection Time: 02/23/15  2:35 PM  Result Value Ref Range   Color, Urine YELLOW YELLOW   APPearance HAZY (A) CLEAR   Specific Gravity, Urine 1.020 1.005 - 1.030   pH 5.5 5.0 - 8.0   Glucose, UA 500 (A) NEGATIVE mg/dL   Hgb urine dipstick NEGATIVE NEGATIVE   Bilirubin Urine NEGATIVE NEGATIVE   Ketones, ur NEGATIVE NEGATIVE mg/dL   Protein, ur TRACE (A) NEGATIVE mg/dL   Urobilinogen, UA 0.2 0.0 - 1.0 mg/dL   Nitrite NEGATIVE NEGATIVE   Leukocytes, UA NEGATIVE NEGATIVE  Urine microscopic-add on     Status: Abnormal   Collection Time: 02/23/15  2:35 PM  Result Value Ref Range   Squamous Epithelial / LPF RARE RARE   WBC, UA 3-6 <3 WBC/hpf   RBC / HPF 0-2 <3 RBC/hpf   Bacteria, UA MANY (A) RARE  MRSA PCR Screening     Status: None   Collection Time: 02/23/15  2:45 PM  Result Value Ref Range   MRSA by PCR NEGATIVE NEGATIVE    Comment:        The GeneXpert MRSA Assay (FDA approved for NASAL specimens only), is one component of a comprehensive MRSA colonization surveillance program. It is not intended to diagnose MRSA infection nor to guide or monitor treatment for MRSA infections.   Glucose, capillary     Status: Abnormal   Collection Time: 02/23/15  4:55 PM  Result Value Ref Range   Glucose-Capillary 382 (H) 70 - 99 mg/dL   Comment 1 Notify RN    Comment 2 Document in Chart   CBC with  Differential/Platelet     Status: Abnormal   Collection Time: 02/23/15  7:40 PM  Result Value Ref Range   WBC 2.4 (L) 4.0 - 10.5 K/uL   RBC 3.12 (L) 4.22 - 5.81 MIL/uL   Hemoglobin 9.5 (L) 13.0 - 17.0 g/dL   HCT 29.6 (L) 39.0 - 52.0 %   MCV 94.9 78.0 - 100.0 fL   MCH 30.4 26.0 - 34.0 pg   MCHC 32.1 30.0 - 36.0 g/dL   RDW 17.5 (H) 11.5 - 15.5 %   Platelets 148 (L) 150 - 400 K/uL   Neutrophils Relative % 88 (H) 43 - 77 %   Neutro Abs 2.1 1.7 - 7.7 K/uL   Lymphocytes Relative 11 (L) 12 - 46 %   Lymphs Abs 0.3 (L) 0.7 - 4.0 K/uL   Monocytes Relative 1 (L) 3 - 12 %   Monocytes Absolute 0.0 (L) 0.1 - 1.0 K/uL   Eosinophils Relative 0 0 - 5 %   Eosinophils Absolute 0.0 0.0 - 0.7 K/uL   Basophils Relative 0 0 - 1 %  Basophils Absolute 0.0 0.0 - 0.1 K/uL  Comprehensive metabolic panel     Status: Abnormal   Collection Time: 02/23/15  7:40 PM  Result Value Ref Range   Sodium 130 (L) 135 - 145 mmol/L   Potassium 4.0 3.5 - 5.1 mmol/L   Chloride 95 (L) 96 - 112 mmol/L   CO2 17 (L) 19 - 32 mmol/L   Glucose, Bld 395 (H) 70 - 99 mg/dL   BUN 31 (H) 6 - 23 mg/dL   Creatinine, Ser 3.29 (H) 0.50 - 1.35 mg/dL   Calcium 8.6 8.4 - 10.5 mg/dL   Total Protein 7.3 6.0 - 8.3 g/dL   Albumin 2.7 (L) 3.5 - 5.2 g/dL   AST 38 (H) 0 - 37 U/L   ALT <5 0 - 53 U/L    Comment: REPEATED TO VERIFY   Alkaline Phosphatase 95 39 - 117 U/L   Total Bilirubin 1.9 (H) 0.3 - 1.2 mg/dL   GFR calc non Af Amer 19 (L) >90 mL/min   GFR calc Af Amer 22 (L) >90 mL/min    Comment: (NOTE) The eGFR has been calculated using the CKD EPI equation. This calculation has not been validated in all clinical situations. eGFR's persistently <90 mL/min signify possible Chronic Kidney Disease.    Anion gap 18 (H) 5 - 15  Magnesium     Status: None   Collection Time: 02/23/15  7:40 PM  Result Value Ref Range   Magnesium 1.8 1.5 - 2.5 mg/dL  Phosphorus     Status: None   Collection Time: 02/23/15  7:40 PM  Result Value Ref Range     Phosphorus 4.3 2.3 - 4.6 mg/dL  Troponin I (q 6hr x 3)     Status: None   Collection Time: 02/23/15  7:40 PM  Result Value Ref Range   Troponin I 0.03 <0.031 ng/mL    Comment:        NO INDICATION OF MYOCARDIAL INJURY.   Protime-INR     Status: Abnormal   Collection Time: 02/23/15  7:40 PM  Result Value Ref Range   Prothrombin Time 21.5 (H) 11.6 - 15.2 seconds   INR 1.85 (H) 0.00 - 1.49  Lactic acid, plasma     Status: Abnormal   Collection Time: 02/23/15  7:40 PM  Result Value Ref Range   Lactic Acid, Venous 7.3 (HH) 0.5 - 2.0 mmol/L    Comment: CRITICAL RESULT CALLED TO, READ BACK BY AND VERIFIED WITH: DR.REDDY AT 2025 ON 02/23/15 BY MOSLEY,J   Glucose, capillary     Status: Abnormal   Collection Time: 02/23/15  7:42 PM  Result Value Ref Range   Glucose-Capillary 312 (H) 70 - 99 mg/dL   Comment 1 Notify RN   Glucose, capillary     Status: Abnormal   Collection Time: 02/23/15  9:38 PM  Result Value Ref Range   Glucose-Capillary 302 (H) 70 - 99 mg/dL   Comment 1 Notify RN   TSH     Status: None   Collection Time: 02/24/15  1:09 AM  Result Value Ref Range   TSH 3.025 0.350 - 4.500 uIU/mL  Basic metabolic panel     Status: Abnormal   Collection Time: 02/24/15  1:09 AM  Result Value Ref Range   Sodium 134 (L) 135 - 145 mmol/L   Potassium 4.4 3.5 - 5.1 mmol/L   Chloride 98 96 - 112 mmol/L   CO2 23 19 - 32 mmol/L   Glucose, Bld 292 (H)  70 - 99 mg/dL   BUN 30 (H) 6 - 23 mg/dL   Creatinine, Ser 3.50 (H) 0.50 - 1.35 mg/dL   Calcium 9.1 8.4 - 10.5 mg/dL   GFR calc non Af Amer 18 (L) >90 mL/min   GFR calc Af Amer 20 (L) >90 mL/min    Comment: (NOTE) The eGFR has been calculated using the CKD EPI equation. This calculation has not been validated in all clinical situations. eGFR's persistently <90 mL/min signify possible Chronic Kidney Disease.    Anion gap 13 5 - 15  Troponin I     Status: Abnormal   Collection Time: 02/24/15  1:16 AM  Result Value Ref Range    Troponin I 0.06 (H) <0.031 ng/mL    Comment:        PERSISTENTLY INCREASED TROPONIN VALUES IN THE RANGE OF 0.04-0.49 ng/mL CAN BE SEEN IN:       -UNSTABLE ANGINA       -CONGESTIVE HEART FAILURE       -MYOCARDITIS       -CHEST TRAUMA       -ARRYHTHMIAS       -LATE PRESENTING MYOCARDIAL INFARCTION       -COPD   CLINICAL FOLLOW-UP RECOMMENDED.   Glucose, capillary     Status: Abnormal   Collection Time: 02/24/15  8:27 AM  Result Value Ref Range   Glucose-Capillary 280 (H) 70 - 99 mg/dL    Imaging: Dg Chest 2 View  02/23/2015   CLINICAL DATA:  Shortness of breath and cough  EXAM: CHEST  2 VIEW  COMPARISON:  01/10/2015  FINDINGS: Cardiac shadow is enlarged but stable. A defibrillator is again seen and stable. The lungs are well-aerated without focal infiltrate or sizable effusion. No acute bony abnormality is seen.  IMPRESSION: No active cardiopulmonary disease.   Electronically Signed   By: Inez Catalina M.D.   On: 02/23/2015 10:30    Assessment:  Principal Problem:   Ventricular fibrillation Active Problems:   Ischemic cardiomyopathy   Long term current use of anticoagulant therapy   Hypotension   Chronic kidney disease, stage IV (severe)   DM type 2, uncontrolled, with renal complications   Orthostatic hypotension   OSA on CPAP   Acute on chronic systolic congestive heart failure   Acute on chronic combined systolic and diastolic congestive heart failure, NYHA class 4   Cardiogenic shock   Plan:  1. VF/polymorphic VT - my review of his rhythm strips shows VF or polymorphic VT - there appears to be ATP events, but I do not see evidence for defibrillation.  Have discussed with Dr. Lovena Le - will ask [employer] to interrogate the device today and EP to consult. He was hypomagnesemic initially - replete Mg to >2 and K+ to >4. 2. Acute on chronic systolic/diastolic congestive heart failure, NYHA Class IV - he appears to be in low output heart failure. He has been nauseated, vomiting  and has worsening renal function, which I suspect is not likely dehydration - neck veins appear elevated. EF recently was 18%.  BNP was not obtained. There is trace upper and lower extremity edema. CXR does not show pulmonary edema. Discussed with Dr. Haroldine Laws with advanced CHF service - will proceed with RHC. Based on filling pressure and cardiac output, will proceed with intropes/diuresis.  3. Cardiogenic shock - as per above - clinically stable at this point. Await RHC numbers and treat accordingly. 4. CKD IV - with AKI on CKD - likely due to  renal artery hypoperfusion - note that lactic acid level was elevated in the ER at 7.3. 5. Pancytopenia - low WBC count today, ?stress reaction versus occult infection. No fever, but is hypotensive. HCT low, but improved compared to yesterday. Platelet count is also increased today. 6. Chronic anticoagulation - on Eliquis (unfortunately had dose today) - INR also elevated at 1.85, ?passive hepatic congestion.  CRITICAL CARE:  The patient is critically ill with multi-organ system failure and requires high complexity decision making for assessment and support, frequent evaluation and titration of therapies, application of advanced monitoring technologies and extensive interpretation of multiple databases.  Time Spent Directly with Patient:  45 minutes  Length of Stay:  LOS: 1 day   Pixie Casino, MD, [facility] Attending Cardiologist [facility]  Josefine Fuhr C 02/24/2015, 11:19 AM

## 2015-02-24 NOTE — Progress Notes (Addendum)
DAILY PROGRESS NOTE  Subjective:  Tired, somewhat nauseated. No chest pain. Hypotensive with BP 76/47. BP meds being held. Little urine output - urine is dark. Creatinine is rising -now up to 3.5. Troponin is mildly elevated at 0.06- likely consistent with CHF.  White blood cell count is low today at 2.4. INR 1.85.  Objective:  Temp:  [97.7 F (36.5 C)-100.4 F (38 C)] 97.7 F (36.5 C) (03/21 0825) Pulse Rate:  [70-152] 73 (03/21 0900) Resp:  [11-29] 13 (03/21 0900) BP: (71-119)/(37-78) 79/49 mmHg (03/21 0900) SpO2:  [94 %-100 %] 100 % (03/21 0900) FiO2 (%):  [28 %] 28 % (03/20 2115) Weight:  [212 lb 15.4 oz (96.6 kg)-218 lb 4.1 oz (99 kg)] 218 lb 4.1 oz (99 kg) (03/21 0500) Weight change:   Intake/Output from previous day: 03/20 0701 - 03/21 0700 In: 120 [P.O.:120] Out: 1500 [Urine:1500]  Intake/Output from this shift: Total I/O In: 120 [P.O.:120] Out: -   Medications: Current Facility-Administered Medications  Medication Dose Route Frequency Provider Last Rate Last Dose  . 0.9 %  sodium chloride infusion  250 mL Intravenous PRN Samuella Cota, MD      . acetaminophen (TYLENOL) tablet 650 mg  650 mg Oral Q4H PRN Samuella Cota, MD      . amiodarone (PACERONE) tablet 200 mg  200 mg Oral Daily Samuella Cota, MD   200 mg at 02/24/15 0851  . antiseptic oral rinse (CPC / CETYLPYRIDINIUM CHLORIDE 0.05%) solution 7 mL  7 mL Mouth Rinse BID Samuella Cota, MD   7 mL at 02/24/15 1000  . apixaban (ELIQUIS) tablet 5 mg  5 mg Oral BID Samuella Cota, MD   5 mg at 02/24/15 0851  . digoxin (LANOXIN) tablet 125 mcg  125 mcg Oral QODAY Samuella Cota, MD   125 mcg at 02/23/15 1700  . fluticasone (FLONASE) 50 MCG/ACT nasal spray 1 spray  1 spray Each Nare Daily PRN Samuella Cota, MD      . insulin aspart (novoLOG) injection 0-15 Units  0-15 Units Subcutaneous TID WC Samuella Cota, MD   8 Units at 02/24/15 [phone]  . insulin aspart (novoLOG) injection 0-5 Units   0-5 Units Subcutaneous QHS Samuella Cota, MD   4 Units at 02/23/15 2139  . insulin glargine (LANTUS) injection 5 Units  5 Units Subcutaneous QHS Samuella Cota, MD   5 Units at 02/24/15 0032  . isosorbide dinitrate (ISORDIL) tablet 15 mg  15 mg Oral BID Jacolyn Reedy, MD   15 mg at 02/24/15 0012  . metoprolol succinate (TOPROL-XL) 24 hr tablet 25 mg  25 mg Oral BID Jacolyn Reedy, MD   Stopped at 02/23/15 2200  . nitroGLYCERIN (NITROLINGUAL) 0.4 MG/SPRAY spray 1 spray  1 spray Sublingual Q5 min PRN Samuella Cota, MD      . ondansetron [facility]) injection 4 mg  4 mg Intravenous Q6H PRN Jacolyn Reedy, MD   4 mg at 02/24/15 0229  . pantoprazole (PROTONIX) EC tablet 40 mg  40 mg Oral Daily Samuella Cota, MD   40 mg at 02/24/15 0851  . rosuvastatin (CRESTOR) tablet 40 mg  40 mg Oral QHS Samuella Cota, MD   40 mg at 02/24/15 0030  . sodium chloride 0.9 % injection 3 mL  3 mL Intravenous Q12H Samuella Cota, MD   3 mL at 02/24/15 1000  . sodium chloride 0.9 % injection 3 mL  3 mL Intravenous PRN Samuella Cota, MD        Physical Exam: General appearance: alert, no distress and appears somewhat somnolent, but oriented x 3 when awakened Neck: JVD - 6 cm above sternal notch and no carotid bruit Lungs: diminished breath sounds bilaterally Heart: regular rate and rhythm, S1, S2 normal, S3 present and systolic murmur: early systolic 2/6, blowing at apex Abdomen: soft, non-tender; bowel sounds normal; no masses,  no organomegaly and +AJr Extremities: edema trace in the hands bilaterally and top of both feet Pulses: 1+ bilaterally Skin: Skin color, texture, turgor normal. No rashes or lesions or skin warm Neurologic: Mental status: Alert, oriented, thought content appropriate Psych: Pleasant  Lab Results: Results for orders placed or performed during the hospital encounter of 02/23/15 (from the past 48 hour(s))  CBC WITH DIFFERENTIAL     Status: Abnormal   Collection  Time: 02/23/15 10:01 AM  Result Value Ref Range   WBC 9.9 4.0 - 10.5 K/uL   RBC 2.85 (L) 4.22 - 5.81 MIL/uL   Hemoglobin 8.7 (L) 13.0 - 17.0 g/dL   HCT 26.5 (L) 39.0 - 52.0 %   MCV 93.0 78.0 - 100.0 fL   MCH 30.5 26.0 - 34.0 pg   MCHC 32.8 30.0 - 36.0 g/dL   RDW 17.0 (H) 11.5 - 15.5 %   Platelets 123 (L) 150 - 400 K/uL   Neutrophils Relative % 94 (H) 43 - 77 %   Lymphocytes Relative 2 (L) 12 - 46 %   Monocytes Relative 4 3 - 12 %   Eosinophils Relative 0 0 - 5 %   Basophils Relative 0 0 - 1 %   Neutro Abs 9.3 (H) 1.7 - 7.7 K/uL   Lymphs Abs 0.2 (L) 0.7 - 4.0 K/uL   Monocytes Absolute 0.4 0.1 - 1.0 K/uL   Eosinophils Absolute 0.0 0.0 - 0.7 K/uL   Basophils Absolute 0.0 0.0 - 0.1 K/uL   RBC Morphology POLYCHROMASIA PRESENT    WBC Morphology INCREASED BANDS (>20% BANDS)     Comment: ATYPICAL LYMPHOCYTES SPECIMEN CHECKED FOR CLOTS    Smear Review PLATELET COUNT CONFIRMED BY SMEAR   Comprehensive metabolic panel     Status: Abnormal   Collection Time: 02/23/15 10:01 AM  Result Value Ref Range   Sodium 132 (L) 135 - 145 mmol/L   Potassium 3.4 (L) 3.5 - 5.1 mmol/L   Chloride 98 96 - 112 mmol/L   CO2 22 19 - 32 mmol/L   Glucose, Bld 357 (H) 70 - 99 mg/dL   BUN 29 (H) 6 - 23 mg/dL   Creatinine, Ser 2.96 (H) 0.50 - 1.35 mg/dL   Calcium 8.8 8.4 - 10.5 mg/dL   Total Protein 6.8 6.0 - 8.3 g/dL   Albumin 2.6 (L) 3.5 - 5.2 g/dL   AST 21 0 - 37 U/L   ALT 12 0 - 53 U/L   Alkaline Phosphatase 84 39 - 117 U/L   Total Bilirubin 1.7 (H) 0.3 - 1.2 mg/dL   GFR calc non Af Amer 22 (L) >90 mL/min   GFR calc Af Amer 25 (L) >90 mL/min    Comment: (NOTE) The eGFR has been calculated using the CKD EPI equation. This calculation has not been validated in all clinical situations. eGFR's persistently <90 mL/min signify possible Chronic Kidney Disease.    Anion gap 12 5 - 15  Troponin I (MHP)     Status: None   Collection Time: 02/23/15 10:01 AM  Result  Value Ref Range   Troponin I 0.03 <0.031  ng/mL    Comment:        NO INDICATION OF MYOCARDIAL INJURY.   Brain natriuretic peptide     Status: Abnormal   Collection Time: 02/23/15 10:01 AM  Result Value Ref Range   B Natriuretic Peptide 568.0 (H) 0.0 - 100.0 pg/mL  Digoxin level     Status: Abnormal   Collection Time: 02/23/15 10:01 AM  Result Value Ref Range   Digoxin Level 0.6 (L) 0.8 - 2.0 ng/mL  CBG monitoring, ED     Status: Abnormal   Collection Time: 02/23/15 10:27 AM  Result Value Ref Range   Glucose-Capillary 310 (H) 70 - 99 mg/dL  Urinalysis, Routine w reflex microscopic     Status: Abnormal   Collection Time: 02/23/15  2:35 PM  Result Value Ref Range   Color, Urine YELLOW YELLOW   APPearance HAZY (A) CLEAR   Specific Gravity, Urine 1.020 1.005 - 1.030   pH 5.5 5.0 - 8.0   Glucose, UA 500 (A) NEGATIVE mg/dL   Hgb urine dipstick NEGATIVE NEGATIVE   Bilirubin Urine NEGATIVE NEGATIVE   Ketones, ur NEGATIVE NEGATIVE mg/dL   Protein, ur TRACE (A) NEGATIVE mg/dL   Urobilinogen, UA 0.2 0.0 - 1.0 mg/dL   Nitrite NEGATIVE NEGATIVE   Leukocytes, UA NEGATIVE NEGATIVE  Urine microscopic-add on     Status: Abnormal   Collection Time: 02/23/15  2:35 PM  Result Value Ref Range   Squamous Epithelial / LPF RARE RARE   WBC, UA 3-6 <3 WBC/hpf   RBC / HPF 0-2 <3 RBC/hpf   Bacteria, UA MANY (A) RARE  MRSA PCR Screening     Status: None   Collection Time: 02/23/15  2:45 PM  Result Value Ref Range   MRSA by PCR NEGATIVE NEGATIVE    Comment:        The GeneXpert MRSA Assay (FDA approved for NASAL specimens only), is one component of a comprehensive MRSA colonization surveillance program. It is not intended to diagnose MRSA infection nor to guide or monitor treatment for MRSA infections.   Glucose, capillary     Status: Abnormal   Collection Time: 02/23/15  4:55 PM  Result Value Ref Range   Glucose-Capillary 382 (H) 70 - 99 mg/dL   Comment 1 Notify RN    Comment 2 Document in Chart   CBC with  Differential/Platelet     Status: Abnormal   Collection Time: 02/23/15  7:40 PM  Result Value Ref Range   WBC 2.4 (L) 4.0 - 10.5 K/uL   RBC 3.12 (L) 4.22 - 5.81 MIL/uL   Hemoglobin 9.5 (L) 13.0 - 17.0 g/dL   HCT 29.6 (L) 39.0 - 52.0 %   MCV 94.9 78.0 - 100.0 fL   MCH 30.4 26.0 - 34.0 pg   MCHC 32.1 30.0 - 36.0 g/dL   RDW 17.5 (H) 11.5 - 15.5 %   Platelets 148 (L) 150 - 400 K/uL   Neutrophils Relative % 88 (H) 43 - 77 %   Neutro Abs 2.1 1.7 - 7.7 K/uL   Lymphocytes Relative 11 (L) 12 - 46 %   Lymphs Abs 0.3 (L) 0.7 - 4.0 K/uL   Monocytes Relative 1 (L) 3 - 12 %   Monocytes Absolute 0.0 (L) 0.1 - 1.0 K/uL   Eosinophils Relative 0 0 - 5 %   Eosinophils Absolute 0.0 0.0 - 0.7 K/uL   Basophils Relative 0 0 - 1 %  Basophils Absolute 0.0 0.0 - 0.1 K/uL  Comprehensive metabolic panel     Status: Abnormal   Collection Time: 02/23/15  7:40 PM  Result Value Ref Range   Sodium 130 (L) 135 - 145 mmol/L   Potassium 4.0 3.5 - 5.1 mmol/L   Chloride 95 (L) 96 - 112 mmol/L   CO2 17 (L) 19 - 32 mmol/L   Glucose, Bld 395 (H) 70 - 99 mg/dL   BUN 31 (H) 6 - 23 mg/dL   Creatinine, Ser 3.29 (H) 0.50 - 1.35 mg/dL   Calcium 8.6 8.4 - 10.5 mg/dL   Total Protein 7.3 6.0 - 8.3 g/dL   Albumin 2.7 (L) 3.5 - 5.2 g/dL   AST 38 (H) 0 - 37 U/L   ALT <5 0 - 53 U/L    Comment: REPEATED TO VERIFY   Alkaline Phosphatase 95 39 - 117 U/L   Total Bilirubin 1.9 (H) 0.3 - 1.2 mg/dL   GFR calc non Af Amer 19 (L) >90 mL/min   GFR calc Af Amer 22 (L) >90 mL/min    Comment: (NOTE) The eGFR has been calculated using the CKD EPI equation. This calculation has not been validated in all clinical situations. eGFR's persistently <90 mL/min signify possible Chronic Kidney Disease.    Anion gap 18 (H) 5 - 15  Magnesium     Status: None   Collection Time: 02/23/15  7:40 PM  Result Value Ref Range   Magnesium 1.8 1.5 - 2.5 mg/dL  Phosphorus     Status: None   Collection Time: 02/23/15  7:40 PM  Result Value Ref Range     Phosphorus 4.3 2.3 - 4.6 mg/dL  Troponin I (q 6hr x 3)     Status: None   Collection Time: 02/23/15  7:40 PM  Result Value Ref Range   Troponin I 0.03 <0.031 ng/mL    Comment:        NO INDICATION OF MYOCARDIAL INJURY.   Protime-INR     Status: Abnormal   Collection Time: 02/23/15  7:40 PM  Result Value Ref Range   Prothrombin Time 21.5 (H) 11.6 - 15.2 seconds   INR 1.85 (H) 0.00 - 1.49  Lactic acid, plasma     Status: Abnormal   Collection Time: 02/23/15  7:40 PM  Result Value Ref Range   Lactic Acid, Venous 7.3 (HH) 0.5 - 2.0 mmol/L    Comment: CRITICAL RESULT CALLED TO, READ BACK BY AND VERIFIED WITH: DR.REDDY AT 2025 ON 02/23/15 BY MOSLEY,J   Glucose, capillary     Status: Abnormal   Collection Time: 02/23/15  7:42 PM  Result Value Ref Range   Glucose-Capillary 312 (H) 70 - 99 mg/dL   Comment 1 Notify RN   Glucose, capillary     Status: Abnormal   Collection Time: 02/23/15  9:38 PM  Result Value Ref Range   Glucose-Capillary 302 (H) 70 - 99 mg/dL   Comment 1 Notify RN   TSH     Status: None   Collection Time: 02/24/15  1:09 AM  Result Value Ref Range   TSH 3.025 0.350 - 4.500 uIU/mL  Basic metabolic panel     Status: Abnormal   Collection Time: 02/24/15  1:09 AM  Result Value Ref Range   Sodium 134 (L) 135 - 145 mmol/L   Potassium 4.4 3.5 - 5.1 mmol/L   Chloride 98 96 - 112 mmol/L   CO2 23 19 - 32 mmol/L   Glucose, Bld 292 (H)  70 - 99 mg/dL   BUN 30 (H) 6 - 23 mg/dL   Creatinine, Ser 3.50 (H) 0.50 - 1.35 mg/dL   Calcium 9.1 8.4 - 10.5 mg/dL   GFR calc non Af Amer 18 (L) >90 mL/min   GFR calc Af Amer 20 (L) >90 mL/min    Comment: (NOTE) The eGFR has been calculated using the CKD EPI equation. This calculation has not been validated in all clinical situations. eGFR's persistently <90 mL/min signify possible Chronic Kidney Disease.    Anion gap 13 5 - 15  Troponin I     Status: Abnormal   Collection Time: 02/24/15  1:16 AM  Result Value Ref Range    Troponin I 0.06 (H) <0.031 ng/mL    Comment:        PERSISTENTLY INCREASED TROPONIN VALUES IN THE RANGE OF 0.04-0.49 ng/mL CAN BE SEEN IN:       -UNSTABLE ANGINA       -CONGESTIVE HEART FAILURE       -MYOCARDITIS       -CHEST TRAUMA       -ARRYHTHMIAS       -LATE PRESENTING MYOCARDIAL INFARCTION       -COPD   CLINICAL FOLLOW-UP RECOMMENDED.   Glucose, capillary     Status: Abnormal   Collection Time: 02/24/15  8:27 AM  Result Value Ref Range   Glucose-Capillary 280 (H) 70 - 99 mg/dL    Imaging: Dg Chest 2 View  02/23/2015   CLINICAL DATA:  Shortness of breath and cough  EXAM: CHEST  2 VIEW  COMPARISON:  01/10/2015  FINDINGS: Cardiac shadow is enlarged but stable. A defibrillator is again seen and stable. The lungs are well-aerated without focal infiltrate or sizable effusion. No acute bony abnormality is seen.  IMPRESSION: No active cardiopulmonary disease.   Electronically Signed   By: Inez Catalina M.D.   On: 02/23/2015 10:30    Assessment:  Principal Problem:   Ventricular fibrillation Active Problems:   Ischemic cardiomyopathy   Long term current use of anticoagulant therapy   Hypotension   Chronic kidney disease, stage IV (severe)   DM type 2, uncontrolled, with renal complications   Orthostatic hypotension   OSA on CPAP   Acute on chronic systolic congestive heart failure   Acute on chronic combined systolic and diastolic congestive heart failure, NYHA class 4   Cardiogenic shock   Plan:  1. VF/polymorphic VT - my review of his rhythm strips shows VF or polymorphic VT - there appears to be ATP events, but I do not see evidence for defibrillation.  Have discussed with Dr. Lovena Le - will ask [employer] to interrogate the device today and EP to consult. He was hypomagnesemic initially - replete Mg to >2 and K+ to >4. 2. Acute on chronic systolic/diastolic congestive heart failure, NYHA Class IV - he appears to be in low output heart failure. He has been nauseated, vomiting  and has worsening renal function, which I suspect is not likely dehydration - neck veins appear elevated. EF recently was 18%.  BNP was not obtained. There is trace upper and lower extremity edema. CXR does not show pulmonary edema. Discussed with Dr. Haroldine Laws with advanced CHF service - will proceed with RHC. Based on filling pressure and cardiac output, will proceed with intropes/diuresis.  3. Cardiogenic shock - as per above - clinically stable at this point. Await RHC numbers and treat accordingly. 4. CKD IV - with AKI on CKD - likely due to  renal artery hypoperfusion - note that lactic acid level was elevated in the ER at 7.3. 5. Pancytopenia - low WBC count today, ?stress reaction versus occult infection. No fever, but is hypotensive. HCT low, but improved compared to yesterday. Platelet count is also increased today. 6. Chronic anticoagulation - on Eliquis (unfortunately had dose today) - INR also elevated at 1.85, ?passive hepatic congestion.  CRITICAL CARE:  The patient is critically ill with multi-organ system failure and requires high complexity decision making for assessment and support, frequent evaluation and titration of therapies, application of advanced monitoring technologies and extensive interpretation of multiple databases.  Time Spent Directly with Patient:  45 minutes  Length of Stay:  LOS: 1 day   Pixie Casino, MD, [facility] Attending Cardiologist [facility]  HILTY,Kenneth C 02/24/2015, 11:19 AM

## 2015-02-25 ENCOUNTER — Encounter (HOSPITAL_COMMUNITY): Payer: Self-pay | Admitting: Internal Medicine

## 2015-02-25 DIAGNOSIS — I4901 Ventricular fibrillation: Secondary | ICD-10-CM

## 2015-02-25 DIAGNOSIS — R570 Cardiogenic shock: Secondary | ICD-10-CM

## 2015-02-25 LAB — BASIC METABOLIC PANEL
Anion gap: 10 (ref 5–15)
BUN: 42 mg/dL — AB (ref 6–23)
CHLORIDE: 97 mmol/L (ref 96–112)
CO2: 25 mmol/L (ref 19–32)
Calcium: 8.4 mg/dL (ref 8.4–10.5)
Creatinine, Ser: 3.38 mg/dL — ABNORMAL HIGH (ref 0.50–1.35)
GFR calc Af Amer: 21 mL/min — ABNORMAL LOW (ref >90)
GFR calc non Af Amer: 18 mL/min — ABNORMAL LOW (ref >90)
GLUCOSE: 222 mg/dL — AB (ref 70–99)
Potassium: 3.6 mmol/L (ref 3.5–5.1)
SODIUM: 132 mmol/L — AB (ref 135–145)

## 2015-02-25 LAB — GLUCOSE, CAPILLARY
GLUCOSE-CAPILLARY: 199 mg/dL — AB (ref 70–99)
GLUCOSE-CAPILLARY: 205 mg/dL — AB (ref 70–99)
GLUCOSE-CAPILLARY: 230 mg/dL — AB (ref 70–99)
Glucose-Capillary: 227 mg/dL — ABNORMAL HIGH (ref 70–99)
Glucose-Capillary: 325 mg/dL — ABNORMAL HIGH (ref 70–99)

## 2015-02-25 LAB — CARBOXYHEMOGLOBIN
CARBOXYHEMOGLOBIN: 1.8 % — AB (ref 0.5–1.5)
Methemoglobin: 1.1 % (ref 0.0–1.5)
O2 SAT: 61.3 %
Total hemoglobin: 7.6 g/dL — ABNORMAL LOW (ref 13.5–18.0)

## 2015-02-25 MED ORDER — INSULIN GLARGINE 100 UNIT/ML ~~LOC~~ SOLN
10.0000 [IU] | Freq: Every day | SUBCUTANEOUS | Status: DC
  Administered 2015-02-26 – 2015-02-27 (×2): 10 [IU] via SUBCUTANEOUS
  Filled 2015-02-25 (×2): qty 0.1

## 2015-02-25 NOTE — Progress Notes (Signed)
Inpatient Diabetes Program Recommendations  AACE/ADA: New Consensus Statement on Inpatient Glycemic Control (2013)  Target Ranges:  Prepandial:   less than 140 mg/dL      Peak postprandial:   less than 180 mg/dL (1-2 hours)      Critically ill patients:  140 - 180 mg/dL  Results for [name], [name] (MRN [id number]) as of 02/25/2015 08:50  Ref. Range 02/24/2015 08:27 02/24/2015 12:34 02/24/2015 17:44 02/24/2015 21:19 02/25/2015 08:11  Glucose-Capillary Latest Range: 70-99 mg/dL 280 (H) 244 (H) 199 (H) 227 (H) 205 (H)   Recommend increasing Lantus to total 15 units daily.  Currently 5 units HS ordered.  Would add morning dose 10 units to start today. Thank you  Raoul Pitch BSN, RN,CDE Inpatient Diabetes Coordinator [phone] (team pager)

## 2015-02-25 NOTE — Progress Notes (Signed)
DAILY PROGRESS NOTE  Subjective:  [name] is a 59yo man with PMHx of CAD with prior stenting to the LAD, type 2 DM, HTN, PAF, chronic systolic heart failure (EF 25-30%) on echo 02/24/15), and ischemic cardiomyopathy with biventricular ICD who presented with symptoms of acute dyspnea, dizziness, tachycardia, and hypotension found to have ventricular fibrillation and hemodynamics consistent with cardiogenic shock.   He underwent right heart cath on 02/24/15. Results showed his hemodynamics had improved.   EP evaluated patient and recommend to continue amio and to switch to IV amio if arrhythmia continues.   Today, renal function has mildly improved. Baseline Cr ~3.0, Renal US on 02/21/14 shows decreased kidney size (9.9 cm right, 9.2 cm left). Weight down 5 lbs. CO-OX 61%. CVP 8. Reports mild dyspnea overnight that required 2 L oxygen via [place]. He is breathing comfortably now. Denies chest pain, palpitations. Also reports dizziness when standing up.   [name] numbers done personally: CVP 8 PA 52/18 (33) PCWP 19 Thermo CO/CI 5.6/2.6 SVR 830 PVR  2.3 WU  Objective:  Temp:  [97.6 F (36.4 C)-101.7 F (38.7 C)] 97.7 F (36.5 C) (03/22 0803) Pulse Rate:  [72-77] 74 (03/22 0904) Resp:  [10-23] 17 (03/22 0803) BP: (70-119)/(36-62) 106/60 mmHg (03/22 0904) SpO2:  [90 %-100 %] 99 % (03/22 0803) Weight:  [212 lb 8.4 oz (96.4 kg)-217 lb 9.5 oz (98.7 kg)] 212 lb 8.4 oz (96.4 kg) (03/22 0500) Weight change: 6 lb 9.5 oz (2.991 kg)  Intake/Output from previous day: 03/21 0701 - 03/22 0700 In: 960.5 [P.O.:840; I.V.:120.5] Out: 750 [Urine:750]  Intake/Output from this shift: Total I/O In: 10 [I.V.:10] Out: -   Medications: Current Facility-Administered Medications  Medication Dose Route Frequency Provider Last Rate Last Dose  . 0.9 %  sodium chloride infusion  250 mL Intravenous PRN Jolaine Artist, MD 10 mL/hr at 02/25/15 0700 250 mL at 02/25/15 0700  . acetaminophen (TYLENOL) tablet  650 mg  650 mg Oral Q4H PRN Jolaine Artist, MD   650 mg at 02/25/15 0248  . amiodarone (PACERONE) tablet 200 mg  200 mg Oral Daily Samuella Cota, MD   200 mg at 02/25/15 0905  . antiseptic oral rinse (CPC / CETYLPYRIDINIUM CHLORIDE 0.05%) solution 7 mL  7 mL Mouth Rinse BID Samuella Cota, MD   7 mL at 02/24/15 2200  . apixaban (ELIQUIS) tablet 5 mg  5 mg Oral BID Jolaine Artist, MD   5 mg at 02/25/15 5366  . digoxin (LANOXIN) tablet 125 mcg  125 mcg Oral QODAY Samuella Cota, MD   125 mcg at 02/25/15 [phone]  . fluticasone (FLONASE) 50 MCG/ACT nasal spray 1 spray  1 spray Each Nare Daily PRN Samuella Cota, MD      . insulin aspart (novoLOG) injection 0-15 Units  0-15 Units Subcutaneous TID WC Samuella Cota, MD   5 Units at 02/25/15 0800  . insulin aspart (novoLOG) injection 0-5 Units  0-5 Units Subcutaneous QHS Samuella Cota, MD   2 Units at 02/24/15 2126  . insulin glargine (LANTUS) injection 5 Units  5 Units Subcutaneous QHS Samuella Cota, MD   5 Units at 02/24/15 2200  . isosorbide dinitrate (ISORDIL) tablet 15 mg  15 mg Oral BID Jacolyn Reedy, MD   15 mg at 02/25/15 4742  . metoprolol succinate (TOPROL-XL) 24 hr tablet 25 mg  25 mg Oral BID Jacolyn Reedy, MD   25 mg at 02/25/15 5956  .  nitroGLYCERIN (NITROLINGUAL) 0.4 MG/SPRAY spray 1 spray  1 spray Sublingual Q5 min PRN Samuella Cota, MD      . ondansetron [facility]) injection 4 mg  4 mg Intravenous Q6H PRN Jolaine Artist, MD      . pantoprazole (PROTONIX) EC tablet 40 mg  40 mg Oral Daily Samuella Cota, MD   40 mg at 02/25/15 0905  . rosuvastatin (CRESTOR) tablet 40 mg  40 mg Oral QHS Samuella Cota, MD   40 mg at 02/24/15 2126  . sodium chloride 0.9 % injection 3 mL  3 mL Intravenous Q12H Jolaine Artist, MD   3 mL at 02/25/15 0601  . sodium chloride 0.9 % injection 3 mL  3 mL Intravenous PRN Jolaine Artist, MD        Physical Exam: General: sitting up in bed, NAD HEENT: [place]/AT,  EOMI, mucus membranes moist Neck: supple, RIJ swan  CV: RRR, no m/g/r Pulm: CTA bilaterally, breathing comfortably on 2L oxygen via [place] Abd: BS+, soft, obese, non-tender, colostomy in LLQ Ext: warm, no edema, moves all Neuro: alert and oriented x 3, no focal deficits   Lab Results: Results for orders placed or performed during the hospital encounter of 02/23/15 (from the past 48 hour(s))  CBC WITH DIFFERENTIAL     Status: Abnormal   Collection Time: 02/23/15 10:01 AM  Result Value Ref Range   WBC 9.9 4.0 - 10.5 K/uL   RBC 2.85 (L) 4.22 - 5.81 MIL/uL   Hemoglobin 8.7 (L) 13.0 - 17.0 g/dL   HCT 26.5 (L) 39.0 - 52.0 %   MCV 93.0 78.0 - 100.0 fL   MCH 30.5 26.0 - 34.0 pg   MCHC 32.8 30.0 - 36.0 g/dL   RDW 17.0 (H) 11.5 - 15.5 %   Platelets 123 (L) 150 - 400 K/uL   Neutrophils Relative % 94 (H) 43 - 77 %   Lymphocytes Relative 2 (L) 12 - 46 %   Monocytes Relative 4 3 - 12 %   Eosinophils Relative 0 0 - 5 %   Basophils Relative 0 0 - 1 %   Neutro Abs 9.3 (H) 1.7 - 7.7 K/uL   Lymphs Abs 0.2 (L) 0.7 - 4.0 K/uL   Monocytes Absolute 0.4 0.1 - 1.0 K/uL   Eosinophils Absolute 0.0 0.0 - 0.7 K/uL   Basophils Absolute 0.0 0.0 - 0.1 K/uL   RBC Morphology POLYCHROMASIA PRESENT    WBC Morphology INCREASED BANDS (>20% BANDS)     Comment: ATYPICAL LYMPHOCYTES SPECIMEN CHECKED FOR CLOTS    Smear Review PLATELET COUNT CONFIRMED BY SMEAR   Comprehensive metabolic panel     Status: Abnormal   Collection Time: 02/23/15 10:01 AM  Result Value Ref Range   Sodium 132 (L) 135 - 145 mmol/L   Potassium 3.4 (L) 3.5 - 5.1 mmol/L   Chloride 98 96 - 112 mmol/L   CO2 22 19 - 32 mmol/L   Glucose, Bld 357 (H) 70 - 99 mg/dL   BUN 29 (H) 6 - 23 mg/dL   Creatinine, Ser 2.96 (H) 0.50 - 1.35 mg/dL   Calcium 8.8 8.4 - 10.5 mg/dL   Total Protein 6.8 6.0 - 8.3 g/dL   Albumin 2.6 (L) 3.5 - 5.2 g/dL   AST 21 0 - 37 U/L   ALT 12 0 - 53 U/L   Alkaline Phosphatase 84 39 - 117 U/L   Total Bilirubin 1.7 (H) 0.3 -  1.2 mg/dL   GFR calc  non Af Amer 22 (L) >90 mL/min   GFR calc Af Amer 25 (L) >90 mL/min    Comment: (NOTE) The eGFR has been calculated using the CKD EPI equation. This calculation has not been validated in all clinical situations. eGFR's persistently <90 mL/min signify possible Chronic Kidney Disease.    Anion gap 12 5 - 15  Troponin I (MHP)     Status: None   Collection Time: 02/23/15 10:01 AM  Result Value Ref Range   Troponin I 0.03 <0.031 ng/mL    Comment:        NO INDICATION OF MYOCARDIAL INJURY.   Brain natriuretic peptide     Status: Abnormal   Collection Time: 02/23/15 10:01 AM  Result Value Ref Range   B Natriuretic Peptide 568.0 (H) 0.0 - 100.0 pg/mL  Digoxin level     Status: Abnormal   Collection Time: 02/23/15 10:01 AM  Result Value Ref Range   Digoxin Level 0.6 (L) 0.8 - 2.0 ng/mL  CBG monitoring, ED     Status: Abnormal   Collection Time: 02/23/15 10:27 AM  Result Value Ref Range   Glucose-Capillary 310 (H) 70 - 99 mg/dL  Urinalysis, Routine w reflex microscopic     Status: Abnormal   Collection Time: 02/23/15  2:35 PM  Result Value Ref Range   Color, Urine YELLOW YELLOW   APPearance HAZY (A) CLEAR   Specific Gravity, Urine 1.020 1.005 - 1.030   pH 5.5 5.0 - 8.0   Glucose, UA 500 (A) NEGATIVE mg/dL   Hgb urine dipstick NEGATIVE NEGATIVE   Bilirubin Urine NEGATIVE NEGATIVE   Ketones, ur NEGATIVE NEGATIVE mg/dL   Protein, ur TRACE (A) NEGATIVE mg/dL   Urobilinogen, UA 0.2 0.0 - 1.0 mg/dL   Nitrite NEGATIVE NEGATIVE   Leukocytes, UA NEGATIVE NEGATIVE  Urine microscopic-add on     Status: Abnormal   Collection Time: 02/23/15  2:35 PM  Result Value Ref Range   Squamous Epithelial / LPF RARE RARE   WBC, UA 3-6 <3 WBC/hpf   RBC / HPF 0-2 <3 RBC/hpf   Bacteria, UA MANY (A) RARE  MRSA PCR Screening     Status: None   Collection Time: 02/23/15  2:45 PM  Result Value Ref Range   MRSA by PCR NEGATIVE NEGATIVE    Comment:        The GeneXpert MRSA Assay  (FDA approved for NASAL specimens only), is one component of a comprehensive MRSA colonization surveillance program. It is not intended to diagnose MRSA infection nor to guide or monitor treatment for MRSA infections.   Glucose, capillary     Status: Abnormal   Collection Time: 02/23/15  4:55 PM  Result Value Ref Range   Glucose-Capillary 382 (H) 70 - 99 mg/dL   Comment 1 Notify RN    Comment 2 Document in Chart   CBC with Differential/Platelet     Status: Abnormal   Collection Time: 02/23/15  7:40 PM  Result Value Ref Range   WBC 2.4 (L) 4.0 - 10.5 K/uL   RBC 3.12 (L) 4.22 - 5.81 MIL/uL   Hemoglobin 9.5 (L) 13.0 - 17.0 g/dL   HCT 29.6 (L) 39.0 - 52.0 %   MCV 94.9 78.0 - 100.0 fL   MCH 30.4 26.0 - 34.0 pg   MCHC 32.1 30.0 - 36.0 g/dL   RDW 17.5 (H) 11.5 - 15.5 %   Platelets 148 (L) 150 - 400 K/uL   Neutrophils Relative % 88 (H) 43 - 77 %  Neutro Abs 2.1 1.7 - 7.7 K/uL   Lymphocytes Relative 11 (L) 12 - 46 %   Lymphs Abs 0.3 (L) 0.7 - 4.0 K/uL   Monocytes Relative 1 (L) 3 - 12 %   Monocytes Absolute 0.0 (L) 0.1 - 1.0 K/uL   Eosinophils Relative 0 0 - 5 %   Eosinophils Absolute 0.0 0.0 - 0.7 K/uL   Basophils Relative 0 0 - 1 %   Basophils Absolute 0.0 0.0 - 0.1 K/uL  Comprehensive metabolic panel     Status: Abnormal   Collection Time: 02/23/15  7:40 PM  Result Value Ref Range   Sodium 130 (L) 135 - 145 mmol/L   Potassium 4.0 3.5 - 5.1 mmol/L   Chloride 95 (L) 96 - 112 mmol/L   CO2 17 (L) 19 - 32 mmol/L   Glucose, Bld 395 (H) 70 - 99 mg/dL   BUN 31 (H) 6 - 23 mg/dL   Creatinine, Ser 3.29 (H) 0.50 - 1.35 mg/dL   Calcium 8.6 8.4 - 10.5 mg/dL   Total Protein 7.3 6.0 - 8.3 g/dL   Albumin 2.7 (L) 3.5 - 5.2 g/dL   AST 38 (H) 0 - 37 U/L   ALT <5 0 - 53 U/L    Comment: REPEATED TO VERIFY   Alkaline Phosphatase 95 39 - 117 U/L   Total Bilirubin 1.9 (H) 0.3 - 1.2 mg/dL   GFR calc non Af Amer 19 (L) >90 mL/min   GFR calc Af Amer 22 (L) >90 mL/min    Comment: (NOTE) The  eGFR has been calculated using the CKD EPI equation. This calculation has not been validated in all clinical situations. eGFR's persistently <90 mL/min signify possible Chronic Kidney Disease.    Anion gap 18 (H) 5 - 15  Magnesium     Status: None   Collection Time: 02/23/15  7:40 PM  Result Value Ref Range   Magnesium 1.8 1.5 - 2.5 mg/dL  Phosphorus     Status: None   Collection Time: 02/23/15  7:40 PM  Result Value Ref Range   Phosphorus 4.3 2.3 - 4.6 mg/dL  Troponin I (q 6hr x 3)     Status: None   Collection Time: 02/23/15  7:40 PM  Result Value Ref Range   Troponin I 0.03 <0.031 ng/mL    Comment:        NO INDICATION OF MYOCARDIAL INJURY.   Protime-INR     Status: Abnormal   Collection Time: 02/23/15  7:40 PM  Result Value Ref Range   Prothrombin Time 21.5 (H) 11.6 - 15.2 seconds   INR 1.85 (H) 0.00 - 1.49  Lactic acid, plasma     Status: Abnormal   Collection Time: 02/23/15  7:40 PM  Result Value Ref Range   Lactic Acid, Venous 7.3 (HH) 0.5 - 2.0 mmol/L    Comment: CRITICAL RESULT CALLED TO, READ BACK BY AND VERIFIED WITH: DR.REDDY AT 2025 ON 02/23/15 BY MOSLEY,J   Glucose, capillary     Status: Abnormal   Collection Time: 02/23/15  7:42 PM  Result Value Ref Range   Glucose-Capillary 312 (H) 70 - 99 mg/dL   Comment 1 Notify RN   Glucose, capillary     Status: Abnormal   Collection Time: 02/23/15  9:38 PM  Result Value Ref Range   Glucose-Capillary 302 (H) 70 - 99 mg/dL   Comment 1 Notify RN   TSH     Status: None   Collection Time: 02/24/15  1:09 AM  Result Value Ref Range   TSH 3.025 0.350 - 4.500 uIU/mL  Basic metabolic panel     Status: Abnormal   Collection Time: 02/24/15  1:09 AM  Result Value Ref Range   Sodium 134 (L) 135 - 145 mmol/L   Potassium 4.4 3.5 - 5.1 mmol/L   Chloride 98 96 - 112 mmol/L   CO2 23 19 - 32 mmol/L   Glucose, Bld 292 (H) 70 - 99 mg/dL   BUN 30 (H) 6 - 23 mg/dL   Creatinine, Ser 3.50 (H) 0.50 - 1.35 mg/dL   Calcium 9.1 8.4 -  10.5 mg/dL   GFR calc non Af Amer 18 (L) >90 mL/min   GFR calc Af Amer 20 (L) >90 mL/min    Comment: (NOTE) The eGFR has been calculated using the CKD EPI equation. This calculation has not been validated in all clinical situations. eGFR's persistently <90 mL/min signify possible Chronic Kidney Disease.    Anion gap 13 5 - 15  Troponin I     Status: Abnormal   Collection Time: 02/24/15  1:16 AM  Result Value Ref Range   Troponin I 0.06 (H) <0.031 ng/mL    Comment:        PERSISTENTLY INCREASED TROPONIN VALUES IN THE RANGE OF 0.04-0.49 ng/mL CAN BE SEEN IN:       -UNSTABLE ANGINA       -CONGESTIVE HEART FAILURE       -MYOCARDITIS       -CHEST TRAUMA       -ARRYHTHMIAS       -LATE PRESENTING MYOCARDIAL INFARCTION       -COPD   CLINICAL FOLLOW-UP RECOMMENDED.   Glucose, capillary     Status: Abnormal   Collection Time: 02/24/15  8:27 AM  Result Value Ref Range   Glucose-Capillary 280 (H) 70 - 99 mg/dL  Glucose, capillary     Status: Abnormal   Collection Time: 02/24/15 12:34 PM  Result Value Ref Range   Glucose-Capillary 244 (H) 70 - 99 mg/dL  I-STAT 3, venous blood gas (G3P V)     Status: Abnormal   Collection Time: 02/24/15  4:39 PM  Result Value Ref Range   pH, Ven 7.406 (H) 7.250 - 7.300   pCO2, Ven 38.8 (L) 45.0 - 50.0 mmHg   pO2, Ven 30.0 30.0 - 45.0 mmHg   Bicarbonate 24.4 (H) 20.0 - 24.0 mEq/L   TCO2 26 0 - 100 mmol/L   O2 Saturation 57.0 %   Sample type VENOUS    Comment NOTIFIED PHYSICIAN   I-STAT 3, venous blood gas (G3P V)     Status: Abnormal   Collection Time: 02/24/15  4:40 PM  Result Value Ref Range   pH, Ven 7.388 (H) 7.250 - 7.300   pCO2, Ven 38.1 (L) 45.0 - 50.0 mmHg   pO2, Ven 31.0 30.0 - 45.0 mmHg   Bicarbonate 23.0 20.0 - 24.0 mEq/L   TCO2 24 0 - 100 mmol/L   O2 Saturation 60.0 %   Acid-base deficit 2.0 0.0 - 2.0 mmol/L   Sample type VENOUS    Comment NOTIFIED PHYSICIAN   I-STAT 3, venous blood gas (G3P V)     Status: Abnormal   Collection  Time: 02/24/15  4:47 PM  Result Value Ref Range   pH, Ven 7.374 (H) 7.250 - 7.300   pCO2, Ven 41.0 (L) 45.0 - 50.0 mmHg   pO2, Ven 38.0 30.0 - 45.0 mmHg   Bicarbonate 23.9 20.0 - 24.0 mEq/L  TCO2 25 0 - 100 mmol/L   O2 Saturation 71.0 %   Acid-base deficit 1.0 0.0 - 2.0 mmol/L   Sample type VENOUS    Comment NOTIFIED PHYSICIAN   Glucose, capillary     Status: Abnormal   Collection Time: 02/24/15  5:44 PM  Result Value Ref Range   Glucose-Capillary 199 (H) 70 - 99 mg/dL   Comment 1 Capillary Specimen   Glucose, capillary     Status: Abnormal   Collection Time: 02/24/15  9:19 PM  Result Value Ref Range   Glucose-Capillary 227 (H) 70 - 99 mg/dL  Basic metabolic panel     Status: Abnormal   Collection Time: 02/25/15  4:40 AM  Result Value Ref Range   Sodium 132 (L) 135 - 145 mmol/L   Potassium 3.6 3.5 - 5.1 mmol/L    Comment: DELTA CHECK NOTED   Chloride 97 96 - 112 mmol/L   CO2 25 19 - 32 mmol/L   Glucose, Bld 222 (H) 70 - 99 mg/dL   BUN 42 (H) 6 - 23 mg/dL   Creatinine, Ser 3.38 (H) 0.50 - 1.35 mg/dL   Calcium 8.4 8.4 - 10.5 mg/dL   GFR calc non Af Amer 18 (L) >90 mL/min   GFR calc Af Amer 21 (L) >90 mL/min    Comment: (NOTE) The eGFR has been calculated using the CKD EPI equation. This calculation has not been validated in all clinical situations. eGFR's persistently <90 mL/min signify possible Chronic Kidney Disease.    Anion gap 10 5 - 15  Carboxyhemoglobin     Status: Abnormal   Collection Time: 02/25/15  4:50 AM  Result Value Ref Range   Total hemoglobin 7.6 (L) 13.5 - 18.0 g/dL   O2 Saturation 61.3 %   Carboxyhemoglobin 1.8 (H) 0.5 - 1.5 %   Methemoglobin 1.1 0.0 - 1.5 %  Glucose, capillary     Status: Abnormal   Collection Time: 02/25/15  8:11 AM  Result Value Ref Range   Glucose-Capillary 205 (H) 70 - 99 mg/dL    Imaging: Dg Chest 2 View  02/23/2015   CLINICAL DATA:  Shortness of breath and cough  EXAM: CHEST  2 VIEW  COMPARISON:  01/10/2015  FINDINGS:  Cardiac shadow is enlarged but stable. A defibrillator is again seen and stable. The lungs are well-aerated without focal infiltrate or sizable effusion. No acute bony abnormality is seen.  IMPRESSION: No active cardiopulmonary disease.   Electronically Signed   By: Inez Catalina M.D.   On: 02/23/2015 10:30    Assessment: 1. Acute on chronic systolic HF with biventricular ICD 2. Cardiogenic shock - resolved 3. Ventricular fibrillation 4. Acute on chronic CKD Stage 4 5. Ischemic cardiomyopathy 6. CAD with stents to LAD 7. Paroxysmal AFib 8. Type 2 DM   Plan: Right cath reassuring that hemodynamics have improved. Will continue metoprolol 25 mg BID, digoxin 125 mcg every other day, and isordil 15 mg BID. Volume looks good- no edema on exam. CVP 8. Co-ox 61%. Cr improving, will monitor closely. Continue amio for arrhythmia.    Length of Stay:  LOS: 2 days   Robert Felling, MD PGY-1  Patient seen and examined with Dr. Arcelia Jew. We discussed all aspects of the encounter. I agree with the assessment and plan as stated above.   He is much improved today. Hemodynamics essentially normal. Shock has resolved. Suspect had VF first then developed cardiogenic shock. Now recovered on his own. No further ectopy. EP managing anti-arrhythmics. Will  pull swan. Renal function better. Baseline creatinine about 3.o range with significant renal atrophy on u/s suggesting severe intrinsic renal disease. Has been followed at [facility] and not candidate for transplant. Given CKD also probably not candidate for VAD. Continue current HF meds. Can transfer to SDU after swan out. CR to see for ambulation. Probably home 24-48 hours.   The patient is critically ill with multiple organ systems failure and requires high complexity decision making for assessment and support, frequent evaluation and titration of therapies, application of advanced monitoring technologies and extensive interpretation of multiple databases.    Critical Care Time devoted to patient care services described in this note is 35 Minutes.  Robert Bensimhon,MD 11:04 AM

## 2015-02-25 NOTE — Progress Notes (Signed)
Pt AAOx4. Swan-ganz catheter removed. Cordis left in place. Sterile technique maintained throughout. Cordis site cleaned and new bio patch placed. Pt tolerated procedure well. Will continue to monitor closely.

## 2015-02-26 ENCOUNTER — Ambulatory Visit: Payer: 59 | Admitting: Cardiology

## 2015-02-26 LAB — BASIC METABOLIC PANEL
Anion gap: 6 (ref 5–15)
BUN: 38 mg/dL — ABNORMAL HIGH (ref 6–23)
CHLORIDE: 99 mmol/L (ref 96–112)
CO2: 25 mmol/L (ref 19–32)
CREATININE: 2.88 mg/dL — AB (ref 0.50–1.35)
Calcium: 8.2 mg/dL — ABNORMAL LOW (ref 8.4–10.5)
GFR calc non Af Amer: 22 mL/min — ABNORMAL LOW (ref >90)
GFR, EST AFRICAN AMERICAN: 26 mL/min — AB (ref >90)
GLUCOSE: 202 mg/dL — AB (ref 70–99)
POTASSIUM: 3.5 mmol/L (ref 3.5–5.1)
Sodium: 130 mmol/L — ABNORMAL LOW (ref 135–145)

## 2015-02-26 LAB — MAGNESIUM: MAGNESIUM: 2.1 mg/dL (ref 1.5–2.5)

## 2015-02-26 LAB — GLUCOSE, CAPILLARY
GLUCOSE-CAPILLARY: 192 mg/dL — AB (ref 70–99)
GLUCOSE-CAPILLARY: 170 mg/dL — AB (ref 70–99)
Glucose-Capillary: 269 mg/dL — ABNORMAL HIGH (ref 70–99)
Glucose-Capillary: 231 mg/dL — ABNORMAL HIGH (ref 70–99)
Glucose-Capillary: 267 mg/dL — ABNORMAL HIGH (ref 70–99)

## 2015-02-26 LAB — CARBOXYHEMOGLOBIN
Carboxyhemoglobin: 1.1 % (ref 0.5–1.5)
Methemoglobin: 0.9 % (ref 0.0–1.5)
O2 SAT: 61.6 %
Total hemoglobin: 13.2 g/dL — ABNORMAL LOW (ref 13.5–18.0)

## 2015-02-26 MED ORDER — ZOLPIDEM TARTRATE 5 MG PO TABS
5.0000 mg | ORAL_TABLET | Freq: Every evening | ORAL | Status: DC | PRN
  Administered 2015-02-26 (×2): 5 mg via ORAL
  Filled 2015-02-26 (×2): qty 1

## 2015-02-26 MED ORDER — GUAIFENESIN-DM 100-10 MG/5ML PO SYRP
15.0000 mL | ORAL_SOLUTION | ORAL | Status: DC | PRN
  Administered 2015-02-26 (×2): 15 mL via ORAL
  Filled 2015-02-26 (×2): qty 15

## 2015-02-26 MED ORDER — POTASSIUM CHLORIDE CRYS ER 20 MEQ PO TBCR
40.0000 meq | EXTENDED_RELEASE_TABLET | Freq: Once | ORAL | Status: AC
  Administered 2015-02-26: 40 meq via ORAL
  Filled 2015-02-26: qty 2

## 2015-02-26 NOTE — Progress Notes (Signed)
[facility]  Patient Status: Active (receiving services up to time of hospitalization)  [facility] is providing the following services: RN  If patient discharges after hours, please call [phone].   Janae Sauce 02/26/2015, 10:12 AM

## 2015-02-26 NOTE — Progress Notes (Signed)
DAILY PROGRESS NOTE  Subjective:  [name] is a 59yo man with PMHx of CAD with prior stenting to the LAD, type 2 DM, HTN, PAF, chronic systolic heart failure (EF 25-30%) on echo 02/24/15), and ischemic cardiomyopathy with biventricular ICD who presented with symptoms of acute dyspnea, dizziness, tachycardia, and hypotension found to have ventricular fibrillation and hemodynamics consistent with cardiogenic shock.   He underwent right heart cath on 02/24/15. Results showed his hemodynamics had improved.   EP evaluated patient and recommend to continue amio and to switch to IV amio if arrhythmia continues.   Yesterday swan was removed and he transferred to SDU. Todays CO-OX 61% . Mild dizziness standing.   RHC 02/24/2015 RA = 9 RV = 46/3/12 PA = 47/18/29 PCW = 16 Fick cardiac output/index = 5.3/2.5 Thermo cardiac output/index = 5.3/2.5 PVR = 2.5 WU Ao sat = 99% PA sat = 57%, 60% SVC sat = 70%  ECHO 02/24/2015: EF 25-30%   Objective:  Temp:  [97.8 F (36.6 C)-98.5 F (36.9 C)] 97.9 F (36.6 C) (03/23 1518) Pulse Rate:  [73-77] 73 (03/23 1518) Resp:  [16-23] 23 (03/23 1518) BP: (92-111)/(49-62) 106/52 mmHg (03/23 1518) SpO2:  [94 %-100 %] 98 % (03/23 1518) Weight:  [213 lb 13.5 oz (97 kg)] 213 lb 13.5 oz (97 kg) (03/23 0138) Weight change: -3 lb 12 oz (-1.7 kg)  Intake/Output from previous day: 03/22 0701 - 03/23 0700 In: 490 [P.O.:250; I.V.:240] Out: 2600 [Urine:2600]  Intake/Output from this shift: Total I/O In: -  Out: 950 [Urine:950]  Medications: Current Facility-Administered Medications  Medication Dose Route Frequency Provider Last Rate Last Dose  . 0.9 %  sodium chloride infusion  250 mL Intravenous PRN Jolaine Artist, MD 10 mL/hr at 02/25/15 2305 250 mL at 02/25/15 2305  . acetaminophen (TYLENOL) tablet 650 mg  650 mg Oral Q4H PRN Jolaine Artist, MD   650 mg at 02/25/15 1806  . amiodarone (PACERONE) tablet 200 mg  200 mg Oral Daily Samuella Cota, MD   200 mg at 02/26/15 5885  . antiseptic oral rinse (CPC / CETYLPYRIDINIUM CHLORIDE 0.05%) solution 7 mL  7 mL Mouth Rinse BID Samuella Cota, MD   7 mL at 02/26/15 0941  . apixaban (ELIQUIS) tablet 5 mg  5 mg Oral BID Jolaine Artist, MD   5 mg at 02/26/15 0277  . digoxin (LANOXIN) tablet 125 mcg  125 mcg Oral QODAY Samuella Cota, MD   125 mcg at 02/25/15 [phone]  . fluticasone (FLONASE) 50 MCG/ACT nasal spray 1 spray  1 spray Each Nare Daily PRN Samuella Cota, MD      . guaiFENesin-dextromethorphan [facility] DM) 100-10 MG/5ML syrup 15 mL  15 mL Oral Q4H PRN Arnoldo Lenis, MD   15 mL at 02/26/15 1231  . insulin aspart (novoLOG) injection 0-15 Units  0-15 Units Subcutaneous TID WC Samuella Cota, MD   5 Units at 02/26/15 1300  . insulin aspart (novoLOG) injection 0-5 Units  0-5 Units Subcutaneous QHS Samuella Cota, MD   3 Units at 02/25/15 2152  . insulin glargine (LANTUS) injection 10 Units  10 Units Subcutaneous Daily Larey Dresser, MD   10 Units at 02/26/15 [phone]  . insulin glargine (LANTUS) injection 5 Units  5 Units Subcutaneous QHS Samuella Cota, MD   5 Units at 02/25/15 2254  . isosorbide dinitrate (ISORDIL) tablet 15 mg  15 mg Oral BID Jacolyn Reedy, MD  15 mg at 02/26/15 0938  . metoprolol succinate (TOPROL-XL) 24 hr tablet 25 mg  25 mg Oral BID Jacolyn Reedy, MD   25 mg at 02/26/15 [phone]  . nitroGLYCERIN (NITROLINGUAL) 0.4 MG/SPRAY spray 1 spray  1 spray Sublingual Q5 min PRN Samuella Cota, MD      . ondansetron [facility]) injection 4 mg  4 mg Intravenous Q6H PRN Jolaine Artist, MD      . pantoprazole (PROTONIX) EC tablet 40 mg  40 mg Oral Daily Samuella Cota, MD   40 mg at 02/26/15 7672  . rosuvastatin (CRESTOR) tablet 40 mg  40 mg Oral QHS Samuella Cota, MD   40 mg at 02/25/15 2100  . sodium chloride 0.9 % injection 3 mL  3 mL Intravenous Q12H Jolaine Artist, MD   3 mL at 02/26/15 0947  . sodium chloride 0.9 % injection 3  mL  3 mL Intravenous PRN Jolaine Artist, MD      . zolpidem (AMBIEN) tablet 5 mg  5 mg Oral QHS PRN Sueanne Margarita, MD   5 mg at 02/26/15 0045    Physical Exam: General: sitting up in bed, NAD HEENT: [place]/AT, EOMI, mucus membranes moist Neck: supple, RIJ swan  CV: RRR, no m/g/r Pulm: CTA bilaterally, breathing comfortably on 2L oxygen via [place] Abd: BS+, soft, obese, non-tender, colostomy in LLQ Ext: warm, no edema, moves all Neuro: alert and oriented x 3, no focal deficits   Lab Results: Results for orders placed or performed during the hospital encounter of 02/23/15 (from the past 48 hour(s))  Glucose, capillary     Status: Abnormal   Collection Time: 02/24/15  5:44 PM  Result Value Ref Range   Glucose-Capillary 199 (H) 70 - 99 mg/dL   Comment 1 Capillary Specimen   Glucose, capillary     Status: Abnormal   Collection Time: 02/24/15  9:19 PM  Result Value Ref Range   Glucose-Capillary 227 (H) 70 - 99 mg/dL  Basic metabolic panel     Status: Abnormal   Collection Time: 02/25/15  4:40 AM  Result Value Ref Range   Sodium 132 (L) 135 - 145 mmol/L   Potassium 3.6 3.5 - 5.1 mmol/L    Comment: DELTA CHECK NOTED   Chloride 97 96 - 112 mmol/L   CO2 25 19 - 32 mmol/L   Glucose, Bld 222 (H) 70 - 99 mg/dL   BUN 42 (H) 6 - 23 mg/dL   Creatinine, Ser 3.38 (H) 0.50 - 1.35 mg/dL   Calcium 8.4 8.4 - 10.5 mg/dL   GFR calc non Af Amer 18 (L) >90 mL/min   GFR calc Af Amer 21 (L) >90 mL/min    Comment: (NOTE) The eGFR has been calculated using the CKD EPI equation. This calculation has not been validated in all clinical situations. eGFR's persistently <90 mL/min signify possible Chronic Kidney Disease.    Anion gap 10 5 - 15  Carboxyhemoglobin     Status: Abnormal   Collection Time: 02/25/15  4:50 AM  Result Value Ref Range   Total hemoglobin 7.6 (L) 13.5 - 18.0 g/dL   O2 Saturation 61.3 %   Carboxyhemoglobin 1.8 (H) 0.5 - 1.5 %   Methemoglobin 1.1 0.0 - 1.5 %  Glucose, capillary      Status: Abnormal   Collection Time: 02/25/15  8:11 AM  Result Value Ref Range   Glucose-Capillary 205 (H) 70 - 99 mg/dL  Glucose, capillary  Status: Abnormal   Collection Time: 02/25/15 11:38 AM  Result Value Ref Range   Glucose-Capillary 230 (H) 70 - 99 mg/dL   Comment 1 Capillary Specimen   Glucose, capillary     Status: Abnormal   Collection Time: 02/25/15  4:33 PM  Result Value Ref Range   Glucose-Capillary 325 (H) 70 - 99 mg/dL   Comment 1 Capillary Specimen   Glucose, capillary     Status: Abnormal   Collection Time: 02/25/15  9:35 PM  Result Value Ref Range   Glucose-Capillary 269 (H) 70 - 99 mg/dL   Comment 1 Capillary Specimen   Basic metabolic panel     Status: Abnormal   Collection Time: 02/26/15  3:50 AM  Result Value Ref Range   Sodium 130 (L) 135 - 145 mmol/L   Potassium 3.5 3.5 - 5.1 mmol/L   Chloride 99 96 - 112 mmol/L   CO2 25 19 - 32 mmol/L   Glucose, Bld 202 (H) 70 - 99 mg/dL   BUN 38 (H) 6 - 23 mg/dL   Creatinine, Ser 2.88 (H) 0.50 - 1.35 mg/dL   Calcium 8.2 (L) 8.4 - 10.5 mg/dL   GFR calc non Af Amer 22 (L) >90 mL/min   GFR calc Af Amer 26 (L) >90 mL/min    Comment: (NOTE) The eGFR has been calculated using the CKD EPI equation. This calculation has not been validated in all clinical situations. eGFR's persistently <90 mL/min signify possible Chronic Kidney Disease.    Anion gap 6 5 - 15  Carboxyhemoglobin     Status: Abnormal   Collection Time: 02/26/15  4:04 AM  Result Value Ref Range   Total hemoglobin 13.2 (L) 13.5 - 18.0 g/dL   O2 Saturation 61.6 %   Carboxyhemoglobin 1.1 0.5 - 1.5 %   Methemoglobin 0.9 0.0 - 1.5 %  Glucose, capillary     Status: Abnormal   Collection Time: 02/26/15  8:11 AM  Result Value Ref Range   Glucose-Capillary 192 (H) 70 - 99 mg/dL   Comment 1 Notify RN   Glucose, capillary     Status: Abnormal   Collection Time: 02/26/15 12:34 PM  Result Value Ref Range   Glucose-Capillary 231 (H) 70 - 99 mg/dL  Glucose,  capillary     Status: Abnormal   Collection Time: 02/26/15  4:29 PM  Result Value Ref Range   Glucose-Capillary 267 (H) 70 - 99 mg/dL   Comment 1 Capillary Specimen     Imaging: No results found.  Assessment: 1. Acute on chronic systolic HF with biventricular ICD 2. Cardiogenic shock - resolved 3. Ventricular fibrillation 4. Acute on chronic CKD Stage 4 5. Ischemic cardiomyopathy 6. CAD with stents to LAD 7. Paroxysmal AFib 8. Type 2 DM 9. Hypokalemia/hyponatremia  [name],[name]  5:01 PM   Plan:  Patient seen and examined with Darrick Grinder, NP. We discussed all aspects of the encounter. I agree with the assessment and plan as stated above.   Doing very well. Volume status looks good. Renal function improved. Co-ox looks good. No further VT/VF. Continue amio 200 bid.   Will supp K+. Check Mag. Pull venous sheath and send to tele. Hopefully home in am  Benay Spice 5:32 PM

## 2015-02-26 NOTE — Progress Notes (Signed)
Patient came to the floor at [place], alert oriented x4, denies pain, no shortness of breath, v/s stable. Will continue to monitor patient.

## 2015-02-26 NOTE — Progress Notes (Signed)
Patient continues to refuse the CPAP machine. Patient is currently resting comfortably, in no apparent distress. No machine in room at this time, however patient is aware to call RT at any time if he does change his mind and wants to wear the machine. RT will continue to assist as needed.

## 2015-02-27 LAB — BASIC METABOLIC PANEL WITH GFR
Anion gap: 10 (ref 5–15)
BUN: 34 mg/dL — ABNORMAL HIGH (ref 6–23)
CO2: 22 mmol/L (ref 19–32)
Calcium: 8.6 mg/dL (ref 8.4–10.5)
Chloride: 100 mmol/L (ref 96–112)
Creatinine, Ser: 2.78 mg/dL — ABNORMAL HIGH (ref 0.50–1.35)
GFR calc Af Amer: 27 mL/min — ABNORMAL LOW
GFR calc non Af Amer: 23 mL/min — ABNORMAL LOW
Glucose, Bld: 212 mg/dL — ABNORMAL HIGH (ref 70–99)
Potassium: 3.8 mmol/L (ref 3.5–5.1)
Sodium: 132 mmol/L — ABNORMAL LOW (ref 135–145)

## 2015-02-27 LAB — GLUCOSE, CAPILLARY
Glucose-Capillary: 180 mg/dL — ABNORMAL HIGH (ref 70–99)
Glucose-Capillary: 188 mg/dL — ABNORMAL HIGH (ref 70–99)

## 2015-02-27 LAB — MAGNESIUM: MAGNESIUM: 2 mg/dL (ref 1.5–2.5)

## 2015-02-27 MED ORDER — AMIODARONE HCL 200 MG PO TABS: 200.0000 mg | ORAL_TABLET | Freq: Two times a day (BID) | ORAL | Status: DC

## 2015-02-27 MED ORDER — AMIODARONE HCL 200 MG PO TABS
200.0000 mg | ORAL_TABLET | Freq: Two times a day (BID) | ORAL | Status: DC
  Administered 2015-02-27: 200 mg via ORAL
  Filled 2015-02-27 (×2): qty 1

## 2015-02-27 MED ORDER — RANOLAZINE ER 500 MG PO TB12
500.0000 mg | ORAL_TABLET | Freq: Two times a day (BID) | ORAL | Status: DC
  Administered 2015-02-27: 500 mg via ORAL
  Filled 2015-02-27 (×2): qty 1

## 2015-02-27 MED ORDER — RANOLAZINE ER 500 MG PO TB12: 500.0000 mg | ORAL_TABLET | Freq: Two times a day (BID) | ORAL | Status: DC

## 2015-02-27 NOTE — Progress Notes (Signed)
Discharge instructions given and explained to pt and pt's wife.  Both verbalize understanding of all orders/instructions and deny any questions at this time.  IV removed and site is CDI.  VSS. Pt discharged to home with all belongings via wheelchair volunteer services. Graceann Congress

## 2015-02-27 NOTE — Discharge Summary (Signed)
Advanced Heart Failure Team  Discharge Summary   Patient ID: [name] MRN: [id number], DOB/AGE: 1956-02-08 59 y.o. Admit date: 02/23/2015 D/C date:     02/27/2015   Primary Discharge Diagnoses:  1. Acute on chronic systolic HF with biventricular ICD 2. Cardiogenic shock - resolved 3. Ventricular fibrillation 4. Acute on chronic CKD Stage 4 5. Ischemic cardiomyopathy 6. CAD with stents to LAD 7. Paroxysmal AFib 8. Type 2 DM 9. Hypokalemia/hyponatremia  Hospital Course:  [name] is a 59yo man with PMHx of CAD with prior stenting to the LAD, type 2 DM, HTN, PAF, chronic systolic heart failure (EF 25-30%) on echo 02/24/15), and ischemic cardiomyopathy with biventricular ICD who presented with symptoms of acute dyspnea, dizziness, tachycardia, and hypotension found to have ventricular fibrillation and hemodynamics consistent with cardiogenic shock.   Initially he presented to [facility] and developed a cardiorespiratory arrest and CODE BLUE was called for VF.  He had one ICD shock and regained consciousness after 2 minutes of  being bagged. He was given 2 g of magnesium and 40 meq of potassium. Lactic acid was elevated and he was transferred to [facility]. He was already on amiodarone so this was increased to 200 mg twice a day  EP consulted with recommendations to continue po amiodarone as he had no further VF/VT while hospitalized.Initially felt to be in cardiogenic shock. He underwent RHC on March 21 that showed improved hemodynamics and did not require inotropes. Potassium and magnesium followed closely and supplemented as needed.   Renal function followed closely creatinine up on admit but back down to the his baseline when discharged. He will continue to be followed in the HF clinic and has follow up April 4 th. Plan to check BMET and Mag at office visit. At the time of discharge he was deemed stable for discharge.   RHC 02/24/2015 RA = 9 RV = 46/3/12 PA = 47/18/29 PCW =  16 Fick cardiac output/index = 5.3/2.5 Thermo cardiac output/index = 5.3/2.5 PVR = 2.5 WU Ao sat = 99% PA sat = 57%, 60% SVC sat = 70%  ECHO 02/24/2015: EF 25-30%    Discharge Weight Range:  211 pounds.  Discharge Vitals: Blood pressure 99/59, pulse 73, temperature 97.6 F (36.4 C), temperature source Oral, resp. rate 20, height 5\' 10"  (1.778 m), weight 211 lb 11.2 oz (96.026 kg), SpO2 100 %.  Labs: Lab Results  Component Value Date   WBC 2.4* 02/23/2015   HGB 9.5* 02/23/2015   HCT 29.6* 02/23/2015   MCV 94.9 02/23/2015   PLT 148* 02/23/2015    Recent Labs Lab 02/23/15 1940  02/27/15 0935  NA 130*  < > 132*  K 4.0  < > 3.8  CL 95*  < > 100  CO2 17*  < > 22  BUN 31*  < > 34*  CREATININE 3.29*  < > 2.78*  CALCIUM 8.6  < > 8.6  PROT 7.3  --   --   BILITOT 1.9*  --   --   ALKPHOS 95  --   --   ALT <5  --   --   AST 38*  --   --   GLUCOSE 395*  < > 212*  < > = values in this interval not displayed. Lab Results  Component Value Date   CHOL 186 11/20/2014   HDL 27* 11/20/2014   LDLCALC 123* 11/20/2014   TRIG 180* 11/20/2014   BNP (last 3 results)  Recent Labs  12/24/14 0414 02/23/15 1001  BNP 331.0* 568.0*    ProBNP (last 3 results)  Recent Labs  07/18/14 0555 09/27/14 0523 11/19/14 0745  PROBNP 1116.0* 9164.0* 5233.0*     Diagnostic Studies/Procedures   No results found.  Discharge Medications     Medication List    TAKE these medications        acetaminophen 500 MG tablet  Commonly known as:  TYLENOL  Take 1,000 mg by mouth every 6 (six) hours as needed for moderate pain.     amiodarone 200 MG tablet  Commonly known as:  PACERONE  Take 1 tablet (200 mg total) by mouth 2 (two) times daily.     co-enzyme Q-10 50 MG capsule  Take 50 mg by mouth every morning.     digoxin 0.125 MG tablet  Commonly known as:  LANOXIN  Take 1 tablet by mouth every other day.     ELIQUIS 5 MG Tabs tablet  Generic drug:  apixaban  Take 5 mg by mouth  2 (two) times daily.     Fish Oil 1200 MG Caps  Take 1,200 mg by mouth 2 (two) times daily.     fluticasone 50 MCG/ACT nasal spray  Commonly known as:  FLONASE  Place 1 spray into both nostrils daily as needed for allergies.     furosemide 40 MG tablet  Commonly known as:  LASIX  Take 1 tablet (40 mg total) by mouth daily as needed.     HYDROcodone-acetaminophen 5-325 MG per tablet  Commonly known as:  NORCO/VICODIN  Take 1-2 tablets by mouth every 6 (six) hours as needed.     isosorbide dinitrate 30 MG tablet  Commonly known as:  ISORDIL  Take 15 mg by mouth 2 (two) times daily.     meclizine 25 MG tablet  Commonly known as:  ANTIVERT  Take 25 mg by mouth as needed for dizziness.     metoprolol succinate 25 MG 24 hr tablet  Commonly known as:  TOPROL-XL  Take 25 mg by mouth 2 (two) times daily.     nitroGLYCERIN 0.4 MG/SPRAY spray  Commonly known as:  NITROLINGUAL  Place 1 spray under the tongue every 5 (five) minutes as needed. angina     pantoprazole 40 MG tablet  Commonly known as:  PROTONIX  Take 1 tablet (40 mg total) by mouth daily.     ranolazine 500 MG 12 hr tablet  Commonly known as:  RANEXA  Take 1 tablet (500 mg total) by mouth 2 (two) times daily.     rosuvastatin 40 MG tablet  Commonly known as:  CRESTOR  Take 1 tablet (40 mg total) by mouth at bedtime.        Disposition   The patient will be discharged in stable condition to home.     Discharge Instructions    Contraindication to ACEI at discharge    Complete by:  As directed      Diet - low sodium heart healthy    Complete by:  As directed      Heart Failure patients record your daily weight using the same scale at the same time of day    Complete by:  As directed      Increase activity slowly    Complete by:  As directed           Follow-up Information    Follow up with Arvilla Meres, MD On 03/10/2015.   Specialty:  Cardiology   Why:  at 3:40pm in the Advanced Heart Failure  Clinic--gate code 0007--please bring all medications   Contact information:   [address] [place] [address] [phone]         Duration of Discharge Encounter: Greater than 35 minutes   Signed, CLEGG,AMY NP-C  02/27/2015, 4:33 PM  Patient seen and examined with Tonye Becket, NP. We discussed all aspects of the encounter. I agree with the assessment and plan as stated above.   Patient developed cardiogenic shock in setting of VF and ICD shock. Recovered spontaneously. RHC improved. No need for inotropes. Followed at [facility] (Dr. Devonne Doughty)  for possible advanced therapies but likely not candidate due to CKD.  Adain Geurin,MD 12:31 AM

## 2015-02-27 NOTE — Progress Notes (Signed)
DAILY PROGRESS NOTE  Subjective:  [name] is a 59yo man with PMHx of CAD with prior stenting to the LAD, type 2 DM, HTN, PAF, chronic systolic heart failure (EF 25-30%) on echo 02/24/15), and ischemic cardiomyopathy with biventricular ICD who presented with symptoms of acute dyspnea, dizziness, tachycardia, and hypotension found to have ventricular fibrillation and hemodynamics consistent with cardiogenic shock.   He underwent right heart cath on 02/24/15. Results showed his hemodynamics had improved.   EP evaluated patient and recommend to continue amio and to switch to IV amio if arrhythmia continues.   Yesterday he transferred to telemetry. Todays CO-OX 61% . Denies SOB.   Creatinine 3.38>2.8>pending.   RHC 02/24/2015 RA = 9 RV = 46/3/12 PA = 47/18/29 PCW = 16 Fick cardiac output/index = 5.3/2.5 Thermo cardiac output/index = 5.3/2.5 PVR = 2.5 WU Ao sat = 99% PA sat = 57%, 60% SVC sat = 70%  ECHO 02/24/2015: EF 25-30%   Objective:  Temp:  [97.1 F (36.2 C)-98.5 F (36.9 C)] 97.6 F (36.4 C) (03/24 0555) Pulse Rate:  [70-76] 76 (03/24 0555) Resp:  [18-23] 20 (03/24 0555) BP: (101-115)/(49-70) 115/70 mmHg (03/24 0555) SpO2:  [96 %-100 %] 100 % (03/24 0555) Weight:  [211 lb 11.2 oz (96.026 kg)-213 lb 13.5 oz (97 kg)] 211 lb 11.2 oz (96.026 kg) (03/24 0555) Weight change: 0 lb (0 kg)  Intake/Output from previous day: 03/23 0701 - 03/24 0700 In: 1270 [P.O.:1270] Out: 2550 [Urine:2550]  Intake/Output from this shift: Total I/O In: -  Out: 400 [Urine:400]  Medications: Current Facility-Administered Medications  Medication Dose Route Frequency Provider Last Rate Last Dose  . 0.9 %  sodium chloride infusion  250 mL Intravenous PRN Jolaine Artist, MD 10 mL/hr at 02/25/15 2305 250 mL at 02/25/15 2305  . acetaminophen (TYLENOL) tablet 650 mg  650 mg Oral Q4H PRN Jolaine Artist, MD   650 mg at 02/25/15 1806  . amiodarone (PACERONE) tablet 200 mg  200 mg Oral  Daily Samuella Cota, MD   200 mg at 02/26/15 9211  . antiseptic oral rinse (CPC / CETYLPYRIDINIUM CHLORIDE 0.05%) solution 7 mL  7 mL Mouth Rinse BID Samuella Cota, MD   7 mL at 02/26/15 2200  . apixaban (ELIQUIS) tablet 5 mg  5 mg Oral BID Jolaine Artist, MD   5 mg at 02/26/15 2349  . digoxin (LANOXIN) tablet 125 mcg  125 mcg Oral QODAY Samuella Cota, MD   125 mcg at 02/25/15 [phone]  . fluticasone (FLONASE) 50 MCG/ACT nasal spray 1 spray  1 spray Each Nare Daily PRN Samuella Cota, MD      . guaiFENesin-dextromethorphan [facility] DM) 100-10 MG/5ML syrup 15 mL  15 mL Oral Q4H PRN Arnoldo Lenis, MD   15 mL at 02/26/15 1704  . insulin aspart (novoLOG) injection 0-15 Units  0-15 Units Subcutaneous TID WC Samuella Cota, MD   3 Units at 02/27/15 [phone]  . insulin aspart (novoLOG) injection 0-5 Units  0-5 Units Subcutaneous QHS Samuella Cota, MD   3 Units at 02/25/15 2152  . insulin glargine (LANTUS) injection 10 Units  10 Units Subcutaneous Daily Larey Dresser, MD   10 Units at 02/26/15 [phone]  . insulin glargine (LANTUS) injection 5 Units  5 Units Subcutaneous QHS Samuella Cota, MD   5 Units at 02/26/15 2352  . isosorbide dinitrate (ISORDIL) tablet 15 mg  15 mg Oral BID Jacolyn Reedy, MD  15 mg at 02/26/15 2351  . metoprolol succinate (TOPROL-XL) 24 hr tablet 25 mg  25 mg Oral BID Jacolyn Reedy, MD   25 mg at 02/26/15 2351  . nitroGLYCERIN (NITROLINGUAL) 0.4 MG/SPRAY spray 1 spray  1 spray Sublingual Q5 min PRN Samuella Cota, MD      . ondansetron [facility]) injection 4 mg  4 mg Intravenous Q6H PRN Jolaine Artist, MD      . pantoprazole (PROTONIX) EC tablet 40 mg  40 mg Oral Daily Samuella Cota, MD   40 mg at 02/26/15 7867  . rosuvastatin (CRESTOR) tablet 40 mg  40 mg Oral QHS Samuella Cota, MD   40 mg at 02/26/15 2350  . sodium chloride 0.9 % injection 3 mL  3 mL Intravenous Q12H Jolaine Artist, MD   3 mL at 02/26/15 2357  . sodium chloride 0.9  % injection 3 mL  3 mL Intravenous PRN Jolaine Artist, MD      . zolpidem (AMBIEN) tablet 5 mg  5 mg Oral QHS PRN Sueanne Margarita, MD   5 mg at 02/26/15 2352    Physical Exam: General: sitting up in bed, NAD HEENT: [place]/AT, EOMI, mucus membranes moist Neck: supple CV: RRR, no m/g/r Pulm: CTA bilaterally, breathing comfortably on room air.  Abd: BS+, soft, obese, non-tender, colostomy in LLQ Ext: warm, no edema, moves all Neuro: alert and oriented x 3, no focal deficits   Lab Results: Results for orders placed or performed during the hospital encounter of 02/23/15 (from the past 48 hour(s))  Glucose, capillary     Status: Abnormal   Collection Time: 02/25/15 11:38 AM  Result Value Ref Range   Glucose-Capillary 230 (H) 70 - 99 mg/dL   Comment 1 Capillary Specimen   Glucose, capillary     Status: Abnormal   Collection Time: 02/25/15  4:33 PM  Result Value Ref Range   Glucose-Capillary 325 (H) 70 - 99 mg/dL   Comment 1 Capillary Specimen   Glucose, capillary     Status: Abnormal   Collection Time: 02/25/15  9:35 PM  Result Value Ref Range   Glucose-Capillary 269 (H) 70 - 99 mg/dL   Comment 1 Capillary Specimen   Basic metabolic panel     Status: Abnormal   Collection Time: 02/26/15  3:50 AM  Result Value Ref Range   Sodium 130 (L) 135 - 145 mmol/L   Potassium 3.5 3.5 - 5.1 mmol/L   Chloride 99 96 - 112 mmol/L   CO2 25 19 - 32 mmol/L   Glucose, Bld 202 (H) 70 - 99 mg/dL   BUN 38 (H) 6 - 23 mg/dL   Creatinine, Ser 2.88 (H) 0.50 - 1.35 mg/dL   Calcium 8.2 (L) 8.4 - 10.5 mg/dL   GFR calc non Af Amer 22 (L) >90 mL/min   GFR calc Af Amer 26 (L) >90 mL/min    Comment: (NOTE) The eGFR has been calculated using the CKD EPI equation. This calculation has not been validated in all clinical situations. eGFR's persistently <90 mL/min signify possible Chronic Kidney Disease.    Anion gap 6 5 - 15  Carboxyhemoglobin     Status: Abnormal   Collection Time: 02/26/15  4:04 AM  Result  Value Ref Range   Total hemoglobin 13.2 (L) 13.5 - 18.0 g/dL   O2 Saturation 61.6 %   Carboxyhemoglobin 1.1 0.5 - 1.5 %   Methemoglobin 0.9 0.0 - 1.5 %  Glucose, capillary  Status: Abnormal   Collection Time: 02/26/15  8:11 AM  Result Value Ref Range   Glucose-Capillary 192 (H) 70 - 99 mg/dL   Comment 1 Notify RN   Glucose, capillary     Status: Abnormal   Collection Time: 02/26/15 12:34 PM  Result Value Ref Range   Glucose-Capillary 231 (H) 70 - 99 mg/dL  Glucose, capillary     Status: Abnormal   Collection Time: 02/26/15  4:29 PM  Result Value Ref Range   Glucose-Capillary 267 (H) 70 - 99 mg/dL   Comment 1 Capillary Specimen   Magnesium     Status: None   Collection Time: 02/26/15  5:43 PM  Result Value Ref Range   Magnesium 2.1 1.5 - 2.5 mg/dL  Glucose, capillary     Status: Abnormal   Collection Time: 02/26/15  9:52 PM  Result Value Ref Range   Glucose-Capillary 170 (H) 70 - 99 mg/dL  Glucose, capillary     Status: Abnormal   Collection Time: 02/27/15  7:03 AM  Result Value Ref Range   Glucose-Capillary 180 (H) 70 - 99 mg/dL    Imaging: No results found.  Assessment: 1. Acute on chronic systolic HF with biventricular ICD 2. Cardiogenic shock - resolved 3. Ventricular fibrillation 4. Acute on chronic CKD Stage 4 5. Ischemic cardiomyopathy 6. CAD with stents to LAD 7. Paroxysmal AFib 8. Type 2 DM 9. Hypokalemia/hyponatremia  Plan:   No further VT/VF. Continue amio 200 bid. Will let him go home today. BMET and magnesium pending.   CLEGG,AMY,NP-C  8:21 AM  Patient seen and examined with Darrick Grinder, NP. We discussed all aspects of the encounter. I agree with the assessment and plan as stated above.   Looks good. Eager to go home. Brief episode of pleuritic CP today after swallowing cold water. Now resolved. Renal function stable. Can go home today. Will see back in HF Clinic next week.    ,MD 11:14 AM

## 2015-02-27 NOTE — Progress Notes (Signed)
Pt c/o of chest pain about 5min ago that was relieved with taking 2 deep breaths.  MD notified and ordered pt's home dose of ranexa. [name]

## 2015-02-28 DIAGNOSIS — I2510 Atherosclerotic heart disease of native coronary artery without angina pectoris: Secondary | ICD-10-CM | POA: Diagnosis not present

## 2015-02-28 DIAGNOSIS — S066X0D Traumatic subarachnoid hemorrhage without loss of consciousness, subsequent encounter: Secondary | ICD-10-CM | POA: Diagnosis not present

## 2015-02-28 DIAGNOSIS — I5022 Chronic systolic (congestive) heart failure: Secondary | ICD-10-CM | POA: Diagnosis not present

## 2015-02-28 DIAGNOSIS — I480 Paroxysmal atrial fibrillation: Secondary | ICD-10-CM | POA: Diagnosis not present

## 2015-02-28 DIAGNOSIS — W19XXXD Unspecified fall, subsequent encounter: Secondary | ICD-10-CM | POA: Diagnosis not present

## 2015-02-28 DIAGNOSIS — N183 Chronic kidney disease, stage 3 (moderate): Secondary | ICD-10-CM | POA: Diagnosis not present

## 2015-03-02 DIAGNOSIS — W19XXXD Unspecified fall, subsequent encounter: Secondary | ICD-10-CM | POA: Diagnosis not present

## 2015-03-02 DIAGNOSIS — I480 Paroxysmal atrial fibrillation: Secondary | ICD-10-CM | POA: Diagnosis not present

## 2015-03-02 DIAGNOSIS — N183 Chronic kidney disease, stage 3 (moderate): Secondary | ICD-10-CM | POA: Diagnosis not present

## 2015-03-02 DIAGNOSIS — I5022 Chronic systolic (congestive) heart failure: Secondary | ICD-10-CM | POA: Diagnosis not present

## 2015-03-02 DIAGNOSIS — I2510 Atherosclerotic heart disease of native coronary artery without angina pectoris: Secondary | ICD-10-CM | POA: Diagnosis not present

## 2015-03-02 DIAGNOSIS — S066X0D Traumatic subarachnoid hemorrhage without loss of consciousness, subsequent encounter: Secondary | ICD-10-CM | POA: Diagnosis not present

## 2015-03-04 IMAGING — CR DG CHEST 1V PORT
1 series · 1 of 1 positions shown · non-contrast
Comparison: 07/30/2013.

CLINICAL DATA: Chest pain.

EXAM:
PORTABLE CHEST - 1 VIEW

[portable]
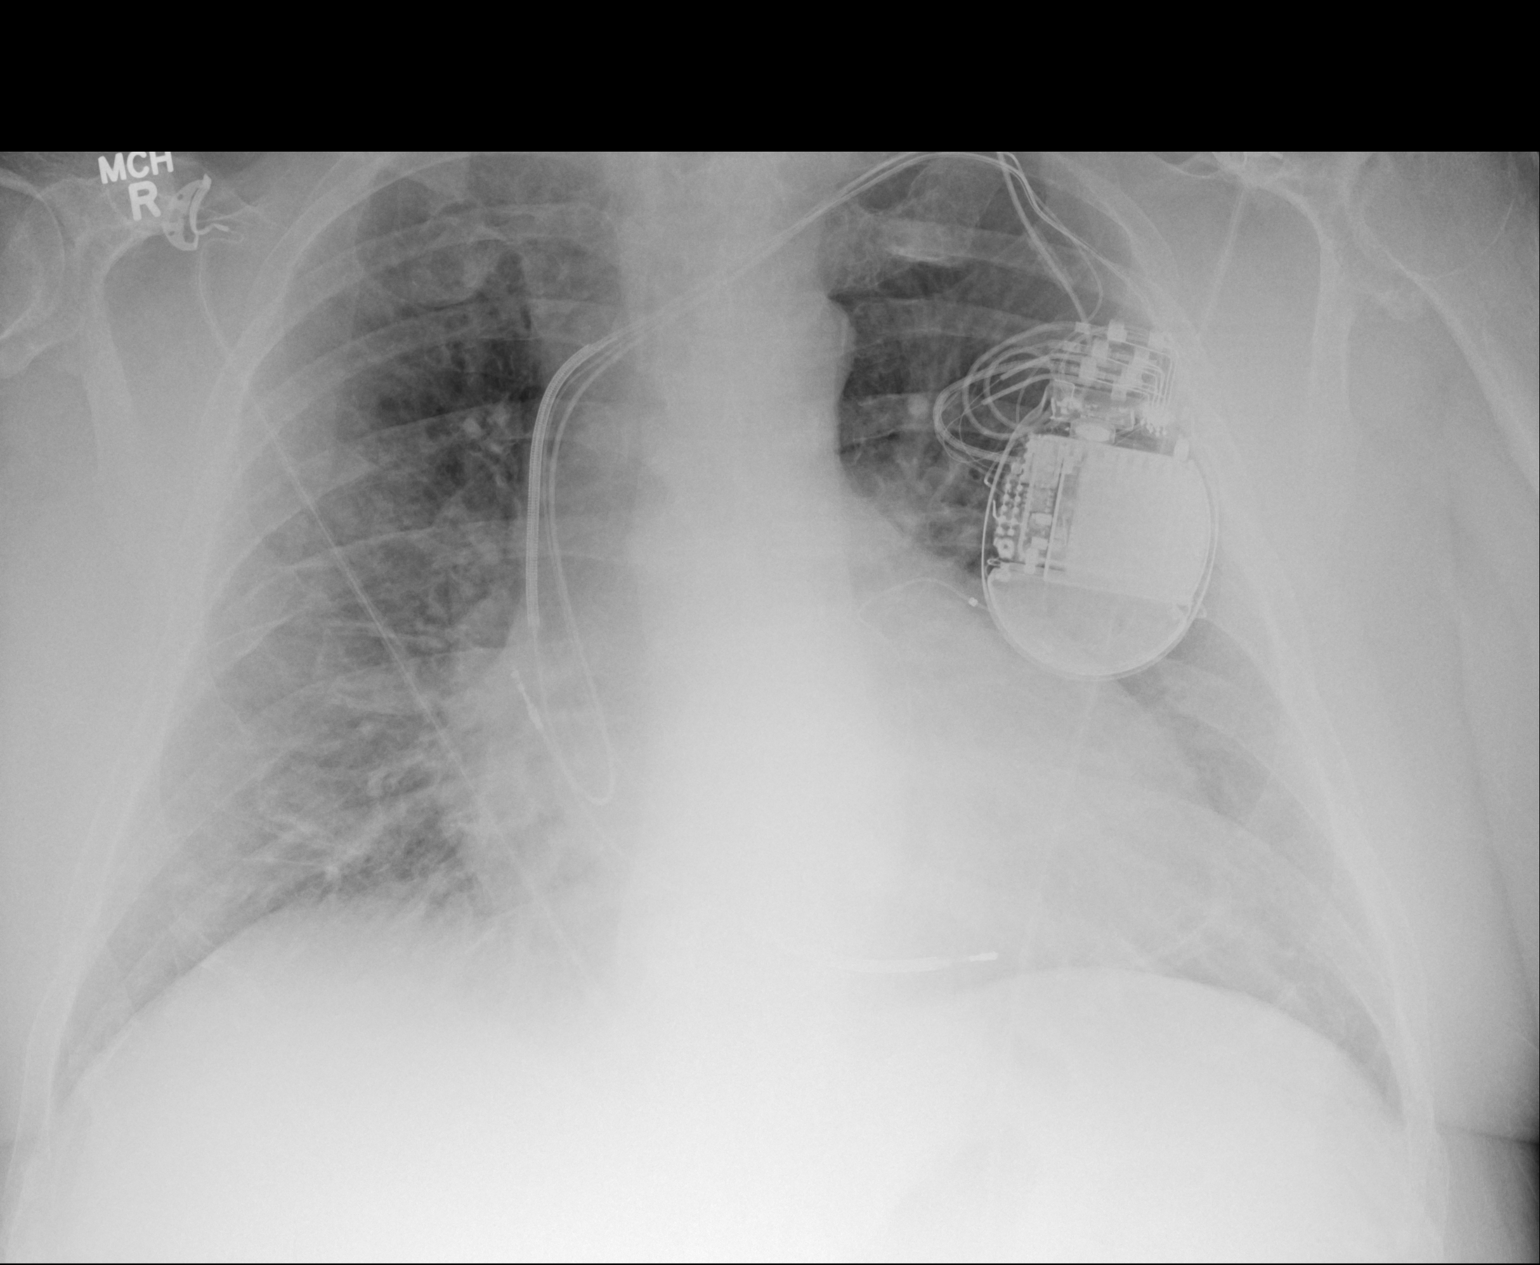

[1 of 1 positions shown; findings below may reference images not displayed]

FINDINGS: There is stable cardiomegaly. There is a 3 lead cardiac pacer. There
is no pleural effusion or pneumothorax. There is no focal
consolidation. There is bilateral interstitial thickening with
cephalization and prominence of the central pulmonary vasculature
most concerning for congestive failure. The osseous structures are
unremarkable.
IMPRESSION: Findings most concerning for mild congestive failure.

## 2015-03-06 DIAGNOSIS — W19XXXD Unspecified fall, subsequent encounter: Secondary | ICD-10-CM | POA: Diagnosis not present

## 2015-03-06 DIAGNOSIS — I5022 Chronic systolic (congestive) heart failure: Secondary | ICD-10-CM | POA: Diagnosis not present

## 2015-03-06 DIAGNOSIS — S066X0D Traumatic subarachnoid hemorrhage without loss of consciousness, subsequent encounter: Secondary | ICD-10-CM | POA: Diagnosis not present

## 2015-03-06 DIAGNOSIS — I2510 Atherosclerotic heart disease of native coronary artery without angina pectoris: Secondary | ICD-10-CM | POA: Diagnosis not present

## 2015-03-06 DIAGNOSIS — N183 Chronic kidney disease, stage 3 (moderate): Secondary | ICD-10-CM | POA: Diagnosis not present

## 2015-03-06 DIAGNOSIS — I480 Paroxysmal atrial fibrillation: Secondary | ICD-10-CM | POA: Diagnosis not present

## 2015-03-09 DIAGNOSIS — N184 Chronic kidney disease, stage 4 (severe): Secondary | ICD-10-CM | POA: Diagnosis not present

## 2015-03-09 DIAGNOSIS — W19XXXD Unspecified fall, subsequent encounter: Secondary | ICD-10-CM | POA: Diagnosis not present

## 2015-03-09 DIAGNOSIS — S066X0D Traumatic subarachnoid hemorrhage without loss of consciousness, subsequent encounter: Secondary | ICD-10-CM | POA: Diagnosis not present

## 2015-03-09 DIAGNOSIS — I5023 Acute on chronic systolic (congestive) heart failure: Secondary | ICD-10-CM | POA: Diagnosis not present

## 2015-03-09 DIAGNOSIS — I255 Ischemic cardiomyopathy: Secondary | ICD-10-CM | POA: Diagnosis not present

## 2015-03-09 DIAGNOSIS — I2510 Atherosclerotic heart disease of native coronary artery without angina pectoris: Secondary | ICD-10-CM | POA: Diagnosis not present

## 2015-03-09 DIAGNOSIS — I480 Paroxysmal atrial fibrillation: Secondary | ICD-10-CM | POA: Diagnosis not present

## 2015-03-09 DIAGNOSIS — E1165 Type 2 diabetes mellitus with hyperglycemia: Secondary | ICD-10-CM | POA: Diagnosis not present

## 2015-03-09 DIAGNOSIS — Z933 Colostomy status: Secondary | ICD-10-CM | POA: Diagnosis not present

## 2015-03-09 DIAGNOSIS — E1122 Type 2 diabetes mellitus with diabetic chronic kidney disease: Secondary | ICD-10-CM | POA: Diagnosis not present

## 2015-03-10 ENCOUNTER — Ambulatory Visit (HOSPITAL_COMMUNITY)
Admission: RE | Admit: 2015-03-10 | Discharge: 2015-03-10 | Disposition: A | Discharge status: Home / Self Care | Payer: Medicare Other | Source: Ambulatory Visit | Attending: Cardiology | Admitting: Cardiology

## 2015-03-10 ENCOUNTER — Encounter (HOSPITAL_COMMUNITY): Payer: Self-pay

## 2015-03-10 VITALS — BP 114/62 | HR 84

## 2015-03-10 DIAGNOSIS — I480 Paroxysmal atrial fibrillation: Secondary | ICD-10-CM | POA: Diagnosis not present

## 2015-03-10 DIAGNOSIS — I5023 Acute on chronic systolic (congestive) heart failure: Secondary | ICD-10-CM | POA: Diagnosis not present

## 2015-03-10 DIAGNOSIS — I5022 Chronic systolic (congestive) heart failure: Secondary | ICD-10-CM | POA: Diagnosis not present

## 2015-03-10 DIAGNOSIS — I4901 Ventricular fibrillation: Secondary | ICD-10-CM

## 2015-03-10 DIAGNOSIS — N184 Chronic kidney disease, stage 4 (severe): Secondary | ICD-10-CM

## 2015-03-10 LAB — TSH: TSH: 4.568 u[IU]/mL — ABNORMAL HIGH (ref 0.350–4.500)

## 2015-03-10 LAB — HEPATIC FUNCTION PANEL
ALT: 14 U/L (ref 0–53)
AST: 34 U/L (ref 0–37)
Albumin: 2.6 g/dL — ABNORMAL LOW (ref 3.5–5.2)
Alkaline Phosphatase: 80 U/L (ref 39–117)
BILIRUBIN INDIRECT: 0.7 mg/dL (ref 0.3–0.9)
BILIRUBIN TOTAL: 1.1 mg/dL (ref 0.3–1.2)
Bilirubin, Direct: 0.4 mg/dL (ref 0.0–0.5)
Total Protein: 7 g/dL (ref 6.0–8.3)

## 2015-03-10 LAB — BASIC METABOLIC PANEL
ANION GAP: 13 (ref 5–15)
BUN: 22 mg/dL (ref 6–23)
CHLORIDE: 100 mmol/L (ref 96–112)
CO2: 22 mmol/L (ref 19–32)
Calcium: 9.2 mg/dL (ref 8.4–10.5)
Creatinine, Ser: 2.49 mg/dL — ABNORMAL HIGH (ref 0.50–1.35)
GFR calc non Af Amer: 27 mL/min — ABNORMAL LOW (ref >90)
GFR, EST AFRICAN AMERICAN: 31 mL/min — AB (ref >90)
Glucose, Bld: 353 mg/dL — ABNORMAL HIGH (ref 70–99)
POTASSIUM: 4.5 mmol/L (ref 3.5–5.1)
Sodium: 135 mmol/L (ref 135–145)

## 2015-03-10 LAB — DIGOXIN LEVEL: Digoxin Level: 0.8 ng/mL (ref 0.8–2.0)

## 2015-03-10 IMAGING — CR DG CHEST 1V PORT
1 series · 1 of 1 positions shown · non-contrast
Comparison: October 23, 2013.

CLINICAL DATA: Weakness and hypotension.

EXAM:
PORTABLE CHEST - 1 VIEW

[portable]
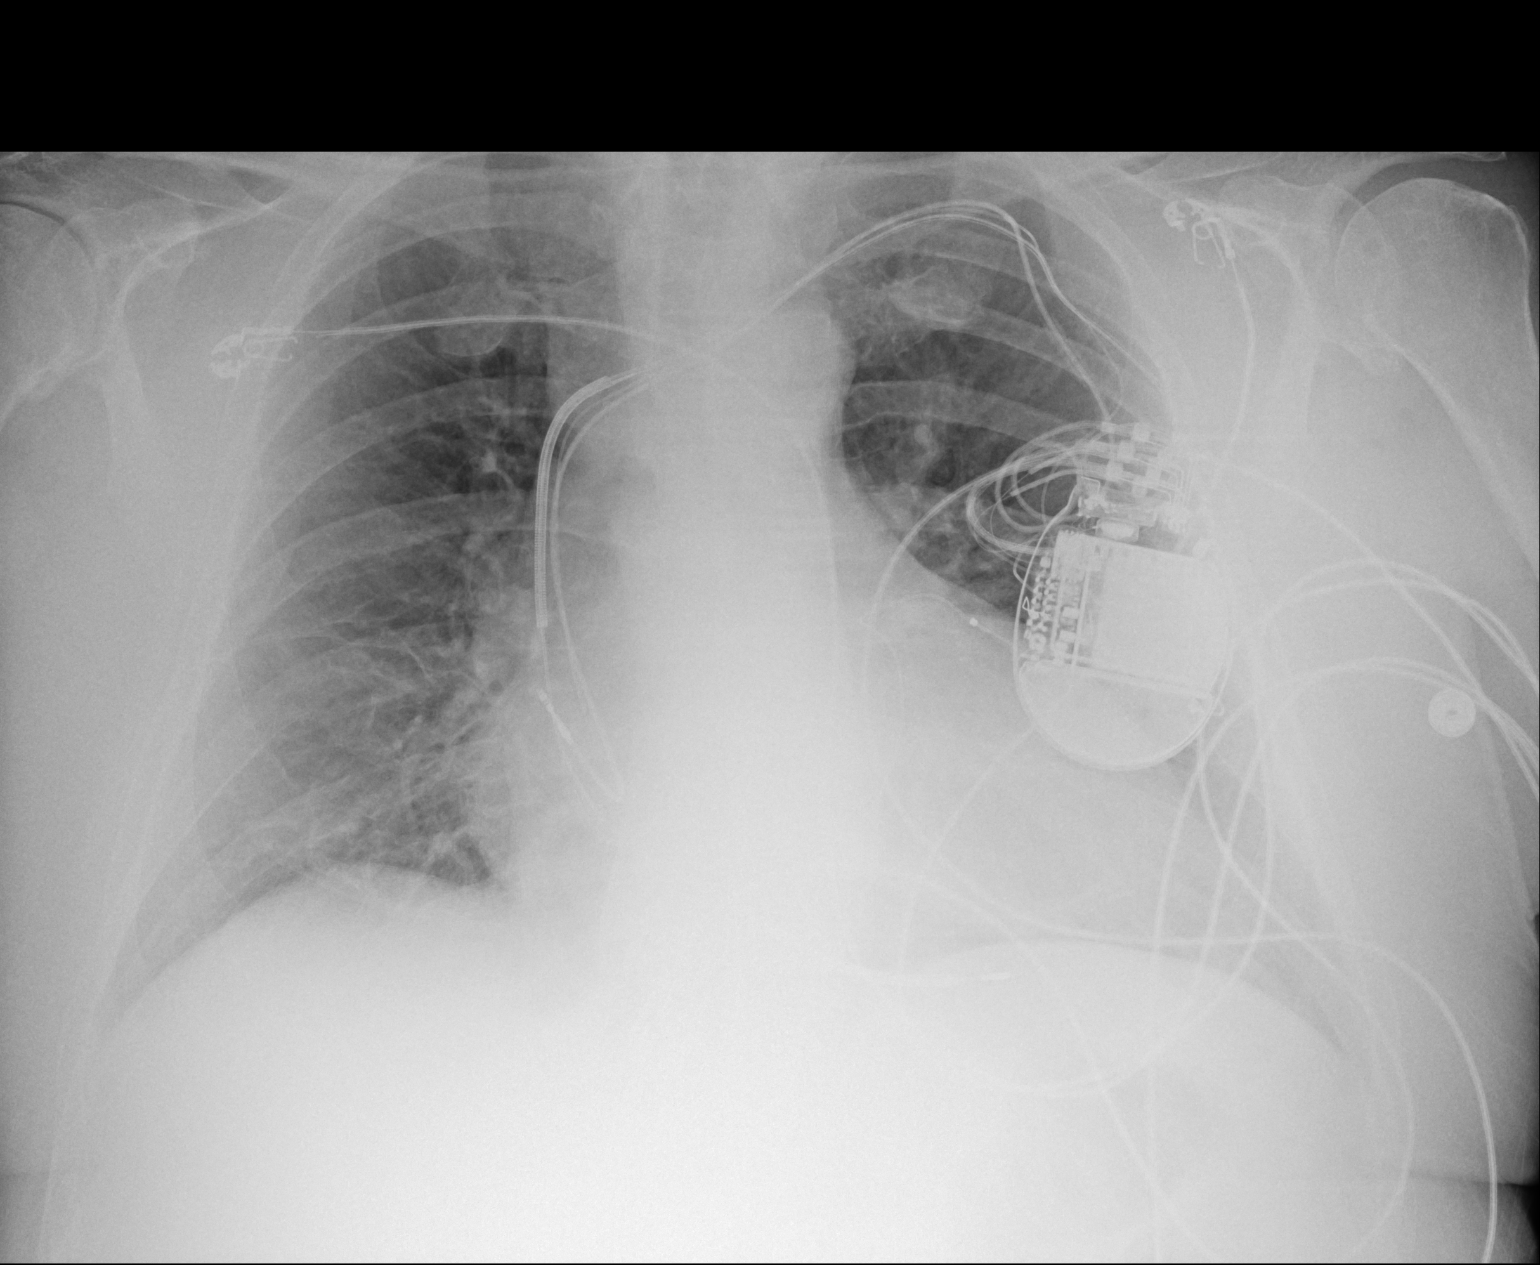

[1 of 1 positions shown; findings below may reference images not displayed]

FINDINGS: Since the previous study the pulmonary interstitium has become less
congested appearing. The cardiac silhouette remains enlarged. The
pulmonary vascularity is less engorged. The permanent pacemaker
defibrillator appears unchanged. There is no pleural effusion.
IMPRESSION: There has been improvement in the appearance of the pulmonary
vascularity and pulmonary interstitium since the earlier study.
Low-grade CHF likely persists. There is no evidence of pneumonia.

## 2015-03-10 MED ORDER — AMIODARONE HCL 200 MG PO TABS: 200.0000 mg | ORAL_TABLET | Freq: Every day | ORAL | Status: DC

## 2015-03-10 MED ORDER — METOPROLOL SUCCINATE ER 25 MG PO TB24: ORAL_TABLET | ORAL | Status: DC

## 2015-03-10 NOTE — Patient Instructions (Signed)
INCREASE Metoprolol XL to 50mg  (2 tablets) in am, and 25mg  (1 tablet) in pm.  DECREASE Amiodarone to 200mg  (1 tablet) once daily.  Follow up 2 weeks.  Do the following things EVERYDAY: 1) Weigh yourself in the morning before breakfast. Write it down and keep it in a log. 2) Take your medicines as prescribed 3) Eat low salt foods-Limit salt (sodium) to 2000 mg per day.  4) Stay as active as you can everyday 5) Limit all fluids for the day to less than 2 liters

## 2015-03-11 NOTE — Progress Notes (Signed)
Patient ID: [name], male   DOB: 15-Oct-1956, 59 y.o.   MRN: [id number] PCP: Dr. Gerarda Fraction  59 yo with history of CAD and ischemic cardiomyopathy, paroxysmal atrial fibrillation, CKD, and recent traumatic SAH presents for evaluation in CHF clinic.  He has been followed at the heart failure clinic at [facility] by Dr Carolynn Serve in the past.  He was recently (61/16) admitted from the [place] with exertional dyspnea/volume overload and had a cardiac arrest/ventricular fibrillation while in the hospital terminated by his ICD.  After diuresis, RHC showed relatively normal filling pressures and preserved cardiac index.  Echo in 3/16 showed EF 25-30% with restrictive diastolic function.  Cardiolite was done, showing areas of scar but no ischemia.  EF 18%.    He has done relatively well since discharge.  He can walk > 1 block without dyspnea.  No orthopnea or PND.  When it is cold, he sometimes gets chest pain with exertion.  However, now that it is warmer, he is having no chest pain.  He is using Lasix only prn and is not using it often.  Weight is stable.    ECG: NSR, BiV-paced  Labs (3/16): K 3.8, creatinine 2.78  PMH: 1. HTN 2. Type II diabetes 3. CAD: s/p BMS LAD in 2001, PTCA ramus and BMS LAD in 2009.  Cardiolite (3/16) with EF 18%, no ischemia, prior anterior, apical and inferior infarction.  4. Chronic systolic CHF: Ischemic CMP.  St Jude CRT-D.  Echo (3/16) with EF 25-30%, restrictive diastolic function, mild LVH, mild MR.  RHC (3/16) with mean RA 9, PA 47/29, mean PCWP 16, CI 2.5 (Fick).  5. SAH: 1/16 after fall (traumatic).   6. GERD 7. Atrial fibrillation: Paroxysmal, on amiodarone. 8. Rectal cancer: s/p surgery. Has colostomy.  9. Prostate cancer: s/p chemo/radiation.  10. H/o TIA 11. OSA: On CPAP.  12. Hyperlipidemia 13. CKD  SH: Married, lives in [place], nonsmoker.   FH: CAD  ROS: All systems reviewed and negative except as per HPI.   Current Outpatient Prescriptions   Medication Sig Dispense Refill  . acetaminophen (TYLENOL) 500 MG tablet Take 1,000 mg by mouth every 6 (six) hours as needed for moderate pain.    Marland Kitchen amiodarone (PACERONE) 200 MG tablet Take 1 tablet (200 mg total) by mouth daily. 30 tablet 6  . apixaban (ELIQUIS) 5 MG TABS tablet Take 5 mg by mouth 2 (two) times daily.    Marland Kitchen aspirin EC 81 MG tablet Take 81 mg by mouth daily.    Marland Kitchen co-enzyme Q-10 50 MG capsule Take 50 mg by mouth every morning.     . digoxin (LANOXIN) 0.125 MG tablet Take 1 tablet by mouth every other day.  0  . enalapril (VASOTEC) 10 MG tablet Take 10 mg by mouth 2 (two) times daily.    . fluticasone (FLONASE) 50 MCG/ACT nasal spray Place 1 spray into both nostrils daily as needed for allergies.     . furosemide (LASIX) 40 MG tablet Take 1 tablet (40 mg total) by mouth daily as needed. 30 tablet 0  . HYDROcodone-acetaminophen (NORCO/VICODIN) 5-325 MG per tablet Take 1-2 tablets by mouth every 6 (six) hours as needed. 30 tablet 0  . isosorbide dinitrate (ISORDIL) 30 MG tablet Take 15 mg by mouth 2 (two) times daily.     . metoprolol succinate (TOPROL-XL) 25 MG 24 hr tablet Take 50mg  (2 tablets) in am and 25mg  (1 tablet) in pm. 90 tablet 3  . nitroGLYCERIN (  NITROLINGUAL) 0.4 MG/SPRAY spray Place 1 spray under the tongue every 5 (five) minutes as needed. angina 12 g 3  . Omega-3 Fatty Acids (FISH OIL) 1200 MG CAPS Take 1,200 mg by mouth 2 (two) times daily.      . pantoprazole (PROTONIX) 40 MG tablet Take 1 tablet (40 mg total) by mouth daily. 30 tablet 6  . ranolazine (RANEXA) 500 MG 12 hr tablet Take 1 tablet (500 mg total) by mouth 2 (two) times daily. 60 tablet 6  . rosuvastatin (CRESTOR) 40 MG tablet Take 1 tablet (40 mg total) by mouth at bedtime. 90 tablet 3  . meclizine (ANTIVERT) 25 MG tablet Take 25 mg by mouth as needed for dizziness.     No current facility-administered medications for this encounter.   BP 114/62 mmHg  Pulse 84  SpO2 98% General: NAD Neck: No JVD,  no thyromegaly or thyroid nodule.  Lungs: Clear to auscultation bilaterally with normal respiratory effort. CV: Nondisplaced PMI.  Heart regular S1/S2, no S3/S4, 2/6 SEM RUSB.  1+ ankle edema.  No carotid bruit.  Normal pedal pulses.  Abdomen: Soft, nontender, no hepatosplenomegaly, no distention. S/p colostomy.  Skin: Intact without lesions or rashes.  Neurologic: Alert and oriented x 3.  Psych: Normal affect. Extremities: No clubbing or cyanosis.  HEENT: Normal.   Assessment/Plan: 1. Chronic systolic CHF: Ischemic cardiomyopathy.  Has been followed for a long time at [facility].  Deemed not transplant or VAD candidate with comorbities and advanced CKD.  He seems to be doing fairly well since last hospitalization.  On exam, he is not volume overloaded.  NYHA class II symptoms.   - Continue current digoxin, check level today.  - He will continue to use Lasix as needed for weight gain.  I do not think he has to take daily Lasix at this point.  - Continue enalapril 10 mg bid as long as creatinine remains stable.  - Increase Toprol XL to 50 qam, 25 qpm.  2. Atrial fibrillation: Paroxysmal.  In NSR today.  He is on amiodarone and Eliquis.  Had traumatic SAH in 1/16 but stable back on Eliquis.  - Amiodarone decrease to 200 mg daily.  - CBC today.  - Check LFTs/TSH given amiodarone use.  Will need at least yearly eye exam.  3. CAD: No recent angina.  He is on ASA 81 daily and statin. Angina controlled with Ranexa and isordil.  4. CKD: Stable creatinine most recently.  To get BMET today.  Sees Dr Lowanda Foster in [place].  5. Ventricular fibrillation: On amiodarone, decrease to 200 mg daily (and increasing Toprol XL).   Loralie Champagne 03/11/2015

## 2015-03-14 DIAGNOSIS — I2510 Atherosclerotic heart disease of native coronary artery without angina pectoris: Secondary | ICD-10-CM | POA: Diagnosis not present

## 2015-03-14 DIAGNOSIS — E1122 Type 2 diabetes mellitus with diabetic chronic kidney disease: Secondary | ICD-10-CM | POA: Diagnosis not present

## 2015-03-14 DIAGNOSIS — I5023 Acute on chronic systolic (congestive) heart failure: Secondary | ICD-10-CM | POA: Diagnosis not present

## 2015-03-14 DIAGNOSIS — I480 Paroxysmal atrial fibrillation: Secondary | ICD-10-CM | POA: Diagnosis not present

## 2015-03-14 DIAGNOSIS — E1165 Type 2 diabetes mellitus with hyperglycemia: Secondary | ICD-10-CM | POA: Diagnosis not present

## 2015-03-14 DIAGNOSIS — N184 Chronic kidney disease, stage 4 (severe): Secondary | ICD-10-CM | POA: Diagnosis not present

## 2015-03-19 ENCOUNTER — Encounter: Payer: Self-pay | Admitting: Internal Medicine

## 2015-03-19 ENCOUNTER — Ambulatory Visit (INDEPENDENT_AMBULATORY_CARE_PROVIDER_SITE_OTHER): Payer: Medicare Other | Admitting: Internal Medicine

## 2015-03-19 VITALS — BP 92/62 | HR 64 | Ht 70.0 in | Wt 209.1 lb

## 2015-03-19 DIAGNOSIS — I255 Ischemic cardiomyopathy: Secondary | ICD-10-CM

## 2015-03-19 DIAGNOSIS — I5022 Chronic systolic (congestive) heart failure: Secondary | ICD-10-CM

## 2015-03-19 DIAGNOSIS — I480 Paroxysmal atrial fibrillation: Secondary | ICD-10-CM | POA: Diagnosis not present

## 2015-03-19 DIAGNOSIS — I4901 Ventricular fibrillation: Secondary | ICD-10-CM | POA: Diagnosis not present

## 2015-03-19 LAB — MDC_IDC_ENUM_SESS_TYPE_INCLINIC
Battery Remaining Longevity: 0.1 mo
Brady Statistic RA Percent Paced: 23 %
Brady Statistic RV Percent Paced: 58 %
Date Time Interrogation Session: 20160413102240
HIGH POWER IMPEDANCE MEASURED VALUE: 37.2829
Implantable Pulse Generator Serial Number: 668098
Lead Channel Impedance Value: 337.5 Ohm
Lead Channel Impedance Value: 600 Ohm
Lead Channel Pacing Threshold Amplitude: 0.75 V
Lead Channel Pacing Threshold Pulse Width: 0.5 ms
Lead Channel Pacing Threshold Pulse Width: 0.5 ms
Lead Channel Sensing Intrinsic Amplitude: 2 mV
Lead Channel Sensing Intrinsic Amplitude: 6.4 mV
Lead Channel Setting Pacing Amplitude: 2 V
Lead Channel Setting Pacing Amplitude: 2.5 V
Lead Channel Setting Pacing Pulse Width: 0.5 ms
Lead Channel Setting Pacing Pulse Width: 0.8 ms
Lead Channel Setting Sensing Sensitivity: 0.3 mV
MDC IDC MSMT BATTERY VOLTAGE: 2.45 V
MDC IDC MSMT LEADCHNL LV PACING THRESHOLD AMPLITUDE: 0.75 V
MDC IDC MSMT LEADCHNL RA IMPEDANCE VALUE: 350 Ohm
MDC IDC MSMT LEADCHNL RV PACING THRESHOLD AMPLITUDE: 1.25 V
MDC IDC MSMT LEADCHNL RV PACING THRESHOLD PULSEWIDTH: 0.8 ms
MDC IDC SET LEADCHNL RA PACING AMPLITUDE: 2 V
MDC IDC SET ZONE DETECTION INTERVAL: 320 ms
Zone Setting Detection Interval: 270 ms
Zone Setting Detection Interval: 400 ms

## 2015-03-19 NOTE — Assessment & Plan Note (Signed)
He is maintaining NSR. He will continue his current meds including amio and Eliquis.

## 2015-03-19 NOTE — Patient Instructions (Addendum)
Your physician wants you to follow-up in: 1 year with Dr. Lovena Le. You will receive a reminder letter in the mail two months in advance. If you don't receive a letter, please call our office to schedule the follow-up appointment.  Your physician recommends that you schedule a follow-up appointment in: [facility] in 1 month   Your physician recommends that you continue on your current medications as directed. Please refer to the Current Medication list given to you today.  Thank you for choosing [facility]!

## 2015-03-19 NOTE — Assessment & Plan Note (Signed)
His symptoms are currently class 2B-3A. He did note a 4 lb weight gain over 24 hours. He will increase his lasix for 2 days. He is encouraged to maintain a low sodium diet.

## 2015-03-19 NOTE — Progress Notes (Signed)
HPI [name] returns today for ongoing ICD evaluation and management. He is a pleasant 59 yo man, s/p MI, s/p ICD implant in 2002 and a BiV ICD upgrade in 2009. In the interim, he has  He has been anti-coagulated with coumadin but has had some non-compliance. He has been on fairly high dose amiodarone. He does not feel palpitations and his CHF is class 2B-3A. He was hospitalized approx. 4 weeks ago with acute on chronic systolic heart failure and sustained an appropriate ICD shock for VT. Since then he has improved.   No Known Allergies   Current Outpatient Prescriptions  Medication Sig Dispense Refill  . acetaminophen (TYLENOL) 500 MG tablet Take 1,000 mg by mouth every 6 (six) hours as needed for moderate pain.    Marland Kitchen amiodarone (PACERONE) 200 MG tablet Take 1 tablet (200 mg total) by mouth daily. 30 tablet 6  . apixaban (ELIQUIS) 5 MG TABS tablet Take 5 mg by mouth 2 (two) times daily.    Marland Kitchen aspirin EC 81 MG tablet Take 81 mg by mouth daily.    Marland Kitchen co-enzyme Q-10 50 MG capsule Take 50 mg by mouth every morning.     . digoxin (LANOXIN) 0.125 MG tablet Take 1 tablet by mouth every other day.  0  . enalapril (VASOTEC) 10 MG tablet Take 10 mg by mouth daily.     . fluticasone (FLONASE) 50 MCG/ACT nasal spray Place 1 spray into both nostrils daily as needed for allergies.     . furosemide (LASIX) 40 MG tablet Take 1 tablet (40 mg total) by mouth daily as needed. 30 tablet 0  . HYDROcodone-acetaminophen (NORCO/VICODIN) 5-325 MG per tablet Take 1-2 tablets by mouth every 6 (six) hours as needed. 30 tablet 0  . isosorbide dinitrate (ISORDIL) 30 MG tablet Take 15 mg by mouth 2 (two) times daily.     . metoprolol succinate (TOPROL-XL) 25 MG 24 hr tablet Take 50mg  (2 tablets) in am and 25mg  (1 tablet) in pm. 90 tablet 3  . nitroGLYCERIN (NITROLINGUAL) 0.4 MG/SPRAY spray Place 1 spray under the tongue every 5 (five) minutes as needed. angina 12 g 3  . Omega-3 Fatty Acids (FISH OIL) 1200 MG CAPS  Take 1,200 mg by mouth 2 (two) times daily.      . pantoprazole (PROTONIX) 40 MG tablet Take 1 tablet (40 mg total) by mouth daily. 30 tablet 6  . ranolazine (RANEXA) 500 MG 12 hr tablet Take 1 tablet (500 mg total) by mouth 2 (two) times daily. 60 tablet 6  . rosuvastatin (CRESTOR) 40 MG tablet Take 1 tablet (40 mg total) by mouth at bedtime. 90 tablet 3   No current facility-administered medications for this visit.     Past Medical History  Diagnosis Date  . Essential hypertension, benign   . CAD (coronary artery disease)     a. BMS to LAD 2001 at [facility] b. PTCA/atherectomy ramus and BMS to LAD 2009  . Chronic systolic heart failure   . GERD (gastroesophageal reflux disease)   . Paroxysmal atrial fibrillation     a. on amiodarone, digoxin and Eliquis  . Adenocarcinoma of rectum     a. 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 Prostate cancer     a. s/p seed implants with chemo and radiation  . Biventricular ICD (implantable cardioverter-defibrillator) in place 2009    upgrade  . TIA (transient ischemic attack)   . OSA on CPAP   . HLD (hyperlipidemia)   . Orthostatic  hypotension   . Dizziness     a. chronic. Admission for this 07/18/2014  . History of falling July 2015    due to dizziness from medications  . Ischemic cardiomyopathy     EF 18% by nuclear study 2016, multiple myocardial infarctions in past    . Chronic kidney disease, stage IV (severe) 12/11/2013  . DM type 2, uncontrolled, with renal complications 2/94/7654  . Obesity 12/12/2013    ROS:   All systems reviewed and negative except as noted in the HPI.   Past Surgical History  Procedure Laterality Date  . Internal defibrillator and pacemaker  2002  . Abdominal and perineal resection of rectum with total mesorectal excision  10/04/2007  . Colonoscopy  09/14/2011    Dr. Gala Romney: via colostomy, Single pedunculated benign inflammatory polyp. Due for surveillance Oct 2015  . Colostomy  2008  . Colonoscopy N/A 07/02/2014    Procedure:  COLONOSCOPY;  Surgeon: Daneil Dolin, MD;  [facility];  Service: Endoscopy;  Laterality: N/A;  7:30 / COLONOSCOPY THRU COLOSTOMY  . Esophagogastroduodenoscopy N/A 07/02/2014    Procedure: ESOPHAGOGASTRODUODENOSCOPY (EGD);  Surgeon: Daneil Dolin, MD;  Location: [facility];  Service: Endoscopy;  Laterality: N/A;  7:30  . Savory dilation N/A 07/02/2014    Procedure: SAVORY DILATION;  Surgeon: Daneil Dolin, MD;  Location: [facility];  Service: Endoscopy;  Laterality: N/A;  7:30  . Maloney dilation N/A 07/02/2014    Procedure: Venia Minks DILATION;  Surgeon: Daneil Dolin, MD;  Location: [facility];  Service: Endoscopy;  Laterality: N/A;  7:30  . Portacath placement  06/2007    "removed ~ 1 yr later"  . Cardiac catheterization  08/2001  . Coronary angioplasty with stent placement  2001; ~ 2006    "1 + 1"   . Left heart catheterization with coronary angiogram N/A 07/13/2013    Procedure: LEFT HEART CATHETERIZATION WITH CORONARY ANGIOGRAM;  Surgeon: Lorretta Harp, MD;  Location: [facility] CATH LAB;  Service: Cardiovascular;  Laterality: N/A;  . Colonoscopy N/A 12/11/2014    Dr. Gala Romney via colostomy. Normal. Repeat in 2021.   Marland Kitchen Esophagogastroduodenoscopy N/A 12/11/2014    [id number] EGD  . Cardiac defibrillator placement  2009    Upgraded to a BiV ICD  . Right heart catheterization N/A 02/24/2015    Procedure: RIGHT HEART CATH;  Surgeon: Jolaine Artist, MD;  Location: [facility] CATH LAB;  Service: Cardiovascular;  Laterality: N/A;     Family History  Problem Relation Age of Onset  . Colon cancer Mother 53  . Colon cancer Sister 90  . Coronary artery disease Father   . Colon cancer Other     2 cousins, succumbed to illness     History   Social History  . Marital Status: Married    Spouse Name: N/A  . Number of Children: 1  . Years of Education: N/A   Occupational History  .     Social History Main Topics  . Smoking status: Never Smoker   . Smokeless tobacco: Never Used  .  Alcohol Use: No     Comment: Former user 45 years ago  . Drug Use: No  . Sexual Activity: No   Other Topics Concern  . Not on file   Social History Narrative     BP 92/62 mmHg  Pulse 64  Ht 5\' 10"  (1.778 m)  Wt 209 lb 2 oz (94.858 kg)  BMI 30.01 kg/m2  Physical Exam:  Stable appearing middle aged man, NAD HEENT: Unremarkable Neck:  7 cm JVD, no thyromegally Back:  No CVA tenderness Lungs:  Clear with no wheezes, rales, or rhonchi HEART:  Regular rate rhythm, no murmurs, no rubs, no clicks, soft S4 gallop Abd:  soft, positive bowel sounds, no organomegally, no rebound, no guarding Ext:  2 plus pulses, no edema, no cyanosis, no clubbing Skin:  No rashes no nodules Neuro:  CN II through XII intact, motor grossly intact   DEVICE  Normal device function.  See PaceArt for details. Underlying rhythm is NSR  Assess/Plan:

## 2015-03-19 NOTE — Assessment & Plan Note (Signed)
He denies anginal symptoms. No change in meds. I encouraged the patient to increase his physical activity.

## 2015-03-19 NOTE — Assessment & Plan Note (Signed)
He has had no recurrent ventricular arrhythmias since undergoing ICD shock for VT. He will continue his amio.

## 2015-03-20 DIAGNOSIS — N184 Chronic kidney disease, stage 4 (severe): Secondary | ICD-10-CM | POA: Diagnosis not present

## 2015-03-20 DIAGNOSIS — E1122 Type 2 diabetes mellitus with diabetic chronic kidney disease: Secondary | ICD-10-CM | POA: Diagnosis not present

## 2015-03-20 DIAGNOSIS — E1165 Type 2 diabetes mellitus with hyperglycemia: Secondary | ICD-10-CM | POA: Diagnosis not present

## 2015-03-20 DIAGNOSIS — I5023 Acute on chronic systolic (congestive) heart failure: Secondary | ICD-10-CM | POA: Diagnosis not present

## 2015-03-20 DIAGNOSIS — I480 Paroxysmal atrial fibrillation: Secondary | ICD-10-CM | POA: Diagnosis not present

## 2015-03-20 DIAGNOSIS — I2510 Atherosclerotic heart disease of native coronary artery without angina pectoris: Secondary | ICD-10-CM | POA: Diagnosis not present

## 2015-03-25 ENCOUNTER — Ambulatory Visit (HOSPITAL_COMMUNITY)
Admission: RE | Admit: 2015-03-25 | Discharge: 2015-03-25 | Disposition: A | Discharge status: Home / Self Care | Payer: Medicare Other | Source: Ambulatory Visit | Attending: Cardiology | Admitting: Cardiology

## 2015-03-25 VITALS — BP 106/58 | HR 95 | Wt 207.8 lb

## 2015-03-25 DIAGNOSIS — I2510 Atherosclerotic heart disease of native coronary artery without angina pectoris: Secondary | ICD-10-CM | POA: Diagnosis not present

## 2015-03-25 DIAGNOSIS — Z7901 Long term (current) use of anticoagulants: Secondary | ICD-10-CM | POA: Diagnosis not present

## 2015-03-25 DIAGNOSIS — E785 Hyperlipidemia, unspecified: Secondary | ICD-10-CM | POA: Insufficient documentation

## 2015-03-25 DIAGNOSIS — G4733 Obstructive sleep apnea (adult) (pediatric): Secondary | ICD-10-CM | POA: Diagnosis not present

## 2015-03-25 DIAGNOSIS — E119 Type 2 diabetes mellitus without complications: Secondary | ICD-10-CM | POA: Insufficient documentation

## 2015-03-25 DIAGNOSIS — Z8673 Personal history of transient ischemic attack (TIA), and cerebral infarction without residual deficits: Secondary | ICD-10-CM | POA: Insufficient documentation

## 2015-03-25 DIAGNOSIS — Z79899 Other long term (current) drug therapy: Secondary | ICD-10-CM | POA: Diagnosis not present

## 2015-03-25 DIAGNOSIS — N189 Chronic kidney disease, unspecified: Secondary | ICD-10-CM | POA: Diagnosis not present

## 2015-03-25 DIAGNOSIS — I255 Ischemic cardiomyopathy: Secondary | ICD-10-CM | POA: Diagnosis not present

## 2015-03-25 DIAGNOSIS — Z8546 Personal history of malignant neoplasm of prostate: Secondary | ICD-10-CM | POA: Diagnosis not present

## 2015-03-25 DIAGNOSIS — I129 Hypertensive chronic kidney disease with stage 1 through stage 4 chronic kidney disease, or unspecified chronic kidney disease: Secondary | ICD-10-CM | POA: Insufficient documentation

## 2015-03-25 DIAGNOSIS — I4901 Ventricular fibrillation: Secondary | ICD-10-CM | POA: Diagnosis not present

## 2015-03-25 DIAGNOSIS — Z7982 Long term (current) use of aspirin: Secondary | ICD-10-CM | POA: Insufficient documentation

## 2015-03-25 DIAGNOSIS — K219 Gastro-esophageal reflux disease without esophagitis: Secondary | ICD-10-CM | POA: Diagnosis not present

## 2015-03-25 DIAGNOSIS — I5022 Chronic systolic (congestive) heart failure: Secondary | ICD-10-CM | POA: Insufficient documentation

## 2015-03-25 DIAGNOSIS — I480 Paroxysmal atrial fibrillation: Secondary | ICD-10-CM | POA: Diagnosis not present

## 2015-03-25 LAB — TSH: TSH: 3.207 u[IU]/mL (ref 0.350–4.500)

## 2015-03-25 LAB — T4, FREE: FREE T4: 1.61 ng/dL (ref 0.80–1.80)

## 2015-03-25 MED ORDER — LOSARTAN POTASSIUM 25 MG PO TABS: 25.0000 mg | ORAL_TABLET | Freq: Two times a day (BID) | ORAL | Status: DC

## 2015-03-25 NOTE — Progress Notes (Signed)
Patient ID: [name], male   DOB: August 03, 1956, 59 y.o.   MRN: [id number] PCP: Dr. Gerarda Fraction  59 yo with history of CAD and ischemic cardiomyopathy, BiV ICD upgrade 2009paroxysmal atrial fibrillation, CKD, and recent traumatic SAH presents for evaluation in CHF clinic.  He has been followed at the heart failure clinic at [facility] by Dr Carolynn Serve in the past.  He was recently (3/16) admitted from the [place] with exertional dyspnea/volume overload and had a cardiac arrest/ventricular fibrillation while in the hospital terminated by his ICD.  After diuresis, RHC showed relatively normal filling pressures and preserved cardiac index.  Echo in 3/16 showed EF 25-30% with restrictive diastolic function.  Cardiolite was done, showing areas of scar but no ischemia.  EF 18%.    He returns for HF follow up. Last visit amio decreased to 200 mg daily and metoprolol was increased. Complaining of dry cough. Nonproductive cough. Able to walk 10 minutes at a time. Taking all medications. Weight at home 199-201 pounds. He takes lasix about once a week for 3 pound weight gain.   Labs (3/16): K 3.8, creatinine 2.78 Labs (03/10/2015): K 4.5 Creatinine 2.49  TSH 4.58, LFTs normal, digoxin 0.8  PMH: 1. HTN 2. Type II diabetes 3. CAD: s/p BMS LAD in 2001, PTCA ramus and BMS LAD in 2009.  Cardiolite (3/16) with EF 18%, no ischemia, prior anterior, apical and inferior infarction.  4. Chronic systolic CHF: Ischemic CMP.  St Jude CRT-D.  Echo (3/16) with EF 25-30%, restrictive diastolic function, mild LVH, mild MR.  RHC (3/16) with mean RA 9, PA 47/29, mean PCWP 16, CI 2.5 (Fick).  Suspect ACEI cough.  5. SAH: 1/16 after fall (traumatic).   6. GERD 7. Atrial fibrillation: Paroxysmal, on amiodarone. 8. Rectal cancer: s/p surgery. Has colostomy.  9. Prostate cancer: s/p chemo/radiation.  10. H/o TIA 11. OSA: On CPAP.  12. Hyperlipidemia 13. CKD  SH: Married, lives in [place], nonsmoker.   FH: CAD  ROS: All  systems reviewed and negative except as per HPI.   Current Outpatient Prescriptions  Medication Sig Dispense Refill  . acetaminophen (TYLENOL) 500 MG tablet Take 1,000 mg by mouth every 6 (six) hours as needed for moderate pain.    Marland Kitchen amiodarone (PACERONE) 200 MG tablet Take 1 tablet (200 mg total) by mouth daily. 30 tablet 6  . apixaban (ELIQUIS) 5 MG TABS tablet Take 5 mg by mouth 2 (two) times daily.    Marland Kitchen aspirin EC 81 MG tablet Take 81 mg by mouth daily.    Marland Kitchen co-enzyme Q-10 50 MG capsule Take 50 mg by mouth every morning.     . digoxin (LANOXIN) 0.125 MG tablet Take 1 tablet by mouth every other day.  0  . enalapril (VASOTEC) 10 MG tablet Take 10 mg by mouth daily.     . fluticasone (FLONASE) 50 MCG/ACT nasal spray Place 1 spray into both nostrils daily as needed for allergies.     . furosemide (LASIX) 40 MG tablet Take 1 tablet (40 mg total) by mouth daily as needed. 30 tablet 0  . HYDROcodone-acetaminophen (NORCO/VICODIN) 5-325 MG per tablet Take 1-2 tablets by mouth every 6 (six) hours as needed. 30 tablet 0  . isosorbide dinitrate (ISORDIL) 30 MG tablet Take 15 mg by mouth 2 (two) times daily.     . metoprolol succinate (TOPROL-XL) 25 MG 24 hr tablet Take 50mg  (2 tablets) in am and 25mg  (1 tablet) in pm. 90 tablet 3  .  nitroGLYCERIN (NITROLINGUAL) 0.4 MG/SPRAY spray Place 1 spray under the tongue every 5 (five) minutes as needed. angina 12 g 3  . Omega-3 Fatty Acids (FISH OIL) 1200 MG CAPS Take 1,200 mg by mouth 2 (two) times daily.      . pantoprazole (PROTONIX) 40 MG tablet Take 1 tablet (40 mg total) by mouth daily. 30 tablet 6  . ranolazine (RANEXA) 500 MG 12 hr tablet Take 1 tablet (500 mg total) by mouth 2 (two) times daily. 60 tablet 6  . rosuvastatin (CRESTOR) 40 MG tablet Take 1 tablet (40 mg total) by mouth at bedtime. 90 tablet 3   No current facility-administered medications for this encounter.   BP 106/58 mmHg  Pulse 95  Wt 207 lb 12 oz (94.235 kg)  SpO2 100% General:  NAD Neck: No JVD, no thyromegaly or thyroid nodule.  Lungs: Clear to auscultation bilaterally with normal respiratory effort. CV: Nondisplaced PMI.  Heart regular S1/S2, no S3/S4, 2/6 SEM RUSB.  1+ ankle edema.  No carotid bruit.  Normal pedal pulses.  Abdomen: Soft, nontender, no hepatosplenomegaly, no distention. S/p colostomy.  Skin: Intact without lesions or rashes.  Neurologic: Alert and oriented x 3.  Psych: Normal affect. Extremities: No clubbing or cyanosis.  HEENT: Normal.   Assessment/Plan: 1. Chronic systolic CHF: Ischemic cardiomyopathy.  Has been followed for a long time at [facility].  Deemed not transplant or VAD candidate with comorbities and advanced CKD. NYHA II. BiV St Jude, nearing ERI. Maintaining NSR.  - Continue current digoxin, level was normal recently.  - He will continue to use Lasix as needed for weight gain.   - Having dry cough. Stop Ace and switch to losartan 50 mg daily with BMET in 10 days.  - Continue Toprol XL to 50 qam, 25 qpm.  2. Atrial fibrillation: Paroxysmal.  In NSR today.  He is on amiodarone and Eliquis.  Had traumatic SAH in 1/16 but stable back on Eliquis.  - Continue amio to 200 mg daily.  - TSH higher recently, check TSH T3 T4 now.  Will need at least yearly eye exam. LFTs normal recently.  3. CAD: No recent angina.  He is on ASA 81 daily and statin. Angina controlled with Ranexa and isordil.  4. CKD: Stable creatinine most recently.   Sees Dr Lowanda Foster in [place].  5. Ventricular fibrillation: Device ERI. Amber NP with EP verified. On amiodarone, 200 mg daily (and increasing Toprol XL). EP will contact to set up time to replace device.   CLEGG,AMY NP-C  03/25/2015  Patient seen with NP, agree with the above note.  He is stable.  Chronic dry cough may be due to enalapril, will transition to losartan 50 mg daily with BMET in 10 days.  Need to recheck TFTs given mildly elevated TSH recently.  Device at [facility] => per EP.  May followup in 2 months.    Loralie Champagne 03/25/2015 11:21 AM

## 2015-03-25 NOTE — Patient Instructions (Signed)
Stop Enalapril  Start Losartan 25 mg Twice daily   Labs today  We will contact you in 2 months to schedule your next appointment.

## 2015-03-26 ENCOUNTER — Telehealth: Payer: Self-pay | Admitting: *Deleted

## 2015-03-26 ENCOUNTER — Encounter: Payer: Self-pay | Admitting: *Deleted

## 2015-03-26 DIAGNOSIS — N184 Chronic kidney disease, stage 4 (severe): Secondary | ICD-10-CM | POA: Diagnosis not present

## 2015-03-26 DIAGNOSIS — Z950 Presence of cardiac pacemaker: Secondary | ICD-10-CM

## 2015-03-26 DIAGNOSIS — E1165 Type 2 diabetes mellitus with hyperglycemia: Secondary | ICD-10-CM | POA: Diagnosis not present

## 2015-03-26 DIAGNOSIS — Z01818 Encounter for other preprocedural examination: Secondary | ICD-10-CM

## 2015-03-26 DIAGNOSIS — I480 Paroxysmal atrial fibrillation: Secondary | ICD-10-CM | POA: Diagnosis not present

## 2015-03-26 DIAGNOSIS — I2510 Atherosclerotic heart disease of native coronary artery without angina pectoris: Secondary | ICD-10-CM | POA: Diagnosis not present

## 2015-03-26 DIAGNOSIS — I5023 Acute on chronic systolic (congestive) heart failure: Secondary | ICD-10-CM | POA: Diagnosis not present

## 2015-03-26 DIAGNOSIS — E1122 Type 2 diabetes mellitus with diabetic chronic kidney disease: Secondary | ICD-10-CM | POA: Diagnosis not present

## 2015-03-26 LAB — T3, FREE: T3 FREE: 2.1 pg/mL (ref 2.0–4.4)

## 2015-03-26 NOTE — Telephone Encounter (Signed)
-----   Message from Sharlot Gowda sent at 03/26/2015  4:08 PM EDT ----- So Claiborne Billings received a text from [employer] that pt was checked in Heart Failure Clinic and device reached ERI on 03-24-15. So pt does need to be scheduled for gen change.   Erasmo Downer  ----- Message -----    From: Levonne Hubert, LPN    Sent: 6/60/6301  12:16 PM      To: Sharlot Gowda  Pt seen in [place] office 4/13. No device report in Epic on that date. Has he reached ERI?   ----- Message -----    From: Dionicio Stall, RN    Sent: 03/26/2015   8:49 AM      To: Marikay Alar Arian Mcquitty, LPN  Patient needs to be set up for a generator change.  Does not need to come back to the office as he was just seen 03/19/15.  Thanks  [employer]

## 2015-03-27 DIAGNOSIS — E6609 Other obesity due to excess calories: Secondary | ICD-10-CM | POA: Diagnosis not present

## 2015-03-27 DIAGNOSIS — Z6831 Body mass index (BMI) 31.0-31.9, adult: Secondary | ICD-10-CM | POA: Diagnosis not present

## 2015-03-27 DIAGNOSIS — E1165 Type 2 diabetes mellitus with hyperglycemia: Secondary | ICD-10-CM | POA: Diagnosis not present

## 2015-04-03 ENCOUNTER — Inpatient Hospital Stay (HOSPITAL_COMMUNITY)
Admission: EM | Admit: 2015-04-03 | Discharge: 2015-04-06 | DRG: 445 | Disposition: A | Discharge status: Home / Self Care | Payer: Medicare Other | Attending: Internal Medicine | Admitting: Internal Medicine

## 2015-04-03 ENCOUNTER — Encounter (HOSPITAL_COMMUNITY): Payer: Self-pay | Admitting: *Deleted

## 2015-04-03 ENCOUNTER — Emergency Department (HOSPITAL_COMMUNITY): Payer: Medicare Other

## 2015-04-03 DIAGNOSIS — R945 Abnormal results of liver function studies: Secondary | ICD-10-CM | POA: Diagnosis not present

## 2015-04-03 DIAGNOSIS — Z79899 Other long term (current) drug therapy: Secondary | ICD-10-CM

## 2015-04-03 DIAGNOSIS — R7401 Elevation of levels of liver transaminase levels: Secondary | ICD-10-CM

## 2015-04-03 DIAGNOSIS — Z8249 Family history of ischemic heart disease and other diseases of the circulatory system: Secondary | ICD-10-CM

## 2015-04-03 DIAGNOSIS — E785 Hyperlipidemia, unspecified: Secondary | ICD-10-CM | POA: Diagnosis present

## 2015-04-03 DIAGNOSIS — Z9221 Personal history of antineoplastic chemotherapy: Secondary | ICD-10-CM

## 2015-04-03 DIAGNOSIS — K629 Disease of anus and rectum, unspecified: Secondary | ICD-10-CM | POA: Diagnosis present

## 2015-04-03 DIAGNOSIS — I2510 Atherosclerotic heart disease of native coronary artery without angina pectoris: Secondary | ICD-10-CM | POA: Diagnosis present

## 2015-04-03 DIAGNOSIS — N3091 Cystitis, unspecified with hematuria: Secondary | ICD-10-CM | POA: Diagnosis not present

## 2015-04-03 DIAGNOSIS — I129 Hypertensive chronic kidney disease with stage 1 through stage 4 chronic kidney disease, or unspecified chronic kidney disease: Secondary | ICD-10-CM | POA: Diagnosis present

## 2015-04-03 DIAGNOSIS — K8080 Other cholelithiasis without obstruction: Secondary | ICD-10-CM | POA: Diagnosis not present

## 2015-04-03 DIAGNOSIS — R310 Gross hematuria: Secondary | ICD-10-CM | POA: Diagnosis present

## 2015-04-03 DIAGNOSIS — Z955 Presence of coronary angioplasty implant and graft: Secondary | ICD-10-CM | POA: Diagnosis not present

## 2015-04-03 DIAGNOSIS — N184 Chronic kidney disease, stage 4 (severe): Secondary | ICD-10-CM | POA: Diagnosis present

## 2015-04-03 DIAGNOSIS — N3001 Acute cystitis with hematuria: Secondary | ICD-10-CM | POA: Diagnosis not present

## 2015-04-03 DIAGNOSIS — Z7982 Long term (current) use of aspirin: Secondary | ICD-10-CM

## 2015-04-03 DIAGNOSIS — B9689 Other specified bacterial agents as the cause of diseases classified elsewhere: Secondary | ICD-10-CM | POA: Diagnosis not present

## 2015-04-03 DIAGNOSIS — K8010 Calculus of gallbladder with chronic cholecystitis without obstruction: Secondary | ICD-10-CM | POA: Diagnosis not present

## 2015-04-03 DIAGNOSIS — Z8673 Personal history of transient ischemic attack (TIA), and cerebral infarction without residual deficits: Secondary | ICD-10-CM | POA: Diagnosis not present

## 2015-04-03 DIAGNOSIS — E669 Obesity, unspecified: Secondary | ICD-10-CM | POA: Diagnosis present

## 2015-04-03 DIAGNOSIS — Z8546 Personal history of malignant neoplasm of prostate: Secondary | ICD-10-CM

## 2015-04-03 DIAGNOSIS — E1122 Type 2 diabetes mellitus with diabetic chronic kidney disease: Secondary | ICD-10-CM | POA: Diagnosis present

## 2015-04-03 DIAGNOSIS — I482 Chronic atrial fibrillation, unspecified: Secondary | ICD-10-CM | POA: Diagnosis present

## 2015-04-03 DIAGNOSIS — I255 Ischemic cardiomyopathy: Secondary | ICD-10-CM | POA: Diagnosis present

## 2015-04-03 DIAGNOSIS — K7689 Other specified diseases of liver: Secondary | ICD-10-CM | POA: Diagnosis not present

## 2015-04-03 DIAGNOSIS — Z95810 Presence of automatic (implantable) cardiac defibrillator: Secondary | ICD-10-CM | POA: Diagnosis not present

## 2015-04-03 DIAGNOSIS — Z85048 Personal history of other malignant neoplasm of rectum, rectosigmoid junction, and anus: Secondary | ICD-10-CM | POA: Diagnosis not present

## 2015-04-03 DIAGNOSIS — K6289 Other specified diseases of anus and rectum: Secondary | ICD-10-CM | POA: Diagnosis not present

## 2015-04-03 DIAGNOSIS — S36119A Unspecified injury of liver, initial encounter: Secondary | ICD-10-CM

## 2015-04-03 DIAGNOSIS — E1165 Type 2 diabetes mellitus with hyperglycemia: Secondary | ICD-10-CM | POA: Diagnosis present

## 2015-04-03 DIAGNOSIS — Z7901 Long term (current) use of anticoagulants: Secondary | ICD-10-CM | POA: Diagnosis not present

## 2015-04-03 DIAGNOSIS — K8020 Calculus of gallbladder without cholecystitis without obstruction: Secondary | ICD-10-CM | POA: Diagnosis not present

## 2015-04-03 DIAGNOSIS — I5022 Chronic systolic (congestive) heart failure: Secondary | ICD-10-CM | POA: Diagnosis present

## 2015-04-03 DIAGNOSIS — N189 Chronic kidney disease, unspecified: Secondary | ICD-10-CM | POA: Diagnosis not present

## 2015-04-03 DIAGNOSIS — N390 Urinary tract infection, site not specified: Secondary | ICD-10-CM | POA: Diagnosis present

## 2015-04-03 DIAGNOSIS — Z6828 Body mass index (BMI) 28.0-28.9, adult: Secondary | ICD-10-CM

## 2015-04-03 DIAGNOSIS — Z933 Colostomy status: Secondary | ICD-10-CM | POA: Diagnosis not present

## 2015-04-03 DIAGNOSIS — N179 Acute kidney failure, unspecified: Secondary | ICD-10-CM | POA: Diagnosis present

## 2015-04-03 DIAGNOSIS — K811 Chronic cholecystitis: Secondary | ICD-10-CM | POA: Diagnosis not present

## 2015-04-03 DIAGNOSIS — E1129 Type 2 diabetes mellitus with other diabetic kidney complication: Secondary | ICD-10-CM | POA: Diagnosis present

## 2015-04-03 DIAGNOSIS — G4733 Obstructive sleep apnea (adult) (pediatric): Secondary | ICD-10-CM | POA: Diagnosis present

## 2015-04-03 DIAGNOSIS — Z923 Personal history of irradiation: Secondary | ICD-10-CM | POA: Diagnosis not present

## 2015-04-03 DIAGNOSIS — IMO0002 Reserved for concepts with insufficient information to code with codable children: Secondary | ICD-10-CM | POA: Diagnosis present

## 2015-04-03 DIAGNOSIS — K219 Gastro-esophageal reflux disease without esophagitis: Secondary | ICD-10-CM | POA: Diagnosis present

## 2015-04-03 DIAGNOSIS — C61 Malignant neoplasm of prostate: Secondary | ICD-10-CM | POA: Diagnosis not present

## 2015-04-03 DIAGNOSIS — R319 Hematuria, unspecified: Secondary | ICD-10-CM | POA: Diagnosis not present

## 2015-04-03 DIAGNOSIS — Z800 Family history of malignant neoplasm of digestive organs: Secondary | ICD-10-CM

## 2015-04-03 DIAGNOSIS — I480 Paroxysmal atrial fibrillation: Secondary | ICD-10-CM | POA: Diagnosis present

## 2015-04-03 DIAGNOSIS — R740 Nonspecific elevation of levels of transaminase and lactic acid dehydrogenase [LDH]: Secondary | ICD-10-CM

## 2015-04-03 DIAGNOSIS — R109 Unspecified abdominal pain: Secondary | ICD-10-CM

## 2015-04-03 DIAGNOSIS — J309 Allergic rhinitis, unspecified: Secondary | ICD-10-CM | POA: Diagnosis present

## 2015-04-03 DIAGNOSIS — K828 Other specified diseases of gallbladder: Secondary | ICD-10-CM | POA: Diagnosis not present

## 2015-04-03 DIAGNOSIS — R1011 Right upper quadrant pain: Secondary | ICD-10-CM | POA: Diagnosis not present

## 2015-04-03 HISTORY — DX: Hematuria, unspecified: R31.9

## 2015-04-03 LAB — COMPREHENSIVE METABOLIC PANEL
ALT: 149 U/L — AB (ref 0–53)
AST: 331 U/L — AB (ref 0–37)
Albumin: 2.3 g/dL — ABNORMAL LOW (ref 3.5–5.2)
Alkaline Phosphatase: 457 U/L — ABNORMAL HIGH (ref 39–117)
Anion gap: 9 (ref 5–15)
BILIRUBIN TOTAL: 3.6 mg/dL — AB (ref 0.3–1.2)
BUN: 20 mg/dL (ref 6–23)
CHLORIDE: 103 mmol/L (ref 96–112)
CO2: 20 mmol/L (ref 19–32)
Calcium: 8.6 mg/dL (ref 8.4–10.5)
Creatinine, Ser: 2.97 mg/dL — ABNORMAL HIGH (ref 0.50–1.35)
GFR calc Af Amer: 25 mL/min — ABNORMAL LOW (ref >90)
GFR, EST NON AFRICAN AMERICAN: 21 mL/min — AB (ref >90)
Glucose, Bld: 288 mg/dL — ABNORMAL HIGH (ref 70–99)
POTASSIUM: 3.8 mmol/L (ref 3.5–5.1)
Sodium: 132 mmol/L — ABNORMAL LOW (ref 135–145)
Total Protein: 7 g/dL (ref 6.0–8.3)

## 2015-04-03 LAB — CBC
HEMATOCRIT: 27.3 % — AB (ref 39.0–52.0)
Hemoglobin: 8.6 g/dL — ABNORMAL LOW (ref 13.0–17.0)
MCH: 29.4 pg (ref 26.0–34.0)
MCHC: 31.5 g/dL (ref 30.0–36.0)
MCV: 93.2 fL (ref 78.0–100.0)
Platelets: 178 10*3/uL (ref 150–400)
RBC: 2.93 MIL/uL — ABNORMAL LOW (ref 4.22–5.81)
RDW: 15.4 % (ref 11.5–15.5)
WBC: 8.8 10*3/uL (ref 4.0–10.5)

## 2015-04-03 LAB — APTT: aPTT: 39 seconds — ABNORMAL HIGH (ref 24–37)

## 2015-04-03 LAB — URINE MICROSCOPIC-ADD ON

## 2015-04-03 LAB — URINALYSIS, ROUTINE W REFLEX MICROSCOPIC
Ketones, ur: 15 mg/dL — AB
Nitrite: POSITIVE — AB
PH: 6 (ref 5.0–8.0)
Specific Gravity, Urine: 1.02 (ref 1.005–1.030)
Urobilinogen, UA: 1 mg/dL (ref 0.0–1.0)

## 2015-04-03 LAB — RAPID URINE DRUG SCREEN, HOSP PERFORMED
Amphetamines: NOT DETECTED
BARBITURATES: NOT DETECTED
BENZODIAZEPINES: NOT DETECTED
Cocaine: NOT DETECTED
OPIATES: POSITIVE — AB
Tetrahydrocannabinol: NOT DETECTED

## 2015-04-03 LAB — PROTIME-INR
INR: 1.73 — ABNORMAL HIGH (ref 0.00–1.49)
Prothrombin Time: 20.4 seconds — ABNORMAL HIGH (ref 11.6–15.2)

## 2015-04-03 LAB — ACETAMINOPHEN LEVEL: Acetaminophen (Tylenol), Serum: <10 ug/mL — ABNORMAL LOW (ref 10–30)

## 2015-04-03 LAB — DIGOXIN LEVEL: Digoxin Level: 0.7 ng/mL — ABNORMAL LOW (ref 0.8–2.0)

## 2015-04-03 LAB — LIPASE, BLOOD: Lipase: 28 U/L (ref 11–59)

## 2015-04-03 LAB — RETICULOCYTES
RBC.: 2.96 MIL/uL — AB (ref 4.22–5.81)
Retic Count, Absolute: 115.4 10*3/uL (ref 19.0–186.0)
Retic Ct Pct: 3.9 % — ABNORMAL HIGH (ref 0.4–3.1)

## 2015-04-03 LAB — I-STAT CG4 LACTIC ACID, ED
LACTIC ACID, VENOUS: 1.35 mmol/L (ref 0.5–2.0)
Lactic Acid, Venous: 1.9 mmol/L (ref 0.5–2.0)

## 2015-04-03 LAB — SALICYLATE LEVEL

## 2015-04-03 LAB — ETHANOL

## 2015-04-03 MED ORDER — CEFTRIAXONE SODIUM 1 G IJ SOLR
1.0000 g | Freq: Once | INTRAMUSCULAR | Status: AC
  Administered 2015-04-03: 1 g via INTRAVENOUS
  Filled 2015-04-03: qty 10

## 2015-04-03 MED ORDER — SODIUM CHLORIDE 0.9 % IV SOLN
Freq: Once | INTRAVENOUS | Status: AC
  Administered 2015-04-03: 17:00:00 via INTRAVENOUS

## 2015-04-03 NOTE — H&P (Signed)
Date: 04/04/2015               Patient Name:  [name] MRN: [id number]  DOB: [birth year] Age / Sex: 59 y.o., male   PCP: Redmond School, MD              Medical Service: Internal Medicine Teaching Service              Attending Physician: Dr. Sid Falcon, MD    First Contact: Dr. Ethelene Hal Pager: [phone]  Second Contact: Dr. Gordy Levan Pager: [phone]            After Hours (After 5p/  First Contact Pager: [phone]  weekends / holidays): Second Contact Pager: [phone]   Chief Complaint:  Hematuria.  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 Patient reports 1 week of poor appetite associated with weight loss and intermittent right upper quadrant and epigastric abdominal pain. He also reports nausea and vomiting 2 days ago that is currently resolved.  He reports that the nausea and vomiting was triggered by a meal. He also says that his skin has been getting more yellow over the past couple of weeks. This morning at 11 AM he had acute onset hematuria with clots in his urine. This is associated with some dysuria. He reports having one additional episode upon arrival to the emergency room, but his urine was more clear the last time he urinated. He denies any fevers or chills. He reports previously being told that he had cholecystitis, and he has had similar abdominal pain in the past. He has not pursued surgery to this point due to his cardiac issues, but he reports planning to discuss with a general surgeon in the next couple of weeks.  In the ER, his vital signs were stable. His liver function tests were noted to be elevated, and abdominal ultrasound demonstrated findings consistent with  chronic cholecystitis. This was discussed with general surgery who recommended further workup of cholecystitis with HIDA scan and MRCP given the possibility that his liver findings were due to amiodarone toxicity. His urinalysis was concerning for urinary tract infection, so he was started on ceftriaxone IV.  Review of Systems: Review of Systems  Constitutional: Positive for malaise/fatigue. Negative for fever and chills.  HENT: Negative for congestion and sore throat.   Eyes: Negative for blurred vision.  Respiratory: Negative for cough, shortness of breath and wheezing.   Cardiovascular: Negative for chest pain, palpitations, orthopnea and leg swelling.  Gastrointestinal: Positive for nausea, vomiting and abdominal pain. Negative for heartburn, diarrhea and constipation.  Genitourinary: Positive for dysuria. Negative for urgency and frequency.  Musculoskeletal: Negative for myalgias, back pain and joint pain.  Skin: Negative for rash.  Neurological: Negative for dizziness, sensory change, focal weakness, weakness and headaches.    Meds: Medications Prior to Admission  Medication Sig Dispense Refill  . acetaminophen (TYLENOL) 500 MG tablet Take 1,000 mg by mouth every 6 (six) hours as needed for moderate pain.    Marland Kitchen amiodarone (PACERONE) 200 MG tablet Take 1 tablet (200 mg total) by mouth daily. 30 tablet 6  . apixaban (ELIQUIS) 5 MG TABS tablet Take 5 mg by mouth 2 (two) times daily.    Marland Kitchen aspirin EC 81 MG  tablet Take 81 mg by mouth daily.    Marland Kitchen co-enzyme Q-10 50 MG capsule Take 50 mg by mouth every morning.     . digoxin (LANOXIN) 0.125 MG tablet Take 1 tablet by mouth every other day.  0  . fluticasone (FLONASE) 50 MCG/ACT nasal spray Place 1 spray into both nostrils daily as needed for allergies.     . furosemide (LASIX) 40 MG tablet Take 1 tablet (40 mg total) by mouth daily as needed. (Patient taking differently: Take 40 mg by mouth daily as needed for fluid or edema. ) 30 tablet 0  .  isosorbide dinitrate (ISORDIL) 30 MG tablet Take 15 mg by mouth 2 (two) times daily.     Marland Kitchen losartan (COZAAR) 25 MG tablet Take 1 tablet (25 mg total) by mouth 2 (two) times daily. 60 tablet 3  . metoprolol succinate (TOPROL-XL) 25 MG 24 hr tablet Take 50mg  (2 tablets) in am and 25mg  (1 tablet) in pm. 90 tablet 3  . Omega-3 Fatty Acids (FISH OIL) 1200 MG CAPS Take 1,200 mg by mouth 2 (two) times daily.      . pantoprazole (PROTONIX) 40 MG tablet Take 1 tablet (40 mg total) by mouth daily. 30 tablet 6  . ranolazine (RANEXA) 500 MG 12 hr tablet Take 1 tablet (500 mg total) by mouth 2 (two) times daily. 60 tablet 6  . rosuvastatin (CRESTOR) 40 MG tablet Take 1 tablet (40 mg total) by mouth at bedtime. 90 tablet 3  . HYDROcodone-acetaminophen (NORCO/VICODIN) 5-325 MG per tablet Take 1-2 tablets by mouth every 6 (six) hours as needed. (Patient not taking: Reported on 04/03/2015) 30 tablet 0  . nitroGLYCERIN (NITROLINGUAL) 0.4 MG/SPRAY spray Place 1 spray under the tongue every 5 (five) minutes as needed. angina (Patient taking differently: Place 1 spray under the tongue every 5 (five) minutes as needed for chest pain. angina) 12 g 3   Current Facility-Administered Medications  Medication Dose Route Frequency Provider Last Rate Last Dose  . aspirin EC tablet 81 mg  81 mg Oral Daily Francesca Oman, DO      . digoxin [facility]) tablet 125 mcg  125 mcg Oral QODAY Francesca Oman, DO      . fluticasone [facility]) 50 MCG/ACT nasal spray 1 spray  1 spray Each Nare Daily PRN Francesca Oman, DO      . insulin aspart (novoLOG) injection 0-9 Units  0-9 Units Subcutaneous TID WC Francesca Oman, DO      . isosorbide dinitrate (ISORDIL) tablet 15 mg  15 mg Oral BID Francesca Oman, DO      . metoprolol succinate (TOPROL-XL) 24 hr tablet 25 mg  25 mg Oral QHS Sid Falcon, MD      . metoprolol succinate (TOPROL-XL) 24 hr tablet 50 mg  50 mg Oral Daily Francesca Oman, DO      . nitroGLYCERIN (NITROLINGUAL) 0.4 MG/SPRAY spray 1  spray  1 spray Sublingual Q5 min PRN Francesca Oman, DO      . pantoprazole (PROTONIX) EC tablet 40 mg  40 mg Oral Daily Francesca Oman, DO      . ranolazine (RANEXA) 12 hr tablet 500 mg  500 mg Oral BID Francesca Oman, DO      . sodium chloride 0.9 % injection 3 mL  3 mL Intravenous Q12H Francesca Oman, DO      . sodium chloride 0.9 % injection 3 mL  3 mL Intravenous Q12H Cristie Hem  Ronnie Derby, DO        Allergies: Allergies as of 04/03/2015  . (No Known Allergies)   Past Medical History  Diagnosis Date  . Essential hypertension, benign   . CAD (coronary artery disease)     a. BMS to LAD 2001 at [facility] b. PTCA/atherectomy ramus and BMS to LAD 2009  . Chronic systolic heart failure   . GERD (gastroesophageal reflux disease)   . Paroxysmal atrial fibrillation     a. on amiodarone, digoxin and Eliquis  . Adenocarcinoma of rectum     a. 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 Prostate cancer     a. s/p seed implants with chemo and radiation  . Biventricular ICD (implantable cardioverter-defibrillator) in place 2009    upgrade  . TIA (transient ischemic attack)   . OSA on CPAP   . HLD (hyperlipidemia)   . Orthostatic hypotension   . Dizziness     a. chronic. Admission for this 07/18/2014  . History of falling July 2015    due to dizziness from medications  . Ischemic cardiomyopathy     EF 18% by nuclear study 2016, multiple myocardial infarctions in past    . Chronic kidney disease, stage IV (severe) 12/11/2013  . DM type 2, uncontrolled, with renal complications 05/27/2978  . Obesity 12/12/2013   Past Surgical History  Procedure Laterality Date  . Internal defibrillator and pacemaker  2002  . Abdominal and perineal resection of rectum with total mesorectal excision  10/04/2007  . Colonoscopy  09/14/2011    Dr. Gala Romney: via colostomy, Single pedunculated benign inflammatory polyp. Due for surveillance Oct 2015  . Colostomy  2008  . Colonoscopy N/A 07/02/2014    Procedure: COLONOSCOPY;  Surgeon: Daneil Dolin, MD;   Location: [facility];  Service: Endoscopy;  Laterality: N/A;  7:30 / COLONOSCOPY THRU COLOSTOMY  . Esophagogastroduodenoscopy N/A 07/02/2014    Procedure: ESOPHAGOGASTRODUODENOSCOPY (EGD);  Surgeon: Daneil Dolin, MD;  Location: [facility];  Service: Endoscopy;  Laterality: N/A;  7:30  . Savory dilation N/A 07/02/2014    Procedure: SAVORY DILATION;  Surgeon: Daneil Dolin, MD;  Location: [facility];  Service: Endoscopy;  Laterality: N/A;  7:30  . Maloney dilation N/A 07/02/2014    Procedure: Venia Minks DILATION;  Surgeon: Daneil Dolin, MD;  Location: [facility];  Service: Endoscopy;  Laterality: N/A;  7:30  . Portacath placement  06/2007    "removed ~ 1 yr later"  . Cardiac catheterization  08/2001  . Coronary angioplasty with stent placement  2001; ~ 2006    "1 + 1"   . Left heart catheterization with coronary angiogram N/A 07/13/2013    Procedure: LEFT HEART CATHETERIZATION WITH CORONARY ANGIOGRAM;  Surgeon: Lorretta Harp, MD;  Location[facility] CATH LAB;  Service: Cardiovascular;  Laterality: N/A;  . Colonoscopy N/A 12/11/2014    Dr. Gala Romney via colostomy. Normal. Repeat in 2021.   Marland Kitchen Esophagogastroduodenoscopy N/A 12/11/2014    [id number] EGD  . Cardiac defibrillator placement  2009    Upgraded to a BiV ICD  . Right heart catheterization N/A 02/24/2015    Procedure: RIGHT HEART CATH;  Surgeon: Jolaine Artist, MD;  Location: [facility] CATH LAB;  Service: Cardiovascular;  Laterality: N/A;   Family History  Problem Relation Age of Onset  . Colon cancer Mother 62  . Colon cancer Sister 53  . Coronary artery disease Father   . Colon cancer Other     2 cousins, succumbed to illness  History   Social History  . Marital Status: Married    Spouse Name: N/A  . Number of Children: 1  . Years of Education: N/A   Occupational History  .     Social History Main Topics  . Smoking status: Never Smoker   . Smokeless tobacco: Never Used  . Alcohol Use: No     Comment: Former user 45 years  ago  . Drug Use: No  . Sexual Activity: No   Other Topics Concern  . Not on file   Social History Narrative    Physical Exam: Filed Vitals:   04/03/15 2203  BP: 114/66  Pulse: 73  Temp: 98 F (36.7 C)  Resp: 14   Physical Exam  Constitutional: He is oriented to person, place, and time and well-developed, well-nourished, and in no distress. No distress.  HENT:  Head: Normocephalic and atraumatic.  Mouth/Throat: No oropharyngeal exudate.  Eyes: Conjunctivae and EOM are normal. Pupils are equal, round, and reactive to light. Scleral icterus is present.  Neck: Normal range of motion. Neck supple.  Cardiovascular: Normal rate, regular rhythm and intact distal pulses.  Exam reveals no gallop and no friction rub.   No murmur heard. Pulmonary/Chest: Effort normal and breath sounds normal. No respiratory distress. He has no wheezes.  Abdominal: Soft. Bowel sounds are normal. He exhibits no distension. There is no tenderness.  No costovertebral angle tenderness.  Musculoskeletal: Normal range of motion. He exhibits edema (trace bilateral). He exhibits no tenderness.  Lymphadenopathy:    He has no cervical adenopathy.  Neurological: He is alert and oriented to person, place, and time. No cranial nerve deficit. He exhibits normal muscle tone.  No asterixis.  Skin: Skin is warm and dry. No rash noted. He is not diaphoretic. No erythema.  Yellow appearing skin and conjunctiva.    Lab results: Basic Metabolic Panel:  Recent Labs  04/03/15 1338  NA 132*  K 3.8  CL 103  CO2 20  GLUCOSE 288*  BUN 20  CREATININE 2.97*  CALCIUM 8.6   Liver Function Tests:  Recent Labs  04/03/15 1338  AST 331*  ALT 149*  ALKPHOS 457*  BILITOT 3.6*  PROT 7.0  ALBUMIN 2.3*    Recent Labs  04/03/15 1629  LIPASE 28   CBC:  Recent Labs  04/03/15 1338  WBC 8.8  HGB 8.6*  HCT 27.3*  MCV 93.2  PLT 178   Coagulation:  Recent Labs  04/03/15 1629  LABPROT 20.4*  INR 1.73*    Urinalysis:  Recent Labs  04/03/15 1532  COLORURINE RED*  LABSPEC 1.020  PHURINE 6.0  GLUCOSEU >1000*  HGBUR LARGE*  BILIRUBINUR LARGE*  KETONESUR 15*  PROTEINUR >300*  UROBILINOGEN 1.0  NITRITE POSITIVE*  LEUKOCYTESUR MODERATE*  Rare squamous epithelial cells, 3-6 white blood cells, too numerous to count RBCs, many bacteria.  Lactic acid: 1.90>1.35.  Imaging results:  US Abdomen Complete  04/03/2015   CLINICAL DATA:  Abdominal pain, stage IV renal disease.  EXAM: ULTRASOUND ABDOMEN COMPLETE  COMPARISON:  CT 02/07/2015  FINDINGS: Gallbladder: Gallbladder is contracted and thick-walled. Several gallstones are noted measuring up to 5 mm. There some debris within the gallbladder. Negative sonographic Murphy's sign.  Common bile duct: Diameter: Upper limits of normal at 6 mm.  Liver: No focal lesion identified. Within normal limits in parenchymal echogenicity.  IVC: Within normal limits  Pancreas: Visualized portion unremarkable.  Spleen: Mildly enlarged spleen with a calculated volume of 657 cubic cm.  Right Kidney:  Length: 11.2 cm. Echogenicity within normal limits. No mass or hydronephrosis visualized.  Left Kidney: Length: 11.4 cm. Echogenicity within normal limits. No mass or hydronephrosis visualized.  Abdominal aorta: No aneurysm visualized.  Other findings: Small right pleural effusion.  IMPRESSION: 1. Contracted gallbladder with wall thickening and gallstones suggests chronic cholecystitis. Negative sonographic Murphy's sign. 2. Common bile duct upper limits of normal. 3. Mild splenomegaly. 4. Small right pleural effusion.   Electronically Signed   By: Suzy Bouchard M.D.   On: 04/03/2015 21:09    Other results: EKG: Atrial sensed ventricular paced rhythm, pacing spikes not apparent on current EKG.  Assessment & Plan by Problem: Principal Problem:   Acute liver disease Active Problems:   History of rectal cancer   Long term current use of anticoagulant therapy   Chronic  systolic heart failure   Paroxysmal atrial fibrillation   Chronic kidney disease, stage IV (severe)   DM type 2, uncontrolled, with renal complications   #Abdominal pain with cholestasis and transaminitis Ultrasound consistent with chronic cholecystitis, but patient is on amiodarone which can lead to hepatotoxicity that may have a similar clinical picture. Amiodarone typically associated with transaminitis and less commonly with cholestasis. Case has been discussed with general surgery who like further workup prior to surgical intervention, and cardiac history may limit surgical options. Per general surgery, HIDA scan cannot be obtained until total bilirubin has decreased. Called to discuss with radiology, but nuclear medicine is gone for the day. His amiodarone was decreased to 200 mg daily during his last cardiology visit, but he has taken his high as 800 mg daily in the past. He has been on amiodarone for several years. No history of hepatitis, and no altered mental status. Reports using Tylenol approximately twice a day. -Admit to MedSurg. -MRCP tomorrow. -Discuss bilirubin parameters for HIDA scan with nuclear medicine tomorrow. -Hold home amiodarone due to concern for toxicity. -Hold home Tylenol in setting of liver injury. -Check acetaminophen and salicylate levels. -Check hepatitis panel. -Repeat CMP in the morning. -Heart healthy diet, nothing by mouth midnight for MRCP tomorrow. -Consult PT and OT.  #Hematuria in the setting of UTI and anticoagulant use Gross hematuria associated with dysuria. Patient does have WBCs and bacteria in his urine suggesting urinary tract infection in conjunction with his symptoms. Hematuria may be exacerbated by use of Eliquis. Eliquis is cleared by the liver, as a result his liver injury may be contributing to his hematuria. Hemoglobin has been trending down over the past few months. -Follow-up urine culture. -Continue ceftriaxone IV. -Hold home  Eliquis. -Monitor for resolution of hematuria with further workup as needed. -Check anemia panel.  #Systolic congestive heart failure Due to ischemic cardiopathy. Multiple admissions over last year. Most recently admitted on 02/19/2015 and had cardiac arrest/ventricular fibrillation terminated by his ICD during the hospitalization. He was diuresed and echo showed EF of 25-30% with restrictive diastolic function. Myoview was done showing scar but no areas of ischemia. He takes Lasix as needed for weight gain, and he last took this twice last week. His ICD has shown an elective replacement indicator, and he is currently scheduled to have his device replaced next week. -Continue home digoxin 125 g every other day. -Check digoxin level. -Hold home Lasix 40 mg when necessary given euvolemic status. -Continue home isosorbide dinitrate 50 mg twice a day. -Hold home losartan in the setting of infection. -Monitor intake and output.  #Coronary artery disease Status post PCI in 2001 in 2009. -Continue home aspirin  81 mg daily. -Continue home ranolazine 500 mg twice a day. -Hold home Crestor 40 mg daily at bedtime in the setting of liver injury. -Continue nitroglycerin spray when necessary.  #Paroxysmal atrial fibrillation He had a traumatic subarachnoid hemorrhage and January 2016, but he is currently back on Eliquis. -Hold home Eliquis in anticipation of possible procedure and with hematuria. -Hold amiodarone as above. -Continue home metoprolol 50 mg every morning and 25 mg every afternoon.  #History of rectal and prostate cancer Stage III rectal cancer diagnosed in June 2008 status post chemotherapy and radiation followed by colostomy. Status post radiation therapy and 2 years of Lupron therapy for prostate cancer. Early remission. Last oncology in September 2015. -Follow-up as an outpatient. -Consult ostomy team.  #Type 2 diabetes Not currently on any medications, but hemoglobin A1c  persistently greater than 10 on several measurements. -CBGs before meals at bedtime. -Sliding scale insulin. -Repeat hemoglobin A1c.  #Chronic kidney disease, stage IV Unclear baseline with continued elevation over the last several months. Creatinine is up from 2.49 approximately month ago. -Continue to monitor creatinine.  #Allergic rhinitis -Continue home Flonase daily when necessary.  #GERD -Continue home Protonix 40 g daily.  #DVT prophylaxis -SCDs.  Dispo: Disposition is deferred at this time, awaiting improvement of current medical problems. Anticipated discharge in approximately 2-4 day(s).   The patient does have a current PCP Redmond School, MD), therefore will be require OPC follow-up after discharge.   The patient does have transportation limitations that hinder transportation to clinic appointments.   Signed:  Arman Filter, MD, PhD PGY-1 Internal Medicine Teaching Service Pager: [phone] 04/04/2015, 1:23 AM

## 2015-04-03 NOTE — ED Notes (Signed)
Pt states red colored urine with blood clot this am.  Denies back pain or abdominal pain at this time, though states upper abdominal pain x 2 this week.

## 2015-04-03 NOTE — ED Provider Notes (Signed)
CSN: [id number]     Arrival date & time 04/03/15  1256 History   First MD Initiated Contact with Patient 04/03/15 1607     Chief Complaint  Patient presents with  . Hematuria     (Consider location/radiation/quality/duration/timing/severity/associated sxs/prior Treatment) HPI Comments: The patient is a 59 year old male, he has a history of congestive heart failure, also has a history of ischemic heart disease, history of requiring a pacer/defibrillator, on digoxin, amiodarone, also known to have rectal cancer, has a ostomy since 2008, has been diagnosed with atrial fibrillation paroxysmal, on Eliquis, digoxin. He reports approximately 1 week of weight loss, no appetite, intermittent right upper quadrant and epigastric pain, this is intermittent, he is not having no symptoms at this time, he reports that he had acute onset of hematuria at 11:00 AM approximately 5 hours prior to evaluation. He has passed blood in his urine twice today, there is mild dysuria with this, he has no fevers chills nausea vomiting. He did have vomiting 2 days ago but not today. There is no abdominal pain or back pain at this time, denies chest pain or shortness of breath or swelling of the lower extremities at this time. He has not had any recent urinary tract manipulation. He has had multiple admissions to the hospital for congestive heart failure over the last year.  Patient is a 59 y.o. male presenting with hematuria. The history is provided by the patient.  Hematuria    Past Medical History  Diagnosis Date  . Essential hypertension, benign   . CAD (coronary artery disease)     a. BMS to LAD 2001 at [facility] b. PTCA/atherectomy ramus and BMS to LAD 2009  . Chronic systolic heart failure   . GERD (gastroesophageal reflux disease)   . Paroxysmal atrial fibrillation     a. on amiodarone, digoxin and Eliquis  . Adenocarcinoma of rectum     a. 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 Prostate cancer     a. s/p seed implants with chemo and  radiation  . Biventricular ICD (implantable cardioverter-defibrillator) in place 2009    upgrade  . TIA (transient ischemic attack)   . OSA on CPAP   . HLD (hyperlipidemia)   . Orthostatic hypotension   . Dizziness     a. chronic. Admission for this 07/18/2014  . History of falling July 2015    due to dizziness from medications  . Ischemic cardiomyopathy     EF 18% by nuclear study 2016, multiple myocardial infarctions in past    . Chronic kidney disease, stage IV (severe) 12/11/2013  . DM type 2, uncontrolled, with renal complications 9/62/2297  . Obesity 12/12/2013   Past Surgical History  Procedure Laterality Date  . Internal defibrillator and pacemaker  2002  . Abdominal and perineal resection of rectum with total mesorectal excision  10/04/2007  . Colonoscopy  09/14/2011    Dr. Gala Romney: via colostomy, Single pedunculated benign inflammatory polyp. Due for surveillance Oct 2015  . Colostomy  2008  . Colonoscopy N/A 07/02/2014    Procedure: COLONOSCOPY;  Surgeon: Daneil Dolin, MD;  [facility]: [facility];  Service: Endoscopy;  Laterality: N/A;  7:30 / COLONOSCOPY THRU COLOSTOMY  . Esophagogastroduodenoscopy N/A 07/02/2014    Procedure: ESOPHAGOGASTRODUODENOSCOPY (EGD);  Surgeon: Daneil Dolin, MD;  Location: [facility];  Service: Endoscopy;  Laterality: N/A;  7:30  . Savory dilation N/A 07/02/2014    Procedure: SAVORY DILATION;  Surgeon: Daneil Dolin, MD;  Location: [facility];  Service: Endoscopy;  Laterality: N/A;  7:30  . Maloney dilation N/A 07/02/2014    Procedure: Venia Minks DILATION;  Surgeon: Daneil Dolin, MD;  Location: [facility];  Service: Endoscopy;  Laterality: N/A;  7:30  . Portacath placement  06/2007    "removed ~ 1 yr later"  . Cardiac catheterization  08/2001  . Coronary angioplasty with stent placement  2001; ~ 2006    "1 + 1"   . Left heart catheterization with coronary angiogram N/A 07/13/2013    Procedure: LEFT HEART CATHETERIZATION WITH CORONARY  ANGIOGRAM;  Surgeon: Lorretta Harp, MD;  Location: [facility] CATH LAB;  Service: Cardiovascular;  Laterality: N/A;  . Colonoscopy N/A 12/11/2014    Dr. Gala Romney via colostomy. Normal. Repeat in 2021.   Marland Kitchen Esophagogastroduodenoscopy N/A 12/11/2014    [id number] EGD  . Cardiac defibrillator placement  2009    Upgraded to a BiV ICD  . Right heart catheterization N/A 02/24/2015    Procedure: RIGHT HEART CATH;  Surgeon: Jolaine Artist, MD;  Location: [facility] CATH LAB;  Service: Cardiovascular;  Laterality: N/A;   Family History  Problem Relation Age of Onset  . Colon cancer Mother 3  . Colon cancer Sister 31  . Coronary artery disease Father   . Colon cancer Other     2 cousins, succumbed to illness   History  Substance Use Topics  . Smoking status: Never Smoker   . Smokeless tobacco: Never Used  . Alcohol Use: No     Comment: Former user 45 years ago    Review of Systems  Genitourinary: Positive for hematuria.  All other systems reviewed and are negative.     Allergies  Review of patient's allergies indicates no known allergies.  Home Medications   Prior to Admission medications   Medication Sig Start Date End Date Taking? Authorizing Provider  acetaminophen (TYLENOL) 500 MG tablet Take 1,000 mg by mouth every 6 (six) hours as needed for moderate pain.   Yes Historical Provider, MD  amiodarone (PACERONE) 200 MG tablet Take 1 tablet (200 mg total) by mouth daily. 03/10/15  Yes Larey Dresser, MD  apixaban (ELIQUIS) 5 MG TABS tablet Take 5 mg by mouth 2 (two) times daily.   Yes Historical Provider, MD  aspirin EC 81 MG tablet Take 81 mg by mouth daily.   Yes Historical Provider, MD  co-enzyme Q-10 50 MG capsule Take 50 mg by mouth every morning.    Yes Historical Provider, MD  digoxin (LANOXIN) 0.125 MG tablet Take 1 tablet by mouth every other day. 10/29/14  Yes Historical Provider, MD  fluticasone (FLONASE) 50 MCG/ACT nasal spray Place 1 spray into both nostrils daily as needed for  allergies.  11/21/13  Yes Historical Provider, MD  furosemide (LASIX) 40 MG tablet Take 1 tablet (40 mg total) by mouth daily as needed. Patient taking differently: Take 40 mg by mouth daily as needed for fluid or edema.  02/10/15  Yes Arnoldo Lenis, MD  isosorbide dinitrate (ISORDIL) 30 MG tablet Take 15 mg by mouth 2 (two) times daily.  07/31/14  Yes Historical Provider, MD  losartan (COZAAR) 25 MG tablet Take 1 tablet (25 mg total) by mouth 2 (two) times daily. 03/25/15  Yes Larey Dresser, MD  metoprolol succinate (TOPROL-XL) 25 MG 24 hr tablet Take 50mg  (2 tablets) in am and 25mg  (1 tablet) in pm. 03/10/15  Yes Larey Dresser, MD  Omega-3 Fatty Acids (FISH OIL) 1200 MG CAPS Take 1,200 mg by mouth 2 (  two) times daily.     Yes Historical Provider, MD  pantoprazole (PROTONIX) 40 MG tablet Take 1 tablet (40 mg total) by mouth daily. 07/19/14  Yes Arnoldo Lenis, MD  ranolazine (RANEXA) 500 MG 12 hr tablet Take 1 tablet (500 mg total) by mouth 2 (two) times daily. 02/27/15  Yes Amy D Clegg, NP  rosuvastatin (CRESTOR) 40 MG tablet Take 1 tablet (40 mg total) by mouth at bedtime. 09/27/14  Yes Arnoldo Lenis, MD  HYDROcodone-acetaminophen (NORCO/VICODIN) 5-325 MG per tablet Take 1-2 tablets by mouth every 6 (six) hours as needed. Patient not taking: Reported on 04/03/2015 01/17/15   Orvil Feil, NP  nitroGLYCERIN (NITROLINGUAL) 0.4 MG/SPRAY spray Place 1 spray under the tongue every 5 (five) minutes as needed. angina Patient taking differently: Place 1 spray under the tongue every 5 (five) minutes as needed for chest pain. angina 07/19/14   Arnoldo Lenis, MD   BP 107/68 mmHg  Pulse 73  Temp(Src) 97.8 F (36.6 C) (Oral)  Resp 16  Ht 5\' 8"  (1.727 m)  Wt 203 lb 11.2 oz (92.398 kg)  BMI 30.98 kg/m2  SpO2 99% Physical Exam  Constitutional: He appears well-developed and well-nourished. No distress.  HENT:  Head: Normocephalic and atraumatic.  Mouth/Throat: Oropharynx is clear and moist. No  oropharyngeal exudate.  Eyes: EOM are normal. Pupils are equal, round, and reactive to light. Right eye exhibits no discharge. Left eye exhibits no discharge. Scleral icterus is present.  Neck: Normal range of motion. Neck supple. No JVD present. No thyromegaly present.  Cardiovascular: Normal rate, regular rhythm, normal heart sounds and intact distal pulses.  Exam reveals no gallop and no friction rub.   No murmur heard. Pulmonary/Chest: Effort normal and breath sounds normal. No respiratory distress. He has no wheezes. He has no rales.  Abdominal: Soft. Bowel sounds are normal. He exhibits no distension and no mass. There is no tenderness.  No abdominal tenderness, no distention, no guarding, no hepatosplenomegaly  Musculoskeletal: Normal range of motion. He exhibits no edema or tenderness.  Lymphadenopathy:    He has no cervical adenopathy.  Neurological: He is alert. Coordination normal.  Skin: Skin is warm and dry. No rash noted. No erythema.  Psychiatric: He has a normal mood and affect. His behavior is normal.  Nursing note and vitals reviewed.   ED Course  Procedures (including critical care time) Labs Review Labs Reviewed  COMPREHENSIVE METABOLIC PANEL - Abnormal; Notable for the following:    Sodium 132 (*)    Glucose, Bld 288 (*)    Creatinine, Ser 2.97 (*)    Albumin 2.3 (*)    AST 331 (*)    ALT 149 (*)    Alkaline Phosphatase 457 (*)    Total Bilirubin 3.6 (*)    GFR calc non Af Amer 21 (*)    GFR calc Af Amer 25 (*)    All other components within normal limits  CBC - Abnormal; Notable for the following:    RBC 2.93 (*)    Hemoglobin 8.6 (*)    HCT 27.3 (*)    All other components within normal limits  URINALYSIS, ROUTINE W REFLEX MICROSCOPIC - Abnormal; Notable for the following:    Color, Urine RED (*)    APPearance TURBID (*)    Glucose, UA >1000 (*)    Hgb urine dipstick LARGE (*)    Bilirubin Urine LARGE (*)    Ketones, ur 15 (*)    Protein, ur >300  (*)  Nitrite POSITIVE (*)    Leukocytes, UA MODERATE (*)    All other components within normal limits  PROTIME-INR - Abnormal; Notable for the following:    Prothrombin Time 20.4 (*)    INR 1.73 (*)    All other components within normal limits  APTT - Abnormal; Notable for the following:    aPTT 39 (*)    All other components within normal limits  URINE MICROSCOPIC-ADD ON - Abnormal; Notable for the following:    Bacteria, UA MANY (*)    All other components within normal limits  URINE CULTURE  LIPASE, BLOOD  I-STAT CG4 LACTIC ACID, ED  I-STAT CG4 LACTIC ACID, ED    Imaging Review US Abdomen Complete  04/03/2015   CLINICAL DATA:  Abdominal pain, stage IV renal disease.  EXAM: ULTRASOUND ABDOMEN COMPLETE  COMPARISON:  CT 02/07/2015  FINDINGS: Gallbladder: Gallbladder is contracted and thick-walled. Several gallstones are noted measuring up to 5 mm. There some debris within the gallbladder. Negative sonographic Murphy's sign.  Common bile duct: Diameter: Upper limits of normal at 6 mm.  Liver: No focal lesion identified. Within normal limits in parenchymal echogenicity.  IVC: Within normal limits  Pancreas: Visualized portion unremarkable.  Spleen: Mildly enlarged spleen with a calculated volume of 657 cubic cm.  Right Kidney: Length: 11.2 cm. Echogenicity within normal limits. No mass or hydronephrosis visualized.  Left Kidney: Length: 11.4 cm. Echogenicity within normal limits. No mass or hydronephrosis visualized.  Abdominal aorta: No aneurysm visualized.  Other findings: Small right pleural effusion.  IMPRESSION: 1. Contracted gallbladder with wall thickening and gallstones suggests chronic cholecystitis. Negative sonographic Murphy's sign. 2. Common bile duct upper limits of normal. 3. Mild splenomegaly. 4. Small right pleural effusion.   Electronically Signed   By: Suzy Bouchard M.D.   On: 04/03/2015 21:09     EKG Interpretation   Date/Time:  Thursday April 03 2015 16:26:33  EDT Ventricular Rate:  74 PR Interval:  67 QRS Duration: 171 QT Interval:  484 QTC Calculation: 537 R Axis:   172 Text Interpretation:  Sinus rhythm Short PR interval Nonspecific  intraventricular conduction delay Nonspecific ST depression since last  tracing no significant change Confirmed by Nedim Oki  MD, Clance Baquero ([id number]) on  04/03/2015 4:34:07 PM      MDM   Final diagnoses:  Abdominal pain  Transaminitis  UTI (lower urinary tract infection)  Hematuria    Other than having dry mucous membranes the patient does not appear acutely ill. His blood pressure is approximately 809 systolic, no tachycardia, no abdominal tenderness though he does have significant findings on his blood work including elevated liver function tests, elevated bilirubin and decreased albumin. Creatinine appears baseline with a creatinine of 2.9, glucose 288 and mild hyponatremia. The patient will need further evaluation as to the source of his transaminitis, this could be related to amiodarone toxicity as well. He is on blood thinners which could be causing the hematuria.  Ultrasound report discussed with Dr. Brantley Stage of general surgery, he is agreeable to see the patient should he have findings of surgical pathology however ultrasound does not show significant surgical problem, signs of chronic cholecystitis, recommends HIDA scan and MRCP. This was discussed with the internal medicine resident, they will admit for further evaluation and stabilizing therapy, amiodarone may be the source of the patient's symptoms as well. Antibiotics given for urinary tract infection   Meds given in ED:  Medications  0.9 %  sodium chloride infusion ( Intravenous New Bag/Given 04/03/15  1705)  cefTRIAXone (ROCEPHIN) 1 g in dextrose 5 % 50 mL IVPB (0 g Intravenous Stopped 04/03/15 1735)    New Prescriptions   No medications on file      Noemi Chapel, MD 04/03/15 2201

## 2015-04-04 ENCOUNTER — Inpatient Hospital Stay (HOSPITAL_COMMUNITY): Payer: Medicare Other

## 2015-04-04 ENCOUNTER — Encounter (HOSPITAL_COMMUNITY): Payer: Self-pay | Admitting: General Practice

## 2015-04-04 DIAGNOSIS — F1021 Alcohol dependence, in remission: Secondary | ICD-10-CM

## 2015-04-04 DIAGNOSIS — B9689 Other specified bacterial agents as the cause of diseases classified elsewhere: Secondary | ICD-10-CM

## 2015-04-04 DIAGNOSIS — I5022 Chronic systolic (congestive) heart failure: Secondary | ICD-10-CM

## 2015-04-04 DIAGNOSIS — R945 Abnormal results of liver function studies: Secondary | ICD-10-CM

## 2015-04-04 DIAGNOSIS — J309 Allergic rhinitis, unspecified: Secondary | ICD-10-CM

## 2015-04-04 DIAGNOSIS — N3091 Cystitis, unspecified with hematuria: Secondary | ICD-10-CM

## 2015-04-04 DIAGNOSIS — Z7982 Long term (current) use of aspirin: Secondary | ICD-10-CM

## 2015-04-04 DIAGNOSIS — K219 Gastro-esophageal reflux disease without esophagitis: Secondary | ICD-10-CM

## 2015-04-04 DIAGNOSIS — K811 Chronic cholecystitis: Secondary | ICD-10-CM | POA: Diagnosis present

## 2015-04-04 DIAGNOSIS — Z85048 Personal history of other malignant neoplasm of rectum, rectosigmoid junction, and anus: Secondary | ICD-10-CM

## 2015-04-04 DIAGNOSIS — N390 Urinary tract infection, site not specified: Secondary | ICD-10-CM

## 2015-04-04 DIAGNOSIS — E1165 Type 2 diabetes mellitus with hyperglycemia: Secondary | ICD-10-CM

## 2015-04-04 DIAGNOSIS — R319 Hematuria, unspecified: Secondary | ICD-10-CM

## 2015-04-04 DIAGNOSIS — Z8546 Personal history of malignant neoplasm of prostate: Secondary | ICD-10-CM

## 2015-04-04 DIAGNOSIS — Z7901 Long term (current) use of anticoagulants: Secondary | ICD-10-CM

## 2015-04-04 DIAGNOSIS — E1122 Type 2 diabetes mellitus with diabetic chronic kidney disease: Secondary | ICD-10-CM

## 2015-04-04 DIAGNOSIS — N179 Acute kidney failure, unspecified: Secondary | ICD-10-CM

## 2015-04-04 DIAGNOSIS — I255 Ischemic cardiomyopathy: Secondary | ICD-10-CM

## 2015-04-04 DIAGNOSIS — I480 Paroxysmal atrial fibrillation: Secondary | ICD-10-CM

## 2015-04-04 DIAGNOSIS — N184 Chronic kidney disease, stage 4 (severe): Secondary | ICD-10-CM

## 2015-04-04 LAB — TSH: TSH: 2.331 u[IU]/mL (ref 0.350–4.500)

## 2015-04-04 LAB — COMPREHENSIVE METABOLIC PANEL
ALT: 105 U/L — ABNORMAL HIGH (ref 0–53)
AST: 165 U/L — AB (ref 0–37)
Albumin: 2.2 g/dL — ABNORMAL LOW (ref 3.5–5.2)
Alkaline Phosphatase: 381 U/L — ABNORMAL HIGH (ref 39–117)
Anion gap: 10 (ref 5–15)
BUN: 21 mg/dL (ref 6–23)
CO2: 20 mmol/L (ref 19–32)
Calcium: 8.5 mg/dL (ref 8.4–10.5)
Chloride: 104 mmol/L (ref 96–112)
Creatinine, Ser: 3.02 mg/dL — ABNORMAL HIGH (ref 0.50–1.35)
GFR calc Af Amer: 24 mL/min — ABNORMAL LOW (ref >90)
GFR, EST NON AFRICAN AMERICAN: 21 mL/min — AB (ref >90)
Glucose, Bld: 137 mg/dL — ABNORMAL HIGH (ref 70–99)
Potassium: 3.2 mmol/L — ABNORMAL LOW (ref 3.5–5.1)
SODIUM: 134 mmol/L — AB (ref 135–145)
TOTAL PROTEIN: 6.7 g/dL (ref 6.0–8.3)
Total Bilirubin: 2.3 mg/dL — ABNORMAL HIGH (ref 0.3–1.2)

## 2015-04-04 LAB — CBC WITH DIFFERENTIAL/PLATELET
BASOS ABS: 0 10*3/uL (ref 0.0–0.1)
BASOS PCT: 0 % (ref 0–1)
EOS ABS: 0.2 10*3/uL (ref 0.0–0.7)
Eosinophils Relative: 2 % (ref 0–5)
HCT: 25.2 % — ABNORMAL LOW (ref 39.0–52.0)
Hemoglobin: 7.8 g/dL — ABNORMAL LOW (ref 13.0–17.0)
Lymphocytes Relative: 11 % — ABNORMAL LOW (ref 12–46)
Lymphs Abs: 1.1 10*3/uL (ref 0.7–4.0)
MCH: 28.6 pg (ref 26.0–34.0)
MCHC: 31 g/dL (ref 30.0–36.0)
MCV: 92.3 fL (ref 78.0–100.0)
MONO ABS: 0.6 10*3/uL (ref 0.1–1.0)
MONOS PCT: 6 % (ref 3–12)
NEUTROS PCT: 81 % — AB (ref 43–77)
Neutro Abs: 8.1 10*3/uL — ABNORMAL HIGH (ref 1.7–7.7)
Platelets: 184 10*3/uL (ref 150–400)
RBC: 2.73 MIL/uL — ABNORMAL LOW (ref 4.22–5.81)
RDW: 15.3 % (ref 11.5–15.5)
WBC: 10 10*3/uL (ref 4.0–10.5)

## 2015-04-04 LAB — FERRITIN: FERRITIN: 122 ng/mL (ref 22–322)

## 2015-04-04 LAB — IRON AND TIBC
Iron: 85 ug/dL (ref 42–165)
Saturation Ratios: 23 % (ref 20–55)
TIBC: 370 ug/dL (ref 215–435)
UIBC: 285 ug/dL (ref 125–400)

## 2015-04-04 LAB — GLUCOSE, CAPILLARY
GLUCOSE-CAPILLARY: 163 mg/dL — AB (ref 70–99)
Glucose-Capillary: 132 mg/dL — ABNORMAL HIGH (ref 70–99)
Glucose-Capillary: 149 mg/dL — ABNORMAL HIGH (ref 70–99)
Glucose-Capillary: 155 mg/dL — ABNORMAL HIGH (ref 70–99)

## 2015-04-04 LAB — PROTIME-INR
INR: 1.53 — ABNORMAL HIGH (ref 0.00–1.49)
PROTHROMBIN TIME: 18.5 s — AB (ref 11.6–15.2)

## 2015-04-04 LAB — FOLATE: FOLATE: 6.8 ng/mL

## 2015-04-04 LAB — HEPATITIS PANEL, ACUTE
HCV Ab: NEGATIVE
HEP A IGM: NONREACTIVE
HEP B S AG: NEGATIVE
Hep B C IgM: NONREACTIVE

## 2015-04-04 LAB — VITAMIN B12: Vitamin B-12: 559 pg/mL (ref 211–911)

## 2015-04-04 LAB — CK: CK TOTAL: 16 U/L (ref 7–232)

## 2015-04-04 MED ORDER — INSULIN ASPART 100 UNIT/ML ~~LOC~~ SOLN: 0.0000 [IU] | Freq: Three times a day (TID) | SUBCUTANEOUS | Status: DC

## 2015-04-04 MED ORDER — SODIUM CHLORIDE 0.9 % IJ SOLN
3.0000 mL | Freq: Two times a day (BID) | INTRAMUSCULAR | Status: DC
  Administered 2015-04-04 – 2015-04-06 (×4): 3 mL via INTRAVENOUS

## 2015-04-04 MED ORDER — MORPHINE SULFATE 4 MG/ML IJ SOLN
3.6300 mg | Freq: Once | INTRAMUSCULAR | Status: AC
  Administered 2015-04-04: 3.63 mg via INTRAVENOUS

## 2015-04-04 MED ORDER — PANTOPRAZOLE SODIUM 40 MG PO TBEC
40.0000 mg | DELAYED_RELEASE_TABLET | Freq: Every day | ORAL | Status: DC
Start: 2015-04-04 — End: 2015-04-04

## 2015-04-04 MED ORDER — INSULIN ASPART 100 UNIT/ML ~~LOC~~ SOLN
0.0000 [IU] | Freq: Three times a day (TID) | SUBCUTANEOUS | Status: DC
  Administered 2015-04-04: 2 [IU] via SUBCUTANEOUS
  Administered 2015-04-05 – 2015-04-06 (×3): 3 [IU] via SUBCUTANEOUS
  Administered 2015-04-06: 1 [IU] via SUBCUTANEOUS

## 2015-04-04 MED ORDER — METOPROLOL SUCCINATE ER 25 MG PO TB24
25.0000 mg | ORAL_TABLET | Freq: Every day | ORAL | Status: DC
  Administered 2015-04-04 – 2015-04-05 (×3): 25 mg via ORAL
  Filled 2015-04-04 (×4): qty 1

## 2015-04-04 MED ORDER — NITROGLYCERIN 0.4 MG/SPRAY TL SOLN: 1.0000 | Status: DC | PRN

## 2015-04-04 MED ORDER — METOPROLOL SUCCINATE ER 25 MG PO TB24
50.0000 mg | ORAL_TABLET | Freq: Every day | ORAL | Status: DC
  Administered 2015-04-04: 50 mg via ORAL
  Filled 2015-04-04 (×3): qty 2

## 2015-04-04 MED ORDER — MORPHINE SULFATE 4 MG/ML IJ SOLN: 4.0000 mg | Freq: Once | INTRAMUSCULAR | Status: DC

## 2015-04-04 MED ORDER — ISOSORBIDE DINITRATE 5 MG PO TABS
15.0000 mg | ORAL_TABLET | Freq: Two times a day (BID) | ORAL | Status: DC
  Administered 2015-04-04 – 2015-04-05 (×3): 15 mg via ORAL
  Filled 2015-04-04 (×7): qty 1

## 2015-04-04 MED ORDER — MORPHINE SULFATE 4 MG/ML IJ SOLN
INTRAMUSCULAR | Status: AC
  Filled 2015-04-04: qty 1

## 2015-04-04 MED ORDER — ASPIRIN EC 81 MG PO TBEC
81.0000 mg | DELAYED_RELEASE_TABLET | Freq: Every day | ORAL | Status: DC
  Administered 2015-04-04 – 2015-04-06 (×3): 81 mg via ORAL
  Filled 2015-04-04 (×3): qty 1

## 2015-04-04 MED ORDER — SODIUM CHLORIDE 0.9 % IJ SOLN
3.0000 mL | Freq: Two times a day (BID) | INTRAMUSCULAR | Status: DC
Start: 2015-04-04 — End: 2015-04-04
  Administered 2015-04-04: 3 mL via INTRAVENOUS

## 2015-04-04 MED ORDER — FLUTICASONE PROPIONATE 50 MCG/ACT NA SUSP: 1.0000 | Freq: Every day | NASAL | Status: DC | PRN

## 2015-04-04 MED ORDER — POTASSIUM CHLORIDE CRYS ER 20 MEQ PO TBCR
40.0000 meq | EXTENDED_RELEASE_TABLET | Freq: Once | ORAL | Status: AC
  Administered 2015-04-04: 40 meq via ORAL
  Filled 2015-04-04 (×2): qty 2

## 2015-04-04 MED ORDER — CEFTRIAXONE SODIUM IN DEXTROSE 20 MG/ML IV SOLN
1.0000 g | Freq: Once | INTRAVENOUS | Status: DC
Start: 2015-04-04 — End: 2015-04-04
  Filled 2015-04-04: qty 50

## 2015-04-04 MED ORDER — CEFTRIAXONE SODIUM IN DEXTROSE 20 MG/ML IV SOLN
1.0000 g | INTRAVENOUS | Status: DC
  Administered 2015-04-04 – 2015-04-05 (×2): 1 g via INTRAVENOUS
  Filled 2015-04-04 (×3): qty 50

## 2015-04-04 MED ORDER — TECHNETIUM TC 99M MEBROFENIN IV KIT
5.0000 | PACK | Freq: Once | INTRAVENOUS | Status: AC | PRN
  Administered 2015-04-04: 5 via INTRAVENOUS

## 2015-04-04 MED ORDER — DIGOXIN 125 MCG PO TABS
125.0000 ug | ORAL_TABLET | ORAL | Status: DC
  Administered 2015-04-04 – 2015-04-06 (×2): 125 ug via ORAL
  Filled 2015-04-04 (×2): qty 1

## 2015-04-04 MED ORDER — RANOLAZINE ER 500 MG PO TB12
500.0000 mg | ORAL_TABLET | Freq: Two times a day (BID) | ORAL | Status: DC
  Administered 2015-04-04 – 2015-04-05 (×4): 500 mg via ORAL
  Filled 2015-04-04 (×7): qty 1

## 2015-04-04 NOTE — Progress Notes (Signed)
PT Cancellation Note  Patient Details Name: [name] MRN: [id number] DOB: 12/19/1955   Cancelled Treatment:    Reason Eval/Treat Not Completed: PT screened, no needs identified, will sign off.  Thanks.     Irwin Brakeman F 04/04/2015, 1:36 PM  [facility] [phone] (pager)

## 2015-04-04 NOTE — Progress Notes (Signed)
UR complete.  Laporche Martelle RN, MSN 

## 2015-04-04 NOTE — Consult Note (Addendum)
WOC ostomy consult note Pt had colostomy surgery performed several years ago.  He is wearing a closed end pouch which is intact with a good seal. Pt states he is independent with ostomy pouch emptying and changes and will have someone bring in his own supplies from home. He denies any further questions or need for assistance. Please re-consult if further assistance is needed.  Thank-you,  Julien Girt MSN, [place], [place], [place], [place]

## 2015-04-04 NOTE — Progress Notes (Signed)
Radiology dept called to inform this RN that an MRI cannot be completed as ordered because the patient has a defibrillator. Pt's defibrillator was inserted in 2009, making it unsafe for MRI without special documentation from the ICD company. Will inform MD and continue to monitor.

## 2015-04-04 NOTE — Progress Notes (Signed)
Subjective: [name] had no acute events overnight. He says his only complaint is very dark urine with dysuria. He says there have been no blood clots in urine since he was admitted. He denies any abdominal pain, nausea, emesis, fevers, chills, night sweats, chest pain, palpitations, SOB. He says he had a bowel movement yesterday.  Objective: Vital signs in last 24 hours: Filed Vitals:   04/04/15 0100 04/04/15 0450 04/04/15 1047 04/04/15 1048  BP: 114/60 98/54  101/57  Pulse: 75 74 73 73  Temp: 97.6 F (36.4 C) 97.9 F (36.6 C)    TempSrc: Oral Oral    Resp: 18 18    Height: 5\' 10"  (1.778 m)     Weight: 200 lb 4.8 oz (90.855 kg)     SpO2: 97% 99%     Weight change:   Intake/Output Summary (Last 24 hours) at 04/04/15 1238 Last data filed at 04/04/15 1225  Gross per 24 hour  Intake    360 ml  Output    325 ml  Net     35 ml   Gen: A&O x 4, No acute distress, well developed, well nourished, non icteric HEENT: Atraumatic, PERRL, EOMI, sclerae anicteric, moist mucous membranes, no sublingual icterus Heart: Regular rate and rhythm, normal S1 S2, no murmurs, rubs, or gallops Lungs: Clear to auscultation bilaterally, respirations unlabored Abd: Soft, non-tender, non-distended, + bowel sounds, colostomy bag intact, no hepatosplenomegaly, no CVA tenderness, no asterixis Ext: No edema or cyanosis  Lab Results: Basic Metabolic Panel:  Recent Labs Lab 04/03/15 1338 04/04/15 0533  NA 132* 134*  K 3.8 3.2*  CL 103 104  CO2 20 20  GLUCOSE 288* 137*  BUN 20 21  CREATININE 2.97* 3.02*  CALCIUM 8.6 8.5   Liver Function Tests:  Recent Labs Lab 04/03/15 1338 04/04/15 0533  AST 331* 165*  ALT 149* 105*  ALKPHOS 457* 381*  BILITOT 3.6* 2.3*  PROT 7.0 6.7  ALBUMIN 2.3* 2.2*    Recent Labs Lab 04/03/15 1629  LIPASE 28   CBC:  Recent Labs Lab 04/03/15 1338 04/04/15 0533  WBC 8.8 10.0  NEUTROABS  --  8.1*  HGB 8.6* 7.8*  HCT 27.3* 25.2*  MCV 93.2 92.3  PLT  178 184   Cardiac Enzymes:  Recent Labs Lab 04/04/15 0533  CKTOTAL 16   CBG:  Recent Labs Lab 04/04/15 0728 04/04/15 1126  GLUCAP 132* 149*   Thyroid Function Tests:  Recent Labs Lab 04/04/15 0924  TSH 2.331   Coagulation:  Recent Labs Lab 04/03/15 1629 04/04/15 0533  LABPROT 20.4* 18.5*  INR 1.73* 1.53*   Anemia Panel:  Recent Labs Lab 04/03/15 2237  VITAMINB12 559  FOLATE 6.8  FERRITIN 122  RETICCTPCT 3.9*   Urine Drug Screen: Drugs of Abuse     Component Value Date/Time   LABOPIA POSITIVE* 04/03/2015 1532   COCAINSCRNUR NONE DETECTED 04/03/2015 1532   LABBENZ NONE DETECTED 04/03/2015 1532   AMPHETMU NONE DETECTED 04/03/2015 1532   THCU NONE DETECTED 04/03/2015 1532   LABBARB NONE DETECTED 04/03/2015 1532    Alcohol Level:  Recent Labs Lab 04/03/15 2237  ETH <5   Urinalysis:  Recent Labs Lab 04/03/15 1532  COLORURINE RED*  LABSPEC 1.020  PHURINE 6.0  GLUCOSEU >1000*  HGBUR LARGE*  BILIRUBINUR LARGE*  KETONESUR 15*  PROTEINUR >300*  UROBILINOGEN 1.0  NITRITE POSITIVE*  LEUKOCYTESUR MODERATE*   Hepatitis panel negative  Micro Results: No results found for this or any previous visit (from  the past 240 hour(s)). Studies/Results: US Abdomen Complete  04/03/2015   CLINICAL DATA:  Abdominal pain, stage IV renal disease.  EXAM: ULTRASOUND ABDOMEN COMPLETE  COMPARISON:  CT 02/07/2015  FINDINGS: Gallbladder: Gallbladder is contracted and thick-walled. Several gallstones are noted measuring up to 5 mm. There some debris within the gallbladder. Negative sonographic Murphy's sign.  Common bile duct: Diameter: Upper limits of normal at 6 mm.  Liver: No focal lesion identified. Within normal limits in parenchymal echogenicity.  IVC: Within normal limits  Pancreas: Visualized portion unremarkable.  Spleen: Mildly enlarged spleen with a calculated volume of 657 cubic cm.  Right Kidney: Length: 11.2 cm. Echogenicity within normal limits. No mass or  hydronephrosis visualized.  Left Kidney: Length: 11.4 cm. Echogenicity within normal limits. No mass or hydronephrosis visualized.  Abdominal aorta: No aneurysm visualized.  Other findings: Small right pleural effusion.  IMPRESSION: 1. Contracted gallbladder with wall thickening and gallstones suggests chronic cholecystitis. Negative sonographic Murphy's sign. 2. Common bile duct upper limits of normal. 3. Mild splenomegaly. 4. Small right pleural effusion.   Electronically Signed   By: Suzy Bouchard M.D.   On: 04/03/2015 21:09   Medications: I have reviewed the patient's current medications. Scheduled Meds: . aspirin EC  81 mg Oral Daily  . cefTRIAXone (ROCEPHIN)  IV  1 g Intravenous Q24H  . digoxin  125 mcg Oral QODAY  . insulin aspart  0-9 Units Subcutaneous TID WC  . isosorbide dinitrate  15 mg Oral BID  . metoprolol succinate  25 mg Oral QHS  . metoprolol succinate  50 mg Oral Daily  . ranolazine  500 mg Oral BID  . sodium chloride  3 mL Intravenous Q12H   Continuous Infusions:  PRN Meds:.fluticasone, nitroGLYCERIN Assessment/Plan: Principal Problem:   Acute liver disease Active Problems:   History of rectal cancer   Ischemic cardiomyopathy   Long term current use of anticoagulant therapy   Chronic systolic heart failure   Paroxysmal atrial fibrillation   Chronic kidney disease, stage IV (severe)   DM type 2, uncontrolled, with renal complications   Chronic cholecystitis  #Abdominal pain with cholestasis and transaminitis Ultrasound consistent with chronic cholecystitis but may have amiodarone hepatotoxicity, which is typically associated with transaminitis and less commonly with cholestasis. General surgery wanted MRCP (cannot due to ICD) and/or HIDA before possible surgical intervention, and cardiac history may limit surgical options. He can now have HIDA as total bilirubin <3. Amiodarone was decreased to 200 mg daily during last cardiology visit, but was as high as 800 mg  daily in the past and been on for several years. Hepatitis panel negative, no alcohol use, no AMS or asterixis. He has no abdominal complaints this morning and transaminases have come down much overnight ( AST and ALT from 331 and 149 to 165 and 105, respectively). -HIDA scan -Hold home amiodarone due to concern for toxicity. -CMP in am -PT/OT --Telemetry  #Hematuria in the setting of complicated UTI and anticoagulant use Gross hematuria associated with dysuria. Patient does have WBCs and bacteria in his urine suggesting urinary tract infection in conjunction with his symptoms. Hematuria may be exacerbated by use of Eliquis. Hemoglobin has been trending down over the past few months with anemia panel so far w retic 3.9% suggesting body trying to replace -Follow-up urine culture -Continue ceftriaxone IV. -Hold home Eliquis -Monitor for resolution of hematuria with further workup as needed.  #Systolic congestive heart failure Due to ischemic cardiopathy s/p ICD which shows elective replacement indicator scheduled  for next week.  TTE 02/24/15 EF of 25-30% with restrictive diastolic function. Myoview was done showing scar but no areas of ischemia. He takes Lasix as needed for weight gain, and he last took this twice last week.  -Continue home digoxin 125 g every other day. -Hold home Lasix 40 mg when necessary given euvolemic status. -Continue home isosorbide dinitrate 50 mg twice a day. -Hold home losartan in the setting of infection. -Monitor intake and output -telemetry  #Coronary artery disease Status post PCI in 2001 in 2009. -Continue home aspirin 81 mg daily. -Continue home ranolazine 500 mg twice a day. -Hold home Crestor 40 mg daily at bedtime in the setting of liver injury. -Continue nitroglycerin spray when necessary -telemetry  #Paroxysmal atrial fibrillation He had a traumatic subarachnoid hemorrhage and January 2016, but he is currently back on Eliquis. -Hold home Eliquis  in anticipation of possible procedure and with hematuria. -Hold amiodarone as above. -Continue home metoprolol 50 mg every morning and 25 mg every afternoon -telemetry  #History of rectal and prostate cancer Stage III rectal cancer diagnosed in June 2008 status post chemotherapy and radiation followed by colostomy. Status post radiation therapy and 2 years of Lupron therapy for prostate cancer. Early remission. Last oncology in September 2015. -Follow-up as an outpatient. -Consult ostomy team.  #Type 2 diabetes Not currently on any medications, but hemoglobin A1c persistently greater than 10 on several measurements. -CBGs before meals at bedtime. -Sliding scale insulin-sensitive -f/u hemoglobin A1c  #Chronic kidney disease, stage IV Unclear baseline with continued elevation over the last several months. Creatinine is up from 2.49 approximately month ago, stable this morning at 3.02 -Continue to monitor creatinine.  #Allergic rhinitis -Continue home Flonase daily when necessary.  #GERD -Continue home Protonix 40 g daily.  #DVT prophylaxis -SCDs.  Dispo: Disposition is deferred at this time, awaiting improvement of current medical problems.  Anticipated discharge in approximately 2 day(s).   The patient does have a current PCP Redmond School, MD) and does need an [facility] hospital follow-up appointment after discharge.  The patient does not know have transportation limitations that hinder transportation to clinic appointments.  .Services Needed at time of discharge: Y = Yes, Blank = No PT:   OT:   RN:   Equipment:   Other:     LOS: 1 day   Robert Aline, MD 04/04/2015, 12:38 PM

## 2015-04-04 NOTE — ED Notes (Signed)
aTTEMPTED REPORT X1

## 2015-04-04 NOTE — Progress Notes (Signed)
[facility]  Patient Status: Active (receiving services up to time of hospitalization)  [facility] is providing the following services: RN  If patient discharges after hours, please call [phone].   [name] 04/04/2015, 3:35 PM

## 2015-04-04 NOTE — Progress Notes (Signed)
CARE MANAGEMENT NOTE 04/04/2015  Patient:  [name], [name]   Account Number:  [id number]  Date Initiated:  04/04/2015  Documentation initiated by:  Lorne Skeens  Subjective/Objective Assessment:   Patient was admitted with acute liver disease, UTI with hematuria. Lives at home with spouse. Patient is currently active with [facility] for [facility] services.     Action/Plan:   Will follow for discharge needs.   Anticipated DC Date:     Anticipated DC Plan:  [place]         Choice offered to / List presented to:             Status of service:  In process, will continue to follow Medicare Important Message given?   (If response is "NO", the following Medicare IM given date fields will be blank) Date Medicare IM given:   Medicare IM given by:   Date Additional Medicare IM given:   Additional Medicare IM given by:    Discharge Disposition:    Per UR Regulation:  Reviewed for med. necessity/level of care/duration of stay  If discussed at [place] of Stay Meetings, dates discussed:    Comments:  04/04/15 [place], MSN, CM- Spoke with Butch Penny with [place] to notify of patient's admission and verify that patient is currently active for [facility] services.

## 2015-04-04 NOTE — Progress Notes (Addendum)
59yo male c/o blood in urine w/ upper abdominal pain, UA abnormal w/ many bacteria, to begin IV ABX.  Will start Rocephin 1g IV Q24H and monitor CBC and Cx.  Wynona Neat, PharmD, BCPS 04/04/2015 2:04 AM    ADDENDUM  Pharmacy to sign off as no renal adjustments are required for ceftriaxone. Unless the patient has a CNS infection, 1g IV q24h will most likely be adequate for any other type of infection.  Please reconsult if needed.  Thanks!

## 2015-04-04 NOTE — Progress Notes (Addendum)
Inpatient Diabetes Program Recommendations  AACE/ADA: New Consensus Statement on Inpatient Glycemic Control (2013)  Target Ranges:  Prepandial:   less than 140 mg/dL      Peak postprandial:   less than 180 mg/dL (1-2 hours)      Critically ill patients:  140 - 180 mg/dL    Results for [name], [name] (MRN [id number]) as of 04/04/2015 11:29  Ref. Range 12/24/2014 04:14  Hemoglobin A1C Latest Ref Range: <5.7 % 10.6 (H)    Admit with: Hematuria/ Cholecystitis/ Transaminitis  History: DM, CHF, ICD, CAD, CKD4  Home DM Meds: None listed in Home Med Rec  Current DM Orders: Novolog Sensitive SSI tid     **Per Record Review, note that [facility] was admitted to [facility] back in January of this year.  He was supposed to be taking Lantus and Novolog at home however, (per record review) he was not taking his insulin as prescribed.  **Patient was discharged home on 01/06/15 with instructions to take Lantus 12 units QHS + Novolog 6 units tid with meals.  **Patient was also instructed to go follow-up with Dr. Dorris Fetch (Endocrinologist in [place]).  Unsure if patient followed up with Dr. Dorris Fetch.  **Currently patient's CBGs are well controlled on Novolog Sensitive SSI alone: 132/ 149 mg/dl.    Will follow Wyn Quaker RN, MSN, CDE Diabetes Coordinator Inpatient Diabetes Program Team Pager: [phone] (8a-5p)

## 2015-04-05 ENCOUNTER — Inpatient Hospital Stay (HOSPITAL_COMMUNITY): Payer: Medicare Other

## 2015-04-05 DIAGNOSIS — R740 Nonspecific elevation of levels of transaminase and lactic acid dehydrogenase [LDH]: Secondary | ICD-10-CM

## 2015-04-05 DIAGNOSIS — I2510 Atherosclerotic heart disease of native coronary artery without angina pectoris: Secondary | ICD-10-CM

## 2015-04-05 DIAGNOSIS — K8080 Other cholelithiasis without obstruction: Secondary | ICD-10-CM

## 2015-04-05 DIAGNOSIS — Z95818 Presence of other cardiac implants and grafts: Secondary | ICD-10-CM

## 2015-04-05 DIAGNOSIS — N3091 Cystitis, unspecified with hematuria: Secondary | ICD-10-CM

## 2015-04-05 LAB — COMPREHENSIVE METABOLIC PANEL
ALBUMIN: 2.3 g/dL — AB (ref 3.5–5.2)
ALK PHOS: 357 U/L — AB (ref 39–117)
ALT: 79 U/L — ABNORMAL HIGH (ref 0–53)
AST: 94 U/L — ABNORMAL HIGH (ref 0–37)
Anion gap: 9 (ref 5–15)
BUN: 22 mg/dL (ref 6–23)
CHLORIDE: 105 mmol/L (ref 96–112)
CO2: 21 mmol/L (ref 19–32)
Calcium: 8.9 mg/dL (ref 8.4–10.5)
Creatinine, Ser: 2.84 mg/dL — ABNORMAL HIGH (ref 0.50–1.35)
GFR calc non Af Amer: 23 mL/min — ABNORMAL LOW (ref >90)
GFR, EST AFRICAN AMERICAN: 26 mL/min — AB (ref >90)
Glucose, Bld: 128 mg/dL — ABNORMAL HIGH (ref 70–99)
POTASSIUM: 4.6 mmol/L (ref 3.5–5.1)
Sodium: 135 mmol/L (ref 135–145)
Total Bilirubin: 1.7 mg/dL — ABNORMAL HIGH (ref 0.3–1.2)
Total Protein: 7.1 g/dL (ref 6.0–8.3)

## 2015-04-05 LAB — URINE CULTURE

## 2015-04-05 LAB — URINALYSIS, ROUTINE W REFLEX MICROSCOPIC
Bilirubin Urine: NEGATIVE
Glucose, UA: 500 mg/dL — AB
Ketones, ur: NEGATIVE mg/dL
Nitrite: NEGATIVE
PH: 5.5 (ref 5.0–8.0)
Protein, ur: 100 mg/dL — AB
SPECIFIC GRAVITY, URINE: 1.016 (ref 1.005–1.030)
Urobilinogen, UA: 0.2 mg/dL (ref 0.0–1.0)

## 2015-04-05 LAB — CBC
HCT: 26.6 % — ABNORMAL LOW (ref 39.0–52.0)
HEMOGLOBIN: 8.3 g/dL — AB (ref 13.0–17.0)
MCH: 28.6 pg (ref 26.0–34.0)
MCHC: 31.2 g/dL (ref 30.0–36.0)
MCV: 91.7 fL (ref 78.0–100.0)
Platelets: 193 10*3/uL (ref 150–400)
RBC: 2.9 MIL/uL — AB (ref 4.22–5.81)
RDW: 15.2 % (ref 11.5–15.5)
WBC: 8.6 10*3/uL (ref 4.0–10.5)

## 2015-04-05 LAB — URINE MICROSCOPIC-ADD ON

## 2015-04-05 LAB — GLUCOSE, CAPILLARY
GLUCOSE-CAPILLARY: 187 mg/dL — AB (ref 70–99)
Glucose-Capillary: 122 mg/dL — ABNORMAL HIGH (ref 70–99)
Glucose-Capillary: 234 mg/dL — ABNORMAL HIGH (ref 70–99)
Glucose-Capillary: 207 mg/dL — ABNORMAL HIGH (ref 70–99)

## 2015-04-05 IMAGING — CR DG THORACIC SPINE 4+V
3 series · 3 of 3 positions shown · non-contrast
Comparison: Two-view CHEST x-ray 07/30/2013

CLINICAL DATA: Fall from tree.  Upper back pain.

EXAM:
THORACIC SPINE - 4+ VIEW

[view not recorded (1 of 3)]
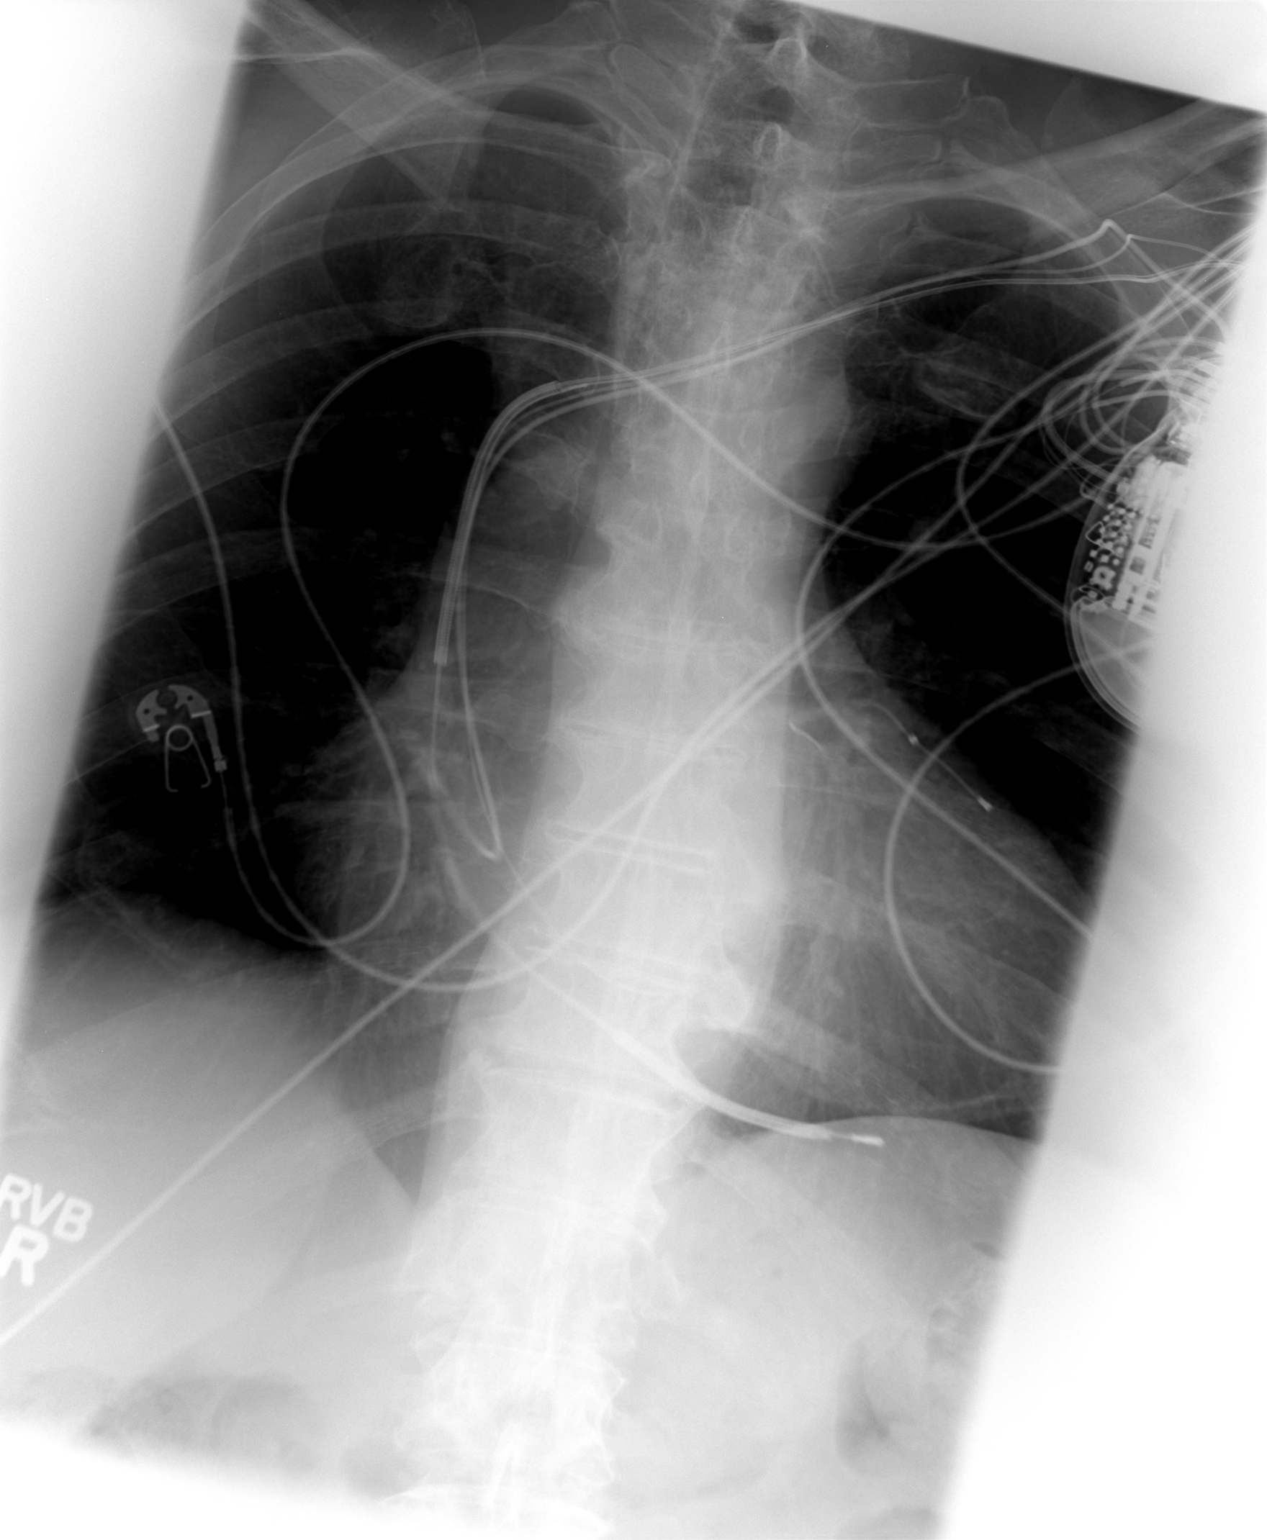

[view not recorded (2 of 3)]
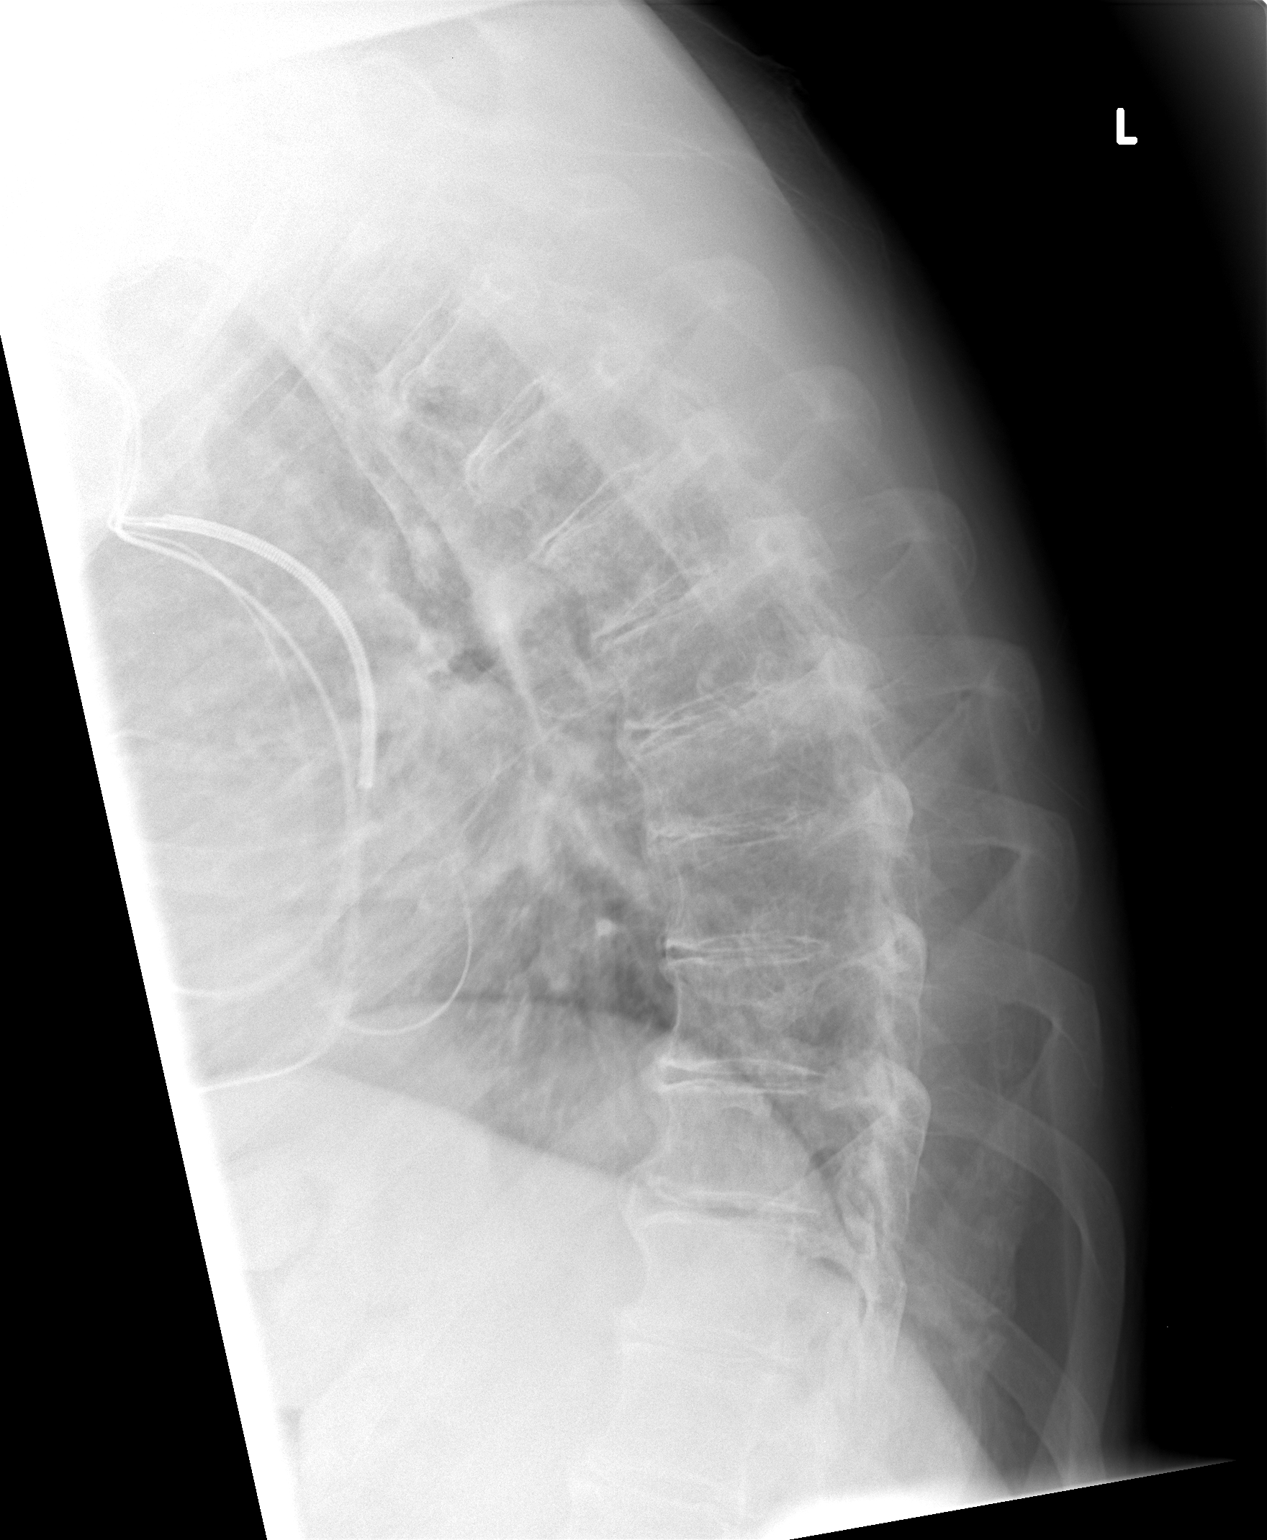

[view not recorded (3 of 3)]
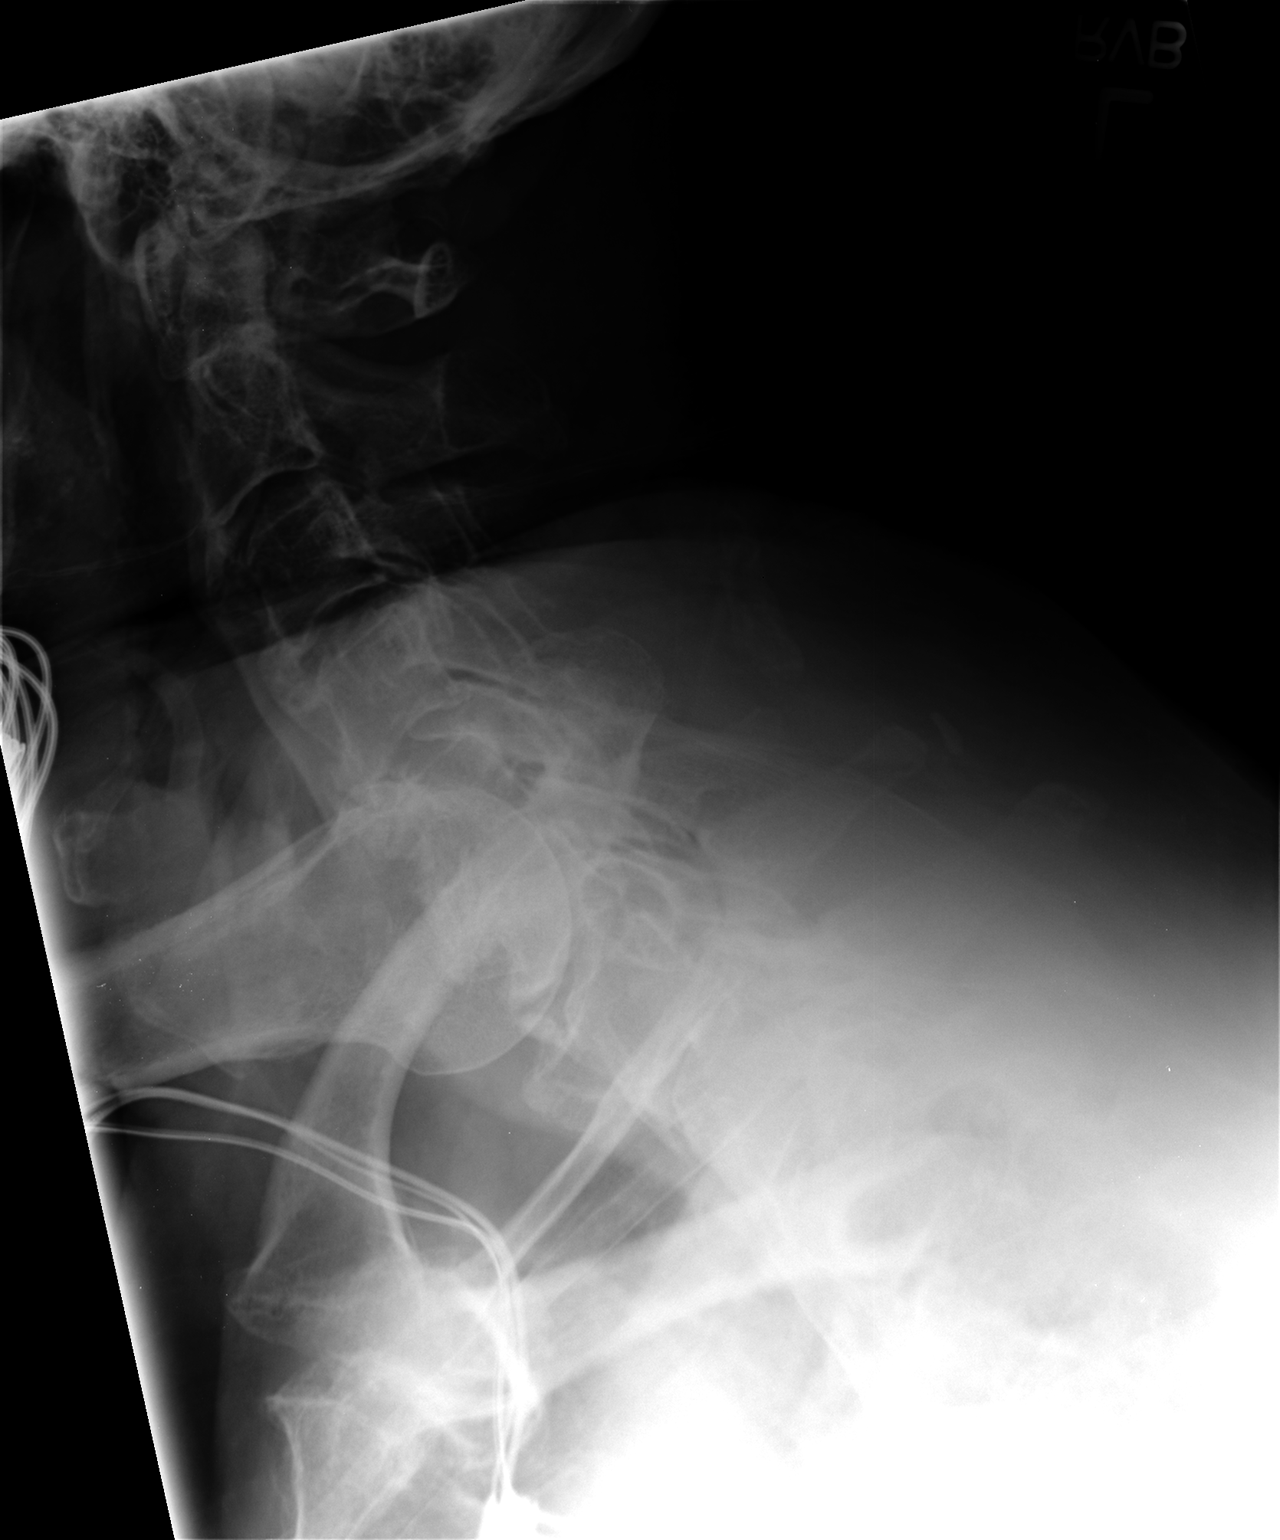

[3 of 3 positions shown; findings below may reference images not displayed]

FINDINGS: Degenerative changes and spurring throughout the thoracic spine.
Normal alignment. No fracture.
IMPRESSION: Degenerative changes.  No acute findings.

## 2015-04-05 NOTE — Progress Notes (Signed)
Subjective: Patient w/ no significant complaints this AM. LFT's improved. Says his urine is still dark in color but somewhat better than yesterday. Says he has passed some clots. Denies significant dysuria, increased frequency, or abdominal pain.   Objective: Vital signs in last 24 hours: Filed Vitals:   04/04/15 1048 04/04/15 2040 04/04/15 2350 04/05/15 0534  BP: 101/57 102/65 113/61 104/61  Pulse:  79 73 73  Temp:  97.9 F (36.6 C) 98.2 F (36.8 C) 97.2 F (36.2 C)  TempSrc:  Oral Oral Oral  Resp: _0 Height:      Weight:    196 lb 6.4 oz (89.086 kg)  SpO2:  99% 99% 98%   Weight change: -7 lb 4.8 oz (-3.311 kg)  Intake/Output Summary (Last 24 hours) at 04/05/15 0852 Last data filed at 04/05/15 0500  Gross per 24 hour  Intake   1010 ml  Output   1250 ml  Net   -240 ml   Physical Exam: General: Mildly jaundiced male, alert, cooperative, NAD. HEENT: PERRL, EOMI. Moist mucus membranes. Icteric.  Neck: Full range of motion without pain, supple, no lymphadenopathy or carotid bruits Lungs: Clear to ascultation bilaterally, normal work of respiration, no wheezes, rales, rhonchi Heart: RRR, no murmurs, gallops, or rubs Abdomen: Soft, non-tender, non-distended, BS + Extremities: No cyanosis, clubbing, or edema Neurologic: Alert & oriented X3, cranial nerves II-XII intact, strength grossly intact, sensation intact to light touch    Lab Results: Basic Metabolic Panel:  Recent Labs Lab 04/04/15 0533 04/05/15 0448  NA 134* 135  K 3.2* 4.6  CL 104 105  CO2 20 21  GLUCOSE 137* 128*  BUN 21 22  CREATININE 3.02* 2.84*  CALCIUM 8.5 8.9   Liver Function Tests:  Recent Labs Lab 04/04/15 0533 04/05/15 0448  AST 165* 94*  ALT 105* 79*  ALKPHOS 381* 357*  BILITOT 2.3* 1.7*  PROT 6.7 7.1  ALBUMIN 2.2* 2.3*    Recent Labs Lab 04/03/15 1629  LIPASE 28   CBC:  Recent Labs Lab 04/03/15 1338 04/04/15 0533  WBC 8.8 10.0  NEUTROABS  --  8.1*  HGB 8.6*  7.8*  HCT 27.3* 25.2*  MCV 93.2 92.3  PLT 178 184   Cardiac Enzymes:  Recent Labs Lab 04/04/15 0533  CKTOTAL 16   CBG:  Recent Labs Lab 04/04/15 0728 04/04/15 1126 04/04/15 1621 04/04/15 2130 04/05/15 0558  GLUCAP 132* 149* 155* 163* 122*   Thyroid Function Tests:  Recent Labs Lab 04/04/15 0924  TSH 2.331   Coagulation:  Recent Labs Lab 04/03/15 1629 04/04/15 0533  LABPROT 20.4* 18.5*  INR 1.73* 1.53*   Anemia Panel:  Recent Labs Lab 04/03/15 2237  VITAMINB12 559  FOLATE 6.8  FERRITIN 122  TIBC 370  IRON 85  RETICCTPCT 3.9*   Urine Drug Screen: Drugs of Abuse     Component Value Date/Time   LABOPIA POSITIVE* 04/03/2015 1532   COCAINSCRNUR NONE DETECTED 04/03/2015 1532   LABBENZ NONE DETECTED 04/03/2015 1532   AMPHETMU NONE DETECTED 04/03/2015 1532   THCU NONE DETECTED 04/03/2015 1532   LABBARB NONE DETECTED 04/03/2015 1532    Alcohol Level:  Recent Labs Lab 04/03/15 2237  ETH <5   Urinalysis:  Recent Labs Lab 04/03/15 1532  COLORURINE RED*  LABSPEC 1.020  PHURINE 6.0  GLUCOSEU >1000*  HGBUR LARGE*  BILIRUBINUR LARGE*  KETONESUR 15*  PROTEINUR >300*  UROBILINOGEN 1.0  NITRITE POSITIVE*  LEUKOCYTESUR MODERATE*   Hepatitis panel negative  Micro Results: Recent Results (from the past 240 hour(s))  Urine culture     Status: None   Collection Time: 04/03/15  3:32 PM  Result Value Ref Range Status   Specimen Description URINE, RANDOM  Final   Special Requests ADD  Final   Colony Count   Final    >=100,000 COLONIES/ML Performed at [facility]    Culture   Final    Multiple bacterial morphotypes present, none predominant. Suggest appropriate recollection if clinically indicated. Performed at [facility]    Report Status 04/05/2015 FINAL  Final   Studies/Results: Nm Hepatobiliary Liver Func  04/04/2015   CLINICAL DATA:  Chronic cholecystitis  EXAM: NUCLEAR MEDICINE HEPATOBILIARY IMAGING  TECHNIQUE:  Sequential images of the abdomen were obtained out to 60 minutes following intravenous administration of radiopharmaceutical.  RADIOPHARMACEUTICALS:  5.3 mCi Millicurie XH-37J Choletec  COMPARISON:  None.  FINDINGS: Mild delay in clearance of blood pool. Uptake in the liver is homogeneous and adequate. The central biliary tree and bowel is seen by 10 minutes. The gallbladder was not visualized at 1 hour and 3.6 mg of morphine was given intravenously. The gallbladder subsequently filled.  IMPRESSION: 1. Patent cystic duct with delayed gallbladder filling. 2. Patent common bile duct.   Electronically Signed   By: Monte Fantasia M.D.   On: 04/04/2015 16:18   US Abdomen Complete  04/03/2015   CLINICAL DATA:  Abdominal pain, stage IV renal disease.  EXAM: ULTRASOUND ABDOMEN COMPLETE  COMPARISON:  CT 02/07/2015  FINDINGS: Gallbladder: Gallbladder is contracted and thick-walled. Several gallstones are noted measuring up to 5 mm. There some debris within the gallbladder. Negative sonographic Murphy's sign.  Common bile duct: Diameter: Upper limits of normal at 6 mm.  Liver: No focal lesion identified. Within normal limits in parenchymal echogenicity.  IVC: Within normal limits  Pancreas: Visualized portion unremarkable.  Spleen: Mildly enlarged spleen with a calculated volume of 657 cubic cm.  Right Kidney: Length: 11.2 cm. Echogenicity within normal limits. No mass or hydronephrosis visualized.  Left Kidney: Length: 11.4 cm. Echogenicity within normal limits. No mass or hydronephrosis visualized.  Abdominal aorta: No aneurysm visualized.  Other findings: Small right pleural effusion.  IMPRESSION: 1. Contracted gallbladder with wall thickening and gallstones suggests chronic cholecystitis. Negative sonographic Murphy's sign. 2. Common bile duct upper limits of normal. 3. Mild splenomegaly. 4. Small right pleural effusion.   Electronically Signed   By: Suzy Bouchard M.D.   On: 04/03/2015 21:09   Medications: I have  reviewed the patient's current medications. Scheduled Meds: . aspirin EC  81 mg Oral Daily  . cefTRIAXone (ROCEPHIN)  IV  1 g Intravenous Q24H  . digoxin  125 mcg Oral QODAY  . insulin aspart  0-9 Units Subcutaneous TID WC  . isosorbide dinitrate  15 mg Oral BID  . metoprolol succinate  25 mg Oral QHS  . metoprolol succinate  50 mg Oral Daily  . ranolazine  500 mg Oral BID  . sodium chloride  3 mL Intravenous Q12H   Continuous Infusions:  PRN Meds:.fluticasone, nitroGLYCERIN   Assessment/Plan: 59 y/o M w/ PMHx of HTN, HLD, CAD s/p BMS to LAD in 2001, chronic sCHF (EF 25-30%) s/p AICD/PPM placement, PAF previously on Eliquis, GERD, CKD stage IV, DM type II, and h/o rectal adenocarcinoma s/p colostomy in 2008, admitted for gross hematuria and transaminitis.   Transaminitis: LFT's improved this AM, AST 94, ALT 79, Alk Phos 357, total bili 1.7. HIDA scan yesterday showed patent  cystic duct w/ delayed GB filling and a patent common bile duct. Given improvement in labs, suspect patient may have passed a gall stone. US on 04/03/15 showed wall thickening w/ gallstones suggestive of chronic cholecystitis. Amiodarone toxicity less likely at this time. Acute hepatitis panel negative.  -Continue to hold Amiodarone for now -Repeat CMP in AM  Hematuria: Most likely UTI in the setting of Eliquis use. Patient still passing dark urine and clots this AM. Repeat CBC pending. Urine culture w/ multiple bacterial morphotypes. Given smoking history, transitional cell CA also possibility.  -CT abdomen/pelvis w/out contrast -Urology consulted -Repeat UA/UCx -CBC pending -Continue Rocephin -Hold home Eliquis  Chronic Systolic CHF: ICM s/p AICD/BiV PPM. For elective replacement next week. TTE 02/24/15 EF of 25-30% with restrictive diastolic function. Myoview was done showing scar but no areas of ischemia. Takes Lasix prn for weight gain. -Continue home digoxin 125 g every other day. -Hold home Lasix given  euvolemia -Continue isosorbide dinitrate 50 mg twice a day. Hold for low BP -Hold Losartan until resolution of UTI/hematuria -I/O's, daily weights -Continue telemetry -Discussed w/ cardiology, patient will undergo PPM replacement as an outpatient after resolution of current issues.   CAD s/p BMS to LAD in 2001: Currently stable as above.  -ASA 81 -Continue Ranexa 500 mg bid -Hold statin given acute liver injury -NTG prn  PAF: Previously on Eliquis, on hold given hematuria. Had a traumatic subarachnoid hemorrhage and January 2016, but he is currently back on Eliquis. This patients CHA2DS2-VASc Score and unadjusted Ischemic Stroke Rate (% per year) is equal to 9.7 % stroke rate/year from a score of 6. Currently A/V paced.  -Hold Eliquis given hematuria -Hold amiodarone as above -Continue Toprol-XL 50 mg in AM and 25 mg in PM -Continue Digoxin 125 mg every other day -Continue telemetry  H/o Rectal and Prostate CA: Stage III Rectal CA diagnosed in June 2008 s/p chemo/radiation w/ colostomy. S/p radiation therapy and 2 years of Lupron therapy for Prostate CA. Last oncology appointment in September 2015. -Urology to see -Follow-up as an outpatient  DM Type II: HbA1c 10.6. Not currently on any medications. -ISS-S w/ CBG's AC/HS -Consider basal insulin vs sulfonurea on discharge  CKD Stage IV: Baseline Cr 2.5 -3.0. Cr 2.84 this AM.  -Repeat CMP in AM  GERD: Stable -PPI  DVT/PE PPx: SCD's  Dispo: Disposition is deferred at this time, awaiting improvement of current medical problems.  Anticipated discharge in approximately 2 day(s).   The patient does have a current PCP (Lawrence Fusco, MD) and does need an [facility] hospital follow-up appointment after discharge.  The patient does not know have transportation limitations that hinder transportation to clinic appointments.  .Services Needed at time of discharge: Y = Yes, Blank = No PT:   OT:   RN:   Equipment:   Other:     LOS: 2 days     W , MD 04/05/2015, 8:52 AM  

## 2015-04-05 NOTE — Progress Notes (Signed)
  Date: 04/05/2015  Patient name: [name]  Medical record number: [id number]  Date of birth: [birth year]   This patient's plan of care was discussed with the house staff. Please see Dr. Ronnald Ramp' note for complete details. I concur with his findings.  [name] reports that he is doing well this morning.  His wife notes that his yellow color is improving.  He reports continued blood loss in the urine along with clots.  He denies any symptoms of urinary retention.  His abdominal pain is completely resolved.   Abdominal pain/transaminitis - Ultrasound with gallstones, Hida scan with normal emptying of gallbladder, LFTs improving close to baseline today.  Would anticipate that he has passed a gallstone.  Will need re-initiation of amiodarone on discharge.  Will likely need to run this by Cardiology to see if we can restart at previous dose vs. Reloading  Hemorrhagic cystitis - On antibiotics, however, he does not have significant clearing of blood and continues to have clots.  CBC from today is improved.  Urology consult.  Repeat UA done and continues to have hematuria.    Sid Falcon, MD 04/05/2015, 10:51 AM

## 2015-04-05 NOTE — Progress Notes (Signed)
BP 92/54. Patient is asymptomatic at the time. No signs or symptoms of dizziness. No verbal complaints. MD notified. Ok to hold scheduled meds. Will continue to monitor patient for further changes in condition.

## 2015-04-05 NOTE — Consult Note (Signed)
Reason for Consult:Gross Hematuria, Chronic Renal Insufficiency, Prostate Cancer, Cystitis  Referring Physician: Luanne Bras MD  [name] is an 59 y.o. male.   HPI:   1 - Gross Hematuria - new gross hematuria 03/2015 with clots prompting medical admission.  He has h/o advanced colorectal cancer (node positive) as well as prostate cancer s/p radiation. UCX this admission with bacterial cystitis. CT non-contrast 02/2015 w/o hydro / stones / masses. He is on anticoagulation at [facility] for A-Fib, but this has been held for several days.    t states his hematuria is acutally improving and nearly resolved grossly on ABX therapy and holding anticoagulation, now minimal bother.   2 - Chronic Renal Insufficiency - lon gh/o insuficiency with Cr 2-4 range x several years. Recent imaging w/o stones / hydro. Denies urinary retention.   3 -  High Risk Prostate Cancer - s/p external beam radiation 2008 for Gleason 8 adenocarcinoma. Most recent PSA 0.1 and stable within last year.  4 - Cystitis - UCX this admission with multipel species, now on empiric rocephin.  Pt normally follows with Dr. Junious [name] for urologic care.  Today " [name] " is seen in consultaiton for above. He is improving clinically and anxious to go home.   Past Medical History  Diagnosis Date  . Essential hypertension, benign   . CAD (coronary artery disease)     a. BMS to LAD 2001 at [facility] b. PTCA/atherectomy ramus and BMS to LAD 2009  . Chronic systolic heart failure   . GERD (gastroesophageal reflux disease)   . Paroxysmal atrial fibrillation     a. on amiodarone, digoxin and Eliquis  . Adenocarcinoma of rectum     a. 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 Prostate cancer     a. s/p seed implants with chemo and radiation  . Biventricular ICD (implantable cardioverter-defibrillator) in place 2009    upgrade  . TIA (transient ischemic attack)   . OSA on CPAP   . HLD (hyperlipidemia)   . Orthostatic hypotension   . Dizziness     a. chronic.  Admission for this 07/18/2014  . History of falling July 2015    due to dizziness from medications  . Ischemic cardiomyopathy     EF 18% by nuclear study 2016, multiple myocardial infarctions in past    . Chronic kidney disease, stage IV (severe) 12/11/2013  . DM type 2, uncontrolled, with renal complications 5/53/7482  . Obesity 12/12/2013  . Hematuria 03/2015    Past Surgical History  Procedure Laterality Date  . Internal defibrillator and pacemaker  2002  . Abdominal and perineal resection of rectum with total mesorectal excision  10/04/2007  . Colonoscopy  09/14/2011    Dr. Gala [name]: via colostomy, Single pedunculated benign inflammatory polyp. Due for surveillance Oct 2015  . Colostomy  2008  . Colonoscopy N/A 07/02/2014    Procedure: COLONOSCOPY;  Surgeon: Robert Dolin, MD;  [facility];  Service: Endoscopy;  Laterality: N/A;  7:30 / COLONOSCOPY THRU COLOSTOMY  . Esophagogastroduodenoscopy N/A 07/02/2014    Procedure: ESOPHAGOGASTRODUODENOSCOPY (EGD);  Surgeon: Robert Dolin, MD;  Location: [facility];  Service: Endoscopy;  Laterality: N/A;  7:30  . Savory dilation N/A 07/02/2014    Procedure: SAVORY DILATION;  Surgeon: Robert Dolin, MD;  Location: [facility];  Service: Endoscopy;  Laterality: N/A;  7:30  . Maloney dilation N/A 07/02/2014    Procedure: [name] DILATION;  Surgeon: Robert Dolin, MD;  Location: [facility];  Service:  Endoscopy;  Laterality: N/A;  7:30  . Portacath placement  06/2007    "removed ~ 1 yr later"  . Cardiac catheterization  08/2001  . Coronary angioplasty with stent placement  2001; ~ 2006    "1 + 1"   . Left heart catheterization with coronary angiogram N/A 07/13/2013    Procedure: LEFT HEART CATHETERIZATION WITH CORONARY ANGIOGRAM;  Surgeon: Lorretta Harp, MD;  Location: [facility] CATH LAB;  Service: Cardiovascular;  Laterality: N/A;  . Colonoscopy N/A 12/11/2014    Dr. Gala [name] via colostomy. Normal. Repeat in 2021.   Marland Kitchen Esophagogastroduodenoscopy  N/A 12/11/2014    HTD:SKAJGO EGD  . Cardiac defibrillator placement  2009    Upgraded to a BiV ICD  . Right heart catheterization N/A 02/24/2015    Procedure: RIGHT HEART CATH;  Surgeon: Jolaine Artist, MD;  Location: [facility] CATH LAB;  Service: Cardiovascular;  Laterality: N/A;    Family History  Problem Relation Age of Onset  . Colon cancer Mother 22  . Colon cancer Sister 85  . Coronary artery disease Father   . Colon cancer Other     2 cousins, succumbed to illness    Social History:  reports that he has never smoked. He has never used smokeless tobacco. He reports that he does not drink alcohol or use illicit drugs.  Allergies: No Known Allergies  Medications: I have reviewed the patient's current medications.  Results for orders placed or performed during the hospital encounter of 04/03/15 (from the past 48 hour(s))  Comprehensive metabolic panel (if pt has a temp above 100.21F)     Status: Abnormal   Collection Time: 04/03/15  1:38 PM  Result Value Ref Range   Sodium 132 (L) 135 - 145 mmol/L   Potassium 3.8 3.5 - 5.1 mmol/L   Chloride 103 96 - 112 mmol/L   CO2 20 19 - 32 mmol/L   Glucose, Bld 288 (H) 70 - 99 mg/dL   BUN 20 6 - 23 mg/dL   Creatinine, Ser 2.97 (H) 0.50 - 1.35 mg/dL   Calcium 8.6 8.4 - 10.5 mg/dL   Total Protein 7.0 6.0 - 8.3 g/dL   Albumin 2.3 (L) 3.5 - 5.2 g/dL   AST 331 (H) 0 - 37 U/L   ALT 149 (H) 0 - 53 U/L   Alkaline Phosphatase 457 (H) 39 - 117 U/L   Total Bilirubin 3.6 (H) 0.3 - 1.2 mg/dL   GFR calc non Af Amer 21 (L) >90 mL/min   GFR calc Af Amer 25 (L) >90 mL/min    Comment: (NOTE) The eGFR has been calculated using the CKD EPI equation. This calculation has not been validated in all clinical situations. eGFR's persistently <90 mL/min signify possible Chronic Kidney Disease.    Anion gap 9 5 - 15  CBC (if pt has a temp above 100.21F)     Status: Abnormal   Collection Time: 04/03/15  1:38 PM  Result Value Ref Range   WBC 8.8 4.0 - 10.5 K/uL    RBC 2.93 (L) 4.22 - 5.81 MIL/uL   Hemoglobin 8.6 (L) 13.0 - 17.0 g/dL   HCT 27.3 (L) 39.0 - 52.0 %   MCV 93.2 78.0 - 100.0 fL   MCH 29.4 26.0 - 34.0 pg   MCHC 31.5 30.0 - 36.0 g/dL   RDW 15.4 11.5 - 15.5 %   Platelets 178 150 - 400 K/uL  Urinalysis, Routine w reflex microscopic (if pt has temp above 100.21F)  Status: Abnormal   Collection Time: 04/03/15  3:32 PM  Result Value Ref Range   Color, Urine RED (A) YELLOW    Comment: BIOCHEMICALS MAY BE AFFECTED BY COLOR   APPearance TURBID (A) CLEAR   Specific Gravity, Urine 1.020 1.005 - 1.030   pH 6.0 5.0 - 8.0   Glucose, UA >1000 (A) NEGATIVE mg/dL   Hgb urine dipstick LARGE (A) NEGATIVE   Bilirubin Urine LARGE (A) NEGATIVE   Ketones, ur 15 (A) NEGATIVE mg/dL   Protein, ur >300 (A) NEGATIVE mg/dL   Urobilinogen, UA 1.0 0.0 - 1.0 mg/dL   Nitrite POSITIVE (A) NEGATIVE   Leukocytes, UA MODERATE (A) NEGATIVE  Urine culture     Status: None   Collection Time: 04/03/15  3:32 PM  Result Value Ref Range   Specimen Description URINE, RANDOM    Special Requests ADD    Colony Count      >=100,000 COLONIES/ML Performed at [facility]    Culture      Multiple bacterial morphotypes present, none predominant. Suggest appropriate recollection if clinically indicated. Performed at [facility]    Report Status 04/05/2015 FINAL   Urine microscopic-add on     Status: Abnormal   Collection Time: 04/03/15  3:32 PM  Result Value Ref Range   Squamous Epithelial / LPF RARE RARE   WBC, UA 3-6 <3 WBC/hpf   RBC / HPF TOO NUMEROUS TO COUNT <3 RBC/hpf   Bacteria, UA MANY (A) RARE  Urine rapid drug screen (hosp performed)     Status: Abnormal   Collection Time: 04/03/15  3:32 PM  Result Value Ref Range   Opiates POSITIVE (A) NONE DETECTED   Cocaine NONE DETECTED NONE DETECTED   Benzodiazepines NONE DETECTED NONE DETECTED   Amphetamines NONE DETECTED NONE DETECTED   Tetrahydrocannabinol NONE DETECTED NONE DETECTED    Barbiturates NONE DETECTED NONE DETECTED    Comment:        DRUG SCREEN FOR MEDICAL PURPOSES ONLY.  IF CONFIRMATION IS NEEDED FOR ANY PURPOSE, NOTIFY LAB WITHIN 5 DAYS.        LOWEST DETECTABLE LIMITS FOR URINE DRUG SCREEN Drug Class       Cutoff (ng/mL) Amphetamine      1000 Barbiturate      200 Benzodiazepine   810 Tricyclics       175 Opiates          300 Cocaine          300 THC              50   Lipase, blood     Status: None   Collection Time: 04/03/15  4:29 PM  Result Value Ref Range   Lipase 28 11 - 59 U/L  Protime-INR     Status: Abnormal   Collection Time: 04/03/15  4:29 PM  Result Value Ref Range   Prothrombin Time 20.4 (H) 11.6 - 15.2 seconds   INR 1.73 (H) 0.00 - 1.49  APTT     Status: Abnormal   Collection Time: 04/03/15  4:29 PM  Result Value Ref Range   aPTT 39 (H) 24 - 37 seconds    Comment:        IF BASELINE aPTT IS ELEVATED, SUGGEST PATIENT RISK ASSESSMENT BE USED TO DETERMINE APPROPRIATE ANTICOAGULANT THERAPY.   I-Stat CG4 Lactic Acid, ED     Status: None   Collection Time: 04/03/15  4:38 PM  Result Value Ref Range   Lactic Acid, Venous  1.90 0.5 - 2.0 mmol/L  I-Stat CG4 Lactic Acid, ED     Status: None   Collection Time: 04/03/15  7:08 PM  Result Value Ref Range   Lactic Acid, Venous 1.35 0.5 - 2.0 mmol/L  Acetaminophen level     Status: Abnormal   Collection Time: 04/03/15 10:37 PM  Result Value Ref Range   Acetaminophen (Tylenol), Serum <10.0 (L) 10 - 30 ug/mL    Comment:        THERAPEUTIC CONCENTRATIONS VARY SIGNIFICANTLY. A RANGE OF 10-30 ug/mL MAY BE AN EFFECTIVE CONCENTRATION FOR MANY PATIENTS. HOWEVER, SOME ARE BEST TREATED AT CONCENTRATIONS OUTSIDE THIS RANGE. ACETAMINOPHEN CONCENTRATIONS >150 ug/mL AT 4 HOURS AFTER INGESTION AND >50 ug/mL AT 12 HOURS AFTER INGESTION ARE OFTEN ASSOCIATED WITH TOXIC REACTIONS.   Digoxin level     Status: Abnormal   Collection Time: 04/03/15 10:37 PM  Result Value Ref Range   Digoxin Level  0.7 (L) 0.8 - 2.0 ng/mL  Salicylate level     Status: None   Collection Time: 04/03/15 10:37 PM  Result Value Ref Range   Salicylate Lvl <4.8 2.8 - 20.0 mg/dL  Ethanol     Status: None   Collection Time: 04/03/15 10:37 PM  Result Value Ref Range   Alcohol, Ethyl (B) <5 0 - 9 mg/dL    Comment:        LOWEST DETECTABLE LIMIT FOR SERUM ALCOHOL IS 11 mg/dL FOR MEDICAL PURPOSES ONLY   Hepatitis panel, acute     Status: None   Collection Time: 04/03/15 10:37 PM  Result Value Ref Range   Hepatitis B Surface Ag NEGATIVE NEGATIVE   HCV Ab NEGATIVE NEGATIVE   Hep A IgM NON REACTIVE NON REACTIVE    Comment: (NOTE) Effective October 21, 2014, Hepatitis Acute Panel (test code [phone]) will be revised to automatically reflex to the Hepatitis C Viral RNA, Quantitative, Real-Time PCR assay if the Hepatitis C antibody screening result is Reactive. This action is being taken to ensure that the CDC/USPSTF recommended HCV diagnostic algorithm with the appropriate test reflex needed for accurate interpretation is followed.    Hep B C IgM NON REACTIVE NON REACTIVE    Comment: (NOTE) High levels of Hepatitis B Core IgM antibody are detectable during the acute stage of Hepatitis B. This antibody is used to differentiate current from past HBV infection. Performed at [facility]   Vitamin B12     Status: None   Collection Time: 04/03/15 10:37 PM  Result Value Ref Range   Vitamin B-12 559 211 - 911 pg/mL    Comment: Performed at [facility]  Folate     Status: None   Collection Time: 04/03/15 10:37 PM  Result Value Ref Range   Folate 6.8 ng/mL    Comment: (NOTE) Reference Ranges        Deficient:       0.4 - 3.3 ng/mL        Indeterminate:   3.4 - 5.4 ng/mL        Normal:              > 5.4 ng/mL Performed at [facility]   Iron and TIBC     Status: None   Collection Time: 04/03/15 10:37 PM  Result Value Ref Range   Iron 85 42 - 165 ug/dL   TIBC 370 215 - 435  ug/dL   Saturation Ratios 23 20 - 55 %   UIBC 285 125 - 400 ug/dL  Comment: Performed at [facility]  Ferritin     Status: None   Collection Time: 04/03/15 10:37 PM  Result Value Ref Range   Ferritin 122 22 - 322 ng/mL    Comment: Performed at [facility]  Reticulocytes     Status: Abnormal   Collection Time: 04/03/15 10:37 PM  Result Value Ref Range   Retic Ct Pct 3.9 (H) 0.4 - 3.1 %   RBC. 2.96 (L) 4.22 - 5.81 MIL/uL   Retic Count, Manual 115.4 19.0 - 186.0 K/uL  Comprehensive metabolic panel     Status: Abnormal   Collection Time: 04/04/15  5:33 AM  Result Value Ref Range   Sodium 134 (L) 135 - 145 mmol/L   Potassium 3.2 (L) 3.5 - 5.1 mmol/L   Chloride 104 96 - 112 mmol/L   CO2 20 19 - 32 mmol/L   Glucose, Bld 137 (H) 70 - 99 mg/dL   BUN 21 6 - 23 mg/dL   Creatinine, Ser 3.02 (H) 0.50 - 1.35 mg/dL   Calcium 8.5 8.4 - 10.5 mg/dL   Total Protein 6.7 6.0 - 8.3 g/dL   Albumin 2.2 (L) 3.5 - 5.2 g/dL   AST 165 (H) 0 - 37 U/L   ALT 105 (H) 0 - 53 U/L   Alkaline Phosphatase 381 (H) 39 - 117 U/L   Total Bilirubin 2.3 (H) 0.3 - 1.2 mg/dL   GFR calc non Af Amer 21 (L) >90 mL/min   GFR calc Af Amer 24 (L) >90 mL/min    Comment: (NOTE) The eGFR has been calculated using the CKD EPI equation. This calculation has not been validated in all clinical situations. eGFR's persistently <90 mL/min signify possible Chronic Kidney Disease.    Anion gap 10 5 - 15  Protime-INR     Status: Abnormal   Collection Time: 04/04/15  5:33 AM  Result Value Ref Range   Prothrombin Time 18.5 (H) 11.6 - 15.2 seconds   INR 1.53 (H) 0.00 - 1.49  CK     Status: None   Collection Time: 04/04/15  5:33 AM  Result Value Ref Range   Total CK 16 7 - 232 U/L  CBC with Differential/Platelet     Status: Abnormal   Collection Time: 04/04/15  5:33 AM  Result Value Ref Range   WBC 10.0 4.0 - 10.5 K/uL   RBC 2.73 (L) 4.22 - 5.81 MIL/uL   Hemoglobin 7.8 (L) 13.0 - 17.0 g/dL   HCT 25.2 (L)  39.0 - 52.0 %   MCV 92.3 78.0 - 100.0 fL   MCH 28.6 26.0 - 34.0 pg   MCHC 31.0 30.0 - 36.0 g/dL   RDW 15.3 11.5 - 15.5 %   Platelets 184 150 - 400 K/uL   Neutrophils Relative % 81 (H) 43 - 77 %   Neutro Abs 8.1 (H) 1.7 - 7.7 K/uL   Lymphocytes Relative 11 (L) 12 - 46 %   Lymphs Abs 1.1 0.7 - 4.0 K/uL   Monocytes Relative 6 3 - 12 %   Monocytes Absolute 0.6 0.1 - 1.0 K/uL   Eosinophils Relative 2 0 - 5 %   Eosinophils Absolute 0.2 0.0 - 0.7 K/uL   Basophils Relative 0 0 - 1 %   Basophils Absolute 0.0 0.0 - 0.1 K/uL  Glucose, capillary     Status: Abnormal   Collection Time: 04/04/15  7:28 AM  Result Value Ref Range   Glucose-Capillary 132 (H) 70 - 99 mg/dL  TSH     Status: None   Collection Time: 04/04/15  9:24 AM  Result Value Ref Range   TSH 2.331 0.350 - 4.500 uIU/mL  Glucose, capillary     Status: Abnormal   Collection Time: 04/04/15 11:26 AM  Result Value Ref Range   Glucose-Capillary 149 (H) 70 - 99 mg/dL  Glucose, capillary     Status: Abnormal   Collection Time: 04/04/15  4:21 PM  Result Value Ref Range   Glucose-Capillary 155 (H) 70 - 99 mg/dL   Comment 1 Notify RN   Glucose, capillary     Status: Abnormal   Collection Time: 04/04/15  9:30 PM  Result Value Ref Range   Glucose-Capillary 163 (H) 70 - 99 mg/dL  Comprehensive metabolic panel     Status: Abnormal   Collection Time: 04/05/15  4:48 AM  Result Value Ref Range   Sodium 135 135 - 145 mmol/L   Potassium 4.6 3.5 - 5.1 mmol/L    Comment: DELTA CHECK NOTED   Chloride 105 96 - 112 mmol/L   CO2 21 19 - 32 mmol/L   Glucose, Bld 128 (H) 70 - 99 mg/dL   BUN 22 6 - 23 mg/dL   Creatinine, Ser 2.84 (H) 0.50 - 1.35 mg/dL   Calcium 8.9 8.4 - 10.5 mg/dL   Total Protein 7.1 6.0 - 8.3 g/dL   Albumin 2.3 (L) 3.5 - 5.2 g/dL   AST 94 (H) 0 - 37 U/L   ALT 79 (H) 0 - 53 U/L   Alkaline Phosphatase 357 (H) 39 - 117 U/L   Total Bilirubin 1.7 (H) 0.3 - 1.2 mg/dL   GFR calc non Af Amer 23 (L) >90 mL/min   GFR calc Af Amer  26 (L) >90 mL/min    Comment: (NOTE) The eGFR has been calculated using the CKD EPI equation. This calculation has not been validated in all clinical situations. eGFR's persistently <90 mL/min signify possible Chronic Kidney Disease.    Anion gap 9 5 - 15  Glucose, capillary     Status: Abnormal   Collection Time: 04/05/15  5:58 AM  Result Value Ref Range   Glucose-Capillary 122 (H) 70 - 99 mg/dL    Nm Hepatobiliary Liver Func  04/04/2015   CLINICAL DATA:  Chronic cholecystitis  EXAM: NUCLEAR MEDICINE HEPATOBILIARY IMAGING  TECHNIQUE: Sequential images of the abdomen were obtained out to 60 minutes following intravenous administration of radiopharmaceutical.  RADIOPHARMACEUTICALS:  5.3 mCi Millicurie [id number] Choletec  COMPARISON:  None.  FINDINGS: Mild delay in clearance of blood pool. Uptake in the liver is homogeneous and adequate. The central biliary tree and bowel is seen by 10 minutes. The gallbladder was not visualized at 1 hour and 3.6 mg of morphine was given intravenously. The gallbladder subsequently filled.  IMPRESSION: 1. Patent cystic duct with delayed gallbladder filling. 2. Patent common bile duct.   Electronically Signed   By: Monte Fantasia M.D.   On: 04/04/2015 16:18   US Abdomen Complete  04/03/2015   CLINICAL DATA:  Abdominal pain, stage IV renal disease.  EXAM: ULTRASOUND ABDOMEN COMPLETE  COMPARISON:  CT 02/07/2015  FINDINGS: Gallbladder: Gallbladder is contracted and thick-walled. Several gallstones are noted measuring up to 5 mm. There some debris within the gallbladder. Negative sonographic Murphy's sign.  Common bile duct: Diameter: Upper limits of normal at 6 mm.  Liver: No focal lesion identified. Within normal limits in parenchymal echogenicity.  IVC: Within normal limits  Pancreas: Visualized portion unremarkable.  Spleen: Mildly  enlarged spleen with a calculated volume of 657 cubic cm.  Right Kidney: Length: 11.2 cm. Echogenicity within normal limits. No mass or  hydronephrosis visualized.  Left Kidney: Length: 11.4 cm. Echogenicity within normal limits. No mass or hydronephrosis visualized.  Abdominal aorta: No aneurysm visualized.  Other findings: Small right pleural effusion.  IMPRESSION: 1. Contracted gallbladder with wall thickening and gallstones suggests chronic cholecystitis. Negative sonographic Murphy's sign. 2. Common bile duct upper limits of normal. 3. Mild splenomegaly. 4. Small right pleural effusion.   Electronically Signed   By: Suzy Bouchard M.D.   On: 04/03/2015 21:09    Review of Systems  Constitutional: Negative.  Negative for fever, chills, weight loss and malaise/fatigue.  HENT: Negative.   Eyes: Negative.   Respiratory: Negative.   Cardiovascular: Negative.   Gastrointestinal: Negative.  Negative for nausea and vomiting.  Genitourinary: Positive for hematuria. Negative for flank pain.  Musculoskeletal: Negative.   Skin: Negative.   Neurological: Negative.   Endo/Heme/Allergies: Negative.   Psychiatric/Behavioral: Negative.    Blood pressure 92/54, pulse 73, temperature 97.2 F (36.2 C), temperature source Oral, resp. rate 18, height $RemoveBe'5\' 10"'[id number]$  (1.778 m), weight 89.086 kg (196 lb 6.4 oz), SpO2 98 %. Physical Exam  Constitutional: He appears well-developed.  Vigorous for comorbidity, wife at bedside  HENT:  Head: Normocephalic.  Eyes: Pupils are equal, round, and reactive to light.  Neck: Normal range of motion.  Cardiovascular: Normal rate.   Respiratory: Effort normal.  GI: Soft.  Prior midline scar w/o hernia. Rt sided colostomy patetnt  Genitourinary: Penis normal.  No CVAT  Musculoskeletal: Normal range of motion.  Neurological: He is alert.  Skin: Skin is warm.  Psychiatric: He has a normal mood and affect. His behavior is normal. Judgment and thought content normal.    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 Other DDX include new / recurrent  malignancy, but felt much less likely. He is improving on current regimen for UTI / holding anticoagulation and not had any clot retention or other emergent issues.  Favor repeat imaging this admission to help verify no new GU stones masses, and cysto in Urol office as outpatient, we will arrange.   2 - Chronic Renal Insufficiency - likely medical renal as no hydro / retention.   3 -  High Risk Prostate Cancer - excellent biochemical control by most recent serum labs.   4 - Cystitis - agree with current ABX, may benefit from extended course (10-14 days) given association with hematuria.  I feel pt is OK for DC home from GU perspective at anytime. We will arrange office followup with Dr. Junious [name]. Discussed s/s clot retention with pt and need for urgent eval should that develop and he and his wife voiced understanding.      Delayla Hoffmaster 04/05/2015, 10:47 AM

## 2015-04-06 DIAGNOSIS — K6289 Other specified diseases of anus and rectum: Secondary | ICD-10-CM

## 2015-04-06 LAB — COMPREHENSIVE METABOLIC PANEL
ALBUMIN: 2.2 g/dL — AB (ref 3.5–5.0)
ALT: 53 U/L (ref 17–63)
ANION GAP: 9 (ref 5–15)
AST: 57 U/L — ABNORMAL HIGH (ref 15–41)
Alkaline Phosphatase: 286 U/L — ABNORMAL HIGH (ref 38–126)
BUN: 22 mg/dL — AB (ref 6–20)
CO2: 21 mmol/L — ABNORMAL LOW (ref 22–32)
CREATININE: 2.75 mg/dL — AB (ref 0.61–1.24)
Calcium: 8.6 mg/dL — ABNORMAL LOW (ref 8.9–10.3)
Chloride: 104 mmol/L (ref 101–111)
GFR calc Af Amer: 27 mL/min — ABNORMAL LOW (ref >60)
GFR calc non Af Amer: 24 mL/min — ABNORMAL LOW (ref >60)
GLUCOSE: 165 mg/dL — AB (ref 70–99)
POTASSIUM: 3.6 mmol/L (ref 3.5–5.1)
Sodium: 134 mmol/L — ABNORMAL LOW (ref 135–145)
Total Bilirubin: 1.3 mg/dL — ABNORMAL HIGH (ref 0.3–1.2)
Total Protein: 6.6 g/dL (ref 6.5–8.1)

## 2015-04-06 LAB — CBC
HCT: 26.1 % — ABNORMAL LOW (ref 39.0–52.0)
Hemoglobin: 8.2 g/dL — ABNORMAL LOW (ref 13.0–17.0)
MCH: 28.7 pg (ref 26.0–34.0)
MCHC: 31.4 g/dL (ref 30.0–36.0)
MCV: 91.3 fL (ref 78.0–100.0)
Platelets: 213 10*3/uL (ref 150–400)
RBC: 2.86 MIL/uL — ABNORMAL LOW (ref 4.22–5.81)
RDW: 15.3 % (ref 11.5–15.5)
WBC: 7.6 10*3/uL (ref 4.0–10.5)

## 2015-04-06 LAB — FERRITIN: Ferritin: 80 ng/mL (ref 24–336)

## 2015-04-06 LAB — IRON AND TIBC
Iron: 49 ug/dL (ref 45–182)
Saturation Ratios: 12 % — ABNORMAL LOW (ref 17.9–39.5)
TIBC: 424 ug/dL (ref 250–450)
UIBC: 375 ug/dL

## 2015-04-06 LAB — URINE CULTURE
Colony Count: NO GROWTH
Culture: NO GROWTH

## 2015-04-06 LAB — PSA: PSA: 0.09 ng/mL (ref 0.00–4.00)

## 2015-04-06 LAB — GLUCOSE, CAPILLARY
GLUCOSE-CAPILLARY: 139 mg/dL — AB (ref 70–99)
Glucose-Capillary: 233 mg/dL — ABNORMAL HIGH (ref 70–99)

## 2015-04-06 MED ORDER — GLIPIZIDE 5 MG PO TABS: 5.0000 mg | ORAL_TABLET | Freq: Every day | ORAL | Status: DC

## 2015-04-06 MED ORDER — CIPROFLOXACIN HCL 250 MG PO TABS: 250.0000 mg | ORAL_TABLET | Freq: Two times a day (BID) | ORAL | Status: DC

## 2015-04-06 NOTE — Discharge Summary (Signed)
Name: [name] MRN: [id number] DOB: 05-18-1956 59 y.o. PCP: Redmond School, MD  Date of Admission: 04/03/2015  3:26 PM Date of Discharge: 04/06/2015 Attending Physician: Gilles Chiquito, MD Discharge Diagnosis: 1. Hematuria/UTI 2. Transaminitis 3. H/o Rectal/Prostate CA w/ New Perirectal Nodule 4. Chronic sCHF 5. CAD 6. PAF 7. DM type II   Discharge Medications:   Medication List    STOP taking these medications        acetaminophen 500 MG tablet  Commonly known as:  TYLENOL     amiodarone 200 MG tablet  Commonly known as:  PACERONE     ELIQUIS 5 MG Tabs tablet  Generic drug:  apixaban     Fish Oil 1200 MG Caps     HYDROcodone-acetaminophen 5-325 MG per tablet  Commonly known as:  NORCO/VICODIN     losartan 25 MG tablet  Commonly known as:  COZAAR     rosuvastatin 40 MG tablet  Commonly known as:  CRESTOR      TAKE these medications        aspirin EC 81 MG tablet  Take 81 mg by mouth daily.     ciprofloxacin 250 MG tablet  Commonly known as:  CIPRO  Take 1 tablet (250 mg total) by mouth 2 (two) times daily.     co-enzyme Q-10 50 MG capsule  Take 50 mg by mouth every morning.     digoxin 0.125 MG tablet  Commonly known as:  LANOXIN  Take 1 tablet by mouth every other day.     fluticasone 50 MCG/ACT nasal spray  Commonly known as:  FLONASE  Place 1 spray into both nostrils daily as needed for allergies.     furosemide 40 MG tablet  Commonly known as:  LASIX  Take 1 tablet (40 mg total) by mouth daily as needed.     glipiZIDE 5 MG tablet  Commonly known as:  GLUCOTROL  Take 1 tablet (5 mg total) by mouth daily before breakfast.     isosorbide dinitrate 30 MG tablet  Commonly known as:  ISORDIL  Take 15 mg by mouth 2 (two) times daily.     metoprolol succinate 25 MG 24 hr tablet  Commonly known as:  TOPROL-XL  Take 50mg  (2 tablets) in am and 25mg  (1 tablet) in pm.     nitroGLYCERIN 0.4 MG/SPRAY spray  Commonly known as:  NITROLINGUAL    Place 1 spray under the tongue every 5 (five) minutes as needed. angina     pantoprazole 40 MG tablet  Commonly known as:  PROTONIX  Take 1 tablet (40 mg total) by mouth daily.     ranolazine 500 MG 12 hr tablet  Commonly known as:  RANEXA  Take 1 tablet (500 mg total) by mouth 2 (two) times daily.        Disposition and follow-up:   RobertJayston C [name] was discharged from [facility] in Stable condition.  At the hospital follow up visit please address:  1.  PPM replacement; scheduled for 04/10/15  Perirectal Nodule; Seen on CT abdomen/pelvis, needs further workup w/ Dr. Benay Spice w/ regards to further imaging and management. PSA 0.09, CEA 1.4.   Transaminitis; Most likely related to passed gall stone vs amiodarone toxicity. [place] suggestive of chronic cholecystitis and CT suggestive of amiodarone toxicity. Abdominal pain resolved during admission. Needs follow up CMP, follow up iron wnl, ceruloplasmin actually mildly elevated. Do not suspect metal toxicity.   Hematuria; most likely 2/2 UTI w/  previous h/o Prostate CA. No findings of CT suggestive of bladder CA. Sent home w/ ABx. Hematuria completely resolved? Further symptoms? Follow up w/ Dr. Tresa Moore for cystoscopy.   DM Type II; Sent home on Glipizide 5 mg daily for CBG control. Scheduled to see endocrinologist, would likely benefit from Insulin therapy.   CHF/Atrial Fibrillation; Discontinued amiodarone given elevated LFT's and CT suggestive of possible amiodarone toxicity. Also held Eliquis given his hematuria. Would recheck CBC to determine increased Hb since hospitalization before considering restarting Eliquis.   2.  Labs / imaging needed at time of follow-up: CMP, CBC, PT/INR?  3.  Pending labs/ test needing follow-up: None (see above)  Follow-up Appointments: Follow-up Information    Follow up with Glo Herring., MD On 04/24/2015.   Specialty:  Internal Medicine   Why:  Thursday @ 1:45 PM with Collene Mares.  Hospital follow-up appointment   Contact information:   [address] [place] [address] [phone]       Consultations: Treatment Team:  Alexis Frock, MD  Procedures Performed:  Ct Abdomen Pelvis Wo Contrast  04/05/2015   CLINICAL DATA:  LFTs improved. No significant complaints today. Urine is still dark in color. Passed clots. History of colostomy.  EXAM: CT ABDOMEN AND PELVIS WITHOUT CONTRAST  TECHNIQUE: Multidetector CT imaging of the abdomen and pelvis was performed following the standard protocol without IV contrast.  COMPARISON:  02/07/2015  FINDINGS: Lower chest: Pacing leads and coronary stents. Small bilateral pleural effusions associated with bibasilar atelectasis. No pulmonary nodules.  Upper abdomen: The noncontrast appearance of the liver is high attenuation. Liver is normal in size. No focal lesions are identified. Gallbladder is present a contains probable small calcified gallstone. The spleen is normal in appearance. No focal abnormality identified within the pancreas, adrenal glands, or kidneys.  Gastrointestinal tract: The stomach and small bowel loops are normal in appearance. Left mid abdominal colostomy. There is soft tissue in the region of the rectum possibly representing a rectal stump and unchanged. No evidence for bowel obstruction. The appendix is well seen and has a normal appearance.  Pelvis: There is no free pelvic fluid. Seeds versus clips within the prostate gland. Urinary bladder is distended. Adjacent to the rectum in the region of the ischiorectal fossa there is a small soft tissue nodule measuring 2.3 x 2.6 x 2.7 cm. Findings are suspicious for recurrence of rectal cancer.  Retroperitoneum: No retroperitoneal or mesenteric adenopathy. No evidence for aortic aneurysm.  Abdominal wall: Left mid abdominal ostomy site is unremarkable. There is mild dependent body wall edema.  Osseous structures: Lower thoracic and lumbar spondylosis. No suspicious lytic or  blastic lesions are identified.  IMPRESSION: 1. Diffusely high attenuation liver density without focal lesion. Findings are suspicious for heavy metal deposition. Considerations include hemochromatosis, hemosiderosis, copper deposition from Hickman disease. Glycogen storage disease, amiodarone toxicity and gold therapy can also cause this appearance. 2. Left mid abdominal colostomy is unremarkable in appearance. 3. 2.7 cm nodule in the right perirectal region, suspicious for recurrent cancer.   Electronically Signed   By: Nolon Nations M.D.   On: 04/05/2015 18:01   Nm Hepatobiliary Liver Func  04/04/2015   CLINICAL DATA:  Chronic cholecystitis  EXAM: NUCLEAR MEDICINE HEPATOBILIARY IMAGING  TECHNIQUE: Sequential images of the abdomen were obtained out to 60 minutes following intravenous administration of radiopharmaceutical.  RADIOPHARMACEUTICALS:  5.3 mCi Millicurie IR-51O Choletec  COMPARISON:  None.  FINDINGS: Mild delay in clearance of blood pool. Uptake in the liver is homogeneous  and adequate. The central biliary tree and bowel is seen by 10 minutes. The gallbladder was not visualized at 1 hour and 3.6 mg of morphine was given intravenously. The gallbladder subsequently filled.  IMPRESSION: 1. Patent cystic duct with delayed gallbladder filling. 2. Patent common bile duct.   Electronically Signed   By: Monte Fantasia M.D.   On: 04/04/2015 16:18   US Abdomen Complete  04/03/2015   CLINICAL DATA:  Abdominal pain, stage IV renal disease.  EXAM: ULTRASOUND ABDOMEN COMPLETE  COMPARISON:  CT 02/07/2015  FINDINGS: Gallbladder: Gallbladder is contracted and thick-walled. Several gallstones are noted measuring up to 5 mm. There some debris within the gallbladder. Negative sonographic Murphy's sign.  Common bile duct: Diameter: Upper limits of normal at 6 mm.  Liver: No focal lesion identified. Within normal limits in parenchymal echogenicity.  IVC: Within normal limits  Pancreas: Visualized portion  unremarkable.  Spleen: Mildly enlarged spleen with a calculated volume of 657 cubic cm.  Right Kidney: Length: 11.2 cm. Echogenicity within normal limits. No mass or hydronephrosis visualized.  Left Kidney: Length: 11.4 cm. Echogenicity within normal limits. No mass or hydronephrosis visualized.  Abdominal aorta: No aneurysm visualized.  Other findings: Small right pleural effusion.  IMPRESSION: 1. Contracted gallbladder with wall thickening and gallstones suggests chronic cholecystitis. Negative sonographic Murphy's sign. 2. Common bile duct upper limits of normal. 3. Mild splenomegaly. 4. Small right pleural effusion.   Electronically Signed   By: Suzy Bouchard M.D.   On: 04/03/2015 21:09    2D Echo: 02/24/15  Study Conclusions  - Left ventricle: The cavity size was moderately dilated. Wall thickness was increased in a pattern of mild LVH. Systolic function was severely reduced. The estimated ejection fraction was in the range of 25% to 30%. Diffuse hypokinesis. There is severe hypokinesis of the mid-apicalanteroseptal and apical myocardium. Doppler parameters are consistent with restrictive physiology, indicative of decreased left ventricular diastolic compliance and/or increased left atrial pressure. - Mitral valve: There was mild regurgitation. - Left atrium: The atrium was moderately dilated. - Right atrium: The atrium was mildly dilated. - Pulmonary arteries: PA peak pressure: 39 mm Hg (S).   Admission HPI: LEO FRAY is a 59 year old man with history of systolic congestive heart failure status post pacemaker/ICD placement, coronary artery disease, paroxysmal atrial fibrillation on amiodarone and Eliquis, rectal and prostate cancer, TIA, and type 2 diabetes with CKD stage IV presenting with hematuria and abdominal pain. Patient reports 1 week of poor appetite associated with weight loss and intermittent right upper quadrant and epigastric abdominal pain. He also  reports nausea and vomiting 2 days ago that is currently resolved. He reports that the nausea and vomiting was triggered by a meal. He also says that his skin has been getting more yellow over the past couple of weeks. This morning at 11 AM he had acute onset hematuria with clots in his urine. This is associated with some dysuria. He reports having one additional episode upon arrival to the emergency room, but his urine was more clear the last time he urinated. He denies any fevers or chills. He reports previously being told that he had cholecystitis, and he has had similar abdominal pain in the past. He has not pursued surgery to this point due to his cardiac issues, but he reports planning to discuss with a general surgeon in the next couple of weeks.  In the ER, his vital signs were stable. His liver function tests were noted to be elevated, and  abdominal ultrasound demonstrated findings consistent with chronic cholecystitis. This was discussed with general surgery who recommended further workup of cholecystitis with HIDA scan and MRCP given the possibility that his liver findings were due to amiodarone toxicity. His urinalysis was concerning for urinary tract infection, so he was started on ceftriaxone IV.  Hospital Course by problem list:   1. Hematuria/UTI- Gross hematuria associated with dysuria. Patient does have WBCs and bacteria in his urine suggesting urinary tract infection in conjunction with his symptoms. He was on ceftriaxone iv which was transitioned to ciprofloxacin 250 mg bid for total of 10 day course through 04/12/15. His hematuria improved significantly. Urology had been consulted and they are appreciated. He will follow-up with Dr Tresa Moore of urology for likely cystoscopy.  2. Transaminitis- Presenting AST and ALT, respectively of 331 and 105 trended down to 57 and 53 the day of discharge. Ultrasound consistent with chronic cholecystitis but may have amiodarone hepatotoxicity, which is  typically associated with transaminitis and less commonly with cholestasis. General surgery wanted MRCP (could not due to ICD) and/or HIDA before possible surgical intervention, and cardiac history may limit surgical options. HIDA performed notable for patent cystic duct with delayed gallbladder filling and patent common bile duct.  Amiodarone was decreased to 200 mg daily during last cardiology visit, but was as high as 800 mg daily in the past and been on for several years. This was held including on discharge. Hepatitis panel negative, no alcohol use, no AMS or asterixis. CT abdomen/pelvis showed diffusely high attentuation liver density without focal lesion suspicious for hemochromatosis, hemosiderosis, copper deposition, glycogen storage disease, amiodarone toxicity, and gold therapy. Amiodarone held on discharge.  3. H/o Rectal/Prostate CA w/ New Perirectal Nodule-Stage III rectal cancer diagnosed in June 2008 status post chemotherapy and radiation followed by colostomy. Status post radiation therapy and 2 years of Lupron therapy for prostate cancer. Early remission. Last oncology in September 2015. CT abd/pelvis noted 2.7 cm nodule in the R perirectal region suspicious for recurrent cancer. CEA and PSA within normal limits. Oncology was contacted and he will follow-up with Dr Benay Spice in 1-2 weeks.  4. Chronic sCHF-Due to ischemic cardiopathy s/p ICD which shows elective replacement indicator scheduled for next week. TTE 02/24/15 EF of 25-30% with restrictive diastolic function. Myoview was done showing scar but no areas of ischemia. He takes Lasix as needed for weight gain, and he last took this twice last week. We continued his home digoxin 125 ug every other day, isosorbide dinitrate 50 mg bid, lasix 40 mg dailyprn but held his home losartan in setting of UTI.  5. CAD-Status post PCI in 2001 in 2009. -Continue home aspirin 81 mg daily. -Continue home ranolazine 500 mg twice a day. -Hold home  Crestor 40 mg daily at bedtime in the setting of liver injury. -Continue nitroglycerin spray when necessary -telemetry  6. PAF- He had a traumatic subarachnoid hemorrhage and January 2016, but was currently back on Eliquis on presentation. This was held due to hematuria. His home metoprolol 50 mg am, 25 mg pm was continued.  7. DM type II-Not currently on any medications. Hemoglobin A1c returned at 8.9. He was discharged on glipizide 5 mg daily for blood glucose controlled and is scheduled to see an endocrinologist. He would likely benefit from insulin therapy.    Discharge Vitals:   BP 113/64 mmHg  Pulse 73  Temp(Src) 97.5 F (36.4 C) (Oral)  Resp 18  Ht 5\' 10"  (1.778 m)  Wt 195 lb 8 oz (  88.678 kg)  BMI 28.05 kg/m2  SpO2 100%  Discharge Physical Exam: General: White male, alert, cooperative, NAD. HEENT: PERRL, EOMI. Moist mucus membranes. Mild icterus.  Neck: Full range of motion without pain, supple, no lymphadenopathy or carotid bruits Lungs: Clear to ascultation bilaterally, normal work of respiration, no wheezes, rales, rhonchi Heart: RRR, no murmurs, gallops, or rubs Abdomen: Soft, non-tender, non-distended, BS +. Left-sided colostomy.  Extremities: No cyanosis, clubbing, or edema Neurologic: Alert & oriented x3, cranial nerves II-XII intact, strength grossly intact, sensation intact to light touch  Discharge Labs:  Basic Metabolic Panel:  Recent Labs Lab 04/06/15 0648 04/08/15 1526  NA 134* 135  K 3.6 4.5  CL 104 105  CO2 21* 22  GLUCOSE 165* 346*  BUN 22* 22  CREATININE 2.75* 2.20*  CALCIUM 8.6* 8.4   Liver Function Tests:  Recent Labs Lab 04/05/15 0448 04/06/15 0648  AST 94* 57*  ALT 79* 53  ALKPHOS 357* 286*  BILITOT 1.7* 1.3*  PROT 7.1 6.6  ALBUMIN 2.3* 2.2*   CBC:  Recent Labs Lab 04/06/15 0648 04/08/15 1526  WBC 7.6 7.4  NEUTROABS  --  5.6  HGB 8.2* 8.3*  HCT 26.1* 27.4*  MCV 91.3 96.1  PLT 213 225   CBG:  Recent Labs Lab  04/05/15 1705 04/05/15 2112 04/06/15 0551 04/06/15 1238 04/10/15 0546 04/10/15 1023  GLUCAP 207* 187* 139* 233* 258* 214*   Hemoglobin A1C:  Recent Labs Lab 04/05/15 0655  HGBA1C 8.9*   Coagulation:  Recent Labs Lab 04/08/15 1526  LABPROT 15.1  INR 1.19   Anemia Panel:  Recent Labs Lab 04/06/15 1505  FERRITIN 80  TIBC 424  IRON 49   Urine Drug Screen: Drugs of Abuse     Component Value Date/Time   LABOPIA POSITIVE* 04/03/2015 1532   COCAINSCRNUR NONE DETECTED 04/03/2015 1532   LABBENZ NONE DETECTED 04/03/2015 1532   AMPHETMU NONE DETECTED 04/03/2015 1532   THCU NONE DETECTED 04/03/2015 1532   LABBARB NONE DETECTED 04/03/2015 1532    Urinalysis:  Recent Labs Lab 04/05/15 1800  COLORURINE AMBER*  LABSPEC 1.016  PHURINE 5.5  GLUCOSEU 500*  HGBUR LARGE*  BILIRUBINUR NEGATIVE  KETONESUR NEGATIVE  PROTEINUR 100*  UROBILINOGEN 0.2  NITRITE NEGATIVE  LEUKOCYTESUR MODERATE*   Ceruloplasmin 37 Iron 49 TIBC 424 Ferritin 80 CEA 1.4 PSA  0.09 CK 16  Signed: Kelby Aline, MD 04/11/2015, 3:46 PM    Services Ordered on Discharge: None Equipment Ordered on Discharge: None

## 2015-04-06 NOTE — Progress Notes (Addendum)
Subjective: Feeling better this AM, hematuria improving. CT abdomen/pelvis significant for 2.7 cm perirectal nodule concerning for cancer recurrence.   Objective: Vital signs in last 24 hours: Filed Vitals:   04/05/15 1955 04/06/15 0424 04/06/15 1005 04/06/15 1009  BP: 112/68 103/60  87/54  Pulse: 77 73 62 62  Temp: 97.7 F (36.5 C) 97.6 F (36.4 C)    TempSrc: Oral Oral    Resp: 18 18    Height:      Weight:  195 lb 8 oz (88.678 kg)    SpO2: 100% 98%     Weight change: -14.4 oz (-0.408 kg)  Intake/Output Summary (Last 24 hours) at 04/06/15 1104 Last data filed at 04/06/15 1010  Gross per 24 hour  Intake    603 ml  Output   1275 ml  Net   -672 ml   Physical Exam: General: White male, alert, cooperative, NAD. HEENT: PERRL, EOMI. Moist mucus membranes. Mild icterus.   Neck: Full range of motion without pain, supple, no lymphadenopathy or carotid bruits Lungs: Clear to ascultation bilaterally, normal work of respiration, no wheezes, rales, rhonchi Heart: RRR, no murmurs, gallops, or rubs Abdomen: Soft, non-tender, non-distended, BS +. Left-sided colostomy.  Extremities: No cyanosis, clubbing, or edema Neurologic: Alert & oriented x3, cranial nerves II-XII intact, strength grossly intact, sensation intact to light touch    Lab Results: Basic Metabolic Panel:  Recent Labs Lab 04/05/15 0448 04/06/15 0648  NA 135 134*  K 4.6 3.6  CL 105 104  CO2 21 21*  GLUCOSE 128* 165*  BUN 22 22*  CREATININE 2.84* 2.75*  CALCIUM 8.9 8.6*   Liver Function Tests:  Recent Labs Lab 04/05/15 0448 04/06/15 0648  AST 94* 57*  ALT 79* 53  ALKPHOS 357* 286*  BILITOT 1.7* 1.3*  PROT 7.1 6.6  ALBUMIN 2.3* 2.2*    Recent Labs Lab 04/03/15 1629  LIPASE 28   CBC:  Recent Labs Lab 04/04/15 0533 04/05/15 1110 04/06/15 0648  WBC 10.0 8.6 7.6  NEUTROABS 8.1*  --   --   HGB 7.8* 8.3* 8.2*  HCT 25.2* 26.6* 26.1*  MCV 92.3 91.7 91.3  PLT 184 193 213   Cardiac  Enzymes:  Recent Labs Lab 04/04/15 0533  CKTOTAL 16   CBG:  Recent Labs Lab 04/04/15 2130 04/05/15 0558 04/05/15 1132 04/05/15 1705 04/05/15 2112 04/06/15 0551  GLUCAP 163* 122* 234* 207* 187* 139*   Thyroid Function Tests:  Recent Labs Lab 04/04/15 0924  TSH 2.331   Coagulation:  Recent Labs Lab 04/03/15 1629 04/04/15 0533  LABPROT 20.4* 18.5*  INR 1.73* 1.53*   Anemia Panel:  Recent Labs Lab 04/03/15 2237  VITAMINB12 559  FOLATE 6.8  FERRITIN 122  TIBC 370  IRON 85  RETICCTPCT 3.9*   Urine Drug Screen: Drugs of Abuse     Component Value Date/Time   LABOPIA POSITIVE* 04/03/2015 1532   COCAINSCRNUR NONE DETECTED 04/03/2015 1532   LABBENZ NONE DETECTED 04/03/2015 1532   AMPHETMU NONE DETECTED 04/03/2015 1532   THCU NONE DETECTED 04/03/2015 1532   LABBARB NONE DETECTED 04/03/2015 1532    Alcohol Level:  Recent Labs Lab 04/03/15 2237  ETH <5   Urinalysis:  Recent Labs Lab 04/03/15 1532 04/05/15 1800  COLORURINE RED* AMBER*  LABSPEC 1.020 1.016  PHURINE 6.0 5.5  GLUCOSEU >1000* 500*  HGBUR LARGE* LARGE*  BILIRUBINUR LARGE* NEGATIVE  KETONESUR 15* NEGATIVE  PROTEINUR >300* 100*  UROBILINOGEN 1.0 0.2  NITRITE POSITIVE* NEGATIVE  LEUKOCYTESUR  MODERATE* MODERATE*   Hepatitis panel negative  Micro Results: Recent Results (from the past 240 hour(s))  Urine culture     Status: None   Collection Time: 04/03/15  3:32 PM  Result Value Ref Range Status   Specimen Description URINE, RANDOM  Final   Special Requests ADD  Final   Colony Count   Final    >=100,000 COLONIES/ML Performed at [facility]    Culture   Final    Multiple bacterial morphotypes present, none predominant. Suggest appropriate recollection if clinically indicated. Performed at [facility]    Report Status 04/05/2015 FINAL  Final   Studies/Results: Ct Abdomen Pelvis Wo Contrast  04/05/2015   CLINICAL DATA:  LFTs improved. No significant  complaints today. Urine is still dark in color. Passed clots. History of colostomy.  EXAM: CT ABDOMEN AND PELVIS WITHOUT CONTRAST  TECHNIQUE: Multidetector CT imaging of the abdomen and pelvis was performed following the standard protocol without IV contrast.  COMPARISON:  02/07/2015  FINDINGS: Lower chest: Pacing leads and coronary stents. Small bilateral pleural effusions associated with bibasilar atelectasis. No pulmonary nodules.  Upper abdomen: The noncontrast appearance of the liver is high attenuation. Liver is normal in size. No focal lesions are identified. Gallbladder is present a contains probable small calcified gallstone. The spleen is normal in appearance. No focal abnormality identified within the pancreas, adrenal glands, or kidneys.  Gastrointestinal tract: The stomach and small bowel loops are normal in appearance. Left mid abdominal colostomy. There is soft tissue in the region of the rectum possibly representing a rectal stump and unchanged. No evidence for bowel obstruction. The appendix is well seen and has a normal appearance.  Pelvis: There is no free pelvic fluid. Seeds versus clips within the prostate gland. Urinary bladder is distended. Adjacent to the rectum in the region of the ischiorectal fossa there is a small soft tissue nodule measuring 2.3 x 2.6 x 2.7 cm. Findings are suspicious for recurrence of rectal cancer.  Retroperitoneum: No retroperitoneal or mesenteric adenopathy. No evidence for aortic aneurysm.  Abdominal wall: Left mid abdominal ostomy site is unremarkable. There is mild dependent body wall edema.  Osseous structures: Lower thoracic and lumbar spondylosis. No suspicious lytic or blastic lesions are identified.  IMPRESSION: 1. Diffusely high attenuation liver density without focal lesion. Findings are suspicious for heavy metal deposition. Considerations include hemochromatosis, hemosiderosis, copper deposition from [place] disease. Glycogen storage disease, amiodarone  toxicity and gold therapy can also cause this appearance. 2. Left mid abdominal colostomy is unremarkable in appearance. 3. 2.7 cm nodule in the right perirectal region, suspicious for recurrent cancer.   Electronically Signed   By: Nolon Nations M.D.   On: 04/05/2015 18:01   Nm Hepatobiliary Liver Func  04/04/2015   CLINICAL DATA:  Chronic cholecystitis  EXAM: NUCLEAR MEDICINE HEPATOBILIARY IMAGING  TECHNIQUE: Sequential images of the abdomen were obtained out to 60 minutes following intravenous administration of radiopharmaceutical.  RADIOPHARMACEUTICALS:  5.3 mCi Millicurie KG-40N Choletec  COMPARISON:  None.  FINDINGS: Mild delay in clearance of blood pool. Uptake in the liver is homogeneous and adequate. The central biliary tree and bowel is seen by 10 minutes. The gallbladder was not visualized at 1 hour and 3.6 mg of morphine was given intravenously. The gallbladder subsequently filled.  IMPRESSION: 1. Patent cystic duct with delayed gallbladder filling. 2. Patent common bile duct.   Electronically Signed   By: Monte Fantasia M.D.   On: 04/04/2015 16:18   Medications: I have  reviewed the patient's current medications. Scheduled Meds: . aspirin EC  81 mg Oral Daily  . cefTRIAXone (ROCEPHIN)  IV  1 g Intravenous Q24H  . digoxin  125 mcg Oral QODAY  . insulin aspart  0-9 Units Subcutaneous TID WC  . isosorbide dinitrate  15 mg Oral BID  . metoprolol succinate  25 mg Oral QHS  . metoprolol succinate  50 mg Oral Daily  . ranolazine  500 mg Oral BID  . sodium chloride  3 mL Intravenous Q12H   Continuous Infusions:  PRN Meds:.fluticasone, nitroGLYCERIN   Assessment/Plan: 59 y/o M w/ PMHx of HTN, HLD, CAD s/p BMS to LAD in 2001, chronic sCHF (EF 25-30%) s/p AICD/PPM placement, PAF previously on Eliquis, GERD, CKD stage IV, DM type II, and h/o rectal adenocarcinoma s/p colostomy in 2008, admitted for gross hematuria and transaminitis.   Transaminitis: LFT's continue to improve, still  consistent w/ passed gallstone. CT abdomen/pelvis also rased concern for heavy metal toxicity vs amiodarone toxicity.  -Continue to hold Amiodarone for now, discussed w/ cardiology, will follow up as an outpatient.  -Send for Copper, iron levels -Follow up CMP as an outpatient.   Hematuria: Most likely UTI in the setting of Eliquis use. Improving significantly. Repeat urine culture pending.  -Urology consulted, appreciate recommendations -Change Abx to Cipro 250 mg bid for total of 10 day course (end date 04/12/15). Will follow culture. -Hold home Eliquis; can restart w/ cardiology as an outpatient.   Perirectal Nodule seen on CT w/ h/o Rectal and Prostate CA: Stage III Rectal CA diagnosed in June 2008 s/p chemo/radiation w/ colostomy. S/p radiation therapy and 2 years of Lupron therapy for Prostate CA. Last oncology appointment in September 2015. CT abdomen/pelvis performed showed 2.7 cm perirectal nodule concerning for recurrence of cancer. Discussed w/ Dr. Marin Olp, will schedule outpatient appointment w/ Dr. Benay Spice.  -Repeat CEA, PSA per oncology -Follow-up as an outpatient in 1-2 weeks w/ Dr. Benay Spice  Chronic Systolic CHF: ICM s/p AICD/BiV PPM. For elective replacement next week. TTE 02/24/15 EF of 25-30% with restrictive diastolic function. Myoview was done showing scar but no areas of ischemia. Takes Lasix prn for weight gain. -Continue home digoxin 125 g every other day. -Continue isosorbide dinitrate 50 mg twice a day. Hold for low BP -Hold Losartan for now given soft BP and labile Cr. Can restart on outpatient follow up.   CAD s/p BMS to LAD in 2001: Currently stable as above.  -ASA 81 -Continue Ranexa 500 mg bid -Hold statin given acute liver injury -NTG prn  PAF: Previously on Eliquis, on hold given hematuria. Had a traumatic subarachnoid hemorrhage and January 2016, but he is currently back on Eliquis. This patients CHA2DS2-VASc Score and unadjusted Ischemic Stroke Rate (% per  year) is equal to 9.7 % stroke rate/year from a score of 6. Currently A/V paced.  -Hold Eliquis given hematuria as above. Discussed w/ cardiology -Hold amiodarone as above -Continue Toprol-XL 50 mg in AM and 25 mg in PM -Continue Digoxin 125 mg every other day  DM Type II: HbA1c 10.6. Not currently on any medications. -ISS-S w/ CBG's AC/HS -Will start Glipizide 5 mg QAC on discharge. Patient scheduled to follow up w/ endocrinologist as an outpatient.   CKD Stage IV: Baseline Cr 2.5 -3.0. Cr 2.75 this AM.  -Repeat CMP as an outpatient  GERD: Stable -PPI  DVT/PE PPx: SCD's  Dispo: Disposition is deferred at this time, awaiting improvement of current medical problems.  Anticipated discharge today.  The patient does have a current PCP Redmond School, MD) and does need an [facility] hospital follow-up appointment after discharge.  The patient does not know have transportation limitations that hinder transportation to clinic appointments.  .Services Needed at time of discharge: Y = Yes, Blank = No PT:   OT:   RN:   Equipment:   Other:     LOS: 3 days   Corky Sox, MD 04/06/2015, 11:04 AM

## 2015-04-06 NOTE — Progress Notes (Signed)
Subjective:  1 - Gross Hematuria - new gross hematuria 03/2015 with clots prompting medical admission. He has h/o advanced colorectal cancer (node positive) as well as prostate cancer s/p radiation. UCX this admission with bacterial cystitis. CT non-contrast 03/2015 this admissionw/o hydro / stones but new peri-rectal mass worrisome for possible colon cancer recurrence. He is on anticoagulation at [facility] for A-Fib, but this has been held for several days.   2 - Chronic Renal Insufficiency - lon gh/o insuficiency with Cr 2-4 range x several years. Recent imaging w/o stones / hydro. Denies urinary retention.   3 - High Risk Prostate Cancer - s/p external beam radiation 2008 for Gleason 8 adenocarcinoma. Most recent PSA 0.1 and stable within last year.  4 - Cystitis - UCX this admission with multipel species, now on empiric rocephin.  Today " Alver[place] " is continues to improve from GU perspective. Urine now w/o gross blood. Unfortunately, imaging suggestive of possible local colon recurrence.   Objective: Vital signs in last 24 hours: Temp:  [97.6 F (36.4 C)-97.7 F (36.5 C)] 97.6 F (36.4 C) (05/01 0424) Pulse Rate:  [62-77] 62 (05/01 1009) Resp:  [18] 18 (05/01 0424) BP: (87-112)/(54-68) 87/54 mmHg (05/01 1009) SpO2:  [98 %-100 %] 98 % (05/01 0424) Weight:  [88.678 kg (195 lb 8 oz)] 88.678 kg (195 lb 8 oz) (05/01 0424) Last BM Date: 04/04/15  Intake/Output from previous day: 04/30 0701 - 05/01 0700 In: 1080 [P.O.:1080] Out: 7494 [Urine:1275] Intake/Output this shift: Total I/O In: 3 [I.V.:3] Out: -   General appearance: alert, cooperative, appears stated age and family and primary team at bedside Head: Normocephalic, without obvious abnormality, atraumatic Nose: Nares normal. Septum midline. Mucosa normal. No drainage or sinus tenderness. Throat: lips, mucosa, and tongue normal; teeth and gums normal Neck: supple, symmetrical, trachea midline Back: symmetric, no curvature.  ROM normal. No CVA tenderness. Resp: non-labored on room air Cardio: Nl rate GI: soft, non-tender; bowel sounds normal; no masses,  no organomegaly Extremities: extremities normal, atraumatic, no cyanosis or edema Pulses: 2+ and symmetric Lymph nodes: Cervical, supraclavicular, and axillary nodes normal. Neurologic: Grossly normal Urine in bedside urinal now dark yellow, no gross blood / clots / debris.  Lab Results:   Recent Labs  04/05/15 1110 04/06/15 0648  WBC 8.6 7.6  HGB 8.3* 8.2*  HCT 26.6* 26.1*  PLT 193 213   BMET  Recent Labs  04/05/15 0448 04/06/15 0648  NA 135 134*  K 4.6 3.6  CL 105 104  CO2 21 21*  GLUCOSE 128* 165*  BUN 22 22*  CREATININE 2.84* 2.75*  CALCIUM 8.9 8.6*   PT/INR  Recent Labs  04/03/15 1629 04/04/15 0533  LABPROT 20.4* 18.5*  INR 1.73* 1.53*   ABG No results for input(s): PHART, HCO3 in the last 72 hours.  Invalid input(s): PCO2, PO2  Studies/Results: Ct Abdomen Pelvis Wo Contrast  04/05/2015   CLINICAL DATA:  LFTs improved. No significant complaints today. Urine is still dark in color. Passed clots. History of colostomy.  EXAM: CT ABDOMEN AND PELVIS WITHOUT CONTRAST  TECHNIQUE: Multidetector CT imaging of the abdomen and pelvis was performed following the standard protocol without IV contrast.  COMPARISON:  02/07/2015  FINDINGS: Lower chest: Pacing leads and coronary stents. Small bilateral pleural effusions associated with bibasilar atelectasis. No pulmonary nodules.  Upper abdomen: The noncontrast appearance of the liver is high attenuation. Liver is normal in size. No focal lesions are identified. Gallbladder is present a contains probable small calcified gallstone.  The spleen is normal in appearance. No focal abnormality identified within the pancreas, adrenal glands, or kidneys.  Gastrointestinal tract: The stomach and small bowel loops are normal in appearance. Left mid abdominal colostomy. There is soft tissue in the region of  the rectum possibly representing a rectal stump and unchanged. No evidence for bowel obstruction. The appendix is well seen and has a normal appearance.  Pelvis: There is no free pelvic fluid. Seeds versus clips within the prostate gland. Urinary bladder is distended. Adjacent to the rectum in the region of the ischiorectal fossa there is a small soft tissue nodule measuring 2.3 x 2.6 x 2.7 cm. Findings are suspicious for recurrence of rectal cancer.  Retroperitoneum: No retroperitoneal or mesenteric adenopathy. No evidence for aortic aneurysm.  Abdominal wall: Left mid abdominal ostomy site is unremarkable. There is mild dependent body wall edema.  Osseous structures: Lower thoracic and lumbar spondylosis. No suspicious lytic or blastic lesions are identified.  IMPRESSION: 1. Diffusely high attenuation liver density without focal lesion. Findings are suspicious for heavy metal deposition. Considerations include hemochromatosis, hemosiderosis, copper deposition from [place] disease. Glycogen storage disease, amiodarone toxicity and gold therapy can also cause this appearance. 2. Left mid abdominal colostomy is unremarkable in appearance. 3. 2.7 cm nodule in the right perirectal region, suspicious for recurrent cancer.   Electronically Signed   By: Nolon Nations M.D.   On: 04/05/2015 18:01   Nm Hepatobiliary Liver Func  04/04/2015   CLINICAL DATA:  Chronic cholecystitis  EXAM: NUCLEAR MEDICINE HEPATOBILIARY IMAGING  TECHNIQUE: Sequential images of the abdomen were obtained out to 60 minutes following intravenous administration of radiopharmaceutical.  RADIOPHARMACEUTICALS:  5.3 mCi Millicurie ZO-[id number] Choletec  COMPARISON:  None.  FINDINGS: Mild delay in clearance of blood pool. Uptake in the liver is homogeneous and adequate. The central biliary tree and bowel is seen by 10 minutes. The gallbladder was not visualized at 1 hour and 3.6 mg of morphine was given intravenously. The gallbladder subsequently filled.   IMPRESSION: 1. Patent cystic duct with delayed gallbladder filling. 2. Patent common bile duct.   Electronically Signed   By: Monte Fantasia M.D.   On: 04/04/2015 16:18    Anti-infectives: Anti-infectives    Start     Dose/Rate Route Frequency Ordered Stop   04/04/15 1700  cefTRIAXone (ROCEPHIN) 1 g in dextrose 5 % 50 mL IVPB - Premix  Status:  Discontinued     1 g 100 mL/hr over 30 Minutes Intravenous  Once 04/04/15 0203 04/04/15 0846   04/04/15 1700  cefTRIAXone (ROCEPHIN) 1 g in dextrose 5 % 50 mL IVPB - Premix     1 g 100 mL/hr over 30 Minutes Intravenous Every 24 hours 04/04/15 0846     04/03/15 1645  cefTRIAXone (ROCEPHIN) 1 g in dextrose 5 % 50 mL IVPB     1 g 100 mL/hr over 30 Minutes Intravenous  Once 04/03/15 1636 04/03/15 1735      Assessment/Plan:  1 - Gross Hematuria - 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 Other DDX include new / recurrent malignancy, but felt much less likely. He is improving on current regimen for UTI / holding anticoagulation and not had any clot retention or other emergent issues.  We will arrange office cysto in few weeks with Dr. Junious Silk to complete eval.   2 - Chronic Renal Insufficiency - likely medical renal as no hydro / retention.   3 - High Risk Prostate Cancer - excellent biochemical control by most  recent serum labs.   4 - Cystitis - agree with current ABX, may benefit from extended course (10-14 days) given association with hematuria.  I feel pt is OK for DC home from GU perspective at anytime. We will arrange office followup with Dr. Junious Silk. Will follow prn now while in house, call with questions.   [facility], Rhona Fusilier 04/06/2015

## 2015-04-06 NOTE — Progress Notes (Signed)
  Date: 04/06/2015  Patient name: [name]  Medical record number: [id number]  Date of birth: July 20, 1956   This patient's plan of care was discussed with the house staff. Please see Dr. Ronnald Ramp' note for complete details. I concur with his findings.  [name] will likely be discharged today.  Transaminitis is improving.  UTI is improving.  Oncology will be contacted about new rectal mass.  Change to PO antibiotics today.    Sid Falcon, MD 04/06/2015, 12:21 PM

## 2015-04-06 NOTE — Progress Notes (Signed)
Patient was hypotensive at 10:00.  His blood pressure was 87/54.  Held isosorbide dinitrate, ranolazine and metoprolol succinate.  Spoke to nurse caring for patient, she called the doctor and is waiting on a call back. Darla Lesches S.N. [facility]

## 2015-04-06 NOTE — Discharge Instructions (Signed)
1. Please make the following follow up appointments:  Cardiology appointment regarding pacemaker revision, Amiodarone, and Eliquis discontinuation.  -Next 1-2 weeks.   Oncology follow up w/ Cr. Sherrill regarding CT findings suggestive of perirectal nodule. -Next 1-2 weeks.   Primary care doctor; please see regarding other chronic medical issues.  -Next 1-2 weeks.  Endocrinology regarding diabetes.    2. Please take all medications as previously prescribed with the following changes:  HOLD Eliquis, Losartan, Crestor, and Amiodarone until you follow up w/ cardiology.  Start taking Glipizide 5 mg every morning for diabetes. Prescription sent to pharmacy.    3. If you have worsening of your symptoms or new symptoms arise, please call the clinic ([phone]), or go to the ER immediately if symptoms are severe.

## 2015-04-06 NOTE — Progress Notes (Signed)
OT Cancellation Note  Patient Details Name: [name] MRN: [id number] DOB: 04-20-1956   Cancelled Treatment:    Reason Eval/Treat Not Completed: OT screened, no needs identified, will sign off.  [name] 04/06/2015, 11:47 AM

## 2015-04-06 NOTE — Progress Notes (Signed)
Internal med MD  Called  B/P 87/54 B/P MEDS  Held . Waiting for orders. Will continue to momitor patient.

## 2015-04-07 ENCOUNTER — Telehealth: Payer: Self-pay | Admitting: Oncology

## 2015-04-07 ENCOUNTER — Telehealth: Payer: Self-pay | Admitting: Cardiology

## 2015-04-07 LAB — HEMOGLOBIN A1C
Hgb A1c MFr Bld: 8.9 % — ABNORMAL HIGH (ref 4.8–5.6)
MEAN PLASMA GLUCOSE: 209 mg/dL

## 2015-04-07 LAB — CERULOPLASMIN: Ceruloplasmin: 37.3 mg/dL — ABNORMAL HIGH (ref 16.0–31.0)

## 2015-04-07 LAB — CEA: CEA: 1.4 ng/mL (ref 0.0–4.7)

## 2015-04-07 NOTE — Telephone Encounter (Signed)
Has question about upcoming procedure this Thursday

## 2015-04-07 NOTE — Telephone Encounter (Signed)
Pt's wife called states per hospital discharge notes pt is to f/u with Dr. Benay Spice in 2 wks and labs, sent msg to MD to put orders in for labs for 05/16 with MD visit. Mailed schedule to pt and also to confirm on my chart.... KJ

## 2015-04-07 NOTE — Telephone Encounter (Signed)
Pt admitted last week for UTI,wife states he was taken off Amiodarone and Eliquis 4 days ago and has not restarted. Patient wants to know can he still have his generator change out this week and should he go back on Amiodarone and Eliquis

## 2015-04-08 ENCOUNTER — Telehealth: Payer: Self-pay | Admitting: Internal Medicine

## 2015-04-08 DIAGNOSIS — I2510 Atherosclerotic heart disease of native coronary artery without angina pectoris: Secondary | ICD-10-CM | POA: Diagnosis not present

## 2015-04-08 DIAGNOSIS — I4891 Unspecified atrial fibrillation: Secondary | ICD-10-CM | POA: Diagnosis not present

## 2015-04-08 DIAGNOSIS — E1165 Type 2 diabetes mellitus with hyperglycemia: Secondary | ICD-10-CM | POA: Diagnosis not present

## 2015-04-08 DIAGNOSIS — I5023 Acute on chronic systolic (congestive) heart failure: Secondary | ICD-10-CM | POA: Diagnosis not present

## 2015-04-08 DIAGNOSIS — Z01818 Encounter for other preprocedural examination: Secondary | ICD-10-CM | POA: Diagnosis not present

## 2015-04-08 DIAGNOSIS — I480 Paroxysmal atrial fibrillation: Secondary | ICD-10-CM | POA: Diagnosis not present

## 2015-04-08 DIAGNOSIS — N184 Chronic kidney disease, stage 4 (severe): Secondary | ICD-10-CM | POA: Diagnosis not present

## 2015-04-08 DIAGNOSIS — E1122 Type 2 diabetes mellitus with diabetic chronic kidney disease: Secondary | ICD-10-CM | POA: Diagnosis not present

## 2015-04-08 NOTE — Telephone Encounter (Signed)
Patient's wife wanting to make sure patient is still to have device change out on Thursday since patient was just in hospital. / tg

## 2015-04-08 NOTE — Telephone Encounter (Signed)
Patient and wife aware that device change out has not been changed.

## 2015-04-09 LAB — CBC WITH DIFFERENTIAL/PLATELET
BASOS ABS: 0 10*3/uL (ref 0.0–0.1)
Basophils Relative: 0 % (ref 0–1)
Eosinophils Absolute: 0.2 10*3/uL (ref 0.0–0.7)
Eosinophils Relative: 3 % (ref 0–5)
HEMATOCRIT: 27.4 % — AB (ref 39.0–52.0)
Hemoglobin: 8.3 g/dL — ABNORMAL LOW (ref 13.0–17.0)
Lymphocytes Relative: 15 % (ref 12–46)
Lymphs Abs: 1.1 10*3/uL (ref 0.7–4.0)
MCH: 29.1 pg (ref 26.0–34.0)
MCHC: 30.3 g/dL (ref 30.0–36.0)
MCV: 96.1 fL (ref 78.0–100.0)
MONO ABS: 0.5 10*3/uL (ref 0.1–1.0)
MONOS PCT: 7 % (ref 3–12)
MPV: 10 fL (ref 8.6–12.4)
NEUTROS PCT: 75 % (ref 43–77)
Neutro Abs: 5.6 10*3/uL (ref 1.7–7.7)
Platelets: 225 10*3/uL (ref 150–400)
RBC: 2.85 MIL/uL — ABNORMAL LOW (ref 4.22–5.81)
RDW: 15.8 % — AB (ref 11.5–15.5)
WBC: 7.4 10*3/uL (ref 4.0–10.5)

## 2015-04-09 LAB — BASIC METABOLIC PANEL
BUN: 22 mg/dL (ref 6–23)
CO2: 22 meq/L (ref 19–32)
CREATININE: 2.2 mg/dL — AB (ref 0.50–1.35)
Calcium: 8.4 mg/dL (ref 8.4–10.5)
Chloride: 105 mEq/L (ref 96–112)
Glucose, Bld: 346 mg/dL — ABNORMAL HIGH (ref 70–99)
Potassium: 4.5 mEq/L (ref 3.5–5.3)
Sodium: 135 mEq/L (ref 135–145)

## 2015-04-09 LAB — APTT: aPTT: 34 seconds (ref 24–37)

## 2015-04-09 LAB — PROTIME-INR
INR: 1.19 (ref <1.50)
Prothrombin Time: 15.1 seconds (ref 11.6–15.2)

## 2015-04-10 ENCOUNTER — Ambulatory Visit (HOSPITAL_COMMUNITY): Admit: 2015-04-10 | Payer: Medicare Other | Admitting: Internal Medicine

## 2015-04-10 ENCOUNTER — Ambulatory Visit (HOSPITAL_COMMUNITY)
Admission: RE | Admit: 2015-04-10 | Discharge: 2015-04-10 | Disposition: A | Discharge status: Home / Self Care | Payer: Medicare Other | Source: Ambulatory Visit | Attending: Internal Medicine | Admitting: Internal Medicine

## 2015-04-10 ENCOUNTER — Encounter (HOSPITAL_COMMUNITY): Payer: Self-pay | Admitting: Internal Medicine

## 2015-04-10 ENCOUNTER — Encounter (HOSPITAL_COMMUNITY): Payer: Self-pay

## 2015-04-10 ENCOUNTER — Encounter (HOSPITAL_COMMUNITY)
Admission: RE | Disposition: A | Discharge status: Home / Self Care | Payer: Medicare Other | Source: Ambulatory Visit | Attending: Internal Medicine

## 2015-04-10 DIAGNOSIS — E669 Obesity, unspecified: Secondary | ICD-10-CM | POA: Insufficient documentation

## 2015-04-10 DIAGNOSIS — I129 Hypertensive chronic kidney disease with stage 1 through stage 4 chronic kidney disease, or unspecified chronic kidney disease: Secondary | ICD-10-CM | POA: Diagnosis not present

## 2015-04-10 DIAGNOSIS — Z923 Personal history of irradiation: Secondary | ICD-10-CM | POA: Diagnosis not present

## 2015-04-10 DIAGNOSIS — Z8673 Personal history of transient ischemic attack (TIA), and cerebral infarction without residual deficits: Secondary | ICD-10-CM | POA: Insufficient documentation

## 2015-04-10 DIAGNOSIS — Z6830 Body mass index (BMI) 30.0-30.9, adult: Secondary | ICD-10-CM | POA: Insufficient documentation

## 2015-04-10 DIAGNOSIS — Z955 Presence of coronary angioplasty implant and graft: Secondary | ICD-10-CM | POA: Insufficient documentation

## 2015-04-10 DIAGNOSIS — E119 Type 2 diabetes mellitus without complications: Secondary | ICD-10-CM | POA: Insufficient documentation

## 2015-04-10 DIAGNOSIS — N184 Chronic kidney disease, stage 4 (severe): Secondary | ICD-10-CM | POA: Diagnosis not present

## 2015-04-10 DIAGNOSIS — I5022 Chronic systolic (congestive) heart failure: Secondary | ICD-10-CM | POA: Diagnosis not present

## 2015-04-10 DIAGNOSIS — Z8546 Personal history of malignant neoplasm of prostate: Secondary | ICD-10-CM | POA: Insufficient documentation

## 2015-04-10 DIAGNOSIS — I480 Paroxysmal atrial fibrillation: Secondary | ICD-10-CM | POA: Insufficient documentation

## 2015-04-10 DIAGNOSIS — I255 Ischemic cardiomyopathy: Secondary | ICD-10-CM | POA: Insufficient documentation

## 2015-04-10 DIAGNOSIS — Z7982 Long term (current) use of aspirin: Secondary | ICD-10-CM | POA: Insufficient documentation

## 2015-04-10 DIAGNOSIS — G4733 Obstructive sleep apnea (adult) (pediatric): Secondary | ICD-10-CM | POA: Insufficient documentation

## 2015-04-10 DIAGNOSIS — E785 Hyperlipidemia, unspecified: Secondary | ICD-10-CM | POA: Insufficient documentation

## 2015-04-10 DIAGNOSIS — Z95810 Presence of automatic (implantable) cardiac defibrillator: Secondary | ICD-10-CM | POA: Diagnosis not present

## 2015-04-10 DIAGNOSIS — Z85048 Personal history of other malignant neoplasm of rectum, rectosigmoid junction, and anus: Secondary | ICD-10-CM | POA: Diagnosis not present

## 2015-04-10 DIAGNOSIS — I2510 Atherosclerotic heart disease of native coronary artery without angina pectoris: Secondary | ICD-10-CM | POA: Insufficient documentation

## 2015-04-10 DIAGNOSIS — I472 Ventricular tachycardia: Secondary | ICD-10-CM

## 2015-04-10 DIAGNOSIS — Z9114 Patient's other noncompliance with medication regimen: Secondary | ICD-10-CM | POA: Diagnosis not present

## 2015-04-10 HISTORY — PX: EP IMPLANTABLE DEVICE: SHX172B

## 2015-04-10 LAB — SURGICAL PCR SCREEN
MRSA, PCR: NEGATIVE
Staphylococcus aureus: NEGATIVE

## 2015-04-10 LAB — GLUCOSE, CAPILLARY
GLUCOSE-CAPILLARY: 214 mg/dL — AB (ref 70–99)
Glucose-Capillary: 258 mg/dL — ABNORMAL HIGH (ref 70–99)

## 2015-04-10 SURGERY — PERMANENT PACEMAKER GENERATOR CHANGE
Anesthesia: LOCAL

## 2015-04-10 SURGERY — PPM/BIV PPM GENERATOR CHANGEOUT

## 2015-04-10 MED ORDER — ACETAMINOPHEN 325 MG PO TABS
325.0000 mg | ORAL_TABLET | ORAL | Status: DC | PRN
Start: 2015-04-10 — End: 2015-04-10

## 2015-04-10 MED ORDER — LIDOCAINE HCL (PF) 1 % IJ SOLN
INTRAMUSCULAR | Status: AC
  Filled 2015-04-10: qty 60

## 2015-04-10 MED ORDER — LIDOCAINE HCL (PF) 1 % IJ SOLN
INTRAMUSCULAR | Status: AC
  Filled 2015-04-10: qty 30

## 2015-04-10 MED ORDER — MIDAZOLAM HCL 5 MG/5ML IJ SOLN
INTRAMUSCULAR | Status: DC | PRN
  Administered 2015-04-10: 1 mg via INTRAVENOUS
  Administered 2015-04-10 (×2): 2 mg via INTRAVENOUS
  Administered 2015-04-10: 1 mg via INTRAVENOUS
  Administered 2015-04-10: 2 mg via INTRAVENOUS

## 2015-04-10 MED ORDER — SODIUM CHLORIDE 0.9 % IV SOLN
INTRAVENOUS | Status: DC
  Administered 2015-04-10: 07:00:00 via INTRAVENOUS

## 2015-04-10 MED ORDER — MUPIROCIN 2 % EX OINT
TOPICAL_OINTMENT | CUTANEOUS | Status: AC
  Administered 2015-04-10: 07:00:00
  Filled 2015-04-10: qty 22

## 2015-04-10 MED ORDER — ONDANSETRON HCL 4 MG/2ML IJ SOLN
4.0000 mg | Freq: Four times a day (QID) | INTRAMUSCULAR | Status: DC | PRN
Start: 2015-04-10 — End: 2015-04-10

## 2015-04-10 MED ORDER — MUPIROCIN 2 % EX OINT: 1.0000 "application " | TOPICAL_OINTMENT | Freq: Once | CUTANEOUS | Status: DC

## 2015-04-10 MED ORDER — MIDAZOLAM HCL 5 MG/5ML IJ SOLN
INTRAMUSCULAR | Status: AC
  Filled 2015-04-10: qty 5

## 2015-04-10 MED ORDER — FENTANYL CITRATE (PF) 100 MCG/2ML IJ SOLN
INTRAMUSCULAR | Status: DC | PRN
  Administered 2015-04-10 (×3): 25 ug via INTRAVENOUS

## 2015-04-10 MED ORDER — CEFAZOLIN SODIUM-DEXTROSE 2-3 GM-% IV SOLR
INTRAVENOUS | Status: AC
  Filled 2015-04-10: qty 50

## 2015-04-10 MED ORDER — FENTANYL CITRATE (PF) 100 MCG/2ML IJ SOLN
INTRAMUSCULAR | Status: AC
  Filled 2015-04-10: qty 2

## 2015-04-10 MED ORDER — SODIUM CHLORIDE 0.9 % IR SOLN
80.0000 mg | Status: DC
  Filled 2015-04-10: qty 2

## 2015-04-10 MED ORDER — CEFAZOLIN SODIUM-DEXTROSE 2-3 GM-% IV SOLR
2.0000 g | INTRAVENOUS | Status: AC
  Administered 2015-04-10: 2 g via INTRAVENOUS

## 2015-04-10 MED ORDER — CHLORHEXIDINE GLUCONATE 4 % EX LIQD: 60.0000 mL | Freq: Once | CUTANEOUS | Status: DC

## 2015-04-10 SURGICAL SUPPLY — 4 items
CABLE SURGICAL S-101-97-12 (CABLE) ×3 IMPLANT
ICD UNIFY ASURA CRT CD3357-40C (ICD Generator) ×2 IMPLANT
PAD DEFIB LIFELINK (PAD) ×3 IMPLANT
TRAY PACEMAKER INSERTION (CUSTOM PROCEDURE TRAY) ×3 IMPLANT

## 2015-04-10 NOTE — H&P (Signed)
  ICD Criteria  Current LVEF:25% ;Obtained > or = 1 month ago and < or = 3 months ago.  NYHA Functional Classification: Class II  Heart Failure History:  Yes, Duration of heart failure since onset is > 9 months  Non-Ischemic Dilated Cardiomyopathy History:  No.  Atrial Fibrillation/Atrial Flutter:  Yes, A-Fib/A-Flutter type: Paroxysmal.  Ventricular Tachycardia History:  Yes, Hemodynamic instability present, VT Type:  SVT - Monomorphic.  Cardiac Arrest History:  No  History of Syndromes with Risk of Sudden Death:  No.  Previous ICD:  Yes, ICD Type:  CRT-D, Reason for ICD:  Secondary, reason for secondary prevention:  Ventricular Tachycardia  Electrophysiology Study: No.  Prior MI: Yes, Most recent MI timeframe is > 40 days.  PPM: No.  OSA:  No  Patient Life Expectancy of >=1 year: Yes.  Anticoagulation Therapy:  Patient is on anticoagulation therapy, anticoagulation was held prior to procedure.   Beta Blocker Therapy:  Yes.   Ace Inhibitor/ARB Therapy:  No, Reason not on Ace Inhibitor/ARB therapy:  chronic renal insufficiency

## 2015-04-10 NOTE — Interval H&P Note (Signed)
History and Physical Interval Note:  04/10/2015 7:17 AM  [name]  has presented today for surgery, with the diagnosis of eol  The various methods of treatment have been discussed with the patient and family. After consideration of risks, benefits and other options for treatment, the patient has consented to  Procedure(s): Ppm/Biv Biochemist, clinical (N/A) as a surgical intervention .  The patient's history has been reviewed, patient examined, no change in status, stable for surgery.  I have reviewed the patient's chart and labs.  Questions were answered to the patient's satisfaction.     Mikle Bosworth.D.

## 2015-04-10 NOTE — H&P (View-Only) (Signed)
HPI [name] [name] returns today for ongoing ICD evaluation and management. He is a pleasant 59 yo man, s/p MI, s/p ICD implant in 2002 and a BiV ICD upgrade in 2009. In the interim, he has  He has been anti-coagulated with coumadin but has had some non-compliance. He has been on fairly high dose amiodarone. He does not feel palpitations and his CHF is class 2B-3A. He was hospitalized approx. 4 weeks ago with acute on chronic systolic heart failure and sustained an appropriate ICD shock for VT. Since then he has improved.   No Known Allergies   Current Outpatient Prescriptions  Medication Sig Dispense Refill  . acetaminophen (TYLENOL) 500 MG tablet Take 1,000 mg by mouth every 6 (six) hours as needed for moderate pain.    Marland Kitchen amiodarone (PACERONE) 200 MG tablet Take 1 tablet (200 mg total) by mouth daily. 30 tablet 6  . apixaban (ELIQUIS) 5 MG TABS tablet Take 5 mg by mouth 2 (two) times daily.    Marland Kitchen aspirin EC 81 MG tablet Take 81 mg by mouth daily.    Marland Kitchen co-enzyme Q-10 50 MG capsule Take 50 mg by mouth every morning.     . digoxin (LANOXIN) 0.125 MG tablet Take 1 tablet by mouth every other day.  0  . enalapril (VASOTEC) 10 MG tablet Take 10 mg by mouth daily.     . fluticasone (FLONASE) 50 MCG/ACT nasal spray Place 1 spray into both nostrils daily as needed for allergies.     . furosemide (LASIX) 40 MG tablet Take 1 tablet (40 mg total) by mouth daily as needed. 30 tablet 0  . HYDROcodone-acetaminophen (NORCO/VICODIN) 5-325 MG per tablet Take 1-2 tablets by mouth every 6 (six) hours as needed. 30 tablet 0  . isosorbide dinitrate (ISORDIL) 30 MG tablet Take 15 mg by mouth 2 (two) times daily.     . metoprolol succinate (TOPROL-XL) 25 MG 24 hr tablet Take 50mg  (2 tablets) in am and 25mg  (1 tablet) in pm. 90 tablet 3  . nitroGLYCERIN (NITROLINGUAL) 0.4 MG/SPRAY spray Place 1 spray under the tongue every 5 (five) minutes as needed. angina 12 g 3  . Omega-3 Fatty Acids (FISH OIL) 1200 MG CAPS  Take 1,200 mg by mouth 2 (two) times daily.      . pantoprazole (PROTONIX) 40 MG tablet Take 1 tablet (40 mg total) by mouth daily. 30 tablet 6  . ranolazine (RANEXA) 500 MG 12 hr tablet Take 1 tablet (500 mg total) by mouth 2 (two) times daily. 60 tablet 6  . rosuvastatin (CRESTOR) 40 MG tablet Take 1 tablet (40 mg total) by mouth at bedtime. 90 tablet 3   No current facility-administered medications for this visit.     Past Medical History  Diagnosis Date  . Essential hypertension, benign   . CAD (coronary artery disease)     a. BMS to LAD 2001 at [facility] b. PTCA/atherectomy ramus and BMS to LAD 2009  . Chronic systolic heart failure   . GERD (gastroesophageal reflux disease)   . Paroxysmal atrial fibrillation     a. on amiodarone, digoxin and Eliquis  . Adenocarcinoma of rectum     a. 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 Prostate cancer     a. s/p seed implants with chemo and radiation  . Biventricular ICD (implantable cardioverter-defibrillator) in place 2009    upgrade  . TIA (transient ischemic attack)   . OSA on CPAP   . HLD (hyperlipidemia)   . Orthostatic  hypotension   . Dizziness     a. chronic. Admission for this 07/18/2014  . History of falling July 2015    due to dizziness from medications  . Ischemic cardiomyopathy     EF 18% by nuclear study 2016, multiple myocardial infarctions in past    . Chronic kidney disease, stage IV (severe) 12/11/2013  . DM type 2, uncontrolled, with renal complications 8/37/2902  . Obesity 12/12/2013    ROS:   All systems reviewed and negative except as noted in the HPI.   Past Surgical History  Procedure Laterality Date  . Internal defibrillator and pacemaker  2002  . Abdominal and perineal resection of rectum with total mesorectal excision  10/04/2007  . Colonoscopy  09/14/2011    Dr. Gala [name]: via colostomy, Single pedunculated benign inflammatory polyp. Due for surveillance Oct 2015  . Colostomy  2008  . Colonoscopy N/A 07/02/2014    Procedure:  COLONOSCOPY;  Surgeon: [name] Dolin, MD;  Location: [facility];  Service: Endoscopy;  Laterality: N/A;  7:30 / COLONOSCOPY THRU COLOSTOMY  . Esophagogastroduodenoscopy N/A 07/02/2014    Procedure: ESOPHAGOGASTRODUODENOSCOPY (EGD);  Surgeon: [name] Dolin, MD;  Location: [facility];  Service: Endoscopy;  Laterality: N/A;  7:30  . Savory dilation N/A 07/02/2014    Procedure: SAVORY DILATION;  Surgeon: [name] Dolin, MD;  Location: [facility];  Service: Endoscopy;  Laterality: N/A;  7:30  . Maloney dilation N/A 07/02/2014    Procedure: [name] [name] DILATION;  Surgeon: [name] Dolin, MD;  Location: [facility];  Service: Endoscopy;  Laterality: N/A;  7:30  . Portacath placement  06/2007    "removed ~ 1 yr later"  . Cardiac catheterization  08/2001  . Coronary angioplasty with stent placement  2001; ~ 2006    "1 + 1"   . Left heart catheterization with coronary angiogram N/A 07/13/2013    Procedure: LEFT HEART CATHETERIZATION WITH CORONARY ANGIOGRAM;  Surgeon: [name] Harp, MD;  Location: [facility] CATH LAB;  Service: Cardiovascular;  Laterality: N/A;  . Colonoscopy N/A 12/11/2014    Dr. Gala [name] via colostomy. Normal. Repeat in 2021.   Marland Kitchen Esophagogastroduodenoscopy N/A 12/11/2014    [id number] EGD  . Cardiac defibrillator placement  2009    Upgraded to a BiV ICD  . Right heart catheterization N/A 02/24/2015    Procedure: RIGHT HEART CATH;  Surgeon: [name] Artist, MD;  Location: [facility] CATH LAB;  Service: Cardiovascular;  Laterality: N/A;     Family History  Problem Relation Age of Onset  . Colon cancer Mother 4  . Colon cancer Sister 43  . Coronary artery disease Father   . Colon cancer Other     2 cousins, succumbed to illness     History   Social History  . Marital Status: Married    Spouse Name: N/A  . Number of Children: 1  . Years of Education: N/A   Occupational History  .     Social History Main Topics  . Smoking status: Never Smoker   . Smokeless tobacco: Never Used  .  Alcohol Use: No     Comment: Former user 45 years ago  . Drug Use: No  . Sexual Activity: No   Other Topics Concern  . Not on file   Social History Narrative     BP 92/62 mmHg  Pulse 64  Ht 5\' 10"  (1.778 m)  Wt 209 lb 2 oz (94.858 kg)  BMI 30.01 kg/m2  Physical Exam:  Stable appearing middle aged man, NAD HEENT: Unremarkable Neck:  7 cm JVD, no thyromegally Back:  No CVA tenderness Lungs:  Clear with no wheezes, rales, or rhonchi HEART:  Regular rate rhythm, no murmurs, no rubs, no clicks, soft S4 gallop Abd:  soft, positive bowel sounds, no organomegally, no rebound, no guarding Ext:  2 plus pulses, no edema, no cyanosis, no clubbing Skin:  No rashes no nodules Neuro:  CN II through XII intact, motor grossly intact   DEVICE  Normal device function.  See PaceArt for details. Underlying rhythm is NSR  Assess/Plan:

## 2015-04-10 NOTE — Discharge Instructions (Signed)
Pacemaker Battery Change, Care After °Refer to this sheet in the next few weeks. These instructions provide you with information on caring for yourself after your procedure. Your health care provider may also give you more specific instructions. Your treatment has been planned according to current medical practices, but problems sometimes occur. Call your health care provider if you have any problems or questions after your procedure. °WHAT TO EXPECT AFTER THE PROCEDURE °After your procedure, it is typical to have the following sensations: °· Soreness at the pacemaker site. °HOME CARE INSTRUCTIONS  °· Keep the incision clean and dry. °· Unless advised otherwise, you may shower beginning 48 hours after your procedure. °· For the first week after the replacement, avoid stretching motions that pull at the incision site, and avoid heavy exercise with the arm that is on the same side as the incision. °· Take medicines only as directed by your health care provider. °· Keep all follow-up visits as directed by your health care provider. °SEEK MEDICAL CARE IF:  °· You have pain at the incision site that is not relieved by over-the-counter or prescription medicine. °· There is drainage or pus from the incision site. °· There is swelling larger than a lime at the incision site. °· You develop red streaking that extends above or below the incision site. °· You feel brief, intermittent palpitations, light-headedness, or any symptoms that you feel might be related to your heart. °SEEK IMMEDIATE MEDICAL CARE IF:  °· You experience chest pain that is different than the pain at the pacemaker site. °· You experience shortness of breath. °· You have palpitations or irregular heartbeat. °· You have light-headedness that does not go away quickly. °· You faint. °· You have pain that gets worse and is not relieved by medicine. °Document Released: 09/12/2013 Document Revised: 04/08/2014 Document Reviewed: 09/12/2013 °Exit[employer]® Patient  Information ©2015 [employer]. This information is not intended to replace advice given to you by your health care provider. Make sure you discuss any questions you have with your health care provider. ° °

## 2015-04-11 DIAGNOSIS — I5023 Acute on chronic systolic (congestive) heart failure: Secondary | ICD-10-CM | POA: Diagnosis not present

## 2015-04-11 DIAGNOSIS — N184 Chronic kidney disease, stage 4 (severe): Secondary | ICD-10-CM | POA: Diagnosis not present

## 2015-04-11 DIAGNOSIS — I2510 Atherosclerotic heart disease of native coronary artery without angina pectoris: Secondary | ICD-10-CM | POA: Diagnosis not present

## 2015-04-11 DIAGNOSIS — E1122 Type 2 diabetes mellitus with diabetic chronic kidney disease: Secondary | ICD-10-CM | POA: Diagnosis not present

## 2015-04-11 DIAGNOSIS — I480 Paroxysmal atrial fibrillation: Secondary | ICD-10-CM | POA: Diagnosis not present

## 2015-04-11 DIAGNOSIS — E1165 Type 2 diabetes mellitus with hyperglycemia: Secondary | ICD-10-CM | POA: Diagnosis not present

## 2015-04-11 MED FILL — Lidocaine HCl Local Preservative Free (PF) Inj 1%: INTRAMUSCULAR | Qty: 30 | Status: AC

## 2015-04-14 DIAGNOSIS — C61 Malignant neoplasm of prostate: Secondary | ICD-10-CM | POA: Diagnosis not present

## 2015-04-14 DIAGNOSIS — R310 Gross hematuria: Secondary | ICD-10-CM | POA: Diagnosis not present

## 2015-04-15 DIAGNOSIS — E119 Type 2 diabetes mellitus without complications: Secondary | ICD-10-CM | POA: Diagnosis not present

## 2015-04-15 DIAGNOSIS — I5023 Acute on chronic systolic (congestive) heart failure: Secondary | ICD-10-CM | POA: Diagnosis not present

## 2015-04-15 DIAGNOSIS — K8020 Calculus of gallbladder without cholecystitis without obstruction: Secondary | ICD-10-CM | POA: Diagnosis not present

## 2015-04-15 DIAGNOSIS — Z85048 Personal history of other malignant neoplasm of rectum, rectosigmoid junction, and anus: Secondary | ICD-10-CM | POA: Diagnosis not present

## 2015-04-15 DIAGNOSIS — I255 Ischemic cardiomyopathy: Secondary | ICD-10-CM | POA: Diagnosis not present

## 2015-04-15 DIAGNOSIS — Z8546 Personal history of malignant neoplasm of prostate: Secondary | ICD-10-CM | POA: Diagnosis not present

## 2015-04-15 DIAGNOSIS — E1122 Type 2 diabetes mellitus with diabetic chronic kidney disease: Secondary | ICD-10-CM | POA: Diagnosis not present

## 2015-04-15 DIAGNOSIS — I10 Essential (primary) hypertension: Secondary | ICD-10-CM | POA: Diagnosis not present

## 2015-04-15 DIAGNOSIS — E1165 Type 2 diabetes mellitus with hyperglycemia: Secondary | ICD-10-CM | POA: Diagnosis not present

## 2015-04-15 DIAGNOSIS — I2510 Atherosclerotic heart disease of native coronary artery without angina pectoris: Secondary | ICD-10-CM | POA: Diagnosis not present

## 2015-04-15 DIAGNOSIS — Z95810 Presence of automatic (implantable) cardiac defibrillator: Secondary | ICD-10-CM | POA: Diagnosis not present

## 2015-04-15 DIAGNOSIS — I482 Chronic atrial fibrillation: Secondary | ICD-10-CM | POA: Diagnosis not present

## 2015-04-15 DIAGNOSIS — R634 Abnormal weight loss: Secondary | ICD-10-CM | POA: Diagnosis not present

## 2015-04-15 DIAGNOSIS — I5020 Unspecified systolic (congestive) heart failure: Secondary | ICD-10-CM | POA: Diagnosis not present

## 2015-04-15 DIAGNOSIS — I480 Paroxysmal atrial fibrillation: Secondary | ICD-10-CM | POA: Diagnosis not present

## 2015-04-15 DIAGNOSIS — N184 Chronic kidney disease, stage 4 (severe): Secondary | ICD-10-CM | POA: Diagnosis not present

## 2015-04-17 ENCOUNTER — Ambulatory Visit (INDEPENDENT_AMBULATORY_CARE_PROVIDER_SITE_OTHER): Payer: Medicare Other | Admitting: *Deleted

## 2015-04-17 DIAGNOSIS — E1122 Type 2 diabetes mellitus with diabetic chronic kidney disease: Secondary | ICD-10-CM | POA: Diagnosis not present

## 2015-04-17 DIAGNOSIS — I480 Paroxysmal atrial fibrillation: Secondary | ICD-10-CM | POA: Diagnosis not present

## 2015-04-17 DIAGNOSIS — I2510 Atherosclerotic heart disease of native coronary artery without angina pectoris: Secondary | ICD-10-CM | POA: Diagnosis not present

## 2015-04-17 DIAGNOSIS — I4901 Ventricular fibrillation: Secondary | ICD-10-CM

## 2015-04-17 DIAGNOSIS — I5022 Chronic systolic (congestive) heart failure: Secondary | ICD-10-CM

## 2015-04-17 DIAGNOSIS — N184 Chronic kidney disease, stage 4 (severe): Secondary | ICD-10-CM | POA: Diagnosis not present

## 2015-04-17 DIAGNOSIS — I5023 Acute on chronic systolic (congestive) heart failure: Secondary | ICD-10-CM

## 2015-04-17 DIAGNOSIS — I255 Ischemic cardiomyopathy: Secondary | ICD-10-CM

## 2015-04-17 DIAGNOSIS — E1165 Type 2 diabetes mellitus with hyperglycemia: Secondary | ICD-10-CM | POA: Diagnosis not present

## 2015-04-18 LAB — CUP PACEART INCLINIC DEVICE CHECK
Brady Statistic RV Percent Paced: 95 %
Date Time Interrogation Session: 20160512202124
HighPow Impedance: 31.4766
Lead Channel Impedance Value: 350 Ohm
Lead Channel Impedance Value: 562.5 Ohm
Lead Channel Pacing Threshold Amplitude: 0.625 V
Lead Channel Pacing Threshold Amplitude: 1 V
Lead Channel Pacing Threshold Amplitude: 1 V
Lead Channel Pacing Threshold Pulse Width: 0.5 ms
Lead Channel Sensing Intrinsic Amplitude: 11 mV
Lead Channel Setting Pacing Amplitude: 2 V
Lead Channel Setting Pacing Amplitude: 2.5 V
Lead Channel Setting Pacing Pulse Width: 0.5 ms
Lead Channel Setting Pacing Pulse Width: 0.8 ms
MDC IDC MSMT BATTERY REMAINING LONGEVITY: 67.2 mo
MDC IDC MSMT LEADCHNL LV IMPEDANCE VALUE: 400 Ohm
MDC IDC MSMT LEADCHNL LV PACING THRESHOLD AMPLITUDE: 1 V
MDC IDC MSMT LEADCHNL LV PACING THRESHOLD PULSEWIDTH: 0.5 ms
MDC IDC MSMT LEADCHNL RA PACING THRESHOLD PULSEWIDTH: 0.5 ms
MDC IDC MSMT LEADCHNL RA SENSING INTR AMPL: 3.3 mV
MDC IDC MSMT LEADCHNL RV PACING THRESHOLD AMPLITUDE: 1 V
MDC IDC MSMT LEADCHNL RV PACING THRESHOLD PULSEWIDTH: 0.8 ms
MDC IDC MSMT LEADCHNL RV PACING THRESHOLD PULSEWIDTH: 0.8 ms
MDC IDC SET LEADCHNL RA PACING AMPLITUDE: 1.625
MDC IDC SET LEADCHNL RV SENSING SENSITIVITY: 0.5 mV
MDC IDC SET ZONE DETECTION INTERVAL: 270 ms
MDC IDC SET ZONE DETECTION INTERVAL: 320 ms
MDC IDC STAT BRADY RA PERCENT PACED: 93 %
Pulse Gen Serial Number: 7238042
Zone Setting Detection Interval: 400 ms

## 2015-04-18 NOTE — Progress Notes (Signed)
Wound check appointment. Steri-strips removed. Wound without redness or edema. Incision edges approximated, wound well healed. Normal device function. Thresholds, sensing, and impedances consistent with implant measurements. Device programmed at appropriate safety margins. Histogram distribution appropriate for patient and level of activity. 1518 mode switches (5.1%)---max dur. 28 mins, Max A 240, Max V 108 + Eliquis. No ventricular arrhythmias noted. Patient educated about wound care, arm mobility, lifting restrictions, shock plan. ROV w/GT/R on 8/25 @ 0915.

## 2015-04-19 IMAGING — CR DG CHEST 2V
2 series · 2 of 2 positions shown · non-contrast
Comparison: DG THORACIC SPINE 4V dated 11/24/2013; DG CHEST 1V PORT
dated 10/29/2013

CLINICAL DATA: Short of breath and chest pain

EXAM:
CHEST  2 VIEW

[view not recorded (1 of 2)]
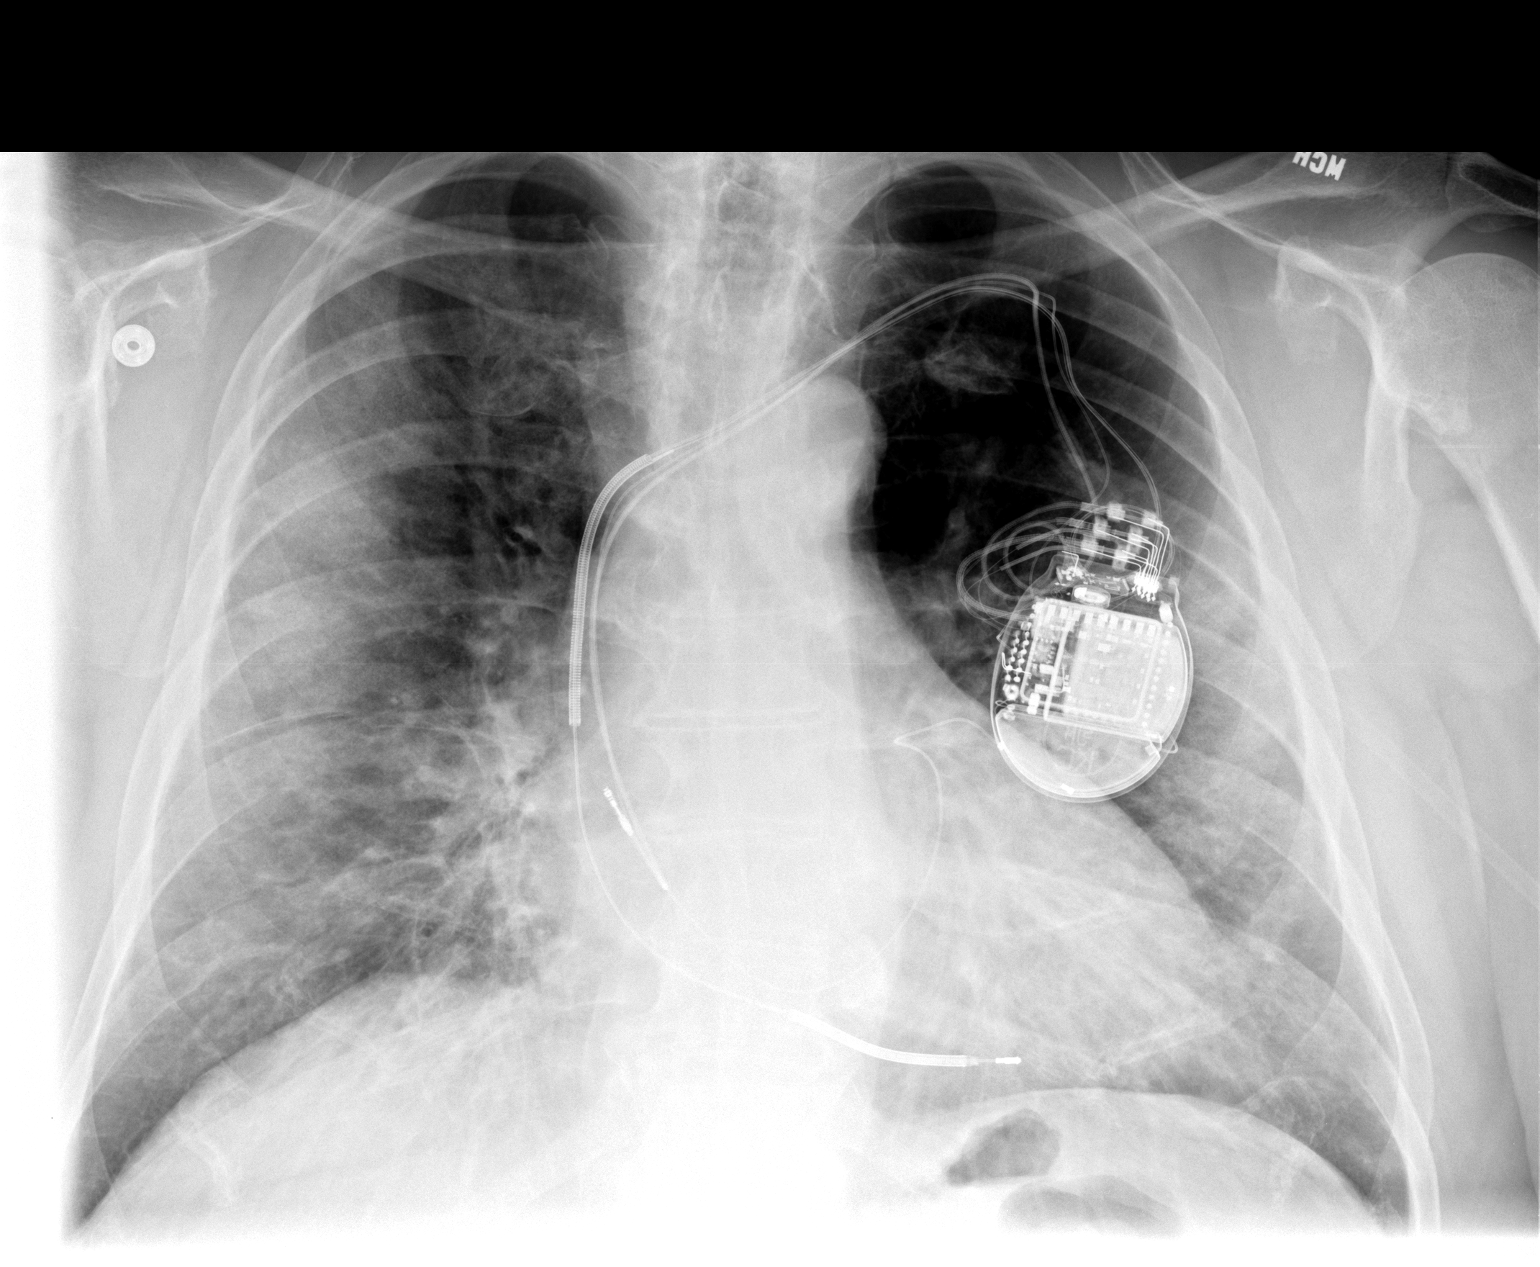

[view not recorded (2 of 2)]
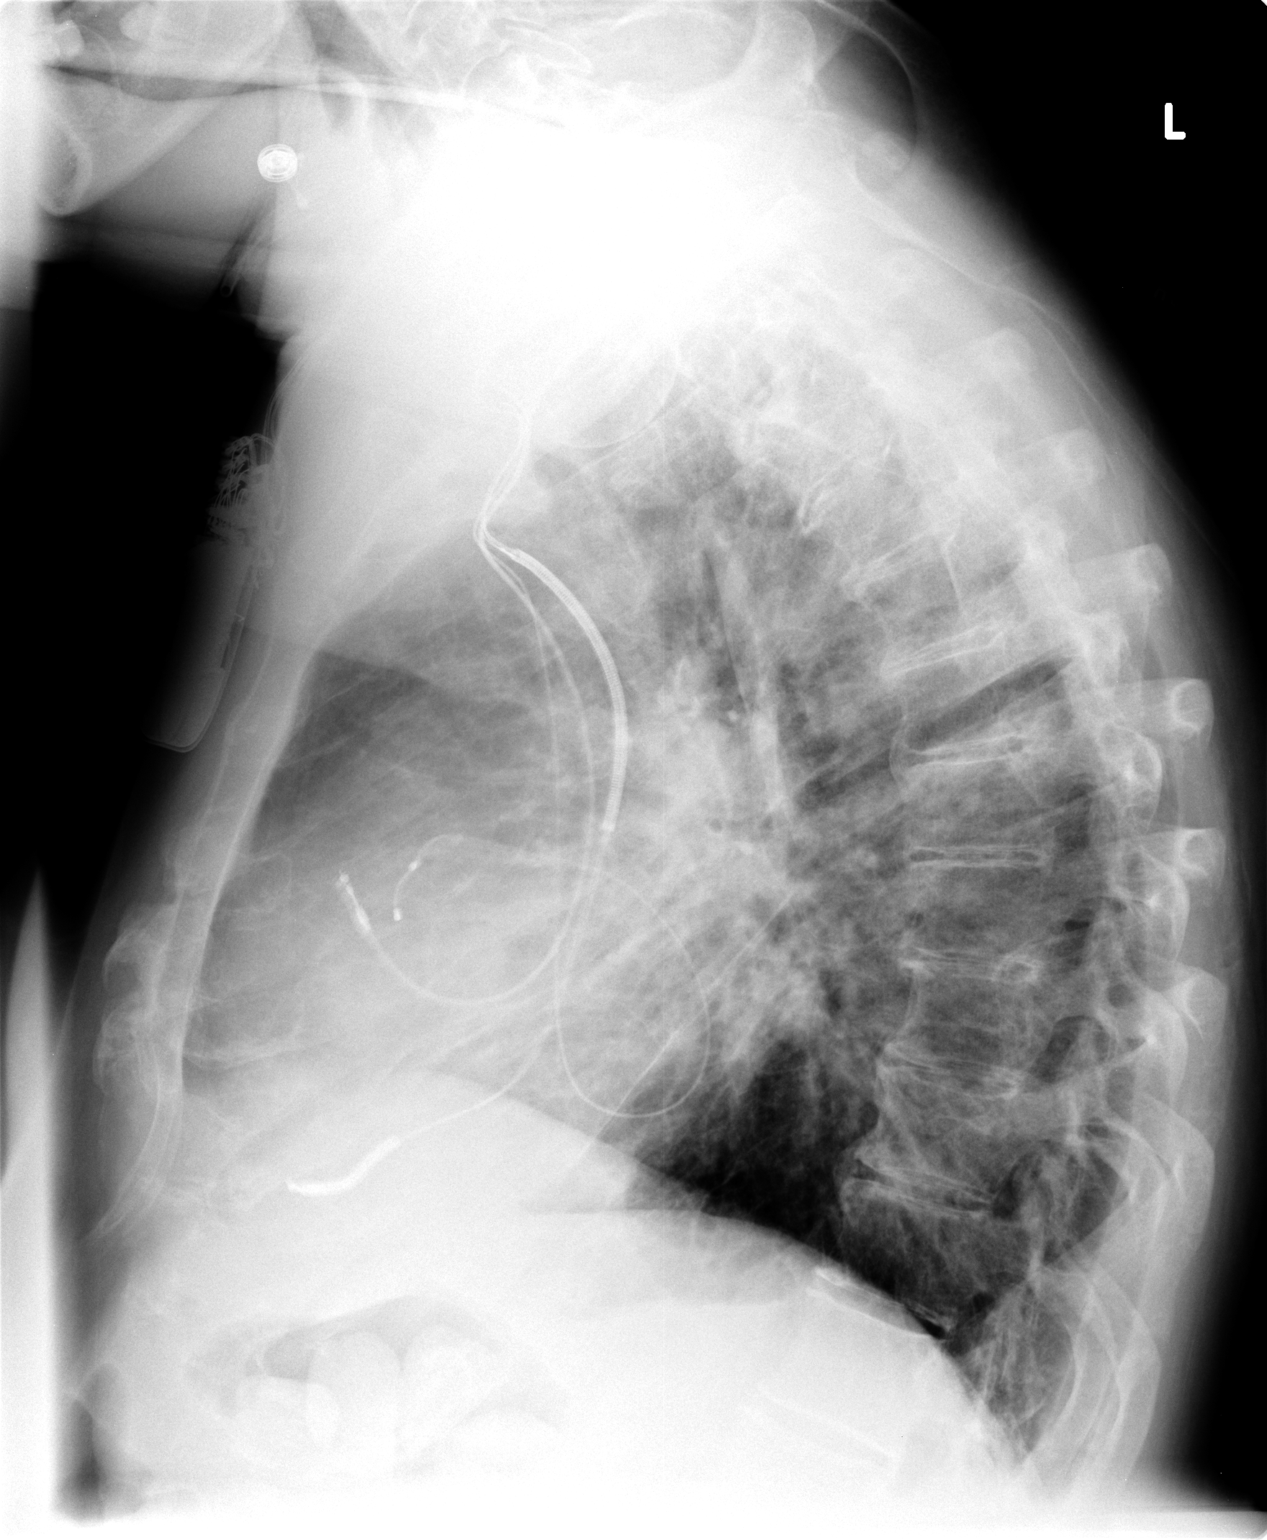

[2 of 2 positions shown; findings below may reference images not displayed]

FINDINGS: Left-sided pacemaker overlies normal cardiac silhouette. There is a
new bilateral peripheral airspace disease. No pleural fluid is
evident. There is some increased central venous congestion. No
pneumothorax.
IMPRESSION: New of peripheral airspace disease suggesting pulmonary edema.
Atypical infection felt less likely.

## 2015-04-21 ENCOUNTER — Ambulatory Visit (HOSPITAL_BASED_OUTPATIENT_CLINIC_OR_DEPARTMENT_OTHER): Payer: Medicare Other | Admitting: Oncology

## 2015-04-21 ENCOUNTER — Telehealth: Payer: Self-pay | Admitting: Oncology

## 2015-04-21 ENCOUNTER — Other Ambulatory Visit (HOSPITAL_BASED_OUTPATIENT_CLINIC_OR_DEPARTMENT_OTHER): Payer: Medicare Other

## 2015-04-21 VITALS — BP 95/47 | HR 77 | Temp 97.6°F | Resp 18 | Ht 70.0 in | Wt 204.1 lb

## 2015-04-21 DIAGNOSIS — C61 Malignant neoplasm of prostate: Secondary | ICD-10-CM

## 2015-04-21 DIAGNOSIS — E119 Type 2 diabetes mellitus without complications: Secondary | ICD-10-CM | POA: Diagnosis not present

## 2015-04-21 DIAGNOSIS — C20 Malignant neoplasm of rectum: Secondary | ICD-10-CM | POA: Diagnosis not present

## 2015-04-21 DIAGNOSIS — Z85048 Personal history of other malignant neoplasm of rectum, rectosigmoid junction, and anus: Secondary | ICD-10-CM | POA: Diagnosis not present

## 2015-04-21 DIAGNOSIS — K629 Disease of anus and rectum, unspecified: Secondary | ICD-10-CM

## 2015-04-21 NOTE — Progress Notes (Addendum)
[facility] OFFICE PROGRESS NOTE   Diagnosis: Rectal cancer  INTERVAL HISTORY:   [name] returns prior to a scheduled visit. He has been admitted several times over the past several months including an admission in March with heart failure and an admission in April with a urinary tract infection. He is currently undergoing an evaluation for "gallbladder disease "with Dr. Dalbert Batman. He underwent a CT of the abdomen and pelvis on 04/05/2015. No focal liver lesions. A small calcified gallstone was noted. Adjacent to the rectum in the region of the ischiorectal fossa a small soft tissue nodule Measured 2.3 x 2.6 x 2.7 cm suspicious for a recurrence of rectal cancer. No retroperitoneal or mesenteric adenopathy. The CEA returned normal at 1.4 on 04/06/2015.  [name] denies pain. The colostomy is functioning well. He feels well at present. Objective:  Vital signs in last 24 hours:  Blood pressure 95/47, pulse 77, temperature 97.6 F (36.4 C), temperature source Oral, resp. rate 18, height 5\' 10"  (1.778 m), weight 204 lb 1.6 oz (92.579 kg), SpO2 100 %.    HEENT: Neck without mass Lymphatics: No cervical, supra-clavicular, axillary, or inguinal nodes Resp: Lungs clear bilaterally Cardio: Regular rate and rhythm GI: No hepatosplenomegaly, no mass, nontender, left lower quadrant colostomy Vascular: No leg edema  Skin: Perineal scar without evidence of recurrent tumor. No mass or tenderness at the ischial area bilaterally     Lab Results:  Lab Results  Component Value Date   WBC 7.4 04/08/2015   HGB 8.3* 04/08/2015   HCT 27.4* 04/08/2015   MCV 96.1 04/08/2015   PLT 225 04/08/2015   NEUTROABS 5.6 04/08/2015   Creatinine 2.2  Lab Results  Component Value Date   CEA 1.4 04/06/2015    Imaging: As per history of present illness, I reviewed CT from 04/05/2015, 02/07/2015, and 05/20/2014 with [name] and his wife. There is a soft tissue nodule adjacent to the  rectum on the current study. There is increased density in this area on previous studies without a discrete mass.   Medications: I have reviewed the patient's current medications.  Assessment/Plan: 1. Stage III rectal cancer, diagnosed in June of 2008: Status post neoadjuvant infusional 5-FU and concurrent radiation. He underwent an APR 10/04/2007 with the pathology confirming stage III disease. He completed 8 cycles of adjuvant FOLFOX therapy on 03/12/2008. A restaging CT 05/26/2010 showed no evidence of metastatic disease. He underwent a colonoscopy in October of 2012 with removal of a single pedunculated polyp-benign pathology 2. Prostate cancer: Status post radiation and 2 years of Lupron therapy per Dr. Terance Hart. 3. History of thrombocytopenia secondary to chemotherapy. The platelet count was normal on 05/02/12. 4. History of delayed nausea secondary to chemotherapy: Improved with Aloxi. 5. History of oxaliplatin neuropathy. 6. Ischemic cardiomyopathy followed by Dr. Rollene Fare. 7. History of bilateral axillary fullness. 8. Diabetes. 9. Status post Port-A-Cath removal. 10. Hospitalization with pneumonia October 2012. 11. Question small bilateral inguinal lymph nodes on exam 04/17/2013-? Small left inguinal node on exam 07/17/2013. 12. CT 04/05/2015 with a 2.7cm perirectal nodule  Disposition:  [name] remains in clinical remission from rectal cancer. He is now 8 years out from diagnosis. I reviewed the 04/05/2015 CT images with [name] and his wife. He understands the perirectal nodule could represent recurrent rectal cancer, scar tissue, or another process. It is interesting the nodule was not clearly appreciated on a CT less than 2 months prior to the current study. I will review the CT  images in radiology and decide on the indication for a PET scan or biopsy.  [name] will return for a scheduled office visit and CEA in 4 months. We will see him sooner pending further review of  the CT images.  [name], MD  04/21/2015  11:45 AM   I reviewed the 04/05/2015 CT with a radiologist. The perirectal "nodule "has fluid density and is most suggestive of a fluid collection related to venous congestion. There is a low clinical suspicion for recurrent rectal cancer. The plan is to obtain a repeat CT at a 3 to 13-month interval.

## 2015-04-21 NOTE — Telephone Encounter (Signed)
Printed avs. °

## 2015-04-22 ENCOUNTER — Other Ambulatory Visit: Payer: Self-pay | Admitting: Oncology

## 2015-04-22 DIAGNOSIS — Z85048 Personal history of other malignant neoplasm of rectum, rectosigmoid junction, and anus: Secondary | ICD-10-CM

## 2015-04-22 LAB — CEA: CEA: 0.9 ng/mL (ref 0.0–5.0)

## 2015-04-22 IMAGING — CR DG CHEST 1V PORT
1 series · 1 of 1 positions shown · non-contrast
Comparison: 12/08/2013 and 11/24/2013.

CLINICAL DATA: Hypotension today. Influenza. History of congestive
heart failure, myocardial infarction and prostate cancer.

EXAM:
PORTABLE CHEST - 1 VIEW

[portable]
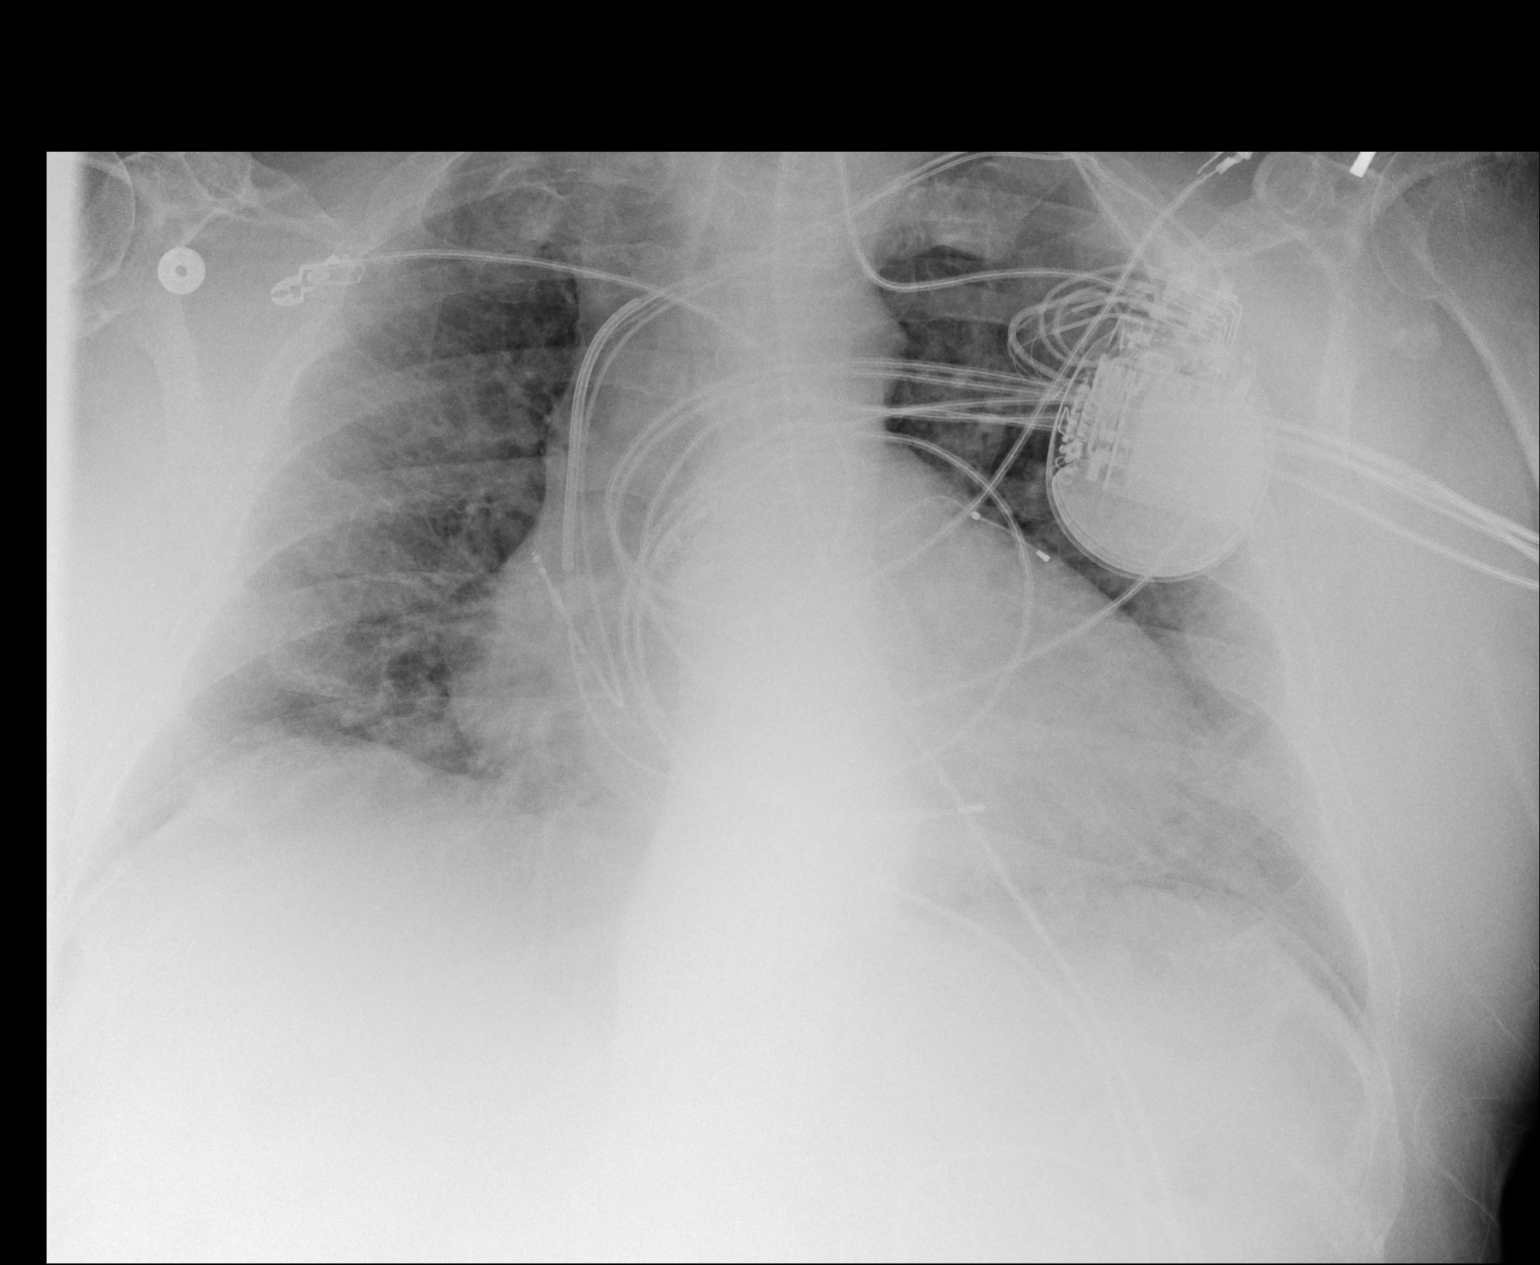

[1 of 1 positions shown; findings below may reference images not displayed]

FINDINGS: 9903 hr. The left subclavian pacemaker/ AICD leads appear grossly
unchanged within the right atrium, right ventricle and coronary
sinus. Multiple telemetry leads overlie the chest. Cardiomegaly
appears stable. There is vascular congestion with probable mild
residual perihilar airspace disease on the right. The overall
aeration of the lungs has improved over the last 3 days. There is no
pleural effusion or pneumothorax.
IMPRESSION: Improving airspace opacities with residual asymmetric component on
the right, possibly due to asymmetric edema or atypical
inflammation. No consolidation or significant pleural effusion.

## 2015-04-23 ENCOUNTER — Other Ambulatory Visit: Payer: Self-pay

## 2015-04-23 ENCOUNTER — Encounter (HOSPITAL_COMMUNITY): Payer: Self-pay | Admitting: Emergency Medicine

## 2015-04-23 ENCOUNTER — Inpatient Hospital Stay (HOSPITAL_COMMUNITY)
Admission: EM | Admit: 2015-04-23 | Discharge: 2015-04-27 | DRG: 280 | Disposition: A | Discharge status: Home Health | Payer: Medicare Other | Attending: Cardiology | Admitting: Cardiology

## 2015-04-23 ENCOUNTER — Inpatient Hospital Stay (HOSPITAL_COMMUNITY): Payer: Medicare Other

## 2015-04-23 ENCOUNTER — Other Ambulatory Visit (HOSPITAL_COMMUNITY): Payer: Self-pay

## 2015-04-23 ENCOUNTER — Emergency Department (HOSPITAL_COMMUNITY): Payer: Medicare Other

## 2015-04-23 DIAGNOSIS — Z7951 Long term (current) use of inhaled steroids: Secondary | ICD-10-CM

## 2015-04-23 DIAGNOSIS — N179 Acute kidney failure, unspecified: Secondary | ICD-10-CM | POA: Diagnosis not present

## 2015-04-23 DIAGNOSIS — R0902 Hypoxemia: Secondary | ICD-10-CM

## 2015-04-23 DIAGNOSIS — I5023 Acute on chronic systolic (congestive) heart failure: Secondary | ICD-10-CM | POA: Diagnosis not present

## 2015-04-23 DIAGNOSIS — G4733 Obstructive sleep apnea (adult) (pediatric): Secondary | ICD-10-CM | POA: Diagnosis present

## 2015-04-23 DIAGNOSIS — E1129 Type 2 diabetes mellitus with other diabetic kidney complication: Secondary | ICD-10-CM | POA: Diagnosis present

## 2015-04-23 DIAGNOSIS — N3091 Cystitis, unspecified with hematuria: Secondary | ICD-10-CM | POA: Diagnosis present

## 2015-04-23 DIAGNOSIS — R7989 Other specified abnormal findings of blood chemistry: Secondary | ICD-10-CM | POA: Diagnosis not present

## 2015-04-23 DIAGNOSIS — E46 Unspecified protein-calorie malnutrition: Secondary | ICD-10-CM | POA: Diagnosis present

## 2015-04-23 DIAGNOSIS — R0602 Shortness of breath: Secondary | ICD-10-CM | POA: Diagnosis not present

## 2015-04-23 DIAGNOSIS — N184 Chronic kidney disease, stage 4 (severe): Secondary | ICD-10-CM | POA: Diagnosis not present

## 2015-04-23 DIAGNOSIS — I110 Hypertensive heart disease with heart failure: Secondary | ICD-10-CM | POA: Diagnosis not present

## 2015-04-23 DIAGNOSIS — Z95810 Presence of automatic (implantable) cardiac defibrillator: Secondary | ICD-10-CM

## 2015-04-23 DIAGNOSIS — I480 Paroxysmal atrial fibrillation: Secondary | ICD-10-CM | POA: Diagnosis present

## 2015-04-23 DIAGNOSIS — R05 Cough: Secondary | ICD-10-CM | POA: Diagnosis not present

## 2015-04-23 DIAGNOSIS — J811 Chronic pulmonary edema: Secondary | ICD-10-CM | POA: Diagnosis not present

## 2015-04-23 DIAGNOSIS — Z8673 Personal history of transient ischemic attack (TIA), and cerebral infarction without residual deficits: Secondary | ICD-10-CM

## 2015-04-23 DIAGNOSIS — D696 Thrombocytopenia, unspecified: Secondary | ICD-10-CM | POA: Diagnosis not present

## 2015-04-23 DIAGNOSIS — Z955 Presence of coronary angioplasty implant and graft: Secondary | ICD-10-CM

## 2015-04-23 DIAGNOSIS — I5021 Acute systolic (congestive) heart failure: Secondary | ICD-10-CM | POA: Diagnosis not present

## 2015-04-23 DIAGNOSIS — K219 Gastro-esophageal reflux disease without esophagitis: Secondary | ICD-10-CM | POA: Diagnosis present

## 2015-04-23 DIAGNOSIS — I129 Hypertensive chronic kidney disease with stage 1 through stage 4 chronic kidney disease, or unspecified chronic kidney disease: Secondary | ICD-10-CM | POA: Diagnosis present

## 2015-04-23 DIAGNOSIS — D631 Anemia in chronic kidney disease: Secondary | ICD-10-CM | POA: Diagnosis present

## 2015-04-23 DIAGNOSIS — R7401 Elevation of levels of liver transaminase levels: Secondary | ICD-10-CM | POA: Diagnosis present

## 2015-04-23 DIAGNOSIS — Z7982 Long term (current) use of aspirin: Secondary | ICD-10-CM | POA: Diagnosis not present

## 2015-04-23 DIAGNOSIS — E1122 Type 2 diabetes mellitus with diabetic chronic kidney disease: Secondary | ICD-10-CM | POA: Diagnosis present

## 2015-04-23 DIAGNOSIS — D500 Iron deficiency anemia secondary to blood loss (chronic): Secondary | ICD-10-CM | POA: Diagnosis not present

## 2015-04-23 DIAGNOSIS — Z933 Colostomy status: Secondary | ICD-10-CM | POA: Diagnosis not present

## 2015-04-23 DIAGNOSIS — I509 Heart failure, unspecified: Secondary | ICD-10-CM

## 2015-04-23 DIAGNOSIS — I214 Non-ST elevation (NSTEMI) myocardial infarction: Secondary | ICD-10-CM | POA: Diagnosis not present

## 2015-04-23 DIAGNOSIS — R079 Chest pain, unspecified: Secondary | ICD-10-CM | POA: Diagnosis not present

## 2015-04-23 DIAGNOSIS — J9600 Acute respiratory failure, unspecified whether with hypoxia or hypercapnia: Secondary | ICD-10-CM | POA: Insufficient documentation

## 2015-04-23 DIAGNOSIS — E119 Type 2 diabetes mellitus without complications: Secondary | ICD-10-CM | POA: Diagnosis not present

## 2015-04-23 DIAGNOSIS — I255 Ischemic cardiomyopathy: Secondary | ICD-10-CM | POA: Diagnosis present

## 2015-04-23 DIAGNOSIS — E785 Hyperlipidemia, unspecified: Secondary | ICD-10-CM | POA: Diagnosis present

## 2015-04-23 DIAGNOSIS — I2510 Atherosclerotic heart disease of native coronary artery without angina pectoris: Secondary | ICD-10-CM | POA: Diagnosis present

## 2015-04-23 DIAGNOSIS — D649 Anemia, unspecified: Secondary | ICD-10-CM | POA: Diagnosis present

## 2015-04-23 DIAGNOSIS — Z923 Personal history of irradiation: Secondary | ICD-10-CM | POA: Diagnosis not present

## 2015-04-23 DIAGNOSIS — Z79899 Other long term (current) drug therapy: Secondary | ICD-10-CM

## 2015-04-23 DIAGNOSIS — Z9221 Personal history of antineoplastic chemotherapy: Secondary | ICD-10-CM | POA: Diagnosis not present

## 2015-04-23 DIAGNOSIS — Z85048 Personal history of other malignant neoplasm of rectum, rectosigmoid junction, and anus: Secondary | ICD-10-CM

## 2015-04-23 DIAGNOSIS — IMO0002 Reserved for concepts with insufficient information to code with codable children: Secondary | ICD-10-CM | POA: Diagnosis present

## 2015-04-23 DIAGNOSIS — Z87820 Personal history of traumatic brain injury: Secondary | ICD-10-CM

## 2015-04-23 DIAGNOSIS — E1165 Type 2 diabetes mellitus with hyperglycemia: Secondary | ICD-10-CM | POA: Diagnosis present

## 2015-04-23 DIAGNOSIS — J9601 Acute respiratory failure with hypoxia: Secondary | ICD-10-CM | POA: Diagnosis not present

## 2015-04-23 DIAGNOSIS — Z7901 Long term (current) use of anticoagulants: Secondary | ICD-10-CM

## 2015-04-23 DIAGNOSIS — N19 Unspecified kidney failure: Secondary | ICD-10-CM

## 2015-04-23 DIAGNOSIS — R740 Nonspecific elevation of levels of transaminase and lactic acid dehydrogenase [LDH]: Secondary | ICD-10-CM

## 2015-04-23 DIAGNOSIS — Z9989 Dependence on other enabling machines and devices: Secondary | ICD-10-CM

## 2015-04-23 DIAGNOSIS — Z8546 Personal history of malignant neoplasm of prostate: Secondary | ICD-10-CM

## 2015-04-23 DIAGNOSIS — I5020 Unspecified systolic (congestive) heart failure: Secondary | ICD-10-CM | POA: Insufficient documentation

## 2015-04-23 DIAGNOSIS — Z452 Encounter for adjustment and management of vascular access device: Secondary | ICD-10-CM | POA: Diagnosis not present

## 2015-04-23 DIAGNOSIS — Z8679 Personal history of other diseases of the circulatory system: Secondary | ICD-10-CM | POA: Diagnosis present

## 2015-04-23 DIAGNOSIS — I482 Chronic atrial fibrillation, unspecified: Secondary | ICD-10-CM | POA: Diagnosis present

## 2015-04-23 LAB — CBC WITH DIFFERENTIAL/PLATELET
BASOS ABS: 0 10*3/uL (ref 0.0–0.1)
Basophils Relative: 0 % (ref 0–1)
Eosinophils Absolute: 0.3 10*3/uL (ref 0.0–0.7)
Eosinophils Relative: 3 % (ref 0–5)
HEMATOCRIT: 30 % — AB (ref 39.0–52.0)
Hemoglobin: 9.3 g/dL — ABNORMAL LOW (ref 13.0–17.0)
LYMPHS ABS: 1.4 10*3/uL (ref 0.7–4.0)
LYMPHS PCT: 12 % (ref 12–46)
MCH: 28.7 pg (ref 26.0–34.0)
MCHC: 31 g/dL (ref 30.0–36.0)
MCV: 92.6 fL (ref 78.0–100.0)
Monocytes Absolute: 0.6 10*3/uL (ref 0.1–1.0)
Monocytes Relative: 5 % (ref 3–12)
NEUTROS ABS: 9.5 10*3/uL — AB (ref 1.7–7.7)
Neutrophils Relative %: 80 % — ABNORMAL HIGH (ref 43–77)
PLATELETS: 229 10*3/uL (ref 150–400)
RBC: 3.24 MIL/uL — AB (ref 4.22–5.81)
RDW: 15.8 % — AB (ref 11.5–15.5)
WBC: 11.8 10*3/uL — AB (ref 4.0–10.5)

## 2015-04-23 LAB — RAPID URINE DRUG SCREEN, HOSP PERFORMED
Amphetamines: NOT DETECTED
BARBITURATES: NOT DETECTED
Benzodiazepines: NOT DETECTED
Cocaine: NOT DETECTED
Opiates: NOT DETECTED
Tetrahydrocannabinol: NOT DETECTED

## 2015-04-23 LAB — COMPREHENSIVE METABOLIC PANEL
ALK PHOS: 123 U/L (ref 38–126)
ALT: 14 U/L — ABNORMAL LOW (ref 17–63)
ANION GAP: 14 (ref 5–15)
AST: 25 U/L (ref 15–41)
Albumin: 3.1 g/dL — ABNORMAL LOW (ref 3.5–5.0)
BUN: 22 mg/dL — AB (ref 6–20)
CALCIUM: 9.5 mg/dL (ref 8.9–10.3)
CO2: 21 mmol/L — ABNORMAL LOW (ref 22–32)
Chloride: 99 mmol/L — ABNORMAL LOW (ref 101–111)
Creatinine, Ser: 2.37 mg/dL — ABNORMAL HIGH (ref 0.61–1.24)
GFR calc non Af Amer: 28 mL/min — ABNORMAL LOW (ref >60)
GFR, EST AFRICAN AMERICAN: 33 mL/min — AB (ref >60)
Glucose, Bld: 418 mg/dL — ABNORMAL HIGH (ref 65–99)
POTASSIUM: 4 mmol/L (ref 3.5–5.1)
SODIUM: 134 mmol/L — AB (ref 135–145)
Total Bilirubin: 1.7 mg/dL — ABNORMAL HIGH (ref 0.3–1.2)
Total Protein: 7.9 g/dL (ref 6.5–8.1)

## 2015-04-23 LAB — HEPARIN LEVEL (UNFRACTIONATED)

## 2015-04-23 LAB — TROPONIN I
Troponin I: 0.04 ng/mL — ABNORMAL HIGH (ref <0.031)
Troponin I: 3.72 ng/mL (ref <0.031)
Troponin I: 4.29 ng/mL (ref <0.031)

## 2015-04-23 LAB — I-STAT VENOUS BLOOD GAS, ED
ACID-BASE DEFICIT: 4 mmol/L — AB (ref 0.0–2.0)
BICARBONATE: 20.1 meq/L (ref 20.0–24.0)
O2 Saturation: 24 %
TCO2: 21 mmol/L (ref 0–100)
pCO2, Ven: 33.9 mmHg — ABNORMAL LOW (ref 45.0–50.0)
pH, Ven: 7.381 — ABNORMAL HIGH (ref 7.250–7.300)
pO2, Ven: 17 mmHg — CL (ref 30.0–45.0)

## 2015-04-23 LAB — MRSA PCR SCREENING: MRSA BY PCR: NEGATIVE

## 2015-04-23 LAB — MAGNESIUM: Magnesium: 1.9 mg/dL (ref 1.7–2.4)

## 2015-04-23 LAB — CBG MONITORING, ED: GLUCOSE-CAPILLARY: 356 mg/dL — AB (ref 65–99)

## 2015-04-23 LAB — GLUCOSE, CAPILLARY
GLUCOSE-CAPILLARY: 326 mg/dL — AB (ref 65–99)
Glucose-Capillary: 295 mg/dL — ABNORMAL HIGH (ref 65–99)

## 2015-04-23 LAB — BRAIN NATRIURETIC PEPTIDE: B Natriuretic Peptide: 1218.9 pg/mL — ABNORMAL HIGH (ref 0.0–100.0)

## 2015-04-23 LAB — DIGOXIN LEVEL: Digoxin Level: 0.6 ng/mL — ABNORMAL LOW (ref 0.8–2.0)

## 2015-04-23 LAB — APTT: aPTT: 35 seconds (ref 24–37)

## 2015-04-23 MED ORDER — FUROSEMIDE 10 MG/ML IJ SOLN
60.0000 mg | Freq: Two times a day (BID) | INTRAMUSCULAR | Status: DC
  Administered 2015-04-23: 60 mg via INTRAVENOUS
  Filled 2015-04-23: qty 6

## 2015-04-23 MED ORDER — ONDANSETRON HCL 4 MG/2ML IJ SOLN
4.0000 mg | Freq: Once | INTRAMUSCULAR | Status: AC
  Administered 2015-04-23: 4 mg via INTRAVENOUS

## 2015-04-23 MED ORDER — DIGOXIN 125 MCG PO TABS
125.0000 ug | ORAL_TABLET | ORAL | Status: DC
  Administered 2015-04-23 – 2015-04-27 (×3): 125 ug via ORAL
  Filled 2015-04-23 (×3): qty 1

## 2015-04-23 MED ORDER — ROSUVASTATIN CALCIUM 40 MG PO TABS
40.0000 mg | ORAL_TABLET | Freq: Every day | ORAL | Status: DC
  Administered 2015-04-23 – 2015-04-26 (×4): 40 mg via ORAL
  Filled 2015-04-23 (×5): qty 1

## 2015-04-23 MED ORDER — INSULIN ASPART 100 UNIT/ML ~~LOC~~ SOLN
0.0000 [IU] | Freq: Three times a day (TID) | SUBCUTANEOUS | Status: DC
Start: 2015-04-23 — End: 2015-04-27
  Administered 2015-04-23 – 2015-04-24 (×2): 11 [IU] via SUBCUTANEOUS
  Administered 2015-04-24: 2 [IU] via SUBCUTANEOUS
  Administered 2015-04-25: 8 [IU] via SUBCUTANEOUS
  Administered 2015-04-25: 2 [IU] via SUBCUTANEOUS
  Administered 2015-04-25 – 2015-04-26 (×2): 5 [IU] via SUBCUTANEOUS
  Administered 2015-04-26: 3 [IU] via SUBCUTANEOUS

## 2015-04-23 MED ORDER — ENALAPRIL MALEATE 10 MG PO TABS
10.0000 mg | ORAL_TABLET | Freq: Every day | ORAL | Status: DC
  Administered 2015-04-23: 10 mg via ORAL
  Filled 2015-04-23: qty 1

## 2015-04-23 MED ORDER — SODIUM CHLORIDE 0.9 % IJ SOLN
3.0000 mL | Freq: Two times a day (BID) | INTRAMUSCULAR | Status: DC
  Administered 2015-04-23 – 2015-04-25 (×4): 3 mL via INTRAVENOUS
  Administered 2015-04-26: 10 mL via INTRAVENOUS
  Administered 2015-04-26: 3 mL via INTRAVENOUS

## 2015-04-23 MED ORDER — ASPIRIN EC 325 MG PO TBEC
325.0000 mg | DELAYED_RELEASE_TABLET | Freq: Once | ORAL | Status: AC
  Administered 2015-04-23: 325 mg via ORAL
  Filled 2015-04-23: qty 1

## 2015-04-23 MED ORDER — INSULIN ASPART 100 UNIT/ML ~~LOC~~ SOLN: 0.0000 [IU] | Freq: Three times a day (TID) | SUBCUTANEOUS | Status: DC

## 2015-04-23 MED ORDER — PROMETHAZINE HCL 25 MG/ML IJ SOLN
12.5000 mg | INTRAMUSCULAR | Status: DC | PRN
  Filled 2015-04-23: qty 1

## 2015-04-23 MED ORDER — SODIUM CHLORIDE 0.9 % IJ SOLN: 10.0000 mL | INTRAMUSCULAR | Status: DC | PRN

## 2015-04-23 MED ORDER — NITROGLYCERIN IN D5W 200-5 MCG/ML-% IV SOLN
0.0000 ug/min | Freq: Once | INTRAVENOUS | Status: AC
  Administered 2015-04-23: 2.5 ug/min via INTRAVENOUS
  Filled 2015-04-23: qty 250

## 2015-04-23 MED ORDER — FLUTICASONE PROPIONATE 50 MCG/ACT NA SUSP: 1.0000 | Freq: Every day | NASAL | Status: DC | PRN

## 2015-04-23 MED ORDER — ASPIRIN EC 81 MG PO TBEC
81.0000 mg | DELAYED_RELEASE_TABLET | Freq: Every day | ORAL | Status: DC
  Administered 2015-04-24 – 2015-04-27 (×4): 81 mg via ORAL
  Filled 2015-04-23 (×4): qty 1

## 2015-04-23 MED ORDER — HEPARIN (PORCINE) IN NACL 100-0.45 UNIT/ML-% IJ SOLN
1100.0000 [IU]/h | INTRAMUSCULAR | Status: DC
  Administered 2015-04-23: 1100 [IU]/h via INTRAVENOUS
  Filled 2015-04-23: qty 250

## 2015-04-23 MED ORDER — APIXABAN 5 MG PO TABS
5.0000 mg | ORAL_TABLET | Freq: Two times a day (BID) | ORAL | Status: DC
  Administered 2015-04-23: 5 mg via ORAL
  Filled 2015-04-23 (×2): qty 1

## 2015-04-23 MED ORDER — FUROSEMIDE 10 MG/ML IJ SOLN
60.0000 mg | Freq: Once | INTRAMUSCULAR | Status: AC
  Administered 2015-04-23: 60 mg via INTRAVENOUS
  Filled 2015-04-23: qty 6

## 2015-04-23 MED ORDER — CO-ENZYME Q-10 50 MG PO CAPS: 50.0000 mg | ORAL_CAPSULE | Freq: Every morning | ORAL | Status: DC

## 2015-04-23 MED ORDER — INSULIN ASPART 100 UNIT/ML ~~LOC~~ SOLN
0.0000 [IU] | Freq: Every day | SUBCUTANEOUS | Status: DC
  Administered 2015-04-23: 3 [IU] via SUBCUTANEOUS
  Administered 2015-04-24 – 2015-04-26 (×2): 2 [IU] via SUBCUTANEOUS

## 2015-04-23 MED ORDER — INSULIN ASPART 100 UNIT/ML ~~LOC~~ SOLN: 0.0000 [IU] | Freq: Every day | SUBCUTANEOUS | Status: DC

## 2015-04-23 MED ORDER — SODIUM CHLORIDE 0.9 % IJ SOLN
10.0000 mL | Freq: Two times a day (BID) | INTRAMUSCULAR | Status: DC
  Administered 2015-04-23 – 2015-04-26 (×6): 10 mL

## 2015-04-23 MED ORDER — NITROGLYCERIN 0.4 MG SL SUBL
0.4000 mg | SUBLINGUAL_TABLET | SUBLINGUAL | Status: DC | PRN
  Administered 2015-04-25: 0.4 mg via SUBLINGUAL
  Filled 2015-04-23: qty 1

## 2015-04-23 MED ORDER — ISOSORBIDE DINITRATE 5 MG PO TABS
15.0000 mg | ORAL_TABLET | Freq: Two times a day (BID) | ORAL | Status: DC
Start: 2015-04-23 — End: 2015-04-26
  Administered 2015-04-23 – 2015-04-25 (×5): 15 mg via ORAL
  Filled 2015-04-23 (×8): qty 1

## 2015-04-23 MED ORDER — METOPROLOL SUCCINATE ER 25 MG PO TB24
25.0000 mg | ORAL_TABLET | Freq: Every day | ORAL | Status: DC
  Administered 2015-04-23: 25 mg via ORAL
  Filled 2015-04-23: qty 1

## 2015-04-23 MED ORDER — RANOLAZINE ER 500 MG PO TB12
500.0000 mg | ORAL_TABLET | Freq: Two times a day (BID) | ORAL | Status: DC
  Filled 2015-04-23: qty 1

## 2015-04-23 MED ORDER — RANOLAZINE ER 500 MG PO TB12
500.0000 mg | ORAL_TABLET | Freq: Two times a day (BID) | ORAL | Status: DC
  Administered 2015-04-23 – 2015-04-27 (×8): 500 mg via ORAL
  Filled 2015-04-23 (×10): qty 1

## 2015-04-23 MED ORDER — PANTOPRAZOLE SODIUM 40 MG PO TBEC
40.0000 mg | DELAYED_RELEASE_TABLET | Freq: Every day | ORAL | Status: DC
  Administered 2015-04-23 – 2015-04-27 (×5): 40 mg via ORAL
  Filled 2015-04-23 (×5): qty 1

## 2015-04-23 NOTE — Progress Notes (Signed)
Peripherally Inserted Central Catheter/Midline Placement  The IV Nurse has discussed with the patient and/or persons authorized to consent for the patient, the purpose of this procedure and the potential benefits and risks involved with this procedure.  The benefits include less needle sticks, lab draws from the catheter and patient may be discharged home with the catheter.  Risks include, but not limited to, infection, bleeding, blood clot (thrombus formation), and puncture of an artery; nerve damage and irregular heat beat.  Alternatives to this procedure were also discussed.  PICC/Midline Placement Documentation  PICC / Midline Double Lumen 04/23/15 PICC Right Brachial 39 cm 0 cm (Active)  Indication for Insertion or Continuance of Line Vasoactive infusions 04/23/2015  3:00 PM  Exposed Catheter (cm) 0 cm 04/23/2015  3:00 PM  Dressing Change Due 04/30/15 04/23/2015  3:00 PM       Jule Economy Horton 04/23/2015, 3:45 PM

## 2015-04-23 NOTE — Progress Notes (Signed)
PHARMACIST - PHYSICIAN ORDER COMMUNICATION  CONCERNING: P&T Medication Policy on Herbal Medications  DESCRIPTION:  This patient's order for:  Co-enzyme Q10  has been noted.  This product(s) is classified as an "herbal" or natural product. Due to a lack of definitive safety studies or FDA approval, nonstandard manufacturing practices, plus the potential risk of unknown drug-drug interactions while on inpatient medications, the Pharmacy and Therapeutics Committee does not permit the use of "herbal" or natural products of this type within [facility].   ACTION TAKEN: The pharmacy department is unable to verify this order at this time and your patient has been informed of this safety policy. Please reevaluate patient's clinical condition at discharge and address if the herbal or natural product(s) should be resumed at that time.   Uvaldo Rising, BCPS  Clinical Pharmacist Pager [phone]  04/23/2015 2:20 PM

## 2015-04-23 NOTE — ED Notes (Signed)
Per night RN, pt was not weighed due to respiratory instability. Did not want to exert pt due.

## 2015-04-23 NOTE — H&P (Signed)
Date: 04/23/2015               Patient Name:  [name] MRN: [id number]  DOB: 1956-03-27 Age / Sex: 59 y.o., male   PCP: Redmond School, MD         Medical Service: Internal Medicine Teaching Service         Attending Physician: Dr. Ellwood Dense    First Contact: Dr. Randell Patient  Pager: [phone]  Second Contact: Dr. Ronnald Ramp Pager: [phone]       After Hours (After 5p/  First Contact Pager: [phone]  weekends / holidays): Second Contact Pager: [phone]   Chief Complaint: SOB, chest pain  History of Present Illness:  59 y.o HTN, CAD s/p stents x 2 LAD, LCx, chronic systolic HF/ischemic cardiomyopathy, CKD 4, DM 2, GERD, paroxysmal AF, h/o VF, adenocarcinoma of rectum, prostate cancer, biventricular ICD (ICD/pacemaker) 04/10/15, TIA, OSA on cpap, HLD, orthostatic hypotension, dizziness, h/o falls, obesity, resolved hematuria, h/o gallbladder disease (follows with Dr. Dalbert Batman).   He presents for SOB "fluid" and chest pain since 2 am 5/18.  He states he came to the ED about 4 am.  He states he was sleeping in his recliner as he likes to do and experience a coughing spell and he was spitting up pink phelgm. He checked his O2 sat on RA at home it was 89 % HR 107 but he states he cough not catch his breath and was gasping for air at rest, wheezing and had a rattling noise in his lungs.  He tried a cough drop w/o relief and also tried Lasix 40 mg x 1 which he has been taking prn.  He reports orthopnea, resolved chest pain, resolved nausea which was 2/2 coughing spelling, wt is 201 today but normally 198.  He reports no leg swelling but RN in ED states they are slightly swollen and he reports the tops of his hands are swollen b/l and he had some abdominal tightness.  He denies increased fluid intake but reports recently eating 7-8 ritz crackers and cheddar cheese but otherwise does not eat salt.  He also had some mid chest pain w/o radiation which felt like a knife which is now resolved after Lasix and NTG gtt  started in the ED.  He denies associated n/v/sweating/sob related to chest pain.  He was placed on NRB in ED O2 low as 84% then changed to bipap 80% FIO2 turned down to 50%. Now he is on room air 2 L and doing well with improvement in breathing, chest pain resolved.  He has put out 1 L in the ED per RN.  ED provider contacted cardiology in the ED   Meds: Current Facility-Administered Medications  Medication Dose Route Frequency Provider Last Rate Last Dose  . apixaban (ELIQUIS) tablet 5 mg  5 mg Oral BID Cresenciano Genre, MD      . digoxin [facility]) tablet 125 mcg  125 mcg Oral QODAY Cresenciano Genre, MD      . enalapril (VASOTEC) tablet 10 mg  10 mg Oral Daily Cresenciano Genre, MD      . furosemide (LASIX) injection 60 mg  60 mg Intravenous BID Cresenciano Genre, MD   60 mg at 04/23/15 0948  . isosorbide dinitrate (ISORDIL) tablet 15 mg  15 mg Oral BID Cresenciano Genre, MD      . metoprolol succinate (TOPROL-XL) 24 hr tablet 25 mg  25 mg Oral Daily Cresenciano Genre, MD      .  nitroGLYCERIN (NITROSTAT) SL tablet 0.4 mg  0.4 mg Sublingual Q5 min PRN Cresenciano Genre, MD      . promethazine (PHENERGAN) injection 12.5 mg  12.5 mg Intravenous Q4H PRN Cresenciano Genre, MD       Current Outpatient Prescriptions  Medication Sig Dispense Refill  . apixaban (ELIQUIS) 5 MG TABS tablet Take 5 mg by mouth 2 (two) times daily.    Marland Kitchen aspirin EC 81 MG tablet Take 81 mg by mouth daily.    Marland Kitchen co-enzyme Q-10 50 MG capsule Take 50 mg by mouth every morning.     . digoxin (LANOXIN) 0.125 MG tablet Take 1 tablet by mouth every other day.  0  . enalapril (VASOTEC) 10 MG tablet Take 10 mg by mouth daily.  1  . fluticasone (FLONASE) 50 MCG/ACT nasal spray Place 1 spray into both nostrils daily as needed for allergies.     . furosemide (LASIX) 40 MG tablet Take 1 tablet (40 mg total) by mouth daily as needed. (Patient taking differently: Take 40 mg by mouth daily as needed for fluid or edema. ) 30 tablet 0  . isosorbide dinitrate  (ISORDIL) 30 MG tablet Take 15 mg by mouth 2 (two) times daily.     . metoprolol succinate (TOPROL-XL) 25 MG 24 hr tablet Take 50mg  (2 tablets) in am and 25mg  (1 tablet) in pm. 90 tablet 3  . nitroGLYCERIN (NITROLINGUAL) 0.4 MG/SPRAY spray Place 1 spray under the tongue every 5 (five) minutes as needed. angina (Patient taking differently: Place 1 spray under the tongue every 5 (five) minutes as needed for chest pain. angina) 12 g 3  . pantoprazole (PROTONIX) 40 MG tablet Take 1 tablet (40 mg total) by mouth daily. 30 tablet 6  . ranolazine (RANEXA) 500 MG 12 hr tablet Take 1 tablet (500 mg total) by mouth 2 (two) times daily. 60 tablet 6  . glipiZIDE (GLUCOTROL) 5 MG tablet Take 1 tablet (5 mg total) by mouth daily before breakfast. (Patient not taking: Reported on 04/17/2015) 30 tablet 2    Allergies: Allergies as of 04/23/2015  . (No Known Allergies)   Past Medical History  Diagnosis Date  . Essential hypertension, benign   . CAD (coronary artery disease)     a. BMS to LAD 2001 at [facility] b. PTCA/atherectomy ramus and BMS to LAD 2009  . Chronic systolic heart failure   . GERD (gastroesophageal reflux disease)   . Paroxysmal atrial fibrillation     a. on amiodarone, digoxin and Eliquis  . Adenocarcinoma of rectum     a. 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 Prostate cancer     a. s/p seed implants with chemo and radiation  . Biventricular ICD (implantable cardioverter-defibrillator) in place 2009    upgrade  . TIA (transient ischemic attack)   . OSA on CPAP   . HLD (hyperlipidemia)   . Orthostatic hypotension   . Dizziness     a. chronic. Admission for this 07/18/2014  . History of falling July 2015    due to dizziness from medications  . Ischemic cardiomyopathy     EF 18% by nuclear study 2016, multiple myocardial infarctions in past    . Chronic kidney disease, stage IV (severe) 12/11/2013  . DM type 2, uncontrolled, with renal complications 2/95/2841  . Obesity 12/12/2013  . Hematuria 03/2015   Past  Surgical History  Procedure Laterality Date  . Internal defibrillator and pacemaker  2002  . Abdominal and perineal resection of rectum with total  mesorectal excision  10/04/2007  . Colonoscopy  09/14/2011    Dr. Gala Romney: via colostomy, Single pedunculated benign inflammatory polyp. Due for surveillance Oct 2015  . Colostomy  2008  . Colonoscopy N/A 07/02/2014    Procedure: COLONOSCOPY;  Surgeon: Daneil Dolin, MD;  Location[facility];  Service: Endoscopy;  Laterality: N/A;  7:30 / COLONOSCOPY THRU COLOSTOMY  . Esophagogastroduodenoscopy N/A 07/02/2014    Procedure: ESOPHAGOGASTRODUODENOSCOPY (EGD);  Surgeon: Daneil Dolin, MD;  Location: [facility];  Service: Endoscopy;  Laterality: N/A;  7:30  . Savory dilation N/A 07/02/2014    Procedure: SAVORY DILATION;  Surgeon: Daneil Dolin, MD;  Location: [facility];  Service: Endoscopy;  Laterality: N/A;  7:30  . Maloney dilation N/A 07/02/2014    Procedure: Venia Minks DILATION;  Surgeon: Daneil Dolin, MD;  Location: [facility];  Service: Endoscopy;  Laterality: N/A;  7:30  . Portacath placement  06/2007    "removed ~ 1 yr later"  . Cardiac catheterization  08/2001  . Coronary angioplasty with stent placement  2001; ~ 2006    "1 + 1"   . Left heart catheterization with coronary angiogram N/A 07/13/2013    Procedure: LEFT HEART CATHETERIZATION WITH CORONARY ANGIOGRAM;  Surgeon: Lorretta Harp, MD;  Location: [facility] CATH LAB;  Service: Cardiovascular;  Laterality: N/A;  . Colonoscopy N/A 12/11/2014    Dr. Gala Romney via colostomy. Normal. Repeat in 2021.   Marland Kitchen Esophagogastroduodenoscopy N/A 12/11/2014    URK:YHCWCB EGD  . Cardiac defibrillator placement  2009    Upgraded to a BiV ICD  . Right heart catheterization N/A 02/24/2015    Procedure: RIGHT HEART CATH;  Surgeon: Jolaine Artist, MD;  Location: [facility] CATH LAB;  Service: Cardiovascular;  Laterality: N/A;  . Ep implantable device N/A 04/10/2015    Procedure: Ppm/Biv Ppm Generator Changeout;  Surgeon:  Evans Lance, MD;  Location: [facility] INVASIVE CV LAB CUPID;  Service: Cardiovascular;  Laterality: N/A;   Family History  Problem Relation Age of Onset  . Colon cancer Mother 73  . Colon cancer Sister 66  . Coronary artery disease Father   . Colon cancer Other     2 cousins, succumbed to illness   History   Social History  . Marital Status: Married    Spouse Name: N/A  . Number of Children: 1  . Years of Education: N/A   Occupational History  .     Social History Main Topics  . Smoking status: Never Smoker   . Smokeless tobacco: Never Used  . Alcohol Use: No     Comment: Former user 45 years ago  . Drug Use: No  . Sexual Activity: No   Other Topics Concern  . Not on file   Social History Narrative   Married 70 years   1 son, 2 grandkids    Denies alcohol, drugs, tobacco     Review of Systems: General: always cold, denies fever/chills  HEENT: denies h/a Cardiac: +chest pain resolved Pulm: +sob at rest, +cough Abd:+nausea, denies vomiting, denies ab pain, +ab distension  GU: denies blood in urine, dysuria  Ext: +slight leg edema  Neuro: denies h/a    Physical Exam: Blood pressure 115/60, pulse 78, resp. rate 24, weight 198 lb 12.8 oz (90.175 kg), SpO2 91 %.  Vitals reviewed. VS on Exam 81, 89-90 on 2.5 L, 17, BP 106/57 (68) then 110/56 (69) General: resting in bed, NAD HEENT: PERRL b/l, [place]/at, no scleral icterus Cardiac:  irreg rate controlled, no rubs, murmurs or gallops Pulm: slight crackles b/l  Abd: soft, nontender, slightly distended, BS present, obese Ext: warm and well perfused, 2+ pedal edema b/l Skin: scattered bruising to skin  Neuro: alert and oriented X3, cranial nerves II-XII grossly intact, moving all 4 extremities    Lab results: Basic Metabolic Panel:  Recent Labs  04/23/15 0503  NA 134*  K 4.0  CL 99*  CO2 21*  GLUCOSE 418*  BUN 22*  CREATININE 2.37*  CALCIUM 9.5   Liver Function Tests:  Recent Labs  04/23/15 0503  AST 25    ALT 14*  ALKPHOS 123  BILITOT 1.7*  PROT 7.9  ALBUMIN 3.1*   CBC:  Recent Labs  04/23/15 0503  WBC 11.8*  NEUTROABS 9.5*  HGB 9.3*  HCT 30.0*  MCV 92.6  PLT 229   Cardiac Enzymes:  Recent Labs  04/23/15 0503  TROPONINI 0.04*   BNP: Results for SARA, SELVIDGE (MRN [id number]) as of 04/23/2015 10:38  Ref. Range 04/23/2015 05:03  B Natriuretic Peptide Latest Ref Range: 0.0-100.0 pg/mL 1218.9 (H)     Fasting Lipid Panel: Results for JULIEN, OSCAR (MRN [id number]) as of 04/23/2015 10:38  Ref. Range 11/20/2014 06:08  Cholesterol Latest Ref Range: 0-200 mg/dL 186  Triglycerides Latest Ref Range: <150 mg/dL 180 (H)  HDL Cholesterol Latest Ref Range: >39 mg/dL 27 (L)  LDL (calc) Latest Ref Range: 0-99 mg/dL 123 (H)  VLDL Latest Ref Range: 0-40 mg/dL 36  Total CHOL/HDL Ratio Latest Units: RATIO 6.9   Urine Drug Screen: pending  Drugs of Abuse     Component Value Date/Time   LABOPIA POSITIVE* 04/03/2015 1532   COCAINSCRNUR NONE DETECTED 04/03/2015 1532   LABBENZ NONE DETECTED 04/03/2015 1532   AMPHETMU NONE DETECTED 04/03/2015 1532   THCU NONE DETECTED 04/03/2015 1532   LABBARB NONE DETECTED 04/03/2015 1532    Urinalysis: No results for input(s): COLORURINE, LABSPEC, PHURINE, GLUCOSEU, HGBUR, BILIRUBINUR, KETONESUR, PROTEINUR, UROBILINOGEN, NITRITE, LEUKOCYTESUR in the last 72 hours.  Invalid input(s): APPERANCEUR Misc. Labs: UDS  Urine Cx, UA  Trop x 2   Imaging results:  Dg Chest Port 1 View  04/23/2015   CLINICAL DATA:  Chest pain and shortness of breath for 1 day.  EXAM: PORTABLE CHEST - 1 VIEW  COMPARISON:  02/23/2015  FINDINGS: Left-sided pacemaker remains in place. Cardiomegaly is unchanged. There is vascular congestion and perihilar peribronchial cuffing suggestive of pulmonary edema. No confluent airspace disease to suggest pneumonia. No large pleural effusion or pneumothorax.  IMPRESSION: Vascular congestion and pulmonary edema, findings most consistent  with CHF. Cardiomegaly is stable.   Electronically Signed   By: Jeb Levering M.D.   On: 04/23/2015 05:55    Other results: EKG: AF paced, rate 100, long QT 614, no LVH, no acute changes ST/T  Assessment & Plan by Problem: 59 y.o with signficant pmh presents with acute hypoxic respiratory failure and acute on chronic systolic heart failure and chest pain.     #Acute respiratory failure with hypoxia likely 2/2 acute on chronic systolic heart failure exacerbation/ischemic cardiomyopathy -likely 2/2 pt unclear how to use Lasix prior to admission -Pt desaturated to 84 % placed on NRB then bipap now 2.5 L [place] doing well  -CXR with Vascular congestion and pulmonary edema, findings most consistent with CHF. Cardiomegaly is stable. 02/24/15 echo (TTE) EF 25-30% diffuse HK, severe hypokinesis mid apicalAS and apical myocardium, decreased LV diastolic compliance and/or increased left atrial pressure, mild MR, LA  mod dilated, RA mildly dilated PAP 39 mmhg  - proBNP 1218.9 up from 568 02/23/15 -Trop 0.04, will trend x 2  -given Lasix 60 iv x 1 in ED will continue Lasix 60 bid (was on Lasix 40 mg qd prior to admission), NTG gtt started and dc'ed, will continue home Isordil 15 mg bid, Vasotec 10 mg, Toprol 50 qam, 25 qpm  -s/p ICD on 04/10/15  -Digoxin  0.125 mg qod will resume(level low 0.6), Losartan 25 mg (was stopped at last discharge), Lasix was taking 40 mg prn prior to admission likely reason for admission  -bipap 80%-50% will try on trial off on [place] then place prn bipap -consider CXR in am 2 view -tele monitoring in SDU -strict i/o, daily weights  -cardiology consulted in ED will see f/u recs (Please comment on diuresis, resuming statin stopped last d/c 2/2 transaminitis) -Pt follows outpt with cardiology (Dr. Harl Bowie, Dr. Lovena Le, Dr. Sung Amabile)  #Chest pain (resolve) with h/o CAD post PCI LAD, LCx 2001/2009  -r/o ACS with slightly elevated troponin 0.04.  CP likely 2/2 fluid overload.  -will trend  Trop x 2  -Check UDS  -placed on home Ranexa 500 bid, Imdur 15 mg bid, continue Aspirin 81 mg qd in am 325 mg x 1 for now, prn NTG, prn O2, resume BB (Toprol XL 50 am and 25 pm).  -Crestor 40 mg qhs was stopped at last discharge due to transaminitis now resolved cards please comment on statin -cards to see   #PAF, rate controlled on Eliquis  -Eliquis 5 mg bid stopped last admission due to hematuria but pt resumed prior to admission and has been taking.  -will resume Eliquis, Digoxin, BB  -Of note he also had 12/2014 traumatic SAH 12/2014, Amiodarone 200 mg stopped at last discharge  -cardiology following (please comment on Amiodarone which was stopped last admission 2/2 elevated lfts)  #CKD 4  -BL Cr. 1.4-1.6 in 2015 though worsening. Creatinine elevated 2.37 up from 2.20 on 5/3 -pt follows with renal outpatient in [place] -strict i/o, daily weight  -trend renal fxn  #DM 2 with hyperglycemia (HA1C 8.9 06/01/02) w/renal complications -cbg 500 on admission  -will start SSI-M, qhs (was given Glipizide 5 mg qd prior to admission but pt had not started taking) -He was scheduled to see endocrine outpatient   #Leukocytosis  -WBC 11.8 on admission could be reactive. Recently tx'ed with Cipro for UTI x 10 days.  -Will repeat UA, Urine culture . He had f/u with Dr. Tresa Moore urology outpt for hematuria who stated hematuria resolved   #Normocytic anemia  -hemoglobin BL or stable  -trend CBC am  #GERD  -resume Protonix 40 mg qd   #Nausea resolved  -prn Zofran x 1 in ED, will change to prn phenerghan  #OSA  -cpap qhs   #H/o rectal stage 3 dx'ed 05/2007/prostate cancer with new perirectal nodule  -noted last admission s/p chemo, radiation, colostomy, 2 years of Lupron  -Last oncology Sept 2015. CT ab/pelvis with 2.7 cm nodule in right perirectal region concerning for recurrent cancer. CEA and PSA wnl -He was supposed to f/u with Dr. Benay Spice outpatient   #Long QT  -repeat EKG am  Protein  malnutrition  -Albumin 3.1  -consulted nutrition  Will need to resume H/H services at d/c case manager consulted   Dispo: Disposition is deferred at this time, awaiting improvement of current medical problems. Anticipated discharge in approximately 3-4 day(s).   The patient does have a current PCP Redmond School, MD)  and does not need an [facility] hospital follow-up appointment after discharge.  The patient does not have transportation limitations that hinder transportation to clinic appointments.  Signed: Cresenciano Genre, MD 04/23/2015, 10:50 AM

## 2015-04-23 NOTE — ED Notes (Signed)
Patient suddenly feels nauseated. Bipap removed from patient by this RN. Zofran given. RT at bedside to evaluate patient.

## 2015-04-23 NOTE — Progress Notes (Signed)
ANTICOAGULATION CONSULT NOTE - Initial Consult  Pharmacy Consult for apixaban >> heparin  Indication: chest pain/ACS and atrial fibrillation  No Known Allergies  Patient Measurements: Weight: 198 lb 12.8 oz (90.175 kg) Heparin Dosing Weight: 85kg  Vital Signs: BP: 107/62 mmHg (05/18 1100) Pulse Rate: 76 (05/18 1100)  Labs:  Recent Labs  04/23/15 0503 04/23/15 1100  HGB 9.3*  --   HCT 30.0*  --   PLT 229  --   CREATININE 2.37*  --   TROPONINI 0.04* 3.72*    Estimated Creatinine Clearance: 37.5 mL/min (by C-G formula based on Cr of 2.37).   Medical History: Past Medical History  Diagnosis Date  . Essential hypertension, benign   . CAD (coronary artery disease)     a. BMS to LAD 2001 at [facility] b. PTCA/atherectomy ramus and BMS to LAD 2009  . Chronic systolic heart failure   . GERD (gastroesophageal reflux disease)   . Paroxysmal atrial fibrillation     a. on amiodarone, digoxin and Eliquis  . Adenocarcinoma of rectum     a. 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 Prostate cancer     a. s/p seed implants with chemo and radiation  . Biventricular ICD (implantable cardioverter-defibrillator) in place 2009    upgrade  . TIA (transient ischemic attack)   . OSA on CPAP   . HLD (hyperlipidemia)   . Orthostatic hypotension   . Dizziness     a. chronic. Admission for this 07/18/2014  . History of falling July 2015    due to dizziness from medications  . Ischemic cardiomyopathy     EF 18% by nuclear study 2016, multiple myocardial infarctions in past    . Chronic kidney disease, stage IV (severe) 12/11/2013  . DM type 2, uncontrolled, with renal complications 2/72/5366  . Obesity 12/12/2013  . Hematuria 03/2015   Assessment: 59 year old with history of ischemic cardiomyopathy, CAD s/p BMS LAD in 2001, PTCA ramus and BMS LAD in 2009, Cardiolite (3/16) with EF 18%, no ischemia, prior anterior, apical and inferior infarctionBiV ICD upgrade 2009, paroxysmal atrial fibrillation, CKD, and recent  traumatic [facility] 1/16.  Patient admitted for volume overload/sob, found to have bump in CE's. Hx of afib on apixaban with dose given in ED this am and noted compliance at home. To transition to IV heparin tonight. HL>2.2 suggestive of recent apixaban, aptt 35 at baseline - will follow heparin based on aptt's for now. No bleeding issues noted.  Goal of Therapy:  Heparin level 0.3-0.7 aPTT 66-102 seconds Monitor platelets by anticoagulation protocol: Yes   Plan:  No heparin bolus Start heparin at 1100 units/hr 6 hour aptt, daily aptt/HL  Erin Hearing PharmD., BCPS Clinical Pharmacist Pager [phone] 04/23/2015 2:52 PM

## 2015-04-23 NOTE — Consult Note (Addendum)
Advanced Heart Failure Team Consult Note  Referring Physician: Dr Aundra Dubin  Primary Physician: Primary Cardiologist:  Dr Aundra Dubin   Reason for Consultation: Heart Failure/Chest Pain   HPI:   [name] is a 59 year old with history of ischemic cardiomyopathy, CAD s/p BMS LAD in 2001, PTCA ramus and BMS LAD in 2009, Cardiolite (3/16) with EF 18%, no ischemia, prior anterior, apical and inferior infarction   BiV ICD upgrade 2009, paroxysmal atrial fibrillation, CKD, and recent traumatic SAH.He has been followed at the heart failure clinic at [facility] by Dr Carolynn Serve in the past. He was recently (3/16) admitted from the [place] with exertional dyspnea/volume overload and had a cardiac arrest/ventricular fibrillation while in the hospital terminated by his ICD. After diuresis, RHC showed relatively normal filling pressures and preserved cardiac index. Echo in 3/16 showed EF 25-30% with restrictive diastolic function.  Admitted to [facility] April 28 th with abdominal pain and hematuria. LFTs elevated. Eliquis and amiodarone stopped. [place] suggestive of chronic cholecystitis. HIDA scan ok. ? Amiodarone toxicity. Discharged on May 1st off eliquis, losartan, crestor,  and amiodarone.   On May 11th, he had St Jude BiVi replaced by Dr Lovena Le. He restarted eliquis on May 15th and has not missed any doses.     Prior to admit his weight had been stable 198-201 pounds. Yesterday he had crackers and cheese. Drinking greater than 2 liters per day. He takes lasix as needed and took po lasix this morning with poor response. He developed dyspnea that continued to worsen.    He presented to [facility] ED with SOB and chest pain. CXR showed pulmonary edema and vascular congestion.  He received mg IV lasix (total of 120 mg) and NTG drip. Oxygen saturation 84% and he was placed on BiPap. Bipap was later weaned down to 2 liters  [place]. Nitro drip later stopped. Breathing improved with diuresis. EKG today shows A fib but in April he was  in NSR.  Of note amiodarone stopped April 28 th due to elevated LFTs.   Pertinent admission labs include: Dig 0.6,  K 4.0 Creatinine 2.37 Glucose 418  WBC 11.8 Hgb 9.3 Troponin 0.04 BNP 1218 Dig level 0.6   Review of Systems: [y] = yes, [ ]  = no   General: Weight gain [ ] ; Weight loss [ ] ; Anorexia [ ] ; Fatigue [Y ]; Fever [ ] ; Chills [ ] ; Weakness [Y ]  Cardiac: Chest pain/pressure [Y ]; Resting SOB [ ] ; Exertional SOB [ Y]; Orthopnea [Y ]; Pedal Edema [ ] ; Palpitations [ ] ; Syncope [ ] ; Presyncope [ ] ; Paroxysmal nocturnal dyspnea[ ]   Pulmonary: Cough [ ] ; Wheezing[ ] ; Hemoptysis[ ] ; Sputum [ ] ; Snoring [ ]   GI: Vomiting[ ] ; Dysphagia[ ] ; Melena[ ] ; Hematochezia [ ] ; Heartburn[ ] ; Abdominal pain [ ] ; Constipation [ ] ; Diarrhea [ ] ; BRBPR [ ]   GU: Hematuria[ ] ; Dysuria [ ] ; Nocturia[ ]   Vascular: Pain in legs with walking [ ] ; Pain in feet with lying flat [ ] ; Non-healing sores [ ] ; Stroke [ ] ; TIA [ ] ; Slurred speech [ ] ;  Neuro: Headaches[ ] ; Vertigo[ ] ; Seizures[ ] ; Paresthesias[ ] ;Blurred vision [ ] ; Diplopia [ ] ; Vision changes [ ]   Ortho/Skin: Arthritis [ ] ; Joint pain [ ] ; Muscle pain [ ] ; Joint swelling [ ] ; Back Pain [ ] ; Rash [ ]   Psych: Depression[ ] ; Anxiety[ ]   Heme: Bleeding problems [ ] ; Clotting disorders [ ] ; Anemia [ ]   Endocrine: Diabetes [ Y]; Thyroid dysfunction[ ]   Home Medications Prior to Admission medications   Medication Sig Start Date End Date Taking? Authorizing Provider  apixaban (ELIQUIS) 5 MG TABS tablet Take 5 mg by mouth 2 (two) times daily.   Yes Historical Provider, MD  aspirin EC 81 MG tablet Take 81 mg by mouth daily.   Yes Historical Provider, MD  co-enzyme Q-10 50 MG capsule Take 50 mg by mouth every morning.    Yes Historical Provider, MD  digoxin (LANOXIN) 0.125 MG tablet Take 1 tablet by mouth every other day. 10/29/14  Yes Historical Provider, MD  enalapril (VASOTEC) 10 MG tablet Take 10 mg by mouth daily. 04/19/15  Yes Historical Provider, MD   fluticasone (FLONASE) 50 MCG/ACT nasal spray Place 1 spray into both nostrils daily as needed for allergies.  11/21/13  Yes Historical Provider, MD  furosemide (LASIX) 40 MG tablet Take 1 tablet (40 mg total) by mouth daily as needed. Patient taking differently: Take 40 mg by mouth daily as needed for fluid or edema.  02/10/15  Yes Arnoldo Lenis, MD  isosorbide dinitrate (ISORDIL) 30 MG tablet Take 15 mg by mouth 2 (two) times daily.  07/31/14  Yes Historical Provider, MD  metoprolol succinate (TOPROL-XL) 25 MG 24 hr tablet Take 50mg  (2 tablets) in am and 25mg  (1 tablet) in pm. 03/10/15  Yes Larey Dresser, MD  nitroGLYCERIN (NITROLINGUAL) 0.4 MG/SPRAY spray Place 1 spray under the tongue every 5 (five) minutes as needed. angina Patient taking differently: Place 1 spray under the tongue every 5 (five) minutes as needed for chest pain. angina 07/19/14  Yes Arnoldo Lenis, MD  pantoprazole (PROTONIX) 40 MG tablet Take 1 tablet (40 mg total) by mouth daily. 07/19/14  Yes Arnoldo Lenis, MD  ranolazine (RANEXA) 500 MG 12 hr tablet Take 1 tablet (500 mg total) by mouth 2 (two) times daily. 02/27/15  Yes Amy D Clegg, NP  glipiZIDE (GLUCOTROL) 5 MG tablet Take 1 tablet (5 mg total) by mouth daily before breakfast. Patient not taking: Reported on 04/17/2015 04/06/15   Corky Sox, MD    Past Medical History: Past Medical History  Diagnosis Date  . Essential hypertension, benign   . CAD (coronary artery disease)     a. BMS to LAD 2001 at [facility] b. PTCA/atherectomy ramus and BMS to LAD 2009  . Chronic systolic heart failure   . GERD (gastroesophageal reflux disease)   . Paroxysmal atrial fibrillation     a. on amiodarone, digoxin and Eliquis  . Adenocarcinoma of rectum     a. 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 Prostate cancer     a. s/p seed implants with chemo and radiation  . Biventricular ICD (implantable cardioverter-defibrillator) in place 2009    upgrade  . TIA (transient ischemic attack)   . OSA on CPAP    . HLD (hyperlipidemia)   . Orthostatic hypotension   . Dizziness     a. chronic. Admission for this 07/18/2014  . History of falling July 2015    due to dizziness from medications  . Ischemic cardiomyopathy     EF 18% by nuclear study 2016, multiple myocardial infarctions in past    . Chronic kidney disease, stage IV (severe) 12/11/2013  . DM type 2, uncontrolled, with renal complications 06/04/5283  . Obesity 12/12/2013  . Hematuria 03/2015    Past Surgical History: Past Surgical History  Procedure Laterality Date  . Internal defibrillator and pacemaker  2002  . Abdominal and perineal resection of rectum with total mesorectal  excision  10/04/2007  . Colonoscopy  09/14/2011    Dr. Gala Romney: via colostomy, Single pedunculated benign inflammatory polyp. Due for surveillance Oct 2015  . Colostomy  2008  . Colonoscopy N/A 07/02/2014    Procedure: COLONOSCOPY;  Surgeon: Daneil Dolin, MD;  [facility];  Service: Endoscopy;  Laterality: N/A;  7:30 / COLONOSCOPY THRU COLOSTOMY  . Esophagogastroduodenoscopy N/A 07/02/2014    Procedure: ESOPHAGOGASTRODUODENOSCOPY (EGD);  Surgeon: Daneil Dolin, MD;  Location: [facility];  Service: Endoscopy;  Laterality: N/A;  7:30  . Savory dilation N/A 07/02/2014    Procedure: SAVORY DILATION;  Surgeon: Daneil Dolin, MD;  Location: [facility];  Service: Endoscopy;  Laterality: N/A;  7:30  . Maloney dilation N/A 07/02/2014    Procedure: Venia Minks DILATION;  Surgeon: Daneil Dolin, MD;  Location: [facility];  Service: Endoscopy;  Laterality: N/A;  7:30  . Portacath placement  06/2007    "removed ~ 1 yr later"  . Cardiac catheterization  08/2001  . Coronary angioplasty with stent placement  2001; ~ 2006    "1 + 1"   . Left heart catheterization with coronary angiogram N/A 07/13/2013    Procedure: LEFT HEART CATHETERIZATION WITH CORONARY ANGIOGRAM;  Surgeon: Lorretta Harp, MD;  Location: [facility] CATH LAB;  Service: Cardiovascular;  Laterality: N/A;  .  Colonoscopy N/A 12/11/2014    Dr. Gala Romney via colostomy. Normal. Repeat in 2021.   Marland Kitchen Esophagogastroduodenoscopy N/A 12/11/2014    [id number] EGD  . Cardiac defibrillator placement  2009    Upgraded to a BiV ICD  . Right heart catheterization N/A 02/24/2015    Procedure: RIGHT HEART CATH;  Surgeon: Jolaine Artist, MD;  Location: [facility] CATH LAB;  Service: Cardiovascular;  Laterality: N/A;  . Ep implantable device N/A 04/10/2015    Procedure: Ppm/Biv Ppm Generator Changeout;  Surgeon: Evans Lance, MD;  Location: [facility] INVASIVE CV LAB CUPID;  Service: Cardiovascular;  Laterality: N/A;    Family History: Family History  Problem Relation Age of Onset  . Colon cancer Mother 24  . Colon cancer Sister 88  . Coronary artery disease Father   . Colon cancer Other     2 cousins, succumbed to illness    Social History: History   Social History  . Marital Status: Married    Spouse Name: N/A  . Number of Children: 1  . Years of Education: N/A   Occupational History  .     Social History Main Topics  . Smoking status: Never Smoker   . Smokeless tobacco: Never Used  . Alcohol Use: No     Comment: Former user 45 years ago  . Drug Use: No  . Sexual Activity: No   Other Topics Concern  . None   Social History Narrative   Married 66 years   1 son, 2 grandkids    Denies alcohol, drugs, tobacco     Allergies:  No Known Allergies  Objective:    Vital Signs:   Pulse Rate:  [76-107] 76 (05/18 1100) Resp:  [17-36] 22 (05/18 1100) BP: (97-133)/(50-85) 107/62 mmHg (05/18 1100) SpO2:  [84 %-100 %] 93 % (05/18 1100) FiO2 (%):  [50 %] 50 % (05/18 0759) Weight:  [198 lb 12.8 oz (90.175 kg)] 198 lb 12.8 oz (90.175 kg) (05/18 0936)    Weight change: Filed Weights   04/23/15 0936  Weight: 198 lb 12.8 oz (90.175 kg)    Intake/Output:   Intake/Output Summary (Last 24  hours) at 04/23/15 1112 Last data filed at 04/23/15 1111  Gross per 24 hour  Intake      0 ml  Output   1400 ml  Net   -1400 ml     Physical Exam: General:  Well appearing. No resp difficulty HEENT: normal Neck: supple. JVP 8-9.  Carotids 2+ bilat; no bruits. No lymphadenopathy or thryomegaly appreciated. Cor: PMI nondisplaced. Regular rate & rhythm. No rubs, gallops or murmurs. Lungs: clear on 2 liters [place]  Abdomen: soft, nontender, nondistended. No hepatosplenomegaly. No bruits or masses. Good bowel sounds. Extremities: no cyanosis, clubbing, rash,  R and LLE 1+ edema Neuro: alert & orientedx3, cranial nerves grossly intact. moves all 4 extremities w/o difficulty. Affect pleasant  Telemetry: NSR 70s   Labs: Basic Metabolic Panel:  Recent Labs Lab 04/23/15 0503  NA 134*  K 4.0  CL 99*  CO2 21*  GLUCOSE 418*  BUN 22*  CREATININE 2.37*  CALCIUM 9.5    Liver Function Tests:  Recent Labs Lab 04/23/15 0503  AST 25  ALT 14*  ALKPHOS 123  BILITOT 1.7*  PROT 7.9  ALBUMIN 3.1*   No results for input(s): LIPASE, AMYLASE in the last 168 hours. No results for input(s): AMMONIA in the last 168 hours.  CBC:  Recent Labs Lab 04/23/15 0503  WBC 11.8*  NEUTROABS 9.5*  HGB 9.3*  HCT 30.0*  MCV 92.6  PLT 229    Cardiac Enzymes:  Recent Labs Lab 04/23/15 0503  TROPONINI 0.04*    BNP: BNP (last 3 results)  Recent Labs  12/24/14 0414 02/23/15 1001 04/23/15 0503  BNP 331.0* 568.0* 1218.9*    ProBNP (last 3 results)  Recent Labs  07/18/14 0555 09/27/14 0523 11/19/14 0745  PROBNP 1116.0* 9164.0* 5233.0*     CBG: No results for input(s): GLUCAP in the last 168 hours.  Coagulation Studies: No results for input(s): LABPROT, INR in the last 72 hours.  Other results: EKG: A fib 106   Imaging: Dg Chest Port 1 View  04/23/2015   CLINICAL DATA:  Chest pain and shortness of breath for 1 day.  EXAM: PORTABLE CHEST - 1 VIEW  COMPARISON:  02/23/2015  FINDINGS: Left-sided pacemaker remains in place. Cardiomegaly is unchanged. There is vascular congestion and perihilar  peribronchial cuffing suggestive of pulmonary edema. No confluent airspace disease to suggest pneumonia. No large pleural effusion or pneumothorax.  IMPRESSION: Vascular congestion and pulmonary edema, findings most consistent with CHF. Cardiomegaly is stable.   Electronically Signed   By: Jeb Levering M.D.   On: 04/23/2015 05:55      Medications:     Current Medications: . apixaban  5 mg Oral BID  . digoxin  125 mcg Oral QODAY  . enalapril  10 mg Oral Daily  . furosemide  60 mg Intravenous BID  . isosorbide dinitrate  15 mg Oral BID  . metoprolol succinate  25 mg Oral Daily     Infusions:      Assessment/Plan    1. Chest Pain - s/p BMS LAD in 2001, PTCA ramus and BMS LAD in 2009. Cardiolite (3/16) with EF 18%, no ischemia, prior anterior, apical and inferior infarction. Resolved. Troponin 0.04>3.72. Had eliquis this am will stop and start heparin tonight. Continue ranexa and isordil.  2. A/C Systolic CHF: Ischemic cardiomyopathy. Has been followed for a long time at [facility]. Deemed not transplant or VAD candidate with comorbities and advanced CKD.  Acutely SOB this am. CXR showed pulmonary edema and vascular  congestion. In the ED he  has received a total of 120 mg IV lasix. Respiratory status has improved.   - BP soft. Hold Toprol XL 25 daily  -Stop Ace for now due to soft BP 3. Atrial fibrillation: Was in NSR in April but today back in A fib.  Has been off amiodarone since April 28 th due to elevated LFTs and concern for amiodarone toxicity. Stop eliquis and start heparin tonight to + troponin.  St Jude to interrogate device. ? Cardioversion ? Tikosyn  Check amiodarone level now. TSH ok  Consult GI ?  Amiodarone toxicity.   4.  CKD: Stable creatinine most recently.Stop Ace.  5. Ventricular fibrillation: ST Jude replaced. 04/16/2015.    6. H/O SAH-  Traumatic, January 2016. Back on anticoagulation.  7. Uncontrolled DM- Glucose. Start sliding scale. per PCP  Length of Stay:  0  CLEGG,AMY NP-C  04/23/2015, 11:12 AM   Advanced Heart Failure Team Pager [phone] (M-F; 7a - 4p)  Please contact [facility] for night-coverage after hours (4p -7a ) and weekends on [email or web address]  Patient seen with NP, agree with the above note.  1. Acute/chronic systolic CHF: Ischemic cardiomyopathy, last EF 25-30%.  Patient was admitted with severe dyspnea, hypoxemia, pulmonary edema on CXR.  He was diuresed with IV Lasix initially and breathing improved significantly.  Still requiring oxygen by nasal cannula but feeling better.  ?Cause of acute exacerbation => it seems relatively sudden.  He was in atrial fibrillation with RVR initially, so could have been related to atrial fibrillation.  Given rise in troponin, also concerned for possible ACS.   - He got IV Lasix initially, breathing better.  Will plan Lasix 60 mg IV bid for now.  - Place PICC, monitor co-ox and CVP.   - BP soft, will hold ACEI.  Will hold Toprol XL with acute pulmonary edema and low BP.  - Continue qod digoxin, level is ok.  2. Atrial fibrillation: Initially in afib/RVR but in the ER, has converted back to a-paced rhythm.  He was taken off amiodarone recently for ?amiodarone toxicity.  He was admitted in 4/16 with elevated LFTs, and CT showed diffuse high attenuation liver density that could be suggestive of a number of disease processes, one of which could be amiodarone liver toxicity. Labs tests were negative for hemochromatosis and Wilson's disease.  - Hold Eliquis and start heparin gtt at an appropriate interval.  - Will have GI see patient and comment whether this does seem to be amiodarone liver toxicity or if he can be re-challenged with amiodarone.  - If no amiodarone, I do not think we have much option for antiarrhythmics beyond ranolazine.  Tikosyn will be difficult with his renal function and would have to stop ranolazine.  If he continues to have a lot of breakthrough afib/RVR and we cannot use amiodarone, AV  nodal ablation may be best option.  - St Jude to interrogate for atrial fibrillation burden.  3. CAD: NSTEMI with rise in troponin to 3.72.  He initially had severe dyspnea but later had some chest pain.  He is currently chest pain-free.  Cannot totally rule out demand ischemia in setting of hypoxemia/CHF, but troponin is significantly elevated.   - Statin appears to have been stopped when he was in the hospital with elevated LFTs.  I will restart Crestor.  - ASA 81, start heparin gtt at an appropriate interval after last Eliquis dose.   - Cycle troponin to peak.  Cardiac cath will  be difficult => he has CKD III with baseline creatinine around where creatinine is currently, 2.37. May need RHC/LHC this admission, will follow.  4. CKD: Creatinine is at baseline.   Loralie Champagne 04/23/2015 12:46 PM  ICD interrogated: He has actually not had any significant atrial fibrillation.  ?Earlier ECG was a-paced with PACs.  He is comfortable now, no dyspnea or chest pain.  I am concerned that this may have been a primary ischemic event.  - PICC in, will follow CVPs => CVP 6, hold Lasix for now and reassess volume in am.   - Trend troponin to peak.   - Will follow creatinine closely over the next day or so and make decision on coronary angiography.  Creatinine 2.37 today.   Loralie Champagne 04/23/2015 4:29 PM

## 2015-04-23 NOTE — ED Notes (Addendum)
Pt removed from bipap by respiratory. Placed on 2.5 L nasal cannula. Pt weighed at this time, on exertion O2 sats dropped to 86% on 2L nasal cannula. When placed back in bed sats back up to 92%.

## 2015-04-23 NOTE — ED Notes (Signed)
Critical troponin received from lab 3.72- EDP aware.

## 2015-04-23 NOTE — Progress Notes (Signed)
[facility]  Patient Status: Active (receiving services up to time of hospitalization)  [facility] is providing the following services: RN  If patient discharges after hours, please call [phone].   [name] 04/23/2015, 3:34 PM

## 2015-04-23 NOTE — Care Management Note (Signed)
Case Management Note  Patient Details  Name: [name] MRN: [id number] Date of Birth: Apr 27, 1956  Subjective/Objective:                    Action/Plan:lives w wife, act w [facility], pcp dr Education officer, community [facility]   Expected Discharge Date:                  Expected Discharge Plan:  [place]  In-House Referral:     Discharge planning Services  CM Consult  Post Acute Care Choice:  Resumption of Svcs/PTA Provider Choice offered to:     DME Arranged:    DME Agency:     HH Arranged:  RN [place] Agency:  [place]  Status of Service:     Medicare Important Message Given:    Date Medicare IM Given:    Medicare IM give by:    Date Additional Medicare IM Given:    Additional Medicare Important Message give by:     If discussed at [place] of Stay Meetings, dates discussed:    Additional Comments:ur review done, alerted donna w [facility] of adm.  Lacretia Leigh, RN 04/23/2015, 2:17 PM

## 2015-04-23 NOTE — ED Provider Notes (Addendum)
CSN: [id number]     Arrival date & time 04/23/15  0449 History   None    Chief Complaint  Patient presents with  . Shortness of Breath  . Chest Pain   Level V caveat for respiratory distress  (Consider location/radiation/quality/duration/timing/severity/associated sxs/prior Treatment) HPI  Patient has a history of congestive heart failure, cardiac artery disease, chronic renal disease presents stating he noted some abdominal tightness yesterday. He was not noticing any swelling of his extremities. He states tonight about 2 AM he got acutely short of breath and had some chest discomfort. He states his chest pain is gone at this time. Patient states he does not have oxygen to use at home. He states he's had this before with flareups of his congestive heart failure.   PCP Dr Gerarda Fraction Cardiology Dr Sung Amabile  Past Medical History  Diagnosis Date  . Essential hypertension, benign   . CAD (coronary artery disease)     a. BMS to LAD 2001 at [facility] b. PTCA/atherectomy ramus and BMS to LAD 2009  . Chronic systolic heart failure   . GERD (gastroesophageal reflux disease)   . Paroxysmal atrial fibrillation     a. on amiodarone, digoxin and Eliquis  . Adenocarcinoma of rectum     a. 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 Prostate cancer     a. s/p seed implants with chemo and radiation  . Biventricular ICD (implantable cardioverter-defibrillator) in place 2009    upgrade  . TIA (transient ischemic attack)   . OSA on CPAP   . HLD (hyperlipidemia)   . Orthostatic hypotension   . Dizziness     a. chronic. Admission for this 07/18/2014  . History of falling July 2015    due to dizziness from medications  . Ischemic cardiomyopathy     EF 18% by nuclear study 2016, multiple myocardial infarctions in past    . Chronic kidney disease, stage IV (severe) 12/11/2013  . DM type 2, uncontrolled, with renal complications 1/82/9937  . Obesity 12/12/2013  . Hematuria 03/2015   Past Surgical History  Procedure Laterality Date   . Internal defibrillator and pacemaker  2002  . Abdominal and perineal resection of rectum with total mesorectal excision  10/04/2007  . Colonoscopy  09/14/2011    Dr. Gala Romney: via colostomy, Single pedunculated benign inflammatory polyp. Due for surveillance Oct 2015  . Colostomy  2008  . Colonoscopy N/A 07/02/2014    Procedure: COLONOSCOPY;  Surgeon: Daneil Dolin, MD;  Location: [facility];  Service: Endoscopy;  Laterality: N/A;  7:30 / COLONOSCOPY THRU COLOSTOMY  . Esophagogastroduodenoscopy N/A 07/02/2014    Procedure: ESOPHAGOGASTRODUODENOSCOPY (EGD);  Surgeon: Daneil Dolin, MD;  Location: [facility];  Service: Endoscopy;  Laterality: N/A;  7:30  . Savory dilation N/A 07/02/2014    Procedure: SAVORY DILATION;  Surgeon: Daneil Dolin, MD;  Location: [facility];  Service: Endoscopy;  Laterality: N/A;  7:30  . Maloney dilation N/A 07/02/2014    Procedure: Venia Minks DILATION;  Surgeon: Daneil Dolin, MD;  Location: [facility];  Service: Endoscopy;  Laterality: N/A;  7:30  . Portacath placement  06/2007    "removed ~ 1 yr later"  . Cardiac catheterization  08/2001  . Coronary angioplasty with stent placement  2001; ~ 2006    "1 + 1"   . Left heart catheterization with coronary angiogram N/A 07/13/2013    Procedure: LEFT HEART CATHETERIZATION WITH CORONARY ANGIOGRAM;  Surgeon: Lorretta Harp, MD;  Location[facility] CATH LAB;  Service: Cardiovascular;  Laterality: N/A;  . Colonoscopy N/A 12/11/2014    Dr. Gala Romney via colostomy. Normal. Repeat in 2021.   Marland Kitchen Esophagogastroduodenoscopy N/A 12/11/2014    [id number] EGD  . Cardiac defibrillator placement  2009    Upgraded to a BiV ICD  . Right heart catheterization N/A 02/24/2015    Procedure: RIGHT HEART CATH;  Surgeon: Jolaine Artist, MD;  [facility] LAB;  Service: Cardiovascular;  Laterality: N/A;  . Ep implantable device N/A 04/10/2015    Procedure: Ppm/Biv Ppm Generator Changeout;  Surgeon: Evans Lance, MD;  Location: [place] INVASIVE  CV LAB CUPID;  Service: Cardiovascular;  Laterality: N/A;   Family History  Problem Relation Age of Onset  . Colon cancer Mother 55  . Colon cancer Sister 41  . Coronary artery disease Father   . Colon cancer Other     2 cousins, succumbed to illness   History  Substance Use Topics  . Smoking status: Never Smoker   . Smokeless tobacco: Never Used  . Alcohol Use: No     Comment: Former user 45 years ago  uses CPAP at night Lives with spouse No home oxygen  Review of Systems  Unable to perform ROS: Severe respiratory distress      Allergies  Review of patient's allergies indicates no known allergies.  Home Medications   Prior to Admission medications   Medication Sig Start Date End Date Taking? Authorizing Provider  apixaban (ELIQUIS) 5 MG TABS tablet Take 5 mg by mouth 2 (two) times daily.   Yes Historical Provider, MD  aspirin EC 81 MG tablet Take 81 mg by mouth daily.   Yes Historical Provider, MD  co-enzyme Q-10 50 MG capsule Take 50 mg by mouth every morning.    Yes Historical Provider, MD  digoxin (LANOXIN) 0.125 MG tablet Take 1 tablet by mouth every other day. 10/29/14  Yes Historical Provider, MD  enalapril (VASOTEC) 10 MG tablet Take 10 mg by mouth daily. 04/19/15  Yes Historical Provider, MD  fluticasone (FLONASE) 50 MCG/ACT nasal spray Place 1 spray into both nostrils daily as needed for allergies.  11/21/13  Yes Historical Provider, MD  furosemide (LASIX) 40 MG tablet Take 1 tablet (40 mg total) by mouth daily as needed. Patient taking differently: Take 40 mg by mouth daily as needed for fluid or edema.  02/10/15  Yes Arnoldo Lenis, MD  isosorbide dinitrate (ISORDIL) 30 MG tablet Take 15 mg by mouth 2 (two) times daily.  07/31/14  Yes Historical Provider, MD  metoprolol succinate (TOPROL-XL) 25 MG 24 hr tablet Take 50mg  (2 tablets) in am and 25mg  (1 tablet) in pm. 03/10/15  Yes Larey Dresser, MD  nitroGLYCERIN (NITROLINGUAL) 0.4 MG/SPRAY spray Place 1 spray under  the tongue every 5 (five) minutes as needed. angina Patient taking differently: Place 1 spray under the tongue every 5 (five) minutes as needed for chest pain. angina 07/19/14  Yes Arnoldo Lenis, MD  pantoprazole (PROTONIX) 40 MG tablet Take 1 tablet (40 mg total) by mouth daily. 07/19/14  Yes Arnoldo Lenis, MD  ranolazine (RANEXA) 500 MG 12 hr tablet Take 1 tablet (500 mg total) by mouth 2 (two) times daily. 02/27/15  Yes Amy D Clegg, NP  glipiZIDE (GLUCOTROL) 5 MG tablet Take 1 tablet (5 mg total) by mouth daily before breakfast. Patient not taking: Reported on 04/17/2015 04/06/15   Corky Sox, MD   BP 111/70 mmHg  Pulse 104  Resp 24  SpO2 100% on NRM was 75% on RA on arrival to the ED  Vital signs normal except for tachycardia and hypoxia   Physical Exam  Constitutional: He is oriented to person, place, and time. He appears well-developed and well-nourished.  Non-toxic appearance. He does not appear ill. No distress.  HENT:  Head: Normocephalic and atraumatic.  Right Ear: External ear normal.  Left Ear: External ear normal.  Nose: Nose normal. No mucosal edema or rhinorrhea.  Mouth/Throat: Oropharynx is clear and moist and mucous membranes are normal. No dental abscesses or uvula swelling.  Eyes: Conjunctivae and EOM are normal. Pupils are equal, round, and reactive to light.  Neck: Normal range of motion and full passive range of motion without pain. Neck supple.  Cardiovascular: Normal heart sounds.  An irregularly irregular rhythm present. Tachycardia present.  Exam reveals no gallop and no friction rub.   No murmur heard. Pulmonary/Chest: Accessory muscle usage present. Tachypnea noted. He is in respiratory distress. He has decreased breath sounds. He has no wheezes. He has no rhonchi. He has rales. He exhibits no tenderness and no crepitus.  Abdominal: Soft. Normal appearance and bowel sounds are normal. He exhibits no distension. There is no tenderness. There is no rebound  and no guarding.  Musculoskeletal: Normal range of motion. He exhibits no edema or tenderness.  Moves all extremities well.   Neurological: He is alert and oriented to person, place, and time. He has normal strength. No cranial nerve deficit.  Skin: Skin is warm, dry and intact. No rash noted. No erythema. There is pallor.  Psychiatric: He has a normal mood and affect. His speech is normal and behavior is normal. His mood appears not anxious.  Nursing note and vitals reviewed.   ED Course  Procedures (including critical care time)  Medications  furosemide (LASIX) injection 60 mg (60 mg Intravenous Given 04/23/15 0520)  nitroGLYCERIN 50 mg in dextrose 5 % 250 mL (0.2 mg/mL) infusion (15 mcg/min Intravenous Rate/Dose Change 04/23/15 0610)  ondansetron (ZOFRAN) injection 4 mg (4 mg Intravenous Given 04/23/15 0553)   Patient was placed on BiPAP and started on low-dose nitroglycerin drip. He was given Lasix IV. VBG shows patient does not have hypercarbic respiratory failure. He tolerated the BiPAP well. He did have one episode where he got nauseated and was given Zofran.  Review of patient's record shows he was discharged on May 1 when he was admitted to the hospital by the teaching service.  Pt's St Jude pacemaker was interrogated and he had no arrhythmia or unusual events  06:52 PA Cecilie Kicks, Cardiology, will have Dr Acie Fredrickson call me back.  07:24 Dr Acie Fredrickson, wants medicine to admit  07:31 Mathews Argyle, [facility] will admit patient as unassigned admission.   Labs Review Results for orders placed or performed during the hospital encounter of 04/23/15  Comprehensive metabolic panel  Result Value Ref Range   Sodium 134 (L) 135 - 145 mmol/L   Potassium 4.0 3.5 - 5.1 mmol/L   Chloride 99 (L) 101 - 111 mmol/L   CO2 21 (L) 22 - 32 mmol/L   Glucose, Bld 418 (H) 65 - 99 mg/dL   BUN 22 (H) 6 - 20 mg/dL   Creatinine, Ser 2.37 (H) 0.61 - 1.24 mg/dL   Calcium 9.5 8.9 - 10.3 mg/dL   Total Protein 7.9  6.5 - 8.1 g/dL   Albumin 3.1 (L) 3.5 - 5.0 g/dL   AST 25 15 - 41 U/L   ALT 14 (L) 17 -  63 U/L   Alkaline Phosphatase 123 38 - 126 U/L   Total Bilirubin 1.7 (H) 0.3 - 1.2 mg/dL   GFR calc non Af Amer 28 (L) >60 mL/min   GFR calc Af Amer 33 (L) >60 mL/min   Anion gap 14 5 - 15  CBC with Differential  Result Value Ref Range   WBC 11.8 (H) 4.0 - 10.5 K/uL   RBC 3.24 (L) 4.22 - 5.81 MIL/uL   Hemoglobin 9.3 (L) 13.0 - 17.0 g/dL   HCT 30.0 (L) 39.0 - 52.0 %   MCV 92.6 78.0 - 100.0 fL   MCH 28.7 26.0 - 34.0 pg   MCHC 31.0 30.0 - 36.0 g/dL   RDW 15.8 (H) 11.5 - 15.5 %   Platelets 229 150 - 400 K/uL   Neutrophils Relative % 80 (H) 43 - 77 %   Neutro Abs 9.5 (H) 1.7 - 7.7 K/uL   Lymphocytes Relative 12 12 - 46 %   Lymphs Abs 1.4 0.7 - 4.0 K/uL   Monocytes Relative 5 3 - 12 %   Monocytes Absolute 0.6 0.1 - 1.0 K/uL   Eosinophils Relative 3 0 - 5 %   Eosinophils Absolute 0.3 0.0 - 0.7 K/uL   Basophils Relative 0 0 - 1 %   Basophils Absolute 0.0 0.0 - 0.1 K/uL  Troponin I  Result Value Ref Range   Troponin I 0.04 (H) <0.031 ng/mL  Brain natriuretic peptide  Result Value Ref Range   B Natriuretic Peptide 1218.9 (H) 0.0 - 100.0 pg/mL  Digoxin level  Result Value Ref Range   Digoxin Level 0.6 (L) 0.8 - 2.0 ng/mL  I-Stat venous blood gas, ED  Result Value Ref Range   pH, Ven 7.381 (H) 7.250 - 7.300   pCO2, Ven 33.9 (L) 45.0 - 50.0 mmHg   pO2, Ven 17.0 (LL) 30.0 - 45.0 mmHg   Bicarbonate 20.1 20.0 - 24.0 mEq/L   TCO2 21 0 - 100 mmol/L   O2 Saturation 24.0 %   Acid-base deficit 4.0 (H) 0.0 - 2.0 mmol/L   Patient temperature 37.0 C    Sample type VENOUS    Comment NOTIFIED PHYSICIAN     Laboratory interpretation all normal except hyperglycemia, his BNP is about 3-4 times his normal range, renal disease, stable anemia     Imaging Review Dg Chest Port 1 View  04/23/2015   CLINICAL DATA:  Chest pain and shortness of breath for 1 day.  EXAM: PORTABLE CHEST - 1 VIEW  COMPARISON:   02/23/2015  FINDINGS: Left-sided pacemaker remains in place. Cardiomegaly is unchanged. There is vascular congestion and perihilar peribronchial cuffing suggestive of pulmonary edema. No confluent airspace disease to suggest pneumonia. No large pleural effusion or pneumothorax.  IMPRESSION: Vascular congestion and pulmonary edema, findings most consistent with CHF. Cardiomegaly is stable.   Electronically Signed   By: Jeb Levering M.D.   On: 04/23/2015 05:55     Ct Abdomen Pelvis Wo Contrast  04/05/2015   CLINICAL DATA:  LFTs improved. No significant complaints today. Urine is still dark in color. Passed clots. History of colostomy.  .  IMPRESSION: 1. Diffusely high attenuation liver density without focal lesion. Findings are suspicious for heavy metal deposition. Considerations include hemochromatosis, hemosiderosis, copper deposition from [place] disease. Glycogen storage disease, amiodarone toxicity and gold therapy can also cause this appearance. 2. Left mid abdominal colostomy is unremarkable in appearance. 3. 2.7 cm nodule in the right perirectal region, suspicious for recurrent cancer.  Electronically Signed   By: Nolon Nations M.D.   On: 04/05/2015 18:01   Nm Hepatobiliary Liver Func  04/04/2015   CLINICAL DATA:  Chronic cholecystitisIMPRESSION: 1. Patent cystic duct with delayed gallbladder filling. 2. Patent common bile duct.   Electronically Signed   By: Monte Fantasia M.D.   On: 04/04/2015 16:18   US Abdomen Complete  04/03/2015   CLINICAL DATA:  Abdominal pain, stage IV renal disease.    IMPRESSION: 1. Contracted gallbladder with wall thickening and gallstones suggests chronic cholecystitis. Negative sonographic Murphy's sign. 2. Common bile duct upper limits of normal. 3. Mild splenomegaly. 4. Small right pleural effusion.   Electronically Signed   By: Suzy Bouchard M.D.   On: 04/03/2015 21:09    EKG Interpretation   Date/Time:  Wednesday Apr 23 2015 04:55:45 EDT Ventricular  Rate:  106 PR Interval:    QRS Duration: 178 QT Interval:  462 QTC Calculation: 614 R Axis:   -158 Text Interpretation:  Atrial fibrillation paced rhythm Borderline ST  depression, inferior leads Confirmed by Demetri Goshert  MD-I, Terryl Niziolek ([id number]) on  04/23/2015 5:11:13 AM      MDM   patient presents with acute shortness of breath with severe hypoxia and was placed on BiPAP for acute exacerbation of congestive heart failure complicated by chronic renal disease. He is being admitted by medicine and will have cardiology consult.    Final diagnoses:  Acute systolic congestive heart failure  Hypoxia  Renal failure    Plan admission   Rolland Porter, MD, FACEP    CRITICAL CARE Performed by: Rolland Porter L Total critical care time: 34 min Critical care time was exclusive of separately billable procedures and treating other patients. Critical care was necessary to treat or prevent imminent or life-threatening deterioration. Critical care was time spent personally by me on the following activities: development of treatment plan with patient and/or surrogate as well as nursing, discussions with consultants, evaluation of patient's response to treatment, examination of patient, obtaining history from patient or surrogate, ordering and performing treatments and interventions, ordering and review of laboratory studies, ordering and review of radiographic studies, pulse oximetry and re-evaluation of patient's condition.     Rolland Porter, MD 04/23/15 [id number]  Rolland Porter, MD 04/23/15 [phone]

## 2015-04-23 NOTE — ED Notes (Signed)
Per admitting MD, d/c nitro drip.

## 2015-04-23 NOTE — ED Notes (Signed)
Patient here from home with complaint of shortness of breath and chest pain beginning this AM around 0200. States history of similar episodes. Hx reported of CHF. Has pacemaker, [employer].

## 2015-04-24 ENCOUNTER — Inpatient Hospital Stay (HOSPITAL_COMMUNITY): Payer: Medicare Other

## 2015-04-24 DIAGNOSIS — D696 Thrombocytopenia, unspecified: Secondary | ICD-10-CM

## 2015-04-24 DIAGNOSIS — I509 Heart failure, unspecified: Secondary | ICD-10-CM

## 2015-04-24 DIAGNOSIS — D500 Iron deficiency anemia secondary to blood loss (chronic): Secondary | ICD-10-CM

## 2015-04-24 DIAGNOSIS — R7989 Other specified abnormal findings of blood chemistry: Secondary | ICD-10-CM

## 2015-04-24 LAB — CBC WITH DIFFERENTIAL/PLATELET
Basophils Absolute: 0 10*3/uL (ref 0.0–0.1)
Basophils Absolute: 0 10*3/uL (ref 0.0–0.1)
Basophils Relative: 0 % (ref 0–1)
Basophils Relative: 0 % (ref 0–1)
EOS ABS: 0.2 10*3/uL (ref 0.0–0.7)
EOS PCT: 3 % (ref 0–5)
Eosinophils Absolute: 0.2 10*3/uL (ref 0.0–0.7)
Eosinophils Relative: 3 % (ref 0–5)
HCT: 22.5 % — ABNORMAL LOW (ref 39.0–52.0)
HEMATOCRIT: 22.2 % — AB (ref 39.0–52.0)
HEMOGLOBIN: 7.3 g/dL — AB (ref 13.0–17.0)
Hemoglobin: 7.1 g/dL — ABNORMAL LOW (ref 13.0–17.0)
LYMPHS ABS: 1.1 10*3/uL (ref 0.7–4.0)
LYMPHS PCT: 13 % (ref 12–46)
LYMPHS PCT: 15 % (ref 12–46)
Lymphs Abs: 1.2 10*3/uL (ref 0.7–4.0)
MCH: 28.9 pg (ref 26.0–34.0)
MCH: 30.2 pg (ref 26.0–34.0)
MCHC: 31.6 g/dL (ref 30.0–36.0)
MCHC: 32.9 g/dL (ref 30.0–36.0)
MCV: 91.5 fL (ref 78.0–100.0)
MCV: 91.7 fL (ref 78.0–100.0)
MONO ABS: 0.6 10*3/uL (ref 0.1–1.0)
MONOS PCT: 8 % (ref 3–12)
MONOS PCT: 7 % (ref 3–12)
Monocytes Absolute: 0.6 10*3/uL (ref 0.1–1.0)
NEUTROS ABS: 6.1 10*3/uL (ref 1.7–7.7)
NEUTROS ABS: 6.3 10*3/uL (ref 1.7–7.7)
Neutrophils Relative %: 76 % (ref 43–77)
Neutrophils Relative %: 75 % (ref 43–77)
Platelets: 134 10*3/uL — ABNORMAL LOW (ref 150–400)
Platelets: 126 10*3/uL — ABNORMAL LOW (ref 150–400)
RBC: 2.46 MIL/uL — ABNORMAL LOW (ref 4.22–5.81)
RBC: 2.42 MIL/uL — ABNORMAL LOW (ref 4.22–5.81)
RDW: 15.7 % — AB (ref 11.5–15.5)
RDW: 15.8 % — ABNORMAL HIGH (ref 11.5–15.5)
WBC: 8 10*3/uL (ref 4.0–10.5)
WBC: 8.4 10*3/uL (ref 4.0–10.5)

## 2015-04-24 LAB — BASIC METABOLIC PANEL
Anion gap: 9 (ref 5–15)
BUN: 27 mg/dL — ABNORMAL HIGH (ref 6–20)
CALCIUM: 8.3 mg/dL — AB (ref 8.9–10.3)
CO2: 27 mmol/L (ref 22–32)
Chloride: 99 mmol/L — ABNORMAL LOW (ref 101–111)
Creatinine, Ser: 2.39 mg/dL — ABNORMAL HIGH (ref 0.61–1.24)
GFR calc Af Amer: 32 mL/min — ABNORMAL LOW (ref >60)
GFR, EST NON AFRICAN AMERICAN: 28 mL/min — AB (ref >60)
GLUCOSE: 222 mg/dL — AB (ref 65–99)
Potassium: 3.7 mmol/L (ref 3.5–5.1)
SODIUM: 135 mmol/L (ref 135–145)

## 2015-04-24 LAB — IRON AND TIBC
Iron: 17 ug/dL — ABNORMAL LOW (ref 45–182)
SATURATION RATIOS: 5 % — AB (ref 17.9–39.5)
TIBC: 340 ug/dL (ref 250–450)
UIBC: 323 ug/dL

## 2015-04-24 LAB — VITAMIN B12: Vitamin B-12: 221 pg/mL (ref 180–914)

## 2015-04-24 LAB — CARBOXYHEMOGLOBIN
CARBOXYHEMOGLOBIN: 3 % — AB (ref 0.5–1.5)
Methemoglobin: 0.9 % (ref 0.0–1.5)
O2 Saturation: 71.6 %
Total hemoglobin: 7.1 g/dL — ABNORMAL LOW (ref 13.5–18.0)

## 2015-04-24 LAB — TROPONIN I
TROPONIN I: 2.13 ng/mL — AB (ref <0.031)
Troponin I: 3.11 ng/mL (ref <0.031)

## 2015-04-24 LAB — PREPARE RBC (CROSSMATCH)

## 2015-04-24 LAB — HEPARIN LEVEL (UNFRACTIONATED): Heparin Unfractionated: >2.2 IU/mL — ABNORMAL HIGH (ref 0.30–0.70)

## 2015-04-24 LAB — GLUCOSE, CAPILLARY
GLUCOSE-CAPILLARY: 222 mg/dL — AB (ref 65–99)
GLUCOSE-CAPILLARY: 229 mg/dL — AB (ref 65–99)
Glucose-Capillary: 174 mg/dL — ABNORMAL HIGH (ref 65–99)
Glucose-Capillary: 301 mg/dL — ABNORMAL HIGH (ref 65–99)

## 2015-04-24 LAB — MAGNESIUM: Magnesium: 1.7 mg/dL (ref 1.7–2.4)

## 2015-04-24 LAB — APTT: aPTT: 63 seconds — ABNORMAL HIGH (ref 24–37)

## 2015-04-24 LAB — FERRITIN: Ferritin: 228 ng/mL (ref 24–336)

## 2015-04-24 MED ORDER — POTASSIUM CHLORIDE CRYS ER 20 MEQ PO TBCR
40.0000 meq | EXTENDED_RELEASE_TABLET | Freq: Once | ORAL | Status: DC
  Filled 2015-04-24: qty 2

## 2015-04-24 MED ORDER — SODIUM CHLORIDE 0.9 % IV SOLN
Freq: Once | INTRAVENOUS | Status: AC
  Administered 2015-04-24: 10:00:00 via INTRAVENOUS

## 2015-04-24 MED ORDER — FUROSEMIDE 10 MG/ML IJ SOLN
60.0000 mg | Freq: Two times a day (BID) | INTRAMUSCULAR | Status: DC
  Administered 2015-04-24 (×2): 60 mg via INTRAVENOUS
  Filled 2015-04-24 (×4): qty 6

## 2015-04-24 MED ORDER — ENSURE ENLIVE PO LIQD
237.0000 mL | Freq: Two times a day (BID) | ORAL | Status: DC
  Administered 2015-04-25 (×2): 237 mL via ORAL

## 2015-04-24 MED ORDER — PERFLUTREN LIPID MICROSPHERE
1.0000 mL | INTRAVENOUS | Status: AC | PRN
  Administered 2015-04-24: 4 mL via INTRAVENOUS
  Filled 2015-04-24: qty 10

## 2015-04-24 MED ORDER — POTASSIUM CHLORIDE CRYS ER 20 MEQ PO TBCR
40.0000 meq | EXTENDED_RELEASE_TABLET | Freq: Once | ORAL | Status: AC
  Administered 2015-04-24: 40 meq via ORAL

## 2015-04-24 MED ORDER — PERFLUTREN LIPID MICROSPHERE
INTRAVENOUS | Status: AC
  Administered 2015-04-24: 4 mL via INTRAVENOUS
  Filled 2015-04-24: qty 10

## 2015-04-24 NOTE — Progress Notes (Signed)
Spoke with Dr. Marigene Ehlers regarding patient's soft BP. The patient is arousable, A&Ox4, no complaints of dizziness or other symptoms, denies cp. No new orders given at this time. Will continue to monitor patient.

## 2015-04-24 NOTE — Progress Notes (Signed)
Patient ID: [name], male   DOB: 18-Jan-1956, 59 y.o.   MRN: [id number]   SUBJECTIVE:  Patient diuresed well yesterday, he is no longer short of breath.  No chest pain.  Feels fine but blood pressure has been soft with SBP 80s-90s.  Hemoglobin and plts both significantly lower this morning, repeat CBC confirms.  No overt bleeding.  Troponin with a relatively stable elevation.  CVP 8 today with co-ox 72%.   Scheduled Meds: . sodium chloride   Intravenous Once  . aspirin EC  81 mg Oral Daily  . digoxin  125 mcg Oral QODAY  . insulin aspart  0-15 Units Subcutaneous TID WC  . insulin aspart  0-5 Units Subcutaneous QHS  . isosorbide dinitrate  15 mg Oral BID  . pantoprazole  40 mg Oral Daily  . ranolazine  500 mg Oral BID  . rosuvastatin  40 mg Oral q1800  . sodium chloride  10-40 mL Intracatheter Q12H  . sodium chloride  3 mL Intravenous Q12H   Continuous Infusions:  PRN Meds:.fluticasone, nitroGLYCERIN, promethazine, sodium chloride    Filed Vitals:   04/24/15 0500 04/24/15 0600 04/24/15 0630 04/24/15 0700  BP: 79/36 77/37 81/43  86/43  Pulse: 72 70 77 76  Temp:      TempSrc:      Resp: 16 19 19 19   Height:      Weight:      SpO2: 95% 100% 96% 97%    Intake/Output Summary (Last 24 hours) at 04/24/15 0712 Last data filed at 04/24/15 0600  Gross per 24 hour  Intake 456.32 ml  Output   2725 ml  Net -2268.68 ml    LABS: Basic Metabolic Panel:  Recent Labs  04/23/15 0503 04/24/15 0420  NA 134* 135  K 4.0 3.7  CL 99* 99*  CO2 21* 27  GLUCOSE 418* 222*  BUN 22* 27*  CREATININE 2.37* 2.39*  CALCIUM 9.5 8.3*  MG 1.9 1.7   Liver Function Tests:  Recent Labs  04/23/15 0503  AST 25  ALT 14*  ALKPHOS 123  BILITOT 1.7*  PROT 7.9  ALBUMIN 3.1*   No results for input(s): LIPASE, AMYLASE in the last 72 hours. CBC:  Recent Labs  04/24/15 0420 04/24/15 0600  WBC 8.0 8.4  NEUTROABS 6.1 6.3  HGB 7.1* 7.3*  HCT 22.5* 22.2*  MCV 91.5 91.7  PLT 134* 126*     Cardiac Enzymes:  Recent Labs  04/23/15 1100 04/23/15 1800 04/23/15 2330  TROPONINI 3.72* 4.29* 3.11*   BNP: Invalid input(s): POCBNP D-Dimer: No results for input(s): DDIMER in the last 72 hours. Hemoglobin A1C: No results for input(s): HGBA1C in the last 72 hours. Fasting Lipid Panel: No results for input(s): CHOL, HDL, LDLCALC, TRIG, CHOLHDL, LDLDIRECT in the last 72 hours. Thyroid Function Tests: No results for input(s): TSH, T4TOTAL, T3FREE, THYROIDAB in the last 72 hours.  Invalid input(s): FREET3 Anemia Panel: No results for input(s): VITAMINB12, FOLATE, FERRITIN, TIBC, IRON, RETICCTPCT in the last 72 hours.  RADIOLOGY: Ct Abdomen Pelvis Wo Contrast  04/05/2015   CLINICAL DATA:  LFTs improved. No significant complaints today. Urine is still dark in color. Passed clots. History of colostomy.  EXAM: CT ABDOMEN AND PELVIS WITHOUT CONTRAST  TECHNIQUE: Multidetector CT imaging of the abdomen and pelvis was performed following the standard protocol without IV contrast.  COMPARISON:  02/07/2015  FINDINGS: Lower chest: Pacing leads and coronary stents. Small bilateral pleural effusions associated with bibasilar atelectasis. No pulmonary nodules.  Upper  abdomen: The noncontrast appearance of the liver is high attenuation. Liver is normal in size. No focal lesions are identified. Gallbladder is present a contains probable small calcified gallstone. The spleen is normal in appearance. No focal abnormality identified within the pancreas, adrenal glands, or kidneys.  Gastrointestinal tract: The stomach and small bowel loops are normal in appearance. Left mid abdominal colostomy. There is soft tissue in the region of the rectum possibly representing a rectal stump and unchanged. No evidence for bowel obstruction. The appendix is well seen and has a normal appearance.  Pelvis: There is no free pelvic fluid. Seeds versus clips within the prostate gland. Urinary bladder is distended. Adjacent to  the rectum in the region of the ischiorectal fossa there is a small soft tissue nodule measuring 2.3 x 2.6 x 2.7 cm. Findings are suspicious for recurrence of rectal cancer.  Retroperitoneum: No retroperitoneal or mesenteric adenopathy. No evidence for aortic aneurysm.  Abdominal wall: Left mid abdominal ostomy site is unremarkable. There is mild dependent body wall edema.  Osseous structures: Lower thoracic and lumbar spondylosis. No suspicious lytic or blastic lesions are identified.  IMPRESSION: 1. Diffusely high attenuation liver density without focal lesion. Findings are suspicious for heavy metal deposition. 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 Glycogen storage disease, amiodarone toxicity and gold therapy can also cause this appearance. 2. Left mid abdominal colostomy is unremarkable in appearance. 3. 2.7 cm nodule in the right perirectal region, suspicious for recurrent cancer.   Electronically Signed   By: Nolon Nations M.D.   On: 04/05/2015 18:01   Nm Hepatobiliary Liver Func  04/04/2015   CLINICAL DATA:  Chronic cholecystitis  EXAM: NUCLEAR MEDICINE HEPATOBILIARY IMAGING  TECHNIQUE: Sequential images of the abdomen were obtained out to 60 minutes following intravenous administration of radiopharmaceutical.  RADIOPHARMACEUTICALS:  5.3 mCi Millicurie RK-27C Choletec  COMPARISON:  None.  FINDINGS: Mild delay in clearance of blood pool. Uptake in the liver is homogeneous and adequate. The central biliary tree and bowel is seen by 10 minutes. The gallbladder was not visualized at 1 hour and 3.6 mg of morphine was given intravenously. The gallbladder subsequently filled.  IMPRESSION: 1. Patent cystic duct with delayed gallbladder filling. 2. Patent common bile duct.   Electronically Signed   By: Monte Fantasia M.D.   On: 04/04/2015 16:18   US Abdomen Complete  04/03/2015   CLINICAL DATA:  Abdominal pain, stage IV renal disease.  EXAM: ULTRASOUND  ABDOMEN COMPLETE  COMPARISON:  CT 02/07/2015  FINDINGS: Gallbladder: Gallbladder is contracted and thick-walled. Several gallstones are noted measuring up to 5 mm. There some debris within the gallbladder. Negative sonographic Murphy's sign.  Common bile duct: Diameter: Upper limits of normal at 6 mm.  Liver: No focal lesion identified. Within normal limits in parenchymal echogenicity.  IVC: Within normal limits  Pancreas: Visualized portion unremarkable.  Spleen: Mildly enlarged spleen with a calculated volume of 657 cubic cm.  Right Kidney: Length: 11.2 cm. Echogenicity within normal limits. No mass or hydronephrosis visualized.  Left Kidney: Length: 11.4 cm. Echogenicity within normal limits. No mass or hydronephrosis visualized.  Abdominal aorta: No aneurysm visualized.  Other findings: Small right pleural effusion.  IMPRESSION: 1. Contracted gallbladder with wall thickening and gallstones suggests chronic cholecystitis. Negative sonographic Murphy's sign. 2. Common bile duct upper limits of normal. 3. Mild splenomegaly. 4. Small right pleural effusion.   Electronically Signed   By: Suzy Bouchard M.D.   On: 04/03/2015 21:09   Dg Chest  Port 1 View  04/23/2015   CLINICAL DATA:  PICC line placement.  CHF.  EXAM: PORTABLE CHEST - 1 VIEW  COMPARISON:  Film at 0513 hr  FINDINGS: Right upper extremity PICC line is been placed with the tip in the distal SVC. Degree of pulmonary edema appears slightly improved compared to the prior study. Biventricular pacing/ ICD device shows stable appearance. There is stable mild cardiac enlargement. No significant pleural fluid. No pneumothorax.  IMPRESSION: 1. PICC line tip and distal SVC. 2. Interval decrease in pulmonary edema.   Electronically Signed   By: Aletta Edouard M.D.   On: 04/23/2015 16:21   Dg Chest Port 1 View  04/23/2015   CLINICAL DATA:  Chest pain and shortness of breath for 1 day.  EXAM: PORTABLE CHEST - 1 VIEW  COMPARISON:  02/23/2015  FINDINGS:  Left-sided pacemaker remains in place. Cardiomegaly is unchanged. There is vascular congestion and perihilar peribronchial cuffing suggestive of pulmonary edema. No confluent airspace disease to suggest pneumonia. No large pleural effusion or pneumothorax.  IMPRESSION: Vascular congestion and pulmonary edema, findings most consistent with CHF. Cardiomegaly is stable.   Electronically Signed   By: Jeb Levering M.D.   On: 04/23/2015 05:55    PHYSICAL EXAM General: NAD Neck: JVP 8 cm, no thyromegaly or thyroid nodule.  Lungs: Slight crackles at bases. CV: Nondisplaced PMI.  Heart regular S1/S2, no S3/S4, no murmur.  No peripheral edema.  No carotid bruit.  Normal pedal pulses.  Abdomen: Soft, nontender, no hepatosplenomegaly, no distention.  Neurologic: Alert and oriented x 3.  Psych: Normal affect. Extremities: No clubbing or cyanosis.   TELEMETRY: Reviewed telemetry pt in A-V sequential pacing  ASSESSMENT AND PLAN: 59 yo with history of CAD/ischemic cardiomyopathy, CKD, and recent admission with elevated LFTs presented with severe dyspnea and chest pain.  Found to have acute pulmonary edema at admission.  1. Acute/chronic systolic CHF: Ischemic cardiomyopathy, last EF 25-30%. St Jude CRT-D.  Patient was admitted with severe dyspnea, hypoxemia, pulmonary edema on CXR. He was diuresed with IV Lasix initially and breathing improved significantly. Still requiring oxygen by nasal cannula but feeling better and repeat CXR showed clearing of edema. ?Cause of acute exacerbation => it seems relatively sudden. Initially thought to be in atrial fibrillation at admission but interrogation of device shows likely ST with PACs.  Rise in troponin concerning for ACS.  This morning, co-ox 72% and CVP 8.  Creatinine stable.   - He will be getting blood today so will give Lasix IV with blood.  - Continue to follow CVP/co-ox.   - BP remains soft, holding ACEI. Will hold Toprol XL with acute pulmonary  edema and low BP.  - Continue qod digoxin, level is ok.  2. Atrial fibrillation: He was taken off amiodarone recently for ?amiodarone toxicity. He was admitted in 4/16 with elevated LFTs, and CT showed diffuse high attenuation liver density that could be suggestive of a number of disease processes, one of which could be amiodarone liver toxicity. Labs tests were negative for hemochromatosis and Wilson's disease. He is a-paced today.  Interrogation of device yesterday showed no significant atrial fibrillation.  - Eliquis held initially with heparin gtt.  With fall in hemoglobin and plts, heparin gtt held today.  - Will have GI see patient and comment whether this does seem to be amiodarone liver toxicity or if he can be re-challenged with amiodarone. His LFTs are improved this admission.  - If no amiodarone, I do not think we  have much option for antiarrhythmics beyond ranolazine. Tikosyn will be difficult with his renal function and would have to stop ranolazine. If he continues to have a lot of breakthrough afib/RVR and we cannot use amiodarone, AV nodal ablation may be best option.   3. CAD: NSTEMI with rise in troponin to 3.72 => 3.11 => 4.29. He initially had severe dyspnea but later had some chest pain. He is currently chest pain-free and has been so in the hospital. Cannot totally rule out demand ischemia in setting of hypoxemia/CHF and anemia, but troponin is significantly elevated.  - Statin appears to have been stopped when he was in the hospital with elevated LFTs. I will restart Crestor.  - Repeat troponin this morning.  - Off heparin now with fall in hgb and plts.  - Cardiac cath will be difficult => he has CKD III with baseline creatinine around where creatinine is currently, 2.39. May need RHC/LHC this admission, will follow (not yet).  4. CKD: Creatinine is at baseline.  5. Anemia: Patient's hemoglobin has been low long-term, significant fall this admission.  Has h/o  rectal cancer but this has been stable.  No overt bleeding.  He is not microcytic.  - Check iron indices, B12, folate.  - Given clinical picture, will transfuse 2 units with Lasix.  6. Thrombocytopenia: 50% plt drop after starting heparin.  Not typical time course for HIT but has had heparin exposure relatively recently (3/16, but not in last month).  Will stop heparin gtt for now, send HIT antibody.   35 minutes critical care time.   Loralie Champagne 04/24/2015 7:26 AM

## 2015-04-24 NOTE — Progress Notes (Signed)
Echocardiogram 2D Echocardiogram with Definity has been performed.  Tresa Res 04/24/2015, 1:03 PM

## 2015-04-24 NOTE — Progress Notes (Signed)
Notified Dr. Aundra Dubin of patient's hemoglobin of 7.1 and platelets of 134. STAT repeat CBC ordered. The patient does not have any obvious signs of bleeding, no pain or bruising, or hematomas. Made MD aware of continued soft BP in 80's/40's, patient remains asymptomatic. Will continue to monitor.

## 2015-04-24 NOTE — Progress Notes (Signed)
Initial Nutrition Assessment   INTERVENTION:  Ensure Enlive (each supplement provides 350kcal and 20 grams of protein) BID, encouraged pt to consume if not eating well.  Reviewed low sodium guidelines, will need follow up   NUTRITION DIAGNOSIS:  Predicted suboptimal nutrient intake related to  (dislike of food choices) as evidenced by per patient/family report.   GOAL:  Patient will meet greater than or equal to 90% of their needs   MONITOR:  PO intake, Supplement acceptance, Labs, Weight trends, I & O's  REASON FOR ASSESSMENT:  Consult  (malnutrition and CHF diet)  ASSESSMENT:  Pt admitted with SOB. Per pt he has lost some weight but this has been over a long period (24 lb in a couple of years) He reports that he does not really care for the food here and that he eats very small meals at home. Breakfast is cereal, lunch is soup or sandwich, and dinner is at his house 2 nights per week and at his son's house 5 times a week. Pt states he eats what he wants and then stops. When asked if he followed a low sodium diet he reported that he does not add salt to his food and only eats salt that is already in his food. He has drank ensure in the past and is willing to drink when he does not eat well.  Pt did consume 100% of his meal today.  Discussed with pt importance of avoiding high sodium foods, gave examples. Pt not interested in discussing at this time. Will work to reinforce with pt.  Labs reviewed:  BUN/Cr elevated Medications reviewed and include: lasix.    Height:  Ht Readings from Last 1 Encounters:  04/23/15 5\' 10"  (1.778 m)    Weight:  Wt Readings from Last 1 Encounters:  04/24/15 199 lb 1.2 oz (90.3 kg)    Ideal Body Weight:  75.4 kg  Wt Readings from Last 10 Encounters:  04/24/15 199 lb 1.2 oz (90.3 kg)  04/21/15 204 lb 1.6 oz (92.579 kg)  04/06/15 195 lb 8 oz (88.678 kg)  03/25/15 207 lb 12 oz (94.235 kg)  03/19/15 209 lb 2 oz (94.858 kg)  02/27/15 211  lb 11.2 oz (96.026 kg)  02/10/15 211 lb (95.709 kg)  01/17/15 201 lb 12.8 oz (91.536 kg)  01/14/15 199 lb (90.266 kg)  01/10/15 200 lb (90.719 kg)    BMI:  Body mass index is 28.56 kg/(m^2).  Estimated Nutritional Needs:  Kcal:  2100-2300  Protein:  95-105 grams  Fluid:  > 1.5 L/day  Skin:  Reviewed, no issues  Diet Order:  Diet Heart Room service appropriate?: Yes; Fluid consistency:: Thin  EDUCATION NEEDS:  Education needs addressed   Intake/Output Summary (Last 24 hours) at 04/24/15 1715 Last data filed at 04/24/15 1700  Gross per 24 hour  Intake 1674.32 ml  Output   2400 ml  Net -725.68 ml    Last BM:  PTA, pt has a colostomy  [place], [place], [place] Pager [phone] After Hours Pager

## 2015-04-24 NOTE — Progress Notes (Signed)
[place] for apixaban >> heparin  Indication: chest pain/ACS and atrial fibrillation  No Known Allergies  Patient Measurements: Height: 5\' 10"  (177.8 cm) Weight: 199 lb 1.2 oz (90.3 kg) IBW/kg (Calculated) : 73 Heparin Dosing Weight: 85kg  Vital Signs: Temp: 98.4 F (36.9 C) (05/19 1110) Temp Source: Oral (05/19 1110) BP: 102/47 mmHg (05/19 1110) Pulse Rate: 73 (05/19 1110)  Labs:  Recent Labs  04/23/15 0503  04/23/15 1250 04/23/15 1800 04/23/15 2330 04/24/15 0420 04/24/15 0600 04/24/15 0745  HGB 9.3*  --   --   --   --  7.1* 7.3*  --   HCT 30.0*  --   --   --   --  22.5* 22.2*  --   PLT 229  --   --   --   --  134* 126*  --   APTT  --   --  35  --   --  63*  --   --   HEPARINUNFRC  --   --  >2.20*  --   --  >2.20*  --   --   CREATININE 2.37*  --   --   --   --  2.39*  --   --   TROPONINI 0.04*  < >  --  4.29* 3.11*  --   --  2.13*  < > = values in this interval not displayed.  Estimated Creatinine Clearance: 37.1 mL/min (by C-G formula based on Cr of 2.39).   Medical History: Past Medical History  Diagnosis Date  . Essential hypertension, benign   . CAD (coronary artery disease)     a. BMS to LAD 2001 at [facility] b. PTCA/atherectomy ramus and BMS to LAD 2009  . Chronic systolic heart failure   . GERD (gastroesophageal reflux disease)   . Paroxysmal atrial fibrillation     a. on amiodarone, digoxin and Eliquis  . Adenocarcinoma of rectum     a. 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 Prostate cancer     a. s/p seed implants with chemo and radiation  . Biventricular ICD (implantable cardioverter-defibrillator) in place 2009    upgrade  . TIA (transient ischemic attack)   . OSA on CPAP   . HLD (hyperlipidemia)   . Orthostatic hypotension   . Dizziness     a. chronic. Admission for this 07/18/2014  . History of falling July 2015    due to dizziness from medications  . Ischemic cardiomyopathy     EF 18% by nuclear study 2016, multiple  myocardial infarctions in past    . Chronic kidney disease, stage IV (severe) 12/11/2013  . DM type 2, uncontrolled, with renal complications 7/90/2409  . Obesity 12/12/2013  . Hematuria 03/2015   Assessment: 59 year old with history of ischemic cardiomyopathy, CAD s/p BMS LAD in 2001, PTCA ramus and BMS LAD in 2009, Cardiolite (3/16) with EF 18%, no ischemia, prior anterior, apical and inferior infarctionBiV ICD upgrade 2009, paroxysmal atrial fibrillation, CKD, and recent traumatic [facility] 1/16.  Patient admitted for volume overload/sob, found to have bump in CE's. Hx of afib on apixaban with dose given in ED this am and noted compliance at home. To transition to IV heparin 5/18.  HL>2.2 this am suggestive of recent apixaban, aptt 63 on 1100/hr -no overt bleeding noted however hgb down to 7.3 and plt dropped to 126. Heparin stopped for today.  Goal of Therapy:  Heparin level 0.3-0.7 aPTT 66-102 seconds Monitor platelets by anticoagulation protocol: Yes  Plan:  Hold heparin for now HIT panel sent for r/o  Erin Hearing PharmD., [facility] Clinical Pharmacist Pager [phone] 04/24/2015 11:21 AM

## 2015-04-24 NOTE — Consult Note (Signed)
[facility] Consult: 2:43 PM 04/24/2015  LOS: 1 day    Referring Provider: Dr Aundra Dubin  Primary Care Physician:  Glo Herring., MD Primary Gastroenterologist:  Dr. Gala Romney.      Reason for Consultation:  Question amiodarone toxicity.   HPI: [name] is a 59 y.o. male.  Hx HTN, CAD s/p stents x 2 LAD, LCx, chronic systolic HF/ischemic cardiomyopathy, CKD 4, DM 2, GERD, paroxysmal AF on Eliquis, h/o VF, adenocarcinoma of rectum s/p APR, prostate cancer, biventricular ICD (ICD/pacemaker) 04/10/15, TIA, OSA on cpap, HLD, orthostatic hypotension, dizziness, h/o falls, obesity, resolved hematuria, h/o gallbladder disease.    12/11/2014 colonoscopy: Via colostomy. Normal appearing residual colonic mucosa from colostomy to cecum. Plan repeat surveillance colonoscopy in 2021, 5 years. 12/11/14 EGD by Dr Gala Romney.  To evaluate thickened esophagus seen on CT scan. Study to the second portion of duodenum was entirely normal.  Since the beginning of this year patient has had a couple of attacks of upper abdominal pain attributed to his gallstones. He has met with Dr. Dalbert Batman, general surgeon.  During late April 2016 admission the patient had total bilirubin of 3.6, alkaline phosphatase 457, AST 331, ALT 149.  Ultrasound showed contracted gallbladder, gallbladder wall thickening, gallstones. Appearance suggested chronic cholecystitis. CBD measured 6 mm. Splenomegaly noted.. CT showed diffuse, high attenuation density of the liver without focal lesions, suspicious for heavy metal deposition, ? Wilson disease, glycogen-storage disease, amiodarone toxicity.... 2.7 cm nodule in the right perirectal region was suspicious for recurrent cancer.  Transaminitis was attributed to past gallstone versus amiodarone toxicity.  Lab tests were  negative for hemochromatosis and Wilson's disease. His CEA level was 1.4 and PSA level 0.09.  He did not undergo liver biopsy. Amiodarone as well as Crestor were discontinued.  Patient says during that hospitalization he never had any abdominal pain. He says that he has had mildly elevated LFTs for several months but that they became acutely elevated during the late April admission. Since that discharge patient has met with the surgeon. Plan is for laparoscopic cholecystectomy once patient can get healthy enough and is cleared by cardiology for surgery. Since his discharge he has not had any attacks suggestive of biliary colic.  On 04/21/15 Dr. Benay Spice saw the patient in the office. He reviewed the CT scan and is not worried by the findings. Plan is to follow CEA level and CT scan in 4 months.  Patient admitted yesterday.  Was SOB.  He denies abdominal pain and n/v, though these were mentioned in the H& P. His LFTs have all improved. He was ruled in for another bout of acute systolic heart failure/pulmonary edema.  Troponins in the threes so he was ruled in for NSTEMI.  His shortness of breath has resolved. The chest pain has resolved. He never really had significant abdominal pain. His appetite is been good and his weight has been stable.  Never sees blood in his ostomy or per his rectum. No nausea or vomiting up until Dr. Aundra Dubin would like to know if we think that this was amiodarone  toxicity versus a biliary source for his LFT abnormalities. He would like to be able to be challenge the patient with amiodarone. Patient's platelets have dropped after starting heparin drip, so this medication has been discontinued  Past Medical History  Diagnosis Date  . Essential hypertension, benign   . CAD (coronary artery disease)     a. BMS to LAD 2001 at [facility] b. PTCA/atherectomy ramus and BMS to LAD 2009  . Chronic systolic heart failure   . GERD (gastroesophageal reflux disease)   . Paroxysmal atrial  fibrillation     a. on amiodarone, digoxin and Eliquis  . Adenocarcinoma of rectum     a. 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 Prostate cancer     a. s/p seed implants with chemo and radiation  . Biventricular ICD (implantable cardioverter-defibrillator) in place 2009    upgrade  . TIA (transient ischemic attack)   . OSA on CPAP   . HLD (hyperlipidemia)   . Orthostatic hypotension   . Dizziness     a. chronic. Admission for this 07/18/2014  . History of falling July 2015    due to dizziness from medications  . Ischemic cardiomyopathy     EF 18% by nuclear study 2016, multiple myocardial infarctions in past    . Chronic kidney disease, stage IV (severe) 12/11/2013  . DM type 2, uncontrolled, with renal complications 5/91/6384  . Obesity 12/12/2013  . Hematuria 03/2015    Past Surgical History  Procedure Laterality Date  . Internal defibrillator and pacemaker  2002  . Abdominal and perineal resection of rectum with total mesorectal excision  10/04/2007  . Colonoscopy  09/14/2011    Dr. Gala Romney: via colostomy, Single pedunculated benign inflammatory polyp. Due for surveillance Oct 2015  . Colostomy  2008  . Colonoscopy N/A 07/02/2014    Procedure: COLONOSCOPY;  Surgeon: Daneil Dolin, MD;  Location: [facility];  Service: Endoscopy;  Laterality: N/A;  7:30 / COLONOSCOPY THRU COLOSTOMY  . Esophagogastroduodenoscopy N/A 07/02/2014    Procedure: ESOPHAGOGASTRODUODENOSCOPY (EGD);  Surgeon: Daneil Dolin, MD;  Location: [facility];  Service: Endoscopy;  Laterality: N/A;  7:30  . Savory dilation N/A 07/02/2014    Procedure: SAVORY DILATION;  Surgeon: Daneil Dolin, MD;  Location: [facility];  Service: Endoscopy;  Laterality: N/A;  7:30  . Maloney dilation N/A 07/02/2014    Procedure: Venia Minks DILATION;  Surgeon: Daneil Dolin, MD;  Location: [facility];  Service: Endoscopy;  Laterality: N/A;  7:30  . Portacath placement  06/2007    "removed ~ 1 yr later"  . Cardiac catheterization  08/2001  . Coronary  angioplasty with stent placement  2001; ~ 2006    "1 + 1"   . Left heart catheterization with coronary angiogram N/A 07/13/2013    Procedure: LEFT HEART CATHETERIZATION WITH CORONARY ANGIOGRAM;  Surgeon: Lorretta Harp, MD;  Location: [facility] CATH LAB;  Service: Cardiovascular;  Laterality: N/A;  . Colonoscopy N/A 12/11/2014    Dr. Gala Romney via colostomy. Normal. Repeat in 2021.   Marland Kitchen Esophagogastroduodenoscopy N/A 12/11/2014    [id number] EGD  . Cardiac defibrillator placement  2009    Upgraded to a BiV ICD  . Right heart catheterization N/A 02/24/2015    Procedure: RIGHT HEART CATH;  Surgeon: Jolaine Artist, MD;  Location: [facility] CATH LAB;  Service: Cardiovascular;  Laterality: N/A;  . Ep implantable device N/A 04/10/2015    Procedure: Ppm/Biv Ppm Generator Changeout;  Surgeon: Evans Lance, MD;  Location: [facility]  INVASIVE CV LAB CUPID;  Service: Cardiovascular;  Laterality: N/A;    Prior to Admission medications   Medication Sig Start Date End Date Taking? Authorizing Provider  apixaban (ELIQUIS) 5 MG TABS tablet Take 5 mg by mouth 2 (two) times daily.   Yes Historical Provider, MD  aspirin EC 81 MG tablet Take 81 mg by mouth daily.   Yes Historical Provider, MD  co-enzyme Q-10 50 MG capsule Take 50 mg by mouth every morning.    Yes Historical Provider, MD  digoxin (LANOXIN) 0.125 MG tablet Take 1 tablet by mouth every other day. 10/29/14  Yes Historical Provider, MD  enalapril (VASOTEC) 10 MG tablet Take 10 mg by mouth daily. 04/19/15  Yes Historical Provider, MD  fluticasone (FLONASE) 50 MCG/ACT nasal spray Place 1 spray into both nostrils daily as needed for allergies.  11/21/13  Yes Historical Provider, MD  furosemide (LASIX) 40 MG tablet Take 1 tablet (40 mg total) by mouth daily as needed. Patient taking differently: Take 40 mg by mouth daily as needed for fluid or edema.  02/10/15  Yes Arnoldo Lenis, MD  isosorbide dinitrate (ISORDIL) 30 MG tablet Take 15 mg by mouth 2 (two) times daily.  07/31/14   Yes Historical Provider, MD  metoprolol succinate (TOPROL-XL) 25 MG 24 hr tablet Take 50mg  (2 tablets) in am and 25mg  (1 tablet) in pm. 03/10/15  Yes Larey Dresser, MD  nitroGLYCERIN (NITROLINGUAL) 0.4 MG/SPRAY spray Place 1 spray under the tongue every 5 (five) minutes as needed. angina Patient taking differently: Place 1 spray under the tongue every 5 (five) minutes as needed for chest pain. angina 07/19/14  Yes Arnoldo Lenis, MD  pantoprazole (PROTONIX) 40 MG tablet Take 1 tablet (40 mg total) by mouth daily. 07/19/14  Yes Arnoldo Lenis, MD  ranolazine (RANEXA) 500 MG 12 hr tablet Take 1 tablet (500 mg total) by mouth 2 (two) times daily. 02/27/15  Yes Amy D Clegg, NP  glipiZIDE (GLUCOTROL) 5 MG tablet Take 1 tablet (5 mg total) by mouth daily before breakfast. Patient not taking: Reported on 04/17/2015 04/06/15   Corky Sox, MD    Scheduled Meds: . aspirin EC  81 mg Oral Daily  . digoxin  125 mcg Oral QODAY  . furosemide  60 mg Intravenous BID  . insulin aspart  0-15 Units Subcutaneous TID WC  . insulin aspart  0-5 Units Subcutaneous QHS  . isosorbide dinitrate  15 mg Oral BID  . pantoprazole  40 mg Oral Daily  . ranolazine  500 mg Oral BID  . rosuvastatin  40 mg Oral q1800  . sodium chloride  10-40 mL Intracatheter Q12H  . sodium chloride  3 mL Intravenous Q12H   Infusions:   PRN Meds: fluticasone, nitroGLYCERIN, promethazine, sodium chloride   Allergies as of 04/23/2015  . (No Known Allergies)    Family History  Problem Relation Age of Onset  . Colon cancer Mother 69  . Colon cancer Sister 1  . Coronary artery disease Father   . Colon cancer Other     2 cousins, succumbed to illness    History   Social History  . Marital Status: Married    Spouse Name: N/A  . Number of Children: 1  . Years of Education: N/A   Occupational History  .     Social History Main Topics  . Smoking status: Never Smoker   . Smokeless tobacco: Never Used  . Alcohol Use: No  Comment: Former user 45 years ago  . Drug Use: No  . Sexual Activity: No   Other Topics Concern  . Not on file   Social History Narrative   Married 59 years   1 son, 2 grandkids    Denies alcohol, drugs, tobacco     REVIEW OF SYSTEMS: Please see history of present illness for pertinent symptom findings. A complete 12 system review was completed.   PHYSICAL EXAM: Vital signs in last 24 hours: Filed Vitals:   04/24/15 1355  BP: 97/49  Pulse: 77  Temp:   Resp: 25   Wt Readings from Last 3 Encounters:  04/24/15 199 lb 1.2 oz (90.3 kg)  04/21/15 204 lb 1.6 oz (92.579 kg)  04/06/15 195 lb 8 oz (88.678 kg)    General: Pleasant, alert. Looks somewhat chronically unwell but not acutely ill. Head:  Some slight facial edema. Skin is reddened/sun burnt in appearance.  Eyes:  No scleral icterus, no conjunctival pallor. Ears:  Hearing intact.  Nose:  No nasal discharge or congestion Mouth:  Clear, moist. Neck:  No mass, no JVD. No TMG. Lungs:  Slight crackles in the bases bilaterally. No dyspnea. No cough. Heart: RRR. S1/S2 audible. No murmurs, rubs or gallops. Abdomen:  Soft, obese/protuberant. Active bowel sounds. No HSM. No masses. No bruits.. Ostomy in position on left abdomen. This is opaque and unable to visualize contained stool.  Rectal: Deferred.   Musc/Skeltl: No joint swelling or deformities. Extremities:  No pedal edema. Feet are warm with good perfusion.  Neurologic:  Patient is alert. Oriented 3. No limb weakness. No tremor or asterixis. Skin:  Hyperemia of the skin of the trunk as well as the face. Nodes:  No cervical adenopathy.   Psych:  Cooperative, pleasant. Not agitated.  Intake/Output from previous day: 05/18 0701 - 05/19 0700 In: 517.3 [P.O.:410; I.V.:107.3] Out: 2725 [Urine:2725] Intake/Output this shift: Total I/O In: 282 [Blood:282] Out: -   LAB RESULTS:  Recent Labs  04/23/15 0503 04/24/15 0420 04/24/15 0600  WBC 11.8* 8.0 8.4  HGB 9.3*  7.1* 7.3*  HCT 30.0* 22.5* 22.2*  PLT 229 134* 126*   BMET Lab Results  Component Value Date   NA 135 04/24/2015   NA 134* 04/23/2015   NA 135 04/08/2015   K 3.7 04/24/2015   K 4.0 04/23/2015   K 4.5 04/08/2015   CL 99* 04/24/2015   CL 99* 04/23/2015   CL 105 04/08/2015   CO2 27 04/24/2015   CO2 21* 04/23/2015   CO2 22 04/08/2015   GLUCOSE 222* 04/24/2015   GLUCOSE 418* 04/23/2015   GLUCOSE 346* 04/08/2015   BUN 27* 04/24/2015   BUN 22* 04/23/2015   BUN 22 04/08/2015   CREATININE 2.39* 04/24/2015   CREATININE 2.37* 04/23/2015   CREATININE 2.20* 04/08/2015   CALCIUM 8.3* 04/24/2015   CALCIUM 9.5 04/23/2015   CALCIUM 8.4 04/08/2015   LFT  Recent Labs  04/23/15 0503  PROT 7.9  ALBUMIN 3.1*  AST 25  ALT 14*  ALKPHOS 123  BILITOT 1.7*   PT/INR Lab Results  Component Value Date   INR 1.19 04/08/2015   INR 1.53* 04/04/2015   INR 1.73* 04/03/2015   Hepatitis Panel No results for input(s): HEPBSAG, HCVAB, HEPAIGM, HEPBIGM in the last 72 hours. C-Diff No components found for: CDIFF Lipase     Component Value Date/Time   LIPASE 28 04/03/2015 1629    Drugs of Abuse     Component Value Date/Time   LABOPIA  NONE DETECTED 04/23/2015 1112   COCAINSCRNUR NONE DETECTED 04/23/2015 1112   LABBENZ NONE DETECTED 04/23/2015 1112   AMPHETMU NONE DETECTED 04/23/2015 1112   THCU NONE DETECTED 04/23/2015 1112   LABBARB NONE DETECTED 04/23/2015 1112     RADIOLOGY STUDIES: Dg Chest Port 1 View  04/23/2015   CLINICAL DATA:  PICC line placement.  CHF.  EXAM: PORTABLE CHEST - 1 VIEW  COMPARISON:  Film at 0513 hr  FINDINGS: Right upper extremity PICC line is been placed with the tip in the distal SVC. Degree of pulmonary edema appears slightly improved compared to the prior study. Biventricular pacing/ ICD device shows stable appearance. There is stable mild cardiac enlargement. No significant pleural fluid. No pneumothorax.  IMPRESSION: 1. PICC line tip and distal SVC. 2.  Interval decrease in pulmonary edema.   Electronically Signed   By: Aletta Edouard M.D.   On: 04/23/2015 16:21   Dg Chest Port 1 View  04/23/2015   CLINICAL DATA:  Chest pain and shortness of breath for 1 day.  EXAM: PORTABLE CHEST - 1 VIEW  COMPARISON:  02/23/2015  FINDINGS: Left-sided pacemaker remains in place. Cardiomegaly is unchanged. There is vascular congestion and perihilar peribronchial cuffing suggestive of pulmonary edema. No confluent airspace disease to suggest pneumonia. No large pleural effusion or pneumothorax.  IMPRESSION: Vascular congestion and pulmonary edema, findings most consistent with CHF. Cardiomegaly is stable.   Electronically Signed   By: Jeb Levering M.D.   On: 04/23/2015 05:55    ENDOSCOPIC STUDIES: Per HPI  IMPRESSION:   *  Recent acute hepatitis, jaundice. Question passed gallstone versus amiodarone toxicity. Transaminase pattern last month with AST ALT ratio suggestive of alcohol injury. LFTs, with the exception of the total bilirubin, have normalized.  *  Perirectal nodule seen on CT last admission, worrisome for recurrent rectal cancer. Status post low anterior resection for rectal cancer in 2008.  Underwent surveillance colonoscopy via colostomy January 2016. However this area did not include visualization of the rectum. Patient met with Dr. Benay Spice 04/21/15. He reviewed the CT scan with radiology and has low clinical suspicion for recurrent rectal cancer. Plan is to repeat the CT scan in 3-4 months.  *  Congestive heart failure. Patient has pacemaker/ICD in place and currently has acute pulmonary edema with ruling in for NSTEMI.  Chest pain on admission has resolved.   *  Atrial fibrillation. Chronic Eliquis. Amiodarone discontinued in April 2016 due to elevated LFTs. Cardiology would like to be able to rechallenge the patient with amiodarone.  *  Gallstones. 03/2015 ultrasound suggested chronic cholecystitis. Met with Dr. Dalbert Batman 2 weeks ago. That  surgeon would like to pursue cholecystectomy once patient is cleared by cardiology.  *  Chronic anemia. Normal MCV. Now status post transfusion with 2 PRBCs. Suspect he has anemia related to his stage 4 CKD and multiple medical problems.  No sxs/signs to suggest blood loss.     PLAN:     *  Per Dr Henrene Pastor.     Azucena Freed  04/24/2015, 2:43 PM Pager: [phone]  GI ATTENDING  History, laboratory, x-rays reviewed. Patient personally seen and examined. Wife in room. Agree with consult note as outlined above. Please gentleman with multiple significant medical problems. We are asked to see him regarding abnormal liver tests and prospects of restarting amiodarone. Patient has had mild elevation of AST for many years since starting amiodarone. Recent acute elevation several weeks ago as noted. Significant improvement in just a few weeks since discontinuing  amiodarone. Given the half-life of amiodarone, I think it would be unusual for such rapid improvement. Furthermore, the abnormalities were cholestatic in nature. I suspect he may have had passive congestion of the liver or even possibly symptomatically gallstone disease (could not obtain MRI/MRCP because of AICD) is. In any event, asymptomatic at this time from abdominal perspective. Okay to resume amiodarone with close monitoring of liver tests. I have discussed this with the patient and his wife. Please call for additional questions or assistance. Thank you  Docia Chuck. Geri Seminole., M.D. [facility] Division of Gastroenterology

## 2015-04-24 NOTE — Clinical Documentation Improvement (Signed)
MD's, NP's, and, PA's  Noted elevated troponin levels 3.72 => 3.11 => 4.29 and  NSTEMI,  please provide documentation if diagnosis was ruled in for this admission.   Medicare rules require specification as to whether an inpatient diagnosis was present at the time of admission.    Please clarify if the following diagnosis NSTEMI was:  Marland Kitchen Present at the time of admission . NOT present at the time of inpatient admission and it developed during the inpatient stay . Unable to clinically determine whether the condition was present on admission. . Documentation insufficient to determine if condition was present at the time of inpatient admission  Thank You, Ree Kida ,RN Clinical Documentation Specialist:  [facility]

## 2015-04-25 ENCOUNTER — Inpatient Hospital Stay (HOSPITAL_COMMUNITY): Payer: Medicare Other

## 2015-04-25 DIAGNOSIS — J9601 Acute respiratory failure with hypoxia: Secondary | ICD-10-CM

## 2015-04-25 DIAGNOSIS — G4733 Obstructive sleep apnea (adult) (pediatric): Secondary | ICD-10-CM | POA: Diagnosis not present

## 2015-04-25 LAB — GLUCOSE, CAPILLARY
GLUCOSE-CAPILLARY: 142 mg/dL — AB (ref 65–99)
GLUCOSE-CAPILLARY: 289 mg/dL — AB (ref 65–99)
Glucose-Capillary: 250 mg/dL — ABNORMAL HIGH (ref 65–99)
Glucose-Capillary: 196 mg/dL — ABNORMAL HIGH (ref 65–99)

## 2015-04-25 LAB — COMPREHENSIVE METABOLIC PANEL
ALT: 9 U/L — AB (ref 17–63)
AST: 19 U/L (ref 15–41)
Albumin: 2.2 g/dL — ABNORMAL LOW (ref 3.5–5.0)
Alkaline Phosphatase: 67 U/L (ref 38–126)
Anion gap: 9 (ref 5–15)
BUN: 27 mg/dL — ABNORMAL HIGH (ref 6–20)
CALCIUM: 8.5 mg/dL — AB (ref 8.9–10.3)
CO2: 27 mmol/L (ref 22–32)
CREATININE: 2.34 mg/dL — AB (ref 0.61–1.24)
Chloride: 98 mmol/L — ABNORMAL LOW (ref 101–111)
GFR calc Af Amer: 33 mL/min — ABNORMAL LOW (ref >60)
GFR calc non Af Amer: 29 mL/min — ABNORMAL LOW (ref >60)
GLUCOSE: 171 mg/dL — AB (ref 65–99)
Potassium: 3.5 mmol/L (ref 3.5–5.1)
SODIUM: 134 mmol/L — AB (ref 135–145)
TOTAL PROTEIN: 6.7 g/dL (ref 6.5–8.1)
Total Bilirubin: 2 mg/dL — ABNORMAL HIGH (ref 0.3–1.2)

## 2015-04-25 LAB — TYPE AND SCREEN
ABO/RH(D): O POS
Antibody Screen: NEGATIVE
UNIT DIVISION: 0
Unit division: 0
Unit division: 0

## 2015-04-25 LAB — CARBOXYHEMOGLOBIN
Carboxyhemoglobin: 2.6 % — ABNORMAL HIGH (ref 0.5–1.5)
METHEMOGLOBIN: 1.1 % (ref 0.0–1.5)
O2 Saturation: 78.5 %
Total hemoglobin: 10.1 g/dL — ABNORMAL LOW (ref 13.5–18.0)

## 2015-04-25 LAB — CBC
HCT: 29.3 % — ABNORMAL LOW (ref 39.0–52.0)
HEMOGLOBIN: 9.5 g/dL — AB (ref 13.0–17.0)
MCH: 29 pg (ref 26.0–34.0)
MCHC: 32.4 g/dL (ref 30.0–36.0)
MCV: 89.3 fL (ref 78.0–100.0)
PLATELETS: 135 10*3/uL — AB (ref 150–400)
RBC: 3.28 MIL/uL — AB (ref 4.22–5.81)
RDW: 15.5 % (ref 11.5–15.5)
WBC: 7.1 10*3/uL (ref 4.0–10.5)

## 2015-04-25 LAB — FOLATE RBC
Folate, Hemolysate: 374.1 ng/mL
Folate, RBC: 1585 ng/mL (ref >498)
Hematocrit: 23.6 % — ABNORMAL LOW (ref 37.5–51.0)

## 2015-04-25 MED ORDER — POTASSIUM CHLORIDE CRYS ER 20 MEQ PO TBCR
40.0000 meq | EXTENDED_RELEASE_TABLET | Freq: Once | ORAL | Status: AC
  Administered 2015-04-25: 40 meq via ORAL
  Filled 2015-04-25: qty 2

## 2015-04-25 MED ORDER — FUROSEMIDE 40 MG PO TABS
40.0000 mg | ORAL_TABLET | Freq: Every day | ORAL | Status: DC
Start: 2015-04-25 — End: 2015-04-27
  Administered 2015-04-25 – 2015-04-27 (×3): 40 mg via ORAL
  Filled 2015-04-25 (×3): qty 1

## 2015-04-25 MED ORDER — APIXABAN 5 MG PO TABS
5.0000 mg | ORAL_TABLET | Freq: Two times a day (BID) | ORAL | Status: DC
  Administered 2015-04-25 – 2015-04-27 (×5): 5 mg via ORAL
  Filled 2015-04-25 (×6): qty 1

## 2015-04-25 MED ORDER — SODIUM CHLORIDE 0.9 % IV SOLN
INTRAVENOUS | Status: DC | PRN
  Administered 2015-04-25: 09:00:00 via INTRAVENOUS

## 2015-04-25 MED ORDER — SODIUM CHLORIDE 0.9 % IV SOLN
510.0000 mg | INTRAVENOUS | Status: DC
  Administered 2015-04-25: 510 mg via INTRAVENOUS
  Filled 2015-04-25: qty 17

## 2015-04-25 MED ORDER — AMIODARONE HCL 200 MG PO TABS
200.0000 mg | ORAL_TABLET | Freq: Every day | ORAL | Status: DC
  Administered 2015-04-25 – 2015-04-27 (×3): 200 mg via ORAL
  Filled 2015-04-25 (×3): qty 1

## 2015-04-25 NOTE — Progress Notes (Signed)
Inpatient Diabetes Program Recommendations  AACE/ADA: New Consensus Statement on Inpatient Glycemic Control (2013)  Target Ranges:  Prepandial:   less than 140 mg/dL      Peak postprandial:   less than 180 mg/dL (1-2 hours)      Critically ill patients:  140 - 180 mg/dL  Results for NAIM, MURTHA (MRN [id number]) as of 04/25/2015 11:06  Ref. Range 04/24/2015 07:40 04/24/2015 11:49 04/24/2015 16:59 04/24/2015 22:22 04/25/2015 07:35  Glucose-Capillary Latest Ref Range: 65-99 mg/dL 174 (H) 222 (H) 301 (H) 229 (H) 142 (H)   Inpatient Diabetes Program Recommendations Insulin - Meal Coverage: add Novolog 4 units TID with meals per glycemic control order set Thank you  Raoul Pitch BSN, RN,CDE Inpatient Diabetes Coordinator [phone] (team pager)

## 2015-04-25 NOTE — Research (Signed)
Pt has multiple exclusions and does not qualify for Genetic AF Research Study.

## 2015-04-25 NOTE — Progress Notes (Signed)
Patient ID: LOXLEY SCHMALE, male   DOB: [birth year], 59 y.o.   MRN: [id number] P  SUBJECTIVE:  Patient diuresed well again yesterday, he is no longer short of breath.  No chest pain.  Feels fine but blood pressure still soft with SBP 90s.  Hgb to 10 after transfusion.  No overt bleeding.  CVP 4 today with co-ox 78%.   Scheduled Meds: . amiodarone  200 mg Oral Daily  . aspirin EC  81 mg Oral Daily  . digoxin  125 mcg Oral QODAY  . feeding supplement (ENSURE ENLIVE)  237 mL Oral BID BM  . ferumoxytol  510 mg Intravenous Weekly  . furosemide  40 mg Oral Daily  . insulin aspart  0-15 Units Subcutaneous TID WC  . insulin aspart  0-5 Units Subcutaneous QHS  . isosorbide dinitrate  15 mg Oral BID  . pantoprazole  40 mg Oral Daily  . ranolazine  500 mg Oral BID  . rosuvastatin  40 mg Oral q1800  . sodium chloride  10-40 mL Intracatheter Q12H  . sodium chloride  3 mL Intravenous Q12H   Continuous Infusions:  PRN Meds:.fluticasone, nitroGLYCERIN, promethazine, sodium chloride    Filed Vitals:   04/25/15 0524 04/25/15 0543 04/25/15 0600 04/25/15 0704  BP: 82/42 84/48 109/55 88/43  Pulse: 73 73 73 73  Temp:      TempSrc:      Resp: 16 17 14 17   Height:      Weight:      SpO2: 94% 84% 98% 96%    Intake/Output Summary (Last 24 hours) at 04/25/15 0813 Last data filed at 04/25/15 0200  Gross per 24 hour  Intake   1397 ml  Output   3950 ml  Net  -2553 ml    LABS: Basic Metabolic Panel:  Recent Labs  04/23/15 0503 04/24/15 0420 04/25/15 0430  NA 134* 135 134*  K 4.0 3.7 3.5  CL 99* 99* 98*  CO2 21* 27 27  GLUCOSE 418* 222* 171*  BUN 22* 27* 27*  CREATININE 2.37* 2.39* 2.34*  CALCIUM 9.5 8.3* 8.5*  MG 1.9 1.7  --    Liver Function Tests:  Recent Labs  04/23/15 0503 04/25/15 0430  AST 25 19  ALT 14* 9*  ALKPHOS 123 67  BILITOT 1.7* 2.0*  PROT 7.9 6.7  ALBUMIN 3.1* 2.2*   No results for input(s): LIPASE, AMYLASE in the last 72 hours. CBC:  Recent Labs  04/24/15 0420 04/24/15 0600  WBC 8.0 8.4  NEUTROABS 6.1 6.3  HGB 7.1* 7.3*  HCT 22.5* 22.2*  MCV 91.5 91.7  PLT 134* 126*   Cardiac Enzymes:  Recent Labs  04/23/15 1800 04/23/15 2330 04/24/15 0745  TROPONINI 4.29* 3.11* 2.13*   BNP: Invalid input(s): POCBNP D-Dimer: No results for input(s): DDIMER in the last 72 hours. Hemoglobin A1C: No results for input(s): HGBA1C in the last 72 hours. Fasting Lipid Panel: No results for input(s): CHOL, HDL, LDLCALC, TRIG, CHOLHDL, LDLDIRECT in the last 72 hours. Thyroid Function Tests: No results for input(s): TSH, T4TOTAL, T3FREE, THYROIDAB in the last 72 hours.  Invalid input(s): FREET3 Anemia Panel:  Recent Labs  04/24/15 0745  VITAMINB12 221  FERRITIN 228  TIBC 340  IRON 17*    RADIOLOGY: Ct Abdomen Pelvis Wo Contrast  04/05/2015   CLINICAL DATA:  LFTs improved. No significant complaints today. Urine is still dark in color. Passed clots. History of colostomy.  EXAM: CT ABDOMEN AND PELVIS WITHOUT CONTRAST  TECHNIQUE: Multidetector  CT imaging of the abdomen and pelvis was performed following the standard protocol without IV contrast.  COMPARISON:  02/07/2015  FINDINGS: Lower chest: Pacing leads and coronary stents. Small bilateral pleural effusions associated with bibasilar atelectasis. No pulmonary nodules.  Upper abdomen: The noncontrast appearance of the liver is high attenuation. Liver is normal in size. No focal lesions are identified. Gallbladder is present a contains probable small calcified gallstone. The spleen is normal in appearance. No focal abnormality identified within the pancreas, adrenal glands, or kidneys.  Gastrointestinal tract: The stomach and small bowel loops are normal in appearance. Left mid abdominal colostomy. There is soft tissue in the region of the rectum possibly representing a rectal stump and unchanged. No evidence for bowel obstruction. The appendix is well seen and has a normal appearance.  Pelvis:  There is no free pelvic fluid. Seeds versus clips within the prostate gland. Urinary bladder is distended. Adjacent to the rectum in the region of the ischiorectal fossa there is a small soft tissue nodule measuring 2.3 x 2.6 x 2.7 cm. Findings are suspicious for recurrence of rectal cancer.  Retroperitoneum: No retroperitoneal or mesenteric adenopathy. No evidence for aortic aneurysm.  Abdominal wall: Left mid abdominal ostomy site is unremarkable. There is mild dependent body wall edema.  Osseous structures: Lower thoracic and lumbar spondylosis. No suspicious lytic or blastic lesions are identified.  IMPRESSION: 1. Diffusely high attenuation liver density without focal lesion. Findings are suspicious for heavy metal deposition. 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 Glycogen storage disease, amiodarone toxicity and gold therapy can also cause this appearance. 2. Left mid abdominal colostomy is unremarkable in appearance. 3. 2.7 cm nodule in the right perirectal region, suspicious for recurrent cancer.   Electronically Signed   By: Nolon Nations M.D.   On: 04/05/2015 18:01   Nm Hepatobiliary Liver Func  04/04/2015   CLINICAL DATA:  Chronic cholecystitis  EXAM: NUCLEAR MEDICINE HEPATOBILIARY IMAGING  TECHNIQUE: Sequential images of the abdomen were obtained out to 60 minutes following intravenous administration of radiopharmaceutical.  RADIOPHARMACEUTICALS:  5.3 mCi [place] WI-20B Choletec  COMPARISON:  None.  FINDINGS: Mild delay in clearance of blood pool. Uptake in the liver is homogeneous and adequate. The central biliary tree and bowel is seen by 10 minutes. The gallbladder was not visualized at 1 hour and 3.6 mg of morphine was given intravenously. The gallbladder subsequently filled.  IMPRESSION: 1. Patent cystic duct with delayed gallbladder filling. 2. Patent common bile duct.   Electronically Signed   By: Monte Fantasia M.D.   On: 04/04/2015  16:18   US Abdomen Complete  04/03/2015   CLINICAL DATA:  Abdominal pain, stage IV renal disease.  EXAM: ULTRASOUND ABDOMEN COMPLETE  COMPARISON:  CT 02/07/2015  FINDINGS: Gallbladder: Gallbladder is contracted and thick-walled. Several gallstones are noted measuring up to 5 mm. There some debris within the gallbladder. Negative sonographic Murphy's sign.  Common bile duct: Diameter: Upper limits of normal at 6 mm.  Liver: No focal lesion identified. Within normal limits in parenchymal echogenicity.  IVC: Within normal limits  Pancreas: Visualized portion unremarkable.  Spleen: Mildly enlarged spleen with a calculated volume of 657 cubic cm.  Right Kidney: Length: 11.2 cm. Echogenicity within normal limits. No mass or hydronephrosis visualized.  Left Kidney: Length: 11.4 cm. Echogenicity within normal limits. No mass or hydronephrosis visualized.  Abdominal aorta: No aneurysm visualized.  Other findings: Small right pleural effusion.  IMPRESSION: 1. Contracted gallbladder with wall thickening and gallstones suggests  chronic cholecystitis. Negative sonographic Murphy's sign. 2. Common bile duct upper limits of normal. 3. Mild splenomegaly. 4. Small right pleural effusion.   Electronically Signed   By: Suzy Bouchard M.D.   On: 04/03/2015 21:09   Dg Chest Port 1 View  04/23/2015   CLINICAL DATA:  PICC line placement.  CHF.  EXAM: PORTABLE CHEST - 1 VIEW  COMPARISON:  Film at 0513 hr  FINDINGS: Right upper extremity PICC line is been placed with the tip in the distal SVC. Degree of pulmonary edema appears slightly improved compared to the prior study. Biventricular pacing/ ICD device shows stable appearance. There is stable mild cardiac enlargement. No significant pleural fluid. No pneumothorax.  IMPRESSION: 1. PICC line tip and distal SVC. 2. Interval decrease in pulmonary edema.   Electronically Signed   By: Aletta Edouard M.D.   On: 04/23/2015 16:21   Dg Chest Port 1 View  04/23/2015   CLINICAL DATA:   Chest pain and shortness of breath for 1 day.  EXAM: PORTABLE CHEST - 1 VIEW  COMPARISON:  02/23/2015  FINDINGS: Left-sided pacemaker remains in place. Cardiomegaly is unchanged. There is vascular congestion and perihilar peribronchial cuffing suggestive of pulmonary edema. No confluent airspace disease to suggest pneumonia. No large pleural effusion or pneumothorax.  IMPRESSION: Vascular congestion and pulmonary edema, findings most consistent with CHF. Cardiomegaly is stable.   Electronically Signed   By: Jeb Levering M.D.   On: 04/23/2015 05:55    PHYSICAL EXAM General: NAD Neck: JVP 7 cm, no thyromegaly or thyroid nodule.  Lungs: Slight crackles at bases. CV: Nondisplaced PMI.  Heart regular S1/S2, no S3/S4, no murmur.  No peripheral edema.  No carotid bruit.  Normal pedal pulses.  Abdomen: Soft, nontender, no hepatosplenomegaly, no distention.  Neurologic: Alert and oriented x 3.  Psych: Normal affect. Extremities: No clubbing or cyanosis.   TELEMETRY: Reviewed telemetry pt in A-V sequential pacing  ASSESSMENT AND PLAN: 59 yo with history of CAD/ischemic cardiomyopathy, CKD, and recent admission with elevated LFTs presented with severe dyspnea and chest pain.  Found to have acute pulmonary edema at admission.  1. Acute/chronic systolic CHF: Ischemic cardiomyopathy, last EF 25-30%. St Jude CRT-D.  Patient was admitted with severe dyspnea, hypoxemia, pulmonary edema on CXR. He was diuresed with IV Lasix initially and breathing improved significantly. Still requiring oxygen by nasal cannula but feeling better and repeat CXR showed clearing of edema. ?Cause of acute exacerbation.  Per wife, he was gradually short of breath and wheezing, then acutely worse. Initially thought to be in atrial fibrillation at admission but interrogation of device shows likely ST with PACs.  Rise in troponin concerning for ACS.  This morning, co-ox 78% and CVP 7.  Creatinine stable.  Echo with EF 35%, stable  pattern of wall motion abnormalities.  - Will repeat CXR and transition to po Lasix.   - Continue to follow CVP/co-ox.   - BP remains soft, holding ACEI and Toprol XL.   - Continue qod digoxin, level is ok.  - out of bed to ambulate.  2. Atrial fibrillation: He was taken off amiodarone recently for ?amiodarone toxicity. He was admitted in 4/16 with elevated LFTs, and CT showed diffuse high attenuation liver density that could be suggestive of a number of disease processes, one of which could be amiodarone liver toxicity. Labs tests were negative for hemochromatosis and Wilson's disease. He is a-paced today.  Interrogation of device here showed no significant atrial fibrillation.   - Restart  Eliquis today.  GI has seen, doubt acute bleeding.   - Will restart amiodarone 200 mg daily.  Transaminases now normal with elevation in bilirubin.  GI evaluated, think that rapid rise and fall in LFTs last admission may have been due to gallstone, now passed.  LFTs were cholestatic.   3. CAD: NSTEMI with rise in troponin to 3.72 => 3.11 => 4.29 => 2.13. He initially had severe dyspnea but later had some chest tightness. He is currently chest pain-free and has been so in the hospital. Cannot totally rule out demand ischemia in setting of hypoxemia/CHF and anemia, but troponin is significantly elevated. Echo this admission shows no change in wall motion abnormality pattern.   - Given lack of chest pain and elevated creatinine with no significant change on echo, I think best course will be evaluation by Cardiolite now that he is stable.  Will arrange for this tomorrow morning.  If no ischemia on Cardiolite (scar on 3/16 study), then would not do cath.  - Restarted Crestor.  - Repeat troponin this morning.  4. CKD: Creatinine is at baseline.  5. Anemia: Patient's hemoglobin has been low long-term, significant fall this admission.  Has h/o rectal cancer but this has been stable.  No overt bleeding.  He is  not microcytic.  GI evaluated, doubt acute bleed (think chronic disease/renal disease).   - Appropriate increase in hemoglobin with PRBCs.  6. Thrombocytopenia: 50% plt drop after starting heparin.  Not typical time course for HIT but has had heparin exposure relatively recently (3/16, but not in last month).  Off heparin, HIT antibody pending.  7. Ambulate, transfer to step down.    35 minutes critical care time.   Loralie Champagne 04/25/2015 8:13 AM

## 2015-04-25 NOTE — Progress Notes (Signed)
[place] for apixaban Indication: atrial fibrillation  No Known Allergies  Patient Measurements: Height: 5\' 10"  (177.8 cm) Weight: 195 lb 5.2 oz (88.6 kg) IBW/kg (Calculated) : 73 Heparin Dosing Weight: 85kg  Vital Signs: Temp: 98.2 F (36.8 C) (05/20 0800) Temp Source: Oral (05/20 0800) BP: 83/50 mmHg (05/20 0800) Pulse Rate: 74 (05/20 0904)  Labs:  Recent Labs  04/23/15 0503  04/23/15 1250 04/23/15 1800 04/23/15 2330 04/24/15 0420 04/24/15 0600 04/24/15 0745 04/25/15 0430 04/25/15 0800  HGB 9.3*  --   --   --   --  7.1* 7.3*  --   --  9.5*  HCT 30.0*  --   --   --   --  22.5* 22.2*  --   --  29.3*  PLT 229  --   --   --   --  134* 126*  --   --  135*  APTT  --   --  35  --   --  63*  --   --   --   --   HEPARINUNFRC  --   --  >2.20*  --   --  >2.20*  --   --   --   --   CREATININE 2.37*  --   --   --   --  2.39*  --   --  2.34*  --   TROPONINI 0.04*  < >  --  4.29* 3.11*  --   --  2.13*  --   --   < > = values in this interval not displayed.  Estimated Creatinine Clearance: 37.6 mL/min (by C-G formula based on Cr of 2.34).   Medical History: Past Medical History  Diagnosis Date  . Essential hypertension, benign   . CAD (coronary artery disease)     a. BMS to LAD 2001 at [facility] b. PTCA/atherectomy ramus and BMS to LAD 2009  . Chronic systolic heart failure   . GERD (gastroesophageal reflux disease)   . Paroxysmal atrial fibrillation     a. on amiodarone, digoxin and Eliquis  . Adenocarcinoma of rectum     a. 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 Prostate cancer     a. s/p seed implants with chemo and radiation  . Biventricular ICD (implantable cardioverter-defibrillator) in place 2009    upgrade  . TIA (transient ischemic attack)   . OSA on CPAP   . HLD (hyperlipidemia)   . Orthostatic hypotension   . Dizziness     a. chronic. Admission for this 07/18/2014  . History of falling July 2015    due to dizziness from medications  .  Ischemic cardiomyopathy     EF 18% by nuclear study 2016, multiple myocardial infarctions in past    . Chronic kidney disease, stage IV (severe) 12/11/2013  . DM type 2, uncontrolled, with renal complications 7/90/2409  . Obesity 12/12/2013  . Hematuria 03/2015   Assessment: 59 year old with history of ischemic cardiomyopathy, CAD s/p BMS LAD in 2001, PTCA ramus and BMS LAD in 2009, Cardiolite (3/16) with EF 18%, no ischemia, prior anterior, apical and inferior infarctionBiV ICD upgrade 2009, paroxysmal atrial fibrillation, CKD, and recent traumatic [facility] 1/16.  Patient admitted for volume overload/sob, found to have bump in CE's. Hx of afib on apixaban with dose given in ED this am and noted compliance at home. Transitioned to IV heparin 5/18. Off heparin 5/19 for possible GIB and drop in hgb  GI does not feel strongly  about GIB, hgb up to 9.5 after transfusion yesterday. No overt bleeding noted. Heparin held yesterday, to restart apixaban this morning. Still appropriate for 5mg  bid. Pltc now trending back up 126>>135. HIT panel sent yesterday.  Goal of Therapy:  Monitor platelets by anticoagulation protocol: Yes   Plan:  Restart apixaban 5mg  bid HIT panel   Erin Hearing PharmD., BCPS Clinical Pharmacist Pager [phone] 04/25/2015 9:08 AM

## 2015-04-26 ENCOUNTER — Inpatient Hospital Stay (HOSPITAL_COMMUNITY): Payer: Medicare Other

## 2015-04-26 LAB — GLUCOSE, CAPILLARY
GLUCOSE-CAPILLARY: 141 mg/dL — AB (ref 65–99)
Glucose-Capillary: 156 mg/dL — ABNORMAL HIGH (ref 65–99)
Glucose-Capillary: 222 mg/dL — ABNORMAL HIGH (ref 65–99)
Glucose-Capillary: 218 mg/dL — ABNORMAL HIGH (ref 65–99)

## 2015-04-26 LAB — CBC
HEMATOCRIT: 30.2 % — AB (ref 39.0–52.0)
Hemoglobin: 9.7 g/dL — ABNORMAL LOW (ref 13.0–17.0)
MCH: 29 pg (ref 26.0–34.0)
MCHC: 32.1 g/dL (ref 30.0–36.0)
MCV: 90.1 fL (ref 78.0–100.0)
Platelets: 156 10*3/uL (ref 150–400)
RBC: 3.35 MIL/uL — ABNORMAL LOW (ref 4.22–5.81)
RDW: 15.2 % (ref 11.5–15.5)
WBC: 5.7 10*3/uL (ref 4.0–10.5)

## 2015-04-26 LAB — BASIC METABOLIC PANEL
Anion gap: 9 (ref 5–15)
BUN: 30 mg/dL — ABNORMAL HIGH (ref 6–20)
CHLORIDE: 97 mmol/L — AB (ref 101–111)
CO2: 26 mmol/L (ref 22–32)
CREATININE: 2.13 mg/dL — AB (ref 0.61–1.24)
Calcium: 8.6 mg/dL — ABNORMAL LOW (ref 8.9–10.3)
GFR, EST AFRICAN AMERICAN: 37 mL/min — AB (ref >60)
GFR, EST NON AFRICAN AMERICAN: 32 mL/min — AB (ref >60)
Glucose, Bld: 158 mg/dL — ABNORMAL HIGH (ref 65–99)
POTASSIUM: 3.5 mmol/L (ref 3.5–5.1)
SODIUM: 132 mmol/L — AB (ref 135–145)

## 2015-04-26 LAB — CARBOXYHEMOGLOBIN
CARBOXYHEMOGLOBIN: 1.5 % (ref 0.5–1.5)
Methemoglobin: 1.2 % (ref 0.0–1.5)
O2 Saturation: 62.6 %
Total hemoglobin: 10.3 g/dL — ABNORMAL LOW (ref 13.5–18.0)

## 2015-04-26 MED ORDER — REGADENOSON 0.4 MG/5ML IV SOLN
0.4000 mg | Freq: Once | INTRAVENOUS | Status: AC
  Administered 2015-04-26: 0.4 mg via INTRAVENOUS
  Filled 2015-04-26: qty 5

## 2015-04-26 MED ORDER — REGADENOSON 0.4 MG/5ML IV SOLN
INTRAVENOUS | Status: AC
  Administered 2015-04-26: 0.4 mg via INTRAVENOUS
  Filled 2015-04-26: qty 5

## 2015-04-26 MED ORDER — TECHNETIUM TC 99M SESTAMIBI GENERIC - CARDIOLITE
10.0000 | Freq: Once | INTRAVENOUS | Status: AC | PRN
Start: 2015-04-26 — End: 2015-04-26
  Administered 2015-04-26: 10 via INTRAVENOUS

## 2015-04-26 MED ORDER — TECHNETIUM TC 99M SESTAMIBI - CARDIOLITE
30.0000 | Freq: Once | INTRAVENOUS | Status: AC | PRN
  Administered 2015-04-26: 30 via INTRAVENOUS

## 2015-04-26 MED ORDER — HYDROCODONE-ACETAMINOPHEN 5-325 MG PO TABS
1.0000 | ORAL_TABLET | Freq: Four times a day (QID) | ORAL | Status: DC | PRN
  Administered 2015-04-26: 1 via ORAL
  Filled 2015-04-26: qty 1

## 2015-04-26 MED ORDER — ISOSORB DINITRATE-HYDRALAZINE 20-37.5 MG PO TABS
0.5000 | ORAL_TABLET | Freq: Three times a day (TID) | ORAL | Status: DC
  Administered 2015-04-26 – 2015-04-27 (×3): 0.5 via ORAL
  Filled 2015-04-26 (×9): qty 0.5

## 2015-04-26 NOTE — Progress Notes (Signed)
Patient came the floor at 1220, alert oriented ,denies pain, no c/o shortness of breath, v/s stable, AV pace on the monitor in 70. Will continue to monitor patient.

## 2015-04-26 NOTE — Progress Notes (Signed)
Pt. Refused cpap. RT informed pt. To notify if he changes his mind. 

## 2015-04-26 NOTE — Progress Notes (Signed)
Patient ID: [name], male   DOB: [birth year], 59 y.o.   MRN: [id number] P  SUBJECTIVE:    Switched to po lasix yesterday. Feels fine. CVP 3 today. Weight down another 2 pounds. Creatinine continues to improve. Now 2.1.   Had Myoview this am. Images pending.    Hgb stable after transfusion.  Remains in NSR. Amio restarted.   Scheduled Meds: . amiodarone  200 mg Oral Daily  . apixaban  5 mg Oral BID  . aspirin EC  81 mg Oral Daily  . digoxin  125 mcg Oral QODAY  . feeding supplement (ENSURE ENLIVE)  237 mL Oral BID BM  . ferumoxytol  510 mg Intravenous Weekly  . furosemide  40 mg Oral Daily  . insulin aspart  0-15 Units Subcutaneous TID WC  . insulin aspart  0-5 Units Subcutaneous QHS  . isosorbide dinitrate  15 mg Oral BID  . pantoprazole  40 mg Oral Daily  . ranolazine  500 mg Oral BID  . rosuvastatin  40 mg Oral q1800  . sodium chloride  10-40 mL Intracatheter Q12H  . sodium chloride  3 mL Intravenous Q12H   Continuous Infusions:  PRN Meds:.sodium chloride, fluticasone, HYDROcodone-acetaminophen, nitroGLYCERIN, promethazine, sodium chloride    Filed Vitals:   04/26/15 0957 04/26/15 0959 04/26/15 1001 04/26/15 1003  BP: 117/76 104/63 105/64 94/65  Pulse: 73 73    Temp:      TempSrc:      Resp: 20 20 20 20   Height:      Weight:      SpO2:        Intake/Output Summary (Last 24 hours) at 04/26/15 1123 Last data filed at 04/26/15 0500  Gross per 24 hour  Intake    583 ml  Output   1375 ml  Net   -792 ml    LABS: Basic Metabolic Panel:  Recent Labs  04/24/15 0420 04/25/15 0430 04/26/15 0447  NA 135 134* 132*  K 3.7 3.5 3.5  CL 99* 98* 97*  CO2 27 27 26   GLUCOSE 222* 171* 158*  BUN 27* 27* 30*  CREATININE 2.39* 2.34* 2.13*  CALCIUM 8.3* 8.5* 8.6*  MG 1.7  --   --    Liver Function Tests:  Recent Labs  04/25/15 0430  AST 19  ALT 9*  ALKPHOS 67  BILITOT 2.0*  PROT 6.7  ALBUMIN 2.2*   No results for input(s): LIPASE, AMYLASE in the last  72 hours. CBC:  Recent Labs  04/24/15 0420 04/24/15 0600  04/25/15 0800 04/26/15 0447  WBC 8.0 8.4  --  7.1 5.7  NEUTROABS 6.1 6.3  --   --   --   HGB 7.1* 7.3*  --  9.5* 9.7*  HCT 22.5* 22.2*  < > 29.3* 30.2*  MCV 91.5 91.7  --  89.3 90.1  PLT 134* 126*  --  135* 156  < > = values in this interval not displayed. Cardiac Enzymes:  Recent Labs  04/23/15 1800 04/23/15 2330 04/24/15 0745  TROPONINI 4.29* 3.11* 2.13*   BNP: Invalid input(s): POCBNP D-Dimer: No results for input(s): DDIMER in the last 72 hours. Hemoglobin A1C: No results for input(s): HGBA1C in the last 72 hours. Fasting Lipid Panel: No results for input(s): CHOL, HDL, LDLCALC, TRIG, CHOLHDL, LDLDIRECT in the last 72 hours. Thyroid Function Tests: No results for input(s): TSH, T4TOTAL, T3FREE, THYROIDAB in the last 72 hours.  Invalid input(s): FREET3 Anemia Panel:  Recent Labs  04/24/15 0745  VITAMINB12 221  FERRITIN 228  TIBC 340  IRON 17*    RADIOLOGY: Ct Abdomen Pelvis Wo Contrast  04/05/2015   CLINICAL DATA:  LFTs improved. No significant complaints today. Urine is still dark in color. Passed clots. History of colostomy.  EXAM: CT ABDOMEN AND PELVIS WITHOUT CONTRAST  TECHNIQUE: Multidetector CT imaging of the abdomen and pelvis was performed following the standard protocol without IV contrast.  COMPARISON:  02/07/2015  FINDINGS: Lower chest: Pacing leads and coronary stents. Small bilateral pleural effusions associated with bibasilar atelectasis. No pulmonary nodules.  Upper abdomen: The noncontrast appearance of the liver is high attenuation. Liver is normal in size. No focal lesions are identified. Gallbladder is present a contains probable small calcified gallstone. The spleen is normal in appearance. No focal abnormality identified within the pancreas, adrenal glands, or kidneys.  Gastrointestinal tract: The stomach and small bowel loops are normal in appearance. Left mid abdominal colostomy.  There is soft tissue in the region of the rectum possibly representing a rectal stump and unchanged. No evidence for bowel obstruction. The appendix is well seen and has a normal appearance.  Pelvis: There is no free pelvic fluid. Seeds versus clips within the prostate gland. Urinary bladder is distended. Adjacent to the rectum in the region of the ischiorectal fossa there is a small soft tissue nodule measuring 2.3 x 2.6 x 2.7 cm. Findings are suspicious for recurrence of rectal cancer.  Retroperitoneum: No retroperitoneal or mesenteric adenopathy. No evidence for aortic aneurysm.  Abdominal wall: Left mid abdominal ostomy site is unremarkable. There is mild dependent body wall edema.  Osseous structures: Lower thoracic and lumbar spondylosis. No suspicious lytic or blastic lesions are identified.  IMPRESSION: 1. Diffusely high attenuation liver density without focal lesion. Findings are suspicious for heavy metal deposition. Considerations include hemochromatosis, hemosiderosis, copper deposition from [place] disease. Glycogen storage disease, amiodarone toxicity and gold therapy can also cause this appearance. 2. Left mid abdominal colostomy is unremarkable in appearance. 3. 2.7 cm nodule in the right perirectal region, suspicious for recurrent cancer.   Electronically Signed   By: Nolon Nations M.D.   On: 04/05/2015 18:01   Nm Hepatobiliary Liver Func  04/04/2015   CLINICAL DATA:  Chronic cholecystitis  EXAM: NUCLEAR MEDICINE HEPATOBILIARY IMAGING  TECHNIQUE: Sequential images of the abdomen were obtained out to 60 minutes following intravenous administration of radiopharmaceutical.  RADIOPHARMACEUTICALS:  5.3 mCi [place] IA-16P Choletec  COMPARISON:  None.  FINDINGS: Mild delay in clearance of blood pool. Uptake in the liver is homogeneous and adequate. The central biliary tree and bowel is seen by 10 minutes. The gallbladder was not visualized at 1 hour and 3.6 mg of morphine was given intravenously.  The gallbladder subsequently filled.  IMPRESSION: 1. Patent cystic duct with delayed gallbladder filling. 2. Patent common bile duct.   Electronically Signed   By: Monte Fantasia M.D.   On: 04/04/2015 16:18   US Abdomen Complete  04/03/2015   CLINICAL DATA:  Abdominal pain, stage IV renal disease.  EXAM: ULTRASOUND ABDOMEN COMPLETE  COMPARISON:  CT 02/07/2015  FINDINGS: Gallbladder: Gallbladder is contracted and thick-walled. Several gallstones are noted measuring up to 5 mm. There some debris within the gallbladder. Negative sonographic Murphy's sign.  Common bile duct: Diameter: Upper limits of normal at 6 mm.  Liver: No focal lesion identified. Within normal limits in parenchymal echogenicity.  IVC: Within normal limits  Pancreas: Visualized portion unremarkable.  Spleen: Mildly enlarged spleen with a calculated volume of 657 cubic  cm.  Right Kidney: Length: 11.2 cm. Echogenicity within normal limits. No mass or hydronephrosis visualized.  Left Kidney: Length: 11.4 cm. Echogenicity within normal limits. No mass or hydronephrosis visualized.  Abdominal aorta: No aneurysm visualized.  Other findings: Small right pleural effusion.  IMPRESSION: 1. Contracted gallbladder with wall thickening and gallstones suggests chronic cholecystitis. Negative sonographic Murphy's sign. 2. Common bile duct upper limits of normal. 3. Mild splenomegaly. 4. Small right pleural effusion.   Electronically Signed   By: Suzy Bouchard M.D.   On: 04/03/2015 21:09   Dg Chest Port 1 View  04/25/2015   CLINICAL DATA:  59 year old male with congestive heart failure, cough. Initial encounter.  EXAM: PORTABLE CHEST - 1 VIEW  COMPARISON:  04/23/2015 and earlier.  FINDINGS: Portable AP semi upright view at 0804 hours. Stable lung volumes. Stable right PICC line and left chest 3 lead cardiac AICD. Regression of perihilar patchy and interstitial opacity bilaterally. No pneumothorax. No pleural effusion identified. No areas of worsening  ventilation. Stable cardiac size and mediastinal contours.  IMPRESSION: Further regression of pulmonary edema. No new cardiopulmonary abnormality identified.   Electronically Signed   By: Genevie Ann M.D.   On: 04/25/2015 08:22   Dg Chest Port 1 View  04/23/2015   CLINICAL DATA:  PICC line placement.  CHF.  EXAM: PORTABLE CHEST - 1 VIEW  COMPARISON:  Film at 0513 hr  FINDINGS: Right upper extremity PICC line is been placed with the tip in the distal SVC. Degree of pulmonary edema appears slightly improved compared to the prior study. Biventricular pacing/ ICD device shows stable appearance. There is stable mild cardiac enlargement. No significant pleural fluid. No pneumothorax.  IMPRESSION: 1. PICC line tip and distal SVC. 2. Interval decrease in pulmonary edema.   Electronically Signed   By: Aletta Edouard M.D.   On: 04/23/2015 16:21   Dg Chest Port 1 View  04/23/2015   CLINICAL DATA:  Chest pain and shortness of breath for 1 day.  EXAM: PORTABLE CHEST - 1 VIEW  COMPARISON:  02/23/2015  FINDINGS: Left-sided pacemaker remains in place. Cardiomegaly is unchanged. There is vascular congestion and perihilar peribronchial cuffing suggestive of pulmonary edema. No confluent airspace disease to suggest pneumonia. No large pleural effusion or pneumothorax.  IMPRESSION: Vascular congestion and pulmonary edema, findings most consistent with CHF. Cardiomegaly is stable.   Electronically Signed   By: Jeb Levering M.D.   On: 04/23/2015 05:55    PHYSICAL EXAM General: NAD Neck: JVP 3 cm, no thyromegaly or thyroid nodule.  Lungs: Slight crackles at bases. CV: Nondisplaced PMI.  Heart regular S1/S2, no S3/S4, no murmur.  No peripheral edema.  No carotid bruit.  Normal pedal pulses.  Abdomen: Soft, nontender, no hepatosplenomegaly, no distention.  Neurologic: Alert and oriented x 3.  Psych: Normal affect. Extremities: No clubbing or cyanosis.   TELEMETRY: Reviewed telemetry pt in A-V sequential  pacing  ASSESSMENT AND PLAN: 59 yo with history of CAD/ischemic cardiomyopathy, CKD, and recent admission with elevated LFTs presented with severe dyspnea and chest pain.  Found to have acute pulmonary edema at admission.  1. Acute/chronic systolic CHF: Ischemic cardiomyopathy, last EF 25-30%. St Jude CRT-D.  Patient was admitted with severe dyspnea, hypoxemia, pulmonary edema on CXR. He was diuresed with IV Lasix initially and breathing improved significantly. Still requiring oxygen by nasal cannula but feeling better and repeat CXR showed clearing of edema. ?Cause of acute exacerbation.  Per wife, he was gradually short of breath and wheezing,  then acutely worse. Initially thought to be in atrial fibrillation at admission but interrogation of device shows likely ST with PACs.  Rise in troponin concerning for ACS vs HF.  This morning, co-ox 63% and CVP 3.  Creatinine improving slowly.  Echo with EF 35%, stable pattern of wall motion abnormalities.  - Continue po Lasix.  Will decrease to once per day. Can increase as needed.  - BP 95-115 b-blocker and ACE have been on hold. Creatinine has worsened significantly over past 6 months. Will switch to bidil. Start 1/2 tab tid.  - Continue qod digoxin, level is ok.  - Transfer tele 2. Atrial fibrillation: He was taken off amiodarone recently for ?amiodarone toxicity. He was admitted in 4/16 with elevated LFTs, and CT showed diffuse high attenuation liver density that could be suggestive of a number of disease processes, one of which could be amiodarone liver toxicity. Labs tests were negative for hemochromatosis and Wilson's disease. He is a-paced today.  Interrogation of device here showed no significant atrial fibrillation.   - Eliquis and amio restarted   -  Transaminases now normal with elevation in bilirubin.  GI evaluated, think that rapid rise and fall in LFTs last admission may have been due to gallstone, now passed.  LFTs were cholestatic.    3. CAD: NSTEMI with rise in troponin to 3.72 => 3.11 => 4.29 => 2.13. He initially had severe dyspnea but later had some chest tightness. He is currently chest pain-free and has been so in the hospital. Cannot totally rule out demand ischemia in setting of hypoxemia/CHF and anemia, but troponin is significantly elevated. Echo this admission shows no change in wall motion abnormality pattern.   - Given lack of chest pain and elevated creatinine with no significant change on echo, I think best course will be evaluation by Cardiolite. If no ischemia on Cardiolite (scar on 3/16 study), then would not do cath.  - Myoview done this am. Images pending 4. CKD: Creatinine is at baseline. Renal u/s ok in April 2016 5. Anemia: Patient's hemoglobin has been low long-term, significant fall this admission.  Has h/o rectal cancer but this has been stable.  No overt bleeding.  He is not microcytic.  GI evaluated, doubt acute bleed (think chronic disease/renal disease).   - Appropriate increase in hemoglobin with PRBCs.  6. Thrombocytopenia: 50% plt drop after starting heparin.  Platelets now back up 156k   Can go to tele. Hopefully restart b-blocker soon and get him home. Will need CPX and possibly RHC at some point to see if he is candidate for advanced therapies. Renal dysfunction may be obstacle but was normal last year.   Glori Bickers MD 04/26/2015 11:23 AM

## 2015-04-26 NOTE — Progress Notes (Signed)
RT Note: Pt refuses cpap at this time.

## 2015-04-26 NOTE — Progress Notes (Signed)
Tolerated Myoview well, images pending.  Kerin Ransom PA-C 04/26/2015 10:05 AM

## 2015-04-26 NOTE — Progress Notes (Signed)
Asked nursing to please let patient know Dr. Haroldine Laws will be reviewing his nuclear stress test images in the morning and will discuss further plan with him. Dayna Dunn PA-C

## 2015-04-27 DIAGNOSIS — I214 Non-ST elevation (NSTEMI) myocardial infarction: Secondary | ICD-10-CM | POA: Diagnosis present

## 2015-04-27 DIAGNOSIS — D649 Anemia, unspecified: Secondary | ICD-10-CM | POA: Diagnosis present

## 2015-04-27 LAB — GLUCOSE, CAPILLARY
GLUCOSE-CAPILLARY: 205 mg/dL — AB (ref 65–99)
Glucose-Capillary: 171 mg/dL — ABNORMAL HIGH (ref 65–99)

## 2015-04-27 MED ORDER — FUROSEMIDE 40 MG PO TABS: 40.0000 mg | ORAL_TABLET | Freq: Every day | ORAL | Status: DC

## 2015-04-27 MED ORDER — ENSURE ENLIVE PO LIQD: 237.0000 mL | Freq: Two times a day (BID) | ORAL | Status: DC

## 2015-04-27 MED ORDER — INSULIN ASPART 100 UNIT/ML ~~LOC~~ SOLN
0.0000 [IU] | Freq: Three times a day (TID) | SUBCUTANEOUS | Status: DC
  Administered 2015-04-27: 3 [IU] via SUBCUTANEOUS

## 2015-04-27 MED ORDER — ROSUVASTATIN CALCIUM 40 MG PO TABS: 40.0000 mg | ORAL_TABLET | Freq: Every day | ORAL | Status: DC

## 2015-04-27 MED ORDER — ISOSORB DINITRATE-HYDRALAZINE 20-37.5 MG PO TABS: 0.5000 | ORAL_TABLET | Freq: Three times a day (TID) | ORAL | Status: DC

## 2015-04-27 MED ORDER — AMIODARONE HCL 200 MG PO TABS: 200.0000 mg | ORAL_TABLET | Freq: Every day | ORAL | Status: DC

## 2015-04-27 NOTE — Discharge Instructions (Signed)

## 2015-04-27 NOTE — Discharge Summary (Signed)
Patient ID: [name],  MRN: [id number], DOB/AGE: Aug 24, 1956 59 y.o.  Admit date: 04/23/2015 Discharge date: 04/27/2015  Primary Care Provider: Glo Herring., MD Primary Cardiologist: Dr Deatra Robinson  Discharge Diagnoses Principal Problem:   Acute on chronic systolic congestive heart failure Active Problems:   Ischemic cardiomyopathy-EF 35% 04/24/15 echo   CAD with LCX disease and stents to LAD   Paroxysmal atrial fibrillation   Chronic kidney disease, stage IV (severe)   DM type 2, uncontrolled, with renal complications   Ventricular fibrillation- 02/22/15   BiV ICD (St Jude) gen change 04/10/15   Chronically elevated transaminase level-Amiodarone resumed 04/24/15   NSTEMI- 04/23/15- Myoview scar- no cath   Anemia-transfused 2 units May 2016   History of rectal cancer   Long term current use of anticoagulant therapy   GERD (gastroesophageal reflux disease)   OSA on CPAP   Hyperlipidemia   Cystitis with hematuria-April 2016  Procedure: Echo 04/24/15                      Myoview 04/26/15  Hospital Course:  59 y/o complicated pt of Dr Claris Gladden, followed in the CHF clinic. He has a history of CAD with remote PCI. He has an ICM and had an ICD implanted in 2002 with an upgrade to a BiV device in 2009. He was admitted to [facility] 02/22/15 with CHF and had a VF arrest in the ED, terminated by an ICD shock. He was transferred to [facility] and seen by the EP service. He had chronically been on Amiodarone and this was increased to 200 mg BID and the pt was discharged 02/28/15. He saw Dr Lovena Le in the office and was stable 03/19/15. His Amiodarone was decreased then to 200 mg daily. He was seen in the device clinic 03/25/15 and his device was noted to be at EOL. He was then admitted 04/03/15 with nausea and hematuria. He was diagnosed with transaminitis and cystitis. His Amiodarone was discontinued and he was discharged 04/06/15.            On 04/10/15 he was admitted for an elective BiV ICD generator change  which went without complication. He was admitted again 04/23/15 with acute on chronic CHF and respiratory failure. During this admission he ruled in for a NSTEMI- Troponin 4.29. He has chronic stage 4 CRI and it was decided to obtain a Myoview before considering a cath. Myoview done 04/26/15 showed scar and the plan is for medical Rx. Also during this hospitalization he was seen in consult by the GI service. Dr Carlean Purl felt we could resume his Amiodarone with plans to follow his LFTs. He was seen by Dr Rayann Heman 04/27/15 and felt to be stable for discharge. He will need a TOC 7 day f/u in the CHF clinic or in the Flex clinic.   Discharge Vitals:  Blood pressure 100/62, pulse 68, temperature 98.1 F (36.7 C), temperature source Oral, resp. rate 18, height 5\' 10"  (1.778 m), weight 190 lb 9.6 oz (86.456 kg), SpO2 96 %.    Labs: Results for orders placed or performed during the hospital encounter of 04/23/15 (from the past 24 hour(s))  Glucose, capillary     Status: Abnormal   Collection Time: 04/26/15  4:32 PM  Result Value Ref Range   Glucose-Capillary 222 (H) 65 - 99 mg/dL   Comment 1 Notify RN   Glucose, capillary     Status: Abnormal   Collection Time: 04/26/15  9:36 PM  Result Value Ref Range   Glucose-Capillary 218 (H) 65 - 99 mg/dL  Glucose, capillary     Status: Abnormal   Collection Time: 04/27/15  5:50 AM  Result Value Ref Range   Glucose-Capillary 171 (H) 65 - 99 mg/dL  Glucose, capillary     Status: Abnormal   Collection Time: 04/27/15 11:07 AM  Result Value Ref Range   Glucose-Capillary 205 (H) 65 - 99 mg/dL   Comment 1 Notify RN     Disposition:      Follow-up Information    Follow up with Loralie Champagne, MD.   Specialty:  Cardiology   Why:  office will contact you for an appointment   Contact information:   [address] [place] [address] [phone]       Discharge Medications:    Medication List    STOP taking these medications         enalapril 10 MG tablet  Commonly known as:  VASOTEC     glipiZIDE 5 MG tablet  Commonly known as:  GLUCOTROL     isosorbide dinitrate 30 MG tablet  Commonly known as:  ISORDIL     metoprolol succinate 25 MG 24 hr tablet  Commonly known as:  TOPROL-XL      TAKE these medications        amiodarone 200 MG tablet  Commonly known as:  PACERONE  Take 1 tablet (200 mg total) by mouth daily.     apixaban 5 MG Tabs tablet  Commonly known as:  ELIQUIS  Take 5 mg by mouth 2 (two) times daily.     aspirin EC 81 MG tablet  Take 81 mg by mouth daily.     co-enzyme Q-10 50 MG capsule  Take 50 mg by mouth every morning.     digoxin 0.125 MG tablet  Commonly known as:  LANOXIN  Take 1 tablet by mouth every other day.     feeding supplement (ENSURE ENLIVE) Liqd  Take 237 mLs by mouth 2 (two) times daily between meals.     fluticasone 50 MCG/ACT nasal spray  Commonly known as:  FLONASE  Place 1 spray into both nostrils daily as needed for allergies.     furosemide 40 MG tablet  Commonly known as:  LASIX  Take 1 tablet (40 mg total) by mouth daily.     isosorbide-hydrALAZINE 20-37.5 MG per tablet  Commonly known as:  BIDIL  Take 0.5 tablets by mouth 3 (three) times daily.     nitroGLYCERIN 0.4 MG/SPRAY spray  Commonly known as:  NITROLINGUAL  Place 1 spray under the tongue every 5 (five) minutes as needed. angina     pantoprazole 40 MG tablet  Commonly known as:  PROTONIX  Take 1 tablet (40 mg total) by mouth daily.     ranolazine 500 MG 12 hr tablet  Commonly known as:  RANEXA  Take 1 tablet (500 mg total) by mouth 2 (two) times daily.     rosuvastatin 40 MG tablet  Commonly known as:  CRESTOR  Take 1 tablet (40 mg total) by mouth daily at 6 PM.         Duration of Discharge Encounter: Greater than 30 minutes including physician time.  Angelena Form PA-C 04/27/2015 12:04 PM

## 2015-04-27 NOTE — Progress Notes (Signed)
Pt discharged home given discharge instructions to wife at the request of the patient. Questions answered. Taken out via wheelchair, [employer] drsg clean dry and intact.

## 2015-04-27 NOTE — Care Management Note (Signed)
Case Management Note  Patient Details  Name: KEO SCHIRMER MRN: [id number] Date of Birth: September 23, 1956  Subjective/Objective:                    Action/Plan:   Expected Discharge Date:                  Expected Discharge Plan:  [place]  In-House Referral:     Discharge planning Services  CM Consult  Post Acute Care Choice:  Resumption of Svcs/PTA Provider Choice offered to:     DME Arranged:    DME Agency:     HH Arranged:  RN [place] Agency:  [place]  Status of Service:     Medicare Important Message Given:    Date Medicare IM Given:    Medicare IM give by:    Date Additional Medicare IM Given:    Additional Medicare Important Message give by:     If discussed at [place] of Stay Meetings, dates discussed:    Additional Comments: CM requested Resumption of HHRN orders.  [facility] aware.   Dellie Catholic, RN 04/27/2015, 1:33 PM

## 2015-04-27 NOTE — Progress Notes (Signed)
SUBJECTIVE: The patient is doing well today.  At this time, he denies chest pain, shortness of breath, or any new concerns.  Wants to go home.  Marland Kitchen amiodarone  200 mg Oral Daily  . apixaban  5 mg Oral BID  . aspirin EC  81 mg Oral Daily  . digoxin  125 mcg Oral QODAY  . feeding supplement (ENSURE ENLIVE)  237 mL Oral BID BM  . ferumoxytol  510 mg Intravenous Weekly  . furosemide  40 mg Oral Daily  . insulin aspart  0-15 Units Subcutaneous TID WC  . insulin aspart  0-5 Units Subcutaneous QHS  . isosorbide-hydrALAZINE  0.5 tablet Oral TID  . pantoprazole  40 mg Oral Daily  . ranolazine  500 mg Oral BID  . rosuvastatin  40 mg Oral q1800  . sodium chloride  10-40 mL Intracatheter Q12H  . sodium chloride  3 mL Intravenous Q12H      OBJECTIVE: Physical Exam: Filed Vitals:   04/26/15 1200 04/26/15 1421 04/26/15 2101 04/27/15 0531  BP:  108/65 115/59 100/62  Pulse: 78 70 70 68  Temp:  97.5 F (36.4 C) 97.9 F (36.6 C) 98.1 F (36.7 C)  TempSrc:  Oral Oral Oral  Resp:  20 18 18   Height:  5\' 10"  (1.778 m)    Weight:    86.456 kg (190 lb 9.6 oz)  SpO2:  100% 94% 96%    Intake/Output Summary (Last 24 hours) at 04/27/15 1105 Last data filed at 04/27/15 0857  Gross per 24 hour  Intake    960 ml  Output   1250 ml  Net   -290 ml    Telemetry reveals AV paced  GEN- The patient is chronically ill appearing, alert and oriented x 3 today.   Head- normocephalic, atraumatic Eyes-  Sclera clear, conjunctiva pink Ears- hearing intact Oropharynx- clear Neck- supple  Lungs- Clear to ausculation bilaterally, normal work of breathing Heart- Regular rate and rhythm  GI- soft, NT, ND, + BS Extremities- no clubbing, cyanosis, or edema Skin- no rash or lesion Psych- euthymic mood, full affect Neuro- strength and sensation are intact  LABS: Basic Metabolic Panel:  Recent Labs  04/25/15 0430 04/26/15 0447  NA 134* 132*  K 3.5 3.5  CL 98* 97*  CO2 27 26  GLUCOSE 171* 158*    BUN 27* 30*  CREATININE 2.34* 2.13*  CALCIUM 8.5* 8.6*   Liver Function Tests:  Recent Labs  04/25/15 0430  AST 19  ALT 9*  ALKPHOS 67  BILITOT 2.0*  PROT 6.7  ALBUMIN 2.2*   No results for input(s): LIPASE, AMYLASE in the last 72 hours. CBC:  Recent Labs  04/25/15 0800 04/26/15 0447  WBC 7.1 5.7  HGB 9.5* 9.7*  HCT 29.3* 30.2*  MCV 89.3 90.1  PLT 135* 156   RADIOLOGY:   ASSESSMENT AND PLAN:  Principal Problem:   Acute on chronic systolic congestive heart failure Active Problems:   History of rectal cancer   Ischemic cardiomyopathy   Long term current use of anticoagulant therapy   CAD with LCX disease and stents to LAD   Paroxysmal atrial fibrillation   Chronic kidney disease, stage IV (severe)   DM type 2, uncontrolled, with renal complications   GERD (gastroesophageal reflux disease)   OSA on CPAP   Hyperlipidemia   Systolic heart failure   Acute respiratory failure with hypoxia   CHF (congestive heart failure)  1. CHF Stable Continue current medicines at discharge Follow-up  in CHF clinic this week for transition of care appointment Per Dr Haroldine Laws, add coreg 3.125mg  BID  2. CAD/ NSTEMI myoview report is reviewed, not a very helpful report. I have reviewed imaged with Dr Haroldine Laws.  Pt has a large LV with reduced EF and dense scar.  Very little ischemia with Sum score of 0. Given little reversibility and renal failure, Dr Haroldine Laws and I agree that medical management is most appropriate at this time. Add coreg Continue other medicines as currently ordered.  DC to home today Follow closely with CHF/ Dr Aundra Dubin (needs appointment for this week)    Thompson Grayer, MD 04/27/2015 11:05 AM

## 2015-04-28 DIAGNOSIS — I2510 Atherosclerotic heart disease of native coronary artery without angina pectoris: Secondary | ICD-10-CM | POA: Diagnosis not present

## 2015-04-28 DIAGNOSIS — I480 Paroxysmal atrial fibrillation: Secondary | ICD-10-CM | POA: Diagnosis not present

## 2015-04-28 DIAGNOSIS — N184 Chronic kidney disease, stage 4 (severe): Secondary | ICD-10-CM | POA: Diagnosis not present

## 2015-04-28 DIAGNOSIS — E1165 Type 2 diabetes mellitus with hyperglycemia: Secondary | ICD-10-CM | POA: Diagnosis not present

## 2015-04-28 DIAGNOSIS — E1122 Type 2 diabetes mellitus with diabetic chronic kidney disease: Secondary | ICD-10-CM | POA: Diagnosis not present

## 2015-04-28 DIAGNOSIS — I5023 Acute on chronic systolic (congestive) heart failure: Secondary | ICD-10-CM | POA: Diagnosis not present

## 2015-04-28 LAB — AMIODARONE LEVEL
Amiodarone Lvl: 2.3 ug/mL (ref 1.0–2.5)
N-Desethyl-Amiodarone: 1.1 ug/mL (ref 1.0–2.5)

## 2015-04-29 ENCOUNTER — Encounter: Payer: Medicare Other | Admitting: Cardiology

## 2015-04-29 ENCOUNTER — Encounter: Payer: Self-pay | Admitting: Cardiology

## 2015-04-29 LAB — SEROTONIN RELEASE ASSAY (SRA)
SRA .2 IU/mL UFH Ser-aCnc: 1 % (ref 0–20)
SRA 100IU/mL UFH Ser-aCnc: 1 % (ref 0–20)

## 2015-04-29 LAB — HEPARIN INDUCED THROMBOCYTOPENIA PNL: HEPARIN INDUCED PLT AB: 0.521 {OD_unit} — AB (ref 0.000–0.400)

## 2015-04-29 NOTE — Progress Notes (Signed)
     ERROR No Show  

## 2015-04-30 DIAGNOSIS — I509 Heart failure, unspecified: Secondary | ICD-10-CM | POA: Diagnosis not present

## 2015-04-30 DIAGNOSIS — I4891 Unspecified atrial fibrillation: Secondary | ICD-10-CM | POA: Diagnosis not present

## 2015-04-30 DIAGNOSIS — G4731 Primary central sleep apnea: Secondary | ICD-10-CM | POA: Diagnosis not present

## 2015-05-01 ENCOUNTER — Encounter: Payer: Self-pay | Admitting: Nurse Practitioner

## 2015-05-01 ENCOUNTER — Ambulatory Visit (INDEPENDENT_AMBULATORY_CARE_PROVIDER_SITE_OTHER): Payer: Medicare Other | Admitting: Nurse Practitioner

## 2015-05-01 VITALS — BP 98/69 | HR 116 | Temp 97.7°F | Ht 70.0 in | Wt 191.8 lb

## 2015-05-01 DIAGNOSIS — K5900 Constipation, unspecified: Secondary | ICD-10-CM

## 2015-05-01 DIAGNOSIS — R740 Nonspecific elevation of levels of transaminase and lactic acid dehydrogenase [LDH]: Secondary | ICD-10-CM | POA: Diagnosis not present

## 2015-05-01 DIAGNOSIS — R1011 Right upper quadrant pain: Secondary | ICD-10-CM | POA: Diagnosis not present

## 2015-05-01 DIAGNOSIS — I255 Ischemic cardiomyopathy: Secondary | ICD-10-CM | POA: Diagnosis not present

## 2015-05-01 DIAGNOSIS — R7401 Elevation of levels of liver transaminase levels: Secondary | ICD-10-CM | POA: Insufficient documentation

## 2015-05-01 NOTE — Progress Notes (Signed)
Referring Provider: Redmond School, MD Primary Care Physician:  Glo Herring., MD Primary GI:  Dr. Gala Romney  Chief Complaint  Patient presents with  . Follow-up    HPI:   59 year old male presents for follow-up on constipation and right upper quadrant abdominal pain. Patient with a history of stage III rectal carcinoma diagnosed in 2008 status post APR/chemotherapy and radiation therapy. Next due for surveillance colonoscopy 2021. Admits chronic constipation was started on MiraLAX one to 2 times a day as needed. Persistent right upper quadrant pain with ultrasound showing mildly dilated CBD at 8 mm, normal HIDA with EF of 57%, not a candidate for MRI/MRCP due to permanent pacemaker. Plan referral to Dr. Dalbert Batman. Patient was subsequently admitted to the hospital for 28th 2016 for transaminitis. Per discharge summary patient right upper quadrant pain resolved on admission. Suspect passed gallstone versus amiodarone toxicity for his transaminitis. Also recently admitted for CHF exacerbation and was told by cardiology that he needs to try and hold off on any surgery for now.  Today he states his abdominal pain has subsided. His constipation has also subsided. He is taking a reimen of Miralax and stool softeners. Has a bowel movement a few times a week which is working well for him. Denies N/V. Denies any further jaundice of scleral icterus since discharge from the hospital. Denies blood in stool. Denies any other upper or lower GI symptoms.  Past Medical History  Diagnosis Date  . Essential hypertension, benign   . CAD (coronary artery disease)     a. BMS to LAD 2001 at [facility] b. PTCA/atherectomy ramus and BMS to LAD 2009  . Chronic systolic heart failure   . GERD (gastroesophageal reflux disease)   . Paroxysmal atrial fibrillation     a. on amiodarone, digoxin and Eliquis  . Adenocarcinoma of rectum     a. 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 Prostate cancer     a. s/p seed implants with chemo and radiation   . Biventricular ICD (implantable cardioverter-defibrillator) in place 2009    upgrade  . TIA (transient ischemic attack)   . OSA on CPAP   . HLD (hyperlipidemia)   . Orthostatic hypotension   . Dizziness     a. chronic. Admission for this 07/18/2014  . History of falling July 2015    due to dizziness from medications  . Ischemic cardiomyopathy     EF 18% by nuclear study 2016, multiple myocardial infarctions in past    . Chronic kidney disease, stage IV (severe) 12/11/2013  . DM type 2, uncontrolled, with renal complications 8/67/6195  . Obesity 12/12/2013  . Hematuria 03/2015    Past Surgical History  Procedure Laterality Date  . Internal defibrillator and pacemaker  2002  . Abdominal and perineal resection of rectum with total mesorectal excision  10/04/2007  . Colonoscopy  09/14/2011    Dr. Gala Romney: via colostomy, Single pedunculated benign inflammatory polyp. Due for surveillance Oct 2015  . Colostomy  2008  . Colonoscopy N/A 07/02/2014    Procedure: COLONOSCOPY;  Surgeon: Daneil Dolin, MD;  Location: [facility] ENDO SUITE;  Service: Endoscopy;  Laterality: N/A;  7:30 / COLONOSCOPY THRU COLOSTOMY  . Esophagogastroduodenoscopy N/A 07/02/2014    Procedure: ESOPHAGOGASTRODUODENOSCOPY (EGD);  Surgeon: Daneil Dolin, MD;  Location: [facility] ENDO SUITE;  Service: Endoscopy;  Laterality: N/A;  7:30  . Savory dilation N/A 07/02/2014    Procedure: SAVORY DILATION;  Surgeon: Daneil Dolin, MD;  Location: [facility] ENDO SUITE;  Service: Endoscopy;  Laterality: N/A;  7:Ector dilation N/A 07/02/2014    Procedure: Venia Minks DILATION;  Surgeon: Daneil Dolin, MD;  Location: [facility] END[facility];  Service: Endoscopy;  Laterality: N/A;  7:30  . Portacath placement  06/2007    "removed ~ 1 yr later"  . Cardiac catheterization  08/2001  . Coronary angioplasty with stent placement  2001; ~ 2006    "1 + 1"   . Left heart catheterization with coronary angiogram N/A 07/13/2013    Procedure: LEFT HEART CATHETERIZATION WITH  CORONARY ANGIOGRAM;  Surgeon: Lorretta Harp, MD;  Location: [facility] CATH LAB;  Service: Cardiovascular;  Laterality: N/A;  . Colonoscopy N/A 12/11/2014    Dr. Gala Romney via colostomy. Normal. Repeat in 2021.   Marland Kitchen Esophagogastroduodenoscopy N/A 12/11/2014    [id number] EGD  . Cardiac defibrillator placement  2009    Upgraded to a BiV ICD  . Right heart catheterization N/A 02/24/2015    Procedure: RIGHT HEART CATH;  Surgeon: Jolaine Artist, MD;  Location: [facility] CATH LAB;  Service: Cardiovascular;  Laterality: N/A;  . Ep implantable device N/A 04/10/2015    Procedure: Ppm/Biv Ppm Generator Changeout;  Surgeon: Evans Lance, MD;  Location: [facility] INVASIVE CV LAB CUPID;  Service: Cardiovascular;  Laterality: N/A;    Current Outpatient Prescriptions  Medication Sig Dispense Refill  . amiodarone (PACERONE) 200 MG tablet Take 1 tablet (200 mg total) by mouth daily. 90 tablet 11  . apixaban (ELIQUIS) 5 MG TABS tablet Take 5 mg by mouth 2 (two) times daily.    Marland Kitchen aspirin EC 81 MG tablet Take 81 mg by mouth daily.    Marland Kitchen co-enzyme Q-10 50 MG capsule Take 50 mg by mouth every morning.     . digoxin (LANOXIN) 0.125 MG tablet Take 1 tablet by mouth every other day.  0  . feeding supplement, ENSURE ENLIVE, (ENSURE ENLIVE) LIQD Take 237 mLs by mouth 2 (two) times daily between meals. 237 mL 12  . fluticasone (FLONASE) 50 MCG/ACT nasal spray Place 1 spray into both nostrils daily as needed for allergies.     . furosemide (LASIX) 40 MG tablet Take 1 tablet (40 mg total) by mouth daily. 30 tablet 11  . isosorbide-hydrALAZINE (BIDIL) 20-37.5 MG per tablet Take 0.5 tablets by mouth 3 (three) times daily. 100 tablet 5  . nitroGLYCERIN (NITROLINGUAL) 0.4 MG/SPRAY spray Place 1 spray under the tongue every 5 (five) minutes as needed. angina (Patient taking differently: Place 1 spray under the tongue every 5 (five) minutes as needed for chest pain (used dose last week 04/2015). angina) 12 g 3  . pantoprazole (PROTONIX) 40 MG tablet  Take 1 tablet (40 mg total) by mouth daily. 30 tablet 6  . ranolazine (RANEXA) 500 MG 12 hr tablet Take 1 tablet (500 mg total) by mouth 2 (two) times daily. 60 tablet 6  . rosuvastatin (CRESTOR) 40 MG tablet Take 1 tablet (40 mg total) by mouth daily at 6 PM. 90 tablet 11   No current facility-administered medications for this visit.    Allergies as of 05/01/2015  . (No Known Allergies)    Family History  Problem Relation Age of Onset  . Colon cancer Mother 50  . Colon cancer Sister 4  . Coronary artery disease Father   . Colon cancer Other     2 cousins, succumbed to illness    History   Social History  . Marital Status: Married    Spouse Name: N/A  . Number of Children: 1  .  Years of Education: N/A   Occupational History  .     Social History Main Topics  . Smoking status: Never Smoker   . Smokeless tobacco: Never Used  . Alcohol Use: No     Comment: Former user 45 years ago  . Drug Use: No  . Sexual Activity: No   Other Topics Concern  . None   Social History Narrative   Married 70 years   1 son, 2 grandkids    Denies alcohol, drugs, tobacco       Review of Systems: General: Negative for anorexia, weight loss, fever, chills, fatigue, weakness. ENT: Negative for hoarseness, difficulty swallowing. CV: Negative for chest pain, angina, palpitations, peripheral edema.  Respiratory: Negative for dyspnea at rest,  cough, wheezing.  GI: See history of present illness. Derm: Negative for rash or itching.  Neuro: Negative for weakness. Endo: Negative for unusual weight change.  Heme: Negative for bruising or bleeding.   Physical Exam: BP 98/69 mmHg  Pulse 116  Temp(Src) 97.7 F (36.5 C)  Ht 5\' 10"  (1.778 m)  Wt 191 lb 12.8 oz (87 kg)  BMI 27.52 kg/m2 General:   Alert and oriented. Pleasant and cooperative. Well-nourished and well-developed.  Head:  Normocephalic and atraumatic. Eyes:  Without icterus, sclera clear and conjunctiva pink.  Ears:   Normal auditory acuity. Cardiovascular:  S1, S2 present without murmurs appreciated. Tachycardic rate. Normal pulses DP noted. Extremities without clubbing or edema. Respiratory:  Clear to auscultation bilaterally. No wheezes, rales, or rhonchi. No distress.  Gastrointestinal:  +BS, soft, non-tender and non-distended. Colostomy bad noted without overt leaking. No HSM noted. No guarding or rebound. No masses appreciated.  Rectal:  Deferred  Neurologic:  Alert and oriented x4;  grossly normal neurologically. Psych:  Alert and cooperative. Normal mood and affect. Heme/Lymph/Immune: No excessive bruising noted.    05/01/2015 8:19 AM

## 2015-05-01 NOTE — Patient Instructions (Signed)
1. Return in 3 months and we can recheck your liver enzymes and the status of your symptoms. 2. Call sooner if any change in her symptoms, worsening symptoms, or any GI concerns.

## 2015-05-01 NOTE — Progress Notes (Signed)
CC'ED TO PCP 

## 2015-05-01 NOTE — Assessment & Plan Note (Signed)
Patient with recent hospital admission proximal 1 month ago for transaminitis. Hospitalist suspected obstructive gallstone subsequently passed versus amiodarone toxicity. Patient with persistent chronic right upper quadrant pain. His liver enzymes drawn approximately 5 days ago show return to baseline for all values except bilirubin which remains slightly elevated. However there has been a persistent trend downward in his family. We'll have him return in 3 months for reevaluation and recheck his liver numbers.

## 2015-05-01 NOTE — Assessment & Plan Note (Signed)
Patient with a history of chronic constipation which is subsequently resolved. Is on an over-the-counter regimen of MiraLAX and stool softeners which she feels works well for him. Denies any blood in his stool. We'll have him return in 3 months for further evaluation of his symptoms progress.

## 2015-05-01 NOTE — Assessment & Plan Note (Signed)
History of persistent, chronic right upper quadrant pain with known gallstone. Patient was considered for possible elective cholecystectomy. However he ended up admitted to the hospital transaminitis and pain resolved on admission, hospitalist felt he may past his gallstone. Today he states he's had no abdominal pain since admission. Cardiology feels he should hold off on elective surgery for this time. We'll have him return in 3 months for further evaluation and status of his symptoms.

## 2015-05-02 DIAGNOSIS — E1165 Type 2 diabetes mellitus with hyperglycemia: Secondary | ICD-10-CM | POA: Diagnosis not present

## 2015-05-02 DIAGNOSIS — I2510 Atherosclerotic heart disease of native coronary artery without angina pectoris: Secondary | ICD-10-CM | POA: Diagnosis not present

## 2015-05-02 DIAGNOSIS — E1122 Type 2 diabetes mellitus with diabetic chronic kidney disease: Secondary | ICD-10-CM | POA: Diagnosis not present

## 2015-05-02 DIAGNOSIS — N184 Chronic kidney disease, stage 4 (severe): Secondary | ICD-10-CM | POA: Diagnosis not present

## 2015-05-02 DIAGNOSIS — I5023 Acute on chronic systolic (congestive) heart failure: Secondary | ICD-10-CM | POA: Diagnosis not present

## 2015-05-02 DIAGNOSIS — I480 Paroxysmal atrial fibrillation: Secondary | ICD-10-CM | POA: Diagnosis not present

## 2015-05-02 LAB — NM MYOCAR MULTI W/SPECT W/WALL MOTION / EF
CHL CUP NUCLEAR SDS: 0
LVDIAVOL: 300 mL
LVSYSVOL: 317 mL
NUC STRESS TID: 1.15
RATE: 0.27
SRS: 36
SSS: 36

## 2015-05-06 ENCOUNTER — Encounter: Payer: Self-pay | Admitting: Internal Medicine

## 2015-05-07 ENCOUNTER — Encounter: Payer: Self-pay | Admitting: Internal Medicine

## 2015-05-07 DIAGNOSIS — N184 Chronic kidney disease, stage 4 (severe): Secondary | ICD-10-CM | POA: Diagnosis not present

## 2015-05-07 DIAGNOSIS — I2510 Atherosclerotic heart disease of native coronary artery without angina pectoris: Secondary | ICD-10-CM | POA: Diagnosis not present

## 2015-05-07 DIAGNOSIS — I5023 Acute on chronic systolic (congestive) heart failure: Secondary | ICD-10-CM | POA: Diagnosis not present

## 2015-05-07 DIAGNOSIS — E1165 Type 2 diabetes mellitus with hyperglycemia: Secondary | ICD-10-CM | POA: Diagnosis not present

## 2015-05-07 DIAGNOSIS — I480 Paroxysmal atrial fibrillation: Secondary | ICD-10-CM | POA: Diagnosis not present

## 2015-05-07 DIAGNOSIS — E1122 Type 2 diabetes mellitus with diabetic chronic kidney disease: Secondary | ICD-10-CM | POA: Diagnosis not present

## 2015-05-08 DIAGNOSIS — E1165 Type 2 diabetes mellitus with hyperglycemia: Secondary | ICD-10-CM | POA: Diagnosis not present

## 2015-05-08 DIAGNOSIS — I255 Ischemic cardiomyopathy: Secondary | ICD-10-CM | POA: Diagnosis not present

## 2015-05-08 DIAGNOSIS — I5023 Acute on chronic systolic (congestive) heart failure: Secondary | ICD-10-CM | POA: Diagnosis not present

## 2015-05-08 DIAGNOSIS — W19XXXD Unspecified fall, subsequent encounter: Secondary | ICD-10-CM | POA: Diagnosis not present

## 2015-05-08 DIAGNOSIS — S066X0D Traumatic subarachnoid hemorrhage without loss of consciousness, subsequent encounter: Secondary | ICD-10-CM | POA: Diagnosis not present

## 2015-05-08 DIAGNOSIS — I2510 Atherosclerotic heart disease of native coronary artery without angina pectoris: Secondary | ICD-10-CM | POA: Diagnosis not present

## 2015-05-08 DIAGNOSIS — Z87440 Personal history of urinary (tract) infections: Secondary | ICD-10-CM | POA: Diagnosis not present

## 2015-05-08 DIAGNOSIS — I480 Paroxysmal atrial fibrillation: Secondary | ICD-10-CM | POA: Diagnosis not present

## 2015-05-08 DIAGNOSIS — Z933 Colostomy status: Secondary | ICD-10-CM | POA: Diagnosis not present

## 2015-05-08 DIAGNOSIS — Z95810 Presence of automatic (implantable) cardiac defibrillator: Secondary | ICD-10-CM | POA: Diagnosis not present

## 2015-05-08 DIAGNOSIS — I214 Non-ST elevation (NSTEMI) myocardial infarction: Secondary | ICD-10-CM | POA: Diagnosis not present

## 2015-05-08 DIAGNOSIS — N184 Chronic kidney disease, stage 4 (severe): Secondary | ICD-10-CM | POA: Diagnosis not present

## 2015-05-08 DIAGNOSIS — E1122 Type 2 diabetes mellitus with diabetic chronic kidney disease: Secondary | ICD-10-CM | POA: Diagnosis not present

## 2015-05-12 DIAGNOSIS — I10 Essential (primary) hypertension: Secondary | ICD-10-CM | POA: Diagnosis not present

## 2015-05-12 DIAGNOSIS — E1165 Type 2 diabetes mellitus with hyperglycemia: Secondary | ICD-10-CM | POA: Diagnosis not present

## 2015-05-12 DIAGNOSIS — E785 Hyperlipidemia, unspecified: Secondary | ICD-10-CM | POA: Diagnosis not present

## 2015-05-13 DIAGNOSIS — E1122 Type 2 diabetes mellitus with diabetic chronic kidney disease: Secondary | ICD-10-CM | POA: Diagnosis not present

## 2015-05-13 DIAGNOSIS — I480 Paroxysmal atrial fibrillation: Secondary | ICD-10-CM | POA: Diagnosis not present

## 2015-05-13 DIAGNOSIS — I5023 Acute on chronic systolic (congestive) heart failure: Secondary | ICD-10-CM | POA: Diagnosis not present

## 2015-05-13 DIAGNOSIS — I214 Non-ST elevation (NSTEMI) myocardial infarction: Secondary | ICD-10-CM | POA: Diagnosis not present

## 2015-05-13 DIAGNOSIS — I2510 Atherosclerotic heart disease of native coronary artery without angina pectoris: Secondary | ICD-10-CM | POA: Diagnosis not present

## 2015-05-13 DIAGNOSIS — N184 Chronic kidney disease, stage 4 (severe): Secondary | ICD-10-CM | POA: Diagnosis not present

## 2015-05-14 ENCOUNTER — Encounter (HOSPITAL_COMMUNITY): Payer: Self-pay

## 2015-05-14 ENCOUNTER — Other Ambulatory Visit: Payer: Self-pay

## 2015-05-14 ENCOUNTER — Ambulatory Visit (HOSPITAL_COMMUNITY)
Admission: RE | Admit: 2015-05-14 | Discharge: 2015-05-14 | Disposition: A | Discharge status: Home / Self Care | Payer: Medicare Other | Source: Ambulatory Visit | Attending: Internal Medicine | Admitting: Internal Medicine

## 2015-05-14 VITALS — BP 106/62 | HR 88 | Wt 191.0 lb

## 2015-05-14 DIAGNOSIS — Z955 Presence of coronary angioplasty implant and graft: Secondary | ICD-10-CM | POA: Diagnosis not present

## 2015-05-14 DIAGNOSIS — N189 Chronic kidney disease, unspecified: Secondary | ICD-10-CM | POA: Diagnosis not present

## 2015-05-14 DIAGNOSIS — Z933 Colostomy status: Secondary | ICD-10-CM | POA: Insufficient documentation

## 2015-05-14 DIAGNOSIS — Z79899 Other long term (current) drug therapy: Secondary | ICD-10-CM | POA: Diagnosis not present

## 2015-05-14 DIAGNOSIS — E785 Hyperlipidemia, unspecified: Secondary | ICD-10-CM | POA: Insufficient documentation

## 2015-05-14 DIAGNOSIS — Z8546 Personal history of malignant neoplasm of prostate: Secondary | ICD-10-CM | POA: Insufficient documentation

## 2015-05-14 DIAGNOSIS — I129 Hypertensive chronic kidney disease with stage 1 through stage 4 chronic kidney disease, or unspecified chronic kidney disease: Secondary | ICD-10-CM | POA: Insufficient documentation

## 2015-05-14 DIAGNOSIS — K219 Gastro-esophageal reflux disease without esophagitis: Secondary | ICD-10-CM | POA: Insufficient documentation

## 2015-05-14 DIAGNOSIS — I2510 Atherosclerotic heart disease of native coronary artery without angina pectoris: Secondary | ICD-10-CM | POA: Diagnosis not present

## 2015-05-14 DIAGNOSIS — E1122 Type 2 diabetes mellitus with diabetic chronic kidney disease: Secondary | ICD-10-CM | POA: Diagnosis not present

## 2015-05-14 DIAGNOSIS — Z7902 Long term (current) use of antithrombotics/antiplatelets: Secondary | ICD-10-CM | POA: Diagnosis not present

## 2015-05-14 DIAGNOSIS — I255 Ischemic cardiomyopathy: Secondary | ICD-10-CM | POA: Diagnosis not present

## 2015-05-14 DIAGNOSIS — G4733 Obstructive sleep apnea (adult) (pediatric): Secondary | ICD-10-CM | POA: Diagnosis not present

## 2015-05-14 DIAGNOSIS — Z9221 Personal history of antineoplastic chemotherapy: Secondary | ICD-10-CM | POA: Insufficient documentation

## 2015-05-14 DIAGNOSIS — Z923 Personal history of irradiation: Secondary | ICD-10-CM | POA: Diagnosis not present

## 2015-05-14 DIAGNOSIS — Z8673 Personal history of transient ischemic attack (TIA), and cerebral infarction without residual deficits: Secondary | ICD-10-CM | POA: Insufficient documentation

## 2015-05-14 DIAGNOSIS — Z8249 Family history of ischemic heart disease and other diseases of the circulatory system: Secondary | ICD-10-CM | POA: Diagnosis not present

## 2015-05-14 DIAGNOSIS — I5020 Unspecified systolic (congestive) heart failure: Secondary | ICD-10-CM | POA: Diagnosis not present

## 2015-05-14 DIAGNOSIS — I480 Paroxysmal atrial fibrillation: Secondary | ICD-10-CM

## 2015-05-14 DIAGNOSIS — Z85048 Personal history of other malignant neoplasm of rectum, rectosigmoid junction, and anus: Secondary | ICD-10-CM | POA: Diagnosis not present

## 2015-05-14 DIAGNOSIS — Z7982 Long term (current) use of aspirin: Secondary | ICD-10-CM | POA: Diagnosis not present

## 2015-05-14 DIAGNOSIS — I5022 Chronic systolic (congestive) heart failure: Secondary | ICD-10-CM | POA: Diagnosis not present

## 2015-05-14 LAB — COMPREHENSIVE METABOLIC PANEL
ALBUMIN: 3.3 g/dL — AB (ref 3.5–5.0)
ALT: 23 U/L (ref 17–63)
AST: 34 U/L (ref 15–41)
Alkaline Phosphatase: 77 U/L (ref 38–126)
Anion gap: 13 (ref 5–15)
BUN: 31 mg/dL — AB (ref 6–20)
CALCIUM: 9.3 mg/dL (ref 8.9–10.3)
CHLORIDE: 95 mmol/L — AB (ref 101–111)
CO2: 22 mmol/L (ref 22–32)
CREATININE: 2.72 mg/dL — AB (ref 0.61–1.24)
GFR calc Af Amer: 28 mL/min — ABNORMAL LOW (ref >60)
GFR, EST NON AFRICAN AMERICAN: 24 mL/min — AB (ref >60)
Glucose, Bld: 521 mg/dL — ABNORMAL HIGH (ref 65–99)
Potassium: 4.3 mmol/L (ref 3.5–5.1)
SODIUM: 130 mmol/L — AB (ref 135–145)
TOTAL PROTEIN: 7.7 g/dL (ref 6.5–8.1)
Total Bilirubin: 1.1 mg/dL (ref 0.3–1.2)

## 2015-05-14 LAB — DIGOXIN LEVEL: Digoxin Level: 1.2 ng/mL (ref 0.8–2.0)

## 2015-05-14 MED ORDER — METOPROLOL TARTRATE 25 MG PO TABS: 25.0000 mg | ORAL_TABLET | Freq: Every day | ORAL | Status: DC

## 2015-05-14 MED ORDER — METOPROLOL TARTRATE 25 MG PO TABS: 25.0000 mg | ORAL_TABLET | Freq: Two times a day (BID) | ORAL | Status: DC

## 2015-05-14 MED ORDER — PANTOPRAZOLE SODIUM 40 MG PO TBEC: 40.0000 mg | DELAYED_RELEASE_TABLET | Freq: Every day | ORAL | Status: DC

## 2015-05-14 NOTE — Progress Notes (Signed)
Patient ID: [name], male   DOB: 12-05-1956, 59 y.o.   MRN: [id number] PCP: Dr. Gerarda Fraction  59 yo with history of CAD and ischemic cardiomyopathy, BiV ICD upgrade 2009, paroxysmal atrial fibrillation, CKD, and traumatic SAH in 1/16 after a fall.  He has been followed at the heart failure clinic at [facility] by Dr Carolynn Serve in the past.    He was admitted in 3/16 from the [facility] ER with exertional dyspnea/volume overload and had a cardiac arrest/ventricular fibrillation while in the hospital terminated by his ICD.  After diuresis, RHC showed relatively normal filling pressures and preserved cardiac index.  Creatinine peaked at 3.0. (was 5.0 in 1/16)Echo in 3/16 showed EF 25-30% with restrictive diastolic function.  Cardiolite was done, showing areas of scar but no ischemia.  EF 18%.    Admitted 4/16 with hematuria and elevated LFTs. Questionable amiodarone toxicity and this was stopped.   In 5/16 Underwent elective BiV ICD generator change which went without complication. He was admitted again 04/23/15 with acute on chronic CHF and respiratory failure. During thatadmission he ruled in for a NSTEMI- Troponin 4.29. He has chronic stage 4 CRI and it was decided to obtain a Myoview before considering a cath. Myoview done 04/26/15 showed EF 20% .Large region of prior infarct involving the apical anterior, anteroseptal, inferoseptal and inferior walls with extension to the true apex. Small region of reversible/inducible ischemia along the margin of the prior infarct at the mid ventricular anterior and anteroseptal walls. No change from previous. Also during that hospitalization he was seen in consult by the GI service. Dr Carlean Purl felt we could resume his Amiodarone with plans to follow his LFTs. BP very low in hospital  Enalapril switched to bidil. Toprol 25 stopped  He returns for HF follow up. Doing well. Feels good. Mowing the yard with riding mower. Breathing well. Get walk up 2 flights of steps without  dyspnea. Occasional brief episode of CP that lasts a few minutes and resolves. No prolonged episodes. Schedule to see Nephrologist (Bef[place]) next week. At home SBP 100-114.    Labs (3/16): K 3.8, creatinine 2.78 Labs (03/10/2015): K 4.5 Creatinine 2.49  TSH 4.58, LFTs normal, digoxin 0.8 Labs (04/26/15): K 3.5 creatinine 2.13  PMH: 1. HTN 2. Type II diabetes 3. CAD: s/p BMS LAD in 2001, PTCA ramus and BMS LAD in 2009.  Cardiolite (3/16) with EF 18%, no ischemia, prior anterior, apical and inferior infarction.  4. Chronic systolic CHF: Ischemic CMP.  St Jude CRT-D.  Echo (3/16) with EF 25-30%, restrictive diastolic function, mild LVH, mild MR.  RHC (3/16) with mean RA 9, PA 47/29, mean PCWP 16, CI 2.5 (Fick).  Suspect ACEI cough.  5. SAH: 1/16 after fall (traumatic).   6. GERD 7. Atrial fibrillation: Paroxysmal, on amiodarone. 8. Rectal cancer: s/p surgery. Has colostomy.  9. Prostate cancer: s/p chemo/radiation.  10. H/o TIA 11. OSA: On CPAP.  12. Hyperlipidemia 13. CKD  SH: Married, lives in [place], nonsmoker.   FH: CAD  ROS: All systems reviewed and negative except as per HPI.   Current Outpatient Prescriptions  Medication Sig Dispense Refill  . amiodarone (PACERONE) 200 MG tablet Take 1 tablet (200 mg total) by mouth daily. 90 tablet 11  . apixaban (ELIQUIS) 5 MG TABS tablet Take 5 mg by mouth 2 (two) times daily.    Marland Kitchen aspirin EC 81 MG tablet Take 81 mg by mouth daily.    Marland Kitchen co-enzyme Q-10 50 MG capsule Take  50 mg by mouth every morning.     . digoxin (LANOXIN) 0.125 MG tablet Take 1 tablet by mouth every other day.  0  . fluticasone (FLONASE) 50 MCG/ACT nasal spray Place 1 spray into both nostrils daily as needed for allergies.     . furosemide (LASIX) 40 MG tablet Take 1 tablet (40 mg total) by mouth daily. 30 tablet 11  . isosorbide-hydrALAZINE (BIDIL) 20-37.5 MG per tablet Take 0.5 tablets by mouth 3 (three) times daily. 100 tablet 5  . nitroGLYCERIN (NITROLINGUAL) 0.4  MG/SPRAY spray Place 1 spray under the tongue every 5 (five) minutes as needed. angina (Patient taking differently: Place 1 spray under the tongue every 5 (five) minutes as needed for chest pain (used dose last week 04/2015). angina) 12 g 3  . pantoprazole (PROTONIX) 40 MG tablet Take 1 tablet (40 mg total) by mouth daily. 30 tablet 0  . ranolazine (RANEXA) 500 MG 12 hr tablet Take 1 tablet (500 mg total) by mouth 2 (two) times daily. 60 tablet 6  . rosuvastatin (CRESTOR) 40 MG tablet Take 1 tablet (40 mg total) by mouth daily at 6 PM. 90 tablet 11   No current facility-administered medications for this encounter.    BP 106/62 mmHg  Pulse 111  Wt 191 lb (86.637 kg)  SpO2 98% General: NAD Neck: JVP 6, no thyromegaly or thyroid nodule.  Lungs: Clear to auscultation bilaterally with normal respiratory effort. CV: Nondisplaced PMI.  IRR, IRR 2/6 SEM RUSB.  Tr ankle edema.  No carotid bruit.  Normal pedal pulses.  Abdomen: Soft, nontender, no hepatosplenomegaly, no distention. S/p colostomy.  Skin: Intact without lesions or rashes.  Neurologic: Alert and oriented x 3.  Psych: Normal affect. Extremities: No clubbing or cyanosis.  HEENT: Normal.   ECG AF 88   Assessment/Plan: 1. Chronic systolic CHF: Ischemic cardiomyopathy.  Has been followed for a long time at [facility].  Deemed not transplant or VAD candidate with comorbities and advanced CKD. NYHA II-II. BiV St Jude.  - Overall improved. NYHA II-III. Volume status stable.  - Given CKD and elevated dig level today will stop digoxin -Continue current diuretic dose - Will attempt to add back Toprol 25 bid - Given CKD and previous cancer not good VAD or Tx candidate (would need heart/kidney)  2. Atrial fibrillation: Paroxysmal.  In AF today.  I interrogated device personally and shows 45% AF burden and appears to be PAF. So no role for DC-CV. He is on amiodarone/Ranexa and Eliquis. Given question of amio toxicity, I am hesitant to increase amio.  Had traumatic SAH in 1/16 but stable back on Eliquis.  - Continue amio to 200 mg daily.  Add Toprol 25 bid.  3. CAD: Recent NSTEMI. Stress test without high ischemic burden. No cath due to renal failure. Continue medical therapy. He is on ASA 81 daily and statin. Angina controlled with Ranexa and isordil.  4. CKD: Stable creatinine most recently.   Sees Dr Lowanda Foster in [place].  5. Ventricular fibrillation: s/p recent generator change. Back on Amio. LFTs stable per GI.   Total time spent 45 minutes. Over half that time spent discussing above.    Glori Bickers MD  05/14/2015

## 2015-05-14 NOTE — Telephone Encounter (Signed)
No showed last apt,needs apt for refill

## 2015-05-14 NOTE — Patient Instructions (Addendum)
START Metoprolol (Lopressor) 25 mg (1 tablet) twice daily.  Follow up 2 months.  Do the following things EVERYDAY: 1) Weigh yourself in the morning before breakfast. Write it down and keep it in a log. 2) Take your medicines as prescribed 3) Eat low salt foods-Limit salt (sodium) to 2000 mg per day.  4) Stay as active as you can everyday 5) Limit all fluids for the day to less than 2 liters

## 2015-05-15 ENCOUNTER — Telehealth (HOSPITAL_COMMUNITY): Payer: Self-pay

## 2015-05-15 DIAGNOSIS — E119 Type 2 diabetes mellitus without complications: Secondary | ICD-10-CM | POA: Diagnosis not present

## 2015-05-15 DIAGNOSIS — I2510 Atherosclerotic heart disease of native coronary artery without angina pectoris: Secondary | ICD-10-CM | POA: Diagnosis not present

## 2015-05-15 DIAGNOSIS — R634 Abnormal weight loss: Secondary | ICD-10-CM | POA: Diagnosis not present

## 2015-05-15 DIAGNOSIS — Z8546 Personal history of malignant neoplasm of prostate: Secondary | ICD-10-CM | POA: Diagnosis not present

## 2015-05-15 DIAGNOSIS — I255 Ischemic cardiomyopathy: Secondary | ICD-10-CM | POA: Diagnosis not present

## 2015-05-15 DIAGNOSIS — I5020 Unspecified systolic (congestive) heart failure: Secondary | ICD-10-CM | POA: Diagnosis not present

## 2015-05-15 DIAGNOSIS — I482 Chronic atrial fibrillation: Secondary | ICD-10-CM | POA: Diagnosis not present

## 2015-05-15 DIAGNOSIS — K8020 Calculus of gallbladder without cholecystitis without obstruction: Secondary | ICD-10-CM | POA: Diagnosis not present

## 2015-05-15 DIAGNOSIS — Z95810 Presence of automatic (implantable) cardiac defibrillator: Secondary | ICD-10-CM | POA: Diagnosis not present

## 2015-05-15 NOTE — Telephone Encounter (Signed)
Lab results forwarded to Dr. Lowanda Foster (Nephrologist) at [facility].  Renee Pain

## 2015-05-16 ENCOUNTER — Telehealth (HOSPITAL_COMMUNITY): Payer: Self-pay | Admitting: *Deleted

## 2015-05-16 ENCOUNTER — Telehealth: Payer: Self-pay | Admitting: *Deleted

## 2015-05-16 NOTE — Telephone Encounter (Signed)
Notes Recorded by Scarlette Calico, RN on 05/16/2015 at 4:44 PM Pt's wife aware and agreeable, order for labs in 1 week faxed to [facility] at [phone]

## 2015-05-16 NOTE — Telephone Encounter (Signed)
-----   Message from Jolaine Artist, MD sent at 05/14/2015  5:59 PM EDT ----- Dig level high. Please stop digoxin. Creatinine up slightly. repeat 1 week.

## 2015-05-16 NOTE — Telephone Encounter (Signed)
-----   Message from Ladell Pier, MD sent at 04/22/2015  8:39 AM EDT ----- Please call patient, cea is normal Reviewed CT Pelvis in radiology.  Peri-rectal "nodule" is likely a fluid collection, possibly related to CHF, low suspicion for cancer,  Plan for f/u CT in 4 months.

## 2015-05-16 NOTE — Telephone Encounter (Signed)
Left message on voicemail informing pt Dr. Benay Spice reviewed CT in radiology last month. Likely a fluid collection, may be related to CHF. Requested he call office to discuss.

## 2015-05-19 ENCOUNTER — Telehealth: Payer: Self-pay | Admitting: *Deleted

## 2015-05-19 ENCOUNTER — Telehealth (HOSPITAL_COMMUNITY): Payer: Self-pay

## 2015-05-19 NOTE — Telephone Encounter (Signed)
Carrie with [employer] called reporting "patient does not want the CT pelvis at [facility].  He lives in [place] and wants this done at [facility] and [employer] do not schedule for [employer]."  Will notify Dr. Benay Spice.

## 2015-05-19 NOTE — Telephone Encounter (Signed)
Called and talked with patient, patient lives in [place]. Patient would like to go to [facility]. I called office and notified them 4:08pm June 13

## 2015-05-20 DIAGNOSIS — E559 Vitamin D deficiency, unspecified: Secondary | ICD-10-CM | POA: Diagnosis not present

## 2015-05-20 DIAGNOSIS — I10 Essential (primary) hypertension: Secondary | ICD-10-CM | POA: Diagnosis not present

## 2015-05-20 DIAGNOSIS — Z79899 Other long term (current) drug therapy: Secondary | ICD-10-CM | POA: Diagnosis not present

## 2015-05-20 DIAGNOSIS — D649 Anemia, unspecified: Secondary | ICD-10-CM | POA: Diagnosis not present

## 2015-05-20 DIAGNOSIS — N183 Chronic kidney disease, stage 3 (moderate): Secondary | ICD-10-CM | POA: Diagnosis not present

## 2015-05-20 DIAGNOSIS — R809 Proteinuria, unspecified: Secondary | ICD-10-CM | POA: Diagnosis not present

## 2015-05-21 DIAGNOSIS — N184 Chronic kidney disease, stage 4 (severe): Secondary | ICD-10-CM | POA: Diagnosis not present

## 2015-05-21 DIAGNOSIS — E559 Vitamin D deficiency, unspecified: Secondary | ICD-10-CM | POA: Diagnosis not present

## 2015-05-21 DIAGNOSIS — E785 Hyperlipidemia, unspecified: Secondary | ICD-10-CM | POA: Diagnosis not present

## 2015-05-21 DIAGNOSIS — I10 Essential (primary) hypertension: Secondary | ICD-10-CM | POA: Diagnosis not present

## 2015-05-21 DIAGNOSIS — I509 Heart failure, unspecified: Secondary | ICD-10-CM | POA: Diagnosis not present

## 2015-05-21 DIAGNOSIS — E1122 Type 2 diabetes mellitus with diabetic chronic kidney disease: Secondary | ICD-10-CM | POA: Diagnosis not present

## 2015-05-21 DIAGNOSIS — E871 Hypo-osmolality and hyponatremia: Secondary | ICD-10-CM | POA: Diagnosis not present

## 2015-05-22 DIAGNOSIS — E1122 Type 2 diabetes mellitus with diabetic chronic kidney disease: Secondary | ICD-10-CM | POA: Diagnosis not present

## 2015-05-22 DIAGNOSIS — I5023 Acute on chronic systolic (congestive) heart failure: Secondary | ICD-10-CM | POA: Diagnosis not present

## 2015-05-22 DIAGNOSIS — I2510 Atherosclerotic heart disease of native coronary artery without angina pectoris: Secondary | ICD-10-CM | POA: Diagnosis not present

## 2015-05-22 DIAGNOSIS — I480 Paroxysmal atrial fibrillation: Secondary | ICD-10-CM | POA: Diagnosis not present

## 2015-05-22 DIAGNOSIS — I214 Non-ST elevation (NSTEMI) myocardial infarction: Secondary | ICD-10-CM | POA: Diagnosis not present

## 2015-05-22 DIAGNOSIS — N184 Chronic kidney disease, stage 4 (severe): Secondary | ICD-10-CM | POA: Diagnosis not present

## 2015-05-29 DIAGNOSIS — I214 Non-ST elevation (NSTEMI) myocardial infarction: Secondary | ICD-10-CM | POA: Diagnosis not present

## 2015-05-29 DIAGNOSIS — I2510 Atherosclerotic heart disease of native coronary artery without angina pectoris: Secondary | ICD-10-CM | POA: Diagnosis not present

## 2015-05-29 DIAGNOSIS — I480 Paroxysmal atrial fibrillation: Secondary | ICD-10-CM | POA: Diagnosis not present

## 2015-05-29 DIAGNOSIS — I5023 Acute on chronic systolic (congestive) heart failure: Secondary | ICD-10-CM | POA: Diagnosis not present

## 2015-05-29 DIAGNOSIS — E1122 Type 2 diabetes mellitus with diabetic chronic kidney disease: Secondary | ICD-10-CM | POA: Diagnosis not present

## 2015-05-29 DIAGNOSIS — N184 Chronic kidney disease, stage 4 (severe): Secondary | ICD-10-CM | POA: Diagnosis not present

## 2015-06-02 ENCOUNTER — Telehealth: Payer: Self-pay | Admitting: Oncology

## 2015-06-02 ENCOUNTER — Other Ambulatory Visit: Payer: Self-pay

## 2015-06-02 NOTE — Telephone Encounter (Signed)
Lft another msg for pt confirming CT scan scheduled at [facility], advised to p/u contrast through them, mailed out schedule... KJ

## 2015-06-06 DIAGNOSIS — I480 Paroxysmal atrial fibrillation: Secondary | ICD-10-CM | POA: Diagnosis not present

## 2015-06-06 DIAGNOSIS — E1122 Type 2 diabetes mellitus with diabetic chronic kidney disease: Secondary | ICD-10-CM | POA: Diagnosis not present

## 2015-06-06 DIAGNOSIS — I214 Non-ST elevation (NSTEMI) myocardial infarction: Secondary | ICD-10-CM | POA: Diagnosis not present

## 2015-06-06 DIAGNOSIS — K8020 Calculus of gallbladder without cholecystitis without obstruction: Secondary | ICD-10-CM

## 2015-06-06 DIAGNOSIS — I5023 Acute on chronic systolic (congestive) heart failure: Secondary | ICD-10-CM | POA: Diagnosis not present

## 2015-06-06 DIAGNOSIS — N184 Chronic kidney disease, stage 4 (severe): Secondary | ICD-10-CM | POA: Diagnosis not present

## 2015-06-06 DIAGNOSIS — I2510 Atherosclerotic heart disease of native coronary artery without angina pectoris: Secondary | ICD-10-CM | POA: Diagnosis not present

## 2015-06-06 HISTORY — DX: Calculus of gallbladder without cholecystitis without obstruction: K80.20

## 2015-06-07 IMAGING — CT CT HEAD W/O CM
1 series · 15 of 30 positions shown, 19 images · non-contrast
Comparison: None.

CLINICAL DATA: Acute onset dizziness, weakness, and unsteady gait
earlier this evening, associated with double vision and
nausea/vomiting.

EXAM:
CT HEAD WITHOUT CONTRAST
TECHNIQUE: Contiguous axial images were obtained from the base of the skull
through the vertex without intravenous contrast.

[Series 2: headseq 4.8 h37s · axial · 0.47mm/px · z∈[+70,+231]mm · 15 of 36 slices shown, 19 images]
[im 2/36  brain]
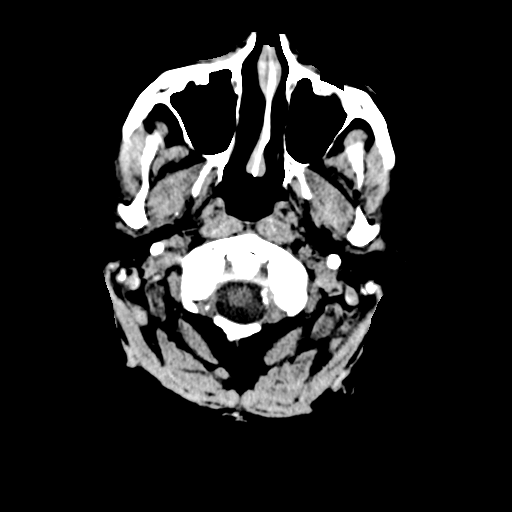
[im 2/36  bone]
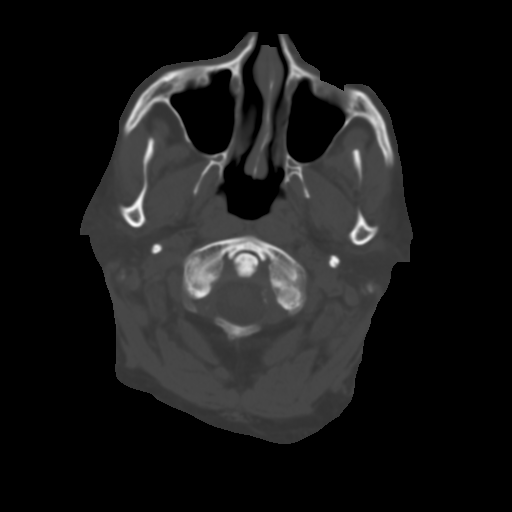
[im 4/36  brain]
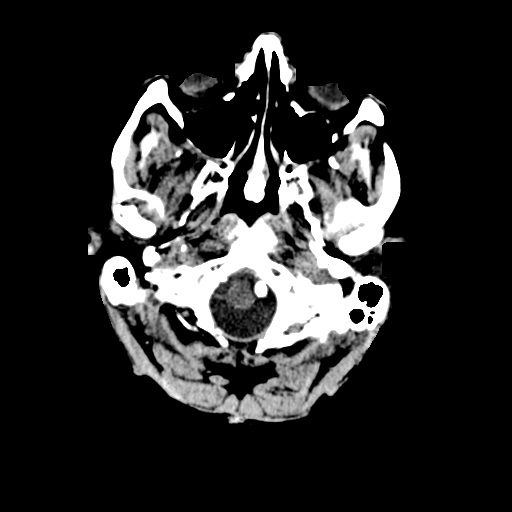
[im 7/36  brain]
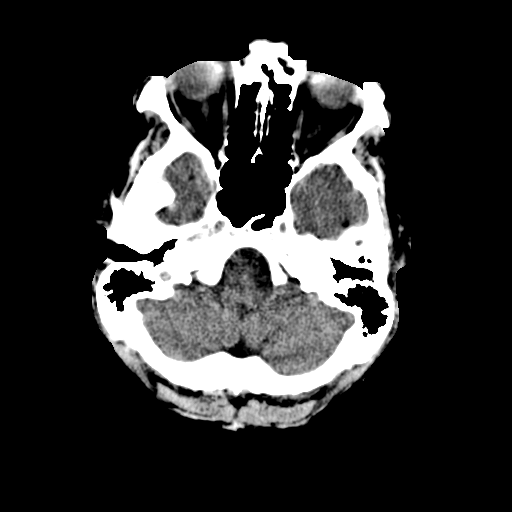
[im 9/36  brain]
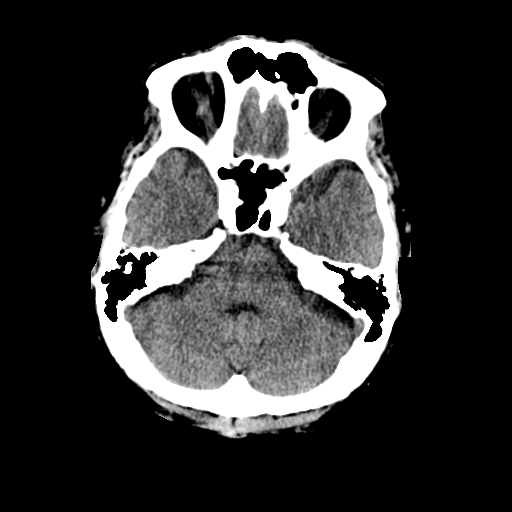
[im 11/36  brain]
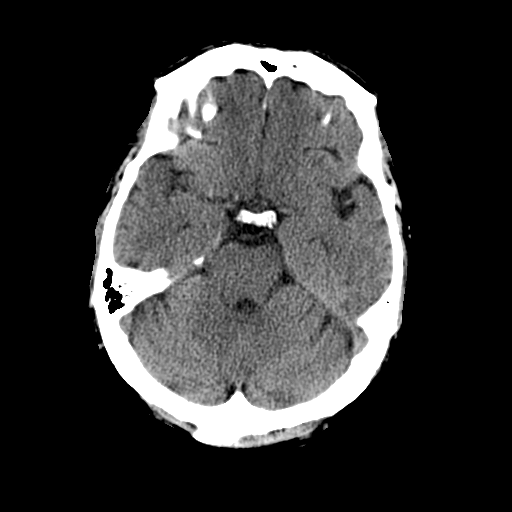
[im 11/36  bone]
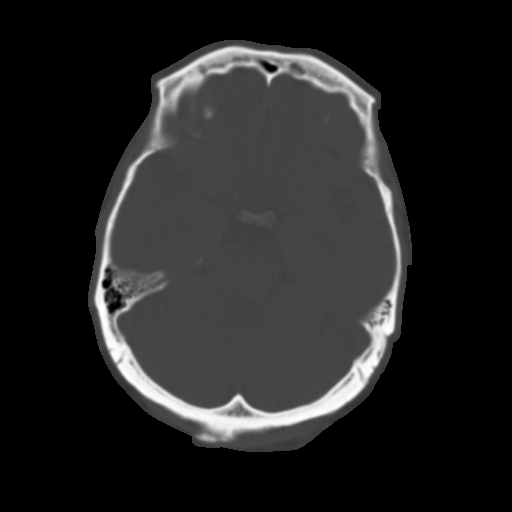
[im 14/36  brain]
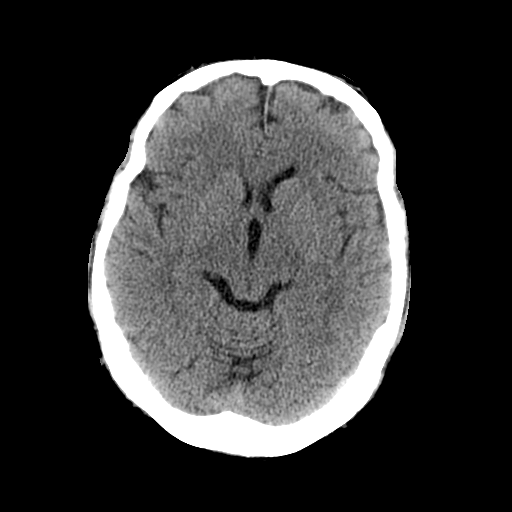
[im 16/36  brain]
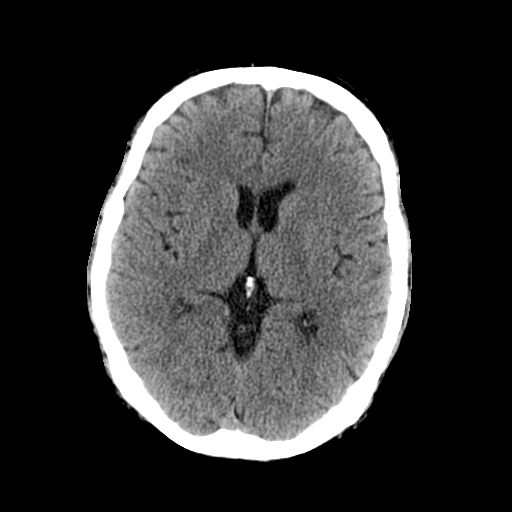
[im 19/36  brain]
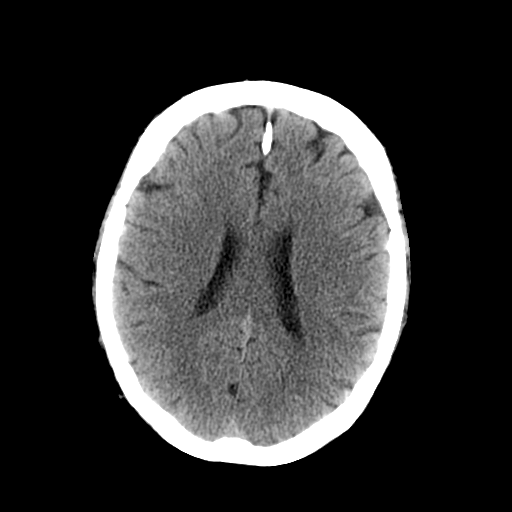
[im 20/36  brain]
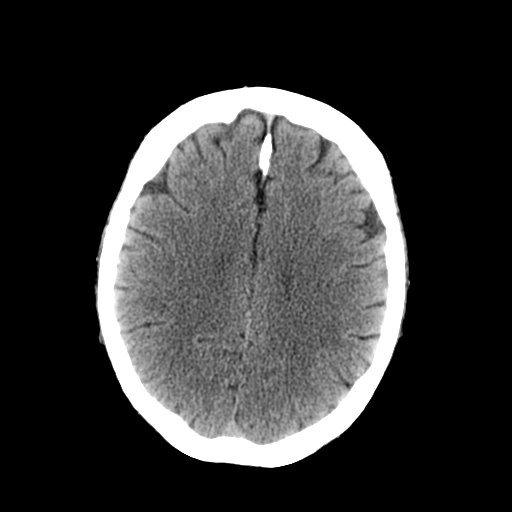
[im 20/36  bone]
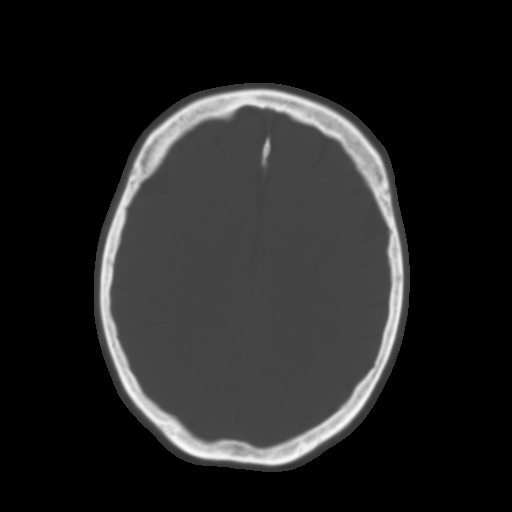
[im 22/36  brain]
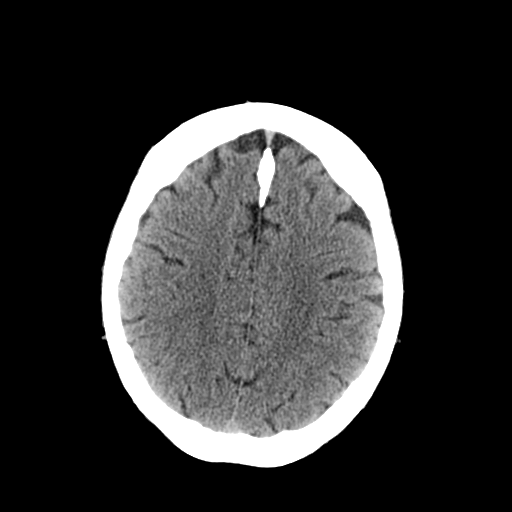
[im 25/36  brain]
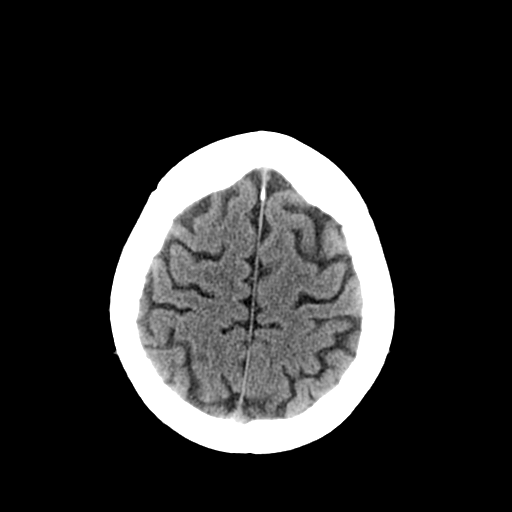
[im 27/36  brain]
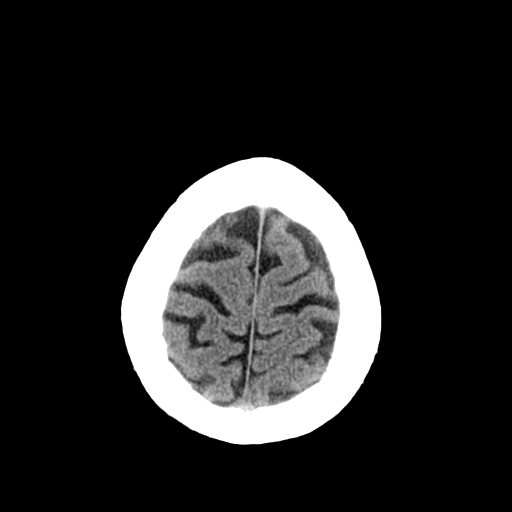
[im 29/36  brain]
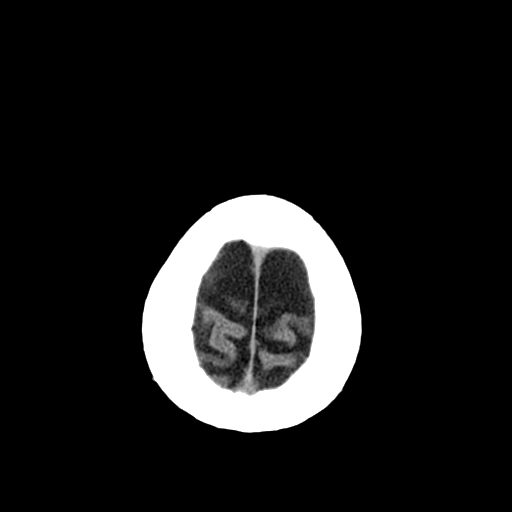
[im 29/36  bone]
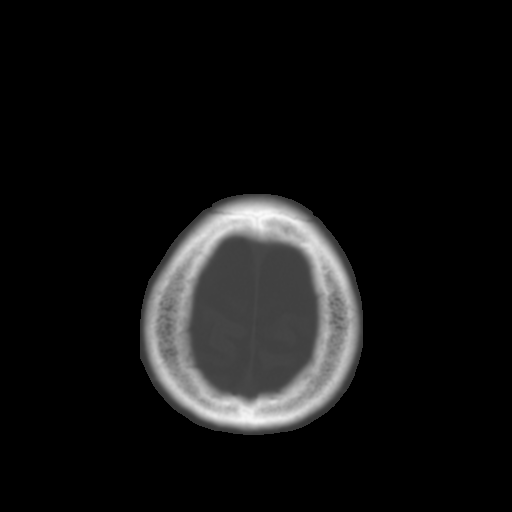
[im 32/36  brain]
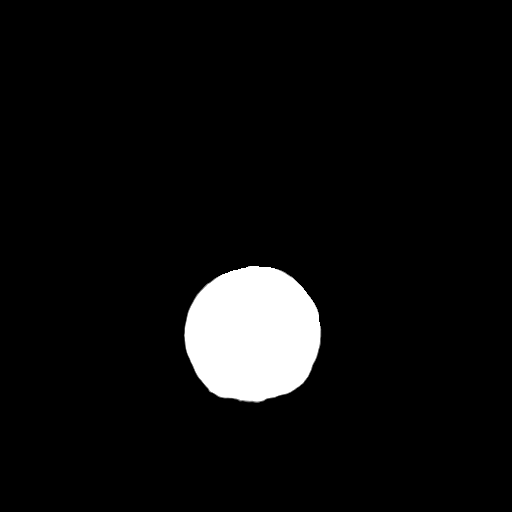
[im 34/36  brain]
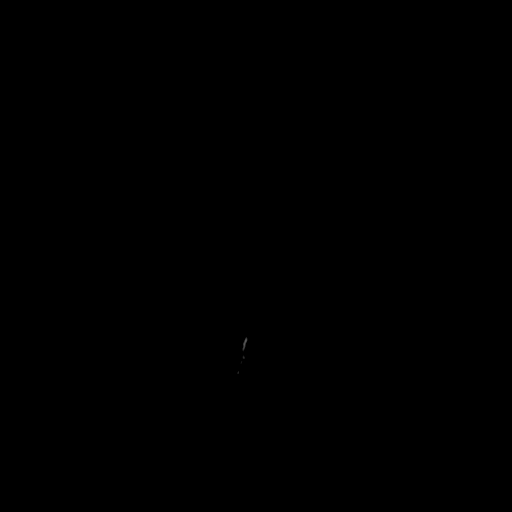

[15 of 30 positions shown; findings below may reference images not displayed]

FINDINGS: Ventricular system normal in size and appearance for age. No
significant atrophy for age. No mass lesion. No midline shift. No
acute hemorrhage or hematoma. No extra-axial fluid collections. No
evidence of acute infarction. No focal brain parenchymal
abnormality.

No focal osseous abnormality involving the skull. Visualized
paranasal sinuses, bilateral mastoid air cells, and bilateral middle
ear cavities well-aerated. Severe bilateral carotid siphon and
vertebral artery atherosclerosis.
IMPRESSION: 1. No acute intracranial abnormality.
2. Severe bilateral carotid siphon and vertebral artery
atherosclerosis. Given the vertebral artery atherosclerosis,
vertebrobasilar insufficiency might be considered as a cause for the
symptoms. Non-emergent MRI of the brain and MRA of the intracranial
vessels and cervical carotid and vertebral arteries may be helpful
if this is a clinical consideration.

## 2015-06-07 IMAGING — CR DG CHEST 1V
1 series · 1 of 1 positions shown · non-contrast
Comparison: DG CHEST 1V PORT dated 12/11/2013

CLINICAL DATA: Dizziness, weakness and unsteady gait. Systolic
heart failure.

EXAM:
CHEST - 1 VIEW

[view not recorded]
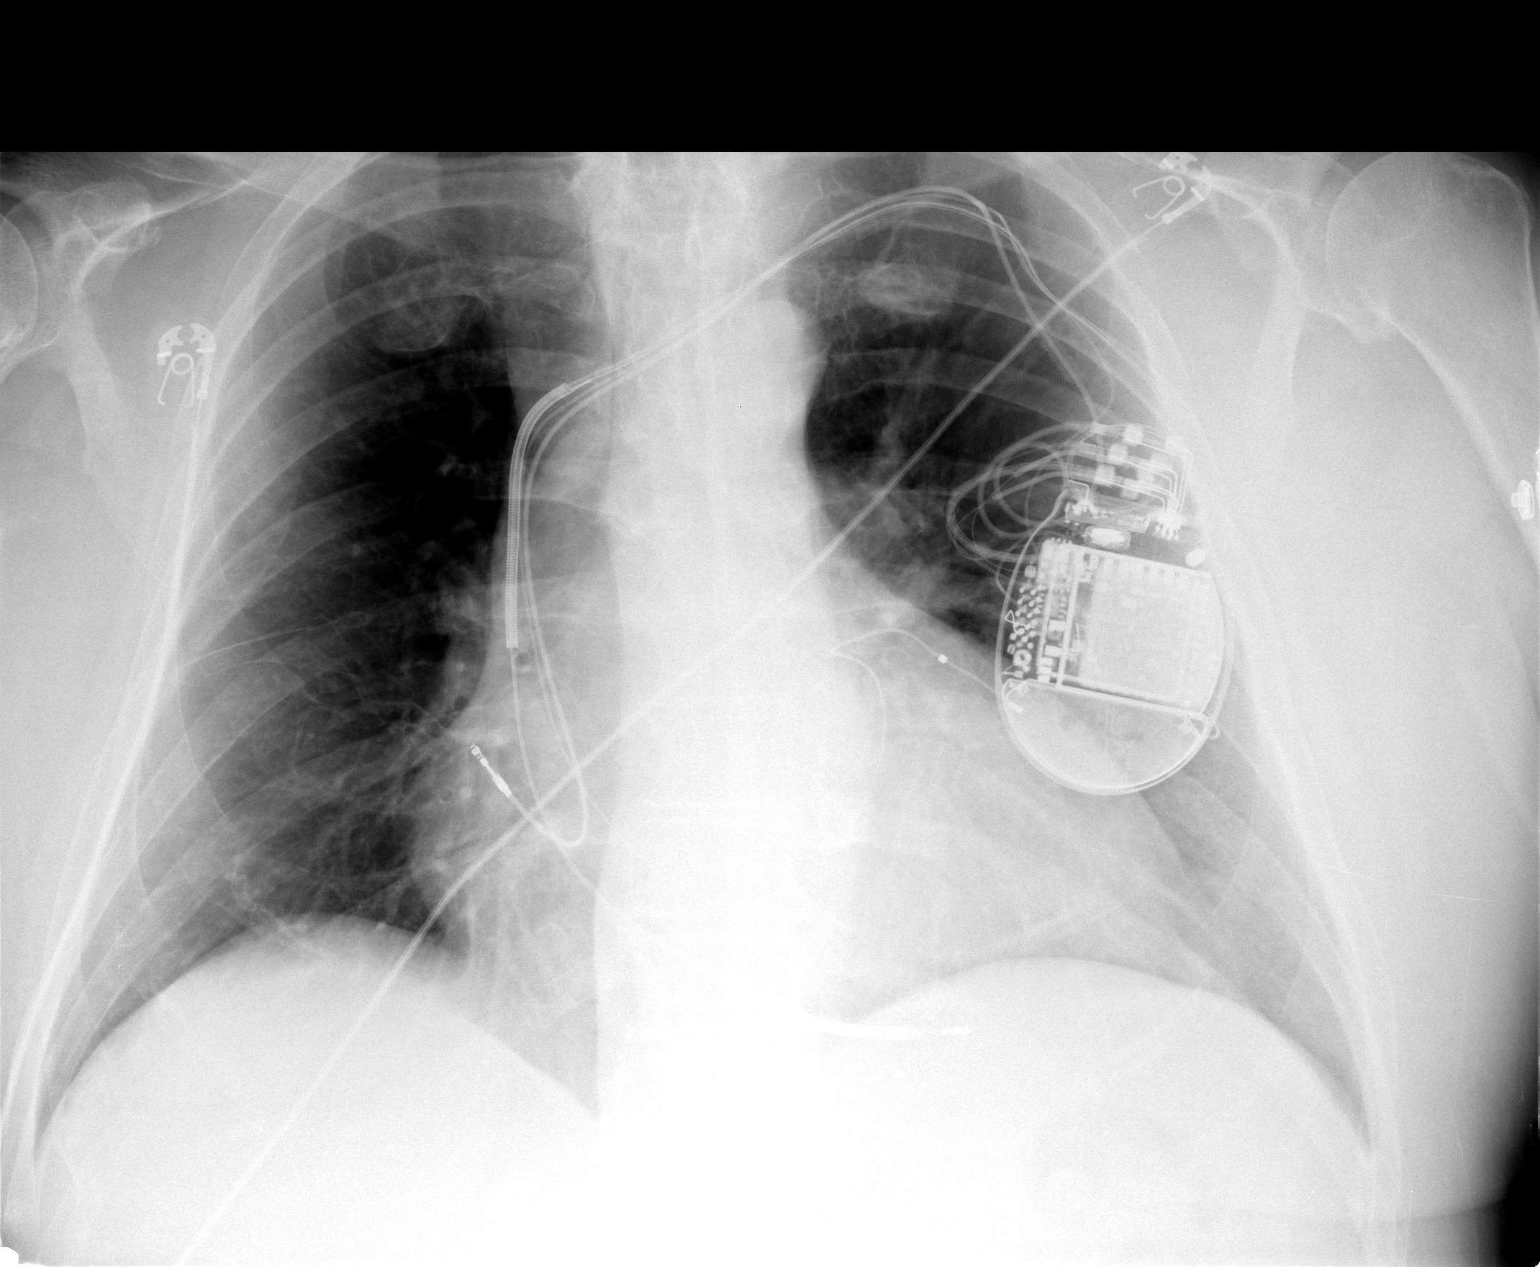

[1 of 1 positions shown; findings below may reference images not displayed]

FINDINGS: Cardiac silhouette remains moderately enlarged, mediastinal
silhouette is nonsuspicious. Biventricular left cardiac
defibrillator in situ. Lungs are clear, no pleural effusions or
focal consolidations, improved aeration from prior examination. No
pneumothorax.

Multiple EKG lines overlie the patient and may obscure subtle
underlying pathology. Mild degenerative change of the thoracic
spine.
IMPRESSION: Stable cardiomegaly, no acute pulmonary process, improved aeration
of the lungs from December 11, 2013.

  By: Waikai Arzadon

## 2015-06-09 IMAGING — US US CAROTID DUPLEX BILAT
1 series · 13 of 24 positions shown · non-contrast
Comparison: None.

CLINICAL DATA: Dizziness, coronary artery disease, diabetes.

EXAM:
BILATERAL CAROTID DUPLEX ULTRASOUND
TECHNIQUE: Gray scale imaging, color Doppler and duplex ultrasound was
performed of bilateral carotid and vertebral arteries in the neck.

[Series 1: us carotid duplex bilat · 0.06mm/px · 13 of 69 slices shown]
[im 1/69]
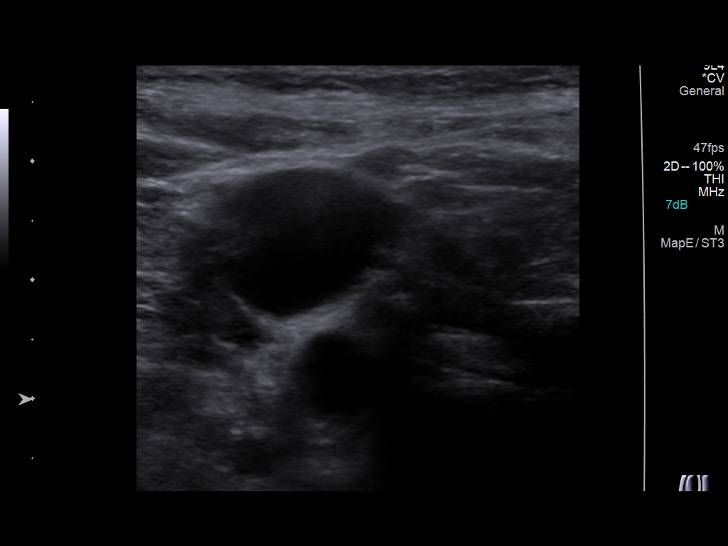
[im 6/69]
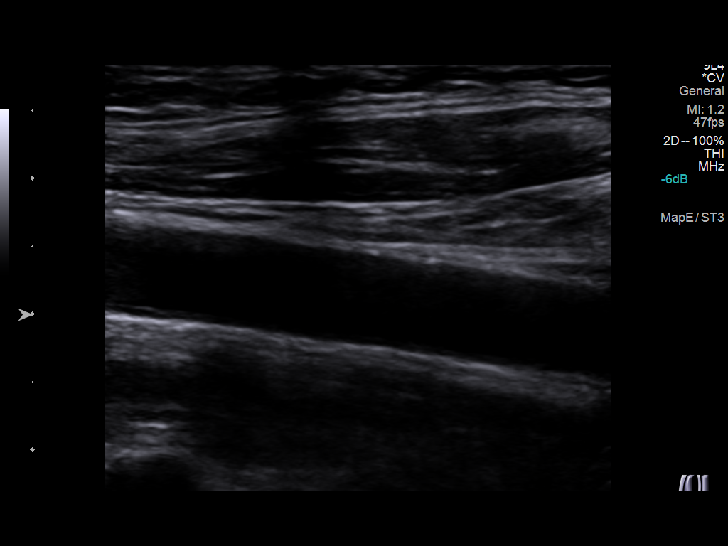
[im 12/69]
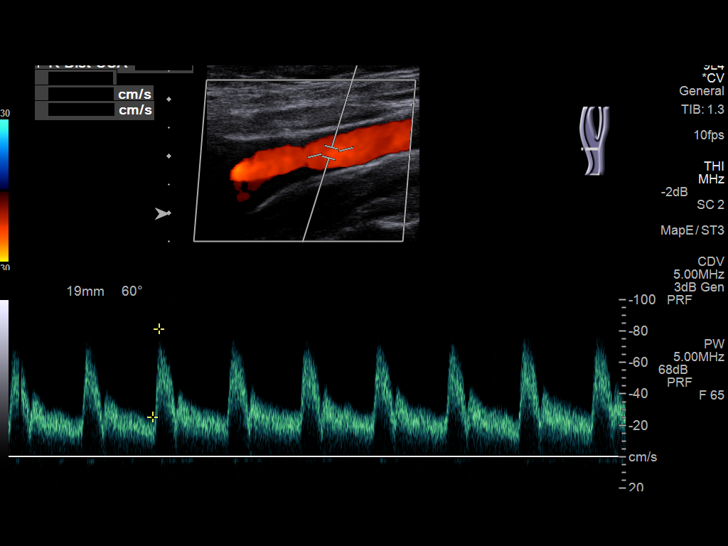
[im 18/69]
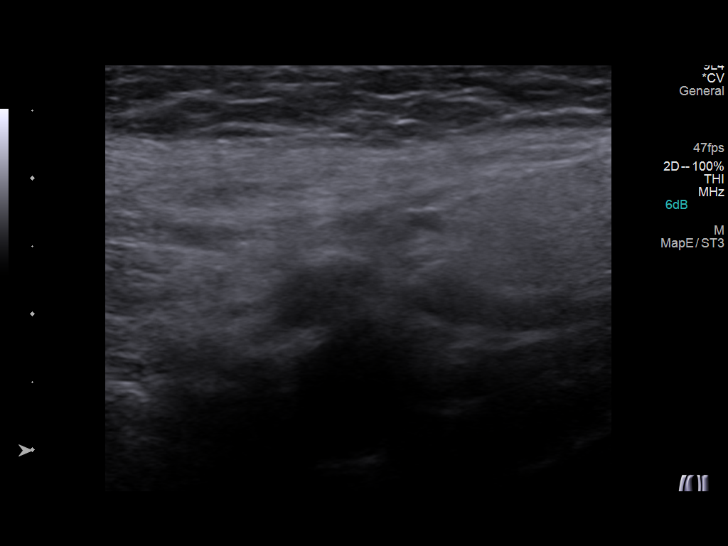
[im 24/69]
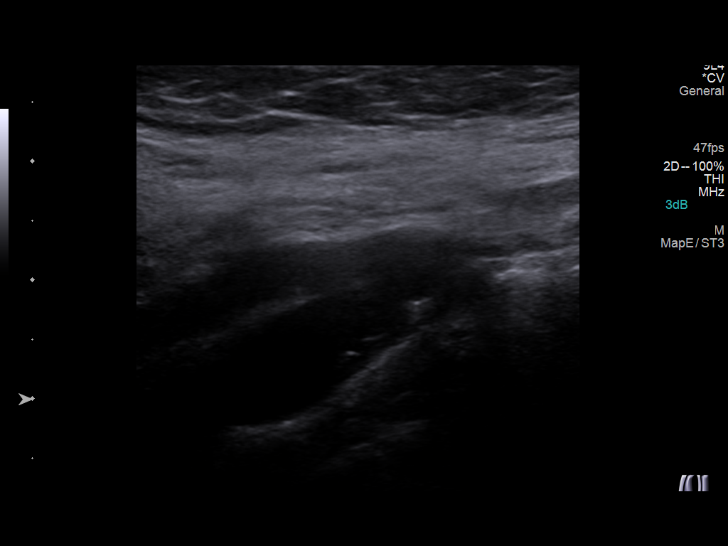
[im 30/69]
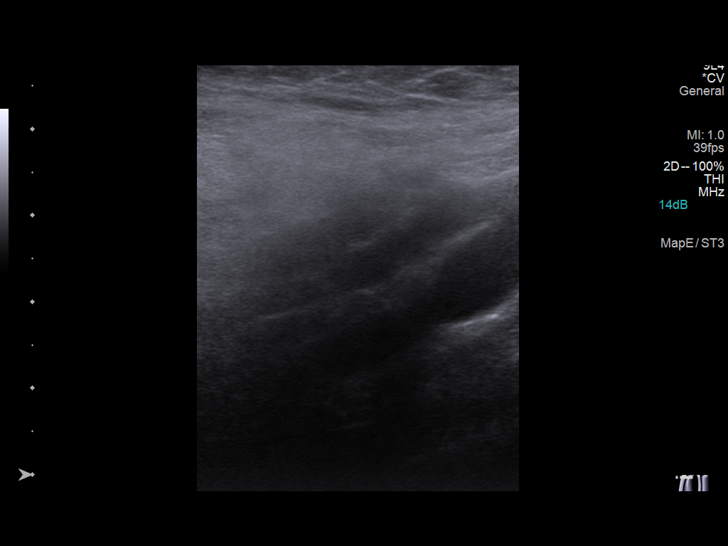
[im 36/69]
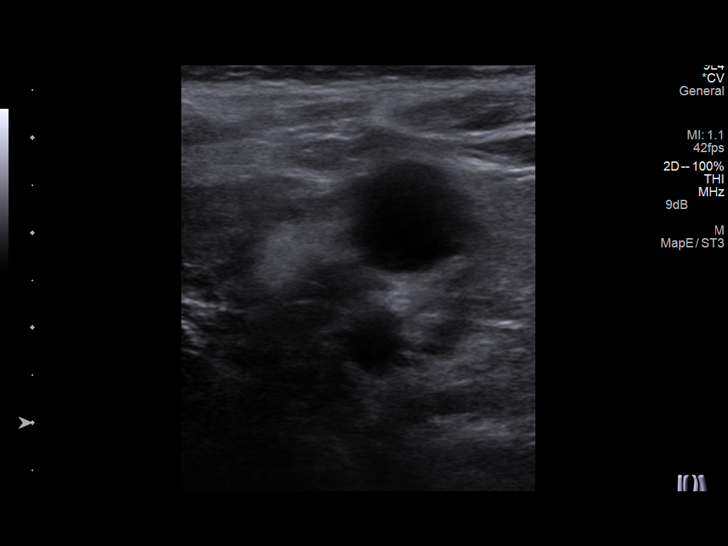
[im 39/69]
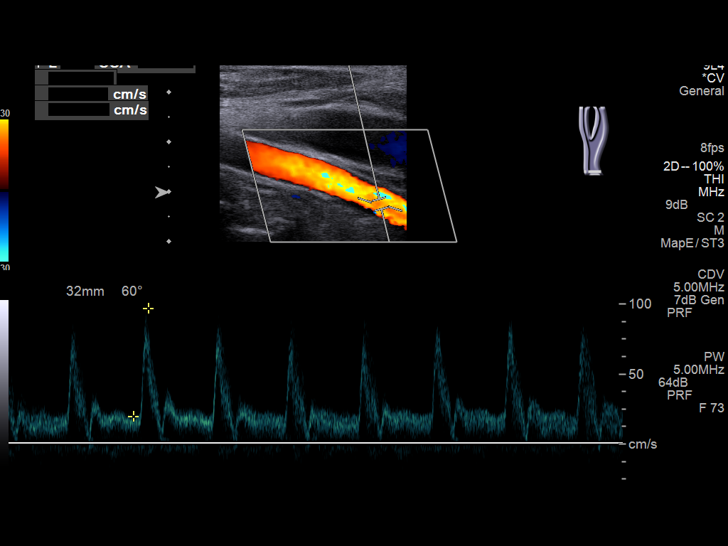
[im 45/69]
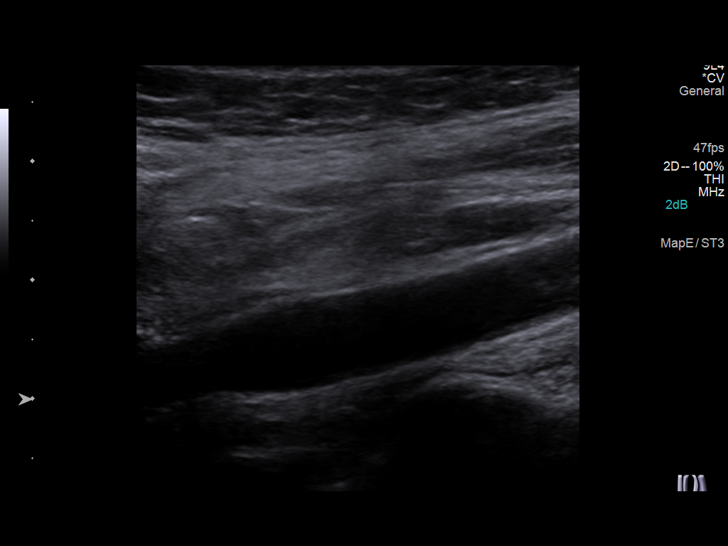
[im 51/69]
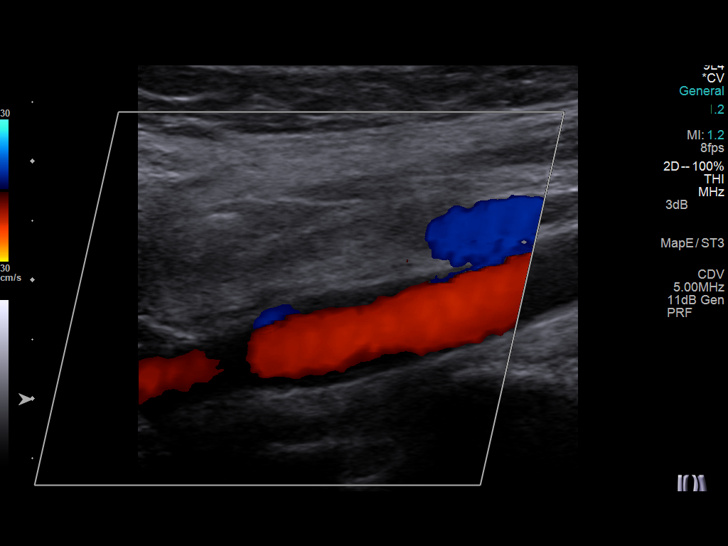
[im 57/69]
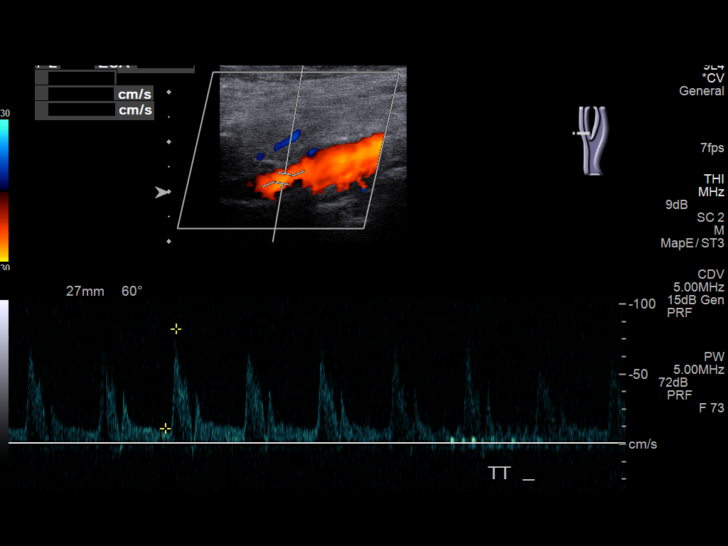
[im 63/69]
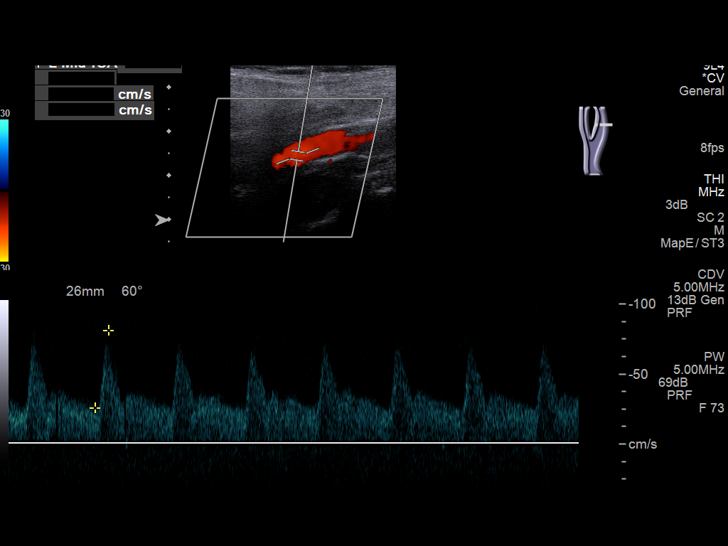
[im 69/69]
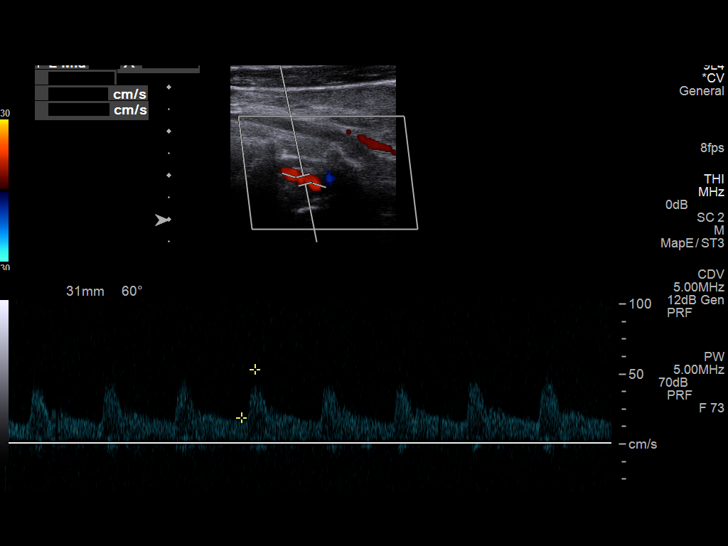

[13 of 24 positions shown; findings below may reference images not displayed]

REVIEW OF SYSTEMS:
Quantification of carotid stenosis is based on velocity parameters
that correlate the residual internal carotid diameter with
NASCET-based stenosis levels, using the diameter of the distal
internal carotid lumen as the denominator for stenosis measurement.

The following velocity measurements were obtained:

PEAK SYSTOLIC/END DIASTOLIC

RIGHT

ICA:                     105/28cm/sec

CCA:                     75/20cm/sec

SYSTOLIC ICA/CCA RATIO:

DIASTOLIC ICA/CCA RATIO:

ECA:                     61cm/sec

LEFT

ICA:                     81/26cm/sec

CCA:                     80/21cm/sec

SYSTOLIC ICA/CCA RATIO:

DIASTOLIC ICA/CCA RATIO:

ECA:                     82cm/sec
FINDINGS: RIGHT CAROTID ARTERY: Smooth on calcified plaque effaces the carotid
bulb and extends into the proximal ICA resulting in at least mild
stenosis. Normal waveforms and color Doppler signal.

RIGHT VERTEBRAL ARTERY:  Normal flow direction and waveform.

LEFT CAROTID ARTERY: Circumferential partially calcified plaque in
the proximal ICA resulting in at least mild stenosis. Normal
waveforms and color Doppler signal.

LEFT VERTEBRAL ARTERY: Normal flow direction and waveform.
IMPRESSION: 1. Bilateral proximal ICA plaque, resulting in less than 50%
diameter stenosis. The exam does not exclude plaque ulceration or
embolization. Continued surveillance recommended.

## 2015-06-09 IMAGING — CT CT HEAD W/O CM
1 series · 16 of 30 positions shown, 20 images · non-contrast
Comparison: 01/26/2014

CLINICAL DATA: Followup for ataxia.  History of colon carcinoma.

EXAM:
CT HEAD WITHOUT CONTRAST
TECHNIQUE: Contiguous axial images were obtained from the base of the skull
through the vertex without intravenous contrast.

[Series 2: headseq 4.8 h37s · axial · 0.43mm/px · z∈[+116,+271]mm · 16 of 36 slices shown, 20 images]
[im 2/36  brain]
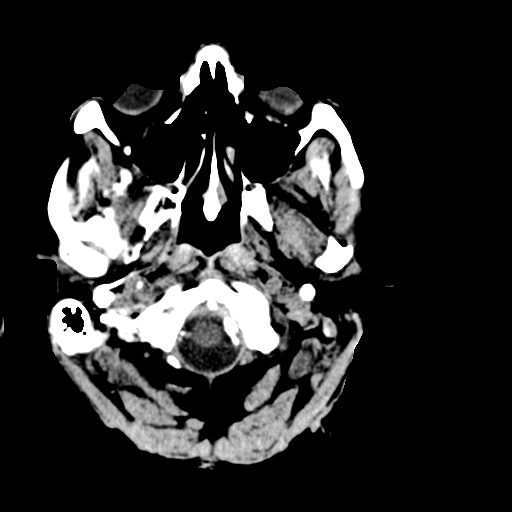
[im 2/36  bone]
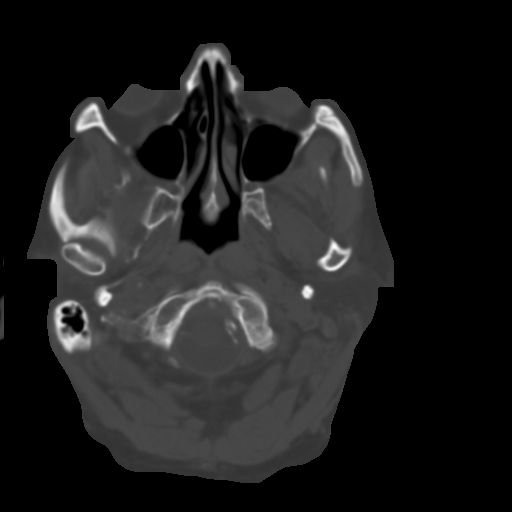
[im 4/36  brain]
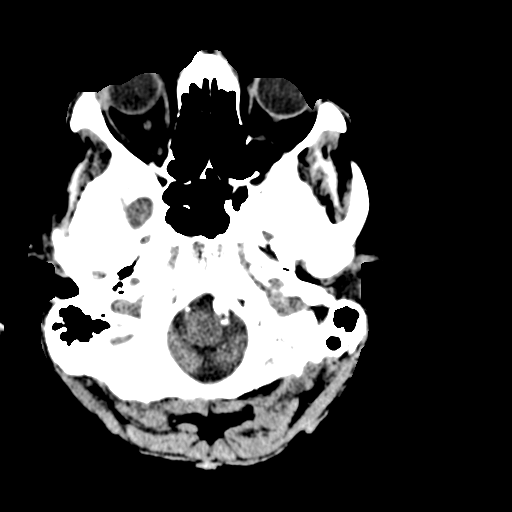
[im 7/36  brain]
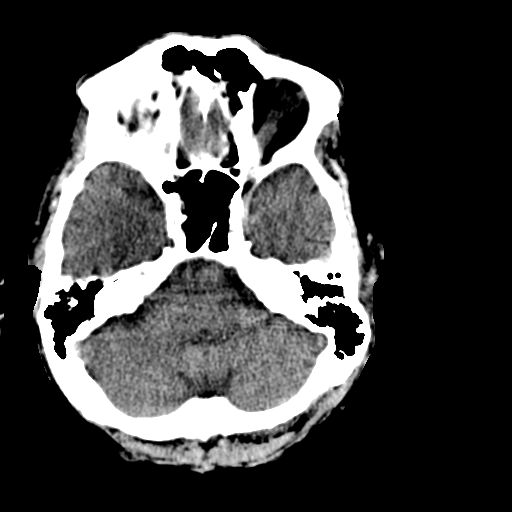
[im 9/36  brain]
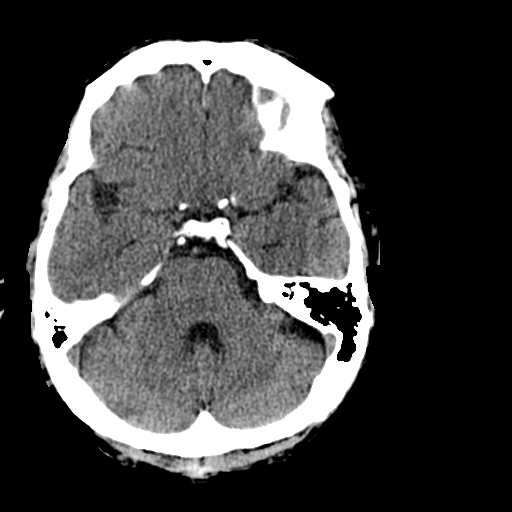
[im 10/36  brain]
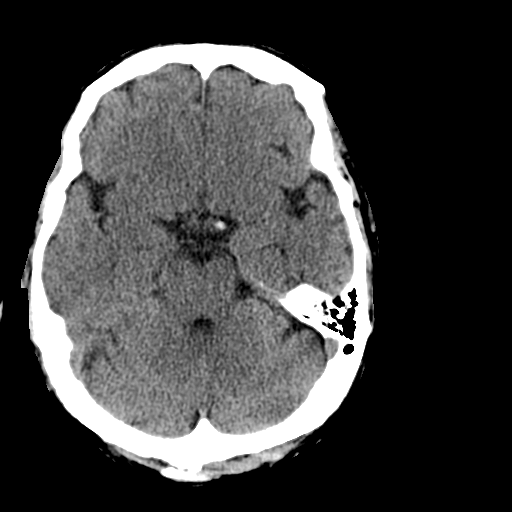
[im 10/36  bone]
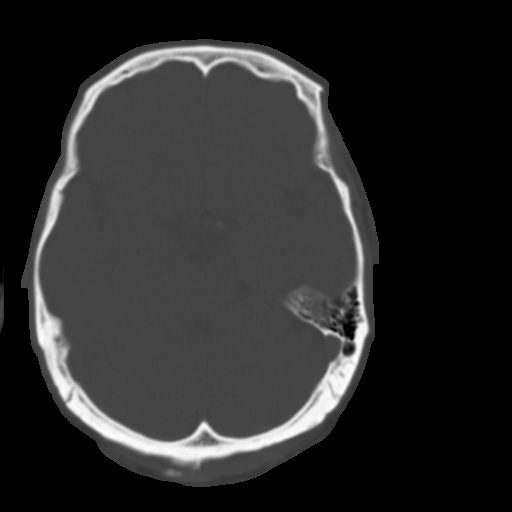
[im 13/36  brain]
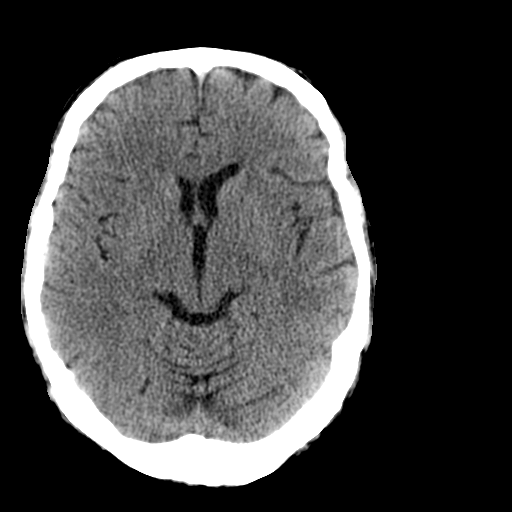
[im 15/36  brain]
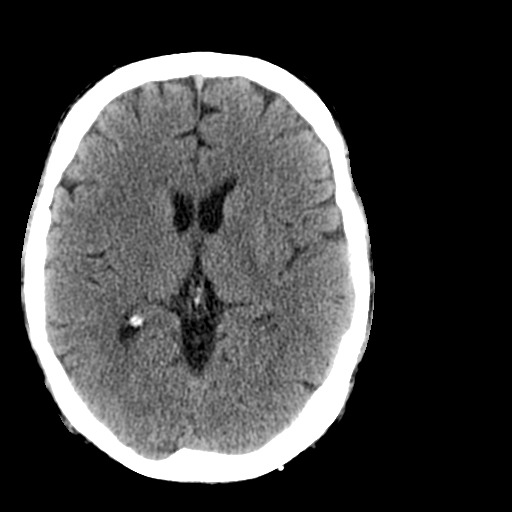
[im 17/36  brain]
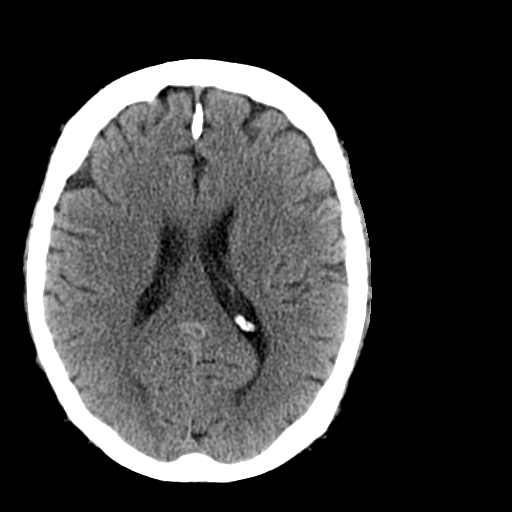
[im 19/36  brain]
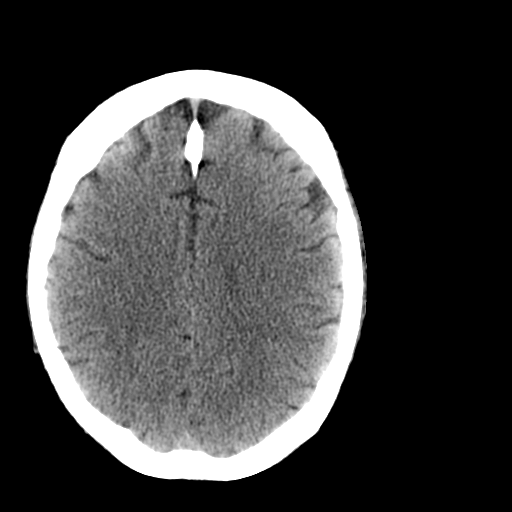
[im 19/36  bone]
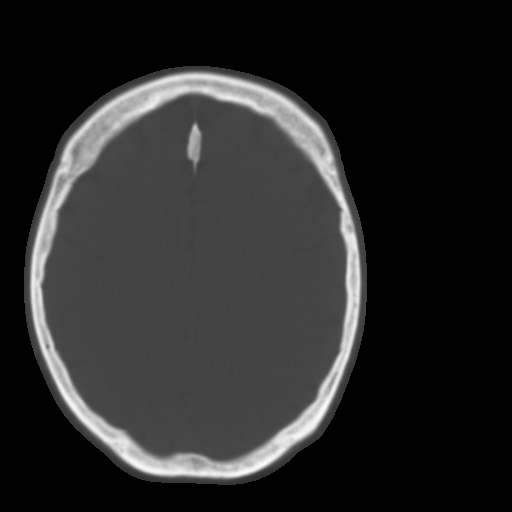
[im 21/36  brain]
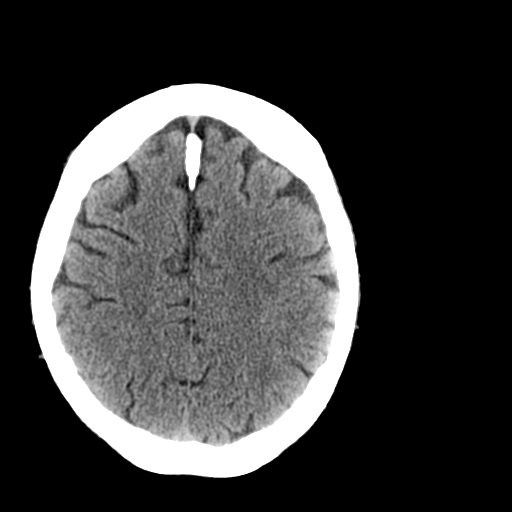
[im 23/36  brain]
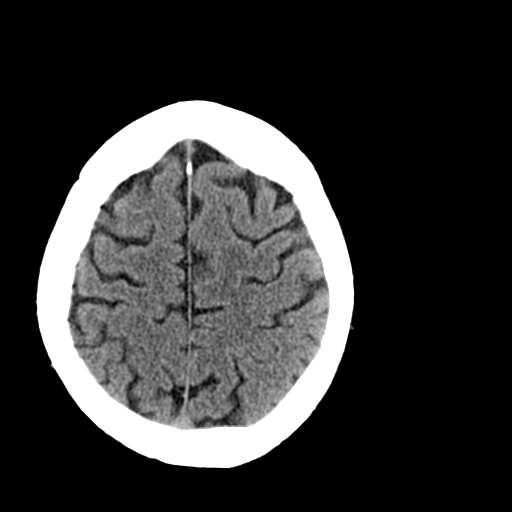
[im 26/36  brain]
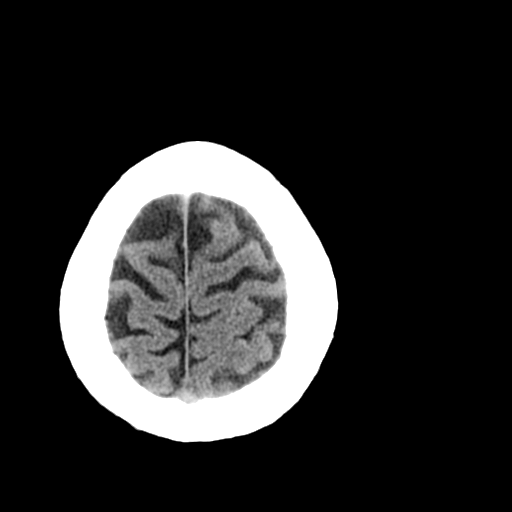
[im 27/36  brain]
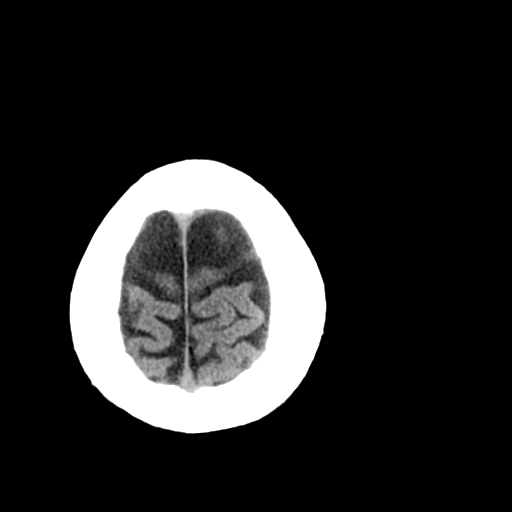
[im 27/36  bone]
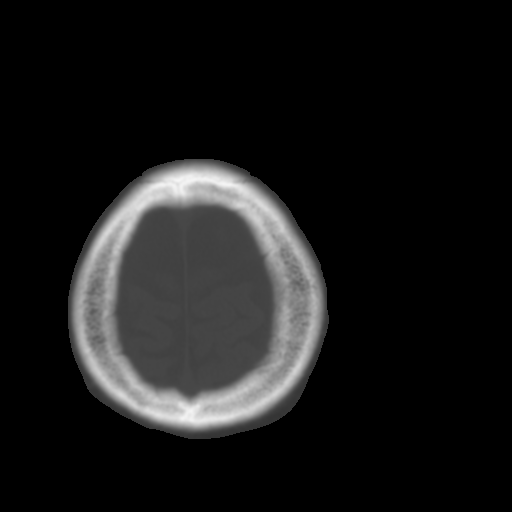
[im 29/36  brain]
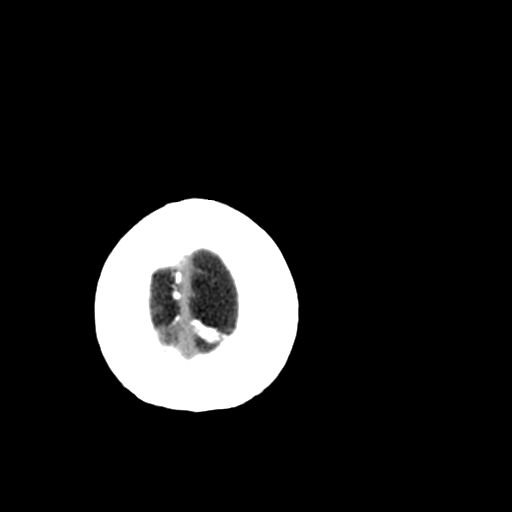
[im 32/36  brain]
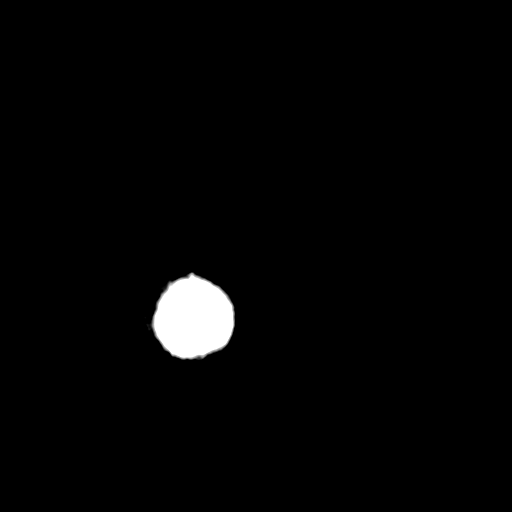
[im 34/36  brain]
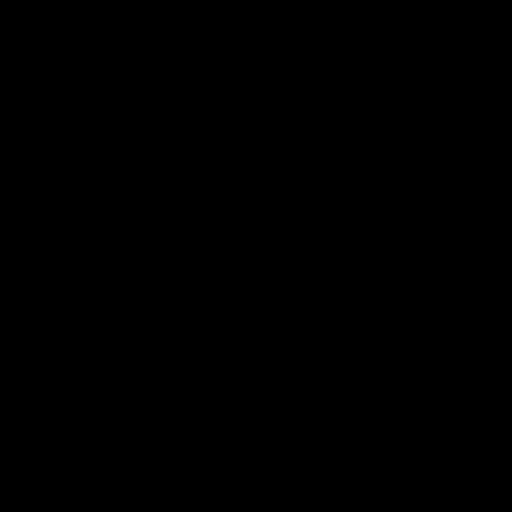

[16 of 30 positions shown; findings below may reference images not displayed]

FINDINGS: Ventricles are normal size for this patient's age and normal in
configuration.

No parenchymal masses or mass effect. There are no areas of abnormal
parenchymal attenuation. No evidence of a cortical infarct.

There are no extra-axial masses or abnormal fluid collections.
Skullbase vascular calcifications described previously are stable.

No intracranial hemorrhage.

Visualized sinuses, mastoid air cells and middle ear cavities are
clear.
IMPRESSION: 1. No acute intracranial abnormalities. No change from the prior
study.

## 2015-06-11 ENCOUNTER — Telehealth (HOSPITAL_COMMUNITY): Payer: Self-pay | Admitting: Vascular Surgery

## 2015-06-13 DIAGNOSIS — I214 Non-ST elevation (NSTEMI) myocardial infarction: Secondary | ICD-10-CM | POA: Diagnosis not present

## 2015-06-13 DIAGNOSIS — N184 Chronic kidney disease, stage 4 (severe): Secondary | ICD-10-CM | POA: Diagnosis not present

## 2015-06-13 DIAGNOSIS — I5023 Acute on chronic systolic (congestive) heart failure: Secondary | ICD-10-CM | POA: Diagnosis not present

## 2015-06-13 DIAGNOSIS — I480 Paroxysmal atrial fibrillation: Secondary | ICD-10-CM | POA: Diagnosis not present

## 2015-06-13 DIAGNOSIS — E1122 Type 2 diabetes mellitus with diabetic chronic kidney disease: Secondary | ICD-10-CM | POA: Diagnosis not present

## 2015-06-13 DIAGNOSIS — I2510 Atherosclerotic heart disease of native coronary artery without angina pectoris: Secondary | ICD-10-CM | POA: Diagnosis not present

## 2015-06-13 NOTE — Telephone Encounter (Signed)
Encounter open in error 

## 2015-06-18 DIAGNOSIS — I480 Paroxysmal atrial fibrillation: Secondary | ICD-10-CM | POA: Diagnosis not present

## 2015-06-18 DIAGNOSIS — I2510 Atherosclerotic heart disease of native coronary artery without angina pectoris: Secondary | ICD-10-CM | POA: Diagnosis not present

## 2015-06-18 DIAGNOSIS — I214 Non-ST elevation (NSTEMI) myocardial infarction: Secondary | ICD-10-CM | POA: Diagnosis not present

## 2015-06-18 DIAGNOSIS — I5023 Acute on chronic systolic (congestive) heart failure: Secondary | ICD-10-CM | POA: Diagnosis not present

## 2015-06-18 DIAGNOSIS — E1122 Type 2 diabetes mellitus with diabetic chronic kidney disease: Secondary | ICD-10-CM | POA: Diagnosis not present

## 2015-06-18 DIAGNOSIS — N184 Chronic kidney disease, stage 4 (severe): Secondary | ICD-10-CM | POA: Diagnosis not present

## 2015-06-19 DIAGNOSIS — I255 Ischemic cardiomyopathy: Secondary | ICD-10-CM | POA: Diagnosis not present

## 2015-06-19 DIAGNOSIS — Z1389 Encounter for screening for other disorder: Secondary | ICD-10-CM | POA: Diagnosis not present

## 2015-06-19 DIAGNOSIS — E1140 Type 2 diabetes mellitus with diabetic neuropathy, unspecified: Secondary | ICD-10-CM | POA: Diagnosis not present

## 2015-06-19 DIAGNOSIS — I2510 Atherosclerotic heart disease of native coronary artery without angina pectoris: Secondary | ICD-10-CM | POA: Diagnosis not present

## 2015-06-19 DIAGNOSIS — E1129 Type 2 diabetes mellitus with other diabetic kidney complication: Secondary | ICD-10-CM | POA: Diagnosis not present

## 2015-06-19 DIAGNOSIS — Z6829 Body mass index (BMI) 29.0-29.9, adult: Secondary | ICD-10-CM | POA: Diagnosis not present

## 2015-06-19 DIAGNOSIS — E663 Overweight: Secondary | ICD-10-CM | POA: Diagnosis not present

## 2015-06-19 DIAGNOSIS — I4891 Unspecified atrial fibrillation: Secondary | ICD-10-CM | POA: Diagnosis not present

## 2015-07-03 IMAGING — US US ABDOMEN COMPLETE
1 series · 13 of 25 positions shown · non-contrast
Comparison: CT ABD/PELVIS W CM dated 05/26/2010; SP BIOPSY CORE
PROSTATE dated 06/27/2007

CLINICAL DATA: Nausea.

EXAM:
ULTRASOUND ABDOMEN COMPLETE

[Series 1: us abdomen complete · 0.22mm/px · 13 of 94 slices shown]
[im 1/94]
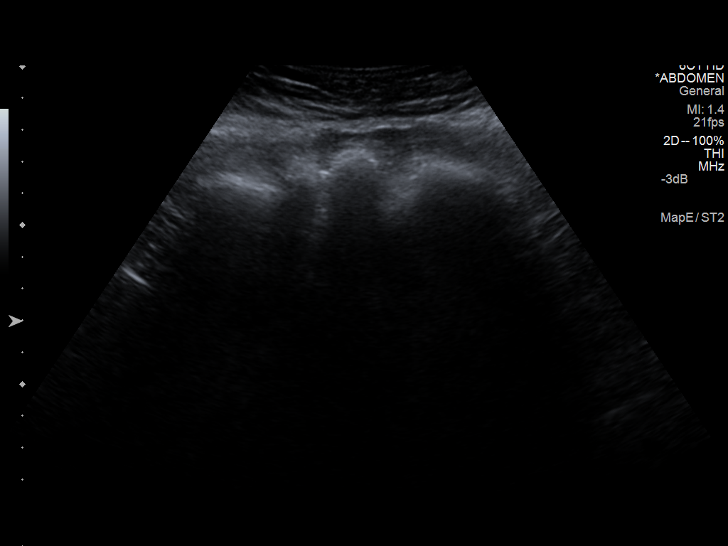
[im 8/94]
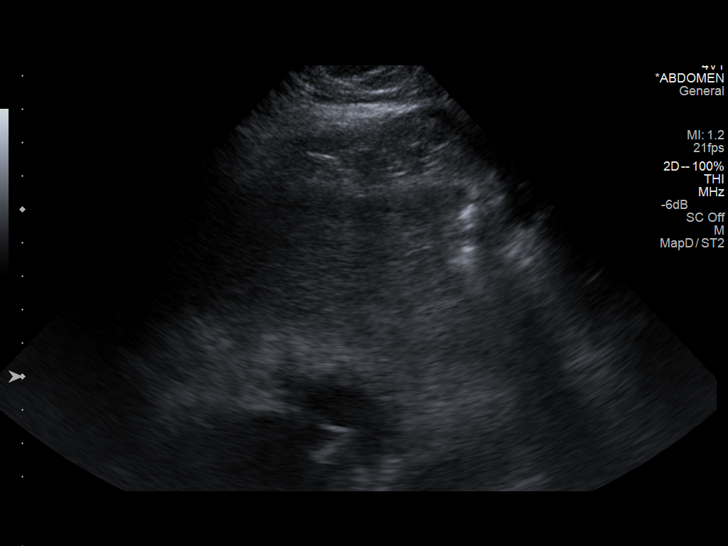
[im 16/94]
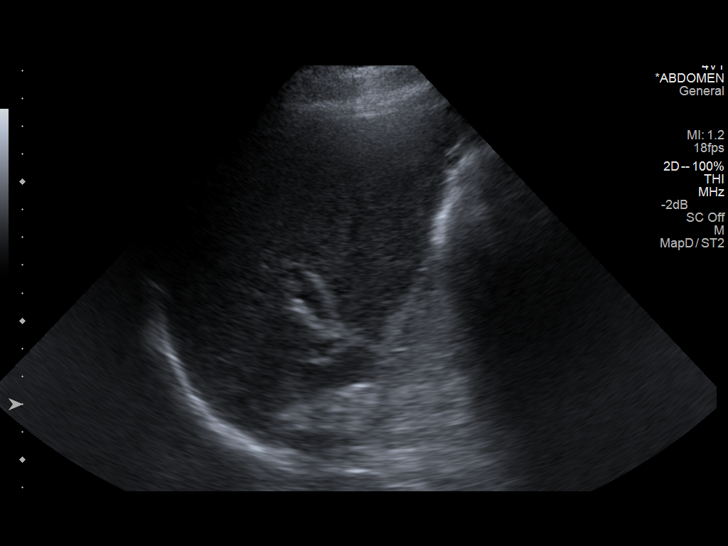
[im 24/94]
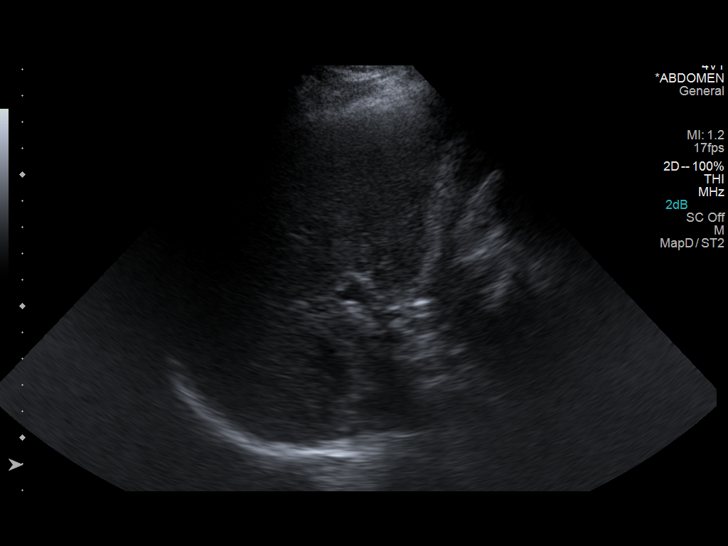
[im 32/94]
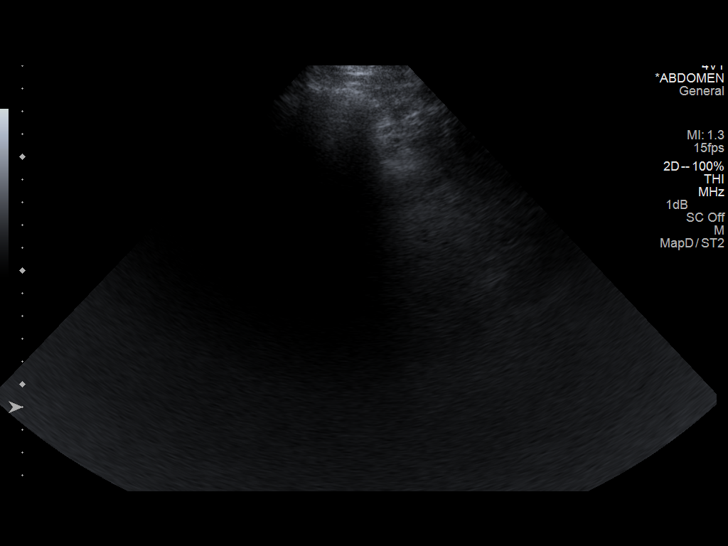
[im 39/94]
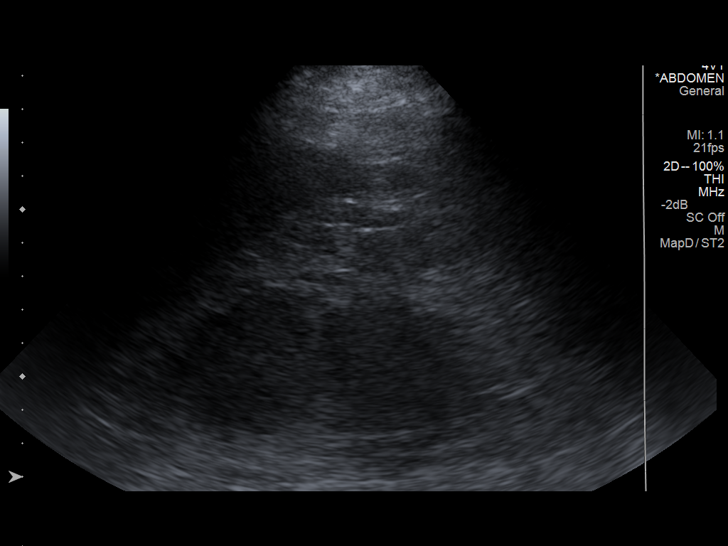
[im 47/94]
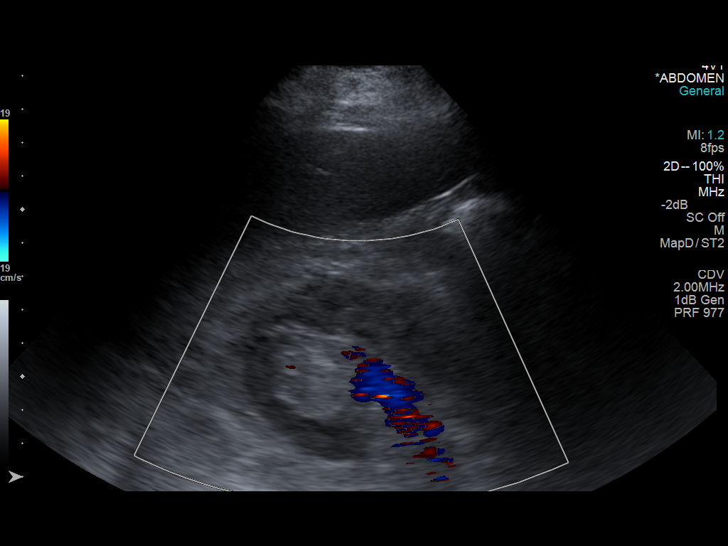
[im 55/94]
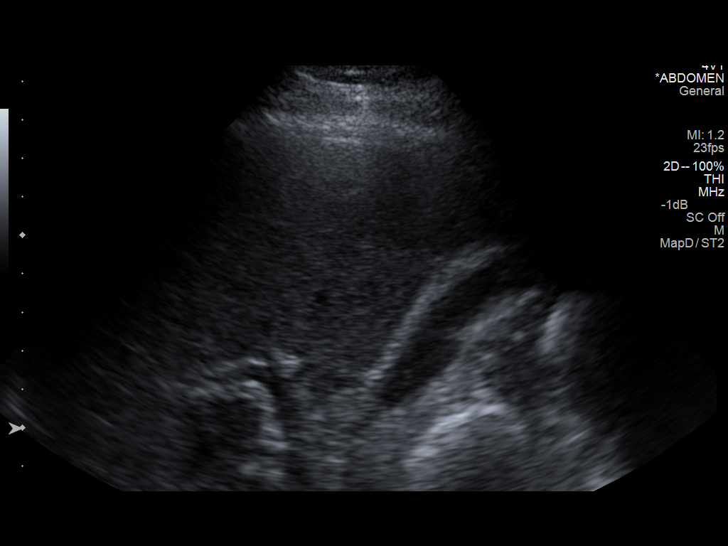
[im 63/94]
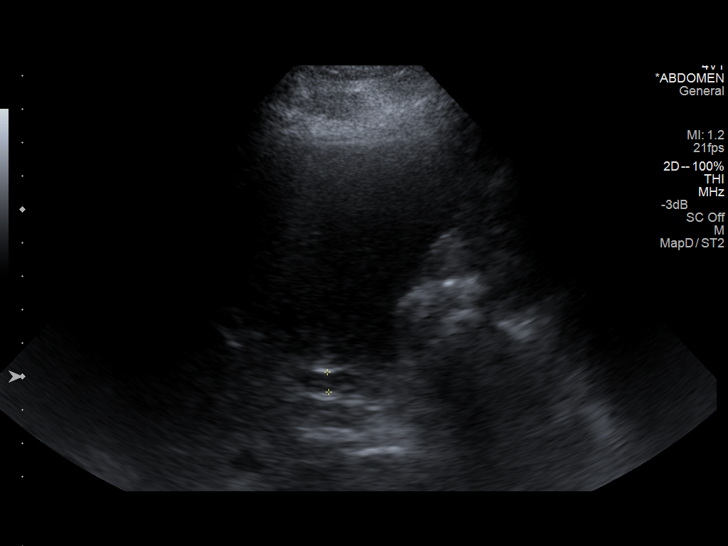
[im 70/94]
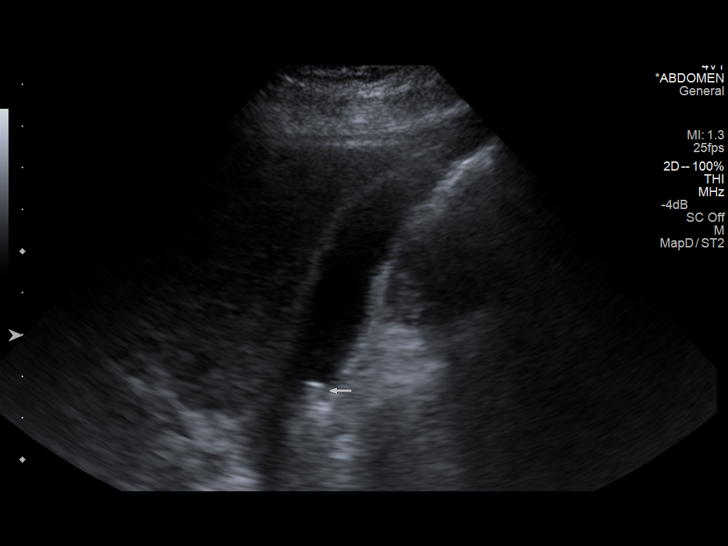
[im 78/94]
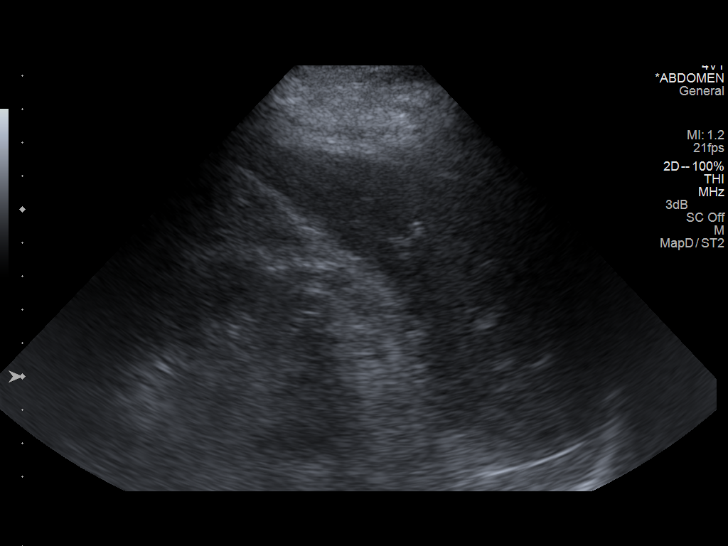
[im 86/94]
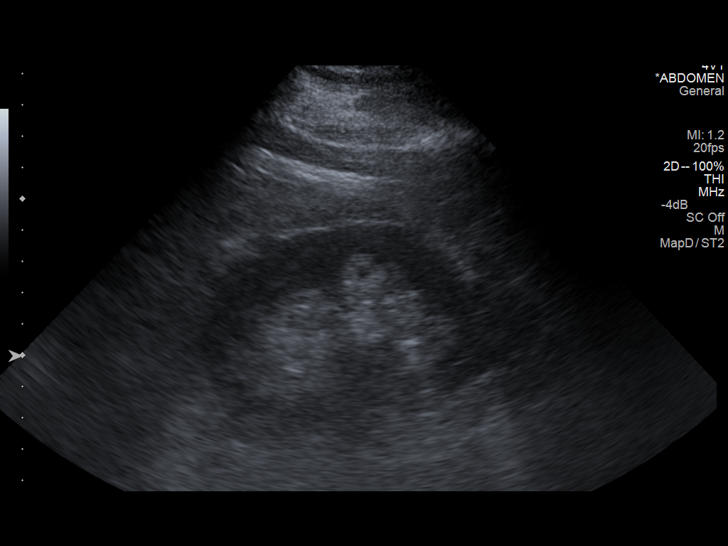
[im 94/94]
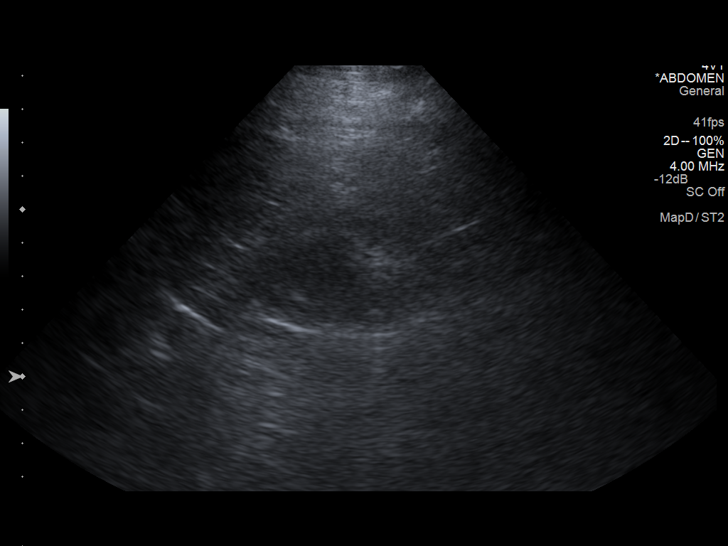

[13 of 25 positions shown; findings below may reference images not displayed]

FINDINGS: Gallbladder:

5 mm echogenic focus without shadowing is noted in the gallbladder.
This could represent nonshadowing stone or polyp. This was non
mobile. Thickening of the gallbladder wall to 5.1 mm noted. Acute or
chronic cholecystitis cannot be excluded. Ultrasound Murphy's sign
is negative. No pericholecystic fluid collections are present.

Common bile duct:

Diameter: 6 mm.

Liver:

Echogenic consistent with fatty infiltration and/or hepatocellular
disease.

IVC:

No abnormality visualized.

Pancreas:

Visualized portion unremarkable.

Spleen:

Size and appearance within normal limits.

Right Kidney:

Length: 9.9 cm. Echogenicity within normal limits. No mass or
hydronephrosis visualized.

Left Kidney:

Length: 9.2 cm. Echogenicity within normal limits. No mass or
hydronephrosis visualized.

Abdominal aorta:

No aneurysm visualized.

Other findings:

None.
IMPRESSION: 1. Echogenic non mobile focus in the gallbladder. This could
represent a nonshadowing stone or polyp.
2. Thickened gallbladder wall. Cholecystitis cannot be excluded.
Ultrasound Murphy sign is negative. No pericholecystic fluid .

## 2015-07-04 DIAGNOSIS — I214 Non-ST elevation (NSTEMI) myocardial infarction: Secondary | ICD-10-CM | POA: Diagnosis not present

## 2015-07-04 DIAGNOSIS — I5023 Acute on chronic systolic (congestive) heart failure: Secondary | ICD-10-CM | POA: Diagnosis not present

## 2015-07-04 DIAGNOSIS — E1122 Type 2 diabetes mellitus with diabetic chronic kidney disease: Secondary | ICD-10-CM | POA: Diagnosis not present

## 2015-07-04 DIAGNOSIS — N184 Chronic kidney disease, stage 4 (severe): Secondary | ICD-10-CM | POA: Diagnosis not present

## 2015-07-04 DIAGNOSIS — I2510 Atherosclerotic heart disease of native coronary artery without angina pectoris: Secondary | ICD-10-CM | POA: Diagnosis not present

## 2015-07-04 DIAGNOSIS — I480 Paroxysmal atrial fibrillation: Secondary | ICD-10-CM | POA: Diagnosis not present

## 2015-07-10 IMAGING — CR DG CHEST 1V PORT
1 series · 1 of 1 positions shown · non-contrast
Comparison: Chest x-ray 01/26/2014.

CLINICAL DATA: Left-sided weakness.

EXAM:
PORTABLE CHEST - 1 VIEW

[portable]
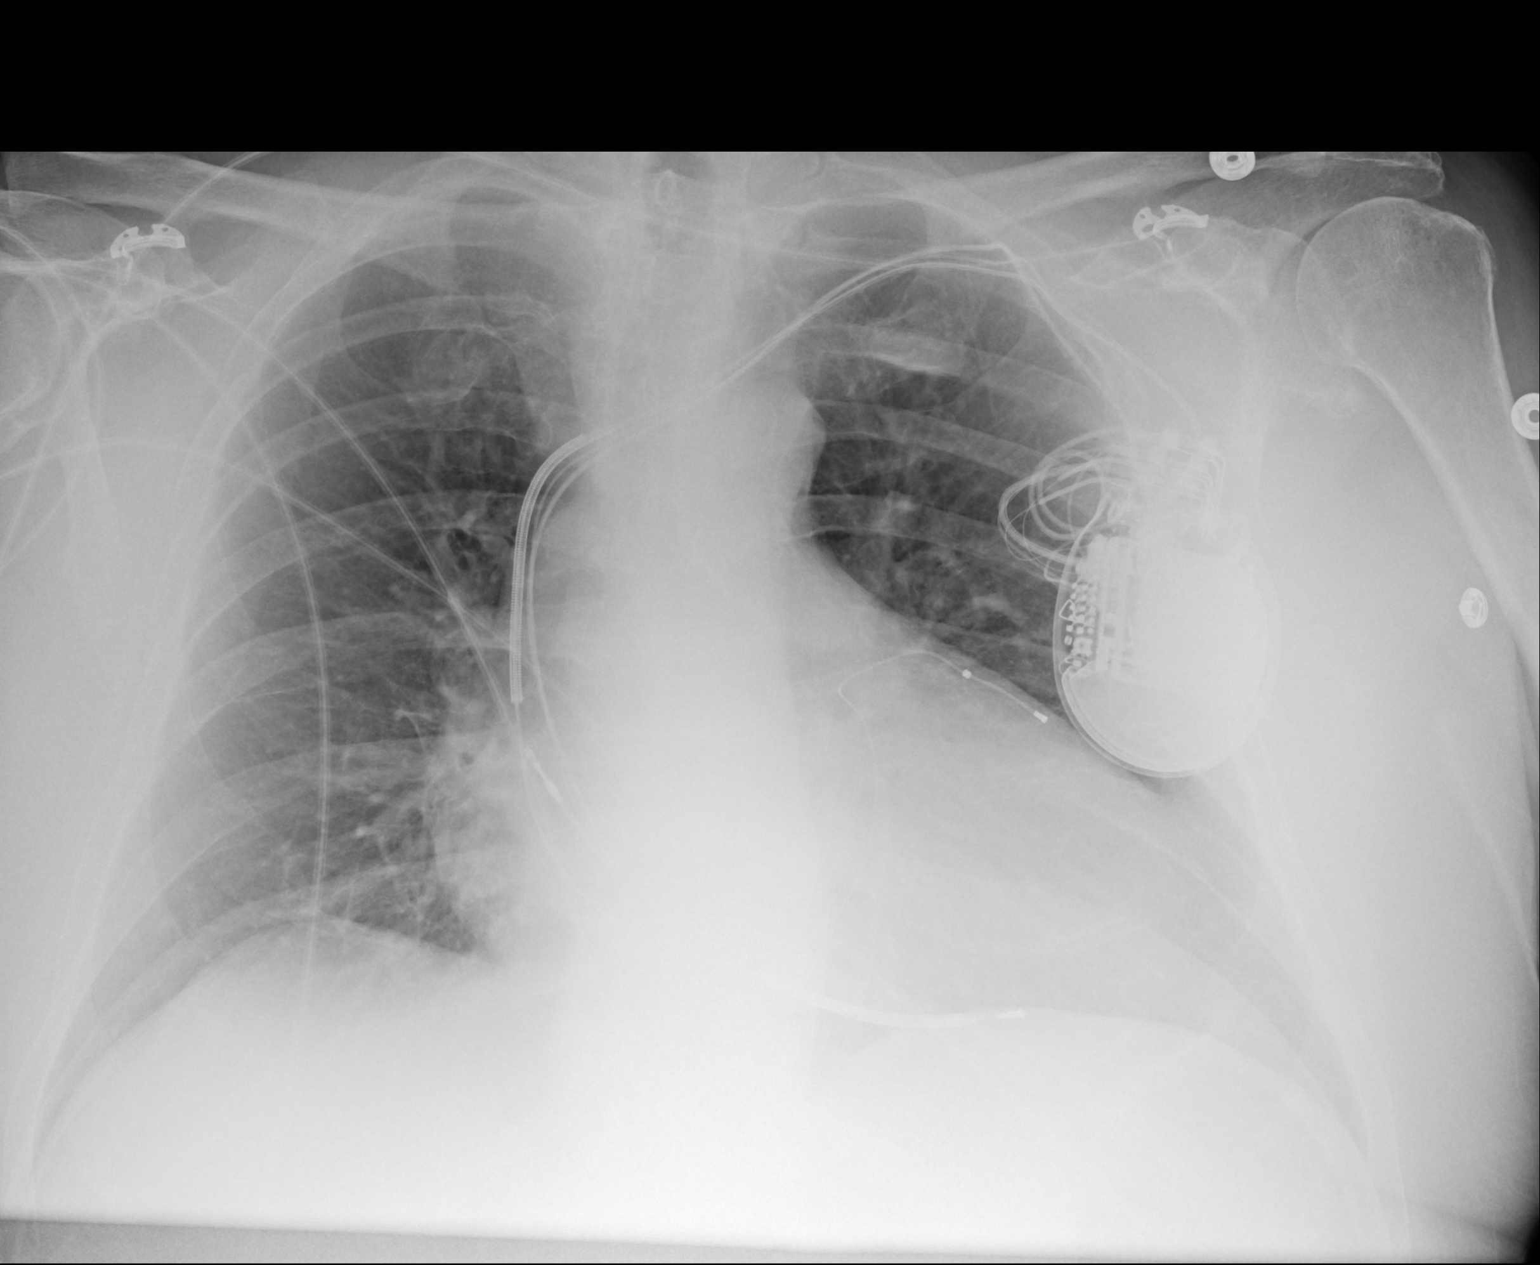

[1 of 1 positions shown; findings below may reference images not displayed]

FINDINGS: Lung volumes are normal. No consolidative airspace disease. No
pleural effusions. No evidence of pulmonary edema. Heart size is
mildly enlarged (unchanged). Left-sided biventricular pacemaker/
AICD with lead tips projecting over the expected location of the
right atrium, anterior wall of the left ventricle via the coronary
sinus and coronary veins, and the right ventricle.
IMPRESSION: 1. No radiographic evidence of acute cardiopulmonary disease.
2. Mild cardiomegaly.

## 2015-07-14 ENCOUNTER — Ambulatory Visit (HOSPITAL_COMMUNITY)
Admission: RE | Admit: 2015-07-14 | Discharge: 2015-07-14 | Disposition: A | Discharge status: Home / Self Care | Payer: Medicare Other | Source: Ambulatory Visit | Attending: Internal Medicine | Admitting: Internal Medicine

## 2015-07-14 VITALS — BP 88/58 | HR 73 | Wt 198.0 lb

## 2015-07-14 DIAGNOSIS — Z8249 Family history of ischemic heart disease and other diseases of the circulatory system: Secondary | ICD-10-CM | POA: Diagnosis not present

## 2015-07-14 DIAGNOSIS — I252 Old myocardial infarction: Secondary | ICD-10-CM | POA: Diagnosis not present

## 2015-07-14 DIAGNOSIS — Z95810 Presence of automatic (implantable) cardiac defibrillator: Secondary | ICD-10-CM | POA: Insufficient documentation

## 2015-07-14 DIAGNOSIS — K219 Gastro-esophageal reflux disease without esophagitis: Secondary | ICD-10-CM | POA: Insufficient documentation

## 2015-07-14 DIAGNOSIS — I5022 Chronic systolic (congestive) heart failure: Secondary | ICD-10-CM | POA: Diagnosis not present

## 2015-07-14 DIAGNOSIS — I509 Heart failure, unspecified: Secondary | ICD-10-CM | POA: Diagnosis not present

## 2015-07-14 DIAGNOSIS — I5020 Unspecified systolic (congestive) heart failure: Secondary | ICD-10-CM | POA: Diagnosis not present

## 2015-07-14 DIAGNOSIS — Z8546 Personal history of malignant neoplasm of prostate: Secondary | ICD-10-CM | POA: Insufficient documentation

## 2015-07-14 DIAGNOSIS — Z955 Presence of coronary angioplasty implant and graft: Secondary | ICD-10-CM | POA: Insufficient documentation

## 2015-07-14 DIAGNOSIS — Z794 Long term (current) use of insulin: Secondary | ICD-10-CM | POA: Diagnosis not present

## 2015-07-14 DIAGNOSIS — E785 Hyperlipidemia, unspecified: Secondary | ICD-10-CM | POA: Insufficient documentation

## 2015-07-14 DIAGNOSIS — Z9221 Personal history of antineoplastic chemotherapy: Secondary | ICD-10-CM | POA: Diagnosis not present

## 2015-07-14 DIAGNOSIS — Z8673 Personal history of transient ischemic attack (TIA), and cerebral infarction without residual deficits: Secondary | ICD-10-CM | POA: Diagnosis not present

## 2015-07-14 DIAGNOSIS — G4733 Obstructive sleep apnea (adult) (pediatric): Secondary | ICD-10-CM | POA: Insufficient documentation

## 2015-07-14 DIAGNOSIS — I480 Paroxysmal atrial fibrillation: Secondary | ICD-10-CM | POA: Diagnosis not present

## 2015-07-14 DIAGNOSIS — I2510 Atherosclerotic heart disease of native coronary artery without angina pectoris: Secondary | ICD-10-CM | POA: Insufficient documentation

## 2015-07-14 DIAGNOSIS — Z7982 Long term (current) use of aspirin: Secondary | ICD-10-CM | POA: Diagnosis not present

## 2015-07-14 DIAGNOSIS — E1122 Type 2 diabetes mellitus with diabetic chronic kidney disease: Secondary | ICD-10-CM | POA: Insufficient documentation

## 2015-07-14 DIAGNOSIS — Z85048 Personal history of other malignant neoplasm of rectum, rectosigmoid junction, and anus: Secondary | ICD-10-CM | POA: Insufficient documentation

## 2015-07-14 DIAGNOSIS — I129 Hypertensive chronic kidney disease with stage 1 through stage 4 chronic kidney disease, or unspecified chronic kidney disease: Secondary | ICD-10-CM | POA: Diagnosis not present

## 2015-07-14 DIAGNOSIS — I255 Ischemic cardiomyopathy: Secondary | ICD-10-CM | POA: Insufficient documentation

## 2015-07-14 DIAGNOSIS — N189 Chronic kidney disease, unspecified: Secondary | ICD-10-CM | POA: Insufficient documentation

## 2015-07-14 DIAGNOSIS — Z79899 Other long term (current) drug therapy: Secondary | ICD-10-CM | POA: Insufficient documentation

## 2015-07-14 DIAGNOSIS — Z923 Personal history of irradiation: Secondary | ICD-10-CM | POA: Diagnosis not present

## 2015-07-14 DIAGNOSIS — Z7902 Long term (current) use of antithrombotics/antiplatelets: Secondary | ICD-10-CM | POA: Diagnosis not present

## 2015-07-14 LAB — COMPREHENSIVE METABOLIC PANEL
ALBUMIN: 3.2 g/dL — AB (ref 3.5–5.0)
ALK PHOS: 73 U/L (ref 38–126)
ALT: 20 U/L (ref 17–63)
ANION GAP: 10 (ref 5–15)
AST: 29 U/L (ref 15–41)
BILIRUBIN TOTAL: 1.2 mg/dL (ref 0.3–1.2)
BUN: 53 mg/dL — AB (ref 6–20)
CALCIUM: 9.6 mg/dL (ref 8.9–10.3)
CHLORIDE: 106 mmol/L (ref 101–111)
CO2: 23 mmol/L (ref 22–32)
CREATININE: 3.34 mg/dL — AB (ref 0.61–1.24)
GFR calc non Af Amer: 19 mL/min — ABNORMAL LOW (ref >60)
GFR, EST AFRICAN AMERICAN: 22 mL/min — AB (ref >60)
Glucose, Bld: 65 mg/dL (ref 65–99)
Potassium: 4.3 mmol/L (ref 3.5–5.1)
Sodium: 139 mmol/L (ref 135–145)
Total Protein: 7.5 g/dL (ref 6.5–8.1)

## 2015-07-14 LAB — TSH: TSH: 4.697 u[IU]/mL — ABNORMAL HIGH (ref 0.350–4.500)

## 2015-07-14 LAB — T4, FREE: Free T4: 1.32 ng/dL — ABNORMAL HIGH (ref 0.61–1.12)

## 2015-07-14 NOTE — Patient Instructions (Addendum)
STOP Ranexa and Pacerone  Labs today   Your physician recommends that you schedule a follow-up appointment in: 3 months  Do the following things EVERYDAY: 1) Weigh yourself in the morning before breakfast. Write it down and keep it in a log. 2) Take your medicines as prescribed 3) Eat low salt foods-Limit salt (sodium) to 2000 mg per day.  4) Stay as active as you can everyday 5) Limit all fluids for the day to less than 2 liters 6)

## 2015-07-14 NOTE — Progress Notes (Signed)
Patient ID: [name], male   DOB: February 18, 1956, 59 y.o.   MRN: [id number]  PCP: Dr. [name]  59 yo with history of CAD and ischemic cardiomyopathy, BiV ICD upgrade 2009, paroxysmal atrial fibrillation, CKD, and traumatic SAH in 1/16 after a fall.  He has been followed at the heart failure clinic at [facility] by Dr Devonne Doughty in the past.    He was admitted in 3/16 from the [facility] ER with exertional dyspnea/volume overload and had a cardiac arrest/ventricular fibrillation while in the hospital terminated by his ICD.  After diuresis, RHC showed relatively normal filling pressures and preserved cardiac index.  Creatinine peaked at 3.0. (was 5.0 in 1/16)Echo in 3/16 showed EF 25-30% with restrictive diastolic function.  Cardiolite was done, showing areas of scar but no ischemia.  EF 18%.    Admitted 4/16 with hematuria and elevated LFTs. Questionable amiodarone toxicity and this was stopped.   In 5/16 Underwent elective BiV ICD generator change which went without complication. He was admitted again 04/23/15 with acute on chronic CHF and respiratory failure. During thatadmission he ruled in for a NSTEMI- Troponin 4.29. He has chronic stage 4 CRI and it was decided to obtain a Myoview before considering a cath. Myoview done 04/26/15 showed EF 20% .Large region of prior infarct involving the apical anterior, anteroseptal, inferoseptal and inferior walls with extension to the true apex. Small region of reversible/inducible ischemia along the margin of the prior infarct at the mid ventricular anterior and anteroseptal walls. No change from previous. Also during that hospitalization he was seen in consult by the GI service. Dr Leone Payor felt we could resume his Amiodarone with plans to follow his LFTs. BP very low in hospital  Enalapril switched to bidil. Toprol 25 stopped  He returns for HF follow up. Feels good overall. Breathing has been good. Has been able to do steps with less of a problem.  Fatigue with  exertion more of a problem than breathing. No further chest pains.  Following with nephrology now; Dr. Fausto Skillern. At home BP in 90s. Has had some lightheadedness and dizziness, worse from sitting to standing. No near syncope.  Has had some blood blisters on his hands. Weights at home 196 +/- 1 lb.  ICD interrogation: Impedance going up (higher is drier), No VT. Chronic AF  Echo 5/16 EF 35%  Labs (3/16): K 3.8, creatinine 2.78 Labs (03/10/2015): K 4.5 Creatinine 2.49  TSH 4.58, LFTs normal, digoxin 0.8 Labs (04/26/15): K 3.5 creatinine 2.13  PMH: 1. HTN 2. Type II diabetes 3. CAD: s/p BMS LAD in 2001, PTCA ramus and BMS LAD in 2009.  Cardiolite (3/16) with EF 18%, no ischemia, prior anterior, apical and inferior infarction.  4. Chronic systolic CHF: Ischemic CMP.  St Jude CRT-D.  Echo (3/16) with EF 25-30%, restrictive diastolic function, mild LVH, mild MR.  RHC (3/16) with mean RA 9, PA 47/29, mean PCWP 16, CI 2.5 (Fick).  Suspect ACEI cough.  5. SAH: 1/16 after fall (traumatic).   6. GERD 7. Atrial fibrillation: Paroxysmal, on amiodarone. 8. Rectal cancer: s/p surgery. Has colostomy.  9. Prostate cancer: s/p chemo/radiation.  10. H/o TIA 11. OSA: On CPAP.  12. Hyperlipidemia 13. CKD  SH: Married, lives in [place], nonsmoker.   FH: CAD  ROS: All systems reviewed and negative except as per HPI.   Current Outpatient Prescriptions  Medication Sig Dispense Refill  . amiodarone (PACERONE) 200 MG tablet Take 1 tablet (200 mg total) by mouth daily. 90  tablet 11  . apixaban (ELIQUIS) 5 MG TABS tablet Take 5 mg by mouth 2 (two) times daily.    Marland Kitchen aspirin EC 81 MG tablet Take 81 mg by mouth daily.    Marland Kitchen co-enzyme Q-10 50 MG capsule Take 50 mg by mouth every morning.     . fluticasone (FLONASE) 50 MCG/ACT nasal spray Place 1 spray into both nostrils daily as needed for allergies.     . furosemide (LASIX) 40 MG tablet Take 1 tablet (40 mg total) by mouth daily. 30 tablet 11  . glimepiride  (AMARYL) 1 MG tablet Take 1 mg by mouth daily with breakfast.    . Insulin Glargine (TOUJEO SOLOSTAR [place]) Inject 30 Units into the skin every morning.    . isosorbide-hydrALAZINE (BIDIL) 20-37.5 MG per tablet Take 0.5 tablets by mouth 3 (three) times daily. 100 tablet 5  . metoprolol tartrate (LOPRESSOR) 25 MG tablet Take 1 tablet (25 mg total) by mouth 2 (two) times daily. 60 tablet 3  . nitroGLYCERIN (NITROLINGUAL) 0.4 MG/SPRAY spray Place 1 spray under the tongue every 5 (five) minutes as needed. angina 12 g 3  . pantoprazole (PROTONIX) 40 MG tablet Take 1 tablet (40 mg total) by mouth daily. 30 tablet 0  . ranolazine (RANEXA) 500 MG 12 hr tablet Take 1 tablet (500 mg total) by mouth 2 (two) times daily. 60 tablet 6  . rosuvastatin (CRESTOR) 40 MG tablet Take 1 tablet (40 mg total) by mouth daily at 6 PM. 90 tablet 11   No current facility-administered medications for this encounter.    BP 88/58 mmHg  Pulse 73  Wt 198 lb (89.812 kg)  SpO2 100%  Orthostatics : sitting 82/58 standing 82/56  General: NAD Neck: JVP 6, no thyromegaly or thyroid nodule.  Lungs: Clear to auscultation bilaterally with normal respiratory effort. CV: Nondisplaced PMI.  IRR, IRR 2/6 SEM RUSB.  No edema.  No carotid bruit.  Normal pedal pulses.  Abdomen: Soft, nontender, no hepatosplenomegaly, no distention. S/p colostomy.  Skin: Intact without lesions or rashes.  Neurologic: Alert and oriented x 3.  Psych: Normal affect. Extremities: No clubbing or cyanosis.  HEENT: Normal.   ECG: AF with LV pacing  Assessment/Plan: 1. Chronic systolic CHF: Ischemic cardiomyopathy.  Has been followed for a long time at [facility].  Deemed not transplant or VAD candidate with comorbities and advanced CKD. NYHA III. BiV St Jude.  - Overall improved. NYHA III. Volume status stable.  -Continue current diuretic dose - Continue Toprol 25 bid - Given CKD and previous cancer not good VAD or Tx candidate (would need heart/kidney)  2.  Atrial fibrillation: Paroxysmal.  In AF today. Interrogation shows in Afib > 80% of time. Eliquis continued. Had traumatic SAH in 1/16 but stable back on Eliquis.   - Continue amio to 200 mg daily.  Continue Toprol 25 bid.  3. CAD: Recent NSTEMI. Stress test without high ischemic burden. No cath due to renal failure. Continue medical therapy. He is on ASA 81 daily and statin. Angina controlled with Ranexa and isordil.  4. CKD: Stable creatinine most recently.   Sees Dr Kristian Covey in [place].  5. Ventricular fibrillation: s/p recent generator change. Back on Amio. LFTs stable per GI.  6. DM2 - followed by Dr. [name]  No room to move up on meds currently with low BP.     Graciella Freer PA-C 07/14/2015  Patient seen and examined with Otilio Saber, PA-C. We discussed all aspects of the encounter. I  agree with the assessment and plan as stated above.   Overall stable NYHA III symptoms. Volume status improved. Back in AF today. Will interrogate ICD. If AF burden is increasing  Would consider stopping amio and Ranexa. Continue Eliquis. Will recheck labs today. Continue to follow with Renal.   Umeka Wrench,MD 11:50 AM  Addendum:  ICD interrogation shows > 80% AF. Will stop amio and Ranexa.   Devani Odonnel,MD 11:47 PM

## 2015-07-15 DIAGNOSIS — H25013 Cortical age-related cataract, bilateral: Secondary | ICD-10-CM | POA: Diagnosis not present

## 2015-07-15 DIAGNOSIS — E119 Type 2 diabetes mellitus without complications: Secondary | ICD-10-CM | POA: Diagnosis not present

## 2015-07-15 DIAGNOSIS — Z794 Long term (current) use of insulin: Secondary | ICD-10-CM | POA: Diagnosis not present

## 2015-07-15 DIAGNOSIS — E1129 Type 2 diabetes mellitus with other diabetic kidney complication: Secondary | ICD-10-CM | POA: Diagnosis not present

## 2015-07-15 DIAGNOSIS — H2513 Age-related nuclear cataract, bilateral: Secondary | ICD-10-CM | POA: Diagnosis not present

## 2015-07-15 LAB — T3, FREE: T3, Free: 1.9 pg/mL — ABNORMAL LOW (ref 2.0–4.4)

## 2015-07-16 NOTE — Patient Outreach (Signed)
[facility]) Care Management  07/16/2015  [name] 1956/06/06 [id number]   Referral from [place] [facility], assigned [name], RN to outreach.  Thanks, [name]. [name], [place] Assistant Phone: [phone] Fax: [phone]

## 2015-07-21 ENCOUNTER — Encounter (HOSPITAL_COMMUNITY): Payer: Self-pay | Admitting: Emergency Medicine

## 2015-07-21 ENCOUNTER — Emergency Department (HOSPITAL_COMMUNITY): Payer: Medicare Other

## 2015-07-21 ENCOUNTER — Inpatient Hospital Stay (HOSPITAL_COMMUNITY)
Admission: EM | Admit: 2015-07-21 | Discharge: 2015-07-24 | DRG: 291 | Disposition: A | Discharge status: Home / Self Care | Payer: Medicare Other | Attending: Internal Medicine | Admitting: Internal Medicine

## 2015-07-21 DIAGNOSIS — I255 Ischemic cardiomyopathy: Secondary | ICD-10-CM | POA: Diagnosis present

## 2015-07-21 DIAGNOSIS — J189 Pneumonia, unspecified organism: Secondary | ICD-10-CM | POA: Diagnosis not present

## 2015-07-21 DIAGNOSIS — Z9221 Personal history of antineoplastic chemotherapy: Secondary | ICD-10-CM

## 2015-07-21 DIAGNOSIS — K5900 Constipation, unspecified: Secondary | ICD-10-CM | POA: Diagnosis present

## 2015-07-21 DIAGNOSIS — Z79899 Other long term (current) drug therapy: Secondary | ICD-10-CM

## 2015-07-21 DIAGNOSIS — Z933 Colostomy status: Secondary | ICD-10-CM | POA: Diagnosis not present

## 2015-07-21 DIAGNOSIS — IMO0002 Reserved for concepts with insufficient information to code with codable children: Secondary | ICD-10-CM | POA: Diagnosis present

## 2015-07-21 DIAGNOSIS — E1122 Type 2 diabetes mellitus with diabetic chronic kidney disease: Secondary | ICD-10-CM | POA: Diagnosis present

## 2015-07-21 DIAGNOSIS — G4733 Obstructive sleep apnea (adult) (pediatric): Secondary | ICD-10-CM | POA: Diagnosis present

## 2015-07-21 DIAGNOSIS — R42 Dizziness and giddiness: Secondary | ICD-10-CM

## 2015-07-21 DIAGNOSIS — Z7982 Long term (current) use of aspirin: Secondary | ICD-10-CM

## 2015-07-21 DIAGNOSIS — Z7901 Long term (current) use of anticoagulants: Secondary | ICD-10-CM | POA: Diagnosis not present

## 2015-07-21 DIAGNOSIS — Z9989 Dependence on other enabling machines and devices: Secondary | ICD-10-CM

## 2015-07-21 DIAGNOSIS — N189 Chronic kidney disease, unspecified: Secondary | ICD-10-CM | POA: Diagnosis not present

## 2015-07-21 DIAGNOSIS — I5022 Chronic systolic (congestive) heart failure: Secondary | ICD-10-CM | POA: Diagnosis not present

## 2015-07-21 DIAGNOSIS — E785 Hyperlipidemia, unspecified: Secondary | ICD-10-CM | POA: Diagnosis present

## 2015-07-21 DIAGNOSIS — Z955 Presence of coronary angioplasty implant and graft: Secondary | ICD-10-CM | POA: Diagnosis not present

## 2015-07-21 DIAGNOSIS — I252 Old myocardial infarction: Secondary | ICD-10-CM | POA: Diagnosis not present

## 2015-07-21 DIAGNOSIS — I2510 Atherosclerotic heart disease of native coronary artery without angina pectoris: Secondary | ICD-10-CM | POA: Diagnosis present

## 2015-07-21 DIAGNOSIS — I482 Chronic atrial fibrillation, unspecified: Secondary | ICD-10-CM | POA: Diagnosis present

## 2015-07-21 DIAGNOSIS — R0602 Shortness of breath: Secondary | ICD-10-CM

## 2015-07-21 DIAGNOSIS — Z8546 Personal history of malignant neoplasm of prostate: Secondary | ICD-10-CM | POA: Diagnosis not present

## 2015-07-21 DIAGNOSIS — Z95810 Presence of automatic (implantable) cardiac defibrillator: Secondary | ICD-10-CM

## 2015-07-21 DIAGNOSIS — Z8673 Personal history of transient ischemic attack (TIA), and cerebral infarction without residual deficits: Secondary | ICD-10-CM | POA: Diagnosis not present

## 2015-07-21 DIAGNOSIS — K2960 Other gastritis without bleeding: Secondary | ICD-10-CM | POA: Diagnosis not present

## 2015-07-21 DIAGNOSIS — K5520 Angiodysplasia of colon without hemorrhage: Secondary | ICD-10-CM | POA: Diagnosis present

## 2015-07-21 DIAGNOSIS — I480 Paroxysmal atrial fibrillation: Secondary | ICD-10-CM | POA: Diagnosis present

## 2015-07-21 DIAGNOSIS — Z794 Long term (current) use of insulin: Secondary | ICD-10-CM

## 2015-07-21 DIAGNOSIS — I951 Orthostatic hypotension: Secondary | ICD-10-CM | POA: Diagnosis present

## 2015-07-21 DIAGNOSIS — I5023 Acute on chronic systolic (congestive) heart failure: Secondary | ICD-10-CM | POA: Diagnosis not present

## 2015-07-21 DIAGNOSIS — D649 Anemia, unspecified: Secondary | ICD-10-CM

## 2015-07-21 DIAGNOSIS — I472 Ventricular tachycardia: Secondary | ICD-10-CM | POA: Diagnosis present

## 2015-07-21 DIAGNOSIS — Z87820 Personal history of traumatic brain injury: Secondary | ICD-10-CM | POA: Diagnosis not present

## 2015-07-21 DIAGNOSIS — E1165 Type 2 diabetes mellitus with hyperglycemia: Secondary | ICD-10-CM | POA: Diagnosis present

## 2015-07-21 DIAGNOSIS — Z85048 Personal history of other malignant neoplasm of rectum, rectosigmoid junction, and anus: Secondary | ICD-10-CM

## 2015-07-21 DIAGNOSIS — D509 Iron deficiency anemia, unspecified: Secondary | ICD-10-CM | POA: Diagnosis not present

## 2015-07-21 DIAGNOSIS — Z923 Personal history of irradiation: Secondary | ICD-10-CM | POA: Diagnosis not present

## 2015-07-21 DIAGNOSIS — K219 Gastro-esophageal reflux disease without esophagitis: Secondary | ICD-10-CM | POA: Diagnosis present

## 2015-07-21 DIAGNOSIS — E1129 Type 2 diabetes mellitus with other diabetic kidney complication: Secondary | ICD-10-CM | POA: Diagnosis present

## 2015-07-21 DIAGNOSIS — D638 Anemia in other chronic diseases classified elsewhere: Secondary | ICD-10-CM | POA: Diagnosis present

## 2015-07-21 DIAGNOSIS — Y95 Nosocomial condition: Secondary | ICD-10-CM | POA: Diagnosis present

## 2015-07-21 DIAGNOSIS — N179 Acute kidney failure, unspecified: Secondary | ICD-10-CM | POA: Diagnosis not present

## 2015-07-21 DIAGNOSIS — I509 Heart failure, unspecified: Secondary | ICD-10-CM | POA: Diagnosis not present

## 2015-07-21 DIAGNOSIS — N184 Chronic kidney disease, stage 4 (severe): Secondary | ICD-10-CM | POA: Diagnosis present

## 2015-07-21 DIAGNOSIS — J984 Other disorders of lung: Secondary | ICD-10-CM | POA: Diagnosis not present

## 2015-07-21 DIAGNOSIS — K5909 Other constipation: Secondary | ICD-10-CM | POA: Diagnosis not present

## 2015-07-21 DIAGNOSIS — I129 Hypertensive chronic kidney disease with stage 1 through stage 4 chronic kidney disease, or unspecified chronic kidney disease: Secondary | ICD-10-CM | POA: Diagnosis present

## 2015-07-21 DIAGNOSIS — K2970 Gastritis, unspecified, without bleeding: Secondary | ICD-10-CM | POA: Diagnosis present

## 2015-07-21 HISTORY — DX: Personal history of other medical treatment: Z92.89

## 2015-07-21 HISTORY — DX: Pneumonia, unspecified organism: J18.9

## 2015-07-21 HISTORY — DX: Presence of automatic (implantable) cardiac defibrillator: Z95.810

## 2015-07-21 HISTORY — DX: Anemia, unspecified: D64.9

## 2015-07-21 LAB — CBG MONITORING, ED
GLUCOSE-CAPILLARY: 66 mg/dL (ref 65–99)
GLUCOSE-CAPILLARY: 117 mg/dL — AB (ref 65–99)
GLUCOSE-CAPILLARY: 92 mg/dL (ref 65–99)

## 2015-07-21 LAB — CBC WITH DIFFERENTIAL/PLATELET
BASOS ABS: 0 10*3/uL (ref 0.0–0.1)
Basophils Relative: 0 % (ref 0–1)
EOS ABS: 0.2 10*3/uL (ref 0.0–0.7)
EOS PCT: 1 % (ref 0–5)
HCT: 20.6 % — ABNORMAL LOW (ref 39.0–52.0)
Hemoglobin: 7 g/dL — ABNORMAL LOW (ref 13.0–17.0)
Lymphocytes Relative: 14 % (ref 12–46)
Lymphs Abs: 1.6 10*3/uL (ref 0.7–4.0)
MCH: 32.6 pg (ref 26.0–34.0)
MCHC: 34 g/dL (ref 30.0–36.0)
MCV: 95.8 fL (ref 78.0–100.0)
Monocytes Absolute: 1.2 10*3/uL — ABNORMAL HIGH (ref 0.1–1.0)
Monocytes Relative: 11 % (ref 3–12)
Neutro Abs: 8.4 10*3/uL — ABNORMAL HIGH (ref 1.7–7.7)
Neutrophils Relative %: 74 % (ref 43–77)
PLATELETS: 176 10*3/uL (ref 150–400)
RBC: 2.15 MIL/uL — AB (ref 4.22–5.81)
RDW: 16.6 % — AB (ref 11.5–15.5)
WBC: 11.3 10*3/uL — AB (ref 4.0–10.5)

## 2015-07-21 LAB — TROPONIN I: Troponin I: <0.03 ng/mL (ref <0.031)

## 2015-07-21 LAB — BASIC METABOLIC PANEL
ANION GAP: 12 (ref 5–15)
BUN: 55 mg/dL — ABNORMAL HIGH (ref 6–20)
CALCIUM: 9.4 mg/dL (ref 8.9–10.3)
CHLORIDE: 100 mmol/L — AB (ref 101–111)
CO2: 23 mmol/L (ref 22–32)
CREATININE: 3.14 mg/dL — AB (ref 0.61–1.24)
GFR calc non Af Amer: 20 mL/min — ABNORMAL LOW (ref >60)
GFR, EST AFRICAN AMERICAN: 23 mL/min — AB (ref >60)
GLUCOSE: 91 mg/dL (ref 65–99)
Potassium: 4.7 mmol/L (ref 3.5–5.1)
Sodium: 135 mmol/L (ref 135–145)

## 2015-07-21 LAB — STREP PNEUMONIAE URINARY ANTIGEN: STREP PNEUMO URINARY ANTIGEN: NEGATIVE

## 2015-07-21 LAB — PREPARE RBC (CROSSMATCH)

## 2015-07-21 LAB — POC OCCULT BLOOD, ED: FECAL OCCULT BLD: NEGATIVE

## 2015-07-21 LAB — GLUCOSE, CAPILLARY: GLUCOSE-CAPILLARY: 111 mg/dL — AB (ref 65–99)

## 2015-07-21 LAB — BRAIN NATRIURETIC PEPTIDE: B NATRIURETIC PEPTIDE 5: 781.6 pg/mL — AB (ref 0.0–100.0)

## 2015-07-21 MED ORDER — ASPIRIN EC 81 MG PO TBEC
81.0000 mg | DELAYED_RELEASE_TABLET | Freq: Every day | ORAL | Status: DC
  Administered 2015-07-21 – 2015-07-24 (×4): 81 mg via ORAL
  Filled 2015-07-21 (×4): qty 1

## 2015-07-21 MED ORDER — SODIUM CHLORIDE 0.9 % IJ SOLN
3.0000 mL | Freq: Two times a day (BID) | INTRAMUSCULAR | Status: DC
  Administered 2015-07-22: 3 mL via INTRAVENOUS

## 2015-07-21 MED ORDER — DEXTROSE 5 % IV SOLN
2.0000 g | Freq: Three times a day (TID) | INTRAVENOUS | Status: DC
  Filled 2015-07-21 (×2): qty 2

## 2015-07-21 MED ORDER — SODIUM CHLORIDE 0.9 % IJ SOLN: 3.0000 mL | INTRAMUSCULAR | Status: DC | PRN

## 2015-07-21 MED ORDER — POLYETHYLENE GLYCOL 3350 17 G PO PACK
17.0000 g | PACK | Freq: Every day | ORAL | Status: DC | PRN
  Filled 2015-07-21: qty 1

## 2015-07-21 MED ORDER — APIXABAN 5 MG PO TABS
5.0000 mg | ORAL_TABLET | Freq: Two times a day (BID) | ORAL | Status: DC
  Administered 2015-07-21 – 2015-07-22 (×3): 5 mg via ORAL
  Filled 2015-07-21 (×5): qty 1

## 2015-07-21 MED ORDER — VANCOMYCIN HCL IN DEXTROSE 1-5 GM/200ML-% IV SOLN: 1000.0000 mg | Freq: Once | INTRAVENOUS | Status: DC

## 2015-07-21 MED ORDER — DOCUSATE SODIUM 100 MG PO CAPS
100.0000 mg | ORAL_CAPSULE | Freq: Two times a day (BID) | ORAL | Status: DC
  Administered 2015-07-21 – 2015-07-24 (×5): 100 mg via ORAL
  Filled 2015-07-21 (×9): qty 1

## 2015-07-21 MED ORDER — VANCOMYCIN HCL 10 G IV SOLR
2000.0000 mg | Freq: Once | INTRAVENOUS | Status: AC
  Administered 2015-07-21: 2000 mg via INTRAVENOUS
  Filled 2015-07-21: qty 2000

## 2015-07-21 MED ORDER — VANCOMYCIN HCL IN DEXTROSE 1-5 GM/200ML-% IV SOLN
1000.0000 mg | INTRAVENOUS | Status: DC
  Administered 2015-07-22 – 2015-07-23 (×2): 1000 mg via INTRAVENOUS
  Filled 2015-07-21 (×3): qty 200

## 2015-07-21 MED ORDER — CO-ENZYME Q-10 50 MG PO CAPS: 50.0000 mg | ORAL_CAPSULE | Freq: Every morning | ORAL | Status: DC

## 2015-07-21 MED ORDER — ACETAMINOPHEN 650 MG RE SUPP
650.0000 mg | Freq: Four times a day (QID) | RECTAL | Status: DC | PRN
  Filled 2015-07-21: qty 1

## 2015-07-21 MED ORDER — INSULIN GLARGINE 100 UNIT/ML ~~LOC~~ SOLN: 30.0000 [IU] | Freq: Every day | SUBCUTANEOUS | Status: DC

## 2015-07-21 MED ORDER — ACETAMINOPHEN 325 MG PO TABS: 650.0000 mg | ORAL_TABLET | Freq: Four times a day (QID) | ORAL | Status: DC | PRN

## 2015-07-21 MED ORDER — FLUTICASONE PROPIONATE 50 MCG/ACT NA SUSP
1.0000 | Freq: Every day | NASAL | Status: DC | PRN
  Administered 2015-07-22 (×2): 1 via NASAL
  Filled 2015-07-21 (×2): qty 16

## 2015-07-21 MED ORDER — SODIUM CHLORIDE 0.9 % IV SOLN
10.0000 mL/h | Freq: Once | INTRAVENOUS | Status: AC
  Administered 2015-07-21: 10 mL/h via INTRAVENOUS

## 2015-07-21 MED ORDER — INSULIN ASPART 100 UNIT/ML ~~LOC~~ SOLN
0.0000 [IU] | Freq: Three times a day (TID) | SUBCUTANEOUS | Status: DC
  Administered 2015-07-22 – 2015-07-23 (×2): 1 [IU] via SUBCUTANEOUS

## 2015-07-21 MED ORDER — INSULIN GLARGINE 100 UNIT/ML ~~LOC~~ SOLN
20.0000 [IU] | Freq: Every day | SUBCUTANEOUS | Status: DC
  Filled 2015-07-21 (×3): qty 0.2

## 2015-07-21 MED ORDER — SODIUM CHLORIDE 0.9 % IV SOLN: 250.0000 mL | INTRAVENOUS | Status: DC | PRN

## 2015-07-21 MED ORDER — METOPROLOL SUCCINATE ER 25 MG PO TB24: 25.0000 mg | ORAL_TABLET | ORAL | Status: DC

## 2015-07-21 MED ORDER — DEXTROSE 5 % IV SOLN
2.0000 g | INTRAVENOUS | Status: DC
  Administered 2015-07-21 – 2015-07-23 (×3): 2 g via INTRAVENOUS
  Filled 2015-07-21 (×4): qty 2

## 2015-07-21 MED ORDER — NITROGLYCERIN 0.4 MG/SPRAY TL SOLN
1.0000 | Status: DC | PRN
  Filled 2015-07-21: qty 4.9

## 2015-07-21 MED ORDER — PANTOPRAZOLE SODIUM 40 MG PO TBEC
40.0000 mg | DELAYED_RELEASE_TABLET | Freq: Every day | ORAL | Status: DC
  Administered 2015-07-21 – 2015-07-24 (×4): 40 mg via ORAL
  Filled 2015-07-21 (×5): qty 1

## 2015-07-21 MED ORDER — SODIUM CHLORIDE 0.9 % IJ SOLN
3.0000 mL | Freq: Two times a day (BID) | INTRAMUSCULAR | Status: DC
  Administered 2015-07-21 – 2015-07-24 (×5): 3 mL via INTRAVENOUS

## 2015-07-21 MED ORDER — ROSUVASTATIN CALCIUM 40 MG PO TABS
40.0000 mg | ORAL_TABLET | Freq: Every day | ORAL | Status: DC
  Administered 2015-07-21 – 2015-07-23 (×3): 40 mg via ORAL
  Filled 2015-07-21 (×5): qty 1

## 2015-07-21 NOTE — ED Notes (Signed)
CBG 66.  Pt given 4 ounces of soda per hypoglycemia protocol.  Will reassess in  15 minutes.

## 2015-07-21 NOTE — Progress Notes (Signed)
Received pt report from [place].

## 2015-07-21 NOTE — Progress Notes (Signed)
[name] [id number] Admission Data: 07/21/2015 8:26 PM Attending Provider: Modena Jansky, MD IYM:EBRAX,ENMMHWKG J., MD Code Status: Full  [name] is a 59 y.o. male patient admitted from ED:  -No acute distress noted.  -No complaints of shortness of breath.  -No complaints of chest pain.   Cardiac Monitoring: Box # 40 in place. Cardiac monitor yields:biventricular paced.  Blood pressure 93/68, pulse 65, temperature 97.6 F (36.4 C), temperature source Oral, resp. rate 15, height 5\' 10"  (1.778 m), weight 90.901 kg (200 lb 6.4 oz), SpO2 98 %.   IV Fluids:  IV in place, occlusive dsg intact without redness, IV cath forearm right, condition patent and no redness and left, condition patent and no redness none.   Allergies:  Review of patient's allergies indicates no known allergies.  Past Medical History:   has a past medical history of Essential hypertension, benign; CAD (coronary artery disease); Chronic systolic heart failure; GERD (gastroesophageal reflux disease); Paroxysmal atrial fibrillation; Adenocarcinoma of rectum; Prostate cancer; Biventricular ICD (implantable cardioverter-defibrillator) in place (2009); TIA (transient ischemic attack); OSA on CPAP; HLD (hyperlipidemia); Orthostatic hypotension; Dizziness; History of falling (July 2015); Ischemic cardiomyopathy; Chronic kidney disease, stage IV (severe) (12/11/2013); DM type 2, uncontrolled, with renal complications (8/81/1031); Obesity (12/12/2013); and Hematuria (03/2015).  Past Surgical History:   has past surgical history that includes Internal Defibrillator and Pacemaker (2002); Abdominal and Perineal Resection of Rectum with Total Mesorectal Excision (10/04/2007); Colonoscopy (09/14/2011); Colostomy (2008); Colonoscopy (N/A, 07/02/2014); Esophagogastroduodenoscopy (N/A, 07/02/2014); Savory dilation (N/A, 07/02/2014); maloney dilation (N/A, 07/02/2014); Portacath placement (06/2007); Cardiac catheterization (08/2001); Coronary  angioplasty with stent (2001; ~ 2006); left heart catheterization with coronary angiogram (N/A, 07/13/2013); Colonoscopy (N/A, 12/11/2014); Esophagogastroduodenoscopy (N/A, 12/11/2014); Cardiac defibrillator placement (2009); right heart catheterization (N/A, 02/24/2015); and Cardiac catheterization (N/A, 04/10/2015).  Social History:   reports that he has never smoked. He has never used smokeless tobacco. He reports that he does not drink alcohol or use illicit drugs.  Skin: charted on CHL  Patient/Family orientated to room. Information packet given to patient/family. Admission inpatient armband information verified with patient/family to include name and date of birth and placed on patient arm. Side rails up x 2, fall assessment and education completed with patient/family. Patient/family able to verbalize understanding of risk associated with falls and verbalized understanding to call for assistance before getting out of bed. Call light within reach. Patient/family able to voice and demonstrate understanding of unit orientation instructions.

## 2015-07-21 NOTE — ED Notes (Addendum)
Patient states late afternoon 07/19/15 he started to experience mild SOB, took an extra lasix dose as instructed by his primary care md.  States that evening the SOB got worse.  States SOB has gotten worse this morning.  O2 sat 99% on room air, unlabored respirations, patient in no apparent distress at this time.

## 2015-07-21 NOTE — Progress Notes (Signed)
RT set up CPAP for patient with full face mask. Patient is on 6 cmH20 and tolerating well. RT will continue to monitor.

## 2015-07-21 NOTE — ED Notes (Signed)
Pt brought back to room via wheelchair; pt undressed, in gown, on monitor, continuous pulse oximetry and blood pressure cuff; Tanisha, NT present in room

## 2015-07-21 NOTE — ED Notes (Signed)
CBG 117 

## 2015-07-21 NOTE — H&P (Signed)
[facility] History and Physical  [name] GHW:[id number] DOB: 03-11-1956 DOA: 07/21/2015  Referring physician: Dr. Rex Kras PCP: Glo Herring., MD   Chief Complaint: Shortness of breath and wheezing  HPI: [name] is a 59 y.o. male with Biventricular ICD, coronary artery disease, paroxysmal atrial fibrillation on Eliquis, type 2 diabetes, chronic kidney disease stage IV, ischemic cardiomyopathy with systolic heart failure May 2016 2d echo showing LVEF 30%, history of rectal cancer s/p colostomy, and history of prostate cancer s/p chemo/radiation presenting, presenting with progressive shortness of breath and wheezing. He initially noticed the shortness of breath and wheezing on 07/19/15, which he states is his usual presentation of a CHF exacerbation. Per his cardiologist, he took an extra dose of Lasix, but the symptoms did not improve prompting him to come to the ED.   He endorses shortness of breath at rest, DOE, wheezing, non-productive cough, URI symptoms including sore throat and nasal congestion, dizziness with positional changes. He denies fevers, chills, night sweats, and chest pain.   In the ED, vital signs are remarkable for hypotension at 98/59 (patient endorses running in the 80s/90s normally). He is afebrile. Labs are remarkable for leukocytosis (11.3K), elevated BNP (781.6), low hemoglobin (7.0), elevated creatinine (3.14). CXR showing possible right upper lobe pneumonia. EKG negative for acute ischemic changes.  Patient was subsequently admitted for further evaluation and treatment.    Review of Systems:  Constitutional: Endorses weight gain 180s to 195. Healthy appetite.  HEENT: Tooth decay. Nasal congestion and sore throat.  Cardio-vascular: Denies lower extremity edema.  GI: Denies abdominal pain, nausea, vomiting, or diarrhea. Endorses constipation. Denies hematochezia Resp: Denies tobacco history or history of COPD/asthma.  GU: Denies urinary  complaints. No hematuria or dysuria.    Past Medical History  Diagnosis Date  . Essential hypertension, benign   . CAD (coronary artery disease)     a. BMS to LAD 2001 at [facility] b. PTCA/atherectomy ramus and BMS to LAD 2009  . Chronic systolic heart failure   . GERD (gastroesophageal reflux disease)   . Paroxysmal atrial fibrillation     a. on amiodarone, digoxin and Eliquis  . Adenocarcinoma of rectum     a. 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 Prostate cancer     a. s/p seed implants with chemo and radiation  . Biventricular ICD (implantable cardioverter-defibrillator) in place 2009    upgrade  . TIA (transient ischemic attack)   . OSA on CPAP   . HLD (hyperlipidemia)   . Orthostatic hypotension   . Dizziness     a. chronic. Admission for this 07/18/2014  . History of falling July 2015    due to dizziness from medications  . Ischemic cardiomyopathy     EF 18% by nuclear study 2016, multiple myocardial infarctions in past    . Chronic kidney disease, stage IV (severe) 12/11/2013  . DM type 2, uncontrolled, with renal complications 7/89/3810  . Obesity 12/12/2013  . Hematuria 03/2015   Past Surgical History  Procedure Laterality Date  . Internal defibrillator and pacemaker  2002  . Abdominal and perineal resection of rectum with total mesorectal excision  10/04/2007  . Colonoscopy  09/14/2011    Dr. Gala Romney: via colostomy, Single pedunculated benign inflammatory polyp. Due for surveillance Oct 2015  . Colostomy  2008  . Colonoscopy N/A 07/02/2014    Procedure: COLONOSCOPY;  Surgeon: Daneil Dolin, MD;  [facility];  Service: Endoscopy;  Laterality: N/A;  7:30 / COLONOSCOPY THRU COLOSTOMY  . Esophagogastroduodenoscopy  N/A 07/02/2014    Procedure: ESOPHAGOGASTRODUODENOSCOPY (EGD);  Surgeon: Daneil Dolin, MD;  Location: [facility];  Service: Endoscopy;  Laterality: N/A;  7:30  . Savory dilation N/A 07/02/2014    Procedure: SAVORY DILATION;  Surgeon: Daneil Dolin, MD;  Location: [facility];  Service: Endoscopy;  Laterality: N/A;  7:30  . Maloney dilation N/A 07/02/2014    Procedure: Venia Minks DILATION;  Surgeon: Daneil Dolin, MD;  Location: [facility];  Service: Endoscopy;  Laterality: N/A;  7:30  . Portacath placement  06/2007    "removed ~ 1 yr later"  . Cardiac catheterization  08/2001  . Coronary angioplasty with stent placement  2001; ~ 2006    "1 + 1"   . Left heart catheterization with coronary angiogram N/A 07/13/2013    Procedure: LEFT HEART CATHETERIZATION WITH CORONARY ANGIOGRAM;  Surgeon: Lorretta Harp, MD;  Location: [facility] CATH LAB;  Service: Cardiovascular;  Laterality: N/A;  . Colonoscopy N/A 12/11/2014    Dr. Gala Romney via colostomy. Normal. Repeat in 2021.   Marland Kitchen Esophagogastroduodenoscopy N/A 12/11/2014    [id number] EGD  . Cardiac defibrillator placement  2009    Upgraded to a BiV ICD  . Right heart catheterization N/A 02/24/2015    Procedure: RIGHT HEART CATH;  Surgeon: Jolaine Artist, MD;  Location: [facility] CATH LAB;  Service: Cardiovascular;  Laterality: N/A;  . Ep implantable device N/A 04/10/2015    Procedure: Ppm/Biv Ppm Generator Changeout;  Surgeon: Evans Lance, MD;  Location: [place] [facility] CV LAB CUPID;  Service: Cardiovascular;  Laterality: N/A;   Social History:  reports that he has never smoked. He has never used smokeless tobacco. He reports that he does not drink alcohol or use illicit drugs.He lives at home with his wife. He ambulates without the need for assistive devices. He is independent in his activities of daily living.   No Known Allergies  Family History  Problem Relation Age of Onset  . Colon cancer Mother 73  . Colon cancer Sister 33  . Coronary artery disease Father   . Colon cancer Other     2 cousins, succumbed to illness  Significant family history of colorectal cancer.   Prior to Admission medications   Medication Sig Start Date End Date Taking? Authorizing Provider  apixaban (ELIQUIS) 5 MG TABS tablet Take 5 mg by mouth 2  (two) times daily.   Yes Historical Provider, MD  aspirin EC 81 MG tablet Take 81 mg by mouth daily.   Yes Historical Provider, MD  co-enzyme Q-10 50 MG capsule Take 50 mg by mouth every morning.    Yes Historical Provider, MD  fluticasone (FLONASE) 50 MCG/ACT nasal spray Place 1 spray into both nostrils daily as needed for allergies.  11/21/13  Yes Historical Provider, MD  furosemide (LASIX) 40 MG tablet Take 1 tablet (40 mg total) by mouth daily. 04/27/15  Yes Luke K Kilroy, PA-C  glimepiride (AMARYL) 1 MG tablet Take 1 mg by mouth daily with breakfast.   Yes Historical Provider, MD  Insulin Glargine (TOUJEO SOLOSTAR [place]) Inject 30 Units into the skin every morning.   Yes Historical Provider, MD  isosorbide dinitrate (ISORDIL) 30 MG tablet Take 15 mg by mouth 2 (two) times daily. 06/24/15  Yes Historical Provider, MD  metoprolol succinate (TOPROL-XL) 25 MG 24 hr tablet Take 25-50 mg by mouth See admin instructions. 06/24/15  Yes Historical Provider, MD  nitroGLYCERIN (NITROLINGUAL) 0.4 MG/SPRAY spray Place 1 spray under the tongue  every 5 (five) minutes as needed. angina 07/19/14  Yes Arnoldo Lenis, MD  pantoprazole (PROTONIX) 40 MG tablet Take 1 tablet (40 mg total) by mouth daily. 05/14/15  Yes Arnoldo Lenis, MD  rosuvastatin (CRESTOR) 40 MG tablet Take 1 tablet (40 mg total) by mouth daily at 6 PM. 04/27/15  Yes Luke K Kilroy, PA-C  isosorbide-hydrALAZINE (BIDIL) 20-37.5 MG per tablet Take 0.5 tablets by mouth 3 (three) times daily. Patient not taking: Reported on 07/21/2015 04/27/15   Erlene Quan, PA-C  metoprolol tartrate (LOPRESSOR) 25 MG tablet Take 1 tablet (25 mg total) by mouth 2 (two) times daily. Patient not taking: Reported on 07/21/2015 05/14/15   Jolaine Artist, MD   Physical Exam: Filed Vitals:   07/21/15 1116 07/21/15 1121 07/21/15 1145 07/21/15 1222  BP:  86/55 92/69 94/64   Pulse: 76 82 80 99  Temp:    97.7 F (36.5 C)  TempSrc:    Oral  Resp: 21 19 14 16   Height:        Weight:      SpO2: 99% 100% 98% 100%    Wt Readings from Last 3 Encounters:  07/21/15 202 lb 4.8 oz (91.763 kg)  07/14/15 198 lb (89.812 kg)  05/14/15 191 lb (86.637 kg)    General:  [name] is a pleasant 59yo male, appears stated age, appears comfortable and calm and in no acute distress.  Eyes: Pupils equal and round, normal lids, irises & conjunctiva ENT: moist mucous membranes Neck: supple, no LAD, masses or thyromegaly Cardiovascular: RRR, possible murmur heard at left sternal border. No lower extremity edema. Radial pulses 2+ bilaterally. Respiratory: Good air movement, lungs are clear to ascultation bilaterally. No wheezes, rhonchi, or rales.  Abdomen: soft, non-distended, non-tender, positive bowel sounds. Colostomy noted in LLQ.  Skin: no rash or induration seen on limited exam Musculoskeletal: Moves all four Psychiatric: grossly normal mood and affect, speech fluent and appropriate Neurologic: grossly non-focal.          Labs on Admission:  Basic Metabolic Panel:  Recent Labs Lab 07/14/15 1300 07/21/15 0947  NA 139 135  K 4.3 4.7  CL 106 100*  CO2 23 23  GLUCOSE 65 91  BUN 53* 55*  CREATININE 3.34* 3.14*  CALCIUM 9.6 9.4   Liver Function Tests:  Recent Labs Lab 07/14/15 1300  AST 29  ALT 20  ALKPHOS 73  BILITOT 1.2  PROT 7.5  ALBUMIN 3.2*   CBC:  Recent Labs Lab 07/21/15 0947  WBC 11.3*  NEUTROABS 8.4*  HGB 7.0*  HCT 20.6*  MCV 95.8  PLT 176   Cardiac Enzymes:  Recent Labs Lab 07/21/15 0947  TROPONINI <0.03    BNP (last 3 results)  Recent Labs  02/23/15 1001 04/23/15 0503 07/21/15 0947  BNP 568.0* 1218.9* 781.6*    ProBNP (last 3 results)  Recent Labs  09/27/14 0523 11/19/14 0745  PROBNP 9164.0* 5233.0*    CBG: No results for input(s): GLUCAP in the last 168 hours.  Radiological Exams on Admission: Dg Chest 2 View  07/21/2015   CLINICAL DATA:  Wheezing beginning last night.  Initial encounter.  EXAM: CHEST   2 VIEW  COMPARISON:  Single view of the chest 04/25/2015. PA and lateral chest 05/19/2014.  FINDINGS: Heart size is mildly enlarged. Pacing device is in place and appears unchanged. Patchy airspace disease is identified in the right upper lobe. The left lung appears clear. No pneumothorax or pleural effusion.  IMPRESSION: Patchy right  upper lobe airspace disease most worrisome for pneumonia. Recommend followup to clearing.   Electronically Signed   By: Inge Rise M.D.   On: 07/21/2015 09:28    EKG: Independently reviewed. Sinus or atrial ectopic tachycardia. Right bundle branch block unchanged from previous EKGs. QTc 658ms. Atrial enlargement.   Assessment/Plan Principal Problem:   Healthcare-associated pneumonia Active Problems:   History of rectal cancer   Ischemic cardiomyopathy-EF 35% 04/24/15 echo   Long term current use of anticoagulant therapy   Paroxysmal atrial fibrillation   Chronic kidney disease, stage IV (severe)   Dizziness   DM type 2, uncontrolled, with renal complications   GERD (gastroesophageal reflux disease)   Orthostatic hypotension   OSA on CPAP   Hyperlipidemia   Constipation   Anemia of chronic disease   1. HCAP - Most likely related to recent four day hospitalization in late May 2016. CXR positive for RUL pneumonia. Mild leukocytosis at 12K.  - Check legionella, strep pneumonia antigen, sputum cultures, gram stain, blood cultures - Continue Fortaz and Vancomycin, pulmonary toilet, monitor   2. Anemia of chronic disease - Most likely related to chronic kidney disease stage IV. Hemoglobin in May 2016 was 9.7.  - Hemoglobin on admission was 7.0, received 1 unit PRBCs in ED. Etiology unclear, possibly related to worsening kidney function +/- slow GI losses.   - Normal EGD and colonoscopy 12/2014.  - Hemoccult negative, recent iron panel 04/2015 showing low iron (15) and saturation ratios (5%), U/A pending  3. Acute on chronic kidney disease, stage IV  -  Review of records indicates baseline creatinine approximately 2.7. On admission creatinine 3.14; however, this seems to be improving from creatinine on 07/14/15 at 3.3. May be related to lasix use..  - U/A pending - Hold lasix and isordil to help minimize fluid loss and low BP in effort to maintain perfusion.  - Check bmet in am  4. Parxoysmal atrial fibrillation - CHADSVASC 5 - Rate controlled with metoprolol - Anti-coagulated with Eliquis - Cardiac telemetry  5. Dizziness and orthostatic hypotension - Patient endorses dizziness and light headedness with positional changes. Could be related to acute anemia with hgb 7 - Received 1 unit PRBCs in ED - Will hold lasix and isordil today.   - Check orthostatics and monitor closely  6. Ischemic heart disease with systolic congestive heart failure, EF 25% - Most recent 2d echo May 2016 showing LVEF 35% with LV dilation and hypokinesis in distal 1/3 of LV. Mild regurgitation in MV and AV. Pulmonary artery pressure 5mmHg.  - Biventricular ICD placed 04/10/15 - EKG today showing no acute ischemic changes, troponin <0.03. BNP 781.  - Patient does not appear to be volume overloaded on admission - no JVP, lungs are clear, no pitting edema noted, hypotensive - Continue metoprolol, ASA, statin, and nitroglycerin. Will hold lasix and isordil today due to soft BPs - Advanced heart team consulted for additional management.  - daily weights, I/O, cardiac telemetry, heart healthy/carb modified diet  7. Type 2 DM, uncontrolled with renal complications - Moderately controlled, hemoglobin A1c in April 2016 was 8.9% - Blood glucose on admission 91.  Dropped to 66 in ER - Will decrease dose of basal insulin to 20 units in am.  Add SSI - S - Check hgb A1c, monitor CBGs, carb modified diet  8. OSA on CPAP - Continue nightly CPAP PRN   9. Hyperlipidemia - Continue rosuvastatin  10. Constipation - Start miralax and colace  11. GERD - Continue  protonix  12. H/o colorectal cancer - s/p colectomy 2008 with colostomy. Colonoscopy 12/2014 normal.   13. H/o prostate cancer - S/p chemo/radiation, last PSA in 5/16 was 0.09.  Consult to advanced heart team for additional input on management with regard to systolic heart failure.   Code Status: Full, discussed with patient DVT Prophylaxis: Eliquis Family Communication: Mother bedside Disposition Plan: admit, LOS estimated 2-3 days  Time spent: 45 minutes  Frederica Kuster, PA-Student Imogene Burn, [place] [facility] Pager [phone]

## 2015-07-21 NOTE — ED Notes (Signed)
BP 86/55, MD aware.  Patient asymptomatic, no mental changes, alert and oriented x4, no apparent distress.

## 2015-07-21 NOTE — Progress Notes (Addendum)
ANTIBIOTIC CONSULT NOTE - INITIAL  Pharmacy Consult for vanc/ceftaz Indication: HCAP  No Known Allergies  Patient Measurements: Height: 5\' 10"  (177.8 cm) Weight: 202 lb 4.8 oz (91.763 kg) IBW/kg (Calculated) : 73   Vital Signs: Temp: 97.9 F (36.6 C) (08/15 0855) Temp Source: Oral (08/15 0855) BP: 90/59 mmHg (08/15 1044) Pulse Rate: 100 (08/15 1044) Intake/Output from previous day:   Intake/Output from this shift:    Labs:  Recent Labs  07/21/15 0947  WBC 11.3*  HGB 7.0*  PLT 176  CREATININE 3.14*   Estimated Creatinine Clearance: 28.5 mL/min (by C-G formula based on Cr of 3.14). No results for input(s): VANCOTROUGH, VANCOPEAK, VANCORANDOM, GENTTROUGH, GENTPEAK, GENTRANDOM, TOBRATROUGH, TOBRAPEAK, TOBRARND, AMIKACINPEAK, AMIKACINTROU, AMIKACIN in the last 72 hours.   Microbiology: No results found for this or any previous visit (from the past 720 hour(s)).  Medical History: Past Medical History  Diagnosis Date  . Essential hypertension, benign   . CAD (coronary artery disease)     a. BMS to LAD 2001 at [facility] b. PTCA/atherectomy ramus and BMS to LAD 2009  . Chronic systolic heart failure   . GERD (gastroesophageal reflux disease)   . Paroxysmal atrial fibrillation     a. on amiodarone, digoxin and Eliquis  . Adenocarcinoma of rectum     a. 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 Prostate cancer     a. s/p seed implants with chemo and radiation  . Biventricular ICD (implantable cardioverter-defibrillator) in place 2009    upgrade  . TIA (transient ischemic attack)   . OSA on CPAP   . HLD (hyperlipidemia)   . Orthostatic hypotension   . Dizziness     a. chronic. Admission for this 07/18/2014  . History of falling July 2015    due to dizziness from medications  . Ischemic cardiomyopathy     EF 18% by nuclear study 2016, multiple myocardial infarctions in past    . Chronic kidney disease, stage IV (severe) 12/11/2013  . DM type 2, uncontrolled, with renal complications 3/34/3568   . Obesity 12/12/2013  . Hematuria 03/2015     Assessment: 70 yom with CC of SOB worsening. Pharmacy consulted to dose vanc/ceftaz for HCAP. Afebrile, wbc elevated 11.3 on admit. No cx yet. CKD history - SCr 3.14 on admit, CrCl~28  8/15 vanc>> 8/15 ceftaz>>   Goal of Therapy:  Vancomycin trough level 15-20 mcg/ml  Plan:  Vanc 2g IV x 1 dose; then Vanc 1g IV q24h Ceftaz 2g IV q24h Monitor clinical progress, renal function, abx plan VT@SS  as indicated  Elicia Lamp, PharmD Clinical Pharmacist Pager [phone] 07/21/2015 10:49 AM

## 2015-07-21 NOTE — ED Provider Notes (Signed)
CSN: [id number]     Arrival date & time 07/21/15  0845 History   First MD Initiated Contact with Patient 07/21/15 [phone]     Chief Complaint  Patient presents with  . Shortness of Breath     (Consider location/radiation/quality/duration/timing/severity/associated sxs/prior Treatment) HPI Comments: 59 year old male with extensive past medical history including systolic CHF with EF 10%, CKD, A fib on eliquis, rectal cancer s/p resection w/ colostomy who presents with shortness of breath. The patient states that one week ago he saw his cardiologist, who discontinued his amiodarone and renaxa because of his CK D. On 8/13, he had a gradual onset of shortness of breath and wheezing. As instructed by his PCP, he took an extra dose of Lasix. His shortness of breath has progressed and is worse with minimal exertion. He has had a constant chest heaviness/tightness for the past few days but no chest pain. He endorses constipation but no vomiting or diarrhea. No change in his weight. No significant lower extremity edema. No fevers, cough/cold symptoms.  Patient is a 59 y.o. male presenting with shortness of breath. The history is provided by the patient.  Shortness of Breath   Past Medical History  Diagnosis Date  . Essential hypertension, benign   . CAD (coronary artery disease)     a. BMS to LAD 2001 at [facility] b. PTCA/atherectomy ramus and BMS to LAD 2009  . Chronic systolic heart failure   . GERD (gastroesophageal reflux disease)   . Paroxysmal atrial fibrillation     a. on amiodarone, digoxin and Eliquis  . Adenocarcinoma of rectum     a. 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 Prostate cancer     a. s/p seed implants with chemo and radiation  . Biventricular ICD (implantable cardioverter-defibrillator) in place 2009    upgrade  . TIA (transient ischemic attack)   . OSA on CPAP   . HLD (hyperlipidemia)   . Orthostatic hypotension   . Dizziness     a. chronic. Admission for this 07/18/2014  . History of falling  July 2015    due to dizziness from medications  . Ischemic cardiomyopathy     EF 18% by nuclear study 2016, multiple myocardial infarctions in past    . Chronic kidney disease, stage IV (severe) 12/11/2013  . DM type 2, uncontrolled, with renal complications 2/72/5366  . Obesity 12/12/2013  . Hematuria 03/2015   Past Surgical History  Procedure Laterality Date  . Internal defibrillator and pacemaker  2002  . Abdominal and perineal resection of rectum with total mesorectal excision  10/04/2007  . Colonoscopy  09/14/2011    Dr. Gala Romney: via colostomy, Single pedunculated benign inflammatory polyp. Due for surveillance Oct 2015  . Colostomy  2008  . Colonoscopy N/A 07/02/2014    Procedure: COLONOSCOPY;  Surgeon: Daneil Dolin, MD;  [facility];  Service: Endoscopy;  Laterality: N/A;  7:30 / COLONOSCOPY THRU COLOSTOMY  . Esophagogastroduodenoscopy N/A 07/02/2014    Procedure: ESOPHAGOGASTRODUODENOSCOPY (EGD);  Surgeon: Daneil Dolin, MD;  Location: [facility];  Service: Endoscopy;  Laterality: N/A;  7:30  . Savory dilation N/A 07/02/2014    Procedure: SAVORY DILATION;  Surgeon: Daneil Dolin, MD;  Location: [facility];  Service: Endoscopy;  Laterality: N/A;  7:30  . Maloney dilation N/A 07/02/2014    Procedure: Venia Minks DILATION;  Surgeon: Daneil Dolin, MD;  Location: [facility];  Service: Endoscopy;  Laterality: N/A;  7:30  . Portacath placement  06/2007    "removed ~  1 yr later"  . Cardiac catheterization  08/2001  . Coronary angioplasty with stent placement  2001; ~ 2006    "1 + 1"   . Left heart catheterization with coronary angiogram N/A 07/13/2013    Procedure: LEFT HEART CATHETERIZATION WITH CORONARY ANGIOGRAM;  Surgeon: Lorretta Harp, MD;  Location: [facility] CATH LAB;  Service: Cardiovascular;  Laterality: N/A;  . Colonoscopy N/A 12/11/2014    Dr. Gala Romney via colostomy. Normal. Repeat in 2021.   Marland Kitchen Esophagogastroduodenoscopy N/A 12/11/2014    GEX:BMWUXL EGD  . Cardiac  defibrillator placement  2009    Upgraded to a BiV ICD  . Right heart catheterization N/A 02/24/2015    Procedure: RIGHT HEART CATH;  Surgeon: Jolaine Artist, MD;  Location: [facility] CATH LAB;  Service: Cardiovascular;  Laterality: N/A;  . Ep implantable device N/A 04/10/2015    Procedure: Ppm/Biv Ppm Generator Changeout;  Surgeon: Evans Lance, MD;  [facility];  Service: Cardiovascular;  Laterality: N/A;   Family History  Problem Relation Age of Onset  . Colon cancer Mother 77  . Colon cancer Sister 32  . Coronary artery disease Father   . Colon cancer Other     2 cousins, succumbed to illness   Social History  Substance Use Topics  . Smoking status: Never Smoker   . Smokeless tobacco: Never Used  . Alcohol Use: No     Comment: Former user 45 years ago    Review of Systems  Respiratory: Positive for shortness of breath.    10 Systems reviewed and are negative for acute change except as noted in the HPI.    Allergies  Review of patient's allergies indicates no known allergies.  Home Medications   Prior to Admission medications   Medication Sig Start Date End Date Taking? Authorizing Provider  apixaban (ELIQUIS) 5 MG TABS tablet Take 5 mg by mouth 2 (two) times daily.   Yes Historical Provider, MD  aspirin EC 81 MG tablet Take 81 mg by mouth daily.   Yes Historical Provider, MD  co-enzyme Q-10 50 MG capsule Take 50 mg by mouth every morning.    Yes Historical Provider, MD  fluticasone (FLONASE) 50 MCG/ACT nasal spray Place 1 spray into both nostrils daily as needed for allergies.  11/21/13  Yes Historical Provider, MD  furosemide (LASIX) 40 MG tablet Take 1 tablet (40 mg total) by mouth daily. 04/27/15  Yes Luke K Kilroy, PA-C  glimepiride (AMARYL) 1 MG tablet Take 1 mg by mouth daily with breakfast.   Yes Historical Provider, MD  Insulin Glargine (TOUJEO SOLOSTAR [place]) Inject 30 Units into the skin every morning.   Yes Historical Provider, MD  isosorbide  dinitrate (ISORDIL) 30 MG tablet Take 15 mg by mouth 2 (two) times daily. 06/24/15  Yes Historical Provider, MD  metoprolol succinate (TOPROL-XL) 25 MG 24 hr tablet Take 25-50 mg by mouth See admin instructions. 06/24/15  Yes Historical Provider, MD  nitroGLYCERIN (NITROLINGUAL) 0.4 MG/SPRAY spray Place 1 spray under the tongue every 5 (five) minutes as needed. angina 07/19/14  Yes Arnoldo Lenis, MD  pantoprazole (PROTONIX) 40 MG tablet Take 1 tablet (40 mg total) by mouth daily. 05/14/15  Yes Arnoldo Lenis, MD  rosuvastatin (CRESTOR) 40 MG tablet Take 1 tablet (40 mg total) by mouth daily at 6 PM. 04/27/15  Yes Doreene Burke Kilroy, PA-C   BP 93/62 mmHg  Pulse 83  Temp(Src) 97.6 F (36.4 C) (Oral)  Resp 12  Ht 5\' 10"  (1.778 m)  Wt 202 lb 4.8 oz (91.763 kg)  BMI 29.03 kg/m2  SpO2 95% Physical Exam  Constitutional: He is oriented to person, place, and time. He appears well-developed and well-nourished. No distress.  HENT:  Head: Normocephalic and atraumatic.  Moist mucous membranes  Eyes: Conjunctivae are normal. Pupils are equal, round, and reactive to light.  Neck: Neck supple.  Cardiovascular: Normal rate and regular rhythm.   3/6 systolic murmur  Pulmonary/Chest: Effort normal and breath sounds normal. No respiratory distress. He has no wheezes.  Good air movement bilaterally  Abdominal: Soft. Bowel sounds are normal. He exhibits no distension. There is no tenderness.  Colostomy in left lower quadrant without any stool in bag  Musculoskeletal: He exhibits no edema.  Neurological: He is alert and oriented to person, place, and time.  Fluent speech  Skin: Skin is warm and dry.  Psychiatric: He has a normal mood and affect. Judgment normal.  Pleasant  Nursing note and vitals reviewed.   ED Course  Procedures (including critical care time) Labs Review Labs Reviewed  BASIC METABOLIC PANEL - Abnormal; Notable for the following:    Chloride 100 (*)    BUN 55 (*)    Creatinine, Ser  3.14 (*)    GFR calc non Af Amer 20 (*)    GFR calc Af Amer 23 (*)    All other components within normal limits  BRAIN NATRIURETIC PEPTIDE - Abnormal; Notable for the following:    B Natriuretic Peptide 781.6 (*)    All other components within normal limits  CBC WITH DIFFERENTIAL/PLATELET - Abnormal; Notable for the following:    WBC 11.3 (*)    RBC 2.15 (*)    Hemoglobin 7.0 (*)    HCT 20.6 (*)    RDW 16.6 (*)    Neutro Abs 8.4 (*)    Monocytes Absolute 1.2 (*)    All other components within normal limits  CBG MONITORING, ED - Abnormal; Notable for the following:    Glucose-Capillary 117 (*)    All other components within normal limits  CULTURE, EXPECTORATED SPUTUM-ASSESSMENT  GRAM STAIN  CULTURE, BLOOD (ROUTINE X 2)  CULTURE, BLOOD (ROUTINE X 2)  TROPONIN I  HIV ANTIBODY (ROUTINE TESTING)  LEGIONELLA ANTIGEN, URINE  STREP PNEUMONIAE URINARY ANTIGEN  POC OCCULT BLOOD, ED  CBG MONITORING, ED  PREPARE RBC (CROSSMATCH)  TYPE AND SCREEN    Imaging Review Dg Chest 2 View  07/21/2015   CLINICAL DATA:  Wheezing beginning last night.  Initial encounter.  EXAM: CHEST  2 VIEW  COMPARISON:  Single view of the chest 04/25/2015. PA and lateral chest 05/19/2014.  FINDINGS: Heart size is mildly enlarged. Pacing device is in place and appears unchanged. Patchy airspace disease is identified in the right upper lobe. The left lung appears clear. No pneumothorax or pleural effusion.  IMPRESSION: Patchy right upper lobe airspace disease most worrisome for pneumonia. Recommend followup to clearing.   Electronically Signed   By: Inge Rise M.D.   On: 07/21/2015 09:28   I, Freeman Spur, personally reviewed and evaluated these images and lab results as part of my medical decision-making.   EKG Interpretation   Date/Time:  Monday July 21 2015 10:39:03 EDT Ventricular Rate:  101 PR Interval:  95 QRS Duration: 208 QT Interval:  472 QTC Calculation: 612 R Axis:   -97 Text  Interpretation:  Sinus or ectopic atrial tachycardia Left atrial  enlargement Right bundle branch block Confirmed by Jazmine Longshore  MD, Apolonio Schneiders  [phone]) on 07/21/2015 11:02:45 AM      MDM   Final diagnoses:  Anemia, unspecified anemia type  Shortness of breath  CHF exacerbation   59 year old male with extensive cardiac history who presents with several days of gradually worsening shortness of breath worse with exertion. At presentation, the patient was awake, alert, well appearing and in no acute distress. Vital signs unremarkable. No obvious signs of volume overload on exam and normal respiratory sounds. EKG on arrival shows ventricularly paced rhythm without evidence of acute ischemia. Obtained labs listed above to evaluate for evidence of infection or worsening heart failure.  The patient is compliant with Eliquis and given the gradual onset of his symptoms without any vital sign abnormalities, I feel that PE is less likely. No sudden onset of chest pain or ripping/tearing chest pain to suggest aortic dissection. Labs notable for hemoglobin 7 and hematocrit 20.6, which is significantly different from the patient's last lab work. The patient denies any tarry stools or blood in his ostomy bag. He thus far he has been unable to provide a sample for Hemoccult testing. I have sent type and screen and consented patient for blood. Ordered 1 unit packed red blood cells for transfusion. I suspect that some of his shortness of breath symptoms and lightheadedness are due to symptomatic anemia. His BNP is also elevated at 781; troponin negative. Creatinine remains elevated at 3.14. I suspect that heart failure exacerbation may also be contributing to his symptoms. Given these multiple problems, the patient will be admitted for further workup of his anemia and treatment of heart failure.  CRITICAL CARE Performed by: Wenda Overland Jarrah Seher   Total critical care time: 30 minutes  Critical care time was exclusive of  separately billable procedures and treating other patients.  Critical care was necessary to treat or prevent imminent or life-threatening deterioration.  Critical care was time spent personally by me on the following activities: development of treatment plan with patient and/or surrogate as well as nursing, discussions with consultants, evaluation of patient's response to treatment, examination of patient, obtaining history from patient or surrogate, ordering and performing treatments and interventions, ordering and review of laboratory studies, ordering and review of radiographic studies, pulse oximetry and re-evaluation of patient's condition.   Sharlett Iles, MD 07/21/15 [phone]

## 2015-07-21 NOTE — ED Notes (Signed)
Attempted report x 2 

## 2015-07-21 NOTE — ED Notes (Signed)
Attempted report x1. 

## 2015-07-21 NOTE — ED Notes (Signed)
No need for blood cultures prior to antibiotics per Dr. Rex Kras. Pt instructed to notify RN when he had output in his colostomy so that we could collect hemoccult.

## 2015-07-22 DIAGNOSIS — K5909 Other constipation: Secondary | ICD-10-CM

## 2015-07-22 DIAGNOSIS — I482 Chronic atrial fibrillation: Secondary | ICD-10-CM

## 2015-07-22 DIAGNOSIS — I255 Ischemic cardiomyopathy: Secondary | ICD-10-CM

## 2015-07-22 DIAGNOSIS — Z85048 Personal history of other malignant neoplasm of rectum, rectosigmoid junction, and anus: Secondary | ICD-10-CM

## 2015-07-22 DIAGNOSIS — I5022 Chronic systolic (congestive) heart failure: Secondary | ICD-10-CM

## 2015-07-22 DIAGNOSIS — G4733 Obstructive sleep apnea (adult) (pediatric): Secondary | ICD-10-CM

## 2015-07-22 DIAGNOSIS — E785 Hyperlipidemia, unspecified: Secondary | ICD-10-CM

## 2015-07-22 DIAGNOSIS — K219 Gastro-esophageal reflux disease without esophagitis: Secondary | ICD-10-CM

## 2015-07-22 DIAGNOSIS — D509 Iron deficiency anemia, unspecified: Secondary | ICD-10-CM

## 2015-07-22 LAB — LEGIONELLA ANTIGEN, URINE

## 2015-07-22 LAB — BASIC METABOLIC PANEL
ANION GAP: 13 (ref 5–15)
BUN: 51 mg/dL — ABNORMAL HIGH (ref 6–20)
CHLORIDE: 100 mmol/L — AB (ref 101–111)
CO2: 22 mmol/L (ref 22–32)
Calcium: 9 mg/dL (ref 8.9–10.3)
Creatinine, Ser: 3.06 mg/dL — ABNORMAL HIGH (ref 0.61–1.24)
GFR, EST AFRICAN AMERICAN: 24 mL/min — AB (ref >60)
GFR, EST NON AFRICAN AMERICAN: 21 mL/min — AB (ref >60)
Glucose, Bld: 73 mg/dL (ref 65–99)
POTASSIUM: 3.6 mmol/L (ref 3.5–5.1)
SODIUM: 135 mmol/L (ref 135–145)

## 2015-07-22 LAB — GLUCOSE, CAPILLARY
GLUCOSE-CAPILLARY: 67 mg/dL (ref 65–99)
GLUCOSE-CAPILLARY: 78 mg/dL (ref 65–99)
Glucose-Capillary: 120 mg/dL — ABNORMAL HIGH (ref 65–99)
Glucose-Capillary: 133 mg/dL — ABNORMAL HIGH (ref 65–99)
Glucose-Capillary: 94 mg/dL (ref 65–99)

## 2015-07-22 LAB — HIV ANTIBODY (ROUTINE TESTING W REFLEX): HIV Screen 4th Generation wRfx: NONREACTIVE

## 2015-07-22 LAB — CBC
HCT: 33.3 % — ABNORMAL LOW (ref 39.0–52.0)
HEMOGLOBIN: 11 g/dL — AB (ref 13.0–17.0)
MCH: 31.7 pg (ref 26.0–34.0)
MCHC: 33 g/dL (ref 30.0–36.0)
MCV: 96 fL (ref 78.0–100.0)
PLATELETS: 143 10*3/uL — AB (ref 150–400)
RBC: 3.47 MIL/uL — AB (ref 4.22–5.81)
RDW: 16.9 % — ABNORMAL HIGH (ref 11.5–15.5)
WBC: 7.9 10*3/uL (ref 4.0–10.5)

## 2015-07-22 LAB — MAGNESIUM: MAGNESIUM: 2.4 mg/dL (ref 1.7–2.4)

## 2015-07-22 MED ORDER — FUROSEMIDE 10 MG/ML IJ SOLN
40.0000 mg | Freq: Once | INTRAMUSCULAR | Status: AC
  Administered 2015-07-22: 40 mg via INTRAVENOUS
  Filled 2015-07-22: qty 4

## 2015-07-22 MED ORDER — ISOSORB DINITRATE-HYDRALAZINE 20-37.5 MG PO TABS
0.5000 | ORAL_TABLET | Freq: Three times a day (TID) | ORAL | Status: DC
  Administered 2015-07-22 – 2015-07-23 (×3): 0.5 via ORAL
  Filled 2015-07-22: qty 1
  Filled 2015-07-22: qty 0.5
  Filled 2015-07-22 (×2): qty 1
  Filled 2015-07-22: qty 0.5
  Filled 2015-07-22: qty 1
  Filled 2015-07-22 (×4): qty 0.5

## 2015-07-22 MED ORDER — ZOLPIDEM TARTRATE 5 MG PO TABS
5.0000 mg | ORAL_TABLET | Freq: Once | ORAL | Status: AC
  Administered 2015-07-22: 5 mg via ORAL
  Filled 2015-07-22: qty 1

## 2015-07-22 MED ORDER — METOPROLOL SUCCINATE ER 25 MG PO TB24
25.0000 mg | ORAL_TABLET | Freq: Every day | ORAL | Status: DC
  Administered 2015-07-22: 25 mg via ORAL
  Filled 2015-07-22 (×3): qty 1

## 2015-07-22 MED ORDER — FLUTICASONE PROPIONATE 50 MCG/ACT NA SUSP: 1.0000 | NASAL | Status: DC | PRN

## 2015-07-22 MED ORDER — METOPROLOL SUCCINATE ER 50 MG PO TB24
50.0000 mg | ORAL_TABLET | Freq: Every day | ORAL | Status: DC
  Administered 2015-07-22: 50 mg via ORAL
  Filled 2015-07-22 (×3): qty 1

## 2015-07-22 NOTE — Progress Notes (Signed)
Inpatient Diabetes Program Recommendations  AACE/ADA: New Consensus Statement on Inpatient Glycemic Control (2013)  Target Ranges:  Prepandial:   less than 140 mg/dL      Peak postprandial:   less than 180 mg/dL (1-2 hours)      Critically ill patients:  140 - 180 mg/dL   Review of glycemic control: Results for [name], [name] (MRN [id number]) as of 07/22/2015 10:52  Ref. Range 07/21/2015 15:24 07/21/2015 17:29 07/21/2015 21:55 07/22/2015 06:04 07/22/2015 06:16  Glucose-Capillary Latest Ref Range: 65-99 mg/dL 117 (H) 92 111 (H) 67 78   Diabetes history:  Outpatient Diabetes medications: Toujeo 30 units daily, Amaryl 1 mg daily,  Current orders for Inpatient glycemic control:  Lantus 20 units daily, Novolog sensitive tid with  Meals  Note that patient refused Lantus this AM.  Consider holding Lantus until CBG's greater than 150 mg/dL.    Thanks, Adah Perl, RN, BC-ADM Inpatient Diabetes Coordinator Pager [phone] (8a-5p)

## 2015-07-22 NOTE — Progress Notes (Signed)
Utilization review completed. Tishie Altmann, RN, BSN. 

## 2015-07-22 NOTE — Evaluation (Signed)
Physical Therapy Evaluation and D/C Patient Details Name: [name] MRN: [id number] DOB: 29-Jun-1956 Today's Date: 07/22/2015   History of Present Illness  [name] is a 59 y.o. male with Biventricular ICD, coronary artery disease, paroxysmal atrial fibrillation on Eliquis, type 2 diabetes, chronic kidney disease stage IV, ischemic cardiomyopathy with systolic heart failure May 2016 2d echo showing LVEF 30%, history of rectal cancer s/p colostomy, and history of prostate cancer s/p chemo/radiation presenting, presenting with progressive shortness of breath and wheezing. He initially noticed the shortness of breath and wheezing on 07/19/15, which he states is his usual presentation of a CHF exacerbation. Per his cardiologist, he took an extra dose of Lasix, but the symptoms did not improve prompting him to come to the ED. Positive for PNA.    Clinical Impression  Pt admitted with above diagnosis. Pt currently without significant functional limitations with pt ambulating independently without LOB with challenges.  Pt does not  Need PT at this time.  Will sign off.    Follow Up Recommendations No PT follow up    Equipment Recommendations  None recommended by PT    Recommendations for Other Services       Precautions / Restrictions Precautions Precautions: Fall Restrictions Weight Bearing Restrictions: No      Mobility  Bed Mobility Overal bed mobility: Independent                Transfers Overall transfer level: Independent                  Ambulation/Gait Ambulation/Gait assistance: Modified independent (Device/Increase time) Ambulation Distance (Feet): 300 Feet Assistive device: None Gait Pattern/deviations: Step-through pattern;Decreased stride length;Narrow base of support;Drifts right/left   Gait velocity interpretation: <1.8 ft/sec, indicative of risk for recurrent falls General Gait Details: No gross LOB with challenges.  Pt at times drifts but was  not unsteady during ambulattion.    Stairs            Wheelchair Mobility    Modified Rankin (Stroke Patients Only)       Balance Overall balance assessment: Needs assistance Sitting-balance support: No upper extremity supported;Feet supported Sitting balance-Leahy Scale: Good     Standing balance support: No upper extremity supported;During functional activity Standing balance-Leahy Scale: Fair Standing balance comment: Pt with good static standing balance and does not need UE support.                   Standardized Balance Assessment Standardized Balance Assessment : Dynamic Gait Index   Dynamic Gait Index Level Surface: Normal Change in Gait Speed: Normal Gait with Horizontal Head Turns: Mild Impairment Gait with Vertical Head Turns: Normal Gait and Pivot Turn: Normal Step Over Obstacle: Normal Step Around Obstacles: Normal Steps: Mild Impairment Total Score: 22       Pertinent Vitals/Pain Pain Assessment: No/denies pain  VSS    Home Living Family/patient expects to be discharged to:: Private residence Living Arrangements: Spouse/significant other Available Help at Discharge: Family;Available 24 hours/day Type of Home: Mobile home Home Access: Stairs to enter Entrance Stairs-Rails: Can reach both;Left;Right Entrance Stairs-Number of Steps: 2 Home Layout: One level Home Equipment: Walker - 2 wheels      Prior Function Level of Independence: Independent         Comments: He and wife are retired.     Hand Dominance        Extremity/Trunk Assessment   Upper Extremity Assessment: Defer to OT evaluation  Lower Extremity Assessment: Overall WFL for tasks assessed      Cervical / Trunk Assessment: Normal  Communication   Communication: No difficulties  Cognition Arousal/Alertness: Awake/alert Behavior During Therapy: WFL for tasks assessed/performed Overall Cognitive Status: Within Functional Limits for tasks  assessed                      General Comments General comments (skin integrity, edema, etc.): Scored 22/24 on DGI suggesting low risk for falls.      Exercises        Assessment/Plan    PT Assessment Patent does not need any further PT services  PT Diagnosis Generalized weakness   PT Problem List    PT Treatment Interventions     PT Goals (Current goals can be found in the Care Plan section) Acute Rehab PT Goals PT Goal Formulation: All assessment and education complete, DC therapy    Frequency     Barriers to discharge        Co-evaluation               End of Session Equipment Utilized During Treatment: Gait belt Activity Tolerance: Patient tolerated treatment well Patient left: in bed;with call bell/phone within reach Nurse Communication: Mobility status         Time: 0910-0921 PT Time Calculation (min) (ACUTE ONLY): 11 min   Charges:   PT Evaluation $Initial PT Evaluation Tier I: 1 Procedure     PT G CodesDenice Paradise 08-03-15, 9:33 AM  [facility] [phone] (pager)

## 2015-07-22 NOTE — Evaluation (Signed)
Occupational Therapy Evaluation Patient Details Name: [name] MRN: [id number] DOB: June 10, 1956 Today's Date: 07/22/2015    History of Present Illness [name] is a 59 y.o. male with Biventricular ICD, coronary artery disease, paroxysmal atrial fibrillation on Eliquis, type 2 diabetes, chronic kidney disease stage IV, ischemic cardiomyopathy with systolic heart failure May 2016 2d echo showing LVEF 30%, history of rectal cancer s/p colostomy, and history of prostate cancer s/p chemo/radiation presenting, presenting with progressive shortness of breath and wheezing. He initially noticed the shortness of breath and wheezing on 07/19/15, which he states is his usual presentation of a CHF exacerbation. Per his cardiologist, he took an extra dose of Lasix, but the symptoms did not improve prompting him to come to the ED. Positive for PNA.     Clinical Impression   Pt is independent in self care and mobility. No further OT needs.    Follow Up Recommendations  No OT follow up    Equipment Recommendations  None recommended by OT    Recommendations for Other Services       Precautions / Restrictions Precautions Precautions: Fall Restrictions Weight Bearing Restrictions: No      Mobility Bed Mobility Overal bed mobility: Independent                Transfers Overall transfer level: Independent                    Balance Overall balance assessment: Needs assistance Sitting-balance support: No upper extremity supported;Feet supported Sitting balance-Leahy Scale: Good     Standing balance support: No upper extremity supported;During functional activity Standing balance-Leahy Scale: Fair Standing balance comment: Pt with good static standing balance and does not need UE support.                         ADL Overall ADL's : Independent;At baseline                                       General ADL Comments: Pt educated in energy  conservation and cautioned to avoid outside activites in the excessive heat. Pt does not think a shower seat is necessary.     Vision     Perception     Praxis      Pertinent Vitals/Pain Pain Assessment: No/denies pain     Hand Dominance Right   Extremity/Trunk Assessment Upper Extremity Assessment Upper Extremity Assessment: Overall WFL for tasks assessed   Lower Extremity Assessment Lower Extremity Assessment: Overall WFL for tasks assessed   Cervical / Trunk Assessment Cervical / Trunk Assessment: Normal   Communication Communication Communication: No difficulties   Cognition Arousal/Alertness: Awake/alert Behavior During Therapy: WFL for tasks assessed/performed Overall Cognitive Status: Within Functional Limits for tasks assessed                     General Comments       Exercises       Shoulder Instructions      Home Living Family/patient expects to be discharged to:: Private residence Living Arrangements: Spouse/significant other Available Help at Discharge: Family;Available 24 hours/day Type of Home: Mobile home Home Access: Stairs to enter Entrance Stairs-Number of Steps: 2 Entrance Stairs-Rails: Can reach both;Left;Right Home Layout: One level     Bathroom Shower/Tub: Occupational psychologist: Standard (pt has a colostomy)  Home Equipment: [employer] - 2 wheels          Prior Functioning/Environment Level of Independence: Independent        Comments: Pt is retired, drives, mows with a Chief of Staff.    OT Diagnosis:     OT Problem List:     OT Treatment/Interventions:      OT Goals(Current goals can be found in the care plan section)    OT Frequency:     Barriers to D/C:            Co-evaluation              End of Session Nurse Communication: Mobility status  Activity Tolerance: Patient tolerated treatment well Patient left: in bed;with call bell/phone within reach;with nursing/sitter in room    Time: 1111-1131 OT Time Calculation (min): 20 min Charges:  OT General Charges $OT Visit: 1 Procedure OT Evaluation $Initial OT Evaluation Tier I: 1 Procedure G-Codes:    [name] 07/22/2015, 11:42 AM  [phone]

## 2015-07-22 NOTE — Progress Notes (Signed)
Hypoglycemic Event  CBG: 67  Treatment: 15 GM carbohydrate snack  Symptoms: None  Follow-up CBG: Time: 0616 CBG Result: 78  Possible Reasons for Event: Unknown  Comments/MD notified: Schorr    [name]  Remember to initiate Hypoglycemia Order Set & complete

## 2015-07-22 NOTE — Progress Notes (Signed)
Pt has some episodes of VTach, Schorr made aware and ordered Magnessium lab. No complaints made. We will continue to monitor.

## 2015-07-22 NOTE — Progress Notes (Signed)
Patient states he will put on his CPAP when he goes to bed. No assistance needed. RT will continue to monitor as needed.

## 2015-07-22 NOTE — Consult Note (Signed)
Referring Provider: Dr. Dyann Kief Primary Care Physician:  Glo Herring., MD Primary Gastroenterologist:  Althia Forts  Reason for Consultation:  Anemia  HPI: [name] is a 59 y.o. male being seen for anemia with a Hgb 7 on admit without any overt bleeding. Hgb 11 after one unit of PRBCs. Has been on Aspirin and Eliquis for Afib. History of colon cancer (2008) and s/p APR with a colostomy and last had a colonoscopy in Jan 2016 that was normal. EGD in Jan 2016 which was done for reflux was normal as well (Dr. Gala Romney in [place]). History of GERD. Denies dysphagia, abdominal pain, melena, hematochezia. Heme negative. Denies having a capsule endoscopy. On Abx for pneumonia. Being diuresed with IV Lasix.    Past Medical History  Diagnosis Date  . Essential hypertension, benign   . CAD (coronary artery disease)     a. BMS to LAD 2001 at [facility] b. PTCA/atherectomy ramus and BMS to LAD 2009  . Chronic systolic heart failure   . GERD (gastroesophageal reflux disease)   . Paroxysmal atrial fibrillation     a. on amiodarone, digoxin and Eliquis  . Adenocarcinoma of rectum     a. 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 Prostate cancer     a. s/p seed implants with chemo and radiation  . TIA (transient ischemic attack)   . OSA on CPAP   . HLD (hyperlipidemia)   . Orthostatic hypotension   . Dizziness     a. chronic. Admission for this 07/18/2014  . History of falling July 2015    due to dizziness from medications  . Ischemic cardiomyopathy     EF 18% by nuclear study 2016, multiple myocardial infarctions in past    . Chronic kidney disease, stage IV (severe) 12/11/2013  . DM type 2, uncontrolled, with renal complications 0/24/0973  . Obesity 12/12/2013  . Hematuria 03/2015  . HCAP (healthcare-associated pneumonia) 07/21/2015  . AICD (automatic cardioverter/defibrillator) present 2009    BiVentricular (upgrade)  . 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 History of blood transfusion      "I've had 2 units so far this year" (07/21/2015)  . Anemia     Past Surgical History  Procedure Laterality Date  . Internal defibrillator and pacemaker  2002  . Abdominal and perineal resection of rectum with total mesorectal excision  10/04/2007  . Colonoscopy  09/14/2011    Dr. Gala Romney: via colostomy, Single pedunculated benign inflammatory polyp. Due for surveillance Oct 2015  . Colostomy  2008  . Colonoscopy N/A 07/02/2014    Procedure: COLONOSCOPY;  Surgeon: Daneil Dolin, MD;  Location: [facility];  Service: Endoscopy;  Laterality: N/A;  7:30 / COLONOSCOPY THRU COLOSTOMY  . Esophagogastroduodenoscopy N/A 07/02/2014    Procedure: ESOPHAGOGASTRODUODENOSCOPY (EGD);  Surgeon: Daneil Dolin, MD;  Location: [facility];  Service: Endoscopy;  Laterality: N/A;  7:30  . Savory dilation N/A 07/02/2014    Procedure: SAVORY DILATION;  Surgeon: Daneil Dolin, MD;  Location: [facility];  Service: Endoscopy;  Laterality: N/A;  7:30  . Maloney dilation N/A 07/02/2014    Procedure: Venia Minks DILATION;  Surgeon: Daneil Dolin, MD;  Location: [facility];  Service: Endoscopy;  Laterality: N/A;  7:30  . Portacath placement  06/2007    "removed ~ 1 yr later"  . Left heart catheterization with coronary angiogram N/A 07/13/2013    Procedure: LEFT HEART CATHETERIZATION WITH CORONARY ANGIOGRAM;  Surgeon: Lorretta Harp, MD;  Location: [facility]  CATH LAB;  Service: Cardiovascular;  Laterality: N/A;  . Colonoscopy N/A 12/11/2014    Dr. Gala Romney via colostomy. Normal. Repeat in 2021.   Marland Kitchen Esophagogastroduodenoscopy N/A 12/11/2014    NLG:[id number] EGD  . Cardiac defibrillator placement  2009    Upgraded to a BiV ICD  . Right heart catheterization N/A 02/24/2015    Procedure: RIGHT HEART CATH;  Surgeon: Jolaine Artist, MD;  Location: [facility] CATH LAB;  Service: Cardiovascular;  Laterality: N/A;  . Ep implantable device N/A 04/10/2015    Procedure: Ppm/Biv Ppm Generator Changeout;  Surgeon: Evans Lance, MD;  Location: [place]  INVASIVE CV LAB CUPID;  Service: Cardiovascular;  Laterality: N/A;  . Bi-ventricular implantable cardioverter defibrillator  (crt-d)  2009  . Cardiac catheterization  08/2001; 2009    ; /notes 07/10/2013  . Coronary angioplasty with stent placement  2001; ~ 2006    "1 + 1"     Prior to Admission medications   Medication Sig Start Date End Date Taking? Authorizing Provider  apixaban (ELIQUIS) 5 MG TABS tablet Take 5 mg by mouth 2 (two) times daily.   Yes Historical Provider, MD  aspirin EC 81 MG tablet Take 81 mg by mouth daily.   Yes Historical Provider, MD  co-enzyme Q-10 50 MG capsule Take 50 mg by mouth every morning.    Yes Historical Provider, MD  fluticasone (FLONASE) 50 MCG/ACT nasal spray Place 1 spray into both nostrils daily as needed for allergies.  11/21/13  Yes Historical Provider, MD  furosemide (LASIX) 40 MG tablet Take 1 tablet (40 mg total) by mouth daily. 04/27/15  Yes Luke K Kilroy, PA-C  glimepiride (AMARYL) 1 MG tablet Take 1 mg by mouth daily with breakfast.   Yes Historical Provider, MD  Insulin Glargine (TOUJEO SOLOSTAR [place]) Inject 30 Units into the skin every morning.   Yes Historical Provider, MD  isosorbide dinitrate (ISORDIL) 30 MG tablet Take 15 mg by mouth 2 (two) times daily. 06/24/15  Yes Historical Provider, MD  metoprolol succinate (TOPROL-XL) 25 MG 24 hr tablet Take 25-50 mg by mouth See admin instructions. 06/24/15  Yes Historical Provider, MD  nitroGLYCERIN (NITROLINGUAL) 0.4 MG/SPRAY spray Place 1 spray under the tongue every 5 (five) minutes as needed. angina 07/19/14  Yes Arnoldo Lenis, MD  pantoprazole (PROTONIX) 40 MG tablet Take 1 tablet (40 mg total) by mouth daily. 05/14/15  Yes Arnoldo Lenis, MD  rosuvastatin (CRESTOR) 40 MG tablet Take 1 tablet (40 mg total) by mouth daily at 6 PM. 04/27/15  Yes Erlene Quan, PA-C    Scheduled Meds: . aspirin EC  81 mg Oral Daily  . cefTAZidime (FORTAZ)  IV  2 g Intravenous Q24H  . docusate sodium  100 mg Oral  BID  . insulin aspart  0-9 Units Subcutaneous TID WC  . insulin glargine  20 Units Subcutaneous Daily  . isosorbide-hydrALAZINE  0.5 tablet Oral TID  . metoprolol succinate  25 mg Oral QHS  . metoprolol succinate  50 mg Oral Daily  . pantoprazole  40 mg Oral Daily  . rosuvastatin  40 mg Oral q1800  . sodium chloride  3 mL Intravenous Q12H  . sodium chloride  3 mL Intravenous Q12H  . vancomycin  1,000 mg Intravenous Q24H   Continuous Infusions:  PRN Meds:.sodium chloride, acetaminophen **OR** acetaminophen, fluticasone, nitroGLYCERIN, polyethylene glycol, sodium chloride  Allergies as of 07/21/2015  . (No Known Allergies)    Family History  Problem Relation Age of Onset  .  Colon cancer Mother 33  . Colon cancer Sister 53  . Coronary artery disease Father   . Colon cancer Other     2 cousins, succumbed to illness    Social History   Social History  . Marital Status: Married    Spouse Name: N/A  . Number of Children: 1  . Years of Education: N/A   Occupational History  .     Social History Main Topics  . Smoking status: Never Smoker   . Smokeless tobacco: Never Used  . Alcohol Use: No     Comment: Former user 45 years ago  . Drug Use: No  . Sexual Activity: No   Other Topics Concern  . Not on file   Social History Narrative   Married 8 years   1 son, 2 grandkids    Denies alcohol, drugs, tobacco     Review of Systems: All negative except as stated above in HPI.  Physical Exam: Vital signs: Filed Vitals:   07/22/15 1330  BP: 86/60  Pulse: 105  Temp: 97.4 F (36.3 C)  Resp: 18   Last BM Date: 07/22/15 General:   Lethargic, Well-developed, well-nourished, pleasant and cooperative in NAD Head: atraumatic Eyes: anicteric ENT: oropharynx clear Lungs:  Clear throughout to auscultation.   No wheezes, crackles, or rhonchi. No acute distress. Heart:  Regular rate and rhythm; no murmurs, clicks, rubs,  or gallops. Abdomen: soft, nontender, nondistended,  +BS  Rectal:  Deferred Ext: no edema Skin: no rash, no jaundice  GI:  Lab Results:  Recent Labs  07/21/15 0947 07/22/15 0337  WBC 11.3* 7.9  HGB 7.0* 11.0*  HCT 20.6* 33.3*  PLT 176 143*   BMET  Recent Labs  07/21/15 0947 07/22/15 0337  NA 135 135  K 4.7 3.6  CL 100* 100*  CO2 23 22  GLUCOSE 91 73  BUN 55* 51*  CREATININE 3.14* 3.06*  CALCIUM 9.4 9.0   LFT No results for input(s): PROT, ALBUMIN, AST, ALT, ALKPHOS, BILITOT, BILIDIR, IBILI in the last 72 hours. PT/INR No results for input(s): LABPROT, INR in the last 72 hours.   Studies/Results: Dg Chest 2 View  07/21/2015   CLINICAL DATA:  Wheezing beginning last night.  Initial encounter.  EXAM: CHEST  2 VIEW  COMPARISON:  Single view of the chest 04/25/2015. PA and lateral chest 05/19/2014.  FINDINGS: Heart size is mildly enlarged. Pacing device is in place and appears unchanged. Patchy airspace disease is identified in the right upper lobe. The left lung appears clear. No pneumothorax or pleural effusion.  IMPRESSION: Patchy right upper lobe airspace disease most worrisome for pneumonia. Recommend followup to clearing.   Electronically Signed   By: Inge Rise M.D.   On: 07/21/2015 09:28    Impression/Plan: 59 yo with anemia without any overt bleeding in the setting of Eliquis/Aspirin. Capsule endoscopy needed to evaluate for a small bowel source of the anemia. Low yield to repeat colonoscopy/EGD at this time. Capsule endoscopy tomorrow morning. Hold Eliquis today and tomorrow morning and can resume tomorrow night unless Hgb dropping. Should have results from capsule endoscopy Thursday 07/24/15. Clear liquids this PM. NPO p MN. Standard post-capsule endoscopy results to be given by endoscopy nurse. Will follow up after capsule endoscopy has been completed.    LOS: 1 day   [place] C.  07/22/2015, 2:57 PM  Pager [phone]  If no answer or after 5 PM call [phone]

## 2015-07-22 NOTE — Progress Notes (Signed)
[facility] PROGRESS NOTE Interim History: [name] is a 59 year old with a history of CAD and ischemic cardiomyopathy, 2001 BMS to LAD, PTCH ramus BMS LAD 2009, [employer] BiV ICD generator change 04/2015 , paroxysmal atrial fibrillation, rectal ca-colostomy, OSA, CKD, and traumatic SAH in 1/16 after a fall. In the past he has been followed at the heart failure clinic at [facility] by Dr Carolynn Serve.Presented to [facility] with increased cough, SOB, and dizziness. Blood cultures obtained. CXR concerning for RUL pneumonia. Started on fortaz and vanc. No BRBPR. No black stools in pouch. Hgb 7.0 --given 1UPRBCs. GI on board and heart failure team also consulted.   Filed Weights   07/21/15 0855 07/21/15 2013 07/22/15 0619  Weight: 91.763 kg (202 lb 4.8 oz) 90.901 kg (200 lb 6.4 oz) 90.266 kg (199 lb)        Intake/Output Summary (Last 24 hours) at 07/22/15 2046 Last data filed at 07/22/15 1753  Gross per 24 hour  Intake   1680 ml  Output   1925 ml  Net   -245 ml     Assessment/Plan: 1-  Healthcare-associated pneumonia:  -patient breathing better -no fever and with WBC's back to WNL -will continue vanc and fortaz -continue flutter valve  2-acute on chronic systolic heart failure -heart failure team on board, will follow rec's regarding diuretics and medication adjustments -chronically with soft BP  3-acute on chronic anemia: no overt bleeding appreciated; but patient in need of eliquis and ASA -GI consulted and planning capsule endoscopy -patient has received 1 unit of PRBC -Hgb 11.0 after transfusion; will follow trend  4-Acute-on-chronic kidney injury: stage 4 at baseline -back to baseline after holding diuretics on admission  -will monitor trend  5-Constipation: continue PRN laxative -fecal content appreciated in colostomy bag -patient denies abd pain   6-Hyperlipidemia: continue crestor  7-OSA on CPAP: will continue CPAP QHS  8-GERD (gastroesophageal reflux disease):  continue PPI  9-DM type 2, uncontrolled, with renal complications: continue lantus and SSI -given enforced low carb diet in house, will decrease home dose of lantus to avoid hypoglycemia and adjust as needed base on CBG's fluctuation  10-Paroxysmal atrial fibrillation: rate controlled currently -will follow cardiology rec's adjusting meds -holding eliquis tonight and tomorrow morning for capsule endoscopy as recommended by GI  11-History of rectal cancer - s/p colectomy 2008 with colostomy. Colonoscopy 12/2014 normal.    Ischemic cardiomyopathy-EF 35% 04/24/15 echo  12- H/o prostate cancer - S/p chemo/radiation, last PSA in 5/16 was 0.09. -continue outpatient follow up    Code Status: Full Family Communication: no family at bedside  Disposition Plan: home when medically stable   Consultants:  Heart failure team  GI service (Dr. Michail Sermon)  Procedures: ECHO: last echo 04/24/15 - Left ventricle: POor acoustic windows limit study even with Definity use . LVEF is approximately is apprxoiamtely 35% with hypokinesis/akinesis of distal 1/3 of LV. The cavity size was severely dilated. Systolic function was normal. Wall motion was normal; there were no regional wall motion abnormalities. - Aortic valve: There was trivial regurgitation. - Mitral valve: There was mild regurgitation. - Left atrium: The atrium was severely dilated. - Right ventricle: Systolic function was mildly reduced. - Pulmonary arteries: PA peak pressure: 55 mm Hg (S).  Antibiotics:  Vancomycin 8/15  Tressie Ellis 8/15  HPI/Subjective: Afebrile, reports breathing is better. No CP. Patient denies overt bleeding.  Objective: Filed Vitals:   07/22/15 1600 07/22/15 1835 07/22/15 2028 07/22/15 2043  BP: 90/59 93/60  85/53  Pulse: 94 88 86 108  Temp: 98.2 F (36.8 C) 98.1 F (36.7 C)  97.4 F (36.3 C)  TempSrc: Oral Oral  Oral  Resp: 18 16 16 17   Height:      Weight:      SpO2: 100% 100% 100% 100%      Exam:  General: Alert, awake, oriented x3, in no acute distress. Denies CP and reports breathing is improving. No signs of overt bleeding.  HEENT: No bruits, no goiter; mild JVD on exam Heart: Regular rate, no rubs or gallops; trace edema bilaterally  Lungs: decrease BS at bases, no wheezing, positive scattered rhonchi Abdomen: Soft, nontender, nondistended, positive bowel sounds.  Neuro: Grossly intact, nonfocal.   Data Reviewed: Basic Metabolic Panel:  Recent Labs Lab 07/21/15 0947 07/22/15 0337  NA 135 135  K 4.7 3.6  CL 100* 100*  CO2 23 22  GLUCOSE 91 73  BUN 55* 51*  CREATININE 3.14* 3.06*  CALCIUM 9.4 9.0  MG  --  2.4   CBC:  Recent Labs Lab 07/21/15 0947 07/22/15 0337  WBC 11.3* 7.9  NEUTROABS 8.4*  --   HGB 7.0* 11.0*  HCT 20.6* 33.3*  MCV 95.8 96.0  PLT 176 143*   Cardiac Enzymes:  Recent Labs Lab 07/21/15 0947  TROPONINI <0.03   BNP (last 3 results)  Recent Labs  02/23/15 1001 04/23/15 0503 07/21/15 0947  BNP 568.0* 1218.9* 781.6*    ProBNP (last 3 results)  Recent Labs  09/27/14 0523 11/19/14 0745  PROBNP 9164.0* 5233.0*    CBG:  Recent Labs Lab 07/21/15 2155 07/22/15 0604 07/22/15 0616 07/22/15 1132 07/22/15 1625  GLUCAP 111* 67 78 120* 133*    Recent Results (from the past 240 hour(s))  Culture, blood (routine x 2)     Status: None (Preliminary result)   Collection Time: 07/21/15  4:29 PM  Result Value Ref Range Status   Specimen Description BLOOD RIGHT ARM  Final   Special Requests BOTTLES DRAWN AEROBIC AND ANAEROBIC 5CC  Final   Culture NO GROWTH < 24 HOURS  Final   Report Status PENDING  Incomplete  Culture, blood (routine x 2)     Status: None (Preliminary result)   Collection Time: 07/21/15  4:35 PM  Result Value Ref Range Status   Specimen Description BLOOD LEFT ARM  Final   Special Requests BOTTLES DRAWN AEROBIC AND ANAEROBIC 5CC  Final   Culture NO GROWTH < 24 HOURS  Final   Report Status PENDING   Incomplete     Studies: Dg Chest 2 View  07/21/2015   CLINICAL DATA:  Wheezing beginning last night.  Initial encounter.  EXAM: CHEST  2 VIEW  COMPARISON:  Single view of the chest 04/25/2015. PA and lateral chest 05/19/2014.  FINDINGS: Heart size is mildly enlarged. Pacing device is in place and appears unchanged. Patchy airspace disease is identified in the right upper lobe. The left lung appears clear. No pneumothorax or pleural effusion.  IMPRESSION: Patchy right upper lobe airspace disease most worrisome for pneumonia. Recommend followup to clearing.   Electronically Signed   By: Inge Rise M.D.   On: 07/21/2015 09:28    Scheduled Meds: . aspirin EC  81 mg Oral Daily  . cefTAZidime (FORTAZ)  IV  2 g Intravenous Q24H  . docusate sodium  100 mg Oral BID  . insulin aspart  0-9 Units Subcutaneous TID WC  . insulin glargine  20 Units Subcutaneous Daily  . isosorbide-hydrALAZINE  0.5 tablet  Oral TID  . metoprolol succinate  25 mg Oral QHS  . metoprolol succinate  50 mg Oral Daily  . pantoprazole  40 mg Oral Daily  . rosuvastatin  40 mg Oral q1800  . sodium chloride  3 mL Intravenous Q12H  . sodium chloride  3 mL Intravenous Q12H  . vancomycin  1,000 mg Intravenous Q24H   Continuous Infusions:   Time: 30 minutes  Barton Dubois  [facility] Pager [phone]. If 7PM-7AM, please contact night-coverage at [email or web address], password [facility] 07/22/2015, 8:46 PM  LOS: 1 day

## 2015-07-22 NOTE — Consult Note (Signed)
Advanced Heart Failure Team Consult Note  Referring Physician: Dr Dyann Kief Primary Physician: Dr Gerarda Fraction  Primary Cardiologist:  Dr Haroldine Laws  Reason for Consultation: Heart Failure   HPI:    [name] is a 59 year old with a history of CAD and ischemic cardiomyopathy,  2001 BMS to LAD, PTCH ramus BMS LAD 2009, St Jude BiV ICD generator change 04/2015 , paroxysmal atrial fibrillation, rectal ca-colostomy, OSA, CKD, and traumatic SAH in 1/16 after a fall. In the past he has been followed at the heart failure clinic at [facility] by Dr Carolynn Serve.    Last admit was Apr 23 2015 for ADHF.  Hospital course was complicated by NSTEMI. Due to elevated creatinine cath was deferred and he had myoview. Myoview on May 21st showed--1.  large region of prior infarct involving the apical anterior, anteroseptal, inferoseptal and inferior walls with extension to the true apex. 2. Small region of reversible/inducible ischemia along the margin of the prior infarct at the mid ventricular anterior and anteroseptal walls. 3. No change from previous.  GI was consulted due to elevated LFTs. Dr Carlean Purl ok with resuming amio.   He has been followed closely in the HF clinic and was last seen August 8th. At that time he was in A fib >80% of the time so amio and ranexa stopped.    Presented to [facility] with increased cough, SOB, and dizziness. Blood cultures obtained. CXR concerning for RUL pneumonia. Started on fortaz and vanc. No BRBPR. No black stools in pouch. Hgb 7.0 --given 1UPRBCs.    Pertinent admission labs include: BNP 781 K 4.7 Hgb 7.0 WBC 11.3 Creatinine 3.14 Troponin < 0.03.     Review of Systems: [y] = yes, [ ]  = no   General: Weight gain [ ] ; Weight loss [ ] ; Anorexia [ ] ; Fatigue [Y ]; Fever [ ] ; Chills [ ] ; Weakness [Y ]  Cardiac: Chest pain/pressure [ ] ; Resting SOB [ ] ; Exertional SOB [Y ]; Orthopnea [ ] ; Pedal Edema [ ] ; Palpitations [ ] ; Syncope [ ] ; Presyncope [Y ]; Paroxysmal nocturnal dyspnea[ ]   Pulmonary:  Cough Jazmín.Cullens ]; Wheezing[ ] ; Hemoptysis[ ] ; Sputum [ ] ; Snoring [ ]   GI: Vomiting[ ] ; Dysphagia[ ] ; Melena[ ] ; Hematochezia [ ] ; Heartburn[ ] ; Abdominal pain [ ] ; Constipation [ ] ; Diarrhea [ ] ; BRBPR [ ]   GU: Hematuria[ ] ; Dysuria [ ] ; Nocturia[ ]   Vascular: Pain in legs with walking [ ] ; Pain in feet with lying flat [ ] ; Non-healing sores [ ] ; Stroke [ ] ; TIA [ ] ; Slurred speech [ ] ;  Neuro: Headaches[ ] ; Vertigo[ ] ; Seizures[ ] ; Paresthesias[ ] ;Blurred vision [ ] ; Diplopia [ ] ; Vision changes [ ]   Ortho/Skin: Arthritis [ ] ; Joint pain [ Y]; Muscle pain [ ] ; Joint swelling [ ] ; Back Pain [ ] ; Rash [ ]   Psych: Depression[ ] ; Anxiety[ ]   Heme: Bleeding problems [ ] ; Clotting disorders [ ] ; Anemia [Y  Endocrine: Diabetes [ Y]; Thyroid dysfunction[ ]   Home Medications Prior to Admission medications   Medication Sig Start Date End Date Taking? Authorizing Provider  apixaban (ELIQUIS) 5 MG TABS tablet Take 5 mg by mouth 2 (two) times daily.   Yes Historical Provider, MD  aspirin EC 81 MG tablet Take 81 mg by mouth daily.   Yes Historical Provider, MD  co-enzyme Q-10 50 MG capsule Take 50 mg by mouth every morning.    Yes Historical Provider, MD  fluticasone (FLONASE) 50 MCG/ACT nasal spray Place 1 spray into both  nostrils daily as needed for allergies.  11/21/13  Yes Historical Provider, MD  furosemide (LASIX) 40 MG tablet Take 1 tablet (40 mg total) by mouth daily. 04/27/15  Yes Luke K Kilroy, PA-C  glimepiride (AMARYL) 1 MG tablet Take 1 mg by mouth daily with breakfast.   Yes Historical Provider, MD  Insulin Glargine (TOUJEO SOLOSTAR [place]) Inject 30 Units into the skin every morning.   Yes Historical Provider, MD  isosorbide dinitrate (ISORDIL) 30 MG tablet Take 15 mg by mouth 2 (two) times daily. 06/24/15  Yes Historical Provider, MD  metoprolol succinate (TOPROL-XL) 25 MG 24 hr tablet Take 25-50 mg by mouth See admin instructions. 06/24/15  Yes Historical Provider, MD  nitroGLYCERIN (NITROLINGUAL) 0.4  MG/SPRAY spray Place 1 spray under the tongue every 5 (five) minutes as needed. angina 07/19/14  Yes Arnoldo Lenis, MD  pantoprazole (PROTONIX) 40 MG tablet Take 1 tablet (40 mg total) by mouth daily. 05/14/15  Yes Arnoldo Lenis, MD  rosuvastatin (CRESTOR) 40 MG tablet Take 1 tablet (40 mg total) by mouth daily at 6 PM. 04/27/15  Yes Erlene Quan, PA-C    Past Medical History: Past Medical History  Diagnosis Date  . Essential hypertension, benign   . CAD (coronary artery disease)     a. BMS to LAD 2001 at [facility] b. PTCA/atherectomy ramus and BMS to LAD 2009  . Chronic systolic heart failure   . GERD (gastroesophageal reflux disease)   . Paroxysmal atrial fibrillation     a. on amiodarone, digoxin and Eliquis  . Adenocarcinoma of rectum     a. 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 Prostate cancer     a. s/p seed implants with chemo and radiation  . TIA (transient ischemic attack)   . OSA on CPAP   . HLD (hyperlipidemia)   . Orthostatic hypotension   . Dizziness     a. chronic. Admission for this 07/18/2014  . History of falling July 2015    due to dizziness from medications  . Ischemic cardiomyopathy     EF 18% by nuclear study 2016, multiple myocardial infarctions in past    . Chronic kidney disease, stage IV (severe) 12/11/2013  . DM type 2, uncontrolled, with renal complications 0/25/8527  . Obesity 12/12/2013  . Hematuria 03/2015  . HCAP (healthcare-associated pneumonia) 07/21/2015  . AICD (automatic cardioverter/defibrillator) present 2009    BiVentricular (upgrade)  . 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 History of blood transfusion     "I've had 2 units so far this year" (07/21/2015)  . Anemia     Past Surgical History: Past Surgical History  Procedure Laterality Date  . Internal defibrillator and pacemaker  2002  . Abdominal and perineal resection of rectum with total mesorectal excision  10/04/2007  . Colonoscopy  09/14/2011    Dr. Gala Romney: via colostomy,  Single pedunculated benign inflammatory polyp. Due for surveillance Oct 2015  . Colostomy  2008  . Colonoscopy N/A 07/02/2014    Procedure: COLONOSCOPY;  Surgeon: Daneil Dolin, MD;  Location: [name];  Service: Endoscopy;  Laterality: N/A;  7:30 / COLONOSCOPY THRU COLOSTOMY  . Esophagogastroduodenoscopy N/A 07/02/2014    Procedure: ESOPHAGOGASTRODUODENOSCOPY (EGD);  Surgeon: Daneil Dolin, MD;  Location: [name];  Service: Endoscopy;  Laterality: N/A;  7:30  . Savory dilation N/A 07/02/2014    Procedure: SAVORY DILATION;  Surgeon: Daneil Dolin, MD;  Location: [name];  Service: Endoscopy;  Laterality: N/A;  7:[place] dilation N/A 07/02/2014    Procedure: Venia Minks DILATION;  Surgeon: Daneil Dolin, MD;  Location: [name];  Service: Endoscopy;  Laterality: N/A;  7:30  . Portacath placement  06/2007    "removed ~ 1 yr later"  . Left heart catheterization with coronary angiogram N/A 07/13/2013    Procedure: LEFT HEART CATHETERIZATION WITH CORONARY ANGIOGRAM;  Surgeon: Lorretta Harp, MD;  Location[facility] CATH LAB;  Service: Cardiovascular;  Laterality: N/A;  . Colonoscopy N/A 12/11/2014    Dr. Gala Romney via colostomy. Normal. Repeat in 2021.   Marland Kitchen Esophagogastroduodenoscopy N/A 12/11/2014    SHF:WYOVZC EGD  . Cardiac defibrillator placement  2009    Upgraded to a BiV ICD  . Right heart catheterization N/A 02/24/2015    Procedure: RIGHT HEART CATH;  Surgeon: Jolaine Artist, MD;  Location: [facility] CATH LAB;  Service: Cardiovascular;  Laterality: N/A;  . Ep implantable device N/A 04/10/2015    Procedure: Ppm/Biv Ppm Generator Changeout;  Surgeon: Evans Lance, MD;  Location: [facility] INVASIVE CV LAB CUPID;  Service: Cardiovascular;  Laterality: N/A;  . Bi-ventricular implantable cardioverter defibrillator  (crt-d)  2009  . Cardiac catheterization  08/2001; 2009    ; /notes 07/10/2013  . Coronary angioplasty with stent placement  2001; ~ 2006    "1 + 1"     Family History: Family  History  Problem Relation Age of Onset  . Colon cancer Mother 89  . Colon cancer Sister 58  . Coronary artery disease Father   . Colon cancer Other     2 cousins, succumbed to illness    Social History: Social History   Social History  . Marital Status: Married    Spouse Name: N/A  . Number of Children: 1  . Years of Education: N/A   Occupational History  .     Social History Main Topics  . Smoking status: Never Smoker   . Smokeless tobacco: Never Used  . Alcohol Use: No     Comment: Former user 45 years ago  . Drug Use: No  . Sexual Activity: No   Other Topics Concern  . None   Social History Narrative   Married 56 years   1 son, 2 grandkids    Denies alcohol, drugs, tobacco     Allergies:  No Known Allergies  Objective:    Vital Signs:   Temp:  [97.6 F (36.4 C)-97.9 F (36.6 C)] 97.9 F (36.6 C) (08/16 0619) Pulse Rate:  [65-106] 86 (08/16 0800) Resp:  [12-23] 18 (08/16 0800) BP: (84-101)/(52-72) 99/65 mmHg (08/16 0800) SpO2:  [95 %-100 %] 100 % (08/16 0619) Weight:  [199 lb (90.266 kg)-202 lb 4.8 oz (91.763 kg)] 199 lb (90.266 kg) (08/16 0619) Last BM Date: 07/22/15  Weight change: Filed Weights   07/21/15 0855 07/21/15 2013 07/22/15 0619  Weight: 202 lb 4.8 oz (91.763 kg) 200 lb 6.4 oz (90.901 kg) 199 lb (90.266 kg)    Intake/Output:   Intake/Output Summary (Last 24 hours) at 07/22/15 0853 Last data filed at 07/22/15 0616  Gross per 24 hour  Intake    875 ml  Output   1075 ml  Net   -200 ml     Physical Exam: General:  Well appearing. No resp difficulty. In bed  HEENT: normal Neck: supple. JVP to jaw . Carotids 2+ bilat; no bruits. No lymphadenopathy or thryomegaly appreciated. Cor: PMI nondisplaced.Irregular rate & rhythm. No rubs, gallops or murmurs. Lungs: RUL RLL  coarse  Abdomen: soft, nontender, nondistended. No hepatosplenomegaly. No bruits or masses. Good bowel sounds. LUQ colostomy  Extremities: no cyanosis, clubbing, rash,  edema. R and LLE SCDs.  Neuro: alert & orientedx3, cranial nerves grossly intact. moves all 4 extremities w/o difficulty. Affect pleasant  Telemetry: A fib V paced 70s.   Labs: Basic Metabolic Panel:  Recent Labs Lab 07/21/15 0947 07/22/15 0337  NA 135 135  K 4.7 3.6  CL 100* 100*  CO2 23 22  GLUCOSE 91 73  BUN 55* 51*  CREATININE 3.14* 3.06*  CALCIUM 9.4 9.0  MG  --  2.4    Liver Function Tests: No results for input(s): AST, ALT, ALKPHOS, BILITOT, PROT, ALBUMIN in the last 168 hours. No results for input(s): LIPASE, AMYLASE in the last 168 hours. No results for input(s): AMMONIA in the last 168 hours.  CBC:  Recent Labs Lab 07/21/15 0947 07/22/15 0337  WBC 11.3* 7.9  NEUTROABS 8.4*  --   HGB 7.0* 11.0*  HCT 20.6* 33.3*  MCV 95.8 96.0  PLT 176 143*    Cardiac Enzymes:  Recent Labs Lab 07/21/15 0947  TROPONINI <0.03    BNP: BNP (last 3 results)  Recent Labs  02/23/15 1001 04/23/15 0503 07/21/15 0947  BNP 568.0* 1218.9* 781.6*    ProBNP (last 3 results)  Recent Labs  09/27/14 0523 11/19/14 0745  PROBNP 9164.0* 5233.0*     CBG:  Recent Labs Lab 07/21/15 1524 07/21/15 1729 07/21/15 2155 07/22/15 0604 07/22/15 0616  GLUCAP 117* 92 111* 67 78    Coagulation Studies: No results for input(s): LABPROT, INR in the last 72 hours.  Other results: DJS:HFWYO Tach 101 BPM  Imaging: Dg Chest 2 View  07/21/2015   CLINICAL DATA:  Wheezing beginning last night.  Initial encounter.  EXAM: CHEST  2 VIEW  COMPARISON:  Single view of the chest 04/25/2015. PA and lateral chest 05/19/2014.  FINDINGS: Heart size is mildly enlarged. Pacing device is in place and appears unchanged. Patchy airspace disease is identified in the right upper lobe. The left lung appears clear. No pneumothorax or pleural effusion.  IMPRESSION: Patchy right upper lobe airspace disease most worrisome for pneumonia. Recommend followup to clearing.   Electronically Signed   By:  Inge Rise M.D.   On: 07/21/2015 09:28      Medications:     Current Medications: . apixaban  5 mg Oral BID  . aspirin EC  81 mg Oral Daily  . cefTAZidime (FORTAZ)  IV  2 g Intravenous Q24H  . docusate sodium  100 mg Oral BID  . insulin aspart  0-9 Units Subcutaneous TID WC  . insulin glargine  20 Units Subcutaneous Daily  . metoprolol succinate  25 mg Oral QHS  . metoprolol succinate  50 mg Oral Daily  . pantoprazole  40 mg Oral Daily  . rosuvastatin  40 mg Oral q1800  . sodium chloride  3 mL Intravenous Q12H  . sodium chloride  3 mL Intravenous Q12H  . vancomycin  1,000 mg Intravenous Q24H     Infusions:      Assessment:  1. CAP 2. Anemia -- Hgb on admit 7.0  3. A/C Systolic Heart Failure - ICM  4. CKD Stage IV Creatinine baseline 2.8-3.2  5. DMII 6. CAD- ICM recent STEMI 2016. BMS LAD 2001 & 2009  7.H/O [facility] 12/2014  8. NSVT- Mag 2.4  9. Chronic A fib - Chads Vasc Score 4.  10 H/O TIA  Plan/Discussion:   Robert Pasquarelli is a 59 year old with ICM, chronic systolic hf, chronic A fib, and CKD admitted with dizziness, hypotension, and increased dyspnea on exertion. Diuretics and nitrates held.   Hemoglobin on admit was 7.0 with no evidence of bleeding. Received 1UPRBCs with appropriate rise to 11.0.  Has been on aspirin and eilquis for  Chronic A fib. For now continue eliquis but will need to watch closely. Will need GI follow up. In the past he has been evaluated by GI in [place].   CXR concerning for RUL pneumonia. On antibiotics per primary team. Day 2 of fortaz and vanc. Blood cultures pending. WBC not elevated. Afebrile.   From HF perspective he does have volume overload noted. Give 40 mg IV lasix now. SBP typically runs 80-90s. Continue current dose of metoprolol. Restart bidil 1/2 tab three times a day.   Ask St Jude to interrogate device. Last HF visit ranexa and amio stopped as he was in A fib > 80% of the time.     Length of Stay: 1  CLEGG,AMY  NP-C 07/22/2015, 8:53 AM  Advanced Heart Failure Team Pager [phone] (M-F; [place])  Please contact [facility] for night-coverage after hours (4p -7a ) and weekends on [email or web address]  Patient seen and examined with Darrick Grinder, NP. We discussed all aspects of the encounter. I agree with the assessment and plan as stated above.   He is mildly volume overloaded after RBC transfusion. Agree with IV lasix. Will likely need a 2nd unit RBCs. Overall HF status not to bad though. He has had recent EGD and colonoscopy with no source for iron-deficient anemia. Despite negative FOBT will likely need capsule endoscopy ads outpatient and I discussed this with Walden Field, NP in Dr. Roseanne Kaufman GI office. May also need to see heme for Aranesp. ICD interrogated at bedside and shows PAF > 80% of time so amio stopped recently. Continue rate control strategy. Will continue Eliquis carefully. Treat PNA per primary team.   Bensimhon, Daniel,MD

## 2015-07-23 ENCOUNTER — Encounter: Payer: Self-pay | Admitting: *Deleted

## 2015-07-23 ENCOUNTER — Encounter (HOSPITAL_COMMUNITY)
Admission: EM | Disposition: A | Discharge status: Home / Self Care | Payer: Self-pay | Source: Home / Self Care | Attending: Internal Medicine

## 2015-07-23 DIAGNOSIS — N179 Acute kidney failure, unspecified: Secondary | ICD-10-CM

## 2015-07-23 DIAGNOSIS — I480 Paroxysmal atrial fibrillation: Secondary | ICD-10-CM

## 2015-07-23 DIAGNOSIS — E1129 Type 2 diabetes mellitus with other diabetic kidney complication: Secondary | ICD-10-CM

## 2015-07-23 DIAGNOSIS — D638 Anemia in other chronic diseases classified elsewhere: Secondary | ICD-10-CM

## 2015-07-23 DIAGNOSIS — E1165 Type 2 diabetes mellitus with hyperglycemia: Secondary | ICD-10-CM

## 2015-07-23 DIAGNOSIS — J189 Pneumonia, unspecified organism: Secondary | ICD-10-CM

## 2015-07-23 DIAGNOSIS — N189 Chronic kidney disease, unspecified: Secondary | ICD-10-CM

## 2015-07-23 HISTORY — PX: GIVENS CAPSULE STUDY: SHX5432

## 2015-07-23 LAB — GLUCOSE, CAPILLARY
GLUCOSE-CAPILLARY: 101 mg/dL — AB (ref 65–99)
GLUCOSE-CAPILLARY: 126 mg/dL — AB (ref 65–99)
GLUCOSE-CAPILLARY: 153 mg/dL — AB (ref 65–99)
Glucose-Capillary: 88 mg/dL (ref 65–99)

## 2015-07-23 LAB — CBC
HEMATOCRIT: 30.5 % — AB (ref 39.0–52.0)
Hemoglobin: 10.2 g/dL — ABNORMAL LOW (ref 13.0–17.0)
MCH: 31.6 pg (ref 26.0–34.0)
MCHC: 33.4 g/dL (ref 30.0–36.0)
MCV: 94.4 fL (ref 78.0–100.0)
Platelets: 120 10*3/uL — ABNORMAL LOW (ref 150–400)
RBC: 3.23 MIL/uL — ABNORMAL LOW (ref 4.22–5.81)
RDW: 16.6 % — AB (ref 11.5–15.5)
WBC: 5.5 10*3/uL (ref 4.0–10.5)

## 2015-07-23 LAB — BASIC METABOLIC PANEL
ANION GAP: 10 (ref 5–15)
BUN: 44 mg/dL — ABNORMAL HIGH (ref 6–20)
CALCIUM: 8.5 mg/dL — AB (ref 8.9–10.3)
CO2: 22 mmol/L (ref 22–32)
CREATININE: 2.89 mg/dL — AB (ref 0.61–1.24)
Chloride: 99 mmol/L — ABNORMAL LOW (ref 101–111)
GFR calc Af Amer: 26 mL/min — ABNORMAL LOW (ref >60)
GFR calc non Af Amer: 22 mL/min — ABNORMAL LOW (ref >60)
GLUCOSE: 104 mg/dL — AB (ref 65–99)
Potassium: 3.6 mmol/L (ref 3.5–5.1)
Sodium: 131 mmol/L — ABNORMAL LOW (ref 135–145)

## 2015-07-23 SURGERY — IMAGING PROCEDURE, GI TRACT, INTRALUMINAL, VIA CAPSULE
Anesthesia: LOCAL

## 2015-07-23 MED ORDER — FUROSEMIDE 40 MG PO TABS
40.0000 mg | ORAL_TABLET | Freq: Every day | ORAL | Status: DC
  Administered 2015-07-23: 40 mg via ORAL
  Filled 2015-07-23 (×2): qty 1

## 2015-07-23 MED ORDER — ZOLPIDEM TARTRATE 5 MG PO TABS
5.0000 mg | ORAL_TABLET | Freq: Once | ORAL | Status: AC
  Administered 2015-07-23: 5 mg via ORAL
  Filled 2015-07-23: qty 1

## 2015-07-23 SURGICAL SUPPLY — 1 items: TOWEL COTTON PACK 4EA (MISCELLANEOUS) ×4 IMPLANT

## 2015-07-23 NOTE — Consult Note (Signed)
   [facility] CM Inpatient Consult   07/23/2015  [name] Nov 26, 1956 [id number] Patient was recently referred to [facility] for high risk tier. Patient evaluated for community based chronic disease management services with [place] [facility] as a benefit of patient's [employer]. Met with patient at bedside to explain [facility] services.  Consent form signed.  Patient will receive post discharge transition of care call and will be evaluated for monthly home visits for assessments and disease process education.  Left contact information and [facility] literature at bedside. Made Inpatient Case Manager aware that [facility] following. Of note, [facility] Management services does not replace or interfere with any services that are arranged by inpatient case management or social work.  For additional questions or referrals please contact:  Natividad Brood, RN BSN [facility] Liaison  [phone] business mobile phone

## 2015-07-23 NOTE — Progress Notes (Signed)
PATIENT DETAILS Name: [name] Age: 59 y.o. Sex: male Date of Birth: 1956-05-28 Admit Date: 07/21/2015 Admitting Physician Modena Jansky, MD RJJ:OACZY,SAYTKZSW J., MD  Subjective: No melanoma or hematochezia. Denies any shortness of breath. No other complaints.  Assessment/Plan: Principal Problem:  Healthcare-associated pneumonia: Significantly improved, afebrile without leukocytosis. Blood cultures negative. If clinical improvement continues, will transition to oral antibiotics on 8/18. Will require repeat chest x-ray and 6-8 weeks to document resolution of the pneumonia.  Active Problems: Acute on chronic systolic heart failur (EF 35% 04/24/15 echo): Improved,  transitioned to oral Lasix. Continue metoprolol and BiDil, not on ACEI given CKD. CHF team following.   Suspected GI bleeding: Gastroenterology consulted-since recently had colonoscopy/EGD-felt repeat endoscopic studies would be low yield, for capsular endoscopy today. No history of hematochezia, melanoma or hematemesis. Continue PPI   Suspected subacute blood loss anemia with anemia of chronic disease at baseline: Seen by gastroenterology, for capsule endoscopy today. Eliquis remains on hold. Has required 1 unit of PRBC so far, hemoglobin stable.  Acute-on-chronic kidney injury: stage 4 at baseline:creatinine now close to usual baseline. Follow lytes.   History of paroxysmal atrial fibrillation: Evaluated by cards, recommendations or rate control. Continue metoprolol, resume Eliquis on discharge  History of CAD: No chest pain or shortness of breath. Remains on aspirin-given the fact that patient is on Eliquis-may need to stop aspirin-we will discuss with cardiology. Continue beta blocker and statin.  Type 2 diabetes: CBGs stable-continue 20 units of Lantus and SSI. Follow and adjust accordingly.  Dyslipidemia: Continue Crestor  Obstructive sleep apnea: Continue CPAP daily at bedtime  History of  rectal cancer:s/p colectomy 2008 with colostomy. Colonoscopy 12/2014 normal.   H/o prostate cancer:- S/p chemo/radiation, last PSA in 5/16 was 0.09.  Disposition: Remain inpatient  Antimicrobial agents  See below  Anti-infectives    Start     Dose/Rate Route Frequency Ordered Stop   07/22/15 1100  vancomycin (VANCOCIN) IVPB 1000 mg/200 mL premix     1,000 mg 200 mL/hr over 60 Minutes Intravenous Every 24 hours 07/21/15 1050     07/21/15 1400  cefTAZidime (FORTAZ) 2 g in dextrose 5 % 50 mL IVPB  Status:  Discontinued     2 g 100 mL/hr over 30 Minutes Intravenous 3 times per day 07/21/15 1037 07/21/15 1046   07/21/15 1100  cefTAZidime (FORTAZ) 2 g in dextrose 5 % 50 mL IVPB     2 g 100 mL/hr over 30 Minutes Intravenous Every 24 hours 07/21/15 1046     07/21/15 1100  vancomycin (VANCOCIN) 2,000 mg in sodium chloride 0.9 % 500 mL IVPB     2,000 mg 250 mL/hr over 120 Minutes Intravenous  Once 07/21/15 1046 07/21/15 1353   07/21/15 1045  vancomycin (VANCOCIN) IVPB 1000 mg/200 mL premix  Status:  Discontinued     1,000 mg 200 mL/hr over 60 Minutes Intravenous  Once 07/21/15 1037 07/21/15 1046      DVT Prophylaxis:  SCD's-resume Eliqis tomorrow  Code Status: Full code   Family Communication None at bedside  Procedures: None  CONSULTS:  cardiology and GI  Time spent 30 minutes-Greater than 50% of this time was spent in counseling, explanation of diagnosis, planning of further management, and coordination of care.  MEDICATIONS: Scheduled Meds: . aspirin EC  81 mg Oral Daily  . cefTAZidime (FORTAZ)  IV  2 g Intravenous Q24H  . docusate sodium  100 mg Oral BID  . furosemide  40 mg Oral Daily  . insulin aspart  0-9 Units Subcutaneous TID WC  . insulin glargine  20 Units Subcutaneous Daily  . isosorbide-hydrALAZINE  0.5 tablet Oral TID  . metoprolol succinate  25 mg Oral QHS  . metoprolol succinate  50 mg Oral Daily  . pantoprazole  40 mg Oral Daily  . rosuvastatin  40  mg Oral q1800  . sodium chloride  3 mL Intravenous Q12H  . sodium chloride  3 mL Intravenous Q12H  . vancomycin  1,000 mg Intravenous Q24H   Continuous Infusions:  PRN Meds:.sodium chloride, acetaminophen **OR** acetaminophen, fluticasone, nitroGLYCERIN, polyethylene glycol, sodium chloride    PHYSICAL EXAM: Vital signs in last 24 hours: Filed Vitals:   07/22/15 2043 07/23/15 0500 07/23/15 0941 07/23/15 0943  BP: 85/53 86/61 89/61    Pulse: 108 108    Temp: 97.4 F (36.3 C) 97.6 F (36.4 C)    TempSrc: Oral Oral    Resp: 17 18    Height:    5\' 10"  (1.778 m)  Weight:  86.365 kg (190 lb 6.4 oz)  86.183 kg (190 lb)  SpO2: 100% 94%      Weight change: -5.398 kg (-11 lb 14.4 oz) Filed Weights   07/22/15 0619 07/23/15 0500 07/23/15 0943  Weight: 90.266 kg (199 lb) 86.365 kg (190 lb 6.4 oz) 86.183 kg (190 lb)   Body mass index is 27.26 kg/(m^2).   Gen Exam: Awake and alert with clear speech.  Neck: Supple, No JVD.   Chest: B/L Clear.   CVS: S1 S2 Regular, no murmurs. Abdomen: soft, BS +, non tender, non distended.  Extremities: no edema, lower extremities warm to touch. Neurologic: Non Focal.   Skin: No Rash.   Wounds: N/A.   Intake/Output from previous day:  Intake/Output Summary (Last 24 hours) at 07/23/15 1235 Last data filed at 07/23/15 0929  Gross per 24 hour  Intake   1116 ml  Output   2090 ml  Net   -974 ml     LAB RESULTS: CBC  Recent Labs Lab 07/21/15 0947 07/22/15 0337 07/23/15 0318  WBC 11.3* 7.9 5.5  HGB 7.0* 11.0* 10.2*  HCT 20.6* 33.3* 30.5*  PLT 176 143* 120*  MCV 95.8 96.0 94.4  MCH 32.6 31.7 31.6  MCHC 34.0 33.0 33.4  RDW 16.6* 16.9* 16.6*  LYMPHSABS 1.6  --   --   MONOABS 1.2*  --   --   EOSABS 0.2  --   --   BASOSABS 0.0  --   --     Chemistries   Recent Labs Lab 07/21/15 0947 07/22/15 0337 07/23/15 0318  NA 135 135 131*  K 4.7 3.6 3.6  CL 100* 100* 99*  CO2 23 22 22   GLUCOSE 91 73 104*  BUN 55* 51* 44*  CREATININE  3.14* 3.06* 2.89*  CALCIUM 9.4 9.0 8.5*  MG  --  2.4  --     CBG:  Recent Labs Lab 07/22/15 1132 07/22/15 1625 07/22/15 2050 07/23/15 0553 07/23/15 1138  GLUCAP 120* 133* 94 88 101*    GFR Estimated Creatinine Clearance: 28.1 mL/min (by C-G formula based on Cr of 2.89).  Coagulation profile No results for input(s): INR, PROTIME in the last 168 hours.  Cardiac Enzymes  Recent Labs Lab 07/21/15 0947  TROPONINI <0.03    Invalid input(s): POCBNP No results for input(s): DDIMER in the last 72 hours. No results for input(s): HGBA1C in the  last 72 hours. No results for input(s): CHOL, HDL, LDLCALC, TRIG, CHOLHDL, LDLDIRECT in the last 72 hours. No results for input(s): TSH, T4TOTAL, T3FREE, THYROIDAB in the last 72 hours.  Invalid input(s): FREET3 No results for input(s): VITAMINB12, FOLATE, FERRITIN, TIBC, IRON, RETICCTPCT in the last 72 hours. No results for input(s): LIPASE, AMYLASE in the last 72 hours.  Urine Studies No results for input(s): UHGB, CRYS in the last 72 hours.  Invalid input(s): UACOL, UAPR, USPG, UPH, UTP, UGL, UKET, UBIL, UNIT, UROB, ULEU, UEPI, UWBC, URBC, UBAC, CAST, UCOM, BILUA  MICROBIOLOGY: Recent Results (from the past 240 hour(s))  Culture, blood (routine x 2)     Status: None (Preliminary result)   Collection Time: 07/21/15  4:29 PM  Result Value Ref Range Status   Specimen Description BLOOD RIGHT ARM  Final   Special Requests BOTTLES DRAWN AEROBIC AND ANAEROBIC 5CC  Final   Culture NO GROWTH < 24 HOURS  Final   Report Status PENDING  Incomplete  Culture, blood (routine x 2)     Status: None (Preliminary result)   Collection Time: 07/21/15  4:35 PM  Result Value Ref Range Status   Specimen Description BLOOD LEFT ARM  Final   Special Requests BOTTLES DRAWN AEROBIC AND ANAEROBIC 5CC  Final   Culture NO GROWTH < 24 HOURS  Final   Report Status PENDING  Incomplete    RADIOLOGY STUDIES/RESULTS: Dg Chest 2 View  07/21/2015   CLINICAL  DATA:  Wheezing beginning last night.  Initial encounter.  EXAM: CHEST  2 VIEW  COMPARISON:  Single view of the chest 04/25/2015. PA and lateral chest 05/19/2014.  FINDINGS: Heart size is mildly enlarged. Pacing device is in place and appears unchanged. Patchy airspace disease is identified in the right upper lobe. The left lung appears clear. No pneumothorax or pleural effusion.  IMPRESSION: Patchy right upper lobe airspace disease most worrisome for pneumonia. Recommend followup to clearing.   Electronically Signed   By: Inge Rise M.D.   On: 07/21/2015 09:28    Oren Binet, MD  [facility] Pager:[phone]  If 7PM-7AM, please contact night-coverage [email or web address] Password [email or web address] 07/23/2015, 12:35 PM   LOS: 2 days

## 2015-07-23 NOTE — Patient Outreach (Signed)
[place] [facility]) Care Management  07/23/2015  [name] July 29, 1956 [id number]  Referral from [place] List: Received notification from hospital liaison that patient had been admitted. Plan: Telephonic RN care coordinator will sign off.   [name], RN BSN [place] Management Coordinator [facility] Care Management  [phone]

## 2015-07-23 NOTE — Progress Notes (Signed)
Pt a/o, no c/o pain, pt had capsule endoscopy done at bedside, pt has equipment on and can remove at 2140 today, pts diet being advanced per protocol, pt will have bedside swallow eval done this afternoon, BP still running soft pt asymptomatic, CBG WNL pt stable

## 2015-07-23 NOTE — Progress Notes (Signed)
Patient stated that he will put on his CPAP when he is ready to go to bed. RT informed to call RT if assistance is needed. RT will continue to monitor.

## 2015-07-23 NOTE — Patient Outreach (Signed)
[place] [facility]) Care Management  07/23/2015  KEISHON CHAVARIN September 02, 1956 [id number]   Referral from Natividad Brood, assigned Kandis Mannan, RN.  Notification sent to [name], RN to update.  Thanks, [name]. [place], [place] Assistant Phone: [phone] Fax: [phone]

## 2015-07-23 NOTE — Progress Notes (Signed)
Advanced Heart Failure Rounding Note   Subjective:   Admitted with increased cough, SOB, and dizziness. Blood cultures obtained. CXR concerning for RUL pneumonia. Started on fortaz and vanc. No BRBPR. No black stools in pouch. Hgb 7.0 --given 1UPRBCs.   Denies SOB/Orthopnea.  Objective:   Weight Range:  Vital Signs:   Temp:  [97.4 F (36.3 C)-98.2 F (36.8 C)] 97.6 F (36.4 C) (08/17 0500) Pulse Rate:  [72-108] 108 (08/17 0500) Resp:  [16-18] 18 (08/17 0500) BP: (85-102)/(53-67) 86/61 mmHg (08/17 0500) SpO2:  [94 %-100 %] 94 % (08/17 0500) FiO2 (%):  [21 %] 21 % (08/16 2028) Weight:  [190 lb 6.4 oz (86.365 kg)] 190 lb 6.4 oz (86.365 kg) (08/17 0500) Last BM Date: 07/22/15  Weight change: Filed Weights   07/21/15 2013 07/22/15 0619 07/23/15 0500  Weight: 200 lb 6.4 oz (90.901 kg) 199 lb (90.266 kg) 190 lb 6.4 oz (86.365 kg)    Intake/Output:   Intake/Output Summary (Last 24 hours) at 07/23/15 0915 Last data filed at 07/23/15 0512  Gross per 24 hour  Intake   1116 ml  Output   2090 ml  Net   -974 ml     Physical Exam: General:  Well appearing. No resp difficulty HEENT: normal Neck: supple. JVP 6-7 . Carotids 2+ bilat; no bruits. No lymphadenopathy or thryomegaly appreciated. Cor: PMI nondisplaced. Regular rate & rhythm. No rubs, gallops or murmurs. Lungs: clear Abdomen: soft, nontender, nondistended. No hepatosplenomegaly. No bruits or masses. Good bowel sounds. LUQ colostomy  Extremities: no cyanosis, clubbing, rash, R and LLE SCDs.  Neuro: alert & orientedx3, cranial nerves grossly intact. moves all 4 extremities w/o difficulty. Affect pleasant  Telemetry: A fib V Paced   Labs: Basic Metabolic Panel:  Recent Labs Lab 07/21/15 0947 07/22/15 0337 07/23/15 0318  NA 135 135 131*  K 4.7 3.6 3.6  CL 100* 100* 99*  CO2 23 22 22   GLUCOSE 91 73 104*  BUN 55* 51* 44*  CREATININE 3.14* 3.06* 2.89*  CALCIUM 9.4 9.0 8.5*  MG  --  2.4  --     Liver Function  Tests: No results for input(s): AST, ALT, ALKPHOS, BILITOT, PROT, ALBUMIN in the last 168 hours. No results for input(s): LIPASE, AMYLASE in the last 168 hours. No results for input(s): AMMONIA in the last 168 hours.  CBC:  Recent Labs Lab 07/21/15 0947 07/22/15 0337 07/23/15 0318  WBC 11.3* 7.9 5.5  NEUTROABS 8.4*  --   --   HGB 7.0* 11.0* 10.2*  HCT 20.6* 33.3* 30.5*  MCV 95.8 96.0 94.4  PLT 176 143* 120*    Cardiac Enzymes:  Recent Labs Lab 07/21/15 0947  TROPONINI <0.03    BNP: BNP (last 3 results)  Recent Labs  02/23/15 1001 04/23/15 0503 07/21/15 0947  BNP 568.0* 1218.9* 781.6*    ProBNP (last 3 results)  Recent Labs  09/27/14 0523 11/19/14 0745  PROBNP 9164.0* 5233.0*      Other results:  Imaging: Dg Chest 2 View  07/21/2015   CLINICAL DATA:  Wheezing beginning last night.  Initial encounter.  EXAM: CHEST  2 VIEW  COMPARISON:  Single view of the chest 04/25/2015. PA and lateral chest 05/19/2014.  FINDINGS: Heart size is mildly enlarged. Pacing device is in place and appears unchanged. Patchy airspace disease is identified in the right upper lobe. The left lung appears clear. No pneumothorax or pleural effusion.  IMPRESSION: Patchy right upper lobe airspace disease most worrisome for pneumonia. Recommend followup  to clearing.   Electronically Signed   By: Inge Rise M.D.   On: 07/21/2015 09:28      Medications:     Scheduled Medications: . aspirin EC  81 mg Oral Daily  . cefTAZidime (FORTAZ)  IV  2 g Intravenous Q24H  . docusate sodium  100 mg Oral BID  . insulin aspart  0-9 Units Subcutaneous TID WC  . insulin glargine  20 Units Subcutaneous Daily  . isosorbide-hydrALAZINE  0.5 tablet Oral TID  . metoprolol succinate  25 mg Oral QHS  . metoprolol succinate  50 mg Oral Daily  . pantoprazole  40 mg Oral Daily  . rosuvastatin  40 mg Oral q1800  . sodium chloride  3 mL Intravenous Q12H  . sodium chloride  3 mL Intravenous Q12H  .  vancomycin  1,000 mg Intravenous Q24H     Infusions:     PRN Medications:  sodium chloride, acetaminophen **OR** acetaminophen, fluticasone, nitroGLYCERIN, polyethylene glycol, sodium chloride   Assessment:  1. CAP 2. Anemia -- Hgb on admit 7.0  3. A/C Systolic Heart Failure - ICM  4. CKD Stage IV Creatinine baseline 2.8-3.2  5. DMII 6. CAD- ICM recent STEMI 2016. BMS LAD 2001 & 2009  7.H/O [facility] 12/2014  8. NSVT- Mag 2.4  9. Chronic A fib - Chads Vasc Score 4.  10 H/O TIA     Plan/Discussion:    Volume status much improved after dose of IV lasix. Weight down 9 pounds.  Renal function ok. Continue current HF meds and restart lasix 40 mg daily. Renal function stabile.   For capsule endoscopy today  Per GI. Hgb drifting down 11>10.2. No evidence of bleeding.    Rate controlled. Off eliquis for now.   Length of Stay: 2   CLEGG,AMY NP-C  07/23/2015, 9:15 AM  Advanced Heart Failure Team Pager [phone] (M-F; [place])  Please contact [facility] for night-coverage after hours (4p -7a ) and weekends on [email or web address]  Patient seen and examined with Darrick Grinder, NP. We discussed all aspects of the encounter. I agree with the assessment and plan as stated above.   Volume status improved but neck veins still up slightly. May need one more dose of IV lasix tomorrow. We will see how it goes. Hgb improved after transfusion. Now undergoing capsule endo. May need aranesp.   If capsule endo negative would restart Eliquis.   Satoya Feeley,MD 11:13 PM

## 2015-07-23 NOTE — Evaluation (Signed)
Clinical/Bedside Swallow Evaluation Patient Details  Name: [name] MRN: [id number] Date of Birth: 1956/03/08  Today's Date: 07/23/2015 Time: SLP Start Time (ACUTE ONLY): 1620 SLP Stop Time (ACUTE ONLY): 1628 SLP Time Calculation (min) (ACUTE ONLY): 8 min  Past Medical History:  Past Medical History  Diagnosis Date  . Essential hypertension, benign   . CAD (coronary artery disease)     a. BMS to LAD 2001 at [facility] b. PTCA/atherectomy ramus and BMS to LAD 2009  . Chronic systolic heart failure   . GERD (gastroesophageal reflux disease)   . Paroxysmal atrial fibrillation     a. on amiodarone, digoxin and Eliquis  . Adenocarcinoma of rectum     a. 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 Prostate cancer     a. s/p seed implants with chemo and radiation  . TIA (transient ischemic attack)   . OSA on CPAP   . HLD (hyperlipidemia)   . Orthostatic hypotension   . Dizziness     a. chronic. Admission for this 07/18/2014  . History of falling July 2015    due to dizziness from medications  . Ischemic cardiomyopathy     EF 18% by nuclear study 2016, multiple myocardial infarctions in past    . Chronic kidney disease, stage IV (severe) 12/11/2013  . DM type 2, uncontrolled, with renal complications 01/27/2014  . Obesity 12/12/2013  . Hematuria 03/2015  . HCAP (healthcare-associated pneumonia) 07/21/2015  . AICD (automatic cardioverter/defibrillator) present 2009    BiVentricular (upgrade)  . 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 History of blood transfusion     "I've had 2 units so far this year" (07/21/2015)  . Anemia    Past Surgical History:  Past Surgical History  Procedure Laterality Date  . Internal defibrillator and pacemaker  2002  . Abdominal and perineal resection of rectum with total mesorectal excision  10/04/2007  . Colonoscopy  09/14/2011    Dr. Jena Gauss: via colostomy, Single pedunculated benign inflammatory polyp. Due for surveillance Oct 2015  . Colostomy  2008  .  Colonoscopy N/A 07/02/2014    Procedure: COLONOSCOPY;  Surgeon: Corbin Ade, MD;  Location: [facility];  Service: Endoscopy;  Laterality: N/A;  7:30 / COLONOSCOPY THRU COLOSTOMY  . Esophagogastroduodenoscopy N/A 07/02/2014    Procedure: ESOPHAGOGASTRODUODENOSCOPY (EGD);  Surgeon: Corbin Ade, MD;  Location: [facility] END[facility];  Service: Endoscopy;  Laterality: N/A;  7:30  . Savory dilation N/A 07/02/2014    Procedure: SAVORY DILATION;  Surgeon: Corbin Ade, MD;  Location: [facility] END[facility];  Service: Endoscopy;  Laterality: N/A;  7:30  . Maloney dilation N/A 07/02/2014    Procedure: Elease Hashimoto DILATION;  Surgeon: Corbin Ade, MD;  Location: [facility] ENDO SU[facility];  Service: Endoscopy;  Laterality: N/A;  7:30  . Portacath placement  06/2007    "removed ~ 1 yr later"  . Left heart catheterization with coronary angiogram N/A 07/13/2013    Procedure: LEFT HEART CATHETERIZATION WITH CORONARY ANGIOGRAM;  Surgeon: [name], MD;  Location: [facility] CATH LAB;  Service: Cardiovascular;  Laterality: N/A;  . Colonoscopy N/A 12/11/2014    Dr. Jena Gauss via colostomy. Normal. Repeat in 2021.   Marland Kitchen Esophagogastroduodenoscopy N/A 12/11/2014    WUJ:[id number] EGD  . Cardiac defibrillator placement  2009    Upgraded to a BiV ICD  . Right heart catheterization N/A 02/24/2015    Procedure: RIGHT HEART CATH;  Surgeon: Dolores Patty, MD;  Location: [facility] CATH LAB;  Service: Cardiovascular;  Laterality: N/A;  . Ep implantable device N/A 04/10/2015    Procedure: Ppm/Biv Ppm Generator Changeout;  Surgeon: Marinus Maw, MD;  Location: [facility] INVASIVE CV LAB CUPID;  Service: Cardiovascular;  Laterality: N/A;  . Bi-ventricular implantable cardioverter defibrillator  (crt-d)  2009  . Cardiac catheterization  08/2001; 2009    ; /notes 07/10/2013  . Coronary angioplasty with stent placement  2001; ~ 2006    "1 + 1"    HPI:  Patient admitted 07/21/15 with progressive shortness of breath, wheezing and a non-productive cough.  Symptoms started 07/19/15,  which he states is his usual presentation of a CHF exacerbation. Per his cardiologist, he took an extra dose of Lasix, but the symptoms did not improve prompting him to come to the ED.  Chest X-ray showing possible right upper lobe pneumonia. Orders received for bedside swallow evaluation.      Assessment / Plan / Recommendation Clinical Impression  Bedside swallow evaluation complete, per RN ok for small snack as procedure runs. Patient presents with an unremarkable oral motor exam.  Regular, puree textures and thin liquids were consumed with efficient mastication, oral clearance and timely swallow initiation with no overt s/s of aspiration. Recommend patient initiate a regular texture and thin liquid diet once procedure ends.  No skilled SLP services needed at this time, education complete and SLP signing off.     Aspiration Risk  Mild    Diet Recommendation Age appropriate regular solids;Thin   Medication Administration: Whole meds with liquid    Other  Recommendations Oral Care Recommendations: Oral care BID   Follow Up Recommendations  None           Pertinent Vitals/Pain None     Swallow Study Prior Functional Status   Regular textures and thin liquids     General Other Pertinent Information: Patient admitted 07/21/15 with progressive shortness of breath, wheezing and a non-productive cough.  Symptoms started 07/19/15, which he states is his usual presentation of a CHF exacerbation. Per his cardiologist, he took an extra dose of Lasix, but the symptoms did not improve prompting him to come to the ED.  Chest X-ray showing possible right upper lobe pneumonia. Orders received for bedside swallow evaluation.    Type of Study: Bedside swallow evaluation Previous Swallow Assessment: none on record Diet Prior to this Study: Thin liquids Temperature Spikes Noted: No Respiratory Status: Room air History of Recent Intubation: No Behavior/Cognition: Alert;Cooperative;Pleasant mood Oral  Cavity - Dentition: Adequate natural dentition/normal for age Self-Feeding Abilities: Able to feed self Patient Positioning: Partially reclined Baseline Vocal Quality: Normal Volitional Cough: Strong Volitional Swallow: Able to elicit    Oral/Motor/Sensory Function Overall Oral Motor/Sensory Function: Appears within functional limits for tasks assessed   Ice Chips Ice chips: Not tested   Thin Liquid Thin Liquid: Within functional limits Presentation: Cup;Straw;Self Fed    Nectar Thick Nectar Thick Liquid: Not tested   Honey Thick Honey Thick Liquid: Not tested   Puree Puree: Within functional limits Presentation: Self Fed;Spoon   Solid   GO    Solid: Within functional limits Presentation: Self Fed      Fae Pippin, M.A., CCC-SLP [phone]  Trenace Coughlin 07/23/2015,4:49 PM

## 2015-07-23 NOTE — Progress Notes (Signed)
SLP Cancellation Note  Patient Details Name: SEIYA SILSBY MRN: [id number] DOB: 04/25/1956   Cancelled treatment:       Reason Eval/Treat Not Completed: Other (comment) (Patient NPO for a procedure; as a result, bedside swallow evaluation cannot be completed at this time.) SLP will follow-up as able.  Gunnar Fusi, M.A., CCC-SLP [phone]  [place] 07/23/2015, 9:33 AM

## 2015-07-24 ENCOUNTER — Encounter (HOSPITAL_COMMUNITY): Payer: Self-pay | Admitting: Gastroenterology

## 2015-07-24 DIAGNOSIS — I5023 Acute on chronic systolic (congestive) heart failure: Principal | ICD-10-CM

## 2015-07-24 LAB — CBC
HCT: 32.6 % — ABNORMAL LOW (ref 39.0–52.0)
HEMOGLOBIN: 10.7 g/dL — AB (ref 13.0–17.0)
MCH: 31.6 pg (ref 26.0–34.0)
MCHC: 32.8 g/dL (ref 30.0–36.0)
MCV: 96.2 fL (ref 78.0–100.0)
Platelets: 142 10*3/uL — ABNORMAL LOW (ref 150–400)
RBC: 3.39 MIL/uL — AB (ref 4.22–5.81)
RDW: 16.4 % — ABNORMAL HIGH (ref 11.5–15.5)
WBC: 7.2 10*3/uL (ref 4.0–10.5)

## 2015-07-24 LAB — BASIC METABOLIC PANEL
Anion gap: 9 (ref 5–15)
BUN: 41 mg/dL — AB (ref 6–20)
CHLORIDE: 102 mmol/L (ref 101–111)
CO2: 26 mmol/L (ref 22–32)
Calcium: 9.3 mg/dL (ref 8.9–10.3)
Creatinine, Ser: 2.95 mg/dL — ABNORMAL HIGH (ref 0.61–1.24)
GFR calc Af Amer: 25 mL/min — ABNORMAL LOW (ref >60)
GFR calc non Af Amer: 22 mL/min — ABNORMAL LOW (ref >60)
Glucose, Bld: 103 mg/dL — ABNORMAL HIGH (ref 65–99)
POTASSIUM: 4.4 mmol/L (ref 3.5–5.1)
SODIUM: 137 mmol/L (ref 135–145)

## 2015-07-24 LAB — GLUCOSE, CAPILLARY
GLUCOSE-CAPILLARY: 105 mg/dL — AB (ref 65–99)
GLUCOSE-CAPILLARY: 187 mg/dL — AB (ref 65–99)

## 2015-07-24 MED ORDER — FUROSEMIDE 10 MG/ML IJ SOLN
40.0000 mg | Freq: Once | INTRAMUSCULAR | Status: DC
  Filled 2015-07-24: qty 4

## 2015-07-24 MED ORDER — AMOXICILLIN-POT CLAVULANATE 500-125 MG PO TABS: 1.0000 | ORAL_TABLET | Freq: Two times a day (BID) | ORAL | Status: DC

## 2015-07-24 MED ORDER — FUROSEMIDE 40 MG PO TABS
40.0000 mg | ORAL_TABLET | Freq: Every day | ORAL | Status: DC
  Administered 2015-07-24: 40 mg via ORAL
  Filled 2015-07-24: qty 1

## 2015-07-24 MED ORDER — AMOXICILLIN-POT CLAVULANATE 500-125 MG PO TABS
1.0000 | ORAL_TABLET | Freq: Two times a day (BID) | ORAL | Status: DC
  Administered 2015-07-24: 500 mg via ORAL
  Filled 2015-07-24 (×2): qty 1

## 2015-07-24 MED ORDER — ISOSORB DINITRATE-HYDRALAZINE 20-37.5 MG PO TABS: 0.5000 | ORAL_TABLET | Freq: Three times a day (TID) | ORAL | Status: DC

## 2015-07-24 MED ORDER — FERROUS SULFATE 325 (65 FE) MG PO TABS: 325.0000 mg | ORAL_TABLET | Freq: Two times a day (BID) | ORAL | Status: DC

## 2015-07-24 MED ORDER — INSULIN ASPART 100 UNIT/ML ~~LOC~~ SOLN
0.0000 [IU] | Freq: Three times a day (TID) | SUBCUTANEOUS | Status: DC
Start: 2015-07-24 — End: 2015-07-24
  Administered 2015-07-24: 2 [IU] via SUBCUTANEOUS

## 2015-07-24 MED ORDER — PANTOPRAZOLE SODIUM 40 MG PO TBEC: 40.0000 mg | DELAYED_RELEASE_TABLET | Freq: Every day | ORAL | Status: DC

## 2015-07-24 MED ORDER — FUROSEMIDE 10 MG/ML IJ SOLN: 40.0000 mg | Freq: Once | INTRAMUSCULAR | Status: DC

## 2015-07-24 NOTE — Discharge Summary (Signed)
PATIENT DETAILS Name: [name] Age: 59 y.o. Sex: male Date of Birth: [birth year] MRN: [id number]. Admitting Physician: Elease Etienne, MD WUJ:WJXBJ,YNWGNFAO J., MD  Admit Date: 07/21/2015 Discharge date: 07/24/2015  Recommendations for Outpatient Follow-up:  Please check CBC and chemistries in 1 week  Stop aspirin (after speaking with cardiology-now on Eliquis- Worsening anemia with some suspicion for occult GI bleeding Will require repeat chest x-ray and 6-8 weeks to document resolution of the pneumonia.  PRIMARY DISCHARGE DIAGNOSIS: Principal Problem:  Healthcare-associated pneumonia Active Problems:  History of rectal cancer  Ischemic cardiomyopathy-EF 35% 04/24/15 echo  Long term current use of anticoagulant therapy  Paroxysmal atrial fibrillation  Chronic kidney disease, stage IV (severe)  Dizziness  DM type 2, uncontrolled, with renal complications  GERD (gastroesophageal reflux disease)  Orthostatic hypotension  OSA on CPAP  Hyperlipidemia  Constipation  Anemia of chronic disease  Acute-on-chronic kidney injury  HCAP (healthcare-associated pneumonia)    PAST MEDICAL HISTORY: Past Medical History  Diagnosis Date  . Essential hypertension, benign   . CAD (coronary artery disease)     a. BMS to LAD 2001 at [facility] b. PTCA/atherectomy ramus and BMS to LAD 2009  . Chronic systolic heart failure   . GERD (gastroesophageal reflux disease)   . Paroxysmal atrial fibrillation     a. on amiodarone, digoxin and Eliquis  . Adenocarcinoma of rectum     a. 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 Prostate cancer     a. s/p seed implants with chemo and radiation  . TIA (transient ischemic attack)   . OSA on CPAP   . HLD (hyperlipidemia)   . Orthostatic hypotension   . Dizziness     a. chronic. Admission for this 07/18/2014  . History of falling July 2015    due to dizziness from medications  . Ischemic  cardiomyopathy     EF 18% by nuclear study 2016, multiple myocardial infarctions in past   . Chronic kidney disease, stage IV (severe) 12/11/2013  . DM type 2, uncontrolled, with renal complications 01/27/2014  . Obesity 12/12/2013  . Hematuria 03/2015  . HCAP (healthcare-associated pneumonia) 07/21/2015  . AICD (automatic cardioverter/defibrillator) present 2009    BiVentricular (upgrade)  . 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 History of blood transfusion     "I've had 2 units so far this year" (07/21/2015)  . Anemia     DISCHARGE MEDICATIONS: Current Discharge Medication List    START taking these medications   Details  amoxicillin-clavulanate (AUGMENTIN) 500-125 MG per tablet Take 1 tablet (500 mg total) by mouth every 12 (twelve) hours. Qty: 8 tablet, Refills: 0    ferrous sulfate 325 (65 FE) MG tablet Take 1 tablet (325 mg total) by mouth 2 (two) times daily with a meal. Qty: 60 tablet, Refills: 0    isosorbide-hydrALAZINE (BIDIL) 20-37.5 MG per tablet Take 0.5 tablets by mouth 3 (three) times daily. Qty: 120 tablet, Refills: 0      CONTINUE these medications which have CHANGED   Details  pantoprazole (PROTONIX) 40 MG tablet Take 1 tablet (40 mg total) by mouth daily. Qty: 30 tablet, Refills: 0      CONTINUE these medications which have NOT CHANGED   Details  apixaban (ELIQUIS) 5 MG TABS tablet Take 5 mg by mouth 2 (two) times daily.    co-enzyme Q-10 50 MG capsule Take 50 mg by mouth every morning.     fluticasone (FLONASE) 50 MCG/ACT nasal spray Place 1 spray into  both nostrils daily as needed for allergies.     furosemide (LASIX) 40 MG tablet Take 1 tablet (40 mg total) by mouth daily. Qty: 30 tablet, Refills: 11    glimepiride (AMARYL) 1 MG tablet Take 1 mg by mouth daily with breakfast.    Insulin Glargine (TOUJEO SOLOSTAR [place]) Inject 30 Units into the  skin every morning.    metoprolol succinate (TOPROL-XL) 25 MG 24 hr tablet Take 25-50 mg by mouth See admin instructions. Refills: 0    nitroGLYCERIN (NITROLINGUAL) 0.4 MG/SPRAY spray Place 1 spray under the tongue every 5 (five) minutes as needed. angina Qty: 12 g, Refills: 3    rosuvastatin (CRESTOR) 40 MG tablet Take 1 tablet (40 mg total) by mouth daily at 6 PM. Qty: 90 tablet, Refills: 11      STOP taking these medications     aspirin EC 81 MG tablet      isosorbide dinitrate (ISORDIL) 30 MG tablet         ALLERGIES: No Known Allergies  BRIEF HPI: See H&P, Labs, Consult and Test reports for all details in brief, patient was admitted for evaluation of lightheadedness and dizziness-he was found to have a hemoglobin of 7.0. He was subsequently admitted for further evaluation and treatment  CONSULTATIONS:  cardiology and GI  PERTINENT RADIOLOGIC STUDIES:  Imaging Results    Dg Chest 2 View  07/21/2015 CLINICAL DATA: Wheezing beginning last night. Initial encounter. EXAM: CHEST 2 VIEW COMPARISON: Single view of the chest 04/25/2015. PA and lateral chest 05/19/2014. FINDINGS: Heart size is mildly enlarged. Pacing device is in place and appears unchanged. Patchy airspace disease is identified in the right upper lobe. The left lung appears clear. No pneumothorax or pleural effusion. IMPRESSION: Patchy right upper lobe airspace disease most worrisome for pneumonia. Recommend followup to clearing. Electronically Signed By: Drusilla Kanner M.D. On: 07/21/2015 09:28      PERTINENT LAB RESULTS: CBC:  Recent Labs (last 2 labs)      Recent Labs  07/23/15 0318 07/24/15 0440  WBC 5.5 7.2  HGB 10.2* 10.7*  HCT 30.5* 32.6*  PLT 120* 142*     CMET CMP  Labs (Brief)       Component Value Date/Time   NA 137 07/24/2015 0440   K 4.4 07/24/2015 0440   CL 102 07/24/2015 0440   CO2 26 07/24/2015  0440   GLUCOSE 103* 07/24/2015 0440   BUN 41* 07/24/2015 0440   CREATININE 2.95* 07/24/2015 0440   CREATININE 2.20* 04/08/2015 1526   CALCIUM 9.3 07/24/2015 0440   CALCIUM 9.1 12/26/2014 0950   PROT 7.5 07/14/2015 1300   ALBUMIN 3.2* 07/14/2015 1300   AST 29 07/14/2015 1300   ALT 20 07/14/2015 1300   ALKPHOS 73 07/14/2015 1300   BILITOT 1.2 07/14/2015 1300   GFRNONAA 22* 07/24/2015 0440   GFRAA 25* 07/24/2015 0440      GFR Estimated Creatinine Clearance: 27.5 mL/min (by C-G formula based on Cr of 2.95).  Recent Labs (last 2 labs)     No results for input(s): LIPASE, AMYLASE in the last 72 hours.    Recent Labs (last 2 labs)     No results for input(s): CKTOTAL, CKMB, CKMBINDEX, TROPONINI in the last 72 hours.    Recent Labs (last 2 labs)     Invalid input(s): POCBNP    Recent Labs (last 2 labs)     No results for input(s): DDIMER in the last 72 hours.    Recent Labs (last 2  labs)     No results for input(s): HGBA1C in the last 72 hours.    Recent Labs (last 2 labs)     No results for input(s): CHOL, HDL, LDLCALC, TRIG, CHOLHDL, LDLDIRECT in the last 72 hours.    Recent Labs (last 2 labs)     No results for input(s): TSH, T4TOTAL, T3FREE, THYROIDAB in the last 72 hours.  Invalid input(s): FREET3    Recent Labs (last 2 labs)     No results for input(s): VITAMINB12, FOLATE, FERRITIN, TIBC, IRON, RETICCTPCT in the last 72 hours.   Coags:  Recent Labs (last 2 labs)     No results for input(s): INR in the last 72 hours.  Invalid input(s): PT   Microbiology: Recent Results (from the past 240 hour(s))  Culture, blood (routine x 2) Status: None (Preliminary result)   Collection Time: 07/21/15 4:29 PM  Result Value Ref Range Status   Specimen Description BLOOD RIGHT ARM  Final   Special Requests BOTTLES DRAWN AEROBIC AND ANAEROBIC 5CC  Final   Culture NO GROWTH 2 DAYS  Final    Report Status PENDING  Incomplete  Culture, blood (routine x 2) Status: None (Preliminary result)   Collection Time: 07/21/15 4:35 PM  Result Value Ref Range Status   Specimen Description BLOOD LEFT ARM  Final   Special Requests BOTTLES DRAWN AEROBIC AND ANAEROBIC 5CC  Final   Culture NO GROWTH 2 DAYS  Final   Report Status PENDING  Incomplete     BRIEF HOSPITAL COURSE:  Healthcare-associated pneumonia: Significantly improved, afebrile without leukocytosis. Blood cultures negative. Since clinically improved, we will transition to Augmentin on discharge. Will require repeat chest x-ray and 6-8 weeks to document resolution of the pneumonia.  Active Problems: Acute on chronic systolic heart failur (EF 35% 5/62/13 echo): Improved, transitioned to oral Lasix. Continue metoprolol and BiDil, not on ACEI given CKD. Patient to follow-up with CHF/cardiology clinic.  Suspected GI bleeding: Gastroenterology consulted-since recently had colonoscopy/EGD-felt repeat endoscopic studies would be low yield, subsequently underwent capsule endoscopy which showed mild gastritis, a small nonbleeding AVM, and a benign-appearing small bowel nodule. Recommendations are to restart Eliquis, and start iron supplementation.  Suspected subacute blood loss anemia with anemia of chronic disease at baseline: Seen by gastroenterology,underwent capsule endoscopy. Required 1 unit of PRBC so far, hemoglobin stable on discharge. Begin iron supplementation  Acute-on-chronic kidney injury stage 4:creatinine now close to usual baseline. Follow lytes.   History of paroxysmal atrial fibrillation: Evaluated by cards, recommendations are for rate control. Continue metoprolol, Resume Eliquis on discharge  History of CAD: No chest pain or shortness of breath.Given the fact that patient is on Eliquis-and has had unexplained anemia from suspected GI bleed-will discontinue aspirin-spoke with Dr.  Gala Romney. Continue beta blocker and statin.  Type 2 diabetes: CBGs stable-continue Lantus and Amaryl on discharge.   Dyslipidemia: Continue Crestor  Obstructive sleep apnea: Continue CPAP daily at bedtime  History of rectal cancer:s/p colectomy 2008 with colostomy. Colonoscopy 12/2014 normal.   H/o prostate cancer:- S/p chemo/radiation, last PSA in 5/16 was 0.09.  TODAY-DAY OF DISCHARGE:  Subjective:   [name] today has no headache,no chest abdominal pain,no new weakness tingling or numbness, feels much better wants to go home today.   Objective:   Blood pressure 88/63, pulse 74, temperature 98.1 F (36.7 C), temperature source Oral, resp. rate 20, height 5\' 10"  (1.778 m), weight 86.183 kg (190 lb), SpO2 99 %.  Intake/Output Summary (Last 24 hours) at 07/24/15 1330  Last data filed at 07/24/15 0900  Gross per 24 hour  Intake  660 ml  Output  1200 ml  Net  -540 ml   Filed Weights   07/22/15 0619 07/23/15 0500 07/23/15 0943  Weight: 90.266 kg (199 lb) 86.365 kg (190 lb 6.4 oz) 86.183 kg (190 lb)    Exam Awake Alert, Oriented *3, No new F.N deficits, Normal affect [place].AT,PERRAL Supple Neck,No JVD, No cervical lymphadenopathy appriciated.  Symmetrical Chest wall movement, Good air movement bilaterally, CTAB RRR,No Gallops,Rubs or new Murmurs, No Parasternal Heave +ve B.Sounds, Abd Soft, Non tender, No organomegaly appriciated, No rebound -guarding or rigidity. No Cyanosis, Clubbing or edema, No new Rash or bruise  DISCHARGE CONDITION: Stable  DISPOSITION: Home  DISCHARGE INSTRUCTIONS:  Activity:  As tolerated   Diet recommendation: Diabetic Diet Heart Healthy diet Fluid restriction 1.8 lit/day   Discharge Instructions    AMB Referral to [facility]  Complete by: As directed   Reason for consult: Recent hospitalizations; high risk referral prior to admission  Diagnoses of:  Heart Failure Kidney  Failure    Expected date of contact: 1-3 days (reserved for hospital discharges)  Please assign to community nurse for transition of care calls and assess for home visits. Referral was received prior to hospital admission. Consent signed. Questions please call:     Call MD for: difficulty breathing, headache or visual disturbances  Complete by: As directed      Call MD for: persistant nausea and vomiting  Complete by: As directed      Diet - low sodium heart healthy  Complete by: As directed      Diet Carb Modified  Complete by: As directed      Increase activity slowly  Complete by: As directed            Follow-up Information    Follow up with Cassell Smiles., MD. Schedule an appointment as soon as possible for a visit in 1 week.   Specialty: Internal Medicine   Contact information:   [address] [place] [address] [phone]       Follow up with Eula Listen, MD. Schedule an appointment as soon as possible for a visit in 2 weeks.   Specialty: Gastroenterology   Contact information:   [address] [place] [address] [phone]       Total Time spent on discharge equals 45 minutes.  SignedJeoffrey Massed 07/24/2015 1:30 PM        Revision History     Date/Time User Provider Type Action   07/24/2015 2:04 PM Paolo Okane Levora Dredge, MD Physician Addend   07/24/2015 1:34 PM Lily Kernen Levora Dredge, MD Physician Sign   View Details Report

## 2015-07-24 NOTE — Progress Notes (Signed)
Advanced Heart Failure Rounding Note   Subjective:   Admitted with increased cough, SOB, and dizziness. Blood cultures obtained. CXR concerning for RUL pneumonia. Started on fortaz and vanc. No BRBPR. No black stools in pouch. Hgb 7.0 --given 1UPRBCs.   Had capsule endoscopy 8/17   Denies SOB/Orthopnea. Wants to go home.   Objective:   Weight Range:  Vital Signs:   Temp:  [97.5 F (36.4 C)-98.1 F (36.7 C)] 98.1 F (36.7 C) (08/18 0012) Pulse Rate:  [74-106] 74 (08/17 2100) Resp:  [16-20] 20 (08/17 2100) BP: (82-92)/(61-67) 88/63 mmHg (08/18 0012) SpO2:  [99 %] 99 % (08/17 2100) Weight:  [190 lb (86.183 kg)] 190 lb (86.183 kg) (08/17 0943) Last BM Date: 07/22/15  Weight change: Filed Weights   07/22/15 0619 07/23/15 0500 07/23/15 0943  Weight: 199 lb (90.266 kg) 190 lb 6.4 oz (86.365 kg) 190 lb (86.183 kg)    Intake/Output:   Intake/Output Summary (Last 24 hours) at 07/24/15 0921 Last data filed at 07/24/15 0256  Gross per 24 hour  Intake    300 ml  Output   1050 ml  Net   -750 ml     Physical Exam: General:  Well appearing. No resp difficulty HEENT: normal Neck: supple. JVP ~10 . Carotids 2+ bilat; no bruits. No lymphadenopathy or thryomegaly appreciated. Cor: PMI nondisplaced. Regular rate & rhythm. No rubs, gallops or murmurs. Lungs: clear Abdomen: soft, nontender, nondistended. No hepatosplenomegaly. No bruits or masses. Good bowel sounds. LUQ colostomy  Extremities: no cyanosis, clubbing, rash, R and LLE SCDs.  Neuro: alert & orientedx3, cranial nerves grossly intact. moves all 4 extremities w/o difficulty. Affect pleasant  Telemetry: A fib V Paced   Labs: Basic Metabolic Panel:  Recent Labs Lab 07/21/15 0947 07/22/15 0337 07/23/15 0318 07/24/15 0440  NA 135 135 131* 137  K 4.7 3.6 3.6 4.4  CL 100* 100* 99* 102  CO2 23 22 22 26   GLUCOSE 91 73 104* 103*  BUN 55* 51* 44* 41*  CREATININE 3.14* 3.06* 2.89* 2.95*  CALCIUM 9.4 9.0 8.5* 9.3  MG  --   2.4  --   --     Liver Function Tests: No results for input(s): AST, ALT, ALKPHOS, BILITOT, PROT, ALBUMIN in the last 168 hours. No results for input(s): LIPASE, AMYLASE in the last 168 hours. No results for input(s): AMMONIA in the last 168 hours.  CBC:  Recent Labs Lab 07/21/15 0947 07/22/15 0337 07/23/15 0318  WBC 11.3* 7.9 5.5  NEUTROABS 8.4*  --   --   HGB 7.0* 11.0* 10.2*  HCT 20.6* 33.3* 30.5*  MCV 95.8 96.0 94.4  PLT 176 143* 120*    Cardiac Enzymes:  Recent Labs Lab 07/21/15 0947  TROPONINI <0.03    BNP: BNP (last 3 results)  Recent Labs  02/23/15 1001 04/23/15 0503 07/21/15 0947  BNP 568.0* 1218.9* 781.6*    ProBNP (last 3 results)  Recent Labs  09/27/14 0523 11/19/14 0745  PROBNP 9164.0* 5233.0*      Other results:  Imaging: No results found.   Medications:     Scheduled Medications: . aspirin EC  81 mg Oral Daily  . cefTAZidime (FORTAZ)  IV  2 g Intravenous Q24H  . docusate sodium  100 mg Oral BID  . furosemide  40 mg Oral Daily  . insulin aspart  0-9 Units Subcutaneous TID WC  . insulin glargine  20 Units Subcutaneous Daily  . isosorbide-hydrALAZINE  0.5 tablet Oral TID  . metoprolol  succinate  25 mg Oral QHS  . metoprolol succinate  50 mg Oral Daily  . pantoprazole  40 mg Oral Daily  . rosuvastatin  40 mg Oral q1800  . sodium chloride  3 mL Intravenous Q12H  . sodium chloride  3 mL Intravenous Q12H  . vancomycin  1,000 mg Intravenous Q24H    Infusions:    PRN Medications: sodium chloride, acetaminophen **OR** acetaminophen, fluticasone, nitroGLYCERIN, polyethylene glycol, sodium chloride   Assessment:  1. CAP 2. Anemia -- Hgb on admit 7.0  3. A/C Systolic Heart Failure - ICM  4. CKD Stage IV Creatinine baseline 2.8-3.2  5. DMII 6. CAD- ICM recent STEMI 2016. BMS LAD 2001 & 2009  7.H/O [facility] 12/2014  8. NSVT- Mag 2.4  9. Chronic A fib - Chads Vasc Score 4.  10 H/O TIA     Plan/Discussion:     Volume status mildly elevated. Give one dose IV lasix now. Start po lasix tomorrow.  Renal function ok. Continue current HF meds and restart lasix 40 mg daily. Renal function stable.    Capsule endoscopy results pending. Per GI. CBC pending. No evidence of bleeding.   Rate controlled. Off eliquis for now.   Could go home today Start lasix 40 mg daily  Bidil 1/2 tab three times a day Metoprolol 50 mg in am and 25 mg in pm  Will schedule follow in HF meeting.    Length of Stay: 3   CLEGG,AMY NP-C  07/24/2015, 9:21 AM  Advanced Heart Failure Team Pager [phone] (M-F; [place])  Please contact [facility] for night-coverage after hours (4p -7a ) and weekends on [email or web address]   Patient seen and examined with Darrick Grinder, NP. We discussed all aspects of the encounter. I agree with the assessment and plan as stated above.   Volume status much improved after IV lasix. Now euvolemic. Capsule with a few small AVMs. Can restart Eliquis. Stop ASA.   Kyandra Mcclaine,MD 2:01 PM

## 2015-07-24 NOTE — Progress Notes (Signed)
Patient ID: [name], male   DOB: 1955-12-14, 59 y.o.   MRN: [id number]  Capsule Endoscopy Findings (complete report to be scanned in):  Mild gastritis; a small nonbleeding AVM in the small bowel; benign-appearing small bowel nodule noted; questionable colon polyp seen which could be stool products.  Recs: Follow H/Hs as outpt. Iron supplementation. Repeat colonoscopy/EGD as outpt if anemia recurs.

## 2015-07-24 NOTE — Progress Notes (Signed)
Patient ID: [name], male   DOB: 20-Aug-1956, 59 y.o.   MRN: [id number] [facility] Gastroenterology Progress Note  [name] 59 y.o. 11/29/1956   Subjective: Feels ok. Wants to go home.  Objective: Vital signs in last 24 hours: Filed Vitals:   07/24/15 0012  BP: 88/63  Pulse: 74  Temp: 98.1 F (36.7 C)  Resp: 20    Physical Exam: Gen: alert, no acute distress CV: RRR Chest: CTA B Abd: soft, nontender, nondistended, +BS, colostomy intact Ext: no edema  Lab Results:  Recent Labs  07/22/15 0337 07/23/15 0318 07/24/15 0440  NA 135 131* 137  K 3.6 3.6 4.4  CL 100* 99* 102  CO2 22 22 26   GLUCOSE 73 104* 103*  BUN 51* 44* 41*  CREATININE 3.06* 2.89* 2.95*  CALCIUM 9.0 8.5* 9.3  MG 2.4  --   --    No results for input(s): AST, ALT, ALKPHOS, BILITOT, PROT, ALBUMIN in the last 72 hours.  Recent Labs  07/23/15 0318 07/24/15 0440  WBC 5.5 7.2  HGB 10.2* 10.7*  HCT 30.5* 32.6*  MCV 94.4 96.2  PLT 120* 142*   No results for input(s): LABPROT, INR in the last 72 hours.    Assessment/Plan: Capsule endoscopy unrevealing for a source of his anemia. No active bleeding. Ok to go home on anticoagulation per cards recs. Iron supplementation. May need a repeat colonoscopy/EGD if anemia continues to occur. F/U with Dr. Gala Romney as scheduled. Will sign off. Call if questions.   [place] C. 07/24/2015, 1:07 PM  Pager [phone]  If no answer or after 5 PM call [phone]

## 2015-07-24 NOTE — Progress Notes (Addendum)
PATIENT DETAILS Name: [name] Age: 59 y.o. Sex: male Date of Birth: 1956/04/09 MRN: [id number]. Admitting Physician: Modena Jansky, MD NWG:[id number],HYQMVHQI J., MD  Admit Date: 07/21/2015 Discharge date: 07/24/2015  Recommendations for Outpatient Follow-up:  Please check CBC and chemistries in 1 week  Stop aspirin (after speaking with cardiology-now on Eliquis- Worsening anemia with some suspicion for occult GI bleeding Will require repeat chest x-ray and 6-8 weeks to document resolution of the pneumonia.  PRIMARY DISCHARGE DIAGNOSIS:  Principal Problem:   Healthcare-associated pneumonia Active Problems:   History of rectal cancer   Ischemic cardiomyopathy-EF 35% 04/24/15 echo   Long term current use of anticoagulant therapy   Paroxysmal atrial fibrillation   Chronic kidney disease, stage IV (severe)   Dizziness   DM type 2, uncontrolled, with renal complications   GERD (gastroesophageal reflux disease)   Orthostatic hypotension   OSA on CPAP   Hyperlipidemia   Constipation   Anemia of chronic disease   Acute-on-chronic kidney injury   HCAP (healthcare-associated pneumonia)      PAST MEDICAL HISTORY: Past Medical History  Diagnosis Date  . Essential hypertension, benign   . CAD (coronary artery disease)     a. BMS to LAD 2001 at [facility] b. PTCA/atherectomy ramus and BMS to LAD 2009  . Chronic systolic heart failure   . GERD (gastroesophageal reflux disease)   . Paroxysmal atrial fibrillation     a. on amiodarone, digoxin and Eliquis  . Adenocarcinoma of rectum     a. 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 Prostate cancer     a. s/p seed implants with chemo and radiation  . TIA (transient ischemic attack)   . OSA on CPAP   . HLD (hyperlipidemia)   . Orthostatic hypotension   . Dizziness     a. chronic. Admission for this 07/18/2014  . History of falling July 2015    due to dizziness from medications  . Ischemic cardiomyopathy     EF 18% by nuclear study 2016, multiple  myocardial infarctions in past    . Chronic kidney disease, stage IV (severe) 12/11/2013  . DM type 2, uncontrolled, with renal complications 6/96/2952  . Obesity 12/12/2013  . Hematuria 03/2015  . HCAP (healthcare-associated pneumonia) 07/21/2015  . AICD (automatic cardioverter/defibrillator) present 2009    BiVentricular (upgrade)  . 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 History of blood transfusion     "I've had 2 units so far this year" (07/21/2015)  . Anemia     DISCHARGE MEDICATIONS: Current Discharge Medication List    START taking these medications   Details  amoxicillin-clavulanate (AUGMENTIN) 500-125 MG per tablet Take 1 tablet (500 mg total) by mouth every 12 (twelve) hours. Qty: 8 tablet, Refills: 0    ferrous sulfate 325 (65 FE) MG tablet Take 1 tablet (325 mg total) by mouth 2 (two) times daily with a meal. Qty: 60 tablet, Refills: 0    isosorbide-hydrALAZINE (BIDIL) 20-37.5 MG per tablet Take 0.5 tablets by mouth 3 (three) times daily. Qty: 120 tablet, Refills: 0      CONTINUE these medications which have CHANGED   Details  pantoprazole (PROTONIX) 40 MG tablet Take 1 tablet (40 mg total) by mouth daily. Qty: 30 tablet, Refills: 0      CONTINUE these medications which have NOT CHANGED   Details  apixaban (ELIQUIS) 5 MG TABS tablet Take 5 mg by mouth 2 (two) times daily.    co-enzyme Q-10 50 MG capsule  Take 50 mg by mouth every morning.     fluticasone (FLONASE) 50 MCG/ACT nasal spray Place 1 spray into both nostrils daily as needed for allergies.     furosemide (LASIX) 40 MG tablet Take 1 tablet (40 mg total) by mouth daily. Qty: 30 tablet, Refills: 11    glimepiride (AMARYL) 1 MG tablet Take 1 mg by mouth daily with breakfast.    Insulin Glargine (TOUJEO SOLOSTAR [place]) Inject 30 Units into the skin every morning.    metoprolol succinate (TOPROL-XL) 25 MG 24 hr tablet Take 25-50 mg by mouth See admin instructions. Refills: 0      nitroGLYCERIN (NITROLINGUAL) 0.4 MG/SPRAY spray Place 1 spray under the tongue every 5 (five) minutes as needed. angina Qty: 12 g, Refills: 3    rosuvastatin (CRESTOR) 40 MG tablet Take 1 tablet (40 mg total) by mouth daily at 6 PM. Qty: 90 tablet, Refills: 11      STOP taking these medications     aspirin EC 81 MG tablet      isosorbide dinitrate (ISORDIL) 30 MG tablet         ALLERGIES:  No Known Allergies  BRIEF HPI:  See H&P, Labs, Consult and Test reports for all details in brief, patient was admitted for evaluation of lightheadedness and dizziness-he was found to have a hemoglobin of 7.0. He was subsequently admitted for further evaluation and treatment  CONSULTATIONS:   cardiology and GI  PERTINENT RADIOLOGIC STUDIES: Dg Chest 2 View  07/21/2015   CLINICAL DATA:  Wheezing beginning last night.  Initial encounter.  EXAM: CHEST  2 VIEW  COMPARISON:  Single view of the chest 04/25/2015. PA and lateral chest 05/19/2014.  FINDINGS: Heart size is mildly enlarged. Pacing device is in place and appears unchanged. Patchy airspace disease is identified in the right upper lobe. The left lung appears clear. No pneumothorax or pleural effusion.  IMPRESSION: Patchy right upper lobe airspace disease most worrisome for pneumonia. Recommend followup to clearing.   Electronically Signed   By: Inge Rise M.D.   On: 07/21/2015 09:28     PERTINENT LAB RESULTS: CBC:  Recent Labs  07/23/15 0318 07/24/15 0440  WBC 5.5 7.2  HGB 10.2* 10.7*  HCT 30.5* 32.6*  PLT 120* 142*   CMET CMP     Component Value Date/Time   NA 137 07/24/2015 0440   K 4.4 07/24/2015 0440   CL 102 07/24/2015 0440   CO2 26 07/24/2015 0440   GLUCOSE 103* 07/24/2015 0440   BUN 41* 07/24/2015 0440   CREATININE 2.95* 07/24/2015 0440   CREATININE 2.20* 04/08/2015 1526   CALCIUM 9.3 07/24/2015 0440   CALCIUM 9.1 12/26/2014 0950   PROT 7.5 07/14/2015 1300   ALBUMIN 3.2* 07/14/2015 1300   AST 29 07/14/2015  1300   ALT 20 07/14/2015 1300   ALKPHOS 73 07/14/2015 1300   BILITOT 1.2 07/14/2015 1300   GFRNONAA 22* 07/24/2015 0440   GFRAA 25* 07/24/2015 0440    GFR Estimated Creatinine Clearance: 27.5 mL/min (by C-G formula based on Cr of 2.95). No results for input(s): LIPASE, AMYLASE in the last 72 hours. No results for input(s): CKTOTAL, CKMB, CKMBINDEX, TROPONINI in the last 72 hours. Invalid input(s): POCBNP No results for input(s): DDIMER in the last 72 hours. No results for input(s): HGBA1C in the last 72 hours. No results for input(s): CHOL, HDL, LDLCALC, TRIG, CHOLHDL, LDLDIRECT in the last 72 hours. No results for input(s): TSH, T4TOTAL, T3FREE, THYROIDAB in the last 72  hours.  Invalid input(s): FREET3 No results for input(s): VITAMINB12, FOLATE, FERRITIN, TIBC, IRON, RETICCTPCT in the last 72 hours. Coags: No results for input(s): INR in the last 72 hours.  Invalid input(s): PT Microbiology: Recent Results (from the past 240 hour(s))  Culture, blood (routine x 2)     Status: None (Preliminary result)   Collection Time: 07/21/15  4:29 PM  Result Value Ref Range Status   Specimen Description BLOOD RIGHT ARM  Final   Special Requests BOTTLES DRAWN AEROBIC AND ANAEROBIC 5CC  Final   Culture NO GROWTH 2 DAYS  Final   Report Status PENDING  Incomplete  Culture, blood (routine x 2)     Status: None (Preliminary result)   Collection Time: 07/21/15  4:35 PM  Result Value Ref Range Status   Specimen Description BLOOD LEFT ARM  Final   Special Requests BOTTLES DRAWN AEROBIC AND ANAEROBIC 5CC  Final   Culture NO GROWTH 2 DAYS  Final   Report Status PENDING  Incomplete     BRIEF HOSPITAL COURSE:  Healthcare-associated pneumonia: Significantly improved, afebrile without leukocytosis. Blood cultures negative. Since clinically improved, we will transition to Augmentin on discharge.  Will require repeat chest x-ray and 6-8 weeks to document resolution of the pneumonia.  Active  Problems: Acute on chronic systolic heart failur (EF 35% 04/24/15 echo): Improved, transitioned to oral Lasix. Continue metoprolol and BiDil, not on ACEI given CKD. Patient to follow-up with CHF/cardiology clinic.  Suspected GI bleeding: Gastroenterology consulted-since recently had colonoscopy/EGD-felt repeat endoscopic studies would be low yield, subsequently underwent capsule endoscopy which showed mild gastritis, a small nonbleeding AVM, and a benign-appearing small bowel nodule. Recommendations are to restart Eliquis, and start iron supplementation.  Suspected subacute blood loss anemia with anemia of chronic disease at baseline: Seen by gastroenterology,underwent capsule endoscopy. Required 1 unit of PRBC so far, hemoglobin stable on discharge. Begin iron supplementation  Acute-on-chronic kidney injury stage 4:creatinine now close to usual baseline. Follow lytes.   History of paroxysmal atrial fibrillation: Evaluated by cards, recommendations are for rate control. Continue metoprolol, Resume Eliquis on discharge  History of CAD: No chest pain or shortness of breath.Given the fact that patient is on Eliquis-and has had unexplained anemia from suspected GI bleed-will discontinue aspirin-spoke with Dr. Haroldine Laws. Continue beta blocker and statin.  Type 2 diabetes: CBGs stable-continue  Lantus and Amaryl on discharge.   Dyslipidemia: Continue Crestor  Obstructive sleep apnea: Continue CPAP daily at bedtime  History of rectal cancer:s/p colectomy 2008 with colostomy. Colonoscopy 12/2014 normal.   H/o prostate cancer:- S/p chemo/radiation, last PSA in 5/16 was 0.09.  TODAY-DAY OF DISCHARGE:  Subjective:   [name] today has no headache,no chest abdominal pain,no new weakness tingling or numbness, feels much better wants to go home today.   Objective:   Blood pressure 88/63, pulse 74, temperature 98.1 F (36.7 C), temperature source Oral, resp. rate 20, height 5\' 10"  (1.778 m),  weight 86.183 kg (190 lb), SpO2 99 %.  Intake/Output Summary (Last 24 hours) at 07/24/15 1330 Last data filed at 07/24/15 0900  Gross per 24 hour  Intake    660 ml  Output   1200 ml  Net   -540 ml   Filed Weights   07/22/15 0619 07/23/15 0500 07/23/15 0943  Weight: 90.266 kg (199 lb) 86.365 kg (190 lb 6.4 oz) 86.183 kg (190 lb)    Exam Awake Alert, Oriented *3, No new F.N deficits, Normal affect [place].AT,PERRAL Supple Neck,No JVD, No cervical lymphadenopathy appriciated.  Symmetrical Chest wall movement, Good air movement bilaterally, CTAB RRR,No Gallops,Rubs or new Murmurs, No Parasternal Heave +ve B.Sounds, Abd Soft, Non tender, No organomegaly appriciated, No rebound -guarding or rigidity. No Cyanosis, Clubbing or edema, No new Rash or bruise  DISCHARGE CONDITION: Stable  DISPOSITION: Home  DISCHARGE INSTRUCTIONS:    Activity:  As tolerated   Diet recommendation: Diabetic Diet Heart Healthy diet Fluid restriction 1.8 lit/day   Discharge Instructions    AMB Referral to [facility]    Complete by:  As directed   Reason for consult:  Recent hospitalizations; high risk referral prior to admission  Diagnoses of:   Heart Failure Kidney Failure    Expected date of contact:  1-3 days (reserved for hospital discharges)  Please assign to community nurse for transition of care calls and assess for home visits.  Referral was received prior to hospital admission. Consent signed.  Questions please call:     Call MD for:  difficulty breathing, headache or visual disturbances    Complete by:  As directed      Call MD for:  persistant nausea and vomiting    Complete by:  As directed      Diet - low sodium heart healthy    Complete by:  As directed      Diet Carb Modified    Complete by:  As directed      Increase activity slowly    Complete by:  As directed            Follow-up Information    Follow up with Glo Herring., MD. Schedule an appointment as soon  as possible for a visit in 1 week.   Specialty:  Internal Medicine   Contact information:   [address] [place] [address] [phone]       Follow up with Manus Rudd, MD. Schedule an appointment as soon as possible for a visit in 2 weeks.   Specialty:  Gastroenterology   Contact information:   [address] [place] [address] [phone]       Total Time spent on discharge equals  45 minutes.  SignedOren Binet 07/24/2015 1:30 PM

## 2015-07-24 NOTE — Progress Notes (Signed)
Pt a/o, pt being dc to home with wife, dc instructions given to pt, pt verbalized understanding, VSS, pt stable

## 2015-07-25 ENCOUNTER — Other Ambulatory Visit: Payer: 59 | Admitting: *Deleted

## 2015-07-25 LAB — TYPE AND SCREEN
ABO/RH(D): O POS
ANTIBODY SCREEN: NEGATIVE
UNIT DIVISION: 0
Unit division: 0
Unit division: 0

## 2015-07-25 NOTE — Patient Outreach (Signed)
TOC week #1 attempted Call to patient number given. No answer, left Voice mail with RNCM contact. Plan await call back from patient this week, if none Will attempt to contact again next week. [name]. [name], RN, BSN, [place] [phone]

## 2015-07-26 LAB — CULTURE, BLOOD (ROUTINE X 2)
Culture: NO GROWTH
Culture: NO GROWTH

## 2015-07-28 ENCOUNTER — Encounter: Payer: Self-pay | Admitting: Internal Medicine

## 2015-07-28 ENCOUNTER — Other Ambulatory Visit: Payer: Self-pay | Admitting: *Deleted

## 2015-07-28 DIAGNOSIS — I5022 Chronic systolic (congestive) heart failure: Secondary | ICD-10-CM | POA: Diagnosis not present

## 2015-07-28 NOTE — Patient Outreach (Signed)
[facility] program TOC call Week #2 Call attempted to patient on both numbers listed. Unable to reach. Left Voicemail with RNCM contact. Plan to await call back from patient. If no return call will attempt again next week. [name]. [name], RN, BSN, [place] [phone]

## 2015-07-29 ENCOUNTER — Ambulatory Visit (INDEPENDENT_AMBULATORY_CARE_PROVIDER_SITE_OTHER): Payer: Medicare Other | Admitting: Cardiology

## 2015-07-29 ENCOUNTER — Encounter: Payer: Self-pay | Admitting: Cardiology

## 2015-07-29 VITALS — BP 96/66 | HR 85 | Ht 70.0 in | Wt 202.0 lb

## 2015-07-29 DIAGNOSIS — C61 Malignant neoplasm of prostate: Secondary | ICD-10-CM | POA: Diagnosis not present

## 2015-07-29 DIAGNOSIS — I5022 Chronic systolic (congestive) heart failure: Secondary | ICD-10-CM | POA: Diagnosis not present

## 2015-07-29 DIAGNOSIS — I4891 Unspecified atrial fibrillation: Secondary | ICD-10-CM

## 2015-07-29 DIAGNOSIS — I255 Ischemic cardiomyopathy: Secondary | ICD-10-CM | POA: Diagnosis not present

## 2015-07-29 DIAGNOSIS — E785 Hyperlipidemia, unspecified: Secondary | ICD-10-CM

## 2015-07-29 DIAGNOSIS — I2510 Atherosclerotic heart disease of native coronary artery without angina pectoris: Secondary | ICD-10-CM | POA: Diagnosis not present

## 2015-07-29 NOTE — Patient Instructions (Signed)
Your physician wants you to follow-up in: [place] [address] BRANCH You will receive a reminder letter in the mail two months in advance. If you don't receive a letter, please call our office to schedule the follow-up appointment.  Your physician has recommended you make the following change in your medication:   STOP ASPIRIN   WE WILL REQUEST LABS  Thank you for choosing [facility]!!

## 2015-07-29 NOTE — Progress Notes (Signed)
Patient ID: DARROW BARREIRO, male   DOB: [birth year], 59 y.o.   MRN: [id number]     Clinical Summary Mr. Elwood is a 59 y.o.male seen today for follow up of the following medical problems.   1. CAD/ICM  - prior BMS to LAD in 2001, repeat BMS to LAD in 2009.  - echo 04/2015 LVEF 35% - he has BiV AICD placed by Dr Caryl Comes, followed by Dr Lovena Le. Generator change 04/10/15.  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  - last cath 07/2013 as reported below, no culprit vessels, managed medically - he is followed in [place] and our [facility] CHF clinic as well for CHF - medical therapy has been scaled back previously due to problems with orthostatic dizziness and falls. Patient with previous fall resulting in subarachnoid hemorrhage.  -  admit 04/2015 with acute on chronic systolic HF, also with NSTEMI and troponin of 4.29. Due to poor renal function a Lexiscan MPI was obtained which showed prior infarct without significant current ischemia.    - Notes some mild LE edema. No orthopnea. Weights at home are stable at home around 197. - denies any orthostatic dizziness   2. Pneumonia - recent admit 8/2/016 with HCAP, discharged 07/24/15 - still has 2 more days of oral abx. Still with some SOB. Still with some cough.    2. Afib/Aflutter  - denies any palpitations  - compliant with eliquis.  - has been followed by GI for anemia, has had EGD/colonscopy/pill enodscopy. No clear source of bleeding, he has continued on eliquis. He did require 1 unit of pRBCs during most admit earlier this month.  - denies any palpitations  3. Hyperlipidemia  - compliant with statin   4. Bilateral carotid stenosis - 01/2014 carotid US <50% bilateral - no neuro symptoms  5. CKD - followed by Dr Hinda Lenis - most recent Cr 04/26/15 was 2.13, BUN 30, GFR 32  6. OSA - compliant with CPAP     Past Medical  History  Diagnosis Date  . Essential hypertension, benign   . CAD (coronary artery disease)     a. BMS to LAD 2001 at [facility] b. PTCA/atherectomy ramus and BMS to LAD 2009  . Chronic systolic heart failure   . GERD (gastroesophageal reflux disease)   . Paroxysmal atrial fibrillation     a. on amiodarone, digoxin and Eliquis  . Adenocarcinoma of rectum     a. 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 Prostate cancer     a. s/p seed implants with chemo and radiation  . TIA (transient ischemic attack)   . OSA on CPAP   . HLD (hyperlipidemia)   . Orthostatic hypotension   . Dizziness     a. chronic. Admission for this 07/18/2014  . History of falling July 2015    due to dizziness from medications  . Ischemic cardiomyopathy     EF 18% by nuclear study 2016, multiple myocardial infarctions in past    . Chronic kidney disease, stage IV (severe) 12/11/2013  . DM type 2, uncontrolled, with renal complications 03/14/8118  . Obesity 12/12/2013  . Hematuria 03/2015  . HCAP (healthcare-associated pneumonia) 07/21/2015  . AICD (automatic cardioverter/defibrillator) present 2009    BiVentricular (upgrade)  . 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 History of blood transfusion     "I've had 2 units so far this year" (07/21/2015)  . Anemia  No Known Allergies   Current Outpatient Prescriptions  Medication Sig Dispense Refill  . amoxicillin-clavulanate (AUGMENTIN) 500-125 MG per tablet Take 1 tablet (500 mg total) by mouth every 12 (twelve) hours. 8 tablet 0  . apixaban (ELIQUIS) 5 MG TABS tablet Take 5 mg by mouth 2 (two) times daily.    Marland Kitchen co-enzyme Q-10 50 MG capsule Take 50 mg by mouth every morning.     . ferrous sulfate 325 (65 FE) MG tablet Take 1 tablet (325 mg total) by mouth 2 (two) times daily with a meal. 60 tablet 0  . fluticasone (FLONASE) 50 MCG/ACT nasal spray Place 1 spray into both nostrils daily as needed for allergies.     . furosemide (LASIX) 40 MG tablet Take 1 tablet (40  mg total) by mouth daily. 30 tablet 11  . glimepiride (AMARYL) 1 MG tablet Take 1 mg by mouth daily with breakfast.    . Insulin Glargine (TOUJEO SOLOSTAR [place]) Inject 30 Units into the skin every morning.    . isosorbide-hydrALAZINE (BIDIL) 20-37.5 MG per tablet Take 0.5 tablets by mouth 3 (three) times daily. 120 tablet 0  . metoprolol succinate (TOPROL-XL) 25 MG 24 hr tablet Take 25-50 mg by mouth See admin instructions.  0  . nitroGLYCERIN (NITROLINGUAL) 0.4 MG/SPRAY spray Place 1 spray under the tongue every 5 (five) minutes as needed. angina 12 g 3  . pantoprazole (PROTONIX) 40 MG tablet Take 1 tablet (40 mg total) by mouth daily. 30 tablet 0  . rosuvastatin (CRESTOR) 40 MG tablet Take 1 tablet (40 mg total) by mouth daily at 6 PM. 90 tablet 11   No current facility-administered medications for this visit.     Past Surgical History  Procedure Laterality Date  . Internal defibrillator and pacemaker  2002  . Abdominal and perineal resection of rectum with total mesorectal excision  10/04/2007  . Colonoscopy  09/14/2011    Dr. Gala Romney: via colostomy, Single pedunculated benign inflammatory polyp. Due for surveillance Oct 2015  . Colostomy  2008  . Colonoscopy N/A 07/02/2014    Procedure: COLONOSCOPY;  Surgeon: Daneil Dolin, MD;  Location: [facility];  Service: Endoscopy;  Laterality: N/A;  7:30 / COLONOSCOPY THRU COLOSTOMY  . Esophagogastroduodenoscopy N/A 07/02/2014    Procedure: ESOPHAGOGASTRODUODENOSCOPY (EGD);  Surgeon: Daneil Dolin, MD;  Location: [facility];  Service: Endoscopy;  Laterality: N/A;  7:30  . Savory dilation N/A 07/02/2014    Procedure: SAVORY DILATION;  Surgeon: Daneil Dolin, MD;  Location: [facility];  Service: Endoscopy;  Laterality: N/A;  7:30  . Maloney dilation N/A 07/02/2014    Procedure: Venia Minks DILATION;  Surgeon: Daneil Dolin, MD;  Location: [facility];  Service: Endoscopy;  Laterality: N/A;  7:30  . Portacath placement  06/2007    "removed ~ 1  yr later"  . Left heart catheterization with coronary angiogram N/A 07/13/2013    Procedure: LEFT HEART CATHETERIZATION WITH CORONARY ANGIOGRAM;  Surgeon: Lorretta Harp, MD;  Location[facility] CATH LAB;  Service: Cardiovascular;  Laterality: N/A;  . Colonoscopy N/A 12/11/2014    Dr. Gala Romney via colostomy. Normal. Repeat in 2021.   Marland Kitchen Esophagogastroduodenoscopy N/A 12/11/2014    [id number] EGD  . Cardiac defibrillator placement  2009    Upgraded to a BiV ICD  . Right heart catheterization N/A 02/24/2015    Procedure: RIGHT HEART CATH;  Surgeon: Jolaine Artist, MD;  Location: [facility] CATH LAB;  Service: Cardiovascular;  Laterality: N/A;  .  Ep implantable device N/A 04/10/2015    Procedure: Ppm/Biv Ppm Generator Changeout;  Surgeon: Evans Lance, MD;  Location: [place] INVASIVE CV LAB CUPID;  Service: Cardiovascular;  Laterality: N/A;  . Bi-ventricular implantable cardioverter defibrillator  (crt-d)  2009  . Cardiac catheterization  08/2001; 2009    ; /notes 07/10/2013  . Coronary angioplasty with stent placement  2001; ~ 2006    "1 + 1"   . Givens capsule study N/A 07/23/2015    Procedure: GIVENS CAPSULE STUDY;  Surgeon: Wilford Corner, MD;  Location: [facility] ENDOSCOPY;  Service: Endoscopy;  Laterality: N/A;     No Known Allergies    Family History  Problem Relation Age of Onset  . Colon cancer Mother 25  . Colon cancer Sister 68  . Coronary artery disease Father   . Colon cancer Other     2 cousins, succumbed to illness     Social History Mr. Fadeley reports that he has never smoked. He has never used smokeless tobacco. Mr. Hoge reports that he does not drink alcohol.   Review of Systems CONSTITUTIONAL: No weight loss, fever, chills, weakness or fatigue.  HEENT: Eyes: No visual loss, blurred vision, double vision or yellow sclerae.No hearing loss, sneezing, congestion, runny nose or sore throat.  SKIN: No rash or itching.  CARDIOVASCULAR: per HPI RESPIRATORY: No shortness of breath, cough or  sputum.  GASTROINTESTINAL: No anorexia, nausea, vomiting or diarrhea. No abdominal pain or blood.  GENITOURINARY: No burning on urination, no polyuria NEUROLOGICAL: No headache, dizziness, syncope, paralysis, ataxia, numbness or tingling in the extremities. No change in bowel or bladder control.  MUSCULOSKELETAL: No muscle, back pain, joint pain or stiffness.  LYMPHATICS: No enlarged nodes. No history of splenectomy.  PSYCHIATRIC: No history of depression or anxiety.  ENDOCRINOLOGIC: No reports of sweating, cold or heat intolerance. No polyuria or polydipsia.  Marland Kitchen   Physical Examination Filed Vitals:   07/29/15 0814  BP: 96/66  Pulse: 85   Filed Vitals:   07/29/15 0814  Height: 5\' 10"  (1.778 m)  Weight: 202 lb (91.627 kg)    Gen: resting comfortably, no acute distress HEENT: no scleral icterus, pupils equal round and reactive, no palptable cervical adenopathy,  CV: RRR, no m/r/g, no JVD, no carotid bruits Resp: Clear to auscultation bilaterally GI: abdomen is soft, non-tender, non-distended, normal bowel sounds, no hepatosplenomegaly MSK: extremities are warm, no edema.  Skin: warm, no rash Neuro:  no focal deficits Psych: appropriate affect   Diagnostic Studies 08/2013 Echo  LVEF 30-35%, + WMAs, grade I diastolic dysfunction, mild MR,   07/2013 Cath  HEMODYNAMICS:   AO SYSTOLIC/AO DIASTOLIC: 308/65  ANGIOGRAPHIC RESULTS:   1. Left main; normal  2. LAD; the entire proximal third of the LAD was fluoroscopically calcified. The proximal third had approximately 50-60% segmental stenosis. The stent was widely patent in the proximal third of the LAD with 30-40% in-stent restenosis". There was a moderate size first diagonal branch and the ramus distribution that was occluded in its proximal portion and filled by collaterals. This was noted to be highly diseased at his last cath in 2009. There was 50-60% segmental stenosis in the middle third and 70% in the distal/apical  third. There were 2 small marginal branches arising from the middle third that had 90% ostial stenoses unchanged from prior cath 3. Left circumflex; dominant with 99% long segmental proximal OM1 stenosis in a small to medium-size vessel unchanged from prior cath 4. Right coronary artery; nondominant with 50% mid  and 80% distal stenosis unchanged from prior cath  5. 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 The only change in his anatomy from 5 years ago was occlusion of the high first diagonal branch which was severely diffusely diseased previously. There are no "culprit vessels. The patient is already on maximal medical therapy. Plans will be continued medical therapy. The sheath was removed and a TR Band was placed on the right wrist to achieve patent hemostasis. The patient left the Cath Lab in stable condition. He'll be gently hydrated and Coumadin will be restarted.   02/2014 Echo  Study Conclusions  - Procedure narrative: Transthoracic echocardiography. Image quality was suboptimal. The study was technically difficult, as a result of poor sound wave transmission. - Left ventricle: Systolic function is severely reduced, estimated EF 15-20%. Severe diffuse hypokinesis is noted. The cavity size was moderately dilated. Wall thickness was increased in a pattern of moderate LVH. Diastolic dysfunction is seen, indeterminate grade. The apex was poorly visualized. Doppler parameters are consistent with high ventricular filling pressure. - Ventricular septum: Septal motion showed abnormal function and dyssynergy. These changes are consistent with right ventricular pacing. - Aortic valve: Trileaflet; mildly thickened leaflets. There was no stenosis. - Mitral valve: Mildly thickened leaflets . Mild tethering of leaflet motion due to severe left  ventricular dysfunction. Mild regurgitation. - Left atrium: The atrium was mildly to moderately dilated. - Right ventricle: The cavity size was normal. Wall thickness was normal. Pacer wire or catheter noted in right ventricle. Systolic function was mildly to moderately reduced. - Right atrium: Pacer wire or catheter noted in right atrium. - Atrial septum: The septum bowed from left to right, consistent with increased left atrial pressure. - Tricuspid valve: Mild regurgitation. - Pulmonary arteries: PA peak pressure: 25mm Hg (S). Mildly elevated pulmonary pressures.   09/2014 Echo Study Conclusions  - Left ventricle: The cavity size was mildly dilated. Wall thickness was increased in a pattern of moderate LVH. Diastolic function is abnormal, indeterminant grade. Systolic function was moderately to severely reduced. The estimated ejection fraction was in the range of 30% to 35%. Evaluation of LVEF is limited by limited visualization of the endocardium, consider contrast study for more accurate evaluation. - Aorta: The visualized portion of the proximal ascending aorta is mildly dilated measuring 3.8 cm. - Aortic root: The aortic root was normal in size. - Left atrium: The atrium was moderately dilated. - Right ventricle: Not well visualized. Grossly appears moderately enlarged with low normal systolic function. - Right atrium: Not well visualized. Grossly appears moderately enlarged. - Pulmonary arteries: PA peak pressure: 35 mm Hg (S). PASP is borderline elevated. - Inferior vena cava: The vessel was normal in size. The respirophasic diameter changes were in the normal range (>= 50%), consistent with normal central venous pressure. - Technically difficult study.   04/2015 Lexiscan  Findings consistent with ischemia.  This is a high risk study.  The left ventricular ejection fraction is severely decreased (<30%).  Defect 1: There is a small  defect of moderate severity present in the mid anterior and mid anteroseptal location.  Defect 2: There is a large defect present in the apical anterior, apical septal, apical inferior and apex location.  04/2015 Echo Study Conclusions  - Left ventricle: POor acoustic windows limit study even with Definity use . LVEF is approximately is apprxoiamtely 35% with hypokinesis/akinesis of distal 1/3 of LV.  The cavity size was severely dilated. Systolic function was normal. Wall motion was normal; there were no regional wall motion abnormalities. - Aortic valve: There was trivial regurgitation. - Mitral valve: There was mild regurgitation. - Left atrium: The atrium was severely dilated. - Right ventricle: Systolic function was mildly reduced. - Pulmonary arteries: PA peak pressure: 55 mm Hg (S).    Assessment and Plan   1.CAD/ICM/Chronic systolic HF - LVEF 59-74% by echo 09/2014, NYHA III, he has a BiV AICD  - appears euvolemic today  - medical therapy had been limited due to low blood pressures, dizziness, and falls including a recent fall with a subarachonid hemorrhage. Currently denies any dizziness or orthostatic symptoms.  - continue current meds  2. Hyperlipidemia  - continue high dose statin in setting of know CAD.   3. Afib /aflutter  - no current symptoms, continue current meds    4. OSA - compliant with CPAP  5. CKD - followed by Dr Hinda Lenis    F/u 4 months. He will continue to follow up with CHF clinic more freuquently.     Arnoldo Lenis, M.D.

## 2015-07-30 ENCOUNTER — Other Ambulatory Visit (HOSPITAL_COMMUNITY): Payer: Medicare Other

## 2015-07-30 ENCOUNTER — Other Ambulatory Visit: Payer: Self-pay | Admitting: *Deleted

## 2015-07-30 DIAGNOSIS — Z85048 Personal history of other malignant neoplasm of rectum, rectosigmoid junction, and anus: Secondary | ICD-10-CM

## 2015-07-31 ENCOUNTER — Other Ambulatory Visit: Payer: Self-pay | Admitting: *Deleted

## 2015-07-31 ENCOUNTER — Encounter: Payer: Medicare Other | Admitting: Internal Medicine

## 2015-07-31 ENCOUNTER — Ambulatory Visit (HOSPITAL_COMMUNITY)
Admission: RE | Admit: 2015-07-31 | Discharge: 2015-07-31 | Disposition: A | Discharge status: Home / Self Care | Payer: Medicare Other | Source: Ambulatory Visit | Attending: Oncology | Admitting: Oncology

## 2015-07-31 ENCOUNTER — Ambulatory Visit (HOSPITAL_COMMUNITY): Payer: Medicare Other

## 2015-07-31 DIAGNOSIS — Z85048 Personal history of other malignant neoplasm of rectum, rectosigmoid junction, and anus: Secondary | ICD-10-CM

## 2015-07-31 DIAGNOSIS — Z923 Personal history of irradiation: Secondary | ICD-10-CM | POA: Insufficient documentation

## 2015-07-31 DIAGNOSIS — Z9221 Personal history of antineoplastic chemotherapy: Secondary | ICD-10-CM | POA: Diagnosis not present

## 2015-07-31 DIAGNOSIS — Z8546 Personal history of malignant neoplasm of prostate: Secondary | ICD-10-CM | POA: Insufficient documentation

## 2015-07-31 DIAGNOSIS — Z08 Encounter for follow-up examination after completed treatment for malignant neoplasm: Secondary | ICD-10-CM | POA: Diagnosis not present

## 2015-07-31 NOTE — Patient Outreach (Signed)
TOC week #2 outreach attempted Call to patient number, no answer, RNCM left Voicemail with contact information Plan await return call If no call will attempt again next week. [name]. [name], RN, BSN, [place] [phone]

## 2015-08-01 ENCOUNTER — Encounter: Payer: Self-pay | Admitting: Nurse Practitioner

## 2015-08-01 ENCOUNTER — Ambulatory Visit (INDEPENDENT_AMBULATORY_CARE_PROVIDER_SITE_OTHER): Payer: Medicare Other | Admitting: Nurse Practitioner

## 2015-08-01 VITALS — BP 84/55 | HR 121 | Temp 98.4°F | Ht 70.0 in | Wt 204.4 lb

## 2015-08-01 DIAGNOSIS — D649 Anemia, unspecified: Secondary | ICD-10-CM

## 2015-08-01 DIAGNOSIS — K5909 Other constipation: Secondary | ICD-10-CM | POA: Diagnosis not present

## 2015-08-01 DIAGNOSIS — R7401 Elevation of levels of liver transaminase levels: Secondary | ICD-10-CM

## 2015-08-01 DIAGNOSIS — R740 Nonspecific elevation of levels of transaminase and lactic acid dehydrogenase [LDH]: Secondary | ICD-10-CM | POA: Diagnosis not present

## 2015-08-01 DIAGNOSIS — I255 Ischemic cardiomyopathy: Secondary | ICD-10-CM | POA: Diagnosis not present

## 2015-08-01 DIAGNOSIS — R1011 Right upper quadrant pain: Secondary | ICD-10-CM | POA: Diagnosis not present

## 2015-08-01 LAB — CBC WITH DIFFERENTIAL/PLATELET
BASOS ABS: 0 10*3/uL (ref 0.0–0.1)
Basophils Relative: 0 % (ref 0–1)
EOS ABS: 0.2 10*3/uL (ref 0.0–0.7)
Eosinophils Relative: 3 % (ref 0–5)
HEMATOCRIT: 30.6 % — AB (ref 39.0–52.0)
HEMOGLOBIN: 10 g/dL — AB (ref 13.0–17.0)
LYMPHS ABS: 1.1 10*3/uL (ref 0.7–4.0)
LYMPHS PCT: 19 % (ref 12–46)
MCH: 31.8 pg (ref 26.0–34.0)
MCHC: 32.7 g/dL (ref 30.0–36.0)
MCV: 97.5 fL (ref 78.0–100.0)
MONOS PCT: 7 % (ref 3–12)
MPV: 10.1 fL (ref 8.6–12.4)
Monocytes Absolute: 0.4 10*3/uL (ref 0.1–1.0)
NEUTROS ABS: 4.3 10*3/uL (ref 1.7–7.7)
NEUTROS PCT: 71 % (ref 43–77)
Platelets: 170 10*3/uL (ref 150–400)
RBC: 3.14 MIL/uL — ABNORMAL LOW (ref 4.22–5.81)
RDW: 16.4 % — ABNORMAL HIGH (ref 11.5–15.5)
WBC: 6 10*3/uL (ref 4.0–10.5)

## 2015-08-01 NOTE — Patient Instructions (Signed)
1. Have your labs drawn when you're able to. 2. Return follow-up in 6-8 weeks. 3. Call your heart failure team to discuss her shortness of breath symptoms 4. Return for follow-up in 6-8 weeks.

## 2015-08-01 NOTE — Progress Notes (Signed)
Referring Provider: Redmond School, MD Primary Care Physician:  Glo Herring., MD Primary GI:  Dr. Gala Romney  Chief Complaint  Patient presents with  . Follow-up    HPI:   59 year old male presents for follow-up on right upper quadrant pain, constipation, and transaminitis. He was admitted the hospital earlier this year with jaundice and gallstones deemed likely transient obstructive cholangitis with the gallstone subsequently passed. At his last visit on 05/01/2015 his constipation and abdominal pain had subsided. His follow-up liver labs all values didn't back to normal except for bilirubin which was mildly elevated however continued steady trend downward. Recommended 3 month follow-up to recheck labs.  Today he states he was recently in the hospital for exacerbation of heart failure. Was noted to be anemic on Eloquis. Hgb as low as 7's in the hospital and he received blood transfusion. Colonoscopy and endoscopy 12/11/14. Colonoscopy found normal colon mucosa. EGD was normal. Recommend repeat TCS 5 years and continue Protonix for GERD. As inpatient was given capsule study due to profound anemia which found single AVM, gastritis, benign appearing small bowel nodule, questionable colon polyp. Denies abdominal pain, N/V. Occasional constipation improves with prn OTC laxative with good results. Has had a single episode of hematochezia which he feels came from his very sensitive stoma (he had had some diarrhea which required excessive cleaning around the site.) All other times no hematochezia. Denies melena. As inpatient he was heme negative (per the patient.)  Denies jaundice and scleral icterus. Denies chest pain, dizziness, lightheadedness, syncope, near syncope. He is having some dyspnea on exertion and will call heart failure team today to possibly have his appointment bumped up. Denies any other upper or lower GI symptoms.  Past Medical History  Diagnosis Date  . Essential hypertension,  benign   . CAD (coronary artery disease)     a. BMS to LAD 2001 at [facility] b. PTCA/atherectomy ramus and BMS to LAD 2009  . Chronic systolic heart failure   . GERD (gastroesophageal reflux disease)   . Paroxysmal atrial fibrillation     a. on amiodarone, digoxin and Eliquis  . Adenocarcinoma of rectum     a. 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 Prostate cancer     a. s/p seed implants with chemo and radiation  . TIA (transient ischemic attack)   . OSA on CPAP   . HLD (hyperlipidemia)   . Orthostatic hypotension   . Dizziness     a. chronic. Admission for this 07/18/2014  . History of falling July 2015    due to dizziness from medications  . Ischemic cardiomyopathy     EF 18% by nuclear study 2016, multiple myocardial infarctions in past    . Chronic kidney disease, stage IV (severe) 12/11/2013  . DM type 2, uncontrolled, with renal complications 02/12/4075  . Obesity 12/12/2013  . Hematuria 03/2015  . HCAP (healthcare-associated pneumonia) 07/21/2015  . AICD (automatic cardioverter/defibrillator) present 2009    BiVentricular (upgrade)  . 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 History of blood transfusion     "I've had 2 units so far this year" (07/21/2015)  . Anemia     Past Surgical History  Procedure Laterality Date  . Internal defibrillator and pacemaker  2002  . Abdominal and perineal resection of rectum with total mesorectal excision  10/04/2007  . Colonoscopy  09/14/2011    Dr. Gala Romney: via colostomy, Single pedunculated benign inflammatory polyp. Due for surveillance Oct 2015  . Colostomy  2008  .  Colonoscopy N/A 07/02/2014    Procedure: COLONOSCOPY;  Surgeon: Daneil Dolin, MD;  [facility];  Service: Endoscopy;  Laterality: N/A;  7:30 / COLONOSCOPY THRU COLOSTOMY  . Esophagogastroduodenoscopy N/A 07/02/2014    Procedure: ESOPHAGOGASTRODUODENOSCOPY (EGD);  Surgeon: Daneil Dolin, MD;  [facility]: [facility];  Service: Endoscopy;  Laterality: N/A;  7:30  .  Savory dilation N/A 07/02/2014    Procedure: SAVORY DILATION;  Surgeon: Daneil Dolin, MD;  [facility];  Service: Endoscopy;  Laterality: N/A;  7:30  . Maloney dilation N/A 07/02/2014    Procedure: Venia Minks DILATION;  Surgeon: Daneil Dolin, MD;  [facility];  Service: Endoscopy;  Laterality: N/A;  7:30  . Portacath placement  06/2007    "removed ~ 1 yr later"  . Left heart catheterization with coronary angiogram N/A 07/13/2013    Procedure: LEFT HEART CATHETERIZATION WITH CORONARY ANGIOGRAM;  Surgeon: Lorretta Harp, MD;  Location: [facility] CATH LAB;  Service: Cardiovascular;  Laterality: N/A;  . Colonoscopy N/A 12/11/2014    Dr. Gala Romney via colostomy. Normal. Repeat in 2021.   Marland Kitchen Esophagogastroduodenoscopy N/A 12/11/2014    [id number] EGD  . Cardiac defibrillator placement  2009    Upgraded to a BiV ICD  . Right heart catheterization N/A 02/24/2015    Procedure: RIGHT HEART CATH;  Surgeon: Jolaine Artist, MD;  Location: [facility] CATH LAB;  Service: Cardiovascular;  Laterality: N/A;  . Ep implantable device N/A 04/10/2015    Procedure: Ppm/Biv Ppm Generator Changeout;  Surgeon: Evans Lance, MD;  Location: [place] INVASIVE CV LAB CUPID;  Service: Cardiovascular;  Laterality: N/A;  . Bi-ventricular implantable cardioverter defibrillator  (crt-d)  2009  . Cardiac catheterization  08/2001; 2009    ; /notes 07/10/2013  . Coronary angioplasty with stent placement  2001; ~ 2006    "1 + 1"   . Givens capsule study N/A 07/23/2015    Procedure: GIVENS CAPSULE STUDY;  Surgeon: Wilford Corner, MD;  Location: [facility] ENDOSCOPY;  Service: Endoscopy;  Laterality: N/A;    Current Outpatient Prescriptions  Medication Sig Dispense Refill  . apixaban (ELIQUIS) 5 MG TABS tablet Take 5 mg by mouth 2 (two) times daily.    Marland Kitchen BIDIL 20-37.5 MG per tablet Take 1 tablet by mouth 3 (three) times daily.     Marland Kitchen co-enzyme Q-10 50 MG capsule Take 50 mg by mouth every morning.     . ferrous sulfate 325 (65 FE) MG tablet  Take 1 tablet (325 mg total) by mouth 2 (two) times daily with a meal. 60 tablet 0  . fluticasone (FLONASE) 50 MCG/ACT nasal spray Place 1 spray into both nostrils daily as needed for allergies.     . furosemide (LASIX) 40 MG tablet Take 1 tablet (40 mg total) by mouth daily. 30 tablet 11  . glimepiride (AMARYL) 1 MG tablet Take 1 mg by mouth daily with breakfast.    . Insulin Glargine (TOUJEO SOLOSTAR [place]) Inject 30 Units into the skin every morning.    . isosorbide dinitrate (ISORDIL) 30 MG tablet Take 0.5 tablets by mouth 2 (two) times daily.  0  . metoprolol succinate (TOPROL-XL) 25 MG 24 hr tablet Take 25-50 mg by mouth See admin instructions.  0  . nitroGLYCERIN (NITROLINGUAL) 0.4 MG/SPRAY spray Place 1 spray under the tongue every 5 (five) minutes as needed. angina 12 g 3  . pantoprazole (PROTONIX) 40 MG tablet Take 1 tablet (40 mg total) by mouth daily. [place]  tablet 0  . rosuvastatin (CRESTOR) 40 MG tablet Take 1 tablet (40 mg total) by mouth daily at 6 PM. 90 tablet 11  . amoxicillin-clavulanate (AUGMENTIN) 500-125 MG per tablet Take 1 tablet (500 mg total) by mouth every 12 (twelve) hours. (Patient not taking: Reported on 08/01/2015) 8 tablet 0   No current facility-administered medications for this visit.    Allergies as of 08/01/2015  . (No Known Allergies)    Family History  Problem Relation Age of Onset  . Colon cancer Mother 32  . Colon cancer Sister 62  . Coronary artery disease Father   . Colon cancer Other     2 cousins, succumbed to illness    Social History   Social History  . Marital Status: Married    Spouse Name: N/A  . Number of Children: 1  . Years of Education: N/A   Occupational History  .     Social History Main Topics  . Smoking status: Never Smoker   . Smokeless tobacco: Never Used  . Alcohol Use: No     Comment: Former user 45 years ago  . Drug Use: No  . Sexual Activity: No   Other Topics Concern  . None   Social History Narrative    Married 11 years   1 son, 2 grandkids    Denies alcohol, drugs, tobacco     Review of Systems: General: Negative for anorexia, weight loss, fever, chills, fatigue, weakness. ENT: Negative for hoarseness, difficulty swallowing. CV: Negative for chest pain, angina, palpitations, peripheral edema. Positive dyspnea on exertion, no dyspnea at rest or when laying flat. Respiratory: Negative for dyspnea at rest, cough, sputum, wheezing.  GI: See history of present illness. Endo: Negative for unusual weight change.  Heme: Negative for bruising or bleeding. Allergy: Negative for rash or hives.   Physical Exam: BP 84/55 mmHg  Pulse 121  Temp(Src) 98.4 F (36.9 C) (Oral)  Ht 5\' 10"  (1.778 m)  Wt 204 lb 6.4 oz (92.715 kg)  BMI 29.33 kg/m2 General:   Alert and oriented. Pleasant and cooperative. Well-nourished and well-developed.  Head:  Normocephalic and atraumatic. Eyes:  Without icterus, sclera clear and conjunctiva pink.  Cardiovascular:  S1, S2 present without murmurs appreciated. Extremities without clubbing. Very mild 1+ bilateral edema confined to lower ankles. Respiratory:  Clear to auscultation bilaterally. No wheezes, rales, or rhonchi. No distress.  Gastrointestinal:  +BS, soft, non-tender and non-distended. Colostomy bag noted to left anterior abdominal wall with bag empty, no sign of bleeding. No HSM noted. No guarding or rebound. No masses appreciated.  Rectal:  Deferred  Musculoskalatal:  Symmetrical without gross deformities. Normal posture. Skin:  Intact without significant lesions or rashes. Neurologic:  Alert and oriented x4;  grossly normal neurologically. Psych:  Alert and cooperative. Normal mood and affect. Heme/Lymph/Immune: No excessive bruising noted.    08/01/2015 10:25 AM

## 2015-08-01 NOTE — Assessment & Plan Note (Signed)
While patient was admitted to [facility] for exacerbation of heart failure he was noted to be profoundly anemic with a hemoglobin as low as 7.0. He was subsequently transfused while inpatient. He has recently had colonoscopy and endoscopy in January of this year both were essentially normal. While inpatient he underwent a capsule endoscopy for further evaluation which found small, nonbleeding small bowel AVM, mild gastritis, benign-appearing small bowel nodule, questionable colon polyp. No blood or active bleeding noted in the small intestine. He was discharged with a hemoglobin closer to baseline of 10.2. Today I will recheck his CBC to further evaluate his anemia. Return for follow-up in 6-8 weeks. Based on his lab results we can make further plans, although he may need evaluation to hematology for management of anemia. Of note he also has chronic kidney failure and heart failure with likely contributed in factor being anemia of chronic disease.

## 2015-08-01 NOTE — Assessment & Plan Note (Signed)
Right upper quadrant abdominal pain has resolved. This is likely due to cholelithiasis and obstructive jaundice. Follow-up as needed for any recurrence of symptoms.

## 2015-08-01 NOTE — Assessment & Plan Note (Signed)
Symptoms currently well-controlled. Continue regimen of as needed over-the-counter laxatives and/or MiraLAX. Follow-up as needed.

## 2015-08-01 NOTE — Assessment & Plan Note (Signed)
Patient admitted earlier this year with cholelithiasis and obstructive jaundice. Stone was believed to have passed during hospitalization. Last seen 3 months ago liver labs all to baseline except bilirubin which was slightly elevated. Labs checked during most recent hospitalization at [facility] count on 07/14/2015 shows all of her labs return to normal. No further jaundice, scleral icterus, generalized pruritus, or other signs of liver abnormality.

## 2015-08-02 ENCOUNTER — Observation Stay (HOSPITAL_COMMUNITY)
Admission: EM | Admit: 2015-08-02 | Discharge: 2015-08-04 | Disposition: A | Discharge status: Home / Self Care | Payer: Medicare Other | Attending: Internal Medicine | Admitting: Internal Medicine

## 2015-08-02 ENCOUNTER — Emergency Department (HOSPITAL_COMMUNITY): Payer: Medicare Other

## 2015-08-02 ENCOUNTER — Encounter (HOSPITAL_COMMUNITY): Payer: Self-pay | Admitting: Emergency Medicine

## 2015-08-02 DIAGNOSIS — E1165 Type 2 diabetes mellitus with hyperglycemia: Secondary | ICD-10-CM | POA: Insufficient documentation

## 2015-08-02 DIAGNOSIS — J9601 Acute respiratory failure with hypoxia: Secondary | ICD-10-CM | POA: Diagnosis not present

## 2015-08-02 DIAGNOSIS — Z8673 Personal history of transient ischemic attack (TIA), and cerebral infarction without residual deficits: Secondary | ICD-10-CM | POA: Diagnosis not present

## 2015-08-02 DIAGNOSIS — I129 Hypertensive chronic kidney disease with stage 1 through stage 4 chronic kidney disease, or unspecified chronic kidney disease: Secondary | ICD-10-CM | POA: Diagnosis not present

## 2015-08-02 DIAGNOSIS — E1129 Type 2 diabetes mellitus with other diabetic kidney complication: Secondary | ICD-10-CM | POA: Diagnosis present

## 2015-08-02 DIAGNOSIS — I2510 Atherosclerotic heart disease of native coronary artery without angina pectoris: Secondary | ICD-10-CM | POA: Insufficient documentation

## 2015-08-02 DIAGNOSIS — R0789 Other chest pain: Secondary | ICD-10-CM | POA: Insufficient documentation

## 2015-08-02 DIAGNOSIS — Z950 Presence of cardiac pacemaker: Secondary | ICD-10-CM | POA: Diagnosis not present

## 2015-08-02 DIAGNOSIS — N184 Chronic kidney disease, stage 4 (severe): Secondary | ICD-10-CM | POA: Insufficient documentation

## 2015-08-02 DIAGNOSIS — E876 Hypokalemia: Secondary | ICD-10-CM | POA: Insufficient documentation

## 2015-08-02 DIAGNOSIS — I480 Paroxysmal atrial fibrillation: Secondary | ICD-10-CM | POA: Diagnosis not present

## 2015-08-02 DIAGNOSIS — Z8546 Personal history of malignant neoplasm of prostate: Secondary | ICD-10-CM | POA: Insufficient documentation

## 2015-08-02 DIAGNOSIS — G4733 Obstructive sleep apnea (adult) (pediatric): Secondary | ICD-10-CM | POA: Diagnosis not present

## 2015-08-02 DIAGNOSIS — R079 Chest pain, unspecified: Secondary | ICD-10-CM | POA: Diagnosis present

## 2015-08-02 DIAGNOSIS — IMO0002 Reserved for concepts with insufficient information to code with codable children: Secondary | ICD-10-CM | POA: Diagnosis present

## 2015-08-02 DIAGNOSIS — R0600 Dyspnea, unspecified: Secondary | ICD-10-CM

## 2015-08-02 DIAGNOSIS — I252 Old myocardial infarction: Secondary | ICD-10-CM | POA: Insufficient documentation

## 2015-08-02 DIAGNOSIS — N189 Chronic kidney disease, unspecified: Secondary | ICD-10-CM | POA: Diagnosis present

## 2015-08-02 DIAGNOSIS — K219 Gastro-esophageal reflux disease without esophagitis: Secondary | ICD-10-CM | POA: Diagnosis not present

## 2015-08-02 DIAGNOSIS — D638 Anemia in other chronic diseases classified elsewhere: Secondary | ICD-10-CM | POA: Diagnosis present

## 2015-08-02 DIAGNOSIS — Z955 Presence of coronary angioplasty implant and graft: Secondary | ICD-10-CM | POA: Diagnosis not present

## 2015-08-02 DIAGNOSIS — I5023 Acute on chronic systolic (congestive) heart failure: Principal | ICD-10-CM | POA: Insufficient documentation

## 2015-08-02 DIAGNOSIS — D631 Anemia in chronic kidney disease: Secondary | ICD-10-CM | POA: Insufficient documentation

## 2015-08-02 DIAGNOSIS — E785 Hyperlipidemia, unspecified: Secondary | ICD-10-CM | POA: Diagnosis not present

## 2015-08-02 DIAGNOSIS — R0602 Shortness of breath: Secondary | ICD-10-CM | POA: Diagnosis not present

## 2015-08-02 DIAGNOSIS — I255 Ischemic cardiomyopathy: Secondary | ICD-10-CM | POA: Insufficient documentation

## 2015-08-02 LAB — COMPREHENSIVE METABOLIC PANEL
ALBUMIN: 3.1 g/dL — AB (ref 3.5–5.0)
ALT: 20 U/L (ref 17–63)
AST: 27 U/L (ref 15–41)
Alkaline Phosphatase: 85 U/L (ref 38–126)
Anion gap: 10 (ref 5–15)
BILIRUBIN TOTAL: 1.2 mg/dL (ref 0.3–1.2)
BUN: 39 mg/dL — AB (ref 6–20)
CHLORIDE: 101 mmol/L (ref 101–111)
CO2: 23 mmol/L (ref 22–32)
Calcium: 9 mg/dL (ref 8.9–10.3)
Creatinine, Ser: 3.23 mg/dL — ABNORMAL HIGH (ref 0.61–1.24)
GFR calc Af Amer: 22 mL/min — ABNORMAL LOW (ref >60)
GFR calc non Af Amer: 19 mL/min — ABNORMAL LOW (ref >60)
GLUCOSE: 228 mg/dL — AB (ref 65–99)
POTASSIUM: 3.7 mmol/L (ref 3.5–5.1)
SODIUM: 134 mmol/L — AB (ref 135–145)
TOTAL PROTEIN: 7.1 g/dL (ref 6.5–8.1)

## 2015-08-02 LAB — CBC
HEMATOCRIT: 31.3 % — AB (ref 39.0–52.0)
Hemoglobin: 10.4 g/dL — ABNORMAL LOW (ref 13.0–17.0)
MCH: 32.1 pg (ref 26.0–34.0)
MCHC: 33.2 g/dL (ref 30.0–36.0)
MCV: 96.6 fL (ref 78.0–100.0)
Platelets: 141 10*3/uL — ABNORMAL LOW (ref 150–400)
RBC: 3.24 MIL/uL — ABNORMAL LOW (ref 4.22–5.81)
RDW: 16 % — AB (ref 11.5–15.5)
WBC: 6.6 10*3/uL (ref 4.0–10.5)

## 2015-08-02 LAB — I-STAT TROPONIN, ED: Troponin i, poc: 0 ng/mL (ref 0.00–0.08)

## 2015-08-02 LAB — BRAIN NATRIURETIC PEPTIDE: B Natriuretic Peptide: 1004.6 pg/mL — ABNORMAL HIGH (ref 0.0–100.0)

## 2015-08-02 NOTE — ED Provider Notes (Signed)
CSN: [id number]     Arrival date & time 08/02/15  2039 History   First MD Initiated Contact with Patient 08/02/15 2055     Chief Complaint  Patient presents with  . Chest Pain     Patient is a 59 y.o. male presenting with chest pain. The history is provided by the patient. No language interpreter was used.  Chest Pain  Mr. Komatsu presents for evaluation of chest pain. He reports 2 days of chest tightness and discomfort in the central upper chest. Symptoms are associated with shortness of breath. He has a nonproductive cough the last 2 days. He denies any fevers, abdominal pain, vomiting, leg swelling or pain. He was recently admitted to the hospital for pneumonia. He takes Eliquis. He denies any hematochezia. He presents via EMS and received nitroglycerin prior to ED arrival. The nitroglycerin did help resolve his pain but it is starting to return. He has significant dyspnea on exertion and is unable to walk 50 feet without having to rest. Symptoms are moderate, waxing and waning, worsening.  Past Medical History  Diagnosis Date  . Essential hypertension, benign   . CAD (coronary artery disease)     a. BMS to LAD 2001 at [facility] b. PTCA/atherectomy ramus and BMS to LAD 2009  . Chronic systolic heart failure   . GERD (gastroesophageal reflux disease)   . Paroxysmal atrial fibrillation     a. on amiodarone, digoxin and Eliquis  . Adenocarcinoma of rectum     a. 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 Prostate cancer     a. s/p seed implants with chemo and radiation  . TIA (transient ischemic attack)   . OSA on CPAP   . HLD (hyperlipidemia)   . Orthostatic hypotension   . Dizziness     a. chronic. Admission for this 07/18/2014  . History of falling July 2015    due to dizziness from medications  . Ischemic cardiomyopathy     EF 18% by nuclear study 2016, multiple myocardial infarctions in past    . Chronic kidney disease, stage IV (severe) 12/11/2013  . DM type 2, uncontrolled, with renal complications  0/62/6948  . Obesity 12/12/2013  . Hematuria 03/2015  . HCAP (healthcare-associated pneumonia) 07/21/2015  . AICD (automatic cardioverter/defibrillator) present 2009    BiVentricular (upgrade)  . 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 History of blood transfusion     "I've had 2 units so far this year" (07/21/2015)  . Anemia    Past Surgical History  Procedure Laterality Date  . Internal defibrillator and pacemaker  2002  . Abdominal and perineal resection of rectum with total mesorectal excision  10/04/2007  . Colonoscopy  09/14/2011    Dr. Gala Romney: via colostomy, Single pedunculated benign inflammatory polyp. Due for surveillance Oct 2015  . Colostomy  2008  . Colonoscopy N/A 07/02/2014    Procedure: COLONOSCOPY;  Surgeon: Daneil Dolin, MD;  [facility];  Service: Endoscopy;  Laterality: N/A;  7:30 / COLONOSCOPY THRU COLOSTOMY  . Esophagogastroduodenoscopy N/A 07/02/2014    Procedure: ESOPHAGOGASTRODUODENOSCOPY (EGD);  Surgeon: Daneil Dolin, MD;  [facility];  Service: Endoscopy;  Laterality: N/A;  7:30  . Savory dilation N/A 07/02/2014    Procedure: SAVORY DILATION;  Surgeon: Daneil Dolin, MD;  [facility];  Service: Endoscopy;  Laterality: N/A;  7:30  . Maloney dilation N/A 07/02/2014    Procedure: Venia Minks DILATION;  Surgeon: Daneil Dolin, MD;  [facility];  Service: Endoscopy;  Laterality: N/A;  7:30  . Portacath placement  06/2007    "removed ~ 1 yr later"  . Left heart catheterization with coronary angiogram N/A 07/13/2013    Procedure: LEFT HEART CATHETERIZATION WITH CORONARY ANGIOGRAM;  Surgeon: Lorretta Harp, MD;  Location: [facility] CATH LAB;  Service: Cardiovascular;  Laterality: N/A;  . Colonoscopy N/A 12/11/2014    Dr. Gala Romney via colostomy. Normal. Repeat in 2021.   Marland Kitchen Esophagogastroduodenoscopy N/A 12/11/2014    [id number] EGD  . Cardiac defibrillator placement  2009    Upgraded to a BiV ICD  . Right heart  catheterization N/A 02/24/2015    Procedure: RIGHT HEART CATH;  Surgeon: Jolaine Artist, MD;  Location: [facility] CATH LAB;  Service: Cardiovascular;  Laterality: N/A;  . Ep implantable device N/A 04/10/2015    Procedure: Ppm/Biv Ppm Generator Changeout;  Surgeon: Evans Lance, MD;  Location: [place] INVASIVE CV LAB CUPID;  Service: Cardiovascular;  Laterality: N/A;  . Bi-ventricular implantable cardioverter defibrillator  (crt-d)  2009  . Cardiac catheterization  08/2001; 2009    ; /notes 07/10/2013  . Coronary angioplasty with stent placement  2001; ~ 2006    "1 + 1"   . Givens capsule study N/A 07/23/2015    Procedure: GIVENS CAPSULE STUDY;  Surgeon: Wilford Corner, MD;  Location: [facility] ENDOSCOPY;  Service: Endoscopy;  Laterality: N/A;   Family History  Problem Relation Age of Onset  . Colon cancer Mother 22  . Colon cancer Sister 27  . Coronary artery disease Father   . Colon cancer Other     2 cousins, succumbed to illness   Social History  Substance Use Topics  . Smoking status: Never Smoker   . Smokeless tobacco: Never Used  . Alcohol Use: No     Comment: Former user 45 years ago    Review of Systems  Cardiovascular: Positive for chest pain.  All other systems reviewed and are negative.     Allergies  Review of patient's allergies indicates no known allergies.  Home Medications   Prior to Admission medications   Medication Sig Start Date End Date Taking? Authorizing Provider  apixaban (ELIQUIS) 5 MG TABS tablet Take 5 mg by mouth 2 (two) times daily.   Yes Historical Provider, MD  BIDIL 20-37.5 MG per tablet Take 1 tablet by mouth 3 (three) times daily.  07/28/15  Yes Historical Provider, MD  co-enzyme Q-10 50 MG capsule Take 50 mg by mouth every morning.    Yes Historical Provider, MD  ferrous sulfate 325 (65 FE) MG tablet Take 1 tablet (325 mg total) by mouth 2 (two) times daily with a meal. 07/24/15  Yes Shanker Kristeen Mans, MD  fluticasone (FLONASE) 50 MCG/ACT nasal spray Place  1 spray into both nostrils daily as needed for allergies.  11/21/13  Yes Historical Provider, MD  furosemide (LASIX) 40 MG tablet Take 1 tablet (40 mg total) by mouth daily. 04/27/15  Yes Luke K Kilroy, PA-C  glimepiride (AMARYL) 1 MG tablet Take 1 mg by mouth daily with breakfast.   Yes Historical Provider, MD  Insulin Glargine (TOUJEO SOLOSTAR [place]) Inject 30 Units into the skin every morning.   Yes Historical Provider, MD  isosorbide dinitrate (ISORDIL) 30 MG tablet Take 0.5 tablets by mouth 2 (two) times daily. 07/27/15  Yes Historical Provider, MD  metoprolol succinate (TOPROL-XL) 25 MG 24 hr tablet Take 25 mg by mouth 2 (two) times daily.  06/24/15  Yes Historical  Provider, MD  nitroGLYCERIN (NITROLINGUAL) 0.4 MG/SPRAY spray Place 1 spray under the tongue every 5 (five) minutes as needed. angina 07/19/14  Yes Arnoldo Lenis, MD  Omega-3 Fatty Acids (FISH OIL) 1000 MG CAPS Take 1,000 mg by mouth 2 (two) times daily.   Yes Historical Provider, MD  pantoprazole (PROTONIX) 40 MG tablet Take 1 tablet (40 mg total) by mouth daily. 07/24/15  Yes Shanker Kristeen Mans, MD  rosuvastatin (CRESTOR) 40 MG tablet Take 1 tablet (40 mg total) by mouth daily at 6 PM. Patient taking differently: Take 40 mg by mouth at bedtime.  04/27/15  Yes Luke K Kilroy, PA-C  amoxicillin-clavulanate (AUGMENTIN) 500-125 MG per tablet Take 500 mg by mouth 3 (three) times daily.    Historical Provider, MD   BP 108/68 mmHg  Pulse 103  Temp(Src) 97.9 F (36.6 C) (Oral)  Resp 15  Ht 5\' 10"  (1.778 m)  Wt 197 lb (89.359 kg)  BMI 28.27 kg/m2  SpO2 98% Physical Exam  Constitutional: He is oriented to person, place, and time. He appears well-developed and well-nourished.  HENT:  Head: Normocephalic and atraumatic.  Cardiovascular: Normal rate and regular rhythm.   No murmur heard. Pulmonary/Chest: Effort normal and breath sounds normal. No respiratory distress.  Abdominal: Soft. There is no tenderness. There is no rebound and no  guarding.  colostomy in place  Musculoskeletal: He exhibits no tenderness.  1+ pitting edema in BLE.   Neurological: He is alert and oriented to person, place, and time.  Skin: Skin is warm and dry.  Psychiatric: He has a normal mood and affect. His behavior is normal.  Nursing note and vitals reviewed.   ED Course  Procedures (including critical care time) Labs Review Labs Reviewed  CBC - Abnormal; Notable for the following:    RBC 3.24 (*)    Hemoglobin 10.4 (*)    HCT 31.3 (*)    RDW 16.0 (*)    Platelets 141 (*)    All other components within normal limits  COMPREHENSIVE METABOLIC PANEL - Abnormal; Notable for the following:    Sodium 134 (*)    Glucose, Bld 228 (*)    BUN 39 (*)    Creatinine, Ser 3.23 (*)    Albumin 3.1 (*)    GFR calc non Af Amer 19 (*)    GFR calc Af Amer 22 (*)    All other components within normal limits  BRAIN NATRIURETIC PEPTIDE - Abnormal; Notable for the following:    B Natriuretic Peptide 1004.6 (*)    All other components within normal limits  I-STAT TROPOININ, ED    Imaging Review Dg Chest 2 View  08/02/2015   CLINICAL DATA:  Shortness of breath for 2-3 days. Intermittent. Chest pain.  EXAM: CHEST  2 VIEW  COMPARISON:  07/21/2015  FINDINGS: Multi lead left-sided pacemaker remains in place. Cardiomegaly is unchanged. Improving right upper lobe patchy opacity with minimal residual ill-defined opacity in the perihilar region. The lungs are hyperinflated. No new consolidation. No pleural effusion or pneumothorax. No acute osseous abnormalities are seen.  IMPRESSION: 1. Resolving right upper lobe opacity consistent with improving pneumonia. Minimal residual perihilar opacity persists. Recommend additional follow-up in 2-3 weeks to ensure or complete clearing. 2. Chronic cardiomegaly.  No acute process.   Electronically Signed   By: Jeb Levering M.D.   On: 08/02/2015 23:07   I have personally reviewed and evaluated these images and lab results  as part of my medical decision-making.  EKG Interpretation   Date/Time:  Saturday August 02 2015 21:41:22 EDT Ventricular Rate:  105 PR Interval:  100 QRS Duration: 205 QT Interval:  470 QTC Calculation: 621 R Axis:   -99 Text Interpretation:  Sinus or ectopic atrial tachycardia Right bundle  branch block Confirmed by Hazle Coca [phone]) on 08/02/2015 11:22:09 PM      MDM   Final diagnoses:  Dyspnea  CKD (chronic kidney disease), unspecified stage    Patient with extensive cardiac history here for dyspnea on exertion and intermittent chest discomfort. Patient is comfortable at rest in the emergency department. His chest tightness did resolve with nitroglycerin prior to ED arrival. Cardiac rhtyhm is intermittently paced and A. fib in the emergency department. CBC is similar compared to priors with stable anemia. There is slight worsening of his chronic renal insufficiency and slight elevation in his BNP. Chest x-ray demonstrates resolving pneumonia. Patient is not volume overloaded on exam. Plan to admit to medicine for dyspnea on exertion, observation given intermittent chest tightness.  Quintella Reichert, MD 08/02/15 [phone]

## 2015-08-02 NOTE — ED Notes (Signed)
Per MES- Pt here for eval of CP x 2 days. Pt has nitro spray that he took at home from previous MI. Pt denies radiation, but reports diaphoresis, SOB. Pt has st judes pacemaker in place.

## 2015-08-03 ENCOUNTER — Other Ambulatory Visit: Payer: Self-pay

## 2015-08-03 ENCOUNTER — Encounter (HOSPITAL_COMMUNITY): Payer: Self-pay | Admitting: Internal Medicine

## 2015-08-03 DIAGNOSIS — K210 Gastro-esophageal reflux disease with esophagitis: Secondary | ICD-10-CM | POA: Diagnosis not present

## 2015-08-03 DIAGNOSIS — N189 Chronic kidney disease, unspecified: Secondary | ICD-10-CM

## 2015-08-03 DIAGNOSIS — E1129 Type 2 diabetes mellitus with other diabetic kidney complication: Secondary | ICD-10-CM

## 2015-08-03 DIAGNOSIS — E1165 Type 2 diabetes mellitus with hyperglycemia: Secondary | ICD-10-CM

## 2015-08-03 DIAGNOSIS — R079 Chest pain, unspecified: Secondary | ICD-10-CM | POA: Diagnosis present

## 2015-08-03 DIAGNOSIS — I255 Ischemic cardiomyopathy: Secondary | ICD-10-CM | POA: Diagnosis not present

## 2015-08-03 DIAGNOSIS — R0789 Other chest pain: Secondary | ICD-10-CM | POA: Diagnosis not present

## 2015-08-03 DIAGNOSIS — E785 Hyperlipidemia, unspecified: Secondary | ICD-10-CM

## 2015-08-03 DIAGNOSIS — I5023 Acute on chronic systolic (congestive) heart failure: Secondary | ICD-10-CM | POA: Diagnosis not present

## 2015-08-03 LAB — COMPREHENSIVE METABOLIC PANEL
ALBUMIN: 2.8 g/dL — AB (ref 3.5–5.0)
ALT: 21 U/L (ref 17–63)
ANION GAP: 9 (ref 5–15)
AST: 26 U/L (ref 15–41)
Alkaline Phosphatase: 75 U/L (ref 38–126)
BILIRUBIN TOTAL: 1.4 mg/dL — AB (ref 0.3–1.2)
BUN: 40 mg/dL — AB (ref 6–20)
CHLORIDE: 103 mmol/L (ref 101–111)
CO2: 23 mmol/L (ref 22–32)
Calcium: 9.1 mg/dL (ref 8.9–10.3)
Creatinine, Ser: 3.2 mg/dL — ABNORMAL HIGH (ref 0.61–1.24)
GFR calc Af Amer: 23 mL/min — ABNORMAL LOW (ref >60)
GFR, EST NON AFRICAN AMERICAN: 20 mL/min — AB (ref >60)
GLUCOSE: 174 mg/dL — AB (ref 65–99)
POTASSIUM: 3.8 mmol/L (ref 3.5–5.1)
Sodium: 135 mmol/L (ref 135–145)
TOTAL PROTEIN: 7.1 g/dL (ref 6.5–8.1)

## 2015-08-03 LAB — GLUCOSE, CAPILLARY
GLUCOSE-CAPILLARY: 117 mg/dL — AB (ref 65–99)
Glucose-Capillary: 126 mg/dL — ABNORMAL HIGH (ref 65–99)
Glucose-Capillary: 96 mg/dL (ref 65–99)
Glucose-Capillary: 108 mg/dL — ABNORMAL HIGH (ref 65–99)

## 2015-08-03 LAB — TROPONIN I: Troponin I: <0.03 ng/mL (ref <0.031)

## 2015-08-03 MED ORDER — CO-ENZYME Q-10 50 MG PO CAPS: 50.0000 mg | ORAL_CAPSULE | Freq: Every morning | ORAL | Status: DC

## 2015-08-03 MED ORDER — FERROUS SULFATE 325 (65 FE) MG PO TABS
325.0000 mg | ORAL_TABLET | Freq: Two times a day (BID) | ORAL | Status: DC
  Administered 2015-08-03 – 2015-08-04 (×3): 325 mg via ORAL
  Filled 2015-08-03 (×3): qty 1

## 2015-08-03 MED ORDER — ONDANSETRON HCL 4 MG/2ML IJ SOLN: 4.0000 mg | Freq: Four times a day (QID) | INTRAMUSCULAR | Status: DC | PRN

## 2015-08-03 MED ORDER — SODIUM CHLORIDE 0.9 % IJ SOLN
3.0000 mL | Freq: Two times a day (BID) | INTRAMUSCULAR | Status: DC
  Administered 2015-08-03 (×2): 3 mL via INTRAVENOUS

## 2015-08-03 MED ORDER — SODIUM CHLORIDE 0.9 % IJ SOLN
3.0000 mL | Freq: Two times a day (BID) | INTRAMUSCULAR | Status: DC
  Administered 2015-08-03: 3 mL via INTRAVENOUS

## 2015-08-03 MED ORDER — PANTOPRAZOLE SODIUM 40 MG PO TBEC
40.0000 mg | DELAYED_RELEASE_TABLET | Freq: Every day | ORAL | Status: DC
  Administered 2015-08-03 – 2015-08-04 (×2): 40 mg via ORAL
  Filled 2015-08-03 (×3): qty 1

## 2015-08-03 MED ORDER — APIXABAN 5 MG PO TABS
5.0000 mg | ORAL_TABLET | Freq: Two times a day (BID) | ORAL | Status: DC
  Administered 2015-08-03 – 2015-08-04 (×4): 5 mg via ORAL
  Filled 2015-08-03 (×4): qty 1

## 2015-08-03 MED ORDER — ACETAMINOPHEN 325 MG PO TABS: 650.0000 mg | ORAL_TABLET | Freq: Four times a day (QID) | ORAL | Status: DC | PRN

## 2015-08-03 MED ORDER — INSULIN ASPART 100 UNIT/ML ~~LOC~~ SOLN: 0.0000 [IU] | Freq: Every day | SUBCUTANEOUS | Status: DC

## 2015-08-03 MED ORDER — INSULIN GLARGINE 300 UNIT/ML ~~LOC~~ SOPN: 30.0000 [IU] | PEN_INJECTOR | Freq: Every morning | SUBCUTANEOUS | Status: DC

## 2015-08-03 MED ORDER — ALPRAZOLAM 0.25 MG PO TABS
0.2500 mg | ORAL_TABLET | Freq: Two times a day (BID) | ORAL | Status: DC | PRN
Start: 2015-08-03 — End: 2015-08-04
  Filled 2015-08-03: qty 1

## 2015-08-03 MED ORDER — GLIMEPIRIDE 1 MG PO TABS
1.0000 mg | ORAL_TABLET | Freq: Every day | ORAL | Status: DC
  Administered 2015-08-03: 1 mg via ORAL
  Filled 2015-08-03 (×3): qty 1

## 2015-08-03 MED ORDER — FUROSEMIDE 40 MG PO TABS
40.0000 mg | ORAL_TABLET | Freq: Two times a day (BID) | ORAL | Status: DC
  Administered 2015-08-03 – 2015-08-04 (×3): 40 mg via ORAL
  Filled 2015-08-03 (×2): qty 1
  Filled 2015-08-03: qty 2

## 2015-08-03 MED ORDER — SODIUM CHLORIDE 0.9 % IV SOLN: 250.0000 mL | INTRAVENOUS | Status: DC | PRN

## 2015-08-03 MED ORDER — NITROGLYCERIN 0.4 MG/SPRAY TL SOLN: 1.0000 | Status: DC | PRN

## 2015-08-03 MED ORDER — FLUTICASONE PROPIONATE 50 MCG/ACT NA SUSP: 1.0000 | Freq: Every day | NASAL | Status: DC | PRN

## 2015-08-03 MED ORDER — INSULIN ASPART 100 UNIT/ML ~~LOC~~ SOLN
0.0000 [IU] | Freq: Three times a day (TID) | SUBCUTANEOUS | Status: DC
  Administered 2015-08-04: 2 [IU] via SUBCUTANEOUS

## 2015-08-03 MED ORDER — ISOSORB DINITRATE-HYDRALAZINE 20-37.5 MG PO TABS
1.0000 | ORAL_TABLET | Freq: Three times a day (TID) | ORAL | Status: DC
Start: 2015-08-03 — End: 2015-08-04
  Administered 2015-08-03 (×2): 1 via ORAL
  Filled 2015-08-03 (×4): qty 1

## 2015-08-03 MED ORDER — METOPROLOL SUCCINATE ER 25 MG PO TB24
25.0000 mg | ORAL_TABLET | Freq: Two times a day (BID) | ORAL | Status: DC
  Administered 2015-08-03 – 2015-08-04 (×3): 25 mg via ORAL
  Filled 2015-08-03 (×4): qty 1

## 2015-08-03 MED ORDER — SODIUM CHLORIDE 0.9 % IJ SOLN: 3.0000 mL | INTRAMUSCULAR | Status: DC | PRN

## 2015-08-03 MED ORDER — AMOXICILLIN-POT CLAVULANATE 500-125 MG PO TABS: 500.0000 mg | ORAL_TABLET | Freq: Three times a day (TID) | ORAL | Status: DC

## 2015-08-03 MED ORDER — FUROSEMIDE 10 MG/ML IJ SOLN
40.0000 mg | Freq: Two times a day (BID) | INTRAMUSCULAR | Status: DC
  Administered 2015-08-03: 40 mg via INTRAVENOUS
  Filled 2015-08-03 (×2): qty 4

## 2015-08-03 MED ORDER — ROSUVASTATIN CALCIUM 40 MG PO TABS
40.0000 mg | ORAL_TABLET | Freq: Every day | ORAL | Status: DC
  Administered 2015-08-03 (×2): 40 mg via ORAL
  Filled 2015-08-03 (×2): qty 1

## 2015-08-03 MED ORDER — ISOSORBIDE DINITRATE 10 MG PO TABS
15.0000 mg | ORAL_TABLET | Freq: Two times a day (BID) | ORAL | Status: DC
  Administered 2015-08-03 (×2): 15 mg via ORAL
  Filled 2015-08-03 (×3): qty 2

## 2015-08-03 NOTE — ED Notes (Signed)
Hospitalist at bedside 

## 2015-08-03 NOTE — H&P (Signed)
[facility] History and Physical  [name] RFF:[id number] DOB: [birth year] DOA: 08/02/2015  Referring physician: Quintella Reichert, MD PCP: Glo Herring., MD   Chief Complaint: Chest pain  HPI: [name] is a 59 y.o. male with past medical history of CAD, status post coronary artery stenting 2, chronic systolic CHF, paroxysmal atrial fibrillation (on Eliquis), hypertension, history of rectal cancer, history of prostate cancer, TIA, hyperlipidemia, type 2 diabetes, obstructive sleep apnea who comes to the emergency department due to the pressure-like precordial chest pain associated with dyspnea and nonproductive cough for 2 days. He denies dizziness, diaphoresis, nausea, emesis. He has frequent lower extremities edema and complains of having occasional orthopnea and PND. He received sublingual nitroglycerin in route given by EMS which did not change the intensity or the quality of the pain.   He complains that recently he has been having a lot of dyspnea doing things that he normally does without significant effort. He denies fever, chills, abdominal pain, diarrhea or urinary symptoms.  He is currently in no acute distress. He stated that his chest discomfort is minimal and a lot better than he was earlier.   Review of Systems:  Constitutional:  Positive fatigue.  No weight loss, night sweats, Fevers, chills,  HEENT:  No headaches, Difficulty swallowing,Tooth/dental problems,Sore throat,  No sneezing, itching, ear ache, nasal congestion, post nasal drip,  Cardio-vascular:  As above GI:  No heartburn, indigestion, abdominal pain, nausea, vomiting, diarrhea, change in bowel habits, loss of appetite  Resp:  Positive shortness of breath with exertion or at rest. No excess mucus, no productive cough,  positive non-productive cough, No coughing up of blood.No change in color of mucus.No wheezing.No chest wall deformity  Skin:  no rash or lesions.  GU:  no dysuria,  change in color of urine, no urgency or frequency. No flank pain.  Musculoskeletal:  No joint pain or swelling. No decreased range of motion. No back pain.  Psych:  No change in mood or affect. No depression or anxiety. No memory loss.   Past Medical History  Diagnosis Date  . Essential hypertension, benign   . CAD (coronary artery disease)     a. BMS to LAD 2001 at [facility] b. PTCA/atherectomy ramus and BMS to LAD 2009  . Chronic systolic heart failure   . GERD (gastroesophageal reflux disease)   . Paroxysmal atrial fibrillation     a. on amiodarone, digoxin and Eliquis  . Adenocarcinoma of rectum     a. 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 Prostate cancer     a. s/p seed implants with chemo and radiation  . TIA (transient ischemic attack)   . OSA on CPAP   . HLD (hyperlipidemia)   . Orthostatic hypotension   . Dizziness     a. chronic. Admission for this 07/18/2014  . History of falling July 2015    due to dizziness from medications  . Ischemic cardiomyopathy     EF 18% by nuclear study 2016, multiple myocardial infarctions in past    . Chronic kidney disease, stage IV (severe) 12/11/2013  . DM type 2, uncontrolled, with renal complications 3/57/0177  . Obesity 12/12/2013  . Hematuria 03/2015  . HCAP (healthcare-associated pneumonia) 07/21/2015  . AICD (automatic cardioverter/defibrillator) present 2009    BiVentricular (upgrade)  . 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 History of blood transfusion     "I've had 2 units so far this year" (07/21/2015)  . Anemia  Past Surgical History  Procedure Laterality Date  . Internal defibrillator and pacemaker  2002  . Abdominal and perineal resection of rectum with total mesorectal excision  10/04/2007  . Colonoscopy  09/14/2011    Dr. Gala Romney: via colostomy, Single pedunculated benign inflammatory polyp. Due for surveillance Oct 2015  . Colostomy  2008  . Colonoscopy N/A 07/02/2014    Procedure: COLONOSCOPY;  Surgeon: Daneil Dolin, MD;  [facility];  Service: Endoscopy;  Laterality: N/A;  7:30 / COLONOSCOPY THRU COLOSTOMY  . Esophagogastroduodenoscopy N/A 07/02/2014    Procedure: ESOPHAGOGASTRODUODENOSCOPY (EGD);  Surgeon: Daneil Dolin, MD;  Location: [facility];  Service: Endoscopy;  Laterality: N/A;  7:30  . Savory dilation N/A 07/02/2014    Procedure: SAVORY DILATION;  Surgeon: Daneil Dolin, MD;  Location: [facility];  Service: Endoscopy;  Laterality: N/A;  7:30  . Maloney dilation N/A 07/02/2014    Procedure: Venia Minks DILATION;  Surgeon: Daneil Dolin, MD;  Location: [facility];  Service: Endoscopy;  Laterality: N/A;  7:30  . Portacath placement  06/2007    "removed ~ 1 yr later"  . Left heart catheterization with coronary angiogram N/A 07/13/2013    Procedure: LEFT HEART CATHETERIZATION WITH CORONARY ANGIOGRAM;  Surgeon: Lorretta Harp, MD;  Location: [facility] CATH LAB;  Service: Cardiovascular;  Laterality: N/A;  . Colonoscopy N/A 12/11/2014    Dr. Gala Romney via colostomy. Normal. Repeat in 2021.   Marland Kitchen Esophagogastroduodenoscopy N/A 12/11/2014    XIP:JASNKN EGD  . Cardiac defibrillator placement  2009    Upgraded to a BiV ICD  . Right heart catheterization N/A 02/24/2015    Procedure: RIGHT HEART CATH;  Surgeon: Jolaine Artist, MD;  Location: [facility] CATH LAB;  Service: Cardiovascular;  Laterality: N/A;  . Ep implantable device N/A 04/10/2015    Procedure: Ppm/Biv Ppm Generator Changeout;  Surgeon: Evans Lance, MD;  Location: [place] INVASIVE CV LAB CUPID;  Service: Cardiovascular;  Laterality: N/A;  . Bi-ventricular implantable cardioverter defibrillator  (crt-d)  2009  . Cardiac catheterization  08/2001; 2009    ; /notes 07/10/2013  . Coronary angioplasty with stent placement  2001; ~ 2006    "1 + 1"   . Givens capsule study N/A 07/23/2015    Procedure: GIVENS CAPSULE STUDY;  Surgeon: Wilford Corner, MD;  Location: [facility] ENDOSCOPY;  Service: Endoscopy;  Laterality: N/A;   Social History:  reports that he  has never smoked. He has never used smokeless tobacco. He reports that he does not drink alcohol or use illicit drugs.  No Known Allergies  Family History  Problem Relation Age of Onset  . Colon cancer Mother 32  . Colon cancer Sister 32  . Coronary artery disease Father   . Colon cancer Other     2 cousins, succumbed to illness    Prior to Admission medications   Medication Sig Start Date End Date Taking? Authorizing Provider  apixaban (ELIQUIS) 5 MG TABS tablet Take 5 mg by mouth 2 (two) times daily.   Yes Historical Provider, MD  BIDIL 20-37.5 MG per tablet Take 1 tablet by mouth 3 (three) times daily.  07/28/15  Yes Historical Provider, MD  co-enzyme Q-10 50 MG capsule Take 50 mg by mouth every morning.    Yes Historical Provider, MD  ferrous sulfate 325 (65 FE) MG tablet Take 1 tablet (325 mg total) by mouth 2 (two) times daily with a meal. 07/24/15  Yes Shanker Kristeen Mans, MD  fluticasone (  FLONASE) 50 MCG/ACT nasal spray Place 1 spray into both nostrils daily as needed for allergies.  11/21/13  Yes Historical Provider, MD  furosemide (LASIX) 40 MG tablet Take 1 tablet (40 mg total) by mouth daily. 04/27/15  Yes Luke K Kilroy, PA-C  glimepiride (AMARYL) 1 MG tablet Take 1 mg by mouth daily with breakfast.   Yes Historical Provider, MD  Insulin Glargine (TOUJEO SOLOSTAR [place]) Inject 30 Units into the skin every morning.   Yes Historical Provider, MD  isosorbide dinitrate (ISORDIL) 30 MG tablet Take 0.5 tablets by mouth 2 (two) times daily. 07/27/15  Yes Historical Provider, MD  metoprolol succinate (TOPROL-XL) 25 MG 24 hr tablet Take 25 mg by mouth 2 (two) times daily.  06/24/15  Yes Historical Provider, MD  nitroGLYCERIN (NITROLINGUAL) 0.4 MG/SPRAY spray Place 1 spray under the tongue every 5 (five) minutes as needed. angina 07/19/14  Yes Arnoldo Lenis, MD  Omega-3 Fatty Acids (FISH OIL) 1000 MG CAPS Take 1,000 mg by mouth 2 (two) times daily.   Yes Historical Provider, MD  pantoprazole  (PROTONIX) 40 MG tablet Take 1 tablet (40 mg total) by mouth daily. 07/24/15  Yes Shanker Kristeen Mans, MD  rosuvastatin (CRESTOR) 40 MG tablet Take 1 tablet (40 mg total) by mouth daily at 6 PM. Patient taking differently: Take 40 mg by mouth at bedtime.  04/27/15  Yes Luke K Kilroy, PA-C  amoxicillin-clavulanate (AUGMENTIN) 500-125 MG per tablet Take 500 mg by mouth 3 (three) times daily.    Historical Provider, MD   Physical Exam: Filed Vitals:   08/02/15 2200 08/02/15 2215 08/02/15 2230 08/03/15 0109  BP: 101/73 95/63  100/66  Pulse: 106 106  104  Temp:      TempSrc:      Resp: 16 17 14 16   Height:      Weight:      SpO2: 98% 100% 100% 97%    Wt Readings from Last 3 Encounters:  08/02/15 89.359 kg (197 lb)  08/01/15 92.715 kg (204 lb 6.4 oz)  07/29/15 91.627 kg (202 lb)    General:  Appears calm and comfortable Eyes: PERRL, normal lids, irises & conjunctiva ENT: grossly normal hearing, lips & tongue Neck: no LAD, masses or thyromegaly Cardiovascular: RRR, no m/r/g. Bilateral LE edema. Telemetry: SR, no arrhythmias  Respiratory: Bibasilar Rales, no w/r/r. Normal respiratory effort. Abdomen: soft, ntnd Skin: no rash or induration seen on limited exam Musculoskeletal: grossly normal tone BUE/BLE Psychiatric: grossly normal mood and affect, speech fluent and appropriate Neurologic: grossly non-focal.          Labs on Admission:  Basic Metabolic Panel:  Recent Labs Lab 08/02/15 2129  NA 134*  K 3.7  CL 101  CO2 23  GLUCOSE 228*  BUN 39*  CREATININE 3.23*  CALCIUM 9.0   Liver Function Tests:  Recent Labs Lab 08/02/15 2129  AST 27  ALT 20  ALKPHOS 85  BILITOT 1.2  PROT 7.1  ALBUMIN 3.1*   CBC:  Recent Labs Lab 08/01/15 1128 08/02/15 2129  WBC 6.0 6.6  NEUTROABS 4.3  --   HGB 10.0* 10.4*  HCT 30.6* 31.3*  MCV 97.5 96.6  PLT 170 141*    BNP (last 3 results)  Recent Labs  04/23/15 0503 07/21/15 0947 08/02/15 2129  BNP 1218.9* 781.6* 1004.6*      ProBNP (last 3 results)  Recent Labs  09/27/14 0523 11/19/14 0745  PROBNP 9164.0* 5233.0*    Radiological Exams on Admission: Dg Chest 2  View  08/02/2015   CLINICAL DATA:  Shortness of breath for 2-3 days. Intermittent. Chest pain.  EXAM: CHEST  2 VIEW  COMPARISON:  07/21/2015  FINDINGS: Multi lead left-sided pacemaker remains in place. Cardiomegaly is unchanged. Improving right upper lobe patchy opacity with minimal residual ill-defined opacity in the perihilar region. The lungs are hyperinflated. No new consolidation. No pleural effusion or pneumothorax. No acute osseous abnormalities are seen.  IMPRESSION: 1. Resolving right upper lobe opacity consistent with improving pneumonia. Minimal residual perihilar opacity persists. Recommend additional follow-up in 2-3 weeks to ensure or complete clearing. 2. Chronic cardiomegaly.  No acute process.   Electronically Signed   By: Jeb Levering M.D.   On: 08/02/2015 23:07    Echocardiogram: May 2016. History:  PMH:  Atrial fibrillation. Congestive heart failure. Transient ischemic attack. Risk factors: Hypertension. Diabetes mellitus.  ------------------------------------------------------------------- Study Conclusions  - Left ventricle: POor acoustic windows limit study even with Definity use . LVEF is approximately is apprxoiamtely 35% with hypokinesis/akinesis of distal 1/3 of LV. The cavity size was severely dilated. Systolic function was normal. Wall motion was normal; there were no regional wall motion abnormalities. - Aortic valve: There was trivial regurgitation. - Mitral valve: There was mild regurgitation. - Left atrium: The atrium was severely dilated. - Right ventricle: Systolic function was mildly reduced. - Pulmonary arteries: PA peak pressure: 55 mm Hg (S).    EKG: Independently reviewed. Vent. rate 105 BPM PR interval 100 ms QRS duration 205 ms QT/QTc 470/621 ms P-R-T axes 226 261 91 Sinus or  ectopic atrial tachycardia Right bundle branch block  Assessment/Plan Principal Problem:   Chest pain/   Ischemic cardiomyopathy history-EF 35% 04/24/15 echo Admit to telemetry. Serial troponin levels. Repeat EKG in the morning.  Active Problems:   DM type 2, uncontrolled, with renal complications CBG monitoring.    GERD (gastroesophageal reflux disease) Continue PPI.    Hyperlipidemia Continue statin and monitor LFTs.    Anemia of chronic disease Monitor H&H.    Dyspnea Supplemental oxygen and furosemide IV while the patient is in the hospital. Patient just finished antibiotic for pneumonia. There is no fever or elevated white blood cells. Chest x-ray shows improvement.   Code Status: Full code. DVT Prophylaxis: On Eliquis. Family Communication:  Disposition Plan: Admit to telemetry monitoring for serial troponin levels trending.  Time spent: Over 75 minutes.  Reubin Milan [facility] Pager [phone].

## 2015-08-03 NOTE — Progress Notes (Signed)
Patient Demographics:    [name], is a 59 y.o. male, DOB - 05-29-1956, POL:[id number]  Admit date - 08/02/2015   Admitting Physician Reubin Milan, MD  Outpatient Primary MD for the patient is Glo Herring., MD  LOS -    Chief Complaint  Patient presents with  . Chest Pain        Subjective:    [name] today has, No headache, No chest pain, No abdominal pain - No Nausea, No new weakness tingling or numbness, No Cough - improved SOB.   Assessment  & Plan :     1. Acute on chronic hypoxic respiratory failure secondary to acute on chronic systolic heart failure with EF 35% on recent echo due to underlying ischemic cardiomyopathy.     ProBNP was elevated, there was trace edema and rales on exam, has responded very well to Lasix IV, now close to baseline without oxygen demand, completely symptom-free, will increase activity switched to oral Lasix twice a day, continue beta blocker, hydralazine, isosorbide mononitrate, isosorbide dinitrate, fluid and salt restriction, daily weights, oxygen and nebulizer treatment if needed, monitor clinically if stable discharge in the morning. No ACE/ARB due to renal insufficiency.   2. Nonspecific chest tightness due to #1 above. His underlying ischemic cardiomyopathy and previous history of MI. EKG was unchanged confirmed by cardiologist on call, troponin negative will be cycled, continue Eliquis, beta blocker along with statin for secondary prevention.   3. Paroxysmal atrial fibrillation [place] VASC 2 - >3 - continue beta blocker and Eliquis.   4. DM type II. Check A1c, continue home medications and sliding scale.   5. Essential hypertension. Stable on combination of diuretic, isosorbide dinitrate, hydralazine, beta blocker.   6. CKD stage V.  Baseline creatinine appears to be close to 3.2. At baseline monitor.   7. Dyslipidemia on statin continue.   8. Recent pneumonia. Afebrile, does not have a productive cough, no leukocytosis, finished anti-biotic course monitor.   9. GERD. PPI     Code Status : Full  Family Communication  : None  Disposition Plan  : Home 1-2 days  Consults  :  None  Procedures  :   TTE 04-2015  - Left ventricle: POor acoustic windows limit study even withDefinity use . LVEF is approximately is apprxoiamtely 35% withhypokinesis/akinesis of distal 1/3 of LV. The cavity size wasseverely dilated. Systolic function was normal. Wall motion wasnormal; there were no regional wall motion abnormalities. - Aortic valve: There was trivial regurgitation. - Mitral valve: There was mild regurgitation. - Left atrium: The atrium was severely dilated. - Right ventricle: Systolic function was mildly reduced. - Pulmonary arteries: PA peak pressure: 55 mm Hg (S).   DVT Prophylaxis  :  Eliquis  Lab Results  Component Value Date   PLT 141* 08/02/2015    Inpatient Medications  Scheduled Meds: . apixaban  5 mg Oral BID  . ferrous sulfate  325 mg Oral BID WC  . furosemide  40 mg Oral BID  . glimepiride  1 mg Oral Q breakfast  . Insulin Glargine  30 Units Subcutaneous q morning - 10a  . isosorbide dinitrate  15 mg Oral BID  . isosorbide-hydrALAZINE  1 tablet Oral TID  . metoprolol succinate  25 mg Oral BID  . pantoprazole  40 mg Oral Daily  . rosuvastatin  40 mg Oral QHS  . sodium chloride  3 mL Intravenous Q12H   Continuous Infusions:  PRN Meds:.acetaminophen, ALPRAZolam, fluticasone, nitroGLYCERIN, ondansetron (ZOFRAN) IV  Antibiotics  :     Anti-infectives    Start     Dose/Rate Route Frequency Ordered Stop   08/03/15 1000  amoxicillin-clavulanate (AUGMENTIN) 500-125 MG per tablet 500 mg  Status:  Discontinued     500 mg Oral 3 times daily 08/03/15 0136 08/03/15 0140        Objective:     Filed Vitals:   08/03/15 0109 08/03/15 0146 08/03/15 0550 08/03/15 0758  BP: 100/66 120/76 96/61 95/70   Pulse: 104 85 110 110  Temp:  97.7 F (36.5 C) 98.2 F (36.8 C) 97.9 F (36.6 C)  TempSrc:  Oral Oral Oral  Resp: 16 16 16 18   Height:  5\' 10"  (1.778 m)    Weight:  92.987 kg (205 lb)    SpO2: 97% 97% 95% 100%    Wt Readings from Last 3 Encounters:  08/03/15 92.987 kg (205 lb)  08/01/15 92.715 kg (204 lb 6.4 oz)  07/29/15 91.627 kg (202 lb)     Intake/Output Summary (Last 24 hours) at 08/03/15 0910 Last data filed at 08/03/15 0836  Gross per 24 hour  Intake    460 ml  Output   1400 ml  Net   -940 ml     Physical Exam  Awake Alert, Oriented X 3, No new F.N deficits, Normal affect [place].AT,PERRAL Supple Neck,No JVD, No cervical lymphadenopathy appriciated.  Symmetrical Chest wall movement, Good air movement bilaterally, few rales RRR,No Gallops,Rubs or new Murmurs, No Parasternal Heave +ve B.Sounds, Abd Soft, No tenderness, No organomegaly appriciated, No rebound - guarding or rigidity. No Cyanosis, Clubbing , trace edema, No new Rash or bruise      Data Review:   Micro Results No results found for this or any previous visit (from the past 240 hour(s)).  Radiology Reports Ct Abdomen Pelvis Wo Contrast  07/31/2015   CLINICAL DATA:  Followup rectal carcinoma. Previous surgery, chemotherapy, and radiation therapy. Personal history of prostate carcinoma.  EXAM: CT ABDOMEN AND PELVIS WITHOUT CONTRAST  TECHNIQUE: Multidetector CT imaging of the abdomen and pelvis was performed following the standard protocol without IV contrast.  COMPARISON:  04/05/2015  FINDINGS: Lower chest: Small bilateral pleural effusions show no significant change.  Hepatobiliary: No mass visualized on this unenhanced exam. Tiny calcified gallstone again noted, without evidence of cholecystitis or biliary ductal dilatation.  Pancreas: No mass or inflammatory process visualized on this unenhanced  exam.  Spleen:  Within normal limits in size.  Adrenal Glands:  No masses identified.  Kidneys/Urinary tract: No evidence of urolithiasis or hydronephrosis.  Stomach/Bowel/Peritoneum: Left lower quadrant colostomy again demonstrated. Midline presacral soft tissue density is stable an likely due to post treatment change. There is an asymmetric ill-defined soft tissue density in the right perirectal region. This shows mild increase in size since previous study, currently measuring 2.8 x 2.8 cm on image 79, compared to 2.3 x 2.6 cm previously. This is suspicious for recurrent carcinoma.  Vascular/Lymphatic: No pathologically enlarged lymph nodes identified. No abdominal aortic aneurysm or other significant retroperitoneal abnormality demonstrated.  Reproductive:  Brachytherapy seeds again noted in the prostate.  Other:  None.  Musculoskeletal:  No suspicious bone lesions identified.  IMPRESSION: Mild increase in size of asymmetric right perirectal soft tissue density, suspicious  for recurrent carcinoma.  Stable midline presacral soft tissue density, consistent with post treatment change.  Cholelithiasis.  No radiographic evidence of cholecystitis.  Stable small bilateral pleural effusions.   Electronically Signed   By: Earle Gell M.D.   On: 07/31/2015 10:56   Dg Chest 2 View  08/02/2015   CLINICAL DATA:  Shortness of breath for 2-3 days. Intermittent. Chest pain.  EXAM: CHEST  2 VIEW  COMPARISON:  07/21/2015  FINDINGS: Multi lead left-sided pacemaker remains in place. Cardiomegaly is unchanged. Improving right upper lobe patchy opacity with minimal residual ill-defined opacity in the perihilar region. The lungs are hyperinflated. No new consolidation. No pleural effusion or pneumothorax. No acute osseous abnormalities are seen.  IMPRESSION: 1. Resolving right upper lobe opacity consistent with improving pneumonia. Minimal residual perihilar opacity persists. Recommend additional follow-up in 2-3 weeks to ensure or  complete clearing. 2. Chronic cardiomegaly.  No acute process.   Electronically Signed   By: Jeb Levering M.D.   On: 08/02/2015 23:07   Dg Chest 2 View  07/21/2015   CLINICAL DATA:  Wheezing beginning last night.  Initial encounter.  EXAM: CHEST  2 VIEW  COMPARISON:  Single view of the chest 04/25/2015. PA and lateral chest 05/19/2014.  FINDINGS: Heart size is mildly enlarged. Pacing device is in place and appears unchanged. Patchy airspace disease is identified in the right upper lobe. The left lung appears clear. No pneumothorax or pleural effusion.  IMPRESSION: Patchy right upper lobe airspace disease most worrisome for pneumonia. Recommend followup to clearing.   Electronically Signed   By: Inge Rise M.D.   On: 07/21/2015 09:28     CBC  Recent Labs Lab 08/01/15 1128 08/02/15 2129  WBC 6.0 6.6  HGB 10.0* 10.4*  HCT 30.6* 31.3*  PLT 170 141*  MCV 97.5 96.6  MCH 31.8 32.1  MCHC 32.7 33.2  RDW 16.4* 16.0*  LYMPHSABS 1.1  --   MONOABS 0.4  --   EOSABS 0.2  --   BASOSABS 0.0  --     Chemistries   Recent Labs Lab 08/02/15 2129 08/03/15 0345  NA 134* 135  K 3.7 3.8  CL 101 103  CO2 23 23  GLUCOSE 228* 174*  BUN 39* 40*  CREATININE 3.23* 3.20*  CALCIUM 9.0 9.1  AST 27 26  ALT 20 21  ALKPHOS 85 75  BILITOT 1.2 1.4*   ------------------------------------------------------------------------------------------------------------------ estimated creatinine clearance is 28.1 mL/min (by C-G formula based on Cr of 3.2). ------------------------------------------------------------------------------------------------------------------ No results for input(s): HGBA1C in the last 72 hours. ------------------------------------------------------------------------------------------------------------------ No results for input(s): CHOL, HDL, LDLCALC, TRIG, CHOLHDL, LDLDIRECT in the last 72  hours. ------------------------------------------------------------------------------------------------------------------ No results for input(s): TSH, T4TOTAL, T3FREE, THYROIDAB in the last 72 hours.  Invalid input(s): FREET3 ------------------------------------------------------------------------------------------------------------------ No results for input(s): VITAMINB12, FOLATE, FERRITIN, TIBC, IRON, RETICCTPCT in the last 72 hours.  Coagulation profile No results for input(s): INR, PROTIME in the last 168 hours.  No results for input(s): DDIMER in the last 72 hours.  Cardiac Enzymes  Recent Labs Lab 08/03/15 0345  TROPONINI <0.03   ------------------------------------------------------------------------------------------------------------------ Invalid input(s): POCBNP   Time Spent in minutes  35   SINGH,PRASHANT K M.D on 08/03/2015 at 9:10 AM  Between 7am to 7pm - Pager - [phone]  After 7pm go to [email or web address] - password [facility]  [facility] -  Office  [phone]    '

## 2015-08-03 NOTE — Progress Notes (Signed)
Patient refused CPAP for tonight. RT informed Pt to call if he changes his mind. 

## 2015-08-04 DIAGNOSIS — E1129 Type 2 diabetes mellitus with other diabetic kidney complication: Secondary | ICD-10-CM | POA: Diagnosis not present

## 2015-08-04 DIAGNOSIS — N189 Chronic kidney disease, unspecified: Secondary | ICD-10-CM | POA: Diagnosis not present

## 2015-08-04 DIAGNOSIS — D638 Anemia in other chronic diseases classified elsewhere: Secondary | ICD-10-CM | POA: Diagnosis not present

## 2015-08-04 DIAGNOSIS — I255 Ischemic cardiomyopathy: Secondary | ICD-10-CM | POA: Diagnosis not present

## 2015-08-04 DIAGNOSIS — I5023 Acute on chronic systolic (congestive) heart failure: Secondary | ICD-10-CM | POA: Diagnosis not present

## 2015-08-04 LAB — MAGNESIUM: Magnesium: 2.2 mg/dL (ref 1.7–2.4)

## 2015-08-04 LAB — BASIC METABOLIC PANEL
ANION GAP: 9 (ref 5–15)
BUN: 38 mg/dL — ABNORMAL HIGH (ref 6–20)
CO2: 25 mmol/L (ref 22–32)
Calcium: 8.8 mg/dL — ABNORMAL LOW (ref 8.9–10.3)
Chloride: 103 mmol/L (ref 101–111)
Creatinine, Ser: 3.01 mg/dL — ABNORMAL HIGH (ref 0.61–1.24)
GFR calc Af Amer: 24 mL/min — ABNORMAL LOW (ref >60)
GFR calc non Af Amer: 21 mL/min — ABNORMAL LOW (ref >60)
GLUCOSE: 97 mg/dL (ref 65–99)
POTASSIUM: 3.3 mmol/L — AB (ref 3.5–5.1)
Sodium: 137 mmol/L (ref 135–145)

## 2015-08-04 LAB — URINE CULTURE: Culture: NO GROWTH

## 2015-08-04 LAB — GLUCOSE, CAPILLARY
Glucose-Capillary: 68 mg/dL (ref 65–99)
Glucose-Capillary: 72 mg/dL (ref 65–99)
Glucose-Capillary: 176 mg/dL — ABNORMAL HIGH (ref 65–99)

## 2015-08-04 LAB — HEMOGLOBIN A1C
Hgb A1c MFr Bld: 6.9 % — ABNORMAL HIGH (ref 4.8–5.6)
Mean Plasma Glucose: 151 mg/dL

## 2015-08-04 MED ORDER — POTASSIUM CHLORIDE CRYS ER 20 MEQ PO TBCR: 40.0000 meq | EXTENDED_RELEASE_TABLET | Freq: Two times a day (BID) | ORAL | Status: DC

## 2015-08-04 MED ORDER — FUROSEMIDE 40 MG PO TABS: 40.0000 mg | ORAL_TABLET | Freq: Every day | ORAL | Status: DC

## 2015-08-04 MED ORDER — POTASSIUM CHLORIDE CRYS ER 20 MEQ PO TBCR
40.0000 meq | EXTENDED_RELEASE_TABLET | Freq: Once | ORAL | Status: AC
  Administered 2015-08-04: 40 meq via ORAL
  Filled 2015-08-04: qty 2

## 2015-08-04 MED ORDER — POTASSIUM CHLORIDE CRYS ER 20 MEQ PO TBCR: 20.0000 meq | EXTENDED_RELEASE_TABLET | Freq: Every day | ORAL | Status: DC

## 2015-08-04 NOTE — Discharge Instructions (Signed)
Follow with Primary MD Glo Herring., MD in 7 days   Get CBC, CMP, Magnesium, 2 view Chest X ray checked  by Primary MD next visit.    Activity: As tolerated with Full fall precautions use walker/cane & assistance as needed   Disposition Home    Diet: Heart Healthy Low Carb. Check your Weight same time everyday, if you gain over 2 pounds, or you develop in leg swelling, experience more shortness of breath or chest pain, call your Primary MD immediately. Follow Cardiac Low Salt Diet and 1.5 lit/day fluid restriction.   On your next visit with your primary care physician please Get Medicines reviewed and adjusted.   Please request your Prim.MD to go over all Hospital Tests and Procedure/Radiological results at the follow up, please get all Hospital records sent to your Prim MD by signing hospital release before you go home.   If you experience worsening of your admission symptoms, develop shortness of breath, life threatening emergency, suicidal or homicidal thoughts you must seek medical attention immediately by calling 911 or calling your MD immediately  if symptoms less severe.  You Must read complete instructions/literature along with all the possible adverse reactions/side effects for all the Medicines you take and that have been prescribed to you. Take any new Medicines after you have completely understood and accpet all the possible adverse reactions/side effects.   Do not drive, operating heavy machinery, perform activities at heights, swimming or participation in water activities or provide baby sitting services if your were admitted for syncope or siezures until you have seen by Primary MD or a Neurologist and advised to do so again.  Do not drive when taking Pain medications.    Do not take more than prescribed Pain, Sleep and Anxiety Medications  Special Instructions: If you have smoked or chewed Tobacco  in the last 2 yrs please stop smoking, stop any regular Alcohol   and or any Recreational drug use.  Wear Seat belts while driving.   Please note  You were cared for by a hospitalist during your hospital stay. If you have any questions about your discharge medications or the care you received while you were in the hospital after you are discharged, you can call the unit and asked to speak with the hospitalist on call if the hospitalist that took care of you is not available. Once you are discharged, your primary care physician will handle any further medical issues. Please note that NO REFILLS for any discharge medications will be authorized once you are discharged, as it is imperative that you return to your primary care physician (or establish a relationship with a primary care physician if you do not have one) for your aftercare needs so that they can reassess your need for medications and monitor your lab values.

## 2015-08-04 NOTE — Discharge Summary (Signed)
[name], is a 59 y.o. male  DOB [birth year]  MRN [id number].  Admission date:  08/02/2015  Admitting Physician  Reubin Milan, MD  Discharge Date:  08/04/2015   Primary MD  Glo Herring., MD  Recommendations for primary care physician for things to follow:   Check BMP, magnesium, weight and diuretic dose closely   Admission Diagnosis  Dyspnea [R06.00] CKD (chronic kidney disease), unspecified stage [N18.9]   Discharge Diagnosis  Dyspnea [R06.00] CKD (chronic kidney disease), unspecified stage [N18.9]    Principal Problem:   Chest pain Active Problems:   Ischemic cardiomyopathy-EF 35% 04/24/15 echo   DM type 2, uncontrolled, with renal complications   GERD (gastroesophageal reflux disease)   Hyperlipidemia   Anemia of chronic disease   Dyspnea   CKD (chronic kidney disease)      Past Medical History  Diagnosis Date  . Essential hypertension, benign   . CAD (coronary artery disease)     a. BMS to LAD 2001 at [facility] b. PTCA/atherectomy ramus and BMS to LAD 2009  . Chronic systolic heart failure   . GERD (gastroesophageal reflux disease)   . Paroxysmal atrial fibrillation     a. on amiodarone, digoxin and Eliquis  . Adenocarcinoma of rectum     a. 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 Prostate cancer     a. s/p seed implants with chemo and radiation  . TIA (transient ischemic attack)   . OSA on CPAP   . HLD (hyperlipidemia)   . Orthostatic hypotension   . Dizziness     a. chronic. Admission for this 07/18/2014  . History of falling July 2015    due to dizziness from medications  . Ischemic cardiomyopathy     EF 18% by nuclear study 2016, multiple myocardial infarctions in past    . Chronic kidney disease, stage IV (severe) 12/11/2013  . DM type 2, uncontrolled, with renal complications 04/10/3975  .  Obesity 12/12/2013  . Hematuria 03/2015  . HCAP (healthcare-associated pneumonia) 07/21/2015  . AICD (automatic cardioverter/defibrillator) present 2009    BiVentricular (upgrade)  . 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 History of blood transfusion     "I've had 2 units so far this year" (07/21/2015)  . Anemia     Past Surgical History  Procedure Laterality Date  . Internal defibrillator and pacemaker  2002  . Abdominal and perineal resection of rectum with total mesorectal excision  10/04/2007  . Colonoscopy  09/14/2011    Dr. Gala Romney: via colostomy, Single pedunculated benign inflammatory polyp. Due for surveillance Oct 2015  . Colostomy  2008  . Colonoscopy N/A 07/02/2014    Procedure: COLONOSCOPY;  Surgeon: Daneil Dolin, MD;  Location: [name];  Service: Endoscopy;  Laterality: N/A;  7:30 / COLONOSCOPY THRU COLOSTOMY  . Esophagogastroduodenoscopy N/A 07/02/2014    Procedure: ESOPHAGOGASTRODUODENOSCOPY (EGD);  Surgeon: Daneil Dolin, MD;  Location: [name];  Service: Endoscopy;  Laterality: N/A;  7:30  . Savory dilation N/A 07/02/2014  Procedure: SAVORY DILATION;  Surgeon: Daneil Dolin, MD;  Location: [name];  Service: Endoscopy;  Laterality: N/A;  7:30  . Maloney dilation N/A 07/02/2014    Procedure: Venia Minks DILATION;  Surgeon: Daneil Dolin, MD;  Location: [name];  Service: Endoscopy;  Laterality: N/A;  7:30  . Portacath placement  06/2007    "removed ~ 1 yr later"  . Left heart catheterization with coronary angiogram N/A 07/13/2013    Procedure: LEFT HEART CATHETERIZATION WITH CORONARY ANGIOGRAM;  Surgeon: Lorretta Harp, MD;  [facility] CATH LAB;  Service: Cardiovascular;  Laterality: N/A;  . Colonoscopy N/A 12/11/2014    Dr. Gala Romney via colostomy. Normal. Repeat in 2021.   Marland Kitchen Esophagogastroduodenoscopy N/A 12/11/2014    [name] EGD  . Cardiac defibrillator placement  2009    Upgraded to a BiV ICD  . Right heart  catheterization N/A 02/24/2015    Procedure: RIGHT HEART CATH;  Surgeon: Robert Artist, MD;  Location: [facility] CATH LAB;  Service: Cardiovascular;  Laterality: N/A;  . Ep implantable device N/A 04/10/2015    Procedure: Ppm/Biv Ppm Generator Changeout;  Surgeon: Evans Lance, MD;  Location: [facility] INVASIVE CV LAB CUPID;  Service: Cardiovascular;  Laterality: N/A;  . Bi-ventricular implantable cardioverter defibrillator  (crt-d)  2009  . Cardiac catheterization  08/2001; 2009    ; /notes 07/10/2013  . Coronary angioplasty with stent placement  2001; ~ 2006    "1 + 1"   . Givens capsule study N/A 07/23/2015    Procedure: GIVENS CAPSULE STUDY;  Surgeon: Robert Corner, MD;  [facility];  Service: Endoscopy;  Laterality: N/A;       HPI  from the history and physical done on the day of admission:    [name] is a 59 y.o. male with past medical history of CAD, status post coronary artery stenting 2, chronic systolic CHF, paroxysmal atrial fibrillation (on Eliquis), hypertension, history of rectal cancer, history of prostate cancer, TIA, hyperlipidemia, type 2 diabetes, obstructive sleep apnea who comes to the emergency department due to the pressure-like precordial chest pain associated with dyspnea and nonproductive cough for 2 days. He denies dizziness, diaphoresis, nausea, emesis. He has frequent lower extremities edema and complains of having occasional orthopnea and PND. He received sublingual nitroglycerin in route given by EMS which did not change the intensity or the quality of the pain.   He complains that recently he has been having a lot of dyspnea doing things that he normally does without significant effort. He denies fever, chills, abdominal pain, diarrhea or urinary symptoms.  He is currently in no acute distress. He stated that his chest discomfort is minimal and a lot better than he was earlier.     Hospital Course:     1. Acute on chronic hypoxic respiratory failure  secondary to acute on chronic systolic heart failure with EF 35% on recent echo due to underlying ischemic cardiomyopathy.   ProBNP was elevated, there was trace edema and rales on exam, has responded very well to Lasix IV, now close to baseline without oxygen demand, completely symptom-free, ambulated in the hallway without any distress, we'll continue home dose Lasix with extra 40 mg if he notes more than 2 pounds weight gain in 24 hours, also placed on low-dose potassium, continue beta blocker, hydralazine, isosorbide mononitrate, isosorbide dinitrate, fluid and salt restriction, daily weights, written instructions given. Completely symptom-free eager to go home will follow with PCP and his primary cardiologist soon  after discharge. No ACE/ARB due to renal insufficiency.   2. Nonspecific chest tightness due to #1 above. His underlying ischemic cardiomyopathy and previous history of MI. EKG was unchanged confirmed by cardiologist on call, troponin negative x 3, with extra dose Lasix all symptoms have completely resolved, most likely symptoms coming from CHF, continue Eliquis, beta blocker along with statin for secondary prevention. CHF treatment as above.   3. Paroxysmal atrial fibrillation [place] VASC 2 - >3 - continue beta blocker and Eliquis.   4. DM type II. A1c was 8.9, for now continue home medications and will request PCP to monitor glycemic control closely.  Lab Results  Component Value Date   HGBA1C 8.9* 04/05/2015     5. Essential hypertension. Stable on combination of diuretic, isosorbide dinitrate, hydralazine, beta blocker.   6. CKD stage V. Baseline creatinine appears to be close to 3.2. At baseline monitor.   7. Dyslipidemia on statin continue.   8. Recent pneumonia. Afebrile, does not have a productive cough, no leukocytosis, finished anti-biotic course monitor.   9. GERD. PPI   10. Hypokalemia. Replaced, will place on low-dose potassium at home. Request PCP to  monitor BMP diuretic dose and potassium dose closely.       Discharge Condition: Stable  Follow UP  Follow-up Information    Follow up with Glo Herring., MD. Schedule an appointment as soon as possible for a visit in 1 week.   Specialty:  Internal Medicine   Contact information:   [address] [place] [address] [phone]       Follow up with Glori Bickers, MD. Schedule an appointment as soon as possible for a visit in 1 week.   Specialty:  Cardiology   Why:  CHF   Contact information:   [address] [place] [address] [phone]        Consults obtained - None  Diet and Activity recommendation: See Discharge Instructions below  Discharge Instructions        Discharge Instructions    Discharge instructions    Complete by:  As directed   Follow with Primary MD Glo Herring., MD in 7 days   Get CBC, CMP, Magnesium, 2 view Chest X ray checked  by Primary MD next visit.    Activity: As tolerated with Full fall precautions use walker/cane & assistance as needed   Disposition Home    Diet: Heart Healthy Low Carb. Check your Weight same time everyday, if you gain over 2 pounds, or you develop in leg swelling, experience more shortness of breath or chest pain, call your Primary MD immediately. Follow Cardiac Low Salt Diet and 1.5 lit/day fluid restriction.   On your next visit with your primary care physician please Get Medicines reviewed and adjusted.   Please request your Prim.MD to go over all Hospital Tests and Procedure/Radiological results at the follow up, please get all Hospital records sent to your Prim MD by signing hospital release before you go home.   If you experience worsening of your admission symptoms, develop shortness of breath, life threatening emergency, suicidal or homicidal thoughts you must seek medical attention immediately by calling 911 or calling your MD immediately  if symptoms less  severe.  You Must read complete instructions/literature along with all the possible adverse reactions/side effects for all the Medicines you take and that have been prescribed to you. Take any new Medicines after you have completely understood and accpet all the possible adverse reactions/side effects.  Do not drive, operating heavy machinery, perform activities at heights, swimming or participation in water activities or provide baby sitting services if your were admitted for syncope or siezures until you have seen by Primary MD or a Neurologist and advised to do so again.  Do not drive when taking Pain medications.    Do not take more than prescribed Pain, Sleep and Anxiety Medications  Special Instructions: If you have smoked or chewed Tobacco  in the last 2 yrs please stop smoking, stop any regular Alcohol  and or any Recreational drug use.  Wear Seat belts while driving.   Please note  You were cared for by a hospitalist during your hospital stay. If you have any questions about your discharge medications or the care you received while you were in the hospital after you are discharged, you can call the unit and asked to speak with the hospitalist on call if the hospitalist that took care of you is not available. Once you are discharged, your primary care physician will handle any further medical issues. Please note that NO REFILLS for any discharge medications will be authorized once you are discharged, as it is imperative that you return to your primary care physician (or establish a relationship with a primary care physician if you do not have one) for your aftercare needs so that they can reassess your need for medications and monitor your lab values.     Increase activity slowly    Complete by:  As directed              Discharge Medications       Medication List    STOP taking these medications        amoxicillin-clavulanate 500-125 MG per tablet  Commonly known as:   AUGMENTIN      TAKE these medications        apixaban 5 MG Tabs tablet  Commonly known as:  ELIQUIS  Take 5 mg by mouth 2 (two) times daily.     BIDIL 20-37.5 MG per tablet  Generic drug:  isosorbide-hydrALAZINE  Take 1 tablet by mouth 3 (three) times daily.     co-enzyme Q-10 50 MG capsule  Take 50 mg by mouth every morning.     ferrous sulfate 325 (65 FE) MG tablet  Take 1 tablet (325 mg total) by mouth 2 (two) times daily with a meal.     Fish Oil 1000 MG Caps  Take 1,000 mg by mouth 2 (two) times daily.     fluticasone 50 MCG/ACT nasal spray  Commonly known as:  FLONASE  Place 1 spray into both nostrils daily as needed for allergies.     furosemide 40 MG tablet  Commonly known as:  LASIX  Take 1 tablet (40 mg total) by mouth daily. Take an additional pill with an additional potassium pill if you gain over 2 pounds in 24 hours.     glimepiride 1 MG tablet  Commonly known as:  AMARYL  Take 1 mg by mouth daily with breakfast.     isosorbide dinitrate 30 MG tablet  Commonly known as:  ISORDIL  Take 0.5 tablets by mouth 2 (two) times daily.     metoprolol succinate 25 MG 24 hr tablet  Commonly known as:  TOPROL-XL  Take 25 mg by mouth 2 (two) times daily.     nitroGLYCERIN 0.4 MG/SPRAY spray  Commonly known as:  NITROLINGUAL  Place 1 spray under the tongue every 5 (five) minutes as  needed. angina     pantoprazole 40 MG tablet  Commonly known as:  PROTONIX  Take 1 tablet (40 mg total) by mouth daily.     potassium chloride SA 20 MEQ tablet  Commonly known as:  K-DUR,KLOR-CON  Take 1 tablet (20 mEq total) by mouth daily.     rosuvastatin 40 MG tablet  Commonly known as:  CRESTOR  Take 1 tablet (40 mg total) by mouth daily at 6 PM.     TOUJEO SOLOSTAR [place]  Inject 30 Units into the skin every morning.        Major procedures and Radiology Reports - PLEASE review detailed and final reports for all details, in brief -    Dg Chest 2 View  08/02/2015    CLINICAL DATA:  Shortness of breath for 2-3 days. Intermittent. Chest pain.  EXAM: CHEST  2 VIEW  COMPARISON:  07/21/2015  FINDINGS: Multi lead left-sided pacemaker remains in place. Cardiomegaly is unchanged. Improving right upper lobe patchy opacity with minimal residual ill-defined opacity in the perihilar region. The lungs are hyperinflated. No new consolidation. No pleural effusion or pneumothorax. No acute osseous abnormalities are seen.  IMPRESSION: 1. Resolving right upper lobe opacity consistent with improving pneumonia. Minimal residual perihilar opacity persists. Recommend additional follow-up in 2-3 weeks to ensure or complete clearing. 2. Chronic cardiomegaly.  No acute process.   Electronically Signed   By: [name] M.D.   On: 08/02/2015 23:07      Micro Results      No results found for this or any previous visit (from the past 240 hour(s)).     Today   Subjective    Benuel Ly today has no headache,no chest abdominal pain,no new weakness tingling or numbness, feels much better wants to go home today.     Objective   Blood pressure 91/61, pulse 88, temperature 97.6 F (36.4 C), temperature source Oral, resp. rate 20, height 5\' 10"  (1.778 m), weight 91.037 kg (200 lb 11.2 oz), SpO2 98 %.   Intake/Output Summary (Last 24 hours) at 08/04/15 0930 Last data filed at 08/04/15 0844  Gross per 24 hour  Intake    960 ml  Output   2950 ml  Net  -1990 ml    Exam Awake Alert, Oriented x 3, No new F.N deficits, Normal affect [place].AT,PERRAL Supple Neck,No JVD, No cervical lymphadenopathy appriciated.  Symmetrical Chest wall movement, Good air movement bilaterally, CTAB RRR,No Gallops,Rubs or new Murmurs, No Parasternal Heave +ve B.Sounds, Abd Soft, Non tender, No organomegaly appriciated, No rebound -guarding or rigidity. No Cyanosis, Clubbing or edema, No new Rash or bruise   Data Review   CBC w Diff: Lab Results  Component Value Date   WBC 6.6 08/02/2015    WBC 9.3 05/02/2012   HGB 10.4* 08/02/2015   HGB 14.6 05/02/2012   HCT 31.3* 08/02/2015   HCT 23.6* 04/24/2015   HCT 43.6 05/02/2012   PLT 141* 08/02/2015   PLT 163 05/02/2012   LYMPHOPCT 19 08/01/2015   LYMPHOPCT 13.1* 05/02/2012   MONOPCT 7 08/01/2015   MONOPCT 6.0 05/02/2012   EOSPCT 3 08/01/2015   EOSPCT 0.4 05/02/2012   BASOPCT 0 08/01/2015   BASOPCT 0.3 05/02/2012    CMP: Lab Results  Component Value Date   NA 137 08/04/2015   K 3.3* 08/04/2015   CL 103 08/04/2015   CO2 25 08/04/2015   BUN 38* 08/04/2015   CREATININE 3.01* 08/04/2015   CREATININE 2.20* 04/08/2015   PROT  7.1 08/03/2015   ALBUMIN 2.8* 08/03/2015   BILITOT 1.4* 08/03/2015   ALKPHOS 75 08/03/2015   AST 26 08/03/2015   ALT 21 08/03/2015  .   Total Time in preparing paper work, data evaluation and todays exam - 35 minutes  Thurnell Lose M.D on 08/04/2015 at 9:30 AM  [facility]   Office  [phone]

## 2015-08-04 NOTE — Consult Note (Signed)
   [facility] CM Inpatient Consult   08/04/2015  [name] [birth year] [id number] Patient had consented to [place] Management services at his last hospitalization.  Checked with patient today to see if he was still consenting to [place] Management services.  Patient expressed his interest in the program.  He states that he doesn't remember receiving any messages and confirmed that his best contact number is [phone].  He denies any changes in his home address.   Patient will receive a ongoing transition of care calls and will be evaluated for monthly home visits for assessments and disease process education.  Made Inpatient Case Manager aware that [facility] Management involvement. Of note, [facility] Care Management services does not replace or interfere with any services that are arranged by inpatient case management or social work.  For additional questions or referrals please contact: Natividad Brood, RN BSN [facility] Liaison  [phone] business mobile phone

## 2015-08-04 NOTE — Progress Notes (Signed)
At 1306 all d/c instructions explained and given to pt. D/c off floor via w/c to awaiting transport.  Jennel Mara,RN.

## 2015-08-04 NOTE — Progress Notes (Signed)
Inpatient Diabetes Program Recommendations  AACE/ADA: New Consensus Statement on Inpatient Glycemic Control (2013)  Target Ranges:  Prepandial:   less than 140 mg/dL      Peak postprandial:   less than 180 mg/dL (1-2 hours)      Critically ill patients:  140 - 180 mg/dL   Results for [name], [name] (MRN [id number]) as of 08/04/2015 08:28  Ref. Range 08/03/2015 06:01 08/03/2015 11:31 08/03/2015 16:51 08/03/2015 21:17 08/04/2015 06:46 08/04/2015 07:18  Glucose-Capillary Latest Ref Range: 65-99 mg/dL 126 (H) 96 108 (H) 117 (H) 68 72    Diabetes history: DM2 Outpatient Diabetes medications: Amaryl 1 mg QAM, Toujeo 30 units QAM Current orders for Inpatient glycemic control: Amaryl 1 mg QAM, Toujeo 30 units QAM, Novolog 0-9 units TID with meals, Novolog 0-5 units HS  Inpatient Diabetes Program Recommendations Insulin - Basal: In reviewing the chart, noted basal insulin was NOT given yesterday. Despite not receiving any basal insulin, glucose 68 mg/dl at 6:46 am today. Please consider discontinuing Toujeo at this time.  Thanks, Barnie Alderman, RN, MSN, CCRN, CDE Diabetes Coordinator Inpatient Diabetes Program [phone] (Team Pager from [place] to [place]) [phone] ([facility] office) [phone] [facility] office) [phone] [facility] office)

## 2015-08-04 NOTE — Progress Notes (Signed)
Pt.  BP this am was 94/58, p 96.  Notified Dr Candiss Norse and instructed to give the meds one hour apart.  Metoprolol 25mg  given po.  Now bp 82/60.  MD made aware and instructed not to give the other BP meds and instruct pt to take at home.  Pt notified.  Verbalized understanding.  Will continue to monitor.  Karie Kirks, Therapist, sports.

## 2015-08-04 NOTE — Progress Notes (Signed)
Pharmacist Heart Failure Core Measure Documentation  Assessment: [name] has an EF documented as 35% on 04/24/2015.  Rationale: Heart failure patients with left ventricular systolic dysfunction (LVSD) and an EF < 40% should be prescribed an angiotensin converting enzyme inhibitor (ACEI) or angiotensin receptor blocker (ARB) at discharge unless a contraindication is documented in the medical record.  This patient is not currently on an ACEI or ARB for HF.  This note is being placed in the record in order to provide documentation that a contraindication to the use of these agents is present for this encounter.  ACE Inhibitor or Angiotensin Receptor Blocker is contraindicated (specify all that apply)  []   ACEI allergy AND ARB allergy []   Angioedema []   Moderate or severe aortic stenosis []   Hyperkalemia [x]   Hypotension []   Renal artery stenosis []   Worsening renal function, preexisting renal disease or dysfunction  Note, recent cardiology visit on 07/29/2015 stated "medical therapy has been scaled back previously due to problems with orthostatic dizziness and falls. Patient with previous fall resulting in subarachnoid hemorrhage."  Pt was previously on enalapril.  Pt w/ DM and CKD would benefit from ACEi/ARB therapy if able to tolerate in the future.  Thank you!  Drucie Opitz, PharmD Clinical Pharmacist Pager: [phone] 08/04/2015 8:55 AM

## 2015-08-05 ENCOUNTER — Telehealth: Payer: Self-pay | Admitting: *Deleted

## 2015-08-05 ENCOUNTER — Other Ambulatory Visit: Payer: Self-pay | Admitting: *Deleted

## 2015-08-05 NOTE — Telephone Encounter (Signed)
-----   Message from Ladell Pier, MD sent at 08/02/2015  5:26 PM EDT ----- Please call patient, peri-rectal nodule slightly larger, I will present case at conference and decide need for PET

## 2015-08-05 NOTE — Telephone Encounter (Signed)
Called pt with CT result: Peri-rectal nodule slightly larger, Dr. Benay Spice will present case at conference and decide need for PET. Pt voiced understanding.

## 2015-08-05 NOTE — Patient Outreach (Signed)
TOC outreach attempted. Call to patient preferred number, no answer, left VM with RNCM contact information. Plan await call from patient, if no return call will attempt again later. [name]. [name], RN, BSN, [place] [phone]

## 2015-08-05 NOTE — Progress Notes (Signed)
CC'ED TO PCP 

## 2015-08-06 ENCOUNTER — Encounter: Payer: Self-pay | Admitting: Genetic Counselor

## 2015-08-06 ENCOUNTER — Other Ambulatory Visit: Payer: Self-pay | Admitting: *Deleted

## 2015-08-06 NOTE — Patient Outreach (Signed)
TOC call week #1 Call attempted to number given by patient. No answer, left VM with RNCM contact information. Plan to await call back, will call again next week if no return call. [name]. [name], RN, BSN, [place] [phone]

## 2015-08-06 NOTE — Telephone Encounter (Signed)
Opened in error at GI Conference review.

## 2015-08-07 ENCOUNTER — Ambulatory Visit (HOSPITAL_COMMUNITY)
Admit: 2015-08-07 | Discharge: 2015-08-07 | Disposition: A | Discharge status: Home / Self Care | Payer: Medicare Other | Source: Ambulatory Visit | Attending: Internal Medicine | Admitting: Internal Medicine

## 2015-08-07 ENCOUNTER — Encounter (HOSPITAL_COMMUNITY): Payer: Self-pay

## 2015-08-07 VITALS — BP 98/62 | HR 91 | Wt 204.4 lb

## 2015-08-07 DIAGNOSIS — E785 Hyperlipidemia, unspecified: Secondary | ICD-10-CM | POA: Diagnosis not present

## 2015-08-07 DIAGNOSIS — Z79899 Other long term (current) drug therapy: Secondary | ICD-10-CM | POA: Diagnosis not present

## 2015-08-07 DIAGNOSIS — E1122 Type 2 diabetes mellitus with diabetic chronic kidney disease: Secondary | ICD-10-CM | POA: Insufficient documentation

## 2015-08-07 DIAGNOSIS — Z8673 Personal history of transient ischemic attack (TIA), and cerebral infarction without residual deficits: Secondary | ICD-10-CM | POA: Diagnosis not present

## 2015-08-07 DIAGNOSIS — N184 Chronic kidney disease, stage 4 (severe): Secondary | ICD-10-CM

## 2015-08-07 DIAGNOSIS — Z8701 Personal history of pneumonia (recurrent): Secondary | ICD-10-CM | POA: Insufficient documentation

## 2015-08-07 DIAGNOSIS — Z9221 Personal history of antineoplastic chemotherapy: Secondary | ICD-10-CM | POA: Diagnosis not present

## 2015-08-07 DIAGNOSIS — Z955 Presence of coronary angioplasty implant and graft: Secondary | ICD-10-CM | POA: Diagnosis not present

## 2015-08-07 DIAGNOSIS — G4733 Obstructive sleep apnea (adult) (pediatric): Secondary | ICD-10-CM | POA: Diagnosis not present

## 2015-08-07 DIAGNOSIS — I255 Ischemic cardiomyopathy: Secondary | ICD-10-CM | POA: Insufficient documentation

## 2015-08-07 DIAGNOSIS — K219 Gastro-esophageal reflux disease without esophagitis: Secondary | ICD-10-CM | POA: Diagnosis not present

## 2015-08-07 DIAGNOSIS — Z923 Personal history of irradiation: Secondary | ICD-10-CM | POA: Insufficient documentation

## 2015-08-07 DIAGNOSIS — Z794 Long term (current) use of insulin: Secondary | ICD-10-CM | POA: Insufficient documentation

## 2015-08-07 DIAGNOSIS — Z7902 Long term (current) use of antithrombotics/antiplatelets: Secondary | ICD-10-CM | POA: Insufficient documentation

## 2015-08-07 DIAGNOSIS — I482 Chronic atrial fibrillation, unspecified: Secondary | ICD-10-CM | POA: Insufficient documentation

## 2015-08-07 DIAGNOSIS — I129 Hypertensive chronic kidney disease with stage 1 through stage 4 chronic kidney disease, or unspecified chronic kidney disease: Secondary | ICD-10-CM | POA: Insufficient documentation

## 2015-08-07 DIAGNOSIS — Z85048 Personal history of other malignant neoplasm of rectum, rectosigmoid junction, and anus: Secondary | ICD-10-CM | POA: Insufficient documentation

## 2015-08-07 DIAGNOSIS — Z8249 Family history of ischemic heart disease and other diseases of the circulatory system: Secondary | ICD-10-CM | POA: Insufficient documentation

## 2015-08-07 DIAGNOSIS — I480 Paroxysmal atrial fibrillation: Secondary | ICD-10-CM | POA: Insufficient documentation

## 2015-08-07 DIAGNOSIS — Z8546 Personal history of malignant neoplasm of prostate: Secondary | ICD-10-CM | POA: Diagnosis not present

## 2015-08-07 DIAGNOSIS — I2510 Atherosclerotic heart disease of native coronary artery without angina pectoris: Secondary | ICD-10-CM | POA: Diagnosis not present

## 2015-08-07 DIAGNOSIS — I5022 Chronic systolic (congestive) heart failure: Secondary | ICD-10-CM | POA: Diagnosis not present

## 2015-08-07 DIAGNOSIS — N189 Chronic kidney disease, unspecified: Secondary | ICD-10-CM | POA: Diagnosis not present

## 2015-08-07 NOTE — Progress Notes (Signed)
Patient ID: [name], male   DOB: [birth year], 59 y.o.   MRN: [id number]  PCP: Dr. Gerarda Fraction Oncologist; Dr Benay Spice.   59 yo with history of CAD and ischemic cardiomyopathy, BiV ICD upgrade 2009, paroxysmal atrial fibrillation, CKD, and traumatic SAH in 1/16 after a fall.  He has been followed at the heart failure clinic at [facility] by Dr Carolynn Serve in the past.    He was admitted in 3/16 from the [facility] ER with exertional dyspnea/volume overload and had a cardiac arrest/ventricular fibrillation while in the hospital terminated by his ICD.  After diuresis, RHC showed relatively normal filling pressures and preserved cardiac index.  Creatinine peaked at 3.0. (was 5.0 in 1/16)Echo in 3/16 showed EF 25-30% with restrictive diastolic function.  Cardiolite was done, showing areas of scar but no ischemia.  EF 18%.    Admitted 4/16 with hematuria and elevated LFTs. Questionable amiodarone toxicity and this was stopped.   In 5/16 Underwent elective BiV ICD generator change which went without complication. He was admitted again 04/23/15 with acute on chronic CHF and respiratory failure. During thatadmission he ruled in for a NSTEMI- Troponin 4.29. He has chronic stage 4 CRI and it was decided to obtain a Myoview before considering a cath. Myoview done 04/26/15 showed EF 20% .Large region of prior infarct involving the apical anterior, anteroseptal, inferoseptal and inferior walls with extension to the true apex. Small region of reversible/inducible ischemia along the margin of the prior infarct at the mid ventricular anterior and anteroseptal walls. No change from previous. Also during that hospitalization he was seen in consult by the GI service. Dr Carlean Purl felt we could resume his Amiodarone with plans to follow his LFTs. BP very low in hospital  Enalapril switched to bidil. Toprol 25 stopped.   Admitted to [facility] for increased dyspnea and volume overload. Diuresed with IV lasix and transitioned to 40 mg daily.  D/C weight  200 pounds.  He returns for post hospital follow up. Having dyspnea with exertion. + Cough.  Weight at home 199-200 pounds. No dizziness. Following with nephrology now; Dr. Hinda Lenis. At home BP in 90s. Appetite fair. Taking all medications.    Echo 5/16 EF 35%  Labs (3/16): K 3.8, creatinine 2.78 Labs (03/10/2015): K 4.5 Creatinine 2.49  TSH 4.58, LFTs normal, digoxin 0.8 Labs (04/26/15): K 3.5 creatinine 2.13 Labs (08/04/2015): K 3.3 Creatinine 3.01   PMH: 1. HTN 2. Type II diabetes 3. CAD: s/p BMS LAD in 2001, PTCA ramus and BMS LAD in 2009.  Cardiolite (3/16) with EF 18%, no ischemia, prior anterior, apical and inferior infarction.  4. Chronic systolic CHF: Ischemic CMP.  St Jude CRT-D.  Echo (3/16) with EF 25-30%, restrictive diastolic function, mild LVH, mild MR.  RHC (3/16) with mean RA 9, PA 47/29, mean PCWP 16, CI 2.5 (Fick).  Suspect ACEI cough.  5. SAH: 1/16 after fall (traumatic).   6. GERD 7. Atrial fibrillation: Paroxysmal, on amiodarone. 8. Rectal cancer: s/p surgery. Has colostomy.  9. Prostate cancer: s/p chemo/radiation.  10. H/o TIA 11. OSA: On CPAP.  12. Hyperlipidemia 13. CKD 14. CT of abdomen- Concern for R perirectal recurrence.   SH: Married, lives in [place], nonsmoker.   FH: CAD  ROS: All systems reviewed and negative except as per HPI.   Current Outpatient Prescriptions  Medication Sig Dispense Refill  . apixaban (ELIQUIS) 5 MG TABS tablet Take 5 mg by mouth 2 (two) times daily.    Marland Kitchen BIDIL 20-37.5 MG  per tablet Take 1 tablet by mouth 3 (three) times daily.     Marland Kitchen co-enzyme Q-10 50 MG capsule Take 50 mg by mouth every morning.     . ferrous sulfate 325 (65 FE) MG tablet Take 1 tablet (325 mg total) by mouth 2 (two) times daily with a meal. 60 tablet 0  . fluticasone (FLONASE) 50 MCG/ACT nasal spray Place 1 spray into both nostrils daily as needed for allergies.     . furosemide (LASIX) 40 MG tablet Take 1 tablet (40 mg total) by mouth daily.  Take an additional pill with an additional potassium pill if you gain over 2 pounds in 24 hours. 30 tablet 1  . glimepiride (AMARYL) 1 MG tablet Take 1 mg by mouth daily with breakfast.    . Insulin Glargine (TOUJEO SOLOSTAR [place]) Inject 30 Units into the skin every morning.    . isosorbide dinitrate (ISORDIL) 30 MG tablet Take 0.5 tablets by mouth 2 (two) times daily.  0  . metoprolol succinate (TOPROL-XL) 25 MG 24 hr tablet Take 25 mg by mouth 2 (two) times daily.   0  . Omega-3 Fatty Acids (FISH OIL) 1000 MG CAPS Take 1,000 mg by mouth 2 (two) times daily.    . pantoprazole (PROTONIX) 40 MG tablet Take 1 tablet (40 mg total) by mouth daily. 30 tablet 0  . rosuvastatin (CRESTOR) 40 MG tablet Take 1 tablet (40 mg total) by mouth daily at 6 PM. (Patient taking differently: Take 40 mg by mouth at bedtime. ) 90 tablet 11  . nitroGLYCERIN (NITROLINGUAL) 0.4 MG/SPRAY spray Place 1 spray under the tongue every 5 (five) minutes as needed. angina (Patient not taking: Reported on 08/07/2015) 12 g 3  . potassium chloride SA (K-DUR,KLOR-CON) 20 MEQ tablet Take 1 tablet (20 mEq total) by mouth daily. (Patient not taking: Reported on 08/07/2015) 30 tablet 1   No current facility-administered medications for this encounter.    BP 98/62 mmHg  Pulse 91  Wt 204 lb 6 oz (92.704 kg)  SpO2 99%    General: NAD Ambulated in the clinic without difficutly.  Neck: JVP ~10  no thyromegaly or thyroid nodule.  Lungs: Clear to auscultation bilaterally with normal respiratory effort. CV: Nondisplaced PMI.  IRR, IRR 2/6 SEM RUSB.  No edema.  No carotid bruit.  Normal pedal pulses.  Abdomen: Soft, nontender, no hepatosplenomegaly, no distention. S/p colostomy.  Skin: Intact without lesions or rashes.  Neurologic: Alert and oriented x 3.  Psych: Normal affect. Extremities: No clubbing or cyanosis. R and LLE 2+ edema.  HEENT: Normal.     Assessment/Plan: 1. Chronic systolic CHF: Ischemic cardiomyopathy.  Has been  followed for a long time at [facility].  Deemed not transplant or VAD candidate with comorbities and advanced CKD. NYHA III. BiV St Jude. Narrow euvolemic window. Needs weight to be less than 198 pounds. Discharged earlier this week and today on exam his volume status is up again.   NYHA III. Volume status elevated. Increase lasix to 40 mg twice a day for 4 days then back to 40 mg daily.  - Continue Toprol 25 bid. - Given CKD and previous cancer not good VAD or Tx candidate (would need heart/kidney)  2. Atrial fibrillation: Chronic. CHADS2Vasc Score 5. Off ranexa and amio last visit due to ongoing A fib. Remains on Eliquis continued. Had traumatic SAH in 1/16 but stable back on Eliquis.   - Continue amio to 200 mg daily.  Continue Toprol 25 bid.  3. CAD: Recent NSTEMI. Stress test without high ischemic burden. No cath due to renal failure. Continue medical therapy. He is on ASA 81 daily and statin. Angina controlled with isordil.  4. CKD: Stable creatinine most recently.   Sees Dr Lowanda Foster in [place].  5. Ventricular fibrillation: s/p recent generator change. Off amio.  6. DM2 - followed by Dr. Gerarda Fraction 7. H/O rectal cancer 2008- Has diverting colostomy. CT of abdomen concerned for perirectal recurrence. He has follow up with Dr Benay Spice. 8. H/O Pneumonia- Treated for pneumona early in August. CXR 8/25 resolving pneumonia noted.f   Follow up next week. He remains at high risk for readmit. Need to try and get fluid a little better.    CLEGG,AMY NP-C  08/07/2015

## 2015-08-07 NOTE — Patient Instructions (Addendum)
INCREASE Lasix to 40 mg TWICE daily for FOUR DAYS, then reduce back to normal dose of 40 mg once daily.  Take Potassium 20 meq tablet once daily.  Follow up 1 week.  Do the following things EVERYDAY: 1) Weigh yourself in the morning before breakfast. Write it down and keep it in a log. 2) Take your medicines as prescribed 3) Eat low salt foods-Limit salt (sodium) to 2000 mg per day.  4) Stay as active as you can everyday 5) Limit all fluids for the day to less than 2 liters

## 2015-08-08 ENCOUNTER — Other Ambulatory Visit (HOSPITAL_COMMUNITY): Payer: Self-pay | Admitting: Internal Medicine

## 2015-08-08 ENCOUNTER — Telehealth: Payer: Self-pay | Admitting: Nutrition

## 2015-08-08 ENCOUNTER — Ambulatory Visit: Payer: Medicare Other | Admitting: Nutrition

## 2015-08-08 NOTE — Telephone Encounter (Signed)
Vm to return call and reschedule missed appointment. PC 

## 2015-08-12 ENCOUNTER — Other Ambulatory Visit: Payer: Self-pay | Admitting: *Deleted

## 2015-08-12 NOTE — Patient Outreach (Signed)
TOC week #2 outreach call. Call to patient Spoke with patient for Transition of Care program.  Subjective: Patient reports he is feeling better, he denies any chest pain or SOB. Patient states he was inpatient because he had chest pain, they found his potassium was low and that his pneumonia was not resolved. He states he just finished his antibiotic for his pneumonia. Patient has follow up appointment with his Cardiologist on Thursday, he also has appointment with primary care coming up soon.  Patient able to manage his medications, able to review with [facility] and knows about medications. Patient reporting weight is stable at 197#. Patient lives with wife but is independent and able to drive.  Assessment: Reviewed medications with patient Instructed patient on calling HF clinic for increased weight to get instructions on treatment Instructed patient to be sure to let primary care MD know about blood sugar readings and how he is taking his insulin.  Plan: Will fax MD note Will call again next week to continue TOC calls, plan for outreach visit later in month. [name]. [name], RN, BSN, [place] [phone]

## 2015-08-13 ENCOUNTER — Telehealth: Payer: Self-pay | Admitting: Nutrition

## 2015-08-13 ENCOUNTER — Encounter: Payer: Self-pay | Admitting: *Deleted

## 2015-08-13 DIAGNOSIS — Z1389 Encounter for screening for other disorder: Secondary | ICD-10-CM | POA: Diagnosis not present

## 2015-08-13 DIAGNOSIS — E6609 Other obesity due to excess calories: Secondary | ICD-10-CM | POA: Diagnosis not present

## 2015-08-13 DIAGNOSIS — E1129 Type 2 diabetes mellitus with other diabetic kidney complication: Secondary | ICD-10-CM | POA: Diagnosis not present

## 2015-08-13 DIAGNOSIS — Z6831 Body mass index (BMI) 31.0-31.9, adult: Secondary | ICD-10-CM | POA: Diagnosis not present

## 2015-08-13 DIAGNOSIS — E119 Type 2 diabetes mellitus without complications: Secondary | ICD-10-CM | POA: Diagnosis not present

## 2015-08-13 DIAGNOSIS — I428 Other cardiomyopathies: Secondary | ICD-10-CM | POA: Diagnosis not present

## 2015-08-13 NOTE — Telephone Encounter (Signed)
VM to call and reschedule missed appointment. PC 

## 2015-08-14 ENCOUNTER — Encounter (HOSPITAL_COMMUNITY): Payer: Self-pay

## 2015-08-14 ENCOUNTER — Ambulatory Visit (HOSPITAL_COMMUNITY)
Admission: RE | Admit: 2015-08-14 | Discharge: 2015-08-14 | Disposition: A | Discharge status: Home / Self Care | Payer: Medicare Other | Source: Ambulatory Visit | Attending: Internal Medicine | Admitting: Internal Medicine

## 2015-08-14 VITALS — BP 112/58 | HR 90 | Wt 205.1 lb

## 2015-08-14 DIAGNOSIS — K219 Gastro-esophageal reflux disease without esophagitis: Secondary | ICD-10-CM | POA: Insufficient documentation

## 2015-08-14 DIAGNOSIS — I129 Hypertensive chronic kidney disease with stage 1 through stage 4 chronic kidney disease, or unspecified chronic kidney disease: Secondary | ICD-10-CM | POA: Insufficient documentation

## 2015-08-14 DIAGNOSIS — Z8701 Personal history of pneumonia (recurrent): Secondary | ICD-10-CM | POA: Diagnosis not present

## 2015-08-14 DIAGNOSIS — Z8673 Personal history of transient ischemic attack (TIA), and cerebral infarction without residual deficits: Secondary | ICD-10-CM | POA: Diagnosis not present

## 2015-08-14 DIAGNOSIS — Z8546 Personal history of malignant neoplasm of prostate: Secondary | ICD-10-CM | POA: Insufficient documentation

## 2015-08-14 DIAGNOSIS — N184 Chronic kidney disease, stage 4 (severe): Secondary | ICD-10-CM | POA: Diagnosis not present

## 2015-08-14 DIAGNOSIS — E1122 Type 2 diabetes mellitus with diabetic chronic kidney disease: Secondary | ICD-10-CM | POA: Diagnosis not present

## 2015-08-14 DIAGNOSIS — I5022 Chronic systolic (congestive) heart failure: Secondary | ICD-10-CM | POA: Diagnosis not present

## 2015-08-14 DIAGNOSIS — I255 Ischemic cardiomyopathy: Secondary | ICD-10-CM | POA: Diagnosis not present

## 2015-08-14 DIAGNOSIS — Z8249 Family history of ischemic heart disease and other diseases of the circulatory system: Secondary | ICD-10-CM | POA: Insufficient documentation

## 2015-08-14 DIAGNOSIS — Z85048 Personal history of other malignant neoplasm of rectum, rectosigmoid junction, and anus: Secondary | ICD-10-CM | POA: Diagnosis not present

## 2015-08-14 DIAGNOSIS — N189 Chronic kidney disease, unspecified: Secondary | ICD-10-CM | POA: Diagnosis not present

## 2015-08-14 DIAGNOSIS — Z955 Presence of coronary angioplasty implant and graft: Secondary | ICD-10-CM | POA: Diagnosis not present

## 2015-08-14 DIAGNOSIS — E785 Hyperlipidemia, unspecified: Secondary | ICD-10-CM | POA: Diagnosis not present

## 2015-08-14 DIAGNOSIS — Z794 Long term (current) use of insulin: Secondary | ICD-10-CM | POA: Insufficient documentation

## 2015-08-14 DIAGNOSIS — Z7902 Long term (current) use of antithrombotics/antiplatelets: Secondary | ICD-10-CM | POA: Diagnosis not present

## 2015-08-14 DIAGNOSIS — Z7982 Long term (current) use of aspirin: Secondary | ICD-10-CM | POA: Insufficient documentation

## 2015-08-14 DIAGNOSIS — I252 Old myocardial infarction: Secondary | ICD-10-CM | POA: Insufficient documentation

## 2015-08-14 DIAGNOSIS — G4733 Obstructive sleep apnea (adult) (pediatric): Secondary | ICD-10-CM | POA: Insufficient documentation

## 2015-08-14 DIAGNOSIS — I482 Chronic atrial fibrillation, unspecified: Secondary | ICD-10-CM

## 2015-08-14 DIAGNOSIS — I509 Heart failure, unspecified: Secondary | ICD-10-CM | POA: Diagnosis not present

## 2015-08-14 LAB — BASIC METABOLIC PANEL
ANION GAP: 8 (ref 5–15)
BUN: 51 mg/dL — ABNORMAL HIGH (ref 6–20)
CHLORIDE: 107 mmol/L (ref 101–111)
CO2: 22 mmol/L (ref 22–32)
Calcium: 9.4 mg/dL (ref 8.9–10.3)
Creatinine, Ser: 4.14 mg/dL — ABNORMAL HIGH (ref 0.61–1.24)
GFR calc non Af Amer: 14 mL/min — ABNORMAL LOW (ref >60)
GFR, EST AFRICAN AMERICAN: 17 mL/min — AB (ref >60)
Glucose, Bld: 83 mg/dL (ref 65–99)
POTASSIUM: 4.3 mmol/L (ref 3.5–5.1)
SODIUM: 137 mmol/L (ref 135–145)

## 2015-08-14 NOTE — Progress Notes (Signed)
Patient ID: [name], male   DOB: Sep 22, 1956, 59 y.o.   MRN: [id number]  PCP: Dr. Gerarda Fraction Oncologist; Dr Benay Spice.   59 yo with history of CAD and ischemic cardiomyopathy, BiV ICD upgrade 2009, paroxysmal atrial fibrillation, CKD, and traumatic SAH in 1/16 after a fall.  He has been followed at the heart failure clinic at [facility] by Dr Carolynn Serve in the past.    He was admitted in 3/16 from the [facility] ER with exertional dyspnea/volume overload and had a cardiac arrest/ventricular fibrillation while in the hospital terminated by his ICD.  After diuresis, RHC showed relatively normal filling pressures and preserved cardiac index.  Creatinine peaked at 3.0. (was 5.0 in 1/16)Echo in 3/16 showed EF 25-30% with restrictive diastolic function.  Cardiolite was done, showing areas of scar but no ischemia.  EF 18%.    Admitted 4/16 with hematuria and elevated LFTs. Questionable amiodarone toxicity and this was stopped.   In 5/16 Underwent elective BiV ICD generator change which went without complication. He was admitted again 04/23/15 with acute on chronic CHF and respiratory failure. During thatadmission he ruled in for a NSTEMI- Troponin 4.29. He has chronic stage 4 CRI and it was decided to obtain a Myoview before considering a cath. Myoview done 04/26/15 showed EF 20% .Large region of prior infarct involving the apical anterior, anteroseptal, inferoseptal and inferior walls with extension to the true apex. Small region of reversible/inducible ischemia along the margin of the prior infarct at the mid ventricular anterior and anteroseptal walls. No change from previous. Also during that hospitalization he was seen in consult by the GI service. Dr Carlean Purl felt we could resume his Amiodarone with plans to follow his LFTs. BP very low in hospital  Enalapril switched to bidil. Toprol 25 stopped.   Admitted to [facility] for increased dyspnea and volume overload. Diuresed with IV lasix and transitioned to 40 mg daily.  D/C weight  200 pounds.  He returns for  follow up. Overall feeling much better. Denies SOB/PND/Orthopnea. Cough much improved.  Weight at home 197-199. No dizziness. Following with nephrology now; Dr. Hinda Lenis. At home BP in 90s. Appetite improved. Taking all medications.    Echo 5/16 EF 35%  Labs (3/16): K 3.8, creatinine 2.78 Labs (03/10/2015): K 4.5 Creatinine 2.49  TSH 4.58, LFTs normal, digoxin 0.8 Labs (04/26/15): K 3.5 creatinine 2.13 Labs (08/04/2015): K 3.3 Creatinine 3.01   PMH: 1. HTN 2. Type II diabetes 3. CAD: s/p BMS LAD in 2001, PTCA ramus and BMS LAD in 2009.  Cardiolite (3/16) with EF 18%, no ischemia, prior anterior, apical and inferior infarction.  4. Chronic systolic CHF: Ischemic CMP.  St Jude CRT-D.  Echo (3/16) with EF 25-30%, restrictive diastolic function, mild LVH, mild MR.  RHC (3/16) with mean RA 9, PA 47/29, mean PCWP 16, CI 2.5 (Fick).  Suspect ACEI cough.  5. SAH: 1/16 after fall (traumatic).   6. GERD 7. Atrial fibrillation: Paroxysmal, on amiodarone. 8. Rectal cancer: s/p surgery. Has colostomy.  9. Prostate cancer: s/p chemo/radiation.  10. H/o TIA 11. OSA: On CPAP.  12. Hyperlipidemia 13. CKD 14. CT of abdomen- Concern for R perirectal recurrence.   SH: Married, lives in [place], nonsmoker.   FH: CAD  ROS: All systems reviewed and negative except as per HPI.   Current Outpatient Prescriptions  Medication Sig Dispense Refill  . apixaban (ELIQUIS) 5 MG TABS tablet Take 5 mg by mouth 2 (two) times daily.    Marland Kitchen BIDIL 20-37.5  MG per tablet Take 0.5 tablets by mouth 3 (three) times daily.     Marland Kitchen co-enzyme Q-10 50 MG capsule Take 50 mg by mouth every morning.     . ferrous sulfate 325 (65 FE) MG tablet Take 1 tablet (325 mg total) by mouth 2 (two) times daily with a meal. 60 tablet 0  . fluticasone (FLONASE) 50 MCG/ACT nasal spray Place 1 spray into both nostrils daily as needed for allergies.     . furosemide (LASIX) 40 MG tablet Take 1 tablet (40 mg  total) by mouth daily. Take an additional pill with an additional potassium pill if you gain over 2 pounds in 24 hours. 30 tablet 1  . glimepiride (AMARYL) 1 MG tablet Take 1 mg by mouth daily with breakfast.    . Insulin Glargine (TOUJEO SOLOSTAR [place]) Inject 30 Units into the skin every morning.    . isosorbide dinitrate (ISORDIL) 30 MG tablet Take 0.5 tablets by mouth 2 (two) times daily.  0  . metoprolol succinate (TOPROL-XL) 25 MG 24 hr tablet Take 25 mg by mouth 2 (two) times daily.   0  . Omega-3 Fatty Acids (FISH OIL) 1000 MG CAPS Take 1,000 mg by mouth 2 (two) times daily.    . pantoprazole (PROTONIX) 40 MG tablet Take 1 tablet (40 mg total) by mouth daily. 30 tablet 0  . potassium chloride SA (K-DUR,KLOR-CON) 20 MEQ tablet Take 1 tablet (20 mEq total) by mouth daily. 30 tablet 1  . rosuvastatin (CRESTOR) 40 MG tablet Take 40 mg by mouth at bedtime.    . nitroGLYCERIN (NITROLINGUAL) 0.4 MG/SPRAY spray Place 1 spray under the tongue every 5 (five) minutes as needed. angina (Patient not taking: Reported on 08/14/2015) 12 g 3   No current facility-administered medications for this encounter.    BP 112/58 mmHg  Pulse 90  Wt 205 lb 2 oz (93.044 kg)  SpO2 99%    General: NAD Ambulated in the clinic without difficutly.  Neck: JVP flat.   no thyromegaly or thyroid nodule.  Lungs: Clear to auscultation bilaterally with normal respiratory effort. CV: Nondisplaced PMI.  IRR, IRR 2/6 SEM RUSB.  No edema.  No carotid bruit.  Normal pedal pulses.  Abdomen: Soft, nontender, no hepatosplenomegaly, no distention. S/p colostomy.  Skin: Intact without lesions or rashes.  Neurologic: Alert and oriented x 3.  Psych: Normal affect. Extremities: No clubbing or cyanosis. R and LLE trace edema.  HEENT: Normal.     Assessment/Plan: 1. Chronic systolic CHF: Ischemic cardiomyopathy.  Has been followed for a long time at [facility].  Deemed not transplant or VAD candidate with comorbities and advanced CKD. BiV  St Jude.  NYHA II much improved. Needs weight at home less than 198 pounds.   . Volume status stable. Continue lasix 40 mg daily.  - Continue Toprol 25 bid.  - Given CKD and previous cancer not good VAD or Tx candidate (would need heart/kidney)  Check BMET today.  2. Atrial fibrillation: Chronic. CHADS2Vasc Score 5. Off ranexa and amio last visit due to ongoing A fib. Remains on Eliquis continued. Had traumatic SAH in 1/16 but stable back on Eliquis.   - Continue amio to 200 mg daily.  Continue Toprol 25 bid.  3. CAD: Recent NSTEMI. Stress test without high ischemic burden. No cath due to renal failure. Continue medical therapy. He is on ASA 81 daily and statin. Angina controlled with isordil.  4. CKD: Check BMET today. Followed by Dr Lowanda Foster in [place].  5. Ventricular fibrillation: s/p recent generator change. Off amio.  6. DM2 - followed by Dr. Gerarda Fraction 7. H/O rectal cancer 2008- Has diverting colostomy. CT of abdomen concerned for perirectal recurrence. He has follow up with Dr Benay Spice. 8. H/O Pneumonia- Treated for pneumona early in August. CXR 8/25 resolving pneumonia noted on CXR.  Follow up 6 weeks with Dr Haroldine Laws.   CLEGG,AMY NP-C  08/14/2015

## 2015-08-14 NOTE — Patient Instructions (Signed)
Follow up 6  weeks with Dr Bensimhon   Do the following things EVERYDAY: 1) Weigh yourself in the morning before breakfast. Write it down and keep it in a log. 2) Take your medicines as prescribed 3) Eat low salt foods-Limit salt (sodium) to 2000 mg per day.  4) Stay as active as you can everyday 5) Limit all fluids for the day to less than 2 liters 

## 2015-08-14 NOTE — Progress Notes (Signed)
Advanced Heart Failure Medication Review by a Pharmacist  Does the patient  feel that his/her medications are working for him/her?  yes  Has the patient been experiencing any side effects to the medications prescribed?  no  Does the patient measure his/her own blood pressure or blood glucose at home?  yes   Does the patient have any problems obtaining medications due to transportation or finances?   no  Understanding of regimen: good Understanding of indications: good Potential of compliance: good    Pharmacist comments:  [name] is a pleasant 60 yo M presenting with an up-to-date medication list. He seems to have a good understanding of his regimen and the importance of consistent use. He states that at his appointment last week he was told to take lasix 40 mg BID x 4 days and since Monday has been taking 40 mg daily again with noticeable improvement in lower extremity edema. He did not have any other medication-related questions or concerns for me today.   [name] Velva Harman, PharmD, BCPS, CPP Clinical Pharmacist Pager: [phone] Phone: [phone] 08/14/2015 10:34 AM

## 2015-08-15 DIAGNOSIS — I10 Essential (primary) hypertension: Secondary | ICD-10-CM | POA: Diagnosis not present

## 2015-08-15 DIAGNOSIS — D509 Iron deficiency anemia, unspecified: Secondary | ICD-10-CM | POA: Diagnosis not present

## 2015-08-15 DIAGNOSIS — Z79899 Other long term (current) drug therapy: Secondary | ICD-10-CM | POA: Diagnosis not present

## 2015-08-15 DIAGNOSIS — D649 Anemia, unspecified: Secondary | ICD-10-CM | POA: Diagnosis not present

## 2015-08-15 DIAGNOSIS — R809 Proteinuria, unspecified: Secondary | ICD-10-CM | POA: Diagnosis not present

## 2015-08-15 DIAGNOSIS — N183 Chronic kidney disease, stage 3 (moderate): Secondary | ICD-10-CM | POA: Diagnosis not present

## 2015-08-15 DIAGNOSIS — E559 Vitamin D deficiency, unspecified: Secondary | ICD-10-CM | POA: Diagnosis not present

## 2015-08-18 ENCOUNTER — Other Ambulatory Visit: Payer: Self-pay | Admitting: *Deleted

## 2015-08-18 NOTE — Patient Outreach (Signed)
Transition of Care call Week #3 Call to patient home as scheduled. No answer, left VM with RNCM contact information. Plan to await call back, Will call again later if no return response to message. [name]. [name], RN, BSN, [place] [phone]

## 2015-08-19 DIAGNOSIS — D638 Anemia in other chronic diseases classified elsewhere: Secondary | ICD-10-CM | POA: Diagnosis not present

## 2015-08-19 DIAGNOSIS — N2581 Secondary hyperparathyroidism of renal origin: Secondary | ICD-10-CM | POA: Diagnosis not present

## 2015-08-19 DIAGNOSIS — N185 Chronic kidney disease, stage 5: Secondary | ICD-10-CM | POA: Diagnosis not present

## 2015-08-19 DIAGNOSIS — R809 Proteinuria, unspecified: Secondary | ICD-10-CM | POA: Diagnosis not present

## 2015-08-20 ENCOUNTER — Other Ambulatory Visit (HOSPITAL_COMMUNITY): Payer: Self-pay | Admitting: *Deleted

## 2015-08-23 ENCOUNTER — Emergency Department (HOSPITAL_COMMUNITY): Payer: Medicare Other

## 2015-08-23 ENCOUNTER — Inpatient Hospital Stay (HOSPITAL_COMMUNITY)
Admission: EM | Admit: 2015-08-23 | Discharge: 2015-08-30 | DRG: 393 | Disposition: A | Discharge status: Home / Self Care | Payer: Medicare Other | Attending: Internal Medicine | Admitting: Internal Medicine

## 2015-08-23 ENCOUNTER — Encounter (HOSPITAL_COMMUNITY): Payer: Self-pay

## 2015-08-23 DIAGNOSIS — Z7901 Long term (current) use of anticoagulants: Secondary | ICD-10-CM | POA: Diagnosis not present

## 2015-08-23 DIAGNOSIS — Q2733 Arteriovenous malformation of digestive system vessel: Secondary | ICD-10-CM | POA: Diagnosis not present

## 2015-08-23 DIAGNOSIS — Z9221 Personal history of antineoplastic chemotherapy: Secondary | ICD-10-CM

## 2015-08-23 DIAGNOSIS — E785 Hyperlipidemia, unspecified: Secondary | ICD-10-CM | POA: Diagnosis present

## 2015-08-23 DIAGNOSIS — E876 Hypokalemia: Secondary | ICD-10-CM | POA: Diagnosis not present

## 2015-08-23 DIAGNOSIS — I5023 Acute on chronic systolic (congestive) heart failure: Secondary | ICD-10-CM | POA: Diagnosis not present

## 2015-08-23 DIAGNOSIS — Z923 Personal history of irradiation: Secondary | ICD-10-CM

## 2015-08-23 DIAGNOSIS — E1122 Type 2 diabetes mellitus with diabetic chronic kidney disease: Secondary | ICD-10-CM | POA: Diagnosis present

## 2015-08-23 DIAGNOSIS — Z95810 Presence of automatic (implantable) cardiac defibrillator: Secondary | ICD-10-CM | POA: Diagnosis not present

## 2015-08-23 DIAGNOSIS — I5021 Acute systolic (congestive) heart failure: Secondary | ICD-10-CM | POA: Diagnosis not present

## 2015-08-23 DIAGNOSIS — Z950 Presence of cardiac pacemaker: Secondary | ICD-10-CM

## 2015-08-23 DIAGNOSIS — K635 Polyp of colon: Secondary | ICD-10-CM | POA: Diagnosis present

## 2015-08-23 DIAGNOSIS — N184 Chronic kidney disease, stage 4 (severe): Secondary | ICD-10-CM | POA: Diagnosis present

## 2015-08-23 DIAGNOSIS — E1129 Type 2 diabetes mellitus with other diabetic kidney complication: Secondary | ICD-10-CM | POA: Diagnosis not present

## 2015-08-23 DIAGNOSIS — I5022 Chronic systolic (congestive) heart failure: Secondary | ICD-10-CM | POA: Diagnosis present

## 2015-08-23 DIAGNOSIS — I480 Paroxysmal atrial fibrillation: Secondary | ICD-10-CM | POA: Diagnosis present

## 2015-08-23 DIAGNOSIS — Z8673 Personal history of transient ischemic attack (TIA), and cerebral infarction without residual deficits: Secondary | ICD-10-CM | POA: Diagnosis not present

## 2015-08-23 DIAGNOSIS — R140 Abdominal distension (gaseous): Secondary | ICD-10-CM | POA: Diagnosis not present

## 2015-08-23 DIAGNOSIS — D631 Anemia in chronic kidney disease: Secondary | ICD-10-CM | POA: Diagnosis not present

## 2015-08-23 DIAGNOSIS — I255 Ischemic cardiomyopathy: Secondary | ICD-10-CM | POA: Diagnosis not present

## 2015-08-23 DIAGNOSIS — K921 Melena: Secondary | ICD-10-CM | POA: Diagnosis present

## 2015-08-23 DIAGNOSIS — Z9181 History of falling: Secondary | ICD-10-CM

## 2015-08-23 DIAGNOSIS — K9401 Colostomy hemorrhage: Principal | ICD-10-CM | POA: Diagnosis present

## 2015-08-23 DIAGNOSIS — K922 Gastrointestinal hemorrhage, unspecified: Secondary | ICD-10-CM | POA: Diagnosis not present

## 2015-08-23 DIAGNOSIS — E1165 Type 2 diabetes mellitus with hyperglycemia: Secondary | ICD-10-CM | POA: Diagnosis present

## 2015-08-23 DIAGNOSIS — T502X5A Adverse effect of carbonic-anhydrase inhibitors, benzothiadiazides and other diuretics, initial encounter: Secondary | ICD-10-CM | POA: Diagnosis not present

## 2015-08-23 DIAGNOSIS — E669 Obesity, unspecified: Secondary | ICD-10-CM | POA: Diagnosis present

## 2015-08-23 DIAGNOSIS — IMO0002 Reserved for concepts with insufficient information to code with codable children: Secondary | ICD-10-CM | POA: Diagnosis present

## 2015-08-23 DIAGNOSIS — G4733 Obstructive sleep apnea (adult) (pediatric): Secondary | ICD-10-CM | POA: Diagnosis present

## 2015-08-23 DIAGNOSIS — I2510 Atherosclerotic heart disease of native coronary artery without angina pectoris: Secondary | ICD-10-CM

## 2015-08-23 DIAGNOSIS — I129 Hypertensive chronic kidney disease with stage 1 through stage 4 chronic kidney disease, or unspecified chronic kidney disease: Secondary | ICD-10-CM | POA: Diagnosis present

## 2015-08-23 DIAGNOSIS — Z800 Family history of malignant neoplasm of digestive organs: Secondary | ICD-10-CM | POA: Diagnosis not present

## 2015-08-23 DIAGNOSIS — E871 Hypo-osmolality and hyponatremia: Secondary | ICD-10-CM | POA: Diagnosis not present

## 2015-08-23 DIAGNOSIS — K8020 Calculus of gallbladder without cholecystitis without obstruction: Secondary | ICD-10-CM | POA: Diagnosis present

## 2015-08-23 DIAGNOSIS — Z85048 Personal history of other malignant neoplasm of rectum, rectosigmoid junction, and anus: Secondary | ICD-10-CM

## 2015-08-23 DIAGNOSIS — I482 Chronic atrial fibrillation, unspecified: Secondary | ICD-10-CM | POA: Insufficient documentation

## 2015-08-23 DIAGNOSIS — I9589 Other hypotension: Secondary | ICD-10-CM | POA: Diagnosis present

## 2015-08-23 DIAGNOSIS — K5900 Constipation, unspecified: Secondary | ICD-10-CM | POA: Diagnosis present

## 2015-08-23 DIAGNOSIS — Z794 Long term (current) use of insulin: Secondary | ICD-10-CM

## 2015-08-23 DIAGNOSIS — Z8719 Personal history of other diseases of the digestive system: Secondary | ICD-10-CM | POA: Diagnosis not present

## 2015-08-23 DIAGNOSIS — E872 Acidosis: Secondary | ICD-10-CM | POA: Diagnosis not present

## 2015-08-23 DIAGNOSIS — I252 Old myocardial infarction: Secondary | ICD-10-CM

## 2015-08-23 DIAGNOSIS — Z9989 Dependence on other enabling machines and devices: Secondary | ICD-10-CM

## 2015-08-23 DIAGNOSIS — Z8546 Personal history of malignant neoplasm of prostate: Secondary | ICD-10-CM | POA: Diagnosis not present

## 2015-08-23 DIAGNOSIS — Z79899 Other long term (current) drug therapy: Secondary | ICD-10-CM

## 2015-08-23 DIAGNOSIS — Z6829 Body mass index (BMI) 29.0-29.9, adult: Secondary | ICD-10-CM

## 2015-08-23 DIAGNOSIS — D62 Acute posthemorrhagic anemia: Secondary | ICD-10-CM | POA: Diagnosis present

## 2015-08-23 DIAGNOSIS — Z8249 Family history of ischemic heart disease and other diseases of the circulatory system: Secondary | ICD-10-CM | POA: Diagnosis not present

## 2015-08-23 DIAGNOSIS — R740 Nonspecific elevation of levels of transaminase and lactic acid dehydrogenase [LDH]: Secondary | ICD-10-CM | POA: Diagnosis present

## 2015-08-23 DIAGNOSIS — Z955 Presence of coronary angioplasty implant and graft: Secondary | ICD-10-CM | POA: Diagnosis not present

## 2015-08-23 DIAGNOSIS — Z8679 Personal history of other diseases of the circulatory system: Secondary | ICD-10-CM

## 2015-08-23 DIAGNOSIS — D638 Anemia in other chronic diseases classified elsewhere: Secondary | ICD-10-CM | POA: Diagnosis present

## 2015-08-23 DIAGNOSIS — G4700 Insomnia, unspecified: Secondary | ICD-10-CM | POA: Diagnosis not present

## 2015-08-23 DIAGNOSIS — R7401 Elevation of levels of liver transaminase levels: Secondary | ICD-10-CM | POA: Diagnosis present

## 2015-08-23 DIAGNOSIS — K219 Gastro-esophageal reflux disease without esophagitis: Secondary | ICD-10-CM | POA: Diagnosis present

## 2015-08-23 DIAGNOSIS — E8770 Fluid overload, unspecified: Secondary | ICD-10-CM | POA: Diagnosis not present

## 2015-08-23 LAB — I-STAT CHEM 8, ED
BUN: 48 mg/dL — ABNORMAL HIGH (ref 6–20)
Calcium, Ion: 1.16 mmol/L (ref 1.13–1.30)
Chloride: 106 mmol/L (ref 101–111)
Creatinine, Ser: 4 mg/dL — ABNORMAL HIGH (ref 0.61–1.24)
Glucose, Bld: 127 mg/dL — ABNORMAL HIGH (ref 65–99)
HEMATOCRIT: 36 % — AB (ref 39.0–52.0)
HEMOGLOBIN: 12.2 g/dL — AB (ref 13.0–17.0)
POTASSIUM: 4.1 mmol/L (ref 3.5–5.1)
SODIUM: 140 mmol/L (ref 135–145)
TCO2: 21 mmol/L (ref 0–100)

## 2015-08-23 LAB — CBC WITH DIFFERENTIAL/PLATELET
BASOS ABS: 0 10*3/uL (ref 0.0–0.1)
BASOS ABS: 0 10*3/uL (ref 0.0–0.1)
BASOS PCT: 0 %
BASOS PCT: 0 %
EOS PCT: 2 %
Eosinophils Absolute: 0.2 10*3/uL (ref 0.0–0.7)
Eosinophils Absolute: 0.1 10*3/uL (ref 0.0–0.7)
Eosinophils Relative: 3 %
HCT: 33 % — ABNORMAL LOW (ref 39.0–52.0)
HEMATOCRIT: 32.5 % — AB (ref 39.0–52.0)
HEMOGLOBIN: 10.7 g/dL — AB (ref 13.0–17.0)
Hemoglobin: 10.6 g/dL — ABNORMAL LOW (ref 13.0–17.0)
LYMPHS PCT: 18 %
Lymphocytes Relative: 21 %
Lymphs Abs: 1.5 10*3/uL (ref 0.7–4.0)
Lymphs Abs: 1 10*3/uL (ref 0.7–4.0)
MCH: 32.2 pg (ref 26.0–34.0)
MCH: 31.9 pg (ref 26.0–34.0)
MCHC: 32.1 g/dL (ref 30.0–36.0)
MCHC: 32.9 g/dL (ref 30.0–36.0)
MCV: 100.3 fL — ABNORMAL HIGH (ref 78.0–100.0)
MCV: 97 fL (ref 78.0–100.0)
MONO ABS: 0.6 10*3/uL (ref 0.1–1.0)
Monocytes Absolute: 0.6 10*3/uL (ref 0.1–1.0)
Monocytes Relative: 8 %
Monocytes Relative: 10 %
NEUTROS ABS: 3.8 10*3/uL (ref 1.7–7.7)
NEUTROS PCT: 69 %
Neutro Abs: 4.9 10*3/uL (ref 1.7–7.7)
Neutrophils Relative %: 69 %
PLATELETS: 151 10*3/uL (ref 150–400)
Platelets: 126 10*3/uL — ABNORMAL LOW (ref 150–400)
RBC: 3.29 MIL/uL — ABNORMAL LOW (ref 4.22–5.81)
RBC: 3.35 MIL/uL — AB (ref 4.22–5.81)
RDW: 15.6 % — AB (ref 11.5–15.5)
RDW: 16.2 % — AB (ref 11.5–15.5)
WBC: 7.1 10*3/uL (ref 4.0–10.5)
WBC: 5.4 10*3/uL (ref 4.0–10.5)

## 2015-08-23 LAB — CBC
HEMATOCRIT: 27.9 % — AB (ref 39.0–52.0)
HEMOGLOBIN: 9 g/dL — AB (ref 13.0–17.0)
MCH: 32.1 pg (ref 26.0–34.0)
MCHC: 32.3 g/dL (ref 30.0–36.0)
MCV: 99.6 fL (ref 78.0–100.0)
Platelets: 129 10*3/uL — ABNORMAL LOW (ref 150–400)
RBC: 2.8 MIL/uL — ABNORMAL LOW (ref 4.22–5.81)
RDW: 15.4 % (ref 11.5–15.5)
WBC: 7.1 10*3/uL (ref 4.0–10.5)

## 2015-08-23 LAB — GLUCOSE, CAPILLARY
Glucose-Capillary: 94 mg/dL (ref 65–99)
Glucose-Capillary: 185 mg/dL — ABNORMAL HIGH (ref 65–99)

## 2015-08-23 LAB — URINE MICROSCOPIC-ADD ON

## 2015-08-23 LAB — URINALYSIS, ROUTINE W REFLEX MICROSCOPIC
BILIRUBIN URINE: NEGATIVE
Glucose, UA: NEGATIVE mg/dL
HGB URINE DIPSTICK: NEGATIVE
Ketones, ur: NEGATIVE mg/dL
Leukocytes, UA: NEGATIVE
Nitrite: NEGATIVE
PROTEIN: 30 mg/dL — AB
SPECIFIC GRAVITY, URINE: 1.015 (ref 1.005–1.030)
UROBILINOGEN UA: 1 mg/dL (ref 0.0–1.0)
pH: 5 (ref 5.0–8.0)

## 2015-08-23 LAB — PROTIME-INR
INR: 1.82 — ABNORMAL HIGH (ref 0.00–1.49)
PROTHROMBIN TIME: 21 s — AB (ref 11.6–15.2)

## 2015-08-23 LAB — CBG MONITORING, ED
GLUCOSE-CAPILLARY: 103 mg/dL — AB (ref 65–99)
GLUCOSE-CAPILLARY: 94 mg/dL (ref 65–99)
Glucose-Capillary: 128 mg/dL — ABNORMAL HIGH (ref 65–99)

## 2015-08-23 LAB — PREPARE RBC (CROSSMATCH)

## 2015-08-23 MED ORDER — SODIUM CHLORIDE 0.9 % IV BOLUS (SEPSIS): 250.0000 mL | Freq: Once | INTRAVENOUS | Status: DC

## 2015-08-23 MED ORDER — ONDANSETRON HCL 4 MG/2ML IJ SOLN
4.0000 mg | Freq: Once | INTRAMUSCULAR | Status: AC
  Administered 2015-08-23: 4 mg via INTRAVENOUS
  Filled 2015-08-23: qty 2

## 2015-08-23 MED ORDER — PEG-KCL-NACL-NASULF-NA ASC-C 100 G PO SOLR
1.0000 | Freq: Once | ORAL | Status: AC
  Administered 2015-08-23: 200 g via ORAL
  Filled 2015-08-23: qty 1

## 2015-08-23 MED ORDER — POLYETHYLENE GLYCOL 3350 17 G PO PACK
17.0000 g | PACK | Freq: Two times a day (BID) | ORAL | Status: DC
  Administered 2015-08-23 – 2015-08-28 (×2): 17 g via ORAL
  Filled 2015-08-23 (×8): qty 1

## 2015-08-23 MED ORDER — LORATADINE 10 MG PO TABS: 10.0000 mg | ORAL_TABLET | Freq: Every day | ORAL | Status: DC

## 2015-08-23 MED ORDER — ALBUTEROL SULFATE (2.5 MG/3ML) 0.083% IN NEBU: 2.5000 mg | INHALATION_SOLUTION | Freq: Four times a day (QID) | RESPIRATORY_TRACT | Status: DC | PRN

## 2015-08-23 MED ORDER — SODIUM CHLORIDE 0.9 % IV SOLN
INTRAVENOUS | Status: DC
  Administered 2015-08-23: 21:00:00 via INTRAVENOUS

## 2015-08-23 MED ORDER — METOPROLOL SUCCINATE ER 25 MG PO TB24
25.0000 mg | ORAL_TABLET | Freq: Two times a day (BID) | ORAL | Status: DC
  Administered 2015-08-23 – 2015-08-24 (×4): 25 mg via ORAL
  Filled 2015-08-23 (×4): qty 1

## 2015-08-23 MED ORDER — ACETAMINOPHEN 325 MG PO TABS: 650.0000 mg | ORAL_TABLET | Freq: Four times a day (QID) | ORAL | Status: DC | PRN

## 2015-08-23 MED ORDER — ACETAMINOPHEN 650 MG RE SUPP: 650.0000 mg | Freq: Four times a day (QID) | RECTAL | Status: DC | PRN

## 2015-08-23 MED ORDER — GLUCAGON HCL RDNA (DIAGNOSTIC) 1 MG IJ SOLR
1.0000 mg | Freq: Once | INTRAMUSCULAR | Status: AC
  Administered 2015-08-23: 1 mg via INTRAVENOUS
  Filled 2015-08-23: qty 1

## 2015-08-23 MED ORDER — SODIUM CHLORIDE 0.9 % IV BOLUS (SEPSIS): 500.0000 mL | Freq: Once | INTRAVENOUS | Status: DC

## 2015-08-23 MED ORDER — ALUM & MAG HYDROXIDE-SIMETH 200-200-20 MG/5ML PO SUSP: 30.0000 mL | Freq: Four times a day (QID) | ORAL | Status: DC | PRN

## 2015-08-23 MED ORDER — ALBUTEROL SULFATE 108 (90 BASE) MCG/ACT IN AEPB: 2.0000 | INHALATION_SPRAY | Freq: Four times a day (QID) | RESPIRATORY_TRACT | Status: DC | PRN

## 2015-08-23 MED ORDER — SODIUM CHLORIDE 0.9 % IJ SOLN
3.0000 mL | Freq: Two times a day (BID) | INTRAMUSCULAR | Status: DC
  Administered 2015-08-23 – 2015-08-27 (×8): 3 mL via INTRAVENOUS

## 2015-08-23 MED ORDER — ONDANSETRON HCL 4 MG PO TABS: 4.0000 mg | ORAL_TABLET | Freq: Four times a day (QID) | ORAL | Status: DC | PRN

## 2015-08-23 MED ORDER — SODIUM CHLORIDE 0.9 % IV BOLUS (SEPSIS)
1000.0000 mL | Freq: Once | INTRAVENOUS | Status: DC
Start: 2015-08-23 — End: 2015-08-23

## 2015-08-23 MED ORDER — STERILE WATER FOR INJECTION IJ SOLN
INTRAMUSCULAR | Status: AC
  Administered 2015-08-23: 10 mL
  Filled 2015-08-23: qty 10

## 2015-08-23 MED ORDER — PANTOPRAZOLE SODIUM 40 MG IV SOLR
40.0000 mg | INTRAVENOUS | Status: DC
  Administered 2015-08-23 – 2015-08-25 (×3): 40 mg via INTRAVENOUS
  Filled 2015-08-23 (×3): qty 40

## 2015-08-23 MED ORDER — INSULIN GLARGINE 300 UNIT/ML ~~LOC~~ SOPN: 20.0000 [IU] | PEN_INJECTOR | Freq: Every morning | SUBCUTANEOUS | Status: DC

## 2015-08-23 MED ORDER — SODIUM CHLORIDE 0.9 % IV SOLN
Freq: Once | INTRAVENOUS | Status: AC
  Administered 2015-08-23: 08:00:00 via INTRAVENOUS

## 2015-08-23 MED ORDER — MAGNESIUM HYDROXIDE 400 MG/5ML PO SUSP
30.0000 mL | Freq: Once | ORAL | Status: AC
  Administered 2015-08-23: 30 mL via ORAL
  Filled 2015-08-23: qty 30

## 2015-08-23 MED ORDER — FLUTICASONE PROPIONATE 50 MCG/ACT NA SUSP
1.0000 | Freq: Every day | NASAL | Status: DC | PRN
  Filled 2015-08-23: qty 16

## 2015-08-23 MED ORDER — INSULIN GLARGINE 100 UNIT/ML ~~LOC~~ SOLN
20.0000 [IU] | SUBCUTANEOUS | Status: DC
  Administered 2015-08-24 – 2015-08-29 (×4): 20 [IU] via SUBCUTANEOUS
  Filled 2015-08-23 (×8): qty 0.2

## 2015-08-23 MED ORDER — ONDANSETRON HCL 4 MG/2ML IJ SOLN
4.0000 mg | Freq: Four times a day (QID) | INTRAMUSCULAR | Status: DC | PRN
  Administered 2015-08-24: 4 mg via INTRAVENOUS
  Filled 2015-08-23: qty 2

## 2015-08-23 MED ORDER — LORATADINE 10 MG PO TABS
10.0000 mg | ORAL_TABLET | Freq: Once | ORAL | Status: AC
  Administered 2015-08-23: 10 mg via ORAL
  Filled 2015-08-23: qty 1

## 2015-08-23 MED ORDER — SODIUM CHLORIDE 0.9 % IV BOLUS (SEPSIS)
250.0000 mL | Freq: Once | INTRAVENOUS | Status: AC
  Administered 2015-08-23: 250 mL via INTRAVENOUS

## 2015-08-23 MED ORDER — INSULIN ASPART 100 UNIT/ML ~~LOC~~ SOLN
0.0000 [IU] | SUBCUTANEOUS | Status: DC
  Administered 2015-08-23 – 2015-08-24 (×2): 2 [IU] via SUBCUTANEOUS
  Administered 2015-08-24 – 2015-08-25 (×3): 1 [IU] via SUBCUTANEOUS

## 2015-08-23 NOTE — ED Notes (Signed)
Pt appears to be resting, but his feet jerk occasionally without waking him up.

## 2015-08-23 NOTE — ED Notes (Signed)
Pt states that he started feeling like "bugs [are] crawling all over [him]" after coming back from x-ray, and that he was feeling nauseous after receiving the glucagon. [place], MD notified.

## 2015-08-23 NOTE — ED Notes (Signed)
Pt arrives to ED with bleeding coming from around his stoma, states he has been passing multiple large blood clots, onset tonight around 0130. On Eliquis.

## 2015-08-23 NOTE — ED Notes (Signed)
This RN looked at the pt's stoma, and the bleeding has slowed significantly. Dressing had a small amount of blood, but mostly serosanguinous drainage. Pt's stoma is less red.

## 2015-08-23 NOTE — ED Notes (Signed)
Pt states that his blood pressure normally runs low, usually 80s/60s. Pt takes Metoprolol.

## 2015-08-23 NOTE — ED Notes (Signed)
Palumbo, MD at bedside. 

## 2015-08-23 NOTE — Progress Notes (Signed)
Patient has refused to wear CPAP tonight. He says he can't wear our CPAP. RT will continue to monitor as needed.

## 2015-08-23 NOTE — ED Notes (Signed)
Hospital Bed ordered @0520 .

## 2015-08-23 NOTE — H&P (Signed)
[facility] History and Physical                                                                                    [name], is a 59 y.o. male  MRN: [id number]   DOB - March 25, 1956  Admit Date - 08/23/2015  Outpatient Primary MD for the patient is Glo Herring., MD  Referring MD: Randal Buba / ER  Consulting M.D: [facility]  With History of -  Past Medical History  Diagnosis Date  . Essential hypertension, benign   . CAD (coronary artery disease)     a. BMS to LAD 2001 at [facility] b. PTCA/atherectomy ramus and BMS to LAD 2009  . Chronic systolic heart failure   . GERD (gastroesophageal reflux disease)   . Paroxysmal atrial fibrillation     a. on amiodarone, digoxin and Eliquis  . Adenocarcinoma of rectum     a. 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 Prostate cancer     a. s/p seed implants with chemo and radiation  . TIA (transient ischemic attack)   . OSA on CPAP   . HLD (hyperlipidemia)   . Orthostatic hypotension   . Dizziness     a. chronic. Admission for this 07/18/2014  . History of falling July 2015    due to dizziness from medications  . Ischemic cardiomyopathy     EF 18% by nuclear study 2016, multiple myocardial infarctions in past    . Chronic kidney disease, stage IV (severe) 12/11/2013  . DM type 2, uncontrolled, with renal complications 9/72/8206  . Obesity 12/12/2013  . Hematuria 03/2015  . HCAP (healthcare-associated pneumonia) 07/21/2015  . AICD (automatic cardioverter/defibrillator) present 2009    BiVentricular (upgrade)  . 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 History of blood transfusion     "I've had 2 units so far this year" (07/21/2015)  . Anemia       Past Surgical History  Procedure Laterality Date  . Internal defibrillator and pacemaker  2002  . Abdominal and perineal resection of rectum with total mesorectal excision  10/04/2007  . Colonoscopy  09/14/2011    Dr. Gala Romney: via colostomy, Single pedunculated benign  inflammatory polyp. Due for surveillance Oct 2015  . Colostomy  2008  . Colonoscopy N/A 07/02/2014    Procedure: COLONOSCOPY;  Surgeon: Daneil Dolin, MD;  Location: [name];  Service: Endoscopy;  Laterality: N/A;  7:30 / COLONOSCOPY THRU COLOSTOMY  . Esophagogastroduodenoscopy N/A 07/02/2014    Procedure: ESOPHAGOGASTRODUODENOSCOPY (EGD);  Surgeon: Daneil Dolin, MD;  Location: [name];  Service: Endoscopy;  Laterality: N/A;  7:30  . Savory dilation N/A 07/02/2014    Procedure: SAVORY DILATION;  Surgeon: Daneil Dolin, MD;  Location: [name];  Service: Endoscopy;  Laterality: N/A;  7:30  . Maloney dilation N/A 07/02/2014    Procedure: Venia Minks DILATION;  Surgeon: Daneil Dolin, MD;  Location: [name];  Service: Endoscopy;  Laterality: N/A;  7:30  . Portacath placement  06/2007    "removed ~ 1 yr later"  . Left heart catheterization with coronary angiogram N/A 07/13/2013    Procedure: LEFT HEART CATHETERIZATION  WITH CORONARY ANGIOGRAM;  Surgeon: Lorretta Harp, MD;  Location: [facility] CATH LAB;  Service: Cardiovascular;  Laterality: N/A;  . Colonoscopy N/A 12/11/2014    Dr. Gala Romney via colostomy. Normal. Repeat in 2021.   Marland Kitchen Esophagogastroduodenoscopy N/A 12/11/2014    [id number] EGD  . Cardiac defibrillator placement  2009    Upgraded to a BiV ICD  . Right heart catheterization N/A 02/24/2015    Procedure: RIGHT HEART CATH;  Surgeon: Jolaine Artist, MD;  Location: [facility] CATH LAB;  Service: Cardiovascular;  Laterality: N/A;  . Ep implantable device N/A 04/10/2015    Procedure: Ppm/Biv Ppm Generator Changeout;  Surgeon: Evans Lance, MD;  Location: [facility] INVASIVE CV LAB CUPID;  Service: Cardiovascular;  Laterality: N/A;  . Bi-ventricular implantable cardioverter defibrillator  (crt-d)  2009  . Cardiac catheterization  08/2001; 2009    ; /notes 07/10/2013  . Coronary angioplasty with stent placement  2001; ~ 2006    "1 + 1"   . Givens capsule study N/A 07/23/2015    Procedure: GIVENS  CAPSULE STUDY;  Surgeon: Wilford Corner, MD;  Location: [facility] ENDOSCOPY;  Service: Endoscopy;  Laterality: N/A;    in for   Chief Complaint  Patient presents with  . 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 Jude) and chronic hyportension with baseline readings in the 80/60 range. Patient presented to the ER due to abrupt onset of painless bleeding from his colostomy site that began in the evening hours of 9/16. Bleeding was initially brisk and associated with clots. By the time the patient arrived to the ER the bleeding had slowed down. By the time we evaluated the patient the bleeding had stopped.  Upon initial presentation to the ER patient was afebrile, blood pressure is 96/67, pulse was 114, respirations 17 with room air saturations are 100%. Blood pressure did dip as low as 75/47 that was current readings are in patient's typical range with a reading of 84/51. Patient's baseline hemoglobin is around 10.6. Initial hemoglobin upon presentation was 10.6 and follow-up readings around 6 AM this morning was 9.0. Platelets were normal at presentation at 151,000 and have decreased to 129,000. Electrolyte panel stable and at baseline for this patient although BUN slightly higher than normal at 51. Creatinine slightly higher than baseline of around 3 and currently is 4. No leukocytosis noted. Urinalysis unremarkable.  Chest x-ray without evidence of heart failure. Abdominal films did reveal significant stool burden.  Upon my questioning of the patient he confirms the above history regarding the bleeding. He denies any prodrome of nausea vomiting. No fevers or chills. No shortness of breath or chest pain.  Review of Systems   In addition to the HPI above,  No Fever-chills, myalgias or other constitutional symptoms No Headache, changes with Vision or hearing, new weakness, tingling, numbness in any extremity, No problems swallowing food or Liquids, indigestion/reflux No Chest pain, Cough or Shortness of Breath, palpitations, orthopnea or DOE No Abdominal pain, N/V No dysuria, hematuria or flank  pain No new skin rashes, lesions, masses or bruises, No new joints pains-aches No recent weight gain or loss No polyuria, polydypsia or polyphagia,  *A full 10 point Review of Systems was done, except as stated above, all other Review of Systems were negative.  Social History Social History  Substance Use Topics  . Smoking status: Never Smoker   . Smokeless tobacco: Never Used  . Alcohol Use: No     Comment: Former user 45 years ago    Resides at: Private residence  Lives with: Spouse  Ambulatory status:  Without assistive devices   Family History Family History  Problem Relation Age of Onset  . Colon cancer Mother 53  . Colon cancer Sister 68  . Coronary artery disease Father   . Colon cancer Other     2 cousins, succumbed to illness     Prior to Admission medications   Medication Sig Start Date End Date Taking? Authorizing Provider  apixaban (ELIQUIS) 5 MG TABS tablet Take 5 mg by mouth 2 (two) times daily.   Yes Historical Provider, MD  BIDIL 20-37.5 MG per tablet Take 0.5 tablets by mouth 3 (three) times daily.  07/28/15  Yes Historical Provider, MD  co-enzyme Q-10 50 MG capsule Take 50 mg by mouth every morning.    Yes Historical Provider, MD  ferrous sulfate 325 (65 FE) MG tablet Take 1  tablet (325 mg total) by mouth 2 (two) times daily with a meal. 07/24/15  Yes Shanker Kristeen Mans, MD  fluticasone (FLONASE) 50 MCG/ACT nasal spray Place 1 spray into both nostrils daily as needed for allergies.  11/21/13  Yes Historical Provider, MD  furosemide (LASIX) 40 MG tablet Take 1 tablet (40 mg total) by mouth daily. Take an additional pill with an additional potassium pill if you gain over 2 pounds in 24 hours. 08/04/15  Yes Thurnell Lose, MD  Insulin Glargine (TOUJEO SOLOSTAR [place]) Inject 30 Units into the skin every morning.   Yes Historical Provider, MD  isosorbide dinitrate (ISORDIL) 30 MG tablet Take 0.5 tablets by mouth 2 (two) times daily. 07/27/15  Yes Historical Provider, MD  metoprolol succinate (TOPROL-XL) 25 MG 24 hr tablet Take 25 mg by mouth 2 (two) times daily.  06/24/15  Yes Historical Provider, MD  Omega-3 Fatty Acids (FISH OIL) 1000 MG CAPS Take 1,000 mg by mouth 2 (two) times daily.   Yes Historical Provider, MD  pantoprazole (PROTONIX) 40 MG tablet Take 1 tablet (40 mg total) by mouth daily. 07/24/15  Yes Shanker Kristeen Mans, MD  PROAIR RESPICLICK 101 (90 BASE) MCG/ACT AEPB Inhale 2 puffs into the lungs every 6 (six) hours as needed. 08/15/15  Yes Historical Provider, MD  rosuvastatin (CRESTOR) 40 MG tablet Take 40 mg by mouth at bedtime.   Yes Historical Provider, MD  nitroGLYCERIN (NITROLINGUAL) 0.4 MG/SPRAY spray Place 1 spray under the tongue every 5 (five) minutes as needed. angina Patient not taking: Reported on 08/14/2015 07/19/14   Arnoldo Lenis, MD    No Known Allergies  Physical Exam  Vitals  Blood pressure 84/51, pulse 97, temperature 97.7 F (36.5 C), temperature source Oral, resp. rate 13, height 5\' 10"  (1.778 m), weight 207 lb (93.895 kg), SpO2 100 %.   General:  In no acute distress, appears healthy and well nourished  Psych:  Normal affect, Denies Suicidal or Homicidal ideations, Awake Alert, Oriented X 3. Speech and thought patterns are clear and  appropriate, no apparent short term memory deficits  Neuro:  No focal neurological deficits, CN II through XII intact, Strength 5/5 all 4 extremities, Sensation intact all 4 extremities.  ENT:  Ears and Eyes appear Normal, Conjunctivae clear, PER. Moist oral mucosa without erythema or exudates.  Neck:  Supple, No lymphadenopathy appreciated  Respiratory:  Symmetrical chest wall movement, Good air movement bilaterally, CTAB. Room Air  Cardiac:  RRR, No Murmurs, no LE edema noted, no JVD, No carotid bruits, peripheral pulses palpable at 2+  Abdomen:  Positive bowel sounds, Soft, Non tender, Non distended,  No masses appreciated, no obvious hepatosplenomegaly; stoma pink and unremarkable no evidence of active bleeding at time of my exam  Skin:  No Cyanosis, Normal Skin Turgor, No Skin Rash or Bruise.  Extremities: Symmetrical without obvious trauma or injury,  no effusions.  Data Review  CBC  Recent Labs Lab 08/23/15 0215 08/23/15 0230 08/23/15 0556  WBC  --  7.1 7.1  HGB 12.2* 10.6* 9.0*  HCT 36.0* 33.0* 27.9*  PLT  --  151 129*  MCV  --  100.3* 99.6  MCH  --  32.2 32.1  MCHC  --  32.1 32.3  RDW  --  15.6* 15.4  LYMPHSABS  --  1.5  --   MONOABS  --  0.6  --   EOSABS  --  0.2  --   BASOSABS  --  0.0  --     Chemistries   Recent Labs Lab 08/23/15 0215  NA 140  K 4.1  CL 106  GLUCOSE 127*  BUN 48*  CREATININE 4.00*    estimated creatinine clearance is 22.6 mL/min (by C-G formula based on Cr of 4).  No results for input(s): TSH, T4TOTAL, T3FREE, THYROIDAB in the last 72 hours.  Invalid input(s): FREET3  Coagulation profile  Recent Labs Lab 08/23/15 0230  INR 1.82*    No results for input(s): DDIMER in the last 72 hours.  Cardiac Enzymes No results for input(s): CKMB, TROPONINI, MYOGLOBIN in the last 168 hours.  Invalid input(s): CK  Invalid input(s): POCBNP  Urinalysis    Component Value Date/Time   COLORURINE YELLOW 08/23/2015 0322    APPEARANCEUR CLOUDY* 08/23/2015 0322   LABSPEC 1.015 08/23/2015 0322   PHURINE 5.0 08/23/2015 0322   GLUCOSEU NEGATIVE 08/23/2015 0322   HGBUR NEGATIVE 08/23/2015 0322   BILIRUBINUR NEGATIVE 08/23/2015 0322   KETONESUR NEGATIVE 08/23/2015 0322   PROTEINUR 30* 08/23/2015 0322   UROBILINOGEN 1.0 08/23/2015 0322   NITRITE NEGATIVE 08/23/2015 0322   LEUKOCYTESUR NEGATIVE 08/23/2015 0322    Imaging results:   Ct Abdomen Pelvis Wo Contrast  07/31/2015   CLINICAL DATA:  Followup rectal carcinoma. Previous surgery, chemotherapy, and radiation therapy. Personal history of prostate carcinoma.  EXAM: CT ABDOMEN AND PELVIS WITHOUT CONTRAST  TECHNIQUE: Multidetector CT imaging of the abdomen and pelvis was performed following the standard protocol without IV contrast.  COMPARISON:  04/05/2015  FINDINGS: Lower chest: Small bilateral pleural effusions show no significant change.  Hepatobiliary: No mass visualized on this unenhanced exam. Tiny calcified gallstone again noted, without evidence of cholecystitis or biliary ductal dilatation.  Pancreas: No mass or inflammatory process visualized on this unenhanced exam.  Spleen:  Within normal limits in size.  Adrenal Glands:  No masses identified.  Kidneys/Urinary tract: No evidence of urolithiasis or hydronephrosis.  Stomach/Bowel/Peritoneum: Left lower quadrant colostomy again demonstrated. Midline presacral soft tissue density is stable an likely due to post treatment change. There is an asymmetric ill-defined soft tissue density in the right perirectal region. This  shows mild increase in size since previous study, currently measuring 2.8 x 2.8 cm on image 79, compared to 2.3 x 2.6 cm previously. This is suspicious for recurrent carcinoma.  Vascular/Lymphatic: No pathologically enlarged lymph nodes identified. No abdominal aortic aneurysm or other significant retroperitoneal abnormality demonstrated.  Reproductive:  Brachytherapy seeds again noted in the prostate.   Other:  None.  Musculoskeletal:  No suspicious bone lesions identified.  IMPRESSION: Mild increase in size of asymmetric right perirectal soft tissue density, suspicious for recurrent carcinoma.  Stable midline presacral soft tissue density, consistent with post treatment change.  Cholelithiasis.  No radiographic evidence of cholecystitis.  Stable small bilateral pleural effusions.   Electronically Signed   By: Earle Gell M.D.   On: 07/31/2015 10:56   Dg Chest 2 View  08/02/2015   CLINICAL DATA:  Shortness of breath for 2-3 days. Intermittent. Chest pain.  EXAM: CHEST  2 VIEW  COMPARISON:  07/21/2015  FINDINGS: Multi lead left-sided pacemaker remains in place. Cardiomegaly is unchanged. Improving right upper lobe patchy opacity with minimal residual ill-defined opacity in the perihilar region. The lungs are hyperinflated. No new consolidation. No pleural effusion or pneumothorax. No acute osseous abnormalities are seen.  IMPRESSION: 1. Resolving right upper lobe opacity consistent with improving pneumonia. Minimal residual perihilar opacity persists. Recommend additional follow-up in 2-3 weeks to ensure or complete clearing. 2. Chronic cardiomegaly.  No acute process.   Electronically Signed   By: Jeb Levering M.D.   On: 08/02/2015 23:07   Dg Abd Acute W/chest  08/23/2015   CLINICAL DATA:  GI bleed.  Abdominal distension.  EXAM: DG ABDOMEN ACUTE W/ 1V CHEST  COMPARISON:  CT abdomen/ pelvis 07/31/2015 chest radiographs 08/02/2015  FINDINGS: Multi lead left-sided pacemaker in place. Cardiomegaly is unchanged. Previous right upper lobe opacity has resolved. The lungs are clear.  There is no free intra-abdominal air. No dilated bowel loops to suggest obstruction. Large volume of stool in the right colon, moderate stool throughout the remainder the colon. The left lower quadrant ostomy is not well seen. No radiopaque calculi. No acute osseous abnormalities are seen. No radiopaque calculi. There are vascular  calcifications. No acute osseous abnormalities are seen.  IMPRESSION: 1. Large volume of colonic stool. No bowel dilatation to suggest obstruction. 2. Resolved right upper lobe opacity.  No acute pulmonary process.   Electronically Signed   By: Jeb Levering M.D.   On: 08/23/2015 03:12      Assessment & Plan  Principal Problem:   Bleeding from colostomy stoma/obstipation -Admit to stepdown [facility] gastroenterology consulted -Anticoagulation with eliquis on hold until further recommendations from GI -NPO -Suspect may have endothelial trauma from chronic constipation therefore we'll begin Miralax BID and monitor for results but suspect due to degree of stool burden seen on x-ray will likely need aggressive laxative therapy -Low rate IV fluid while NPO  Active Problems:   Chronic hypotension -Baseline blood pressure around 80/60 -Current blood pressure stable    Anemia of chronic disease/acute blood loss anemia -Hemoglobin has drifted down to 9.0 -Even underlying known ischemic cardiomyopathy and low ejection fraction with associated hypotension we'll go ahead and transfuse 1 unit packed red blood cells -Repeat CBC after transfusion and again in a.m.    Ischemic cardiomyopathy-EF 35% 3/87/56/ Chronic systolic heart failure -Appears compensated -Because of underlying chronic hypotension in setting of active/recent GI bleeding will minimize any medications that can influence blood pressure therefore have placed BiDil on hold but will continue metoprolol -Hold diuretics  for now -Daily weights and strict I/O      Paroxysmal atrial fibrillation/Long term current use of anticoagulant therapy -On hold secondary to active bleeding    CAD with LCX disease and stents to LAD -Currently asymptomatic and no evidence of chest pain or dyspnea on exertion -Need to clarify if on BiDil 20-30 7.5  noe half tablet in addition to Isordil 15 mg twice a day    Chronic kidney disease, stage IV  (severe) -BUN slightly elevated and likely related to current bleeding -Creatinine stable and near baseline -Follow labs    DM type 2, uncontrolled, with renal complications -Check CBGs while NPO provide SSI -Need to clarify Toujeo dosage but for now have ordered 20 units daily    Chronically elevated transaminase level -Check LFTs in a.m.    GERD  -Continue PPI    OSA on CPAP -Provide nocturnal CPAP    Hyperlipidemia -Crestor on hold    H/O ventricular fibrillation-March 2016/BiV ICD (St Jude) gen change 04/10/15 -No longer appears to be on amiodarone based on medication reconciliation sheet    DVT Prophylaxis: SCD  Family Communication:   No family at bedside  Code Status:  Full code  Condition:  Stable  Discharge disposition: Anticipate discharge back home environment in the next 24-48 hours pending resolution of GI bleeding symptoms; resumption of anticoagulation at discretion of gastroenterology  Time spent in minutes : 60      ELLIS,ALLISON L. ANP on 08/23/2015 at 7:47 AM  Between 7am to 7pm - Pager - [phone]  After 7pm go to [email or web address] - password [email or web address]  And look for the night coverage person covering me after hours  [facility]

## 2015-08-23 NOTE — ED Notes (Signed)
Contacted pharmacy regarding delay in Miralax delivery

## 2015-08-23 NOTE — ED Notes (Signed)
Paged [place] Nurse for assistance

## 2015-08-23 NOTE — ED Notes (Signed)
Requested Stoma/Ostomy supplies from [facility] for patient.  Area is currently secured by Non-adhesive dressing per GI MD

## 2015-08-23 NOTE — ED Notes (Signed)
Stoma site still actively oozing blood, dressing applied.

## 2015-08-23 NOTE — Care Management Note (Signed)
Case Management Note  Patient Details  Name: JERARD BAYS MRN: [id number] Date of Birth: [birth year]  Subjective/Objective: 59 y.o M admitted to SDU with c/o active bleeding from Colostomy Stoma. Has been on Eliquis as anticoagulant for hx Paroxysmal Atrial fibrillation. Extremely Low BP. From Private residence with spouse and children.                 Action/Plan:CM will continue to follow for disposition and any discharge needs.    Expected Discharge Date:                  Expected Discharge Plan:     In-House Referral:     Discharge planning Services     Post Acute Care Choice:    Choice offered to:     DME Arranged:    DME Agency:     HH Arranged:    [place] Agency:     Status of Service:     Medicare Important Message Given:    Date Medicare IM Given:    Medicare IM give by:    Date Additional Medicare IM Given:    Additional Medicare Important Message give by:     If discussed at [place] of Stay Meetings, dates discussed:    Additional Comments:  Delrae Sawyers, RN 08/23/2015, 9:04 AM

## 2015-08-23 NOTE — ED Notes (Signed)
Pt states that he had to pull over while driving home tonight to burp his ostomy bag, but when he took off the bag, all that came out was blood. When this RN examined pt, 3 4x4 guaze pads were saturated. This RN placed more gauze and an ABD pad over the pt's stoma. Pt denies pain, but reports SOB x 2 weeks.

## 2015-08-23 NOTE — Consult Note (Signed)
Consultation  Referring Provider:   Triad [name]   Primary Care Physician:  [name]., MD Primary Gastroenterologist:   Robert Cornea, MD      Reason for Consultation: GI bleed             HPI:   [name] is a 59 y.o. male with multiple significant medical problems as listed in [name]. She also has  including history of stage III rectal cancer, s/p APR / chemotherapy. Patient followed by Dr. Benay [name].   Patient was hospitalized with heart failure in August. During that time hgb found in 7 range on Eliquis. Patient was transfused. EGD and colonoscopy had been done in January so neither repeated that admission.  Inpatient Capsule Endo revealed a small AVM in small bowel, mild gastritis, a benign appearing small bowel nodule and questionable colon polyp.   Patient in ED now with blood in stool. Patient began having painless bleeding yesterday. He apparently passed some large clots. Last Eliquis dose yesterday. No nausea or vomiting. Patient denies constipation, ostomy output "same as always" but KUB shows a large stool burden.   Past Medical History  Diagnosis Date  . Essential hypertension, benign   . CAD (coronary artery disease)     a. BMS to LAD 2001 at [facility] b. PTCA/atherectomy ramus and BMS to LAD 2009  . Chronic systolic heart failure   . GERD (gastroesophageal reflux disease)   . Paroxysmal atrial fibrillation     a. on amiodarone, digoxin and Eliquis  . Adenocarcinoma of rectum     a. 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 Prostate cancer     a. s/p seed implants with chemo and radiation  . TIA (transient ischemic attack)   . OSA on CPAP   . HLD (hyperlipidemia)   . Orthostatic hypotension   . Dizziness     a. chronic. Admission for this 07/18/2014  . History of falling July 2015    due to dizziness from medications  . Ischemic cardiomyopathy     EF 18% by nuclear study 2016, multiple myocardial infarctions in past    . Chronic kidney disease, stage IV (severe) 12/11/2013    . DM type 2, uncontrolled, with renal complications 03/19/2439  . Obesity 12/12/2013  . HCAP (healthcare-associated pneumonia) 07/21/2015  . AICD (automatic cardioverter/defibrillator) present 2009    BiVentricular (upgrade)  . 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 History of blood transfusion     "I've had 2 units so far this year" (07/21/2015)  . Anemia     Past Surgical History  Procedure Laterality Date  . Internal defibrillator and pacemaker  2002  . Abdominal and perineal resection of rectum with total mesorectal excision  10/04/2007  . Colonoscopy  09/14/2011    Dr. Gala Romney: via colostomy, Single pedunculated benign inflammatory polyp. Due for surveillance Oct 2015  . Colostomy  2008  . Colonoscopy N/A 07/02/2014    Procedure: COLONOSCOPY;  Surgeon: Daneil Dolin, MD;  [facility];  Service: Endoscopy;  Laterality: N/A;  7:30 / COLONOSCOPY THRU COLOSTOMY  . Esophagogastroduodenoscopy N/A 07/02/2014    Procedure: ESOPHAGOGASTRODUODENOSCOPY (EGD);  Surgeon: Daneil Dolin, MD;  Location: [facility];  Service: Endoscopy;  Laterality: N/A;  7:30  . Savory dilation N/A 07/02/2014    Procedure: SAVORY DILATION;  Surgeon: Daneil Dolin, MD;  Location: [facility];  Service: Endoscopy;  Laterality: N/A;  7:30  . Maloney dilation N/A 07/02/2014  Procedure: MALONEY DILATION;  Surgeon: Daneil Dolin, MD;  Location: [facility];  Service: Endoscopy;  Laterality: N/A;  7:30  . Portacath placement  06/2007    "removed ~ 1 yr later"  . Left heart catheterization with coronary angiogram N/A 07/13/2013    Procedure: LEFT HEART CATHETERIZATION WITH CORONARY ANGIOGRAM;  Surgeon: Lorretta Harp, MD;  Location: [facility] CATH LAB;  Service: Cardiovascular;  Laterality: N/A;  . Colonoscopy N/A 12/11/2014    Dr. Gala Romney via colostomy. Normal. Repeat in 2021.   Marland Kitchen Esophagogastroduodenoscopy N/A 12/11/2014    [id number] EGD  . Cardiac defibrillator placement  2009     Upgraded to a BiV ICD  . Right heart catheterization N/A 02/24/2015    Procedure: RIGHT HEART CATH;  Surgeon: Jolaine Artist, MD;  Location: [facility] CATH LAB;  Service: Cardiovascular;  Laterality: N/A;  . Ep implantable device N/A 04/10/2015    Procedure: Ppm/Biv Ppm Generator Changeout;  Surgeon: Evans Lance, MD;  Location: [place] INVASIVE CV LAB CUPID;  Service: Cardiovascular;  Laterality: N/A;  . Bi-ventricular implantable cardioverter defibrillator  (crt-d)  2009  . Cardiac catheterization  08/2001; 2009    ; /notes 07/10/2013  . Coronary angioplasty with stent placement  2001; ~ 2006    "1 + 1"   . Givens capsule study N/A 07/23/2015    Procedure: GIVENS CAPSULE STUDY;  Surgeon: Wilford Corner, MD;  Location: [facility] ENDOSCOPY;  Service: Endoscopy;  Laterality: N/A;    Family History  Problem Relation Age of Onset  . Colon cancer Mother 60  . Colon cancer Sister 19  . Coronary artery disease Father   . Colon cancer Other     2 cousins, succumbed to illness     Social History  Substance Use Topics  . Smoking status: Never Smoker   . Smokeless tobacco: Never Used  . Alcohol Use: No     Comment: Former user 45 years ago    Prior to Admission medications   Medication Sig Start Date End Date Taking? Authorizing Provider  apixaban (ELIQUIS) 5 MG TABS tablet Take 5 mg by mouth 2 (two) times daily.   Yes Historical Provider, MD  BIDIL 20-37.5 MG per tablet Take 0.5 tablets by mouth 3 (three) times daily.  07/28/15  Yes Historical Provider, MD  co-enzyme Q-10 50 MG capsule Take 50 mg by mouth every morning.    Yes Historical Provider, MD  ferrous sulfate 325 (65 FE) MG tablet Take 1 tablet (325 mg total) by mouth 2 (two) times daily with a meal. 07/24/15  Yes Shanker Kristeen Mans, MD  fluticasone (FLONASE) 50 MCG/ACT nasal spray Place 1 spray into both nostrils daily as needed for allergies.  11/21/13  Yes Historical Provider, MD  furosemide (LASIX) 40 MG tablet Take 1 tablet (40 mg total) by  mouth daily. Take an additional pill with an additional potassium pill if you gain over 2 pounds in 24 hours. 08/04/15  Yes Thurnell Lose, MD  Insulin Glargine (TOUJEO SOLOSTAR [place]) Inject 30 Units into the skin every morning.   Yes Historical Provider, MD  isosorbide dinitrate (ISORDIL) 30 MG tablet Take 0.5 tablets by mouth 2 (two) times daily. 07/27/15  Yes Historical Provider, MD  metoprolol succinate (TOPROL-XL) 25 MG 24 hr tablet Take 25 mg by mouth 2 (two) times daily.  06/24/15  Yes Historical Provider, MD  Omega-3 Fatty Acids (FISH OIL) 1000 MG CAPS Take 1,000 mg by mouth 2 (two) times daily.   Yes Historical  Provider, MD  pantoprazole (PROTONIX) 40 MG tablet Take 1 tablet (40 mg total) by mouth daily. 07/24/15  Yes Shanker Kristeen Mans, MD  PROAIR RESPICLICK 376 (90 BASE) MCG/ACT AEPB Inhale 2 puffs into the lungs every 6 (six) hours as needed. 08/15/15  Yes Historical Provider, MD  rosuvastatin (CRESTOR) 40 MG tablet Take 40 mg by mouth at bedtime.   Yes Historical Provider, MD  nitroGLYCERIN (NITROLINGUAL) 0.4 MG/SPRAY spray Place 1 spray under the tongue every 5 (five) minutes as needed. angina Patient not taking: Reported on 08/14/2015 07/19/14   Arnoldo Lenis, MD    Current Facility-Administered Medications  Medication Dose Route Frequency Provider Last Rate Last Dose  . 0.9 %  sodium chloride infusion   Intravenous Continuous Samella Parr, NP   Stopped at 08/23/15 1034  . acetaminophen (TYLENOL) tablet 650 mg  650 mg Oral Q6H PRN Samella Parr, NP       Or  . acetaminophen (TYLENOL) suppository 650 mg  650 mg Rectal Q6H PRN Samella Parr, NP      . albuterol (PROVENTIL) (2.5 MG/3ML) 0.083% nebulizer solution 2.5 mg  2.5 mg Nebulization Q6H PRN Hosie Poisson, MD      . fluticasone (FLONASE) 50 MCG/ACT nasal spray 1 spray  1 spray Each Nare Daily PRN Samella Parr, NP      . insulin aspart (novoLOG) injection 0-9 Units  0-9 Units Subcutaneous 6 times per day Samella Parr, NP    0 Units at 08/23/15 0843  . insulin glargine (LANTUS) injection 20 Units  20 Units Subcutaneous Q24H Hosie Poisson, MD   20 Units at 08/23/15 1141  . metoprolol succinate (TOPROL-XL) 24 hr tablet 25 mg  25 mg Oral BID Samella Parr, NP   25 mg at 08/23/15 1033  . ondansetron (ZOFRAN) tablet 4 mg  4 mg Oral Q6H PRN Samella Parr, NP       Or  . ondansetron [facility]) injection 4 mg  4 mg Intravenous Q6H PRN Samella Parr, NP      . pantoprazole (PROTONIX) injection 40 mg  40 mg Intravenous Q24H Ivor Costa, MD   40 mg at 08/23/15 [id number]  . polyethylene glycol (MIRALAX / GLYCOLAX) packet 17 g  17 g Oral BID Samella Parr, NP   17 g at 08/23/15 1144  . sodium chloride 0.9 % injection 3 mL  3 mL Intravenous Q12H Samella Parr, NP   3 mL at 08/23/15 0944   Current Outpatient Prescriptions  Medication Sig Dispense Refill  . apixaban (ELIQUIS) 5 MG TABS tablet Take 5 mg by mouth 2 (two) times daily.    Marland Kitchen BIDIL 20-37.5 MG per tablet Take 0.5 tablets by mouth 3 (three) times daily.     Marland Kitchen co-enzyme Q-10 50 MG capsule Take 50 mg by mouth every morning.     . ferrous sulfate 325 (65 FE) MG tablet Take 1 tablet (325 mg total) by mouth 2 (two) times daily with a meal. 60 tablet 0  . fluticasone (FLONASE) 50 MCG/ACT nasal spray Place 1 spray into both nostrils daily as needed for allergies.     . furosemide (LASIX) 40 MG tablet Take 1 tablet (40 mg total) by mouth daily. Take an additional pill with an additional potassium pill if you gain over 2 pounds in 24 hours. 30 tablet 1  . Insulin Glargine (TOUJEO SOLOSTAR [place]) Inject 30 Units into the skin every morning.    . isosorbide dinitrate (  ISORDIL) 30 MG tablet Take 0.5 tablets by mouth 2 (two) times daily.  0  . metoprolol succinate (TOPROL-XL) 25 MG 24 hr tablet Take 25 mg by mouth 2 (two) times daily.   0  . Omega-3 Fatty Acids (FISH OIL) 1000 MG CAPS Take 1,000 mg by mouth 2 (two) times daily.    . pantoprazole (PROTONIX) 40 MG tablet Take 1 tablet (40  mg total) by mouth daily. 30 tablet 0  . PROAIR RESPICLICK 161 (90 BASE) MCG/ACT AEPB Inhale 2 puffs into the lungs every 6 (six) hours as needed.  0  . rosuvastatin (CRESTOR) 40 MG tablet Take 40 mg by mouth at bedtime.    . nitroGLYCERIN (NITROLINGUAL) 0.4 MG/SPRAY spray Place 1 spray under the tongue every 5 (five) minutes as needed. angina (Patient not taking: Reported on 08/14/2015) 12 g 3    Allergies as of 08/23/2015  . (No Known Allergies)    Review of Systems:    As per HPI, otherwise negative    Physical Exam:  Vital signs in last 24 hours: Temp:  [97.7 F (36.5 C)-98 F (36.7 C)] 97.9 F (36.6 C) (09/17 1115) Pulse Rate:  [92-121] 99 (09/17 1200) Resp:  [0-25] 18 (09/17 1200) BP: (75-101)/(42-70) 82/68 mmHg (09/17 1200) SpO2:  [95 %-100 %] 100 % (09/17 1200) Weight:  [207 lb (93.895 kg)] 207 lb (93.895 kg) (09/17 0145)   General:   Pleasant white in NAD Head:  Normocephalic and atraumatic. Eyes:   No icterus.   Conjunctiva pink. Ears:  Normal auditory acuity. Neck:  Supple Lungs:  Respirations even and unlabored. Lungs clear to auscultation bilaterally.   No wheezes, crackles, or rhonchi.  Heart:  Regular rate and rhythm Abdomen:  Soft, nondistended, nontender. Normal bowel sounds. Colostomy site appears health. Soft, brown stool mixed with blood on digital exam  Rectal:  Not performed.  Msk:  Symmetrical without gross deformities.  Extremities:  Without edema. Neurologic:  Alert and  oriented x4;  grossly normal neurologically. Skin:  Intact without significant lesions or rashes. Psych:  Alert and cooperative. Normal affect.  LAB RESULTS:  Recent Labs  08/23/15 0215 08/23/15 0230 08/23/15 0556  WBC  --  7.1 7.1  HGB 12.2* 10.6* 9.0*  HCT 36.0* 33.0* 27.9*  PLT  --  151 129*   BMET  Recent Labs  08/23/15 0215  NA 140  K 4.1  CL 106  GLUCOSE 127*  BUN 48*  CREATININE 4.00*   PT/INR  Recent Labs  08/23/15 0230  LABPROT 21.0*  INR 1.82*     STUDIES: Dg Abd Acute W/chest  08/23/2015   CLINICAL DATA:  GI bleed.  Abdominal distension.  EXAM: DG ABDOMEN ACUTE W/ 1V CHEST  COMPARISON:  CT abdomen/ pelvis 07/31/2015 chest radiographs 08/02/2015  FINDINGS: Multi lead left-sided pacemaker in place. Cardiomegaly is unchanged. Previous right upper lobe opacity has resolved. The lungs are clear.  There is no free intra-abdominal air. No dilated bowel loops to suggest obstruction. Large volume of stool in the right colon, moderate stool throughout the remainder the colon. The left lower quadrant ostomy is not well seen. No radiopaque calculi. No acute osseous abnormalities are seen. No radiopaque calculi. There are vascular calcifications. No acute osseous abnormalities are seen.  IMPRESSION: 1. Large volume of colonic stool. No bowel dilatation to suggest obstruction. 2. Resolved right upper lobe opacity.  No acute pulmonary process.   Electronically Signed   By: Jeb Levering M.D.   On: 08/23/2015 03:12  PREVIOUS ENDOSCOPIES:             Last surveillance colonoscopy January 2016 by Dr. Gala Romney revealed normal colon from colostomy to cecum.   Inpatient capsule endoscopy Aug 2016 revealed small nonbleeding SB AVM, mild gastritis, a benign-appearing small bowel nodule and questionable colon polyp.    Impression / Plan:   10. 59 year old male with history of stage III rectal cancer diagnosed 2008, s/p chemoradiation and APR. CTscan in late April 2016 revealed a perirectal nodule suspicious for recurrent cancer. Followup CTscan in August revealed slight enlargement of that nodule. Dr. Benay [name] is following closely outpatient.   2.  GI bleed on Eliquis. Painless bleeding into colostomy since yesterday. Blood mixed with stool in digital exam of ostomy. Patient has chronic anemia. Baseline hgb 9-10 range. Hgb 10.6 at 2:30am today, 9.0 at 6am.  .For futher evaluation patient will be scheduled for colonoscopy. Will scheduled tentatively for tomorrow  with MAC. Bowel prep this afternoon.   Monitor H&H, transfuse if needed.   3. Multiple significant medical problems not limited to CAD / CHF / atrial fibrillation / AICD placement /chronic anemia / CKD  4. Cholelithiasis, asymptomatic at present   5. Hx of prostate cancer, s/p radiation  Thanks   LOS: 0 days   Tye Savoy  08/23/2015, 12:40 PM   Attending Addendum: I have taken an interval history, reviewed the chart, and examined the patient. I agree with the Advanced Practitioner's note and impression. Significant cardiac history on Eliquis, with anemia s/p negative EGD and colonoscopy in January. Capsule showed small bowel AVM of unclear significance. He has had overt hematochezia today. On exam in the ER he has completely brown stool coming out of the ostomy however when his ostomy was examined and manipulated red blood was expressed. Unclear if this is luminal GI bleeding or perhaps an issue with the ostomy? I would still bowel prep him and see contents and prepare for colonoscopy tomorrow if possible, however may be good to have general surgery see him and evaluate the ostomy. Cardiology should also be aware of him given his significant comorbidities and that eliquis is being held. Please resuscitate and transfuse as needed. Thanks. Please call with any changes in his status in the interim.   Wheaton Cellar, MD [facility] Gastroenterology Pager [phone]

## 2015-08-23 NOTE — Consult Note (Signed)
Admit date: 08/23/2015 Referring Physician  Dr. Havery Moros Primary Physician Glo Herring., MD Primary Cardiologist  Dr. Hilbert Odor Reason for Consultation  Pre op risk for colonoscopy  HPI: 59 year old male with chronic systolic heart failure, ischemic cardiomyopathy with ejection fraction of 35% from echo in May 2016 with biventricular ICD followed by Dr. Lovena Le with recent generator change on 04/10/15, history of ventricular fibrillatory arrest 3/16 terminated by ICD shock placed on amiodarone that was discontinued secondary to transaminitis with catheterization 07/2013 showing no culprit vessels managed medically, atrial fibrillation/flutter compliant with Eliquis here with need for colonoscopy.  He presented in the emergency room with blood in his stool, painless bleeding, large clots were passed. Has an ostomy. He thinks that part of the bleeding was perhaps mechanical from heavy coughing that he has been experiencing and perhaps the bleeding was more distal around the stoma site. He did however have clots passed as described above. This may represent internal bleeding.  Has a history of stage III rectal cancer post chemotherapy. Back in August was immediate with heart failure and hemoglobin was in the 7 range on Eliquis. EGD and colonoscopy were done previously in January 2016, no repeat on that admission. Small AVM was noted by capsule endoscopy in the small bowel. Questionable colonic polyp.  Denies any significant orthopnea, PND. No change in mild shortness of breath. No chest pain. Discussed with he and his wife.  Following with nephrology now; Dr. Hinda Lenis. At home BP in 90s. They understand that soon he may need hemodialysis.   PMH:   Past Medical History  Diagnosis Date  . Essential hypertension, benign   . CAD (coronary artery disease)     a. BMS to LAD 2001 at [facility] b. PTCA/atherectomy ramus and BMS to LAD 2009  . Chronic systolic heart failure   . GERD  (gastroesophageal reflux disease)   . Paroxysmal atrial fibrillation     a. on amiodarone, digoxin and Eliquis  . Adenocarcinoma of rectum     a. 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 Prostate cancer     a. s/p seed implants with chemo and radiation  . TIA (transient ischemic attack)   . OSA on CPAP   . HLD (hyperlipidemia)   . Orthostatic hypotension   . Dizziness     a. chronic. Admission for this 07/18/2014  . History of falling July 2015    due to dizziness from medications  . Ischemic cardiomyopathy     EF 18% by nuclear study 2016, multiple myocardial infarctions in past    . Chronic kidney disease, stage IV (severe) 12/11/2013  . DM type 2, uncontrolled, with renal complications 02/26/4009  . Obesity 12/12/2013  . Hematuria 03/2015  . HCAP (healthcare-associated pneumonia) 07/21/2015  . AICD (automatic cardioverter/defibrillator) present 2009    BiVentricular (upgrade)  . 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 History of blood transfusion     "I've had 2 units so far this year" (07/21/2015)  . Anemia     PSH:   Past Surgical History  Procedure Laterality Date  . Internal defibrillator and pacemaker  2002  . Abdominal and perineal resection of rectum with total mesorectal excision  10/04/2007  . Colonoscopy  09/14/2011    Dr. Gala Romney: via colostomy, Single pedunculated benign inflammatory polyp. Due for surveillance Oct 2015  . Colostomy  2008  . Colonoscopy N/A 07/02/2014    Procedure: COLONOSCOPY;  Surgeon: Daneil Dolin, MD;  Location: [facility];  Service:  Endoscopy;  Laterality: N/A;  7:30 / COLONOSCOPY THRU COLOSTOMY  . Esophagogastroduodenoscopy N/A 07/02/2014    Procedure: ESOPHAGOGASTRODUODENOSCOPY (EGD);  Surgeon: Daneil Dolin, MD;  Location: [facility];  Service: Endoscopy;  Laterality: N/A;  7:30  . Savory dilation N/A 07/02/2014    Procedure: SAVORY DILATION;  Surgeon: Daneil Dolin, MD;  Location: [facility];  Service: Endoscopy;  Laterality: N/A;   7:30  . Maloney dilation N/A 07/02/2014    Procedure: Venia Minks DILATION;  Surgeon: Daneil Dolin, MD;  Location: [facility];  Service: Endoscopy;  Laterality: N/A;  7:30  . Portacath placement  06/2007    "removed ~ 1 yr later"  . Left heart catheterization with coronary angiogram N/A 07/13/2013    Procedure: LEFT HEART CATHETERIZATION WITH CORONARY ANGIOGRAM;  Surgeon: Lorretta Harp, MD;  Location: [facility] CATH LAB;  Service: Cardiovascular;  Laterality: N/A;  . Colonoscopy N/A 12/11/2014    Dr. Gala Romney via colostomy. Normal. Repeat in 2021.   Marland Kitchen Esophagogastroduodenoscopy N/A 12/11/2014    EQA:STMHDQ EGD  . Cardiac defibrillator placement  2009    Upgraded to a BiV ICD  . Right heart catheterization N/A 02/24/2015    Procedure: RIGHT HEART CATH;  Surgeon: Jolaine Artist, MD;  Location: [facility] CATH LAB;  Service: Cardiovascular;  Laterality: N/A;  . Ep implantable device N/A 04/10/2015    Procedure: Ppm/Biv Ppm Generator Changeout;  Surgeon: Evans Lance, MD;  Location: [place] INVASIVE CV LAB CUPID;  Service: Cardiovascular;  Laterality: N/A;  . Bi-ventricular implantable cardioverter defibrillator  (crt-d)  2009  . Cardiac catheterization  08/2001; 2009    ; /notes 07/10/2013  . Coronary angioplasty with stent placement  2001; ~ 2006    "1 + 1"   . Givens capsule study N/A 07/23/2015    Procedure: GIVENS CAPSULE STUDY;  Surgeon: Wilford Corner, MD;  Location: [facility] ENDOSCOPY;  Service: Endoscopy;  Laterality: N/A;   Allergies:  Review of patient's allergies indicates no known allergies. Prior to Admit Meds:   Prior to Admission medications   Medication Sig Start Date End Date Taking? Authorizing Provider  apixaban (ELIQUIS) 5 MG TABS tablet Take 5 mg by mouth 2 (two) times daily.   Yes Historical Provider, MD  BIDIL 20-37.5 MG per tablet Take 0.5 tablets by mouth 3 (three) times daily.  07/28/15  Yes Historical Provider, MD  co-enzyme Q-10 50 MG capsule Take 50 mg by mouth every morning.    Yes  Historical Provider, MD  ferrous sulfate 325 (65 FE) MG tablet Take 1 tablet (325 mg total) by mouth 2 (two) times daily with a meal. 07/24/15  Yes Shanker Kristeen Mans, MD  fluticasone (FLONASE) 50 MCG/ACT nasal spray Place 1 spray into both nostrils daily as needed for allergies.  11/21/13  Yes Historical Provider, MD  furosemide (LASIX) 40 MG tablet Take 1 tablet (40 mg total) by mouth daily. Take an additional pill with an additional potassium pill if you gain over 2 pounds in 24 hours. 08/04/15  Yes Thurnell Lose, MD  Insulin Glargine (TOUJEO SOLOSTAR [place]) Inject 30 Units into the skin every morning.   Yes Historical Provider, MD  isosorbide dinitrate (ISORDIL) 30 MG tablet Take 0.5 tablets by mouth 2 (two) times daily. 07/27/15  Yes Historical Provider, MD  metoprolol succinate (TOPROL-XL) 25 MG 24 hr tablet Take 25 mg by mouth 2 (two) times daily.  06/24/15  Yes Historical Provider, MD  Omega-3 Fatty Acids (FISH OIL) 1000 MG CAPS  Take 1,000 mg by mouth 2 (two) times daily.   Yes Historical Provider, MD  pantoprazole (PROTONIX) 40 MG tablet Take 1 tablet (40 mg total) by mouth daily. 07/24/15  Yes Shanker Kristeen Mans, MD  PROAIR RESPICLICK 570 (90 BASE) MCG/ACT AEPB Inhale 2 puffs into the lungs every 6 (six) hours as needed. 08/15/15  Yes Historical Provider, MD  rosuvastatin (CRESTOR) 40 MG tablet Take 40 mg by mouth at bedtime.   Yes Historical Provider, MD  nitroGLYCERIN (NITROLINGUAL) 0.4 MG/SPRAY spray Place 1 spray under the tongue every 5 (five) minutes as needed. angina Patient not taking: Reported on 08/14/2015 07/19/14   Arnoldo Lenis, MD   Fam HX:    Family History  Problem Relation Age of Onset  . Colon cancer Mother 40  . Colon cancer Sister 65  . Coronary artery disease Father   . Colon cancer Other     2 cousins, succumbed to illness   Social HX:    Social History   Social History  . Marital Status: Married    Spouse Name: N/A  . Number of Children: 1  . Years of Education:  N/A   Occupational History  .     Social History Main Topics  . Smoking status: Never Smoker   . Smokeless tobacco: Never Used  . Alcohol Use: No     Comment: Former user 45 years ago  . Drug Use: No  . Sexual Activity: No   Other Topics Concern  . Not on file   Social History Narrative   Married 32 years   1 son, 2 grandkids    Denies alcohol, drugs, tobacco      ROS:  As described above, positive GI bleed All 11 ROS were addressed and are negative except what is stated in the HPI   Physical Exam: Blood pressure 86/60, pulse 104, temperature 97.7 F (36.5 C), temperature source Oral, resp. rate 17, height _0  (1.778 m), weight 207 lb (93.895 kg), SpO2 99 %.   General: Well developed, well nourished, in no acute distress Head: Eyes PERRLA, No xanthomas.   Normal cephalic and atramatic  Lungs:   Clear bilaterally to auscultation and percussion. Normal respiratory effort. No wheezes, no rales. Heart:   Tachycardic regular, displaced PMI, Pulses are 2+ & equal. No murmur, rubs, gallops.  No carotid bruit. No JVD.  No abdominal bruits.  Abdomen: Stoma site noted. Positive bowel sounds Msk:  Back normal. Normal strength and tone for age. Extremities:  No clubbing, cyanosis or edema.  DP +1 Neuro: Alert and oriented X 3, non-focal, MAE x 4 GU: Deferred Rectal: Deferred Psych:  Good affect, responds appropriately      Labs: Lab Results  Component Value Date   WBC 5.4 08/23/2015   HGB 10.7* 08/23/2015   HCT 32.5* 08/23/2015   MCV 97.0 08/23/2015   PLT 126* 08/23/2015     Recent Labs Lab 08/23/15 0215  NA 140  K 4.1  CL 106  BUN 48*  CREATININE 4.00*  GLUCOSE 127*   No results for input(s): CKTOTAL, CKMB, TROPONINI in the last 72 hours. Lab Results  Component Value Date   CHOL 186 11/20/2014   HDL 27* 11/20/2014   LDLCALC 123* 11/20/2014   TRIG 180* 11/20/2014   Lab Results  Component Value Date   DDIMER  02/06/2009    <0.22        AT THE INHOUSE  ESTABLISHED CUTOFF VALUE OF 0.48 ug/mL FEU, THIS ASSAY HAS  BEEN DOCUMENTED IN THE LITERATURE TO HAVE A SENSITIVITY AND NEGATIVE PREDICTIVE VALUE OF AT LEAST 98 TO 99%.  THE TEST RESULT SHOULD BE CORRELATED WITH AN ASSESSMENT OF THE CLINICAL PROBABILITY OF DVT / VTE.     Radiology:  Dg Abd Acute W/chest  08/23/2015   CLINICAL DATA:  GI bleed.  Abdominal distension.  EXAM: DG ABDOMEN ACUTE W/ 1V CHEST  COMPARISON:  CT abdomen/ pelvis 07/31/2015 chest radiographs 08/02/2015  FINDINGS: Multi lead left-sided pacemaker in place. Cardiomegaly is unchanged. Previous right upper lobe opacity has resolved. The lungs are clear.  There is no free intra-abdominal air. No dilated bowel loops to suggest obstruction. Large volume of stool in the right colon, moderate stool throughout the remainder the colon. The left lower quadrant ostomy is not well seen. No radiopaque calculi. No acute osseous abnormalities are seen. No radiopaque calculi. There are vascular calcifications. No acute osseous abnormalities are seen.  IMPRESSION: 1. Large volume of colonic stool. No bowel dilatation to suggest obstruction. 2. Resolved right upper lobe opacity.  No acute pulmonary process.   Electronically Signed   By: Jeb Levering M.D.   On: 08/23/2015 03:12   Personally viewed.  EKG:  Prior EKG detected possible atrial tachycardia/atrial fibrillation heart rate 110 bpm with possible right ventricular hypertrophy, tall R-wave in V1 and V2. No significant change from prior. Personally viewed.   ECHO: 04/24/15  - Left ventricle: Poor acoustic windows limit study even with Definity use . LVEF is approximately is apprxoiamtely 35% with hypokinesis/akinesis of distal 1/3 of LV. The cavity size was severely dilated. Systolic function was normal. Wall motion was normal; there were no regional wall motion abnormalities. - Aortic valve: There was trivial regurgitation. - Mitral valve: There was mild regurgitation. - Left  atrium: The atrium was severely dilated. - Right ventricle: Systolic function was mildly reduced. - Pulmonary arteries: PA peak pressure: 55 mm Hg (S).  NUC stress 04/26/15:  - Large left ventricle, old infarct pattern. Reduced EF less than 30%. No significant ischemia identified. No further heart catheterization pursued. This was personally viewed by Dr.Bensimhon. I also looked at images. Agree. Large scar.  Scheduled Meds: . insulin aspart  0-9 Units Subcutaneous 6 times per day  . insulin glargine  20 Units Subcutaneous Q24H  . metoprolol succinate  25 mg Oral BID  . pantoprazole (PROTONIX) IV  40 mg Intravenous Q24H  . peg 3350 powder  1 kit Oral Once  . polyethylene glycol  17 g Oral BID  . sodium chloride  3 mL Intravenous Q12H   Continuous Infusions: . sodium chloride Stopped (08/23/15 1034)   PRN Meds:.acetaminophen **OR** acetaminophen, albuterol, fluticasone, ondansetron **OR** ondansetron (ZOFRAN) IV   ASSESSMENT/PLAN:    59 year old male with ischemic cardiomyopathy ejection fraction approximately 35% with biventricular ICD, paroxysmal atrial fibrillation here with recurrent GI bleed in need of colonoscopy.  1. Preoperative risk stratification prior to colonoscopy-overall, he does not demonstrate any signs of overt heart failure. No orthopnea, no PND. Currently seems euvolemic. Ejection fraction is reduced at 35%. He does have a large infarct pattern in his anterior apical and inferior apical region. Based upon these findings, he is of moderate risk for colonoscopy. He may proceed. Gentle fluid administration throughout procedure. His blood pressures usually are running in the 80s to 90s at home. He and his wife understand that even with gentle sedation, worsening hypotension could occur and potential cardiovascular complications may occur as well. I do not see any clear contraindications to  proceeding with colonoscopy at this time.  2. Chronic systolic heart failure   -Ischemic cardiomyopathy. Previously followed by Carolynn Serve, M.D. at [facility]. Not transplant or VAD candidate.  - Slightly increased weight currently 207. Would like his weight at home less than 198 pounds. Continue with Lasix. Does not seem to have any significant signs of overt failure.  3. Coronary artery disease  - Prior angina controlled with Isordil  4. Atrial fibrillation  - Chronic no longer on amiodarone.  - Eliquis has been held secondary to recent bleeding episode.  - Also had traumatic subarachnoid hemorrhage in January 2016 but stable and was placed back on Eliquis.  - Continue metoprolol.  5. Rectal cancer 2008-diverting colostomy.  6. Chronic kidney disease stage IV  - Dr Lowanda Foster in [place]  7. Prior ventricular fibrillation  - Off of amiodarone.  Candee Furbish, MD  08/23/2015  4:49 PM

## 2015-08-23 NOTE — ED Provider Notes (Signed)
CSN: [id number]     Arrival date & time 08/23/15  0139 History  This chart was scribed for Teren Zurcher, MD by Evelene Croon, ED Scribe. This patient was seen in room B17C/B17C and the patient's care was started 1:53 AM.   Chief Complaint  Patient presents with  . Bleeding from stoma     Patient is a 59 y.o. male presenting with hematochezia. The history is provided by the patient. No language interpreter was used.  Rectal Bleeding Quality: red blood and clots per stoma. Amount:  Moderate Timing:  Constant Progression:  Improving Chronicity:  New Context: not constipation   Relieved by:  Nothing Worsened by:  Nothing tried Ineffective treatments:  None tried Associated symptoms: no fever and no vomiting   Risk factors: anticoagulant use    HPI Comments:  [name] is a 59 y.o. male with a history of a colostomy, who presents to the Emergency Department complaining of bleeding from 59 year old stoma site since ~66 yesterday; states he has noticed clotting at the site. He denies trauma/injury to the site. He also denies nausea,  vomiting and fever. Pt is currently on eliquis which he has been taking for over 1 year. No alleviating factors noted.  Past Medical History  Diagnosis Date  . Essential hypertension, benign   . CAD (coronary artery disease)     a. BMS to LAD 2001 at [facility] b. PTCA/atherectomy ramus and BMS to LAD 2009  . Chronic systolic heart failure   . GERD (gastroesophageal reflux disease)   . Paroxysmal atrial fibrillation     a. on amiodarone, digoxin and Eliquis  . Adenocarcinoma of rectum     a. 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 Prostate cancer     a. s/p seed implants with chemo and radiation  . TIA (transient ischemic attack)   . OSA on CPAP   . HLD (hyperlipidemia)   . Orthostatic hypotension   . Dizziness     a. chronic. Admission for this 07/18/2014  . History of falling July 2015    due to dizziness from medications  . Ischemic cardiomyopathy     EF 18% by  nuclear study 2016, multiple myocardial infarctions in past    . Chronic kidney disease, stage IV (severe) 12/11/2013  . DM type 2, uncontrolled, with renal complications 2/97/9892  . Obesity 12/12/2013  . Hematuria 03/2015  . HCAP (healthcare-associated pneumonia) 07/21/2015  . AICD (automatic cardioverter/defibrillator) present 2009    BiVentricular (upgrade)  . 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 History of blood transfusion     "I've had 2 units so far this year" (07/21/2015)  . Anemia    Past Surgical History  Procedure Laterality Date  . Internal defibrillator and pacemaker  2002  . Abdominal and perineal resection of rectum with total mesorectal excision  10/04/2007  . Colonoscopy  09/14/2011    Dr. Gala Romney: via colostomy, Single pedunculated benign inflammatory polyp. Due for surveillance Oct 2015  . Colostomy  2008  . Colonoscopy N/A 07/02/2014    Procedure: COLONOSCOPY;  Surgeon: Daneil Dolin, MD;  Location: [facility];  Service: Endoscopy;  Laterality: N/A;  7:30 / COLONOSCOPY THRU COLOSTOMY  . Esophagogastroduodenoscopy N/A 07/02/2014    Procedure: ESOPHAGOGASTRODUODENOSCOPY (EGD);  Surgeon: Daneil Dolin, MD;  Location: [facility];  Service: Endoscopy;  Laterality: N/A;  7:30  . Savory dilation N/A 07/02/2014    Procedure: SAVORY DILATION;  Surgeon: Daneil Dolin, MD;  Location: [facility];  Service: Endoscopy;  Laterality: N/A;  7:30  . Maloney dilation N/A 07/02/2014    Procedure: Venia Minks DILATION;  Surgeon: Daneil Dolin, MD;  Location: [facility];  Service: Endoscopy;  Laterality: N/A;  7:30  . Portacath placement  06/2007    "removed ~ 1 yr later"  . Left heart catheterization with coronary angiogram N/A 07/13/2013    Procedure: LEFT HEART CATHETERIZATION WITH CORONARY ANGIOGRAM;  Surgeon: Lorretta Harp, MD;  Location[facility] CATH LAB;  Service: Cardiovascular;  Laterality: N/A;  . Colonoscopy N/A 12/11/2014    Dr. Gala Romney via colostomy.  Normal. Repeat in 2021.   Marland Kitchen Esophagogastroduodenoscopy N/A 12/11/2014    NOB:SJGGEZ EGD  . Cardiac defibrillator placement  2009    Upgraded to a BiV ICD  . Right heart catheterization N/A 02/24/2015    Procedure: RIGHT HEART CATH;  Surgeon: Jolaine Artist, MD;  Location: [facility] CATH LAB;  Service: Cardiovascular;  Laterality: N/A;  . Ep implantable device N/A 04/10/2015    Procedure: Ppm/Biv Ppm Generator Changeout;  Surgeon: Evans Lance, MD;  Location: [facility] INVASIVE CV LAB CUPID;  Service: Cardiovascular;  Laterality: N/A;  . Bi-ventricular implantable cardioverter defibrillator  (crt-d)  2009  . Cardiac catheterization  08/2001; 2009    ; /notes 07/10/2013  . Coronary angioplasty with stent placement  2001; ~ 2006    "1 + 1"   . Givens capsule study N/A 07/23/2015    Procedure: GIVENS CAPSULE STUDY;  Surgeon: Wilford Corner, MD;  Location: [facility] ENDOSCOPY;  Service: Endoscopy;  Laterality: N/A;   Family History  Problem Relation Age of Onset  . Colon cancer Mother 23  . Colon cancer Sister 41  . Coronary artery disease Father   . Colon cancer Other     2 cousins, succumbed to illness   Social History  Substance Use Topics  . Smoking status: Never Smoker   . Smokeless tobacco: Never Used  . Alcohol Use: No     Comment: Former user 45 years ago    Review of Systems  Constitutional: Negative for fever.  Gastrointestinal: Positive for hematochezia. Negative for nausea and vomiting.  All other systems reviewed and are negative.  Allergies  Review of patient's allergies indicates no known allergies.  Home Medications   Prior to Admission medications   Medication Sig Start Date End Date Taking? Authorizing Provider  apixaban (ELIQUIS) 5 MG TABS tablet Take 5 mg by mouth 2 (two) times daily.    Historical Provider, MD  BIDIL 20-37.5 MG per tablet Take 0.5 tablets by mouth 3 (three) times daily.  07/28/15   Historical Provider, MD  co-enzyme Q-10 50 MG capsule Take 50 mg by mouth every  morning.     Historical Provider, MD  ferrous sulfate 325 (65 FE) MG tablet Take 1 tablet (325 mg total) by mouth 2 (two) times daily with a meal. 07/24/15   Shanker Kristeen Mans, MD  fluticasone (FLONASE) 50 MCG/ACT nasal spray Place 1 spray into both nostrils daily as needed for allergies.  11/21/13   Historical Provider, MD  furosemide (LASIX) 40 MG tablet Take 1 tablet (40 mg total) by mouth daily. Take an additional pill with an additional potassium pill if you gain over 2 pounds in 24 hours. 08/04/15   Thurnell Lose, MD  glimepiride (AMARYL) 1 MG tablet Take 1 mg by mouth daily with breakfast.    Historical Provider, MD  Insulin Glargine (TOUJEO SOLOSTAR [place]) Inject 30 Units  into the skin every morning.    Historical Provider, MD  isosorbide dinitrate (ISORDIL) 30 MG tablet Take 0.5 tablets by mouth 2 (two) times daily. 07/27/15   Historical Provider, MD  metoprolol succinate (TOPROL-XL) 25 MG 24 hr tablet Take 25 mg by mouth 2 (two) times daily.  06/24/15   Historical Provider, MD  nitroGLYCERIN (NITROLINGUAL) 0.4 MG/SPRAY spray Place 1 spray under the tongue every 5 (five) minutes as needed. angina Patient not taking: Reported on 08/14/2015 07/19/14   Arnoldo Lenis, MD  Omega-3 Fatty Acids (FISH OIL) 1000 MG CAPS Take 1,000 mg by mouth 2 (two) times daily.    Historical Provider, MD  pantoprazole (PROTONIX) 40 MG tablet Take 1 tablet (40 mg total) by mouth daily. 07/24/15   Shanker Kristeen Mans, MD  rosuvastatin (CRESTOR) 40 MG tablet Take 40 mg by mouth at bedtime.    Historical Provider, MD   BP 95/69 mmHg  Pulse 117  Temp(Src) 97.7 F (36.5 C) (Oral)  Resp 16  Ht 5\' 10"  (1.778 m)  Wt 207 lb (93.895 kg)  BMI 29.70 kg/m2  SpO2 100% Physical Exam  Constitutional: He is oriented to person, place, and time. He appears well-developed and well-nourished. No distress.  HENT:  Head: Normocephalic and atraumatic.  Mouth/Throat: Oropharynx is clear and moist. No oropharyngeal exudate.  Moist  mucous membranes   Eyes: Conjunctivae are normal. Pupils are equal, round, and reactive to light.  Neck: Normal range of motion. Neck supple.  Trachea midline  Cardiovascular: Regular rhythm, normal heart sounds and intact distal pulses.  Tachycardia present.   Pulmonary/Chest: Effort normal and breath sounds normal. No respiratory distress. He has no wheezes. He has no rales.  Abdominal: Soft. He exhibits no distension and no mass. There is no tenderness. There is no rebound and no guarding.    Hyperactive bowel sounds  Scant ooze noted to stoma site; tissue pink but friable; no ecchymosis  Neurological: He is alert and oriented to person, place, and time. He has normal reflexes.  Skin: Skin is warm and dry.  Psychiatric: He has a normal mood and affect. His behavior is normal.  Nursing note and vitals reviewed.   ED Course  Procedures   DIAGNOSTIC STUDIES:  Oxygen Saturation is 100% on RA, normal by my interpretation.    COORDINATION OF CARE:  1:57 AM Discussed treatment plan with pt at bedside and pt agreed to plan.  Labs Review Labs Reviewed - No data to display  Imaging Review No results found. I have personally reviewed and evaluated these images and lab results as part of my medical decision-making.   EKG Interpretation None      MDM   Final diagnoses:  None    Results for orders placed or performed during the hospital encounter of 08/23/15  CBC with Differential/Platelet  Result Value Ref Range   WBC 7.1 4.0 - 10.5 K/uL   RBC 3.29 (L) 4.22 - 5.81 MIL/uL   Hemoglobin 10.6 (L) 13.0 - 17.0 g/dL   HCT 33.0 (L) 39.0 - 52.0 %   MCV 100.3 (H) 78.0 - 100.0 fL   MCH 32.2 26.0 - 34.0 pg   MCHC 32.1 30.0 - 36.0 g/dL   RDW 15.6 (H) 11.5 - 15.5 %   Platelets 151 150 - 400 K/uL   Neutrophils Relative % 69 %   Neutro Abs 4.9 1.7 - 7.7 K/uL   Lymphocytes Relative 21 %   Lymphs Abs 1.5 0.7 - 4.0 K/uL  Monocytes Relative 8 %   Monocytes Absolute 0.6 0.1 - 1.0  K/uL   Eosinophils Relative 2 %   Eosinophils Absolute 0.2 0.0 - 0.7 K/uL   Basophils Relative 0 %   Basophils Absolute 0.0 0.0 - 0.1 K/uL  Protime-INR  Result Value Ref Range   Prothrombin Time 21.0 (H) 11.6 - 15.2 seconds   INR 1.82 (H) 0.00 - 1.49  Urinalysis, Routine w reflex microscopic (not at [facility])  Result Value Ref Range   Color, Urine YELLOW YELLOW   APPearance CLOUDY (A) CLEAR   Specific Gravity, Urine 1.015 1.005 - 1.030   pH 5.0 5.0 - 8.0   Glucose, UA NEGATIVE NEGATIVE mg/dL   Hgb urine dipstick NEGATIVE NEGATIVE   Bilirubin Urine NEGATIVE NEGATIVE   Ketones, ur NEGATIVE NEGATIVE mg/dL   Protein, ur 30 (A) NEGATIVE mg/dL   Urobilinogen, UA 1.0 0.0 - 1.0 mg/dL   Nitrite NEGATIVE NEGATIVE   Leukocytes, UA NEGATIVE NEGATIVE  Urine microscopic-add on  Result Value Ref Range   Squamous Epithelial / LPF RARE RARE   WBC, UA 0-2 <3 WBC/hpf   Urine-Other MUCOUS PRESENT   I-Stat Chem 8, ED  Result Value Ref Range   Sodium 140 135 - 145 mmol/L   Potassium 4.1 3.5 - 5.1 mmol/L   Chloride 106 101 - 111 mmol/L   BUN 48 (H) 6 - 20 mg/dL   Creatinine, Ser 4.00 (H) 0.61 - 1.24 mg/dL   Glucose, Bld 127 (H) 65 - 99 mg/dL   Calcium, Ion 1.16 1.13 - 1.30 mmol/L   TCO2 21 0 - 100 mmol/L   Hemoglobin 12.2 (L) 13.0 - 17.0 g/dL   HCT 36.0 (L) 39.0 - 52.0 %  CBG monitoring, ED  Result Value Ref Range   Glucose-Capillary 128 (H) 65 - 99 mg/dL  Type and screen for Red Blood Exchange  Result Value Ref Range   ABO/RH(D) O POS    Antibody Screen NEG    Sample Expiration 08/26/2015    Ct Abdomen Pelvis Wo Contrast  07/31/2015   CLINICAL DATA:  Followup rectal carcinoma. Previous surgery, chemotherapy, and radiation therapy. Personal history of prostate carcinoma.  EXAM: CT ABDOMEN AND PELVIS WITHOUT CONTRAST  TECHNIQUE: Multidetector CT imaging of the abdomen and pelvis was performed following the standard protocol without IV contrast.  COMPARISON:  04/05/2015  FINDINGS: Lower chest:  Small bilateral pleural effusions show no significant change.  Hepatobiliary: No mass visualized on this unenhanced exam. Tiny calcified gallstone again noted, without evidence of cholecystitis or biliary ductal dilatation.  Pancreas: No mass or inflammatory process visualized on this unenhanced exam.  Spleen:  Within normal limits in size.  Adrenal Glands:  No masses identified.  Kidneys/Urinary tract: No evidence of urolithiasis or hydronephrosis.  Stomach/Bowel/Peritoneum: Left lower quadrant colostomy again demonstrated. Midline presacral soft tissue density is stable an likely due to post treatment change. There is an asymmetric ill-defined soft tissue density in the right perirectal region. This shows mild increase in size since previous study, currently measuring 2.8 x 2.8 cm on image 79, compared to 2.3 x 2.6 cm previously. This is suspicious for recurrent carcinoma.  Vascular/Lymphatic: No pathologically enlarged lymph nodes identified. No abdominal aortic aneurysm or other significant retroperitoneal abnormality demonstrated.  Reproductive:  Brachytherapy seeds again noted in the prostate.  Other:  None.  Musculoskeletal:  No suspicious bone lesions identified.  IMPRESSION: Mild increase in size of asymmetric right perirectal soft tissue density, suspicious for recurrent carcinoma.  Stable midline presacral  soft tissue density, consistent with post treatment change.  Cholelithiasis.  No radiographic evidence of cholecystitis.  Stable small bilateral pleural effusions.   Electronically Signed   By: Earle Gell M.D.   On: 07/31/2015 10:56   Dg Chest 2 View  08/02/2015   CLINICAL DATA:  Shortness of breath for 2-3 days. Intermittent. Chest pain.  EXAM: CHEST  2 VIEW  COMPARISON:  07/21/2015  FINDINGS: Multi lead left-sided pacemaker remains in place. Cardiomegaly is unchanged. Improving right upper lobe patchy opacity with minimal residual ill-defined opacity in the perihilar region. The lungs are  hyperinflated. No new consolidation. No pleural effusion or pneumothorax. No acute osseous abnormalities are seen.  IMPRESSION: 1. Resolving right upper lobe opacity consistent with improving pneumonia. Minimal residual perihilar opacity persists. Recommend additional follow-up in 2-3 weeks to ensure or complete clearing. 2. Chronic cardiomegaly.  No acute process.   Electronically Signed   By: Jeb Levering M.D.   On: 08/02/2015 23:07   Dg Abd Acute W/chest  08/23/2015   CLINICAL DATA:  GI bleed.  Abdominal distension.  EXAM: DG ABDOMEN ACUTE W/ 1V CHEST  COMPARISON:  CT abdomen/ pelvis 07/31/2015 chest radiographs 08/02/2015  FINDINGS: Multi lead left-sided pacemaker in place. Cardiomegaly is unchanged. Previous right upper lobe opacity has resolved. The lungs are clear.  There is no free intra-abdominal air. No dilated bowel loops to suggest obstruction. Large volume of stool in the right colon, moderate stool throughout the remainder the colon. The left lower quadrant ostomy is not well seen. No radiopaque calculi. No acute osseous abnormalities are seen. No radiopaque calculi. There are vascular calcifications. No acute osseous abnormalities are seen.  IMPRESSION: 1. Large volume of colonic stool. No bowel dilatation to suggest obstruction. 2. Resolved right upper lobe opacity.  No acute pulmonary process.   Electronically Signed   By: Jeb Levering M.D.   On: 08/23/2015 03:12    Medications  glucagon (human recombinant) (GLUCAGEN) injection 1 mg (not administered)  sodium chloride 0.9 % bolus 250 mL (not administered)  pantoprazole (PROTONIX) injection 40 mg (not administered)  glucagon (human recombinant) (GLUCAGEN) injection 1 mg (1 mg Intravenous Given 08/23/15 0311)  ondansetron (ZOFRAN) injection 4 mg (4 mg Intravenous Given 08/23/15 0334)   Case d/w Dr. Blaine Hamper admit to step down   I personally performed the services described in this documentation, which was scribed in my presence. The  recorded information has been reviewed and is accurate.     Veatrice Kells, MD 08/23/15 [phone]

## 2015-08-23 NOTE — Consult Note (Addendum)
Reason for Consult:GI bleed Referring Physician: Dr Judie Petit is an 59 y.o. male.  HPI: 59 yo obese with multiple medical problems (ISCM, chronic heart failure, CAD, CKD, PAF, OSA, AICD) with h/o stage 3 rectal cancer in 2008 treated with chemo/xrt/APR who comes in with bleeding from colostomy that started in the middle of the night. Was hospitalized in Aug for anemia with hgb 7. Found to have small SB AVM, mild gastritis, benign appearing SB nodule, and question of colonic polyp. Had normal colonoscopy in Jan 2016. On chronic anticoagulation.   Pt thinks he may have been bleeding from around colostomy. He has been HD stable since presentation. His baseline BP is in the 90s. hgb 9 this am. Recd 1u prbc  Reports he has been coughing some over past couple of weeks Reports remote prior episode where had bleeding from edge of colostomy that reqd a stitch  Past Medical History  Diagnosis Date  . Essential hypertension, benign   . CAD (coronary artery disease)     a. BMS to LAD 2001 at [facility] b. PTCA/atherectomy ramus and BMS to LAD 2009  . Chronic systolic heart failure   . GERD (gastroesophageal reflux disease)   . Paroxysmal atrial fibrillation     a. on amiodarone, digoxin and Eliquis  . Adenocarcinoma of rectum     a. 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 Prostate cancer     a. s/p seed implants with chemo and radiation  . TIA (transient ischemic attack)   . OSA on CPAP   . HLD (hyperlipidemia)   . Orthostatic hypotension   . Dizziness     a. chronic. Admission for this 07/18/2014  . History of falling July 2015    due to dizziness from medications  . Ischemic cardiomyopathy     EF 18% by nuclear study 2016, multiple myocardial infarctions in past    . Chronic kidney disease, stage IV (severe) 12/11/2013  . DM type 2, uncontrolled, with renal complications 0/24/0973  . Obesity 12/12/2013  . Hematuria 03/2015  . HCAP (healthcare-associated pneumonia) 07/21/2015  . AICD (automatic  cardioverter/defibrillator) present 2009    BiVentricular (upgrade)  . 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 History of blood transfusion     "I've had 2 units so far this year" (07/21/2015)  . Anemia     Past Surgical History  Procedure Laterality Date  . Internal defibrillator and pacemaker  2002  . Abdominal and perineal resection of rectum with total mesorectal excision  10/04/2007  . Colonoscopy  09/14/2011    Dr. Gala Romney: via colostomy, Single pedunculated benign inflammatory polyp. Due for surveillance Oct 2015  . Colostomy  2008  . Colonoscopy N/A 07/02/2014    Procedure: COLONOSCOPY;  Surgeon: Daneil Dolin, MD;  [facility];  Service: Endoscopy;  Laterality: N/A;  7:30 / COLONOSCOPY THRU COLOSTOMY  . Esophagogastroduodenoscopy N/A 07/02/2014    Procedure: ESOPHAGOGASTRODUODENOSCOPY (EGD);  Surgeon: Daneil Dolin, MD;  Location: [facility] ENDO SUITE;  Service: Endoscopy;  Laterality: N/A;  7:30  . Savory dilation N/A 07/02/2014    Procedure: SAVORY DILATION;  Surgeon: Daneil Dolin, MD;  Location: [facility] ENDO SUITE;  Service: Endoscopy;  Laterality: N/A;  7:30  . Maloney dilation N/A 07/02/2014    Procedure: Venia Minks DILATION;  Surgeon: Daneil Dolin, MD;  Location: [facility] ENDO SU[facility];  Service: Endoscopy;  Laterality: N/A;  7:30  . Portacath placement  06/2007    "removed ~ 1 yr  later"  . Left heart catheterization with coronary angiogram N/A 07/13/2013    Procedure: LEFT HEART CATHETERIZATION WITH CORONARY ANGIOGRAM;  Surgeon: Lorretta Harp, MD;  Location: [facility] CATH LAB;  Service: Cardiovascular;  Laterality: N/A;  . Colonoscopy N/A 12/11/2014    Dr. Gala Romney via colostomy. Normal. Repeat in 2021.   Marland Kitchen Esophagogastroduodenoscopy N/A 12/11/2014    BDZ:HGDJME EGD  . Cardiac defibrillator placement  2009    Upgraded to a BiV ICD  . Right heart catheterization N/A 02/24/2015    Procedure: RIGHT HEART CATH;  Surgeon: Jolaine Artist, MD;  Location: [facility] CATH LAB;   Service: Cardiovascular;  Laterality: N/A;  . Ep implantable device N/A 04/10/2015    Procedure: Ppm/Biv Ppm Generator Changeout;  Surgeon: Evans Lance, MD;  Location: [place] INVASIVE CV LAB CUPID;  Service: Cardiovascular;  Laterality: N/A;  . Bi-ventricular implantable cardioverter defibrillator  (crt-d)  2009  . Cardiac catheterization  08/2001; 2009    ; /notes 07/10/2013  . Coronary angioplasty with stent placement  2001; ~ 2006    "1 + 1"   . Givens capsule study N/A 07/23/2015    Procedure: GIVENS CAPSULE STUDY;  Surgeon: Wilford Corner, MD;  Location: [facility] ENDOSCOPY;  Service: Endoscopy;  Laterality: N/A;    Family History  Problem Relation Age of Onset  . Colon cancer Mother 83  . Colon cancer Sister 5  . Coronary artery disease Father   . Colon cancer Other     2 cousins, succumbed to illness    Social History:  reports that he has never smoked. He has never used smokeless tobacco. He reports that he does not drink alcohol or use illicit drugs.  Allergies: No Known Allergies  Medications: I have reviewed the patient's current medications.  Results for orders placed or performed during the hospital encounter of 08/23/15 (from the past 48 hour(s))  I-Stat Chem 8, ED     Status: Abnormal   Collection Time: 08/23/15  2:15 AM  Result Value Ref Range   Sodium 140 135 - 145 mmol/L   Potassium 4.1 3.5 - 5.1 mmol/L   Chloride 106 101 - 111 mmol/L   BUN 48 (H) 6 - 20 mg/dL   Creatinine, Ser 4.00 (H) 0.61 - 1.24 mg/dL   Glucose, Bld 127 (H) 65 - 99 mg/dL   Calcium, Ion 1.16 1.13 - 1.30 mmol/L   TCO2 21 0 - 100 mmol/L   Hemoglobin 12.2 (L) 13.0 - 17.0 g/dL   HCT 36.0 (L) 39.0 - 52.0 %  Type and screen for Red Blood Exchange     Status: None (Preliminary result)   Collection Time: 08/23/15  2:15 AM  Result Value Ref Range   ABO/RH(D) O POS    Antibody Screen NEG    Sample Expiration 08/26/2015    Unit Number [id number]    Blood Component Type RED CELLS,LR    Unit division  00    Status of Unit ISSUED    Transfusion Status OK TO TRANSFUSE    Crossmatch Result Compatible   CBC with Differential/Platelet     Status: Abnormal   Collection Time: 08/23/15  2:30 AM  Result Value Ref Range   WBC 7.1 4.0 - 10.5 K/uL   RBC 3.29 (L) 4.22 - 5.81 MIL/uL   Hemoglobin 10.6 (L) 13.0 - 17.0 g/dL   HCT 33.0 (L) 39.0 - 52.0 %   MCV 100.3 (H) 78.0 - 100.0 fL   MCH 32.2 26.0 - 34.0 pg  MCHC 32.1 30.0 - 36.0 g/dL   RDW 15.6 (H) 11.5 - 15.5 %   Platelets 151 150 - 400 K/uL   Neutrophils Relative % 69 %   Neutro Abs 4.9 1.7 - 7.7 K/uL   Lymphocytes Relative 21 %   Lymphs Abs 1.5 0.7 - 4.0 K/uL   Monocytes Relative 8 %   Monocytes Absolute 0.6 0.1 - 1.0 K/uL   Eosinophils Relative 2 %   Eosinophils Absolute 0.2 0.0 - 0.7 K/uL   Basophils Relative 0 %   Basophils Absolute 0.0 0.0 - 0.1 K/uL  Protime-INR     Status: Abnormal   Collection Time: 08/23/15  2:30 AM  Result Value Ref Range   Prothrombin Time 21.0 (H) 11.6 - 15.2 seconds   INR 1.82 (H) 0.00 - 1.49  CBG monitoring, ED     Status: Abnormal   Collection Time: 08/23/15  3:19 AM  Result Value Ref Range   Glucose-Capillary 128 (H) 65 - 99 mg/dL  Urinalysis, Routine w reflex microscopic (not at [facility])     Status: Abnormal   Collection Time: 08/23/15  3:22 AM  Result Value Ref Range   Color, Urine YELLOW YELLOW   APPearance CLOUDY (A) CLEAR   Specific Gravity, Urine 1.015 1.005 - 1.030   pH 5.0 5.0 - 8.0   Glucose, UA NEGATIVE NEGATIVE mg/dL   Hgb urine dipstick NEGATIVE NEGATIVE   Bilirubin Urine NEGATIVE NEGATIVE   Ketones, ur NEGATIVE NEGATIVE mg/dL   Protein, ur 30 (A) NEGATIVE mg/dL   Urobilinogen, UA 1.0 0.0 - 1.0 mg/dL   Nitrite NEGATIVE NEGATIVE   Leukocytes, UA NEGATIVE NEGATIVE  Urine microscopic-add on     Status: None   Collection Time: 08/23/15  3:22 AM  Result Value Ref Range   Squamous Epithelial / LPF RARE RARE   WBC, UA 0-2 <3 WBC/hpf   Urine-Other MUCOUS PRESENT   CBC     Status:  Abnormal   Collection Time: 08/23/15  5:56 AM  Result Value Ref Range   WBC 7.1 4.0 - 10.5 K/uL   RBC 2.80 (L) 4.22 - 5.81 MIL/uL   Hemoglobin 9.0 (L) 13.0 - 17.0 g/dL   HCT 27.9 (L) 39.0 - 52.0 %   MCV 99.6 78.0 - 100.0 fL   MCH 32.1 26.0 - 34.0 pg   MCHC 32.3 30.0 - 36.0 g/dL   RDW 15.4 11.5 - 15.5 %   Platelets 129 (L) 150 - 400 K/uL  Prepare RBC     Status: None   Collection Time: 08/23/15  7:30 AM  Result Value Ref Range   Order Confirmation ORDER PROCESSED BY BLOOD BANK   CBG monitoring, ED     Status: Abnormal   Collection Time: 08/23/15  8:41 AM  Result Value Ref Range   Glucose-Capillary 103 (H) 65 - 99 mg/dL  CBG monitoring, ED     Status: None   Collection Time: 08/23/15 11:33 AM  Result Value Ref Range   Glucose-Capillary 94 65 - 99 mg/dL  CBC with Differential     Status: Abnormal   Collection Time: 08/23/15  1:56 PM  Result Value Ref Range   WBC 5.4 4.0 - 10.5 K/uL   RBC 3.35 (L) 4.22 - 5.81 MIL/uL   Hemoglobin 10.7 (L) 13.0 - 17.0 g/dL   HCT 32.5 (L) 39.0 - 52.0 %   MCV 97.0 78.0 - 100.0 fL   MCH 31.9 26.0 - 34.0 pg   MCHC 32.9 30.0 - 36.0 g/dL  RDW 16.2 (H) 11.5 - 15.5 %   Platelets 126 (L) 150 - 400 K/uL   Neutrophils Relative % 69 %   Neutro Abs 3.8 1.7 - 7.7 K/uL   Lymphocytes Relative 18 %   Lymphs Abs 1.0 0.7 - 4.0 K/uL   Monocytes Relative 10 %   Monocytes Absolute 0.6 0.1 - 1.0 K/uL   Eosinophils Relative 3 %   Eosinophils Absolute 0.1 0.0 - 0.7 K/uL   Basophils Relative 0 %   Basophils Absolute 0.0 0.0 - 0.1 K/uL  Glucose, capillary     Status: None   Collection Time: 08/23/15  4:10 PM  Result Value Ref Range   Glucose-Capillary 94 65 - 99 mg/dL   Comment 1 Notify RN    Comment 2 Document in Chart     Dg Abd Acute W/chest  08/23/2015   CLINICAL DATA:  GI bleed.  Abdominal distension.  EXAM: DG ABDOMEN ACUTE W/ 1V CHEST  COMPARISON:  CT abdomen/ pelvis 07/31/2015 chest radiographs 08/02/2015  FINDINGS: Multi lead left-sided pacemaker in  place. Cardiomegaly is unchanged. Previous right upper lobe opacity has resolved. The lungs are clear.  There is no free intra-abdominal air. No dilated bowel loops to suggest obstruction. Large volume of stool in the right colon, moderate stool throughout the remainder the colon. The left lower quadrant ostomy is not well seen. No radiopaque calculi. No acute osseous abnormalities are seen. No radiopaque calculi. There are vascular calcifications. No acute osseous abnormalities are seen.  IMPRESSION: 1. Large volume of colonic stool. No bowel dilatation to suggest obstruction. 2. Resolved right upper lobe opacity.  No acute pulmonary process.   Electronically Signed   By: Jeb Levering M.D.   On: 08/23/2015 03:12    Review of Systems  Constitutional: Negative for weight loss.  HENT: Negative for nosebleeds.   Eyes: Negative for blurred vision.  Respiratory: Positive for shortness of breath (chronic).   Cardiovascular: Negative for chest pain, palpitations, orthopnea and PND.       Denies DOE  Gastrointestinal: Negative for nausea, vomiting, abdominal pain, diarrhea and constipation.  Genitourinary: Negative for dysuria and hematuria.  Musculoskeletal: Negative.   Skin: Negative for itching and rash.  Neurological: Negative for dizziness, focal weakness, seizures, loss of consciousness and headaches.       Denies TIAs, amaurosis fugax  Endo/Heme/Allergies: Does not bruise/bleed easily.  Psychiatric/Behavioral: The patient is not nervous/anxious.    Blood pressure 92/70, pulse 104, temperature 97.7 F (36.5 C), temperature source Oral, resp. rate 25, height 5\' 10"  (1.778 m), weight 93.895 kg (207 lb), SpO2 99 %. Physical Exam  Vitals reviewed. Constitutional: He is oriented to person, place, and time. He appears well-developed and well-nourished. No distress.  Obese, nontoxic, resting comfortably  HENT:  Head: Normocephalic and atraumatic.  Right Ear: External ear normal.  Left Ear:  External ear normal.  Eyes: Conjunctivae are normal. No scleral icterus.  Neck: Normal range of motion. Neck supple. No tracheal deviation present. No thyromegaly present.  Cardiovascular: Normal rate and normal heart sounds.   Respiratory: Effort normal and breath sounds normal. No stridor. No respiratory distress. He has no wheezes.  GI: Soft. He exhibits no distension. There is no tenderness. There is no rebound and no guarding.    Old midline incision; colostomy in LLQ. Top of bag removed. Thick stool on top of ostomy, no blood or melena in bag. Stool gently wiped off of ostomy. Wafer left on skin. No lacerations/tears noticed. Mucosa intact. There  is perhaps small irritation of mucosa at 6:30 on lip of colostomy.   Musculoskeletal: He exhibits no edema or tenderness.  Lymphadenopathy:    He has no cervical adenopathy.  Neurological: He is alert and oriented to person, place, and time. He exhibits normal muscle tone.  Skin: Skin is warm and dry. No rash noted. He is not diaphoretic. No erythema. No pallor.  Psychiatric: He has a normal mood and affect. His behavior is normal. Judgment and thought content normal.    Assessment/Plan: GI bleed H/o stage III rectal ca s/p APR with end colostomy Anemia of chronic disease ICM Chronic heart failure CKD AICD PAF, on chronic anticoagulation OSA  DM  Appears bleeding has stopped. No gross blood in stool. No melena/hematochezia currently. ostomy surface intact except for small area of irritation at 6:30 which could have been source.  Agree with following hgb daily Defer to GI about colonoscopy pls call with questions or if bleeds from outside mucosa of colostomy.  Leighton Ruff. Redmond Pulling, MD, FACS General, Bariatric, & Minimally Invasive Surgery [facility], [place]   [facility] M 08/23/2015, 8:05 PM

## 2015-08-24 ENCOUNTER — Encounter (HOSPITAL_COMMUNITY)
Admission: EM | Disposition: A | Discharge status: Home / Self Care | Payer: Self-pay | Source: Home / Self Care | Attending: Internal Medicine

## 2015-08-24 ENCOUNTER — Inpatient Hospital Stay (HOSPITAL_COMMUNITY): Payer: Medicare Other | Admitting: Anesthesiology

## 2015-08-24 ENCOUNTER — Encounter (HOSPITAL_COMMUNITY): Payer: Self-pay

## 2015-08-24 DIAGNOSIS — G4733 Obstructive sleep apnea (adult) (pediatric): Secondary | ICD-10-CM

## 2015-08-24 HISTORY — PX: COLONOSCOPY: SHX5424

## 2015-08-24 LAB — GLUCOSE, CAPILLARY
GLUCOSE-CAPILLARY: 146 mg/dL — AB (ref 65–99)
GLUCOSE-CAPILLARY: 169 mg/dL — AB (ref 65–99)
Glucose-Capillary: 106 mg/dL — ABNORMAL HIGH (ref 65–99)
Glucose-Capillary: 110 mg/dL — ABNORMAL HIGH (ref 65–99)
Glucose-Capillary: 127 mg/dL — ABNORMAL HIGH (ref 65–99)
Glucose-Capillary: 178 mg/dL — ABNORMAL HIGH (ref 65–99)
Glucose-Capillary: 75 mg/dL (ref 65–99)

## 2015-08-24 LAB — CBC
HEMATOCRIT: 38.1 % — AB (ref 39.0–52.0)
Hemoglobin: 12.4 g/dL — ABNORMAL LOW (ref 13.0–17.0)
MCH: 32.5 pg (ref 26.0–34.0)
MCHC: 32.5 g/dL (ref 30.0–36.0)
MCV: 99.7 fL (ref 78.0–100.0)
PLATELETS: 146 10*3/uL — AB (ref 150–400)
RBC: 3.82 MIL/uL — ABNORMAL LOW (ref 4.22–5.81)
RDW: 16.4 % — AB (ref 11.5–15.5)
WBC: 7.4 10*3/uL (ref 4.0–10.5)

## 2015-08-24 LAB — TYPE AND SCREEN
ABO/RH(D): O POS
Antibody Screen: NEGATIVE
Unit division: 0

## 2015-08-24 LAB — BASIC METABOLIC PANEL
ANION GAP: 17 — AB (ref 5–15)
BUN: 45 mg/dL — AB (ref 6–20)
CO2: 14 mmol/L — ABNORMAL LOW (ref 22–32)
Calcium: 9.4 mg/dL (ref 8.9–10.3)
Chloride: 108 mmol/L (ref 101–111)
Creatinine, Ser: 3.53 mg/dL — ABNORMAL HIGH (ref 0.61–1.24)
GFR calc Af Amer: 20 mL/min — ABNORMAL LOW (ref >60)
GFR, EST NON AFRICAN AMERICAN: 17 mL/min — AB (ref >60)
Glucose, Bld: 116 mg/dL — ABNORMAL HIGH (ref 65–99)
POTASSIUM: 4.2 mmol/L (ref 3.5–5.1)
SODIUM: 139 mmol/L (ref 135–145)

## 2015-08-24 SURGERY — COLONOSCOPY
Anesthesia: Monitor Anesthesia Care

## 2015-08-24 MED ORDER — MIDAZOLAM HCL 5 MG/5ML IJ SOLN
INTRAMUSCULAR | Status: DC | PRN
  Administered 2015-08-24: 1 mg via INTRAVENOUS

## 2015-08-24 MED ORDER — PROPOFOL 10 MG/ML IV BOLUS
INTRAVENOUS | Status: DC | PRN
  Administered 2015-08-24: 20 mg via INTRAVENOUS
  Administered 2015-08-24: 30 mg via INTRAVENOUS
  Administered 2015-08-24: 20 mg via INTRAVENOUS

## 2015-08-24 MED ORDER — FENTANYL CITRATE (PF) 100 MCG/2ML IJ SOLN
INTRAMUSCULAR | Status: DC | PRN
  Administered 2015-08-24: 50 ug via INTRAVENOUS

## 2015-08-24 MED ORDER — BENZONATATE 100 MG PO CAPS
100.0000 mg | ORAL_CAPSULE | Freq: Three times a day (TID) | ORAL | Status: DC | PRN
  Administered 2015-08-24 – 2015-08-25 (×4): 100 mg via ORAL
  Filled 2015-08-24 (×4): qty 1

## 2015-08-24 MED ORDER — LACTATED RINGERS IV SOLN
INTRAVENOUS | Status: DC | PRN
Start: 2015-08-24 — End: 2015-08-24
  Administered 2015-08-24: 08:00:00 via INTRAVENOUS

## 2015-08-24 MED ORDER — APIXABAN 5 MG PO TABS
5.0000 mg | ORAL_TABLET | Freq: Two times a day (BID) | ORAL | Status: DC
  Administered 2015-08-24 – 2015-08-30 (×12): 5 mg via ORAL
  Filled 2015-08-24 (×12): qty 1

## 2015-08-24 NOTE — Interval H&P Note (Signed)
History and Physical Interval Note:  08/24/2015 9:09 AM  [name]  has presented today for surgery, with the diagnosis of Gastrointestinal bleeding  The various methods of treatment have been discussed with the patient and family. After consideration of risks, benefits and other options for treatment, the patient has consented to  Procedure(s): COLONOSCOPY (N/A) as a surgical intervention .  The patient's history has been reviewed, patient examined, no change in status, stable for surgery.  I have reviewed the patient's chart and labs.  Questions were answered to the patient's satisfaction.     Renelda Loma Armbruster

## 2015-08-24 NOTE — Transfer of Care (Signed)
Immediate Anesthesia Transfer of Care Note  Patient: [name]  Procedure(s) Performed: Procedure(s): COLONOSCOPY (N/A)  Patient Location: PACU and Endoscopy Unit  Anesthesia Type:MAC  Level of Consciousness: sedated  Airway & Oxygen Therapy: Patient Spontanous Breathing and Patient connected to nasal cannula oxygen  Post-op Assessment: Report given to RN and Post -op Vital signs reviewed and stable  Post vital signs: Reviewed and stable  Last Vitals:  Filed Vitals:   08/24/15 0805  BP: 111/59  Pulse: 75  Temp: 36.5 C  Resp: 14    Complications: No apparent anesthesia complications

## 2015-08-24 NOTE — Progress Notes (Signed)
PROGRESS NOTE  [name] TKW:[id number] DOB: 10-04-1956 DOA: 08/23/2015 PCP: Glo Herring., MD  Assessment/Plan: Bleeding from colostomy stoma/obstipation -[place] [facility] consulted-- s/p colonscopy-- bleeding stopped, seen by surgery as well- nothing further to add -resume eliquis -resume diet   Chronic hypotension -Baseline blood pressure around 80/60 -Current blood pressure stable   Anemia of chronic disease/acute blood loss anemia -stable post transfusion   Ischemic cardiomyopathy-EF 35% 08/29/25/ Chronic systolic heart failure -Appears compensated   Paroxysmal atrial fibrillation/Long term current use of anticoagulant therapy -eliquis resumed   CAD with LCX disease and stents to LAD -Currently asymptomatic and no evidence of chest pain or dyspnea on exertion   Chronic kidney disease, stage IV (severe) -BUN slightly elevated and likely related to current bleeding -Creatinine stable and near baseline -Follow labs   DM type 2, uncontrolled, with renal complications -Check CBGs while NPO provide SSI -Need to clarify Toujeo dosage but for now have ordered 20 units daily   Chronically elevated transaminase level trend   GERD  -Continue PPI   OSA on CPAP -Provide nocturnal CPAP   Hyperlipidemia -Crestor on hold   H/O ventricular fibrillation-March 2016/BiV ICD (St Jude) gen change 04/10/15 -No longer appears to be on amiodarone based on medication reconciliation sheet  Code Status: full Family Communication: patient/family Disposition Plan: home in AM?   Consultants:  GI  surgery  Procedures:      HPI/Subjective: hungry  Objective: Filed Vitals:   08/24/15 1215  BP: 106/61  Pulse: 75  Temp: 97.3 F (36.3 C)  Resp: 16    Intake/Output Summary (Last 24 hours) at 08/24/15 1247 Last data filed at 08/24/15 1130  Gross per 24 hour  Intake   2302 ml  Output      0 ml  Net   2302 ml   Filed Weights   08/23/15  0145 08/23/15 1531  Weight: 93.895 kg (207 lb) 93.895 kg (207 lb)    Exam:   General:  Awake, NAD  Cardiovascular: rrr  Respiratory: clear  Abdomen: +BS, soft  Musculoskeletal: pulses decreased   Data Reviewed: Basic Metabolic Panel:  Recent Labs Lab 08/23/15 0215 08/24/15 0336  NA 140 139  K 4.1 4.2  CL 106 108  CO2  --  14*  GLUCOSE 127* 116*  BUN 48* 45*  CREATININE 4.00* 3.53*  CALCIUM  --  9.4   Liver Function Tests: No results for input(s): AST, ALT, ALKPHOS, BILITOT, PROT, ALBUMIN in the last 168 hours. No results for input(s): LIPASE, AMYLASE in the last 168 hours. No results for input(s): AMMONIA in the last 168 hours. CBC:  Recent Labs Lab 08/23/15 0215 08/23/15 0230 08/23/15 0556 08/23/15 1356 08/24/15 0336  WBC  --  7.1 7.1 5.4 7.4  NEUTROABS  --  4.9  --  3.8  --   HGB 12.2* 10.6* 9.0* 10.7* 12.4*  HCT 36.0* 33.0* 27.9* 32.5* 38.1*  MCV  --  100.3* 99.6 97.0 99.7  PLT  --  151 129* 126* 146*   Cardiac Enzymes: No results for input(s): CKTOTAL, CKMB, CKMBINDEX, TROPONINI in the last 168 hours. BNP (last 3 results)  Recent Labs  04/23/15 0503 07/21/15 0947 08/02/15 2129  BNP 1218.9* 781.6* 1004.6*    ProBNP (last 3 results)  Recent Labs  09/27/14 0523 11/19/14 0745  PROBNP 9164.0* 5233.0*    CBG:  Recent Labs Lab 08/23/15 2019 08/24/15 0020 08/24/15 0411 08/24/15 0726 08/24/15 1136  GLUCAP 185* 127* 110* 106* 75    No  results found for this or any previous visit (from the past 240 hour(s)).   Studies: Dg Abd Acute W/chest  08/23/2015   CLINICAL DATA:  GI bleed.  Abdominal distension.  EXAM: DG ABDOMEN ACUTE W/ 1V CHEST  COMPARISON:  CT abdomen/ pelvis 07/31/2015 chest radiographs 08/02/2015  FINDINGS: Multi lead left-sided pacemaker in place. Cardiomegaly is unchanged. Previous right upper lobe opacity has resolved. The lungs are clear.  There is no free intra-abdominal air. No dilated bowel loops to suggest  obstruction. Large volume of stool in the right colon, moderate stool throughout the remainder the colon. The left lower quadrant ostomy is not well seen. No radiopaque calculi. No acute osseous abnormalities are seen. No radiopaque calculi. There are vascular calcifications. No acute osseous abnormalities are seen.  IMPRESSION: 1. Large volume of colonic stool. No bowel dilatation to suggest obstruction. 2. Resolved right upper lobe opacity.  No acute pulmonary process.   Electronically Signed   By: Jeb Levering M.D.   On: 08/23/2015 03:12    Scheduled Meds: . apixaban  5 mg Oral BID  . insulin aspart  0-9 Units Subcutaneous 6 times per day  . insulin glargine  20 Units Subcutaneous Q24H  . metoprolol succinate  25 mg Oral BID  . pantoprazole (PROTONIX) IV  40 mg Intravenous Q24H  . polyethylene glycol  17 g Oral BID  . sodium chloride  3 mL Intravenous Q12H   Continuous Infusions:  Antibiotics Given (last 72 hours)    None      Principal Problem:   Bleeding from colostomy stoma Active Problems:   Ischemic cardiomyopathy-EF 35% 04/24/15 echo   Long term current use of anticoagulant therapy   CAD with LCX disease and stents to LAD   Chronic systolic heart failure   Paroxysmal atrial fibrillation   Chronic hypotension   Chronic kidney disease, stage IV (severe)   DM type 2, uncontrolled, with renal complications   GERD (gastroesophageal reflux disease)   OSA on CPAP   Hyperlipidemia   H/O ventricular fibrillation-March 2016   BiV ICD (St Jude) gen change 04/10/15   Chronically elevated transaminase level-Amiodarone resumed 04/24/15   Constipation   Anemia of chronic disease    Time spent: 25 min    [name], [name]  [facility] Pager [phone]. If 7PM-7AM, please contact night-coverage at [email or web address], password [facility] 08/24/2015, 12:47 PM  LOS: 1 day

## 2015-08-24 NOTE — Anesthesia Preprocedure Evaluation (Addendum)
Anesthesia Evaluation  Patient identified by MRN, date of birth, ID band Patient awake    Reviewed: Allergy & Precautions, NPO status , Patient's Chart, lab work & pertinent test results, reviewed documented beta blocker date and time   Airway Mallampati: II  TM Distance: <3 FB Neck ROM: Full    Dental  (+) Missing, Chipped, Poor Dentition, Dental Advisory Given   Pulmonary sleep apnea and Continuous Positive Airway Pressure Ventilation ,    Pulmonary exam normal breath sounds clear to auscultation       Cardiovascular Exercise Tolerance: Poor hypertension, Pt. on medications and Pt. on home beta blockers + CAD, + Past MI, + Cardiac Stents and +CHF  Normal cardiovascular exam+ Cardiac Defibrillator  Rhythm:Regular Rate:Normal     Neuro/Psych TIA   GI/Hepatic Neg liver ROS, GERD  ,GIB   Endo/Other  diabetes, Type 2, Insulin Dependent  Renal/GU Renal disease (CKD Stage IV)     Musculoskeletal negative musculoskeletal ROS (+)   Abdominal   Peds  Hematology  (+) Blood dyscrasia, anemia ,   Anesthesia Other Findings Day of surgery medications reviewed with the patient.  Reproductive/Obstetrics                      Anesthesia Physical Anesthesia Plan  ASA: IV  Anesthesia Plan: MAC   Post-op Pain Management:    Induction: Intravenous  Airway Management Planned: Nasal Cannula  Additional Equipment:   Intra-op Plan:   Post-operative Plan:   Informed Consent: I have reviewed the patients History and Physical, chart, labs and discussed the procedure including the risks, benefits and alternatives for the proposed anesthesia with the patient or authorized representative who has indicated his/her understanding and acceptance.   Dental advisory given  Plan Discussed with: CRNA and Anesthesiologist  Anesthesia Plan Comments: (Discussed risks/benefits/alternatives to MAC sedation including need  for ventilatory support, hypotension, need for conversion to general anesthesia.  All patient questions answered.  Patient wished to proceed.)        Anesthesia Quick Evaluation

## 2015-08-24 NOTE — H&P (View-Only) (Signed)
Consultation  Referring Provider:   Triad [name]   Primary Care Physician:  [name]., MD Primary Gastroenterologist:   Robert Cornea, MD      Reason for Consultation: GI bleed             HPI:   [name] is a 59 y.o. male with multiple significant medical problems as listed in Onward. She also has  including history of stage III rectal cancer, s/p APR / chemotherapy. Patient followed by [name].   Patient was hospitalized with heart failure in August. During that time hgb found in 7 range on Eliquis. Patient was transfused. EGD and colonoscopy had been done in January so neither repeated that admission.  Inpatient Capsule [name] revealed a small AVM in small bowel, mild gastritis, a benign appearing small bowel nodule and questionable colon polyp.   Patient in ED now with blood in stool. Patient began having painless bleeding yesterday. He apparently passed some large clots. Last Eliquis dose yesterday. No nausea or vomiting. Patient denies constipation, ostomy output "same as always" but KUB shows a large stool burden.   Past Medical History  Diagnosis Date  . Essential hypertension, benign   . CAD (coronary artery disease)     a. BMS to LAD 2001 at [facility] b. PTCA/atherectomy ramus and BMS to LAD 2009  . Chronic systolic heart failure   . GERD (gastroesophageal reflux disease)   . Paroxysmal atrial fibrillation     a. on amiodarone, digoxin and Eliquis  . Adenocarcinoma of rectum     a. 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 Prostate cancer     a. s/p seed implants with chemo and radiation  . TIA (transient ischemic attack)   . OSA on CPAP   . HLD (hyperlipidemia)   . Orthostatic hypotension   . Dizziness     a. chronic. Admission for this 07/18/2014  . History of falling July 2015    due to dizziness from medications  . Ischemic cardiomyopathy     EF 18% by nuclear study 2016, multiple myocardial infarctions in past    . Chronic kidney disease, stage IV (severe) 12/11/2013    . DM type 2, uncontrolled, with renal complications 9/41/7408  . Obesity 12/12/2013  . HCAP (healthcare-associated pneumonia) 07/21/2015  . AICD (automatic cardioverter/defibrillator) present 2009    BiVentricular (upgrade)  . 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 History of blood transfusion     "I've had 2 units so far this year" (07/21/2015)  . Anemia     Past Surgical History  Procedure Laterality Date  . Internal defibrillator and pacemaker  2002  . Abdominal and perineal resection of rectum with total mesorectal excision  10/04/2007  . Colonoscopy  09/14/2011    Dr. Gala [name]: via colostomy, Single pedunculated benign inflammatory polyp. Due for surveillance Oct 2015  . Colostomy  2008  . Colonoscopy N/A 07/02/2014    Procedure: COLONOSCOPY;  Surgeon: Robert Dolin, MD;  Location: [name] SU[facility];  Service: Endoscopy;  Laterality: N/A;  7:30 / COLONOSCOPY THRU COLOSTOMY  . Esophagogastroduodenoscopy N/A 07/02/2014    Procedure: ESOPHAGOGASTRODUODENOSCOPY (EGD);  Surgeon: Robert Dolin, MD;  Location: [name];  Service: Endoscopy;  Laterality: N/A;  7:30  . Savory dilation N/A 07/02/2014    Procedure: SAVORY DILATION;  Surgeon: Robert Dolin, MD;  Location: [name];  Service: Endoscopy;  Laterality: N/A;  7:30  . Maloney dilation N/A 07/02/2014  Procedure: MALONEY DILATION;  Surgeon: Robert Dolin, MD;  Location: [name];  Service: Endoscopy;  Laterality: N/A;  7:30  . Portacath placement  06/2007    "removed ~ 1 yr later"  . Left heart catheterization with coronary angiogram N/A 07/13/2013    Procedure: LEFT HEART CATHETERIZATION WITH CORONARY ANGIOGRAM;  Surgeon: Robert Harp, MD;  Location: [facility] CATH LAB;  Service: Cardiovascular;  Laterality: N/A;  . Colonoscopy N/A 12/11/2014    Dr. Gala [name] via colostomy. Normal. Repeat in 2021.   Marland Kitchen Esophagogastroduodenoscopy N/A 12/11/2014    [id number] EGD  . Cardiac defibrillator placement  2009     Upgraded to a BiV ICD  . Right heart catheterization N/A 02/24/2015    Procedure: RIGHT HEART CATH;  Surgeon: Robert Artist, MD;  Location: [facility] CATH LAB;  Service: Cardiovascular;  Laterality: N/A;  . Ep implantable device N/A 04/10/2015    Procedure: Ppm/Biv Ppm Generator Changeout;  Surgeon: Robert Lance, MD;  Location: [place] INVASIVE CV LAB CUPID;  Service: Cardiovascular;  Laterality: N/A;  . Bi-ventricular implantable cardioverter defibrillator  (crt-d)  2009  . Cardiac catheterization  08/2001; 2009    ; /notes 07/10/2013  . Coronary angioplasty with stent placement  2001; ~ 2006    "1 + 1"   . Givens capsule study N/A 07/23/2015    Procedure: GIVENS CAPSULE STUDY;  Surgeon: Robert Corner, MD;  Location: [facility] ENDOSCOPY;  Service: Endoscopy;  Laterality: N/A;    Family History  Problem Relation Age of Onset  . Colon cancer Mother 3  . Colon cancer Sister 77  . Coronary artery disease Father   . Colon cancer Other     2 cousins, succumbed to illness     Social History  Substance Use Topics  . Smoking status: Never Smoker   . Smokeless tobacco: Never Used  . Alcohol Use: No     Comment: Former user 45 years ago    Prior to Admission medications   Medication Sig Start Date End Date Taking? Authorizing Provider  apixaban (ELIQUIS) 5 MG TABS tablet Take 5 mg by mouth 2 (two) times daily.   Yes Historical Provider, MD  BIDIL 20-37.5 MG per tablet Take 0.5 tablets by mouth 3 (three) times daily.  07/28/15  Yes Historical Provider, MD  co-enzyme Q-10 50 MG capsule Take 50 mg by mouth every morning.    Yes Historical Provider, MD  ferrous sulfate 325 (65 FE) MG tablet Take 1 tablet (325 mg total) by mouth 2 (two) times daily with a meal. 07/24/15  Yes Robert Kristeen Mans, MD  fluticasone (FLONASE) 50 MCG/ACT nasal spray Place 1 spray into both nostrils daily as needed for allergies.  11/21/13  Yes Historical Provider, MD  furosemide (LASIX) 40 MG tablet Take 1 tablet (40 mg total) by  mouth daily. Take an additional pill with an additional potassium pill if you gain over 2 pounds in 24 hours. 08/04/15  Yes Thurnell Lose, MD  Insulin Glargine (TOUJEO SOLOSTAR [place]) Inject 30 Units into the skin every morning.   Yes Historical Provider, MD  isosorbide dinitrate (ISORDIL) 30 MG tablet Take 0.5 tablets by mouth 2 (two) times daily. 07/27/15  Yes Historical Provider, MD  metoprolol succinate (TOPROL-XL) 25 MG 24 hr tablet Take 25 mg by mouth 2 (two) times daily.  06/24/15  Yes Historical Provider, MD  Omega-3 Fatty Acids (FISH OIL) 1000 MG CAPS Take 1,000 mg by mouth 2 (two) times daily.   Yes Historical  Provider, MD  pantoprazole (PROTONIX) 40 MG tablet Take 1 tablet (40 mg total) by mouth daily. 07/24/15  Yes Robert Kristeen Mans, MD  PROAIR RESPICLICK 128 (90 BASE) MCG/ACT AEPB Inhale 2 puffs into the lungs every 6 (six) hours as needed. 08/15/15  Yes Historical Provider, MD  rosuvastatin (CRESTOR) 40 MG tablet Take 40 mg by mouth at bedtime.   Yes Historical Provider, MD  nitroGLYCERIN (NITROLINGUAL) 0.4 MG/SPRAY spray Place 1 spray under the tongue every 5 (five) minutes as needed. angina Patient not taking: Reported on 08/14/2015 07/19/14   Arnoldo Lenis, MD    Current Facility-Administered Medications  Medication Dose Route Frequency Provider Last Rate Last Dose  . 0.9 %  sodium chloride infusion   Intravenous Continuous Samella Parr, NP   Stopped at 08/23/15 1034  . acetaminophen (TYLENOL) tablet 650 mg  650 mg Oral Q6H PRN Samella Parr, NP       Or  . acetaminophen (TYLENOL) suppository 650 mg  650 mg Rectal Q6H PRN Samella Parr, NP      . albuterol (PROVENTIL) (2.5 MG/3ML) 0.083% nebulizer solution 2.5 mg  2.5 mg Nebulization Q6H PRN Hosie Poisson, MD      . fluticasone (FLONASE) 50 MCG/ACT nasal spray 1 spray  1 spray Each Nare Daily PRN Samella Parr, NP      . insulin aspart (novoLOG) injection 0-9 Units  0-9 Units Subcutaneous 6 times per day Samella Parr, NP    0 Units at 08/23/15 0843  . insulin glargine (LANTUS) injection 20 Units  20 Units Subcutaneous Q24H Hosie Poisson, MD   20 Units at 08/23/15 1141  . metoprolol succinate (TOPROL-XL) 24 hr tablet 25 mg  25 mg Oral BID Samella Parr, NP   25 mg at 08/23/15 1033  . ondansetron (ZOFRAN) tablet 4 mg  4 mg Oral Q6H PRN Samella Parr, NP       Or  . ondansetron [facility]) injection 4 mg  4 mg Intravenous Q6H PRN Samella Parr, NP      . pantoprazole (PROTONIX) injection 40 mg  40 mg Intravenous Q24H Ivor Costa, MD   40 mg at 08/23/15 [id number]  . polyethylene glycol (MIRALAX / GLYCOLAX) packet 17 g  17 g Oral BID Samella Parr, NP   17 g at 08/23/15 1144  . sodium chloride 0.9 % injection 3 mL  3 mL Intravenous Q12H Samella Parr, NP   3 mL at 08/23/15 0944   Current Outpatient Prescriptions  Medication Sig Dispense Refill  . apixaban (ELIQUIS) 5 MG TABS tablet Take 5 mg by mouth 2 (two) times daily.    Marland Kitchen BIDIL 20-37.5 MG per tablet Take 0.5 tablets by mouth 3 (three) times daily.     Marland Kitchen co-enzyme Q-10 50 MG capsule Take 50 mg by mouth every morning.     . ferrous sulfate 325 (65 FE) MG tablet Take 1 tablet (325 mg total) by mouth 2 (two) times daily with a meal. 60 tablet 0  . fluticasone (FLONASE) 50 MCG/ACT nasal spray Place 1 spray into both nostrils daily as needed for allergies.     . furosemide (LASIX) 40 MG tablet Take 1 tablet (40 mg total) by mouth daily. Take an additional pill with an additional potassium pill if you gain over 2 pounds in 24 hours. 30 tablet 1  . Insulin Glargine (TOUJEO SOLOSTAR [place]) Inject 30 Units into the skin every morning.    . isosorbide dinitrate (  ISORDIL) 30 MG tablet Take 0.5 tablets by mouth 2 (two) times daily.  0  . metoprolol succinate (TOPROL-XL) 25 MG 24 hr tablet Take 25 mg by mouth 2 (two) times daily.   0  . Omega-3 Fatty Acids (FISH OIL) 1000 MG CAPS Take 1,000 mg by mouth 2 (two) times daily.    . pantoprazole (PROTONIX) 40 MG tablet Take 1 tablet (40  mg total) by mouth daily. 30 tablet 0  . PROAIR RESPICLICK 010 (90 BASE) MCG/ACT AEPB Inhale 2 puffs into the lungs every 6 (six) hours as needed.  0  . rosuvastatin (CRESTOR) 40 MG tablet Take 40 mg by mouth at bedtime.    . nitroGLYCERIN (NITROLINGUAL) 0.4 MG/SPRAY spray Place 1 spray under the tongue every 5 (five) minutes as needed. angina (Patient not taking: Reported on 08/14/2015) 12 g 3    Allergies as of 08/23/2015  . (No Known Allergies)    Review of Systems:    As per HPI, otherwise negative    Physical Exam:  Vital signs in last 24 hours: Temp:  [97.7 F (36.5 C)-98 F (36.7 C)] 97.9 F (36.6 C) (09/17 1115) Pulse Rate:  [92-121] 99 (09/17 1200) Resp:  [0-25] 18 (09/17 1200) BP: (75-101)/(42-70) 82/68 mmHg (09/17 1200) SpO2:  [95 %-100 %] 100 % (09/17 1200) Weight:  [207 lb (93.895 kg)] 207 lb (93.895 kg) (09/17 0145)   General:   Pleasant white in NAD Head:  Normocephalic and atraumatic. Eyes:   No icterus.   Conjunctiva pink. Ears:  Normal auditory acuity. Neck:  Supple Lungs:  Respirations even and unlabored. Lungs clear to auscultation bilaterally.   No wheezes, crackles, or rhonchi.  Heart:  Regular rate and rhythm Abdomen:  Soft, nondistended, nontender. Normal bowel sounds. Colostomy site appears health. Soft, brown stool mixed with blood on digital exam  Rectal:  Not performed.  Msk:  Symmetrical without gross deformities.  Extremities:  Without edema. Neurologic:  Alert and  oriented x4;  grossly normal neurologically. Skin:  Intact without significant lesions or rashes. Psych:  Alert and cooperative. Normal affect.  LAB RESULTS:  Recent Labs  08/23/15 0215 08/23/15 0230 08/23/15 0556  WBC  --  7.1 7.1  HGB 12.2* 10.6* 9.0*  HCT 36.0* 33.0* 27.9*  PLT  --  151 129*   BMET  Recent Labs  08/23/15 0215  NA 140  K 4.1  CL 106  GLUCOSE 127*  BUN 48*  CREATININE 4.00*   PT/INR  Recent Labs  08/23/15 0230  LABPROT 21.0*  INR 1.82*     STUDIES: Dg Abd Acute W/chest  08/23/2015   CLINICAL DATA:  GI bleed.  Abdominal distension.  EXAM: DG ABDOMEN ACUTE W/ 1V CHEST  COMPARISON:  CT abdomen/ pelvis 07/31/2015 chest radiographs 08/02/2015  FINDINGS: Multi lead left-sided pacemaker in place. Cardiomegaly is unchanged. Previous right upper lobe opacity has resolved. The lungs are clear.  There is no free intra-abdominal air. No dilated bowel loops to suggest obstruction. Large volume of stool in the right colon, moderate stool throughout the remainder the colon. The left lower quadrant ostomy is not well seen. No radiopaque calculi. No acute osseous abnormalities are seen. No radiopaque calculi. There are vascular calcifications. No acute osseous abnormalities are seen.  IMPRESSION: 1. Large volume of colonic stool. No bowel dilatation to suggest obstruction. 2. Resolved right upper lobe opacity.  No acute pulmonary process.   Electronically Signed   By: Jeb Levering M.D.   On: 08/23/2015 03:12  PREVIOUS ENDOSCOPIES:             Last surveillance colonoscopy January 2016 by Dr. Gala [name] revealed normal colon from colostomy to cecum.   Inpatient capsule endoscopy Aug 2016 revealed small nonbleeding SB AVM, mild gastritis, a benign-appearing small bowel nodule and questionable colon polyp.    Impression / Plan:   75. 59 year old male with history of stage III rectal cancer diagnosed 2008, s/p chemoradiation and APR. CTscan in late April 2016 revealed a perirectal nodule suspicious for recurrent cancer. Followup CTscan in August revealed slight enlargement of that nodule. [name] is following closely outpatient.   2.  GI bleed on Eliquis. Painless bleeding into colostomy since yesterday. Blood mixed with stool in digital exam of ostomy. Patient has chronic anemia. Baseline hgb 9-10 range. Hgb 10.6 at 2:30am today, 9.0 at 6am.  .For futher evaluation patient will be scheduled for colonoscopy. Will scheduled tentatively for tomorrow  with MAC. Bowel prep this afternoon.   Monitor H&H, transfuse if needed.   3. Multiple significant medical problems not limited to CAD / CHF / atrial fibrillation / AICD placement /chronic anemia / CKD  4. Cholelithiasis, asymptomatic at present   5. Hx of prostate cancer, s/p radiation  Thanks   LOS: 0 days   Tye Savoy  08/23/2015, 12:40 PM   Attending Addendum: I have taken an interval history, reviewed the chart, and examined the patient. I agree with the Advanced Practitioner's note and impression. Significant cardiac history on Eliquis, with anemia s/p negative EGD and colonoscopy in January. Capsule showed small bowel AVM of unclear significance. He has had overt hematochezia today. On exam in the ER he has completely brown stool coming out of the ostomy however when his ostomy was examined and manipulated red blood was expressed. Unclear if this is luminal GI bleeding or perhaps an issue with the ostomy? I would still bowel prep him and see contents and prepare for colonoscopy tomorrow if possible, however may be good to have general surgery see him and evaluate the ostomy. Cardiology should also be aware of him given his significant comorbidities and that eliquis is being held. Please resuscitate and transfuse as needed. Thanks. Please call with any changes in his status in the interim.   Robert Cellar, MD [facility] Gastroenterology Pager [phone]

## 2015-08-24 NOTE — Progress Notes (Signed)
Patient has refused to wear CPAP tonight. He says he can't wear our CPAP. RT will continue to monitor as needed.

## 2015-08-24 NOTE — Anesthesia Postprocedure Evaluation (Signed)
  Anesthesia Post-op Note  Patient: BUFORD BREMER  Procedure(s) Performed: Procedure(s) (LRB): COLONOSCOPY (N/A)  Patient Location: PACU  Anesthesia Type: MAC  Level of Consciousness: awake and alert   Airway and Oxygen Therapy: Patient Spontanous Breathing  Post-op Pain: mild  Post-op Assessment: Post-op Vital signs reviewed, Patient's Cardiovascular Status Stable, Respiratory Function Stable, Patent Airway and No signs of Nausea or vomiting  Last Vitals:  Filed Vitals:   08/24/15 0910  BP: 116/72  Pulse: 75  Temp:   Resp: 14    Post-op Vital Signs: stable   Complications: No apparent anesthesia complications

## 2015-08-24 NOTE — Progress Notes (Signed)
See Dr. Dois Davenport more complete note from 08/23/2015.  He is a patient of Dr. Audie Pinto.  Called because of concern about ostomy bleeding.  Dr. Havery Moros did colonoscopy today, which was negative for colonic cause of bleed, and saw some bleeding.    But I see no bleeding at this time and nothing to suture.  Patient is to restart Eliquis.   LLQ colostomy  Call if further questions about bleeding.  Alphonsa Overall, MD, [facility] Surgery Pager: [phone] Office phone:  [phone]

## 2015-08-24 NOTE — Op Note (Addendum)
[facility] [place], [address]   COLONOSCOPY PROCEDURE REPORT  PATIENT: [name], [name]  MR#: [id number] BIRTHDATE: Mar 21, 1956 , 58  yrs. old GENDER: male ENDOSCOPIST: [place] Cellar, MD REFERRED BY: PROCEDURE DATE:  08/24/2015 PROCEDURE:   Colonoscopy, diagnostic  ASA CLASS:   Class III INDICATIONS:58 y/o male with colostomy, unspecified GI bleeding. MEDICATIONS: Per Anesthesia  DESCRIPTION OF PROCEDURE:   After the risks benefits and alternatives of the procedure were thoroughly explained, informed consent was obtained.  The digital rectal exam was not performed        The [employer] Adult Colon [id number]  endoscope was introduced through the colostomy and advanced to the cecum, which was identified by both the appendix and ileocecal valve. No adverse events experienced.   The quality of the prep was adequate  The instrument was then slowly withdrawn as the colon was fully examined. Estimated blood loss is zero unless otherwise noted in this procedure report.      COLON FINDINGS: The examined colonic mucosa was normal.  The AO and IC valve were visualized however due to looping the wall of the cecum just beyond the valve was not well visualized.  Otherwise a small 67mm polyp was noted in the transverse colon but not removed given the patient's recent bleeding and Eliquis use.  The remainder of the examined colon was normal.  Upon withdrawal of the endoscope, there was active bleeding at the 6 o'clock position of the stoma from small cut / irritation in the area.  With pressure hemostasis was achieved.     The time to cecum =    Withdrawal time =      The scope was withdrawn and the procedure completed.  COMPLICATIONS: There were no immediate complications.  ENDOSCOPIC IMPRESSION: Diminutive polyp not removed given recent significant bleeding and Eliquis use, to not confound the picture Active bleeding noted from irritation /  small cut at the 6 o'clock position of the stoma, which is the likely source of his bleeding in the setting of Eliquis use. With manual pressure hemostasis was achieved  RECOMMENDATIONS: Return to ward Resume diet Surgical team notified that source of bleeding is the stoma and defer to their management of this issue and to determine timing of resumption of Eliquis Follow up for colonoscopy in the future for removal of diminutive polyp, follow up with primary GI physician  eSigned:  [place] Cellar, MD 08/24/2015 9:07 AM Revised: 08/24/2015 9:07 AM  cc:   PATIENT NAME:  [name], [name] MR#: [id number]

## 2015-08-25 ENCOUNTER — Encounter (HOSPITAL_COMMUNITY): Payer: Self-pay | Admitting: Gastroenterology

## 2015-08-25 ENCOUNTER — Other Ambulatory Visit (HOSPITAL_COMMUNITY): Payer: Self-pay | Admitting: *Deleted

## 2015-08-25 LAB — GLUCOSE, CAPILLARY
GLUCOSE-CAPILLARY: 96 mg/dL (ref 65–99)
GLUCOSE-CAPILLARY: 129 mg/dL — AB (ref 65–99)
GLUCOSE-CAPILLARY: 121 mg/dL — AB (ref 65–99)
Glucose-Capillary: 122 mg/dL — ABNORMAL HIGH (ref 65–99)
Glucose-Capillary: 131 mg/dL — ABNORMAL HIGH (ref 65–99)

## 2015-08-25 LAB — BASIC METABOLIC PANEL
Anion gap: 13 (ref 5–15)
BUN: 60 mg/dL — AB (ref 6–20)
CALCIUM: 9.2 mg/dL (ref 8.9–10.3)
CO2: 17 mmol/L — AB (ref 22–32)
CREATININE: 4.17 mg/dL — AB (ref 0.61–1.24)
Chloride: 100 mmol/L — ABNORMAL LOW (ref 101–111)
GFR calc non Af Amer: 14 mL/min — ABNORMAL LOW (ref >60)
GFR, EST AFRICAN AMERICAN: 16 mL/min — AB (ref >60)
Glucose, Bld: 148 mg/dL — ABNORMAL HIGH (ref 65–99)
Potassium: 5 mmol/L (ref 3.5–5.1)
SODIUM: 130 mmol/L — AB (ref 135–145)

## 2015-08-25 MED ORDER — SODIUM BICARBONATE 650 MG PO TABS
650.0000 mg | ORAL_TABLET | Freq: Three times a day (TID) | ORAL | Status: DC
  Administered 2015-08-25 – 2015-08-30 (×14): 650 mg via ORAL
  Filled 2015-08-25 (×14): qty 1

## 2015-08-25 MED ORDER — METOPROLOL SUCCINATE ER 25 MG PO TB24: 25.0000 mg | ORAL_TABLET | Freq: Two times a day (BID) | ORAL | Status: DC

## 2015-08-25 MED ORDER — FUROSEMIDE 10 MG/ML IJ SOLN
80.0000 mg | Freq: Two times a day (BID) | INTRAMUSCULAR | Status: DC
Start: 2015-08-25 — End: 2015-08-26
  Administered 2015-08-25 – 2015-08-26 (×2): 80 mg via INTRAVENOUS
  Filled 2015-08-25 (×2): qty 8

## 2015-08-25 MED ORDER — FUROSEMIDE 10 MG/ML IJ SOLN
40.0000 mg | Freq: Once | INTRAMUSCULAR | Status: AC
  Administered 2015-08-25: 40 mg via INTRAVENOUS
  Filled 2015-08-25: qty 4

## 2015-08-25 MED ORDER — METOPROLOL SUCCINATE ER 25 MG PO TB24
12.5000 mg | ORAL_TABLET | Freq: Two times a day (BID) | ORAL | Status: DC
  Administered 2015-08-25 – 2015-08-27 (×4): 12.5 mg via ORAL
  Filled 2015-08-25 (×4): qty 1

## 2015-08-25 MED ORDER — PANTOPRAZOLE SODIUM 40 MG PO TBEC
40.0000 mg | DELAYED_RELEASE_TABLET | Freq: Every day | ORAL | Status: DC
  Administered 2015-08-25 – 2015-08-30 (×6): 40 mg via ORAL
  Filled 2015-08-25 (×5): qty 1

## 2015-08-25 NOTE — Progress Notes (Signed)
40mg  Lasix IV given at 0900 today.  Pt voided around 0800 (175cc) but has not voided since lasix was given.  Bladder scan revealed 85-100cc.  Dr. Eliseo Squires notified.  New orders received.  Pt also feeling nauseated but does not want Zofran because he says that it's not working for him.  Appetite decreased.  Will continue to monitor.

## 2015-08-25 NOTE — Progress Notes (Signed)
Pt requested that bed alarm be turned off because it caused a flashing light under the bed and prevents him from sleeping. Pt was educated. Will continue to monitor.  [name]

## 2015-08-25 NOTE — Consult Note (Signed)
[facility] CONSULT NOTE    Date: 08/25/2015                  Patient Name:  [name]  MRN: [id number]  DOB: 10-09-1956  Age / Sex: 59 y.o., male         PCP: Glo Herring., MD                 Service Requesting Consult: Dr. Eliseo Squires- [facility]                  Reason for Consult: Worsening Cr, hx CKD Stage IV            History of Present Illness: [name] is a 59 y.o. man with a PMHx of chronic systolic HF (EF 28%), ischemic cardiomyopathy with AICD, paroxysmal AFib on Eliquis, adenocarcinoma of rectum s/p colostomy in 2008, type 2 DM, and CKD Stage IV who was admitted to [facility] on 08/23/2015 for evaluation of painless bleeding from his colostomy site. Hgb at baseline on admission at 10.6, but then dropped to 9.0 a few hours later. Bleeding subsided from his colostomy but then he was noted to have blood in his stool. GI was consulted and patient underwent colonoscopy on 9/19 which showed the bleeding was coming from the stoma. Surgery was notified and felt there was no bleeding coming from his colostomy site and thus Eliquis was resumed.   Patient noted to have a worsening Cr from his baseline. Cr 4.00 on admission, then dropped to 3.53 and then rose again to 4.17 today. His baseline Cr has varied widely over past year in 2.4-3.0 range. UA on admission with minimal protein, otherwise normal. Patient reports decreased urinary frequency since hospital admission. He states he normally urinates 6-7 times per day and now only going twice a day. He notes increased swelling in his legs and mild SOB. He notes he has gained 7 lbs recently (weight 207 lbs), baseline weight is 198 lbs per cardiology notes. He reports nausea, but feels it is related to the colonoscopy. He denies having nausea/vomiting prior to hospitalization and reports good appetite. He denies daytime sleepiness. He denies any dysuria or hematuria. He denies NSAID use. He notes he normally takes lasix 40 mg  daily at home. He has only received lasix 40 mg IV once today during this admission. Patient states his nephrologist told him he would likely be on dialysis by the end of the year.     Medications: Outpatient medications: Prescriptions prior to admission  Medication Sig Dispense Refill Last Dose  . apixaban (ELIQUIS) 5 MG TABS tablet Take 5 mg by mouth 2 (two) times daily.   08/22/2015 at 2130  . BIDIL 20-37.5 MG per tablet Take 0.5 tablets by mouth 3 (three) times daily.    08/22/2015 at Unknown time  . co-enzyme Q-10 50 MG capsule Take 50 mg by mouth every morning.    08/22/2015 at Unknown time  . ferrous sulfate 325 (65 FE) MG tablet Take 1 tablet (325 mg total) by mouth 2 (two) times daily with a meal. 60 tablet 0 08/22/2015 at Unknown time  . fluticasone (FLONASE) 50 MCG/ACT nasal spray Place 1 spray into both nostrils daily as needed for allergies.    08/22/2015 at Unknown time  . furosemide (LASIX) 40 MG tablet Take 1 tablet (40 mg total) by mouth daily. Take an additional pill with an additional potassium pill if you gain over 2 pounds in 24 hours. [place]  tablet 1 08/22/2015 at Unknown time  . Insulin Glargine (TOUJEO SOLOSTAR [place]) Inject 30 Units into the skin every morning.   08/22/2015 at Unknown time  . isosorbide dinitrate (ISORDIL) 30 MG tablet Take 0.5 tablets by mouth 2 (two) times daily.  0 08/22/2015 at Unknown time  . Omega-3 Fatty Acids (FISH OIL) 1000 MG CAPS Take 1,000 mg by mouth 2 (two) times daily.   08/22/2015 at Unknown time  . pantoprazole (PROTONIX) 40 MG tablet Take 1 tablet (40 mg total) by mouth daily. 30 tablet 0 08/22/2015 at Unknown time  . PROAIR RESPICLICK 027 (90 BASE) MCG/ACT AEPB Inhale 2 puffs into the lungs every 6 (six) hours as needed.  0 08/22/2015 at Unknown time  . rosuvastatin (CRESTOR) 40 MG tablet Take 40 mg by mouth at bedtime.   08/22/2015 at Unknown time  . nitroGLYCERIN (NITROLINGUAL) 0.4 MG/SPRAY spray Place 1 spray under the tongue every 5 (five) minutes as  needed. angina (Patient not taking: Reported on 08/14/2015) 12 g 3 Not Taking at Unknown time    Current medications: Current Facility-Administered Medications  Medication Dose Route Frequency Provider Last Rate Last Dose  . acetaminophen (TYLENOL) tablet 650 mg  650 mg Oral Q6H PRN Samella Parr, NP       Or  . acetaminophen (TYLENOL) suppository 650 mg  650 mg Rectal Q6H PRN Samella Parr, NP      . albuterol (PROVENTIL) (2.5 MG/3ML) 0.083% nebulizer solution 2.5 mg  2.5 mg Nebulization Q6H PRN Hosie Poisson, MD      . apixaban (ELIQUIS) tablet 5 mg  5 mg Oral BID Geradine Girt, DO   5 mg at 08/25/15 1040  . benzonatate (TESSALON) capsule 100 mg  100 mg Oral TID PRN Geradine Girt, DO   100 mg at 08/25/15 1040  . fluticasone (FLONASE) 50 MCG/ACT nasal spray 1 spray  1 spray Each Nare Daily PRN Samella Parr, NP      . insulin aspart (novoLOG) injection 0-9 Units  0-9 Units Subcutaneous 6 times per day Samella Parr, NP   1 Units at 08/25/15 0409  . insulin glargine (LANTUS) injection 20 Units  20 Units Subcutaneous Q24H Hosie Poisson, MD   20 Units at 08/25/15 1240  . metoprolol succinate (TOPROL-XL) 24 hr tablet 12.5 mg  12.5 mg Oral BID Geradine Girt, DO   12.5 mg at 08/25/15 1040  . ondansetron (ZOFRAN) tablet 4 mg  4 mg Oral Q6H PRN Samella Parr, NP       Or  . ondansetron [facility]) injection 4 mg  4 mg Intravenous Q6H PRN Samella Parr, NP   4 mg at 08/24/15 1749  . pantoprazole (PROTONIX) EC tablet 40 mg  40 mg Oral Daily Geradine Girt, DO   40 mg at 08/25/15 1040  . polyethylene glycol (MIRALAX / GLYCOLAX) packet 17 g  17 g Oral BID Samella Parr, NP   17 g at 08/23/15 1144  . sodium chloride 0.9 % injection 3 mL  3 mL Intravenous Q12H Samella Parr, NP   3 mL at 08/25/15 1000      Allergies: No Known Allergies    Past Medical History: Past Medical History  Diagnosis Date  . Essential hypertension, benign   . CAD (coronary artery disease)     a. BMS to LAD  2001 [facility] b. PTCA/atherectomy ramus and BMS to LAD 2009  . Chronic systolic heart failure   .  GERD (gastroesophageal reflux disease)   . Paroxysmal atrial fibrillation     a. on amiodarone, digoxin and Eliquis  . Adenocarcinoma of rectum     a. 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 Prostate cancer     a. s/p seed implants with chemo and radiation  . TIA (transient ischemic attack)   . OSA on CPAP   . HLD (hyperlipidemia)   . Orthostatic hypotension   . Dizziness     a. chronic. Admission for this 07/18/2014  . History of falling July 2015    due to dizziness from medications  . Ischemic cardiomyopathy     EF 18% by nuclear study 2016, multiple myocardial infarctions in past    . Chronic kidney disease, stage IV (severe) 12/11/2013  . DM type 2, uncontrolled, with renal complications 5/00/9381  . Obesity 12/12/2013  . Hematuria 03/2015  . HCAP (healthcare-associated pneumonia) 07/21/2015  . AICD (automatic cardioverter/defibrillator) present 2009    BiVentricular (upgrade)  . 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 History of blood transfusion     "I've had 2 units so far this year" (07/21/2015)  . Anemia      Past Surgical History: Past Surgical History  Procedure Laterality Date  . Internal defibrillator and pacemaker  2002  . Abdominal and perineal resection of rectum with total mesorectal excision  10/04/2007  . Colonoscopy  09/14/2011    Dr. Gala Romney: via colostomy, Single pedunculated benign inflammatory polyp. Due for surveillance Oct 2015  . Colostomy  2008  . Colonoscopy N/A 07/02/2014    Procedure: COLONOSCOPY;  Surgeon: Daneil Dolin, MD;  [facility];  Service: Endoscopy;  Laterality: N/A;  7:30 / COLONOSCOPY THRU COLOSTOMY  . Esophagogastroduodenoscopy N/A 07/02/2014    Procedure: ESOPHAGOGASTRODUODENOSCOPY (EGD);  Surgeon: Daneil Dolin, MD;  [facility];  Service: Endoscopy;  Laterality: N/A;  7:30  . [place]ory dilation N/A 07/02/2014     Procedure: SAVORY DILATION;  Surgeon: Daneil Dolin, MD;  [facility];  Service: Endoscopy;  Laterality: N/A;  7:30  . [place] dilation N/A 07/02/2014    Procedure: Venia Minks DILATION;  Surgeon: Daneil Dolin, MD;  [facility];  Service: Endoscopy;  Laterality: N/A;  7:30  . Portacath placement  06/2007    "removed ~ 1 yr later"  . Left heart catheterization with coronary angiogram N/A 07/13/2013    Procedure: LEFT HEART CATHETERIZATION WITH CORONARY ANGIOGRAM;  Surgeon: Lorretta Harp, MD;  Location: [facility] CATH LAB;  Service: Cardiovascular;  Laterality: N/A;  . Colonoscopy N/A 12/11/2014    Dr. Gala Romney via colostomy. Normal. Repeat in 2021.   Marland Kitchen Esophagogastroduodenoscopy N/A 12/11/2014    WEX:HBZJIR EGD  . Cardiac defibrillator placement  2009    Upgraded to a BiV ICD  . Right heart catheterization N/A 02/24/2015    Procedure: RIGHT HEART CATH;  Surgeon: Jolaine Artist, MD;  Location: [facility] CATH LAB;  Service: Cardiovascular;  Laterality: N/A;  . Ep implantable device N/A 04/10/2015    Procedure: Ppm/Biv Ppm Generator Changeout;  Surgeon: Evans Lance, MD;  Location: [facility] INVASIVE CV LAB CUPID;  Service: Cardiovascular;  Laterality: N/A;  . Bi-ventricular implantable cardioverter defibrillator  (crt-d)  2009  . Cardiac catheterization  08/2001; 2009    ; /notes 07/10/2013  . Coronary angioplasty with stent placement  2001; ~ 2006    "1 + 1"   . Givens capsule study N/A 07/23/2015    Procedure: GIVENS CAPSULE  STUDY;  Surgeon: Wilford Corner, MD;  Location: [place];  Service: Endoscopy;  Laterality: N/A;  . Colonoscopy N/A 08/24/2015    Procedure: COLONOSCOPY;  Surgeon: Manus Gunning, MD;  Location: [place];  Service: Gastroenterology;  Laterality: N/A;     Family History: Family History  Problem Relation Age of Onset  . Colon cancer Mother 34  . Colon cancer Sister 64  . Coronary artery disease Father   . Colon cancer Other     2 cousins, succumbed to illness      Social History: Social History   Social History  . Marital Status: Married    Spouse Name: N/A  . Number of Children: 1  . Years of Education: N/A   Occupational History  .     Social History Main Topics  . Smoking status: Never Smoker   . Smokeless tobacco: Never Used  . Alcohol Use: No     Comment: Former user 45 years ago  . Drug Use: No  . Sexual Activity: No   Other Topics Concern  . Not on file   Social History Narrative   Married 32 years   1 son, 2 grandkids    Denies alcohol, drugs, tobacco      Review of Systems: As per HPI  Vital Signs: Blood pressure 84/63, pulse 77, temperature 97.7 F (36.5 C), temperature source Oral, resp. rate 16, height 5\' 10"  (1.778 m), weight 207 lb (93.895 kg), SpO2 98 %.  Weight trends: Filed Weights   08/23/15 0145 08/23/15 1531  Weight: 207 lb (93.895 kg) 207 lb (93.895 kg)    Physical Exam: General: sitting up in chair, NAD, no asterixis present HEENT: [place]/AT, EOMI, sclera anicteric, mucus membranes moist CV: RRR, no m/g/r Pulm: crackles at the bases, L greater than R. Breaths non-labored. Abd: BS+, soft, obese, non-tender Ext: 3+ pitting edema in the lower extremities up to the mid-thighs Neuro: alert and oriented x 3   Lab results: Basic Metabolic Panel:  Recent Labs Lab 08/23/15 0215 08/24/15 0336 08/25/15 1410  NA 140 139 130*  K 4.1 4.2 5.0  CL 106 108 100*  CO2  --  14* 17*  GLUCOSE 127* 116* 148*  BUN 48* 45* 60*  CREATININE 4.00* 3.53* 4.17*  CALCIUM  --  9.4 9.2    CBC:  Recent Labs Lab 08/23/15 0230 08/23/15 0556 08/23/15 1356 08/24/15 0336  WBC 7.1 7.1 5.4 7.4  NEUTROABS 4.9  --  3.8  --   HGB 10.6* 9.0* 10.7* 12.4*  HCT 33.0* 27.9* 32.5* 38.1*  MCV 100.3* 99.6 97.0 99.7  PLT 151 129* 126* 146*   CBG:  Recent Labs Lab 08/24/15 2228 08/24/15 2341 08/25/15 0400 08/25/15 0728 08/25/15 1137  GLUCAP 169* 146* 58* 68 129*    Microbiology: Results for orders placed  or performed during the hospital encounter of 08/02/15  Urine culture     Status: None   Collection Time: 08/03/15  9:45 AM  Result Value Ref Range Status   Specimen Description URINE, CLEAN CATCH  Final   Special Requests NONE  Final   Culture NO GROWTH 1 DAY  Final   Report Status 08/04/2015 FINAL  Final    Coagulation Studies:  Recent Labs  08/23/15 0230  LABPROT 21.0*  INR 1.82*    Urinalysis:  Recent Labs  08/23/15 0322  COLORURINE YELLOW  LABSPEC 1.015  PHURINE 5.0  GLUCOSEU NEGATIVE  HGBUR NEGATIVE  BILIRUBINUR NEGATIVE  KETONESUR NEGATIVE  PROTEINUR 30*  UROBILINOGEN 1.0  NITRITE NEGATIVE  LEUKOCYTESUR NEGATIVE     Assessment & Plan: [name] is a 59 y.o. yo man with PMHx of chronic systolic HF (EF 93%), ischemic cardiomyopathy with AICD, paroxysmal AFib on Eliquis, adenocarcinoma of rectum s/p colostomy, type 2 DM, and CKD Stage IV who was admitted for evaluation of painless bleeding from his colostomy site which has resolved. He is now with likely progression of his chronic kidney disease.   Likely Progression of Chronic Kidney Disease: Patient with hx of CKD Stage IV with baseline Cr steadily progressing from high 2.0's to 3.0's and now in 4.0 range. He has been previously told by his nephrologist that he will likely need dialysis by the end of the year. It is unclear whether his symptoms of nausea/SOB are related to his recent colonoscopy procedure and his heart failure vs worsening of his CKD. He is significantly volume overloaded on exam with 3+ pitting edema in his lower extremities and crackles at the bases of the lungs. Additionally his weight is up 7 lbs. He has not received his home dose of daily Lasix for a few days, only 1 dose of IV Lasix 40 mg today. He needs further diuresis. Will increase Lasix to 80 mg IV BID to get him more euvolemic. Possible that the extra Lasix may push him over edge for dialysis, but he has no acute HD needs currently. It would  likely be beneficial during this hospital admission for vein mapping to be performed and for vascular surgery to evaluate him for fistula placement. Will also check PTH to evaluate for secondary hyperparathyroidism. Last PTH 41 in January 2016.   Metabolic Acidosis: Bicarb 14 yesterday and 17 today. Likely secondary to worsening CKD. Will start sodium bicarb PO 650 mg TID.   Hyponatremia: Na 130 today. Likely due to his renal failure/heart failure. Will likely improve with increased Lasix.   Normocytic Anemia: Hgb 10.6 on admission then dropped acutely to 9.0 within a few hours due to bleeding from his colostomy site. He has received 1 unit PRBCs. Hgb stable at 12.4. His baseline appears to be in 11-12 range. Will check iron studies and see if patient needs feraheme. Not a candidate for aranesp at this time.     Thank you for this interesting consult. Attending note to follow.    [name], MD, MPH Internal Medicine Resident, PGY-II Pager: [phone]

## 2015-08-25 NOTE — Progress Notes (Signed)
Pt refused cpap tonight.  Pt states he has tried our machine twice in the past and was unable to wear it.  Pt was advised that RT is available all night should he change his mind.

## 2015-08-25 NOTE — Progress Notes (Signed)
Dr. Moshe Cipro (Nephrology) paged about BP (92/63).  Pt due for 80mg  Lasix IV.  Okay to give med per MD.  Will continue to monitor.

## 2015-08-25 NOTE — Progress Notes (Signed)
Pt was bladder scanned. Pt had around 200-250 ml in bladder. Asked pt if he wanted to do a straight cath again and pt declined. Pt said that he didn't feel any pressure and would wait for the AM doctors to round. Will continue to monitor.  Albertina Senegal E

## 2015-08-25 NOTE — Progress Notes (Signed)
PROGRESS NOTE  [name] RDE:[id number] DOB: [birth year] DOA: 08/23/2015 PCP: Glo Herring., MD  Assessment/Plan: Bleeding from colostomy stoma/obstipation -[place] [facility] consulted-- s/p colonscopy-- bleeding stopped, seen by surgery as well- nothing further to add -resume eliquis -resume diet   Chronic hypotension -Baseline blood pressure around 80/60 -Current blood pressure stable   Anemia of chronic disease/acute blood loss anemia -stable post transfusion   Ischemic cardiomyopathy-EF 35% 6/31/49/ Chronic systolic heart failure -swelling today -lasix IV x 1    Paroxysmal atrial fibrillation/Long term current use of anticoagulant therapy -eliquis resumed   CAD with LCX disease and stents to LAD -Currently asymptomatic and no evidence of chest pain or dyspnea on exertion   Chronic kidney disease, stage IV (severe) -BUN slightly elevated and likely related to current bleeding -Creatinine stable and near baseline -Follow labs   DM type 2, uncontrolled, with renal complications -Check CBGs while NPO provide SSI -Need to clarify Toujeo dosage but for now have ordered 20 units daily   Chronically elevated transaminase level trend   GERD  -Continue PPI   OSA on CPAP -Provide nocturnal CPAP   Hyperlipidemia -Crestor on hold   H/O ventricular fibrillation-March 2016/BiV ICD (St Jude) gen change 04/10/15 -No longer appears to be on amiodarone based on medication reconciliation sheet  Code Status: full Family Communication: patient/family Disposition Plan: home in AM?   Consultants:  GI  surgery  Procedures:      HPI/Subjective: Nausea this AM  Objective: Filed Vitals:   08/25/15 0856  BP: 103/67  Pulse: 73  Temp:   Resp:     Intake/Output Summary (Last 24 hours) at 08/25/15 0921 Last data filed at 08/25/15 0800  Gross per 24 hour  Intake    363 ml  Output    575 ml  Net   -212 ml   Filed Weights   08/23/15  0145 08/23/15 1531  Weight: 93.895 kg (207 lb) 93.895 kg (207 lb)    Exam:   General:  Awake, NAD  Cardiovascular: rrr  Respiratory: clear  Abdomen: +BS, soft  Musculoskeletal: +edema LE  Data Reviewed: Basic Metabolic Panel:  Recent Labs Lab 08/23/15 0215 08/24/15 0336  NA 140 139  K 4.1 4.2  CL 106 108  CO2  --  14*  GLUCOSE 127* 116*  BUN 48* 45*  CREATININE 4.00* 3.53*  CALCIUM  --  9.4   Liver Function Tests: No results for input(s): AST, ALT, ALKPHOS, BILITOT, PROT, ALBUMIN in the last 168 hours. No results for input(s): LIPASE, AMYLASE in the last 168 hours. No results for input(s): AMMONIA in the last 168 hours. CBC:  Recent Labs Lab 08/23/15 0215 08/23/15 0230 08/23/15 0556 08/23/15 1356 08/24/15 0336  WBC  --  7.1 7.1 5.4 7.4  NEUTROABS  --  4.9  --  3.8  --   HGB 12.2* 10.6* 9.0* 10.7* 12.4*  HCT 36.0* 33.0* 27.9* 32.5* 38.1*  MCV  --  100.3* 99.6 97.0 99.7  PLT  --  151 129* 126* 146*   Cardiac Enzymes: No results for input(s): CKTOTAL, CKMB, CKMBINDEX, TROPONINI in the last 168 hours. BNP (last 3 results)  Recent Labs  04/23/15 0503 07/21/15 0947 08/02/15 2129  BNP 1218.9* 781.6* 1004.6*    ProBNP (last 3 results)  Recent Labs  09/27/14 0523 11/19/14 0745  PROBNP 9164.0* 5233.0*    CBG:  Recent Labs Lab 08/24/15 1603 08/24/15 2228 08/24/15 2341 08/25/15 0400 08/25/15 0728  GLUCAP 178* 169* 146* 122* 96  No results found for this or any previous visit (from the past 240 hour(s)).   Studies: No results found.  Scheduled Meds: . apixaban  5 mg Oral BID  . insulin aspart  0-9 Units Subcutaneous 6 times per day  . insulin glargine  20 Units Subcutaneous Q24H  . metoprolol succinate  12.5 mg Oral BID  . pantoprazole  40 mg Oral Daily  . polyethylene glycol  17 g Oral BID  . sodium chloride  3 mL Intravenous Q12H   Continuous Infusions:  Antibiotics Given (last 72 hours)    None      Principal Problem:    Bleeding from colostomy stoma Active Problems:   Ischemic cardiomyopathy-EF 35% 04/24/15 echo   Long term current use of anticoagulant therapy   CAD with LCX disease and stents to LAD   Chronic systolic heart failure   Paroxysmal atrial fibrillation   Chronic hypotension   Chronic kidney disease, stage IV (severe)   DM type 2, uncontrolled, with renal complications   GERD (gastroesophageal reflux disease)   OSA on CPAP   Hyperlipidemia   H/O ventricular fibrillation-March 2016   BiV ICD (St Jude) gen change 04/10/15   Chronically elevated transaminase level-Amiodarone resumed 04/24/15   Constipation   Anemia of chronic disease    Time spent: 25 min    [name], [name]  [facility] Pager [phone]. If 7PM-7AM, please contact night-coverage at [email or web address], password [email or web address] 08/25/2015, 9:21 AM  LOS: 2 days

## 2015-08-26 LAB — RENAL FUNCTION PANEL
ALBUMIN: 2.7 g/dL — AB (ref 3.5–5.0)
ANION GAP: 10 (ref 5–15)
BUN: 62 mg/dL — ABNORMAL HIGH (ref 6–20)
CALCIUM: 9.1 mg/dL (ref 8.9–10.3)
CO2: 23 mmol/L (ref 22–32)
Chloride: 102 mmol/L (ref 101–111)
Creatinine, Ser: 4.06 mg/dL — ABNORMAL HIGH (ref 0.61–1.24)
GFR, EST AFRICAN AMERICAN: 17 mL/min — AB (ref >60)
GFR, EST NON AFRICAN AMERICAN: 15 mL/min — AB (ref >60)
Glucose, Bld: 98 mg/dL (ref 65–99)
PHOSPHORUS: 5 mg/dL — AB (ref 2.5–4.6)
POTASSIUM: 4.5 mmol/L (ref 3.5–5.1)
SODIUM: 135 mmol/L (ref 135–145)

## 2015-08-26 LAB — GLUCOSE, CAPILLARY
GLUCOSE-CAPILLARY: 114 mg/dL — AB (ref 65–99)
GLUCOSE-CAPILLARY: 95 mg/dL (ref 65–99)
GLUCOSE-CAPILLARY: 95 mg/dL (ref 65–99)
Glucose-Capillary: 88 mg/dL (ref 65–99)
Glucose-Capillary: 78 mg/dL (ref 65–99)

## 2015-08-26 LAB — FOLATE: Folate: 17.7 ng/mL (ref >5.9)

## 2015-08-26 LAB — RETICULOCYTES
RBC.: 3.03 MIL/uL — ABNORMAL LOW (ref 4.22–5.81)
RETIC CT PCT: 3.4 % — AB (ref 0.4–3.1)
Retic Count, Absolute: 103 10*3/uL (ref 19.0–186.0)

## 2015-08-26 LAB — IRON AND TIBC
IRON: 123 ug/dL (ref 45–182)
SATURATION RATIOS: 44 % — AB (ref 17.9–39.5)
TIBC: 280 ug/dL (ref 250–450)
UIBC: 157 ug/dL

## 2015-08-26 LAB — FERRITIN: Ferritin: 1419 ng/mL — ABNORMAL HIGH (ref 24–336)

## 2015-08-26 LAB — VITAMIN B12: VITAMIN B 12: 3271 pg/mL — AB (ref 180–914)

## 2015-08-26 MED ORDER — INSULIN ASPART 100 UNIT/ML ~~LOC~~ SOLN
0.0000 [IU] | Freq: Every day | SUBCUTANEOUS | Status: DC
  Administered 2015-08-27 – 2015-08-29 (×2): 2 [IU] via SUBCUTANEOUS

## 2015-08-26 MED ORDER — INSULIN ASPART 100 UNIT/ML ~~LOC~~ SOLN
0.0000 [IU] | Freq: Three times a day (TID) | SUBCUTANEOUS | Status: DC
  Administered 2015-08-27: 2 [IU] via SUBCUTANEOUS
  Administered 2015-08-29: 3 [IU] via SUBCUTANEOUS

## 2015-08-26 MED ORDER — FUROSEMIDE 10 MG/ML IJ SOLN
120.0000 mg | Freq: Two times a day (BID) | INTRAVENOUS | Status: DC
  Administered 2015-08-26 – 2015-08-30 (×8): 120 mg via INTRAVENOUS
  Filled 2015-08-26 (×9): qty 12

## 2015-08-26 NOTE — Progress Notes (Signed)
Patient is refusing CPAP for the night. RT will continue to monitor. 

## 2015-08-26 NOTE — Care Management Note (Addendum)
Case Management Note  Patient Details  Name: NALU TROUBLEFIELD MRN: [id number] Date of Birth: 1956-11-02  Subjective/Objective:    Pt admitted for Bleeding from colostomy stoma/obstipation s/p colonscopy-- bleeding stopped, seen by surgery. Pt with Anemia of chronic disease/acute blood loss anemia-stable post transfusion. Chronic kidney disease, stage IV (severe)-BUN slightly elevated and likely related to current bleeding- per MD notes. Nephrology following.  Pt is from home with family support.          Action/Plan: CM will continue to monitor for disposition needs.   Expected Discharge Date:                  Expected Discharge Plan:  Home/Self Care  In-House Referral:     Discharge planning Services  CM Consult  Post Acute Care Choice:    Choice offered to:     DME Arranged:    DME Agency:     HH Arranged:    HH Agency:     Status of Service:  In process, will continue to follow  Medicare Important Message Given:  Yes-second notification given Date Medicare IM Given:    Medicare IM give by:    Date Additional Medicare IM Given:    Additional Medicare Important Message give by:     If discussed at [place] of Stay Meetings, dates discussed:    Additional Comments: 08-27-15 1213 Jacqlyn Krauss, RN,BSN Pt is currently active with [facility]. [facility] is following for HD access. CM will continue to monitor for disposition needs.    Bethena Roys, RN 08/26/2015, 3:37 PM

## 2015-08-26 NOTE — Progress Notes (Signed)
PROGRESS NOTE  [name] VWU:[id number] DOB: 1956-09-19 DOA: 08/23/2015 PCP: Robert Herring., MD  Assessment/Plan: Bleeding from colostomy stoma/obstipation -[facility] consulted-- s/p colonscopy-- bleeding stopped, seen by surgery as well- nothing further to add -resume eliquis -resume diet   Chronic hypotension -Baseline blood pressure around 80/60 -Current blood pressure stable   Anemia of chronic disease/acute blood loss anemia -stable post transfusion   Ischemic cardiomyopathy-EF 35% 2/95/62/ Chronic systolic heart failure -lasix per renal   Paroxysmal atrial fibrillation/Long term current use of anticoagulant therapy -eliquis resumed   CAD with LCX disease and stents to LAD -Currently asymptomatic and no evidence of chest pain or dyspnea on exertion   Chronic kidney disease, stage IV (severe) -BUN slightly elevated and likely related to current bleeding? nephro following -asked cards to comment on AVF -? If need to call vascular for vein mapping -weight up   DM type 2, uncontrolled, with renal complications - SSI -Need to clarify Toujeo dosage but for now have ordered 20 units daily   Chronically elevated transaminase level trend   GERD  -Continue PPI   OSA on CPAP -Provide nocturnal CPAP   Hyperlipidemia -Crestor on hold   H/O ventricular fibrillation-March 2016/BiV ICD (St Jude) gen change 04/10/15 -No longer appears to be on amiodarone based on medication reconciliation sheet  Code Status: full Family Communication: patient/family Disposition Plan: home when stable   Consultants:  GI  Surgery  neprho  Procedures:      HPI/Subjective: Nausea and swelling improved this AM Overall feeling better  Objective: Filed Vitals:   08/26/15 0841  BP: 83/61  Pulse:   Temp:   Resp:     Intake/Output Summary (Last 24 hours) at 08/26/15 1042 Last data filed at 08/26/15 0839  Gross per 24 hour  Intake     840 ml  Output   1025 ml  Net   -185 ml   Filed Weights   08/23/15 0145 08/23/15 1531 08/26/15 0513  Weight: 93.895 kg (207 lb) 93.895 kg (207 lb) 100.472 kg (221 lb 8 oz)    Exam:   General:  Awake, NAD  Cardiovascular: rrr  Respiratory: clear  Abdomen: +BS, soft  Musculoskeletal: +edema LE  Data Reviewed: Basic Metabolic Panel:  Recent Labs Lab 08/23/15 0215 08/24/15 0336 08/25/15 1410 08/26/15 0428  NA 140 139 130* 135  K 4.1 4.2 5.0 4.5  CL 106 108 100* 102  CO2  --  14* 17* 23  GLUCOSE 127* 116* 148* 98  BUN 48* 45* 60* 62*  CREATININE 4.00* 3.53* 4.17* 4.06*  CALCIUM  --  9.4 9.2 9.1  PHOS  --   --   --  5.0*   Liver Function Tests:  Recent Labs Lab 08/26/15 0428  ALBUMIN 2.7*   No results for input(s): LIPASE, AMYLASE in the last 168 hours. No results for input(s): AMMONIA in the last 168 hours. CBC:  Recent Labs Lab 08/23/15 0215 08/23/15 0230 08/23/15 0556 08/23/15 1356 08/24/15 0336  WBC  --  7.1 7.1 5.4 7.4  NEUTROABS  --  4.9  --  3.8  --   HGB 12.2* 10.6* 9.0* 10.7* 12.4*  HCT 36.0* 33.0* 27.9* 32.5* 38.1*  MCV  --  100.3* 99.6 97.0 99.7  PLT  --  151 129* 126* 146*   Cardiac Enzymes: No results for input(s): CKTOTAL, CKMB, CKMBINDEX, TROPONINI in the last 168 hours. BNP (last 3 results)  Recent Labs  04/23/15 0503 07/21/15 0947 08/02/15 2129  BNP 1218.9* 781.6*  1004.6*    ProBNP (last 3 results)  Recent Labs  09/27/14 0523 11/19/14 0745  PROBNP 9164.0* 5233.0*    CBG:  Recent Labs Lab 08/25/15 1559 08/25/15 2041 08/26/15 08/26/15 0357 08/26/15 0731  GLUCAP 121* 131* 114* 95 88    No results found for this or any previous visit (from the past 240 hour(s)).   Studies: No results found.  Scheduled Meds: . apixaban  5 mg Oral BID  . furosemide  80 mg Intravenous BID  . insulin aspart  0-9 Units Subcutaneous 6 times per day  . insulin glargine  20 Units Subcutaneous Q24H  . metoprolol succinate  12.5  mg Oral BID  . pantoprazole  40 mg Oral Daily  . polyethylene glycol  17 g Oral BID  . sodium bicarbonate  650 mg Oral TID  . sodium chloride  3 mL Intravenous Q12H   Continuous Infusions:  Antibiotics Given (last 72 hours)    None      Principal Problem:   Bleeding from colostomy stoma Active Problems:   Ischemic cardiomyopathy-EF 35% 04/24/15 echo   Long term current use of anticoagulant therapy   CAD with LCX disease and stents to LAD   Chronic systolic heart failure   Paroxysmal atrial fibrillation   Chronic hypotension   Chronic kidney disease, stage IV (severe)   DM type 2, uncontrolled, with renal complications   GERD (gastroesophageal reflux disease)   OSA on CPAP   Hyperlipidemia   H/O ventricular fibrillation-March 2016   BiV ICD (St Jude) gen change 04/10/15   Chronically elevated transaminase level-Amiodarone resumed 04/24/15   Constipation   Anemia of chronic disease    Time spent: 25 min    [name], Robert  [facility] Pager [phone]. If 7PM-7AM, please contact night-coverage at [email or web address], password [facility] 08/26/2015, 10:42 AM  LOS: 3 days

## 2015-08-26 NOTE — Consult Note (Signed)
[facility] NOTE   MRN : [id number]  Reason for Consult: ESRD   Referring Physician: Dr. Lorrene Reid  History of Present Illness: 59 y/o male with CKD stage IV.  We are being consulted for permanent dialysis access.   According to Dr. Sanda Klein note He does not have any acute needs for dialysis currently but would benefit from evaluation by vascular surgery for fistula placement. Admitted for bleeding ostomy site.  His medical history includes: hypertension, CAD, Chronic systolic heart failure, paroxysmal atrial fibrillation on eliquis, adenocarcinoma of the the rectum, s/p colostomy, ischemic cardiomyopathy, type 2 DM, and TIA.      Current Facility-Administered Medications  Medication Dose Route Frequency Provider Last Rate Last Dose  . acetaminophen (TYLENOL) tablet 650 mg  650 mg Oral Q6H PRN Samella Parr, NP       Or  . acetaminophen (TYLENOL) suppository 650 mg  650 mg Rectal Q6H PRN Samella Parr, NP      . albuterol (PROVENTIL) (2.5 MG/3ML) 0.083% nebulizer solution 2.5 mg  2.5 mg Nebulization Q6H PRN Hosie Poisson, MD      . apixaban (ELIQUIS) tablet 5 mg  5 mg Oral BID Geradine Girt, DO   5 mg at 08/26/15 0857  . benzonatate (TESSALON) capsule 100 mg  100 mg Oral TID PRN Geradine Girt, DO   100 mg at 08/25/15 1824  . fluticasone (FLONASE) 50 MCG/ACT nasal spray 1 spray  1 spray Each Nare Daily PRN Samella Parr, NP      . furosemide (LASIX) 120 mg in dextrose 5 % 50 mL IVPB  120 mg Intravenous BID Carly J Rivet, MD      . insulin aspart (novoLOG) injection 0-15 Units  0-15 Units Subcutaneous TID WC [name] U [name], DO      . insulin aspart (novoLOG) injection 0-5 Units  0-5 Units Subcutaneous QHS [name] U [name], DO      . insulin glargine (LANTUS) injection 20 Units  20 Units Subcutaneous Q24H Hosie Poisson, MD   20 Units at 08/25/15 1240  . metoprolol succinate (TOPROL-XL) 24 hr tablet 12.5 mg  12.5 mg Oral BID Geradine Girt, DO   12.5 mg at  08/26/15 1027  . ondansetron (ZOFRAN) tablet 4 mg  4 mg Oral Q6H PRN Samella Parr, NP       Or  . ondansetron [facility]) injection 4 mg  4 mg Intravenous Q6H PRN Samella Parr, NP   4 mg at 08/24/15 1749  . pantoprazole (PROTONIX) EC tablet 40 mg  40 mg Oral Daily Geradine Girt, DO   40 mg at 08/26/15 0857  . polyethylene glycol (MIRALAX / GLYCOLAX) packet 17 g  17 g Oral BID Samella Parr, NP   17 g at 08/23/15 1144  . sodium bicarbonate tablet 650 mg  650 mg Oral TID Juliet Rude, MD   650 mg at 08/26/15 0857  . sodium chloride 0.9 % injection 3 mL  3 mL Intravenous Q12H Samella Parr, NP   3 mL at 08/26/15 0858    Pt meds include: Statin :Yes Betablocker: Yes ASA: No Other anticoagulants/antiplatelets: Eliquis  Past Medical History  Diagnosis Date  . Essential hypertension, benign   . CAD (coronary artery disease)     a. BMS to LAD 2001 at [facility] b. PTCA/atherectomy ramus and BMS to LAD 2009  . Chronic systolic heart failure   . GERD (gastroesophageal reflux disease)   .  Paroxysmal atrial fibrillation     a. on amiodarone, digoxin and Eliquis  . Adenocarcinoma of rectum     a. 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 Prostate cancer     a. s/p seed implants with chemo and radiation  . TIA (transient ischemic attack)   . OSA on CPAP   . HLD (hyperlipidemia)   . Orthostatic hypotension   . Dizziness     a. chronic. Admission for this 07/18/2014  . History of falling July 2015    due to dizziness from medications  . Ischemic cardiomyopathy     EF 18% by nuclear study 2016, multiple myocardial infarctions in past    . Chronic kidney disease, stage IV (severe) 12/11/2013  . DM type 2, uncontrolled, with renal complications 05/31/9484  . Obesity 12/12/2013  . Hematuria 03/2015  . HCAP (healthcare-associated pneumonia) 07/21/2015  . AICD (automatic cardioverter/defibrillator) present 2009    BiVentricular (upgrade)  . 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 History of  blood transfusion     "I've had 2 units so far this year" (07/21/2015)  . Anemia     Past Surgical History  Procedure Laterality Date  . Internal defibrillator and pacemaker  2002  . Abdominal and perineal resection of rectum with total mesorectal excision  10/04/2007  . Colonoscopy  09/14/2011    Dr. Gala Romney: via colostomy, Single pedunculated benign inflammatory polyp. Due for surveillance Oct 2015  . Colostomy  2008  . Colonoscopy N/A 07/02/2014    Procedure: COLONOSCOPY;  Surgeon: Daneil Dolin, MD;  [facility];  Service: Endoscopy;  Laterality: N/A;  7:30 / COLONOSCOPY THRU COLOSTOMY  . Esophagogastroduodenoscopy N/A 07/02/2014    Procedure: ESOPHAGOGASTRODUODENOSCOPY (EGD);  Surgeon: Daneil Dolin, MD;  [facility];  Service: Endoscopy;  Laterality: N/A;  7:30  . Savory dilation N/A 07/02/2014    Procedure: SAVORY DILATION;  Surgeon: Daneil Dolin, MD;  [facility];  Service: Endoscopy;  Laterality: N/A;  7:30  . Maloney dilation N/A 07/02/2014    Procedure: Venia Minks DILATION;  Surgeon: Daneil Dolin, MD;  [facility];  Service: Endoscopy;  Laterality: N/A;  7:30  . Portacath placement  06/2007    "removed ~ 1 yr later"  . Left heart catheterization with coronary angiogram N/A 07/13/2013    Procedure: LEFT HEART CATHETERIZATION WITH CORONARY ANGIOGRAM;  Surgeon: Lorretta Harp, MD;  Location: [facility] CATH LAB;  Service: Cardiovascular;  Laterality: N/A;  . Colonoscopy N/A 12/11/2014    Dr. Gala Romney via colostomy. Normal. Repeat in 2021.   Marland Kitchen Esophagogastroduodenoscopy N/A 12/11/2014    IOE:VOJJKK EGD  . Cardiac defibrillator placement  2009    Upgraded to a BiV ICD  . Right heart catheterization N/A 02/24/2015    Procedure: RIGHT HEART CATH;  Surgeon: Jolaine Artist, MD;  Location: [facility] CATH LAB;  Service: Cardiovascular;  Laterality: N/A;  . Ep implantable device N/A 04/10/2015    Procedure: Ppm/Biv Ppm Generator Changeout;  Surgeon: Evans Lance, MD;  Location: [facility] INVASIVE CV LAB CUPID;  Service: Cardiovascular;  Laterality: N/A;  . Bi-ventricular implantable cardioverter defibrillator  (crt-d)  2009  . Cardiac catheterization  08/2001; 2009    ; /notes 07/10/2013  . Coronary angioplasty with stent placement  2001; ~ 2006    "1 + 1"   . Givens capsule study N/A 07/23/2015    Procedure: GIVENS CAPSULE STUDY;  Surgeon: Wilford Corner, MD;  Location: [place];  Service: Endoscopy;  Laterality: N/A;  . Colonoscopy N/A 08/24/2015    Procedure: COLONOSCOPY;  Surgeon: Manus Gunning, MD;  Location: [place];  Service: Gastroenterology;  Laterality: N/A;    Social History Social History  Substance Use Topics  . Smoking status: Never Smoker   . Smokeless tobacco: Never Used  . Alcohol Use: No     Comment: Former user 45 years ago    Family History Family History  Problem Relation Age of Onset  . Colon cancer Mother 60  . Colon cancer Sister 94  . Coronary artery disease Father   . Colon cancer Other     2 cousins, succumbed to illness    No Known Allergies   REVIEW OF SYSTEMS  General: [ ]  Weight loss, [ ]  Fever, [ ]  chills Neurologic: [ ]  Dizziness, [ ]  Blackouts, [ ]  Seizure [ ]  Stroke, [ ]  "Mini stroke", [ ]  Slurred speech, [ ]  Temporary blindness; [ ]  weakness in arms or legs, [ ]  Hoarseness [ ]  Dysphagia Cardiac: [ ]  Chest pain/pressure, [ ]  Shortness of breath at rest [ x] Shortness of breath with exertion, [ ]  Atrial fibrillation or irregular heartbeat  Vascular: [ ]  Pain in legs with walking, [ ]  Pain in legs at rest, [ ]  Pain in legs at night,  [ ]  Non-healing ulcer, [ ]  Blood clot in vein/DVT,   Pulmonary: [ ]  Home oxygen, [ ]  Productive cough, [ ]  Coughing up blood, [ ]  Asthma,  [ ]  Wheezing [ ]  COPD Musculoskeletal:  [ ]  Arthritis, [ ]  Low back pain, [ ]  Joint pain Hematologic: [ ]  Easy Bruising, [x ] Anemia; [ ]  Hepatitis Gastrointestinal: [x ] Blood in stool, [ ]  Gastroesophageal  Reflux/heartburn, Urinary: [ x] chronic Kidney disease, [ ]  on HD - [ ]  MWF or [ ]  TTHS, [ ]  Burning with urination, [ ]  Difficulty urinating Skin: [ ]  Rashes, [ ]  Wounds Psychological: [ ]  Anxiety, [ ]  Depression  Physical Examination Filed Vitals:   08/25/15 2035 08/26/15 0513 08/26/15 0838 08/26/15 0841  BP: 95/65 82/57 86/60  83/61  Pulse: 103 106    Temp: 97.8 F (36.6 C) 98.4 F (36.9 C)    TempSrc: Oral Oral    Resp: 15 15    Height:      Weight:  221 lb 8 oz (100.472 kg)    SpO2: 99% 94%     Body mass index is 31.78 kg/(m^2).  General:  WDWN in NAD Gait: Normal HENT: WNL Pulmonary: normal non-labored breathing, left chest AICD  Cardiac: RRR Abdomen: soft, NT Skin: no rashes, ulcers noted;  no Gangrene , no cellulitis; no open wounds;   Vascular Exam/Pulses:absent right radial pulse 1+ left radial pulse 2+ brachial pulse bilaterally Musculoskeletal: no muscle wasting or atrophy; no edema  Neurologic: A&O X 3; Appropriate Affect ;  SENSATION: normal; MOTOR FUNCTION: 5/5 Symmetric Speech is fluent/normal   Significant Diagnostic Studies: CBC Lab Results  Component Value Date   WBC 7.4 08/24/2015   HGB 12.4* 08/24/2015   HCT 38.1* 08/24/2015   MCV 99.7 08/24/2015   PLT 146* 08/24/2015    BMET    Component Value Date/Time   NA 135 08/26/2015 0428   K 4.5 08/26/2015 0428   CL 102 08/26/2015 0428   CO2 23 08/26/2015 0428   GLUCOSE 98 08/26/2015 0428   BUN 62* 08/26/2015 0428   CREATININE 4.06* 08/26/2015 0428   CREATININE 2.20* 04/08/2015 1526   CALCIUM 9.1 08/26/2015  0428   CALCIUM 9.1 12/26/2014 0950   GFRNONAA 15* 08/26/2015 0428   GFRAA 17* 08/26/2015 0428   Estimated Creatinine Clearance: 23 mL/min (by C-G formula based on Cr of 4.06).  COAG Lab Results  Component Value Date   INR 1.82* 08/23/2015   INR 1.19 04/08/2015   INR 1.53* 04/04/2015     Non-Invasive Vascular Imaging:  Pending vein mapping  ASSESSMENT/PLAN:  CKD stage IV.   Will follow up with pt post vein map.  Most likely will need right arm access with left side AICD but has no radial pulse on right so most likely upper arm  Ruta Hinds, MD [facility] [place] Office: [phone] Pager: [phone]

## 2015-08-26 NOTE — Care Management Important Message (Signed)
Important Message  Patient Details  Name: [name] MRN: [id number] Date of Birth: Apr 30, 1956   Medicare Important Message Given:  Yes-second notification given    [name] May 08/26/2015, 11:38 AM

## 2015-08-26 NOTE — Progress Notes (Signed)
[name] Progress Note    Assessment/ Plan:   1. Likely Progression of CKD: Cr stable in 4.0's, 4.06 today. He responded fairly well to Lasix 80 mg IV BID with UOP 900cc. Would like to see more volume off so will increase Lasix to 120 mg IV BID. He does not have any acute needs for dialysis currently but would benefit from evaluation by vascular surgery for fistula placement. Will order vein mapping of right upper extremity and vascular surgery should be consulted to at least have him on their radar and can follow up as outpatient for fistula placement. Would appreciate Cardiology weighing in on patient's surgical risk for fistula placement. Awaiting PTH level.  2. Metabolic Acidosis: Bicarb 23 today. Resolved after Lasix therapy.  3. Hyponatremia: Na 135, up from 130 yesterday. Improved after Lasix. Resolved.  4. Anemia of Chronic Disease: Iron studies showed iron 123, sat 44%, and ferritin 1419 most consistent with anemia of chronic disease. His Hgb is stable at 12.4 two days ago.   Subjective:   No acute events overnight. Patient denies any nausea or SOB. He feels some of the swelling in his legs has gone down. He reports good appetite.    Objective:   BP 83/61 mmHg  Pulse 106  Temp(Src) 98.4 F (36.9 C) (Oral)  Resp 15  Ht 5\' 10"  (1.778 m)  Wt 221 lb 8 oz (100.472 kg)  BMI 31.78 kg/m2  SpO2 94%  Intake/Output Summary (Last 24 hours) at 08/26/15 1201 Last data filed at 08/26/15 1140  Gross per 24 hour  Intake    840 ml  Output   1350 ml  Net   -510 ml   Weight change:   Physical Exam: Gen: alert, sitting up in bed, NAD CVS: RRR, no m/g/r Resp: CTA bilaterally, no crackles appreciated Abd: BS+, soft, non-tender Ext: 3+ pitting edema to the mid-thighs bilaterally  Imaging: No results found.  Labs: BMET  Recent Labs Lab 08/23/15 0215 08/24/15 0336 08/25/15 1410 08/26/15 0428  NA 140 139 130* 135  K 4.1 4.2 5.0 4.5  CL 106 108 100* 102  CO2   --  14* 17* 23  GLUCOSE 127* 116* 148* 98  BUN 48* 45* 60* 62*  CREATININE 4.00* 3.53* 4.17* 4.06*  CALCIUM  --  9.4 9.2 9.1  PHOS  --   --   --  5.0*   CBC  Recent Labs Lab 08/23/15 0230 08/23/15 0556 08/23/15 1356 08/24/15 0336  WBC 7.1 7.1 5.4 7.4  NEUTROABS 4.9  --  3.8  --   HGB 10.6* 9.0* 10.7* 12.4*  HCT 33.0* 27.9* 32.5* 38.1*  MCV 100.3* 99.6 97.0 99.7  PLT 151 129* 126* 146*    Medications:    . apixaban  5 mg Oral BID  . furosemide  80 mg Intravenous BID  . insulin aspart  0-9 Units Subcutaneous 6 times per day  . insulin glargine  20 Units Subcutaneous Q24H  . metoprolol succinate  12.5 mg Oral BID  . pantoprazole  40 mg Oral Daily  . polyethylene glycol  17 g Oral BID  . sodium bicarbonate  650 mg Oral TID  . sodium chloride  3 mL Intravenous Q12H      Robert Felling, MD, MPH IMTS- PGY-2  08/26/2015, 12:01 PM   I have seen and examined this patient and agree with plan and assessment of Dr. Arcelia [name].  Stable creatinine in the 4's and some but not great diuresis with 80 of  lasix BID - and quite a bit of edema. Will increase to 120 BID. Cardiology has cleared him for surgery once his CHF has improved. Will proceed with vein mapping of his right arm and consult [facility] for access evaluation.  Robert Maes B,MD 08/26/2015 12:36 PM

## 2015-08-26 NOTE — Progress Notes (Signed)
Subjective:  Denies CP or dyspnea   Objective:  Filed Vitals:   08/25/15 2035 08/26/15 0513 08/26/15 0838 08/26/15 0841  BP: 95/65 82/57 86/60  83/61  Pulse: 103 106    Temp: 97.8 F (36.6 C) 98.4 F (36.9 C)    TempSrc: Oral Oral    Resp: 15 15    Height:      Weight:  100.472 kg (221 lb 8 oz)    SpO2: 99% 94%      Intake/Output from previous day:  Intake/Output Summary (Last 24 hours) at 08/26/15 1115 Last data filed at 08/26/15 0839  Gross per 24 hour  Intake    840 ml  Output   1025 ml  Net   -185 ml    Physical Exam: Physical exam: Well-developed well-nourished in no acute distress.  Skin is warm and dry.  HEENT is normal.  Neck is supple.  Chest is clear to auscultation with normal expansion.  Cardiovascular exam is regular rate and rhythm.  Abdominal exam nontender; ascites noted; abdominal wall edema Extremities show 3+ edema. neuro grossly intact    Lab Results: Basic Metabolic Panel:  Recent Labs  08/25/15 1410 08/26/15 0428  NA 130* 135  K 5.0 4.5  CL 100* 102  CO2 17* 23  GLUCOSE 148* 98  BUN 60* 62*  CREATININE 4.17* 4.06*  CALCIUM 9.2 9.1  PHOS  --  5.0*   CBC:  Recent Labs  08/23/15 1356 08/24/15 0336  WBC 5.4 7.4  NEUTROABS 3.8  --   HGB 10.7* 12.4*  HCT 32.5* 38.1*  MCV 97.0 99.7  PLT 126* 146*     Assessment/Plan:  59 yo male with PMH coronary artery disease, ischemic cardiomyopathy, biventricular ICD, permanent atrial fibrillation, chronic kidney disease admitted with GI bleed. Colonoscopy revealed a diminutive polyp and active bleeding/irritation from the stoma. General surgery evaluated and felt anticoagulation can be resumed. Patient has developed significant volume excess. He is over 6 kg up in weight compared to admission. Nephrology evaluated and increased his diuretics. They also feel that he will need dialysis in the near future and will require AV fistula. We were asked to reassess preoperatively. Last  nuclear study following non-ST elevation myocardial infarction in May 2016 showed ejection fraction 20%; prior infarct with mild peri-infarct ischemia. Patient treated medically. Echocardiogram today 2016 showed ejection fraction 35%. 1 preoperative evaluation prior to AV fistula placement-the patient has not had worsening dyspnea on exertion or chest pain. He did have a recent nuclear study that showed infarct with mild peri-infarct ischemia. Given his multiple medical problems he will be higher risk for any procedure but I do not think further cardiac evaluation is indicated preoperatively. He does have significant volume excess and I think this needs to improve prior to proceeding to the operating room. 2 acute on chronic systolic congestive heart failure-patient is volume overloaded on examination. His diuretics were held at the time of admission and his weight has increased greater than 6 kg. His Lasix has been increased to 80 mg twice a day. We will continue with that dose and follow renal function. Patient may proceed to the operating room when his CHF improves. 3 chronic stage IV kidney disease-patient is nearing dialysis. Nephrology following. Follow renal function with diuresis. 4 permanent atrial fibrillation-continue present medications for rate control; follow on telemetry and increase as needed. Resume apixaban following fistula. 5 ischemic cardiomyopathy-blood pressure has been in the 80s.presently low 90s. Will continue metoprolol for rate control of  atrial fibrillation and diuretics. We will resume hydralazine/nitrates later if blood pressure allows.   [name] 08/26/2015, 11:15 AM

## 2015-08-26 NOTE — Progress Notes (Signed)
Paged Dr. Arcelia Jew about low BP's. Was advised to continue with the metoprolol and lasix. Will continue to monitor.

## 2015-08-27 ENCOUNTER — Other Ambulatory Visit (HOSPITAL_COMMUNITY): Payer: Medicare Other

## 2015-08-27 ENCOUNTER — Inpatient Hospital Stay (HOSPITAL_COMMUNITY): Payer: Medicare Other

## 2015-08-27 DIAGNOSIS — N184 Chronic kidney disease, stage 4 (severe): Secondary | ICD-10-CM

## 2015-08-27 DIAGNOSIS — I5023 Acute on chronic systolic (congestive) heart failure: Secondary | ICD-10-CM

## 2015-08-27 LAB — RENAL FUNCTION PANEL
ANION GAP: 10 (ref 5–15)
Albumin: 2.7 g/dL — ABNORMAL LOW (ref 3.5–5.0)
BUN: 57 mg/dL — ABNORMAL HIGH (ref 6–20)
CALCIUM: 8.5 mg/dL — AB (ref 8.9–10.3)
CHLORIDE: 101 mmol/L (ref 101–111)
CO2: 23 mmol/L (ref 22–32)
Creatinine, Ser: 3.57 mg/dL — ABNORMAL HIGH (ref 0.61–1.24)
GFR calc non Af Amer: 17 mL/min — ABNORMAL LOW (ref >60)
GFR, EST AFRICAN AMERICAN: 20 mL/min — AB (ref >60)
GLUCOSE: 78 mg/dL (ref 65–99)
POTASSIUM: 3 mmol/L — AB (ref 3.5–5.1)
Phosphorus: 3.8 mg/dL (ref 2.5–4.6)
SODIUM: 134 mmol/L — AB (ref 135–145)

## 2015-08-27 LAB — CBC
HEMATOCRIT: 30 % — AB (ref 39.0–52.0)
HEMOGLOBIN: 10.2 g/dL — AB (ref 13.0–17.0)
MCH: 33.2 pg (ref 26.0–34.0)
MCHC: 34 g/dL (ref 30.0–36.0)
MCV: 97.7 fL (ref 78.0–100.0)
PLATELETS: 114 10*3/uL — AB (ref 150–400)
RBC: 3.07 MIL/uL — AB (ref 4.22–5.81)
RDW: 15.4 % (ref 11.5–15.5)
WBC: 5.5 10*3/uL (ref 4.0–10.5)

## 2015-08-27 LAB — GLUCOSE, CAPILLARY
GLUCOSE-CAPILLARY: 111 mg/dL — AB (ref 65–99)
GLUCOSE-CAPILLARY: 230 mg/dL — AB (ref 65–99)
Glucose-Capillary: 142 mg/dL — ABNORMAL HIGH (ref 65–99)
Glucose-Capillary: 85 mg/dL (ref 65–99)
Glucose-Capillary: 130 mg/dL — ABNORMAL HIGH (ref 65–99)

## 2015-08-27 LAB — PTH, INTACT AND CALCIUM
CALCIUM TOTAL (PTH): 8.6 mg/dL (ref 8.6–10.2)
PTH: 65 pg/mL (ref 15–65)

## 2015-08-27 MED ORDER — ROSUVASTATIN CALCIUM 10 MG PO TABS
10.0000 mg | ORAL_TABLET | Freq: Every day | ORAL | Status: DC
  Administered 2015-08-27 – 2015-08-29 (×3): 10 mg via ORAL
  Filled 2015-08-27 (×3): qty 1

## 2015-08-27 MED ORDER — METOPROLOL SUCCINATE ER 25 MG PO TB24
12.5000 mg | ORAL_TABLET | Freq: Two times a day (BID) | ORAL | Status: DC
  Administered 2015-08-27 – 2015-08-29 (×3): 12.5 mg via ORAL
  Filled 2015-08-27 (×6): qty 1

## 2015-08-27 MED ORDER — POTASSIUM CHLORIDE CRYS ER 20 MEQ PO TBCR
40.0000 meq | EXTENDED_RELEASE_TABLET | Freq: Once | ORAL | Status: AC
Start: 2015-08-27 — End: 2015-08-27
  Administered 2015-08-27: 40 meq via ORAL
  Filled 2015-08-27: qty 2

## 2015-08-27 MED ORDER — POTASSIUM CHLORIDE CRYS ER 20 MEQ PO TBCR
40.0000 meq | EXTENDED_RELEASE_TABLET | Freq: Once | ORAL | Status: AC
  Administered 2015-08-27: 40 meq via ORAL
  Filled 2015-08-27: qty 2

## 2015-08-27 NOTE — Progress Notes (Signed)
   ELECTROPHYSIOLOGY DEVICE INTERROGATION    Device Manufacturer: STJ Model Number: Celso Amy  DOI: 04/10/2015 Implanting physician: Lovena Le  Device following physician: Thresa Ross Time: 9.3 seconds  Estimated Longevity: 5.8 years     RA Lead (1688) RV Lead (0148)  LV Lead (4196)  Amplitude 1.51mV 9.79mV   Impedence 380 650 390  Threshold     HV impedence  37    No ventricular arrhythmias; 87% mode switched  Programmed parameters:  Brady parameters:  Mode: DDDR Lower Rate: 70 Upper rate: 110 PAV: 170   SAV: 120  Tachy parameters:  VT1 zone: Rate: 150 Therapies: monitor only     VT2 zone: Rate: 187 Therapies: ATP, shock  VF zone:   Rate: 222 Therapies: ATP, shock  Changes this session: none  The patient has persistent atrial fibrillation and is 87% mode switched resulting in 48% BiV pacing.  Discussed with Dr Aundra Dubin, pt is unable to tolerate amio with hypotension would probably benefit from AVN ablation, but with possible HD need going forward, this will put him at increased risk for device system infection in the future. I will discuss with Dr Lovena Le.  Chanetta Marshall K,NP  08/27/2015 9:38 AM   EP Attending  I have reviewed data above and discussed with Chanetta Marshall, NP-EP. The patient has worsening heart failure and uncontrolled atrial fib and cannot be maintained in NSR. AV node ablation is our only option for control of ventricular rate and allow for BiV pacing. Will plan to proceed with this once patient is stable from a heart failure perspective.  Mikle Bosworth.D.

## 2015-08-27 NOTE — Discharge Instructions (Signed)

## 2015-08-27 NOTE — Progress Notes (Signed)
VASCULAR LAB PRELIMINARY  PRELIMINARY  PRELIMINARY  PRELIMINARY    Right Radial artery is patent with triphasic flow from origin to wrist.   Right  Upper Extremity Vein Map    Cephalic  Segment Diameter Depth Comment  1. Axilla 3.72 mm mm   2. Mid upper arm 3.98 mm mm branch  3. Above AC 4.61 mm mm   4. In AC 4.89 mm mm   5. Below AC 3.24 mm mm   6. Mid forearm 3.18 mm mm   7. Wrist 2.6 mm mm    mm mm    mm mm    mm mm    Basilic  Segment Diameter Depth Comment  1. Axilla 3.29 mm 16.3 mm   2. Mid upper arm 4.16 mm 10.5 mm   3. Above AC 2.56 mm mm   4. In AC mm mm   5. Below AC 2.01 mm mm   6. Mid forearm 2.61 mm mm   7. Wrist 2.78 mm mm    mm mm    mm mm    mm mm    Left Upper Extremity Vein Map    Cephalic  Segment Diameter Depth Comment  1. Axilla mm mm Nothing in upper arm  2. Mid upper arm mm mm   3. Above AC mm mm   4. In AC mm mm   5. Below AC 3.61 mm mm   6. Mid forearm 3.11 mm mm   7. Wrist 3.24 mm mm    mm mm    mm mm    mm mm    Basilic  Segment Diameter Depth Comment  1. Axilla mm mm   2. Mid upper arm 5.97 mm mm   3. Above AC 5.17 18.3 mm mm   4. In AC mm mm   5. Below AC mm mm   6. Mid forearm mm mm   7. Wrist mm mm    mm mm    mm mm    mm mm     Cestone,Helene, RVT 08/27/2015, 4:29 PM

## 2015-08-27 NOTE — Consult Note (Signed)
   [facility] CM Inpatient Consult   08/27/2015  KRUE PETERKA 12/28/1955 [id number] Patient is currently active with [facility] for chronic disease management services.  Patient has been engaged by a [name].  Our community based plan of care has focused on disease management and community resource support.  Patient will receive a post discharge transition of care call and will be evaluated for monthly home visits for assessments and disease process education.  Made Inpatient Case Manager aware that [place] [facility] following. Of note, [facility] Care Management services does not replace or interfere with any services that are arranged by inpatient case management or social work.  For additional questions or referrals please contact: Natividad Brood, RN BSN [facility] Liaison  [phone] business mobile phone

## 2015-08-27 NOTE — Progress Notes (Signed)
PROGRESS NOTE  [name] OHY:[id number] DOB: 1956/05/30 DOA: 08/23/2015 PCP: Glo Herring., MD   59 year old with h/o hypertension, CAD, Chronic systolic heart failure, paroxysmal atrial fibrillation on eliquis, adenocarcinoma of the the rectum, s/p colostomy, ischemic cardiomyopathy, type 2 DM, TIA, stage 4 CKD, admitted for bleeding in to the colostomy site since last night. Currently no blood in the colostomy bag.  Colonoscopy was done with source of bleeding found.  He was also found to have worsening renal failure secondary to volume overload.  Renal was consulted as well as cardiology re-consulted   Assessment/Plan: Bleeding from colostomy stoma/obstipation -[place] [facility] consulted-- s/p colonscopy-- bleeding stopped, seen by surgery as well- nothing further to add -resume eliquis -resume diet   Chronic hypotension -Baseline blood pressure around 80/60 -Current blood pressure stable   Anemia of chronic disease/acute blood loss anemia -stable post transfusion   Ischemic cardiomyopathy-EF 35% 9/48/54/ Chronic systolic heart failure -lasix per renal and cardiology-- 120 mg IV   Paroxysmal atrial fibrillation/Long term current use of anticoagulant therapy -eliquis resumed   CAD with LCX disease and stents to LAD -Currently asymptomatic and no evidence of chest pain or dyspnea on exertion   Chronic kidney disease, stage IV (severe) -BUN slightly elevated and likely related to current bleeding? nephro following vascular for vein mapping -weight up   DM type 2, uncontrolled, with renal complications - SSI - 20 units daily   Chronically elevated transaminase level trend   GERD  -Continue PPI   OSA on CPAP -Provide nocturnal CPAP   Hyperlipidemia -Crestor on hold   H/O ventricular fibrillation-March 2016/BiV ICD (St Jude) gen change 04/10/15 -No longer appears to be on amiodarone based on medication reconciliation  sheet  Hypokalemia -replace   Code Status: full Family Communication: patient/family Disposition Plan: home when stable   Consultants:  GI  Surgery  neprho  Procedures:      HPI/Subjective: Breathing better, swelling better- family to bring him dinner from [place]  Objective: Filed Vitals:   08/27/15 0457  BP: 83/59  Pulse: 100  Temp: 98 F (36.7 C)  Resp:     Intake/Output Summary (Last 24 hours) at 08/27/15 1205 Last data filed at 08/27/15 1032  Gross per 24 hour  Intake    360 ml  Output   3825 ml  Net  -3465 ml   Filed Weights   08/23/15 1531 08/26/15 0513 08/27/15 0457  Weight: 93.895 kg (207 lb) 100.472 kg (221 lb 8 oz) 98.385 kg (216 lb 14.4 oz)    Exam:   General:  Awake, NAD  Cardiovascular: rrr  Respiratory: clear  Abdomen: +BS, soft  Musculoskeletal: +edema LE  Data Reviewed: Basic Metabolic Panel:  Recent Labs Lab 08/23/15 0215 08/24/15 0336 08/25/15 1410 08/26/15 0428 08/27/15 0406  NA 140 139 130* 135 134*  K 4.1 4.2 5.0 4.5 3.0*  CL 106 108 100* 102 101  CO2  --  14* 17* 23 23  GLUCOSE 127* 116* 148* 98 78  BUN 48* 45* 60* 62* 57*  CREATININE 4.00* 3.53* 4.17* 4.06* 3.57*  CALCIUM  --  9.4 9.2 9.1 8.5*  PHOS  --   --   --  5.0* 3.8   Liver Function Tests:  Recent Labs Lab 08/26/15 0428 08/27/15 0406  ALBUMIN 2.7* 2.7*   No results for input(s): LIPASE, AMYLASE in the last 168 hours. No results for input(s): AMMONIA in the last 168 hours. CBC:  Recent Labs Lab 08/23/15 0230 08/23/15 0556 08/23/15  1356 08/24/15 0336 08/27/15 0810  WBC 7.1 7.1 5.4 7.4 5.5  NEUTROABS 4.9  --  3.8  --   --   HGB 10.6* 9.0* 10.7* 12.4* 10.2*  HCT 33.0* 27.9* 32.5* 38.1* 30.0*  MCV 100.3* 99.6 97.0 99.7 97.7  PLT 151 129* 126* 146* 114*   Cardiac Enzymes: No results for input(s): CKTOTAL, CKMB, CKMBINDEX, TROPONINI in the last 168 hours. BNP (last 3 results)  Recent Labs  04/23/15 0503 07/21/15 0947  08/02/15 2129  BNP 1218.9* 781.6* 1004.6*    ProBNP (last 3 results)  Recent Labs  09/27/14 0523 11/19/14 0745  PROBNP 9164.0* 5233.0*    CBG:  Recent Labs Lab 08/26/15 1125 08/26/15 1623 08/26/15 2124 08/27/15 0735 08/27/15 1123  GLUCAP 78 95 142* 85 130*    No results found for this or any previous visit (from the past 240 hour(s)).   Studies: No results found.  Scheduled Meds: . apixaban  5 mg Oral BID  . furosemide  120 mg Intravenous BID  . insulin aspart  0-15 Units Subcutaneous TID WC  . insulin aspart  0-5 Units Subcutaneous QHS  . insulin glargine  20 Units Subcutaneous Q24H  . metoprolol succinate  12.5 mg Oral BID  . pantoprazole  40 mg Oral Daily  . polyethylene glycol  17 g Oral BID  . potassium chloride  40 mEq Oral Once  . rosuvastatin  10 mg Oral q1800  . sodium bicarbonate  650 mg Oral TID  . sodium chloride  3 mL Intravenous Q12H   Continuous Infusions:  Antibiotics Given (last 72 hours)    None      Principal Problem:   Bleeding from colostomy stoma Active Problems:   Ischemic cardiomyopathy-EF 35% 04/24/15 echo   Long term current use of anticoagulant therapy   CAD with LCX disease and stents to LAD   Chronic systolic heart failure   Paroxysmal atrial fibrillation   Chronic hypotension   Chronic kidney disease, stage IV (severe)   DM type 2, uncontrolled, with renal complications   GERD (gastroesophageal reflux disease)   OSA on CPAP   Hyperlipidemia   H/O ventricular fibrillation-March 2016   BiV ICD (St Jude) gen change 04/10/15   Chronically elevated transaminase level-Amiodarone resumed 04/24/15   Constipation   Anemia of chronic disease    Time spent: 25 min    [name], [name]  [facility] Pager [phone]. If 7PM-7AM, please contact night-coverage at [email or web address], password [facility] 08/27/2015, 12:05 PM  LOS: 4 days

## 2015-08-27 NOTE — Progress Notes (Signed)
Patient ID: [name], male   DOB: 1956/11/30, 59 y.o.   MRN: [id number]   SUBJECTIVE: [name] diuresed well yesterday, weight down 5 lbs.  Still up from baseline.  Denies dyspnea or chest pain.  HR 100s with BiV pacing, suspect atrial fibrillation.   Scheduled Meds: . apixaban  5 mg Oral BID  . furosemide  120 mg Intravenous BID  . insulin aspart  0-15 Units Subcutaneous TID WC  . insulin aspart  0-5 Units Subcutaneous QHS  . insulin glargine  20 Units Subcutaneous Q24H  . metoprolol succinate  12.5 mg Oral BID  . pantoprazole  40 mg Oral Daily  . polyethylene glycol  17 g Oral BID  . potassium chloride  40 mEq Oral Once  . potassium chloride  40 mEq Oral Once  . rosuvastatin  10 mg Oral q1800  . sodium bicarbonate  650 mg Oral TID  . sodium chloride  3 mL Intravenous Q12H   Continuous Infusions:  PRN Meds:.acetaminophen **OR** acetaminophen, albuterol, benzonatate, fluticasone, ondansetron **OR** ondansetron (ZOFRAN) IV    Filed Vitals:   08/26/15 0841 08/26/15 1626 08/26/15 2121 08/27/15 0457  BP: 83/61 84/54 93/58  83/59  Pulse:  101 109 100  Temp:  97.8 F (36.6 C) 98 F (36.7 C) 98 F (36.7 C)  TempSrc:  Oral Oral Oral  Resp:      Height:      Weight:    216 lb 14.4 oz (98.385 kg)  SpO2:  97% 100% 98%    Intake/Output Summary (Last 24 hours) at 08/27/15 0736 Last data filed at 08/27/15 0458  Gross per 24 hour  Intake    360 ml  Output   3450 ml  Net  -3090 ml    LABS: Basic Metabolic Panel:  Recent Labs  08/26/15 0428 08/27/15 0406  NA 135 134*  K 4.5 3.0*  CL 102 101  CO2 23 23  GLUCOSE 98 78  BUN 62* 57*  CREATININE 4.06* 3.57*  CALCIUM 9.1 8.5*  PHOS 5.0* 3.8   Liver Function Tests:  Recent Labs  08/26/15 0428 08/27/15 0406  ALBUMIN 2.7* 2.7*   No results for input(s): LIPASE, AMYLASE in the last 72 hours. CBC: No results for input(s): WBC, NEUTROABS, HGB, HCT, MCV, PLT in the last 72 hours. Cardiac Enzymes: No results for  input(s): CKTOTAL, CKMB, CKMBINDEX, TROPONINI in the last 72 hours. BNP: Invalid input(s): POCBNP D-Dimer: No results for input(s): DDIMER in the last 72 hours. Hemoglobin A1C: No results for input(s): HGBA1C in the last 72 hours. Fasting Lipid Panel: No results for input(s): CHOL, HDL, LDLCALC, TRIG, CHOLHDL, LDLDIRECT in the last 72 hours. Thyroid Function Tests: No results for input(s): TSH, T4TOTAL, T3FREE, THYROIDAB in the last 72 hours.  Invalid input(s): FREET3 Anemia Panel:  Recent Labs  08/26/15 0428  VITAMINB12 3271*  FOLATE 17.7  FERRITIN 1419*  TIBC 280  IRON 123  RETICCTPCT 3.4*    RADIOLOGY: Ct Abdomen Pelvis Wo Contrast  07/31/2015   CLINICAL DATA:  Followup rectal carcinoma. Previous surgery, chemotherapy, and radiation therapy. Personal history of prostate carcinoma.  EXAM: CT ABDOMEN AND PELVIS WITHOUT CONTRAST  TECHNIQUE: Multidetector CT imaging of the abdomen and pelvis was performed following the standard protocol without IV contrast.  COMPARISON:  04/05/2015  FINDINGS: Lower chest: Small bilateral pleural effusions show no significant change.  Hepatobiliary: No mass visualized on this unenhanced exam. Tiny calcified gallstone again noted, without evidence of cholecystitis or biliary ductal dilatation.  Pancreas:  No mass or inflammatory process visualized on this unenhanced exam.  Spleen:  Within normal limits in size.  Adrenal Glands:  No masses identified.  Kidneys/Urinary tract: No evidence of urolithiasis or hydronephrosis.  Stomach/Bowel/Peritoneum: Left lower quadrant colostomy again demonstrated. Midline presacral soft tissue density is stable an likely due to post treatment change. There is an asymmetric ill-defined soft tissue density in the right perirectal region. This shows mild increase in size since previous study, currently measuring 2.8 x 2.8 cm on image 79, compared to 2.3 x 2.6 cm previously. This is suspicious for recurrent carcinoma.   Vascular/Lymphatic: No pathologically enlarged lymph nodes identified. No abdominal aortic aneurysm or other significant retroperitoneal abnormality demonstrated.  Reproductive:  Brachytherapy seeds again noted in the prostate.  Other:  None.  Musculoskeletal:  No suspicious bone lesions identified.  IMPRESSION: Mild increase in size of asymmetric right perirectal soft tissue density, suspicious for recurrent carcinoma.  Stable midline presacral soft tissue density, consistent with post treatment change.  Cholelithiasis.  No radiographic evidence of cholecystitis.  Stable small bilateral pleural effusions.   Electronically Signed   By: Earle Gell M.D.   On: 07/31/2015 10:56   Dg Chest 2 View  08/02/2015   CLINICAL DATA:  Shortness of breath for 2-3 days. Intermittent. Chest pain.  EXAM: CHEST  2 VIEW  COMPARISON:  07/21/2015  FINDINGS: Multi lead left-sided pacemaker remains in place. Cardiomegaly is unchanged. Improving right upper lobe patchy opacity with minimal residual ill-defined opacity in the perihilar region. The lungs are hyperinflated. No new consolidation. No pleural effusion or pneumothorax. No acute osseous abnormalities are seen.  IMPRESSION: 1. Resolving right upper lobe opacity consistent with improving pneumonia. Minimal residual perihilar opacity persists. Recommend additional follow-up in 2-3 weeks to ensure or complete clearing. 2. Chronic cardiomegaly.  No acute process.   Electronically Signed   By: Jeb Levering M.D.   On: 08/02/2015 23:07   Dg Abd Acute W/chest  08/23/2015   CLINICAL DATA:  GI bleed.  Abdominal distension.  EXAM: DG ABDOMEN ACUTE W/ 1V CHEST  COMPARISON:  CT abdomen/ pelvis 07/31/2015 chest radiographs 08/02/2015  FINDINGS: Multi lead left-sided pacemaker in place. Cardiomegaly is unchanged. Previous right upper lobe opacity has resolved. The lungs are clear.  There is no free intra-abdominal air. No dilated bowel loops to suggest obstruction. Large volume of stool  in the right colon, moderate stool throughout the remainder the colon. The left lower quadrant ostomy is not well seen. No radiopaque calculi. No acute osseous abnormalities are seen. No radiopaque calculi. There are vascular calcifications. No acute osseous abnormalities are seen.  IMPRESSION: 1. Large volume of colonic stool. No bowel dilatation to suggest obstruction. 2. Resolved right upper lobe opacity.  No acute pulmonary process.   Electronically Signed   By: Jeb Levering M.D.   On: 08/23/2015 03:12    PHYSICAL EXAM General: NAD Neck: JVP 12 cm, no thyromegaly or thyroid nodule.  Lungs: Clear to auscultation bilaterally with normal respiratory effort. CV: Nondisplaced PMI.  Heart tachy, fairly regular S1/S2, no S3/S4, no murmur.  2+ edema to knees.  N Abdomen: Soft, nontender, no hepatosplenomegaly, no distention.  Neurologic: Alert and oriented x 3.  Psych: Normal affect. Extremities: No clubbing or cyanosis.   TELEMETRY: Reviewed telemetry pt in paced in 100s, suspect underlying atrial fibrillation  ASSESSMENT AND PLAN: 59 yo with history of ischemic cardiomyopathy with chronic systolic CHF, chronic atrial fibrillation, CAD, CKD IV was admitted with hematochezia and developed acute/chronic systolic  CHF.  1. Acute on chronic systolic CHF: Ischemic cardiomyopathy. Deemed not transplant or VAD candidate with comorbities and advanced CKD. CRT-D St Jude. He remains volume overloaded but diuresed well yesterday.  BP soft so off hydralazine/nitrates.   - Still needs volume off, would continue Lasix 120 mg IV bid today.  - Continue Toprol XL 12.5 daily. - Hold hydralazine/nitrates with soft BP.  2. Atrial fibrillation: Chronic. CHADS2Vasc Score 5. Off ranexa and amiodarone due to persistency of atrial fibrillation. Had traumatic SAH in 1/16 but stable back on Eliquis. Admitted with hematochezia but now back on Eliquis.  - Continue Toprol XL 12.5 daily and Eliquis.  - Will interrogate  CRT-D device => interested to see efficiency of BiV pacing and HR control in setting of atrial fibrillation.  Could be candidate for AV nodal ablation with pacing in the future.   3. CAD: NSTEMI earlier this year. Stress test following NSTEMI without high ischemic burden. No cath due to renal failure.  No chest pain. - Continue medical therapy. He is not on ASA due to Eliquis use.  - Restart statin, Crestor at 10 mg daily due to renal disease.  4. CKD: Creatinine coming down with diuresis, 3.57 this morning.  Plan for AV fistula placement.  Would probably give one more day of good diuresis then should be stable to have this procedure.  Will need vascular surgery guidance on when procedure will be and when he will need to stop Eliquis.   5. Lower GI bleeding: H/O rectal cancer 2008, has diverting colostomy. He had bleeding this admission from stoma which has stopped, Eliquis restarted.  Need CBC today.   Loralie Champagne 08/27/2015 7:45 AM

## 2015-08-27 NOTE — Progress Notes (Signed)
  [facility] Progress Note    Assessment/ Plan:   1. Likely Progression of CKD: Cr has improved today from 4.06 to 3.57. He had excellent response to Lasix 120 mg IV BID with UOP 3,450 ml over last 24 hours. Weight down 5 lbs. Will continue with current Lasix dose since still significantly volume up. Awaiting PTH level. Appreciate vascular surgery's consultation for fistula placement. Will follow up their note. RUE vein mapping ordered. Appreciate cardiology's preop evaluation- patient high risk for any procedure but should be ok once CHF has improved.  2. Hypokalemia: K 3.0 this morning. Likely due to diuresis with Lasix. Replaced with K-Dur 40 mEq.  3. Anemia of Chronic Disease: Iron studies showed iron 123, sat 44%, and ferritin 1419 most consistent with anemia of chronic disease. His Hgb is stable at 12.4.   Subjective:   No acute events overnight. Patient denies any nausea or SOB. He reports good appetite. Notes swelling in arms and legs slightly down.    Objective:   BP 83/59 mmHg  Pulse 100  Temp(Src) 98 F (36.7 C) (Oral)  Resp 15  Ht 5\' 10"  (1.778 m)  Wt 216 lb 14.4 oz (98.385 kg)  BMI 31.12 kg/m2  SpO2 98%  Intake/Output Summary (Last 24 hours) at 08/27/15 0902 Last data filed at 08/27/15 0816  Gross per 24 hour  Intake    360 ml  Output   3550 ml  Net  -3190 ml   Weight change: -4 lb 9.6 oz (-2.087 kg)  Physical Exam: Gen: alert, resting in bed, NAD CVS: RRR, no m/g/r Resp: CTA bilaterally, no crackles appreciated Abd: BS+, soft, non-tender Ext: 3+ pitting edema to the mid-thighs bilaterally, slightly improved from yesterday  Imaging: No results found.  Labs: BMET  Recent Labs Lab 08/23/15 0215 08/24/15 0336 08/25/15 1410 08/26/15 0428 08/27/15 0406  NA 140 139 130* 135 134*  K 4.1 4.2 5.0 4.5 3.0*  CL 106 108 100* 102 101  CO2  --  14* 17* 23 23  GLUCOSE 127* 116* 148* 98 78  BUN 48* 45* 60* 62* 57*  CREATININE 4.00* 3.53* 4.17*  4.06* 3.57*  CALCIUM  --  9.4 9.2 9.1 8.5*  PHOS  --   --   --  5.0* 3.8   CBC  Recent Labs Lab 08/23/15 0230 08/23/15 0556 08/23/15 1356 08/24/15 0336 08/27/15 0810  WBC 7.1 7.1 5.4 7.4 5.5  NEUTROABS 4.9  --  3.8  --   --   HGB 10.6* 9.0* 10.7* 12.4* 10.2*  HCT 33.0* 27.9* 32.5* 38.1* 30.0*  MCV 100.3* 99.6 97.0 99.7 97.7  PLT 151 129* 126* 146* PENDING    Medications:    . apixaban  5 mg Oral BID  . furosemide  120 mg Intravenous BID  . insulin aspart  0-15 Units Subcutaneous TID WC  . insulin aspart  0-5 Units Subcutaneous QHS  . insulin glargine  20 Units Subcutaneous Q24H  . metoprolol succinate  12.5 mg Oral BID  . pantoprazole  40 mg Oral Daily  . polyethylene glycol  17 g Oral BID  . potassium chloride  40 mEq Oral Once  . rosuvastatin  10 mg Oral q1800  . sodium bicarbonate  650 mg Oral TID  . sodium chloride  3 mL Intravenous Q12H      [name], MD, MPH IMTS- PGY-2  08/27/2015, 9:02 AM

## 2015-08-28 ENCOUNTER — Encounter (HOSPITAL_COMMUNITY): Payer: Self-pay | Admitting: Nurse Practitioner

## 2015-08-28 ENCOUNTER — Encounter (HOSPITAL_COMMUNITY)
Admission: EM | Disposition: A | Discharge status: Home / Self Care | Payer: Self-pay | Source: Home / Self Care | Attending: Internal Medicine

## 2015-08-28 DIAGNOSIS — I482 Chronic atrial fibrillation, unspecified: Secondary | ICD-10-CM | POA: Insufficient documentation

## 2015-08-28 HISTORY — PX: ELECTROPHYSIOLOGIC STUDY: SHX172A

## 2015-08-28 LAB — GLUCOSE, CAPILLARY
GLUCOSE-CAPILLARY: 90 mg/dL (ref 65–99)
Glucose-Capillary: 97 mg/dL (ref 65–99)
Glucose-Capillary: 83 mg/dL (ref 65–99)
Glucose-Capillary: 156 mg/dL — ABNORMAL HIGH (ref 65–99)

## 2015-08-28 LAB — CBC
HEMATOCRIT: 29 % — AB (ref 39.0–52.0)
HEMOGLOBIN: 9.8 g/dL — AB (ref 13.0–17.0)
MCH: 32.9 pg (ref 26.0–34.0)
MCHC: 33.8 g/dL (ref 30.0–36.0)
MCV: 97.3 fL (ref 78.0–100.0)
Platelets: 103 10*3/uL — ABNORMAL LOW (ref 150–400)
RBC: 2.98 MIL/uL — ABNORMAL LOW (ref 4.22–5.81)
RDW: 15.5 % (ref 11.5–15.5)
WBC: 6 10*3/uL (ref 4.0–10.5)

## 2015-08-28 LAB — BASIC METABOLIC PANEL
ANION GAP: 9 (ref 5–15)
BUN: 53 mg/dL — ABNORMAL HIGH (ref 6–20)
CO2: 24 mmol/L (ref 22–32)
Calcium: 8.6 mg/dL — ABNORMAL LOW (ref 8.9–10.3)
Chloride: 105 mmol/L (ref 101–111)
Creatinine, Ser: 3.38 mg/dL — ABNORMAL HIGH (ref 0.61–1.24)
GFR, EST AFRICAN AMERICAN: 21 mL/min — AB (ref >60)
GFR, EST NON AFRICAN AMERICAN: 18 mL/min — AB (ref >60)
Glucose, Bld: 96 mg/dL (ref 65–99)
Potassium: 3.4 mmol/L — ABNORMAL LOW (ref 3.5–5.1)
SODIUM: 138 mmol/L (ref 135–145)

## 2015-08-28 SURGERY — AV NODE ABLATION

## 2015-08-28 MED ORDER — ONDANSETRON HCL 4 MG/2ML IJ SOLN: 4.0000 mg | Freq: Four times a day (QID) | INTRAMUSCULAR | Status: DC | PRN

## 2015-08-28 MED ORDER — ACETAMINOPHEN 325 MG PO TABS: 650.0000 mg | ORAL_TABLET | ORAL | Status: DC | PRN

## 2015-08-28 MED ORDER — MIDAZOLAM HCL 5 MG/5ML IJ SOLN
INTRAMUSCULAR | Status: AC
  Filled 2015-08-28: qty 25

## 2015-08-28 MED ORDER — BUPIVACAINE HCL (PF) 0.25 % IJ SOLN
INTRAMUSCULAR | Status: DC | PRN
  Administered 2015-08-28: 20 mL

## 2015-08-28 MED ORDER — MIDAZOLAM HCL 5 MG/5ML IJ SOLN
INTRAMUSCULAR | Status: DC | PRN
  Administered 2015-08-28 (×2): 1 mg via INTRAVENOUS

## 2015-08-28 MED ORDER — FENTANYL CITRATE (PF) 100 MCG/2ML IJ SOLN
INTRAMUSCULAR | Status: AC
  Filled 2015-08-28: qty 4

## 2015-08-28 MED ORDER — BUPIVACAINE HCL (PF) 0.25 % IJ SOLN
INTRAMUSCULAR | Status: AC
  Filled 2015-08-28: qty 30

## 2015-08-28 MED ORDER — SODIUM CHLORIDE 0.9 % IV SOLN: 250.0000 mL | INTRAVENOUS | Status: DC | PRN

## 2015-08-28 MED ORDER — SODIUM CHLORIDE 0.9 % IJ SOLN: 3.0000 mL | INTRAMUSCULAR | Status: DC | PRN

## 2015-08-28 MED ORDER — SODIUM CHLORIDE 0.9 % IJ SOLN
3.0000 mL | Freq: Two times a day (BID) | INTRAMUSCULAR | Status: DC
  Administered 2015-08-28 – 2015-08-29 (×3): 3 mL via INTRAVENOUS

## 2015-08-28 MED ORDER — DM-GUAIFENESIN ER 30-600 MG PO TB12
1.0000 | ORAL_TABLET | Freq: Two times a day (BID) | ORAL | Status: DC
  Administered 2015-08-28 – 2015-08-30 (×4): 1 via ORAL
  Filled 2015-08-28 (×4): qty 1

## 2015-08-28 MED ORDER — POTASSIUM CHLORIDE CRYS ER 20 MEQ PO TBCR
40.0000 meq | EXTENDED_RELEASE_TABLET | Freq: Once | ORAL | Status: AC
  Administered 2015-08-28: 40 meq via ORAL
  Filled 2015-08-28: qty 2

## 2015-08-28 SURGICAL SUPPLY — 9 items
BAG SNAP BAND KOVER 36X36 (MISCELLANEOUS) ×3 IMPLANT
CATH BLAZERPRIME XP (ABLATOR) ×2 IMPLANT
CATH JOSEPH QUAD ALLRED 6F REP (CATHETERS) ×2 IMPLANT
PACK EP LATEX FREE (CUSTOM PROCEDURE TRAY) ×3
PACK EP LF (CUSTOM PROCEDURE TRAY) ×1 IMPLANT
PAD DEFIB LIFELINK (PAD) ×3 IMPLANT
SHEATH PINNACLE 6F 10CM (SHEATH) ×3 IMPLANT
SHEATH PINNACLE 8F 10CM (SHEATH) ×3 IMPLANT
SHIELD RADPAD SCOOP 12X17 (MISCELLANEOUS) ×3 IMPLANT

## 2015-08-28 NOTE — Consult Note (Signed)
Vein mapping results pending. Will follow up with pt tomorrow when results available.  Ruta Hinds, MD [facility] Office: [phone] Pager: [phone]

## 2015-08-28 NOTE — Progress Notes (Signed)
SUBJECTIVE: The patient is improved  today.  Shortness of breath is better after diuresis  CURRENT MEDICATIONS: . apixaban  5 mg Oral BID  . furosemide  120 mg Intravenous BID  . insulin aspart  0-15 Units Subcutaneous TID WC  . insulin aspart  0-5 Units Subcutaneous QHS  . insulin glargine  20 Units Subcutaneous Q24H  . metoprolol succinate  12.5 mg Oral BID  . pantoprazole  40 mg Oral Daily  . polyethylene glycol  17 g Oral BID  . potassium chloride  40 mEq Oral Once  . rosuvastatin  10 mg Oral q1800  . sodium bicarbonate  650 mg Oral TID  . sodium chloride  3 mL Intravenous Q12H      OBJECTIVE: Physical Exam: Filed Vitals:   08/27/15 0457 08/27/15 1430 08/27/15 1959 08/28/15 0522  BP: 83/59 76/52 97/59  81/58  Pulse: 100 109 109 101  Temp: 98 F (36.7 C) 97.9 F (36.6 C) 98.6 F (37 C) 98.5 F (36.9 C)  TempSrc: Oral Oral Oral Oral  Resp:  17    Height:      Weight: 216 lb 14.4 oz (98.385 kg)   213 lb 8 oz (96.843 kg)  SpO2: 98% 97% 100% 98%    Intake/Output Summary (Last 24 hours) at 08/28/15 0834 Last data filed at 08/28/15 0524  Gross per 24 hour  Intake    740 ml  Output   3725 ml  Net  -2985 ml    Telemetry reveals atrial fibrillation with RVR  GEN- The patient is well appearing, alert and oriented x 3 today.   Head- normocephalic, atraumatic Eyes-  Sclera clear, conjunctiva pink Ears- hearing intact Oropharynx- clear Neck- supple  Lungs- Clear to ausculation bilaterally, normal work of breathing Heart- Tachycardic irregular rate and rhythm  GI- soft, NT, ND, + BS Extremities- no clubbing, cyanosis, or edema Skin- no rash or lesion Psych- euthymic mood, full affect Neuro- strength and sensation are intact  LABS: Basic Metabolic Panel:  Recent Labs  08/26/15 0428 08/27/15 0406 08/28/15 0444  NA 135 134* 138  K 4.5 3.0* 3.4*  CL 102 101 105  CO2 23 23 24   GLUCOSE 98 78 96  BUN 62* 57* 53*  CREATININE 4.06* 3.57* 3.38*  CALCIUM 9.1   8.6 8.5* 8.6*  PHOS 5.0* 3.8  --    Liver Function Tests:  Recent Labs  08/26/15 0428 08/27/15 0406  ALBUMIN 2.7* 2.7*   CBC:  Recent Labs  08/27/15 0810 08/28/15 0444  WBC 5.5 6.0  HGB 10.2* 9.8*  HCT 30.0* 29.0*  MCV 97.7 97.3  PLT 114* 103*   Anemia Panel:  Recent Labs  08/26/15 0428  VITAMINB12 3271*  FOLATE 17.7  FERRITIN 1419*  TIBC 280  IRON 123  RETICCTPCT 3.4*    RADIOLOGY: Dg Chest 2 View 08/02/2015   CLINICAL DATA:  Shortness of breath for 2-3 days. Intermittent. Chest pain.  EXAM: CHEST  2 VIEW  COMPARISON:  07/21/2015  FINDINGS: Multi lead left-sided pacemaker remains in place. Cardiomegaly is unchanged. Improving right upper lobe patchy opacity with minimal residual ill-defined opacity in the perihilar region. The lungs are hyperinflated. No new consolidation. No pleural effusion or pneumothorax. No acute osseous abnormalities are seen.  IMPRESSION: 1. Resolving right upper lobe opacity consistent with improving pneumonia. Minimal residual perihilar opacity persists. Recommend additional follow-up in 2-3 weeks to ensure or complete clearing. 2. Chronic cardiomegaly.  No acute process.   Electronically Signed   By:  Jeb Levering M.D.   On: 08/02/2015 23:07   ASSESSMENT AND PLAN:  Principal Problem:   Bleeding from colostomy stoma Active Problems:   Ischemic cardiomyopathy-EF 35% 04/24/15 echo   Long term current use of anticoagulant therapy   CAD with LCX disease and stents to LAD   Chronic systolic heart failure   Paroxysmal atrial fibrillation   Chronic hypotension   Chronic kidney disease, stage IV (severe)   DM type 2, uncontrolled, with renal complications   GERD (gastroesophageal reflux disease)   OSA on CPAP   Hyperlipidemia   H/O ventricular fibrillation-March 2016   BiV ICD (St Jude) gen change 04/10/15   Chronically elevated transaminase level-Amiodarone resumed 04/24/15   Constipation   Anemia of chronic disease    1.  AF with  RVR The patient has AF with RVR and his rates have not been able to be controlled.  Rate limiting medications are limited by hypotension. Discussed with Dr Aundra Dubin and Dr Lovena Le yesterday.  He is only CRT pacing 48% of the time.  Risks, benefits to AVN ablation were discussed with the patient who wishes to proceed.  He is undergoing evaluation for fistula placement for HD which will increase his infection risk long term, but it is felt that he has no other option for rate control Will hold Eliquis this morning CHADS2VASC is at least 6  2.  ICM No recent ischemic symptoms  3.  Acute on chronic systolic heart failure Continue diuresis per AHF team  4.  CKD Per nephrology  Chanetta Marshall, NP 08/28/2015 8:40 AM  I have seen and examined this patient with Chanetta Marshall.  Agree with above, note added to reflect my findings.  On exam, tachycardic, irregular rhythm, no murmurs, slight crackles at the bases.  Patient with elevated HR and low BP without options for rate control.  Has CRT and low rate of BiV pacing currently.  Due to no other medical choices, will perform AVN ablation to restore CRT pacing.    Will M. Camnitz MD 08/28/2015 10:37 AM

## 2015-08-28 NOTE — Progress Notes (Signed)
[facility] Progress Note    Assessment/ Plan:   1. Likely Progression of CKD: Cr continues to improve, down from 3.57 to 3.38. He is diuresing very well on Lasix 120 mg IV BID with 4,125 ml urine output. Weight down another 3 lbs. Will change over to PO Lasix 120 mg BID and see if can continue good UOP. Can gradually decrease dose but will definitely need more than home dose of 40 mg PO at discharge. PTH level right at cut off at 65. Will need repeat level in 3 months. Appreciate vascular surgery's consultation for fistula placement, likely to have fistula on right. RUE vein mapping completed.  2. Hypokalemia: K 3.4 this morning. Likely due to diuresis with Lasix. Replaced with K-Dur 40 mEq.  3. Anemia of Chronic Disease: Iron studies showed iron 123, sat 44%, and ferritin 1419 most consistent with anemia of chronic disease. Hgb gradually decreasing from 12.4 to 10.2 to 9.8. No evidence of bleeding. Will continue to monitor.   Subjective:   No acute events overnight. Patient denies SOB, nausea, confusion. UOP excellent.    Objective:   BP 81/58 mmHg  Pulse 101  Temp(Src) 98.5 F (36.9 C) (Oral)  Resp 17  Ht 5\' 10"  (1.778 m)  Wt 213 lb 8 oz (96.843 kg)  BMI 30.63 kg/m2  SpO2 98%  Intake/Output Summary (Last 24 hours) at 08/28/15 0801 Last data filed at 08/28/15 0524  Gross per 24 hour  Intake   1100 ml  Output   4125 ml  Net  -3025 ml   Weight change: -3 lb 6.4 oz (-1.542 kg)  Physical Exam: Gen: alert, resting in bed, NAD CVS: RRR, no m/g/r Resp: CTA bilaterally, no crackles appreciated Abd: BS+, soft, non-tender Ext: 2+ pitting edema to the mid-thighs bilaterally, improved from yesterday  Imaging: No results found.  Labs: BMET  Recent Labs Lab 08/23/15 0215 08/24/15 0336 08/25/15 1410 08/26/15 0428 08/27/15 0406 08/28/15 0444  NA 140 139 130* 135 134* 138  K 4.1 4.2 5.0 4.5 3.0* 3.4*  CL 106 108 100* 102 101 105  CO2  --  14* 17* 23 23 24    GLUCOSE 127* 116* 148* 98 78 96  BUN 48* 45* 60* 62* 57* 53*  CREATININE 4.00* 3.53* 4.17* 4.06* 3.57* 3.38*  CALCIUM  --  9.4 9.2 9.1  8.6 8.5* 8.6*  PHOS  --   --   --  5.0* 3.8  --    CBC  Recent Labs Lab 08/23/15 0230  08/23/15 1356 08/24/15 0336 08/27/15 0810 08/28/15 0444  WBC 7.1  < > 5.4 7.4 5.5 6.0  NEUTROABS 4.9  --  3.8  --   --   --   HGB 10.6*  < > 10.7* 12.4* 10.2* 9.8*  HCT 33.0*  < > 32.5* 38.1* 30.0* 29.0*  MCV 100.3*  < > 97.0 99.7 97.7 97.3  PLT 151  < > 126* 146* 114* 103*  < > = values in this interval not displayed.  Medications:    . apixaban  5 mg Oral BID  . furosemide  120 mg Intravenous BID  . insulin aspart  0-15 Units Subcutaneous TID WC  . insulin aspart  0-5 Units Subcutaneous QHS  . insulin glargine  20 Units Subcutaneous Q24H  . metoprolol succinate  12.5 mg Oral BID  . pantoprazole  40 mg Oral Daily  . polyethylene glycol  17 g Oral BID  . potassium chloride  40 mEq Oral Once  .  rosuvastatin  10 mg Oral q1800  . sodium bicarbonate  650 mg Oral TID  . sodium chloride  3 mL Intravenous Q12H      [name], MD, MPH IMTS- PGY-2  08/28/2015, 8:01 AM

## 2015-08-28 NOTE — Progress Notes (Signed)
Site area: RFV x3 Site Prior to Removal:  Level 0 Pressure Applied For:38min Manual:   yes Patient Status During Pull:stable   Post Pull Site:  Level Post Pull Instructions Given:  yes Post Pull Pulses Present: N/A Dressing Applied: clear  Bedrest begins @ 6389 till 2220 Comments:

## 2015-08-28 NOTE — Progress Notes (Signed)
Patient has refused CPAP for the night. RT will continue to monitor.  

## 2015-08-28 NOTE — Progress Notes (Signed)
Patient ID: LAINE GIOVANETTI, male   DOB: Jul 14, 1956, 59 y.o.   MRN: [id number]   SUBJECTIVE: Mr Matsuura diuresed well yesterday, weight down 3 lbs.  Still up from baseline.  Denies dyspnea or chest pain.  HR 100s with v-pacing, underlying atrial fibrillation.   Device interrogation: Only BiV paces 48% of the time.   Scheduled Meds: . apixaban  5 mg Oral BID  . furosemide  120 mg Intravenous BID  . insulin aspart  0-15 Units Subcutaneous TID WC  . insulin aspart  0-5 Units Subcutaneous QHS  . insulin glargine  20 Units Subcutaneous Q24H  . metoprolol succinate  12.5 mg Oral BID  . pantoprazole  40 mg Oral Daily  . polyethylene glycol  17 g Oral BID  . potassium chloride  40 mEq Oral Once  . rosuvastatin  10 mg Oral q1800  . sodium bicarbonate  650 mg Oral TID  . sodium chloride  3 mL Intravenous Q12H   Continuous Infusions:  PRN Meds:.acetaminophen **OR** acetaminophen, albuterol, benzonatate, fluticasone, ondansetron **OR** ondansetron (ZOFRAN) IV    Filed Vitals:   08/27/15 0457 08/27/15 1430 08/27/15 1959 08/28/15 0522  BP: 83/59 76/52 97/59  81/58  Pulse: 100 109 109 101  Temp: 98 F (36.7 C) 97.9 F (36.6 C) 98.6 F (37 C) 98.5 F (36.9 C)  TempSrc: Oral Oral Oral Oral  Resp:  17    Height:      Weight: 216 lb 14.4 oz (98.385 kg)   213 lb 8 oz (96.843 kg)  SpO2: 98% 97% 100% 98%    Intake/Output Summary (Last 24 hours) at 08/28/15 0734 Last data filed at 08/28/15 0524  Gross per 24 hour  Intake   1100 ml  Output   4125 ml  Net  -3025 ml    LABS: Basic Metabolic Panel:  Recent Labs  08/26/15 0428 08/27/15 0406 08/28/15 0444  NA 135 134* 138  K 4.5 3.0* 3.4*  CL 102 101 105  CO2 23 23 24   GLUCOSE 98 78 96  BUN 62* 57* 53*  CREATININE 4.06* 3.57* 3.38*  CALCIUM 9.1  8.6 8.5* 8.6*  PHOS 5.0* 3.8  --    Liver Function Tests:  Recent Labs  08/26/15 0428 08/27/15 0406  ALBUMIN 2.7* 2.7*   No results for input(s): LIPASE, AMYLASE in the last 72  hours. CBC:  Recent Labs  08/27/15 0810 08/28/15 0444  WBC 5.5 6.0  HGB 10.2* 9.8*  HCT 30.0* 29.0*  MCV 97.7 97.3  PLT 114* 103*   Cardiac Enzymes: No results for input(s): CKTOTAL, CKMB, CKMBINDEX, TROPONINI in the last 72 hours. BNP: Invalid input(s): POCBNP D-Dimer: No results for input(s): DDIMER in the last 72 hours. Hemoglobin A1C: No results for input(s): HGBA1C in the last 72 hours. Fasting Lipid Panel: No results for input(s): CHOL, HDL, LDLCALC, TRIG, CHOLHDL, LDLDIRECT in the last 72 hours. Thyroid Function Tests: No results for input(s): TSH, T4TOTAL, T3FREE, THYROIDAB in the last 72 hours.  Invalid input(s): FREET3 Anemia Panel:  Recent Labs  08/26/15 0428  VITAMINB12 3271*  FOLATE 17.7  FERRITIN 1419*  TIBC 280  IRON 123  RETICCTPCT 3.4*    RADIOLOGY: Ct Abdomen Pelvis Wo Contrast  07/31/2015   CLINICAL DATA:  Followup rectal carcinoma. Previous surgery, chemotherapy, and radiation therapy. Personal history of prostate carcinoma.  EXAM: CT ABDOMEN AND PELVIS WITHOUT CONTRAST  TECHNIQUE: Multidetector CT imaging of the abdomen and pelvis was performed following the standard protocol without IV contrast.  COMPARISON:  04/05/2015  FINDINGS: Lower chest: Small bilateral pleural effusions show no significant change.  Hepatobiliary: No mass visualized on this unenhanced exam. Tiny calcified gallstone again noted, without evidence of cholecystitis or biliary ductal dilatation.  Pancreas: No mass or inflammatory process visualized on this unenhanced exam.  Spleen:  Within normal limits in size.  Adrenal Glands:  No masses identified.  Kidneys/Urinary tract: No evidence of urolithiasis or hydronephrosis.  Stomach/Bowel/Peritoneum: Left lower quadrant colostomy again demonstrated. Midline presacral soft tissue density is stable an likely due to post treatment change. There is an asymmetric ill-defined soft tissue density in the right perirectal region. This shows mild  increase in size since previous study, currently measuring 2.8 x 2.8 cm on image 79, compared to 2.3 x 2.6 cm previously. This is suspicious for recurrent carcinoma.  Vascular/Lymphatic: No pathologically enlarged lymph nodes identified. No abdominal aortic aneurysm or other significant retroperitoneal abnormality demonstrated.  Reproductive:  Brachytherapy seeds again noted in the prostate.  Other:  None.  Musculoskeletal:  No suspicious bone lesions identified.  IMPRESSION: Mild increase in size of asymmetric right perirectal soft tissue density, suspicious for recurrent carcinoma.  Stable midline presacral soft tissue density, consistent with post treatment change.  Cholelithiasis.  No radiographic evidence of cholecystitis.  Stable small bilateral pleural effusions.   Electronically Signed   By: Earle Gell M.D.   On: 07/31/2015 10:56   Dg Chest 2 View  08/02/2015   CLINICAL DATA:  Shortness of breath for 2-3 days. Intermittent. Chest pain.  EXAM: CHEST  2 VIEW  COMPARISON:  07/21/2015  FINDINGS: Multi lead left-sided pacemaker remains in place. Cardiomegaly is unchanged. Improving right upper lobe patchy opacity with minimal residual ill-defined opacity in the perihilar region. The lungs are hyperinflated. No new consolidation. No pleural effusion or pneumothorax. No acute osseous abnormalities are seen.  IMPRESSION: 1. Resolving right upper lobe opacity consistent with improving pneumonia. Minimal residual perihilar opacity persists. Recommend additional follow-up in 2-3 weeks to ensure or complete clearing. 2. Chronic cardiomegaly.  No acute process.   Electronically Signed   By: Jeb Levering M.D.   On: 08/02/2015 23:07   Dg Abd Acute W/chest  08/23/2015   CLINICAL DATA:  GI bleed.  Abdominal distension.  EXAM: DG ABDOMEN ACUTE W/ 1V CHEST  COMPARISON:  CT abdomen/ pelvis 07/31/2015 chest radiographs 08/02/2015  FINDINGS: Multi lead left-sided pacemaker in place. Cardiomegaly is unchanged. Previous  right upper lobe opacity has resolved. The lungs are clear.  There is no free intra-abdominal air. No dilated bowel loops to suggest obstruction. Large volume of stool in the right colon, moderate stool throughout the remainder the colon. The left lower quadrant ostomy is not well seen. No radiopaque calculi. No acute osseous abnormalities are seen. No radiopaque calculi. There are vascular calcifications. No acute osseous abnormalities are seen.  IMPRESSION: 1. Large volume of colonic stool. No bowel dilatation to suggest obstruction. 2. Resolved right upper lobe opacity.  No acute pulmonary process.   Electronically Signed   By: Jeb Levering M.D.   On: 08/23/2015 03:12    PHYSICAL EXAM General: NAD Neck: JVP 12 cm, no thyromegaly or thyroid nodule.  Lungs: Clear to auscultation bilaterally with normal respiratory effort. CV: Nondisplaced PMI.  Heart tachy, fairly regular S1/S2, no S3/S4, no murmur.  2+ edema to knees.  N Abdomen: Soft, nontender, no hepatosplenomegaly, no distention.  Neurologic: Alert and oriented x 3.  Psych: Normal affect. Extremities: No clubbing or cyanosis.   TELEMETRY: Reviewed telemetry pt  in v-paced in 100s, underlying atrial fibrillation  ASSESSMENT AND PLAN: 59 yo with history of ischemic cardiomyopathy with chronic systolic CHF, chronic atrial fibrillation, CAD, CKD IV was admitted with hematochezia and developed acute/chronic systolic CHF.  1. Acute on chronic systolic CHF: Ischemic cardiomyopathy. Deemed not transplant or VAD candidate with comorbities and advanced CKD. CRT-D St Jude. He remains volume overloaded but diuresed well again yesterday.  BP soft so off hydralazine/nitrates.  Creatinine down. - Still needs volume off, would continue Lasix 120 mg IV bid today.  - Continue Toprol XL 12.5 daily. - Hold hydralazine/nitrates with soft BP.  2. Atrial fibrillation: Chronic. CHADS2Vasc Score 5. Off ranexa and amiodarone due to persistency of atrial  fibrillation. Had traumatic SAH in 1/16 but stable back on Eliquis. Admitted with hematochezia but now back on Eliquis.  - Continue Toprol XL 12.5 daily and Eliquis.  - Only 48% BiV pacing with atrial fibrillation and difficult controlling rate with soft BP.  I think that our only good option for rate control and restoration of BiV pacing is going to be AV nodal ablation.  Discussed with EP, plan for this morning. 3. CAD: NSTEMI earlier this year. Stress test following NSTEMI without high ischemic burden. No cath due to renal failure.  No chest pain. - Continue medical therapy. He is not on ASA due to Eliquis use.  - Restarted statin, Crestor at 10 mg daily due to renal disease.  4. CKD: Creatinine coming down with diuresis, 3.38 this morning.  Plan for AV fistula placement eventually.   5. Lower GI bleeding: H/O rectal cancer 2008, has diverting colostomy. He had bleeding this admission from stoma which has stopped, Eliquis restarted.  No further overt bleeding, hemoglobin stable.   Loralie Champagne 08/28/2015 7:34 AM

## 2015-08-28 NOTE — Progress Notes (Signed)
PROGRESS NOTE  [name] RCV:[id number] DOB: 12-20-1955 DOA: 08/23/2015 PCP: Glo Herring., MD   59 year old with h/o hypertension, CAD, Chronic systolic heart failure, paroxysmal atrial fibrillation on eliquis, adenocarcinoma of the the rectum, s/p colostomy, ischemic cardiomyopathy, type 2 DM, TIA, stage 4 CKD, admitted for bleeding in to the colostomy site since last night. Currently no blood in the colostomy bag.  Colonoscopy was done with source of bleeding found.  He was also found to have worsening renal failure secondary to volume overload.  Renal was consulted   Assessment/Plan: Bleeding from colostomy stoma None further   Chronic hypotension -Baseline blood pressure around 80/60 -Current blood pressure stable   Anemia of chronic disease/acute blood loss anemia -stable post transfusion   Ischemic cardiomyopathy-EF 35% 12/07/56/ Chronic systolic heart failure -lasix per renal and cardiology   Paroxysmal atrial fibrillation/Long term current use of anticoagulant therapy -eliquis resumed. S/p AVN ablation   CAD with LCX disease and stents to LAD stable   Chronic kidney disease, stage IV (severe) -BUN slightly elevated and likely related to current bleeding? nephro following vascular for vein mapping   DM type 2, uncontrolled, with renal complications - SSI - 20 units daily   Chronically elevated transaminase level trend   GERD  -Continue PPI   OSA on CPAP -Provide nocturnal CPAP   Hyperlipidemia -Crestor on hold   H/O ventricular fibrillation-March 2016/BiV ICD (St Jude) gen change 04/10/15  Hypokalemia -replace  Cough: CXR without pna. Add mucinex dm  Code Status: full Family Communication: patient/family Disposition Plan: home when stable   Consultants:  GI  Surgery  neprho  Procedures:      HPI/Subjective: C/o cough unrelieved by [email or web address]  Objective: Filed Vitals:   08/28/15 1649  BP: 81/56  Pulse:    Temp: 97.4 F (36.3 C)  Resp:     Intake/Output Summary (Last 24 hours) at 08/28/15 1659 Last data filed at 08/28/15 1653  Gross per 24 hour  Intake    480 ml  Output   3825 ml  Net  -3345 ml   Filed Weights   08/26/15 0513 08/27/15 0457 08/28/15 0522  Weight: 100.472 kg (221 lb 8 oz) 98.385 kg (216 lb 14.4 oz) 96.843 kg (213 lb 8 oz)    Exam:   General:  Awake, NAD  Cardiovascular: rrr  Respiratory: clear  Abdomen: +BS, soft  Musculoskeletal: +edema LE  Data Reviewed: Basic Metabolic Panel:  Recent Labs Lab 08/24/15 0336 08/25/15 1410 08/26/15 0428 08/27/15 0406 08/28/15 0444  NA 139 130* 135 134* 138  K 4.2 5.0 4.5 3.0* 3.4*  CL 108 100* 102 101 105  CO2 14* 17* 23 23 24   GLUCOSE 116* 148* 98 78 96  BUN 45* 60* 62* 57* 53*  CREATININE 3.53* 4.17* 4.06* 3.57* 3.38*  CALCIUM 9.4 9.2 9.1  8.6 8.5* 8.6*  PHOS  --   --  5.0* 3.8  --    Liver Function Tests:  Recent Labs Lab 08/26/15 0428 08/27/15 0406  ALBUMIN 2.7* 2.7*   No results for input(s): LIPASE, AMYLASE in the last 168 hours. No results for input(s): AMMONIA in the last 168 hours. CBC:  Recent Labs Lab 08/23/15 0230 08/23/15 0556 08/23/15 1356 08/24/15 0336 08/27/15 0810 08/28/15 0444  WBC 7.1 7.1 5.4 7.4 5.5 6.0  NEUTROABS 4.9  --  3.8  --   --   --   HGB 10.6* 9.0* 10.7* 12.4* 10.2* 9.8*  HCT 33.0* 27.9* 32.5* 38.1* 30.0* 29.0*  MCV 100.3* 99.6 97.0 99.7 97.7 97.3  PLT 151 129* 126* 146* 114* 103*   Cardiac Enzymes: No results for input(s): CKTOTAL, CKMB, CKMBINDEX, TROPONINI in the last 168 hours. BNP (last 3 results)  Recent Labs  04/23/15 0503 07/21/15 0947 08/02/15 2129  BNP 1218.9* 781.6* 1004.6*    ProBNP (last 3 results)  Recent Labs  09/27/14 0523 11/19/14 0745  PROBNP 9164.0* 5233.0*    CBG:  Recent Labs Lab 08/27/15 1123 08/27/15 1613 08/27/15 2003 08/28/15 0751 08/28/15 1128  GLUCAP 130* 111* 230* 97 83    No results found for this or  any previous visit (from the past 240 hour(s)).   Studies: No results found.  Scheduled Meds: . apixaban  5 mg Oral BID  . furosemide  120 mg Intravenous BID  . insulin aspart  0-15 Units Subcutaneous TID WC  . insulin aspart  0-5 Units Subcutaneous QHS  . insulin glargine  20 Units Subcutaneous Q24H  . metoprolol succinate  12.5 mg Oral BID  . pantoprazole  40 mg Oral Daily  . polyethylene glycol  17 g Oral BID  . rosuvastatin  10 mg Oral q1800  . sodium bicarbonate  650 mg Oral TID  . sodium chloride  3 mL Intravenous Q12H  . sodium chloride  3 mL Intravenous Q12H   Continuous Infusions:  Antibiotics Given (last 72 hours)    None      Principal Problem:   Bleeding from colostomy stoma Active Problems:   Ischemic cardiomyopathy-EF 35% 04/24/15 echo   Long term current use of anticoagulant therapy   CAD with LCX disease and stents to LAD   Chronic systolic heart failure   Paroxysmal atrial fibrillation   Chronic hypotension   Chronic kidney disease, stage IV (severe)   DM type 2, uncontrolled, with renal complications   GERD (gastroesophageal reflux disease)   OSA on CPAP   Hyperlipidemia   H/O ventricular fibrillation-March 2016   BiV ICD (St Jude) gen change 04/10/15   Chronically elevated transaminase level-Amiodarone resumed 04/24/15   Constipation   Anemia of chronic disease   Chronic atrial fibrillation    Time spent: 25 min    [place] L  [facility] [email or web address], password [facility] 08/28/2015, 4:59 PM  LOS: 5 days

## 2015-08-29 ENCOUNTER — Other Ambulatory Visit: Payer: Medicare Other

## 2015-08-29 ENCOUNTER — Ambulatory Visit: Payer: Medicare Other | Admitting: Nurse Practitioner

## 2015-08-29 ENCOUNTER — Encounter (HOSPITAL_COMMUNITY): Payer: Self-pay | Admitting: Cardiology

## 2015-08-29 DIAGNOSIS — I482 Chronic atrial fibrillation: Secondary | ICD-10-CM

## 2015-08-29 LAB — BASIC METABOLIC PANEL
Anion gap: 12 (ref 5–15)
BUN: 49 mg/dL — AB (ref 6–20)
CHLORIDE: 100 mmol/L — AB (ref 101–111)
CO2: 24 mmol/L (ref 22–32)
Calcium: 8.9 mg/dL (ref 8.9–10.3)
Creatinine, Ser: 2.92 mg/dL — ABNORMAL HIGH (ref 0.61–1.24)
GFR, EST AFRICAN AMERICAN: 25 mL/min — AB (ref >60)
GFR, EST NON AFRICAN AMERICAN: 22 mL/min — AB (ref >60)
Glucose, Bld: 238 mg/dL — ABNORMAL HIGH (ref 65–99)
POTASSIUM: 3.5 mmol/L (ref 3.5–5.1)
SODIUM: 136 mmol/L (ref 135–145)

## 2015-08-29 LAB — GLUCOSE, CAPILLARY
GLUCOSE-CAPILLARY: 90 mg/dL (ref 65–99)
GLUCOSE-CAPILLARY: 112 mg/dL — AB (ref 65–99)
GLUCOSE-CAPILLARY: 114 mg/dL — AB (ref 65–99)
GLUCOSE-CAPILLARY: 235 mg/dL — AB (ref 65–99)
Glucose-Capillary: 183 mg/dL — ABNORMAL HIGH (ref 65–99)

## 2015-08-29 MED ORDER — DIPHENHYDRAMINE HCL 25 MG PO CAPS
50.0000 mg | ORAL_CAPSULE | Freq: Every day | ORAL | Status: DC
  Administered 2015-08-29: 50 mg via ORAL
  Filled 2015-08-29: qty 2

## 2015-08-29 MED ORDER — POTASSIUM CHLORIDE CRYS ER 20 MEQ PO TBCR
40.0000 meq | EXTENDED_RELEASE_TABLET | Freq: Once | ORAL | Status: AC
  Administered 2015-08-29: 40 meq via ORAL
  Filled 2015-08-29: qty 2

## 2015-08-29 NOTE — Progress Notes (Signed)
PROGRESS NOTE  [name] JOA:[id number] DOB: [birth year] DOA: 08/23/2015 PCP: Glo Herring., MD   59 year old with h/o hypertension, CAD, Chronic systolic heart failure, paroxysmal atrial fibrillation on eliquis, adenocarcinoma of the the rectum, s/p colostomy, ischemic cardiomyopathy, type 2 DM, TIA, stage 4 CKD, admitted for bleeding in to the colostomy site since last night. Currently no blood in the colostomy bag.  Colonoscopy was done with source of bleeding found.  He was also found to have worsening renal failure secondary to volume overload.  Renal was consulted   Assessment/Plan: Bleeding from colostomy stoma None further   Chronic hypotension -Baseline blood pressure around 80/60 -Current blood pressure stable   Anemia of chronic disease/acute blood loss anemia -stable post transfusion   Ischemic cardiomyopathy-EF 35% 05/06/08/ Chronic systolic heart failure -lasix per renal and cardiology   Paroxysmal atrial fibrillation/Long term current use of anticoagulant therapy -eliquis resumed. S/p AVN ablation   CAD with LCX disease and stents to LAD stable   Chronic kidney disease, stage IV (severe) Creatinine improving vascular for vein mapping   DM type 2, uncontrolled, with renal complications - SSI - 20 units daily   Chronically elevated transaminase level trend   GERD  -Continue PPI   OSA on CPAP -Provide nocturnal CPAP   Hyperlipidemia -Crestor on hold   H/O ventricular fibrillation-March 2016/BiV ICD (St Jude) gen change 04/10/15  Hypokalemia -replace  Cough: CXR without pna. Add mucinex dm  Insomnia: requests benadryl qhs  Code Status: full Family Communication: patient Disposition Plan: home when stable   Consultants:  GI  Surgery  neprho  Procedures:      HPI/Subjective: Coughed slightly improved. Complaining of insomnia. Takes Tylenol PM at home. No dyspnea. Swelling slowly improving.  Objective: Filed  Vitals:   08/29/15 0855  BP: 91/60  Pulse:   Temp:   Resp:     Intake/Output Summary (Last 24 hours) at 08/29/15 1023 Last data filed at 08/29/15 0913  Gross per 24 hour  Intake    120 ml  Output   2600 ml  Net  -2480 ml   Filed Weights   08/27/15 0457 08/28/15 0522 08/29/15 0504  Weight: 98.385 kg (216 lb 14.4 oz) 96.843 kg (213 lb 8 oz) 96.798 kg (213 lb 6.4 oz)    Exam:   General:  Awake groggy. Oriented and appropriate.  Cardiovascular: rrr  Respiratory: clear  Abdomen: +BS, soft  Musculoskeletal: +edema LE  Data Reviewed: Basic Metabolic Panel:  Recent Labs Lab 08/25/15 1410 08/26/15 0428 08/27/15 0406 08/28/15 0444 08/29/15 0442  NA 130* 135 134* 138 136  K 5.0 4.5 3.0* 3.4* 3.5  CL 100* 102 101 105 100*  CO2 17* 23 23 24 24   GLUCOSE 148* 98 78 96 238*  BUN 60* 62* 57* 53* 49*  CREATININE 4.17* 4.06* 3.57* 3.38* 2.92*  CALCIUM 9.2 9.1  8.6 8.5* 8.6* 8.9  PHOS  --  5.0* 3.8  --   --    Liver Function Tests:  Recent Labs Lab 08/26/15 0428 08/27/15 0406  ALBUMIN 2.7* 2.7*   No results for input(s): LIPASE, AMYLASE in the last 168 hours. No results for input(s): AMMONIA in the last 168 hours. CBC:  Recent Labs Lab 08/23/15 0230 08/23/15 0556 08/23/15 1356 08/24/15 0336 08/27/15 0810 08/28/15 0444  WBC 7.1 7.1 5.4 7.4 5.5 6.0  NEUTROABS 4.9  --  3.8  --   --   --   HGB 10.6* 9.0* 10.7* 12.4* 10.2* 9.8*  HCT 33.0* 27.9* 32.5* 38.1* 30.0* 29.0*  MCV 100.3* 99.6 97.0 99.7 97.7 97.3  PLT 151 129* 126* 146* 114* 103*   Cardiac Enzymes: No results for input(s): CKTOTAL, CKMB, CKMBINDEX, TROPONINI in the last 168 hours. BNP (last 3 results)  Recent Labs  04/23/15 0503 07/21/15 0947 08/02/15 2129  BNP 1218.9* 781.6* 1004.6*    ProBNP (last 3 results)  Recent Labs  09/27/14 0523 11/19/14 0745  PROBNP 9164.0* 5233.0*    CBG:  Recent Labs Lab 08/28/15 1128 08/28/15 1603 08/28/15 1702 08/28/15 2105 08/29/15 0737    GLUCAP 83 90 90 156* 183*    No results found for this or any previous visit (from the past 240 hour(s)).   Studies: No results found.  Scheduled Meds: . apixaban  5 mg Oral BID  . dextromethorphan-guaiFENesin  1 tablet Oral BID  . furosemide  120 mg Intravenous BID  . insulin aspart  0-15 Units Subcutaneous TID WC  . insulin aspart  0-5 Units Subcutaneous QHS  . insulin glargine  20 Units Subcutaneous Q24H  . metoprolol succinate  12.5 mg Oral BID  . pantoprazole  40 mg Oral Daily  . polyethylene glycol  17 g Oral BID  . rosuvastatin  10 mg Oral q1800  . sodium bicarbonate  650 mg Oral TID  . sodium chloride  3 mL Intravenous Q12H  . sodium chloride  3 mL Intravenous Q12H   Continuous Infusions:  Antibiotics Given (last 72 hours)    None      Principal Problem:   Bleeding from colostomy stoma Active Problems:   Ischemic cardiomyopathy-EF 35% 04/24/15 echo   Long term current use of anticoagulant therapy   CAD with LCX disease and stents to LAD   Chronic systolic heart failure   Paroxysmal atrial fibrillation   Chronic hypotension   Chronic kidney disease, stage IV (severe)   DM type 2, uncontrolled, with renal complications   GERD (gastroesophageal reflux disease)   OSA on CPAP   Hyperlipidemia   H/O ventricular fibrillation-March 2016   BiV ICD (St Jude) gen change 04/10/15   Chronically elevated transaminase level-Amiodarone resumed 04/24/15   Constipation   Anemia of chronic disease   Chronic atrial fibrillation  Time spent: 25 min  [place] L  [facility] [email or web address], password [facility] 08/29/2015, 10:22 AM  LOS: 6 days

## 2015-08-29 NOTE — Progress Notes (Signed)
[facility] Progress Note    Assessment/ Plan:   1. Likely Progression of CKD: Cr continues to improve, 3.57>3.38>2.92. He is diuresing well on Lasix 120 mg IV BID with 2,650 ml urine output. Weight stable. Cards wants to continue Lasix 120 mg IV BID as still has significant fluid on and transition to orals over weekend. Agree with this plan. Appreciate vascular surgery's consultation for fistula placement, likely to have fistula on right. RUE vein mapping completed.  2. Hypokalemia: K 3.5 this morning. Received K-Dur 40 mEq yesterday.  3. Anemia of Chronic Disease: Hgb stable at 9.8. No evidence of bleeding. Will continue to monitor.  4. Atrial Fibrillation: CHADS-VASc 5. Patient underwent AV nodal ablation yesterday, biventricular pacing 100% now. On Eliquis.   Subjective:   No acute events overnight. Patient denies SOB. Diuresing well.    Objective:   BP 92/62 mmHg  Pulse 84  Temp(Src) 98.1 F (36.7 C) (Axillary)  Resp 18  Ht 5\' 10"  (1.778 m)  Wt 213 lb 6.4 oz (96.798 kg)  BMI 30.62 kg/m2  SpO2 100%  Intake/Output Summary (Last 24 hours) at 08/29/15 0749 Last data filed at 08/29/15 0651  Gross per 24 hour  Intake      0 ml  Output   2650 ml  Net  -2650 ml   Weight change: -1.6 oz (-0.045 kg)  Physical Exam: Gen: alert, sitting up in bed eating breakfast, NAD CVS: RRR, no m/g/r Resp: CTA bilaterally, no crackles appreciated Abd: BS+, soft, non-tender Ext: 2+ pitting edema to the mid-thighs bilaterally  Imaging: No results found.  Labs: BMET  Recent Labs Lab 08/23/15 0215 08/24/15 0336 08/25/15 1410 08/26/15 0428 08/27/15 0406 08/28/15 0444 08/29/15 0442  NA 140 139 130* 135 134* 138 136  K 4.1 4.2 5.0 4.5 3.0* 3.4* 3.5  CL 106 108 100* 102 101 105 100*  CO2  --  14* 17* 23 23 24 24   GLUCOSE 127* 116* 148* 98 78 96 238*  BUN 48* 45* 60* 62* 57* 53* 49*  CREATININE 4.00* 3.53* 4.17* 4.06* 3.57* 3.38* 2.92*  CALCIUM  --  9.4 9.2 9.1  8.6  8.5* 8.6* 8.9  PHOS  --   --   --  5.0* 3.8  --   --    CBC  Recent Labs Lab 08/23/15 0230  08/23/15 1356 08/24/15 0336 08/27/15 0810 08/28/15 0444  WBC 7.1  < > 5.4 7.4 5.5 6.0  NEUTROABS 4.9  --  3.8  --   --   --   HGB 10.6*  < > 10.7* 12.4* 10.2* 9.8*  HCT 33.0*  < > 32.5* 38.1* 30.0* 29.0*  MCV 100.3*  < > 97.0 99.7 97.7 97.3  PLT 151  < > 126* 146* 114* 103*  < > = values in this interval not displayed.  Medications:    . apixaban  5 mg Oral BID  . dextromethorphan-guaiFENesin  1 tablet Oral BID  . furosemide  120 mg Intravenous BID  . insulin aspart  0-15 Units Subcutaneous TID WC  . insulin aspart  0-5 Units Subcutaneous QHS  . insulin glargine  20 Units Subcutaneous Q24H  . metoprolol succinate  12.5 mg Oral BID  . pantoprazole  40 mg Oral Daily  . polyethylene glycol  17 g Oral BID  . rosuvastatin  10 mg Oral q1800  . sodium bicarbonate  650 mg Oral TID  . sodium chloride  3 mL Intravenous Q12H  . sodium chloride  3  mL Intravenous Q12H      [name], MD, MPH IMTS- PGY-2  08/29/2015, 7:49 AM

## 2015-08-29 NOTE — Progress Notes (Signed)
Patient ID: [name], male   DOB: 1956/11/06, 59 y.o.   MRN: [id number]   SUBJECTIVE: AV nodal ablation yesterday, now BiV pacing 100%.   He feels better today.  Continuing to diurese, creatinine down to 2.92.    Scheduled Meds: . apixaban  5 mg Oral BID  . dextromethorphan-guaiFENesin  1 tablet Oral BID  . furosemide  120 mg Intravenous BID  . insulin aspart  0-15 Units Subcutaneous TID WC  . insulin aspart  0-5 Units Subcutaneous QHS  . insulin glargine  20 Units Subcutaneous Q24H  . metoprolol succinate  12.5 mg Oral BID  . pantoprazole  40 mg Oral Daily  . polyethylene glycol  17 g Oral BID  . potassium chloride  40 mEq Oral Once  . rosuvastatin  10 mg Oral q1800  . sodium bicarbonate  650 mg Oral TID  . sodium chloride  3 mL Intravenous Q12H  . sodium chloride  3 mL Intravenous Q12H   Continuous Infusions:  PRN Meds:.sodium chloride, acetaminophen **OR** acetaminophen, albuterol, fluticasone, ondansetron **OR** ondansetron (ZOFRAN) IV, sodium chloride    Filed Vitals:   08/28/15 2224 08/29/15 0032 08/29/15 0504 08/29/15 0644  BP: 92/64 89/61  92/62  Pulse:      Temp:   97.5 F (36.4 C) 98.1 F (36.7 C)  TempSrc:   Oral Axillary  Resp:    18  Height:      Weight:   213 lb 6.4 oz (96.798 kg)   SpO2:    100%    Intake/Output Summary (Last 24 hours) at 08/29/15 0804 Last data filed at 08/29/15 0651  Gross per 24 hour  Intake      0 ml  Output   2650 ml  Net  -2650 ml    LABS: Basic Metabolic Panel:  Recent Labs  08/27/15 0406 08/28/15 0444 08/29/15 0442  NA 134* 138 136  K 3.0* 3.4* 3.5  CL 101 105 100*  CO2 23 24 24   GLUCOSE 78 96 238*  BUN 57* 53* 49*  CREATININE 3.57* 3.38* 2.92*  CALCIUM 8.5* 8.6* 8.9  PHOS 3.8  --   --    Liver Function Tests:  Recent Labs  08/27/15 0406  ALBUMIN 2.7*   No results for input(s): LIPASE, AMYLASE in the last 72 hours. CBC:  Recent Labs  08/27/15 0810 08/28/15 0444  WBC 5.5 6.0  HGB 10.2* 9.8*    HCT 30.0* 29.0*  MCV 97.7 97.3  PLT 114* 103*   Cardiac Enzymes: No results for input(s): CKTOTAL, CKMB, CKMBINDEX, TROPONINI in the last 72 hours. BNP: Invalid input(s): POCBNP D-Dimer: No results for input(s): DDIMER in the last 72 hours. Hemoglobin A1C: No results for input(s): HGBA1C in the last 72 hours. Fasting Lipid Panel: No results for input(s): CHOL, HDL, LDLCALC, TRIG, CHOLHDL, LDLDIRECT in the last 72 hours. Thyroid Function Tests: No results for input(s): TSH, T4TOTAL, T3FREE, THYROIDAB in the last 72 hours.  Invalid input(s): FREET3 Anemia Panel: No results for input(s): VITAMINB12, FOLATE, FERRITIN, TIBC, IRON, RETICCTPCT in the last 72 hours.  RADIOLOGY: Ct Abdomen Pelvis Wo Contrast  07/31/2015   CLINICAL DATA:  Followup rectal carcinoma. Previous surgery, chemotherapy, and radiation therapy. Personal history of prostate carcinoma.  EXAM: CT ABDOMEN AND PELVIS WITHOUT CONTRAST  TECHNIQUE: Multidetector CT imaging of the abdomen and pelvis was performed following the standard protocol without IV contrast.  COMPARISON:  04/05/2015  FINDINGS: Lower chest: Small bilateral pleural effusions show no significant change.  Hepatobiliary: No  mass visualized on this unenhanced exam. Tiny calcified gallstone again noted, without evidence of cholecystitis or biliary ductal dilatation.  Pancreas: No mass or inflammatory process visualized on this unenhanced exam.  Spleen:  Within normal limits in size.  Adrenal Glands:  No masses identified.  Kidneys/Urinary tract: No evidence of urolithiasis or hydronephrosis.  Stomach/Bowel/Peritoneum: Left lower quadrant colostomy again demonstrated. Midline presacral soft tissue density is stable an likely due to post treatment change. There is an asymmetric ill-defined soft tissue density in the right perirectal region. This shows mild increase in size since previous study, currently measuring 2.8 x 2.8 cm on image 79, compared to 2.3 x 2.6 cm  previously. This is suspicious for recurrent carcinoma.  Vascular/Lymphatic: No pathologically enlarged lymph nodes identified. No abdominal aortic aneurysm or other significant retroperitoneal abnormality demonstrated.  Reproductive:  Brachytherapy seeds again noted in the prostate.  Other:  None.  Musculoskeletal:  No suspicious bone lesions identified.  IMPRESSION: Mild increase in size of asymmetric right perirectal soft tissue density, suspicious for recurrent carcinoma.  Stable midline presacral soft tissue density, consistent with post treatment change.  Cholelithiasis.  No radiographic evidence of cholecystitis.  Stable small bilateral pleural effusions.   Electronically Signed   By: Earle Gell M.D.   On: 07/31/2015 10:56   Dg Chest 2 View  08/02/2015   CLINICAL DATA:  Shortness of breath for 2-3 days. Intermittent. Chest pain.  EXAM: CHEST  2 VIEW  COMPARISON:  07/21/2015  FINDINGS: Multi lead left-sided pacemaker remains in place. Cardiomegaly is unchanged. Improving right upper lobe patchy opacity with minimal residual ill-defined opacity in the perihilar region. The lungs are hyperinflated. No new consolidation. No pleural effusion or pneumothorax. No acute osseous abnormalities are seen.  IMPRESSION: 1. Resolving right upper lobe opacity consistent with improving pneumonia. Minimal residual perihilar opacity persists. Recommend additional follow-up in 2-3 weeks to ensure or complete clearing. 2. Chronic cardiomegaly.  No acute process.   Electronically Signed   By: Jeb Levering M.D.   On: 08/02/2015 23:07   Dg Abd Acute W/chest  08/23/2015   CLINICAL DATA:  GI bleed.  Abdominal distension.  EXAM: DG ABDOMEN ACUTE W/ 1V CHEST  COMPARISON:  CT abdomen/ pelvis 07/31/2015 chest radiographs 08/02/2015  FINDINGS: Multi lead left-sided pacemaker in place. Cardiomegaly is unchanged. Previous right upper lobe opacity has resolved. The lungs are clear.  There is no free intra-abdominal air. No dilated  bowel loops to suggest obstruction. Large volume of stool in the right colon, moderate stool throughout the remainder the colon. The left lower quadrant ostomy is not well seen. No radiopaque calculi. No acute osseous abnormalities are seen. No radiopaque calculi. There are vascular calcifications. No acute osseous abnormalities are seen.  IMPRESSION: 1. Large volume of colonic stool. No bowel dilatation to suggest obstruction. 2. Resolved right upper lobe opacity.  No acute pulmonary process.   Electronically Signed   By: Jeb Levering M.D.   On: 08/23/2015 03:12    PHYSICAL EXAM General: NAD Neck: JVP 12 cm, no thyromegaly or thyroid nodule.  Lungs: Clear to auscultation bilaterally with normal respiratory effort. CV: Nondisplaced PMI.  Heart tachy, fairly regular S1/S2, no S3/S4, no murmur.  1+ edema to knees.   Abdomen: Soft, nontender, no hepatosplenomegaly, no distention.  Neurologic: Alert and oriented x 3.  Psych: Normal affect. Extremities: No clubbing or cyanosis.   TELEMETRY: Reviewed telemetry, BiV pacing in 80s with underlying atrial fibrillation  ASSESSMENT AND PLAN: 58 yo with history of  ischemic cardiomyopathy with chronic systolic CHF, chronic atrial fibrillation, CAD, CKD IV was admitted with hematochezia and developed acute/chronic systolic CHF.  1. Acute on chronic systolic CHF: Ischemic cardiomyopathy. Deemed not transplant or VAD candidate with comorbities and advanced CKD. CRT-D St Jude. Now BiV pacing 100% of time post AV nodal ablation yesterday.  He remains volume overloaded but diuresed well again yesterday.  Creatinine down. - Still needs volume off, would continue Lasix 120 mg IV bid today => probably transition to Lasix 80 mg po bid over weekend for home.  - Continue Toprol XL 12.5 bid. - BP too soft to push further neurohormonal blockade at this point.  2. Atrial fibrillation: Chronic. CHADS2Vasc Score 5. Off ranexa and amiodarone due to persistency of atrial  fibrillation. Had traumatic SAH in 1/16 but stable back on Eliquis. Admitted with hematochezia from stoma but now back on Eliquis.  - Continue Eliquis, no further bleeding.  - Only 48% BiV pacing with atrial fibrillation and difficult controlling rate with soft BP this admission.  Therefore, he had AV nodal ablation yesterday.  Now BiV pacing 100% of time in 80s, feels better. 3. CAD: NSTEMI earlier this year. Stress test following NSTEMI without high ischemic burden. No cath due to renal failure.  No chest pain. - Continue medical therapy. He is not on ASA due to Eliquis use.  - Restarted statin, Crestor at 10 mg daily due to renal disease.  4. CKD: Creatinine coming down with diuresis, 2.95 this morning.  Plan for AV fistula placement eventually.   5. Lower GI bleeding: H/O rectal cancer 2008, has diverting colostomy. He had bleeding this admission from stoma which has stopped, Eliquis restarted.  No further overt bleeding, hemoglobin stable.   Loralie Champagne 08/29/2015 8:04 AM

## 2015-08-29 NOTE — Progress Notes (Signed)
Patient has refused CPAP for tonight. RT will continue to monitor as needed.  

## 2015-08-29 NOTE — Consult Note (Signed)
[facility]  Subjective  - feels ok   Objective 91/60 84 98.1 F (36.7 C) (Axillary) 18 100%  Intake/Output Summary (Last 24 hours) at 08/29/15 1148 Last data filed at 08/29/15 0913  Gross per 24 hour  Intake    120 ml  Output   2600 ml  Net  -2480 ml   2+ brachial pulse right side  Assessment/Planning: Pt with left side AICD Has good upper arm cephalic on right Will schedule for right arm av fistula in a few weeks as outpt  Ruta Hinds 08/29/2015 11:48 AM --  Laboratory Lab Results:  Recent Labs  08/27/15 0810 08/28/15 0444  WBC 5.5 6.0  HGB 10.2* 9.8*  HCT 30.0* 29.0*  PLT 114* 103*   BMET  Recent Labs  08/28/15 0444 08/29/15 0442  NA 138 136  K 3.4* 3.5  CL 105 100*  CO2 24 24  GLUCOSE 96 238*  BUN 53* 49*  CREATININE 3.38* 2.92*  CALCIUM 8.6* 8.9    COAG Lab Results  Component Value Date   INR 1.82* 08/23/2015   INR 1.19 04/08/2015   INR 1.53* 04/04/2015   No results found for: PTT

## 2015-08-29 NOTE — Progress Notes (Signed)
SUBJECTIVE: The patient is significantly improved following AVN ablation  CURRENT MEDICATIONS: . apixaban  5 mg Oral BID  . dextromethorphan-guaiFENesin  1 tablet Oral BID  . furosemide  120 mg Intravenous BID  . insulin aspart  0-15 Units Subcutaneous TID WC  . insulin aspart  0-5 Units Subcutaneous QHS  . insulin glargine  20 Units Subcutaneous Q24H  . metoprolol succinate  12.5 mg Oral BID  . pantoprazole  40 mg Oral Daily  . polyethylene glycol  17 g Oral BID  . potassium chloride  40 mEq Oral Once  . rosuvastatin  10 mg Oral q1800  . sodium bicarbonate  650 mg Oral TID  . sodium chloride  3 mL Intravenous Q12H  . sodium chloride  3 mL Intravenous Q12H      OBJECTIVE: Physical Exam: Filed Vitals:   08/28/15 2224 08/29/15 0032 08/29/15 0504 08/29/15 0644  BP: 92/64 89/61  92/62  Pulse:      Temp:   97.5 F (36.4 C) 98.1 F (36.7 C)  TempSrc:   Oral Axillary  Resp:    18  Height:      Weight:   213 lb 6.4 oz (96.798 kg)   SpO2:    100%    Intake/Output Summary (Last 24 hours) at 08/29/15 0826 Last data filed at 08/29/15 4098  Gross per 24 hour  Intake      0 ml  Output   2650 ml  Net  -2650 ml    Telemetry reveals atrial fibrillation with V pacing at 80  GEN- The patient is well appearing, alert and oriented x 3 today.   Head- normocephalic, atraumatic Eyes-  Sclera clear, conjunctiva pink Ears- hearing intact Oropharynx- clear Neck- supple  Lungs- Clear to ausculation bilaterally, normal work of breathing Heart- Regular rate and rhythm (paced)  GI- soft, NT, ND, + BS Extremities- no clubbing, cyanosis, or edema Skin- no rash or lesion Psych- euthymic mood, full affect Neuro- strength and sensation are intact  LABS: Basic Metabolic Panel:  Recent Labs  08/27/15 0406 08/28/15 0444 08/29/15 0442  NA 134* 138 136  K 3.0* 3.4* 3.5  CL 101 105 100*  CO2 23 24 24   GLUCOSE 78 96 238*  BUN 57* 53* 49*  CREATININE 3.57* 3.38* 2.92*  CALCIUM  8.5* 8.6* 8.9  PHOS 3.8  --   --    Liver Function Tests:  Recent Labs  08/27/15 0406  ALBUMIN 2.7*   CBC:  Recent Labs  08/27/15 0810 08/28/15 0444  WBC 5.5 6.0  HGB 10.2* 9.8*  HCT 30.0* 29.0*  MCV 97.7 97.3  PLT 114* 103*    RADIOLOGY: Dg Chest 2 View 08/02/2015   CLINICAL DATA:  Shortness of breath for 2-3 days. Intermittent. Chest pain.  EXAM: CHEST  2 VIEW  COMPARISON:  07/21/2015  FINDINGS: Multi lead left-sided pacemaker remains in place. Cardiomegaly is unchanged. Improving right upper lobe patchy opacity with minimal residual ill-defined opacity in the perihilar region. The lungs are hyperinflated. No new consolidation. No pleural effusion or pneumothorax. No acute osseous abnormalities are seen.  IMPRESSION: 1. Resolving right upper lobe opacity consistent with improving pneumonia. Minimal residual perihilar opacity persists. Recommend additional follow-up in 2-3 weeks to ensure or complete clearing. 2. Chronic cardiomegaly.  No acute process.   Electronically Signed   By: Jeb Levering M.D.   On: 08/02/2015 23:07   ASSESSMENT AND PLAN:  Principal Problem:   Bleeding from colostomy stoma Active Problems:  Ischemic cardiomyopathy-EF 35% 04/24/15 echo   Long term current use of anticoagulant therapy   CAD with LCX disease and stents to LAD   Chronic systolic heart failure   Paroxysmal atrial fibrillation   Chronic hypotension   Chronic kidney disease, stage IV (severe)   DM type 2, uncontrolled, with renal complications   GERD (gastroesophageal reflux disease)   OSA on CPAP   Hyperlipidemia   H/O ventricular fibrillation-March 2016   BiV ICD (St Jude) gen change 04/10/15   Chronically elevated transaminase level-Amiodarone resumed 04/24/15   Constipation   Anemia of chronic disease   Chronic atrial fibrillation    1.  AF with RVR S/p AVN ablation 08/28/15 Will see in office in 2 weeks and decrease pacing rate CHADS2VASC is at least 6 - resume Eliquis  today  2.  ICM No recent ischemic symptoms  3.  Acute on chronic systolic heart failure Continue diuresis per AHF team  4.  CKD Per nephrology  Chanetta Marshall, NP 08/29/2015 8:26 AM   EP Attending  Patient seen and examined. Agree with the history, exam, assessment and plan as noted by Chanetta Marshall, NP-C. The patient has been biv pacing and feels better now that his HR is controlled. He will continue his home meds.  Mikle Bosworth.D.

## 2015-08-29 NOTE — Care Management Important Message (Signed)
Important Message  Patient Details  Name: SON BARKAN MRN: [id number] Date of Birth: 05/05/1956   Medicare Important Message Given:  Yes-third notification given    [name] 08/29/2015, 10:49 AM

## 2015-08-30 LAB — RENAL FUNCTION PANEL
ALBUMIN: 2.6 g/dL — AB (ref 3.5–5.0)
Anion gap: 9 (ref 5–15)
BUN: 46 mg/dL — ABNORMAL HIGH (ref 6–20)
CALCIUM: 8.9 mg/dL (ref 8.9–10.3)
CO2: 27 mmol/L (ref 22–32)
CREATININE: 2.82 mg/dL — AB (ref 0.61–1.24)
Chloride: 102 mmol/L (ref 101–111)
GFR calc non Af Amer: 23 mL/min — ABNORMAL LOW (ref >60)
GFR, EST AFRICAN AMERICAN: 26 mL/min — AB (ref >60)
Glucose, Bld: 122 mg/dL — ABNORMAL HIGH (ref 65–99)
PHOSPHORUS: 3.7 mg/dL (ref 2.5–4.6)
Potassium: 3.5 mmol/L (ref 3.5–5.1)
SODIUM: 138 mmol/L (ref 135–145)

## 2015-08-30 LAB — CBC
HCT: 30.6 % — ABNORMAL LOW (ref 39.0–52.0)
HEMOGLOBIN: 10.2 g/dL — AB (ref 13.0–17.0)
MCH: 33.3 pg (ref 26.0–34.0)
MCHC: 33.3 g/dL (ref 30.0–36.0)
MCV: 100 fL (ref 78.0–100.0)
PLATELETS: 88 10*3/uL — AB (ref 150–400)
RBC: 3.06 MIL/uL — AB (ref 4.22–5.81)
RDW: 16.1 % — ABNORMAL HIGH (ref 11.5–15.5)
WBC: 5.4 10*3/uL (ref 4.0–10.5)

## 2015-08-30 LAB — GLUCOSE, CAPILLARY
GLUCOSE-CAPILLARY: 141 mg/dL — AB (ref 65–99)
Glucose-Capillary: 119 mg/dL — ABNORMAL HIGH (ref 65–99)

## 2015-08-30 MED ORDER — DM-GUAIFENESIN ER 30-600 MG PO TB12: 1.0000 | ORAL_TABLET | Freq: Two times a day (BID) | ORAL | Status: DC | PRN

## 2015-08-30 MED ORDER — POTASSIUM CHLORIDE CRYS ER 20 MEQ PO TBCR: 40.0000 meq | EXTENDED_RELEASE_TABLET | Freq: Once | ORAL | Status: DC

## 2015-08-30 MED ORDER — INSULIN GLARGINE 300 UNIT/ML ~~LOC~~ SOPN: 20.0000 [IU] | PEN_INJECTOR | Freq: Every morning | SUBCUTANEOUS | Status: DC

## 2015-08-30 MED ORDER — FUROSEMIDE 40 MG PO TABS: 80.0000 mg | ORAL_TABLET | Freq: Two times a day (BID) | ORAL | Status: DC

## 2015-08-30 NOTE — Progress Notes (Signed)
Subjective:  He says he feels the best he has felt in some time.  Able to ambulate without shortness of breath.  No bleeding from his colostomy bag.  Asking about discharge.  Objective:  Vital Signs in the last 24 hours: BP 83/56 mmHg  Pulse 81  Temp(Src) 98.4 F (36.9 C) (Oral)  Resp 18  Ht 5\' 10"  (1.778 m)  Wt 96.798 kg (213 lb 6.4 oz)  BMI 30.62 kg/m2  SpO2 98%  Physical Exam: Pleasant tanned male currently in no acute distress Lungs:  Clear  Cardiac:  Regular rhythm, normal S1 and S2, no S3 Abdomen:  Soft, nontender, no masses Extremities:  No edema present  Intake/Output from previous day: 09/23 0701 - 09/24 0700 In: 960 [P.O.:960] Out: 2400 [Urine:2400] Weight Filed Weights   08/28/15 0522 08/29/15 0504 08/30/15 0638  Weight: 96.843 kg (213 lb 8 oz) 96.798 kg (213 lb 6.4 oz) 96.798 kg (213 lb 6.4 oz)    Lab Results: Basic Metabolic Panel:  Recent Labs  08/29/15 0442 08/30/15 0424  NA 136 138  K 3.5 3.5  CL 100* 102  CO2 24 27  GLUCOSE 238* 122*  BUN 49* 46*  CREATININE 2.92* 2.82*    CBC:  Recent Labs  08/28/15 0444 08/30/15 0424  WBC 6.0 5.4  HGB 9.8* 10.2*  HCT 29.0* 30.6*  MCV 97.3 100.0  PLT 103* 88*    BNP    Component Value Date/Time   BNP 1004.6* 08/02/2015 2129    PROTIME: Lab Results  Component Value Date   INR 1.82* 08/23/2015   INR 1.19 04/08/2015   INR 1.53* 04/04/2015    Telemetry: Paced rhythm, and no rapid atrial fibrillation is noted.  Assessment/Plan:  1.  Acute on chronic systolic heart failure with ischemic cardiomyopathy-clinically is much better compensated 2.  Acute on chronic kidney disease with improvement in creatinine with biventricular pacemaking and diuresis 3.  Chronic atrial fibrillation 4.  Lower GI bleeding currently better  Recommendations:  I think it's reasonable for him to go home.  I will have Dr. Haroldine Laws make recommendations on diuretics.  He should follow up in the heart failure clinic  in a couple of weeks.      Kerry Hough  MD [facility] Cardiology  08/30/2015, 8:17 AM

## 2015-08-30 NOTE — Progress Notes (Signed)
Patient refused tonight to have bed alarm on despite education regarding falls and injury from falls in hospital especially at night. [facility] Rn

## 2015-08-30 NOTE — Discharge Summary (Signed)
Physician Discharge Summary  [name] MPN:[id number] DOB: 24-Oct-1956 DOA: 08/23/2015  PCP: Glo Herring., MD  Admit date: 08/23/2015 Discharge date: 08/30/2015  Time spent: greater than 30 minutes  Recommendations for Outpatient Follow-up:  1. Monitor daily weights  Discharge Diagnoses:  Principal Problem:   Bleeding from colostomy stoma Active Problems:   Ischemic cardiomyopathy-EF 35% 04/24/15 echo   Long term current use of anticoagulant therapy   CAD with LCX disease and stents to LAD   Chronic systolic heart failure   Paroxysmal atrial fibrillation   Chronic hypotension   Chronic kidney disease, stage IV (severe)   DM type 2, uncontrolled, with renal complications   GERD (gastroesophageal reflux disease)   OSA on CPAP   Hyperlipidemia   H/O ventricular fibrillation-March 2016   BiV ICD (St Jude) gen change 04/10/15   Chronically elevated transaminase level-Amiodarone resumed 04/24/15   Constipation   Anemia of chronic disease   Chronic atrial fibrillation   Discharge Condition: stable  Diet recommendation: diabetic heart healthy  Filed Weights   08/28/15 0522 08/29/15 0504 08/30/15 0086  Weight: 96.843 kg (213 lb 8 oz) 96.798 kg (213 lb 6.4 oz) 96.798 kg (213 lb 6.4 oz)    History of present illness/Hospital Course:  59 year old with h/o hypertension, CAD, Chronic systolic heart failure, paroxysmal atrial fibrillation on eliquis, adenocarcinoma of the the rectum, s/p APR/colostomy, ischemic cardiomyopathy, type 2 DM, TIA, stage 4 CKD, admitted for bleeding in to the colostomy site since last night. Currently no blood in the colostomy bag.  He was also found to have worsening renal failure secondary to volume overload.   Bleeding from colostomy stoma Colonoscopy showed no bleeding. Source of bleed felt to be external to stoma. Resolved. eliquis resumed. Required transfusion   Chronic hypotension -Baseline blood pressure around 80/60 bidil stopped.     Anemia of chronic disease/acute blood loss anemia -stable post transfusion   Ischemic cardiomyopathy-EF 35% 04/24/15/ acute on Chronic systolic heart failure Felt to be in heart failure. Cardiology and nephrology consulted. Responded to IV diuresis. Cardiology recommended EP consult for AVN ablation   Paroxysmal atrial fibrillation/Long term current use of anticoagulant therapy -eliquis resumed. S/p AVN ablation   CAD with LCX disease and stents to LAD stable   Chronic kidney disease, stage IV (severe) Creatinine improving with improving heart failure vascular consulted and will do AVF within the next few weeks   DM type 2, uncontrolled, with renal complications Continue insulin.   Chronically elevated transaminase level stable   GERD  -Continue PPI   OSA on CPAP -Provide nocturnal CPAP   H/O ventricular fibrillation-March 2016/BiV ICD (St Jude) gen change 04/10/15  Hypokalemia corrected  Cough: CXR without pna. Add mucinex dm  Code Status: full Family Communication: patient Disposition Plan: home when stable   Consultants:  GI  Surgery  neprhology  EP  Procedures:  Colonoscopy  AVN ablation  Discharge Exam: Filed Vitals:   08/30/15 0840  BP: 86/59  Pulse: 80  Temp:   Resp:     General: in chair. A o and comfortable Cardiovascular: RRR Respiratory: CTA Ext 1+ edema  Discharge Instructions   Discharge Instructions    Activity as tolerated - No restrictions    Complete by:  As directed      Diet - low sodium heart healthy    Complete by:  As directed      Diet - low sodium heart healthy    Complete by:  As directed  Diet Carb Modified    Complete by:  As directed           Current Discharge Medication List    START taking these medications   Details  dextromethorphan-guaiFENesin (MUCINEX DM) 30-600 MG per 12 hr tablet Take 1 tablet by mouth 2 (two) times daily as needed for cough.      CONTINUE these medications  which have CHANGED   Details  furosemide (LASIX) 40 MG tablet Take 2 tablets (80 mg total) by mouth 2 (two) times daily. Take an additional pill with an additional potassium pill if you gain over 2 pounds in 24 hours. Qty: 60 tablet, Refills: 1    Insulin Glargine (TOUJEO SOLOSTAR) 300 UNIT/ML SOPN Inject 20 Units into the skin every morning.      CONTINUE these medications which have NOT CHANGED   Details  apixaban (ELIQUIS) 5 MG TABS tablet Take 5 mg by mouth 2 (two) times daily.    co-enzyme Q-10 50 MG capsule Take 50 mg by mouth every morning.     ferrous sulfate 325 (65 FE) MG tablet Take 1 tablet (325 mg total) by mouth 2 (two) times daily with a meal. Qty: 60 tablet, Refills: 0    fluticasone (FLONASE) 50 MCG/ACT nasal spray Place 1 spray into both nostrils daily as needed for allergies.     Omega-3 Fatty Acids (FISH OIL) 1000 MG CAPS Take 1,000 mg by mouth 2 (two) times daily.    pantoprazole (PROTONIX) 40 MG tablet Take 1 tablet (40 mg total) by mouth daily. Qty: 30 tablet, Refills: 0    PROAIR RESPICLICK 884 (90 BASE) MCG/ACT AEPB Inhale 2 puffs into the lungs every 6 (six) hours as needed. Refills: 0    rosuvastatin (CRESTOR) 40 MG tablet Take 40 mg by mouth at bedtime.    metoprolol succinate (TOPROL-XL) 25 MG 24 hr tablet Take 1 tablet (25 mg total) by mouth 2 (two) times daily. Qty: 30 tablet, Refills: 3      STOP taking these medications     BIDIL 20-37.5 MG per tablet      isosorbide dinitrate (ISORDIL) 30 MG tablet      nitroGLYCERIN (NITROLINGUAL) 0.4 MG/SPRAY spray        No Known Allergies Follow-up Information    Follow up with Cristopher Peru, MD On 09/08/2015.   Specialty:  Cardiology   Why:  at 9:15AM   Contact information:   [address] [place] [address] [phone]       Follow up with Loralie Champagne, MD In 1 week.   Specialty:  Cardiology   Contact information:   [address] [place]  [address] [phone]       Follow up with Glo Herring., MD.   Specialty:  Internal Medicine   Why:  1-2 weeks   Contact information:   [address] [place] [address] [phone]       Call Ruta Hinds, MD.   Specialties:  Vascular Surgery, Cardiology   Why:  about dialysis access   Contact information:   [place] [address] [phone]        The results of significant diagnostics from this hospitalization (including imaging, microbiology, ancillary and laboratory) are listed below for reference.    Significant Diagnostic Studies: Dg Chest 2 View  08/02/2015   CLINICAL DATA:  Shortness of breath for 2-3 days. Intermittent. Chest pain.  EXAM: CHEST  2 VIEW  COMPARISON:  07/21/2015  FINDINGS: Multi lead left-sided pacemaker remains in place. Cardiomegaly is unchanged. Improving right upper lobe patchy opacity with minimal residual ill-defined opacity in the perihilar region. The lungs are hyperinflated. No new consolidation. No pleural effusion or pneumothorax. No acute osseous abnormalities are seen.  IMPRESSION: 1. Resolving right upper lobe opacity consistent with improving pneumonia. Minimal residual perihilar opacity persists. Recommend additional follow-up in 2-3 weeks to ensure or complete clearing. 2. Chronic cardiomegaly.  No acute process.   Electronically Signed   By: Jeb Levering M.D.   On: 08/02/2015 23:07   Dg Abd Acute W/chest  08/23/2015   CLINICAL DATA:  GI bleed.  Abdominal distension.  EXAM: DG ABDOMEN ACUTE W/ 1V CHEST  COMPARISON:  CT abdomen/ pelvis 07/31/2015 chest radiographs 08/02/2015  FINDINGS: Multi lead left-sided pacemaker in place. Cardiomegaly is unchanged. Previous right upper lobe opacity has resolved. The lungs are clear.  There is no free intra-abdominal air. No dilated bowel loops to suggest obstruction. Large volume of stool in the right colon, moderate stool throughout the remainder the colon. The left lower  quadrant ostomy is not well seen. No radiopaque calculi. No acute osseous abnormalities are seen. No radiopaque calculi. There are vascular calcifications. No acute osseous abnormalities are seen.  IMPRESSION: 1. Large volume of colonic stool. No bowel dilatation to suggest obstruction. 2. Resolved right upper lobe opacity.  No acute pulmonary process.   Electronically Signed   By: Jeb Levering M.D.   On: 08/23/2015 03:12    Microbiology: No results found for this or any previous visit (from the past 240 hour(s)).   Labs: Basic Metabolic Panel:  Recent Labs Lab 08/26/15 0428 08/27/15 0406 08/28/15 0444 08/29/15 0442 08/30/15 0424  NA 135 134* 138 136 138  K 4.5 3.0* 3.4* 3.5 3.5  CL 102 101 105 100* 102  CO2 23 23 24 24 27   GLUCOSE 98 78 96 238* 122*  BUN 62* 57* 53* 49* 46*  CREATININE 4.06* 3.57* 3.38* 2.92* 2.82*  CALCIUM 9.1  8.6 8.5* 8.6* 8.9 8.9  PHOS 5.0* 3.8  --   --  3.7   Liver Function Tests:  Recent Labs Lab 08/26/15 0428 08/27/15 0406 08/30/15 0424  ALBUMIN 2.7* 2.7* 2.6*   No results for input(s): LIPASE, AMYLASE in the last 168 hours. No results for input(s): AMMONIA in the last 168 hours. CBC:  Recent Labs Lab 08/23/15 1356 08/24/15 0336 08/27/15 0810 08/28/15 0444 08/30/15 0424  WBC 5.4 7.4 5.5 6.0 5.4  NEUTROABS 3.8  --   --   --   --   HGB 10.7* 12.4* 10.2* 9.8* 10.2*  HCT 32.5* 38.1* 30.0* 29.0* 30.6*  MCV 97.0 99.7 97.7 97.3 100.0  PLT 126* 146* 114* 103* 88*   Cardiac Enzymes: No results for input(s): CKTOTAL, CKMB, CKMBINDEX, TROPONINI in the last 168 hours. BNP: BNP (last 3 results)  Recent Labs  04/23/15 0503 07/21/15 0947 08/02/15 2129  BNP 1218.9* 781.6* 1004.6*    ProBNP (last 3 results)  Recent Labs  09/27/14 0523 11/19/14 0745  PROBNP 9164.0* 5233.0*    CBG:  Recent Labs Lab 08/29/15 0737 08/29/15 1133 08/29/15 1658 08/29/15 2136 08/30/15 0733  GLUCAP 183* 112* 114* 235* 119*        Signed:  SULLIVAN,CORINNA L  [facility] 08/30/2015, 11:10 AM

## 2015-09-01 ENCOUNTER — Other Ambulatory Visit: Payer: Self-pay | Admitting: *Deleted

## 2015-09-01 NOTE — Patient Outreach (Signed)
Transition of Care Week #1 attempted Call to patient preferred phone. NO answer, left Voicemail with RNCM contact number. Plan await call from patient, if no return call will continue to attempt to contact for [facility] program services. [name]. [name], RN, BSN, [place] [phone]

## 2015-09-02 ENCOUNTER — Other Ambulatory Visit: Payer: Self-pay | Admitting: *Deleted

## 2015-09-02 ENCOUNTER — Encounter (HOSPITAL_COMMUNITY): Payer: Self-pay

## 2015-09-02 ENCOUNTER — Encounter: Payer: Self-pay | Admitting: Internal Medicine

## 2015-09-02 ENCOUNTER — Emergency Department (HOSPITAL_COMMUNITY)
Admission: EM | Admit: 2015-09-02 | Discharge: 2015-09-02 | Disposition: A | Discharge status: Home / Self Care | Payer: Medicare Other | Attending: Emergency Medicine | Admitting: Emergency Medicine

## 2015-09-02 DIAGNOSIS — Z794 Long term (current) use of insulin: Secondary | ICD-10-CM | POA: Diagnosis not present

## 2015-09-02 DIAGNOSIS — Y838 Other surgical procedures as the cause of abnormal reaction of the patient, or of later complication, without mention of misadventure at the time of the procedure: Secondary | ICD-10-CM | POA: Diagnosis not present

## 2015-09-02 DIAGNOSIS — E785 Hyperlipidemia, unspecified: Secondary | ICD-10-CM | POA: Insufficient documentation

## 2015-09-02 DIAGNOSIS — Z8701 Personal history of pneumonia (recurrent): Secondary | ICD-10-CM | POA: Insufficient documentation

## 2015-09-02 DIAGNOSIS — Z8673 Personal history of transient ischemic attack (TIA), and cerebral infarction without residual deficits: Secondary | ICD-10-CM | POA: Diagnosis not present

## 2015-09-02 DIAGNOSIS — D649 Anemia, unspecified: Secondary | ICD-10-CM | POA: Diagnosis not present

## 2015-09-02 DIAGNOSIS — K219 Gastro-esophageal reflux disease without esophagitis: Secondary | ICD-10-CM | POA: Insufficient documentation

## 2015-09-02 DIAGNOSIS — Z9981 Dependence on supplemental oxygen: Secondary | ICD-10-CM | POA: Insufficient documentation

## 2015-09-02 DIAGNOSIS — K9401 Colostomy hemorrhage: Secondary | ICD-10-CM | POA: Diagnosis not present

## 2015-09-02 DIAGNOSIS — Z85048 Personal history of other malignant neoplasm of rectum, rectosigmoid junction, and anus: Secondary | ICD-10-CM | POA: Diagnosis not present

## 2015-09-02 DIAGNOSIS — I129 Hypertensive chronic kidney disease with stage 1 through stage 4 chronic kidney disease, or unspecified chronic kidney disease: Secondary | ICD-10-CM | POA: Diagnosis not present

## 2015-09-02 DIAGNOSIS — Z95810 Presence of automatic (implantable) cardiac defibrillator: Secondary | ICD-10-CM | POA: Insufficient documentation

## 2015-09-02 DIAGNOSIS — E669 Obesity, unspecified: Secondary | ICD-10-CM | POA: Insufficient documentation

## 2015-09-02 DIAGNOSIS — Z7951 Long term (current) use of inhaled steroids: Secondary | ICD-10-CM | POA: Diagnosis not present

## 2015-09-02 DIAGNOSIS — I5022 Chronic systolic (congestive) heart failure: Secondary | ICD-10-CM | POA: Insufficient documentation

## 2015-09-02 DIAGNOSIS — Z9861 Coronary angioplasty status: Secondary | ICD-10-CM | POA: Insufficient documentation

## 2015-09-02 DIAGNOSIS — I2510 Atherosclerotic heart disease of native coronary artery without angina pectoris: Secondary | ICD-10-CM | POA: Diagnosis not present

## 2015-09-02 DIAGNOSIS — N184 Chronic kidney disease, stage 4 (severe): Secondary | ICD-10-CM | POA: Insufficient documentation

## 2015-09-02 DIAGNOSIS — Z79899 Other long term (current) drug therapy: Secondary | ICD-10-CM | POA: Diagnosis not present

## 2015-09-02 DIAGNOSIS — G4733 Obstructive sleep apnea (adult) (pediatric): Secondary | ICD-10-CM | POA: Diagnosis not present

## 2015-09-02 DIAGNOSIS — S39021A Laceration of muscle, fascia and tendon of abdomen, initial encounter: Secondary | ICD-10-CM | POA: Diagnosis not present

## 2015-09-02 DIAGNOSIS — Z8546 Personal history of malignant neoplasm of prostate: Secondary | ICD-10-CM | POA: Diagnosis not present

## 2015-09-02 LAB — PROTIME-INR
INR: 2.14 — AB (ref 0.00–1.49)
PROTHROMBIN TIME: 23.8 s — AB (ref 11.6–15.2)

## 2015-09-02 LAB — BASIC METABOLIC PANEL
Anion gap: 10 (ref 5–15)
BUN: 38 mg/dL — AB (ref 6–20)
CALCIUM: 8.8 mg/dL — AB (ref 8.9–10.3)
CHLORIDE: 98 mmol/L — AB (ref 101–111)
CO2: 29 mmol/L (ref 22–32)
CREATININE: 2.7 mg/dL — AB (ref 0.61–1.24)
GFR calc non Af Amer: 24 mL/min — ABNORMAL LOW (ref >60)
GFR, EST AFRICAN AMERICAN: 28 mL/min — AB (ref >60)
Glucose, Bld: 148 mg/dL — ABNORMAL HIGH (ref 65–99)
Potassium: 4.1 mmol/L (ref 3.5–5.1)
SODIUM: 137 mmol/L (ref 135–145)

## 2015-09-02 LAB — CBC
HCT: 30.8 % — ABNORMAL LOW (ref 39.0–52.0)
Hemoglobin: 9.9 g/dL — ABNORMAL LOW (ref 13.0–17.0)
MCH: 32.5 pg (ref 26.0–34.0)
MCHC: 32.1 g/dL (ref 30.0–36.0)
MCV: 101 fL — ABNORMAL HIGH (ref 78.0–100.0)
Platelets: 124 10*3/uL — ABNORMAL LOW (ref 150–400)
RBC: 3.05 MIL/uL — ABNORMAL LOW (ref 4.22–5.81)
RDW: 15.4 % (ref 11.5–15.5)
WBC: 7.3 10*3/uL (ref 4.0–10.5)

## 2015-09-02 LAB — TYPE AND SCREEN
ABO/RH(D): O POS
Antibody Screen: NEGATIVE

## 2015-09-02 MED ORDER — SODIUM CHLORIDE 0.9 % IV BOLUS (SEPSIS)
1000.0000 mL | Freq: Once | INTRAVENOUS | Status: AC
  Administered 2015-09-02: 1000 mL via INTRAVENOUS

## 2015-09-02 MED ORDER — LIDOCAINE-EPINEPHRINE (PF) 2 %-1:200000 IJ SOLN
20.0000 mL | Freq: Once | INTRAMUSCULAR | Status: AC
  Administered 2015-09-02: 20 mL
  Filled 2015-09-02: qty 20

## 2015-09-02 NOTE — ED Provider Notes (Signed)
CSN: [id number]     Arrival date & time 09/02/15  0008 History  By signing my name below, I, Irene Pap, attest that this documentation has been prepared under the direction and in the presence of Jola Schmidt, MD. Electronically Signed: Irene Pap, ED Scribe. 09/02/2015. 3:15 AM.  Chief Complaint  Patient presents with  . Bleeding from colostomy    The history is provided by the patient. No language interpreter was used.  HPI Comments: [name] is a 59 y.o. Male with hx of HTN, CAD, AFib, prostate and colon cancer, TIA, HLD, Type II DM, and anemia who presents to the Emergency Department complaining of bleeding from colostomy at the 7 o'clock position onset 2 hours ago. Pt states that the blood filled his colostomy bag and is currently uncontrolled; was seen on 08/23/15 for the same. Wife states that pt recently had a colonoscopy and there was blood seen, but he was told to just continue to monitor it. Pt states that he typically runs a low BP; 90/51 in triage. Pt is on Eliquis.  Past Medical History  Diagnosis Date  . Essential hypertension, benign   . CAD (coronary artery disease)     a. BMS to LAD 2001 at [facility] b. PTCA/atherectomy ramus and BMS to LAD 2009  . Chronic systolic heart failure   . GERD (gastroesophageal reflux disease)   . Paroxysmal atrial fibrillation     a. on amiodarone, digoxin and Eliquis  . Adenocarcinoma of rectum     a. 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 Prostate cancer     a. s/p seed implants with chemo and radiation  . TIA (transient ischemic attack)   . OSA on CPAP   . HLD (hyperlipidemia)   . Orthostatic hypotension   . Dizziness     a. chronic. Admission for this 07/18/2014  . Ischemic cardiomyopathy     EF 18% by nuclear study 2016, multiple myocardial infarctions in past    . Chronic kidney disease, stage IV (severe)   . DM type 2, uncontrolled, with renal complications   . Obesity   . Hematuria   . HCAP (healthcare-associated pneumonia) 07/21/2015  .  History of blood transfusion     "I've had 2 units so far this year" (07/21/2015)  . Anemia    Past Surgical History  Procedure Laterality Date  . Internal defibrillator and pacemaker  2002  . Abdominal and perineal resection of rectum with total mesorectal excision  10/04/2007  . Colonoscopy  09/14/2011    Dr. Gala Romney: via colostomy, Single pedunculated benign inflammatory polyp. Due for surveillance Oct 2015  . Colostomy  2008  . Colonoscopy N/A 07/02/2014    Procedure: COLONOSCOPY;  Surgeon: Daneil Dolin, MD;  [facility];  Service: Endoscopy;  Laterality: N/A;  7:30 / COLONOSCOPY THRU COLOSTOMY  . Esophagogastroduodenoscopy N/A 07/02/2014    Procedure: ESOPHAGOGASTRODUODENOSCOPY (EGD);  Surgeon: Daneil Dolin, MD;  [facility];  Service: Endoscopy;  Laterality: N/A;  7:30  . Savory dilation N/A 07/02/2014    Procedure: SAVORY DILATION;  Surgeon: Daneil Dolin, MD;  [facility];  Service: Endoscopy;  Laterality: N/A;  7:30  . Maloney dilation N/A 07/02/2014    Procedure: Venia Minks DILATION;  Surgeon: Daneil Dolin, MD;  [facility];  Service: Endoscopy;  Laterality: N/A;  7:30  . Portacath placement  06/2007    "removed ~ 1 yr later"  . Left heart catheterization with coronary angiogram N/A 07/13/2013  Procedure: LEFT HEART CATHETERIZATION WITH CORONARY ANGIOGRAM;  Surgeon: Lorretta Harp, MD;  Location: [facility] CATH LAB;  Service: Cardiovascular;  Laterality: N/A;  . Colonoscopy N/A 12/11/2014    Dr. Gala Romney via colostomy. Normal. Repeat in 2021.   Marland Kitchen Esophagogastroduodenoscopy N/A 12/11/2014    ENI:DPOEUM EGD  . Cardiac defibrillator placement  2009    Upgraded to a BiV ICD  . Right heart catheterization N/A 02/24/2015    Procedure: RIGHT HEART CATH;  Surgeon: Jolaine Artist, MD;  Location: [facility] CATH LAB;  Service: Cardiovascular;  Laterality: N/A;  . Ep implantable device N/A 04/10/2015    Procedure: Ppm/Biv Ppm Generator Changeout;  Surgeon: Evans Lance, MD;  Location: [place] INVASIVE CV LAB CUPID;  Service: Cardiovascular;  Laterality: N/A;  . Bi-ventricular implantable cardioverter defibrillator  (crt-d)  2009  . Cardiac catheterization  08/2001; 2009    ; /notes 07/10/2013  . Coronary angioplasty with stent placement  2001; ~ 2006    "1 + 1"   . Givens capsule study N/A 07/23/2015    Procedure: GIVENS CAPSULE STUDY;  Surgeon: Wilford Corner, MD;  Location: [facility] ENDOSCOPY;  Service: Endoscopy;  Laterality: N/A;  . Colonoscopy N/A 08/24/2015    Procedure: COLONOSCOPY;  Surgeon: Manus Gunning, MD;  Location: [place];  Service: Gastroenterology;  Laterality: N/A;  . Electrophysiologic study N/A 08/28/2015    Procedure: AV Node Ablation;  Surgeon: Will Meredith Leeds, MD;  Location: [place] CV LAB;  Service: Cardiovascular;  Laterality: N/A;   Family History  Problem Relation Age of Onset  . Colon cancer Mother 72  . Colon cancer Sister 26  . Coronary artery disease Father   . Colon cancer Other     2 cousins, succumbed to illness   Social History  Substance Use Topics  . Smoking status: Never Smoker   . Smokeless tobacco: Never Used  . Alcohol Use: No     Comment: Former user 45 years ago    Review of Systems  Skin: Positive for wound.  All other systems reviewed and are negative.  Allergies  Review of patient's allergies indicates no known allergies.  Home Medications   Prior to Admission medications   Medication Sig Start Date End Date Taking? Authorizing Provider  apixaban (ELIQUIS) 5 MG TABS tablet Take 5 mg by mouth 2 (two) times daily.    Historical Provider, MD  co-enzyme Q-10 50 MG capsule Take 50 mg by mouth every morning.     Historical Provider, MD  dextromethorphan-guaiFENesin (MUCINEX DM) 30-600 MG per 12 hr tablet Take 1 tablet by mouth 2 (two) times daily as needed for cough. 08/30/15   Delfina Redwood, MD  ferrous sulfate 325 (65 FE) MG tablet Take 1 tablet (325 mg total) by mouth 2 (two) times  daily with a meal. 07/24/15   Shanker Kristeen Mans, MD  fluticasone (FLONASE) 50 MCG/ACT nasal spray Place 1 spray into both nostrils daily as needed for allergies.  11/21/13   Historical Provider, MD  furosemide (LASIX) 40 MG tablet Take 2 tablets (80 mg total) by mouth 2 (two) times daily. Take an additional pill with an additional potassium pill if you gain over 2 pounds in 24 hours. 08/30/15   Delfina Redwood, MD  Insulin Glargine (TOUJEO SOLOSTAR) 300 UNIT/ML SOPN Inject 20 Units into the skin every morning. 08/30/15   Delfina Redwood, MD  metoprolol succinate (TOPROL-XL) 25 MG 24 hr tablet Take 1 tablet (25 mg total) by mouth 2 (  two) times daily. 08/25/15   Larey Dresser, MD  Omega-3 Fatty Acids (FISH OIL) 1000 MG CAPS Take 1,000 mg by mouth 2 (two) times daily.    Historical Provider, MD  pantoprazole (PROTONIX) 40 MG tablet Take 1 tablet (40 mg total) by mouth daily. 07/24/15   Shanker Kristeen Mans, MD  PROAIR RESPICLICK 294 (90 BASE) MCG/ACT AEPB Inhale 2 puffs into the lungs every 6 (six) hours as needed. 08/15/15   Historical Provider, MD  rosuvastatin (CRESTOR) 40 MG tablet Take 40 mg by mouth at bedtime.    Historical Provider, MD   BP 90/51 mmHg  Pulse 81  Temp(Src) 98 F (36.7 C) (Oral)  Resp 16  SpO2 100%  Physical Exam  Constitutional: He is oriented to person, place, and time. He appears well-developed and well-nourished.  HENT:  Head: Normocephalic.  Eyes: EOM are normal.  Neck: Normal range of motion.  Pulmonary/Chest: Effort normal.  Abdominal: He exhibits no distension.  Colostomy of LLQ, small punctate venous bleeding noted at 7 o'clock.   Musculoskeletal: Normal range of motion.  Neurological: He is alert and oriented to person, place, and time.  Psychiatric: He has a normal mood and affect.  Nursing note and vitals reviewed.   ED Course  Procedures (including critical care time)  LACERATION REPAIR Performed by: Hoy Morn Consent: Verbal consent  obtained. Risks and benefits: risks, benefits and alternatives were discussed Patient identity confirmed: provided demographic data Time out performed prior to procedure Prepped and Draped in normal sterile fashion Wound explored Laceration Location:  Laceration Length: 0.2cm No Foreign Bodies seen or palpated Anesthesia: none Irrigation method: syringe Amount of cleaning: standard Skin closure: 4-0 vicryl Number of sutures: 3 Technique: figure 8 Patient tolerance: Patient tolerated the procedure well with no immediate complications.      DIAGNOSTIC STUDIES: Oxygen Saturation is 100% on RA, normal by my interpretation.    COORDINATION OF CARE: 12:56 AM-Discussed treatment plan which includes labs and IV fluids with pt at bedside and pt agreed to plan.   Labs Review Labs Reviewed  CBC - Abnormal; Notable for the following:    RBC 3.05 (*)    Hemoglobin 9.9 (*)    HCT 30.8 (*)    MCV 101.0 (*)    Platelets 124 (*)    All other components within normal limits  BASIC METABOLIC PANEL - Abnormal; Notable for the following:    Chloride 98 (*)    Glucose, Bld 148 (*)    BUN 38 (*)    Creatinine, Ser 2.70 (*)    Calcium 8.8 (*)    GFR calc non Af Amer 24 (*)    GFR calc Af Amer 28 (*)    All other components within normal limits  PROTIME-INR - Abnormal; Notable for the following:    Prothrombin Time 23.8 (*)    INR 2.14 (*)    All other components within normal limits  TYPE AND SCREEN   HEMOGLOBIN  Date Value Ref Range Status  09/02/2015 9.9* 13.0 - 17.0 g/dL Final  08/30/2015 10.2* 13.0 - 17.0 g/dL Final  08/28/2015 9.8* 13.0 - 17.0 g/dL Final    Comment:    REPEATED TO VERIFY  08/27/2015 10.2* 13.0 - 17.0 g/dL Final   BUN  Date Value Ref Range Status  09/02/2015 38* 6 - 20 mg/dL Final  08/30/2015 46* 6 - 20 mg/dL Final  08/29/2015 49* 6 - 20 mg/dL Final  08/28/2015 53* 6 - 20 mg/dL Final  CREAT  Date Value Ref Range Status  04/08/2015 2.20* 0.50 - 1.35  mg/dL Final  01/10/2014 1.63* 0.50 - 1.35 mg/dL Final  08/02/2013 1.33 0.50 - 1.35 mg/dL Final  06/18/2013 1.72* 0.50 - 1.35 mg/dL Final   CREATININE, SER  Date Value Ref Range Status  09/02/2015 2.70* 0.61 - 1.24 mg/dL Final  08/30/2015 2.82* 0.61 - 1.24 mg/dL Final  08/29/2015 2.92* 0.61 - 1.24 mg/dL Final  08/28/2015 3.38* 0.61 - 1.24 mg/dL Final       Imaging Review No results found.    EKG Interpretation None      MDM   Final diagnoses:  Bleeding from colostomy stoma    Initially figure-of-eight stitch 2 controlled his bleeding.  Approximately one hour later to be bleeding again from another site.  Much of this ongoing bleeding is secondary to his use of Eloquis.  Given the ongoing intermittent bleeding I asked Dr. Ninfa Linden with general surgery to come and assist in evaluation of this patient and his colostomy bleeding issue.  Additional stitches were placed by Dr. Ninfa Linden.  He reports this will likely continue to ooze some blood.  He agrees that we should stop the patient's anticoagulation this time.  Patient will follow-up with cardiology regarding ongoing use of anticoagulation.    I personally performed the services described in this documentation, which was scribed in my presence. The recorded information has been reviewed and is accurate.    Jola Schmidt, MD 09/02/15 0730

## 2015-09-02 NOTE — ED Notes (Signed)
Pt ambulated in hallway with MD

## 2015-09-02 NOTE — ED Notes (Signed)
MD at bedside. 

## 2015-09-02 NOTE — ED Notes (Signed)
Dr. Ninfa Linden paged to [phone].

## 2015-09-02 NOTE — Patient Outreach (Signed)
Transition of Care call week #1  Call attempted to [phone]-number was disconnected Call attempted to [phone] Voicemail with RNCM contact, requested return call Plan await for return call. If no return call, will attempt again. [name]. [name], RN, BSN, [place] [phone]

## 2015-09-02 NOTE — Progress Notes (Signed)
Patient ID: [name], male   DOB: 22-Mar-1956, 59 y.o.   MRN: [id number]  This gentleman is known to [facility].  Has had a colostomy for 8 years.  Now on Eliquis.  He was recently admitted for bleeding at his ostomy. This was superficial bleeding. He had a negative colonoscopy. His blood thinning medication was held and this spontaneously resolved. He now returns again with bleeding from the ostomy at the lateral edge. The ER physician placed several sutures with some success. On my exam, he started bleeding again just with physical examination. His blood appears very thin. The bleeding was at the 7:00 position. I ran an interlocking 3.0 Vicryl suture around this area and there was bleeding from every suture hole. I placed several more interrupted sutures. After this and pressure, the bleeding does seem to be controlled.  Unfortunately, there is nothing really further to offer other than continued hold on his blood thinning medication. I told him to hold it until he sees his cardiologist on Thursday. If he starts bleeding again, I instructed him on holding pin point pressure for 15 minutes to the bleeding site. We will see him again as needed.

## 2015-09-02 NOTE — ED Notes (Signed)
Pt here for bleeding around his stoma from his colostomy. He was seen here last week for same. Large amounts of blood/clots from stoma. Actively bleeding. Pt A&Ox4. Pt on Eliquis.

## 2015-09-04 ENCOUNTER — Ambulatory Visit (HOSPITAL_COMMUNITY)
Admission: RE | Admit: 2015-09-04 | Discharge: 2015-09-04 | Disposition: A | Discharge status: Home / Self Care | Payer: Medicare Other | Source: Ambulatory Visit | Attending: Cardiology | Admitting: Cardiology

## 2015-09-04 ENCOUNTER — Telehealth: Payer: Self-pay | Admitting: *Deleted

## 2015-09-04 ENCOUNTER — Telehealth: Payer: Self-pay | Admitting: Nurse Practitioner

## 2015-09-04 ENCOUNTER — Encounter (HOSPITAL_COMMUNITY): Payer: Self-pay

## 2015-09-04 VITALS — BP 98/54 | HR 83 | Wt 215.8 lb

## 2015-09-04 DIAGNOSIS — Z8249 Family history of ischemic heart disease and other diseases of the circulatory system: Secondary | ICD-10-CM | POA: Diagnosis not present

## 2015-09-04 DIAGNOSIS — E785 Hyperlipidemia, unspecified: Secondary | ICD-10-CM | POA: Insufficient documentation

## 2015-09-04 DIAGNOSIS — Z7902 Long term (current) use of antithrombotics/antiplatelets: Secondary | ICD-10-CM | POA: Insufficient documentation

## 2015-09-04 DIAGNOSIS — K219 Gastro-esophageal reflux disease without esophagitis: Secondary | ICD-10-CM | POA: Insufficient documentation

## 2015-09-04 DIAGNOSIS — E1122 Type 2 diabetes mellitus with diabetic chronic kidney disease: Secondary | ICD-10-CM | POA: Insufficient documentation

## 2015-09-04 DIAGNOSIS — I482 Chronic atrial fibrillation, unspecified: Secondary | ICD-10-CM

## 2015-09-04 DIAGNOSIS — Z79899 Other long term (current) drug therapy: Secondary | ICD-10-CM | POA: Insufficient documentation

## 2015-09-04 DIAGNOSIS — I129 Hypertensive chronic kidney disease with stage 1 through stage 4 chronic kidney disease, or unspecified chronic kidney disease: Secondary | ICD-10-CM | POA: Diagnosis not present

## 2015-09-04 DIAGNOSIS — I2510 Atherosclerotic heart disease of native coronary artery without angina pectoris: Secondary | ICD-10-CM | POA: Diagnosis not present

## 2015-09-04 DIAGNOSIS — I255 Ischemic cardiomyopathy: Secondary | ICD-10-CM | POA: Insufficient documentation

## 2015-09-04 DIAGNOSIS — Z933 Colostomy status: Secondary | ICD-10-CM | POA: Insufficient documentation

## 2015-09-04 DIAGNOSIS — N184 Chronic kidney disease, stage 4 (severe): Secondary | ICD-10-CM | POA: Diagnosis not present

## 2015-09-04 DIAGNOSIS — G4733 Obstructive sleep apnea (adult) (pediatric): Secondary | ICD-10-CM | POA: Diagnosis not present

## 2015-09-04 DIAGNOSIS — Z85048 Personal history of other malignant neoplasm of rectum, rectosigmoid junction, and anus: Secondary | ICD-10-CM | POA: Diagnosis not present

## 2015-09-04 DIAGNOSIS — I252 Old myocardial infarction: Secondary | ICD-10-CM | POA: Insufficient documentation

## 2015-09-04 DIAGNOSIS — I5022 Chronic systolic (congestive) heart failure: Secondary | ICD-10-CM | POA: Insufficient documentation

## 2015-09-04 DIAGNOSIS — Z794 Long term (current) use of insulin: Secondary | ICD-10-CM | POA: Insufficient documentation

## 2015-09-04 DIAGNOSIS — Z8546 Personal history of malignant neoplasm of prostate: Secondary | ICD-10-CM | POA: Diagnosis not present

## 2015-09-04 DIAGNOSIS — Z95810 Presence of automatic (implantable) cardiac defibrillator: Secondary | ICD-10-CM | POA: Insufficient documentation

## 2015-09-04 DIAGNOSIS — Z8673 Personal history of transient ischemic attack (TIA), and cerebral infarction without residual deficits: Secondary | ICD-10-CM | POA: Insufficient documentation

## 2015-09-04 MED ORDER — ATORVASTATIN CALCIUM 80 MG PO TABS: 80.0000 mg | ORAL_TABLET | Freq: Every day | ORAL | Status: DC

## 2015-09-04 MED ORDER — FUROSEMIDE 40 MG PO TABS: ORAL_TABLET | ORAL | Status: DC

## 2015-09-04 MED ORDER — APIXABAN 2.5 MG PO TABS: 2.5000 mg | ORAL_TABLET | Freq: Two times a day (BID) | ORAL | Status: DC

## 2015-09-04 NOTE — Telephone Encounter (Signed)
Returned pt's wife to confirm labs/ov r/s due to pt was in the hospital.... KJ mailed schedule to pt

## 2015-09-04 NOTE — Progress Notes (Signed)
Patient ID: [name], male   DOB: Dec 09, 1955, 59 y.o.   MRN: [id number] PCP: Dr. Gerarda Fraction Oncologist: Dr Benay Spice.   59 yo with history of CAD and ischemic cardiomyopathy, BiV ICD upgrade 2009, paroxysmal atrial fibrillation, CKD, and traumatic SAH in 1/16 after a fall.  He has been followed at the heart failure clinic at [facility] by Dr Carolynn Serve in the past.    He was admitted in 3/16 from the [facility] ER with exertional dyspnea/volume overload and had a cardiac arrest/ventricular fibrillation while in the hospital terminated by his ICD.  After diuresis, RHC showed relatively normal filling pressures and preserved cardiac index.  Creatinine peaked at 3.0. (was 5.0 in 1/16).  Echo in 3/16 showed EF 25-30% with restrictive diastolic function.  Cardiolite was done, showing areas of scar but no ischemia.  EF 18%.    Admitted 4/16 with hematuria and elevated LFTs. Questionable amiodarone toxicity and this was stopped.   In 5/16 Underwent elective BiV ICD generator change which went without complication. He was admitted again 04/23/15 with acute on chronic CHF and respiratory failure. During that admission he ruled in for a NSTEMI- Troponin 4.29. He has chronic stage 4 CKD and it was decided to obtain a Myoview before considering a cath. Myoview done 04/26/15 showed EF 20% with large region of prior infarct involving the apical anterior, anteroseptal, inferoseptal and inferior walls with extension to the true apex. Small region of reversible/inducible ischemia along the margin of the prior infarct at the mid ventricular anterior and anteroseptal walls. No change from previous. Also during that hospitalization he was seen in consult by the GI service. Dr Carlean Purl felt we could resume his Amiodarone with plans to follow his LFTs. BP very low in hospital  Enalapril switched to Bidil. Toprol 25 stopped.   Admitted to [facility] in 8/16 for increased dyspnea and volume overload. Diuresed with IV lasix and transitioned to  40 mg daily. D/C weight  200 pounds.  Atrial fibrillation became persistent and amiodarone was stopped.    Patient was admitted in 9/16 with bleeding from stoma site and atrial fibrillation with RVR.  Eliquis was held and bleeding stopped.  Eliquis was restarted prior to discharge.  He had AKI and initially Lasix was held.  He was noted to have < 50% BiV pacing and also to be very volume overloaded. He was then diuresed and had AV nodal ablation.    Since getting home from the hospital, he was seen in the ER with recurrent stoma bleeding on 9/27.  He was seen by surgery and the site was sutured.  He was told to hold his Eliquis again.    He feels much better since AV nodal ablation.  Breathing easier.  No dyspnea walking on flat ground.  Able to walk around grocery store.  No chest pain.  Has chronic orthopnea and sleeps in recliner.  Weight is still up from his baseline.    Corevue: Impedance trending down.    Labs (12/15): LDL 123 Labs (3/16): K 3.8, creatinine 2.78 Labs (03/10/2015): K 4.5 Creatinine 2.49  TSH 4.58, LFTs normal, digoxin 0.8 Labs (04/26/15): K 3.5 creatinine 2.13 Labs (08/04/2015): K 3.3 Creatinine 3.01  Labs (9/16): K 4.1, creatinine 2.7, HCT 30.8  PMH: 1. HTN 2. Type II diabetes 3. CAD: s/p BMS LAD in 2001, PTCA ramus and BMS LAD in 2009.  Cardiolite (3/16) with EF 18%, no ischemia, prior anterior, apical and inferior infarction. NSTEMI in 5/16.  Myoview done  04/26/15 showed EF 20% with large region of prior infarct involving the apical anterior, anteroseptal, inferoseptal and inferior walls with extension to the true apex. Small region of reversible/inducible ischemia along the margin of the prior infarct at the mid ventricular anterior and anteroseptal walls. No change from previous.  4. Chronic systolic CHF: Ischemic CMP.  St Jude CRT-D.  Echo (3/16) with EF 25-30%, restrictive diastolic function, mild LVH, mild MR.  RHC (3/16) with mean RA 9, PA 47/29, mean PCWP 16, CI 2.5  (Fick).  Suspect ACEI cough. Echo (5/16) with EF 35%.   5. SAH: 1/16 after fall (traumatic).   6. GERD 7. Atrial fibrillation: Paroxysmal initially but became persistent.  Now s/p AV nodal ablation to allow BiV pacing. 8. Rectal cancer: s/p surgery. Has colostomy.  9. Prostate cancer: s/p chemo/radiation.  10. H/o TIA 11. OSA: On CPAP.  12. Hyperlipidemia 13. CKD stage IV: Followed by Dr Lowanda Foster.  14. Bleeding from stoma.   SH: Married, lives in [place], nonsmoker.   FH: CAD  ROS: All systems reviewed and negative except as per HPI.   Current Outpatient Prescriptions  Medication Sig Dispense Refill  . co-enzyme Q-10 50 MG capsule Take 50 mg by mouth every morning.     Marland Kitchen dextromethorphan-guaiFENesin (MUCINEX DM) 30-600 MG per 12 hr tablet Take 1 tablet by mouth 2 (two) times daily as needed for cough.    . ferrous sulfate 325 (65 FE) MG tablet Take 1 tablet (325 mg total) by mouth 2 (two) times daily with a meal. 60 tablet 0  . fluticasone (FLONASE) 50 MCG/ACT nasal spray Place 1 spray into both nostrils daily as needed for allergies.     . furosemide (LASIX) 40 MG tablet Take 3 tabs in AM and 2 tabs in PM 150 tablet 3  . Insulin Glargine (TOUJEO SOLOSTAR) 300 UNIT/ML SOPN Inject 20 Units into the skin every morning.    . metoprolol succinate (TOPROL-XL) 25 MG 24 hr tablet Take 1 tablet (25 mg total) by mouth 2 (two) times daily. 30 tablet 3  . Omega-3 Fatty Acids (FISH OIL) 1000 MG CAPS Take 1,000 mg by mouth 2 (two) times daily.    . pantoprazole (PROTONIX) 40 MG tablet Take 1 tablet (40 mg total) by mouth daily. 30 tablet 0  . PROAIR RESPICLICK 132 (90 BASE) MCG/ACT AEPB Inhale 2 puffs into the lungs every 6 (six) hours as needed (for breathing).   0  . apixaban (ELIQUIS) 2.5 MG TABS tablet Take 1 tablet (2.5 mg total) by mouth 2 (two) times daily. 60 tablet 6  . atorvastatin (LIPITOR) 80 MG tablet Take 1 tablet (80 mg total) by mouth daily. 30 tablet 6   No current  facility-administered medications for this encounter.    BP 98/54 mmHg  Pulse 83  Wt 215 lb 12.8 oz (97.886 kg)  SpO2 97% General: NAD Ambulated in the clinic without difficutly.  Neck: JVP 10-12 cm.   no thyromegaly or thyroid nodule.  Lungs: Clear to auscultation bilaterally with normal respiratory effort. CV: Nondisplaced PMI.  Regular S1S2, 2/6 SEM RUSB.  1+ edema to knees.  No carotid bruit.  Normal pedal pulses.  Abdomen: Soft, nontender, no hepatosplenomegaly, no distention. S/p colostomy.  Skin: Intact without lesions or rashes.  Neurologic: Alert and oriented x 3.  Psych: Normal affect. Extremities: No clubbing or cyanosis.  HEENT: Normal.   Assessment/Plan: 1. Chronic systolic CHF: Ischemic cardiomyopathy.  Has been followed for a long time at [facility].  Deemed not transplant or VAD candidate with comorbities and advanced CKD. Has St Jude CRT-D, now s/p AV nodal ablation to promote BiV pacing.  Breathing much better since AV nodal ablation (NYHA class II-III).  However, weight is still up from baseline and he is volume overloaded. Impedance trending down on Corevue.  - Increase Lasix to 120 qam/80 qpm. BMET/BNP in 1 week.  - Continue Toprol 25 bid.  - No ACEI, spironolactone, digoxin with advanced CKD.  - BP too soft today to re-initiate hydralazine/nitrates, will try to start at next appt. - Given CKD and previous cancer not good VAD or transplant candidate (would need heart/kidney)  2. Atrial fibrillation: Chronic. CHADS2Vasc Score 5. Off ranexa and amiodarone with chronic atrial fibrillation. Now s/p AV nodal ablation, feels much better (improved BiV pacing).  He has had recurrent stoma bleeding on Eliquis, was back in ER on 9/27 and is off Eliquis again.  Difficult time stopping bleeding, had to get sutures on 9/27.  We discussed options and decided on restarting Eliquis in 2 days at lower dose, 2.5 mg bid.  Technically he only has 1 criterion for lowering the dose (elevated  creatinine), but I do not think he is going to be able to tolerate full dose anticoagulation until after the stoma has healed.  Will eventually try to get the dose back up to 5 mg bid. Check CBC with labs in 1 week.  3. CAD:  NSTEMI in 5/16. Cardiolite without high ischemic burden. No catheterization due to renal failure. Continue medical therapy. No ASA given Eliquis use.  He is on statin and Toprol XL.  4. CKD: Stage IV. Will need fistula construction.  AKI at recent admission, creatinine now better.  Will check BMET 1 week after increasing Lasix. Followed by Dr Lowanda Foster in [place].  5.  DM2: followed by Dr. Gerarda Fraction 7. H/O rectal cancer 2008: Has diverting colostomy. CT of abdomen concerned for perirectal recurrence. He has follow up with Dr Benay Spice. 8. Hyperlipidemia: Crestor 10 mg daily is the maximum dose recommended given his degree of renal dysfunction.  Therefore, will stop Crestor (he is on 40 mg daily) and start atorvastatin 80 mg daily with lipids/LFTs in 2 months.  Loralie Champagne 09/04/2015

## 2015-09-04 NOTE — Patient Instructions (Signed)
Stop Crestor  Start Atorvastatin 80 mg daily  Increase Furosemide (Lasix) to 120 mg (3 tabs) in AM and 80 mg (2 tabs) in PM  Start Eliquis 2.5 mg Twice daily STARTING ON Saturday 09/06/15  Labs in 1 week  Your physician recommends that you schedule a follow-up appointment in: 3 weeks

## 2015-09-04 NOTE — Telephone Encounter (Signed)
Wife called and stated,"my [name] missed his appointment with Dr. Hillery Aldo last week because he was hospitalized. I need to reschedule those appointments." Told wife that I would sent Dr. Benay Spice a message about missed appointment. Then someone from scheduling will call her. She verbalized understanding. Wife stated,"its OK for scheduling to leave a message on home answering machine or cell phone when they call."

## 2015-09-05 ENCOUNTER — Other Ambulatory Visit: Payer: Self-pay | Admitting: *Deleted

## 2015-09-05 ENCOUNTER — Telehealth: Payer: Self-pay | Admitting: Oncology

## 2015-09-05 NOTE — Telephone Encounter (Signed)
Lft msg for pt confirming MD visit per 09/30 POF, mailed out schedule.Marland Kitchen KJ

## 2015-09-05 NOTE — Telephone Encounter (Signed)
SCHEDULE 10/5 11:30

## 2015-09-05 NOTE — Patient Outreach (Signed)
Transition of care call Week #1  Call to patient. Patient able to discuss why he was in the hospital reports it was because of his "stoma bleeding" States he also has some kidney issues. He reports he is scheduled to have fistula placed for future dialysis. He has several upcoming appointments, he verbalized a couple of appointments but states his wife assists him with keeping up with "all" of appointments. Patient able to review medications and understands changes, changed his Crestor to Lipitor due to affect on kidneys.  Plan Patient will call RNCM with questions. RNCM scheduled appointment for outreach visit and will also continue weekly calls for transition of care program.  [name]. [name], RN, BSN, [facility] Liaison [phone]

## 2015-09-08 ENCOUNTER — Ambulatory Visit (INDEPENDENT_AMBULATORY_CARE_PROVIDER_SITE_OTHER): Payer: Medicare Other | Admitting: Internal Medicine

## 2015-09-08 ENCOUNTER — Encounter: Payer: Self-pay | Admitting: Internal Medicine

## 2015-09-08 VITALS — BP 90/56 | HR 90 | Ht 70.0 in | Wt 216.6 lb

## 2015-09-08 DIAGNOSIS — I482 Chronic atrial fibrillation: Secondary | ICD-10-CM | POA: Diagnosis not present

## 2015-09-08 DIAGNOSIS — I5022 Chronic systolic (congestive) heart failure: Secondary | ICD-10-CM

## 2015-09-08 DIAGNOSIS — I255 Ischemic cardiomyopathy: Secondary | ICD-10-CM

## 2015-09-08 DIAGNOSIS — I5023 Acute on chronic systolic (congestive) heart failure: Secondary | ICD-10-CM

## 2015-09-08 DIAGNOSIS — Z95810 Presence of automatic (implantable) cardiac defibrillator: Secondary | ICD-10-CM | POA: Diagnosis not present

## 2015-09-08 MED ORDER — METOPROLOL SUCCINATE ER 25 MG PO TB24: 12.5000 mg | ORAL_TABLET | Freq: Two times a day (BID) | ORAL | Status: DC

## 2015-09-08 NOTE — Progress Notes (Signed)
HPI [name] returns today for ongoing ICD evaluation and management. He is a pleasant 59 yo man, s/p MI, s/p ICD implant in 2002 and a BiV ICD upgrade in 2009. In the interim, he has  He has been anti-coagulated with coumadin but has had some non-compliance. He has been on fairly high dose amiodarone. He does not feel palpitations and his CHF is class 2B-3A. He was hospitalized approx. 4 weeks ago with acute on chronic systolic heart failure and sustained an appropriate ICD shock for VT. Since then he has improved.  He has undergone AV node ablation as his ventricular rate was too high. He feels better.  No Known Allergies   Current Outpatient Prescriptions  Medication Sig Dispense Refill  . apixaban (ELIQUIS) 2.5 MG TABS tablet Take 1 tablet (2.5 mg total) by mouth 2 (two) times daily. 60 tablet 6  . atorvastatin (LIPITOR) 80 MG tablet Take 1 tablet (80 mg total) by mouth daily. 30 tablet 6  . co-enzyme Q-10 50 MG capsule Take 50 mg by mouth every morning.     Marland Kitchen dextromethorphan-guaiFENesin (MUCINEX DM) 30-600 MG per 12 hr tablet Take 1 tablet by mouth 2 (two) times daily as needed for cough.    . ferrous sulfate 325 (65 FE) MG tablet Take 1 tablet (325 mg total) by mouth 2 (two) times daily with a meal. 60 tablet 0  . fluticasone (FLONASE) 50 MCG/ACT nasal spray Place 1 spray into both nostrils daily as needed for allergies.     . furosemide (LASIX) 40 MG tablet Take 3 tabs in AM and 2 tabs in PM 150 tablet 3  . Insulin Glargine (TOUJEO SOLOSTAR) 300 UNIT/ML SOPN Inject 20 Units into the skin every morning.    . metoprolol succinate (TOPROL-XL) 25 MG 24 hr tablet Take 1 tablet (25 mg total) by mouth 2 (two) times daily. 30 tablet 3  . Omega-3 Fatty Acids (FISH OIL) 1000 MG CAPS Take 1,000 mg by mouth 2 (two) times daily.    . pantoprazole (PROTONIX) 40 MG tablet Take 1 tablet (40 mg total) by mouth daily. 30 tablet 0  . PROAIR RESPICLICK 761 (90 BASE) MCG/ACT AEPB Inhale 2 puffs into  the lungs every 6 (six) hours as needed (for breathing).   0   No current facility-administered medications for this visit.     Past Medical History  Diagnosis Date  . Essential hypertension, benign   . CAD (coronary artery disease)     a. BMS to LAD 2001 at [facility] b. PTCA/atherectomy ramus and BMS to LAD 2009  . Chronic systolic heart failure ([place])   . GERD (gastroesophageal reflux disease)   . Paroxysmal atrial fibrillation ([facility])     a. on amiodarone, digoxin and Eliquis  . Adenocarcinoma of rectum ([place])     a. 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 Prostate cancer ([place])     a. s/p seed implants with chemo and radiation  . TIA (transient ischemic attack)   . OSA on CPAP   . HLD (hyperlipidemia)   . Orthostatic hypotension   . Dizziness     a. chronic. Admission for this 07/18/2014  . Ischemic cardiomyopathy     EF 18% by nuclear study 2016, multiple myocardial infarctions in past    . Chronic kidney disease, stage IV (severe) ([place])   . DM type 2, uncontrolled, with renal complications ([place])   . Obesity   . Hematuria   . HCAP (healthcare-associated pneumonia) 07/21/2015  . History  of blood transfusion     "I've had 2 units so far this year" (07/21/2015)  . Anemia     ROS:   All systems reviewed and negative except as noted in the HPI.   Past Surgical History  Procedure Laterality Date  . Internal defibrillator and pacemaker  2002  . Abdominal and perineal resection of rectum with total mesorectal excision  10/04/2007  . Colonoscopy  09/14/2011    Dr. Gala Romney: via colostomy, Single pedunculated benign inflammatory polyp. Due for surveillance Oct 2015  . Colostomy  2008  . Colonoscopy N/A 07/02/2014    Procedure: COLONOSCOPY;  Surgeon: Daneil Dolin, MD;  [facility];  Service: Endoscopy;  Laterality: N/A;  7:30 / COLONOSCOPY THRU COLOSTOMY  . Esophagogastroduodenoscopy N/A 07/02/2014    Procedure: ESOPHAGOGASTRODUODENOSCOPY (EGD);  Surgeon: Daneil Dolin, MD;  Location[facility];  Service: Endoscopy;  Laterality: N/A;  7:30  . Savory dilation N/A 07/02/2014    Procedure: SAVORY DILATION;  Surgeon: Daneil Dolin, MD;  Location: [facility];  Service: Endoscopy;  Laterality: N/A;  7:30  . Maloney dilation N/A 07/02/2014    Procedure: Venia Minks DILATION;  Surgeon: Daneil Dolin, MD;  Location: [facility];  Service: Endoscopy;  Laterality: N/A;  7:30  . Portacath placement  06/2007    "removed ~ 1 yr later"  . Left heart catheterization with coronary angiogram N/A 07/13/2013    Procedure: LEFT HEART CATHETERIZATION WITH CORONARY ANGIOGRAM;  Surgeon: Lorretta Harp, MD;  Location: [facility] CATH LAB;  Service: Cardiovascular;  Laterality: N/A;  . Colonoscopy N/A 12/11/2014    Dr. Gala Romney via colostomy. Normal. Repeat in 2021.   Marland Kitchen Esophagogastroduodenoscopy N/A 12/11/2014    F[facility]:[id number] EGD  . Cardiac defibrillator placement  2009    Upgraded to a BiV ICD  . Right heart catheterization N/A 02/24/2015    Procedure: RIGHT HEART CATH;  Surgeon: Jolaine Artist, MD;  Location: [facility] CATH LAB;  Service: Cardiovascular;  Laterality: N/A;  . Ep implantable device N/A 04/10/2015    Procedure: Ppm/Biv Ppm Generator Changeout;  Surgeon: Evans Lance, MD;  Location: [place] [facility] INVASIVE CV LAB CUPID;  Service: Cardiovascular;  Laterality: N/A;  . Bi-ventricular implantable cardioverter defibrillator  (crt-d)  2009  . Cardiac catheterization  08/2001; 2009    ; /notes 07/10/2013  . Coronary angioplasty with stent placement  2001; ~ 2006    "1 + 1"   . Givens capsule study N/A 07/23/2015    Procedure: GIVENS CAPSULE STUDY;  Surgeon: Wilford Corner, MD;  Location: [facility] ENDOSCOPY;  Service: Endoscopy;  Laterality: N/A;  . Colonoscopy N/A 08/24/2015    Procedure: COLONOSCOPY;  Surgeon: Manus Gunning, MD;  Location: [facility];  Service: Gastroenterology;  Laterality: N/A;  . Electrophysiologic study N/A 08/28/2015    Procedure: AV Node Ablation;  Surgeon: Will Meredith Leeds, MD;  Location: [place] CV LAB;  Service: Cardiovascular;  Laterality: N/A;     Family History  Problem Relation Age of Onset  . Colon cancer Mother 63  . Colon cancer Sister 40  . Coronary artery disease Father   . Colon cancer Other     2 cousins, succumbed to illness     Social History   Social History  . Marital Status: Married    Spouse Name: N/A  . Number of Children: 1  . Years of Education: N/A   Occupational History  .     Social History Main Topics  .  Smoking status: Never Smoker   . Smokeless tobacco: Never Used  . Alcohol Use: No     Comment: Former user 45 years ago  . Drug Use: No  . Sexual Activity: No   Other Topics Concern  . Not on file   Social History Narrative   Married 75 years   1 son, 2 grandkids    Denies alcohol, drugs, tobacco      BP 90/56 mmHg  Pulse 90  Ht 5\' 10"  (1.778 m)  Wt 216 lb 9.6 oz (98.249 kg)  BMI 31.08 kg/m2  SpO2 99%  Physical Exam:  Stable appearing middle aged man, NAD HEENT: Unremarkable Neck:  7 cm JVD, no thyromegally Back:  No CVA tenderness Lungs:  Clear with no wheezes, rales, or rhonchi HEART:  Regular rate rhythm, no murmurs, no rubs, no clicks, soft S4 gallop Abd:  soft, positive bowel sounds, no organomegally, no rebound, no guarding Ext:  2 plus pulses, no edema, no cyanosis, no clubbing Skin:  No rashes no nodules Neuro:  CN II through XII intact, motor grossly intact   DEVICE  Normal device function.  See PaceArt for details. Underlying rhythm is NSR  Assess/Plan:

## 2015-09-08 NOTE — Assessment & Plan Note (Signed)
He denies anginal symptoms. He will continue his current meds.  

## 2015-09-08 NOTE — Assessment & Plan Note (Signed)
His symptoms are improved after AV node ablation. He will continue his current meds except I have asked him to reduce his toprol to a half tablet twice daily.

## 2015-09-08 NOTE — Assessment & Plan Note (Signed)
His device is working normally. Will recheck in several months. 

## 2015-09-08 NOTE — Patient Instructions (Addendum)
Your physician wants you to follow-up in: 6 Months with Dr. Lovena Le.  You will receive a reminder letter in the mail two months in advance. If you don't receive a letter, please call our office to schedule the follow-up appointment.  Remote monitoring is used to monitor your Pacemaker of ICD from home. This monitoring reduces the number of office visits required to check your device to one time per year. It allows [place] to keep an eye on the functioning of your device to ensure it is working properly. You are scheduled for a device check from home on 12/09/15. You may send your transmission at any time that day. If you have a wireless device, the transmission will be sent automatically. After your physician reviews your transmission, you will receive a postcard with your next transmission date.  Your physician recommends that you continue on your current medications as directed. Please refer to the Current Medication list given to you today.  Thank you for choosing [facility]!

## 2015-09-09 ENCOUNTER — Encounter: Payer: Self-pay | Admitting: Internal Medicine

## 2015-09-10 ENCOUNTER — Ambulatory Visit: Payer: Medicare Other | Admitting: Oncology

## 2015-09-10 ENCOUNTER — Telehealth: Payer: Self-pay | Admitting: *Deleted

## 2015-09-10 DIAGNOSIS — I4891 Unspecified atrial fibrillation: Secondary | ICD-10-CM | POA: Diagnosis not present

## 2015-09-10 DIAGNOSIS — G4731 Primary central sleep apnea: Secondary | ICD-10-CM | POA: Diagnosis not present

## 2015-09-10 NOTE — Telephone Encounter (Signed)
-----   Message from Arnoldo Lenis, MD sent at 09/10/2015  2:40 PM EDT ----- Regarding: RE: Eliquis  Please forward to vascular clinic.   From our records he is still on eliquis. Ok to hold as needed for his procedure, typically for vascular procedure would hold 3 days prior and start day after.    J Branch ----- Message -----    From: Massie Maroon, CMA    Sent: 09/09/2015  12:10 PM      To: Arnoldo Lenis, MD Subject: [name]: Eliquis                                      ----- Message -----    From: Mena Goes, RN    Sent: 09/09/2015  10:39 AM      To: Robert Na, RN, Massie Maroon, CMA Subject: Eliquis                                        [name] is scheduled for Left radiocephalic AVF creation by [name] on 09-16-15. We think he is currently on Eliquis but I see that he has had some stomal bleeding. If he is on the Eliquis, Will Dr. Harl Bowie allow him to stop it after dose 09-11-15?  Please let me know asap

## 2015-09-10 NOTE — Telephone Encounter (Signed)
Routed.

## 2015-09-11 ENCOUNTER — Other Ambulatory Visit: Payer: Self-pay | Admitting: *Deleted

## 2015-09-11 NOTE — Patient Outreach (Signed)
Call to patient for Transition of Care Week #2  Call attempted, wife, [name] answered, RNCM left contact information  Plan for initial outreach visit next week. [name]. [name], RN, BSN, [facility] [phone]

## 2015-09-12 DIAGNOSIS — I5022 Chronic systolic (congestive) heart failure: Secondary | ICD-10-CM | POA: Diagnosis not present

## 2015-09-12 DIAGNOSIS — I482 Chronic atrial fibrillation: Secondary | ICD-10-CM | POA: Diagnosis not present

## 2015-09-15 ENCOUNTER — Other Ambulatory Visit: Payer: Self-pay

## 2015-09-15 ENCOUNTER — Other Ambulatory Visit: Payer: Self-pay | Admitting: *Deleted

## 2015-09-15 ENCOUNTER — Encounter (HOSPITAL_COMMUNITY): Payer: Self-pay | Admitting: Vascular Surgery

## 2015-09-15 LAB — CUP PACEART INCLINIC DEVICE CHECK
Date Time Interrogation Session: 20161003130103
HIGH POWER IMPEDANCE MEASURED VALUE: 35.9401
Lead Channel Impedance Value: 587.5 Ohm
Lead Channel Pacing Threshold Amplitude: 0.5 V
Lead Channel Pacing Threshold Amplitude: 1 V
Lead Channel Pacing Threshold Amplitude: 1 V
Lead Channel Pacing Threshold Pulse Width: 0.5 ms
Lead Channel Pacing Threshold Pulse Width: 0.5 ms
Lead Channel Pacing Threshold Pulse Width: 0.5 ms
Lead Channel Sensing Intrinsic Amplitude: 10.3 mV
Lead Channel Setting Pacing Pulse Width: 0.5 ms
MDC IDC MSMT BATTERY REMAINING LONGEVITY: 66 mo
MDC IDC MSMT LEADCHNL LV IMPEDANCE VALUE: 387.5 Ohm
MDC IDC MSMT LEADCHNL RA IMPEDANCE VALUE: 362.5 Ohm
MDC IDC MSMT LEADCHNL RA SENSING INTR AMPL: 2.9 mV
MDC IDC MSMT LEADCHNL RV PACING THRESHOLD AMPLITUDE: 1 V
MDC IDC MSMT LEADCHNL RV PACING THRESHOLD AMPLITUDE: 1 V
MDC IDC MSMT LEADCHNL RV PACING THRESHOLD PULSEWIDTH: 0.8 ms
MDC IDC MSMT LEADCHNL RV PACING THRESHOLD PULSEWIDTH: 0.8 ms
MDC IDC SET LEADCHNL LV PACING AMPLITUDE: 2 V
MDC IDC SET LEADCHNL RV PACING AMPLITUDE: 2.5 V
MDC IDC SET LEADCHNL RV PACING PULSEWIDTH: 0.8 ms
MDC IDC SET LEADCHNL RV SENSING SENSITIVITY: 2 mV
MDC IDC SET ZONE DETECTION INTERVAL: 320 ms
MDC IDC SET ZONE DETECTION INTERVAL: 400 ms
MDC IDC STAT BRADY RA PERCENT PACED: 0 %
MDC IDC STAT BRADY RV PERCENT PACED: 99.92 %
Pulse Gen Serial Number: 7238042
Zone Setting Detection Interval: 270 ms

## 2015-09-15 NOTE — Progress Notes (Signed)
Anesthesia Chart Review: SAME DAY WORK-UP.   Patient is a 59 year old male scheduled for left radiocephalic AVF creation tomorrow by Dr. Oneida Alar (first case). He is not yet on hemodialysis.   History includes non-smoker, HTN and orthostatic hypotension, CAD s/p BMS LAD '01 and PTCA/atherectomy Ramus and BMS LAD '09 with in-hospital vfib arrest 02/2015 (during CHF/hypotension admission) terminated by ICD and NSTEMI 04/2015 (during CHF admission; troponin 4.29) with high risk stress test (reviwed by both Dr. Haroldine Laws and Dr. Rayann Heman and felt to represent reduced EF with large dense scar and very little ischemia with Sum score of 0, and both agreed to continued medical management), ischemic CM, chronic systolic CHF, s/p [employer] Jude BiV ICD upgrade '09 and generator change 04/10/15 (ICD initially implanted '02), PAF s/p AVN ablation 08/28/15, GERD, anemia, traumatic [facility] 12/2014 after a fall, rectal adenocarcinoma s/p colon resection with colostomy '08, prostate cancer s/p seed implants with chemoradiation, TIA, OSA on CPAP, HLD, DM2, CKD stage IV, HCAP 07/21/15. Admitted 08/23/15-08/30/15 for stoma bleeding and Eliquis temporarily held. He was seen by cardiologist Dr. Marlou Porch then and felt moderate risk for colonoscopy. During that same admission he was seen by EP cardiology for worsening heart failure and uncontrolled afib with hypotension limiting medication titration, so he underwent AVN ablation 08/28/15 by Dr. Allegra Lai and patient "significantly improved."   PCP is Dr. Gerarda Fraction. HEM-ONC is Dr. Benay Spice. Nephrologist is Dr. Lowanda Foster. Primary cardiologist is Dr. Harl Bowie. EP cardiologist is Dr. Lovena Le, last visit 09/08/15. Heart Failure cardiologist is Dr. Aundra Dubin, last visit 09/04/15. His note mentions that patient was previously "Deemed not transplant or VAD candidate with comorbities and advanced CKD."  His note also mentions that patient will need AVF construction.   Meds include Lipitor, 65 Fe, Flonase, Lasix, Toujeo,  Toprol, fish oil, Protonix, ProAir, Eliquis (to stop 3 days prior to surgery per cardiologist Dr. Carlyle Dolly). Off amiodarone due to transaminitis.  08/29/15 EKG: V-paced rhythm, bi-V pacemake detected.  04/26/15 Nuclear stress test:  Findings consistent with ischemia.  This is a high risk study.  The left ventricular ejection fraction is severely decreased (<30%).  Defect 1: There is a small defect of moderate severity present in the mid anterior and mid anteroseptal location.  Defect 2: There is a large defect present in the apical anterior, apical septal, apical inferior and apex location.  04/24/15 Echo: Study Conclusions - Left ventricle: POor acoustic windows limit study even with Definity use . LVEF is approximately is apprxoiamtely 35% with hypokinesis/akinesis of distal 1/3 of LV. The cavity size was severely dilated. Systolic function was normal. Wall motion was normal; there were no regional wall motion abnormalities. - Aortic valve: There was trivial regurgitation. - Mitral valve: There was mild regurgitation. - Left atrium: The atrium was severely dilated. - Right ventricle: Systolic function was mildly reduced. - Pulmonary arteries: PA peak pressure: 55 mm Hg (S). (Previous echocardiograms: EF 25-30% 02/24/15, 30-35% 09/27/14, and 15-20% 03/01/14.)  02/24/15 RHC (Dr. Glori Bickers): Findings: RA = 9 RV = 46/3/12 PA = 47/18/29 PCW = 16 Fick cardiac output/index = 5.3/2.5 Thermo cardiac output/index = 5.3/2.5 PVR = 2.5 WU Ao sat = 99% PA sat = 57%, 60% SVC sat = 70% Assessment: Relatively normal hemodynamics.  Plan/Discussion: Clinical course consistent with cardiogenic shock with hypotension, worsening renal function and rising lactate after ICD shock for VT. Fortunately hemodynamics now much improved. Will keep swan in overnight. Continue supportive care. Follow renal function closely.   07/13/13 Cardiac cath (Dr.  Quay Burow): HEMODYNAMICS:  AO  SYSTOLIC/AO DIASTOLIC: 127/51 ANGIOGRAPHIC RESULTS:  1. Left main; normal  2. LAD; the entire proximal third of the LAD was fluoroscopically calcified. The proximal third had approximately 50-60% segmental stenosis. The stent was widely patent in the proximal third of the LAD with 30-40% in-stent restenosis". There was a moderate size first diagonal branch and the ramus distribution that was occluded in its proximal portion and filled by collaterals. This was noted to be highly diseased at his last cath in 2009. There was 50-60% segmental stenosis in the middle third and 70% in the distal/apical third. There were 2 small marginal branches arising from the middle third that had 90% ostial stenoses unchanged from prior cath 3. Left circumflex; dominant with 99% long segmental proximal OM1 stenosis in a small to medium-size vessel unchanged from prior cath 4. Right coronary artery; nondominant with 50% mid and 80% distal stenosis unchanged from prior cath  5. Left ventriculography; not performed today to conserve contrast IMPRESSION:Ischemic myopathy with a widely patent proximal LAD date and moderate segmental calcified proximal LAD stenosis. The only change in his anatomy from 5 years ago was occlusion of the high first diagonal branch which was severely diffusely diseased previously. There are no "culprit vessels. The patient is already on maximal medical therapy. Plans will be continued medical therapy.  01/28/14 Carotid duplex: < 50% BICA stenosis.  08/02/15 Acute ABD with 1V CXR: IMPRESSION: 1. Large volume of colonic stool. No bowel dilatation to suggest obstruction. 2. Resolved right upper lobe opacity. No acute pulmonary process.  He is for labs on arrival.   Patient with a significant cardiac history as outlined above, but has had recent EP and CHF cardiology follow-up. Dr. Harl Bowie is also aware of plans for surgery and gave Eliquis instructions. Discussed with anesthesiologist Dr. Marcie Bal.  If labs are acceptable and no new cardiac issues then it is anticipated that he can proceed as planned. Further anesthesiologist evaluation on the day of surgery.  George Hugh [facility] Short Stay Center/Anesthesiology Phone [phone] 09/15/2015 1:02 PM

## 2015-09-15 NOTE — Progress Notes (Signed)
Pt SDW-pre-op call completed by pt spouse ( per note in Epic). Spouse denies SOB and chest pain but stated that pt is under the care of Dr. Haroldine Laws, cardiology. Spouse stated that pt stopped taking Eliquis as instructed but does not know the exact date. Spouse also stated that pt was instructed not to take any insulin the morning of procedure by surgeon's staff. Spouse made aware to have pt stop NSAID's, otc vitamins and herbal medications such as Q 10 and fish oil. Spouse verbalized understanding of all pre-op instructions.

## 2015-09-15 NOTE — Patient Outreach (Signed)
[facility]) Care Management   09/15/2015  [name] 1956-04-06 [id number]  [name] is an 59 y.o. male  Met with patient and his wife, [name] RNCM unable to complete any documentation in home as unable to get a signal Patient and wife state they live a "dead zone" area for signal and must maintain a land line for phone use.  Subjective:  "I have had a rough year"  "I try my best to stay out of the hospital" "I am going tomorrow to have fistula placed"  "I may not have to have dialysis"  Patient and wife report that the doctor told them that his kidneys were doing better and he may not have to have dialysis.  Patient has a lot of "appointments" states he has 7-8 doctors.  Patient verbalizes his wishes but does not have an Advanced directive in place.   Patient reports he has not been checking blood sugars regularly  Objective:  BP 90/56 mmHg  Pulse 72  Resp 20  Ht 1.778 m ($Remove'5\' 10"'VmCPIEp$ )  Wt 199 lb (90.266 kg)  BMI 28.55 kg/m2  SpO2 96%   Review of Systems  Constitutional: Negative.   HENT: Negative.   Eyes: Negative.   Respiratory: Negative.   Cardiovascular: Negative.   Gastrointestinal: Negative.   Genitourinary: Negative.   Musculoskeletal: Negative.   Skin: Negative.   Neurological: Negative.   Psychiatric/Behavioral: Negative.     Physical Exam  Constitutional: He is oriented to person, place, and time. He appears well-developed.  Neck: Normal range of motion.  Cardiovascular: Normal rate.   Respiratory: Effort normal and breath sounds normal.  GI: Soft. Bowel sounds are normal.  Musculoskeletal: Normal range of motion. He exhibits edema.  Pedal edema Rt > lt  Neurological: He is alert and oriented to person, place, and time.  Skin: Skin is warm and dry.  Psychiatric: He has a normal mood and affect. His behavior is normal. Judgment and thought content normal.    Current Medications:   Current Outpatient Prescriptions   Medication Sig Dispense Refill  . apixaban (ELIQUIS) 2.5 MG TABS tablet Take 1 tablet (2.5 mg total) by mouth 2 (two) times daily. 60 tablet 6  . atorvastatin (LIPITOR) 80 MG tablet Take 1 tablet (80 mg total) by mouth daily. 30 tablet 6  . co-enzyme Q-10 50 MG capsule Take 50 mg by mouth every morning.     Marland Kitchen dextromethorphan-guaiFENesin (MUCINEX DM) 30-600 MG per 12 hr tablet Take 1 tablet by mouth 2 (two) times daily as needed for cough.    . ferrous sulfate 325 (65 FE) MG tablet Take 1 tablet (325 mg total) by mouth 2 (two) times daily with a meal. 60 tablet 0  . fluticasone (FLONASE) 50 MCG/ACT nasal spray Place 1 spray into both nostrils daily as needed for allergies.     . furosemide (LASIX) 40 MG tablet Take 3 tabs in AM and 2 tabs in PM (Patient taking differently: Take 80-120 mg by mouth See admin instructions. Take 3 tabs in AM and 2 tabs in PM) 150 tablet 3  . Insulin Glargine (TOUJEO SOLOSTAR) 300 UNIT/ML SOPN Inject 20 Units into the skin every morning.    . metoprolol succinate (TOPROL-XL) 25 MG 24 hr tablet Take 0.5 tablets (12.5 mg total) by mouth 2 (two) times daily. 45 tablet 3  . Omega-3 Fatty Acids (FISH OIL) 1000 MG CAPS Take 1,000 mg by mouth 2 (two) times daily.    . pantoprazole (  PROTONIX) 40 MG tablet Take 1 tablet (40 mg total) by mouth daily. 30 tablet 0  . PROAIR RESPICLICK 616 (90 BASE) MCG/ACT AEPB Inhale 2 puffs into the lungs every 6 (six) hours as needed (for breathing).   0   No current facility-administered medications for this visit.    Functional Status:   In your present state of health, do you have any difficulty performing the following activities: 09/15/2015 08/23/2015  Hearing? N N  Vision? N N  Difficulty concentrating or making decisions? N N  Walking or climbing stairs? Y Y  Dressing or bathing? N N  Doing errands, shopping? N N  Preparing Food and eating ? N -  Using the Toilet? N -  In the past six months, have you accidently leaked urine? N  -  Do you have problems with loss of bowel control? N -  Managing your Medications? Y -  Managing your Finances? N -  Housekeeping or managing your Housekeeping? N -    Fall/Depression Screening:    Fall Risk  09/15/2015 08/30/2014  Falls in the past year? Yes Yes  Number falls in past yr: 2 or more 2 or more  Injury with Fall? Yes -  Risk Factor Category  High Fall Risk High Fall Risk  Risk for fall due to : History of fall(s);Impaired balance/gait;Medication side effect History of fall(s);Medication side effect   PHQ 2/9 Scores 09/15/2015  PHQ - 2 Score 0    Assessment:   -HF-needs new scale -Diabetes-not checking blood sugars -No Advanced Directive-would like to complete one -Knowledge deficit around dialysis and kidney failure -Medication regimen issues as evidenced by patient still having medications he is off in the bowl with medications he is taking. Could get confused and take wrong medications  Plan:  [facility] CM Care Plan Problem One        Most Recent Value   Care Plan Problem One  High risk for readmission as evidenced by recent back to back admissions   Role Documenting the Problem One  Care Management [place] for Problem One  Active   [facility] Long Term Goal (31-90 days)  Patient will not have any admissions for HF in the next 60 days   [facility] Long Term Goal Start Date  09/05/15   Interventions for Problem One Long Term Goal  Using teachback method, RNCM gave blue [facility] calendar and reviewed HF zones, signs and symptoms to call MD, encouraged daily weights   [facility] CM Short Term Goal #1 (0-30 days)  Patient will not have a hospital admission for HF over the next 30 days   [facility] CM Short Term Goal #1 Start Date  09/05/15   Interventions for Short Term Goal #1  Instructed wife and patient on HF zones and weight gain and when to call MD or call 911    [facility] CM Care Plan Problem Two        Most Recent Value   Care Plan Problem Two  Knowledge deficit regarding diabetes as  evidenced by statement by patient that he has changed his insulin dosage   Role Documenting the Problem Two  Care Management Coordinator   Care Plan for Problem Two  Active   [facility] CM Short Term Goal #1 (0-30 days)  Patient will inform MD of how he is taking insulin and verify the dosage in the next 14 days (at next MD appointment)   [facility] CM Short Term Goal #1 Start Date  08/12/15  [facility] CM Care Plan Problem Three        Most Recent Value   Care Plan Problem Three  Knowledge deficit related to kidney failure and dialysis as evidenced by questions from patient and wife   Role Documenting the Problem Three  Care Management Coordinator   Care Plan for Problem Three  Active   [facility] CM Short Term Goal #1 (0-30 days)  Patient will have a better understanding of kidney failure and dialysis over the next 30 days   [facility] CM Short Term Goal #1 Start Date  09/15/15   Interventions for Short Term Goal #1  Using teachback method reveiwed overview of kidney function and possible need for dialysis      RNCM gave medication box and reviewed how to organize medications RNCM will return on Wednesday and drop off AD packet, EMMI education around dialysis and kidney failure RNCM will check on price of scale from [place] will send MD initial barriers letter and encounter note Patient to keep appointment tomorrow for Fistula placement   [facility] E. [name], RN, BSN, [place] [phone]

## 2015-09-16 ENCOUNTER — Ambulatory Visit (HOSPITAL_COMMUNITY): Payer: Medicare Other | Admitting: Vascular Surgery

## 2015-09-16 ENCOUNTER — Encounter (HOSPITAL_COMMUNITY)
Admission: RE | Disposition: A | Discharge status: Home / Self Care | Payer: Self-pay | Source: Ambulatory Visit | Attending: Vascular Surgery

## 2015-09-16 ENCOUNTER — Telehealth: Payer: Self-pay | Admitting: Vascular Surgery

## 2015-09-16 ENCOUNTER — Other Ambulatory Visit: Payer: Medicare Other

## 2015-09-16 ENCOUNTER — Encounter (HOSPITAL_COMMUNITY): Payer: Self-pay | Admitting: *Deleted

## 2015-09-16 ENCOUNTER — Ambulatory Visit (HOSPITAL_COMMUNITY)
Admission: RE | Admit: 2015-09-16 | Discharge: 2015-09-16 | Disposition: A | Discharge status: Home / Self Care | Payer: Medicare Other | Source: Ambulatory Visit | Attending: Vascular Surgery | Admitting: Vascular Surgery

## 2015-09-16 DIAGNOSIS — Z79899 Other long term (current) drug therapy: Secondary | ICD-10-CM | POA: Diagnosis not present

## 2015-09-16 DIAGNOSIS — Z8674 Personal history of sudden cardiac arrest: Secondary | ICD-10-CM | POA: Insufficient documentation

## 2015-09-16 DIAGNOSIS — G8918 Other acute postprocedural pain: Secondary | ICD-10-CM

## 2015-09-16 DIAGNOSIS — Z8673 Personal history of transient ischemic attack (TIA), and cerebral infarction without residual deficits: Secondary | ICD-10-CM | POA: Diagnosis not present

## 2015-09-16 DIAGNOSIS — G4733 Obstructive sleep apnea (adult) (pediatric): Secondary | ICD-10-CM | POA: Insufficient documentation

## 2015-09-16 DIAGNOSIS — E1122 Type 2 diabetes mellitus with diabetic chronic kidney disease: Secondary | ICD-10-CM | POA: Diagnosis not present

## 2015-09-16 DIAGNOSIS — N185 Chronic kidney disease, stage 5: Secondary | ICD-10-CM | POA: Diagnosis not present

## 2015-09-16 DIAGNOSIS — K219 Gastro-esophageal reflux disease without esophagitis: Secondary | ICD-10-CM | POA: Insufficient documentation

## 2015-09-16 DIAGNOSIS — I252 Old myocardial infarction: Secondary | ICD-10-CM | POA: Insufficient documentation

## 2015-09-16 DIAGNOSIS — I255 Ischemic cardiomyopathy: Secondary | ICD-10-CM | POA: Insufficient documentation

## 2015-09-16 DIAGNOSIS — I10 Essential (primary) hypertension: Secondary | ICD-10-CM | POA: Diagnosis not present

## 2015-09-16 DIAGNOSIS — I2510 Atherosclerotic heart disease of native coronary artery without angina pectoris: Secondary | ICD-10-CM | POA: Insufficient documentation

## 2015-09-16 DIAGNOSIS — I5022 Chronic systolic (congestive) heart failure: Secondary | ICD-10-CM | POA: Diagnosis not present

## 2015-09-16 DIAGNOSIS — N189 Chronic kidney disease, unspecified: Secondary | ICD-10-CM | POA: Diagnosis not present

## 2015-09-16 DIAGNOSIS — Z955 Presence of coronary angioplasty implant and graft: Secondary | ICD-10-CM | POA: Insufficient documentation

## 2015-09-16 DIAGNOSIS — N186 End stage renal disease: Secondary | ICD-10-CM | POA: Diagnosis not present

## 2015-09-16 DIAGNOSIS — Z95810 Presence of automatic (implantable) cardiac defibrillator: Secondary | ICD-10-CM | POA: Insufficient documentation

## 2015-09-16 DIAGNOSIS — I132 Hypertensive heart and chronic kidney disease with heart failure and with stage 5 chronic kidney disease, or end stage renal disease: Secondary | ICD-10-CM | POA: Insufficient documentation

## 2015-09-16 DIAGNOSIS — E785 Hyperlipidemia, unspecified: Secondary | ICD-10-CM | POA: Diagnosis not present

## 2015-09-16 HISTORY — PX: AV FISTULA PLACEMENT: SHX1204

## 2015-09-16 HISTORY — DX: Other specified postprocedural states: Z98.890

## 2015-09-16 HISTORY — DX: Nontraumatic subarachnoid hemorrhage, unspecified: I60.9

## 2015-09-16 HISTORY — DX: Nausea with vomiting, unspecified: R11.2

## 2015-09-16 LAB — GLUCOSE, CAPILLARY
GLUCOSE-CAPILLARY: 201 mg/dL — AB (ref 65–99)
Glucose-Capillary: 216 mg/dL — ABNORMAL HIGH (ref 65–99)

## 2015-09-16 LAB — POCT I-STAT 4, (NA,K, GLUC, HGB,HCT)
Glucose, Bld: 216 mg/dL — ABNORMAL HIGH (ref 65–99)
HEMATOCRIT: 29 % — AB (ref 39.0–52.0)
HEMOGLOBIN: 9.9 g/dL — AB (ref 13.0–17.0)
Potassium: 3.1 mmol/L — ABNORMAL LOW (ref 3.5–5.1)
Sodium: 139 mmol/L (ref 135–145)

## 2015-09-16 SURGERY — ARTERIOVENOUS (AV) FISTULA CREATION
Anesthesia: Monitor Anesthesia Care | Site: Arm Upper | Laterality: Right

## 2015-09-16 MED ORDER — 0.9 % SODIUM CHLORIDE (POUR BTL) OPTIME
TOPICAL | Status: DC | PRN
  Administered 2015-09-16: 1000 mL

## 2015-09-16 MED ORDER — ACETAMINOPHEN-CODEINE #2 300-15 MG PO TABS: 1.0000 | ORAL_TABLET | ORAL | Status: DC | PRN

## 2015-09-16 MED ORDER — SODIUM CHLORIDE 0.9 % IJ SOLN
INTRAMUSCULAR | Status: AC
  Filled 2015-09-16: qty 10

## 2015-09-16 MED ORDER — PROPOFOL 10 MG/ML IV BOLUS
INTRAVENOUS | Status: AC
  Filled 2015-09-16: qty 20

## 2015-09-16 MED ORDER — HEPARIN SODIUM (PORCINE) 5000 UNIT/ML IJ SOLN
INTRAMUSCULAR | Status: DC | PRN
  Administered 2015-09-16: 08:00:00

## 2015-09-16 MED ORDER — ROCURONIUM BROMIDE 50 MG/5ML IV SOLN
INTRAVENOUS | Status: AC
  Filled 2015-09-16: qty 1

## 2015-09-16 MED ORDER — HEPARIN SODIUM (PORCINE) 1000 UNIT/ML IJ SOLN
INTRAMUSCULAR | Status: DC | PRN
  Administered 2015-09-16: 5000 [IU] via INTRAVENOUS

## 2015-09-16 MED ORDER — LIDOCAINE HCL (PF) 1 % IJ SOLN
INTRAMUSCULAR | Status: AC
  Filled 2015-09-16: qty 30

## 2015-09-16 MED ORDER — SODIUM CHLORIDE 0.9 % IV SOLN
INTRAVENOUS | Status: DC
  Administered 2015-09-16: 08:00:00 via INTRAVENOUS

## 2015-09-16 MED ORDER — MIDAZOLAM HCL 5 MG/5ML IJ SOLN
INTRAMUSCULAR | Status: DC | PRN
  Administered 2015-09-16: 2 mg via INTRAVENOUS

## 2015-09-16 MED ORDER — FENTANYL CITRATE (PF) 100 MCG/2ML IJ SOLN
INTRAMUSCULAR | Status: DC | PRN
  Administered 2015-09-16: 50 ug via INTRAVENOUS
  Administered 2015-09-16: 25 ug via INTRAVENOUS

## 2015-09-16 MED ORDER — PHENYLEPHRINE 40 MCG/ML (10ML) SYRINGE FOR IV PUSH (FOR BLOOD PRESSURE SUPPORT)
PREFILLED_SYRINGE | INTRAVENOUS | Status: AC
  Filled 2015-09-16: qty 10

## 2015-09-16 MED ORDER — MIDAZOLAM HCL 2 MG/2ML IJ SOLN
INTRAMUSCULAR | Status: AC
  Filled 2015-09-16: qty 4

## 2015-09-16 MED ORDER — GLYCOPYRROLATE 0.2 MG/ML IJ SOLN
INTRAMUSCULAR | Status: AC
  Filled 2015-09-16: qty 1

## 2015-09-16 MED ORDER — HEPARIN SODIUM (PORCINE) 1000 UNIT/ML IJ SOLN
INTRAMUSCULAR | Status: AC
  Filled 2015-09-16: qty 1

## 2015-09-16 MED ORDER — PROPOFOL 10 MG/ML IV BOLUS
INTRAVENOUS | Status: DC | PRN
  Administered 2015-09-16: 5 mg via INTRAVENOUS
  Administered 2015-09-16: 10 mg via INTRAVENOUS

## 2015-09-16 MED ORDER — ACETAMINOPHEN-CODEINE #3 300-30 MG PO TABS: 1.0000 | ORAL_TABLET | ORAL | Status: DC | PRN

## 2015-09-16 MED ORDER — SUCCINYLCHOLINE CHLORIDE 20 MG/ML IJ SOLN
INTRAMUSCULAR | Status: AC
  Filled 2015-09-16: qty 1

## 2015-09-16 MED ORDER — PROPOFOL 500 MG/50ML IV EMUL
INTRAVENOUS | Status: DC | PRN
  Administered 2015-09-16: 25 ug/kg/min via INTRAVENOUS

## 2015-09-16 MED ORDER — ONDANSETRON HCL 4 MG/2ML IJ SOLN
INTRAMUSCULAR | Status: DC | PRN
  Administered 2015-09-16: 4 mg via INTRAVENOUS

## 2015-09-16 MED ORDER — FENTANYL CITRATE (PF) 250 MCG/5ML IJ SOLN
INTRAMUSCULAR | Status: AC
  Filled 2015-09-16: qty 5

## 2015-09-16 MED ORDER — PROTAMINE SULFATE 10 MG/ML IV SOLN
INTRAVENOUS | Status: AC
  Filled 2015-09-16: qty 5

## 2015-09-16 MED ORDER — CHLORHEXIDINE GLUCONATE CLOTH 2 % EX PADS: 6.0000 | MEDICATED_PAD | Freq: Once | CUTANEOUS | Status: DC

## 2015-09-16 MED ORDER — LIDOCAINE HCL (CARDIAC) 20 MG/ML IV SOLN
INTRAVENOUS | Status: AC
  Filled 2015-09-16: qty 5

## 2015-09-16 MED ORDER — LIDOCAINE HCL (PF) 1 % IJ SOLN
INTRAMUSCULAR | Status: DC | PRN
  Administered 2015-09-16: 30 mL

## 2015-09-16 MED ORDER — DEXTROSE 5 % IV SOLN
1.5000 g | INTRAVENOUS | Status: AC
  Administered 2015-09-16: 1.5 g via INTRAVENOUS
  Filled 2015-09-16: qty 1.5

## 2015-09-16 MED ORDER — EPHEDRINE SULFATE 50 MG/ML IJ SOLN
INTRAMUSCULAR | Status: AC
  Filled 2015-09-16: qty 1

## 2015-09-16 SURGICAL SUPPLY — 36 items
ARMBAND PINK RESTRICT EXTREMIT (MISCELLANEOUS) ×2 IMPLANT
CANISTER SUCTION 2500CC (MISCELLANEOUS) ×2 IMPLANT
CANNULA VESSEL 3MM 2 BLNT TIP (CANNULA) ×2 IMPLANT
CLIP TI MEDIUM 6 (CLIP) ×2 IMPLANT
CLIP TI WIDE RED SMALL 6 (CLIP) ×2 IMPLANT
COVER MAYO STAND STRL (DRAPES) ×1 IMPLANT
COVER PROBE W GEL 5X96 (DRAPES) IMPLANT
DECANTER SPIKE VIAL GLASS SM (MISCELLANEOUS) ×2 IMPLANT
DRAIN PENROSE 1/4X12 LTX STRL (WOUND CARE) ×2 IMPLANT
ELECT REM PT RETURN 9FT ADLT (ELECTROSURGICAL) ×2
ELECTRODE REM PT RTRN 9FT ADLT (ELECTROSURGICAL) ×1 IMPLANT
GLOVE BIO SURGEON STRL SZ7.5 (GLOVE) ×2 IMPLANT
GLOVE BIOGEL PI IND STRL 6.5 (GLOVE) IMPLANT
GLOVE BIOGEL PI IND STRL 7.0 (GLOVE) IMPLANT
GLOVE BIOGEL PI INDICATOR 6.5 (GLOVE) ×1
GLOVE BIOGEL PI INDICATOR 7.0 (GLOVE) ×4
GLOVE ECLIPSE 6.5 STRL STRAW (GLOVE) ×2 IMPLANT
GLOVE SS BIOGEL STRL SZ 6.5 (GLOVE) IMPLANT
GLOVE SUPERSENSE BIOGEL SZ 6.5 (GLOVE) ×1
GOWN STRL REUS W/ TWL LRG LVL3 (GOWN DISPOSABLE) ×3 IMPLANT
GOWN STRL REUS W/TWL LRG LVL3 (GOWN DISPOSABLE) ×6
KIT BASIN OR (CUSTOM PROCEDURE TRAY) ×2 IMPLANT
KIT ROOM TURNOVER OR (KITS) ×2 IMPLANT
LIQUID BAND (GAUZE/BANDAGES/DRESSINGS) ×2 IMPLANT
LOOP VESSEL MINI RED (MISCELLANEOUS) IMPLANT
NS IRRIG 1000ML POUR BTL (IV SOLUTION) ×2 IMPLANT
PACK CV ACCESS (CUSTOM PROCEDURE TRAY) ×2 IMPLANT
PAD ARMBOARD 7.5X6 YLW CONV (MISCELLANEOUS) ×4 IMPLANT
SPONGE SURGIFOAM ABS GEL 100 (HEMOSTASIS) IMPLANT
SUT PROLENE 7 0 BV 1 (SUTURE) ×3 IMPLANT
SUT VIC AB 3-0 SH 27 (SUTURE) ×2
SUT VIC AB 3-0 SH 27X BRD (SUTURE) ×1 IMPLANT
SUT VICRYL 4-0 PS2 18IN ABS (SUTURE) ×2 IMPLANT
TOWEL NATURAL 6PK STERILE (DISPOSABLE) ×1 IMPLANT
UNDERPAD 30X30 INCONTINENT (UNDERPADS AND DIAPERS) ×2 IMPLANT
WATER STERILE IRR 1000ML POUR (IV SOLUTION) ×2 IMPLANT

## 2015-09-16 NOTE — H&P (View-Only) (Signed)
[facility] [place]  Subjective  - feels ok   Objective 91/60 84 98.1 F (36.7 C) (Axillary) 18 100%  Intake/Output Summary (Last 24 hours) at 08/29/15 1148 Last data filed at 08/29/15 0913  Gross per 24 hour  Intake    120 ml  Output   2600 ml  Net  -2480 ml   2+ brachial pulse right side  Assessment/Planning: Pt with left side AICD Has good upper arm cephalic on right Will schedule for right arm av fistula in a few weeks as outpt  Ruta Hinds 08/29/2015 11:48 AM --  Laboratory Lab Results:  Recent Labs  08/27/15 0810 08/28/15 0444  WBC 5.5 6.0  HGB 10.2* 9.8*  HCT 30.0* 29.0*  PLT 114* 103*   BMET  Recent Labs  08/28/15 0444 08/29/15 0442  NA 138 136  K 3.4* 3.5  CL 105 100*  CO2 24 24  GLUCOSE 96 238*  BUN 53* 49*  CREATININE 3.38* 2.92*  CALCIUM 8.6* 8.9    COAG Lab Results  Component Value Date   INR 1.82* 08/23/2015   INR 1.19 04/08/2015   INR 1.53* 04/04/2015   No results found for: PTT

## 2015-09-16 NOTE — Op Note (Signed)
Procedure: Right Brachial Cephalic AV fistula  Preop: ESRD  Postop: ESRD  Anesthesia: Local with sedation  Assistant:  Delena Serve RNFA  Findings: 3.5 mm cephalic vein  Procedure: After obtaining informed consent, the patient was taken to the operating room.  After adequate sedation, the right upper extremity was prepped and draped in usual sterile fashion.  A transverse incision was then made near the antecubital crease the right arm. The incision was carried into the subcutaneous tissues down to level of the cephalic vein. The cephalic vein was approximately 3.5 mm in diameter. It was of good quality. This was dissected free circumferentially and small side branches ligated and divided between silk ties or clips. Next the brachial artery was dissected free in the medial portion of the incision. The artery was  3-4 mm in diameter. The vessel loops were placed proximal and distal to the planned site of arteriotomy. The patient was given 5000 units of intravenous heparin. After appropriate circulation time, the vessel loops were used to control the artery. A longitudinal opening was made in the brachial artery.  The vein was ligated distally with a 2-0 silk tie. The vein was controlled proximally with a fine bulldog clamp. The vein was then swung over to the artery and sewn end of vein to side of artery using a running 7-0 Prolene suture. Just prior to completion of the anastomosis, everything was fore bled back bled and thoroughly flushed. The anastomosis was secured, vessel loops released, and there was a palpable thrill in the fistula immediately. After hemostasis was obtained, the subcutaneous tissues were reapproximated using a running 3-0 Vicryl suture. The skin was then closed with a 4 Vicryl subcuticular stitch. Dermabond was applied to the skin incision.  The patient had an audible radial doppler signal at the end of the case which augmented about 50% with compression of the  fistula.  Ruta Hinds, MD [facility] [place] Office: [phone] Pager: [phone]

## 2015-09-16 NOTE — Telephone Encounter (Signed)
-----   Message from Mena Goes, RN sent at 09/16/2015 10:01 AM EDT ----- Regarding: schedule   ----- Message -----    From: Elam Dutch, MD    Sent: 09/16/2015   9:21 AM      To: Vvs Charge Pool  Left brachial cephalic AVF Nurse asst  Needs follow up appt in 1 month  Ruta Hinds

## 2015-09-16 NOTE — Anesthesia Postprocedure Evaluation (Signed)
  Anesthesia Post-op Note  Patient: [name]  Procedure(s) Performed: Procedure(s): ARTERIOVENOUS (AV) FISTULA CREATION - BRACHIOCEPHALIC (Right)  Patient Location: PACU  Anesthesia Type:MAC  Level of Consciousness: awake, alert , oriented and patient cooperative  Airway and Oxygen Therapy: Patient Spontanous Breathing  Post-op Pain: none  Post-op Assessment: Post-op Vital signs reviewed, Patient's Cardiovascular Status Stable, Respiratory Function Stable, Patent Airway, No signs of Nausea or vomiting and Pain level controlled              Post-op Vital Signs: Reviewed and stable  Last Vitals:  Filed Vitals:   09/16/15 1030  BP: 95/62  Pulse: 70  Temp:   Resp: 16    Complications: No apparent anesthesia complications

## 2015-09-16 NOTE — Progress Notes (Signed)
Patient consent reads left arm fistula, but patient states it is supposed to be the right arm. Notified Dr. Oneida Alar, Dr. Oneida Alar came to bedside and confirmed that the right arm is the correct arm. Consent corrected by Dr. Oneida Alar and signed by patient.

## 2015-09-16 NOTE — Anesthesia Preprocedure Evaluation (Addendum)
Anesthesia Evaluation  Patient identified by MRN, date of birth, ID band Patient awake    Reviewed: Allergy & Precautions, NPO status , Patient's Chart, lab work & pertinent test results, reviewed documented beta blocker date and time   History of Anesthesia Complications (+) PONV and history of anesthetic complications  Airway Mallampati: II  TM Distance: >3 FB Neck ROM: Full    Dental  (+) Dental Advisory Given, Poor Dentition, Chipped   Pulmonary sleep apnea and Continuous Positive Airway Pressure Ventilation ,    breath sounds clear to auscultation       Cardiovascular hypertension, Pt. on medications and Pt. on home beta blockers (-) angina+ CAD, + Past MI, + Cardiac Stents and +CHF  + dysrhythmias Atrial Fibrillation + pacemaker + Cardiac Defibrillator  Rhythm:Regular Rate:Normal  5/16 stress: Findings consistent with ischemia. The left ventricular ejection fraction is severely decreased (<30%).    Defect 1: There is a small defect of moderate severity present in the mid anterior and mid anteroseptal location.    Defect 2: There is a large defect present in the apical anterior, apical septal, apical inferior and apex location.    Neuro/Psych H/o SAH TIA   GI/Hepatic Neg liver ROS, GERD  Medicated and Poorly Controlled,  Endo/Other  diabetes, Type 2, Insulin Dependent  Renal/GU Renal disease     Musculoskeletal   Abdominal   Peds  Hematology  (+) Blood dyscrasia (eliquis), ,   Anesthesia Other Findings   Reproductive/Obstetrics                       Anesthesia Physical Anesthesia Plan  ASA: III  Anesthesia Plan: MAC   Post-op Pain Management:    Induction:   Airway Management Planned: Natural Airway and Simple Face Mask  Additional Equipment:   Intra-op Plan:   Post-operative Plan:   Informed Consent: I have reviewed the patients History and Physical, chart, labs and  discussed the procedure including the risks, benefits and alternatives for the proposed anesthesia with the patient or authorized representative who has indicated his/her understanding and acceptance.   Dental advisory given  Plan Discussed with: CRNA and Surgeon  Anesthesia Plan Comments: (Plan routine monitors, MAC)        Anesthesia Quick Evaluation

## 2015-09-16 NOTE — Telephone Encounter (Signed)
Sent letter to home address, dpm  °

## 2015-09-16 NOTE — Transfer of Care (Signed)
Immediate Anesthesia Transfer of Care Note  Patient: [name]  Procedure(s) Performed: Procedure(s): ARTERIOVENOUS (AV) FISTULA CREATION - BRACHIOCEPHALIC (Right)  Patient Location: PACU  Anesthesia Type:MAC  Level of Consciousness: awake, alert , oriented and patient cooperative  Airway & Oxygen Therapy: Patient Spontanous Breathing  Post-op Assessment: Report given to RN and Post -op Vital signs reviewed and stable  Post vital signs: Reviewed  Last Vitals:  Filed Vitals:   09/16/15 0615  BP: 102/58  Pulse: 73  Temp: 36.5 C  Resp: 18    Complications: No apparent anesthesia complications

## 2015-09-16 NOTE — Anesthesia Procedure Notes (Signed)
Procedure Name: MAC Date/Time: 09/16/2015 7:45 AM Performed by: Jenne Campus Pre-anesthesia Checklist: Patient identified, Emergency Drugs available, Suction available, Patient being monitored and Timeout performed Patient Re-evaluated:Patient Re-evaluated prior to inductionOxygen Delivery Method: Simple face mask

## 2015-09-16 NOTE — Interval H&P Note (Signed)
History and Physical Interval Note:  09/16/2015 7:19 AM  [name]  has presented today for surgery, with the diagnosis of Chronic kidney disease  The various methods of treatment have been discussed with the patient and family. After consideration of risks, benefits and other options for treatment, the patient has consented to Right arm AV Fistula as a surgical intervention .  The patient's history has been reviewed, patient examined, no change in status, stable for surgery.  I have reviewed the patient's chart and labs.  Questions were answered to the patient's satisfaction.     Ruta Hinds

## 2015-09-17 ENCOUNTER — Encounter (HOSPITAL_COMMUNITY): Payer: Self-pay | Admitting: Vascular Surgery

## 2015-09-17 ENCOUNTER — Other Ambulatory Visit: Payer: Self-pay | Admitting: *Deleted

## 2015-09-17 NOTE — Patient Outreach (Signed)
[name]) Care Management  09/17/2015  [name] Oct 14, 1956 [id number]   Brief visit made with patient and wife. Patient had AV Fistula placed in right antecubital yesterday. Patient reports he is doing well, plans to go fishing Friday night will be careful and not lift with right arm and will cover site and keep it from getting wet. Patient verified insulin and dosage.  Patient and wife appreciative of AD packet.   RNCM reviewed signs and symptoms to report to MD regarding infection and bleeding RNCM reviewed with patient the instructions given in regard to after care of AV Fistula RNCM gave AD and Most  documents to patient and wife and gave overview and encouraged them to review and have discussion about patient wishes. Plan to continue transition of care calls  Plan to visit again in November  Robert Maxcy E. [name], RN, BSN, [place] [phone]

## 2015-09-19 ENCOUNTER — Other Ambulatory Visit: Payer: Medicare Other

## 2015-09-19 ENCOUNTER — Ambulatory Visit (HOSPITAL_BASED_OUTPATIENT_CLINIC_OR_DEPARTMENT_OTHER): Payer: Medicare Other | Admitting: Nurse Practitioner

## 2015-09-19 ENCOUNTER — Telehealth: Payer: Self-pay | Admitting: Oncology

## 2015-09-19 VITALS — BP 95/56 | HR 72 | Temp 97.8°F | Resp 17 | Ht 70.0 in | Wt 223.9 lb

## 2015-09-19 DIAGNOSIS — Z85048 Personal history of other malignant neoplasm of rectum, rectosigmoid junction, and anus: Secondary | ICD-10-CM

## 2015-09-19 DIAGNOSIS — Z8546 Personal history of malignant neoplasm of prostate: Secondary | ICD-10-CM | POA: Diagnosis not present

## 2015-09-19 DIAGNOSIS — E119 Type 2 diabetes mellitus without complications: Secondary | ICD-10-CM

## 2015-09-19 NOTE — Telephone Encounter (Signed)
Gave adn printed appt sched and avs for pt for DEc....gv barium

## 2015-09-19 NOTE — Progress Notes (Addendum)
Robert OFFICE PROGRESS NOTE   Diagnosis:  Rectal cancer  INTERVAL HISTORY:   [name] returns for follow-up. He recently underwent placement of a right arm fistula. Ostomy is functioning. He intermittently notes bleeding from the stoma. No nausea or vomiting. He denies pain. He specifically denies rectal pain.  Objective:  Vital signs in last 24 hours:  Blood pressure 95/56, pulse 72, temperature 97.8 F (36.6 C), temperature source Oral, resp. rate 17, height 5\' 10"  (1.778 m), weight 223 lb 14.4 oz (101.56 kg), SpO2 100 %.    HEENT: No thrush or ulcers. Lymphatics: No palpable cervical, supraclavicular, axillary or inguinal lymph nodes. Resp: Lungs clear bilaterally. Cardio: Regular rate and rhythm. GI: Abdomen soft and nontender. Mildly distended. No hepatomegaly. Left lower quadrant ostomy. Vascular: Trace lower leg edema bilaterally.   Lab Results:  Lab Results  Component Value Date   WBC 7.3 09/02/2015   HGB 9.9* 09/16/2015   HCT 29.0* 09/16/2015   MCV 101.0* 09/02/2015   PLT 124* 09/02/2015   NEUTROABS 3.8 08/23/2015    Imaging:  No results found.  Medications: I have reviewed the patient's current medications.  Assessment/Plan: 1. Stage III rectal cancer, diagnosed in June of 2008: Status post neoadjuvant infusional 5-FU and concurrent radiation. He underwent an APR 10/04/2007 with the pathology confirming stage III disease. He completed 8 cycles of adjuvant FOLFOX therapy on 03/12/2008. A restaging CT 05/26/2010 showed no evidence of metastatic disease. He underwent a colonoscopy in October of 2012 with removal of a single pedunculated polyp-benign pathology 2. Prostate cancer: Status post radiation and 2 years of Lupron therapy per Dr. Terance Hart. 3. History of thrombocytopenia secondary to chemotherapy. The platelet count was normal on 05/02/12. 4. History of delayed nausea secondary to chemotherapy: Improved with Aloxi. 5. History of  oxaliplatin neuropathy. 6. Ischemic cardiomyopathy followed by Dr. Rollene Fare. 7. History of bilateral axillary fullness. 8. Diabetes. 9. Status post Port-A-Cath removal. 10. Hospitalization with pneumonia October 2012. 11. Question small bilateral inguinal lymph nodes on exam 04/17/2013-? Small left inguinal node on exam 07/17/2013. 12. CT 04/05/2015 with a 2.7 cm perirectal nodule; CT 07/31/2015 with mild increase in size of asymmetric right perirectal soft tissue density. 13. Placement of right arm fistula 09/16/2015.   Disposition: [name] has a history of stage III rectal cancer dating to June 2008. CT scan 07/31/2015 to follow-up a perirectal nodule showed a mild increase in size. Dr. Benay Spice reviewed the CT report and images with [name] and his wife. They understand the nodule could represent recurrence of rectal cancer, scar tissue or another etiology. He is not a candidate for surgical resection. He had radiation at the time of initial diagnosis in 2008. Given his multiple comorbidities Dr. Benay Spice recommends continued observation with a repeat CT scan in 4 months. [name] is agreeable with this plan. He will return for a follow-up visit in late December 2016 to review the follow-up CT scan results. He will contact the office in the interim with any problems.  Patient seen with Dr. Benay Spice. 25 minutes were spent face-to-face at today's visit with the majority of that time involved in counseling/coordination of care.    Ned Card ANP/GNP-BC   09/19/2015  12:18 PM   This was a shared visit with Ned Card. We reviewed the CT images with [name] and his wife. The perirectal nodule was slightly increased in size on the CT 07/31/2015. He appears asymptomatic from this lesion and there is no other evidence  of disease progression. He has multiple significant comorbid conditions. I recommend observation. He agrees.  Julieanne Manson, M.D.

## 2015-09-20 LAB — CEA: CEA: 1.4 ng/mL (ref 0.0–5.0)

## 2015-09-22 DIAGNOSIS — R809 Proteinuria, unspecified: Secondary | ICD-10-CM | POA: Diagnosis not present

## 2015-09-22 DIAGNOSIS — N184 Chronic kidney disease, stage 4 (severe): Secondary | ICD-10-CM | POA: Diagnosis not present

## 2015-09-22 DIAGNOSIS — I509 Heart failure, unspecified: Secondary | ICD-10-CM | POA: Diagnosis not present

## 2015-09-22 DIAGNOSIS — D649 Anemia, unspecified: Secondary | ICD-10-CM | POA: Diagnosis not present

## 2015-09-22 DIAGNOSIS — Z79899 Other long term (current) drug therapy: Secondary | ICD-10-CM | POA: Diagnosis not present

## 2015-09-23 ENCOUNTER — Other Ambulatory Visit: Payer: Self-pay | Admitting: *Deleted

## 2015-09-23 NOTE — Patient Outreach (Signed)
Transition of Care week #3 Call to patient home, spoke with patient wife, [name]. She reports that patient is doing well, he has not had any issues with fistula site, no bleeding or signs or symptoms of infection. She reports patient is weighing daily, weight is 201#, no HF symptoms. Patient also checking blood sugars and logging them, they are running in low 100's. Patient using medication box, RNCM provided.  Patient has appointment with GI and Kidney doctors tomorrow.   RNCM scheduled appointment for visit in November. RNCM will continue patient in transition of care program  Patient and wife will call with any issues or problems [name]. [name], RN, BSN, [place] [phone]

## 2015-09-24 ENCOUNTER — Encounter: Payer: Self-pay | Admitting: Nurse Practitioner

## 2015-09-24 ENCOUNTER — Telehealth (HOSPITAL_COMMUNITY): Payer: Self-pay | Admitting: *Deleted

## 2015-09-24 ENCOUNTER — Ambulatory Visit (INDEPENDENT_AMBULATORY_CARE_PROVIDER_SITE_OTHER): Payer: Medicare Other | Admitting: Nurse Practitioner

## 2015-09-24 VITALS — BP 101/63 | HR 81 | Temp 97.6°F | Ht 70.0 in | Wt 227.6 lb

## 2015-09-24 DIAGNOSIS — I255 Ischemic cardiomyopathy: Secondary | ICD-10-CM

## 2015-09-24 DIAGNOSIS — K9401 Colostomy hemorrhage: Secondary | ICD-10-CM

## 2015-09-24 DIAGNOSIS — E871 Hypo-osmolality and hyponatremia: Secondary | ICD-10-CM | POA: Diagnosis not present

## 2015-09-24 DIAGNOSIS — Z86010 Personal history of colonic polyps: Secondary | ICD-10-CM | POA: Diagnosis not present

## 2015-09-24 DIAGNOSIS — I509 Heart failure, unspecified: Secondary | ICD-10-CM | POA: Diagnosis not present

## 2015-09-24 DIAGNOSIS — N184 Chronic kidney disease, stage 4 (severe): Secondary | ICD-10-CM | POA: Diagnosis not present

## 2015-09-24 DIAGNOSIS — D638 Anemia in other chronic diseases classified elsewhere: Secondary | ICD-10-CM | POA: Diagnosis not present

## 2015-09-24 DIAGNOSIS — K5909 Other constipation: Secondary | ICD-10-CM

## 2015-09-24 DIAGNOSIS — I5032 Chronic diastolic (congestive) heart failure: Secondary | ICD-10-CM

## 2015-09-24 NOTE — Assessment & Plan Note (Signed)
Patient with a history of colon polyps with last colonoscopy by our practice January 2016 with recommended 5 year repeat colonoscopy. Due to his bleeding [facility] from his stomal site another colonoscopy was done 08/23/2015 which did find a diminutive polyp which was not removed because of active bleeding on anticoagulate relation. We'll discuss with Dr. Gala Romney about whether there is a need to possibly shorten interval to next colonoscopy.

## 2015-09-24 NOTE — Telephone Encounter (Signed)
Pt called an left message on VM about weight gain of 4 pounds. Tried to call pt back no answer left message to call back.

## 2015-09-24 NOTE — Patient Instructions (Signed)
1. Call Dr. Linna Hoff today and let him know about your weight increase in fluid. 2. Return for follow-up in 6 months. 3. If you have any problems before then feel free to give [place] a call.

## 2015-09-24 NOTE — Progress Notes (Signed)
cc'ed to pcp °

## 2015-09-24 NOTE — Progress Notes (Signed)
Referring Provider: Redmond School, MD Primary Care Physician:  Glo Herring., MD Primary GI:  Dr. Gala Romney  Chief Complaint  Patient presents with  . Follow-up    HPI:   59 year old male presents for follow-up on right upper quadrant pain and constipation. At last office visit on 08/01/2015 his symptoms of constipation were currently well-controlled and right upper quadrant pain had resolved. Transaminitis which in and previously noted with transient obstructive cholangitis also showed hepatic labs resolved as well. He was having anemia with bleeding likely from stoma site with Endoscopy during a Previous Admission Showing Small Nonbleeding Small Bowel AVM. Of Note He Was More Recently Admitted to [facility] on 08/23/2015 for GI Bleed at Which Point a Colonoscopy Was Done Which Found a 3 Mm Diminutive Polyp Not Removed Due To Recent Bleeding in a Liquid to Use As Well As Active Bleeding at the Stoma Site from a Small Cut/Irritation with Hemostasis Achieved through Surface Pressure. He Was Again Seen in the Emergency Room 09/02/2015 for Bleeding Stoma and Colostomy Bag Full of Blood Which Is Initially Controlled with Staging and Evaluation by Surgical Team Although It Was Decided He Should Likely Discontinue His Anticoagulation. Is a liquids was reducing dose to 2.5 mg of the colon eventually returning him to 5 mg.  Today he states he had one additional small episode of stomal bleeding which he stopped himself per instructions from surgeon. He remains on 2.5 mg Eliquis. He is still struggling with fluid overload. Had AV fistula placed about a week ago, is scheduled to see Nephrology later today. Denies abdominal pain, N/V, hematochezia and melena in colostomy bag, fever, chills, unintentional weight loss. Denies chest pain, dyspnea worse than baseline, dizziness, lightheadedness, syncope, near syncope. Denies any other upper or lower GI symptoms.  Of note, patient states his weight has  increased with a history of CHF. He states he will call the heart failure clinic/team this afternoon. I encouraged him to do so.  Past Medical History  Diagnosis Date  . Essential hypertension, benign   . CAD (coronary artery disease)     a. BMS to LAD 2001 at [facility] b. PTCA/atherectomy ramus and BMS to LAD 2009  . Chronic systolic heart failure ([place])   . GERD (gastroesophageal reflux disease)   . Paroxysmal atrial fibrillation (HCC)     a. on amiodarone, digoxin and Eliquis  . Adenocarcinoma of rectum ([place])     a. 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 Prostate cancer ([place])     a. s/p seed implants with chemo and radiation  . TIA (transient ischemic attack)   . OSA on CPAP   . HLD (hyperlipidemia)   . Orthostatic hypotension   . Dizziness     a. chronic. Admission for this 07/18/2014  . Ischemic cardiomyopathy     EF 18% by nuclear study 2016, multiple myocardial infarctions in past    . Chronic kidney disease, stage IV (severe) ([place])   . DM type 2, uncontrolled, with renal complications ([place])   . Obesity   . Hematuria   . HCAP (healthcare-associated pneumonia) 07/21/2015  . History of blood transfusion     "I've had 2 units so far this year" (07/21/2015)  . Anemia   . SAH (subarachnoid hemorrhage) ([place])     post-traumatic (fall) [facility] 12/2014  . PONV (postoperative nausea and vomiting)     Past Surgical History  Procedure Laterality Date  . Internal defibrillator and pacemaker  2002  . Abdominal and perineal resection of rectum  with total mesorectal excision  10/04/2007  . Colonoscopy  09/14/2011    Dr. Gala Romney: via colostomy, Single pedunculated benign inflammatory polyp. Due for surveillance Oct 2015  . Colostomy  2008  . Colonoscopy N/A 07/02/2014    Procedure: COLONOSCOPY;  Surgeon: Daneil Dolin, MD;  [facility];  Service: Endoscopy;  Laterality: N/A;  7:30 / COLONOSCOPY THRU COLOSTOMY  . Esophagogastroduodenoscopy N/A 07/02/2014    Procedure: ESOPHAGOGASTRODUODENOSCOPY (EGD);   Surgeon: Daneil Dolin, MD;  [facility];  Service: Endoscopy;  Laterality: N/A;  7:30  . Savory dilation N/A 07/02/2014    Procedure: SAVORY DILATION;  Surgeon: Daneil Dolin, MD;  [facility];  Service: Endoscopy;  Laterality: N/A;  7:30  . Maloney dilation N/A 07/02/2014    Procedure: Venia Minks DILATION;  Surgeon: Daneil Dolin, MD;  [facility];  Service: Endoscopy;  Laterality: N/A;  7:30  . Portacath placement  06/2007    "removed ~ 1 yr later"  . Left heart catheterization with coronary angiogram N/A 07/13/2013    Procedure: LEFT HEART CATHETERIZATION WITH CORONARY ANGIOGRAM;  Surgeon: Lorretta Harp, MD;  Location: [facility] CATH LAB;  Service: Cardiovascular;  Laterality: N/A;  . Colonoscopy N/A 12/11/2014    Dr. Gala Romney via colostomy. Normal. Repeat in 2021.   Marland Kitchen Esophagogastroduodenoscopy N/A 12/11/2014    XBM:WUXLKG EGD  . Cardiac defibrillator placement  2009    Upgraded to a BiV ICD  . Right heart catheterization N/A 02/24/2015    Procedure: RIGHT HEART CATH;  Surgeon: Jolaine Artist, MD;  Location: [facility] CATH LAB;  Service: Cardiovascular;  Laterality: N/A;  . Ep implantable device N/A 04/10/2015    Procedure: Ppm/Biv Ppm Generator Changeout;  Surgeon: Evans Lance, MD;  Location: [place] INVASIVE CV LAB CUPID;  Service: Cardiovascular;  Laterality: N/A;  . Bi-ventricular implantable cardioverter defibrillator  (crt-d)  2009  . Cardiac catheterization  08/2001; 2009    ; /notes 07/10/2013  . Coronary angioplasty with stent placement  2001; ~ 2006    "1 + 1"   . Givens capsule study N/A 07/23/2015    Procedure: GIVENS CAPSULE STUDY;  Surgeon: Wilford Corner, MD;  Location: [facility] ENDOSCOPY;  Service: Endoscopy;  Laterality: N/A;  . Colonoscopy N/A 08/24/2015    Procedure: COLONOSCOPY;  Surgeon: Manus Gunning, MD;  Location: [place];  Service: Gastroenterology;  Laterality: N/A;  . Electrophysiologic study N/A 08/28/2015    Procedure: AV Node Ablation;   Surgeon: Will Meredith Leeds, MD;  Location: [place] CV LAB;  Service: Cardiovascular;  Laterality: N/A;  . Av fistula placement Right 09/16/2015    Procedure: ARTERIOVENOUS (AV) FISTULA CREATION - BRACHIOCEPHALIC;  Surgeon: Elam Dutch, MD;  Location: [facility] OR;  Service: Vascular;  Laterality: Right;    Current Outpatient Prescriptions  Medication Sig Dispense Refill  . apixaban (ELIQUIS) 2.5 MG TABS tablet Take 1 tablet (2.5 mg total) by mouth 2 (two) times daily. 60 tablet 6  . atorvastatin (LIPITOR) 80 MG tablet Take 1 tablet (80 mg total) by mouth daily. 30 tablet 6  . co-enzyme Q-10 50 MG capsule Take 50 mg by mouth every morning.     . ferrous sulfate 325 (65 FE) MG tablet Take 1 tablet (325 mg total) by mouth 2 (two) times daily with a meal. 60 tablet 0  . fluticasone (FLONASE) 50 MCG/ACT nasal spray Place 1 spray into both nostrils daily as needed for allergies.     . furosemide (  LASIX) 40 MG tablet Take 3 tabs in AM and 2 tabs in PM (Patient taking differently: Take 80-120 mg by mouth See admin instructions. Take 3 tabs in AM and 2 tabs in PM) 150 tablet 3  . Insulin Glargine (TOUJEO SOLOSTAR) 300 UNIT/ML SOPN Inject 20 Units into the skin every morning.    . metoprolol succinate (TOPROL-XL) 25 MG 24 hr tablet Take 0.5 tablets (12.5 mg total) by mouth 2 (two) times daily. 45 tablet 3  . Omega-3 Fatty Acids (FISH OIL) 1000 MG CAPS Take 1,000 mg by mouth 2 (two) times daily.    . pantoprazole (PROTONIX) 40 MG tablet Take 1 tablet (40 mg total) by mouth daily. 30 tablet 0  . PROAIR RESPICLICK 295 (90 BASE) MCG/ACT AEPB Inhale 2 puffs into the lungs every 6 (six) hours as needed (for breathing).   0  . acetaminophen-codeine (TYLENOL #3) 300-30 MG tablet Take 1 tablet by mouth every 4 (four) hours as needed for moderate pain. (Patient not taking: Reported on 09/24/2015) 10 tablet 0  . dextromethorphan-guaiFENesin (MUCINEX DM) 30-600 MG per 12 hr tablet Take 1 tablet by mouth 2 (two)  times daily as needed for cough. (Patient not taking: Reported on 09/24/2015)     No current facility-administered medications for this visit.    Allergies as of 09/24/2015  . (No Known Allergies)    Family History  Problem Relation Age of Onset  . Colon cancer Mother 5  . Colon cancer Sister 40  . Coronary artery disease Father   . Colon cancer Other     2 cousins, succumbed to illness    Social History   Social History  . Marital Status: Married    Spouse Name: N/A  . Number of Children: 1  . Years of Education: N/A   Occupational History  .     Social History Main Topics  . Smoking status: Never Smoker   . Smokeless tobacco: Never Used  . Alcohol Use: No     Comment: Former user 45 years ago  . Drug Use: No  . Sexual Activity: No   Other Topics Concern  . None   Social History Narrative   Married 44 years   1 son, 2 grandkids    Denies alcohol, drugs, tobacco     Review of Systems: General: Negative for anorexia, weight loss, fever, chills, fatigue, weakness. Eyes: Negative for vision changes.  ENT: Negative for hoarseness, difficulty swallowing. CV: Negative for chest pain, angina, palpitations.  Respiratory: Negative for dyspnea at rest, sputum.  GI: See history of present illness. Psych: Negative for anxiety, depression, suicidal ideation, hallucinations.  Endo: Negative for unusual weight change.    Physical Exam: BP 101/63 mmHg  Pulse 81  Temp(Src) 97.6 F (36.4 C) (Oral)  Ht 5\' 10"  (1.778 m)  Wt 227 lb 9.6 oz (103.239 kg)  BMI 32.66 kg/m2 General:   Alert and oriented. Pleasant and cooperative. Well-nourished and well-developed.  Head:  Normocephalic and atraumatic. Eyes:  Without icterus, sclera clear and conjunctiva pink.  Ears:  Normal auditory acuity. Cardiovascular:  S1, S2 present without murmurs appreciated. Normal pulses noted. Extremities without clubbing or edema. Respiratory:  Clear to auscultation bilaterally. No wheezes,  rales, or rhonchi. No distress.  Gastrointestinal:  +BS, rounded but soft, non-tender and non-distended. No HSM noted. No guarding or rebound. No masses appreciated.  Rectal:  Deferred  Skin:  Intact without significant lesions or rashes. Skin surrounding colostomy bag is intact without breakdown, no ostomy  bag blood noted. Surgical scar to right [facility] space s/p AV fistula placement, well healed. Neurologic:  Alert and oriented x4;  grossly normal neurologically. Psych:  Alert and cooperative. Normal mood and affect. Heme/Lymph/Immune: No excessive bruising noted.    09/24/2015 8:18 AM

## 2015-09-24 NOTE — Assessment & Plan Note (Signed)
Patient with abdominal history of bleeding from the stoma site on Eloquis anticoagulation. His dose was decreased to 2.5 mg. He has had one recent episode stomal bleeding, however last time he was seen the surgeon gave him instructions on how to try to stop the bleeding himself which she was able to successfully do. Stomal site looks good today, no blood noted in the bag or around the site. Return for follow-up in 6 months.

## 2015-09-24 NOTE — Assessment & Plan Note (Signed)
Symptoms essentially resolved. Return for follow-up in 6 months.

## 2015-09-25 NOTE — Telephone Encounter (Signed)
I think he can continue the Lasix as the kidney doctor recommended but will need a BMET next week.

## 2015-09-25 NOTE — Telephone Encounter (Signed)
Wife called back this morning. Wife said he saw the kidney doctor yesterday and he suggested that he take 4 lasix (40 mg each) in the morning and 3 in the PM. Patient took an additional lasix 40 mg last evening Patients weight is down 2 of the 3 lbs of weight gain this morning.  Kidney doctor wanted him to check with the cardiologist about lasix dosing

## 2015-09-26 ENCOUNTER — Inpatient Hospital Stay (HOSPITAL_COMMUNITY)
Admission: AD | Admit: 2015-09-26 | Discharge: 2015-10-01 | DRG: 291 | Disposition: A | Discharge status: Home / Self Care | Payer: Medicare Other | Source: Ambulatory Visit | Attending: Internal Medicine | Admitting: Internal Medicine

## 2015-09-26 ENCOUNTER — Other Ambulatory Visit (HOSPITAL_COMMUNITY): Payer: Self-pay | Admitting: Internal Medicine

## 2015-09-26 ENCOUNTER — Ambulatory Visit (HOSPITAL_COMMUNITY)
Admission: RE | Admit: 2015-09-26 | Discharge: 2015-09-26 | Disposition: A | Discharge status: Home / Self Care | Payer: Medicare Other | Source: Ambulatory Visit | Attending: Internal Medicine | Admitting: Internal Medicine

## 2015-09-26 DIAGNOSIS — E876 Hypokalemia: Secondary | ICD-10-CM | POA: Diagnosis present

## 2015-09-26 DIAGNOSIS — Z23 Encounter for immunization: Secondary | ICD-10-CM | POA: Diagnosis not present

## 2015-09-26 DIAGNOSIS — R188 Other ascites: Secondary | ICD-10-CM | POA: Diagnosis present

## 2015-09-26 DIAGNOSIS — Z8673 Personal history of transient ischemic attack (TIA), and cerebral infarction without residual deficits: Secondary | ICD-10-CM

## 2015-09-26 DIAGNOSIS — Z9221 Personal history of antineoplastic chemotherapy: Secondary | ICD-10-CM

## 2015-09-26 DIAGNOSIS — I428 Other cardiomyopathies: Secondary | ICD-10-CM | POA: Diagnosis not present

## 2015-09-26 DIAGNOSIS — Z85048 Personal history of other malignant neoplasm of rectum, rectosigmoid junction, and anus: Secondary | ICD-10-CM | POA: Diagnosis not present

## 2015-09-26 DIAGNOSIS — Z933 Colostomy status: Secondary | ICD-10-CM | POA: Diagnosis not present

## 2015-09-26 DIAGNOSIS — I2510 Atherosclerotic heart disease of native coronary artery without angina pectoris: Secondary | ICD-10-CM | POA: Diagnosis present

## 2015-09-26 DIAGNOSIS — Z950 Presence of cardiac pacemaker: Secondary | ICD-10-CM

## 2015-09-26 DIAGNOSIS — N184 Chronic kidney disease, stage 4 (severe): Secondary | ICD-10-CM | POA: Diagnosis present

## 2015-09-26 DIAGNOSIS — Z1389 Encounter for screening for other disorder: Secondary | ICD-10-CM | POA: Diagnosis not present

## 2015-09-26 DIAGNOSIS — I4891 Unspecified atrial fibrillation: Secondary | ICD-10-CM | POA: Diagnosis not present

## 2015-09-26 DIAGNOSIS — R05 Cough: Secondary | ICD-10-CM

## 2015-09-26 DIAGNOSIS — I482 Chronic atrial fibrillation: Secondary | ICD-10-CM | POA: Diagnosis not present

## 2015-09-26 DIAGNOSIS — I5023 Acute on chronic systolic (congestive) heart failure: Secondary | ICD-10-CM | POA: Diagnosis present

## 2015-09-26 DIAGNOSIS — E8770 Fluid overload, unspecified: Secondary | ICD-10-CM | POA: Diagnosis not present

## 2015-09-26 DIAGNOSIS — I480 Paroxysmal atrial fibrillation: Secondary | ICD-10-CM | POA: Diagnosis present

## 2015-09-26 DIAGNOSIS — I252 Old myocardial infarction: Secondary | ICD-10-CM

## 2015-09-26 DIAGNOSIS — E785 Hyperlipidemia, unspecified: Secondary | ICD-10-CM | POA: Diagnosis present

## 2015-09-26 DIAGNOSIS — I130 Hypertensive heart and chronic kidney disease with heart failure and stage 1 through stage 4 chronic kidney disease, or unspecified chronic kidney disease: Secondary | ICD-10-CM | POA: Diagnosis present

## 2015-09-26 DIAGNOSIS — R0600 Dyspnea, unspecified: Secondary | ICD-10-CM

## 2015-09-26 DIAGNOSIS — Z923 Personal history of irradiation: Secondary | ICD-10-CM | POA: Diagnosis not present

## 2015-09-26 DIAGNOSIS — E1140 Type 2 diabetes mellitus with diabetic neuropathy, unspecified: Secondary | ICD-10-CM | POA: Diagnosis not present

## 2015-09-26 DIAGNOSIS — Z794 Long term (current) use of insulin: Secondary | ICD-10-CM | POA: Diagnosis not present

## 2015-09-26 DIAGNOSIS — Z7901 Long term (current) use of anticoagulants: Secondary | ICD-10-CM | POA: Diagnosis not present

## 2015-09-26 DIAGNOSIS — I255 Ischemic cardiomyopathy: Secondary | ICD-10-CM | POA: Diagnosis present

## 2015-09-26 DIAGNOSIS — N179 Acute kidney failure, unspecified: Secondary | ICD-10-CM | POA: Diagnosis not present

## 2015-09-26 DIAGNOSIS — E1122 Type 2 diabetes mellitus with diabetic chronic kidney disease: Secondary | ICD-10-CM | POA: Diagnosis present

## 2015-09-26 DIAGNOSIS — N189 Chronic kidney disease, unspecified: Secondary | ICD-10-CM | POA: Diagnosis not present

## 2015-09-26 DIAGNOSIS — I509 Heart failure, unspecified: Secondary | ICD-10-CM | POA: Diagnosis not present

## 2015-09-26 DIAGNOSIS — Z6834 Body mass index (BMI) 34.0-34.9, adult: Secondary | ICD-10-CM | POA: Diagnosis not present

## 2015-09-26 DIAGNOSIS — G4733 Obstructive sleep apnea (adult) (pediatric): Secondary | ICD-10-CM | POA: Diagnosis present

## 2015-09-26 DIAGNOSIS — I50810 Right heart failure, unspecified: Secondary | ICD-10-CM

## 2015-09-26 DIAGNOSIS — R059 Cough, unspecified: Secondary | ICD-10-CM

## 2015-09-26 DIAGNOSIS — R609 Edema, unspecified: Secondary | ICD-10-CM | POA: Diagnosis not present

## 2015-09-26 DIAGNOSIS — I5022 Chronic systolic (congestive) heart failure: Secondary | ICD-10-CM | POA: Diagnosis not present

## 2015-09-26 HISTORY — DX: Colostomy status: Z93.3

## 2015-09-26 HISTORY — DX: Acute myocardial infarction, unspecified: I21.9

## 2015-09-26 LAB — CBC WITH DIFFERENTIAL/PLATELET
BASOS ABS: 0 10*3/uL (ref 0.0–0.1)
BASOS PCT: 1 %
EOS ABS: 0.1 10*3/uL (ref 0.0–0.7)
EOS PCT: 1 %
HEMATOCRIT: 31.5 % — AB (ref 39.0–52.0)
Hemoglobin: 10.1 g/dL — ABNORMAL LOW (ref 13.0–17.0)
Lymphocytes Relative: 9 %
Lymphs Abs: 0.7 10*3/uL (ref 0.7–4.0)
MCH: 31.8 pg (ref 26.0–34.0)
MCHC: 32.1 g/dL (ref 30.0–36.0)
MCV: 99.1 fL (ref 78.0–100.0)
MONO ABS: 0.5 10*3/uL (ref 0.1–1.0)
MONOS PCT: 6 %
NEUTROS ABS: 6.3 10*3/uL (ref 1.7–7.7)
Neutrophils Relative %: 83 %
PLATELETS: 131 10*3/uL — AB (ref 150–400)
RBC: 3.18 MIL/uL — ABNORMAL LOW (ref 4.22–5.81)
RDW: 15.5 % (ref 11.5–15.5)
WBC: 7.5 10*3/uL (ref 4.0–10.5)

## 2015-09-26 LAB — COMPREHENSIVE METABOLIC PANEL
ALBUMIN: 2.6 g/dL — AB (ref 3.5–5.0)
ALT: 14 U/L — ABNORMAL LOW (ref 17–63)
ANION GAP: 10 (ref 5–15)
AST: 35 U/L (ref 15–41)
Alkaline Phosphatase: 82 U/L (ref 38–126)
BILIRUBIN TOTAL: 2.4 mg/dL — AB (ref 0.3–1.2)
BUN: 32 mg/dL — ABNORMAL HIGH (ref 6–20)
CHLORIDE: 97 mmol/L — AB (ref 101–111)
CO2: 28 mmol/L (ref 22–32)
Calcium: 9.1 mg/dL (ref 8.9–10.3)
Creatinine, Ser: 2.72 mg/dL — ABNORMAL HIGH (ref 0.61–1.24)
GFR calc Af Amer: 27 mL/min — ABNORMAL LOW (ref >60)
GFR calc non Af Amer: 24 mL/min — ABNORMAL LOW (ref >60)
GLUCOSE: 222 mg/dL — AB (ref 65–99)
POTASSIUM: 3.2 mmol/L — AB (ref 3.5–5.1)
Sodium: 135 mmol/L (ref 135–145)
TOTAL PROTEIN: 6.8 g/dL (ref 6.5–8.1)

## 2015-09-26 LAB — TROPONIN I
TROPONIN I: 0.04 ng/mL — AB (ref <0.031)
TROPONIN I: 0.05 ng/mL — AB (ref <0.031)

## 2015-09-26 LAB — BRAIN NATRIURETIC PEPTIDE: B Natriuretic Peptide: 1393.4 pg/mL — ABNORMAL HIGH (ref 0.0–100.0)

## 2015-09-26 LAB — GLUCOSE, CAPILLARY
Glucose-Capillary: 219 mg/dL — ABNORMAL HIGH (ref 65–99)
Glucose-Capillary: 231 mg/dL — ABNORMAL HIGH (ref 65–99)

## 2015-09-26 LAB — PROTIME-INR
INR: 1.59 — ABNORMAL HIGH (ref 0.00–1.49)
PROTHROMBIN TIME: 19 s — AB (ref 11.6–15.2)

## 2015-09-26 MED ORDER — CO-ENZYME Q-10 50 MG PO CAPS: 50.0000 mg | ORAL_CAPSULE | Freq: Every morning | ORAL | Status: DC

## 2015-09-26 MED ORDER — VANCOMYCIN HCL 10 G IV SOLR
2000.0000 mg | Freq: Once | INTRAVENOUS | Status: AC
  Administered 2015-09-26: 2000 mg via INTRAVENOUS
  Filled 2015-09-26: qty 2000

## 2015-09-26 MED ORDER — FERROUS SULFATE 325 (65 FE) MG PO TABS
325.0000 mg | ORAL_TABLET | Freq: Two times a day (BID) | ORAL | Status: DC
  Administered 2015-09-27 – 2015-10-01 (×9): 325 mg via ORAL
  Filled 2015-09-26 (×9): qty 1

## 2015-09-26 MED ORDER — OMEGA-3-ACID ETHYL ESTERS 1 G PO CAPS
1.0000 g | ORAL_CAPSULE | Freq: Two times a day (BID) | ORAL | Status: DC
  Administered 2015-09-26 – 2015-10-01 (×10): 1 g via ORAL
  Filled 2015-09-26 (×10): qty 1

## 2015-09-26 MED ORDER — SODIUM CHLORIDE 0.9 % IJ SOLN
3.0000 mL | Freq: Two times a day (BID) | INTRAMUSCULAR | Status: DC
  Administered 2015-09-26 – 2015-09-30 (×7): 3 mL via INTRAVENOUS

## 2015-09-26 MED ORDER — FLUTICASONE PROPIONATE 50 MCG/ACT NA SUSP
1.0000 | Freq: Every day | NASAL | Status: DC | PRN
  Filled 2015-09-26: qty 16

## 2015-09-26 MED ORDER — METOPROLOL SUCCINATE ER 25 MG PO TB24
12.5000 mg | ORAL_TABLET | Freq: Two times a day (BID) | ORAL | Status: DC
  Administered 2015-09-26 – 2015-09-27 (×2): 12.5 mg via ORAL
  Filled 2015-09-26 (×4): qty 1

## 2015-09-26 MED ORDER — INSULIN GLARGINE 300 UNIT/ML ~~LOC~~ SOPN: 20.0000 [IU] | PEN_INJECTOR | Freq: Every morning | SUBCUTANEOUS | Status: DC

## 2015-09-26 MED ORDER — POTASSIUM CHLORIDE CRYS ER 20 MEQ PO TBCR
40.0000 meq | EXTENDED_RELEASE_TABLET | Freq: Two times a day (BID) | ORAL | Status: DC
  Administered 2015-09-26 – 2015-09-30 (×9): 40 meq via ORAL
  Filled 2015-09-26 (×11): qty 2

## 2015-09-26 MED ORDER — ONDANSETRON HCL 4 MG/2ML IJ SOLN: 4.0000 mg | Freq: Four times a day (QID) | INTRAMUSCULAR | Status: DC | PRN

## 2015-09-26 MED ORDER — FERROUS SULFATE 325 (65 FE) MG PO TABS
325.0000 mg | ORAL_TABLET | Freq: Two times a day (BID) | ORAL | Status: DC
  Administered 2015-09-26: 325 mg via ORAL
  Filled 2015-09-26: qty 1

## 2015-09-26 MED ORDER — FUROSEMIDE 10 MG/ML IJ SOLN
120.0000 mg | Freq: Two times a day (BID) | INTRAVENOUS | Status: DC
  Administered 2015-09-26 – 2015-09-29 (×7): 120 mg via INTRAVENOUS
  Filled 2015-09-26 (×8): qty 12

## 2015-09-26 MED ORDER — PANTOPRAZOLE SODIUM 40 MG PO TBEC
40.0000 mg | DELAYED_RELEASE_TABLET | Freq: Every day | ORAL | Status: DC
  Administered 2015-09-27 – 2015-10-01 (×5): 40 mg via ORAL
  Filled 2015-09-26 (×5): qty 1

## 2015-09-26 MED ORDER — FUROSEMIDE 40 MG PO TABS: ORAL_TABLET | ORAL | Status: DC

## 2015-09-26 MED ORDER — APIXABAN 2.5 MG PO TABS
2.5000 mg | ORAL_TABLET | Freq: Two times a day (BID) | ORAL | Status: DC
  Administered 2015-09-26 – 2015-10-01 (×10): 2.5 mg via ORAL
  Filled 2015-09-26 (×10): qty 1

## 2015-09-26 MED ORDER — FISH OIL 1000 MG PO CAPS: 1000.0000 mg | ORAL_CAPSULE | Freq: Two times a day (BID) | ORAL | Status: DC

## 2015-09-26 MED ORDER — METOLAZONE 5 MG PO TABS
5.0000 mg | ORAL_TABLET | Freq: Every day | ORAL | Status: DC
  Administered 2015-09-26: 5 mg via ORAL
  Filled 2015-09-26: qty 1

## 2015-09-26 MED ORDER — SODIUM CHLORIDE 0.9 % IV SOLN
250.0000 mL | INTRAVENOUS | Status: DC | PRN
Start: 2015-09-26 — End: 2015-10-01

## 2015-09-26 MED ORDER — ALBUTEROL SULFATE (2.5 MG/3ML) 0.083% IN NEBU: 2.5000 mg | INHALATION_SOLUTION | Freq: Four times a day (QID) | RESPIRATORY_TRACT | Status: DC | PRN

## 2015-09-26 MED ORDER — CEPHALEXIN 500 MG PO CAPS
500.0000 mg | ORAL_CAPSULE | Freq: Three times a day (TID) | ORAL | Status: DC
Start: 2015-09-26 — End: 2015-09-26

## 2015-09-26 MED ORDER — ATORVASTATIN CALCIUM 80 MG PO TABS
80.0000 mg | ORAL_TABLET | Freq: Every day | ORAL | Status: DC
  Administered 2015-09-26 – 2015-09-30 (×5): 80 mg via ORAL
  Filled 2015-09-26 (×5): qty 1

## 2015-09-26 MED ORDER — VANCOMYCIN HCL 10 G IV SOLR
1500.0000 mg | INTRAVENOUS | Status: DC
  Administered 2015-09-28 – 2015-09-30 (×2): 1500 mg via INTRAVENOUS
  Filled 2015-09-26 (×2): qty 1500

## 2015-09-26 MED ORDER — ALBUTEROL SULFATE 108 (90 BASE) MCG/ACT IN AEPB: 2.0000 | INHALATION_SPRAY | Freq: Four times a day (QID) | RESPIRATORY_TRACT | Status: DC | PRN

## 2015-09-26 MED ORDER — INSULIN GLARGINE 100 UNIT/ML ~~LOC~~ SOLN
20.0000 [IU] | Freq: Every day | SUBCUTANEOUS | Status: DC
  Administered 2015-09-26 – 2015-10-01 (×6): 20 [IU] via SUBCUTANEOUS
  Filled 2015-09-26 (×7): qty 0.2

## 2015-09-26 MED ORDER — SODIUM CHLORIDE 0.9 % IJ SOLN
3.0000 mL | INTRAMUSCULAR | Status: DC | PRN
  Administered 2015-09-29: 3 mL via INTRAVENOUS
  Filled 2015-09-26: qty 3

## 2015-09-26 MED ORDER — ACETAMINOPHEN 325 MG PO TABS: 650.0000 mg | ORAL_TABLET | ORAL | Status: DC | PRN

## 2015-09-26 NOTE — Progress Notes (Signed)
CCMD called to confirm [name] on telemetry box 07.  Patient has pacemaker.  CSN number and birthday confirmed.

## 2015-09-26 NOTE — Progress Notes (Signed)
ANTIBIOTIC CONSULT NOTE - INITIAL  Pharmacy Consult for vancomycin Indication: infected AV fistula  No Known Allergies  Patient Measurements: Height: 5\' 10"  (177.8 cm) Weight: 225 lb 14.4 oz (102.468 kg) IBW/kg (Calculated) : 73   Vital Signs: Temp: 97.9 F (36.6 C) (10/21 1611) Temp Source: Oral (10/21 1611) BP: 97/54 mmHg (10/21 1611) Pulse Rate: 72 (10/21 1611) Intake/Output from previous day:   Intake/Output from this shift:    Labs: No results for input(s): WBC, HGB, PLT, LABCREA, CREATININE in the last 72 hours. CrCl cannot be calculated (Patient has no serum creatinine result on file.). No results for input(s): VANCOTROUGH, VANCOPEAK, VANCORANDOM, GENTTROUGH, GENTPEAK, GENTRANDOM, TOBRATROUGH, TOBRAPEAK, TOBRARND, AMIKACINPEAK, AMIKACINTROU, AMIKACIN in the last 72 hours.   Microbiology: No results found for this or any previous visit (from the past 720 hour(s)).  Medical History: Past Medical History  Diagnosis Date  . Essential hypertension, benign   . CAD (coronary artery disease)     a. BMS [facility] LAD 2001 at [facility] b. PTCA/atherectomy ramus and BMS to LAD 2009  . Chronic systolic heart failure ([place])   . GERD (gastroesophageal reflux disease)   . Paroxysmal atrial fibrillation (HCC)     a. on amiodarone, digoxin and Eliquis  . Adenocarcinoma of rectum ([place])     a. 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 Prostate cancer ([place])     a. s/p seed implants with chemo and radiation  . TIA (transient ischemic attack)   . OSA on CPAP   . HLD (hyperlipidemia)   . Orthostatic hypotension   . Dizziness     a. chronic. Admission for this 07/18/2014  . Ischemic cardiomyopathy     EF 18% by nuclear study 2016, multiple myocardial infarctions in past    . Chronic kidney disease, stage IV (severe) ([place])   . DM type 2, uncontrolled, with renal complications ([place])   . Obesity   . Hematuria   . HCAP (healthcare-associated pneumonia) 07/21/2015  . History of blood transfusion     "I've had 2 units  so far this year" (07/21/2015)  . Anemia   . SAH (subarachnoid hemorrhage) ([place])     post-traumatic (fall) [facility] 12/2014  . PONV (postoperative nausea and vomiting)     Medications:  Prescriptions prior to admission  Medication Sig Dispense Refill Last Dose  . apixaban (ELIQUIS) 2.5 MG TABS tablet Take 1 tablet (2.5 mg total) by mouth 2 (two) times daily. 60 tablet 6 Taking  . atorvastatin (LIPITOR) 80 MG tablet Take 1 tablet (80 mg total) by mouth daily. 30 tablet 6 Taking  . co-enzyme Q-10 50 MG capsule Take 50 mg by mouth every morning.    Taking  . ferrous sulfate 325 (65 FE) MG tablet Take 1 tablet (325 mg total) by mouth 2 (two) times daily with a meal. 60 tablet 0 Taking  . fluticasone (FLONASE) 50 MCG/ACT nasal spray Place 1 spray into both nostrils daily as needed for allergies.    Taking  . furosemide (LASIX) 40 MG tablet Take 4 tabs in AM and 3 tabs in PM 210 tablet 3   . Insulin Glargine (TOUJEO SOLOSTAR) 300 UNIT/ML SOPN Inject 20 Units into the skin every morning.   Taking  . metoprolol succinate (TOPROL-XL) 25 MG 24 hr tablet Take 0.5 tablets (12.5 mg total) by mouth 2 (two) times daily. 45 tablet 3 Taking  . Omega-3 Fatty Acids (FISH OIL) 1000 MG CAPS Take 1,000 mg by mouth 2 (two) times daily.   Taking  . pantoprazole (  PROTONIX) 40 MG tablet Take 1 tablet (40 mg total) by mouth daily. 30 tablet 0 Taking  . PROAIR RESPICLICK 014 (90 BASE) MCG/ACT AEPB Inhale 2 puffs into the lungs every 6 (six) hours as needed (for breathing).   0 Taking   Assessment: 59 yo M admitted with CHF exacerbation.   Last Tuesday had R arm AV fistula placed.  Suture came off and looks purulent per consult message. Per admission note RUE AVF site split open.  He is not yet on HD.  Wt 102.5 kg; AF. WBC 7.5, creat 2.72. Creat cl ~ 29 ml/min  Goal of Therapy:  Vancomycin trough level 10-15 mcg/ml  Plan:  -vancomycin 2 gm loading dose - vancomycin 1500 mg IV q48h -f/u renal fxn, wbc, temp, culture  data - vanc level as needed  Eudelia Bunch, Pharm.D. [phone] 09/26/2015 7:33 PM

## 2015-09-26 NOTE — H&P (Signed)
Patient ID: [name] MRN: [id number], DOB/AGE: 1956/08/23   Admit date: 09/26/2015   Primary Physician: Cassell Smiles., MD Primary Cardiologist: Dr. Gala Romney Primary electrophysiologist: Dr. Ladona Ridgel  Pt. Profile:   59 year old Caucasian male with past medical history of CAD with remote anterior MI status post BMS 2, ICM with baseline EF 20% s/p St Jude CRT-D with device change in May 2016, history of PAF s/p AV nodal ablation, CKD stage IV, traumatic SAH 12/2014 and OSA on CPAP presented with fluid overload  Problem List  Past Medical History  Diagnosis Date  . Essential hypertension, benign   . CAD (coronary artery disease)     a. BMS to LAD 2001 at [facility] b. PTCA/atherectomy ramus and BMS to LAD 2009  . Chronic systolic heart failure (HCC)   . GERD (gastroesophageal reflux disease)   . Paroxysmal atrial fibrillation (HCC)     a. on amiodarone, digoxin and Eliquis  . Adenocarcinoma of rectum (HCC)     a. 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 Prostate cancer (HCC)     a. s/p seed implants with chemo and radiation  . TIA (transient ischemic attack)   . OSA on CPAP   . HLD (hyperlipidemia)   . Orthostatic hypotension   . Dizziness     a. chronic. Admission for this 07/18/2014  . Ischemic cardiomyopathy     EF 18% by nuclear study 2016, multiple myocardial infarctions in past    . Chronic kidney disease, stage IV (severe) (HCC)   . DM type 2, uncontrolled, with renal complications (HCC)   . Obesity   . Hematuria   . HCAP (healthcare-associated pneumonia) 07/21/2015  . History of blood transfusion     "I've had 2 units so far this year" (07/21/2015)  . Anemia   . SAH (subarachnoid hemorrhage) (HCC)     post-traumatic (fall) [facility] 12/2014  . PONV (postoperative nausea and vomiting)     Past Surgical History  Procedure Laterality Date  . Internal defibrillator and pacemaker  2002  . Abdominal and perineal resection of rectum with total mesorectal excision  10/04/2007  . Colonoscopy   09/14/2011    Dr. Jena Gauss: via colostomy, Single pedunculated benign inflammatory polyp. Due for surveillance Oct 2015  . Colostomy  2008  . Colonoscopy N/A 07/02/2014    Procedure: COLONOSCOPY;  Surgeon: Corbin Ade, MD;  Location: [facility] ENDO SU[facility];  Service: Endoscopy;  Laterality: N/A;  7:30 / COLONOSCOPY THRU COLOSTOMY  . Esophagogastroduodenoscopy N/A 07/02/2014    Procedure: ESOPHAGOGASTRODUODENOSCOPY (EGD);  Surgeon: Corbin Ade, MD;  Location: [facility] SUITE;  Service: Endoscopy;  Laterality: N/A;  7:30  . Savory dilation N/A 07/02/2014    Procedure: SAVORY DILATION;  Surgeon: Corbin Ade, MD;  Location: [facility] END[facility];  Service: Endoscopy;  Laterality: N/A;  7:30  . Maloney dilation N/A 07/02/2014    Procedure: Elease Hashimoto DILATION;  Surgeon: Corbin Ade, MD;  Location: [facility];  Service: Endoscopy;  Laterality: N/A;  7:30  . Portacath placement  06/2007    "removed ~ 1 yr later"  . Left heart catheterization with coronary angiogram N/A 07/13/2013    Procedure: LEFT HEART CATHETERIZATION WITH CORONARY ANGIOGRAM;  Surgeon: [name], MD;  Location: [facility] CATH LAB;  Service: Cardiovascular;  Laterality: N/A;  . Colonoscopy N/A 12/11/2014    Dr. Jena Gauss via colostomy. Normal. Repeat in 2021.   Marland Kitchen Esophagogastroduodenoscopy N/A 12/11/2014    WUJ:[id number] EGD  . Cardiac defibrillator placement  2009  Upgraded to a BiV ICD  . Right heart catheterization N/A 02/24/2015    Procedure: RIGHT HEART CATH;  Surgeon: Dolores Patty, MD;  Location: [facility] CATH LAB;  Service: Cardiovascular;  Laterality: N/A;  . Ep implantable device N/A 04/10/2015    Procedure: Ppm/Biv Ppm Generator Changeout;  Surgeon: Marinus Maw, MD;  Location: [facility] INVASIVE CV LAB CUPID;  Service: Cardiovascular;  Laterality: N/A;  . Bi-ventricular implantable cardioverter defibrillator  (crt-d)  2009  . Cardiac catheterization  08/2001; 2009    ; /notes 07/10/2013  . Coronary angioplasty with stent placement  2001; ~ 2006    "1 +  1"   . Givens capsule study N/A 07/23/2015    Procedure: GIVENS CAPSULE STUDY;  Surgeon: Charlott Rakes, MD;  Location: [facility] ENDOSCOPY;  Service: Endoscopy;  Laterality: N/A;  . Colonoscopy N/A 08/24/2015    Procedure: COLONOSCOPY;  Surgeon: Ruffin Frederick, MD;  Location: [facility] OR;  Service: Gastroenterology;  Laterality: N/A;  . Electrophysiologic study N/A 08/28/2015    Procedure: AV Node Ablation;  Surgeon: Will Jorja Loa, MD;  Location: [facility] INVASIVE CV LAB;  Service: Cardiovascular;  Laterality: N/A;  . Av fistula placement Right 09/16/2015    Procedure: ARTERIOVENOUS (AV) FISTULA CREATION - BRACHIOCEPHALIC;  Surgeon: Sherren Kerns, MD;  Location: [facility] OR;  Service: Vascular;  Laterality: Right;     Allergies  No Known Allergies  HPI  The patient is a 59 year old Caucasian male with past medical history of CAD with remote anterior MI status post BMS 2, ICM with baseline EF 20% s/p St Jude CRT-D with device change in May 2016, history of PAF s/p AV nodal ablation, CKD stage IV, traumatic SAH 12/2014 and OSA on CPAP. Patient was initially admitted in March 2016 from [facility] ED with exertional dyspnea and volume overload, he had a cardiac arrest with ventricular fibrillation in the hospital which was terminated by his ICD. Echocardiogram obtained in March 2016 showed EF 25-30%. Echocardiogram done at the time showed EF 18%, areas of scar but no ischemia. He was admitted in April 2016 with elevated LFT and hematuria, there was question of amiodarone toxicity and was stopped. He was later restarted on amiodarone briefly, this was eventually stopped after he underwent AV nodal ablation procedure. He was admitted in May 2016 and underwent elective BiV ICD generator change which went without complication. He was ruled in as NSTEMI, however given his stage IV CKD, another Myoview was done which showed no significant change when compared to the prior study.  For the past month, he has been  noticing increasing lower extremity edema and increasing abdominal girth. For the past week, he has been having increasing cough. Last night, he was unable to go to sleep due to the cough. Interestingly, despite the cough, he was not having significant worsening of shortness of breath. He denies obvious paroxysmal nocturnal dyspnea. He attempted to contact the office as his weight has increased 20 pounds since early September. He eventually sought medical attention with his PCPs office who felt he was significantly volume overloaded. A chest x-ray was obtained at [name] which did not reveal acute process other than mild pleural effusion. Per patient, last Tuesday, he underwent right arm AV fistula placement by Dr. Darrick Penna, the suture came off and he had some mild drainage, it is no longer draining. His nephrologist placed him on 2 antibiotics: one was amoxicillin and ?doxycycline. Given how volume overloaded he was, he was eventually transferred to [facility] for direct admission and IV diuresis.    Home Medications  Prior to Admission medications   Medication Sig Start Date End Date Taking? Authorizing Provider  apixaban (ELIQUIS) 2.5 MG TABS tablet Take 1 tablet (2.5 mg total) by mouth 2 (two) times daily. 09/04/15   Laurey Morale, MD  atorvastatin (LIPITOR) 80 MG tablet Take 1 tablet (80 mg total) by mouth daily. 09/04/15   Laurey Morale, MD  co-enzyme Q-10 50 MG capsule Take 50 mg by mouth every morning.     Historical Provider, MD  ferrous sulfate 325 (65 FE) MG tablet Take 1 tablet (325 mg total) by mouth 2 (two) times daily with a meal. 07/24/15   Shanker Levora Dredge, MD  fluticasone (FLONASE) 50 MCG/ACT nasal spray Place 1 spray into both nostrils daily as needed for allergies.  11/21/13   Historical Provider, MD  furosemide (LASIX) 40 MG tablet Take 4 tabs in AM and 3 tabs in PM 09/26/15   Dolores Patty, MD  Insulin Glargine (TOUJEO SOLOSTAR) 300 UNIT/ML SOPN Inject 20  Units into the skin every morning. 08/30/15   Christiane Ha, MD  metoprolol succinate (TOPROL-XL) 25 MG 24 hr tablet Take 0.5 tablets (12.5 mg total) by mouth 2 (two) times daily. 09/08/15   Marinus Maw, MD  Omega-3 Fatty Acids (FISH OIL) 1000 MG CAPS Take 1,000 mg by mouth 2 (two) times daily.    Historical Provider, MD  pantoprazole (PROTONIX) 40 MG tablet Take 1 tablet (40 mg total) by mouth daily. 07/24/15   Shanker Levora Dredge, MD  PROAIR RESPICLICK 108 (90 BASE) MCG/ACT AEPB Inhale 2 puffs into the lungs every 6 (six) hours as needed (for breathing).  08/15/15   Historical Provider, MD    Family History  Family History  Problem Relation Age of Onset  . Colon cancer Mother 59  . Colon cancer Sister 54  . Coronary artery disease Father   . Colon cancer Other     2 cousins, succumbed to illness    Social History  Social History   Social History  . Marital Status: Married    Spouse Name: N/A  . Number of Children: 1  . Years of Education: N/A   Occupational History  .     Social History Main Topics  . Smoking status: Never Smoker   . Smokeless tobacco: Never Used  . Alcohol Use: No     Comment: Former user 45 years ago  . Drug Use: No  . Sexual Activity: No   Other Topics Concern  . Not on file   Social History Narrative   Married 38 years   1 son, 2 grandkids    Denies alcohol, drugs, tobacco      Review of Systems General:  No chills, fever, night sweats or weight changes.  Cardiovascular:  No chest pain, orthopnea, palpitations, paroxysmal nocturnal dyspnea. +dyspnea on exertion, increasing edema x 1 month Dermatological: No rash, lesions/masses Respiratory: No dyspnea +cough Urologic: No hematuria, dysuria Abdominal:   No nausea, vomiting, diarrhea, bright red blood per rectum, melena, or hematemesis +increasing abdominal girth x 1 month Neurologic:  No visual changes, wkns, changes in mental status. All other systems reviewed and are otherwise negative  except as noted above.  Physical Exam  Blood pressure 97/54, pulse 72, temperature 97.9 F (36.6 C), temperature source Oral, resp. rate 16, height 5\' 10"  (1.778 m), weight 225 lb 14.4 oz (102.468 kg), SpO2 100 %.  General: Pleasant,  NAD Psych: Normal affect. Neuro: Alert and oriented X 3. Moves all extremities spontaneously. HEENT: Normal  Neck: Supple without bruits or JVD. Lungs:  Resp regular and unlabored, CTA. Heart: RRR no s3, s4, or murmurs. Abdomen: Soft, non-tender, BS + x 4.  +distended, 1+ pitting edema on the side of abdomen Extremities: No clubbing, cyanosis. DP/PT/Radials 2+ and equal bilaterally. 3+ pitting edema bilateral. RUE AVF suture coming apart, no obvious drainage  Labs  Troponin (Point of Care Test) No results for input(s): TROPIPOC in the last 72 hours. No results for input(s): CKTOTAL, CKMB, TROPONINI in the last 72 hours. Lab Results  Component Value Date   WBC 7.3 09/02/2015   HGB 9.9* 09/16/2015   HCT 29.0* 09/16/2015   MCV 101.0* 09/02/2015   PLT 124* 09/02/2015   No results for input(s): NA, K, CL, CO2, BUN, CREATININE, CALCIUM, PROT, BILITOT, ALKPHOS, ALT, AST, GLUCOSE in the last 168 hours.  Invalid input(s): LABALBU Lab Results  Component Value Date   CHOL 186 11/20/2014   HDL 27* 11/20/2014   LDLCALC 123* 11/20/2014   TRIG 180* 11/20/2014   Lab Results  Component Value Date   DDIMER  02/06/2009    <0.22        AT THE INHOUSE ESTABLISHED CUTOFF VALUE OF 0.48 ug/mL FEU, THIS ASSAY HAS BEEN DOCUMENTED IN THE LITERATURE TO HAVE A SENSITIVITY AND NEGATIVE PREDICTIVE VALUE OF AT LEAST 98 TO 99%.  THE TEST RESULT SHOULD BE CORRELATED WITH AN ASSESSMENT OF THE CLINICAL PROBABILITY OF DVT / VTE.     Radiology/Studies  Dg Chest 2 View  09/26/2015  CLINICAL DATA:  Dyspnea, productive cough. EXAM: CHEST  2 VIEW COMPARISON:  August 02, 2015. FINDINGS: The heart size and mediastinal contours are within normal limits. Both lungs are  clear. No pneumothorax is noted. Minimal bilateral pleural effusions are noted. Left-sided pacemaker is unchanged in position. The visualized skeletal structures are unremarkable. IMPRESSION: Minimal bilateral pleural effusions. No other significant abnormality seen in the chest. Electronically Signed   By: Lupita Raider, M.D.   On: 09/26/2015 14:42    ECG  No new EKG  Echocardiogram 04/24/2015  - Left ventricle: POor acoustic windows limit study even with Definity use . LVEF is approximately is apprxoiamtely 35% with hypokinesis/akinesis of distal 1/3 of LV. The cavity size was severely dilated. Systolic function was normal. Wall motion was normal; there were no regional wall motion abnormalities. - Aortic valve: There was trivial regurgitation. - Mitral valve: There was mild regurgitation. - Left atrium: The atrium was severely dilated. - Right ventricle: Systolic function was mildly reduced. - Pulmonary arteries: PA peak pressure: 55 mm Hg (S).     ASSESSMENT AND PLAN  1. Acute on chronic systolic HF (mainly R heart failure > L)  - despite complaining significant cough for the past week, exam was more consistent with R heart failure with 3+ pitting edema in LE and abdominal distention, lung largely clear without rale.   - discussed with MD, will start 120mg  BID IV lasix with daily metolazone while closely monitoring renal function  - St Jude to interrogated device to see trend of impedence   - he is about 20 lbs over baseline. Likely has ascites, if does not look enough weight, will need paracentesis  2. RUE AVF site split open  - per pt, had AVF placement by Dr. Jettie Booze last Tue  - edges looks red, but redness is not spreading. Will ask VVS to take a look  -  per pt, it has not been draining today, also his nephrologist reportedly started him on amoxicillin and doxy. Discussed with MD, will start IV vanc. Not sure if able to get any wound culture if it is not draining, will  ask nurse to at least give it a try  3. CAD with remote anterior MI status post BMS 2: no angina  4. ICM with baseline EF 20% s/p St Jude CRT-D with device change in May 2016  5. history of PAF s/p AV nodal ablation: pacer dependent  6. CKD stage IV: RUE AVF placed by Dr. Jettie Booze in anticipation of HD, has not started HD yet  7. traumatic [facility] 12/2014   8. OSA on CPAP  9. Rectal CA s/p colostomy x 8 yrs  Signed, Amedeo Plenty 09/26/2015, 5:28 PM  Patient seen and examined with Azalee Course, PA-C. We discussed all aspects of the encounter. I agree with the assessment and plan as stated above.  He is markedly volume overloaded. Will admit for IV diuresis with IV lasix and metolazone. May also need paracentesis. Watch renal function closely. R arm AV fistula wound appears infected. Start vancomycin. I have asked VVS to take a look at it.   Issabelle Mcraney,MD 5:50 PM

## 2015-09-26 NOTE — Progress Notes (Signed)
Pt. Refused cpap. 

## 2015-09-26 NOTE — Telephone Encounter (Signed)
Pt admitted 10/21

## 2015-09-26 NOTE — Telephone Encounter (Signed)
Called and left message for pt to call back.

## 2015-09-26 NOTE — Telephone Encounter (Signed)
Told patient's wife that she may take 4 lasix (40 mg each) and 3 in the PM  Wife will go to [facility] lab to have BMET done next Thursday, October 27th.  Has appointment here on October 28th  Changing medication dose on Lasix in Med profile and ordering with changes in Lasix dosing

## 2015-09-27 ENCOUNTER — Encounter (HOSPITAL_COMMUNITY): Payer: Self-pay | Admitting: General Practice

## 2015-09-27 DIAGNOSIS — N189 Chronic kidney disease, unspecified: Secondary | ICD-10-CM

## 2015-09-27 DIAGNOSIS — N179 Acute kidney failure, unspecified: Secondary | ICD-10-CM

## 2015-09-27 LAB — BASIC METABOLIC PANEL
Anion gap: 13 (ref 5–15)
BUN: 31 mg/dL — AB (ref 6–20)
CALCIUM: 9 mg/dL (ref 8.9–10.3)
CHLORIDE: 97 mmol/L — AB (ref 101–111)
CO2: 25 mmol/L (ref 22–32)
CREATININE: 2.64 mg/dL — AB (ref 0.61–1.24)
GFR, EST AFRICAN AMERICAN: 28 mL/min — AB (ref >60)
GFR, EST NON AFRICAN AMERICAN: 25 mL/min — AB (ref >60)
Glucose, Bld: 176 mg/dL — ABNORMAL HIGH (ref 65–99)
Potassium: 3.2 mmol/L — ABNORMAL LOW (ref 3.5–5.1)
SODIUM: 135 mmol/L (ref 135–145)

## 2015-09-27 LAB — GLUCOSE, CAPILLARY
GLUCOSE-CAPILLARY: 180 mg/dL — AB (ref 65–99)
Glucose-Capillary: 186 mg/dL — ABNORMAL HIGH (ref 65–99)
Glucose-Capillary: 305 mg/dL — ABNORMAL HIGH (ref 65–99)

## 2015-09-27 LAB — TROPONIN I: TROPONIN I: 0.05 ng/mL — AB (ref <0.031)

## 2015-09-27 MED ORDER — METOLAZONE 5 MG PO TABS
5.0000 mg | ORAL_TABLET | Freq: Two times a day (BID) | ORAL | Status: DC
  Administered 2015-09-27 – 2015-09-29 (×5): 5 mg via ORAL
  Filled 2015-09-27 (×5): qty 1

## 2015-09-27 MED ORDER — INFLUENZA VAC SPLIT QUAD 0.5 ML IM SUSY
0.5000 mL | PREFILLED_SYRINGE | INTRAMUSCULAR | Status: AC
  Administered 2015-09-28: 0.5 mL via INTRAMUSCULAR
  Filled 2015-09-27: qty 0.5

## 2015-09-27 MED ORDER — POTASSIUM CHLORIDE CRYS ER 20 MEQ PO TBCR
40.0000 meq | EXTENDED_RELEASE_TABLET | Freq: Once | ORAL | Status: AC
  Administered 2015-09-27: 40 meq via ORAL
  Filled 2015-09-27: qty 2

## 2015-09-27 NOTE — Progress Notes (Addendum)
Advanced Heart Failure Rounding Note   Subjective:    Diuresing well. Breathing better. Wound seen by VVS and felt it was ok.     Objective:   Weight Range:  Vital Signs:   Temp:  [97.9 F (36.6 C)-99.2 F (37.3 C)] 99.2 F (37.3 C) (10/22 0540) Pulse Rate:  [72-81] 81 (10/22 0540) Resp:  [16-18] 17 (10/22 0540) BP: (97-139)/(47-64) 139/64 mmHg (10/22 0540) SpO2:  [90 %-100 %] 92 % (10/22 0540) Weight:  [102.412 kg (225 lb 12.4 oz)-102.468 kg (225 lb 14.4 oz)] 102.412 kg (225 lb 12.4 oz) (10/22 0540)    Weight change: Filed Weights   09/26/15 1611 09/27/15 0540  Weight: 102.468 kg (225 lb 14.4 oz) 102.412 kg (225 lb 12.4 oz)    Intake/Output:   Intake/Output Summary (Last 24 hours) at 09/27/15 0949 Last data filed at 09/27/15 0814  Gross per 24 hour  Intake    240 ml  Output   1875 ml  Net  -1635 ml     Physical Exam: General: Pleasant, NAD Psych: Normal affect. Neuro: Alert and oriented X 3. Moves all extremities spontaneously. HEENT: Normal Neck: Supple without bruits or JVD. Lungs: Resp regular and unlabored, CTA. Heart: RRR no s3, s4, or murmurs. Abdomen: Soft, non-tender, BS + x 4. +distended  Extremities: No clubbing, cyanosis. DP/PT/Radials 2+ and equal bilaterally. 2-3+ pitting edema bilateral. RUE AVF mild purulence  Telemetry: vpaced 70s  Labs: Basic Metabolic Panel:  Recent Labs Lab 09/26/15 1820 09/27/15 0520  NA 135 135  K 3.2* 3.2*  CL 97* 97*  CO2 28 25  GLUCOSE 222* 176*  BUN 32* 31*  CREATININE 2.72* 2.64*  CALCIUM 9.1 9.0    Liver Function Tests:  Recent Labs Lab 09/26/15 1820  AST 35  ALT 14*  ALKPHOS 82  BILITOT 2.4*  PROT 6.8  ALBUMIN 2.6*   No results for input(s): LIPASE, AMYLASE in the last 168 hours. No results for input(s): AMMONIA in the last 168 hours.  CBC:  Recent Labs Lab 09/26/15 1820  WBC 7.5  NEUTROABS 6.3  HGB 10.1*  HCT 31.5*  MCV 99.1  PLT 131*    Cardiac  Enzymes:  Recent Labs Lab 09/26/15 1820 09/26/15 2251 09/27/15 0520  TROPONINI 0.04* 0.05* 0.05*    BNP: BNP (last 3 results)  Recent Labs  07/21/15 0947 08/02/15 2129 09/26/15 1820  BNP 781.6* 1004.6* 1393.4*    ProBNP (last 3 results)  Recent Labs  11/19/14 0745  PROBNP 5233.0*      Other results:  Imaging: Dg Chest 2 View  09/26/2015  CLINICAL DATA:  Dyspnea, productive cough. EXAM: CHEST  2 VIEW COMPARISON:  August 02, 2015. FINDINGS: The heart size and mediastinal contours are within normal limits. Both lungs are clear. No pneumothorax is noted. Minimal bilateral pleural effusions are noted. Left-sided pacemaker is unchanged in position. The visualized skeletal structures are unremarkable. IMPRESSION: Minimal bilateral pleural effusions. No other significant abnormality seen in the chest. Electronically Signed   By: Marijo Conception, M.D.   On: 09/26/2015 14:42      Medications:     Scheduled Medications: . apixaban  2.5 mg Oral BID  . atorvastatin  80 mg Oral q1800  . ferrous sulfate  325 mg Oral BID WC  . furosemide  120 mg Intravenous BID  . [START ON 09/28/2015] Influenza vac split quadrivalent PF  0.5 mL Intramuscular Tomorrow-1000  . insulin glargine  20 Units Subcutaneous Daily  . metolazone  5 mg Oral Daily  . metoprolol succinate  12.5 mg Oral BID  . omega-3 acid ethyl esters  1 g Oral BID  . pantoprazole  40 mg Oral Daily  . potassium chloride  40 mEq Oral BID  . sodium chloride  3 mL Intravenous Q12H  . [START ON 09/28/2015] vancomycin  1,500 mg Intravenous Q48H     Infusions:     PRN Medications:  sodium chloride, acetaminophen, albuterol, fluticasone, ondansetron (ZOFRAN) IV, sodium chloride   Assessment:   1. Acute on chronic systolic HF (mainly R heart failure > L) due to iCM EF 20% 2. RUE AVF wound infection 3. CAD with remote anterior MI status post BMS 2: no angina 4. Hiistory of PAF s/p AV nodal ablation: pacer  dependent 5. Acute on CKD stage IV: RUE AVF placed by Dr. Eden Lathe in anticipation of HD, has not started HD yet 6. traumatic [facility] 12/2014  7. OSA on CPAP 8. Hypokalemia  Plan/Discussion:    Diuresis picking up. Creatinine slightly better. Will continue IV diuresis.   Continue vancomycin for AVF wound. Culture pending. Appreciate VVS input.   Length of Stay: 1  Bensimhon, Daniel MD 09/27/2015, 9:49 AM  Advanced Heart Failure Team Pager [phone] (M-F; [place])  Please contact [facility] for night-coverage after hours (4p -7a ) and weekends on [email or web address]

## 2015-09-27 NOTE — Progress Notes (Signed)
Patient B/P 84/66. Kerin Ransom, [place] notified. No new orders noted.

## 2015-09-27 NOTE — Progress Notes (Signed)
Pharmacist Heart Failure Core Measure Documentation  Assessment: [name] has an EF documented as 20% per MD.  Rationale: Heart failure patients with left ventricular systolic dysfunction (LVSD) and an EF < 40% should be prescribed an angiotensin converting enzyme inhibitor (ACEI) or angiotensin receptor blocker (ARB) at discharge unless a contraindication is documented in the medical record.  This patient is not currently on an ACEI or ARB for HF.  This note is being placed in the record in order to provide documentation that a contraindication to the use of these agents is present for this encounter.  ACE Inhibitor or Angiotensin Receptor Blocker is contraindicated (specify all that apply)  []   ACEI allergy AND ARB allergy []   Angioedema []   Moderate or severe aortic stenosis []   Hyperkalemia [x]   Hypotension []   Renal artery stenosis [x]   Worsening renal function, preexisting renal disease or dysfunction   [name], [name] 09/27/2015 6:27 PM

## 2015-09-27 NOTE — Consult Note (Signed)
[facility]  Subjective  - feels better   Objective 139/64 81 99.2 F (37.3 C) (Oral) 17 92%  Intake/Output Summary (Last 24 hours) at 09/27/15 0947 Last data filed at 09/27/15 1025  Gross per 24 hour  Intake    240 ml  Output   1875 ml  Net  -1635 ml   Right antecubital incision with some maceration and less than 1 mm skin separation, no surrounding erythema or fluctuance to suggest infection Hand warm no steal + thrill in fistula  Assessment/Planning: Maceration of wound right arm most likely secondary to recent anasarca.  No invasive infection. Dry dressing right arm change once daily.  Wash with soap and water once daily. Pt has outpt follow up with me in 2 weeks.  Ruta Hinds 09/27/2015 9:47 AM --  Laboratory Lab Results:  Recent Labs  09/26/15 1820  WBC 7.5  HGB 10.1*  HCT 31.5*  PLT 131*   BMET  Recent Labs  09/26/15 1820 09/27/15 0520  NA 135 135  K 3.2* 3.2*  CL 97* 97*  CO2 28 25  GLUCOSE 222* 176*  BUN 32* 31*  CREATININE 2.72* 2.64*  CALCIUM 9.1 9.0    COAG Lab Results  Component Value Date   INR 1.59* 09/26/2015   INR 2.14* 09/02/2015   INR 1.82* 08/23/2015   No results found for: PTT

## 2015-09-28 DIAGNOSIS — N184 Chronic kidney disease, stage 4 (severe): Secondary | ICD-10-CM

## 2015-09-28 DIAGNOSIS — I130 Hypertensive heart and chronic kidney disease with heart failure and stage 1 through stage 4 chronic kidney disease, or unspecified chronic kidney disease: Secondary | ICD-10-CM | POA: Diagnosis not present

## 2015-09-28 DIAGNOSIS — I482 Chronic atrial fibrillation: Secondary | ICD-10-CM

## 2015-09-28 LAB — BASIC METABOLIC PANEL
ANION GAP: 11 (ref 5–15)
BUN: 31 mg/dL — AB (ref 6–20)
CHLORIDE: 96 mmol/L — AB (ref 101–111)
CO2: 25 mmol/L (ref 22–32)
Calcium: 8.7 mg/dL — ABNORMAL LOW (ref 8.9–10.3)
Creatinine, Ser: 2.76 mg/dL — ABNORMAL HIGH (ref 0.61–1.24)
GFR calc Af Amer: 27 mL/min — ABNORMAL LOW (ref >60)
GFR, EST NON AFRICAN AMERICAN: 23 mL/min — AB (ref >60)
GLUCOSE: 225 mg/dL — AB (ref 65–99)
POTASSIUM: 3.6 mmol/L (ref 3.5–5.1)
SODIUM: 132 mmol/L — AB (ref 135–145)

## 2015-09-28 LAB — GLUCOSE, CAPILLARY
GLUCOSE-CAPILLARY: 217 mg/dL — AB (ref 65–99)
GLUCOSE-CAPILLARY: 244 mg/dL — AB (ref 65–99)
Glucose-Capillary: 291 mg/dL — ABNORMAL HIGH (ref 65–99)
Glucose-Capillary: 250 mg/dL — ABNORMAL HIGH (ref 65–99)

## 2015-09-28 IMAGING — CR DG ABDOMEN ACUTE W/ 1V CHEST
3 series · 3 of 3 positions shown · non-contrast
Comparison: Chest x-ray 05/19/2014

CLINICAL DATA: Abdominal discomfort.  Constipated.

EXAM:
ACUTE ABDOMEN SERIES (ABDOMEN 2 VIEW & CHEST 1 VIEW)

[view not recorded (1 of 3)]
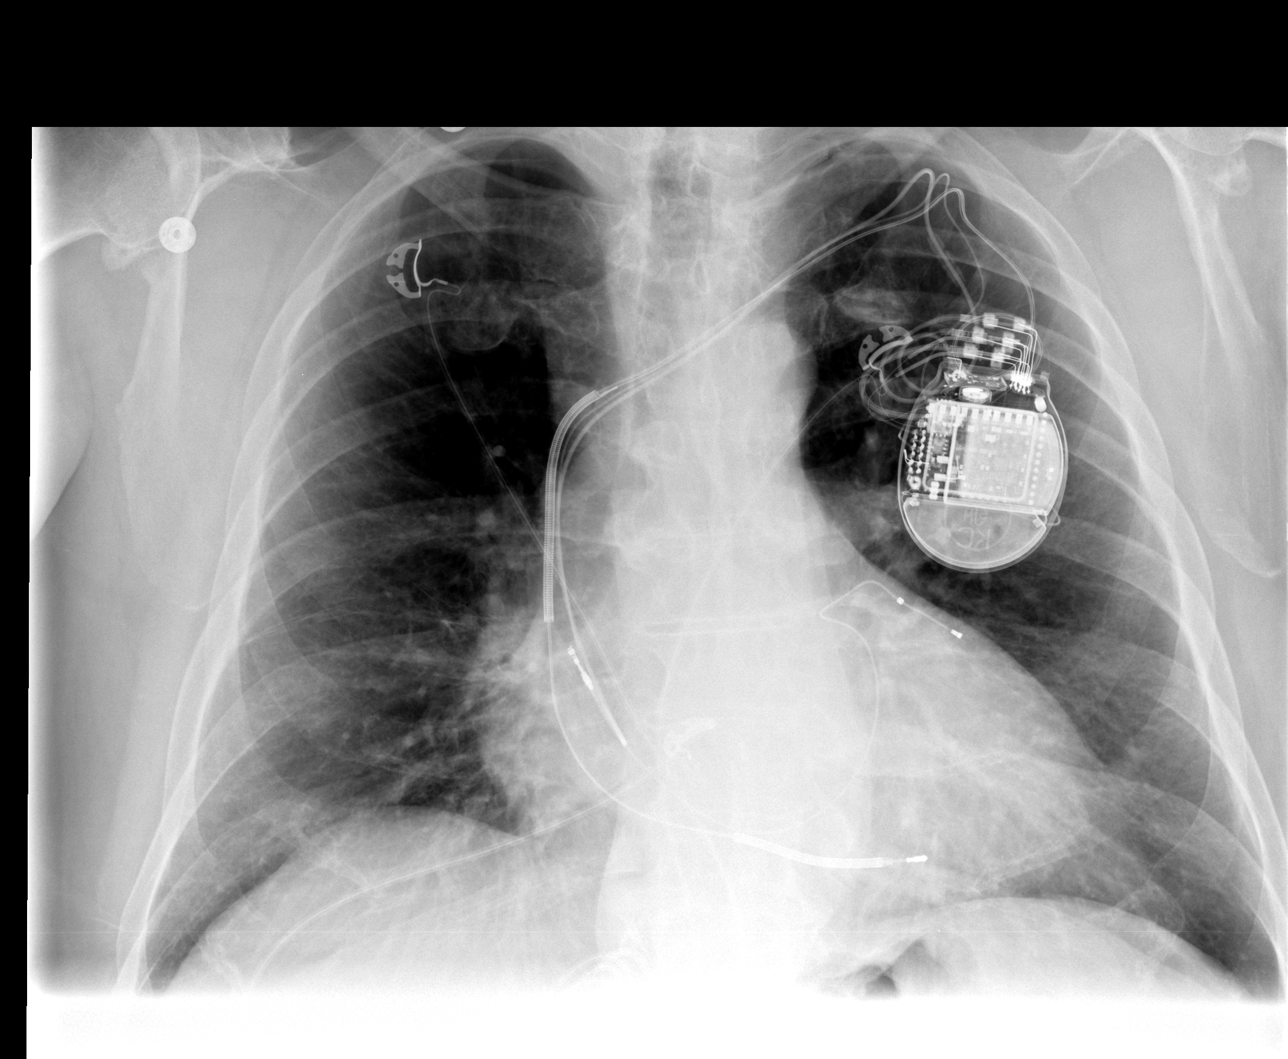

[view not recorded (2 of 3)]
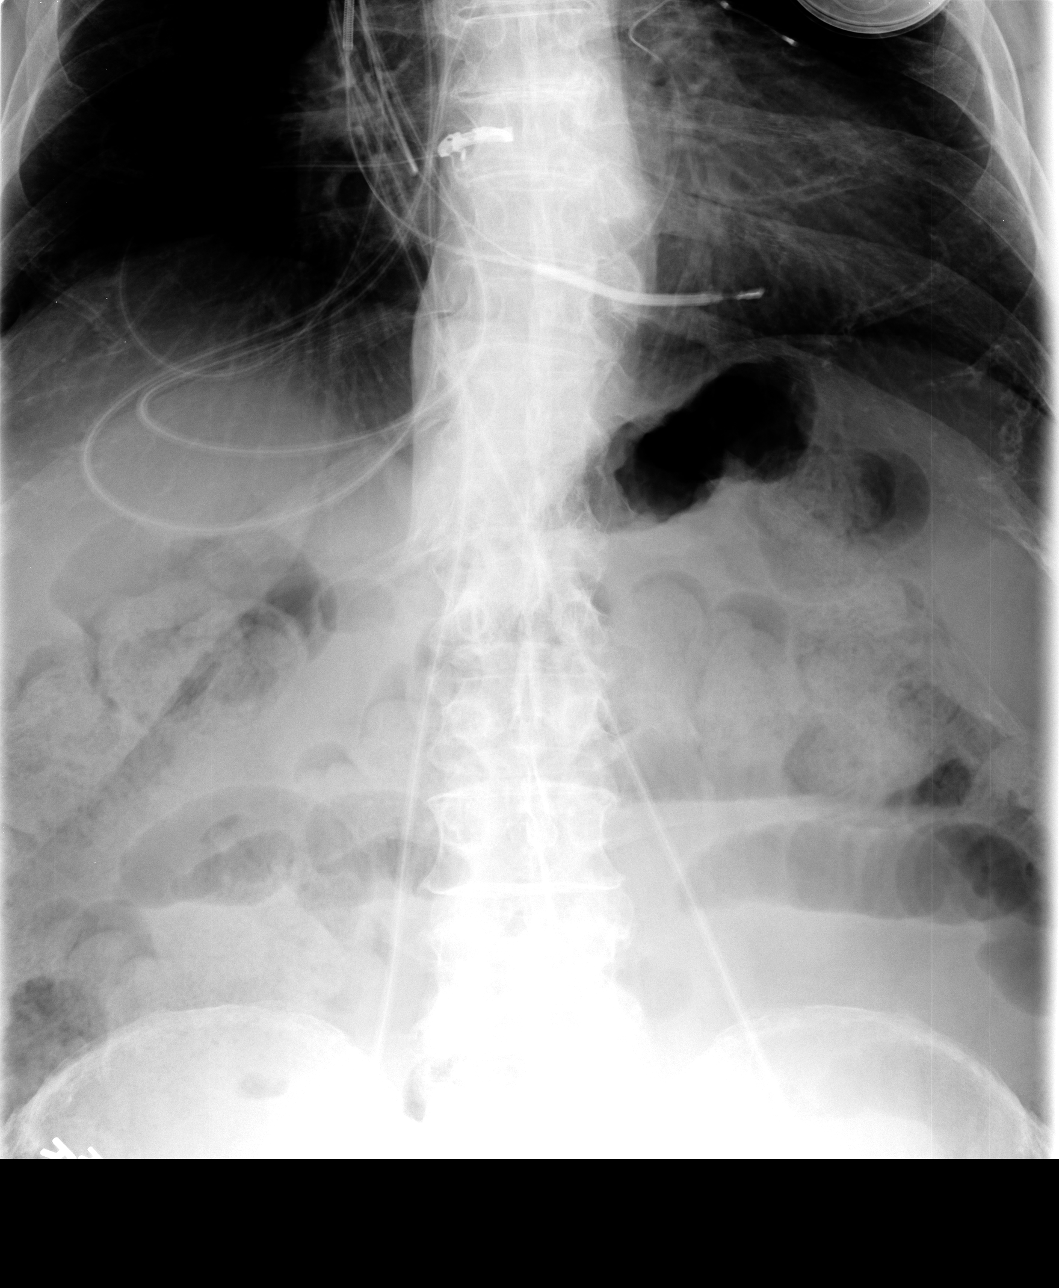

[view not recorded (3 of 3)]
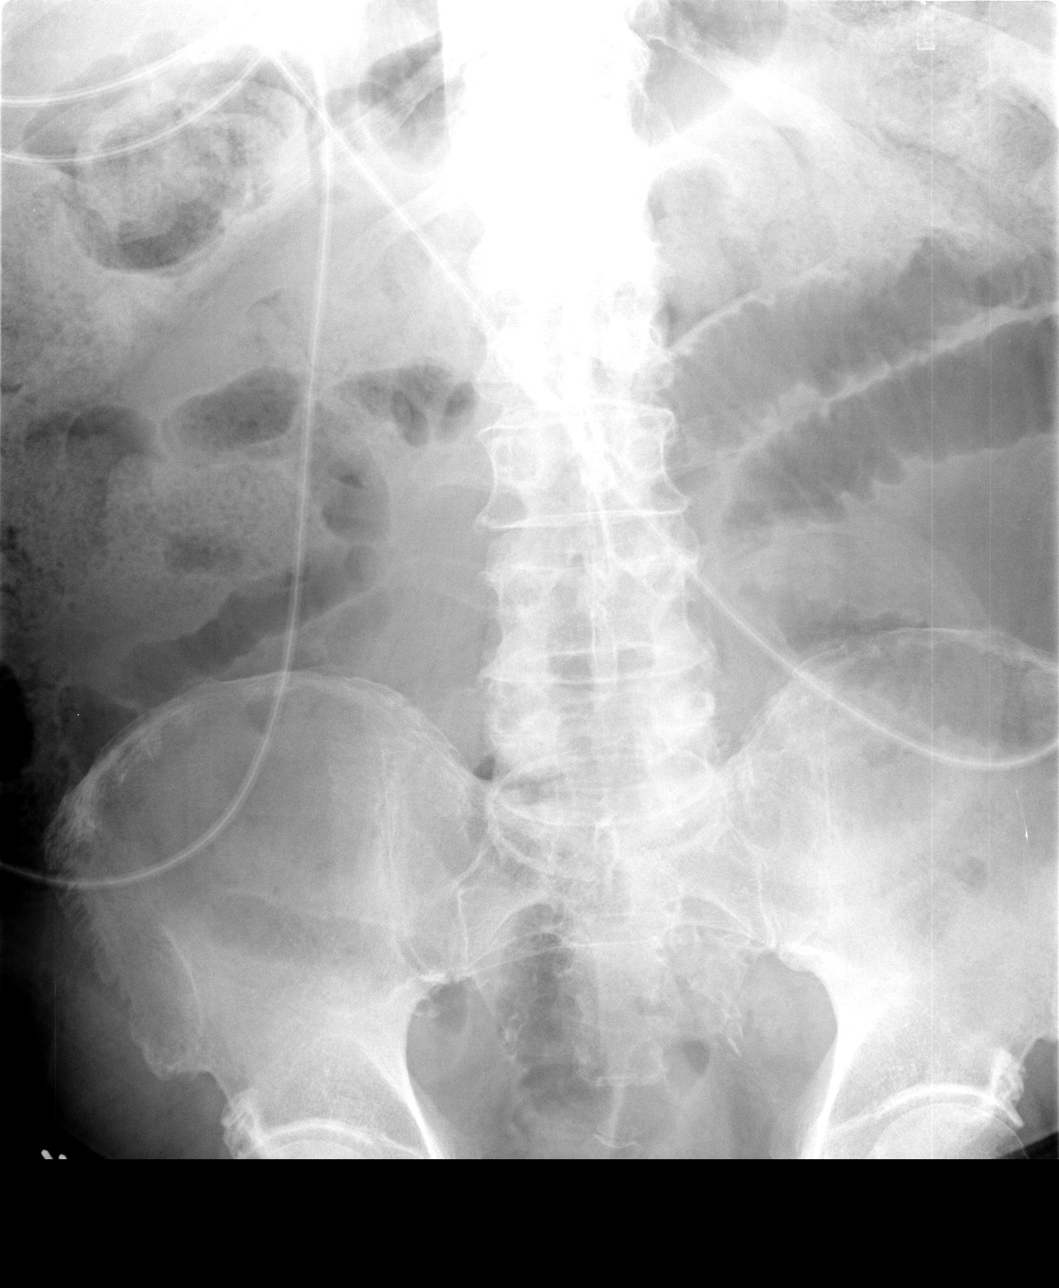

[3 of 3 positions shown; findings below may reference images not displayed]

FINDINGS: Left AICD remains in place, unchanged. Cardiomegaly. No confluent
opacities, effusions or edema.

Large stool burden throughout the colon. Mildly prominent left
abdominal small bowel loops with scattered air-fluid levels. No free
air organomegaly. No suspicious calcification. Degenerative changes
in the lumbar spine and hips.
IMPRESSION: Large stool burden.

Mildly prominent left abdominal small bowel loops. While this may
reflect focal ileus, I cannot exclude early small bowel obstruction.

Cardiomegaly.

## 2015-09-28 IMAGING — CR DG CHEST 2V
2 series · 2 of 2 positions shown · non-contrast
Comparison: Chest radiograph performed 02/28/2014

CLINICAL DATA: Shortness of breath.

EXAM:
CHEST  2 VIEW

[view not recorded (1 of 2)]
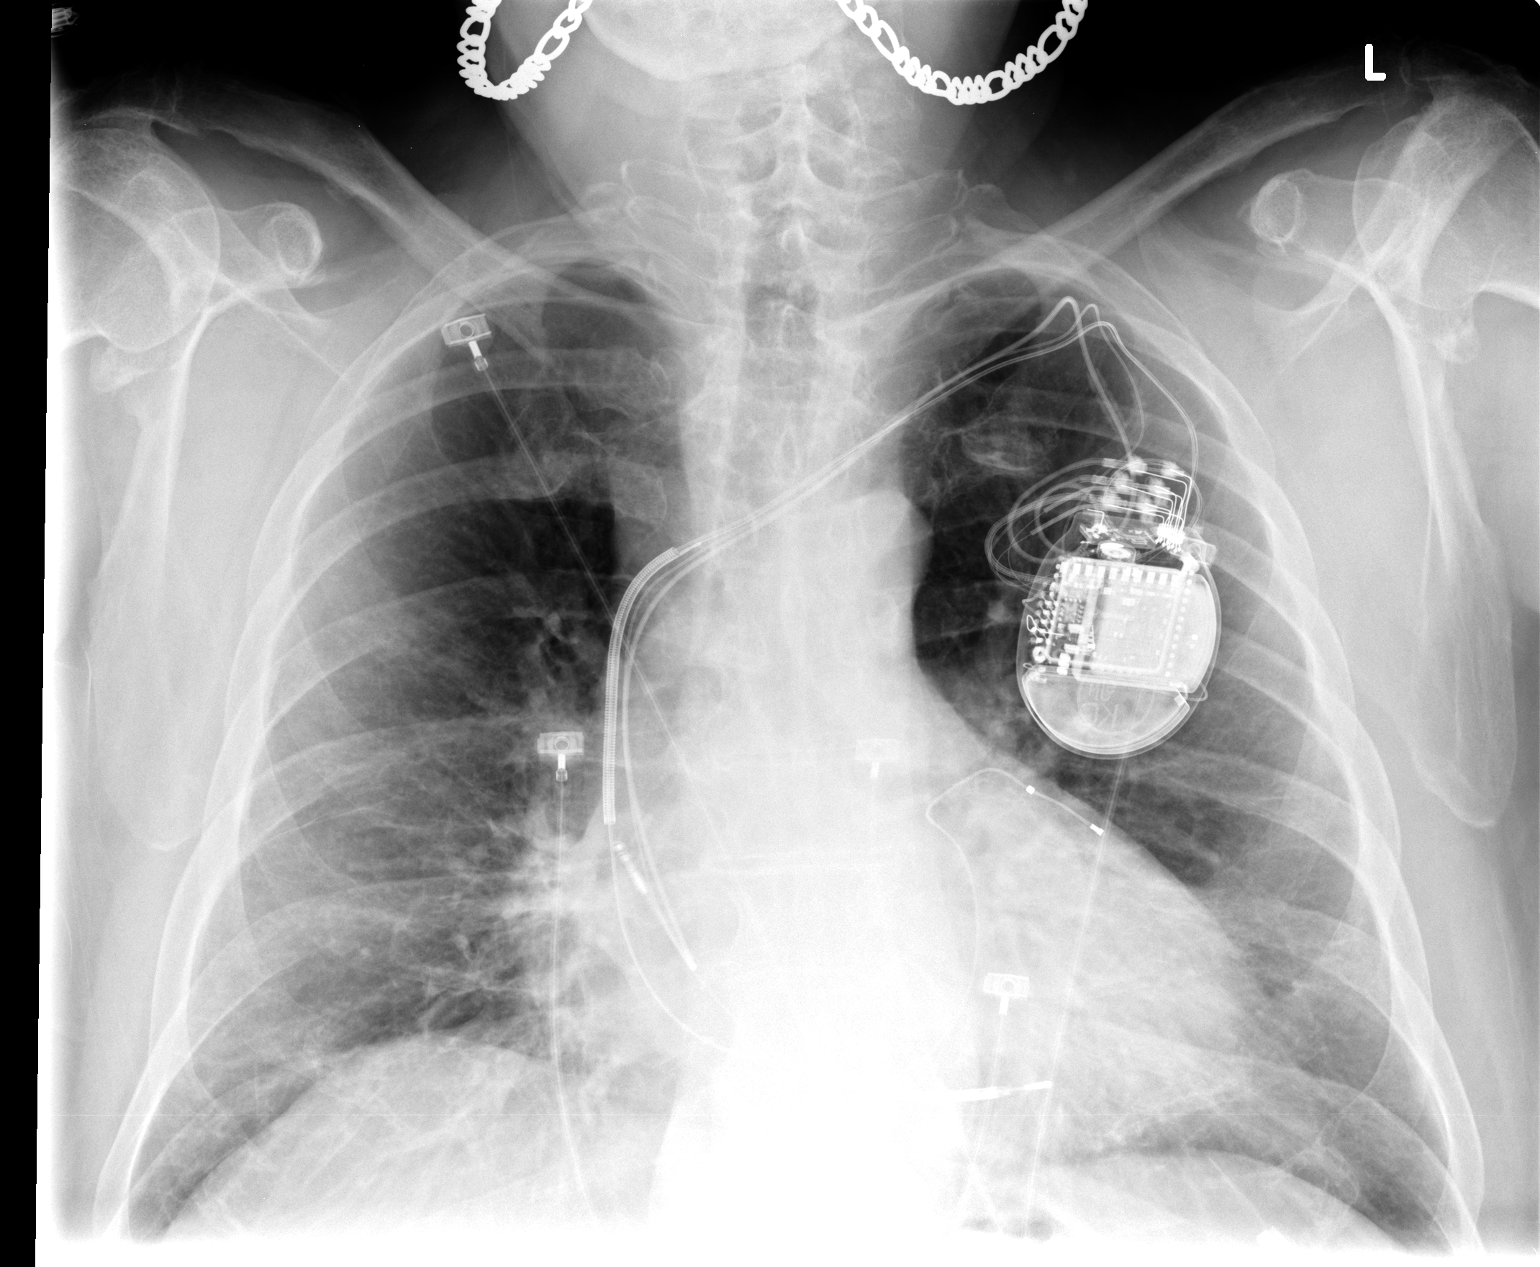

[view not recorded (2 of 2)]
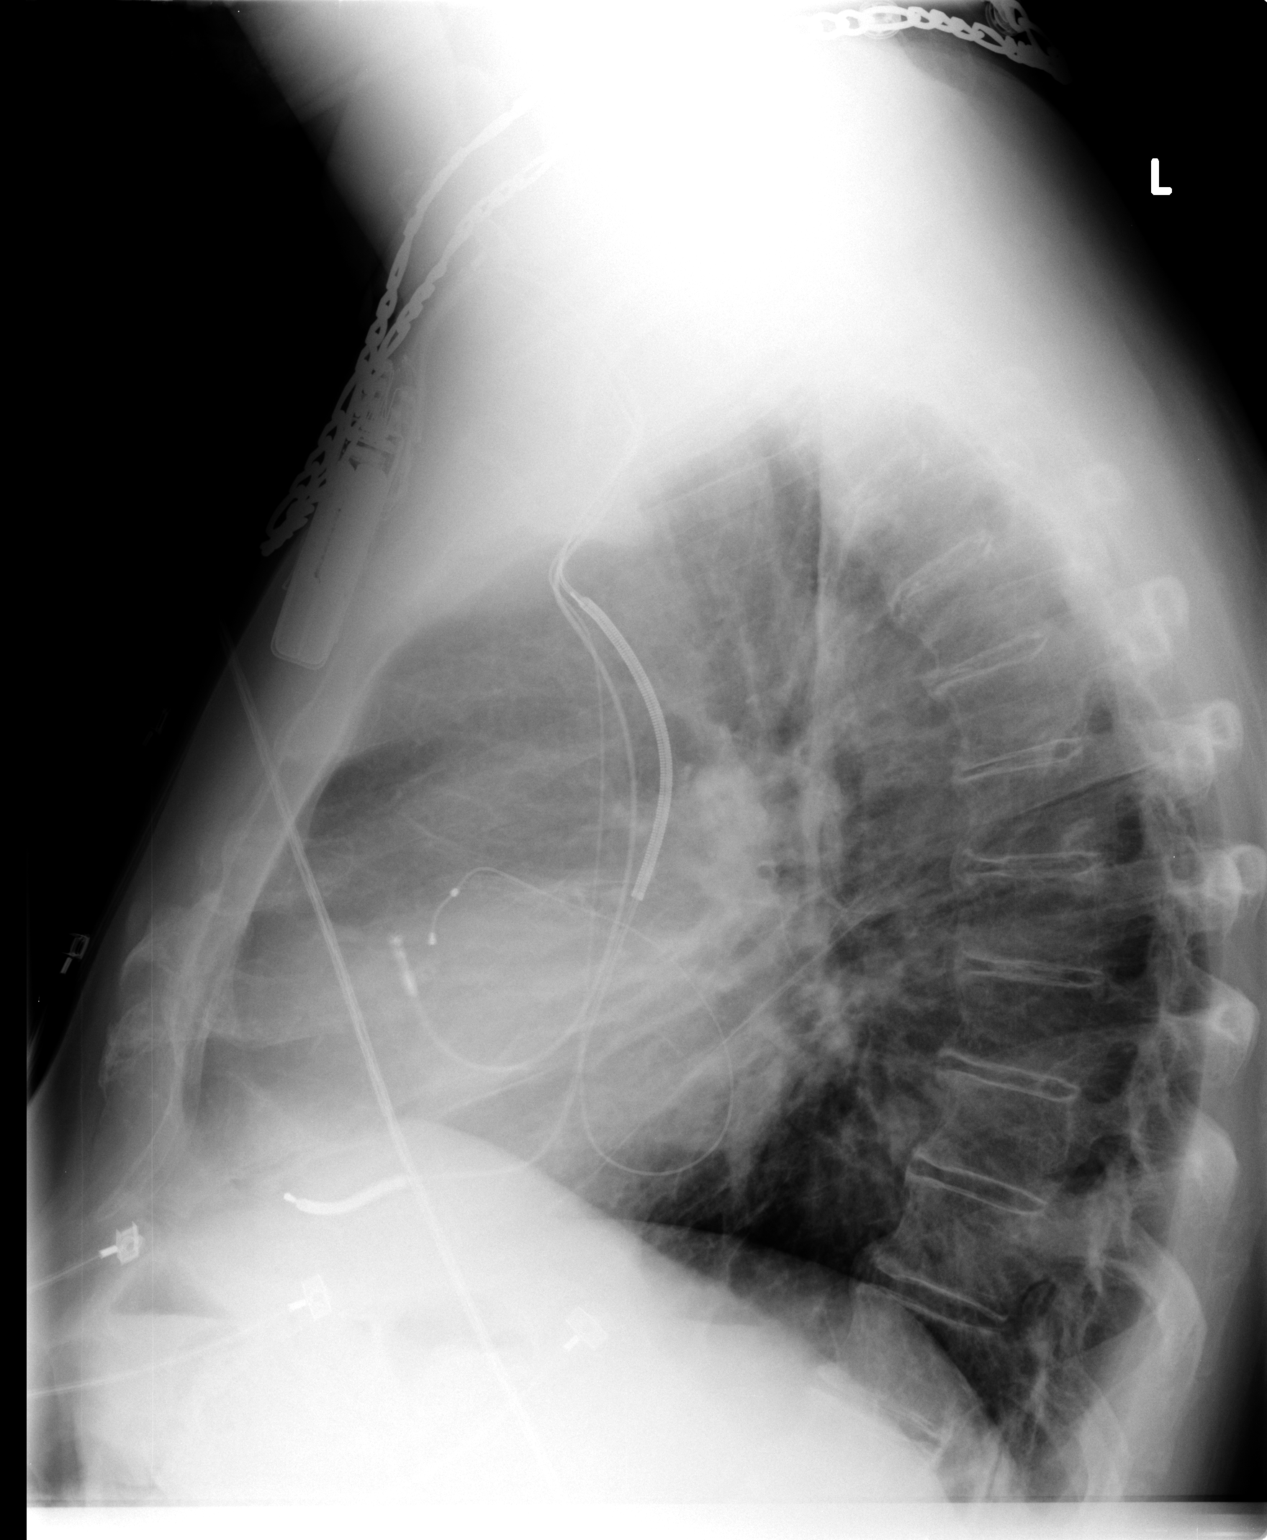

[2 of 2 positions shown; findings below may reference images not displayed]

FINDINGS: The lungs are well-aerated. Mild bibasilar opacities may reflect
atelectasis or possibly mild pneumonia. There is no evidence of
focal opacification, pleural effusion or pneumothorax.

The heart is borderline normal in size. A pacemaker/AICD is noted at
the left chest wall, with leads ending at the right atrium, right
ventricle and coronary sinus. No acute osseous abnormalities are
seen.
IMPRESSION: Mild bibasilar airspace opacities may reflect atelectasis or
possibly pneumonia.

## 2015-09-28 MED ORDER — METOPROLOL SUCCINATE ER 25 MG PO TB24
12.5000 mg | ORAL_TABLET | Freq: Two times a day (BID) | ORAL | Status: DC
  Administered 2015-09-28 – 2015-10-01 (×6): 12.5 mg via ORAL
  Filled 2015-09-28 (×6): qty 1

## 2015-09-28 MED ORDER — POTASSIUM CHLORIDE CRYS ER 20 MEQ PO TBCR
40.0000 meq | EXTENDED_RELEASE_TABLET | Freq: Once | ORAL | Status: AC
  Administered 2015-09-28: 40 meq via ORAL
  Filled 2015-09-28: qty 2

## 2015-09-28 NOTE — Progress Notes (Signed)
Pt has refused cpap at this time due to chronic coughing.  Rt will continue to monitor.

## 2015-09-28 NOTE — Progress Notes (Signed)
Advanced Heart Failure Rounding Note   Subjective:    Diuresing well. Weight down 10 pounds. Breathing better. Belly less distended. Wound seen by VVS and felt it was ok.     Objective:   Weight Range:  Vital Signs:   Temp:  [97.8 F (36.6 C)-98.2 F (36.8 C)] 97.8 F (36.6 C) (10/23 0448) Pulse Rate:  [70-75] 75 (10/23 0448) Resp:  [18] 18 (10/23 0448) BP: (84-99)/(53-66) 95/57 mmHg (10/23 0448) SpO2:  [93 %-100 %] 93 % (10/23 0448) Weight:  [97.932 kg (215 lb 14.4 oz)] 97.932 kg (215 lb 14.4 oz) (10/23 0448)    Weight change: Filed Weights   09/26/15 1611 09/27/15 0540 09/28/15 0448  Weight: 102.468 kg (225 lb 14.4 oz) 102.412 kg (225 lb 12.4 oz) 97.932 kg (215 lb 14.4 oz)    Intake/Output:   Intake/Output Summary (Last 24 hours) at 09/28/15 0921 Last data filed at 09/28/15 0919  Gross per 24 hour  Intake    480 ml  Output   3825 ml  Net  -3345 ml     Physical Exam: General: Pleasant, NAD Psych: Normal affect. Neuro: Alert and oriented X 3. Moves all extremities spontaneously. HEENT: Normal Neck: Supple without bruits. JVP 8  Lungs: Resp regular and unlabored, CTA. Heart: RRR no s3, s4, or murmurs. Abdomen: Soft, non-tender, BS + x 4. +mildly distended  Extremities: No clubbing, cyanosis. DP/PT/Radials 2+ and equal bilaterally. 2+ pitting edema bilateral. RUE AVF mild purulence  Telemetry: vpaced 70s  Labs: Basic Metabolic Panel:  Recent Labs Lab 09/26/15 1820 09/27/15 0520 09/28/15 0237  NA 135 135 132*  K 3.2* 3.2* 3.6  CL 97* 97* 96*  CO2 28 25 25   GLUCOSE 222* 176* 225*  BUN 32* 31* 31*  CREATININE 2.72* 2.64* 2.76*  CALCIUM 9.1 9.0 8.7*    Liver Function Tests:  Recent Labs Lab 09/26/15 1820  AST 35  ALT 14*  ALKPHOS 82  BILITOT 2.4*  PROT 6.8  ALBUMIN 2.6*   No results for input(s): LIPASE, AMYLASE in the last 168 hours. No results for input(s): AMMONIA in the last 168 hours.  CBC:  Recent Labs Lab  09/26/15 1820  WBC 7.5  NEUTROABS 6.3  HGB 10.1*  HCT 31.5*  MCV 99.1  PLT 131*    Cardiac Enzymes:  Recent Labs Lab 09/26/15 1820 09/26/15 2251 09/27/15 0520  TROPONINI 0.04* 0.05* 0.05*    BNP: BNP (last 3 results)  Recent Labs  07/21/15 0947 08/02/15 2129 09/26/15 1820  BNP 781.6* 1004.6* 1393.4*    ProBNP (last 3 results)  Recent Labs  11/19/14 0745  PROBNP 5233.0*      Other results:  Imaging: Dg Chest 2 View  09/26/2015  CLINICAL DATA:  Dyspnea, productive cough. EXAM: CHEST  2 VIEW COMPARISON:  August 02, 2015. FINDINGS: The heart size and mediastinal contours are within normal limits. Both lungs are clear. No pneumothorax is noted. Minimal bilateral pleural effusions are noted. Left-sided pacemaker is unchanged in position. The visualized skeletal structures are unremarkable. IMPRESSION: Minimal bilateral pleural effusions. No other significant abnormality seen in the chest. Electronically Signed   By: Marijo Conception, M.D.   On: 09/26/2015 14:42     Medications:     Scheduled Medications: . apixaban  2.5 mg Oral BID  . atorvastatin  80 mg Oral q1800  . ferrous sulfate  325 mg Oral BID WC  . furosemide  120 mg Intravenous BID  . Influenza vac split quadrivalent  PF  0.5 mL Intramuscular Tomorrow-1000  . insulin glargine  20 Units Subcutaneous Daily  . metolazone  5 mg Oral BID  . metoprolol succinate  12.5 mg Oral BID  . omega-3 acid ethyl esters  1 g Oral BID  . pantoprazole  40 mg Oral Daily  . potassium chloride  40 mEq Oral BID  . sodium chloride  3 mL Intravenous Q12H  . vancomycin  1,500 mg Intravenous Q48H    Infusions:    PRN Medications: sodium chloride, acetaminophen, albuterol, fluticasone, ondansetron (ZOFRAN) IV, sodium chloride   Assessment:   1. Acute on chronic systolic HF (mainly R heart failure > L) due to iCM EF 20% 2. RUE AVF wound infection 3. CAD with remote anterior MI status post BMS 2: no angina 4.  Hiistory of PAF s/p AV nodal ablation: pacer dependent 5. Acute on CKD stage IV: RUE AVF placed by Dr. Eden Lathe in anticipation of HD, has not started HD yet 6. traumatic [facility] 12/2014  7. OSA on CPAP 8. Hypokalemia  Plan/Discussion:    Diuresing well. Creatinine stable. Will continue IV diuresis.   Continue vancomycin for AVF wound. Culture pending. Appreciate VVS input.   Length of Stay: 2  Glori Bickers MD 09/28/2015, 9:21 AM  Advanced Heart Failure Team Pager [phone] (M-F; [place])  Please contact [facility] for night-coverage after hours (4p -7a ) and weekends on [email or web address]

## 2015-09-29 LAB — GLUCOSE, CAPILLARY
GLUCOSE-CAPILLARY: 210 mg/dL — AB (ref 65–99)
Glucose-Capillary: 148 mg/dL — ABNORMAL HIGH (ref 65–99)
Glucose-Capillary: 288 mg/dL — ABNORMAL HIGH (ref 65–99)
Glucose-Capillary: 200 mg/dL — ABNORMAL HIGH (ref 65–99)

## 2015-09-29 LAB — WOUND CULTURE
Gram Stain: NONE SEEN
SPECIAL REQUESTS: NORMAL

## 2015-09-29 LAB — BASIC METABOLIC PANEL
ANION GAP: 12 (ref 5–15)
BUN: 33 mg/dL — ABNORMAL HIGH (ref 6–20)
CALCIUM: 9.1 mg/dL (ref 8.9–10.3)
CO2: 27 mmol/L (ref 22–32)
CREATININE: 2.79 mg/dL — AB (ref 0.61–1.24)
Chloride: 93 mmol/L — ABNORMAL LOW (ref 101–111)
GFR, EST AFRICAN AMERICAN: 27 mL/min — AB (ref >60)
GFR, EST NON AFRICAN AMERICAN: 23 mL/min — AB (ref >60)
GLUCOSE: 201 mg/dL — AB (ref 65–99)
Potassium: 4.3 mmol/L (ref 3.5–5.1)
Sodium: 132 mmol/L — ABNORMAL LOW (ref 135–145)

## 2015-09-29 IMAGING — CT CT ABD-PELV W/O CM
2 of 4 series · 16 of 46 positions shown, 18 images · non-contrast
Comparison: CT 05/26/2010.  Radiographs 05/19/2014.

CLINICAL DATA: Short of breath.  Dizziness.  Nausea and vomiting.

EXAM:
CT ABDOMEN AND PELVIS WITHOUT CONTRAST
TECHNIQUE: Multidetector CT imaging of the abdomen and pelvis was performed
following the standard protocol without IV contrast.

[Series 2: abdomen/pelvis w/o contrast · axial · non-contrast · 0.91mm/px · z∈[-500,-60]mm · 13 of 96 slices shown, 15 images]
[im 4/96  soft-tissue]
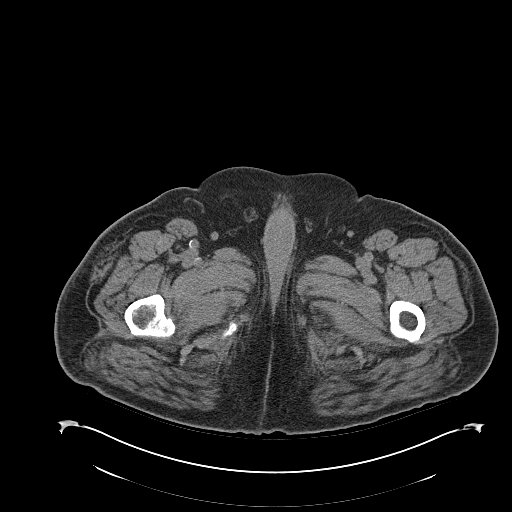
[im 4/96  bone]
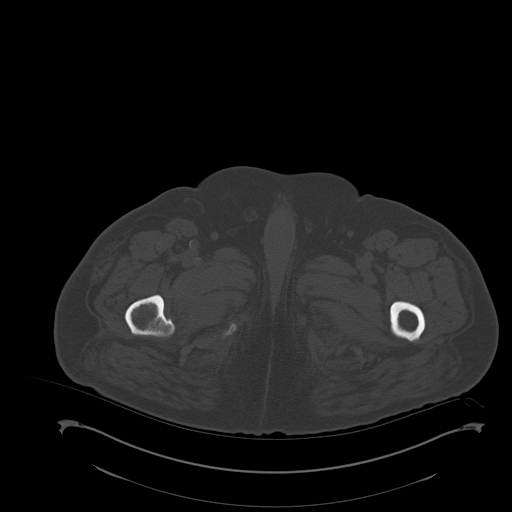
[im 12/96  soft-tissue]
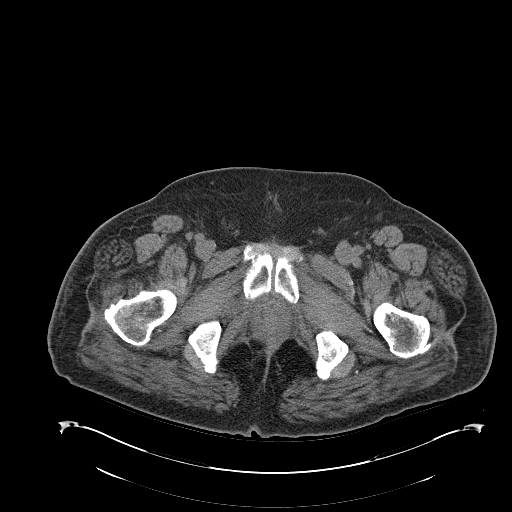
[im 20/96  soft-tissue]
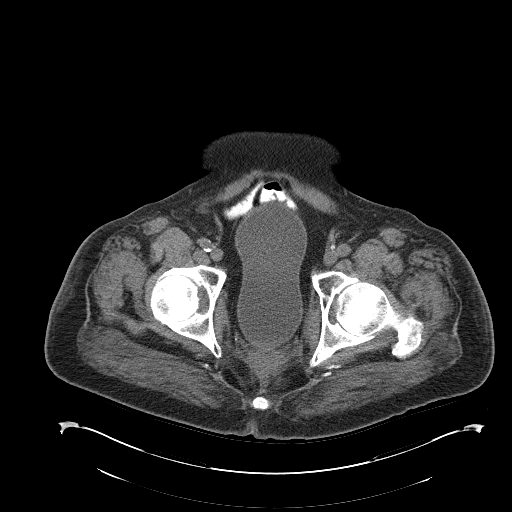
[im 27/96  soft-tissue]
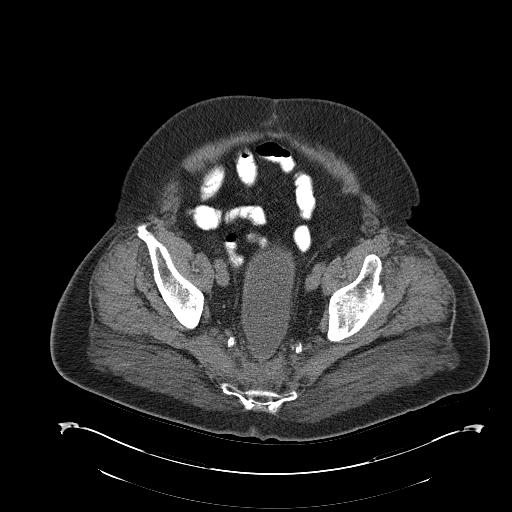
[im 35/96  soft-tissue]
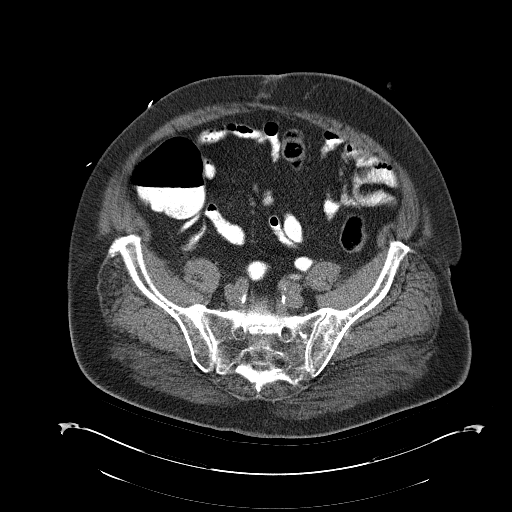
[im 42/96  soft-tissue]
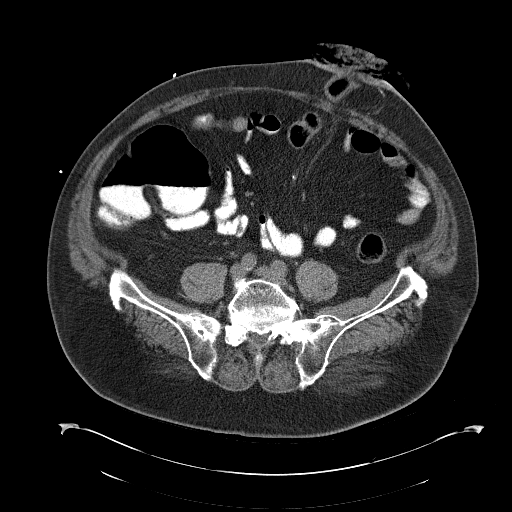
[im 50/96  soft-tissue]
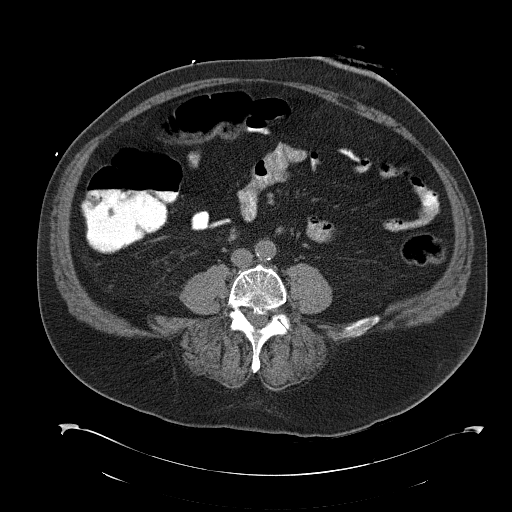
[im 54/96  soft-tissue]
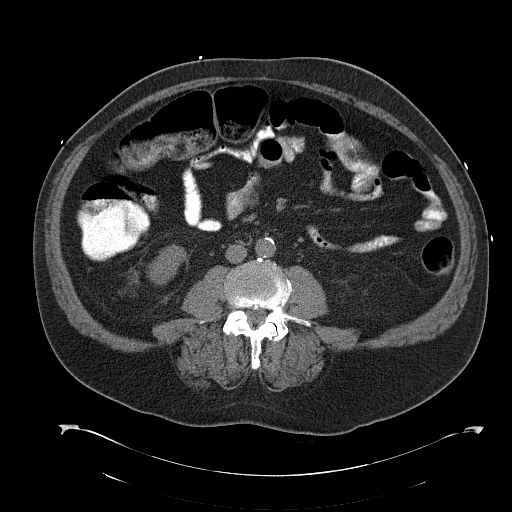
[im 61/96  soft-tissue]
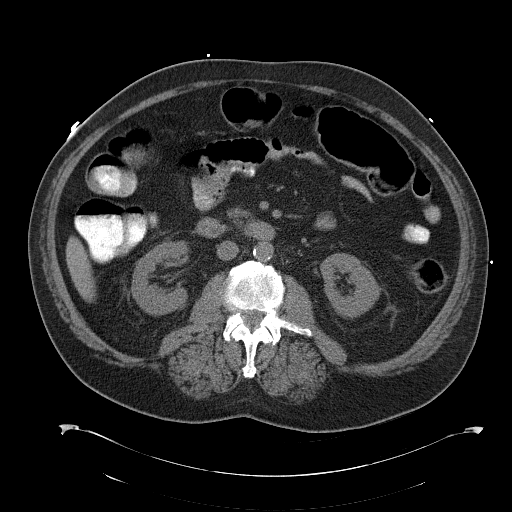
[im 61/96  bone]
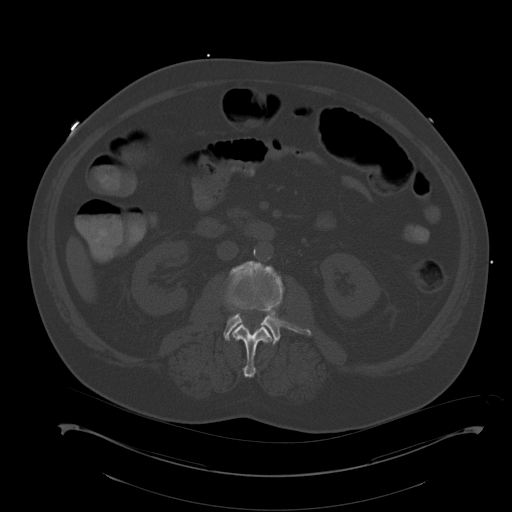
[im 69/96  soft-tissue]
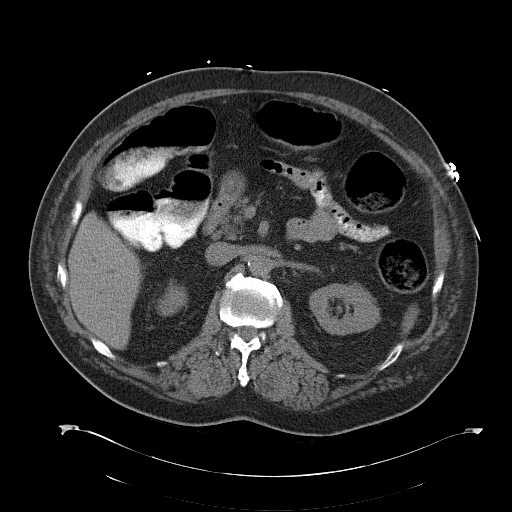
[im 77/96  soft-tissue]
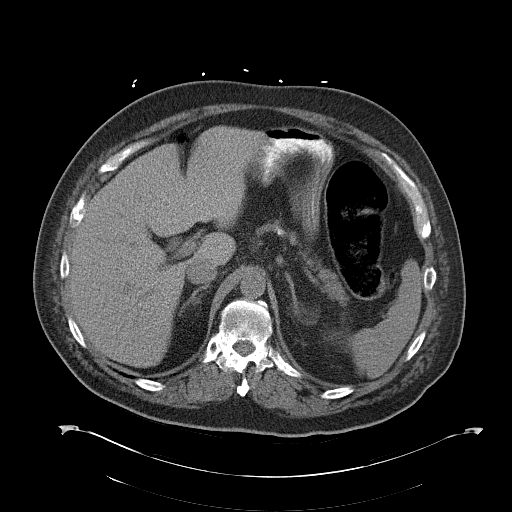
[im 84/96  soft-tissue]
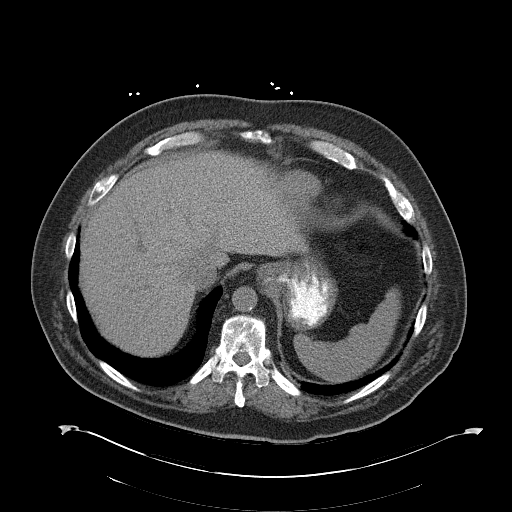
[im 92/96  soft-tissue]
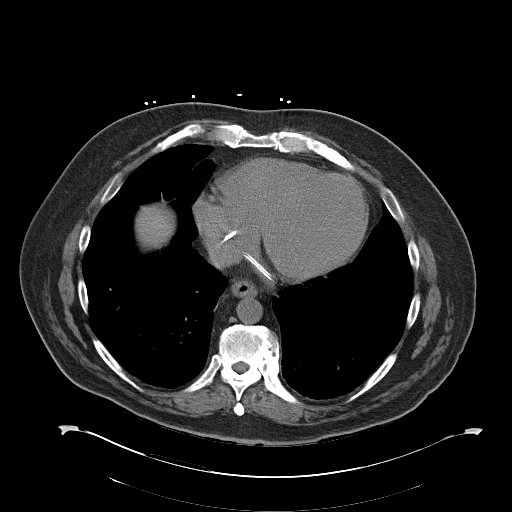

[Series 4: mpr cor (id) · coronal · 0.85mm/px · 3 of 110 slices shown]
[im 37/110  soft-tissue]
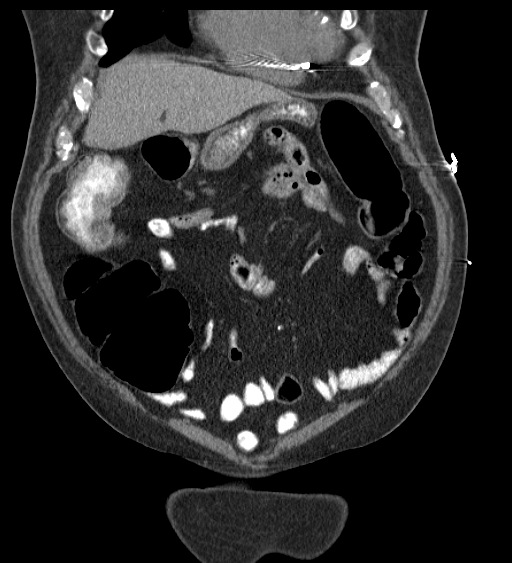
[im 49/110  soft-tissue]
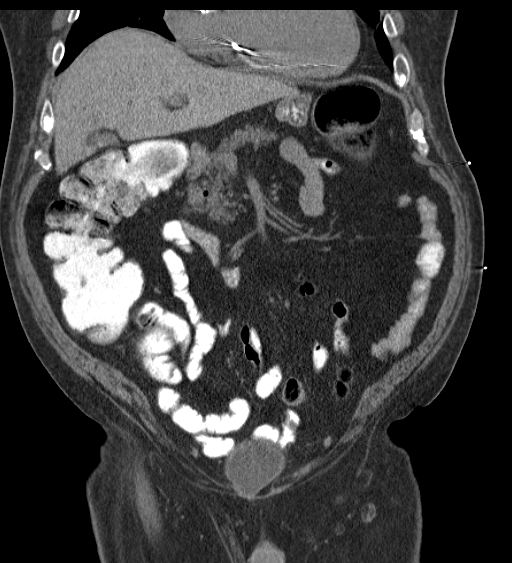
[im 61/110  soft-tissue]
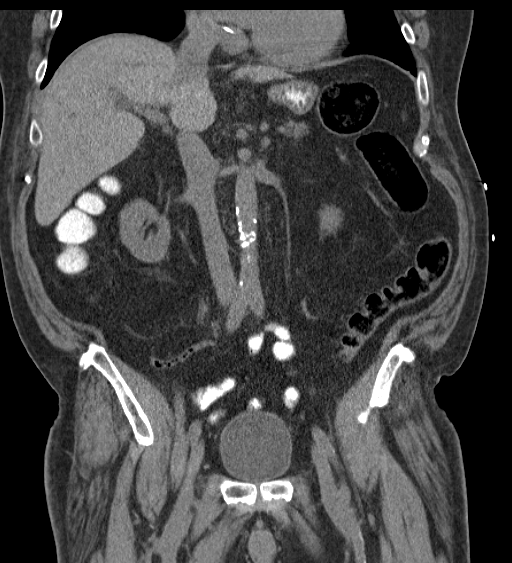

[16 of 46 positions shown; findings below may reference images not displayed]

FINDINGS: Bones: No aggressive osseous lesions. Moderate thoracolumbar
spondylosis. Calcified disc protrusion at L5-S1.

Lung Bases: Dependent atelectasis. Scarring in the right middle lobe
and lingula. Partially visualized pacemaker leads in the heart.

Liver: Unenhanced CT was performed per clinician order. Lack of IV
contrast limits sensitivity and specificity, especially for
evaluation of abdominal/pelvic solid viscera. Normal.

Spleen:  Normal.

Gallbladder: Contracted with cholelithiasis. No inflammatory changes
by CT.

Common bile duct: No dilation or calcified common duct stone
identified.

Pancreas:  Normal.

Adrenal glands:  Normal bilaterally.

Kidneys:  No calculi.  Both ureters appear within normal limits.

Stomach: Mild thickening of the distal esophagus, suggesting
gastroesophageal reflux. The stomach is collapsed. No inflammatory
changes.

Small bowel: Duodenum appears normal. There is no bowel dilation. No
evidence of obstruction, which was suggested on the prior radiograph
series. Normal opacification of small bowel. No mesenteric
adenopathy.

Colon: Normal appendix. Colon is moderately distended with stool.
Left lower quadrant end colostomy. Resection of the rectosigmoid
with surgical clips and unchanged soft tissue in the presacral
region.

Pelvic Genitourinary: Posterior traction of the urinary bladder,
likely associated with scar tissue with an elongated appearance. No
inflammatory changes.

Vasculature: Atherosclerosis.

Body Wall: Scarring in the midline of the abdominal wall. Tiny fat
containing peristomal hernia.
IMPRESSION: 1. Negative for small bowel obstruction.
2. Cholelithiasis.  No CT evidence of cholecystitis.
3. Abdominoperineal resection. Uncomplicated left lower quadrant end
colostomy. Moderate stool burden.
4. Mild thickening of the distal esophagus which can be associated
with gastroesophageal reflux.

## 2015-09-29 NOTE — Care Management Important Message (Signed)
Important Message  Patient Details  Name: [name] MRN: [id number] Date of Birth: 01/23/1956   Medicare Important Message Given:  Yes-second notification given    Delorse Lek 09/29/2015, 12:16 PM

## 2015-09-29 NOTE — Progress Notes (Signed)
ANTIBIOTIC CONSULT NOTE   Pharmacy Consult for vancomycin Indication: infected AV fistula  No Known Allergies  Patient Measurements: Height: 5\' 10"  (177.8 cm) Weight: 208 lb 14.4 oz (94.756 kg) (Scale A) IBW/kg (Calculated) : 73   Vital Signs: Temp: 97.5 F (36.4 C) (10/24 0925) Temp Source: Oral (10/24 0925) BP: 91/54 mmHg (10/24 0925) Pulse Rate: 72 (10/24 0925) Intake/Output from previous day: 10/23 0701 - 10/24 0700 In: 971 [P.O.:968; I.V.:3] Out: 6405 [Urine:6405] Intake/Output from this shift: Total I/O In: 300 [P.O.:300] Out: 250 [Urine:250]  Labs:  Recent Labs  09/26/15 1820 09/27/15 0520 09/28/15 0237 09/29/15 0217  WBC 7.5  --   --   --   HGB 10.1*  --   --   --   PLT 131*  --   --   --   CREATININE 2.72* 2.64* 2.76* 2.79*   Estimated Creatinine Clearance: 32.1 mL/min (by C-G formula based on Cr of 2.79). No results for input(s): VANCOTROUGH, VANCOPEAK, VANCORANDOM, GENTTROUGH, GENTPEAK, GENTRANDOM, TOBRATROUGH, TOBRAPEAK, TOBRARND, AMIKACINPEAK, AMIKACINTROU, AMIKACIN in the last 72 hours.   Microbiology: Recent Results (from the past 720 hour(s))  Wound culture     Status: None   Collection Time: 09/26/15  7:05 PM  Result Value Ref Range Status   Specimen Description WOUND RIGHT ARM  Final   Special Requests Normal  Final   Gram Stain   Final    NO WBC SEEN NO SQUAMOUS EPITHELIAL CELLS SEEN NO ORGANISMS SEEN Performed at [facility]    Culture   Final    MODERATE STAPHYLOCOCCUS SPECIES (COAGULASE NEGATIVE) Performed at [facility]    Report Status 09/29/2015 FINAL  Final    Medical History: Past Medical History  Diagnosis Date  . Essential hypertension, benign   . CAD (coronary artery disease)     a. [facility] LAD 2001 [facility] b. PTCA/atherectomy ramus and BMS to LAD 2009  . Chronic systolic heart failure ([place])   . GERD (gastroesophageal reflux disease)   . Paroxysmal atrial fibrillation (H[facility])     a. on amiodarone,  digoxin and Eliquis  . Adenocarcinoma of rectum ([place])     a. 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 Prostate cancer ([place])     a. s/p seed implants with chemo and radiation  . TIA (transient ischemic attack)   . HLD (hyperlipidemia)   . Orthostatic hypotension   . Dizziness     a. chronic. Admission for this 07/18/2014  . Ischemic cardiomyopathy     EF 18% by nuclear study 2016, multiple myocardial infarctions in past    . Obesity   . Hematuria   . History of blood transfusion     "I've had 2 units so far this year" (09/27/2015)  . Anemia   . SAH (subarachnoid hemorrhage) ([place])     post-traumatic (fall) [facility] 12/2014  . PONV (postoperative nausea and vomiting)   . CHF (congestive heart failure) ([place])   . Myocardial infarction ([place]) 2001  . HCAP (healthcare-associated pneumonia) 07/21/2015  . OSA on CPAP   . Chronic kidney disease, stage IV (severe) ([place])   . DM type 2, uncontrolled, with renal complications ([place])   . Colostomy in place [facility])     Medications:  Prescriptions prior to admission  Medication Sig Dispense Refill Last Dose  . acetaminophen-codeine (TYLENOL #3) 300-30 MG tablet Take 1 tablet by mouth every 4 (four) hours as needed.  0 PRN  . amoxicillin (AMOXIL) 500 MG capsule Take 500 mg by mouth 3 (  three) times daily.  0 09/25/2015 at Unknown time  . apixaban (ELIQUIS) 2.5 MG TABS tablet Take 1 tablet (2.5 mg total) by mouth 2 (two) times daily. 60 tablet 6 09/25/2015 at pm  . atorvastatin (LIPITOR) 80 MG tablet Take 1 tablet (80 mg total) by mouth daily. 30 tablet 6 09/25/2015 at Unknown time  . co-enzyme Q-10 50 MG capsule Take 50 mg by mouth every morning.    09/25/2015 at Unknown time  . doxycycline (VIBRAMYCIN) 100 MG capsule Take 100 mg by mouth 2 (two) times daily.  0 09/25/2015 at Unknown time  . ferrous sulfate 325 (65 FE) MG tablet Take 1 tablet (325 mg total) by mouth 2 (two) times daily with a meal. 60 tablet 0 09/25/2015 at Unknown time  . fluticasone (FLONASE) 50 MCG/ACT nasal  spray Place 1 spray into both nostrils daily as needed for allergies.    09/25/2015 at Unknown time  . furosemide (LASIX) 40 MG tablet Take 4 tabs in AM and 3 tabs in PM (Patient taking differently: Take 120-160 mg by mouth 2 (two) times daily. Take 4 tabs in AM and 3 tabs in PM) 210 tablet 3 09/25/2015 at Unknown time  . Insulin Glargine (TOUJEO SOLOSTAR) 300 UNIT/ML SOPN Inject 20 Units into the skin every morning.   09/25/2015 at Unknown time  . metoprolol succinate (TOPROL-XL) 25 MG 24 hr tablet Take 0.5 tablets (12.5 mg total) by mouth 2 (two) times daily. 45 tablet 3 09/25/2015 at pm  . Omega-3 Fatty Acids (FISH OIL) 1000 MG CAPS Take 1,000 mg by mouth 2 (two) times daily.   09/25/2015 at Unknown time  . pantoprazole (PROTONIX) 40 MG tablet Take 1 tablet (40 mg total) by mouth daily. 30 tablet 0 09/25/2015 at Unknown time  . PROAIR RESPICLICK 694 (90 BASE) MCG/ACT AEPB Inhale 2 puffs into the lungs every 6 (six) hours as needed (for breathing).   0 09/25/2015 at Unknown time   Assessment: 59 yo M admitted with CHF exacerbation.   Last Tuesday had R arm AV fistula placed.  Suture came off and looks purulent per consult message. Per admission note RUE AVF site split open.  He is not yet on HD.   Graft site much improved with IV vanc.   Afeb, last wbc was 7.5. Scr fairly stable.  Moderate Coag Neg staph in wound cx - awaiting sensitivities  Goal of Therapy:  Vancomycin trough level 10-15 mcg/ml  Plan:  -Continue vanc 1500mg  q48h for now - plan to give dose prior to d/c tomorrow 10/25 to complete course -f/u renal fxn, wbc, temp, culture data - vanc level as needed  Erin Hearing PharmD., BCPS Clinical Pharmacist Pager [phone] 09/29/2015 11:32 AM

## 2015-09-29 NOTE — Procedures (Signed)
Pt states has a machine at home and states that he recently feels that is has made it cough worse.  Pt states that he will use the machine when he feels better.  Because of this, pt refuses the use of CPAP.

## 2015-09-29 NOTE — Plan of Care (Signed)
Problem: Phase I Progression Outcomes Goal: EF % per last Echo/documented,Core Reminder form on chart Ef 35 % 04/2015

## 2015-09-29 NOTE — Progress Notes (Signed)
Inpatient Diabetes Program Recommendations  AACE/ADA: New Consensus Statement on Inpatient Glycemic Control (2015)  Target Ranges:  Prepandial:   less than 140 mg/dL      Peak postprandial:   less than 180 mg/dL (1-2 hours)      Critically ill patients:  140 - 180 mg/dL   Review of Glycemic Control Morning glucose levels are overall controlled. However afternoon and evening levels are high.  Inpatient Diabetes Program Recommendations:  Correction (SSI): Please add novolog correction insulin tidwc. With renal disease, please order the sensitive correction scale from the "Glycemic Control Order Set"   Thank you Rosita Kea, RN, MSN, CDE  Diabetes Inpatient Program Office: [phone] Pager: [phone] 8:00 am to 5:00 pm

## 2015-09-29 NOTE — Progress Notes (Signed)
Advanced Heart Failure Rounding Note   Subjective:    Diuresing well. Weight down 7 pounds. Denies SOB/Orthopnea.    Wound seen by VVS and felt it was ok. Dry dressing daily.   Labs today Na 132, K 4.3 Creatinine 2.79  Objective:   Weight Range:  Vital Signs:   Temp:  [97.4 F (36.3 C)-97.8 F (36.6 C)] 97.4 F (36.3 C) (10/24 0443) Pulse Rate:  [70-78] 78 (10/24 0443) Resp:  [16-18] 18 (10/24 0443) BP: (90-102)/(49-54) 90/54 mmHg (10/24 0443) SpO2:  [99 %-100 %] 100 % (10/24 0443) Weight:  [208 lb 14.4 oz (94.756 kg)] 208 lb 14.4 oz (94.756 kg) (10/24 0443)    Weight change: Filed Weights   09/27/15 0540 09/28/15 0448 09/29/15 0443  Weight: 225 lb 12.4 oz (102.412 kg) 215 lb 14.4 oz (97.932 kg) 208 lb 14.4 oz (94.756 kg)    Intake/Output:   Intake/Output Summary (Last 24 hours) at 09/29/15 0808 Last data filed at 09/29/15 0700  Gross per 24 hour  Intake    971 ml  Output   6255 ml  Net  -5284 ml     Physical Exam: General: Pleasant, NAD. Sitting in chair.  Psych: Normal affect. Neuro: Alert and oriented X 3. Moves all extremities spontaneously. HEENT: Normal Neck: Supple without bruits. JVP 8  Lungs: Resp regular and unlabored, CTA. Heart: RRR no s3, s4, or murmurs. Abdomen: Soft, non-tender, BS + x 4.   Extremities: No clubbing, cyanosis. DP/PT/Radials 2+ and equal bilaterally. 2+ pitting edema bilateral. RUE AVF  No exudate or erythema Telemetry: vpaced 70s  Labs: Basic Metabolic Panel:  Recent Labs Lab 09/26/15 1820 09/27/15 0520 09/28/15 0237  NA 135 135 132*  K 3.2* 3.2* 3.6  CL 97* 97* 96*  CO2 28 25 25   GLUCOSE 222* 176* 225*  BUN 32* 31* 31*  CREATININE 2.72* 2.64* 2.76*  CALCIUM 9.1 9.0 8.7*    Liver Function Tests:  Recent Labs Lab 09/26/15 1820  AST 35  ALT 14*  ALKPHOS 82  BILITOT 2.4*  PROT 6.8  ALBUMIN 2.6*   No results for input(s): LIPASE, AMYLASE in the last 168 hours. No results for input(s):  AMMONIA in the last 168 hours.  CBC:  Recent Labs Lab 09/26/15 1820  WBC 7.5  NEUTROABS 6.3  HGB 10.1*  HCT 31.5*  MCV 99.1  PLT 131*    Cardiac Enzymes:  Recent Labs Lab 09/26/15 1820 09/26/15 2251 09/27/15 0520  TROPONINI 0.04* 0.05* 0.05*    BNP: BNP (last 3 results)  Recent Labs  07/21/15 0947 08/02/15 2129 09/26/15 1820  BNP 781.6* 1004.6* 1393.4*    ProBNP (last 3 results)  Recent Labs  11/19/14 0745  PROBNP 5233.0*      Other results:  Imaging: No results found.   Medications:     Scheduled Medications: . apixaban  2.5 mg Oral BID  . atorvastatin  80 mg Oral q1800  . ferrous sulfate  325 mg Oral BID WC  . furosemide  120 mg Intravenous BID  . insulin glargine  20 Units Subcutaneous Daily  . metolazone  5 mg Oral BID  . metoprolol succinate  12.5 mg Oral BID  . omega-3 acid ethyl esters  1 g Oral BID  . pantoprazole  40 mg Oral Daily  . potassium chloride  40 mEq Oral BID  . sodium chloride  3 mL Intravenous Q12H  . vancomycin  1,500 mg Intravenous Q48H    Infusions:  PRN Medications: sodium chloride, acetaminophen, albuterol, fluticasone, ondansetron (ZOFRAN) IV, sodium chloride   Assessment:   1. Acute on chronic systolic HF (mainly R heart failure > L) due to iCM EF 20% 2. RUE AVF wound infection 3. CAD with remote anterior MI status post BMS 2: no angina 4. Hiistory of PAF s/p AV nodal ablation: pacer dependent 5. Acute on CKD stage IV: RUE AVF placed by Dr. Eden Lathe in anticipation of HD, has not started HD yet 6. traumatic [facility] 12/2014  7. OSA on CPAP 8. Hypokalemia  Plan/Discussion:    Brisk diuresis noted. Weight down 7 pounds. Renal funciton ok. Creatinine stable. Will continue IV lasix + metolazone.   Discussed importance of daily weights and low salt diet.   Continue vancomycin for AVF wound. Culture NGTD.  Appreciate VVS input.   Length of Stay: 3  Amy Clegg NP-C  09/29/2015, 8:08 AM  Advanced Heart  Failure Team Pager [phone] (M-F; 7a - 4p)  Please contact [facility] for night-coverage after hours (4p -7a ) and weekends on [email or web address]  Patient seen and examined with Darrick Grinder, NP. We discussed all aspects of the encounter. I agree with the assessment and plan as stated above.   Volume status much improved. Needs one more day IV lasix then switch to po torsemide probably 80/40. Renal function better. AVF wound much improved. Continue vancomycin until d/c.   Scorpio Fortin,MD 11:17 AM

## 2015-09-30 ENCOUNTER — Inpatient Hospital Stay (HOSPITAL_COMMUNITY): Payer: Medicare Other

## 2015-09-30 ENCOUNTER — Ambulatory Visit: Payer: Self-pay

## 2015-09-30 ENCOUNTER — Encounter: Payer: Self-pay | Admitting: Internal Medicine

## 2015-09-30 ENCOUNTER — Telehealth: Payer: Self-pay | Admitting: Vascular Surgery

## 2015-09-30 LAB — BASIC METABOLIC PANEL
Anion gap: 12 (ref 5–15)
BUN: 36 mg/dL — ABNORMAL HIGH (ref 6–20)
CALCIUM: 9.5 mg/dL (ref 8.9–10.3)
CO2: 30 mmol/L (ref 22–32)
CREATININE: 3 mg/dL — AB (ref 0.61–1.24)
Chloride: 90 mmol/L — ABNORMAL LOW (ref 101–111)
GFR, EST AFRICAN AMERICAN: 24 mL/min — AB (ref >60)
GFR, EST NON AFRICAN AMERICAN: 21 mL/min — AB (ref >60)
GLUCOSE: 214 mg/dL — AB (ref 65–99)
Potassium: 4.2 mmol/L (ref 3.5–5.1)
Sodium: 132 mmol/L — ABNORMAL LOW (ref 135–145)

## 2015-09-30 LAB — GLUCOSE, CAPILLARY
Glucose-Capillary: 236 mg/dL — ABNORMAL HIGH (ref 65–99)
Glucose-Capillary: 188 mg/dL — ABNORMAL HIGH (ref 65–99)
Glucose-Capillary: 145 mg/dL — ABNORMAL HIGH (ref 65–99)

## 2015-09-30 MED ORDER — TORSEMIDE 20 MG PO TABS: 40.0000 mg | ORAL_TABLET | Freq: Every day | ORAL | Status: DC

## 2015-09-30 MED ORDER — INSULIN ASPART 100 UNIT/ML ~~LOC~~ SOLN
0.0000 [IU] | Freq: Three times a day (TID) | SUBCUTANEOUS | Status: DC
  Administered 2015-09-30: 2 [IU] via SUBCUTANEOUS
  Administered 2015-09-30: 3 [IU] via SUBCUTANEOUS

## 2015-09-30 MED ORDER — TORSEMIDE 20 MG PO TABS: 80.0000 mg | ORAL_TABLET | Freq: Every day | ORAL | Status: DC

## 2015-09-30 MED ORDER — FUROSEMIDE 10 MG/ML IJ SOLN
80.0000 mg | Freq: Once | INTRAMUSCULAR | Status: AC
Start: 2015-09-30 — End: 2015-09-30
  Administered 2015-09-30: 80 mg via INTRAVENOUS
  Filled 2015-09-30: qty 8

## 2015-09-30 MED ORDER — GUAIFENESIN ER 600 MG PO TB12
600.0000 mg | ORAL_TABLET | Freq: Two times a day (BID) | ORAL | Status: DC
  Administered 2015-09-30 – 2015-10-01 (×4): 600 mg via ORAL
  Filled 2015-09-30 (×4): qty 1

## 2015-09-30 NOTE — Progress Notes (Signed)
Advanced Heart Failure Rounding Note   Subjective:    Diuresis slowing.  Weight down another 8  pounds. Denies SOB/Orthopnea.    Wound seen by VVS and felt it was ok. Dry dressing daily.    Objective:   Weight Range:  Vital Signs:   Temp:  [97.4 F (36.3 C)-97.8 F (36.6 C)] 97.4 F (36.3 C) (10/25 0542) Pulse Rate:  [72-75] 75 (10/25 0542) Resp:  [16-18] 18 (10/25 0542) BP: (85-106)/(50-61) 85/52 mmHg (10/25 0542) SpO2:  [97 %-100 %] 100 % (10/25 0542) Weight:  [200 lb 8.6 oz (90.964 kg)] 200 lb 8.6 oz (90.964 kg) (10/25 0542) Last BM Date: 09/29/15  Weight change: Filed Weights   09/28/15 0448 09/29/15 0443 09/30/15 0542  Weight: 215 lb 14.4 oz (97.932 kg) 208 lb 14.4 oz (94.756 kg) 200 lb 8.6 oz (90.964 kg)    Intake/Output:   Intake/Output Summary (Last 24 hours) at 09/30/15 0752 Last data filed at 09/30/15 0544  Gross per 24 hour  Intake   1515 ml  Output   2925 ml  Net  -1410 ml     Physical Exam: General: Pleasant, NAD. Sitting in chair.  Psych: Normal affect. Neuro: Alert and oriented X 3. Moves all extremities spontaneously. HEENT: Normal Neck: Supple without bruits. JVP 8  Lungs: Resp regular and unlabored, LLL ex wheeze.  Heart: RRR no s3, s4, or murmurs. Abdomen: Soft, non-tender, BS + x 4.   Extremities: No clubbing, cyanosis. DP/PT/Radials 2+ and equal bilaterally. 2+ pitting edema bilateral. RUE AVF  No exudate or erythema Telemetry: vpaced 70s  Labs: Basic Metabolic Panel:  Recent Labs Lab 09/26/15 1820 09/27/15 0520 09/28/15 0237 09/29/15 0217  NA 135 135 132* 132*  K 3.2* 3.2* 3.6 4.3  CL 97* 97* 96* 93*  CO2 28 25 25 27   GLUCOSE 222* 176* 225* 201*  BUN 32* 31* 31* 33*  CREATININE 2.72* 2.64* 2.76* 2.79*  CALCIUM 9.1 9.0 8.7* 9.1    Liver Function Tests:  Recent Labs Lab 09/26/15 1820  AST 35  ALT 14*  ALKPHOS 82  BILITOT 2.4*  PROT 6.8  ALBUMIN 2.6*   No results for input(s): LIPASE, AMYLASE in  the last 168 hours. No results for input(s): AMMONIA in the last 168 hours.  CBC:  Recent Labs Lab 09/26/15 1820  WBC 7.5  NEUTROABS 6.3  HGB 10.1*  HCT 31.5*  MCV 99.1  PLT 131*    Cardiac Enzymes:  Recent Labs Lab 09/26/15 1820 09/26/15 2251 09/27/15 0520  TROPONINI 0.04* 0.05* 0.05*    BNP: BNP (last 3 results)  Recent Labs  07/21/15 0947 08/02/15 2129 09/26/15 1820  BNP 781.6* 1004.6* 1393.4*    ProBNP (last 3 results)  Recent Labs  11/19/14 0745  PROBNP 5233.0*      Other results:  Imaging: No results found.   Medications:     Scheduled Medications: . apixaban  2.5 mg Oral BID  . atorvastatin  80 mg Oral q1800  . ferrous sulfate  325 mg Oral BID WC  . furosemide  120 mg Intravenous BID  . guaiFENesin  600 mg Oral BID  . insulin glargine  20 Units Subcutaneous Daily  . metolazone  5 mg Oral BID  . metoprolol succinate  12.5 mg Oral BID  . omega-3 acid ethyl esters  1 g Oral BID  . pantoprazole  40 mg Oral Daily  . potassium chloride  40 mEq Oral BID  . sodium chloride  3 mL  Intravenous Q12H  . vancomycin  1,500 mg Intravenous Q48H    Infusions:    PRN Medications: sodium chloride, acetaminophen, albuterol, fluticasone, ondansetron (ZOFRAN) IV, sodium chloride   Assessment:   1. Acute on chronic systolic HF (mainly R heart failure > L) due to iCM EF 20% 2. RUE AVF wound infection 3. CAD with remote anterior MI status post BMS 2: no angina 4. Hiistory of PAF s/p AV nodal ablation: pacer dependent 5. Acute on CKD stage IV: RUE AVF placed by Dr. Eden Lathe in anticipation of HD, has not started HD yet 6. traumatic [facility] 12/2014  7. OSA on CPAP 8. Hypokalemia  Plan/Discussion:    Volume status improving. Weight down 7 pounds. Renal funciton pending. Give one more dose of IV lasix then switch to torsemide . Switch torsemide 80 mg in am and 40 mg in pm.   2 view CXR . Increased cough. Volume improving. Afebrile.   Discussed  importance of daily weights and low salt diet.   Continue vancomycin for AVF wound. Culture NGTD.  Appreciate VVS input.   Length of Stay: 4  Amy Clegg NP-C  09/30/2015, 7:52 AM  Advanced Heart Failure Team Pager [phone] (M-F; 7a - 4p)  Please contact [facility] for night-coverage after hours (4p -7a ) and weekends on [email or web address]  Patient seen and examined with Darrick Grinder, NP. We discussed all aspects of the encounter. I agree with the assessment and plan as stated above.   Much improved. Volume status looks good. AVF wound looks good. Switch to po torsemide tonight. Likely home in am.   Bensimhon, Daniel,MD 10:23 AM

## 2015-09-30 NOTE — Telephone Encounter (Signed)
LM for pt re appt, dpm °

## 2015-09-30 NOTE — Evaluation (Signed)
Physical Therapy Evaluation Patient Details Name: [name] MRN: [id number] DOB: [birth year] Today's Date: 09/30/2015   History of Present Illness  Pt admitted with volume overload due to R heart failure.  PMH: CAD, remote MI, PAF, stage 4 CKD, OSA, traumatic SAH Jan '15.  Clinical Impression  Patient mobilizing well, no DOE or SOB noted. Patient independent with all aspects of mobility assessment and stair negotiation. NO further acute PT needs, will sign off.    Follow Up Recommendations No PT follow up    Equipment Recommendations  None recommended by PT    Recommendations for Other Services       Precautions / Restrictions Precautions Precautions: Other (comment) (new AVF R UE, 8 lb lifting restriction) Restrictions Weight Bearing Restrictions: No      Mobility  Bed Mobility               General bed mobility comments: pt in chair  Transfers Overall transfer level: Independent Equipment used: None                Ambulation/Gait Ambulation/Gait assistance: Independent Ambulation Distance (Feet): 360 Feet Assistive device: None Gait Pattern/deviations: WFL(Within Functional Limits) Gait velocity: WFL Gait velocity interpretation: at or above normal speed for age/gender General Gait Details: steady with ambulation, no SOB or DOE noted  Stairs Stairs: Yes Stairs assistance: Modified independent (Device/Increase time) Stair Management: Forwards Number of Stairs: 5 General stair comments: no difficulty with stair negotiation  Wheelchair Mobility    Modified Rankin (Stroke Patients Only)       Balance                                             Pertinent Vitals/Pain      Home Living Family/patient expects to be discharged to:: Private residence Living Arrangements: Spouse/significant other Available Help at Discharge: Family;Available 24 hours/day Type of Home: Mobile home Home Access: Stairs to enter Entrance  Stairs-Rails: Can reach both;Left;Right Entrance Stairs-Number of Steps: 2 Home Layout: One level Home Equipment: Walker - 2 wheels      Prior Function Level of Independence: Independent         Comments: Pt drives,      Hand Dominance   Dominant Hand: Right    Extremity/Trunk Assessment   Upper Extremity Assessment: Overall WFL for tasks assessed           Lower Extremity Assessment: Overall WFL for tasks assessed         Communication   Communication: No difficulties  Cognition Arousal/Alertness: Awake/alert Behavior During Therapy: WFL for tasks assessed/performed Overall Cognitive Status: Within Functional Limits for tasks assessed                      General Comments      Exercises        Assessment/Plan    PT Assessment Patent does not need any further PT services  PT Diagnosis Difficulty walking   PT Problem List    PT Treatment Interventions     PT Goals (Current goals can be found in the Care Plan section) Acute Rehab PT Goals Patient Stated Goal: deer hunt PT Goal Formulation: All assessment and education complete, DC therapy    Frequency     Barriers to discharge        Co-evaluation  End of Session Equipment Utilized During Treatment: Gait belt Activity Tolerance: Patient tolerated treatment well Patient left: in chair;with call bell/phone within reach Nurse Communication: Mobility status         Time: 7517-0017 PT Time Calculation (min) (ACUTE ONLY): 11 min   Charges:   PT Evaluation $Initial PT Evaluation Tier I: 1 Procedure     PT G CodesDuncan Dull Oct 18, 2015, 2:36 PM Alben Deeds, [place] DPT  [phone]

## 2015-09-30 NOTE — Progress Notes (Signed)
Responded to spiritual care consult to  Assist patient with advance directive.  I let document with patient to review.  Nurse will call chaplain when patient is ready.

## 2015-09-30 NOTE — Progress Notes (Signed)
Patient states he woke up coughing. Patient states that every time he tries to lay down it makes him cough a lot. Patient requesting coughing aid. Notified on-call doctor, Dr. Aundra Dubin via text page. Orders given for Mucinex to be administered. Will carry out order and continue to monitor patient to end of shift.

## 2015-09-30 NOTE — Progress Notes (Signed)
ERROR IN MAR DOCUMENTATION: 2 units of insulin was given in LEFT arm at 1702. Error in EPIC as it will not let me edit the administration.

## 2015-09-30 NOTE — Plan of Care (Signed)
Problem: Phase II Progression Outcomes Goal: Other Phase II Outcomes/Goals Voiding freely

## 2015-09-30 NOTE — Telephone Encounter (Signed)
-----   Message from Elam Dutch, MD sent at 09/27/2015  9:50 AM EDT ----- Wound check in hospital Please give him follow up appt with me in 2 weeks  Ruta Hinds

## 2015-09-30 NOTE — Consult Note (Signed)
   [facility] CM Inpatient Consult   09/30/2015  SAYGE SALVATO [birth year] [phone]  Patient is currently active with [facility] for chronic disease management services.  Patient has been engaged by a [name].  Our community based plan of care has focused on disease management and community resource support.  Patient will receive a post discharge transition of care call and will be evaluated for monthly home visits for assessments and disease process education. Met with the patient and his wife at the bedside regarding Advanced Directive questions from the Occupation Therapist. Made them aware that the Advanced directives could be done while at the hospital with the [facility].  Patient also had issues with his scales at home.  Wife states that they could purchase scales.  Follow up with their community nurse, Stanton Kidney, as well.   Made Inpatient Case Manager aware that [facility] following. Of note, [facility] Care Management services does not replace or interfere with any services that are arranged by inpatient case management or social work.  For additional questions or referrals please contact: Natividad Brood, RN BSN [facility] Liaison  [phone] business mobile phone

## 2015-09-30 NOTE — Evaluation (Signed)
Occupational Therapy Evaluation Patient Details Name: [name] MRN: [id number] DOB: Jul 25, 1956 Today's Date: 09/30/2015    History of Present Illness Pt admitted with volume overload due to R heart failure.  PMH: CAD, remote MI, PAF, stage 4 CKD, OSA, traumatic SAH Jan '15.   Clinical Impression   Pt is functioning at his baseline in ADL and mobility.  Feels much better now that his weight is down.  No OT needs. Pt asking for chaplain to set up Advanced Directives and reports his scale is broken.  Called Chaplain and informed [facility] nurse of pt's lack of an accurate scale.    Follow Up Recommendations  No OT follow up    Equipment Recommendations  None recommended by OT    Recommendations for Other Services       Precautions / Restrictions Precautions Precautions: Other (comment) (new AVF R UE, 8 lb lifting restriction) Restrictions Weight Bearing Restrictions: No      Mobility Bed Mobility               General bed mobility comments: pt in chair  Transfers Overall transfer level: Modified independent                    Balance                                            ADL Overall ADL's : Independent;At baseline                                             Vision     Perception     Praxis      Pertinent Vitals/Pain Pain Assessment: No/denies pain     Hand Dominance Right   Extremity/Trunk Assessment Upper Extremity Assessment Upper Extremity Assessment: Overall WFL for tasks assessed   Lower Extremity Assessment Lower Extremity Assessment: Overall WFL for tasks assessed       Communication Communication Communication: No difficulties   Cognition Arousal/Alertness: Awake/alert Behavior During Therapy: WFL for tasks assessed/performed Overall Cognitive Status: Within Functional Limits for tasks assessed                     General Comments       Exercises       Shoulder  Instructions      Home Living Family/patient expects to be discharged to:: Private residence Living Arrangements: Spouse/significant other Available Help at Discharge: Family;Available 24 hours/day Type of Home: Mobile home Home Access: Stairs to enter Entrance Stairs-Number of Steps: 2 Entrance Stairs-Rails: Can reach both;Left;Right Home Layout: One level     Bathroom Shower/Tub: Occupational psychologist: Standard (pt with colostomy)     Home Equipment: Walker - 2 wheels          Prior Functioning/Environment Level of Independence: Independent        Comments: Pt drives,     OT Diagnosis: Generalized weakness   OT Problem List:     OT Treatment/Interventions:      OT Goals(Current goals can be found in the care plan section) Acute Rehab OT Goals Patient Stated Goal: deer hunt  OT Frequency:     Barriers to D/C:  Co-evaluation              End of Session Nurse Communication: Other (comment) (ok to call chaplain to do advanced directives)  Activity Tolerance: Patient tolerated treatment well Patient left: in chair;with call bell/phone within reach;with family/visitor present   Time: 2903-7955 OT Time Calculation (min): 24 min Charges:  OT General Charges $OT Visit: 1 Procedure OT Evaluation $Initial OT Evaluation Tier I: 1 Procedure G-Codes:    [name] 09/30/2015, 10:41 AM  [phone]

## 2015-09-30 NOTE — Discharge Instructions (Signed)
Heart Failure  Heart failure means your heart has trouble pumping blood. This makes it hard for your body to work well. Heart failure is usually a long-term (chronic) condition. You must take good care of yourself and follow your doctor's treatment plan.  HOME CARE   Take your heart medicine as told by your doctor.    Do not stop taking medicine unless your doctor tells you to.    Do not skip any dose of medicine.    Refill your medicines before they run out.    Take other medicines only as told by your doctor or pharmacist.   Stay active if told by your doctor. The elderly and people with severe heart failure should talk with a doctor about physical activity.   Eat heart-healthy foods. Choose foods that are without trans fat and are low in saturated fat, cholesterol, and salt (sodium). This includes fresh or frozen fruits and vegetables, fish, lean meats, fat-free or low-fat dairy foods, whole grains, and high-fiber foods. Lentils and dried peas and beans (legumes) are also good choices.   Limit salt if told by your doctor.   Cook in a healthy way. Roast, grill, broil, bake, poach, steam, or stir-fry foods.   Limit fluids as told by your doctor.   Weigh yourself every morning. Do this after you pee (urinate) and before you eat breakfast. Write down your weight to give to your doctor.   Take your blood pressure and write it down if your doctor tells you to.   Ask your doctor how to check your pulse. Check your pulse as told.   Lose weight if told by your doctor.   Stop smoking or chewing tobacco. Do not use gum or patches that help you quit without your doctor's approval.   Schedule and go to doctor visits as told.   Nonpregnant women should have no more than 1 drink a day. Men should have no more than 2 drinks a day. Talk to your doctor about drinking alcohol.   Stop illegal drug use.   Stay current with shots (immunizations).   Manage your health conditions as told by your doctor.   Learn to  manage your stress.   Rest when you are tired.   If it is really hot outside:    Avoid intense activities.    Use air conditioning or fans, or get in a cooler place.    Avoid caffeine and alcohol.    Wear loose-fitting, lightweight, and light-colored clothing.   If it is really cold outside:    Avoid intense activities.    Layer your clothing.    Wear mittens or gloves, a hat, and a scarf when going outside.    Avoid alcohol.   Learn about heart failure and get support as needed.   Get help to maintain or improve your quality of life and your ability to care for yourself as needed.  GET HELP IF:    You gain weight quickly.   You are more short of breath than usual.   You cannot do your normal activities.   You tire easily.   You cough more than normal, especially with activity.   You have any or more puffiness (swelling) in areas such as your hands, feet, ankles, or belly (abdomen).   You cannot sleep because it is hard to breathe.   You feel like your heart is beating fast (palpitations).   You get dizzy or light-headed when you stand up.  GET HELP   RIGHT AWAY IF:    You have trouble breathing.   There is a change in mental status, such as becoming less alert or not being able to focus.   You have chest pain or discomfort.   You faint.  MAKE SURE YOU:    Understand these instructions.   Will watch your condition.   Will get help right away if you are not doing well or get worse.     This information is not intended to replace advice given to you by your health care provider. Make sure you discuss any questions you have with your health care provider.     Document Released: 08/31/2008 Document Revised: 12/13/2014 Document Reviewed: 01/08/2013  Elsevier Interactive Patient Education 2016 [employer] Inc[employer]

## 2015-10-01 LAB — GLUCOSE, CAPILLARY
GLUCOSE-CAPILLARY: 196 mg/dL — AB (ref 65–99)
Glucose-Capillary: 118 mg/dL — ABNORMAL HIGH (ref 65–99)

## 2015-10-01 LAB — BASIC METABOLIC PANEL
Anion gap: 14 (ref 5–15)
BUN: 40 mg/dL — AB (ref 6–20)
CALCIUM: 9.8 mg/dL (ref 8.9–10.3)
CHLORIDE: 88 mmol/L — AB (ref 101–111)
CO2: 31 mmol/L (ref 22–32)
CREATININE: 3.22 mg/dL — AB (ref 0.61–1.24)
GFR calc non Af Amer: 19 mL/min — ABNORMAL LOW (ref >60)
GFR, EST AFRICAN AMERICAN: 22 mL/min — AB (ref >60)
Glucose, Bld: 153 mg/dL — ABNORMAL HIGH (ref 65–99)
Potassium: 4.3 mmol/L (ref 3.5–5.1)
Sodium: 133 mmol/L — ABNORMAL LOW (ref 135–145)

## 2015-10-01 MED ORDER — TORSEMIDE 20 MG PO TABS: ORAL_TABLET | ORAL | Status: DC

## 2015-10-01 MED ORDER — POTASSIUM CHLORIDE ER 20 MEQ PO TBCR: 40.0000 meq | EXTENDED_RELEASE_TABLET | Freq: Every day | ORAL | Status: DC

## 2015-10-01 MED ORDER — TORSEMIDE 20 MG PO TABS: 40.0000 mg | ORAL_TABLET | Freq: Two times a day (BID) | ORAL | Status: DC

## 2015-10-01 NOTE — Progress Notes (Signed)
   10/01/15 1138  Clinical Encounter Type  Visited With Patient;Health care provider  Visit Type Initial  Referral From Patient  [facility] responded to a request from the patient to have advanced directive completed. Chaplain facilitated with social work and volunteers and the advanced directive has been completed. Chaplain support available as needed.   Jeri Lager, Chaplain 10/01/2015 11:41 AM

## 2015-10-01 NOTE — Progress Notes (Signed)
Advanced Heart Failure Rounding Note   Subjective:    Yesterday diuresed with IV lasix and started on torsemide. Diuresis slowing.  Creatinine trending up 3.0>3.2    Denies SOB/Orthopnea. Wants to go home.    Wound seen by [facility] and felt it was ok. Dry dressing daily.    Objective:   Weight Range:  Vital Signs:   Temp:  [97.5 F (36.4 C)-98.1 F (36.7 C)] 97.5 F (36.4 C) (10/26 0442) Pulse Rate:  [66-75] 66 (10/26 0442) Resp:  [16-17] 17 (10/26 0442) BP: (92-103)/(55-71) 92/59 mmHg (10/26 0442) SpO2:  [98 %-100 %] 98 % (10/26 0442) Weight:  [193 lb 12.8 oz (87.907 kg)] 193 lb 12.8 oz (87.907 kg) (10/26 0442) Last BM Date: 09/30/15  Weight change: Filed Weights   09/29/15 0443 09/30/15 0542 10/01/15 0442  Weight: 208 lb 14.4 oz (94.756 kg) 200 lb 8.6 oz (90.964 kg) 193 lb 12.8 oz (87.907 kg)    Intake/Output:   Intake/Output Summary (Last 24 hours) at 10/01/15 0843 Last data filed at 10/01/15 0800  Gross per 24 hour  Intake   1340 ml  Output   3250 ml  Net  -1910 ml     Physical Exam: General: Pleasant, NAD. Sitting in chair.  Psych: Normal affect. Neuro: Alert and oriented X 3. Moves all extremities spontaneously. HEENT: Normal Neck: Supple without bruits. JVP  5-6  Lungs: Resp regular and unlabored, Lungs clear. LL ex wheeze.  Heart: RRR no s3, s4, or murmurs. Abdomen: Soft, non-tender, BS + x 4.   Extremities: No clubbing, cyanosis. DP/PT/Radials 2+ and equal bilaterally. Trace-1+ edema bilateral. RUE AVF  No exudate or erythema Telemetry: vpaced 70s  Labs: Basic Metabolic Panel:  Recent Labs Lab 09/27/15 0520 09/28/15 0237 09/29/15 0217 09/30/15 0947 10/01/15 0213  NA 135 132* 132* 132* 133*  K 3.2* 3.6 4.3 4.2 4.3  CL 97* 96* 93* 90* 88*  CO2 25 25 27 30 31   GLUCOSE 176* 225* 201* 214* 153*  BUN 31* 31* 33* 36* 40*  CREATININE 2.64* 2.76* 2.79* 3.00* 3.22*  CALCIUM 9.0 8.7* 9.1 9.5 9.8    Liver Function Tests:  Recent  Labs Lab 09/26/15 1820  AST 35  ALT 14*  ALKPHOS 82  BILITOT 2.4*  PROT 6.8  ALBUMIN 2.6*   No results for input(s): LIPASE, AMYLASE in the last 168 hours. No results for input(s): AMMONIA in the last 168 hours.  CBC:  Recent Labs Lab 09/26/15 1820  WBC 7.5  NEUTROABS 6.3  HGB 10.1*  HCT 31.5*  MCV 99.1  PLT 131*    Cardiac Enzymes:  Recent Labs Lab 09/26/15 1820 09/26/15 2251 09/27/15 0520  TROPONINI 0.04* 0.05* 0.05*    BNP: BNP (last 3 results)  Recent Labs  07/21/15 0947 08/02/15 2129 09/26/15 1820  BNP 781.6* 1004.6* 1393.4*    ProBNP (last 3 results)  Recent Labs  11/19/14 0745  PROBNP 5233.0*      Other results:  Imaging: Dg Chest 2 View  09/30/2015  CLINICAL DATA:  Productive cough EXAM: CHEST - 2 VIEW COMPARISON:  09/26/2015 FINDINGS: Cardiac shadow remains enlarged but stable. A defibrillator is again seen and stable. The lungs are well aerated bilaterally. Small pleural effusions are again noted. Degenerative changes of the thoracic spine are again seen. IMPRESSION: No significant interval change from the prior exam. Electronically Signed   By: Inez Catalina M.D.   On: 09/30/2015 10:32     Medications:     Scheduled  Medications: . apixaban  2.5 mg Oral BID  . atorvastatin  80 mg Oral q1800  . ferrous sulfate  325 mg Oral BID WC  . guaiFENesin  600 mg Oral BID  . insulin aspart  0-9 Units Subcutaneous TID WC  . insulin glargine  20 Units Subcutaneous Daily  . metoprolol succinate  12.5 mg Oral BID  . omega-3 acid ethyl esters  1 g Oral BID  . pantoprazole  40 mg Oral Daily  . potassium chloride  40 mEq Oral BID  . sodium chloride  3 mL Intravenous Q12H  . torsemide  40 mg Oral q1800  . torsemide  80 mg Oral Daily  . vancomycin  1,500 mg Intravenous Q48H    Infusions:    PRN Medications: sodium chloride, acetaminophen, albuterol, fluticasone, ondansetron (ZOFRAN) IV, sodium chloride   Assessment:   1. Acute on  chronic systolic HF (mainly R heart failure > L) due to iCM EF 20% 2. RUE AVF wound infection 3. CAD with remote anterior MI status post BMS 2: no angina 4. Hiistory of PAF s/p AV nodal ablation: pacer dependent 5. Acute on CKD stage IV: RUE AVF placed by Dr. Eden Lathe in anticipation of HD, has not started HD yet 6. traumatic [facility] 12/2014  7. OSA on CPAP 8. Hypokalemia  Plan/Discussion:    Volume status stable. Weight down another 7 pounds. Overall 32 pounds. Creatinine bump noted. Hold diuretics and potassium today. Start torsemide tomorrow 40 mg/40 mg.   CXR was ok.     Discussed importance of daily weights and low salt diet.   Continue vancomycin for AVF wound. Culture NGTD.  Appreciate [facility] input.   Length of Stay: [place] NP-C  10/01/2015, 8:43 AM  Advanced Heart Failure Team Pager [phone] (M-F; [place])  Please contact [facility] for night-coverage after hours (4p -7a ) and weekends on [email or web address]  Patient seen and examined with Darrick Grinder, NP. We discussed all aspects of the encounter. I agree with the assessment and plan as stated above.   He is ready for d/c today. Recently on lasix 120/80 but bumped to 160/80 prior to admit. Would d/c home on torsemide 40 bid and adjust as needed. AVF wound healed. Can stop vancomycin. Needs HF appt next week.  Bensimhon, Daniel,MD 11:24 AM

## 2015-10-01 NOTE — Progress Notes (Signed)
Patient BP 85/50, asymptomatic, MD notified. MD said it is okay to give 12.5mg  of metoprolol. Medication given per MD instruction. Will continue to monitor patient.

## 2015-10-01 NOTE — Discharge Summary (Signed)
Advanced Heart Failure Team  Discharge Summary   Patient ID: [name] MRN: [id number], DOB/AGE: 14-Oct-1956 59 y.o. Admit date: 09/26/2015 D/C date:     10/01/2015   Primary Discharge Diagnoses:  1. Acute on chronic systolic HF (mainly R heart failure > L) due to iCM EF 20% 2. RUE AVF wound infection 3. CAD with remote anterior MI status post BMS 2: no angina 4. Hiistory of PAF s/p AV nodal ablation: pacer dependent 5. Acute on CKD stage IV: RUE AVF placed by Dr. Jettie Booze in anticipation of HD, has not started HD yet 6. traumatic [facility] 12/2014  7. OSA on CPAP 8. Hypokalemia  Hospital Course:    [name] is a 59 year old with a history of CAD with remote anterior MI status post BMS 2, ICM with baseline EF 20% s/p St Jude CRT-D with device change in May 2016, history of PAF s/p AV nodal ablation, CKD stage IV, traumatic SAH 12/2014 and OSA on CPAP admitted with dyspnea with volume overload.   Admitted with marked volume overload in the setting of high salt diet. Diuresed with IV lasix and transitioned to torsemide. Creatinine bumped so diuretics held on the day discharge. He will start torsemide 40 mg twice a day on 10/27. Overall he diuresed 32 pounds.   Vascular Surgery consulted due to concern for RUE AVF infection with recommendations to continue conservative topical wound care. Wound culture had no growth. At the time of discharge his wound had completely healed. He also received course of vancomycin.   He was counseled on the importance of low salt diet and limiting fluid intake. He will continue to be followed closely in the HF clinic. Plan to check BMET at that time.   Discharge Weight Range:  193 pounds  Discharge Vitals: Blood pressure 85/50, pulse 73, temperature 97.5 F (36.4 C), temperature source Oral, resp. rate 17, height 5\' 10"  (1.778 m), weight 193 lb 12.8 oz (87.907 kg), SpO2 98 %.  Labs: Lab Results  Component Value Date   WBC 7.5 09/26/2015   HGB 10.1*  09/26/2015   HCT 31.5* 09/26/2015   MCV 99.1 09/26/2015   PLT 131* 09/26/2015    Recent Labs Lab 09/26/15 1820  10/01/15 0213  NA 135  < > 133*  K 3.2*  < > 4.3  CL 97*  < > 88*  CO2 28  < > 31  BUN 32*  < > 40*  CREATININE 2.72*  < > 3.22*  CALCIUM 9.1  < > 9.8  PROT 6.8  --   --   BILITOT 2.4*  --   --   ALKPHOS 82  --   --   ALT 14*  --   --   AST 35  --   --   GLUCOSE 222*  < > 153*  < > = values in this interval not displayed. Lab Results  Component Value Date   CHOL 186 11/20/2014   HDL 27* 11/20/2014   LDLCALC 123* 11/20/2014   TRIG 180* 11/20/2014   BNP (last 3 results)  Recent Labs  07/21/15 0947 08/02/15 2129 09/26/15 1820  BNP 781.6* 1004.6* 1393.4*    ProBNP (last 3 results)  Recent Labs  11/19/14 0745  PROBNP 5233.0*     Diagnostic Studies/Procedures   Dg Chest 2 View  09/30/2015  CLINICAL DATA:  Productive cough EXAM: CHEST - 2 VIEW COMPARISON:  09/26/2015 FINDINGS: Cardiac shadow remains enlarged but stable. A defibrillator is again seen and  stable. The lungs are well aerated bilaterally. Small pleural effusions are again noted. Degenerative changes of the thoracic spine are again seen. IMPRESSION: No significant interval change from the prior exam. Electronically Signed   By: Alcide Clever M.D.   On: 09/30/2015 10:32    Discharge Medications     Medication List    STOP taking these medications        amoxicillin 500 MG capsule  Commonly known as:  AMOXIL     doxycycline 100 MG capsule  Commonly known as:  VIBRAMYCIN     furosemide 40 MG tablet  Commonly known as:  LASIX      TAKE these medications        acetaminophen-codeine 300-30 MG tablet  Commonly known as:  TYLENOL #3  Take 1 tablet by mouth every 4 (four) hours as needed.     apixaban 2.5 MG Tabs tablet  Commonly known as:  ELIQUIS  Take 1 tablet (2.5 mg total) by mouth 2 (two) times daily.     atorvastatin 80 MG tablet  Commonly known as:  LIPITOR  Take 1  tablet (80 mg total) by mouth daily.     co-enzyme Q-10 50 MG capsule  Take 50 mg by mouth every morning.     ferrous sulfate 325 (65 FE) MG tablet  Take 1 tablet (325 mg total) by mouth 2 (two) times daily with a meal.     Fish Oil 1000 MG Caps  Take 1,000 mg by mouth 2 (two) times daily.     fluticasone 50 MCG/ACT nasal spray  Commonly known as:  FLONASE  Place 1 spray into both nostrils daily as needed for allergies.     Insulin Glargine 300 UNIT/ML Sopn  Commonly known as:  TOUJEO SOLOSTAR  Inject 20 Units into the skin every morning.     metoprolol succinate 25 MG 24 hr tablet  Commonly known as:  TOPROL-XL  Take 0.5 tablets (12.5 mg total) by mouth 2 (two) times daily.     pantoprazole 40 MG tablet  Commonly known as:  PROTONIX  Take 1 tablet (40 mg total) by mouth daily.     PROAIR RESPICLICK 108 (90 BASE) MCG/ACT Aepb  Generic drug:  Albuterol Sulfate  Inhale 2 puffs into the lungs every 6 (six) hours as needed (for breathing).     torsemide 20 MG tablet  Commonly known as:  DEMADEX  Take 2 tablets (40 mg total) by mouth 2 (two) times daily. Take torsemide 80 mg in am and 40 mg in pm        Disposition   The patient will be discharged in stable condition to home. Discharge Instructions    Contraindication to ACEI at discharge    Complete by:  As directed      Diet - low sodium heart healthy    Complete by:  As directed      Heart Failure patients record your daily weight using the same scale at the same time of day    Complete by:  As directed      Increase activity slowly    Complete by:  As directed           Follow-up Information    Follow up with Arvilla Meres, MD On 10/14/2015.   Specialty:  Cardiology   Why:  9:20 Garage Code 9000   Contact information:   [address] [place] [address] [phone]  Duration of Discharge Encounter: Greater than 35 minutes   Signed, Amy Clegg NP-C  10/01/2015, 11:27  AM  Patient seen and examined with Tonye Becket, NP. We discussed all aspects of the encounter. I agree with the assessment and plan as stated above.   Volume status much improved. Renal functions table. Ready for d/c today.   Robert Labrada,MD 12:22 PM

## 2015-10-01 NOTE — Progress Notes (Signed)
   10/01/15 1000  Clinical Encounter Type  Visited With Patient  Visit Type Initial (Advanced Directive - initial)  Referral From Patient;Nurse;Chaplain  Spiritual Encounters  Spiritual Needs Literature  Advance Directives (For Healthcare)  Does patient have an advance directive? No  Would patient like information on creating an advanced directive? Yes - Scientist, clinical (histocompatibility and immunogenetics) given (New form given)  Type of Paramedic of [place];Living will  Copy of advanced directive(s) in chart? No - copy requested  [place] met and did an assessment of pt to be able to complete Adv. Directive; pt was able to verbally answer questions, RN was present; [facility] gave materials for Adv. Directive; RN will contact [place] when document filled out. Gwynn Burly 10:06 AM

## 2015-10-02 ENCOUNTER — Other Ambulatory Visit: Payer: Self-pay | Admitting: *Deleted

## 2015-10-02 NOTE — Patient Outreach (Signed)
Transition of Care week #1 call attempt  Call to patient home. No answer, left VM with RNCM contact information. [name]. [name], RN, BSN, [place] [phone]

## 2015-10-03 ENCOUNTER — Other Ambulatory Visit: Payer: Self-pay | Admitting: *Deleted

## 2015-10-03 ENCOUNTER — Encounter (HOSPITAL_COMMUNITY): Payer: Medicare Other

## 2015-10-03 NOTE — Patient Outreach (Signed)
Transition of Care week #1 outreach call No answer to call,left VM with RNCM contact and requested call back. Will follow up again next week. [name]. [name], RN, BSN, [place] [phone]

## 2015-10-06 ENCOUNTER — Other Ambulatory Visit: Payer: Self-pay | Admitting: *Deleted

## 2015-10-06 NOTE — Patient Outreach (Signed)
Transition of Care week #1  Call to patient home, no answer, left a message with RNCM contact requesting a call back. Call to patient wife cell, no answer, left message with RNCM contact, requesting call back. Plan to continue to attempt [facility] calls [facility] E. [name], RN, BSN, [place] [phone]

## 2015-10-07 ENCOUNTER — Encounter: Payer: Self-pay | Admitting: Vascular Surgery

## 2015-10-08 ENCOUNTER — Other Ambulatory Visit: Payer: Self-pay | Admitting: Pharmacist

## 2015-10-08 NOTE — Patient Outreach (Signed)
[facility]) Care Management  [place]   10/08/2015  BADER STUBBLEFIELD June 13, 1956 [id number]  Subjective: [name] is a 59yo with a recent hospitalization from 09/26/15 to 10/01/15 for acute on chronic systolic heart failure.  I identified patient as eligible for pharmacy transition of care project.  Medication review completed.  I called patient to assess medication adherence but was unable to reach him.      Objective:   Current Medications: Current Outpatient Prescriptions  Medication Sig Dispense Refill  . acetaminophen-codeine (TYLENOL #3) 300-30 MG tablet Take 1 tablet by mouth every 4 (four) hours as needed.  0  . apixaban (ELIQUIS) 2.5 MG TABS tablet Take 1 tablet (2.5 mg total) by mouth 2 (two) times daily. 60 tablet 6  . atorvastatin (LIPITOR) 80 MG tablet Take 1 tablet (80 mg total) by mouth daily. 30 tablet 6  . co-enzyme Q-10 50 MG capsule Take 50 mg by mouth every morning.     . ferrous sulfate 325 (65 FE) MG tablet Take 1 tablet (325 mg total) by mouth 2 (two) times daily with a meal. 60 tablet 0  . fluticasone (FLONASE) 50 MCG/ACT nasal spray Place 1 spray into both nostrils daily as needed for allergies.     . Insulin Glargine (TOUJEO SOLOSTAR) 300 UNIT/ML SOPN Inject 20 Units into the skin every morning.    . metoprolol succinate (TOPROL-XL) 25 MG 24 hr tablet Take 0.5 tablets (12.5 mg total) by mouth 2 (two) times daily. 45 tablet 3  . Omega-3 Fatty Acids (FISH OIL) 1000 MG CAPS Take 1,000 mg by mouth 2 (two) times daily.    . pantoprazole (PROTONIX) 40 MG tablet Take 1 tablet (40 mg total) by mouth daily. 30 tablet 0  . PROAIR RESPICLICK 621 (90 BASE) MCG/ACT AEPB Inhale 2 puffs into the lungs every 6 (six) hours as needed (for breathing).   0  . torsemide (DEMADEX) 20 MG tablet Take 2 tablets (40 mg total) by mouth 2 (two) times daily. Take torsemide 80 mg in am and 40 mg in pm 120 tablet 6   No current facility-administered medications for  this visit.    Functional Status: In your present state of health, do you have any difficulty performing the following activities: 09/27/2015 09/15/2015  Hearing? N N  Vision? N N  Difficulty concentrating or making decisions? N N  Walking or climbing stairs? N Y  Dressing or bathing? N N  Doing errands, shopping? N N  Preparing Food and eating ? - N  Using the Toilet? - N  In the past six months, have you accidently leaked urine? - N  Do you have problems with loss of bowel control? - N  Managing your Medications? - Y  Managing your Finances? - N  Housekeeping or managing your Housekeeping? - N    Fall/Depression Screening: PHQ 2/9 Scores 09/15/2015  PHQ - 2 Score 0    Assessment: 1.  Medication review:   Drugs sorted by system:  Neurologic/Psychologic:  none  Cardiovascular:   apixaban, atorvastatin, metoprolol succinate, omega-3 fatty acids, torsemide  Pulmonary/Allergy:  albuterol RespiClick, fluticasone nasal spray  Gastrointestinal:  pantoprazole  Endocrine:  Lantus  Renal:  none  Topical:  none  Pain:  acetaminophen-codeine  Miscellaneous:  coenzyme Q-10, ferrous sulfate   Duplications in therapy:  None noted  Gaps in therapy:  Aspirin for CAD; ACEi or ARB for LVSD/CAD Medications to avoid in the elderly:  Patient <65 Drug interactions:  None noted Other issues noted:  Apixaban for atrial fibrillation has been dose reduced to 2.5mg  BID  2.  Medication adherence: unable to assess  Plan: 1.  Medication Review:  All medications appear appropriate based on patient's problem list.  Apixaban has been dose reduced to 2.5mg  BID (patient only has 1 criteria for dose reduction, not the required 2 criteria).  Noted through chart review that patient has a history of colostomy stoma bleed in September 2016 while on apixaban 5mg  BID.  Apixaban was dose reduced by cardiology due to bleed.  Patient has history of CAD which is indication for aspirin therapy.  However,  patient is not currently on aspirin due to anticoagulation use and history of bleed.  Patient has LVSD and CAD which are both compelling indications for ACEi/ARB use.  Chart review indicates patient has contraindication to ACEi/ARB due to symptomatic hypotension (BP 80-90s/50-60s).  I believe risk of ACEi/ARB use outweigh benefits of ACEi/ARB at this time.   2.  Medication adherence: Unable to assess 3.  Patient has hospital follow up with cardiology on 10/14/15 at 9:20AM.    Elisabeth Most, Pharm.D. Pharmacy Resident [place] [phone]

## 2015-10-09 ENCOUNTER — Ambulatory Visit (INDEPENDENT_AMBULATORY_CARE_PROVIDER_SITE_OTHER): Payer: Medicare Other | Admitting: Vascular Surgery

## 2015-10-09 ENCOUNTER — Encounter: Payer: Self-pay | Admitting: Vascular Surgery

## 2015-10-09 ENCOUNTER — Encounter: Payer: Self-pay | Admitting: *Deleted

## 2015-10-09 VITALS — BP 97/62 | HR 70 | Temp 97.6°F | Ht 70.0 in | Wt 189.0 lb

## 2015-10-09 DIAGNOSIS — N184 Chronic kidney disease, stage 4 (severe): Secondary | ICD-10-CM

## 2015-10-09 NOTE — Progress Notes (Signed)
Patient is a 59 year old male who returns for postoperative follow-up today. He had creation of a right brachiocephalic AV fistula on October 11. He denies any numbness or tingling in his hand. He did have some slight separation and maceration of his antecubital incision and was evaluated during a recent hospital admission for congestive failure. The wound is now completely healed.  Physical exam:  Filed Vitals:   10/09/15 0831  BP: 97/62  Pulse: 70  Temp: 97.6 F (36.4 C)  TempSrc: Oral  Height: 5\' 10"  (1.778 m)  Weight: 189 lb (85.73 kg)  SpO2: 100%    Right upper extremity: Well-healed antecubital incision palpable thrill audible bruit in fistula  Assessment: Maturing right arm AV fistula no complications  Plan: Patient will follow-up in 6 weeks with a duplex scan of his AV fistula at that point. He will continue to exercise the fistula in the meantime.  Ruta Hinds, MD [facility] Office: [phone] Pager: [phone]

## 2015-10-09 NOTE — Addendum Note (Signed)
Addended by: Dorthula Rue L on: 10/09/2015 01:48 PM   Modules accepted: Orders

## 2015-10-09 NOTE — Patient Outreach (Signed)
No call back from patient will send unable to outreach letter. [name]. [name], RN, BSN, [place] [phone]

## 2015-10-13 NOTE — Progress Notes (Signed)
Per discussion with Dr. Gala Romney, it is reasonable to move up his surveillance TCS at next visit given polyp found on colonoscopy which was not removed. Can address at next office visit.

## 2015-10-14 ENCOUNTER — Ambulatory Visit (HOSPITAL_COMMUNITY)
Admit: 2015-10-14 | Discharge: 2015-10-14 | Disposition: A | Discharge status: Home / Self Care | Payer: Medicare Other | Source: Ambulatory Visit | Attending: Internal Medicine | Admitting: Internal Medicine

## 2015-10-14 ENCOUNTER — Encounter (HOSPITAL_COMMUNITY): Payer: Self-pay | Admitting: Internal Medicine

## 2015-10-14 VITALS — BP 110/62 | HR 75 | Wt 190.2 lb

## 2015-10-14 DIAGNOSIS — Z85048 Personal history of other malignant neoplasm of rectum, rectosigmoid junction, and anus: Secondary | ICD-10-CM | POA: Insufficient documentation

## 2015-10-14 DIAGNOSIS — I482 Chronic atrial fibrillation, unspecified: Secondary | ICD-10-CM

## 2015-10-14 DIAGNOSIS — I5022 Chronic systolic (congestive) heart failure: Secondary | ICD-10-CM | POA: Diagnosis not present

## 2015-10-14 DIAGNOSIS — N184 Chronic kidney disease, stage 4 (severe): Secondary | ICD-10-CM | POA: Diagnosis not present

## 2015-10-14 DIAGNOSIS — E785 Hyperlipidemia, unspecified: Secondary | ICD-10-CM | POA: Diagnosis not present

## 2015-10-14 DIAGNOSIS — I2510 Atherosclerotic heart disease of native coronary artery without angina pectoris: Secondary | ICD-10-CM | POA: Diagnosis not present

## 2015-10-14 DIAGNOSIS — E119 Type 2 diabetes mellitus without complications: Secondary | ICD-10-CM | POA: Insufficient documentation

## 2015-10-14 LAB — BASIC METABOLIC PANEL
Anion gap: 14 (ref 5–15)
BUN: 76 mg/dL — AB (ref 6–20)
CO2: 29 mmol/L (ref 22–32)
CREATININE: 3.06 mg/dL — AB (ref 0.61–1.24)
Calcium: 9.7 mg/dL (ref 8.9–10.3)
Chloride: 90 mmol/L — ABNORMAL LOW (ref 101–111)
GFR calc Af Amer: 24 mL/min — ABNORMAL LOW (ref >60)
GFR, EST NON AFRICAN AMERICAN: 21 mL/min — AB (ref >60)
Glucose, Bld: 416 mg/dL — ABNORMAL HIGH (ref 65–99)
Potassium: 3.7 mmol/L (ref 3.5–5.1)
SODIUM: 133 mmol/L — AB (ref 135–145)

## 2015-10-14 MED ORDER — APIXABAN 5 MG PO TABS: 5.0000 mg | ORAL_TABLET | Freq: Two times a day (BID) | ORAL | Status: DC

## 2015-10-14 MED ORDER — TORSEMIDE 20 MG PO TABS: ORAL_TABLET | ORAL | Status: DC

## 2015-10-14 NOTE — Patient Instructions (Signed)
INCREASE Eliquis to 5mg  twice a day.  Routine lab work today. (bmet) Will notify you of abnormal results  FOLLOW UP: 4 weeks with Dr.Bensimhon  Do the following things EVERYDAY: 1) Weigh yourself in the morning before breakfast. Write it down and keep it in a log. 2) Take your medicines as prescribed 3) Eat low salt foods-Limit salt (sodium) to 2000 mg per day.  4) Stay as active as you can everyday 5) Limit all fluids for the day to less than 2 liters

## 2015-10-14 NOTE — Addendum Note (Signed)
Encounter addended by: Harvie Junior, CMA on: 10/14/2015  9:50 AM<BR>     Documentation filed: Dx Association, Patient Instructions Section, Orders

## 2015-10-14 NOTE — Progress Notes (Signed)
ADVANCED HF CLINIC NOTE  Patient ID: [name], male   DOB: 1956/09/20, 59 y.o.   MRN: [id number] PCP: Dr. Sherwood [name] Oncologist: Dr [name].  CHF: [name]  59 yo with history of CAD and ischemic cardiomyopathy, BiV ICD upgrade 2009, chronic atrial fibrillation s/p AVN ablation 9/16, CKD, and traumatic SAH in 1/16 after a fall.  He has been followed at the heart failure clinic at [facility] by Dr [name] in the past.    He was admitted in 3/16 from the [facility] ER with exertional dyspnea/volume overload and had a cardiac arrest/ventricular fibrillation while in the hospital terminated by his ICD.  After diuresis, RHC showed relatively normal filling pressures and preserved cardiac index.  Creatinine peaked at 3.0. (was 5.0 in 1/16).  Echo in 3/16 showed EF 25-30% with restrictive diastolic function.  Cardiolite was done, showing areas of scar but no ischemia.  EF 18%.    Admitted 4/16 with hematuria and elevated LFTs. Questionable amiodarone toxicity and this was stopped.   In 5/16 Underwent elective BiV ICD generator change which went without complication. He was admitted again 04/23/15 with acute on chronic CHF and respiratory failure. During that admission he ruled in for a NSTEMI- Troponin 4.29. He has chronic stage 4 CKD and it was decided to obtain a Myoview before considering a cath. Myoview done 04/26/15 showed EF 20% with large region of prior infarct involving the apical anterior, anteroseptal, inferoseptal and inferior walls with extension to the true apex. Small region of reversible/inducible ischemia along the margin of the prior infarct at the mid ventricular anterior and anteroseptal walls. No change from previous.   Admitted to [facility] in 8/16 for increased dyspnea and volume overload. Diuresed with IV lasix and transitioned to 40 mg daily. D/C weight  200 pounds.  Patient was admitted in 9/16 with bleeding from stoma site and atrial fibrillation with RVR.  Eliquis was held and  bleeding stopped.  Eliquis was restarted prior to discharge. He was noted to have < 50% BiV pacing and also to be very volume overloaded. He was then diuresed and had AV nodal ablation.    Readmitted 10/16 with volume overload and worsening renal failure.. Diuresed 32 pounds. Went home 189 pounds. Switched to torsemide on d/c 80/40  Says he feels great weight stable 183-184. No edema.  Breathing easier.  No dyspnea walking on flat ground.  Able to walk around grocery store.  No chest pain.  Has chronic orthopnea and sleeps in recliner.   Corevue: Impedance trending down.    Labs (12/15): LDL 123 Labs (3/16): K 3.8, creatinine 2.78 Labs (03/10/2015): K 4.5 Creatinine 2.49  TSH 4.58, LFTs normal, digoxin 0.8 Labs (04/26/15): K 3.5 creatinine 2.13 Labs (08/04/2015): K 3.3 Creatinine 3.01  Labs (9/16): K 4.1, creatinine 2.7, HCT 30.8  PMH: 1. HTN 2. Type II diabetes 3. CAD: s/p BMS LAD in 2001, PTCA ramus and BMS LAD in 2009.  Cardiolite (3/16) with EF 18%, no ischemia, prior anterior, apical and inferior infarction. NSTEMI in 5/16.  Myoview done 04/26/15 showed EF 20% with large region of prior infarct involving the apical anterior, anteroseptal, inferoseptal and inferior walls with extension to the true apex. Small region of reversible/inducible ischemia along the margin of the prior infarct at the mid ventricular anterior and anteroseptal walls. No change from previous.  4. Chronic systolic CHF: Ischemic CMP.  St Jude CRT-D.  Echo (3/16) with EF 25-30%, restrictive diastolic function, mild LVH, mild MR.  RHC (3/16)  with mean RA 9, PA 47/29, mean PCWP 16, CI 2.5 (Fick).  Suspect ACEI cough. Echo (5/16) with EF 35%.   5. SAH: 1/16 after fall (traumatic).   6. GERD 7. Atrial fibrillation: Paroxysmal initially but became persistent.  Now s/p AV nodal ablation to allow BiV pacing. 8. Rectal cancer: s/p surgery. Has colostomy.  9. Prostate cancer: s/p chemo/radiation.  10. H/o TIA 11. OSA: On CPAP.    12. Hyperlipidemia 13. CKD stage IV: Followed by Dr [name].  14. Bleeding from stoma.   SH: Married, lives in [place], nonsmoker.   FH: CAD  ROS: All systems reviewed and negative except as per HPI.   Current Outpatient Prescriptions  Medication Sig Dispense Refill  . acetaminophen-codeine (TYLENOL #3) 300-30 MG tablet Take 1 tablet by mouth every 4 (four) hours as needed.  0  . apixaban (ELIQUIS) 2.5 MG TABS tablet Take 1 tablet (2.5 mg total) by mouth 2 (two) times daily. 60 tablet 6  . atorvastatin (LIPITOR) 80 MG tablet Take 1 tablet (80 mg total) by mouth daily. 30 tablet 6  . co-enzyme Q-10 50 MG capsule Take 50 mg by mouth every morning.     . ferrous sulfate 325 (65 FE) MG tablet Take 1 tablet (325 mg total) by mouth 2 (two) times daily with a meal. 60 tablet 0  . fluticasone (FLONASE) 50 MCG/ACT nasal spray Place 1 spray into both nostrils daily as needed for allergies.     . Insulin Glargine (TOUJEO SOLOSTAR) 300 UNIT/ML SOPN Inject 20 Units into the skin every morning.    . metoprolol succinate (TOPROL-XL) 25 MG 24 hr tablet Take 0.5 tablets (12.5 mg total) by mouth 2 (two) times daily. 45 tablet 3  . Omega-3 Fatty Acids (FISH OIL) 1000 MG CAPS Take 1,000 mg by mouth 2 (two) times daily.    . pantoprazole (PROTONIX) 40 MG tablet Take 1 tablet (40 mg total) by mouth daily. 30 tablet 0  . PROAIR RESPICLICK 108 (90 BASE) MCG/ACT AEPB Inhale 2 puffs into the lungs every 6 (six) hours as needed (for breathing).   0  . torsemide (DEMADEX) 20 MG tablet Take by mouth. Take 80mg  in the AM and 40mg  in the PM    . torsemide (DEMADEX) 20 MG tablet Take 80mg  in the AM and 40mg  in the PM 180 tablet 6   No current facility-administered medications for this encounter.    Filed Vitals:   10/14/15 0912  BP: 110/62  Pulse: 75  Weight: 190 lb 4 oz (86.297 kg)  SpO2: 100%    General: NAD Ambulated in the clinic without difficutly.  Neck: JVP 6 cm.   no thyromegaly or thyroid  nodule.  Lungs: Clear to auscultation bilaterally with normal respiratory effort. CV: Nondisplaced PMI.  Regular S1S2, 2/6 SEM RUSB.  no edema.  No carotid bruit.  Normal pedal pulses.  Abdomen: Soft, nontender, no hepatosplenomegaly, no distention. S/p colostomy.  Skin: Intact without lesions or rashes.  Neurologic: Alert and oriented x 3.  Psych: Normal affect. Extremities: No clubbing or cyanosis.  HEENT: Normal.   Assessment/Plan: 1. Chronic systolic CHF: Ischemic cardiomyopathy.  Has been followed for a long time at [facility].  Deemed not transplant or VAD candidate with comorbities and advanced CKD. Has St Jude CRT-D, now s/p AV nodal ablation to promote BiV pacing.  Breathing much better since AV nodal ablation (NYHA class II-III).  Impedance looks good on Corevue.  - Much improved on torsemide. Weight stable.  -  Continue Toprol 25 bid.  - No ACEI, spironolactone, digoxin with advanced CKD.  - SBP 105-110. Will no restart hydral/NTG as I would like to keep SBP > 110 to preserve renal function - Given CKD and previous cancer not good VAD or transplant candidate (would need heart/kidney)  2. Atrial fibrillation: Chronic. CHADS2Vasc Score 5.  --Now s/p AV nodal ablation, feels much better (improved BiV pacing).   -- He has had recurrent stoma bleeding on Eliquis. Now on low-dose Eliquis 2.5 bid. Now that stoma healed will try to increase 5 bid. 3. CAD:  NSTEMI in 5/16. Cardiolite without high ischemic burden. No catheterization due to renal failure. Continue medical therapy. No ASA given Eliquis use.  He is on statin and Toprol XL.  4. CKD: Stage IV. S/p AVF in RUE.  Will check BMET today. Followed by Dr [name] in [place].  5.  DM2: followed by Dr. Sherwood [name] 7. H/O rectal cancer 2008: Has diverting colostomy. CT of abdomen concerned for perirectal recurrence. He has follow up with Dr [name]. 8. Hyperlipidemia: Continue atorvastatin 80 mg daily  Arvilla Meres MD 10/14/2015

## 2015-10-21 ENCOUNTER — Other Ambulatory Visit: Payer: Self-pay | Admitting: *Deleted

## 2015-10-21 ENCOUNTER — Encounter: Payer: Self-pay | Admitting: *Deleted

## 2015-10-21 ENCOUNTER — Encounter: Payer: Self-pay | Admitting: Internal Medicine

## 2015-10-21 NOTE — Patient Outreach (Signed)
No return call from messages or letter sent to patient. Will close case per protocol. [name]. [name], RN, BSN, [place] [phone]

## 2015-10-23 ENCOUNTER — Encounter: Payer: Medicare Other | Admitting: Vascular Surgery

## 2015-11-11 IMAGING — CR DG CHEST 1V PORT
1 series · 1 of 1 positions shown · non-contrast
Comparison: Chest x-ray from abdominal series of May 19, 2014

CLINICAL DATA: Irregular heart rate.

EXAM:
PORTABLE CHEST - 1 VIEW

[portable]
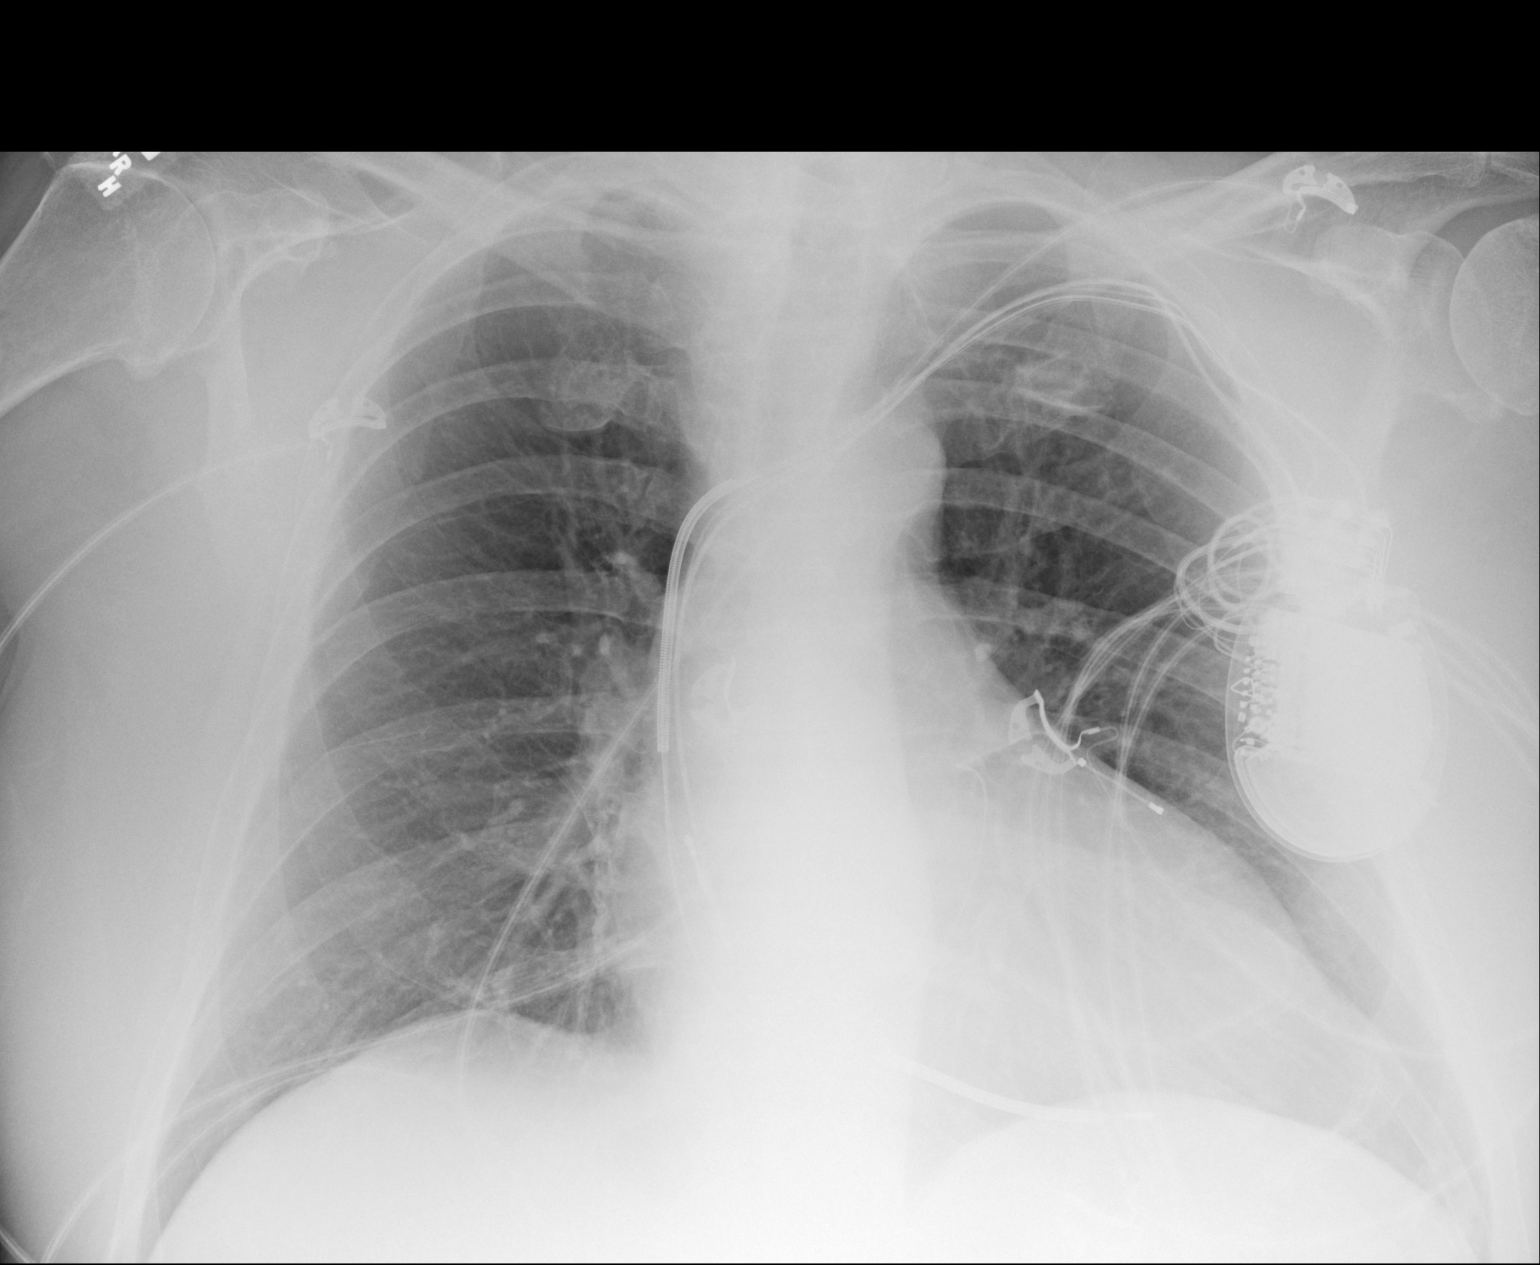

[1 of 1 positions shown; findings below may reference images not displayed]

FINDINGS: The lungs are well-expanded and clear. There is a permanent
pacemaker defibrillator in place. The cardiac silhouette is
top-normal in size. The pulmonary vascularity is normal. There is no
pleural effusion. The bony thorax is unremarkable.
IMPRESSION: There is no evidence of CHF nor other acute cardiopulmonary
abnormality.

## 2015-11-14 ENCOUNTER — Encounter: Payer: Self-pay | Admitting: Vascular Surgery

## 2015-11-18 ENCOUNTER — Ambulatory Visit (HOSPITAL_COMMUNITY)
Admission: RE | Admit: 2015-11-18 | Discharge: 2015-11-18 | Disposition: A | Discharge status: Home / Self Care | Payer: Medicare Other | Source: Ambulatory Visit | Attending: Internal Medicine | Admitting: Internal Medicine

## 2015-11-18 ENCOUNTER — Encounter (HOSPITAL_COMMUNITY): Payer: Self-pay | Admitting: Internal Medicine

## 2015-11-18 VITALS — BP 90/44 | HR 73 | Wt 196.5 lb

## 2015-11-18 DIAGNOSIS — I482 Chronic atrial fibrillation, unspecified: Secondary | ICD-10-CM

## 2015-11-18 DIAGNOSIS — I5022 Chronic systolic (congestive) heart failure: Secondary | ICD-10-CM | POA: Insufficient documentation

## 2015-11-18 LAB — BASIC METABOLIC PANEL
Anion gap: 11 (ref 5–15)
BUN: 27 mg/dL — AB (ref 6–20)
CO2: 27 mmol/L (ref 22–32)
CREATININE: 2.33 mg/dL — AB (ref 0.61–1.24)
Calcium: 9.4 mg/dL (ref 8.9–10.3)
Chloride: 98 mmol/L — ABNORMAL LOW (ref 101–111)
GFR calc Af Amer: 33 mL/min — ABNORMAL LOW (ref >60)
GFR, EST NON AFRICAN AMERICAN: 29 mL/min — AB (ref >60)
GLUCOSE: 159 mg/dL — AB (ref 65–99)
Potassium: 3.3 mmol/L — ABNORMAL LOW (ref 3.5–5.1)
SODIUM: 136 mmol/L (ref 135–145)

## 2015-11-18 NOTE — Progress Notes (Signed)
Patient ID: [name], male   DOB: 07-27-1956, 59 y.o.   MRN: [id number]  ADVANCED HF CLINIC NOTE  PCP: Dr. [name] Oncologist: Dr Truett Perna.  CHF: Kash Davie  59 yo with history of CAD and ischemic cardiomyopathy EF 20-25%, BiV ICD upgrade 2009, chronic atrial fibrillation s/p AVN ablation 9/16, CKD, and traumatic SAH in 1/16 after a fall.  He has been followed at the heart failure clinic at [facility] by Dr Devonne Doughty in the past.    He was admitted in 3/16 from the [facility] ER with exertional dyspnea/volume overload and had a cardiac arrest/ventricular fibrillation while in the hospital terminated by his ICD.  After diuresis, RHC showed relatively normal filling pressures and preserved cardiac index.  Creatinine peaked at 3.0. (was 5.0 in 1/16).  Echo in 3/16 showed EF 25-30% with restrictive diastolic function.  Cardiolite was done, showing areas of scar but no ischemia.  EF 18%.    Admitted 4/16 with hematuria and elevated LFTs. Questionable amiodarone toxicity and this was stopped.   In 5/16 Underwent elective BiV ICD generator change which went without complication. He was admitted again 04/23/15 with acute on chronic CHF and respiratory failure. During that admission he ruled in for a NSTEMI- Troponin 4.29. He has chronic stage 4 CKD and it was decided to obtain a Myoview before considering a cath. Myoview 04/26/15 showed EF 20% with large region of prior infarct involving the apical anterior, anteroseptal, inferoseptal and inferior walls with extension to the true apex. Small region of reversible/inducible ischemia along the margin of the prior infarct at the mid ventricular anterior and anteroseptal walls. No change from previous.   Admitted to [facility] in 8/16 for increased dyspnea and volume overload. Diuresed with IV lasix and transitioned to 40 mg daily. D/C weight  200 pounds.  Redmitted in 9/16 with bleeding from stoma site and atrial fibrillation with RVR.  Eliquis was held and bleeding  stopped.  Eliquis was restarted prior to discharge. He was noted to have < 50% BiV pacing and also to be very volume overloaded. He was then diuresed and had AV nodal ablation.    Readmitted 10/16 with volume overload and worsening renal failure.. Diuresed 32 pounds. Went home 189 pounds. Switched to torsemide on d/c 80/40  He reports today for regular follow up. Weight is up 6 lbs from last  But says it is from improved appetite.  Energy level great. Feels like his "old self, 10 years ago". No DOE. Can do anything he wants without difficulty. Had a full deer hunting excursion with gear > 10 lbs on his back and ladder climbing with no difficulty. No CP, no lightheadedness, dizziness, or near-syncope. Weight at home 189.6.  Sleeps in recliner, although he does lay back pretty flat. He says his appetite has returned and come back very strong over the past few weeks.  BP low in clinic, but has not eaten yet today despite taking meds, and had very little to drink.   Corevue: No interrogation today with machine (clinic) malfunction.    Labs (12/15): LDL 123 Labs (3/16): K 3.8, creatinine 2.78 Labs (03/10/2015): K 4.5 Creatinine 2.49  TSH 4.58, LFTs normal, digoxin 0.8 Labs (04/26/15): K 3.5 creatinine 2.13 Labs (08/04/2015): K 3.3 Creatinine 3.01  Labs (9/16): K 4.1, creatinine 2.7, HCT 30.8  PMH: 1. HTN 2. Type II diabetes 3. CAD: s/p BMS LAD in 2001, PTCA ramus and BMS LAD in 2009.  Cardiolite (3/16) with EF 18%, no ischemia, prior  anterior, apical and inferior infarction. NSTEMI in 5/16.  Myoview done 04/26/15 showed EF 20% with large region of prior infarct involving the apical anterior, anteroseptal, inferoseptal and inferior walls with extension to the true apex. Small region of reversible/inducible ischemia along the margin of the prior infarct at the mid ventricular anterior and anteroseptal walls. No change from previous.  4. Chronic systolic CHF: Ischemic CMP.  St Jude CRT-D.  Echo (3/16) with EF  25-30%, restrictive diastolic function, mild LVH, mild MR.  RHC (3/16) with mean RA 9, PA 47/29, mean PCWP 16, CI 2.5 (Fick).  Suspect ACEI cough. Echo (5/16) with EF 35%.   5. SAH: 1/16 after fall (traumatic).   6. GERD 7. Atrial fibrillation: Paroxysmal initially but became persistent.  Now s/p AV nodal ablation to allow BiV pacing. 8. Rectal cancer: s/p surgery. Has colostomy.  9. Prostate cancer: s/p chemo/radiation.  10. H/o TIA 11. OSA: On CPAP.  12. Hyperlipidemia 13. CKD stage IV: Followed by Dr Kristian Covey.  14. Bleeding from stoma.   SH: Married, lives in [place], nonsmoker.   FH: CAD  ROS: All systems reviewed and negative except as per HPI.   Current Outpatient Prescriptions  Medication Sig Dispense Refill  . acetaminophen-codeine (TYLENOL #3) 300-30 MG tablet Take 1 tablet by mouth every 4 (four) hours as needed.  0  . apixaban (ELIQUIS) 5 MG TABS tablet Take 1 tablet (5 mg total) by mouth 2 (two) times daily. 60 tablet 3  . atorvastatin (LIPITOR) 80 MG tablet Take 1 tablet (80 mg total) by mouth daily. 30 tablet 6  . co-enzyme Q-10 50 MG capsule Take 50 mg by mouth every morning.     . ferrous sulfate 325 (65 FE) MG tablet Take 1 tablet (325 mg total) by mouth 2 (two) times daily with a meal. 60 tablet 0  . fluticasone (FLONASE) 50 MCG/ACT nasal spray Place 1 spray into both nostrils daily as needed for allergies.     Marland Kitchen glimepiride (AMARYL) 1 MG tablet Take 1 mg by mouth daily with breakfast.    . Insulin Glargine (TOUJEO SOLOSTAR) 300 UNIT/ML SOPN Inject 20 Units into the skin every morning.    . metoprolol succinate (TOPROL-XL) 25 MG 24 hr tablet Take 0.5 tablets (12.5 mg total) by mouth 2 (two) times daily. 45 tablet 3  . Omega-3 Fatty Acids (FISH OIL) 1000 MG CAPS Take 1,000 mg by mouth 2 (two) times daily.    . pantoprazole (PROTONIX) 40 MG tablet Take 1 tablet (40 mg total) by mouth daily. 30 tablet 0  . PROAIR RESPICLICK 108 (90 BASE) MCG/ACT AEPB Inhale 2 puffs  into the lungs every 6 (six) hours as needed (for breathing).   0  . torsemide (DEMADEX) 20 MG tablet Take 80mg  in the AM and 40mg  in the PM 180 tablet 6  . torsemide (DEMADEX) 20 MG tablet Take by mouth. Take 80mg  in the AM and 40mg  in the PM     No current facility-administered medications for this encounter.    Filed Vitals:   11/18/15 1151  BP: 90/44  Pulse: 73  Weight: 196 lb 8 oz (89.132 kg)  SpO2: 98%    Wt Readings from Last 3 Encounters:  11/18/15 196 lb 8 oz (89.132 kg)  10/14/15 190 lb 4 oz (86.297 kg)  10/09/15 189 lb (85.73 kg)    General: NAD Ambulated in the clinic without difficutly.  Neck: JVP 6 cm.   no thyromegaly or thyroid nodule.  Lungs: Clear,  normal effort. CV: Nondisplaced PMI.  Regular S1S2, 2/6 SEM RUSB.  no edema.  No carotid bruit.  Normal pedal pulses.  Abdomen: Soft, NT, ND, no HSM appreciated. S/p colostomy.  Skin: Intact without lesions or rashes.  Neurologic: Alert and oriented x 3.  Psych: Normal affect. Extremities: No clubbing or cyanosis.  HEENT: Normal.   Assessment/Plan: 1. Chronic systolic CHF: Ischemic cardiomyopathy.  Has been followed for a long time at [facility].  Deemed not transplant or VAD candidate with comorbities and advanced CKD. Has St Jude CRT-D, now s/p AV nodal ablation to promote BiV pacing.  Breathing much better since AV nodal ablation (NYHA class II) - Continue torsemide.   - Continue Toprol 25 bid.  - No ACEI, spironolactone, digoxin with advanced CKD.  - Has not been checking pressure at home. Low in clinic today.  Will keep off hydral/NTG for now.   - Given CKD and previous cancer not good VAD or transplant candidate (would need heart/kidney)  2. Atrial fibrillation: Chronic. CHADS2Vasc Score 5. s/p AV nodal ablation - Feels great s/p AV node ablation.  - Did have one episode of stoma bleeding back on Eliquis 5 bid. But none since.  3. CAD:  NSTEMI in 5/16.  - Cardiolite without high ischemic burden. Poor cath  candidate with renal failure. - No ASA given Eliquis use.   - Cont statin and Toprol XL.  4. CKD: Stage IV. S/p AVF in RUE.  - BMET today.  - Followed by Dr Kristian Covey in [place].  5.  DM2: followed by Dr. [name] 7. H/O rectal cancer 2008: Has diverting colostomy. CT of abdomen concerned for perirectal recurrence. He has follow up with Dr Truett Perna. 8. Hyperlipidemia: - Continue statin   BMET today. Follow up in 6 weeks.  Graciella Freer PA-C 11/18/2015   Patient seen and examined with Otilio Saber, PA-C. We discussed all aspects of the encounter. I agree with the assessment and plan as stated above.   He is doing great after AVN ablation (to promote BiV pacing) and with switch of lasix to torsemide. Now NYHA II. Volume status ok. Continue current regimen.   Cristian Grieves,MD 6:46 PM

## 2015-11-18 NOTE — Patient Instructions (Signed)
Routine lab work today. Will notify you of abnormal results  Follow up: 6 weeks with Dr.Benismhon  Do the following things EVERYDAY: 1) Weigh yourself in the morning before breakfast. Write it down and keep it in a log. 2) Take your medicines as prescribed 3) Eat low salt foods-Limit salt (sodium) to 2000 mg per day.  4) Stay as active as you can everyday 5) Limit all fluids for the day to less than 2 liters

## 2015-11-20 ENCOUNTER — Encounter: Payer: Self-pay | Admitting: Vascular Surgery

## 2015-11-20 ENCOUNTER — Ambulatory Visit (HOSPITAL_COMMUNITY)
Admission: RE | Admit: 2015-11-20 | Discharge: 2015-11-20 | Disposition: A | Discharge status: Home / Self Care | Payer: Medicare Other | Source: Ambulatory Visit | Attending: Vascular Surgery | Admitting: Vascular Surgery

## 2015-11-20 ENCOUNTER — Ambulatory Visit (INDEPENDENT_AMBULATORY_CARE_PROVIDER_SITE_OTHER): Payer: Medicare Other | Admitting: Vascular Surgery

## 2015-11-20 VITALS — BP 106/64 | HR 70 | Temp 97.4°F | Resp 16 | Ht 69.0 in | Wt 200.0 lb

## 2015-11-20 DIAGNOSIS — I129 Hypertensive chronic kidney disease with stage 1 through stage 4 chronic kidney disease, or unspecified chronic kidney disease: Secondary | ICD-10-CM | POA: Insufficient documentation

## 2015-11-20 DIAGNOSIS — N184 Chronic kidney disease, stage 4 (severe): Secondary | ICD-10-CM | POA: Diagnosis not present

## 2015-11-20 DIAGNOSIS — E119 Type 2 diabetes mellitus without complications: Secondary | ICD-10-CM | POA: Diagnosis not present

## 2015-11-20 DIAGNOSIS — Z48812 Encounter for surgical aftercare following surgery on the circulatory system: Secondary | ICD-10-CM | POA: Insufficient documentation

## 2015-11-20 DIAGNOSIS — N189 Chronic kidney disease, unspecified: Secondary | ICD-10-CM | POA: Insufficient documentation

## 2015-11-20 DIAGNOSIS — E785 Hyperlipidemia, unspecified: Secondary | ICD-10-CM | POA: Diagnosis not present

## 2015-11-20 NOTE — Progress Notes (Signed)
Patient is a 59 year old male who is status post placement of a right brachiocephalic AV fistula [id number]. He is currently not on hemodialysis. He denies any numbness or tingling in his hand.  Physical exam:  Filed Vitals:   11/20/15 1143  BP: 106/64  Pulse: 70  Temp: 97.4 F (36.3 C)  TempSrc: Oral  Resp: 16  Height: 5\' 9"  (1.753 m)  Weight: 200 lb (90.719 kg)   Right upper extremity: Well-healed antecubital incision. Palpable thrill in fistula. Audible bruit fistula is palpable throughout the upper arm.  Data: Duplex ultrasound AV fistula shows diameter 8-11 mm, depth less than 3 mm  Assessment: Maturing right arm AV fistula  Plan: Follow-up as needed  Ruta Hinds, MD [facility] Office: [phone] Pager: [phone]

## 2015-11-21 DIAGNOSIS — E559 Vitamin D deficiency, unspecified: Secondary | ICD-10-CM | POA: Diagnosis not present

## 2015-11-21 DIAGNOSIS — N184 Chronic kidney disease, stage 4 (severe): Secondary | ICD-10-CM | POA: Diagnosis not present

## 2015-11-21 DIAGNOSIS — I509 Heart failure, unspecified: Secondary | ICD-10-CM | POA: Diagnosis not present

## 2015-11-21 DIAGNOSIS — D509 Iron deficiency anemia, unspecified: Secondary | ICD-10-CM | POA: Diagnosis not present

## 2015-11-21 DIAGNOSIS — R809 Proteinuria, unspecified: Secondary | ICD-10-CM | POA: Diagnosis not present

## 2015-11-21 DIAGNOSIS — D519 Vitamin B12 deficiency anemia, unspecified: Secondary | ICD-10-CM | POA: Diagnosis not present

## 2015-11-21 DIAGNOSIS — Z79899 Other long term (current) drug therapy: Secondary | ICD-10-CM | POA: Diagnosis not present

## 2015-11-25 ENCOUNTER — Encounter: Payer: Medicare Other | Admitting: Cardiology

## 2015-11-25 NOTE — Progress Notes (Signed)
    ERROR Cancelled Appt 

## 2015-11-26 IMAGING — CT CT HEAD W/O CM
1 series · 16 of 30 positions shown, 20 images · non-contrast
Comparison: CT scan of the brain February 28, 2014

CLINICAL DATA: Dizziness and fall

EXAM:
CT HEAD WITHOUT CONTRAST
TECHNIQUE: Contiguous axial images were obtained from the base of the skull
through the vertex without intravenous contrast.

[Series 2: head 5.0 h30s · axial · 0.44mm/px · z∈[-152,-12]mm · 16 of 32 slices shown, 20 images]
[im 2/32  brain]
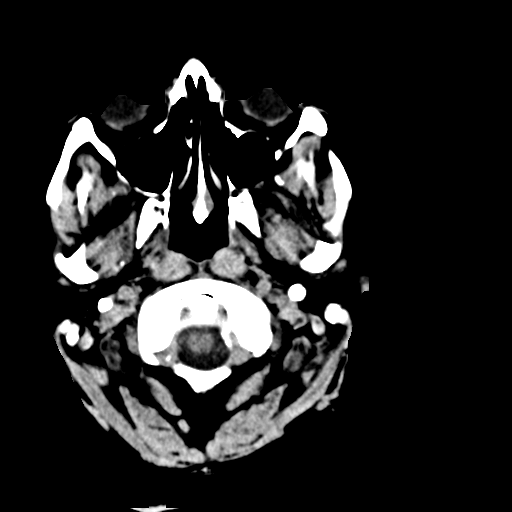
[im 2/32  bone]
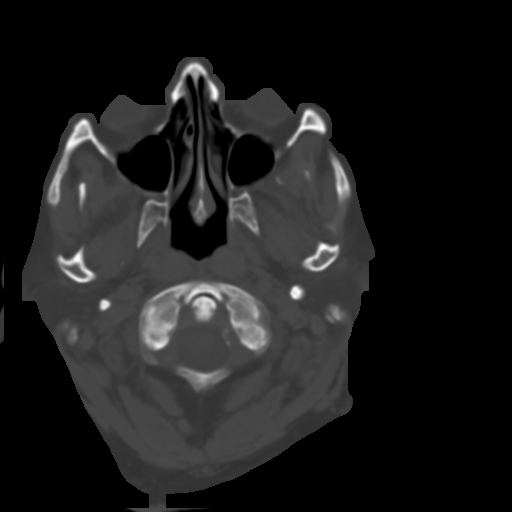
[im 4/32  brain]
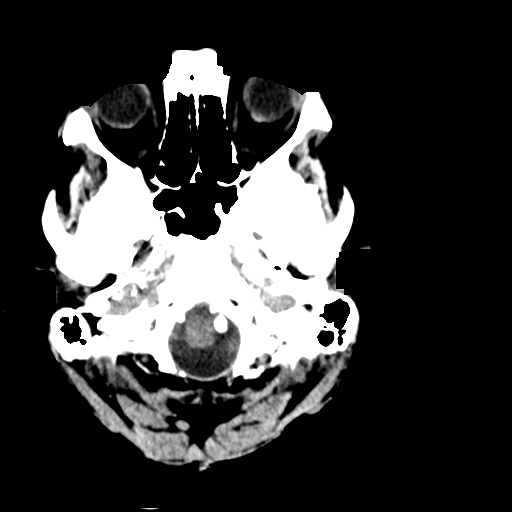
[im 6/32  brain]
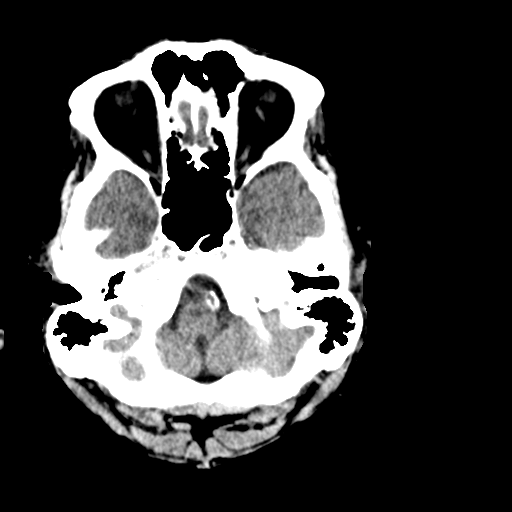
[im 8/32  brain]
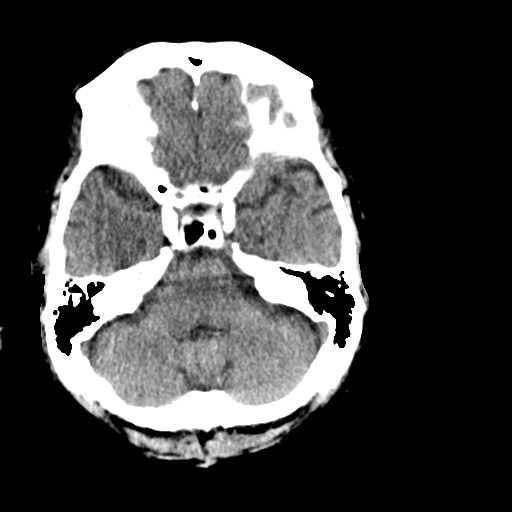
[im 9/32  brain]
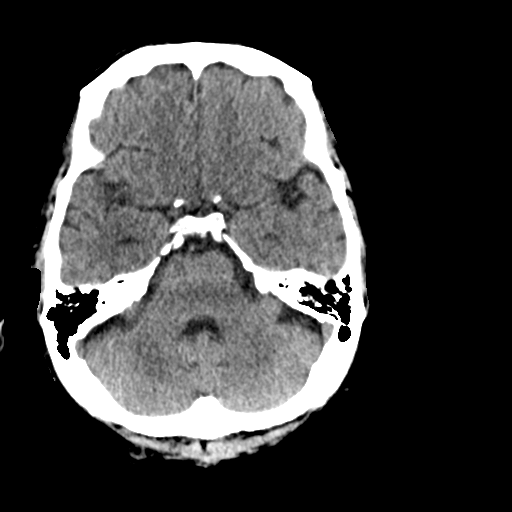
[im 9/32  bone]
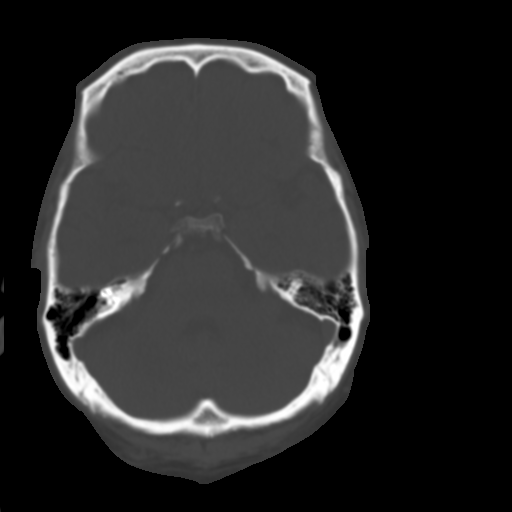
[im 11/32  brain]
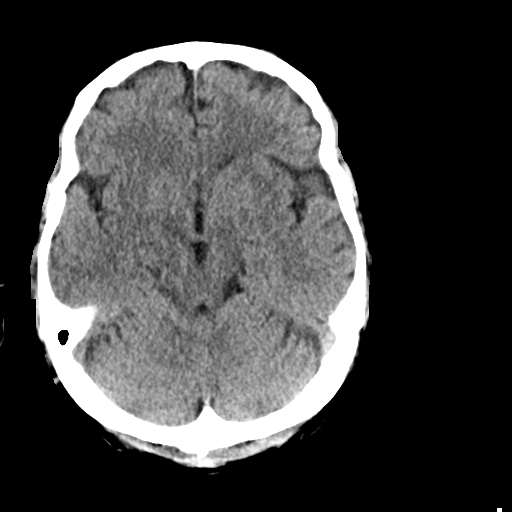
[im 13/32  brain]
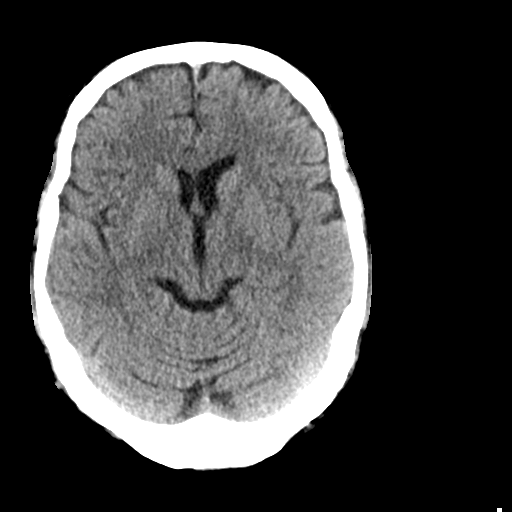
[im 15/32  brain]
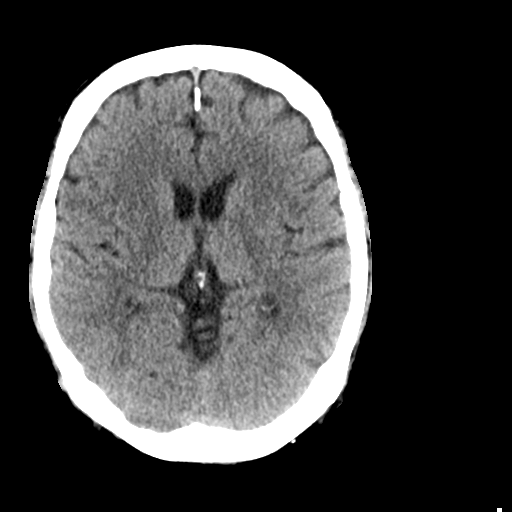
[im 17/32  brain]
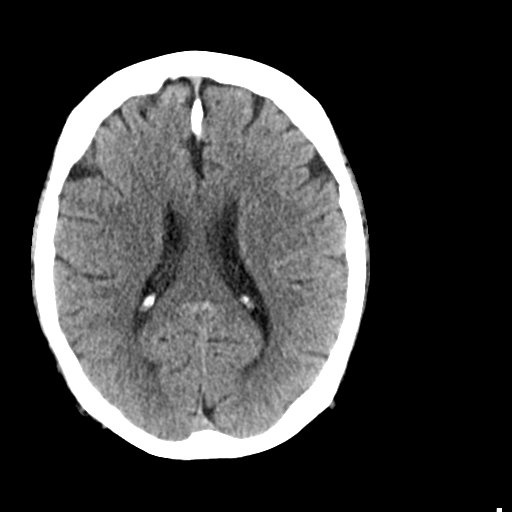
[im 17/32  bone]
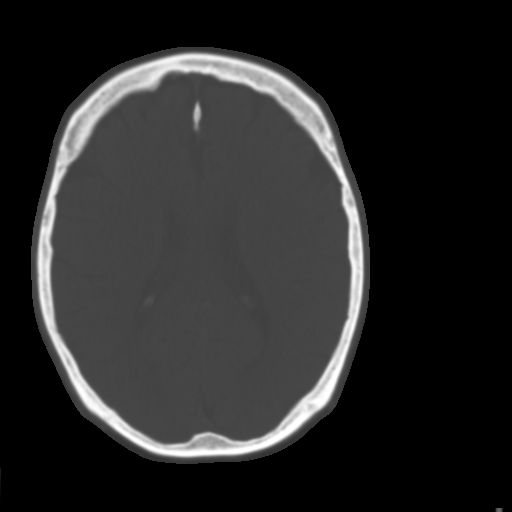
[im 19/32  brain]
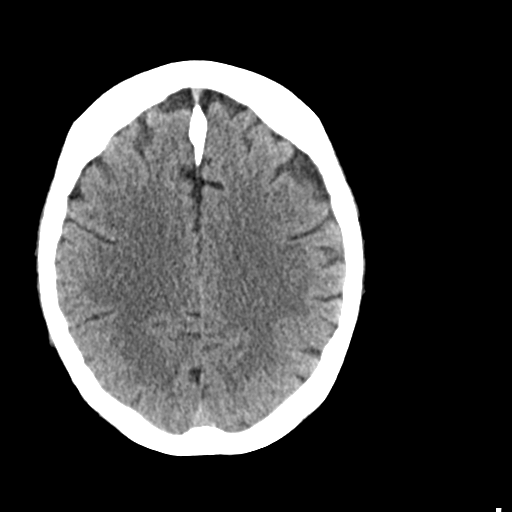
[im 21/32  brain]
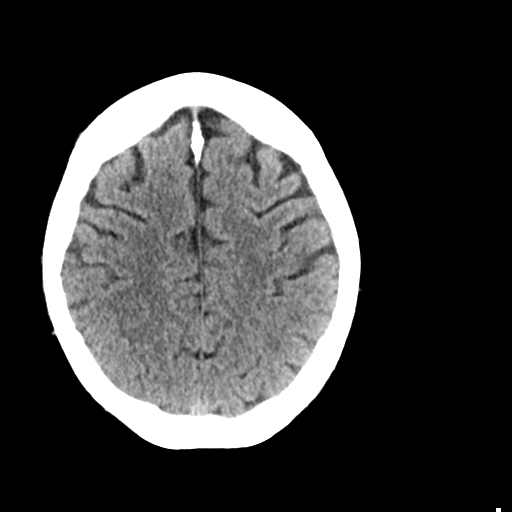
[im 23/32  brain]
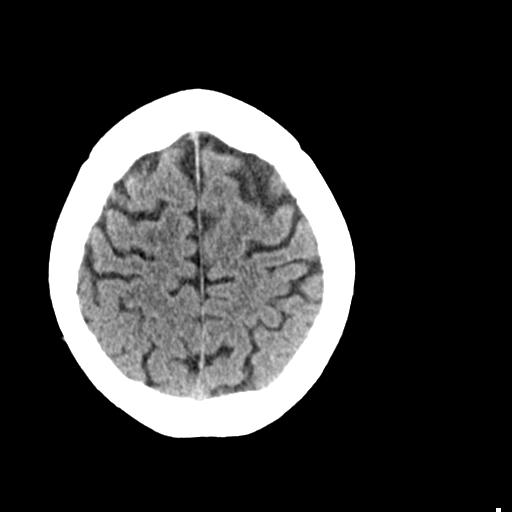
[im 24/32  brain]
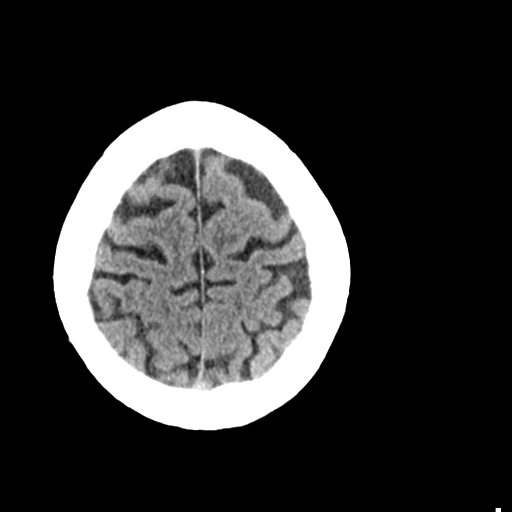
[im 24/32  bone]
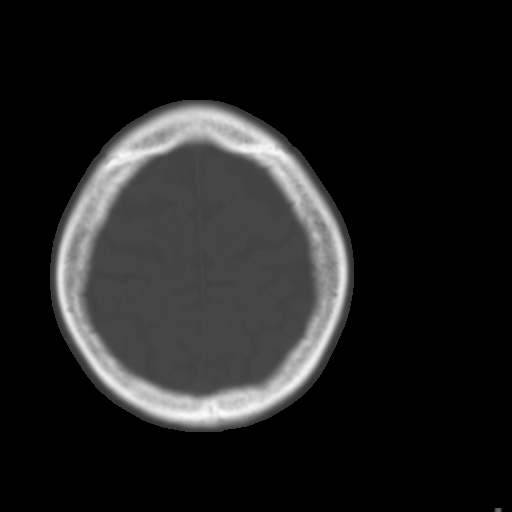
[im 26/32  brain]
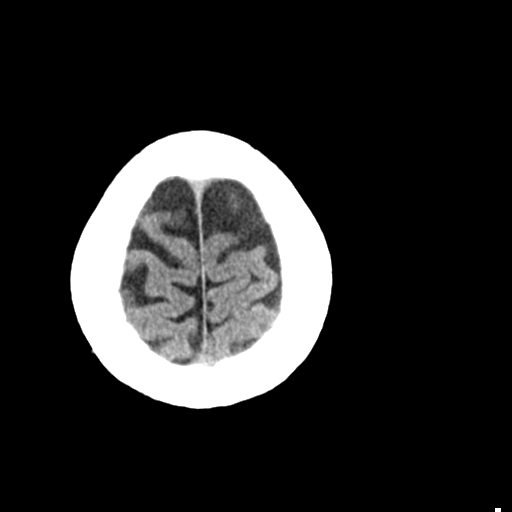
[im 28/32  brain]
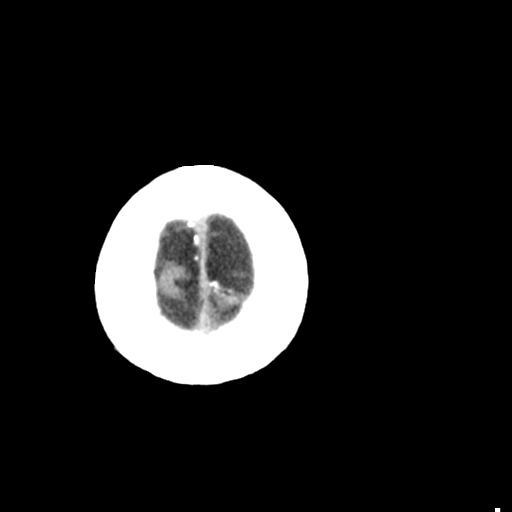
[im 30/32  brain]
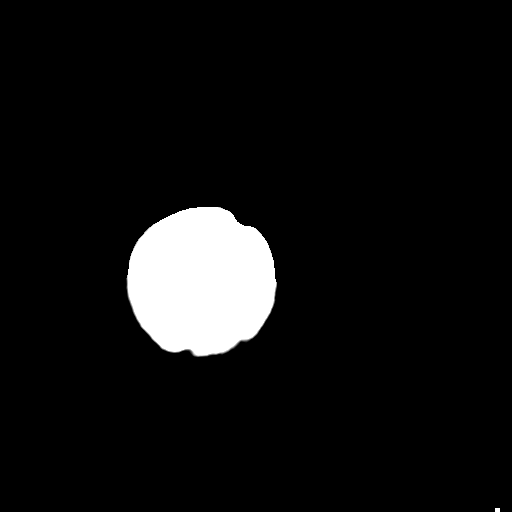

[16 of 30 positions shown; findings below may reference images not displayed]

FINDINGS: The ventricles are normal in size and position. There is no
intracranial hemorrhage nor intracranial mass effect. There is no
acute ischemic change. The cerebellum and brainstem are normal.

The observed paranasal sinuses and mastoid air cells are clear.
There is no acute skull fracture.
IMPRESSION: There is no acute intracranial hemorrhage nor other acute
intracranial abnormality.

## 2015-11-26 IMAGING — CR DG CHEST 1V PORT
1 series · 1 of 1 positions shown · non-contrast
Comparison: 07/02/2014 and 05/19/2014 radiographs.

CLINICAL DATA: Dizziness. History of myocardial infarction,
hypertension and diabetes.

EXAM:
PORTABLE CHEST - 1 VIEW

[AP]
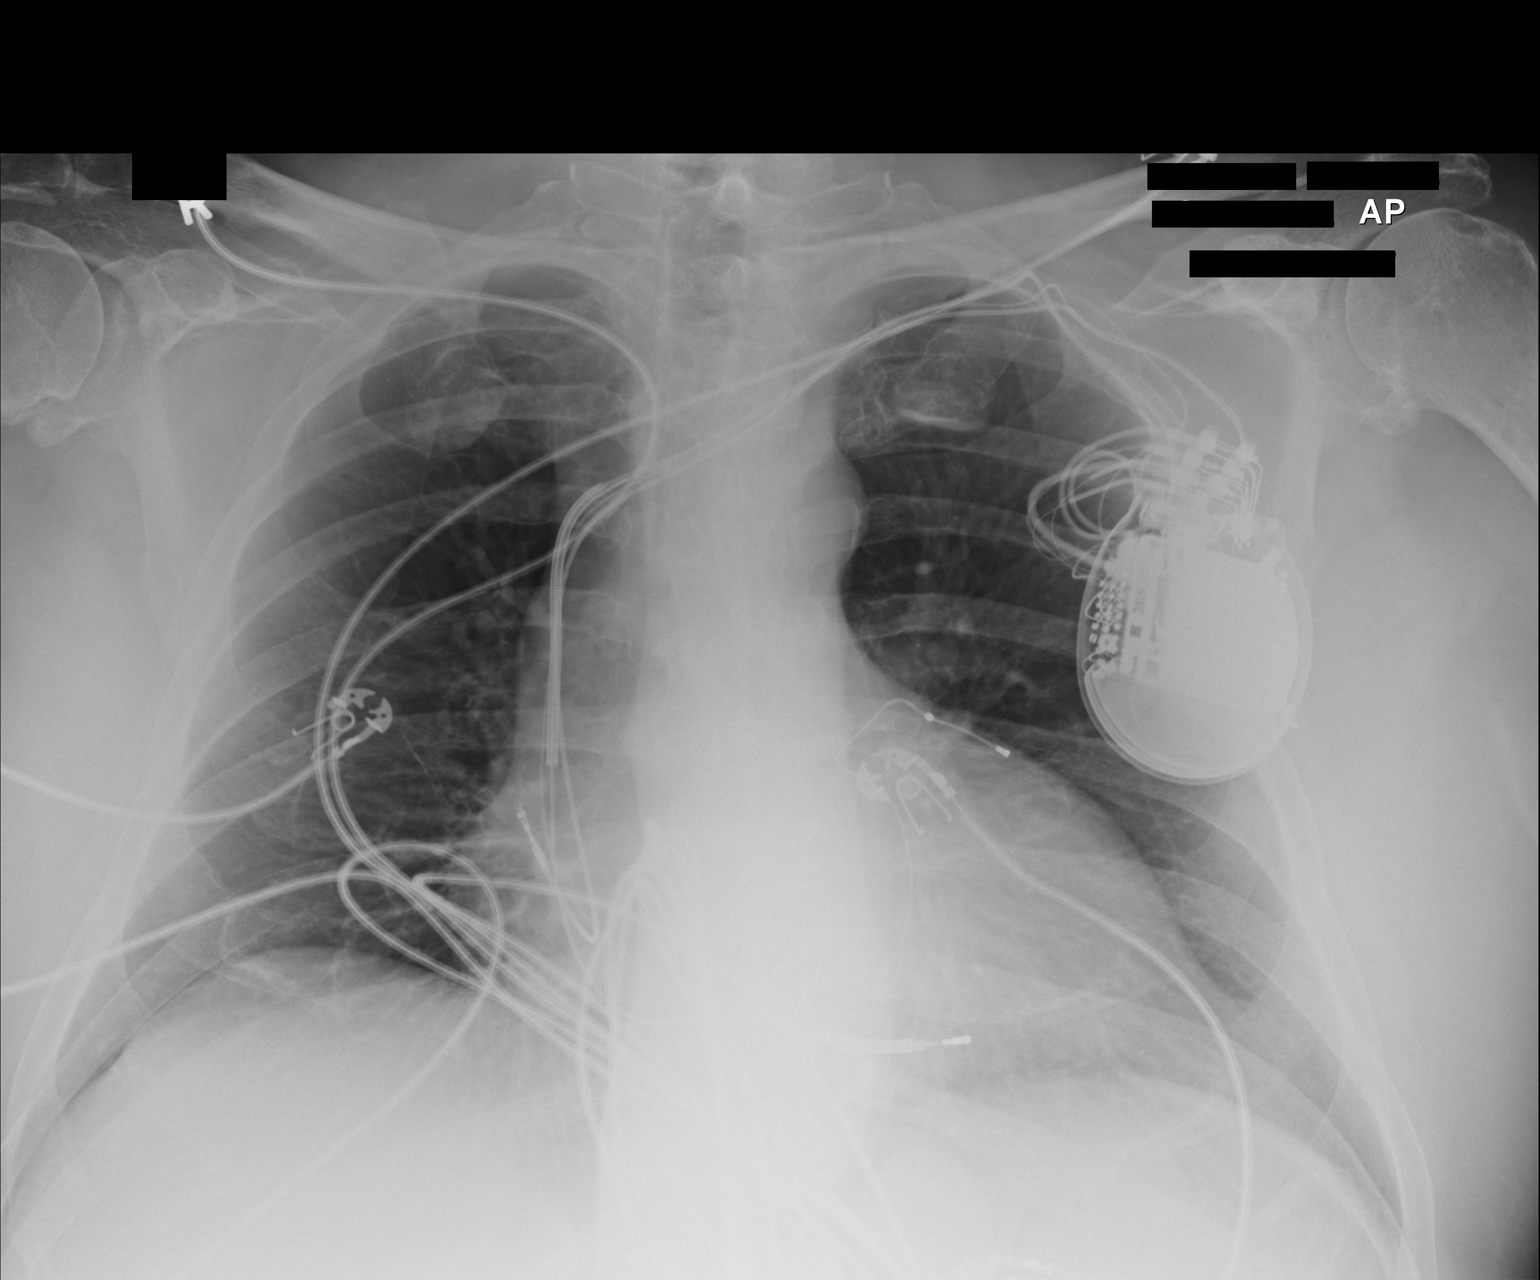

[1 of 1 positions shown; findings below may reference images not displayed]

FINDINGS: 9999 hr. The left subclavian pacemaker/AICD leads appear unchanged.
The heart size and mediastinal contours are stable. The lungs are
clear. There is no pleural effusion or pneumothorax. No acute
osseous findings are evident.
IMPRESSION: Stable chest.  No acute cardiopulmonary process.

## 2015-11-27 ENCOUNTER — Telehealth: Payer: Self-pay | Admitting: Nutrition

## 2015-11-27 NOTE — Telephone Encounter (Signed)
vm to call back to reschedule appt.

## 2015-12-02 ENCOUNTER — Other Ambulatory Visit: Payer: Medicare Other

## 2015-12-02 ENCOUNTER — Ambulatory Visit (HOSPITAL_COMMUNITY): Payer: Medicare Other

## 2015-12-03 ENCOUNTER — Telehealth (HOSPITAL_COMMUNITY): Payer: Self-pay | Admitting: Cardiology

## 2015-12-03 ENCOUNTER — Telehealth: Payer: Self-pay | Admitting: Oncology

## 2015-12-03 ENCOUNTER — Encounter (HOSPITAL_COMMUNITY): Payer: Self-pay | Admitting: Cardiology

## 2015-12-03 DIAGNOSIS — I5022 Chronic systolic (congestive) heart failure: Secondary | ICD-10-CM

## 2015-12-03 MED ORDER — POTASSIUM CHLORIDE CRYS ER 20 MEQ PO TBCR: EXTENDED_RELEASE_TABLET | ORAL | Status: DC

## 2015-12-03 NOTE — Telephone Encounter (Signed)
-----   Message from Jolaine Artist, MD sent at 11/18/2015  4:00 PM EST ----- Ok  Add 20 kcl per day. Take 40 the first day. Repeat 2 weeks.
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
## 2015-12-04 ENCOUNTER — Ambulatory Visit: Payer: Medicare Other | Admitting: Oncology

## 2015-12-16 ENCOUNTER — Ambulatory Visit (INDEPENDENT_AMBULATORY_CARE_PROVIDER_SITE_OTHER): Payer: Medicare Other | Admitting: *Deleted

## 2015-12-16 ENCOUNTER — Telehealth: Payer: Self-pay | Admitting: Cardiology

## 2015-12-16 DIAGNOSIS — I5022 Chronic systolic (congestive) heart failure: Secondary | ICD-10-CM | POA: Diagnosis not present

## 2015-12-16 DIAGNOSIS — I255 Ischemic cardiomyopathy: Secondary | ICD-10-CM

## 2015-12-16 NOTE — Telephone Encounter (Signed)
LMOVM reminding pt to send remote transmission.   

## 2015-12-17 ENCOUNTER — Encounter: Payer: Self-pay | Admitting: Cardiology

## 2015-12-30 ENCOUNTER — Encounter (HOSPITAL_COMMUNITY): Payer: Self-pay | Admitting: Internal Medicine

## 2015-12-30 ENCOUNTER — Ambulatory Visit (HOSPITAL_COMMUNITY)
Admission: RE | Admit: 2015-12-30 | Discharge: 2015-12-30 | Disposition: A | Discharge status: Home / Self Care | Payer: Medicare Other | Source: Ambulatory Visit | Attending: Internal Medicine | Admitting: Internal Medicine

## 2015-12-30 VITALS — BP 104/62 | HR 73 | Wt 199.0 lb

## 2015-12-30 DIAGNOSIS — E1122 Type 2 diabetes mellitus with diabetic chronic kidney disease: Secondary | ICD-10-CM | POA: Insufficient documentation

## 2015-12-30 DIAGNOSIS — Z794 Long term (current) use of insulin: Secondary | ICD-10-CM | POA: Insufficient documentation

## 2015-12-30 DIAGNOSIS — Z9221 Personal history of antineoplastic chemotherapy: Secondary | ICD-10-CM | POA: Diagnosis not present

## 2015-12-30 DIAGNOSIS — I130 Hypertensive heart and chronic kidney disease with heart failure and stage 1 through stage 4 chronic kidney disease, or unspecified chronic kidney disease: Secondary | ICD-10-CM | POA: Insufficient documentation

## 2015-12-30 DIAGNOSIS — G4733 Obstructive sleep apnea (adult) (pediatric): Secondary | ICD-10-CM | POA: Diagnosis not present

## 2015-12-30 DIAGNOSIS — N184 Chronic kidney disease, stage 4 (severe): Secondary | ICD-10-CM | POA: Diagnosis not present

## 2015-12-30 DIAGNOSIS — I482 Chronic atrial fibrillation, unspecified: Secondary | ICD-10-CM

## 2015-12-30 DIAGNOSIS — Z95810 Presence of automatic (implantable) cardiac defibrillator: Secondary | ICD-10-CM | POA: Insufficient documentation

## 2015-12-30 DIAGNOSIS — Z85048 Personal history of other malignant neoplasm of rectum, rectosigmoid junction, and anus: Secondary | ICD-10-CM | POA: Insufficient documentation

## 2015-12-30 DIAGNOSIS — Z8546 Personal history of malignant neoplasm of prostate: Secondary | ICD-10-CM | POA: Insufficient documentation

## 2015-12-30 DIAGNOSIS — E785 Hyperlipidemia, unspecified: Secondary | ICD-10-CM | POA: Diagnosis not present

## 2015-12-30 DIAGNOSIS — Z923 Personal history of irradiation: Secondary | ICD-10-CM | POA: Diagnosis not present

## 2015-12-30 DIAGNOSIS — I2510 Atherosclerotic heart disease of native coronary artery without angina pectoris: Secondary | ICD-10-CM | POA: Insufficient documentation

## 2015-12-30 DIAGNOSIS — Z8673 Personal history of transient ischemic attack (TIA), and cerebral infarction without residual deficits: Secondary | ICD-10-CM | POA: Diagnosis not present

## 2015-12-30 DIAGNOSIS — K219 Gastro-esophageal reflux disease without esophagitis: Secondary | ICD-10-CM | POA: Diagnosis not present

## 2015-12-30 DIAGNOSIS — I252 Old myocardial infarction: Secondary | ICD-10-CM | POA: Insufficient documentation

## 2015-12-30 DIAGNOSIS — Z933 Colostomy status: Secondary | ICD-10-CM | POA: Diagnosis not present

## 2015-12-30 DIAGNOSIS — I255 Ischemic cardiomyopathy: Secondary | ICD-10-CM | POA: Diagnosis not present

## 2015-12-30 DIAGNOSIS — Z7902 Long term (current) use of antithrombotics/antiplatelets: Secondary | ICD-10-CM | POA: Insufficient documentation

## 2015-12-30 DIAGNOSIS — I5022 Chronic systolic (congestive) heart failure: Secondary | ICD-10-CM | POA: Insufficient documentation

## 2015-12-30 DIAGNOSIS — Z8249 Family history of ischemic heart disease and other diseases of the circulatory system: Secondary | ICD-10-CM | POA: Insufficient documentation

## 2015-12-30 DIAGNOSIS — Z79899 Other long term (current) drug therapy: Secondary | ICD-10-CM | POA: Diagnosis not present

## 2015-12-30 LAB — BASIC METABOLIC PANEL
ANION GAP: 14 (ref 5–15)
BUN: 47 mg/dL — ABNORMAL HIGH (ref 6–20)
CHLORIDE: 99 mmol/L — AB (ref 101–111)
CO2: 25 mmol/L (ref 22–32)
Calcium: 9.7 mg/dL (ref 8.9–10.3)
Creatinine, Ser: 2.72 mg/dL — ABNORMAL HIGH (ref 0.61–1.24)
GFR calc Af Amer: 27 mL/min — ABNORMAL LOW (ref >60)
GFR calc non Af Amer: 24 mL/min — ABNORMAL LOW (ref >60)
GLUCOSE: 152 mg/dL — AB (ref 65–99)
POTASSIUM: 3.9 mmol/L (ref 3.5–5.1)
Sodium: 138 mmol/L (ref 135–145)

## 2015-12-30 NOTE — Addendum Note (Signed)
Encounter addended by: Scarlette Calico, RN on: 12/30/2015 10:06 AM<BR>     Documentation filed: Dx Association, Patient Instructions Section, Orders
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
## 2015-12-30 NOTE — Addendum Note (Signed)
Encounter addended by: Jolaine Artist, MD on: 12/30/2015  9:54 AM<BR>     Documentation filed: Charges VN

## 2015-12-30 NOTE — Patient Instructions (Signed)
Lab today  We will contact you in 4 months to schedule your next appointment.  

## 2015-12-31 NOTE — Progress Notes (Signed)
Remote ICD transmission.   

## 2016-01-05 LAB — CUP PACEART REMOTE DEVICE CHECK
Battery Remaining Percentage: 87 %
Brady Statistic RV Percent Paced: 99 %
HIGH POWER IMPEDANCE MEASURED VALUE: 40 Ohm
Implantable Lead Implant Date: 20091026
Implantable Lead Location: 753859
Implantable Lead Serial Number: 125581
Lead Channel Impedance Value: 380 Ohm
Lead Channel Setting Pacing Amplitude: 2 V
Lead Channel Setting Pacing Pulse Width: 0.5 ms
Lead Channel Setting Pacing Pulse Width: 0.8 ms
Lead Channel Setting Sensing Sensitivity: 2 mV
MDC IDC LEAD IMPLANT DT: 20020906
MDC IDC LEAD IMPLANT DT: 20091026
MDC IDC LEAD LOCATION: 753858
MDC IDC LEAD LOCATION: 753860
MDC IDC LEAD MODEL: 4196
MDC IDC MSMT BATTERY REMAINING LONGEVITY: 66 mo
MDC IDC MSMT LEADCHNL LV IMPEDANCE VALUE: 430 Ohm
MDC IDC MSMT LEADCHNL RV IMPEDANCE VALUE: 550 Ohm
MDC IDC SESS DTM: 20170130085230
MDC IDC SET LEADCHNL RV PACING AMPLITUDE: 2.5 V
Pulse Gen Serial Number: 7238042

## 2016-01-07 ENCOUNTER — Encounter: Payer: Self-pay | Admitting: Cardiology

## 2016-01-13 DIAGNOSIS — D649 Anemia, unspecified: Secondary | ICD-10-CM | POA: Diagnosis not present

## 2016-01-13 DIAGNOSIS — Z79899 Other long term (current) drug therapy: Secondary | ICD-10-CM | POA: Diagnosis not present

## 2016-01-13 DIAGNOSIS — E559 Vitamin D deficiency, unspecified: Secondary | ICD-10-CM | POA: Diagnosis not present

## 2016-01-13 DIAGNOSIS — I10 Essential (primary) hypertension: Secondary | ICD-10-CM | POA: Diagnosis not present

## 2016-01-13 DIAGNOSIS — N183 Chronic kidney disease, stage 3 (moderate): Secondary | ICD-10-CM | POA: Diagnosis not present

## 2016-01-13 DIAGNOSIS — R809 Proteinuria, unspecified: Secondary | ICD-10-CM | POA: Diagnosis not present

## 2016-01-14 DIAGNOSIS — R809 Proteinuria, unspecified: Secondary | ICD-10-CM | POA: Diagnosis not present

## 2016-01-14 DIAGNOSIS — N2581 Secondary hyperparathyroidism of renal origin: Secondary | ICD-10-CM | POA: Diagnosis not present

## 2016-01-14 DIAGNOSIS — D638 Anemia in other chronic diseases classified elsewhere: Secondary | ICD-10-CM | POA: Diagnosis not present

## 2016-01-14 DIAGNOSIS — N184 Chronic kidney disease, stage 4 (severe): Secondary | ICD-10-CM | POA: Diagnosis not present

## 2016-01-21 ENCOUNTER — Encounter: Payer: Self-pay | Admitting: Cardiology

## 2016-01-22 ENCOUNTER — Encounter: Payer: Self-pay | Admitting: Internal Medicine

## 2016-01-23 ENCOUNTER — Telehealth: Payer: Self-pay | Admitting: Cardiology

## 2016-01-23 NOTE — Telephone Encounter (Signed)
Spoke w/ pt and requested that he send remote transmission b/c his monitor has not updated in at least 8 days. Pt attempted to send transmission but his home monitor would not turn on. Pt is going to call tech services to get help w/ trouble shooting his monitor.

## 2016-01-26 ENCOUNTER — Telehealth: Payer: Self-pay | Admitting: Internal Medicine

## 2016-01-26 IMAGING — US US ABDOMEN LIMITED
1 series · 14 of 25 positions shown · non-contrast
Comparison: Abdominal CT 05/20/2014

CLINICAL DATA: Right upper quadrant pain for 1 month.

EXAM:
US ABDOMEN LIMITED - RIGHT UPPER QUADRANT

[Series 1: us abdomen limited · 0.24mm/px · 14 of 38 slices shown]
[im 1/38]
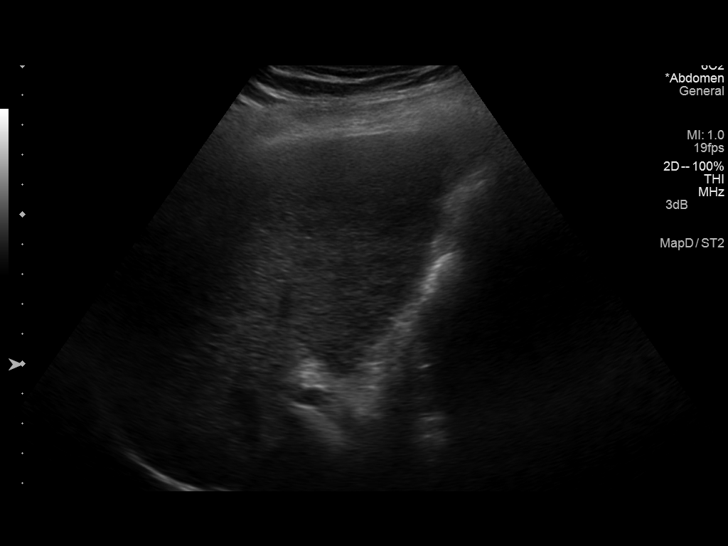
[im 4/38]
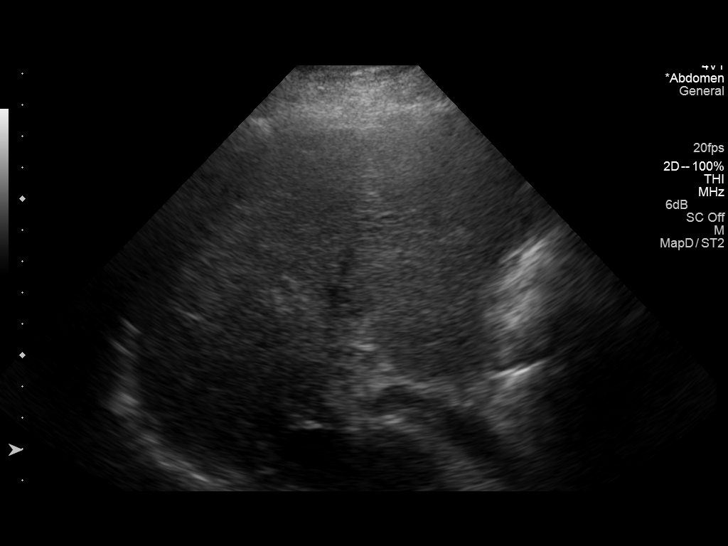
[im 7/38]
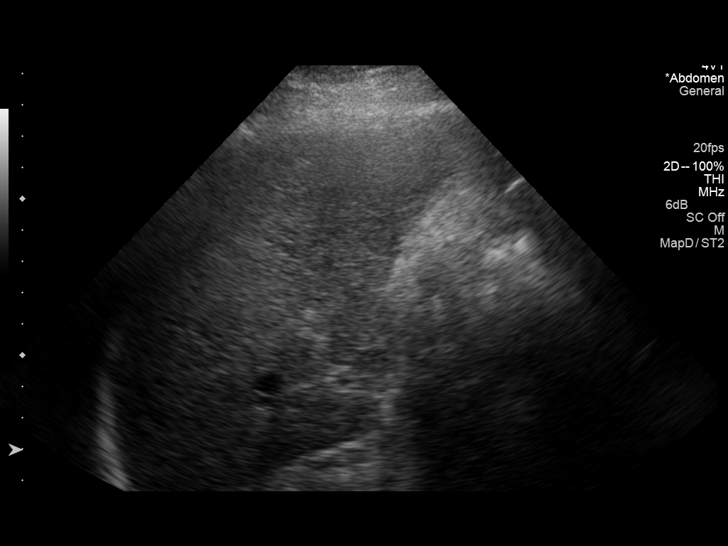
[im 10/38]
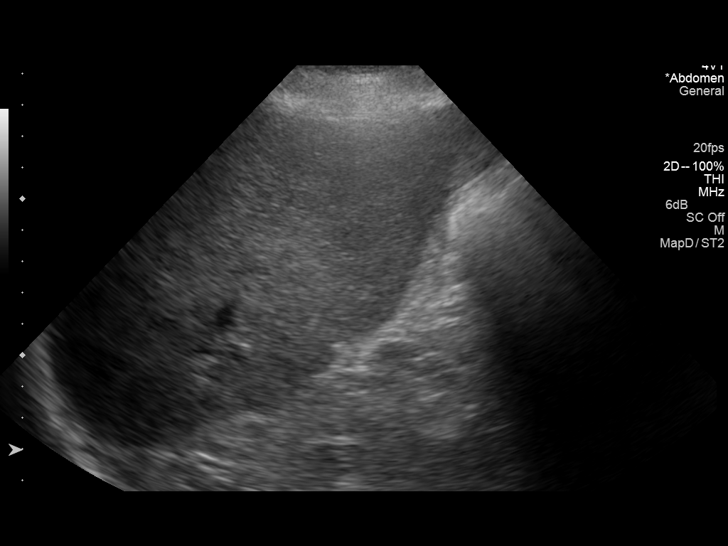
[im 13/38]
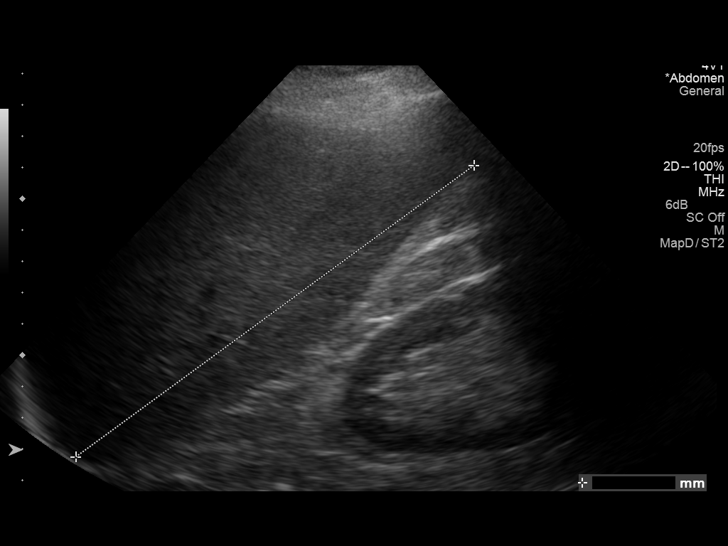
[im 14/38]
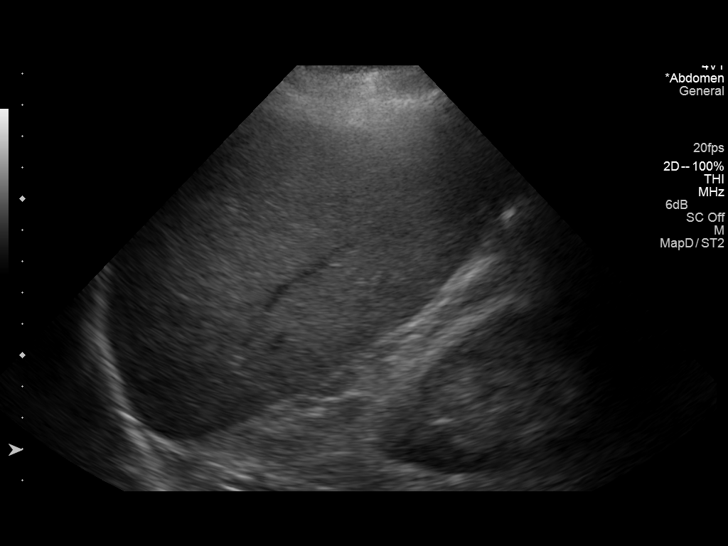
[im 17/38]
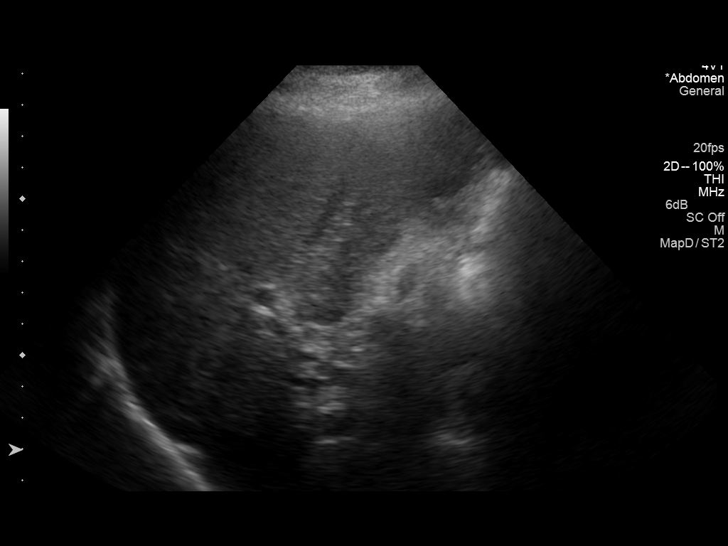
[im 21/38]
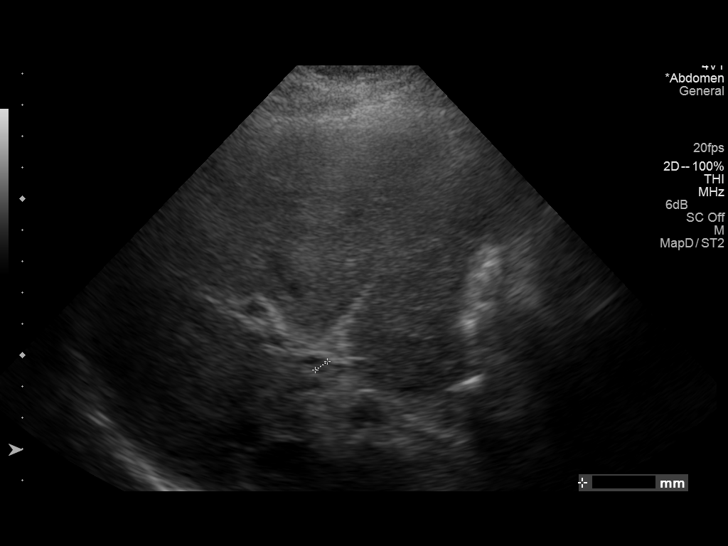
[im 24/38]
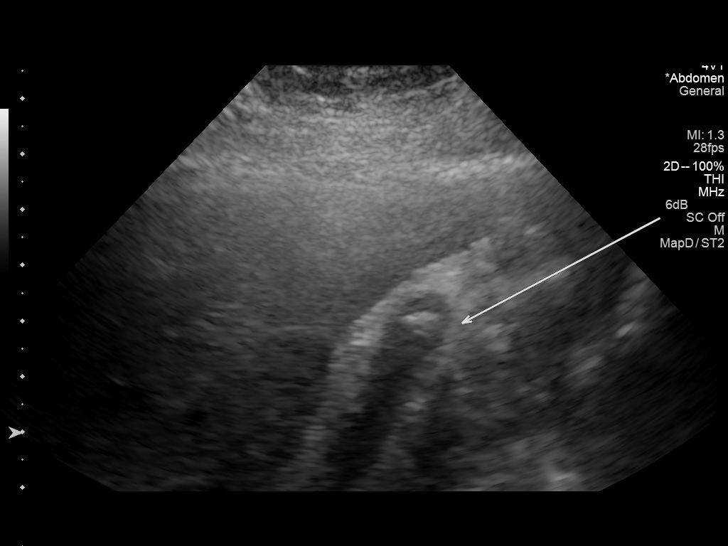
[im 25/38]
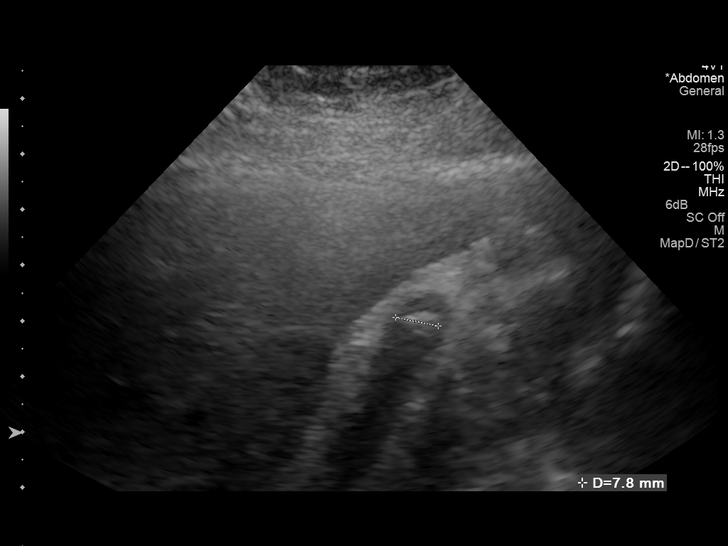
[im 28/38]
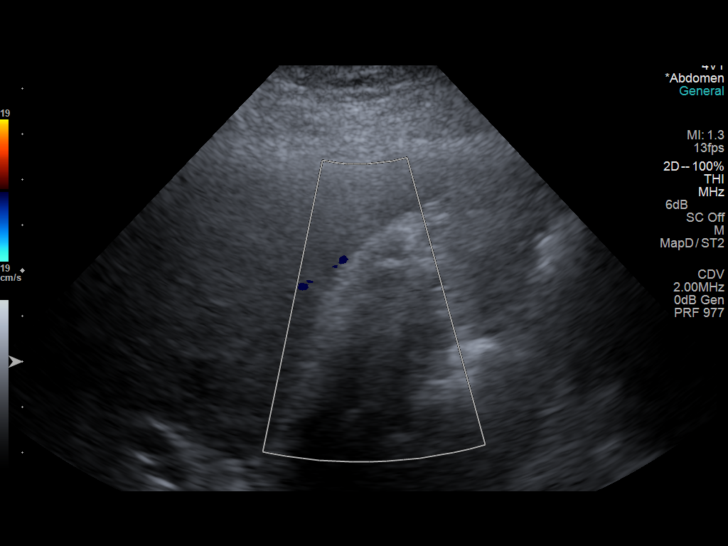
[im 31/38]
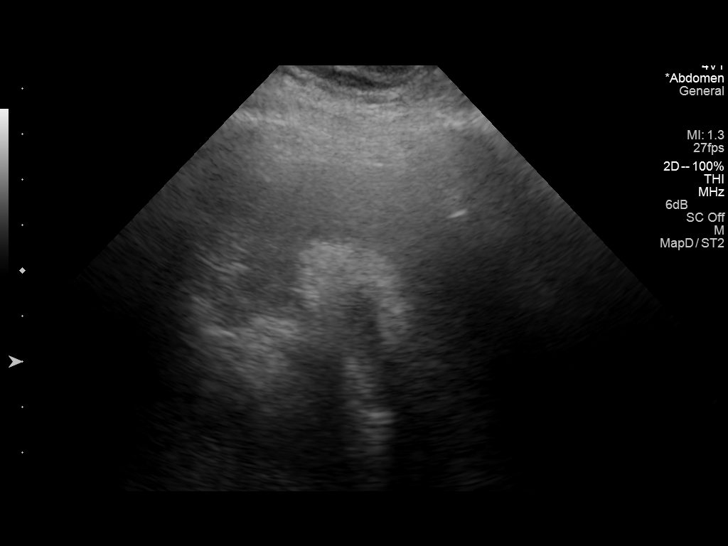
[im 34/38]
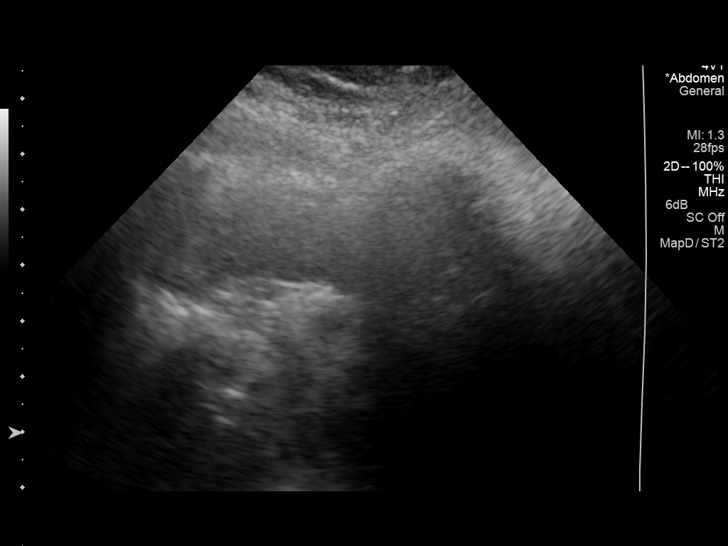
[im 38/38]
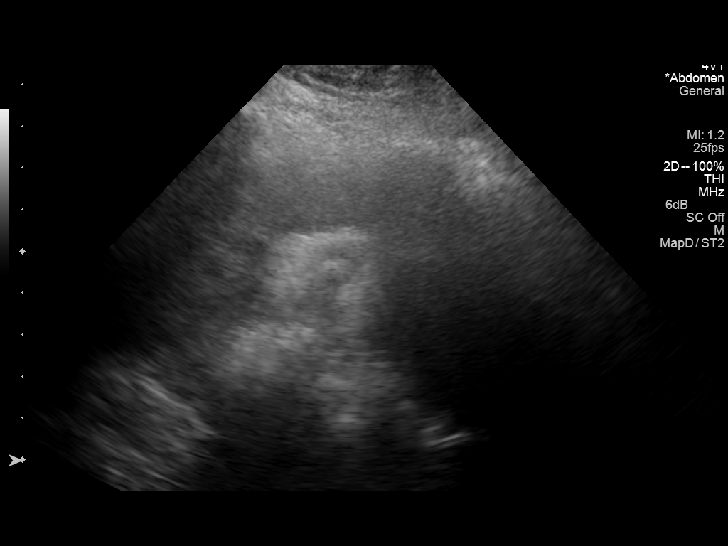

[14 of 25 positions shown; findings below may reference images not displayed]

FINDINGS: Gallbladder:

The gallbladder is decompressed and there is an echogenic focus at
the fundus that measures 0.8 cm. Small calcifications in the
gallbladder on the previous CT suggest that this echogenic focus is
related to a stone. Gallbladder wall appears to be thickened
measuring up to 0.5 cm but this may be related to gallbladder
contraction. Patient does not have a sonographic Murphy's sign.

Common bile duct:

Diameter: 0.5 cm

Liver:

The liver parenchyma is slightly heterogeneous without a focal
lesion.
IMPRESSION: Echogenic focus in the gallbladder fundus is most compatible with a
stone. Gallbladder is contracted and difficult to exclude
gallbladder wall thickening.

No biliary dilatation.

## 2016-01-26 NOTE — Telephone Encounter (Signed)
Confirmed with [employer]- Mr. Luhrs device does not have an advisory. Ms. Rehor made aware.

## 2016-01-26 NOTE — Telephone Encounter (Signed)
Returned call- emergency contact answered, pt not home. Explained ICD recall, advised pt to keep his monitor connected (Merlin last updated 01/25/16) and to call device clinic if he felt the vibratory alert. Pt due to see Dr. Lovena Le in April. She will let him know when he returns home, I advised him to call back if he has further questions.

## 2016-01-26 NOTE — Telephone Encounter (Signed)
Pt says his device is one of those for recall,please call.

## 2016-02-25 ENCOUNTER — Ambulatory Visit: Payer: Medicare Other | Admitting: Cardiology

## 2016-03-01 ENCOUNTER — Ambulatory Visit: Payer: Medicare Other | Admitting: Cardiology

## 2016-03-03 ENCOUNTER — Ambulatory Visit (INDEPENDENT_AMBULATORY_CARE_PROVIDER_SITE_OTHER): Payer: Medicare Other | Admitting: Cardiology

## 2016-03-03 ENCOUNTER — Encounter: Payer: Self-pay | Admitting: Cardiology

## 2016-03-03 ENCOUNTER — Encounter: Payer: Self-pay | Admitting: *Deleted

## 2016-03-03 VITALS — BP 105/66 | HR 75 | Ht 70.0 in | Wt 215.0 lb

## 2016-03-03 DIAGNOSIS — I482 Chronic atrial fibrillation, unspecified: Secondary | ICD-10-CM

## 2016-03-03 DIAGNOSIS — I5022 Chronic systolic (congestive) heart failure: Secondary | ICD-10-CM | POA: Diagnosis not present

## 2016-03-03 DIAGNOSIS — I2510 Atherosclerotic heart disease of native coronary artery without angina pectoris: Secondary | ICD-10-CM

## 2016-03-03 DIAGNOSIS — I255 Ischemic cardiomyopathy: Secondary | ICD-10-CM

## 2016-03-03 MED ORDER — METOLAZONE 2.5 MG PO TABS: ORAL_TABLET | ORAL | Status: DC

## 2016-03-03 NOTE — Patient Instructions (Signed)
Your physician wants you to follow-up in: [place] DR. BRANCH You will receive a reminder letter in the mail two months in advance. If you don't receive a letter, please call our office to schedule the follow-up appointment.  Your physician has recommended you make the following change in your medication:   START METOLAZONE 2.5 MG WEEKLY AS NEEDED FOR SWELLING  Your physician recommends that you return for lab work in: 2 [place]  Thank you for choosing [facility]!!

## 2016-03-03 NOTE — Progress Notes (Signed)
Patient ID: ELL BAMBERGER, male   DOB: 09-01-1956, 60 y.o.   MRN: [id number]     Clinical Summary Mr. Anne is a 60 y.o.male seen today for follow up of the following medical problems.    1. CAD/ICM  - prior BMS to LAD in 2001, repeat BMS to LAD in 2009.  - echo 04/2015 LVEF 35% - he has BiV AICD placed by Dr Graciela Husbands, followed by Dr Ladona Ridgel. Generator change 04/10/15.  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  - cath 07/2013 as reported below, no culprit vessels, managed medicallyHF - medical therapy has been somewhat limited previously due to problems with orthostatic dizziness and falls. Patient with previous fall resulting in subarachnoid hemorrhage.  - admit 04/2015 with acute on chronic systolic HF, also with NSTEMI and troponin of 4.29. Due to poor renal function a Lexiscan MPI was obtained which showed prior infarct without significant current ischemia.  - he was previously followed by [facility] heart failure, now followed by [facility]F clinic with Dr Gala Romney. Has been deemed not to be a VAD or transplant candidate.  - he has CRT-D followed by EP, s/p AV nodal ablation for afib  - baseline weight 190 lbs according to prior clinic notes. Weight in Jan weight was 199. Reports increased weight and edema at home. Home scales 208 lbs. Reports some dietary indiscretion. Chronic dose of toresmide 80mg  in AM and 40mg  in PM, went up to 100 and 60mg  without benefit. Breathing is stable.    2. Afib/Aflutter  - denies any palpitations  - has had some troubles with anemia in the past, followed by GI - s/p AV nodal ablation 08/2015, now with CRT-D.   3. Hyperlipidemia  - compliant with statin   4. Bilateral carotid stenosis - 01/2014 carotid US <50% bilateral - no neuro symptoms  5. CKD - followed by Dr Fausto Skillern   6. OSA - compliant with CPAP Past Medical History  Diagnosis Date  .  Essential hypertension, benign   . CAD (coronary artery disease)     a. BMS to LAD 2001 at [facility] b. PTCA/atherectomy ramus and BMS to LAD 2009  . Chronic systolic heart failure (HCC)   . GERD (gastroesophageal reflux disease)   . Paroxysmal atrial fibrillation (HCC)     a. on amiodarone, digoxin and Eliquis  . Adenocarcinoma of rectum (HCC)     a. 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 Prostate cancer (HCC)     a. s/p seed implants with chemo and radiation  . TIA (transient ischemic attack)   . HLD (hyperlipidemia)   . Orthostatic hypotension   . Dizziness     a. chronic. Admission for this 07/18/2014  . Ischemic cardiomyopathy     EF 18% by nuclear study 2016, multiple myocardial infarctions in past    . Obesity   . Hematuria   . History of blood transfusion     "I've had 2 units so far this year" (09/27/2015)  . Anemia   . SAH (subarachnoid hemorrhage) (HCC)     post-traumatic (fall) [facility] 12/2014  . PONV (postoperative nausea and vomiting)   . CHF (congestive heart failure) (HCC)   . Myocardial infarction (HCC) 2001  . HCAP (healthcare-associated pneumonia) 07/21/2015  . OSA on CPAP   . Chronic kidney disease, stage IV (severe) (HCC)   . DM type 2, uncontrolled, with renal complications (HCC)   . 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 apixaban (ELIQUIS) 5 MG TABS tablet Take 1 tablet (5 mg total) by mouth 2 (two) times daily. 60 tablet 3  . atorvastatin (LIPITOR) 80 MG tablet Take 1 tablet (80 mg total) by mouth daily. 30 tablet 6  . co-enzyme Q-10 50 MG capsule Take 50 mg by mouth every morning.     . ferrous sulfate 325 (65 FE) MG tablet Take 1 tablet (325 mg total) by mouth 2 (two) times daily with a meal. 60 tablet 0  . fluticasone (FLONASE) 50 MCG/ACT nasal spray Place 1 spray into both nostrils daily as needed for allergies.     Marland Kitchen glimepiride (AMARYL) 1 MG tablet Take 1 mg by mouth daily with breakfast.    .  Insulin Glargine (TOUJEO SOLOSTAR) 300 UNIT/ML SOPN Inject 20 Units into the skin every morning.    . metoprolol succinate (TOPROL-XL) 25 MG 24 hr tablet Take 0.5 tablets (12.5 mg total) by mouth 2 (two) times daily. 45 tablet 3  . Omega-3 Fatty Acids (FISH OIL) 1000 MG CAPS Take 1,000 mg by mouth 2 (two) times daily.    . pantoprazole (PROTONIX) 40 MG tablet Take 1 tablet (40 mg total) by mouth daily. 30 tablet 0  . potassium chloride SA (K-DUR,KLOR-CON) 20 MEQ tablet Take 2 tabs(40 meq) at start, then (20 meq) one tab daily 90 tablet 3  . PROAIR RESPICLICK 108 (90 BASE) MCG/ACT AEPB Inhale 2 puffs into the lungs every 6 (six) hours as needed (for breathing).   0  . torsemide (DEMADEX) 20 MG tablet Take by mouth. Take 80mg  in the AM and 40mg  in the PM    . torsemide (DEMADEX) 20 MG tablet Take 20 mg by mouth daily. Take 80mg  in the AM and 60 mg in the PM     No current facility-administered medications for this visit.     Past Surgical History  Procedure Laterality Date  . Cardiac defibrillator placement  2002  . Abdominal and perineal resection of rectum with total mesorectal excision  10/04/2007  . Colonoscopy  09/14/2011    Dr. Jena Gauss: via colostomy, Single pedunculated benign inflammatory polyp. Due for surveillance Oct 2015  . Colostomy  2008  . Colonoscopy N/A 07/02/2014    Procedure: COLONOSCOPY;  Surgeon: Corbin Ade, MD;  Location: [facility] ENDO SUITE;  Service: Endoscopy;  Laterality: N/A;  7:30 / COLONOSCOPY THRU COLOSTOMY  . Esophagogastroduodenoscopy N/A 07/02/2014    Procedure: ESOPHAGOGASTRODUODENOSCOPY (EGD);  Surgeon: Corbin Ade, MD;  Location: [facility] END[facility] SUITE;  Service: Endoscopy;  Laterality: N/A;  7:30  . Savory dilation N/A 07/02/2014    Procedure: SAVORY DILATION;  Surgeon: Corbin Ade, MD;  Location: [facility] ENDO SUITE;  Service: Endoscopy;  Laterality: N/A;  7:30  . Maloney dilation N/A 07/02/2014    Procedure: Elease Hashimoto DILATION;  Surgeon: Corbin Ade, MD;  Location: [facility]  ENDO SUITE;  Service: Endoscopy;  Laterality: N/A;  7:30  . Portacath placement  06/2007    "removed ~ 1 yr later"  . Left heart catheterization with coronary angiogram N/A 07/13/2013    Procedure: LEFT HEART CATHETERIZATION WITH CORONARY ANGIOGRAM;  Surgeon: [name], MD;  Location: [facility] CATH LAB;  Service: Cardiovascular;  Laterality: N/A;  . Colonoscopy N/A 12/11/2014    Dr. Jena Gauss via colostomy. Normal. Repeat in 2021.   Marland Kitchen Esophagogastroduodenoscopy N/A 12/11/2014    VZD:GLOVFI EGD  .  Cardiac defibrillator placement  2009    Upgraded to a BiV ICD  . Right heart catheterization N/A 02/24/2015    Procedure: RIGHT HEART CATH;  Surgeon: Dolores Patty, MD;  Location: [facility] CATH LAB;  Service: Cardiovascular;  Laterality: N/A;  . Ep implantable device N/A 04/10/2015    Procedure: Ppm/Biv Ppm Generator Changeout;  Surgeon: Marinus Maw, MD;  Location: [facility] INVASIVE CV LAB CUPID;  Service: Cardiovascular;  Laterality: N/A;  . Bi-ventricular implantable cardioverter defibrillator  (crt-d)  2009  . Cardiac catheterization  08/2001; 2009    ; /notes 07/10/2013  . Coronary angioplasty with stent placement  2001; ~ 2006    "1 + 1"   . Givens capsule study N/A 07/23/2015    Procedure: GIVENS CAPSULE STUDY;  Surgeon: Charlott Rakes, MD;  Location: [facility] ENDOSCOPY;  Service: Endoscopy;  Laterality: N/A;  . Colonoscopy N/A 08/24/2015    Procedure: COLONOSCOPY;  Surgeon: Ruffin Frederick, MD;  Location: [facility] OR;  Service: Gastroenterology;  Laterality: N/A;  . Electrophysiologic study N/A 08/28/2015    Procedure: AV Node Ablation;  Surgeon: Will Jorja Loa, MD;  Location: [facility] INVASIVE CV LAB;  Service: Cardiovascular;  Laterality: N/A;  . Av fistula placement Right 09/16/2015    Procedure: ARTERIOVENOUS (AV) FISTULA CREATION - BRACHIOCEPHALIC;  Surgeon: Sherren Kerns, MD;  Location: [facility] OR;  Service: Vascular;  Laterality: Right;     No Known Allergies    Family History  Problem Relation Age of  Onset  . Colon cancer Mother 37  . Colon cancer Sister 48  . Coronary artery disease Father   . Colon cancer Other     2 cousins, succumbed to illness     Social History Mr. Brixius reports that he has never smoked. He has never used smokeless tobacco. Mr. Weatherwax reports that he does not drink alcohol.   Review of Systems CONSTITUTIONAL: No weight loss, fever, chills, weakness or fatigue.  HEENT: Eyes: No visual loss, blurred vision, double vision or yellow sclerae.No hearing loss, sneezing, congestion, runny nose or sore throat.  SKIN: No rash or itching.  CARDIOVASCULAR: per HPI RESPIRATORY: No shortness of breath, cough or sputum.  GASTROINTESTINAL: No anorexia, nausea, vomiting or diarrhea. No abdominal pain or blood.  GENITOURINARY: No burning on urination, no polyuria NEUROLOGICAL: No headache, dizziness, syncope, paralysis, ataxia, numbness or tingling in the extremities. No change in bowel or bladder control.  MUSCULOSKELETAL: No muscle, back pain, joint pain or stiffness.  LYMPHATICS: No enlarged nodes. No history of splenectomy.  PSYCHIATRIC: No history of depression or anxiety.  ENDOCRINOLOGIC: No reports of sweating, cold or heat intolerance. No polyuria or polydipsia.  Marland Kitchen   Physical Examination Filed Vitals:   03/03/16 0952  BP: 105/66  Pulse: 75   Filed Vitals:   03/03/16 0952  Height: 5\' 10"  (1.778 m)  Weight: 215 lb (97.523 kg)    Gen: resting comfortably, no acute distress HEENT: no scleral icterus, pupils equal round and reactive, no palptable cervical adenopathy,  CV: RRR, no m/r/g, no jvd Resp: Clear to auscultation bilaterally GI: abdomen is soft, non-tender, non-distended, normal bowel sounds, no hepatosplenomegaly MSK: extremities are warm,2+ bilateral edema  Skin: warm, no rash Neuro:  no focal deficits Psych: appropriate affect   Diagnostic Studies 08/2013 Echo  LVEF 30-35%, + WMAs, grade I diastolic dysfunction, mild MR,   07/2013 Cath   HEMODYNAMICS:   AO SYSTOLIC/AO DIASTOLIC: 102/75  ANGIOGRAPHIC RESULTS:   1. Left main; normal  2. LAD; the  entire proximal third of the LAD was fluoroscopically calcified. The proximal third had approximately 50-60% segmental stenosis. The stent was widely patent in the proximal third of the LAD with 30-40% in-stent restenosis". There was a moderate size first diagonal Macklin Jacquin and the ramus distribution that was occluded in its proximal portion and filled by collaterals. This was noted to be highly diseased at his last cath in 2009. There was 50-60% segmental stenosis in the middle third and 70% in the distal/apical third. There were 2 small marginal branches arising from the middle third that had 90% ostial stenoses unchanged from prior cath 3. Left circumflex; dominant with 99% long segmental proximal OM1 stenosis in a small to medium-size vessel unchanged from prior cath 4. Right coronary artery; nondominant with 50% mid and 80% distal stenosis unchanged from prior cath  5. 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 The only change in his anatomy from 5 years ago was occlusion of the high first diagonal Gracen Ringwald which was severely diffusely diseased previously. There are no "culprit vessels. The patient is already on maximal medical therapy. Plans will be continued medical therapy. The sheath was removed and a TR Band was placed on the right wrist to achieve patent hemostasis. The patient left the Cath Lab in stable condition. He'll be gently hydrated and Coumadin will be restarted.   02/2014 Echo  Study Conclusions  - Procedure narrative: Transthoracic echocardiography. Image quality was suboptimal. The study was technically difficult, as a result of poor sound wave transmission. - Left ventricle: Systolic function is severely  reduced, estimated EF 15-20%. Severe diffuse hypokinesis is noted. The cavity size was moderately dilated. Wall thickness was increased in a pattern of moderate LVH. Diastolic dysfunction is seen, indeterminate grade. The apex was poorly visualized. Doppler parameters are consistent with high ventricular filling pressure. - Ventricular septum: Septal motion showed abnormal function and dyssynergy. These changes are consistent with right ventricular pacing. - Aortic valve: Trileaflet; mildly thickened leaflets. There was no stenosis. - Mitral valve: Mildly thickened leaflets . Mild tethering of leaflet motion due to severe left ventricular dysfunction. Mild regurgitation. - Left atrium: The atrium was mildly to moderately dilated. - Right ventricle: The cavity size was normal. Wall thickness was normal. Pacer wire or catheter noted in right ventricle. Systolic function was mildly to moderately reduced. - Right atrium: Pacer wire or catheter noted in right atrium. - Atrial septum: The septum bowed from left to right, consistent with increased left atrial pressure. - Tricuspid valve: Mild regurgitation. - Pulmonary arteries: PA peak pressure: 42mm Hg (S). Mildly elevated pulmonary pressures.   09/2014 Echo Study Conclusions  - Left ventricle: The cavity size was mildly dilated. Wall thickness was increased in a pattern of moderate LVH. Diastolic function is abnormal, indeterminant grade. Systolic function was moderately to severely reduced. The estimated ejection fraction was in the range of 30% to 35%. Evaluation of LVEF is limited by limited visualization of the endocardium, consider contrast study for more accurate evaluation. - Aorta: The visualized portion of the proximal ascending aorta is mildly dilated measuring 3.8 cm. - Aortic root: The aortic root was normal in size. - Left atrium: The atrium was moderately dilated. - Right ventricle: Not well  visualized. Grossly appears moderately enlarged with low normal systolic function. - Right atrium: Not well visualized. Grossly appears moderately enlarged. - Pulmonary arteries: PA peak pressure: 35 mm Hg (S).  PASP is borderline elevated. - Inferior vena cava: The vessel was normal in size. The respirophasic diameter changes were in the normal range (>= 50%), consistent with normal central venous pressure. - Technically difficult study.   04/2015 Lexiscan  Findings consistent with ischemia.  This is a high risk study.  The left ventricular ejection fraction is severely decreased (<30%).  Defect 1: There is a small defect of moderate severity present in the mid anterior and mid anteroseptal location.  Defect 2: There is a large defect present in the apical anterior, apical septal, apical inferior and apex location.  04/2015 Echo Study Conclusions  - Left ventricle: POor acoustic windows limit study even with Definity use . LVEF is approximately is apprxoiamtely 35% with hypokinesis/akinesis of distal 1/3 of LV. The cavity size was severely dilated. Systolic function was normal. Wall motion was normal; there were no regional wall motion abnormalities. - Aortic valve: There was trivial regurgitation. - Mitral valve: There was mild regurgitation. - Left atrium: The atrium was severely dilated. - Right ventricle: Systolic function was mildly reduced. - Pulmonary arteries: PA peak pressure: 55 mm Hg (S).     Assessment and Plan  1.CAD/ICM/Chronic systolic HF - recent weight gain and edema. Not responding to torsemide, he increased his dose to 100mg  in AM and 60mg  in PM without improvement. Will start pron metolazone 2.5mg , check BMET in 2-3 weeks.   2. Hyperlipidemia  - continue high dose statin in setting of know CAD.  - repeat lipid panel;  3. Afib /aflutter  - no current symptoms, we willcontinue current meds    4. OSA - compliant with  CPAP  5. CKD - followed by Dr Fausto Skillern    F/u 6 months. He has CHF appt next month   Antoine Poche, M.D

## 2016-03-09 ENCOUNTER — Telehealth: Payer: Self-pay | Admitting: *Deleted

## 2016-03-09 NOTE — Telephone Encounter (Signed)
Left message for pt to call office. Does he plan to follow up here?

## 2016-03-14 ENCOUNTER — Encounter (HOSPITAL_COMMUNITY): Payer: Self-pay | Admitting: Emergency Medicine

## 2016-03-14 ENCOUNTER — Emergency Department (HOSPITAL_COMMUNITY)
Admission: EM | Admit: 2016-03-14 | Discharge: 2016-03-14 | Disposition: A | Discharge status: Home / Self Care | Payer: Medicare Other | Attending: Emergency Medicine | Admitting: Emergency Medicine

## 2016-03-14 DIAGNOSIS — I130 Hypertensive heart and chronic kidney disease with heart failure and stage 1 through stage 4 chronic kidney disease, or unspecified chronic kidney disease: Secondary | ICD-10-CM | POA: Diagnosis not present

## 2016-03-14 DIAGNOSIS — S61211A Laceration without foreign body of left index finger without damage to nail, initial encounter: Secondary | ICD-10-CM | POA: Insufficient documentation

## 2016-03-14 DIAGNOSIS — I480 Paroxysmal atrial fibrillation: Secondary | ICD-10-CM | POA: Insufficient documentation

## 2016-03-14 DIAGNOSIS — E669 Obesity, unspecified: Secondary | ICD-10-CM | POA: Diagnosis not present

## 2016-03-14 DIAGNOSIS — Z23 Encounter for immunization: Secondary | ICD-10-CM | POA: Insufficient documentation

## 2016-03-14 DIAGNOSIS — S61201A Unspecified open wound of left index finger without damage to nail, initial encounter: Secondary | ICD-10-CM | POA: Diagnosis not present

## 2016-03-14 DIAGNOSIS — E1122 Type 2 diabetes mellitus with diabetic chronic kidney disease: Secondary | ICD-10-CM | POA: Insufficient documentation

## 2016-03-14 DIAGNOSIS — Y999 Unspecified external cause status: Secondary | ICD-10-CM | POA: Insufficient documentation

## 2016-03-14 DIAGNOSIS — S61219A Laceration without foreign body of unspecified finger without damage to nail, initial encounter: Secondary | ICD-10-CM

## 2016-03-14 DIAGNOSIS — Z79899 Other long term (current) drug therapy: Secondary | ICD-10-CM | POA: Diagnosis not present

## 2016-03-14 DIAGNOSIS — T148 Other injury of unspecified body region: Secondary | ICD-10-CM | POA: Diagnosis not present

## 2016-03-14 DIAGNOSIS — N184 Chronic kidney disease, stage 4 (severe): Secondary | ICD-10-CM | POA: Insufficient documentation

## 2016-03-14 DIAGNOSIS — I5022 Chronic systolic (congestive) heart failure: Secondary | ICD-10-CM | POA: Insufficient documentation

## 2016-03-14 DIAGNOSIS — Y939 Activity, unspecified: Secondary | ICD-10-CM | POA: Diagnosis not present

## 2016-03-14 DIAGNOSIS — W260XXA Contact with knife, initial encounter: Secondary | ICD-10-CM | POA: Insufficient documentation

## 2016-03-14 DIAGNOSIS — S6992XA Unspecified injury of left wrist, hand and finger(s), initial encounter: Secondary | ICD-10-CM | POA: Diagnosis not present

## 2016-03-14 DIAGNOSIS — Y929 Unspecified place or not applicable: Secondary | ICD-10-CM | POA: Insufficient documentation

## 2016-03-14 DIAGNOSIS — I2510 Atherosclerotic heart disease of native coronary artery without angina pectoris: Secondary | ICD-10-CM | POA: Diagnosis not present

## 2016-03-14 MED ORDER — TETANUS-DIPHTH-ACELL PERTUSSIS 5-2.5-18.5 LF-MCG/0.5 IM SUSP
0.5000 mL | Freq: Once | INTRAMUSCULAR | Status: AC
  Administered 2016-03-14: 0.5 mL via INTRAMUSCULAR
  Filled 2016-03-14: qty 0.5

## 2016-03-14 MED ORDER — TRAMADOL HCL 50 MG PO TABS: 50.0000 mg | ORAL_TABLET | Freq: Four times a day (QID) | ORAL | Status: DC | PRN

## 2016-03-14 MED ORDER — LIDOCAINE HCL (PF) 2 % IJ SOLN
INTRAMUSCULAR | Status: AC
  Administered 2016-03-14: 17:00:00
  Filled 2016-03-14: qty 10

## 2016-03-14 NOTE — ED Notes (Signed)
Pt states he cut his left index finger with a knife just prior to arrival.  Pt talking on cell phone during triage and would not hang up.

## 2016-03-14 NOTE — ED Provider Notes (Signed)
CSN: [id number]     Arrival date & time 03/14/16  1500 History  By signing my name below, I, [name], attest that this documentation has been prepared under the direction and in the presence of [employer], PA-C. Electronically Signed: Randa [name], ED Scribe. 03/14/2016. 3:54 PM.    Chief Complaint  Patient presents with  . Laceration    Patient is a 60 y.o. male presenting with skin laceration. The history is provided by the patient. No language interpreter was used.  Laceration  HPI Comments: IBHAN MARKUNAS is a 60 y.o. male who presents to the Emergency Department complaining of new left index finger laceration onset 1 hour PTA. Pt states that he accidentally cut his finger with a sharp knife. Pt states that the bleeding is controlled with pressure. Pt reports that he is on eliquis. Pt denies numbness. Pt states he is unsure of his tetanus status.   Past Medical History  Diagnosis Date  . Essential hypertension, benign   . CAD (coronary artery disease)     a. BMS to LAD 2001 at [facility] b. PTCA/atherectomy ramus and BMS to LAD 2009  . Chronic systolic heart failure ([place])   . GERD (gastroesophageal reflux disease)   . Paroxysmal atrial fibrillation (HCC)     a. on amiodarone, digoxin and Eliquis  . Adenocarcinoma of rectum ([place])     a. 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 Prostate cancer ([place])     a. s/p seed implants with chemo and radiation  . TIA (transient ischemic attack)   . HLD (hyperlipidemia)   . Orthostatic hypotension   . Dizziness     a. chronic. Admission for this 07/18/2014  . Ischemic cardiomyopathy     EF 18% by nuclear study 2016, multiple myocardial infarctions in past    . Obesity   . Hematuria   . History of blood transfusion     "I've had 2 units so far this year" (09/27/2015)  . Anemia   . SAH (subarachnoid hemorrhage) ([place])     post-traumatic (fall) [facility] 12/2014  . PONV (postoperative nausea and vomiting)   . CHF (congestive heart failure) ([place])   . Myocardial  infarction ([place]) 2001  . HCAP (healthcare-associated pneumonia) 07/21/2015  . OSA on CPAP   . Chronic kidney disease, stage IV (severe) ([place])   . DM type 2, uncontrolled, with renal complications ([place])   . Colostomy in place [facility])    Past Surgical History  Procedure Laterality Date  . Cardiac defibrillator placement  2002  . Abdominal and perineal resection of rectum with total mesorectal excision  10/04/2007  . Colonoscopy  09/14/2011    Dr. Gala Romney: via colostomy, Single pedunculated benign inflammatory polyp. Due for surveillance Oct 2015  . Colostomy  2008  . Colonoscopy N/A 07/02/2014    Procedure: COLONOSCOPY;  Surgeon: Daneil Dolin, MD;  [facility];  Service: Endoscopy;  Laterality: N/A;  7:30 / COLONOSCOPY THRU COLOSTOMY  . Esophagogastroduodenoscopy N/A 07/02/2014    Procedure: ESOPHAGOGASTRODUODENOSCOPY (EGD);  Surgeon: Daneil Dolin, MD;  [facility];  Service: Endoscopy;  Laterality: N/A;  7:30  . Savory dilation N/A 07/02/2014    Procedure: SAVORY DILATION;  Surgeon: Daneil Dolin, MD;  [facility];  Service: Endoscopy;  Laterality: N/A;  7:30  . Maloney dilation N/A 07/02/2014    Procedure: Venia Minks DILATION;  Surgeon: Daneil Dolin, MD;  [facility];  Service: Endoscopy;  Laterality: N/A;  7:30  . Portacath  placement  06/2007    "removed ~ 1 yr later"  . Left heart catheterization with coronary angiogram N/A 07/13/2013    Procedure: LEFT HEART CATHETERIZATION WITH CORONARY ANGIOGRAM;  Surgeon: Lorretta Harp, MD;  Location: [facility] CATH LAB;  Service: Cardiovascular;  Laterality: N/A;  . Colonoscopy N/A 12/11/2014    Dr. Gala Romney via colostomy. Normal. Repeat in 2021.   Marland Kitchen Esophagogastroduodenoscopy N/A 12/11/2014    [id number] EGD  . Cardiac defibrillator placement  2009    Upgraded to a BiV ICD  . Right heart catheterization N/A 02/24/2015    Procedure: RIGHT HEART CATH;  Surgeon: Jolaine Artist, MD;  Location: [facility] CATH LAB;  Service:  Cardiovascular;  Laterality: N/A;  . Ep implantable device N/A 04/10/2015    Procedure: Ppm/Biv Ppm Generator Changeout;  Surgeon: Evans Lance, MD;  Location: [facility] INVASIVE CV LAB CUPID;  Service: Cardiovascular;  Laterality: N/A;  . Bi-ventricular implantable cardioverter defibrillator  (crt-d)  2009  . Cardiac catheterization  08/2001; 2009    ; /notes 07/10/2013  . Coronary angioplasty with stent placement  2001; ~ 2006    "1 + 1"   . Givens capsule study N/A 07/23/2015    Procedure: GIVENS CAPSULE STUDY;  Surgeon: Wilford Corner, MD;  Location: [facility] ENDOSCOPY;  Service: Endoscopy;  Laterality: N/A;  . Colonoscopy N/A 08/24/2015    Procedure: COLONOSCOPY;  Surgeon: Manus Gunning, MD;  Location: [place];  Service: Gastroenterology;  Laterality: N/A;  . Electrophysiologic study N/A 08/28/2015    Procedure: AV Node Ablation;  Surgeon: Will Meredith Leeds, MD;  Location: [place] CV LAB;  Service: Cardiovascular;  Laterality: N/A;  . Av fistula placement Right 09/16/2015    Procedure: ARTERIOVENOUS (AV) FISTULA CREATION - BRACHIOCEPHALIC;  Surgeon: Elam Dutch, MD;  Location[facility] OR;  Service: Vascular;  Laterality: Right;   Family History  Problem Relation Age of Onset  . Colon cancer Mother 51  . Colon cancer Sister 11  . Coronary artery disease Father   . Colon cancer Other     2 cousins, succumbed to illness   Social History  Substance Use Topics  . Smoking status: Never Smoker   . Smokeless tobacco: Never Used  . Alcohol Use: No     Comment: Former user 45 years ago    Review of Systems  Skin: Positive for wound.  Neurological: Negative for numbness.  All other systems reviewed and are negative.     Allergies  Review of patient's allergies indicates no known allergies.  Home Medications   Prior to Admission medications   Medication Sig Start Date End Date Taking? Authorizing Provider  apixaban (ELIQUIS) 5 MG TABS tablet Take 1 tablet (5 mg total) by mouth 2  (two) times daily. 10/14/15  Yes Jolaine Artist, MD  atorvastatin (LIPITOR) 80 MG tablet Take 1 tablet (80 mg total) by mouth daily. 09/04/15  Yes Larey Dresser, MD  co-enzyme Q-10 50 MG capsule Take 50 mg by mouth every morning.    Yes Historical Provider, MD  ferrous sulfate 325 (65 FE) MG tablet Take 1 tablet (325 mg total) by mouth 2 (two) times daily with a meal. 07/24/15  Yes Shanker Kristeen Mans, MD  fluticasone (FLONASE) 50 MCG/ACT nasal spray Place 1 spray into both nostrils daily as needed for allergies.  11/21/13  Yes Historical Provider, MD  metolazone (ZAROXOLYN) 2.5 MG tablet Take 1 tab weekly as needed for swelling Patient taking differently: Take 2.5 mg by mouth daily  as needed. for swelling 03/03/16  Yes Arnoldo Lenis, MD  metoprolol succinate (TOPROL-XL) 25 MG 24 hr tablet Take 0.5 tablets (12.5 mg total) by mouth 2 (two) times daily. 09/08/15  Yes Evans Lance, MD  Omega-3 Fatty Acids (FISH OIL) 1000 MG CAPS Take 1,000 mg by mouth 2 (two) times daily.   Yes Historical Provider, MD  pantoprazole (PROTONIX) 40 MG tablet Take 1 tablet (40 mg total) by mouth daily. 07/24/15  Yes Shanker Kristeen Mans, MD  potassium chloride SA (K-DUR,KLOR-CON) 20 MEQ tablet Take 20 mEq by mouth daily.   Yes Historical Provider, MD  PROAIR RESPICLICK [id number] (90 BASE) MCG/ACT AEPB Inhale 2 puffs into the lungs every 6 (six) hours as needed (for breathing).  08/15/15  Yes Historical Provider, MD  torsemide (DEMADEX) 20 MG tablet Take 40-80 mg by mouth every morning. 80mg  in the & 40 mg in the evening   Yes Historical Provider, MD  traMADol (ULTRAM) 50 MG tablet Take 1 tablet (50 mg total) by mouth every 6 (six) hours as needed. 03/14/16   Evalee Jefferson, PA-C   BP 115/57 mmHg  Pulse 71  Temp(Src) 97.5 F (36.4 C) (Oral)  Resp 14  Ht 5\' 10"  (1.778 m)  Wt 85.73 kg  BMI 27.12 kg/m2  SpO2 100%   Physical Exam  Constitutional: He is oriented to person, place, and time. He appears well-developed and  well-nourished. No distress.  HENT:  Head: Normocephalic and atraumatic.  Eyes: Conjunctivae and EOM are normal.  Neck: Neck supple. No tracheal deviation present.  Cardiovascular: Normal rate.   Pulmonary/Chest: Effort normal. No respiratory distress.  Musculoskeletal: Normal range of motion.  Neurological: He is alert and oriented to person, place, and time.  Skin: Skin is warm and dry.  2 cm linear laceration dorsal left proximal index finger, hemostatic. Distal sensation intact, full ROM of finger.   Psychiatric: He has a normal mood and affect. His behavior is normal.  Nursing note and vitals reviewed.   ED Course  Procedures (including critical care time) LACERATION REPAIR PROCEDURE NOTE The patient's identification was confirmed and consent was obtained. This procedure was performed by Evalee Jefferson, PA-C at 4:16 PM. Site: left dorsal index finger Sterile procedures observed: Yes Anesthetic used (type and amt): lidocaine 2% 2cc Suture type/size: ethilon 4-0 Length:2 cm # of Sutures: 4 Technique:simple interrupted  Complexity: simple Antibx ointment applied: NO Tetanus UTD or ordered: Ordered  Site anesthetized, irrigated with NS, explored without evidence of foreign body, wound well approximated, site covered with dry, sterile dressing.  Patient tolerated procedure well without complications. Instructions for care discussed verbally and patient provided with additional written instructions for homecare and f/u.  DIAGNOSTIC STUDIES: Oxygen Saturation is 100% on RA, normal by my interpretation.    COORDINATION OF CARE: 3:51 PM-Discussed treatment plan with pt at bedside and pt agreed to plan.     Labs Review Labs Reviewed - No data to display  Imaging Review No results found.    EKG Interpretation None      MDM   Final diagnoses:  Laceration of finger, initial encounter    Wound care instructions given.  Pt advised to have sutures removed in 10 days,   Return here sooner for any signs of infection including redness, swelling, worse pain or drainage of pus.     I personally performed the services described in this documentation, which was scribed in my presence. The recorded information has been reviewed and is accurate.  Evalee Jefferson, PA-C 03/15/16 1801  Milton Ferguson, MD 03/16/16 [phone]

## 2016-03-14 NOTE — ED Notes (Signed)
Pt seen and eval by EDPa for initial assessment.

## 2016-03-14 NOTE — Discharge Instructions (Signed)
Laceration Care, Adult A laceration is a cut that goes through all of the layers of the skin and into the tissue that is right under the skin. Some lacerations heal on their own. Others need to be closed with stitches (sutures), staples, skin adhesive strips, or skin glue. Proper laceration care minimizes the risk of infection and helps the laceration to heal better. HOW TO CARE FOR YOUR LACERATION If sutures or staples were used:  Keep the wound clean and dry.  If you were given a bandage (dressing), you should change it at least one time per day or as told by your health care provider. You should also change it if it becomes wet or dirty.  Keep the wound completely dry for the first 24 hours or as told by your health care provider. After that time, you may shower or bathe. However, make sure that the wound is not soaked in water until after the sutures or staples have been removed.  Clean the wound one time each day or as told by your health care provider:  Wash the wound with soap and water.  Rinse the wound with water to remove all soap.  Pat the wound dry with a clean towel. Do not rub the wound.  After cleaning the wound, apply a thin layer of antibiotic ointmentas told by your health care provider. This will help to prevent infection and keep the dressing from sticking to the wound.  Have the sutures or staples removed as told by your health care provider. If skin adhesive strips were used:  Keep the wound clean and dry.  If you were given a bandage (dressing), you should change it at least one time per day or as told by your health care provider. You should also change it if it becomes dirty or wet.  Do not get the skin adhesive strips wet. You may shower or bathe, but be careful to keep the wound dry.  If the wound gets wet, pat it dry with a clean towel. Do not rub the wound.  Skin adhesive strips fall off on their own. You may trim the strips as the wound heals. Do not  remove skin adhesive strips that are still stuck to the wound. They will fall off in time. If skin glue was used:  Try to keep the wound dry, but you may briefly wet it in the shower or bath. Do not soak the wound in water, such as by swimming.  After you have showered or bathed, gently pat the wound dry with a clean towel. Do not rub the wound.  Do not do any activities that will make you sweat heavily until the skin glue has fallen off on its own.  Do not apply liquid, cream, or ointment medicine to the wound while the skin glue is in place. Using those may loosen the film before the wound has healed.  If you were given a bandage (dressing), you should change it at least one time per day or as told by your health care provider. You should also change it if it becomes dirty or wet.  If a dressing is placed over the wound, be careful not to apply tape directly over the skin glue. Doing that may cause the glue to be pulled off before the wound has healed.  Do not pick at the glue. The skin glue usually remains in place for 5-10 days, then it falls off of the skin. General Instructions  Take over-the-counter and prescription   medicines only as told by your health care provider.  If you were prescribed an antibiotic medicine or ointment, take or apply it as told by your doctor. Do not stop using it even if your condition improves.  To help prevent scarring, make sure to cover your wound with sunscreen whenever you are outside after stitches are removed, after adhesive strips are removed, or when glue remains in place and the wound is healed. Make sure to wear a sunscreen of at least 30 SPF.  Do not scratch or pick at the wound.  Keep all follow-up visits as told by your health care provider. This is important.  Check your wound every day for signs of infection. Watch for:  Redness, swelling, or pain.  Fluid, blood, or pus.  Raise (elevate) the injured area above the level of your heart  while you are sitting or lying down, if possible. SEEK MEDICAL CARE IF:  You received a tetanus shot and you have swelling, severe pain, redness, or bleeding at the injection site.  You have a fever.  A wound that was closed breaks open.  You notice a bad smell coming from your wound or your dressing.  You notice something coming out of the wound, such as wood or glass.  Your pain is not controlled with medicine.  You have increased redness, swelling, or pain at the site of your wound.  You have fluid, blood, or pus coming from your wound.  You notice a change in the color of your skin near your wound.  You need to change the dressing frequently due to fluid, blood, or pus draining from the wound.  You develop a new rash.  You develop numbness around the wound. SEEK IMMEDIATE MEDICAL CARE IF:  You develop severe swelling around the wound.  Your pain suddenly increases and is severe.  You develop painful lumps near the wound or on skin that is anywhere on your body.  You have a red streak going away from your wound.  The wound is on your hand or foot and you cannot properly move a finger or toe.  The wound is on your hand or foot and you notice that your fingers or toes look pale or bluish.   This information is not intended to replace advice given to you by your health care provider. Make sure you discuss any questions you have with your health care provider.   Document Released: 11/22/2005 Document Revised: 04/08/2015 Document Reviewed: 11/18/2014 Elsevier Interactive Patient Education 2016 [employer]  

## 2016-03-16 IMAGING — NM NM HEPATOBILIARY IMAGE, INC GB
3 series · 13 of 13 positions shown · non-contrast
Comparison: None.

CLINICAL DATA: RIGHT upper quadrant abdominal pain for 2 months,
nausea, vomiting

EXAM:
NUCLEAR MEDICINE HEPATOBILIARY IMAGING WITH GALLBLADDER EF
TECHNIQUE: Sequential images of the abdomen were obtained [DATE] minutes
following intravenous administration of radiopharmaceutical. After
oral ingestion of Ensure, gallbladder ejection fraction was
determined. At 60 min, normal ejection fraction is greater than 33%.
RADIOPHARMACEUTICALS:  5 mCi Kc-33m Choletec IV

[Series 1: biliary · 3.25mm/px · 6 of 45 frames shown]
[frame 4/45]
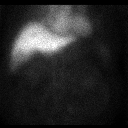
[frame 12/45]
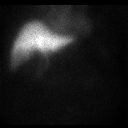
[frame 19/45]
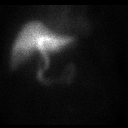
[frame 27/45]
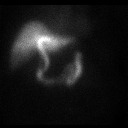
[frame 34/45]
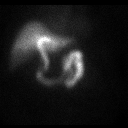
[frame 42/45]
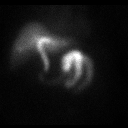

[Series 2: rt lat · 3.25mm/px · 1 of 1 slices shown]
[im 1/1]
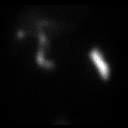

[Series 3: gbef · 3.25mm/px · 6 of 60 frames shown]
[frame 6/60]
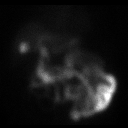
[frame 16/60]
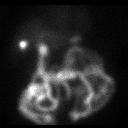
[frame 26/60]
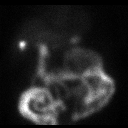
[frame 36/60]
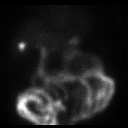
[frame 46/60]
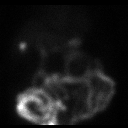
[frame 56/60]
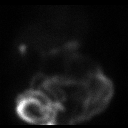

[13 of 13 positions shown; findings below may reference images not displayed]

FINDINGS: Normal tracer extraction from bloodstream indicating normal
hepatocellular function.

Normal excretion of tracer into biliary tree.

Gallbladder visualized at 49 min.

Small bowel visualized at 18 min.

No hepatic retention of tracer.

Subjectively normal emptying of tracer from gallbladder following
fatty meal stimulation.

Calculated gallbladder ejection fraction is 57%, normal.

Patient experienced no symptoms following Ensure ingestion.
IMPRESSION: Normal exam.

## 2016-03-17 ENCOUNTER — Ambulatory Visit (INDEPENDENT_AMBULATORY_CARE_PROVIDER_SITE_OTHER): Payer: Medicare Other | Admitting: Internal Medicine

## 2016-03-17 ENCOUNTER — Encounter: Payer: Self-pay | Admitting: Internal Medicine

## 2016-03-17 VITALS — BP 118/68 | HR 88 | Ht 70.0 in | Wt 188.0 lb

## 2016-03-17 DIAGNOSIS — I5022 Chronic systolic (congestive) heart failure: Secondary | ICD-10-CM

## 2016-03-17 DIAGNOSIS — I255 Ischemic cardiomyopathy: Secondary | ICD-10-CM

## 2016-03-17 LAB — CUP PACEART INCLINIC DEVICE CHECK
Battery Remaining Longevity: 66 mo
Battery Remaining Percentage: 83 %
Date Time Interrogation Session: 20170412110004
HIGH POWER IMPEDANCE MEASURED VALUE: 43 Ohm
Implantable Lead Implant Date: 20091026
Implantable Lead Location: 753858
Implantable Lead Location: 753859
Implantable Lead Location: 753860
Lead Channel Impedance Value: 510 Ohm
Lead Channel Impedance Value: 600 Ohm
Lead Channel Pacing Threshold Amplitude: 0.75 V
Lead Channel Setting Pacing Amplitude: 2 V
Lead Channel Setting Pacing Pulse Width: 0.5 ms
Lead Channel Setting Pacing Pulse Width: 0.8 ms
Lead Channel Setting Sensing Sensitivity: 2 mV
MDC IDC LEAD IMPLANT DT: 20020906
MDC IDC LEAD IMPLANT DT: 20091026
MDC IDC LEAD MODEL: 4196
MDC IDC LEAD SERIAL: 125581
MDC IDC MSMT LEADCHNL LV PACING THRESHOLD PULSEWIDTH: 0.5 ms
MDC IDC MSMT LEADCHNL RA IMPEDANCE VALUE: 390 Ohm
MDC IDC MSMT LEADCHNL RA SENSING INTR AMPL: 3.1 mV
MDC IDC MSMT LEADCHNL RV PACING THRESHOLD AMPLITUDE: 1 V
MDC IDC MSMT LEADCHNL RV PACING THRESHOLD PULSEWIDTH: 0.8 ms
MDC IDC PG SERIAL: 7238042
MDC IDC SET LEADCHNL RV PACING AMPLITUDE: 2.5 V

## 2016-03-17 NOTE — Patient Instructions (Signed)
Your physician wants you to follow-up in: 1 Year with Dr. Taylor. You will receive a reminder letter in the mail two months in advance. If you don't receive a letter, please call our office to schedule the follow-up appointment.  Remote monitoring is used to monitor your Pacemaker of ICD from home. This monitoring reduces the number of office visits required to check your device to one time per year. It allows us to keep an eye on the functioning of your device to ensure it is working properly. You are scheduled for a device check from home on 06/16/16. You may send your transmission at any time that day. If you have a wireless device, the transmission will be sent automatically. After your physician reviews your transmission, you will receive a postcard with your next transmission date.  Your physician recommends that you continue on your current medications as directed. Please refer to the Current Medication list given to you today.  If you need a refill on your cardiac medications before your next appointment, please call your pharmacy.  Thank you for choosing [facility]!    

## 2016-03-17 NOTE — Progress Notes (Signed)
HPI [name] returns today for ongoing ICD evaluation and management. He is a pleasant 60 yo man, s/p MI, s/p ICD implant in 2002 and a BiV ICD upgrade in 2009. In the interim, he has  He has been anti-coagulated with coumadin but has had some non-compliance. His heart failure symptoms are better after AV node ablation followed by uptitration of his medical therapy. No ICD shocks although he has a h/o VT .  No Known Allergies   Current Outpatient Prescriptions  Medication Sig Dispense Refill  . apixaban (ELIQUIS) 5 MG TABS tablet Take 1 tablet (5 mg total) by mouth 2 (two) times daily. 60 tablet 3  . atorvastatin (LIPITOR) 80 MG tablet Take 1 tablet (80 mg total) by mouth daily. 30 tablet 6  . co-enzyme Q-10 50 MG capsule Take 50 mg by mouth every morning.     . ferrous sulfate 325 (65 FE) MG tablet Take 1 tablet (325 mg total) by mouth 2 (two) times daily with a meal. 60 tablet 0  . fluticasone (FLONASE) 50 MCG/ACT nasal spray Place 1 spray into both nostrils daily as needed for allergies.     . metolazone (ZAROXOLYN) 2.5 MG tablet Take 1 tab weekly as needed for swelling (Patient taking differently: Take 2.5 mg by mouth daily as needed. for swelling) 12 tablet 0  . metoprolol succinate (TOPROL-XL) 25 MG 24 hr tablet Take 0.5 tablets (12.5 mg total) by mouth 2 (two) times daily. 45 tablet 3  . Omega-3 Fatty Acids (FISH OIL) 1000 MG CAPS Take 1,000 mg by mouth 2 (two) times daily.    . pantoprazole (PROTONIX) 40 MG tablet Take 1 tablet (40 mg total) by mouth daily. 30 tablet 0  . potassium chloride SA (K-DUR,KLOR-CON) 20 MEQ tablet Take 20 mEq by mouth daily.    Marland Kitchen PROAIR RESPICLICK 123XX123 (90 BASE) MCG/ACT AEPB Inhale 2 puffs into the lungs every 6 (six) hours as needed (for breathing).   0  . torsemide (DEMADEX) 20 MG tablet Take 40-80 mg by mouth every morning. 80mg  in the & 40 mg in the evening    . traMADol (ULTRAM) 50 MG tablet Take 1 tablet (50 mg total) by mouth every 6 (six)  hours as needed. 15 tablet 0   No current facility-administered medications for this visit.     Past Medical History  Diagnosis Date  . Essential hypertension, benign   . CAD (coronary artery disease)     a. BMS to LAD 2001 at [facility] b. PTCA/atherectomy ramus and BMS to LAD 2009  . Chronic systolic heart failure ([place])   . GERD (gastroesophageal reflux disease)   . Paroxysmal atrial fibrillation (HCC)     a. on amiodarone, digoxin and Eliquis  . Adenocarcinoma of rectum ([place])     a. 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 Prostate cancer ([place])     a. s/p seed implants with chemo and radiation  . TIA (transient ischemic attack)   . HLD (hyperlipidemia)   . Orthostatic hypotension   . Dizziness     a. chronic. Admission for this 07/18/2014  . Ischemic cardiomyopathy     EF 18% by nuclear study 2016, multiple myocardial infarctions in past    . Obesity   . Hematuria   . History of blood transfusion     "I've had 2 units so far this year" (09/27/2015)  . Anemia   . SAH (subarachnoid hemorrhage) ([place])     post-traumatic (fall) [facility] 12/2014  . PONV (  postoperative nausea and vomiting)   . CHF (congestive heart failure) ([place])   . Myocardial infarction ([place]) 2001  . HCAP (healthcare-associated pneumonia) 07/21/2015  . OSA on CPAP   . Chronic kidney disease, stage IV (severe) ([place])   . DM type 2, uncontrolled, with renal complications ([place])   . Colostomy in place [facility])     ROS:   All systems reviewed and negative except as noted in the HPI.   Past Surgical History  Procedure Laterality Date  . Cardiac defibrillator placement  2002  . Abdominal and perineal resection of rectum with total mesorectal excision  10/04/2007  . Colonoscopy  09/14/2011    Dr. Gala Romney: via colostomy, Single pedunculated benign inflammatory polyp. Due for surveillance Oct 2015  . Colostomy  2008  . Colonoscopy N/A 07/02/2014    Procedure: COLONOSCOPY;  Surgeon: Daneil Dolin, MD;  [facility];  Service: Endoscopy;   Laterality: N/A;  7:30 / COLONOSCOPY THRU COLOSTOMY  . Esophagogastroduodenoscopy N/A 07/02/2014    Procedure: ESOPHAGOGASTRODUODENOSCOPY (EGD);  Surgeon: Daneil Dolin, MD;  [facility];  Service: Endoscopy;  Laterality: N/A;  7:30  . Savory dilation N/A 07/02/2014    Procedure: SAVORY DILATION;  Surgeon: Daneil Dolin, MD;  [facility];  Service: Endoscopy;  Laterality: N/A;  7:30  . Maloney dilation N/A 07/02/2014    Procedure: Venia Minks DILATION;  Surgeon: Daneil Dolin, MD;  [facility];  Service: Endoscopy;  Laterality: N/A;  7:30  . Portacath placement  06/2007    "removed ~ 1 yr later"  . Left heart catheterization with coronary angiogram N/A 07/13/2013    Procedure: LEFT HEART CATHETERIZATION WITH CORONARY ANGIOGRAM;  Surgeon: Lorretta Harp, MD;  Location: [facility] CATH LAB;  Service: Cardiovascular;  Laterality: N/A;  . Colonoscopy N/A 12/11/2014    Dr. Gala Romney via colostomy. Normal. Repeat in 2021.   Marland Kitchen Esophagogastroduodenoscopy N/A 12/11/2014    [id number] EGD  . Cardiac defibrillator placement  2009    Upgraded to a BiV ICD  . Right heart catheterization N/A 02/24/2015    Procedure: RIGHT HEART CATH;  Surgeon: Jolaine Artist, MD;  Location: [facility] CATH LAB;  Service: Cardiovascular;  Laterality: N/A;  . Ep implantable device N/A 04/10/2015    Procedure: Ppm/Biv Ppm Generator Changeout;  Surgeon: Evans Lance, MD;  Location: [place] INVASIVE CV LAB CUPID;  Service: Cardiovascular;  Laterality: N/A;  . Bi-ventricular implantable cardioverter defibrillator  (crt-d)  2009  . Cardiac catheterization  08/2001; 2009    ; /notes 07/10/2013  . Coronary angioplasty with stent placement  2001; ~ 2006    "1 + 1"   . Givens capsule study N/A 07/23/2015    Procedure: GIVENS CAPSULE STUDY;  Surgeon: Wilford Corner, MD;  Location: [facility] ENDOSCOPY;  Service: Endoscopy;  Laterality: N/A;  . Colonoscopy N/A 08/24/2015    Procedure: COLONOSCOPY;  Surgeon: Manus Gunning, MD;   Location: [place];  Service: Gastroenterology;  Laterality: N/A;  . Electrophysiologic study N/A 08/28/2015    Procedure: AV Node Ablation;  Surgeon: Will Meredith Leeds, MD;  Location: [place] CV LAB;  Service: Cardiovascular;  Laterality: N/A;  . Av fistula placement Right 09/16/2015    Procedure: ARTERIOVENOUS (AV) FISTULA CREATION - BRACHIOCEPHALIC;  Surgeon: Elam Dutch, MD;  Location: [facility] OR;  Service: Vascular;  Laterality: Right;     Family History  Problem Relation Age of Onset  . Colon cancer Mother 71  . Colon cancer  Sister 48  . Coronary artery disease Father   . Colon cancer Other     2 cousins, succumbed to illness     Social History   Social History  . Marital Status: Married    Spouse Name: N/A  . Number of Children: 1  . Years of Education: N/A   Occupational History  .     Social History Main Topics  . Smoking status: Never Smoker   . Smokeless tobacco: Never Used  . Alcohol Use: No     Comment: Former user 45 years ago  . Drug Use: No  . Sexual Activity: No   Other Topics Concern  . Not on file   Social History Narrative   Married 65 years   1 son, 2 grandkids    Denies alcohol, drugs, tobacco      BP 118/68 mmHg  Pulse 88  Ht 5\' 10"  (1.778 m)  Wt 188 lb (85.276 kg)  BMI 26.98 kg/m2  SpO2 93%  Physical Exam:  Stable appearing middle aged man, NAD HEENT: Unremarkable Neck:  7 cm JVD, no thyromegally Back:  No CVA tenderness Lungs:  Clear with no wheezes, rales, or rhonchi HEART:  Regular rate rhythm, no murmurs, no rubs, no clicks, soft S4 gallop Abd:  soft, positive bowel sounds, no organomegally, no rebound, no guarding Ext:  2 plus pulses, no edema, no cyanosis, no clubbing right hand is bandaged Skin:  No rashes no nodules Neuro:  CN II through XII intact, motor grossly intact   DEVICE  Normal device function.  See PaceArt for details. Underlying rhythm is NSR  Assess/Plan: 1. Chronic systolic heart failure - his  symptoms are class 2. He will continue his current meds. I have asked him to maintain a low sodium diet. 2. HTN - His blood pressure is well controlled. No change in meds. 3. ICD - his St. Jude BiV ICD is working normally. Will recheck in several months.  Mikle Bosworth.D.

## 2016-03-18 ENCOUNTER — Other Ambulatory Visit: Payer: Self-pay | Admitting: Cardiology

## 2016-03-18 DIAGNOSIS — I5022 Chronic systolic (congestive) heart failure: Secondary | ICD-10-CM | POA: Diagnosis not present

## 2016-03-18 LAB — BASIC METABOLIC PANEL
BUN: 64 mg/dL — AB (ref 7–25)
CALCIUM: 9.4 mg/dL (ref 8.6–10.3)
CHLORIDE: 91 mmol/L — AB (ref 98–110)
CO2: 28 mmol/L (ref 20–31)
CREATININE: 2.67 mg/dL — AB (ref 0.70–1.25)
Glucose, Bld: 345 mg/dL — ABNORMAL HIGH (ref 65–99)
Potassium: 3.5 mmol/L (ref 3.5–5.3)
Sodium: 133 mmol/L — ABNORMAL LOW (ref 135–146)

## 2016-03-18 LAB — LIPID PANEL
Cholesterol: 87 mg/dL — ABNORMAL LOW (ref 125–200)
HDL: 23 mg/dL — ABNORMAL LOW (ref >=40)
LDL CALC: 41 mg/dL (ref <130)
TRIGLYCERIDES: 113 mg/dL (ref <150)
Total CHOL/HDL Ratio: 3.8 Ratio (ref <=5.0)
VLDL: 23 mg/dL (ref <30)

## 2016-03-18 LAB — MAGNESIUM: Magnesium: 2.3 mg/dL (ref 1.5–2.5)

## 2016-03-23 ENCOUNTER — Telehealth: Payer: Self-pay | Admitting: *Deleted

## 2016-03-23 NOTE — Telephone Encounter (Signed)
Pt aware, routed to pcp 

## 2016-03-23 NOTE — Telephone Encounter (Signed)
-----   Message from Arnoldo Lenis, MD sent at 03/19/2016 11:09 AM EDT ----- Labs are overall stable. Renal function is abnormal but stable from last check. Continue current meds  J BrancH MD

## 2016-03-24 ENCOUNTER — Telehealth: Payer: Self-pay | Admitting: Nurse Practitioner

## 2016-03-24 ENCOUNTER — Ambulatory Visit: Payer: Medicare Other | Admitting: Nurse Practitioner

## 2016-03-24 ENCOUNTER — Encounter: Payer: Self-pay | Admitting: Nurse Practitioner

## 2016-03-24 NOTE — Telephone Encounter (Signed)
Noted  

## 2016-03-24 NOTE — Telephone Encounter (Signed)
PATIENT WAS A NO SHOW AND LETTER SENT  °

## 2016-03-30 IMAGING — CR DG CHEST 1V PORT
1 series · 1 of 1 positions shown · non-contrast
Comparison: 09/27/2014

CLINICAL DATA: Shortness of breath.

EXAM:
PORTABLE CHEST - 1 VIEW

[portable]
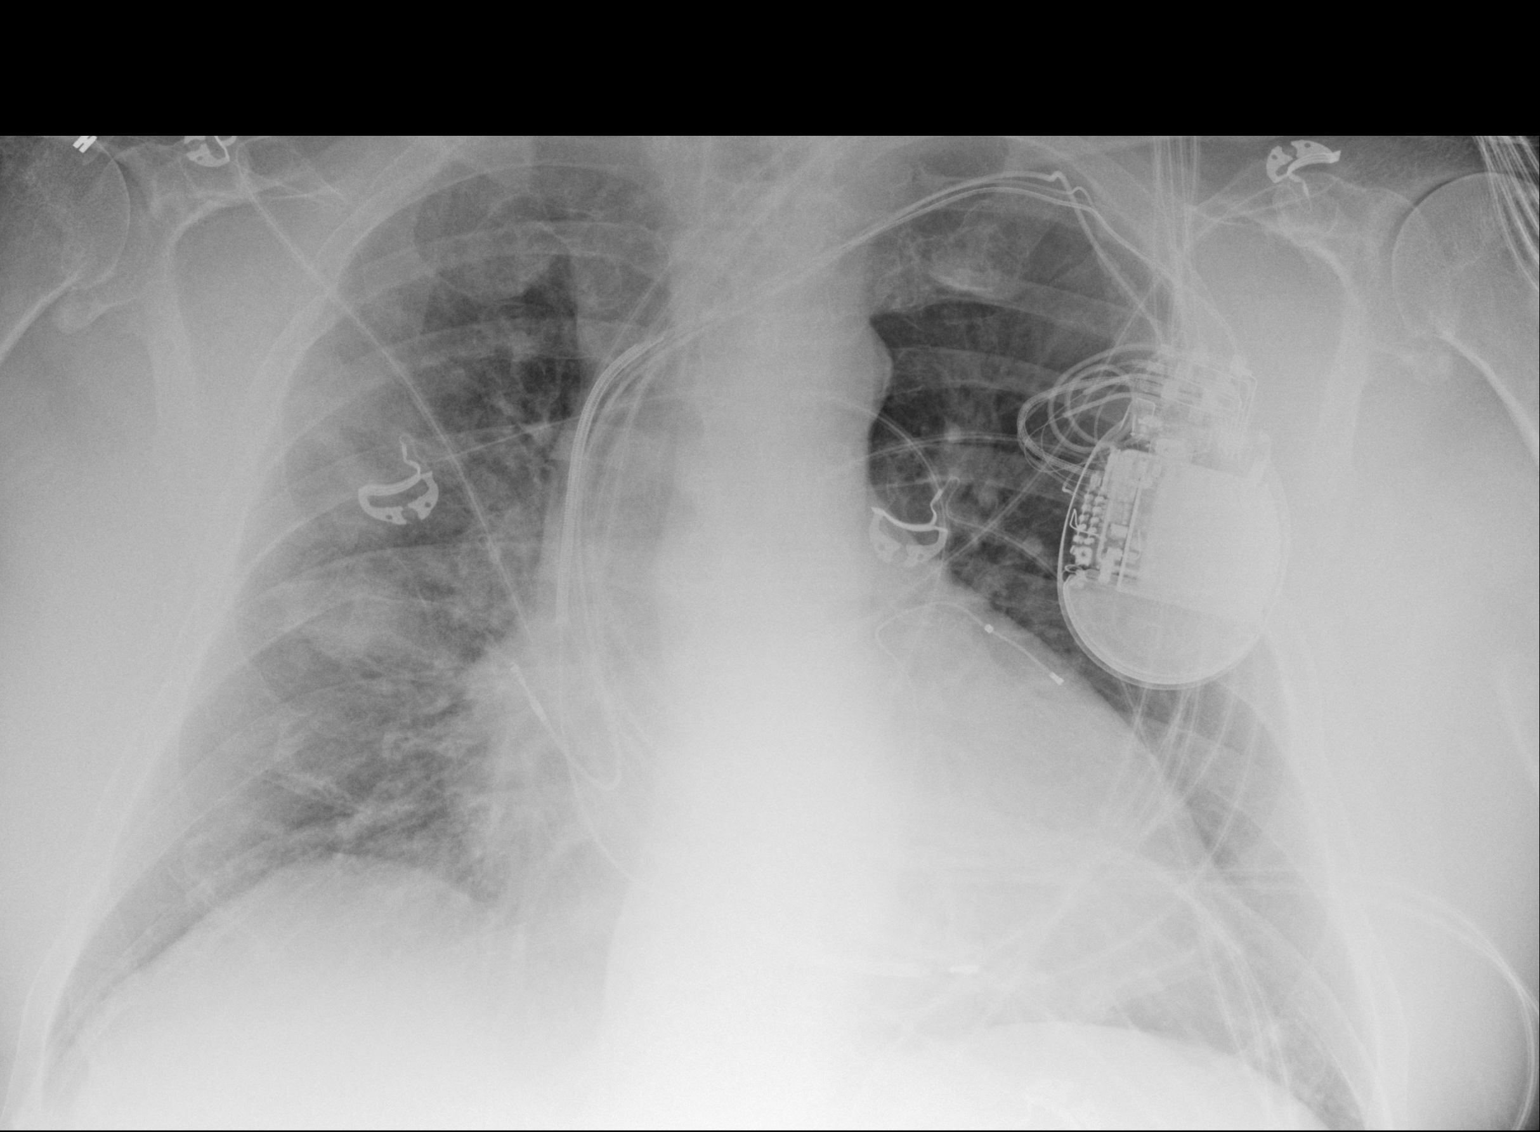

[1 of 1 positions shown; findings below may reference images not displayed]

FINDINGS: Stable cardiac enlargement and appearance of a biventricular
pacing/ICD device. Lungs show similar interstitial edema/mild CHF as
the prior study. No significant pleural fluid is identified. No
evidence of focal pulmonary consolidation or pneumothorax.
IMPRESSION: Interstitial edema/ mild CHF and stable cardiomegaly.

## 2016-03-31 DIAGNOSIS — E1140 Type 2 diabetes mellitus with diabetic neuropathy, unspecified: Secondary | ICD-10-CM | POA: Diagnosis not present

## 2016-03-31 DIAGNOSIS — Z1389 Encounter for screening for other disorder: Secondary | ICD-10-CM | POA: Diagnosis not present

## 2016-03-31 DIAGNOSIS — Z0000 Encounter for general adult medical examination without abnormal findings: Secondary | ICD-10-CM | POA: Diagnosis not present

## 2016-03-31 DIAGNOSIS — I4891 Unspecified atrial fibrillation: Secondary | ICD-10-CM | POA: Diagnosis not present

## 2016-03-31 DIAGNOSIS — E663 Overweight: Secondary | ICD-10-CM | POA: Diagnosis not present

## 2016-03-31 DIAGNOSIS — Z6829 Body mass index (BMI) 29.0-29.9, adult: Secondary | ICD-10-CM | POA: Diagnosis not present

## 2016-04-12 ENCOUNTER — Other Ambulatory Visit (HOSPITAL_COMMUNITY): Payer: Self-pay | Admitting: *Deleted

## 2016-04-12 ENCOUNTER — Telehealth (HOSPITAL_COMMUNITY): Payer: Self-pay | Admitting: Vascular Surgery

## 2016-04-12 DIAGNOSIS — I5022 Chronic systolic (congestive) heart failure: Secondary | ICD-10-CM

## 2016-04-12 MED ORDER — METOPROLOL SUCCINATE ER 25 MG PO TB24: 12.5000 mg | ORAL_TABLET | Freq: Two times a day (BID) | ORAL | Status: DC

## 2016-04-12 MED ORDER — TORSEMIDE 20 MG PO TABS
ORAL_TABLET | ORAL | Status: DC
Start: 2016-04-12 — End: 2016-06-21

## 2016-04-12 NOTE — Telephone Encounter (Signed)
Pt needs refill on Metroprolol

## 2016-04-19 ENCOUNTER — Other Ambulatory Visit: Payer: Self-pay | Admitting: *Deleted

## 2016-04-19 ENCOUNTER — Telehealth: Payer: Self-pay | Admitting: *Deleted

## 2016-04-19 NOTE — Telephone Encounter (Signed)
Call from pt reporting he has called a few times to reschedule missed appt and has not heard back. Pt was supposed to have scan and office visit. Provided phone # for pt to reschedule scan. Orders entered for office visit.

## 2016-04-26 ENCOUNTER — Other Ambulatory Visit (HOSPITAL_COMMUNITY): Payer: Self-pay | Admitting: *Deleted

## 2016-04-26 MED ORDER — APIXABAN 5 MG PO TABS: 5.0000 mg | ORAL_TABLET | Freq: Two times a day (BID) | ORAL | Status: DC

## 2016-05-04 IMAGING — CT CT HEAD W/O CM
1 series · 15 of 30 positions shown, 19 images · non-contrast
Comparison: CT of the head July 17, 2014

CLINICAL DATA: Dizziness and weakness for a few days.

EXAM:
CT HEAD WITHOUT CONTRAST
TECHNIQUE: Contiguous axial images were obtained from the base of the skull
through the vertex without intravenous contrast.

[Series 2: headtrauma 4.8 h37s · axial · 0.43mm/px · z∈[+85,+237]mm · 15 of 36 slices shown, 19 images]
[im 2/36  brain]
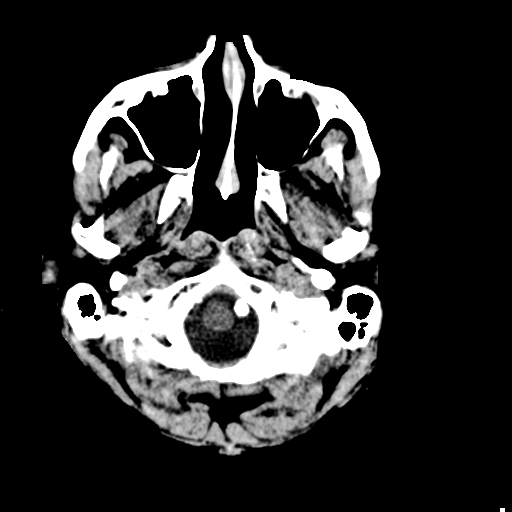
[im 2/36  bone]
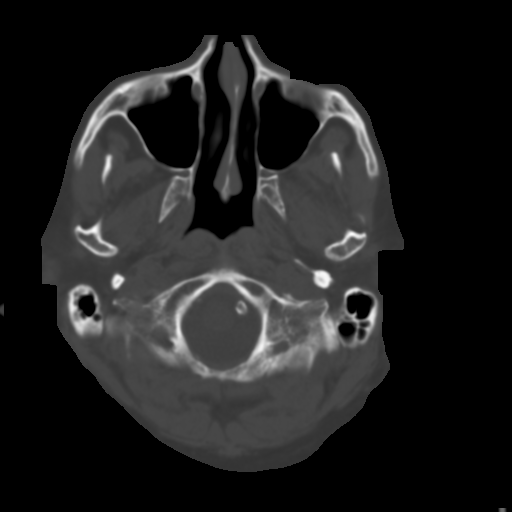
[im 4/36  brain]
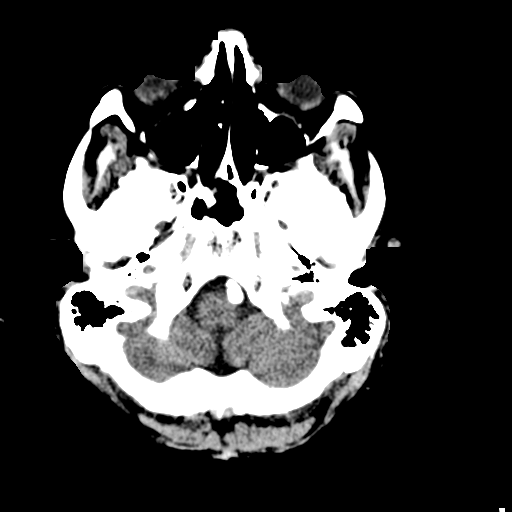
[im 7/36  brain]
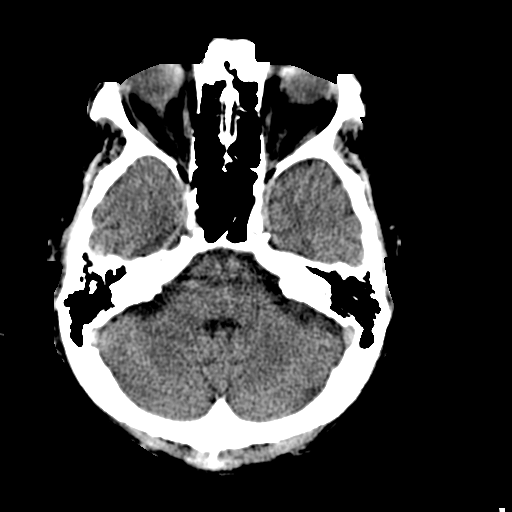
[im 9/36  brain]
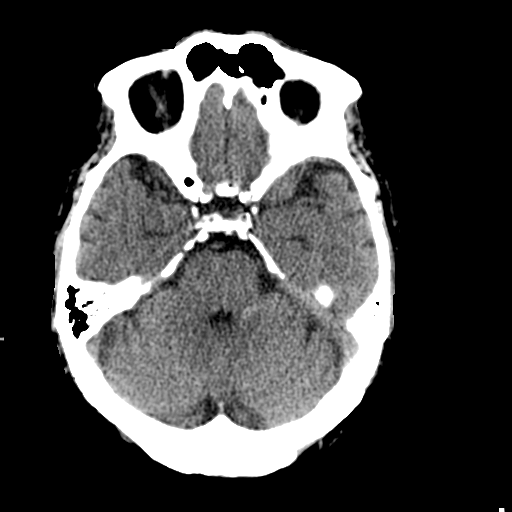
[im 11/36  brain]
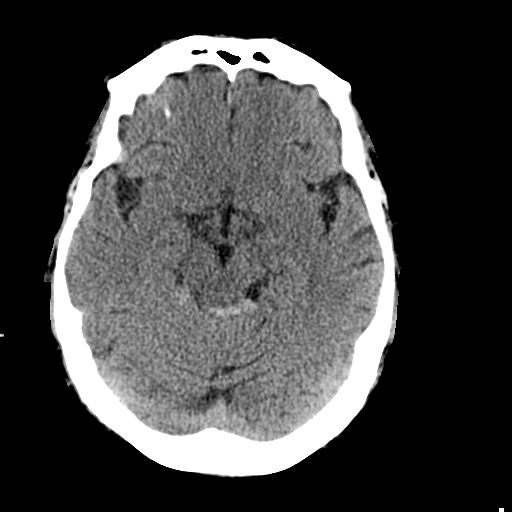
[im 11/36  bone]
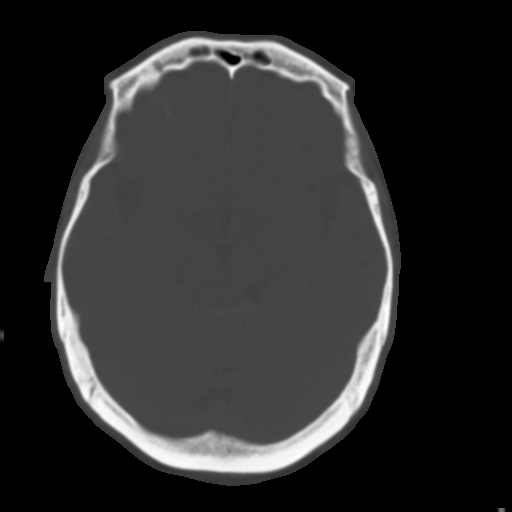
[im 14/36  brain]
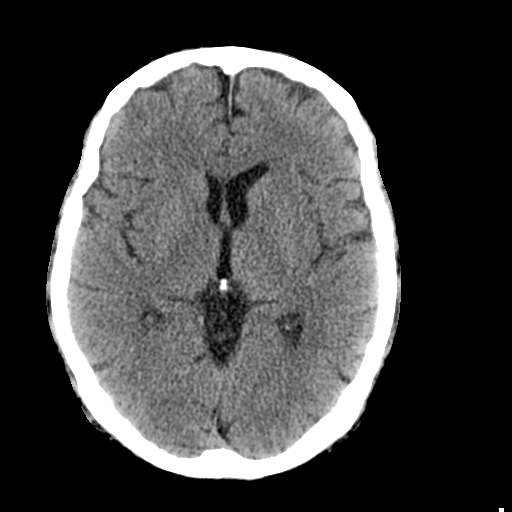
[im 16/36  brain]
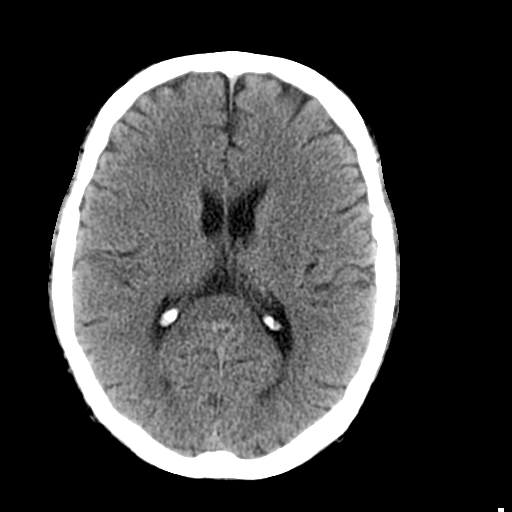
[im 19/36  brain]
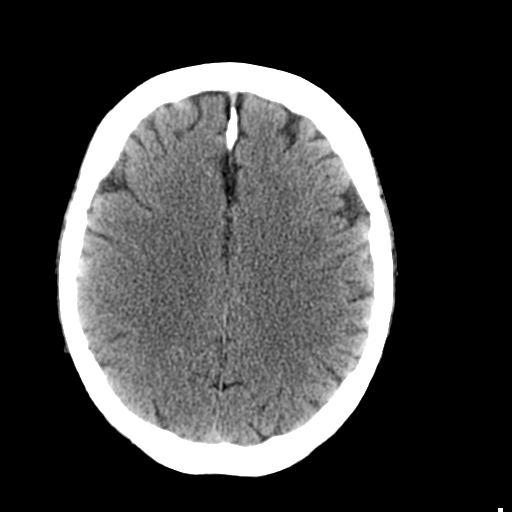
[im 20/36  brain]
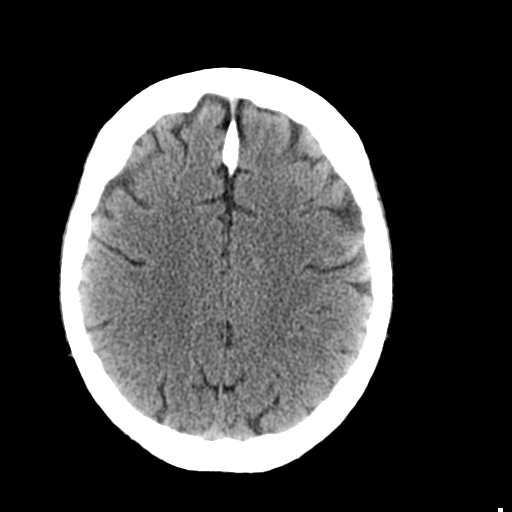
[im 20/36  bone]
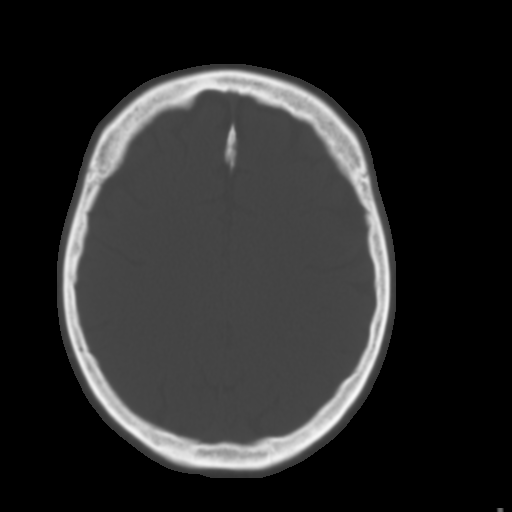
[im 22/36  brain]
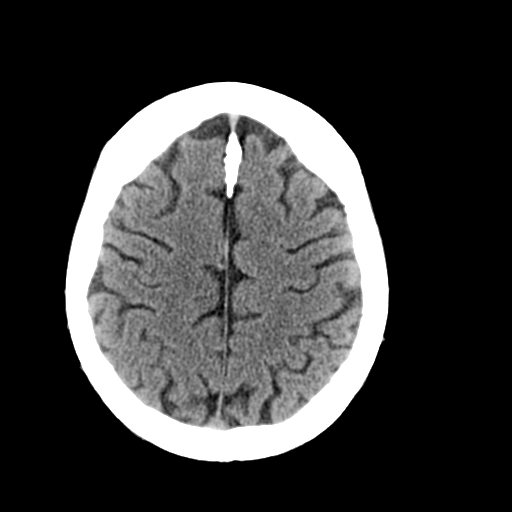
[im 25/36  brain]
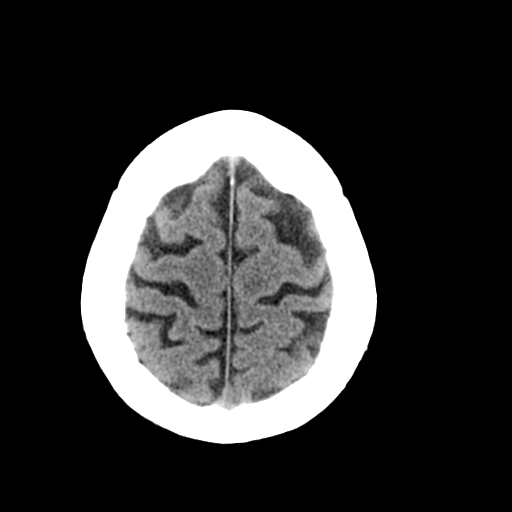
[im 27/36  brain]
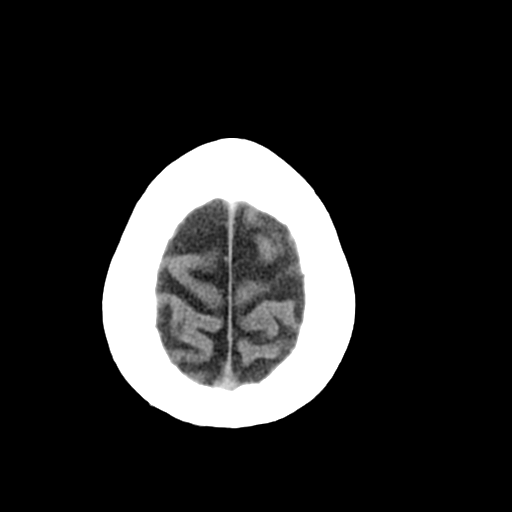
[im 29/36  brain]
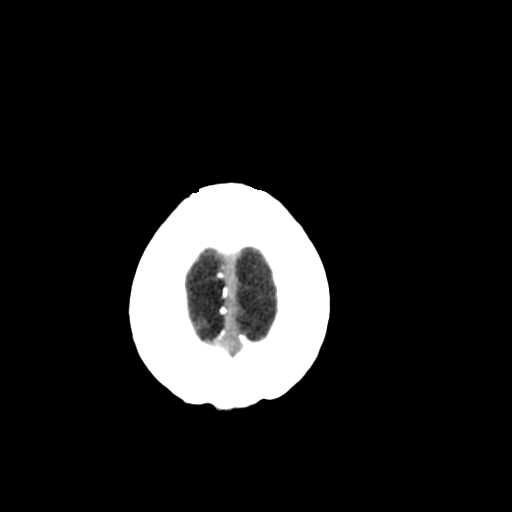
[im 29/36  bone]
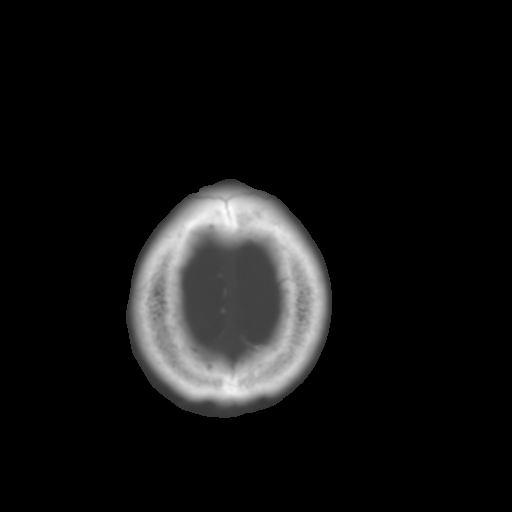
[im 32/36  brain]
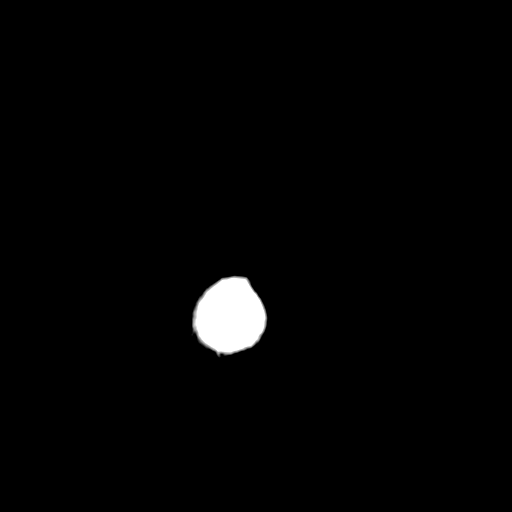
[im 34/36  brain]
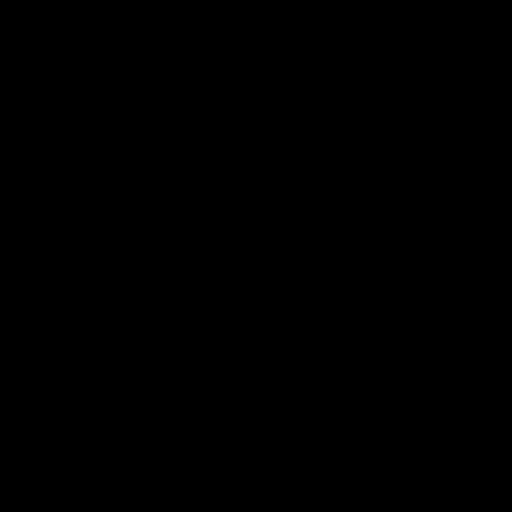

[15 of 30 positions shown; findings below may reference images not displayed]

FINDINGS: The ventricles and sulci are normal for age. No intraparenchymal
hemorrhage, mass effect nor midline shift. Mild supratentorial white
matter hypodensities are within normal range for patient's age and
though non-specific suggest sequelae of chronic small vessel
ischemic disease. No acute large vascular territory infarcts.

Small amount of dense extra-axial blood products at the level of the
quadrigeminal and ambient cistern (axial 11/36). Basal cisterns are
patent. Severe calcific atherosclerosis of the carotid siphons and
included vertebral arteries.

No skull fracture. The included ocular globes and orbital contents
are non-suspicious. The mastoid aircells and included paranasal
sinuses are well-aerated.
IMPRESSION: Small amount of blood products in the quadrigeminal/ambient cistern
; this can be seen with perimesencephalic subarachnoid hemorrhage
(typically not aneurysmal).

Mild white matter changes can be seen with chronic small vessel
ischemic disease.

Acute findings discussed with and reconfirmed by Dr.GINDA DATSUN
on 12/24/2014 at [DATE].

  By: Tanika Hanson

## 2016-05-04 IMAGING — DX DG CHEST 2V
2 series · 2 of 2 positions shown · non-contrast
Comparison: 11/19/2014

CLINICAL DATA: Fall

EXAM:
CHEST  2 VIEW

[chest pa]
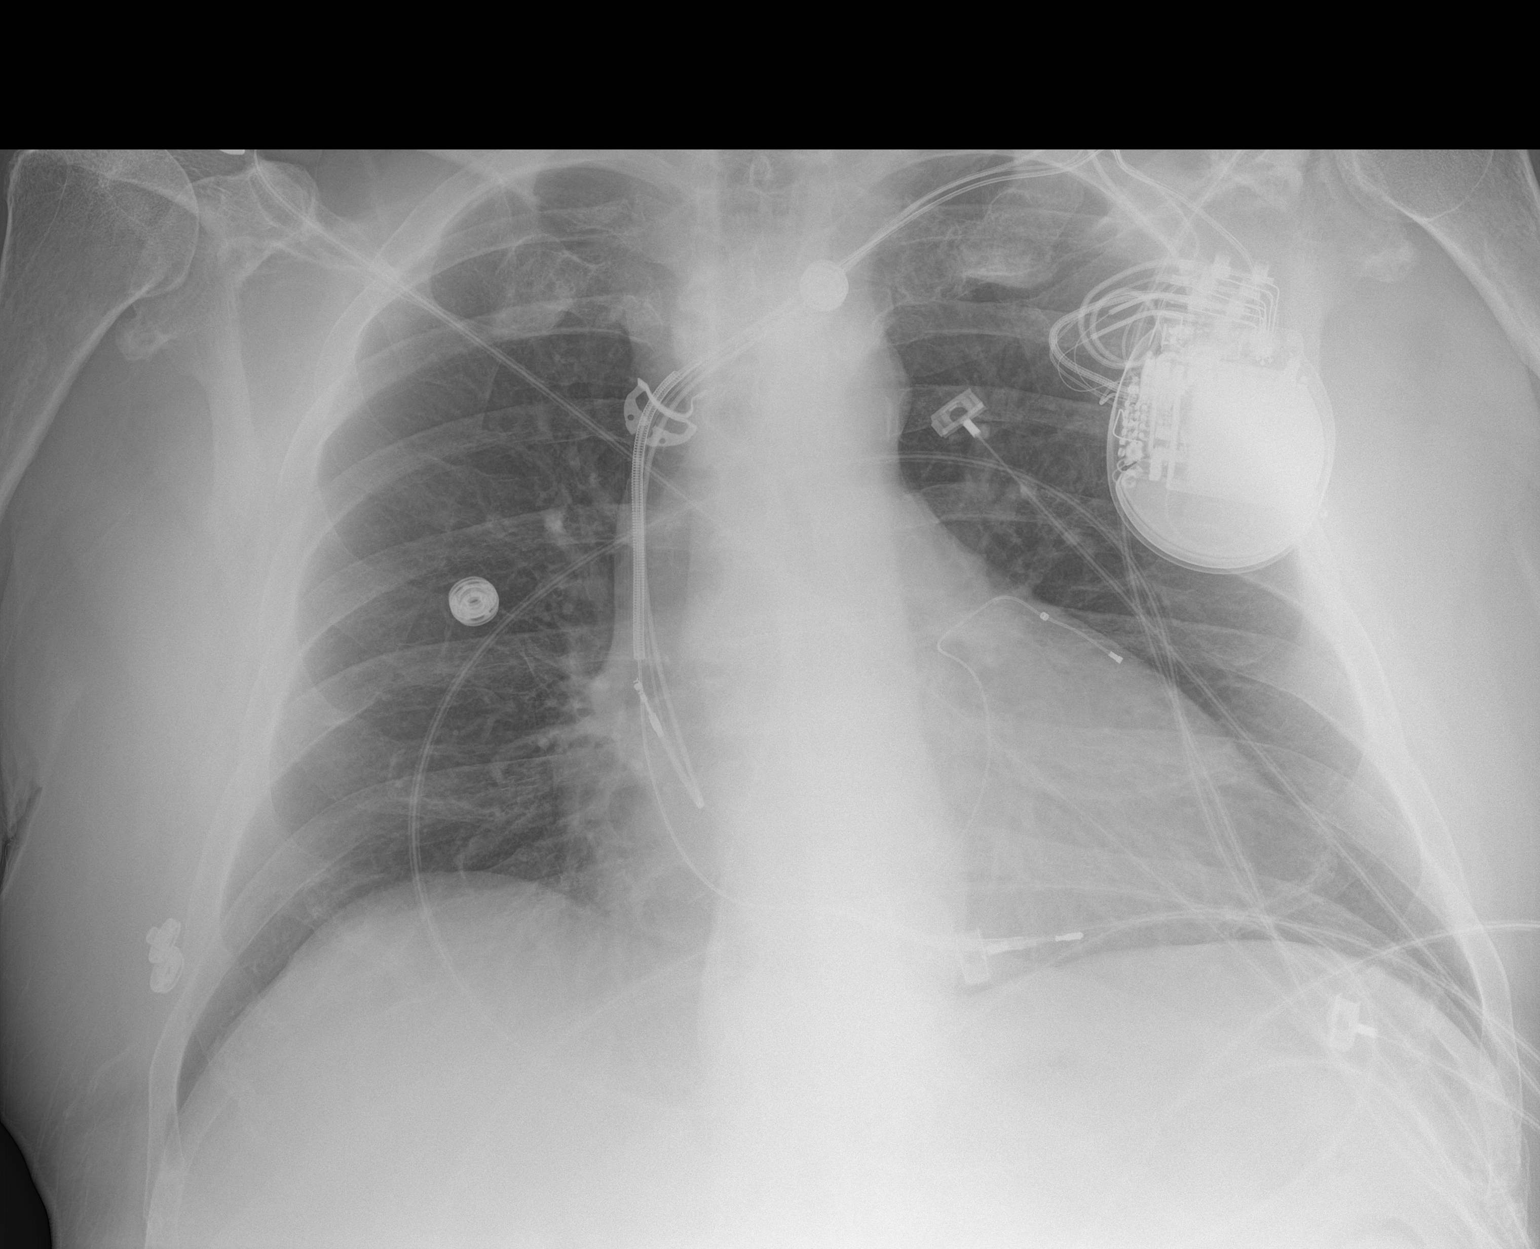

[chest lat]
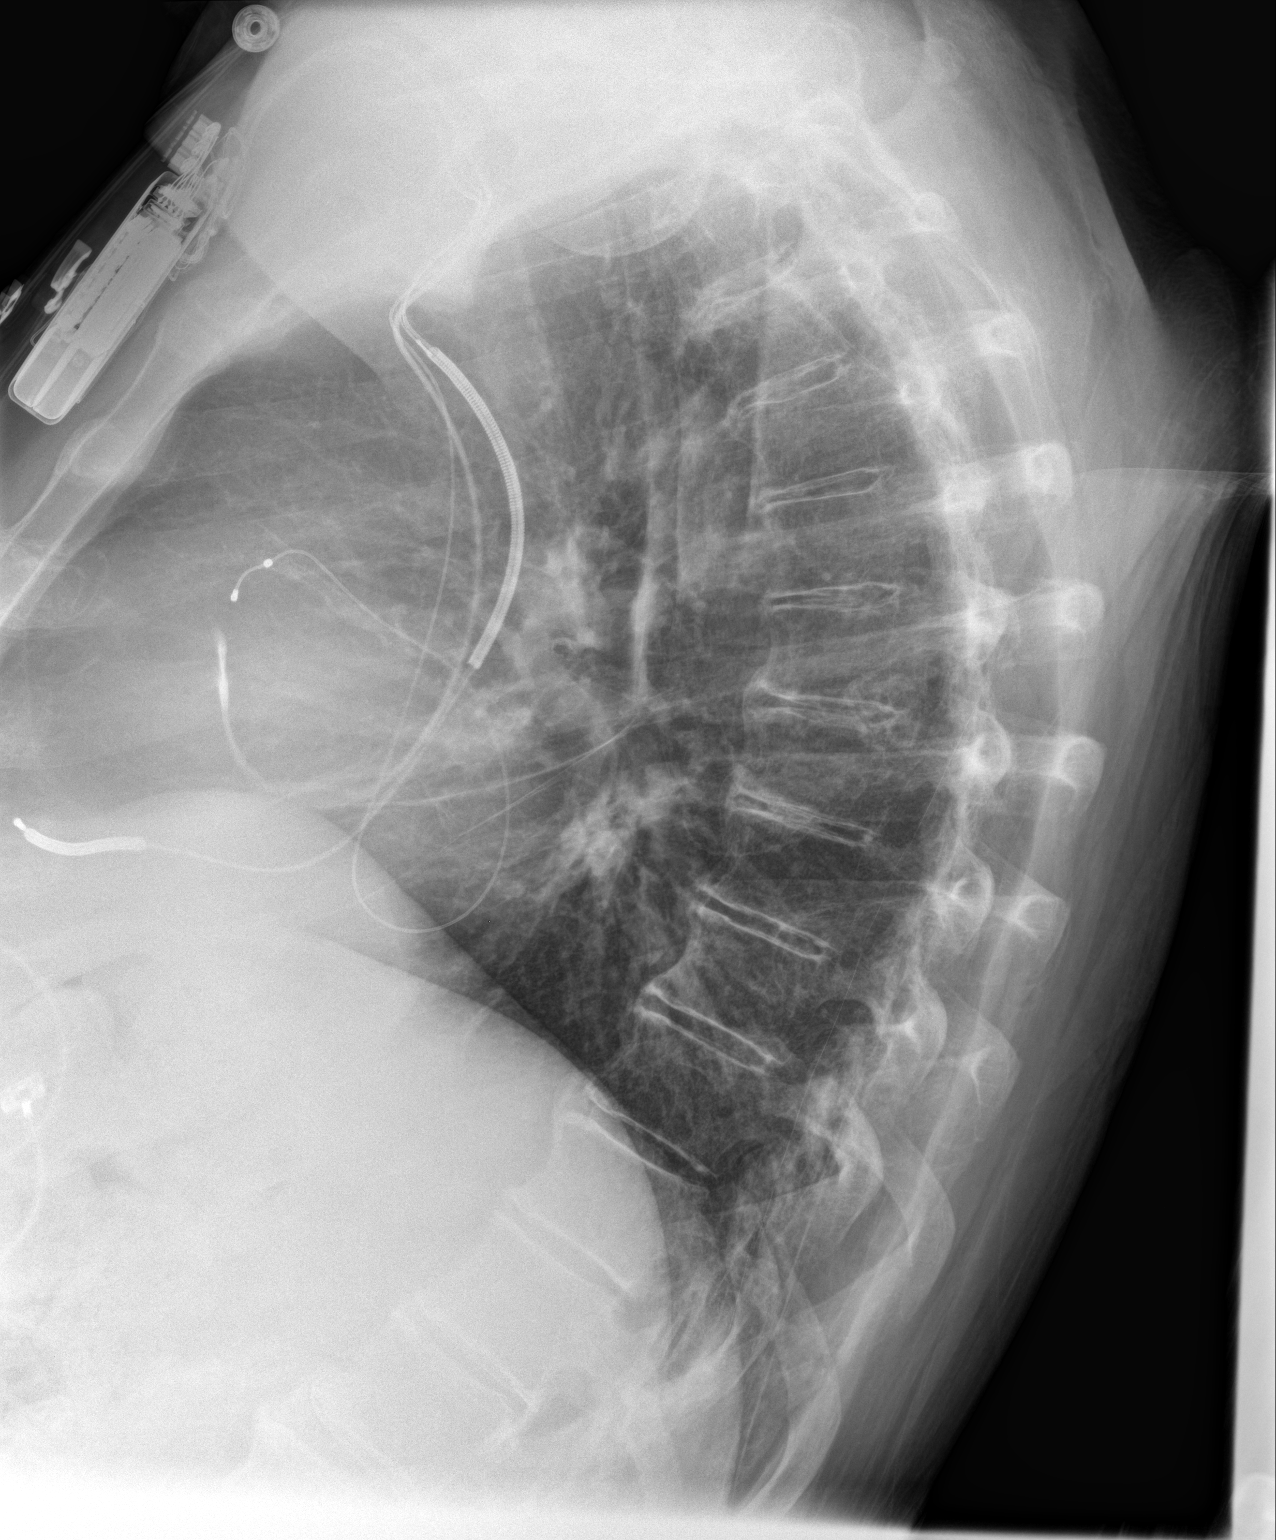

[2 of 2 positions shown; findings below may reference images not displayed]

FINDINGS: Biventricular ICD/pacer leads are in unchanged position. Unchanged
cardiomegaly. Aortic and hilar contours are normal. Previously noted
CHF has resolved. No pneumothorax. No evidence of fracture.
IMPRESSION: No active cardiopulmonary disease.

## 2016-05-05 ENCOUNTER — Encounter (HOSPITAL_COMMUNITY): Payer: Self-pay

## 2016-05-05 ENCOUNTER — Ambulatory Visit (HOSPITAL_COMMUNITY)
Admission: RE | Admit: 2016-05-05 | Discharge: 2016-05-05 | Disposition: A | Discharge status: Home / Self Care | Payer: Medicare Other | Source: Ambulatory Visit | Attending: Nurse Practitioner | Admitting: Nurse Practitioner

## 2016-05-05 DIAGNOSIS — I253 Aneurysm of heart: Secondary | ICD-10-CM | POA: Insufficient documentation

## 2016-05-05 DIAGNOSIS — N200 Calculus of kidney: Secondary | ICD-10-CM | POA: Insufficient documentation

## 2016-05-05 DIAGNOSIS — I2510 Atherosclerotic heart disease of native coronary artery without angina pectoris: Secondary | ICD-10-CM | POA: Insufficient documentation

## 2016-05-05 DIAGNOSIS — Z9889 Other specified postprocedural states: Secondary | ICD-10-CM | POA: Diagnosis not present

## 2016-05-05 DIAGNOSIS — Z85048 Personal history of other malignant neoplasm of rectum, rectosigmoid junction, and anus: Secondary | ICD-10-CM | POA: Insufficient documentation

## 2016-05-05 DIAGNOSIS — Z923 Personal history of irradiation: Secondary | ICD-10-CM | POA: Insufficient documentation

## 2016-05-05 IMAGING — CT CT HEAD W/O CM
1 series · 16 of 30 positions shown, 20 images · non-contrast
Comparison: December 24, 1914

CLINICAL DATA: Subsequent evaluation of subarachnoid hemorrhage,
with headache and dizziness

EXAM:
CT HEAD WITHOUT CONTRAST
TECHNIQUE: Contiguous axial images were obtained from the base of the skull
through the vertex without intravenous contrast.

[Series 2: headseq 4.8 h37s · axial · 0.43mm/px · z∈[+82,+234]mm · 16 of 36 slices shown, 20 images]
[im 2/36  brain]
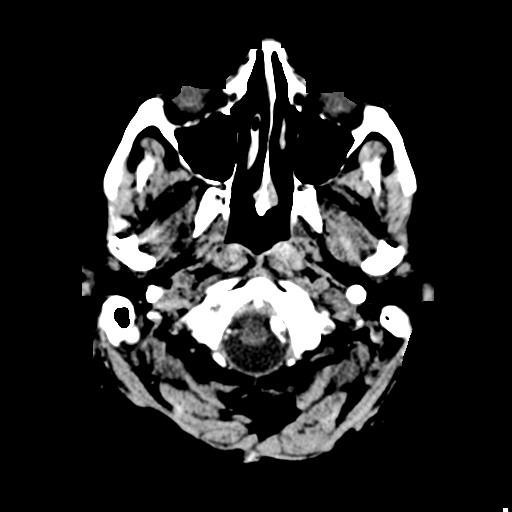
[im 2/36  bone]
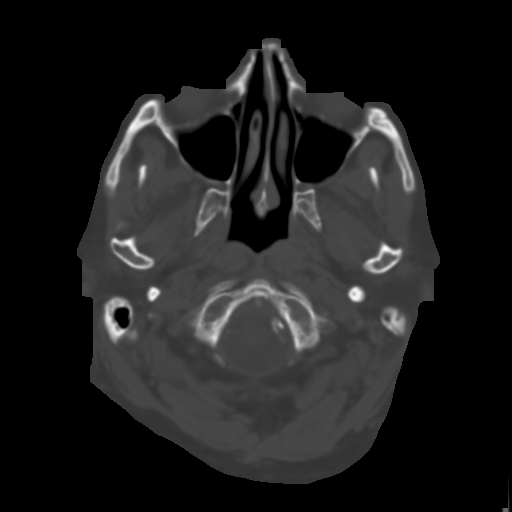
[im 4/36  brain]
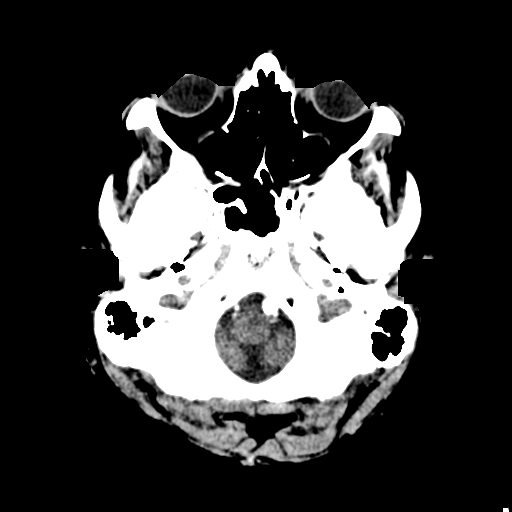
[im 7/36  brain]
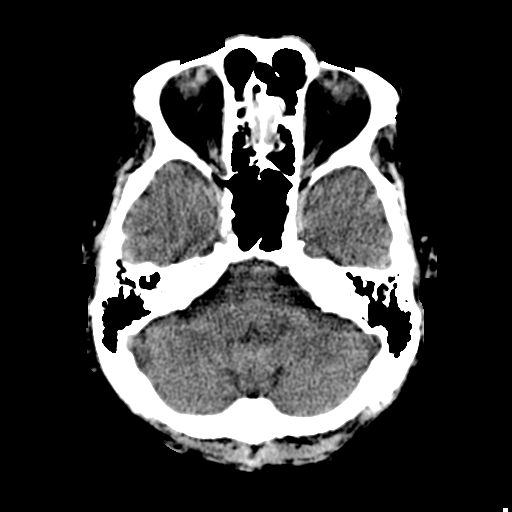
[im 9/36  brain]
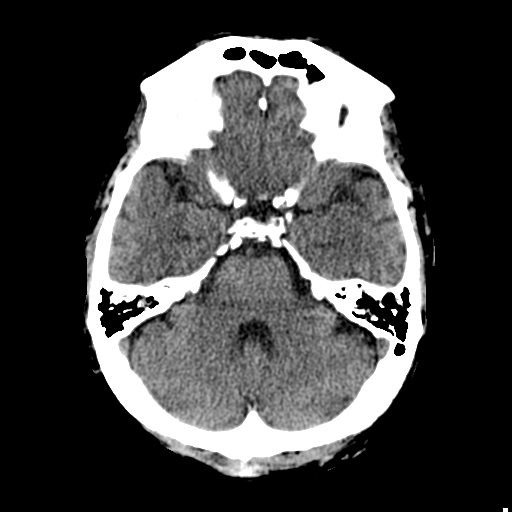
[im 10/36  brain]
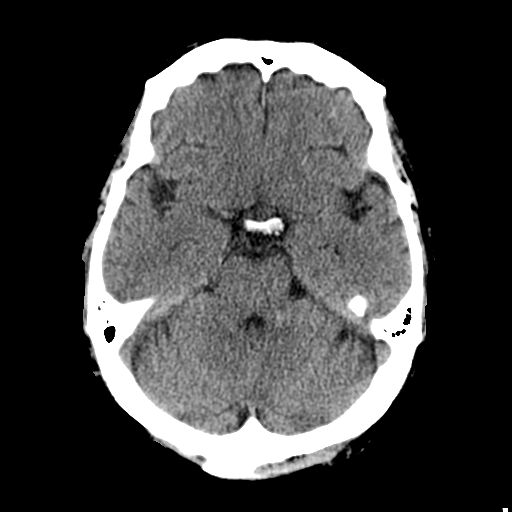
[im 10/36  bone]
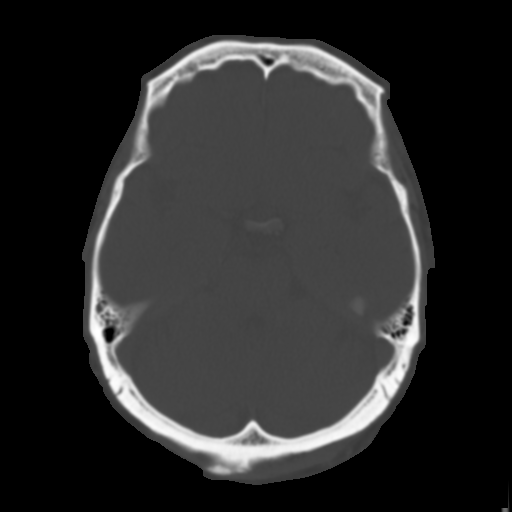
[im 13/36  brain]
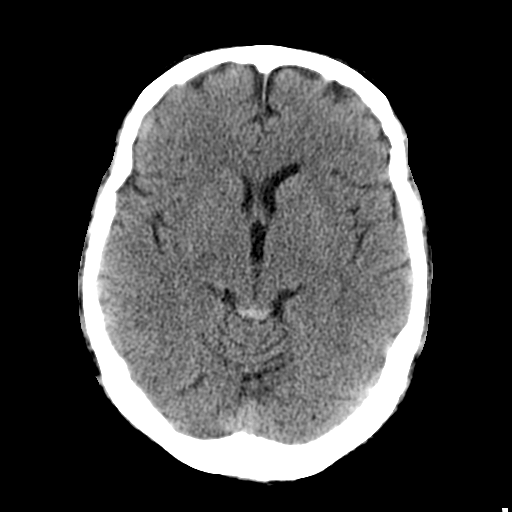
[im 15/36  brain]
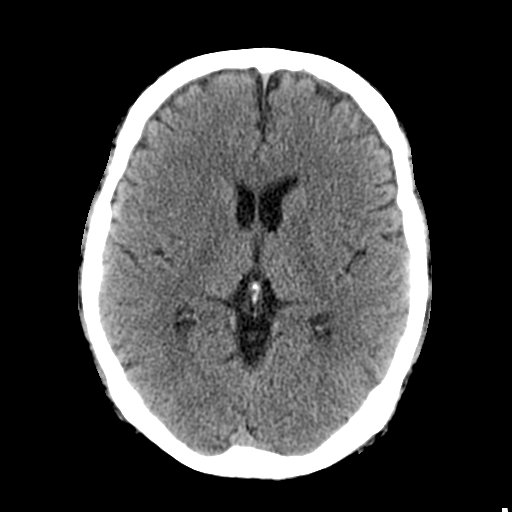
[im 17/36  brain]
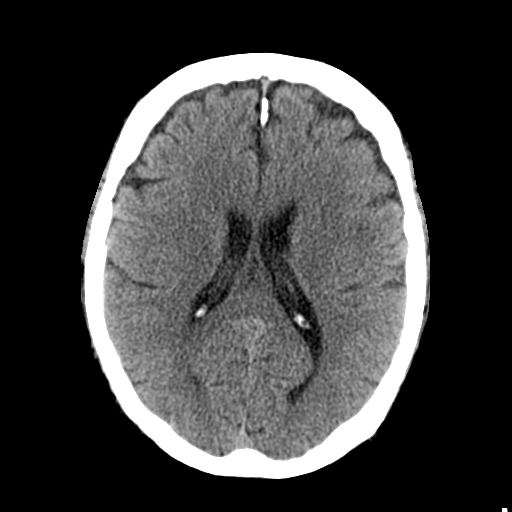
[im 19/36  brain]
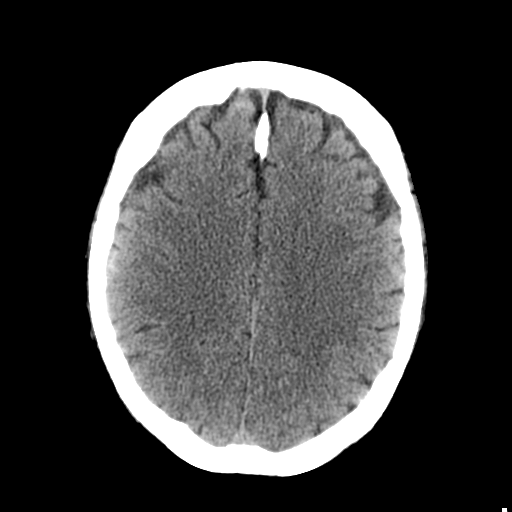
[im 19/36  bone]
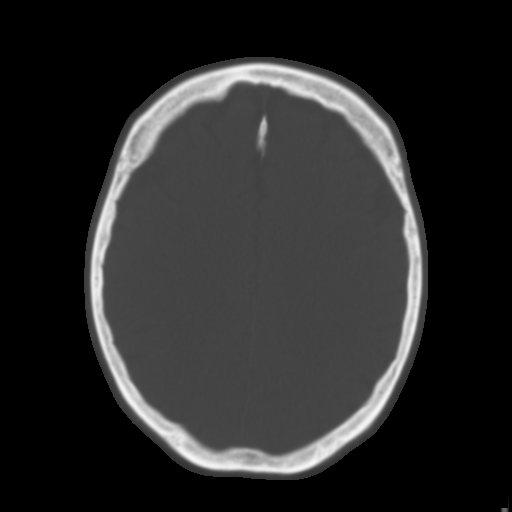
[im 21/36  brain]
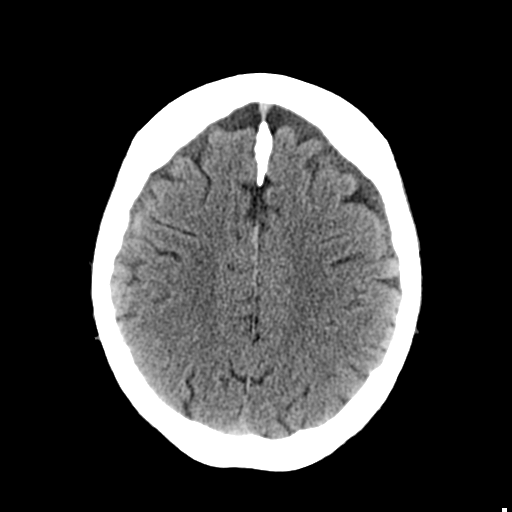
[im 23/36  brain]
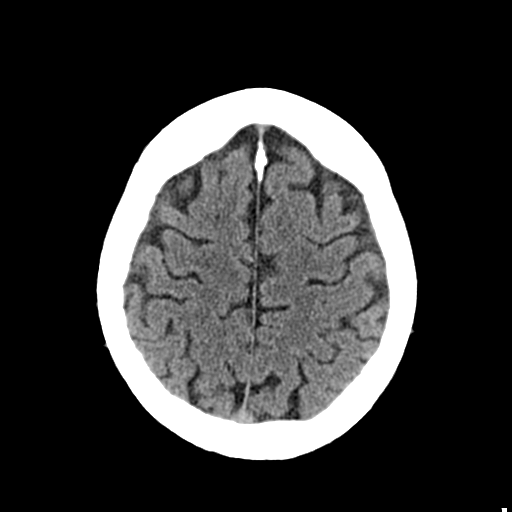
[im 26/36  brain]
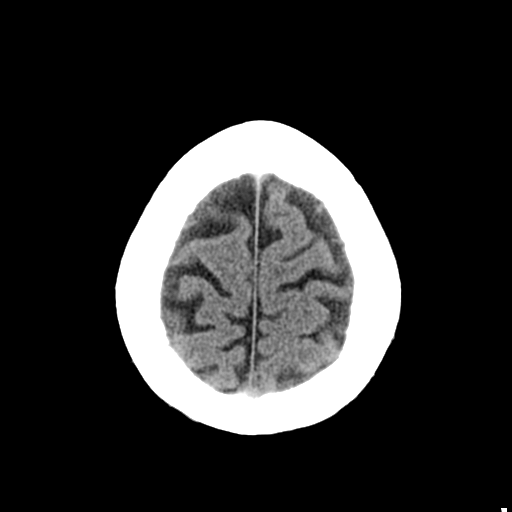
[im 27/36  brain]
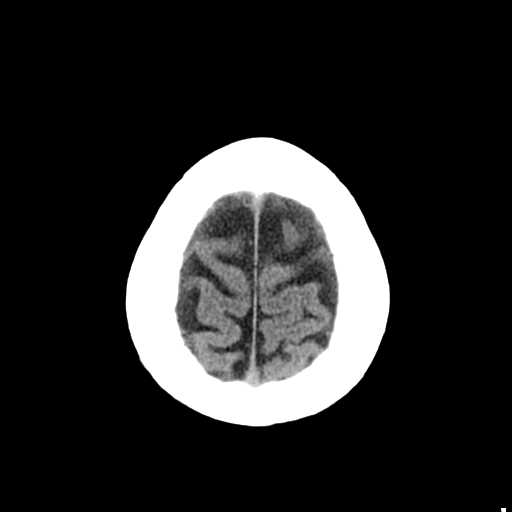
[im 27/36  bone]
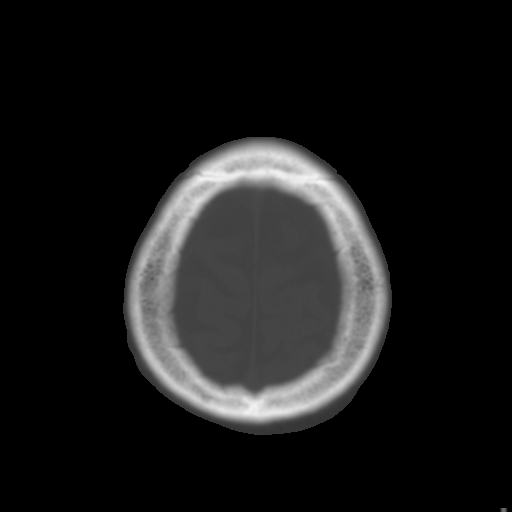
[im 29/36  brain]
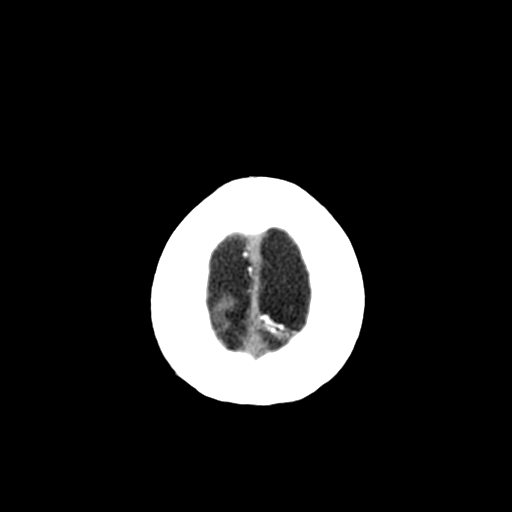
[im 32/36  brain]
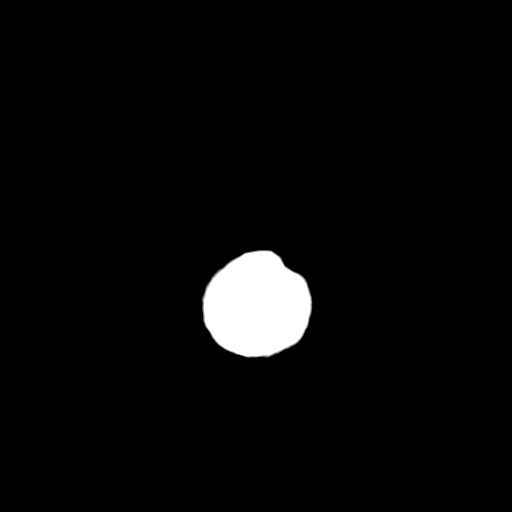
[im 34/36  brain]
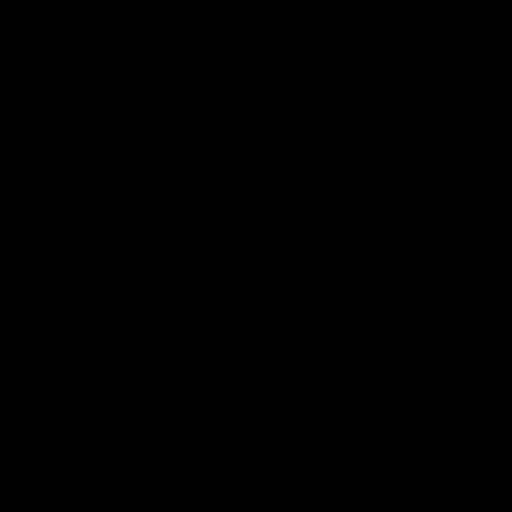

[16 of 30 positions shown; findings below may reference images not displayed]

FINDINGS: Calvarium is intact.  Visualized sinuses clear.

No evidence of parenchymal hemorrhage. No evidence of mass or
vascular territory infarct. No hydrocephalus. Vertebrobasilar
calcification again noted. Again identified is hyperattenuation in
the quadrigeminal and ambient cisterns. When compared to prior study
there is no interval change.
IMPRESSION: No change when compared to prior study with small volume extra-axial
fluid again seen in the quadrigeminal and ambient cisterns.

## 2016-05-06 ENCOUNTER — Telehealth: Payer: Self-pay | Admitting: *Deleted

## 2016-05-06 IMAGING — CR DG ABD PORTABLE 1V
1 series · 1 of 1 positions shown · non-contrast
Comparison: CT 05/20/2014.

CLINICAL DATA: Abdominal pain.

EXAM:
PORTABLE ABDOMEN - 1 VIEW

[supine ap]
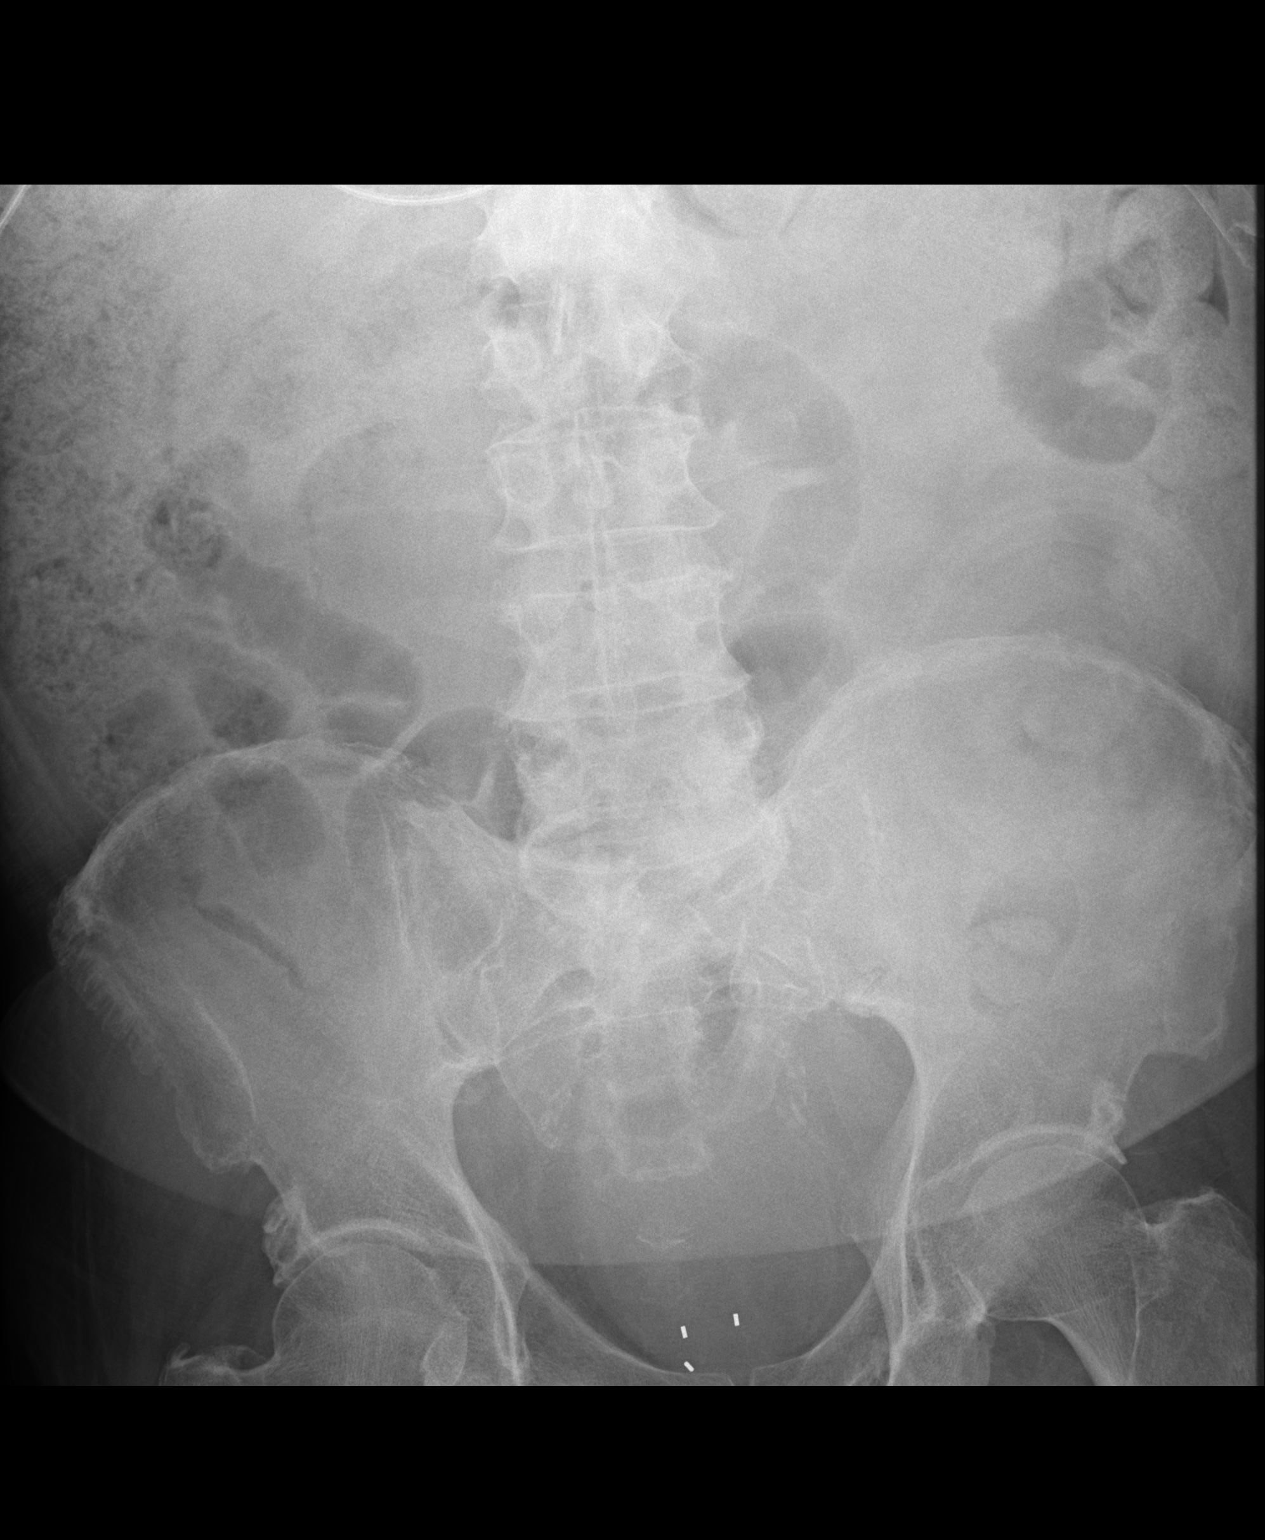

[1 of 1 positions shown; findings below may reference images not displayed]

FINDINGS: Soft tissue structures are unremarkable. Gas pattern is nonspecific.
No free air. Large amount of stool in colon P Ostomy noted over the
left abdomen. Vascular calcification. Degenerative changes noted in
the lumbar spine and both hips. Pelvic surgical clips.
IMPRESSION: Large amount of stool in colon. Constipation cannot be excluded. No
bowel distention.

## 2016-05-06 NOTE — Telephone Encounter (Signed)
Per Dr. Benay Spice, pt notified that ct shows no evidence of recurrent cancer and to f/u as scheduled.  Pt appreciative of call and has no questions or concerns at this time.

## 2016-05-06 NOTE — Telephone Encounter (Signed)
-----   Message from Robert Pier, MD sent at 05/05/2016  8:21 PM EDT ----- Please call patient, ct shows no evidence of recurrent cancer, f/u as scheduled

## 2016-05-07 ENCOUNTER — Ambulatory Visit (HOSPITAL_BASED_OUTPATIENT_CLINIC_OR_DEPARTMENT_OTHER)
Admission: RE | Admit: 2016-05-07 | Discharge: 2016-05-07 | Disposition: A | Discharge status: Home / Self Care | Payer: Medicare Other | Source: Ambulatory Visit | Attending: Internal Medicine | Admitting: Internal Medicine

## 2016-05-07 ENCOUNTER — Encounter (HOSPITAL_COMMUNITY): Payer: Self-pay | Admitting: Internal Medicine

## 2016-05-07 VITALS — BP 104/60 | HR 98 | Ht 70.0 in | Wt 193.8 lb

## 2016-05-07 DIAGNOSIS — I255 Ischemic cardiomyopathy: Secondary | ICD-10-CM | POA: Insufficient documentation

## 2016-05-07 DIAGNOSIS — Z8546 Personal history of malignant neoplasm of prostate: Secondary | ICD-10-CM

## 2016-05-07 DIAGNOSIS — I5022 Chronic systolic (congestive) heart failure: Secondary | ICD-10-CM

## 2016-05-07 DIAGNOSIS — Z8673 Personal history of transient ischemic attack (TIA), and cerebral infarction without residual deficits: Secondary | ICD-10-CM

## 2016-05-07 DIAGNOSIS — I4892 Unspecified atrial flutter: Secondary | ICD-10-CM | POA: Diagnosis not present

## 2016-05-07 DIAGNOSIS — Z9221 Personal history of antineoplastic chemotherapy: Secondary | ICD-10-CM | POA: Insufficient documentation

## 2016-05-07 DIAGNOSIS — I482 Chronic atrial fibrillation, unspecified: Secondary | ICD-10-CM

## 2016-05-07 DIAGNOSIS — K2100 Gastro-esophageal reflux disease with esophagitis, without bleeding: Secondary | ICD-10-CM

## 2016-05-07 DIAGNOSIS — Z955 Presence of coronary angioplasty implant and graft: Secondary | ICD-10-CM | POA: Insufficient documentation

## 2016-05-07 DIAGNOSIS — Z8249 Family history of ischemic heart disease and other diseases of the circulatory system: Secondary | ICD-10-CM

## 2016-05-07 DIAGNOSIS — E785 Hyperlipidemia, unspecified: Secondary | ICD-10-CM

## 2016-05-07 DIAGNOSIS — I9589 Other hypotension: Secondary | ICD-10-CM | POA: Diagnosis not present

## 2016-05-07 DIAGNOSIS — N184 Chronic kidney disease, stage 4 (severe): Secondary | ICD-10-CM | POA: Diagnosis not present

## 2016-05-07 DIAGNOSIS — K8020 Calculus of gallbladder without cholecystitis without obstruction: Secondary | ICD-10-CM | POA: Diagnosis not present

## 2016-05-07 DIAGNOSIS — G4733 Obstructive sleep apnea (adult) (pediatric): Secondary | ICD-10-CM

## 2016-05-07 DIAGNOSIS — E1122 Type 2 diabetes mellitus with diabetic chronic kidney disease: Secondary | ICD-10-CM | POA: Insufficient documentation

## 2016-05-07 DIAGNOSIS — Z95810 Presence of automatic (implantable) cardiac defibrillator: Secondary | ICD-10-CM

## 2016-05-07 DIAGNOSIS — I252 Old myocardial infarction: Secondary | ICD-10-CM

## 2016-05-07 DIAGNOSIS — Z85048 Personal history of other malignant neoplasm of rectum, rectosigmoid junction, and anus: Secondary | ICD-10-CM

## 2016-05-07 DIAGNOSIS — Z933 Colostomy status: Secondary | ICD-10-CM

## 2016-05-07 DIAGNOSIS — K8012 Calculus of gallbladder with acute and chronic cholecystitis without obstruction: Secondary | ICD-10-CM | POA: Diagnosis not present

## 2016-05-07 DIAGNOSIS — Z7901 Long term (current) use of anticoagulants: Secondary | ICD-10-CM | POA: Insufficient documentation

## 2016-05-07 DIAGNOSIS — I130 Hypertensive heart and chronic kidney disease with heart failure and stage 1 through stage 4 chronic kidney disease, or unspecified chronic kidney disease: Secondary | ICD-10-CM | POA: Insufficient documentation

## 2016-05-07 DIAGNOSIS — R0789 Other chest pain: Secondary | ICD-10-CM | POA: Diagnosis not present

## 2016-05-07 DIAGNOSIS — E669 Obesity, unspecified: Secondary | ICD-10-CM

## 2016-05-07 DIAGNOSIS — Z923 Personal history of irradiation: Secondary | ICD-10-CM | POA: Insufficient documentation

## 2016-05-07 DIAGNOSIS — I2510 Atherosclerotic heart disease of native coronary artery without angina pectoris: Secondary | ICD-10-CM

## 2016-05-07 DIAGNOSIS — R079 Chest pain, unspecified: Secondary | ICD-10-CM | POA: Diagnosis not present

## 2016-05-07 DIAGNOSIS — Z79899 Other long term (current) drug therapy: Secondary | ICD-10-CM | POA: Insufficient documentation

## 2016-05-07 DIAGNOSIS — K219 Gastro-esophageal reflux disease without esophagitis: Secondary | ICD-10-CM | POA: Insufficient documentation

## 2016-05-07 DIAGNOSIS — E1165 Type 2 diabetes mellitus with hyperglycemia: Secondary | ICD-10-CM

## 2016-05-07 LAB — BASIC METABOLIC PANEL
Anion gap: 9 (ref 5–15)
BUN: 40 mg/dL — AB (ref 6–20)
CHLORIDE: 99 mmol/L — AB (ref 101–111)
CO2: 28 mmol/L (ref 22–32)
Calcium: 9.3 mg/dL (ref 8.9–10.3)
Creatinine, Ser: 2.59 mg/dL — ABNORMAL HIGH (ref 0.61–1.24)
GFR calc Af Amer: 29 mL/min — ABNORMAL LOW (ref >60)
GFR calc non Af Amer: 25 mL/min — ABNORMAL LOW (ref >60)
Glucose, Bld: 280 mg/dL — ABNORMAL HIGH (ref 65–99)
POTASSIUM: 3.7 mmol/L (ref 3.5–5.1)
SODIUM: 136 mmol/L (ref 135–145)

## 2016-05-07 MED ORDER — PANTOPRAZOLE SODIUM 40 MG PO TBEC: 40.0000 mg | DELAYED_RELEASE_TABLET | Freq: Every day | ORAL | Status: DC

## 2016-05-07 NOTE — Progress Notes (Signed)
Patient ID: OVA VOTH, male   DOB: 04-Nov-1956, 60 y.o.   MRN: [id number]    ADVANCED HF CLINIC NOTE  PCP: Dr. Gerarda Fraction Oncologist: Dr Benay Spice.  CHF: B[place]imhon  60 yo with history of CAD and ischemic cardiomyopathy EF 20-25%, BiV ICD upgrade 2009, chronic atrial fibrillation s/p AVN ablation 9/16, CKD, and traumatic SAH in 1/16 after a fall.  He has been followed at the heart failure clinic at [facility] by Dr Carolynn Serve in the past.    He was admitted in 3/16 from the [facility] ER with exertional dyspnea/volume overload and had a cardiac arrest/ventricular fibrillation while in the hospital terminated by his ICD.  After diuresis, RHC showed relatively normal filling pressures and preserved cardiac index.  Creatinine peaked at 3.0. (was 5.0 in 1/16).  Echo in 3/16 showed EF 25-30% with restrictive diastolic function.  Cardiolite was done, showing areas of scar but no ischemia.  EF 18%.    Admitted 4/16 with hematuria and elevated LFTs. Questionable amiodarone toxicity and this was stopped.   In 5/16 Underwent elective BiV ICD generator change which went without complication. He was admitted again 04/23/15 with acute on chronic CHF and respiratory failure. During that admission he ruled in for a NSTEMI- Troponin 4.29. He has chronic stage 4 CKD and it was decided to obtain a Myoview before considering a cath. Myoview 04/26/15 showed EF 20% with large region of prior infarct involving the apical anterior, anteroseptal, inferoseptal and inferior walls with extension to the true apex. Small region of reversible/inducible ischemia along the margin of the prior infarct at the mid ventricular anterior and anteroseptal walls. No change from previous.   Admitted to [facility] in 8/16 for increased dyspnea and volume overload. Diuresed with IV lasix and transitioned to 40 mg daily. D/C weight  200 pounds.  Redmitted in 9/16 with bleeding from stoma site and atrial fibrillation with RVR.  Eliquis was held and bleeding  stopped.  Eliquis was restarted prior to discharge. He was noted to have < 50% BiV pacing and also to be very volume overloaded. He was then diuresed and had AV nodal ablation.    Readmitted 10/16 with volume overload and worsening renal failure.. Diuresed 32 pounds. Went home 189 pounds. Switched to torsemide on d/c 80/40  He presents today for regular follow up. Weight down 6 lbs from last visit.  Has been doing well over all.  Denies DOE or chest pain.  Denies edema.  No orthopnea or bendopnea. Weight at home 189 this am, at baseline of ~ 190 lbs. He does have occasional lightheadedness with rapid standing up from bending over for prolonged periods. No bleeding on eliquis.   Corevue: Thoracic impedence had been down but trending back towards dry. No VT/VF. AF >99%   Labs (12/15): LDL 123 Labs (3/16): K 3.8, creatinine 2.78 Labs (03/10/2015): K 4.5 Creatinine 2.49  TSH 4.58, LFTs normal, digoxin 0.8 Labs (04/26/15): K 3.5 creatinine 2.13 Labs (08/04/2015): K 3.3 Creatinine 3.01  Labs (9/16): K 4.1, creatinine 2.7, HCT 30.8 Labs (4/17): K 3.5, creatinine 2.67  PMH: 1. HTN 2. Type II diabetes 3. CAD: s/p BMS LAD in 2001, PTCA ramus and BMS LAD in 2009.  Cardiolite (3/16) with EF 18%, no ischemia, prior anterior, apical and inferior infarction. NSTEMI in 5/16.  Myoview done 04/26/15 showed EF 20% with large region of prior infarct involving the apical anterior, anteroseptal, inferoseptal and inferior walls with extension to the true apex. Small region of reversible/inducible ischemia  along the margin of the prior infarct at the mid ventricular anterior and anteroseptal walls. No change from previous.  4. Chronic systolic CHF: Ischemic CMP.  St Jude CRT-D.  Echo (3/16) with EF 25-30%, restrictive diastolic function, mild LVH, mild MR.  RHC (3/16) with mean RA 9, PA 47/29, mean PCWP 16, CI 2.5 (Fick).  Suspect ACEI cough. Echo (5/16) with EF 35%.   5. SAH: 1/16 after fall (traumatic).   6. GERD 7.  Atrial fibrillation: Paroxysmal initially but now chronic.  Now s/p AV nodal ablation to allow BiV pacing. 8. Rectal cancer: s/p surgery. Has colostomy.  9. Prostate cancer: s/p chemo/radiation.  10. H/o TIA 11. OSA: On CPAP.  12. Hyperlipidemia 13. CKD stage IV: Followed by Dr Lowanda Foster.  14. Bleeding from stoma.   SH: Married, lives in [place], nonsmoker.   FH: CAD  ROS: All systems reviewed and negative except as per HPI.   Current Outpatient Prescriptions  Medication Sig Dispense Refill  . apixaban (ELIQUIS) 5 MG TABS tablet Take 1 tablet (5 mg total) by mouth 2 (two) times daily. 60 tablet 3  . atorvastatin (LIPITOR) 80 MG tablet Take 1 tablet (80 mg total) by mouth daily. 30 tablet 6  . co-enzyme Q-10 50 MG capsule Take 50 mg by mouth every morning.     . ferrous sulfate 325 (65 FE) MG tablet Take 1 tablet (325 mg total) by mouth 2 (two) times daily with a meal. 60 tablet 0  . fluticasone (FLONASE) 50 MCG/ACT nasal spray Place 1 spray into both nostrils daily as needed for allergies.     . metoprolol succinate (TOPROL-XL) 25 MG 24 hr tablet Take 0.5 tablets (12.5 mg total) by mouth 2 (two) times daily. 45 tablet 3  . Omega-3 Fatty Acids (FISH OIL) 1000 MG CAPS Take 1,000 mg by mouth 2 (two) times daily.    . pantoprazole (PROTONIX) 40 MG tablet Take 1 tablet (40 mg total) by mouth daily. 30 tablet 0  . potassium chloride SA (K-DUR,KLOR-CON) 20 MEQ tablet Take 20 mEq by mouth daily.    Marland Kitchen PROAIR RESPICLICK 123XX123 (90 BASE) MCG/ACT AEPB Inhale 2 puffs into the lungs every 6 (six) hours as needed (for breathing).   0  . torsemide (DEMADEX) 20 MG tablet Take 4 tablets by mouth every morning and 2 tables by mouth every evening 180 tablet 3  . traMADol (ULTRAM) 50 MG tablet Take 1 tablet (50 mg total) by mouth every 6 (six) hours as needed. 15 tablet 0  . metolazone (ZAROXOLYN) 2.5 MG tablet Take 1 tab weekly as needed for swelling (Patient not taking: Reported on 05/07/2016) 12 tablet 0    No current facility-administered medications for this encounter.    Filed Vitals:   05/07/16 0925  BP: 104/60  Pulse: 98  Height: 5\' 10"  (1.778 m)  Weight: 193 lb 12.8 oz (87.907 kg)  SpO2: 99%    Wt Readings from Last 3 Encounters:  05/07/16 193 lb 12.8 oz (87.907 kg)  03/17/16 188 lb (85.276 kg)  03/14/16 189 lb (85.73 kg)    General: NAD Ambulated in the clinic without difficutly.  Neck: JVP 8-9 cm.   no thyromegaly or thyroid nodule.  Lungs: Clear, normal effort. CV: Nondisplaced PMI.  Regular S1S2, 2/6 SEM RUSB.  no edema.  No carotid bruit.  Normal pedal pulses.  Abdomen: Soft, NT, ND, no HSM appreciated. S/p colostomy.  Skin: Intact without lesions or rashes.  Neurologic: Alert and oriented x 3.  Psych: Normal affect. Extremities: No clubbing or cyanosis.  HEENT: Normal.   Assessment/Plan: 1. Chronic systolic CHF: Ischemic cardiomyopathy.  Has been followed for a long time at [facility].  Deemed not transplant or VAD candidate with comorbities and advanced CKD. Has St Jude CRT-D, now s/p AV nodal ablation to promote BiV pacing.  Breathing much better since AV nodal ablation (NYHA class II) - Volume status stable.  - Continue Toprol 25 bid.  - No ACEI, spironolactone, digoxin with advanced CKD.  Will check BMET  - Has not been checking pressure at home. Remains soft here.  Will keep off hydral/NTG for now.   - Given CKD and previous cancer not good VAD or transplant candidate (would need heart/kidney)  2. Atrial fibrillation: Chronic. CHADS2Vasc Score 5. s/p AV nodal ablation - Feels great s/p AV node ablation.  - Did have one episode of stoma bleeding back on Eliquis 5 bid. But none since.  3. CAD:  NSTEMI in 5/16.  - Cardiolite without high ischemic burden. Poor cath candidate with renal failure. - No ASA given Eliquis use.   - Cont statin and Toprol XL.  4. CKD: Stage IV. S/p AVF in RUE.  - BMET today.  - Followed by Dr Lowanda Foster in [place].  5.  DM2: followed  by Dr. Gerarda Fraction 7. H/O rectal cancer 2008: Has diverting colostomy.  He has follow up with Dr Benay Spice. - Recent CT (05/02/16) with no evidence of recurrence. 8. Hyperlipidemia: - Continue statin   Looks great. Continue current regimen. Will give one refill of protonix as he is completely out.  Will need to go to PCP for further.  Check BMET today with Stage IV CKD.  He does have AVF in place if need arises for HD in the future.   Shirley Friar, PA-C  05/07/2016

## 2016-05-07 NOTE — Patient Instructions (Signed)
Labs today  Your physician recommends that you schedule a follow-up appointment in: 4-5 months with Dr. Haroldine Laws  Do the following things EVERYDAY: 1) Weigh yourself in the morning before breakfast. Write it down and keep it in a log. 2) Take your medicines as prescribed 3) Eat low salt foods-Limit salt (sodium) to 2000 mg per day.  4) Stay as active as you can everyday 5) Limit all fluids for the day to less than 2 liters 6)

## 2016-05-07 NOTE — Addendum Note (Signed)
Encounter addended by: Effie Berkshire, RN on: 05/07/2016 11:11 AM<BR>     Documentation filed: Visit Diagnoses, Orders, Dx Association

## 2016-05-08 IMAGING — CT CT HEAD W/O CM
1 series · 16 of 30 positions shown, 20 images · non-contrast
Comparison: Head CT 12/25/2014 and 12/24/2014.

CLINICAL DATA: Recent fall with head injury and subarachnoid
hemorrhage. History of diabetes, atrial fibrillation, chronic kidney
disease and AICD. History of rectal and prostate cancer. Subsequent
encounter.

EXAM:
CT HEAD WITHOUT CONTRAST
TECHNIQUE: Contiguous axial images were obtained from the base of the skull
through the vertex without intravenous contrast.

[Series 3: headtrauma 4.8 h37s · axial · 0.47mm/px · z∈[+94,+247]mm · 16 of 35 slices shown, 20 images]
[im 2/35  brain]
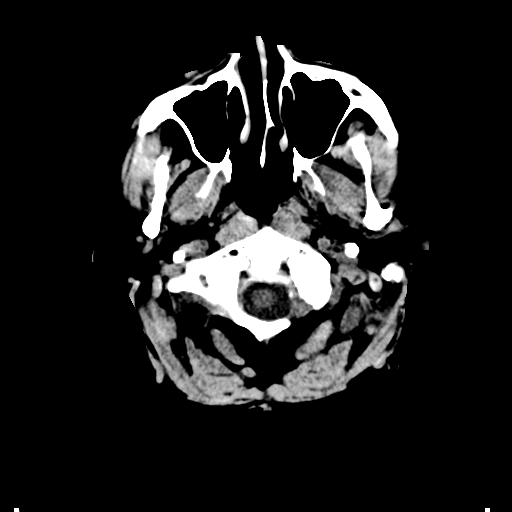
[im 2/35  bone]
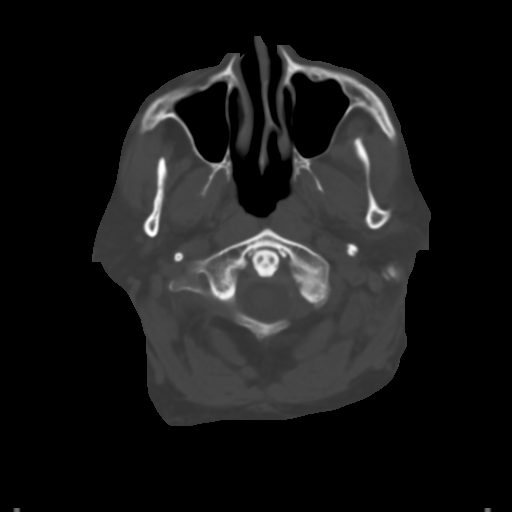
[im 4/35  brain]
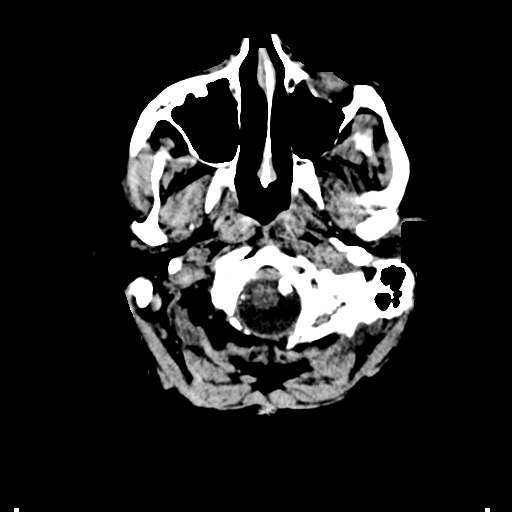
[im 6/35  brain]
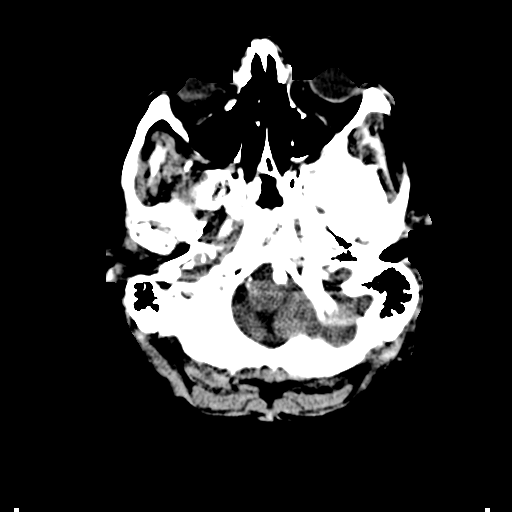
[im 9/35  brain]
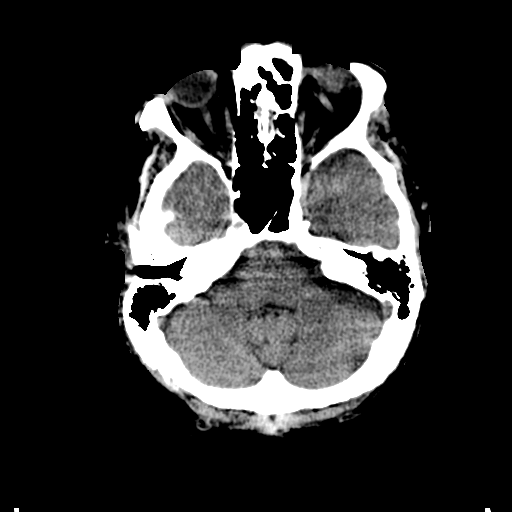
[im 10/35  brain]
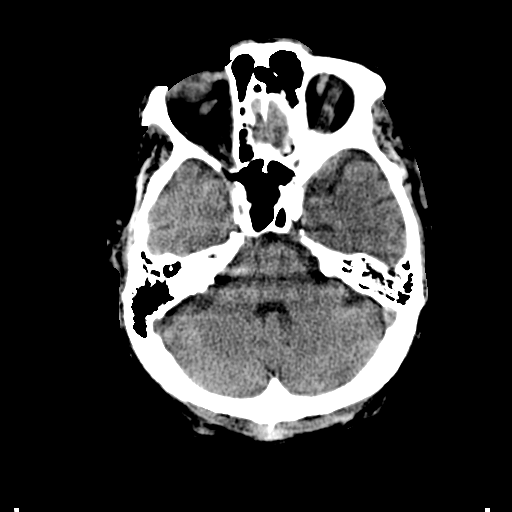
[im 10/35  bone]
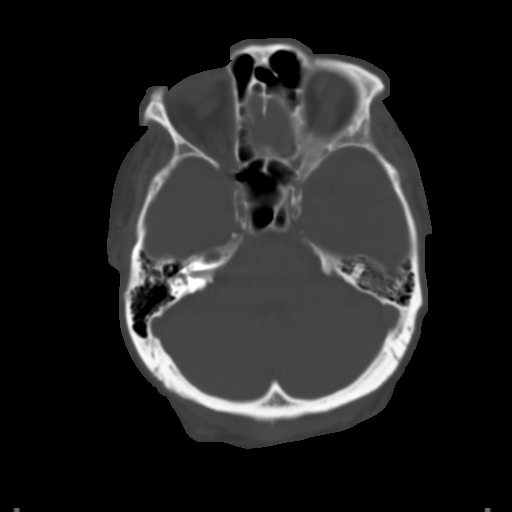
[im 12/35  brain]
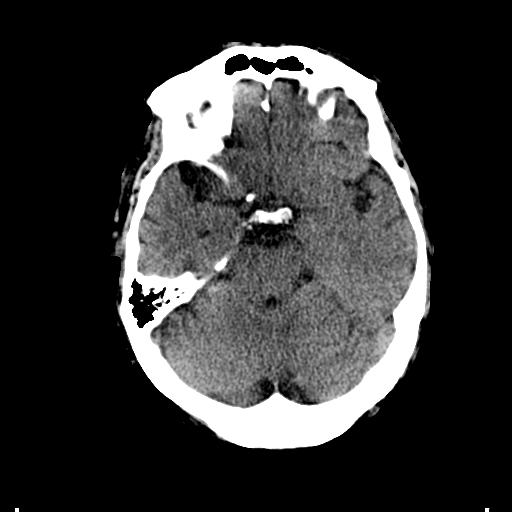
[im 15/35  brain]
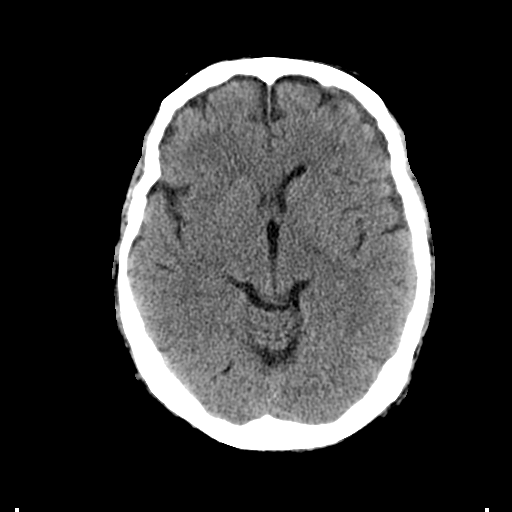
[im 17/35  brain]
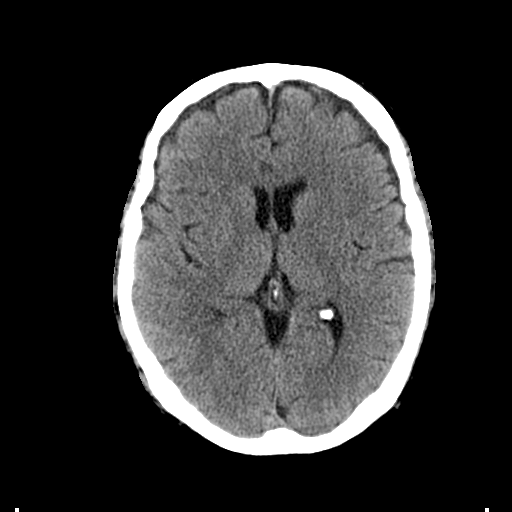
[im 18/35  brain]
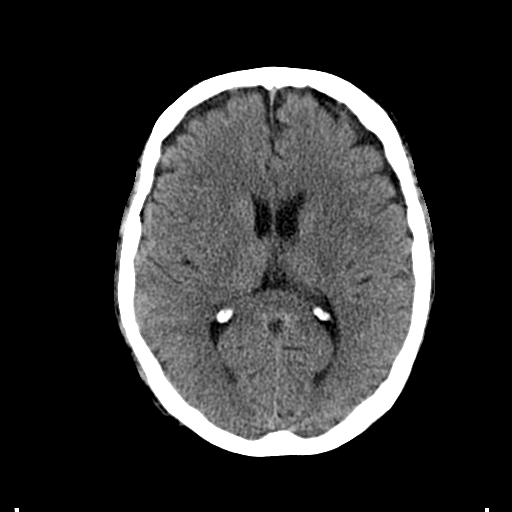
[im 18/35  bone]
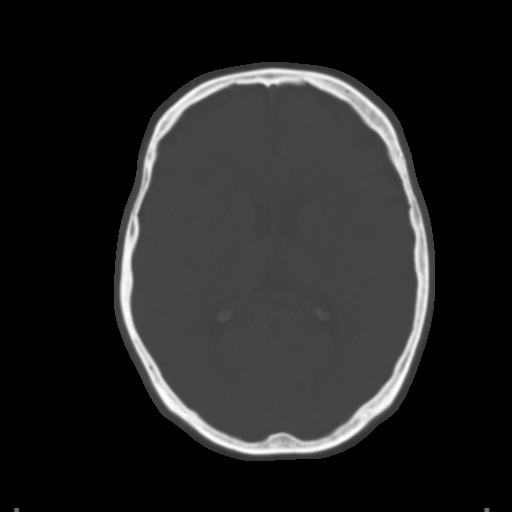
[im 20/35  brain]
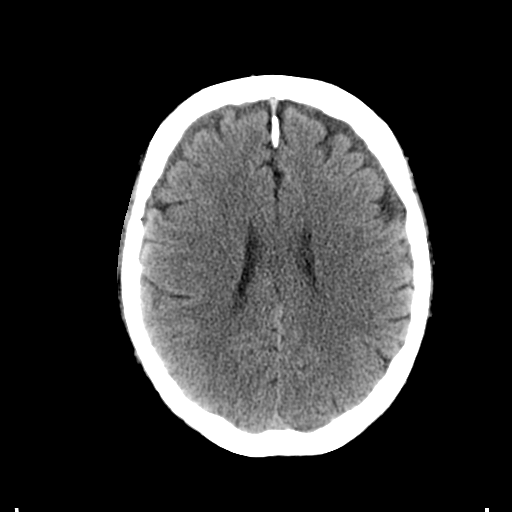
[im 23/35  brain]
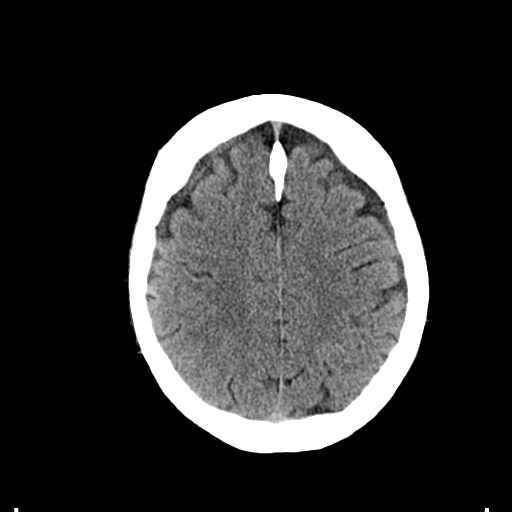
[im 25/35  brain]
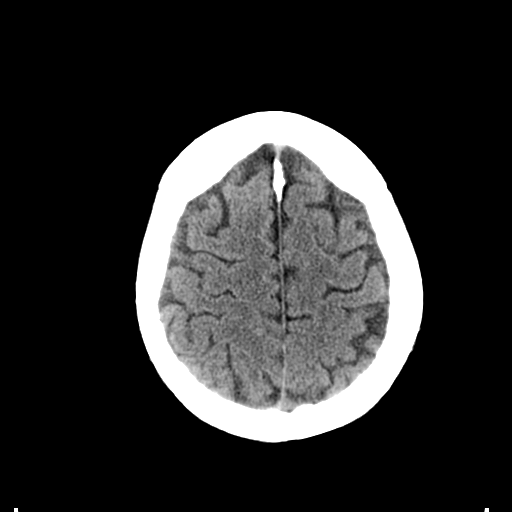
[im 26/35  brain]
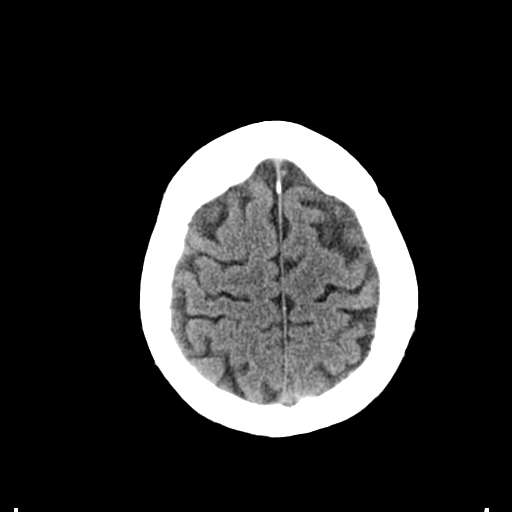
[im 26/35  bone]
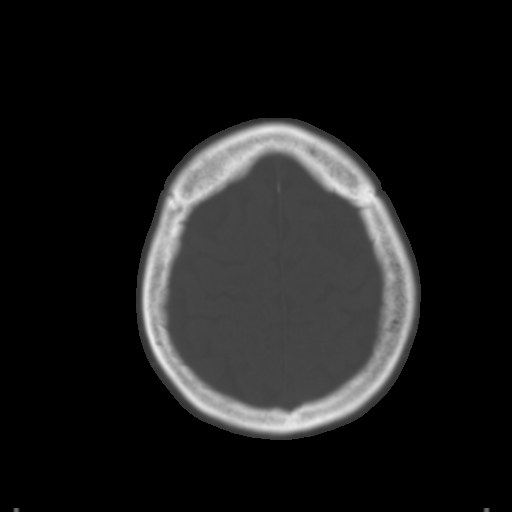
[im 29/35  brain]
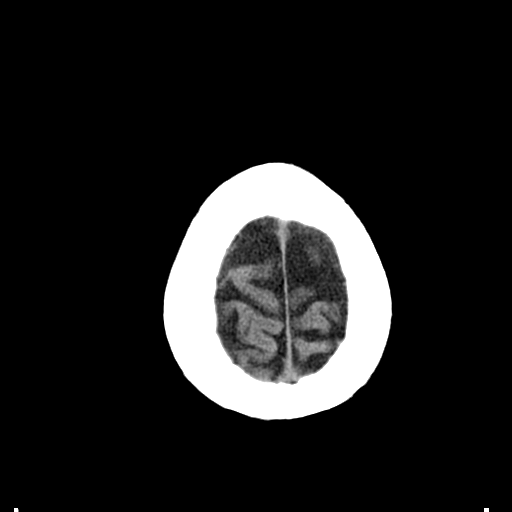
[im 31/35  brain]
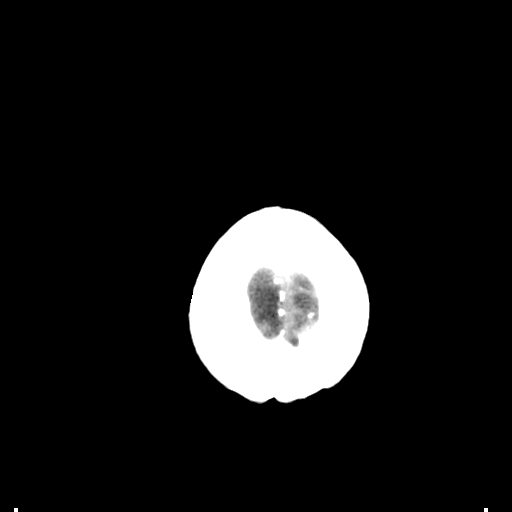
[im 33/35  brain]
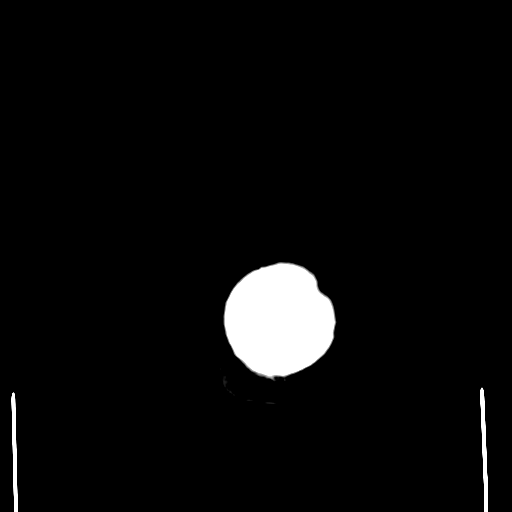

[16 of 30 positions shown; findings below may reference images not displayed]

FINDINGS: There has been interval near-complete resolution of the previously
demonstrated hyperdensity within the quadrigeminal and ambient
cisterns. There is no progressive subarachnoid hemorrhage. There is
no evidence of acute infarct, hydrocephalus or intraparenchymal
hemorrhage. Diffuse vascular calcifications are noted.

The visualized paranasal sinuses, mastoid air cells and middle ears
are clear. The calvarium is intact.
IMPRESSION: 1. Interval near-complete resolution of previously demonstrated
blood in the basilar cisterns.
2. No new findings.  No evidence of hydrocephalus.

## 2016-05-09 ENCOUNTER — Encounter (HOSPITAL_COMMUNITY): Payer: Self-pay | Admitting: Emergency Medicine

## 2016-05-09 ENCOUNTER — Inpatient Hospital Stay (HOSPITAL_COMMUNITY)
Admission: EM | Admit: 2016-05-09 | Discharge: 2016-05-12 | DRG: 445 | Disposition: A | Discharge status: Home / Self Care | Payer: Medicare Other | Attending: Internal Medicine | Admitting: Internal Medicine

## 2016-05-09 DIAGNOSIS — K219 Gastro-esophageal reflux disease without esophagitis: Secondary | ICD-10-CM | POA: Diagnosis present

## 2016-05-09 DIAGNOSIS — R079 Chest pain, unspecified: Secondary | ICD-10-CM | POA: Diagnosis not present

## 2016-05-09 DIAGNOSIS — I4892 Unspecified atrial flutter: Secondary | ICD-10-CM | POA: Diagnosis present

## 2016-05-09 DIAGNOSIS — K8020 Calculus of gallbladder without cholecystitis without obstruction: Secondary | ICD-10-CM | POA: Diagnosis not present

## 2016-05-09 DIAGNOSIS — R945 Abnormal results of liver function studies: Secondary | ICD-10-CM | POA: Insufficient documentation

## 2016-05-09 DIAGNOSIS — Z7901 Long term (current) use of anticoagulants: Secondary | ICD-10-CM

## 2016-05-09 DIAGNOSIS — I2510 Atherosclerotic heart disease of native coronary artery without angina pectoris: Secondary | ICD-10-CM | POA: Diagnosis present

## 2016-05-09 DIAGNOSIS — K811 Chronic cholecystitis: Secondary | ICD-10-CM | POA: Diagnosis present

## 2016-05-09 DIAGNOSIS — E1165 Type 2 diabetes mellitus with hyperglycemia: Secondary | ICD-10-CM | POA: Diagnosis present

## 2016-05-09 DIAGNOSIS — R0789 Other chest pain: Secondary | ICD-10-CM | POA: Diagnosis not present

## 2016-05-09 DIAGNOSIS — I9589 Other hypotension: Secondary | ICD-10-CM | POA: Diagnosis present

## 2016-05-09 DIAGNOSIS — Z79899 Other long term (current) drug therapy: Secondary | ICD-10-CM

## 2016-05-09 DIAGNOSIS — Z923 Personal history of irradiation: Secondary | ICD-10-CM

## 2016-05-09 DIAGNOSIS — IMO0002 Reserved for concepts with insufficient information to code with codable children: Secondary | ICD-10-CM | POA: Diagnosis present

## 2016-05-09 DIAGNOSIS — K7460 Unspecified cirrhosis of liver: Secondary | ICD-10-CM | POA: Diagnosis present

## 2016-05-09 DIAGNOSIS — K8012 Calculus of gallbladder with acute and chronic cholecystitis without obstruction: Principal | ICD-10-CM | POA: Diagnosis present

## 2016-05-09 DIAGNOSIS — E1122 Type 2 diabetes mellitus with diabetic chronic kidney disease: Secondary | ICD-10-CM | POA: Diagnosis present

## 2016-05-09 DIAGNOSIS — Z9221 Personal history of antineoplastic chemotherapy: Secondary | ICD-10-CM

## 2016-05-09 DIAGNOSIS — Z95810 Presence of automatic (implantable) cardiac defibrillator: Secondary | ICD-10-CM | POA: Diagnosis present

## 2016-05-09 DIAGNOSIS — I255 Ischemic cardiomyopathy: Secondary | ICD-10-CM | POA: Diagnosis present

## 2016-05-09 DIAGNOSIS — Z9989 Dependence on other enabling machines and devices: Secondary | ICD-10-CM

## 2016-05-09 DIAGNOSIS — Z800 Family history of malignant neoplasm of digestive organs: Secondary | ICD-10-CM

## 2016-05-09 DIAGNOSIS — I5022 Chronic systolic (congestive) heart failure: Secondary | ICD-10-CM | POA: Diagnosis present

## 2016-05-09 DIAGNOSIS — I214 Non-ST elevation (NSTEMI) myocardial infarction: Secondary | ICD-10-CM | POA: Diagnosis present

## 2016-05-09 DIAGNOSIS — Z8249 Family history of ischemic heart disease and other diseases of the circulatory system: Secondary | ICD-10-CM

## 2016-05-09 DIAGNOSIS — R7401 Elevation of levels of liver transaminase levels: Secondary | ICD-10-CM | POA: Diagnosis present

## 2016-05-09 DIAGNOSIS — Z85048 Personal history of other malignant neoplasm of rectum, rectosigmoid junction, and anus: Secondary | ICD-10-CM | POA: Diagnosis present

## 2016-05-09 DIAGNOSIS — C61 Malignant neoplasm of prostate: Secondary | ICD-10-CM | POA: Diagnosis present

## 2016-05-09 DIAGNOSIS — G4733 Obstructive sleep apnea (adult) (pediatric): Secondary | ICD-10-CM | POA: Diagnosis present

## 2016-05-09 DIAGNOSIS — E785 Hyperlipidemia, unspecified: Secondary | ICD-10-CM | POA: Diagnosis present

## 2016-05-09 DIAGNOSIS — R740 Nonspecific elevation of levels of transaminase and lactic acid dehydrogenase [LDH]: Secondary | ICD-10-CM

## 2016-05-09 DIAGNOSIS — N184 Chronic kidney disease, stage 4 (severe): Secondary | ICD-10-CM | POA: Diagnosis present

## 2016-05-09 DIAGNOSIS — Z955 Presence of coronary angioplasty implant and graft: Secondary | ICD-10-CM

## 2016-05-09 DIAGNOSIS — I482 Chronic atrial fibrillation, unspecified: Secondary | ICD-10-CM | POA: Diagnosis present

## 2016-05-09 DIAGNOSIS — E669 Obesity, unspecified: Secondary | ICD-10-CM | POA: Diagnosis present

## 2016-05-09 DIAGNOSIS — E1129 Type 2 diabetes mellitus with other diabetic kidney complication: Secondary | ICD-10-CM | POA: Diagnosis present

## 2016-05-09 DIAGNOSIS — I130 Hypertensive heart and chronic kidney disease with heart failure and stage 1 through stage 4 chronic kidney disease, or unspecified chronic kidney disease: Secondary | ICD-10-CM | POA: Diagnosis present

## 2016-05-09 DIAGNOSIS — K810 Acute cholecystitis: Secondary | ICD-10-CM | POA: Diagnosis present

## 2016-05-09 DIAGNOSIS — Z8673 Personal history of transient ischemic attack (TIA), and cerebral infarction without residual deficits: Secondary | ICD-10-CM

## 2016-05-09 DIAGNOSIS — I252 Old myocardial infarction: Secondary | ICD-10-CM

## 2016-05-09 DIAGNOSIS — Z933 Colostomy status: Secondary | ICD-10-CM

## 2016-05-09 IMAGING — CR DG CHEST 1V PORT
1 series · 1 of 1 positions shown · non-contrast
Comparison: 11/19/2014

CLINICAL DATA: Short of breath

EXAM:
PORTABLE CHEST - 1 VIEW

[portable]
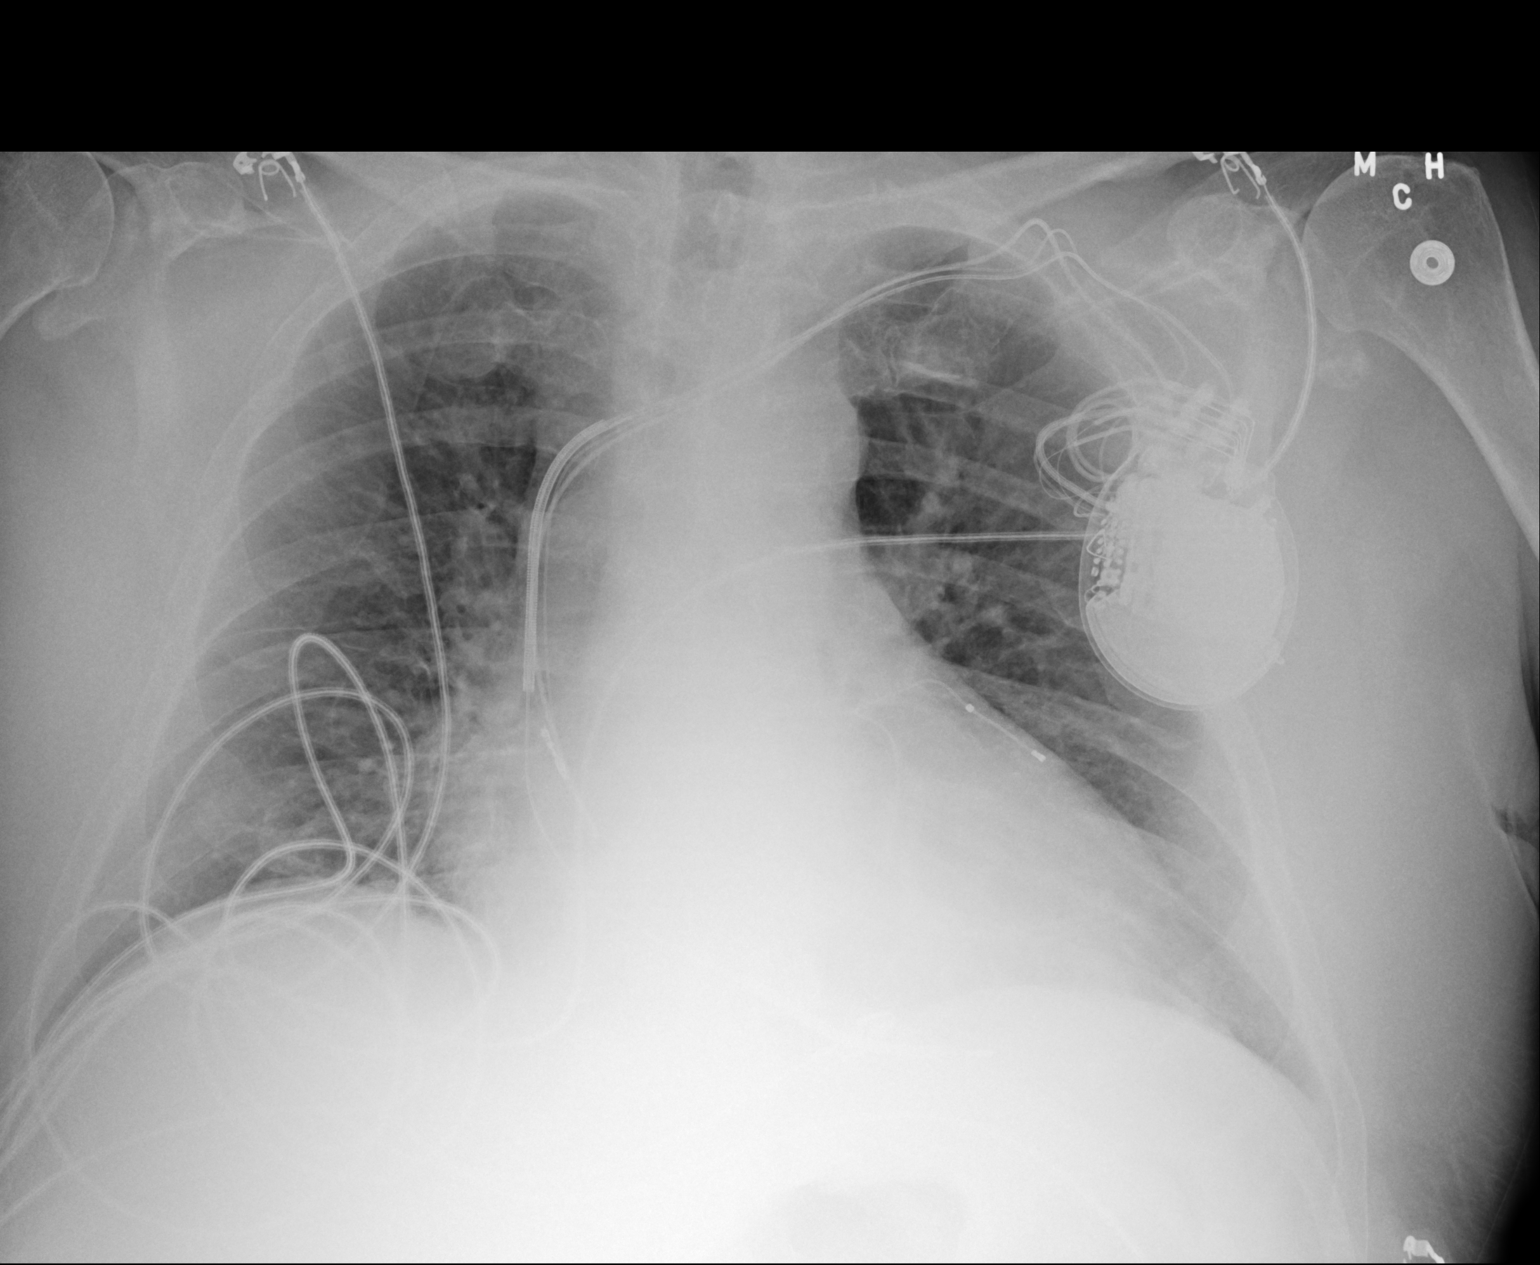

[1 of 1 positions shown; findings below may reference images not displayed]

FINDINGS: There is a left chest wall ICD with leads in the right atrial
appendage, coronary sinus and right ventricle. There is mild cardiac
enlargement. No pleural effusion or edema. No airspace
consolidation.
IMPRESSION: 1. No active cardiopulmonary abnormalities.

## 2016-05-09 NOTE — ED Provider Notes (Signed)
CSN: [id number]     Arrival date & time 05/09/16  2323 History  By signing my name below, I, [name], attest that this documentation has been prepared under the direction and in the presence of Orpah Greek, MD. Electronically Signed: [name], ED Scribe. 05/10/2016. 3:24 AM   Chief Complaint  Patient presents with  . Chest Pain   The history is provided by the patient. No language interpreter was used.   Brought in by EMS, [name] is a 60 y.o. male with an extensive PMHx including CAD, ischemic cardiomyopathy, a-fib, CHF, HTN, DM, and stage IV CKD who presents to the Emergency Department complaining of constant, sharp, mid-sternal chest pain with onset tonight. After the onset of pain the pt drove himself to the fire station and was transported to the ED. The pain was exacerbated by deep breathing and reclining. NTG spray provided insufficient pain relief at home but he notes that it resolved spontaneously while en route to the hospital. Pt states that he has had similar pain in the past but it typically resolves after 15 minutes. The pain felt different than pain that he has had in the past with heart attacks. His defibrillator did not fire tonight.  Pt denies SOB, LE swelling, and  weight gain. He weighed 189 pounds this morning. Pt is on eliquis and denies missing any doses.   Past Medical History  Diagnosis Date  . Essential hypertension, benign   . CAD (coronary artery disease)     a. BMS to LAD 2001 at [facility] b. PTCA/atherectomy ramus and BMS to LAD 2009  . Chronic systolic heart failure ([place])   . GERD (gastroesophageal reflux disease)   . Paroxysmal atrial fibrillation ([facility])     a. on amiodarone, digoxin and Eliquis  . TIA (transient ischemic attack)   . HLD (hyperlipidemia)   . Orthostatic hypotension   . Dizziness     a. chronic. Admission for this 07/18/2014  . Ischemic cardiomyopathy     EF 18% by nuclear study 2016, multiple myocardial infarctions  in past    . Obesity   . Hematuria   . History of blood transfusion     "I've had 2 units so far this year" (09/27/2015)  . Anemia   . SAH (subarachnoid hemorrhage) ([place])     post-traumatic (fall) [facility] 12/2014  . PONV (postoperative nausea and vomiting)   . CHF (congestive heart failure) ([place])   . Myocardial infarction ([place]) 2001  . HCAP (healthcare-associated pneumonia) 07/21/2015  . OSA on CPAP   . Chronic kidney disease, stage IV (severe) ([place])   . DM type 2, uncontrolled, with renal complications ([place])   . Colostomy in place [facility])   . Adenocarcinoma of rectum ([place])     a. 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 Prostate cancer ([place])     a. s/p seed implants with chemo and radiation   Past Surgical History  Procedure Laterality Date  . Cardiac defibrillator placement  2002  . Abdominal and perineal resection of rectum with total mesorectal excision  10/04/2007  . Colonoscopy  09/14/2011    Dr. Gala Romney: via colostomy, Single pedunculated benign inflammatory polyp. Due for surveillance Oct 2015  . Colostomy  2008  . Colonoscopy N/A 07/02/2014    Procedure: COLONOSCOPY;  Surgeon: Daneil Dolin, MD;  [facility];  Service: Endoscopy;  Laterality: N/A;  7:30 / COLONOSCOPY THRU COLOSTOMY  . Esophagogastroduodenoscopy N/A 07/02/2014    Procedure: ESOPHAGOGASTRODUODENOSCOPY (EGD);  Surgeon: Cristopher Estimable  Rourk, MD;  [facility];  Service: Endoscopy;  Laterality: N/A;  7:30  . Savory dilation N/A 07/02/2014    Procedure: SAVORY DILATION;  Surgeon: Daneil Dolin, MD;  [facility];  Service: Endoscopy;  Laterality: N/A;  7:30  . Maloney dilation N/A 07/02/2014    Procedure: Venia Minks DILATION;  Surgeon: Daneil Dolin, MD;  [facility];  Service: Endoscopy;  Laterality: N/A;  7:30  . Portacath placement  06/2007    "removed ~ 1 yr later"  . Left heart catheterization with coronary angiogram N/A 07/13/2013    Procedure: LEFT HEART CATHETERIZATION WITH CORONARY ANGIOGRAM;   Surgeon: Lorretta Harp, MD;  Location: [facility] CATH LAB;  Service: Cardiovascular;  Laterality: N/A;  . Colonoscopy N/A 12/11/2014    Dr. Gala Romney via colostomy. Normal. Repeat in 2021.   Marland Kitchen Esophagogastroduodenoscopy N/A 12/11/2014    [id number] EGD  . Cardiac defibrillator placement  2009    Upgraded to a BiV ICD  . Right heart catheterization N/A 02/24/2015    Procedure: RIGHT HEART CATH;  Surgeon: Jolaine Artist, MD;  Location: [facility] CATH LAB;  Service: Cardiovascular;  Laterality: N/A;  . Ep implantable device N/A 04/10/2015    Procedure: Ppm/Biv Ppm Generator Changeout;  Surgeon: Evans Lance, MD;  Location: [place] INVASIVE CV LAB CUPID;  Service: Cardiovascular;  Laterality: N/A;  . Bi-ventricular implantable cardioverter defibrillator  (crt-d)  2009  . Cardiac catheterization  08/2001; 2009    ; /notes 07/10/2013  . Coronary angioplasty with stent placement  2001; ~ 2006    "1 + 1"   . Givens capsule study N/A 07/23/2015    Procedure: GIVENS CAPSULE STUDY;  Surgeon: Wilford Corner, MD;  Location: [facility] ENDOSCOPY;  Service: Endoscopy;  Laterality: N/A;  . Colonoscopy N/A 08/24/2015    Procedure: COLONOSCOPY;  Surgeon: Manus Gunning, MD;  Location: [place];  Service: Gastroenterology;  Laterality: N/A;  . Electrophysiologic study N/A 08/28/2015    Procedure: AV Node Ablation;  Surgeon: Will Meredith Leeds, MD;  Location: [place] CV LAB;  Service: Cardiovascular;  Laterality: N/A;  . Av fistula placement Right 09/16/2015    Procedure: ARTERIOVENOUS (AV) FISTULA CREATION - BRACHIOCEPHALIC;  Surgeon: Elam Dutch, MD;  Location: [facility] OR;  Service: Vascular;  Laterality: Right;   Family History  Problem Relation Age of Onset  . Colon cancer Mother 1  . Colon cancer Sister 33  . Coronary artery disease Father   . Colon cancer Other     2 cousins, succumbed to illness   Social History  Substance Use Topics  . Smoking status: Never Smoker   . Smokeless tobacco: Never Used  . Alcohol Use:  No     Comment: Former user 45 years ago    Review of Systems  Respiratory: Negative for shortness of breath.   Cardiovascular: Positive for chest pain.  All other systems reviewed and are negative.   Allergies  Review of patient's allergies indicates no known allergies.  Home Medications   Prior to Admission medications   Medication Sig Start Date End Date Taking? Authorizing Provider  apixaban (ELIQUIS) 5 MG TABS tablet Take 1 tablet (5 mg total) by mouth 2 (two) times daily. 04/26/16   Jolaine Artist, MD  atorvastatin (LIPITOR) 80 MG tablet Take 1 tablet (80 mg total) by mouth daily. 09/04/15   Larey Dresser, MD  co-enzyme Q-10 50 MG capsule Take 50 mg by mouth every morning.  Historical Provider, MD  ferrous sulfate 325 (65 FE) MG tablet Take 1 tablet (325 mg total) by mouth 2 (two) times daily with a meal. 07/24/15   Shanker Kristeen Mans, MD  fluticasone (FLONASE) 50 MCG/ACT nasal spray Place 1 spray into both nostrils daily as needed for allergies.  11/21/13   Historical Provider, MD  metolazone (ZAROXOLYN) 2.5 MG tablet Take 1 tab weekly as needed for swelling Patient not taking: Reported on 05/07/2016 03/03/16   Arnoldo Lenis, MD  metoprolol succinate (TOPROL-XL) 25 MG 24 hr tablet Take 0.5 tablets (12.5 mg total) by mouth 2 (two) times daily. 04/12/16   Jolaine Artist, MD  Omega-3 Fatty Acids (FISH OIL) 1000 MG CAPS Take 1,000 mg by mouth 2 (two) times daily.    Historical Provider, MD  pantoprazole (PROTONIX) 40 MG tablet Take 1 tablet (40 mg total) by mouth daily. 05/07/16   Shirley Friar, PA-C  potassium chloride SA (K-DUR,KLOR-CON) 20 MEQ tablet Take 20 mEq by mouth daily.    Historical Provider, MD  PROAIR RESPICLICK [id number] (90 BASE) MCG/ACT AEPB Inhale 2 puffs into the lungs every 6 (six) hours as needed (for breathing).  08/15/15   Historical Provider, MD  torsemide (DEMADEX) 20 MG tablet Take 4 tablets by mouth every morning and 2 tables by mouth every evening  04/12/16   Amy D Clegg, NP  traMADol (ULTRAM) 50 MG tablet Take 1 tablet (50 mg total) by mouth every 6 (six) hours as needed. 03/14/16   Evalee Jefferson, PA-C   BP 95/56 mmHg  Pulse 70  Resp 13  Wt 189 lb (85.73 kg)  SpO2 97% Physical Exam  Constitutional: He is oriented to person, place, and time. He appears well-developed and well-nourished. No distress.  HENT:  Head: Normocephalic and atraumatic.  Right Ear: Hearing normal.  Left Ear: Hearing normal.  Nose: Nose normal.  Mouth/Throat: Oropharynx is clear and moist and mucous membranes are normal.  Eyes: Conjunctivae and EOM are normal. Pupils are equal, round, and reactive to light.  Neck: Normal range of motion. Neck supple.  Cardiovascular: Regular rhythm, S1 normal and S2 normal.  Exam reveals no gallop and no friction rub.   No murmur heard. Pulmonary/Chest: Effort normal and breath sounds normal. No respiratory distress. He exhibits no tenderness.  Abdominal: Soft. Normal appearance and bowel sounds are normal. There is no hepatosplenomegaly. There is no tenderness. There is no rebound, no guarding, no tenderness at McBurney's point and negative Murphy's sign. No hernia.  Musculoskeletal: Normal range of motion. He exhibits edema.  Trace edema BLEs   Neurological: He is alert and oriented to person, place, and time. He has normal strength. No cranial nerve deficit or sensory deficit. Coordination normal. GCS eye subscore is 4. GCS verbal subscore is 5. GCS motor subscore is 6.  Skin: Skin is warm, dry and intact. No rash noted. No cyanosis.  Psychiatric: He has a normal mood and affect. His speech is normal and behavior is normal. Thought content normal.  Nursing note and vitals reviewed.   ED Course  Procedures (including critical care time) DIAGNOSTIC STUDIES: Oxygen Saturation is 97% on RA,  normal by my interpretation.  COORDINATION OF CARE: 11:38 PM Discussed treatment plan which includes lab work, CXR, EKG with pt at bedside  and pt agreed to plan.  3:20 AM-Consult complete with Dr. Blaine Hamper (Hospitalist). Patient case explained and discussed. Agrees to admit patient for further evaluation and treatment. Call ended at 3:23 AM  Labs Review  Labs Reviewed  CBC WITH DIFFERENTIAL/PLATELET - Abnormal; Notable for the following:    RBC 3.51 (*)    Hemoglobin 11.1 (*)    HCT 33.8 (*)    Platelets 145 (*)    All other components within normal limits  COMPREHENSIVE METABOLIC PANEL - Abnormal; Notable for the following:    Sodium 134 (*)    Potassium 3.4 (*)    Chloride 98 (*)    Glucose, Bld 305 (*)    BUN 41 (*)    Creatinine, Ser 2.79 (*)    Albumin 3.2 (*)    AST 185 (*)    ALT 141 (*)    Alkaline Phosphatase 219 (*)    Total Bilirubin 2.2 (*)    GFR calc non Af Amer 23 (*)    GFR calc Af Amer 27 (*)    All other components within normal limits  TROPONIN I - Abnormal; Notable for the following:    Troponin I 0.04 (*)    All other components within normal limits  BRAIN NATRIURETIC PEPTIDE - Abnormal; Notable for the following:    B Natriuretic Peptide 377.6 (*)    All other components within normal limits  PROTIME-INR - Abnormal; Notable for the following:    Prothrombin Time 18.3 (*)    INR 1.51 (*)    All other components within normal limits  LIPASE, BLOOD  AMMONIA    Imaging Review Dg Chest 2 View  05/10/2016  CLINICAL DATA:  60 year old male with right chest pain. EXAM: CHEST  2 VIEW COMPARISON:  Radiograph dated 09/30/2015 FINDINGS: Two views of the chest demonstrate clear lungs with sharp costophrenic angles. There is no pneumothorax. Stable mild cardiomegaly. Left pectoral AICD device. There is osteopenia with degenerative changes of the spine. No acute fracture identified. IMPRESSION: No active cardiopulmonary disease. Electronically Signed   By: Anner Crete M.D.   On: 05/10/2016 00:33   US Abdomen Complete  05/10/2016  CLINICAL DATA:  Upper abdominal pain.  Elevated LFTs. EXAM: ABDOMEN  ULTRASOUND COMPLETE COMPARISON:  Abdominal CT 05/05/2016 FINDINGS: Gallbladder: Cholelithiasis. There is gallbladder wall thickening which is attributed to underdistention. No focal tenderness or pericholecystic inflammation. Common bile duct: Diameter: 4 mm. Where visualized, no filling defect. Liver: Coarsened echogenic liver correlating with cirrhotic findings on previous CT. No focal finding. Antegrade flow in the imaged portal venous system. No ascites. IVC: No abnormality visualized. Pancreas: Obscured.  No significant finding on recent CT. Spleen: Size and appearance within normal limits. Right Kidney: Length: 10 cm. Echogenicity within normal limits. No mass or hydronephrosis visualized. Left Kidney: Length: 10 cm. Echogenicity within normal limits. No mass or hydronephrosis visualized. Abdominal aorta: No aneurysm visualized. IMPRESSION: 1. Cholelithiasis without acute cholecystitis. 2. Cirrhosis without ascites. 3. Obscured pancreas. Electronically Signed   By: Monte Fantasia M.D.   On: 05/10/2016 02:49   I have personally reviewed and evaluated these images and lab results as part of my medical decision-making.   EKG Interpretation   Date/Time:  Sunday May 09 2016 23:45:56 EDT Ventricular Rate:  70 PR Interval:  198 QRS Duration: 176 QT Interval:  497 QTC Calculation: 536 R Axis:   175 Text Interpretation:  VENTRICULAR PACED RHYTHM Confirmed by Betsey Holiday  MD,  CHRISTOPHER [phone]) on 05/09/2016 11:54:00 PM      MDM   Final diagnoses:  Chest pain, unspecified chest pain type  Calculus of gallbladder without cholecystitis without obstruction    Patient presents to the emergency department for evaluation of chest  pain. Patient reports substernal and low chest discomfort that occurred earlier tonight. Pain did not radiate to the back or arms. Patient reports that he had the use nitroglycerin for the first time secondary to the pain. He does not feel that he had any relief from the  nitroglycerin, however, upon transport to the ER by EMS he had spontaneous resolution of the pain and is now comfortable.  Patient does have extensive cardiac history. He has an ischemic cardiomyopathy, previous multiple MIs. He has stents. Patient is chronically in atrial fibrillation. He was tachycardic initially but this has resolved. Initial EKG looks like ventricular pacing. He has underlying atrial fibrillation. Initial troponin is 0.04. It looks like he has chronically mildly elevated troponins of around 0.04 2.05 in the past. Will need to cycle cardiac enzymes.  She did have elevated LFTs. Etiology is unclear. It is most likely multifactorial. Ultrasound did show cholelithiasis, however, there is no obvious cholecystitis. He does not have a Murphy sign on examination. He does have evidence of cirrhosis. As he does not have obvious cholecystitis and he has significant cardiac disease, he is a poor surgical candidate and I do not feel he requires a surgical evaluation at this time. He is pain-free without intervention. He will require hospitalization for further cardiac evaluation and monitoring of his LFTs and abdominal pain.  I personally performed the services described in this documentation, which was scribed in my presence. The recorded information has been reviewed and is accurate.    Orpah Greek, MD 05/10/16 [phone]

## 2016-05-09 NOTE — ED Notes (Signed)
Per EMS, pt coming from home with mid-sternal, non-radiating CP. Extensive existing cardiac hx. No N/V/D, dizziness, lightheadness. Pt took 1 nitro spray at home prior to EMS arrival. Resolution to CP from nitro. No ASA d/t pt on blood thinners.

## 2016-05-10 ENCOUNTER — Encounter (HOSPITAL_COMMUNITY): Payer: Self-pay | Admitting: Cardiology

## 2016-05-10 ENCOUNTER — Ambulatory Visit: Payer: Medicare Other | Admitting: Oncology

## 2016-05-10 ENCOUNTER — Emergency Department (HOSPITAL_COMMUNITY): Payer: Medicare Other

## 2016-05-10 ENCOUNTER — Other Ambulatory Visit: Payer: Medicare Other

## 2016-05-10 DIAGNOSIS — Z95810 Presence of automatic (implantable) cardiac defibrillator: Secondary | ICD-10-CM

## 2016-05-10 DIAGNOSIS — Z923 Personal history of irradiation: Secondary | ICD-10-CM | POA: Diagnosis not present

## 2016-05-10 DIAGNOSIS — I5022 Chronic systolic (congestive) heart failure: Secondary | ICD-10-CM | POA: Diagnosis present

## 2016-05-10 DIAGNOSIS — Z79899 Other long term (current) drug therapy: Secondary | ICD-10-CM | POA: Diagnosis not present

## 2016-05-10 DIAGNOSIS — Z8249 Family history of ischemic heart disease and other diseases of the circulatory system: Secondary | ICD-10-CM | POA: Diagnosis not present

## 2016-05-10 DIAGNOSIS — Z7901 Long term (current) use of anticoagulants: Secondary | ICD-10-CM

## 2016-05-10 DIAGNOSIS — K7460 Unspecified cirrhosis of liver: Secondary | ICD-10-CM | POA: Diagnosis not present

## 2016-05-10 DIAGNOSIS — Z85048 Personal history of other malignant neoplasm of rectum, rectosigmoid junction, and anus: Secondary | ICD-10-CM | POA: Diagnosis not present

## 2016-05-10 DIAGNOSIS — R7989 Other specified abnormal findings of blood chemistry: Secondary | ICD-10-CM | POA: Insufficient documentation

## 2016-05-10 DIAGNOSIS — I130 Hypertensive heart and chronic kidney disease with heart failure and stage 1 through stage 4 chronic kidney disease, or unspecified chronic kidney disease: Secondary | ICD-10-CM | POA: Diagnosis present

## 2016-05-10 DIAGNOSIS — R072 Precordial pain: Secondary | ICD-10-CM

## 2016-05-10 DIAGNOSIS — N184 Chronic kidney disease, stage 4 (severe): Secondary | ICD-10-CM

## 2016-05-10 DIAGNOSIS — I482 Chronic atrial fibrillation: Secondary | ICD-10-CM | POA: Diagnosis not present

## 2016-05-10 DIAGNOSIS — I255 Ischemic cardiomyopathy: Secondary | ICD-10-CM

## 2016-05-10 DIAGNOSIS — K810 Acute cholecystitis: Secondary | ICD-10-CM | POA: Diagnosis not present

## 2016-05-10 DIAGNOSIS — I252 Old myocardial infarction: Secondary | ICD-10-CM | POA: Diagnosis not present

## 2016-05-10 DIAGNOSIS — K811 Chronic cholecystitis: Secondary | ICD-10-CM | POA: Diagnosis not present

## 2016-05-10 DIAGNOSIS — D696 Thrombocytopenia, unspecified: Secondary | ICD-10-CM | POA: Diagnosis not present

## 2016-05-10 DIAGNOSIS — E785 Hyperlipidemia, unspecified: Secondary | ICD-10-CM

## 2016-05-10 DIAGNOSIS — R945 Abnormal results of liver function studies: Secondary | ICD-10-CM | POA: Insufficient documentation

## 2016-05-10 DIAGNOSIS — K219 Gastro-esophageal reflux disease without esophagitis: Secondary | ICD-10-CM

## 2016-05-10 DIAGNOSIS — G4733 Obstructive sleep apnea (adult) (pediatric): Secondary | ICD-10-CM | POA: Diagnosis present

## 2016-05-10 DIAGNOSIS — Z9221 Personal history of antineoplastic chemotherapy: Secondary | ICD-10-CM | POA: Diagnosis not present

## 2016-05-10 DIAGNOSIS — D61818 Other pancytopenia: Secondary | ICD-10-CM | POA: Diagnosis not present

## 2016-05-10 DIAGNOSIS — E1122 Type 2 diabetes mellitus with diabetic chronic kidney disease: Secondary | ICD-10-CM | POA: Diagnosis present

## 2016-05-10 DIAGNOSIS — R079 Chest pain, unspecified: Secondary | ICD-10-CM

## 2016-05-10 DIAGNOSIS — I2510 Atherosclerotic heart disease of native coronary artery without angina pectoris: Secondary | ICD-10-CM | POA: Diagnosis not present

## 2016-05-10 DIAGNOSIS — Z955 Presence of coronary angioplasty implant and graft: Secondary | ICD-10-CM | POA: Diagnosis not present

## 2016-05-10 DIAGNOSIS — I9589 Other hypotension: Secondary | ICD-10-CM | POA: Diagnosis present

## 2016-05-10 DIAGNOSIS — R740 Nonspecific elevation of levels of transaminase and lactic acid dehydrogenase [LDH]: Secondary | ICD-10-CM

## 2016-05-10 DIAGNOSIS — K8020 Calculus of gallbladder without cholecystitis without obstruction: Secondary | ICD-10-CM | POA: Diagnosis not present

## 2016-05-10 DIAGNOSIS — I214 Non-ST elevation (NSTEMI) myocardial infarction: Secondary | ICD-10-CM | POA: Diagnosis not present

## 2016-05-10 DIAGNOSIS — R0789 Other chest pain: Secondary | ICD-10-CM

## 2016-05-10 DIAGNOSIS — K8012 Calculus of gallbladder with acute and chronic cholecystitis without obstruction: Secondary | ICD-10-CM | POA: Diagnosis present

## 2016-05-10 DIAGNOSIS — Z800 Family history of malignant neoplasm of digestive organs: Secondary | ICD-10-CM | POA: Diagnosis not present

## 2016-05-10 DIAGNOSIS — C61 Malignant neoplasm of prostate: Secondary | ICD-10-CM | POA: Diagnosis present

## 2016-05-10 DIAGNOSIS — I4892 Unspecified atrial flutter: Secondary | ICD-10-CM | POA: Diagnosis present

## 2016-05-10 DIAGNOSIS — Z933 Colostomy status: Secondary | ICD-10-CM | POA: Diagnosis not present

## 2016-05-10 DIAGNOSIS — E1165 Type 2 diabetes mellitus with hyperglycemia: Secondary | ICD-10-CM | POA: Diagnosis present

## 2016-05-10 DIAGNOSIS — E669 Obesity, unspecified: Secondary | ICD-10-CM | POA: Diagnosis present

## 2016-05-10 DIAGNOSIS — Z8673 Personal history of transient ischemic attack (TIA), and cerebral infarction without residual deficits: Secondary | ICD-10-CM | POA: Diagnosis not present

## 2016-05-10 DIAGNOSIS — K7689 Other specified diseases of liver: Secondary | ICD-10-CM | POA: Diagnosis not present

## 2016-05-10 LAB — TROPONIN I
TROPONIN I: 0.04 ng/mL — AB (ref <0.031)
TROPONIN I: 0.03 ng/mL (ref <0.031)
Troponin I: 0.04 ng/mL — ABNORMAL HIGH (ref <0.031)
Troponin I: 0.04 ng/mL — ABNORMAL HIGH (ref <0.031)

## 2016-05-10 LAB — CBC WITH DIFFERENTIAL/PLATELET
BASOS PCT: 0 %
Basophils Absolute: 0 10*3/uL (ref 0.0–0.1)
EOS PCT: 2 %
Eosinophils Absolute: 0.2 10*3/uL (ref 0.0–0.7)
HEMATOCRIT: 33.8 % — AB (ref 39.0–52.0)
Hemoglobin: 11.1 g/dL — ABNORMAL LOW (ref 13.0–17.0)
LYMPHS PCT: 14 %
Lymphs Abs: 1.1 10*3/uL (ref 0.7–4.0)
MCH: 31.6 pg (ref 26.0–34.0)
MCHC: 32.8 g/dL (ref 30.0–36.0)
MCV: 96.3 fL (ref 78.0–100.0)
MONO ABS: 0.6 10*3/uL (ref 0.1–1.0)
MONOS PCT: 7 %
NEUTROS ABS: 6.2 10*3/uL (ref 1.7–7.7)
Neutrophils Relative %: 77 %
PLATELETS: 145 10*3/uL — AB (ref 150–400)
RBC: 3.51 MIL/uL — ABNORMAL LOW (ref 4.22–5.81)
RDW: 13.8 % (ref 11.5–15.5)
WBC: 8.1 10*3/uL (ref 4.0–10.5)

## 2016-05-10 LAB — BASIC METABOLIC PANEL
ANION GAP: 10 (ref 5–15)
BUN: 42 mg/dL — ABNORMAL HIGH (ref 6–20)
CALCIUM: 9.2 mg/dL (ref 8.9–10.3)
CO2: 24 mmol/L (ref 22–32)
Chloride: 102 mmol/L (ref 101–111)
Creatinine, Ser: 2.65 mg/dL — ABNORMAL HIGH (ref 0.61–1.24)
GFR calc Af Amer: 28 mL/min — ABNORMAL LOW (ref >60)
GFR calc non Af Amer: 24 mL/min — ABNORMAL LOW (ref >60)
GLUCOSE: 245 mg/dL — AB (ref 65–99)
POTASSIUM: 3.4 mmol/L — AB (ref 3.5–5.1)
Sodium: 136 mmol/L (ref 135–145)

## 2016-05-10 LAB — LIPID PANEL
Cholesterol: 163 mg/dL (ref 0–200)
HDL: 27 mg/dL — ABNORMAL LOW (ref >40)
LDL Cholesterol: 117 mg/dL — ABNORMAL HIGH (ref 0–99)
Total CHOL/HDL Ratio: 6 RATIO
Triglycerides: 96 mg/dL (ref <150)
VLDL: 19 mg/dL (ref 0–40)

## 2016-05-10 LAB — COMPREHENSIVE METABOLIC PANEL
ALT: 141 U/L — ABNORMAL HIGH (ref 17–63)
ANION GAP: 11 (ref 5–15)
AST: 185 U/L — ABNORMAL HIGH (ref 15–41)
Albumin: 3.2 g/dL — ABNORMAL LOW (ref 3.5–5.0)
Alkaline Phosphatase: 219 U/L — ABNORMAL HIGH (ref 38–126)
BUN: 41 mg/dL — AB (ref 6–20)
CALCIUM: 9.7 mg/dL (ref 8.9–10.3)
CO2: 25 mmol/L (ref 22–32)
CREATININE: 2.79 mg/dL — AB (ref 0.61–1.24)
Chloride: 98 mmol/L — ABNORMAL LOW (ref 101–111)
GFR, EST AFRICAN AMERICAN: 27 mL/min — AB (ref >60)
GFR, EST NON AFRICAN AMERICAN: 23 mL/min — AB (ref >60)
GLUCOSE: 305 mg/dL — AB (ref 65–99)
Potassium: 3.4 mmol/L — ABNORMAL LOW (ref 3.5–5.1)
SODIUM: 134 mmol/L — AB (ref 135–145)
TOTAL PROTEIN: 7.6 g/dL (ref 6.5–8.1)
Total Bilirubin: 2.2 mg/dL — ABNORMAL HIGH (ref 0.3–1.2)

## 2016-05-10 LAB — CK: Total CK: 38 U/L — ABNORMAL LOW (ref 49–397)

## 2016-05-10 LAB — LIPASE, BLOOD: Lipase: 49 U/L (ref 11–51)

## 2016-05-10 LAB — HEPATIC FUNCTION PANEL
ALBUMIN: 3.1 g/dL — AB (ref 3.5–5.0)
ALK PHOS: 218 U/L — AB (ref 38–126)
ALT: 352 U/L — ABNORMAL HIGH (ref 17–63)
AST: 579 U/L — AB (ref 15–41)
BILIRUBIN TOTAL: 3.2 mg/dL — AB (ref 0.3–1.2)
Bilirubin, Direct: 1.9 mg/dL — ABNORMAL HIGH (ref 0.1–0.5)
Indirect Bilirubin: 1.3 mg/dL — ABNORMAL HIGH (ref 0.3–0.9)
Total Protein: 7.6 g/dL (ref 6.5–8.1)

## 2016-05-10 LAB — GLUCOSE, CAPILLARY
Glucose-Capillary: 184 mg/dL — ABNORMAL HIGH (ref 65–99)
Glucose-Capillary: 174 mg/dL — ABNORMAL HIGH (ref 65–99)
Glucose-Capillary: 145 mg/dL — ABNORMAL HIGH (ref 65–99)

## 2016-05-10 LAB — MAGNESIUM: MAGNESIUM: 2.5 mg/dL — AB (ref 1.7–2.4)

## 2016-05-10 LAB — PROTIME-INR
INR: 1.51 — ABNORMAL HIGH (ref 0.00–1.49)
Prothrombin Time: 18.3 seconds — ABNORMAL HIGH (ref 11.6–15.2)

## 2016-05-10 LAB — BRAIN NATRIURETIC PEPTIDE: B NATRIURETIC PEPTIDE 5: 377.6 pg/mL — AB (ref 0.0–100.0)

## 2016-05-10 LAB — AMMONIA: Ammonia: 21 umol/L (ref 9–35)

## 2016-05-10 MED ORDER — PIPERACILLIN-TAZOBACTAM 3.375 G IVPB
3.3750 g | Freq: Three times a day (TID) | INTRAVENOUS | Status: DC
  Administered 2016-05-10 – 2016-05-12 (×6): 3.375 g via INTRAVENOUS
  Filled 2016-05-10 (×8): qty 50

## 2016-05-10 MED ORDER — MORPHINE SULFATE (PF) 2 MG/ML IV SOLN: 2.0000 mg | INTRAVENOUS | Status: DC | PRN

## 2016-05-10 MED ORDER — ONDANSETRON HCL 4 MG/2ML IJ SOLN: 4.0000 mg | Freq: Four times a day (QID) | INTRAMUSCULAR | Status: DC | PRN

## 2016-05-10 MED ORDER — ALBUTEROL SULFATE (2.5 MG/3ML) 0.083% IN NEBU: 3.0000 mL | INHALATION_SOLUTION | Freq: Four times a day (QID) | RESPIRATORY_TRACT | Status: DC | PRN

## 2016-05-10 MED ORDER — POTASSIUM CHLORIDE CRYS ER 20 MEQ PO TBCR: 20.0000 meq | EXTENDED_RELEASE_TABLET | Freq: Every day | ORAL | Status: DC

## 2016-05-10 MED ORDER — ASPIRIN EC 81 MG PO TBEC
81.0000 mg | DELAYED_RELEASE_TABLET | Freq: Every day | ORAL | Status: DC
  Administered 2016-05-11 – 2016-05-12 (×2): 81 mg via ORAL
  Filled 2016-05-10 (×2): qty 1

## 2016-05-10 MED ORDER — ATORVASTATIN CALCIUM 80 MG PO TABS: 80.0000 mg | ORAL_TABLET | Freq: Every day | ORAL | Status: DC

## 2016-05-10 MED ORDER — INSULIN ASPART 100 UNIT/ML ~~LOC~~ SOLN
0.0000 [IU] | Freq: Three times a day (TID) | SUBCUTANEOUS | Status: DC
  Administered 2016-05-10 (×2): 2 [IU] via SUBCUTANEOUS
  Administered 2016-05-10: 1 [IU] via SUBCUTANEOUS
  Administered 2016-05-11 (×2): 2 [IU] via SUBCUTANEOUS
  Administered 2016-05-11: 3 [IU] via SUBCUTANEOUS
  Administered 2016-05-12: 2 [IU] via SUBCUTANEOUS

## 2016-05-10 MED ORDER — TORSEMIDE 20 MG PO TABS
80.0000 mg | ORAL_TABLET | Freq: Every day | ORAL | Status: DC
  Administered 2016-05-10 – 2016-05-12 (×3): 80 mg via ORAL
  Filled 2016-05-10 (×3): qty 4

## 2016-05-10 MED ORDER — POTASSIUM CHLORIDE CRYS ER 20 MEQ PO TBCR
20.0000 meq | EXTENDED_RELEASE_TABLET | Freq: Once | ORAL | Status: AC
  Administered 2016-05-10: 20 meq via ORAL
  Filled 2016-05-10: qty 1

## 2016-05-10 MED ORDER — POTASSIUM CHLORIDE CRYS ER 20 MEQ PO TBCR
20.0000 meq | EXTENDED_RELEASE_TABLET | Freq: Two times a day (BID) | ORAL | Status: AC
  Administered 2016-05-10 – 2016-05-11 (×3): 20 meq via ORAL
  Filled 2016-05-10 (×3): qty 1

## 2016-05-10 MED ORDER — INSULIN ASPART 100 UNIT/ML ~~LOC~~ SOLN
10.0000 [IU] | Freq: Once | SUBCUTANEOUS | Status: AC
  Administered 2016-05-10: 10 [IU] via SUBCUTANEOUS
  Filled 2016-05-10: qty 1

## 2016-05-10 MED ORDER — TORSEMIDE 20 MG PO TABS
40.0000 mg | ORAL_TABLET | Freq: Every day | ORAL | Status: DC
  Administered 2016-05-10 – 2016-05-11 (×2): 40 mg via ORAL
  Filled 2016-05-10 (×2): qty 2

## 2016-05-10 MED ORDER — CO-ENZYME Q-10 50 MG PO CAPS: 50.0000 mg | ORAL_CAPSULE | Freq: Every morning | ORAL | Status: DC

## 2016-05-10 MED ORDER — PANTOPRAZOLE SODIUM 40 MG PO TBEC
40.0000 mg | DELAYED_RELEASE_TABLET | Freq: Every day | ORAL | Status: DC
  Administered 2016-05-10 – 2016-05-12 (×3): 40 mg via ORAL
  Filled 2016-05-10 (×3): qty 1

## 2016-05-10 MED ORDER — FERROUS SULFATE 325 (65 FE) MG PO TABS
325.0000 mg | ORAL_TABLET | Freq: Two times a day (BID) | ORAL | Status: DC
  Administered 2016-05-11 – 2016-05-12 (×3): 325 mg via ORAL
  Filled 2016-05-10 (×5): qty 1

## 2016-05-10 MED ORDER — OMEGA-3-ACID ETHYL ESTERS 1 G PO CAPS
1.0000 g | ORAL_CAPSULE | Freq: Every day | ORAL | Status: DC
  Administered 2016-05-10 – 2016-05-12 (×3): 1 g via ORAL
  Filled 2016-05-10 (×3): qty 1

## 2016-05-10 MED ORDER — NITROGLYCERIN 0.4 MG SL SUBL: 0.4000 mg | SUBLINGUAL_TABLET | SUBLINGUAL | Status: DC | PRN

## 2016-05-10 MED ORDER — METOPROLOL SUCCINATE ER 25 MG PO TB24
12.5000 mg | ORAL_TABLET | Freq: Two times a day (BID) | ORAL | Status: DC
  Administered 2016-05-10 – 2016-05-12 (×5): 12.5 mg via ORAL
  Filled 2016-05-10 (×5): qty 1

## 2016-05-10 MED ORDER — INSULIN GLARGINE 100 UNIT/ML ~~LOC~~ SOLN
20.0000 [IU] | Freq: Every day | SUBCUTANEOUS | Status: DC
  Filled 2016-05-10: qty 0.2

## 2016-05-10 MED ORDER — TRAMADOL HCL 50 MG PO TABS: 50.0000 mg | ORAL_TABLET | Freq: Four times a day (QID) | ORAL | Status: DC | PRN

## 2016-05-10 MED ORDER — INSULIN GLARGINE 100 UNIT/ML ~~LOC~~ SOLN
20.0000 [IU] | Freq: Every day | SUBCUTANEOUS | Status: DC
  Administered 2016-05-10 – 2016-05-12 (×3): 20 [IU] via SUBCUTANEOUS
  Filled 2016-05-10 (×3): qty 0.2

## 2016-05-10 MED ORDER — APIXABAN 5 MG PO TABS
5.0000 mg | ORAL_TABLET | Freq: Two times a day (BID) | ORAL | Status: DC
  Administered 2016-05-10 – 2016-05-12 (×5): 5 mg via ORAL
  Filled 2016-05-10 (×5): qty 1

## 2016-05-10 MED ORDER — FLUTICASONE PROPIONATE 50 MCG/ACT NA SUSP
1.0000 | Freq: Every day | NASAL | Status: DC | PRN
  Filled 2016-05-10: qty 16

## 2016-05-10 NOTE — Progress Notes (Signed)
Patient arrived to 3E22 from [facility]. Admit with chest pain. No complaints of pain. No Skin breakdown. SR on telemetry.

## 2016-05-10 NOTE — H&P (Signed)
History and Physical    [name] [id number] DOB: 1955/12/12 DOA: 05/09/2016  Referring MD/NP/PA:   PCP: Glo Herring., MD   Patient coming from:  The patient is coming from home.  At baseline, pt is independent for most of ADL.       Chief Complaint: Chest pain  HPI: [name] is a 60 y.o. male with medical history significant of CAD, MI, s/p of stent, chronic mild elevation of troponin, PAF on Eliquis, hyperlipidemia, diabetes mellitus, GERD, ICD placement, TIA, SDH, sCHF with EF 35%, OSA on CPAP, CKD-IV, rectal cancer (s/p of colotomy), prostate cancer (s/p of seed with chemotherapy and XRT), chronic cholecystitis, chronic hypotension, who presents with chest pain.  Patient reports that he started having chest pain at about 9:30 PM. It is located in the substernal area, constant, sharp, 10 out of 10 in severity, nonradiating. It is aggravated by deep breath and reclining. He used NTG spray without immediate improvement, but he notes that it resolved spontaneously while en route to the hospital. It lasted for about 1 hour. Pt states that he has had similar pain in the past, but it typically resolves after 15 minutes. The pain felt different than pain that he has had in the past with heart attacks. Patient denies shortness of breath, cough, nausea, vomiting, diarrhea, abdominal pain. No symptoms of UTI or unilateral weakness. He does not have leg edema. Pt is on eliquis and denies missing any doses.   ED Course: pt was found to have troponin 0.04, INR 1.51, BNP 377, WBC 8.1, no tachycardia, no tachypnea, [place] cerebration 97% on room air, soft blood pressure, potassium 3.4, stable renal function, negative chest x-ray for acute abnormalities. Abnormal liver function with ALT 219, AST 185, ALT 145 and total bilirubin 2.2. Abdominal ultrasound showed cirrhosis without ascites, cholelithiasis without evidence of cholecystitis. Patient is placed on telemetry bed for  observation.  Review of Systems:   General: no fevers, chills, no changes in body weight, has fatigue HEENT: no blurry vision, hearing changes or sore throat Pulm: no dyspnea, coughing, wheezing CV: has chest pain, no palpitations Abd: no nausea, vomiting, abdominal pain, diarrhea, constipation GU: no dysuria, burning on urination, increased urinary frequency, hematuria  Ext: no leg edema Neuro: no unilateral weakness, numbness, or tingling, no vision change or hearing loss Skin: no rash MSK: No muscle spasm, no deformity, no limitation of range of movement in spin Heme: No easy bruising.  Travel history: No recent long distant travel.  Allergy: No Known Allergies  Past Medical History  Diagnosis Date  . Essential hypertension, benign   . CAD (coronary artery disease)     a. BMS to LAD 2001 at [facility] b. PTCA/atherectomy ramus and BMS to LAD 2009  . Chronic systolic heart failure ([place])   . GERD (gastroesophageal reflux disease)   . Paroxysmal atrial fibrillation (HCC)     a. on amiodarone, digoxin and Eliquis  . TIA (transient ischemic attack)   . HLD (hyperlipidemia)   . Orthostatic hypotension   . Dizziness     a. chronic. Admission for this 07/18/2014  . Ischemic cardiomyopathy     EF 18% by nuclear study 2016, multiple myocardial infarctions in past    . Obesity   . Hematuria   . History of blood transfusion     "I've had 2 units so far this year" (09/27/2015)  . Anemia   . SAH (subarachnoid hemorrhage) ([place])     post-traumatic (fall) [facility] 12/2014  .  PONV (postoperative nausea and vomiting)   . CHF (congestive heart failure) ([place])   . Myocardial infarction ([place]) 2001  . HCAP (healthcare-associated pneumonia) 07/21/2015  . OSA on CPAP   . Chronic kidney disease, stage IV (severe) ([place])   . DM type 2, uncontrolled, with renal complications ([place])   . Colostomy in place [facility])   . Adenocarcinoma of rectum ([place])     a. 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 Prostate cancer ([place])     a. s/p  seed implants with chemo and radiation    Past Surgical History  Procedure Laterality Date  . Cardiac defibrillator placement  2002  . Abdominal and perineal resection of rectum with total mesorectal excision  10/04/2007  . Colonoscopy  09/14/2011    Dr. Gala Romney: via colostomy, Single pedunculated benign inflammatory polyp. Due for surveillance Oct 2015  . Colostomy  2008  . Colonoscopy N/A 07/02/2014    Procedure: COLONOSCOPY;  Surgeon: Daneil Dolin, MD;  [facility];  Service: Endoscopy;  Laterality: N/A;  7:30 / COLONOSCOPY THRU COLOSTOMY  . Esophagogastroduodenoscopy N/A 07/02/2014    Procedure: ESOPHAGOGASTRODUODENOSCOPY (EGD);  Surgeon: Daneil Dolin, MD;  [facility];  Service: Endoscopy;  Laterality: N/A;  7:30  . Savory dilation N/A 07/02/2014    Procedure: SAVORY DILATION;  Surgeon: Daneil Dolin, MD;  [facility];  Service: Endoscopy;  Laterality: N/A;  7:30  . Maloney dilation N/A 07/02/2014    Procedure: Venia Minks DILATION;  Surgeon: Daneil Dolin, MD;  [facility];  Service: Endoscopy;  Laterality: N/A;  7:30  . Portacath placement  06/2007    "removed ~ 1 yr later"  . Left heart catheterization with coronary angiogram N/A 07/13/2013    Procedure: LEFT HEART CATHETERIZATION WITH CORONARY ANGIOGRAM;  Surgeon: Lorretta Harp, MD;  Location: [facility] CATH LAB;  Service: Cardiovascular;  Laterality: N/A;  . Colonoscopy N/A 12/11/2014    Dr. Gala Romney via colostomy. Normal. Repeat in 2021.   Marland Kitchen Esophagogastroduodenoscopy N/A 12/11/2014    [id number] EGD  . Cardiac defibrillator placement  2009    Upgraded to a BiV ICD  . Right heart catheterization N/A 02/24/2015    Procedure: RIGHT HEART CATH;  Surgeon: Jolaine Artist, MD;  Location: [facility] CATH LAB;  Service: Cardiovascular;  Laterality: N/A;  . Ep implantable device N/A 04/10/2015    Procedure: Ppm/Biv Ppm Generator Changeout;  Surgeon: Evans Lance, MD;  Location: [place] INVASIVE CV LAB CUPID;   Service: Cardiovascular;  Laterality: N/A;  . Bi-ventricular implantable cardioverter defibrillator  (crt-d)  2009  . Cardiac catheterization  08/2001; 2009    ; /notes 07/10/2013  . Coronary angioplasty with stent placement  2001; ~ 2006    "1 + 1"   . Givens capsule study N/A 07/23/2015    Procedure: GIVENS CAPSULE STUDY;  Surgeon: Wilford Corner, MD;  Location: [facility] ENDOSCOPY;  Service: Endoscopy;  Laterality: N/A;  . Colonoscopy N/A 08/24/2015    Procedure: COLONOSCOPY;  Surgeon: Manus Gunning, MD;  Location: [place];  Service: Gastroenterology;  Laterality: N/A;  . Electrophysiologic study N/A 08/28/2015    Procedure: AV Node Ablation;  Surgeon: Will Meredith Leeds, MD;  Location: [place] CV LAB;  Service: Cardiovascular;  Laterality: N/A;  . Av fistula placement Right 09/16/2015    Procedure: ARTERIOVENOUS (AV) FISTULA CREATION - BRACHIOCEPHALIC;  Surgeon: Elam Dutch, MD;  Location: [name];  Service: Vascular;  Laterality: Right;    Social History:  reports that  he has never smoked. He has never used smokeless tobacco. He reports that he does not drink alcohol or use illicit drugs.  Family History:  Family History  Problem Relation Age of Onset  . Colon cancer Mother 42  . Colon cancer Sister 1  . Coronary artery disease Father   . Colon cancer Other     2 cousins, succumbed to illness     Prior to Admission medications   Medication Sig Start Date End Date Taking? Authorizing Provider  apixaban (ELIQUIS) 5 MG TABS tablet Take 1 tablet (5 mg total) by mouth 2 (two) times daily. 04/26/16   Jolaine Artist, MD  atorvastatin (LIPITOR) 80 MG tablet Take 1 tablet (80 mg total) by mouth daily. 09/04/15   Larey Dresser, MD  co-enzyme Q-10 50 MG capsule Take 50 mg by mouth every morning.     Historical Provider, MD  ferrous sulfate 325 (65 FE) MG tablet Take 1 tablet (325 mg total) by mouth 2 (two) times daily with a meal. 07/24/15   Shanker Kristeen Mans, MD  fluticasone  (FLONASE) 50 MCG/ACT nasal spray Place 1 spray into both nostrils daily as needed for allergies.  11/21/13   Historical Provider, MD  metolazone (ZAROXOLYN) 2.5 MG tablet Take 1 tab weekly as needed for swelling Patient not taking: Reported on 05/07/2016 03/03/16   Arnoldo Lenis, MD  metoprolol succinate (TOPROL-XL) 25 MG 24 hr tablet Take 0.5 tablets (12.5 mg total) by mouth 2 (two) times daily. 04/12/16   Jolaine Artist, MD  Omega-3 Fatty Acids (FISH OIL) 1000 MG CAPS Take 1,000 mg by mouth 2 (two) times daily.    Historical Provider, MD  pantoprazole (PROTONIX) 40 MG tablet Take 1 tablet (40 mg total) by mouth daily. 05/07/16   Shirley Friar, PA-C  potassium chloride SA (K-DUR,KLOR-CON) 20 MEQ tablet Take 20 mEq by mouth daily.    Historical Provider, MD  PROAIR RESPICLICK [id number] (90 BASE) MCG/ACT AEPB Inhale 2 puffs into the lungs every 6 (six) hours as needed (for breathing).  08/15/15   Historical Provider, MD  torsemide (DEMADEX) 20 MG tablet Take 4 tablets by mouth every morning and 2 tables by mouth every evening 04/12/16   Amy D Clegg, NP  traMADol (ULTRAM) 50 MG tablet Take 1 tablet (50 mg total) by mouth every 6 (six) hours as needed. 03/14/16   Evalee Jefferson, PA-C    Physical Exam: Filed Vitals:   05/10/16 0130 05/10/16 0200 05/10/16 0249 05/10/16 0300  BP: 96/55 95/56 101/66 98/64  Pulse: 70 70 69 70  Resp: 18 13 16 13   Weight:      SpO2: 97% 97% 97% 99%   General: Not in acute distress HEENT:       Eyes: PERRL, EOMI, no scleral icterus.       ENT: No discharge from the ears and nose, no pharynx injection, no tonsillar enlargement.        Neck: No JVD, no bruit, no mass felt. Heme: No neck lymph node enlargement. Cardiac: S1/S2, RRR, No murmurs, No gallops or rubs. Pulm:  No rales, wheezing, rhonchi or rubs. Abd: Soft, nondistended, nontender, no rebound pain, no organomegaly, BS present. Has colostomy with clean clean surroundings  GU: No hematuria Ext: No pitting leg  edema bilaterally. 2+DP/PT pulse bilaterally. Musculoskeletal: No joint deformities, No joint redness or warmth, no limitation of ROM in spin. Skin: No rashes.  Neuro: Alert, oriented X3, cranial nerves II-XII grossly intact, moves all extremities  normally. Psych: Patient is not psychotic, no suicidal or hemocidal ideation.  Labs on Admission: I have personally reviewed following labs and imaging studies  CBC:  Recent Labs Lab 05/09/16 2343  WBC 8.1  NEUTROABS 6.2  HGB 11.1*  HCT 33.8*  MCV 96.3  PLT [id number]*   Basic Metabolic Panel:  Recent Labs Lab 05/07/16 1045 05/09/16 2343  NA 136 134*  K 3.7 3.4*  CL 99* 98*  CO2 28 25  GLUCOSE 280* 305*  BUN 40* 41*  CREATININE 2.59* 2.79*  CALCIUM 9.3 9.7   GFR: Estimated Creatinine Clearance: 28.7 mL/min (by C-G formula based on Cr of 2.79). Liver Function Tests:  Recent Labs Lab 05/09/16 2343  AST 185*  ALT 141*  ALKPHOS 219*  BILITOT 2.2*  PROT 7.6  ALBUMIN 3.2*    Recent Labs Lab 05/09/16 2343  LIPASE 49    Recent Labs Lab 05/10/16 0140  AMMONIA 21   Coagulation Profile:  Recent Labs Lab 05/10/16 0140  INR 1.51*   Cardiac Enzymes:  Recent Labs Lab 05/09/16 2343  TROPONINI 0.04*   BNP (last 3 results) No results for input(s): PROBNP in the last 8760 hours. HbA1C: No results for input(s): HGBA1C in the last 72 hours. CBG: No results for input(s): GLUCAP in the last 168 hours. Lipid Profile: No results for input(s): CHOL, HDL, LDLCALC, TRIG, CHOLHDL, LDLDIRECT in the last 72 hours. Thyroid Function Tests: No results for input(s): TSH, T4TOTAL, FREET4, T3FREE, THYROIDAB in the last 72 hours. Anemia Panel: No results for input(s): VITAMINB12, FOLATE, FERRITIN, TIBC, IRON, RETICCTPCT in the last 72 hours. Urine analysis:    Component Value Date/Time   COLORURINE YELLOW 08/23/2015 0322   APPEARANCEUR CLOUDY* 08/23/2015 0322   LABSPEC 1.015 08/23/2015 0322   PHURINE 5.0 08/23/2015 0322    GLUCOSEU NEGATIVE 08/23/2015 0322   HGBUR NEGATIVE 08/23/2015 0322   BILIRUBINUR NEGATIVE 08/23/2015 0322   KETONESUR NEGATIVE 08/23/2015 0322   PROTEINUR 30* 08/23/2015 0322   UROBILINOGEN 1.0 08/23/2015 0322   NITRITE NEGATIVE 08/23/2015 0322   LEUKOCYTESUR NEGATIVE 08/23/2015 0322   Sepsis Labs: @LABRCNTIP (procalcitonin:4,lacticidven:4) )No results found for this or any previous visit (from the past 240 hour(s)).   Radiological Exams on Admission: Dg Chest 2 View  05/10/2016  CLINICAL DATA:  60 year old male with right chest pain. EXAM: CHEST  2 VIEW COMPARISON:  Radiograph dated 09/30/2015 FINDINGS: Two views of the chest demonstrate clear lungs with sharp costophrenic angles. There is no pneumothorax. Stable mild cardiomegaly. Left pectoral AICD device. There is osteopenia with degenerative changes of the spine. No acute fracture identified. IMPRESSION: No active cardiopulmonary disease. Electronically Signed   By: Anner Crete M.D.   On: 05/10/2016 00:33   US Abdomen Complete  05/10/2016  CLINICAL DATA:  Upper abdominal pain.  Elevated LFTs. EXAM: ABDOMEN ULTRASOUND COMPLETE COMPARISON:  Abdominal CT 05/05/2016 FINDINGS: Gallbladder: Cholelithiasis. There is gallbladder wall thickening which is attributed to underdistention. No focal tenderness or pericholecystic inflammation. Common bile duct: Diameter: 4 mm. Where visualized, no filling defect. Liver: Coarsened echogenic liver correlating with cirrhotic findings on previous CT. No focal finding. Antegrade flow in the imaged portal venous system. No ascites. IVC: No abnormality visualized. Pancreas: Obscured.  No significant finding on recent CT. Spleen: Size and appearance within normal limits. Right Kidney: Length: 10 cm. Echogenicity within normal limits. No mass or hydronephrosis visualized. Left Kidney: Length: 10 cm. Echogenicity within normal limits. No mass or hydronephrosis visualized. Abdominal aorta: No aneurysm visualized.  IMPRESSION: 1. Cholelithiasis  without acute cholecystitis. 2. Cirrhosis without ascites. 3. Obscured pancreas. Electronically Signed   By: Monte Fantasia M.D.   On: 05/10/2016 02:49     EKG: Independently reviewed.  QTC 536, ventricular paced rhythm   Assessment/Plan Principal Problem:   Chest pain Active Problems:   History of rectal cancer   Ischemic cardiomyopathy-EF 35% 04/24/15 echo   Long term current use of anticoagulant therapy   CAD with LCX disease and stents to LAD   Chronic systolic heart failure (HCC)   Paroxysmal atrial fibrillation (HCC)   Chronic hypotension   Chronic kidney disease, stage IV (severe) (HCC)   DM type 2, uncontrolled, with renal complications (HCC)   GERD (gastroesophageal reflux disease)   OSA on CPAP   Hyperlipidemia   BiV ICD (St Jude) gen change 04/10/15   Chronic cholecystitis   NSTEMI- 04/23/15- Myoview scar- no cath   Transaminitis   Atrial fibrillation, chronic (HCC)   Chest pain, CAD, chronic elevation of trop: s/p stent. Pt has chronic mild elevation of trop (0.04 to 0.05). Today his trop is 0.04 which is at his baseline. His chest pain has completely resolved now. Chest x-ray is negative for any pneumonia. Patient is on Eliquis, no missing dose, less likely to have pulmonary embolism.   - will admit to Tele bed for obs - cycle CE q6 x3 and repeat her EKG in the am  - prn Nitroglycerin, Morphine, metoprolol and lipitor  - Risk factor stratification: will check FLP and A1C  - 2d echo - please call Card in AM  Chronic systolic HEART failure: 2-D echo on 04/24/15 showed EF 35%. Patient is taking torsemide currently. BNP slightly elevated at 377, but no leg edema or JVD. CHF is compensated. -Continue torsemide, metoprolol  Atrial Fibrillation: CHA2DS2-VASc Score is 4, needs oral anticoagulation. Patient is on Eliquis at home. INR is 1.51 on admission. Heart rate is well controlled. -continue metoprolol and Eliquis  Chronic hypotension:  Blood pressure is soft, 93/53, which is at baseline. Mental status normal. No signs of infection. -Observe closely  Chronic kidney disease, stage IV (severe) ([place]): Stable. Baseline creatinine 2.5-3.0. His creatinine is 2.79, BUN 41 on admission. -Follow-up renal function by BMP  DM-II: Last A1c 6.9, well controled. Patient is taking Lantus at home. Blood sugar is 305-->recieved 10 units of insulin in ED. -will decrease Lantus dose from  30--> 20 units daily -SSI -Check A1c  GERD: -Protonix  OSA: -CPAP  HLD: Last LDL was 41 on 03/18/16  -Continue home medications: Lipitor  Chronic cholecystitis: has elevated LFT. US showed cholelithiasis, but no signs of acute could be cystitis. No nausea, vomiting, abdominal pain -observe closely -Avoid using Tylenol   DVT ppx: On Eliquis Code Status: Full code Family Communication: None at bed side.  Disposition Plan:  Anticipate discharge back to previous home environment Consults called:  none Admission status: Obs / tele   Date of Service 05/10/2016    Ivor Costa [facility] Pager [phone]  If 7PM-7AM, please contact night-coverage [email or web address] Password [facility] 05/10/2016, 3:43 AM

## 2016-05-10 NOTE — Progress Notes (Signed)
Patient refused use of CPAP. RT will continue to monitor.  

## 2016-05-10 NOTE — ED Notes (Signed)
Attempted to call report

## 2016-05-10 NOTE — ED Notes (Signed)
Pt returned from US

## 2016-05-10 NOTE — Consult Note (Signed)
   [facility] CM Inpatient Consult   05/10/2016  [name] 04/20/1956 [id number]  Patient was assessed for [facility] for community services. Patient was previously active with [facility]. Patient  is a 60 y.o. male with medical history significant of CAD, MI, s/p of stent, chronic mild elevation of troponin, PAF on Eliquis, hyperlipidemia, diabetes mellitus, GERD, ICD placement, TIA, SDH, sCHF with EF 35%, OSA on CPAP, CKD-IV, rectal cancer (s/p of colotomy), prostate cancer (s/p of seed with chemotherapy and XRT), chronic cholecystitis, chronic hypotension, who presents with chest pain per electronic medical record. Met with patient at bedside regarding being restarted with [facility] services. Patient states he had refused his test this morning but was waiting to get it re-scheduled.  He has been active also with the HF clinic.  He states he would mind follow up when he gets home.   Consent form signed and already on file.  Gave the patient a [facility] Care Management brochure and contact information. Patient endorses Dr. Redmond School as his primary care provider.   Of note, [facility] Care Management services does not replace or interfere with any services that are arranged by inpatient case management or social work. For additional questions or referrals please contact:  Natividad Brood, RN BSN [facility] Liaison  [phone] business mobile phone Toll free office [phone]

## 2016-05-10 NOTE — Consult Note (Signed)
[facility] Surgery Consult Note  [name] 10-Nov-1956  [id number].    Requesting MD: Dr. Ivor Costa Chief Complaint/Reason for Consult: Cholelithiasis  HPI:  60 y/o male with multiple medical problems incliding CAD, MI s/p stent, chronic mild elevation of troponin, a.fib s/p AV nodal ablation 2016 on Eliquis, HL, DM, GERD, ICD placement, TIA, SDH, sCHF with EF 35%, OSA on CPAP, CKD-IV, rectal cancer (s/p colectomy colostomy), prostate cancer (s/p of seed with chemotherapy and XRT), chronic cholecystitis, chronic hypotension, who presented to the ED late 05/09/16 with chest pain. Pain described as sharp, non-radiating, and epigastric. "different" than the pain he associates with MI. He denies pain today. He was at his son's house when the pain came on and pain spontaneously resolved within one hour. He denies CP, SOB, substernal pressure, nausea, vomiting, diarrhea, hematochezia, dysuria, hematuria. Pt takes Eliquis regularly without missing doses.  U/S examination in ED revealed cholelithiasis without cholecystitis. Pt states that he was here a year ago with "gallbladder problems" and his cardiologist Dr. Tempie Hoist told him he was not a surgical candidate.   ROS: All systems reviewed and otherwise negative except for as above  Family History  Problem Relation Age of Onset  . Colon cancer Mother 65  . Colon cancer Sister 60  . Coronary artery disease Father   . Colon cancer Other     2 cousins, succumbed to illness    Past Medical History  Diagnosis Date  . Essential hypertension, benign   . CAD (coronary artery disease)     a. BMS [facility] LAD 2001 [facility] b. PTCA/atherectomy ramus and BMS to LAD 2009  . Chronic systolic heart failure ([place])   . GERD (gastroesophageal reflux disease)   . Paroxysmal atrial fibrillation (HCC)     a. on amiodarone, digoxin and Eliquis  . TIA (transient ischemic attack)   . HLD (hyperlipidemia)   . Orthostatic hypotension   . Dizziness     a.  chronic. Admission for this 07/18/2014  . Ischemic cardiomyopathy     EF 18% by nuclear study 2016, multiple myocardial infarctions in past    . Obesity   . Hematuria   . History of blood transfusion     "I've had 2 units so far this year" (09/27/2015)  . Anemia   . SAH (subarachnoid hemorrhage) ([place])     post-traumatic (fall) [facility] 12/2014  . PONV (postoperative nausea and vomiting)   . CHF (congestive heart failure) ([place])   . Myocardial infarction ([place]) 2001  . HCAP (healthcare-associated pneumonia) 07/21/2015  . OSA on CPAP   . Chronic kidney disease, stage IV (severe) ([place])   . DM type 2, uncontrolled, with renal complications ([place])   . Colostomy in place [facility])   . Adenocarcinoma of rectum ([place])     a. 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 Prostate cancer ([place])     a. s/p seed implants with chemo and radiation    Past Surgical History  Procedure Laterality Date  . Cardiac defibrillator placement  2002  . Abdominal and perineal resection of rectum with total mesorectal excision  10/04/2007  . Colonoscopy  09/14/2011    Dr. Gala Romney: via colostomy, Single pedunculated benign inflammatory polyp. Due for surveillance Oct 2015  . Colostomy  2008  . Colonoscopy N/A 07/02/2014    Procedure: COLONOSCOPY;  Surgeon: Daneil Dolin, MD;  [facility];  Service: Endoscopy;  Laterality: N/A;  7:30 / COLONOSCOPY THRU COLOSTOMY  . Esophagogastroduodenoscopy N/A 07/02/2014    Procedure:  ESOPHAGOGASTRODUODENOSCOPY (EGD);  Surgeon: Daneil Dolin, MD;  [facility];  Service: Endoscopy;  Laterality: N/A;  7:30  . Savory dilation N/A 07/02/2014    Procedure: SAVORY DILATION;  Surgeon: Daneil Dolin, MD;  [facility];  Service: Endoscopy;  Laterality: N/A;  7:30  . Maloney dilation N/A 07/02/2014    Procedure: Venia Minks DILATION;  Surgeon: Daneil Dolin, MD;  [facility];  Service: Endoscopy;  Laterality: N/A;  7:30  . Portacath placement  06/2007    "removed ~ 1 yr later"  .  Left heart catheterization with coronary angiogram N/A 07/13/2013    Procedure: LEFT HEART CATHETERIZATION WITH CORONARY ANGIOGRAM;  Surgeon: Lorretta Harp, MD;  Location: [facility] CATH LAB;  Service: Cardiovascular;  Laterality: N/A;  . Colonoscopy N/A 12/11/2014    Dr. Gala Romney via colostomy. Normal. Repeat in 2021.   Marland Kitchen Esophagogastroduodenoscopy N/A 12/11/2014    FAO:ZHYQMV EGD  . Cardiac defibrillator placement  2009    Upgraded to a BiV ICD  . Right heart catheterization N/A 02/24/2015    Procedure: RIGHT HEART CATH;  Surgeon: Jolaine Artist, MD;  Location: [facility] CATH LAB;  Service: Cardiovascular;  Laterality: N/A;  . Ep implantable device N/A 04/10/2015    Procedure: Ppm/Biv Ppm Generator Changeout;  Surgeon: Evans Lance, MD;  Location: [place] INVASIVE CV LAB CUPID;  Service: Cardiovascular;  Laterality: N/A;  . Bi-ventricular implantable cardioverter defibrillator  (crt-d)  2009  . Cardiac catheterization  08/2001; 2009    ; /notes 07/10/2013  . Coronary angioplasty with stent placement  2001; ~ 2006    "1 + 1"   . Givens capsule study N/A 07/23/2015    Procedure: GIVENS CAPSULE STUDY;  Surgeon: Wilford Corner, MD;  Location: [facility] ENDOSCOPY;  Service: Endoscopy;  Laterality: N/A;  . Colonoscopy N/A 08/24/2015    Procedure: COLONOSCOPY;  Surgeon: Manus Gunning, MD;  Location: [place];  Service: Gastroenterology;  Laterality: N/A;  . Electrophysiologic study N/A 08/28/2015    Procedure: AV Node Ablation;  Surgeon: Will Meredith Leeds, MD;  Location: [place] CV LAB;  Service: Cardiovascular;  Laterality: N/A;  . Av fistula placement Right 09/16/2015    Procedure: ARTERIOVENOUS (AV) FISTULA CREATION - BRACHIOCEPHALIC;  Surgeon: Elam Dutch, MD;  Location: [place];  Service: Vascular;  Laterality: Right;    Social History:  reports that he has never smoked. He has never used smokeless tobacco. He reports that he does not drink alcohol or use illicit drugs.  Allergies: No Known  Allergies  Medications Prior to Admission  Medication Sig Dispense Refill  . apixaban (ELIQUIS) 5 MG TABS tablet Take 1 tablet (5 mg total) by mouth 2 (two) times daily. 60 tablet 3  . atorvastatin (LIPITOR) 80 MG tablet Take 1 tablet (80 mg total) by mouth daily. 30 tablet 6  . co-enzyme Q-10 50 MG capsule Take 50 mg by mouth every morning.     . ferrous sulfate 325 (65 FE) MG tablet Take 1 tablet (325 mg total) by mouth 2 (two) times daily with a meal. 60 tablet 0  . fluticasone (FLONASE) 50 MCG/ACT nasal spray Place 1 spray into both nostrils daily as needed for allergies.     Marland Kitchen insulin glargine (LANTUS) 100 UNIT/ML injection Inject 30 Units into the skin every morning.    . metoprolol succinate (TOPROL-XL) 25 MG 24 hr tablet Take 0.5 tablets (12.5 mg total) by mouth 2 (two) times daily. 45 tablet 3  .  nitroGLYCERIN (NITROLINGUAL) 0.4 MG/SPRAY spray Place 1 spray under the tongue every 5 (five) minutes x 3 doses as needed for chest pain.    . Omega-3 Fatty Acids (FISH OIL) 1000 MG CAPS Take 1,000 mg by mouth 2 (two) times daily.    . pantoprazole (PROTONIX) 40 MG tablet Take 1 tablet (40 mg total) by mouth daily. 30 tablet 0  . potassium chloride SA (K-DUR,KLOR-CON) 20 MEQ tablet Take 20 mEq by mouth daily.    Marland Kitchen PROAIR RESPICLICK 166 (90 BASE) MCG/ACT AEPB Inhale 2 puffs into the lungs every 6 (six) hours as needed (for breathing).   0  . torsemide (DEMADEX) 20 MG tablet Take 4 tablets by mouth every morning and 2 tables by mouth every evening 180 tablet 3  . traMADol (ULTRAM) 50 MG tablet Take 1 tablet (50 mg total) by mouth every 6 (six) hours as needed. 15 tablet 0    Blood pressure 103/61, pulse 71, temperature 98 F (36.7 C), temperature source Oral, resp. rate 18, height _0  (1.778 m), weight 86.728 kg (191 lb 3.2 oz), SpO2 100 %. Physical Exam: General: pleasant, obese white male sitting up in chair in NAD HEENT: head is normocephalic, atraumatic.  Scleral icterus appreciated.   PERRL. Mouth is pink and moist Heart: regular, rate, and rhythm.  No obvious murmurs, gallops, or rubs noted. Lungs: CTABL  Respiratory effort nonlabored Abd: soft, NT/ND, +BS, colostomy bag in place with soft stool output; negative murphy's sign Ext: now LE edema, pedal pulses palpable Neuro: A&Ox3, normal speech  Results for orders placed or performed during the hospital encounter of 05/09/16 (from the past 48 hour(s))  CBC with Differential/Platelet     Status: Abnormal   Collection Time: 05/09/16 11:43 PM  Result Value Ref Range   WBC 8.1 4.0 - 10.5 K/uL   RBC 3.51 (L) 4.22 - 5.81 MIL/uL   Hemoglobin 11.1 (L) 13.0 - 17.0 g/dL   HCT 33.8 (L) 39.0 - 52.0 %   MCV 96.3 78.0 - 100.0 fL   MCH 31.6 26.0 - 34.0 pg   MCHC 32.8 30.0 - 36.0 g/dL   RDW 13.8 11.5 - 15.5 %   Platelets 145 (L) 150 - 400 K/uL   Neutrophils Relative % 77 %   Neutro Abs 6.2 1.7 - 7.7 K/uL   Lymphocytes Relative 14 %   Lymphs Abs 1.1 0.7 - 4.0 K/uL   Monocytes Relative 7 %   Monocytes Absolute 0.6 0.1 - 1.0 K/uL   Eosinophils Relative 2 %   Eosinophils Absolute 0.2 0.0 - 0.7 K/uL   Basophils Relative 0 %   Basophils Absolute 0.0 0.0 - 0.1 K/uL  Comprehensive metabolic panel     Status: Abnormal   Collection Time: 05/09/16 11:43 PM  Result Value Ref Range   Sodium 134 (L) 135 - 145 mmol/L   Potassium 3.4 (L) 3.5 - 5.1 mmol/L   Chloride 98 (L) 101 - 111 mmol/L   CO2 25 22 - 32 mmol/L   Glucose, Bld 305 (H) 65 - 99 mg/dL   BUN 41 (H) 6 - 20 mg/dL   Creatinine, Ser 2.79 (H) 0.61 - 1.24 mg/dL   Calcium 9.7 8.9 - 10.3 mg/dL   Total Protein 7.6 6.5 - 8.1 g/dL   Albumin 3.2 (L) 3.5 - 5.0 g/dL   AST 185 (H) 15 - 41 U/L   ALT 141 (H) 17 - 63 U/L   Alkaline Phosphatase 219 (H) 38 - 126 U/L   Total  Bilirubin 2.2 (H) 0.3 - 1.2 mg/dL   GFR calc non Af Amer 23 (L) >60 mL/min   GFR calc Af Amer 27 (L) >60 mL/min    Comment: (NOTE) The eGFR has been calculated using the CKD EPI equation. This calculation has not  been validated in all clinical situations. eGFR's persistently <60 mL/min signify possible Chronic Kidney Disease.    Anion gap 11 5 - 15  Lipase, blood     Status: None   Collection Time: 05/09/16 11:43 PM  Result Value Ref Range   Lipase 49 11 - 51 U/L  Troponin I     Status: Abnormal   Collection Time: 05/09/16 11:43 PM  Result Value Ref Range   Troponin I 0.04 (H) <0.031 ng/mL    Comment:        PERSISTENTLY INCREASED TROPONIN VALUES IN THE RANGE OF 0.04-0.49 ng/mL CAN BE SEEN IN:       -UNSTABLE ANGINA       -CONGESTIVE HEART FAILURE       -MYOCARDITIS       -CHEST TRAUMA       -ARRYHTHMIAS       -LATE PRESENTING MYOCARDIAL INFARCTION       -COPD   CLINICAL FOLLOW-UP RECOMMENDED.   Brain natriuretic peptide     Status: Abnormal   Collection Time: 05/09/16 11:43 PM  Result Value Ref Range   B Natriuretic Peptide 377.6 (H) 0.0 - 100.0 pg/mL  Ammonia     Status: None   Collection Time: 05/10/16  1:40 AM  Result Value Ref Range   Ammonia 21 9 - 35 umol/L  Protime-INR     Status: Abnormal   Collection Time: 05/10/16  1:40 AM  Result Value Ref Range   Prothrombin Time 18.3 (H) 11.6 - 15.2 seconds   INR 1.51 (H) 0.00 - 1.49  Magnesium     Status: Abnormal   Collection Time: 05/10/16  4:15 AM  Result Value Ref Range   Magnesium 2.5 (H) 1.7 - 2.4 mg/dL  Troponin I     Status: Abnormal   Collection Time: 05/10/16  4:15 AM  Result Value Ref Range   Troponin I 0.04 (H) <0.031 ng/mL    Comment:        PERSISTENTLY INCREASED TROPONIN VALUES IN THE RANGE OF 0.04-0.49 ng/mL CAN BE SEEN IN:       -UNSTABLE ANGINA       -CONGESTIVE HEART FAILURE       -MYOCARDITIS       -CHEST TRAUMA       -ARRYHTHMIAS       -LATE PRESENTING MYOCARDIAL INFARCTION       -COPD   CLINICAL FOLLOW-UP RECOMMENDED.   Lipid panel     Status: Abnormal   Collection Time: 05/10/16  4:15 AM  Result Value Ref Range   Cholesterol 163 0 - 200 mg/dL   Triglycerides 96 <150 mg/dL   HDL 27 (L) >40  mg/dL   Total CHOL/HDL Ratio 6.0 RATIO   VLDL 19 0 - 40 mg/dL   LDL Cholesterol 117 (H) 0 - 99 mg/dL    Comment:        Total Cholesterol/HDL:CHD Risk Coronary Heart Disease Risk Table                     Men   Women  1/2 Average Risk   3.4   3.3  Average Risk       5.0   4.4  2 X Average Risk   9.6   7.1  3 X Average Risk  23.4   11.0        Use the calculated Patient Ratio above and the CHD Risk Table to determine the patient's CHD Risk.        ATP III CLASSIFICATION (LDL):  <100     mg/dL   Optimal  100-129  mg/dL   Near or Above                    Optimal  130-159  mg/dL   Borderline  160-189  mg/dL   High  >190     mg/dL   Very High   Basic metabolic panel     Status: Abnormal   Collection Time: 05/10/16  4:15 AM  Result Value Ref Range   Sodium 136 135 - 145 mmol/L   Potassium 3.4 (L) 3.5 - 5.1 mmol/L   Chloride 102 101 - 111 mmol/L   CO2 24 22 - 32 mmol/L   Glucose, Bld 245 (H) 65 - 99 mg/dL   BUN 42 (H) 6 - 20 mg/dL   Creatinine, Ser 2.65 (H) 0.61 - 1.24 mg/dL   Calcium 9.2 8.9 - 10.3 mg/dL   GFR calc non Af Amer 24 (L) >60 mL/min   GFR calc Af Amer 28 (L) >60 mL/min    Comment: (NOTE) The eGFR has been calculated using the CKD EPI equation. This calculation has not been validated in all clinical situations. eGFR's persistently <60 mL/min signify possible Chronic Kidney Disease.    Anion gap 10 5 - 15  Glucose, capillary     Status: Abnormal   Collection Time: 05/10/16  7:16 AM  Result Value Ref Range   Glucose-Capillary 184 (H) 65 - 99 mg/dL  Troponin I     Status: Abnormal   Collection Time: 05/10/16  8:29 AM  Result Value Ref Range   Troponin I 0.04 (H) <0.031 ng/mL    Comment:        PERSISTENTLY INCREASED TROPONIN VALUES IN THE RANGE OF 0.04-0.49 ng/mL CAN BE SEEN IN:       -UNSTABLE ANGINA       -CONGESTIVE HEART FAILURE       -MYOCARDITIS       -CHEST TRAUMA       -ARRYHTHMIAS       -LATE PRESENTING MYOCARDIAL INFARCTION       -COPD    CLINICAL FOLLOW-UP RECOMMENDED.   Hepatic function panel     Status: Abnormal   Collection Time: 05/10/16  8:29 AM  Result Value Ref Range   Total Protein 7.6 6.5 - 8.1 g/dL   Albumin 3.1 (L) 3.5 - 5.0 g/dL   AST 579 (H) 15 - 41 U/L   ALT 352 (H) 17 - 63 U/L   Alkaline Phosphatase 218 (H) 38 - 126 U/L   Total Bilirubin 3.2 (H) 0.3 - 1.2 mg/dL   Bilirubin, Direct 1.9 (H) 0.1 - 0.5 mg/dL   Indirect Bilirubin 1.3 (H) 0.3 - 0.9 mg/dL  CK     Status: Abnormal   Collection Time: 05/10/16  8:29 AM  Result Value Ref Range   Total CK 38 (L) 49 - 397 U/L   Dg Chest 2 View  05/10/2016  CLINICAL DATA:  60 year old male with right chest pain. EXAM: CHEST  2 VIEW COMPARISON:  Radiograph dated 09/30/2015 FINDINGS: Two views of the chest demonstrate clear lungs with sharp costophrenic angles. There is no  pneumothorax. Stable mild cardiomegaly. Left pectoral AICD device. There is osteopenia with degenerative changes of the spine. No acute fracture identified. IMPRESSION: No active cardiopulmonary disease. Electronically Signed   By: Anner Crete M.D.   On: 05/10/2016 00:33   US Abdomen Complete  05/10/2016  CLINICAL DATA:  Upper abdominal pain.  Elevated LFTs. EXAM: ABDOMEN ULTRASOUND COMPLETE COMPARISON:  Abdominal CT 05/05/2016 FINDINGS: Gallbladder: Cholelithiasis. There is gallbladder wall thickening which is attributed to underdistention. No focal tenderness or pericholecystic inflammation. Common bile duct: Diameter: 4 mm. Where visualized, no filling defect. Liver: Coarsened echogenic liver correlating with cirrhotic findings on previous CT. No focal finding. Antegrade flow in the imaged portal venous system. No ascites. IVC: No abnormality visualized. Pancreas: Obscured.  No significant finding on recent CT. Spleen: Size and appearance within normal limits. Right Kidney: Length: 10 cm. Echogenicity within normal limits. No mass or hydronephrosis visualized. Left Kidney: Length: 10 cm. Echogenicity  within normal limits. No mass or hydronephrosis visualized. Abdominal aorta: No aneurysm visualized. IMPRESSION: 1. Cholelithiasis without acute cholecystitis. 2. Cirrhosis without ascites. 3. Obscured pancreas. Electronically Signed   By: Monte Fantasia M.D.   On: 05/10/2016 02:49      Assessment/Plan Principal Problem:  Chest pain - Troponins WNL for patient (.04-.05); no EKG changes. Active Problems: History of rectal cancer (Abdominal and Perineal Resection of Rectum with Total Mesorectal Excision, colostomy by Dr. Dalbert Batman in 2008)  Ischemic cardiomyopathy-EF 35% 04/24/15 echo  Long term current use of anticoagulant therapy  CAD with LCX disease and stents to LAD - last nuc 2016 without changes  Chronic systolic heart failure (HCC)  Paroxysmal atrial fibrillation (HCC)  Chronic hypotension  Chronic kidney disease, stage IV (severe) ([place])  DM type 2, uncontrolled, with renal complications (HCC)  GERD (gastroesophageal reflux disease)  OSA on CPAP  Hyperlipidemia  BiV ICD ([employer]) gen change 04/10/15  Chronic cholecystitis  NSTEMI- 04/23/15- Myoview scar- no cath  Transaminitis  Atrial fibrillation, chronic ([place]) - on eliquis   Cholelithiasis without acute cholecystitis - Chronically elevated LFT's since 09/2015; AST 185, ALT 141, Alk Phos 219, total bili 2.2 (labs on 05/09/16) - afebrile, no tachycardia, no leukocytosis - HIDA scan; possible PERC drainage. - cardiac dispo: "He would be of moderate to high risk from a cardiac perspective for surgery given his underlying ischemic cardiomyopathy, chronic systolic heart failure however if risk to his life is high enough without surgical intervention, he may proceed. " If surgery is requires, hold Eliquis 2-3 days before operation.     Jill Alexanders, [facility] Surgery 05/10/2016, 11:22 AM Pager: [phone] Mon-Fri 7:00 am-4:30 pm Sat-Sun 7:00 am-11:30 am

## 2016-05-10 NOTE — Consult Note (Signed)
Reason for Consult: chest pain possible pre-op exam.   Referring Physician:   PCP:  Glo Herring., MD  Primary Cardiologist:Dr. Harl Bowie EP: Dr. Lovena Le CHF: Dr. Haroldine Laws.   [name] is an 60 y.o. male.    Chief Complaint: chest pain, admitted    HPI: Asked to see 60 year old male with hx. CAD with remote Ant. MI and BMS to LAD in 2001, and 2009 with another BMS.  EF by echo 04/2015 35% ischemic cardiomyopathy,  he has CRT-D,  hx of V fib arrest with ICD shock in 2016.  Last cath 07/2013 "Ischemic myopathy with a widely patent proximal LAD date and moderate segmental calcified proximal LAD stenosis. The only change in his anatomy from 5 years ago was occlusion of the high first diagonal branch which was severely diffusely diseased previously. There are no "culprit vessels. The patient is already on maximal medical therapy. Plans will be continued medical therapy"   Last Rt heart cath 02/2015 with relatively normal hemodynamics.  Nuc 04/2015 with NSTEMI and CKD was elevated  "Myoview showed EF 20% with large region of prior infarct involving the apical anterior, anteroseptal, inferoseptal and inferior walls with extension to the true apex. Small region of reversible/inducible ischemia along the margin of the prior infarct at the mid ventricular anterior and anteroseptal walls. No change from previous"  Also with a fib/flutter. With hx AV nodal ablation 08/2015-on Eliquis-. OSA with CPAP. CKD stage 4 followed by Dr. Hinda Lenis with AV fistula in place.  Last seen by Dr. Haroldine Laws  05/07/16.   Now admitted6/5/17 early AM with chest apin that began around 9:30 pm. It is located in the diaphragmatic area"just under the ribs" , constant, sharp, 10 out of 10 in severity, nonradiating. It is aggravated by deep breath and reclining. He used NTG spray without immediate improvement, but he noted that it resolved spontaneously while en route to the hospital. It lasted for about 1 hour. Pt stated  that he has had similar pain in the past, but it typically resolves after 15 minutes. The pain felt different than pain that he has had in the past with heart attacks. He does not believ it to be his heart.  Patient denies shortness of breath, cough, nausea, vomiting, diarrhea, abdominal pain.  He does not have leg edema. Pt is on eliquis and denies missing any doses.    Troponin has been flat at 0.04 X 3.  EKG appears to be underlying ? a fib with V pacing. With pacer check in 03/2016 he was in [place]. But mostly has been in a fib. No acute changes.  Cr. 2.79 to 2.65. AST at 579 this AM up from 185 and ALT is now 352 up from 141. T Bili 1.9, indirect Bili 1.3 and Total 3.2 up from 2.2 last pm.   CXR with clear lungs no cardiopulmonary issues.  CT abd: done last week Morphologic changes in the liver suggestive of underlying Cirrhosis. Also coronary calcifications were noted.  ABD u/s with gall stone but not acute chololithiasis.   Currently:  No pain.  He feels well.  Dr. Allyson Sabal in room as well and will notify surgeons of possible stone causing elevated LFTs.    Past Medical History  Diagnosis Date  . Essential hypertension, benign   . CAD (coronary artery disease)     a. BMS [facility] LAD 2001 at [facility] b. PTCA/atherectomy ramus and BMS to LAD 2009  . Chronic  systolic heart failure ([place])   . GERD (gastroesophageal reflux disease)   . Paroxysmal atrial fibrillation (HCC)     a. on amiodarone, digoxin and Eliquis  . TIA (transient ischemic attack)   . HLD (hyperlipidemia)   . Orthostatic hypotension   . Dizziness     a. chronic. Admission for this 07/18/2014  . Ischemic cardiomyopathy     EF 18% by nuclear study 2016, multiple myocardial infarctions in past    . Obesity   . Hematuria   . History of blood transfusion     "I've had 2 units so far this year" (09/27/2015)  . Anemia   . SAH (subarachnoid hemorrhage) ([place])     post-traumatic (fall) [facility] 12/2014  . PONV (postoperative nausea and vomiting)    . CHF (congestive heart failure) ([place])   . Myocardial infarction ([place]) 2001  . HCAP (healthcare-associated pneumonia) 07/21/2015  . OSA on CPAP   . Chronic kidney disease, stage IV (severe) ([place])   . DM type 2, uncontrolled, with renal complications ([place])   . Colostomy in place [facility])   . Adenocarcinoma of rectum ([place])     a. 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 Prostate cancer ([place])     a. s/p seed implants with chemo and radiation    Past Surgical History  Procedure Laterality Date  . Cardiac defibrillator placement  2002  . Abdominal and perineal resection of rectum with total mesorectal excision  10/04/2007  . Colonoscopy  09/14/2011    Dr. Gala Romney: via colostomy, Single pedunculated benign inflammatory polyp. Due for surveillance Oct 2015  . Colostomy  2008  . Colonoscopy N/A 07/02/2014    Procedure: COLONOSCOPY;  Surgeon: Daneil Dolin, MD;  [facility];  Service: Endoscopy;  Laterality: N/A;  7:30 / COLONOSCOPY THRU COLOSTOMY  . Esophagogastroduodenoscopy N/A 07/02/2014    Procedure: ESOPHAGOGASTRODUODENOSCOPY (EGD);  Surgeon: Daneil Dolin, MD;  [facility];  Service: Endoscopy;  Laterality: N/A;  7:30  . Savory dilation N/A 07/02/2014    Procedure: SAVORY DILATION;  Surgeon: Daneil Dolin, MD;  [facility];  Service: Endoscopy;  Laterality: N/A;  7:30  . Maloney dilation N/A 07/02/2014    Procedure: Venia Minks DILATION;  Surgeon: Daneil Dolin, MD;  [facility];  Service: Endoscopy;  Laterality: N/A;  7:30  . Portacath placement  06/2007    "removed ~ 1 yr later"  . Left heart catheterization with coronary angiogram N/A 07/13/2013    Procedure: LEFT HEART CATHETERIZATION WITH CORONARY ANGIOGRAM;  Surgeon: Lorretta Harp, MD;  Location: [facility] CATH LAB;  Service: Cardiovascular;  Laterality: N/A;  . Colonoscopy N/A 12/11/2014    Dr. Gala Romney via colostomy. Normal. Repeat in 2021.   Marland Kitchen Esophagogastroduodenoscopy N/A 12/11/2014    XMI:WOEHOZ EGD  . Cardiac  defibrillator placement  2009    Upgraded to a BiV ICD  . Right heart catheterization N/A 02/24/2015    Procedure: RIGHT HEART CATH;  Surgeon: Jolaine Artist, MD;  Location: [facility] CATH LAB;  Service: Cardiovascular;  Laterality: N/A;  . Ep implantable device N/A 04/10/2015    Procedure: Ppm/Biv Ppm Generator Changeout;  Surgeon: Evans Lance, MD;  Location: [place] INVASIVE CV LAB CUPID;  Service: Cardiovascular;  Laterality: N/A;  . Bi-ventricular implantable cardioverter defibrillator  (crt-d)  2009  . Cardiac catheterization  08/2001; 2009    ; /notes 07/10/2013  . Coronary angioplasty with stent placement  2001; ~ 2006    "1 + 1"   .  Givens capsule study N/A 07/23/2015    Procedure: GIVENS CAPSULE STUDY;  Surgeon: Wilford Corner, MD;  Location[facility] ENDOSCOPY;  Service: Endoscopy;  Laterality: N/A;  . Colonoscopy N/A 08/24/2015    Procedure: COLONOSCOPY;  Surgeon: Manus Gunning, MD;  Location: [place];  Service: Gastroenterology;  Laterality: N/A;  . Electrophysiologic study N/A 08/28/2015    Procedure: AV Node Ablation;  Surgeon: Will Meredith Leeds, MD;  Location: [place] CV LAB;  Service: Cardiovascular;  Laterality: N/A;  . Av fistula placement Right 09/16/2015    Procedure: ARTERIOVENOUS (AV) FISTULA CREATION - BRACHIOCEPHALIC;  Surgeon: Elam Dutch, MD;  Location: [facility] OR;  Service: Vascular;  Laterality: Right;    Family History  Problem Relation Age of Onset  . Colon cancer Mother 20  . Colon cancer Sister 15  . Coronary artery disease Father   . Colon cancer Other     2 cousins, succumbed to illness   Social History:  reports that he has never smoked. He has never used smokeless tobacco. He reports that he does not drink alcohol or use illicit drugs.  Allergies: No Known Allergies  OUTPATIENT MEDICATIONS: No current facility-administered medications on file prior to encounter.   Current Outpatient Prescriptions on File Prior to Encounter  Medication Sig Dispense  Refill  . apixaban (ELIQUIS) 5 MG TABS tablet Take 1 tablet (5 mg total) by mouth 2 (two) times daily. 60 tablet 3  . atorvastatin (LIPITOR) 80 MG tablet Take 1 tablet (80 mg total) by mouth daily. 30 tablet 6  . co-enzyme Q-10 50 MG capsule Take 50 mg by mouth every morning.     . ferrous sulfate 325 (65 FE) MG tablet Take 1 tablet (325 mg total) by mouth 2 (two) times daily with a meal. 60 tablet 0  . fluticasone (FLONASE) 50 MCG/ACT nasal spray Place 1 spray into both nostrils daily as needed for allergies.     . metoprolol succinate (TOPROL-XL) 25 MG 24 hr tablet Take 0.5 tablets (12.5 mg total) by mouth 2 (two) times daily. 45 tablet 3  . Omega-3 Fatty Acids (FISH OIL) 1000 MG CAPS Take 1,000 mg by mouth 2 (two) times daily.    . pantoprazole (PROTONIX) 40 MG tablet Take 1 tablet (40 mg total) by mouth daily. 30 tablet 0  . potassium chloride SA (K-DUR,KLOR-CON) 20 MEQ tablet Take 20 mEq by mouth daily.    Marland Kitchen PROAIR RESPICLICK 458 (90 BASE) MCG/ACT AEPB Inhale 2 puffs into the lungs every 6 (six) hours as needed (for breathing).   0  . torsemide (DEMADEX) 20 MG tablet Take 4 tablets by mouth every morning and 2 tables by mouth every evening 180 tablet 3  . traMADol (ULTRAM) 50 MG tablet Take 1 tablet (50 mg total) by mouth every 6 (six) hours as needed. 15 tablet 0   CURRENT MEDICATIONS: Scheduled Meds: . apixaban  5 mg Oral BID  . [START ON 05/11/2016] aspirin EC  81 mg Oral Daily  . ferrous sulfate  325 mg Oral BID WC  . insulin aspart  0-9 Units Subcutaneous TID WC  . insulin glargine  20 Units Subcutaneous Daily  . metoprolol succinate  12.5 mg Oral BID  . omega-3 acid ethyl esters  1 g Oral Daily  . pantoprazole  40 mg Oral Daily  . potassium chloride SA  20 mEq Oral BID  . torsemide  40 mg Oral q1800  . torsemide  80 mg Oral Q breakfast   Continuous Infusions:  PRN Meds:.albuterol, fluticasone, morphine injection, nitroGLYCERIN, ondansetron (ZOFRAN) IV, traMADol    Results  for orders placed or performed during the hospital encounter of 05/09/16 (from the past 48 hour(s))  CBC with Differential/Platelet     Status: Abnormal   Collection Time: 05/09/16 11:43 PM  Result Value Ref Range   WBC 8.1 4.0 - 10.5 K/uL   RBC 3.51 (L) 4.22 - 5.81 MIL/uL   Hemoglobin 11.1 (L) 13.0 - 17.0 g/dL   HCT 33.8 (L) 39.0 - 52.0 %   MCV 96.3 78.0 - 100.0 fL   MCH 31.6 26.0 - 34.0 pg   MCHC 32.8 30.0 - 36.0 g/dL   RDW 13.8 11.5 - 15.5 %   Platelets 145 (L) 150 - 400 K/uL   Neutrophils Relative % 77 %   Neutro Abs 6.2 1.7 - 7.7 K/uL   Lymphocytes Relative 14 %   Lymphs Abs 1.1 0.7 - 4.0 K/uL   Monocytes Relative 7 %   Monocytes Absolute 0.6 0.1 - 1.0 K/uL   Eosinophils Relative 2 %   Eosinophils Absolute 0.2 0.0 - 0.7 K/uL   Basophils Relative 0 %   Basophils Absolute 0.0 0.0 - 0.1 K/uL  Comprehensive metabolic panel     Status: Abnormal   Collection Time: 05/09/16 11:43 PM  Result Value Ref Range   Sodium 134 (L) 135 - 145 mmol/L   Potassium 3.4 (L) 3.5 - 5.1 mmol/L   Chloride 98 (L) 101 - 111 mmol/L   CO2 25 22 - 32 mmol/L   Glucose, Bld 305 (H) 65 - 99 mg/dL   BUN 41 (H) 6 - 20 mg/dL   Creatinine, Ser 2.79 (H) 0.61 - 1.24 mg/dL   Calcium 9.7 8.9 - 10.3 mg/dL   Total Protein 7.6 6.5 - 8.1 g/dL   Albumin 3.2 (L) 3.5 - 5.0 g/dL   AST 185 (H) 15 - 41 U/L   ALT 141 (H) 17 - 63 U/L   Alkaline Phosphatase 219 (H) 38 - 126 U/L   Total Bilirubin 2.2 (H) 0.3 - 1.2 mg/dL   GFR calc non Af Amer 23 (L) >60 mL/min   GFR calc Af Amer 27 (L) >60 mL/min    Comment: (NOTE) The eGFR has been calculated using the CKD EPI equation. This calculation has not been validated in all clinical situations. eGFR's persistently <60 mL/min signify possible Chronic Kidney Disease.    Anion gap 11 5 - 15  Lipase, blood     Status: None   Collection Time: 05/09/16 11:43 PM  Result Value Ref Range   Lipase 49 11 - 51 U/L  Troponin I     Status: Abnormal   Collection Time: 05/09/16 11:43  PM  Result Value Ref Range   Troponin I 0.04 (H) <0.031 ng/mL    Comment:        PERSISTENTLY INCREASED TROPONIN VALUES IN THE RANGE OF 0.04-0.49 ng/mL CAN BE SEEN IN:       -UNSTABLE ANGINA       -CONGESTIVE HEART FAILURE       -MYOCARDITIS       -CHEST TRAUMA       -ARRYHTHMIAS       -LATE PRESENTING MYOCARDIAL INFARCTION       -COPD   CLINICAL FOLLOW-UP RECOMMENDED.   Brain natriuretic peptide     Status: Abnormal   Collection Time: 05/09/16 11:43 PM  Result Value Ref Range   B Natriuretic Peptide 377.6 (H) 0.0 - 100.0 pg/mL  Ammonia  Status: None   Collection Time: 05/10/16  1:40 AM  Result Value Ref Range   Ammonia 21 9 - 35 umol/L  Protime-INR     Status: Abnormal   Collection Time: 05/10/16  1:40 AM  Result Value Ref Range   Prothrombin Time 18.3 (H) 11.6 - 15.2 seconds   INR 1.51 (H) 0.00 - 1.49  Magnesium     Status: Abnormal   Collection Time: 05/10/16  4:15 AM  Result Value Ref Range   Magnesium 2.5 (H) 1.7 - 2.4 mg/dL  Troponin I     Status: Abnormal   Collection Time: 05/10/16  4:15 AM  Result Value Ref Range   Troponin I 0.04 (H) <0.031 ng/mL    Comment:        PERSISTENTLY INCREASED TROPONIN VALUES IN THE RANGE OF 0.04-0.49 ng/mL CAN BE SEEN IN:       -UNSTABLE ANGINA       -CONGESTIVE HEART FAILURE       -MYOCARDITIS       -CHEST TRAUMA       -ARRYHTHMIAS       -LATE PRESENTING MYOCARDIAL INFARCTION       -COPD   CLINICAL FOLLOW-UP RECOMMENDED.   Lipid panel     Status: Abnormal   Collection Time: 05/10/16  4:15 AM  Result Value Ref Range   Cholesterol 163 0 - 200 mg/dL   Triglycerides 96 <150 mg/dL   HDL 27 (L) >40 mg/dL   Total CHOL/HDL Ratio 6.0 RATIO   VLDL 19 0 - 40 mg/dL   LDL Cholesterol 117 (H) 0 - 99 mg/dL    Comment:        Total Cholesterol/HDL:CHD Risk Coronary Heart Disease Risk Table                     Men   Women  1/2 Average Risk   3.4   3.3  Average Risk       5.0   4.4  2 X Average Risk   9.6   7.1  3 X Average  Risk  23.4   11.0        Use the calculated Patient Ratio above and the CHD Risk Table to determine the patient's CHD Risk.        ATP III CLASSIFICATION (LDL):  <100     mg/dL   Optimal  100-129  mg/dL   Near or Above                    Optimal  130-159  mg/dL   Borderline  160-189  mg/dL   High  >190     mg/dL   Very High   Basic metabolic panel     Status: Abnormal   Collection Time: 05/10/16  4:15 AM  Result Value Ref Range   Sodium 136 135 - 145 mmol/L   Potassium 3.4 (L) 3.5 - 5.1 mmol/L   Chloride 102 101 - 111 mmol/L   CO2 24 22 - 32 mmol/L   Glucose, Bld 245 (H) 65 - 99 mg/dL   BUN 42 (H) 6 - 20 mg/dL   Creatinine, Ser 2.65 (H) 0.61 - 1.24 mg/dL   Calcium 9.2 8.9 - 10.3 mg/dL   GFR calc non Af Amer 24 (L) >60 mL/min   GFR calc Af Amer 28 (L) >60 mL/min    Comment: (NOTE) The eGFR has been calculated using the CKD EPI equation. This calculation has not been validated  in all clinical situations. eGFR's persistently <60 mL/min signify possible Chronic Kidney Disease.    Anion gap 10 5 - 15  Glucose, capillary     Status: Abnormal   Collection Time: 05/10/16  7:16 AM  Result Value Ref Range   Glucose-Capillary 184 (H) 65 - 99 mg/dL   Dg Chest 2 View  05/10/2016  CLINICAL DATA:  60 year old male with right chest pain. EXAM: CHEST  2 VIEW COMPARISON:  Radiograph dated 09/30/2015 FINDINGS: Two views of the chest demonstrate clear lungs with sharp costophrenic angles. There is no pneumothorax. Stable mild cardiomegaly. Left pectoral AICD device. There is osteopenia with degenerative changes of the spine. No acute fracture identified. IMPRESSION: No active cardiopulmonary disease. Electronically Signed   By: Anner Crete M.D.   On: 05/10/2016 00:33   US Abdomen Complete  05/10/2016  CLINICAL DATA:  Upper abdominal pain.  Elevated LFTs. EXAM: ABDOMEN ULTRASOUND COMPLETE COMPARISON:  Abdominal CT 05/05/2016 FINDINGS: Gallbladder: Cholelithiasis. There is gallbladder wall  thickening which is attributed to underdistention. No focal tenderness or pericholecystic inflammation. Common bile duct: Diameter: 4 mm. Where visualized, no filling defect. Liver: Coarsened echogenic liver correlating with cirrhotic findings on previous CT. No focal finding. Antegrade flow in the imaged portal venous system. No ascites. IVC: No abnormality visualized. Pancreas: Obscured.  No significant finding on recent CT. Spleen: Size and appearance within normal limits. Right Kidney: Length: 10 cm. Echogenicity within normal limits. No mass or hydronephrosis visualized. Left Kidney: Length: 10 cm. Echogenicity within normal limits. No mass or hydronephrosis visualized. Abdominal aorta: No aneurysm visualized. IMPRESSION: 1. Cholelithiasis without acute cholecystitis. 2. Cirrhosis without ascites. 3. Obscured pancreas. Electronically Signed   By: Monte Fantasia M.D.   On: 05/10/2016 02:49    ROS: General:no colds or fevers, mild wt. increase on different scales Skin:no rashes or ulcers HEENT:no blurred vision, no congestion CV:see HPI PUL:see HPI GI:no diarrhea constipation or melena, no indigestion GU:no hematuria, no dysuria MS:no joint pain, no claudication Neuro:no syncope, no lightheadedness Endo:+ diabetes, no thyroid disease   Blood pressure 90/60, pulse 69, temperature 98 F (36.7 C), temperature source Oral, resp. rate 18, height '5\' 10"'$  (1.778 m), weight 191 lb 3.2 oz (86.728 kg), SpO2 100 %.  Wt Readings from Last 3 Encounters:  05/10/16 191 lb 3.2 oz (86.728 kg)  05/07/16 193 lb 12.8 oz (87.907 kg)  03/17/16 188 lb (85.276 kg)    PE: General:Pleasant affect, NAD Skin:Warm and dry, brisk capillary refill HEENT:normocephalic, sclera clear, mucus membranes moist Neck:supple, mild JVD, no bruits  Heart:S1S2 RRR without murmur, gallup, rub or click Lungs:clear without rales, rhonchi, or wheezes AYT:KZSW, non tender, + BS, do not palpate liver spleen or masses, + colostomy  stable Ext:no lower ext edema, 2+ pedal pulses, 2+ radial pulses Neuro:alert and oriented X 3, MAE, follows commands, + facial symmetry    Assessment/Plan Principal Problem:   Chest pain Active Problems:   History of rectal cancer   Ischemic cardiomyopathy-EF 35% 04/24/15 echo   Long term current use of anticoagulant therapy   CAD with LCX disease and stents to LAD   Chronic systolic heart failure (HCC)   Paroxysmal atrial fibrillation (HCC)   Chronic hypotension   Chronic kidney disease, stage IV (severe) (HCC)   DM type 2, uncontrolled, with renal complications (HCC)   GERD (gastroesophageal reflux disease)   OSA on CPAP   Hyperlipidemia   BiV ICD ([employer]) gen change 04/10/15   Chronic cholecystitis   NSTEMI- 04/23/15- Myoview  scar- no cath   Transaminitis   Atrial fibrillation, chronic ([place])  1. Chest pain with known CAD and troponins very mildly elevated and flat.  EKG without acute changes though pacing. Most likely related to his abd pain.   2. CAD with LAD stents last nuc 2016 without change.  3. ICM with no SOB and stable CXR. On torsemide 80 in AM and 40 in pm.   4. S/p CRT-D  5. A fib mostly chronic though in April was in SR per EP note. Pt is pacing  CHA2DS2VASc score     5 and is on Eliquis.  On toprol XL 12.5 BID  6.  Elevated LFTs with liver appearing as cirrhosis, GB on CT did not appear acute but ultrasound does show stone.  Dr. Allyson Sabal to call surgery for consult.   Pt stated Dr. Haroldine Laws stated he could not have any surgery- he may need percutaneous drain.   7. Hx of hypotension and BP here 90/60 to 103.53.  8. DM per IM.  Cecilie Kicks  Nurse Practitioner Certified [place] Pager [phone] or after 5pm or weekends call [phone] 05/10/2016, 9:13 AM     Personally seen and examined. Agree with above. 60 year old male with ischemic cardiomyopathy, chronic systolic heart failure ejection fraction in the 20% range followed by heart  failure clinic with CRT here with elevated liver enzymes concerning for cholelithiasis, symptomatic.  Preoperative risk stratification -He would be of moderate to high risk from a cardiac perspective for surgery given his underlying ischemic cardiomyopathy, chronic systolic heart failure however if risk to his life is high enough without surgical intervention, he may proceed. He does not have any evidence of acute volume overload, no adverse arrhythmias, no acute coronary syndrome. His low level troponin increase of 0.4 which is flat is demand ischemia in the setting of his underlying illness. We had lengthy discussion about this. He remembers Dr. Haroldine Laws telling him that he could not have surgery. I explained that this was likely elective procedures such as knee replacement or back surgery but in this situation if he does have a life-threatening or urgent need, surgery may need to take place with careful surveillance. -If it is decided that he does need a procedure, Eliquis will need to be held 2-3 days prior.  Abnormal LFTs/abdominal pain -Per primary team -Awaiting surgical consultation.  Lungs are clear, he appears comfortable.  We will follow along.  Candee Furbish, MD

## 2016-05-10 NOTE — Progress Notes (Signed)
Patient seen and examined  60 y.o. male with medical history significant of CAD, MI, s/p of stent, chronic mild elevation of troponin, PAF on Eliquis, hyperlipidemia, diabetes mellitus, GERD, ICD placement, TIA, SDH, sCHF with EF 35%, OSA on CPAP, CKD-IV, rectal cancer (s/p of colotomy), prostate cancer (s/p of seed with chemotherapy and XRT), chronic cholecystitis, chronic hypotension, who presents with chest pain.  Chest x-ray negative, liver functions mildly elevated, abnormal AST/ALT. CT abdomen pelvis 5/31 ruled out metastatic disease, three-vessel coronary artery disease, left ventricular apical aneurysm, ultrasound abdomen showed cholelithiasis without acute cholecystitis.  Assessment and plan Acute cholecystitis: has elevated LFT since 10/16. US showed cholelithiasis, suspected acute cholecystitis. No nausea, vomiting, abdominal pain Gen. surgery consulted as liver function trending up Moderate to high risk for intervention if needed per cardiology -Avoid using Tylenol/statin HIDA scan  Chest pain, CAD, chronic elevation of trop: s/p stent. Pt has chronic mild elevation of trop (0.04 to 0.05). Today his trop is 0.04 2. His chest pain has completely resolved now. Chest x-ray is negative for any pneumonia. Patient is on Eliquis, no missing dose, less likely to have pulmonary embolism.  Telemetry shows paced rhythm - cycle CE q6 x3 and repeat her EKG in the am  - prn Nitroglycerin, Morphine, metoprolol and lipitor  - Risk factor stratification: LDL 117 - 2d echo pending, cardiology consult - please call Card in AM-NSTEMI in 5/16.  - Cardiolite without high ischemic burden. Poor cath candidate with renal failure No ASA given Eliquis use.Continue Toprol XL   Chronic systolic HEART failure: 2-D echo on 04/24/15 showed EF 35%. Patient is taking torsemide currently. BNP slightly elevated at 377, but no leg edema or JVD. CHF is compensated. -Continue torsemide, metoprolol  Atrial  Fibrillation: . CHADS2Vasc Score 5. s/p AV nodal ablation needs oral anticoagulation. Patient is on Eliquis at home. INR is 1.51 on admission. Heart rate is well controlled. -continue metoprolol and Eliquis  Chronic hypotension: Blood pressure is soft, 93/53, which is at baseline. Mental status normal. No signs of infection. -Observe closely  Chronic kidney disease, stage IV (severe) ([place]): Stable. Baseline creatinine 2.5-3.0. His creatinine is 2.79, BUN 41 on admission.Followed by Dr Lowanda Foster in [place] -Follow-up renal function by BMP  DM-II: Last A1c 6.9, well controled. Patient is taking Lantus at home. Blood sugar is 305-->recieved 10 units of insulin in ED. -will decrease Lantus dose from 30--> 20 units daily -SSI -Check A1c  GERD: -Protonix  OSA: -CPAP  HLD: Last LDL was 41 on 03/18/16  -Continue home medications: Lipitor

## 2016-05-10 NOTE — ED Notes (Signed)
Patient transported to Ultrasound 

## 2016-05-11 ENCOUNTER — Other Ambulatory Visit: Payer: Self-pay | Admitting: *Deleted

## 2016-05-11 ENCOUNTER — Telehealth: Payer: Self-pay | Admitting: Oncology

## 2016-05-11 ENCOUNTER — Inpatient Hospital Stay (HOSPITAL_COMMUNITY): Payer: Medicare Other

## 2016-05-11 DIAGNOSIS — R945 Abnormal results of liver function studies: Secondary | ICD-10-CM | POA: Insufficient documentation

## 2016-05-11 DIAGNOSIS — K7689 Other specified diseases of liver: Secondary | ICD-10-CM

## 2016-05-11 DIAGNOSIS — Z85048 Personal history of other malignant neoplasm of rectum, rectosigmoid junction, and anus: Secondary | ICD-10-CM

## 2016-05-11 DIAGNOSIS — K7460 Unspecified cirrhosis of liver: Secondary | ICD-10-CM

## 2016-05-11 DIAGNOSIS — R748 Abnormal levels of other serum enzymes: Secondary | ICD-10-CM

## 2016-05-11 DIAGNOSIS — K810 Acute cholecystitis: Secondary | ICD-10-CM

## 2016-05-11 DIAGNOSIS — D61818 Other pancytopenia: Secondary | ICD-10-CM

## 2016-05-11 DIAGNOSIS — D696 Thrombocytopenia, unspecified: Secondary | ICD-10-CM

## 2016-05-11 DIAGNOSIS — R079 Chest pain, unspecified: Secondary | ICD-10-CM

## 2016-05-11 LAB — CBC
HCT: 34.9 % — ABNORMAL LOW (ref 39.0–52.0)
Hemoglobin: 11.2 g/dL — ABNORMAL LOW (ref 13.0–17.0)
MCH: 30.8 pg (ref 26.0–34.0)
MCHC: 32.1 g/dL (ref 30.0–36.0)
MCV: 95.9 fL (ref 78.0–100.0)
PLATELETS: 136 10*3/uL — AB (ref 150–400)
RBC: 3.64 MIL/uL — ABNORMAL LOW (ref 4.22–5.81)
RDW: 14.1 % (ref 11.5–15.5)
WBC: 4.5 10*3/uL (ref 4.0–10.5)

## 2016-05-11 LAB — GLUCOSE, CAPILLARY
GLUCOSE-CAPILLARY: 152 mg/dL — AB (ref 65–99)
GLUCOSE-CAPILLARY: 159 mg/dL — AB (ref 65–99)
GLUCOSE-CAPILLARY: 208 mg/dL — AB (ref 65–99)
Glucose-Capillary: 162 mg/dL — ABNORMAL HIGH (ref 65–99)
Glucose-Capillary: 190 mg/dL — ABNORMAL HIGH (ref 65–99)
Glucose-Capillary: 262 mg/dL — ABNORMAL HIGH (ref 65–99)

## 2016-05-11 LAB — COMPREHENSIVE METABOLIC PANEL
ALBUMIN: 3 g/dL — AB (ref 3.5–5.0)
ALK PHOS: 232 U/L — AB (ref 38–126)
ALT: 327 U/L — ABNORMAL HIGH (ref 17–63)
ANION GAP: 11 (ref 5–15)
AST: 317 U/L — ABNORMAL HIGH (ref 15–41)
BILIRUBIN TOTAL: 4.4 mg/dL — AB (ref 0.3–1.2)
BUN: 38 mg/dL — ABNORMAL HIGH (ref 6–20)
CALCIUM: 9.6 mg/dL (ref 8.9–10.3)
CO2: 25 mmol/L (ref 22–32)
Chloride: 102 mmol/L (ref 101–111)
Creatinine, Ser: 2.45 mg/dL — ABNORMAL HIGH (ref 0.61–1.24)
GFR calc Af Amer: 31 mL/min — ABNORMAL LOW (ref >60)
GFR, EST NON AFRICAN AMERICAN: 27 mL/min — AB (ref >60)
Glucose, Bld: 149 mg/dL — ABNORMAL HIGH (ref 65–99)
POTASSIUM: 3.7 mmol/L (ref 3.5–5.1)
Sodium: 138 mmol/L (ref 135–145)
TOTAL PROTEIN: 7.4 g/dL (ref 6.5–8.1)

## 2016-05-11 LAB — ECHOCARDIOGRAM COMPLETE
HEIGHTINCHES: 70 in
WEIGHTICAEL: 3026.47 [oz_av]

## 2016-05-11 LAB — HEMOGLOBIN A1C
HEMOGLOBIN A1C: 11.5 % — AB (ref 4.8–5.6)
MEAN PLASMA GLUCOSE: 283 mg/dL

## 2016-05-11 LAB — CK: CK TOTAL: 27 U/L — AB (ref 49–397)

## 2016-05-11 MED ORDER — PERFLUTREN LIPID MICROSPHERE
1.0000 mL | INTRAVENOUS | Status: AC | PRN
  Administered 2016-05-11: 2 mL via INTRAVENOUS
  Filled 2016-05-11: qty 10

## 2016-05-11 NOTE — Progress Notes (Addendum)
Patient Name: [name] Date of Encounter: 05/11/2016  Hospital Problem List     Principal Problem:   Chest pain Active Problems:   History of rectal cancer   Ischemic cardiomyopathy-EF 35% 04/24/15 echo   Long term current use of anticoagulant therapy   CAD with LCX disease and stents to LAD   Chronic systolic heart failure (HCC)   Paroxysmal atrial fibrillation (HCC)   Chronic hypotension   Chronic kidney disease, stage IV (severe) (HCC)   DM type 2, uncontrolled, with renal complications (HCC)   GERD (gastroesophageal reflux disease)   OSA on CPAP   Hyperlipidemia   BiV ICD (St Jude) gen change 04/10/15   Chronic cholecystitis   NSTEMI- 04/23/15- Myoview scar- no cath   Transaminitis   Atrial fibrillation, chronic (HCC)   Acute cholecystitis    Subjective   Feeling well this morning. No reports of chest pain, dyspnea or abd pain.  Inpatient Medications    . apixaban  5 mg Oral BID  . aspirin EC  81 mg Oral Daily  . ferrous sulfate  325 mg Oral BID WC  . insulin aspart  0-9 Units Subcutaneous TID WC  . insulin glargine  20 Units Subcutaneous Daily  . metoprolol succinate  12.5 mg Oral BID  . omega-3 acid ethyl esters  1 g Oral Daily  . pantoprazole  40 mg Oral Daily  . piperacillin-tazobactam (ZOSYN)  IV  3.375 g Intravenous Q8H  . potassium chloride SA  20 mEq Oral BID  . torsemide  40 mg Oral q1800  . torsemide  80 mg Oral Q breakfast    Vital Signs    Filed Vitals:   05/10/16 1500 05/10/16 2254 05/11/16 0103 05/11/16 0438  BP: 105/66 105/64 106/53 93/51  Pulse: 70  70 72  Temp: 98.1 F (36.7 C)  98 F (36.7 C) 97.7 F (36.5 C)  TempSrc: Oral  Oral Oral  Resp: 18  19 20   Height:      Weight:    189 lb 2.5 oz (85.8 kg)  SpO2:   96% 100%    Intake/Output Summary (Last 24 hours) at 05/11/16 0949 Last data filed at 05/11/16 0929  Gross per 24 hour  Intake    680 ml  Output   1495 ml  Net   -815 ml   Filed Weights   05/09/16 2336 05/10/16  0458 05/11/16 0438  Weight: 189 lb (85.73 kg) 191 lb 3.2 oz (86.728 kg) 189 lb 2.5 oz (85.8 kg)    Physical Exam    General: Pleasant, NAD. Neuro: Alert and oriented X 3. Moves all extremities spontaneously. Psych: Normal affect. HEENT:  Normal  Neck: Supple without bruits or JVD. Lungs:  Resp regular and unlabored, CTA. Heart: RRR no s3, s4, or murmurs. Abdomen: Soft, non-tender, non-distended, BS + x 4. Colostomy stable. Extremities: No clubbing, cyanosis or edema. DP/PT/Radials 2+ and equal bilaterally.  Labs    CBC  Recent Labs  05/09/16 2343 05/11/16 0602  WBC 8.1 4.5  NEUTROABS 6.2  --   HGB 11.1* 11.2*  HCT 33.8* 34.9*  MCV 96.3 95.9  PLT 145* [id number]*   Basic Metabolic Panel  Recent Labs  05/10/16 0415 05/11/16 0602  NA 136 138  K 3.4* 3.7  CL 102 102  CO2 24 25  GLUCOSE 245* 149*  BUN 42* 38*  CREATININE 2.65* 2.45*  CALCIUM 9.2 9.6  MG 2.5*  --    Liver Function Tests  Recent Labs  05/10/16 0829 05/11/16 0602  AST 579* 317*  ALT 352* 327*  ALKPHOS 218* 232*  BILITOT 3.2* 4.4*  PROT 7.6 7.4  ALBUMIN 3.1* 3.0*    Recent Labs  05/09/16 2343  LIPASE 49   Cardiac Enzymes  Recent Labs  05/10/16 0415 05/10/16 0829 05/10/16 1432 05/11/16 0602  CKTOTAL  --  38*  --  27*  TROPONINI 0.04* 0.04* 0.03  --    Hemoglobin A1C  Recent Labs  05/10/16 0415  HGBA1C 11.5*   Fasting Lipid Panel  Recent Labs  05/10/16 0415  CHOL 163  HDL 27*  LDLCALC 117*  TRIG 96  CHOLHDL 6.0    Telemetry    AV paced rhythm 70-80s  ECG    No morning EKG  Radiology    Dg Chest 2 View  05/10/2016  CLINICAL DATA:  60 year old male with right chest pain. EXAM: CHEST  2 VIEW COMPARISON:  Radiograph dated 09/30/2015 FINDINGS: Two views of the chest demonstrate clear lungs with sharp costophrenic angles. There is no pneumothorax. Stable mild cardiomegaly. Left pectoral AICD device. There is osteopenia with degenerative changes of the spine. No acute  fracture identified. IMPRESSION: No active cardiopulmonary disease. Electronically Signed   By: Anner Crete M.D.   On: 05/10/2016 00:33   US Abdomen Complete  05/10/2016  CLINICAL DATA:  Upper abdominal pain.  Elevated LFTs. EXAM: ABDOMEN ULTRASOUND COMPLETE COMPARISON:  Abdominal CT 05/05/2016 FINDINGS: Gallbladder: Cholelithiasis. There is gallbladder wall thickening which is attributed to underdistention. No focal tenderness or pericholecystic inflammation. Common bile duct: Diameter: 4 mm. Where visualized, no filling defect. Liver: Coarsened echogenic liver correlating with cirrhotic findings on previous CT. No focal finding. Antegrade flow in the imaged portal venous system. No ascites. IVC: No abnormality visualized. Pancreas: Obscured.  No significant finding on recent CT. Spleen: Size and appearance within normal limits. Right Kidney: Length: 10 cm. Echogenicity within normal limits. No mass or hydronephrosis visualized. Left Kidney: Length: 10 cm. Echogenicity within normal limits. No mass or hydronephrosis visualized. Abdominal aorta: No aneurysm visualized. IMPRESSION: 1. Cholelithiasis without acute cholecystitis. 2. Cirrhosis without ascites. 3. Obscured pancreas. Electronically Signed   By: Monte Fantasia M.D.   On: 05/10/2016 02:49    Assessment & Plan    1. Chest pain: with known CAD and troponins very mildly elevated and flat. EKG without acute changes though pacing. Most likely related to his abd pain on admission.  --Denies any chest pain overnight into the morning. Has been ambulatory in the hallway without chest pain.   2. CAD with LAD stents: last nuc 2016 without change.  3. ICM: with no SOB and stable CXR. On torsemide 80 in AM and 40 in pm.   4. S/p CRT-D:  5. A fib: mostly chronic though in April was in SR per EP note. Pt is pacing CHA2DS2VASc score5 and is on Eliquis. On toprol XL 12.5 BID  6. Elevated LFTs: with liver appearing as cirrhosis, GB on CT did not  appear acute but ultrasound does show stone.  --Surgery was consulted yesterday and no plans for surgical intervention at this time, following --Would be moderate to high risk in the event that intervention is needed given his underlying ischemic cardiomyopathy, chronic systolic heart failure  7. Hx of hypotension: BP here 90/60 to 103/53.  8. DM: per IM.  Signed, Reino Bellis NP-C Pager [phone]   Personally seen and examined. Agree with above. Surgical note reviewed. No need for surgery at this point. I do  not think that pain was from cardiac source. Please see prior consult for full details. If surgery or percutaneous drain is eventually required, he may proceed with moderate to high overall cardiac risk.  EF remains quite low, 10-15% on echocardiogram. He is not demonstrating any signs of acute heart failure.  We will sign off. Dr. Gearldine Shown note says elevated liver enzyme and cirrhosis workup per cardiology. I will defer this workup to the internal medicine team. Please let us know if you need any further assistance.  Candee Furbish, MD

## 2016-05-11 NOTE — Progress Notes (Signed)
Pt refuses CPAP. Pt encouraged to call RT if changing mind. No distress noted

## 2016-05-11 NOTE — Progress Notes (Signed)
[facility] PROGRESS NOTE  [name] [id number] DOB: Sep 10, 1956 DOA: 05/09/2016   PCP: Glo Herring., MD     Assessment/Plan: Principal Problem:   Chest pain Active Problems:   History of rectal cancer   Ischemic cardiomyopathy-EF 35% 04/24/15 echo   Long term current use of anticoagulant therapy   CAD with LCX disease and stents to LAD   Chronic systolic heart failure (HCC)   Paroxysmal atrial fibrillation (HCC)   Chronic hypotension   Chronic kidney disease, stage IV (severe) (HCC)   DM type 2, uncontrolled, with renal complications (HCC)   GERD (gastroesophageal reflux disease)   OSA on CPAP   Hyperlipidemia   BiV ICD (St Jude) gen change 04/10/15   Chronic cholecystitis   NSTEMI- 04/23/15- Myoview scar- no cath   Transaminitis   Atrial fibrillation, chronic (HCC)   Acute cholecystitis   60 y.o. male with medical history significant of CAD, MI, s/p of stent, chronic mild elevation of troponin, PAF on Eliquis, hyperlipidemia, diabetes mellitus, GERD, ICD placement, TIA, SDH, sCHF with EF 35%, OSA on CPAP, CKD-IV, rectal cancer (s/p of colotomy), prostate cancer (s/p of seed with chemotherapy and XRT), chronic cholecystitis, chronic hypotension, who presents with chest pain.  Chest x-ray negative, liver functions mildly elevated, abnormal AST/ALT. CT abdomen pelvis 5/31 ruled out metastatic disease, three-vessel coronary artery disease, left ventricular apical aneurysm, ultrasound abdomen showed cholelithiasis without acute cholecystitis.  Assessment and plan Probable Acute cholecystitis: has had intermittent elevated LFT since 4/16. US showed cholelithiasis, probable acute cholecystitis although clinically no pain. No nausea, vomiting, abdominal pain Gen. surgery consulted 6/5, it gets worse patient may need a percutaneous drain Moderate to high risk for intervention if needed per cardiology -Avoid using Tylenol/statin HIDA scan PICC line by the  patient Slight improvement in AST ALT, increasing bilirubin and alkaline phosphatase   Chest pain, CAD, chronic elevation of trop: s/p stent. Pt has chronic mild elevation of trop (0.04 to 0.05). Today his trop is 0.04 2. His chest pain has completely resolved now. Chest x-ray is negative for any pneumonia. Patient is on Eliquis, no missing dose, less likely to have pulmonary embolism.  Telemetry shows paced rhythm - cycle CE q6 x3 and repeat her EKG in the am  - prn Nitroglycerin, Morphine, metoprolol and lipitor  - Risk factor stratification: LDL 117 - 2d echo pending, cardiology consult - please call Card in AM-NSTEMI in 5/16.  - Cardiolite without high ischemic burden. Poor cath candidate with renal failure No ASA given Eliquis use.Continue Toprol XL   Chronic systolic HEART failure: 2-D echo on 04/24/15 showed EF 35%. Patient is taking torsemide currently. BNP slightly elevated at 377, but no leg edema or JVD. CHF is compensated. -Continue torsemide, metoprolol  Atrial Fibrillation: . CHADS2Vasc Score 5. s/p AV nodal ablation needs oral anticoagulation. Patient is on Eliquis at home. INR is 1.51 on admission. Heart rate is well controlled. -continue metoprolol and Eliquis  Chronic hypotension: Blood pressure is soft, 93/51, which is at baseline. Mental status normal. No signs of infection. -Observe closely  Chronic kidney disease, stage IV (severe) ([place]): Stable. Baseline creatinine 2.5-3.0. .Followed by Dr Lowanda Foster in [place] -Follow-up renal function by BMP  DM-II: Last A1c 6.9, well controled. Currently on Lantus and sliding scale insulin Continue current regimen Hemoglobin A1c 11.5,  GERD: -Protonix  OSA: -CPAP  HLD: Last LDL was 41 on 03/18/16  -Continue home medications: Lipitor  Stage III rectal cancer, diagnosed in  June of 2008-stable   DVT prophylaxsis eliquis   Code Status:  Full code   Family Communication: Discussed in detail with the patient,  all imaging results, lab results explained to the patient   Disposition Plan:  As per surgery , anticipate discharge home tomorrow if liver function stable     Consultants:  Hematology oncology  General surgery  Cardiology  Procedures:  None  Antibiotics: Anti-infectives    Start     Dose/Rate Route Frequency Ordered Stop   05/10/16 1200  piperacillin-tazobactam (ZOSYN) IVPB 3.375 g     3.375 g 12.5 mL/hr over 240 Minutes Intravenous Every 8 hours 05/10/16 1133           HPI/Subjective: Patient denies any right upper quadrant pain, denies any nausea vomiting   Objective: Filed Vitals:   05/10/16 1500 05/10/16 2254 05/11/16 0103 05/11/16 0438  BP: 105/66 105/64 106/53 93/51  Pulse: 70  70 72  Temp: 98.1 F (36.7 C)  98 F (36.7 C) 97.7 F (36.5 C)  TempSrc: Oral  Oral Oral  Resp: 18  19 20   Height:      Weight:    85.8 kg (189 lb 2.5 oz)  SpO2:   96% 100%    Intake/Output Summary (Last 24 hours) at 05/11/16 0947 Last data filed at 05/11/16 0929  Gross per 24 hour  Intake    680 ml  Output   1495 ml  Net   -815 ml    Exam:  Examination:  General exam: Appears calm and comfortable  Respiratory system: Clear to auscultation. Respiratory effort normal. Cardiovascular system: S1 & S2 heard, RRR. No JVD, murmurs, rubs, gallops or clicks. No pedal edema. Gastrointestinal system: Abdomen is nondistended, soft and nontender. No organomegaly or masses felt. Normal bowel sounds heard. Central nervous system: Alert and oriented. No focal neurological deficits. Extremities: Symmetric 5 x 5 power. Skin: No rashes, lesions or ulcers Psychiatry: Judgement and insight appear normal. Mood & affect appropriate.     Data Reviewed: I have personally reviewed following labs and imaging studies  Micro Results No results found for this or any previous visit (from the past 240 hour(s)).  Radiology Reports Ct Abdomen Pelvis Wo Contrast  05/05/2016  CLINICAL DATA:   60 year old male with history of rectal cancer diagnosed in 2008 status post surgical resection, chemotherapy and radiation therapy now complete. Follow-up for right perirectal soft tissue density. EXAM: CT ABDOMEN AND PELVIS WITHOUT CONTRAST TECHNIQUE: Multidetector CT imaging of the abdomen and pelvis was performed following the standard protocol without IV contrast. COMPARISON:  Multiple priors, most recently CT of the abdomen and pelvis 07/31/2015. FINDINGS: Lower chest: Cardiomegaly. Atherosclerotic calcifications in the left anterior descending, left circumflex and right coronary arteries. 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 Pacemaker leads in the right atrium, terminating in the right ventricular apex, and extending into the coronary veins via the coronary sinus. Hepatobiliary: Liver has a shrunken appearance and slightly nodular contour, suggestive of underlying cirrhosis. No definite cystic or solid hepatic lesion is confidently identified on today's noncontrast CT examination. Unenhanced appearance of the gallbladder is normal. Pancreas: No pancreatic mass or peripancreatic inflammatory changes are identified on today's noncontrast CT examination. Spleen: Unremarkable. Adrenals/Urinary Tract: Several tiny calcifications are noted within the collecting systems of the kidneys bilaterally, measuring up to 4 mm in the interpolar collecting system of the left kidney, likely to represent nonobstructive calculi) alternatively, some  may represent vascular calcifications). No calcifications are identified along the course of either ureter or within the lumen of the urinary bladder. No hydroureteronephrosis to indicate urinary tract obstruction at this time. Bilateral perinephric stranding is noted (nonspecific). Unenhanced appearance of the kidneys is otherwise unremarkable. Unenhanced  appearance of the adrenal glands is normal. Unenhanced appearance of the urinary bladder is unremarkable. Stomach/Bowel: Unenhanced appearance of the stomach is normal. There is no pathologic dilatation of small bowel or colon. Normal appendix. Status post resection of the rectum with left lower quadrant colostomy. There continues to be extensive thickening of the presacral soft tissues which is somewhat mass-like in appearance, but appears essentially unchanged compared to prior study from 07/31/2015, presumably an area of benign postoperative scarring. This measures up to 3.0 x 5.3 cm (axial image 73 of series 2). Likewise, there is a small amount of architectural distortion in the right side of the ischiorectal fossa where there is again some nodular soft tissue prominence in an area that is typically occupied by a nothing but ischiorectal fat. This is best demonstrated on image 78 of series 2 where this area of soft tissue prominence measures approximately 1.9 x 2.5 cm, slightly less apparent than the prior study from 07/31/2015, presumably an area of postoperative scarring. Vascular/Lymphatic: Atherosclerosis throughout the abdominal and pelvic vasculature, without evidence of aneurysm. No lymphadenopathy noted in the abdomen or pelvis on today's noncontrast CT examination. Reproductive: Fiducial markers in or immediately posterior to the prostate gland. Prostate gland is otherwise unremarkable in appearance. Seminal vesicles are not well visualized, likely distorted by postoperative and post treatment related scarring. Other: No significant volume of ascites.  No pneumoperitoneum. Musculoskeletal: There are no aggressive appearing lytic or blastic lesions noted in the visualized portions of the skeleton. IMPRESSION: 1. Stable post treatment related changes from prior surgery and radiation therapy in the low anatomic pelvis, as discussed above. No definite findings to suggest local recurrence of disease or  metastatic disease in the abdomen or pelvis on today's noncontrast CT examination. 2. Morphologic changes in the liver suggestive of underlying cirrhosis. 3. Multiple tiny nonobstructive calculi in the collecting systems of the kidneys bilaterally. 4. Extensive atherosclerosis, including at least 3 vessel coronary artery disease. 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 5. Additional incidental findings, as above. Electronically Signed   By: Vinnie Langton M.D.   On: 05/05/2016 09:00   Dg Chest 2 View  05/10/2016  CLINICAL DATA:  60 year old male with right chest pain. EXAM: CHEST  2 VIEW COMPARISON:  Radiograph dated 09/30/2015 FINDINGS: Two views of the chest demonstrate clear lungs with sharp costophrenic angles. There is no pneumothorax. Stable mild cardiomegaly. Left pectoral AICD device. There is osteopenia with degenerative changes of the spine. No acute fracture identified. IMPRESSION: No active cardiopulmonary disease. Electronically Signed   By: Anner Crete M.D.   On: 05/10/2016 00:33   US Abdomen Complete  05/10/2016  CLINICAL DATA:  Upper abdominal pain.  Elevated LFTs. EXAM: ABDOMEN ULTRASOUND COMPLETE COMPARISON:  Abdominal CT 05/05/2016 FINDINGS: Gallbladder: Cholelithiasis. There is gallbladder wall thickening which is attributed to underdistention. No focal tenderness or pericholecystic inflammation. Common bile duct: Diameter: 4 mm. Where visualized, no filling defect. Liver: Coarsened echogenic liver correlating with cirrhotic findings on previous CT. No focal finding. Antegrade flow in the imaged portal venous system. No ascites. IVC: No abnormality visualized. Pancreas: Obscured.  No significant finding on recent CT. Spleen: Size  and appearance within normal limits. Right Kidney: Length: 10 cm. Echogenicity within normal limits. No mass or hydronephrosis visualized. Left Kidney:  Length: 10 cm. Echogenicity within normal limits. No mass or hydronephrosis visualized. Abdominal aorta: No aneurysm visualized. IMPRESSION: 1. Cholelithiasis without acute cholecystitis. 2. Cirrhosis without ascites. 3. Obscured pancreas. Electronically Signed   By: Monte Fantasia M.D.   On: 05/10/2016 02:49     CBC  Recent Labs Lab 05/09/16 2343 05/11/16 0602  WBC 8.1 4.5  HGB 11.1* 11.2*  HCT 33.8* 34.9*  PLT 145* 136*  MCV 96.3 95.9  MCH 31.6 30.8  MCHC 32.8 32.1  RDW 13.8 14.1  LYMPHSABS 1.1  --   MONOABS 0.6  --   EOSABS 0.2  --   BASOSABS 0.0  --     Chemistries   Recent Labs Lab 05/07/16 1045 05/09/16 2343 05/10/16 0415 05/10/16 0829 05/11/16 0602  NA 136 134* 136  --  138  K 3.7 3.4* 3.4*  --  3.7  CL 99* 98* 102  --  102  CO2 28 25 24   --  25  GLUCOSE 280* 305* 245*  --  149*  BUN 40* 41* 42*  --  38*  CREATININE 2.59* 2.79* 2.65*  --  2.45*  CALCIUM 9.3 9.7 9.2  --  9.6  MG  --   --  2.5*  --   --   AST  --  185*  --  579* 317*  ALT  --  141*  --  352* 327*  ALKPHOS  --  219*  --  218* 232*  BILITOT  --  2.2*  --  3.2* 4.4*   ------------------------------------------------------------------------------------------------------------------ estimated creatinine clearance is 32.7 mL/min (by C-G formula based on Cr of 2.45). ------------------------------------------------------------------------------------------------------------------  Recent Labs  05/10/16 0415  HGBA1C 11.5*   ------------------------------------------------------------------------------------------------------------------  Recent Labs  05/10/16 0415  CHOL 163  HDL 27*  LDLCALC 117*  TRIG 96  CHOLHDL 6.0   ------------------------------------------------------------------------------------------------------------------ No results for input(s): TSH, T4TOTAL, T3FREE, THYROIDAB in the last 72 hours.  Invalid input(s):  FREET3 ------------------------------------------------------------------------------------------------------------------ No results for input(s): VITAMINB12, FOLATE, FERRITIN, TIBC, IRON, RETICCTPCT in the last 72 hours.  Coagulation profile  Recent Labs Lab 05/10/16 0140  INR 1.51*    No results for input(s): DDIMER in the last 72 hours.  Cardiac Enzymes  Recent Labs Lab 05/10/16 0415 05/10/16 0829 05/10/16 1432  TROPONINI 0.04* 0.04* 0.03   ------------------------------------------------------------------------------------------------------------------ Invalid input(s): POCBNP   CBG:  Recent Labs Lab 05/10/16 1111 05/10/16 1650 05/11/16 0415 05/11/16 0609 05/11/16 0814  GLUCAP 174* 145* 162* 152* 159*       Studies: Dg Chest 2 View  05/10/2016  CLINICAL DATA:  60 year old male with right chest pain. EXAM: CHEST  2 VIEW COMPARISON:  Radiograph dated 09/30/2015 FINDINGS: Two views of the chest demonstrate clear lungs with sharp costophrenic angles. There is no pneumothorax. Stable mild cardiomegaly. Left pectoral AICD device. There is osteopenia with degenerative changes of the spine. No acute fracture identified. IMPRESSION: No active cardiopulmonary disease. Electronically Signed   By: Anner Crete M.D.   On: 05/10/2016 00:33   US Abdomen Complete  05/10/2016  CLINICAL DATA:  Upper abdominal pain.  Elevated LFTs. EXAM: ABDOMEN ULTRASOUND COMPLETE COMPARISON:  Abdominal CT 05/05/2016 FINDINGS: Gallbladder: Cholelithiasis. There is gallbladder wall thickening which is attributed to underdistention. No focal tenderness or pericholecystic inflammation. Common bile duct: Diameter: 4 mm. Where visualized, no filling defect. Liver: Coarsened echogenic liver correlating with cirrhotic findings on  previous CT. No focal finding. Antegrade flow in the imaged portal venous system. No ascites. IVC: No abnormality visualized. Pancreas: Obscured.  No significant finding on  recent CT. Spleen: Size and appearance within normal limits. Right Kidney: Length: 10 cm. Echogenicity within normal limits. No mass or hydronephrosis visualized. Left Kidney: Length: 10 cm. Echogenicity within normal limits. No mass or hydronephrosis visualized. Abdominal aorta: No aneurysm visualized. IMPRESSION: 1. Cholelithiasis without acute cholecystitis. 2. Cirrhosis without ascites. 3. Obscured pancreas. Electronically Signed   By: Monte Fantasia M.D.   On: 05/10/2016 02:49      Lab Results  Component Value Date   HGBA1C 11.5* 05/10/2016   HGBA1C 6.9* 08/03/2015   HGBA1C 8.9* 04/05/2015   Lab Results  Component Value Date   LDLCALC 117* 05/10/2016   CREATININE 2.45* 05/11/2016       Scheduled Meds: . apixaban  5 mg Oral BID  . aspirin EC  81 mg Oral Daily  . ferrous sulfate  325 mg Oral BID WC  . insulin aspart  0-9 Units Subcutaneous TID WC  . insulin glargine  20 Units Subcutaneous Daily  . metoprolol succinate  12.5 mg Oral BID  . omega-3 acid ethyl esters  1 g Oral Daily  . pantoprazole  40 mg Oral Daily  . piperacillin-tazobactam (ZOSYN)  IV  3.375 g Intravenous Q8H  . potassium chloride SA  20 mEq Oral BID  . torsemide  40 mg Oral q1800  . torsemide  80 mg Oral Q breakfast   Continuous Infusions:    LOS: 1 day    Time spent: >30 MINS    [facility]  [facility] Pager [phone]. If 7PM-7AM, please contact night-coverage at [email or web address], password [facility] 05/11/2016, 9:47 AM  LOS: 1 day

## 2016-05-11 NOTE — Telephone Encounter (Signed)
Apt added per pof, pt will look at [facility]

## 2016-05-11 NOTE — Progress Notes (Signed)
  Echocardiogram 2D Echocardiogram with Definity has been performed.  Jennette Dubin 05/11/2016, 9:02 AM

## 2016-05-11 NOTE — Care Management Important Message (Signed)
Important Message  Patient Details  Name: [name] MRN: [id number] Date of Birth: 03-Jun-1956   Medicare Important Message Given:  Yes    [name] 05/11/2016, 8:24 AM

## 2016-05-11 NOTE — Progress Notes (Signed)
Inpatient Diabetes Program Recommendations  AACE/ADA: New Consensus Statement on Inpatient Glycemic Control (2015)  Target Ranges:  Prepandial:   less than 140 mg/dL      Peak postprandial:   less than 180 mg/dL (1-2 hours)      Critically ill patients:  140 - 180 mg/dL  Results for [name], [name] (MRN [id number]) as of 05/11/2016 15:42  Ref. Range 05/10/2016 07:16 05/10/2016 11:11 05/10/2016 16:50 05/11/2016 04:15 05/11/2016 06:09 05/11/2016 08:14 05/11/2016 11:34  Glucose-Capillary Latest Ref Range: 65-99 mg/dL 184 (H) 174 (H) 145 (H) 162 (H) 152 (H) 159 (H) 208 (H)  Results for [name], [name] (MRN [id number]) as of 05/11/2016 15:42  Ref. Range 05/10/2016 04:15  Hemoglobin A1C Latest Ref Range: 4.8-5.6 % 11.5 (H)    Review of Glycemic Control  Diabetes history: DM2 Outpatient Diabetes medications: Latnus 30 units QAM Current orders for Inpatient glycemic control: Lantus 20 units daily, Novolog 0-9 units TID with meals  Inpatient Diabetes Program Recommendations: Insulin - Meal Coverage: While inpatient , please consider ordering Novolog 3 units TID with meals for meal coverage. Outpatient Referral: A1C 11.5% on 05/10/16. Recommend patient follow up with an Endocrinologist regarding improving glycemic control. Patient will talk with PCP regarding referral to an Endocrinologist. Insulin-Correction: Please consider ordering Novolog bedtime correction scale.  NOTE: Spoke with patient about diabetes and home regimen for diabetes control. Patient reports that he is followed by Dr. Gerarda Fraction (PCP) for diabetes management and currently he takes Lantus 30 units QAM as an outpatient for diabetes control.  Patient reports that no changes were made with his insulin at his last office visit. Patient states that he checks his glucose 2-3 times per day and that it is usually ranges from 100's -300's mg/dl. Patient states that glucose was well controlled when he was taking Toujeo insulin. However his insurance would not authorize  for him to use Toujeo so he was put back on Lantus by his PCP. Patient reports that in the past he has seen Dr. Dorris Fetch (Endocrinologist in [place]) but he did not "particularly care for him or understand him". Therefore he did not go back to Dr. Dorris Fetch for follow up. Patient also reports that he use to see Dr. Dwyane Dee (Endocrinologist in [place]) and he last saw him about 8 years ago. Patient reports that he was taking Lantus daily and Novolog meal coverage TID when he was seeing Dr. Dwyane Dee and he used the Novolog until he ran out of insulin and never got it refilled.   Patient states that he would be interested in re-establishing care with Dr. Dwyane Dee for diabetes management. Encouraged patient to talk with his PCP about referring him to Dr. Dwyane Dee.  Discussed A1C results (11.5% on 05/10/16) and discussed glucose and A1C goals. Discussed importance of checking CBGs and maintaining good CBG control to prevent long-term and short-term complications. Explained how hyperglycemia leads to damage within blood vessels which lead to the common complications seen with uncontrolled diabetes. Stressed to the patient the importance of improving glycemic control to prevent further complications from uncontrolled diabetes. Discussed impact of nutrition, exercise, stress, sickness, and medications on diabetes control. Discussed carbohydrates, carbohydrate goals per day and meal, along with portion sizes. Encouraged patient to check his glucose 4 times per day (before meals and at bedtime) and to keep a log book of glucose readings and insulin taken which he will need to take to doctor appointments. Patient reports that he is willing to use Novolog insulin for meal coverage or correction  as an outpatient if prescribed.  Explained how the doctor can use the log book to continue to make insulin adjustments if needed. Patient verbalized understanding of information discussed and he states that he has no further questions at this time  related to diabetes.  Thanks, Barnie Alderman, RN, MSN, CDE Diabetes Coordinator Inpatient Diabetes Program [phone] (Team Pager) [phone] ([facility] office) [phone] [facility] office) [phone] [facility] office)

## 2016-05-11 NOTE — Progress Notes (Signed)
IP PROGRESS NOTE  Subjective:   [name] is well-known to me with a history of rectal cancer. He was scheduled to be seen at the [facility] yesterday. He is now admitted with acute onset upper abdominal pain gallbladder disease. He reports feeling well prior to the onset of pain on 05/09/2016. Good appetite. The colostomy is functioning.  A restaging CT of the abdomen and pelvis on 05/05/2016 revealed no change in presacral thickening compared to a CT from 07/31/2015. The distortion in the right ischiorectal fossa is slightly less apparent. No evidence of metastatic disease.  Objective: Vital signs in last 24 hours: Blood pressure 93/51, pulse 72, temperature 97.7 F (36.5 C), temperature source Oral, resp. rate 20, height 5\' 10"  (1.778 m), weight 189 lb 2.5 oz (85.8 kg), SpO2 100 %.  Intake/Output from previous day: 06/05 0701 - 06/06 0700 In: 220 [P.O.:220] Out: 1095 [Urine:1095]  Physical Exam:  HEENT: Neck without mass Lungs: Clear bilaterally Cardiac: Irregular Abdomen: No hepatomegaly, nontender, no mass Extremities: No leg edema Skin: Perineal scar without evidence of recurrent tumor    Lab Results:  Recent Labs  05/09/16 2343 05/11/16 0602  WBC 8.1 4.5  HGB 11.1* 11.2*  HCT 33.8* 34.9*  PLT 145* 136*    BMET  Recent Labs  05/10/16 0415 05/11/16 0602  NA 136 138  K 3.4* 3.7  CL 102 102  CO2 24 25  GLUCOSE 245* 149*  BUN 42* 38*  CREATININE 2.65* 2.45*  CALCIUM 9.2 9.6    Studies/Results: Dg Chest 2 View  05/10/2016  CLINICAL DATA:  60 year old male with right chest pain. EXAM: CHEST  2 VIEW COMPARISON:  Radiograph dated 09/30/2015 FINDINGS: Two views of the chest demonstrate clear lungs with sharp costophrenic angles. There is no pneumothorax. Stable mild cardiomegaly. Left pectoral AICD device. There is osteopenia with degenerative changes of the spine. No acute fracture identified. IMPRESSION: No active cardiopulmonary disease. Electronically  Signed   By: Anner Crete M.D.   On: 05/10/2016 00:33   US Abdomen Complete  05/10/2016  CLINICAL DATA:  Upper abdominal pain.  Elevated LFTs. EXAM: ABDOMEN ULTRASOUND COMPLETE COMPARISON:  Abdominal CT 05/05/2016 FINDINGS: Gallbladder: Cholelithiasis. There is gallbladder wall thickening which is attributed to underdistention. No focal tenderness or pericholecystic inflammation. Common bile duct: Diameter: 4 mm. Where visualized, no filling defect. Liver: Coarsened echogenic liver correlating with cirrhotic findings on previous CT. No focal finding. Antegrade flow in the imaged portal venous system. No ascites. IVC: No abnormality visualized. Pancreas: Obscured.  No significant finding on recent CT. Spleen: Size and appearance within normal limits. Right Kidney: Length: 10 cm. Echogenicity within normal limits. No mass or hydronephrosis visualized. Left Kidney: Length: 10 cm. Echogenicity within normal limits. No mass or hydronephrosis visualized. Abdominal aorta: No aneurysm visualized. IMPRESSION: 1. Cholelithiasis without acute cholecystitis. 2. Cirrhosis without ascites. 3. Obscured pancreas. Electronically Signed   By: Monte Fantasia M.D.   On: 05/10/2016 02:49    Medications: I have reviewed the patient's current medications.  Assessment/Plan: 1. Stage III rectal cancer, diagnosed in June of 2008: Status post neoadjuvant infusional 5-FU and concurrent radiation. He underwent an APR 10/04/2007 with the pathology confirming stage III disease. He completed 8 cycles of adjuvant FOLFOX therapy on 03/12/2008. A restaging CT 05/26/2010 showed no evidence of metastatic disease. He underwent a colonoscopy in October of 2012 with removal of a single pedunculated polyp-benign pathology 2. Prostate cancer: Status post radiation and 2 years of Lupron therapy per Dr. Terance Hart. 3.  History of thrombocytopenia secondary to chemotherapy. The platelet count was normal on 05/02/12. 4. History of delayed nausea  secondary to chemotherapy: Improved with Aloxi. 5. History of oxaliplatin neuropathy. 6. Ischemic cardiomyopathy followed by Dr. Rollene Fare. 7. History of bilateral axillary fullness. 8. Diabetes. 9. Status post Port-A-Cath removal. 10. Hospitalization with pneumonia October 2012. 11. Question small bilateral inguinal lymph nodes on exam 04/17/2013-? Small left inguinal node on exam 07/17/2013. 12. CT 04/05/2015 with a 2.7 cm perirectal nodule; CT 07/31/2015 with mild increase in size of asymmetric right perirectal soft tissue density.  CT 05/05/2016 with no change in the perirectal soft tissue density, no evidence of metastatic disease 13. Placement of right arm fistula 09/16/2015. 14. Admission 05/10/2016 with subxiphoid discomfort-potentially related to gallbladder disease 15. Possible cirrhosis noted on the abdomen CT 05/10/2016 16. Mild thrombocytopenia-potentially related to cirrhosis  [name] remains in clinical remission from rectal cancer. The restaging CT on 05/05/2016 reveals no evidence of metastatic disease and no change in the pelvic nodule.  He will return for an office visit and CEA in approximately 8 months.  He has elevated liver enzymes, mild pancytopenia, and CT findings that may indicate cirrhosis.  Recommendations: 1. Add CEA to labs from this hospital admission 2. Outpatient follow-up at the Cancer center in approximately 8 months 3. Evaluation of elevated liver enzymes and possible Cirrhosis per cardiology   LOS: 1 day   [name], MD   05/11/2016, 8:27 AM

## 2016-05-11 NOTE — Progress Notes (Signed)
Subjective: No complaints, no pains, he would like to go home.  Objective: Vital signs in last 24 hours: Temp:  [97.1 F (36.2 C)-98.1 F (36.7 C)] 97.7 F (36.5 C) (06/06 0438) Pulse Rate:  [70-72] 72 (06/06 0438) Resp:  [18-20] 20 (06/06 0438) BP: (93-106)/(51-66) 93/51 mmHg (06/06 0438) SpO2:  [96 %-100 %] 100 % (06/06 0438) Weight:  [85.8 kg (189 lb 2.5 oz)] 85.8 kg (189 lb 2.5 oz) (06/06 0438) Last BM Date: 05/11/16 Afebrile, VSS Bilirubin is up some AST/ALT improving WBC is normal  Recent Labs Lab 05/09/16 2343 05/10/16 0829 05/11/16 0602  AST 185* 579* 317*  ALT 141* 352* 327*  ALKPHOS 219* 218* 232*  BILITOT 2.2* 3.2* 4.4*  PROT 7.6 7.6 7.4  ALBUMIN 3.2* 3.1* 3.0*   Intake/Output from previous day: 06/05 0701 - 06/06 0700 In: 560 [P.O.:460; IV Piggyback:100] Out: 1095 [Urine:1095] Intake/Output this shift: Total I/O In: 120 [P.O.:120] Out: 400 [Urine:400]  General appearance: alert, cooperative and no distress GI: soft, non-tender; bowel sounds normal; no masses,  no organomegaly  Lab Results:   Recent Labs  05/09/16 2343 05/11/16 0602  WBC 8.1 4.5  HGB 11.1* 11.2*  HCT 33.8* 34.9*  PLT 145* 136*    BMET  Recent Labs  05/10/16 0415 05/11/16 0602  NA 136 138  K 3.4* 3.7  CL 102 102  CO2 24 25  GLUCOSE 245* 149*  BUN 42* 38*  CREATININE 2.65* 2.45*  CALCIUM 9.2 9.6   PT/INR  Recent Labs  05/10/16 0140  LABPROT 18.3*  INR 1.51*     Recent Labs Lab 05/09/16 2343 05/10/16 0829 05/11/16 0602  AST 185* 579* 317*  ALT 141* 352* 327*  ALKPHOS 219* 218* 232*  BILITOT 2.2* 3.2* 4.4*  PROT 7.6 7.6 7.4  ALBUMIN 3.2* 3.1* 3.0*     Lipase     Component Value Date/Time   LIPASE 49 05/09/2016 2343     Studies/Results: Dg Chest 2 View  05/10/2016  CLINICAL DATA:  60 year old male with right chest pain. EXAM: CHEST  2 VIEW COMPARISON:  Radiograph dated 09/30/2015 FINDINGS: Two views of the chest demonstrate clear lungs with  sharp costophrenic angles. There is no pneumothorax. Stable mild cardiomegaly. Left pectoral AICD device. There is osteopenia with degenerative changes of the spine. No acute fracture identified. IMPRESSION: No active cardiopulmonary disease. Electronically Signed   By: Anner Crete M.D.   On: 05/10/2016 00:33   US Abdomen Complete  05/10/2016  CLINICAL DATA:  Upper abdominal pain.  Elevated LFTs. EXAM: ABDOMEN ULTRASOUND COMPLETE COMPARISON:  Abdominal CT 05/05/2016 FINDINGS: Gallbladder: Cholelithiasis. There is gallbladder wall thickening which is attributed to underdistention. No focal tenderness or pericholecystic inflammation. Common bile duct: Diameter: 4 mm. Where visualized, no filling defect. Liver: Coarsened echogenic liver correlating with cirrhotic findings on previous CT. No focal finding. Antegrade flow in the imaged portal venous system. No ascites. IVC: No abnormality visualized. Pancreas: Obscured.  No significant finding on recent CT. Spleen: Size and appearance within normal limits. Right Kidney: Length: 10 cm. Echogenicity within normal limits. No mass or hydronephrosis visualized. Left Kidney: Length: 10 cm. Echogenicity within normal limits. No mass or hydronephrosis visualized. Abdominal aorta: No aneurysm visualized. IMPRESSION: 1. Cholelithiasis without acute cholecystitis. 2. Cirrhosis without ascites. 3. Obscured pancreas. Electronically Signed   By: Monte Fantasia M.D.   On: 05/10/2016 02:49    Medications: . apixaban  5 mg Oral BID  . aspirin EC  81 mg Oral Daily  .  ferrous sulfate  325 mg Oral BID WC  . insulin aspart  0-9 Units Subcutaneous TID WC  . insulin glargine  20 Units Subcutaneous Daily  . metoprolol succinate  12.5 mg Oral BID  . omega-3 acid ethyl esters  1 g Oral Daily  . pantoprazole  40 mg Oral Daily  . piperacillin-tazobactam (ZOSYN)  IV  3.375 g Intravenous Q8H  . torsemide  40 mg Oral q1800  . torsemide  80 mg Oral Q breakfast      Assessment/Plan Cholelithiasis with elevated LFT's Chest pain Significant CAD with stents/NSTEMI 04/2015 Ischemic cardiomyopathy EF 20- 35% Rectal cancer AODM  FEN:  Clear liquids ID:  Zosyn DVT:  Apixaban    Plan:  As long as he is non tender and symptomatic we can hold off on doing anything.  If he get worse he would need a percutaneous drain from IR.     LOS: 1 day    Markeia Harkless 05/11/2016 [phone]

## 2016-05-12 LAB — CBC
HEMATOCRIT: 36.6 % — AB (ref 39.0–52.0)
HEMOGLOBIN: 12 g/dL — AB (ref 13.0–17.0)
MCH: 31.3 pg (ref 26.0–34.0)
MCHC: 32.8 g/dL (ref 30.0–36.0)
MCV: 95.6 fL (ref 78.0–100.0)
Platelets: 135 10*3/uL — ABNORMAL LOW (ref 150–400)
RBC: 3.83 MIL/uL — AB (ref 4.22–5.81)
RDW: 14.2 % (ref 11.5–15.5)
WBC: 4.6 10*3/uL (ref 4.0–10.5)

## 2016-05-12 LAB — COMPREHENSIVE METABOLIC PANEL
ALBUMIN: 3 g/dL — AB (ref 3.5–5.0)
ALT: 245 U/L — AB (ref 17–63)
AST: 153 U/L — AB (ref 15–41)
Alkaline Phosphatase: 218 U/L — ABNORMAL HIGH (ref 38–126)
Anion gap: 14 (ref 5–15)
BILIRUBIN TOTAL: 2.6 mg/dL — AB (ref 0.3–1.2)
BUN: 33 mg/dL — AB (ref 6–20)
CO2: 26 mmol/L (ref 22–32)
CREATININE: 2.66 mg/dL — AB (ref 0.61–1.24)
Calcium: 9.7 mg/dL (ref 8.9–10.3)
Chloride: 97 mmol/L — ABNORMAL LOW (ref 101–111)
GFR calc Af Amer: 28 mL/min — ABNORMAL LOW (ref >60)
GFR, EST NON AFRICAN AMERICAN: 24 mL/min — AB (ref >60)
GLUCOSE: 179 mg/dL — AB (ref 65–99)
POTASSIUM: 3.7 mmol/L (ref 3.5–5.1)
Sodium: 137 mmol/L (ref 135–145)
TOTAL PROTEIN: 8 g/dL (ref 6.5–8.1)

## 2016-05-12 LAB — GLUCOSE, CAPILLARY
GLUCOSE-CAPILLARY: 168 mg/dL — AB (ref 65–99)
Glucose-Capillary: 322 mg/dL — ABNORMAL HIGH (ref 65–99)

## 2016-05-12 LAB — CEA: CEA: 2 ng/mL (ref 0.0–4.7)

## 2016-05-12 IMAGING — CT CT HEAD W/O CM
1 of 2 series · 16 of 30 positions shown, 20 images · non-contrast
Comparison: [DATE], 12/28/2014

CLINICAL DATA: Subsequent evaluation of subarachnoid hemorrhage,
fell on [REDACTED], patient complaining of headache and altered
mental status

EXAM:
CT HEAD WITHOUT CONTRAST
TECHNIQUE: Contiguous axial images were obtained from the base of the skull
through the vertex without intravenous contrast.

[Series 4: headtrauma 4.8 h37s · axial · 0.43mm/px · z∈[+102,+262]mm · 16 of 36 slices shown, 20 images]
[im 2/36  brain]
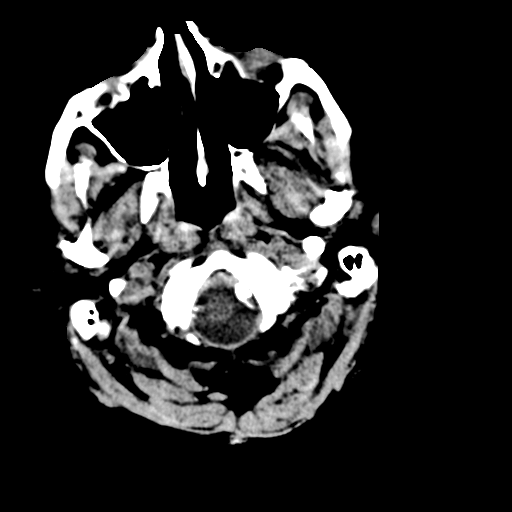
[im 2/36  bone]
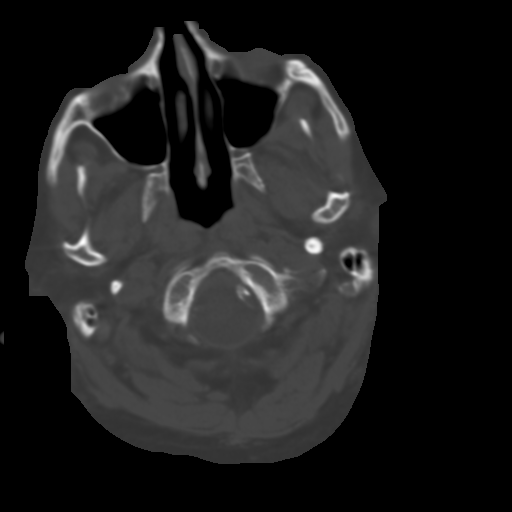
[im 4/36  brain]
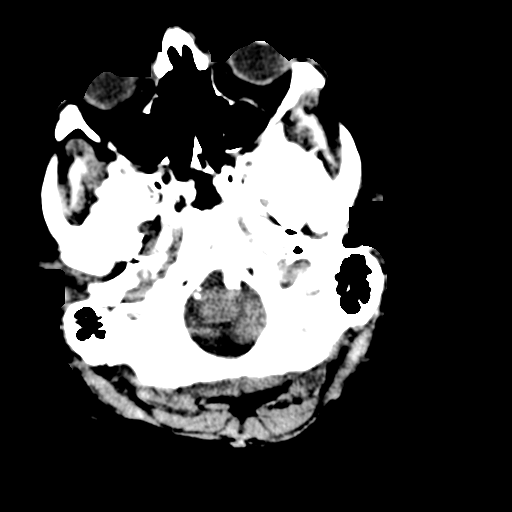
[im 7/36  brain]
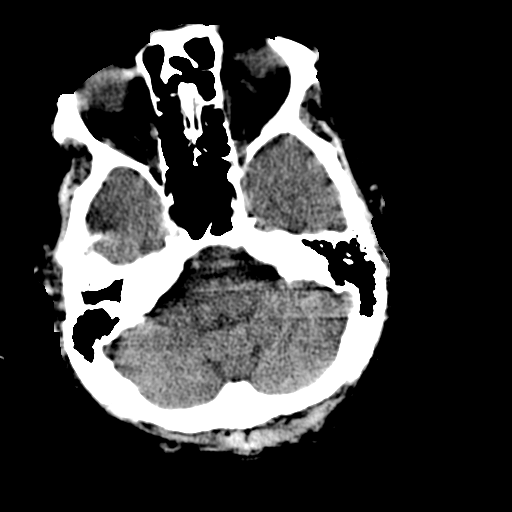
[im 9/36  brain]
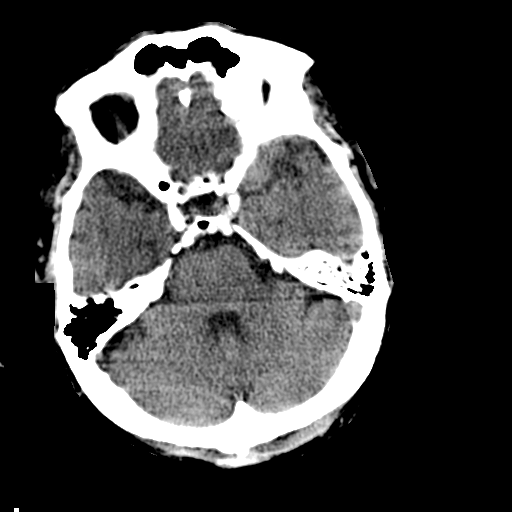
[im 11/36  brain]
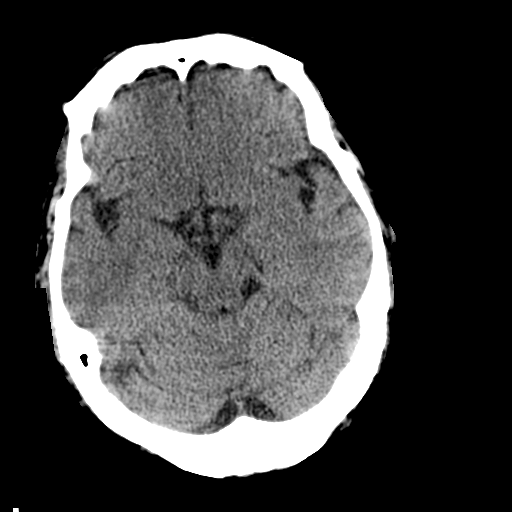
[im 11/36  bone]
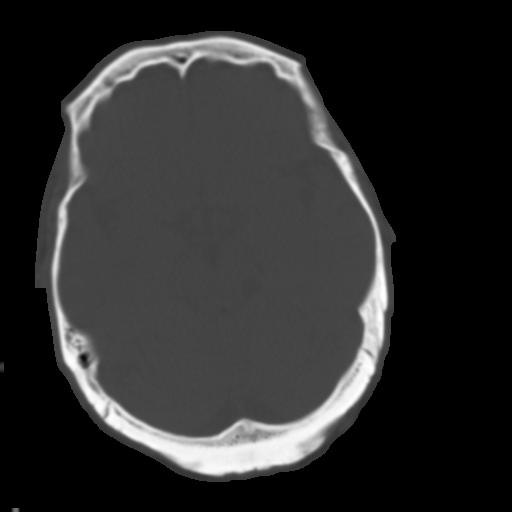
[im 12/36  brain]
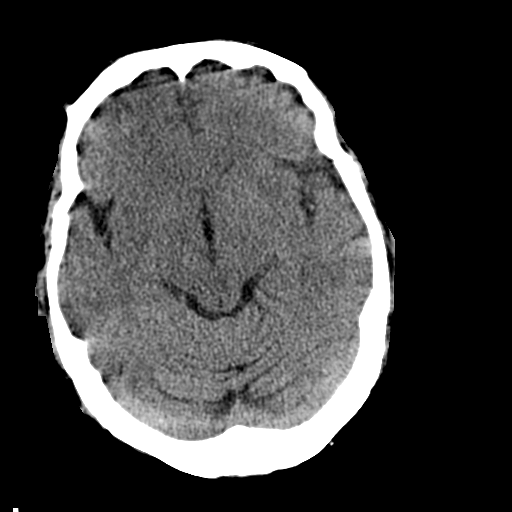
[im 16/36  brain]
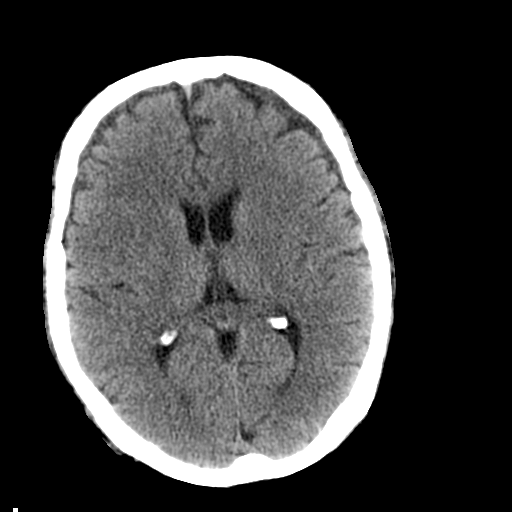
[im 17/36  brain]
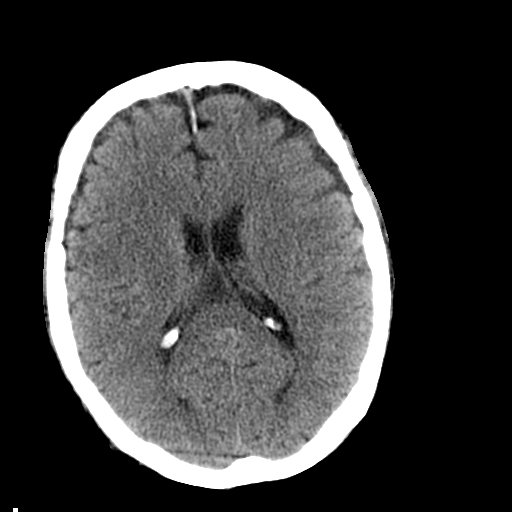
[im 19/36  brain]
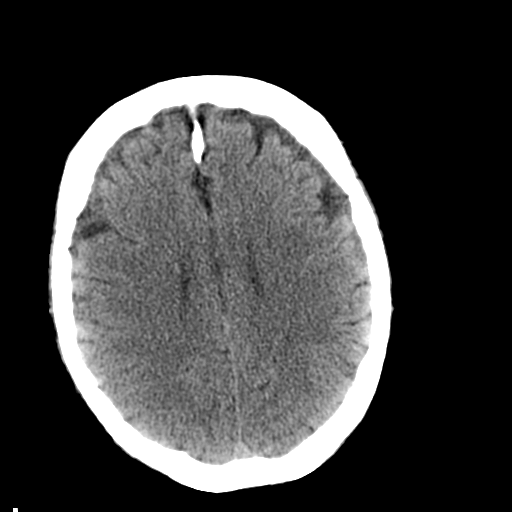
[im 19/36  bone]
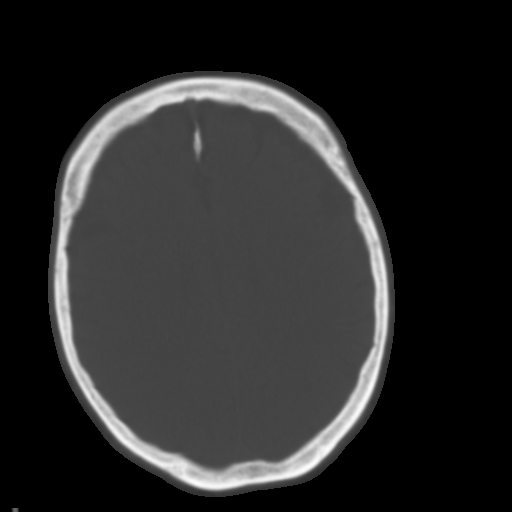
[im 21/36  brain]
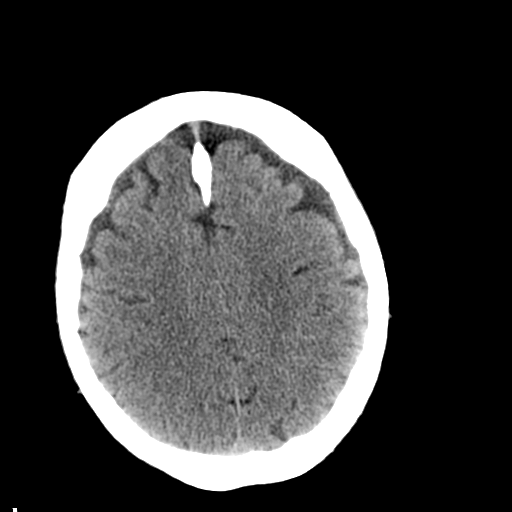
[im 24/36  brain]
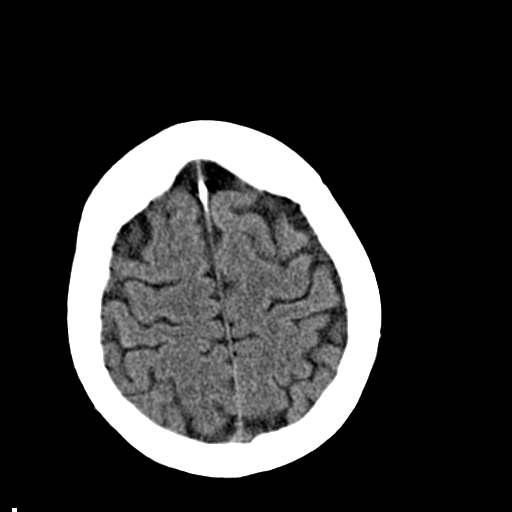
[im 26/36  brain]
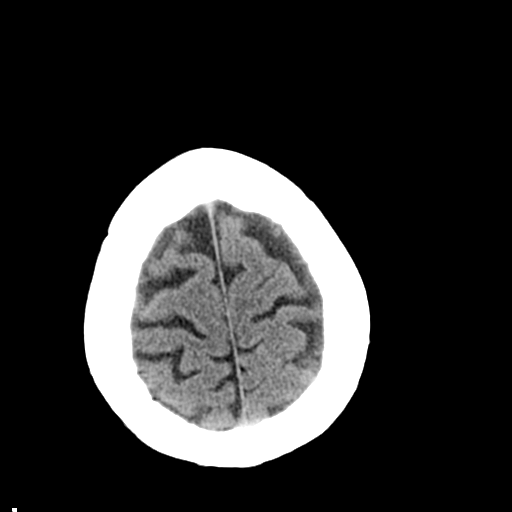
[im 27/36  brain]
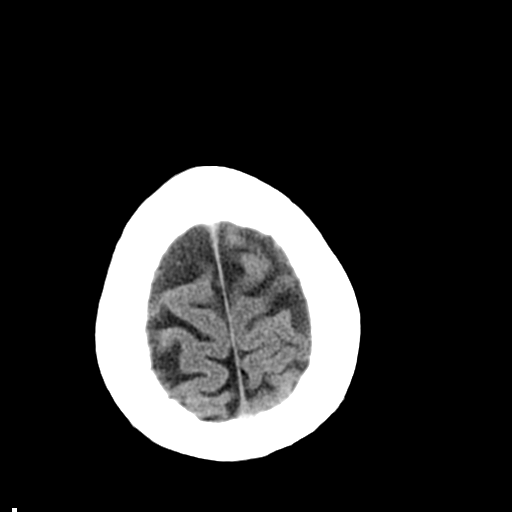
[im 27/36  bone]
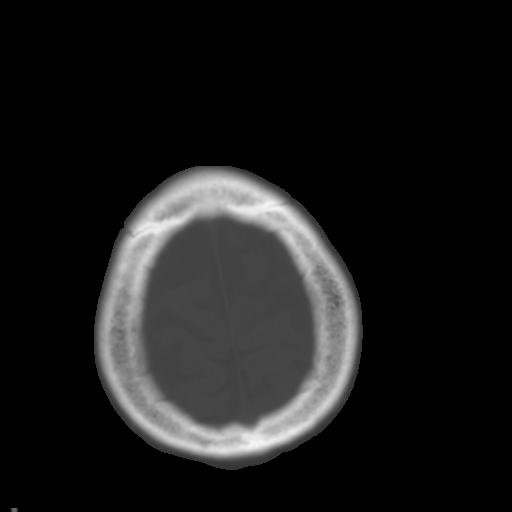
[im 29/36  brain]
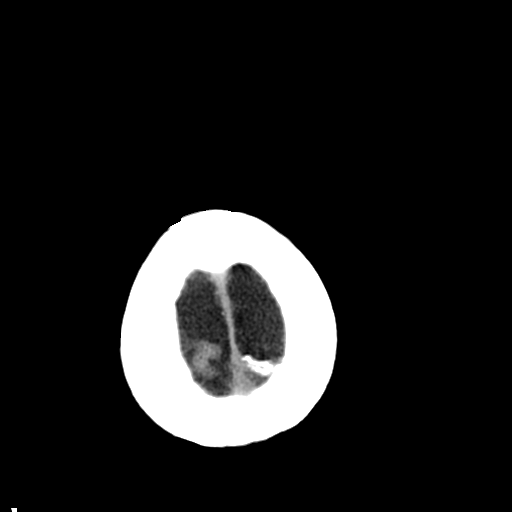
[im 32/36  brain]
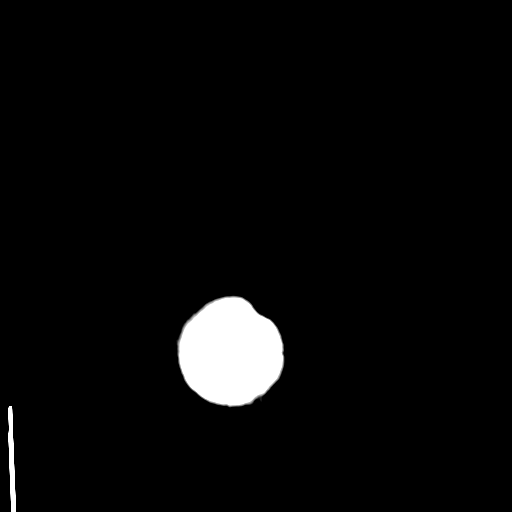
[im 34/36  brain]
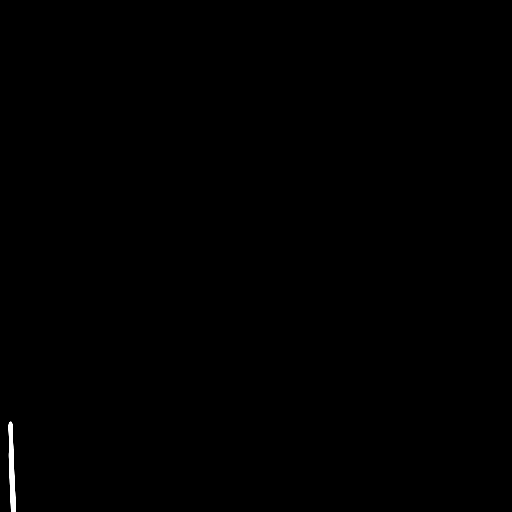

[16 of 30 positions shown; findings below may reference images not displayed]

FINDINGS: The study is limited by mild motion artifact. The extra-axial fluid
previously seen in the quadrigeminal and ambient cisterns is not
identified currently suggesting complete resolution. There is no
parenchymal hemorrhage or extra-axial fluid on the current
examination. There is no evidence of infarct or mass. There is no
hydrocephalus. Stable mild atrophy. The calvarium is intact.
Visualized portions of the paranasal sinuses unremarkable.
Atherosclerotic vascular calcifications again identified.
IMPRESSION: No acute findings. Interval resolution of previous extra-axial
hemorrhage.

## 2016-05-12 MED ORDER — TRAMADOL HCL 50 MG PO TABS: 50.0000 mg | ORAL_TABLET | Freq: Four times a day (QID) | ORAL | Status: DC | PRN

## 2016-05-12 MED ORDER — ASPIRIN 81 MG PO TBEC: 81.0000 mg | DELAYED_RELEASE_TABLET | Freq: Every day | ORAL | Status: DC

## 2016-05-12 NOTE — Discharge Summary (Addendum)
Physician Discharge Summary  BREKKEN BEACH MRN: [id number] DOB/AGE: 62-Aug-1957 62 y.o.  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 traMADol (ULTRAM) 50 MG tablet Take 1 tablet (50 mg total) by mouth every 6 (six) hours as needed. Qty: 30 tablet, Refills: 1     !! - Potential duplicate medications found. Please discuss with provider.    CONTINUE these medications which have NOT CHANGED   Details  apixaban (ELIQUIS) 5 MG TABS tablet Take 1 tablet (5 mg total) by mouth 2 (two) times daily. Qty: 60 tablet, Refills: 3    atorvastatin (LIPITOR) 80 MG tablet Take 1 tablet (80 mg total) by mouth daily. Qty: 30 tablet, Refills: 6    co-enzyme Q-10 50 MG capsule Take 50 mg by mouth every morning.     ferrous sulfate 325 (65 FE) MG tablet Take 1 tablet (325 mg total) by mouth 2 (two) times daily with a meal. Qty: 60 tablet,  Refills: 0    fluticasone (FLONASE) 50 MCG/ACT nasal spray Place 1 spray into both nostrils daily as needed for allergies.     insulin glargine (LANTUS) 100 UNIT/ML injection Inject 30 Units into the skin every morning.    metoprolol succinate (TOPROL-XL) 25 MG 24 hr tablet Take 0.5 tablets (12.5 mg total) by mouth 2 (two) times daily. Qty: 45 tablet, Refills: 3   Associated Diagnoses: Chronic systolic heart failure (HCC)    nitroGLYCERIN (NITROLINGUAL) 0.4 MG/SPRAY spray Place 1 spray under the tongue every 5 (five) minutes x 3 doses as needed for chest pain.    Omega-3 Fatty Acids (FISH OIL) 1000 MG CAPS Take 1,000 mg by mouth 2 (two) times daily.    pantoprazole (PROTONIX) 40 MG tablet Take 1 tablet (40 mg total) by mouth daily. Qty: 30 tablet, Refills: 0    potassium chloride SA (K-DUR,KLOR-CON) 20 MEQ tablet Take 20 mEq by mouth daily.    PROAIR RESPICLICK 765 (90 BASE) MCG/ACT AEPB Inhale 2 puffs into the lungs every 6 (six) hours as needed (for breathing).  Refills: 0    torsemide (DEMADEX) 20 MG tablet Take 4 tablets by mouth every morning and 2 tables by mouth every evening Qty: 180 tablet, Refills: 3    !! traMADol (ULTRAM) 50 MG tablet Take 1 tablet (50 mg total) by mouth every 6 (six) hours as needed. Qty: 15  tablet, Refills: 0     !! - Potential duplicate medications found. Please discuss with provider.         Discharge Condition: Stable    Discharge Instructions Get Medicines reviewed and adjusted: Please take all your medications with you for your next visit with your Primary MD  Please request your Primary MD to go over all hospital tests and procedure/radiological results at the follow up, please ask your Primary MD to get all Hospital records sent to his/her office.  If you experience worsening of your admission symptoms, develop shortness of breath, life threatening emergency, suicidal or homicidal thoughts you must seek medical attention immediately  by calling 911 or calling your MD immediately if symptoms less severe.  You must read complete instructions/literature along with all the possible adverse reactions/side effects for all the Medicines you take and that have been prescribed to you. Take any new Medicines after you have completely understood and accpet all the possible adverse reactions/side effects.   Do not drive when taking Pain medications.   Do not take more than prescribed Pain, Sleep and Anxiety Medications  Special Instructions: If you have smoked or chewed Tobacco in the last 2 yrs please stop smoking, stop any regular Alcohol and or any Recreational drug use.  Wear Seat belts while driving.  Please note  You were cared for by a hospitalist during your hospital stay. Once you are discharged, your primary care physician will handle any further medical issues. Please note that NO REFILLS for any discharge medications will be authorized once you are discharged, as it is imperative that you return to your primary care physician (or establish a relationship with a primary care physician if you do not have one) for your aftercare needs so that they can reassess your need for medications and monitor your lab values.  Discharge Instructions    AMB Referral to [facility] Care Management    Complete by:  As directed   Reason for consult:  Post hospital follow up  Diagnoses of:  Heart Failure  Expected date of contact:  1-3 days (reserved for hospital discharges)  Please assign to community nurse for transition of care calls and assess for home visits. Confirmed contact numbers [phone] is the cell phone number.  Wife remains his contact person, consent on file.  Questions please call:   Natividad Brood, RN BSN [facility] Liaison  [phone] business mobile phone Toll free office [phone]            No Known Allergies    Disposition: 01-Home or Self Care   Consults:  General  surgery Cardiology      Significant Diagnostic Studies:  Ct Abdomen Pelvis Wo Contrast  05/05/2016  CLINICAL DATA:  60 year old male with history of rectal cancer diagnosed in 2008 status post surgical resection, chemotherapy and radiation therapy now complete. Follow-up for right perirectal soft tissue density. EXAM: CT ABDOMEN AND PELVIS WITHOUT CONTRAST TECHNIQUE: Multidetector CT imaging of the abdomen and pelvis was performed following the standard protocol without IV contrast. COMPARISON:  Multiple priors, most recently CT of the abdomen and pelvis 07/31/2015. FINDINGS: Lower chest: Cardiomegaly. Atherosclerotic calcifications in the left anterior descending, left circumflex and right coronary arteries. 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 Pacemaker leads in the right atrium, terminating in the right ventricular apex, and extending into the coronary veins via the coronary sinus. Hepatobiliary: Liver has a shrunken  appearance and slightly nodular contour, suggestive of underlying cirrhosis. No definite cystic or solid hepatic lesion is confidently identified on today's noncontrast CT examination. Unenhanced appearance of the gallbladder is normal. Pancreas: No pancreatic mass or peripancreatic inflammatory changes are identified on today's noncontrast CT examination. Spleen: Unremarkable. Adrenals/Urinary Tract: Several tiny calcifications are noted within the collecting systems of the kidneys bilaterally, measuring up to 4 mm in the interpolar collecting system of the left kidney, likely to represent nonobstructive calculi) alternatively, some may represent vascular calcifications). No calcifications are identified along the course of either ureter or within the lumen of the urinary bladder. No hydroureteronephrosis to indicate urinary tract obstruction at this time.  Bilateral perinephric stranding is noted (nonspecific). Unenhanced appearance of the kidneys is otherwise unremarkable. Unenhanced appearance of the adrenal glands is normal. Unenhanced appearance of the urinary bladder is unremarkable. Stomach/Bowel: Unenhanced appearance of the stomach is normal. There is no pathologic dilatation of small bowel or colon. Normal appendix. Status post resection of the rectum with left lower quadrant colostomy. There continues to be extensive thickening of the presacral soft tissues which is somewhat mass-like in appearance, but appears essentially unchanged compared to prior study from 07/31/2015, presumably an area of benign postoperative scarring. This measures up to 3.0 x 5.3 cm (axial image 73 of series 2). Likewise, there is a small amount of architectural distortion in the right side of the ischiorectal fossa where there is again some nodular soft tissue prominence in an area that is typically occupied by a nothing but ischiorectal fat. This is best demonstrated on image 78 of series 2 where this area of soft tissue prominence measures approximately 1.9 x 2.5 cm, slightly less apparent than the prior study from 07/31/2015, presumably an area of postoperative scarring. Vascular/Lymphatic: Atherosclerosis throughout the abdominal and pelvic vasculature, without evidence of aneurysm. No lymphadenopathy noted in the abdomen or pelvis on today's noncontrast CT examination. Reproductive: Fiducial markers in or immediately posterior to the prostate gland. Prostate gland is otherwise unremarkable in appearance. Seminal vesicles are not well visualized, likely distorted by postoperative and post treatment related scarring. Other: No significant volume of ascites.  No pneumoperitoneum. Musculoskeletal: There are no aggressive appearing lytic or blastic lesions noted in the visualized portions of the skeleton. IMPRESSION: 1. Stable post treatment related changes from prior surgery and  radiation therapy in the low anatomic pelvis, as discussed above. No definite findings to suggest local recurrence of disease or metastatic disease in the abdomen or pelvis on today's noncontrast CT examination. 2. Morphologic changes in the liver suggestive of underlying cirrhosis. 3. Multiple tiny nonobstructive calculi in the collecting systems of the kidneys bilaterally. 4. Extensive atherosclerosis, including at least 3 vessel coronary artery disease. 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 5. Additional incidental findings, as above. Electronically Signed   By: Vinnie Langton M.D.   On: 05/05/2016 09:00   Dg Chest 2 View  05/10/2016  CLINICAL DATA:  60 year old male with right chest pain. EXAM: CHEST  2 VIEW COMPARISON:  Radiograph dated 09/30/2015 FINDINGS: Two views of the chest demonstrate clear lungs with sharp costophrenic angles. There is no pneumothorax. Stable mild cardiomegaly. Left pectoral AICD device. There is osteopenia with degenerative changes of the spine. No acute fracture identified. IMPRESSION: No active cardiopulmonary disease. Electronically Signed   By: Anner Crete M.D.   On: 05/10/2016 00:33   US Abdomen Complete  05/10/2016  CLINICAL DATA:  Upper abdominal pain.  Elevated LFTs. EXAM: ABDOMEN ULTRASOUND COMPLETE COMPARISON:  Abdominal CT 05/05/2016 FINDINGS: Gallbladder: Cholelithiasis. There is gallbladder wall thickening which is attributed to underdistention. No focal tenderness or pericholecystic inflammation. Common bile duct: Diameter: 4 mm. Where visualized, no filling defect. Liver: Coarsened echogenic liver correlating with cirrhotic findings on previous CT. No focal finding. Antegrade flow in the imaged portal venous system. No ascites. IVC: No abnormality visualized. Pancreas: Obscured.  No significant finding on recent CT. Spleen: Size and appearance within normal  limits. Right Kidney: Length: 10 cm. Echogenicity within normal limits. No mass or hydronephrosis visualized. Left Kidney: Length: 10 cm. Echogenicity within normal limits. No mass or hydronephrosis visualized. Abdominal aorta: No aneurysm visualized. IMPRESSION: 1. Cholelithiasis without acute cholecystitis. 2. Cirrhosis without ascites. 3. Obscured pancreas. Electronically Signed   By: Monte Fantasia M.D.   On: 05/10/2016 02:49        Filed Weights   05/10/16 0458 05/11/16 0438 05/12/16 0440  Weight: 86.728 kg (191 lb 3.2 oz) 85.8 kg (189 lb 2.5 oz) 85.503 kg (188 lb 8 oz)     Microbiology: No results found for this or any previous visit (from the past 240 hour(s)).     Blood Culture    Component Value Date/Time   SDES WOUND RIGHT ARM 09/26/2015 1905   SPECREQUEST Normal 09/26/2015 1905   CULT  09/26/2015 1905    MODERATE STAPHYLOCOCCUS SPECIES (COAGULASE NEGATIVE) Performed at [facility] 09/29/2015 FINAL 09/26/2015 1905      Labs: Results for orders placed or performed during the hospital encounter of 05/09/16 (from the past 48 hour(s))  Troponin I     Status: None   Collection Time: 05/10/16  2:32 PM  Result Value Ref Range   Troponin I 0.03 <0.031 ng/mL    Comment:        NO INDICATION OF MYOCARDIAL INJURY.   Glucose, capillary     Status: Abnormal   Collection Time: 05/10/16  4:50 PM  Result Value Ref Range   Glucose-Capillary 145 (H) 65 - 99 mg/dL  Glucose, capillary     Status: Abnormal   Collection Time: 05/11/16  4:15 AM  Result Value Ref Range   Glucose-Capillary 162 (H) 65 - 99 mg/dL  Comprehensive metabolic panel     Status: Abnormal   Collection Time: 05/11/16  6:02 AM  Result Value Ref Range   Sodium 138 135 - 145 mmol/L   Potassium 3.7 3.5 - 5.1 mmol/L   Chloride 102 101 - 111 mmol/L   CO2 25 22 - 32 mmol/L   Glucose, Bld 149 (H) 65 - 99 mg/dL   BUN 38 (H) 6 - 20 mg/dL   Creatinine, Ser 2.45 (H) 0.61 - 1.24 mg/dL    Calcium 9.6 8.9 - 10.3 mg/dL   Total Protein 7.4 6.5 - 8.1 g/dL   Albumin 3.0 (L) 3.5 - 5.0 g/dL   AST 317 (H) 15 - 41 U/L   ALT 327 (H) 17 - 63 U/L   Alkaline Phosphatase 232 (H) 38 - 126 U/L   Total Bilirubin 4.4 (H) 0.3 - 1.2 mg/dL   GFR calc non Af Amer 27 (L) >60 mL/min   GFR calc Af Amer 31 (L) >60 mL/min    Comment: (NOTE) The eGFR has been calculated using the CKD EPI equation. This calculation has not been validated in all clinical situations. eGFR's persistently <60 mL/min signify possible Chronic Kidney Disease.    Anion gap 11  5 - 15  CK     Status: Abnormal   Collection Time: 05/11/16  6:02 AM  Result Value Ref Range   Total CK 27 (L) 49 - 397 U/L  CBC     Status: Abnormal   Collection Time: 05/11/16  6:02 AM  Result Value Ref Range   WBC 4.5 4.0 - 10.5 K/uL   RBC 3.64 (L) 4.22 - 5.81 MIL/uL   Hemoglobin 11.2 (L) 13.0 - 17.0 g/dL   HCT 34.9 (L) 39.0 - 52.0 %   MCV 95.9 78.0 - 100.0 fL   MCH 30.8 26.0 - 34.0 pg   MCHC 32.1 30.0 - 36.0 g/dL   RDW 14.1 11.5 - 15.5 %   Platelets 136 (L) 150 - 400 K/uL  Glucose, capillary     Status: Abnormal   Collection Time: 05/11/16  6:09 AM  Result Value Ref Range   Glucose-Capillary 152 (H) 65 - 99 mg/dL   Comment 1 Notify RN   Glucose, capillary     Status: Abnormal   Collection Time: 05/11/16  8:14 AM  Result Value Ref Range   Glucose-Capillary 159 (H) 65 - 99 mg/dL  CEA     Status: None   Collection Time: 05/11/16  8:48 AM  Result Value Ref Range   CEA 2.0 0.0 - 4.7 ng/mL    Comment: (NOTE)       Roche ECLIA methodology       Nonsmokers  <3.9                                     Smokers     <5.6 Performed At: [facility] [place], [place] [id number] Lindon Romp MD NW:[id number]   Glucose, capillary     Status: Abnormal   Collection Time: 05/11/16 11:34 AM  Result Value Ref Range   Glucose-Capillary 208 (H) 65 - 99 mg/dL   Comment 1 Notify RN   Glucose, capillary     Status: Abnormal    Collection Time: 05/11/16  4:32 PM  Result Value Ref Range   Glucose-Capillary 190 (H) 65 - 99 mg/dL  Glucose, capillary     Status: Abnormal   Collection Time: 05/11/16  9:06 PM  Result Value Ref Range   Glucose-Capillary 262 (H) 65 - 99 mg/dL  CBC     Status: Abnormal   Collection Time: 05/12/16  4:58 AM  Result Value Ref Range   WBC 4.6 4.0 - 10.5 K/uL   RBC 3.83 (L) 4.22 - 5.81 MIL/uL   Hemoglobin 12.0 (L) 13.0 - 17.0 g/dL   HCT 36.6 (L) 39.0 - 52.0 %   MCV 95.6 78.0 - 100.0 fL   MCH 31.3 26.0 - 34.0 pg   MCHC 32.8 30.0 - 36.0 g/dL   RDW 14.2 11.5 - 15.5 %   Platelets 135 (L) 150 - 400 K/uL  Comprehensive metabolic panel     Status: Abnormal   Collection Time: 05/12/16  4:58 AM  Result Value Ref Range   Sodium 137 135 - 145 mmol/L   Potassium 3.7 3.5 - 5.1 mmol/L   Chloride 97 (L) 101 - 111 mmol/L   CO2 26 22 - 32 mmol/L   Glucose, Bld 179 (H) 65 - 99 mg/dL   BUN 33 (H) 6 - 20 mg/dL   Creatinine, Ser 2.66 (H) 0.61 - 1.24 mg/dL   Calcium 9.7 8.9 - 10.3  mg/dL   Total Protein 8.0 6.5 - 8.1 g/dL   Albumin 3.0 (L) 3.5 - 5.0 g/dL   AST 153 (H) 15 - 41 U/L   ALT 245 (H) 17 - 63 U/L   Alkaline Phosphatase 218 (H) 38 - 126 U/L   Total Bilirubin 2.6 (H) 0.3 - 1.2 mg/dL   GFR calc non Af Amer 24 (L) >60 mL/min   GFR calc Af Amer 28 (L) >60 mL/min    Comment: (NOTE) The eGFR has been calculated using the CKD EPI equation. This calculation has not been validated in all clinical situations. eGFR's persistently <60 mL/min signify possible Chronic Kidney Disease.    Anion gap 14 5 - 15  Glucose, capillary     Status: Abnormal   Collection Time: 05/12/16  6:19 AM  Result Value Ref Range   Glucose-Capillary 168 (H) 65 - 99 mg/dL     Lipid Panel     Component Value Date/Time   CHOL 163 05/10/2016 0415   TRIG 96 05/10/2016 0415   HDL 27* 05/10/2016 0415   CHOLHDL 6.0 05/10/2016 0415   VLDL 19 05/10/2016 0415   LDLCALC 117* 05/10/2016 0415     Lab Results  Component  Value Date   HGBA1C 11.5* 05/10/2016   HGBA1C 6.9* 08/03/2015   HGBA1C 8.9* 04/05/2015     Lab Results  Component Value Date   LDLCALC 117* 05/10/2016   CREATININE 2.66* 05/12/2016     60 y.o. male with medical history significant of CAD, MI, s/p of stent, chronic mild elevation of troponin, PAF on Eliquis, hyperlipidemia, diabetes mellitus, GERD, ICD placement, TIA, SDH, sCHF with EF 35%, OSA on CPAP, CKD-IV, rectal cancer (s/p of colotomy), prostate cancer (s/p of seed with chemotherapy and XRT), chronic cholecystitis, chronic hypotension, who presents with chest pain.  Chest x-ray negative, liver functions mildly elevated, abnormal AST/ALT. CT abdomen pelvis 5/31 ruled out metastatic disease, three-vessel coronary artery disease, left ventricular apical aneurysm, ultrasound abdomen showed cholelithiasis without acute cholecystitis.  Assessment and plan Probable Acute cholecystitis: has had intermittent elevated LFT since 4/16. US showed cholelithiasis, probable acute cholecystitis although clinically no pain. No nausea, vomiting, abdominal pain Gen. surgery consulted 6/5,No surgery was recommended, however they did recommend that , if Pain gets worse patient may need a percutaneous drain As  per cardiology  preoperative note.Would be moderate to high risk in the event that intervention is needed given his underlying ischemic cardiomyopathy, chronic systolic heart failure -Avoid using Tylenol/statin HIDA scan Declined by the patient  Slight improvement in AST ALT, Bilirubin and alkaline phosphatase , Recommend lose outpatient follow-up   Chest pain, CAD, chronic elevation of trop: s/p stent. Pt has chronic mild elevation of trop (0.04 to 0.05). Today his trop is 0.04 2. His chest pain has completely resolved now. Chest x-ray is negative for any pneumonia. Patient is on Eliquis,   less likely to have pulmonary embolism.  Telemetry shows paced rhythm - cycle CE q6 x3 and repeat her EKG  in the am  - prn Nitroglycerin, Morphine, metoprolol and lipitor  - Risk factor stratification: LDL 117 - 2d echo Showed EF of 10-15%, cardiology was consulted-Patient did not have any Signs of heart failure . Patient is followed by Dr Jeffie Pollock in the outpatient setting - please call Card in AM-NSTEMI in 5/16.  - Cardiolite without high ischemic burden. Poor cath candidate with renal failure No ASA given Eliquis use.Continue Toprol XL   Chronic systolic HEART failure: 2-D echo on  04/24/15 showed EF 35%, now 10-15%, Patient is taking torsemide currently. BNP slightly elevated at 377, but no leg edema or JVD. CHF is compensated. -Continue torsemide, metoprolol  Atrial Fibrillation: . CHADS2Vasc Score 5. s/p AV nodal ablation needs oral anticoagulation. Patient is on Eliquis at home. INR is 1.51 on admission. Heart rate is well controlled. -continue metoprolol and Eliquis  Chronic hypotension: Blood pressure is soft, 93/51, which is at baseline. Mental status normal. No signs of infection. Likely in the setting of worsening  Chronic kidney disease, stage IV (severe) ([place]): Stable. Baseline creatinine 2.5-3.0. .Followed by Dr Lowanda Foster in [place] -Follow-up renal function by BMP  DM-II: Last A1c 6.9, well controled. Currently on Lantus and sliding scale insulin Continue current regimen Hemoglobin A1c 11.5,  GERD: -Protonix  OSA: -CPAP  HLD: Last LDL was 41 on 03/18/16  -Continue home medications: Lipitor  Stage III rectal cancer, diagnosed in June of 2008-stable    Discharge Exam:   Blood pressure 89/50, pulse 74, temperature 97.4 F (36.3 C), temperature source Oral, resp. rate 18, height '5\' 10"'$  (1.778 m), weight 85.503 kg (188 lb 8 oz), SpO2 100 %.   General exam: Appears calm and comfortable  Respiratory system: Clear to auscultation. Respiratory effort normal. Cardiovascular system: S1 & S2 heard, RRR. No JVD, murmurs, rubs, gallops or clicks. No pedal  edema. Gastrointestinal system: Abdomen is nondistended, soft and nontender. No organomegaly or masses felt. Normal bowel sounds heard. Central nervous system: Alert and oriented. No focal neurological deficits. Extremities: Symmetric 5 x 5 power. Skin: No rashes, lesions or ulcers Psychiatry: Judgement and insight appear normal. Mood & affect appropriate.    Follow-up Information    Follow up with Glo Herring., MD. Schedule an appointment as soon as possible for a visit in 3 days.   Specialty:  Internal Medicine   Contact information:   [address] [place] [address] [phone]       Follow up with Glori Bickers, MD. Schedule an appointment as soon as possible for a visit in 1 week.   Specialty:  Cardiology   Why:   heart failure appointment   Contact information:   [place] [address] [phone]       Signed: Reyne Dumas 05/12/2016, 11:49 AM        Time spent >45 mins

## 2016-05-14 ENCOUNTER — Other Ambulatory Visit: Payer: Self-pay | Admitting: *Deleted

## 2016-05-14 NOTE — Patient Outreach (Signed)
Transition of care call  No answer, was able to leave a voicemail with RNCM contact. Will await call back. Plan to continue to attempt contact. [name]. [name], RN, BSN, [place] [phone]

## 2016-05-17 ENCOUNTER — Other Ambulatory Visit: Payer: Self-pay | Admitting: *Deleted

## 2016-05-18 DIAGNOSIS — Z1389 Encounter for screening for other disorder: Secondary | ICD-10-CM | POA: Diagnosis not present

## 2016-05-18 DIAGNOSIS — I420 Dilated cardiomyopathy: Secondary | ICD-10-CM | POA: Diagnosis not present

## 2016-05-18 DIAGNOSIS — K8020 Calculus of gallbladder without cholecystitis without obstruction: Secondary | ICD-10-CM | POA: Diagnosis not present

## 2016-05-18 DIAGNOSIS — Z6829 Body mass index (BMI) 29.0-29.9, adult: Secondary | ICD-10-CM | POA: Diagnosis not present

## 2016-05-18 DIAGNOSIS — G4733 Obstructive sleep apnea (adult) (pediatric): Secondary | ICD-10-CM | POA: Diagnosis not present

## 2016-05-18 NOTE — Patient Outreach (Signed)
Call to patient for transition of care: Spoke with patient very briefly. Was able to review TOC assessment. Patient stating he has all of his medications. He has follow up appointments. He verbalizes that if he continues to have symptoms they will try to treat him but he is not a good candidate for surgery due to other medical conditions. Reviewed with patient about [facility] program services. Patient states he does not feel like he needs our program and does not want a home visit. He does agree to let [facility] call again next week. Plan to call and assess for further needs and attempt to engage for [facility] program services. [name]. [name], RN, BSN, [place] [phone]

## 2016-05-21 ENCOUNTER — Encounter: Payer: Self-pay | Admitting: Genetic Counselor

## 2016-05-21 IMAGING — CR DG CHEST 1V PORT
1 series · 1 of 1 positions shown · non-contrast
Comparison: 12/29/2014

CLINICAL DATA: Chest pain since this morning.

EXAM:
PORTABLE CHEST - 1 VIEW

[portable]
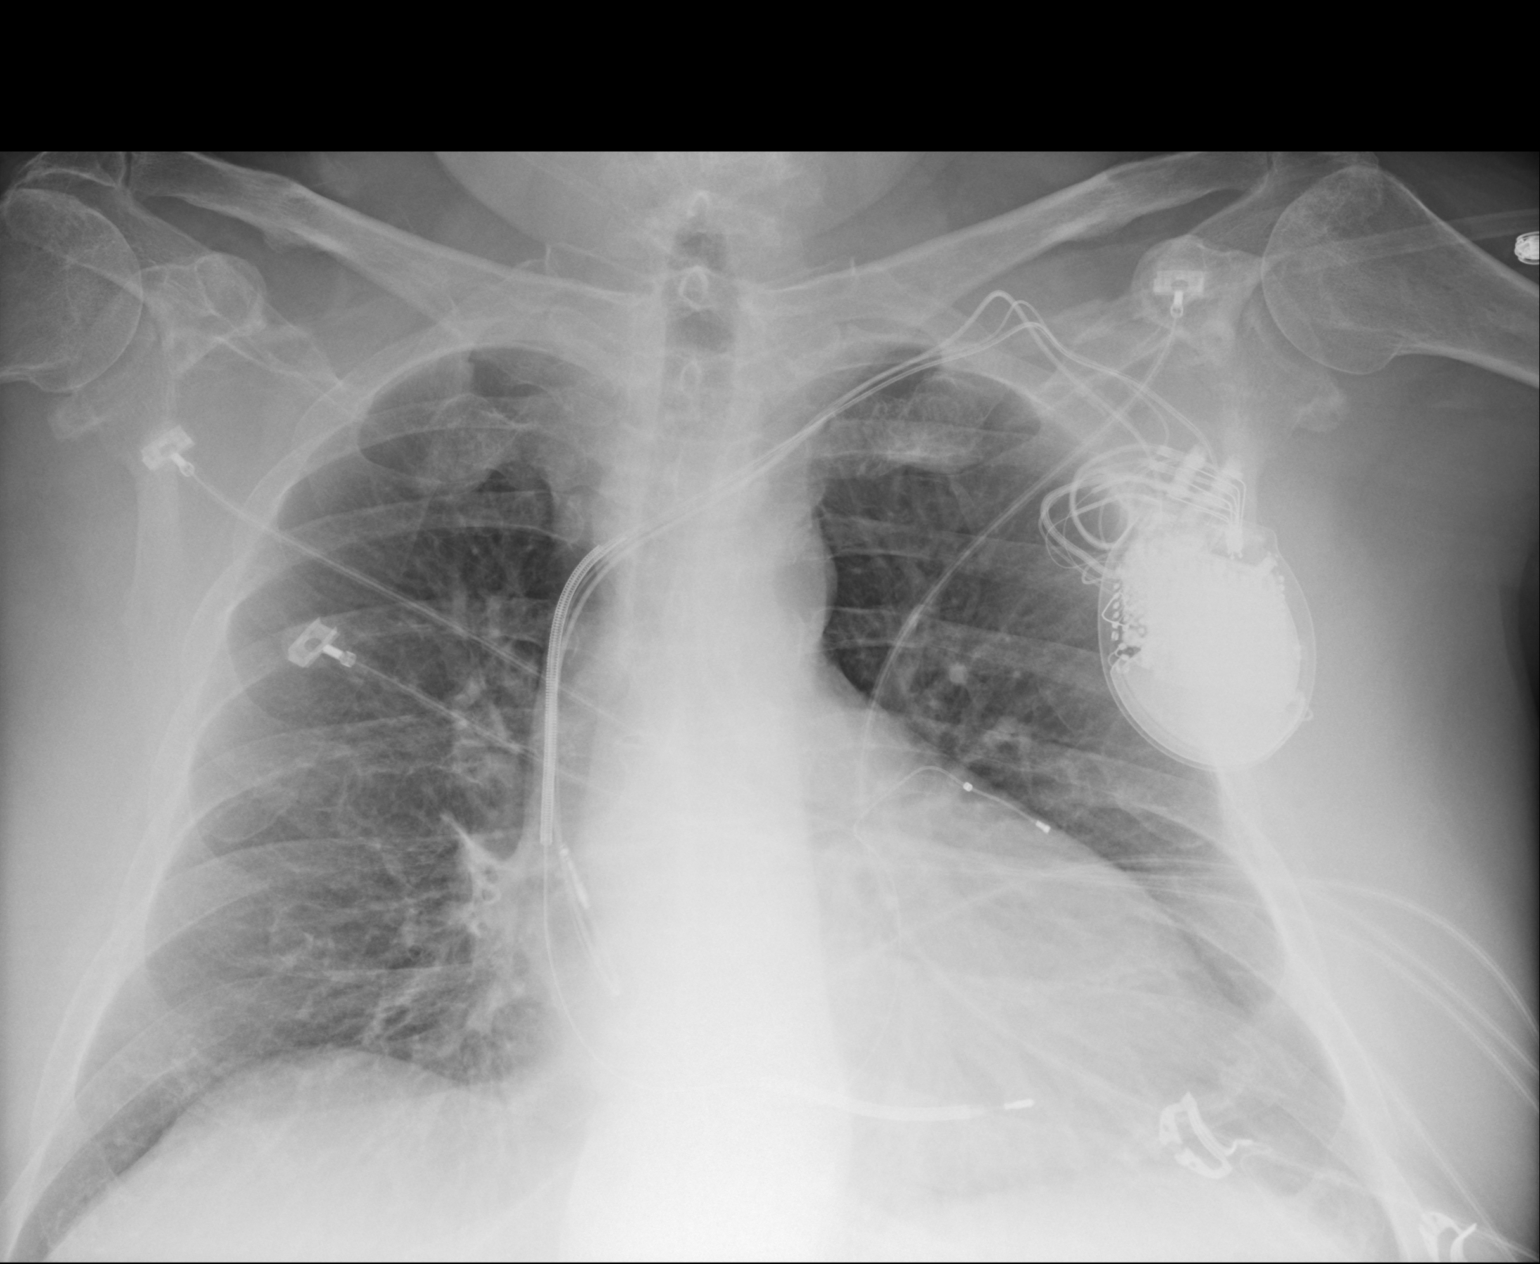

[1 of 1 positions shown; findings below may reference images not displayed]

FINDINGS: Dual lead left-sided pacemaker remains in place. Stable
cardiomegaly. Suspect mild hyperinflation. No pulmonary edema. No
consolidation, pleural effusion, or pneumothorax. No acute osseous
abnormalities are seen. There is degenerative change of both
shoulders.
IMPRESSION: No acute pulmonary process.  Cardiomegaly is stable.

## 2016-05-21 IMAGING — US US ABDOMEN LIMITED
1 series · 14 of 25 positions shown · non-contrast
Comparison: CT scan 05/20/2014

CLINICAL DATA: Right upper quadrant pain for 8 hr

EXAM:
US ABDOMEN LIMITED - RIGHT UPPER QUADRANT

[Series 1: us abdomen limited · 0.20mm/px · 14 of 77 slices shown]
[im 1/77]
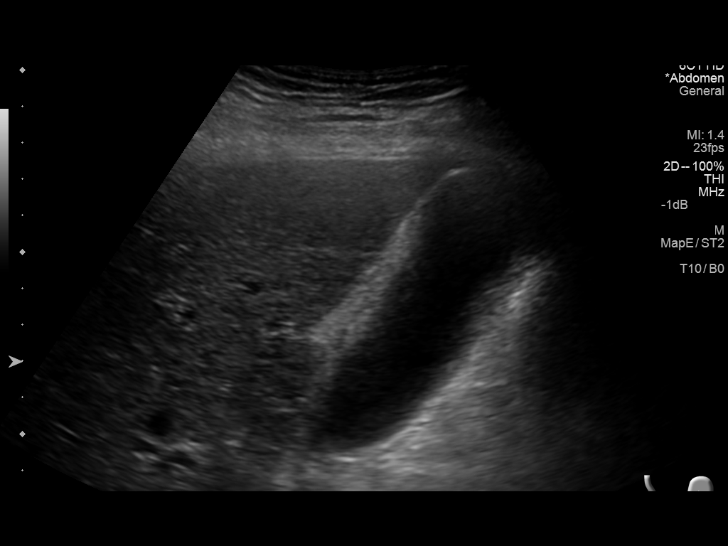
[im 7/77]
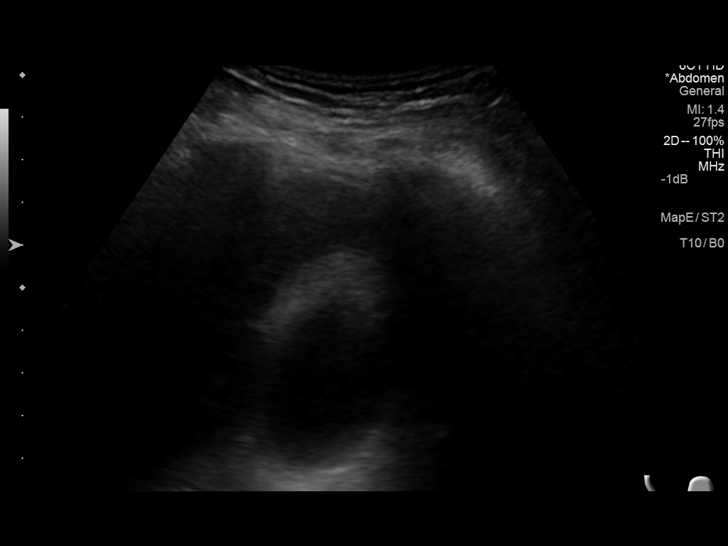
[im 13/77]
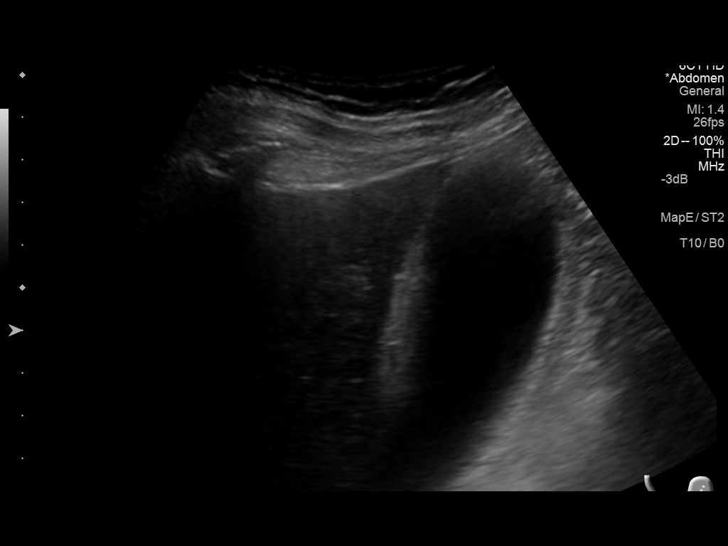
[im 20/77]
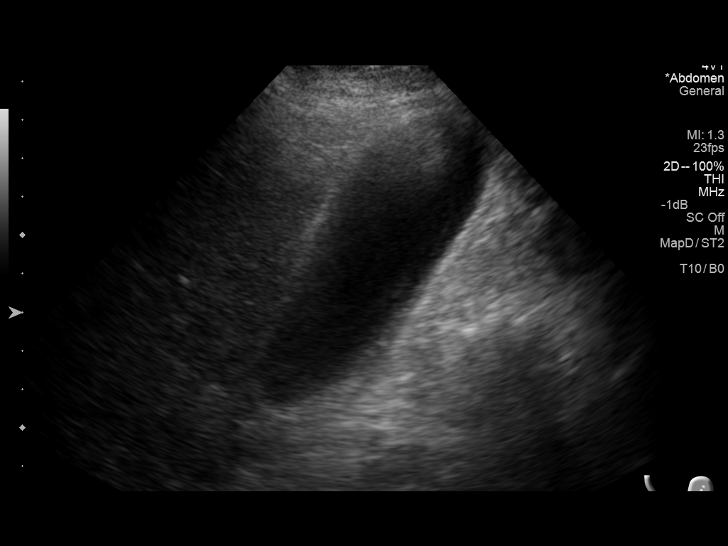
[im 26/77]
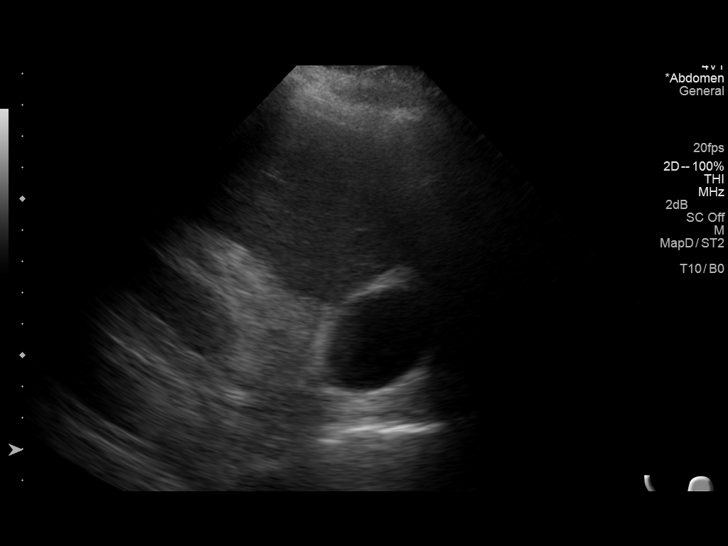
[im 29/77]
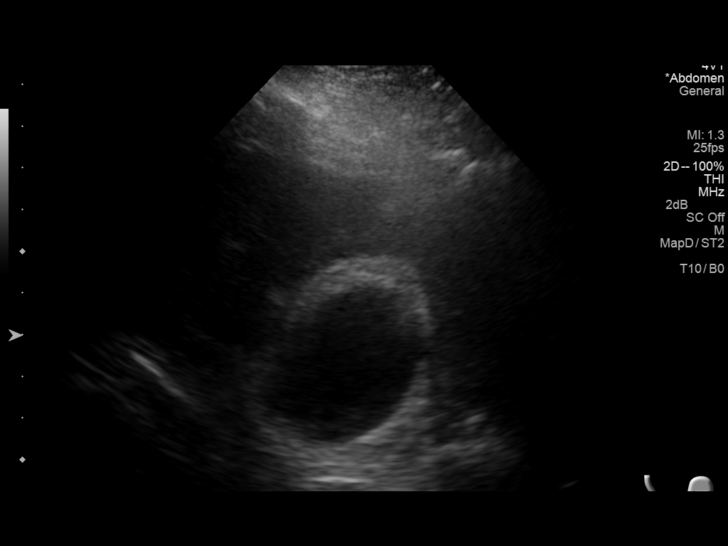
[im 35/77]
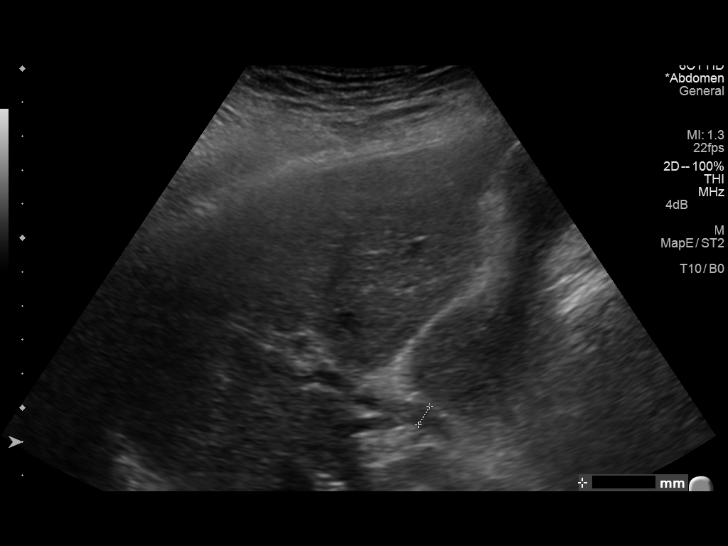
[im 42/77]
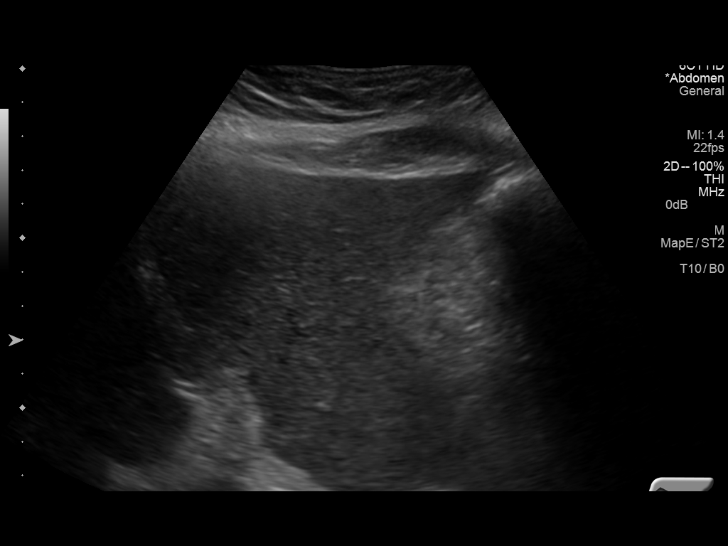
[im 48/77]
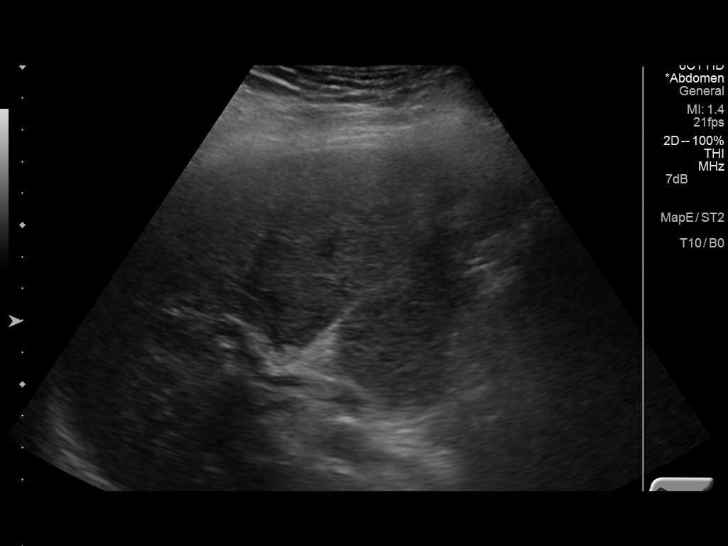
[im 51/77]
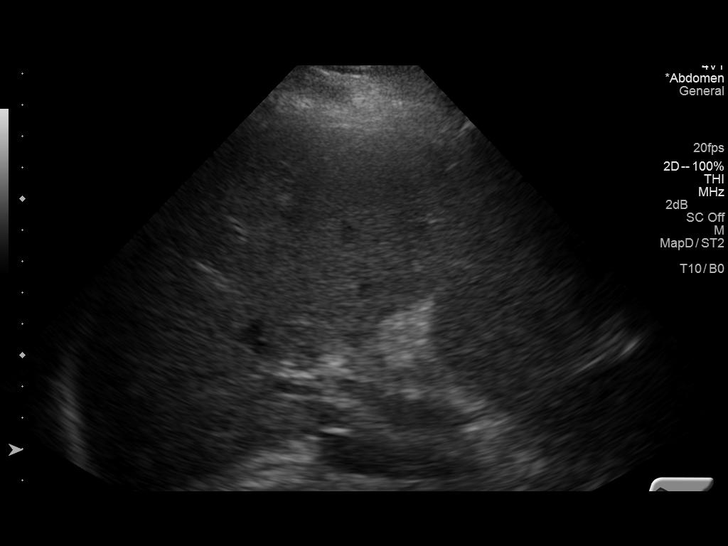
[im 58/77]
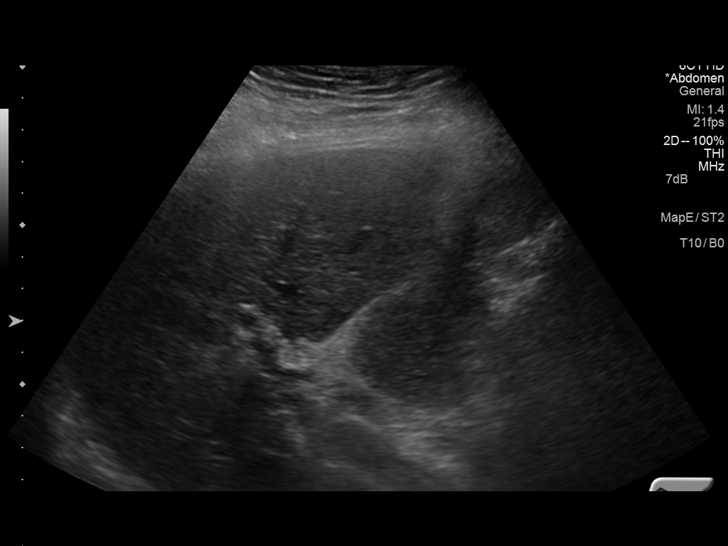
[im 64/77]
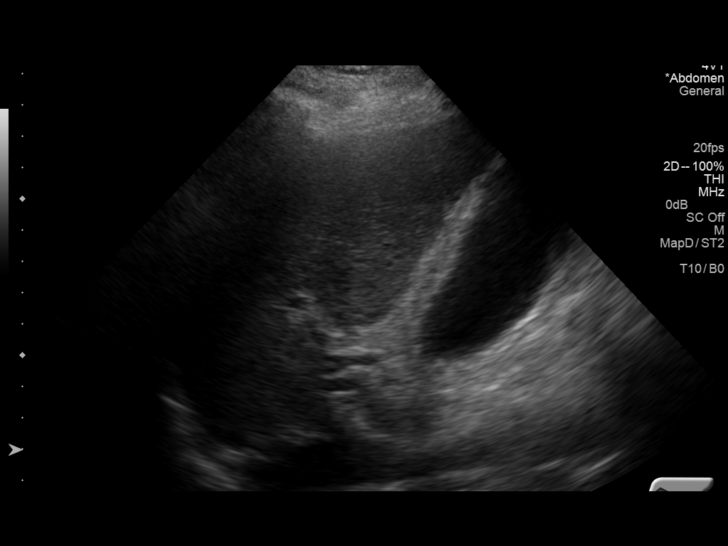
[im 70/77]
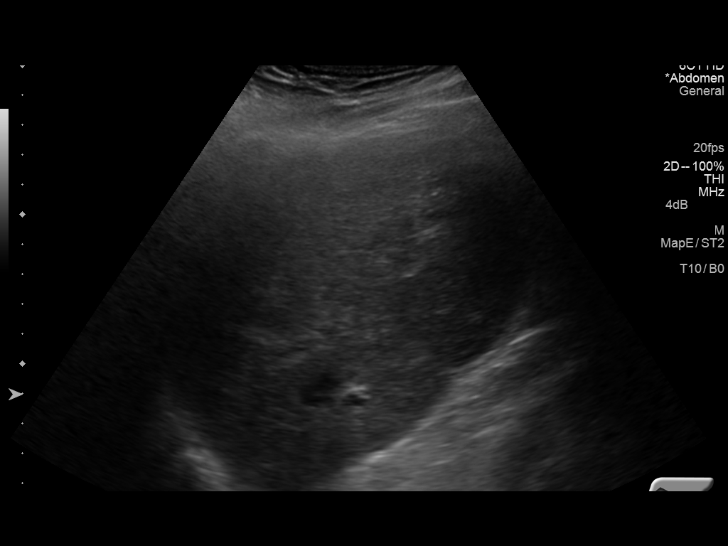
[im 77/77]
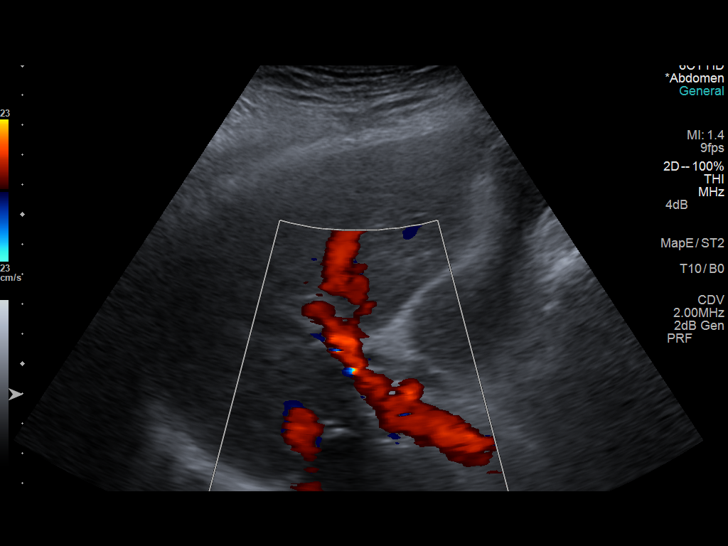

[14 of 25 positions shown; findings below may reference images not displayed]

FINDINGS: Gallbladder:

No definite shadowing gallstones are identified. Layering
gallbladder sludge. There is thickening of gallbladder wall up to 6
mm. No sonographic Murphy's sign.

Common bile duct:

Diameter: 8 mm in diameter mild prominent in size

Liver:

No focal lesion identified. There is mild heterogeneous echogenicity
without intrahepatic biliary ductal dilatation
IMPRESSION: 1. No shadowing gallstones are noted within gallbladder. Gallbladder
sludge is noted. Thickening of gallbladder wall up to 6 mm without
sonographic Murphy's sign.
2. Mild prominent size CBD measures 8 mm in diameter.
3. No intrahepatic biliary ductal dilatation. No focal hepatic mass.

## 2016-05-28 ENCOUNTER — Other Ambulatory Visit: Payer: Self-pay | Admitting: *Deleted

## 2016-05-28 NOTE — Patient Outreach (Signed)
Transition of care call attempt No answer, left VM with RNCM contact requesting call back. [name]. [name], RN, BSN, [place] [phone]

## 2016-05-31 NOTE — Progress Notes (Addendum)
Patient ID: [name], male   DOB: Dec 09, 1955, 60 y.o.   MRN: [id number]   ADVANCED HF CLINIC NOTE  PCP: Dr. Gerarda Fraction Oncologist: Dr Benay Spice.  CHF: Bensimhon  60 yo with history of CAD and ischemic cardiomyopathy EF 20-25%, BiV ICD upgrade 2009, chronic atrial fibrillation s/p AVN ablation 9/16, CKD, and traumatic SAH in 1/16 after a fall.  He has been followed at the heart failure clinic at [facility] by Dr Carolynn Serve in the past.    He was admitted in 3/16 from the [facility] ER with exertional dyspnea/volume overload and had a cardiac arrest/ventricular fibrillation while in the hospital terminated by his ICD.  After diuresis, RHC showed relatively normal filling pressures and preserved cardiac index.  Creatinine peaked at 3.0. (was 5.0 in 1/16).  Echo in 3/16 showed EF 25-30% with restrictive diastolic function.  Cardiolite was done, showing areas of scar but no ischemia.  EF 18%.    Admitted 4/16 with hematuria and elevated LFTs. Questionable amiodarone toxicity and this was stopped.   In 5/16 Underwent elective BiV ICD generator change which went without complication. He was admitted again 04/23/15 with acute on chronic CHF and respiratory failure. During that admission he ruled in for a NSTEMI- Troponin 4.29. He has chronic stage 4 CKD and it was decided to obtain a Myoview before considering a cath. Myoview 04/26/15 showed EF 20% with large region of prior infarct involving the apical anterior, anteroseptal, inferoseptal and inferior walls with extension to the true apex. Small region of reversible/inducible ischemia along the margin of the prior infarct at the mid ventricular anterior and anteroseptal walls. No change from previous.   Admitted to [facility] in 8/16 for increased dyspnea and volume overload. Diuresed with IV lasix and transitioned to 40 mg daily. D/C weight  200 pounds.  Redmitted in 9/16 with bleeding from stoma site and atrial fibrillation with RVR.  Eliquis was held and bleeding  stopped.  Eliquis was restarted prior to discharge. He was noted to have < 50% BiV pacing and also to be very volume overloaded. He was then diuresed and had AV nodal ablation.    Readmitted 10/16 with volume overload and worsening renal failure.. Diuresed 32 pounds. Went home 189 pounds. Switched to torsemide on d/c 80/40  Admitted 6/4 -05/12/16 with Abd/chest pain.  Cardiac work up negative. ABD [place] with Cholelithiasis without acute cholecystitis and cirrhosis without ascites. Pt declined HIDA scan. No surgery planned.  Plan on percutaneous drain placement as outpatient if patient were to become symptomatic.  Echo 05/11/16 with LVEF 10-15%, mild AI, mild MR, normal RV. Discharge weight 188 lbs.   He presents today for post hospital follow up.  At last visit was stable, no med changes. Admitted for abd pain/chest pain as above. Weight down 9 lbs from last visit. Weight at home 178.8. Appetite is great, but holding back due to recent abdominal pain. Still having a lot of abdominal pain and occasional N/V. Wants to hold off on drain until end of July. Breathing is good. Denies DOE on flat ground, stairs, or incline.  Denies any edema. Denies orthopnea or bendopnea.  He continues to have  lightheadedness with rapid standing up from bending over for prolonged periods. No bleeding on eliquis. Did have chills and N/V last night with abdominal pain.   Corevue: MOD not functioning properly, could not interrogate.    Labs (12/15): LDL 123 Labs (3/16): K 3.8, creatinine 2.78 Labs (03/10/2015): K 4.5 Creatinine 2.49  TSH  4.58, LFTs normal, digoxin 0.8 Labs (04/26/15): K 3.5 creatinine 2.13 Labs (08/04/2015): K 3.3 Creatinine 3.01  Labs (9/16): K 4.1, creatinine 2.7, HCT 30.8 Labs (4/17): K 3.5, creatinine 2.67 Labs (05/12/16): K 3.7, creatinine 2.66  PMH: 1. HTN 2. Type II diabetes 3. CAD: s/p BMS LAD in 2001, PTCA ramus and BMS LAD in 2009.  Cardiolite (3/16) with EF 18%, no ischemia, prior anterior, apical and  inferior infarction. NSTEMI in 5/16.  Myoview done 04/26/15 showed EF 20% with large region of prior infarct involving the apical anterior, anteroseptal, inferoseptal and inferior walls with extension to the true apex. Small region of reversible/inducible ischemia along the margin of the prior infarct at the mid ventricular anterior and anteroseptal walls. No change from previous.  4. Chronic systolic CHF: Ischemic CMP.  St Jude CRT-D.  Echo (3/16) with EF 25-30%, restrictive diastolic function, mild LVH, mild MR.  RHC (3/16) with mean RA 9, PA 47/29, mean PCWP 16, CI 2.5 (Fick).  Suspect ACEI cough. Echo (5/16) with EF 35%.   5. SAH: 1/16 after fall (traumatic).   6. GERD 7. Atrial fibrillation: Paroxysmal initially but now chronic.  Now s/p AV nodal ablation to allow BiV pacing. 8. Rectal cancer: s/p surgery. Has colostomy.  9. Prostate cancer: s/p chemo/radiation.  10. H/o TIA 11. OSA: On CPAP.  12. Hyperlipidemia 13. CKD stage IV: Followed by Dr Lowanda Foster.  14. Bleeding from stoma.   SH: Married, lives in [place], nonsmoker.   FH: CAD  ROS: All systems reviewed and negative except as per HPI.   Current Outpatient Prescriptions  Medication Sig Dispense Refill  . apixaban (ELIQUIS) 5 MG TABS tablet Take 1 tablet (5 mg total) by mouth 2 (two) times daily. 60 tablet 3  . atorvastatin (LIPITOR) 80 MG tablet Take 1 tablet (80 mg total) by mouth daily. 30 tablet 6  . co-enzyme Q-10 50 MG capsule Take 50 mg by mouth every morning.     . ferrous sulfate 325 (65 FE) MG tablet Take 1 tablet (325 mg total) by mouth 2 (two) times daily with a meal. 60 tablet 0  . insulin glargine (LANTUS) 100 UNIT/ML injection Inject 30 Units into the skin every morning.    . metoprolol succinate (TOPROL-XL) 25 MG 24 hr tablet Take 0.5 tablets (12.5 mg total) by mouth 2 (two) times daily. 45 tablet 3  . Omega-3 Fatty Acids (FISH OIL) 1000 MG CAPS Take 1,000 mg by mouth 2 (two) times daily.    . pantoprazole  (PROTONIX) 40 MG tablet Take 1 tablet (40 mg total) by mouth daily. 30 tablet 0  . potassium chloride SA (K-DUR,KLOR-CON) 20 MEQ tablet Take 20 mEq by mouth daily.    Marland Kitchen PROAIR RESPICLICK 123XX123 (90 BASE) MCG/ACT AEPB Inhale 2 puffs into the lungs every 6 (six) hours as needed (for breathing).   0  . torsemide (DEMADEX) 20 MG tablet Take 4 tablets by mouth every morning and 2 tables by mouth every evening 180 tablet 3  . metolazone (ZAROXOLYN) 2.5 MG tablet Take 2.5 mg by mouth as needed. Takes about once a  month  0  . nitroGLYCERIN (NITROLINGUAL) 0.4 MG/SPRAY spray Place 1 spray under the tongue every 5 (five) minutes x 3 doses as needed for chest pain. Reported on 06/01/2016    . traMADol (ULTRAM) 50 MG tablet Take 1 tablet (50 mg total) by mouth every 6 (six) hours as needed. (Patient not taking: Reported on 06/01/2016) 30 tablet 1   No  current facility-administered medications for this encounter.    Filed Vitals:   06/01/16 1131  BP: 92/64  Pulse: 89  Weight: 184 lb 9.6 oz (83.734 kg)  SpO2: 100%    Wt Readings from Last 3 Encounters:  06/01/16 184 lb 9.6 oz (83.734 kg)  05/12/16 188 lb 8 oz (85.503 kg)  05/07/16 193 lb 12.8 oz (87.907 kg)    General: NAD Ambulated in the clinic without difficutly.  Neck: JVP 6-7 cm.   no thyromegaly or thyroid nodule.  Lungs: CTAB, normal effort.  CV: Nondisplaced PMI.  Regular S1S2, 2/6 SEM RUSB.  No peripheral edema.  No carotid bruit.  Normal pedal pulses.  Abdomen: Soft, mildly tender, ND, no HSM appreciated. S/p colostomy.  Skin: Intact without lesions or rashes.  Neurologic: Alert and oriented x 3.  Psych: Normal affect. Extremities: No clubbing or cyanosis.  HEENT: Normal.   Assessment/Plan: 1. Chronic systolic CHF: Ischemic cardiomyopathy. Echo 05/11/16 with LVEF 10-15%, mild AI, mild MR, normal RV.  Has been followed for a long time at [facility].  Deemed not transplant or VAD candidate with comorbities and advanced CKD. Has St Jude CRT-D, now  s/p AV nodal ablation to promote BiV pacing.  Breathing much better since AV nodal ablation (NYHA class II) - Volume status stable. Eating much better in attempts to avoid aggravating his gallbladder pain.  - Continue Toprol 25 bid.  - No ACEI, spironolactone, digoxin with advanced CKD.  Will check BMET  - Has not been checking pressure at home. Remains soft here.  Will keep off hydral/NTG for now.   - Given CKD and previous cancer not good VAD or transplant candidate (would need heart/kidney)  2. Atrial fibrillation: Chronic. CHADS2Vasc Score 5. s/p AV nodal ablation - Feels great s/p AV node ablation.  - Did have one episode of stoma bleeding back on Eliquis 5 bid. But none since.  3. CAD:  NSTEMI in 5/16.  - Cardiolite without high ischemic burden. EF is decreased from previous, but poor cath candidate with renal failure. - No ASA given Eliquis use.   - Cont statin and Toprol XL.  4. CKD: Stage IV. S/p AVF in RUE.  - BMET today.  - Followed by Dr Lowanda Foster in [place].  5.  DM2: followed by Dr. Gerarda Fraction 7. H/O rectal cancer 2008: Has diverting colostomy.  He has follow up with Dr Benay Spice. - Recent CT (05/02/16) with no evidence of recurrence. 8. Hyperlipidemia: - Continue statin  9. Chronic Cholecystitis  - Avoiding foods that seem to make him have worse pain. Having flare ups every 1-2 weeks.  Had chills and N/V yesterday.  -  Will check CBC today, if elevated will have him contact surgeons to move up time table.  Currently he wants to wait until end of July to get drain placed if needed. Encouraged patient to call General surgery (Dr Dalbert Batman is his surgeon) with any worsening symptoms.   Stable from HF standpoint. BMET and CBC today. Will follow up in 6 weeks. Knows to call sooner with symptoms.    Shirley Friar, PA-C  06/01/2016

## 2016-06-01 ENCOUNTER — Encounter: Payer: Self-pay | Admitting: Internal Medicine

## 2016-06-01 ENCOUNTER — Ambulatory Visit (HOSPITAL_COMMUNITY)
Admission: RE | Admit: 2016-06-01 | Discharge: 2016-06-01 | Disposition: A | Discharge status: Home / Self Care | Payer: Medicare Other | Source: Ambulatory Visit | Attending: Cardiology | Admitting: Cardiology

## 2016-06-01 ENCOUNTER — Encounter: Payer: Self-pay | Admitting: *Deleted

## 2016-06-01 ENCOUNTER — Other Ambulatory Visit: Payer: Self-pay | Admitting: *Deleted

## 2016-06-01 ENCOUNTER — Ambulatory Visit: Payer: Self-pay | Admitting: *Deleted

## 2016-06-01 VITALS — BP 92/64 | HR 89 | Wt 184.6 lb

## 2016-06-01 DIAGNOSIS — R112 Nausea with vomiting, unspecified: Secondary | ICD-10-CM | POA: Insufficient documentation

## 2016-06-01 DIAGNOSIS — I5022 Chronic systolic (congestive) heart failure: Secondary | ICD-10-CM | POA: Insufficient documentation

## 2016-06-01 DIAGNOSIS — I255 Ischemic cardiomyopathy: Secondary | ICD-10-CM | POA: Diagnosis not present

## 2016-06-01 DIAGNOSIS — Z955 Presence of coronary angioplasty implant and graft: Secondary | ICD-10-CM | POA: Insufficient documentation

## 2016-06-01 DIAGNOSIS — Z8679 Personal history of other diseases of the circulatory system: Secondary | ICD-10-CM | POA: Diagnosis not present

## 2016-06-01 DIAGNOSIS — Z8249 Family history of ischemic heart disease and other diseases of the circulatory system: Secondary | ICD-10-CM | POA: Insufficient documentation

## 2016-06-01 DIAGNOSIS — E1122 Type 2 diabetes mellitus with diabetic chronic kidney disease: Secondary | ICD-10-CM | POA: Diagnosis not present

## 2016-06-01 DIAGNOSIS — Z8673 Personal history of transient ischemic attack (TIA), and cerebral infarction without residual deficits: Secondary | ICD-10-CM | POA: Diagnosis not present

## 2016-06-01 DIAGNOSIS — Z8546 Personal history of malignant neoplasm of prostate: Secondary | ICD-10-CM | POA: Diagnosis not present

## 2016-06-01 DIAGNOSIS — Z794 Long term (current) use of insulin: Secondary | ICD-10-CM | POA: Insufficient documentation

## 2016-06-01 DIAGNOSIS — I252 Old myocardial infarction: Secondary | ICD-10-CM | POA: Diagnosis not present

## 2016-06-01 DIAGNOSIS — Z933 Colostomy status: Secondary | ICD-10-CM | POA: Insufficient documentation

## 2016-06-01 DIAGNOSIS — Z7901 Long term (current) use of anticoagulants: Secondary | ICD-10-CM | POA: Insufficient documentation

## 2016-06-01 DIAGNOSIS — I2510 Atherosclerotic heart disease of native coronary artery without angina pectoris: Secondary | ICD-10-CM | POA: Insufficient documentation

## 2016-06-01 DIAGNOSIS — G4733 Obstructive sleep apnea (adult) (pediatric): Secondary | ICD-10-CM | POA: Insufficient documentation

## 2016-06-01 DIAGNOSIS — Z9889 Other specified postprocedural states: Secondary | ICD-10-CM | POA: Insufficient documentation

## 2016-06-01 DIAGNOSIS — N184 Chronic kidney disease, stage 4 (severe): Secondary | ICD-10-CM | POA: Diagnosis not present

## 2016-06-01 DIAGNOSIS — I130 Hypertensive heart and chronic kidney disease with heart failure and stage 1 through stage 4 chronic kidney disease, or unspecified chronic kidney disease: Secondary | ICD-10-CM | POA: Diagnosis not present

## 2016-06-01 DIAGNOSIS — E785 Hyperlipidemia, unspecified: Secondary | ICD-10-CM | POA: Diagnosis not present

## 2016-06-01 DIAGNOSIS — I482 Chronic atrial fibrillation, unspecified: Secondary | ICD-10-CM

## 2016-06-01 DIAGNOSIS — K219 Gastro-esophageal reflux disease without esophagitis: Secondary | ICD-10-CM | POA: Insufficient documentation

## 2016-06-01 DIAGNOSIS — K811 Chronic cholecystitis: Secondary | ICD-10-CM | POA: Diagnosis not present

## 2016-06-01 DIAGNOSIS — I480 Paroxysmal atrial fibrillation: Secondary | ICD-10-CM | POA: Diagnosis not present

## 2016-06-01 DIAGNOSIS — Z85048 Personal history of other malignant neoplasm of rectum, rectosigmoid junction, and anus: Secondary | ICD-10-CM | POA: Diagnosis not present

## 2016-06-01 LAB — BASIC METABOLIC PANEL
ANION GAP: 12 (ref 5–15)
BUN: 36 mg/dL — ABNORMAL HIGH (ref 6–20)
CHLORIDE: 96 mmol/L — AB (ref 101–111)
CO2: 26 mmol/L (ref 22–32)
CREATININE: 2.74 mg/dL — AB (ref 0.61–1.24)
Calcium: 10.3 mg/dL (ref 8.9–10.3)
GFR calc non Af Amer: 23 mL/min — ABNORMAL LOW (ref >60)
GFR, EST AFRICAN AMERICAN: 27 mL/min — AB (ref >60)
Glucose, Bld: 391 mg/dL — ABNORMAL HIGH (ref 65–99)
POTASSIUM: 4.8 mmol/L (ref 3.5–5.1)
Sodium: 134 mmol/L — ABNORMAL LOW (ref 135–145)

## 2016-06-01 LAB — BRAIN NATRIURETIC PEPTIDE: B NATRIURETIC PEPTIDE 5: 1016.7 pg/mL — AB (ref 0.0–100.0)

## 2016-06-01 NOTE — Patient Instructions (Signed)
Routine lab work today. Will notify you of abnormal results  Follow up with Dr.Bensimhon in 6 weeks

## 2016-06-01 NOTE — Patient Outreach (Signed)
Transition of care call  Spoke with patient wife, [name].  Patient doing ok, went to HF clinic today, reporting that HF is stable at this time. They checked labs for infection due to continued gall bladder symptoms of nausea and pain. Patient is trying to watch his diet but did eat a meatball sub yesterday. Patient wife reports that patient is not candidate for surgery, may be able to put in a drain. They want to wait until end of July due to upcoming vacation. HF clinic will refer to surgeon for possible drain, if indicated by labs.  Patient is weighing daily and is independent with ADLs and IADLs He is managing his own medications mostly wife can assist if needed. Wife did have car accident in December and has had extensive recovery of her own, so patient has been independent and also partial caregiver to wife.  Medications Reviewed Today    Reviewed by Nicholaus Bloom, RN (Registered Nurse) on 06/01/16 at 1349  Med List Status: <None>   Medication Order Taking? Sig Documenting Provider Last Dose Status Informant   apixaban (ELIQUIS) 5 MG TABS tablet [id number] Yes Take 1 tablet (5 mg total) by mouth 2 (two) times daily. Jolaine Artist, MD Taking Active Self   atorvastatin (LIPITOR) 80 MG tablet [id number] Yes Take 1 tablet (80 mg total) by mouth daily. Larey Dresser, MD Taking Active Self   co-enzyme Q-10 50 MG capsule [id number] Yes Take 50 mg by mouth every morning.  Historical Provider, MD Taking Active Self   ferrous sulfate 325 (65 FE) MG tablet [id number] Yes Take 1 tablet (325 mg total) by mouth 2 (two) times daily with a meal. Jonetta Osgood, MD Taking Active Self   insulin glargine (LANTUS) 100 UNIT/ML injection [id number] Yes Inject 30 Units into the skin every morning. Historical Provider, MD Taking Active Self   metolazone (ZAROXOLYN) 2.5 MG tablet [id number] Yes Take 2.5 mg by mouth as needed. Takes about once a  month Historical Provider, MD Taking Active Self             Med  Note Lennox Grumbles, [facility]   Tue Jun 01, 2016 11:45 AM): Received from: External Pharmacy Received Sig:     metoprolol succinate (TOPROL-XL) 25 MG 24 hr tablet [id number] Yes Take 0.5 tablets (12.5 mg total) by mouth 2 (two) times daily. Jolaine Artist, MD Taking Active Self   nitroGLYCERIN (NITROLINGUAL) 0.4 MG/SPRAY spray [id number] Yes Place 1 spray under the tongue every 5 (five) minutes x 3 doses as needed for chest pain. Reported on 06/01/2016 Historical Provider, MD Taking Active Self   Omega-3 Fatty Acids (FISH OIL) 1000 MG CAPS [id number] Yes Take 1,000 mg by mouth 2 (two) times daily. Historical Provider, MD Taking Active Self   pantoprazole (PROTONIX) 40 MG tablet [id number] Yes Take 1 tablet (40 mg total) by mouth daily. Shirley Friar, PA-C Taking Active Self   potassium chloride SA (K-DUR,KLOR-CON) 20 MEQ tablet [id number] Yes Take 20 mEq by mouth daily. Historical Provider, MD Taking Active Self   PROAIR RESPICLICK [id number] (90 BASE) MCG/ACT AEPB [id number] Yes Inhale 2 puffs into the lungs every 6 (six) hours as needed (for breathing).  Historical Provider, MD Taking Active Self             Med Note Inocente Salles, RACHEL A   Sat Aug 23, 2015  2:27 AM):       torsemide (DEMADEX) 20 MG tablet [id number] Yes Take 4 tablets by mouth every  morning and 2 tables by mouth every evening Amy Estrella Deeds, NP Taking Active Self   traMADol (ULTRAM) 50 MG tablet [id number] Yes Take 1 tablet (50 mg total) by mouth every 6 (six) hours as needed. Reyne Dumas, MD Taking Active Self         Plan  [facility] CM Care Plan Problem One        Most Recent Value   Care Plan Problem One  High risk for readmission as evidenced by recent back to back admissions   Role Documenting the Problem One  Care Management [place] for Problem One  Active   [facility] Long Term Goal (31-90 days)  Patient will not have any admissions for HF in the next 60 days   [facility] Long Term Goal Start Date  05/18/16   Interventions for Problem  One Long Term Goal  Using teachback,  reviewed symptoms to report to MD    [facility] CM Short Term Goal #1 (0-30 days)  Patient will not have a hospital admission over the next 30 days   [facility] CM Short Term Goal #1 Start Date  05/18/16   Interventions for Short Term Goal #1  Reviewed weights,  weights stable, has followed up with HF clinic     Continue transition of care program calls Will not plan for home visit as they do not feel it is needed at this time, but agree to weekly call. [name]. [name], RN, BSN, [place] [phone]

## 2016-06-01 NOTE — Progress Notes (Signed)
Advanced Heart Failure Medication Review by a Pharmacist  Does the patient  feel that his/her medications are working for him/her?  yes  Has the patient been experiencing any side effects to the medications prescribed?  no  Does the patient measure his/her own blood pressure or blood glucose at home?  no   Does the patient have any problems obtaining medications due to transportation or finances?   no  Understanding of regimen: good Understanding of indications: good Potential of compliance: good Patient understands to avoid NSAIDs. Patient understands to avoid decongestants.  Pharmacist comments: [name] is a pleasant 52 presenting to clinic for a hospital follow-up appointment.  He denies any medication-related side effects and also denies any SOB or swelling. He does have some dizziness if he stays bent over for an extended period of time. He has a great understanding of his medication regimen and heart failure. He did not have any other medication-related questions or concerns at this time.   Patient was recently discharged from hospital and all medications have been reviewed.  Lisabeth Mian C. Lennox Grumbles, PharmD Pharmacy Resident  Pager: [phone] 06/01/2016 11:55 AM   Time with patient: 15 min  Preparation and documentation time: 3 min  Total time: 18 min

## 2016-06-05 HISTORY — PX: ERCP: SHX60

## 2016-06-09 ENCOUNTER — Emergency Department (HOSPITAL_COMMUNITY): Payer: Medicare Other

## 2016-06-09 ENCOUNTER — Inpatient Hospital Stay (HOSPITAL_COMMUNITY)
Admission: EM | Admit: 2016-06-09 | Discharge: 2016-06-21 | DRG: 871 | Disposition: A | Discharge status: Home / Self Care | Payer: Medicare Other | Attending: Internal Medicine | Admitting: Internal Medicine

## 2016-06-09 ENCOUNTER — Encounter (HOSPITAL_COMMUNITY): Payer: Self-pay | Admitting: Emergency Medicine

## 2016-06-09 ENCOUNTER — Observation Stay (HOSPITAL_COMMUNITY): Payer: Medicare Other

## 2016-06-09 ENCOUNTER — Ambulatory Visit: Payer: Self-pay | Admitting: *Deleted

## 2016-06-09 DIAGNOSIS — K219 Gastro-esophageal reflux disease without esophagitis: Secondary | ICD-10-CM | POA: Diagnosis present

## 2016-06-09 DIAGNOSIS — K8020 Calculus of gallbladder without cholecystitis without obstruction: Secondary | ICD-10-CM | POA: Diagnosis not present

## 2016-06-09 DIAGNOSIS — Z01810 Encounter for preprocedural cardiovascular examination: Secondary | ICD-10-CM | POA: Diagnosis not present

## 2016-06-09 DIAGNOSIS — I5022 Chronic systolic (congestive) heart failure: Secondary | ICD-10-CM

## 2016-06-09 DIAGNOSIS — W57XXXA Bitten or stung by nonvenomous insect and other nonvenomous arthropods, initial encounter: Secondary | ICD-10-CM | POA: Diagnosis present

## 2016-06-09 DIAGNOSIS — K316 Fistula of stomach and duodenum: Secondary | ICD-10-CM | POA: Diagnosis not present

## 2016-06-09 DIAGNOSIS — J449 Chronic obstructive pulmonary disease, unspecified: Secondary | ICD-10-CM | POA: Diagnosis present

## 2016-06-09 DIAGNOSIS — Z794 Long term (current) use of insulin: Secondary | ICD-10-CM

## 2016-06-09 DIAGNOSIS — I482 Chronic atrial fibrillation, unspecified: Secondary | ICD-10-CM | POA: Diagnosis present

## 2016-06-09 DIAGNOSIS — A419 Sepsis, unspecified organism: Principal | ICD-10-CM | POA: Diagnosis present

## 2016-06-09 DIAGNOSIS — Z9049 Acquired absence of other specified parts of digestive tract: Secondary | ICD-10-CM

## 2016-06-09 DIAGNOSIS — I5043 Acute on chronic combined systolic (congestive) and diastolic (congestive) heart failure: Secondary | ICD-10-CM | POA: Diagnosis present

## 2016-06-09 DIAGNOSIS — R1011 Right upper quadrant pain: Secondary | ICD-10-CM

## 2016-06-09 DIAGNOSIS — Z7901 Long term (current) use of anticoagulants: Secondary | ICD-10-CM

## 2016-06-09 DIAGNOSIS — E871 Hypo-osmolality and hyponatremia: Secondary | ICD-10-CM | POA: Diagnosis present

## 2016-06-09 DIAGNOSIS — K838 Other specified diseases of biliary tract: Secondary | ICD-10-CM | POA: Diagnosis not present

## 2016-06-09 DIAGNOSIS — N170 Acute kidney failure with tubular necrosis: Secondary | ICD-10-CM | POA: Diagnosis present

## 2016-06-09 DIAGNOSIS — I50810 Right heart failure, unspecified: Secondary | ICD-10-CM

## 2016-06-09 DIAGNOSIS — Z9989 Dependence on other enabling machines and devices: Secondary | ICD-10-CM

## 2016-06-09 DIAGNOSIS — E1129 Type 2 diabetes mellitus with other diabetic kidney complication: Secondary | ICD-10-CM | POA: Diagnosis present

## 2016-06-09 DIAGNOSIS — Z933 Colostomy status: Secondary | ICD-10-CM

## 2016-06-09 DIAGNOSIS — R079 Chest pain, unspecified: Secondary | ICD-10-CM | POA: Diagnosis not present

## 2016-06-09 DIAGNOSIS — Z85048 Personal history of other malignant neoplasm of rectum, rectosigmoid junction, and anus: Secondary | ICD-10-CM | POA: Diagnosis present

## 2016-06-09 DIAGNOSIS — I480 Paroxysmal atrial fibrillation: Secondary | ICD-10-CM | POA: Diagnosis present

## 2016-06-09 DIAGNOSIS — R740 Nonspecific elevation of levels of transaminase and lactic acid dehydrogenase [LDH]: Secondary | ICD-10-CM | POA: Diagnosis not present

## 2016-06-09 DIAGNOSIS — IMO0002 Reserved for concepts with insufficient information to code with codable children: Secondary | ICD-10-CM | POA: Diagnosis present

## 2016-06-09 DIAGNOSIS — E1122 Type 2 diabetes mellitus with diabetic chronic kidney disease: Secondary | ICD-10-CM | POA: Diagnosis present

## 2016-06-09 DIAGNOSIS — E785 Hyperlipidemia, unspecified: Secondary | ICD-10-CM | POA: Diagnosis present

## 2016-06-09 DIAGNOSIS — I2510 Atherosclerotic heart disease of native coronary artery without angina pectoris: Secondary | ICD-10-CM

## 2016-06-09 DIAGNOSIS — D649 Anemia, unspecified: Secondary | ICD-10-CM | POA: Diagnosis present

## 2016-06-09 DIAGNOSIS — N184 Chronic kidney disease, stage 4 (severe): Secondary | ICD-10-CM | POA: Diagnosis present

## 2016-06-09 DIAGNOSIS — R0602 Shortness of breath: Secondary | ICD-10-CM | POA: Diagnosis not present

## 2016-06-09 DIAGNOSIS — L03115 Cellulitis of right lower limb: Secondary | ICD-10-CM | POA: Diagnosis present

## 2016-06-09 DIAGNOSIS — K851 Biliary acute pancreatitis without necrosis or infection: Secondary | ICD-10-CM | POA: Diagnosis present

## 2016-06-09 DIAGNOSIS — D638 Anemia in other chronic diseases classified elsewhere: Secondary | ICD-10-CM | POA: Diagnosis present

## 2016-06-09 DIAGNOSIS — K811 Chronic cholecystitis: Secondary | ICD-10-CM | POA: Diagnosis present

## 2016-06-09 DIAGNOSIS — R7401 Elevation of levels of liver transaminase levels: Secondary | ICD-10-CM | POA: Diagnosis present

## 2016-06-09 DIAGNOSIS — K8050 Calculus of bile duct without cholangitis or cholecystitis without obstruction: Secondary | ICD-10-CM | POA: Diagnosis present

## 2016-06-09 DIAGNOSIS — K824 Cholesterolosis of gallbladder: Secondary | ICD-10-CM | POA: Diagnosis present

## 2016-06-09 DIAGNOSIS — K831 Obstruction of bile duct: Secondary | ICD-10-CM | POA: Diagnosis present

## 2016-06-09 DIAGNOSIS — K819 Cholecystitis, unspecified: Secondary | ICD-10-CM

## 2016-06-09 DIAGNOSIS — Z95810 Presence of automatic (implantable) cardiac defibrillator: Secondary | ICD-10-CM

## 2016-06-09 DIAGNOSIS — I255 Ischemic cardiomyopathy: Secondary | ICD-10-CM | POA: Diagnosis present

## 2016-06-09 DIAGNOSIS — Z8673 Personal history of transient ischemic attack (TIA), and cerebral infarction without residual deficits: Secondary | ICD-10-CM

## 2016-06-09 DIAGNOSIS — I951 Orthostatic hypotension: Secondary | ICD-10-CM | POA: Diagnosis present

## 2016-06-09 DIAGNOSIS — D696 Thrombocytopenia, unspecified: Secondary | ICD-10-CM | POA: Diagnosis present

## 2016-06-09 DIAGNOSIS — I252 Old myocardial infarction: Secondary | ICD-10-CM

## 2016-06-09 DIAGNOSIS — M7989 Other specified soft tissue disorders: Secondary | ICD-10-CM | POA: Diagnosis present

## 2016-06-09 DIAGNOSIS — Z8546 Personal history of malignant neoplasm of prostate: Secondary | ICD-10-CM

## 2016-06-09 DIAGNOSIS — R1012 Left upper quadrant pain: Secondary | ICD-10-CM | POA: Diagnosis not present

## 2016-06-09 DIAGNOSIS — I5023 Acute on chronic systolic (congestive) heart failure: Secondary | ICD-10-CM | POA: Diagnosis present

## 2016-06-09 DIAGNOSIS — E669 Obesity, unspecified: Secondary | ICD-10-CM | POA: Diagnosis present

## 2016-06-09 DIAGNOSIS — E1165 Type 2 diabetes mellitus with hyperglycemia: Secondary | ICD-10-CM | POA: Diagnosis present

## 2016-06-09 DIAGNOSIS — E872 Acidosis: Secondary | ICD-10-CM | POA: Diagnosis not present

## 2016-06-09 DIAGNOSIS — N179 Acute kidney failure, unspecified: Secondary | ICD-10-CM | POA: Diagnosis present

## 2016-06-09 DIAGNOSIS — G4733 Obstructive sleep apnea (adult) (pediatric): Secondary | ICD-10-CM | POA: Diagnosis present

## 2016-06-09 DIAGNOSIS — K8011 Calculus of gallbladder with chronic cholecystitis with obstruction: Secondary | ICD-10-CM | POA: Diagnosis present

## 2016-06-09 DIAGNOSIS — Z79899 Other long term (current) drug therapy: Secondary | ICD-10-CM

## 2016-06-09 DIAGNOSIS — I130 Hypertensive heart and chronic kidney disease with heart failure and stage 1 through stage 4 chronic kidney disease, or unspecified chronic kidney disease: Secondary | ICD-10-CM | POA: Diagnosis present

## 2016-06-09 DIAGNOSIS — Z992 Dependence on renal dialysis: Secondary | ICD-10-CM

## 2016-06-09 HISTORY — DX: Calculus of gallbladder without cholecystitis without obstruction: K80.20

## 2016-06-09 LAB — TROPONIN I
TROPONIN I: 0.04 ng/mL — AB (ref <0.03)
TROPONIN I: 0.05 ng/mL — AB (ref <0.03)
Troponin I: 0.04 ng/mL (ref <0.03)

## 2016-06-09 LAB — URINE MICROSCOPIC-ADD ON

## 2016-06-09 LAB — CBC
HCT: 45.4 % (ref 39.0–52.0)
Hemoglobin: 15.4 g/dL (ref 13.0–17.0)
MCH: 32.5 pg (ref 26.0–34.0)
MCHC: 33.9 g/dL (ref 30.0–36.0)
MCV: 95.8 fL (ref 78.0–100.0)
PLATELETS: 206 10*3/uL (ref 150–400)
RBC: 4.74 MIL/uL (ref 4.22–5.81)
RDW: 12.4 % (ref 11.5–15.5)
WBC: 12.1 10*3/uL — AB (ref 4.0–10.5)

## 2016-06-09 LAB — BASIC METABOLIC PANEL
Anion gap: 15 (ref 5–15)
BUN: 41 mg/dL — ABNORMAL HIGH (ref 6–20)
CALCIUM: 10.6 mg/dL — AB (ref 8.9–10.3)
CO2: 26 mmol/L (ref 22–32)
CREATININE: 2.7 mg/dL — AB (ref 0.61–1.24)
Chloride: 90 mmol/L — ABNORMAL LOW (ref 101–111)
GFR calc non Af Amer: 24 mL/min — ABNORMAL LOW (ref >60)
GFR, EST AFRICAN AMERICAN: 28 mL/min — AB (ref >60)
Glucose, Bld: 445 mg/dL — ABNORMAL HIGH (ref 65–99)
Potassium: 3.8 mmol/L (ref 3.5–5.1)
SODIUM: 131 mmol/L — AB (ref 135–145)

## 2016-06-09 LAB — URINALYSIS, ROUTINE W REFLEX MICROSCOPIC
BILIRUBIN URINE: NEGATIVE
Glucose, UA: >1000 mg/dL — AB
Hgb urine dipstick: NEGATIVE
KETONES UR: NEGATIVE mg/dL
Leukocytes, UA: NEGATIVE
NITRITE: NEGATIVE
Protein, ur: NEGATIVE mg/dL
Specific Gravity, Urine: 1.017 (ref 1.005–1.030)
pH: 5.5 (ref 5.0–8.0)

## 2016-06-09 LAB — GLUCOSE, CAPILLARY
GLUCOSE-CAPILLARY: 385 mg/dL — AB (ref 65–99)
Glucose-Capillary: 280 mg/dL — ABNORMAL HIGH (ref 65–99)
Glucose-Capillary: 216 mg/dL — ABNORMAL HIGH (ref 65–99)

## 2016-06-09 LAB — LIPASE, BLOOD: LIPASE: 78 U/L — AB (ref 11–51)

## 2016-06-09 LAB — PROTIME-INR
INR: 1.27 (ref 0.00–1.49)
Prothrombin Time: 16.1 seconds — ABNORMAL HIGH (ref 11.6–15.2)

## 2016-06-09 LAB — HEPATIC FUNCTION PANEL
ALBUMIN: 3.5 g/dL (ref 3.5–5.0)
ALT: 177 U/L — ABNORMAL HIGH (ref 17–63)
AST: 354 U/L — ABNORMAL HIGH (ref 15–41)
Alkaline Phosphatase: 456 U/L — ABNORMAL HIGH (ref 38–126)
Bilirubin, Direct: 1.9 mg/dL — ABNORMAL HIGH (ref 0.1–0.5)
Indirect Bilirubin: 1.3 mg/dL — ABNORMAL HIGH (ref 0.3–0.9)
TOTAL PROTEIN: 10.2 g/dL — AB (ref 6.5–8.1)
Total Bilirubin: 3.2 mg/dL — ABNORMAL HIGH (ref 0.3–1.2)

## 2016-06-09 LAB — I-STAT TROPONIN, ED: TROPONIN I, POC: 0.01 ng/mL (ref 0.00–0.08)

## 2016-06-09 MED ORDER — TORSEMIDE 20 MG PO TABS
40.0000 mg | ORAL_TABLET | Freq: Every evening | ORAL | Status: DC
  Administered 2016-06-09 – 2016-06-10 (×2): 40 mg via ORAL
  Filled 2016-06-09 (×2): qty 2

## 2016-06-09 MED ORDER — FERROUS SULFATE 325 (65 FE) MG PO TABS
325.0000 mg | ORAL_TABLET | Freq: Two times a day (BID) | ORAL | Status: DC
  Administered 2016-06-09 – 2016-06-21 (×21): 325 mg via ORAL
  Filled 2016-06-09 (×22): qty 1

## 2016-06-09 MED ORDER — PIPERACILLIN-TAZOBACTAM 3.375 G IVPB 30 MIN
3.3750 g | Freq: Once | INTRAVENOUS | Status: AC
  Administered 2016-06-09: 3.375 g via INTRAVENOUS
  Filled 2016-06-09: qty 50

## 2016-06-09 MED ORDER — PIPERACILLIN-TAZOBACTAM 3.375 G IVPB
3.3750 g | Freq: Three times a day (TID) | INTRAVENOUS | Status: DC
  Administered 2016-06-09 – 2016-06-13 (×12): 3.375 g via INTRAVENOUS
  Filled 2016-06-09 (×14): qty 50

## 2016-06-09 MED ORDER — MORPHINE SULFATE (PF) 4 MG/ML IV SOLN
4.0000 mg | INTRAVENOUS | Status: DC | PRN
  Administered 2016-06-09 – 2016-06-10 (×2): 4 mg via INTRAVENOUS
  Filled 2016-06-09 (×2): qty 1

## 2016-06-09 MED ORDER — ONDANSETRON HCL 4 MG PO TABS: 4.0000 mg | ORAL_TABLET | Freq: Four times a day (QID) | ORAL | Status: DC | PRN

## 2016-06-09 MED ORDER — PANTOPRAZOLE SODIUM 40 MG PO TBEC
40.0000 mg | DELAYED_RELEASE_TABLET | Freq: Every day | ORAL | Status: DC
  Administered 2016-06-09 – 2016-06-21 (×13): 40 mg via ORAL
  Filled 2016-06-09 (×13): qty 1

## 2016-06-09 MED ORDER — MAGNESIUM CITRATE PO SOLN: 1.0000 | Freq: Once | ORAL | Status: DC | PRN

## 2016-06-09 MED ORDER — SODIUM CHLORIDE 0.9% FLUSH
3.0000 mL | Freq: Two times a day (BID) | INTRAVENOUS | Status: DC
  Administered 2016-06-09 – 2016-06-20 (×13): 3 mL via INTRAVENOUS

## 2016-06-09 MED ORDER — MORPHINE SULFATE (PF) 2 MG/ML IV SOLN: 1.0000 mg | INTRAVENOUS | Status: DC | PRN

## 2016-06-09 MED ORDER — SENNOSIDES-DOCUSATE SODIUM 8.6-50 MG PO TABS: 1.0000 | ORAL_TABLET | Freq: Every evening | ORAL | Status: DC | PRN

## 2016-06-09 MED ORDER — INSULIN GLARGINE 100 UNIT/ML ~~LOC~~ SOLN
15.0000 [IU] | Freq: Two times a day (BID) | SUBCUTANEOUS | Status: DC
Start: 2016-06-09 — End: 2016-06-11
  Administered 2016-06-09 – 2016-06-10 (×4): 15 [IU] via SUBCUTANEOUS
  Filled 2016-06-09 (×6): qty 0.15

## 2016-06-09 MED ORDER — TORSEMIDE 20 MG PO TABS
80.0000 mg | ORAL_TABLET | Freq: Every morning | ORAL | Status: DC
  Administered 2016-06-10: 80 mg via ORAL
  Filled 2016-06-09 (×2): qty 4

## 2016-06-09 MED ORDER — TECHNETIUM TC 99M MEBROFENIN IV KIT
5.0000 | PACK | Freq: Once | INTRAVENOUS | Status: AC | PRN
  Administered 2016-06-09: 5 via INTRAVENOUS

## 2016-06-09 MED ORDER — INSULIN ASPART 100 UNIT/ML ~~LOC~~ SOLN
0.0000 [IU] | Freq: Three times a day (TID) | SUBCUTANEOUS | Status: DC
  Administered 2016-06-09: 15 [IU] via SUBCUTANEOUS
  Administered 2016-06-09: 8 [IU] via SUBCUTANEOUS
  Administered 2016-06-10 (×2): 3 [IU] via SUBCUTANEOUS
  Administered 2016-06-10: 2 [IU] via SUBCUTANEOUS
  Administered 2016-06-11: 5 [IU] via SUBCUTANEOUS
  Administered 2016-06-12 (×2): 11 [IU] via SUBCUTANEOUS
  Administered 2016-06-12 – 2016-06-13 (×2): 15 [IU] via SUBCUTANEOUS
  Administered 2016-06-13 (×2): 11 [IU] via SUBCUTANEOUS
  Administered 2016-06-14: 3 [IU] via SUBCUTANEOUS
  Administered 2016-06-14: 5 [IU] via SUBCUTANEOUS
  Administered 2016-06-14: 3 [IU] via SUBCUTANEOUS
  Administered 2016-06-15: 2 [IU] via SUBCUTANEOUS
  Administered 2016-06-15 (×2): 3 [IU] via SUBCUTANEOUS

## 2016-06-09 MED ORDER — TORSEMIDE 20 MG PO TABS: 20.0000 mg | ORAL_TABLET | Freq: Two times a day (BID) | ORAL | Status: DC

## 2016-06-09 MED ORDER — METOPROLOL SUCCINATE ER 25 MG PO TB24
12.5000 mg | ORAL_TABLET | Freq: Two times a day (BID) | ORAL | Status: DC
  Administered 2016-06-10 – 2016-06-13 (×6): 12.5 mg via ORAL
  Filled 2016-06-09 (×7): qty 1

## 2016-06-09 MED ORDER — ATORVASTATIN CALCIUM 80 MG PO TABS
80.0000 mg | ORAL_TABLET | Freq: Every day | ORAL | Status: DC
  Administered 2016-06-09 – 2016-06-12 (×4): 80 mg via ORAL
  Filled 2016-06-09 (×4): qty 1

## 2016-06-09 MED ORDER — TRAZODONE HCL 50 MG PO TABS
25.0000 mg | ORAL_TABLET | Freq: Every evening | ORAL | Status: DC | PRN
  Filled 2016-06-09: qty 1

## 2016-06-09 MED ORDER — BISACODYL 10 MG RE SUPP: 10.0000 mg | Freq: Every day | RECTAL | Status: DC | PRN

## 2016-06-09 MED ORDER — POTASSIUM CHLORIDE CRYS ER 20 MEQ PO TBCR
20.0000 meq | EXTENDED_RELEASE_TABLET | Freq: Every day | ORAL | Status: DC
  Administered 2016-06-09 – 2016-06-12 (×4): 20 meq via ORAL
  Filled 2016-06-09 (×4): qty 1

## 2016-06-09 MED ORDER — ONDANSETRON HCL 4 MG/2ML IJ SOLN
4.0000 mg | Freq: Four times a day (QID) | INTRAMUSCULAR | Status: DC | PRN
  Administered 2016-06-09 – 2016-06-11 (×4): 4 mg via INTRAVENOUS
  Filled 2016-06-09 (×4): qty 2

## 2016-06-09 MED ORDER — HYDROCODONE-ACETAMINOPHEN 5-325 MG PO TABS
1.0000 | ORAL_TABLET | ORAL | Status: DC | PRN
  Administered 2016-06-10 (×2): 1 via ORAL
  Administered 2016-06-15 – 2016-06-20 (×6): 2 via ORAL
  Filled 2016-06-09 (×3): qty 2
  Filled 2016-06-09: qty 1
  Filled 2016-06-09 (×2): qty 2
  Filled 2016-06-09: qty 1
  Filled 2016-06-09 (×2): qty 2

## 2016-06-09 NOTE — Consult Note (Signed)
[facility] Consult Note  Referring Provider: No ref. provider found Primary Care Physician:  Glo Herring., MD Primary Gastroenterologist:  Dr.  La[place] Dimmer Complaint: Right upper quadrant pain HPI: [name] is an 60 y.o. white male  presented with acute onset of right upper quadrant pain nausea and vomiting around midnight last night. On presentation he had moderately elevated liver function tests which were about the same as measured 1 month ago. He had a slightly elevated lipase. An ultrasound showed gallstones with no signs of cholecystitis with an 8 mm common bile duct stone. PIPIDA scan is pending. He is currently pain-free.  Past Medical History  Diagnosis Date  . Essential hypertension, benign   . CAD (coronary artery disease)     a. BMS to LAD 2001 at [facility] b. PTCA/atherectomy ramus and BMS to LAD 2009  . Chronic systolic heart failure ([place])   . GERD (gastroesophageal reflux disease)   . Paroxysmal atrial fibrillation (HCC)     a. on amiodarone, digoxin and Eliquis  . TIA (transient ischemic attack)   . HLD (hyperlipidemia)   . Orthostatic hypotension   . Dizziness     a. chronic. Admission for this 07/18/2014  . Ischemic cardiomyopathy     EF 18% by nuclear study 2016, multiple myocardial infarctions in past    . Obesity   . Hematuria   . History of blood transfusion     "I've had 2 units so far this year" (09/27/2015)  . Anemia   . SAH (subarachnoid hemorrhage) ([place])     post-traumatic (fall) [facility] 12/2014  . PONV (postoperative nausea and vomiting)   . CHF (congestive heart failure) ([place])   . Myocardial infarction ([place]) 2001  . HCAP (healthcare-associated pneumonia) 07/21/2015  . OSA on CPAP   . Chronic kidney disease, stage IV (severe) ([place])   . DM type 2, uncontrolled, with renal complications ([place])   . Colostomy in place [facility])   . Adenocarcinoma of rectum ([place])     a. 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 Prostate cancer ([place])     a. s/p seed implants with chemo and  radiation  . Cholelithiasis 06/2015    Past Surgical History  Procedure Laterality Date  . Cardiac defibrillator placement  2002  . Abdominal and perineal resection of rectum with total mesorectal excision  10/04/2007  . Colonoscopy  09/14/2011    Dr. Gala Romney: via colostomy, Single pedunculated benign inflammatory polyp. Due for surveillance Oct 2015  . Colostomy  2008  . Colonoscopy N/A 07/02/2014    Procedure: COLONOSCOPY;  Surgeon: Daneil Dolin, MD;  [facility];  Service: Endoscopy;  Laterality: N/A;  7:30 / COLONOSCOPY THRU COLOSTOMY  . Esophagogastroduodenoscopy N/A 07/02/2014    Procedure: ESOPHAGOGASTRODUODENOSCOPY (EGD);  Surgeon: Daneil Dolin, MD;  [facility];  Service: Endoscopy;  Laterality: N/A;  7:30  . Savory dilation N/A 07/02/2014    Procedure: SAVORY DILATION;  Surgeon: Daneil Dolin, MD;  [facility];  Service: Endoscopy;  Laterality: N/A;  7:30  . Maloney dilation N/A 07/02/2014    Procedure: Venia Minks DILATION;  Surgeon: Daneil Dolin, MD;  [facility];  Service: Endoscopy;  Laterality: N/A;  7:30  . Portacath placement  06/2007    "removed ~ 1 yr later"  . Left heart catheterization with coronary angiogram N/A 07/13/2013    Procedure: LEFT HEART CATHETERIZATION WITH CORONARY ANGIOGRAM;  Surgeon: Lorretta Harp, MD;  Location: [facility] CATH LAB;  Service: Cardiovascular;  Laterality:  N/A;  . Colonoscopy N/A 12/11/2014    Dr. Gala Romney via colostomy. Normal. Repeat in 2021.   Marland Kitchen Esophagogastroduodenoscopy N/A 12/11/2014    QVZ:DGLOVF EGD  . Cardiac defibrillator placement  2009    Upgraded to a BiV ICD  . Right heart catheterization N/A 02/24/2015    Procedure: RIGHT HEART CATH;  Surgeon: Jolaine Artist, MD;  Location: [facility] CATH LAB;  Service: Cardiovascular;  Laterality: N/A;  . Ep implantable device N/A 04/10/2015    Procedure: Ppm/Biv Ppm Generator Changeout;  Surgeon: Evans Lance, MD;  Location: [place] INVASIVE CV LAB CUPID;  Service:  Cardiovascular;  Laterality: N/A;  . Bi-ventricular implantable cardioverter defibrillator  (crt-d)  2009  . Cardiac catheterization  08/2001; 2009    ; /notes 07/10/2013  . Coronary angioplasty with stent placement  2001; ~ 2006    "1 + 1"   . Givens capsule study N/A 07/23/2015    Procedure: GIVENS CAPSULE STUDY;  Surgeon: Wilford Corner, MD;  Location: [facility] ENDOSCOPY;  Service: Endoscopy;  Laterality: N/A;  . Colonoscopy N/A 08/24/2015    Procedure: COLONOSCOPY;  Surgeon: Manus Gunning, MD;  Location: [place];  Service: Gastroenterology;  Laterality: N/A;  . Electrophysiologic study N/A 08/28/2015    Procedure: AV Node Ablation;  Surgeon: Will Meredith Leeds, MD;  Location: [place] CV LAB;  Service: Cardiovascular;  Laterality: N/A;  . Av fistula placement Right 09/16/2015    Procedure: ARTERIOVENOUS (AV) FISTULA CREATION - BRACHIOCEPHALIC;  Surgeon: Elam Dutch, MD;  Location: [facility] OR;  Service: Vascular;  Laterality: Right;    Medications Prior to Admission  Medication Sig Dispense Refill  . apixaban (ELIQUIS) 5 MG TABS tablet Take 1 tablet (5 mg total) by mouth 2 (two) times daily. 60 tablet 3  . atorvastatin (LIPITOR) 80 MG tablet Take 1 tablet (80 mg total) by mouth daily. 30 tablet 6  . co-enzyme Q-10 50 MG capsule Take 50 mg by mouth every morning.     . ferrous sulfate 325 (65 FE) MG tablet Take 1 tablet (325 mg total) by mouth 2 (two) times daily with a meal. 60 tablet 0  . insulin glargine (LANTUS) 100 UNIT/ML injection Inject 30 Units into the skin every morning.    . metoprolol succinate (TOPROL-XL) 25 MG 24 hr tablet Take 0.5 tablets (12.5 mg total) by mouth 2 (two) times daily. 45 tablet 3  . nitroGLYCERIN (NITROLINGUAL) 0.4 MG/SPRAY spray Place 1 spray under the tongue every 5 (five) minutes x 3 doses as needed for chest pain. Reported on 06/01/2016    . pantoprazole (PROTONIX) 40 MG tablet Take 1 tablet (40 mg total) by mouth daily. 30 tablet 0  . potassium  chloride SA (K-DUR,KLOR-CON) 20 MEQ tablet Take 20 mEq by mouth daily.    Marland Kitchen PROAIR RESPICLICK 643 (90 BASE) MCG/ACT AEPB Inhale 2 puffs into the lungs every 6 (six) hours as needed (for breathing).   0  . torsemide (DEMADEX) 20 MG tablet Take 4 tablets by mouth every morning and 2 tables by mouth every evening 180 tablet 3  . traMADol (ULTRAM) 50 MG tablet Take 1 tablet (50 mg total) by mouth every 6 (six) hours as needed. 30 tablet 1  . metolazone (ZAROXOLYN) 2.5 MG tablet Take 2.5 mg by mouth as needed. Takes about once a  month  0  . Omega-3 Fatty Acids (FISH OIL) 1000 MG CAPS Take 1,000 mg by mouth 2 (two) times daily.      Allergies: No Known  Allergies  Family History  Problem Relation Age of Onset  . Colon cancer Mother 60  . Colon cancer Sister 31  . Coronary artery disease Father   . Colon cancer Other     2 cousins, succumbed to illness    Social History:  reports that he has never smoked. He has never used smokeless tobacco. He reports that he does not drink alcohol or use illicit drugs.  Review of Systems: negative except As above   Blood pressure 118/48, pulse 69, temperature 98.7 F (37.1 C), temperature source Oral, resp. rate 18, height '5\' 10"'$  (1.778 m), weight 82.283 kg (181 lb 6.4 oz), SpO2 100 %. Head: Normocephalic, without obvious abnormality, atraumatic Neck: no adenopathy, no carotid bruit, no JVD, supple, symmetrical, trachea midline and thyroid not enlarged, symmetric, no tenderness/mass/nodules Resp: clear to auscultation bilaterally Cardio: regular rate and rhythm, S1, S2 normal, no murmur, click, rub or gallop GI: Abdomen soft nondistended with normoactive bowel sounds. No past or megaly masses or guarding. Extremities: extremities normal, atraumatic, no cyanosis or edema  Results for orders placed or performed during the hospital encounter of 06/09/16 (from the past 48 hour(s))  CBC     Status: Abnormal   Collection Time: 06/09/16  7:15 AM  Result Value  Ref Range   WBC 12.1 (H) 4.0 - 10.5 K/uL   RBC 4.74 4.22 - 5.81 MIL/uL   Hemoglobin 15.4 13.0 - 17.0 g/dL   HCT 45.4 39.0 - 52.0 %   MCV 95.8 78.0 - 100.0 fL   MCH 32.5 26.0 - 34.0 pg   MCHC 33.9 30.0 - 36.0 g/dL   RDW 12.4 11.5 - 15.5 %   Platelets 206 150 - 400 K/uL  I-stat troponin, ED     Status: None   Collection Time: 06/09/16  7:17 AM  Result Value Ref Range   Troponin i, poc 0.01 0.00 - 0.08 ng/mL   Comment 3            Comment: Due to the release kinetics of cTnI, a negative result within the first hours of the onset of symptoms does not rule out myocardial infarction with certainty. If myocardial infarction is still suspected, repeat the test at appropriate intervals.   Basic metabolic panel     Status: Abnormal   Collection Time: 06/09/16  7:59 AM  Result Value Ref Range   Sodium 131 (L) 135 - 145 mmol/L   Potassium 3.8 3.5 - 5.1 mmol/L   Chloride 90 (L) 101 - 111 mmol/L   CO2 26 22 - 32 mmol/L   Glucose, Bld 445 (H) 65 - 99 mg/dL   BUN 41 (H) 6 - 20 mg/dL   Creatinine, Ser 2.70 (H) 0.61 - 1.24 mg/dL   Calcium 10.6 (H) 8.9 - 10.3 mg/dL   GFR calc non Af Amer 24 (L) >60 mL/min   GFR calc Af Amer 28 (L) >60 mL/min    Comment: (NOTE) The eGFR has been calculated using the CKD EPI equation. This calculation has not been validated in all clinical situations. eGFR's persistently <60 mL/min signify possible Chronic Kidney Disease.    Anion gap 15 5 - 15  Hepatic function panel     Status: Abnormal   Collection Time: 06/09/16  7:59 AM  Result Value Ref Range   Total Protein 10.2 (H) 6.5 - 8.1 g/dL   Albumin 3.5 3.5 - 5.0 g/dL   AST 354 (H) 15 - 41 U/L   ALT 177 (H) 17 - 63  U/L   Alkaline Phosphatase 456 (H) 38 - 126 U/L   Total Bilirubin 3.2 (H) 0.3 - 1.2 mg/dL   Bilirubin, Direct 1.9 (H) 0.1 - 0.5 mg/dL   Indirect Bilirubin 1.3 (H) 0.3 - 0.9 mg/dL  Lipase, blood     Status: Abnormal   Collection Time: 06/09/16  7:59 AM  Result Value Ref Range   Lipase 78  (H) 11 - 51 U/L  Urinalysis, Routine w reflex microscopic     Status: Abnormal   Collection Time: 06/09/16 10:23 AM  Result Value Ref Range   Color, Urine YELLOW YELLOW   APPearance CLEAR CLEAR   Specific Gravity, Urine 1.017 1.005 - 1.030   pH 5.5 5.0 - 8.0   Glucose, UA >1000 (A) NEGATIVE mg/dL   Hgb urine dipstick NEGATIVE NEGATIVE   Bilirubin Urine NEGATIVE NEGATIVE   Ketones, ur NEGATIVE NEGATIVE mg/dL   Protein, ur NEGATIVE NEGATIVE mg/dL   Nitrite NEGATIVE NEGATIVE   Leukocytes, UA NEGATIVE NEGATIVE  Urine microscopic-add on     Status: Abnormal   Collection Time: 06/09/16 10:23 AM  Result Value Ref Range   Squamous Epithelial / LPF 0-5 (A) NONE SEEN   WBC, UA 0-5 0 - 5 WBC/hpf   RBC / HPF 0-5 0 - 5 RBC/hpf   Bacteria, UA RARE (A) NONE SEEN  Troponin I     Status: Abnormal   Collection Time: 06/09/16 10:41 AM  Result Value Ref Range   Troponin I 0.04 (HH) <0.03 ng/mL    Comment: CRITICAL RESULT CALLED TO, READ BACK BY AND VERIFIED WITH: Nicoletta Dress AT 1137 06/09/16 BY ZBEECH.   Protime-INR     Status: Abnormal   Collection Time: 06/09/16 10:41 AM  Result Value Ref Range   Prothrombin Time 16.1 (H) 11.6 - 15.2 seconds   INR 1.27 0.00 - 1.49  Glucose, capillary     Status: Abnormal   Collection Time: 06/09/16 11:36 AM  Result Value Ref Range   Glucose-Capillary 385 (H) 65 - 99 mg/dL   Comment 1 Notify RN    Dg Chest 2 View  06/09/2016  CLINICAL DATA:  Chest pain with shortness of breath for 1 day EXAM: CHEST  2 VIEW COMPARISON:  May 09, 2016 FINDINGS: There is no edema or consolidation. Heart is upper normal in size with pulmonary vascularity within normal limits. Pacemaker leads are attached to the right heart, stable. No adenopathy. No pneumothorax. There is atherosclerotic calcification in the aorta. There is degenerative change in the thoracic spine and in the shoulders bilaterally. IMPRESSION: No edema or consolidation. Stable pacemaker lead positions. Stable  cardiac silhouette. Aortic atherosclerosis. Electronically Signed   By: Lowella Grip III M.D.   On: 06/09/2016 07:28   US Abdomen Limited Ruq  06/09/2016  CLINICAL DATA:  Right upper quadrant abdominal pain since midnight, known kidney stones, cirrhosis, diabetes, prostate malignancy. EXAM: US ABDOMEN LIMITED - RIGHT UPPER QUADRANT COMPARISON:  Abdominal ultrasound of May 10, 2016 FINDINGS: Gallbladder: The gallbladder is adequately distended there is mild wall thickening to 4.3 mm. There is no positive sonographic Murphy's sign. There are echogenic mobile shadowing stones measuring up to 1.9 cm in diameter. Common bile duct: Diameter: 8.2 mm.  No intraluminal stones are observed. Liver: The hepatic echotexture is heterogeneous. The surface contour is irregular. There is no focal mass or ductal dilation. IMPRESSION: 1. Gallstones and mild gallbladder wall thickening possibly reflecting cholecystitis. No pericholecystic fluid or positive sonographic Murphy's sign. 2. Mild dilation of the common  bile duct without evidence of common duct stones. 3. Cirrhotic changes within the liver, stable. Electronically Signed   By: David  Martinique M.D.   On: 06/09/2016 08:01    Assessment: On upper quadrant abdominal pain with gallstones and elevated liver function tests (similar to one month ago) Plan:  Await PIPIDA scan Follow liver function tests, lipase General surgery consult Consider MRCP or ERCP is appropriate. Will follow with you Marcey Persad C 06/09/2016, 12:38 PM  Pager [phone] If no answer or after 5 PM call [phone]

## 2016-06-09 NOTE — ED Notes (Signed)
Admitting MD at the bedside.  

## 2016-06-09 NOTE — H&P (Signed)
History and Physical    [name]:[id number] DOB: 1956/05/18 DOA: 06/09/2016   PCP: Glo Herring., MD   Patient coming from:  Home    Chief Complaint: Right abdominal pain   HPI: [name] is a 60 y.o. male with  Extensive medical history listed below including cholecystitis but was considered a non-surgical candidate, presenting to the ED with RUQ Pain , severe, with no alleviating factors and associated with associated with nausea, and multiple episodes of vomiting, none since admission. Denies a history of hepatitis. He does smoke. No ETOH or recreational drug use. Denies shortness of breath or cough, chest pain, palpitations. He denies any fever, chills or night sweats.Denies any food poisoning  He is more comfortable after initiation of antiemetics and Morphine IV.      ED Course:  BP 104/55 mmHg  Pulse 75  Temp(Src) 97.6 F (36.4 C) (Oral)  Resp 17  Ht '5\' 10"'$  (1.778 m)  Wt 80.74 kg (178 lb)  BMI 25.54 kg/m2  SpO2 98%  Glucose 445, Anion Gap  15, corrected 16.25. Sodium131 corrected 137-139 . EGFR 24. Total bilirubin 3.2. AST 354 ALT 177 lipase 78 Troponin 0.01 White count 12.1 1. Gallstones and mild gallbladder wall thickening possibly reflecting cholecystitis. No pericholecystic fluid or positive sonographic Murphy's sign. 2. Mild dilation of the common bile duct without evidence of common duct stones.3. Cirrhotic changes within the liver, stable.  Review of Systems: As per HPI otherwise 10 point review of systems negative.   Past Medical History  Diagnosis Date  . Essential hypertension, benign   . CAD (coronary artery disease)     a. BMS to LAD 2001 at [facility] b. PTCA/atherectomy ramus and BMS to LAD 2009  . Chronic systolic heart failure ([place])   . GERD (gastroesophageal reflux disease)   . Paroxysmal atrial fibrillation (HCC)     a. on amiodarone, digoxin and Eliquis  . TIA (transient ischemic attack)   . HLD (hyperlipidemia)   . Orthostatic  hypotension   . Dizziness     a. chronic. Admission for this 07/18/2014  . Ischemic cardiomyopathy     EF 18% by nuclear study 2016, multiple myocardial infarctions in past    . Obesity   . Hematuria   . History of blood transfusion     "I've had 2 units so far this year" (09/27/2015)  . Anemia   . SAH (subarachnoid hemorrhage) ([place])     post-traumatic (fall) [facility] 12/2014  . PONV (postoperative nausea and vomiting)   . CHF (congestive heart failure) ([place])   . Myocardial infarction ([place]) 2001  . HCAP (healthcare-associated pneumonia) 07/21/2015  . OSA on CPAP   . Chronic kidney disease, stage IV (severe) ([place])   . DM type 2, uncontrolled, with renal complications ([place])   . Colostomy in place [facility])   . Adenocarcinoma of rectum ([place])     a. 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 Prostate cancer ([place])     a. s/p seed implants with chemo and radiation    Past Surgical History  Procedure Laterality Date  . Cardiac defibrillator placement  2002  . Abdominal and perineal resection of rectum with total mesorectal excision  10/04/2007  . Colonoscopy  09/14/2011    Dr. Gala Romney: via colostomy, Single pedunculated benign inflammatory polyp. Due for surveillance Oct 2015  . Colostomy  2008  . Colonoscopy N/A 07/02/2014    Procedure: COLONOSCOPY;  Surgeon: Daneil Dolin, MD;  [facility];  Service: Endoscopy;  Laterality:  N/A;  7:30 / COLONOSCOPY THRU COLOSTOMY  . Esophagogastroduodenoscopy N/A 07/02/2014    Procedure: ESOPHAGOGASTRODUODENOSCOPY (EGD);  Surgeon: Corbin Ade, MD;  Location: [facility] ENDO SUITE;  Service: Endoscopy;  Laterality: N/A;  7:30  . Savory dilation N/A 07/02/2014    Procedure: SAVORY DILATION;  Surgeon: Corbin Ade, MD;  Location: [facility] ENDO SU[facility];  Service: Endoscopy;  Laterality: N/A;  7:30  . Maloney dilation N/A 07/02/2014    Procedure: Elease Hashimoto DILATION;  Surgeon: Corbin Ade, MD;  Location: [facility] ENDO SUITE;  Service: Endoscopy;  Laterality: N/A;  7:30  . Portacath placement  06/2007     "removed ~ 1 yr later"  . Left heart catheterization with coronary angiogram N/A 07/13/2013    Procedure: LEFT HEART CATHETERIZATION WITH CORONARY ANGIOGRAM;  Surgeon: [name], MD;  Location: [facility] CATH LAB;  Service: Cardiovascular;  Laterality: N/A;  . Colonoscopy N/A 12/11/2014    Dr. Jena Gauss via colostomy. Normal. Repeat in 2021.   Marland Kitchen Esophagogastroduodenoscopy N/A 12/11/2014    JXD:BCSZSE EGD  . Cardiac defibrillator placement  2009    Upgraded to a BiV ICD  . Right heart catheterization N/A 02/24/2015    Procedure: RIGHT HEART CATH;  Surgeon: Dolores Patty, MD;  Location: [facility] CATH LAB;  Service: Cardiovascular;  Laterality: N/A;  . Ep implantable device N/A 04/10/2015    Procedure: Ppm/Biv Ppm Generator Changeout;  Surgeon: Marinus Maw, MD;  Location: [facility] INVASIVE CV LAB CUPID;  Service: Cardiovascular;  Laterality: N/A;  . Bi-ventricular implantable cardioverter defibrillator  (crt-d)  2009  . Cardiac catheterization  08/2001; 2009    ; /notes 07/10/2013  . Coronary angioplasty with stent placement  2001; ~ 2006    "1 + 1"   . Givens capsule study N/A 07/23/2015    Procedure: GIVENS CAPSULE STUDY;  Surgeon: Charlott Rakes, MD;  Location: [facility] ENDOSCOPY;  Service: Endoscopy;  Laterality: N/A;  . Colonoscopy N/A 08/24/2015    Procedure: COLONOSCOPY;  Surgeon: Ruffin Frederick, MD;  Location[facility] OR;  Service: Gastroenterology;  Laterality: N/A;  . Electrophysiologic study N/A 08/28/2015    Procedure: AV Node Ablation;  Surgeon: Will Jorja Loa, MD;  Location: [facility] INVASIVE CV LAB;  Service: Cardiovascular;  Laterality: N/A;  . Av fistula placement Right 09/16/2015    Procedure: ARTERIOVENOUS (AV) FISTULA CREATION - BRACHIOCEPHALIC;  Surgeon: Sherren Kerns, MD;  Location: [facility] OR;  Service: Vascular;  Laterality: Right;    Social History Social History   Social History  . Marital Status: Married    Spouse Name: N/A  . Number of Children: 1  . Years of Education: N/A    Occupational History  .     Social History Main Topics  . Smoking status: Never Smoker   . Smokeless tobacco: Never Used  . Alcohol Use: No     Comment: Former user 45 years ago  . Drug Use: No  . Sexual Activity: No   Other Topics Concern  . Not on file   Social History Narrative   Married 38 years   1 son, 2 grandkids    Denies alcohol, drugs, tobacco      No Known Allergies  Family History  Problem Relation Age of Onset  . Colon cancer Mother 73  . Colon cancer Sister 73  . Coronary artery disease Father   . Colon cancer Other     2 cousins, succumbed to illness      Prior to Admission medications   Medication  Sig Start Date End Date Taking? Authorizing Provider  apixaban (ELIQUIS) 5 MG TABS tablet Take 1 tablet (5 mg total) by mouth 2 (two) times daily. 04/26/16  Yes Jolaine Artist, MD  atorvastatin (LIPITOR) 80 MG tablet Take 1 tablet (80 mg total) by mouth daily. 09/04/15  Yes Larey Dresser, MD  co-enzyme Q-10 50 MG capsule Take 50 mg by mouth every morning.    Yes Historical Provider, MD  ferrous sulfate 325 (65 FE) MG tablet Take 1 tablet (325 mg total) by mouth 2 (two) times daily with a meal. 07/24/15  Yes Shanker Kristeen Mans, MD  insulin glargine (LANTUS) 100 UNIT/ML injection Inject 30 Units into the skin every morning.   Yes Historical Provider, MD  metoprolol succinate (TOPROL-XL) 25 MG 24 hr tablet Take 0.5 tablets (12.5 mg total) by mouth 2 (two) times daily. 04/12/16  Yes Jolaine Artist, MD  nitroGLYCERIN (NITROLINGUAL) 0.4 MG/SPRAY spray Place 1 spray under the tongue every 5 (five) minutes x 3 doses as needed for chest pain. Reported on 06/01/2016   Yes Historical Provider, MD  pantoprazole (PROTONIX) 40 MG tablet Take 1 tablet (40 mg total) by mouth daily. 05/07/16  Yes Shirley Friar, PA-C  potassium chloride SA (K-DUR,KLOR-CON) 20 MEQ tablet Take 20 mEq by mouth daily.   Yes Historical Provider, MD  PROAIR RESPICLICK 956 (90 BASE) MCG/ACT  AEPB Inhale 2 puffs into the lungs every 6 (six) hours as needed (for breathing).  08/15/15  Yes Historical Provider, MD  torsemide (DEMADEX) 20 MG tablet Take 4 tablets by mouth every morning and 2 tables by mouth every evening 04/12/16  Yes Amy D Clegg, NP  traMADol (ULTRAM) 50 MG tablet Take 1 tablet (50 mg total) by mouth every 6 (six) hours as needed. 05/12/16  Yes Reyne Dumas, MD  metolazone (ZAROXOLYN) 2.5 MG tablet Take 2.5 mg by mouth as needed. Takes about once a  month 05/02/16   Historical Provider, MD  Omega-3 Fatty Acids (FISH OIL) 1000 MG CAPS Take 1,000 mg by mouth 2 (two) times daily.    Historical Provider, MD    Physical Exam:    Filed Vitals:   06/09/16 0915 06/09/16 0930 06/09/16 0945 06/09/16 1015  BP: 112/51 130/59 112/57 104/55  Pulse: 72 73 74 75  Temp:      TempSrc:      Resp: '18 24 13 17  '$ Height:      Weight:      SpO2: 98% 100% 100% 98%       Constitutional: NAD, calm, comfortable at this time  Filed Vitals:   06/09/16 0915 06/09/16 0930 06/09/16 0945 06/09/16 1015  BP: 112/51 130/59 112/57 104/55  Pulse: 72 73 74 75  Temp:      TempSrc:      Resp: '18 24 13 17  '$ Height:      Weight:      SpO2: 98% 100% 100% 98%   Eyes: PERRL, lids and conjunctivae normal ENMT: Mucous membranes mois Posterior pharynx clear of any exudate or lesions.Normal dentition.  Neck: normal, supple, no masses, no thyromegaly Respiratory: clear to auscultation bilaterally, no wheezing, no crackles. Normal respiratory effort. No accessory muscle use.  Cardiovascular:  Paced Regular rate and rhythm, no murmurs / rubs / gallops. No extremity edema. 2+ pedal pulses. No carotid bruits.  Abdomen:RUQ  Tenderness with guarding , no masses palpated. No hepatosplenomegaly. Bowel sounds positive.  Musculoskeletal: no clubbing / cyanosis. No joint deformity upper and lower extremities.  Good ROM, no contractures. Normal muscle tone.  Skin: venous stasis changes with suspicious cellulitic  changes on the R inner tibial region. No open ulcers. Skin dry with several areas of echymmoses  Neurologic: CN 2-12 grossly intact. Sensation intact, DTR normal. Strength 5/5 in all 4.  Psychiatric: Normal judgment and insight. Alert and oriented x 3. Normal mood.     Labs on Admission: I have personally reviewed following labs and imaging studies  CBC:  Recent Labs Lab 06/09/16 0715  WBC 12.1*  HGB 15.4  HCT 45.4  MCV 95.8  PLT 206    Basic Metabolic Panel:  Recent Labs Lab 06/09/16 0759  NA 131*  K 3.8  CL 90*  CO2 26  GLUCOSE 445*  BUN 41*  CREATININE 2.70*  CALCIUM 10.6*    GFR: Estimated Creatinine Clearance: 29.7 mL/min (by C-G formula based on Cr of 2.7).  Liver Function Tests:  Recent Labs Lab 06/09/16 0759  AST 354*  ALT 177*  ALKPHOS 456*  BILITOT 3.2*  PROT 10.2*  ALBUMIN 3.5    Recent Labs Lab 06/09/16 0759  LIPASE 78*   No results for input(s): AMMONIA in the last 168 hours.  Coagulation Profile: No results for input(s): INR, PROTIME in the last 168 hours.  Cardiac Enzymes: No results for input(s): CKTOTAL, CKMB, CKMBINDEX, TROPONINI in the last 168 hours.  BNP (last 3 results) No results for input(s): PROBNP in the last 8760 hours.  HbA1C: No results for input(s): HGBA1C in the last 72 hours.  CBG: No results for input(s): GLUCAP in the last 168 hours.  Lipid Profile: No results for input(s): CHOL, HDL, LDLCALC, TRIG, CHOLHDL, LDLDIRECT in the last 72 hours.  Thyroid Function Tests: No results for input(s): TSH, T4TOTAL, FREET4, T3FREE, THYROIDAB in the last 72 hours.  Anemia Panel: No results for input(s): VITAMINB12, FOLATE, FERRITIN, TIBC, IRON, RETICCTPCT in the last 72 hours.  Urine analysis:    Component Value Date/Time   COLORURINE YELLOW 08/23/2015 0322   APPEARANCEUR CLOUDY* 08/23/2015 0322   LABSPEC 1.015 08/23/2015 0322   PHURINE 5.0 08/23/2015 0322   GLUCOSEU NEGATIVE 08/23/2015 0322   HGBUR NEGATIVE  08/23/2015 0322   BILIRUBINUR NEGATIVE 08/23/2015 0322   KETONESUR NEGATIVE 08/23/2015 0322   PROTEINUR 30* 08/23/2015 0322   UROBILINOGEN 1.0 08/23/2015 0322   NITRITE NEGATIVE 08/23/2015 0322   LEUKOCYTESUR NEGATIVE 08/23/2015 0322    Sepsis Labs: Invalid input(s): PROCALCITONIN, LACTICIDVEN )No results found for this or any previous visit (from the past 240 hour(s)).   Radiological Exams on Admission: Dg Chest 2 View  06/09/2016  CLINICAL DATA:  Chest pain with shortness of breath for 1 day EXAM: CHEST  2 VIEW COMPARISON:  May 09, 2016 FINDINGS: There is no edema or consolidation. Heart is upper normal in size with pulmonary vascularity within normal limits. Pacemaker leads are attached to the right heart, stable. No adenopathy. No pneumothorax. There is atherosclerotic calcification in the aorta. There is degenerative change in the thoracic spine and in the shoulders bilaterally. IMPRESSION: No edema or consolidation. Stable pacemaker lead positions. Stable cardiac silhouette. Aortic atherosclerosis. Electronically Signed   By: Bretta Bang III M.D.   On: 06/09/2016 07:28   US Abdomen Limited Ruq  06/09/2016  CLINICAL DATA:  Right upper quadrant abdominal pain since midnight, known kidney stones, cirrhosis, diabetes, prostate malignancy. EXAM: US ABDOMEN LIMITED - RIGHT UPPER QUADRANT COMPARISON:  Abdominal ultrasound of May 10, 2016 FINDINGS: Gallbladder: The gallbladder is adequately distended there is mild  wall thickening to 4.3 mm. There is no positive sonographic Murphy's sign. There are echogenic mobile shadowing stones measuring up to 1.9 cm in diameter. Common bile duct: Diameter: 8.2 mm.  No intraluminal stones are observed. Liver: The hepatic echotexture is heterogeneous. The surface contour is irregular. There is no focal mass or ductal dilation. IMPRESSION: 1. Gallstones and mild gallbladder wall thickening possibly reflecting cholecystitis. No pericholecystic fluid or positive  sonographic Murphy's sign. 2. Mild dilation of the common bile duct without evidence of common duct stones. 3. Cirrhotic changes within the liver, stable. Electronically Signed   By: David  Martinique M.D.   On: 06/09/2016 08:01    EKG: Independently reviewed.  Assessment/Plan Active Problems:   History of rectal cancer   Ischemic cardiomyopathy-EF 35% 04/24/15 echo   CAD with LCX disease and stents to LAD   Chronic systolic heart failure (HCC)   Paroxysmal atrial fibrillation (HCC)   DM type 2, uncontrolled, with renal complications (HCC)   Gallbladder polyp   GERD (gastroesophageal reflux disease)   Orthostatic hypotension   OSA on CPAP   Hyperlipidemia   Transaminitis   Anemia of chronic disease   Right heart failure (HCC)   Cholecystitis     Cholelithiasis/cholecystitis. Patient readmitted with same issues 6/5, not a surgical candidate at the time due to myriad of cardiac issues .  Abd [place] today shows Gallstones and mild gallbladder wall thickening possibly reflecting cholecystitis. No pericholecystic fluid or positive sonographic Murphy's sign.  Mild dilation of the common bile duct without evidence of commonduct stones.  Total bilirubin 3.2. AST 354 ALT 177 lipase 78.  -keep NPO until seen by Surgery. Consult called  NPO except for sips with meds  HIDA scan this morning  GI consult for evaluation of elevated LFT and CBD dilatation on Ultrasound  -Cardiology  informed of the patient's admission as he may need surgical clearance . Will consult  Continue coverage  Zosyn    Atrial Fibrillation CHA2DS2-VASc score 3, on anticoagulation with Eliquis  Rate controlled  -Continue home meds  Hold Eliquis anticipating possible surgery. If  No a candidate for procedure, will resume anticoagulant   Chronic diastolic heart failure, last echocardiogram 6/6  with  Severely dilated cavity, mild LVH, EF 10-15 % Dry weight  82 kg, current weight 82.2 . Appears compensated.    - Careful use of  IVF - Daily weights, strict I/O   Chronic kidney disease stage  IV   baseline creatinine  2.6, currently at 2.7   No acute changes.  HAs a R AVF if needed for HD  Lab Results  Component Value Date   CREATININE 2.70* 06/09/2016   CREATININE 2.74* 06/01/2016   CREATININE 2.66* 05/12/2016  Repeat CMET in am   CAD,  Tn 0.01  EKG  Shows  paced rhythm , last cardiac catheterization with stent in 2015,  patient is cardiac pain free at this time. Continue meds   History of Hypotension stable for now BP 104/55 mmHg  Pulse 75  IVF on hold in the setting of severe CHF /Afib with risk of volume overload  Continue to monitor    Type II Diabetes Current blood sugar level is Glucose 445, Anion Gap  15, corrected 16.25. Lab Results  Component Value Date   HGBA1C 11.5* 05/10/2016  Hgb A1C Hold home oral diabetic medications.  Lantus 1/2 dose bid, SSI (moderate) Accucheck q 4 h NPO for now, can advance to carb modified diet when ok with Surgery.  OSA on CPAP/COPD, not on acute exacerbation, stable  Continue CPAP and O2  Leukocytosis, likely reactive due to cholecystitis, pain, inflammation versus underlying infection versus possible cellulitic changes seen on exam  on the R lower extremity. WBC 12.1  CXR without acute disease.  Afebrile  Continue Zosyn for now Will draw cultures   Repeat CBC in AM  GERD, no acute symptoms: Continue PPI  History of Rectal Cancer, s/p chemo/ XRT No acute changes. Can follow as OP  Consult to Left  colostomy care   DVT prophylaxis:  SCD's  Code Status:   Full     Family Communication:  Discussed with patient  Disposition Plan: Expect patient to be discharged to home after condition improves Consults called:   GI , Cards, Gen Surgery  Admission status:Tele  Obs   [facility] E, PA-C [facility]   If 7PM-7AM, please contact night-coverage [email or web address] Password [email or web address]  06/09/2016, 10:25 AM

## 2016-06-09 NOTE — ED Notes (Signed)
Patient provided an urine sample

## 2016-06-09 NOTE — ED Notes (Signed)
Attempted Report x1.   

## 2016-06-09 NOTE — ED Notes (Addendum)
Pt is aware urine is needed, pt is unable to urinate at this time. Urinal at bedside.  

## 2016-06-09 NOTE — ED Notes (Signed)
Patient to be transported by [place], EMT

## 2016-06-09 NOTE — ED Notes (Addendum)
Pt. reports central chest pain with SOB  and emesis onset this morning , denies cough or diaphoresis . His cardiologist is Dr. Missy Sabins.

## 2016-06-09 NOTE — ED Notes (Signed)
Pt attempting to urinate with no success due to the fact that he urinated right before he came to the hospital. Denies urinary retention.

## 2016-06-09 NOTE — Progress Notes (Signed)
Patient arrived to the unit from the ED via stretcher.  Patient ambulated to bed with no assistance.  Patient is alert and oriented and saying that his pain is under control from the morphine in the emergency room.  Patient oriented to room.  Will continue to monitor.

## 2016-06-09 NOTE — Consult Note (Signed)
[facility] Surgery Consult Note  [name] [birth year]  [id number].    Requesting MD: Dr. Nanda Quinton  Chief Complaint/Reason for Consult: RUQ pain  HPI:  Patient has a past medical history relevant for symptomatic cholelithiasis, CAD, ischemic cardiomyopathy (EF 20-25%), BiV ICD,chronic atrial fibrilation s/p AVN ablation 9/16, CKD, SAH 1/16 after a fall, and rectal cancer, diagnosed in 2008 s/p colectomy and colostomy.   Patient presented to ED at [facility] on 06/09/2016 with 10/10 stabbing RUQ and LUQ abdominal pain that started at 12:00 am after consuming blackberry cobbler. Patient's pain was also associated with multiple episodes of vomiting. Patient states that pain was not relieved until he received morphine at the emergency department. Patient denies aggravating factors. Patient was last admitted for cholelithiasis with elevated LFTs in 05/10/2016 which was treated non-surgically with zosyn. A percutaneous drain per IR was recommended if patient worsened, as opposed to cholecystectomy, due to multiple comorbidities. Patient denies chest pain, shortness of breath, fatigue, weight loss, and dysuria. Ultrasound findings in the ED today indicated multiple gallstones and gallbladder wall thickening. WBC was 12.1.   Patient underwent a restaging CT of the abdomen and pelvis on 05/05/2016 which revealed no change in presacral thickening compared to CT on 07/31/2015. There was no evidence of metastatic disease.   ROS: All systems reviewed and otherwise negative except for as above  Family History  Problem Relation Age of Onset  . Colon cancer Mother 63  . Colon cancer Sister 29  . Coronary artery disease Father   . Colon cancer Other     2 cousins, succumbed to illness    Past Medical History  Diagnosis Date  . Essential hypertension, benign   . CAD (coronary artery disease)     a. BMS to LAD 2001 at [facility] b. PTCA/atherectomy ramus and BMS to LAD 2009  . Chronic systolic heart failure  ([place])   . GERD (gastroesophageal reflux disease)   . Paroxysmal atrial fibrillation (HCC)     a. on amiodarone, digoxin and Eliquis  . TIA (transient ischemic attack)   . HLD (hyperlipidemia)   . Orthostatic hypotension   . Dizziness     a. chronic. Admission for this 07/18/2014  . Ischemic cardiomyopathy     EF 18% by nuclear study 2016, multiple myocardial infarctions in past    . Obesity   . Hematuria   . History of blood transfusion     "I've had 2 units so far this year" (09/27/2015)  . Anemia   . SAH (subarachnoid hemorrhage) ([place])     post-traumatic (fall) [facility] 12/2014  . PONV (postoperative nausea and vomiting)   . CHF (congestive heart failure) ([place])   . Myocardial infarction ([place]) 2001  . HCAP (healthcare-associated pneumonia) 07/21/2015  . OSA on CPAP   . Chronic kidney disease, stage IV (severe) ([place])   . DM type 2, uncontrolled, with renal complications ([place])   . Colostomy in place [facility])   . Adenocarcinoma of rectum ([place])     a. 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 Prostate cancer ([place])     a. s/p seed implants with chemo and radiation    Past Surgical History  Procedure Laterality Date  . Cardiac defibrillator placement  2002  . Abdominal and perineal resection of rectum with total mesorectal excision  10/04/2007  . Colonoscopy  09/14/2011    Dr. Gala Romney: via colostomy, Single pedunculated benign inflammatory polyp. Due for surveillance Oct 2015  . Colostomy  2008  . Colonoscopy N/A 07/02/2014  Procedure: COLONOSCOPY;  Surgeon: Daneil Dolin, MD;  [facility];  Service: Endoscopy;  Laterality: N/A;  7:30 / COLONOSCOPY THRU COLOSTOMY  . Esophagogastroduodenoscopy N/A 07/02/2014    Procedure: ESOPHAGOGASTRODUODENOSCOPY (EGD);  Surgeon: Daneil Dolin, MD;  [facility];  Service: Endoscopy;  Laterality: N/A;  7:30  . Savory dilation N/A 07/02/2014    Procedure: SAVORY DILATION;  Surgeon: Daneil Dolin, MD;  [facility];  Service: Endoscopy;   Laterality: N/A;  7:30  . Maloney dilation N/A 07/02/2014    Procedure: Venia Minks DILATION;  Surgeon: Daneil Dolin, MD;  [facility];  Service: Endoscopy;  Laterality: N/A;  7:30  . Portacath placement  06/2007    "removed ~ 1 yr later"  . Left heart catheterization with coronary angiogram N/A 07/13/2013    Procedure: LEFT HEART CATHETERIZATION WITH CORONARY ANGIOGRAM;  Surgeon: Lorretta Harp, MD;  Location: [facility] CATH LAB;  Service: Cardiovascular;  Laterality: N/A;  . Colonoscopy N/A 12/11/2014    Dr. Gala Romney via colostomy. Normal. Repeat in 2021.   Marland Kitchen Esophagogastroduodenoscopy N/A 12/11/2014    YIR:SWNIOE EGD  . Cardiac defibrillator placement  2009    Upgraded to a BiV ICD  . Right heart catheterization N/A 02/24/2015    Procedure: RIGHT HEART CATH;  Surgeon: Jolaine Artist, MD;  Location: [facility] CATH LAB;  Service: Cardiovascular;  Laterality: N/A;  . Ep implantable device N/A 04/10/2015    Procedure: Ppm/Biv Ppm Generator Changeout;  Surgeon: Evans Lance, MD;  Location: [place] INVASIVE CV LAB CUPID;  Service: Cardiovascular;  Laterality: N/A;  . Bi-ventricular implantable cardioverter defibrillator  (crt-d)  2009  . Cardiac catheterization  08/2001; 2009    ; /notes 07/10/2013  . Coronary angioplasty with stent placement  2001; ~ 2006    "1 + 1"   . Givens capsule study N/A 07/23/2015    Procedure: GIVENS CAPSULE STUDY;  Surgeon: Wilford Corner, MD;  Location: [facility] ENDOSCOPY;  Service: Endoscopy;  Laterality: N/A;  . Colonoscopy N/A 08/24/2015    Procedure: COLONOSCOPY;  Surgeon: Manus Gunning, MD;  Location: [place];  Service: Gastroenterology;  Laterality: N/A;  . Electrophysiologic study N/A 08/28/2015    Procedure: AV Node Ablation;  Surgeon: Will Meredith Leeds, MD;  Location: [place] CV LAB;  Service: Cardiovascular;  Laterality: N/A;  . Av fistula placement Right 09/16/2015    Procedure: ARTERIOVENOUS (AV) FISTULA CREATION - BRACHIOCEPHALIC;  Surgeon: Elam Dutch,  MD;  Location: [place];  Service: Vascular;  Laterality: Right;    Social History:  reports that he has never smoked. He has never used smokeless tobacco. He reports that he does not drink alcohol or use illicit drugs.  Allergies: No Known Allergies   (Not in a hospital admission)  Blood pressure 115/54, pulse 70, temperature 97.6 F (36.4 C), temperature source Oral, resp. rate 11, height 5' 10" (1.778 m), weight 178 lb (80.74 kg), SpO2 100 %. Physical Exam: General: pleasant, WD/WN white male who is laying in bed in NAD HEENT: head is normocephalic, atraumatic.  Sclera are noninjected.  PERRL.  Ears and nose without any masses or lesions.  Mouth is pink and moist Heart: regular, rate, and rhythm.  No obvious murmurs, gallops, or rubs noted.  Palpable pedal pulses bilaterally Lungs: CTAB, no wheezes, rhonchi, or rales noted.  Respiratory effort nonlabored Abd: soft, ND, +BS, no masses, hernias, or organomegaly. Patient is tender to palpation with guarding in the RLQ. MS: all  4 extremities are symmetrical with no cyanosis, clubbing, or edema. Skin: Patient has a 5x6 cm region of irregular erythema without lesions or drainage. Skin is warm to touch.  Psych: A&Ox3 with an appropriate affect. Neuro: CM 2-12 intact, extremity CSM intact bilaterally, normal speech  Results for orders placed or performed during the hospital encounter of 06/09/16 (from the past 48 hour(s))  CBC     Status: Abnormal   Collection Time: 06/09/16  7:15 AM  Result Value Ref Range   WBC 12.1 (H) 4.0 - 10.5 K/uL   RBC 4.74 4.22 - 5.81 MIL/uL   Hemoglobin 15.4 13.0 - 17.0 g/dL   HCT 45.4 39.0 - 52.0 %   MCV 95.8 78.0 - 100.0 fL   MCH 32.5 26.0 - 34.0 pg   MCHC 33.9 30.0 - 36.0 g/dL   RDW 12.4 11.5 - 15.5 %   Platelets 206 150 - 400 K/uL  I-stat troponin, ED     Status: None   Collection Time: 06/09/16  7:17 AM  Result Value Ref Range   Troponin i, poc 0.01 0.00 - 0.08 ng/mL   Comment 3            Comment: Due  to the release kinetics of cTnI, a negative result within the first hours of the onset of symptoms does not rule out myocardial infarction with certainty. If myocardial infarction is still suspected, repeat the test at appropriate intervals.   Basic metabolic panel     Status: Abnormal   Collection Time: 06/09/16  7:59 AM  Result Value Ref Range   Sodium 131 (L) 135 - 145 mmol/L   Potassium 3.8 3.5 - 5.1 mmol/L   Chloride 90 (L) 101 - 111 mmol/L   CO2 26 22 - 32 mmol/L   Glucose, Bld 445 (H) 65 - 99 mg/dL   BUN 41 (H) 6 - 20 mg/dL   Creatinine, Ser 2.70 (H) 0.61 - 1.24 mg/dL   Calcium 10.6 (H) 8.9 - 10.3 mg/dL   GFR calc non Af Amer 24 (L) >60 mL/min   GFR calc Af Amer 28 (L) >60 mL/min    Comment: (NOTE) The eGFR has been calculated using the CKD EPI equation. This calculation has not been validated in all clinical situations. eGFR's persistently <60 mL/min signify possible Chronic Kidney Disease.    Anion gap 15 5 - 15  Hepatic function panel     Status: Abnormal   Collection Time: 06/09/16  7:59 AM  Result Value Ref Range   Total Protein 10.2 (H) 6.5 - 8.1 g/dL   Albumin 3.5 3.5 - 5.0 g/dL   AST 354 (H) 15 - 41 U/L   ALT 177 (H) 17 - 63 U/L   Alkaline Phosphatase 456 (H) 38 - 126 U/L   Total Bilirubin 3.2 (H) 0.3 - 1.2 mg/dL   Bilirubin, Direct 1.9 (H) 0.1 - 0.5 mg/dL   Indirect Bilirubin 1.3 (H) 0.3 - 0.9 mg/dL  Lipase, blood     Status: Abnormal   Collection Time: 06/09/16  7:59 AM  Result Value Ref Range   Lipase 78 (H) 11 - 51 U/L   Dg Chest 2 View  06/09/2016  CLINICAL DATA:  Chest pain with shortness of breath for 1 day EXAM: CHEST  2 VIEW COMPARISON:  May 09, 2016 FINDINGS: There is no edema or consolidation. Heart is upper normal in size with pulmonary vascularity within normal limits. Pacemaker leads are attached to the right heart, stable. No adenopathy. No pneumothorax.  There is atherosclerotic calcification in the aorta. There is degenerative change in the  thoracic spine and in the shoulders bilaterally. IMPRESSION: No edema or consolidation. Stable pacemaker lead positions. Stable cardiac silhouette. Aortic atherosclerosis. Electronically Signed   By: Lowella Grip III M.D.   On: 06/09/2016 07:28   US Abdomen Limited Ruq  06/09/2016  CLINICAL DATA:  Right upper quadrant abdominal pain since midnight, known kidney stones, cirrhosis, diabetes, prostate malignancy. EXAM: US ABDOMEN LIMITED - RIGHT UPPER QUADRANT COMPARISON:  Abdominal ultrasound of May 10, 2016 FINDINGS: Gallbladder: The gallbladder is adequately distended there is mild wall thickening to 4.3 mm. There is no positive sonographic Murphy's sign. There are echogenic mobile shadowing stones measuring up to 1.9 cm in diameter. Common bile duct: Diameter: 8.2 mm.  No intraluminal stones are observed. Liver: The hepatic echotexture is heterogeneous. The surface contour is irregular. There is no focal mass or ductal dilation. IMPRESSION: 1. Gallstones and mild gallbladder wall thickening possibly reflecting cholecystitis. No pericholecystic fluid or positive sonographic Murphy's sign. 2. Mild dilation of the common bile duct without evidence of common duct stones. 3. Cirrhotic changes within the liver, stable. Electronically Signed   By: David  Martinique M.D.   On: 06/09/2016 08:01      Assessment/Plan 1. Acute on Chronic Cholecystitis Patient presented with RUQ and LUQ that radiated to the back. WBC 12.1, Alk phos 456, ALT 354, AST 177, TB 3.2 with VSS. Ultrasound conducted in ED showed multiple gallstones with gallbladder wall thickening. Hida scan ordered. Recommend consulting GI for further work up of possible choledocholithiasis. FEN: NPO  ID: [email or web address]  Dispo: Admit per medicine    Lannie Fields, [facility] Surgery 06/09/2016, 9:49 AM Pager: [phone] Consults: [phone] Mon-Fri 7:00 am-4:30 pm Sat-Sun 7:00 am-11:30 am

## 2016-06-09 NOTE — ED Provider Notes (Signed)
Emergency Department Provider Note  Time seen: Approximately 7:34 AM  I have reviewed the triage vital signs and the nursing notes.   HISTORY  Chief Complaint Chest Pain  HPI [name] is a 60 y.o. male with PMH of ischemic cardiomyopathy (EF 35%), CAD with stents, paroxysmal afib, DM, GERD, and HLD presents to the emergency department for evaluation of right upper quadrant abdominal discomfort and difficulty breathing. The patient states that symptoms began last night and feels similar to prior issues with the gallbladder. He reports that because of multiple medical comorbidities are unable to remove the gallbladder and said that he might need a drain placed. He has had a heart attack in the past and states his chest discomfort does not feel similar to that episode. He denies significant difficulty breathing. He also notes a mildly painful red rash to his right lower extremity. He's been compliant with his home medications. He denies any associated vomiting. Symptoms began last night in the setting of eating a sandwich. He does have an ostomy bag but continues to have output.  Past Medical History  Diagnosis Date  . Essential hypertension, benign   . CAD (coronary artery disease)     a. BMS to LAD 2001 at [facility] b. PTCA/atherectomy ramus and BMS to LAD 2009  . Chronic systolic heart failure ([place])   . GERD (gastroesophageal reflux disease)   . Paroxysmal atrial fibrillation (HCC)     a. on amiodarone, digoxin and Eliquis  . TIA (transient ischemic attack)   . HLD (hyperlipidemia)   . Orthostatic hypotension   . Dizziness     a. chronic. Admission for this 07/18/2014  . Ischemic cardiomyopathy     EF 18% by nuclear study 2016, multiple myocardial infarctions in past    . Obesity   . Hematuria   . History of blood transfusion     "I've had 2 units so far this year" (09/27/2015)  . Anemia   . SAH (subarachnoid hemorrhage) ([place])     post-traumatic (fall) [facility] 12/2014  . PONV  (postoperative nausea and vomiting)   . CHF (congestive heart failure) ([place])   . Myocardial infarction ([place]) 2001  . HCAP (healthcare-associated pneumonia) 07/21/2015  . OSA on CPAP   . Chronic kidney disease, stage IV (severe) ([place])   . DM type 2, uncontrolled, with renal complications ([place])   . Colostomy in place [facility])   . Adenocarcinoma of rectum ([place])     a. 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 Prostate cancer ([place])     a. s/p seed implants with chemo and radiation    Patient Active Problem List   Diagnosis Date Noted  . Abnormal liver function   . Abnormal LFTs 05/10/2016  . Chest pain 05/10/2016  . Acute cholecystitis 05/10/2016  . Pain in the chest   . CKD (chronic kidney disease) 11/20/2015  . Aftercare following surgery of the circulatory system 11/20/2015  . Right heart failure ([place]) 09/26/2015  . History of colonic polyps 09/24/2015  . Chronic atrial fibrillation ([place])   . Bleeding from colostomy stoma ([place]) 08/23/2015  . Atrial fibrillation, chronic ([place]) 08/07/2015  . Anemia of chronic disease 07/21/2015  . Constipation 05/01/2015  . Transaminitis 05/01/2015  . NSTEMI- 04/23/15- Myoview scar- no cath 04/27/2015  . Chronic cholecystitis 04/04/2015  . Cystitis with hematuria-April 2016 04/04/2015  . Chronically elevated transaminase level-Amiodarone resumed 04/24/15 04/03/2015  . H/O ventricular fibrillation-March 2016 02/24/2015  . Cardiogenic shock ([place]) 02/24/2015  . BiV ICD (St Jude)  gen change 04/10/15 02/24/2015  . Acute on chronic systolic congestive heart failure ([place])   . Hyperlipidemia   . Dietary noncompliance   . Orthostatic hypotension 07/19/2014  . OSA on CPAP   . GERD (gastroesophageal reflux disease) 06/05/2014  . Gallbladder polyp 03/06/2014  . DM type 2, uncontrolled, with renal complications ([place]) [id number]  . Dizziness 01/26/2014  . Obesity 12/12/2013  . Chronic hypotension 12/11/2013  . Chronic kidney disease, stage IV (severe) ([place]) 12/11/2013  . Paroxysmal  atrial fibrillation (HCC)   . Chronic systolic heart failure ([place]) 08/12/2013  . CAD with LCX disease and stents to LAD 07/10/2013  . Keoshia Steinmetz term current use of anticoagulant therapy 03/23/2013  . History of rectal cancer   . Ischemic cardiomyopathy-EF 35% 04/24/15 echo     Past Surgical History  Procedure Laterality Date  . Cardiac defibrillator placement  2002  . Abdominal and perineal resection of rectum with total mesorectal excision  10/04/2007  . Colonoscopy  09/14/2011    Dr. Gala Romney: via colostomy, Single pedunculated benign inflammatory polyp. Due for surveillance Oct 2015  . Colostomy  2008  . Colonoscopy N/A 07/02/2014    Procedure: COLONOSCOPY;  Surgeon: Daneil Dolin, MD;  [facility];  Service: Endoscopy;  Laterality: N/A;  7:30 / COLONOSCOPY THRU COLOSTOMY  . Esophagogastroduodenoscopy N/A 07/02/2014    Procedure: ESOPHAGOGASTRODUODENOSCOPY (EGD);  Surgeon: Daneil Dolin, MD;  [facility];  Service: Endoscopy;  Laterality: N/A;  7:30  . Savory dilation N/A 07/02/2014    Procedure: SAVORY DILATION;  Surgeon: Daneil Dolin, MD;  [facility];  Service: Endoscopy;  Laterality: N/A;  7:30  . Maloney dilation N/A 07/02/2014    Procedure: Venia Minks DILATION;  Surgeon: Daneil Dolin, MD;  [facility];  Service: Endoscopy;  Laterality: N/A;  7:30  . Portacath placement  06/2007    "removed ~ 1 yr later"  . Left heart catheterization with coronary angiogram N/A 07/13/2013    Procedure: LEFT HEART CATHETERIZATION WITH CORONARY ANGIOGRAM;  Surgeon: Lorretta Harp, MD;  Location: [facility] CATH LAB;  Service: Cardiovascular;  Laterality: N/A;  . Colonoscopy N/A 12/11/2014    Dr. Gala Romney via colostomy. Normal. Repeat in 2021.   Marland Kitchen Esophagogastroduodenoscopy N/A 12/11/2014    [id number] EGD  . Cardiac defibrillator placement  2009    Upgraded to a BiV ICD  . Right heart catheterization N/A 02/24/2015    Procedure: RIGHT HEART CATH;  Surgeon: Jolaine Artist, MD;  Location: [facility] CATH LAB;  Service: Cardiovascular;  Laterality: N/A;  . Ep implantable device N/A 04/10/2015    Procedure: Ppm/Biv Ppm Generator Changeout;  Surgeon: Evans Lance, MD;  Location: [facility] INVASIVE CV LAB CUPID;  Service: Cardiovascular;  Laterality: N/A;  . Bi-ventricular implantable cardioverter defibrillator  (crt-d)  2009  . Cardiac catheterization  08/2001; 2009    ; /notes 07/10/2013  . Coronary angioplasty with stent placement  2001; ~ 2006    "1 + 1"   . Givens capsule study N/A 07/23/2015    Procedure: GIVENS CAPSULE STUDY;  Surgeon: Wilford Corner, MD;  Location: [facility] ENDOSCOPY;  Service: Endoscopy;  Laterality: N/A;  . Colonoscopy N/A 08/24/2015    Procedure: COLONOSCOPY;  Surgeon: Manus Gunning, MD;  Location: [place];  Service: Gastroenterology;  Laterality: N/A;  . Electrophysiologic study N/A 08/28/2015    Procedure: AV Node Ablation;  Surgeon: Will Meredith Leeds, MD;  Location: [place] CV LAB;  Service: Cardiovascular;  Laterality: N/A;  . Av fistula placement Right 09/16/2015    Procedure: ARTERIOVENOUS (AV) FISTULA CREATION - BRACHIOCEPHALIC;  Surgeon: Elam Dutch, MD;  Location: [facility] OR;  Service: Vascular;  Laterality: Right;    Current Outpatient Rx  Name  Route  Sig  Dispense  Refill  . apixaban (ELIQUIS) 5 MG TABS tablet   Oral   Take 1 tablet (5 mg total) by mouth 2 (two) times daily.   60 tablet   3     Please cancel all previous orders for current medi ...   . atorvastatin (LIPITOR) 80 MG tablet   Oral   Take 1 tablet (80 mg total) by mouth daily.   30 tablet   6   . co-enzyme Q-10 50 MG capsule   Oral   Take 50 mg by mouth every morning.          . ferrous sulfate 325 (65 FE) MG tablet   Oral   Take 1 tablet (325 mg total) by mouth 2 (two) times daily with a meal.   60 tablet   0   . insulin glargine (LANTUS) 100 UNIT/ML injection   Subcutaneous   Inject 30 Units into the skin every morning.         .  metolazone (ZAROXOLYN) 2.5 MG tablet   Oral   Take 2.5 mg by mouth as needed. Takes about once a  month      0   . metoprolol succinate (TOPROL-XL) 25 MG 24 hr tablet   Oral   Take 0.5 tablets (12.5 mg total) by mouth 2 (two) times daily.   45 tablet   3   . nitroGLYCERIN (NITROLINGUAL) 0.4 MG/SPRAY spray   Sublingual   Place 1 spray under the tongue every 5 (five) minutes x 3 doses as needed for chest pain. Reported on 06/01/2016         . Omega-3 Fatty Acids (FISH OIL) 1000 MG CAPS   Oral   Take 1,000 mg by mouth 2 (two) times daily.         . pantoprazole (PROTONIX) 40 MG tablet   Oral   Take 1 tablet (40 mg total) by mouth daily.   30 tablet   0     Needs fu no further refills   . potassium chloride SA (K-DUR,KLOR-CON) 20 MEQ tablet   Oral   Take 20 mEq by mouth daily.         Marland Kitchen PROAIR RESPICLICK [id number] (90 BASE) MCG/ACT AEPB   Inhalation   Inhale 2 puffs into the lungs every 6 (six) hours as needed (for breathing).       0     Dispense as written.   . torsemide (DEMADEX) 20 MG tablet      Take 4 tablets by mouth every morning and 2 tables by mouth every evening   180 tablet   3   . traMADol (ULTRAM) 50 MG tablet   Oral   Take 1 tablet (50 mg total) by mouth every 6 (six) hours as needed.   30 tablet   1     Allergies Review of patient's allergies indicates no known allergies.  Family History  Problem Relation Age of Onset  . Colon cancer Mother 36  . Colon cancer Sister 64  . Coronary artery disease Father   . Colon cancer Other     2 cousins, succumbed to illness    Social History Social History  Substance Use Topics  .  Smoking status: Never Smoker   . Smokeless tobacco: Never Used  . Alcohol Use: No     Comment: Former user 45 years ago    Review of Systems Constitutional: No fever/chills Eyes: No visual changes. ENT: No sore throat. Cardiovascular: Positive chest pain right lower chest pain/RUQ abdominal pain.  Respiratory:  Denies shortness of breath. Gastrointestinal: RUQ abdominal pain.  Some nausea, no vomiting.  No diarrhea.   Genitourinary: Negative for dysuria. Musculoskeletal: Negative for back pain. Skin: Negative for rash. Neurological: Negative for headaches, focal weakness or numbness.  10-point ROS otherwise negative.  ____________________________________________   PHYSICAL EXAM:  VITAL SIGNS: ED Triage Vitals  Enc Vitals Group     BP 06/09/16 0701 124/54 mmHg     Pulse Rate 06/09/16 0701 76     Resp 06/09/16 0701 16     Temp 06/09/16 0701 97.6 F (36.4 C)     Temp Source 06/09/16 0701 Oral     SpO2 06/09/16 0701 100 %     Weight 06/09/16 0701 178 lb (80.74 kg)     Height 06/09/16 0701 5\' 10"  (1.778 m)     Pain Score 06/09/16 0704 10    Constitutional: Alert and oriented. Appears uncomfortable.  Eyes: Conjunctivae are normal. EOMI. Head: Atraumatic. Ears:  Healthy appearing ear canals. Nose: No congestion/rhinnorhea. Mouth/Throat: Mucous membranes are moist.  Oropharynx non-erythematous. Neck: No stridor.  Cardiovascular: Normal rate, v-paced rhythm. Good peripheral circulation. Grossly normal heart sounds.   Respiratory: Normal respiratory effort.  No retractions. Lungs CTAB. Gastrointestinal: Soft with focal RUQ tenderness to palpation. No rebound or guarding. Positive Murphy's sign.  Musculoskeletal: No lower extremity tenderness nor edema. No gross deformities of extremities. Neurologic:  Normal speech and language. No gross focal neurologic deficits are appreciated.  Skin:  Skin is warm, dry and intact. Erythema to the [name] with no drainage. Warm to touch.  Psychiatric: Mood and affect are normal. Speech and behavior are normal.  ____________________________________________   LABS (all labs ordered are listed, but only abnormal results are displayed)  Labs Reviewed  CBC - Abnormal; Notable for the following:    WBC 12.1 (*)    All other components within normal limits    BASIC METABOLIC PANEL - Abnormal; Notable for the following:    Sodium 131 (*)    Chloride 90 (*)    Glucose, Bld 445 (*)    BUN 41 (*)    Creatinine, Ser 2.70 (*)    Calcium 10.6 (*)    GFR calc non Af Amer 24 (*)    GFR calc Af Amer 28 (*)    All other components within normal limits  HEPATIC FUNCTION PANEL - Abnormal; Notable for the following:    Total Protein 10.2 (*)    AST 354 (*)    ALT 177 (*)    Alkaline Phosphatase 456 (*)    Total Bilirubin 3.2 (*)    Bilirubin, Direct 1.9 (*)    Indirect Bilirubin 1.3 (*)    All other components within normal limits  LIPASE, BLOOD - Abnormal; Notable for the following:    Lipase 78 (*)    All other components within normal limits  URINALYSIS, ROUTINE W REFLEX MICROSCOPIC (NOT AT [facility])  I-STAT TROPOININ, ED   ____________________________________________  EKG  Rate: 72. V-paced rhythm.  ____________________________________________  RADIOLOGY  Dg Chest 2 View  06/09/2016  CLINICAL DATA:  Chest pain with shortness of breath for 1 day EXAM: CHEST  2 VIEW COMPARISON:  May 09, 2016 FINDINGS: There is no edema or consolidation. Heart is upper normal in size with pulmonary vascularity within normal limits. Pacemaker leads are attached to the right heart, stable. No adenopathy. No pneumothorax. There is atherosclerotic calcification in the aorta. There is degenerative change in the thoracic spine and in the shoulders bilaterally. IMPRESSION: No edema or consolidation. Stable pacemaker lead positions. Stable cardiac silhouette. Aortic atherosclerosis. Electronically Signed   By: Lowella Grip III M.D.   On: 06/09/2016 07:28   US Abdomen Limited Ruq  06/09/2016  CLINICAL DATA:  Right upper quadrant abdominal pain since midnight, known kidney stones, cirrhosis, diabetes, prostate malignancy. EXAM: US ABDOMEN LIMITED - RIGHT UPPER QUADRANT COMPARISON:  Abdominal ultrasound of May 10, 2016 FINDINGS: Gallbladder: The gallbladder is adequately  distended there is mild wall thickening to 4.3 mm. There is no positive sonographic Murphy's sign. There are echogenic mobile shadowing stones measuring up to 1.9 cm in diameter. Common bile duct: Diameter: 8.2 mm.  No intraluminal stones are observed. Liver: The hepatic echotexture is heterogeneous. The surface contour is irregular. There is no focal mass or ductal dilation. IMPRESSION: 1. Gallstones and mild gallbladder wall thickening possibly reflecting cholecystitis. No pericholecystic fluid or positive sonographic Murphy's sign. 2. Mild dilation of the common bile duct without evidence of common duct stones. 3. Cirrhotic changes within the liver, stable. Electronically Signed   By: David  Martinique M.D.   On: 06/09/2016 08:01    ____________________________________________   PROCEDURES  Procedure(s) performed:   Procedures  None ____________________________________________   INITIAL IMPRESSION / ASSESSMENT AND PLAN / ED COURSE  Pertinent labs & imaging results that were available during my care of the patient were reviewed by me and considered in my medical decision making (see chart for details).  Patient with multiple medical Cooper. He is presents to the emergency department for evaluation of severe right upper quadrant abdominal pain. During a recent admission the patient was evaluated by the surgery team for cholelithiasis with elevated LFTs. At that time no intervention was advised. They noted he may need a percutaneous chole drain if symptoms worsen. Given the patient's exam I feel this is likely causing the symptoms, however, I am also evaluating for ACS given heart history. EKG with v-paced rhythm. Following labs and RUQ [place].   9:01 AM Spoke with the surgery team who will be down for consultation. They are requesting a medicine admission given the patient's significant comorbidity. Updated patient who is having significant pain relief at this time. He is in agreement with the  plan.  9:40 AM Spoke with hospitalist who will evaluate the patient. Placed admission orders for observation and telemetry.    ____________________________________________  FINAL CLINICAL IMPRESSION(S) / ED DIAGNOSES  Final diagnoses:  RUQ abdominal pain     MEDICATIONS GIVEN DURING THIS VISIT:  Medications  morphine 4 MG/ML injection 4 mg (4 mg Intravenous Given 06/09/16 0833)  piperacillin-tazobactam (ZOSYN) IVPB 3.375 g (3.375 g Intravenous New Bag/Given 06/09/16 0906)     NEW OUTPATIENT MEDICATIONS STARTED DURING THIS VISIT:  None   Note:  This document was prepared using Dragon voice recognition software and may include unintentional dictation errors.  Nanda Quinton, MD Emergency Medicine  Margette Fast, MD 06/09/16 [phone]

## 2016-06-09 NOTE — ED Notes (Signed)
Resident at the bedside

## 2016-06-09 NOTE — Consult Note (Addendum)
Advanced Heart Failure Team Consult Note  Referring Physician:  Dr Lily Kocher PCP: Dr. Gerarda Fraction Oncologist: Dr Benay Spice.  CHF: Bensimhon  Reason for Consultation: Cardiac consideration/clearance for GI surgery.   HPI:    60 yo with history of CAD and ischemic cardiomyopathy EF 20-25%, BiV ICD upgrade 2009, chronic atrial fibrillation s/p AVN ablation 9/16, CKD, and traumatic SAH in 1/16 after a fall. He has been followed at the heart failure clinic at [facility] by Dr Carolynn Serve in the past.   Admitted 6/4 -05/12/16 with Abd/chest pain. Cardiac work up negative. ABD [place] with Cholelithiasis without acute cholecystitis and cirrhosis without ascites. Pt declined HIDA scan. No surgery planned. Plan on percutaneous drain placement as outpatient if patient were to become symptomatic. Echo 05/11/16 with LVEF 10-15%, mild AI, mild MR, normal RV. Discharge weight 188 lbs.   Seen in HF clinic 06/01/16 and was stable from HF standpoint, though he continued to have GI symptoms.  He was encouraged to move up his follow up/consideration for surgery if they were to continue or worsen.   Pt presented to [facility] this am with worsening RUQ with N/V. Denies fever, chills, or night sweats.  Weight stable. Has had some swelling in his R leg s/p presumed insect bite.  Pertinent labs on admission include Cr 2.7, K 3.8, AST 354, ALT 288, and Alk phos 456.    He denies SOB or CP.  No edema apart from [name] swelling from possible insect bite.  Pain has been worsening so reported to ED as previously directed. Now willing to proceed with HIDA scan and perc drain if necessary.   Review of Systems: [y] = yes, '[ ]'$  = no   General: Weight gain '[ ]'$ ; Weight loss '[ ]'$ ; Anorexia '[ ]'$ ; Fatigue '[ ]'$ ; Fever '[ ]'$ ; Chills '[ ]'$ ; Weakness '[ ]'$   Cardiac: Chest pain/pressure '[ ]'$ ; Resting SOB '[ ]'$ ; Exertional SOB '[ ]'$ ; Orthopnea '[ ]'$ ; Pedal Edema '[ ]'$ ; Palpitations '[ ]'$ ; Syncope '[ ]'$ ; Presyncope '[ ]'$ ; Paroxysmal nocturnal dyspnea'[ ]'$   Pulmonary: Cough '[ ]'$ ;  Wheezing'[ ]'$ ; Hemoptysis'[ ]'$ ; Sputum '[ ]'$ ; Snoring '[ ]'$   GI: Vomiting[y]; Dysphagia'[ ]'$ ; Melena'[ ]'$ ; Hematochezia '[ ]'$ ; Heartburn'[ ]'$ ; Abdominal pain [y]; Constipation '[ ]'$ ; Diarrhea '[ ]'$ ; BRBPR '[ ]'$   GU: Hematuria'[ ]'$ ; Dysuria '[ ]'$ ; Nocturia'[ ]'$   Vascular: Pain in legs with walking '[ ]'$ ; Pain in feet with lying flat '[ ]'$ ; Non-healing sores '[ ]'$ ; Stroke '[ ]'$ ; TIA '[ ]'$ ; Slurred speech '[ ]'$ ;  Neuro: Headaches'[ ]'$ ; Vertigo'[ ]'$ ; Seizures'[ ]'$ ; Paresthesias'[ ]'$ ;Blurred vision '[ ]'$ ; Diplopia '[ ]'$ ; Vision changes '[ ]'$   Ortho/Skin: Arthritis [y]; Joint pain [y]; Muscle pain '[ ]'$ ; Joint swelling '[ ]'$ ; Back Pain '[ ]'$ ; Rash '[ ]'$   Psych: Depression'[ ]'$ ; Anxiety'[ ]'$   Heme: Bleeding problems '[ ]'$ ; Clotting disorders '[ ]'$ ; Anemia '[ ]'$   Endocrine: Diabetes '[ ]'$ ; Thyroid dysfunction'[ ]'$   Home Medications Prior to Admission medications   Medication Sig Start Date End Date Taking? Authorizing Provider  apixaban (ELIQUIS) 5 MG TABS tablet Take 1 tablet (5 mg total) by mouth 2 (two) times daily. 04/26/16  Yes Jolaine Artist, MD  atorvastatin (LIPITOR) 80 MG tablet Take 1 tablet (80 mg total) by mouth daily. 09/04/15  Yes Larey Dresser, MD  co-enzyme Q-10 50 MG capsule Take 50 mg by mouth every morning.    Yes Historical Provider, MD  ferrous sulfate 325 (65 FE) MG tablet Take 1 tablet (325 mg  total) by mouth 2 (two) times daily with a meal. 07/24/15  Yes Shanker Kristeen Mans, MD  insulin glargine (LANTUS) 100 UNIT/ML injection Inject 30 Units into the skin every morning.   Yes Historical Provider, MD  metoprolol succinate (TOPROL-XL) 25 MG 24 hr tablet Take 0.5 tablets (12.5 mg total) by mouth 2 (two) times daily. 04/12/16  Yes Jolaine Artist, MD  nitroGLYCERIN (NITROLINGUAL) 0.4 MG/SPRAY spray Place 1 spray under the tongue every 5 (five) minutes x 3 doses as needed for chest pain. Reported on 06/01/2016   Yes Historical Provider, MD  pantoprazole (PROTONIX) 40 MG tablet Take 1 tablet (40 mg total) by mouth daily. 05/07/16  Yes Shirley Friar,  PA-C  potassium chloride SA (K-DUR,KLOR-CON) 20 MEQ tablet Take 20 mEq by mouth daily.   Yes Historical Provider, MD  PROAIR RESPICLICK 295 (90 BASE) MCG/ACT AEPB Inhale 2 puffs into the lungs every 6 (six) hours as needed (for breathing).  08/15/15  Yes Historical Provider, MD  torsemide (DEMADEX) 20 MG tablet Take 4 tablets by mouth every morning and 2 tables by mouth every evening 04/12/16  Yes Amy D Clegg, NP  traMADol (ULTRAM) 50 MG tablet Take 1 tablet (50 mg total) by mouth every 6 (six) hours as needed. 05/12/16  Yes Reyne Dumas, MD  metolazone (ZAROXOLYN) 2.5 MG tablet Take 2.5 mg by mouth as needed. Takes about once a  month 05/02/16   Historical Provider, MD  Omega-3 Fatty Acids (FISH OIL) 1000 MG CAPS Take 1,000 mg by mouth 2 (two) times daily.    Historical Provider, MD    Past Medical History: Past Medical History  Diagnosis Date  . Essential hypertension, benign   . CAD (coronary artery disease)     a. BMS to LAD 2001 at [facility] b. PTCA/atherectomy ramus and BMS to LAD 2009  . Chronic systolic heart failure ([place])   . GERD (gastroesophageal reflux disease)   . Paroxysmal atrial fibrillation (H[facility])     a. on amiodarone, digoxin and Eliquis  . TIA (transient ischemic attack)   . HLD (hyperlipidemia)   . Orthostatic hypotension   . Dizziness     a. chronic. Admission for this 07/18/2014  . Ischemic cardiomyopathy     EF 18% by nuclear study 2016, multiple myocardial infarctions in past    . Obesity   . Hematuria   . History of blood transfusion     "I've had 2 units so far this year" (09/27/2015)  . Anemia   . SAH (subarachnoid hemorrhage) ([place])     post-traumatic (fall) [facility] 12/2014  . PONV (postoperative nausea and vomiting)   . CHF (congestive heart failure) ([place])   . Myocardial infarction ([place]) 2001  . HCAP (healthcare-associated pneumonia) 07/21/2015  . OSA on CPAP   . Chronic kidney disease, stage IV (severe) ([place])   . DM type 2, uncontrolled, with renal complications ([place])    . Colostomy in place [facility])   . Adenocarcinoma of rectum ([place])     a. 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 Prostate cancer ([place])     a. s/p seed implants with chemo and radiation    Past Surgical History: Past Surgical History  Procedure Laterality Date  . Cardiac defibrillator placement  2002  . Abdominal and perineal resection of rectum with total mesorectal excision  10/04/2007  . Colonoscopy  09/14/2011    Dr. Gala Romney: via colostomy, Single pedunculated benign inflammatory polyp. Due for surveillance Oct 2015  . Colostomy  2008  . Colonoscopy N/A  07/02/2014    Procedure: COLONOSCOPY;  Surgeon: Daneil Dolin, MD;  [facility];  Service: Endoscopy;  Laterality: N/A;  7:30 / COLONOSCOPY THRU COLOSTOMY  . Esophagogastroduodenoscopy N/A 07/02/2014    Procedure: ESOPHAGOGASTRODUODENOSCOPY (EGD);  Surgeon: Daneil Dolin, MD;  [facility];  Service: Endoscopy;  Laterality: N/A;  7:30  . Savory dilation N/A 07/02/2014    Procedure: SAVORY DILATION;  Surgeon: Daneil Dolin, MD;  [facility];  Service: Endoscopy;  Laterality: N/A;  7:30  . Maloney dilation N/A 07/02/2014    Procedure: Venia Minks DILATION;  Surgeon: Daneil Dolin, MD;  [facility];  Service: Endoscopy;  Laterality: N/A;  7:30  . Portacath placement  06/2007    "removed ~ 1 yr later"  . Left heart catheterization with coronary angiogram N/A 07/13/2013    Procedure: LEFT HEART CATHETERIZATION WITH CORONARY ANGIOGRAM;  Surgeon: Lorretta Harp, MD;  Location: [facility] CATH LAB;  Service: Cardiovascular;  Laterality: N/A;  . Colonoscopy N/A 12/11/2014    Dr. Gala Romney via colostomy. Normal. Repeat in 2021.   Marland Kitchen Esophagogastroduodenoscopy N/A 12/11/2014    [id number] EGD  . Cardiac defibrillator placement  2009    Upgraded to a BiV ICD  . Right heart catheterization N/A 02/24/2015    Procedure: RIGHT HEART CATH;  Surgeon: Jolaine Artist, MD;  Location: [facility] CATH LAB;  Service: Cardiovascular;  Laterality: N/A;  . Ep  implantable device N/A 04/10/2015    Procedure: Ppm/Biv Ppm Generator Changeout;  Surgeon: Evans Lance, MD;  Location: [place] INVASIVE CV LAB CUPID;  Service: Cardiovascular;  Laterality: N/A;  . Bi-ventricular implantable cardioverter defibrillator  (crt-d)  2009  . Cardiac catheterization  08/2001; 2009    ; /notes 07/10/2013  . Coronary angioplasty with stent placement  2001; ~ 2006    "1 + 1"   . Givens capsule study N/A 07/23/2015    Procedure: GIVENS CAPSULE STUDY;  Surgeon: Wilford Corner, MD;  Location: [facility] ENDOSCOPY;  Service: Endoscopy;  Laterality: N/A;  . Colonoscopy N/A 08/24/2015    Procedure: COLONOSCOPY;  Surgeon: Manus Gunning, MD;  Location: [place];  Service: Gastroenterology;  Laterality: N/A;  . Electrophysiologic study N/A 08/28/2015    Procedure: AV Node Ablation;  Surgeon: Will Meredith Leeds, MD;  Location: [place] CV LAB;  Service: Cardiovascular;  Laterality: N/A;  . Av fistula placement Right 09/16/2015    Procedure: ARTERIOVENOUS (AV) FISTULA CREATION - BRACHIOCEPHALIC;  Surgeon: Elam Dutch, MD;  Location: [facility] OR;  Service: Vascular;  Laterality: Right;    Family History: Family History  Problem Relation Age of Onset  . Colon cancer Mother 62  . Colon cancer Sister 79  . Coronary artery disease Father   . Colon cancer Other     2 cousins, succumbed to illness    Social History: Social History   Social History  . Marital Status: Married    Spouse Name: N/A  . Number of Children: 1  . Years of Education: N/A   Occupational History  .     Social History Main Topics  . Smoking status: Never Smoker   . Smokeless tobacco: Never Used  . Alcohol Use: No     Comment: Former user 45 years ago  . Drug Use: No  . Sexual Activity: No   Other Topics Concern  . None   Social History Narrative   Married 57 years   1 son, 2 grandkids    Denies  alcohol, drugs, tobacco     Allergies:  No Known Allergies  Objective:    Vital  Signs:   Temp:  [97.6 F (36.4 C)-98.7 F (37.1 C)] 98.7 F (37.1 C) (07/05 1036) Pulse Rate:  [69-76] 69 (07/05 1036) Resp:  [11-24] 18 (07/05 1036) BP: (104-139)/(48-67) 118/48 mmHg (07/05 1036) SpO2:  [98 %-100 %] 100 % (07/05 1036) Weight:  [178 lb (80.74 kg)-181 lb 6.4 oz (82.283 kg)] 181 lb 6.4 oz (82.283 kg) (07/05 1036)    Weight change: Filed Weights   06/09/16 0701 06/09/16 1036  Weight: 178 lb (80.74 kg) 181 lb 6.4 oz (82.283 kg)    Intake/Output:  No intake or output data in the 24 hours ending 06/09/16 1106   Physical Exam: General: NAD, HEENT: Normal.  Neck: JVP 6-7 cm. no thyromegaly or thyroid nodule.  Lungs: Clear, normal effort.  CV: Nondisplaced PMI. Regular S1S2, 2/6 SEM RUSB. No peripheral edema. No carotid bruit. Normal pedal pulses.  Abdomen: Soft, RUQ tenderness with some guarding,  ND, no HSM appreciated. S/p colostomy.  Skin: Intact without lesions or rashes.  Neurologic: Alert and oriented x 3.  Psych: Normal affect. Extremities: No clubbing or cyanosis. [name] with ? Vasculitic/cellulitic changes with no obvious lesion.  Appears to be in stages of healing.   Telemetry: Reviewed personally, Vpaced rhythm in 70s.  Labs: Basic Metabolic Panel:  Recent Labs Lab 06/09/16 0759  NA 131*  K 3.8  CL 90*  CO2 26  GLUCOSE 445*  BUN 41*  CREATININE 2.70*  CALCIUM 10.6*    Liver Function Tests:  Recent Labs Lab 06/09/16 0759  AST 354*  ALT 177*  ALKPHOS 456*  BILITOT 3.2*  PROT 10.2*  ALBUMIN 3.5    Recent Labs Lab 06/09/16 0759  LIPASE 78*   No results for input(s): AMMONIA in the last 168 hours.  CBC:  Recent Labs Lab 06/09/16 0715  WBC 12.1*  HGB 15.4  HCT 45.4  MCV 95.8  PLT 206    Cardiac Enzymes: No results for input(s): CKTOTAL, CKMB, CKMBINDEX, TROPONINI in the last 168 hours.  BNP: BNP (last 3 results)  Recent Labs  09/26/15 1820 05/09/16 2343 06/01/16 1200  BNP 1393.4* 377.6* 1016.7*     ProBNP (last 3 results) No results for input(s): PROBNP in the last 8760 hours.   CBG: No results for input(s): GLUCAP in the last 168 hours.  Coagulation Studies: No results for input(s): LABPROT, INR in the last 72 hours.  Other results: EKG: 06/09/16 V-paced 72 bpm  Imaging: Dg Chest 2 View  06/09/2016  CLINICAL DATA:  Chest pain with shortness of breath for 1 day EXAM: CHEST  2 VIEW COMPARISON:  May 09, 2016 FINDINGS: There is no edema or consolidation. Heart is upper normal in size with pulmonary vascularity within normal limits. Pacemaker leads are attached to the right heart, stable. No adenopathy. No pneumothorax. There is atherosclerotic calcification in the aorta. There is degenerative change in the thoracic spine and in the shoulders bilaterally. IMPRESSION: No edema or consolidation. Stable pacemaker lead positions. Stable cardiac silhouette. Aortic atherosclerosis. Electronically Signed   By: Lowella Grip III M.D.   On: 06/09/2016 07:28   US Abdomen Limited Ruq  06/09/2016  CLINICAL DATA:  Right upper quadrant abdominal pain since midnight, known kidney stones, cirrhosis, diabetes, prostate malignancy. EXAM: US ABDOMEN LIMITED - RIGHT UPPER QUADRANT COMPARISON:  Abdominal ultrasound of May 10, 2016 FINDINGS: Gallbladder: The gallbladder is adequately distended there is mild wall thickening  to 4.3 mm. There is no positive sonographic Murphy's sign. There are echogenic mobile shadowing stones measuring up to 1.9 cm in diameter. Common bile duct: Diameter: 8.2 mm.  No intraluminal stones are observed. Liver: The hepatic echotexture is heterogeneous. The surface contour is irregular. There is no focal mass or ductal dilation. IMPRESSION: 1. Gallstones and mild gallbladder wall thickening possibly reflecting cholecystitis. No pericholecystic fluid or positive sonographic Murphy's sign. 2. Mild dilation of the common bile duct without evidence of common duct stones. 3. Cirrhotic  changes within the liver, stable. Electronically Signed   By: David  Martinique M.D.   On: 06/09/2016 08:01      Medications:     Current Medications: . atorvastatin  80 mg Oral Daily  . ferrous sulfate  325 mg Oral BID WC  . insulin aspart  0-15 Units Subcutaneous TID WC  . insulin glargine  15 Units Subcutaneous BID  . metoprolol succinate  12.5 mg Oral BID  . pantoprazole  40 mg Oral Daily  . potassium chloride SA  20 mEq Oral Daily  . sodium chloride flush  3 mL Intravenous Q12H  . torsemide  80 mg Oral q morning - 10a   And  . torsemide  40 mg Oral QPM     Infusions:      Assessment   1. Acute on chronic cholecystitis 2. Chronic systolic CHF - ICM - Echo 05/11/16 with LVEF 10-15%, mild AI, mild MR, normal RV.  3. Chronic Afib with CHADS2VASC score of 5. S/p AV node ablation 4. CAD s/p NSTEMI in 04/20/16 5. CKD stage IV s/p AVF in RUE 6. h/o rectal cancer 2008: Has diverting colostomy. He has follow up with Dr Benay Spice. - Recent CT (05/02/16) with no evidence of recurrence. 7. HLD.   Plan    As previously discussed pt would be high risk for surgery from cardiac perspective given low EF and active cholecystitis.     HIDA scan ordered for today.   We would prefer placement of perc drain and consideration of cholecystectomy once stable in weeks to months.   He is stable from a HF standpoint. OK to hold eliquis for consideration for procedure.  We will follow along and assist in adjustment of HF meds as needed.   Length of Stay:   Shirley Friar PA-C 06/09/2016, 11:06 AM  Advanced Heart Failure Team Pager [phone] (M-F; 7a - 4p)  Please contact [facility] for night-coverage after hours (4p -7a ) and weekends on [email or web address]  Patient seen and examined with Oda Kilts, PA-C. We discussed all aspects of the encounter. I agree with the assessment and plan as stated above.   HF status currently stable. However his EF is low and he has been quite tenuous  in the past. He would be moderate to high-risk for surgical cholecystectomy particularly in the throes of an acute attack. Agree with Dr. Ninfa Linden that we would favor a perc drain if possible with the potential for lap chole down the road when he is over the current attack. GI suggesting possible ERCP which would add to the risk but if he needs it for treatment then we are OK with pursuing with careful peri-operative management. We will follow closely.   Hold Eliquis for AF. If more than 48 hours off can consider bridging with heparin as needed.   Bensimhon, Daniel,MD 4:12 PM

## 2016-06-09 NOTE — Progress Notes (Signed)
Patient refuses to wear hospital CPAP HS. He states he does not wear a home machine any longer. The patient states he had lost 90lbs. No distress noted. Nurse aware.

## 2016-06-09 NOTE — Progress Notes (Signed)
CRITICAL VALUE ALERT  Critical value received:  Troponin 0.04  Date of notification:  06/09/16  Time of notification:  1150  Critical value read back:Yes.    Nurse who received alert:  Waynetta Sandy, RN  MD notified (1st page):  Dr. Eulas Post  Time of first page:  1155  MD notified (2nd page):  Time of second page:  Responding MD:  Dr. Eulas Post  Time MD responded:  [phone]

## 2016-06-10 ENCOUNTER — Inpatient Hospital Stay (HOSPITAL_COMMUNITY): Payer: Medicare Other

## 2016-06-10 DIAGNOSIS — N186 End stage renal disease: Secondary | ICD-10-CM | POA: Diagnosis not present

## 2016-06-10 DIAGNOSIS — E785 Hyperlipidemia, unspecified: Secondary | ICD-10-CM

## 2016-06-10 DIAGNOSIS — E872 Acidosis: Secondary | ICD-10-CM | POA: Diagnosis present

## 2016-06-10 DIAGNOSIS — L03115 Cellulitis of right lower limb: Secondary | ICD-10-CM | POA: Diagnosis present

## 2016-06-10 DIAGNOSIS — K851 Biliary acute pancreatitis without necrosis or infection: Secondary | ICD-10-CM | POA: Diagnosis present

## 2016-06-10 DIAGNOSIS — Z95810 Presence of automatic (implantable) cardiac defibrillator: Secondary | ICD-10-CM | POA: Diagnosis not present

## 2016-06-10 DIAGNOSIS — Z992 Dependence on renal dialysis: Secondary | ICD-10-CM | POA: Diagnosis not present

## 2016-06-10 DIAGNOSIS — K810 Acute cholecystitis: Secondary | ICD-10-CM | POA: Diagnosis not present

## 2016-06-10 DIAGNOSIS — G4733 Obstructive sleep apnea (adult) (pediatric): Secondary | ICD-10-CM | POA: Diagnosis present

## 2016-06-10 DIAGNOSIS — R740 Nonspecific elevation of levels of transaminase and lactic acid dehydrogenase [LDH]: Secondary | ICD-10-CM | POA: Diagnosis not present

## 2016-06-10 DIAGNOSIS — W57XXXA Bitten or stung by nonvenomous insect and other nonvenomous arthropods, initial encounter: Secondary | ICD-10-CM | POA: Diagnosis present

## 2016-06-10 DIAGNOSIS — E1165 Type 2 diabetes mellitus with hyperglycemia: Secondary | ICD-10-CM | POA: Diagnosis present

## 2016-06-10 DIAGNOSIS — D696 Thrombocytopenia, unspecified: Secondary | ICD-10-CM | POA: Diagnosis present

## 2016-06-10 DIAGNOSIS — K819 Cholecystitis, unspecified: Secondary | ICD-10-CM | POA: Diagnosis not present

## 2016-06-10 DIAGNOSIS — J449 Chronic obstructive pulmonary disease, unspecified: Secondary | ICD-10-CM | POA: Diagnosis present

## 2016-06-10 DIAGNOSIS — K8020 Calculus of gallbladder without cholecystitis without obstruction: Secondary | ICD-10-CM | POA: Diagnosis not present

## 2016-06-10 DIAGNOSIS — I5023 Acute on chronic systolic (congestive) heart failure: Secondary | ICD-10-CM | POA: Diagnosis not present

## 2016-06-10 DIAGNOSIS — I2510 Atherosclerotic heart disease of native coronary artery without angina pectoris: Secondary | ICD-10-CM | POA: Diagnosis not present

## 2016-06-10 DIAGNOSIS — R1011 Right upper quadrant pain: Secondary | ICD-10-CM | POA: Diagnosis present

## 2016-06-10 DIAGNOSIS — N189 Chronic kidney disease, unspecified: Secondary | ICD-10-CM | POA: Diagnosis not present

## 2016-06-10 DIAGNOSIS — Z794 Long term (current) use of insulin: Secondary | ICD-10-CM | POA: Diagnosis not present

## 2016-06-10 DIAGNOSIS — K8011 Calculus of gallbladder with chronic cholecystitis with obstruction: Secondary | ICD-10-CM | POA: Diagnosis present

## 2016-06-10 DIAGNOSIS — I120 Hypertensive chronic kidney disease with stage 5 chronic kidney disease or end stage renal disease: Secondary | ICD-10-CM | POA: Diagnosis not present

## 2016-06-10 DIAGNOSIS — I482 Chronic atrial fibrillation: Secondary | ICD-10-CM | POA: Diagnosis present

## 2016-06-10 DIAGNOSIS — N170 Acute kidney failure with tubular necrosis: Secondary | ICD-10-CM | POA: Diagnosis present

## 2016-06-10 DIAGNOSIS — E1129 Type 2 diabetes mellitus with other diabetic kidney complication: Secondary | ICD-10-CM | POA: Diagnosis not present

## 2016-06-10 DIAGNOSIS — K8050 Calculus of bile duct without cholangitis or cholecystitis without obstruction: Secondary | ICD-10-CM | POA: Diagnosis present

## 2016-06-10 DIAGNOSIS — D631 Anemia in chronic kidney disease: Secondary | ICD-10-CM | POA: Diagnosis not present

## 2016-06-10 DIAGNOSIS — D649 Anemia, unspecified: Secondary | ICD-10-CM | POA: Diagnosis present

## 2016-06-10 DIAGNOSIS — I252 Old myocardial infarction: Secondary | ICD-10-CM | POA: Diagnosis not present

## 2016-06-10 DIAGNOSIS — Z9049 Acquired absence of other specified parts of digestive tract: Secondary | ICD-10-CM | POA: Diagnosis not present

## 2016-06-10 DIAGNOSIS — Z85048 Personal history of other malignant neoplasm of rectum, rectosigmoid junction, and anus: Secondary | ICD-10-CM | POA: Diagnosis not present

## 2016-06-10 DIAGNOSIS — E871 Hypo-osmolality and hyponatremia: Secondary | ICD-10-CM | POA: Diagnosis present

## 2016-06-10 DIAGNOSIS — Z79899 Other long term (current) drug therapy: Secondary | ICD-10-CM | POA: Diagnosis not present

## 2016-06-10 DIAGNOSIS — K811 Chronic cholecystitis: Secondary | ICD-10-CM | POA: Diagnosis not present

## 2016-06-10 DIAGNOSIS — N184 Chronic kidney disease, stage 4 (severe): Secondary | ICD-10-CM | POA: Diagnosis present

## 2016-06-10 DIAGNOSIS — I130 Hypertensive heart and chronic kidney disease with heart failure and stage 1 through stage 4 chronic kidney disease, or unspecified chronic kidney disease: Secondary | ICD-10-CM | POA: Diagnosis present

## 2016-06-10 DIAGNOSIS — K316 Fistula of stomach and duodenum: Secondary | ICD-10-CM | POA: Diagnosis present

## 2016-06-10 DIAGNOSIS — Z933 Colostomy status: Secondary | ICD-10-CM | POA: Diagnosis not present

## 2016-06-10 DIAGNOSIS — K831 Obstruction of bile duct: Secondary | ICD-10-CM | POA: Diagnosis not present

## 2016-06-10 DIAGNOSIS — Z8546 Personal history of malignant neoplasm of prostate: Secondary | ICD-10-CM | POA: Diagnosis not present

## 2016-06-10 DIAGNOSIS — I5022 Chronic systolic (congestive) heart failure: Secondary | ICD-10-CM | POA: Diagnosis not present

## 2016-06-10 DIAGNOSIS — K838 Other specified diseases of biliary tract: Secondary | ICD-10-CM | POA: Diagnosis not present

## 2016-06-10 DIAGNOSIS — Z8673 Personal history of transient ischemic attack (TIA), and cerebral infarction without residual deficits: Secondary | ICD-10-CM | POA: Diagnosis not present

## 2016-06-10 DIAGNOSIS — E669 Obesity, unspecified: Secondary | ICD-10-CM | POA: Diagnosis present

## 2016-06-10 DIAGNOSIS — A419 Sepsis, unspecified organism: Secondary | ICD-10-CM | POA: Diagnosis not present

## 2016-06-10 DIAGNOSIS — I480 Paroxysmal atrial fibrillation: Secondary | ICD-10-CM | POA: Diagnosis not present

## 2016-06-10 DIAGNOSIS — N179 Acute kidney failure, unspecified: Secondary | ICD-10-CM | POA: Diagnosis not present

## 2016-06-10 DIAGNOSIS — R1012 Left upper quadrant pain: Secondary | ICD-10-CM | POA: Diagnosis not present

## 2016-06-10 DIAGNOSIS — Z7901 Long term (current) use of anticoagulants: Secondary | ICD-10-CM | POA: Diagnosis not present

## 2016-06-10 DIAGNOSIS — I255 Ischemic cardiomyopathy: Secondary | ICD-10-CM | POA: Diagnosis not present

## 2016-06-10 DIAGNOSIS — I5043 Acute on chronic combined systolic (congestive) and diastolic (congestive) heart failure: Secondary | ICD-10-CM | POA: Diagnosis present

## 2016-06-10 DIAGNOSIS — D638 Anemia in other chronic diseases classified elsewhere: Secondary | ICD-10-CM | POA: Diagnosis not present

## 2016-06-10 DIAGNOSIS — K219 Gastro-esophageal reflux disease without esophagitis: Secondary | ICD-10-CM | POA: Diagnosis present

## 2016-06-10 DIAGNOSIS — I129 Hypertensive chronic kidney disease with stage 1 through stage 4 chronic kidney disease, or unspecified chronic kidney disease: Secondary | ICD-10-CM | POA: Diagnosis not present

## 2016-06-10 DIAGNOSIS — I951 Orthostatic hypotension: Secondary | ICD-10-CM

## 2016-06-10 DIAGNOSIS — K824 Cholesterolosis of gallbladder: Secondary | ICD-10-CM | POA: Diagnosis present

## 2016-06-10 DIAGNOSIS — M7989 Other specified soft tissue disorders: Secondary | ICD-10-CM | POA: Diagnosis present

## 2016-06-10 DIAGNOSIS — E1122 Type 2 diabetes mellitus with diabetic chronic kidney disease: Secondary | ICD-10-CM | POA: Diagnosis not present

## 2016-06-10 LAB — PROTIME-INR
INR: 1.6 — AB (ref 0.00–1.49)
PROTHROMBIN TIME: 19.1 s — AB (ref 11.6–15.2)

## 2016-06-10 LAB — GLUCOSE, CAPILLARY
GLUCOSE-CAPILLARY: 139 mg/dL — AB (ref 65–99)
GLUCOSE-CAPILLARY: 167 mg/dL — AB (ref 65–99)
GLUCOSE-CAPILLARY: 176 mg/dL — AB (ref 65–99)
GLUCOSE-CAPILLARY: 178 mg/dL — AB (ref 65–99)
GLUCOSE-CAPILLARY: 206 mg/dL — AB (ref 65–99)
Glucose-Capillary: 131 mg/dL — ABNORMAL HIGH (ref 65–99)
Glucose-Capillary: 115 mg/dL — ABNORMAL HIGH (ref 65–99)

## 2016-06-10 LAB — COMPREHENSIVE METABOLIC PANEL
ALBUMIN: 2.5 g/dL — AB (ref 3.5–5.0)
ALK PHOS: 302 U/L — AB (ref 38–126)
ALT: 206 U/L — ABNORMAL HIGH (ref 17–63)
ANION GAP: 9 (ref 5–15)
AST: 272 U/L — ABNORMAL HIGH (ref 15–41)
BILIRUBIN TOTAL: 6.4 mg/dL — AB (ref 0.3–1.2)
BUN: 41 mg/dL — ABNORMAL HIGH (ref 6–20)
CALCIUM: 10.2 mg/dL (ref 8.9–10.3)
CO2: 31 mmol/L (ref 22–32)
Chloride: 94 mmol/L — ABNORMAL LOW (ref 101–111)
Creatinine, Ser: 2.88 mg/dL — ABNORMAL HIGH (ref 0.61–1.24)
GFR calc non Af Amer: 22 mL/min — ABNORMAL LOW (ref >60)
GFR, EST AFRICAN AMERICAN: 26 mL/min — AB (ref >60)
GLUCOSE: 188 mg/dL — AB (ref 65–99)
POTASSIUM: 4 mmol/L (ref 3.5–5.1)
SODIUM: 134 mmol/L — AB (ref 135–145)
TOTAL PROTEIN: 7.7 g/dL (ref 6.5–8.1)

## 2016-06-10 LAB — CBC
HEMATOCRIT: 40 % (ref 39.0–52.0)
HEMOGLOBIN: 13.5 g/dL (ref 13.0–17.0)
MCH: 31.6 pg (ref 26.0–34.0)
MCHC: 33.8 g/dL (ref 30.0–36.0)
MCV: 93.7 fL (ref 78.0–100.0)
Platelets: 161 10*3/uL (ref 150–400)
RBC: 4.27 MIL/uL (ref 4.22–5.81)
RDW: 12.5 % (ref 11.5–15.5)
WBC: 12.4 10*3/uL — ABNORMAL HIGH (ref 4.0–10.5)

## 2016-06-10 LAB — HEMOGLOBIN A1C
HEMOGLOBIN A1C: 11.2 % — AB (ref 4.8–5.6)
Mean Plasma Glucose: 275 mg/dL

## 2016-06-10 LAB — MAGNESIUM: Magnesium: 2.2 mg/dL (ref 1.7–2.4)

## 2016-06-10 LAB — APTT: APTT: 32 s (ref 24–37)

## 2016-06-10 MED ORDER — VANCOMYCIN HCL IN DEXTROSE 750-5 MG/150ML-% IV SOLN
750.0000 mg | INTRAVENOUS | Status: DC
  Administered 2016-06-10 – 2016-06-12 (×3): 750 mg via INTRAVENOUS
  Filled 2016-06-10 (×4): qty 150

## 2016-06-10 NOTE — Progress Notes (Signed)
Patient ID: [name], male   DOB: 19-Feb-1956, 60 y.o.   MRN: [id number] Dr. Haroldine Laws called me to re-evaluate [name] due to increased abdominal pain. On my arrival, he was sleeping. I woke him and he did endorse increased epigastric pain a little while ago that has subsided somewhat. On exam his abdomen is soft, no RUQ tenderness, mild epigastric tenderness. He is afebrile with normal vitals. CBC and CMET are pending now. I will order a repeat U/S to evaluate for biliary dilatation. He may require an ERCP. Dr. Amedeo Plenty from GI is also following him. I also spoke with Dr. Ninfa Linden from our service who saw him earlier today.  Georganna Skeans, MD, MPH, FACS Trauma: [phone] General Surgery: [phone]

## 2016-06-10 NOTE — Progress Notes (Signed)
Pt refuses cpap tonight will monotor

## 2016-06-10 NOTE — Progress Notes (Signed)
Dr. Haroldine Laws rounded on [name], [phone] and made call to Dr. Amedeo Plenty, GI and requested Surgical group be called to follow up with patient.  Dr. Amedeo Plenty called back and stated he had called Dr. Biagio Borg number listed in [place] and got a disconnected message for that number and that he called Dr. Unknown Jim and left voice message per Dr. Clayborne Dana request. I informed [name], patient's RN of above and she paged Dr. Grandville Silos at [phone] per Amion listing and Dr. Grandville Silos returned page.  Dr. Grandville Silos returned page to me and I informed him of Dr. Clayborne Dana request for follow-up.  Dr. Grandville Silos stated he will contact Dr. Haroldine Laws to discuss plan of care for [name].

## 2016-06-10 NOTE — Progress Notes (Signed)
[facility] Progress Note  Subjective: Currently pain-free  Objective: Vital signs in last 24 hours: Temp:  [97.7 F (36.5 C)-98.7 F (37.1 C)] 97.7 F (36.5 C) (07/06 0755) Pulse Rate:  [69-75] 71 (07/06 0755) Resp:  [13-20] 18 (07/06 0755) BP: (94-124)/(48-60) 100/56 mmHg (07/06 0755) SpO2:  [95 %-100 %] 100 % (07/06 0755) Weight:  [81.693 kg (180 lb 1.6 oz)-82.283 kg (181 lb 6.4 oz)] 81.693 kg (180 lb 1.6 oz) (07/06 0425) Weight change:    PE: Abdomen soft, exam unchanged.  Lab Results: Results for orders placed or performed during the hospital encounter of 06/09/16 (from the past 24 hour(s))  Urinalysis, Routine w reflex microscopic     Status: Abnormal   Collection Time: 06/09/16 10:23 AM  Result Value Ref Range   Color, Urine YELLOW YELLOW   APPearance CLEAR CLEAR   Specific Gravity, Urine 1.017 1.005 - 1.030   pH 5.5 5.0 - 8.0   Glucose, UA >1000 (A) NEGATIVE mg/dL   Hgb urine dipstick NEGATIVE NEGATIVE   Bilirubin Urine NEGATIVE NEGATIVE   Ketones, ur NEGATIVE NEGATIVE mg/dL   Protein, ur NEGATIVE NEGATIVE mg/dL   Nitrite NEGATIVE NEGATIVE   Leukocytes, UA NEGATIVE NEGATIVE  Urine microscopic-add on     Status: Abnormal   Collection Time: 06/09/16 10:23 AM  Result Value Ref Range   Squamous Epithelial / LPF 0-5 (A) NONE SEEN   WBC, UA 0-5 0 - 5 WBC/hpf   RBC / HPF 0-5 0 - 5 RBC/hpf   Bacteria, UA RARE (A) NONE SEEN  Troponin I     Status: Abnormal   Collection Time: 06/09/16 10:41 AM  Result Value Ref Range   Troponin I 0.04 (HH) <0.03 ng/mL  Protime-INR     Status: Abnormal   Collection Time: 06/09/16 10:41 AM  Result Value Ref Range   Prothrombin Time 16.1 (H) 11.6 - 15.2 seconds   INR 1.27 0.00 - 1.49  Glucose, capillary     Status: Abnormal   Collection Time: 06/09/16 11:36 AM  Result Value Ref Range   Glucose-Capillary 385 (H) 65 - 99 mg/dL   Comment 1 Notify RN   Glucose, capillary     Status: Abnormal   Collection Time: 06/09/16   5:56 PM  Result Value Ref Range   Glucose-Capillary 280 (H) 65 - 99 mg/dL  Troponin I     Status: Abnormal   Collection Time: 06/09/16  5:57 PM  Result Value Ref Range   Troponin I 0.04 (HH) <0.03 ng/mL  Glucose, capillary     Status: Abnormal   Collection Time: 06/09/16  8:13 PM  Result Value Ref Range   Glucose-Capillary 216 (H) 65 - 99 mg/dL   Comment 1 Notify RN    Comment 2 Document in Chart   Troponin I     Status: Abnormal   Collection Time: 06/09/16  9:53 PM  Result Value Ref Range   Troponin I 0.05 (HH) <0.03 ng/mL  Glucose, capillary     Status: Abnormal   Collection Time: 06/10/16 12:15 AM  Result Value Ref Range   Glucose-Capillary 206 (H) 65 - 99 mg/dL   Comment 1 Notify RN    Comment 2 Document in Chart   Comprehensive metabolic panel     Status: Abnormal   Collection Time: 06/10/16  2:46 AM  Result Value Ref Range   Sodium 134 (L) 135 - 145 mmol/L   Potassium 4.0 3.5 - 5.1 mmol/L   Chloride 94 (L) 101 - 111  mmol/L   CO2 31 22 - 32 mmol/L   Glucose, Bld 188 (H) 65 - 99 mg/dL   BUN 41 (H) 6 - 20 mg/dL   Creatinine, Ser 2.88 (H) 0.61 - 1.24 mg/dL   Calcium 10.2 8.9 - 10.3 mg/dL   Total Protein 7.7 6.5 - 8.1 g/dL   Albumin 2.5 (L) 3.5 - 5.0 g/dL   AST 272 (H) 15 - 41 U/L   ALT 206 (H) 17 - 63 U/L   Alkaline Phosphatase 302 (H) 38 - 126 U/L   Total Bilirubin 6.4 (H) 0.3 - 1.2 mg/dL   GFR calc non Af Amer 22 (L) >60 mL/min   GFR calc Af Amer 26 (L) >60 mL/min   Anion gap 9 5 - 15  CBC     Status: Abnormal   Collection Time: 06/10/16  2:46 AM  Result Value Ref Range   WBC 12.4 (H) 4.0 - 10.5 K/uL   RBC 4.27 4.22 - 5.81 MIL/uL   Hemoglobin 13.5 13.0 - 17.0 g/dL   HCT 40.0 39.0 - 52.0 %   MCV 93.7 78.0 - 100.0 fL   MCH 31.6 26.0 - 34.0 pg   MCHC 33.8 30.0 - 36.0 g/dL   RDW 12.5 11.5 - 15.5 %   Platelets 161 150 - 400 K/uL  Protime-INR     Status: Abnormal   Collection Time: 06/10/16  2:46 AM  Result Value Ref Range   Prothrombin Time 19.1 (H) 11.6 -  15.2 seconds   INR 1.60 (H) 0.00 - 1.49  APTT     Status: None   Collection Time: 06/10/16  2:46 AM  Result Value Ref Range   aPTT 32 24 - 37 seconds  Magnesium     Status: None   Collection Time: 06/10/16  2:46 AM  Result Value Ref Range   Magnesium 2.2 1.7 - 2.4 mg/dL  Glucose, capillary     Status: Abnormal   Collection Time: 06/10/16  4:06 AM  Result Value Ref Range   Glucose-Capillary 167 (H) 65 - 99 mg/dL   Comment 1 Notify RN    Comment 2 Document in Chart   Glucose, capillary     Status: Abnormal   Collection Time: 06/10/16  7:53 AM  Result Value Ref Range   Glucose-Capillary 178 (H) 65 - 99 mg/dL   Comment 1 Notify RN     Studies/Results: Dg Chest 2 View  06/09/2016  CLINICAL DATA:  Chest pain with shortness of breath for 1 day EXAM: CHEST  2 VIEW COMPARISON:  May 09, 2016 FINDINGS: There is no edema or consolidation. Heart is upper normal in size with pulmonary vascularity within normal limits. Pacemaker leads are attached to the right heart, stable. No adenopathy. No pneumothorax. There is atherosclerotic calcification in the aorta. There is degenerative change in the thoracic spine and in the shoulders bilaterally. IMPRESSION: No edema or consolidation. Stable pacemaker lead positions. Stable cardiac silhouette. Aortic atherosclerosis. Electronically Signed   By: Lowella Grip III M.D.   On: 06/09/2016 07:28   Nm Hepato W/eject Fract  06/09/2016  CLINICAL DATA:  Right upper quadrant pain. Nausea, vomiting. Cholelithiasis. Elevated LFTs. EXAM: NUCLEAR MEDICINE HEPATOBILIARY IMAGING TECHNIQUE: Sequential images of the abdomen were obtained out to 60 minutes following intravenous administration of radiopharmaceutical. RADIOPHARMACEUTICALS:  5.1 mCi Tc-21m  Choletec IV COMPARISON:  Ultrasound performed today. Ultrasound 05/10/2016. CT 05/05/2016. FINDINGS: There is uptake of radiotracer by the liver. This is somewhat delayed as there is still some activity  seen within the heart  out to 2 hours. There is no excretion of radiotracer by the liver. Nonvisualization of the biliary system out to 2 hours. IMPRESSION: Somewhat delayed hepatic uptake of radiotracer. No hepatic excretion of radiotracer. Burtis Junes this is related to hepatic dysfunction, possibly hepatitis. Cannot assess gallbladder filling without biliary excretion. Given the findings on recent ultrasound, recommend further evaluation of the biliary system with MRI and MRCP. Electronically Signed   By: Rolm Baptise M.D.   On: 06/09/2016 16:54   US Abdomen Limited Ruq  06/09/2016  CLINICAL DATA:  Right upper quadrant abdominal pain since midnight, known kidney stones, cirrhosis, diabetes, prostate malignancy. EXAM: US ABDOMEN LIMITED - RIGHT UPPER QUADRANT COMPARISON:  Abdominal ultrasound of May 10, 2016 FINDINGS: Gallbladder: The gallbladder is adequately distended there is mild wall thickening to 4.3 mm. There is no positive sonographic Murphy's sign. There are echogenic mobile shadowing stones measuring up to 1.9 cm in diameter. Common bile duct: Diameter: 8.2 mm.  No intraluminal stones are observed. Liver: The hepatic echotexture is heterogeneous. The surface contour is irregular. There is no focal mass or ductal dilation. IMPRESSION: 1. Gallstones and mild gallbladder wall thickening possibly reflecting cholecystitis. No pericholecystic fluid or positive sonographic Murphy's sign. 2. Mild dilation of the common bile duct without evidence of common duct stones. 3. Cirrhotic changes within the liver, stable. Electronically Signed   By: David  Martinique M.D.   On: 06/09/2016 08:01      Assessment: Cholecystitis likely, plus minus choledocholithiasis. Unable to clarify relative contributions to presentation a stone nondiagnostic PIPIDA scan unfortunately cannot do MRCP due to ICD . Plan: Discussed case with Dr. Ninfa Linden, he will speak to interventional radiology about doing a percutaneous cholecystostomy, and hopefully we will be  able to obtain a cholangiogram as well. Other options discussed included endoscopic ultrasound with ERCP reserved for positive findings. Will continue to follow with you.    Dracen Reigle C 06/10/2016, 9:37 AM  Pager [phone] If no answer or after 5 PM call [phone]

## 2016-06-10 NOTE — Progress Notes (Signed)
PROGRESS NOTE  [name]  JSH:[id number] DOB: 1956/05/04 DOA: 06/09/2016 PCP: Glo Herring., MD Outpatient Specialists:  Subjective: Does not have any pain this morning he feels much better.  Brief Narrative:  RUQ abdominal pain, has history of severe ischemic cardiomyopathy and rectal cancer status post partial colectomy and colostomy.  Assessment & Plan:   Active Problems:   History of rectal cancer   Ischemic cardiomyopathy-EF 35% 04/24/15 echo   CAD with LCX disease and stents to LAD   Chronic systolic heart failure (HCC)   Paroxysmal atrial fibrillation ([place])   DM type 2, uncontrolled, with renal complications (HCC)   Gallbladder polyp   GERD (gastroesophageal reflux disease)   Orthostatic hypotension   OSA on CPAP   Hyperlipidemia   Transaminitis   Anemia of chronic disease   Right heart failure (HCC)   Cholecystitis    Cholelithiasis/cholecystitis. Patient readmitted with same issues 6/5, not a surgical candidate at the time due to myriad of cardiac issues . Abd [place] today shows Gallstones and mild gallbladder wall thickening possibly reflecting cholecystitis. No pericholecystic fluid or positive sonographic Murphy's sign. Mild dilation of the common bile duct without evidence of commonduct stones. Total bilirubin 3.2. AST 354 ALT 177 lipase 78.  GI consult for evaluation of elevated LFT and CBD dilatation on Ultrasound  -HIDA scan done and showed indeterminate results secondary to lack of gallbladder filling. -General surgery and GI seems to agree that patient needs percutaneous drainage, will follow. -Previous CT scan showed evidence of cirrhosis of the liver.  Atrial Fibrillation CHA2DS2-VASc score 3, on anticoagulation with Eliquis Rate controlled  -Continue home meds  Hold Eliquis anticipating possible surgery. If No a candidate for procedure, will resume anticoagulant  Chronic diastolic heart failure, last echocardiogram 6/6 with Severely  dilated cavity, mild LVH, EF 10-15 % Dry weight 82 kg, current weight 82.2 . Appears compensated.  - Careful use of IVF - Daily weights, strict I/O  Chronic kidney disease stage IV baseline creatinine 2.6, currently at 2.7  No acute changes.  HAs a R AVF if needed for HD   CAD, Tn 0.01 EKG Shows paced rhythm , last cardiac catheterization with stent in 2015, patient is cardiac pain free at this time. Continue meds   History of Hypotension stable for now BP 104/55 mmHg  Pulse 75  IVF on hold in the setting of severe CHF /Afib with risk of volume overload  Continue to monitor   Type II Diabetes Current blood sugar level is Glucose 445, Anion Gap 15, corrected 16.25. Hold home oral diabetic medications.  Lantus 1/2 dose bid, SSI (moderate) Accucheck q 4 h NPO for now, can advance to carb modified diet when ok with Surgery.  OSA on CPAP/COPD, not on acute exacerbation, stable  Continue CPAP and O2  Leukocytosis, likely reactive due to cholecystitis, pain, inflammation versus underlying infection versus possible cellulitic changes seen on exam on the R lower extremity. WBC 12.1 CXR without acute disease. Afebrile  Plays and Zosyn.   GERD, no acute symptoms: Continue PPI  History of Rectal Cancer, s/p chemo/ XRT No acute changes. Can follow as OP  Consult to Left colostomy care   DVT prophylaxis:  Code Status: Full Code Family Communication:  Disposition Plan:  Diet: Diet NPO time specified Except for: Sips with Meds  Consultants:   None  Procedures:   None  Antimicrobials:   Zosyn  Objective: Filed Vitals:   06/09/16 2014 06/10/16 0425 06/10/16 0755 06/10/16 1104  BP:  94/50 95/51 100/56 85/53  Pulse: 70 70 71 69  Temp: 97.7 F (36.5 C) 97.9 F (36.6 C) 97.7 F (36.5 C) 97.8 F (36.6 C)  TempSrc: Oral Oral Oral Oral  Resp: _0 Height:      Weight:  81.693 kg (180 lb 1.6 oz)    SpO2: 95% 98% 100% 97%     Intake/Output Summary (Last 24 hours) at 06/10/16 1131 Last data filed at 06/10/16 0821  Gross per 24 hour  Intake    100 ml  Output    725 ml  Net   -625 ml   Filed Weights   06/09/16 0701 06/09/16 1036 06/10/16 0425  Weight: 80.74 kg (178 lb) 82.283 kg (181 lb 6.4 oz) 81.693 kg (180 lb 1.6 oz)    Examination: General exam: Appears calm and comfortable  Respiratory system: Clear to auscultation. Respiratory effort normal. Cardiovascular system: S1 & S2 heard, RRR. No JVD, murmurs, rubs, gallops or clicks. No pedal edema. Gastrointestinal system: Abdomen is nondistended, soft and nontender. No organomegaly or masses felt. Normal bowel sounds heard. Central nervous system: Alert and oriented. No focal neurological deficits. Extremities: Symmetric 5 x 5 power. Skin: No rashes, lesions or ulcers Psychiatry: Judgement and insight appear normal. Mood & affect appropriate.   Data Reviewed: I have personally reviewed following labs and imaging studies  CBC:  Recent Labs Lab 06/09/16 0715 06/10/16 0246  WBC 12.1* 12.4*  HGB 15.4 13.5  HCT 45.4 40.0  MCV 95.8 93.7  PLT 206 563   Basic Metabolic Panel:  Recent Labs Lab 06/09/16 0759 06/10/16 0246  NA 131* 134*  K 3.8 4.0  CL 90* 94*  CO2 26 31  GLUCOSE 445* 188*  BUN 41* 41*  CREATININE 2.70* 2.88*  CALCIUM 10.6* 10.2  MG  --  2.2   GFR: Estimated Creatinine Clearance: 27.8 mL/min (by C-G formula based on Cr of 2.88). Liver Function Tests:  Recent Labs Lab 06/09/16 0759 06/10/16 0246  AST 354* 272*  ALT 177* 206*  ALKPHOS 456* 302*  BILITOT 3.2* 6.4*  PROT 10.2* 7.7  ALBUMIN 3.5 2.5*    Recent Labs Lab 06/09/16 0759  LIPASE 78*   No results for input(s): AMMONIA in the last 168 hours. Coagulation Profile:  Recent Labs Lab 06/09/16 1041 06/10/16 0246  INR 1.27 1.60*   Cardiac Enzymes:  Recent Labs Lab 06/09/16 1041 06/09/16 1757 06/09/16 2153  TROPONINI 0.04* 0.04* 0.05*   BNP  (last 3 results) No results for input(s): PROBNP in the last 8760 hours. HbA1C: No results for input(s): HGBA1C in the last 72 hours. CBG:  Recent Labs Lab 06/09/16 1756 06/09/16 2013 06/10/16 0015 06/10/16 0406 06/10/16 0753  GLUCAP 280* 216* 206* 167* 178*   Lipid Profile: No results for input(s): CHOL, HDL, LDLCALC, TRIG, CHOLHDL, LDLDIRECT in the last 72 hours. Thyroid Function Tests: No results for input(s): TSH, T4TOTAL, FREET4, T3FREE, THYROIDAB in the last 72 hours. Anemia Panel: No results for input(s): VITAMINB12, FOLATE, FERRITIN, TIBC, IRON, RETICCTPCT in the last 72 hours. Urine analysis:    Component Value Date/Time   COLORURINE YELLOW 06/09/2016 1023   APPEARANCEUR CLEAR 06/09/2016 1023   LABSPEC 1.017 06/09/2016 1023   PHURINE 5.5 06/09/2016 1023   GLUCOSEU >1000* 06/09/2016 1023   HGBUR NEGATIVE 06/09/2016 [place] 06/09/2016 1023   KETONESUR NEGATIVE 06/09/2016 1023   PROTEINUR NEGATIVE 06/09/2016 1023   UROBILINOGEN 1.0 08/23/2015 0322   NITRITE NEGATIVE 06/09/2016 1023  LEUKOCYTESUR NEGATIVE 06/09/2016 1023   Sepsis Labs: _0 (procalcitonin:4,lacticidven:4)  )No results found for this or any previous visit (from the past 240 hour(s)).   Invalid input(s): PROCALCITONIN, LACTICACIDVEN   Radiology Studies: Dg Chest 2 View  06/09/2016  CLINICAL DATA:  Chest pain with shortness of breath for 1 day EXAM: CHEST  2 VIEW COMPARISON:  May 09, 2016 FINDINGS: There is no edema or consolidation. Heart is upper normal in size with pulmonary vascularity within normal limits. Pacemaker leads are attached to the right heart, stable. No adenopathy. No pneumothorax. There is atherosclerotic calcification in the aorta. There is degenerative change in the thoracic spine and in the shoulders bilaterally. IMPRESSION: No edema or consolidation. Stable pacemaker lead positions. Stable cardiac silhouette. Aortic atherosclerosis. Electronically Signed    By: Lowella Grip III M.D.   On: 06/09/2016 07:28   Nm Hepato W/eject Fract  06/09/2016  CLINICAL DATA:  Right upper quadrant pain. Nausea, vomiting. Cholelithiasis. Elevated LFTs. EXAM: NUCLEAR MEDICINE HEPATOBILIARY IMAGING TECHNIQUE: Sequential images of the abdomen were obtained out to 60 minutes following intravenous administration of radiopharmaceutical. RADIOPHARMACEUTICALS:  5.1 mCi Tc-44m Choletec IV COMPARISON:  Ultrasound performed today. Ultrasound 05/10/2016. CT 05/05/2016. FINDINGS: There is uptake of radiotracer by the liver. This is somewhat delayed as there is still some activity seen within the heart out to 2 hours. There is no excretion of radiotracer by the liver. Nonvisualization of the biliary system out to 2 hours. IMPRESSION: Somewhat delayed hepatic uptake of radiotracer. No hepatic excretion of radiotracer. FBurtis Junesthis is related to hepatic dysfunction, possibly hepatitis. Cannot assess gallbladder filling without biliary excretion. Given the findings on recent ultrasound, recommend further evaluation of the biliary system with MRI and MRCP. Electronically Signed   By: KRolm BaptiseM.D.   On: 06/09/2016 16:54   [place]dom[place] Limited Ruq  06/09/2016  CLINICAL DATA:  Right upper quadrant abdominal pain since midnight, known kidney stones, cirrhosis, diabetes, prostate malignancy. EXAM: [place]DOM[place] LIMITED - RIGHT UPPER QUADRANT COMPARISON:  Abdominal ultrasound of May 10, 2016 FINDINGS: Gallbladder: The gallbladder is adequately distended there is mild wall thickening to 4.3 mm. There is no positive sonographic Murphy's sign. There are echogenic mobile shadowing stones measuring up to 1.9 cm in diameter. Common bile duct: Diameter: 8.2 mm.  No intraluminal stones are observed. Liver: The hepatic echotexture is heterogeneous. The surface contour is irregular. There is no focal mass or ductal dilation. IMPRESSION: 1. Gallstones and mild gallbladder wall thickening possibly reflecting  cholecystitis. No pericholecystic fluid or positive sonographic Murphy's sign. 2. Mild dilation of the common bile duct without evidence of common duct stones. 3. Cirrhotic changes within the liver, stable. Electronically Signed   By: David  JMartiniqueM.D.   On: 06/09/2016 08:01        Scheduled Meds: . atorvastatin  80 mg Oral Daily  . ferrous sulfate  325 mg Oral BID WC  . insulin aspart  0-15 Units Subcutaneous TID WC  . insulin glargine  15 Units Subcutaneous BID  . metoprolol succinate  12.5 mg Oral BID  . pantoprazole  40 mg Oral Daily  . piperacillin-tazobactam (ZOSYN)  IV  3.375 g Intravenous Q8H  . potassium chloride SA  20 mEq Oral Daily  . sodium chloride flush  3 mL Intravenous Q12H  . torsemide  80 mg Oral q morning - 10a   And  . torsemide  40 mg Oral QPM   Continuous Infusions:       Time spent: 35  minutes    Birdie Hopes, MD [facility] Pager [phone]  If 7PM-7AM, please contact night-coverage [email or web address] Password [email or web address] 06/10/2016, 11:31 AM

## 2016-06-10 NOTE — Patient Outreach (Signed)
Encounter opened in error

## 2016-06-10 NOTE — Progress Notes (Signed)
Patient ID: [name], male   DOB: Jan 23, 1956, 60 y.o.   MRN: [id number]   Discussed with IR.  Gallbladder is not distended so a perc chole tube could be difficult to place.  Also, no dilated ducts were seen. I believe that his does have some mild acute cholecystitis on top of chronic cholecystitis, but clinically, is improving.  Given the HIDA  Scan, the elevated bilirubin may just be from a poorly functioning liver and CHF. I think an EUS by GI could help but is not emergent unless he starts showing signs of cholangitis. If he develops more pain and tenderness, will repeat his ultrasound first to see if the gallbladder is now distended or the ducts are dilated.

## 2016-06-10 NOTE — Consult Note (Signed)
[name] CM Inpatient Consult   06/10/2016  [name] 1956/02/12 [id number]   Patient is currently active [up to admission]  with [name] for chronic disease management services.  Patient has been engaged by a [name].  Our community based plan of care has focused on disease management and community resource support. Pt presented to [name] with worsening RUQ pain with N/V. Denies fever, chills, or night sweats. Weight stable. Has had some swelling in his R leg s/p presumed insect bite. Met with the patient, HIPAA verified with name, DOB, and address.  Patient states that his pain was so bad he couldn't stand it any longer.  He states he was hoping to have the surgery for a drain at the end of this month but he was getting too sick.  Patient states he has been able to make it fairly well although he is in the donut hole with his medications which should resolve also by the end of this month.  Active consent on file.  Patient will receive a post discharge transition of care call and will be evaluated for monthly home visits for assessments and disease process education.  Made Inpatient Case Manager aware that [name] following.  Community Care Coordinator made aware of inpatient.   Of note, [name] services does not replace or interfere with any services that are needed or arranged by inpatient case management or social work.  For additional questions or referrals please contact:  Robert Brood, RN BSN [facility] Liaison  [phone] business mobile phone Toll free office [phone]

## 2016-06-10 NOTE — Progress Notes (Signed)
Pharmacy Antibiotic Note  [name] is a 60 y.o. male admitted on 06/09/2016 with cholecystitis.  Pharmacy has been consulted for vancomycin dosing.  Patient with leukocytosis from cholecystitis vs cellulitic changes per note from Dr. Hartford Poli today. Will dose vancomycin for cellulitis. Continues on Zosyn for cholecystitis.   Plan: Vancomycin 750 IV every 24 hours.  Goal trough 10-15 mcg/mL. Zosyn 3.375g IV q8h (4 hour infusion).  Height: 5\' 10"  (177.8 cm) Weight: 180 lb 1.6 oz (81.693 kg) (Scale A) IBW/kg (Calculated) : 73  Temp (24hrs), Avg:97.8 F (36.6 C), Min:97.7 F (36.5 C), Max:97.9 F (36.6 C)   Recent Labs Lab 06/09/16 0715 06/09/16 0759 06/10/16 0246  WBC 12.1*  --  12.4*  CREATININE  --  2.70* 2.88*    Estimated Creatinine Clearance: 27.8 mL/min (by C-G formula based on Cr of 2.88).    No Known Allergies  Antimicrobials this admission: Zosyn 7/5 >>  Vancomycin 7/6 >>   Dose adjustments this admission: n/a  Microbiology results: No cultures  Thank you for allowing pharmacy to be a part of this patient's care.  Jhovani Griswold D. Rubi Tooley, PharmD, BCPS Clinical Pharmacist Pager: [phone] 06/10/2016 6:50 PM

## 2016-06-10 NOTE — Progress Notes (Signed)
Radiology called to update this RN on status of pt's scheduled abdominal US. Radiology stated there were 5 emergency room procedures that will have to be done before they can retrieve this patient. This RN was asked if pt's [place] can be done tomorrow. This RN informed Radiology that pt's [place] still needs to be done ASAP (atleast before AM MD rounds). Will continue to monitor.

## 2016-06-10 NOTE — Progress Notes (Signed)
Subjective: Patient reports mild RUQ pain  Objective: Vital signs in last 24 hours: Temp:  [97.7 F (36.5 C)-98.7 F (37.1 C)] 97.7 F (36.5 C) (07/06 0755) Pulse Rate:  [69-75] 71 (07/06 0755) Resp:  [11-24] 18 (07/06 0755) BP: (94-133)/(48-60) 100/56 mmHg (07/06 0755) SpO2:  [95 %-100 %] 100 % (07/06 0755) Weight:  [81.693 kg (180 lb 1.6 oz)-82.283 kg (181 lb 6.4 oz)] 81.693 kg (180 lb 1.6 oz) (07/06 0425) Last BM Date: 06/09/16  Intake/Output from previous day: 07/05 0701 - 07/06 0700 In: 100 [IV Piggyback:100] Out: 725 [Urine:725] Intake/Output this shift:    Abdomen soft but there is some tenderness and guarding in the RUQ   Lab Results:   Recent Labs  06/09/16 0715 06/10/16 0246  WBC 12.1* 12.4*  HGB 15.4 13.5  HCT 45.4 40.0  PLT 206 161   BMET  Recent Labs  06/09/16 0759 06/10/16 0246  NA 131* 134*  K 3.8 4.0  CL 90* 94*  CO2 26 31  GLUCOSE 445* 188*  BUN 41* 41*  CREATININE 2.70* 2.88*  CALCIUM 10.6* 10.2   PT/INR  Recent Labs  06/09/16 1041 06/10/16 0246  LABPROT 16.1* 19.1*  INR 1.27 1.60*   ABG No results for input(s): PHART, HCO3 in the last 72 hours.  Invalid input(s): PCO2, PO2  Studies/Results: Dg Chest 2 View  06/09/2016  CLINICAL DATA:  Chest pain with shortness of breath for 1 day EXAM: CHEST  2 VIEW COMPARISON:  May 09, 2016 FINDINGS: There is no edema or consolidation. Heart is upper normal in size with pulmonary vascularity within normal limits. Pacemaker leads are attached to the right heart, stable. No adenopathy. No pneumothorax. There is atherosclerotic calcification in the aorta. There is degenerative change in the thoracic spine and in the shoulders bilaterally. IMPRESSION: No edema or consolidation. Stable pacemaker lead positions. Stable cardiac silhouette. Aortic atherosclerosis. Electronically Signed   By: Lowella Grip III M.D.   On: 06/09/2016 07:28   Nm Hepato W/eject Fract  06/09/2016  CLINICAL DATA:   Right upper quadrant pain. Nausea, vomiting. Cholelithiasis. Elevated LFTs. EXAM: NUCLEAR MEDICINE HEPATOBILIARY IMAGING TECHNIQUE: Sequential images of the abdomen were obtained out to 60 minutes following intravenous administration of radiopharmaceutical. RADIOPHARMACEUTICALS:  5.1 mCi Tc-42m  Choletec IV COMPARISON:  Ultrasound performed today. Ultrasound 05/10/2016. CT 05/05/2016. FINDINGS: There is uptake of radiotracer by the liver. This is somewhat delayed as there is still some activity seen within the heart out to 2 hours. There is no excretion of radiotracer by the liver. Nonvisualization of the biliary system out to 2 hours. IMPRESSION: Somewhat delayed hepatic uptake of radiotracer. No hepatic excretion of radiotracer. Burtis Junes this is related to hepatic dysfunction, possibly hepatitis. Cannot assess gallbladder filling without biliary excretion. Given the findings on recent ultrasound, recommend further evaluation of the biliary system with MRI and MRCP. Electronically Signed   By: Rolm Baptise M.D.   On: 06/09/2016 16:54   US Abdomen Limited Ruq  06/09/2016  CLINICAL DATA:  Right upper quadrant abdominal pain since midnight, known kidney stones, cirrhosis, diabetes, prostate malignancy. EXAM: US ABDOMEN LIMITED - RIGHT UPPER QUADRANT COMPARISON:  Abdominal ultrasound of May 10, 2016 FINDINGS: Gallbladder: The gallbladder is adequately distended there is mild wall thickening to 4.3 mm. There is no positive sonographic Murphy's sign. There are echogenic mobile shadowing stones measuring up to 1.9 cm in diameter. Common bile duct: Diameter: 8.2 mm.  No intraluminal stones are observed. Liver: The hepatic echotexture is heterogeneous. The  surface contour is irregular. There is no focal mass or ductal dilation. IMPRESSION: 1. Gallstones and mild gallbladder wall thickening possibly reflecting cholecystitis. No pericholecystic fluid or positive sonographic Murphy's sign. 2. Mild dilation of the common bile  duct without evidence of common duct stones. 3. Cirrhotic changes within the liver, stable. Electronically Signed   By: David  Martinique M.D.   On: 06/09/2016 08:01    Anti-infectives: Anti-infectives    Start     Dose/Rate Route Frequency Ordered Stop   06/09/16 1400  piperacillin-tazobactam (ZOSYN) IVPB 3.375 g     3.375 g 12.5 mL/hr over 240 Minutes Intravenous Every 8 hours 06/09/16 1122     06/09/16 0845  piperacillin-tazobactam (ZOSYN) IVPB 3.375 g     3.375 g 100 mL/hr over 30 Minutes Intravenous  Once 06/09/16 0836 06/09/16 0936      Assessment/Plan:  Cholelithiasis with possible cholecystitis and elevated LFT's  HIDA scan equivocal given delayed processing by the liver.  Could not determine whether there was cholecystitis of CBD stone. MRCP is recommended but patient can not undergo MRI given ICD.  Will await GI's opinion whether he should have an ERCP. Again, he is not a candidate for Lap chole from a cardiac standpoint     Shelagh Rayman A 06/10/2016

## 2016-06-10 NOTE — Consult Note (Signed)
Chief Complaint: Patient was seen in consultation today for percutaneous cholecystostomy Chief Complaint  Patient presents with  . Chest Pain    Referring Physician(s): Blackman,D  Supervising Physician: Markus Daft  Patient Status: Inpatient  History of Present Illness: [name] is a 60 y.o. male with significant past medical history including coronary artery disease, CHF, ICM with ejection fraction 20-25%, ICD/pacer, chronic atrial fibrillation, status post AVN ablation, prior MI 2001, prior TIA, chronic kidney disease, GERD, hypertension, subarachnoid hemorrhage  2016, rectal cancer in 2008 with prior colectomy/colostomy, prostate cancer with prior seed implantation, diabetes, obstructive sleep apnea on CPAP, cirrhosis by imaging and nephrolithiasis as well as cholelithiasis. He was admitted to the hospital on 7/5 with right upper and left upper quadrant abd pain as well as vomiting. Abdominal ultrasound revealed gallstones and mild gallbladder wall thickening but no pericholecystic fluid or positive Murphy sign. There was mild dilation of the common bile duct without evidence of common duct stones and cirrhotic changes within the liver which are stable. Patient did have prior admission in June 2017 with cholelithiasis which was treated with antibiotic therapy/ no further intervention. He does have a history of elevated LFTs with current total bilirubin of 6.4. Request now received from surgical service for percutaneous cholecystostomy.  Past Medical History  Diagnosis Date  . Essential hypertension, benign   . CAD (coronary artery disease)     a. BMS to LAD 2001 at [facility] b. PTCA/atherectomy ramus and BMS to LAD 2009  . Chronic systolic heart failure ([place])   . GERD (gastroesophageal reflux disease)   . Paroxysmal atrial fibrillation (H[facility])     a. on amiodarone, digoxin and Eliquis  . TIA (transient ischemic attack)   . HLD (hyperlipidemia)   . Orthostatic hypotension   .  Dizziness     a. chronic. Admission for this 07/18/2014  . Ischemic cardiomyopathy     EF 18% by nuclear study 2016, multiple myocardial infarctions in past    . Obesity   . Hematuria   . History of blood transfusion     "I've had 2 units so far this year" (09/27/2015)  . Anemia   . SAH (subarachnoid hemorrhage) ([place])     post-traumatic (fall) [facility] 12/2014  . PONV (postoperative nausea and vomiting)   . CHF (congestive heart failure) ([place])   . Myocardial infarction ([place]) 2001  . HCAP (healthcare-associated pneumonia) 07/21/2015  . OSA on CPAP   . Chronic kidney disease, stage IV (severe) ([place])   . DM type 2, uncontrolled, with renal complications ([place])   . Colostomy in place [facility])   . Adenocarcinoma of rectum ([place])     a. 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 Prostate cancer ([place])     a. s/p seed implants with chemo and radiation  . Cholelithiasis 06/2015    Past Surgical History  Procedure Laterality Date  . Cardiac defibrillator placement  2002  . Abdominal and perineal resection of rectum with total mesorectal excision  10/04/2007  . Colonoscopy  09/14/2011    Dr. Gala Romney: via colostomy, Single pedunculated benign inflammatory polyp. Due for surveillance Oct 2015  . Colostomy  2008  . Colonoscopy N/A 07/02/2014    Procedure: COLONOSCOPY;  Surgeon: Daneil Dolin, MD;  [facility];  Service: Endoscopy;  Laterality: N/A;  7:30 / COLONOSCOPY THRU COLOSTOMY  . Esophagogastroduodenoscopy N/A 07/02/2014    Procedure: ESOPHAGOGASTRODUODENOSCOPY (EGD);  Surgeon: Daneil Dolin, MD;  [facility];  Service: Endoscopy;  Laterality:  N/A;  7:30  . Savory dilation N/A 07/02/2014    Procedure: SAVORY DILATION;  Surgeon: Daneil Dolin, MD;  [facility];  Service: Endoscopy;  Laterality: N/A;  7:30  . Maloney dilation N/A 07/02/2014    Procedure: Venia Minks DILATION;  Surgeon: Daneil Dolin, MD;  [facility];  Service: Endoscopy;  Laterality: N/A;  7:30  . Portacath  placement  06/2007    "removed ~ 1 yr later"  . Left heart catheterization with coronary angiogram N/A 07/13/2013    Procedure: LEFT HEART CATHETERIZATION WITH CORONARY ANGIOGRAM;  Surgeon: Lorretta Harp, MD;  Location: [facility] CATH LAB;  Service: Cardiovascular;  Laterality: N/A;  . Colonoscopy N/A 12/11/2014    Dr. Gala Romney via colostomy. Normal. Repeat in 2021.   Marland Kitchen Esophagogastroduodenoscopy N/A 12/11/2014    [id number] EGD  . Cardiac defibrillator placement  2009    Upgraded to a BiV ICD  . Right heart catheterization N/A 02/24/2015    Procedure: RIGHT HEART CATH;  Surgeon: Jolaine Artist, MD;  Location: [facility] CATH LAB;  Service: Cardiovascular;  Laterality: N/A;  . Ep implantable device N/A 04/10/2015    Procedure: Ppm/Biv Ppm Generator Changeout;  Surgeon: Evans Lance, MD;  Location: [facility] INVASIVE CV LAB CUPID;  Service: Cardiovascular;  Laterality: N/A;  . Bi-ventricular implantable cardioverter defibrillator  (crt-d)  2009  . Cardiac catheterization  08/2001; 2009    ; /notes 07/10/2013  . Coronary angioplasty with stent placement  2001; ~ 2006    "1 + 1"   . Givens capsule study N/A 07/23/2015    Procedure: GIVENS CAPSULE STUDY;  Surgeon: Wilford Corner, MD;  Location: [facility] ENDOSCOPY;  Service: Endoscopy;  Laterality: N/A;  . Colonoscopy N/A 08/24/2015    Procedure: COLONOSCOPY;  Surgeon: Manus Gunning, MD;  Location: [place];  Service: Gastroenterology;  Laterality: N/A;  . Electrophysiologic study N/A 08/28/2015    Procedure: AV Node Ablation;  Surgeon: Will Meredith Leeds, MD;  Location: [place] CV LAB;  Service: Cardiovascular;  Laterality: N/A;  . Av fistula placement Right 09/16/2015    Procedure: ARTERIOVENOUS (AV) FISTULA CREATION - BRACHIOCEPHALIC;  Surgeon: Elam Dutch, MD;  Location: [facility];  Service: Vascular;  Laterality: Right;    Allergies: Review of patient's allergies indicates no known allergies.  Medications: Prior to Admission medications   Medication Sig  Start Date End Date Taking? Authorizing Provider  apixaban (ELIQUIS) 5 MG TABS tablet Take 1 tablet (5 mg total) by mouth 2 (two) times daily. 04/26/16  Yes Jolaine Artist, MD  atorvastatin (LIPITOR) 80 MG tablet Take 1 tablet (80 mg total) by mouth daily. 09/04/15  Yes Larey Dresser, MD  co-enzyme Q-10 50 MG capsule Take 50 mg by mouth every morning.    Yes Historical Provider, MD  ferrous sulfate 325 (65 FE) MG tablet Take 1 tablet (325 mg total) by mouth 2 (two) times daily with a meal. 07/24/15  Yes Shanker Kristeen Mans, MD  insulin glargine (LANTUS) 100 UNIT/ML injection Inject 30 Units into the skin every morning.   Yes Historical Provider, MD  metoprolol succinate (TOPROL-XL) 25 MG 24 hr tablet Take 0.5 tablets (12.5 mg total) by mouth 2 (two) times daily. 04/12/16  Yes Jolaine Artist, MD  nitroGLYCERIN (NITROLINGUAL) 0.4 MG/SPRAY spray Place 1 spray under the tongue every 5 (five) minutes x 3 doses as needed for chest pain. Reported on 06/01/2016   Yes Historical Provider, MD  pantoprazole (PROTONIX) 40 MG tablet  Take 1 tablet (40 mg total) by mouth daily. 05/07/16  Yes Shirley Friar, PA-C  potassium chloride SA (K-DUR,KLOR-CON) 20 MEQ tablet Take 20 mEq by mouth daily.   Yes Historical Provider, MD  PROAIR RESPICLICK [id number] (90 BASE) MCG/ACT AEPB Inhale 2 puffs into the lungs every 6 (six) hours as needed (for breathing).  08/15/15  Yes Historical Provider, MD  torsemide (DEMADEX) 20 MG tablet Take 4 tablets by mouth every morning and 2 tables by mouth every evening 04/12/16  Yes Amy D Clegg, NP  traMADol (ULTRAM) 50 MG tablet Take 1 tablet (50 mg total) by mouth every 6 (six) hours as needed. 05/12/16  Yes Reyne Dumas, MD  metolazone (ZAROXOLYN) 2.5 MG tablet Take 2.5 mg by mouth as needed. Takes about once a  month 05/02/16   Historical Provider, MD  Omega-3 Fatty Acids (FISH OIL) 1000 MG CAPS Take 1,000 mg by mouth 2 (two) times daily.    Historical Provider, MD     Family History    Problem Relation Age of Onset  . Colon cancer Mother 60  . Colon cancer Sister 6  . Coronary artery disease Father   . Colon cancer Other     2 cousins, succumbed to illness    Social History   Social History  . Marital Status: Married    Spouse Name: N/A  . Number of Children: 1  . Years of Education: N/A   Occupational History  .     Social History Main Topics  . Smoking status: Never Smoker   . Smokeless tobacco: Never Used  . Alcohol Use: No     Comment: Former user 45 years ago  . Drug Use: No  . Sexual Activity: No   Other Topics Concern  . None   Social History Narrative   Married 33 years   1 son, 2 grandkids    Denies alcohol, drugs, tobacco       Review of Systems currently denies fever, headache, chest pain, dyspnea, cough, back pain, nausea, vomiting or abnormal bleeding. He does have abdominal pain primarily right upper quadrant at this time.  Vital Signs: BP 85/53 mmHg  Pulse 69  Temp(Src) 97.8 F (36.6 C) (Oral)  Resp 18  Ht 5\' 10"  (1.778 m)  Wt 180 lb 1.6 oz (81.693 kg)  BMI 25.84 kg/m2  SpO2 97%  Physical Exam patient awake, alert. Jaundiced; chest clear to auscultation bilaterally. Heart with regular rate and rhythm. Left chest AICD. Abdomen soft, positive bowel sounds, mild right upper quadrant tenderness. Clean, intact left abdominal colostomy; Lower extremities with no significant edema. Right  arm fistula with positive thrill/bruit.  Mallampati Score:     Imaging: Dg Chest 2 View  06/09/2016  CLINICAL DATA:  Chest pain with shortness of breath for 1 day EXAM: CHEST  2 VIEW COMPARISON:  May 09, 2016 FINDINGS: There is no edema or consolidation. Heart is upper normal in size with pulmonary vascularity within normal limits. Pacemaker leads are attached to the right heart, stable. No adenopathy. No pneumothorax. There is atherosclerotic calcification in the aorta. There is degenerative change in the thoracic spine and in the shoulders  bilaterally. IMPRESSION: No edema or consolidation. Stable pacemaker lead positions. Stable cardiac silhouette. Aortic atherosclerosis. Electronically Signed   By: Lowella Grip III M.D.   On: 06/09/2016 07:28   Nm Hepato W/eject Fract  06/09/2016  CLINICAL DATA:  Right upper quadrant pain. Nausea, vomiting. Cholelithiasis. Elevated LFTs. EXAM: NUCLEAR MEDICINE HEPATOBILIARY IMAGING TECHNIQUE:  Sequential images of the abdomen were obtained out to 60 minutes following intravenous administration of radiopharmaceutical. RADIOPHARMACEUTICALS:  5.1 mCi Tc-1m  Choletec IV COMPARISON:  Ultrasound performed today. Ultrasound 05/10/2016. CT 05/05/2016. FINDINGS: There is uptake of radiotracer by the liver. This is somewhat delayed as there is still some activity seen within the heart out to 2 hours. There is no excretion of radiotracer by the liver. Nonvisualization of the biliary system out to 2 hours. IMPRESSION: Somewhat delayed hepatic uptake of radiotracer. No hepatic excretion of radiotracer. Burtis Junes this is related to hepatic dysfunction, possibly hepatitis. Cannot assess gallbladder filling without biliary excretion. Given the findings on recent ultrasound, recommend further evaluation of the biliary system with MRI and MRCP. Electronically Signed   By: Rolm Baptise M.D.   On: 06/09/2016 16:54   US Abdomen Limited Ruq  06/09/2016  CLINICAL DATA:  Right upper quadrant abdominal pain since midnight, known kidney stones, cirrhosis, diabetes, prostate malignancy. EXAM: US ABDOMEN LIMITED - RIGHT UPPER QUADRANT COMPARISON:  Abdominal ultrasound of May 10, 2016 FINDINGS: Gallbladder: The gallbladder is adequately distended there is mild wall thickening to 4.3 mm. There is no positive sonographic Murphy's sign. There are echogenic mobile shadowing stones measuring up to 1.9 cm in diameter. Common bile duct: Diameter: 8.2 mm.  No intraluminal stones are observed. Liver: The hepatic echotexture is heterogeneous. The  surface contour is irregular. There is no focal mass or ductal dilation. IMPRESSION: 1. Gallstones and mild gallbladder wall thickening possibly reflecting cholecystitis. No pericholecystic fluid or positive sonographic Murphy's sign. 2. Mild dilation of the common bile duct without evidence of common duct stones. 3. Cirrhotic changes within the liver, stable. Electronically Signed   By: David  Martinique M.D.   On: 06/09/2016 08:01    Labs:  CBC:  Recent Labs  05/11/16 0602 05/12/16 0458 06/09/16 0715 06/10/16 0246  WBC 4.5 4.6 12.1* 12.4*  HGB 11.2* 12.0* 15.4 13.5  HCT 34.9* 36.6* 45.4 40.0  PLT 136* 135* 206 161    COAGS:  Recent Labs  09/26/15 1820 05/10/16 0140 06/09/16 1041 06/10/16 0246  INR 1.59* 1.51* 1.27 1.60*  APTT  --   --   --  32    BMP:  Recent Labs  05/12/16 0458 06/01/16 1200 06/09/16 0759 06/10/16 0246  NA 137 134* 131* 134*  K 3.7 4.8 3.8 4.0  CL 97* 96* 90* 94*  CO2 26 26 26 31   GLUCOSE 179* 391* 445* 188*  BUN 33* 36* 41* 41*  CALCIUM 9.7 10.3 10.6* 10.2  CREATININE 2.66* 2.74* 2.70* 2.88*  GFRNONAA 24* 23* 24* 22*  GFRAA 28* 27* 28* 26*    LIVER FUNCTION TESTS:  Recent Labs  05/11/16 0602 05/12/16 0458 06/09/16 0759 06/10/16 0246  BILITOT 4.4* 2.6* 3.2* 6.4*  AST 317* 153* 354* 272*  ALT 327* 245* 177* 206*  ALKPHOS 232* 218* 456* 302*  PROT 7.4 8.0 10.2* 7.7  ALBUMIN 3.0* 3.0* 3.5 2.5*    TUMOR MARKERS:  Recent Labs  09/19/15 1058 05/11/16 0848  CEA 1.4 2.0    Assessment and Plan: 60 y.o. male with significant past medical history including coronary artery disease, CHF, ICM with ejection fraction 20-25%, ICD/pacer, chronic atrial fibrillation, status post AVN ablation 2016, prior MI 2001, prior TIA, chronic kidney disease, GERD, hypertension, subarachnoid hemorrhage  2016, rectal cancer in 2008 with prior colectomy/colostomy, prostate cancer with prior seed implantation, diabetes, obstructive sleep apnea on CPAP,  cirrhosis by imaging and nephrolithiasis as well as cholelithiasis. He  was admitted to the hospital on 7/5 with right upper and left upper quadrant abd pain as well as vomiting. Abdominal ultrasound revealed gallstones and mild gallbladder wall thickening but no pericholecystic fluid or positive Murphy sign. There was mild dilation of the common bile duct without evidence of common duct stones, and cirrhotic changes within the liver which were stable. Patient did have prior admission in June 2017 with cholelithiasis which was treated with antibiotic therapy/ no further intervention. He does have a history of elevated LFTs with current total bilirubin of 6.4. Request now received from surgical service for percutaneous cholecystostomy.  Case/imaging studies were reviewed by Dr. Anselm Pancoast and d/w Dr. Ninfa Linden. Latest abdominal ultrasound does not reveal significant gallbladder distention.  Percutaneous cholecystostomy at this time would be technically difficult and may not completely decompress the biliary system. Recommend continued conservative management. BP also soft at this time with systolic readings in the [id number]. PT 19.1/INR 1.6, hgb 13.5, plts 161k, creat 2.88- not on HD. If patient develops fever or worsening leukocytosis/abdominal pain can reconsider cholecystostomy. Patient was on Eliquis as OP but has not received it since admission. Eliquis will need to be held for 48 hours before cholecystostomy if performed; in interim can convert to IV heparin if needed per cardiology. Details/risks of percutaneous cholecystostomy, including not limited to, internal bleeding, infection/sepsis, injury to adjacent organs discussed with patient.    Thank you for this interesting consult.  I greatly enjoyed meeting MOHANNAD BERRIGAN and look forward to participating in their care.  A copy of this report was sent to the requesting provider on this date.  Electronically Signed: D. Rowe Robert 06/10/2016, 11:44 AM   I spent a  total of  40 minutes   in face to face in clinical consultation, greater than 50% of which was counseling/coordinating care for percutaneous cholecystostomy

## 2016-06-10 NOTE — Progress Notes (Signed)
Advanced Heart Failure Rounding Note  Referring Physician: Dr Lily Kocher PCP: Dr. Gerarda Fraction Oncologist: Dr Benay Spice.  CHF: [name]  Reason for Consultation: Cardiac consideration/clearance for GI surgery.   Subjective:    Admitted 06/09/16 with worsening ABD pain + N/V in setting of chronic cholecystitis.   Total bili 3.2 -> 6.4 AST 654 -> 272 ALT 177 -> 206 Alk Phos 456 -> 302  HIDA scan 06/09/16 with somewhat delayed hepatic uptake of radiotracer. No hepatic excretion of radiotracer. [name] this is related to hepatic dysfunction, possibly hepatitis. Cannot assess gallbladder filling without biliary excretion. Recommend MRI/MRCP; No MRI with ICD.  ABD [place] with gallstones, 8 mm CBD stone, and mild wall thickening possibly reflecting cholecystitis.  Awaiting GI opinion on ERCP. Not lap chole candidate.   Denies SOB or CP. Still with some RUQ pain and tenderness.  Currently NPO for procedure consideration.   Weight stable. Creatinine 2.70 -> 2.88  Objective:   Weight Range: 180 lb 1.6 oz (81.693 kg) Body mass index is 25.84 kg/(m^2).   Vital Signs:   Temp:  [97.7 F (36.5 C)-98.7 F (37.1 C)] 97.7 F (36.5 C) (07/06 0755) Pulse Rate:  [69-75] 71 (07/06 0755) Resp:  [13-24] 18 (07/06 0755) BP: (94-130)/(48-60) 100/56 mmHg (07/06 0755) SpO2:  [95 %-100 %] 100 % (07/06 0755) Weight:  [180 lb 1.6 oz (81.693 kg)-181 lb 6.4 oz (82.283 kg)] 180 lb 1.6 oz (81.693 kg) (07/06 0425) Last BM Date: 06/09/16  Weight change: Filed Weights   06/09/16 0701 06/09/16 1036 06/10/16 0425  Weight: 178 lb (80.74 kg) 181 lb 6.4 oz (82.283 kg) 180 lb 1.6 oz (81.693 kg)    Intake/Output:   Intake/Output Summary (Last 24 hours) at 06/10/16 0909 Last data filed at 06/10/16 4818  Gross per 24 hour  Intake    100 ml  Output    725 ml  Net   -625 ml     Physical Exam: General: NAD, HEENT: Normal.  Neck: JVP ~7 cm. no thyromegaly or thyroid nodule.  Lungs: CTAB, normal effort.   CV: Nondisplaced PMI. Regular S1S2, 2/6 SEM RUSB. No peripheral edema. No carotid bruit. Normal pedal pulses.  Abdomen: Soft, RUQ tenderness, ND, no HSM appreciated. S/p colostomy.  Skin: Intact without lesions or rashes.  Neurologic: Alert and oriented x 3.  Psych: Normal affect. Extremities: No clubbing or cyanosis. [name] with ? Vasculitic/cellulitic changes with no obvious lesion, healing   Telemetry: Reviewed personally, Vpaced rhythm 70s  Labs: CBC  Recent Labs  06/09/16 0715 06/10/16 0246  WBC 12.1* 12.4*  HGB 15.4 13.5  HCT 45.4 40.0  MCV 95.8 93.7  PLT 206 563   Basic Metabolic Panel  Recent Labs  06/09/16 0759 06/10/16 0246  NA 131* 134*  K 3.8 4.0  CL 90* 94*  CO2 26 31  GLUCOSE 445* 188*  BUN 41* 41*  CREATININE 2.70* 2.88*  CALCIUM 10.6* 10.2  MG  --  2.2   Liver Function Tests  Recent Labs  06/09/16 0759 06/10/16 0246  AST 354* 272*  ALT 177* 206*  ALKPHOS 456* 302*  BILITOT 3.2* 6.4*  PROT 10.2* 7.7  ALBUMIN 3.5 2.5*    Recent Labs  06/09/16 0759  LIPASE 78*   Cardiac Enzymes  Recent Labs  06/09/16 1041 06/09/16 1757 06/09/16 2153  TROPONINI 0.04* 0.04* 0.05*    BNP: BNP (last 3 results)  Recent Labs  09/26/15 1820 05/09/16 2343 06/01/16 1200  BNP 1393.4* 377.6* 1016.7*  ProBNP (last 3 results) No results for input(s): PROBNP in the last 8760 hours.   D-Dimer No results for input(s): DDIMER in the last 72 hours. Hemoglobin A1C No results for input(s): HGBA1C in the last 72 hours. Fasting Lipid Panel No results for input(s): CHOL, HDL, LDLCALC, TRIG, CHOLHDL, LDLDIRECT in the last 72 hours. Thyroid Function Tests No results for input(s): TSH, T4TOTAL, T3FREE, THYROIDAB in the last 72 hours.  Invalid input(s): FREET3  Other results:     Imaging/Studies:  Dg Chest 2 View  06/09/2016  CLINICAL DATA:  Chest pain with shortness of breath for 1 day EXAM: CHEST  2 VIEW COMPARISON:  May 09, 2016  FINDINGS: There is no edema or consolidation. Heart is upper normal in size with pulmonary vascularity within normal limits. Pacemaker leads are attached to the right heart, stable. No adenopathy. No pneumothorax. There is atherosclerotic calcification in the aorta. There is degenerative change in the thoracic spine and in the shoulders bilaterally. IMPRESSION: No edema or consolidation. Stable pacemaker lead positions. Stable cardiac silhouette. Aortic atherosclerosis. Electronically Signed   By: Lowella Grip III M.D.   On: 06/09/2016 07:28   Nm Hepato W/eject Fract  06/09/2016  CLINICAL DATA:  Right upper quadrant pain. Nausea, vomiting. Cholelithiasis. Elevated LFTs. EXAM: NUCLEAR MEDICINE HEPATOBILIARY IMAGING TECHNIQUE: Sequential images of the abdomen were obtained out to 60 minutes following intravenous administration of radiopharmaceutical. RADIOPHARMACEUTICALS:  5.1 mCi Tc-37m Choletec IV COMPARISON:  Ultrasound performed today. Ultrasound 05/10/2016. CT 05/05/2016. FINDINGS: There is uptake of radiotracer by the liver. This is somewhat delayed as there is still some activity seen within the heart out to 2 hours. There is no excretion of radiotracer by the liver. Nonvisualization of the biliary system out to 2 hours. IMPRESSION: Somewhat delayed hepatic uptake of radiotracer. No hepatic excretion of radiotracer. FBurtis Junesthis is related to hepatic dysfunction, possibly hepatitis. Cannot assess gallbladder filling without biliary excretion. Given the findings on recent ultrasound, recommend further evaluation of the biliary system with MRI and MRCP. Electronically Signed   By: KRolm BaptiseM.D.   On: 06/09/2016 16:54   [place]dom[place] Limited Ruq  06/09/2016  CLINICAL DATA:  Right upper quadrant abdominal pain since midnight, known kidney stones, cirrhosis, diabetes, prostate malignancy. EXAM: [place]DOM[place] LIMITED - RIGHT UPPER QUADRANT COMPARISON:  Abdominal ultrasound of May 10, 2016 FINDINGS: Gallbladder:  The gallbladder is adequately distended there is mild wall thickening to 4.3 mm. There is no positive sonographic Murphy's sign. There are echogenic mobile shadowing stones measuring up to 1.9 cm in diameter. Common bile duct: Diameter: 8.2 mm.  No intraluminal stones are observed. Liver: The hepatic echotexture is heterogeneous. The surface contour is irregular. There is no focal mass or ductal dilation. IMPRESSION: 1. Gallstones and mild gallbladder wall thickening possibly reflecting cholecystitis. No pericholecystic fluid or positive sonographic Murphy's sign. 2. Mild dilation of the common bile duct without evidence of common duct stones. 3. Cirrhotic changes within the liver, stable. Electronically Signed   By: David  JMartiniqueM.D.   On: 06/09/2016 08:01     Latest Echo  Latest Cath   Medications:     Scheduled Medications: . atorvastatin  80 mg Oral Daily  . ferrous sulfate  325 mg Oral BID WC  . insulin aspart  0-15 Units Subcutaneous TID WC  . insulin glargine  15 Units Subcutaneous BID  . metoprolol succinate  12.5 mg Oral BID  . pantoprazole  40 mg Oral Daily  . piperacillin-tazobactam (ZOSYN)  IV  3.375 g Intravenous Q8H  . potassium chloride SA  20 mEq Oral Daily  . sodium chloride flush  3 mL Intravenous Q12H  . torsemide  80 mg Oral q morning - 10a   And  . torsemide  40 mg Oral QPM     Infusions:     PRN Medications:  bisacodyl, HYDROcodone-acetaminophen, magnesium citrate, morphine injection, morphine injection, ondansetron **OR** ondansetron (ZOFRAN) IV, senna-docusate, traZODone   Assessment   1. Acute on chronic cholecystitis 2. Chronic systolic CHF - ICM - Echo 05/11/16 with LVEF 10-15%, mild AI, mild MR, normal RV.  3. Chronic Afib with CHADS2VASC score of 5. S/p AV node ablation 4. CAD s/p NSTEMI in 04/20/16 5. CKD stage IV s/p AVF in RUE 6. h/o rectal cancer 2008: Has diverting colostomy. He has follow up with Dr Benay Spice. - Recent CT (05/02/16) with  no evidence of recurrence. 7. HLD.  Plan    Pt would be moderate to high risk for surgery with low EF and active cholecystitis.   HIDA scan results as above with non specific GB findings. Awaiting GI recommended for possible ERCP.   We are OK with pursuing ERCP with careful peri-op management.   If off Eliquis for more than 48 hours, will need to consider heparin bridging.   Satira Mccallum Tillery PA-C 06/10/2016, 9:09 AM  Advanced Heart Failure Team Pager [phone] (M-F; 7a - 4p)  Please contact [facility] for night-coverage after hours (4p -7a ) and weekends on [email or web address]  Patient seen and examined with Oda Kilts, PA-C. We discussed all aspects of the encounter. I agree with the assessment and plan as stated above.   HF stable. GI and [facility] notes reviewed.  I came to see him tonight and he is doubled over on the side of his bed with sever ab pain and having chills. He is tender in his RUQ but no rebound. LFTs remain elevated and bilirubin now climbing (this is not due to HF as HF well compensated currently).  I discussed with Dr. Hartford Poli and we will go ahead and expand antibiotic coverage. I also paged Dr. Amedeo Plenty who will contact [facility] to discuss options. I am worried he is developing signs/sx of worsening cholecystitis vs cholangitis and will need to be followed closely.  He has been off Eliquis for almost 2 days. Will likely need to start heparin tomorrow.   Robert Jurich,MD 6:46 PM

## 2016-06-10 NOTE — Progress Notes (Signed)
OT Cancellation Note  Patient Details Name: [name] MRN: [id number] DOB: 11-30-1956   Cancelled Treatment:    Reason Eval/Treat Not Completed: OT screened, no needs identified, will sign off. Spoke with nurse and pt about pt's performance with mobility/transfers. Pt has been ambulating in room with no LOB or observable difficulty per nurse and pt. "Oh, yeah I'm doing all right with that stuff (getting up and moving around) it's just my gallbladder's acting up."   Hortencia Pilar 06/10/2016, 12:38 PM

## 2016-06-11 ENCOUNTER — Inpatient Hospital Stay (HOSPITAL_COMMUNITY): Payer: Medicare Other | Admitting: Certified Registered Nurse Anesthetist

## 2016-06-11 ENCOUNTER — Encounter (HOSPITAL_COMMUNITY): Payer: Self-pay | Admitting: Certified Registered Nurse Anesthetist

## 2016-06-11 ENCOUNTER — Encounter (HOSPITAL_COMMUNITY)
Admission: EM | Disposition: A | Discharge status: Home / Self Care | Payer: Self-pay | Source: Home / Self Care | Attending: Internal Medicine

## 2016-06-11 ENCOUNTER — Other Ambulatory Visit: Payer: Self-pay | Admitting: *Deleted

## 2016-06-11 ENCOUNTER — Inpatient Hospital Stay (HOSPITAL_COMMUNITY): Payer: Medicare Other

## 2016-06-11 DIAGNOSIS — K831 Obstruction of bile duct: Secondary | ICD-10-CM | POA: Diagnosis present

## 2016-06-11 DIAGNOSIS — N179 Acute kidney failure, unspecified: Secondary | ICD-10-CM | POA: Diagnosis present

## 2016-06-11 HISTORY — PX: ERCP: SHX5425

## 2016-06-11 LAB — COMPREHENSIVE METABOLIC PANEL
ALT: 152 U/L — ABNORMAL HIGH (ref 17–63)
ANION GAP: 19 — AB (ref 5–15)
AST: 208 U/L — AB (ref 15–41)
Albumin: 2.3 g/dL — ABNORMAL LOW (ref 3.5–5.0)
Alkaline Phosphatase: 317 U/L — ABNORMAL HIGH (ref 38–126)
BILIRUBIN TOTAL: 9.1 mg/dL — AB (ref 0.3–1.2)
BUN: 49 mg/dL — AB (ref 6–20)
CHLORIDE: 95 mmol/L — AB (ref 101–111)
CO2: 20 mmol/L — ABNORMAL LOW (ref 22–32)
Calcium: 9.5 mg/dL (ref 8.9–10.3)
Creatinine, Ser: 3.98 mg/dL — ABNORMAL HIGH (ref 0.61–1.24)
GFR calc Af Amer: 17 mL/min — ABNORMAL LOW (ref >60)
GFR, EST NON AFRICAN AMERICAN: 15 mL/min — AB (ref >60)
Glucose, Bld: 75 mg/dL (ref 65–99)
POTASSIUM: 3.5 mmol/L (ref 3.5–5.1)
Sodium: 134 mmol/L — ABNORMAL LOW (ref 135–145)
TOTAL PROTEIN: 7.6 g/dL (ref 6.5–8.1)

## 2016-06-11 LAB — GLUCOSE, CAPILLARY
GLUCOSE-CAPILLARY: 215 mg/dL — AB (ref 65–99)
Glucose-Capillary: 167 mg/dL — ABNORMAL HIGH (ref 65–99)
Glucose-Capillary: 268 mg/dL — ABNORMAL HIGH (ref 65–99)
Glucose-Capillary: 88 mg/dL (ref 65–99)
Glucose-Capillary: 88 mg/dL (ref 65–99)
Glucose-Capillary: 90 mg/dL (ref 65–99)

## 2016-06-11 LAB — CBC
HCT: 38.9 % — ABNORMAL LOW (ref 39.0–52.0)
Hemoglobin: 13.2 g/dL (ref 13.0–17.0)
MCH: 32.4 pg (ref 26.0–34.0)
MCHC: 33.9 g/dL (ref 30.0–36.0)
MCV: 95.6 fL (ref 78.0–100.0)
PLATELETS: 141 10*3/uL — AB (ref 150–400)
RBC: 4.07 MIL/uL — ABNORMAL LOW (ref 4.22–5.81)
RDW: 13 % (ref 11.5–15.5)
WBC: 33.7 10*3/uL — AB (ref 4.0–10.5)

## 2016-06-11 LAB — LIPASE, BLOOD: Lipase: 396 U/L — ABNORMAL HIGH (ref 11–51)

## 2016-06-11 LAB — VANCOMYCIN, RANDOM: Vancomycin Rm: 11

## 2016-06-11 SURGERY — ERCP, WITH INTERVENTION IF INDICATED
Anesthesia: General

## 2016-06-11 MED ORDER — SODIUM CHLORIDE 0.9 % IV SOLN
INTRAVENOUS | Status: DC
  Administered 2016-06-11: 250 mL via INTRAVENOUS

## 2016-06-11 MED ORDER — IOPAMIDOL (ISOVUE-300) INJECTION 61%
INTRAVENOUS | Status: AC
  Filled 2016-06-11: qty 50

## 2016-06-11 MED ORDER — SUCCINYLCHOLINE CHLORIDE 20 MG/ML IJ SOLN
INTRAMUSCULAR | Status: DC | PRN
  Administered 2016-06-11: 100 mg via INTRAVENOUS

## 2016-06-11 MED ORDER — ONDANSETRON HCL 4 MG/2ML IJ SOLN
INTRAMUSCULAR | Status: DC | PRN
  Administered 2016-06-11: 4 mg via INTRAVENOUS

## 2016-06-11 MED ORDER — FENTANYL CITRATE (PF) 100 MCG/2ML IJ SOLN
INTRAMUSCULAR | Status: DC | PRN
  Administered 2016-06-11 (×2): 50 ug via INTRAVENOUS

## 2016-06-11 MED ORDER — KETAMINE HCL 100 MG/ML IJ SOLN
INTRAMUSCULAR | Status: AC
  Filled 2016-06-11: qty 1

## 2016-06-11 MED ORDER — ETOMIDATE 2 MG/ML IV SOLN
INTRAVENOUS | Status: DC | PRN
  Administered 2016-06-11: 8 mg via INTRAVENOUS

## 2016-06-11 MED ORDER — FENTANYL CITRATE (PF) 250 MCG/5ML IJ SOLN
INTRAMUSCULAR | Status: AC
  Filled 2016-06-11: qty 5

## 2016-06-11 MED ORDER — PHENYLEPHRINE HCL 10 MG/ML IJ SOLN
INTRAMUSCULAR | Status: DC | PRN
  Administered 2016-06-11: 40 ug via INTRAVENOUS
  Administered 2016-06-11: 80 ug via INTRAVENOUS
  Administered 2016-06-11: 40 ug via INTRAVENOUS
  Administered 2016-06-11 (×3): 80 ug via INTRAVENOUS

## 2016-06-11 MED ORDER — MIDAZOLAM HCL 5 MG/5ML IJ SOLN
INTRAMUSCULAR | Status: DC | PRN
  Administered 2016-06-11: 2 mg via INTRAVENOUS

## 2016-06-11 MED ORDER — MIDAZOLAM HCL 2 MG/2ML IJ SOLN
INTRAMUSCULAR | Status: AC
  Filled 2016-06-11: qty 2

## 2016-06-11 MED ORDER — DEXAMETHASONE SODIUM PHOSPHATE 10 MG/ML IJ SOLN
INTRAMUSCULAR | Status: DC | PRN
  Administered 2016-06-11: 10 mg via INTRAVENOUS

## 2016-06-11 MED ORDER — EPHEDRINE SULFATE 50 MG/ML IJ SOLN
INTRAMUSCULAR | Status: DC | PRN
  Administered 2016-06-11: 5 mg via INTRAVENOUS

## 2016-06-11 MED ORDER — KETAMINE HCL 10 MG/ML IJ SOLN
INTRAMUSCULAR | Status: DC | PRN
  Administered 2016-06-11 (×3): 10 mg via INTRAVENOUS

## 2016-06-11 MED ORDER — SODIUM CHLORIDE 0.9 % IV SOLN
INTRAVENOUS | Status: DC | PRN
  Administered 2016-06-11: 30 mL

## 2016-06-11 MED ORDER — EPINEPHRINE HCL 0.1 MG/ML IJ SOSY
PREFILLED_SYRINGE | INTRAMUSCULAR | Status: DC | PRN
  Administered 2016-06-11 (×2): 5 ug via INTRAVENOUS

## 2016-06-11 MED ORDER — LACTATED RINGERS IV SOLN
INTRAVENOUS | Status: DC
  Administered 2016-06-11: 1000 mL via INTRAVENOUS
  Administered 2016-06-11: 11:00:00 via INTRAVENOUS

## 2016-06-11 NOTE — Anesthesia Preprocedure Evaluation (Addendum)
Anesthesia Evaluation  Patient identified by MRN, date of birth, ID band Patient awake    Reviewed: Allergy & Precautions, NPO status , Patient's Chart, lab work & pertinent test results, reviewed documented beta blocker date and time   History of Anesthesia Complications (+) PONV and history of anesthetic complications  Airway Mallampati: II  TM Distance: >3 FB Neck ROM: Full    Dental  (+) Dental Advisory Given, Poor Dentition, Chipped   Pulmonary sleep apnea and Continuous Positive Airway Pressure Ventilation , pneumonia,    breath sounds clear to auscultation       Cardiovascular hypertension, Pt. on medications and Pt. on home beta blockers (-) angina+ CAD, + Past MI, + Cardiac Stents and +CHF  + dysrhythmias Atrial Fibrillation + pacemaker + Cardiac Defibrillator  Rhythm:Regular Rate:Normal  Echo 05/2016 - Left ventricle: The cavity size was severely dilated. Wall  thickness was increased in a pattern of mild LVH. The estimated ejection fraction was in the range of 10% to 15%. Akinesis of the mid-apicalanteroseptal, lateral, inferior, and apical myocardium. - Ventricular septum: Septal motion showed abnormal function and dyssynergy. - Aortic valve: There was mild regurgitation. - Mitral valve: There was mild regurgitation. - Left atrium: The atrium was mildly dilated. - Right atrium: The atrium was moderately dilated. - Tricuspid valve: There was moderate regurgitation. - Pulmonary arteries: Systolic pressure was moderately increased. PA peak pressure: 41 mm Hg (S).  5/16 stress: Findings consistent with ischemia. The left ventricular ejection fraction is severely decreased (<30%).    Defect 1: There is a small defect of moderate severity present in the mid anterior and mid anteroseptal location.    Defect 2: There is a large defect present in the apical anterior, apical septal, apical inferior and apex location.     Neuro/Psych H/o SAH TIA   GI/Hepatic Neg liver ROS, GERD  Medicated and Poorly Controlled,  Endo/Other  diabetes, Type 2, Insulin Dependent  Renal/GU Renal disease     Musculoskeletal   Abdominal   Peds  Hematology  (+) Blood dyscrasia (eliquis), anemia ,   Anesthesia Other Findings   Reproductive/Obstetrics                            Anesthesia Physical  Anesthesia Plan  ASA: IV  Anesthesia Plan: General   Post-op Pain Management:    Induction: Intravenous  Airway Management Planned: Oral ETT  Additional Equipment:   Intra-op Plan:   Post-operative Plan: Extubation in OR  Informed Consent: I have reviewed the patients History and Physical, chart, labs and discussed the procedure including the risks, benefits and alternatives for the proposed anesthesia with the patient or authorized representative who has indicated his/her understanding and acceptance.   Dental advisory given  Plan Discussed with: CRNA  Anesthesia Plan Comments:        Anesthesia Quick Evaluation

## 2016-06-11 NOTE — Progress Notes (Signed)
Pt continues with intermittent hypotension. Pt tolerating. Will continue to monitor.

## 2016-06-11 NOTE — Care Management Important Message (Signed)
Important Message  Patient Details  Name: [name] MRN: [id number] Date of Birth: 1956/08/29   Medicare Important Message Given:  Yes    [name] 06/11/2016, 10:29 AM

## 2016-06-11 NOTE — Progress Notes (Signed)
Advanced Heart Failure Rounding Note  Referring Physician: Dr Lily Kocher PCP: Dr. Gerarda Fraction Oncologist: Dr Benay Spice.  CHF: [name]  Reason for Consultation: Cardiac consideration/clearance for GI surgery.   Subjective:    Admitted 06/09/16 with worsening ABD pain + N/V in setting of chronic cholecystitis.   Total bili 3.2 -> 6.4 -> 9.1 AST 654 -> 272 -> 208 ALT 177 -> 206 -> 152 Alk Phos 456 -> 302 -> 317 Lipase 78 -> 396  HIDA scan 06/09/16 with somewhat delayed hepatic uptake of radiotracer. No hepatic excretion of radiotracer. Burtis Junes this is related to hepatic dysfunction, possibly hepatitis. Cannot assess gallbladder filling without biliary excretion. Recommend MRI/MRCP; No MRI with ICD.  ABD [place] 06/09/16 with gallstones, 8 mm CBD stone, and mild wall thickening possibly reflecting cholecystitis.  Repeat US last night with worsening ABD pain showed gallstones up to 2.0 cm with borderline prominent gallbladder wall.   Pain feels better this morning, although still present.  He isn't completely sure if he is getting ERCP vs Perc drain today, although notes say he is scheduled for ERCP later this am.  No SOB or CP.   Weight stable. Creatinine 2.70 -> 2.88 -> 3.98  Objective:   Weight Range: 178 lb 14.4 oz (81.149 kg) Body mass index is 25.67 kg/(m^2).   Vital Signs:   Temp:  [97.8 F (36.6 C)-99.7 F (37.6 C)] 97.9 F (36.6 C) (07/07 0628) Pulse Rate:  [69-88] 70 (07/07 0628) Resp:  [18] 18 (07/06 1104) BP: (80-126)/(42-64) 80/42 mmHg (07/07 0628) SpO2:  [95 %-97 %] 97 % (07/07 0628) Weight:  [178 lb 14.4 oz (81.149 kg)] 178 lb 14.4 oz (81.149 kg) (07/07 0436) Last BM Date: 06/09/16  Weight change: Filed Weights   06/09/16 1036 06/10/16 0425 06/11/16 0436  Weight: 181 lb 6.4 oz (82.283 kg) 180 lb 1.6 oz (81.693 kg) 178 lb 14.4 oz (81.149 kg)    Intake/Output:   Intake/Output Summary (Last 24 hours) at 06/11/16 0906 Last data filed at 06/11/16 0511  Gross per  24 hour  Intake    540 ml  Output    750 ml  Net   -210 ml     Physical Exam: General: NAD, HEENT: Scleral icterus, mild jaundice Neck: JVP ~7-8 cm. no thyromegaly or lymphadenopathy nodule.  No carotid bruit.  Lungs: CTAB, normal effort.  CV: Nondisplaced PMI. Regular S1S2, 2/6 SEM RUSB.  Abdomen: Soft, RUQ tenderness, ND, no HSM appreciated. S/p colostomy.  Skin: Intact without lesions or rashes.  Neurologic: Alert and oriented x 3.  Psych: Normal affect. Extremities: No clubbing or cyanosis. [name] with ? Vasculitic/cellulitic changes with no obvious lesion, healing. No peripheral edema.Normal pedal pulses.    Telemetry: Reviewed personally, Vpaced rhythm 70s  Labs: CBC  Recent Labs  06/10/16 0246 06/11/16 0416  WBC 12.4* 33.7*  HGB 13.5 13.2  HCT 40.0 38.9*  MCV 93.7 95.6  PLT 161 423*   Basic Metabolic Panel  Recent Labs  06/10/16 0246 06/11/16 0416  NA 134* 134*  K 4.0 3.5  CL 94* 95*  CO2 31 20*  GLUCOSE 188* 75  BUN 41* 49*  CREATININE 2.88* 3.98*  CALCIUM 10.2 9.5  MG 2.2  --    Liver Function Tests  Recent Labs  06/10/16 0246 06/11/16 0416  AST 272* 208*  ALT 206* 152*  ALKPHOS 302* 317*  BILITOT 6.4* 9.1*  PROT 7.7 7.6  ALBUMIN 2.5* 2.3*    Recent Labs  06/09/16 0759 06/11/16 0732  LIPASE 78* 396*   Cardiac Enzymes  Recent Labs  06/09/16 1041 06/09/16 1757 06/09/16 2153  TROPONINI 0.04* 0.04* 0.05*    BNP: BNP (last 3 results)  Recent Labs  09/26/15 1820 05/09/16 2343 06/01/16 1200  BNP 1393.4* 377.6* 1016.7*    ProBNP (last 3 results) No results for input(s): PROBNP in the last 8760 hours.   D-Dimer No results for input(s): DDIMER in the last 72 hours. Hemoglobin A1C  Recent Labs  06/09/16 1041  HGBA1C 11.2*   Fasting Lipid Panel No results for input(s): CHOL, HDL, LDLCALC, TRIG, CHOLHDL, LDLDIRECT in the last 72 hours. Thyroid Function Tests No results for input(s): TSH, T4TOTAL, T3FREE,  THYROIDAB in the last 72 hours.  Invalid input(s): FREET3  Other results:     Imaging/Studies:  Nm Hepato W/eject Fract  06/09/2016  CLINICAL DATA:  Right upper quadrant pain. Nausea, vomiting. Cholelithiasis. Elevated LFTs. EXAM: NUCLEAR MEDICINE HEPATOBILIARY IMAGING TECHNIQUE: Sequential images of the abdomen were obtained out to 60 minutes following intravenous administration of radiopharmaceutical. RADIOPHARMACEUTICALS:  5.1 mCi Tc-10m Choletec IV COMPARISON:  Ultrasound performed today. Ultrasound 05/10/2016. CT 05/05/2016. FINDINGS: There is uptake of radiotracer by the liver. This is somewhat delayed as there is still some activity seen within the heart out to 2 hours. There is no excretion of radiotracer by the liver. Nonvisualization of the biliary system out to 2 hours. IMPRESSION: Somewhat delayed hepatic uptake of radiotracer. No hepatic excretion of radiotracer. FBurtis Junesthis is related to hepatic dysfunction, possibly hepatitis. Cannot assess gallbladder filling without biliary excretion. Given the findings on recent ultrasound, recommend further evaluation of the biliary system with MRI and MRCP. Electronically Signed   By: KRolm BaptiseM.D.   On: 06/09/2016 16:54   [place]domen Limited  06/10/2016  CLINICAL DATA:  Acute onset of generalized abdominal pain and hyperbilirubinemia. Initial encounter. EXAM: [place] LIMITED - RIGHT UPPER QUADRANT COMPARISON:  Right upper quadrant ultrasound performed 06/09/2016 FINDINGS: Gallbladder: Stones within the gallbladder measure up to 2.0 cm in size. The gallbladder wall is borderline prominent. No pericholecystic fluid is seen. No sonographic Murphy sign noted by sonographer. Common bile duct: Diameter: 0.9 cm, dilated in appearance. Liver: No focal lesion identified. Mildly nodular contour, with mild heterogeneity, raising concern for mild cirrhosis. IMPRESSION: 1. Cholelithiasis. Borderline prominence of the gallbladder wall, and dilatation of the  common bile duct to 0.9 cm, raising concern for distal obstruction. Would correlate with LFTs, and consider MRCP or ERCP for further evaluation. 2. Findings suggest mild hepatic cirrhosis. Electronically Signed   By: JGarald BaldingM.D.   On: 06/10/2016 23:27    Latest Echo  Latest Cath   Medications:     Scheduled Medications: . atorvastatin  80 mg Oral Daily  . ferrous sulfate  325 mg Oral BID WC  . insulin aspart  0-15 Units Subcutaneous TID WC  . insulin glargine  15 Units Subcutaneous BID  . metoprolol succinate  12.5 mg Oral BID  . pantoprazole  40 mg Oral Daily  . piperacillin-tazobactam (ZOSYN)  IV  3.375 g Intravenous Q8H  . potassium chloride SA  20 mEq Oral Daily  . sodium chloride flush  3 mL Intravenous Q12H  . torsemide  80 mg Oral q morning - 10a   And  . torsemide  40 mg Oral QPM  . vancomycin  750 mg Intravenous Q24H    Infusions: . sodium chloride 250 mL (06/11/16 0746)    PRN Medications: bisacodyl, HYDROcodone-acetaminophen, magnesium citrate, morphine  injection, morphine injection, ondansetron **OR** ondansetron (ZOFRAN) IV, senna-docusate, traZODone   Assessment   1. Acute on chronic cholecystitis 2. Chronic systolic CHF - ICM - Echo 05/11/16 with LVEF 10-15%, mild AI, mild MR, normal RV.  3. Chronic Afib with CHADS2VASC score of 5. S/p AV node ablation 4. CAD s/p NSTEMI in 04/20/16 5. CKD stage IV s/p AVF in RUE 6. h/o rectal cancer 2008: Has diverting colostomy. He has follow up with Dr Benay Spice. - Recent CT (05/02/16) with no evidence of recurrence. 7. HLD.  Plan    Pt at moderate to high risk for surgery with low EF and active cholecystitis.   Hold torsemide for now with marked creatinine bump 2.8 -> 3.9. Will need to watch volume closely. Especially if requires IVF.   Repeat US 06/10/16 with significant gallstones. WBC and Lipase worsening. For tentative ERCP this am.   Clinical picture worsening with markedly elevated Creatinine, WBC up to  33, and hypotension.  Currently on Vanc/Zosyn. Concern for worsening cholecystits vs cholangitis.  We will continue to follow closely.   Will likely need to start heparin bridge s/p procedure today.   Satira Mccallum Tillery PA-C 06/11/2016, 9:06 AM  Advanced Heart Failure Team Pager [phone] (M-F; 7a - 4p)  Please contact [facility] for night-coverage after hours (4p -7a ) and weekends on [email or web address]  Patient seen and examined with Oda Kilts, PA-C. We discussed all aspects of the encounter. I agree with the assessment and plan as stated above.   He looks better today after ERCP and sphincterotomy. WBC and lipase markedly elevated overnight. Now jaundiced. Creatinine up. Agree with holding diuretics. May need some IVF. Off apixaban. Consider starting IV heparin tomorrow for AF. HF team will continue to follow.   [name], Daniel,[name] 4:03 PM

## 2016-06-11 NOTE — Progress Notes (Signed)
Shin near bandage on left elbow bleeding,skin tear noted near bandage,site cleaned and reinforced.

## 2016-06-11 NOTE — Progress Notes (Signed)
Inpatient Diabetes Program Recommendations  AACE/ADA: New Consensus Statement on Inpatient Glycemic Control (2015)  Target Ranges:  Prepandial:   less than 140 mg/dL      Peak postprandial:   less than 180 mg/dL (1-2 hours)      Critically ill patients:  140 - 180 mg/dL   Lab Results  Component Value Date   GLUCAP 167* 06/11/2016   HGBA1C 11.2* 06/09/2016   Results for IKENNA, SAMRA (MRN [id number]) as of 06/11/2016 15:58  Ref. Range 06/10/2016 20:01 06/11/2016 00:09 06/11/2016 03:32 06/11/2016 08:06 06/11/2016 13:31  Glucose-Capillary Latest Ref Range: 65-99 mg/dL 115 (H) 88 90 88 167 (H)   Review of Glycemic Control  Diabetes history: DM2 Outpatient Diabetes medications: Lantus 30 units QAM Current orders for Inpatient glycemic control: Novolog moderate tidwc  Spoke with pt regarding his diabetes control. Pt states he has no problems in obtaining his insulin and supplies. States he knows what to do, but hasn't been checking blood sugars lately.   *See progress note dated 05/11/2016 by diabetes coordinator.  Recommend Lantus 15 units QHS. Titrate until FBS < 180 mg/dL.  Will continue to follow. Thank you. Lorenda Peck, RD, LDN, CDE Inpatient Diabetes Coordinator [phone]

## 2016-06-11 NOTE — Progress Notes (Signed)
PROGRESS NOTE  [name]  XBJ:[id number] DOB: 1956-07-21 DOA: 06/09/2016 PCP: Cassell Smiles., MD Outpatient Specialists:  Subjective: Low-grade fever of 99.7 last night, WBC increase from 12 to 33.7. Taken for ERCP today.  Brief Narrative:  RUQ abdominal pain, has history of severe ischemic cardiomyopathy and rectal cancer status post partial colectomy and colostomy.  Assessment & Plan:   Active Problems:   History of rectal cancer   Ischemic cardiomyopathy-EF 35% 04/24/15 echo   CAD with LCX disease and stents to LAD   Chronic systolic heart failure (HCC)   Paroxysmal atrial fibrillation (HCC)   DM type 2, uncontrolled, with renal complications (HCC)   Gallbladder polyp   GERD (gastroesophageal reflux disease)   Orthostatic hypotension   OSA on CPAP   Hyperlipidemia   Transaminitis   Anemia of chronic disease   Right heart failure (HCC)   Cholecystitis    RUQ pain likely acute on chronic cholecystitis.  -Patient readmitted with same issues 6/5, not a surgical candidate at the time due to myriad of cardiac issues .  -Abd [place] today shows Gallstones and mild gallbladder wall thickening possibly reflecting cholecystitis. No pericholecystic fluid or positive sonographic Murphy's sign. Mild dilation of the common bile duct without evidence of commonduct stones.  -Total bilirubin 3.2. AST 354 ALT 177 lipase 78.  -HIDA scan done and showed indeterminate results secondary to lack of gallbladder filling. -General surgery recommended percutaneous drainage, IR reported with non-distended gallbladder 8 will be very difficult to place percutaneous drain. -ERCP done earlier today by Dr. Madilyn Fireman, with sphincterotomy and placement of CBD stent.  Likely early sepsis -WBC of 33.7, mild hypertension and likely acute cholecystitis. -Patient is on Zosyn and vancomycin, blood cultures obtained.  Acute kidney injury on CKD stage IV baseline creatinine 2.6, currently at 2.7  -Was on  diuretics at home, creatinine jumped from 2.8 to 3.98 overnight. The medics held by cardiology. -Has a R AVF if needed for HD. -Looksdehydrated, I will let cardiology decide if they need to give him trickle IV fluids.  Atrial Fibrillation CHA2DS2-VASc score 3, on anticoagulation with Eliquis -Rate is controlled, home medications continued -Eliquis on hold.  Chronic systolic heart failure, last echocardiogram 6/6 with Severely dilated cavity, mild LVH, EF 10-15 % Dry weight 82 kg, current weight 82.2 . Appears compensated.  - Daily weights, strict I/O  CAD, Tn 0.01 EKG Shows paced rhythm , last cardiac catheterization with stent in 2015, patient is cardiac pain free at this time. Continue meds   History of Hypotension stable for now BP 104/55 mmHg  Pulse 75  IVF on hold in the setting of severe CHF /Afib with risk of volume overload  Continue to monitor   Type II Diabetes Current blood sugar level is Glucose 445, Anion Gap 15, corrected 16.25. Hold home oral diabetic medications.  Lantus 1/2 dose bid, SSI (moderate) Accucheck q 4 h NPO for now, can advance to carb modified diet when ok with Surgery.  OSA on CPAP/COPD, not on acute exacerbation, stable  Continue CPAP and O2  Leukocytosis, likely reactive due to cholecystitis, pain, inflammation versus underlying infection versus possible cellulitic changes seen on exam on the R lower extremity. WBC 12.1 CXR without acute disease. Afebrile  Plays and Zosyn.   GERD, no acute symptoms: Continue PPI  History of Rectal Cancer, s/p chemo/ XRT No acute changes. Can follow as OP  Consult to Left colostomy care   Elevated lipase -Acute pancreatitis versus elevated lipase from acute renal  failure. -Anyway patient is nothing by mouth, if pain not improved can obtain CT of abdomen/pelvis.  DVT prophylaxis: SCDs Code Status: Full Code Family Communication:  Disposition Plan:  Diet: Diet NPO time  specified  Consultants:   None  Procedures:   None  Antimicrobials:   Zosyn  Objective: Filed Vitals:   06/11/16 1300 06/11/16 1305 06/11/16 1310 06/11/16 1318  BP:  81/42  81/42  Pulse: 73 77 69   Temp:      TempSrc:      Resp: 12 11 12    Height:      Weight:      SpO2: 97% 96% 98%     Intake/Output Summary (Last 24 hours) at 06/11/16 1319 Last data filed at 06/11/16 1204  Gross per 24 hour  Intake   1240 ml  Output    750 ml  Net    490 ml   Filed Weights   06/09/16 1036 06/10/16 0425 06/11/16 0436  Weight: 82.283 kg (181 lb 6.4 oz) 81.693 kg (180 lb 1.6 oz) 81.149 kg (178 lb 14.4 oz)    Examination: General exam: Appears calm and comfortable  Respiratory system: Clear to auscultation. Respiratory effort normal. Cardiovascular system: S1 & S2 heard, RRR. No JVD, murmurs, rubs, gallops or clicks. No pedal edema. Gastrointestinal system: Abdomen is nondistended, soft and nontender. No organomegaly or masses felt. Normal bowel sounds heard. Central nervous system: Alert and oriented. No focal neurological deficits. Extremities: Symmetric 5 x 5 power. Skin: No rashes, lesions or ulcers Psychiatry: Judgement and insight appear normal. Mood & affect appropriate.   Data Reviewed: I have personally reviewed following labs and imaging studies  CBC:  Recent Labs Lab 06/09/16 0715 06/10/16 0246 06/11/16 0416  WBC 12.1* 12.4* 33.7*  HGB 15.4 13.5 13.2  HCT 45.4 40.0 38.9*  MCV 95.8 93.7 95.6  PLT 206 161 141*   Basic Metabolic Panel:  Recent Labs Lab 06/09/16 0759 06/10/16 0246 06/11/16 0416  NA 131* 134* 134*  K 3.8 4.0 3.5  CL 90* 94* 95*  CO2 26 31 20*  GLUCOSE 445* 188* 75  BUN 41* 41* 49*  CREATININE 2.70* 2.88* 3.98*  CALCIUM 10.6* 10.2 9.5  MG  --  2.2  --    GFR: Estimated Creatinine Clearance: 20.1 mL/min (by C-G formula based on Cr of 3.98). Liver Function Tests:  Recent Labs Lab 06/09/16 0759 06/10/16 0246 06/11/16 0416  AST  354* 272* 208*  ALT 177* 206* 152*  ALKPHOS 456* 302* 317*  BILITOT 3.2* 6.4* 9.1*  PROT 10.2* 7.7 7.6  ALBUMIN 3.5 2.5* 2.3*    Recent Labs Lab 06/09/16 0759 06/11/16 0732  LIPASE 78* 396*   No results for input(s): AMMONIA in the last 168 hours. Coagulation Profile:  Recent Labs Lab 06/09/16 1041 06/10/16 0246  INR 1.27 1.60*   Cardiac Enzymes:  Recent Labs Lab 06/09/16 1041 06/09/16 1757 06/09/16 2153  TROPONINI 0.04* 0.04* 0.05*   BNP (last 3 results) No results for input(s): PROBNP in the last 8760 hours. HbA1C:  Recent Labs  06/09/16 1041  HGBA1C 11.2*   CBG:  Recent Labs Lab 06/10/16 1633 06/10/16 2001 06/11/16 0009 06/11/16 0332 06/11/16 0806  GLUCAP 131* 115* 88 90 88   Lipid Profile: No results for input(s): CHOL, HDL, LDLCALC, TRIG, CHOLHDL, LDLDIRECT in the last 72 hours. Thyroid Function Tests: No results for input(s): TSH, T4TOTAL, FREET4, T3FREE, THYROIDAB in the last 72 hours. Anemia Panel: No results for input(s): VITAMINB12, FOLATE, FERRITIN,  TIBC, IRON, RETICCTPCT in the last 72 hours. Urine analysis:    Component Value Date/Time   COLORURINE YELLOW 06/09/2016 1023   APPEARANCEUR CLEAR 06/09/2016 1023   LABSPEC 1.017 06/09/2016 1023   PHURINE 5.5 06/09/2016 1023   GLUCOSEU >1000* 06/09/2016 1023   HGBUR NEGATIVE 06/09/2016 1023   BILIRUBINUR NEGATIVE 06/09/2016 1023   KETONESUR NEGATIVE 06/09/2016 1023   PROTEINUR NEGATIVE 06/09/2016 1023   UROBILINOGEN 1.0 08/23/2015 0322   NITRITE NEGATIVE 06/09/2016 1023   LEUKOCYTESUR NEGATIVE 06/09/2016 1023   Sepsis Labs: @LABRCNTIP (procalcitonin:4,lacticidven:4)  )No results found for this or any previous visit (from the past 240 hour(s)).   Invalid input(s): PROCALCITONIN, LACTICACIDVEN   Radiology Studies: Nm Hepato W/eject Fract  06/09/2016  CLINICAL DATA:  Right upper quadrant pain. Nausea, vomiting. Cholelithiasis. Elevated LFTs. EXAM: NUCLEAR MEDICINE HEPATOBILIARY  IMAGING TECHNIQUE: Sequential images of the abdomen were obtained out to 60 minutes following intravenous administration of radiopharmaceutical. RADIOPHARMACEUTICALS:  5.1 mCi Tc-72m  Choletec IV COMPARISON:  Ultrasound performed today. Ultrasound 05/10/2016. CT 05/05/2016. FINDINGS: There is uptake of radiotracer by the liver. This is somewhat delayed as there is still some activity seen within the heart out to 2 hours. There is no excretion of radiotracer by the liver. Nonvisualization of the biliary system out to 2 hours. IMPRESSION: Somewhat delayed hepatic uptake of radiotracer. No hepatic excretion of radiotracer. Legrand Rams this is related to hepatic dysfunction, possibly hepatitis. Cannot assess gallbladder filling without biliary excretion. Given the findings on recent ultrasound, recommend further evaluation of the biliary system with MRI and MRCP. Electronically Signed   By: Charlett Nose M.D.   On: 06/09/2016 16:54   US Abdomen Limited  06/10/2016  CLINICAL DATA:  Acute onset of generalized abdominal pain and hyperbilirubinemia. Initial encounter. EXAM: US ABDOMEN LIMITED - RIGHT UPPER QUADRANT COMPARISON:  Right upper quadrant ultrasound performed 06/09/2016 FINDINGS: Gallbladder: Stones within the gallbladder measure up to 2.0 cm in size. The gallbladder wall is borderline prominent. No pericholecystic fluid is seen. No sonographic Murphy sign noted by sonographer. Common bile duct: Diameter: 0.9 cm, dilated in appearance. Liver: No focal lesion identified. Mildly nodular contour, with mild heterogeneity, raising concern for mild cirrhosis. IMPRESSION: 1. Cholelithiasis. Borderline prominence of the gallbladder wall, and dilatation of the common bile duct to 0.9 cm, raising concern for distal obstruction. Would correlate with LFTs, and consider MRCP or ERCP for further evaluation. 2. Findings suggest mild hepatic cirrhosis. Electronically Signed   By: Roanna Raider M.D.   On: 06/10/2016 23:27         Scheduled Meds: . [MAR Hold] atorvastatin  80 mg Oral Daily  . [MAR Hold] ferrous sulfate  325 mg Oral BID WC  . [MAR Hold] insulin aspart  0-15 Units Subcutaneous TID WC  . [MAR Hold] insulin glargine  15 Units Subcutaneous BID  . [MAR Hold] metoprolol succinate  12.5 mg Oral BID  . [MAR Hold] pantoprazole  40 mg Oral Daily  . [MAR Hold] piperacillin-tazobactam (ZOSYN)  IV  3.375 g Intravenous Q8H  . [MAR Hold] potassium chloride SA  20 mEq Oral Daily  . [MAR Hold] sodium chloride flush  3 mL Intravenous Q12H  . [MAR Hold] vancomycin  750 mg Intravenous Q24H   Continuous Infusions: . sodium chloride 250 mL (06/11/16 0746)  . lactated ringers 1,000 mL (06/11/16 1036)     LOS: 1 day    Time spent: 35 minutes    Zameria Vogl A, MD [facility] Pager [phone]  If 7PM-7AM, please contact  night-coverage [email or web address] Password [email or web address] 06/11/2016, 1:19 PM

## 2016-06-11 NOTE — Op Note (Signed)
[facility] Patient Name: [name] Procedure Date : 06/11/2016 MRN: [id number] Attending MD: Missy Sabins , MD Date of Birth: 08-18-1956 CSN: [id number] Age: 60 Admit Type: Inpatient Procedure:                ERCP Indications:              Suspected ascending cholangitis Providers:                Elyse Jarvis. Amedeo Plenty, MD, Cleda Daub, RN, William Dalton, Technician Referring MD:              Medicines:                General Anesthesia Complications:            No immediate complications. Estimated Blood Loss:     Estimated blood loss: none. Estimated blood loss                            was minimal. Procedure:                Pre-Anesthesia Assessment:                           - Prior to the procedure, a History and Physical                            was performed, and patient medications and                            allergies were reviewed. The patient's tolerance of                            previous anesthesia was also reviewed. The risks                            and benefits of the procedure and the sedation                            options and risks were discussed with the patient.                            All questions were answered, and informed consent                            was obtained. Prior Anticoagulants: The patient has                            taken Xarelto (rivaroxaban), last dose was 3 days                            prior to procedure. ASA Grade Assessment: IV - A  patient with severe systemic disease that is a                            constant threat to life. After reviewing the risks                            and benefits, the patient was deemed in                            satisfactory condition to undergo the procedure.                           After obtaining informed consent, the scope was                            passed under direct vision. Throughout the                 procedure, the patient's blood pressure, pulse, and                            oxygen saturations were monitored continuously. The                            [id number]) scope was introduced through                            the mouth, and used to inject contrast into and                            used to inject contrast into the dorsal pancreatic                            duct. The ERCP was accomplished without difficulty.                            The patient tolerated the procedure well. Scope In: Scope Out: Findings:      The esophagus was successfully intubated under direct vision. The scope       was advanced to a normal major papilla in the descending duodenum       without detailed examination of the pharynx, larynx and associated       structures, and upper GI tract. The upper GI tract was grossly normal.       The bile duct was deeply cannulated. Contrast was injected.       Opacification of the main bile duct and cystic duct was successful. The       maximum diameter of the ducts was 8 mm. The main bile duct was mildly       dilated and diffusely dilated, uncertain significance. The largest       diameter was 8 mm. The main bile duct contained no stones. A 3 mm       fistula was found in the second portion of the duodenum. A 7 mm biliary       sphincterotomy was made with a traction (standard) sphincterotome using  blended current. The sphincterotomy oozed blood. To discover objects,       the biliary tree was swept [Devices used] [Starting point]. Nothing was       found. One 10 Fr by 5 cm plastic stent with two internal flaps was       placed into the common bile duct. Bile flowed through the stent. The       stent was in good position. Impression:               - Duodenal fistula.                           - The entire main bile duct was mildly dilated,                            uncertain significance. Recommendation:           - Observe  patient's clinical course. Procedure Code(s):        --- Professional ---                           [phone], Endoscopic retrograde                            cholangiopancreatography (ERCP); with placement of                            endoscopic stent into biliary or pancreatic duct,                            including pre- and post-dilation and guide wire                            passage, when performed, including sphincterotomy,                            when performed, each stent Diagnosis Code(s):        --- Professional ---                           K31.6, Fistula of stomach and duodenum                           K83.8, Other specified diseases of biliary tract CPT copyright 2016 [employer]. All rights reserved. The codes documented in this report are preliminary and upon coder review may  be revised to meet current compliance requirements. Missy Sabins, MD 06/11/2016 12:16:23 PM This report has been signed electronically. Number of Addenda: 0

## 2016-06-11 NOTE — Progress Notes (Signed)
Patient ID: [name], male   DOB: [birth year], 60 y.o.   MRN: [id number] Case d/w Dr. Barbie Banner and Dr. Ninfa Linden after ERCP this afternoon.  Given new stent placement, we will see if this helps the patient and his labs and vitals improve.  If he still appears to be ill tomorrow and we are still concerned with the gallbladder, then we may proceed with perc chole drain placement.  NPO p MN, cont to hold eliquis.  Beonka Amesquita E 4:27 PM 06/11/2016

## 2016-06-11 NOTE — Progress Notes (Signed)
Subjective: Complains of RUQ pain, although much less than last evening  Objective: Vital signs in last 24 hours: Temp:  [97.8 F (36.6 C)-99.7 F (37.6 C)] 97.9 F (36.6 C) (07/07 0628) Pulse Rate:  [69-88] 70 (07/07 0628) Resp:  [18] 18 (07/06 1104) BP: (80-126)/(42-64) 80/42 mmHg (07/07 0628) SpO2:  [95 %-97 %] 97 % (07/07 0628) Weight:  [81.149 kg (178 lb 14.4 oz)] 81.149 kg (178 lb 14.4 oz) (07/07 0436) Last BM Date: 06/09/16  Intake/Output from previous day: 07/06 0701 - 07/07 0700 In: 540 [P.O.:240; IV Piggyback:300] Out: 750 [Urine:750] Intake/Output this shift:    Abdomen soft, mildly tender in the RUQ jaundiced  Lab Results:   Recent Labs  06/10/16 0246 06/11/16 0416  WBC 12.4* 33.7*  HGB 13.5 13.2  HCT 40.0 38.9*  PLT 161 141*   BMET  Recent Labs  06/10/16 0246 06/11/16 0416  NA 134* 134*  K 4.0 3.5  CL 94* 95*  CO2 31 20*  GLUCOSE 188* 75  BUN 41* 49*  CREATININE 2.88* 3.98*  CALCIUM 10.2 9.5   PT/INR  Recent Labs  06/09/16 1041 06/10/16 0246  LABPROT 16.1* 19.1*  INR 1.27 1.60*   ABG No results for input(s): PHART, HCO3 in the last 72 hours.  Invalid input(s): PCO2, PO2  Studies/Results: Nm Hepato W/eject Fract  06/09/2016  CLINICAL DATA:  Right upper quadrant pain. Nausea, vomiting. Cholelithiasis. Elevated LFTs. EXAM: NUCLEAR MEDICINE HEPATOBILIARY IMAGING TECHNIQUE: Sequential images of the abdomen were obtained out to 60 minutes following intravenous administration of radiopharmaceutical. RADIOPHARMACEUTICALS:  5.1 mCi Tc-81m  Choletec IV COMPARISON:  Ultrasound performed today. Ultrasound 05/10/2016. CT 05/05/2016. FINDINGS: There is uptake of radiotracer by the liver. This is somewhat delayed as there is still some activity seen within the heart out to 2 hours. There is no excretion of radiotracer by the liver. Nonvisualization of the biliary system out to 2 hours. IMPRESSION: Somewhat delayed hepatic uptake of radiotracer. No  hepatic excretion of radiotracer. Burtis Junes this is related to hepatic dysfunction, possibly hepatitis. Cannot assess gallbladder filling without biliary excretion. Given the findings on recent ultrasound, recommend further evaluation of the biliary system with MRI and MRCP. Electronically Signed   By: Rolm Baptise M.D.   On: 06/09/2016 16:54   US Abdomen Limited  06/10/2016  CLINICAL DATA:  Acute onset of generalized abdominal pain and hyperbilirubinemia. Initial encounter. EXAM: US ABDOMEN LIMITED - RIGHT UPPER QUADRANT COMPARISON:  Right upper quadrant ultrasound performed 06/09/2016 FINDINGS: Gallbladder: Stones within the gallbladder measure up to 2.0 cm in size. The gallbladder wall is borderline prominent. No pericholecystic fluid is seen. No sonographic Murphy sign noted by sonographer. Common bile duct: Diameter: 0.9 cm, dilated in appearance. Liver: No focal lesion identified. Mildly nodular contour, with mild heterogeneity, raising concern for mild cirrhosis. IMPRESSION: 1. Cholelithiasis. Borderline prominence of the gallbladder wall, and dilatation of the common bile duct to 0.9 cm, raising concern for distal obstruction. Would correlate with LFTs, and consider MRCP or ERCP for further evaluation. 2. Findings suggest mild hepatic cirrhosis. Electronically Signed   By: Garald Balding M.D.   On: 06/10/2016 23:27    Anti-infectives: Anti-infectives    Start     Dose/Rate Route Frequency Ordered Stop   06/10/16 2000  vancomycin (VANCOCIN) IVPB 750 mg/150 ml premix     750 mg 150 mL/hr over 60 Minutes Intravenous Every 24 hours 06/10/16 1850     06/09/16 1400  piperacillin-tazobactam (ZOSYN) IVPB 3.375 g  3.375 g 12.5 mL/hr over 240 Minutes Intravenous Every 8 hours 06/09/16 1122     06/09/16 0845  piperacillin-tazobactam (ZOSYN) IVPB 3.375 g     3.375 g 100 mL/hr over 30 Minutes Intravenous  Once 06/09/16 0836 06/09/16 0936      Assessment/Plan: s/p Procedure(s): ENDOSCOPIC RETROGRADE  CHOLANGIOPANCREATOGRAPHY (ERCP) (N/A)  Suspected CBD stone given labs and ultrasound. Needs ERCP Still not a surgical candidate for lap chole from a cardiac standpoint so option limited to perc chole if cholecystitis remains an issue.  LOS: 1 day    Shalandria Elsbernd A 06/11/2016

## 2016-06-11 NOTE — Progress Notes (Signed)
Pharmacy Antibiotic Note  [name] is a 60 y.o. male admitted on 06/09/2016 with cholecystitis.  Pharmacy has been consulted for vancomycin dosing.  Patient with leukocytosis from cholecystitis vs cellulitic changes, suspected early sepsis. SCr jumped from 2.8>3.9 today, however this is only a 30% increase as patient's baseline is 2.6-2.8.  Plan: -Continue Vancomycin 750 IV every 24 hours.  Goal trough 15-20 mcg/mL as there are concerns for sepsis. -Zosyn 3.375g IV q8h (4 hour infusion).  Height: 5\' 10"  (177.8 cm) Weight: 178 lb 14.4 oz (81.149 kg) IBW/kg (Calculated) : 73  Temp (24hrs), Avg:98.2 F (36.8 C), Min:97.7 F (36.5 C), Max:99.7 F (37.6 C)   Recent Labs Lab 06/09/16 0715 06/09/16 0759 06/10/16 0246 06/11/16 0416 06/11/16 2004  WBC 12.1*  --  12.4* 33.7*  --   CREATININE  --  2.70* 2.88* 3.98*  --   VANCORANDOM  --   --   --   --  11    Estimated Creatinine Clearance: 20.1 mL/min (by C-G formula based on Cr of 3.98).    No Known Allergies  Antimicrobials this admission: Zosyn 7/5 >>  Vancomycin 7/6 >>   Dose adjustments this admission: 7/7 VR = 11 after one dose of 750mg   Microbiology results: 7/7 BCx: sent  Thank you for allowing pharmacy to be a part of this patient's care.  Arilla Hice D. Jammie Troup, PharmD, BCPS Clinical Pharmacist Pager: [phone] 06/11/2016 9:11 PM

## 2016-06-11 NOTE — Progress Notes (Addendum)
Increased abdominal pain last night, decision to forego percutaneous cholecystostomy noted.Increased  bilirubin, WBC 33K. Per night nurse, relatively comfortable through the night. Will discuss Rx options with Dr Ninfa Linden. Tentatively set up for ERCP at 11AM. Will check lipase

## 2016-06-11 NOTE — Progress Notes (Signed)
MD called to add-on Lipase to AM labs. Phlebotomy in room drawing blood at this time. Lab ordered STAT. Will continue to monitor.

## 2016-06-11 NOTE — Transfer of Care (Signed)
Immediate Anesthesia Transfer of Care Note  Patient: [name]  Procedure(s) Performed: Procedure(s): ENDOSCOPIC RETROGRADE CHOLANGIOPANCREATOGRAPHY (ERCP) (N/A)  Patient Location: PACU and Endoscopy Unit  Anesthesia Type:General  Level of Consciousness: patient cooperative and responds to stimulation  Airway & Oxygen Therapy: Patient Spontanous Breathing and Patient connected to face mask oxygen  Post-op Assessment: Report given to RN and Post -op Vital signs reviewed and stable  Post vital signs: Reviewed, stable  Last Vitals:  Filed Vitals:   06/11/16 1030 06/11/16 1031  BP:  91/45  Pulse:    Temp:  36.5 C  Resp: 15     Last Pain:  Filed Vitals:   06/11/16 1032  PainSc: 0-No pain      Patients Stated Pain Goal: 2 ([id number])  Complications: No apparent anesthesia complications

## 2016-06-11 NOTE — Progress Notes (Signed)
GI addendum: ERCP revealed a dilated common bile duct, without apparent filling defect.  gallbladder did not fill. Sphincterotomy performed with dark amber bile but no stone extracted with balloon sweeps, 5 cm, 10 French plastic stent placed. There appeared to be a small fistulous opening 3 cm proximal to the major papilla. This did not appear to be the minor papilla. It was probed freely with the sphincterotome and cannot be certain by fluoroscopy where it was going. One dye injection resulted in an amorphous dye collection, did not appear to fill a duct. Could possibly be a very small mouth diverticulum but cannot rule out an abnormal fistulous tract with unknown communication. Findings discussed with Dr. Ninfa Linden.

## 2016-06-11 NOTE — Progress Notes (Signed)
Patient with worsening labs and vital signs today.  Plan for ERCP given dilated CBD on [place] and significant increase in TB and WBC.  We will closely follow as if ERCP doesn't help, then we may need to have images re-evaluated for possible perc chole.    [name] E 9:50 AM 06/11/2016

## 2016-06-12 DIAGNOSIS — I480 Paroxysmal atrial fibrillation: Secondary | ICD-10-CM

## 2016-06-12 LAB — HEPARIN LEVEL (UNFRACTIONATED): HEPARIN UNFRACTIONATED: 0.46 [IU]/mL (ref 0.30–0.70)

## 2016-06-12 LAB — CBC
HCT: 35.1 % — ABNORMAL LOW (ref 39.0–52.0)
HEMATOCRIT: 37.4 % — AB (ref 39.0–52.0)
HEMOGLOBIN: 11.8 g/dL — AB (ref 13.0–17.0)
Hemoglobin: 12.5 g/dL — ABNORMAL LOW (ref 13.0–17.0)
MCH: 31.9 pg (ref 26.0–34.0)
MCH: 31.6 pg (ref 26.0–34.0)
MCHC: 33.6 g/dL (ref 30.0–36.0)
MCHC: 33.4 g/dL (ref 30.0–36.0)
MCV: 94.9 fL (ref 78.0–100.0)
MCV: 94.7 fL (ref 78.0–100.0)
Platelets: 86 10*3/uL — ABNORMAL LOW (ref 150–400)
Platelets: 90 10*3/uL — ABNORMAL LOW (ref 150–400)
RBC: 3.7 MIL/uL — AB (ref 4.22–5.81)
RBC: 3.95 MIL/uL — ABNORMAL LOW (ref 4.22–5.81)
RDW: 13.2 % (ref 11.5–15.5)
RDW: 13.2 % (ref 11.5–15.5)
WBC: 22.3 10*3/uL — ABNORMAL HIGH (ref 4.0–10.5)
WBC: 21.8 10*3/uL — AB (ref 4.0–10.5)

## 2016-06-12 LAB — COMPREHENSIVE METABOLIC PANEL
ALT: 176 U/L — ABNORMAL HIGH (ref 17–63)
ANION GAP: 18 — AB (ref 5–15)
AST: 285 U/L — ABNORMAL HIGH (ref 15–41)
Albumin: 2 g/dL — ABNORMAL LOW (ref 3.5–5.0)
Alkaline Phosphatase: 212 U/L — ABNORMAL HIGH (ref 38–126)
BUN: 69 mg/dL — ABNORMAL HIGH (ref 6–20)
CALCIUM: 8.5 mg/dL — AB (ref 8.9–10.3)
CHLORIDE: 93 mmol/L — AB (ref 101–111)
CO2: 20 mmol/L — AB (ref 22–32)
Creatinine, Ser: 4.76 mg/dL — ABNORMAL HIGH (ref 0.61–1.24)
GFR calc non Af Amer: 12 mL/min — ABNORMAL LOW (ref >60)
GFR, EST AFRICAN AMERICAN: 14 mL/min — AB (ref >60)
Glucose, Bld: 326 mg/dL — ABNORMAL HIGH (ref 65–99)
Potassium: 4.9 mmol/L (ref 3.5–5.1)
SODIUM: 131 mmol/L — AB (ref 135–145)
Total Bilirubin: 8.9 mg/dL — ABNORMAL HIGH (ref 0.3–1.2)
Total Protein: 7.1 g/dL (ref 6.5–8.1)

## 2016-06-12 LAB — GLUCOSE, CAPILLARY
GLUCOSE-CAPILLARY: 305 mg/dL — AB (ref 65–99)
GLUCOSE-CAPILLARY: 314 mg/dL — AB (ref 65–99)
GLUCOSE-CAPILLARY: 342 mg/dL — AB (ref 65–99)
GLUCOSE-CAPILLARY: 376 mg/dL — AB (ref 65–99)
Glucose-Capillary: 338 mg/dL — ABNORMAL HIGH (ref 65–99)
Glucose-Capillary: 314 mg/dL — ABNORMAL HIGH (ref 65–99)

## 2016-06-12 MED ORDER — SODIUM CHLORIDE 0.9 % IV BOLUS (SEPSIS)
500.0000 mL | Freq: Once | INTRAVENOUS | Status: AC
Start: 2016-06-12 — End: 2016-06-12
  Administered 2016-06-12: 500 mL via INTRAVENOUS

## 2016-06-12 MED ORDER — DEXTROSE-NACL 5-0.45 % IV SOLN
INTRAVENOUS | Status: AC
  Administered 2016-06-12: 22:00:00 via INTRAVENOUS

## 2016-06-12 MED ORDER — HEPARIN (PORCINE) IN NACL 100-0.45 UNIT/ML-% IJ SOLN
1200.0000 [IU]/h | INTRAMUSCULAR | Status: DC
  Administered 2016-06-12: 800 [IU]/h via INTRAVENOUS
  Administered 2016-06-15: 600 [IU]/h via INTRAVENOUS
  Administered 2016-06-17: 1050 [IU]/h via INTRAVENOUS
  Administered 2016-06-19 (×2): 1200 [IU]/h via INTRAVENOUS
  Filled 2016-06-12 (×8): qty 250

## 2016-06-12 MED ORDER — INSULIN GLARGINE 100 UNIT/ML ~~LOC~~ SOLN
15.0000 [IU] | Freq: Every day | SUBCUTANEOUS | Status: DC
  Administered 2016-06-12 – 2016-06-20 (×7): 15 [IU] via SUBCUTANEOUS
  Filled 2016-06-12 (×10): qty 0.15

## 2016-06-12 MED ORDER — INSULIN ASPART 100 UNIT/ML ~~LOC~~ SOLN
4.0000 [IU] | Freq: Once | SUBCUTANEOUS | Status: AC
  Administered 2016-06-12: 4 [IU] via SUBCUTANEOUS

## 2016-06-12 NOTE — Progress Notes (Signed)
1 Day Post-Op  Subjective: He reports minimal abdominal pain this morning and generally feels better  Objective: Vital signs in last 24 hours: Temp:  [97.7 F (36.5 C)-98.2 F (36.8 C)] 97.7 F (36.5 C) (07/08 0730) Pulse Rate:  [69-77] 73 (07/08 0730) Resp:  [9-20] 20 (07/08 0730) BP: (77-113)/(40-60) 95/60 mmHg (07/08 0730) SpO2:  [96 %-100 %] 97 % (07/08 0730) Weight:  [82.101 kg (181 lb)] 82.101 kg (181 lb) (07/08 0500) Last BM Date: 06/12/16  Intake/Output from previous day: 07/07 0701 - 07/08 0700 In: 840 [P.O.:90; I.V.:700; IV Piggyback:50] Out: 350 [Urine:350] Intake/Output this shift:    Abdomen is soft with minimal tenderness and almost no guarding  Lab Results:   Recent Labs  06/11/16 0416 06/12/16 0253  WBC 33.7* 22.3*  HGB 13.2 11.8*  HCT 38.9* 35.1*  PLT 141* 86*   BMET  Recent Labs  06/11/16 0416 06/12/16 0253  NA 134* 131*  K 3.5 4.9  CL 95* 93*  CO2 20* 20*  GLUCOSE 75 326*  BUN 49* 69*  CREATININE 3.98* 4.76*  CALCIUM 9.5 8.5*   PT/INR  Recent Labs  06/09/16 1041 06/10/16 0246  LABPROT 16.1* 19.1*  INR 1.27 1.60*   ABG No results for input(s): PHART, HCO3 in the last 72 hours.  Invalid input(s): PCO2, PO2  Studies/Results: US Abdomen Limited  06/10/2016  CLINICAL DATA:  Acute onset of generalized abdominal pain and hyperbilirubinemia. Initial encounter. EXAM: US ABDOMEN LIMITED - RIGHT UPPER QUADRANT COMPARISON:  Right upper quadrant ultrasound performed 06/09/2016 FINDINGS: Gallbladder: Stones within the gallbladder measure up to 2.0 cm in size. The gallbladder wall is borderline prominent. No pericholecystic fluid is seen. No sonographic Murphy sign noted by sonographer. Common bile duct: Diameter: 0.9 cm, dilated in appearance. Liver: No focal lesion identified. Mildly nodular contour, with mild heterogeneity, raising concern for mild cirrhosis. IMPRESSION: 1. Cholelithiasis. Borderline prominence of the gallbladder wall, and  dilatation of the common bile duct to 0.9 cm, raising concern for distal obstruction. Would correlate with LFTs, and consider MRCP or ERCP for further evaluation. 2. Findings suggest mild hepatic cirrhosis. Electronically Signed   By: Garald Balding M.D.   On: 06/10/2016 23:27   Dg Ercp Biliary & Pancreatic Ducts  06/11/2016  CLINICAL DATA:  60 year old male with cholelithiasis EXAM: ERCP TECHNIQUE: Multiple spot images obtained with the fluoroscopic device and submitted for interpretation post-procedure. FLUOROSCOPY TIME:  Please see GI operative note for further detail COMPARISON:  Nuclear medicine HIDA scan 06/09/2016; abdominal ultrasound 06/10/2016 ; CT abdomen/ pelvis 05/05/2016 FINDINGS: A total of 8 intraoperative spot films demonstrate a flexible endoscope in the descending duodenum with cannulation of the common bile duct. Cholangiogram demonstrates dilatation of the common bile duct. There appears to be a small filling defect in the distal common duct. Subsequent images demonstrate sphincterotomy, balloon sweep of the common duct and placement of a plastic biliary stent. IMPRESSION: ERCP with sphincterotomy, balloon sweep of the common bile duct and placement of a plastic biliary stent. A small filling defect in the distal common bile duct suggests choledocholithiasis. These images were submitted for radiologic interpretation only. Please see the procedural report for the amount of contrast and the fluoroscopy time utilized. Electronically Signed   By: Jacqulynn Cadet M.D.   On: 06/11/2016 14:13    Anti-infectives: Anti-infectives    Start     Dose/Rate Route Frequency Ordered Stop   06/10/16 2000  vancomycin (VANCOCIN) IVPB 750 mg/150 ml premix     750  mg 150 mL/hr over 60 Minutes Intravenous Every 24 hours 06/10/16 1850     06/09/16 1400  piperacillin-tazobactam (ZOSYN) IVPB 3.375 g     3.375 g 12.5 mL/hr over 240 Minutes Intravenous Every 8 hours 06/09/16 1122     06/09/16 0845   piperacillin-tazobactam (ZOSYN) IVPB 3.375 g     3.375 g 100 mL/hr over 30 Minutes Intravenous  Once 06/09/16 0836 06/09/16 0936      Assessment/Plan: s/p Procedure(s): ENDOSCOPIC RETROGRADE CHOLANGIOPANCREATOGRAPHY (ERCP) (N/A)  Chronic cholecystitis with gallstones, CBD stone s/p ERCP and stent.  Discussed with IR yesterday.  Continuing to hold on perc chole tube as long as he is improving.  Will readdress if his WBC starts to climb again. No surgical intervention planned as he is not an operative candidate from a cardiac standpoint  LOS: 2 days    Chayna Surratt A 06/12/2016

## 2016-06-12 NOTE — Progress Notes (Signed)
Patient ID: [name], male   DOB: 12-13-1955, 60 y.o.   MRN: [id number]    Referring Physician(s): Dr.Doug Magnus Ivan  Supervising Physician: Jolaine Click  Patient Status: inpt  Chief Complaint: RUQ abdominal pain  Subjective: Patient feeling much better s/p stent placement after ERCP yesterday.  He denies any abdominal pain or pain anywhere.  He is hungry  Allergies: Review of patient's allergies indicates no known allergies.  Medications: Prior to Admission medications   Medication Sig Start Date End Date Taking? Authorizing Provider  apixaban (ELIQUIS) 5 MG TABS tablet Take 1 tablet (5 mg total) by mouth 2 (two) times daily. 04/26/16  Yes Dolores Patty, MD  atorvastatin (LIPITOR) 80 MG tablet Take 1 tablet (80 mg total) by mouth daily. 09/04/15  Yes Laurey Morale, MD  co-enzyme Q-10 50 MG capsule Take 50 mg by mouth every morning.    Yes Historical Provider, MD  ferrous sulfate 325 (65 FE) MG tablet Take 1 tablet (325 mg total) by mouth 2 (two) times daily with a meal. 07/24/15  Yes Shanker Levora Dredge, MD  insulin glargine (LANTUS) 100 UNIT/ML injection Inject 30 Units into the skin every morning.   Yes Historical Provider, MD  metoprolol succinate (TOPROL-XL) 25 MG 24 hr tablet Take 0.5 tablets (12.5 mg total) by mouth 2 (two) times daily. 04/12/16  Yes Dolores Patty, MD  nitroGLYCERIN (NITROLINGUAL) 0.4 MG/SPRAY spray Place 1 spray under the tongue every 5 (five) minutes x 3 doses as needed for chest pain. Reported on 06/01/2016   Yes Historical Provider, MD  pantoprazole (PROTONIX) 40 MG tablet Take 1 tablet (40 mg total) by mouth daily. 05/07/16  Yes Graciella Freer, PA-C  potassium chloride SA (K-DUR,KLOR-CON) 20 MEQ tablet Take 20 mEq by mouth daily.   Yes Historical Provider, MD  PROAIR RESPICLICK 108 (90 BASE) MCG/ACT AEPB Inhale 2 puffs into the lungs every 6 (six) hours as needed (for breathing).  08/15/15  Yes Historical Provider, MD  torsemide (DEMADEX) 20  MG tablet Take 4 tablets by mouth every morning and 2 tables by mouth every evening 04/12/16  Yes Amy D Clegg, NP  traMADol (ULTRAM) 50 MG tablet Take 1 tablet (50 mg total) by mouth every 6 (six) hours as needed. 05/12/16  Yes Richarda Overlie, MD  metolazone (ZAROXOLYN) 2.5 MG tablet Take 2.5 mg by mouth as needed. Takes about once a  month 05/02/16   Historical Provider, MD  Omega-3 Fatty Acids (FISH OIL) 1000 MG CAPS Take 1,000 mg by mouth 2 (two) times daily.    Historical Provider, MD    Vital Signs: BP 95/60 mmHg  Pulse 73  Temp(Src) 97.7 F (36.5 C) (Oral)  Resp 20  Ht 5\' 10"  (1.778 m)  Wt 181 lb (82.101 kg)  BMI 25.97 kg/m2  SpO2 97%  Physical Exam: Abd: soft, NT, ND, +BS Heart: regular  Skin: jaundice  Imaging: Dg Chest 2 View  06/09/2016  CLINICAL DATA:  Chest pain with shortness of breath for 1 day EXAM: CHEST  2 VIEW COMPARISON:  May 09, 2016 FINDINGS: There is no edema or consolidation. Heart is upper normal in size with pulmonary vascularity within normal limits. Pacemaker leads are attached to the right heart, stable. No adenopathy. No pneumothorax. There is atherosclerotic calcification in the aorta. There is degenerative change in the thoracic spine and in the shoulders bilaterally. IMPRESSION: No edema or consolidation. Stable pacemaker lead positions. Stable cardiac silhouette. Aortic atherosclerosis. Electronically Signed   By: Chrissie Noa  Margarita Grizzle III M.D.   On: 06/09/2016 07:28   Nm Hepato W/eject Fract  06/09/2016  CLINICAL DATA:  Right upper quadrant pain. Nausea, vomiting. Cholelithiasis. Elevated LFTs. EXAM: NUCLEAR MEDICINE HEPATOBILIARY IMAGING TECHNIQUE: Sequential images of the abdomen were obtained out to 60 minutes following intravenous administration of radiopharmaceutical. RADIOPHARMACEUTICALS:  5.1 mCi Tc-47m  Choletec IV COMPARISON:  Ultrasound performed today. Ultrasound 05/10/2016. CT 05/05/2016. FINDINGS: There is uptake of radiotracer by the liver. This is  somewhat delayed as there is still some activity seen within the heart out to 2 hours. There is no excretion of radiotracer by the liver. Nonvisualization of the biliary system out to 2 hours. IMPRESSION: Somewhat delayed hepatic uptake of radiotracer. No hepatic excretion of radiotracer. Legrand Rams this is related to hepatic dysfunction, possibly hepatitis. Cannot assess gallbladder filling without biliary excretion. Given the findings on recent ultrasound, recommend further evaluation of the biliary system with MRI and MRCP. Electronically Signed   By: Charlett Nose M.D.   On: 06/09/2016 16:54   US Abdomen Limited  06/10/2016  CLINICAL DATA:  Acute onset of generalized abdominal pain and hyperbilirubinemia. Initial encounter. EXAM: US ABDOMEN LIMITED - RIGHT UPPER QUADRANT COMPARISON:  Right upper quadrant ultrasound performed 06/09/2016 FINDINGS: Gallbladder: Stones within the gallbladder measure up to 2.0 cm in size. The gallbladder wall is borderline prominent. No pericholecystic fluid is seen. No sonographic Murphy sign noted by sonographer. Common bile duct: Diameter: 0.9 cm, dilated in appearance. Liver: No focal lesion identified. Mildly nodular contour, with mild heterogeneity, raising concern for mild cirrhosis. IMPRESSION: 1. Cholelithiasis. Borderline prominence of the gallbladder wall, and dilatation of the common bile duct to 0.9 cm, raising concern for distal obstruction. Would correlate with LFTs, and consider MRCP or ERCP for further evaluation. 2. Findings suggest mild hepatic cirrhosis. Electronically Signed   By: Roanna Raider M.D.   On: 06/10/2016 23:27   Dg Ercp Biliary & Pancreatic Ducts  06/11/2016  CLINICAL DATA:  60 year old male with cholelithiasis EXAM: ERCP TECHNIQUE: Multiple spot images obtained with the fluoroscopic device and submitted for interpretation post-procedure. FLUOROSCOPY TIME:  Please see GI operative note for further detail COMPARISON:  Nuclear medicine HIDA scan  06/09/2016; abdominal ultrasound 06/10/2016 ; CT abdomen/ pelvis 05/05/2016 FINDINGS: A total of 8 intraoperative spot films demonstrate a flexible endoscope in the descending duodenum with cannulation of the common bile duct. Cholangiogram demonstrates dilatation of the common bile duct. There appears to be a small filling defect in the distal common duct. Subsequent images demonstrate sphincterotomy, balloon sweep of the common duct and placement of a plastic biliary stent. IMPRESSION: ERCP with sphincterotomy, balloon sweep of the common bile duct and placement of a plastic biliary stent. A small filling defect in the distal common bile duct suggests choledocholithiasis. These images were submitted for radiologic interpretation only. Please see the procedural report for the amount of contrast and the fluoroscopy time utilized. Electronically Signed   By: Malachy Moan M.D.   On: 06/11/2016 14:13   US Abdomen Limited Ruq  06/09/2016  CLINICAL DATA:  Right upper quadrant abdominal pain since midnight, known kidney stones, cirrhosis, diabetes, prostate malignancy. EXAM: US ABDOMEN LIMITED - RIGHT UPPER QUADRANT COMPARISON:  Abdominal ultrasound of May 10, 2016 FINDINGS: Gallbladder: The gallbladder is adequately distended there is mild wall thickening to 4.3 mm. There is no positive sonographic Murphy's sign. There are echogenic mobile shadowing stones measuring up to 1.9 cm in diameter. Common bile duct: Diameter: 8.2 mm.  No intraluminal stones are observed.  Liver: The hepatic echotexture is heterogeneous. The surface contour is irregular. There is no focal mass or ductal dilation. IMPRESSION: 1. Gallstones and mild gallbladder wall thickening possibly reflecting cholecystitis. No pericholecystic fluid or positive sonographic Murphy's sign. 2. Mild dilation of the common bile duct without evidence of common duct stones. 3. Cirrhotic changes within the liver, stable. Electronically Signed   By: David  [place]  M.D.   On: 06/09/2016 08:01    Labs:  CBC:  Recent Labs  06/09/16 0715 06/10/16 0246 06/11/16 0416 06/12/16 0253  WBC 12.1* 12.4* 33.7* 22.3*  HGB 15.4 13.5 13.2 11.8*  HCT 45.4 40.0 38.9* 35.1*  PLT 206 161 141* 86*    COAGS:  Recent Labs  09/26/15 1820 05/10/16 0140 06/09/16 1041 06/10/16 0246  INR 1.59* 1.51* 1.27 1.60*  APTT  --   --   --  32    BMP:  Recent Labs  06/09/16 0759 06/10/16 0246 06/11/16 0416 06/12/16 0253  NA 131* 134* 134* 131*  K 3.8 4.0 3.5 4.9  CL 90* 94* 95* 93*  CO2 26 31 20* 20*  GLUCOSE 445* 188* 75 326*  BUN 41* 41* 49* 69*  CALCIUM 10.6* 10.2 9.5 8.5*  CREATININE 2.70* 2.88* 3.98* 4.76*  GFRNONAA 24* 22* 15* 12*  GFRAA 28* 26* 17* 14*    LIVER FUNCTION TESTS:  Recent Labs  06/09/16 0759 06/10/16 0246 06/11/16 0416 06/12/16 0253  BILITOT 3.2* 6.4* 9.1* 8.9*  AST 354* 272* 208* 285*  ALT 177* 206* 152* 176*  ALKPHOS 456* 302* 317* 212*  PROT 10.2* 7.7 7.6 7.1  ALBUMIN 3.5 2.5* 2.3* 2.0*    Assessment and Plan: 1. Elevated LFTs, possible cholecystitis, cholangitis -stent was placed yesterday during ERCP.  Patient's BP and WBC are improved today.  LFTs are stable, except alk phos dropped some.   -given patient is feeling significantly better today, we will continue to hold on perc chole drain.  Hopefully this stent will be what allows him to improve.  If for some reason he begins to worsen, and it is suspected to be from his gallbladder, then we can look at proceeding with a perc chole drain. -for now, we will follow along. -he may resume a diet from our standpoint  Electronically Signed: Michalina Calbert E 06/12/2016, 8:36 AM   I spent a total of 15 Minutes at the the patient's bedside AND on the patient's hospital floor or unit, greater than 50% of which was counseling/coordinating care for elevated LFTs, RUQ abdominal pain

## 2016-06-12 NOTE — Progress Notes (Signed)
Advanced Heart Failure Rounding Note  Referring Physician: Dr Lily Kocher PCP: Dr. Gerarda Fraction Oncologist: Dr Benay Spice.  CHF: Bensimhon  Reason for Consultation: Cardiac consideration/clearance for GI surgery.   Subjective:    60 y/o with brittle systolic HF. EF 20% and CKD IV. Admitted 06/09/16 with recurrent biliary colic found to have biliary pancreatitis. Course c/b worsening renal failure  Underwent ERCP with stent placement on 7/7. Patient feeling much better. WBC coming down. Ab pain better. Wants to eat. Not peeing much.  No SOB or CP.   Weight stable. Creatinine 2.70 -> 2.88 -> 3.98 -> 4.76  Objective:   Weight Range: 82.101 kg (181 lb) Body mass index is 25.97 kg/(m^2).   Vital Signs:   Temp:  [97.7 F (36.5 C)-98.2 F (36.8 C)] 97.7 F (36.5 C) (07/08 0730) Pulse Rate:  [69-77] 73 (07/08 0730) Resp:  [9-20] 20 (07/08 0730) BP: (77-113)/(40-60) 95/60 mmHg (07/08 0730) SpO2:  [96 %-100 %] 97 % (07/08 0730) Weight:  [82.101 kg (181 lb)] 82.101 kg (181 lb) (07/08 0500) Last BM Date: 06/11/16  Weight change: Filed Weights   06/10/16 0425 06/11/16 0436 06/12/16 0500  Weight: 81.693 kg (180 lb 1.6 oz) 81.149 kg (178 lb 14.4 oz) 82.101 kg (181 lb)    Intake/Output:   Intake/Output Summary (Last 24 hours) at 06/12/16 0921 Last data filed at 06/12/16 0700  Gross per 24 hour  Intake    840 ml  Output    350 ml  Net    490 ml     Physical Exam: General: NAD, HEENT: Scleral icterus, mild jaundice Neck: JVP ~6-7 no thyromegaly or lymphadenopathy nodule.  No carotid bruit.  Lungs: CTAB, normal effort.  CV: Nondisplaced PMI. Regular S1S2, 2/6 SEM RUSB.  Abdomen: Soft, minimally tender, ND, no HSM appreciated. S/p colostomy.  Skin: Intact without lesions or rashes.  Neurologic: Alert and oriented x 3.  Psych: Normal affect. Extremities: No clubbing or cyanosis. [name] with ? Vasculitic/cellulitic changes with no obvious lesion, healing. No peripheral  edema.Normal pedal pulses.    Telemetry: Reviewed personally, Vpaced rhythm 70s  Labs: CBC  Recent Labs  06/11/16 0416 06/12/16 0253  WBC 33.7* 22.3*  HGB 13.2 11.8*  HCT 38.9* 35.1*  MCV 95.6 94.9  PLT 141* 86*   Basic Metabolic Panel  Recent Labs  06/10/16 0246 06/11/16 0416 06/12/16 0253  NA 134* 134* 131*  K 4.0 3.5 4.9  CL 94* 95* 93*  CO2 31 20* 20*  GLUCOSE 188* 75 326*  BUN 41* 49* 69*  CREATININE 2.88* 3.98* 4.76*  CALCIUM 10.2 9.5 8.5*  MG 2.2  --   --    Liver Function Tests  Recent Labs  06/11/16 0416 06/12/16 0253  AST 208* 285*  ALT 152* 176*  ALKPHOS 317* 212*  BILITOT 9.1* 8.9*  PROT 7.6 7.1  ALBUMIN 2.3* 2.0*    Recent Labs  06/11/16 0732  LIPASE 396*   Cardiac Enzymes  Recent Labs  06/09/16 1041 06/09/16 1757 06/09/16 2153  TROPONINI 0.04* 0.04* 0.05*    BNP: BNP (last 3 results)  Recent Labs  09/26/15 1820 05/09/16 2343 06/01/16 1200  BNP 1393.4* 377.6* 1016.7*    ProBNP (last 3 results) No results for input(s): PROBNP in the last 8760 hours.   D-Dimer No results for input(s): DDIMER in the last 72 hours. Hemoglobin A1C  Recent Labs  06/09/16 1041  HGBA1C 11.2*   Fasting Lipid Panel No results for input(s): CHOL, HDL, LDLCALC, TRIG,  CHOLHDL, LDLDIRECT in the last 72 hours. Thyroid Function Tests No results for input(s): TSH, T4TOTAL, T3FREE, THYROIDAB in the last 72 hours.  Invalid input(s): FREET3  Other results:     Imaging/Studies:  US Abdomen Limited  06/10/2016  CLINICAL DATA:  Acute onset of generalized abdominal pain and hyperbilirubinemia. Initial encounter. EXAM: US ABDOMEN LIMITED - RIGHT UPPER QUADRANT COMPARISON:  Right upper quadrant ultrasound performed 06/09/2016 FINDINGS: Gallbladder: Stones within the gallbladder measure up to 2.0 cm in size. The gallbladder wall is borderline prominent. No pericholecystic fluid is seen. No sonographic Murphy sign noted by sonographer. Common  bile duct: Diameter: 0.9 cm, dilated in appearance. Liver: No focal lesion identified. Mildly nodular contour, with mild heterogeneity, raising concern for mild cirrhosis. IMPRESSION: 1. Cholelithiasis. Borderline prominence of the gallbladder wall, and dilatation of the common bile duct to 0.9 cm, raising concern for distal obstruction. Would correlate with LFTs, and consider MRCP or ERCP for further evaluation. 2. Findings suggest mild hepatic cirrhosis. Electronically Signed   By: Garald Balding M.D.   On: 06/10/2016 23:27   Dg Ercp Biliary & Pancreatic Ducts  06/11/2016  CLINICAL DATA:  60 year old male with cholelithiasis EXAM: ERCP TECHNIQUE: Multiple spot images obtained with the fluoroscopic device and submitted for interpretation post-procedure. FLUOROSCOPY TIME:  Please see GI operative note for further detail COMPARISON:  Nuclear medicine HIDA scan 06/09/2016; abdominal ultrasound 06/10/2016 ; CT abdomen/ pelvis 05/05/2016 FINDINGS: A total of 8 intraoperative spot films demonstrate a flexible endoscope in the descending duodenum with cannulation of the common bile duct. Cholangiogram demonstrates dilatation of the common bile duct. There appears to be a small filling defect in the distal common duct. Subsequent images demonstrate sphincterotomy, balloon sweep of the common duct and placement of a plastic biliary stent. IMPRESSION: ERCP with sphincterotomy, balloon sweep of the common bile duct and placement of a plastic biliary stent. A small filling defect in the distal common bile duct suggests choledocholithiasis. These images were submitted for radiologic interpretation only. Please see the procedural report for the amount of contrast and the fluoroscopy time utilized. Electronically Signed   By: Jacqulynn Cadet M.D.   On: 06/11/2016 14:13    Latest Echo  Latest Cath   Medications:     Scheduled Medications: . atorvastatin  80 mg Oral Daily  . ferrous sulfate  325 mg Oral BID WC  .  insulin aspart  0-15 Units Subcutaneous TID WC  . metoprolol succinate  12.5 mg Oral BID  . pantoprazole  40 mg Oral Daily  . piperacillin-tazobactam (ZOSYN)  IV  3.375 g Intravenous Q8H  . potassium chloride SA  20 mEq Oral Daily  . sodium chloride flush  3 mL Intravenous Q12H  . vancomycin  750 mg Intravenous Q24H    Infusions:    PRN Medications: bisacodyl, HYDROcodone-acetaminophen, magnesium citrate, morphine injection, morphine injection, ondansetron **OR** ondansetron (ZOFRAN) IV, senna-docusate, traZODone   Assessment   1. Acute on chronic cholecystitis 2. Chronic systolic CHF - ICM - Echo 05/11/16 with LVEF 10-15%, mild AI, mild MR, normal RV.  3. Chronic Afib with CHADS2VASC score of 5. S/p AV node ablation 4. CAD s/p NSTEMI in 04/20/16 5. CKD stage IV s/p AVF in RUE 6. h/o rectal cancer 2008: Has diverting colostomy. He has follow up with Dr Benay Spice. - Recent CT (05/02/16) with no evidence of recurrence. 7. HLD.  Plan    He looks better today after ERCP and sphincterotomy. WBC and lipase improving. Renal function cotninues to worsen  suspect ATN due to hypotension. Weight and BP now stable. Will give 500cc NS. I worry about the rate of rise in his creatinine. May need Renal to see.   HF stable. On low dose b-blocker. No ACE/ARB due to renal failure. Has not tolerated hydralazine/nitrates in past.   Off apixaban. Will start IV heparin today for AF until renal function settles out.   Will likely need to start heparin bridge s/p procedure today.   Glori Bickers MD 06/12/2016, 9:21 AM  Advanced Heart Failure Team Pager [phone] (M-F; [place])  Please contact [facility] for night-coverage after hours (4p -7a ) and weekends on [email or web address]

## 2016-06-12 NOTE — Progress Notes (Signed)
Please see my earlier note from today.  Addendum: Patient lying in bed in no distress, moderately severely jaundiced, abdomen nondistended and without significant tenderness. No evident respiratory distress.  Cleotis Nipper, M.D. Pager [phone] If no answer or after 5 PM call [phone]

## 2016-06-12 NOTE — Progress Notes (Signed)
PROGRESS NOTE  [name]  XIH:[id number] DOB: 1956-03-30 DOA: 06/09/2016 PCP: Glo Herring., MD Outpatient Specialists:  Subjective: Had sphincterotomy and CBD stent placed yesterday, feels much better since then. No fever, WBC improved. I appreciate all the consulting services help. Continue antibiotics, start heparin, give IV fluids, check BMP in a.m. Clear liquids.  Brief Narrative:  RUQ abdominal pain, has history of severe ischemic cardiomyopathy and rectal cancer status post partial colectomy and colostomy.  Assessment & Plan:   Active Problems:   History of rectal cancer   Ischemic cardiomyopathy-EF 35% 04/24/15 echo   CAD with LCX disease and stents to LAD   Chronic systolic heart failure (HCC)   Paroxysmal atrial fibrillation ([place])   DM type 2, uncontrolled, with renal complications (HCC)   Gallbladder polyp   GERD (gastroesophageal reflux disease)   Orthostatic hypotension   OSA on CPAP   Hyperlipidemia   Transaminitis   Anemia of chronic disease   Right heart failure (HCC)   Cholecystitis   Biliary disease with obstruction   AKI (acute kidney injury) ([place])    RUQ pain likely acute on chronic cholecystitis.  -Patient readmitted with same issues 6/5, not a surgical candidate at the time due to myriad of cardiac issues .  -Abd [place] today shows Gallstones and mild gallbladder wall thickening possibly reflecting cholecystitis. No pericholecystic fluid or positive sonographic Murphy's sign. Mild dilation of the common bile duct without evidence of commonduct stones.  -Total bilirubin 3.2. AST 354 ALT 177 lipase 78.  -HIDA scan done and showed indeterminate results secondary to lack of gallbladder filling. -ERCP done on 06/11/16 by Dr. Amedeo Plenty, with sphincterotomy and placement of CBD stent. -Continue close monitoring.  Likely early sepsis -WBC of 33.7, mild hypertension and likely acute on chronic cholecystitis. -Patient is on Zosyn and vancomycin, blood  cultures obtained.  Acute kidney injury on CKD stage IV baseline creatinine 2.6. -Was on diuretics at home, creatinine jumped from 2.8 to 3.98 overnight. The medics held by cardiology. -Has a R AVF if needed for HD. -Cardiology giving bolus of 500 mL.  Atrial Fibrillation CHA2DS2-VASc score 3, on anticoagulation with Eliquis -Rate is controlled, home medications continued -Eliquis on hold. Started on heparin drip  Chronic systolic heart failure, last echocardiogram 6/6 with Severely dilated cavity, mild LVH, EF 10-15 % Dry weight 82 kg, current weight 82.2 . Appears compensated.  - Daily weights, strict I/O  CAD, Tn 0.01 EKG Shows paced rhythm , last cardiac catheterization with stent in 2015, patient is cardiac pain free at this time. Continue meds   History of Hypotension stable for now BP 104/55 mmHg  Pulse 75  IVF on hold in the setting of severe CHF /Afib with risk of volume overload  Continue to monitor   Type II Diabetes Current blood sugar level is Glucose 445, Anion Gap 15, corrected 16.25. Hold home oral diabetic medications.  Lantus 1/2 dose bid, SSI (moderate) Accucheck q 4 h NPO for now, can advance to carb modified diet when ok with Surgery.  OSA on CPAP/COPD, not on acute exacerbation, stable  Continue CPAP and O2  Leukocytosis, likely reactive due to cholecystitis, pain, inflammation versus underlying infection versus possible cellulitic changes seen on exam on the R lower extremity. WBC 12.1 CXR without acute disease. Afebrile  Plays and Zosyn.   GERD, no acute symptoms: Continue PPI  History of Rectal Cancer, s/p chemo/ XRT No acute changes. Can follow as OP  Consult to Left colostomy care  PROGRESS NOTE  [name]  MRN:[id number] DOB: 09/16/1956 DOA: 06/09/2016 PCP: RobertLAWRENCE J., MD Outpatient Specialists:  Subjective: Had sphincterotomy and CBD stent placed yesterday, feels much better since then. No fever, WBC improved. I appreciate all the consulting services help. Continue antibiotics, start heparin, give IV fluids, check BMP in a.m. Clear liquids.  Brief Narrative:  RUQ abdominal pain, has history of severe ischemic cardiomyopathy and rectal cancer status post partial colectomy and colostomy.  Assessment & Plan:   Active Problems:   History of rectal cancer   Ischemic cardiomyopathy-EF 35% 04/24/15 echo   CAD with LCX disease and stents to LAD   Chronic systolic heart failure (HCC)   Paroxysmal atrial fibrillation (HCC)   DM type 2, uncontrolled, with renal complications (HCC)   Gallbladder polyp   GERD (gastroesophageal reflux disease)   Orthostatic hypotension   OSA on CPAP   Hyperlipidemia   Transaminitis   Anemia of chronic disease   Right heart failure (HCC)   Cholecystitis   Biliary disease with obstruction   AKI (acute kidney injury) (HCC)    RUQ pain likely acute on chronic cholecystitis.  -Patient readmitted with same issues 6/5, not a surgical candidate at the time due to myriad of cardiac issues .  -Abd US today shows Gallstones and mild gallbladder wall thickening possibly reflecting cholecystitis. No pericholecystic fluid or positive sonographic Murphy's sign. Mild dilation of the common bile duct without evidence of commonduct stones.  -Total bilirubin 3.2. AST 354 ALT 177 lipase 78.  -HIDA scan done and showed indeterminate results secondary to lack of gallbladder filling. -ERCP done on 06/11/16 by Dr. Hayes, with sphincterotomy and placement of CBD stent. -Continue close monitoring.  Likely early sepsis -WBC of 33.7, mild hypertension and likely acute on chronic cholecystitis. -Patient is on Zosyn and vancomycin, blood  cultures obtained.  Acute kidney injury on CKD stage IV baseline creatinine 2.6. -Was on diuretics at home, creatinine jumped from 2.8 to 3.98 overnight. The medics held by cardiology. -Has a R AVF if needed for HD. -Cardiology giving bolus of 500 mL.  Atrial Fibrillation CHA2DS2-VASc score 3, on anticoagulation with Eliquis -Rate is controlled, home medications continued -Eliquis on hold. Started on heparin drip  Chronic systolic heart failure, last echocardiogram 6/6 with Severely dilated cavity, mild LVH, EF 10-15 % Dry weight 82 kg, current weight 82.2 . Appears compensated.  - Daily weights, strict I/O  CAD, Tn 0.01 EKG Shows paced rhythm , last cardiac catheterization with stent in 2015, patient is cardiac pain free at this time. Continue meds   History of Hypotension stable for now BP 104/55 mmHg  Pulse 75  IVF on hold in the setting of severe CHF /Afib with risk of volume overload  Continue to monitor   Type II Diabetes Current blood sugar level is Glucose 445, Anion Gap 15, corrected 16.25. Hold home oral diabetic medications.  Lantus 1/2 dose bid, SSI (moderate) Accucheck q 4 h NPO for now, can advance to carb modified diet when ok with Surgery.  OSA on CPAP/COPD, not on acute exacerbation, stable  Continue CPAP and O2  Leukocytosis, likely reactive due to cholecystitis, pain, inflammation versus underlying infection versus possible cellulitic changes seen on exam on the R lower extremity. WBC 12.1 CXR without acute disease. Afebrile  Plays and Zosyn.   GERD, no acute symptoms: Continue PPI  History of Rectal Cancer, s/p chemo/ XRT No acute changes. Can follow as OP  Consult to Left colostomy care     PROGRESS NOTE  [name]  XIH:[id number] DOB: 1956-03-30 DOA: 06/09/2016 PCP: Glo Herring., MD Outpatient Specialists:  Subjective: Had sphincterotomy and CBD stent placed yesterday, feels much better since then. No fever, WBC improved. I appreciate all the consulting services help. Continue antibiotics, start heparin, give IV fluids, check BMP in a.m. Clear liquids.  Brief Narrative:  RUQ abdominal pain, has history of severe ischemic cardiomyopathy and rectal cancer status post partial colectomy and colostomy.  Assessment & Plan:   Active Problems:   History of rectal cancer   Ischemic cardiomyopathy-EF 35% 04/24/15 echo   CAD with LCX disease and stents to LAD   Chronic systolic heart failure (HCC)   Paroxysmal atrial fibrillation ([place])   DM type 2, uncontrolled, with renal complications (HCC)   Gallbladder polyp   GERD (gastroesophageal reflux disease)   Orthostatic hypotension   OSA on CPAP   Hyperlipidemia   Transaminitis   Anemia of chronic disease   Right heart failure (HCC)   Cholecystitis   Biliary disease with obstruction   AKI (acute kidney injury) ([place])    RUQ pain likely acute on chronic cholecystitis.  -Patient readmitted with same issues 6/5, not a surgical candidate at the time due to myriad of cardiac issues .  -Abd [place] today shows Gallstones and mild gallbladder wall thickening possibly reflecting cholecystitis. No pericholecystic fluid or positive sonographic Murphy's sign. Mild dilation of the common bile duct without evidence of commonduct stones.  -Total bilirubin 3.2. AST 354 ALT 177 lipase 78.  -HIDA scan done and showed indeterminate results secondary to lack of gallbladder filling. -ERCP done on 06/11/16 by Dr. Amedeo Plenty, with sphincterotomy and placement of CBD stent. -Continue close monitoring.  Likely early sepsis -WBC of 33.7, mild hypertension and likely acute on chronic cholecystitis. -Patient is on Zosyn and vancomycin, blood  cultures obtained.  Acute kidney injury on CKD stage IV baseline creatinine 2.6. -Was on diuretics at home, creatinine jumped from 2.8 to 3.98 overnight. The medics held by cardiology. -Has a R AVF if needed for HD. -Cardiology giving bolus of 500 mL.  Atrial Fibrillation CHA2DS2-VASc score 3, on anticoagulation with Eliquis -Rate is controlled, home medications continued -Eliquis on hold. Started on heparin drip  Chronic systolic heart failure, last echocardiogram 6/6 with Severely dilated cavity, mild LVH, EF 10-15 % Dry weight 82 kg, current weight 82.2 . Appears compensated.  - Daily weights, strict I/O  CAD, Tn 0.01 EKG Shows paced rhythm , last cardiac catheterization with stent in 2015, patient is cardiac pain free at this time. Continue meds   History of Hypotension stable for now BP 104/55 mmHg  Pulse 75  IVF on hold in the setting of severe CHF /Afib with risk of volume overload  Continue to monitor   Type II Diabetes Current blood sugar level is Glucose 445, Anion Gap 15, corrected 16.25. Hold home oral diabetic medications.  Lantus 1/2 dose bid, SSI (moderate) Accucheck q 4 h NPO for now, can advance to carb modified diet when ok with Surgery.  OSA on CPAP/COPD, not on acute exacerbation, stable  Continue CPAP and O2  Leukocytosis, likely reactive due to cholecystitis, pain, inflammation versus underlying infection versus possible cellulitic changes seen on exam on the R lower extremity. WBC 12.1 CXR without acute disease. Afebrile  Plays and Zosyn.   GERD, no acute symptoms: Continue PPI  History of Rectal Cancer, s/p chemo/ XRT No acute changes. Can follow as OP  Consult to Left colostomy care  PROGRESS NOTE  [name]  MRN:[id number] DOB: 09/16/1956 DOA: 06/09/2016 PCP: RobertLAWRENCE J., MD Outpatient Specialists:  Subjective: Had sphincterotomy and CBD stent placed yesterday, feels much better since then. No fever, WBC improved. I appreciate all the consulting services help. Continue antibiotics, start heparin, give IV fluids, check BMP in a.m. Clear liquids.  Brief Narrative:  RUQ abdominal pain, has history of severe ischemic cardiomyopathy and rectal cancer status post partial colectomy and colostomy.  Assessment & Plan:   Active Problems:   History of rectal cancer   Ischemic cardiomyopathy-EF 35% 04/24/15 echo   CAD with LCX disease and stents to LAD   Chronic systolic heart failure (HCC)   Paroxysmal atrial fibrillation (HCC)   DM type 2, uncontrolled, with renal complications (HCC)   Gallbladder polyp   GERD (gastroesophageal reflux disease)   Orthostatic hypotension   OSA on CPAP   Hyperlipidemia   Transaminitis   Anemia of chronic disease   Right heart failure (HCC)   Cholecystitis   Biliary disease with obstruction   AKI (acute kidney injury) (HCC)    RUQ pain likely acute on chronic cholecystitis.  -Patient readmitted with same issues 6/5, not a surgical candidate at the time due to myriad of cardiac issues .  -Abd US today shows Gallstones and mild gallbladder wall thickening possibly reflecting cholecystitis. No pericholecystic fluid or positive sonographic Murphy's sign. Mild dilation of the common bile duct without evidence of commonduct stones.  -Total bilirubin 3.2. AST 354 ALT 177 lipase 78.  -HIDA scan done and showed indeterminate results secondary to lack of gallbladder filling. -ERCP done on 06/11/16 by Dr. Hayes, with sphincterotomy and placement of CBD stent. -Continue close monitoring.  Likely early sepsis -WBC of 33.7, mild hypertension and likely acute on chronic cholecystitis. -Patient is on Zosyn and vancomycin, blood  cultures obtained.  Acute kidney injury on CKD stage IV baseline creatinine 2.6. -Was on diuretics at home, creatinine jumped from 2.8 to 3.98 overnight. The medics held by cardiology. -Has a R AVF if needed for HD. -Cardiology giving bolus of 500 mL.  Atrial Fibrillation CHA2DS2-VASc score 3, on anticoagulation with Eliquis -Rate is controlled, home medications continued -Eliquis on hold. Started on heparin drip  Chronic systolic heart failure, last echocardiogram 6/6 with Severely dilated cavity, mild LVH, EF 10-15 % Dry weight 82 kg, current weight 82.2 . Appears compensated.  - Daily weights, strict I/O  CAD, Tn 0.01 EKG Shows paced rhythm , last cardiac catheterization with stent in 2015, patient is cardiac pain free at this time. Continue meds   History of Hypotension stable for now BP 104/55 mmHg  Pulse 75  IVF on hold in the setting of severe CHF /Afib with risk of volume overload  Continue to monitor   Type II Diabetes Current blood sugar level is Glucose 445, Anion Gap 15, corrected 16.25. Hold home oral diabetic medications.  Lantus 1/2 dose bid, SSI (moderate) Accucheck q 4 h NPO for now, can advance to carb modified diet when ok with Surgery.  OSA on CPAP/COPD, not on acute exacerbation, stable  Continue CPAP and O2  Leukocytosis, likely reactive due to cholecystitis, pain, inflammation versus underlying infection versus possible cellulitic changes seen on exam on the R lower extremity. WBC 12.1 CXR without acute disease. Afebrile  Plays and Zosyn.   GERD, no acute symptoms: Continue PPI  History of Rectal Cancer, s/p chemo/ XRT No acute changes. Can follow as OP  Consult to Left colostomy care   

## 2016-06-12 NOTE — Progress Notes (Addendum)
ANTICOAGULATION CONSULT NOTE - Initial Consult  Pharmacy Consult for Heparin Indication: atrial fibrillation  No Known Allergies  Patient Measurements: Height: 5\' 10"  (177.8 cm) Weight: 181 lb (82.101 kg) IBW/kg (Calculated) : 73   Vital Signs: Temp: 97.7 F (36.5 C) (07/08 0730) Temp Source: Oral (07/08 0730) BP: 95/60 mmHg (07/08 0730) Pulse Rate: 73 (07/08 0730)  Labs:  Recent Labs  06/09/16 1757 06/09/16 2153  06/10/16 0246 06/11/16 0416 06/12/16 0253  HGB  --   --   < > 13.5 13.2 11.8*  HCT  --   --   --  40.0 38.9* 35.1*  PLT  --   --   --  161 141* 86*  APTT  --   --   --  32  --   --   LABPROT  --   --   --  19.1*  --   --   INR  --   --   --  1.60*  --   --   CREATININE  --   --   --  2.88* 3.98* 4.76*  TROPONINI 0.04* 0.05*  --   --   --   --   < > = values in this interval not displayed.  Estimated Creatinine Clearance: 16.8 mL/min (by C-G formula based on Cr of 4.76).   Medical History: Past Medical History  Diagnosis Date  . Essential hypertension, benign   . CAD (coronary artery disease)     a. BMS to LAD 2001 at [facility] b. PTCA/atherectomy ramus and BMS to LAD 2009  . Chronic systolic heart failure ([place])   . GERD (gastroesophageal reflux disease)   . Paroxysmal atrial fibrillation (HCC)     a. on amiodarone, digoxin and Eliquis  . TIA (transient ischemic attack)   . HLD (hyperlipidemia)   . Orthostatic hypotension   . Dizziness     a. chronic. Admission for this 07/18/2014  . Ischemic cardiomyopathy     EF 18% by nuclear study 2016, multiple myocardial infarctions in past    . Obesity   . Hematuria   . History of blood transfusion     "I've had 2 units so far this year" (09/27/2015)  . Anemia   . SAH (subarachnoid hemorrhage) ([place])     post-traumatic (fall) [facility] 12/2014  . PONV (postoperative nausea and vomiting)   . CHF (congestive heart failure) ([place])   . Myocardial infarction ([place]) 2001  . HCAP (healthcare-associated pneumonia)  07/21/2015  . OSA on CPAP   . Chronic kidney disease, stage IV (severe) ([place])   . DM type 2, uncontrolled, with renal complications ([place])   . Colostomy in place [facility])   . Adenocarcinoma of rectum ([place])     a. 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 Prostate cancer ([place])     a. s/p seed implants with chemo and radiation  . Cholelithiasis 06/2015      Assessment: 59yom admitted with acute cholecystis s/p ERCP and stent placed.  Hx Afib with CVR 70s on metoprolol.  PTA apixaban held on admit for possible surgery and remains on hold until patient more stable. Last apixaban dose 7/4 PTA.  Will plan to bridge with heparin for stroke prevention until apixaban restarted.  Will not bolus and will keep HL at low end of goal to decrease bleeding risk.   Slight drop in h/h but no bleeding ntoed, drop in pltc 140>86 will recheck CBC and monitor closely.  Goal of Therapy:  Heparin level 0.3-0.5 units/ml Monitor platelets by  anticoagulation protocol: Yes   Plan:  Heparin  Drip rate 800 uts/hr HL in 6hr from start Monitor s/s bleeding F/u plans to restart apixaban   Bonnita Nasuti Pharm.D. CPP, BCPS Clinical Pharmacist [phone] 06/12/2016 11:01 AM     PM HL 0.46 on heparin drip 800 uts/hr To prevent accumulation and keep in low range with recent ERCP and low PLTC Drop heparin drip 700 uts/hr  Bonnita Nasuti Pharm.D. CPP, BCPS Clinical Pharmacist [phone] 06/12/2016 6:00 PM

## 2016-06-12 NOTE — Progress Notes (Signed)
Benign course status post yesterday's ERCP, sphincterotomy, and stent placement.   There has been a marked drop in his white count, from 34,000 to a current level of 22,000, although his transaminases are actually a bit higher and his bilirubin is stable.   Of some concern is progressive rising creatinine, consistent with ATN.  Globally, he feels well--substantially better than pre-ERCP.  I have reviewed today's notes from the surgeon and the cardiologist.  I will defer decisions concerning diet advancement and gallbladder management to the surgeon and the cardiologist; however, considering that the patient tolerated general anesthesia for his ERCP yesterday, I would not totally exclude his candidacy for gallbladder surgery if it became necessary.  I will sign off at this time. Please call us if we can be of further assistance in this patient's care.  Cleotis Nipper, M.D. Pager [phone] If no answer or after 5 PM call [phone]

## 2016-06-12 NOTE — Anesthesia Postprocedure Evaluation (Signed)
Anesthesia Post Note  Patient: [name]  Procedure(s) Performed: Procedure(s) (LRB): ENDOSCOPIC RETROGRADE CHOLANGIOPANCREATOGRAPHY (ERCP) (N/A)  Patient location during evaluation: PACU Anesthesia Type: General Level of consciousness: sedated and patient cooperative Pain management: pain level controlled Vital Signs Assessment: post-procedure vital signs reviewed and stable Respiratory status: spontaneous breathing Cardiovascular status: stable Anesthetic complications: no    Last Vitals:  Filed Vitals:   06/12/16 0500 06/12/16 0730  BP: 105/59 95/60  Pulse: 73 73  Temp: 36.7 C 36.5 C  Resp: 18 20    Last Pain:  Filed Vitals:   06/12/16 0733  PainSc: 0-No pain                 Nolon Nations

## 2016-06-13 DIAGNOSIS — I255 Ischemic cardiomyopathy: Secondary | ICD-10-CM

## 2016-06-13 LAB — COMPREHENSIVE METABOLIC PANEL
ALK PHOS: 188 U/L — AB (ref 38–126)
ALT: 159 U/L — AB (ref 17–63)
ANION GAP: 19 — AB (ref 5–15)
AST: 225 U/L — ABNORMAL HIGH (ref 15–41)
Albumin: 2.1 g/dL — ABNORMAL LOW (ref 3.5–5.0)
BUN: 93 mg/dL — ABNORMAL HIGH (ref 6–20)
CALCIUM: 8 mg/dL — AB (ref 8.9–10.3)
CO2: 17 mmol/L — ABNORMAL LOW (ref 22–32)
CREATININE: 5.47 mg/dL — AB (ref 0.61–1.24)
Chloride: 89 mmol/L — ABNORMAL LOW (ref 101–111)
GFR, EST AFRICAN AMERICAN: 12 mL/min — AB (ref >60)
GFR, EST NON AFRICAN AMERICAN: 10 mL/min — AB (ref >60)
Glucose, Bld: 341 mg/dL — ABNORMAL HIGH (ref 65–99)
Potassium: 4.9 mmol/L (ref 3.5–5.1)
Sodium: 125 mmol/L — ABNORMAL LOW (ref 135–145)
TOTAL PROTEIN: 7.3 g/dL (ref 6.5–8.1)
Total Bilirubin: 5.6 mg/dL — ABNORMAL HIGH (ref 0.3–1.2)

## 2016-06-13 LAB — HEPARIN LEVEL (UNFRACTIONATED)
HEPARIN UNFRACTIONATED: 0.63 [IU]/mL (ref 0.30–0.70)
HEPARIN UNFRACTIONATED: 0.49 [IU]/mL (ref 0.30–0.70)

## 2016-06-13 LAB — CBC
HCT: 35.3 % — ABNORMAL LOW (ref 39.0–52.0)
HEMOGLOBIN: 12.1 g/dL — AB (ref 13.0–17.0)
MCH: 31.8 pg (ref 26.0–34.0)
MCHC: 34.3 g/dL (ref 30.0–36.0)
MCV: 92.7 fL (ref 78.0–100.0)
PLATELETS: 88 10*3/uL — AB (ref 150–400)
RBC: 3.81 MIL/uL — AB (ref 4.22–5.81)
RDW: 13.2 % (ref 11.5–15.5)
WBC: 23.6 10*3/uL — AB (ref 4.0–10.5)

## 2016-06-13 LAB — GLUCOSE, CAPILLARY
GLUCOSE-CAPILLARY: 303 mg/dL — AB (ref 65–99)
GLUCOSE-CAPILLARY: 312 mg/dL — AB (ref 65–99)
GLUCOSE-CAPILLARY: 338 mg/dL — AB (ref 65–99)
GLUCOSE-CAPILLARY: 349 mg/dL — AB (ref 65–99)
Glucose-Capillary: 346 mg/dL — ABNORMAL HIGH (ref 65–99)
Glucose-Capillary: 320 mg/dL — ABNORMAL HIGH (ref 65–99)
Glucose-Capillary: 318 mg/dL — ABNORMAL HIGH (ref 65–99)

## 2016-06-13 LAB — VANCOMYCIN, TROUGH: Vancomycin Tr: 18 ug/mL (ref 15–20)

## 2016-06-13 MED ORDER — VANCOMYCIN HCL IN DEXTROSE 750-5 MG/150ML-% IV SOLN
750.0000 mg | INTRAVENOUS | Status: DC
  Administered 2016-06-13 – 2016-06-14 (×2): 750 mg via INTRAVENOUS
  Filled 2016-06-13 (×3): qty 150

## 2016-06-13 MED ORDER — SODIUM CHLORIDE 0.9 % IV SOLN
INTRAVENOUS | Status: DC
  Administered 2016-06-13: 23:00:00 via INTRAVENOUS

## 2016-06-13 MED ORDER — SODIUM CHLORIDE 0.9 % IV BOLUS (SEPSIS)
500.0000 mL | Freq: Once | INTRAVENOUS | Status: AC
  Administered 2016-06-13: 500 mL via INTRAVENOUS

## 2016-06-13 MED ORDER — PIPERACILLIN-TAZOBACTAM IN DEX 2-0.25 GM/50ML IV SOLN
2.2500 g | Freq: Three times a day (TID) | INTRAVENOUS | Status: DC
  Administered 2016-06-13 – 2016-06-15 (×6): 2.25 g via INTRAVENOUS
  Filled 2016-06-13 (×10): qty 50

## 2016-06-13 NOTE — Progress Notes (Signed)
Advanced Heart Failure Rounding Note  Referring Physician: Dr Lily Kocher PCP: Dr. Gerarda Fraction Oncologist: Dr Benay Spice.  CHF: Bensimhon  Reason for Consultation: Cardiac consideration/clearance for GI surgery.   Subjective:    60 y/o with brittle systolic HF. EF 20% and CKD IV. Admitted 06/09/16 with recurrent biliary colic found to have biliary pancreatitis. Course c/b worsening renal failure  Underwent ERCP with stent placement on 7/7. Patient feeling much better. WBC coming down. Ab pain better. Wants to eat. Not peeing much.  No SOB or CP.   Weight stable. Creatinine 2.70 -> 2.88 -> 3.98 -> 4.76  Objective:   Weight Range: 184 lb (83.462 kg) Body mass index is 26.4 kg/(m^2).   Vital Signs:   Temp:  [97.6 F (36.4 C)-97.9 F (36.6 C)] 97.7 F (36.5 C) (07/09 1206) Pulse Rate:  [73-84] 77 (07/09 1206) Resp:  [18] 18 (07/09 1206) BP: (100-108)/(65-71) 100/65 mmHg (07/09 1206) SpO2:  [96 %-99 %] 98 % (07/09 1206) Weight:  [184 lb (83.462 kg)] 184 lb (83.462 kg) (07/09 0429) Last BM Date: 06/12/16  Weight change: Filed Weights   06/11/16 0436 06/12/16 0500 06/13/16 0429  Weight: 178 lb 14.4 oz (81.149 kg) 181 lb (82.101 kg) 184 lb (83.462 kg)    Intake/Output:   Intake/Output Summary (Last 24 hours) at 06/13/16 1221 Last data filed at 06/13/16 1000  Gross per 24 hour  Intake 1536.54 ml  Output    425 ml  Net 1111.54 ml     Physical Exam: General: NAD, HEENT: Scleral icterus, mild jaundice Neck: JVP ~6-7 no thyromegaly or lymphadenopathy nodule.  No carotid bruit.  Lungs: CTAB, normal effort.  CV: Nondisplaced PMI. Regular S1S2, 2/6 SEM RUSB.  Abdomen: Soft, minimally tender, ND, no HSM appreciated. S/p colostomy.  Skin: Intact without lesions or rashes.  Neurologic: Alert and oriented x 3.  Psych: Normal affect. Extremities: No clubbing or cyanosis.  No peripheral edema.Normal pedal pulses.    Telemetry: Reviewed personally, Vpaced rhythm  70s  Labs: CBC  Recent Labs  06/12/16 1122 06/13/16 0736  WBC 21.8* 23.6*  HGB 12.5* 12.1*  HCT 37.4* 35.3*  MCV 94.7 92.7  PLT 90* 88*   Basic Metabolic Panel  Recent Labs  06/12/16 0253 06/13/16 0736  NA 131* 125*  K 4.9 4.9  CL 93* 89*  CO2 20* 17*  GLUCOSE 326* 341*  BUN 69* 93*  CREATININE 4.76* 5.47*  CALCIUM 8.5* 8.0*   Liver Function Tests  Recent Labs  06/12/16 0253 06/13/16 0736  AST 285* 225*  ALT 176* 159*  ALKPHOS 212* 188*  BILITOT 8.9* 5.6*  PROT 7.1 7.3  ALBUMIN 2.0* 2.1*    Recent Labs  06/11/16 0732  LIPASE 396*   Cardiac Enzymes No results for input(s): CKTOTAL, CKMB, CKMBINDEX, TROPONINI in the last 72 hours.  BNP: BNP (last 3 results)  Recent Labs  09/26/15 1820 05/09/16 2343 06/01/16 1200  BNP 1393.4* 377.6* 1016.7*    ProBNP (last 3 results) No results for input(s): PROBNP in the last 8760 hours.   D-Dimer No results for input(s): DDIMER in the last 72 hours. Hemoglobin A1C No results for input(s): HGBA1C in the last 72 hours. Fasting Lipid Panel No results for input(s): CHOL, HDL, LDLCALC, TRIG, CHOLHDL, LDLDIRECT in the last 72 hours. Thyroid Function Tests No results for input(s): TSH, T4TOTAL, T3FREE, THYROIDAB in the last 72 hours.  Invalid input(s): FREET3  Other results:     Imaging/Studies:  Dg Ercp Biliary & Pancreatic  Ducts  06/11/2016  CLINICAL DATA:  60 year old male with cholelithiasis EXAM: ERCP TECHNIQUE: Multiple spot images obtained with the fluoroscopic device and submitted for interpretation post-procedure. FLUOROSCOPY TIME:  Please see GI operative note for further detail COMPARISON:  Nuclear medicine HIDA scan 06/09/2016; abdominal ultrasound 06/10/2016 ; CT abdomen/ pelvis 05/05/2016 FINDINGS: A total of 8 intraoperative spot films demonstrate a flexible endoscope in the descending duodenum with cannulation of the common bile duct. Cholangiogram demonstrates dilatation of the common bile  duct. There appears to be a small filling defect in the distal common duct. Subsequent images demonstrate sphincterotomy, balloon sweep of the common duct and placement of a plastic biliary stent. IMPRESSION: ERCP with sphincterotomy, balloon sweep of the common bile duct and placement of a plastic biliary stent. A small filling defect in the distal common bile duct suggests choledocholithiasis. These images were submitted for radiologic interpretation only. Please see the procedural report for the amount of contrast and the fluoroscopy time utilized. Electronically Signed   By: Jacqulynn Cadet M.D.   On: 06/11/2016 14:13    Latest Echo  Latest Cath   Medications:     Scheduled Medications: . ferrous sulfate  325 mg Oral BID WC  . insulin aspart  0-15 Units Subcutaneous TID WC  . insulin glargine  15 Units Subcutaneous QHS  . metoprolol succinate  12.5 mg Oral BID  . pantoprazole  40 mg Oral Daily  . piperacillin-tazobactam (ZOSYN)  IV  2.25 g Intravenous Q8H  . sodium chloride flush  3 mL Intravenous Q12H    Infusions: . heparin 600 Units/hr (06/13/16 1000)    PRN Medications: bisacodyl, HYDROcodone-acetaminophen, magnesium citrate, morphine injection, morphine injection, ondansetron **OR** ondansetron (ZOFRAN) IV, senna-docusate, traZODone   Assessment / Plan   1. Acute on chronic cholecystitis - s/p ERCP , liver enzymes have imroved slightly  Worrisome that his WBC is increasing   2. Chronic systolic CHF - ICM - Echo 05/11/16 with LVEF 10-15%, mild AI, mild MR, normal RV.  No dyspnea   3. Chronic Afib with CHADS2VASC score of 5. S/p AV node ablation V-paced   4. CAD s/p NSTEMI in 04/20/16 5. CKD stage IV s/p AVF in RUE , creatinine is increasing slightly  Getting IV NS ,  Consider nephrology consult   6. h/o rectal cancer 2008: Has diverting colostomy. He has follow up with Dr Benay Spice. - Recent CT (05/02/16) with no evidence of recurrence. 7. HLD.  Mertie Moores  MD 06/13/2016, 12:21 PM

## 2016-06-13 NOTE — Progress Notes (Signed)
PROGRESS NOTE  [name]  MRN:[id number] DOB: 03/15/1956 DOA: 06/09/2016 PCP: FUSCO,LAWRENCE J., MD Outpatient Specialists:  Subjective: Denies any new complaints, abdominal pain is improving. Denies fever or chills overnight. Continue current antibiotics. Still on clear liquids. Still has significant worsening of his creatinine, it is 5.4 today. Only 425 mL/day urine output yesterday.  Brief Narrative:  RUQ abdominal pain, has history of severe ischemic cardiomyopathy and rectal cancer status post partial colectomy and colostomy.  Assessment & Plan:   Active Problems:   History of rectal cancer   Ischemic cardiomyopathy-EF 35% 04/24/15 echo   CAD with LCX disease and stents to LAD   Chronic systolic heart failure (HCC)   Paroxysmal atrial fibrillation (HCC)   DM type 2, uncontrolled, with renal complications (HCC)   Gallbladder polyp   GERD (gastroesophageal reflux disease)   Orthostatic hypotension   OSA on CPAP   Hyperlipidemia   Transaminitis   Anemia of chronic disease   Right heart failure (HCC)   Cholecystitis   Biliary disease with obstruction   AKI (acute kidney injury) (HCC)    RUQ pain likely acute on chronic cholecystitis.  -Patient readmitted with same issues 6/5, not a surgical candidate at the time due to myriad of cardiac issues .  -Abd US today shows Gallstones and mild gallbladder wall thickening possibly reflecting cholecystitis. No pericholecystic fluid or positive sonographic Murphy's sign. Mild dilation of the common bile duct without evidence of commonduct stones.  -Total bilirubin 3.2. AST 354 ALT 177 lipase 78.  -HIDA scan done and showed indeterminate results secondary to lack of gallbladder filling. -ERCP done on 06/11/16 by Dr. Hayes, with sphincterotomy and placement of CBD stent. -Although transaminases did not go down but total bilirubin improved from 9.1 down to 5.6.  Likely early sepsis -WBC of 33.7, mild hypertension and likely  acute on chronic cholecystitis. -Patient is on Zosyn and vancomycin, blood cultures obtained. Continue current antibiotics.  Acute kidney injury on CKD stage IV baseline creatinine 2.6. -Was on diuretics at home, creatinine increase inconsistently, -Has a R AVF if needed for HD. -He is getting acidotic and the creatinine is worsening, I will notify nephrology to evaluate him.  Atrial Fibrillation CHA2DS2-VASc score 3, on anticoagulation with Eliquis -Rate is controlled, home medications continued -Eliquis on hold. Started on heparin drip  Chronic systolic heart failure, last echocardiogram 6/6 with Severely dilated cavity, mild LVH, EF 10-15 % Dry weight 82 kg, current weight 82.2 . Appears compensated.  - Daily weights, strict I/O  CAD, Tn 0.01 EKG Shows paced rhythm , last cardiac catheterization with stent in 2015, patient is cardiac pain free at this time. Continue meds   History of Hypotension stable for now BP 104/55 mmHg  Pulse 75  IVF on hold in the setting of severe CHF /Afib with risk of volume overload  Continue to monitor   Type II Diabetes Current blood sugar level is Glucose 445, Anion Gap 15, corrected 16.25. Hold home oral diabetic medications.  Lantus 1/2 dose bid, SSI (moderate) Accucheck q 4 h NPO for now, can advance to carb modified diet when ok with Surgery.  OSA on CPAP/COPD, not on acute exacerbation, stable  Continue CPAP and O2  Leukocytosis, likely reactive due to cholecystitis, pain, inflammation versus underlying infection versus possible cellulitic changes seen on exam on the R lower extremity. WBC 12.1 CXR without acute disease. Afebrile  Plays and Zosyn.   GERD, no acute symptoms: Continue PPI  History of Rectal Cancer, s/p   PROGRESS NOTE  [name]  MRN:[id number] DOB: 03/15/1956 DOA: 06/09/2016 PCP: FUSCO,LAWRENCE J., MD Outpatient Specialists:  Subjective: Denies any new complaints, abdominal pain is improving. Denies fever or chills overnight. Continue current antibiotics. Still on clear liquids. Still has significant worsening of his creatinine, it is 5.4 today. Only 425 mL/day urine output yesterday.  Brief Narrative:  RUQ abdominal pain, has history of severe ischemic cardiomyopathy and rectal cancer status post partial colectomy and colostomy.  Assessment & Plan:   Active Problems:   History of rectal cancer   Ischemic cardiomyopathy-EF 35% 04/24/15 echo   CAD with LCX disease and stents to LAD   Chronic systolic heart failure (HCC)   Paroxysmal atrial fibrillation (HCC)   DM type 2, uncontrolled, with renal complications (HCC)   Gallbladder polyp   GERD (gastroesophageal reflux disease)   Orthostatic hypotension   OSA on CPAP   Hyperlipidemia   Transaminitis   Anemia of chronic disease   Right heart failure (HCC)   Cholecystitis   Biliary disease with obstruction   AKI (acute kidney injury) (HCC)    RUQ pain likely acute on chronic cholecystitis.  -Patient readmitted with same issues 6/5, not a surgical candidate at the time due to myriad of cardiac issues .  -Abd US today shows Gallstones and mild gallbladder wall thickening possibly reflecting cholecystitis. No pericholecystic fluid or positive sonographic Murphy's sign. Mild dilation of the common bile duct without evidence of commonduct stones.  -Total bilirubin 3.2. AST 354 ALT 177 lipase 78.  -HIDA scan done and showed indeterminate results secondary to lack of gallbladder filling. -ERCP done on 06/11/16 by Dr. Hayes, with sphincterotomy and placement of CBD stent. -Although transaminases did not go down but total bilirubin improved from 9.1 down to 5.6.  Likely early sepsis -WBC of 33.7, mild hypertension and likely  acute on chronic cholecystitis. -Patient is on Zosyn and vancomycin, blood cultures obtained. Continue current antibiotics.  Acute kidney injury on CKD stage IV baseline creatinine 2.6. -Was on diuretics at home, creatinine increase inconsistently, -Has a R AVF if needed for HD. -He is getting acidotic and the creatinine is worsening, I will notify nephrology to evaluate him.  Atrial Fibrillation CHA2DS2-VASc score 3, on anticoagulation with Eliquis -Rate is controlled, home medications continued -Eliquis on hold. Started on heparin drip  Chronic systolic heart failure, last echocardiogram 6/6 with Severely dilated cavity, mild LVH, EF 10-15 % Dry weight 82 kg, current weight 82.2 . Appears compensated.  - Daily weights, strict I/O  CAD, Tn 0.01 EKG Shows paced rhythm , last cardiac catheterization with stent in 2015, patient is cardiac pain free at this time. Continue meds   History of Hypotension stable for now BP 104/55 mmHg  Pulse 75  IVF on hold in the setting of severe CHF /Afib with risk of volume overload  Continue to monitor   Type II Diabetes Current blood sugar level is Glucose 445, Anion Gap 15, corrected 16.25. Hold home oral diabetic medications.  Lantus 1/2 dose bid, SSI (moderate) Accucheck q 4 h NPO for now, can advance to carb modified diet when ok with Surgery.  OSA on CPAP/COPD, not on acute exacerbation, stable  Continue CPAP and O2  Leukocytosis, likely reactive due to cholecystitis, pain, inflammation versus underlying infection versus possible cellulitic changes seen on exam on the R lower extremity. WBC 12.1 CXR without acute disease. Afebrile  Plays and Zosyn.   GERD, no acute symptoms: Continue PPI  History of Rectal Cancer, s/p   PROGRESS NOTE  [name]  MRN:[id number] DOB: 03/15/1956 DOA: 06/09/2016 PCP: FUSCO,LAWRENCE J., MD Outpatient Specialists:  Subjective: Denies any new complaints, abdominal pain is improving. Denies fever or chills overnight. Continue current antibiotics. Still on clear liquids. Still has significant worsening of his creatinine, it is 5.4 today. Only 425 mL/day urine output yesterday.  Brief Narrative:  RUQ abdominal pain, has history of severe ischemic cardiomyopathy and rectal cancer status post partial colectomy and colostomy.  Assessment & Plan:   Active Problems:   History of rectal cancer   Ischemic cardiomyopathy-EF 35% 04/24/15 echo   CAD with LCX disease and stents to LAD   Chronic systolic heart failure (HCC)   Paroxysmal atrial fibrillation (HCC)   DM type 2, uncontrolled, with renal complications (HCC)   Gallbladder polyp   GERD (gastroesophageal reflux disease)   Orthostatic hypotension   OSA on CPAP   Hyperlipidemia   Transaminitis   Anemia of chronic disease   Right heart failure (HCC)   Cholecystitis   Biliary disease with obstruction   AKI (acute kidney injury) (HCC)    RUQ pain likely acute on chronic cholecystitis.  -Patient readmitted with same issues 6/5, not a surgical candidate at the time due to myriad of cardiac issues .  -Abd US today shows Gallstones and mild gallbladder wall thickening possibly reflecting cholecystitis. No pericholecystic fluid or positive sonographic Murphy's sign. Mild dilation of the common bile duct without evidence of commonduct stones.  -Total bilirubin 3.2. AST 354 ALT 177 lipase 78.  -HIDA scan done and showed indeterminate results secondary to lack of gallbladder filling. -ERCP done on 06/11/16 by Dr. Hayes, with sphincterotomy and placement of CBD stent. -Although transaminases did not go down but total bilirubin improved from 9.1 down to 5.6.  Likely early sepsis -WBC of 33.7, mild hypertension and likely  acute on chronic cholecystitis. -Patient is on Zosyn and vancomycin, blood cultures obtained. Continue current antibiotics.  Acute kidney injury on CKD stage IV baseline creatinine 2.6. -Was on diuretics at home, creatinine increase inconsistently, -Has a R AVF if needed for HD. -He is getting acidotic and the creatinine is worsening, I will notify nephrology to evaluate him.  Atrial Fibrillation CHA2DS2-VASc score 3, on anticoagulation with Eliquis -Rate is controlled, home medications continued -Eliquis on hold. Started on heparin drip  Chronic systolic heart failure, last echocardiogram 6/6 with Severely dilated cavity, mild LVH, EF 10-15 % Dry weight 82 kg, current weight 82.2 . Appears compensated.  - Daily weights, strict I/O  CAD, Tn 0.01 EKG Shows paced rhythm , last cardiac catheterization with stent in 2015, patient is cardiac pain free at this time. Continue meds   History of Hypotension stable for now BP 104/55 mmHg  Pulse 75  IVF on hold in the setting of severe CHF /Afib with risk of volume overload  Continue to monitor   Type II Diabetes Current blood sugar level is Glucose 445, Anion Gap 15, corrected 16.25. Hold home oral diabetic medications.  Lantus 1/2 dose bid, SSI (moderate) Accucheck q 4 h NPO for now, can advance to carb modified diet when ok with Surgery.  OSA on CPAP/COPD, not on acute exacerbation, stable  Continue CPAP and O2  Leukocytosis, likely reactive due to cholecystitis, pain, inflammation versus underlying infection versus possible cellulitic changes seen on exam on the R lower extremity. WBC 12.1 CXR without acute disease. Afebrile  Plays and Zosyn.   GERD, no acute symptoms: Continue PPI  History of Rectal Cancer, s/p   PROGRESS NOTE  [name]  MRN:[id number] DOB: 03/15/1956 DOA: 06/09/2016 PCP: FUSCO,LAWRENCE J., MD Outpatient Specialists:  Subjective: Denies any new complaints, abdominal pain is improving. Denies fever or chills overnight. Continue current antibiotics. Still on clear liquids. Still has significant worsening of his creatinine, it is 5.4 today. Only 425 mL/day urine output yesterday.  Brief Narrative:  RUQ abdominal pain, has history of severe ischemic cardiomyopathy and rectal cancer status post partial colectomy and colostomy.  Assessment & Plan:   Active Problems:   History of rectal cancer   Ischemic cardiomyopathy-EF 35% 04/24/15 echo   CAD with LCX disease and stents to LAD   Chronic systolic heart failure (HCC)   Paroxysmal atrial fibrillation (HCC)   DM type 2, uncontrolled, with renal complications (HCC)   Gallbladder polyp   GERD (gastroesophageal reflux disease)   Orthostatic hypotension   OSA on CPAP   Hyperlipidemia   Transaminitis   Anemia of chronic disease   Right heart failure (HCC)   Cholecystitis   Biliary disease with obstruction   AKI (acute kidney injury) (HCC)    RUQ pain likely acute on chronic cholecystitis.  -Patient readmitted with same issues 6/5, not a surgical candidate at the time due to myriad of cardiac issues .  -Abd US today shows Gallstones and mild gallbladder wall thickening possibly reflecting cholecystitis. No pericholecystic fluid or positive sonographic Murphy's sign. Mild dilation of the common bile duct without evidence of commonduct stones.  -Total bilirubin 3.2. AST 354 ALT 177 lipase 78.  -HIDA scan done and showed indeterminate results secondary to lack of gallbladder filling. -ERCP done on 06/11/16 by Dr. Hayes, with sphincterotomy and placement of CBD stent. -Although transaminases did not go down but total bilirubin improved from 9.1 down to 5.6.  Likely early sepsis -WBC of 33.7, mild hypertension and likely  acute on chronic cholecystitis. -Patient is on Zosyn and vancomycin, blood cultures obtained. Continue current antibiotics.  Acute kidney injury on CKD stage IV baseline creatinine 2.6. -Was on diuretics at home, creatinine increase inconsistently, -Has a R AVF if needed for HD. -He is getting acidotic and the creatinine is worsening, I will notify nephrology to evaluate him.  Atrial Fibrillation CHA2DS2-VASc score 3, on anticoagulation with Eliquis -Rate is controlled, home medications continued -Eliquis on hold. Started on heparin drip  Chronic systolic heart failure, last echocardiogram 6/6 with Severely dilated cavity, mild LVH, EF 10-15 % Dry weight 82 kg, current weight 82.2 . Appears compensated.  - Daily weights, strict I/O  CAD, Tn 0.01 EKG Shows paced rhythm , last cardiac catheterization with stent in 2015, patient is cardiac pain free at this time. Continue meds   History of Hypotension stable for now BP 104/55 mmHg  Pulse 75  IVF on hold in the setting of severe CHF /Afib with risk of volume overload  Continue to monitor   Type II Diabetes Current blood sugar level is Glucose 445, Anion Gap 15, corrected 16.25. Hold home oral diabetic medications.  Lantus 1/2 dose bid, SSI (moderate) Accucheck q 4 h NPO for now, can advance to carb modified diet when ok with Surgery.  OSA on CPAP/COPD, not on acute exacerbation, stable  Continue CPAP and O2  Leukocytosis, likely reactive due to cholecystitis, pain, inflammation versus underlying infection versus possible cellulitic changes seen on exam on the R lower extremity. WBC 12.1 CXR without acute disease. Afebrile  Plays and Zosyn.   GERD, no acute symptoms: Continue PPI  History of Rectal Cancer, s/p

## 2016-06-13 NOTE — Progress Notes (Signed)
ANTICOAGULATION CONSULT NOTE - Follow Up Consult  Pharmacy Consult for heparin Indication: atrial fibrillation  No Known Allergies  Patient Measurements: Height: 5\' 10"  (177.8 cm) Weight: 184 lb (83.462 kg) IBW/kg (Calculated) : 73 Heparin Dosing Weight: 82 kg  Vital Signs: Temp: 97.8 F (36.6 C) (07/09 0429) Temp Source: Oral (07/09 0429) BP: 105/69 mmHg (07/09 0429) Pulse Rate: 73 (07/09 0429)  Labs:  Recent Labs  06/11/16 0416 06/12/16 0253 06/12/16 1122 06/12/16 1600 06/13/16 0736  HGB 13.2 11.8* 12.5*  --  12.1*  HCT 38.9* 35.1* 37.4*  --  35.3*  PLT 141* 86* 90*  --  88*  HEPARINUNFRC  --   --   --  0.46 0.63  CREATININE 3.98* 4.76*  --   --  5.47*    Estimated Creatinine Clearance: 14.6 mL/min (by C-G formula based on Cr of 5.47).   Medications:  Infusions:  . heparin 700 Units/hr (06/12/16 1854)    Assessment: 59yom admitted with acute cholecystis s/p ERCP and stent placed. Hx Afib with CVR 70s on metoprolol. PTA apixaban held on admit for possible surgery and remains on hold until patient more stable. Last apixaban dose 7/4 PTA. Will plan to bridge with heparin for stroke prevention until apixaban restarted. Will not bolus and will keep HL at low end of goal to decrease bleeding risk.   HL 0.63 and above therapeutic range for this patient. No bleeding noted, Hgb stable in 12's, platelets are low at 88 but stable. Renal function worse as SCr rising. Spoke with RN and no line or bleeding issues.  Goal of Therapy:  Heparin level 0.3-0.5 units/ml Monitor platelets by anticoagulation protocol: Yes   Plan:  - Decrease heparin drip to 600 units/hr - 8 hr HL - Daily HL and CBC - Monitor for s/sx of bleeding   Renold Genta, PharmD, BCPS Clinical Pharmacist Pager: [phone] 06/13/2016 9:11 AM

## 2016-06-13 NOTE — Progress Notes (Signed)
ANTICOAGULATION CONSULT NOTE - Follow Up Consult  Pharmacy Consult for heparin Indication: atrial fibrillation  No Known Allergies  Patient Measurements: Height: 5\' 10"  (177.8 cm) Weight: 184 lb (83.462 kg) IBW/kg (Calculated) : 73 Heparin Dosing Weight: 82 kg  Vital Signs: Temp: 97.7 F (36.5 C) (07/09 1206) Temp Source: Oral (07/09 1206) BP: 100/65 mmHg (07/09 1206) Pulse Rate: 77 (07/09 1206)  Labs:  Recent Labs  06/11/16 0416 06/12/16 0253 06/12/16 1122 06/12/16 1600 06/13/16 0736 06/13/16 1805  HGB 13.2 11.8* 12.5*  --  12.1*  --   HCT 38.9* 35.1* 37.4*  --  35.3*  --   PLT 141* 86* 90*  --  88*  --   HEPARINUNFRC  --   --   --  0.46 0.63 0.49  CREATININE 3.98* 4.76*  --   --  5.47*  --     Estimated Creatinine Clearance: 14.6 mL/min (by C-G formula based on Cr of 5.47).   Medications:  Infusions:  . heparin 600 Units/hr (06/13/16 1800)    Assessment: 59yom admitted with acute cholecystis s/p ERCP and stent placed. Hx Afib with CVR 70s on metoprolol. PTA apixaban held on admit for possible surgery and remains on hold until patient more stable. Last apixaban dose 7/4 PTA. Will plan to bridge with heparin for stroke prevention until apixaban restarted. Will not bolus and will keep HL at low end of goal to decrease bleeding risk.   HL therapeutic x1 (0.49) on 600 units/h. Hgb stable in 12's, platelets are low at 88 but stable. RN noted this afternoon pt with pink-tinged sputum (small amount) early this AM and elbow oozing a bit (small skin tear) - pt was unconcerned. RN to continue to monitor.   Goal of Therapy:  Heparin level 0.3-0.5 units/ml Monitor platelets by anticoagulation protocol: Yes   Plan:  - Continue heparin drip at 600 units/hr - 8 hr HL to confirm - Daily HL and CBC - Monitor for s/sx of bleeding   Elicia Lamp, PharmD, [facility] Clinical Pharmacist Pager [phone] 06/13/2016 6:56 PM

## 2016-06-13 NOTE — Consult Note (Addendum)
Renal Service Consult Note [facility]  [name] 06/13/2016 Sol Blazing Requesting Physician:  Dr Hartford Poli  Reason for Consult:  Acute on CKD HPI: The patient is a 59 y.o. year-old with history of CAD, ischemic CM EF 20-25%, BiV ICD, chron atrial fib s/o ablation 9/16, CKD stage 4 f/b Dr Hinda Lenis in [place], rectal cancer sp colectomy/ colostomy in 2008.  Pt presented on 7/5 with abd pain and found to have possible cholecystitis and possible CBD stones.  Wasn't a surgical candidate. Had large bump in WBC in hospital and tbili and was thought to be developing ascending cholangitis.  Underwent ERCP on 7/7 by GI.  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  A sphincterotomy was performed.  No stones were found. A stent was placed. His WBC improved thereafter and overall he felt a lot better after the procedure.  Over the last few days creat has slowly risen from 2.5- 3 range up to 4.7 yest and 5.47 today.  UOP 500 cc per day, BP's soft 105/ 70 , HR 84 and R 18.  98% on RA.  No SOB, swelling or cough.  No orthopnea. Last CXR on 7/5 was clear. Total I/O's are + 1.3 L this hosp stay.   Baseline creat over the last 18 months is 2.0- 3.0, in the last 6 months 2.5- 3.0.     ROS  denies CP  no joint pain   no HA  no blurry vision  no rash  no diarrhea  no nausea/ vomiting  no dysuria  no difficulty voiding  no change in urine color    Past Medical History  Past Medical History  Diagnosis Date  . Essential hypertension, benign   . CAD (coronary artery disease)     a. BMS to LAD 2001 at [facility] b. PTCA/atherectomy ramus and BMS to LAD 2009  . Chronic systolic heart failure ([place])   . GERD (gastroesophageal reflux disease)   . Paroxysmal atrial fibrillation (HCC)     a. on amiodarone, digoxin and Eliquis  . TIA (transient ischemic attack)   . HLD (hyperlipidemia)   . Orthostatic hypotension   . Dizziness     a. chronic. Admission for this 07/18/2014  .  Ischemic cardiomyopathy     EF 18% by nuclear study 2016, multiple myocardial infarctions in past    . Obesity   . Hematuria   . History of blood transfusion     "I've had 2 units so far this year" (09/27/2015)  . Anemia   . SAH (subarachnoid hemorrhage) ([place])     post-traumatic (fall) [facility] 12/2014  . PONV (postoperative nausea and vomiting)   . CHF (congestive heart failure) ([place])   . Myocardial infarction ([place]) 2001  . HCAP (healthcare-associated pneumonia) 07/21/2015  . OSA on CPAP   . Chronic kidney disease, stage IV (severe) ([place])   . DM type 2, uncontrolled, with renal complications ([place])   . Colostomy in place [facility])   . Adenocarcinoma of rectum ([place])     a. 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 Prostate cancer ([employer])     a. s/p seed implants with chemo and radiation  . Cholelithiasis 06/2015   Past Surgical History  Past Surgical History  Procedure Laterality Date  . Cardiac defibrillator placement  2002  . Abdominal and perineal resection of rectum with total mesorectal excision  10/04/2007  . Colonoscopy  09/14/2011    Dr. Gala Romney: via colostomy, Single pedunculated benign inflammatory polyp. Due for surveillance Oct 2015  .  Colostomy  2008  . Colonoscopy N/A 07/02/2014    Procedure: COLONOSCOPY;  Surgeon: Daneil Dolin, MD;  [facility];  Service: Endoscopy;  Laterality: N/A;  7:30 / COLONOSCOPY THRU COLOSTOMY  . Esophagogastroduodenoscopy N/A 07/02/2014    Procedure: ESOPHAGOGASTRODUODENOSCOPY (EGD);  Surgeon: Daneil Dolin, MD;  [facility];  Service: Endoscopy;  Laterality: N/A;  7:30  . Savory dilation N/A 07/02/2014    Procedure: SAVORY DILATION;  Surgeon: Daneil Dolin, MD;  [facility];  Service: Endoscopy;  Laterality: N/A;  7:30  . Maloney dilation N/A 07/02/2014    Procedure: Venia Minks DILATION;  Surgeon: Daneil Dolin, MD;  [facility];  Service: Endoscopy;  Laterality: N/A;  7:30  . Portacath placement  06/2007    "removed ~ 1 yr later"   . Left heart catheterization with coronary angiogram N/A 07/13/2013    Procedure: LEFT HEART CATHETERIZATION WITH CORONARY ANGIOGRAM;  Surgeon: Lorretta Harp, MD;  Location: [facility] CATH LAB;  Service: Cardiovascular;  Laterality: N/A;  . Colonoscopy N/A 12/11/2014    Dr. Gala Romney via colostomy. Normal. Repeat in 2021.   Marland Kitchen Esophagogastroduodenoscopy N/A 12/11/2014    [id number] EGD  . Cardiac defibrillator placement  2009    Upgraded to a BiV ICD  . Right heart catheterization N/A 02/24/2015    Procedure: RIGHT HEART CATH;  Surgeon: Jolaine Artist, MD;  Location: [facility] CATH LAB;  Service: Cardiovascular;  Laterality: N/A;  . Ep implantable device N/A 04/10/2015    Procedure: Ppm/Biv Ppm Generator Changeout;  Surgeon: Evans Lance, MD;  Location: [place] INVASIVE CV LAB CUPID;  Service: Cardiovascular;  Laterality: N/A;  . Bi-ventricular implantable cardioverter defibrillator  (crt-d)  2009  . Cardiac catheterization  08/2001; 2009    ; /notes 07/10/2013  . Coronary angioplasty with stent placement  2001; ~ 2006    "1 + 1"   . Givens capsule study N/A 07/23/2015    Procedure: GIVENS CAPSULE STUDY;  Surgeon: Wilford Corner, MD;  Location: [facility] ENDOSCOPY;  Service: Endoscopy;  Laterality: N/A;  . Colonoscopy N/A 08/24/2015    Procedure: COLONOSCOPY;  Surgeon: Manus Gunning, MD;  Location: [place];  Service: Gastroenterology;  Laterality: N/A;  . Electrophysiologic study N/A 08/28/2015    Procedure: AV Node Ablation;  Surgeon: Will Meredith Leeds, MD;  Location: [place] CV LAB;  Service: Cardiovascular;  Laterality: N/A;  . Av fistula placement Right 09/16/2015    Procedure: ARTERIOVENOUS (AV) FISTULA CREATION - BRACHIOCEPHALIC;  Surgeon: Elam Dutch, MD;  Location: [facility] OR;  Service: Vascular;  Laterality: Right;  . Ercp N/A 06/11/2016    Procedure: ENDOSCOPIC RETROGRADE CHOLANGIOPANCREATOGRAPHY (ERCP);  Surgeon: Teena Irani, MD;  Location: [facility] ENDOSCOPY;  Service: Endoscopy;  Laterality: N/A;    Family History  Family History  Problem Relation Age of Onset  . Colon cancer Mother 45  . Colon cancer Sister 32  . Coronary artery disease Father   . Colon cancer Other     2 cousins, succumbed to illness   Social History  reports that he has never smoked. He has never used smokeless tobacco. He reports that he does not drink alcohol or use illicit drugs. Allergies No Known Allergies Home medications Prior to Admission medications   Medication Sig Start Date End Date Taking? Authorizing Provider  apixaban (ELIQUIS) 5 MG TABS tablet Take 1 tablet (5 mg total) by mouth 2 (two) times daily. 04/26/16  Yes Jolaine Artist, MD  atorvastatin (  LIPITOR) 80 MG tablet Take 1 tablet (80 mg total) by mouth daily. 09/04/15  Yes Larey Dresser, MD  co-enzyme Q-10 50 MG capsule Take 50 mg by mouth every morning.    Yes Historical Provider, MD  ferrous sulfate 325 (65 FE) MG tablet Take 1 tablet (325 mg total) by mouth 2 (two) times daily with a meal. 07/24/15  Yes Shanker Kristeen Mans, MD  insulin glargine (LANTUS) 100 UNIT/ML injection Inject 30 Units into the skin every morning.   Yes Historical Provider, MD  metoprolol succinate (TOPROL-XL) 25 MG 24 hr tablet Take 0.5 tablets (12.5 mg total) by mouth 2 (two) times daily. 04/12/16  Yes Jolaine Artist, MD  nitroGLYCERIN (NITROLINGUAL) 0.4 MG/SPRAY spray Place 1 spray under the tongue every 5 (five) minutes x 3 doses as needed for chest pain. Reported on 06/01/2016   Yes Historical Provider, MD  pantoprazole (PROTONIX) 40 MG tablet Take 1 tablet (40 mg total) by mouth daily. 05/07/16  Yes Shirley Friar, PA-C  potassium chloride SA (K-DUR,KLOR-CON) 20 MEQ tablet Take 20 mEq by mouth daily.   Yes Historical Provider, MD  PROAIR RESPICLICK [id number] (90 BASE) MCG/ACT AEPB Inhale 2 puffs into the lungs every 6 (six) hours as needed (for breathing).  08/15/15  Yes Historical Provider, MD  torsemide (DEMADEX) 20 MG tablet Take 4 tablets by mouth every morning  and 2 tables by mouth every evening 04/12/16  Yes Amy D Clegg, NP  traMADol (ULTRAM) 50 MG tablet Take 1 tablet (50 mg total) by mouth every 6 (six) hours as needed. 05/12/16  Yes Reyne Dumas, MD  metolazone (ZAROXOLYN) 2.5 MG tablet Take 2.5 mg by mouth as needed. Takes about once a  month 05/02/16   Historical Provider, MD  Omega-3 Fatty Acids (FISH OIL) 1000 MG CAPS Take 1,000 mg by mouth 2 (two) times daily.    Historical Provider, MD   Liver Function Tests  Recent Labs Lab 06/11/16 0416 06/12/16 0253 06/13/16 0736  AST 208* 285* 225*  ALT 152* 176* 159*  ALKPHOS 317* 212* 188*  BILITOT 9.1* 8.9* 5.6*  PROT 7.6 7.1 7.3  ALBUMIN 2.3* 2.0* 2.1*    Recent Labs Lab 06/09/16 0759 06/11/16 0732  LIPASE 78* 396*   CBC  Recent Labs Lab 06/12/16 0253 06/12/16 1122 06/13/16 0736  WBC 22.3* 21.8* 23.6*  HGB 11.8* 12.5* 12.1*  HCT 35.1* 37.4* 35.3*  MCV 94.9 94.7 92.7  PLT 86* 90* 88*   Basic Metabolic Panel  Recent Labs Lab 06/09/16 0759 06/10/16 0246 06/11/16 0416 06/12/16 0253 06/13/16 0736  NA 131* 134* 134* 131* 125*  K 3.8 4.0 3.5 4.9 4.9  CL 90* 94* 95* 93* 89*  CO2 26 31 20* 20* 17*  GLUCOSE 445* 188* 75 326* 341*  BUN 41* 41* 49* 69* 93*  CREATININE 2.70* 2.88* 3.98* 4.76* 5.47*  CALCIUM 10.6* 10.2 9.5 8.5* 8.0*   Iron/TIBC/Ferritin/ %Sat    Component Value Date/Time   IRON 123 08/26/2015 0428   TIBC 280 08/26/2015 0428   FERRITIN 1419* 08/26/2015 0428   IRONPCTSAT 44* 08/26/2015 0428    Filed Vitals:   06/12/16 1957 06/13/16 0429 06/13/16 1206 06/13/16 1934  BP: 108/71 105/69 100/65 104/73  Pulse: 84 73 77 84  Temp: 97.9 F (36.6 C) 97.8 F (36.6 C) 97.7 F (36.5 C) 97.7 F (36.5 C)  TempSrc: Oral Oral Oral Oral  Resp: 18 18 18 18   Height:      Weight:  83.462 kg (  184 lb)    SpO2: 99% 96% 98% 98%   Exam Gen alert, no distress, up in chair, calm No rash, cyanosis or gangrene Sclera anicteric, throat clear   No jvd or bruits, flat neck  veins Chest clear bilat RRR 2/6 sem no RG Abd soft ntnd no mass or ascites +bs  LLQ colostomy GU normal male MS no joint effusions or deformity Ext no LE or UE edema / no wounds or ulcers Neuro is alert, Ox 3 , nf R arm AVF +bruit   Assessment: 1.  Acute on CKD4 - baseline creat 2.5- 3.0, has AVF placed 10 mos ago, f/b Dr Hinda Lenis in [place].  Looks dry on exam and BP's are soft.  Hold metoprolol and resume IVF's (stopped this am after about 12 hrs infusion).  IF needs HD he has a good AVF in the R arm.   2.  Abd pain / poss cholangitis / ^WBC/ Tbili - sp ERCP w sphincterotomy and stent placement. LFT's better 3.  Ischemic CM EF 20-25%/ BiV ICD 4.  Chron afib on eliquis 5.  Hx rectal Ca, has colostomy 6.  Hx CAD w NSTEMI 5/17   Plan - NS bolus 500 cc and resume IVF's at 75 cc/hr w NS.  Stop MTP for now.    Kelly Splinter MD [employer] pager [phone]    cell [phone] 06/13/2016, 10:13 PM

## 2016-06-13 NOTE — Progress Notes (Signed)
Pharmacy Antibiotic Note  [name] is a 60 y.o. male admitted on 06/09/2016 with cholecystitis.  Pharmacy has been consulted for vancomycin dosing.  Patient with leukocytosis from cholecystitis and suspected early sepsis. SCr jumped from 2.8 (BL) >5.5 today, with elevated WBC 23, remains afebrile, s/p ERCP and stent placed 7/7.   VT 18 continue  Plan: -Continue Vancomycin 750 IV every 24 hours.  Goal trough 15-20 mcg/mL  for sepsis. -Zosyn 3.375g IV q8h (4 hour infusion).  Height: 5\' 10"  (177.8 cm) Weight: 184 lb (83.462 kg) IBW/kg (Calculated) : 73  Temp (24hrs), Avg:97.7 F (36.5 C), Min:97.7 F (36.5 C), Max:97.8 F (36.6 C)   Recent Labs Lab 06/09/16 0759 06/10/16 0246 06/11/16 0416 06/11/16 2004 06/12/16 0253 06/12/16 1122 06/13/16 0736 06/13/16 2008  WBC  --  12.4* 33.7*  --  22.3* 21.8* 23.6*  --   CREATININE 2.70* 2.88* 3.98*  --  4.76*  --  5.47*  --   VANCOTROUGH  --   --   --   --   --   --   --  18  VANCORANDOM  --   --   --  11  --   --   --   --     Estimated Creatinine Clearance: 14.6 mL/min (by C-G formula based on Cr of 5.47).    No Known Allergies  Antimicrobials this admission: Zosyn 7/5 >>  Vancomycin 7/6 >>   Dose adjustments this admission: 7/7 VR = 11 after one dose of 750mg  7/9 VT 18 after 3 doses 750 mg  Microbiology results: 7/7 BCx: sent  Bonnita Nasuti Pharm.D. CPP, BCPS Clinical Pharmacist [phone] 06/13/2016 9:28 PM

## 2016-06-13 NOTE — Progress Notes (Addendum)
2 Days Post-Op  Subjective: Feels fine today, no abd pain  Objective: Vital signs in last 24 hours: Temp:  [97 F (36.1 C)-97.9 F (36.6 C)] 97.8 F (36.6 C) (07/09 0429) Pulse Rate:  [73-84] 73 (07/09 0429) Resp:  [18] 18 (07/09 0429) BP: (95-108)/(67-71) 105/69 mmHg (07/09 0429) SpO2:  [96 %-99 %] 96 % (07/09 0429) Weight:  [83.462 kg (184 lb)] 83.462 kg (184 lb) (07/09 0429) Last BM Date: 06/12/16  Intake/Output from previous day: 07/08 0701 - 07/09 0700 In: 1670.9 [P.O.:530; I.V.:740.9; IV Piggyback:400] Out: 425 [Urine:425] Intake/Output this shift: Total I/O In: 88.3 [I.V.:38.3; IV Piggyback:50] Out: -   GI: soft nt/nd  Lab Results:   Recent Labs  06/12/16 1122 06/13/16 0736  WBC 21.8* 23.6*  HGB 12.5* 12.1*  HCT 37.4* 35.3*  PLT 90* 88*   BMET  Recent Labs  06/12/16 0253 06/13/16 0736  NA 131* 125*  K 4.9 4.9  CL 93* 89*  CO2 20* 17*  GLUCOSE 326* 341*  BUN 69* 93*  CREATININE 4.76* 5.47*  CALCIUM 8.5* 8.0*   PT/INR No results for input(s): LABPROT, INR in the last 72 hours. ABG No results for input(s): PHART, HCO3 in the last 72 hours.  Invalid input(s): PCO2, PO2  Studies/Results: Dg Ercp Biliary & Pancreatic Ducts  06/11/2016  CLINICAL DATA:  60 year old male with cholelithiasis EXAM: ERCP TECHNIQUE: Multiple spot images obtained with the fluoroscopic device and submitted for interpretation post-procedure. FLUOROSCOPY TIME:  Please see GI operative note for further detail COMPARISON:  Nuclear medicine HIDA scan 06/09/2016; abdominal ultrasound 06/10/2016 ; CT abdomen/ pelvis 05/05/2016 FINDINGS: A total of 8 intraoperative spot films demonstrate a flexible endoscope in the descending duodenum with cannulation of the common bile duct. Cholangiogram demonstrates dilatation of the common bile duct. There appears to be a small filling defect in the distal common duct. Subsequent images demonstrate sphincterotomy, balloon sweep of the common duct and  placement of a plastic biliary stent. IMPRESSION: ERCP with sphincterotomy, balloon sweep of the common bile duct and placement of a plastic biliary stent. A small filling defect in the distal common bile duct suggests choledocholithiasis. These images were submitted for radiologic interpretation only. Please see the procedural report for the amount of contrast and the fluoroscopy time utilized. Electronically Signed   By: Jacqulynn Cadet M.D.   On: 06/11/2016 14:13    Anti-infectives: Anti-infectives    Start     Dose/Rate Route Frequency Ordered Stop   06/13/16 1400  piperacillin-tazobactam (ZOSYN) IVPB 2.25 g     2.25 g 100 mL/hr over 30 Minutes Intravenous Every 8 hours 06/13/16 1047     06/10/16 2000  vancomycin (VANCOCIN) IVPB 750 mg/150 ml premix     750 mg 150 mL/hr over 60 Minutes Intravenous Every 24 hours 06/10/16 1850     06/09/16 1400  piperacillin-tazobactam (ZOSYN) IVPB 3.375 g  Status:  Discontinued     3.375 g 12.5 mL/hr over 240 Minutes Intravenous Every 8 hours 06/09/16 1122 06/13/16 1047   06/09/16 0845  piperacillin-tazobactam (ZOSYN) IVPB 3.375 g     3.375 g 100 mL/hr over 30 Minutes Intravenous  Once 06/09/16 0836 06/09/16 0936      Assessment/Plan: Chronic cholecystitis with gallstones, CBD stone s/p ERCP and stent. Apparently he is not operative candidate per cardiology. If that is case then he has received primary treatment for passing stones but still is at risk of cholecystitis.  He does not have cholecystitis on his exam today although his  wbc is still up today.  If this remains case I think you can do hida scan to prove if he has cholecystitis prior to any perc chole.  I think without surgery and if he has chronic disease he will likely continue with pain.   Consider other sources of wbc elevation Will advance diet as of today [facility] 06/13/2016

## 2016-06-13 NOTE — Progress Notes (Signed)
This am  Patient cough up small blood x3 , also pt. Have skin tear R. Elbow oozing blood. [place] notified, pt on heparin drip , pharmacy said she will  notified MD. Will continue to monitor pt.

## 2016-06-14 ENCOUNTER — Ambulatory Visit: Payer: Self-pay | Admitting: *Deleted

## 2016-06-14 LAB — CBC WITH DIFFERENTIAL/PLATELET
Basophils Absolute: 0 10*3/uL (ref 0.0–0.1)
Basophils Relative: 0 %
EOS ABS: 0 10*3/uL (ref 0.0–0.7)
Eosinophils Relative: 0 %
HEMATOCRIT: 31 % — AB (ref 39.0–52.0)
HEMOGLOBIN: 10.8 g/dL — AB (ref 13.0–17.0)
LYMPHS ABS: 0.8 10*3/uL (ref 0.7–4.0)
LYMPHS PCT: 5 %
MCH: 31.8 pg (ref 26.0–34.0)
MCHC: 34.8 g/dL (ref 30.0–36.0)
MCV: 91.2 fL (ref 78.0–100.0)
MONOS PCT: 3 %
Monocytes Absolute: 0.6 10*3/uL (ref 0.1–1.0)
NEUTROS PCT: 92 %
Neutro Abs: 16 10*3/uL — ABNORMAL HIGH (ref 1.7–7.7)
Platelets: 75 10*3/uL — ABNORMAL LOW (ref 150–400)
RBC: 3.4 MIL/uL — ABNORMAL LOW (ref 4.22–5.81)
RDW: 13.4 % (ref 11.5–15.5)
WBC: 17.5 10*3/uL — ABNORMAL HIGH (ref 4.0–10.5)

## 2016-06-14 LAB — CBC
HCT: 31.1 % — ABNORMAL LOW (ref 39.0–52.0)
HEMOGLOBIN: 10.9 g/dL — AB (ref 13.0–17.0)
MCH: 32.1 pg (ref 26.0–34.0)
MCHC: 35 g/dL (ref 30.0–36.0)
MCV: 91.5 fL (ref 78.0–100.0)
PLATELETS: 82 10*3/uL — AB (ref 150–400)
RBC: 3.4 MIL/uL — AB (ref 4.22–5.81)
RDW: 13.4 % (ref 11.5–15.5)
WBC: 20.4 10*3/uL — AB (ref 4.0–10.5)

## 2016-06-14 LAB — GLUCOSE, CAPILLARY
GLUCOSE-CAPILLARY: 210 mg/dL — AB (ref 65–99)
GLUCOSE-CAPILLARY: 200 mg/dL — AB (ref 65–99)
Glucose-Capillary: 183 mg/dL — ABNORMAL HIGH (ref 65–99)
Glucose-Capillary: 190 mg/dL — ABNORMAL HIGH (ref 65–99)

## 2016-06-14 LAB — COMPREHENSIVE METABOLIC PANEL
ALBUMIN: 2 g/dL — AB (ref 3.5–5.0)
ALT: 171 U/L — AB (ref 17–63)
ALT: 171 U/L — AB (ref 17–63)
AST: 268 U/L — AB (ref 15–41)
AST: 266 U/L — AB (ref 15–41)
Albumin: 2 g/dL — ABNORMAL LOW (ref 3.5–5.0)
Alkaline Phosphatase: 161 U/L — ABNORMAL HIGH (ref 38–126)
Alkaline Phosphatase: 168 U/L — ABNORMAL HIGH (ref 38–126)
Anion gap: 17 — ABNORMAL HIGH (ref 5–15)
Anion gap: 17 — ABNORMAL HIGH (ref 5–15)
BUN: 100 mg/dL — AB (ref 6–20)
BUN: 106 mg/dL — ABNORMAL HIGH (ref 6–20)
CHLORIDE: 91 mmol/L — AB (ref 101–111)
CHLORIDE: 92 mmol/L — AB (ref 101–111)
CO2: 16 mmol/L — AB (ref 22–32)
CO2: 17 mmol/L — AB (ref 22–32)
CREATININE: 5.82 mg/dL — AB (ref 0.61–1.24)
Calcium: 7.4 mg/dL — ABNORMAL LOW (ref 8.9–10.3)
Calcium: 7.8 mg/dL — ABNORMAL LOW (ref 8.9–10.3)
Creatinine, Ser: 5.72 mg/dL — ABNORMAL HIGH (ref 0.61–1.24)
GFR calc Af Amer: 11 mL/min — ABNORMAL LOW (ref >60)
GFR calc non Af Amer: 10 mL/min — ABNORMAL LOW (ref >60)
GFR calc non Af Amer: 9 mL/min — ABNORMAL LOW (ref >60)
GFR, EST AFRICAN AMERICAN: 11 mL/min — AB (ref >60)
GLUCOSE: 268 mg/dL — AB (ref 65–99)
Glucose, Bld: 213 mg/dL — ABNORMAL HIGH (ref 65–99)
POTASSIUM: 4.9 mmol/L (ref 3.5–5.1)
POTASSIUM: 4.9 mmol/L (ref 3.5–5.1)
Sodium: 124 mmol/L — ABNORMAL LOW (ref 135–145)
Sodium: 126 mmol/L — ABNORMAL LOW (ref 135–145)
Total Bilirubin: 4.2 mg/dL — ABNORMAL HIGH (ref 0.3–1.2)
Total Bilirubin: 4.1 mg/dL — ABNORMAL HIGH (ref 0.3–1.2)
Total Protein: 7.4 g/dL (ref 6.5–8.1)
Total Protein: 7.3 g/dL (ref 6.5–8.1)

## 2016-06-14 LAB — HEPARIN LEVEL (UNFRACTIONATED): Heparin Unfractionated: 0.52 IU/mL (ref 0.30–0.70)

## 2016-06-14 NOTE — Progress Notes (Signed)
PROGRESS NOTE  [name]  MRN:[id number] DOB: 10/30/1956 DOA: 06/09/2016 PCP: FUSCO,LAWRENCE J., MD Outpatient Specialists:  Subjective: Symptomatically he feels much better, denies any abdominal pain or nausea/vomiting. He is anuric, urine output 350 mL/day, on NS at 75 mL/hour, weight is up 12 pounds since admission. Creatinine is worsening inspite of IV fluids, await nephrology recommendation.  Brief Narrative:  RUQ abdominal pain, has history of severe ischemic cardiomyopathy and rectal cancer status post partial colectomy and colostomy.  Assessment & Plan:   Active Problems:   History of rectal cancer   Ischemic cardiomyopathy-EF 35% 04/24/15 echo   CAD with LCX disease and stents to LAD   Chronic systolic heart failure (HCC)   Paroxysmal atrial fibrillation (HCC)   DM type 2, uncontrolled, with renal complications (HCC)   Gallbladder polyp   GERD (gastroesophageal reflux disease)   Orthostatic hypotension   OSA on CPAP   Hyperlipidemia   Transaminitis   Anemia of chronic disease   Right heart failure (HCC)   Cholecystitis   Biliary disease with obstruction   AKI (acute kidney injury) (HCC)    RUQ pain likely acute on chronic cholecystitis.  -Patient readmitted with same issues 6/5, not a surgical candidate at the time due to myriad of cardiac issues .  -Abd US today shows Gallstones and mild gallbladder wall thickening possibly reflecting cholecystitis. No pericholecystic fluid or positive sonographic Murphy's sign. Mild dilation of the common bile duct without evidence of commonduct stones.  -Total bilirubin 3.2. AST 354 ALT 177 lipase 78.  -HIDA scan done and showed indeterminate results secondary to lack of gallbladder filling. -ERCP done on 06/11/16 by Dr. Hayes, with sphincterotomy and placement of CBD stent. -Transaminases and total bilirubin improving after ERCP.  Likely early sepsis -WBC of 33.7, mild hypertension and likely acute on chronic  cholecystitis. -Patient is on Zosyn and vancomycin, blood cultures obtained. WBCs improving, continue current antibiotics.  Acute kidney injury on CKD stage IV baseline creatinine 2.6. -Was on diuretics at home, creatinine increase inconsistently, -Has a R AVF if needed for HD. -He is getting acidotic and the creatinine is worsening, nephrology consulted, started on IV fluids. -He is anuric with urine output of 350 per day, weight is increasing,? Hemodialysis.  Atrial Fibrillation CHA2DS2-VASc score 3, on anticoagulation with Eliquis -Rate is controlled, home medications continued -Eliquis on hold. Started on heparin drip  Chronic systolic heart failure, last echocardiogram 6/6 with Severely dilated cavity, mild LVH, EF 10-15 % Dry weight 82 kg, current weight 82.2 . Appears compensated.  - Daily weights, strict I/O  CAD, Tn 0.01 EKG Shows paced rhythm , last cardiac catheterization with stent in 2015, patient is cardiac pain free at this time. Continue meds   History of Hypotension stable for now BP 104/55 mmHg  Pulse 75  IVF on hold in the setting of severe CHF /Afib with risk of volume overload  Continue to monitor   Type II Diabetes Current blood sugar level is Glucose 445, Anion Gap 15, corrected 16.25. Hold home oral diabetic medications.  Lantus 1/2 dose bid, SSI (moderate) Accucheck q 4 h NPO for now, can advance to carb modified diet when ok with Surgery.  OSA on CPAP/COPD, not on acute exacerbation, stable  Continue CPAP and O2  Leukocytosis, likely reactive due to cholecystitis, pain, inflammation versus underlying infection versus possible cellulitic changes seen on exam on the R lower extremity. WBC 12.1 CXR without acute disease. Afebrile  Plays and Zosyn.   GERD, no   PROGRESS NOTE  [name]  MRN:[id number] DOB: 10/30/1956 DOA: 06/09/2016 PCP: FUSCO,LAWRENCE J., MD Outpatient Specialists:  Subjective: Symptomatically he feels much better, denies any abdominal pain or nausea/vomiting. He is anuric, urine output 350 mL/day, on NS at 75 mL/hour, weight is up 12 pounds since admission. Creatinine is worsening inspite of IV fluids, await nephrology recommendation.  Brief Narrative:  RUQ abdominal pain, has history of severe ischemic cardiomyopathy and rectal cancer status post partial colectomy and colostomy.  Assessment & Plan:   Active Problems:   History of rectal cancer   Ischemic cardiomyopathy-EF 35% 04/24/15 echo   CAD with LCX disease and stents to LAD   Chronic systolic heart failure (HCC)   Paroxysmal atrial fibrillation (HCC)   DM type 2, uncontrolled, with renal complications (HCC)   Gallbladder polyp   GERD (gastroesophageal reflux disease)   Orthostatic hypotension   OSA on CPAP   Hyperlipidemia   Transaminitis   Anemia of chronic disease   Right heart failure (HCC)   Cholecystitis   Biliary disease with obstruction   AKI (acute kidney injury) (HCC)    RUQ pain likely acute on chronic cholecystitis.  -Patient readmitted with same issues 6/5, not a surgical candidate at the time due to myriad of cardiac issues .  -Abd US today shows Gallstones and mild gallbladder wall thickening possibly reflecting cholecystitis. No pericholecystic fluid or positive sonographic Murphy's sign. Mild dilation of the common bile duct without evidence of commonduct stones.  -Total bilirubin 3.2. AST 354 ALT 177 lipase 78.  -HIDA scan done and showed indeterminate results secondary to lack of gallbladder filling. -ERCP done on 06/11/16 by Dr. Hayes, with sphincterotomy and placement of CBD stent. -Transaminases and total bilirubin improving after ERCP.  Likely early sepsis -WBC of 33.7, mild hypertension and likely acute on chronic  cholecystitis. -Patient is on Zosyn and vancomycin, blood cultures obtained. WBCs improving, continue current antibiotics.  Acute kidney injury on CKD stage IV baseline creatinine 2.6. -Was on diuretics at home, creatinine increase inconsistently, -Has a R AVF if needed for HD. -He is getting acidotic and the creatinine is worsening, nephrology consulted, started on IV fluids. -He is anuric with urine output of 350 per day, weight is increasing,? Hemodialysis.  Atrial Fibrillation CHA2DS2-VASc score 3, on anticoagulation with Eliquis -Rate is controlled, home medications continued -Eliquis on hold. Started on heparin drip  Chronic systolic heart failure, last echocardiogram 6/6 with Severely dilated cavity, mild LVH, EF 10-15 % Dry weight 82 kg, current weight 82.2 . Appears compensated.  - Daily weights, strict I/O  CAD, Tn 0.01 EKG Shows paced rhythm , last cardiac catheterization with stent in 2015, patient is cardiac pain free at this time. Continue meds   History of Hypotension stable for now BP 104/55 mmHg  Pulse 75  IVF on hold in the setting of severe CHF /Afib with risk of volume overload  Continue to monitor   Type II Diabetes Current blood sugar level is Glucose 445, Anion Gap 15, corrected 16.25. Hold home oral diabetic medications.  Lantus 1/2 dose bid, SSI (moderate) Accucheck q 4 h NPO for now, can advance to carb modified diet when ok with Surgery.  OSA on CPAP/COPD, not on acute exacerbation, stable  Continue CPAP and O2  Leukocytosis, likely reactive due to cholecystitis, pain, inflammation versus underlying infection versus possible cellulitic changes seen on exam on the R lower extremity. WBC 12.1 CXR without acute disease. Afebrile  Plays and Zosyn.   GERD, no   PROGRESS NOTE  [name]  MRN:[id number] DOB: 10/30/1956 DOA: 06/09/2016 PCP: FUSCO,LAWRENCE J., MD Outpatient Specialists:  Subjective: Symptomatically he feels much better, denies any abdominal pain or nausea/vomiting. He is anuric, urine output 350 mL/day, on NS at 75 mL/hour, weight is up 12 pounds since admission. Creatinine is worsening inspite of IV fluids, await nephrology recommendation.  Brief Narrative:  RUQ abdominal pain, has history of severe ischemic cardiomyopathy and rectal cancer status post partial colectomy and colostomy.  Assessment & Plan:   Active Problems:   History of rectal cancer   Ischemic cardiomyopathy-EF 35% 04/24/15 echo   CAD with LCX disease and stents to LAD   Chronic systolic heart failure (HCC)   Paroxysmal atrial fibrillation (HCC)   DM type 2, uncontrolled, with renal complications (HCC)   Gallbladder polyp   GERD (gastroesophageal reflux disease)   Orthostatic hypotension   OSA on CPAP   Hyperlipidemia   Transaminitis   Anemia of chronic disease   Right heart failure (HCC)   Cholecystitis   Biliary disease with obstruction   AKI (acute kidney injury) (HCC)    RUQ pain likely acute on chronic cholecystitis.  -Patient readmitted with same issues 6/5, not a surgical candidate at the time due to myriad of cardiac issues .  -Abd US today shows Gallstones and mild gallbladder wall thickening possibly reflecting cholecystitis. No pericholecystic fluid or positive sonographic Murphy's sign. Mild dilation of the common bile duct without evidence of commonduct stones.  -Total bilirubin 3.2. AST 354 ALT 177 lipase 78.  -HIDA scan done and showed indeterminate results secondary to lack of gallbladder filling. -ERCP done on 06/11/16 by Dr. Hayes, with sphincterotomy and placement of CBD stent. -Transaminases and total bilirubin improving after ERCP.  Likely early sepsis -WBC of 33.7, mild hypertension and likely acute on chronic  cholecystitis. -Patient is on Zosyn and vancomycin, blood cultures obtained. WBCs improving, continue current antibiotics.  Acute kidney injury on CKD stage IV baseline creatinine 2.6. -Was on diuretics at home, creatinine increase inconsistently, -Has a R AVF if needed for HD. -He is getting acidotic and the creatinine is worsening, nephrology consulted, started on IV fluids. -He is anuric with urine output of 350 per day, weight is increasing,? Hemodialysis.  Atrial Fibrillation CHA2DS2-VASc score 3, on anticoagulation with Eliquis -Rate is controlled, home medications continued -Eliquis on hold. Started on heparin drip  Chronic systolic heart failure, last echocardiogram 6/6 with Severely dilated cavity, mild LVH, EF 10-15 % Dry weight 82 kg, current weight 82.2 . Appears compensated.  - Daily weights, strict I/O  CAD, Tn 0.01 EKG Shows paced rhythm , last cardiac catheterization with stent in 2015, patient is cardiac pain free at this time. Continue meds   History of Hypotension stable for now BP 104/55 mmHg  Pulse 75  IVF on hold in the setting of severe CHF /Afib with risk of volume overload  Continue to monitor   Type II Diabetes Current blood sugar level is Glucose 445, Anion Gap 15, corrected 16.25. Hold home oral diabetic medications.  Lantus 1/2 dose bid, SSI (moderate) Accucheck q 4 h NPO for now, can advance to carb modified diet when ok with Surgery.  OSA on CPAP/COPD, not on acute exacerbation, stable  Continue CPAP and O2  Leukocytosis, likely reactive due to cholecystitis, pain, inflammation versus underlying infection versus possible cellulitic changes seen on exam on the R lower extremity. WBC 12.1 CXR without acute disease. Afebrile  Plays and Zosyn.   GERD, no

## 2016-06-14 NOTE — Progress Notes (Signed)
3 Days Post-Op  Subjective: Feeling "better than he has in days"  Objective: Vital signs in last 24 hours: Temp:  [97.4 F (36.3 C)-97.7 F (36.5 C)] 97.4 F (36.3 C) (07/10 0644) Pulse Rate:  [70-84] 70 (07/10 0644) Resp:  [16-18] 16 (07/10 0644) BP: (100-106)/(65-73) 106/68 mmHg (07/10 0644) SpO2:  [98 %-99 %] 99 % (07/10 0644) Weight:  [86.365 kg (190 lb 6.4 oz)] 86.365 kg (190 lb 6.4 oz) (07/10 0644) Last BM Date: 06/13/16  Intake/Output from previous day: 07/09 0701 - 07/10 0700 In: 1543.2 [P.O.:485; I.V.:708.2; IV Piggyback:350] Out: 350 [Urine:350] Intake/Output this shift:    Gen - alert Lungs - rales B GI - soft, NT  Lab Results:   Recent Labs  06/13/16 0736 06/14/16 0257  WBC 23.6* 20.4*  HGB 12.1* 10.9*  HCT 35.3* 31.1*  PLT 88* 82*   BMET  Recent Labs  06/13/16 0736 06/14/16 0257  NA 125* 124*  K 4.9 4.9  CL 89* 91*  CO2 17* 16*  GLUCOSE 341* 268*  BUN 93* 100*  CREATININE 5.47* 5.72*  CALCIUM 8.0* 7.4*   PT/INR No results for input(s): LABPROT, INR in the last 72 hours. ABG No results for input(s): PHART, HCO3 in the last 72 hours.  Invalid input(s): PCO2, PO2  Studies/Results: No results found.  Anti-infectives: Anti-infectives    Start     Dose/Rate Route Frequency Ordered Stop   06/13/16 2200  vancomycin (VANCOCIN) IVPB 750 mg/150 ml premix     750 mg 150 mL/hr over 60 Minutes Intravenous Every 24 hours 06/13/16 2124     06/13/16 1400  piperacillin-tazobactam (ZOSYN) IVPB 2.25 g     2.25 g 100 mL/hr over 30 Minutes Intravenous Every 8 hours 06/13/16 1047     06/10/16 2000  vancomycin (VANCOCIN) IVPB 750 mg/150 ml premix  Status:  Discontinued     750 mg 150 mL/hr over 60 Minutes Intravenous Every 24 hours 06/10/16 1850 06/13/16 1159   06/09/16 1400  piperacillin-tazobactam (ZOSYN) IVPB 3.375 g  Status:  Discontinued     3.375 g 12.5 mL/hr over 240 Minutes Intravenous Every 8 hours 06/09/16 1122 06/13/16 1047   06/09/16  0845  piperacillin-tazobactam (ZOSYN) IVPB 3.375 g     3.375 g 100 mL/hr over 30 Minutes Intravenous  Once 06/09/16 0836 06/09/16 0936      Assessment/Plan: s/p Procedure(s): ENDOSCOPIC RETROGRADE CHOLANGIOPANCREATOGRAPHY (ERCP) (N/A) Chronic cholecystitis with gallstones, CBD stone s/p ERCP and stent - continue ABX, GI following. If he can be improved from a CHF/medical standpoint he may see Nedra Hai at [facility] for consideration of cholecystectomy in 6 weeks. CHF - per cardiology, may do HD for fluid removal today, he has RUE fistula  LOS: 4 days    [name] E 06/14/2016

## 2016-06-14 NOTE — Progress Notes (Signed)
Advanced Heart Failure Rounding Note  Referring Physician: Dr Lily Kocher PCP: Dr. Gerarda Fraction Oncologist: Dr Benay Spice.  CHF: Bensimhon  Reason for Consultation: Cardiac consideration/clearance for GI surgery.   Subjective:    60 y/o with brittle systolic HF. EF 20% and CKD IV. Admitted 06/09/16 with recurrent biliary colic found to have biliary pancreatitis. Course c/b worsening renal failure  Underwent ERCP with stent placement on 7/7 with symptom improvement.   WBC 23.6 -> 20.4. Afebrile.  Denies SOB or CP. Wonders if he is approaching permanent dialysis.   Weight up 6 lbs with fluid resuscitation yesterday. Creatinine 2.70 -> 2.88 -> 3.98 -> 4.76 -> 5.47 -> 5.72  Objective:   Weight Range: 190 lb 6.4 oz (86.365 kg) Body mass index is 27.32 kg/(m^2).   Vital Signs:   Temp:  [97.4 F (36.3 C)-97.7 F (36.5 C)] 97.4 F (36.3 C) (07/10 0644) Pulse Rate:  [70-84] 70 (07/10 0644) Resp:  [16-18] 16 (07/10 0644) BP: (100-106)/(65-73) 106/68 mmHg (07/10 0644) SpO2:  [98 %-99 %] 99 % (07/10 0644) Weight:  [190 lb 6.4 oz (86.365 kg)] 190 lb 6.4 oz (86.365 kg) (07/10 0644) Last BM Date: 06/13/16  Weight change: Filed Weights   06/12/16 0500 06/13/16 0429 06/14/16 0644  Weight: 181 lb (82.101 kg) 184 lb (83.462 kg) 190 lb 6.4 oz (86.365 kg)    Intake/Output:   Intake/Output Summary (Last 24 hours) at 06/14/16 0739 Last data filed at 06/14/16 0639  Gross per 24 hour  Intake 1454.9 ml  Output    350 ml  Net 1104.9 ml     Physical Exam: General: NAD, HEENT: Scleral icterus, mild jaundice Neck: JVP 12 cm, no thyromegaly or nodule nodule.  No carotid bruit.  Lungs: CTAB, normal effort.  CV: Nondisplaced PMI. Regular S1S2, 2/6 SEM RUSB.  Abdomen: Soft, minimally tender,non-distended, no HSM appreciated. S/p colostomy.  Skin: Intact without lesions or rashes.  Neurologic: Alert and oriented x 3.  Psych: Normal affect. Extremities: No clubbing or cyanosis.   Trace ankle edema.Normal pedal pulses.    Telemetry: Reviewed personally, Vpaced rhythm 70s  Labs: CBC  Recent Labs  06/13/16 0736 06/14/16 0257  WBC 23.6* 20.4*  HGB 12.1* 10.9*  HCT 35.3* 31.1*  MCV 92.7 91.5  PLT 88* 82*   Basic Metabolic Panel  Recent Labs  06/13/16 0736 06/14/16 0257  NA 125* 124*  K 4.9 4.9  CL 89* 91*  CO2 17* 16*  GLUCOSE 341* 268*  BUN 93* 100*  CREATININE 5.47* 5.72*  CALCIUM 8.0* 7.4*   Liver Function Tests  Recent Labs  06/13/16 0736 06/14/16 0257  AST 225* 268*  ALT 159* 171*  ALKPHOS 188* 161*  BILITOT 5.6* 4.2*  PROT 7.3 7.4  ALBUMIN 2.1* 2.0*   No results for input(s): LIPASE, AMYLASE in the last 72 hours. Cardiac Enzymes No results for input(s): CKTOTAL, CKMB, CKMBINDEX, TROPONINI in the last 72 hours.  BNP: BNP (last 3 results)  Recent Labs  09/26/15 1820 05/09/16 2343 06/01/16 1200  BNP 1393.4* 377.6* 1016.7*    ProBNP (last 3 results) No results for input(s): PROBNP in the last 8760 hours.   D-Dimer No results for input(s): DDIMER in the last 72 hours. Hemoglobin A1C No results for input(s): HGBA1C in the last 72 hours. Fasting Lipid Panel No results for input(s): CHOL, HDL, LDLCALC, TRIG, CHOLHDL, LDLDIRECT in the last 72 hours. Thyroid Function Tests No results for input(s): TSH, T4TOTAL, T3FREE, THYROIDAB in the last 72 hours.  Invalid input(s): FREET3  Other results:     Imaging/Studies:  No results found.  Latest Echo  Latest Cath   Medications:     Scheduled Medications: . ferrous sulfate  325 mg Oral BID WC  . insulin aspart  0-15 Units Subcutaneous TID WC  . insulin glargine  15 Units Subcutaneous QHS  . pantoprazole  40 mg Oral Daily  . piperacillin-tazobactam (ZOSYN)  IV  2.25 g Intravenous Q8H  . sodium chloride flush  3 mL Intravenous Q12H  . vancomycin  750 mg Intravenous Q24H    Infusions: . sodium chloride 75 mL/hr at 06/13/16 2245  . heparin 600 Units/hr  (06/13/16 1800)    PRN Medications: bisacodyl, HYDROcodone-acetaminophen, magnesium citrate, morphine injection, morphine injection, ondansetron **OR** ondansetron (ZOFRAN) IV, senna-docusate, traZODone   Assessment / Plan   1. Acute on chronic cholecystitis - s/p ERCP  - WBCs remain high in 20s. Liver enzymes remains elevated though relatively stable - symptomatically improved.  2. Chronic systolic CHF - ICM - Echo 05/11/16 with LVEF 10-15%, mild AI, mild MR, normal RV.  - Volume status trending up with ARF.  Antihypertensives and diuretics held with same. - Creatinine worsening despite fluid replacement and volume status trending up. Suspect he will need RRT.  Would stop IV fluid as soon as feasible.  3. Chronic Afib with CHADS2VASC score of 5. S/p AV node ablation V-paced  4. CAD s/p NSTEMI in 04/20/16 - No CP.  5. AKI on CKD stage IV s/p AVF in RUE  - Creatinine now > 5.  Nephrology on board.  - Pt has good AVF in R arm.  Suspect he will need RRT while in house.  6. H/o rectal cancer 2008: Has diverting colostomy. He has follow up with Dr Benay Spice. - Recent CT (05/02/16) with no evidence of recurrence. 7. HLD.  Satira Mccallum Tillery PA-C 06/14/2016, 7:39 AM    Advanced Heart Failure Team Pager [phone] (M-F; 7a - 4p)  Please contact [facility] for night-coverage after hours (4p -7a ) and weekends on [email or web address]  Patient seen with PA, agree with the above note.  Symptomatically stable.  Has some volume overload on exam, IV fluid stopped.  Creatinine continues to rise.  Seen by renal, possible HD tomorrow if not improved.  Has right arm AV fistula.   No abdominal pain, tolerating full liquid diet s/p ERCP/biliary stent placement.   Loralie Champagne 06/14/2016

## 2016-06-14 NOTE — Progress Notes (Signed)
Admit: 06/09/2016 LOS: 4  [name] with AoCKD4, cSHF 2/2 ICM, cholecystitis s/p ERCP, chronic AFib,  Subjective:  GFR worsened despite hydration; Na level falling Weigh up 9-12lb from admission and 6 from yesterady if accurate   07/09 0701 - 07/10 0700 In: 1543.2 [P.O.:485; I.V.:708.2; IV Piggyback:350] Out: 350 [Urine:350]  Filed Weights   06/12/16 0500 06/13/16 0429 06/14/16 0644  Weight: 82.101 kg (181 lb) 83.462 kg (184 lb) 86.365 kg (190 lb 6.4 oz)    Scheduled Meds: . ferrous sulfate  325 mg Oral BID WC  . insulin aspart  0-15 Units Subcutaneous TID WC  . insulin glargine  15 Units Subcutaneous QHS  . pantoprazole  40 mg Oral Daily  . piperacillin-tazobactam (ZOSYN)  IV  2.25 g Intravenous Q8H  . sodium chloride flush  3 mL Intravenous Q12H  . vancomycin  750 mg Intravenous Q24H   Continuous Infusions: . sodium chloride 75 mL/hr at 06/13/16 2245  . heparin 600 Units/hr (06/13/16 1800)   PRN Meds:.bisacodyl, HYDROcodone-acetaminophen, magnesium citrate, morphine injection, morphine injection, ondansetron **OR** ondansetron (ZOFRAN) IV, senna-docusate, traZODone  Current Labs: reviewed    Physical Exam:  Blood pressure 97/63, pulse 77, temperature 97.4 F (36.3 C), temperature source Oral, resp. rate 16, height 5\' 10"  (1.778 m), weight 86.365 kg (190 lb 6.4 oz), SpO2 98 %. Gen alert, no distress, up in chair, calm No rash, cyanosis or gangrene Sclera anicteric, throat clear  No jvd or bruits, flat neck veins Chest clear bilat RRR 2/6 sem no RG Abd soft ntnd no mass or ascites +bs LLQ colostomy GU normal male MS no joint effusions or deformity Ext no LE or UE edema / no wounds or ulcers Neuro is alert, Ox 3 , nf R arm AVF +bruit  A 1. AoCKD4 BL SCr 2.0-3.0 likely 2/2 acute illness / ATN; didn't improve with fluid challenge 2. sCHF 2/2 ICM 3. Cholecystitis s/p ERCP 7/7 4. Presence of mature AVF 5. Hx/o rectal cancer 6. Chronic AFib now on hep gtt, NOAC at  admission  P 1. Hold IVFs now 2. If not improved tomorrow plan for HD   Pearson Grippe MD 06/14/2016, 9:20 AM   Recent Labs Lab 06/12/16 0253 06/13/16 0736 06/14/16 0257  NA 131* 125* 124*  K 4.9 4.9 4.9  CL 93* 89* 91*  CO2 20* 17* 16*  GLUCOSE 326* 341* 268*  BUN 69* 93* 100*  CREATININE 4.76* 5.47* 5.72*  CALCIUM 8.5* 8.0* 7.4*    Recent Labs Lab 06/12/16 1122 06/13/16 0736 06/14/16 0257  WBC 21.8* 23.6* 20.4*  HGB 12.5* 12.1* 10.9*  HCT 37.4* 35.3* 31.1*  MCV 94.7 92.7 91.5  PLT 90* 88* 82*

## 2016-06-14 NOTE — Progress Notes (Signed)
Inpatient Diabetes Program Recommendations  AACE/ADA: New Consensus Statement on Inpatient Glycemic Control (2015)  Target Ranges:  Prepandial:   less than 140 mg/dL      Peak postprandial:   less than 180 mg/dL (1-2 hours)      Critically ill patients:  140 - 180 mg/dL   Lab Results  Component Value Date   GLUCAP 200* 06/14/2016   HGBA1C 11.2* 06/09/2016    Review of Glycemic Control:  Results for [name], [name] (MRN [id number]) as of 06/14/2016 12:06  Ref. Range 06/13/2016 11:22 06/13/2016 16:00 06/13/2016 21:18 06/14/2016 06:36 06/14/2016 11:39  Glucose-Capillary Latest Ref Range: 65-99 mg/dL 303 (H) 320 (H) 318 (H) 210 (H) 200 (H)   Diabetes history: Type 2 diabetes Outpatient Diabetes medications: Lantus 30 units q AM Current orders for Inpatient glycemic control:  Lantus 15 units q HS, Novolog moderate tid with meals  Inpatient Diabetes Program Recommendations:   Please increase Lantus to 25 units q HS.  Thanks, Adah Perl, RN, BC-ADM Inpatient Diabetes Coordinator Pager [phone] (8a-5p)

## 2016-06-14 NOTE — Progress Notes (Signed)
ANTICOAGULATION CONSULT NOTE - Follow Up Consult  Pharmacy Consult for heparin Indication: atrial fibrillation  No Known Allergies  Patient Measurements: Height: 5\' 10"  (177.8 cm) Weight: 190 lb 6.4 oz (86.365 kg) IBW/kg (Calculated) : 73 Heparin Dosing Weight: 82 kg  Vital Signs: Temp: 97.6 F (36.4 C) (07/10 1147) Temp Source: Oral (07/10 1147) BP: 107/64 mmHg (07/10 1147) Pulse Rate: 73 (07/10 1147)  Labs:  Recent Labs  06/13/16 0736 06/13/16 1805 06/14/16 0257 06/14/16 0958  HGB 12.1*  --  10.9* 10.8*  HCT 35.3*  --  31.1* 31.0*  PLT 88*  --  82* 75*  HEPARINUNFRC 0.63 0.49 0.52  --   CREATININE 5.47*  --  5.72* 5.82*    Estimated Creatinine Clearance: 13.8 mL/min (by C-G formula based on Cr of 5.82).   Medications:  Infusions:  . heparin 600 Units/hr (06/13/16 1800)    Assessment: 59yom admitted with acute cholecystis s/p ERCP and stent placed. Hx Afib with CVR 70s on metoprolol. PTA apixaban held on admit for possible surgery and remains on hold until patient more stable. Last apixaban dose 7/4 PTA. Will plan to bridge with heparin for stroke prevention until apixaban restarted. Will not bolus and will keep HL at low end of goal to decrease bleeding risk.   HL therapeutic (0.5) on 600 units/h. Hgb down to 10 x2 this am, platelets are low at 75 but stable.  No plans per renal to dialyze today.  Goal of Therapy:  Heparin level 0.3-0.5 units/ml Monitor platelets by anticoagulation protocol: Yes   Plan:  - Continue heparin drip at current rate for now - Daily HL and CBC - Monitor for s/sx of bleeding  Erin Hearing PharmD., BCPS Clinical Pharmacist Pager [phone] 06/14/2016 12:02 PM

## 2016-06-15 DIAGNOSIS — I5023 Acute on chronic systolic (congestive) heart failure: Secondary | ICD-10-CM | POA: Diagnosis present

## 2016-06-15 LAB — CBC
HCT: 31.7 % — ABNORMAL LOW (ref 39.0–52.0)
HEMOGLOBIN: 10.9 g/dL — AB (ref 13.0–17.0)
MCH: 31.1 pg (ref 26.0–34.0)
MCHC: 34.4 g/dL (ref 30.0–36.0)
MCV: 90.3 fL (ref 78.0–100.0)
Platelets: 75 10*3/uL — ABNORMAL LOW (ref 150–400)
RBC: 3.51 MIL/uL — AB (ref 4.22–5.81)
RDW: 13.2 % (ref 11.5–15.5)
WBC: 15 10*3/uL — AB (ref 4.0–10.5)

## 2016-06-15 LAB — HEPARIN LEVEL (UNFRACTIONATED): HEPARIN UNFRACTIONATED: 0.47 [IU]/mL (ref 0.30–0.70)

## 2016-06-15 LAB — GLUCOSE, CAPILLARY
GLUCOSE-CAPILLARY: 172 mg/dL — AB (ref 65–99)
GLUCOSE-CAPILLARY: 176 mg/dL — AB (ref 65–99)
GLUCOSE-CAPILLARY: 198 mg/dL — AB (ref 65–99)
GLUCOSE-CAPILLARY: 212 mg/dL — AB (ref 65–99)
Glucose-Capillary: 129 mg/dL — ABNORMAL HIGH (ref 65–99)
Glucose-Capillary: 183 mg/dL — ABNORMAL HIGH (ref 65–99)

## 2016-06-15 LAB — COMPREHENSIVE METABOLIC PANEL
ALK PHOS: 160 U/L — AB (ref 38–126)
ALT: 150 U/L — ABNORMAL HIGH (ref 17–63)
ANION GAP: 18 — AB (ref 5–15)
AST: 200 U/L — ABNORMAL HIGH (ref 15–41)
Albumin: 2 g/dL — ABNORMAL LOW (ref 3.5–5.0)
BILIRUBIN TOTAL: 4.1 mg/dL — AB (ref 0.3–1.2)
BUN: 120 mg/dL — ABNORMAL HIGH (ref 6–20)
CALCIUM: 8.5 mg/dL — AB (ref 8.9–10.3)
CO2: 16 mmol/L — ABNORMAL LOW (ref 22–32)
Chloride: 90 mmol/L — ABNORMAL LOW (ref 101–111)
Creatinine, Ser: 6.07 mg/dL — ABNORMAL HIGH (ref 0.61–1.24)
GFR calc non Af Amer: 9 mL/min — ABNORMAL LOW (ref >60)
GFR, EST AFRICAN AMERICAN: 10 mL/min — AB (ref >60)
Glucose, Bld: 185 mg/dL — ABNORMAL HIGH (ref 65–99)
Potassium: 5.3 mmol/L — ABNORMAL HIGH (ref 3.5–5.1)
Sodium: 124 mmol/L — ABNORMAL LOW (ref 135–145)
TOTAL PROTEIN: 7.2 g/dL (ref 6.5–8.1)

## 2016-06-15 LAB — HEPATITIS B SURFACE ANTIGEN: HEP B S AG: NEGATIVE

## 2016-06-15 MED ORDER — VANCOMYCIN HCL IN DEXTROSE 750-5 MG/150ML-% IV SOLN
750.0000 mg | Freq: Once | INTRAVENOUS | Status: DC
  Filled 2016-06-15: qty 150

## 2016-06-15 MED ORDER — VANCOMYCIN HCL 500 MG IV SOLR
500.0000 mg | Freq: Once | INTRAVENOUS | Status: DC
  Filled 2016-06-15: qty 500

## 2016-06-15 MED ORDER — AMOXICILLIN-POT CLAVULANATE 500-125 MG PO TABS
1.0000 | ORAL_TABLET | Freq: Every day | ORAL | Status: DC
  Administered 2016-06-15 – 2016-06-20 (×6): 500 mg via ORAL
  Filled 2016-06-15 (×6): qty 1

## 2016-06-15 MED ORDER — INSULIN ASPART 100 UNIT/ML ~~LOC~~ SOLN
0.0000 [IU] | Freq: Three times a day (TID) | SUBCUTANEOUS | Status: DC
  Administered 2016-06-16 (×2): 2 [IU] via SUBCUTANEOUS
  Administered 2016-06-16: 3 [IU] via SUBCUTANEOUS
  Administered 2016-06-18: 2 [IU] via SUBCUTANEOUS
  Administered 2016-06-18: 3 [IU] via SUBCUTANEOUS
  Administered 2016-06-19 (×3): 2 [IU] via SUBCUTANEOUS
  Administered 2016-06-20 (×2): 3 [IU] via SUBCUTANEOUS
  Administered 2016-06-20 – 2016-06-21 (×2): 2 [IU] via SUBCUTANEOUS

## 2016-06-15 NOTE — Progress Notes (Signed)
PROGRESS NOTE  [name]  ZOX:[id number] DOB: Apr 17, 1956 DOA: 06/09/2016 PCP: Cassell Smiles., MD Outpatient Specialists:  Subjective: Continues to have low urine output (495 mL/day) and worsening of his weight and renal function (creatinine is 6.0) Seen by nephrology, for dialysis today. Denies any abdominal pain, will advance his diet.  Brief Narrative:  RUQ abdominal pain, has history of severe ischemic cardiomyopathy and rectal cancer status post partial colectomy and colostomy.  Assessment & Plan:   Active Problems:   History of rectal cancer   Ischemic cardiomyopathy-EF 35% 04/24/15 echo   CAD with LCX disease and stents to LAD   Chronic systolic heart failure (HCC)   Paroxysmal atrial fibrillation (HCC)   DM type 2, uncontrolled, with renal complications (HCC)   Gallbladder polyp   GERD (gastroesophageal reflux disease)   Orthostatic hypotension   OSA on CPAP   Hyperlipidemia   Transaminitis   Anemia of chronic disease   Right heart failure (HCC)   Cholecystitis   Biliary disease with obstruction   AKI (acute kidney injury) (HCC)    RUQ pain likely acute on chronic cholecystitis.  -Patient readmitted with same issues 6/5, not a surgical candidate at the time due to myriad of cardiac issues .  -Abd [place] today shows Gallstones and mild gallbladder wall thickening possibly reflecting cholecystitis. No pericholecystic fluid or positive sonographic Murphy's sign. Mild dilation of the common bile duct without evidence of commonduct stones.  -Total bilirubin 3.2. AST 354 ALT 177 lipase 78.  -HIDA scan done and showed indeterminate results secondary to lack of gallbladder filling. -ERCP done on 06/11/16 by Dr. Madilyn Fireman, with sphincterotomy and placement of CBD stent. -Transaminases and total bilirubin improving after ERCP. -Is on full liquid diet, tolerating that very well for 2 days, all advance to renal diet.  Likely early sepsis -WBC of 33.7, mild hypertension and  likely acute on chronic cholecystitis. -Patient is on Zosyn and vancomycin, blood cultures obtained. WBCs improving, continue current antibiotics. -Will switch to oral Augmentin.  Acute kidney injury on CKD stage IV baseline creatinine 2.6. -Was on diuretics at home, creatinine increase inconsistently, -Has a R AVF if needed for HD. -He is getting acidotic and the creatinine is worsening, nephrology consulted. -Seen by nephrology today, for hemodialysis.  Atrial Fibrillation CHA2DS2-VASc score 3, on anticoagulation with Eliquis -Rate is controlled, home medications continued -Eliquis on hold. Started on heparin drip  Chronic systolic heart failure, last echocardiogram 6/6 with Severely dilated cavity, mild LVH, EF 10-15 % Dry weight 82 kg, current weight 82.2 . Appears compensated.  - Daily weights, strict I/O  CAD, Tn 0.01 EKG Shows paced rhythm , last cardiac catheterization with stent in 2015, patient is cardiac pain free at this time. Continue meds   History of Hypotension stable for now BP 104/55 mmHg  Pulse 75  IVF on hold in the setting of severe CHF /Afib with risk of volume overload  Continue to monitor   Type II Diabetes Current blood sugar level is Glucose 445, Anion Gap 15, corrected 16.25. Hold home oral diabetic medications.  Lantus 1/2 dose bid, SSI (moderate) Accucheck q 4 h NPO for now, can advance to carb modified diet when ok with Surgery.  OSA on CPAP/COPD, not on acute exacerbation, stable  Continue CPAP and O2  Leukocytosis, likely reactive due to cholecystitis, pain, inflammation versus underlying infection versus possible cellulitic changes seen on exam on the R lower extremity. WBC 12.1 CXR without acute disease. Afebrile  Plays and Zosyn.  GERD, no acute symptoms: Continue PPI  History of Rectal Cancer, s/p chemo/ XRT No acute changes. Can follow as OP  Consult to Left colostomy care   Elevated lipase -Acute  pancreatitis versus elevated lipase from acute renal failure. -Anyway patient is nothing by mouth, if pain not improved can obtain CT of abdomen/pelvis.   DVT prophylaxis: SCDs now on heparin drip. Code Status: Full Code Family Communication:  Disposition Plan:  Diet: Diet full liquid Room service appropriate?: Yes; Fluid consistency:: Thin  Consultants:   None  Procedures:   None  Antimicrobials:   Zosyn and vancomycin  Objective: Filed Vitals:   06/14/16 1147 06/14/16 2021 06/15/16 0420 06/15/16 1232  BP: 107/64 106/68 145/67 101/62  Pulse: 73 77 76 71  Temp: 97.6 F (36.4 C) 97 F (36.1 C) 97.2 F (36.2 C) 98.2 F (36.8 C)  TempSrc: Oral Oral Oral Oral  Resp: 16 18 18 20   Height:      Weight:   87.091 kg (192 lb)   SpO2: 97% 98% 98% 100%    Intake/Output Summary (Last 24 hours) at 06/15/16 1427 Last data filed at 06/15/16 1403  Gross per 24 hour  Intake   1066 ml  Output    375 ml  Net    691 ml   Filed Weights   06/13/16 0429 06/14/16 0644 06/15/16 0420  Weight: 83.462 kg (184 lb) 86.365 kg (190 lb 6.4 oz) 87.091 kg (192 lb)    Examination: General exam: Appears calm and comfortable  Respiratory system: Clear to auscultation. Respiratory effort normal. Cardiovascular system: S1 & S2 heard, RRR. No JVD, murmurs, rubs, gallops or clicks. No pedal edema. Gastrointestinal system: Abdomen is nondistended, soft and nontender. No organomegaly or masses felt. Normal bowel sounds heard. Central nervous system: Alert and oriented. No focal neurological deficits. Extremities: Symmetric 5 x 5 power. Skin: No rashes, lesions or ulcers Psychiatry: Judgement and insight appear normal. Mood & affect appropriate.   Data Reviewed: I have personally reviewed following labs and imaging studies  CBC:  Recent Labs Lab 06/12/16 1122 06/13/16 0736 06/14/16 0257 06/14/16 0958 06/15/16 0210  WBC 21.8* 23.6* 20.4* 17.5* 15.0*  NEUTROABS  --   --   --  16.0*  --     HGB 12.5* 12.1* 10.9* 10.8* 10.9*  HCT 37.4* 35.3* 31.1* 31.0* 31.7*  MCV 94.7 92.7 91.5 91.2 90.3  PLT 90* 88* 82* 75* 75*   Basic Metabolic Panel:  Recent Labs Lab 06/10/16 0246  06/12/16 0253 06/13/16 0736 06/14/16 0257 06/14/16 0958 06/15/16 0210  NA 134*  < > 131* 125* 124* 126* 124*  K 4.0  < > 4.9 4.9 4.9 4.9 5.3*  CL 94*  < > 93* 89* 91* 92* 90*  CO2 31  < > 20* 17* 16* 17* 16*  GLUCOSE 188*  < > 326* 341* 268* 213* 185*  BUN 41*  < > 69* 93* 100* 106* 120*  CREATININE 2.88*  < > 4.76* 5.47* 5.72* 5.82* 6.07*  CALCIUM 10.2  < > 8.5* 8.0* 7.4* 7.8* 8.5*  MG 2.2  --   --   --   --   --   --   < > = values in this interval not displayed. GFR: Estimated Creatinine Clearance: 13.2 mL/min (by C-G formula based on Cr of 6.07). Liver Function Tests:  Recent Labs Lab 06/12/16 0253 06/13/16 0736 06/14/16 0257 06/14/16 0958 06/15/16 0210  AST 285* 225* 268* 266* 200*  ALT 176* 159* 171*  171* 150*  ALKPHOS 212* 188* 161* 168* 160*  BILITOT 8.9* 5.6* 4.2* 4.1* 4.1*  PROT 7.1 7.3 7.4 7.3 7.2  ALBUMIN 2.0* 2.1* 2.0* 2.0* 2.0*    Recent Labs Lab 06/09/16 0759 06/11/16 0732  LIPASE 78* 396*   No results for input(s): AMMONIA in the last 168 hours. Coagulation Profile:  Recent Labs Lab 06/09/16 1041 06/10/16 0246  INR 1.27 1.60*   Cardiac Enzymes:  Recent Labs Lab 06/09/16 1041 06/09/16 1757 06/09/16 2153  TROPONINI 0.04* 0.04* 0.05*   BNP (last 3 results) No results for input(s): PROBNP in the last 8760 hours. HbA1C: No results for input(s): HGBA1C in the last 72 hours. CBG:  Recent Labs Lab 06/14/16 2045 06/15/16 0046 06/15/16 0413 06/15/16 0746 06/15/16 1142  GLUCAP 190* 198* 212* 172* 176*   Lipid Profile: No results for input(s): CHOL, HDL, LDLCALC, TRIG, CHOLHDL, LDLDIRECT in the last 72 hours. Thyroid Function Tests: No results for input(s): TSH, T4TOTAL, FREET4, T3FREE, THYROIDAB in the last 72 hours. Anemia Panel: No results for  input(s): VITAMINB12, FOLATE, FERRITIN, TIBC, IRON, RETICCTPCT in the last 72 hours. Urine analysis:    Component Value Date/Time   COLORURINE YELLOW 06/09/2016 1023   APPEARANCEUR CLEAR 06/09/2016 1023   LABSPEC 1.017 06/09/2016 1023   PHURINE 5.5 06/09/2016 1023   GLUCOSEU >1000* 06/09/2016 1023   HGBUR NEGATIVE 06/09/2016 1023   BILIRUBINUR NEGATIVE 06/09/2016 1023   KETONESUR NEGATIVE 06/09/2016 1023   PROTEINUR NEGATIVE 06/09/2016 1023   UROBILINOGEN 1.0 08/23/2015 0322   NITRITE NEGATIVE 06/09/2016 1023   LEUKOCYTESUR NEGATIVE 06/09/2016 1023   Sepsis Labs: @LABRCNTIP (procalcitonin:4,lacticidven:4)  ) Recent Results (from the past 240 hour(s))  Culture, blood (Routine X 2) w Reflex to ID Panel     Status: None (Preliminary result)   Collection Time: 06/11/16  7:25 AM  Result Value Ref Range Status   Specimen Description BLOOD LEFT HAND  Final   Special Requests BOTTLES DRAWN AEROBIC AND ANAEROBIC  5CC  Final   Culture NO GROWTH 3 DAYS  Final   Report Status PENDING  Incomplete  Culture, blood (Routine X 2) w Reflex to ID Panel     Status: None (Preliminary result)   Collection Time: 06/11/16  7:29 AM  Result Value Ref Range Status   Specimen Description BLOOD LEFT HAND  Final   Special Requests BOTTLES DRAWN AEROBIC ONLY  5CC  Final   Culture NO GROWTH 3 DAYS  Final   Report Status PENDING  Incomplete     Invalid input(s): PROCALCITONIN, LACTICACIDVEN   Radiology Studies: No results found.      Scheduled Meds: . ferrous sulfate  325 mg Oral BID WC  . insulin aspart  0-15 Units Subcutaneous TID WC  . insulin glargine  15 Units Subcutaneous QHS  . pantoprazole  40 mg Oral Daily  . piperacillin-tazobactam (ZOSYN)  IV  2.25 g Intravenous Q8H  . sodium chloride flush  3 mL Intravenous Q12H  . vancomycin  500 mg Intravenous Once   Continuous Infusions: . heparin 600 Units/hr (06/13/16 1800)     LOS: 5 days    Time spent: 35 minutes    Maryjayne Kleven  A, MD [facility] Pager [phone]  If 7PM-7AM, please contact night-coverage [email or web address] Password [facility] 06/15/2016, 2:27 PM

## 2016-06-15 NOTE — Progress Notes (Signed)
Pharmacy Antibiotic Note  [name] is a 60 y.o. male admitted on 06/09/2016 with cholecystitis.  Pharmacy has been consulted for vancomycin dosing.  Patient with leukocytosis from cholecystitis and suspected early sepsis. SCr has now trended up to 6 today, with elevated WBC 15, remains afebrile, s/p ERCP and stent placed 7/7.   VT 18 on 7/9 and dosing continued, patient now anuric with plans for HD for 2 hours today. Vancomycin received last night, will give small post-HD dose this afternoon and check random level in am.  Plan: -Vancomycin 500mg  tonight after HD -Zosyn 3.375g IV q8h (4 hour infusion).  Height: 5\' 10"  (177.8 cm) Weight: 192 lb (87.091 kg) (scale a) IBW/kg (Calculated) : 73  Temp (24hrs), Avg:97.5 F (36.4 C), Min:97 F (36.1 C), Max:98.2 F (36.8 C)   Recent Labs Lab 06/11/16 2004 06/12/16 0253 06/12/16 1122 06/13/16 0736 06/13/16 2008 06/14/16 0257 06/14/16 0958 06/15/16 0210  WBC  --  22.3* 21.8* 23.6*  --  20.4* 17.5* 15.0*  CREATININE  --  4.76*  --  5.47*  --  5.72* 5.82* 6.07*  VANCOTROUGH  --   --   --   --  18  --   --   --   VANCORANDOM 11  --   --   --   --   --   --   --     Estimated Creatinine Clearance: 13.2 mL/min (by C-G formula based on Cr of 6.07).    No Known Allergies  Antimicrobials this admission: Zosyn 7/5 >>  Vancomycin 7/6 >>   Dose adjustments this admission: 7/7 VR = 11 after one dose of 750mg  7/9 VT 18 after 3 doses 750 mg  Microbiology results: 7/7 BCx: ngtd  Erin Hearing PharmD., BCPS Clinical Pharmacist Pager [phone] 06/15/2016 1:20 PM

## 2016-06-15 NOTE — Progress Notes (Signed)
ANTICOAGULATION CONSULT NOTE - Follow Up Consult  Pharmacy Consult for heparin Indication: atrial fibrillation  No Known Allergies  Patient Measurements: Height: 5\' 10"  (177.8 cm) Weight: 192 lb (87.091 kg) (scale a) IBW/kg (Calculated) : 73 Heparin Dosing Weight: 82 kg  Vital Signs: Temp: 98.2 F (36.8 C) (07/11 1232) Temp Source: Oral (07/11 1232) BP: 101/62 mmHg (07/11 1232) Pulse Rate: 71 (07/11 1232)  Labs:  Recent Labs  06/13/16 1805 06/14/16 0257 06/14/16 0958 06/15/16 0210  HGB  --  10.9* 10.8* 10.9*  HCT  --  31.1* 31.0* 31.7*  PLT  --  82* 75* 75*  HEPARINUNFRC 0.49 0.52  --  0.47  CREATININE  --  5.72* 5.82* 6.07*    Estimated Creatinine Clearance: 13.2 mL/min (by C-G formula based on Cr of 6.07).   Medications:  Infusions:  . heparin 600 Units/hr (06/13/16 1800)    Assessment: 59yom admitted with acute cholecystis s/p ERCP and stent placed. Hx Afib with CVR 70s on metoprolol. PTA apixaban held on admit for possible surgery and remains on hold until patient more stable. Last apixaban dose 7/4 PTA. Will plan to bridge with heparin for stroke prevention until apixaban restarted. Will not bolus and will keep HL at low end of goal to decrease bleeding risk.   HL therapeutic (0.4) on 600 units/h. Hgb 10s this am, platelets are low at 75 but stable.  Goal of Therapy:  Heparin level 0.3-0.5 units/ml Monitor platelets by anticoagulation protocol: Yes   Plan:  - Continue heparin drip at current rate for now - Daily HL and CBC - Monitor for s/sx of bleeding  Erin Hearing PharmD., BCPS Clinical Pharmacist Pager [phone] 06/15/2016 12:55 PM

## 2016-06-15 NOTE — Progress Notes (Signed)
CM following for DCP. B Anaija Wissink RN,MHA,BSN [phone] 

## 2016-06-15 NOTE — Progress Notes (Signed)
1415 report given to dana HD rn  1450 transported by [facility] transporter to dialysis unit via bed with cardiac monitor on and iv heparin on progress.Pt aware

## 2016-06-15 NOTE — Progress Notes (Signed)
Advanced Heart Failure Rounding Note  Referring Physician: Dr Lily Kocher PCP: Dr. Gerarda Fraction Oncologist: Dr Benay Spice.  CHF: Bensimhon  Reason for Consultation: Cardiac consideration/clearance for GI surgery.   Subjective:    60 y/o with brittle systolic HF. EF 20% and CKD IV. Admitted 06/09/16 with recurrent biliary colic found to have biliary pancreatitis. Course c/b worsening renal failure  Underwent ERCP with stent placement on 7/7 with symptom improvement.   WBC 23.6 -> 20.4 -> 15.0. Afebrile.  Abdominal pain much improved. Tolerating diet.  Just had large BM that helped even further. Is slightly upset to be starting dialysis today, as may be permanent.  Denies SOB or CP.   Weight up another 2 lbs. Creatinine 2.70 -> 2.88 -> 3.98 -> 4.76 -> 5.47 -> 5.72 -> 6.07  Objective:   Weight Range: 192 lb (87.091 kg) Body mass index is 27.55 kg/(m^2).   Vital Signs:   Temp:  [97 F (36.1 C)-97.6 F (36.4 C)] 97.2 F (36.2 C) (07/11 0420) Pulse Rate:  [73-77] 76 (07/11 0420) Resp:  [16-18] 18 (07/11 0420) BP: (106-145)/(64-68) 145/67 mmHg (07/11 0420) SpO2:  [97 %-98 %] 98 % (07/11 0420) Weight:  [192 lb (87.091 kg)] 192 lb (87.091 kg) (07/11 0420) Last BM Date: 06/14/16  Weight change: Filed Weights   06/13/16 0429 06/14/16 0644 06/15/16 0420  Weight: 184 lb (83.462 kg) 190 lb 6.4 oz (86.365 kg) 192 lb (87.091 kg)    Intake/Output:   Intake/Output Summary (Last 24 hours) at 06/15/16 0827 Last data filed at 06/15/16 0806  Gross per 24 hour  Intake   1086 ml  Output    475 ml  Net    611 ml     Physical Exam: General: NAD, HEENT: Scleral icterus, mild jaundice Neck: JVP 12 cm, no thyromegaly or nodule nodule.  No carotid bruit.  Lungs: CTAB, normal effort.  CV: Nondisplaced PMI. Regular S1S2, 2/6 SEM RUSB.  Abdomen: Soft, minimally tender,non-distended, no HSM appreciated. S/p colostomy.  Skin: Intact without lesions or rashes.  Neurologic: Alert and  oriented x 3.  Psych: Normal affect. Extremities: No clubbing or cyanosis.  Trace ankle edema.Normal pedal pulses.    Telemetry: Reviewed personally, Vpaced rhythm 70s  Labs: CBC  Recent Labs  06/14/16 0958 06/15/16 0210  WBC 17.5* 15.0*  NEUTROABS 16.0*  --   HGB 10.8* 10.9*  HCT 31.0* 31.7*  MCV 91.2 90.3  PLT 75* 75*   Basic Metabolic Panel  Recent Labs  06/14/16 0958 06/15/16 0210  NA 126* 124*  K 4.9 5.3*  CL 92* 90*  CO2 17* 16*  GLUCOSE 213* 185*  BUN 106* 120*  CREATININE 5.82* 6.07*  CALCIUM 7.8* 8.5*   Liver Function Tests  Recent Labs  06/14/16 0958 06/15/16 0210  AST 266* 200*  ALT 171* 150*  ALKPHOS 168* 160*  BILITOT 4.1* 4.1*  PROT 7.3 7.2  ALBUMIN 2.0* 2.0*   No results for input(s): LIPASE, AMYLASE in the last 72 hours. Cardiac Enzymes No results for input(s): CKTOTAL, CKMB, CKMBINDEX, TROPONINI in the last 72 hours.  BNP: BNP (last 3 results)  Recent Labs  09/26/15 1820 05/09/16 2343 06/01/16 1200  BNP 1393.4* 377.6* 1016.7*    ProBNP (last 3 results) No results for input(s): PROBNP in the last 8760 hours.   D-Dimer No results for input(s): DDIMER in the last 72 hours. Hemoglobin A1C No results for input(s): HGBA1C in the last 72 hours. Fasting Lipid Panel No results for input(s): CHOL,  HDL, LDLCALC, TRIG, CHOLHDL, LDLDIRECT in the last 72 hours. Thyroid Function Tests No results for input(s): TSH, T4TOTAL, T3FREE, THYROIDAB in the last 72 hours.  Invalid input(s): FREET3  Other results:     Imaging/Studies:  No results found.  Latest Echo  Latest Cath   Medications:     Scheduled Medications: . ferrous sulfate  325 mg Oral BID WC  . insulin aspart  0-15 Units Subcutaneous TID WC  . insulin glargine  15 Units Subcutaneous QHS  . pantoprazole  40 mg Oral Daily  . piperacillin-tazobactam (ZOSYN)  IV  2.25 g Intravenous Q8H  . sodium chloride flush  3 mL Intravenous Q12H  . vancomycin  750 mg  Intravenous Q24H    Infusions: . heparin 600 Units/hr (06/13/16 1800)    PRN Medications: bisacodyl, HYDROcodone-acetaminophen, magnesium citrate, morphine injection, morphine injection, ondansetron **OR** ondansetron (ZOFRAN) IV, senna-docusate, traZODone   Assessment / Plan   1. Acute on chronic cholecystitis - s/p ERCP  - WBCs trending down. Liver enzymes trending down.  - Symptomatically improved. Tolerating full liquid diet 2. Chronic systolic CHF - ICM - Echo 05/11/16 with LVEF 10-15%, mild AI, mild MR, normal RV.  - Volume status trending up with ARF on CKD.  Antihypertensives and diuretics held with same. - Creatinine continues to worsen. Per Dr Joelyn Oms, to start dialysis today.  3. Chronic Afib with CHADS2VASC score of 5. S/p AV node ablation V-paced  4. CAD s/p NSTEMI in 04/20/16 - No CP.  5. AKI on CKD stage IV s/p AVF in RUE  - Creatinine now > 6.  To start dialysis today. May need permanent dialysis moving forward.   - Pt has good AVF in R arm.  6. H/o rectal cancer 2008: Has diverting colostomy. He has follow up with Dr Benay Spice. - Recent CT (05/02/16) with no evidence of recurrence. 7. HLD.  Satira Mccallum Tillery PA-C 06/15/2016, 8:27 AM    Advanced Heart Failure Team Pager [phone] (M-F; 7a - 4p)  Please contact [facility] for night-coverage after hours (4p -7a ) and weekends on [email or web address]  Patient seen with PA, agree with the above note.  Tolerating diet, no significant abdominal pain. WBCs coming down.   Started HD today, tolerating so far.    Loralie Champagne 06/15/2016

## 2016-06-15 NOTE — Progress Notes (Signed)
Admit: 06/09/2016 LOS: 5  40M with AoCKD4, cSHF 2/2 ICM, cholecystitis s/p ERCP, chronic AFib,  Subjective:  GFR worsened again in past 24h, UOP < 500 Na 124  07/10 0701 - 07/11 0700 In: [id number] [P.O.:1040; I.V.:156; IV Piggyback:250] Out: 495 [Urine:495]  Filed Weights   06/13/16 0429 06/14/16 0644 06/15/16 0420  Weight: 83.462 kg (184 lb) 86.365 kg (190 lb 6.4 oz) 87.091 kg (192 lb)    Scheduled Meds: . ferrous sulfate  325 mg Oral BID WC  . insulin aspart  0-15 Units Subcutaneous TID WC  . insulin glargine  15 Units Subcutaneous QHS  . pantoprazole  40 mg Oral Daily  . piperacillin-tazobactam (ZOSYN)  IV  2.25 g Intravenous Q8H  . sodium chloride flush  3 mL Intravenous Q12H  . vancomycin  750 mg Intravenous Q24H   Continuous Infusions: . heparin 600 Units/hr (06/13/16 1800)   PRN Meds:.bisacodyl, HYDROcodone-acetaminophen, magnesium citrate, morphine injection, morphine injection, ondansetron **OR** ondansetron (ZOFRAN) IV, senna-docusate, traZODone  Current Labs: reviewed    Physical Exam:  Blood pressure 145/67, pulse 76, temperature 97.2 F (36.2 C), temperature source Oral, resp. rate 18, height 5\' 10"  (1.778 m), weight 87.091 kg (192 lb), SpO2 98 %. Gen alert, no distress, up in chair, calm No rash, cyanosis or gangrene Sclera anicteric, throat clear  No jvd or bruits, flat neck veins Chest clear bilat RRR 2/6 sem no RG Abd soft ntnd no mass or ascites +bs LLQ colostomy GU normal male MS no joint effusions or deformity Ext no LE or UE edema / no wounds or ulcers Neuro is alert, Ox 3 , nf R arm AVF +bruit  A 1. AoCKD4 BL SCr 2.0-3.0 likely 2/2 acute illness / ATN; didn't improve with fluid challenge 2. sCHF 2/2 ICM 3. Cholecystitis s/p ERCP 7/7 4. Presence of mature AVF 5. Hx/o rectal cancer 6. Chronic AFib now on hep gtt, NOAC at admission 7. Hyponatremia: likely hypervolemic  P 1. HD#1 today, 2h, 2L UF, 2K bath, Qb 200; no heparin; Na 135   Pearson Grippe MD 06/15/2016, 8:07 AM   Recent Labs Lab 06/14/16 0257 06/14/16 0958 06/15/16 0210  NA 124* 126* 124*  K 4.9 4.9 5.3*  CL 91* 92* 90*  CO2 16* 17* 16*  GLUCOSE 268* 213* 185*  BUN 100* 106* 120*  CREATININE 5.72* 5.82* 6.07*  CALCIUM 7.4* 7.8* 8.5*    Recent Labs Lab 06/14/16 0257 06/14/16 0958 06/15/16 0210  WBC 20.4* 17.5* 15.0*  NEUTROABS  --  16.0*  --   HGB 10.9* 10.8* 10.9*  HCT 31.1* 31.0* 31.7*  MCV 91.5 91.2 90.3  PLT 82* 75* 75*

## 2016-06-15 NOTE — Progress Notes (Signed)
Pt complained of mouth discomfot ,the patient with whitish trush on gums.

## 2016-06-15 NOTE — Care Management Important Message (Signed)
Important Message  Patient Details  Name: [name] MRN: [id number] Date of Birth: [birth year]   Medicare Important Message Given:  Yes    [name] May 06/15/2016, 12:22 PM

## 2016-06-16 ENCOUNTER — Other Ambulatory Visit: Payer: Medicare Other

## 2016-06-16 LAB — CBC
HEMATOCRIT: 33 % — AB (ref 39.0–52.0)
HEMOGLOBIN: 11.4 g/dL — AB (ref 13.0–17.0)
MCH: 31.5 pg (ref 26.0–34.0)
MCHC: 34.5 g/dL (ref 30.0–36.0)
MCV: 91.2 fL (ref 78.0–100.0)
Platelets: 85 10*3/uL — ABNORMAL LOW (ref 150–400)
RBC: 3.62 MIL/uL — AB (ref 4.22–5.81)
RDW: 13.2 % (ref 11.5–15.5)
WBC: 17 10*3/uL — ABNORMAL HIGH (ref 4.0–10.5)

## 2016-06-16 LAB — GLUCOSE, CAPILLARY
GLUCOSE-CAPILLARY: 151 mg/dL — AB (ref 65–99)
GLUCOSE-CAPILLARY: 128 mg/dL — AB (ref 65–99)
Glucose-Capillary: 136 mg/dL — ABNORMAL HIGH (ref 65–99)
Glucose-Capillary: 156 mg/dL — ABNORMAL HIGH (ref 65–99)

## 2016-06-16 LAB — CULTURE, BLOOD (ROUTINE X 2)
Culture: NO GROWTH
Culture: NO GROWTH

## 2016-06-16 LAB — COMPREHENSIVE METABOLIC PANEL
ALBUMIN: 2.1 g/dL — AB (ref 3.5–5.0)
ALT: 125 U/L — ABNORMAL HIGH (ref 17–63)
ANION GAP: 17 — AB (ref 5–15)
AST: 134 U/L — ABNORMAL HIGH (ref 15–41)
Alkaline Phosphatase: 156 U/L — ABNORMAL HIGH (ref 38–126)
BILIRUBIN TOTAL: 3.5 mg/dL — AB (ref 0.3–1.2)
BUN: 100 mg/dL — ABNORMAL HIGH (ref 6–20)
CALCIUM: 9.1 mg/dL (ref 8.9–10.3)
CO2: 18 mmol/L — ABNORMAL LOW (ref 22–32)
Chloride: 92 mmol/L — ABNORMAL LOW (ref 101–111)
Creatinine, Ser: 5.65 mg/dL — ABNORMAL HIGH (ref 0.61–1.24)
GFR calc non Af Amer: 10 mL/min — ABNORMAL LOW (ref >60)
GFR, EST AFRICAN AMERICAN: 11 mL/min — AB (ref >60)
GLUCOSE: 165 mg/dL — AB (ref 65–99)
POTASSIUM: 5 mmol/L (ref 3.5–5.1)
SODIUM: 127 mmol/L — AB (ref 135–145)
TOTAL PROTEIN: 7.5 g/dL (ref 6.5–8.1)

## 2016-06-16 LAB — HEPATITIS B CORE ANTIBODY, TOTAL: Hep B Core Total Ab: NEGATIVE

## 2016-06-16 LAB — HEPARIN LEVEL (UNFRACTIONATED)
HEPARIN UNFRACTIONATED: 0.18 [IU]/mL — AB (ref 0.30–0.70)
Heparin Unfractionated: 0.21 IU/mL — ABNORMAL LOW (ref 0.30–0.70)

## 2016-06-16 LAB — HEPATITIS B SURFACE ANTIBODY,QUALITATIVE: HEP B S AB: NONREACTIVE

## 2016-06-16 MED ORDER — METOPROLOL SUCCINATE ER 25 MG PO TB24
12.5000 mg | ORAL_TABLET | Freq: Two times a day (BID) | ORAL | Status: DC
  Administered 2016-06-16 – 2016-06-21 (×10): 12.5 mg via ORAL
  Filled 2016-06-16 (×11): qty 1

## 2016-06-16 NOTE — Progress Notes (Signed)
Admit: 06/09/2016 LOS: 6  16M with AoCKD4, cSHF 2/2 ICM, cholecystitis s/p ERCP, chronic AFib,  Subjective:  HD #1 yesterday, 1.5L UF, used AVF w/o problem, post weight 86.1kg No new events In chair this AM  07/11 0701 - 07/12 0700 In: 839.2 [P.O.:660; I.V.:179.2] Out: 1925 [Urine:425]  Filed Weights   06/15/16 1450 06/15/16 1716 06/16/16 0622  Weight: 93.5 kg (206 lb 2.1 oz) 86.1 kg (189 lb 13.1 oz) 86.093 kg (189 lb 12.8 oz)    Scheduled Meds: . amoxicillin-clavulanate  1 tablet Oral QHS  . ferrous sulfate  325 mg Oral BID WC  . insulin aspart  0-15 Units Subcutaneous TID WC  . insulin glargine  15 Units Subcutaneous QHS  . metoprolol succinate  12.5 mg Oral BID  . pantoprazole  40 mg Oral Daily  . sodium chloride flush  3 mL Intravenous Q12H   Continuous Infusions: . heparin 750 Units/hr (06/16/16 0352)   PRN Meds:.bisacodyl, HYDROcodone-acetaminophen, magnesium citrate, morphine injection, morphine injection, ondansetron **OR** ondansetron (ZOFRAN) IV, senna-docusate, traZODone  Current Labs: reviewed    Physical Exam:  Blood pressure 107/60, pulse 74, temperature 97.3 F (36.3 C), temperature source Oral, resp. rate 18, height 5\' 10"  (1.778 m), weight 86.093 kg (189 lb 12.8 oz), SpO2 100 %. Gen alert, no distress, up in chair, calm No rash, cyanosis or gangrene Sclera anicteric, throat clear  No jvd or bruits, flat neck veins Chest clear bilat RRR 2/6 sem no RG Abd soft ntnd no mass or ascites +bs LLQ colostomy GU normal male MS no joint effusions or deformity Ext no LE or UE edema / no wounds or ulcers Neuro is alert, Ox 3 , nf R arm AVF +bruit  A 1. AoCKD4 BL SCr 2.0-3.0 likely 2/2 acute illness / ATN; didn't improve with fluid challenge 2. sCHF 2/2 ICM 3. Cholecystitis s/p ERCP 7/7 4. Presence of mature AVF -- successfully cannulating 5. Hx/o rectal cancer 6. Chronic AFib now on hep gtt, NOAC at admission 7. Hyponatremia: likely hypervolemic;  improving with HD  P 1. HD#2 tomorrow, 3h, 2L UF, 2K bath, Qb 200; no heparin; Na 137 2. CLIP for [place] -- might be as an acute at this time   Pearson Grippe MD 06/16/2016, 10:24 AM   Recent Labs Lab 06/14/16 0958 06/15/16 0210 06/16/16 0220  NA 126* 124* 127*  K 4.9 5.3* 5.0  CL 92* 90* 92*  CO2 17* 16* 18*  GLUCOSE 213* 185* 165*  BUN 106* 120* 100*  CREATININE 5.82* 6.07* 5.65*  CALCIUM 7.8* 8.5* 9.1    Recent Labs Lab 06/14/16 0958 06/15/16 0210 06/16/16 0220  WBC 17.5* 15.0* 17.0*  NEUTROABS 16.0*  --   --   HGB 10.8* 10.9* 11.4*  HCT 31.0* 31.7* 33.0*  MCV 91.2 90.3 91.2  PLT 75* 75* 85*

## 2016-06-16 NOTE — Progress Notes (Signed)
Advanced Heart Failure Rounding Note  Referring Physician: Dr Lily Kocher PCP: Dr. Gerarda Fraction Oncologist: Dr Benay Spice.  CHF: Bensimhon  Reason for Consultation: Cardiac consideration/clearance for GI surgery.   Subjective:    60 y/o with brittle systolic HF. EF 20% and CKD IV. Admitted 06/09/16 with recurrent biliary colic found to have biliary pancreatitis. Course c/b worsening renal failure  Underwent ERCP with stent placement on 7/7 with symptom improvement.   WBC 23.6 -> 20.4 -> 15.0 -> 17.0. Afebrile.  Started on dialysis yesterday. Some fatigue today. Abdominal pain continues to improve.  No SOB or CP.  Hopeful he will get to still vacation at the beach by 07/03/16.  Weight stable despite dialysis yesterday. Creatinine 3.98 -> 4.76 -> 5.47 -> 5.72 -> 6.07 -> dialysis -> 5.65  Objective:   Weight Range: 189 lb 12.8 oz (86.093 kg) Body mass index is 27.23 kg/(m^2).   Vital Signs:   Temp:  [97 F (36.1 C)-98.2 F (36.8 C)] 97.3 F (36.3 C) (07/11 1716) Pulse Rate:  [70-77] 74 (07/12 0622) Resp:  [13-20] 18 (07/12 0622) BP: (93-124)/(58-76) 107/60 mmHg (07/12 0622) SpO2:  [98 %-100 %] 100 % (07/12 0622) Weight:  [189 lb 12.8 oz (86.093 kg)-206 lb 2.1 oz (93.5 kg)] 189 lb 12.8 oz (86.093 kg) (07/12 0622) Last BM Date: 06/14/16  Weight change: Filed Weights   06/15/16 1450 06/15/16 1716 06/16/16 0622  Weight: 206 lb 2.1 oz (93.5 kg) 189 lb 13.1 oz (86.1 kg) 189 lb 12.8 oz (86.093 kg)    Intake/Output:   Intake/Output Summary (Last 24 hours) at 06/16/16 0807 Last data filed at 06/16/16 0352  Gross per 24 hour  Intake  839.2 ml  Output   1825 ml  Net -985.8 ml     Physical Exam: General: NAD, HEENT: Scleral icterus, mild jaundice Neck: JVP 7-8 cm, no thyromegaly or nodule nodule.  No carotid bruit.  Lungs: Clear, normal effort.  CV: Nondisplaced PMI. Regular S1S2, 2/6 SEM RUSB.  Abdomen: Soft, NT, ND, no HSM. No bruits or masses. +BS .  Skin: Intact  without lesions or rashes.  Neurologic: Alert and oriented x 3.  Psych: Normal affect. Extremities: No clubbing or cyanosis.  Trace ankle edema.Normal pedal pulses.    Telemetry: Reviewed personally, Vpaced rhythm 70s  Labs: CBC  Recent Labs  06/14/16 0958 06/15/16 0210 06/16/16 0220  WBC 17.5* 15.0* 17.0*  NEUTROABS 16.0*  --   --   HGB 10.8* 10.9* 11.4*  HCT 31.0* 31.7* 33.0*  MCV 91.2 90.3 91.2  PLT 75* 75* 85*   Basic Metabolic Panel  Recent Labs  06/15/16 0210 06/16/16 0220  NA 124* 127*  K 5.3* 5.0  CL 90* 92*  CO2 16* 18*  GLUCOSE 185* 165*  BUN 120* 100*  CREATININE 6.07* 5.65*  CALCIUM 8.5* 9.1   Liver Function Tests  Recent Labs  06/15/16 0210 06/16/16 0220  AST 200* 134*  ALT 150* 125*  ALKPHOS 160* 156*  BILITOT 4.1* 3.5*  PROT 7.2 7.5  ALBUMIN 2.0* 2.1*   No results for input(s): LIPASE, AMYLASE in the last 72 hours. Cardiac Enzymes No results for input(s): CKTOTAL, CKMB, CKMBINDEX, TROPONINI in the last 72 hours.  BNP: BNP (last 3 results)  Recent Labs  09/26/15 1820 05/09/16 2343 06/01/16 1200  BNP 1393.4* 377.6* 1016.7*    ProBNP (last 3 results) No results for input(s): PROBNP in the last 8760 hours.   D-Dimer No results for input(s): DDIMER in the last  72 hours. Hemoglobin A1C No results for input(s): HGBA1C in the last 72 hours. Fasting Lipid Panel No results for input(s): CHOL, HDL, LDLCALC, TRIG, CHOLHDL, LDLDIRECT in the last 72 hours. Thyroid Function Tests No results for input(s): TSH, T4TOTAL, T3FREE, THYROIDAB in the last 72 hours.  Invalid input(s): FREET3  Other results:     Imaging/Studies:  No results found.  Latest Echo  Latest Cath   Medications:     Scheduled Medications: . amoxicillin-clavulanate  1 tablet Oral QHS  . ferrous sulfate  325 mg Oral BID WC  . insulin aspart  0-15 Units Subcutaneous TID WC  . insulin glargine  15 Units Subcutaneous QHS  . pantoprazole  40 mg Oral  Daily  . sodium chloride flush  3 mL Intravenous Q12H    Infusions: . heparin 750 Units/hr (06/16/16 0352)    PRN Medications: bisacodyl, HYDROcodone-acetaminophen, magnesium citrate, morphine injection, morphine injection, ondansetron **OR** ondansetron (ZOFRAN) IV, senna-docusate, traZODone   Assessment / Plan   1. Acute on chronic cholecystitis - s/p ERCP  - WBCs stable. Liver enzymes continue to trend down.  - Symptomatically improved. Tolerating full liquid diet 2. Chronic systolic CHF - ICM - Echo 05/11/16 with LVEF 10-15%, mild AI, mild MR, normal RV.  - Volume status slightly improved with dialysis, but weight stable.   - Further diuretics per Renal. Pt producing urine on own, so po diuretics may have some role in treatment going forward.  - Now on dialysis.  3. Chronic Afib with CHADS2VASC score of 5. S/p AV node ablation V-paced  4. CAD s/p NSTEMI in 04/20/16 - No CP.  5. AKI on CKD stage IV s/p AVF in RUE  - Dialysis started yesterday with creatinine > 6. Cr 5.6. To be observed off dialysis today with further decisions based on urine production, creatinine, and symptoms.  If not on admission, pt will likely need permanent dialysis in next several months.   6. H/o rectal cancer 2008: Has diverting colostomy. He has follow up with Dr Benay Spice. - Recent CT (05/02/16) with no evidence of recurrence. 7. HLD.  Satira Mccallum Tillery PA-C 06/16/2016, 8:07 AM    Advanced Heart Failure Team Pager [phone] (M-F; 7a - 4p)  Please contact [facility] for night-coverage after hours (4p -7a ) and weekends on [email or web address]  Patient seen with PA, agree with the above note.  He had HD yesterday with fall in weight.  No JVD on exam today.  No complaints this morning.  Will follow along for renal recommendations in terms of ongoing HD.    I think that we can carefully restart Toprol XL 12.5 mg bid (home dose of beta blocker).   Will have him ambulate in halls today.   Loralie Champagne 06/16/2016 9:03 AM

## 2016-06-16 NOTE — Progress Notes (Signed)
ANTICOAGULATION CONSULT NOTE - Follow Up Consult  Pharmacy Consult for heparin Indication: atrial fibrillation  No Known Allergies  Patient Measurements: Height: 5\' 10"  (177.8 cm) Weight: 189 lb 13.1 oz (86.1 kg) (standing weight) IBW/kg (Calculated) : 73 Heparin Dosing Weight: 82 kg  Vital Signs: Temp: 97.3 F (36.3 C) (07/11 1716) Temp Source: Oral (07/11 1716) BP: 105/58 mmHg (07/12 0119) Pulse Rate: 70 (07/11 1716)  Labs:  Recent Labs  06/14/16 0257 06/14/16 0958 06/15/16 0210 06/16/16 0220  HGB 10.9* 10.8* 10.9* 11.4*  HCT 31.1* 31.0* 31.7* 33.0*  PLT 82* 75* 75* 85*  HEPARINUNFRC 0.52  --  0.47 0.18*  CREATININE 5.72* 5.82* 6.07* 5.65*    Estimated Creatinine Clearance: 14.2 mL/min (by C-G formula based on Cr of 5.65).   Medications:  Infusions:  . heparin 600 Units/hr (06/15/16 2030)    Assessment: 59yom admitted with acute cholecystis s/p ERCP and stent placed.PTA apixaban held on admit for possible surgery and remains on hold until patient more stable. Last apixaban dose 7/4 PTA. Pt on heparin bridge for stroke prevention until apixaban restarted. Will not bolus and will keep HL at low end of goal to decrease bleeding risk.   Heparin level now down to subtherapeutic (0.18) on 600 units/h. Hgb and plt stable. No issues with line or bleeding reported per RN.  Goal of Therapy:  Heparin level 0.3-0.5 units/ml Monitor platelets by anticoagulation protocol: Yes   Plan:  - Increase heparin gtt to 750 units/hr - Will f/u 8 hr heparin level  Sherlon Handing, PharmD, BCPS Clinical pharmacist, pager [phone] 06/16/2016 3:36 AM

## 2016-06-16 NOTE — Progress Notes (Signed)
ANTICOAGULATION CONSULT NOTE - Follow Up Consult  Pharmacy Consult for heparin Indication: atrial fibrillation  No Known Allergies  Patient Measurements: Height: 5\' 10"  (177.8 cm) Weight: 189 lb 12.8 oz (86.093 kg) IBW/kg (Calculated) : 73 Heparin Dosing Weight: 82 kg  Vital Signs: BP: 104/63 mmHg (07/12 1151) Pulse Rate: 76 (07/12 1151)  Labs:  Recent Labs  06/14/16 0958 06/15/16 0210 06/16/16 0220 06/16/16 1544  HGB 10.8* 10.9* 11.4*  --   HCT 31.0* 31.7* 33.0*  --   PLT 75* 75* 85*  --   HEPARINUNFRC  --  0.47 0.18* 0.21*  CREATININE 5.82* 6.07* 5.65*  --     Estimated Creatinine Clearance: 14.2 mL/min (by C-G formula based on Cr of 5.65).   Medications:  Infusions:  . heparin 750 Units/hr (06/16/16 0352)    Assessment: 59yom admitted with acute cholecystis s/p ERCP and stent placed.PTA apixaban held on admit for possible surgery and remains on hold until patient more stable. Last apixaban dose 7/4 PTA. Pt on heparin bridge for stroke prevention until apixaban restarted. Will not bolus and will keep HL at low end of goal to decrease bleeding risk.   Last HL remains low at 0.21 after rate increase. No issues documented or reported by RN.  Goal of Therapy:  Heparin level 0.3-0.5 units/ml Monitor platelets by anticoagulation protocol: Yes   Plan:  Increase heparin gtt to 900 units/hr Check 8 hr HL Monitor daily HL, CBC, s/s of bleed  Elenor Quinones, PharmD, [facility] Clinical Pharmacist Pager [phone] 06/16/2016 4:43 PM

## 2016-06-16 NOTE — Progress Notes (Signed)
PROGRESS NOTE  [name]  POE:[id number] DOB: 12/07/1955 DOA: 06/09/2016 PCP: Glo Herring., MD Outpatient Specialists:  Subjective: No new complaints. Awake and alert. Denied fever, chills, abdominal pain. Had first HD yesterday for 2 hours as per collateral information.  Brief Narrative:  RUQ abdominal pain, has history of severe ischemic cardiomyopathy and rectal cancer status post partial colectomy and colostomy.  Assessment & Plan:   Active Problems:   History of rectal cancer   Ischemic cardiomyopathy-EF 35% 04/24/15 echo   CAD with LCX disease and stents to LAD   Chronic systolic heart failure (HCC)   Paroxysmal atrial fibrillation ([place])   DM type 2, uncontrolled, with renal complications (HCC)   Gallbladder polyp   GERD (gastroesophageal reflux disease)   Orthostatic hypotension   OSA on CPAP   Hyperlipidemia   Transaminitis   Anemia of chronic disease   Right heart failure (HCC)   Cholecystitis   Biliary disease with obstruction   AKI (acute kidney injury) ([place])   Acute on chronic systolic CHF (congestive heart failure) (HCC)    RUQ pain likely acute on chronic cholecystitis.  -Patient readmitted with same issues 6/5, not a surgical candidate at the time due to myriad of cardiac issues .  -Abd [place] today shows Gallstones and mild gallbladder wall thickening possibly reflecting cholecystitis. No pericholecystic fluid or positive sonographic Murphy's sign. Mild dilation of the common bile duct without evidence of commonduct stones.  -Total bilirubin 3.2. AST 354 ALT 177 lipase 78.  -HIDA scan done and showed indeterminate results secondary to lack of gallbladder filling. -ERCP done on 06/11/16 by Dr. Amedeo Plenty, with sphincterotomy and placement of CBD stent. -Transaminases and total bilirubin improving after ERCP. -Is on full liquid diet, tolerating that very well for 2 days, all advance to renal diet.  Likely early sepsis -WBC is down to 17.  -Patient is now  on Augmentin.  Acute kidney injury on CKD stage IV baseline creatinine 2.6. - Had first HD yesterday. Discussed with Nephrology today, for repeat HD in am. -Was on diuretics at home, creatinine increase inconsistently, -Has a R AVF if needed for HD. -He is getting acidotic and the creatinine is worsening, nephrology consulted.    Atrial Fibrillation CHA2DS2-VASc score 3, on anticoagulation with Eliquis -Rate is controlled, home medications continued -Eliquis on hold. Started on heparin drip. - Monitor closely if Eliquis will be continued. Weigh option of coumadin as well.  Chronic systolic heart failure, last echocardiogram 6/6 with Severely dilated cavity, mild LVH, EF 10-15 % Dry weight 82 kg, current weight 82.2 . Appears compensated.  - Daily weights, strict I/O  CAD, Tn 0.01 EKG Shows paced rhythm , last cardiac catheterization with stent in 2015, patient is cardiac pain free at this time. Continue meds   History of Hypotension stable for now BP 104/55 mmHg  Pulse 75  IVF on hold in the setting of severe CHF /Afib with risk of volume overload  Continue to monitor   Type II Diabetes - Optimize.  OSA on CPAP/COPD, not on acute exacerbation, stable  Continue CPAP and O2  GERD, no acute symptoms: Continue PPI  History of Rectal Cancer, s/p chemo/ XRT No acute changes.   Elevated lipase - Will repeat Lipase in am. No abdominal pain.   DVT prophylaxis: SCDs now on heparin drip. Code Status: Full Code Family Communication:  Disposition Plan:  Diet:    Consultants:   Nephrology  GI   Antimicrobials:   Augmentin   Objective: Filed  Vitals:   06/15/16 1700 06/15/16 1716 06/16/16 0119 06/16/16 0622  BP: 102/59 99/64 105/58 107/60  Pulse: 75 70  74  Temp:  97.3 F (36.3 C)    TempSrc:  Oral    Resp: '18 13 16 18  '$ Height:      Weight:  86.1 kg (189 lb 13.1 oz)  86.093 kg (189 lb 12.8 oz)  SpO2:  100% 100% 100%    Intake/Output Summary  (Last 24 hours) at 06/16/16 1014 Last data filed at 06/16/16 0911  Gross per 24 hour  Intake  599.2 ml  Output   1975 ml  Net -1375.8 ml   Filed Weights   06/15/16 1450 06/15/16 1716 06/16/16 0622  Weight: 93.5 kg (206 lb 2.1 oz) 86.1 kg (189 lb 13.1 oz) 86.093 kg (189 lb 12.8 oz)    Examination: General exam: Appears calm and comfortable  Respiratory system: Clear to auscultation. Respiratory effort normal. Cardiovascular system: S1 & S2 heard, RRR. No JVD, murmurs, rubs, gallops or clicks. No pedal edema. Gastrointestinal system: Abdomen is nondistended, soft and nontender. No organomegaly or masses felt. Normal bowel sounds heard. Central nervous system: Alert and oriented. No focal neurological deficits. Extremities: Symmetric 5 x 5 power. Skin: No rashes, lesions or ulcers Psychiatry: Judgement and insight appear normal. Mood & affect appropriate.   Data Reviewed: I have personally reviewed following labs and imaging studies  CBC:  Recent Labs Lab 06/13/16 0736 06/14/16 0257 06/14/16 0958 06/15/16 0210 06/16/16 0220  WBC 23.6* 20.4* 17.5* 15.0* 17.0*  NEUTROABS  --   --  16.0*  --   --   HGB 12.1* 10.9* 10.8* 10.9* 11.4*  HCT 35.3* 31.1* 31.0* 31.7* 33.0*  MCV 92.7 91.5 91.2 90.3 91.2  PLT 88* 82* 75* 75* 85*   Basic Metabolic Panel:  Recent Labs Lab 06/10/16 0246  06/13/16 0736 06/14/16 0257 06/14/16 0958 06/15/16 0210 06/16/16 0220  NA 134*  < > 125* 124* 126* 124* 127*  K 4.0  < > 4.9 4.9 4.9 5.3* 5.0  CL 94*  < > 89* 91* 92* 90* 92*  CO2 31  < > 17* 16* 17* 16* 18*  GLUCOSE 188*  < > 341* 268* 213* 185* 165*  BUN 41*  < > 93* 100* 106* 120* 100*  CREATININE 2.88*  < > 5.47* 5.72* 5.82* 6.07* 5.65*  CALCIUM 10.2  < > 8.0* 7.4* 7.8* 8.5* 9.1  MG 2.2  --   --   --   --   --   --   < > = values in this interval not displayed. GFR: Estimated Creatinine Clearance: 14.2 mL/min (by C-G formula based on Cr of 5.65). Liver Function Tests:  Recent  Labs Lab 06/13/16 0736 06/14/16 0257 06/14/16 0958 06/15/16 0210 06/16/16 0220  AST 225* 268* 266* 200* 134*  ALT 159* 171* 171* 150* 125*  ALKPHOS 188* 161* 168* 160* 156*  BILITOT 5.6* 4.2* 4.1* 4.1* 3.5*  PROT 7.3 7.4 7.3 7.2 7.5  ALBUMIN 2.1* 2.0* 2.0* 2.0* 2.1*    Recent Labs Lab 06/11/16 0732  LIPASE 396*   No results for input(s): AMMONIA in the last 168 hours. Coagulation Profile:  Recent Labs Lab 06/09/16 1041 06/10/16 0246  INR 1.27 1.60*   Cardiac Enzymes:  Recent Labs Lab 06/09/16 1041 06/09/16 1757 06/09/16 2153  TROPONINI 0.04* 0.04* 0.05*   BNP (last 3 results) No results for input(s): PROBNP in the last 8760 hours. HbA1C: No results for input(s): HGBA1C in  the last 72 hours. CBG:  Recent Labs Lab 06/15/16 0746 06/15/16 1142 06/15/16 1805 06/15/16 2049 06/16/16 0619  GLUCAP 172* 176* 129* 183* 151*   Lipid Profile: No results for input(s): CHOL, HDL, LDLCALC, TRIG, CHOLHDL, LDLDIRECT in the last 72 hours. Thyroid Function Tests: No results for input(s): TSH, T4TOTAL, FREET4, T3FREE, THYROIDAB in the last 72 hours. Anemia Panel: No results for input(s): VITAMINB12, FOLATE, FERRITIN, TIBC, IRON, RETICCTPCT in the last 72 hours. Urine analysis:    Component Value Date/Time   COLORURINE YELLOW 06/09/2016 1023   APPEARANCEUR CLEAR 06/09/2016 1023   LABSPEC 1.017 06/09/2016 1023   PHURINE 5.5 06/09/2016 1023   GLUCOSEU >1000* 06/09/2016 1023   HGBUR NEGATIVE 06/09/2016 1023   BILIRUBINUR NEGATIVE 06/09/2016 1023   KETONESUR NEGATIVE 06/09/2016 1023   PROTEINUR NEGATIVE 06/09/2016 1023   UROBILINOGEN 1.0 08/23/2015 0322   NITRITE NEGATIVE 06/09/2016 1023   LEUKOCYTESUR NEGATIVE 06/09/2016 1023   Sepsis Labs: '@LABRCNTIP'$ (procalcitonin:4,lacticidven:4)  ) Recent Results (from the past 240 hour(s))  Culture, blood (Routine X 2) w Reflex to ID Panel     Status: None (Preliminary result)   Collection Time: 06/11/16  7:25 AM  Result  Value Ref Range Status   Specimen Description BLOOD LEFT HAND  Final   Special Requests BOTTLES DRAWN AEROBIC AND ANAEROBIC  5CC  Final   Culture NO GROWTH 4 DAYS  Final   Report Status PENDING  Incomplete  Culture, blood (Routine X 2) w Reflex to ID Panel     Status: None (Preliminary result)   Collection Time: 06/11/16  7:29 AM  Result Value Ref Range Status   Specimen Description BLOOD LEFT HAND  Final   Special Requests BOTTLES DRAWN AEROBIC ONLY  5CC  Final   Culture NO GROWTH 4 DAYS  Final   Report Status PENDING  Incomplete     Invalid input(s): PROCALCITONIN, LACTICACIDVEN   Radiology Studies: No results found.      Scheduled Meds: . amoxicillin-clavulanate  1 tablet Oral QHS  . ferrous sulfate  325 mg Oral BID WC  . insulin aspart  0-15 Units Subcutaneous TID WC  . insulin glargine  15 Units Subcutaneous QHS  . metoprolol succinate  12.5 mg Oral BID  . pantoprazole  40 mg Oral Daily  . sodium chloride flush  3 mL Intravenous Q12H   Continuous Infusions: . heparin 750 Units/hr (06/16/16 0352)     LOS: 6 days    Time spent: 35 minutes    Bonnell Public, MD [facility] Pager [phone]  If 7PM-7AM, please contact night-coverage [email or web address] Password [email or web address] 06/16/2016, 10:14 AM

## 2016-06-16 NOTE — Progress Notes (Signed)
06/16/2016 1:58 PM Hemodialysis Outpatient Note; I have initiated the "CLIP" process for this patient. Please note that since we are looking for a schedule at the [facility] we need to be certain [name] has had a PPD placed and read within the last 30 days or can have a CXR which specifically states "no active TB", this is very important to the acceptance process. Thank you. Gordy Savers

## 2016-06-16 NOTE — Progress Notes (Signed)
Staff unable to get pt's oral temp this AM. Pt without rectum, so rectal temp unachievable. Will continue to monitor.

## 2016-06-17 ENCOUNTER — Ambulatory Visit: Payer: Self-pay | Admitting: *Deleted

## 2016-06-17 LAB — COMPREHENSIVE METABOLIC PANEL
ALBUMIN: 2.1 g/dL — AB (ref 3.5–5.0)
ALT: 117 U/L — ABNORMAL HIGH (ref 17–63)
AST: 113 U/L — AB (ref 15–41)
Alkaline Phosphatase: 151 U/L — ABNORMAL HIGH (ref 38–126)
Anion gap: 16 — ABNORMAL HIGH (ref 5–15)
BUN: 111 mg/dL — AB (ref 6–20)
CHLORIDE: 93 mmol/L — AB (ref 101–111)
CO2: 18 mmol/L — AB (ref 22–32)
Calcium: 9.2 mg/dL (ref 8.9–10.3)
Creatinine, Ser: 6.42 mg/dL — ABNORMAL HIGH (ref 0.61–1.24)
GFR calc Af Amer: 10 mL/min — ABNORMAL LOW (ref >60)
GFR calc non Af Amer: 8 mL/min — ABNORMAL LOW (ref >60)
GLUCOSE: 142 mg/dL — AB (ref 65–99)
POTASSIUM: 5.6 mmol/L — AB (ref 3.5–5.1)
SODIUM: 127 mmol/L — AB (ref 135–145)
Total Bilirubin: 3.2 mg/dL — ABNORMAL HIGH (ref 0.3–1.2)
Total Protein: 7.5 g/dL (ref 6.5–8.1)

## 2016-06-17 LAB — CBC
HCT: 31.4 % — ABNORMAL LOW (ref 39.0–52.0)
HEMOGLOBIN: 11 g/dL — AB (ref 13.0–17.0)
MCH: 32 pg (ref 26.0–34.0)
MCHC: 35 g/dL (ref 30.0–36.0)
MCV: 91.3 fL (ref 78.0–100.0)
PLATELETS: 118 10*3/uL — AB (ref 150–400)
RBC: 3.44 MIL/uL — AB (ref 4.22–5.81)
RDW: 13.3 % (ref 11.5–15.5)
WBC: 20 10*3/uL — AB (ref 4.0–10.5)

## 2016-06-17 LAB — HEPARIN LEVEL (UNFRACTIONATED)
HEPARIN UNFRACTIONATED: 1.62 [IU]/mL — AB (ref 0.30–0.70)
HEPARIN UNFRACTIONATED: 0.27 [IU]/mL — AB (ref 0.30–0.70)
Heparin Unfractionated: 0.24 IU/mL — ABNORMAL LOW (ref 0.30–0.70)

## 2016-06-17 LAB — GLUCOSE, CAPILLARY
GLUCOSE-CAPILLARY: 122 mg/dL — AB (ref 65–99)
Glucose-Capillary: 81 mg/dL (ref 65–99)
Glucose-Capillary: 95 mg/dL (ref 65–99)
Glucose-Capillary: 107 mg/dL — ABNORMAL HIGH (ref 65–99)

## 2016-06-17 MED ORDER — LIDOCAINE HCL (PF) 1 % IJ SOLN
5.0000 mL | INTRAMUSCULAR | Status: DC | PRN
Start: 2016-06-17 — End: 2016-06-17

## 2016-06-17 MED ORDER — ALTEPLASE 2 MG IJ SOLR: 2.0000 mg | Freq: Once | INTRAMUSCULAR | Status: DC | PRN

## 2016-06-17 MED ORDER — PENTAFLUOROPROP-TETRAFLUOROETH EX AERO: 1.0000 "application " | INHALATION_SPRAY | CUTANEOUS | Status: DC | PRN

## 2016-06-17 MED ORDER — SODIUM CHLORIDE 0.9 % IV SOLN: 100.0000 mL | INTRAVENOUS | Status: DC | PRN

## 2016-06-17 MED ORDER — LIDOCAINE-PRILOCAINE 2.5-2.5 % EX CREA: 1.0000 "application " | TOPICAL_CREAM | CUTANEOUS | Status: DC | PRN

## 2016-06-17 MED ORDER — HEPARIN SODIUM (PORCINE) 1000 UNIT/ML DIALYSIS
1000.0000 [IU] | INTRAMUSCULAR | Status: DC | PRN
Start: 2016-06-17 — End: 2016-06-17

## 2016-06-17 NOTE — Progress Notes (Signed)
ANTICOAGULATION CONSULT NOTE - Follow Up Consult  Pharmacy Consult for heparin Indication: atrial fibrillation  No Known Allergies  Patient Measurements: Height: 5\' 10"  (177.8 cm) Weight: 189 lb 12.8 oz (86.093 kg) IBW/kg (Calculated) : 73 Heparin Dosing Weight: 82 kg  Vital Signs: BP: 114/65 mmHg (07/12 2020) Pulse Rate: 70 (07/12 2020)  Labs:  Recent Labs  06/14/16 0958  06/15/16 0210 06/16/16 0220 06/16/16 1544 06/17/16 0241  HGB 10.8*  --  10.9* 11.4*  --  11.0*  HCT 31.0*  --  31.7* 33.0*  --  31.4*  PLT 75*  --  75* 85*  --  118*  HEPARINUNFRC  --   < > 0.47 0.18* 0.21* 0.24*  CREATININE 5.82*  --  6.07* 5.65*  --   --   < > = values in this interval not displayed.  Estimated Creatinine Clearance: 14.2 mL/min (by C-G formula based on Cr of 5.65).   Medications:  Infusions:  . heparin 900 Units/hr (06/16/16 1649)    Assessment: 59yom admitted with acute cholecystis s/p ERCP and stent placed.PTA apixaban held on admit for possible surgery and remains on hold until patient more stable. Last apixaban dose 7/4 PTA. Pt on heparin bridge for stroke prevention until apixaban restarted. Will not bolus and will keep HL at low end of goal to decrease bleeding risk.   Heparin level remains subtherapeutic on 900 units/hr. CBC stable, plt up to 118. No issues with line per RN. Pt has skin tear on arm which is oozing but stable.  Goal of Therapy:  Heparin level 0.3-0.5 units/ml Monitor platelets by anticoagulation protocol: Yes   Plan:  Increase heparin gtt to 1050 units/hr Check 8 hr HL  Sherlon Handing, PharmD, BCPS Clinical pharmacist, pager [phone] 06/17/2016 3:20 AM

## 2016-06-17 NOTE — Progress Notes (Signed)
PROGRESS NOTE  Robert BEKELE  DXI:[id number] DOB: 1956/05/08 DOA: 06/09/2016 PCP: Glo Herring., MD Outpatient Specialists:  Subjective: No new complaints. Awake and alert. Denied fever, chills, abdominal pain. Feeling tired after HD again this AM, but otherwise feeling well, tolerating this diet.  Brief Narrative:  RUQ abdominal pain, has history of severe ischemic cardiomyopathy and rectal cancer status post partial colectomy and colostomy.  Assessment & Plan:   Active Problems:   History of rectal cancer   Ischemic cardiomyopathy-EF 35% 04/24/15 echo   CAD with LCX disease and stents to LAD   Chronic systolic heart failure (HCC)   Paroxysmal atrial fibrillation ([place])   DM type 2, uncontrolled, with renal complications (HCC)   Gallbladder polyp   GERD (gastroesophageal reflux disease)   Orthostatic hypotension   OSA on CPAP   Hyperlipidemia   Transaminitis   Anemia of chronic disease   Right heart failure (HCC)   Cholecystitis   Biliary disease with obstruction   AKI (acute kidney injury) ([place])   Acute on chronic systolic CHF (congestive heart failure) (HCC)    RUQ pain likely acute on chronic cholecystitis.  -Patient readmitted with same issues 6/5, not a surgical candidate at the time due to myriad of cardiac issues .  -Abd [place] today shows Gallstones and mild gallbladder wall thickening possibly reflecting cholecystitis. No pericholecystic fluid or positive sonographic Murphy's sign. Mild dilation of the common bile duct without evidence of commonduct stones.  -wbc climbed but liver enzymes improving -HIDA scan done and showed indeterminate results secondary to lack of gallbladder filling. -ERCP done on 06/11/16 by Dr. Amedeo Plenty, with sphincterotomy and placement of CBD stent. -Transaminases and total bilirubin improving after ERCP. -cont renal diet - for now if remains stable plan for elective chole in a few weeks  Likely early sepsis -WBC fluctuating -Patient is  now on Augmentin.  Acute kidney injury on CKD stage IV baseline creatinine 2.6. - had HD again today -Was on diuretics at home, creatinine increase inconsistently, -Has a R AVF if needed for HD. -He is getting acidotic and the creatinine is worsening, nephrology consulted and following for HD    Atrial Fibrillation CHA2DS2-VASc score 3, on anticoagulation with Eliquis -Rate is controlled, home medications continued -Eliquis on hold. Started on heparin drip. - Monitor closely if Eliquis will be continued. Weigh option of coumadin as well.  Chronic systolic heart failure, last echocardiogram 6/6 with Severely dilated cavity, mild LVH, EF 10-15 % Dry weight 82 kg, current weight 82.2 . Appears compensated.  - Daily weights, strict I/O  CAD, Tn 0.01 EKG Shows paced rhythm , last cardiac catheterization with stent in 2015, patient is cardiac pain free at this time. Continue meds   History of Hypotension stable for now BP 104/55 mmHg  Pulse 75  IVF on hold in the setting of severe CHF /Afib with risk of volume overload  Continue to monitor   Type II Diabetes - Optimize.  OSA on CPAP/COPD, not on acute exacerbation, stable  Continue CPAP and O2  GERD, no acute symptoms: Continue PPI  History of Rectal Cancer, s/p chemo/ XRT No acute changes.   Elevated lipase - Will repeat Lipase in am. No abdominal pain.   DVT prophylaxis: SCDs now on heparin drip. Code Status: Full Code Family Communication:  Disposition Plan:  Diet: Diet Carb Modified Fluid consistency:: Thin; Room service appropriate?: Yes  Consultants:   Nephrology  GI   Antimicrobials:   Augmentin   Objective: Filed Vitals:  06/17/16 1100 06/17/16 1120 06/17/16 1228 06/17/16 1246  BP: 109/64 108/67 94/51 107/66  Pulse: 75 70 69 72  Temp:  97.2 F (36.2 C) 97.6 F (36.4 C)   TempSrc:  Oral Oral   Resp: '16 16 16   '$ Height:      Weight:  83.3 kg (183 lb 10.3 oz)    SpO2:   90%      Intake/Output Summary (Last 24 hours) at 06/17/16 1326 Last data filed at 06/17/16 1120  Gross per 24 hour  Intake      0 ml  Output   3525 ml  Net  -3525 ml   Filed Weights   06/17/16 0432 06/17/16 0820 06/17/16 1120  Weight: 86.274 kg (190 lb 3.2 oz) 86.6 kg (190 lb 14.7 oz) 83.3 kg (183 lb 10.3 oz)    Examination: General exam: Appears calm and comfortable  Respiratory system: Clear to auscultation. Respiratory effort normal. Cardiovascular system: S1 & S2 heard, RRR. No JVD, murmurs, rubs, gallops or clicks. No pedal edema. Gastrointestinal system: Abdomen is nondistended, soft and nontender. No organomegaly or masses felt. Normal bowel sounds heard. Central nervous system: Alert and oriented. No focal neurological deficits. Extremities: Symmetric 5 x 5 power. Skin: No rashes, lesions or ulcers Psychiatry: Judgement and insight appear normal. Mood & affect appropriate.   Data Reviewed: I have personally reviewed following labs and imaging studies  CBC:  Recent Labs Lab 06/14/16 0257 06/14/16 0958 06/15/16 0210 06/16/16 0220 06/17/16 0241  WBC 20.4* 17.5* 15.0* 17.0* 20.0*  NEUTROABS  --  16.0*  --   --   --   HGB 10.9* 10.8* 10.9* 11.4* 11.0*  HCT 31.1* 31.0* 31.7* 33.0* 31.4*  MCV 91.5 91.2 90.3 91.2 91.3  PLT 82* 75* 75* 85* 073*   Basic Metabolic Panel:  Recent Labs Lab 06/14/16 0257 06/14/16 0958 06/15/16 0210 06/16/16 0220 06/17/16 0241  NA 124* 126* 124* 127* 127*  K 4.9 4.9 5.3* 5.0 5.6*  CL 91* 92* 90* 92* 93*  CO2 16* 17* 16* 18* 18*  GLUCOSE 268* 213* 185* 165* 142*  BUN 100* 106* 120* 100* 111*  CREATININE 5.72* 5.82* 6.07* 5.65* 6.42*  CALCIUM 7.4* 7.8* 8.5* 9.1 9.2   GFR: Estimated Creatinine Clearance: 12.5 mL/min (by C-G formula based on Cr of 6.42). Liver Function Tests:  Recent Labs Lab 06/14/16 0257 06/14/16 0958 06/15/16 0210 06/16/16 0220 06/17/16 0241  AST 268* 266* 200* 134* 113*  ALT 171* 171* 150* 125* 117*   ALKPHOS 161* 168* 160* 156* 151*  BILITOT 4.2* 4.1* 4.1* 3.5* 3.2*  PROT 7.4 7.3 7.2 7.5 7.5  ALBUMIN 2.0* 2.0* 2.0* 2.1* 2.1*    Recent Labs Lab 06/11/16 0732  LIPASE 396*   No results for input(s): AMMONIA in the last 168 hours. Coagulation Profile: No results for input(s): INR, PROTIME in the last 168 hours. Cardiac Enzymes: No results for input(s): CKTOTAL, CKMB, CKMBINDEX, TROPONINI in the last 168 hours. BNP (last 3 results) No results for input(s): PROBNP in the last 8760 hours. HbA1C: No results for input(s): HGBA1C in the last 72 hours. CBG:  Recent Labs Lab 06/16/16 1146 06/16/16 1629 06/16/16 2110 06/17/16 0614 06/17/16 1227  GLUCAP 128* 136* 156* 122* 81   Lipid Profile: No results for input(s): CHOL, HDL, LDLCALC, TRIG, CHOLHDL, LDLDIRECT in the last 72 hours. Thyroid Function Tests: No results for input(s): TSH, T4TOTAL, FREET4, T3FREE, THYROIDAB in the last 72 hours. Anemia Panel: No results for input(s): VITAMINB12, FOLATE, FERRITIN,  TIBC, IRON, RETICCTPCT in the last 72 hours. Urine analysis:    Component Value Date/Time   COLORURINE YELLOW 06/09/2016 1023   APPEARANCEUR CLEAR 06/09/2016 1023   LABSPEC 1.017 06/09/2016 1023   PHURINE 5.5 06/09/2016 1023   GLUCOSEU >1000* 06/09/2016 1023   HGBUR NEGATIVE 06/09/2016 1023   BILIRUBINUR NEGATIVE 06/09/2016 1023   KETONESUR NEGATIVE 06/09/2016 1023   PROTEINUR NEGATIVE 06/09/2016 1023   UROBILINOGEN 1.0 08/23/2015 0322   NITRITE NEGATIVE 06/09/2016 1023   LEUKOCYTESUR NEGATIVE 06/09/2016 1023   Sepsis Labs: '@LABRCNTIP'$ (procalcitonin:4,lacticidven:4)  ) Recent Results (from the past 240 hour(s))  Culture, blood (Routine X 2) w Reflex to ID Panel     Status: None   Collection Time: 06/11/16  7:25 AM  Result Value Ref Range Status   Specimen Description BLOOD LEFT HAND  Final   Special Requests BOTTLES DRAWN AEROBIC AND ANAEROBIC  5CC  Final   Culture NO GROWTH 5 DAYS  Final   Report Status  06/16/2016 FINAL  Final  Culture, blood (Routine X 2) w Reflex to ID Panel     Status: None   Collection Time: 06/11/16  7:29 AM  Result Value Ref Range Status   Specimen Description BLOOD LEFT HAND  Final   Special Requests BOTTLES DRAWN AEROBIC ONLY  5CC  Final   Culture NO GROWTH 5 DAYS  Final   Report Status 06/16/2016 FINAL  Final     Invalid input(s): PROCALCITONIN, [place]   Radiology Studies: No results found.      Scheduled Meds: . amoxicillin-clavulanate  1 tablet Oral QHS  . ferrous sulfate  325 mg Oral BID WC  . insulin aspart  0-15 Units Subcutaneous TID WC  . insulin glargine  15 Units Subcutaneous QHS  . metoprolol succinate  12.5 mg Oral BID  . pantoprazole  40 mg Oral Daily  . sodium chloride flush  3 mL Intravenous Q12H   Continuous Infusions: . heparin 1,050 Units/hr (06/17/16 0724)     LOS: 7 days    Time spent: 35 minutes    Mir Marry Guan, MD [facility] Pager [phone]  If 7PM-7AM, please contact night-coverage [email or web address] Password [email or web address] 06/17/2016, 1:26 PM

## 2016-06-17 NOTE — Procedures (Signed)
I was present at this dialysis session. I have reviewed the session itself and made appropriate changes.   UF goal 3L. 2K bath.  Using AVF, 17g x2.  Sig worsened SCr/BUN over 24h argues against sufficient GFR.  Pt w/o c/o.  Nex HD tentative 7/15.  Awaiting CLIP to [facility].   Filed Weights   06/16/16 0622 06/17/16 0432 06/17/16 0820  Weight: 86.093 kg (189 lb 12.8 oz) 86.274 kg (190 lb 3.2 oz) 86.6 kg (190 lb 14.7 oz)     Recent Labs Lab 06/17/16 0241  NA 127*  K 5.6*  CL 93*  CO2 18*  GLUCOSE 142*  BUN 111*  CREATININE 6.42*  CALCIUM 9.2     Recent Labs Lab 06/14/16 0958 06/15/16 0210 06/16/16 0220 06/17/16 0241  WBC 17.5* 15.0* 17.0* 20.0*  NEUTROABS 16.0*  --   --   --   HGB 10.8* 10.9* 11.4* 11.0*  HCT 31.0* 31.7* 33.0* 31.4*  MCV 91.2 90.3 91.2 91.3  PLT 75* 75* 85* 118*    Scheduled Meds: . amoxicillin-clavulanate  1 tablet Oral QHS  . ferrous sulfate  325 mg Oral BID WC  . insulin aspart  0-15 Units Subcutaneous TID WC  . insulin glargine  15 Units Subcutaneous QHS  . metoprolol succinate  12.5 mg Oral BID  . pantoprazole  40 mg Oral Daily  . sodium chloride flush  3 mL Intravenous Q12H   Continuous Infusions: . heparin 1,050 Units/hr (06/17/16 0724)   PRN Meds:.sodium chloride, sodium chloride, alteplase, bisacodyl, heparin, HYDROcodone-acetaminophen, lidocaine (PF), lidocaine-prilocaine, magnesium citrate, morphine injection, morphine injection, ondansetron **OR** ondansetron (ZOFRAN) IV, pentafluoroprop-tetrafluoroeth, senna-docusate, traZODone   Robert Grippe  MD 06/17/2016, 8:41 AM

## 2016-06-17 NOTE — Progress Notes (Signed)
Advanced Heart Failure Rounding Note  Referring Physician: Dr Lily Kocher PCP: Dr. Gerarda Fraction Oncologist: Dr Benay Spice.  CHF: [place]  Reason for Consultation: Cardiac consideration/clearance for GI surgery.   Subjective:    60 y/o with brittle systolic HF. EF 20% and CKD IV. Admitted 06/09/16 with recurrent biliary colic found to have biliary pancreatitis. Course c/b worsening renal failure  Underwent ERCP with stent placement on 7/7 with symptom improvement.   WBC 23.6 -> 20.4 -> 15.0 -> 17.0 -> 20.0.   Started on dialysis 06/15/16. Currently undergoing HD #2. Feeling OK this morning. Thinks he peed pretty well last night on his own.  Denies SOB or CP this am.  Belly a "little sore". Otherwise no complaints.   Weight up 1 lb. Only 150 cc of UO yesterday. Creatinine 3.98 -> 4.76 -> 5.47 -> 5.72 -> 6.07 -> dialysis -> 5.65 -> 6.4  Objective:   Weight Range: 190 lb 3.2 oz (86.274 kg) Body mass index is 27.29 kg/(m^2).   Vital Signs:   Pulse Rate:  [70-76] 73 (07/13 0432) Resp:  [16] 16 (07/12 2020) BP: (104-114)/(60-65) 110/60 mmHg (07/13 0432) SpO2:  [99 %-100 %] 100 % (07/13 0432) Weight:  [190 lb 3.2 oz (86.274 kg)] 190 lb 3.2 oz (86.274 kg) (07/13 0432) Last BM Date: 06/14/16  Weight change: Filed Weights   06/15/16 1716 06/16/16 0622 06/17/16 0432  Weight: 189 lb 13.1 oz (86.1 kg) 189 lb 12.8 oz (86.093 kg) 190 lb 3.2 oz (86.274 kg)    Intake/Output:   Intake/Output Summary (Last 24 hours) at 06/17/16 0807 Last data filed at 06/17/16 0751  Gross per 24 hour  Intake      0 ml  Output    675 ml  Net   -675 ml     Physical Exam: General: NAD,Undergoing dialysis.  HEENT: Scleral icterus, mild jaundice Neck: JVP 6-7 cm, no thyromegaly or nodule nodule.  No carotid bruit.  Lungs: CTAB, normal effort.  CV: Nondisplaced PMI. Regular S1S2, 2/6 SEM RUSB.  Abdomen: Soft, slightly tender, non-distended, no HSM. No bruits or masses. +BS . s/p colostomy. Skin:  Intact without lesions or rashes.  Neurologic: Alert and oriented x 3.  Psych: Normal affect. Extremities: No clubbing or cyanosis.  Trace ankle edema.Normal pedal pulses.    Telemetry: Reviewed personally, Vpaced rhythm 70s  Labs: CBC  Recent Labs  06/14/16 0958  06/16/16 0220 06/17/16 0241  WBC 17.5*  < > 17.0* 20.0*  NEUTROABS 16.0*  --   --   --   HGB 10.8*  < > 11.4* 11.0*  HCT 31.0*  < > 33.0* 31.4*  MCV 91.2  < > 91.2 91.3  PLT 75*  < > 85* 118*  < > = values in this interval not displayed. Basic Metabolic Panel  Recent Labs  06/16/16 0220 06/17/16 0241  NA 127* 127*  K 5.0 5.6*  CL 92* 93*  CO2 18* 18*  GLUCOSE 165* 142*  BUN 100* 111*  CREATININE 5.65* 6.42*  CALCIUM 9.1 9.2   Liver Function Tests  Recent Labs  06/16/16 0220 06/17/16 0241  AST 134* 113*  ALT 125* 117*  ALKPHOS 156* 151*  BILITOT 3.5* 3.2*  PROT 7.5 7.5  ALBUMIN 2.1* 2.1*   No results for input(s): LIPASE, AMYLASE in the last 72 hours. Cardiac Enzymes No results for input(s): CKTOTAL, CKMB, CKMBINDEX, TROPONINI in the last 72 hours.  BNP: BNP (last 3 results)  Recent Labs  09/26/15 1820 05/09/16 2343  06/01/16 1200  BNP 1393.4* 377.6* 1016.7*    ProBNP (last 3 results) No results for input(s): PROBNP in the last 8760 hours.   D-Dimer No results for input(s): DDIMER in the last 72 hours. Hemoglobin A1C No results for input(s): HGBA1C in the last 72 hours. Fasting Lipid Panel No results for input(s): CHOL, HDL, LDLCALC, TRIG, CHOLHDL, LDLDIRECT in the last 72 hours. Thyroid Function Tests No results for input(s): TSH, T4TOTAL, T3FREE, THYROIDAB in the last 72 hours.  Invalid input(s): FREET3  Other results:     Imaging/Studies:  No results found.  Latest Echo  Latest Cath   Medications:     Scheduled Medications: . amoxicillin-clavulanate  1 tablet Oral QHS  . ferrous sulfate  325 mg Oral BID WC  . insulin aspart  0-15 Units Subcutaneous TID  WC  . insulin glargine  15 Units Subcutaneous QHS  . metoprolol succinate  12.5 mg Oral BID  . pantoprazole  40 mg Oral Daily  . sodium chloride flush  3 mL Intravenous Q12H    Infusions: . heparin 1,050 Units/hr (06/17/16 0724)    PRN Medications: bisacodyl, HYDROcodone-acetaminophen, magnesium citrate, morphine injection, morphine injection, ondansetron **OR** ondansetron (ZOFRAN) IV, senna-docusate, traZODone   Assessment / Plan   1. Acute on chronic cholecystitis - s/p ERCP  - WBCs slightly up again. No fever noted.. Liver enzymes continue to trend down.  - Symptomatically improved. Tolerating carb modified diet 2. Chronic systolic CHF - ICM - Echo 05/11/16 with LVEF 10-15%, mild AI, mild MR, normal RV.  - Volume status controlled ref dialysis.  - Further diuretics, if any, per Renal.  - Continue Toprol XL 12.5 mg BID (home dose). Will not up-titrate today with HD.  3. Chronic Afib with CHADS2VASC score of 5. S/p AV node ablation V-paced  4. CAD s/p NSTEMI in 04/20/16 - No CP.  5. AKI on CKD stage IV s/p AVF in RUE  - Dialysis started 06/15/16 with creatinine > 6. Cr 6.4 today. Currently undergoing dialysis.  Will likely  need permanent dialysis moving forward. 6. H/o rectal cancer 2008: Has diverting colostomy. He has follow up with Dr Benay Spice. - Recent CT (05/02/16) with no evidence of recurrence. 7. HLD.  Satira Mccallum Tillery PA-C 06/17/2016, 8:07 AM    Advanced Heart Failure Team Pager [phone] (M-F; 7a - 4p)  Please contact [facility] for night-coverage after hours (4p -7a ) and weekends on [email or web address]  Agree with the above.  He has undergone 2nd HD session today.  We will continue to follow along, looks like HD will be permanent.   Loralie Champagne 06/17/2016

## 2016-06-17 NOTE — Progress Notes (Signed)
ANTICOAGULATION CONSULT NOTE - Follow Up Consult  Pharmacy Consult for heparin Indication: atrial fibrillation  No Known Allergies  Patient Measurements: Height: 5\' 10"  (177.8 cm) Weight: 183 lb 10.3 oz (83.3 kg) (standing) IBW/kg (Calculated) : 73 Heparin Dosing Weight: 82 kg  Vital Signs: Temp: 97.6 F (36.4 C) (07/13 1228) Temp Source: Oral (07/13 1228) BP: 107/66 mmHg (07/13 1246) Pulse Rate: 72 (07/13 1246)  Labs:  Recent Labs  06/15/16 0210 06/16/16 0220  06/17/16 0241 06/17/16 1554 06/17/16 1940  HGB 10.9* 11.4*  --  11.0*  --   --   HCT 31.7* 33.0*  --  31.4*  --   --   PLT 75* 85*  --  118*  --   --   HEPARINUNFRC 0.47 0.18*  < > 0.24* 1.62* 0.27*  CREATININE 6.07* 5.65*  --  6.42*  --   --   < > = values in this interval not displayed.  Estimated Creatinine Clearance: 12.5 mL/min (by C-G formula based on Cr of 6.42).   Medications:  Infusions:  . heparin 1,050 Units/hr (06/17/16 0724)    Assessment: 59yom admitted with acute cholecystis s/p ERCP and stent placed.PTA apixaban held on admit for possible surgery and remains on hold until patient more stable. Last apixaban dose 7/4 PTA. Pt on heparin bridge for stroke prevention until apixaban restarted. Will not bolus and will keep HL at low end of goal to decrease bleeding risk.  PM heparin level = 0.27    Goal of Therapy:  Heparin level 0.3-0.5 units/ml Monitor platelets by anticoagulation protocol: Yes   Plan:  Increase heparin gtt to 1200 units/hr Follow up AM heparin level, CBC  Thank you Anette Guarneri, PharmD [phone]  06/17/2016 9:20 PM

## 2016-06-18 ENCOUNTER — Ambulatory Visit: Payer: Self-pay | Admitting: *Deleted

## 2016-06-18 LAB — COMPREHENSIVE METABOLIC PANEL
ALT: 91 U/L — AB (ref 17–63)
AST: 86 U/L — AB (ref 15–41)
Albumin: 1.9 g/dL — ABNORMAL LOW (ref 3.5–5.0)
Alkaline Phosphatase: 133 U/L — ABNORMAL HIGH (ref 38–126)
Anion gap: 14 (ref 5–15)
BUN: 63 mg/dL — AB (ref 6–20)
CHLORIDE: 92 mmol/L — AB (ref 101–111)
CO2: 21 mmol/L — AB (ref 22–32)
CREATININE: 4.93 mg/dL — AB (ref 0.61–1.24)
Calcium: 8.9 mg/dL (ref 8.9–10.3)
GFR calc Af Amer: 13 mL/min — ABNORMAL LOW (ref >60)
GFR calc non Af Amer: 12 mL/min — ABNORMAL LOW (ref >60)
Glucose, Bld: 106 mg/dL — ABNORMAL HIGH (ref 65–99)
POTASSIUM: 4.9 mmol/L (ref 3.5–5.1)
SODIUM: 127 mmol/L — AB (ref 135–145)
Total Bilirubin: 2.6 mg/dL — ABNORMAL HIGH (ref 0.3–1.2)
Total Protein: 6.9 g/dL (ref 6.5–8.1)

## 2016-06-18 LAB — CBC
HEMATOCRIT: 34.1 % — AB (ref 39.0–52.0)
Hemoglobin: 11.9 g/dL — ABNORMAL LOW (ref 13.0–17.0)
MCH: 31.9 pg (ref 26.0–34.0)
MCHC: 34.9 g/dL (ref 30.0–36.0)
MCV: 91.4 fL (ref 78.0–100.0)
PLATELETS: 93 10*3/uL — AB (ref 150–400)
RBC: 3.73 MIL/uL — AB (ref 4.22–5.81)
RDW: 13.5 % (ref 11.5–15.5)
WBC: 12 10*3/uL — ABNORMAL HIGH (ref 4.0–10.5)

## 2016-06-18 LAB — GLUCOSE, CAPILLARY
GLUCOSE-CAPILLARY: 146 mg/dL — AB (ref 65–99)
Glucose-Capillary: 120 mg/dL — ABNORMAL HIGH (ref 65–99)
Glucose-Capillary: 154 mg/dL — ABNORMAL HIGH (ref 65–99)
Glucose-Capillary: 210 mg/dL — ABNORMAL HIGH (ref 65–99)

## 2016-06-18 LAB — HEPARIN LEVEL (UNFRACTIONATED): Heparin Unfractionated: 0.37 IU/mL (ref 0.30–0.70)

## 2016-06-18 IMAGING — CT CT ABD-PELV W/O CM
3 of 4 series · 12 of 36 positions shown, 19 images · IV contrast (READICAT/WATER)
Comparison: Ultrasound 01/10/2015.  CT 05/20/2014.

CLINICAL DATA: Right upper quadrant pain of 6 months duration. Some
nausea. Weight loss. Personal history of adenocarcinoma of the
rectum with colostomy in 1882.

EXAM:
CT ABDOMEN AND PELVIS WITHOUT CONTRAST
TECHNIQUE: Multidetector CT imaging of the abdomen and pelvis was performed
following the standard protocol without IV contrast.

[Series 3: abd/pelvis w/o · axial · non-contrast · 0.88mm/px · z∈[-458,-52]mm · 9 of 103 slices shown, 15 images]
[im 11/103  soft-tissue]
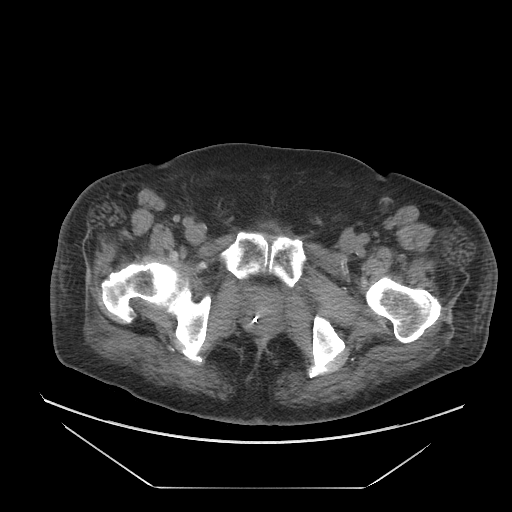
[im 11/103  bone]
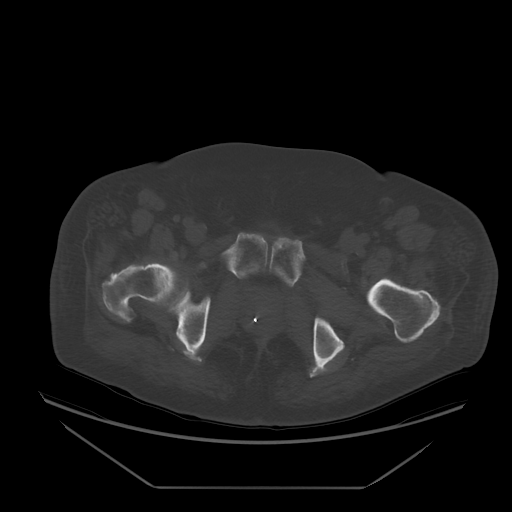
[im 21/103  soft-tissue]
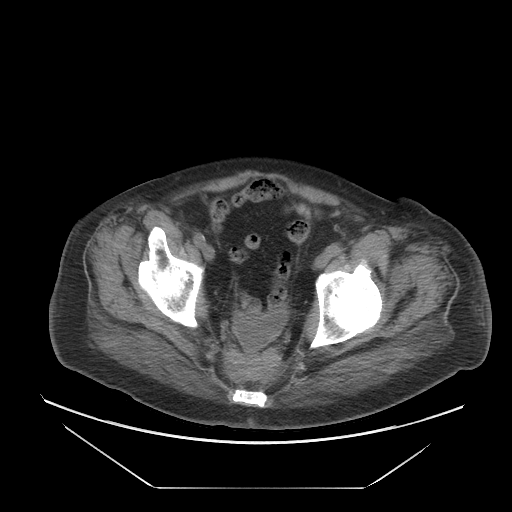
[im 31/103  soft-tissue]
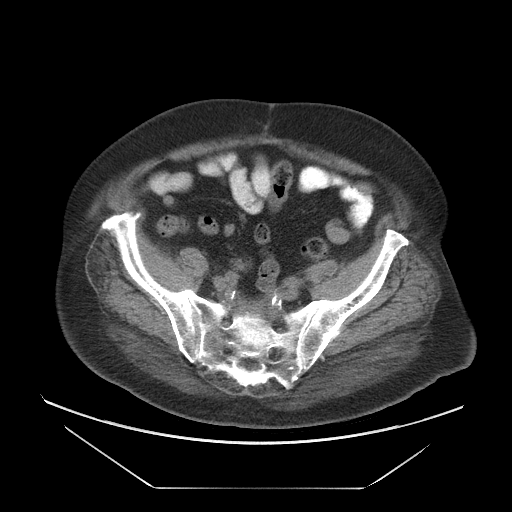
[im 41/103  soft-tissue]
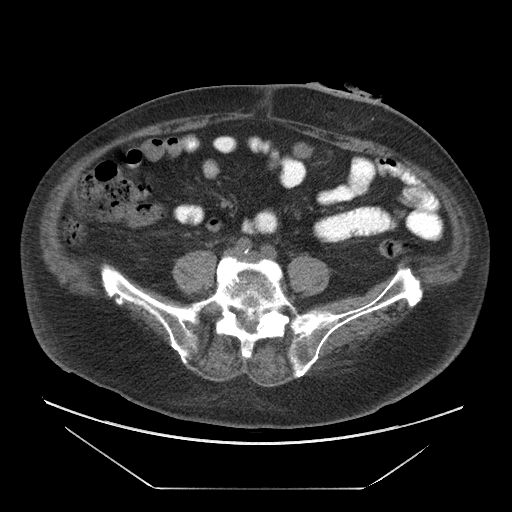
[im 52/103  soft-tissue]
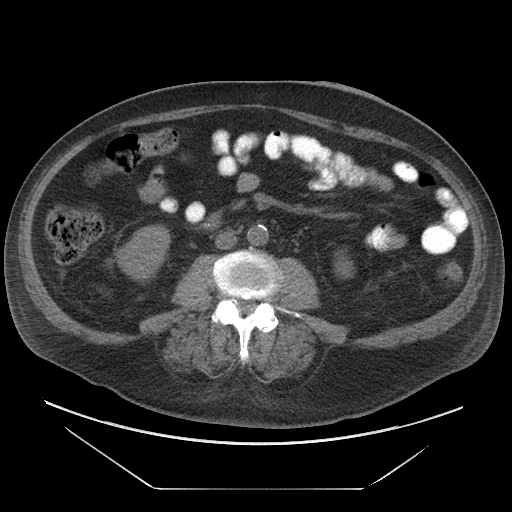
[im 62/103  soft-tissue]
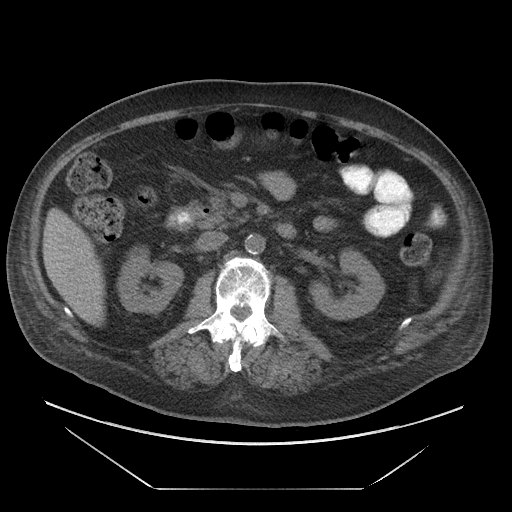
[im 62/103  lung]
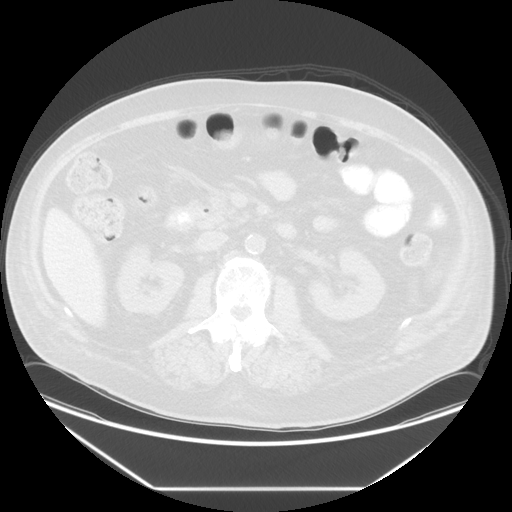
[im 72/103  soft-tissue]
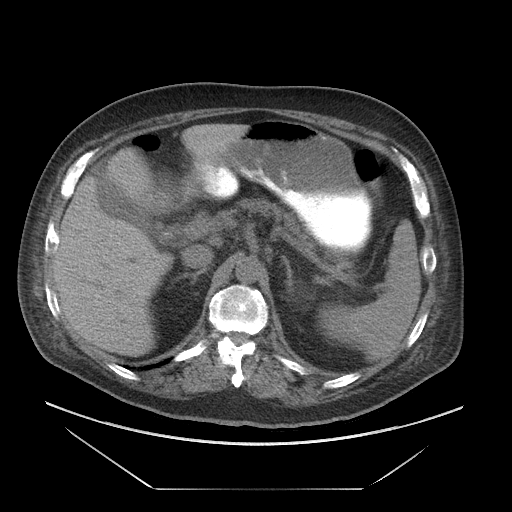
[im 72/103  lung]
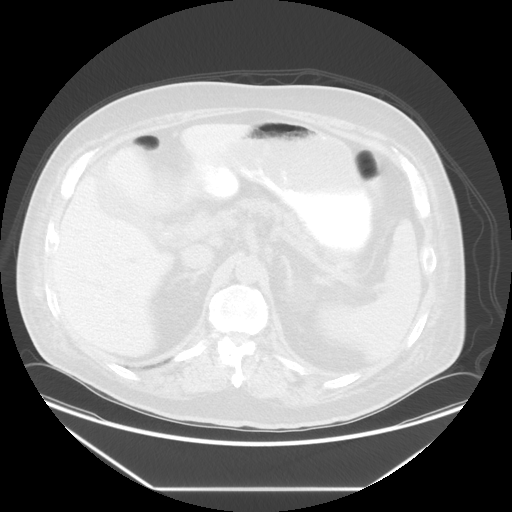
[im 82/103  soft-tissue]
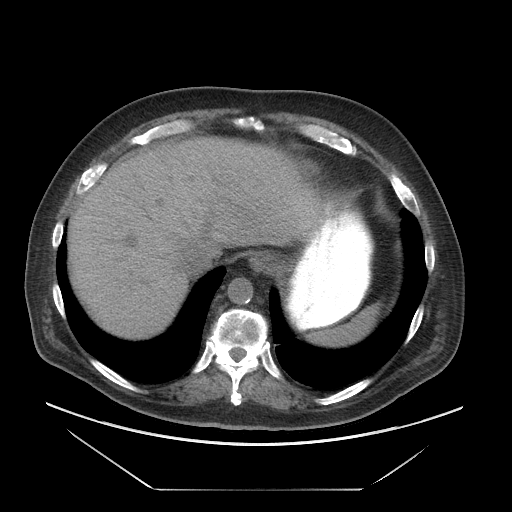
[im 82/103  lung]
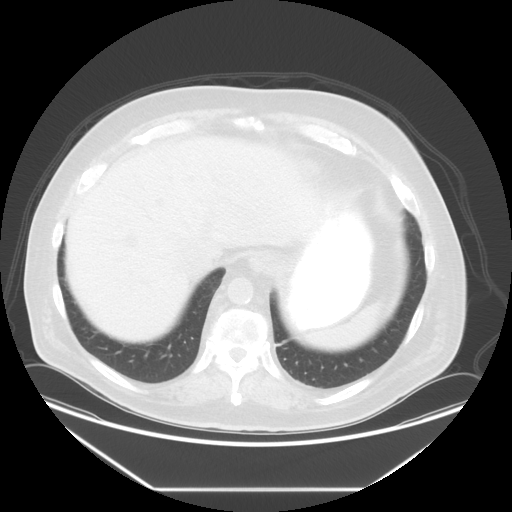
[im 92/103  soft-tissue]
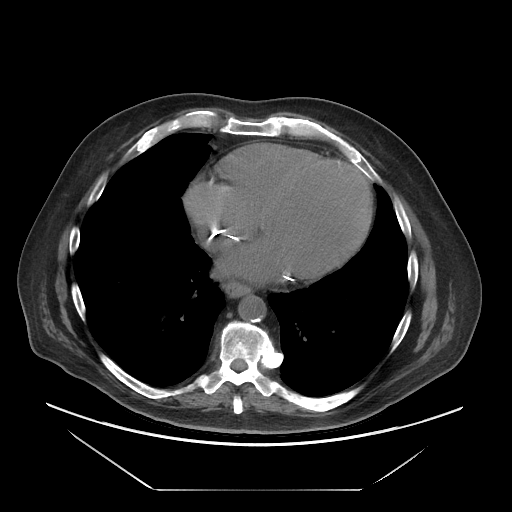
[im 92/103  lung]
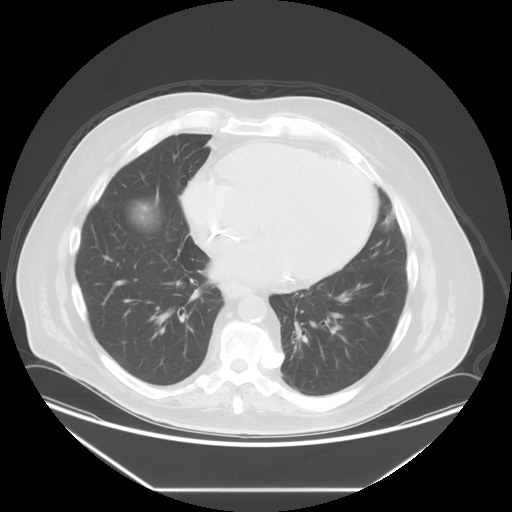
[im 92/103  bone]
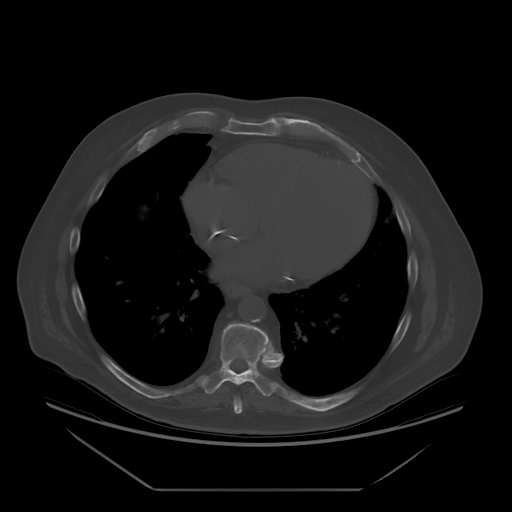

[Series 601: coronal body · coronal · 1.00mm/px · 1 of 129 slices shown, 2 images]
[im 43/129  soft-tissue]
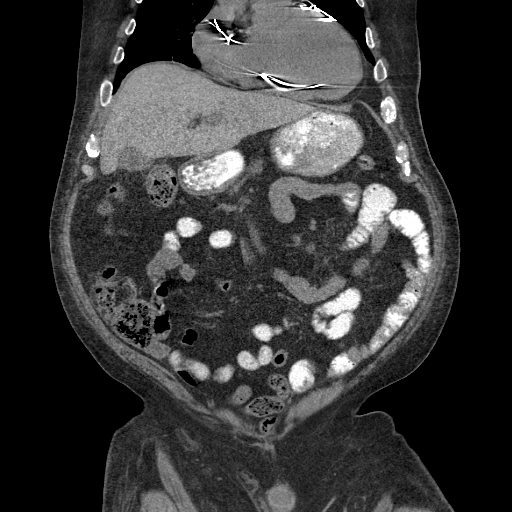
[im 43/129  bone]
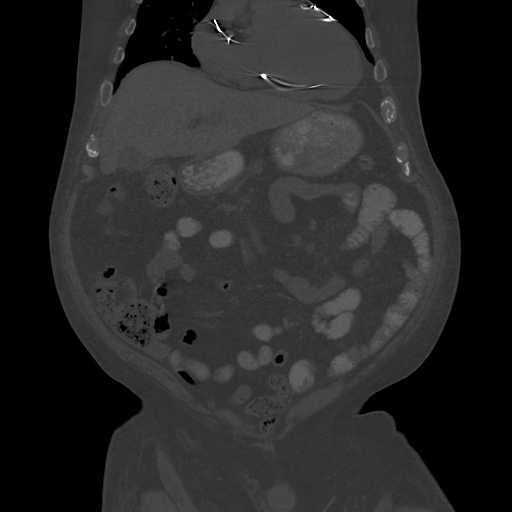

[Series 602: sagittal body · sagittal · 1.00mm/px · 2 of 178 slices shown]
[im 21/178  soft-tissue]
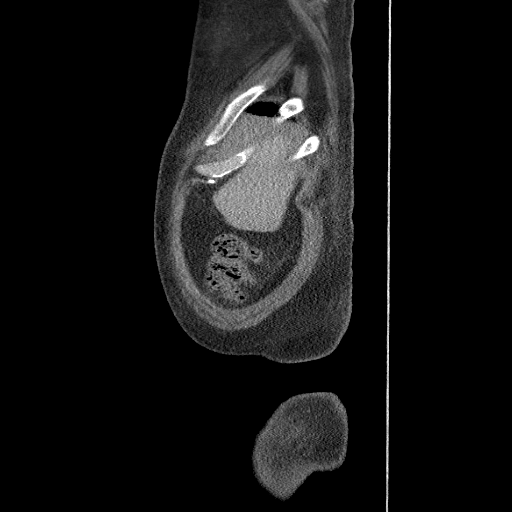
[im 42/178  soft-tissue]
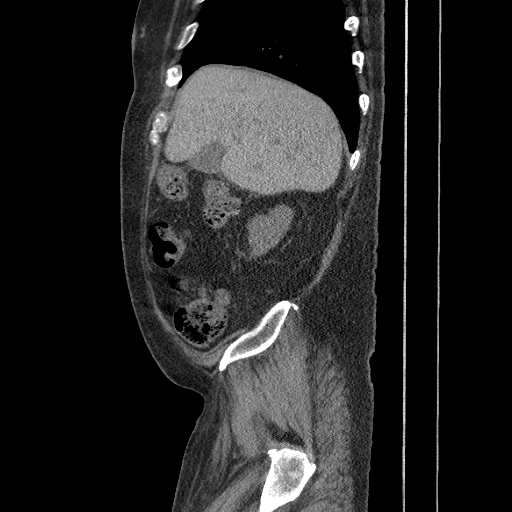

[12 of 36 positions shown; findings below may reference images not displayed]

FINDINGS: Lung bases are clear except for mild scarring an a chronic benign 2
mm nodule in the right middle lobe. No pleural or pericardial fluid.

The liver has a normal appearance without contrast. There are a few
small calcifications in the gallbladder neck that could be small
gallstones. No sign of gallbladder dilatation or gross inflammatory
change. The spleen is normal. The pancreas is normal. Small duodenum
diverticulum in the pancreatic head region. The adrenal glands are
normal. The kidneys are normal. No cyst, mass, stone or
hydronephrosis. There is atherosclerosis of the aorta but no
aneurysm. The IVC is normal. No retroperitoneal adenopathy. No free
intraperitoneal fluid or air. No acute bowel pathology. The appendix
is normal. There has been previous AP resection. Left lower quadrant
ostomy appears unremarkable. No sign of bowel obstruction. Chronic
presacral scarring appears the same. Seed implants in the prostate
appear the same. Ordinary degenerative changes affect the spine.
IMPRESSION: No acute or significant finding. There may be a few small stones in
the gallbladder neck but there is no CT evidence of cholecystitis or
obstruction.

Previous AP resection. Left lower quadrant ostomy. No acute bowel
pathology. No sign of active neoplastic disease in the abdomen or
pelvis.

## 2016-06-18 MED ORDER — WARFARIN VIDEO
Freq: Once | Status: AC
  Administered 2016-06-18: 14:00:00

## 2016-06-18 MED ORDER — COUMADIN BOOK
Freq: Once | Status: AC
  Administered 2016-06-18: 14:00:00
  Filled 2016-06-18: qty 1

## 2016-06-18 MED ORDER — WARFARIN SODIUM 5 MG PO TABS: 5.0000 mg | ORAL_TABLET | Freq: Once | ORAL | Status: DC

## 2016-06-18 MED ORDER — WARFARIN - PHARMACIST DOSING INPATIENT: Freq: Every day | Status: DC

## 2016-06-18 MED ORDER — WARFARIN SODIUM 7.5 MG PO TABS
7.5000 mg | ORAL_TABLET | Freq: Once | ORAL | Status: AC
  Administered 2016-06-18: 7.5 mg via ORAL
  Filled 2016-06-18: qty 1

## 2016-06-18 NOTE — Progress Notes (Signed)
Advanced Heart Failure Rounding Note  Referring Physician: Dr Lily Kocher PCP: Dr. Gerarda Fraction Oncologist: Dr Benay Spice.  CHF: Bensimhon  Reason for Consultation: Cardiac consideration/clearance for GI surgery.   Subjective:    60 y/o with brittle systolic HF. EF 20% and CKD IV. Admitted 06/09/16 with recurrent biliary colic found to have biliary pancreatitis. Course c/b worsening renal failure  Underwent ERCP with stent placement on 7/7 with symptom improvement.   WBC 23.6 -> 20.4 -> 15.0 -> 17.0 -> 20.0 -> 12.0  Started on dialysis 06/15/16. Next dialysis tentatively 06/19/16.  Awaiting dialysis arrangement as outpatient.   Abdominal pain resolved ( was sore yesterday). Denies SOB, CP, lightheadedness, or dizziness.   Weight down with dialysis. Creatinine 5.72 -> 6.07 -> dialysis -> 5.65 -> 6.4 -> dialysis -> 4.9  Objective:   Weight Range: 184 lb 6.4 oz (83.643 kg) Body mass index is 26.46 kg/(m^2).   Vital Signs:   Temp:  [97.2 F (36.2 C)-98.4 F (36.9 C)] 97.6 F (36.4 C) (07/14 0612) Pulse Rate:  [69-77] 70 (07/14 0612) Resp:  [16-18] 17 (07/14 0612) BP: (94-110)/(51-68) 100/53 mmHg (07/14 0612) SpO2:  [90 %-100 %] 98 % (07/14 0612) Weight:  [183 lb 10.3 oz (83.3 kg)-190 lb 14.7 oz (86.6 kg)] 184 lb 6.4 oz (83.643 kg) (07/14 0612) Last BM Date: 06/14/16  Weight change: Filed Weights   06/17/16 0820 06/17/16 1120 06/18/16 0612  Weight: 190 lb 14.7 oz (86.6 kg) 183 lb 10.3 oz (83.3 kg) 184 lb 6.4 oz (83.643 kg)    Intake/Output:   Intake/Output Summary (Last 24 hours) at 06/18/16 0725 Last data filed at 06/17/16 2234  Gross per 24 hour  Intake    820 ml  Output   3200 ml  Net  -2380 ml     Physical Exam: General: NAD,in bed  HEENT: Normal Neck: JVP 7-8 cm, no thyromegaly or nodule nodule.  No carotid bruit.  Lungs: CTAB, normal effort.  CV: Nondisplaced PMI. Regular S1S2, 2/6 SEM RUSB.  Abdomen: Soft, NT, ND, no HSM. No bruits or masses. +BS .  s/p colostomy. Skin: Intact without lesions or rashes.  Neurologic: Alert and oriented x 3.  Psych: Normal affect. Extremities: No clubbing or cyanosis.  No ankle edema.Normal pedal pulses.   Telemetry: Reviewed personally, Vpaced rhythm 70s  Labs: CBC  Recent Labs  06/17/16 0241 06/18/16 0209  WBC 20.0* 12.0*  HGB 11.0* 11.9*  HCT 31.4* 34.1*  MCV 91.3 91.4  PLT 118* 93*   Basic Metabolic Panel  Recent Labs  06/17/16 0241 06/18/16 0209  NA 127* 127*  K 5.6* 4.9  CL 93* 92*  CO2 18* 21*  GLUCOSE 142* 106*  BUN 111* 63*  CREATININE 6.42* 4.93*  CALCIUM 9.2 8.9   Liver Function Tests  Recent Labs  06/17/16 0241 06/18/16 0209  AST 113* 86*  ALT 117* 91*  ALKPHOS 151* 133*  BILITOT 3.2* 2.6*  PROT 7.5 6.9  ALBUMIN 2.1* 1.9*   No results for input(s): LIPASE, AMYLASE in the last 72 hours. Cardiac Enzymes No results for input(s): CKTOTAL, CKMB, CKMBINDEX, TROPONINI in the last 72 hours.  BNP: BNP (last 3 results)  Recent Labs  09/26/15 1820 05/09/16 2343 06/01/16 1200  BNP 1393.4* 377.6* 1016.7*    ProBNP (last 3 results) No results for input(s): PROBNP in the last 8760 hours.   D-Dimer No results for input(s): DDIMER in the last 72 hours. Hemoglobin A1C No results for input(s): HGBA1C in the last  72 hours. Fasting Lipid Panel No results for input(s): CHOL, HDL, LDLCALC, TRIG, CHOLHDL, LDLDIRECT in the last 72 hours. Thyroid Function Tests No results for input(s): TSH, T4TOTAL, T3FREE, THYROIDAB in the last 72 hours.  Invalid input(s): FREET3  Other results:     Imaging/Studies:  No results found.  Latest Echo  Latest Cath   Medications:     Scheduled Medications: . amoxicillin-clavulanate  1 tablet Oral QHS  . ferrous sulfate  325 mg Oral BID WC  . insulin aspart  0-15 Units Subcutaneous TID WC  . insulin glargine  15 Units Subcutaneous QHS  . metoprolol succinate  12.5 mg Oral BID  . pantoprazole  40 mg Oral Daily    . sodium chloride flush  3 mL Intravenous Q12H    Infusions: . heparin 1,200 Units/hr (06/17/16 2157)    PRN Medications: bisacodyl, HYDROcodone-acetaminophen, magnesium citrate, morphine injection, morphine injection, ondansetron **OR** ondansetron (ZOFRAN) IV, senna-docusate, traZODone   Assessment / Plan   1. Acute on chronic cholecystitis - s/p ERCP  - WBCs now back down. No fever noted.. Liver enzymes continue to trend down.  - Symptomatically improved. Tolerating diet.  2. Chronic systolic CHF - ICM - Echo 05/11/16 with LVEF 10-15%, mild AI, mild MR, normal RV.  - Volume status controlled ref dialysis.  - Continue Toprol XL 12.5 mg BID (home dose). Will not increase with pressures on softer side (90-100) and HD. 3. Chronic Afib with CHADS2VASC score of 5. S/p AV node ablation V-paced  - Was previously on eliquis.   - Will likely need Coumadin moving forward with dialysis.  4. CAD s/p NSTEMI in 04/20/16 - No CP.  5. AKI on CKD stage IV s/p AVF in RUE  - Dialysis started 06/15/16 with creatinine > 6. Cr 6.4 today. Currently undergoing dialysis.  Will likely need permanent dialysis moving forward. 6. H/o rectal cancer 2008: Has diverting colostomy. He has follow up with Dr Benay Spice. - Recent CT (05/02/16) with no evidence of recurrence. 7. HLD.  Looks like he will require permanent dialysis.   Relatively stable from a HF perspective with HD for volume management.  We will see again on Monday barring any problems.   Satira Mccallum Tillery PA-C 06/18/2016, 7:25 AM    Advanced Heart Failure Team Pager [phone] (M-F; 7a - 4p)  Please contact [facility] for night-coverage after hours (4p -7a ) and weekends on [email or web address]  Patient seen with PA, agree with the above note.  Tolerating HD.  No BP room to push cardiac meds higher at this time.  Now that on HD, he will be on warfarin instead of Eliquis.  Will start warfarin today.  He is on heparin gtt.    We will see again on  Monday.   Loralie Champagne 06/18/2016

## 2016-06-18 NOTE — Progress Notes (Signed)
ANTICOAGULATION CONSULT NOTE - Follow Up Consult  Pharmacy Consult for Coumadin , continue heparin bridge.  Indication: atrial fibrillation  No Known Allergies  Patient Measurements: Height: 5\' 10"  (177.8 cm) Weight: 184 lb 6.4 oz (83.643 kg) (a scale) IBW/kg (Calculated) : 73 Heparin Dosing Weight: 82 kg  Labs:  Recent Labs  06/16/16 0220  06/17/16 0241 06/17/16 1554 06/17/16 1940 06/18/16 0209 06/18/16 0412  HGB 11.4*  --  11.0*  --   --  11.9*  --   HCT 33.0*  --  31.4*  --   --  34.1*  --   PLT 85*  --  118*  --   --  93*  --   HEPARINUNFRC 0.18*  < > 0.24* 1.62* 0.27*  --  0.37  CREATININE 5.65*  --  6.42*  --   --  4.93*  --   < > = values in this interval not displayed.  Estimated Creatinine Clearance: 16.2 mL/min (by C-G formula based on Cr of 4.93).   Past Medical History  Diagnosis Date  . Essential hypertension, benign   . CAD (coronary artery disease)     a. BMS to LAD 2001 at [facility] b. PTCA/atherectomy ramus and BMS to LAD 2009  . Chronic systolic heart failure ([place])   . GERD (gastroesophageal reflux disease)   . Paroxysmal atrial fibrillation (HCC)     a. on amiodarone, digoxin and Eliquis  . TIA (transient ischemic attack)   . HLD (hyperlipidemia)   . Orthostatic hypotension   . Dizziness     a. chronic. Admission for this 07/18/2014  . Ischemic cardiomyopathy     EF 18% by nuclear study 2016, multiple myocardial infarctions in past    . Obesity   . Hematuria   . History of blood transfusion     "I've had 2 units so far this year" (09/27/2015)  . Anemia   . SAH (subarachnoid hemorrhage) ([place])     post-traumatic (fall) [facility] 12/2014  . PONV (postoperative nausea and vomiting)   . CHF (congestive heart failure) ([place])   . Myocardial infarction ([place]) 2001  . HCAP (healthcare-associated pneumonia) 07/21/2015  . OSA on CPAP   . Chronic kidney disease, stage IV (severe) ([place])   . DM type 2, uncontrolled, with renal complications ([place])   . Colostomy in  place [facility])   . Adenocarcinoma of rectum ([place])     a. 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 Prostate cancer ([place])     a. s/p seed implants with chemo and radiation  . Cholelithiasis 06/2015      Medications:  Infusions:  . heparin 1,200 Units/hr (06/17/16 2157)    Assessment: 59yom admitted with acute cholecystis s/p ERCP and stent placed.PTA apixaban held on admit for possible surgery and remains on hold until patient more stable. Last apixaban dose 7/4 PTA. Pt on heparin bridge for stroke prevention until oral anticoagulation restarted. Patient with AKI/CKD stage 4 in setting of acute illness.  Hemodialysis initiated 06/15/16. Dr. Hollice Gong is switching oral AC from Apixaban (pta; Held inpatient) to Coumadin due to CKD/now on hemodialysis.  Last apixaban dose taken PTA on 7/4 pm due to originally held due to potential surgery.  -He has a history of severe ICM and rectal cancer status post partial colectomy and colostomy. Albumin 1.9 AST/ALT: 268/171>200/150> 134/125>113/117>86/91, total bili  decreased to 91, acute cholecystis s/p ERCP. To continue on IV heparin drip bridging to therapeutic coumadin. Heparin level was at goal this morning. No issues noted overnight.  Hgb  low/stable at 11.9,  PLTC low/stable 93 today. INR on admit date 7/5 = 1.27, next day 7/6 INR = 1.6 (last pta apixaban dose was taken 7/4 pm).  Goal of Therapy:  Heparin level 0.3-0.5 units/ml Monitor platelets by anticoagulation protocol: Yes   Plan:  Continue heparin gtt at 1200 units/hr as previously noted. Give coumadin 5 mg today Follow up AM heparin level, CBC and INR  Thank you Nicole Cella, RPh Clinical Pharmacist Pager: [phone] 06/18/2016 11:48 AM

## 2016-06-18 NOTE — Care Management Important Message (Signed)
Important Message  Patient Details  Name: [name] MRN: [id number] Date of Birth: May 17, 1956   Medicare Important Message Given:  Yes    [name] May 06/18/2016, 11:38 AM

## 2016-06-18 NOTE — Progress Notes (Signed)
PROGRESS NOTE  [name]  YED:[id number] DOB: 19-Nov-1956 DOA: 06/09/2016 PCP: Cassell Smiles., MD Outpatient Specialists:  Subjective: No new complaints. Awake and alert. Denied fever, chills, abdominal pain. Feeling well, tolerating his diet. Has no complaints.  Brief Narrative:  RUQ abdominal pain, has history of severe ischemic cardiomyopathy and rectal cancer status post partial colectomy and colostomy.  Assessment & Plan:   Active Problems:   History of rectal cancer   Ischemic cardiomyopathy-EF 35% 04/24/15 echo   CAD with LCX disease and stents to LAD   Chronic systolic heart failure (HCC)   Paroxysmal atrial fibrillation (HCC)   DM type 2, uncontrolled, with renal complications (HCC)   Gallbladder polyp   GERD (gastroesophageal reflux disease)   Orthostatic hypotension   OSA on CPAP   Hyperlipidemia   Transaminitis   Anemia of chronic disease   Right heart failure (HCC)   Cholecystitis   Biliary disease with obstruction   AKI (acute kidney injury) (HCC)   Acute on chronic systolic CHF (congestive heart failure) (HCC)    RUQ pain likely acute on chronic cholecystitis.  -Patient readmitted with same issues 6/5, not a surgical candidate at the time due to myriad of cardiac issues .  -Abd US shows Gallstones and mild gallbladder wall thickening possibly reflecting cholecystitis. No pericholecystic fluid or positive sonographic Murphy's sign. Mild dilation of the common bile duct without evidence of commonduct stones.  -wbc climbed but liver enzymes improving -HIDA scan done and showed indeterminate results secondary to lack of gallbladder filling. -ERCP done on 06/11/16 by Dr. Madilyn Fireman, with sphincterotomy and placement of CBD stent. -Transaminases and total bilirubin improving after ERCP. -cont renal diet - for now if remains stable plan for elective chole in a few weeks - cont augmentin  Likely early sepsis -WBC fluctuating, is now much improved -Patient is  now on Augmentin.  Acute kidney injury on CKD stage IV baseline creatinine 2.6. - had HD again today -Was on diuretics at home, creatinine increase inconsistently, -Has a R AVF if needed for HD. -He is getting acidotic and the creatinine is worsening, nephrology consulted and following for HD    Atrial Fibrillation CHA2DS2-VASc score 3, on anticoagulation with Eliquis -Rate is controlled, home medications continued -Eliquis on hold. Started on heparin drip. Now HD patient so will start coumadin today per PKS.  Chronic systolic heart failure, last echocardiogram 6/6 with Severely dilated cavity, mild LVH, EF 10-15 % Dry weight 82 kg, current weight 82.2 . Appears compensated.  - Daily weights, strict I/O  CAD, Tn 0.01 EKG Shows paced rhythm , last cardiac catheterization with stent in 2015, patient is cardiac pain free at this time. Continue meds   History of Hypotension stable for now BP 104/55 mmHg  Pulse 75  IVF on hold in the setting of severe CHF /Afib with risk of volume overload  Continue to monitor   Type II Diabetes - Optimize.  OSA on CPAP/COPD, not on acute exacerbation, stable  Continue CPAP and O2  GERD, no acute symptoms: Continue PPI  History of Rectal Cancer, s/p chemo/ XRT No acute changes.   Elevated lipase - No abdominal pain.   DVT prophylaxis: SCDs now on heparin drip. Code Status: Full Code Family Communication:  Disposition Plan:  Diet: Diet Carb Modified Fluid consistency:: Thin; Room service appropriate?: Yes  Consultants:   Nephrology  GI   Antimicrobials:   Augmentin   Objective: Filed Vitals:   06/17/16 2154 06/17/16 2233 06/18/16 0612 06/18/16 1016  BP: 105/55 104/57 100/53 100/63  Pulse: 74 74 70 77  Temp: 98.4 F (36.9 C) 98.2 F (36.8 C) 97.6 F (36.4 C)   TempSrc: Oral Oral Oral   Resp: 16  17   Height:      Weight:   83.643 kg (184 lb 6.4 oz)   SpO2: 100% 100% 98%     Intake/Output Summary (Last  24 hours) at 06/18/16 1116 Last data filed at 06/18/16 0945  Gross per 24 hour  Intake 1088.58 ml  Output   3125 ml  Net -2036.42 ml   Filed Weights   06/17/16 0820 06/17/16 1120 06/18/16 0612  Weight: 86.6 kg (190 lb 14.7 oz) 83.3 kg (183 lb 10.3 oz) 83.643 kg (184 lb 6.4 oz)    Examination: General exam: Appears calm and comfortable  Respiratory system: Clear to auscultation. Respiratory effort normal. Cardiovascular system: S1 & S2 heard, RRR. No JVD, murmurs, rubs, gallops or clicks. No pedal edema. Gastrointestinal system: Abdomen is nondistended, soft and nontender. No organomegaly or masses felt. Normal bowel sounds heard. Central nervous system: Alert and oriented. No focal neurological deficits. Extremities: Symmetric 5 x 5 power. Skin: No rashes, lesions or ulcers Psychiatry: Judgement and insight appear normal. Mood & affect appropriate.   Data Reviewed: I have personally reviewed following labs and imaging studies  CBC:  Recent Labs Lab 06/14/16 0958 06/15/16 0210 06/16/16 0220 06/17/16 0241 06/18/16 0209  WBC 17.5* 15.0* 17.0* 20.0* 12.0*  NEUTROABS 16.0*  --   --   --   --   HGB 10.8* 10.9* 11.4* 11.0* 11.9*  HCT 31.0* 31.7* 33.0* 31.4* 34.1*  MCV 91.2 90.3 91.2 91.3 91.4  PLT 75* 75* 85* 118* 93*   Basic Metabolic Panel:  Recent Labs Lab 06/14/16 0958 06/15/16 0210 06/16/16 0220 06/17/16 0241 06/18/16 0209  NA 126* 124* 127* 127* 127*  K 4.9 5.3* 5.0 5.6* 4.9  CL 92* 90* 92* 93* 92*  CO2 17* 16* 18* 18* 21*  GLUCOSE 213* 185* 165* 142* 106*  BUN 106* 120* 100* 111* 63*  CREATININE 5.82* 6.07* 5.65* 6.42* 4.93*  CALCIUM 7.8* 8.5* 9.1 9.2 8.9   GFR: Estimated Creatinine Clearance: 16.2 mL/min (by C-G formula based on Cr of 4.93). Liver Function Tests:  Recent Labs Lab 06/14/16 0958 06/15/16 0210 06/16/16 0220 06/17/16 0241 06/18/16 0209  AST 266* 200* 134* 113* 86*  ALT 171* 150* 125* 117* 91*  ALKPHOS 168* 160* 156* 151* 133*    BILITOT 4.1* 4.1* 3.5* 3.2* 2.6*  PROT 7.3 7.2 7.5 7.5 6.9  ALBUMIN 2.0* 2.0* 2.1* 2.1* 1.9*   No results for input(s): LIPASE, AMYLASE in the last 168 hours. No results for input(s): AMMONIA in the last 168 hours. Coagulation Profile: No results for input(s): INR, PROTIME in the last 168 hours. Cardiac Enzymes: No results for input(s): CKTOTAL, CKMB, CKMBINDEX, TROPONINI in the last 168 hours. BNP (last 3 results) No results for input(s): PROBNP in the last 8760 hours. HbA1C: No results for input(s): HGBA1C in the last 72 hours. CBG:  Recent Labs Lab 06/17/16 0614 06/17/16 1227 06/17/16 1636 06/17/16 2230 06/18/16 0606  GLUCAP 122* 81 95 107* 120*   Lipid Profile: No results for input(s): CHOL, HDL, LDLCALC, TRIG, CHOLHDL, LDLDIRECT in the last 72 hours. Thyroid Function Tests: No results for input(s): TSH, T4TOTAL, FREET4, T3FREE, THYROIDAB in the last 72 hours. Anemia Panel: No results for input(s): VITAMINB12, FOLATE, FERRITIN, TIBC, IRON, RETICCTPCT in the last 72 hours. Urine analysis:  Component Value Date/Time   COLORURINE YELLOW 06/09/2016 1023   APPEARANCEUR CLEAR 06/09/2016 1023   LABSPEC 1.017 06/09/2016 1023   PHURINE 5.5 06/09/2016 1023   GLUCOSEU >1000* 06/09/2016 1023   HGBUR NEGATIVE 06/09/2016 1023   BILIRUBINUR NEGATIVE 06/09/2016 1023   KETONESUR NEGATIVE 06/09/2016 1023   PROTEINUR NEGATIVE 06/09/2016 1023   UROBILINOGEN 1.0 08/23/2015 0322   NITRITE NEGATIVE 06/09/2016 1023   LEUKOCYTESUR NEGATIVE 06/09/2016 1023   Sepsis Labs: '@LABRCNTIP'$ (procalcitonin:4,lacticidven:4)  ) Recent Results (from the past 240 hour(s))  Culture, blood (Routine X 2) w Reflex to ID Panel     Status: None   Collection Time: 06/11/16  7:25 AM  Result Value Ref Range Status   Specimen Description BLOOD LEFT HAND  Final   Special Requests BOTTLES DRAWN AEROBIC AND ANAEROBIC  5CC  Final   Culture NO GROWTH 5 DAYS  Final   Report Status 06/16/2016 FINAL  Final   Culture, blood (Routine X 2) w Reflex to ID Panel     Status: None   Collection Time: 06/11/16  7:29 AM  Result Value Ref Range Status   Specimen Description BLOOD LEFT HAND  Final   Special Requests BOTTLES DRAWN AEROBIC ONLY  5CC  Final   Culture NO GROWTH 5 DAYS  Final   Report Status 06/16/2016 FINAL  Final     Invalid input(s): PROCALCITONIN, [place]   Radiology Studies: No results found.      Scheduled Meds: . amoxicillin-clavulanate  1 tablet Oral QHS  . ferrous sulfate  325 mg Oral BID WC  . insulin aspart  0-15 Units Subcutaneous TID WC  . insulin glargine  15 Units Subcutaneous QHS  . metoprolol succinate  12.5 mg Oral BID  . pantoprazole  40 mg Oral Daily  . sodium chloride flush  3 mL Intravenous Q12H   Continuous Infusions: . heparin 1,200 Units/hr (06/17/16 2157)     LOS: 8 days    Time spent: 33 minutes    Mir Marry Guan, MD [facility] Pager [phone]  If 7PM-7AM, please contact night-coverage [email or web address] Password [facility] 06/18/2016, 11:16 AM

## 2016-06-18 NOTE — Discharge Instructions (Addendum)
°Information on my medicine - Coumadin®   (Warfarin) ° °This medication education was reviewed with me or my healthcare representative as part of my discharge preparation.  The pharmacist that spoke with me during my hospital stay was:  Wilson, Frank Rhea, RPH ° °Why was Coumadin prescribed for you? °Coumadin was prescribed for you because you have a blood clot or a medical condition that can cause an increased risk of forming blood clots. Blood clots can cause serious health problems by blocking the flow of blood to the heart, lung, or brain. Coumadin can prevent harmful blood clots from forming. °As a reminder your indication for Coumadin is:   Stroke Prevention Because Of Atrial Fibrillation ° °What test will check on my response to Coumadin? °While on Coumadin (warfarin) you will need to have an INR test regularly to ensure that your dose is keeping you in the desired range. The INR (international normalized ratio) number is calculated from the result of the laboratory test called prothrombin time (PT). ° °If an INR APPOINTMENT HAS NOT ALREADY BEEN MADE FOR YOU please schedule an appointment to have this lab work done by your health care provider within 7 days. °Your INR goal is usually a number between:  2 to 3 or your provider may give you a more narrow range like 2-2.5.  Ask your health care provider during an office visit what your goal INR is. ° °What  do you need to  know  About  COUMADIN? °Take Coumadin (warfarin) exactly as prescribed by your healthcare provider about the same time each day.  DO NOT stop taking without talking to the doctor who prescribed the medication.  Stopping without other blood clot prevention medication to take the place of Coumadin may increase your risk of developing a new clot or stroke.  Get refills before you run out. ° °What do you do if you miss a dose? °If you miss a dose, take it as soon as you remember on the same day then continue your regularly scheduled regimen the  next day.  Do not take two doses of Coumadin at the same time. ° °Important Safety Information °A possible side effect of Coumadin (Warfarin) is an increased risk of bleeding. You should call your healthcare provider right away if you experience any of the following: °  Bleeding from an injury or your nose that does not stop. °  Unusual colored urine (red or dark brown) or unusual colored stools (red or black). °  Unusual bruising for unknown reasons. °  A serious fall or if you hit your head (even if there is no bleeding). ° °Some foods or medicines interact with Coumadin® (warfarin) and might alter your response to warfarin. To help avoid this: °  Eat a balanced diet, maintaining a consistent amount of Vitamin K. °  Notify your provider about major diet changes you plan to make. °  Avoid alcohol or limit your intake to 1 drink for women and 2 drinks for men per day. °(1 drink is 5 oz. wine, 12 oz. beer, or 1.5 oz. liquor.) ° °Make sure that ANY health care provider who prescribes medication for you knows that you are taking Coumadin (warfarin).  Also make sure the healthcare provider who is monitoring your Coumadin knows when you have started a new medication including herbals and non-prescription products. ° °Coumadin® (Warfarin)  Major Drug Interactions  °Increased Warfarin Effect Decreased Warfarin Effect  °Alcohol (large quantities) °Antibiotics (esp. Septra/Bactrim, Flagyl, Cipro) °Amiodarone (Cordarone) °Aspirin (  ASA) °Cimetidine (Tagamet) °Megestrol (Megace) °NSAIDs (ibuprofen, naproxen, etc.) °Piroxicam (Feldene) °Propafenone (Rythmol SR) °Propranolol (Inderal) °Isoniazid (INH) °Posaconazole (Noxafil) Barbiturates (Phenobarbital) °Carbamazepine (Tegretol) °Chlordiazepoxide (Librium) °Cholestyramine (Questran) °Griseofulvin °Oral Contraceptives °Rifampin °Sucralfate (Carafate) °Vitamin K  ° °Coumadin® (Warfarin) Major Herbal Interactions  °Increased Warfarin Effect Decreased Warfarin Effect   °Garlic °Ginseng °Ginkgo biloba Coenzyme Q10 °Green tea °St. John’s wort   ° °Coumadin® (Warfarin) FOOD Interactions  °Eat a consistent number of servings per week of foods HIGH in Vitamin K °(1 serving = ½ cup)  °Collards (cooked, or boiled & drained) °Kale (cooked, or boiled & drained) °Mustard greens (cooked, or boiled & drained) °Parsley *serving size only = ¼ cup °Spinach (cooked, or boiled & drained) °Swiss chard (cooked, or boiled & drained) °Turnip greens (cooked, or boiled & drained)  °Eat a consistent number of servings per week of foods MEDIUM-HIGH in Vitamin K °(1 serving = 1 cup)  °Asparagus (cooked, or boiled & drained) °Broccoli (cooked, boiled & drained, or raw & chopped) °Brussel sprouts (cooked, or boiled & drained) *serving size only = ½ cup °Lettuce, raw (green leaf, endive, romaine) °Spinach, raw °Turnip greens, raw & chopped  ° °These websites have more information on Coumadin (warfarin):  www.coumadin.com; °www.ahrq.gov/consumer/coumadin.htm; ° ° ° °

## 2016-06-18 NOTE — Progress Notes (Signed)
CARDIAC REHAB PHASE I   Pt in recliner, recently returned from ambulating with PT. Completed CHF education with pt. Reviewed CHF booklet and zone tool, daily weights, sodium and fluid restrictions, carb counting, and phase 2 cardiac rehab. Pt verbalized understanding, receptive to education. Pt agrees to phase 2 cardiac rehab referral, will send to [place] per pt request. Pt in recliner, call bell within reach, watching coumadin video. Will follow as low priority.   [id number] Lenna Sciara, RN, BSN 06/18/2016 2:57 PM

## 2016-06-18 NOTE — Evaluation (Signed)
Physical Therapy Evaluation Patient Details Name: [name] MRN: [id number] DOB: 10-29-1956 Today's Date: 06/18/2016   History of Present Illness  60 y/o with brittle systolic HF. EF 20% and CKD IV. Admitted 06/09/16 with recurrent biliary colic found to have biliary pancreatitis. Course c/b worsening renal failure  Clinical Impression  Patient demonstrates deficits in functional mobility as indicated below. Will need continued skilled PT to address deficits and maximize function. Will see as indicated and progress     Follow Up Recommendations No PT follow up;Supervision - Intermittent    Equipment Recommendations  None recommended by PT    Recommendations for Other Services       Precautions / Restrictions Precautions Precautions: Fall Precaution Comments: blood thinners Restrictions Weight Bearing Restrictions: No      Mobility  Bed Mobility Overal bed mobility: Modified Independent             General bed mobility comments: increased time to perform, no physical assist required  Transfers Overall transfer level: Needs assistance Equipment used: None Transfers: Sit to/from Stand Sit to Stand: Min guard         General transfer comment: min guard for safety and stability, some lateral sway noted upon elevation  Ambulation/Gait Ambulation/Gait assistance: Min assist Ambulation Distance (Feet): 180 Feet Assistive device: None Gait Pattern/deviations: Step-through pattern;Antalgic;Narrow base of support Gait velocity: decreased Gait velocity interpretation: Below normal speed for age/gender General Gait Details: patient slow with gait, some instability noted with lateral drifts. min assist at times for stability.   Stairs            Wheelchair Mobility    Modified Rankin (Stroke Patients Only)       Balance Overall balance assessment: History of Falls (last fall approx 1 yr ago )                                            Pertinent Vitals/Pain      Home Living Family/patient expects to be discharged to:: Private residence Living Arrangements: Spouse/significant other Available Help at Discharge: Family;Available 24 hours/day Type of Home: Mobile home Home Access: Stairs to enter Entrance Stairs-Rails: Can reach both Entrance Stairs-Number of Steps: 2 Home Layout: One level Home Equipment: Walker - 2 wheels      Prior Function Level of Independence: Independent               Hand Dominance   Dominant Hand: Right    Extremity/Trunk Assessment   Upper Extremity Assessment: Overall WFL for tasks assessed           Lower Extremity Assessment: Generalized weakness         Communication   Communication: No difficulties  Cognition Arousal/Alertness: Awake/alert Behavior During Therapy: WFL for tasks assessed/performed Overall Cognitive Status: Within Functional Limits for tasks assessed                      General Comments      Exercises        Assessment/Plan    PT Assessment Patient needs continued PT services  PT Diagnosis Difficulty walking;Abnormality of gait;Generalized weakness   PT Problem List Decreased strength;Decreased activity tolerance;Decreased balance;Decreased mobility;Cardiopulmonary status limiting activity  PT Treatment Interventions DME instruction;Gait training;Stair training;Functional mobility training;Therapeutic activities;Therapeutic exercise;Balance training;Patient/family education   PT Goals (Current goals can be found in the Care Plan section)  Acute Rehab PT Goals Patient Stated Goal: to go home PT Goal Formulation: With patient Time For Goal Achievement: 07/02/16 Potential to Achieve Goals: Good    Frequency Min 3X/week   Barriers to discharge        Co-evaluation               End of Session Equipment Utilized During Treatment: Gait belt Activity Tolerance: Patient tolerated treatment well Patient left: in  chair;with call bell/phone within reach Nurse Communication: Mobility status         Time: [id number] PT Time Calculation (min) (ACUTE ONLY): 18 min   Charges:   PT Evaluation $PT Eval Moderate Complexity: 1 Procedure     PT G CodesDuncan [name] 06/22/2016, 3:10 PM [name], [place] DPT  [phone]

## 2016-06-18 NOTE — Progress Notes (Signed)
Patient ID: KENON PAIK, male   DOB: 1956-06-23, 60 y.o.   MRN: [id number] S: Feels well, no complaints O:BP 100/53 mmHg  Pulse 70  Temp(Src) 97.6 F (36.4 C) (Oral)  Resp 17  Ht 5\' 10"  (1.778 m)  Wt 83.643 kg (184 lb 6.4 oz)  BMI 26.46 kg/m2  SpO2 98%  Intake/Output Summary (Last 24 hours) at 06/18/16 0907 Last data filed at 06/18/16 0901  Gross per 24 hour  Intake 1088.58 ml  Output   3000 ml  Net -1911.42 ml   Intake/Output: I/O last 3 completed shifts: In: 968.6 [P.O.:820; I.V.:148.6] Out: [id number] [Urine:525; Other:3000]  Intake/Output this shift:  Total I/O In: 120 [P.O.:120] Out: 0  Weight change: 0.326 kg (11.5 oz) Gen:WD WN WM in NAd CVS:no rub Resp:cta [id number] Ext: trace edema, RUE AVF +T/B   Recent Labs Lab 06/13/16 0736 06/14/16 0257 06/14/16 0958 06/15/16 0210 06/16/16 0220 06/17/16 0241 06/18/16 0209  NA 125* 124* 126* 124* 127* 127* 127*  K 4.9 4.9 4.9 5.3* 5.0 5.6* 4.9  CL 89* 91* 92* 90* 92* 93* 92*  CO2 17* 16* 17* 16* 18* 18* 21*  GLUCOSE 341* 268* 213* 185* 165* 142* 106*  BUN 93* 100* 106* 120* 100* 111* 63*  CREATININE 5.47* 5.72* 5.82* 6.07* 5.65* 6.42* 4.93*  ALBUMIN 2.1* 2.0* 2.0* 2.0* 2.1* 2.1* 1.9*  CALCIUM 8.0* 7.4* 7.8* 8.5* 9.1 9.2 8.9  AST 225* 268* 266* 200* 134* 113* 86*  ALT 159* 171* 171* 150* 125* 117* 91*   Liver Function Tests:  Recent Labs Lab 06/16/16 0220 06/17/16 0241 06/18/16 0209  AST 134* 113* 86*  ALT 125* 117* 91*  ALKPHOS 156* 151* 133*  BILITOT 3.5* 3.2* 2.6*  PROT 7.5 7.5 6.9  ALBUMIN 2.1* 2.1* 1.9*   No results for input(s): LIPASE, AMYLASE in the last 168 hours. No results for input(s): AMMONIA in the last 168 hours. CBC:  Recent Labs Lab 06/14/16 0958 06/15/16 0210 06/16/16 0220 06/17/16 0241 06/18/16 0209  WBC 17.5* 15.0* 17.0* 20.0* 12.0*  NEUTROABS 16.0*  --   --   --   --   HGB 10.8* 10.9* 11.4* 11.0* 11.9*  HCT 31.0* 31.7* 33.0* 31.4* 34.1*  MCV 91.2 90.3 91.2 91.3 91.4   PLT 75* 75* 85* 118* 93*   Cardiac Enzymes: No results for input(s): CKTOTAL, CKMB, CKMBINDEX, TROPONINI in the last 168 hours. CBG:  Recent Labs Lab 06/17/16 0614 06/17/16 1227 06/17/16 1636 06/17/16 2230 06/18/16 0606  GLUCAP 122* 81 95 107* 120*    Iron Studies: No results for input(s): IRON, TIBC, TRANSFERRIN, FERRITIN in the last 72 hours. Studies/Results: No results found. Marland Kitchen amoxicillin-clavulanate  1 tablet Oral QHS  . ferrous sulfate  325 mg Oral BID WC  . insulin aspart  0-15 Units Subcutaneous TID WC  . insulin glargine  15 Units Subcutaneous QHS  . metoprolol succinate  12.5 mg Oral BID  . pantoprazole  40 mg Oral Daily  . sodium chloride flush  3 mL Intravenous Q12H    BMET    Component Value Date/Time   NA 127* 06/18/2016 0209   K 4.9 06/18/2016 0209   CL 92* 06/18/2016 0209   CO2 21* 06/18/2016 0209   GLUCOSE 106* 06/18/2016 0209   BUN 63* 06/18/2016 0209   CREATININE 4.93* 06/18/2016 0209   CREATININE 2.67* 03/18/2016 0917   CALCIUM 8.9 06/18/2016 0209   CALCIUM 8.6 08/26/2015 0428   GFRNONAA 12* 06/18/2016 0209   GFRAA 13* 06/18/2016 0209  CBC    Component Value Date/Time   WBC 12.0* 06/18/2016 0209   WBC 9.3 05/02/2012 0852   RBC 3.73* 06/18/2016 0209   RBC 3.03* 08/26/2015 0428   RBC 4.68 05/02/2012 0852   HGB 11.9* 06/18/2016 0209   HGB 14.6 05/02/2012 0852   HCT 34.1* 06/18/2016 0209   HCT 23.6* 04/24/2015 1116   HCT 43.6 05/02/2012 0852   PLT 93* 06/18/2016 0209   PLT 163 05/02/2012 0852   MCV 91.4 06/18/2016 0209   MCV 93.3 05/02/2012 0852   MCH 31.9 06/18/2016 0209   MCH 31.2 05/02/2012 0852   MCHC 34.9 06/18/2016 0209   MCHC 33.4 05/02/2012 0852   RDW 13.5 06/18/2016 0209   RDW 14.1 05/02/2012 0852   LYMPHSABS 0.8 06/14/2016 0958   LYMPHSABS 1.2 05/02/2012 0852   MONOABS 0.6 06/14/2016 0958   MONOABS 0.6 05/02/2012 0852   EOSABS 0.0 06/14/2016 0958   EOSABS 0.0 05/02/2012 0852   BASOSABS 0.0 06/14/2016 0958    BASOSABS 0.0 05/02/2012 0852     Assessment/Plan:  1. AKI/CKD stage 4 (baseline Scr 2-3) in setting of acute illness, likely ischemic ATN 1. HD initiated on 06/15/16, continue with TTS 2. CLIP process initiated for [facility] on TTS schedule (plan as acute for now) 2. Systolic CHF due to ischemic cardiomyopathy 3. Cholecystitis- s/p ERCP 06/11/16 4. Vascular access- mature AVF placed and successfully cannulated 5. Chronic A fib on hep gtt awaiting oral anticoagulation to reach therapeutic levels  Donetta Potts

## 2016-06-18 NOTE — Progress Notes (Signed)
ANTICOAGULATION CONSULT NOTE - Follow Up Consult  Pharmacy Consult for heparin Indication: atrial fibrillation  No Known Allergies  Patient Measurements: Height: 5\' 10"  (177.8 cm) Weight: 184 lb 6.4 oz (83.643 kg) (a scale) IBW/kg (Calculated) : 73 Heparin Dosing Weight: 82 kg  Labs:  Recent Labs  06/16/16 0220  06/17/16 0241 06/17/16 1554 06/17/16 1940 06/18/16 0209 06/18/16 0412  HGB 11.4*  --  11.0*  --   --  11.9*  --   HCT 33.0*  --  31.4*  --   --  34.1*  --   PLT 85*  --  118*  --   --  93*  --   HEPARINUNFRC 0.18*  < > 0.24* 1.62* 0.27*  --  0.37  CREATININE 5.65*  --  6.42*  --   --  4.93*  --   < > = values in this interval not displayed.  Estimated Creatinine Clearance: 16.2 mL/min (by C-G formula based on Cr of 4.93).   Medications:  Infusions:  . heparin 1,200 Units/hr (06/17/16 2157)    Assessment: 59yom admitted with acute cholecystis s/p ERCP and stent placed.PTA apixaban held on admit for possible surgery and remains on hold until patient more stable. Last apixaban dose 7/4 PTA. Pt on heparin bridge for stroke prevention until apixaban restarted.  Heparin level now at goal this morning. No issues noted overnight.   Goal of Therapy:  Heparin level 0.3-0.5 units/ml Monitor platelets by anticoagulation protocol: Yes   Plan:  Continue heparin gtt at 1200 units/hr Follow up AM heparin level, CBC  Thank you Erin Hearing PharmD., BCPS Clinical Pharmacist Pager [phone] 06/18/2016 7:59 AM

## 2016-06-19 DIAGNOSIS — Z85048 Personal history of other malignant neoplasm of rectum, rectosigmoid junction, and anus: Secondary | ICD-10-CM

## 2016-06-19 DIAGNOSIS — I509 Heart failure, unspecified: Secondary | ICD-10-CM

## 2016-06-19 DIAGNOSIS — K219 Gastro-esophageal reflux disease without esophagitis: Secondary | ICD-10-CM

## 2016-06-19 DIAGNOSIS — R740 Nonspecific elevation of levels of transaminase and lactic acid dehydrogenase [LDH]: Secondary | ICD-10-CM

## 2016-06-19 DIAGNOSIS — G4733 Obstructive sleep apnea (adult) (pediatric): Secondary | ICD-10-CM

## 2016-06-19 DIAGNOSIS — E1122 Type 2 diabetes mellitus with diabetic chronic kidney disease: Secondary | ICD-10-CM

## 2016-06-19 DIAGNOSIS — K811 Chronic cholecystitis: Secondary | ICD-10-CM

## 2016-06-19 DIAGNOSIS — Z794 Long term (current) use of insulin: Secondary | ICD-10-CM

## 2016-06-19 DIAGNOSIS — N184 Chronic kidney disease, stage 4 (severe): Secondary | ICD-10-CM

## 2016-06-19 DIAGNOSIS — D638 Anemia in other chronic diseases classified elsewhere: Secondary | ICD-10-CM

## 2016-06-19 DIAGNOSIS — E1165 Type 2 diabetes mellitus with hyperglycemia: Secondary | ICD-10-CM

## 2016-06-19 LAB — CBC
HEMATOCRIT: 29.3 % — AB (ref 39.0–52.0)
HEMOGLOBIN: 10.1 g/dL — AB (ref 13.0–17.0)
MCH: 31.9 pg (ref 26.0–34.0)
MCHC: 34.5 g/dL (ref 30.0–36.0)
MCV: 92.4 fL (ref 78.0–100.0)
Platelets: 77 10*3/uL — ABNORMAL LOW (ref 150–400)
RBC: 3.17 MIL/uL — ABNORMAL LOW (ref 4.22–5.81)
RDW: 13.7 % (ref 11.5–15.5)
WBC: 8.5 10*3/uL (ref 4.0–10.5)

## 2016-06-19 LAB — COMPREHENSIVE METABOLIC PANEL
ALK PHOS: 137 U/L — AB (ref 38–126)
ALT: 61 U/L (ref 17–63)
ANION GAP: 15 (ref 5–15)
AST: 47 U/L — AB (ref 15–41)
Albumin: 2 g/dL — ABNORMAL LOW (ref 3.5–5.0)
BUN: 77 mg/dL — ABNORMAL HIGH (ref 6–20)
CO2: 17 mmol/L — AB (ref 22–32)
Calcium: 8.8 mg/dL — ABNORMAL LOW (ref 8.9–10.3)
Chloride: 96 mmol/L — ABNORMAL LOW (ref 101–111)
Creatinine, Ser: 5.86 mg/dL — ABNORMAL HIGH (ref 0.61–1.24)
GFR calc Af Amer: 11 mL/min — ABNORMAL LOW (ref >60)
GFR calc non Af Amer: 9 mL/min — ABNORMAL LOW (ref >60)
GLUCOSE: 122 mg/dL — AB (ref 65–99)
POTASSIUM: 4.5 mmol/L (ref 3.5–5.1)
SODIUM: 128 mmol/L — AB (ref 135–145)
Total Bilirubin: 2.1 mg/dL — ABNORMAL HIGH (ref 0.3–1.2)
Total Protein: 6.9 g/dL (ref 6.5–8.1)

## 2016-06-19 LAB — HEPARIN LEVEL (UNFRACTIONATED): Heparin Unfractionated: 0.64 IU/mL (ref 0.30–0.70)

## 2016-06-19 LAB — GLUCOSE, CAPILLARY
GLUCOSE-CAPILLARY: 126 mg/dL — AB (ref 65–99)
GLUCOSE-CAPILLARY: 121 mg/dL — AB (ref 65–99)
GLUCOSE-CAPILLARY: 118 mg/dL — AB (ref 65–99)
Glucose-Capillary: 148 mg/dL — ABNORMAL HIGH (ref 65–99)

## 2016-06-19 LAB — PROTIME-INR
INR: 1.46 (ref 0.00–1.49)
Prothrombin Time: 17.8 seconds — ABNORMAL HIGH (ref 11.6–15.2)

## 2016-06-19 MED ORDER — WARFARIN SODIUM 5 MG PO TABS
5.0000 mg | ORAL_TABLET | Freq: Once | ORAL | Status: AC
  Administered 2016-06-19: 5 mg via ORAL
  Filled 2016-06-19: qty 1

## 2016-06-19 MED ORDER — HEPARIN SODIUM (PORCINE) 1000 UNIT/ML DIALYSIS: 20.0000 [IU]/kg | INTRAMUSCULAR | Status: DC | PRN

## 2016-06-19 NOTE — Progress Notes (Addendum)
ANTICOAGULATION CONSULT NOTE - Follow Up Consult  Pharmacy Consult for Coumadin , continue heparin bridge Indication: atrial fibrillation  No Known Allergies  Patient Measurements: Height: 5\' 10"  (177.8 cm) Weight: 183 lb 9.6 oz (83.28 kg) IBW/kg (Calculated) : 73 Heparin Dosing Weight: 82 kg  Labs:  Recent Labs  06/17/16 0241  06/17/16 1940 06/18/16 0209 06/18/16 0412 06/19/16 0745  HGB 11.0*  --   --  11.9*  --  10.1*  HCT 31.4*  --   --  34.1*  --  29.3*  PLT 118*  --   --  93*  --  77*  LABPROT  --   --   --   --   --  17.8*  INR  --   --   --   --   --  1.46  HEPARINUNFRC 0.24*  < > 0.27*  --  0.37 0.64  CREATININE 6.42*  --   --  4.93*  --  5.86*  < > = values in this interval not displayed.  Estimated Creatinine Clearance: 13.7 mL/min (by C-G formula based on Cr of 5.86).   Past Medical History  Diagnosis Date  . Essential hypertension, benign   . CAD (coronary artery disease)     a. BMS to LAD 2001 at [facility] b. PTCA/atherectomy ramus and BMS to LAD 2009  . Chronic systolic heart failure ([place])   . GERD (gastroesophageal reflux disease)   . Paroxysmal atrial fibrillation (HCC)     a. on amiodarone, digoxin and Eliquis  . TIA (transient ischemic attack)   . HLD (hyperlipidemia)   . Orthostatic hypotension   . Dizziness     a. chronic. Admission for this 07/18/2014  . Ischemic cardiomyopathy     EF 18% by nuclear study 2016, multiple myocardial infarctions in past    . Obesity   . Hematuria   . History of blood transfusion     "I've had 2 units so far this year" (09/27/2015)  . Anemia   . SAH (subarachnoid hemorrhage) ([place])     post-traumatic (fall) [facility] 12/2014  . PONV (postoperative nausea and vomiting)   . CHF (congestive heart failure) ([place])   . Myocardial infarction ([place]) 2001  . HCAP (healthcare-associated pneumonia) 07/21/2015  . OSA on CPAP   . Chronic kidney disease, stage IV (severe) ([place])   . DM type 2, uncontrolled, with renal complications  ([place])   . Colostomy in place [facility])   . Adenocarcinoma of rectum ([place])     a. 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 Prostate cancer ([place])     a. s/p seed implants with chemo and radiation  . Cholelithiasis 06/2015      Medications:  Infusions:  . heparin 1,200 Units/hr (06/19/16 0048)    Assessment: 59yom admitted with acute cholecystis s/p ERCP and stent placed.PTA apixaban held on admit for possible surgery and remains on hold until patient more stable. Last apixaban dose 7/4 PTA. Pt on heparin bridge for stroke prevention until oral anticoagulation restarted. Patient with AKI/CKD stage 4 in setting of acute illness.  Hemodialysis initiated 06/15/16. Dr. Hollice Gong is switching oral AC from Apixaban (pta; Held inpatient) to Coumadin due to CKD/now on hemodialysis.  Last apixaban dose taken PTA on 7/4 pm due to originally held due to potential surgery.  -He has a history of severe ICM and rectal cancer status post partial colectomy and colostomy. AST/ALT: 268/171>200/150> 134/125>113/117>86/91, total bili  decreased to 91, acute cholecystis s/p ERCP. To continue on IV heparin drip bridging  to therapeutic coumadin. Heparin level was at goal this morning.  Hgb low/stable at 11.9,  PLTC low/stable 93 today. INR 1.46 today  Goal of Therapy:  Heparin level: 0.3-0.7 Monitor platelets by anticoagulation protocol: Yes   Plan:   Continue heparin gtt at 1200 units/hr as previously noted. Coumadin 5mg  PO x1 Follow up AM heparin level, CBC and INR  Onnie Boer, PharmD Pager: [phone] 06/19/2016 11:00 AM

## 2016-06-19 NOTE — Progress Notes (Signed)
Progress Note    [name]  [id number] DOB: 07/15/1956  DOA: 06/09/2016 PCP: Glo Herring., MD    Brief Narrative:   [name] is an 60 y.o. male with a PMH of CAD, severe ischemic cardiomyopathy/CHF (EF 10-15% by echo earlier this year, he has an ICD, followed by Dr. Haroldine Laws), atrial fibrillatin (now V paced, CHADS-Vasc score of 5, anticoagulated with Eliquis), CKD 4, DM, SAH, and colostomy S/P treatment for rectal cancer and chronic cholecystitis, hospitalized from 05/09/16-05/12/16 with conservative management recommended by surgery given his significant cardiac risk factors. No drain was placed at that time and a HIDA scan was declined by the patient. The patient was readmitted 06/09/16 with increased right upper quadrant pain associated with nausea and vomiting. He was placed on Zosyn and surgery/GI consulted for assistance with management. Underwent ERCP/stent 06/11/16  Assessment/Plan:   Principal Problem:   Chronic cholecystitis/Gallbladder polyp/transaminitis/biliary disease with obstruction Underwent ERCP with sphincterotomy and stent placement 06/11/16 with plans to consider an elective cholecystectomy in a few weeks. Continue Augmentin. WBC and LFTs continued to trend down. Blood cultures were negative.  Active Problems:   Thrombocytopenia Monitor. No signs of bleeding.    Hyponatremia Likely secondary to CHF physiology.    Acute kidney injury in the setting of stage IV chronic kidney disease Patient's deterioration in renal function was felt to be secondary to ischemic ATN. HD initiated 06/15/16. CLIP process initiated for [facility] on TTS schedule (plan as acute for now).    History of rectal cancer Status post surgical resection and colostomy placement 2008. He has follow up with Dr Benay Spice. Recent CT (05/02/16) with no evidence of recurrence.    Ischemic cardiomyopathy-EF 35% 04/24/15 echo / Chronic systolic heart failure (HCC)/right heart  failure History of brittle systolic HF. Echo 05/11/16 with LVEF 10-15%, mild AI, mild MR, normal RV. Volume status controlled with HD. Continue metoprolol.    CAD with LCX disease and stents to LAD s/p NSTEMI in 04/20/16.    Paroxysmal atrial fibrillation (HCC) CHADS2VASC score of 5. S/p AV node ablation. V-paced.     DM type 2, uncontrolled, with renal complications (HCC)  Currently being managed with 15 units of Lantus and moderate scale SSI. CBGs 120-210.    GERD (gastroesophageal reflux disease) Continue PPI.    OSA on CPAP    Hyperlipidemia Lipitor currently on hold pending resolution of transaminitis.    Anemia of chronic disease Hemoglobin stable.  Family Communication/Anticipated D/C date and plan/Code Status   DVT prophylaxis: Heparin/Coumadin ordered. Code Status: Full Code.  Family Communication: No family at the bedside. Disposition Plan: Home in several days if stable.   Medical Consultants:    Cardiology  Gastroenterology  Surgery  Nephrology   Procedures:   ERCP with sphincterotomy/stent 06/11/16  Anti-Infectives:   Zosyn 06/09/16 --->06/15/16 Vancomycin 06/10/16---> 06/15/16 Augmentin 06/15/16--->  Subjective:    [name] is somewhat lethargic, receiving hemodialysis. Denies abdominal pain, nausea or vomiting. Says his appetite is improving.  Objective:    Filed Vitals:   06/18/16 1016 06/18/16 1202 06/18/16 2233 06/19/16 0535  BP: 100/63 109/58 97/51 107/65  Pulse: 77 74 76 72  Temp:  97.6 F (36.4 C) 97.7 F (36.5 C) 97.5 F (36.4 C)  TempSrc:  Oral Oral Oral  Resp:   18 18  Height:      Weight:    83.28 kg (183 lb 9.6 oz)  SpO2:  100% 100% 98%    Intake/Output Summary (  Last 24 hours) at 06/19/16 0810 Last data filed at 06/19/16 0600  Gross per 24 hour  Intake    768 ml  Output    695 ml  Net     73 ml   Filed Weights   06/17/16 1120 06/18/16 0612 06/19/16 0535  Weight: 83.3 kg (183 lb 10.3 oz) 83.643 kg (184 lb 6.4  oz) 83.28 kg (183 lb 9.6 oz)    Exam: General exam: Appears calm and comfortable.  Respiratory system: Clear to auscultation. Respiratory effort normal. Cardiovascular system: S1 & S2 heard, RRR. No JVD,  rubs, gallops or clicks. No murmurs. Gastrointestinal system: Abdomen is nondistended, soft and nontender. No organomegaly or masses felt. Normal bowel sounds heard. Central nervous system: Lethargic. No focal neurological deficits. Extremities: No clubbing, edema, or cyanosis. Skin: Jaundiced with scleral icterus. Psychiatry: Judgement and insight appear normal. Mood & affect appropriate.   Data Reviewed:   I have personally reviewed following labs and imaging studies:  Labs: Basic Metabolic Panel:  Recent Labs Lab 06/14/16 0958 06/15/16 0210 06/16/16 0220 06/17/16 0241 06/18/16 0209  NA 126* 124* 127* 127* 127*  K 4.9 5.3* 5.0 5.6* 4.9  CL 92* 90* 92* 93* 92*  CO2 17* 16* 18* 18* 21*  GLUCOSE 213* 185* 165* 142* 106*  BUN 106* 120* 100* 111* 63*  CREATININE 5.82* 6.07* 5.65* 6.42* 4.93*  CALCIUM 7.8* 8.5* 9.1 9.2 8.9   GFR Estimated Creatinine Clearance: 16.2 mL/min (by C-G formula based on Cr of 4.93). Liver Function Tests:  Recent Labs Lab 06/14/16 0958 06/15/16 0210 06/16/16 0220 06/17/16 0241 06/18/16 0209  AST 266* 200* 134* 113* 86*  ALT 171* 150* 125* 117* 91*  ALKPHOS 168* 160* 156* 151* 133*  BILITOT 4.1* 4.1* 3.5* 3.2* 2.6*  PROT 7.3 7.2 7.5 7.5 6.9  ALBUMIN 2.0* 2.0* 2.1* 2.1* 1.9*    CBC:  Recent Labs Lab 06/14/16 0958 06/15/16 0210 06/16/16 0220 06/17/16 0241 06/18/16 0209  WBC 17.5* 15.0* 17.0* 20.0* 12.0*  NEUTROABS 16.0*  --   --   --   --   HGB 10.8* 10.9* 11.4* 11.0* 11.9*  HCT 31.0* 31.7* 33.0* 31.4* 34.1*  MCV 91.2 90.3 91.2 91.3 91.4  PLT 75* 75* 85* 118* 93*   CBG:  Recent Labs Lab 06/18/16 0606 06/18/16 1201 06/18/16 1716 06/18/16 2128 06/19/16 0609  GLUCAP 120* 146* 154* 210* 148*   Microbiology Recent  Results (from the past 240 hour(s))  Culture, blood (Routine X 2) w Reflex to ID Panel     Status: None   Collection Time: 06/11/16  7:25 AM  Result Value Ref Range Status   Specimen Description BLOOD LEFT HAND  Final   Special Requests BOTTLES DRAWN AEROBIC AND ANAEROBIC  5CC  Final   Culture NO GROWTH 5 DAYS  Final   Report Status 06/16/2016 FINAL  Final  Culture, blood (Routine X 2) w Reflex to ID Panel     Status: None   Collection Time: 06/11/16  7:29 AM  Result Value Ref Range Status   Specimen Description BLOOD LEFT HAND  Final   Special Requests BOTTLES DRAWN AEROBIC ONLY  5CC  Final   Culture NO GROWTH 5 DAYS  Final   Report Status 06/16/2016 FINAL  Final    Radiology: No results found.  Medications:   . amoxicillin-clavulanate  1 tablet Oral QHS  . ferrous sulfate  325 mg Oral BID WC  . insulin aspart  0-15 Units Subcutaneous TID WC  .  insulin glargine  15 Units Subcutaneous QHS  . metoprolol succinate  12.5 mg Oral BID  . pantoprazole  40 mg Oral Daily  . sodium chloride flush  3 mL Intravenous Q12H  . Warfarin - Pharmacist Dosing Inpatient   Does not apply q1800   Continuous Infusions: . heparin 1,200 Units/hr (06/19/16 0048)    Time spent: 35 minutes.  The patient is medically complex with multiple co-morbidities and is at high risk for clinical deterioration and requires high complexity decision making.    LOS: 9 days   [facility] Pager [phone]. If unable to reach me by pager, please call my cell phone at [phone].  *Please refer to [email or web address], password [email or web address] to get updated schedule on who will round on this patient, as hospitalists switch teams weekly. If 7PM-7AM, please contact night-coverage at [email or web address], password [email or web address] for any overnight needs.  06/19/2016, 8:10 AM

## 2016-06-19 NOTE — Progress Notes (Signed)
Patient ID: [name], male   DOB: 05-10-1956, 60 y.o.   MRN: [id number] S:reports bleeding from left elbow O:BP 100/56 mmHg  Pulse 73  Temp(Src) 97.4 F (36.3 C) (Oral)  Resp 18  Ht 5\' 10"  (1.778 m)  Wt 83.28 kg (183 lb 9.6 oz)  BMI 26.34 kg/m2  SpO2 100%  Intake/Output Summary (Last 24 hours) at 06/19/16 1352 Last data filed at 06/19/16 0847  Gross per 24 hour  Intake    768 ml  Output    470 ml  Net    298 ml   Intake/Output: I/O last 3 completed shifts: In: 1386.1 [P.O.:960; I.V.:426.1] Out: 695 [Urine:695]  Intake/Output this shift:  Total I/O In: 120 [P.O.:120] Out: -  Weight change: -3.319 kg (-7 lb 5.1 oz) Gen:wd wn wm in nad CVS:no rub Resp:cta [id number] Ext: bleeding from left elbow (soaking dressing)   Recent Labs Lab 06/14/16 0257 06/14/16 0958 06/15/16 0210 06/16/16 0220 06/17/16 0241 06/18/16 0209 06/19/16 0745  NA 124* 126* 124* 127* 127* 127* 128*  K 4.9 4.9 5.3* 5.0 5.6* 4.9 4.5  CL 91* 92* 90* 92* 93* 92* 96*  CO2 16* 17* 16* 18* 18* 21* 17*  GLUCOSE 268* 213* 185* 165* 142* 106* 122*  BUN 100* 106* 120* 100* 111* 63* 77*  CREATININE 5.72* 5.82* 6.07* 5.65* 6.42* 4.93* 5.86*  ALBUMIN 2.0* 2.0* 2.0* 2.1* 2.1* 1.9* 2.0*  CALCIUM 7.4* 7.8* 8.5* 9.1 9.2 8.9 8.8*  AST 268* 266* 200* 134* 113* 86* 47*  ALT 171* 171* 150* 125* 117* 91* 61   Liver Function Tests:  Recent Labs Lab 06/17/16 0241 06/18/16 0209 06/19/16 0745  AST 113* 86* 47*  ALT 117* 91* 61  ALKPHOS 151* 133* 137*  BILITOT 3.2* 2.6* 2.1*  PROT 7.5 6.9 6.9  ALBUMIN 2.1* 1.9* 2.0*   No results for input(s): LIPASE, AMYLASE in the last 168 hours. No results for input(s): AMMONIA in the last 168 hours. CBC:  Recent Labs Lab 06/14/16 0958 06/15/16 0210 06/16/16 0220 06/17/16 0241 06/18/16 0209 06/19/16 0745  WBC 17.5* 15.0* 17.0* 20.0* 12.0* 8.5  NEUTROABS 16.0*  --   --   --   --   --   HGB 10.8* 10.9* 11.4* 11.0* 11.9* 10.1*  HCT 31.0* 31.7* 33.0* 31.4*  34.1* 29.3*  MCV 91.2 90.3 91.2 91.3 91.4 92.4  PLT 75* 75* 85* 118* 93* 77*   Cardiac Enzymes: No results for input(s): CKTOTAL, CKMB, CKMBINDEX, TROPONINI in the last 168 hours. CBG:  Recent Labs Lab 06/18/16 1201 06/18/16 1716 06/18/16 2128 06/19/16 0609 06/19/16 1145  GLUCAP 146* 154* 210* 148* 126*    Iron Studies: No results for input(s): IRON, TIBC, TRANSFERRIN, FERRITIN in the last 72 hours. Studies/Results: No results found. Marland Kitchen amoxicillin-clavulanate  1 tablet Oral QHS  . ferrous sulfate  325 mg Oral BID WC  . insulin aspart  0-15 Units Subcutaneous TID WC  . insulin glargine  15 Units Subcutaneous QHS  . metoprolol succinate  12.5 mg Oral BID  . pantoprazole  40 mg Oral Daily  . sodium chloride flush  3 mL Intravenous Q12H  . warfarin  5 mg Oral ONCE-1800  . Warfarin - Pharmacist Dosing Inpatient   Does not apply q1800    BMET    Component Value Date/Time   NA 128* 06/19/2016 0745   K 4.5 06/19/2016 0745   CL 96* 06/19/2016 0745   CO2 17* 06/19/2016 0745   GLUCOSE 122* 06/19/2016 0745  BUN 77* 06/19/2016 0745   CREATININE 5.86* 06/19/2016 0745   CREATININE 2.67* 03/18/2016 0917   CALCIUM 8.8* 06/19/2016 0745   CALCIUM 8.6 08/26/2015 0428   GFRNONAA 9* 06/19/2016 0745   GFRAA 11* 06/19/2016 0745   CBC    Component Value Date/Time   WBC 8.5 06/19/2016 0745   WBC 9.3 05/02/2012 0852   RBC 3.17* 06/19/2016 0745   RBC 3.03* 08/26/2015 0428   RBC 4.68 05/02/2012 0852   HGB 10.1* 06/19/2016 0745   HGB 14.6 05/02/2012 0852   HCT 29.3* 06/19/2016 0745   HCT 23.6* 04/24/2015 1116   HCT 43.6 05/02/2012 0852   PLT 77* 06/19/2016 0745   PLT 163 05/02/2012 0852   MCV 92.4 06/19/2016 0745   MCV 93.3 05/02/2012 0852   MCH 31.9 06/19/2016 0745   MCH 31.2 05/02/2012 0852   MCHC 34.5 06/19/2016 0745   MCHC 33.4 05/02/2012 0852   RDW 13.7 06/19/2016 0745   RDW 14.1 05/02/2012 0852   LYMPHSABS 0.8 06/14/2016 0958   LYMPHSABS 1.2 05/02/2012 0852   MONOABS  0.6 06/14/2016 0958   MONOABS 0.6 05/02/2012 0852   EOSABS 0.0 06/14/2016 0958   EOSABS 0.0 05/02/2012 0852   BASOSABS 0.0 06/14/2016 0958   BASOSABS 0.0 05/02/2012 0852     Assessment/Plan:  1. AKI/CKD stage 4 (baseline Scr 2-3) in setting of acute illness, likely ischemic ATN 1. HD initiated on 06/15/16, continue with TTS 2. Set up for [facility] on TTS schedule second shift (plan as acute for now) 2. Systolic CHF due to ischemic cardiomyopathy 3. Cholecystitis- s/p ERCP 06/11/16 4. Vascular access- mature AVF placed and successfully cannulated 5. Chronic A fib on hep gtt awaiting oral anticoagulation to reach therapeutic levels  Donetta Potts

## 2016-06-19 NOTE — Progress Notes (Signed)
06/19/2016 11:25 AM [name] has been accepted at the [facility] on a Tuesday, Thursday and Saturday 2nd shift schedule. The center can begin treatment on Tuesday July 18th at 11:15AM. Thank you. Gordy Savers

## 2016-06-20 DIAGNOSIS — K824 Cholesterolosis of gallbladder: Secondary | ICD-10-CM

## 2016-06-20 DIAGNOSIS — K819 Cholecystitis, unspecified: Secondary | ICD-10-CM

## 2016-06-20 LAB — GLUCOSE, CAPILLARY
GLUCOSE-CAPILLARY: 163 mg/dL — AB (ref 65–99)
GLUCOSE-CAPILLARY: 157 mg/dL — AB (ref 65–99)
Glucose-Capillary: 164 mg/dL — ABNORMAL HIGH (ref 65–99)
Glucose-Capillary: 149 mg/dL — ABNORMAL HIGH (ref 65–99)

## 2016-06-20 LAB — COMPREHENSIVE METABOLIC PANEL
ALK PHOS: 142 U/L — AB (ref 38–126)
ALT: 52 U/L (ref 17–63)
ANION GAP: 11 (ref 5–15)
AST: 37 U/L (ref 15–41)
Albumin: 1.9 g/dL — ABNORMAL LOW (ref 3.5–5.0)
BILIRUBIN TOTAL: 2 mg/dL — AB (ref 0.3–1.2)
BUN: 43 mg/dL — ABNORMAL HIGH (ref 6–20)
CALCIUM: 8.6 mg/dL — AB (ref 8.9–10.3)
CO2: 24 mmol/L (ref 22–32)
Chloride: 98 mmol/L — ABNORMAL LOW (ref 101–111)
Creatinine, Ser: 4.29 mg/dL — ABNORMAL HIGH (ref 0.61–1.24)
GFR calc Af Amer: 16 mL/min — ABNORMAL LOW (ref >60)
GFR, EST NON AFRICAN AMERICAN: 14 mL/min — AB (ref >60)
GLUCOSE: 149 mg/dL — AB (ref 65–99)
Potassium: 3.8 mmol/L (ref 3.5–5.1)
Sodium: 133 mmol/L — ABNORMAL LOW (ref 135–145)
TOTAL PROTEIN: 6.9 g/dL (ref 6.5–8.1)

## 2016-06-20 LAB — PROTIME-INR
INR: 2.25 — ABNORMAL HIGH (ref 0.00–1.49)
Prothrombin Time: 24.7 seconds — ABNORMAL HIGH (ref 11.6–15.2)

## 2016-06-20 LAB — CBC
HCT: 29.4 % — ABNORMAL LOW (ref 39.0–52.0)
Hemoglobin: 10.2 g/dL — ABNORMAL LOW (ref 13.0–17.0)
MCH: 32 pg (ref 26.0–34.0)
MCHC: 34.7 g/dL (ref 30.0–36.0)
MCV: 92.2 fL (ref 78.0–100.0)
PLATELETS: 83 10*3/uL — AB (ref 150–400)
RBC: 3.19 MIL/uL — ABNORMAL LOW (ref 4.22–5.81)
RDW: 13.7 % (ref 11.5–15.5)
WBC: 8.1 10*3/uL (ref 4.0–10.5)

## 2016-06-20 LAB — HEPARIN LEVEL (UNFRACTIONATED): Heparin Unfractionated: 0.61 IU/mL (ref 0.30–0.70)

## 2016-06-20 MED ORDER — WARFARIN SODIUM 2 MG PO TABS
1.0000 mg | ORAL_TABLET | Freq: Once | ORAL | Status: AC
  Administered 2016-06-20: 1 mg via ORAL
  Filled 2016-06-20: qty 0.5

## 2016-06-20 NOTE — Progress Notes (Signed)
Progress Note    [name]  [id number] DOB: [birth year]  DOA: 06/09/2016 PCP: Glo Herring., MD    Brief Narrative:   [name] is an 60 y.o. male with a PMH of CAD, severe ischemic cardiomyopathy/CHF (EF 10-15% by echo earlier this year, he has an ICD, followed by Dr. Haroldine Laws), atrial fibrillatin (now V paced, CHADS-Vasc score of 5, anticoagulated with Eliquis), CKD 4, DM, SAH, and colostomy S/P treatment for rectal cancer and chronic cholecystitis, hospitalized from 05/09/16-05/12/16 with conservative management recommended by surgery given his significant cardiac risk factors. No drain was placed at that time and a HIDA scan was declined by the patient. The patient was readmitted 06/09/16 with increased right upper quadrant pain associated with nausea and vomiting. He was placed on Zosyn and surgery/GI consulted for assistance with management. Underwent ERCP/stent 06/11/16  Assessment/Plan:   Principal Problem:   Chronic cholecystitis/Gallbladder polyp/transaminitis/biliary disease with obstruction Underwent ERCP with sphincterotomy and stent placement 06/11/16 with plans to consider an elective cholecystectomy in a few weeks. Continue Augmentin. WBC and LFTs continued to trend down. Blood cultures were negative.  Active Problems:   Thrombocytopenia Monitor. No signs of bleeding.    Hyponatremia, Improving Likely secondary to CHF physiology.    Acute kidney injury in the setting of stage IV chronic kidney disease Patient's deterioration in renal function was felt to be secondary to ischemic ATN. HD initiated 06/15/16. CLIP process initiated for [facility] on TTS schedule (plan as acute for now).    History of rectal cancer Status post surgical resection and colostomy placement 2008. He has follow up with Dr Benay Spice. Recent CT (05/02/16) with no evidence of recurrence.    Ischemic cardiomyopathy-EF 35% 04/24/15 echo / Chronic systolic heart failure (HCC)/right heart  failure History of brittle systolic HF. Echo 05/11/16 with LVEF 10-15%, mild AI, mild MR, normal RV. Volume status controlled with HD. Continue metoprolol.    CAD with LCX disease and stents to LAD s/p NSTEMI in 04/20/16.    Paroxysmal atrial fibrillation (HCC) CHADS2VASC score of 5. S/p AV node ablation. V-paced. Anticoagulated with heparin/Coumadin. INR now therapeutic. Can stop heparin 06/21/16 if INR remains therapeutic.    DM type 2, uncontrolled, with renal complications (HCC)  Currently being managed with 15 units of Lantus and moderate scale SSI. CBGs 118-163.    GERD (gastroesophageal reflux disease) Continue PPI.    OSA on CPAP    Hyperlipidemia Lipitor currently on hold pending resolution of transaminitis.    Anemia of chronic disease Hemoglobin stable.  Family Communication/Anticipated D/C date and plan/Code Status   DVT prophylaxis: Heparin/Coumadin ordered. Code Status: Full Code.  Family Communication: No family at the bedside. Disposition Plan: Home 06/21/16 if INR therapeutic.   Medical Consultants:    Cardiology  Gastroenterology  Surgery  Nephrology   Procedures:   ERCP with sphincterotomy/stent 06/11/16  Anti-Infectives:   Zosyn 06/09/16 --->06/15/16 Vancomycin 06/10/16---> 06/15/16 Augmentin 06/15/16--->  Subjective:   [name] is more awake.  No abdominal pain, nausea or vomiting.  Bowels are moving.    Objective:    Filed Vitals:   06/19/16 1836 06/19/16 2031 06/20/16 0600 06/20/16 0620  BP: 98/56 108/57  97/48  Pulse: 76 73  77  Temp: 97.2 F (36.2 C) 97.6 F (36.4 C)  98 F (36.7 C)  TempSrc: Oral Oral  Oral  Resp:  18  18  Height:      Weight:   83.326 kg (183 lb 11.2 oz)  SpO2:  100%  98%    Intake/Output Summary (Last 24 hours) at 06/20/16 0739 Last data filed at 06/20/16 0600  Gross per 24 hour  Intake    648 ml  Output   2600 ml  Net  -1952 ml   Filed Weights   06/19/16 1435 06/19/16 1815 06/20/16 0600   Weight: 86.7 kg (191 lb 2.2 oz) 84.7 kg (186 lb 11.7 oz) 83.326 kg (183 lb 11.2 oz)    Exam: General exam: Appears calm and comfortable.  Respiratory system: Clear to auscultation. Respiratory effort normal. Cardiovascular system: S1 & S2 heard, RRR. No JVD,  rubs, gallops or clicks. No murmurs. Gastrointestinal system: Abdomen is nondistended, soft and nontender. No organomegaly or masses felt. Normal bowel sounds heard. Central nervous system: More awake today.  No focal neurological deficits. Extremities: No clubbing, edema, or cyanosis. Skin: Jaundiced with scleral icterus. Psychiatry: Judgement and insight appear normal. Mood & affect appropriate.   Data Reviewed:   I have personally reviewed following labs and imaging studies:  Labs: Basic Metabolic Panel:  Recent Labs Lab 06/16/16 0220 06/17/16 0241 06/18/16 0209 06/19/16 0745 06/20/16 0240  NA 127* 127* 127* 128* 133*  K 5.0 5.6* 4.9 4.5 3.8  CL 92* 93* 92* 96* 98*  CO2 18* 18* 21* 17* 24  GLUCOSE 165* 142* 106* 122* 149*  BUN 100* 111* 63* 77* 43*  CREATININE 5.65* 6.42* 4.93* 5.86* 4.29*  CALCIUM 9.1 9.2 8.9 8.8* 8.6*   GFR Estimated Creatinine Clearance: 18.7 mL/min (by C-G formula based on Cr of 4.29). Liver Function Tests:  Recent Labs Lab 06/16/16 0220 06/17/16 0241 06/18/16 0209 06/19/16 0745 06/20/16 0240  AST 134* 113* 86* 47* 37  ALT 125* 117* 91* 61 52  ALKPHOS 156* 151* 133* 137* 142*  BILITOT 3.5* 3.2* 2.6* 2.1* 2.0*  PROT 7.5 7.5 6.9 6.9 6.9  ALBUMIN 2.1* 2.1* 1.9* 2.0* 1.9*    CBC:  Recent Labs Lab 06/14/16 0958  06/16/16 0220 06/17/16 0241 06/18/16 0209 06/19/16 0745 06/20/16 0240  WBC 17.5*  < > 17.0* 20.0* 12.0* 8.5 8.1  NEUTROABS 16.0*  --   --   --   --   --   --   HGB 10.8*  < > 11.4* 11.0* 11.9* 10.1* 10.2*  HCT 31.0*  < > 33.0* 31.4* 34.1* 29.3* 29.4*  MCV 91.2  < > 91.2 91.3 91.4 92.4 92.2  PLT 75*  < > 85* 118* 93* 77* 83*  < > = values in this interval not  displayed. CBG:  Recent Labs Lab 06/19/16 0609 06/19/16 1145 06/19/16 1836 06/19/16 2130 06/20/16 0621  GLUCAP 148* 126* 121* 118* 163*   Microbiology Recent Results (from the past 240 hour(s))  Culture, blood (Routine X 2) w Reflex to ID Panel     Status: None   Collection Time: 06/11/16  7:25 AM  Result Value Ref Range Status   Specimen Description BLOOD LEFT HAND  Final   Special Requests BOTTLES DRAWN AEROBIC AND ANAEROBIC  5CC  Final   Culture NO GROWTH 5 DAYS  Final   Report Status 06/16/2016 FINAL  Final  Culture, blood (Routine X 2) w Reflex to ID Panel     Status: None   Collection Time: 06/11/16  7:29 AM  Result Value Ref Range Status   Specimen Description BLOOD LEFT HAND  Final   Special Requests BOTTLES DRAWN AEROBIC ONLY  5CC  Final   Culture NO GROWTH 5 DAYS  Final  Report Status 06/16/2016 FINAL  Final    Radiology: No results found.  Medications:   . amoxicillin-clavulanate  1 tablet Oral QHS  . ferrous sulfate  325 mg Oral BID WC  . insulin aspart  0-15 Units Subcutaneous TID WC  . insulin glargine  15 Units Subcutaneous QHS  . metoprolol succinate  12.5 mg Oral BID  . pantoprazole  40 mg Oral Daily  . sodium chloride flush  3 mL Intravenous Q12H  . Warfarin - Pharmacist Dosing Inpatient   Does not apply q1800   Continuous Infusions: . heparin 1,200 Units/hr (06/19/16 2154)    Time spent: 25 minutes.    LOS: 10 days   [facility] Pager [phone]. If unable to reach me by pager, please call my cell phone at [phone].  *Please refer to [email or web address], password [email or web address] to get updated schedule on who will round on this patient, as hospitalists switch teams weekly. If 7PM-7AM, please contact night-coverage at [email or web address], password [email or web address] for any overnight needs.  06/20/2016, 7:39 AM

## 2016-06-20 NOTE — Progress Notes (Signed)
ANTICOAGULATION CONSULT NOTE - Follow Up Consult  Pharmacy Consult for Coumadin , continue heparin bridge Indication: atrial fibrillation  No Known Allergies  Patient Measurements: Height: 5\' 10"  (177.8 cm) Weight: 183 lb 11.2 oz (83.326 kg) IBW/kg (Calculated) : 73 Heparin Dosing Weight: 82 kg  Labs:  Recent Labs  06/18/16 0209 06/18/16 0412 06/19/16 0745 06/20/16 0240  HGB 11.9*  --  10.1* 10.2*  HCT 34.1*  --  29.3* 29.4*  PLT 93*  --  77* 83*  LABPROT  --   --  17.8* 24.7*  INR  --   --  1.46 2.25*  HEPARINUNFRC  --  0.37 0.64 0.61  CREATININE 4.93*  --  5.86* 4.29*    Estimated Creatinine Clearance: 18.7 mL/min (by C-G formula based on Cr of 4.29).   Past Medical History  Diagnosis Date  . Essential hypertension, benign   . CAD (coronary artery disease)     a. BMS [facility] LAD 2001 at [facility] b. PTCA/atherectomy ramus and BMS to LAD 2009  . Chronic systolic heart failure ([place])   . GERD (gastroesophageal reflux disease)   . Paroxysmal atrial fibrillation (HCC)     a. on amiodarone, digoxin and Eliquis  . TIA (transient ischemic attack)   . HLD (hyperlipidemia)   . Orthostatic hypotension   . Dizziness     a. chronic. Admission for this 07/18/2014  . Ischemic cardiomyopathy     EF 18% by nuclear study 2016, multiple myocardial infarctions in past    . Obesity   . Hematuria   . History of blood transfusion     "I've had 2 units so far this year" (09/27/2015)  . Anemia   . SAH (subarachnoid hemorrhage) ([place])     post-traumatic (fall) [facility] 12/2014  . PONV (postoperative nausea and vomiting)   . CHF (congestive heart failure) ([place])   . Myocardial infarction ([place]) 2001  . HCAP (healthcare-associated pneumonia) 07/21/2015  . OSA on CPAP   . Chronic kidney disease, stage IV (severe) ([place])   . DM type 2, uncontrolled, with renal complications ([place])   . Colostomy in place [facility])   . Adenocarcinoma of rectum ([place])     a. 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 Prostate cancer ([place])     a. s/p  seed implants with chemo and radiation  . Cholelithiasis 06/2015      Medications:  Infusions:  . heparin 1,200 Units/hr (06/20/16 1000)    Assessment: 59yom admitted with acute cholecystis s/p ERCP and stent placed.PTA apixaban held on admit for possible surgery and remains on hold until patient more stable. Last apixaban dose 7/4 PTA. Pt on heparin bridge for stroke prevention until oral anticoagulation restarted. Patient with AKI/CKD stage 4 in setting of acute illness.  Hemodialysis initiated 06/15/16. Dr. Hollice Gong is switching oral AC from Apixaban (pta; Held inpatient) to Coumadin due to CKD/now on hemodialysis.  Last apixaban dose taken PTA on 7/4 pm due to originally held due to potential surgery.  -He has a history of severe ICM and rectal cancer status post partial colectomy and colostomy. AST/ALT: 268/171>200/150> 134/125>113/117>86/91, total bili  decreased to 91, acute cholecystis s/p ERCP. To continue on IV heparin drip bridging to therapeutic coumadin. Heparin level was at goal this morning.  Hgb low/stable at 10.2,  PLTC low/stable 83 today. INR jumped all the way up to 2.25 today. Will cont heparin for another day to eval INR  Goal of Therapy:  Heparin level: 0.3-0.7 Monitor platelets by anticoagulation protocol: Yes   Plan:  Continue heparin gtt at 1200 units/hr as previously noted. Coumadin 1mg  PO x1 Follow up AM heparin level, CBC and INR  Onnie Boer, PharmD Pager: [phone] 06/20/2016 10:40 AM

## 2016-06-21 ENCOUNTER — Ambulatory Visit: Payer: Self-pay | Admitting: *Deleted

## 2016-06-21 ENCOUNTER — Ambulatory Visit (INDEPENDENT_AMBULATORY_CARE_PROVIDER_SITE_OTHER): Payer: Medicare Other | Admitting: *Deleted

## 2016-06-21 DIAGNOSIS — I255 Ischemic cardiomyopathy: Secondary | ICD-10-CM | POA: Diagnosis not present

## 2016-06-21 DIAGNOSIS — I5022 Chronic systolic (congestive) heart failure: Secondary | ICD-10-CM | POA: Diagnosis not present

## 2016-06-21 LAB — CBC
HCT: 29 % — ABNORMAL LOW (ref 39.0–52.0)
HEMOGLOBIN: 9.6 g/dL — AB (ref 13.0–17.0)
MCH: 30.7 pg (ref 26.0–34.0)
MCHC: 33.1 g/dL (ref 30.0–36.0)
MCV: 92.7 fL (ref 78.0–100.0)
Platelets: 77 10*3/uL — ABNORMAL LOW (ref 150–400)
RBC: 3.13 MIL/uL — AB (ref 4.22–5.81)
RDW: 13.6 % (ref 11.5–15.5)
WBC: 7 10*3/uL (ref 4.0–10.5)

## 2016-06-21 LAB — PROTIME-INR
INR: 4.35 (ref 0.00–1.49)
Prothrombin Time: 40.5 seconds — ABNORMAL HIGH (ref 11.6–15.2)

## 2016-06-21 LAB — HEPARIN LEVEL (UNFRACTIONATED): Heparin Unfractionated: 0.62 IU/mL (ref 0.30–0.70)

## 2016-06-21 LAB — GLUCOSE, CAPILLARY: GLUCOSE-CAPILLARY: 123 mg/dL — AB (ref 65–99)

## 2016-06-21 MED ORDER — INSULIN GLARGINE 100 UNIT/ML ~~LOC~~ SOLN: 15.0000 [IU] | Freq: Every morning | SUBCUTANEOUS | Status: DC

## 2016-06-21 MED ORDER — WARFARIN SODIUM 1 MG PO TABS: 1.0000 mg | ORAL_TABLET | Freq: Every day | ORAL | Status: DC

## 2016-06-21 NOTE — Progress Notes (Signed)
Patient ID: [name], male   DOB: [birth year], 60 y.o.   MRN: [id number] S:Nearly one liter of UOP over last 24 hours- but BUN and creatinine rising off HD-  INR is therapeutic- even supratherapeutic- plan is for discharge today - has appropriate questions regarding his future   O:BP 100/57 mmHg  Pulse 73  Temp(Src) 97.8 F (36.6 C) (Oral)  Resp 19  Ht 5\' 10"  (1.778 m)  Wt 82.872 kg (182 lb 11.2 oz)  BMI 26.21 kg/m2  SpO2 98%  Intake/Output Summary (Last 24 hours) at 06/21/16 0825 Last data filed at 06/21/16 0739  Gross per 24 hour  Intake    684 ml  Output   1275 ml  Net   -591 ml   Intake/Output: I/O last 3 completed shifts: In: 945 [P.O.:660; I.V.:285] Out: 1150 [Urine:1150]  Intake/Output this shift:  Total I/O In: -  Out: 350 [Urine:350] Weight change: -3.828 kg (-8 lb 7 oz) Gen:wd wn wm in nad CVS:no rub Resp:cta [id number] Ext: bleeding from left elbow (soaking dressing) Right upper arm AVF    Recent Labs Lab 06/14/16 0958 06/15/16 0210 06/16/16 0220 06/17/16 0241 06/18/16 0209 06/19/16 0745 06/20/16 0240  NA 126* 124* 127* 127* 127* 128* 133*  K 4.9 5.3* 5.0 5.6* 4.9 4.5 3.8  CL 92* 90* 92* 93* 92* 96* 98*  CO2 17* 16* 18* 18* 21* 17* 24  GLUCOSE 213* 185* 165* 142* 106* 122* 149*  BUN 106* 120* 100* 111* 63* 77* 43*  CREATININE 5.82* 6.07* 5.65* 6.42* 4.93* 5.86* 4.29*  ALBUMIN 2.0* 2.0* 2.1* 2.1* 1.9* 2.0* 1.9*  CALCIUM 7.8* 8.5* 9.1 9.2 8.9 8.8* 8.6*  AST 266* 200* 134* 113* 86* 47* 37  ALT 171* 150* 125* 117* 91* 61 52   Liver Function Tests:  Recent Labs Lab 06/18/16 0209 06/19/16 0745 06/20/16 0240  AST 86* 47* 37  ALT 91* 61 52  ALKPHOS 133* 137* 142*  BILITOT 2.6* 2.1* 2.0*  PROT 6.9 6.9 6.9  ALBUMIN 1.9* 2.0* 1.9*   No results for input(s): LIPASE, AMYLASE in the last 168 hours. No results for input(s): AMMONIA in the last 168 hours. CBC:  Recent Labs Lab 06/14/16 0958  06/17/16 0241 06/18/16 0209 06/19/16 0745  06/20/16 0240 06/21/16 0221  WBC 17.5*  < > 20.0* 12.0* 8.5 8.1 7.0  NEUTROABS 16.0*  --   --   --   --   --   --   HGB 10.8*  < > 11.0* 11.9* 10.1* 10.2* 9.6*  HCT 31.0*  < > 31.4* 34.1* 29.3* 29.4* 29.0*  MCV 91.2  < > 91.3 91.4 92.4 92.2 92.7  PLT 75*  < > 118* 93* 77* 83* 77*  < > = values in this interval not displayed. Cardiac Enzymes: No results for input(s): CKTOTAL, CKMB, CKMBINDEX, TROPONINI in the last 168 hours. CBG:  Recent Labs Lab 06/20/16 0621 06/20/16 1132 06/20/16 1641 06/20/16 2039 06/21/16 0653  GLUCAP 163* 164* 149* 157* 123*    Iron Studies: No results for input(s): IRON, TIBC, TRANSFERRIN, FERRITIN in the last 72 hours. Studies/Results: No results found. . ferrous sulfate  325 mg Oral BID WC  . insulin aspart  0-15 Units Subcutaneous TID WC  . insulin glargine  15 Units Subcutaneous QHS  . metoprolol succinate  12.5 mg Oral BID  . pantoprazole  40 mg Oral Daily  . sodium chloride flush  3 mL Intravenous Q12H  . Warfarin - Pharmacist Dosing Inpatient   Does  not apply q1800    BMET    Component Value Date/Time   NA 133* 06/20/2016 0240   K 3.8 06/20/2016 0240   CL 98* 06/20/2016 0240   CO2 24 06/20/2016 0240   GLUCOSE 149* 06/20/2016 0240   BUN 43* 06/20/2016 0240   CREATININE 4.29* 06/20/2016 0240   CREATININE 2.67* 03/18/2016 0917   CALCIUM 8.6* 06/20/2016 0240   CALCIUM 8.6 08/26/2015 0428   GFRNONAA 14* 06/20/2016 0240   GFRAA 16* 06/20/2016 0240   CBC    Component Value Date/Time   WBC 7.0 06/21/2016 0221   WBC 9.3 05/02/2012 0852   RBC 3.13* 06/21/2016 0221   RBC 3.03* 08/26/2015 0428   RBC 4.68 05/02/2012 0852   HGB 9.6* 06/21/2016 0221   HGB 14.6 05/02/2012 0852   HCT 29.0* 06/21/2016 0221   HCT 23.6* 04/24/2015 1116   HCT 43.6 05/02/2012 0852   PLT 77* 06/21/2016 0221   PLT 163 05/02/2012 0852   MCV 92.7 06/21/2016 0221   MCV 93.3 05/02/2012 0852   MCH 30.7 06/21/2016 0221   MCH 31.2 05/02/2012 0852   MCHC 33.1  06/21/2016 0221   MCHC 33.4 05/02/2012 0852   RDW 13.6 06/21/2016 0221   RDW 14.1 05/02/2012 0852   LYMPHSABS 0.8 06/14/2016 0958   LYMPHSABS 1.2 05/02/2012 0852   MONOABS 0.6 06/14/2016 0958   MONOABS 0.6 05/02/2012 0852   EOSABS 0.0 06/14/2016 0958   EOSABS 0.0 05/02/2012 0852   BASOSABS 0.0 06/14/2016 0958   BASOSABS 0.0 05/02/2012 0852     Assessment/Plan:  1. AKI/CKD stage 4 (baseline Scr 2-3) in setting of acute illness, likely ischemic ATN 1. HD initiated on 06/15/16, continue with TTS- BUN and creatinine rising off of HD as of now 2. Set up for [facility] on TTS schedule second shift (plan as acute for now)- next treatment planned for Tuesday (tomorrow)  2. Systolic CHF due to ischemic cardiomyopathy 3. Cholecystitis- s/p ERCP/stent 06/11/16- no issues there 4. Vascular access- mature AVF placed and successfully cannulated 5. Chronic A fib on hep gtt awaiting oral anticoagulation to reach therapeutic levels 6. Anemia- will need ESA- apparently not given here 7. Bones- no parameters checked- since leaving today will leave to his HD unit   Ayeisha Lindenberger A

## 2016-06-21 NOTE — Care Management Note (Signed)
Case Management Note  Patient Details  Name: CALAN OTANEZ MRN: [id number] Date of Birth: 1956/03/07  Subjective/Objective:    Patient lives with wife, he is indep,per pt eval no pt f/u needed. Patient states he does not need any HH services at this time. He has a PCP,and he has transportation. He is for discharge today, no needs                Action/Plan:   Expected Discharge Date:                  Expected Discharge Plan:  Home/Self Care  In-House Referral:     Discharge planning Services  CM Consult  Post Acute Care Choice:    Choice offered to:     DME Arranged:    DME Agency:     HH Arranged:    HH Agency:     Status of Service:  Completed, signed off  If discussed at [employer] of Stay Meetings, dates discussed:    Additional Comments:  Zenon Mayo, RN 06/21/2016, 1:51 PM

## 2016-06-21 NOTE — Progress Notes (Signed)
PT Cancellation Note  Patient Details Name: [name] MRN: [id number] DOB: Feb 16, 1956   Cancelled Treatment:    Reason Eval/Treat Not Completed: PT screened, no needs identified, will sign off   Patient reports he was up walking multiple times since last seen by PT. Reports walking to bathroom independently now without any imbalance. Noted evaluating therapist recommended no follow-up PT and no DME needs. Patient agrees.   [name] 06/21/2016, 10:14 AM  Pager [phone]

## 2016-06-21 NOTE — Progress Notes (Signed)
Reviewed all discharge instructions, including home medications and follow-up appointments.  Patient stated he is to follow up with outpatient dialysis at the [facility] clinic and not the [facility] clinic as listed on discharge instruction sheet (AVS).  Spoke with staff at [facility] and they confirmed he is not assigned to them and gave me phone number for [facility] clinic [phone]) and I spoke with staff there.  Staff at [facility] confirmed [name]'s appointment tomorrow at 45 oclock and asked that he arrive at 11:30 am for paperwork.  This was explained to patient and written on discharge instruction sheet.  Patient stated understanding. Bethena Roys, in [facility]'s dialysis inpatient unit, called and she confirmed [name] also and stated she will make an addendum to patien'ts EMR.  Patient stated he has personal belongings he brought to hospital, including Jewelry and watch.  No voiced complaints.

## 2016-06-21 NOTE — Discharge Summary (Signed)
Physician Discharge Summary  [name] [id number] DOB: 02-24-1956 DOA: 06/09/2016  PCP: Glo Herring., MD  Admit date: 06/09/2016 Discharge date: 06/21/2016   Recommendations for Outpatient Follow-Up:   1. Patient will follow-up for hemodialysis at the [facility].   Discharge Diagnosis:   Principal Problem:    Chronic cholecystitis Active Problems:    History of rectal cancer    Ischemic cardiomyopathy-EF 35% 04/24/15 echo    CAD with LCX disease and stents to LAD    Chronic systolic heart failure (HCC)    Paroxysmal atrial fibrillation (HCC)    DM type 2, uncontrolled, with renal complications (HCC)    Gallbladder polyp    GERD (gastroesophageal reflux disease)    Orthostatic hypotension    OSA on CPAP    Hyperlipidemia    Transaminitis    Anemia of chronic disease    Right heart failure (HCC)    Cholecystitis    Biliary disease with obstruction    AKI (acute kidney injury) ([place])    Acute on chronic systolic CHF (congestive heart failure) ([place])   Discharge disposition:  Home.    Discharge Condition: Improved.  Diet recommendation: Low sodium, heart healthy.  Carbohydrate-modified.    History of Present Illness:   [name] is an 60 y.o. male with a PMH of CAD, severe ischemic cardiomyopathy/CHF (EF 10-15% by echo earlier this year, he has an ICD, followed by Dr. Haroldine Laws), atrial fibrillatin (now V paced, CHADS-Vasc score of 5, anticoagulated with Eliquis), CKD 4, DM, SAH, and colostomy S/P treatment for rectal cancer and chronic cholecystitis, hospitalized from 05/09/16-05/12/16 with conservative management recommended by surgery given his significant cardiac risk factors. No drain was placed at that time and a HIDA scan was declined by the patient. The patient was readmitted 06/09/16 with increased right upper quadrant pain associated with nausea and vomiting. He was placed on Zosyn and surgery/GI consulted for  assistance with management. Underwent ERCP/stent 06/11/16.   Hospital Course by Problem:    Principal Problem:  Chronic cholecystitis/Gallbladder polyp/transaminitis/biliary disease with obstruction Underwent ERCP with sphincterotomy and stent placement 06/11/16 with plans to consider an elective cholecystectomy in a few weeks. Completed a course of antibiotics. WBC and LFTs continued to trend down. Blood cultures were negative.  Active Problems:  Thrombocytopenia Monitored. No signs of bleeding.   Hyponatremia, Improving Likely secondary to CHF physiology.   Acute kidney injury in the setting of stage IV chronic kidney disease Patient's deterioration in renal function was felt to be secondary to ischemic ATN. HD initiated 06/15/16. CLIP process initiated for [facility] on TTS schedule (plan as acute for now).   History of rectal cancer Status post surgical resection and colostomy placement 2008. He has follow up with Dr Benay Spice. Recent CT (05/02/16) with no evidence of recurrence.   Ischemic cardiomyopathy-EF 35% 04/24/15 echo / Chronic systolic heart failure (HCC)/right heart failure History of brittle systolic HF. Echo 05/11/16 with LVEF 10-15%, mild AI, mild MR, normal RV. Volume status controlled with HD. Continue metoprolol.   CAD with LCX disease and stents to LAD s/p NSTEMI in 04/20/16.   Paroxysmal atrial fibrillation (HCC) CHADS2VASC score of 5. S/p AV node ablation. V-paced. Anticoagulated with heparin/Coumadin. INR now therapeutic. Can stop heparin 06/21/16 if INR remains therapeutic.   DM type 2, uncontrolled, with renal complications (HCC) Currently being managed with 15 units of Lantus and moderate scale SSI. CBGs 118-163.   GERD (gastroesophageal reflux disease) Continue PPI.   OSA on  CPAP   Hyperlipidemia Lipitor currently on hold pending resolution of transaminitis.   Anemia of chronic disease Hemoglobin stable.    Medical Consultants:     Cardiology  Gastroenterology  Surgery  Nephrology   Discharge Exam:   Filed Vitals:   06/20/16 2008 06/21/16 0405  BP: 99/56 100/57  Pulse: 77 73  Temp: 97.6 F (36.4 C) 97.8 F (36.6 C)  Resp: 18 19   Filed Vitals:   06/20/16 1021 06/20/16 1158 06/20/16 2008 06/21/16 0405  BP: 92/51 101/54 99/56 100/57  Pulse:  73 77 73  Temp: 98.3 F (36.8 C) 97.7 F (36.5 C) 97.6 F (36.4 C) 97.8 F (36.6 C)  TempSrc: Oral Oral Oral Oral  Resp: 16  18 19   Height:      Weight:    82.872 kg (182 lb 11.2 oz)  SpO2: 98% 99% 100% 98%   General exam: Appears calm and comfortable.  Respiratory system: Clear to auscultation. Respiratory effort normal. Cardiovascular system: S1 & S2 heard, RRR. No JVD, rubs, gallops or clicks. No murmurs. Gastrointestinal system: Abdomen is nondistended, soft and nontender. No organomegaly or masses felt. Normal bowel sounds heard. Central nervous system: More awake today. No focal neurological deficits. Extremities: No clubbing, edema, or cyanosis. Skin: Jaundiced with scleral icterus. Psychiatry: Judgement and insight appear normal. Mood & affect appropriate.   The results of significant diagnostics from this hospitalization (including imaging, microbiology, ancillary and laboratory) are listed below for reference.     Procedures and Diagnostic Studies:   Dg Chest 2 View  06/09/2016  CLINICAL DATA:  Chest pain with shortness of breath for 1 day EXAM: CHEST  2 VIEW COMPARISON:  May 09, 2016 FINDINGS: There is no edema or consolidation. Heart is upper normal in size with pulmonary vascularity within normal limits. Pacemaker leads are attached to the right heart, stable. No adenopathy. No pneumothorax. There is atherosclerotic calcification in the aorta. There is degenerative change in the thoracic spine and in the shoulders bilaterally. IMPRESSION: No edema or consolidation. Stable pacemaker lead positions. Stable cardiac silhouette. Aortic  atherosclerosis. Electronically Signed   By: Lowella Grip III M.D.   On: 06/09/2016 07:28   Nm Hepato W/eject Fract  06/09/2016  CLINICAL DATA:  Right upper quadrant pain. Nausea, vomiting. Cholelithiasis. Elevated LFTs. EXAM: NUCLEAR MEDICINE HEPATOBILIARY IMAGING TECHNIQUE: Sequential images of the abdomen were obtained out to 60 minutes following intravenous administration of radiopharmaceutical. RADIOPHARMACEUTICALS:  5.1 mCi Tc-65m  Choletec IV COMPARISON:  Ultrasound performed today. Ultrasound 05/10/2016. CT 05/05/2016. FINDINGS: There is uptake of radiotracer by the liver. This is somewhat delayed as there is still some activity seen within the heart out to 2 hours. There is no excretion of radiotracer by the liver. Nonvisualization of the biliary system out to 2 hours. IMPRESSION: Somewhat delayed hepatic uptake of radiotracer. No hepatic excretion of radiotracer. Burtis Junes this is related to hepatic dysfunction, possibly hepatitis. Cannot assess gallbladder filling without biliary excretion. Given the findings on recent ultrasound, recommend further evaluation of the biliary system with MRI and MRCP. Electronically Signed   By: Rolm Baptise M.D.   On: 06/09/2016 16:54   US Abdomen Limited  06/10/2016  CLINICAL DATA:  Acute onset of generalized abdominal pain and hyperbilirubinemia. Initial encounter. EXAM: US ABDOMEN LIMITED - RIGHT UPPER QUADRANT COMPARISON:  Right upper quadrant ultrasound performed 06/09/2016 FINDINGS: Gallbladder: Stones within the gallbladder measure up to 2.0 cm in size. The gallbladder wall is borderline prominent. No pericholecystic fluid is seen. No sonographic  Murphy sign noted by sonographer. Common bile duct: Diameter: 0.9 cm, dilated in appearance. Liver: No focal lesion identified. Mildly nodular contour, with mild heterogeneity, raising concern for mild cirrhosis. IMPRESSION: 1. Cholelithiasis. Borderline prominence of the gallbladder wall, and dilatation of the common  bile duct to 0.9 cm, raising concern for distal obstruction. Would correlate with LFTs, and consider MRCP or ERCP for further evaluation. 2. Findings suggest mild hepatic cirrhosis. Electronically Signed   By: Garald Balding M.D.   On: 06/10/2016 23:27   US Abdomen Limited Ruq  06/09/2016  CLINICAL DATA:  Right upper quadrant abdominal pain since midnight, known kidney stones, cirrhosis, diabetes, prostate malignancy. EXAM: US ABDOMEN LIMITED - RIGHT UPPER QUADRANT COMPARISON:  Abdominal ultrasound of May 10, 2016 FINDINGS: Gallbladder: The gallbladder is adequately distended there is mild wall thickening to 4.3 mm. There is no positive sonographic Murphy's sign. There are echogenic mobile shadowing stones measuring up to 1.9 cm in diameter. Common bile duct: Diameter: 8.2 mm.  No intraluminal stones are observed. Liver: The hepatic echotexture is heterogeneous. The surface contour is irregular. There is no focal mass or ductal dilation. IMPRESSION: 1. Gallstones and mild gallbladder wall thickening possibly reflecting cholecystitis. No pericholecystic fluid or positive sonographic Murphy's sign. 2. Mild dilation of the common bile duct without evidence of common duct stones. 3. Cirrhotic changes within the liver, stable. Electronically Signed   By: David  Martinique M.D.   On: 06/09/2016 08:01     Labs:   Basic Metabolic Panel:  Recent Labs Lab 06/16/16 0220 06/17/16 0241 06/18/16 0209 06/19/16 0745 06/20/16 0240  NA 127* 127* 127* 128* 133*  K 5.0 5.6* 4.9 4.5 3.8  CL 92* 93* 92* 96* 98*  CO2 18* 18* 21* 17* 24  GLUCOSE 165* 142* 106* 122* 149*  BUN 100* 111* 63* 77* 43*  CREATININE 5.65* 6.42* 4.93* 5.86* 4.29*  CALCIUM 9.1 9.2 8.9 8.8* 8.6*   GFR Estimated Creatinine Clearance: 18.7 mL/min (by C-G formula based on Cr of 4.29). Liver Function Tests:  Recent Labs Lab 06/16/16 0220 06/17/16 0241 06/18/16 0209 06/19/16 0745 06/20/16 0240  AST 134* 113* 86* 47* 37  ALT 125* 117* 91*  61 52  ALKPHOS 156* 151* 133* 137* 142*  BILITOT 3.5* 3.2* 2.6* 2.1* 2.0*  PROT 7.5 7.5 6.9 6.9 6.9  ALBUMIN 2.1* 2.1* 1.9* 2.0* 1.9*   Coagulation profile  Recent Labs Lab 06/19/16 0745 06/20/16 0240 06/21/16 0221  INR 1.46 2.25* 4.35*    CBC:  Recent Labs Lab 06/17/16 0241 06/18/16 0209 06/19/16 0745 06/20/16 0240 06/21/16 0221  WBC 20.0* 12.0* 8.5 8.1 7.0  HGB 11.0* 11.9* 10.1* 10.2* 9.6*  HCT 31.4* 34.1* 29.3* 29.4* 29.0*  MCV 91.3 91.4 92.4 92.2 92.7  PLT 118* 93* 77* 83* 77*   CBG:  Recent Labs Lab 06/20/16 0621 06/20/16 1132 06/20/16 1641 06/20/16 2039 06/21/16 0653  GLUCAP 163* 164* 149* 157* 123*     Discharge Instructions:   Discharge Instructions    (HEART FAILURE PATIENTS) Call MD:  Anytime you have any of the following symptoms: 1) 3 pound weight gain in 24 hours or 5 pounds in 1 week 2) shortness of breath, with or without a dry hacking cough 3) swelling in the hands, feet or stomach 4) if you have to sleep on extra pillows at night in order to breathe.    Complete by:  As directed      Amb Referral to Cardiac Rehabilitation    Complete by:  As directed   Diagnosis:  Heart Failure (see criteria below if ordering Phase II)  Heart Failure Type:  Chronic Systolic     Call MD for:  extreme fatigue    Complete by:  As directed      Call MD for:  persistant dizziness or light-headedness    Complete by:  As directed      Call MD for:  persistant nausea and vomiting    Complete by:  As directed      Call MD for:  severe uncontrolled pain    Complete by:  As directed      Call MD for:  temperature >100.4    Complete by:  As directed      Diet - low sodium heart healthy    Complete by:  As directed      Diet general    Complete by:  As directed      Increase activity slowly    Complete by:  As directed             Medication List    STOP taking these medications        apixaban 5 MG Tabs tablet  Commonly known as:  ELIQUIS      metolazone 2.5 MG tablet  Commonly known as:  ZAROXOLYN     potassium chloride SA 20 MEQ tablet  Commonly known as:  K-DUR,KLOR-CON     torsemide 20 MG tablet  Commonly known as:  DEMADEX      TAKE these medications        atorvastatin 80 MG tablet  Commonly known as:  LIPITOR  Take 1 tablet (80 mg total) by mouth daily.     co-enzyme Q-10 50 MG capsule  Take 50 mg by mouth every morning.     ferrous sulfate 325 (65 FE) MG tablet  Take 1 tablet (325 mg total) by mouth 2 (two) times daily with a meal.     Fish Oil 1000 MG Caps  Take 1,000 mg by mouth 2 (two) times daily.     insulin glargine 100 UNIT/ML injection  Commonly known as:  LANTUS  Inject 0.15 mLs (15 Units total) into the skin every morning.     metoprolol succinate 25 MG 24 hr tablet  Commonly known as:  TOPROL-XL  Take 0.5 tablets (12.5 mg total) by mouth 2 (two) times daily.     nitroGLYCERIN 0.4 MG/SPRAY spray  Commonly known as:  NITROLINGUAL  Place 1 spray under the tongue every 5 (five) minutes x 3 doses as needed for chest pain. Reported on 06/01/2016     pantoprazole 40 MG tablet  Commonly known as:  PROTONIX  Take 1 tablet (40 mg total) by mouth daily.     PROAIR RESPICLICK 123XX123 (90 Base) MCG/ACT Aepb  Generic drug:  Albuterol Sulfate  Inhale 2 puffs into the lungs every 6 (six) hours as needed (for breathing).     traMADol 50 MG tablet  Commonly known as:  ULTRAM  Take 1 tablet (50 mg total) by mouth every 6 (six) hours as needed.     warfarin 1 MG tablet  Commonly known as:  COUMADIN  Take 1 tablet (1 mg total) by mouth daily. Start 7/20. Take as directed based on your blood tests.           Follow-up Information    Follow up with Glori Bickers, MD On 07/16/2016.   Specialty:  Cardiology   Why:  at 1020 for  post hospital follow up. Please bring all of your medications to your visit.  The code for parking is 0003.   Contact information:   [address] [place]  [place] [address] [phone]       Follow up with [facility] On 06/22/2016.   Why:  for your dialysis treatment at 12:00   Contact information:   [place] [address] [phone]        Time coordinating discharge: 35 minutes.  Signed:  RAMA,CHRISTINA  Pager [phone] [facility] 06/21/2016, 5:18 PM

## 2016-06-21 NOTE — Care Management Note (Signed)
Case Management Note  Patient Details  Name: [name] MRN: [id number] Date of Birth: March 01, 1956  Subjective/Objective:   Patient lives with wife, he is indep,per pt eval no pt f/u needed.  Patient states he does not need any HH services at this time.  He has a PCP,and he has transportation.  He is for discharge today, no needs.                  Action/Plan:   Expected Discharge Date:                  Expected Discharge Plan:     In-House Referral:     Discharge planning Services     Post Acute Care Choice:    Choice offered to:     DME Arranged:    DME Agency:     HH Arranged:    HH Agency:     Status of Service:     If discussed at [employer] of Stay Meetings, dates discussed:    Additional Comments:  Zenon Mayo, RN 06/21/2016, 11:24 AM

## 2016-06-21 NOTE — Progress Notes (Signed)
06/21/2016 12:12 PM Hemodialysis Outpatient Note; this is to clarify where Mr. Bumgardner will be attending outpatient dialysis. He will be receiving dialysis at the [facility] [place] [address]. Tel # [phone]. Thank you. Gordy Savers

## 2016-06-21 NOTE — Progress Notes (Deleted)
Advanced Heart Failure Rounding Note  Referring Physician: Dr Lily Kocher PCP: Dr. Gerarda Fraction Oncologist: Dr Benay Spice.  CHF: Bensimhon  Reason for Consultation: Cardiac consideration/clearance for GI surgery.   Subjective:    60 y/o with brittle systolic HF. EF 20% and CKD IV. Admitted 06/09/16 with recurrent biliary colic found to have biliary pancreatitis. Course c/b worsening renal failure  Underwent ERCP with stent placement on 7/7 with symptom improvement.   WBC 23.6 -> 20.4 -> 15.0 -> 17.0 -> 20.0 -> 12.0  Started on dialysis 06/15/16. Next dialysis 06/21/16 as outpatient   No further ABD pain.  Still producing some urine on his own. No CP, SOB, lightheadedness, or dizziness.   Dry weight looks to be around 182 - 183 lbs.  Objective:   Weight Range: 182 lb 11.2 oz (82.872 kg) Body mass index is 26.21 kg/(m^2).   Vital Signs:   Temp:  [97.6 F (36.4 C)-98.3 F (36.8 C)] 97.8 F (36.6 C) (07/17 0405) Pulse Rate:  [73-77] 73 (07/17 0405) Resp:  [16-19] 19 (07/17 0405) BP: (92-101)/(51-57) 100/57 mmHg (07/17 0405) SpO2:  [98 %-100 %] 98 % (07/17 0405) Weight:  [182 lb 11.2 oz (82.872 kg)] 182 lb 11.2 oz (82.872 kg) (07/17 0405) Last BM Date: 06/20/16  Weight change: Filed Weights   06/19/16 1815 06/20/16 0600 06/21/16 0405  Weight: 186 lb 11.7 oz (84.7 kg) 183 lb 11.2 oz (83.326 kg) 182 lb 11.2 oz (82.872 kg)    Intake/Output:   Intake/Output Summary (Last 24 hours) at 06/21/16 0849 Last data filed at 06/21/16 0739  Gross per 24 hour  Intake    684 ml  Output   1275 ml  Net   -591 ml     Physical Exam: General: NAD,in bed  HEENT: Normal Neck: JVP ~7 cm, no thyromegaly or lymphadenopathy nodule.  No carotid bruit.  Lungs: CTAB, normal effort.  CV: Nondisplaced PMI. Regular S1S2, 2/6 SEM RUSB.  Abdomen: Soft, non-tender, non-distended, no HSM. No bruits or masses. +BS . s/p colostomy. Skin: Intact without lesions or rashes.  Neurologic: Alert  and oriented x 3.  Psych: Normal affect. Extremities: No clubbing or cyanosis.  No ankle edema.Normal pedal pulses.   Telemetry: Reviewed personally, Vpaced rhythm 70s  Labs: CBC  Recent Labs  06/20/16 0240 06/21/16 0221  WBC 8.1 7.0  HGB 10.2* 9.6*  HCT 29.4* 29.0*  MCV 92.2 92.7  PLT 83* 77*   Basic Metabolic Panel  Recent Labs  06/19/16 0745 06/20/16 0240  NA 128* 133*  K 4.5 3.8  CL 96* 98*  CO2 17* 24  GLUCOSE 122* 149*  BUN 77* 43*  CREATININE 5.86* 4.29*  CALCIUM 8.8* 8.6*   Liver Function Tests  Recent Labs  06/19/16 0745 06/20/16 0240  AST 47* 37  ALT 61 52  ALKPHOS 137* 142*  BILITOT 2.1* 2.0*  PROT 6.9 6.9  ALBUMIN 2.0* 1.9*   No results for input(s): LIPASE, AMYLASE in the last 72 hours. Cardiac Enzymes No results for input(s): CKTOTAL, CKMB, CKMBINDEX, TROPONINI in the last 72 hours.  BNP: BNP (last 3 results)  Recent Labs  09/26/15 1820 05/09/16 2343 06/01/16 1200  BNP 1393.4* 377.6* 1016.7*    ProBNP (last 3 results) No results for input(s): PROBNP in the last 8760 hours.   D-Dimer No results for input(s): DDIMER in the last 72 hours. Hemoglobin A1C No results for input(s): HGBA1C in the last 72 hours. Fasting Lipid Panel No results for input(s): CHOL, HDL, LDLCALC,  TRIG, CHOLHDL, LDLDIRECT in the last 72 hours. Thyroid Function Tests No results for input(s): TSH, T4TOTAL, T3FREE, THYROIDAB in the last 72 hours.  Invalid input(s): FREET3  Other results:     Imaging/Studies:  No results found.  Latest Echo  Latest Cath   Medications:     Scheduled Medications: . ferrous sulfate  325 mg Oral BID WC  . insulin aspart  0-15 Units Subcutaneous TID WC  . insulin glargine  15 Units Subcutaneous QHS  . metoprolol succinate  12.5 mg Oral BID  . pantoprazole  40 mg Oral Daily  . sodium chloride flush  3 mL Intravenous Q12H  . Warfarin - Pharmacist Dosing Inpatient   Does not apply q1800    Infusions:     PRN Medications: bisacodyl, HYDROcodone-acetaminophen, magnesium citrate, morphine injection, morphine injection, ondansetron **OR** ondansetron (ZOFRAN) IV, senna-docusate, traZODone   Assessment / Plan   1. Acute on chronic cholecystitis - s/p ERCP  - WBCs stable. Afebrile. Liver enzymes now WNL. - Symptomatically improved. Tolerating carb modified diet. .  2. Chronic systolic CHF - ICM - Echo 05/11/16 with LVEF 10-15%, mild AI, mild MR, normal RV.  - Volume status controlled ref dialysis.  - Continue Toprol XL 12.5 mg BID (home dose). Will not increase with pressures on softer side (90-100) and HD. 3. Chronic Afib with CHADS2VASC score of 5. S/p AV node ablation V-paced  - Was previously on eliquis.  Now on coumadin with dialysis.  Heparin drip stopped 06/20/16 with therapeutic INR.  4. CAD s/p NSTEMI in 04/20/16 - No CP.  5. AKI on CKD stage IV s/p AVF in RUE  - Dialysis started 06/15/16 with creatinine > 6.  - Scheduled for T/Th/S dialysis starting tomorrow. Will likely need permanent dialysis moving forward, though had good UO over weekend. 6. H/o rectal cancer 2008: Has diverting colostomy. He has follow up with Dr Benay Spice. - Recent CT (05/02/16) with no evidence of recurrence. 7. HLD. 8. Supra-therapeutic INR - INR 4.35 this am. Would hold and have INR re-checked Thursday.  Needs to be established with [facility].   Will keep follow up for 07/16/16 as he is initiating dialysis and plans to go to beach at end of month.   Satira Mccallum Tillery PA-C 06/21/2016, 8:49 AM    Advanced Heart Failure Team Pager [phone] (M-F; 7a - 4p)  Please contact [facility] for night-coverage after hours (4p -7a ) and weekends on [email or web address]

## 2016-06-21 NOTE — Care Management Important Message (Signed)
Important Message  Patient Details  Name: [name] MRN: [id number] Date of Birth: [birth year]   Medicare Important Message Given:  Yes    [name] 06/21/2016, 2:55 PM

## 2016-06-22 ENCOUNTER — Other Ambulatory Visit: Payer: Self-pay | Admitting: *Deleted

## 2016-06-22 DIAGNOSIS — Z992 Dependence on renal dialysis: Secondary | ICD-10-CM | POA: Diagnosis not present

## 2016-06-22 DIAGNOSIS — N186 End stage renal disease: Secondary | ICD-10-CM | POA: Diagnosis not present

## 2016-06-22 DIAGNOSIS — N184 Chronic kidney disease, stage 4 (severe): Secondary | ICD-10-CM | POA: Diagnosis not present

## 2016-06-22 DIAGNOSIS — N179 Acute kidney failure, unspecified: Secondary | ICD-10-CM | POA: Diagnosis not present

## 2016-06-22 DIAGNOSIS — Z1159 Encounter for screening for other viral diseases: Secondary | ICD-10-CM | POA: Diagnosis not present

## 2016-06-22 DIAGNOSIS — N2581 Secondary hyperparathyroidism of renal origin: Secondary | ICD-10-CM | POA: Diagnosis not present

## 2016-06-22 DIAGNOSIS — D649 Anemia, unspecified: Secondary | ICD-10-CM | POA: Diagnosis not present

## 2016-06-22 NOTE — Progress Notes (Signed)
Remote ICD transmission.   

## 2016-06-22 NOTE — Patient Outreach (Signed)
Transition of care call attempted. No answer to call. Left Voicemail. Plan to attempt again tomorrow.  [name]. [name], RN, BSN, [place] [phone]

## 2016-06-23 ENCOUNTER — Ambulatory Visit: Payer: Self-pay | Admitting: *Deleted

## 2016-06-23 ENCOUNTER — Ambulatory Visit (INDEPENDENT_AMBULATORY_CARE_PROVIDER_SITE_OTHER): Payer: Medicare Other | Admitting: *Deleted

## 2016-06-23 ENCOUNTER — Encounter: Payer: Self-pay | Admitting: Cardiology

## 2016-06-23 DIAGNOSIS — Z7901 Long term (current) use of anticoagulants: Secondary | ICD-10-CM

## 2016-06-23 DIAGNOSIS — I255 Ischemic cardiomyopathy: Secondary | ICD-10-CM

## 2016-06-23 DIAGNOSIS — I482 Chronic atrial fibrillation, unspecified: Secondary | ICD-10-CM

## 2016-06-23 LAB — POCT INR: INR: 2.4

## 2016-06-24 ENCOUNTER — Other Ambulatory Visit: Payer: Self-pay | Admitting: *Deleted

## 2016-06-24 DIAGNOSIS — N186 End stage renal disease: Secondary | ICD-10-CM | POA: Diagnosis not present

## 2016-06-24 DIAGNOSIS — D649 Anemia, unspecified: Secondary | ICD-10-CM | POA: Diagnosis not present

## 2016-06-24 DIAGNOSIS — N184 Chronic kidney disease, stage 4 (severe): Secondary | ICD-10-CM | POA: Diagnosis not present

## 2016-06-24 DIAGNOSIS — Z992 Dependence on renal dialysis: Secondary | ICD-10-CM | POA: Diagnosis not present

## 2016-06-24 DIAGNOSIS — N179 Acute kidney failure, unspecified: Secondary | ICD-10-CM | POA: Diagnosis not present

## 2016-06-24 DIAGNOSIS — N2581 Secondary hyperparathyroidism of renal origin: Secondary | ICD-10-CM | POA: Diagnosis not present

## 2016-06-24 LAB — CUP PACEART REMOTE DEVICE CHECK
Brady Statistic RV Percent Paced: 99 %
Date Time Interrogation Session: 20170720143005
HIGH POWER IMPEDANCE MEASURED VALUE: 38 Ohm
Implantable Lead Location: 753858
Implantable Lead Location: 753859
Implantable Lead Location: 753860
Implantable Lead Model: 148
Implantable Lead Model: 4196
Implantable Lead Serial Number: 125581
Lead Channel Impedance Value: 360 Ohm
Lead Channel Setting Pacing Amplitude: 2 V
Lead Channel Setting Pacing Pulse Width: 0.5 ms
Lead Channel Setting Pacing Pulse Width: 0.8 ms
Lead Channel Setting Sensing Sensitivity: 2 mV
MDC IDC LEAD IMPLANT DT: 20020906
MDC IDC LEAD IMPLANT DT: 20091026
MDC IDC LEAD IMPLANT DT: 20091026
MDC IDC MSMT LEADCHNL LV IMPEDANCE VALUE: 440 Ohm
MDC IDC MSMT LEADCHNL RV IMPEDANCE VALUE: 540 Ohm
MDC IDC MSMT LEADCHNL RV SENSING INTR AMPL: 11.8 mV
MDC IDC SET LEADCHNL RV PACING AMPLITUDE: 2.5 V
Pulse Gen Serial Number: 7238042

## 2016-06-24 NOTE — Patient Outreach (Signed)
Transition of care call attempt. No answer to phone. Left VM with RNCM contact, requested call back. Plan to attempt again later. [name]. [name], RN, BSN, [place] [phone]

## 2016-06-25 ENCOUNTER — Other Ambulatory Visit: Payer: Self-pay | Admitting: *Deleted

## 2016-06-25 DIAGNOSIS — K810 Acute cholecystitis: Secondary | ICD-10-CM | POA: Diagnosis not present

## 2016-06-25 DIAGNOSIS — Z933 Colostomy status: Secondary | ICD-10-CM | POA: Diagnosis not present

## 2016-06-25 DIAGNOSIS — E119 Type 2 diabetes mellitus without complications: Secondary | ICD-10-CM | POA: Diagnosis not present

## 2016-06-25 DIAGNOSIS — R109 Unspecified abdominal pain: Secondary | ICD-10-CM | POA: Diagnosis not present

## 2016-06-25 DIAGNOSIS — Z6829 Body mass index (BMI) 29.0-29.9, adult: Secondary | ICD-10-CM | POA: Diagnosis not present

## 2016-06-25 DIAGNOSIS — Z1389 Encounter for screening for other disorder: Secondary | ICD-10-CM | POA: Diagnosis not present

## 2016-06-25 NOTE — Patient Outreach (Signed)
Call to patient home and cell numbers. No answer, was able to leave voicemail with RNCM contact information. Plan to attempt again and if unable to reach will mail letter. [name]. [name], RN, BSN, [place] [phone]

## 2016-06-26 DIAGNOSIS — Z992 Dependence on renal dialysis: Secondary | ICD-10-CM | POA: Diagnosis not present

## 2016-06-26 DIAGNOSIS — N2581 Secondary hyperparathyroidism of renal origin: Secondary | ICD-10-CM | POA: Diagnosis not present

## 2016-06-26 DIAGNOSIS — N184 Chronic kidney disease, stage 4 (severe): Secondary | ICD-10-CM | POA: Diagnosis not present

## 2016-06-26 DIAGNOSIS — D649 Anemia, unspecified: Secondary | ICD-10-CM | POA: Diagnosis not present

## 2016-06-26 DIAGNOSIS — N179 Acute kidney failure, unspecified: Secondary | ICD-10-CM | POA: Diagnosis not present

## 2016-06-26 DIAGNOSIS — N186 End stage renal disease: Secondary | ICD-10-CM | POA: Diagnosis not present

## 2016-06-28 ENCOUNTER — Other Ambulatory Visit: Payer: Self-pay | Admitting: *Deleted

## 2016-06-28 ENCOUNTER — Ambulatory Visit (INDEPENDENT_AMBULATORY_CARE_PROVIDER_SITE_OTHER): Payer: Medicare Other | Admitting: *Deleted

## 2016-06-28 DIAGNOSIS — I255 Ischemic cardiomyopathy: Secondary | ICD-10-CM

## 2016-06-28 DIAGNOSIS — I482 Chronic atrial fibrillation, unspecified: Secondary | ICD-10-CM

## 2016-06-28 DIAGNOSIS — Z7901 Long term (current) use of anticoagulants: Secondary | ICD-10-CM | POA: Diagnosis not present

## 2016-06-28 LAB — POCT INR: INR: 1.4

## 2016-06-28 IMAGING — NM NM MYOCAR MULTI W/SPECT W/WALL MOTION & EF
2 series · 12 of 12 positions shown · non-contrast
Comparison: None.

CLINICAL DATA: 58-year-old male with a known history of coronary
artery disease referred for chest pain.

EXAM:
MYOCARDIAL IMAGING WITH SPECT (REST AND PHARMACOLOGIC-STRESS)
GATED LEFT VENTRICULAR WALL MOTION STUDY
LEFT VENTRICULAR EJECTION FRACTION
TECHNIQUE: Standard myocardial SPECT imaging was performed after resting
intravenous injection of 10 mCi Hc-IIm sestamibi. Subsequently,
intravenous infusion of Lexiscan was performed under the supervision
of the Cardiology staff. At peak effect of the drug, 30 mCi Hc-IIm
sestamibi was injected intravenously and standard myocardial SPECT
imaging was performed. Quantitative gated imaging was also performed
to evaluate left ventricular wall motion, and estimate left
ventricular ejection fraction.

[Series 1: rest · 8.28mm/px · 6 of 64 frames shown]
[frame 6/64]
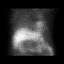
[frame 16/64]
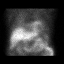
[frame 27/64]
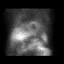
[frame 38/64]
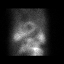
[frame 48/64]
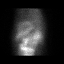
[frame 59/64]
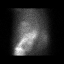

[Series 2: stress gated · 8.28mm/px · 6 of 512 frames shown]
[frame 43/512  full-range]
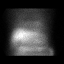
[frame 128/512  full-range]
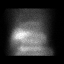
[frame 214/512  full-range]
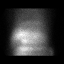
[frame 299/512  full-range]
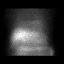
[frame 384/512  full-range]
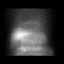
[frame 470/512  full-range]
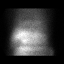

[12 of 12 positions shown; findings below may reference images not displayed]

FINDINGS: Pharmacological stress

Baseline EKG showed ventricular pacing. After injection heart rate
increased from 78 beats per min up to 100 beats per min, and blood
pressure increased from 81/58 up to 104/65. The test was stopped
after injection was completed, the patient did not experience any
chest pain. Post-injection EKG showed continued ventricular pacing,
non interpretable for ischemia. There were no significant
arrhythmias.

Perfusion: There is a large severe intensity fixed defect extending
from the mid anterior wall to the entire apex. There is a medium
sized moderate intensity fixed inferior and inferoapical defect.
Summed stress score 34, summed resting score 33, summed difference
score 1

Wall Motion: There is global hypokinesis. The left ventricle is
enlarged with end-diastolic volume 288 mL.

Left Ventricular Ejection Fraction: 18 %

End diastolic volume to 88 ml

End systolic volume 236 ml
IMPRESSION: 1. Large areas of prior anterior, apical, and inferior infarct. No
current ischemia.

2. There was global hypokinesis.  The left ventricle was enlarged

3. Left ventricular ejection fraction 18%

4. High-risk stress test findings*. High risk findings due to
severely decreased ejection fraction and multiple areas of prior
infarct. There is no current myocardium at jeopardy.

*2062 Appropriate Use Criteria for Coronary Revascularization
Focused Update: J Am Coll Cardiol. 2062;59(9):857-881.
[URL]

## 2016-06-28 MED ORDER — WARFARIN SODIUM 1 MG PO TABS
1.0000 mg | ORAL_TABLET | Freq: Every day | ORAL | 3 refills | Status: DC
Start: 2016-06-28 — End: 2016-07-12

## 2016-06-28 NOTE — Patient Outreach (Signed)
Call to patient for transition of care No answer to either home or cell number. Left message requesting call back. Plan to mail unable to reach letter. [name]. [name], RN, BSN, [place] [phone]

## 2016-06-29 DIAGNOSIS — N2581 Secondary hyperparathyroidism of renal origin: Secondary | ICD-10-CM | POA: Diagnosis not present

## 2016-06-29 DIAGNOSIS — D649 Anemia, unspecified: Secondary | ICD-10-CM | POA: Diagnosis not present

## 2016-06-29 DIAGNOSIS — N186 End stage renal disease: Secondary | ICD-10-CM | POA: Diagnosis not present

## 2016-06-29 DIAGNOSIS — N184 Chronic kidney disease, stage 4 (severe): Secondary | ICD-10-CM | POA: Diagnosis not present

## 2016-06-29 DIAGNOSIS — Z992 Dependence on renal dialysis: Secondary | ICD-10-CM | POA: Diagnosis not present

## 2016-06-29 DIAGNOSIS — N179 Acute kidney failure, unspecified: Secondary | ICD-10-CM | POA: Diagnosis not present

## 2016-06-30 ENCOUNTER — Ambulatory Visit (INDEPENDENT_AMBULATORY_CARE_PROVIDER_SITE_OTHER): Payer: Medicare Other | Admitting: *Deleted

## 2016-06-30 ENCOUNTER — Encounter: Payer: Self-pay | Admitting: Internal Medicine

## 2016-06-30 ENCOUNTER — Telehealth: Payer: Self-pay | Admitting: *Deleted

## 2016-06-30 DIAGNOSIS — Z95810 Presence of automatic (implantable) cardiac defibrillator: Secondary | ICD-10-CM

## 2016-06-30 LAB — CUP PACEART INCLINIC DEVICE CHECK
Date Time Interrogation Session: 20170726144809
HIGH POWER IMPEDANCE MEASURED VALUE: 37.3533
Implantable Lead Implant Date: 20020906
Implantable Lead Implant Date: 20091026
Implantable Lead Location: 753858
Implantable Lead Location: 753860
Implantable Lead Model: 4196
Implantable Lead Serial Number: 125581
Lead Channel Impedance Value: 575 Ohm
Lead Channel Pacing Threshold Amplitude: 0.5 V
Lead Channel Pacing Threshold Pulse Width: 0.5 ms
Lead Channel Sensing Intrinsic Amplitude: 11.8 mV
Lead Channel Setting Sensing Sensitivity: 2 mV
MDC IDC LEAD IMPLANT DT: 20091026
MDC IDC LEAD LOCATION: 753859
MDC IDC MSMT BATTERY REMAINING LONGEVITY: 62.4
MDC IDC MSMT LEADCHNL LV IMPEDANCE VALUE: 450 Ohm
MDC IDC MSMT LEADCHNL RA IMPEDANCE VALUE: 362.5 Ohm
MDC IDC MSMT LEADCHNL RA SENSING INTR AMPL: 1.9 mV
MDC IDC SET LEADCHNL LV PACING AMPLITUDE: 2 V
MDC IDC SET LEADCHNL LV PACING PULSEWIDTH: 0.5 ms
MDC IDC SET LEADCHNL RV PACING AMPLITUDE: 2.5 V
MDC IDC SET LEADCHNL RV PACING PULSEWIDTH: 0.8 ms
MDC IDC STAT BRADY RA PERCENT PACED: 0 %
MDC IDC STAT BRADY RV PERCENT PACED: 99.77 %
Pulse Gen Serial Number: 7238042

## 2016-06-30 NOTE — Progress Notes (Signed)
CRTD check in clinic. [name] was concerned about any ventricular episodes because he reports hearing "v-tach" when the nurses were giving report while he was recently hospitalized. No ventricular arrhythmias. 100% AT/AF- on warfarin. >99% BiV pacing. Remote monitoring as scheduled (09/21/16) and ROV with GT/R in April 2018.

## 2016-06-30 NOTE — Telephone Encounter (Signed)
LMOM on home and mobile numbers to call back regarding appt today.

## 2016-07-01 ENCOUNTER — Ambulatory Visit (INDEPENDENT_AMBULATORY_CARE_PROVIDER_SITE_OTHER): Payer: Medicare Other | Admitting: *Deleted

## 2016-07-01 DIAGNOSIS — N2581 Secondary hyperparathyroidism of renal origin: Secondary | ICD-10-CM | POA: Diagnosis not present

## 2016-07-01 DIAGNOSIS — D649 Anemia, unspecified: Secondary | ICD-10-CM | POA: Diagnosis not present

## 2016-07-01 DIAGNOSIS — I255 Ischemic cardiomyopathy: Secondary | ICD-10-CM | POA: Diagnosis not present

## 2016-07-01 DIAGNOSIS — Z7901 Long term (current) use of anticoagulants: Secondary | ICD-10-CM | POA: Diagnosis not present

## 2016-07-01 DIAGNOSIS — N184 Chronic kidney disease, stage 4 (severe): Secondary | ICD-10-CM | POA: Diagnosis not present

## 2016-07-01 DIAGNOSIS — N186 End stage renal disease: Secondary | ICD-10-CM | POA: Diagnosis not present

## 2016-07-01 DIAGNOSIS — N179 Acute kidney failure, unspecified: Secondary | ICD-10-CM | POA: Diagnosis not present

## 2016-07-01 DIAGNOSIS — I482 Chronic atrial fibrillation, unspecified: Secondary | ICD-10-CM

## 2016-07-01 DIAGNOSIS — Z992 Dependence on renal dialysis: Secondary | ICD-10-CM | POA: Diagnosis not present

## 2016-07-01 LAB — POCT INR: INR: 1.6

## 2016-07-02 ENCOUNTER — Other Ambulatory Visit (HOSPITAL_COMMUNITY): Payer: Self-pay | Admitting: *Deleted

## 2016-07-02 DIAGNOSIS — Z992 Dependence on renal dialysis: Secondary | ICD-10-CM | POA: Diagnosis not present

## 2016-07-02 DIAGNOSIS — N2581 Secondary hyperparathyroidism of renal origin: Secondary | ICD-10-CM | POA: Diagnosis not present

## 2016-07-02 DIAGNOSIS — I5022 Chronic systolic (congestive) heart failure: Secondary | ICD-10-CM

## 2016-07-02 DIAGNOSIS — N184 Chronic kidney disease, stage 4 (severe): Secondary | ICD-10-CM | POA: Diagnosis not present

## 2016-07-02 DIAGNOSIS — N179 Acute kidney failure, unspecified: Secondary | ICD-10-CM | POA: Diagnosis not present

## 2016-07-02 DIAGNOSIS — D649 Anemia, unspecified: Secondary | ICD-10-CM | POA: Diagnosis not present

## 2016-07-02 DIAGNOSIS — N186 End stage renal disease: Secondary | ICD-10-CM | POA: Diagnosis not present

## 2016-07-02 MED ORDER — METOPROLOL SUCCINATE ER 25 MG PO TB24: 12.5000 mg | ORAL_TABLET | Freq: Two times a day (BID) | ORAL | 3 refills | Status: DC

## 2016-07-04 IMAGING — DX DG CHEST 2V
2 series · 2 of 2 positions shown · non-contrast
Comparison: 01/10/2015

CLINICAL DATA: Shortness of breath and cough

EXAM:
CHEST  2 VIEW

[chest lat]
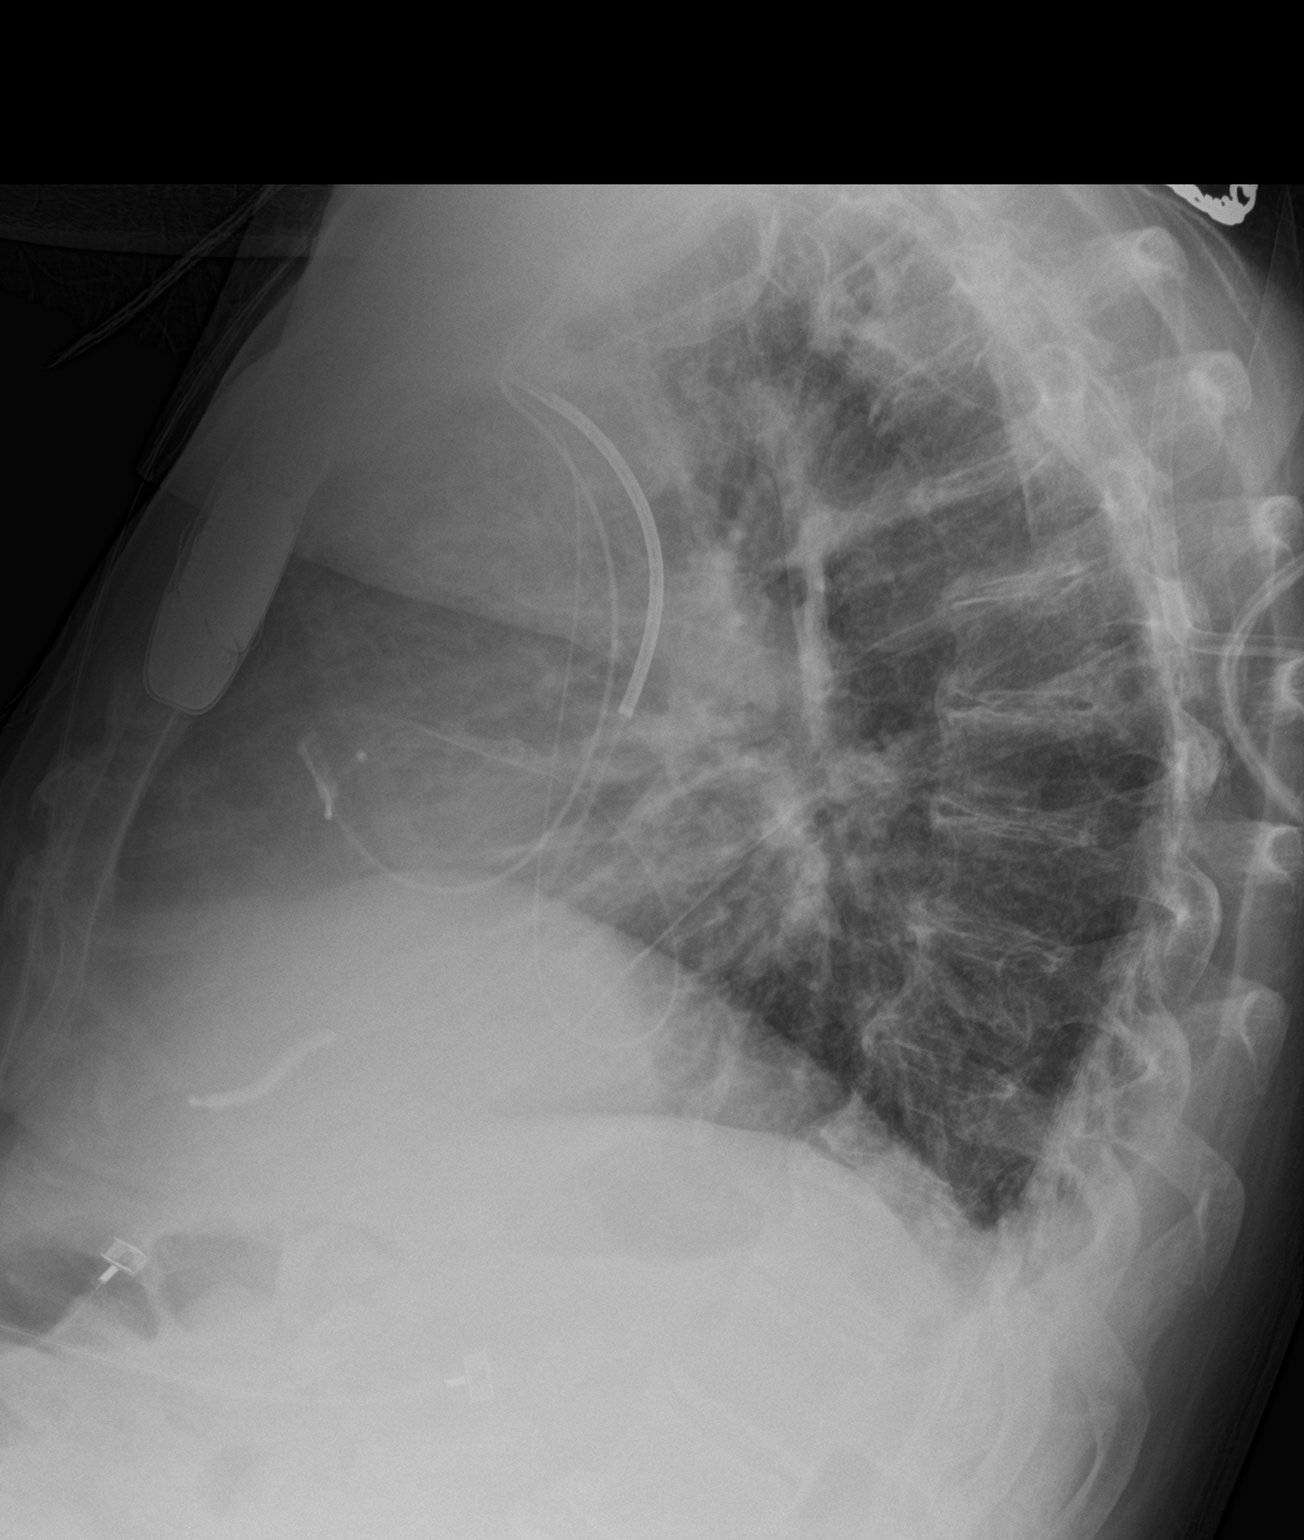

[chest ap strecther]
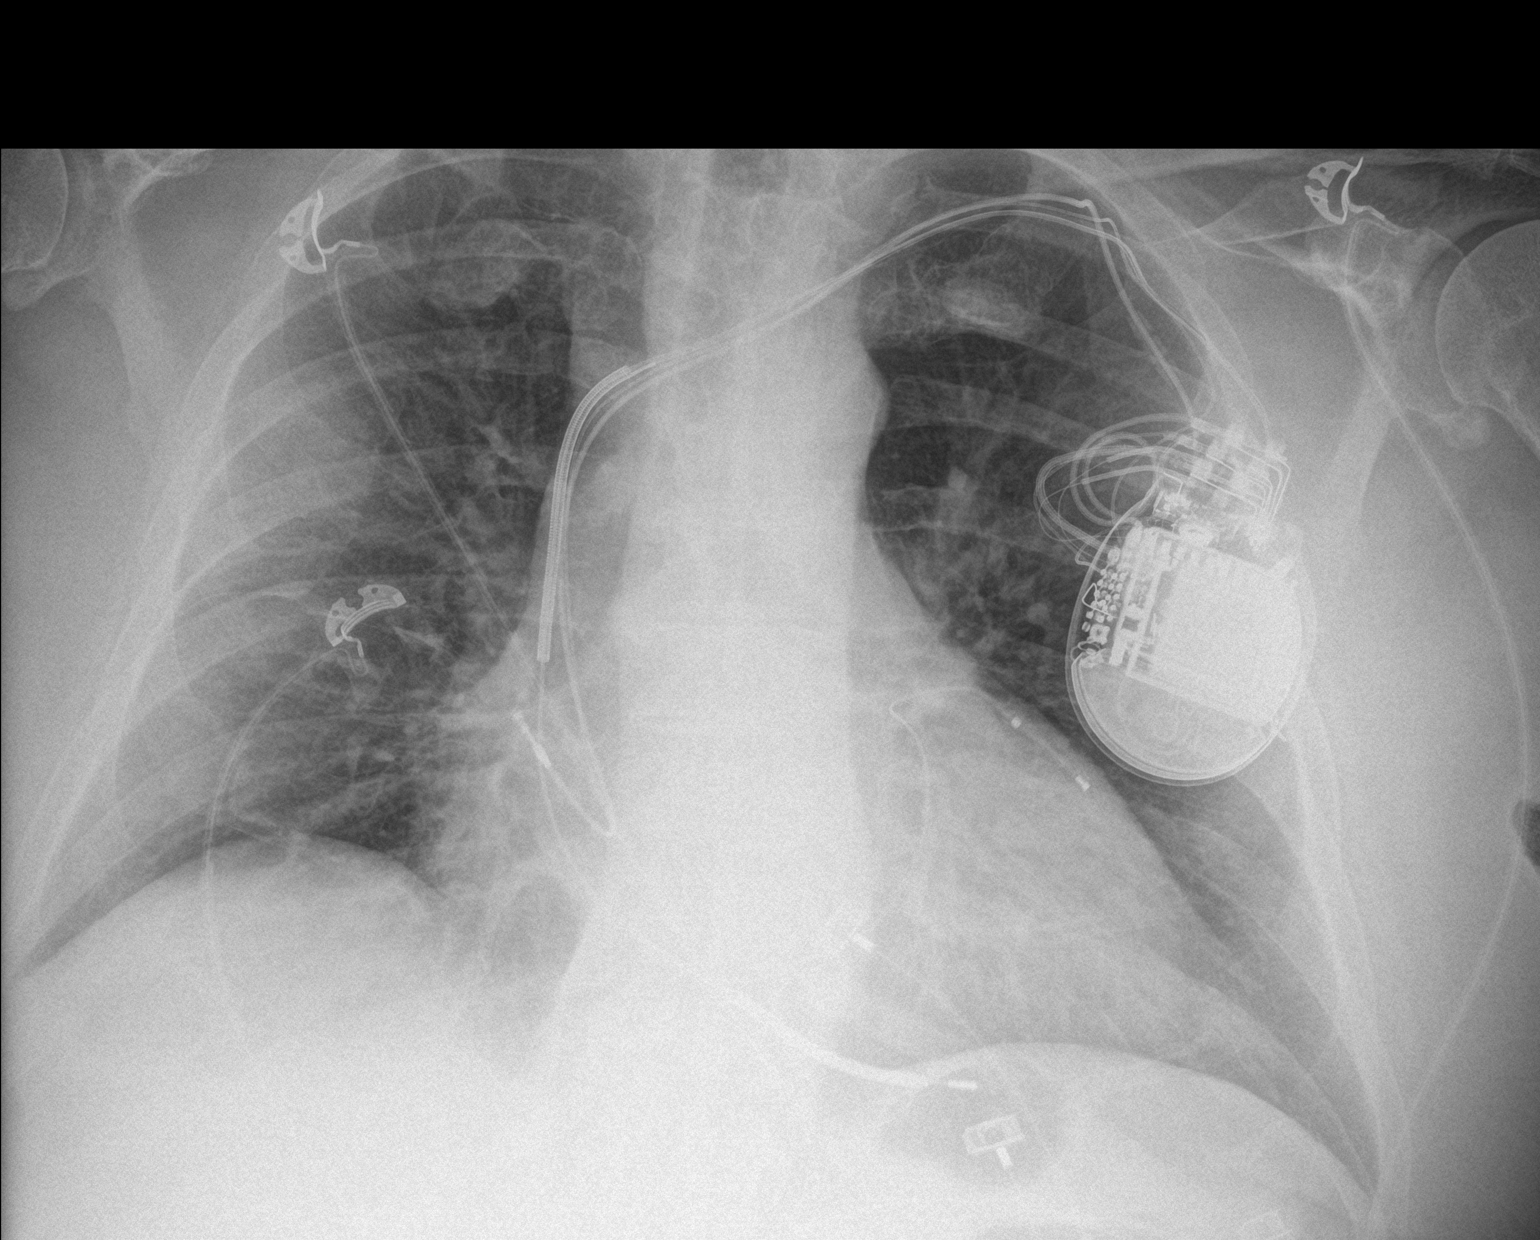

[2 of 2 positions shown; findings below may reference images not displayed]

FINDINGS: Cardiac shadow is enlarged but stable. A defibrillator is again seen
and stable. The lungs are well-aerated without focal infiltrate or
sizable effusion. No acute bony abnormality is seen.
IMPRESSION: No active cardiopulmonary disease.

## 2016-07-05 DIAGNOSIS — N186 End stage renal disease: Secondary | ICD-10-CM | POA: Diagnosis not present

## 2016-07-05 DIAGNOSIS — Z992 Dependence on renal dialysis: Secondary | ICD-10-CM | POA: Diagnosis not present

## 2016-07-05 DIAGNOSIS — N179 Acute kidney failure, unspecified: Secondary | ICD-10-CM | POA: Diagnosis not present

## 2016-07-07 ENCOUNTER — Encounter: Payer: Self-pay | Admitting: Cardiology

## 2016-07-07 DIAGNOSIS — N179 Acute kidney failure, unspecified: Secondary | ICD-10-CM | POA: Diagnosis not present

## 2016-07-12 ENCOUNTER — Ambulatory Visit (INDEPENDENT_AMBULATORY_CARE_PROVIDER_SITE_OTHER): Payer: Medicare Other | Admitting: *Deleted

## 2016-07-12 DIAGNOSIS — Z7901 Long term (current) use of anticoagulants: Secondary | ICD-10-CM

## 2016-07-12 DIAGNOSIS — I482 Chronic atrial fibrillation, unspecified: Secondary | ICD-10-CM

## 2016-07-12 DIAGNOSIS — I255 Ischemic cardiomyopathy: Secondary | ICD-10-CM | POA: Diagnosis not present

## 2016-07-12 LAB — POCT INR: INR: 2

## 2016-07-12 MED ORDER — WARFARIN SODIUM 2 MG PO TABS: 2.0000 mg | ORAL_TABLET | Freq: Every day | ORAL | 3 refills | Status: DC

## 2016-07-13 DIAGNOSIS — N189 Chronic kidney disease, unspecified: Secondary | ICD-10-CM | POA: Diagnosis not present

## 2016-07-13 DIAGNOSIS — N179 Acute kidney failure, unspecified: Secondary | ICD-10-CM | POA: Diagnosis not present

## 2016-07-13 DIAGNOSIS — N184 Chronic kidney disease, stage 4 (severe): Secondary | ICD-10-CM | POA: Diagnosis not present

## 2016-07-13 DIAGNOSIS — D649 Anemia, unspecified: Secondary | ICD-10-CM | POA: Diagnosis not present

## 2016-07-13 DIAGNOSIS — D509 Iron deficiency anemia, unspecified: Secondary | ICD-10-CM | POA: Diagnosis not present

## 2016-07-14 ENCOUNTER — Encounter: Payer: Self-pay | Admitting: *Deleted

## 2016-07-14 ENCOUNTER — Other Ambulatory Visit: Payer: Self-pay | Admitting: *Deleted

## 2016-07-14 NOTE — Patient Outreach (Signed)
No response from patient after unable to reach letter sent and multiple outreach call attempts. Will close out case per protocol. RNCM signing off case. [name]. [name], RN, BSN, [place] [phone]

## 2016-07-15 DIAGNOSIS — D509 Iron deficiency anemia, unspecified: Secondary | ICD-10-CM | POA: Diagnosis not present

## 2016-07-15 DIAGNOSIS — N179 Acute kidney failure, unspecified: Secondary | ICD-10-CM | POA: Diagnosis not present

## 2016-07-15 DIAGNOSIS — N189 Chronic kidney disease, unspecified: Secondary | ICD-10-CM | POA: Diagnosis not present

## 2016-07-15 DIAGNOSIS — D649 Anemia, unspecified: Secondary | ICD-10-CM | POA: Diagnosis not present

## 2016-07-15 DIAGNOSIS — N184 Chronic kidney disease, stage 4 (severe): Secondary | ICD-10-CM | POA: Diagnosis not present

## 2016-07-16 ENCOUNTER — Ambulatory Visit (HOSPITAL_COMMUNITY)
Admission: RE | Admit: 2016-07-16 | Discharge: 2016-07-16 | Disposition: A | Discharge status: Home / Self Care | Payer: Medicare Other | Source: Ambulatory Visit | Attending: Internal Medicine | Admitting: Internal Medicine

## 2016-07-16 ENCOUNTER — Encounter (HOSPITAL_COMMUNITY): Payer: Self-pay | Admitting: Internal Medicine

## 2016-07-16 VITALS — BP 110/66 | HR 82 | Ht 70.0 in | Wt 182.8 lb

## 2016-07-16 DIAGNOSIS — Z923 Personal history of irradiation: Secondary | ICD-10-CM | POA: Insufficient documentation

## 2016-07-16 DIAGNOSIS — Z9221 Personal history of antineoplastic chemotherapy: Secondary | ICD-10-CM | POA: Diagnosis not present

## 2016-07-16 DIAGNOSIS — Z8249 Family history of ischemic heart disease and other diseases of the circulatory system: Secondary | ICD-10-CM | POA: Insufficient documentation

## 2016-07-16 DIAGNOSIS — Z955 Presence of coronary angioplasty implant and graft: Secondary | ICD-10-CM | POA: Insufficient documentation

## 2016-07-16 DIAGNOSIS — I252 Old myocardial infarction: Secondary | ICD-10-CM | POA: Insufficient documentation

## 2016-07-16 DIAGNOSIS — Z8546 Personal history of malignant neoplasm of prostate: Secondary | ICD-10-CM | POA: Insufficient documentation

## 2016-07-16 DIAGNOSIS — K811 Chronic cholecystitis: Secondary | ICD-10-CM | POA: Insufficient documentation

## 2016-07-16 DIAGNOSIS — I2510 Atherosclerotic heart disease of native coronary artery without angina pectoris: Secondary | ICD-10-CM | POA: Insufficient documentation

## 2016-07-16 DIAGNOSIS — N186 End stage renal disease: Secondary | ICD-10-CM

## 2016-07-16 DIAGNOSIS — K219 Gastro-esophageal reflux disease without esophagitis: Secondary | ICD-10-CM | POA: Insufficient documentation

## 2016-07-16 DIAGNOSIS — Z794 Long term (current) use of insulin: Secondary | ICD-10-CM | POA: Insufficient documentation

## 2016-07-16 DIAGNOSIS — I482 Chronic atrial fibrillation: Secondary | ICD-10-CM | POA: Insufficient documentation

## 2016-07-16 DIAGNOSIS — G4733 Obstructive sleep apnea (adult) (pediatric): Secondary | ICD-10-CM | POA: Diagnosis not present

## 2016-07-16 DIAGNOSIS — I5022 Chronic systolic (congestive) heart failure: Secondary | ICD-10-CM | POA: Diagnosis not present

## 2016-07-16 DIAGNOSIS — Z7901 Long term (current) use of anticoagulants: Secondary | ICD-10-CM | POA: Insufficient documentation

## 2016-07-16 DIAGNOSIS — I255 Ischemic cardiomyopathy: Secondary | ICD-10-CM | POA: Diagnosis not present

## 2016-07-16 DIAGNOSIS — Z85048 Personal history of other malignant neoplasm of rectum, rectosigmoid junction, and anus: Secondary | ICD-10-CM | POA: Insufficient documentation

## 2016-07-16 DIAGNOSIS — N185 Chronic kidney disease, stage 5: Secondary | ICD-10-CM | POA: Insufficient documentation

## 2016-07-16 DIAGNOSIS — I132 Hypertensive heart and chronic kidney disease with heart failure and with stage 5 chronic kidney disease, or end stage renal disease: Secondary | ICD-10-CM | POA: Diagnosis not present

## 2016-07-16 DIAGNOSIS — E785 Hyperlipidemia, unspecified: Secondary | ICD-10-CM | POA: Insufficient documentation

## 2016-07-16 DIAGNOSIS — Z8673 Personal history of transient ischemic attack (TIA), and cerebral infarction without residual deficits: Secondary | ICD-10-CM | POA: Diagnosis not present

## 2016-07-16 DIAGNOSIS — I480 Paroxysmal atrial fibrillation: Secondary | ICD-10-CM | POA: Diagnosis not present

## 2016-07-16 DIAGNOSIS — Z79899 Other long term (current) drug therapy: Secondary | ICD-10-CM | POA: Insufficient documentation

## 2016-07-16 DIAGNOSIS — Z933 Colostomy status: Secondary | ICD-10-CM | POA: Insufficient documentation

## 2016-07-16 DIAGNOSIS — E1122 Type 2 diabetes mellitus with diabetic chronic kidney disease: Secondary | ICD-10-CM | POA: Diagnosis not present

## 2016-07-16 NOTE — Patient Instructions (Signed)
Will schedule you for an echocardiogram at [facility]. Address: [address] ([place]), [place], [place] [address]  Phone: [phone]  Follow up 3 months with Dr. Haroldine Laws.  Do the following things EVERYDAY: 1) Weigh yourself in the morning before breakfast. Write it down and keep it in a log. 2) Take your medicines as prescribed 3) Eat low salt foods-Limit salt (sodium) to 2000 mg per day.  4) Stay as active as you can everyday 5) Limit all fluids for the day to less than 2 liters

## 2016-07-16 NOTE — Addendum Note (Signed)
Encounter addended by: Effie Berkshire, RN on: 07/16/2016 11:27 AM<BR>    Actions taken: Order Entry activity accessed, Diagnosis association updated, Follow-up modified, Sign clinical note

## 2016-07-16 NOTE — Progress Notes (Signed)
Patient ID: [name], male   DOB: 1955-12-13, 60 y.o.   MRN: [id number]   ADVANCED HF CLINIC NOTE  PCP: Dr. Gerarda Fraction Oncologist: Dr Benay Spice.  CHF: Bensimhon  60 y.o. with history of CAD and systolic HF due to ischemic cardiomyopathy EF 20-25%, BiV ICD upgrade 2009, chronic atrial fibrillation s/p AVN ablation 9/16, CKD, and traumatic SAH in 1/16 after a fall.  He has been followed at the heart failure clinic at [facility] by Dr Carolynn Serve in the past.    He was admitted in 3/16 from the [facility] ER with exertional dyspnea/volume overload and had a cardiac arrest/ventricular fibrillation while in the hospital terminated by his ICD.  After diuresis, RHC showed relatively normal filling pressures and preserved cardiac index.  Creatinine peaked at 3.0. (was 5.0 in 1/16).  Echo in 3/16 showed EF 25-30% with restrictive diastolic function.  Cardiolite was done, showing areas of scar but no ischemia.  EF 18%.    Admitted 4/16 with hematuria and elevated LFTs. Questionable amiodarone toxicity and this was stopped.   In 5/16 Underwent elective BiV ICD generator change which went without complication. He was admitted again 04/23/15 with acute on chronic CHF and respiratory failure. During that admission he ruled in for a NSTEMI- Troponin 4.29. He has chronic stage 4 CKD and it was decided to obtain a Myoview before considering a cath. Myoview 04/26/15 showed EF 20% with large region of prior infarct involving the apical anterior, anteroseptal, inferoseptal and inferior walls with extension to the true apex. Small region of reversible/inducible ischemia along the margin of the prior infarct at the mid ventricular anterior and anteroseptal walls. No change from previous.   Admitted to [facility] in 8/16 for increased dyspnea and volume overload. Diuresed with IV lasix and transitioned to 40 mg daily. D/C weight  200 pounds.  Redmitted in 9/16 with bleeding from stoma site and atrial fibrillation with RVR.  Eliquis was  held and bleeding stopped.  Eliquis was restarted prior to discharge. He was noted to have < 50% BiV pacing and also to be very volume overloaded. He was then diuresed and had AV nodal ablation.    Readmitted 10/16 with volume overload and worsening renal failure.. Diuresed 32 pounds. Went home 189 pounds. Switched to torsemide on d/c 80/40  Admitted 6/4 -05/12/16 with Abd/chest pain.  Cardiac work up negative. ABD [place] with Cholelithiasis without acute cholecystitis and cirrhosis without ascites. Pt declined HIDA scan. No surgery planned.  Plan on percutaneous drain placement as outpatient if patient were to become symptomatic.  Echo 05/11/16 with LVEF 10-15%, mild AI, mild MR, normal RV. Discharge weight 188 lbs.   Readmitted 7/17 with cholangitis and sepsis. Required ERCP. C/b acute on chronic renal failure requiring HD. Feeling better. Remains on HD T,Th,Sat but still making urine. Dr. Hinda Lenis is going to do 24 hour urine to see if he has enough recovery of kidney function to stop HD. Taking torsemide 62 daily. Breathing better. No edema, orthopnea, or PND.   Corevue: MOD not functioning properly, could not interrogate.    Labs (12/15): LDL 123 Labs (3/16): K 3.8, creatinine 2.78 Labs (03/10/2015): K 4.5 Creatinine 2.49  TSH 4.58, LFTs normal, digoxin 0.8 Labs (04/26/15): K 3.5 creatinine 2.13 Labs (08/04/2015): K 3.3 Creatinine 3.01  Labs (9/16): K 4.1, creatinine 2.7, HCT 30.8 Labs (4/17): K 3.5, creatinine 2.67 Labs (05/12/16): K 3.7, creatinine 2.66  PMH: 1. HTN 2. Type II diabetes 3. CAD: s/p BMS LAD in 2001,  PTCA ramus and BMS LAD in 2009.  Cardiolite (3/16) with EF 18%, no ischemia, prior anterior, apical and inferior infarction. NSTEMI in 5/16.  Myoview done 04/26/15 showed EF 20% with large region of prior infarct involving the apical anterior, anteroseptal, inferoseptal and inferior walls with extension to the true apex. Small region of reversible/inducible ischemia along the margin of the  prior infarct at the mid ventricular anterior and anteroseptal walls. No change from previous.  4. Chronic systolic CHF: Ischemic CMP.  St Jude CRT-D.  Echo (3/16) with EF 25-30%, restrictive diastolic function, mild LVH, mild MR.  RHC (3/16) with mean RA 9, PA 47/29, mean PCWP 16, CI 2.5 (Fick).  Suspect ACEI cough. Echo (7/17) with EF 15-20%. Myoview 18%.   5. SAH: 1/16 after fall (traumatic).   6. GERD 7. Atrial fibrillation: Paroxysmal initially but now chronic.  Now s/p AV nodal ablation to allow BiV pacing. 8. Rectal cancer: s/p surgery. Has colostomy.  9. Prostate cancer: s/p chemo/radiation.  10. H/o TIA 11. OSA: On CPAP.  12. Hyperlipidemia 13. CKD stage IV: Followed by Dr Lowanda Foster.  14. Bleeding from stoma.   SH: Married, lives in [place], nonsmoker.   FH: CAD  ROS: All systems reviewed and negative except as per HPI.   Current Outpatient Prescriptions  Medication Sig Dispense Refill  . atorvastatin (LIPITOR) 80 MG tablet Take 1 tablet (80 mg total) by mouth daily. 30 tablet 6  . co-enzyme Q-10 50 MG capsule Take 50 mg by mouth every morning.     . ferrous sulfate 325 (65 FE) MG tablet Take 1 tablet (325 mg total) by mouth 2 (two) times daily with a meal. 60 tablet 0  . insulin glargine (LANTUS) 100 UNIT/ML injection Inject 0.15 mLs (15 Units total) into the skin every morning. 10 mL 11  . metoprolol succinate (TOPROL-XL) 25 MG 24 hr tablet Take 0.5 tablets (12.5 mg total) by mouth 2 (two) times daily. 45 tablet 3  . nitroGLYCERIN (NITROLINGUAL) 0.4 MG/SPRAY spray Place 1 spray under the tongue every 5 (five) minutes x 3 doses as needed for chest pain. Reported on 06/01/2016    . Omega-3 Fatty Acids (FISH OIL) 1000 MG CAPS Take 1,000 mg by mouth 2 (two) times daily.    . pantoprazole (PROTONIX) 40 MG tablet Take 1 tablet (40 mg total) by mouth daily. 30 tablet 0  . PROAIR RESPICLICK [id number] (90 BASE) MCG/ACT AEPB Inhale 2 puffs into the lungs every 6 (six) hours as needed (for  breathing).   0  . torsemide (DEMADEX) 20 MG tablet Take 80 mg by mouth daily.    . traMADol (ULTRAM) 50 MG tablet Take 1 tablet (50 mg total) by mouth every 6 (six) hours as needed. 30 tablet 1  . warfarin (COUMADIN) 2 MG tablet Take 1 tablet (2 mg total) by mouth daily at 6 PM. 30 tablet 3   No current facility-administered medications for this encounter.     Vitals:   07/16/16 1032  BP: 110/66  Pulse: 82  SpO2: 100%  Weight: 182 lb 12.8 oz (82.9 kg)  Height: 5\' 10"  (1.778 m)    Wt Readings from Last 3 Encounters:  07/16/16 182 lb 12.8 oz (82.9 kg)  06/21/16 182 lb 11.2 oz (82.9 kg)  06/01/16 184 lb 9.6 oz (83.7 kg)    General: NAD Ambulated in the clinic without difficutly.  Neck: JVP 5 cm.   no thyromegaly or thyroid nodule.  Lungs: CTAB, normal effort.  CV: Nondisplaced  PMI.  Regular S1S2, 2/6 SEM RUSB.  No peripheral edema.  No carotid bruit.  Normal pedal pulses.  Abdomen: Soft, mildly tender, ND, no HSM appreciated. S/p colostomy.  Skin: Intact without lesions or rashes.  Neurologic: Alert and oriented x 3.  Psych: Normal affect. Extremities: No clubbing or cyanosis.  RUE AVF.  HEENT: Normal.   Assessment/Plan: 1. Chronic systolic CHF: Ischemic cardiomyopathy. Echo 05/11/16 with LVEF 10-15%, mild AI, mild MR, normal RV.  Has been followed for a long time at [facility].  Deemed not transplant or VAD candidate with comorbities and advanced CKD. Has St Jude CRT-D, now s/p AV nodal ablation to promote BiV pacing.  Breathing much better since AV nodal ablation (NYHA class II) - Volume status stable with torsemide and ESRD. - Continue Toprol 12.5 bid. (dose reduced with recent hypotension) - No ACEI, spironolactone, digoxin with advanced CKD.   - Will keep off hydral/NTG for now.  - Given CKD and previous cancer not good VAD or transplant candidate (would need heart/kidney)  2. Atrial fibrillation: Chronic. CHADS2Vasc Score 5. s/p AV nodal ablation - Feels great s/p AV node  ablation.  - Now off Eliquis and on warfarin with ESRD. If kidneys recover some can go back to Eliquis.  3. CAD:  NSTEMI in 5/16.  - Cardiolite without high ischemic burden. EF is decreased from previous, but poor cath candidate with renal failure. - No ASA given AC - Cont statin and Toprol XL.  4. CKD: Stage V. Now on ESRD but may have some kidney recovery - Followed by Dr Lowanda Foster in [place].  5.  DM2: followed by Dr. Gerarda Fraction 7. H/O rectal cancer 2008: Has diverting colostomy.  He has follow up with Dr Benay Spice. - Recent CT (05/02/16) with no evidence of recurrence. 8. Hyperlipidemia: - Continue statin  9. Chronic Cholecystitis with recent cholangitis and biliary stent -- Ideally needs cholecystectomy. He is at moderate to high risk (but not prohibitive) for peri-op CV complications. Will repeat echo. IF EF stable will d/w GSU and try to coordinate.   Glori Bickers, MD 07/16/16

## 2016-07-17 DIAGNOSIS — N179 Acute kidney failure, unspecified: Secondary | ICD-10-CM | POA: Diagnosis not present

## 2016-07-17 DIAGNOSIS — N189 Chronic kidney disease, unspecified: Secondary | ICD-10-CM | POA: Diagnosis not present

## 2016-07-17 DIAGNOSIS — D509 Iron deficiency anemia, unspecified: Secondary | ICD-10-CM | POA: Diagnosis not present

## 2016-07-17 DIAGNOSIS — D649 Anemia, unspecified: Secondary | ICD-10-CM | POA: Diagnosis not present

## 2016-07-17 DIAGNOSIS — N184 Chronic kidney disease, stage 4 (severe): Secondary | ICD-10-CM | POA: Diagnosis not present

## 2016-07-20 ENCOUNTER — Other Ambulatory Visit: Payer: Self-pay | Admitting: Cardiology

## 2016-07-20 DIAGNOSIS — N189 Chronic kidney disease, unspecified: Secondary | ICD-10-CM | POA: Diagnosis not present

## 2016-07-20 DIAGNOSIS — D509 Iron deficiency anemia, unspecified: Secondary | ICD-10-CM | POA: Diagnosis not present

## 2016-07-20 DIAGNOSIS — N184 Chronic kidney disease, stage 4 (severe): Secondary | ICD-10-CM | POA: Diagnosis not present

## 2016-07-20 DIAGNOSIS — N179 Acute kidney failure, unspecified: Secondary | ICD-10-CM | POA: Diagnosis not present

## 2016-07-20 DIAGNOSIS — D649 Anemia, unspecified: Secondary | ICD-10-CM | POA: Diagnosis not present

## 2016-07-21 ENCOUNTER — Encounter: Payer: Self-pay | Admitting: Internal Medicine

## 2016-07-22 ENCOUNTER — Ambulatory Visit (INDEPENDENT_AMBULATORY_CARE_PROVIDER_SITE_OTHER): Payer: Medicare Other | Admitting: *Deleted

## 2016-07-22 DIAGNOSIS — D649 Anemia, unspecified: Secondary | ICD-10-CM | POA: Diagnosis not present

## 2016-07-22 DIAGNOSIS — I255 Ischemic cardiomyopathy: Secondary | ICD-10-CM

## 2016-07-22 DIAGNOSIS — I482 Chronic atrial fibrillation, unspecified: Secondary | ICD-10-CM

## 2016-07-22 DIAGNOSIS — Z7901 Long term (current) use of anticoagulants: Secondary | ICD-10-CM

## 2016-07-22 DIAGNOSIS — D509 Iron deficiency anemia, unspecified: Secondary | ICD-10-CM | POA: Diagnosis not present

## 2016-07-22 DIAGNOSIS — N189 Chronic kidney disease, unspecified: Secondary | ICD-10-CM | POA: Diagnosis not present

## 2016-07-22 DIAGNOSIS — N179 Acute kidney failure, unspecified: Secondary | ICD-10-CM | POA: Diagnosis not present

## 2016-07-22 DIAGNOSIS — N184 Chronic kidney disease, stage 4 (severe): Secondary | ICD-10-CM | POA: Diagnosis not present

## 2016-07-22 LAB — POCT INR: INR: 1.5

## 2016-07-26 ENCOUNTER — Ambulatory Visit (INDEPENDENT_AMBULATORY_CARE_PROVIDER_SITE_OTHER): Payer: Medicare Other | Admitting: *Deleted

## 2016-07-26 DIAGNOSIS — N189 Chronic kidney disease, unspecified: Secondary | ICD-10-CM | POA: Diagnosis not present

## 2016-07-26 DIAGNOSIS — D649 Anemia, unspecified: Secondary | ICD-10-CM | POA: Diagnosis not present

## 2016-07-26 DIAGNOSIS — I482 Chronic atrial fibrillation, unspecified: Secondary | ICD-10-CM

## 2016-07-26 DIAGNOSIS — Z7901 Long term (current) use of anticoagulants: Secondary | ICD-10-CM | POA: Diagnosis not present

## 2016-07-26 DIAGNOSIS — N179 Acute kidney failure, unspecified: Secondary | ICD-10-CM | POA: Diagnosis not present

## 2016-07-26 DIAGNOSIS — N184 Chronic kidney disease, stage 4 (severe): Secondary | ICD-10-CM | POA: Diagnosis not present

## 2016-07-26 DIAGNOSIS — D509 Iron deficiency anemia, unspecified: Secondary | ICD-10-CM | POA: Diagnosis not present

## 2016-07-26 DIAGNOSIS — I255 Ischemic cardiomyopathy: Secondary | ICD-10-CM | POA: Diagnosis not present

## 2016-07-26 LAB — POCT INR: INR: 2.1

## 2016-07-27 DIAGNOSIS — N184 Chronic kidney disease, stage 4 (severe): Secondary | ICD-10-CM | POA: Diagnosis not present

## 2016-07-27 DIAGNOSIS — N179 Acute kidney failure, unspecified: Secondary | ICD-10-CM | POA: Diagnosis not present

## 2016-07-27 DIAGNOSIS — N189 Chronic kidney disease, unspecified: Secondary | ICD-10-CM | POA: Diagnosis not present

## 2016-07-27 DIAGNOSIS — D649 Anemia, unspecified: Secondary | ICD-10-CM | POA: Diagnosis not present

## 2016-07-27 DIAGNOSIS — D509 Iron deficiency anemia, unspecified: Secondary | ICD-10-CM | POA: Diagnosis not present

## 2016-07-28 ENCOUNTER — Encounter: Payer: Self-pay | Admitting: Internal Medicine

## 2016-07-29 DIAGNOSIS — N184 Chronic kidney disease, stage 4 (severe): Secondary | ICD-10-CM | POA: Diagnosis not present

## 2016-07-29 DIAGNOSIS — N179 Acute kidney failure, unspecified: Secondary | ICD-10-CM | POA: Diagnosis not present

## 2016-07-29 DIAGNOSIS — D509 Iron deficiency anemia, unspecified: Secondary | ICD-10-CM | POA: Diagnosis not present

## 2016-07-29 DIAGNOSIS — D649 Anemia, unspecified: Secondary | ICD-10-CM | POA: Diagnosis not present

## 2016-07-29 DIAGNOSIS — N189 Chronic kidney disease, unspecified: Secondary | ICD-10-CM | POA: Diagnosis not present

## 2016-07-30 ENCOUNTER — Other Ambulatory Visit: Payer: Self-pay

## 2016-07-30 ENCOUNTER — Ambulatory Visit (HOSPITAL_COMMUNITY): Payer: Medicare Other | Attending: Cardiology

## 2016-07-30 DIAGNOSIS — I351 Nonrheumatic aortic (valve) insufficiency: Secondary | ICD-10-CM | POA: Insufficient documentation

## 2016-07-30 DIAGNOSIS — G4733 Obstructive sleep apnea (adult) (pediatric): Secondary | ICD-10-CM | POA: Insufficient documentation

## 2016-07-30 DIAGNOSIS — I255 Ischemic cardiomyopathy: Secondary | ICD-10-CM | POA: Diagnosis not present

## 2016-07-30 DIAGNOSIS — I77810 Thoracic aortic ectasia: Secondary | ICD-10-CM | POA: Diagnosis not present

## 2016-07-30 DIAGNOSIS — I5022 Chronic systolic (congestive) heart failure: Secondary | ICD-10-CM | POA: Insufficient documentation

## 2016-07-30 DIAGNOSIS — R29898 Other symptoms and signs involving the musculoskeletal system: Secondary | ICD-10-CM | POA: Insufficient documentation

## 2016-07-30 DIAGNOSIS — N189 Chronic kidney disease, unspecified: Secondary | ICD-10-CM | POA: Insufficient documentation

## 2016-07-30 DIAGNOSIS — I509 Heart failure, unspecified: Secondary | ICD-10-CM | POA: Diagnosis present

## 2016-07-30 DIAGNOSIS — I517 Cardiomegaly: Secondary | ICD-10-CM | POA: Diagnosis not present

## 2016-07-30 DIAGNOSIS — I2510 Atherosclerotic heart disease of native coronary artery without angina pectoris: Secondary | ICD-10-CM | POA: Diagnosis not present

## 2016-07-30 MED ORDER — PERFLUTREN LIPID MICROSPHERE
1.0000 mL | INTRAVENOUS | Status: AC | PRN
  Administered 2016-07-30: 2 mL via INTRAVENOUS

## 2016-08-03 DIAGNOSIS — D509 Iron deficiency anemia, unspecified: Secondary | ICD-10-CM | POA: Diagnosis not present

## 2016-08-03 DIAGNOSIS — N189 Chronic kidney disease, unspecified: Secondary | ICD-10-CM | POA: Diagnosis not present

## 2016-08-03 DIAGNOSIS — N179 Acute kidney failure, unspecified: Secondary | ICD-10-CM | POA: Diagnosis not present

## 2016-08-03 DIAGNOSIS — N184 Chronic kidney disease, stage 4 (severe): Secondary | ICD-10-CM | POA: Diagnosis not present

## 2016-08-03 DIAGNOSIS — D649 Anemia, unspecified: Secondary | ICD-10-CM | POA: Diagnosis not present

## 2016-08-05 DIAGNOSIS — D649 Anemia, unspecified: Secondary | ICD-10-CM | POA: Diagnosis not present

## 2016-08-05 DIAGNOSIS — N179 Acute kidney failure, unspecified: Secondary | ICD-10-CM | POA: Diagnosis not present

## 2016-08-05 DIAGNOSIS — D509 Iron deficiency anemia, unspecified: Secondary | ICD-10-CM | POA: Diagnosis not present

## 2016-08-05 DIAGNOSIS — Z992 Dependence on renal dialysis: Secondary | ICD-10-CM | POA: Diagnosis not present

## 2016-08-05 DIAGNOSIS — N189 Chronic kidney disease, unspecified: Secondary | ICD-10-CM | POA: Diagnosis not present

## 2016-08-05 DIAGNOSIS — N184 Chronic kidney disease, stage 4 (severe): Secondary | ICD-10-CM | POA: Diagnosis not present

## 2016-08-05 DIAGNOSIS — N186 End stage renal disease: Secondary | ICD-10-CM | POA: Diagnosis not present

## 2016-08-07 DIAGNOSIS — D649 Anemia, unspecified: Secondary | ICD-10-CM | POA: Diagnosis not present

## 2016-08-07 DIAGNOSIS — D509 Iron deficiency anemia, unspecified: Secondary | ICD-10-CM | POA: Diagnosis not present

## 2016-08-07 DIAGNOSIS — N184 Chronic kidney disease, stage 4 (severe): Secondary | ICD-10-CM | POA: Diagnosis not present

## 2016-08-07 DIAGNOSIS — Z23 Encounter for immunization: Secondary | ICD-10-CM | POA: Diagnosis not present

## 2016-08-07 DIAGNOSIS — N2581 Secondary hyperparathyroidism of renal origin: Secondary | ICD-10-CM | POA: Diagnosis not present

## 2016-08-07 DIAGNOSIS — N189 Chronic kidney disease, unspecified: Secondary | ICD-10-CM | POA: Diagnosis not present

## 2016-08-07 DIAGNOSIS — N179 Acute kidney failure, unspecified: Secondary | ICD-10-CM | POA: Diagnosis not present

## 2016-08-10 DIAGNOSIS — N2581 Secondary hyperparathyroidism of renal origin: Secondary | ICD-10-CM | POA: Diagnosis not present

## 2016-08-10 DIAGNOSIS — D649 Anemia, unspecified: Secondary | ICD-10-CM | POA: Diagnosis not present

## 2016-08-10 DIAGNOSIS — Z23 Encounter for immunization: Secondary | ICD-10-CM | POA: Diagnosis not present

## 2016-08-10 DIAGNOSIS — N184 Chronic kidney disease, stage 4 (severe): Secondary | ICD-10-CM | POA: Diagnosis not present

## 2016-08-10 DIAGNOSIS — D509 Iron deficiency anemia, unspecified: Secondary | ICD-10-CM | POA: Diagnosis not present

## 2016-08-10 DIAGNOSIS — N179 Acute kidney failure, unspecified: Secondary | ICD-10-CM | POA: Diagnosis not present

## 2016-08-11 ENCOUNTER — Ambulatory Visit (INDEPENDENT_AMBULATORY_CARE_PROVIDER_SITE_OTHER): Payer: Medicare Other | Admitting: *Deleted

## 2016-08-11 ENCOUNTER — Telehealth (HOSPITAL_COMMUNITY): Payer: Self-pay | Admitting: Vascular Surgery

## 2016-08-11 DIAGNOSIS — I255 Ischemic cardiomyopathy: Secondary | ICD-10-CM

## 2016-08-11 DIAGNOSIS — I482 Chronic atrial fibrillation, unspecified: Secondary | ICD-10-CM

## 2016-08-11 DIAGNOSIS — Z7901 Long term (current) use of anticoagulants: Secondary | ICD-10-CM

## 2016-08-11 LAB — POCT INR: INR: 1.5

## 2016-08-11 NOTE — Telephone Encounter (Signed)
Pt had an appointment with coumadin clinic this am and his INR is all over the place. Pt wondering if he can go back on Eliquis. Pt wife said that he is doing his 24 hour urine test now. Dialysis said that he may not need many more treatments. Pt wife also states that dialysis and coumadin clinic recommended Eliquis again, if approved by MD. Please advise.

## 2016-08-11 NOTE — Telephone Encounter (Signed)
What is creatinine now. If Renal ok with it, I am ok with it. Was he on 5 bid previously?

## 2016-08-11 NOTE — Telephone Encounter (Signed)
Pt needs to speak to a nurse .Marland KitchenURGENT

## 2016-08-12 DIAGNOSIS — D509 Iron deficiency anemia, unspecified: Secondary | ICD-10-CM | POA: Diagnosis not present

## 2016-08-12 DIAGNOSIS — N179 Acute kidney failure, unspecified: Secondary | ICD-10-CM | POA: Diagnosis not present

## 2016-08-12 DIAGNOSIS — N184 Chronic kidney disease, stage 4 (severe): Secondary | ICD-10-CM | POA: Diagnosis not present

## 2016-08-12 DIAGNOSIS — D649 Anemia, unspecified: Secondary | ICD-10-CM | POA: Diagnosis not present

## 2016-08-12 DIAGNOSIS — Z23 Encounter for immunization: Secondary | ICD-10-CM | POA: Diagnosis not present

## 2016-08-12 DIAGNOSIS — N2581 Secondary hyperparathyroidism of renal origin: Secondary | ICD-10-CM | POA: Diagnosis not present

## 2016-08-12 IMAGING — US US ABDOMEN COMPLETE
1 series · 14 of 25 positions shown · non-contrast
Comparison: CT 02/07/2015

CLINICAL DATA: Abdominal pain, stage IV renal disease.

EXAM:
ULTRASOUND ABDOMEN COMPLETE

[Series 1: us abdomen complete · 0.26mm/px · 14 of 46 slices shown]
[im 1/46]
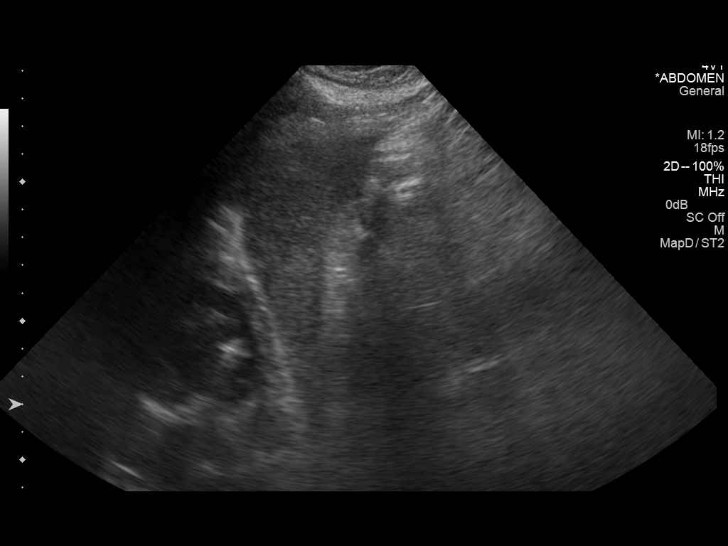
[im 4/46]
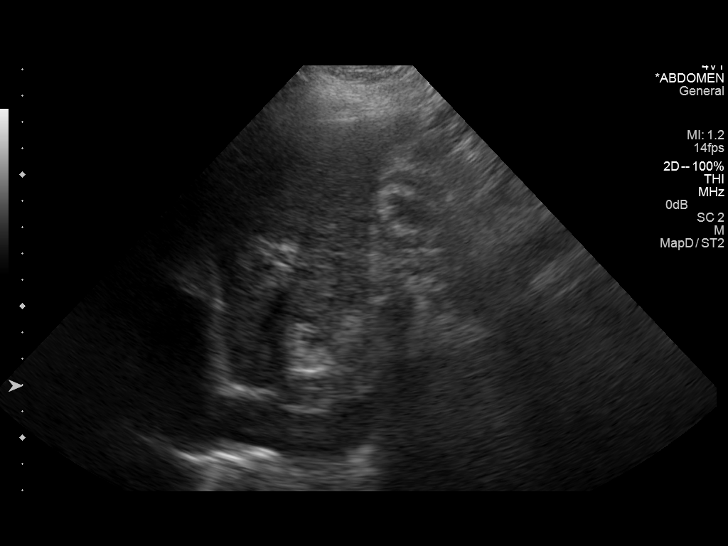
[im 8/46]
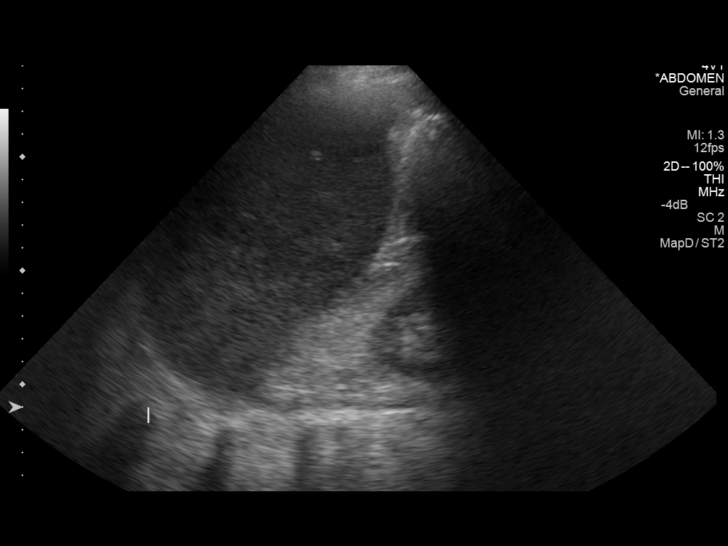
[im 12/46]
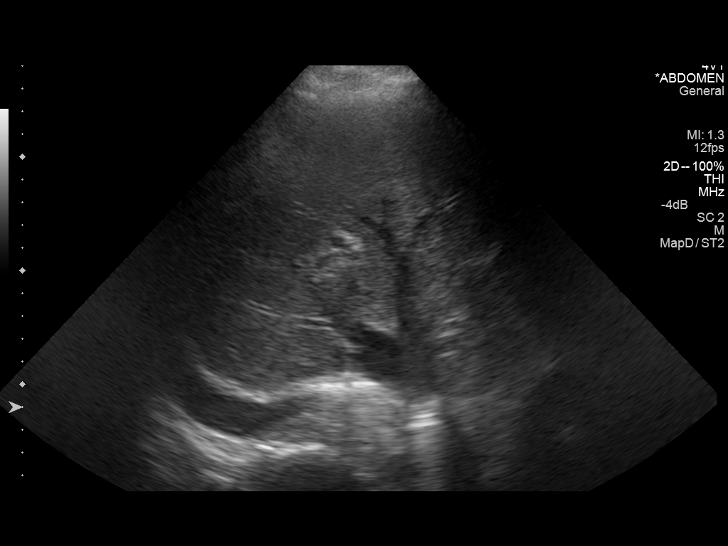
[im 16/46]
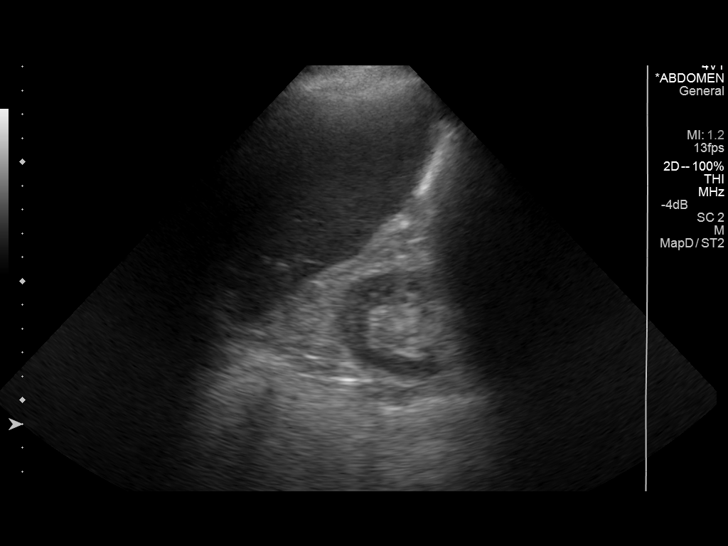
[im 17/46]
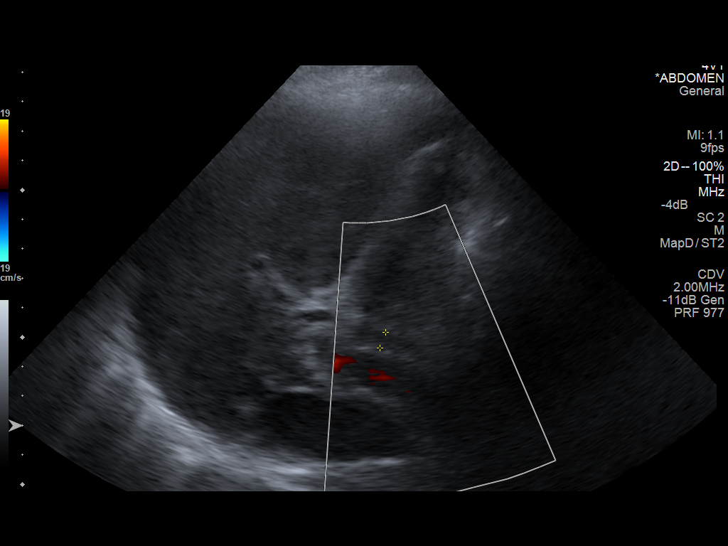
[im 21/46]
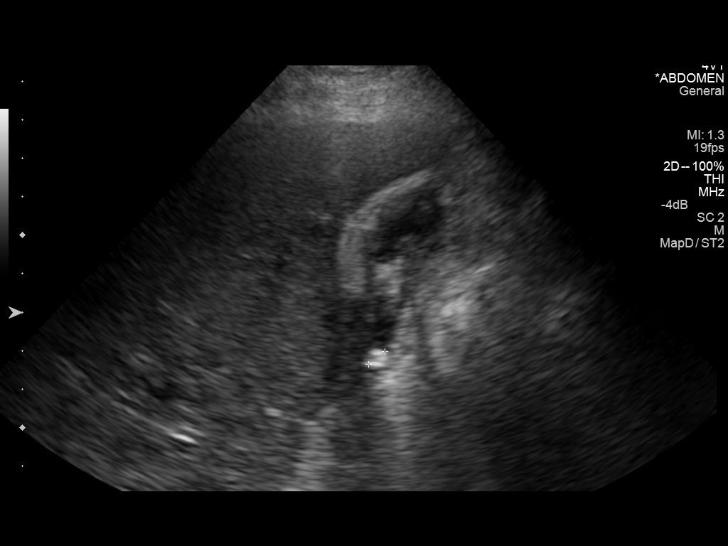
[im 25/46]
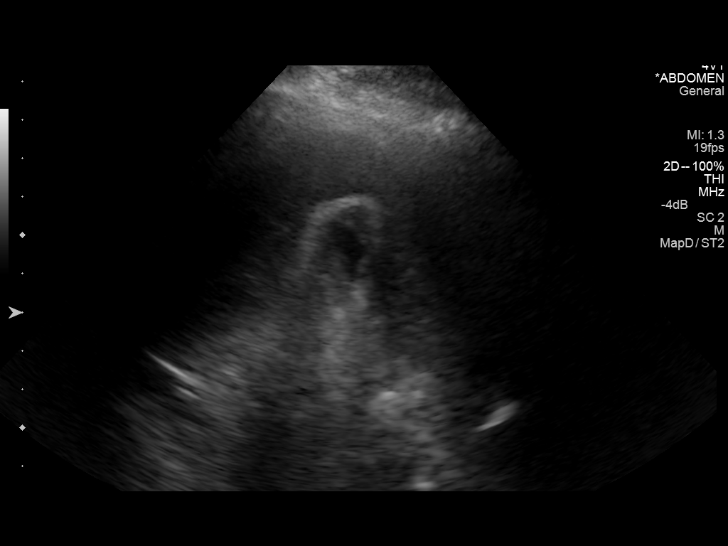
[im 29/46]
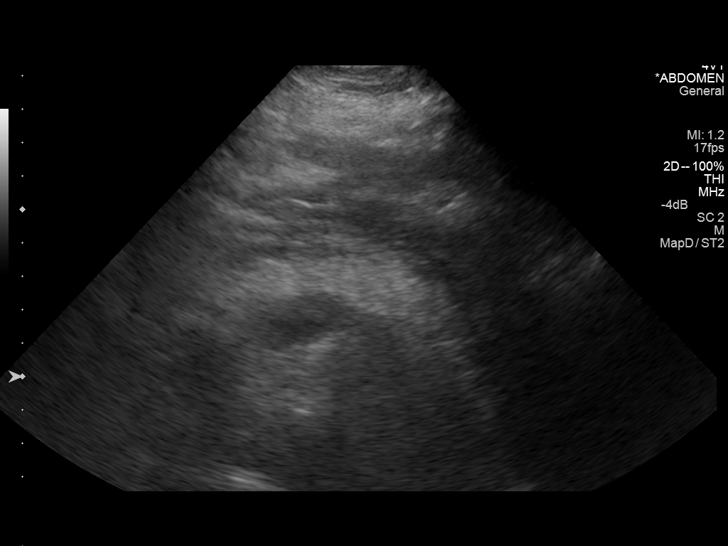
[im 31/46]
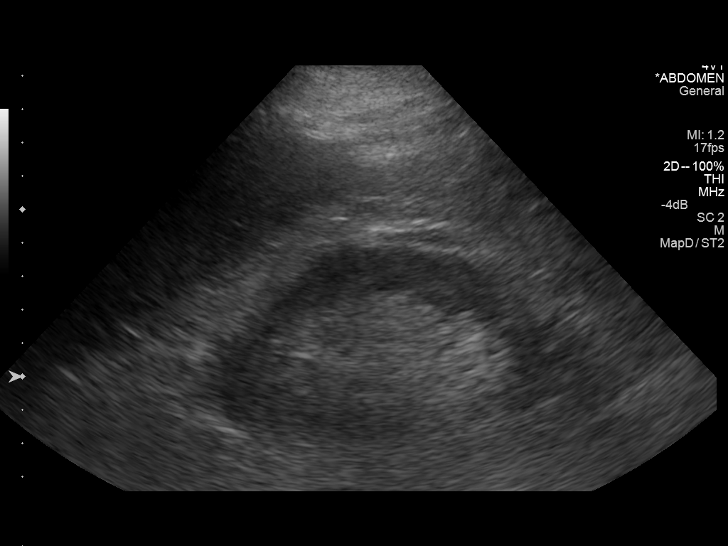
[im 34/46]
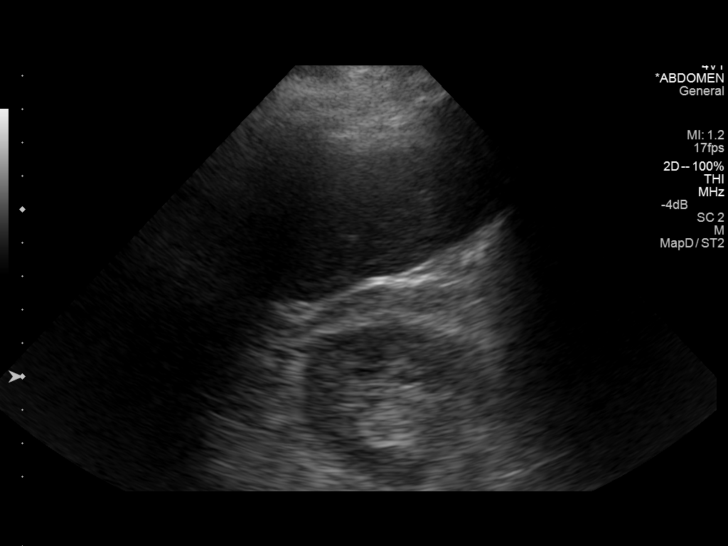
[im 38/46]
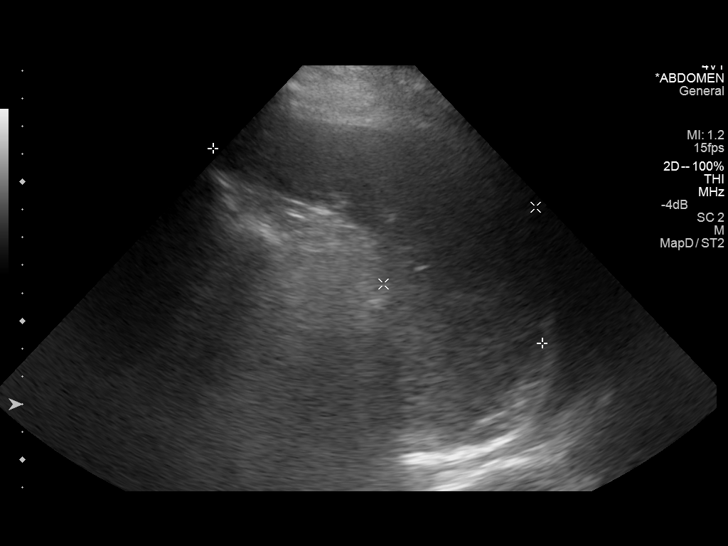
[im 42/46]
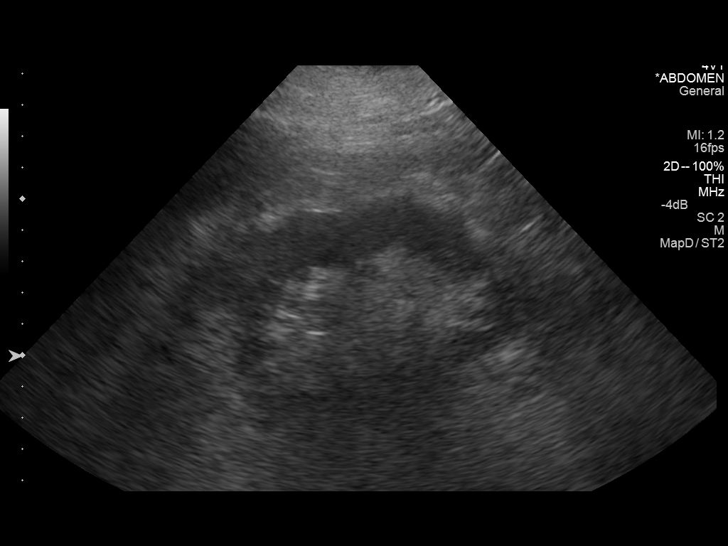
[im 46/46]
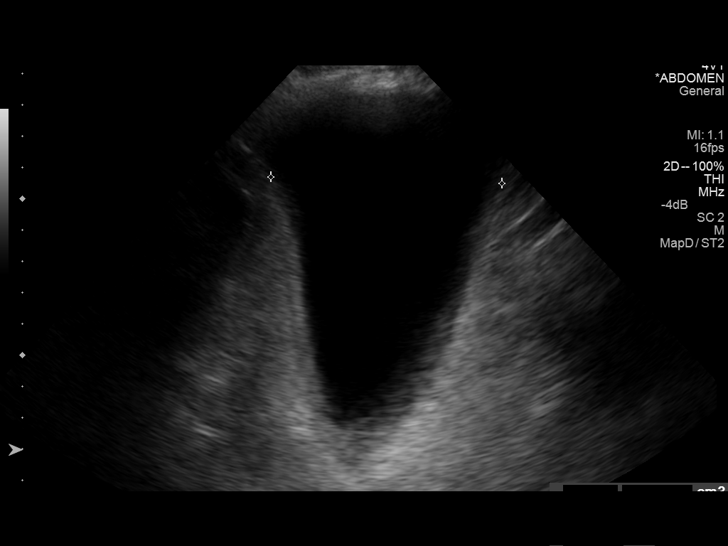

[14 of 25 positions shown; findings below may reference images not displayed]

FINDINGS: Gallbladder: Gallbladder is contracted and thick-walled. Several
gallstones are noted measuring up to 5 mm. There some debris within
the gallbladder. Negative sonographic Murphy's sign.

Common bile duct: Diameter: Upper limits of normal at 6 mm.

Liver: No focal lesion identified. Within normal limits in
parenchymal echogenicity.

IVC: Within normal limits

Pancreas: Visualized portion unremarkable.

Spleen: Mildly enlarged spleen with a calculated volume of 657 cubic
cm.

Right Kidney: Length: 11.2 cm. Echogenicity within normal limits. No
mass or hydronephrosis visualized.

Left Kidney: Length: 11.4 cm. Echogenicity within normal limits. No
mass or hydronephrosis visualized.

Abdominal aorta: No aneurysm visualized.

Other findings: Small right pleural effusion.
IMPRESSION: 1. Contracted gallbladder with wall thickening and gallstones
suggests chronic cholecystitis. Negative sonographic Murphy's sign.
2. Common bile duct upper limits of normal.
3. Mild splenomegaly.
4. Small right pleural effusion.

## 2016-08-13 IMAGING — NM NM HEPATOBILIARY IMAGE, INC GB
2 series · 12 of 12 positions shown · non-contrast
Comparison: None.

CLINICAL DATA: Chronic cholecystitis

EXAM:
NUCLEAR MEDICINE HEPATOBILIARY IMAGING
TECHNIQUE: Sequential images of the abdomen were obtained [DATE] minutes
following intravenous administration of radiopharmaceutical.
RADIOPHARMACEUTICALS:  5.3 mCi Millicurie 3c-RRm Choletec

[Series 1: biliary · 4.14mm/px · 6 of 60 frames shown]
[frame 6/60]
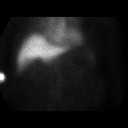
[frame 16/60]
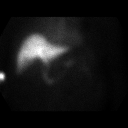
[frame 26/60]
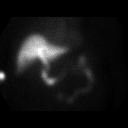
[frame 36/60]
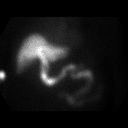
[frame 46/60]
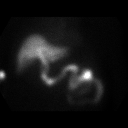
[frame 56/60]
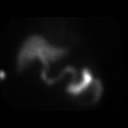

[Series 2: gbef · 4.14mm/px · 6 of 31 frames shown]
[frame 3/31]
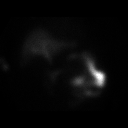
[frame 8/31]
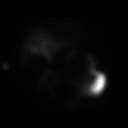
[frame 13/31]
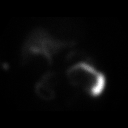
[frame 18/31]
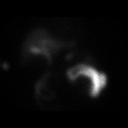
[frame 23/31]
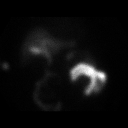
[frame 29/31]
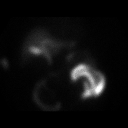

[12 of 12 positions shown; findings below may reference images not displayed]

FINDINGS: Mild delay in clearance of blood pool. Uptake in the liver is
homogeneous and adequate. The central biliary tree and bowel is seen
by 10 minutes. The gallbladder was not visualized at 1 hour and
mg of morphine was given intravenously. The gallbladder subsequently
filled.
IMPRESSION: 1. Patent cystic duct with delayed gallbladder filling.
2. Patent common bile duct.

## 2016-08-14 IMAGING — CT CT ABD-PELV W/O CM
2 of 4 series · 16 of 46 positions shown, 18 images · non-contrast
Comparison: 02/07/2015

CLINICAL DATA: LFTs improved. No significant complaints today.
Urine is still dark in color. Passed clots. History of colostomy.

EXAM:
CT ABDOMEN AND PELVIS WITHOUT CONTRAST
TECHNIQUE: Multidetector CT imaging of the abdomen and pelvis was performed
following the standard protocol without IV contrast.

[Series 2: stone study 5.0 i30f 1 · axial · 0.93mm/px · z∈[-407,+73]mm · 13 of 106 slices shown, 15 images]
[im 5/106  soft-tissue]
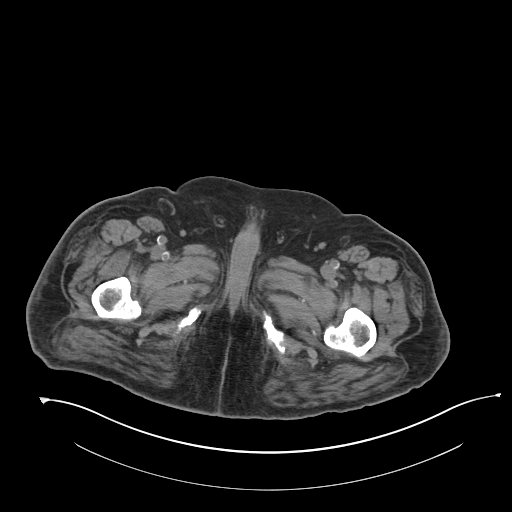
[im 5/106  bone]
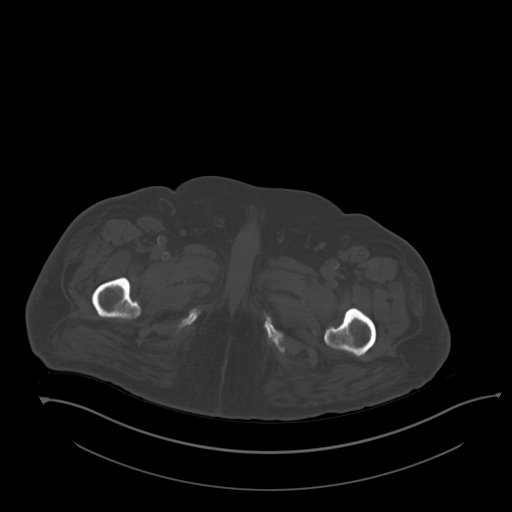
[im 14/106  soft-tissue]
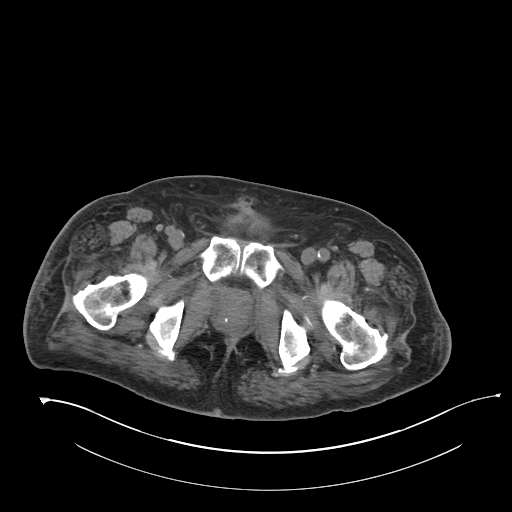
[im 22/106  soft-tissue]
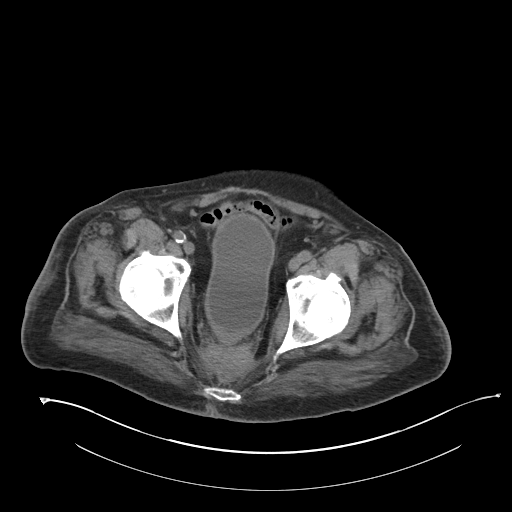
[im 31/106  soft-tissue]
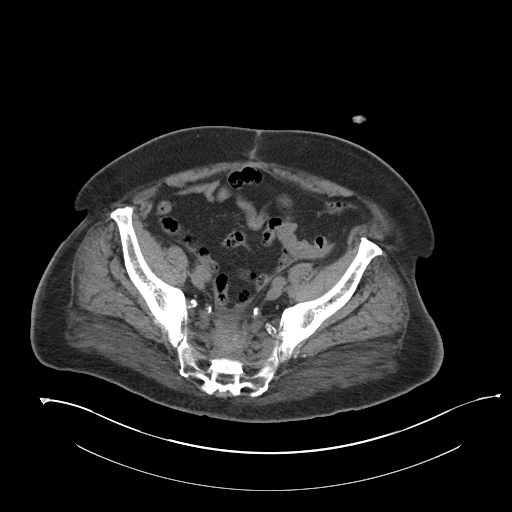
[im 36/106  soft-tissue]
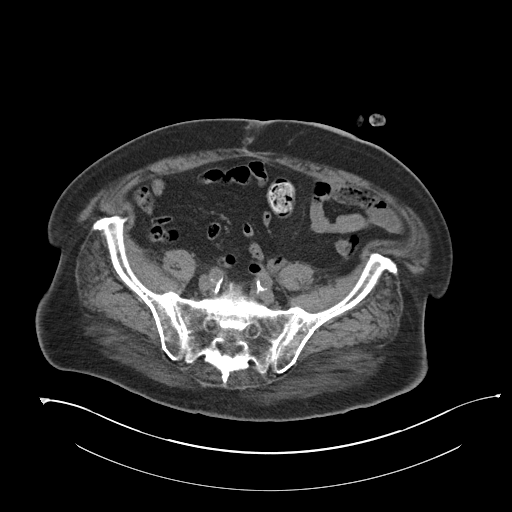
[im 44/106  soft-tissue]
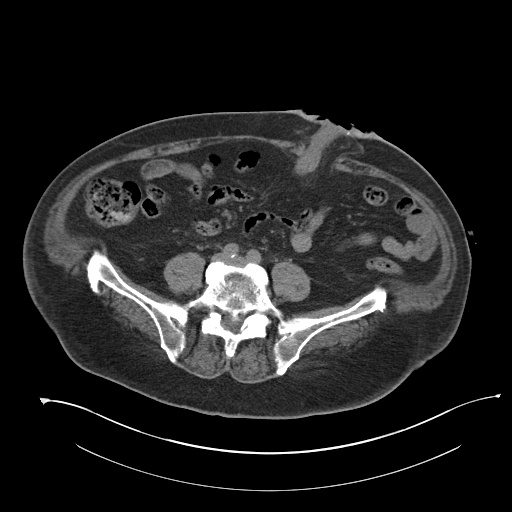
[im 53/106  soft-tissue]
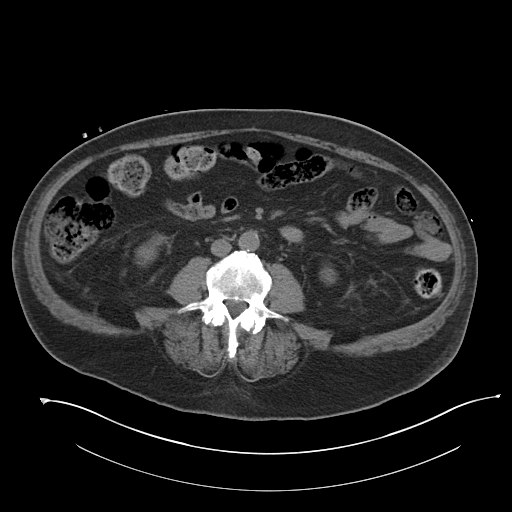
[im 62/106  soft-tissue]
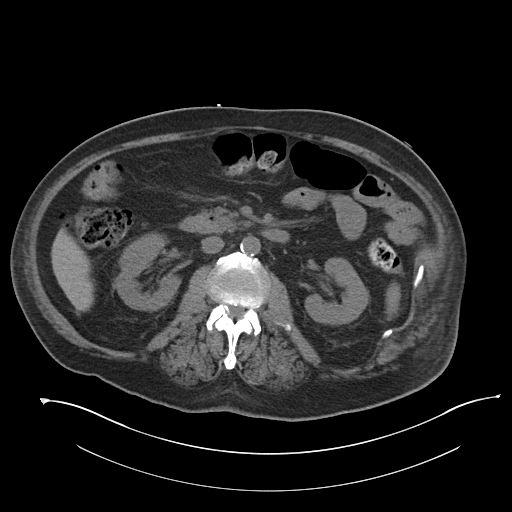
[im 71/106  soft-tissue]
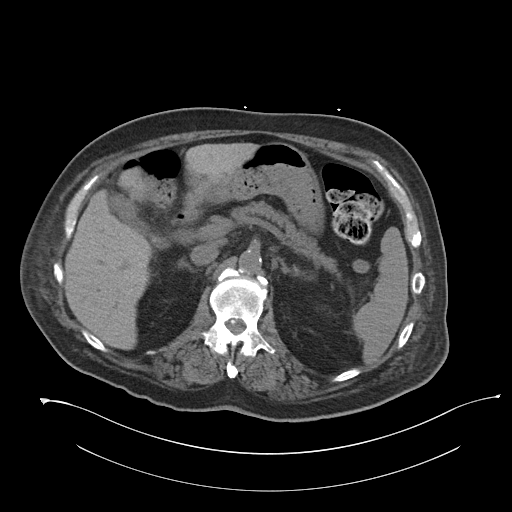
[im 71/106  bone]
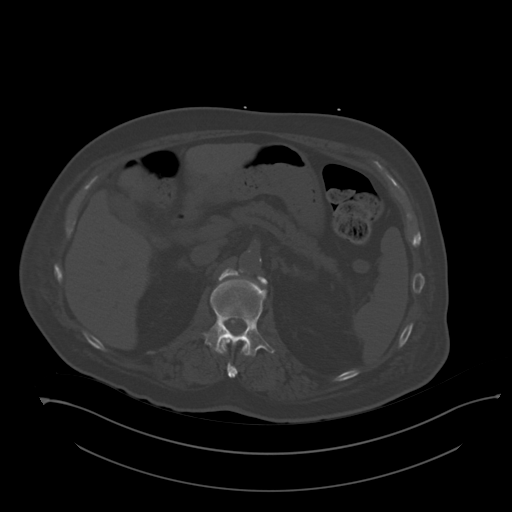
[im 75/106  soft-tissue]
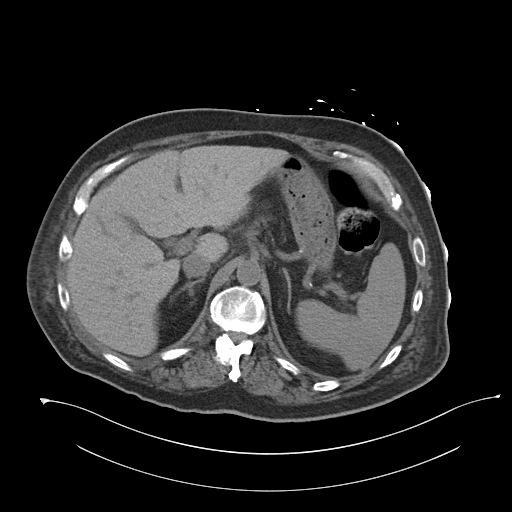
[im 84/106  soft-tissue]
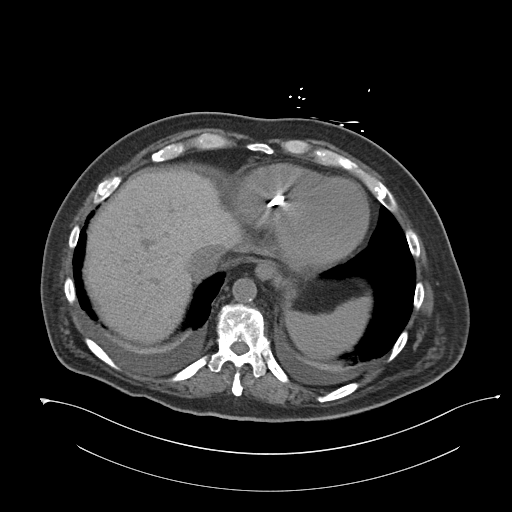
[im 92/106  soft-tissue]
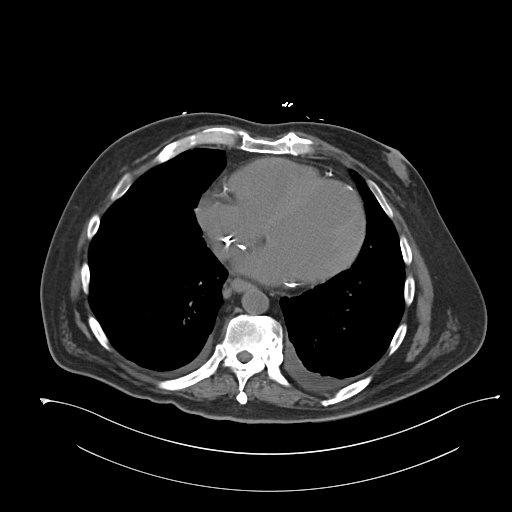
[im 101/106  soft-tissue]
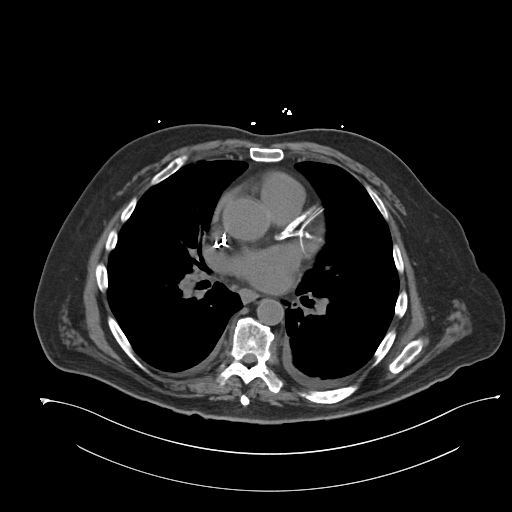

[Series 5: coronal soft tissue · coronal · 0.92mm/px · 3 of 96 slices shown]
[im 32/96  soft-tissue]
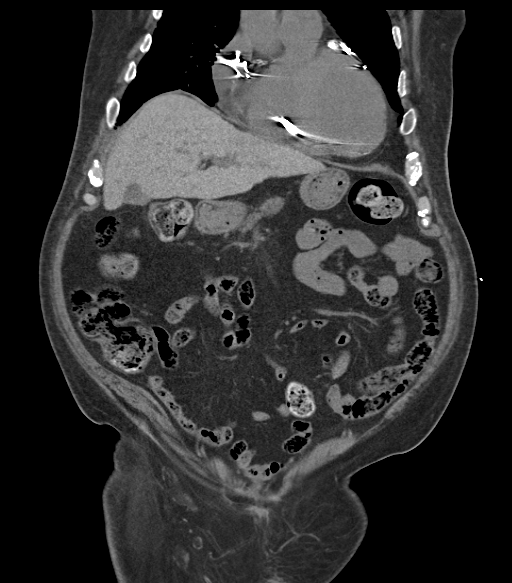
[im 43/96  soft-tissue]
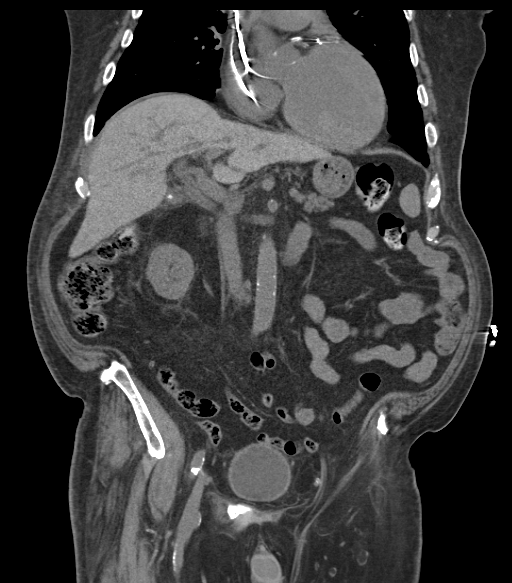
[im 53/96  soft-tissue]
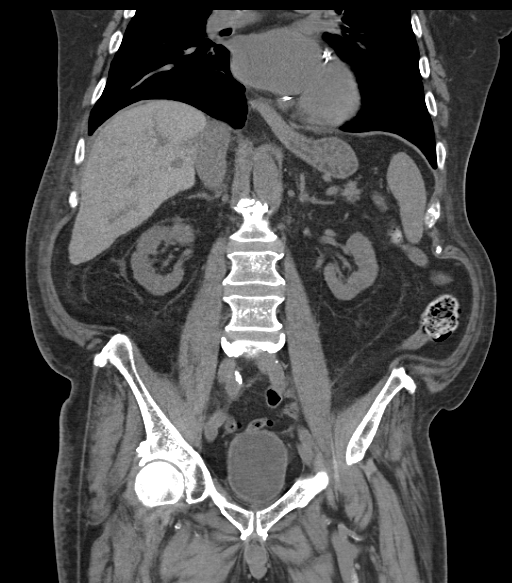

[16 of 46 positions shown; findings below may reference images not displayed]

FINDINGS: Lower chest: Pacing leads and coronary stents. Small bilateral
pleural effusions associated with bibasilar atelectasis. No
pulmonary nodules.

Upper abdomen: The noncontrast appearance of the liver is high
attenuation. Liver is normal in size. No focal lesions are
identified. Gallbladder is present a contains probable small
calcified gallstone. The spleen is normal in appearance. No focal
abnormality identified within the pancreas, adrenal glands, or
kidneys.

Gastrointestinal tract: The stomach and small bowel loops are normal
in appearance. Left mid abdominal colostomy. There is soft tissue in
the region of the rectum possibly representing a rectal stump and
unchanged. No evidence for bowel obstruction. The appendix is well
seen and has a normal appearance.

Pelvis: There is no free pelvic fluid. Seeds versus clips within the
prostate gland. Urinary bladder is distended. Adjacent to the rectum
in the region of the ischiorectal fossa there is a small soft tissue
nodule measuring 2.3 x 2.6 x 2.7 cm. Findings are suspicious for
recurrence of rectal cancer.

Retroperitoneum: No retroperitoneal or mesenteric adenopathy. No
evidence for aortic aneurysm.

Abdominal wall: Left mid abdominal ostomy site is unremarkable.
There is mild dependent body wall edema.

Osseous structures: Lower thoracic and lumbar spondylosis. No
suspicious lytic or blastic lesions are identified.
IMPRESSION: 1. Diffusely high attenuation liver density without focal lesion.
Findings are suspicious for heavy metal deposition. Considerations
include hemochromatosis, hemosiderosis, Lupian deposition from
Comeli disease. Glycogen storage disease, amiodarone toxicity and
gold therapy can also cause this appearance.
2. Left mid abdominal colostomy is unremarkable in appearance.
3. 2.7 cm nodule in the right perirectal region, suspicious for
recurrent cancer.

## 2016-08-16 DIAGNOSIS — N184 Chronic kidney disease, stage 4 (severe): Secondary | ICD-10-CM | POA: Diagnosis not present

## 2016-08-16 DIAGNOSIS — Z23 Encounter for immunization: Secondary | ICD-10-CM | POA: Diagnosis not present

## 2016-08-16 DIAGNOSIS — N179 Acute kidney failure, unspecified: Secondary | ICD-10-CM | POA: Diagnosis not present

## 2016-08-16 DIAGNOSIS — D509 Iron deficiency anemia, unspecified: Secondary | ICD-10-CM | POA: Diagnosis not present

## 2016-08-16 DIAGNOSIS — N2581 Secondary hyperparathyroidism of renal origin: Secondary | ICD-10-CM | POA: Diagnosis not present

## 2016-08-16 DIAGNOSIS — D649 Anemia, unspecified: Secondary | ICD-10-CM | POA: Diagnosis not present

## 2016-08-19 DIAGNOSIS — N184 Chronic kidney disease, stage 4 (severe): Secondary | ICD-10-CM | POA: Diagnosis not present

## 2016-08-19 DIAGNOSIS — N2581 Secondary hyperparathyroidism of renal origin: Secondary | ICD-10-CM | POA: Diagnosis not present

## 2016-08-19 DIAGNOSIS — D649 Anemia, unspecified: Secondary | ICD-10-CM | POA: Diagnosis not present

## 2016-08-19 DIAGNOSIS — Z23 Encounter for immunization: Secondary | ICD-10-CM | POA: Diagnosis not present

## 2016-08-19 DIAGNOSIS — D509 Iron deficiency anemia, unspecified: Secondary | ICD-10-CM | POA: Diagnosis not present

## 2016-08-19 DIAGNOSIS — N179 Acute kidney failure, unspecified: Secondary | ICD-10-CM | POA: Diagnosis not present

## 2016-08-20 DIAGNOSIS — N179 Acute kidney failure, unspecified: Secondary | ICD-10-CM | POA: Diagnosis not present

## 2016-08-20 DIAGNOSIS — Z23 Encounter for immunization: Secondary | ICD-10-CM | POA: Diagnosis not present

## 2016-08-20 DIAGNOSIS — D649 Anemia, unspecified: Secondary | ICD-10-CM | POA: Diagnosis not present

## 2016-08-20 DIAGNOSIS — N2581 Secondary hyperparathyroidism of renal origin: Secondary | ICD-10-CM | POA: Diagnosis not present

## 2016-08-20 DIAGNOSIS — N184 Chronic kidney disease, stage 4 (severe): Secondary | ICD-10-CM | POA: Diagnosis not present

## 2016-08-20 DIAGNOSIS — D509 Iron deficiency anemia, unspecified: Secondary | ICD-10-CM | POA: Diagnosis not present

## 2016-08-23 ENCOUNTER — Ambulatory Visit (INDEPENDENT_AMBULATORY_CARE_PROVIDER_SITE_OTHER): Payer: Medicare Other | Admitting: *Deleted

## 2016-08-23 DIAGNOSIS — I482 Chronic atrial fibrillation, unspecified: Secondary | ICD-10-CM

## 2016-08-23 DIAGNOSIS — I255 Ischemic cardiomyopathy: Secondary | ICD-10-CM | POA: Diagnosis not present

## 2016-08-23 DIAGNOSIS — Z7901 Long term (current) use of anticoagulants: Secondary | ICD-10-CM | POA: Diagnosis not present

## 2016-08-23 LAB — POCT INR: INR: 1.9

## 2016-08-23 MED ORDER — WARFARIN SODIUM 2 MG PO TABS: ORAL_TABLET | ORAL | 3 refills | Status: DC

## 2016-08-24 DIAGNOSIS — D649 Anemia, unspecified: Secondary | ICD-10-CM | POA: Diagnosis not present

## 2016-08-24 DIAGNOSIS — D509 Iron deficiency anemia, unspecified: Secondary | ICD-10-CM | POA: Diagnosis not present

## 2016-08-24 DIAGNOSIS — N179 Acute kidney failure, unspecified: Secondary | ICD-10-CM | POA: Diagnosis not present

## 2016-08-24 DIAGNOSIS — N2581 Secondary hyperparathyroidism of renal origin: Secondary | ICD-10-CM | POA: Diagnosis not present

## 2016-08-24 DIAGNOSIS — Z23 Encounter for immunization: Secondary | ICD-10-CM | POA: Diagnosis not present

## 2016-08-24 DIAGNOSIS — N184 Chronic kidney disease, stage 4 (severe): Secondary | ICD-10-CM | POA: Diagnosis not present

## 2016-08-26 DIAGNOSIS — N179 Acute kidney failure, unspecified: Secondary | ICD-10-CM | POA: Diagnosis not present

## 2016-08-26 DIAGNOSIS — D649 Anemia, unspecified: Secondary | ICD-10-CM | POA: Diagnosis not present

## 2016-08-26 DIAGNOSIS — D509 Iron deficiency anemia, unspecified: Secondary | ICD-10-CM | POA: Diagnosis not present

## 2016-08-26 DIAGNOSIS — N184 Chronic kidney disease, stage 4 (severe): Secondary | ICD-10-CM | POA: Diagnosis not present

## 2016-08-26 DIAGNOSIS — N2581 Secondary hyperparathyroidism of renal origin: Secondary | ICD-10-CM | POA: Diagnosis not present

## 2016-08-26 DIAGNOSIS — Z23 Encounter for immunization: Secondary | ICD-10-CM | POA: Diagnosis not present

## 2016-08-31 DIAGNOSIS — N184 Chronic kidney disease, stage 4 (severe): Secondary | ICD-10-CM | POA: Diagnosis not present

## 2016-08-31 DIAGNOSIS — Z23 Encounter for immunization: Secondary | ICD-10-CM | POA: Diagnosis not present

## 2016-08-31 DIAGNOSIS — N2581 Secondary hyperparathyroidism of renal origin: Secondary | ICD-10-CM | POA: Diagnosis not present

## 2016-08-31 DIAGNOSIS — D649 Anemia, unspecified: Secondary | ICD-10-CM | POA: Diagnosis not present

## 2016-08-31 DIAGNOSIS — N179 Acute kidney failure, unspecified: Secondary | ICD-10-CM | POA: Diagnosis not present

## 2016-08-31 DIAGNOSIS — D509 Iron deficiency anemia, unspecified: Secondary | ICD-10-CM | POA: Diagnosis not present

## 2016-09-01 IMAGING — CR DG CHEST 1V PORT
1 series · 1 of 1 positions shown · non-contrast
Comparison: 02/23/2015

CLINICAL DATA: Chest pain and shortness of breath for 1 day.

EXAM:
PORTABLE CHEST - 1 VIEW

[ap portable]
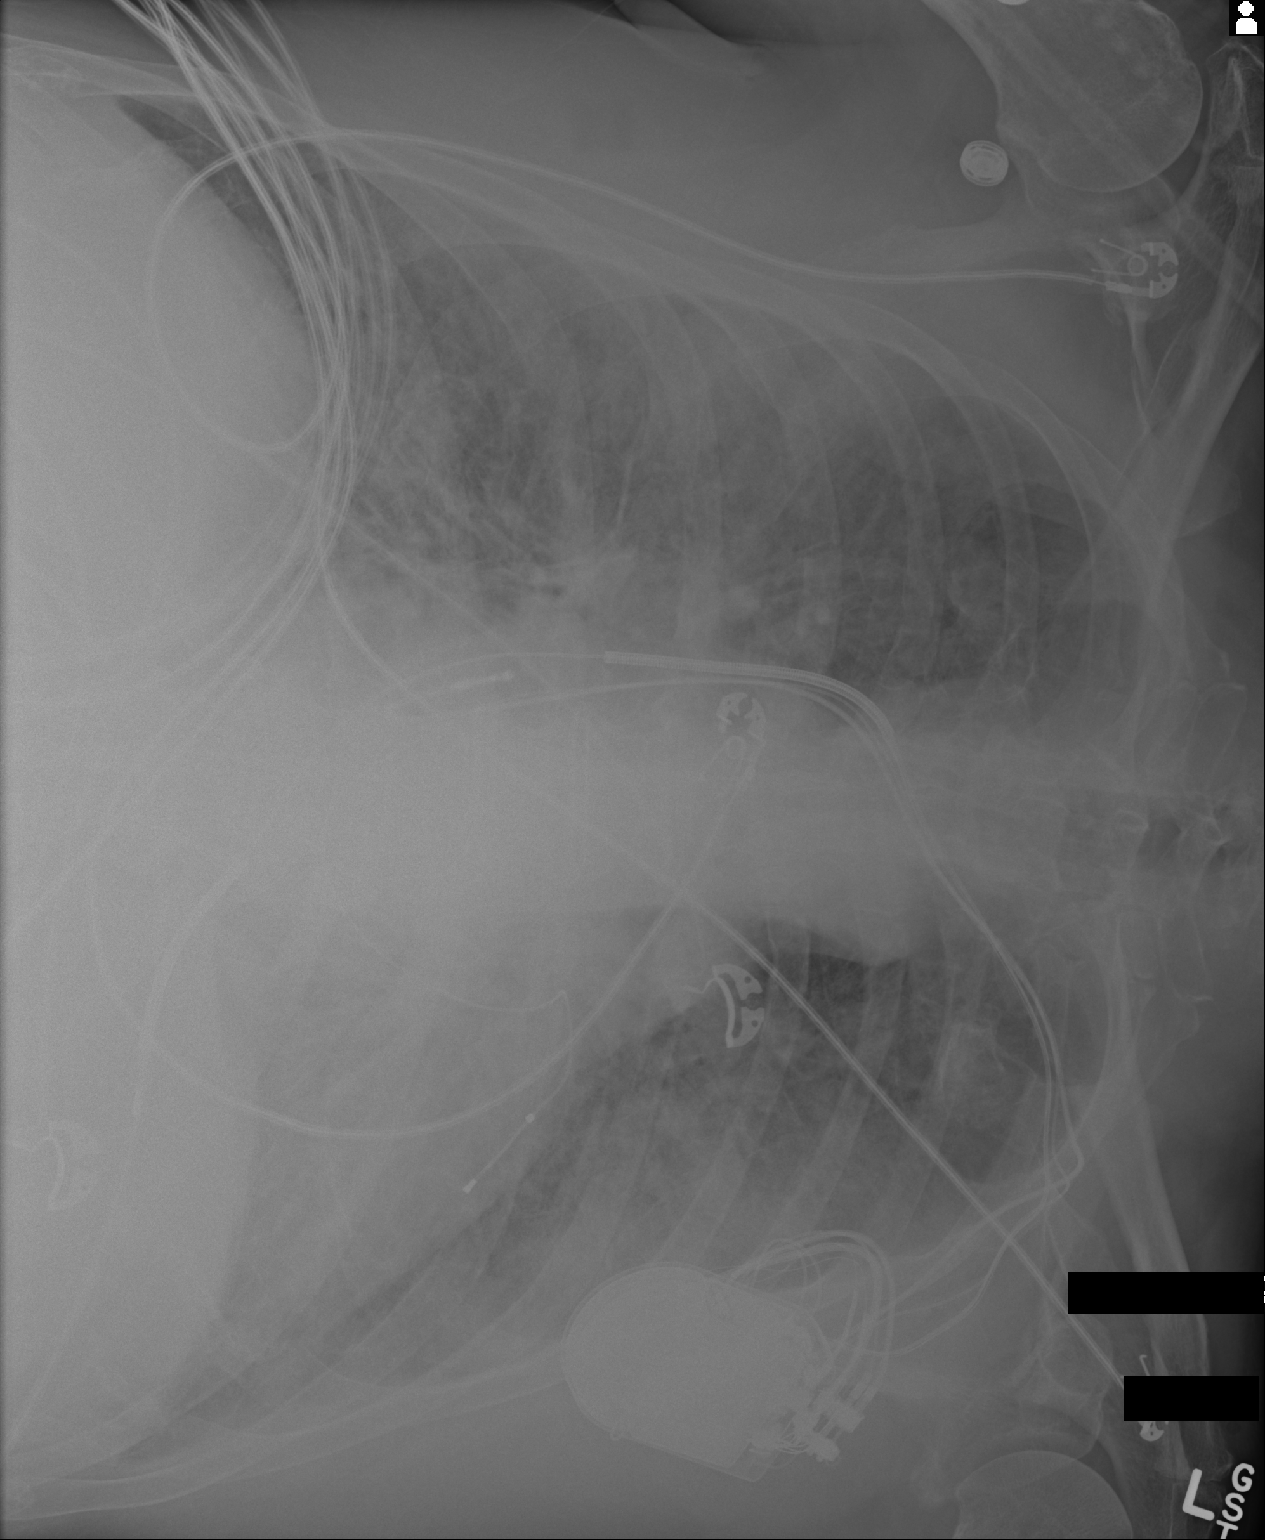

[1 of 1 positions shown; findings below may reference images not displayed]

FINDINGS: Left-sided pacemaker remains in place. Cardiomegaly is unchanged.
There is vascular congestion and perihilar peribronchial cuffing
suggestive of pulmonary edema. No confluent airspace disease to
suggest pneumonia. No large pleural effusion or pneumothorax.
IMPRESSION: Vascular congestion and pulmonary edema, findings most consistent
with CHF. Cardiomegaly is stable.

## 2016-09-01 IMAGING — DX DG CHEST 1V PORT
1 series · 1 of 1 positions shown · non-contrast
Comparison: Film at 6360 hr

CLINICAL DATA: PICC line placement.  CHF.

EXAM:
PORTABLE CHEST - 1 VIEW

[chest ap]
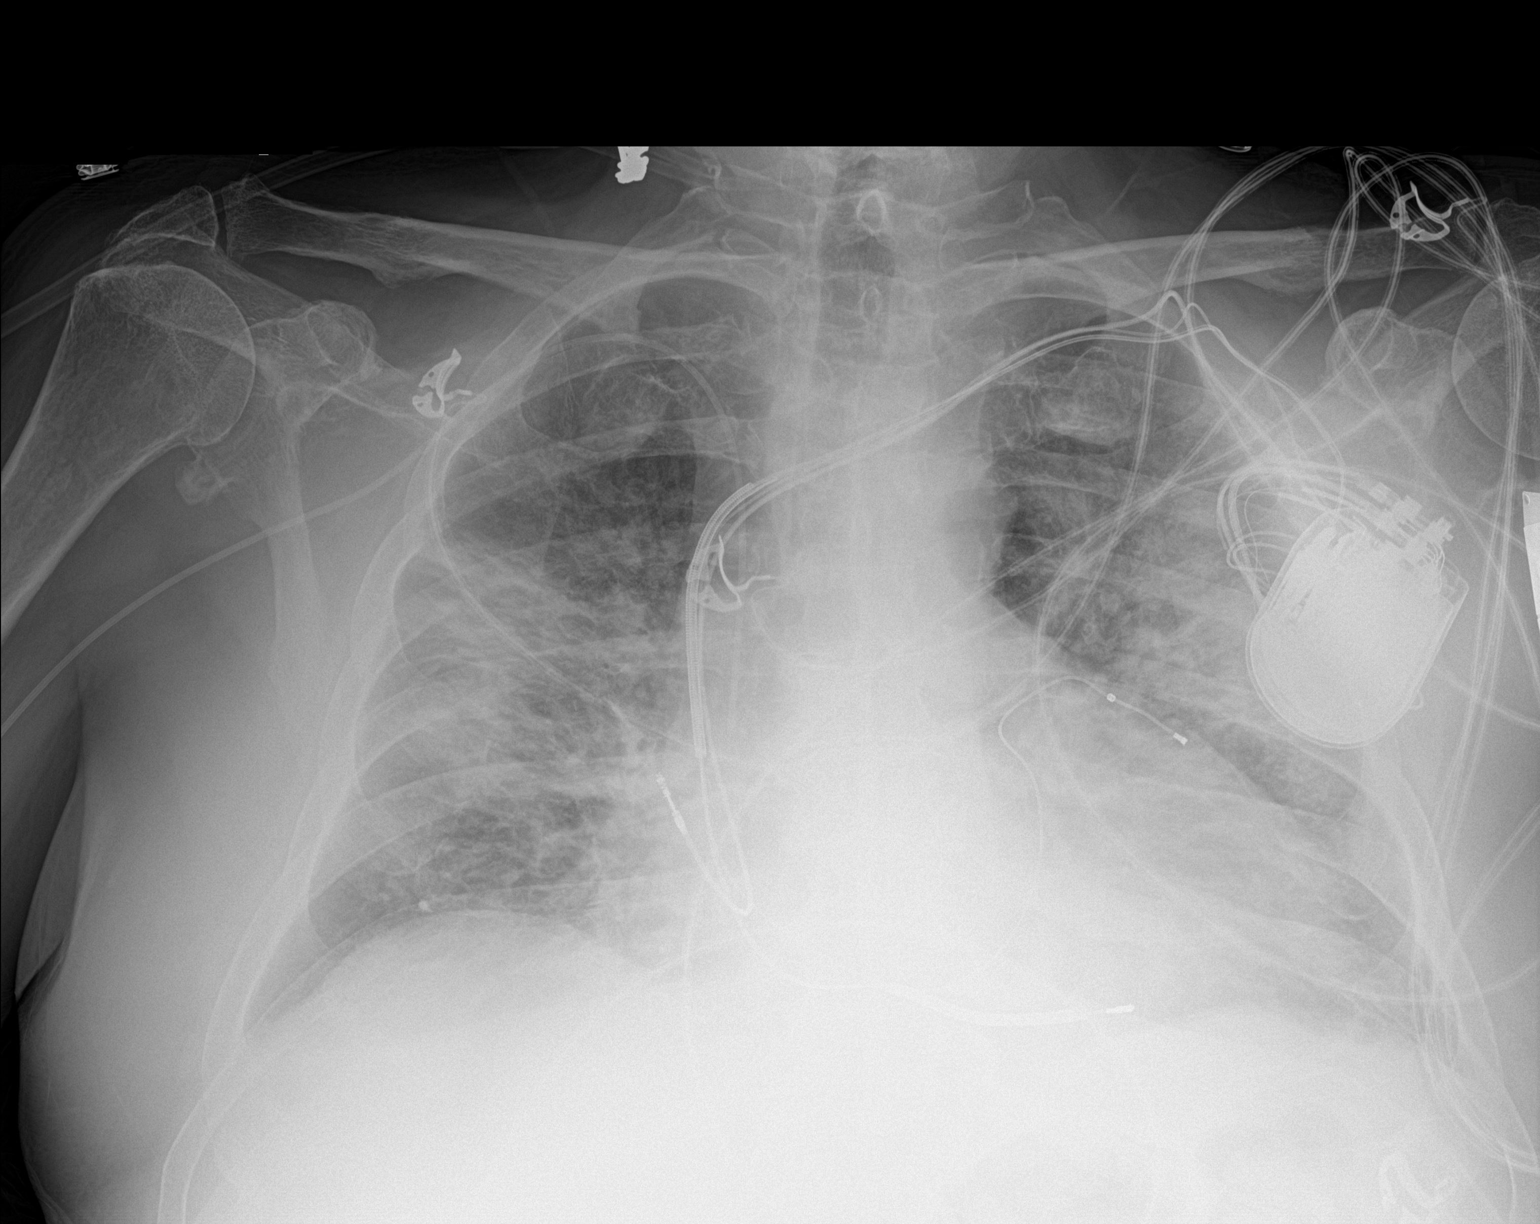

[1 of 1 positions shown; findings below may reference images not displayed]

FINDINGS: Right upper extremity PICC line is been placed with the tip in the
distal SVC. Degree of pulmonary edema appears slightly improved
compared to the prior study. Biventricular pacing/ ICD device shows
stable appearance. There is stable mild cardiac enlargement. No
significant pleural fluid. No pneumothorax.
IMPRESSION: 1. PICC line tip and distal SVC.
2. Interval decrease in pulmonary edema.

## 2016-09-02 DIAGNOSIS — N179 Acute kidney failure, unspecified: Secondary | ICD-10-CM | POA: Diagnosis not present

## 2016-09-02 DIAGNOSIS — N2581 Secondary hyperparathyroidism of renal origin: Secondary | ICD-10-CM | POA: Diagnosis not present

## 2016-09-02 DIAGNOSIS — D649 Anemia, unspecified: Secondary | ICD-10-CM | POA: Diagnosis not present

## 2016-09-02 DIAGNOSIS — D509 Iron deficiency anemia, unspecified: Secondary | ICD-10-CM | POA: Diagnosis not present

## 2016-09-02 DIAGNOSIS — N184 Chronic kidney disease, stage 4 (severe): Secondary | ICD-10-CM | POA: Diagnosis not present

## 2016-09-02 DIAGNOSIS — Z23 Encounter for immunization: Secondary | ICD-10-CM | POA: Diagnosis not present

## 2016-09-04 DIAGNOSIS — Z992 Dependence on renal dialysis: Secondary | ICD-10-CM | POA: Diagnosis not present

## 2016-09-04 DIAGNOSIS — N186 End stage renal disease: Secondary | ICD-10-CM | POA: Diagnosis not present

## 2016-09-06 ENCOUNTER — Telehealth: Payer: Self-pay | Admitting: *Deleted

## 2016-09-06 ENCOUNTER — Ambulatory Visit (INDEPENDENT_AMBULATORY_CARE_PROVIDER_SITE_OTHER): Payer: Medicare Other | Admitting: *Deleted

## 2016-09-06 DIAGNOSIS — N2581 Secondary hyperparathyroidism of renal origin: Secondary | ICD-10-CM | POA: Diagnosis not present

## 2016-09-06 DIAGNOSIS — N179 Acute kidney failure, unspecified: Secondary | ICD-10-CM | POA: Diagnosis not present

## 2016-09-06 DIAGNOSIS — I482 Chronic atrial fibrillation, unspecified: Secondary | ICD-10-CM

## 2016-09-06 DIAGNOSIS — I255 Ischemic cardiomyopathy: Secondary | ICD-10-CM | POA: Diagnosis not present

## 2016-09-06 DIAGNOSIS — Z7901 Long term (current) use of anticoagulants: Secondary | ICD-10-CM | POA: Diagnosis not present

## 2016-09-06 DIAGNOSIS — D509 Iron deficiency anemia, unspecified: Secondary | ICD-10-CM | POA: Diagnosis not present

## 2016-09-06 DIAGNOSIS — N189 Chronic kidney disease, unspecified: Secondary | ICD-10-CM | POA: Diagnosis not present

## 2016-09-06 DIAGNOSIS — Z23 Encounter for immunization: Secondary | ICD-10-CM | POA: Diagnosis not present

## 2016-09-06 LAB — POCT INR: INR: 2.2

## 2016-09-06 NOTE — Telephone Encounter (Signed)
Rodney Cruise PA w/ Dr. Lowanda Foster would like to speak w/ you concerning Mr. Garbacz, please give him a call @ [phone]. It's concerning switching him off of coumadin to eliquis

## 2016-09-06 NOTE — Telephone Encounter (Signed)
Called.  Juanda Crumble will confirm with Dr Lowanda Foster tomorrow that is it OK for pt to stop coumadin and go back on Elquis.  He will call me back and I will notify pt.

## 2016-09-08 DIAGNOSIS — Z23 Encounter for immunization: Secondary | ICD-10-CM | POA: Diagnosis not present

## 2016-09-08 DIAGNOSIS — D509 Iron deficiency anemia, unspecified: Secondary | ICD-10-CM | POA: Diagnosis not present

## 2016-09-08 DIAGNOSIS — N189 Chronic kidney disease, unspecified: Secondary | ICD-10-CM | POA: Diagnosis not present

## 2016-09-08 DIAGNOSIS — N179 Acute kidney failure, unspecified: Secondary | ICD-10-CM | POA: Diagnosis not present

## 2016-09-08 DIAGNOSIS — N2581 Secondary hyperparathyroidism of renal origin: Secondary | ICD-10-CM | POA: Diagnosis not present

## 2016-09-10 DIAGNOSIS — N2581 Secondary hyperparathyroidism of renal origin: Secondary | ICD-10-CM | POA: Diagnosis not present

## 2016-09-10 DIAGNOSIS — D509 Iron deficiency anemia, unspecified: Secondary | ICD-10-CM | POA: Diagnosis not present

## 2016-09-10 DIAGNOSIS — N179 Acute kidney failure, unspecified: Secondary | ICD-10-CM | POA: Diagnosis not present

## 2016-09-10 DIAGNOSIS — Z23 Encounter for immunization: Secondary | ICD-10-CM | POA: Diagnosis not present

## 2016-09-10 DIAGNOSIS — N189 Chronic kidney disease, unspecified: Secondary | ICD-10-CM | POA: Diagnosis not present

## 2016-09-13 DIAGNOSIS — Z23 Encounter for immunization: Secondary | ICD-10-CM | POA: Diagnosis not present

## 2016-09-13 DIAGNOSIS — N2581 Secondary hyperparathyroidism of renal origin: Secondary | ICD-10-CM | POA: Diagnosis not present

## 2016-09-13 DIAGNOSIS — N179 Acute kidney failure, unspecified: Secondary | ICD-10-CM | POA: Diagnosis not present

## 2016-09-13 DIAGNOSIS — D509 Iron deficiency anemia, unspecified: Secondary | ICD-10-CM | POA: Diagnosis not present

## 2016-09-13 DIAGNOSIS — N189 Chronic kidney disease, unspecified: Secondary | ICD-10-CM | POA: Diagnosis not present

## 2016-09-15 DIAGNOSIS — D509 Iron deficiency anemia, unspecified: Secondary | ICD-10-CM | POA: Diagnosis not present

## 2016-09-15 DIAGNOSIS — N179 Acute kidney failure, unspecified: Secondary | ICD-10-CM | POA: Diagnosis not present

## 2016-09-15 DIAGNOSIS — N2581 Secondary hyperparathyroidism of renal origin: Secondary | ICD-10-CM | POA: Diagnosis not present

## 2016-09-15 DIAGNOSIS — N189 Chronic kidney disease, unspecified: Secondary | ICD-10-CM | POA: Diagnosis not present

## 2016-09-15 DIAGNOSIS — Z23 Encounter for immunization: Secondary | ICD-10-CM | POA: Diagnosis not present

## 2016-09-17 DIAGNOSIS — D509 Iron deficiency anemia, unspecified: Secondary | ICD-10-CM | POA: Diagnosis not present

## 2016-09-17 DIAGNOSIS — N179 Acute kidney failure, unspecified: Secondary | ICD-10-CM | POA: Diagnosis not present

## 2016-09-17 DIAGNOSIS — Z23 Encounter for immunization: Secondary | ICD-10-CM | POA: Diagnosis not present

## 2016-09-17 DIAGNOSIS — N189 Chronic kidney disease, unspecified: Secondary | ICD-10-CM | POA: Diagnosis not present

## 2016-09-17 DIAGNOSIS — N2581 Secondary hyperparathyroidism of renal origin: Secondary | ICD-10-CM | POA: Diagnosis not present

## 2016-09-20 ENCOUNTER — Ambulatory Visit (INDEPENDENT_AMBULATORY_CARE_PROVIDER_SITE_OTHER): Payer: Medicare Other | Admitting: *Deleted

## 2016-09-20 DIAGNOSIS — I482 Chronic atrial fibrillation, unspecified: Secondary | ICD-10-CM

## 2016-09-20 DIAGNOSIS — Z7901 Long term (current) use of anticoagulants: Secondary | ICD-10-CM

## 2016-09-20 DIAGNOSIS — D509 Iron deficiency anemia, unspecified: Secondary | ICD-10-CM | POA: Diagnosis not present

## 2016-09-20 DIAGNOSIS — I255 Ischemic cardiomyopathy: Secondary | ICD-10-CM | POA: Diagnosis not present

## 2016-09-20 DIAGNOSIS — N179 Acute kidney failure, unspecified: Secondary | ICD-10-CM | POA: Diagnosis not present

## 2016-09-20 DIAGNOSIS — Z23 Encounter for immunization: Secondary | ICD-10-CM | POA: Diagnosis not present

## 2016-09-20 DIAGNOSIS — N2581 Secondary hyperparathyroidism of renal origin: Secondary | ICD-10-CM | POA: Diagnosis not present

## 2016-09-20 DIAGNOSIS — N189 Chronic kidney disease, unspecified: Secondary | ICD-10-CM | POA: Diagnosis not present

## 2016-09-20 LAB — POCT INR: INR: 1.4

## 2016-09-20 MED ORDER — APIXABAN 5 MG PO TABS
5.0000 mg | ORAL_TABLET | Freq: Two times a day (BID) | ORAL | 6 refills | Status: DC
Start: 2016-09-20 — End: 2017-04-28

## 2016-09-21 ENCOUNTER — Ambulatory Visit (INDEPENDENT_AMBULATORY_CARE_PROVIDER_SITE_OTHER): Payer: Medicare Other | Admitting: *Deleted

## 2016-09-21 DIAGNOSIS — I255 Ischemic cardiomyopathy: Secondary | ICD-10-CM | POA: Diagnosis not present

## 2016-09-21 NOTE — Progress Notes (Signed)
Remote ICD transmission.   

## 2016-09-22 ENCOUNTER — Encounter: Payer: Self-pay | Admitting: Cardiology

## 2016-09-22 DIAGNOSIS — N189 Chronic kidney disease, unspecified: Secondary | ICD-10-CM | POA: Diagnosis not present

## 2016-09-22 DIAGNOSIS — Z23 Encounter for immunization: Secondary | ICD-10-CM | POA: Diagnosis not present

## 2016-09-22 DIAGNOSIS — D509 Iron deficiency anemia, unspecified: Secondary | ICD-10-CM | POA: Diagnosis not present

## 2016-09-22 DIAGNOSIS — N179 Acute kidney failure, unspecified: Secondary | ICD-10-CM | POA: Diagnosis not present

## 2016-09-22 DIAGNOSIS — N2581 Secondary hyperparathyroidism of renal origin: Secondary | ICD-10-CM | POA: Diagnosis not present

## 2016-09-24 DIAGNOSIS — D509 Iron deficiency anemia, unspecified: Secondary | ICD-10-CM | POA: Diagnosis not present

## 2016-09-24 DIAGNOSIS — N189 Chronic kidney disease, unspecified: Secondary | ICD-10-CM | POA: Diagnosis not present

## 2016-09-24 DIAGNOSIS — N179 Acute kidney failure, unspecified: Secondary | ICD-10-CM | POA: Diagnosis not present

## 2016-09-24 DIAGNOSIS — Z23 Encounter for immunization: Secondary | ICD-10-CM | POA: Diagnosis not present

## 2016-09-24 DIAGNOSIS — N2581 Secondary hyperparathyroidism of renal origin: Secondary | ICD-10-CM | POA: Diagnosis not present

## 2016-09-27 DIAGNOSIS — N2581 Secondary hyperparathyroidism of renal origin: Secondary | ICD-10-CM | POA: Diagnosis not present

## 2016-09-27 DIAGNOSIS — N179 Acute kidney failure, unspecified: Secondary | ICD-10-CM | POA: Diagnosis not present

## 2016-09-27 DIAGNOSIS — N189 Chronic kidney disease, unspecified: Secondary | ICD-10-CM | POA: Diagnosis not present

## 2016-09-27 DIAGNOSIS — D509 Iron deficiency anemia, unspecified: Secondary | ICD-10-CM | POA: Diagnosis not present

## 2016-09-27 DIAGNOSIS — Z23 Encounter for immunization: Secondary | ICD-10-CM | POA: Diagnosis not present

## 2016-09-29 DIAGNOSIS — N2581 Secondary hyperparathyroidism of renal origin: Secondary | ICD-10-CM | POA: Diagnosis not present

## 2016-09-29 DIAGNOSIS — N189 Chronic kidney disease, unspecified: Secondary | ICD-10-CM | POA: Diagnosis not present

## 2016-09-29 DIAGNOSIS — D509 Iron deficiency anemia, unspecified: Secondary | ICD-10-CM | POA: Diagnosis not present

## 2016-09-29 DIAGNOSIS — Z23 Encounter for immunization: Secondary | ICD-10-CM | POA: Diagnosis not present

## 2016-09-29 DIAGNOSIS — N179 Acute kidney failure, unspecified: Secondary | ICD-10-CM | POA: Diagnosis not present

## 2016-10-01 DIAGNOSIS — N179 Acute kidney failure, unspecified: Secondary | ICD-10-CM | POA: Diagnosis not present

## 2016-10-01 DIAGNOSIS — N2581 Secondary hyperparathyroidism of renal origin: Secondary | ICD-10-CM | POA: Diagnosis not present

## 2016-10-01 DIAGNOSIS — Z23 Encounter for immunization: Secondary | ICD-10-CM | POA: Diagnosis not present

## 2016-10-01 DIAGNOSIS — D509 Iron deficiency anemia, unspecified: Secondary | ICD-10-CM | POA: Diagnosis not present

## 2016-10-01 DIAGNOSIS — N189 Chronic kidney disease, unspecified: Secondary | ICD-10-CM | POA: Diagnosis not present

## 2016-10-04 DIAGNOSIS — Z23 Encounter for immunization: Secondary | ICD-10-CM | POA: Diagnosis not present

## 2016-10-04 DIAGNOSIS — N179 Acute kidney failure, unspecified: Secondary | ICD-10-CM | POA: Diagnosis not present

## 2016-10-04 DIAGNOSIS — N2581 Secondary hyperparathyroidism of renal origin: Secondary | ICD-10-CM | POA: Diagnosis not present

## 2016-10-04 DIAGNOSIS — D509 Iron deficiency anemia, unspecified: Secondary | ICD-10-CM | POA: Diagnosis not present

## 2016-10-04 DIAGNOSIS — N189 Chronic kidney disease, unspecified: Secondary | ICD-10-CM | POA: Diagnosis not present

## 2016-10-05 DIAGNOSIS — Z992 Dependence on renal dialysis: Secondary | ICD-10-CM | POA: Diagnosis not present

## 2016-10-05 DIAGNOSIS — N186 End stage renal disease: Secondary | ICD-10-CM | POA: Diagnosis not present

## 2016-10-06 DIAGNOSIS — C61 Malignant neoplasm of prostate: Secondary | ICD-10-CM | POA: Diagnosis not present

## 2016-10-08 ENCOUNTER — Encounter: Payer: Self-pay | Admitting: Cardiology

## 2016-10-08 DIAGNOSIS — N179 Acute kidney failure, unspecified: Secondary | ICD-10-CM | POA: Diagnosis not present

## 2016-10-08 DIAGNOSIS — D509 Iron deficiency anemia, unspecified: Secondary | ICD-10-CM | POA: Diagnosis not present

## 2016-10-08 DIAGNOSIS — N2581 Secondary hyperparathyroidism of renal origin: Secondary | ICD-10-CM | POA: Diagnosis not present

## 2016-10-08 DIAGNOSIS — N189 Chronic kidney disease, unspecified: Secondary | ICD-10-CM | POA: Diagnosis not present

## 2016-10-08 DIAGNOSIS — Z23 Encounter for immunization: Secondary | ICD-10-CM | POA: Diagnosis not present

## 2016-10-11 DIAGNOSIS — N2581 Secondary hyperparathyroidism of renal origin: Secondary | ICD-10-CM | POA: Diagnosis not present

## 2016-10-11 DIAGNOSIS — N189 Chronic kidney disease, unspecified: Secondary | ICD-10-CM | POA: Diagnosis not present

## 2016-10-11 DIAGNOSIS — N179 Acute kidney failure, unspecified: Secondary | ICD-10-CM | POA: Diagnosis not present

## 2016-10-11 DIAGNOSIS — Z23 Encounter for immunization: Secondary | ICD-10-CM | POA: Diagnosis not present

## 2016-10-11 DIAGNOSIS — D509 Iron deficiency anemia, unspecified: Secondary | ICD-10-CM | POA: Diagnosis not present

## 2016-10-13 DIAGNOSIS — Z992 Dependence on renal dialysis: Secondary | ICD-10-CM | POA: Diagnosis not present

## 2016-10-13 DIAGNOSIS — D509 Iron deficiency anemia, unspecified: Secondary | ICD-10-CM | POA: Diagnosis not present

## 2016-10-13 DIAGNOSIS — N179 Acute kidney failure, unspecified: Secondary | ICD-10-CM | POA: Diagnosis not present

## 2016-10-13 DIAGNOSIS — N189 Chronic kidney disease, unspecified: Secondary | ICD-10-CM | POA: Diagnosis not present

## 2016-10-13 DIAGNOSIS — Z23 Encounter for immunization: Secondary | ICD-10-CM | POA: Diagnosis not present

## 2016-10-13 DIAGNOSIS — N2581 Secondary hyperparathyroidism of renal origin: Secondary | ICD-10-CM | POA: Diagnosis not present

## 2016-10-13 DIAGNOSIS — N186 End stage renal disease: Secondary | ICD-10-CM | POA: Diagnosis not present

## 2016-10-18 DIAGNOSIS — Z23 Encounter for immunization: Secondary | ICD-10-CM | POA: Diagnosis not present

## 2016-10-18 DIAGNOSIS — N2581 Secondary hyperparathyroidism of renal origin: Secondary | ICD-10-CM | POA: Diagnosis not present

## 2016-10-18 DIAGNOSIS — D509 Iron deficiency anemia, unspecified: Secondary | ICD-10-CM | POA: Diagnosis not present

## 2016-10-18 DIAGNOSIS — N189 Chronic kidney disease, unspecified: Secondary | ICD-10-CM | POA: Diagnosis not present

## 2016-10-18 DIAGNOSIS — N179 Acute kidney failure, unspecified: Secondary | ICD-10-CM | POA: Diagnosis not present

## 2016-10-20 ENCOUNTER — Other Ambulatory Visit (HOSPITAL_COMMUNITY)
Admission: RE | Admit: 2016-10-20 | Discharge: 2016-10-20 | Disposition: A | Discharge status: Home / Self Care | Payer: Medicare Other | Source: Other Acute Inpatient Hospital | Attending: Nephrology | Admitting: Nephrology

## 2016-10-20 DIAGNOSIS — N186 End stage renal disease: Secondary | ICD-10-CM | POA: Diagnosis not present

## 2016-10-20 LAB — BASIC METABOLIC PANEL
ANION GAP: 8 (ref 5–15)
BUN: 41 mg/dL — ABNORMAL HIGH (ref 6–20)
CALCIUM: 9.7 mg/dL (ref 8.9–10.3)
CO2: 23 mmol/L (ref 22–32)
CREATININE: 2.71 mg/dL — AB (ref 0.61–1.24)
Chloride: 101 mmol/L (ref 101–111)
GFR, EST AFRICAN AMERICAN: 27 mL/min — AB (ref >60)
GFR, EST NON AFRICAN AMERICAN: 24 mL/min — AB (ref >60)
GLUCOSE: 394 mg/dL — AB (ref 65–99)
Potassium: 3.6 mmol/L (ref 3.5–5.1)
Sodium: 132 mmol/L — ABNORMAL LOW (ref 135–145)

## 2016-10-21 LAB — CUP PACEART REMOTE DEVICE CHECK
Battery Remaining Longevity: 62 mo
Battery Remaining Percentage: 76 %
Battery Voltage: 2.99 V
HIGH POWER IMPEDANCE MEASURED VALUE: 50 Ohm
HighPow Impedance: 50 Ohm
Implantable Lead Implant Date: 20091026
Implantable Lead Location: 753858
Implantable Lead Location: 753859
Implantable Lead Model: 148
Implantable Lead Serial Number: 125581
Lead Channel Impedance Value: 590 Ohm
Lead Channel Pacing Threshold Amplitude: 1 V
Lead Channel Pacing Threshold Pulse Width: 0.5 ms
Lead Channel Sensing Intrinsic Amplitude: 11.8 mV
Lead Channel Setting Pacing Pulse Width: 0.5 ms
Lead Channel Setting Sensing Sensitivity: 2 mV
MDC IDC LEAD IMPLANT DT: 20020906
MDC IDC LEAD IMPLANT DT: 20091026
MDC IDC LEAD LOCATION: 753860
MDC IDC LEAD MODEL: 4196
MDC IDC MSMT LEADCHNL LV IMPEDANCE VALUE: 540 Ohm
MDC IDC MSMT LEADCHNL LV PACING THRESHOLD AMPLITUDE: 0.75 V
MDC IDC MSMT LEADCHNL RA IMPEDANCE VALUE: 410 Ohm
MDC IDC MSMT LEADCHNL RA SENSING INTR AMPL: 3.1 mV
MDC IDC MSMT LEADCHNL RV PACING THRESHOLD PULSEWIDTH: 0.8 ms
MDC IDC PG IMPLANT DT: 20160505
MDC IDC SESS DTM: 20171017095757
MDC IDC SET LEADCHNL LV PACING AMPLITUDE: 2 V
MDC IDC SET LEADCHNL RV PACING AMPLITUDE: 2.5 V
MDC IDC SET LEADCHNL RV PACING PULSEWIDTH: 0.8 ms
Pulse Gen Serial Number: 7238042

## 2016-10-22 DIAGNOSIS — N185 Chronic kidney disease, stage 5: Secondary | ICD-10-CM | POA: Diagnosis not present

## 2016-10-22 DIAGNOSIS — Z79899 Other long term (current) drug therapy: Secondary | ICD-10-CM | POA: Diagnosis not present

## 2016-10-22 DIAGNOSIS — I10 Essential (primary) hypertension: Secondary | ICD-10-CM | POA: Diagnosis not present

## 2016-10-27 DIAGNOSIS — N184 Chronic kidney disease, stage 4 (severe): Secondary | ICD-10-CM | POA: Diagnosis not present

## 2016-10-27 DIAGNOSIS — I509 Heart failure, unspecified: Secondary | ICD-10-CM | POA: Diagnosis not present

## 2016-10-27 DIAGNOSIS — D638 Anemia in other chronic diseases classified elsewhere: Secondary | ICD-10-CM | POA: Diagnosis not present

## 2016-10-27 DIAGNOSIS — N179 Acute kidney failure, unspecified: Secondary | ICD-10-CM | POA: Diagnosis not present

## 2016-10-27 DIAGNOSIS — I10 Essential (primary) hypertension: Secondary | ICD-10-CM | POA: Diagnosis not present

## 2016-11-01 ENCOUNTER — Encounter: Payer: Self-pay | Admitting: *Deleted

## 2016-11-10 DIAGNOSIS — N184 Chronic kidney disease, stage 4 (severe): Secondary | ICD-10-CM | POA: Diagnosis not present

## 2016-11-10 DIAGNOSIS — Z79899 Other long term (current) drug therapy: Secondary | ICD-10-CM | POA: Diagnosis not present

## 2016-11-10 DIAGNOSIS — E559 Vitamin D deficiency, unspecified: Secondary | ICD-10-CM | POA: Diagnosis not present

## 2016-11-17 ENCOUNTER — Ambulatory Visit (HOSPITAL_COMMUNITY)
Admission: RE | Admit: 2016-11-17 | Discharge: 2016-11-17 | Disposition: A | Discharge status: Home / Self Care | Payer: Medicare Other | Source: Ambulatory Visit | Attending: Internal Medicine | Admitting: Internal Medicine

## 2016-11-17 VITALS — BP 114/64 | HR 72 | Wt 192.2 lb

## 2016-11-17 DIAGNOSIS — Z923 Personal history of irradiation: Secondary | ICD-10-CM | POA: Diagnosis not present

## 2016-11-17 DIAGNOSIS — Z7901 Long term (current) use of anticoagulants: Secondary | ICD-10-CM | POA: Diagnosis not present

## 2016-11-17 DIAGNOSIS — I132 Hypertensive heart and chronic kidney disease with heart failure and with stage 5 chronic kidney disease, or end stage renal disease: Secondary | ICD-10-CM | POA: Insufficient documentation

## 2016-11-17 DIAGNOSIS — I480 Paroxysmal atrial fibrillation: Secondary | ICD-10-CM | POA: Insufficient documentation

## 2016-11-17 DIAGNOSIS — I255 Ischemic cardiomyopathy: Secondary | ICD-10-CM | POA: Diagnosis not present

## 2016-11-17 DIAGNOSIS — Z8546 Personal history of malignant neoplasm of prostate: Secondary | ICD-10-CM | POA: Diagnosis not present

## 2016-11-17 DIAGNOSIS — G4733 Obstructive sleep apnea (adult) (pediatric): Secondary | ICD-10-CM | POA: Diagnosis not present

## 2016-11-17 DIAGNOSIS — Z8673 Personal history of transient ischemic attack (TIA), and cerebral infarction without residual deficits: Secondary | ICD-10-CM | POA: Insufficient documentation

## 2016-11-17 DIAGNOSIS — K219 Gastro-esophageal reflux disease without esophagitis: Secondary | ICD-10-CM | POA: Diagnosis not present

## 2016-11-17 DIAGNOSIS — I252 Old myocardial infarction: Secondary | ICD-10-CM | POA: Insufficient documentation

## 2016-11-17 DIAGNOSIS — Z87820 Personal history of traumatic brain injury: Secondary | ICD-10-CM | POA: Insufficient documentation

## 2016-11-17 DIAGNOSIS — Z955 Presence of coronary angioplasty implant and graft: Secondary | ICD-10-CM | POA: Insufficient documentation

## 2016-11-17 DIAGNOSIS — Z9889 Other specified postprocedural states: Secondary | ICD-10-CM | POA: Insufficient documentation

## 2016-11-17 DIAGNOSIS — K830 Cholangitis: Secondary | ICD-10-CM | POA: Insufficient documentation

## 2016-11-17 DIAGNOSIS — I482 Chronic atrial fibrillation, unspecified: Secondary | ICD-10-CM

## 2016-11-17 DIAGNOSIS — Z794 Long term (current) use of insulin: Secondary | ICD-10-CM | POA: Diagnosis not present

## 2016-11-17 DIAGNOSIS — E1122 Type 2 diabetes mellitus with diabetic chronic kidney disease: Secondary | ICD-10-CM | POA: Diagnosis not present

## 2016-11-17 DIAGNOSIS — I5022 Chronic systolic (congestive) heart failure: Secondary | ICD-10-CM | POA: Diagnosis not present

## 2016-11-17 DIAGNOSIS — Z8249 Family history of ischemic heart disease and other diseases of the circulatory system: Secondary | ICD-10-CM | POA: Insufficient documentation

## 2016-11-17 DIAGNOSIS — I2510 Atherosclerotic heart disease of native coronary artery without angina pectoris: Secondary | ICD-10-CM | POA: Insufficient documentation

## 2016-11-17 DIAGNOSIS — Z933 Colostomy status: Secondary | ICD-10-CM | POA: Diagnosis not present

## 2016-11-17 DIAGNOSIS — Z9221 Personal history of antineoplastic chemotherapy: Secondary | ICD-10-CM | POA: Diagnosis not present

## 2016-11-17 DIAGNOSIS — N186 End stage renal disease: Secondary | ICD-10-CM | POA: Diagnosis not present

## 2016-11-17 DIAGNOSIS — E785 Hyperlipidemia, unspecified: Secondary | ICD-10-CM | POA: Diagnosis not present

## 2016-11-17 DIAGNOSIS — Z85048 Personal history of other malignant neoplasm of rectum, rectosigmoid junction, and anus: Secondary | ICD-10-CM | POA: Diagnosis not present

## 2016-11-17 NOTE — Progress Notes (Signed)
Patient ID: [name], male   DOB: 07-30-1956, 60 y.o.   MRN: [id number]   ADVANCED HF CLINIC NOTE  PCP: Dr. [name] Oncologist: Dr Truett Perna.  CHF: [name]  60 yo with history of CAD and systolic HF due to ischemic cardiomyopathy EF 20-25%, BiV ICD upgrade 2009, chronic atrial fibrillation s/p AVN ablation 9/16, CKD, and traumatic SAH in 1/16 after a fall.  He has been followed at the heart failure clinic at [facility] by Dr Devonne Doughty in the past.    He was admitted in 3/16 from the [facility] ER with exertional dyspnea/volume overload and had a cardiac arrest/ventricular fibrillation while in the hospital terminated by his ICD.  After diuresis, RHC showed relatively normal filling pressures and preserved cardiac index.  Creatinine peaked at 3.0. (was 5.0 in 1/16).  Echo in 3/16 showed EF 25-30% with restrictive diastolic function.  Cardiolite was done, showing areas of scar but no ischemia.  EF 18%.    Admitted 4/16 with hematuria and elevated LFTs. Questionable amiodarone toxicity and this was stopped.   In 5/16 Underwent elective BiV ICD generator change which went without complication. He was admitted again 04/23/15 with acute on chronic CHF and respiratory failure. During that admission he ruled in for a NSTEMI- Troponin 4.29. He has chronic stage 4 CKD and it was decided to obtain a Myoview before considering a cath. Myoview 04/26/15 showed EF 20% with large region of prior infarct involving the apical anterior, anteroseptal, inferoseptal and inferior walls with extension to the true apex. Small region of reversible/inducible ischemia along the margin of the prior infarct at the mid ventricular anterior and anteroseptal walls. No change from previous.   Admitted to [facility] in 8/16 for increased dyspnea and volume overload. Diuresed with IV lasix and transitioned to 40 mg daily. D/C weight  200 pounds.  Redmitted in 9/16 with bleeding from stoma site and atrial fibrillation with RVR.  Eliquis was  held and bleeding stopped.  Eliquis was restarted prior to discharge. He was noted to have < 50% BiV pacing and also to be very volume overloaded. He was then diuresed and had AV nodal ablation.    Readmitted 10/16 with volume overload and worsening renal failure.. Diuresed 32 pounds. Went home 189 pounds. Switched to torsemide on d/c 80/40  Admitted 6/4 -05/12/16 with Abd/chest pain.  Cardiac work up negative. ABD [place] with Cholelithiasis without acute cholecystitis and cirrhosis without ascites. Pt declined HIDA scan. No surgery planned.  Plan on percutaneous drain placement as outpatient if patient were to become symptomatic.  Echo 05/11/16 with LVEF 10-15%, mild AI, mild MR, normal RV. Discharge weight 188 lbs.   Readmitted 7/17 with cholangitis and sepsis. Required ERCP with biliary stent. C/b acute on chronic renal failure requiring HD.   Here for follow-up. Has been off HD since mid November. Creatinine in 2.4-2.7 range. Continues to follow with Dr. Fausto [name]. Taking torsemide 80 daily. Breathing better. No edema, orthopnea, or PND. Struggling with sinus congestion. Back on Eliquis. No bleeding. SBP in 90s at home.   Echo 8/17 EF 15-20%  Corevue: Chronic AF no VT/VF. Fluid slightly elevated since stopping HD   Labs (12/15): LDL 123 Labs (3/16): K 3.8, creatinine 2.78 Labs (03/10/2015): K 4.5 Creatinine 2.49  TSH 4.58, LFTs normal, digoxin 0.8 Labs (04/26/15): K 3.5 creatinine 2.13 Labs (08/04/2015): K 3.3 Creatinine 3.01  Labs (9/16): K 4.1, creatinine 2.7, HCT 30.8 Labs (4/17): K 3.5, creatinine 2.67 Labs (05/12/16): K 3.7, creatinine 2.66 Labs (  10/20/16): K 3.6, creatinine 2.71  PMH: 1. HTN 2. Type II diabetes 3. CAD: s/p BMS LAD in 2001, PTCA ramus and BMS LAD in 2009.  Cardiolite (3/16) with EF 18%, no ischemia, prior anterior, apical and inferior infarction. NSTEMI in 5/16.  Myoview done 04/26/15 showed EF 20% with large region of prior infarct involving the apical anterior, anteroseptal,  inferoseptal and inferior walls with extension to the true apex. Small region of reversible/inducible ischemia along the margin of the prior infarct at the mid ventricular anterior and anteroseptal walls. No change from previous.  4. Chronic systolic CHF: Ischemic CMP.  St Jude CRT-D.  Echo (3/16) with EF 25-30%, restrictive diastolic function, mild LVH, mild MR.  RHC (3/16) with mean RA 9, PA 47/29, mean PCWP 16, CI 2.5 (Fick).  Suspect ACEI cough. Echo (7/17) with EF 15-20%. Myoview 18%.   5. SAH: 1/16 after fall (traumatic).   6. GERD 7. Atrial fibrillation: Paroxysmal initially but now chronic.  Now s/p AV nodal ablation to allow BiV pacing. 8. Rectal cancer: s/p surgery. Has colostomy.  9. Prostate cancer: s/p chemo/radiation.  10. H/o TIA 11. OSA: On CPAP.  12. Hyperlipidemia 13. CKD stage IV: Followed by Dr Kristian Covey.  14. Bleeding from stoma.   SH: Married, lives in [place], nonsmoker.   FH: CAD  ROS: All systems reviewed and negative except as per HPI.   Current Outpatient Prescriptions  Medication Sig Dispense Refill  . apixaban (ELIQUIS) 5 MG TABS tablet Take 1 tablet (5 mg total) by mouth 2 (two) times daily. 60 tablet 6  . atorvastatin (LIPITOR) 80 MG tablet TAKE 1 TABLET BY MOUTH ONCE DAILY. 30 tablet 2  . co-enzyme Q-10 50 MG capsule Take 50 mg by mouth every morning.     . ferrous sulfate 325 (65 FE) MG tablet Take 1 tablet (325 mg total) by mouth 2 (two) times daily with a meal. 60 tablet 0  . insulin glargine (LANTUS) 100 UNIT/ML injection Inject 0.15 mLs (15 Units total) into the skin every morning. 10 mL 11  . metoprolol succinate (TOPROL-XL) 25 MG 24 hr tablet Take 0.5 tablets (12.5 mg total) by mouth 2 (two) times daily. 45 tablet 3  . nitroGLYCERIN (NITROLINGUAL) 0.4 MG/SPRAY spray Place 1 spray under the tongue every 5 (five) minutes x 3 doses as needed for chest pain. Reported on 06/01/2016    . Omega-3 Fatty Acids (FISH OIL) 1000 MG CAPS Take 1,000 mg by mouth 2  (two) times daily.    . pantoprazole (PROTONIX) 40 MG tablet Take 1 tablet (40 mg total) by mouth daily. 30 tablet 0  . potassium chloride SA (K-DUR,KLOR-CON) 20 MEQ tablet Take 20 mEq by mouth daily.    Marland Kitchen PROAIR RESPICLICK 108 (90 BASE) MCG/ACT AEPB Inhale 2 puffs into the lungs every 6 (six) hours as needed (for breathing).   0  . torsemide (DEMADEX) 20 MG tablet Take 80 mg by mouth daily.     No current facility-administered medications for this encounter.     Vitals:   11/17/16 1422  BP: 114/64  Pulse: 72  SpO2: 100%  Weight: 192 lb 4 oz (87.2 kg)    Wt Readings from Last 3 Encounters:  11/17/16 192 lb 4 oz (87.2 kg)  07/16/16 182 lb 12.8 oz (82.9 kg)  06/21/16 182 lb 11.2 oz (82.9 kg)    General: NAD Ambulated in the clinic without difficutly.  Neck: JVP 8cm.   no thyromegaly or thyroid nodule.  Lungs: CTAB,  normal effort.  CV: Nondisplaced PMI.  Regular S1S2, 2/6 SEM RUSB.  Trace -1+ edema.  No carotid bruit.  Normal pedal pulses.  Abdomen: Soft, mildly tender, ND, no HSM appreciated. S/p colostomy.  Skin: Intact without lesions or rashes.  Neurologic: Alert and oriented x 3.  Psych: Normal affect. Extremities: No clubbing or cyanosis.  RUE AVF.  HEENT: Normal.   Assessment/Plan: 1. Chronic systolic CHF: Ischemic cardiomyopathy. Echo 05/11/16 with LVEF 10-15%, mild AI, mild MR, normal RV.  Has been followed for a long time at [facility].  Deemed not transplant or VAD candidate with comorbities and advanced CKD. Has St Jude CRT-D, now s/p AV nodal ablation to promote BiV pacing.  Breathing much better since AV nodal ablation (NYHA class II) - Volume status up slightly can take extra torsemide 40 in afternoon to bring back down, Now off HD - Continue Toprol 12.5 bid. (dose reduced with recent hypotension) - No ACEI, spironolactone, digoxin with advanced CKD.   - Will keep off hydral/NTG for now with low BP.  - Given CKD and previous cancer not good VAD or transplant candidate  (would need heart/kidney)  2. Atrial fibrillation: Chronic. CHADS2Vasc Score 5. s/p AV nodal ablation - Feels great s/p AV node ablation.  - Now back on Eliquis. 3. CAD:  NSTEMI in 5/16.  - Cardiolite without high ischemic burden. EF is decreased from previous, but poor cath candidate with renal failure. - No ASA given AC - Cont statin and Toprol XL.  4. CKD: Stage V. Now on ESRD but may have some kidney recovery - Followed by Dr Kristian Covey in [place].  5.  DM2: followed by Dr. [name] 7. H/O rectal cancer 2008: Has diverting colostomy.  He has follow up with Dr Truett Perna. - Recent CT (05/02/16) with no evidence of recurrence. 8. Hyperlipidemia: - Continue statin  9. Chronic Cholecystitis with recent cholangitis and biliary stent by Dr. Madilyn Fireman. -- Ideally needs cholecystectomy. But low EF puts him at high risk (but not prohibitive) for peri-op CV complications. Have d/w GI and he will f/u with Dr. Madilyn Fireman.  Arvilla Meres, MD 11/17/16

## 2016-11-17 NOTE — Patient Instructions (Signed)
Take 1 Tablet of your Metolazone tonight only  Call Dr. Amedeo Plenty @ [phone] as soon as possible for a follow up appointment  Follow up with [place] in 3 Months

## 2016-11-17 NOTE — Addendum Note (Signed)
Encounter addended by: Kennieth Rad, RN on: 11/17/2016  3:24 PM<BR>    Actions taken: Sign clinical note

## 2016-11-22 ENCOUNTER — Other Ambulatory Visit: Payer: Self-pay | Admitting: Internal Medicine

## 2016-11-23 DIAGNOSIS — Z79899 Other long term (current) drug therapy: Secondary | ICD-10-CM | POA: Diagnosis not present

## 2016-11-23 DIAGNOSIS — I10 Essential (primary) hypertension: Secondary | ICD-10-CM | POA: Diagnosis not present

## 2016-11-23 DIAGNOSIS — R809 Proteinuria, unspecified: Secondary | ICD-10-CM | POA: Diagnosis not present

## 2016-11-23 DIAGNOSIS — D509 Iron deficiency anemia, unspecified: Secondary | ICD-10-CM | POA: Diagnosis not present

## 2016-11-23 DIAGNOSIS — N184 Chronic kidney disease, stage 4 (severe): Secondary | ICD-10-CM | POA: Diagnosis not present

## 2016-11-23 DIAGNOSIS — E559 Vitamin D deficiency, unspecified: Secondary | ICD-10-CM | POA: Diagnosis not present

## 2016-11-24 DIAGNOSIS — D649 Anemia, unspecified: Secondary | ICD-10-CM | POA: Diagnosis not present

## 2016-11-24 DIAGNOSIS — E1129 Type 2 diabetes mellitus with other diabetic kidney complication: Secondary | ICD-10-CM | POA: Diagnosis not present

## 2016-11-24 DIAGNOSIS — N184 Chronic kidney disease, stage 4 (severe): Secondary | ICD-10-CM | POA: Diagnosis not present

## 2016-11-24 DIAGNOSIS — I10 Essential (primary) hypertension: Secondary | ICD-10-CM | POA: Diagnosis not present

## 2016-11-29 IMAGING — DX DG CHEST 2V
2 series · 2 of 2 positions shown · non-contrast
Comparison: Single view of the chest 04/25/2015. PA and lateral
chest 05/19/2014.

CLINICAL DATA: Wheezing beginning last night.  Initial encounter.

EXAM:
CHEST  2 VIEW

[w chest pa]
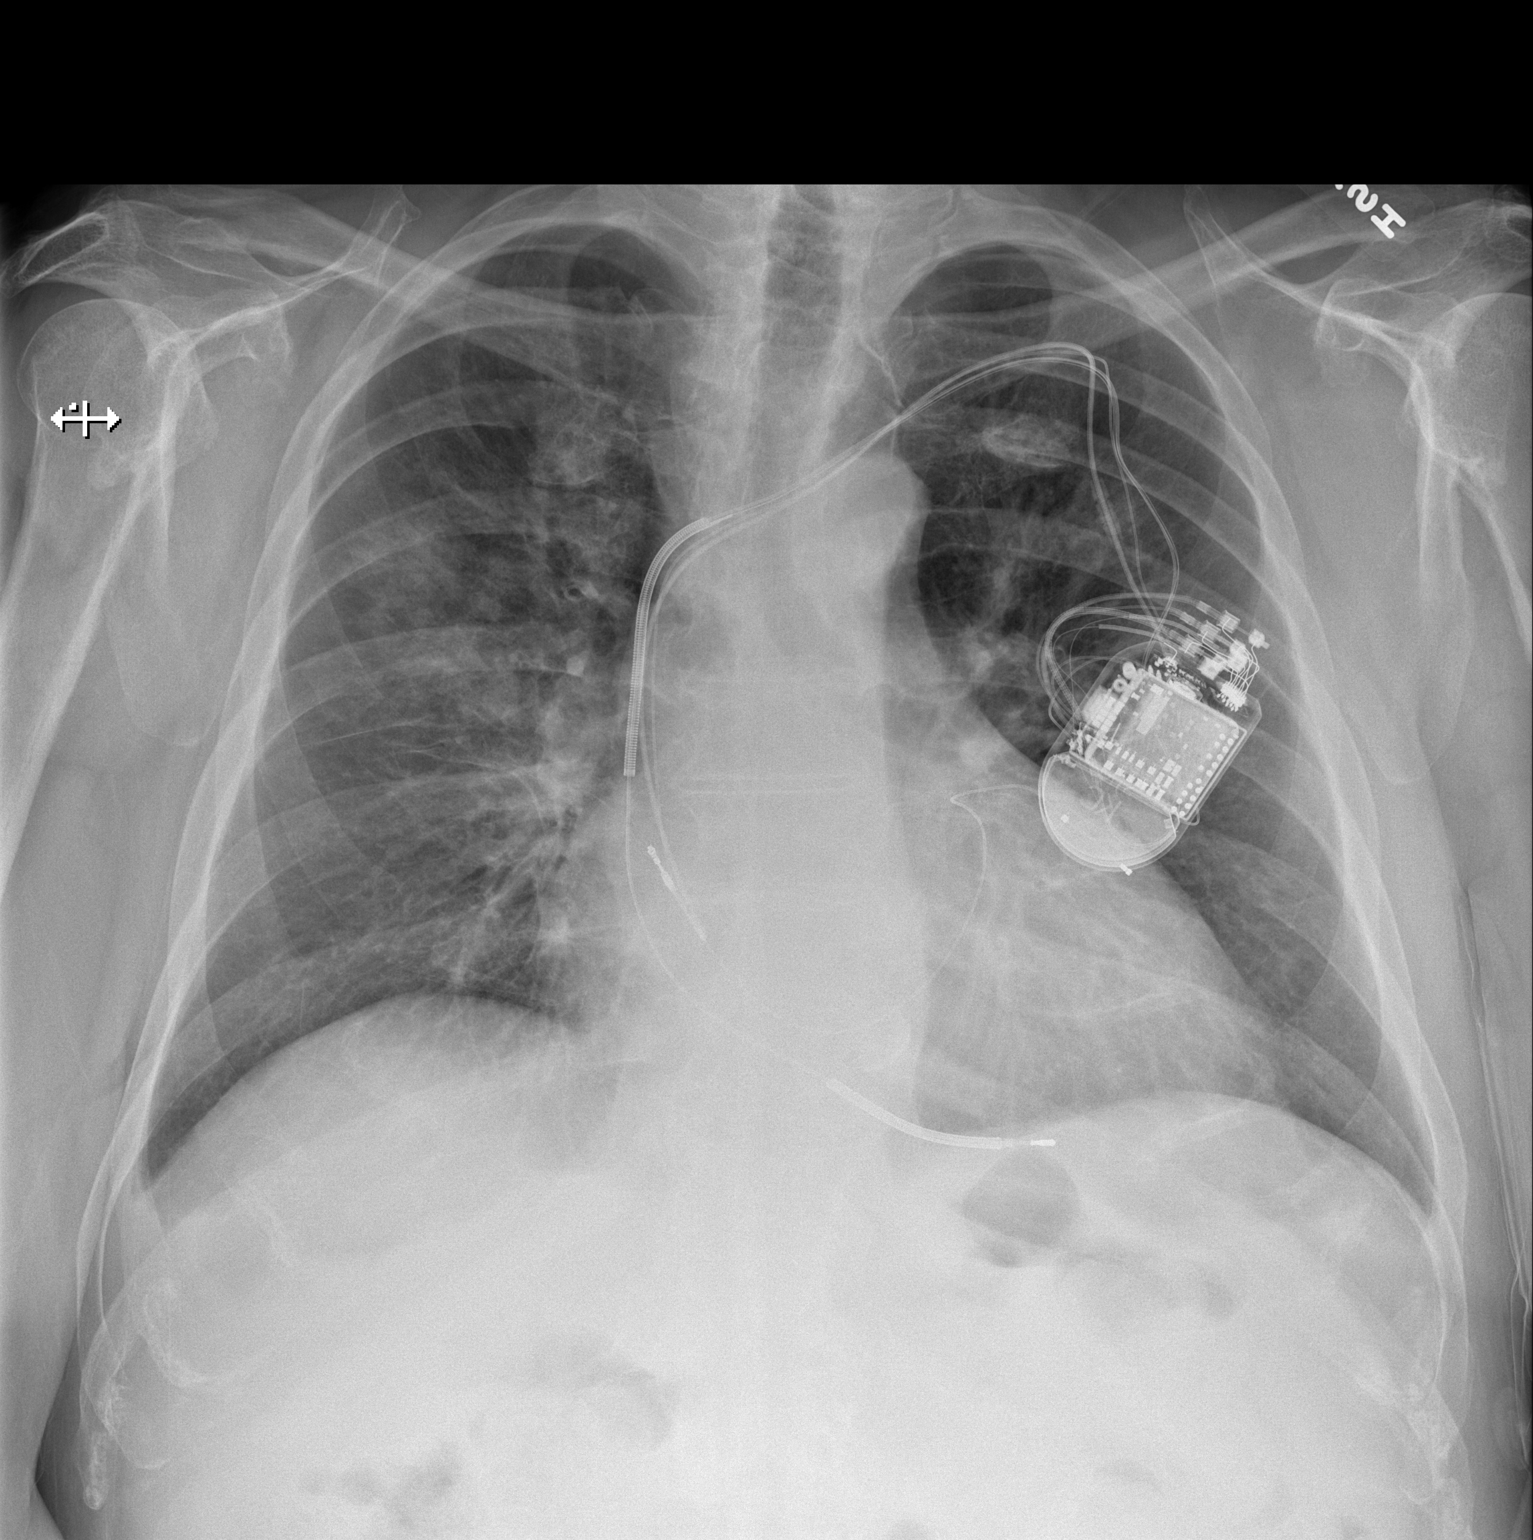

[w chest lat]
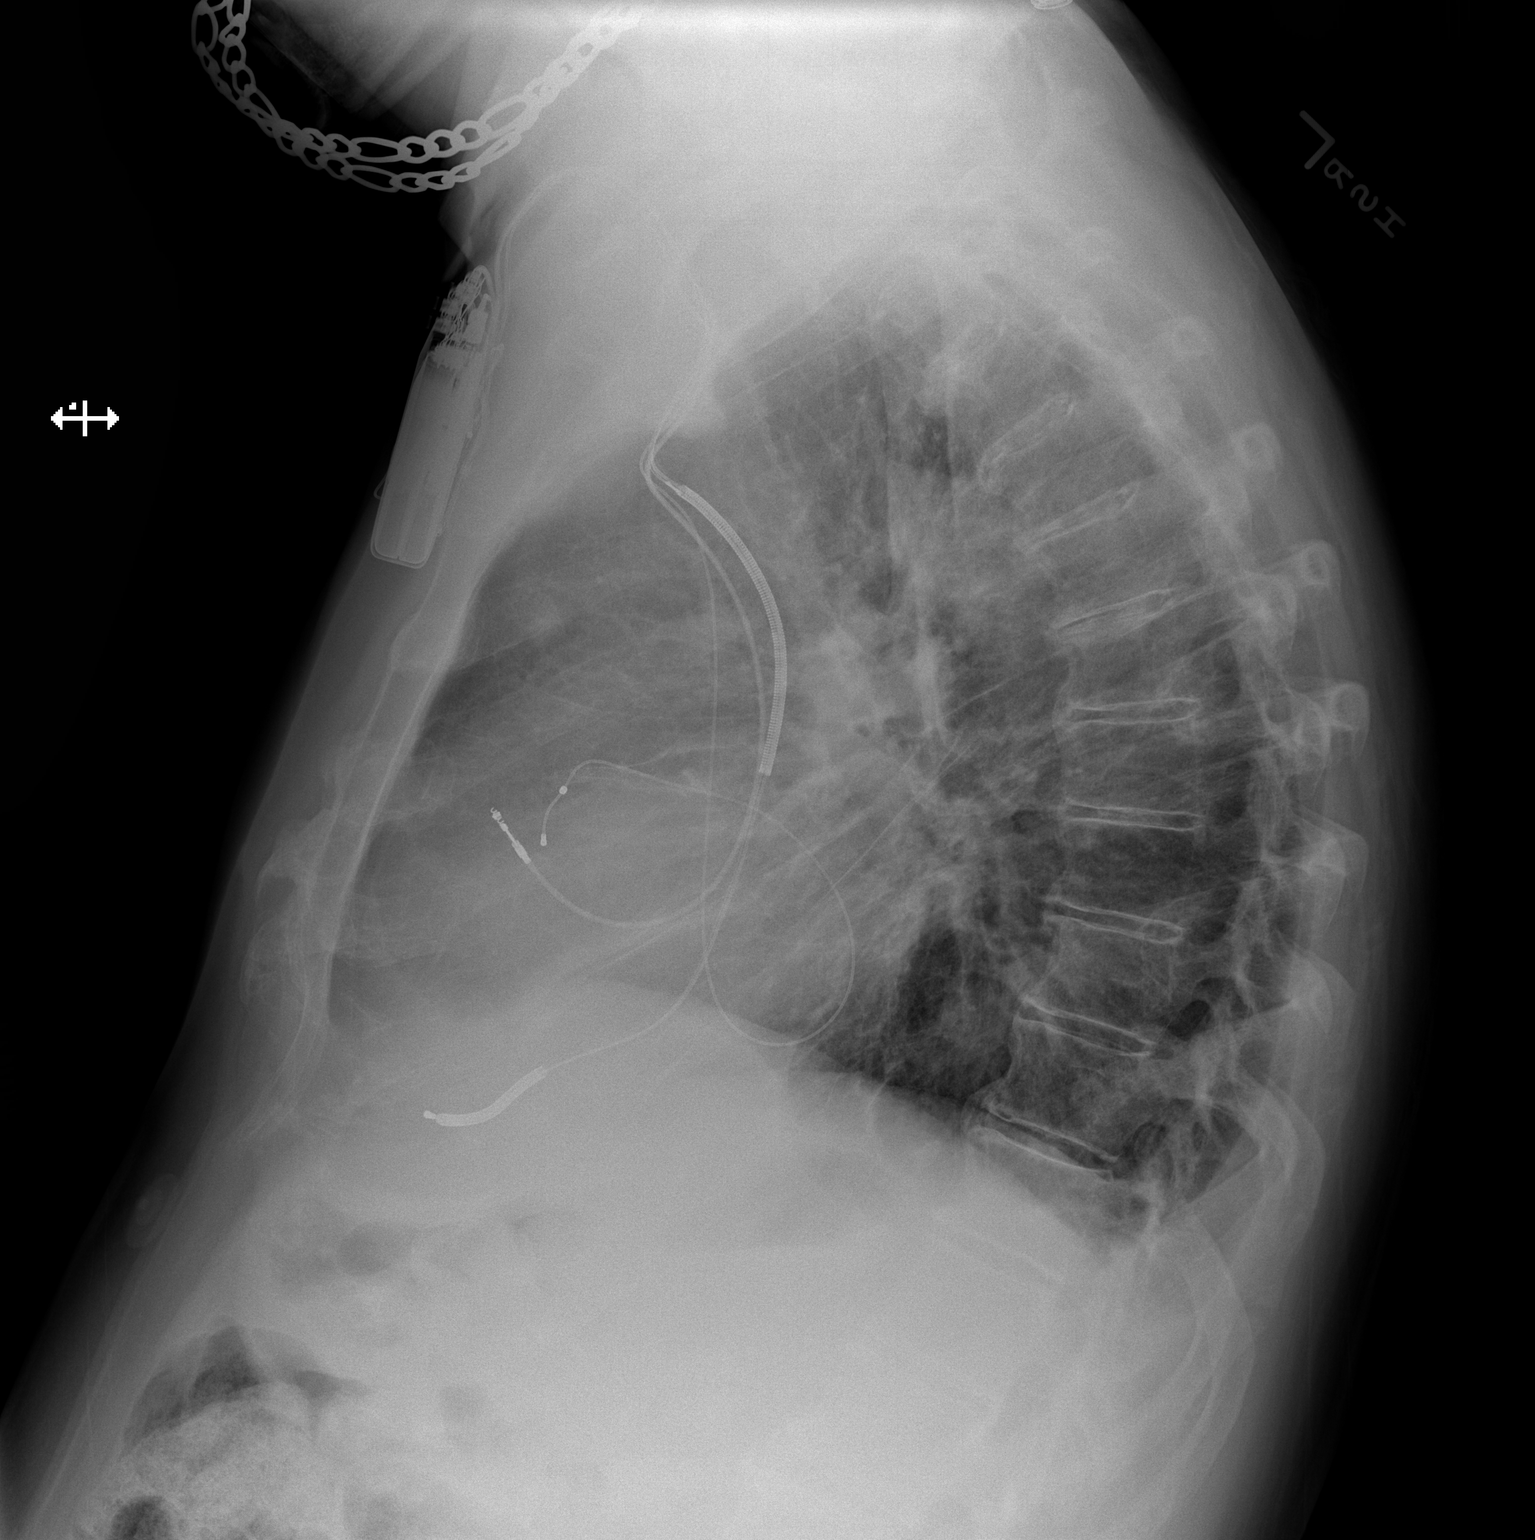

[2 of 2 positions shown; findings below may reference images not displayed]

FINDINGS: Heart size is mildly enlarged. Pacing device is in place and appears
unchanged. Patchy airspace disease is identified in the right upper
lobe. The left lung appears clear. No pneumothorax or pleural
effusion.
IMPRESSION: Patchy right upper lobe airspace disease most worrisome for
pneumonia. Recommend followup to clearing.

## 2016-12-04 ENCOUNTER — Other Ambulatory Visit (HOSPITAL_COMMUNITY): Payer: Self-pay | Admitting: Internal Medicine

## 2016-12-04 DIAGNOSIS — I5022 Chronic systolic (congestive) heart failure: Secondary | ICD-10-CM

## 2016-12-09 IMAGING — CT CT ABD-PELV W/O CM
2 of 4 series · 16 of 46 positions shown, 18 images · non-contrast
Comparison: 04/05/2015

CLINICAL DATA: Followup rectal carcinoma. Previous surgery,
chemotherapy, and radiation therapy. Personal history of prostate
carcinoma.

EXAM:
CT ABDOMEN AND PELVIS WITHOUT CONTRAST
TECHNIQUE: Multidetector CT imaging of the abdomen and pelvis was performed
following the standard protocol without IV contrast.

[Series 2: abdomen/pelvis w/o contrast · axial · non-contrast · 0.87mm/px · z∈[-474,-34]mm · 13 of 96 slices shown, 15 images]
[im 4/96  soft-tissue]
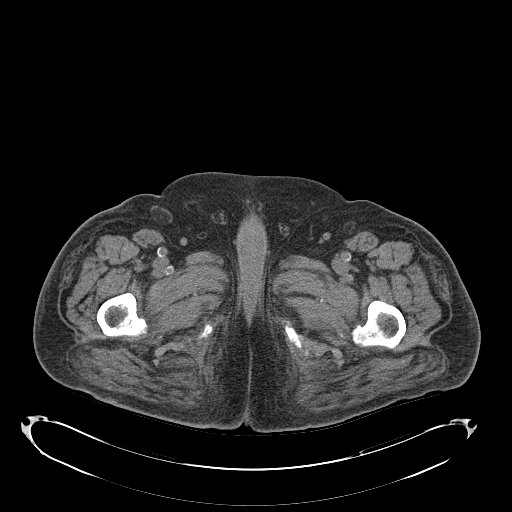
[im 4/96  bone]
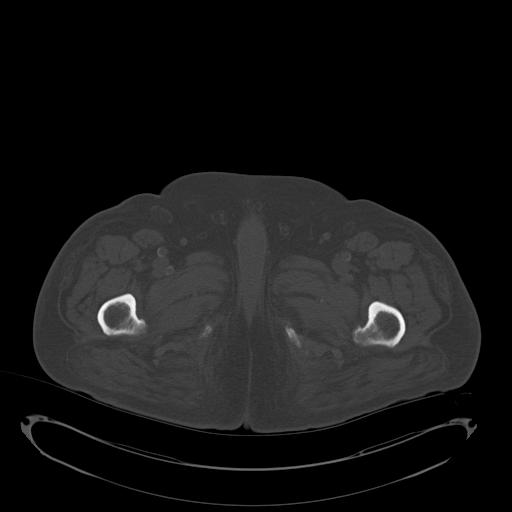
[im 12/96  soft-tissue]
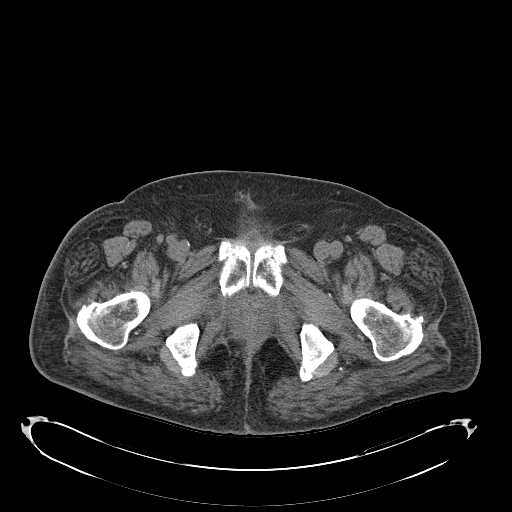
[im 20/96  soft-tissue]
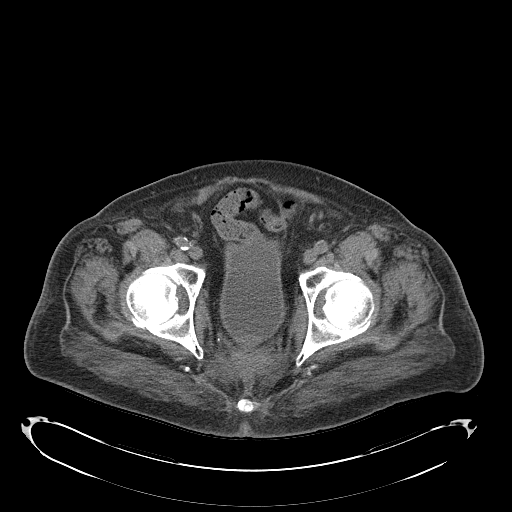
[im 27/96  soft-tissue]
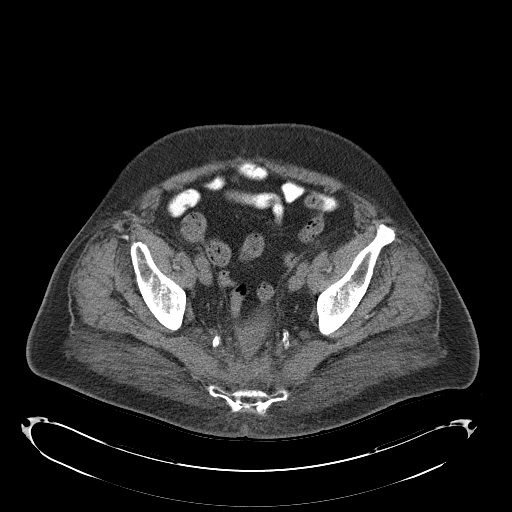
[im 35/96  soft-tissue]
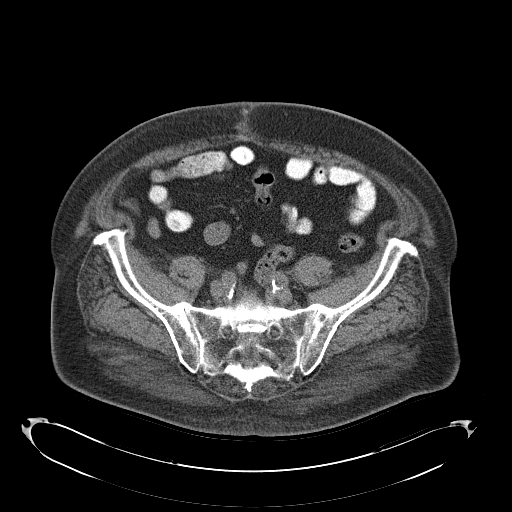
[im 42/96  soft-tissue]
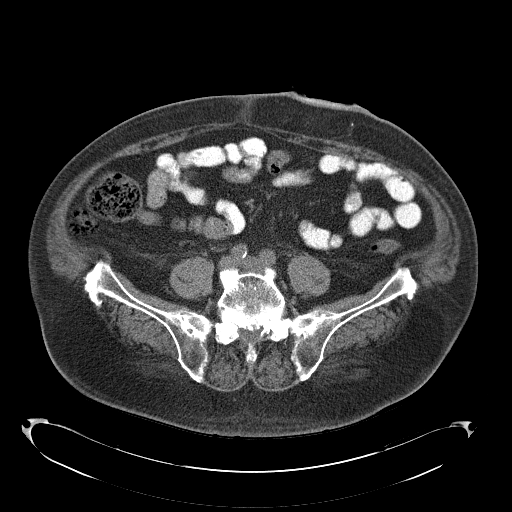
[im 50/96  soft-tissue]
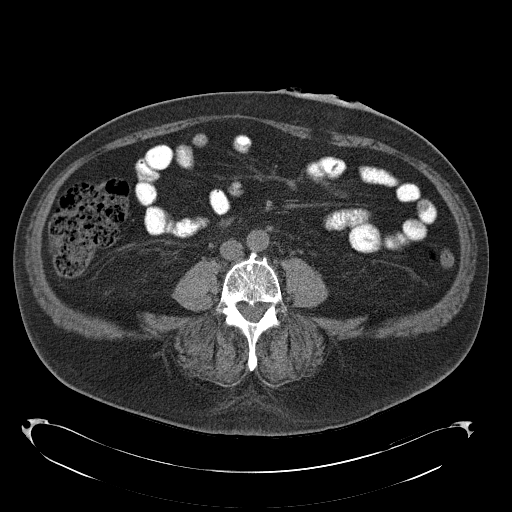
[im 54/96  soft-tissue]
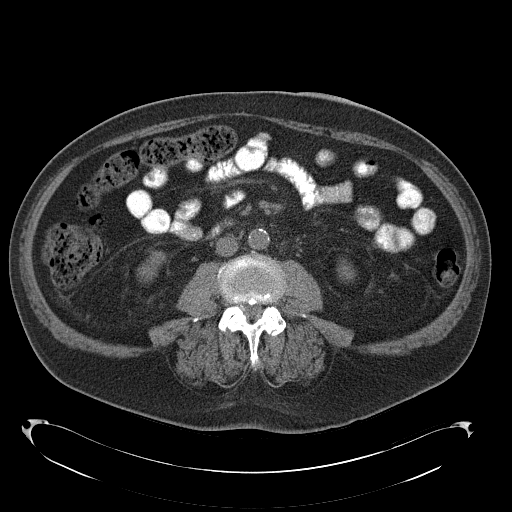
[im 61/96  soft-tissue]
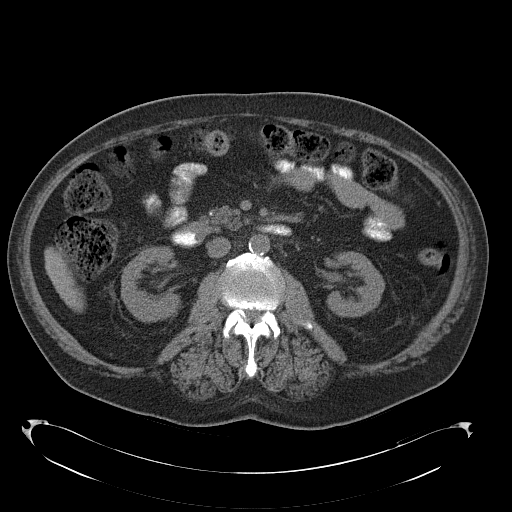
[im 61/96  bone]
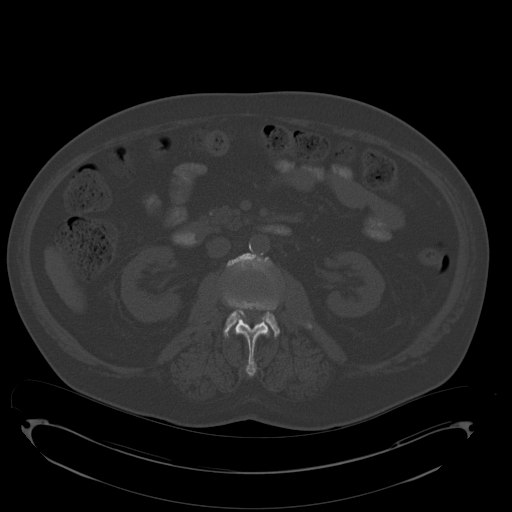
[im 69/96  soft-tissue]
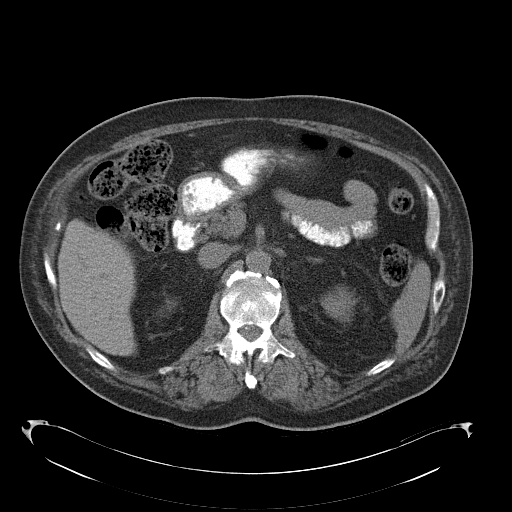
[im 77/96  soft-tissue]
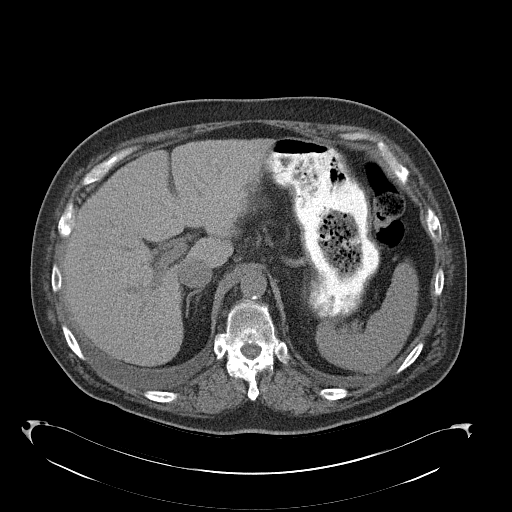
[im 84/96  soft-tissue]
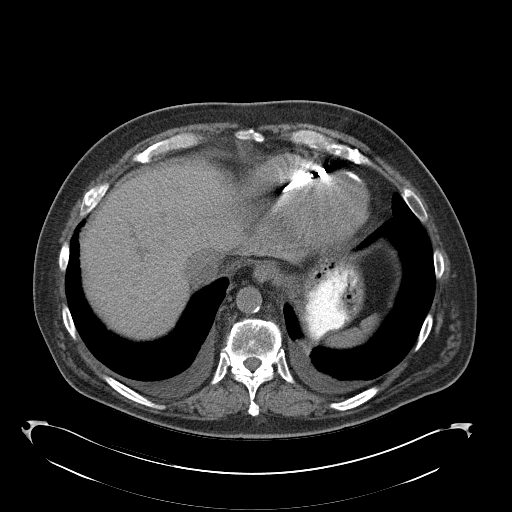
[im 92/96  soft-tissue]
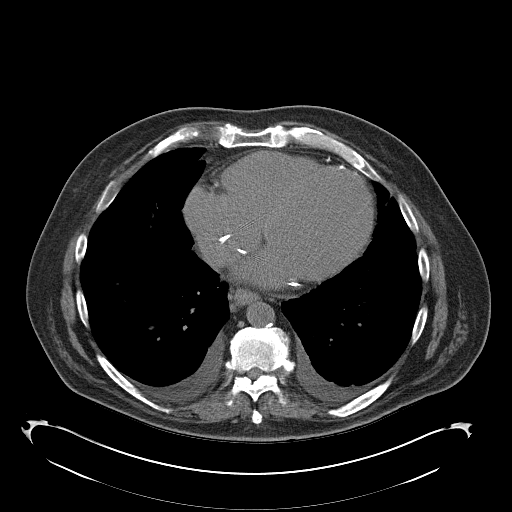

[Series 3: mpr cor 3.0mm · coronal · 0.84mm/px · 3 of 102 slices shown]
[im 34/102  soft-tissue]
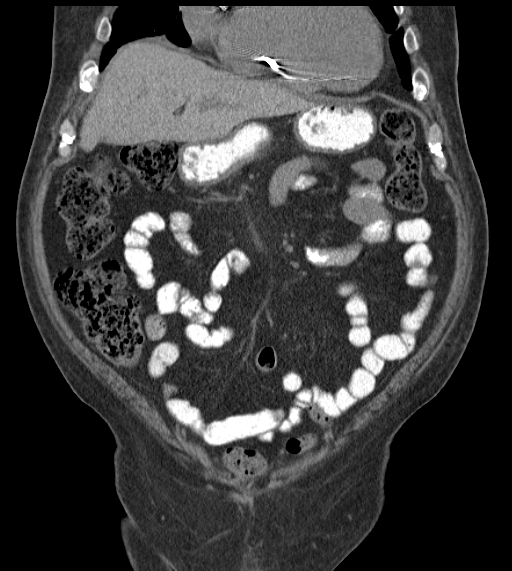
[im 45/102  soft-tissue]
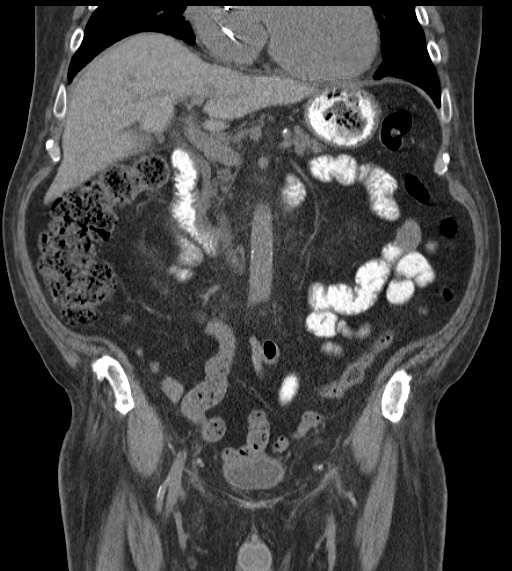
[im 57/102  soft-tissue]
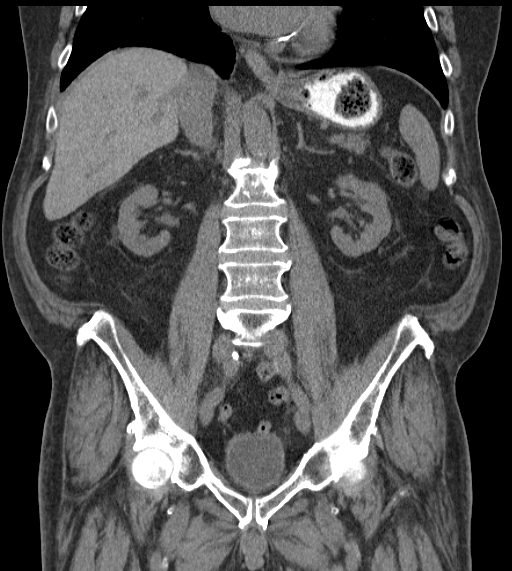

[16 of 46 positions shown; findings below may reference images not displayed]

FINDINGS: Lower chest: Small bilateral pleural effusions show no significant
change.

Hepatobiliary: No mass visualized on this unenhanced exam. Tiny
calcified gallstone again noted, without evidence of cholecystitis
or biliary ductal dilatation.

Pancreas: No mass or inflammatory process visualized on this
unenhanced exam.

Spleen:  Within normal limits in size.

Adrenal Glands:  No masses identified.

Kidneys/Urinary tract: No evidence of urolithiasis or
hydronephrosis.

Stomach/Bowel/Peritoneum: Left lower quadrant colostomy again
demonstrated. Midline presacral soft tissue density is stable an
likely due to post treatment change. There is an asymmetric
ill-defined soft tissue density in the right perirectal region. This
shows mild increase in size since previous study, currently
measuring 2.8 x 2.8 cm on image 79, compared to 2.3 x 2.6 cm
previously. This is suspicious for recurrent carcinoma.

Vascular/Lymphatic: No pathologically enlarged lymph nodes
identified. No abdominal aortic aneurysm or other significant
retroperitoneal abnormality demonstrated.

Reproductive:  Brachytherapy seeds again noted in the prostate.

Other:  None.

Musculoskeletal:  No suspicious bone lesions identified.
IMPRESSION: Mild increase in size of asymmetric right perirectal soft tissue
density, suspicious for recurrent carcinoma.

Stable midline presacral soft tissue density, consistent with post
treatment change.

Cholelithiasis.  No radiographic evidence of cholecystitis.

Stable small bilateral pleural effusions.

## 2016-12-11 IMAGING — CR DG CHEST 2V
2 series · 2 of 2 positions shown · non-contrast
Comparison: 07/21/2015

CLINICAL DATA: Shortness of breath for 2-3 days. Intermittent.
Chest pain.

EXAM:
CHEST  2 VIEW

[chest pa]
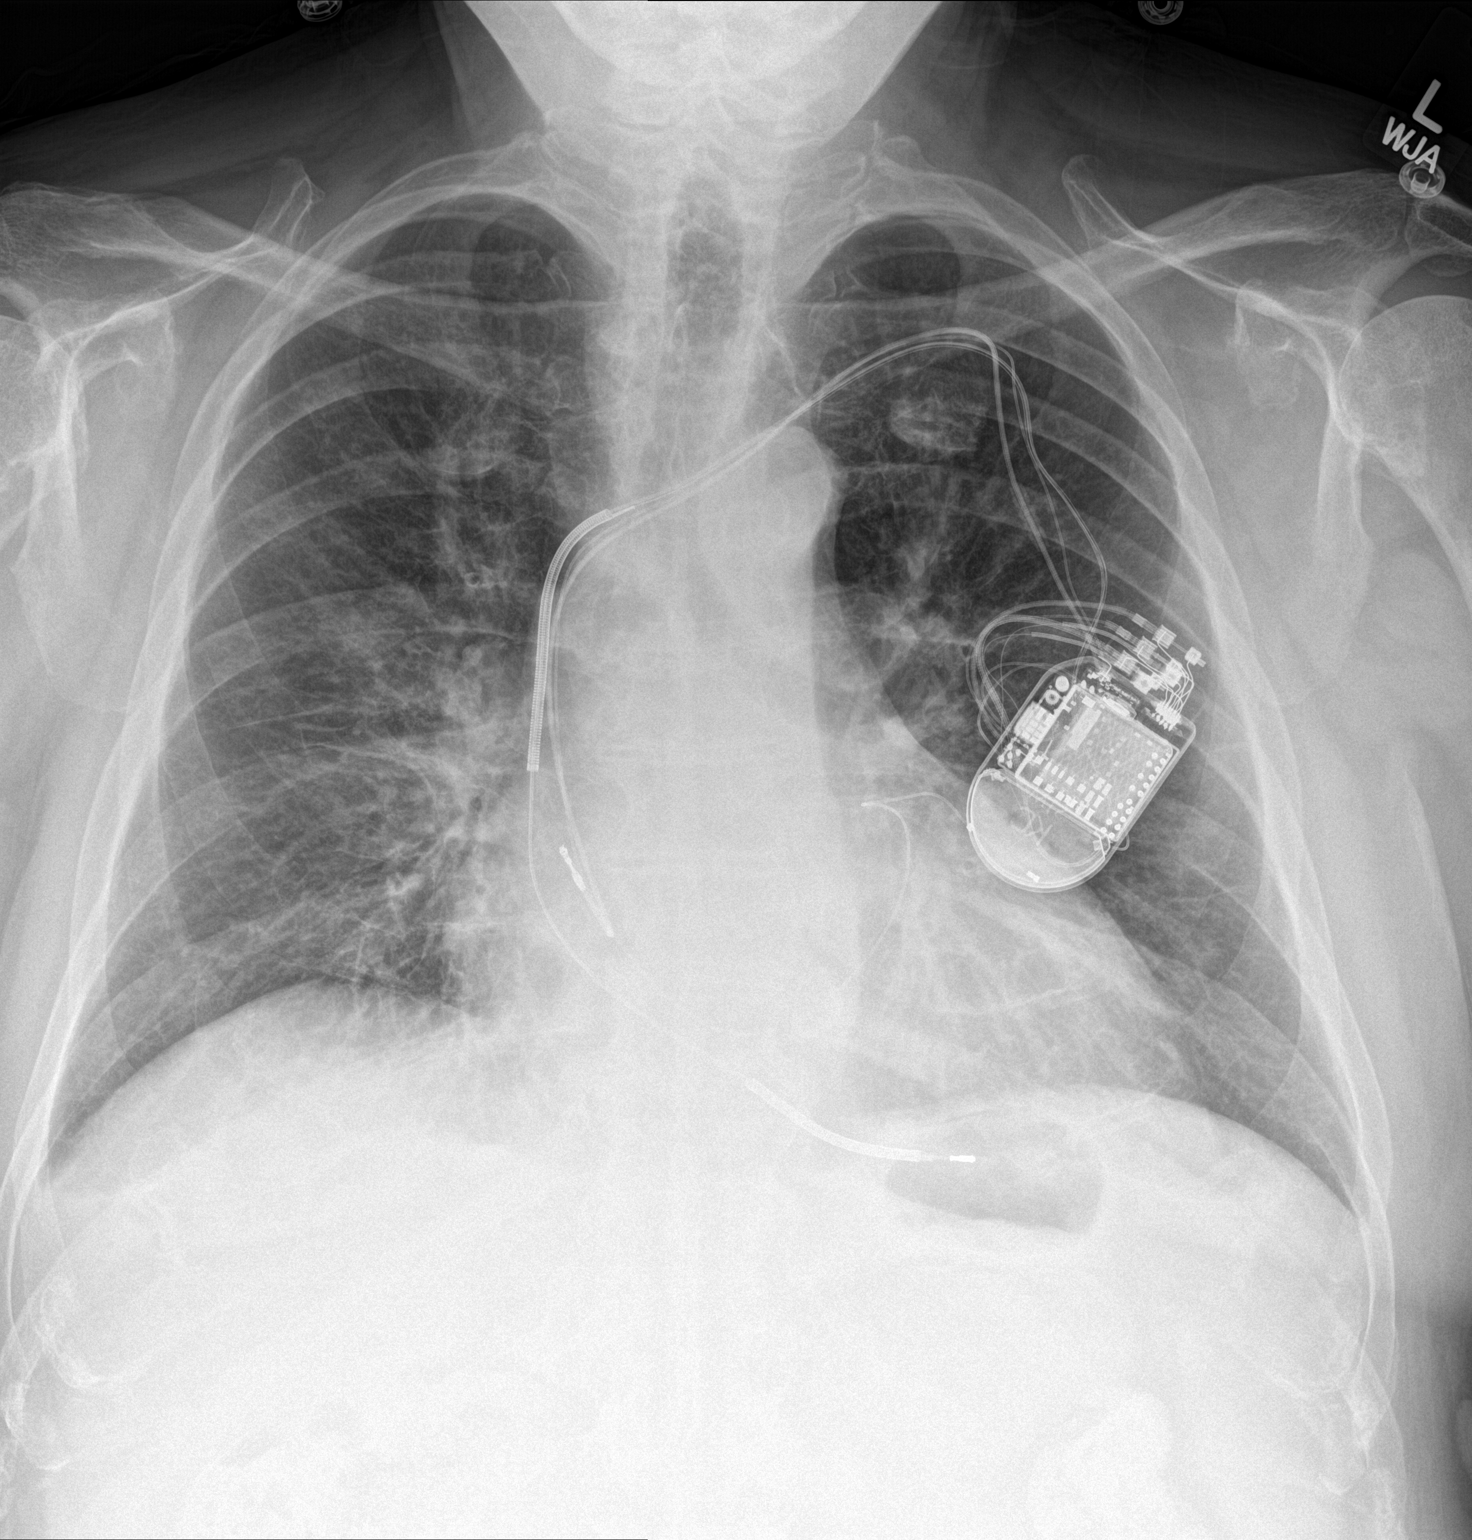

[chest lat]
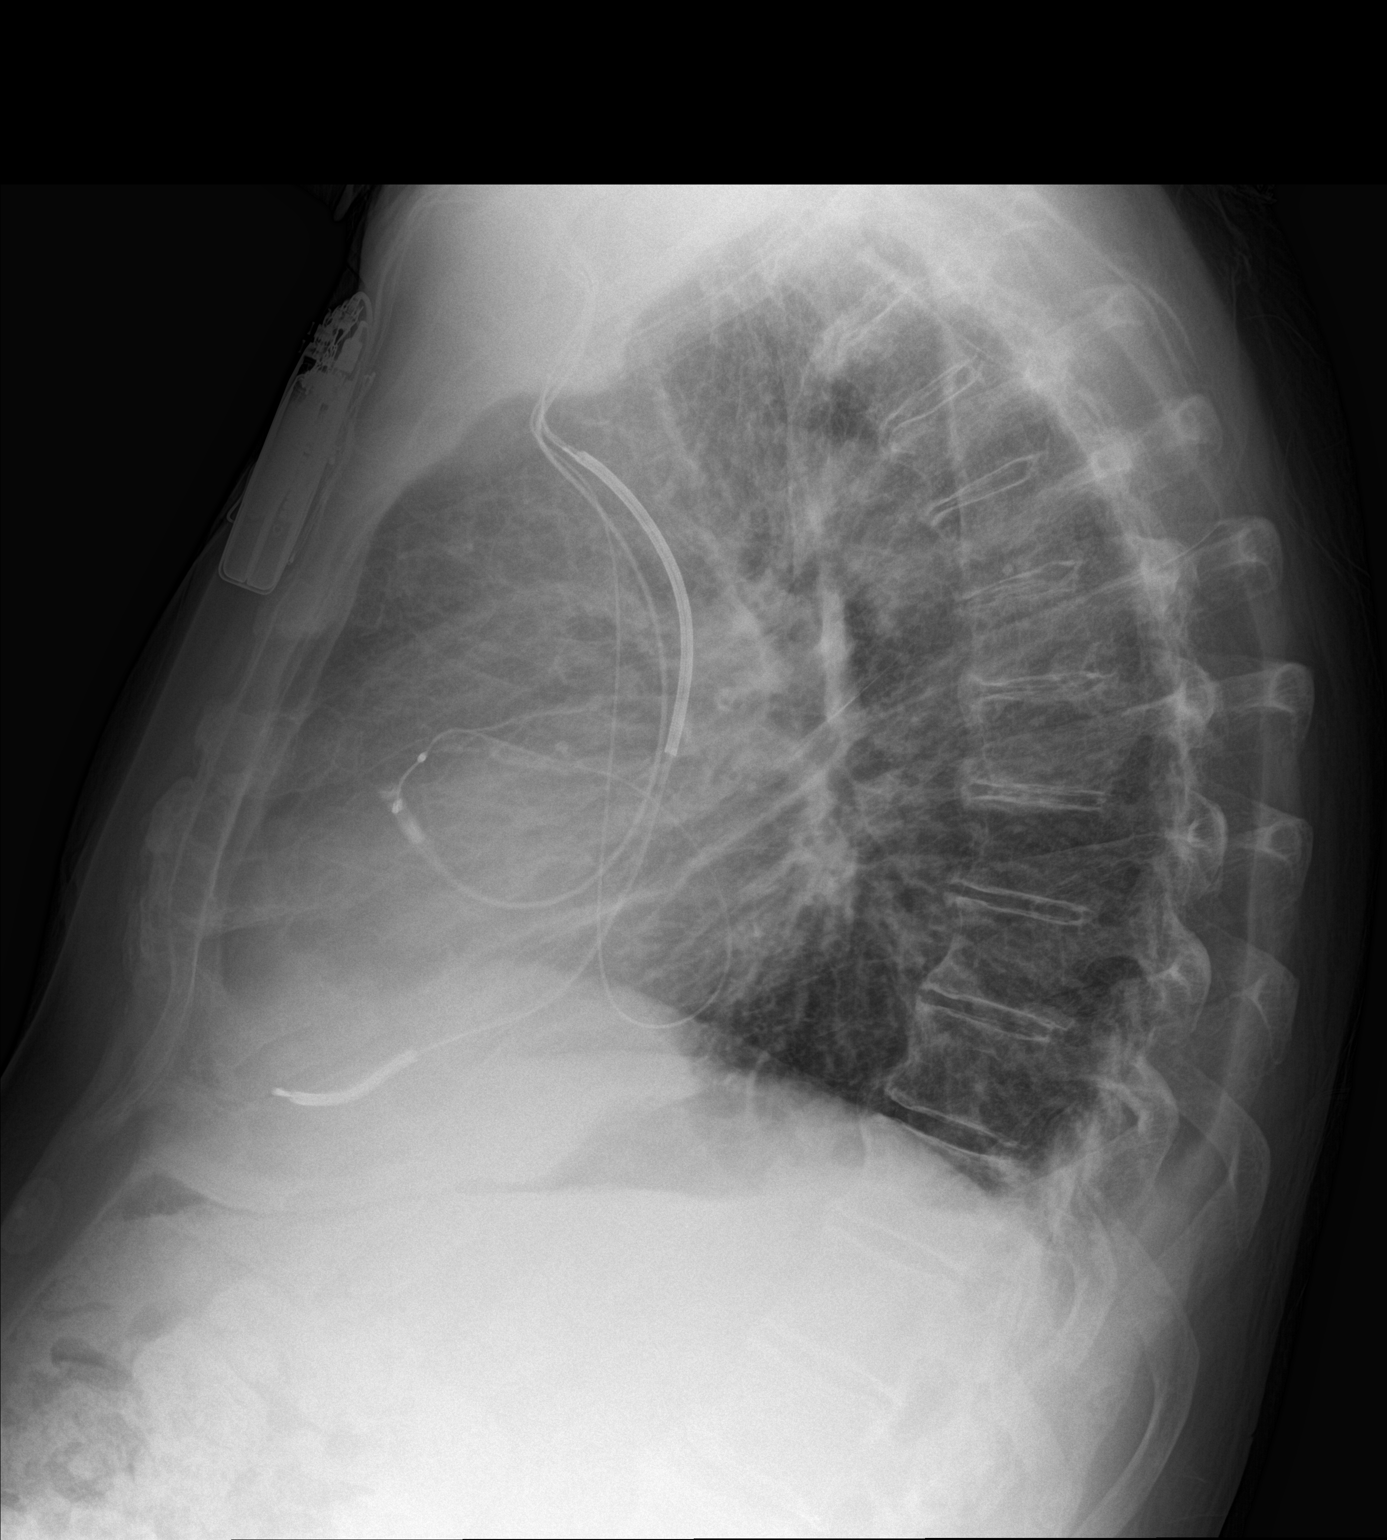

[2 of 2 positions shown; findings below may reference images not displayed]

FINDINGS: Multi lead left-sided pacemaker remains in place. Cardiomegaly is
unchanged. Improving right upper lobe patchy opacity with minimal
residual ill-defined opacity in the perihilar region. The lungs are
hyperinflated. No new consolidation. No pleural effusion or
pneumothorax. No acute osseous abnormalities are seen.
IMPRESSION: 1. Resolving right upper lobe opacity consistent with improving
pneumonia. Minimal residual perihilar opacity persists. Recommend
additional follow-up in 2-3 weeks to ensure or complete clearing.
2. Chronic cardiomegaly.  No acute process.

## 2016-12-17 DIAGNOSIS — E1129 Type 2 diabetes mellitus with other diabetic kidney complication: Secondary | ICD-10-CM | POA: Diagnosis not present

## 2016-12-17 DIAGNOSIS — N184 Chronic kidney disease, stage 4 (severe): Secondary | ICD-10-CM | POA: Diagnosis not present

## 2016-12-17 DIAGNOSIS — I10 Essential (primary) hypertension: Secondary | ICD-10-CM | POA: Diagnosis not present

## 2016-12-21 ENCOUNTER — Ambulatory Visit (INDEPENDENT_AMBULATORY_CARE_PROVIDER_SITE_OTHER): Payer: Medicare Other | Admitting: *Deleted

## 2016-12-21 DIAGNOSIS — I255 Ischemic cardiomyopathy: Secondary | ICD-10-CM

## 2016-12-21 NOTE — Progress Notes (Signed)
Remote ICD transmission.   

## 2016-12-25 LAB — CUP PACEART REMOTE DEVICE CHECK
Battery Remaining Longevity: 59 mo
Battery Voltage: 2.99 V
Date Time Interrogation Session: 20180116070026
HIGH POWER IMPEDANCE MEASURED VALUE: 42 Ohm
HighPow Impedance: 42 Ohm
Implantable Lead Implant Date: 20020906
Implantable Lead Location: 753858
Implantable Lead Location: 753860
Implantable Lead Serial Number: 125581
Implantable Pulse Generator Implant Date: 20160505
Lead Channel Impedance Value: 430 Ohm
Lead Channel Impedance Value: 530 Ohm
Lead Channel Pacing Threshold Amplitude: 0.75 V
Lead Channel Pacing Threshold Amplitude: 1 V
Lead Channel Pacing Threshold Pulse Width: 0.8 ms
Lead Channel Sensing Intrinsic Amplitude: 11.8 mV
Lead Channel Setting Pacing Pulse Width: 0.8 ms
MDC IDC LEAD IMPLANT DT: 20091026
MDC IDC LEAD IMPLANT DT: 20091026
MDC IDC LEAD LOCATION: 753859
MDC IDC MSMT BATTERY REMAINING PERCENTAGE: 72 %
MDC IDC MSMT LEADCHNL LV PACING THRESHOLD PULSEWIDTH: 0.5 ms
MDC IDC MSMT LEADCHNL RA SENSING INTR AMPL: 3.1 mV
MDC IDC MSMT LEADCHNL RV IMPEDANCE VALUE: 580 Ohm
MDC IDC SET LEADCHNL LV PACING AMPLITUDE: 2 V
MDC IDC SET LEADCHNL LV PACING PULSEWIDTH: 0.5 ms
MDC IDC SET LEADCHNL RV PACING AMPLITUDE: 2.5 V
MDC IDC SET LEADCHNL RV SENSING SENSITIVITY: 2 mV
Pulse Gen Serial Number: 7238042

## 2016-12-29 ENCOUNTER — Encounter: Payer: Self-pay | Admitting: Cardiology

## 2016-12-31 DIAGNOSIS — K830 Cholangitis: Secondary | ICD-10-CM | POA: Diagnosis not present

## 2017-01-01 ENCOUNTER — Other Ambulatory Visit (HOSPITAL_COMMUNITY): Payer: Self-pay | Admitting: Internal Medicine

## 2017-01-01 DIAGNOSIS — I5022 Chronic systolic (congestive) heart failure: Secondary | ICD-10-CM

## 2017-01-06 HISTORY — PX: ERCP: SHX60

## 2017-01-07 DIAGNOSIS — D509 Iron deficiency anemia, unspecified: Secondary | ICD-10-CM | POA: Diagnosis not present

## 2017-01-07 DIAGNOSIS — N184 Chronic kidney disease, stage 4 (severe): Secondary | ICD-10-CM | POA: Diagnosis not present

## 2017-01-07 DIAGNOSIS — I10 Essential (primary) hypertension: Secondary | ICD-10-CM | POA: Diagnosis not present

## 2017-01-07 DIAGNOSIS — E1129 Type 2 diabetes mellitus with other diabetic kidney complication: Secondary | ICD-10-CM | POA: Diagnosis not present

## 2017-01-07 DIAGNOSIS — Z79899 Other long term (current) drug therapy: Secondary | ICD-10-CM | POA: Diagnosis not present

## 2017-01-11 ENCOUNTER — Telehealth: Payer: Self-pay | Admitting: Nurse Practitioner

## 2017-01-11 ENCOUNTER — Other Ambulatory Visit (HOSPITAL_BASED_OUTPATIENT_CLINIC_OR_DEPARTMENT_OTHER): Payer: Medicare Other

## 2017-01-11 ENCOUNTER — Ambulatory Visit (HOSPITAL_BASED_OUTPATIENT_CLINIC_OR_DEPARTMENT_OTHER): Payer: Medicare Other | Admitting: Nurse Practitioner

## 2017-01-11 VITALS — BP 99/54 | HR 75 | Temp 97.6°F | Resp 18 | Ht 70.0 in | Wt 185.8 lb

## 2017-01-11 DIAGNOSIS — D696 Thrombocytopenia, unspecified: Secondary | ICD-10-CM | POA: Diagnosis not present

## 2017-01-11 DIAGNOSIS — E119 Type 2 diabetes mellitus without complications: Secondary | ICD-10-CM | POA: Diagnosis not present

## 2017-01-11 DIAGNOSIS — L03116 Cellulitis of left lower limb: Secondary | ICD-10-CM

## 2017-01-11 DIAGNOSIS — Z85048 Personal history of other malignant neoplasm of rectum, rectosigmoid junction, and anus: Secondary | ICD-10-CM

## 2017-01-11 LAB — CEA (IN HOUSE-CHCC): CEA (CHCC-In House): 1.49 ng/mL (ref 0.00–5.00)

## 2017-01-11 MED ORDER — DOXYCYCLINE HYCLATE 100 MG PO TABS: 100.0000 mg | ORAL_TABLET | Freq: Two times a day (BID) | ORAL | 0 refills | Status: DC

## 2017-01-11 NOTE — Progress Notes (Addendum)
[place] OFFICE PROGRESS NOTE   Diagnosis:  Rectal cancer  INTERVAL HISTORY:   [name] returns as scheduled. Colostomy is functioning normally. He notes intermittent irritation of the stoma. No bleeding. No nausea or vomiting. No rectal pain. He reports he was on hemodialysis temporarily. This has been discontinued. He reports a biliary stent will be removed in the near future. He is concerned he has recurrent cellulitis involving the right lower leg. He has noted redness for the past 2 days. No fever.  Objective:  Vital signs in last 24 hours:  Blood pressure (!) 99/54, pulse 75, temperature 97.6 F (36.4 C), temperature source Oral, resp. rate 18, height 5\' 10"  (1.778 m), weight 185 lb 12.8 oz (84.3 kg), SpO2 100 %.    HEENT: No thrush or ulcers. Lymphatics: No palpable cervical, supra clavicular, axillary or inguinal lymph nodes. Prominent biaxillary fat pads. Resp: Lungs clear bilaterally. Cardio: Regular rate and rhythm. GI: Abdomen soft and nontender. No hepatomegaly. Left lower quadrant colostomy. Vascular: Trace bilateral lower leg edema right greater than left. Chronic stasis change bilaterally. Right lower leg with erythema/ecchymosis. Skin: Perineal scar without evidence of recurrent tumor.    Lab Results:  Lab Results  Component Value Date   WBC 7.0 06/21/2016   HGB 9.6 (L) 06/21/2016   HCT 29.0 (L) 06/21/2016   MCV 92.7 06/21/2016   PLT 77 (L) 06/21/2016   NEUTROABS 16.0 (H) 06/14/2016    Imaging:  No results found.  Medications: I have reviewed the patient's current medications.  Assessment/Plan: 1. Stage III rectal cancer, diagnosed in June of 2008: Status post neoadjuvant infusional 5-FU and concurrent radiation. He underwent an APR 10/04/2007 with the pathology confirming stage III disease. He completed 8 cycles of adjuvant FOLFOX therapy on 03/12/2008. A restaging CT 05/26/2010 showed no evidence of metastatic disease. He underwent  a colonoscopy in October of 2012 with removal of a single pedunculated polyp-benign pathology 2. Prostate cancer: Status post radiation and 2 years of Lupron therapy per Dr. Terance Hart. 3. History of thrombocytopenia secondary to chemotherapy. The platelet count was normal on 05/02/12. 4. History of delayed nausea secondary to chemotherapy: Improved with Aloxi. 5. History of oxaliplatin neuropathy. 6. Ischemic cardiomyopathy followed by Dr. Rollene Fare. 7. History of bilateral axillary fullness. 8. Diabetes. 9. Status post Port-A-Cath removal. 10. Hospitalization with pneumonia October 2012. 11. Question small bilateral inguinal lymph nodes on exam 04/17/2013-? Small left inguinal node on exam 07/17/2013. 12. CT 04/05/2015 with a 2.7 cm perirectal nodule; CT 07/31/2015 with mild increase in size of asymmetric right perirectal soft tissue density.  CT 05/05/2016 with no change in the perirectal soft tissue density, no evidence of metastatic disease 13. Placement of right arm fistula 09/16/2015. 14. Admission 05/10/2016 with subxiphoid discomfort-potentially related to gallbladder disease 15. Possible cirrhosis noted on the abdomen CT 05/10/2016 16. Mild thrombocytopenia-potentially related to cirrhosis 17. Question right lower extremity cellulitis   Disposition: Mr. Battersby remains in clinical remission from rectal cancer. We will follow-up on the CEA from today. He is now close to 10 years out from the initial diagnosis. He would like to continue annual follow-up at the [facility].  He may have a right lower extremity cellulitis. We prescribed a 7 day course of doxycycline. He will follow up with his PCP if there is no improvement over the next 24-48 hours.  He will return for a follow-up visit and CEA in one year. He will contact the office in the interim with any problems.  Patient seen with Dr. Benay Spice. 25 minutes were spent face-to-face at today's visit with the majority of that time  involved in counseling/coordination of care.    Ned Card ANP/GNP-BC   01/11/2017  9:34 AM  This was a shared visit with Ned Card. Mr. Shellhorn was interviewed and examined. He remains in clinical remission from rectal cancer. He appears to have an early cellulitis of the right lower leg. We prescribed doxycycline. He will follow-up with Dr. Gerarda Fraction if the symptoms have not improved within the next few days.  Julieanne Manson, M.D.

## 2017-01-11 NOTE — Telephone Encounter (Signed)
Appointments scheduled per 2/6 LOS. Patient's given AVS report and calendars with future scheduled appointments.

## 2017-01-12 ENCOUNTER — Encounter: Payer: Self-pay | Admitting: Cardiology

## 2017-01-12 DIAGNOSIS — E871 Hypo-osmolality and hyponatremia: Secondary | ICD-10-CM | POA: Diagnosis not present

## 2017-01-12 DIAGNOSIS — N184 Chronic kidney disease, stage 4 (severe): Secondary | ICD-10-CM | POA: Diagnosis not present

## 2017-01-12 DIAGNOSIS — D638 Anemia in other chronic diseases classified elsewhere: Secondary | ICD-10-CM | POA: Diagnosis not present

## 2017-01-12 LAB — CEA (PARALLEL TESTING): CEA: 0.6 ng/mL

## 2017-01-13 ENCOUNTER — Telehealth: Payer: Self-pay

## 2017-01-13 NOTE — Telephone Encounter (Signed)
Called and left message for pt to call back regarding lab results. 

## 2017-01-13 NOTE — Telephone Encounter (Signed)
-----   Message from Ladell Pier, MD sent at 01/12/2017  8:05 PM EST ----- Please call patient, cea is normal

## 2017-01-14 NOTE — Telephone Encounter (Signed)
-----   Message from Ladell Pier, MD sent at 01/12/2017  8:05 PM EST ----- Please call patient, cea is normal

## 2017-01-18 ENCOUNTER — Telehealth: Payer: Self-pay | Admitting: *Deleted

## 2017-01-18 ENCOUNTER — Telehealth (HOSPITAL_COMMUNITY): Payer: Self-pay | Admitting: *Deleted

## 2017-01-18 DIAGNOSIS — Z125 Encounter for screening for malignant neoplasm of prostate: Secondary | ICD-10-CM | POA: Diagnosis not present

## 2017-01-18 DIAGNOSIS — E663 Overweight: Secondary | ICD-10-CM | POA: Diagnosis not present

## 2017-01-18 DIAGNOSIS — I2510 Atherosclerotic heart disease of native coronary artery without angina pectoris: Secondary | ICD-10-CM | POA: Diagnosis not present

## 2017-01-18 DIAGNOSIS — E119 Type 2 diabetes mellitus without complications: Secondary | ICD-10-CM | POA: Diagnosis not present

## 2017-01-18 DIAGNOSIS — I5033 Acute on chronic diastolic (congestive) heart failure: Secondary | ICD-10-CM | POA: Diagnosis not present

## 2017-01-18 DIAGNOSIS — K219 Gastro-esophageal reflux disease without esophagitis: Secondary | ICD-10-CM | POA: Diagnosis not present

## 2017-01-18 DIAGNOSIS — E1129 Type 2 diabetes mellitus with other diabetic kidney complication: Secondary | ICD-10-CM | POA: Diagnosis not present

## 2017-01-18 DIAGNOSIS — I4891 Unspecified atrial fibrillation: Secondary | ICD-10-CM | POA: Diagnosis not present

## 2017-01-18 DIAGNOSIS — N184 Chronic kidney disease, stage 4 (severe): Secondary | ICD-10-CM | POA: Diagnosis not present

## 2017-01-18 DIAGNOSIS — Z6826 Body mass index (BMI) 26.0-26.9, adult: Secondary | ICD-10-CM | POA: Diagnosis not present

## 2017-01-18 DIAGNOSIS — L03115 Cellulitis of right lower limb: Secondary | ICD-10-CM | POA: Diagnosis not present

## 2017-01-18 DIAGNOSIS — R201 Hypoesthesia of skin: Secondary | ICD-10-CM | POA: Diagnosis not present

## 2017-01-18 DIAGNOSIS — E1165 Type 2 diabetes mellitus with hyperglycemia: Secondary | ICD-10-CM | POA: Diagnosis not present

## 2017-01-18 DIAGNOSIS — Z79899 Other long term (current) drug therapy: Secondary | ICD-10-CM | POA: Diagnosis not present

## 2017-01-18 NOTE — Telephone Encounter (Signed)
Received form from [facility], pt needs to have stent removed and needs to hold Eliquis.  Per Dr Haroldine Laws, ok to hold Eliquis 2 days prior, resume evening post procedure.  Note faxed to them at [phone]

## 2017-01-18 NOTE — Telephone Encounter (Signed)
Call received from patient to obtain lab results from last week.  Patient notified per order of Dr. Benay Spice that cea is normal.  Patient has no questions at this time.

## 2017-01-21 ENCOUNTER — Other Ambulatory Visit: Payer: Self-pay | Admitting: Gastroenterology

## 2017-01-25 ENCOUNTER — Encounter (HOSPITAL_COMMUNITY): Payer: Self-pay | Admitting: *Deleted

## 2017-01-25 NOTE — Progress Notes (Signed)
Unable to reach pt by phone for pre-op call. Left detail pre-op instructions on pt's home #. Instructed pt to take 1/2 of his regular dose of Lantus in the AM. Instructed him to check his blood sugar in the AM when he gets up and every 2 hours until he leaves for the hospital. If blood sugar is 70 or below, treat with 1/2 cup of clear juice (apple) and recheck blood sugar 15 minutes after drinking juice. If blood sugar continues to be 70 or below, call the Endoscopy department and ask to speak to a nurse.  EKG - 06/11/16 - in EPIC Pt has an ICD

## 2017-01-26 ENCOUNTER — Ambulatory Visit (HOSPITAL_COMMUNITY): Payer: Medicare Other | Admitting: Anesthesiology

## 2017-01-26 ENCOUNTER — Other Ambulatory Visit (HOSPITAL_COMMUNITY): Payer: Self-pay | Admitting: Adult Health

## 2017-01-26 ENCOUNTER — Ambulatory Visit (HOSPITAL_COMMUNITY): Payer: Medicare Other

## 2017-01-26 ENCOUNTER — Ambulatory Visit (HOSPITAL_COMMUNITY)
Admission: RE | Admit: 2017-01-26 | Discharge: 2017-01-26 | Disposition: A | Discharge status: Home / Self Care | Payer: Medicare Other | Source: Ambulatory Visit | Attending: Gastroenterology | Admitting: Gastroenterology

## 2017-01-26 ENCOUNTER — Encounter (HOSPITAL_COMMUNITY)
Admission: RE | Disposition: A | Discharge status: Home / Self Care | Payer: Self-pay | Source: Ambulatory Visit | Attending: Gastroenterology

## 2017-01-26 DIAGNOSIS — E119 Type 2 diabetes mellitus without complications: Secondary | ICD-10-CM | POA: Diagnosis not present

## 2017-01-26 DIAGNOSIS — K219 Gastro-esophageal reflux disease without esophagitis: Secondary | ICD-10-CM | POA: Insufficient documentation

## 2017-01-26 DIAGNOSIS — Z4689 Encounter for fitting and adjustment of other specified devices: Secondary | ICD-10-CM

## 2017-01-26 DIAGNOSIS — I252 Old myocardial infarction: Secondary | ICD-10-CM | POA: Diagnosis not present

## 2017-01-26 DIAGNOSIS — E785 Hyperlipidemia, unspecified: Secondary | ICD-10-CM | POA: Diagnosis not present

## 2017-01-26 DIAGNOSIS — Z7901 Long term (current) use of anticoagulants: Secondary | ICD-10-CM | POA: Insufficient documentation

## 2017-01-26 DIAGNOSIS — I110 Hypertensive heart disease with heart failure: Secondary | ICD-10-CM | POA: Insufficient documentation

## 2017-01-26 DIAGNOSIS — I2510 Atherosclerotic heart disease of native coronary artery without angina pectoris: Secondary | ICD-10-CM | POA: Insufficient documentation

## 2017-01-26 DIAGNOSIS — G4733 Obstructive sleep apnea (adult) (pediatric): Secondary | ICD-10-CM | POA: Diagnosis not present

## 2017-01-26 DIAGNOSIS — I509 Heart failure, unspecified: Secondary | ICD-10-CM | POA: Insufficient documentation

## 2017-01-26 DIAGNOSIS — K8050 Calculus of bile duct without cholangitis or cholecystitis without obstruction: Secondary | ICD-10-CM | POA: Diagnosis not present

## 2017-01-26 DIAGNOSIS — Z9689 Presence of other specified functional implants: Secondary | ICD-10-CM | POA: Diagnosis not present

## 2017-01-26 DIAGNOSIS — Z794 Long term (current) use of insulin: Secondary | ICD-10-CM | POA: Diagnosis not present

## 2017-01-26 DIAGNOSIS — K830 Cholangitis: Secondary | ICD-10-CM | POA: Diagnosis not present

## 2017-01-26 DIAGNOSIS — I4891 Unspecified atrial fibrillation: Secondary | ICD-10-CM | POA: Insufficient documentation

## 2017-01-26 HISTORY — PX: ERCP: SHX5425

## 2017-01-26 HISTORY — PX: GASTROINTESTINAL STENT REMOVAL: SHX6384

## 2017-01-26 LAB — COMPREHENSIVE METABOLIC PANEL
ALT: 11 U/L — AB (ref 17–63)
ANION GAP: 12 (ref 5–15)
AST: 22 U/L (ref 15–41)
Albumin: 3.2 g/dL — ABNORMAL LOW (ref 3.5–5.0)
Alkaline Phosphatase: 72 U/L (ref 38–126)
BUN: 62 mg/dL — ABNORMAL HIGH (ref 6–20)
CO2: 25 mmol/L (ref 22–32)
CREATININE: 2.7 mg/dL — AB (ref 0.61–1.24)
Calcium: 9.4 mg/dL (ref 8.9–10.3)
Chloride: 98 mmol/L — ABNORMAL LOW (ref 101–111)
GFR, EST AFRICAN AMERICAN: 27 mL/min — AB (ref >60)
GFR, EST NON AFRICAN AMERICAN: 24 mL/min — AB (ref >60)
Glucose, Bld: 228 mg/dL — ABNORMAL HIGH (ref 65–99)
Potassium: 3.1 mmol/L — ABNORMAL LOW (ref 3.5–5.1)
SODIUM: 135 mmol/L (ref 135–145)
Total Bilirubin: 1.4 mg/dL — ABNORMAL HIGH (ref 0.3–1.2)
Total Protein: 7 g/dL (ref 6.5–8.1)

## 2017-01-26 LAB — GLUCOSE, CAPILLARY: GLUCOSE-CAPILLARY: 217 mg/dL — AB (ref 65–99)

## 2017-01-26 LAB — POCT I-STAT, CHEM 8
BUN: 58 mg/dL — ABNORMAL HIGH (ref 6–20)
CALCIUM ION: 1.09 mmol/L — AB (ref 1.15–1.40)
CREATININE: 2.6 mg/dL — AB (ref 0.61–1.24)
Chloride: 95 mmol/L — ABNORMAL LOW (ref 101–111)
GLUCOSE: 232 mg/dL — AB (ref 65–99)
HCT: 36 % — ABNORMAL LOW (ref 39.0–52.0)
HEMOGLOBIN: 12.2 g/dL — AB (ref 13.0–17.0)
Potassium: 3.2 mmol/L — ABNORMAL LOW (ref 3.5–5.1)
Sodium: 137 mmol/L (ref 135–145)
TCO2: 27 mmol/L (ref 0–100)

## 2017-01-26 SURGERY — ERCP, WITH INTERVENTION IF INDICATED
Anesthesia: General

## 2017-01-26 MED ORDER — ETOMIDATE 2 MG/ML IV SOLN
INTRAVENOUS | Status: DC | PRN
  Administered 2017-01-26: 8 mg via INTRAVENOUS

## 2017-01-26 MED ORDER — ROCURONIUM BROMIDE 100 MG/10ML IV SOLN
INTRAVENOUS | Status: DC | PRN
  Administered 2017-01-26: 50 mg via INTRAVENOUS

## 2017-01-26 MED ORDER — FENTANYL CITRATE (PF) 100 MCG/2ML IJ SOLN
INTRAMUSCULAR | Status: DC | PRN
  Administered 2017-01-26: 25 ug via INTRAVENOUS

## 2017-01-26 MED ORDER — SODIUM CHLORIDE 0.9 % IV SOLN
INTRAVENOUS | Status: DC
  Administered 2017-01-26 (×2): via INTRAVENOUS

## 2017-01-26 MED ORDER — ONDANSETRON HCL 4 MG/2ML IJ SOLN
INTRAMUSCULAR | Status: DC | PRN
  Administered 2017-01-26: 4 mg via INTRAVENOUS

## 2017-01-26 MED ORDER — PROMETHAZINE HCL 25 MG/ML IJ SOLN
INTRAMUSCULAR | Status: AC
  Filled 2017-01-26: qty 1

## 2017-01-26 MED ORDER — SODIUM CHLORIDE 0.9 % IV SOLN
INTRAVENOUS | Status: DC | PRN
  Administered 2017-01-26: 40 mL

## 2017-01-26 MED ORDER — CIPROFLOXACIN IN D5W 400 MG/200ML IV SOLN
INTRAVENOUS | Status: AC
  Filled 2017-01-26: qty 200

## 2017-01-26 MED ORDER — ONDANSETRON HCL 4 MG/2ML IJ SOLN
INTRAMUSCULAR | Status: AC
Start: 2017-01-26 — End: 2017-01-26
  Filled 2017-01-26: qty 2

## 2017-01-26 MED ORDER — GLUCAGON HCL RDNA (DIAGNOSTIC) 1 MG IJ SOLR
INTRAMUSCULAR | Status: AC
  Filled 2017-01-26: qty 1

## 2017-01-26 MED ORDER — IOPAMIDOL (ISOVUE-300) INJECTION 61%
INTRAVENOUS | Status: AC
  Filled 2017-01-26: qty 50

## 2017-01-26 MED ORDER — KETAMINE HCL 10 MG/ML IJ SOLN
INTRAMUSCULAR | Status: DC | PRN
  Administered 2017-01-26: 30 mg via INTRAVENOUS

## 2017-01-26 MED ORDER — CIPROFLOXACIN IN D5W 400 MG/200ML IV SOLN
400.0000 mg | Freq: Once | INTRAVENOUS | Status: AC
  Administered 2017-01-26: 400 mg via INTRAVENOUS

## 2017-01-26 MED ORDER — PROMETHAZINE HCL 25 MG/ML IJ SOLN
6.2500 mg | Freq: Once | INTRAMUSCULAR | Status: AC
  Administered 2017-01-26: 6.25 mg via INTRAVENOUS

## 2017-01-26 MED ORDER — HYDROMORPHONE HCL 1 MG/ML IJ SOLN: 0.2500 mg | INTRAMUSCULAR | Status: DC | PRN

## 2017-01-26 MED ORDER — MIDAZOLAM HCL 2 MG/2ML IJ SOLN
INTRAMUSCULAR | Status: DC | PRN
  Administered 2017-01-26: 2 mg via INTRAVENOUS

## 2017-01-26 MED ORDER — SUGAMMADEX SODIUM 200 MG/2ML IV SOLN
INTRAVENOUS | Status: DC | PRN
  Administered 2017-01-26: 160 mg via INTRAVENOUS

## 2017-01-26 NOTE — Transfer of Care (Signed)
Immediate Anesthesia Transfer of Care Note  Patient: [name]  Procedure(s) Performed: Procedure(s): ENDOSCOPIC RETROGRADE CHOLANGIOPANCREATOGRAPHY (ERCP) (N/A) GASTROINTESTINAL STENT REMOVAL (N/A)  Patient Location: Endoscopy Unit  Anesthesia Type:General  Level of Consciousness: awake, alert  and oriented  Airway & Oxygen Therapy: Patient Spontanous Breathing and Patient connected to nasal cannula oxygen  Post-op Assessment: Report given to RN, Post -op Vital signs reviewed and stable and Patient moving all extremities  Post vital signs: Reviewed and stable  Last Vitals:  Vitals:   01/26/17 0922 01/26/17 1314  BP: 107/62 111/62  Pulse: 70 70  Resp: 15 13  Temp: 36.3 C (!) 36 C    Last Pain:  Vitals:   01/26/17 1314  TempSrc: Axillary         Complications: No apparent anesthesia complications

## 2017-01-26 NOTE — Anesthesia Preprocedure Evaluation (Signed)
Anesthesia Evaluation  Patient identified by MRN, date of birth, ID band Patient awake    Reviewed: Allergy & Precautions, NPO status , Patient's Chart, lab work & pertinent test results  Airway Mallampati: II  TM Distance: >3 FB Neck ROM: Full    Dental no notable dental hx. (+) Teeth Intact, Dental Advisory Given   Pulmonary sleep apnea    Pulmonary exam normal breath sounds clear to auscultation       Cardiovascular hypertension, + dysrhythmias Atrial Fibrillation + pacemaker  Rhythm:Regular Rate:Normal     Neuro/Psych negative neurological ROS  negative psych ROS   GI/Hepatic   Endo/Other  diabetes    Renal/GU      Musculoskeletal  (+) Arthritis ,    Abdominal   Peds  Hematology   Anesthesia Other Findings All: Metoprolol, amlodipine sulfa, clindamycin, lipitor  Reproductive/Obstetrics                             Anesthesia Physical Anesthesia Plan  ASA: 3  Anesthesia Plan: General   Post-op Pain Management: Precedex and Tylenol PO (pre-op)*   Induction: Intravenous  PONV Risk Score and Plan: 4 or greater and Treatment may vary due to age or medical condition, Ondansetron and Midazolam  Airway Management Planned: LMA and Oral ETT  Additional Equipment: None  Intra-op Plan:   Post-operative Plan: Extubation in OR  Informed Consent: I have reviewed the patients History and Physical, chart, labs and discussed the procedure including the risks, benefits and alternatives for the proposed anesthesia with the patient or authorized representative who has indicated his/her understanding and acceptance.     Dental advisory given and Interpreter used for interveiw  Plan Discussed with:   Anesthesia Plan Comments:        Anesthesia Quick Evaluation  

## 2017-01-26 NOTE — Anesthesia Postprocedure Evaluation (Signed)
Anesthesia Post Note  Patient: [name]  Procedure(s) Performed: Procedure(s) (LRB): ENDOSCOPIC RETROGRADE CHOLANGIOPANCREATOGRAPHY (ERCP) (N/A) GASTROINTESTINAL STENT REMOVAL (N/A)  Patient location during evaluation: PACU Anesthesia Type: General Level of consciousness: awake and alert Pain management: pain level controlled Vital Signs Assessment: post-procedure vital signs reviewed and stable Respiratory status: spontaneous breathing, nonlabored ventilation, respiratory function stable and patient connected to nasal cannula oxygen Cardiovascular status: blood pressure returned to baseline and stable Postop Assessment: no signs of nausea or vomiting Anesthetic complications: no       Last Vitals:  Vitals:   01/26/17 1440 01/26/17 1450  BP: (!) 106/59 (!) 103/53  Pulse: 70   Resp: 12   Temp:      Last Pain:  Vitals:   01/26/17 1314  TempSrc: Axillary                 Evanthia Maund,W. EDMOND

## 2017-01-26 NOTE — Discharge Instructions (Signed)
YOU HAD AN ENDOSCOPIC PROCEDURE TODAY: Refer to the procedure report and other information in the discharge instructions given to you for any specific questions about what was found during the examination. If this information does not answer your questions, please call [place] office at [phone] to clarify.  ° °YOU SHOULD EXPECT: Some feelings of bloating in the abdomen. Passage of more gas than usual. Walking can help get rid of the air that was put into your GI tract during the procedure and reduce the bloating.. ° °DIET: Your first meal following the procedure should be a light meal and then it is ok to progress to your normal diet. A half-sandwich or bowl of soup is an example of a good first meal. Heavy or fried foods are harder to digest and may make you feel nauseous or bloated. Drink plenty of fluids but you should avoid alcoholic beverages for 24 hours. If you had a esophageal dilation, please see attached instructions for diet.   ° °ACTIVITY: Your care partner should take you home directly after the procedure. You should plan to take it easy, moving slowly for the rest of the day. You can resume normal activity the day after the procedure however YOU SHOULD NOT DRIVE, use power tools, machinery or perform tasks that involve climbing or major physical exertion for 24 hours (because of the sedation medicines used during the test).  ° °SYMPTOMS TO REPORT IMMEDIATELY: °A gastroenterologist can be reached at any hour. Please call [phone]  for any of the following symptoms:  ° °Following upper endoscopy (EGD, EUS, ERCP, esophageal dilation) °Vomiting of blood or coffee ground material  °New, significant abdominal pain  °New, significant chest pain or pain under the shoulder blades  °Painful or persistently difficult swallowing  °New shortness of breath  °Black, tarry-looking or red, bloody stools ° °FOLLOW UP:  °If any biopsies were taken you will be contacted by phone or by letter within the next 1-3  weeks. Call [phone]  if you have not heard about the biopsies in 3 weeks.  °Please also call with any specific questions about appointments or follow up tests. ° °

## 2017-01-26 NOTE — Progress Notes (Signed)
Pt up to side of bed dressed and ready to discharge.  Vomited approx 50 cc bile like liquid.  Per pt and wife would prefer to go home and let him sleep.  States "he does this everytime".  Spoke with Dr. Ola Spurr, in agreement with sending pt home.  Enc to keep hydrated.  If pt continues to have nausea and vomiting to contact MD.

## 2017-01-26 NOTE — Anesthesia Preprocedure Evaluation (Addendum)
Anesthesia Evaluation  Patient identified by MRN, date of birth, ID band Patient awake    Reviewed: Allergy & Precautions, H&P , NPO status , Patient's Chart, lab work & pertinent test results  History of Anesthesia Complications (+) PONV  Airway Mallampati: II  TM Distance: >3 FB Neck ROM: Full    Dental no notable dental hx. (+) Teeth Intact, Dental Advisory Given   Pulmonary sleep apnea ,    Pulmonary exam normal breath sounds clear to auscultation       Cardiovascular hypertension, Pt. on medications and Pt. on home beta blockers + CAD, + Past MI, + Cardiac Stents and +CHF  + dysrhythmias Atrial Fibrillation  Rhythm:Regular Rate:Normal     Neuro/Psych negative neurological ROS  negative psych ROS   GI/Hepatic Neg liver ROS, GERD  Medicated and Controlled,  Endo/Other  diabetes  Renal/GU Renal InsufficiencyRenal disease  negative genitourinary   Musculoskeletal   Abdominal   Peds  Hematology negative hematology ROS (+) anemia ,   Anesthesia Other Findings   Reproductive/Obstetrics negative OB ROS                            Anesthesia Physical Anesthesia Plan  ASA: IV  Anesthesia Plan: General   Post-op Pain Management:    Induction: Intravenous  Airway Management Planned: Oral ETT  Additional Equipment:   Intra-op Plan:   Post-operative Plan: Extubation in OR  Informed Consent: I have reviewed the patients History and Physical, chart, labs and discussed the procedure including the risks, benefits and alternatives for the proposed anesthesia with the patient or authorized representative who has indicated his/her understanding and acceptance.   Dental advisory given  Plan Discussed with: CRNA  Anesthesia Plan Comments:         Anesthesia Quick Evaluation

## 2017-01-26 NOTE — Anesthesia Procedure Notes (Signed)
Procedure Name: Intubation Date/Time: 01/26/2017 12:03 PM Performed by: Trixie Deis A Pre-anesthesia Checklist: Patient identified, Emergency Drugs available, Suction available and Patient being monitored Patient Re-evaluated:Patient Re-evaluated prior to inductionOxygen Delivery Method: Circle System Utilized Preoxygenation: Pre-oxygenation with 100% oxygen Intubation Type: IV induction Ventilation: Mask ventilation without difficulty Laryngoscope Size: Mac and 4 Grade View: Grade II Tube type: Oral Tube size: 7.5 mm Number of attempts: 1 Airway Equipment and Method: Stylet and Oral airway Placement Confirmation: ETT inserted through vocal cords under direct vision,  positive ETCO2 and breath sounds checked- equal and bilateral Secured at: 23 cm Tube secured with: Tape Dental Injury: Teeth and Oropharynx as per pre-operative assessment

## 2017-01-26 NOTE — Op Note (Signed)
[facility] Patient Name: [name] Procedure Date : 01/26/2017 MRN: [id number] Attending MD: Barrie Folk , MD Date of Birth: Sep 06, 1956 CSN: [id number] Age: 61 Admit Type: Inpatient Procedure:                ERCP Indications:              Follow-up of bile duct stone(s), Biliary stent                            removal Providers:                Jens Siems C. Madilyn Fireman, MD, Dwain Sarna, RN, Arlee Muslim Tech., Technician, Orvilla Fus, CRNA Referring MD:              Medicines:                General Anesthesia Complications:            No immediate complications. Estimated Blood Loss:     Estimated blood loss: none. Procedure:                Pre-Anesthesia Assessment:                           - Prior to the procedure, a History and Physical                            was performed, and patient medications and                            allergies were reviewed. The patient's tolerance of                            previous anesthesia was also reviewed. The risks                            and benefits of the procedure and the sedation                            options and risks were discussed with the patient.                            All questions were answered, and informed consent                            was obtained. Prior Anticoagulants: The patient has                            taken no previous anticoagulant or antiplatelet                            agents. ASA Grade Assessment: II - A patient with  mild systemic disease. After reviewing the risks                            and benefits, the patient was deemed in                            satisfactory condition to undergo the procedure.                           After obtaining informed consent, the scope was                            passed under direct vision. Throughout the                            procedure, the patient's blood pressure, pulse,  and                            oxygen saturations were monitored continuously. The                            [id number] ([id number]) scope was introduced through                            the mouth, and used to inject contrast into and                            used to inject contrast into the ventral pancreatic                            duct. The ERCP was accomplished without difficulty.                            The patient tolerated the procedure well. Scope In: Scope Out: Findings:      The major papilla was normal. The bile duct was deeply cannulated.       Contrast was injected. I personally interpreted the bile duct images.       Ductal flow of contrast was adequate. Image quality was adequate.       Contrast extended to the entire biliary tree. The lower third of the       main bile duct contained three stones, the largest of which was 6 mm in       diameter. The biliary sphincterotomy was extended to a total of 3 mm in       length with a traction (standard) sphincterotome. The sphincterotomy       oozed blood. To discover objects, the biliary tree was swept with a 15       mm balloon starting at the bifurcation. Three stones were removed. No       stones remained. Impression:               - The major papilla appeared normal.                           - Choledocholithiasis was found. Complete removal  was accomplished by biliary sphincterotomy and                            balloon extraction.                           - A biliary sphincterotomy was performed.                           - The biliary tree was swept. Recommendation:           - Resume previous diet.                           - Return to my office PRN. Procedure Code(s):        --- Professional ---                           [phone], Endoscopic retrograde                            cholangiopancreatography (ERCP); with removal of                            calculi/debris from  biliary/pancreatic duct(s)                           43262, Endoscopic retrograde                            cholangiopancreatography (ERCP); with                            sphincterotomy/papillotomy Diagnosis Code(s):        --- Professional ---                           K80.50, Calculus of bile duct without cholangitis                            or cholecystitis without obstruction                           Z46.59, Encounter for fitting and adjustment of                            other gastrointestinal appliance and device CPT copyright 2016 American Medical Association. All rights reserved. The codes documented in this report are preliminary and upon coder review may  be revised to meet current compliance requirements. Barrie Folk, MD 01/26/2017 1:00:32 PM This report has been signed electronically. Number of Addenda: 0

## 2017-01-26 NOTE — Interval H&P Note (Signed)
History and Physical Interval Note:  01/26/2017 10:56 AM  [name]  has presented today for surgery, with the diagnosis of cholangitis  The various methods of treatment have been discussed with the patient and family. After consideration of risks, benefits and other options for treatment, the patient has consented to  Procedure(s): ENDOSCOPIC RETROGRADE CHOLANGIOPANCREATOGRAPHY (ERCP) (N/A) GASTROINTESTINAL STENT REMOVAL (N/A) as a surgical intervention .  The patient's history has been reviewed, patient examined, no change in status, stable for surgery.  I have reviewed the patient's chart and labs.  Questions were answered to the patient's satisfaction.     [name]

## 2017-01-26 NOTE — H&P (View-Only) (Signed)
Unable to reach pt by phone for pre-op call. Left detail pre-op instructions on pt's home #. Instructed pt to take 1/2 of his regular dose of Lantus in the AM. Instructed him to check his blood sugar in the AM when he gets up and every 2 hours until he leaves for the hospital. If blood sugar is 70 or below, treat with 1/2 cup of clear juice (apple) and recheck blood sugar 15 minutes after drinking juice. If blood sugar continues to be 70 or below, call the Endoscopy department and ask to speak to a nurse.  EKG - 06/11/16 - in EPIC Pt has an ICD

## 2017-01-27 ENCOUNTER — Encounter (HOSPITAL_COMMUNITY): Payer: Self-pay | Admitting: Gastroenterology

## 2017-02-02 ENCOUNTER — Other Ambulatory Visit: Payer: Self-pay | Admitting: Cardiology

## 2017-02-04 IMAGING — DX DG CHEST 2V
2 series · 2 of 2 positions shown · non-contrast
Comparison: August 02, 2015.

CLINICAL DATA: Dyspnea, productive cough.

EXAM:
CHEST  2 VIEW

[chest pa]
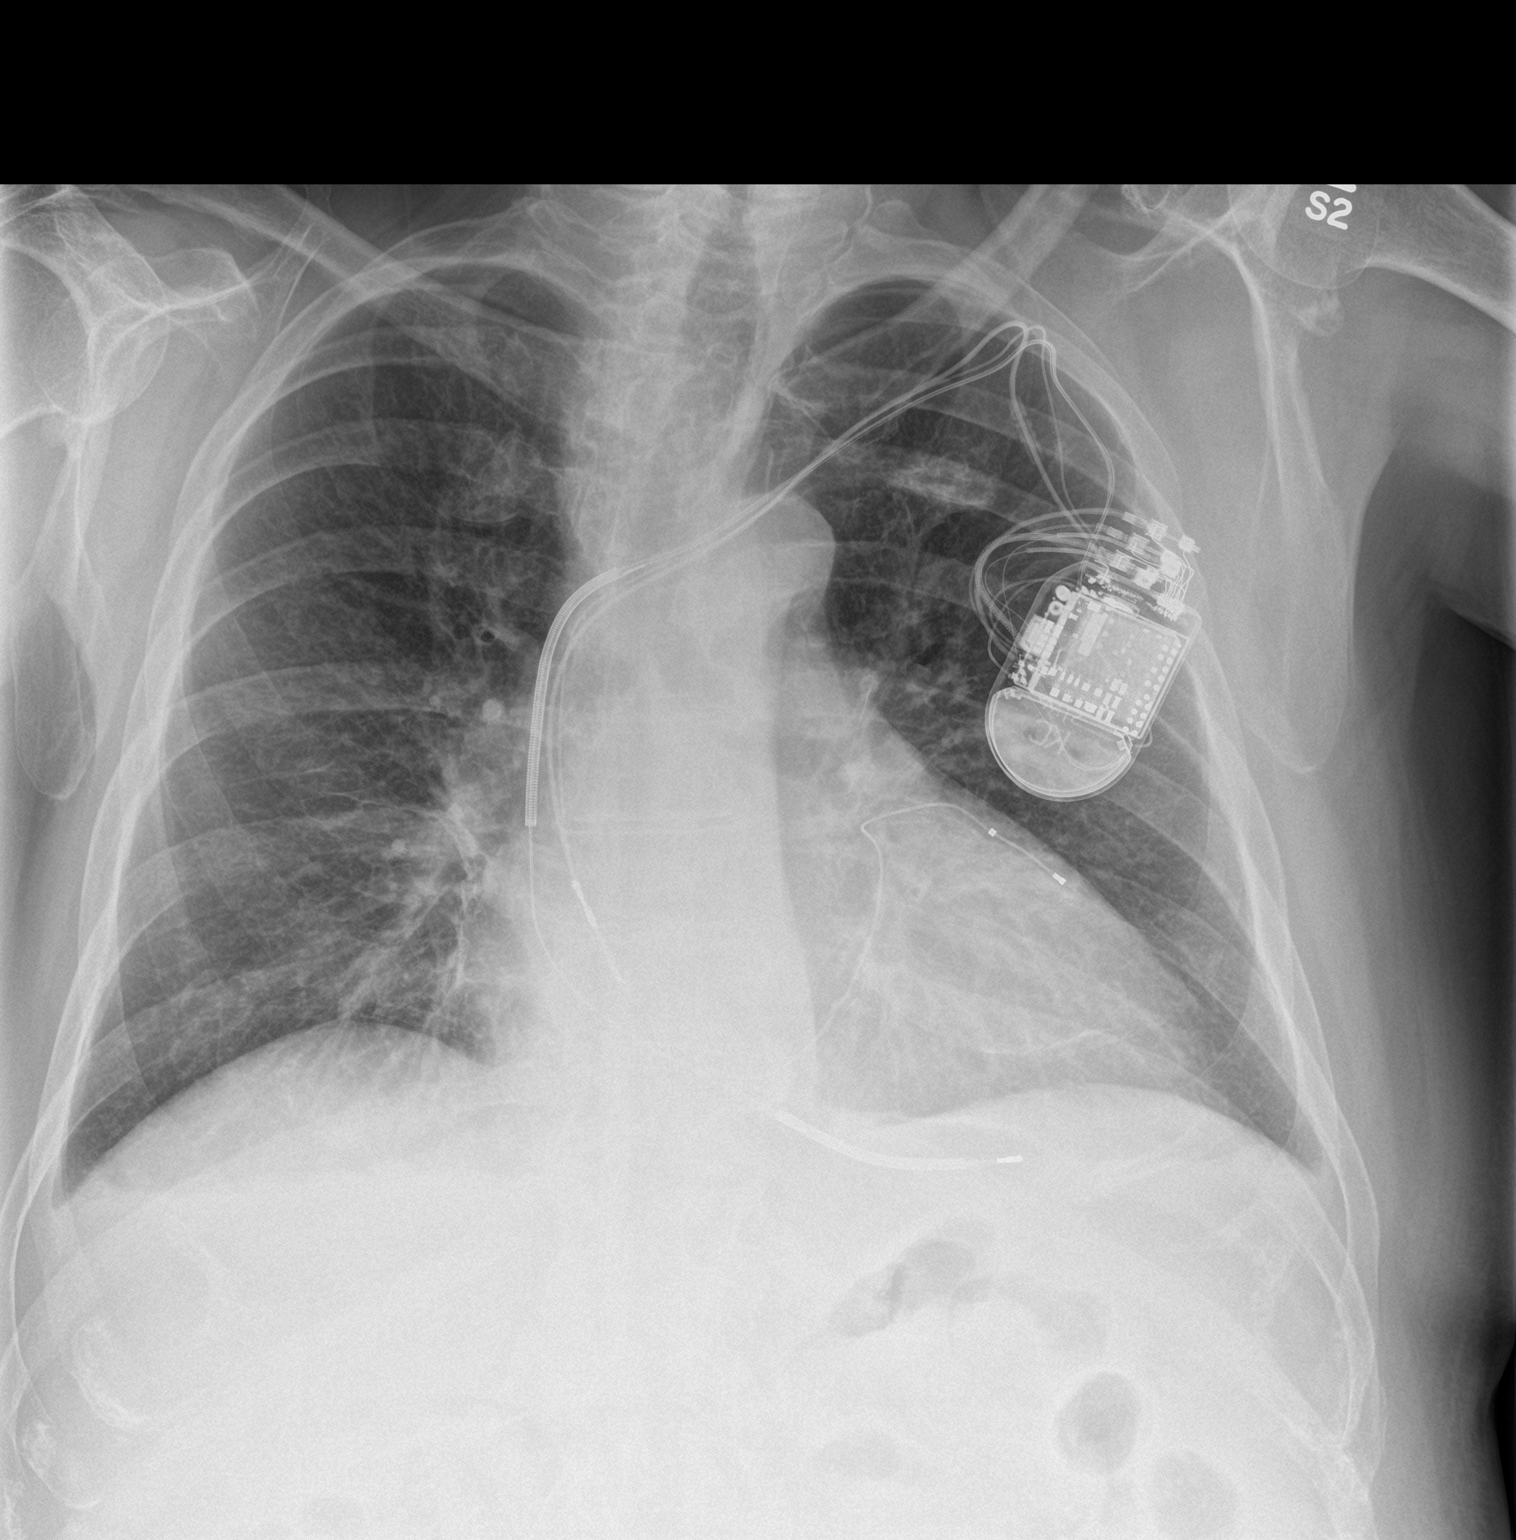

[chest lat]
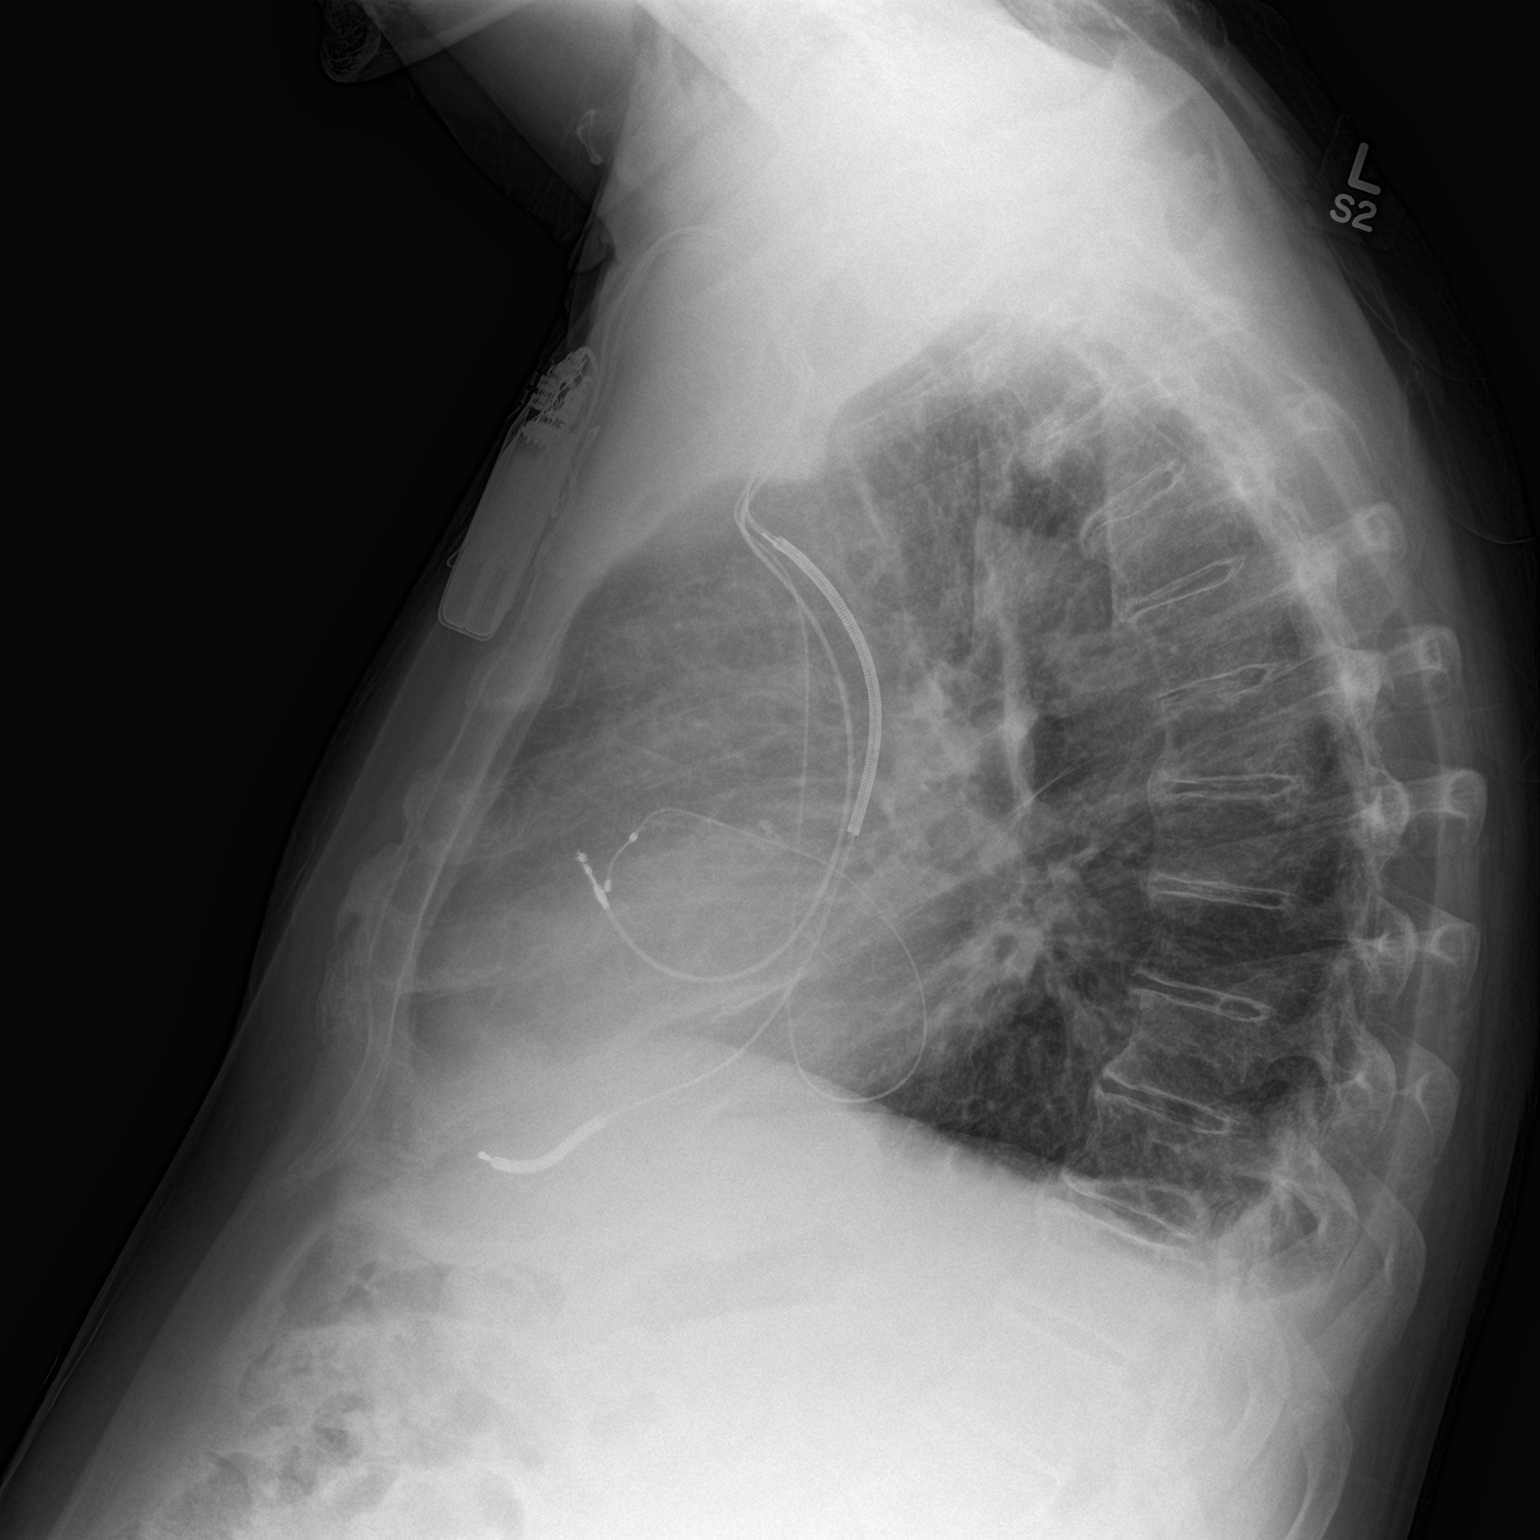

[2 of 2 positions shown; findings below may reference images not displayed]

FINDINGS: The heart size and mediastinal contours are within normal limits.
Both lungs are clear. No pneumothorax is noted. Minimal bilateral
pleural effusions are noted. Left-sided pacemaker is unchanged in
position. The visualized skeletal structures are unremarkable.
IMPRESSION: Minimal bilateral pleural effusions. No other significant
abnormality seen in the chest.

## 2017-02-08 IMAGING — DX DG CHEST 2V
2 series · 2 of 2 positions shown · non-contrast
Comparison: 09/26/2015

CLINICAL DATA: Productive cough

EXAM:
CHEST - 2 VIEW

[chest pa]
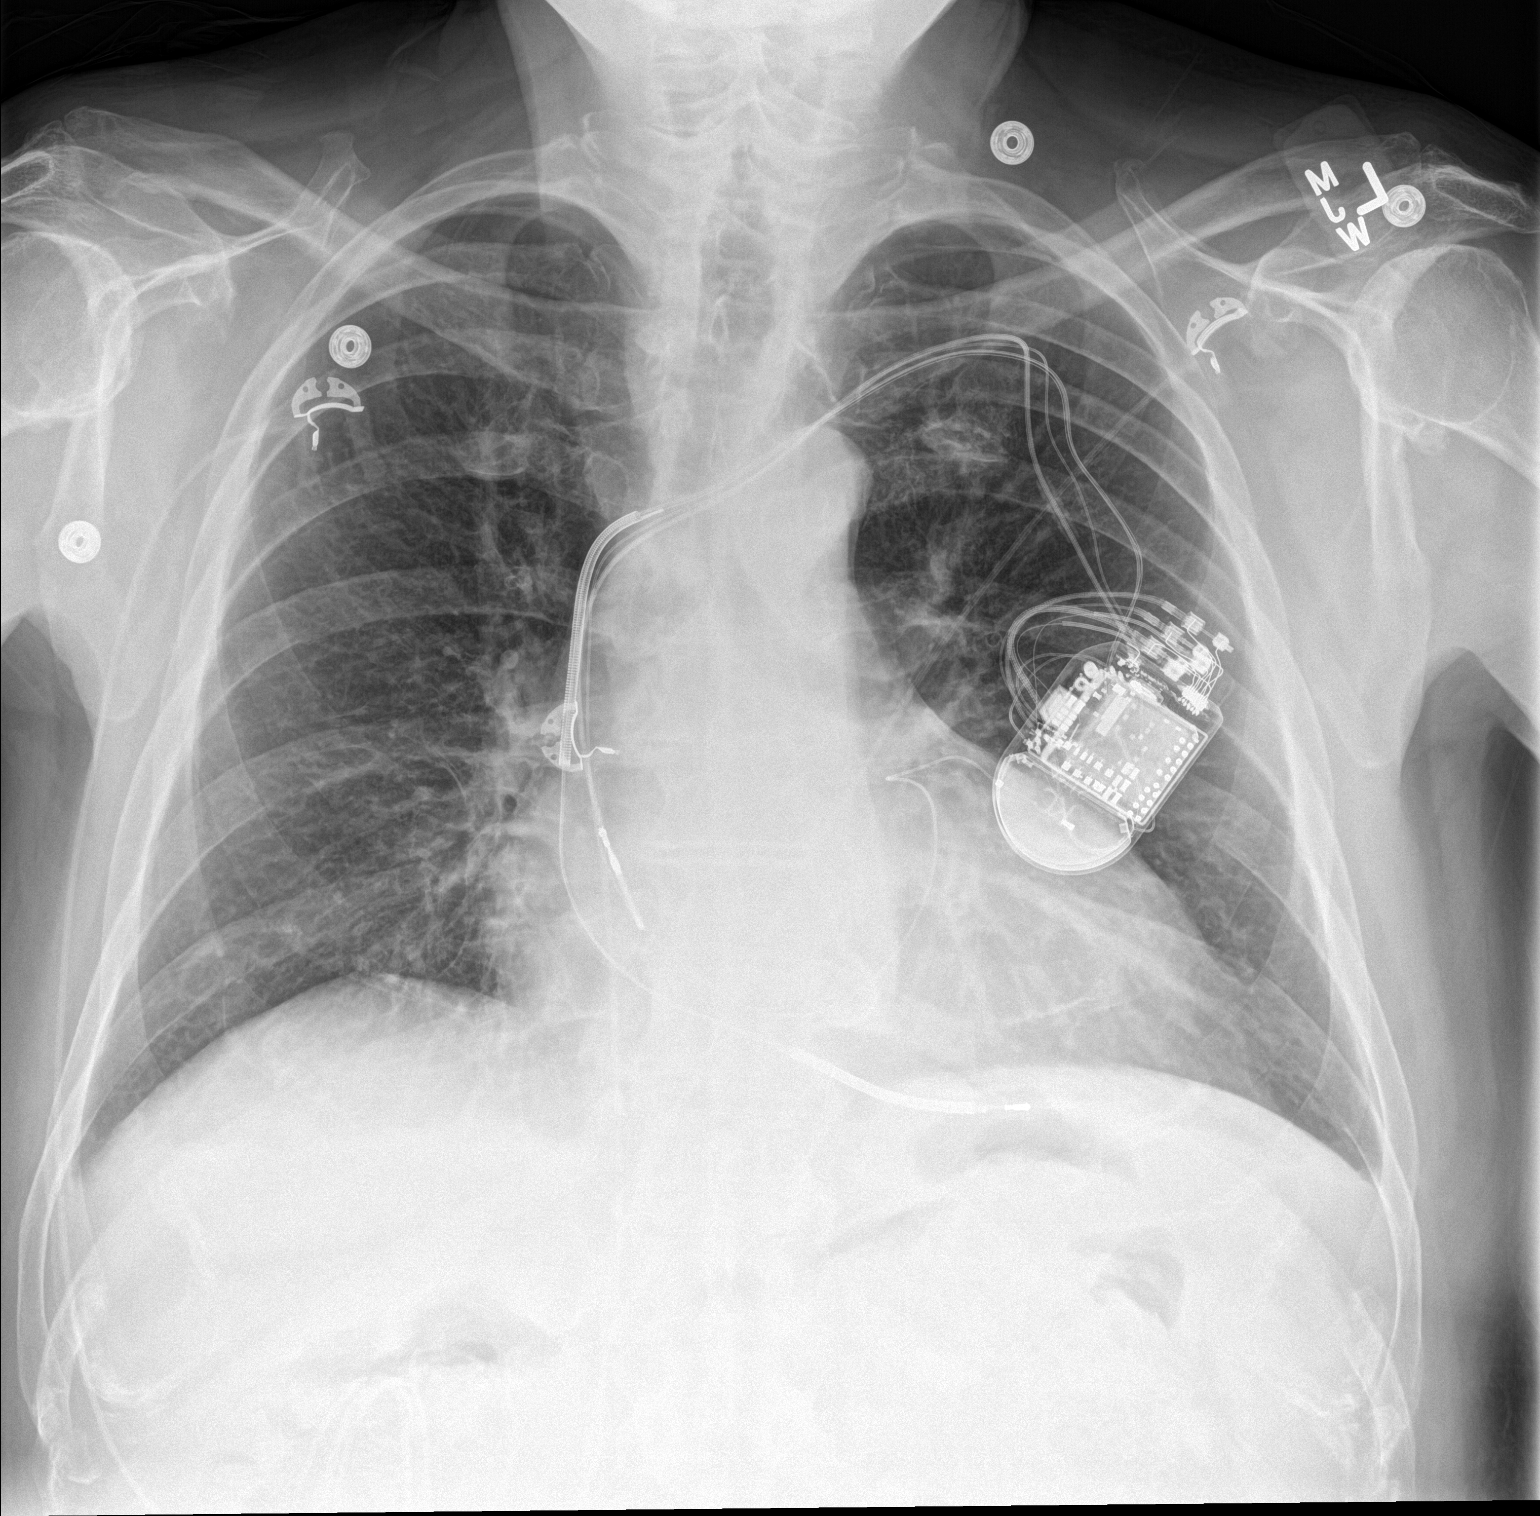

[chest lat]
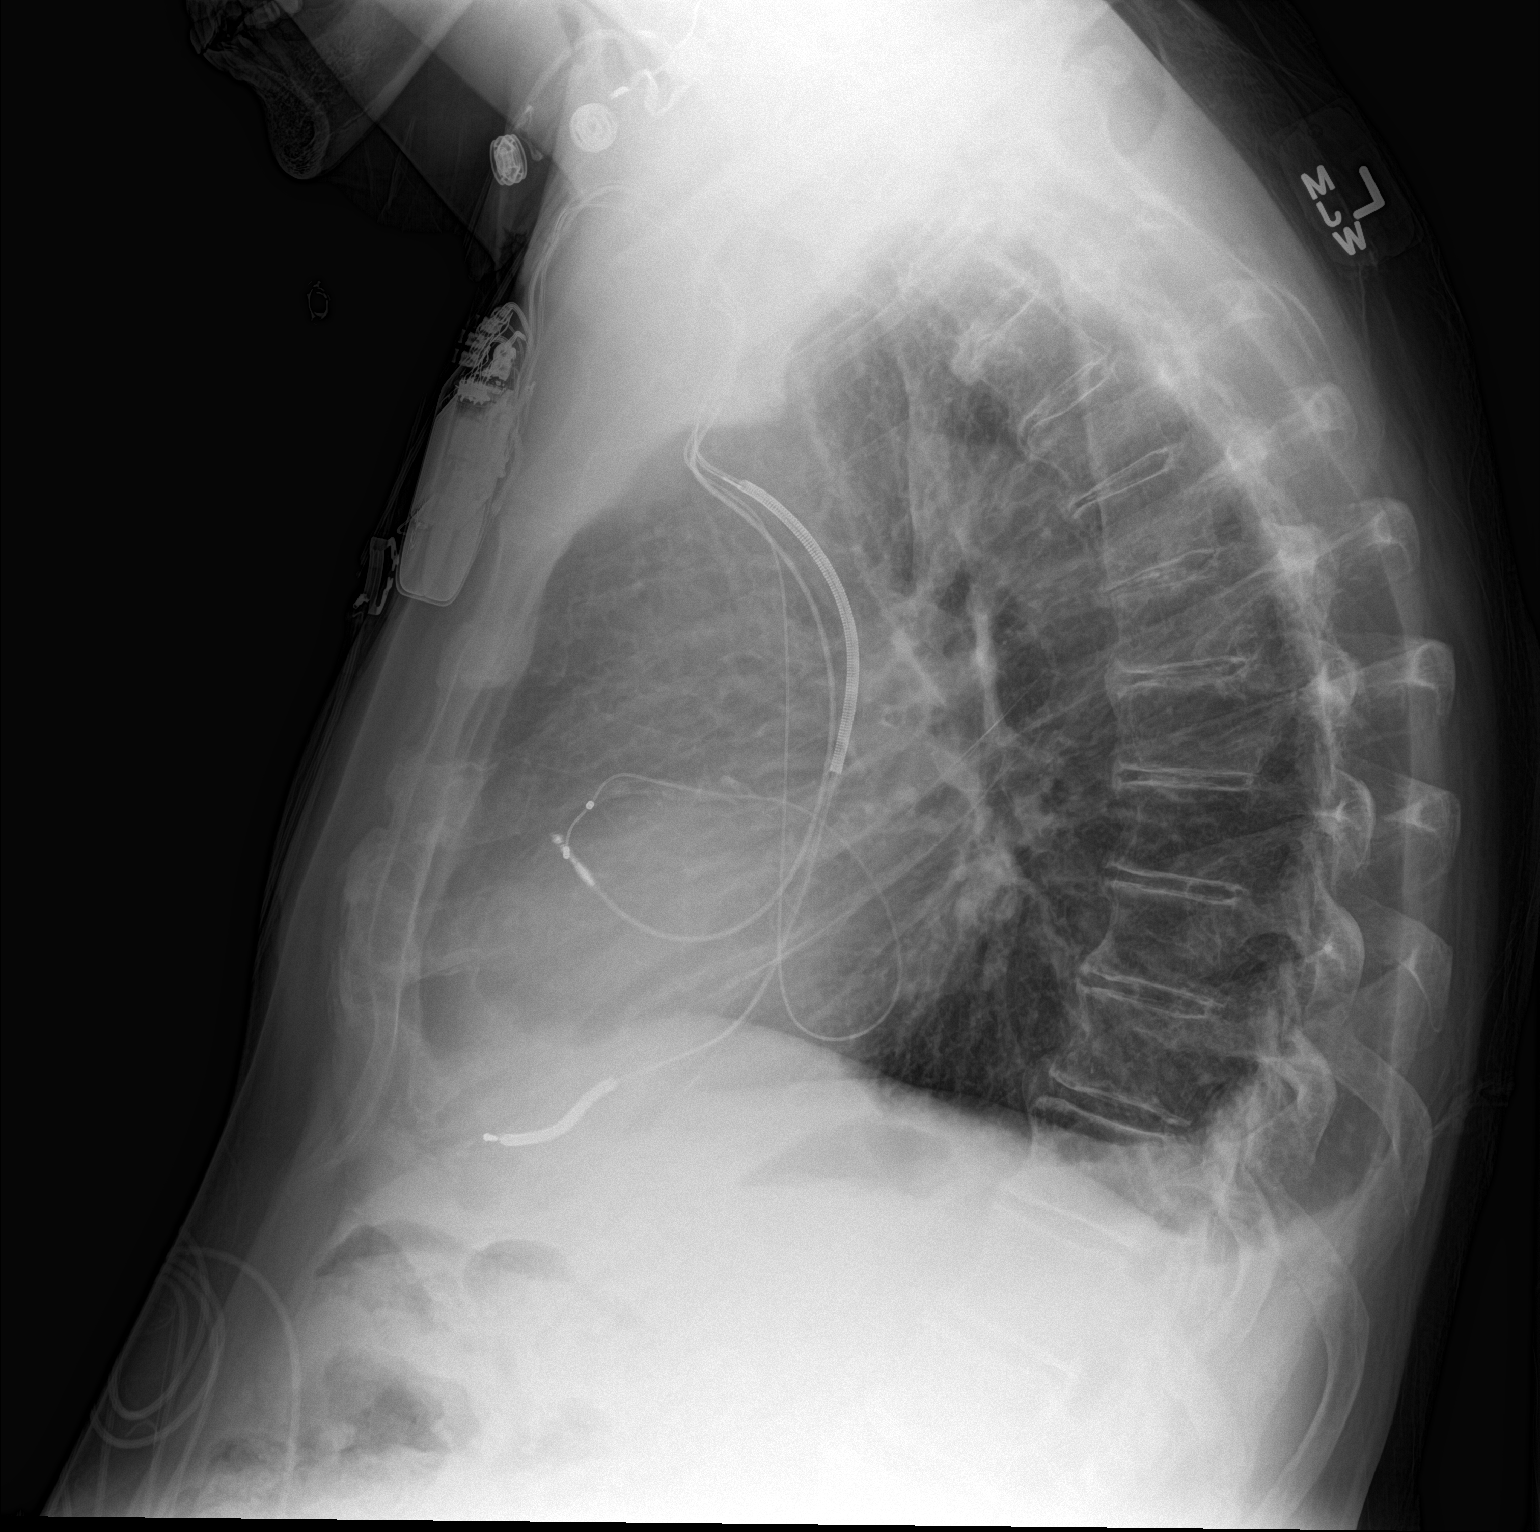

[2 of 2 positions shown; findings below may reference images not displayed]

FINDINGS: Cardiac shadow remains enlarged but stable. A defibrillator is again
seen and stable. The lungs are well aerated bilaterally. Small
pleural effusions are again noted. Degenerative changes of the
thoracic spine are again seen.
IMPRESSION: No significant interval change from the prior exam.

## 2017-02-15 ENCOUNTER — Encounter (HOSPITAL_COMMUNITY): Payer: Self-pay | Admitting: Internal Medicine

## 2017-02-15 ENCOUNTER — Ambulatory Visit (HOSPITAL_COMMUNITY)
Admission: RE | Admit: 2017-02-15 | Discharge: 2017-02-15 | Disposition: A | Discharge status: Home / Self Care | Payer: Medicare Other | Source: Ambulatory Visit | Attending: Internal Medicine | Admitting: Internal Medicine

## 2017-02-15 VITALS — BP 104/60 | HR 72 | Wt 187.5 lb

## 2017-02-15 DIAGNOSIS — K219 Gastro-esophageal reflux disease without esophagitis: Secondary | ICD-10-CM | POA: Insufficient documentation

## 2017-02-15 DIAGNOSIS — Z7901 Long term (current) use of anticoagulants: Secondary | ICD-10-CM | POA: Insufficient documentation

## 2017-02-15 DIAGNOSIS — K830 Cholangitis: Secondary | ICD-10-CM | POA: Diagnosis not present

## 2017-02-15 DIAGNOSIS — I482 Chronic atrial fibrillation: Secondary | ICD-10-CM | POA: Insufficient documentation

## 2017-02-15 DIAGNOSIS — E785 Hyperlipidemia, unspecified: Secondary | ICD-10-CM | POA: Insufficient documentation

## 2017-02-15 DIAGNOSIS — I2510 Atherosclerotic heart disease of native coronary artery without angina pectoris: Secondary | ICD-10-CM

## 2017-02-15 DIAGNOSIS — N184 Chronic kidney disease, stage 4 (severe): Secondary | ICD-10-CM | POA: Diagnosis not present

## 2017-02-15 DIAGNOSIS — Z8673 Personal history of transient ischemic attack (TIA), and cerebral infarction without residual deficits: Secondary | ICD-10-CM | POA: Insufficient documentation

## 2017-02-15 DIAGNOSIS — E1165 Type 2 diabetes mellitus with hyperglycemia: Secondary | ICD-10-CM | POA: Diagnosis not present

## 2017-02-15 DIAGNOSIS — I5022 Chronic systolic (congestive) heart failure: Secondary | ICD-10-CM | POA: Insufficient documentation

## 2017-02-15 DIAGNOSIS — I130 Hypertensive heart and chronic kidney disease with heart failure and stage 1 through stage 4 chronic kidney disease, or unspecified chronic kidney disease: Secondary | ICD-10-CM | POA: Diagnosis not present

## 2017-02-15 DIAGNOSIS — Z9889 Other specified postprocedural states: Secondary | ICD-10-CM | POA: Diagnosis not present

## 2017-02-15 DIAGNOSIS — I252 Old myocardial infarction: Secondary | ICD-10-CM | POA: Insufficient documentation

## 2017-02-15 DIAGNOSIS — Z85048 Personal history of other malignant neoplasm of rectum, rectosigmoid junction, and anus: Secondary | ICD-10-CM | POA: Insufficient documentation

## 2017-02-15 DIAGNOSIS — I255 Ischemic cardiomyopathy: Secondary | ICD-10-CM | POA: Diagnosis not present

## 2017-02-15 DIAGNOSIS — IMO0002 Reserved for concepts with insufficient information to code with codable children: Secondary | ICD-10-CM

## 2017-02-15 DIAGNOSIS — Z955 Presence of coronary angioplasty implant and graft: Secondary | ICD-10-CM | POA: Insufficient documentation

## 2017-02-15 DIAGNOSIS — Z8249 Family history of ischemic heart disease and other diseases of the circulatory system: Secondary | ICD-10-CM | POA: Insufficient documentation

## 2017-02-15 DIAGNOSIS — E1122 Type 2 diabetes mellitus with diabetic chronic kidney disease: Secondary | ICD-10-CM | POA: Diagnosis not present

## 2017-02-15 DIAGNOSIS — G4733 Obstructive sleep apnea (adult) (pediatric): Secondary | ICD-10-CM | POA: Insufficient documentation

## 2017-02-15 DIAGNOSIS — I480 Paroxysmal atrial fibrillation: Secondary | ICD-10-CM | POA: Diagnosis not present

## 2017-02-15 DIAGNOSIS — Z8546 Personal history of malignant neoplasm of prostate: Secondary | ICD-10-CM | POA: Insufficient documentation

## 2017-02-15 DIAGNOSIS — E1121 Type 2 diabetes mellitus with diabetic nephropathy: Secondary | ICD-10-CM | POA: Diagnosis not present

## 2017-02-15 DIAGNOSIS — Z933 Colostomy status: Secondary | ICD-10-CM | POA: Diagnosis not present

## 2017-02-15 MED ORDER — ATORVASTATIN CALCIUM 40 MG PO TABS: 40.0000 mg | ORAL_TABLET | Freq: Every day | ORAL | 6 refills | Status: DC

## 2017-02-15 NOTE — Patient Instructions (Signed)
Start Atorvastatin 40 mg daily, take every evening  You have been referred to Dr Dwyane Dee at [facility]  We will contact you in 6 months to schedule your next appointment.

## 2017-02-15 NOTE — Progress Notes (Signed)
Patient ID: [name], male   DOB: [birth year], 61 y.o.   MRN: [id number]   ADVANCED HF CLINIC NOTE  PCP: Dr. [name] Oncologist: Dr Truett Perna.  CHF: Kieth Hartis  61 yo with history of CAD and systolic HF due to ischemic cardiomyopathy EF 20-25%, BiV ICD upgrade 2009, chronic atrial fibrillation s/p AVN ablation 9/16, CKD, and traumatic SAH in 1/16 after a fall.  He has been followed at the heart failure clinic at [facility] by Dr Devonne Doughty in the past.   He was admitted in 3/16 from the [facility] ER with exertional dyspnea/volume overload and had a cardiac arrest/ventricular fibrillation while in the hospital terminated by his ICD. After diuresis, RHC showed relatively normal filling pressures and preserved cardiac index.  Creatinine peaked at 3.0. (was 5.0 in 1/16).  Echo in 3/16 showed EF 25-30% with restrictive diastolic function.  Cardiolite was done, showing areas of scar but no ischemia.  EF 18%.    Admitted 4/16 with hematuria and elevated LFTs. Questionable amiodarone toxicity and this was stopped.   In 5/16 underwent elective BiV ICD generator change which went without complication. He was admitted again 04/23/15 with acute on chronic CHF and respiratory failure. During that admission he ruled in for a NSTEMI- Troponin 4.29. He has chronic stage 4 CKD and it was decided to obtain a Myoview before considering a cath. Myoview 04/26/15 showed EF 20% with large region of prior infarct involving the apical anterior, anteroseptal, inferoseptal and inferior walls with extension to the true apex. Small region of reversible/inducible ischemia along the margin of the prior infarct at the mid ventricular anterior and anteroseptal walls. No change from previous.   Admitted to [facility] in 8/16 for increased dyspnea and volume overload. Diuresed with IV lasix and transitioned to 40 mg daily. D/C weight  200 pounds.  Redmitted in 9/16 with bleeding from stoma site and atrial fibrillation with RVR.  Eliquis was  held and bleeding stopped.  Eliquis was restarted prior to discharge. He was noted to have < 50% BiV pacing and also to be very volume overloaded. He was then diuresed and had AV nodal ablation.    Readmitted 10/16 with volume overload and worsening renal failure.. Diuresed 32 pounds. Went home 189 pounds. Switched to torsemide on d/c 80/40  Admitted 6/4 -05/12/16 with Abd/chest pain.  Cardiac work up negative. ABD [place] with Cholelithiasis without acute cholecystitis and cirrhosis without ascites. Pt declined HIDA scan. No surgery planned.  Plan on percutaneous drain placement as outpatient if patient were to become symptomatic.  Echo 05/11/16 with LVEF 10-15%, mild AI, mild MR, normal RV. Discharge weight 188 lbs.   Readmitted 7/17 with cholangitis and sepsis. Required ERCP with biliary stent. C/b acute on chronic renal failure requiring HD.   Returns today for HF follow up. He is feeling well, sleeping in a recliner like he has for years. Weighs daily, and weight range 177-180 pounds. When he gets above182 pounds, takes one metolazone 2.5mg . He last took a metolazone about 1 week ago when his weight got to 183 pounds. He had eaten a high salt meal the day prior. Eats potato chips and popcorn for snacks, eats out occasionally for breakfast. Admits that he does not usually follow a <2gm salt diet. Required HD last year from July 2017-Nov. 2017 after a prolonged hospitalization with cholangitis and sepsis. Does not get SOB with walking up stairs, able to fish and deer hunt without SOB. 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 Taking  all medications with compliance.   Echo 8/17 EF 15-20%  Corevue: Chronic AF no VT/VF. Fluid slightly elevated since stopping HD   Labs (12/15): LDL 123 Labs (3/16): K 3.8, creatinine 2.78 Labs (03/10/2015): K 4.5 Creatinine 2.49  TSH 4.58, LFTs normal, digoxin 0.8 Labs (04/26/15): K 3.5 creatinine 2.13 Labs (08/04/2015): K 3.3 Creatinine 3.01  Labs (9/16): K 4.1,  creatinine 2.7, HCT 30.8 Labs (4/17): K 3.5, creatinine 2.67 Labs (05/12/16): K 3.7, creatinine 2.66 Labs (10/20/16): K 3.6, creatinine 2.71 Labs (01/26/17) K 3.1, creatinine 2.70  PMH: 1. HTN 2. Type II diabetes 3. CAD: s/p BMS LAD in 2001, PTCA ramus and BMS LAD in 2009.  Cardiolite (3/16) with EF 18%, no ischemia, prior anterior, apical and inferior infarction. NSTEMI in 5/16.  Myoview done 04/26/15 showed EF 20% with large region of prior infarct involving the apical anterior, anteroseptal, inferoseptal and inferior walls with extension to the true apex. Small region of reversible/inducible ischemia along the margin of the prior infarct at the mid ventricular anterior and anteroseptal walls. No change from previous.  4. Chronic systolic CHF: Ischemic CMP.  St Jude CRT-D.  Echo (3/16) with EF 25-30%, restrictive diastolic function, mild LVH, mild MR.  RHC (3/16) with mean RA 9, PA 47/29, mean PCWP 16, CI 2.5 (Fick).  Suspect ACEI cough. Echo (7/17) with EF 15-20%. Myoview 18%.   5. SAH: 1/16 after fall (traumatic).   6. GERD 7. Atrial fibrillation: Paroxysmal initially but now chronic.  Now s/p AV nodal ablation to allow BiV pacing. 8. Rectal cancer: s/p surgery. Has colostomy.  9. Prostate cancer: s/p chemo/radiation.  10. H/o TIA 11. OSA: On CPAP.  12. Hyperlipidemia 13. CKD stage IV: Followed by Dr Kristian Covey.  14. Bleeding from stoma.   SH: Married, lives in [place], nonsmoker.   FH: CAD  ROS: All systems reviewed and negative except as per HPI.   Current Outpatient Prescriptions  Medication Sig Dispense Refill  . apixaban (ELIQUIS) 5 MG TABS tablet Take 1 tablet (5 mg total) by mouth 2 (two) times daily. 60 tablet 6  . ferrous sulfate 325 (65 FE) MG tablet Take 1 tablet (325 mg total) by mouth 2 (two) times daily with a meal. 60 tablet 0  . levothyroxine (SYNTHROID, LEVOTHROID) 25 MCG tablet Take 25 mcg by mouth daily before breakfast.    . metoprolol succinate (TOPROL-XL) 25 MG  24 hr tablet take 1/2 tablet by mouth twice a day 45 tablet 3  . Omega-3 Fatty Acids (FISH OIL) 1000 MG CAPS Take 1,000 mg by mouth 2 (two) times daily.    . pantoprazole (PROTONIX) 40 MG tablet Take 1 tablet (40 mg total) by mouth daily. 30 tablet 0  . potassium chloride SA (K-DUR,KLOR-CON) 20 MEQ tablet Take 20 mEq by mouth daily.    Marland Kitchen PROAIR RESPICLICK 108 (90 BASE) MCG/ACT AEPB Inhale 2 puffs into the lungs every 6 (six) hours as needed (for breathing).   0  . torsemide (DEMADEX) 20 MG tablet Take 4 tablets (80 mg total) by mouth daily. 120 tablet 6  . metolazone (ZAROXOLYN) 2.5 MG tablet take 1 tablet by mouth weekly if needed for SWELLING (Patient not taking: Reported on 02/15/2017) 12 tablet 0  . nitroGLYCERIN (NITROLINGUAL) 0.4 MG/SPRAY spray Place 1 spray under the tongue every 5 (five) minutes x 3 doses as needed for chest pain. Reported on 06/01/2016     No current facility-administered medications for this encounter.     Vitals:   02/15/17 1042  BP: 104/60  Pulse: 72  SpO2: 100%  Weight: 187 lb 8 oz (85 kg)    Wt Readings from Last 3 Encounters:  02/15/17 187 lb 8 oz (85 kg)  01/26/17 178 lb (80.7 kg)  01/11/17 185 lb 12.8 oz (84.3 kg)    General: NAD, no SOB at rest.  Neck: No JVP. Supple, no thyromegaly or thyroid nodule.  Lungs: Clear in all lobes.    CV: Nondisplaced PMI.  Regular S1S2, 2/6 systolic murmer at RUSB.  No edema. No carotid bruit.  Normal pedal pulses.  Abdomen: Soft, mildly tender, ND, no HSM appreciated. S/p colostomy.  Skin: Intact without lesions or rashes.  Neurologic: Alert and oriented x 3.  Psych: Normal affect. Extremities: No clubbing or cyanosis.  Diffuse ecchymosis on bilateral forearms.  HEENT: Normal.   Assessment/Plan: 1. Chronic systolic CHF: Ischemic cardiomyopathy.  Echo 07/2016 with LVEF 15-20%, grade 3 dd, mild AI, mild MR, RV moderately dilated.  Has been followed for a long time at [facility].  Deemed not transplant or VAD candidate  with comorbities and advanced CKD. Has St Jude CRT-D, now s/p AV nodal ablation to promote BiV pacing.  - Stable. NYHA II-early III - Volume status stable, continue current dose of Torsemide 80mg  daily.  - Continue metolazone prn for weight gain.  - Continue Toprol 12.5 bid. Cannot increase dose as BP is soft.  - No ACEI, spironolactone, digoxin with advanced CKD.   - No hydral/isosorbide with low BP.  - Given CKD and previous cancer not good VAD or transplant candidate (would need heart/kidney)  - Consider repeat echo at next visits 2. Atrial fibrillation: Chronic. CHADS2Vasc Score 5. s/p AV nodal ablation - Feels great s/p AV node ablation.  - Continue Eliquis, no melena.  3. CAD:  NSTEMI in 5/16.  - Cardiolite without high ischemic burden. EF is decreased from previous, but poor cath candidate with renal failure. - No ASA given AC - Denies chest pain.   4. CKD: no longer on HD.  - Followed by Dr Kristian Covey in [place].  5.  DM2: Would like referral today to endocrinology, referral made to Dr. Lucianne Muss per patient's request.  - Last A1c was 11.2 in July 2017.  6. H/O rectal cancer 2008: Has diverting colostomy. Follows with Dr. Truett Perna.  - CT (05/02/16) without evidence of reoccurrence.  7. Hyperlipidemia: LDL is 117 in 05/2016 -  Not on statin. Would add statin with history of ischemia and DM, has BMS in LAD.  - ASCVD risk is 11.2%, will add Atorvastatin 40mg  daily.  8. Chronic Cholecystitis with recent cholangitis and biliary stent by Dr. Madilyn Fireman. - Biliary stent removed in Feb. 2017  Follow up in 6 months.   Robert Ishikawa, MD 02/15/17   Patient seen and examined with Suzzette Righter, NP. We discussed all aspects of the encounter. I agree with the assessment and plan as stated above.   Overall stable from HF perspective. NYHA II-early III. Volume status looks good. Using sliding scale diuretic regimen well. CAD is stable. No nagina. Agree with restarting statin. Will refer to Endocrine to  help with DM2 management. May benefit from [place]. Repeat echo at next visit.   Robert Mollica,MD 3:04 PM

## 2017-02-24 ENCOUNTER — Telehealth: Payer: Self-pay

## 2017-02-24 ENCOUNTER — Encounter (HOSPITAL_COMMUNITY): Payer: Self-pay

## 2017-02-24 ENCOUNTER — Ambulatory Visit (INDEPENDENT_AMBULATORY_CARE_PROVIDER_SITE_OTHER): Payer: Medicare Other | Admitting: Gastroenterology

## 2017-02-24 ENCOUNTER — Other Ambulatory Visit (HOSPITAL_COMMUNITY): Payer: Self-pay

## 2017-02-24 ENCOUNTER — Encounter: Payer: Self-pay | Admitting: Gastroenterology

## 2017-02-24 VITALS — BP 106/67 | HR 91 | Temp 98.1°F | Ht 68.0 in | Wt 188.4 lb

## 2017-02-24 DIAGNOSIS — D649 Anemia, unspecified: Secondary | ICD-10-CM

## 2017-02-24 DIAGNOSIS — I255 Ischemic cardiomyopathy: Secondary | ICD-10-CM | POA: Diagnosis not present

## 2017-02-24 DIAGNOSIS — K7460 Unspecified cirrhosis of liver: Secondary | ICD-10-CM

## 2017-02-24 LAB — CBC
HEMATOCRIT: 41.8 % (ref 38.5–50.0)
Hemoglobin: 13.5 g/dL (ref 13.2–17.1)
MCH: 31.3 pg (ref 27.0–33.0)
MCHC: 32.3 g/dL (ref 32.0–36.0)
MCV: 96.8 fL (ref 80.0–100.0)
MPV: 9.7 fL (ref 7.5–12.5)
Platelets: 123 10*3/uL — ABNORMAL LOW (ref 140–400)
RBC: 4.32 MIL/uL (ref 4.20–5.80)
RDW: 15 % (ref 11.0–15.0)
WBC: 5.6 10*3/uL (ref 3.8–10.8)

## 2017-02-24 LAB — IRON AND TIBC
%SAT: 17 % (ref 15–60)
Iron: 59 ug/dL (ref 50–180)
TIBC: 340 ug/dL (ref 250–425)
UIBC: 281 ug/dL (ref 125–400)

## 2017-02-24 LAB — FERRITIN: Ferritin: 183 ng/mL (ref 20–380)

## 2017-02-24 MED ORDER — TORSEMIDE 20 MG PO TABS: ORAL_TABLET | ORAL | 6 refills | Status: DC

## 2017-02-24 NOTE — Progress Notes (Unsigned)
Patient reports discrepancy in his torsemide Rx. States he has been taking 80/40 every day for "a long time, months", and noticed when he refilled his Rx today that bottle says only to take 80 mg once in am. Our records and notes indicate we had him taking 80 mg once daily, however was seen last week by Dr. Haroldine Laws and volume was stable on exam and patient feels well, asympomatic, and was advised to continue current diuretic regimen at that time. Last bmet 1 month ago with slight hypokalemia at 3.1 (taking 20 meq once daily), BUN/Cr 62/2.7 and prn metolazone. Advised to keep doing his current daily regimen as he has been, Rx adjusted in chart to correct dose as reportedly taking by patient, and will forward to Dr. Haroldine Laws to review to make sure no additional changes need to be made at this time. Patient due to follow back up with Dr. Haroldine Laws in September.  Renee Pain, RN

## 2017-02-24 NOTE — Progress Notes (Signed)
Referring Provider: Redmond School, MD Primary Care Physician:  Glo Herring., MD Primary GI: Dr. Gala Romney   Chief Complaint  Patient presents with  . Dark stools    HPI:   [name] is a 61 y.o. male presenting today with a history of stage III rectal carcinoma, diagnosed in 2008, s/p APR/chemoradiation therapy. Most recent colonoscopy completed in Sept 2016 by Dr. Havery Moros in [place] with diminutive polyp not manipulated due to presence of Eliquis and GI bleed: it was found that the stoma was friable and bleeding. He will need an early interval colonoscopy at some point in the next few months if cardiology deems him appropriate. Since we have seen him, he was hospitalized for cholecystitis, possible choledocholithiasis, s/p ERCP in July 2017 with dilated bile duct but no obvious stones, sphincterotomy and stent placement. Repeat ERCP in Feb 2018 with CBD stones noted s/p sphincterotomy and balloon extraction, stent removal. He was not felt to be a good candidate for elective cholecystectomy.   States stool in colostomy got black when placed on iron. Started around the time of when the stent was placed. Quit taking iron a few days ago and saw that it turned back to brown. Appetite is good, food tastes good. Will eat about half of what he wants to eat and then he is full. No reflux, dysphagia. No N/V. Sometimes food gets hung up in his throat. Eating an orange or tangerine is difficult.   US abdomen July 2017 with mildly nodular contour. He states he was never told he had liver disease. Cirrhosis is mentioned on several prior images. Thrombocytopenia noted. LFTs normal except for mildly elevated bilirubin at 1.4. Hep A antibody non-reactive in 2016, Hep BsAg negative, Hep B surface Antibody non-reactive in July 2017, Hep B core antibody non-reactive in 2016, Hep C negative in April 2016.    Past Medical History:  Diagnosis Date  . Adenocarcinoma of rectum ([place])    a.  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 Anemia   . CAD (coronary artery disease)    a. BMS to LAD 2001 at [facility] b. PTCA/atherectomy ramus and BMS to LAD 2009  . CHF (congestive heart failure) ([place])   . Cholelithiasis 06/2015  . Chronic kidney disease, stage IV (severe) ([place])   . Chronic systolic heart failure ([place])   . Colostomy in place [facility])   . Dizziness    a. chronic. Admission for this 07/18/2014  . DM type 2, uncontrolled, with renal complications ([place])   . Essential hypertension, benign   . GERD (gastroesophageal reflux disease)   . HCAP (healthcare-associated pneumonia) 07/21/2015  . Hematuria   . History of blood transfusion    "I've had 2 units so far this year" (09/27/2015)  . HLD (hyperlipidemia)   . Ischemic cardiomyopathy    EF 18% by nuclear study 2016, multiple myocardial infarctions in past    . Myocardial infarction 2001  . Obesity   . Orthostatic hypotension   . OSA on CPAP   . Paroxysmal atrial fibrillation ([facility])    a. on amiodarone, digoxin and Eliquis  . PONV (postoperative nausea and vomiting)   . Prostate cancer ([place])    a. s/p seed implants with chemo and radiation  . SAH (subarachnoid hemorrhage) ([place])    post-traumatic (fall) [facility] 12/2014  . TIA (transient ischemic attack)     Past Surgical History:  Procedure Laterality Date  . Abdominal and Perineal Resection of Rectum with Total Mesorectal Excision  10/04/2007  . AV FISTULA  PLACEMENT Right 09/16/2015   Procedure: ARTERIOVENOUS (AV) FISTULA CREATION - BRACHIOCEPHALIC;  Surgeon: Elam Dutch, MD;  Location: [place];  Service: Vascular;  Laterality: Right;  . BI-VENTRICULAR IMPLANTABLE CARDIOVERTER DEFIBRILLATOR  (CRT-D)  2009  . CARDIAC CATHETERIZATION  08/2001; 2009   ; [name] 07/10/2013  . CARDIAC DEFIBRILLATOR PLACEMENT  2002  . CARDIAC DEFIBRILLATOR PLACEMENT  2009   Upgraded to a BiV ICD  . COLONOSCOPY  09/14/2011   Dr. Gala Romney: via colostomy, Single pedunculated benign inflammatory polyp. Due for surveillance Oct 2015   . COLONOSCOPY N/A 07/02/2014   Procedure: COLONOSCOPY;  Surgeon: Daneil Dolin, MD;  [facility];  Service: Endoscopy;  Laterality: N/A;  7:30 / COLONOSCOPY THRU COLOSTOMY  . COLONOSCOPY N/A 12/11/2014   Dr. Gala Romney via colostomy. Normal. Repeat in 2021.   Marland Kitchen COLONOSCOPY N/A 08/24/2015   Dr. Havery Moros: diminutive polyp not removed, stoma site of bleeding  . COLOSTOMY  2008  . CORONARY ANGIOPLASTY WITH STENT PLACEMENT  2001; ~ 2006   "1 + 1"   . ELECTROPHYSIOLOGIC STUDY N/A 08/28/2015   Procedure: AV Node Ablation;  Surgeon: Will Meredith Leeds, MD;  Location: [place] CV LAB;  Service: Cardiovascular;  Laterality: N/A;  . EP IMPLANTABLE DEVICE N/A 04/10/2015   Procedure: Ppm/Biv Ppm Generator Changeout;  Surgeon: Evans Lance, MD;  Location: [place] INVASIVE CV LAB CUPID;  Service: Cardiovascular;  Laterality: N/A;  . ERCP N/A 06/11/2016   Procedure: ENDOSCOPIC RETROGRADE CHOLANGIOPANCREATOGRAPHY (ERCP);  Surgeon: Teena Irani, MD;  Location: [facility] ENDOSCOPY;  Service: Endoscopy;  Laterality: N/A;  . ERCP N/A 01/26/2017   Procedure: ENDOSCOPIC RETROGRADE CHOLANGIOPANCREATOGRAPHY (ERCP);  Surgeon: Teena Irani, MD;  Location: [facility] ENDOSCOPY;  Service: Endoscopy;  Laterality: N/A;  . ERCP  06/2016   Dr. Amedeo Plenty: duodenal fistula, dilated bile duct but no obvious stones, s/p sphincterotomy and stent placement  . ERCP  01/2017   Dr. Amedeo Plenty: CBD stones, s/p sphincterotomy, balloon extraction, stent removal  . ESOPHAGOGASTRODUODENOSCOPY N/A 07/02/2014   Procedure: ESOPHAGOGASTRODUODENOSCOPY (EGD);  Surgeon: Daneil Dolin, MD;  [facility];  Service: Endoscopy;  Laterality: N/A;  7:30  . ESOPHAGOGASTRODUODENOSCOPY N/A 12/11/2014   PYK:DXIPJA EGD  . GASTROINTESTINAL STENT REMOVAL N/A 01/26/2017   Procedure: GASTROINTESTINAL STENT REMOVAL;  Surgeon: Teena Irani, MD;  Location: [place];  Service: Endoscopy;  Laterality: N/A;  . GIVENS CAPSULE STUDY N/A 07/23/2015   Dr. Michail Sermon: small  non-bleeding AVM, mild gastritis  . LEFT HEART CATHETERIZATION WITH CORONARY ANGIOGRAM N/A 07/13/2013   Procedure: LEFT HEART CATHETERIZATION WITH CORONARY ANGIOGRAM;  Surgeon: Lorretta Harp, MD;  Location: [facility] CATH LAB;  Service: Cardiovascular;  Laterality: N/A;  . Venia Minks DILATION N/A 07/02/2014   Procedure: Venia Minks DILATION;  Surgeon: Daneil Dolin, MD;  [facility];  Service: Endoscopy;  Laterality: N/A;  7:30  . PORTACATH PLACEMENT  06/2007   "removed ~ 1 yr later"  . RIGHT HEART CATHETERIZATION N/A 02/24/2015   Procedure: RIGHT HEART CATH;  Surgeon: Jolaine Artist, MD;  Location: [facility] C[facility] LAB;  Service: Cardiovascular;  Laterality: N/A;  . SAVORY DILATION N/A 07/02/2014   Procedure: SAVORY DILATION;  Surgeon: Daneil Dolin, MD;  [facility];  Service: Endoscopy;  Laterality: N/A;  7:30    Current Outpatient Prescriptions  Medication Sig Dispense Refill  . apixaban (ELIQUIS) 5 MG TABS tablet Take 1 tablet (5 mg total) by mouth 2 (two) times daily. 60 tablet 6  . atorvastatin (LIPITOR) 40 MG tablet  Take 1 tablet (40 mg total) by mouth daily. 30 tablet 6  . levothyroxine (SYNTHROID, LEVOTHROID) 25 MCG tablet Take 25 mcg by mouth daily before breakfast.    . metoprolol succinate (TOPROL-XL) 25 MG 24 hr tablet take 1/2 tablet by mouth twice a day 45 tablet 3  . nitroGLYCERIN (NITROLINGUAL) 0.4 MG/SPRAY spray Place 1 spray under the tongue every 5 (five) minutes x 3 doses as needed for chest pain. Reported on 06/01/2016    . Omega-3 Fatty Acids (FISH OIL) 1000 MG CAPS Take 1,000 mg by mouth 2 (two) times daily.    . pantoprazole (PROTONIX) 40 MG tablet Take 1 tablet (40 mg total) by mouth daily. 30 tablet 0  . potassium chloride SA (K-DUR,KLOR-CON) 20 MEQ tablet Take 20 mEq by mouth daily.    Marland Kitchen PROAIR RESPICLICK 161 (90 BASE) MCG/ACT AEPB Inhale 2 puffs into the lungs every 6 (six) hours as needed (for breathing).   0  . ferrous sulfate 325 (65 FE) MG tablet Take 1  tablet (325 mg total) by mouth 2 (two) times daily with a meal. (Patient not taking: Reported on 02/24/2017) 60 tablet 0  . metolazone (ZAROXOLYN) 2.5 MG tablet take 1 tablet by mouth weekly if needed for SWELLING (Patient not taking: Reported on 02/24/2017) 12 tablet 0  . torsemide (DEMADEX) 20 MG tablet Take 80 mg (4 tabs) in am and 40 mg (2 tabs) in pm 180 tablet 6   No current facility-administered medications for this visit.     Allergies as of 02/24/2017  . (No Known Allergies)    Family History  Problem Relation Age of Onset  . Colon cancer Mother 108  . Coronary artery disease Father   . Colon cancer Sister 33  . Colon cancer Other     2 cousins, succumbed to illness    Social History   Social History  . Marital status: Married    Spouse name: N/A  . Number of children: 1  . Years of education: N/A   Occupational History  .  Unemployed   Social History Main Topics  . Smoking status: Never Smoker  . Smokeless tobacco: Never Used  . Alcohol use No     Comment: Former user 45 years ago  . Drug use: No  . Sexual activity: No   Other Topics Concern  . None   Social History Narrative   Married 43 years   1 son, 2 grandkids    Denies alcohol, drugs, tobacco     Review of Systems: As mentioned in HPI   Physical Exam: BP 106/67   Pulse 91   Temp 98.1 F (36.7 C) (Oral)   Ht 5\' 8"  (1.727 m)   Wt 188 lb 6.4 oz (85.5 kg)   BMI 28.65 kg/m  General:   Alert and oriented. No distress noted. Pleasant and cooperative.  Head:  Normocephalic and atraumatic. Eyes:  Conjuctiva clear without scleral icterus. Mouth:  Oral mucosa pink and moist. Good dentition. No lesions. Heart:  S1, S2 present irregularly irregular, systolic murmur  Abdomen:  +BS, soft, non-tender. Fullness noted in right/left flank, no obvious tense ascites Msk:  Symmetrical without gross deformities.  Extremities:  Without edema. Neurologic:  Alert and  oriented x4 Psych:  Alert and cooperative.  Normal mood and affect.  Lab Results  Component Value Date   HAV NON REACTIVE 02/24/2017   HEPAIGM NON REACTIVE 04/03/2015   HEPBIGM NON REACTIVE 04/03/2015   HEPBCAB Negative 06/15/2016   Lab  Results  Component Value Date   ALT 11 (L) 01/26/2017   AST 22 01/26/2017   ALKPHOS 72 01/26/2017   BILITOT 1.4 (H) 01/26/2017

## 2017-02-24 NOTE — Patient Instructions (Addendum)
Please have blood work done today. I have also ordered an ultrasound of your liver. I have attached some information about cirrhosis. I think this may be due to a fatty liver and history of heart issues that has caused this. We will monitor this closely for you!   We will see about clearance for a colonoscopy and upper endoscopy.   Further recommendations to follow!   Cirrhosis Cirrhosis is long-term (chronic) liver injury. The liver is your largest internal organ, and it performs many functions. The liver converts food into energy, removes toxic material from your blood, makes important proteins, and absorbs necessary vitamins from your diet. If you have cirrhosis, it means many of your healthy liver cells have been replaced by scar tissue. This prevents blood from flowing through your liver, which makes it difficult for your liver to function. This scarring is not reversible, but treatment can prevent it from getting worse. What are the causes? Hepatitis C and long-term alcohol abuse are the most common causes of cirrhosis. Other causes include:  Nonalcoholic fatty liver disease.  Hepatitis B infection.  Autoimmune hepatitis.  Diseases that cause blockage of ducts inside the liver.  Inherited liver diseases.  Reactions to certain long-term medicines.  Parasitic infections.  Long-term exposure to certain toxins. What increases the risk? You may have a higher risk of cirrhosis if you:  Have certain hepatitis viruses.  Abuse alcohol, especially if you are male.  Are overweight.  Share needles.  Have unprotected sex with someone who has hepatitis. What are the signs or symptoms? You may not have any signs and symptoms at first. Symptoms may not develop until the damage to your liver starts to get worse. Signs and symptoms of cirrhosis may include:  Tenderness in the right-upper part of your abdomen.  Weakness and tiredness (fatigue).  Loss of  appetite.  Nausea.  Weight loss and muscle loss.  Itchiness.  Yellow skin and eyes (jaundice).  Buildup of fluid in the abdomen (ascites).  Swelling of the feet and ankles (edema).  Appearance of tiny blood vessels under the skin.  Mental confusion.  Easy bruising and bleeding. How is this diagnosed? Your health care provider may suspect cirrhosis based on your symptoms and medical history, especially if you have other medical conditions or a history of alcohol abuse. Your health care provider will do a physical exam to feel your liver and check for signs of cirrhosis. Your health care provider may perform other tests, including:  Blood tests to check:  Whether you have hepatitis B or C.  Kidney function.  Liver function.  Imaging tests such as:  MRI or CT scan to look for changes seen in advanced cirrhosis.  Ultrasound to see if normal liver tissue is being replaced by scar tissue.  A procedure using a long needle to take a sample of liver tissue (biopsy) for examination under a microscope. Liver biopsy can confirm the diagnosis of cirrhosis. How is this treated? Treatment depends on how damaged your liver is and what caused the damage. Treatment may include treating cirrhosis symptoms or treating the underlying causes of the condition to try to slow the progression of the damage. Treatment may include:  Making lifestyle changes, such as:  Eating a healthy diet.  Restricting salt intake.  Maintaining a healthy weight.  Not abusing drugs or alcohol.  Taking medicines to:  Treat liver infections or other infections.  Control itching.  Reduce fluid buildup.  Reduce certain blood toxins.  Reduce risk of  bleeding from enlarged blood vessels in the stomach or esophagus (varices).  If varices are causing bleeding problems, you may need treatment with a procedure that ties up the vessels causing them to fall off (band ligation).  If cirrhosis is causing your  liver to fail, your health care provider may recommend a liver transplant.  Other treatments may be recommended depending on any complications of cirrhosis, such as liver-related kidney failure (hepatorenal syndrome). Follow these instructions at home:  Take medicines only as directed by your health care provider. Do not use drugs that are toxic to your liver. Ask your health care provider before taking any new medicines, including over-the-counter medicines.  Rest as needed.  Eat a well-balanced diet. Ask your health care provider or dietitian for more information.  You may have to follow a low-salt diet or restrict your water intake as directed.  Do not drink alcohol. This is especially important if you are taking acetaminophen.  Keep all follow-up visits as directed by your health care provider. This is important. Contact a health care provider if:  You have fatigue or weakness that is getting worse.  You develop swelling of the hands, feet, legs, or face.  You have a fever.  You develop loss of appetite.  You have nausea or vomiting.  You develop jaundice.  You develop easy bruising or bleeding. Get help right away if:  You vomit bright red blood or a material that looks like coffee grounds.  You have blood in your stools.  Your stools appear black and tarry.  You become confused.  You have chest pain or trouble breathing. This information is not intended to replace advice given to you by your health care provider. Make sure you discuss any questions you have with your health care provider. Document Released: 11/22/2005 Document Revised: 04/01/2016 Document Reviewed: 07/31/2014 Elsevier Interactive Patient Education  2017 [employer].

## 2017-02-24 NOTE — Telephone Encounter (Signed)
Dr.Bensimhon,  We saw this patient today for anemia and he needs a colonoscopy and EGD, Vicente Males is requesting cardiac clearance prior to [place] scheduling him. Is it ok to proceed with procedures?   Thank you!

## 2017-02-25 ENCOUNTER — Encounter: Payer: Self-pay | Admitting: Gastroenterology

## 2017-02-25 LAB — HEPATITIS B SURFACE ANTIBODY,QUALITATIVE

## 2017-02-25 LAB — HEPATITIS A ANTIBODY, TOTAL: HEP A TOTAL AB: NONREACTIVE

## 2017-02-25 NOTE — Progress Notes (Signed)
Agree. Continue 80/40. Let's update our records.

## 2017-02-25 NOTE — Assessment & Plan Note (Addendum)
Thorough evaluation in 2016. Remains on Eliquis. History of rectal cancer and needs early interval colonoscopy for surveillance, as last colonoscopy in 2016 showed a diminutive polyp that was non-manipulated in the setting of acute GI bleeding. We will need to obtain cardiac clearance for this due to his multiple co-morbidities. Will also need EGD at time of colonoscopy for variceal screening. Check CBC, iron, ferritin, TIBC now.   ADDENDUM: cardiac clearance on file.

## 2017-02-25 NOTE — Telephone Encounter (Signed)
That's fine. Please watch BP closely and do your best to avoid hypotension. Thanks

## 2017-02-25 NOTE — Assessment & Plan Note (Signed)
Noted incidentally on imaging; he will need to be enrolled for hepatoma surveillance. Viral hepatitis serologies negative. Will order additional serologies to assess for immunity, and he will need vaccinations as appropriate. I suspect etiology of cirrhosis is fatty liver/cardiogenic with his history of heart failure. He appears compensated currently. Will order updated US abdomen and enroll him in cirrhosis care.

## 2017-02-28 ENCOUNTER — Other Ambulatory Visit: Payer: Self-pay | Admitting: Internal Medicine

## 2017-02-28 ENCOUNTER — Ambulatory Visit (HOSPITAL_COMMUNITY)
Admission: RE | Admit: 2017-02-28 | Discharge: 2017-02-28 | Disposition: A | Discharge status: Home / Self Care | Payer: Medicare Other | Source: Ambulatory Visit | Attending: Gastroenterology | Admitting: Gastroenterology

## 2017-02-28 DIAGNOSIS — K7460 Unspecified cirrhosis of liver: Secondary | ICD-10-CM | POA: Diagnosis not present

## 2017-02-28 DIAGNOSIS — K8020 Calculus of gallbladder without cholecystitis without obstruction: Secondary | ICD-10-CM | POA: Insufficient documentation

## 2017-02-28 DIAGNOSIS — J90 Pleural effusion, not elsewhere classified: Secondary | ICD-10-CM | POA: Diagnosis not present

## 2017-02-28 NOTE — Telephone Encounter (Signed)
Thank you Dr.Bensimhon!

## 2017-02-28 NOTE — Progress Notes (Signed)
CC'D TO PCP °

## 2017-03-01 ENCOUNTER — Encounter: Payer: Self-pay | Admitting: Endocrinology

## 2017-03-02 NOTE — Telephone Encounter (Signed)
Thank you :)

## 2017-03-03 DIAGNOSIS — N184 Chronic kidney disease, stage 4 (severe): Secondary | ICD-10-CM | POA: Diagnosis not present

## 2017-03-03 DIAGNOSIS — E559 Vitamin D deficiency, unspecified: Secondary | ICD-10-CM | POA: Diagnosis not present

## 2017-03-03 DIAGNOSIS — Z79899 Other long term (current) drug therapy: Secondary | ICD-10-CM | POA: Diagnosis not present

## 2017-03-03 DIAGNOSIS — R809 Proteinuria, unspecified: Secondary | ICD-10-CM | POA: Diagnosis not present

## 2017-03-03 DIAGNOSIS — E1129 Type 2 diabetes mellitus with other diabetic kidney complication: Secondary | ICD-10-CM | POA: Diagnosis not present

## 2017-03-03 DIAGNOSIS — D509 Iron deficiency anemia, unspecified: Secondary | ICD-10-CM | POA: Diagnosis not present

## 2017-03-03 DIAGNOSIS — I10 Essential (primary) hypertension: Secondary | ICD-10-CM | POA: Diagnosis not present

## 2017-03-09 DIAGNOSIS — E1129 Type 2 diabetes mellitus with other diabetic kidney complication: Secondary | ICD-10-CM | POA: Diagnosis not present

## 2017-03-09 DIAGNOSIS — D649 Anemia, unspecified: Secondary | ICD-10-CM | POA: Diagnosis not present

## 2017-03-09 DIAGNOSIS — N184 Chronic kidney disease, stage 4 (severe): Secondary | ICD-10-CM | POA: Diagnosis not present

## 2017-03-09 DIAGNOSIS — I10 Essential (primary) hypertension: Secondary | ICD-10-CM | POA: Diagnosis not present

## 2017-03-14 ENCOUNTER — Telehealth: Payer: Self-pay

## 2017-03-14 NOTE — Progress Notes (Signed)
No evidence of HCC on ultrasound. Small gallstones noted; he was seen inpatient for cholecystitis and not felt to be a candidate at that time. He is asymptomatic.

## 2017-03-14 NOTE — Progress Notes (Addendum)
He will need Eliquis to be held for 48 hours prior, which was cleared with cardiology.

## 2017-03-14 NOTE — Progress Notes (Signed)
Hep B surface antibody indeterminate but non-reactive 9 months ago. Total Hep A antibody negative. Needs Hep A/B vaccination. Ferritin normal at 183. Hgb improved at 13.5. He has been cleared for colonoscopy/EGD with Dr. Gala Romney. Recommend with Propofol so he can be closely monitored. Will need colonoscopy via ostomy.

## 2017-03-14 NOTE — Telephone Encounter (Signed)
See result note.  

## 2017-03-14 NOTE — Telephone Encounter (Signed)
Pt is calling to see about his [place] results. Please advise

## 2017-03-15 ENCOUNTER — Other Ambulatory Visit: Payer: Self-pay

## 2017-03-15 DIAGNOSIS — J069 Acute upper respiratory infection, unspecified: Secondary | ICD-10-CM | POA: Diagnosis not present

## 2017-03-15 DIAGNOSIS — J209 Acute bronchitis, unspecified: Secondary | ICD-10-CM | POA: Diagnosis not present

## 2017-03-15 DIAGNOSIS — J343 Hypertrophy of nasal turbinates: Secondary | ICD-10-CM | POA: Diagnosis not present

## 2017-03-15 DIAGNOSIS — I2510 Atherosclerotic heart disease of native coronary artery without angina pectoris: Secondary | ICD-10-CM | POA: Diagnosis not present

## 2017-03-15 DIAGNOSIS — E063 Autoimmune thyroiditis: Secondary | ICD-10-CM | POA: Diagnosis not present

## 2017-03-15 DIAGNOSIS — Z6828 Body mass index (BMI) 28.0-28.9, adult: Secondary | ICD-10-CM | POA: Diagnosis not present

## 2017-03-15 DIAGNOSIS — Z1389 Encounter for screening for other disorder: Secondary | ICD-10-CM | POA: Diagnosis not present

## 2017-03-15 DIAGNOSIS — R0602 Shortness of breath: Secondary | ICD-10-CM | POA: Diagnosis not present

## 2017-03-15 DIAGNOSIS — K219 Gastro-esophageal reflux disease without esophagitis: Secondary | ICD-10-CM | POA: Diagnosis not present

## 2017-03-15 MED ORDER — PEG-KCL-NACL-NASULF-NA ASC-C 100 G PO SOLR: 1.0000 | ORAL | 0 refills | Status: DC

## 2017-03-15 NOTE — Telephone Encounter (Signed)
Hello Dr.Bensimhon,  Is it ok for this patient to stop his eliquis for his colonoscopy?  Thank you,  Almyra Free

## 2017-03-21 ENCOUNTER — Other Ambulatory Visit: Payer: Self-pay

## 2017-03-21 DIAGNOSIS — D649 Anemia, unspecified: Secondary | ICD-10-CM

## 2017-03-21 DIAGNOSIS — K7460 Unspecified cirrhosis of liver: Secondary | ICD-10-CM

## 2017-03-23 ENCOUNTER — Other Ambulatory Visit: Payer: Self-pay

## 2017-03-23 ENCOUNTER — Ambulatory Visit (INDEPENDENT_AMBULATORY_CARE_PROVIDER_SITE_OTHER): Payer: Medicare Other | Admitting: Endocrinology

## 2017-03-23 ENCOUNTER — Encounter: Payer: Self-pay | Admitting: Endocrinology

## 2017-03-23 VITALS — BP 120/60 | HR 78 | Ht 68.0 in | Wt 181.0 lb

## 2017-03-23 DIAGNOSIS — R634 Abnormal weight loss: Secondary | ICD-10-CM | POA: Diagnosis not present

## 2017-03-23 DIAGNOSIS — I739 Peripheral vascular disease, unspecified: Secondary | ICD-10-CM

## 2017-03-23 DIAGNOSIS — Z794 Long term (current) use of insulin: Secondary | ICD-10-CM | POA: Diagnosis not present

## 2017-03-23 DIAGNOSIS — E1165 Type 2 diabetes mellitus with hyperglycemia: Secondary | ICD-10-CM

## 2017-03-23 DIAGNOSIS — I255 Ischemic cardiomyopathy: Secondary | ICD-10-CM

## 2017-03-23 LAB — POCT GLUCOSE (DEVICE FOR HOME USE)

## 2017-03-23 MED ORDER — INSULIN DEGLUDEC 100 UNIT/ML ~~LOC~~ SOPN: 15.0000 [IU] | PEN_INJECTOR | Freq: Every day | SUBCUTANEOUS | 1 refills | Status: DC

## 2017-03-23 MED ORDER — INSULIN ASPART 100 UNIT/ML FLEXPEN: PEN_INJECTOR | SUBCUTANEOUS | 1 refills | Status: DC

## 2017-03-23 MED ORDER — GLUCOSE BLOOD VI STRP: ORAL_STRIP | 4 refills | Status: DC

## 2017-03-23 NOTE — Progress Notes (Signed)
Patient ID: [name], male   DOB: 07-Jul-1956, 61 y.o.   MRN: [id number]            Reason for Appointment: Consultation for Type 2 Diabetes  Referring physician: Dr. Gala Romney   History of Present Illness:          Date of diagnosis of type 2 diabetes mellitus: 2006        Background history:  He was initially treated with metformin and Actos and was started on insulin very soon after initial diagnosis because of poor control He had been mostly taking Lantus and NovoLog insulin However no records are verbal from his primary care doctor and not clear what his regimen was Before he had renal problems his Lantus dose was about 20-24 units A1c records indicates his levels have been generally over 10% except twice  He was on Toujeo insulin alone for some time until about 6 months ago and he thinks his level of control was very good but A1c readings from last summer are around 11%  Recent history:   INSULIN regimen is:   none      His last A1c appears to be 10.7% done in 01/2017   Non-insulin hypoglycemic drugs the patient is taking are:  Current management, blood sugar patterns and problems identified:  He has not been taking insulin for about 6 months because he could not get insurance coverage for his Toujeo insulin and was not recommended any other treatment    Also he has not been checking his blood sugars for the last several months  Since his cardiologist felt he needed better control he has been referred here for further management.  With his high blood sugars he has been complaining of increasing thirst.  Also the benefiting tired more easily  Blood sugar in the office today after a late breakfast was 279       Side effects from medications have been: None  Compliance with the medical regimen: Poor  Glucose monitoring:  not done, currently only has a Generic monitor     Blood Glucose readings not available  Self-care: The diet that the patient has been  following is: tries to limit drinks which sugar .     Typical meal intake: Breakfast is meat, toast if eating  Eating out at restaurants only twice a week Dietician visit, most recent: Several years ago               Exercise:  none, unable to do much  Weight history: 171-240  Wt Readings from Last 3 Encounters:  03/23/17 181 lb (82.1 kg)  02/24/17 188 lb 6.4 oz (85.5 kg)  02/15/17 187 lb 8 oz (85 kg)    Glycemic control:    Lab Results  Component Value Date   HGBA1C 11.2 (H) 06/09/2016   HGBA1C 11.5 (H) 05/10/2016   HGBA1C 6.9 (H) 08/03/2015   Lab Results  Component Value Date   LDLCALC 117 (H) 05/10/2016   CREATININE 2.70 (H) 01/26/2017   No results found for: MICRALBCREAT  No results found for: FRUCTOSAMINE  Other active problems: See review of systems  Allergies as of 03/23/2017   No Known Allergies     Medication List       Accurate as of 03/23/17  9:00 PM. Always use your most recent med list.          apixaban 5 MG Tabs tablet Commonly known as:  ELIQUIS Take 1 tablet (5 mg total) by mouth  2 (two) times daily.   atorvastatin 40 MG tablet Commonly known as:  LIPITOR Take 1 tablet (40 mg total) by mouth daily.   ferrous sulfate 325 (65 FE) MG tablet Take 1 tablet (325 mg total) by mouth 2 (two) times daily with a meal.   Fish Oil 1000 MG Caps Take 1,000 mg by mouth 2 (two) times daily.   glucose blood test strip Use to test blood sugar 3 times daily   insulin aspart 100 UNIT/ML FlexPen Commonly known as:  NOVOLOG FLEXPEN 5 units before each meal   insulin degludec 100 UNIT/ML Sopn FlexTouch Pen Commonly known as:  TRESIBA FLEXTOUCH Inject 0.15 mLs (15 Units total) into the skin daily.   levothyroxine 25 MCG tablet Commonly known as:  SYNTHROID, LEVOTHROID Take 25 mcg by mouth daily before breakfast.   metolazone 2.5 MG tablet Commonly known as:  ZAROXOLYN take 1 tablet by mouth weekly if needed for SWELLING   metoprolol succinate 25 MG  24 hr tablet Commonly known as:  TOPROL-XL take 1/2 tablet by mouth twice a day   nitroGLYCERIN 0.4 MG/SPRAY spray Commonly known as:  NITROLINGUAL Place 1 spray under the tongue every 5 (five) minutes x 3 doses as needed for chest pain. Reported on 06/01/2016   pantoprazole 40 MG tablet Commonly known as:  PROTONIX Take 1 tablet (40 mg total) by mouth daily.   peg 3350 powder 100 g Solr Commonly known as:  MOVIPREP Take 1 kit (200 g total) by mouth as directed.   potassium chloride SA 20 MEQ tablet Commonly known as:  K-DUR,KLOR-CON Take 20 mEq by mouth daily.   PROAIR RESPICLICK 108 (90 Base) MCG/ACT Aepb Generic drug:  Albuterol Sulfate Inhale 2 puffs into the lungs every 6 (six) hours as needed (for breathing).   torsemide 20 MG tablet Commonly known as:  DEMADEX Take 80 mg (4 tabs) in am and 40 mg (2 tabs) in pm       Allergies: No Known Allergies  Past Medical History:  Diagnosis Date  . Adenocarcinoma of rectum (HCC)    a. 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 Anemia   . CAD (coronary artery disease)    a. BMS to LAD 2001 at [facility] b. PTCA/atherectomy ramus and BMS to LAD 2009  . CHF (congestive heart failure) (HCC)   . Cholelithiasis 06/2015  . Chronic kidney disease, stage IV (severe) (HCC)   . Chronic systolic heart failure (HCC)   . Colostomy in place [facility])   . Dizziness    a. chronic. Admission for this 07/18/2014  . DM type 2, uncontrolled, with renal complications (HCC)   . Essential hypertension, benign   . GERD (gastroesophageal reflux disease)   . HCAP (healthcare-associated pneumonia) 07/21/2015  . Hematuria   . History of blood transfusion    "I've had 2 units so far this year" (09/27/2015)  . HLD (hyperlipidemia)   . Ischemic cardiomyopathy    EF 18% by nuclear study 2016, multiple myocardial infarctions in past    . Myocardial infarction (HCC) 2001  . Obesity   . Orthostatic hypotension   . OSA on CPAP   . Paroxysmal atrial fibrillation (HCC)    a. on  amiodarone, digoxin and Eliquis  . PONV (postoperative nausea and vomiting)   . Prostate cancer (HCC)    a. s/p seed implants with chemo and radiation  . SAH (subarachnoid hemorrhage) (HCC)    post-traumatic (fall) [facility] 12/2014  . TIA (transient ischemic attack)     Past Surgical  History:  Procedure Laterality Date  . Abdominal and Perineal Resection of Rectum with Total Mesorectal Excision  10/04/2007  . AV FISTULA PLACEMENT Right 09/16/2015   Procedure: ARTERIOVENOUS (AV) FISTULA CREATION - BRACHIOCEPHALIC;  Surgeon: Sherren Kerns, MD;  Location: [facility] OR;  Service: Vascular;  Laterality: Right;  . BI-VENTRICULAR IMPLANTABLE CARDIOVERTER DEFIBRILLATOR  (CRT-D)  2009  . CARDIAC CATHETERIZATION  08/2001; 2009   ; Hattie Perch 07/10/2013  . CARDIAC DEFIBRILLATOR PLACEMENT  2002  . CARDIAC DEFIBRILLATOR PLACEMENT  2009   Upgraded to a BiV ICD  . COLONOSCOPY  09/14/2011   Dr. Jena Gauss: via colostomy, Single pedunculated benign inflammatory polyp. Due for surveillance Oct 2015  . COLONOSCOPY N/A 07/02/2014   Procedure: COLONOSCOPY;  Surgeon: Corbin Ade, MD;  Location: [facility] ENDO SUITE;  Service: Endoscopy;  Laterality: N/A;  7:30 / COLONOSCOPY THRU COLOSTOMY  . COLONOSCOPY N/A 12/11/2014   Dr. Jena Gauss via colostomy. Normal. Repeat in 2021.   Marland Kitchen COLONOSCOPY N/A 08/24/2015   Dr. Adela Lank: diminutive polyp not removed, stoma site of bleeding  . COLOSTOMY  2008  . CORONARY ANGIOPLASTY WITH STENT PLACEMENT  2001; ~ 2006   "1 + 1"   . ELECTROPHYSIOLOGIC STUDY N/A 08/28/2015   Procedure: AV Node Ablation;  Surgeon: Will Jorja Loa, MD;  Location: [facility] INVASIVE CV LAB;  Service: Cardiovascular;  Laterality: N/A;  . EP IMPLANTABLE DEVICE N/A 04/10/2015   Procedure: Ppm/Biv Ppm Generator Changeout;  Surgeon: Marinus Maw, MD;  Location: [facility] INVASIVE CV LAB CUPID;  Service: Cardiovascular;  Laterality: N/A;  . ERCP N/A 06/11/2016   Procedure: ENDOSCOPIC RETROGRADE CHOLANGIOPANCREATOGRAPHY (ERCP);  Surgeon: Dorena Cookey, MD;  Location: [facility] ENDOSCOPY;  Service: Endoscopy;  Laterality: N/A;  . ERCP N/A 01/26/2017   Procedure: ENDOSCOPIC RETROGRADE CHOLANGIOPANCREATOGRAPHY (ERCP);  Surgeon: Dorena Cookey, MD;  Location: [facility] ENDOSCOPY;  Service: Endoscopy;  Laterality: N/A;  . ERCP  06/2016   Dr. Madilyn Fireman: duodenal fistula, dilated bile duct but no obvious stones, s/p sphincterotomy and stent placement  . ERCP  01/2017   Dr. Madilyn Fireman: CBD stones, s/p sphincterotomy, balloon extraction, stent removal  . ESOPHAGOGASTRODUODENOSCOPY N/A 07/02/2014   Procedure: ESOPHAGOGASTRODUODENOSCOPY (EGD);  Surgeon: Corbin Ade, MD;  Location: [facility];  Service: Endoscopy;  Laterality: N/A;  7:30  . ESOPHAGOGASTRODUODENOSCOPY N/A 12/11/2014   [id number] EGD  . GASTROINTESTINAL STENT REMOVAL N/A 01/26/2017   Procedure: GASTROINTESTINAL STENT REMOVAL;  Surgeon: Dorena Cookey, MD;  Location: [facility] ENDOSCOPY;  Service: Endoscopy;  Laterality: N/A;  . GIVENS CAPSULE STUDY N/A 07/23/2015   Dr. Bosie Clos: small non-bleeding AVM, mild gastritis  . LEFT HEART CATHETERIZATION WITH CORONARY ANGIOGRAM N/A 07/13/2013   Procedure: LEFT HEART CATHETERIZATION WITH CORONARY ANGIOGRAM;  Surgeon: [name], MD;  Location: [facility] CATH LAB;  Service: Cardiovascular;  Laterality: N/A;  . Elease Hashimoto DILATION N/A 07/02/2014   Procedure: Elease Hashimoto DILATION;  Surgeon: Corbin Ade, MD;  Location: [facility];  Service: Endoscopy;  Laterality: N/A;  7:30  . PORTACATH PLACEMENT  06/2007   "removed ~ 1 yr later"  . RIGHT HEART CATHETERIZATION N/A 02/24/2015   Procedure: RIGHT HEART CATH;  Surgeon: Dolores Patty, MD;  Location: [facility] CATH LAB;  Service: Cardiovascular;  Laterality: N/A;  . SAVORY DILATION N/A 07/02/2014   Procedure: SAVORY DILATION;  Surgeon: Corbin Ade, MD;  Location: [facility] ENDO [facility];  Service: Endoscopy;  Laterality: N/A;  7:30    Family History  Problem Relation Age of Onset  . Colon cancer Mother 76  .  Coronary artery disease Father   .  Colon cancer Sister 57  . Diabetes Sister   . Colon cancer Other     2 cousins, succumbed to illness    Social History:  reports that he has never smoked. He has never used smokeless tobacco. He reports that he does not drink alcohol or use drugs.   Review of Systems  Constitutional: Positive for weight loss and malaise. Negative for reduced appetite.  HENT: Negative for trouble swallowing.   Eyes: Negative for blurred vision.  Respiratory: Negative for cough and shortness of breath.   Cardiovascular: Negative for chest pain and leg swelling.  Gastrointestinal:       He uses a colostomy because of history of rectal cancer  Endocrine: Positive for fatigue and polydipsia.  Genitourinary: Negative for frequency.  Musculoskeletal: Negative for joint pain.  Skin:       Gets periodic skin infection of his lower leg requiring antibiotics treated by PCP  Neurological: Positive for tingling.     Lipid history: Last LDL was 88 as of 01/2017, treated with Lipitor which has been recently restarted    Lab Results  Component Value Date   CHOL 163 05/10/2016   HDL 27 (L) 05/10/2016   LDLCALC 117 (H) 05/10/2016   TRIG 96 05/10/2016   CHOLHDL 6.0 05/10/2016           Hypertension: None, only on metoprolol and diuretics  Most recent eye exam was in 2017  Most recent foot exam: 4/18  ?  Hypothyroidism: He has been told by his PCP that he has hypothyroidism but no details available and no recent labs available   LABS:  Office Visit on 03/23/2017  Component Date Value Ref Range Status  . Glucose Fasting, POC 03/23/2017 279lb  70 - 99 mg/dL Final    Physical Examination:  BP 120/60   Pulse 78   Ht 5\' 8"  (1.727 m)   Wt 181 lb (82.1 kg)   BMI 27.52 kg/m   GENERAL:     He is averagely built and nourished HEENT:         Eye exam shows normal external appearance. Fundus exam shows no retinopathy.  Oral exam shows normal mucosa .  NECK:   There is no lymphadenopathy  Thyroid is  not enlarged and no nodules felt.  Carotids are normal to palpation and no bruit heard LUNGS:         Chest is symmetrical. Lungs are clear to auscultation.Marland Kitchen   HEART:         Heart sounds:  S1 and S2 are normal. He has a 3/6 pansystolic murmur at the apex No S3 or S4.   ABDOMEN:   There is no distention present. Liver and spleen are not palpable. No other mass or tenderness present.  He has a colostomy bag in place  NEUROLOGICAL:   Ankle jerks are absent bilaterally.    Diabetic Foot Exam - Simple   Simple Foot Form Diabetic Foot exam was performed with the following findings:  Yes   Visual Inspection No deformities, no ulcerations, no other skin breakdown bilaterally:  Yes Sensation Testing Intact to touch and monofilament testing bilaterally:  Yes Pulse Check See comments:  Yes Comments Absent pedal pulses            Vibration sense is Moderately reduced in distal first toes. MUSCULOSKELETAL:  There is no swelling or deformity of the peripheral joints.  Spine is normal to inspection.   EXTREMITIES:  There is no edema.   SKIN:   He has mild diffuse erythema with mild warmth on the left lower leg especially medially He has general dryness of his skin especially on the distal toes with some scaliness    ASSESSMENT:  Diabetes type 2, uncontrolled    He has had symptomatic hyperglycemia with A1c around 11% for almost a year now Currently on no treatment including insulin which he had been taking before although appears to not have been on an optimal regimen at any time Previously has been insulin-dependent He appears to be losing weight with his hyperglycemia and also has significant fatigue and increased thirst  Complications of diabetes: Mild neuropathy, unknown status of nephropathy, macrovascular disease including peripheral vascular disease  History of cardiomyopathy and CAD  History of chronic kidney disease of unclear etiology  PLAN:    He will need to be treated  with a basal bolus insulin regimen because of his insulin deficiency and symptomatic hyperglycemia  He appears to have coverage for [place] and NovoLog  Discussed in detail the differences between basal and bolus insulins  He was started on 14 units of TRESIBA once a day.  Given him a flowsheet to adjust the dose every 4 days by 2 units to get morning sugars down in the 90-130 range.  Given patient information packet on Tresiba  Discussed in detail the need for mealtime insulin, postprandial blood sugar control and timing and action of mealtime insulin such as NovoLog.  He was started 5 units with each meal and adjust based on his blood sugar patterns  He was given a instruction sheet on mealtime insulin to increase the dose by 2 units if postprandial readings are consistently over 180  He may need different doses based on his meal size and will need to have more education done by nurse educator  Consultation with dietitian also would be desirable  Encouraged him to start being more active and he can with walking  Recommended annual eye exams  Patient Instructions   TRESIBA insulin:  This insulin provides blood sugar control for up to 24 hours.    Start with 14 units IN PM daily and increase by 2 units every 4 days until the waking up sugars are under 130. Then continue the same dose.  If blood sugar is under 90 for 2 days in a row, reduce the dose by 2 units.  Note that this insulin does not control the rise of blood sugar with meals    Novolog 5 with each meal as shown      Counseling time on subjects discussed above is over 50% of today's 60 minute visit   Omaya Nieland 03/23/2017, 9:00 PM   Note: This office note was prepared with Insurance underwriter. Any transcriptional errors that result from this process are unintentional.

## 2017-03-23 NOTE — Patient Instructions (Addendum)
TRESIBA insulin:  This insulin provides blood sugar control for up to 24 hours.    Start with 14 units IN PM daily and increase by 2 units every 4 days until the waking up sugars are under 130. Then continue the same dose.  If blood sugar is under 90 for 2 days in a row, reduce the dose by 2 units.  Note that this insulin does not control the rise of blood sugar with meals    Novolog 5 with each meal as shown

## 2017-03-25 ENCOUNTER — Telehealth: Payer: Self-pay | Admitting: Endocrinology

## 2017-03-25 ENCOUNTER — Other Ambulatory Visit: Payer: Self-pay

## 2017-03-25 MED ORDER — INSULIN DEGLUDEC 100 UNIT/ML ~~LOC~~ SOPN: PEN_INJECTOR | SUBCUTANEOUS | 1 refills | Status: DC

## 2017-03-25 NOTE — Telephone Encounter (Signed)
[facility] needs more info on the tresiba rx before insurance will pay

## 2017-03-25 NOTE — Telephone Encounter (Signed)
Ordered this again

## 2017-04-01 ENCOUNTER — Telehealth: Payer: Self-pay | Admitting: Endocrinology

## 2017-04-01 NOTE — Telephone Encounter (Signed)
PA paperwork has been given to the doctor for review

## 2017-04-01 NOTE — Telephone Encounter (Signed)
Patient stated insurance will not cover the insulin degludec (TRESIBA FLEXTOUCH) 100 UNIT/ML SOPN FlexTouch Pen  Please advise

## 2017-04-05 ENCOUNTER — Other Ambulatory Visit: Payer: Self-pay

## 2017-04-05 MED ORDER — METOLAZONE 2.5 MG PO TABS: ORAL_TABLET | ORAL | 0 refills | Status: DC

## 2017-04-06 ENCOUNTER — Ambulatory Visit (HOSPITAL_COMMUNITY)
Admission: RE | Admit: 2017-04-06 | Discharge: 2017-04-06 | Disposition: A | Discharge status: Home / Self Care | Payer: Medicare Other | Source: Ambulatory Visit | Attending: Internal Medicine | Admitting: Internal Medicine

## 2017-04-06 VITALS — BP 104/60 | HR 71 | Wt 182.6 lb

## 2017-04-06 DIAGNOSIS — I5022 Chronic systolic (congestive) heart failure: Secondary | ICD-10-CM | POA: Insufficient documentation

## 2017-04-06 LAB — COMPREHENSIVE METABOLIC PANEL
ALBUMIN: 3.2 g/dL — AB (ref 3.5–5.0)
ALT: 14 U/L — ABNORMAL LOW (ref 17–63)
AST: 25 U/L (ref 15–41)
Alkaline Phosphatase: 95 U/L (ref 38–126)
Anion gap: 12 (ref 5–15)
BILIRUBIN TOTAL: 2.2 mg/dL — AB (ref 0.3–1.2)
BUN: 92 mg/dL — ABNORMAL HIGH (ref 6–20)
CO2: 27 mmol/L (ref 22–32)
Calcium: 9.6 mg/dL (ref 8.9–10.3)
Chloride: 93 mmol/L — ABNORMAL LOW (ref 101–111)
Creatinine, Ser: 2.91 mg/dL — ABNORMAL HIGH (ref 0.61–1.24)
GFR calc Af Amer: 25 mL/min — ABNORMAL LOW (ref >60)
GFR, EST NON AFRICAN AMERICAN: 22 mL/min — AB (ref >60)
Glucose, Bld: 263 mg/dL — ABNORMAL HIGH (ref 65–99)
POTASSIUM: 4.3 mmol/L (ref 3.5–5.1)
Sodium: 132 mmol/L — ABNORMAL LOW (ref 135–145)
TOTAL PROTEIN: 7.1 g/dL (ref 6.5–8.1)

## 2017-04-06 LAB — CBC WITH DIFFERENTIAL/PLATELET
BAND NEUTROPHILS: 0 %
BASOS ABS: 0 10*3/uL (ref 0.0–0.1)
Basophils Relative: 0 %
Blasts: 0 %
EOS ABS: 0.2 10*3/uL (ref 0.0–0.7)
Eosinophils Relative: 3 %
HCT: 38.7 % — ABNORMAL LOW (ref 39.0–52.0)
HEMOGLOBIN: 13 g/dL (ref 13.0–17.0)
LYMPHS ABS: 0.7 10*3/uL (ref 0.7–4.0)
LYMPHS PCT: 9 %
MCH: 30.6 pg (ref 26.0–34.0)
MCHC: 33.6 g/dL (ref 30.0–36.0)
MCV: 91.1 fL (ref 78.0–100.0)
METAMYELOCYTES PCT: 0 %
MONO ABS: 0.5 10*3/uL (ref 0.1–1.0)
MONOS PCT: 7 %
MYELOCYTES: 0 %
NEUTROS PCT: 81 %
NRBC: 0 /100{WBCs}
Neutro Abs: 6.1 10*3/uL (ref 1.7–7.7)
Other: 0 %
PLATELETS: 98 10*3/uL — AB (ref 150–400)
PROMYELOCYTES ABS: 0 %
RBC: 4.25 MIL/uL (ref 4.22–5.81)
RDW: 15.6 % — ABNORMAL HIGH (ref 11.5–15.5)
WBC: 7.5 10*3/uL (ref 4.0–10.5)

## 2017-04-06 LAB — APTT: aPTT: 37 seconds — ABNORMAL HIGH (ref 24–36)

## 2017-04-06 LAB — PROTIME-INR
INR: 2.23
PROTHROMBIN TIME: 25 s — AB (ref 11.4–15.2)

## 2017-04-06 NOTE — Patient Instructions (Signed)
Labs today (will call for abnormal results, otherwise no news is good news)  HOLD Eliquis until tomorrow night  STOP taking Fish Oil

## 2017-04-07 ENCOUNTER — Telehealth: Payer: Self-pay

## 2017-04-07 NOTE — Telephone Encounter (Signed)
Pt called office and canceled colonoscopy/egd that was scheduled with [facility] for 04/18/17. He said he had a colonoscopy last year in July and his insurance will not pay for another colonoscopy for one year so he wants to wail until July. Advised pt to call office and schedule office visit when he is ready to have colonoscopy done. Called and informed Endo scheduler.  Routing to AB as FYI.

## 2017-04-11 NOTE — Telephone Encounter (Signed)
Agree, thanks

## 2017-04-12 ENCOUNTER — Telehealth: Payer: Self-pay

## 2017-04-12 ENCOUNTER — Telehealth: Payer: Self-pay | Admitting: Endocrinology

## 2017-04-12 ENCOUNTER — Other Ambulatory Visit (HOSPITAL_COMMUNITY): Payer: Medicare Other

## 2017-04-12 NOTE — Telephone Encounter (Signed)
[name] is not covered. Could we try sending Lantus or Levemir?

## 2017-04-12 NOTE — Telephone Encounter (Signed)
Agree 

## 2017-04-12 NOTE — Telephone Encounter (Signed)
Documentation noted. Patient was told on multiple occasions to go to the ED.  I attempted to call patient myself. I left message that if he was unable to tolerate liquids, he needed emergent evaluation. If he is able to tolerate liquids, soft foods, then an appt tomorrow is appropriate. He was again instructed to seek medical care. I was unable to reach him and had to leave a message. As he has declined ED evaluation, this is his choice, and we will be seeing him tomorrow. I also informed on message that nothing urgently could be done in clinic.

## 2017-04-12 NOTE — Telephone Encounter (Signed)
Lantus ok, he needs to ask insurance too if Toujeo ok

## 2017-04-12 NOTE — Telephone Encounter (Signed)
PATIENT CALLED STATING HE DECIDED NOT TO GO TO THE ER AND HE WANTED TO BE SEEN HERE.  EXPLAINED TO PATIENT THAT IF HE COULD NOT SWALLOW HE NEEDED TO GO TO THE ER, ONCE AGAIN HE STATED HE WOULD TOLERATE IT BECAUSE HE WANTED TO COM TO THE OFFICE.  SPOKE TO ANNA AND HE IS IN AN URGENT APPT SLOT TOMORROW

## 2017-04-12 NOTE — Telephone Encounter (Signed)
Pt called in to check on the status of his insulin, he said that he has called several times without hearing anything back and would like to start back on his insulin as soon as possible.

## 2017-04-12 NOTE — Telephone Encounter (Signed)
Pt came by the office, he stated for the past 2 days he has been having problems swallowing. He said it felt like everything he eats gets stuck and he has to drink a lot of water to make it go down. Today he cant get water to go down, everytime he tries, it comes back up through his mouth and nose. Advised pt that he needs to go to the ED and be checked out to make sure it is not a food impaction. Pt stated he would go now.

## 2017-04-13 ENCOUNTER — Ambulatory Visit: Payer: Medicare Other | Admitting: Endocrinology

## 2017-04-13 ENCOUNTER — Telehealth: Payer: Self-pay

## 2017-04-13 ENCOUNTER — Other Ambulatory Visit: Payer: Self-pay

## 2017-04-13 ENCOUNTER — Encounter: Payer: Self-pay | Admitting: Gastroenterology

## 2017-04-13 ENCOUNTER — Ambulatory Visit (INDEPENDENT_AMBULATORY_CARE_PROVIDER_SITE_OTHER): Payer: Medicare Other | Admitting: Gastroenterology

## 2017-04-13 ENCOUNTER — Telehealth: Payer: Self-pay | Admitting: Gastroenterology

## 2017-04-13 DIAGNOSIS — R1310 Dysphagia, unspecified: Secondary | ICD-10-CM

## 2017-04-13 DIAGNOSIS — I255 Ischemic cardiomyopathy: Secondary | ICD-10-CM | POA: Diagnosis not present

## 2017-04-13 DIAGNOSIS — R1319 Other dysphagia: Secondary | ICD-10-CM

## 2017-04-13 DIAGNOSIS — R634 Abnormal weight loss: Secondary | ICD-10-CM

## 2017-04-13 MED ORDER — INSULIN GLARGINE 100 UNIT/ML SOLOSTAR PEN: 15.0000 [IU] | PEN_INJECTOR | Freq: Every day | SUBCUTANEOUS | 3 refills | Status: DC

## 2017-04-13 NOTE — Progress Notes (Addendum)
Primary Care Physician:  Redmond School, MD  Primary Gastroenterologist:  Garfield Cornea, MD   Chief Complaint  Patient presents with  . Dysphagia    gets stuck and comes back up, also comes out of nose    HPI:  [name] is a 61 y.o. male here for urgent office visit for dysphagia. Patient has a history of stage III rectal carcinoma, diagnosed 2008, status post APR/chemoradiation therapy. Last seen in the office in March. July 2017 he was [facility] for cholecystitis, possible choledocholithiasis. Underwent ERCP with dilated bile duct but no obvious stones, sphincterotomy and stent placement performed. Repeat ERCP February 2018 noted have common bile duct stones, extension of sphincterotomy and balloon extraction, stent removal. Patient not felt. Good elective cholecystectomy candidate.  It was noted that imaging back in 2017 suggested cirrhosis. Patient also with thrombocytopenia. At time of his last office visit there was plans for EGD for esophageal variceal screening and early interval follow-up colonoscopy. Patient had a colonoscopy September 2016 by Dr. Havery Moros in [place] with a diminutive polyp not manipulated due the presence of umbilicus and GI bleeding, stoma was friable and bleeding. Patient seems to think he had a colonoscopy with polyp removal last year while he was in hospital, we are trying to get his records.  He states he's had some dysphagia issues for a few months but progressively getting worse. Denies any heartburn on PPI. Solid foods feel like they're getting stuck in the esophagus. First few bites seem to go down okay but it seems like things are backing up. Yesterday he got a piece of chicken stuck and thought he was going to "choke to death". Sometimes he has water, there wasinstilled going down. Reports a 10 pound weight loss because he is not able to eat. Constantly feel like mucus coming back up into his throat. Feels like he has a giant air pocket in  his lower esophagus that keeps food from going down. Stool output normal and his ostomy. Less stoma bleeding since he came off his fish oil. No melena.   Recent ruq u/s 02/2017 for hepatoma surveillance. Slightly heterogeneous increased echogenicity of the liver may reflect changes of patient's cirrhosis without focal hepatic lesion or intrahepatic biliary duct dilation.   Current Outpatient Prescriptions  Medication Sig Dispense Refill  . apixaban (ELIQUIS) 5 MG TABS tablet Take 1 tablet (5 mg total) by mouth 2 (two) times daily. 60 tablet 6  . atorvastatin (LIPITOR) 40 MG tablet Take 1 tablet (40 mg total) by mouth daily. 30 tablet 6  . glucose blood test strip Use to test blood sugar 3 times daily 100 each 4  . insulin aspart (NOVOLOG FLEXPEN) 100 UNIT/ML FlexPen 5 units before each meal 15 mL 1  . Insulin Glargine (LANTUS SOLOSTAR) 100 UNIT/ML Solostar Pen Inject 15 Units into the skin daily at 10 pm. 5 pen 3  . levothyroxine (SYNTHROID, LEVOTHROID) 25 MCG tablet Take 25 mcg by mouth daily before breakfast.    . metolazone (ZAROXOLYN) 2.5 MG tablet take 1 tablet by mouth weekly if needed for SWELLING 12 tablet 0  . metoprolol succinate (TOPROL-XL) 25 MG 24 hr tablet take 1/2 tablet by mouth twice a day 45 tablet 3  . nitroGLYCERIN (NITROLINGUAL) 0.4 MG/SPRAY spray Place 1 spray under the tongue every 5 (five) minutes x 3 doses as needed for chest pain. Reported on 06/01/2016    . pantoprazole (PROTONIX) 40 MG tablet Take 1 tablet (40 mg total) by mouth daily.  30 tablet 0  . potassium chloride SA (K-DUR,KLOR-CON) 20 MEQ tablet Take 20 mEq by mouth daily.    Marland Kitchen PROAIR RESPICLICK 756 (90 BASE) MCG/ACT AEPB Inhale 2 puffs into the lungs every 6 (six) hours as needed (for breathing).   0  . torsemide (DEMADEX) 20 MG tablet Take 80 mg (4 tabs) in am and 40 mg (2 tabs) in pm 180 tablet 6   No current facility-administered medications for this visit.     Allergies as of 04/13/2017  . (No Known  Allergies)    Past Medical History:  Diagnosis Date  . Adenocarcinoma of rectum ([place])    a. 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 Anemia   . CAD (coronary artery disease)    a. BMS [facility] LAD 2001 [facility] b. PTCA/atherectomy ramus and BMS to LAD 2009  . CHF (congestive heart failure) ([place])   . Cholelithiasis 06/2015  . Chronic kidney disease, stage IV (severe) ([place])   . Chronic systolic heart failure ([place])   . Colostomy in place [facility])   . Dizziness    a. chronic. Admission for this 07/18/2014  . DM type 2, uncontrolled, with renal complications ([place])   . Essential hypertension, benign   . GERD (gastroesophageal reflux disease)   . HCAP (healthcare-associated pneumonia) 07/21/2015  . Hematuria   . History of blood transfusion    "I've had 2 units so far this year" (09/27/2015)  . HLD (hyperlipidemia)   . Ischemic cardiomyopathy    EF 18% by nuclear study 2016, multiple myocardial infarctions in past    . Myocardial infarction ([place]) 2001  . Obesity   . Orthostatic hypotension   . OSA on CPAP   . Paroxysmal atrial fibrillation ([facility])    a. on amiodarone, digoxin and Eliquis  . PONV (postoperative nausea and vomiting)   . Prostate cancer ([place])    a. s/p seed implants with chemo and radiation  . SAH (subarachnoid hemorrhage) ([place])    post-traumatic (fall) [facility] 12/2014  . TIA (transient ischemic attack)     Past Surgical History:  Procedure Laterality Date  . Abdominal and Perineal Resection of Rectum with Total Mesorectal Excision  10/04/2007  . AV FISTULA PLACEMENT Right 09/16/2015   Procedure: ARTERIOVENOUS (AV) FISTULA CREATION - BRACHIOCEPHALIC;  Surgeon: Elam Dutch, MD;  Location: [facility];  Service: Vascular;  Laterality: Right;  . BI-VENTRICULAR IMPLANTABLE CARDIOVERTER DEFIBRILLATOR  (CRT-D)  2009  . CARDIAC CATHETERIZATION  08/2001; 2009   ; [name] 07/10/2013  . CARDIAC DEFIBRILLATOR PLACEMENT  2002  . CARDIAC DEFIBRILLATOR PLACEMENT  2009   Upgraded to a BiV ICD  . COLONOSCOPY  09/14/2011    Dr. Gala Romney: via colostomy, Single pedunculated benign inflammatory polyp. Due for surveillance Oct 2015  . COLONOSCOPY N/A 07/02/2014   Procedure: COLONOSCOPY;  Surgeon: Daneil Dolin, MD;  [facility];  Service: Endoscopy;  Laterality: N/A;  7:30 / COLONOSCOPY THRU COLOSTOMY  . COLONOSCOPY N/A 12/11/2014   Dr. Gala Romney via colostomy. Normal. Repeat in 2021.   Marland Kitchen COLONOSCOPY N/A 08/24/2015   Dr. Havery Moros: diminutive polyp not removed, stoma site of bleeding  . COLOSTOMY  2008  . CORONARY ANGIOPLASTY WITH STENT PLACEMENT  2001; ~ 2006   "1 + 1"   . ELECTROPHYSIOLOGIC STUDY N/A 08/28/2015   Procedure: AV Node Ablation;  Surgeon: Will Meredith Leeds, MD;  Location: [place] CV LAB;  Service: Cardiovascular;  Laterality: N/A;  . EP IMPLANTABLE DEVICE N/A 04/10/2015   Procedure: Ppm/Biv Ppm Generator Changeout;  Surgeon: Evans Lance, MD;  Location: [facility] INVASIVE CV LAB CUPID;  Service: Cardiovascular;  Laterality: N/A;  . ERCP N/A 06/11/2016   Procedure: ENDOSCOPIC RETROGRADE CHOLANGIOPANCREATOGRAPHY (ERCP);  Surgeon: Teena Irani, MD;  Location: [facility] ENDOSCOPY;  Service: Endoscopy;  Laterality: N/A;  . ERCP N/A 01/26/2017   Procedure: ENDOSCOPIC RETROGRADE CHOLANGIOPANCREATOGRAPHY (ERCP);  Surgeon: Teena Irani, MD;  Location: [facility] ENDOSCOPY;  Service: Endoscopy;  Laterality: N/A;  . ERCP  06/2016   Dr. Amedeo Plenty: duodenal fistula, dilated bile duct but no obvious stones, s/p sphincterotomy and stent placement  . ERCP  01/2017   Dr. Amedeo Plenty: CBD stones, s/p sphincterotomy, balloon extraction, stent removal  . ESOPHAGOGASTRODUODENOSCOPY N/A 07/02/2014   Procedure: ESOPHAGOGASTRODUODENOSCOPY (EGD);  Surgeon: Daneil Dolin, MD;  [facility];  Service: Endoscopy;  Laterality: N/A;  7:30  . ESOPHAGOGASTRODUODENOSCOPY N/A 12/11/2014   BWG:[id number] EGD  . GASTROINTESTINAL STENT REMOVAL N/A 01/26/2017   Procedure: GASTROINTESTINAL STENT REMOVAL;  Surgeon: Teena Irani, MD;  Location: [place];   Service: Endoscopy;  Laterality: N/A;  . GIVENS CAPSULE STUDY N/A 07/23/2015   Dr. Michail Sermon: small non-bleeding AVM, mild gastritis  . LEFT HEART CATHETERIZATION WITH CORONARY ANGIOGRAM N/A 07/13/2013   Procedure: LEFT HEART CATHETERIZATION WITH CORONARY ANGIOGRAM;  Surgeon: Lorretta Harp, MD;  Location: [facility] CATH LAB;  Service: Cardiovascular;  Laterality: N/A;  . Venia Minks DILATION N/A 07/02/2014   Procedure: Venia Minks DILATION;  Surgeon: Daneil Dolin, MD;  [facility];  Service: Endoscopy;  Laterality: N/A;  7:30  . PORTACATH PLACEMENT  06/2007   "removed ~ 1 yr later"  . RIGHT HEART CATHETERIZATION N/A 02/24/2015   Procedure: RIGHT HEART CATH;  Surgeon: Jolaine Artist, MD;  Location: [facility] CATH LAB;  Service: Cardiovascular;  Laterality: N/A;  . SAVORY DILATION N/A 07/02/2014   Procedure: SAVORY DILATION;  Surgeon: Daneil Dolin, MD;  [facility];  Service: Endoscopy;  Laterality: N/A;  7:30    Family History  Problem Relation Age of Onset  . Colon cancer Mother 64  . Coronary artery disease Father   . Colon cancer Sister 17  . Diabetes Sister   . Colon cancer Other     2 cousins, succumbed to illness    Social History   Social History  . Marital status: Married    Spouse name: N/A  . Number of children: 1  . Years of education: N/A   Occupational History  .  Unemployed   Social History Main Topics  . Smoking status: Never Smoker  . Smokeless tobacco: Never Used  . Alcohol use No     Comment: Former user 45 years ago  . Drug use: No  . Sexual activity: No   Other Topics Concern  . Not on file   Social History Narrative   Married 34 years   1 son, 2 grandkids    Denies alcohol, drugs, tobacco       ROS:  General: Negative for anorexia,   fever, chills, fatigue, +weakness.+weight loss Eyes: Negative for vision changes.  ENT: Negative for hoarseness,   nasal congestion. See hpi CV: Negative for chest pain, angina, palpitations, dyspnea on  exertion, peripheral edema.  Respiratory: Negative for dyspnea at rest, dyspnea on exertion, cough, sputum, wheezing.  GI: See history of present illness. GU:  Negative for dysuria, hematuria, urinary incontinence, urinary frequency, nocturnal urination.  MS: Negative for joint pain, low back pain.  Derm: Negative for rash or itching.  Neuro: Negative for weakness,  abnormal sensation, seizure, frequent headaches, memory loss, confusion.  Psych: Negative for anxiety, depression, suicidal ideation, hallucinations.  Endo: see hpi Heme: Negative for bruising or bleeding. Allergy: Negative for rash or hives.    Physical Examination:  BP 107/71   Pulse 84   Temp (!) 96.6 F (35.9 C) (Oral)   Ht 5\' 10"  (1.778 m)   Wt 175 lb (79.4 kg)   BMI 25.11 kg/m    General: chronically ill-appearing WM, NAD.   Head: Normocephalic, atraumatic.   Eyes: Conjunctiva pink, no icterus. Mouth: Oropharyngeal mucosa moist and pink , no lesions erythema or exudate. Neck: Supple without thyromegaly, masses, or lymphadenopathy.  Lungs: Clear to auscultation bilaterally.  Heart: Regular rate and rhythm, no murmurs rubs or gallops.  Abdomen: Bowel sounds are normal, nontender, nondistended, no hepatosplenomegaly or masses, no abdominal bruits or    hernia , no rebound or guarding.  Ostomy in llq Rectal: not performed Extremities: No lower extremity edema. No clubbing or deformities.  Neuro: Alert and oriented x 4 , grossly normal neurologically.  Skin: Warm and dry, no rash or jaundice.  +bruising Psych: Alert and cooperative, normal mood and affect.  Labs: Lab Results  Component Value Date   INR 2.23 04/06/2017   INR 1.4 09/20/2016   INR 2.2 09/06/2016   Lab Results  Component Value Date   CREATININE 2.91 (H) 04/06/2017   BUN 92 (H) 04/06/2017   NA 132 (L) 04/06/2017   K 4.3 04/06/2017   CL 93 (L) 04/06/2017   CO2 27 04/06/2017   Lab Results  Component Value Date   ALT 14 (L) 04/06/2017    AST 25 04/06/2017   ALKPHOS 95 04/06/2017   BILITOT 2.2 (H) 04/06/2017   Lab Results  Component Value Date   WBC 7.5 04/06/2017   HGB 13.0 04/06/2017   HCT 38.7 (L) 04/06/2017   MCV 91.1 04/06/2017   PLT 98 (L) 04/06/2017     Imaging Studies: No results found.

## 2017-04-13 NOTE — Patient Instructions (Addendum)
1. Upper endoscopy as scheduled. I am going to have you scheduled for possible esophageal stretching, and possible banding if you have blood vessel at your esophagus (although I don't think Dr. Gala Romney will pursue this right now with your swallowing issues).  2. You will need to hold your Eliquis for 48 hours before your procedure.  3. Please stick with SOFT foods until your procedure. IF YOUR SYMPTOMS WORSEN, GO TO THE ER.

## 2017-04-13 NOTE — Progress Notes (Signed)
cc'ed to pcp °

## 2017-04-13 NOTE — Assessment & Plan Note (Signed)
61 year old gentleman with significant heart disease including CHF, biventricular implantable cardioverter defibrillator, chronic Eliquis who presents with progressive esophageal dysphagia. Symptoms worsening over the last couple of months. Over the past week he's having increasing difficulty swallowing solid food. Yesterday chicken became lodged in his esophagus. At times liquids come out of his nose instead of going down. Dysphagia may be multifactorial but majority of symptoms most consistent with esophageal dysphagia. Discussed with Dr. Gala Romney. Plan for EGD with dilation with deep sedation in the OR in the near future.  I have discussed the risks, alternatives, benefits with regards to but not limited to the risk of reaction to medication, bleeding, infection, perforation and the patient is agreeable to proceed. Written consent to be obtained.  Hold Eliquis 48 hours before the procedure.   Advised to go to er if symptoms worsen.

## 2017-04-13 NOTE — Telephone Encounter (Signed)
Rx for Lantus submitted. Patient will call insurance to verify if the toujeo is covered.

## 2017-04-13 NOTE — Telephone Encounter (Signed)
Tried to call pt to inform of pre-op appt 04/14/17 at 7:00am, LMOVM and informed.

## 2017-04-13 NOTE — Telephone Encounter (Signed)
Dr. Haroldine Laws, we are planning on esophagogastroduodenoscopy with possible esophageal dilation for progressive dysphagia. We need to hold his Eliquis for the dilation. We generally hold at least 48 hours for dilation unless you recommend 72 hours given his elevated creatinine clearance. Thank you in advance for your advice.

## 2017-04-14 ENCOUNTER — Encounter (HOSPITAL_COMMUNITY): Payer: Self-pay

## 2017-04-14 ENCOUNTER — Other Ambulatory Visit: Payer: Self-pay

## 2017-04-14 ENCOUNTER — Encounter (HOSPITAL_COMMUNITY)
Admission: RE | Admit: 2017-04-14 | Discharge: 2017-04-14 | Disposition: A | Discharge status: Home / Self Care | Payer: Medicare Other | Source: Ambulatory Visit | Attending: Internal Medicine | Admitting: Internal Medicine

## 2017-04-14 DIAGNOSIS — Z01812 Encounter for preprocedural laboratory examination: Secondary | ICD-10-CM | POA: Diagnosis not present

## 2017-04-14 DIAGNOSIS — R1310 Dysphagia, unspecified: Secondary | ICD-10-CM | POA: Diagnosis not present

## 2017-04-14 DIAGNOSIS — R634 Abnormal weight loss: Secondary | ICD-10-CM | POA: Insufficient documentation

## 2017-04-14 DIAGNOSIS — Z6825 Body mass index (BMI) 25.0-25.9, adult: Secondary | ICD-10-CM | POA: Diagnosis not present

## 2017-04-14 HISTORY — DX: Cardiac arrhythmia, unspecified: I49.9

## 2017-04-14 LAB — CBC
HEMATOCRIT: 39.7 % (ref 39.0–52.0)
HEMOGLOBIN: 13.2 g/dL (ref 13.0–17.0)
MCH: 31.1 pg (ref 26.0–34.0)
MCHC: 33.2 g/dL (ref 30.0–36.0)
MCV: 93.6 fL (ref 78.0–100.0)
Platelets: 110 10*3/uL — ABNORMAL LOW (ref 150–400)
RBC: 4.24 MIL/uL (ref 4.22–5.81)
RDW: 16 % — ABNORMAL HIGH (ref 11.5–15.5)
WBC: 5.6 10*3/uL (ref 4.0–10.5)

## 2017-04-14 LAB — BASIC METABOLIC PANEL
ANION GAP: 12 (ref 5–15)
BUN: 67 mg/dL — ABNORMAL HIGH (ref 6–20)
CALCIUM: 9.3 mg/dL (ref 8.9–10.3)
CHLORIDE: 92 mmol/L — AB (ref 101–111)
CO2: 32 mmol/L (ref 22–32)
Creatinine, Ser: 2.52 mg/dL — ABNORMAL HIGH (ref 0.61–1.24)
GFR calc non Af Amer: 26 mL/min — ABNORMAL LOW (ref >60)
GFR, EST AFRICAN AMERICAN: 30 mL/min — AB (ref >60)
Glucose, Bld: 245 mg/dL — ABNORMAL HIGH (ref 65–99)
POTASSIUM: 3.1 mmol/L — AB (ref 3.5–5.1)
Sodium: 136 mmol/L (ref 135–145)

## 2017-04-14 NOTE — Progress Notes (Signed)
Please let patient know his sugar is 245. Creatinine stable 2.52. Platelets stable. His potassium is slightly low, looks like he is on 71meq once daily. Let's have him increase to bid for 3 days. Let me know if he needs new rx or if his current dose is different.

## 2017-04-14 NOTE — Pre-Procedure Instructions (Signed)
Reviewed extensive medical history with Dr Gala Romney and Dr Patsey Berthold. Both feel that due to very low probability of use of cautery for this procedure, as well as his extensive history, they agree that patient's defibrillator does not need to be turned off for procedure.

## 2017-04-14 NOTE — Patient Instructions (Signed)
[name]  04/14/2017     @PREFPERIOPPHARMACY @   Your procedure is scheduled on 04/18/2017.  Report to [facility] at 1300 P.M.  Call this number if you have problems the morning of surgery:  [phone]   Remember:  Do not eat food or drink liquids after midnight.  Take these medicines the morning of surgery with A SIP OF WATER Synthroid, Toprol, ProAir, Protonix  HOLD ELIQUIS 48 HOURS PRIOR TO PROCEDURE   Do not wear jewelry, make-up or nail polish.  Do not wear lotions, powders, or perfumes, or deoderant.  Do not shave 48 hours prior to surgery.  Men may shave face and neck.  Do not bring valuables to the hospital.  [facility] is not responsible for any belongings or valuables.  Contacts, dentures or bridgework may not be worn into surgery.  Leave your suitcase in the car.  After surgery it may be brought to your room.  For patients admitted to the hospital, discharge time will be determined by your treatment team.  Patients discharged the day of surgery will not be allowed to drive home.    Please read over the following fact sheets that you were given. Anesthesia Post-op Instructions     PATIENT INSTRUCTIONS POST-ANESTHESIA  IMMEDIATELY FOLLOWING SURGERY:  Do not drive or operate machinery for the first twenty four hours after surgery.  Do not make any important decisions for twenty four hours after surgery or while taking narcotic pain medications or sedatives.  If you develop intractable nausea and vomiting or a severe headache please notify your doctor immediately.  FOLLOW-UP:  Please make an appointment with your surgeon as instructed. You do not need to follow up with anesthesia unless specifically instructed to do so.  WOUND CARE INSTRUCTIONS (if applicable):  Keep a dry clean dressing on the anesthesia/puncture wound site if there is drainage.  Once the wound has quit draining you may leave it open to air.  Generally you should leave the bandage intact for  twenty four hours unless there is drainage.  If the epidural site drains for more than 36-48 hours please call the anesthesia department.  QUESTIONS?:  Please feel free to call your physician or the hospital operator if you have any questions, and they will be happy to assist you.      Esophageal Dilatation Esophageal dilatation is a procedure to open a blocked or narrowed part of the esophagus. The esophagus is the long tube in your throat that carries food and liquid from your mouth to your stomach. The procedure is also called esophageal dilation. You may need this procedure if you have a buildup of scar tissue in your esophagus that makes it difficult, painful, or even impossible to swallow. This can be caused by gastroesophageal reflux disease (GERD). In rare cases, people need this procedure because they have cancer of the esophagus or a problem with the way food moves through the esophagus. Sometimes you may need to have another dilatation to enlarge the opening of the esophagus gradually. Tell a health care provider about:  Any allergies you have.  All medicines you are taking, including vitamins, herbs, eye drops, creams, and over-the-counter medicines.  Any problems you or family members have had with anesthetic medicines.  Any blood disorders you have.  Any surgeries you have had.  Any medical conditions you have.  Any antibiotic medicines you are required to take before dental procedures. What are the risks? Generally, this is a safe procedure. However,  problems can occur and include:  Bleeding from a tear in the lining of the esophagus.  A hole (perforation) in the esophagus. What happens before the procedure?  Do not eat or drink anything after midnight on the night before the procedure or as directed by your health care provider.  Ask your health care provider about changing or stopping your regular medicines. This is especially important if you are taking diabetes  medicines or blood thinners.  Plan to have someone take you home after the procedure. What happens during the procedure?  You will be given a medicine that makes you relaxed and sleepy (sedative).  A medicine may be sprayed or gargled to numb the back of the throat.  Your health care provider can use various instruments to do an esophageal dilatation. During the procedure, the instrument used will be placed in your mouth and passed down into your esophagus. Options include:  Simple dilators. This instrument is carefully placed in the esophagus to stretch it.  Guided wire bougies. In this method, a flexible tube (endoscope) is used to insert a wire into the esophagus. The dilator is passed over this wire to enlarge the esophagus. Then the wire is removed.  Balloon dilators. An endoscope with a small balloon at the end is passed down into the esophagus. Inflating the balloon gently stretches the esophagus and opens it up. What happens after the procedure?  Your blood pressure, heart rate, breathing rate, and blood oxygen level will be monitored often until the medicines you were given have worn off.  Your throat may feel slightly sore and will probably still feel numb. This will improve slowly over time.  You will not be allowed to eat or drink until the throat numbness has resolved.  If this is a same-day procedure, you may be allowed to go home once you have been able to drink, urinate, and sit on the edge of the bed without nausea or dizziness.  If this is a same-day procedure, you should have a friend or family member with you for the next 24 hours after the procedure. This information is not intended to replace advice given to you by your health care provider. Make sure you discuss any questions you have with your health care provider. Document Released: 01/13/2006 Document Revised: 04/29/2016 Document Reviewed: 04/03/2014 Elsevier Interactive Patient Education  2017 [place]. Esophagogastroduodenoscopy Esophagogastroduodenoscopy (EGD) is a procedure to examine the lining of the esophagus, stomach, and first part of the small intestine (duodenum). This procedure is done to check for problems such as inflammation, bleeding, ulcers, or growths. During this procedure, a long, flexible, lighted tube with a camera attached (endoscope) is inserted down the throat. Tell a health care provider about:  Any allergies you have.  All medicines you are taking, including vitamins, herbs, eye drops, creams, and over-the-counter medicines.  Any problems you or family members have had with anesthetic medicines.  Any blood disorders you have.  Any surgeries you have had.  Any medical conditions you have.  Whether you are pregnant or may be pregnant. What are the risks? Generally, this is a safe procedure. However, problems may occur, including:  Infection.  Bleeding.  A tear (perforation) in the esophagus, stomach, or duodenum.  Trouble breathing.  Excessive sweating.  Spasms of the larynx.  A slowed heartbeat.  Low blood pressure. What happens before the procedure?  Follow instructions from your health care provider about eating or drinking restrictions.  Ask your health care provider about:  Changing or stopping your regular medicines. This is especially important if you are taking diabetes medicines or blood thinners.  Taking medicines such as aspirin and ibuprofen. These medicines can thin your blood. Do not take these medicines before your procedure if your health care provider instructs you not to.  Plan to have someone take you home after the procedure.  If you wear dentures, be ready to remove them before the procedure. What happens during the procedure?  To reduce your risk of infection, your health care team will wash or sanitize their hands.  An IV tube will be put in a vein in your hand or arm. You will get medicines and fluids through  this tube.  You will be given one or more of the following:  A medicine to help you relax (sedative).  A medicine to numb the area (local anesthetic). This medicine may be sprayed into your throat. It will make you feel more comfortable and keep you from gagging or coughing during the procedure.  A medicine for pain.  A mouth guard may be placed in your mouth to protect your teeth and to keep you from biting on the endoscope.  You will be asked to lie on your left side.  The endoscope will be lowered down your throat into your esophagus, stomach, and duodenum.  Air will be put into the endoscope. This will help your health care provider see better.  The lining of your esophagus, stomach, and duodenum will be examined.  Your health care provider may:  Take a tissue sample so it can be looked at in a lab (biopsy).  Remove growths.  Remove objects (foreign bodies) that are stuck.  Treat any bleeding with medicines or other devices that stop tissue from bleeding.  Widen (dilate) or stretch narrowed areas of your esophagus and stomach.  The endoscope will be taken out. The procedure may vary among health care providers and hospitals. What happens after the procedure?  Your blood pressure, heart rate, breathing rate, and blood oxygen level will be monitored often until the medicines you were given have worn off.  Do not eat or drink anything until the numbing medicine has worn off and your gag reflex has returned. This information is not intended to replace advice given to you by your health care provider. Make sure you discuss any questions you have with your health care provider. Document Released: 03/25/2005 Document Revised: 04/29/2016 Document Reviewed: 10/16/2015 Elsevier Interactive Patient Education  2017 [employer].

## 2017-04-15 NOTE — Telephone Encounter (Signed)
Patient will receive no Eliquis on 5/12, 5/13. Procedure 5/14. Will be >48 but less than 72 hours off Eliquis. To resume as soon as possible after EGD.

## 2017-04-18 ENCOUNTER — Encounter (HOSPITAL_COMMUNITY): Admission: RE | Payer: Self-pay | Source: Ambulatory Visit

## 2017-04-18 ENCOUNTER — Encounter (HOSPITAL_COMMUNITY)
Admission: RE | Disposition: A | Discharge status: Home / Self Care | Payer: Self-pay | Source: Ambulatory Visit | Attending: Internal Medicine

## 2017-04-18 ENCOUNTER — Ambulatory Visit (HOSPITAL_COMMUNITY): Admission: RE | Admit: 2017-04-18 | Payer: Medicare Other | Source: Ambulatory Visit | Admitting: Internal Medicine

## 2017-04-18 ENCOUNTER — Ambulatory Visit (HOSPITAL_COMMUNITY)
Admission: RE | Admit: 2017-04-18 | Discharge: 2017-04-18 | Disposition: A | Discharge status: Home / Self Care | Payer: Medicare Other | Source: Ambulatory Visit | Attending: Internal Medicine | Admitting: Internal Medicine

## 2017-04-18 ENCOUNTER — Ambulatory Visit (HOSPITAL_COMMUNITY): Payer: Medicare Other | Admitting: Anesthesiology

## 2017-04-18 ENCOUNTER — Telehealth: Payer: Self-pay | Admitting: Endocrinology

## 2017-04-18 DIAGNOSIS — I2510 Atherosclerotic heart disease of native coronary artery without angina pectoris: Secondary | ICD-10-CM | POA: Insufficient documentation

## 2017-04-18 DIAGNOSIS — I252 Old myocardial infarction: Secondary | ICD-10-CM | POA: Insufficient documentation

## 2017-04-18 DIAGNOSIS — Z933 Colostomy status: Secondary | ICD-10-CM | POA: Diagnosis not present

## 2017-04-18 DIAGNOSIS — I5022 Chronic systolic (congestive) heart failure: Secondary | ICD-10-CM | POA: Insufficient documentation

## 2017-04-18 DIAGNOSIS — R1310 Dysphagia, unspecified: Secondary | ICD-10-CM

## 2017-04-18 DIAGNOSIS — Z8546 Personal history of malignant neoplasm of prostate: Secondary | ICD-10-CM | POA: Diagnosis not present

## 2017-04-18 DIAGNOSIS — Z923 Personal history of irradiation: Secondary | ICD-10-CM | POA: Insufficient documentation

## 2017-04-18 DIAGNOSIS — E1122 Type 2 diabetes mellitus with diabetic chronic kidney disease: Secondary | ICD-10-CM | POA: Insufficient documentation

## 2017-04-18 DIAGNOSIS — Z9221 Personal history of antineoplastic chemotherapy: Secondary | ICD-10-CM | POA: Diagnosis not present

## 2017-04-18 DIAGNOSIS — I255 Ischemic cardiomyopathy: Secondary | ICD-10-CM | POA: Insufficient documentation

## 2017-04-18 DIAGNOSIS — Z85048 Personal history of other malignant neoplasm of rectum, rectosigmoid junction, and anus: Secondary | ICD-10-CM | POA: Insufficient documentation

## 2017-04-18 DIAGNOSIS — D696 Thrombocytopenia, unspecified: Secondary | ICD-10-CM | POA: Diagnosis not present

## 2017-04-18 DIAGNOSIS — Z79899 Other long term (current) drug therapy: Secondary | ICD-10-CM | POA: Insufficient documentation

## 2017-04-18 DIAGNOSIS — E1165 Type 2 diabetes mellitus with hyperglycemia: Secondary | ICD-10-CM | POA: Insufficient documentation

## 2017-04-18 DIAGNOSIS — R634 Abnormal weight loss: Secondary | ICD-10-CM | POA: Diagnosis not present

## 2017-04-18 DIAGNOSIS — I480 Paroxysmal atrial fibrillation: Secondary | ICD-10-CM | POA: Diagnosis not present

## 2017-04-18 DIAGNOSIS — N184 Chronic kidney disease, stage 4 (severe): Secondary | ICD-10-CM | POA: Insufficient documentation

## 2017-04-18 DIAGNOSIS — Z9049 Acquired absence of other specified parts of digestive tract: Secondary | ICD-10-CM | POA: Diagnosis not present

## 2017-04-18 DIAGNOSIS — K219 Gastro-esophageal reflux disease without esophagitis: Secondary | ICD-10-CM | POA: Insufficient documentation

## 2017-04-18 DIAGNOSIS — E785 Hyperlipidemia, unspecified: Secondary | ICD-10-CM | POA: Diagnosis not present

## 2017-04-18 DIAGNOSIS — K3189 Other diseases of stomach and duodenum: Secondary | ICD-10-CM | POA: Diagnosis not present

## 2017-04-18 DIAGNOSIS — Z8673 Personal history of transient ischemic attack (TIA), and cerebral infarction without residual deficits: Secondary | ICD-10-CM | POA: Insufficient documentation

## 2017-04-18 DIAGNOSIS — K766 Portal hypertension: Secondary | ICD-10-CM | POA: Diagnosis not present

## 2017-04-18 DIAGNOSIS — I130 Hypertensive heart and chronic kidney disease with heart failure and stage 1 through stage 4 chronic kidney disease, or unspecified chronic kidney disease: Secondary | ICD-10-CM | POA: Insufficient documentation

## 2017-04-18 DIAGNOSIS — Z794 Long term (current) use of insulin: Secondary | ICD-10-CM | POA: Insufficient documentation

## 2017-04-18 DIAGNOSIS — G4733 Obstructive sleep apnea (adult) (pediatric): Secondary | ICD-10-CM | POA: Diagnosis not present

## 2017-04-18 DIAGNOSIS — Z7901 Long term (current) use of anticoagulants: Secondary | ICD-10-CM | POA: Insufficient documentation

## 2017-04-18 HISTORY — PX: MALONEY DILATION: SHX5535

## 2017-04-18 HISTORY — PX: ESOPHAGOGASTRODUODENOSCOPY (EGD) WITH PROPOFOL: SHX5813

## 2017-04-18 LAB — GLUCOSE, CAPILLARY: GLUCOSE-CAPILLARY: 143 mg/dL — AB (ref 65–99)

## 2017-04-18 SURGERY — ESOPHAGOGASTRODUODENOSCOPY (EGD) WITH PROPOFOL
Anesthesia: Monitor Anesthesia Care

## 2017-04-18 SURGERY — COLONOSCOPY WITH PROPOFOL
Anesthesia: Monitor Anesthesia Care

## 2017-04-18 MED ORDER — LIDOCAINE VISCOUS 2 % MT SOLN
OROMUCOSAL | Status: DC | PRN
  Administered 2017-04-18: 1 via OROMUCOSAL

## 2017-04-18 MED ORDER — ONDANSETRON HCL 4 MG/2ML IJ SOLN
4.0000 mg | Freq: Once | INTRAMUSCULAR | Status: AC
  Administered 2017-04-18: 4 mg via INTRAVENOUS

## 2017-04-18 MED ORDER — FENTANYL CITRATE (PF) 100 MCG/2ML IJ SOLN
25.0000 ug | INTRAMUSCULAR | Status: DC | PRN
  Administered 2017-04-18: 25 ug via INTRAVENOUS

## 2017-04-18 MED ORDER — PROPOFOL 500 MG/50ML IV EMUL
INTRAVENOUS | Status: DC | PRN
  Administered 2017-04-18: 150 ug/kg/min via INTRAVENOUS

## 2017-04-18 MED ORDER — MIDAZOLAM HCL 2 MG/2ML IJ SOLN
1.0000 mg | INTRAMUSCULAR | Status: DC | PRN
  Administered 2017-04-18: 2 mg via INTRAVENOUS

## 2017-04-18 MED ORDER — LACTATED RINGERS IV SOLN
INTRAVENOUS | Status: DC
  Administered 2017-04-18: 10:00:00 via INTRAVENOUS

## 2017-04-18 NOTE — Anesthesia Postprocedure Evaluation (Signed)
Anesthesia Post Note  Patient: KARON HECKENDORN  Procedure(s) Performed: Procedure(s) (LRB): ESOPHAGOGASTRODUODENOSCOPY (EGD) WITH PROPOFOL (N/A) MALONEY DILATION (N/A)  Patient location during evaluation: PACU Anesthesia Type: MAC Level of consciousness: awake and alert Pain management: satisfactory to patient Vital Signs Assessment: post-procedure vital signs reviewed and stable Respiratory status: spontaneous breathing and patient connected to face mask oxygen Cardiovascular status: stable Anesthetic complications: no     Last Vitals:  Vitals:   04/18/17 1045 04/18/17 1100  BP: 111/74 106/70  Pulse: 70 70  Resp: 13 10  Temp:      Last Pain:  Vitals:   04/18/17 1036  PainSc: Asleep                 Colonel Krauser

## 2017-04-18 NOTE — H&P (View-Only) (Signed)
Primary Care Physician:  Redmond School, MD  Primary Gastroenterologist:  Garfield Cornea, MD   Chief Complaint  Patient presents with  . Dysphagia    gets stuck and comes back up, also comes out of nose    HPI:  [name] is a 61 y.o. male here for urgent office visit for dysphagia. Patient has a history of stage III rectal carcinoma, diagnosed 2008, status post APR/chemoradiation therapy. Last seen in the office in March. July 2017 [facility] for [facility]stitis, possible choledocholithiasis. Underwent ERCP with dilated bile duct but no obvious stones, sphincterotomy and stent placement performed. Repeat ERCP February 2018 noted have common bile duct stones, extension of sphincterotomy and balloon extraction, stent removal. Patient not felt. Good elective cholecystectomy candidate.  It was noted that imaging back in 2017 suggested cirrhosis. Patient also with thrombocytopenia. At time of his last office visit there was plans for EGD for esophageal variceal screening and early interval follow-up colonoscopy. Patient had a colonoscopy September 2016 by Dr. Havery Moros in [place] with a diminutive polyp not manipulated due the presence of umbilicus and GI bleeding, stoma was friable and bleeding. Patient seems to think he had a colonoscopy with polyp removal last year while he was in hospital, we are trying to get his records.  He states he's had some dysphagia issues for a few months but progressively getting worse. Denies any heartburn on PPI. Solid foods feel like they're getting stuck in the esophagus. First few bites seem to go down okay but it seems like things are backing up. Yesterday he got a piece of chicken stuck and thought he was going to "choke to death". Sometimes he has water, there wasinstilled going down. Reports a 10 pound weight loss because he is not able to eat. Constantly feel like mucus coming back up into his throat. Feels like he has a giant air pocket in  his lower esophagus that keeps food from going down. Stool output normal and his ostomy. Less stoma bleeding since he came off his fish oil. No melena.   Recent ruq u/s 02/2017 for hepatoma surveillance. Slightly heterogeneous increased echogenicity of the liver may reflect changes of patient's cirrhosis without focal hepatic lesion or intrahepatic biliary duct dilation.   Current Outpatient Prescriptions  Medication Sig Dispense Refill  . apixaban (ELIQUIS) 5 MG TABS tablet Take 1 tablet (5 mg total) by mouth 2 (two) times daily. 60 tablet 6  . atorvastatin (LIPITOR) 40 MG tablet Take 1 tablet (40 mg total) by mouth daily. 30 tablet 6  . glucose blood test strip Use to test blood sugar 3 times daily 100 each 4  . insulin aspart (NOVOLOG FLEXPEN) 100 UNIT/ML FlexPen 5 units before each meal 15 mL 1  . Insulin Glargine (LANTUS SOLOSTAR) 100 UNIT/ML Solostar Pen Inject 15 Units into the skin daily at 10 pm. 5 pen 3  . levothyroxine (SYNTHROID, LEVOTHROID) 25 MCG tablet Take 25 mcg by mouth daily before breakfast.    . metolazone (ZAROXOLYN) 2.5 MG tablet take 1 tablet by mouth weekly if needed for SWELLING 12 tablet 0  . metoprolol succinate (TOPROL-XL) 25 MG 24 hr tablet take 1/2 tablet by mouth twice a day 45 tablet 3  . nitroGLYCERIN (NITROLINGUAL) 0.4 MG/SPRAY spray Place 1 spray under the tongue every 5 (five) minutes x 3 doses as needed for chest pain. Reported on 06/01/2016    . pantoprazole (PROTONIX) 40 MG tablet Take 1 tablet (40 mg total) by mouth daily.  30 tablet 0  . potassium chloride SA (K-DUR,KLOR-CON) 20 MEQ tablet Take 20 mEq by mouth daily.    Marland Kitchen PROAIR RESPICLICK 448 (90 BASE) MCG/ACT AEPB Inhale 2 puffs into the lungs every 6 (six) hours as needed (for breathing).   0  . torsemide (DEMADEX) 20 MG tablet Take 80 mg (4 tabs) in am and 40 mg (2 tabs) in pm 180 tablet 6   No current facility-administered medications for this visit.     Allergies as of 04/13/2017  . (No Known  Allergies)    Past Medical History:  Diagnosis Date  . Adenocarcinoma of rectum ([place])    a. 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 Anemia   . CAD (coronary artery disease)    a. BMS to LAD 2001 at [facility] b. PTCA/atherectomy ramus and BMS to LAD 2009  . CHF (congestive heart failure) ([place])   . Cholelithiasis 06/2015  . Chronic kidney disease, stage IV (severe) ([place])   . Chronic systolic heart failure ([place])   . Colostomy in place [facility])   . Dizziness    a. chronic. Admission for this 07/18/2014  . DM type 2, uncontrolled, with renal complications ([place])   . Essential hypertension, benign   . GERD (gastroesophageal reflux disease)   . HCAP (healthcare-associated pneumonia) 07/21/2015  . Hematuria   . History of blood transfusion    "I've had 2 units so far this year" (09/27/2015)  . HLD (hyperlipidemia)   . Ischemic cardiomyopathy    EF 18% by nuclear study 2016, multiple myocardial infarctions in past    . Myocardial infarction ([place]) 2001  . Obesity   . Orthostatic hypotension   . OSA on CPAP   . Paroxysmal atrial fibrillation ([facility])    a. on amiodarone, digoxin and Eliquis  . PONV (postoperative nausea and vomiting)   . Prostate cancer ([place])    a. s/p seed implants with chemo and radiation  . SAH (subarachnoid hemorrhage) ([place])    post-traumatic (fall) [facility] 12/2014  . TIA (transient ischemic attack)     Past Surgical History:  Procedure Laterality Date  . Abdominal and Perineal Resection of Rectum with Total Mesorectal Excision  10/04/2007  . AV FISTULA PLACEMENT Right 09/16/2015   Procedure: ARTERIOVENOUS (AV) FISTULA CREATION - BRACHIOCEPHALIC;  Surgeon: Elam Dutch, MD;  Location: [place];  Service: Vascular;  Laterality: Right;  . BI-VENTRICULAR IMPLANTABLE CARDIOVERTER DEFIBRILLATOR  (CRT-D)  2009  . CARDIAC CATHETERIZATION  08/2001; 2009   ; [name] 07/10/2013  . CARDIAC DEFIBRILLATOR PLACEMENT  2002  . CARDIAC DEFIBRILLATOR PLACEMENT  2009   Upgraded to a BiV ICD  . COLONOSCOPY  09/14/2011    Dr. Gala Romney: via colostomy, Single pedunculated benign inflammatory polyp. Due for surveillance Oct 2015  . COLONOSCOPY N/A 07/02/2014   Procedure: COLONOSCOPY;  Surgeon: Daneil Dolin, MD;  [facility];  Service: Endoscopy;  Laterality: N/A;  7:30 / COLONOSCOPY THRU COLOSTOMY  . COLONOSCOPY N/A 12/11/2014   Dr. Gala Romney via colostomy. Normal. Repeat in 2021.   Marland Kitchen COLONOSCOPY N/A 08/24/2015   Dr. Havery Moros: diminutive polyp not removed, stoma site of bleeding  . COLOSTOMY  2008  . CORONARY ANGIOPLASTY WITH STENT PLACEMENT  2001; ~ 2006   "1 + 1"   . ELECTROPHYSIOLOGIC STUDY N/A 08/28/2015   Procedure: AV Node Ablation;  Surgeon: Will Meredith Leeds, MD;  Location: [place] CV LAB;  Service: Cardiovascular;  Laterality: N/A;  . EP IMPLANTABLE DEVICE N/A 04/10/2015   Procedure: Ppm/Biv Ppm Generator Changeout;  Surgeon: Evans Lance, MD;  Location: [facility] INVASIVE CV LAB CUPID;  Service: Cardiovascular;  Laterality: N/A;  . ERCP N/A 06/11/2016   Procedure: ENDOSCOPIC RETROGRADE CHOLANGIOPANCREATOGRAPHY (ERCP);  Surgeon: Teena Irani, MD;  Location: [facility] ENDOSCOPY;  Service: Endoscopy;  Laterality: N/A;  . ERCP N/A 01/26/2017   Procedure: ENDOSCOPIC RETROGRADE CHOLANGIOPANCREATOGRAPHY (ERCP);  Surgeon: Teena Irani, MD;  Location: [facility] ENDOSCOPY;  Service: Endoscopy;  Laterality: N/A;  . ERCP  06/2016   Dr. Amedeo Plenty: duodenal fistula, dilated bile duct but no obvious stones, s/p sphincterotomy and stent placement  . ERCP  01/2017   Dr. Amedeo Plenty: CBD stones, s/p sphincterotomy, balloon extraction, stent removal  . ESOPHAGOGASTRODUODENOSCOPY N/A 07/02/2014   Procedure: ESOPHAGOGASTRODUODENOSCOPY (EGD);  Surgeon: Daneil Dolin, MD;  [facility];  Service: Endoscopy;  Laterality: N/A;  7:30  . ESOPHAGOGASTRODUODENOSCOPY N/A 12/11/2014   TML:[id number] EGD  . GASTROINTESTINAL STENT REMOVAL N/A 01/26/2017   Procedure: GASTROINTESTINAL STENT REMOVAL;  Surgeon: Teena Irani, MD;  Location: [place];   Service: Endoscopy;  Laterality: N/A;  . GIVENS CAPSULE STUDY N/A 07/23/2015   Dr. Michail Sermon: small non-bleeding AVM, mild gastritis  . LEFT HEART CATHETERIZATION WITH CORONARY ANGIOGRAM N/A 07/13/2013   Procedure: LEFT HEART CATHETERIZATION WITH CORONARY ANGIOGRAM;  Surgeon: Lorretta Harp, MD;  Location: [facility] CATH LAB;  Service: Cardiovascular;  Laterality: N/A;  . Venia Minks DILATION N/A 07/02/2014   Procedure: Venia Minks DILATION;  Surgeon: Daneil Dolin, MD;  [facility];  Service: Endoscopy;  Laterality: N/A;  7:30  . PORTACATH PLACEMENT  06/2007   "removed ~ 1 yr later"  . RIGHT HEART CATHETERIZATION N/A 02/24/2015   Procedure: RIGHT HEART CATH;  Surgeon: Jolaine Artist, MD;  Location: [facility] CATH LAB;  Service: Cardiovascular;  Laterality: N/A;  . SAVORY DILATION N/A 07/02/2014   Procedure: SAVORY DILATION;  Surgeon: Daneil Dolin, MD;  [facility];  Service: Endoscopy;  Laterality: N/A;  7:30    Family History  Problem Relation Age of Onset  . Colon cancer Mother 65  . Coronary artery disease Father   . Colon cancer Sister 60  . Diabetes Sister   . Colon cancer Other     2 cousins, succumbed to illness    Social History   Social History  . Marital status: Married    Spouse name: N/A  . Number of children: 1  . Years of education: N/A   Occupational History  .  Unemployed   Social History Main Topics  . Smoking status: Never Smoker  . Smokeless tobacco: Never Used  . Alcohol use No     Comment: Former user 45 years ago  . Drug use: No  . Sexual activity: No   Other Topics Concern  . Not on file   Social History Narrative   Married 53 years   1 son, 2 grandkids    Denies alcohol, drugs, tobacco       ROS:  General: Negative for anorexia,   fever, chills, fatigue, +weakness.+weight loss Eyes: Negative for vision changes.  ENT: Negative for hoarseness,   nasal congestion. See hpi CV: Negative for chest pain, angina, palpitations, dyspnea on  exertion, peripheral edema.  Respiratory: Negative for dyspnea at rest, dyspnea on exertion, cough, sputum, wheezing.  GI: See history of present illness. GU:  Negative for dysuria, hematuria, urinary incontinence, urinary frequency, nocturnal urination.  MS: Negative for joint pain, low back pain.  Derm: Negative for rash or itching.  Neuro: Negative for weakness,  abnormal sensation, seizure, frequent headaches, memory loss, confusion.  Psych: Negative for anxiety, depression, suicidal ideation, hallucinations.  Endo: see hpi Heme: Negative for bruising or bleeding. Allergy: Negative for rash or hives.    Physical Examination:  BP 107/71   Pulse 84   Temp (!) 96.6 F (35.9 C) (Oral)   Ht 5\' 10"  (1.778 m)   Wt 175 lb (79.4 kg)   BMI 25.11 kg/m    General: chronically ill-appearing WM, NAD.   Head: Normocephalic, atraumatic.   Eyes: Conjunctiva pink, no icterus. Mouth: Oropharyngeal mucosa moist and pink , no lesions erythema or exudate. Neck: Supple without thyromegaly, masses, or lymphadenopathy.  Lungs: Clear to auscultation bilaterally.  Heart: Regular rate and rhythm, no murmurs rubs or gallops.  Abdomen: Bowel sounds are normal, nontender, nondistended, no hepatosplenomegaly or masses, no abdominal bruits or    hernia , no rebound or guarding.  Ostomy in llq Rectal: not performed Extremities: No lower extremity edema. No clubbing or deformities.  Neuro: Alert and oriented x 4 , grossly normal neurologically.  Skin: Warm and dry, no rash or jaundice.  +bruising Psych: Alert and cooperative, normal mood and affect.  Labs: Lab Results  Component Value Date   INR 2.23 04/06/2017   INR 1.4 09/20/2016   INR 2.2 09/06/2016   Lab Results  Component Value Date   CREATININE 2.91 (H) 04/06/2017   BUN 92 (H) 04/06/2017   NA 132 (L) 04/06/2017   K 4.3 04/06/2017   CL 93 (L) 04/06/2017   CO2 27 04/06/2017   Lab Results  Component Value Date   ALT 14 (L) 04/06/2017    AST 25 04/06/2017   ALKPHOS 95 04/06/2017   BILITOT 2.2 (H) 04/06/2017   Lab Results  Component Value Date   WBC 7.5 04/06/2017   HGB 13.0 04/06/2017   HCT 38.7 (L) 04/06/2017   MCV 91.1 04/06/2017   PLT 98 (L) 04/06/2017     Imaging Studies: No results found.

## 2017-04-18 NOTE — Discharge Instructions (Addendum)
Continue Protonix 40 mg daily  Gradually resume diet. Chew food thoroughly-particularly meat; have adequate fluids on hand to assist with swallowing  Resume Eliquis tomorrow  Office visit with [place] in 2 months. July 16 at 11:00 am with Randall Hiss  EGD Discharge instructions Please read the instructions outlined below and refer to this sheet in the next few weeks. These discharge instructions provide you with general information on caring for yourself after you leave the hospital. Your doctor may also give you specific instructions. While your treatment has been planned according to the most current medical practices available, unavoidable complications occasionally occur. If you have any problems or questions after discharge, please call your doctor. ACTIVITY  You may resume your regular activity but move at a slower pace for the next 24 hours.   Take frequent rest periods for the next 24 hours.   Walking will help expel (get rid of) the air and reduce the bloated feeling in your abdomen.   No driving for 24 hours (because of the anesthesia (medicine) used during the test).   You may shower.   Do not sign any important legal documents or operate any machinery for 24 hours (because of the anesthesia used during the test).  NUTRITION  Drink plenty of fluids.   You may resume your normal diet.   Begin with a light meal and progress to your normal diet.   Avoid alcoholic beverages for 24 hours or as instructed by your caregiver.  MEDICATIONS  You may resume your normal medications unless your caregiver tells you otherwise.  WHAT YOU CAN EXPECT TODAY  You may experience abdominal discomfort such as a feeling of fullness or gas pains.  FOLLOW-UP  Your doctor will discuss the results of your test with you.  SEEK IMMEDIATE MEDICAL ATTENTION IF ANY OF THE FOLLOWING OCCUR:  Excessive nausea (feeling sick to your stomach) and/or vomiting.   Severe abdominal pain and distention  (swelling).   Trouble swallowing.   Temperature over 101 F (37.8 C).   Rectal bleeding or vomiting of blood.

## 2017-04-18 NOTE — Telephone Encounter (Signed)
Needs to be scheduled for follow-up as soon as possible

## 2017-04-18 NOTE — Op Note (Signed)
[facility] Patient Name: [name] Procedure Date: 04/18/2017 9:54 AM MRN: [id number] Date of Birth: [birth year] Attending MD: Gennette Pac , MD CSN: [id number] Age: 61 Admit Type: Outpatient Procedure:                Upper GI endoscopy Indications:              Dysphagia Providers:                Gennette Pac, MD, Criselda Peaches. Patsy Lager, RN,                            Burke Keels, Technician Referring MD:              Medicines:                Propofol per Anesthesia Complications:            No immediate complications. Estimated Blood Loss:     Estimated blood loss was minimal. Procedure:                Pre-Anesthesia Assessment:                           - Prior to the procedure, a History and Physical                            was performed, and patient medications and                            allergies were reviewed. The patient's tolerance of                            previous anesthesia was also reviewed. The risks                            and benefits of the procedure and the sedation                            options and risks were discussed with the patient.                            All questions were answered, and informed consent                            was obtained. Prior Anticoagulants: The patient has                            taken no previous anticoagulant or antiplatelet                            agents and last took Eliquis (apixaban) 3 days                            prior to the procedure. ASA Grade Assessment: III -  A patient with severe systemic disease. After                            reviewing the risks and benefits, the patient was                            deemed in satisfactory condition to undergo the                            procedure.                           After obtaining informed consent, the endoscope was                            passed under direct vision. Throughout the                         procedure, the patient's blood pressure, pulse, and                            oxygen saturations were monitored continuously. The                            EG-[id number] ([id number]) scope was introduced through the                            and advanced to the second part of duodenum. The                            upper GI endoscopy was accomplished without                            difficulty. The patient tolerated the procedure                            well. Scope In: 10:18:16 AM Scope Out: 10:27:16 AM Total Procedure Duration: 0 hours 9 minutes 0 seconds  Findings:      The examined esophagus was normal. .      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      The duodenal bulb and second portion of the duodenum were normal. The       scope was withdrawn. Dilation was performed with a Maloney dilator with       no resistance at 56 Fr. The scope was withdrawn. Dilation was performed       with a Maloney dilator with mild resistance at 58 Fr. The dilation site       was examined following endoscope reinsertion and showed mild improvement       in luminal narrowing. Estimated blood loss was minimal. Superficial Tear       a GE Junction. Impression:               - Normal esophagus. Dilated.                           - Portal  hypertensive gastropathy.                           - Normal duodenal bulb and second portion of the                            duodenum.                           - No specimens collected. Moderate Sedation:      Moderate (conscious) sedation was personally administered by an       anesthesia professional. The following parameters were monitored: oxygen       saturation, heart rate, blood pressure, respiratory rate, EKG, adequacy       of pulmonary ventilation, and response to care. Total physician       intraservice time was 22 minutes. Recommendation:           - Patient has a contact number available for                             emergencies. The signs and symptoms of potential                            delayed complications were discussed with the                            patient. Return to normal activities tomorrow.                            Written discharge instructions were provided to the                            patient.                           - Resume previous diet.                           - Continue present medications.                           - Return to GI office in 2 months.                           - Patient has a contact number available for                            emergencies. The signs and symptoms of potential                            delayed complications were discussed with the                            patient. Return to normal activities tomorrow.  Written discharge instructions were provided to the                            patient.                           - Resume Eliquis (apixaban) at prior dose tomorrow.                           - Repeat upper endoscopy in 2 years for screening                            purposes.                           - Return to GI clinic in 8 weeks. Procedure Code(s):        --- Professional ---                           [phone], 51, Esophagogastroduodenoscopy, flexible,                            transoral; diagnostic, including collection of                            specimen(s) by brushing or washing, when performed                            (separate procedure)                           43450, Dilation of esophagus, by unguided sound or                            bougie, single or multiple passes Diagnosis Code(s):        --- Professional ---                           K76.6, Portal hypertension                           K31.89, Other diseases of stomach and duodenum                           R13.10, Dysphagia, unspecified CPT copyright 2016 [employer]. All rights reserved. The  codes documented in this report are preliminary and upon coder review may  be revised to meet current compliance requirements. [name]. [name], MD Gennette Pac, MD 04/18/2017 10:45:39 AM This report has been signed electronically. Number of Addenda: 0

## 2017-04-18 NOTE — Telephone Encounter (Signed)
Patient no showed today's appt. Please advise on how to follow up. °A. No follow up necessary. °B. Follow up urgent. Contact patient immediately. °C. Follow up necessary. Contact patient and schedule visit in ___ days. °D. Follow up advised. Contact patient and schedule visit in ____weeks. ° °

## 2017-04-18 NOTE — Anesthesia Procedure Notes (Signed)
Procedure Name: MAC Date/Time: 04/18/2017 10:09 AM Performed by: Vista Deck Pre-anesthesia Checklist: Patient identified, Emergency Drugs available, Suction available, Timeout performed and Patient being monitored Patient Re-evaluated:Patient Re-evaluated prior to inductionOxygen Delivery Method: Non-rebreather mask

## 2017-04-18 NOTE — Anesthesia Preprocedure Evaluation (Signed)
Anesthesia Evaluation  Patient identified by MRN, date of birth, ID band Patient awake    Reviewed: Allergy & Precautions, H&P , NPO status , Patient's Chart, lab work & pertinent test results  History of Anesthesia Complications (+) PONV  Airway Mallampati: II  TM Distance: >3 FB Neck ROM: Full    Dental no notable dental hx. (+) Teeth Intact, Dental Advisory Given   Pulmonary sleep apnea ,    Pulmonary exam normal breath sounds clear to auscultation       Cardiovascular hypertension, Pt. on medications and Pt. on home beta blockers + CAD, + Past MI, + Cardiac Stents and +CHF  + dysrhythmias Atrial Fibrillation  Rhythm:Regular Rate:Normal     Neuro/Psych negative neurological ROS  negative psych ROS   GI/Hepatic Neg liver ROS, GERD  Medicated and Controlled,Varices    Endo/Other  diabetes  Renal/GU Renal InsufficiencyRenal disease  negative genitourinary   Musculoskeletal   Abdominal   Peds  Hematology negative hematology ROS (+) anemia ,   Anesthesia Other Findings   Reproductive/Obstetrics negative OB ROS                             Anesthesia Physical Anesthesia Plan  ASA: IV  Anesthesia Plan: MAC   Post-op Pain Management:    Induction: Intravenous  Airway Management Planned: Simple Face Mask  Additional Equipment:   Intra-op Plan:   Post-operative Plan:   Informed Consent: I have reviewed the patients History and Physical, chart, labs and discussed the procedure including the risks, benefits and alternatives for the proposed anesthesia with the patient or authorized representative who has indicated his/her understanding and acceptance.     Plan Discussed with:   Anesthesia Plan Comments:         Anesthesia Quick Evaluation

## 2017-04-18 NOTE — Interval H&P Note (Signed)
History and Physical Interval Note:  04/18/2017 10:01 AM  [name]  has presented today for surgery, with the diagnosis of esophageal dysphagia, weight loss  The various methods of treatment have been discussed with the patient and family. After consideration of risks, benefits and other options for treatment, the patient has consented to  Procedure(s) with comments: ESOPHAGOGASTRODUODENOSCOPY (EGD) WITH PROPOFOL (N/A) - 2:30pm MALONEY DILATION (N/A) ESOPHAGEAL VARICES BANDING (N/A) - Possible Esophageal Variceal Banding as a surgical intervention .  The patient's history has been reviewed, patient examined, no change in status, stable for surgery.  I have reviewed the patient's chart and labs.  Questions were answered to the patient's satisfaction.     Robert Rourk  No change. Anticoagulation stopped 3 days ago per plan. EGD with ED as feasible/appropriate.  The risks, benefits, limitations, alternatives and imponderables have been reviewed with the patient. Potential for esophageal dilation, biopsy, etc. have also been reviewed.  Questions have been answered. All parties agreeable.

## 2017-04-18 NOTE — Transfer of Care (Signed)
Immediate Anesthesia Transfer of Care Note  Patient: [name]  Procedure(s) Performed: Procedure(s) with comments: ESOPHAGOGASTRODUODENOSCOPY (EGD) WITH PROPOFOL (N/A) - 2:30pm MALONEY DILATION (N/A)  Patient Location: PACU  Anesthesia Type:MAC  Level of Consciousness: sedated  Airway & Oxygen Therapy: Patient Spontanous Breathing and non-rebreather face mask  Post-op Assessment: Report given to RN and Post -op Vital signs reviewed and stable  Post vital signs: Reviewed and stable  Last Vitals: There were no vitals filed for this visit.  Last Pain: There were no vitals filed for this visit.       Complications: No apparent anesthesia complications

## 2017-04-19 ENCOUNTER — Ambulatory Visit (INDEPENDENT_AMBULATORY_CARE_PROVIDER_SITE_OTHER): Payer: Medicare Other | Admitting: Cardiology

## 2017-04-19 ENCOUNTER — Encounter: Payer: Self-pay | Admitting: Cardiology

## 2017-04-19 VITALS — BP 104/68 | HR 71 | Ht 70.0 in | Wt 182.6 lb

## 2017-04-19 DIAGNOSIS — E782 Mixed hyperlipidemia: Secondary | ICD-10-CM | POA: Diagnosis not present

## 2017-04-19 DIAGNOSIS — I2510 Atherosclerotic heart disease of native coronary artery without angina pectoris: Secondary | ICD-10-CM

## 2017-04-19 DIAGNOSIS — I482 Chronic atrial fibrillation, unspecified: Secondary | ICD-10-CM

## 2017-04-19 DIAGNOSIS — I5022 Chronic systolic (congestive) heart failure: Secondary | ICD-10-CM | POA: Diagnosis not present

## 2017-04-19 DIAGNOSIS — I255 Ischemic cardiomyopathy: Secondary | ICD-10-CM | POA: Diagnosis not present

## 2017-04-19 LAB — POCT I-STAT 4, (NA,K, GLUC, HGB,HCT)
GLUCOSE: 130 mg/dL — AB (ref 65–99)
HEMATOCRIT: 41 % (ref 39.0–52.0)
Hemoglobin: 13.9 g/dL (ref 13.0–17.0)
POTASSIUM: 4.1 mmol/L (ref 3.5–5.1)
Sodium: 134 mmol/L — ABNORMAL LOW (ref 135–145)

## 2017-04-19 MED ORDER — METOLAZONE 2.5 MG PO TABS: ORAL_TABLET | ORAL | 3 refills | Status: DC

## 2017-04-19 NOTE — Patient Instructions (Signed)
Your physician recommends that you schedule a follow-up appointment in: [place] has recommended you make the following change in your medication:   INCREASE TORSEMIDE 80 MG TWICE DAILY FOR 3 DAYS THEN RESUME NORMAL DOSE  Your physician recommends that you return for lab work in: [place] BMP/[facility]  CALL TO UPDATE [place] ON Thursday OR Friday REGARDING YOUR SWELLING  Thank you for choosing [facility]!!

## 2017-04-19 NOTE — Progress Notes (Signed)
Clinical Summary Mr. Dudenhoeffer is a 61 y.o.male seen today for follow up of the following medical problems.    1. CAD/ICM  - prior BMS to LAD in 2001, repeat BMS to LAD in 2009.  - echo 04/2015 LVEF 35% - echo 07/2016 LVEF 15-20% - he has BiV AICD placed by Dr Caryl Comes, followed by Dr Lovena Le. Generator change 04/10/15.  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  - cath 07/2013 as reported below, no culprit vessels, managed medicallyHF - medical therapy has been somewhat limited previously due to problems with orthostatic dizziness and falls. Patient with previous fall resulting in subarachnoid hemorrhage.  - admit 04/2015 with acute on chronic systolic HF, also with NSTEMI and troponin of 4.29. Due to poor renal function a Lexiscan MPI was obtained which showed prior infarct without significant current ischemia.  - he was previously followed by [facility] heart failure, now followed by [facility] with Dr Haroldine Laws. Has been deemed not to be a VAD or transplant candidate.  - he has CRT-D followed by EP, s/p AV nodal ablation for afib   - swelling started few weeks ago in legs. Notes weight down due to poor appetite. No recent SOB/DOE - compliant with meds, no necent chest pain.    2. Afib/Aflutter  - s/p AV nodal ablation 08/2015, now with CRT-D.   - no recent palpitations - some bruising on eliquis. Can have some bleeding from stoma site, he has appt with GI coming up   3. Hyperlipidemia  - he is scompliant with statin   4. Bilateral carotid stenosis -history of mild bilateral disease - no neuro symptoms  5. CKD - followed by Dr Hinda Lenis   6. OSA - he remains compliant with CPAP   Past Medical History:  Diagnosis Date  . Adenocarcinoma of rectum ([place])    a. 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 AICD (automatic cardioverter/defibrillator) present 2002   BI V ICD  . Anemia     . CAD (coronary artery disease)    a. BMS to LAD 2001 at [facility] b. PTCA/atherectomy ramus and BMS to LAD 2009  . CHF (congestive heart failure) ([place])   . Cholelithiasis 06/2015  . Chronic kidney disease, stage IV (severe) ([place])   . Chronic systolic heart failure ([place])   . Colostomy in place [facility])   . Dizziness    a. chronic. Admission for this 07/18/2014  . DM type 2, uncontrolled, with renal complications ([place])   . Dysrhythmia   . Essential hypertension, benign   . GERD (gastroesophageal reflux disease)   . HCAP (healthcare-associated pneumonia) 07/21/2015  . Hematuria   . History of blood transfusion    "I've had 2 units so far this year" (09/27/2015)  . HLD (hyperlipidemia)   . Ischemic cardiomyopathy    EF 18% by nuclear study 2016, multiple myocardial infarctions in past    . Myocardial infarction ([place]) 2001  . Obesity   . Orthostatic hypotension   . OSA on CPAP   . Paroxysmal atrial fibrillation ([facility])    a. on amiodarone, digoxin and Eliquis  . PONV (postoperative nausea and vomiting)   . Prostate cancer ([place])    a. s/p seed implants with chemo and radiation  . SAH (subarachnoid hemorrhage) ([place])    post-traumatic (fall) [facility] 12/2014  . TIA (transient ischemic attack)      No Known Allergies   Current Outpatient  Prescriptions  Medication Sig Dispense Refill  . apixaban (ELIQUIS) 5 MG TABS tablet Take 1 tablet (5 mg total) by mouth 2 (two) times daily. 60 tablet 6  . atorvastatin (LIPITOR) 40 MG tablet Take 1 tablet (40 mg total) by mouth daily. 30 tablet 6  . Coenzyme Q10 (COQ10) 50 MG CAPS Take 1 capsule by mouth daily.    Marland Kitchen glucose blood test strip Use to test blood sugar 3 times daily 100 each 4  . insulin aspart (NOVOLOG FLEXPEN) 100 UNIT/ML FlexPen 5 units before each meal (Patient taking differently: Inject 5 Units into the skin 3 (three) times daily with meals. 5 units before each meal) 15 mL 1  . Insulin Glargine (LANTUS SOLOSTAR) 100 UNIT/ML Solostar Pen Inject  15 Units into the skin daily at 10 pm. 5 pen 3  . levothyroxine (SYNTHROID, LEVOTHROID) 25 MCG tablet Take 25 mcg by mouth daily before breakfast.    . metolazone (ZAROXOLYN) 2.5 MG tablet take 1 tablet by mouth weekly if needed for SWELLING 12 tablet 0  . metoprolol succinate (TOPROL-XL) 25 MG 24 hr tablet take 1/2 tablet by mouth twice a day 45 tablet 3  . nitroGLYCERIN (NITROLINGUAL) 0.4 MG/SPRAY spray Place 1 spray under the tongue every 5 (five) minutes x 3 doses as needed for chest pain. Reported on 06/01/2016    . pantoprazole (PROTONIX) 40 MG tablet Take 1 tablet (40 mg total) by mouth daily. 30 tablet 0  . potassium chloride SA (K-DUR,KLOR-CON) 20 MEQ tablet Take 20 mEq by mouth daily.    Marland Kitchen PROAIR RESPICLICK 937 (90 BASE) MCG/ACT AEPB Inhale 2 puffs into the lungs every 6 (six) hours as needed (for breathing).   0  . torsemide (DEMADEX) 20 MG tablet Take 80 mg (4 tabs) in am and 40 mg (2 tabs) in pm 180 tablet 6   No current facility-administered medications for this visit.      Past Surgical History:  Procedure Laterality Date  . Abdominal and Perineal Resection of Rectum with Total Mesorectal Excision  10/04/2007  . AV FISTULA PLACEMENT Right 09/16/2015   Procedure: ARTERIOVENOUS (AV) FISTULA CREATION - BRACHIOCEPHALIC;  Surgeon: Elam Dutch, MD;  Location: [facility];  Service: Vascular;  Laterality: Right;  . BI-VENTRICULAR IMPLANTABLE CARDIOVERTER DEFIBRILLATOR  (CRT-D)  2009  . CARDIAC CATHETERIZATION  08/2001; 2009   ; [name] 07/10/2013  . CARDIAC DEFIBRILLATOR PLACEMENT  2002  . CARDIAC DEFIBRILLATOR PLACEMENT  2009   Upgraded to a BiV ICD  . COLONOSCOPY  09/14/2011   Dr. Gala Romney: via colostomy, Single pedunculated benign inflammatory polyp. Due for surveillance Oct 2015  . COLONOSCOPY N/A 07/02/2014   Procedure: COLONOSCOPY;  Surgeon: Daneil Dolin, MD;  [facility];  Service: Endoscopy;  Laterality: N/A;  7:30 / COLONOSCOPY THRU COLOSTOMY  . COLONOSCOPY N/A  12/11/2014   Dr. Gala Romney via colostomy. Normal. Repeat in 2021.   Marland Kitchen COLONOSCOPY N/A 08/24/2015   Dr. Havery Moros: diminutive polyp not removed, stoma site of bleeding  . COLOSTOMY  2008  . CORONARY ANGIOPLASTY WITH STENT PLACEMENT  2001; ~ 2006   "1 + 1"   . ELECTROPHYSIOLOGIC STUDY N/A 08/28/2015   Procedure: AV Node Ablation;  Surgeon: Will Meredith Leeds, MD;  Location: [place] CV LAB;  Service: Cardiovascular;  Laterality: N/A;  . EP IMPLANTABLE DEVICE N/A 04/10/2015   Procedure: Ppm/Biv Ppm Generator Changeout;  Surgeon: Evans Lance, MD;  Location: [place] CV LAB CUPID;  Service: Cardiovascular;  Laterality: N/A;  .  ERCP N/A 06/11/2016   Procedure: ENDOSCOPIC RETROGRADE CHOLANGIOPANCREATOGRAPHY (ERCP);  Surgeon: Teena Irani, MD;  Location: [facility] ENDOSCOPY;  Service: Endoscopy;  Laterality: N/A;  . ERCP N/A 01/26/2017   Procedure: ENDOSCOPIC RETROGRADE CHOLANGIOPANCREATOGRAPHY (ERCP);  Surgeon: Teena Irani, MD;  Location: [facility] ENDOSCOPY;  Service: Endoscopy;  Laterality: N/A;  . ERCP  06/2016   Dr. Amedeo Plenty: duodenal fistula, dilated bile duct but no obvious stones, s/p sphincterotomy and stent placement  . ERCP  01/2017   Dr. Amedeo Plenty: CBD stones, s/p sphincterotomy, balloon extraction, stent removal  . ESOPHAGOGASTRODUODENOSCOPY N/A 07/02/2014   Procedure: ESOPHAGOGASTRODUODENOSCOPY (EGD);  Surgeon: Daneil Dolin, MD;  [facility];  Service: Endoscopy;  Laterality: N/A;  7:30  . ESOPHAGOGASTRODUODENOSCOPY N/A 12/11/2014   DJM:EQASTM EGD  . GASTROINTESTINAL STENT REMOVAL N/A 01/26/2017   Procedure: GASTROINTESTINAL STENT REMOVAL;  Surgeon: Teena Irani, MD;  Location: [place];  Service: Endoscopy;  Laterality: N/A;  . GIVENS CAPSULE STUDY N/A 07/23/2015   Dr. Michail Sermon: small non-bleeding AVM, mild gastritis  . LEFT HEART CATHETERIZATION WITH CORONARY ANGIOGRAM N/A 07/13/2013   Procedure: LEFT HEART CATHETERIZATION WITH CORONARY ANGIOGRAM;  Surgeon: Lorretta Harp, MD;  Location: [facility] CATH  LAB;  Service: Cardiovascular;  Laterality: N/A;  . Venia Minks DILATION N/A 07/02/2014   Procedure: Venia Minks DILATION;  Surgeon: Daneil Dolin, MD;  [facility];  Service: Endoscopy;  Laterality: N/A;  7:30  . PORTACATH PLACEMENT  06/2007   "removed ~ 1 yr later"  . RIGHT HEART CATHETERIZATION N/A 02/24/2015   Procedure: RIGHT HEART CATH;  Surgeon: Jolaine Artist, MD;  Location: [facility] CATH LAB;  Service: Cardiovascular;  Laterality: N/A;  . SAVORY DILATION N/A 07/02/2014   Procedure: SAVORY DILATION;  Surgeon: Daneil Dolin, MD;  [facility];  Service: Endoscopy;  Laterality: N/A;  7:30     No Known Allergies    Family History  Problem Relation Age of Onset  . Colon cancer Mother 77  . Coronary artery disease Father   . Colon cancer Sister 17  . Diabetes Sister   . Colon cancer Other        2 cousins, succumbed to illness     Social History Mr. Stovall reports that he has never smoked. He has never used smokeless tobacco. Mr. Douse reports that he does not drink alcohol.   Review of Systems CONSTITUTIONAL: No weight loss, fever, chills, weakness or fatigue.  HEENT: Eyes: No visual loss, blurred vision, double vision or yellow sclerae.No hearing loss, sneezing, congestion, runny nose or sore throat.  SKIN: No rash or itching.  CARDIOVASCULAR: per hpi RESPIRATORY: No shortness of breath, cough or sputum.  GASTROINTESTINAL: No anorexia, nausea, vomiting or diarrhea. No abdominal pain or blood.  GENITOURINARY: No burning on urination, no polyuria NEUROLOGICAL: No headache, dizziness, syncope, paralysis, ataxia, numbness or tingling in the extremities. No change in bowel or bladder control.  MUSCULOSKELETAL: No muscle, back pain, joint pain or stiffness.  LYMPHATICS: No enlarged nodes. No history of splenectomy.  PSYCHIATRIC: No history of depression or anxiety.  ENDOCRINOLOGIC: No reports of sweating, cold or heat intolerance. No polyuria or polydipsia.   Marland Kitchen   Physical Examination Vitals:   04/19/17 0812  BP: 104/68  Pulse: 71   Vitals:   04/19/17 0812  Weight: 182 lb 9.6 oz (82.8 kg)  Height: 5\' 10"  (1.778 m)    Gen: resting comfortably, no acute distress HEENT: no scleral icterus, pupils equal round and reactive, no palptable cervical adenopathy,  CV: RRR, no m/r/g, no jvd Resp: Clear to auscultation bilaterally GI: abdomen is soft, non-tender, non-distended, normal bowel sounds, no hepatosplenomegaly MSK: extremities are warm, 1+ bilateral LE edema Skin: warm, no rash Neuro:  no focal deficits Psych: appropriate affect   Diagnostic Studies 08/2013 Echo  LVEF 30-35%, + WMAs, grade I diastolic dysfunction, mild MR,   07/2013 Cath  HEMODYNAMICS:   AO SYSTOLIC/AO DIASTOLIC: 270/35  ANGIOGRAPHIC RESULTS:   1. Left main; normal  2. LAD; the entire proximal third of the LAD was fluoroscopically calcified. The proximal third had approximately 50-60% segmental stenosis. The stent was widely patent in the proximal third of the LAD with 30-40% in-stent restenosis". There was a moderate size first diagonal Mira Balon and the ramus distribution that was occluded in its proximal portion and filled by collaterals. This was noted to be highly diseased at his last cath in 2009. There was 50-60% segmental stenosis in the middle third and 70% in the distal/apical third. There were 2 small marginal branches arising from the middle third that had 90% ostial stenoses unchanged from prior cath 3. Left circumflex; dominant with 99% long segmental proximal OM1 stenosis in a small to medium-size vessel unchanged from prior cath 4. Right coronary artery; nondominant with 50% mid and 80% distal stenosis unchanged from prior cath  5. 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 The only change in his  anatomy from 5 years ago was occlusion of the high first diagonal Yacoub Diltz which was severely diffusely diseased previously. There are no "culprit vessels. The patient is already on maximal medical therapy. Plans will be continued medical therapy. The sheath was removed and a TR Band was placed on the right wrist to achieve patent hemostasis. The patient left the Cath Lab in stable condition. He'll be gently hydrated and Coumadin will be restarted.   02/2014 Echo  Study Conclusions  - Procedure narrative: Transthoracic echocardiography. Image quality was suboptimal. The study was technically difficult, as a result of poor sound wave transmission. - Left ventricle: Systolic function is severely reduced, estimated EF 15-20%. Severe diffuse hypokinesis is noted. The cavity size was moderately dilated. Wall thickness was increased in a pattern of moderate LVH. Diastolic dysfunction is seen, indeterminate grade. The apex was poorly visualized. Doppler parameters are consistent with high ventricular filling pressure. - Ventricular septum: Septal motion showed abnormal function and dyssynergy. These changes are consistent with right ventricular pacing. - Aortic valve: Trileaflet; mildly thickened leaflets. There was no stenosis. - Mitral valve: Mildly thickened leaflets . Mild tethering of leaflet motion due to severe left ventricular dysfunction. Mild regurgitation. - Left atrium: The atrium was mildly to moderately dilated. - Right ventricle: The cavity size was normal. Wall thickness was normal. Pacer wire or catheter noted in right ventricle. Systolic function was mildly to moderately reduced. - Right atrium: Pacer wire or catheter noted in right atrium. - Atrial septum: The septum bowed from left to right, consistent with increased left atrial pressure. - Tricuspid valve: Mild regurgitation. - Pulmonary arteries: PA peak pressure: 48mm Hg (S). Mildly elevated pulmonary  pressures.   09/2014 Echo Study Conclusions  - Left ventricle: The cavity size was mildly dilated. Wall thickness was increased in a pattern of moderate LVH. Diastolic function is abnormal, indeterminant grade. Systolic function was moderately to severely reduced. The estimated ejection fraction was in the range of 30% to 35%. Evaluation of LVEF  is limited by limited visualization of the endocardium, consider contrast study for more accurate evaluation. - Aorta: The visualized portion of the proximal ascending aorta is mildly dilated measuring 3.8 cm. - Aortic root: The aortic root was normal in size. - Left atrium: The atrium was moderately dilated. - Right ventricle: Not well visualized. Grossly appears moderately enlarged with low normal systolic function. - Right atrium: Not well visualized. Grossly appears moderately enlarged. - Pulmonary arteries: PA peak pressure: 35 mm Hg (S). PASP is borderline elevated. - Inferior vena cava: The vessel was normal in size. The respirophasic diameter changes were in the normal range (>= 50%), consistent with normal central venous pressure. - Technically difficult study.   04/2015 Lexiscan  Findings consistent with ischemia.  This is a high risk study.  The left ventricular ejection fraction is severely decreased (<30%).  Defect 1: There is a small defect of moderate severity present in the mid anterior and mid anteroseptal location.  Defect 2: There is a large defect present in the apical anterior, apical septal, apical inferior and apex location.  04/2015 Echo Study Conclusions  - Left ventricle: POor acoustic windows limit study even with Definity use . LVEF is approximately is apprxoiamtely 35% with hypokinesis/akinesis of distal 1/3 of LV. The cavity size was severely dilated. Systolic function was normal. Wall motion was normal; there were no regional wall motion abnormalities. - Aortic  valve: There was trivial regurgitation. - Mitral valve: There was mild regurgitation. - Left atrium: The atrium was severely dilated. - Right ventricle: Systolic function was mildly reduced. - Pulmonary arteries: PA peak pressure: 55 mm Hg (S).  07/2016 echo Study Conclusions  - Left ventricle: The cavity size was moderately dilated. There was   mild focal basal hypertrophy of the septum. Systolic function was   severely reduced. The estimated ejection fraction was in the   range of 15% to 20%. There is akinesis of the anterolateral and   apical myocardium. Doppler parameters are consistent with a   reversible restrictive pattern, indicative of decreased left   ventricular diastolic compliance and/or increased left atrial   pressure (grade 3 diastolic dysfunction). - Aortic valve: There was mild regurgitation. - Aorta: Ascending aortic diameter: 41 mm (S). - Ascending aorta: The ascending aorta was mildly dilated. - Left atrium: The atrium was moderately dilated. - Right ventricle: The cavity size was moderately dilated. Wall   thickness was normal. Pacer wire or catheter noted in right   ventricle. - Right atrium: Pacer wire or catheter noted in right atrium.  Impressions:  - EF is mildly improved (10-15%).  Assessment and Plan  1.CAD/ICM/Chronic systolic HF - recent increase in LE edema - he will take metolazone additional dose this week on Wed, increase torsemide to 80mg  bid x 3 days then resume prior dosing. Call and update [place] later in the week on swelling.   2. Hyperlipidemia  - we will continue high dose statin  3. Afib /aflutter  - no current symptoms, lcontinue current meds    4. OSA - he remains compliant with CPAP   F/u 3 months     Arnoldo Lenis, M.D.

## 2017-04-21 ENCOUNTER — Encounter (HOSPITAL_COMMUNITY): Payer: Self-pay | Admitting: Internal Medicine

## 2017-04-25 DIAGNOSIS — K9401 Colostomy hemorrhage: Secondary | ICD-10-CM | POA: Diagnosis not present

## 2017-04-27 ENCOUNTER — Observation Stay (HOSPITAL_COMMUNITY)
Admission: EM | Admit: 2017-04-27 | Discharge: 2017-04-28 | Disposition: A | Discharge status: Home / Self Care | Payer: Medicare Other | Attending: Internal Medicine | Admitting: Internal Medicine

## 2017-04-27 ENCOUNTER — Encounter (HOSPITAL_COMMUNITY): Payer: Self-pay

## 2017-04-27 DIAGNOSIS — N184 Chronic kidney disease, stage 4 (severe): Secondary | ICD-10-CM | POA: Diagnosis not present

## 2017-04-27 DIAGNOSIS — Z955 Presence of coronary angioplasty implant and graft: Secondary | ICD-10-CM | POA: Insufficient documentation

## 2017-04-27 DIAGNOSIS — D638 Anemia in other chronic diseases classified elsewhere: Secondary | ICD-10-CM | POA: Diagnosis present

## 2017-04-27 DIAGNOSIS — E1122 Type 2 diabetes mellitus with diabetic chronic kidney disease: Secondary | ICD-10-CM | POA: Diagnosis not present

## 2017-04-27 DIAGNOSIS — IMO0002 Reserved for concepts with insufficient information to code with codable children: Secondary | ICD-10-CM | POA: Diagnosis present

## 2017-04-27 DIAGNOSIS — D630 Anemia in neoplastic disease: Secondary | ICD-10-CM | POA: Insufficient documentation

## 2017-04-27 DIAGNOSIS — Z85048 Personal history of other malignant neoplasm of rectum, rectosigmoid junction, and anus: Secondary | ICD-10-CM | POA: Diagnosis not present

## 2017-04-27 DIAGNOSIS — Z9989 Dependence on other enabling machines and devices: Secondary | ICD-10-CM

## 2017-04-27 DIAGNOSIS — Z8673 Personal history of transient ischemic attack (TIA), and cerebral infarction without residual deficits: Secondary | ICD-10-CM | POA: Insufficient documentation

## 2017-04-27 DIAGNOSIS — N179 Acute kidney failure, unspecified: Secondary | ICD-10-CM | POA: Diagnosis not present

## 2017-04-27 DIAGNOSIS — Z79899 Other long term (current) drug therapy: Secondary | ICD-10-CM | POA: Insufficient documentation

## 2017-04-27 DIAGNOSIS — K219 Gastro-esophageal reflux disease without esophagitis: Secondary | ICD-10-CM | POA: Diagnosis not present

## 2017-04-27 DIAGNOSIS — K922 Gastrointestinal hemorrhage, unspecified: Secondary | ICD-10-CM | POA: Diagnosis not present

## 2017-04-27 DIAGNOSIS — R740 Nonspecific elevation of levels of transaminase and lactic acid dehydrogenase [LDH]: Secondary | ICD-10-CM

## 2017-04-27 DIAGNOSIS — Z95810 Presence of automatic (implantable) cardiac defibrillator: Secondary | ICD-10-CM | POA: Diagnosis present

## 2017-04-27 DIAGNOSIS — E1165 Type 2 diabetes mellitus with hyperglycemia: Secondary | ICD-10-CM | POA: Diagnosis not present

## 2017-04-27 DIAGNOSIS — I480 Paroxysmal atrial fibrillation: Secondary | ICD-10-CM | POA: Diagnosis not present

## 2017-04-27 DIAGNOSIS — Z8546 Personal history of malignant neoplasm of prostate: Secondary | ICD-10-CM | POA: Insufficient documentation

## 2017-04-27 DIAGNOSIS — I5022 Chronic systolic (congestive) heart failure: Secondary | ICD-10-CM | POA: Insufficient documentation

## 2017-04-27 DIAGNOSIS — K3189 Other diseases of stomach and duodenum: Secondary | ICD-10-CM | POA: Diagnosis present

## 2017-04-27 DIAGNOSIS — G4733 Obstructive sleep apnea (adult) (pediatric): Secondary | ICD-10-CM | POA: Diagnosis not present

## 2017-04-27 DIAGNOSIS — D62 Acute posthemorrhagic anemia: Secondary | ICD-10-CM

## 2017-04-27 DIAGNOSIS — I2510 Atherosclerotic heart disease of native coronary artery without angina pectoris: Secondary | ICD-10-CM | POA: Diagnosis present

## 2017-04-27 DIAGNOSIS — E1129 Type 2 diabetes mellitus with other diabetic kidney complication: Secondary | ICD-10-CM | POA: Diagnosis present

## 2017-04-27 DIAGNOSIS — K7460 Unspecified cirrhosis of liver: Secondary | ICD-10-CM | POA: Insufficient documentation

## 2017-04-27 DIAGNOSIS — I252 Old myocardial infarction: Secondary | ICD-10-CM | POA: Diagnosis not present

## 2017-04-27 DIAGNOSIS — R7401 Elevation of levels of liver transaminase levels: Secondary | ICD-10-CM | POA: Diagnosis present

## 2017-04-27 DIAGNOSIS — I255 Ischemic cardiomyopathy: Secondary | ICD-10-CM | POA: Diagnosis not present

## 2017-04-27 DIAGNOSIS — I130 Hypertensive heart and chronic kidney disease with heart failure and stage 1 through stage 4 chronic kidney disease, or unspecified chronic kidney disease: Secondary | ICD-10-CM | POA: Insufficient documentation

## 2017-04-27 DIAGNOSIS — E785 Hyperlipidemia, unspecified: Secondary | ICD-10-CM | POA: Insufficient documentation

## 2017-04-27 DIAGNOSIS — K9401 Colostomy hemorrhage: Principal | ICD-10-CM | POA: Insufficient documentation

## 2017-04-27 DIAGNOSIS — K766 Portal hypertension: Secondary | ICD-10-CM | POA: Insufficient documentation

## 2017-04-27 DIAGNOSIS — T83498A Other mechanical complication of other prosthetic devices, implants and grafts of genital tract, initial encounter: Secondary | ICD-10-CM | POA: Diagnosis not present

## 2017-04-27 DIAGNOSIS — Z794 Long term (current) use of insulin: Secondary | ICD-10-CM | POA: Diagnosis not present

## 2017-04-27 DIAGNOSIS — Z7901 Long term (current) use of anticoagulants: Secondary | ICD-10-CM

## 2017-04-27 DIAGNOSIS — E876 Hypokalemia: Secondary | ICD-10-CM | POA: Diagnosis not present

## 2017-04-27 DIAGNOSIS — K9409 Other complications of colostomy: Secondary | ICD-10-CM | POA: Diagnosis not present

## 2017-04-27 DIAGNOSIS — D500 Iron deficiency anemia secondary to blood loss (chronic): Secondary | ICD-10-CM | POA: Diagnosis not present

## 2017-04-27 LAB — CBC WITH DIFFERENTIAL/PLATELET
BASOS ABS: 0 10*3/uL (ref 0.0–0.1)
BASOS PCT: 0 %
EOS ABS: 0.1 10*3/uL (ref 0.0–0.7)
EOS PCT: 1 %
HCT: 29.6 % — ABNORMAL LOW (ref 39.0–52.0)
Hemoglobin: 9.9 g/dL — ABNORMAL LOW (ref 13.0–17.0)
LYMPHS ABS: 1 10*3/uL (ref 0.7–4.0)
Lymphocytes Relative: 15 %
MCH: 31 pg (ref 26.0–34.0)
MCHC: 33.4 g/dL (ref 30.0–36.0)
MCV: 92.8 fL (ref 78.0–100.0)
Monocytes Absolute: 0.3 10*3/uL (ref 0.1–1.0)
Monocytes Relative: 4 %
NEUTROS PCT: 80 %
Neutro Abs: 5.4 10*3/uL (ref 1.7–7.7)
PLATELETS: 101 10*3/uL — AB (ref 150–400)
RBC: 3.19 MIL/uL — ABNORMAL LOW (ref 4.22–5.81)
RDW: 16.4 % — ABNORMAL HIGH (ref 11.5–15.5)
WBC: 6.8 10*3/uL (ref 4.0–10.5)

## 2017-04-27 LAB — RETICULOCYTES
RBC.: 3.19 MIL/uL — AB (ref 4.22–5.81)
RETIC CT PCT: 3.2 % — AB (ref 0.4–3.1)
Retic Count, Absolute: 102.1 10*3/uL (ref 19.0–186.0)

## 2017-04-27 LAB — COMPREHENSIVE METABOLIC PANEL
ALT: 19 U/L (ref 17–63)
AST: 28 U/L (ref 15–41)
Albumin: 3 g/dL — ABNORMAL LOW (ref 3.5–5.0)
Alkaline Phosphatase: 94 U/L (ref 38–126)
Anion gap: 13 (ref 5–15)
BUN: 94 mg/dL — ABNORMAL HIGH (ref 6–20)
CHLORIDE: 90 mmol/L — AB (ref 101–111)
CO2: 29 mmol/L (ref 22–32)
Calcium: 9.2 mg/dL (ref 8.9–10.3)
Creatinine, Ser: 3.2 mg/dL — ABNORMAL HIGH (ref 0.61–1.24)
GFR calc non Af Amer: 19 mL/min — ABNORMAL LOW (ref >60)
GFR, EST AFRICAN AMERICAN: 22 mL/min — AB (ref >60)
Glucose, Bld: 224 mg/dL — ABNORMAL HIGH (ref 65–99)
POTASSIUM: 2.8 mmol/L — AB (ref 3.5–5.1)
SODIUM: 132 mmol/L — AB (ref 135–145)
Total Bilirubin: 2 mg/dL — ABNORMAL HIGH (ref 0.3–1.2)
Total Protein: 7.1 g/dL (ref 6.5–8.1)

## 2017-04-27 LAB — GLUCOSE, CAPILLARY
GLUCOSE-CAPILLARY: 208 mg/dL — AB (ref 65–99)
Glucose-Capillary: 189 mg/dL — ABNORMAL HIGH (ref 65–99)

## 2017-04-27 LAB — CBC
HCT: 29.3 % — ABNORMAL LOW (ref 39.0–52.0)
HEMOGLOBIN: 9.8 g/dL — AB (ref 13.0–17.0)
MCH: 31.2 pg (ref 26.0–34.0)
MCHC: 33.4 g/dL (ref 30.0–36.0)
MCV: 93.3 fL (ref 78.0–100.0)
Platelets: 103 10*3/uL — ABNORMAL LOW (ref 150–400)
RBC: 3.14 MIL/uL — AB (ref 4.22–5.81)
RDW: 16.7 % — ABNORMAL HIGH (ref 11.5–15.5)
WBC: 6 10*3/uL (ref 4.0–10.5)

## 2017-04-27 LAB — PROTIME-INR
INR: 1.69
PROTHROMBIN TIME: 20.1 s — AB (ref 11.4–15.2)

## 2017-04-27 LAB — BASIC METABOLIC PANEL
Anion gap: 14 (ref 5–15)
BUN: 93 mg/dL — AB (ref 6–20)
CALCIUM: 9.1 mg/dL (ref 8.9–10.3)
CHLORIDE: 89 mmol/L — AB (ref 101–111)
CO2: 27 mmol/L (ref 22–32)
CREATININE: 3.18 mg/dL — AB (ref 0.61–1.24)
GFR calc non Af Amer: 19 mL/min — ABNORMAL LOW (ref >60)
GFR, EST AFRICAN AMERICAN: 23 mL/min — AB (ref >60)
Glucose, Bld: 194 mg/dL — ABNORMAL HIGH (ref 65–99)
Potassium: 3 mmol/L — ABNORMAL LOW (ref 3.5–5.1)
SODIUM: 130 mmol/L — AB (ref 135–145)

## 2017-04-27 LAB — TYPE AND SCREEN
ABO/RH(D): O POS
Antibody Screen: NEGATIVE

## 2017-04-27 LAB — MAGNESIUM: MAGNESIUM: 2.5 mg/dL — AB (ref 1.7–2.4)

## 2017-04-27 MED ORDER — ATORVASTATIN CALCIUM 40 MG PO TABS
40.0000 mg | ORAL_TABLET | Freq: Every day | ORAL | Status: DC
  Administered 2017-04-27 – 2017-04-28 (×2): 40 mg via ORAL
  Filled 2017-04-27 (×2): qty 1

## 2017-04-27 MED ORDER — PANTOPRAZOLE SODIUM 40 MG IV SOLR
40.0000 mg | Freq: Two times a day (BID) | INTRAVENOUS | Status: DC
  Administered 2017-04-27 – 2017-04-28 (×2): 40 mg via INTRAVENOUS
  Filled 2017-04-27 (×2): qty 40

## 2017-04-27 MED ORDER — INSULIN GLARGINE 100 UNIT/ML SOLOSTAR PEN: 15.0000 [IU] | PEN_INJECTOR | Freq: Every day | SUBCUTANEOUS | Status: DC

## 2017-04-27 MED ORDER — LEVOTHYROXINE SODIUM 25 MCG PO TABS
25.0000 ug | ORAL_TABLET | Freq: Every day | ORAL | Status: DC
  Administered 2017-04-28: 25 ug via ORAL
  Filled 2017-04-27: qty 1

## 2017-04-27 MED ORDER — PANTOPRAZOLE SODIUM 40 MG PO TBEC: 40.0000 mg | DELAYED_RELEASE_TABLET | Freq: Every day | ORAL | Status: DC

## 2017-04-27 MED ORDER — SODIUM CHLORIDE 0.9% FLUSH
3.0000 mL | Freq: Two times a day (BID) | INTRAVENOUS | Status: DC
  Administered 2017-04-27 – 2017-04-28 (×2): 3 mL via INTRAVENOUS

## 2017-04-27 MED ORDER — ACETAMINOPHEN 650 MG RE SUPP: 650.0000 mg | Freq: Four times a day (QID) | RECTAL | Status: DC | PRN

## 2017-04-27 MED ORDER — ALBUTEROL SULFATE (2.5 MG/3ML) 0.083% IN NEBU: 3.0000 mL | INHALATION_SOLUTION | Freq: Four times a day (QID) | RESPIRATORY_TRACT | Status: DC | PRN

## 2017-04-27 MED ORDER — POLYETHYLENE GLYCOL 3350 17 G PO PACK: 17.0000 g | PACK | Freq: Every day | ORAL | Status: DC | PRN

## 2017-04-27 MED ORDER — METOPROLOL SUCCINATE 12.5 MG HALF TABLET
12.5000 mg | ORAL_TABLET | Freq: Two times a day (BID) | ORAL | Status: DC
  Administered 2017-04-27: 12.5 mg via ORAL
  Filled 2017-04-27 (×2): qty 1

## 2017-04-27 MED ORDER — ACETAMINOPHEN 325 MG PO TABS: 650.0000 mg | ORAL_TABLET | Freq: Four times a day (QID) | ORAL | Status: DC | PRN

## 2017-04-27 MED ORDER — ONDANSETRON HCL 4 MG PO TABS: 4.0000 mg | ORAL_TABLET | Freq: Four times a day (QID) | ORAL | Status: DC | PRN

## 2017-04-27 MED ORDER — POTASSIUM CHLORIDE CRYS ER 20 MEQ PO TBCR: 20.0000 meq | EXTENDED_RELEASE_TABLET | Freq: Every day | ORAL | Status: DC

## 2017-04-27 MED ORDER — POTASSIUM CHLORIDE CRYS ER 20 MEQ PO TBCR
40.0000 meq | EXTENDED_RELEASE_TABLET | Freq: Once | ORAL | Status: AC
Start: 2017-04-27 — End: 2017-04-27
  Administered 2017-04-27: 40 meq via ORAL
  Filled 2017-04-27: qty 2

## 2017-04-27 MED ORDER — COQ10 50 MG PO CAPS: 1.0000 | ORAL_CAPSULE | Freq: Every day | ORAL | Status: DC

## 2017-04-27 MED ORDER — INSULIN ASPART 100 UNIT/ML ~~LOC~~ SOLN
0.0000 [IU] | SUBCUTANEOUS | Status: DC
  Administered 2017-04-27: 2 [IU] via SUBCUTANEOUS
  Administered 2017-04-27: 3 [IU] via SUBCUTANEOUS
  Administered 2017-04-28: 2 [IU] via SUBCUTANEOUS
  Administered 2017-04-28: 1 [IU] via SUBCUTANEOUS

## 2017-04-27 MED ORDER — ONDANSETRON HCL 4 MG/2ML IJ SOLN: 4.0000 mg | Freq: Four times a day (QID) | INTRAMUSCULAR | Status: DC | PRN

## 2017-04-27 MED ORDER — TORSEMIDE 20 MG PO TABS: 40.0000 mg | ORAL_TABLET | Freq: Every day | ORAL | Status: DC

## 2017-04-27 NOTE — ED Notes (Signed)
Attempted report 

## 2017-04-27 NOTE — Consult Note (Signed)
[facility] Surgery Consult Note  [name] January 26, 1956  [id number].    Requesting MD: Lynelle Doctor Chief Complaint/Reason for Consult: Bleeding around ostomy site  HPI:  Patient presented to [facility] with bleeding around colostomy site. Colostomy done in 2008 by Dr. Derrell Lolling. Patient on Eliquis. Patient states he has had intermittent bleeding since Saturday. Was seen by Dr. Lindie Spruce in the [facility] office 5/21 and had bleeding site cauterized with silver nitrate. It was discussed at that time that patient may need to be switched from Eliquis to Coumadin. Patient experiencing some dizziness and has had one near syncopal event. Still having output from stoma, stool is soft and not melenotic or bloody. Denies N/V, abdominal pain, recent fever, SOB, chest pain.   ROS: Review of Systems  Constitutional: Negative for chills and fever.  Respiratory: Negative for shortness of breath and wheezing.   Cardiovascular: Positive for leg swelling. Negative for chest pain and palpitations.  Gastrointestinal: Negative for abdominal pain, blood in stool, constipation, diarrhea, melena, nausea and vomiting.       Bleeding around ostomy site  Skin: Negative for itching and rash.  Neurological: Positive for dizziness. Negative for headaches. Loss of consciousness: Near syncopal event.  All other systems reviewed and are negative.   Family History  Problem Relation Age of Onset  . Colon cancer Mother 67  . Coronary artery disease Father   . Colon cancer Sister 42  . Diabetes Sister   . Colon cancer Other        2 cousins, succumbed to illness    Past Medical History:  Diagnosis Date  . Adenocarcinoma of rectum (HCC)    a. 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 AICD (automatic cardioverter/defibrillator) present 2002   BI V ICD  . Anemia   . CAD (coronary artery disease)    a. BMS to LAD 2001 at [facility] b. PTCA/atherectomy ramus and BMS to LAD 2009  . CHF (congestive heart failure) (HCC)   . Cholelithiasis 06/2015  . Chronic  kidney disease, stage IV (severe) (HCC)   . Chronic systolic heart failure (HCC)   . Colostomy in place [facility])   . Dizziness    a. chronic. Admission for this 07/18/2014  . DM type 2, uncontrolled, with renal complications (HCC)   . Dysrhythmia   . Essential hypertension, benign   . GERD (gastroesophageal reflux disease)   . HCAP (healthcare-associated pneumonia) 07/21/2015  . Hematuria   . History of blood transfusion    "I've had 2 units so far this year" (09/27/2015)  . HLD (hyperlipidemia)   . Ischemic cardiomyopathy    EF 18% by nuclear study 2016, multiple myocardial infarctions in past    . Myocardial infarction (HCC) 2001  . Obesity   . Orthostatic hypotension   . OSA on CPAP   . Paroxysmal atrial fibrillation (HCC)    a. on amiodarone, digoxin and Eliquis  . PONV (postoperative nausea and vomiting)   . Prostate cancer (HCC)    a. s/p seed implants with chemo and radiation  . SAH (subarachnoid hemorrhage) (HCC)    post-traumatic (fall) [facility] 12/2014  . TIA (transient ischemic attack)     Past Surgical History:  Procedure Laterality Date  . Abdominal and Perineal Resection of Rectum with Total Mesorectal Excision  10/04/2007  . AV FISTULA PLACEMENT Right 09/16/2015   Procedure: ARTERIOVENOUS (AV) FISTULA CREATION - BRACHIOCEPHALIC;  Surgeon: Sherren Kerns, MD;  Location: [facility] OR;  Service: Vascular;  Laterality: Right;  . BI-VENTRICULAR IMPLANTABLE CARDIOVERTER DEFIBRILLATOR  (CRT-D)  2009  . CARDIAC CATHETERIZATION  08/2001; 2009   ; /notes 07/10/2013  . CARDIAC DEFIBRILLATOR PLACEMENT  2002  . CARDIAC DEFIBRILLATOR PLACEMENT  2009   Upgraded to a BiV ICD  . COLONOSCOPY  09/14/2011   Dr. Gala Romney: via colostomy, Single pedunculated benign inflammatory polyp. Due for surveillance Oct 2015  . COLONOSCOPY N/A 07/02/2014   Procedure: COLONOSCOPY;  Surgeon: Daneil Dolin, MD;  [facility];  Service: Endoscopy;  Laterality: N/A;  7:30 / COLONOSCOPY THRU COLOSTOMY  .  COLONOSCOPY N/A 12/11/2014   Dr. Gala Romney via colostomy. Normal. Repeat in 2021.   Marland Kitchen COLONOSCOPY N/A 08/24/2015   Dr. Havery Moros: diminutive polyp not removed, stoma site of bleeding  . COLOSTOMY  2008  . CORONARY ANGIOPLASTY WITH STENT PLACEMENT  2001; ~ 2006   "1 + 1"   . ELECTROPHYSIOLOGIC STUDY N/A 08/28/2015   Procedure: AV Node Ablation;  Surgeon: Will Meredith Leeds, MD;  Location: [place] CV LAB;  Service: Cardiovascular;  Laterality: N/A;  . EP IMPLANTABLE DEVICE N/A 04/10/2015   Procedure: Ppm/Biv Ppm Generator Changeout;  Surgeon: Evans Lance, MD;  Location: [place] INVASIVE CV LAB CUPID;  Service: Cardiovascular;  Laterality: N/A;  . ERCP N/A 06/11/2016   Procedure: ENDOSCOPIC RETROGRADE CHOLANGIOPANCREATOGRAPHY (ERCP);  Surgeon: Teena Irani, MD;  Location: [facility] ENDOSCOPY;  Service: Endoscopy;  Laterality: N/A;  . ERCP N/A 01/26/2017   Procedure: ENDOSCOPIC RETROGRADE CHOLANGIOPANCREATOGRAPHY (ERCP);  Surgeon: Teena Irani, MD;  Location: [facility] ENDOSCOPY;  Service: Endoscopy;  Laterality: N/A;  . ERCP  06/2016   Dr. Amedeo Plenty: duodenal fistula, dilated bile duct but no obvious stones, s/p sphincterotomy and stent placement  . ERCP  01/2017   Dr. Amedeo Plenty: CBD stones, s/p sphincterotomy, balloon extraction, stent removal  . ESOPHAGOGASTRODUODENOSCOPY N/A 07/02/2014   Procedure: ESOPHAGOGASTRODUODENOSCOPY (EGD);  Surgeon: Daneil Dolin, MD;  [facility];  Service: Endoscopy;  Laterality: N/A;  7:30  . ESOPHAGOGASTRODUODENOSCOPY N/A 12/11/2014   ZOX:WRUEAV EGD  . ESOPHAGOGASTRODUODENOSCOPY (EGD) WITH PROPOFOL N/A 04/18/2017   Procedure: ESOPHAGOGASTRODUODENOSCOPY (EGD) WITH PROPOFOL;  Surgeon: Daneil Dolin, MD;  [facility];  Service: Endoscopy;  Laterality: N/A;  2:30pm  . GASTROINTESTINAL STENT REMOVAL N/A 01/26/2017   Procedure: GASTROINTESTINAL STENT REMOVAL;  Surgeon: Teena Irani, MD;  Location: [place];  Service: Endoscopy;  Laterality: N/A;  . GIVENS CAPSULE STUDY N/A  07/23/2015   Dr. Michail Sermon: small non-bleeding AVM, mild gastritis  . LEFT HEART CATHETERIZATION WITH CORONARY ANGIOGRAM N/A 07/13/2013   Procedure: LEFT HEART CATHETERIZATION WITH CORONARY ANGIOGRAM;  Surgeon: Lorretta Harp, MD;  Location: [facility] CATH LAB;  Service: Cardiovascular;  Laterality: N/A;  . Venia Minks DILATION N/A 07/02/2014   Procedure: Venia Minks DILATION;  Surgeon: Daneil Dolin, MD;  [facility];  Service: Endoscopy;  Laterality: N/A;  7:30  . MALONEY DILATION N/A 04/18/2017   Procedure: Venia Minks DILATION;  Surgeon: Daneil Dolin, MD;  [facility];  Service: Endoscopy;  Laterality: N/A;  . PORTACATH PLACEMENT  06/2007   "removed ~ 1 yr later"  . RIGHT HEART CATHETERIZATION N/A 02/24/2015   Procedure: RIGHT HEART CATH;  Surgeon: Jolaine Artist, MD;  Location[facility] CATH LAB;  Service: Cardiovascular;  Laterality: N/A;  . SAVORY DILATION N/A 07/02/2014   Procedure: SAVORY DILATION;  Surgeon: Daneil Dolin, MD;  [facility];  Service: Endoscopy;  Laterality: N/A;  7:30    Social History:  reports that he has never smoked. He has never used  smokeless tobacco. He reports that he does not drink alcohol or use drugs.  Allergies: No Known Allergies   (Not in a hospital admission)  Blood pressure 105/68, pulse 69, temperature 97.5 F (36.4 C), temperature source Oral, resp. rate 18, height 5\' 10"  (1.778 m), weight 81.6 kg (180 lb), SpO2 100 %. Physical Exam: Physical Exam  Constitutional: He is oriented to person, place, and time. He appears well-developed and well-nourished. He is cooperative.  Non-toxic appearance. No distress.  HENT:  Head: Normocephalic and atraumatic.  Right Ear: External ear normal.  Left Ear: External ear normal.  Nose: Nose normal.  Mouth/Throat: Oropharynx is clear and moist and mucous membranes are normal.  Eyes: Conjunctivae and lids are normal. No scleral icterus.  Neck: Trachea normal and normal range of motion. Neck  supple.  Cardiovascular:  Pulses:      Radial pulses are 2+ on the right side, and 2+ on the left side.       Dorsalis pedis pulses are 0 on the right side, and 0 on the left side.  HR paced with occasional PVCs  Pulmonary/Chest: Effort normal and breath sounds normal. He has no wheezes. He has no rhonchi. He has no rales.  Abdominal: Soft. Bowel sounds are normal. He exhibits no shifting dullness and no distension. There is no hepatosplenomegaly. There is no tenderness.  Ostomy in LUQ, with oozing blood around inferior perimeter.  Musculoskeletal: Normal range of motion.  No gross deformity noted  Neurological: He is alert and oriented to person, place, and time. No cranial nerve deficit or sensory deficit.  Skin: Skin is warm and dry. Bruising noted.  Small venous stasis ulcer on left anterior shin  Psychiatric: He has a normal mood and affect. His speech is normal and behavior is normal. Judgment and thought content normal. Cognition and memory are normal.    Results for orders placed or performed during the hospital encounter of 04/27/17 (from the past 48 hour(s))  Comprehensive metabolic panel     Status: Abnormal   Collection Time: 04/27/17 11:27 AM  Result Value Ref Range   Sodium 132 (L) 135 - 145 mmol/L   Potassium 2.8 (L) 3.5 - 5.1 mmol/L   Chloride 90 (L) 101 - 111 mmol/L   CO2 29 22 - 32 mmol/L   Glucose, Bld 224 (H) 65 - 99 mg/dL   BUN 94 (H) 6 - 20 mg/dL   Creatinine, Ser 04/29/17 (H) 0.61 - 1.24 mg/dL   Calcium 9.2 8.9 - 8.59 mg/dL   Total Protein 7.1 6.5 - 8.1 g/dL   Albumin 3.0 (L) 3.5 - 5.0 g/dL   AST 28 15 - 41 U/L   ALT 19 17 - 63 U/L   Alkaline Phosphatase 94 38 - 126 U/L   Total Bilirubin 2.0 (H) 0.3 - 1.2 mg/dL   GFR calc non Af Amer 19 (L) >60 mL/min   GFR calc Af Amer 22 (L) >60 mL/min    Comment: (NOTE) The eGFR has been calculated using the CKD EPI equation. This calculation has not been validated in all clinical situations. eGFR's persistently <60  mL/min signify possible Chronic Kidney Disease.    Anion gap 13 5 - 15  CBC     Status: Abnormal   Collection Time: 04/27/17 11:27 AM  Result Value Ref Range   WBC 6.0 4.0 - 10.5 K/uL   RBC 3.14 (L) 4.22 - 5.81 MIL/uL   Hemoglobin 9.8 (L) 13.0 - 17.0 g/dL    Comment: RESULT  REPEATED AND VERIFIED   HCT 29.3 (L) 39.0 - 52.0 %   MCV 93.3 78.0 - 100.0 fL   MCH 31.2 26.0 - 34.0 pg   MCHC 33.4 30.0 - 36.0 g/dL   RDW 16.7 (H) 11.5 - 15.5 %   Platelets 103 (L) 150 - 400 K/uL    Comment: REPEATED TO VERIFY SPECIMEN CHECKED FOR CLOTS PLATELET COUNT CONFIRMED BY SMEAR   Type and screen [place]     Status: None   Collection Time: 04/27/17 11:30 AM  Result Value Ref Range   ABO/RH(D) O POS    Antibody Screen NEG    Sample Expiration 04/30/2017      Assessment/Plan Bleeding from ostomy site Acute blood loss anemia Patient is being admitted to internal medicine service for multiple medical problems and continued bleeding on Eliquis.  Stoma is pink and viable. Recommend holding Eliquis, applying direct pressure and ice PRN to help stop bleeding. No surgical intervention necessary at this time, but we will continue to follow. Have asked WOC to be involved as well.   Brigid Re, [facility] Surgery 04/27/2017, 3:34 PM Pager: [phone] Consults: [phone] Mon-Fri 7:00 am-4:30 pm Sat-Sun 7:00 am-11:30 am

## 2017-04-27 NOTE — ED Triage Notes (Addendum)
Per [facility], Pt is coming from home with complaints of bleeding noted at the site of Ostomy starting on Saturday. The bleeding has continued until today. Pt has Hx of being on Eliquis. Pt reports a stream of pulsing blood coming from the start of ostomy.  Vitals per EMS: 214 CBG, 72 HR, 108/64, 100% on RA

## 2017-04-27 NOTE — H&P (Addendum)
[facility] History and Physical   Patient: [name] YNW:[id number]   PCP: Redmond School, MD DOB: 1956/01/03   DOA: 04/27/2017   DOS: 04/27/2017   DOS: the patient was seen and examined on 04/27/2017  Patient coming from: The patient is coming from home.  Chief Complaint: bleeding from stoma  HPI: [name] is a 61 y.o. male with Past medical history of Adenocarcinoma of rectum aspirate colostomy, chronic systolic CHF S/P AICD, A. fib S/P ablation, CAD, CKD IV, chronic anticoagulation, recurrent bleeding from stoma, HTN. Patient presented with complaints of bleeding from the stoma ongoing since Sunday. Patient has history of recurrent bleeding from this stoma has been evaluated by GI and surgery in the past. Has been on Eliquis for at least 4 years. No recent change in the medications reported. Recently underwent EGD with esophageal dilatation where he was found to have portal hypertensive gastropathy. Also seen by cardiology and recommended to continue current medication. Did mention that he could have occasional intermittent bleeding from the stoma. Since Sunday the patient started having sporadic spurts of bright red blood bleeding from the stoma filling up his bed. He mention about 3-4 episodes like this since Sunday. Denies having any abdominal pain nausea or vomiting. He was seen by surgery office where he was locally treated with silver nitrate and his bleeding stopped. Surgery actually recommended to change his Eliquis to Coumadin. Improve his chances of reversal should he have any bleeding. He complains about shortness of breath as well as dizziness and fatigue. No chest pain or abdominal pain. No nausea no vomiting. No fever no chills. No cough.  ED Course: Surgery was consulted, patient was recommended to treat for admission due to significant drop in hemoglobin  At his baseline ambulates without any support And is independent for most of his ADL; manages his  medication on his own.  Review of Systems: as mentioned in the history of present illness.  All other systems reviewed and are negative.  Past Medical History:  Diagnosis Date  . Adenocarcinoma of rectum ([place])    a. 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 AICD (automatic cardioverter/defibrillator) present 2002   BI V ICD  . Anemia   . CAD (coronary artery disease)    a. BMS to LAD 2001 at [facility] b. PTCA/atherectomy ramus and BMS to LAD 2009  . CHF (congestive heart failure) ([place])   . Cholelithiasis 06/2015  . Chronic kidney disease, stage IV (severe) ([place])   . Chronic systolic heart failure ([place])   . Colostomy in place [facility])   . Dizziness    a. chronic. Admission for this 07/18/2014  . DM type 2, uncontrolled, with renal complications ([place])   . Dysrhythmia   . Essential hypertension, benign   . GERD (gastroesophageal reflux disease)   . HCAP (healthcare-associated pneumonia) 07/21/2015  . Hematuria   . History of blood transfusion    "I've had 2 units so far this year" (09/27/2015)  . HLD (hyperlipidemia)   . Ischemic cardiomyopathy    EF 18% by nuclear study 2016, multiple myocardial infarctions in past    . Myocardial infarction ([place]) 2001  . Obesity   . Orthostatic hypotension   . OSA on CPAP   . Paroxysmal atrial fibrillation (HCC)    a. on amiodarone, digoxin and Eliquis  . PONV (postoperative nausea and vomiting)   . Prostate cancer ([place])    a. s/p seed implants with chemo and radiation  . SAH (subarachnoid hemorrhage) (HCC)    post-traumatic (  fall) [facility] 12/2014  . TIA (transient ischemic attack)    Past Surgical History:  Procedure Laterality Date  . Abdominal and Perineal Resection of Rectum with Total Mesorectal Excision  10/04/2007  . AV FISTULA PLACEMENT Right 09/16/2015   Procedure: ARTERIOVENOUS (AV) FISTULA CREATION - BRACHIOCEPHALIC;  Surgeon: Elam Dutch, MD;  Location: [place];  Service: Vascular;  Laterality: Right;  . BI-VENTRICULAR IMPLANTABLE CARDIOVERTER DEFIBRILLATOR   (CRT-D)  2009  . CARDIAC CATHETERIZATION  08/2001; 2009   ; [name] 07/10/2013  . CARDIAC DEFIBRILLATOR PLACEMENT  2002  . CARDIAC DEFIBRILLATOR PLACEMENT  2009   Upgraded to a BiV ICD  . COLONOSCOPY  09/14/2011   Dr. Gala Romney: via colostomy, Single pedunculated benign inflammatory polyp. Due for surveillance Oct 2015  . COLONOSCOPY N/A 07/02/2014   Procedure: COLONOSCOPY;  Surgeon: Daneil Dolin, MD;  [facility];  Service: Endoscopy;  Laterality: N/A;  7:30 / COLONOSCOPY THRU COLOSTOMY  . COLONOSCOPY N/A 12/11/2014   Dr. Gala Romney via colostomy. Normal. Repeat in 2021.   Marland Kitchen COLONOSCOPY N/A 08/24/2015   Dr. Havery Moros: diminutive polyp not removed, stoma site of bleeding  . COLOSTOMY  2008  . CORONARY ANGIOPLASTY WITH STENT PLACEMENT  2001; ~ 2006   "1 + 1"   . ELECTROPHYSIOLOGIC STUDY N/A 08/28/2015   Procedure: AV Node Ablation;  Surgeon: Will Meredith Leeds, MD;  Location: [place] CV LAB;  Service: Cardiovascular;  Laterality: N/A;  . EP IMPLANTABLE DEVICE N/A 04/10/2015   Procedure: Ppm/Biv Ppm Generator Changeout;  Surgeon: Evans Lance, MD;  Location: [place] INVASIVE CV LAB CUPID;  Service: Cardiovascular;  Laterality: N/A;  . ERCP N/A 06/11/2016   Procedure: ENDOSCOPIC RETROGRADE CHOLANGIOPANCREATOGRAPHY (ERCP);  Surgeon: Teena Irani, MD;  Location[facility] ENDOSCOPY;  Service: Endoscopy;  Laterality: N/A;  . ERCP N/A 01/26/2017   Procedure: ENDOSCOPIC RETROGRADE CHOLANGIOPANCREATOGRAPHY (ERCP);  Surgeon: Teena Irani, MD;  Location: [facility] ENDOSCOPY;  Service: Endoscopy;  Laterality: N/A;  . ERCP  06/2016   Dr. Amedeo Plenty: duodenal fistula, dilated bile duct but no obvious stones, s/p sphincterotomy and stent placement  . ERCP  01/2017   Dr. Amedeo Plenty: CBD stones, s/p sphincterotomy, balloon extraction, stent removal  . ESOPHAGOGASTRODUODENOSCOPY N/A 07/02/2014   Procedure: ESOPHAGOGASTRODUODENOSCOPY (EGD);  Surgeon: Daneil Dolin, MD;  [facility];  Service: Endoscopy;  Laterality: N/A;  7:30    . ESOPHAGOGASTRODUODENOSCOPY N/A 12/11/2014   PYP:[id number] EGD  . ESOPHAGOGASTRODUODENOSCOPY (EGD) WITH PROPOFOL N/A 04/18/2017   Procedure: ESOPHAGOGASTRODUODENOSCOPY (EGD) WITH PROPOFOL;  Surgeon: Daneil Dolin, MD;  [facility];  Service: Endoscopy;  Laterality: N/A;  2:30pm  . GASTROINTESTINAL STENT REMOVAL N/A 01/26/2017   Procedure: GASTROINTESTINAL STENT REMOVAL;  Surgeon: Teena Irani, MD;  Location: [place];  Service: Endoscopy;  Laterality: N/A;  . GIVENS CAPSULE STUDY N/A 07/23/2015   Dr. Michail Sermon: small non-bleeding AVM, mild gastritis  . LEFT HEART CATHETERIZATION WITH CORONARY ANGIOGRAM N/A 07/13/2013   Procedure: LEFT HEART CATHETERIZATION WITH CORONARY ANGIOGRAM;  Surgeon: Lorretta Harp, MD;  Location: [facility] CATH LAB;  Service: Cardiovascular;  Laterality: N/A;  . Venia Minks DILATION N/A 07/02/2014   Procedure: Venia Minks DILATION;  Surgeon: Daneil Dolin, MD;  [facility];  Service: Endoscopy;  Laterality: N/A;  7:30  . MALONEY DILATION N/A 04/18/2017   Procedure: Venia Minks DILATION;  Surgeon: Daneil Dolin, MD;  [facility];  Service: Endoscopy;  Laterality: N/A;  . PORTACATH PLACEMENT  06/2007   "removed ~ 1 yr later"  .  RIGHT HEART CATHETERIZATION N/A 02/24/2015   Procedure: RIGHT HEART CATH;  Surgeon: Jolaine Artist, MD;  Location[facility] CATH LAB;  Service: Cardiovascular;  Laterality: N/A;  . SAVORY DILATION N/A 07/02/2014   Procedure: SAVORY DILATION;  Surgeon: Daneil Dolin, MD;  [facility];  Service: Endoscopy;  Laterality: N/A;  7:30   Social History:  reports that he has never smoked. He has never used smokeless tobacco. He reports that he does not drink alcohol or use drugs.  No Known Allergies  Family History  Problem Relation Age of Onset  . Colon cancer Mother 43  . Coronary artery disease Father   . Colon cancer Sister 17  . Diabetes Sister   . Colon cancer Other        2 cousins, succumbed to illness    Prior  to Admission medications   Medication Sig Start Date End Date Taking? Authorizing Provider  apixaban (ELIQUIS) 5 MG TABS tablet Take 1 tablet (5 mg total) by mouth 2 (two) times daily. 09/20/16   Bensimhon, Shaune Pascal, MD  atorvastatin (LIPITOR) 40 MG tablet Take 1 tablet (40 mg total) by mouth daily. 02/15/17 05/16/17  Bensimhon, Shaune Pascal, MD  Coenzyme Q10 (COQ10) 50 MG CAPS Take 1 capsule by mouth daily.    [provider]  glucose blood test strip Use to test blood sugar 3 times daily 03/23/17   Elayne Snare, MD  insulin aspart (NOVOLOG FLEXPEN) 100 UNIT/ML FlexPen 5 units before each meal Patient taking differently: Inject 5 Units into the skin 3 (three) times daily with meals. 5 units before each meal 03/23/17   Elayne Snare, MD  Insulin Glargine (LANTUS SOLOSTAR) 100 UNIT/ML Solostar Pen Inject 15 Units into the skin daily at 10 pm. 04/13/17   Elayne Snare, MD  levothyroxine (SYNTHROID, LEVOTHROID) 25 MCG tablet Take 25 mcg by mouth daily before breakfast.    [provider]  metolazone (ZAROXOLYN) 2.5 MG tablet take 1 tablet by mouth weekly if needed for SWELLING 04/19/17   Arnoldo Lenis, MD  metoprolol succinate (TOPROL-XL) 25 MG 24 hr tablet take 1/2 tablet by mouth twice a day 01/03/17   Bensimhon, Shaune Pascal, MD  nitroGLYCERIN (NITROLINGUAL) 0.4 MG/SPRAY spray Place 1 spray under the tongue every 5 (five) minutes x 3 doses as needed for chest pain. Reported on 06/01/2016    [provider]  pantoprazole (PROTONIX) 40 MG tablet Take 1 tablet (40 mg total) by mouth daily. 05/07/16   Shirley Friar, PA-C  potassium chloride SA (K-DUR,KLOR-CON) 20 MEQ tablet Take 20 mEq by mouth daily.    [provider]  PROAIR RESPICLICK 371 (90 BASE) MCG/ACT AEPB Inhale 2 puffs into the lungs every 6 (six) hours as needed (for breathing).  08/15/15   [provider]  torsemide (DEMADEX) 20 MG tablet Take 80 mg (4 tabs) in am and 40 mg (2 tabs) in pm 02/24/17    Bensimhon, Shaune Pascal, MD   Physical Exam: Vitals:   04/27/17 1119 04/27/17 1437 04/27/17 1500  BP: (!) 103/52 105/64 105/68  Pulse: 70 70 69  Resp: 19 (!) 22 18  Temp: 97.5 F (36.4 C)    TempSrc: Oral    SpO2: 100% 98% 100%  Weight: 81.6 kg (180 lb)    Height: 5\' 10"  (1.778 m)     General: Alert, Awake and Oriented to Time, Place and Person. Appear in mild distress, affect appropriate Eyes: PERRL, Conjunctiva normal ENT: Oral Mucosa clear moist.  Neck: no JVD, no Abnormal Mass Or lumps Cardiovascular: S1 and S2 Present, aortic systolic Murmur, Peripheral Pulses faint in both leg. Respiratory: Bilateral Air entry equal and Decreased, no use of accessory muscle, Clear to Auscultation, no Crackles, no wheezes Abdomen: Bowel Sound present, Soft and no tenderness, mild oozing from stoma Skin: no redness, no Rash, no induration, chronic left leg ulcer Extremities: no Pedal edema, no calf tenderness Neurologic: Grossly no focal neuro deficit. Bilaterally Equal motor strength  Labs on Admission:  CBC:  Recent Labs Lab 04/27/17 1127  WBC 6.0  HGB 9.8*  HCT 29.3*  MCV 93.3  PLT 376*   Basic Metabolic Panel:  Recent Labs Lab 04/27/17 1127  NA 132*  K 2.8*  CL 90*  CO2 29  GLUCOSE 224*  BUN 94*  CREATININE 3.20*  CALCIUM 9.2   GFR: Estimated Creatinine Clearance: 24.7 mL/min (A) (by C-G formula based on SCr of 3.2 mg/dL (H)). Liver Function Tests:  Recent Labs Lab 04/27/17 1127  AST 28  ALT 19  ALKPHOS 94  BILITOT 2.0*  PROT 7.1  ALBUMIN 3.0*   No results for input(s): LIPASE, AMYLASE in the last 168 hours. No results for input(s): AMMONIA in the last 168 hours. Coagulation Profile:  Recent Labs Lab 04/27/17 1530  INR 1.69   Urine analysis:    Component Value Date/Time   COLORURINE YELLOW 06/09/2016 1023   APPEARANCEUR CLEAR 06/09/2016 1023   LABSPEC 1.017 06/09/2016 1023   PHURINE 5.5 06/09/2016 1023   GLUCOSEU >1000 (A) 06/09/2016 1023   HGBUR  NEGATIVE 06/09/2016 1023   BILIRUBINUR NEGATIVE 06/09/2016 1023   KETONESUR NEGATIVE 06/09/2016 1023   PROTEINUR NEGATIVE 06/09/2016 1023   UROBILINOGEN 1.0 08/23/2015 0322   NITRITE NEGATIVE 06/09/2016 1023   LEUKOCYTESUR NEGATIVE 06/09/2016 1023    Radiological Exams on Admission: No results found. Assessment/Plan 1. Bleeding from colostomy stoma [facility])    History of rectal cancer   Anemia of chronic disease   Hepatic cirrhosis (HCC)   GI bleed   Portal hypertensive gastropathy (HCC)   GERD (gastroesophageal reflux disease) Presented with fatigue, shortness of breath, dizziness as well as episodes of sudden spurting of blood in colostomy. Currently has mild oozing from stoma, no active bleeding reported. No abdominal pain no nausea no vomiting no fever no chills. Hemoglobin dropped from 13-9.8. Recent EGD showed portal hypertensive gastropathy. Colonoscopy last year for similar presentation as well as capsule was unremarkable. At this point the patient will be admitted for observation, will transfuse 1 PRBC should his hemoglobin dropped further. Currently continue monitoring H&H every 6 hours. Hold Eliquis. General surgery consulted, appreciate input, does not feel that the patient would benefit from any surgical suture clipping as no active bleeding from a blood vessel reported. Surgery Recommended to consider changing his Eliquis to Coumadin, will discuss with cardiology. She is on IV Protonix for history of portal hypertensive gastropathy.   2.  Hypokalemia We will replace and recheck. Check magnesium.  3. CAD with LCX disease and stents to LAD   Chronic systolic heart failure (HCC)   BiV ICD (St Jude) gen change 04/10/15 Currently reducing his torsemide from 80 mg and 40 mg dose to 40 mg daily dose. Also reducing potassium for the same reason. Strict in and out and daily weight. No evidence of active exacerbation. Euvolemic.  4. Chronic kidney disease, stage IV (severe)  (HCC) Mild worsening of his baseline renal function. Denies any decrease in urination. We will continue to hold his  torsemide and monitor renal function, it does not improve, may require further reduction torsemide dose. Also due to renal function patient may be a better candidate for Coumadin rather than Eliquis.   5. DM type 2, uncontrolled, with renal complications (HCC) Holding long-acting insulin. Placing the patient on sensitive sliding scale every 4 hours as the patient is remaining on clear liquid diet.  6.  OSA on CPAP C Pap daily at bedtime.  7. Transaminitis Elevation of serum bilirubin, will need to monitor closely.  8. Nonhealing ulcer of the left lower extremity. Check ABI given the history of CAD.  Nutrition: clear liquid diet DVT Prophylaxis: mechanical compression device  Advance goals of care discussion: full code   Consults: general surgery  Family Communication: family was present at bedside, at the time of interview.  Opportunity was given to ask question and all questions were answered satisfactorily.  Disposition: Admitted as observation telemetry unit. Likely to be discharged home, in 2 days.  Author: Berle Mull, MD [facility] Pager: [phone] 04/27/2017  If 7PM-7AM, please contact night-coverage [email or web address] Password [email or web address]

## 2017-04-27 NOTE — ED Notes (Signed)
[place] surgery at bedside. 

## 2017-04-27 NOTE — ED Provider Notes (Signed)
[facility] DEPT Provider Note   CSN: [id number] Arrival date & time: 04/27/17  1113     History   Chief Complaint Chief Complaint  Patient presents with  . GI Problem    HPI [name] is a 61 y.o. male.  HPI Patient presents to the emergency room for evaluation of bleeding from his ostomy. Patient has a complex medical history. 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  Patient states on Saturday he started noticing blood coming from his ostomy. Patient states it was right below the adhesive dressing portion of the colostomy bag. Patient states she's had intermittent episodes off-and-on for the blood will spurt from that location. He was seen by his surgeon earlier this week.  They wanted the patient to speak with his cardiologist about changing possibly from eliquis to coumadin.   Patient states she's had continued bleeding. He came into the emergency room because of the persistent bleeding. He is also feeling a little bit fatigued. Past Medical History:  Diagnosis Date  . Adenocarcinoma of rectum ([place])    a. 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 AICD (automatic cardioverter/defibrillator) present 2002   BI V ICD  . Anemia   . CAD (coronary artery disease)    a. BMS to LAD 2001 at [facility] b. PTCA/atherectomy ramus and BMS to LAD 2009  . CHF (congestive heart failure) ([place])   . Cholelithiasis 06/2015  . Chronic kidney disease, stage IV (severe) ([place])   . Chronic systolic heart failure ([place])   . Colostomy in place [facility])   . Dizziness    a. chronic. Admission for this 07/18/2014  . DM type 2, uncontrolled, with renal complications ([place])   . Dysrhythmia   . Essential hypertension, benign   . GERD (gastroesophageal reflux disease)   . HCAP (healthcare-associated pneumonia) 07/21/2015  . Hematuria   . History of blood transfusion    "I've had 2 units so far this year" (09/27/2015)  . HLD  (hyperlipidemia)   . Ischemic cardiomyopathy    EF 18% by nuclear study 2016, multiple myocardial infarctions in past    . Myocardial infarction ([place]) 2001  . Obesity   . Orthostatic hypotension   . OSA on CPAP   . Paroxysmal atrial fibrillation (HCC)    a. on amiodarone, digoxin and Eliquis  . PONV (postoperative nausea and vomiting)   . Prostate cancer ([place])    a. s/p seed implants with chemo and radiation  . SAH (subarachnoid hemorrhage) ([place])    post-traumatic (fall) [facility] 12/2014  . TIA (transient ischemic attack)     Patient Active Problem List   Diagnosis Date Noted  . Esophageal dysphagia 04/13/2017  . Hepatic cirrhosis ([place]) 02/24/2017  . Acute on chronic systolic CHF (congestive heart failure) ([place])   . Biliary disease with obstruction   . AKI (acute kidney injury) ([place])   . Cholecystitis 06/09/2016  . Abnormal liver function   . Abnormal LFTs 05/10/2016  . Chest pain 05/10/2016  . Acute cholecystitis 05/10/2016  . Pain in the chest   . CKD (chronic kidney disease) 11/20/2015  . Aftercare following surgery of the circulatory system 11/20/2015  . Right heart failure ([place]) 09/26/2015  . History of colonic polyps 09/24/2015  . Chronic atrial fibrillation ([place])   . Bleeding from colostomy stoma ([place]) 08/23/2015  . Atrial fibrillation, chronic ([place]) 08/07/2015  . Anemia of chronic disease 07/21/2015  . Constipation 05/01/2015  . Transaminitis 05/01/2015  .  NSTEMI- 04/23/15- Myoview scar- no cath 04/27/2015  . Anemia-transfused 2 units May 2016 04/27/2015  . Chronic cholecystitis 04/04/2015  . Cystitis with hematuria-April 2016 04/04/2015  . Chronically elevated transaminase level-Amiodarone resumed 04/24/15 04/03/2015  . H/O ventricular fibrillation-March 2016 02/24/2015  . Cardiogenic shock ([place]) 02/24/2015  . BiV ICD (St Jude) gen change 04/10/15 02/24/2015  . Acute on chronic systolic congestive heart failure ([place])   . Hyperlipidemia   . Dietary noncompliance   .  Orthostatic hypotension 07/19/2014  . OSA on CPAP   . GERD (gastroesophageal reflux disease) 06/05/2014  . Gallbladder polyp 03/06/2014  . DM type 2, uncontrolled, with renal complications ([place]) 54/62/7035  . Dizziness 01/26/2014  . Obesity 12/12/2013  . Chronic hypotension 12/11/2013  . Chronic kidney disease, stage IV (severe) ([place]) 12/11/2013  . Paroxysmal atrial fibrillation (HCC)   . Chronic systolic heart failure ([place]) 08/12/2013  . CAD with LCX disease and stents to LAD 07/10/2013  . Long term current use of anticoagulant therapy 03/23/2013  . History of rectal cancer   . Ischemic cardiomyopathy-EF 35% 04/24/15 echo     Past Surgical History:  Procedure Laterality Date  . Abdominal and Perineal Resection of Rectum with Total Mesorectal Excision  10/04/2007  . AV FISTULA PLACEMENT Right 09/16/2015   Procedure: ARTERIOVENOUS (AV) FISTULA CREATION - BRACHIOCEPHALIC;  Surgeon: Elam Dutch, MD;  Location: [facility];  Service: Vascular;  Laterality: Right;  . BI-VENTRICULAR IMPLANTABLE CARDIOVERTER DEFIBRILLATOR  (CRT-D)  2009  . CARDIAC CATHETERIZATION  08/2001; 2009   ; [name] 07/10/2013  . CARDIAC DEFIBRILLATOR PLACEMENT  2002  . CARDIAC DEFIBRILLATOR PLACEMENT  2009   Upgraded to a BiV ICD  . COLONOSCOPY  09/14/2011   Dr. Gala Romney: via colostomy, Single pedunculated benign inflammatory polyp. Due for surveillance Oct 2015  . COLONOSCOPY N/A 07/02/2014   Procedure: COLONOSCOPY;  Surgeon: Daneil Dolin, MD;  [facility];  Service: Endoscopy;  Laterality: N/A;  7:30 / COLONOSCOPY THRU COLOSTOMY  . COLONOSCOPY N/A 12/11/2014   Dr. Gala Romney via colostomy. Normal. Repeat in 2021.   Marland Kitchen COLONOSCOPY N/A 08/24/2015   Dr. Havery Moros: diminutive polyp not removed, stoma site of bleeding  . COLOSTOMY  2008  . CORONARY ANGIOPLASTY WITH STENT PLACEMENT  2001; ~ 2006   "1 + 1"   . ELECTROPHYSIOLOGIC STUDY N/A 08/28/2015   Procedure: AV Node Ablation;  Surgeon: Will Meredith Leeds, MD;   Location: [facility] CV LAB;  Service: Cardiovascular;  Laterality: N/A;  . EP IMPLANTABLE DEVICE N/A 04/10/2015   Procedure: Ppm/Biv Ppm Generator Changeout;  Surgeon: Evans Lance, MD;  Location: [facility] INVASIVE CV LAB CUPID;  Service: Cardiovascular;  Laterality: N/A;  . ERCP N/A 06/11/2016   Procedure: ENDOSCOPIC RETROGRADE CHOLANGIOPANCREATOGRAPHY (ERCP);  Surgeon: Teena Irani, MD;  Location: [facility] ENDOSCOPY;  Service: Endoscopy;  Laterality: N/A;  . ERCP N/A 01/26/2017   Procedure: ENDOSCOPIC RETROGRADE CHOLANGIOPANCREATOGRAPHY (ERCP);  Surgeon: Teena Irani, MD;  Location: [facility] ENDOSCOPY;  Service: Endoscopy;  Laterality: N/A;  . ERCP  06/2016   Dr. Amedeo Plenty: duodenal fistula, dilated bile duct but no obvious stones, s/p sphincterotomy and stent placement  . ERCP  01/2017   Dr. Amedeo Plenty: CBD stones, s/p sphincterotomy, balloon extraction, stent removal  . ESOPHAGOGASTRODUODENOSCOPY N/A 07/02/2014   Procedure: ESOPHAGOGASTRODUODENOSCOPY (EGD);  Surgeon: Daneil Dolin, MD;  [facility];  Service: Endoscopy;  Laterality: N/A;  7:30  . ESOPHAGOGASTRODUODENOSCOPY N/A 12/11/2014   [id number] EGD  . ESOPHAGOGASTRODUODENOSCOPY (EGD) WITH PROPOFOL N/A 04/18/2017  Procedure: ESOPHAGOGASTRODUODENOSCOPY (EGD) WITH PROPOFOL;  Surgeon: Daneil Dolin, MD;  [facility];  Service: Endoscopy;  Laterality: N/A;  2:30pm  . GASTROINTESTINAL STENT REMOVAL N/A 01/26/2017   Procedure: GASTROINTESTINAL STENT REMOVAL;  Surgeon: Teena Irani, MD;  Location: [facility];  Service: Endoscopy;  Laterality: N/A;  . GIVENS CAPSULE STUDY N/A 07/23/2015   Dr. Michail Sermon: small non-bleeding AVM, mild gastritis  . LEFT HEART CATHETERIZATION WITH CORONARY ANGIOGRAM N/A 07/13/2013   Procedure: LEFT HEART CATHETERIZATION WITH CORONARY ANGIOGRAM;  Surgeon: Lorretta Harp, MD;  Location: [facility] CATH LAB;  Service: Cardiovascular;  Laterality: N/A;  . Venia Minks DILATION N/A 07/02/2014   Procedure: Venia Minks DILATION;  Surgeon: Daneil Dolin, MD;  [facility];  Service: Endoscopy;  Laterality: N/A;  7:30  . MALONEY DILATION N/A 04/18/2017   Procedure: Venia Minks DILATION;  Surgeon: Daneil Dolin, MD;  [facility];  Service: Endoscopy;  Laterality: N/A;  . PORTACATH PLACEMENT  06/2007   "removed ~ 1 yr later"  . RIGHT HEART CATHETERIZATION N/A 02/24/2015   Procedure: RIGHT HEART CATH;  Surgeon: Jolaine Artist, MD;  Location[facility] CATH LAB;  Service: Cardiovascular;  Laterality: N/A;  . SAVORY DILATION N/A 07/02/2014   Procedure: SAVORY DILATION;  Surgeon: Daneil Dolin, MD;  [facility];  Service: Endoscopy;  Laterality: N/A;  7:30       Home Medications    Prior to Admission medications   Medication Sig Start Date End Date Taking? Authorizing Provider  apixaban (ELIQUIS) 5 MG TABS tablet Take 1 tablet (5 mg total) by mouth 2 (two) times daily. 09/20/16   Bensimhon, Shaune Pascal, MD  atorvastatin (LIPITOR) 40 MG tablet Take 1 tablet (40 mg total) by mouth daily. 02/15/17 05/16/17  Bensimhon, Shaune Pascal, MD  Coenzyme Q10 (COQ10) 50 MG CAPS Take 1 capsule by mouth daily.    [provider]  glucose blood test strip Use to test blood sugar 3 times daily 03/23/17   Elayne Snare, MD  insulin aspart (NOVOLOG FLEXPEN) 100 UNIT/ML FlexPen 5 units before each meal Patient taking differently: Inject 5 Units into the skin 3 (three) times daily with meals. 5 units before each meal 03/23/17   Elayne Snare, MD  Insulin Glargine (LANTUS SOLOSTAR) 100 UNIT/ML Solostar Pen Inject 15 Units into the skin daily at 10 pm. 04/13/17   Elayne Snare, MD  levothyroxine (SYNTHROID, LEVOTHROID) 25 MCG tablet Take 25 mcg by mouth daily before breakfast.    [provider]  metolazone (ZAROXOLYN) 2.5 MG tablet take 1 tablet by mouth weekly if needed for SWELLING 04/19/17   Arnoldo Lenis, MD  metoprolol succinate (TOPROL-XL) 25 MG 24 hr tablet take 1/2 tablet by mouth twice a day 01/03/17   Bensimhon, Shaune Pascal,  MD  nitroGLYCERIN (NITROLINGUAL) 0.4 MG/SPRAY spray Place 1 spray under the tongue every 5 (five) minutes x 3 doses as needed for chest pain. Reported on 06/01/2016    [provider]  pantoprazole (PROTONIX) 40 MG tablet Take 1 tablet (40 mg total) by mouth daily. 05/07/16   Shirley Friar, PA-C  potassium chloride SA (K-DUR,KLOR-CON) 20 MEQ tablet Take 20 mEq by mouth daily.    [provider]  PROAIR RESPICLICK 829 (90 BASE) MCG/ACT AEPB Inhale 2 puffs into the lungs every 6 (six) hours as needed (for breathing).  08/15/15   [provider]  torsemide (DEMADEX) 20 MG tablet Take 80 mg (4 tabs) in am and 40  mg (2 tabs) in pm 02/24/17   Bensimhon, Shaune Pascal, MD    Family History Family History  Problem Relation Age of Onset  . Colon cancer Mother 14  . Coronary artery disease Father   . Colon cancer Sister 70  . Diabetes Sister   . Colon cancer Other        2 cousins, succumbed to illness    Social History Social History  Substance Use Topics  . Smoking status: Never Smoker  . Smokeless tobacco: Never Used  . Alcohol use No     Comment: Former user 45 years ago     Allergies   Patient has no known allergies.   Review of Systems Review of Systems  All other systems reviewed and are negative.    Physical Exam Updated Vital Signs BP 105/64 (BP Location: Left Arm)   Pulse 70   Temp 97.5 F (36.4 C) (Oral)   Resp (!) 22   Ht 1.778 m (5\' 10" )   Wt 81.6 kg (180 lb)   SpO2 98%   BMI 25.83 kg/m   Physical Exam  Constitutional: No distress.  Appears older than his age  HENT:  Head: Normocephalic and atraumatic.  Right Ear: External ear normal.  Left Ear: External ear normal.  Eyes: Conjunctivae are normal. Right eye exhibits no discharge. Left eye exhibits no discharge. No scleral icterus.  Neck: Neck supple. No tracheal deviation present.  Cardiovascular: Normal rate, regular rhythm and intact distal pulses.   Pulmonary/Chest: Effort  normal and breath sounds normal. No stridor. No respiratory distress. He has no wheezes. He has no rales.  Abdominal: Soft. Bowel sounds are normal. He exhibits no distension. There is no tenderness. There is no rebound and no guarding.  Brown stool noted coming from his ostomy, there is dried blood around the colostomy site, no active bleeding at this time  Musculoskeletal: He exhibits no edema or tenderness.  Neurological: He is alert. He has normal strength. No cranial nerve deficit (no facial droop, extraocular movements intact, no slurred speech) or sensory deficit. He exhibits normal muscle tone. He displays no seizure activity. Coordination normal.  Skin: Skin is warm and dry. No rash noted. He is not diaphoretic.  Psychiatric: He has a normal mood and affect.  Nursing note and vitals reviewed.    ED Treatments / Results  Labs (all labs ordered are listed, but only abnormal results are displayed) Labs Reviewed  COMPREHENSIVE METABOLIC PANEL - Abnormal; Notable for the following:       Result Value   Sodium 132 (*)    Potassium 2.8 (*)    Chloride 90 (*)    Glucose, Bld 224 (*)    BUN 94 (*)    Creatinine, Ser 3.20 (*)    Albumin 3.0 (*)    Total Bilirubin 2.0 (*)    GFR calc non Af Amer 19 (*)    GFR calc Af Amer 22 (*)    All other components within normal limits  CBC - Abnormal; Notable for the following:    RBC 3.14 (*)    Hemoglobin 9.8 (*)    HCT 29.3 (*)    RDW 16.7 (*)    Platelets 103 (*)    All other components within normal limits  PROTIME-INR  TYPE AND SCREEN    Radiology No results found.  Procedures Procedures (including critical care time)  Medications Ordered in ED Medications  potassium chloride SA (K-DUR,KLOR-CON) CR tablet 40 mEq (not administered)  Initial Impression / Assessment and Plan / ED Course  I have reviewed the triage vital signs and the nursing notes.  Pertinent labs & imaging results that were available during my care of  the patient were reviewed by me and considered in my medical decision making (see chart for details).   the patient's laboratory tests are notable for a drop in his hemoglobin. His hemoglobin is now 9.8. 9 days ago was 13.9. Patient also has worsening renal failure. Creatinine is increased at 3.2. Patient appears hemodynamically stable.     Think he requires a blood transfusion at this point but considering that his hemoglobin has dropped from 13.9-9.8 think he needs to be admitted for observation. She did take his request today.  I would hold future doses.  I will consult with general surgery to have him come and evaluate his ostomy site. I will consult the medical service for admission.  There will need to be further discussion about the patient's long-term anticoagulation  Final Clinical Impressions(s) / ED Diagnoses   Final diagnoses:  Gastrointestinal hemorrhage, unspecified gastrointestinal hemorrhage type  Acute kidney injury ([place])  Hypokalemia  Acute blood loss anemia      Dorie Rank, MD 04/27/17 1512

## 2017-04-27 NOTE — Progress Notes (Signed)
Patient admitted to [facility] from Emergency Room for bleeding from stoma, VSS.  Patient has pressure dressing over ostomy site that is to remain in place for 2 hours per CCS.  On initial exam RN noted left lower leg with venous ulcer, dry, cleansed with saline and foam applied.  WOC consult made.  Left leg also weeps per patient, RN did notice some weeping to anterior and posterior leg (no real edema present), cleansed entire leg with saline and wrapped in Kerlix to keep bed dry.  Right lower leg noted to have 2+ edema, no weeping present.  Right 4th toe noted to have some old dried blood from where patient says he was pulling on a hangnail.   Also noted to have left anterior forearm skin tear, cleansed with NS, vaseline gauze and tegaderm applied.  Bilateral arms and hands with bruises and scabs, lower lip also noted to have scabbed area.

## 2017-04-27 NOTE — Progress Notes (Signed)
Patient refused CPAP for tonight, RT made pt aware that if he changed his mind to call.

## 2017-04-28 ENCOUNTER — Observation Stay (HOSPITAL_BASED_OUTPATIENT_CLINIC_OR_DEPARTMENT_OTHER): Payer: Medicare Other

## 2017-04-28 DIAGNOSIS — K9401 Colostomy hemorrhage: Secondary | ICD-10-CM | POA: Diagnosis not present

## 2017-04-28 DIAGNOSIS — I70209 Unspecified atherosclerosis of native arteries of extremities, unspecified extremity: Secondary | ICD-10-CM | POA: Diagnosis not present

## 2017-04-28 DIAGNOSIS — D62 Acute posthemorrhagic anemia: Secondary | ICD-10-CM | POA: Diagnosis not present

## 2017-04-28 LAB — GLUCOSE, CAPILLARY
GLUCOSE-CAPILLARY: 123 mg/dL — AB (ref 65–99)
GLUCOSE-CAPILLARY: 161 mg/dL — AB (ref 65–99)
Glucose-Capillary: 113 mg/dL — ABNORMAL HIGH (ref 65–99)
Glucose-Capillary: 147 mg/dL — ABNORMAL HIGH (ref 65–99)

## 2017-04-28 LAB — COMPREHENSIVE METABOLIC PANEL
ALT: 17 U/L (ref 17–63)
ANION GAP: 13 (ref 5–15)
AST: 27 U/L (ref 15–41)
Albumin: 3 g/dL — ABNORMAL LOW (ref 3.5–5.0)
Alkaline Phosphatase: 81 U/L (ref 38–126)
BILIRUBIN TOTAL: 2.4 mg/dL — AB (ref 0.3–1.2)
BUN: 91 mg/dL — ABNORMAL HIGH (ref 6–20)
CO2: 28 mmol/L (ref 22–32)
Calcium: 8.9 mg/dL (ref 8.9–10.3)
Chloride: 90 mmol/L — ABNORMAL LOW (ref 101–111)
Creatinine, Ser: 3.03 mg/dL — ABNORMAL HIGH (ref 0.61–1.24)
GFR calc non Af Amer: 21 mL/min — ABNORMAL LOW (ref >60)
GFR, EST AFRICAN AMERICAN: 24 mL/min — AB (ref >60)
GLUCOSE: 122 mg/dL — AB (ref 65–99)
POTASSIUM: 2.8 mmol/L — AB (ref 3.5–5.1)
Sodium: 131 mmol/L — ABNORMAL LOW (ref 135–145)
TOTAL PROTEIN: 6.6 g/dL (ref 6.5–8.1)

## 2017-04-28 LAB — CBC
HEMATOCRIT: 28.5 % — AB (ref 39.0–52.0)
Hemoglobin: 9.3 g/dL — ABNORMAL LOW (ref 13.0–17.0)
MCH: 29.8 pg (ref 26.0–34.0)
MCHC: 32.6 g/dL (ref 30.0–36.0)
MCV: 91.3 fL (ref 78.0–100.0)
Platelets: 116 10*3/uL — ABNORMAL LOW (ref 150–400)
RBC: 3.12 MIL/uL — ABNORMAL LOW (ref 4.22–5.81)
RDW: 16.2 % — AB (ref 11.5–15.5)
WBC: 8 10*3/uL (ref 4.0–10.5)

## 2017-04-28 LAB — HIV ANTIBODY (ROUTINE TESTING W REFLEX): HIV Screen 4th Generation wRfx: NONREACTIVE

## 2017-04-28 MED ORDER — COLLAGENASE 250 UNIT/GM EX OINT
TOPICAL_OINTMENT | Freq: Every day | CUTANEOUS | Status: DC
  Administered 2017-04-28: 15:00:00 via TOPICAL
  Filled 2017-04-28: qty 30

## 2017-04-28 MED ORDER — WARFARIN SODIUM 3 MG PO TABS: 3.0000 mg | ORAL_TABLET | Freq: Once | ORAL | Status: DC

## 2017-04-28 MED ORDER — TORSEMIDE 20 MG PO TABS: 60.0000 mg | ORAL_TABLET | Freq: Every day | ORAL | 0 refills | Status: DC

## 2017-04-28 MED ORDER — WARFARIN - PHARMACIST DOSING INPATIENT: Freq: Every day | Status: DC

## 2017-04-28 MED ORDER — WARFARIN SODIUM 3 MG PO TABS: 3.0000 mg | ORAL_TABLET | Freq: Every day | ORAL | 11 refills | Status: DC

## 2017-04-28 MED ORDER — FERROUS SULFATE 325 (65 FE) MG PO TABS: 325.0000 mg | ORAL_TABLET | Freq: Three times a day (TID) | ORAL | 0 refills | Status: DC

## 2017-04-28 MED ORDER — POTASSIUM CHLORIDE CRYS ER 20 MEQ PO TBCR
40.0000 meq | EXTENDED_RELEASE_TABLET | ORAL | Status: AC
  Administered 2017-04-28 (×2): 40 meq via ORAL
  Filled 2017-04-28 (×2): qty 2

## 2017-04-28 MED ORDER — COLLAGENASE 250 UNIT/GM EX OINT: TOPICAL_OINTMENT | Freq: Every day | CUTANEOUS | 0 refills | Status: DC

## 2017-04-28 MED ORDER — POLYETHYLENE GLYCOL 3350 17 G PO PACK: 17.0000 g | PACK | Freq: Every day | ORAL | 0 refills | Status: DC | PRN

## 2017-04-28 MED ORDER — FERROUS SULFATE 325 (65 FE) MG PO TABS: 325.0000 mg | ORAL_TABLET | Freq: Three times a day (TID) | ORAL | Status: DC

## 2017-04-28 NOTE — Care Management Note (Signed)
Case Management Note  Patient Details  Name: [name] MRN: [id number] Date of Birth: August 25, 1956  Subjective/Objective: Pt presented for bleeding from stoma. Pt is from home with wife and plan to d/c home 04-28-17.                    Action/Plan: No home needs identified at this time.   Expected Discharge Date:  04/28/17               Expected Discharge Plan:  Home/Self Care  In-House Referral:  NA  Discharge planning Services  CM Consult  Post Acute Care Choice:  NA Choice offered to:  NA  DME Arranged:  N/A DME Agency:  NA  HH Arranged:  NA HH Agency:  NA  Status of Service:  Completed, signed off  If discussed at [place] of Stay Meetings, dates discussed:    Additional Comments:  Bethena Roys, RN 04/28/2017, 3:04 PM

## 2017-04-28 NOTE — Care Management Obs Status (Signed)
[facility] NOTIFICATION   Patient Details  Name: GARELD OBRECHT MRN: [id number] Date of Birth: [birth year]   Medicare Observation Status Notification Given:  Yes    Bethena Roys, RN 04/28/2017, 3:04 PM

## 2017-04-28 NOTE — Progress Notes (Signed)
VASCULAR LAB PRELIMINARY  ARTERIAL  ABI completed: Due to left ankle wounds, the blood pressure cuff was placed on the  Right ABI of 1.55 and left ABI of 1.51 are suggestive of falsely elevated values due to early medial calcification. All observed signals demonstrated normal waveforms.   RIGHT    LEFT    PRESSURE WAVEFORM  PRESSURE WAVEFORM  BRACHIAL HD Access  BRACHIAL 104 Triphasic  DP 131 Biphasic DP 136 Triphasic  PT 161 Triphasic PT 157 Triphasic    RIGHT LEFT  ABI 1.55 1.51     Legrand Como, RVT 04/28/2017, 12:23 PM

## 2017-04-28 NOTE — Consult Note (Addendum)
[facility] Nurse wound consult note Consult requested for bleeding stoma.  Pt states this occurred yesterday and surgical team assessed the site and it has resolved at this time. Current pouch intact with good seal; pt declined offer for pouch change.  He states he brought his own supplies and does not require assistance with changing the pouch or emptying.  He is in a closed end pouch.  Reason for Consult: Consult requested for left lower leg Wound type: Chronic ful thickness wound; pt states he has been using "some type of veterinarian medicine on it." Measurement: 3X2X.2cm Wound bed: 90% yellow slough, 10% red Drainage (amount, consistency, odor) Mod amt tan drainage, no odor Periwound: intact skin surrounding the wound Dressing procedure/placement/frequency: Santyl to provide enzymatic debridement of nonviable tissue.  Pt plans to discharge today and discussed application and topical treatment, he verbalized understanding. Please re-consult if further assistance is needed.  Thank-you,  Julien Girt MSN, [place], [place], [place], [place]

## 2017-04-28 NOTE — Progress Notes (Signed)
Pt has orders to be discharged. Discharge instructions given and pt has no additional questions at this time. Medication regimen reviewed and pt educated. Pt verbalized understanding and has no additional questions. Telemetry box removed. IV removed and site in good condition. Pt stable and waiting for transportation.  Lanier Felty RN 

## 2017-04-28 NOTE — Progress Notes (Signed)
    CC:  Bleeding from his ostomy   Subjective: No complaints this AM.  No further bleeding.  He reports no bleeding on the dressing when he replaced his colostomy bag last PM.  No blood in the ostomy bag.  He had stool in the bag.  We took the bag off, cleaned the stool away and looked at the ostomy again.  No bleeding and no blood in the stool in the bag noted.    Objective: Vital signs in last 24 hours: Temp:  [97.5 F (36.4 C)-98.7 F (37.1 C)] 98.7 F (37.1 C) (05/24 0407) Pulse Rate:  [68-72] 72 (05/24 0407) Resp:  [18-22] 20 (05/24 0407) BP: (99-111)/(51-68) 99/51 (05/24 0407) SpO2:  [92 %-100 %] 92 % (05/24 0407) Weight:  [77.8 kg (171 lb 9.6 oz)-81.6 kg (180 lb)] 77.8 kg (171 lb 9.6 oz) (05/24 0407) Last BM Date: 04/27/17  240 PO  urine 400 Afebrile, VSS H/H slowly declining No films Intake/Output from previous day: 05/23 0701 - 05/24 0700 In: 240 [P.O.:240] Out: 400 [Urine:400] Intake/Output this shift: No intake/output data recorded.  General appearance: alert, cooperative and no distress Resp: clear to auscultation bilaterally GI: soft, non-tender; bowel sounds normal; no masses,  no organomegaly and ostomy bag back in place, we took it down as noted above and no bleeding, no blood in the stool.   Lab Results:   Recent Labs  04/27/17 1816 04/28/17 0318  WBC 6.8 8.0  HGB 9.9* 9.3*  HCT 29.6* 28.5*  PLT 101* 116*    BMET  Recent Labs  04/27/17 1816 04/28/17 0318  NA 130* 131*  K 3.0* 2.8*  CL 89* 90*  CO2 27 28  GLUCOSE 194* 122*  BUN 93* 91*  CREATININE 3.18* 3.03*  CALCIUM 9.1 8.9   PT/INR  Recent Labs  04/27/17 1530  LABPROT 20.1*  INR 1.69     Recent Labs Lab 04/27/17 1127 04/28/17 0318  AST 28 27  ALT 19 17  ALKPHOS 94 81  BILITOT 2.0* 2.4*  PROT 7.1 6.6  ALBUMIN 3.0* 3.0*     Lipase     Component Value Date/Time   LIPASE 396 (H) 06/11/2016 0732     Medications:  . atorvastatin  40 mg Oral Daily  . insulin  aspart  0-9 Units Subcutaneous Q4H  . levothyroxine  25 mcg Oral QAC breakfast  . metoprolol succinate  12.5 mg Oral BID  . pantoprazole (PROTONIX) IV  40 mg Intravenous Q12H  . potassium chloride SA  20 mEq Oral Daily  . sodium chloride flush  3 mL Intravenous Q12H  . torsemide  40 mg Oral Daily  No IV fluids   Assessment/Plan Bleeding from ostomy site/hx of adenocarcinoma with addominal perineal resection 2008 Acute blood loss anemia CAD/CM/CHF/AICD/AF Chronic anticoagulation/Eliquis Type II diabetes Chronic kidney disease Hypertension OSA/CPAP Hx of TIA Dysphagia with recent esophageal dilatation  FEN:  Clears ID:  None DVT:  Eliquis  Last dose 04/26/17  Plan:  I do not think this is a surgical issue.  I think it is from the anticoagulation.  The ostomy looks great.         LOS: 0 days    [name] 04/28/2017 [phone]

## 2017-04-28 NOTE — Progress Notes (Signed)
ANTICOAGULATION CONSULT NOTE - Initial Consult  Pharmacy Consult for coumadin Indication: atrial fibrillation  No Known Allergies  Patient Measurements: Height: 5\' 9"  (175.3 cm) Weight: 171 lb 9.6 oz (77.8 kg) (scale b) IBW/kg (Calculated) : 70.7   Vital Signs: Temp: 98.7 F (37.1 C) (05/24 0407) Temp Source: Oral (05/24 0407) BP: 99/51 (05/24 0407) Pulse Rate: 72 (05/24 0407)  Labs:  Recent Labs  04/27/17 1127 04/27/17 1530 04/27/17 1816 04/28/17 0318  HGB 9.8*  --  9.9* 9.3*  HCT 29.3*  --  29.6* 28.5*  PLT 103*  --  101* 116*  LABPROT  --  20.1*  --   --   INR  --  1.69  --   --   CREATININE 3.20*  --  3.18* 3.03*    Estimated Creatinine Clearance: 25.3 mL/min (A) (by C-G formula based on SCr of 3.03 mg/dL (H)).   Medical History: Past Medical History:  Diagnosis Date  . Adenocarcinoma of rectum ([place])    a. 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 AICD (automatic cardioverter/defibrillator) present 2002   BI V ICD  . Anemia   . CAD (coronary artery disease)    a. BMS [facility] LAD 2001 at [facility] b. PTCA/atherectomy ramus and BMS to LAD 2009  . CHF (congestive heart failure) ([place])   . Cholelithiasis 06/2015  . Chronic kidney disease, stage IV (severe) ([place])   . Chronic systolic heart failure ([place])   . Colostomy in place [facility])   . Dizziness    a. chronic. Admission for this 07/18/2014  . DM type 2, uncontrolled, with renal complications ([place])   . Dysrhythmia   . Essential hypertension, benign   . GERD (gastroesophageal reflux disease)   . HCAP (healthcare-associated pneumonia) 07/21/2015  . Hematuria   . History of blood transfusion    "I've had 2 units so far this year" (09/27/2015)  . HLD (hyperlipidemia)   . Ischemic cardiomyopathy    EF 18% by nuclear study 2016, multiple myocardial infarctions in past    . Myocardial infarction ([place]) 2001  . Obesity   . Orthostatic hypotension   . OSA on CPAP   . Paroxysmal atrial fibrillation (HCC)    a. on amiodarone, digoxin and Eliquis   . PONV (postoperative nausea and vomiting)   . Prostate cancer ([place])    a. s/p seed implants with chemo and radiation  . SAH (subarachnoid hemorrhage) ([place])    post-traumatic (fall) [facility] 12/2014  . TIA (transient ischemic attack)     Medications:  Prescriptions Prior to Admission  Medication Sig Dispense Refill Last Dose  . apixaban (ELIQUIS) 5 MG TABS tablet Take 1 tablet (5 mg total) by mouth 2 (two) times daily. 60 tablet 6 04/27/2017 at 0700  . atorvastatin (LIPITOR) 40 MG tablet Take 1 tablet (40 mg total) by mouth daily. (Patient taking differently: Take 40 mg by mouth daily at 6 PM. ) 30 tablet 6 04/26/2017 at Unknown time  . Coenzyme Q10 (COQ10) 50 MG CAPS Take 1 capsule by mouth daily.   04/27/2017 at Unknown time  . glucose blood test strip Use to test blood sugar 3 times daily 100 each 4 unk at unk  . insulin aspart (NOVOLOG FLEXPEN) 100 UNIT/ML FlexPen 5 units before each meal (Patient taking differently: Inject 5 Units into the skin 3 (three) times daily with meals. 5 units before each meal) 15 mL 1 04/27/2017 at Unknown time  . Insulin Glargine (LANTUS SOLOSTAR) 100 UNIT/ML Solostar Pen Inject 15 Units into the skin  daily at 10 pm. 5 pen 3 04/26/2017 at Unknown time  . levothyroxine (SYNTHROID, LEVOTHROID) 25 MCG tablet Take 25 mcg by mouth daily before breakfast.   04/27/2017 at Unknown time  . metolazone (ZAROXOLYN) 2.5 MG tablet take 1 tablet by mouth weekly if needed for SWELLING 12 tablet 3 Past Week at Unknown time  . metoprolol succinate (TOPROL-XL) 25 MG 24 hr tablet take 1/2 tablet by mouth twice a day 45 tablet 3 04/27/2017 at 0700  . nitroGLYCERIN (NITROLINGUAL) 0.4 MG/SPRAY spray Place 1 spray under the tongue every 5 (five) minutes x 3 doses as needed for chest pain. Reported on 06/01/2016   unk at unk  . pantoprazole (PROTONIX) 40 MG tablet Take 1 tablet (40 mg total) by mouth daily. 30 tablet 0 04/27/2017 at Unknown time  . potassium chloride SA (K-DUR,KLOR-CON) 20 MEQ  tablet Take 20 mEq by mouth daily.   04/27/2017 at Unknown time  . PROAIR RESPICLICK 625 (90 BASE) MCG/ACT AEPB Inhale 2 puffs into the lungs every 6 (six) hours as needed (for breathing).   0 rescue at rescue  . [DISCONTINUED] torsemide (DEMADEX) 20 MG tablet Take 80 mg (4 tabs) in am and 40 mg (2 tabs) in pm (Patient taking differently: Take 40-80 mg by mouth See admin instructions. Take 80 mg (4 tabs) in am and 40 mg (2 tabs) in pm) 180 tablet 6 04/27/2017 at Unknown time   Scheduled:  . atorvastatin  40 mg Oral Daily  . ferrous sulfate  325 mg Oral TID WC  . insulin aspart  0-9 Units Subcutaneous Q4H  . levothyroxine  25 mcg Oral QAC breakfast  . metoprolol succinate  12.5 mg Oral BID  . pantoprazole (PROTONIX) IV  40 mg Intravenous Q12H  . sodium chloride flush  3 mL Intravenous Q12H    Assessment: 61 yo male with history of afib on apixiban PTA. He is here with bleeding from his ostomy site and apixiban was placed on hold.  He is noted with history of CKD and baseline SCr is around 2.5-3 however he has history of AKI with SCr up to 6.42.  Pharmacy has been consulted to dose coumadin -INR= 1.69 (last apixiban dose was 5/23; this INR is not a reliable baseline) -Hg= 9.6, plt= 218  He has been on coumadin is the past and the last regimen was 2mg /day except 3mg  on MoWeFrSa  Goal of Therapy:  INR 2-3 Monitor platelets by anticoagulation protocol: Yes   Plan:  -Coumadin 3mg  po today -Upon discharge would consider coumadin 3mg  po daily with INR follow-up on Monday -Daily PT/INR while inpatient  Hildred Laser, Pharm D 04/28/2017 10:57 AM

## 2017-04-28 NOTE — Consult Note (Signed)
   [facility] CM Inpatient Consult   04/28/2017  [name] 05-Jun-1956 [id number]   Patient admitted for observation for colostomy bleeding with a HX of HF, CKD, and rectal cancer for high risk assessment in the [facility] Medicare ACO.  Spoke with the patient by phone (saw in EPIC patient is [name]).  HIPAA verified.  Patient states he did not feel that he needed any post hospital monitoring.  He denies any issues at this time.  Patient's primary care provider, Redmond School, MD. Patient states he will accept general post hospital EMMI calls. No needs noted.  For questions, please contact:  Natividad Brood, RN BSN [facility] Liaison  [phone] business mobile phone Toll free office [phone]

## 2017-04-28 NOTE — Progress Notes (Signed)
Patient had no bleeding at ostomy site overnight. Vitals stable. No complaints of pain.

## 2017-05-03 DIAGNOSIS — Z7901 Long term (current) use of anticoagulants: Secondary | ICD-10-CM | POA: Diagnosis not present

## 2017-05-03 DIAGNOSIS — Z6826 Body mass index (BMI) 26.0-26.9, adult: Secondary | ICD-10-CM | POA: Diagnosis not present

## 2017-05-03 DIAGNOSIS — K9401 Colostomy hemorrhage: Secondary | ICD-10-CM | POA: Diagnosis not present

## 2017-05-03 DIAGNOSIS — D649 Anemia, unspecified: Secondary | ICD-10-CM | POA: Diagnosis not present

## 2017-05-03 DIAGNOSIS — K7460 Unspecified cirrhosis of liver: Secondary | ICD-10-CM | POA: Diagnosis not present

## 2017-05-03 DIAGNOSIS — E119 Type 2 diabetes mellitus without complications: Secondary | ICD-10-CM | POA: Diagnosis not present

## 2017-05-03 DIAGNOSIS — E663 Overweight: Secondary | ICD-10-CM | POA: Diagnosis not present

## 2017-05-03 NOTE — Telephone Encounter (Signed)
LM for pt to call back to schedule an appt.  °

## 2017-05-03 NOTE — Discharge Summary (Signed)
[facility] Discharge Summary   Patient: [name] FTD:[id number]   PCP: Elfredia Nevins, MD DOB: 01-15-1956   Date of admission: 04/27/2017   Date of discharge: 04/28/2017    Discharge Diagnoses:  Principal Problem:   Bleeding from colostomy stoma [facility]) Active Problems:   History of rectal cancer   CAD with LCX disease and stents to LAD   Chronic systolic heart failure (HCC)   Chronic kidney disease, stage IV (severe) (HCC)   DM type 2, uncontrolled, with renal complications (HCC)   GERD (gastroesophageal reflux disease)   OSA on CPAP   BiV ICD (St Jude) gen change 04/10/15   Transaminitis   Anemia of chronic disease   Hepatic cirrhosis (HCC)   GI bleed   Portal hypertensive gastropathy (HCC)   Hypokalemia   Admitted From: home Disposition:  home  Recommendations for Outpatient Follow-up:  1. Please follow up with PCP in 1 week 2. Establish care with coumadin clinic and get INR checked on Tuesday    Follow-up Information    Elfredia Nevins, MD. Go on 05/03/2017.   Specialty:  Internal Medicine Why:  @1 :00pm Contact information: [address] [place] [address] [phone]        [facility] [place]. Schedule an appointment as soon as possible for a visit on 05/02/2017.   Specialty:  Cardiology Why:  INR check up. new coumadin.  Contact information: [address] [place] [address] [phone]         Diet recommendation: cardiac diet  Activity: The patient is advised to gradually reintroduce usual activities.  Discharge Condition: good  Code Status: full code  History of present illness: As per the H and P dictated on admission, "[name] is a 61 y.o. male with Past medical history of Adenocarcinoma of rectum aspirate colostomy, chronic systolic CHF S/P AICD, A. fib S/P ablation, CAD, CKD IV, chronic anticoagulation, recurrent bleeding from stoma, HTN. Patient presented with complaints of bleeding from the  stoma ongoing since Sunday. Patient has history of recurrent bleeding from this stoma has been evaluated by GI and surgery in the past. Has been on Eliquis for at least 4 years. No recent change in the medications reported. Recently underwent EGD with esophageal dilatation where he was found to have portal hypertensive gastropathy. Also seen by cardiology and recommended to continue current medication. Did mention that he could have occasional intermittent bleeding from the stoma. Since Sunday the patient started having sporadic spurts of bright red blood bleeding from the stoma filling up his bed. He mention about 3-4 episodes like this since Sunday. Denies having any abdominal pain nausea or vomiting. He was seen by surgery office where he was locally treated with silver nitrate and his bleeding stopped. Surgery actually recommended to change his Eliquis to Coumadin. Improve his chances of reversal should he have any bleeding. He complains about shortness of breath as well as dizziness and fatigue. No chest pain or abdominal pain. No nausea no vomiting. No fever no chills. No cough. "  Hospital Course:  Summary of his active problems in the hospital is as following. 1. Bleeding from colostomy stoma (HCC)    History of rectal cancer   Anemia of chronic disease   Hepatic cirrhosis (HCC)   GI bleed   Portal hypertensive gastropathy (HCC)   GERD (gastroesophageal reflux disease) Presented with fatigue, shortness of breath, dizziness as well as episodes of sudden spurting of blood in colostomy. Currently has mild oozing from stoma,  no active bleeding reported. No abdominal pain no nausea no vomiting no fever no chills. Hemoglobin dropped from 13-9.8 but then stayed stable. Recent EGD showed portal hypertensive gastropathy. Colonoscopy last year for similar presentation as well as capsule was unremarkable. General surgery consulted, appreciate input, does not feel that the patient would benefit  from any surgical suture clipping as no active bleeding from a blood vessel reported. Surgery Recommended to consider changing his Eliquis to Coumadin,  I discuss with cardiology, they agreed and pt also agrees. Given his CKD it will be better as well from pharmacodynamic perspective.  2.  Hypokalemia Stable  3. CAD with LCX disease and stents to LAD   Chronic systolic heart failure (HCC)   BiV ICD (St Jude) gen change 04/10/15 Currently reducing his torsemide from 80 mg and 40 mg dose to 60 mg daily dose. Also reducing potassium for the same reason. Euvolemic.  4. Chronic kidney disease, stage IV (severe) (HCC) Mild worsening of his baseline renal function.  Denies any decrease in urination. Also due to renal function patient may be a better candidate for Coumadin rather than Eliquis.   5. DM type 2, uncontrolled, with renal complications (HCC) Continue home regimen   6.  OSA on CPAP C Pap daily at bedtime.  7. Transaminitis Elevation of serum bilirubin, will need to monitor closely.  8. Nonhealing ulcer of the left lower extremity. Normal ABI, but appear falsely elevated.  No symptoms but may need further work up if the wound is not healing.   All other chronic medical condition were stable during the hospitalization.  Patient was ambulatory without any assistance. On the day of the discharge the patient's vitals were stable, and no other acute medical condition were reported by patient. the patient was felt safe to be discharge at home with family.  Procedures and Results:  none   Consultations:  General surgery  DISCHARGE MEDICATION: Discharge Medication List as of 04/28/2017  2:03 PM    START taking these medications   Details  collagenase (SANTYL) ointment Apply topically daily., Starting Thu 04/28/2017, Normal    ferrous sulfate 325 (65 FE) MG tablet Take 1 tablet (325 mg total) by mouth 3 (three) times daily with meals., Starting Thu 04/28/2017, Normal      polyethylene glycol (MIRALAX / GLYCOLAX) packet Take 17 g by mouth daily as needed for mild constipation., Starting Thu 04/28/2017, Normal    warfarin (COUMADIN) 3 MG tablet Take 1 tablet (3 mg total) by mouth daily., Starting Thu 04/28/2017, Until Fri 04/28/2018, Normal      CONTINUE these medications which have CHANGED   Details  torsemide (DEMADEX) 20 MG tablet Take 3 tablets (60 mg total) by mouth daily., Starting Thu 04/28/2017, Normal      CONTINUE these medications which have NOT CHANGED   Details  atorvastatin (LIPITOR) 40 MG tablet Take 1 tablet (40 mg total) by mouth daily., Starting Tue 02/15/2017, Until Mon 05/16/2017, Normal    Coenzyme Q10 (COQ10) 50 MG CAPS Take 1 capsule by mouth daily., Historical Med    glucose blood test strip Use to test blood sugar 3 times daily, Normal    insulin aspart (NOVOLOG FLEXPEN) 100 UNIT/ML FlexPen 5 units before each meal, Normal    Insulin Glargine (LANTUS SOLOSTAR) 100 UNIT/ML Solostar Pen Inject 15 Units into the skin daily at 10 pm., Starting Wed 04/13/2017, Normal    levothyroxine (SYNTHROID, LEVOTHROID) 25 MCG tablet Take 25 mcg by mouth daily before breakfast., Historical Med  metolazone (ZAROXOLYN) 2.5 MG tablet take 1 tablet by mouth weekly if needed for SWELLING, Normal    metoprolol succinate (TOPROL-XL) 25 MG 24 hr tablet take 1/2 tablet by mouth twice a day, Normal    nitroGLYCERIN (NITROLINGUAL) 0.4 MG/SPRAY spray Place 1 spray under the tongue every 5 (five) minutes x 3 doses as needed for chest pain. Reported on 06/01/2016, Until Discontinued, Historical Med    pantoprazole (PROTONIX) 40 MG tablet Take 1 tablet (40 mg total) by mouth daily., Starting 05/07/2016, Until Discontinued, Normal    potassium chloride SA (K-DUR,KLOR-CON) 20 MEQ tablet Take 20 mEq by mouth daily., Historical Med    PROAIR RESPICLICK 108 (90 BASE) MCG/ACT AEPB Inhale 2 puffs into the lungs every 6 (six) hours as needed (for breathing). , Starting  08/15/2015, Until Discontinued, Historical Med      STOP taking these medications     apixaban (ELIQUIS) 5 MG TABS tablet        No Known Allergies Discharge Instructions    Ambulatory referral to Anticoagulation Monitoring    Complete by:  As directed    INR Goal:  2.0-3.0   Responsible Group:  CVD TOC [place]   Diet - low sodium heart healthy    Complete by:  As directed    Discharge instructions    Complete by:  As directed    It is important that you read following instructions as well as go over your medication list with RN to help you understand your care after this hospitalization.  Discharge Instructions: Please follow-up with PCP in one week with CBC and renal function.  Please request your primary care physician to go over all Hospital Tests and Procedure/Radiological results at the follow up,  Please get all Hospital records sent to your PCP by signing hospital release before you go home.   Do not take more than prescribed Pain, Sleep and Anxiety Medications. You were cared for by a hospitalist during your hospital stay. If you have any questions about your discharge medications or the care you received while you were in the hospital after you are discharged, you can call the unit and ask to speak with the hospitalist on call if the hospitalist that took care of you is not available.  Once you are discharged, your primary care physician will handle any further medical issues. Please note that NO REFILLS for any discharge medications will be authorized once you are discharged, as it is imperative that you return to your primary care physician (or establish a relationship with a primary care physician if you do not have one) for your aftercare needs so that they can reassess your need for medications and monitor your lab values. You Must read complete instructions/literature along with all the possible adverse reactions/side effects for all the Medicines you take and that  have been prescribed to you. Take any new Medicines after you have completely understood and accept all the possible adverse reactions/side effects. Wear Seat belts while driving. If you have smoked or chewed Tobacco in the last 2 yrs please stop smoking and/or stop any Recreational drug use.   Increase activity slowly    Complete by:  As directed      Discharge Exam: Filed Weights   04/27/17 1119 04/27/17 1711 04/28/17 0407  Weight: 81.6 kg (180 lb) 79.3 kg (174 lb 14.4 oz) 77.8 kg (171 lb 9.6 oz)   Vitals:   04/28/17 0407 04/28/17 1238  BP: (!) 99/51 98/68  Pulse: 72 75  Resp: 20 20  Temp: 98.7 F (37.1 C) 98.6 F (37 C)   General: Appear in no distress, no Rash; Oral Mucosa moist. Cardiovascular: S1 and S2 Present, no Murmur, no JVD Respiratory: Bilateral Air entry present and Clear to Auscultation, no Crackles, no wheezes Abdomen: Bowel Sound present, Soft and no tenderness, no bleeding from stoma Extremities: no Pedal edema, no calf tenderness Neurology: Grossly no focal neuro deficit.  The results of significant diagnostics from this hospitalization (including imaging, microbiology, ancillary and laboratory) are listed below for reference.    Significant Diagnostic Studies: No results found.  Microbiology: No results found for this or any previous visit (from the past 240 hour(s)).   Labs: CBC:  Recent Labs Lab 04/27/17 1127 04/27/17 1816 04/28/17 0318  WBC 6.0 6.8 8.0  NEUTROABS  --  5.4  --   HGB 9.8* 9.9* 9.3*  HCT 29.3* 29.6* 28.5*  MCV 93.3 92.8 91.3  PLT 103* 101* 116*   Basic Metabolic Panel:  Recent Labs Lab 04/27/17 1127 04/27/17 1816 04/28/17 0318  NA 132* 130* 131*  K 2.8* 3.0* 2.8*  CL 90* 89* 90*  CO2 29 27 28   GLUCOSE 224* 194* 122*  BUN 94* 93* 91*  CREATININE 3.20* 3.18* 3.03*  CALCIUM 9.2 9.1 8.9  MG  --  2.5*  --    Liver Function Tests:  Recent Labs Lab 04/27/17 1127 04/28/17 0318  AST 28 27  ALT 19 17  ALKPHOS 94 81   BILITOT 2.0* 2.4*  PROT 7.1 6.6  ALBUMIN 3.0* 3.0*   BNP (last 3 results)  Recent Labs  05/09/16 2343 06/01/16 1200  BNP 377.6* 1,016.7*   CBG:  Recent Labs Lab 04/27/17 2111 04/28/17 0014 04/28/17 0546 04/28/17 0736 04/28/17 1131  GLUCAP 208* 161* 123* 113* 147*   Time spent: 35 minutes  Signed:  Coreen Shippee  [facility] 04/28/2017, 7:23 AM

## 2017-05-04 ENCOUNTER — Encounter: Payer: Medicare Other | Admitting: Nutrition

## 2017-05-04 ENCOUNTER — Ambulatory Visit (INDEPENDENT_AMBULATORY_CARE_PROVIDER_SITE_OTHER): Payer: Medicare Other | Admitting: *Deleted

## 2017-05-04 DIAGNOSIS — I255 Ischemic cardiomyopathy: Secondary | ICD-10-CM

## 2017-05-04 DIAGNOSIS — I482 Chronic atrial fibrillation, unspecified: Secondary | ICD-10-CM

## 2017-05-04 DIAGNOSIS — I480 Paroxysmal atrial fibrillation: Secondary | ICD-10-CM | POA: Diagnosis not present

## 2017-05-04 DIAGNOSIS — Z7901 Long term (current) use of anticoagulants: Secondary | ICD-10-CM

## 2017-05-04 LAB — POCT INR: INR: 2

## 2017-05-09 ENCOUNTER — Ambulatory Visit (INDEPENDENT_AMBULATORY_CARE_PROVIDER_SITE_OTHER): Payer: Medicare Other | Admitting: *Deleted

## 2017-05-09 DIAGNOSIS — I480 Paroxysmal atrial fibrillation: Secondary | ICD-10-CM

## 2017-05-09 DIAGNOSIS — I255 Ischemic cardiomyopathy: Secondary | ICD-10-CM | POA: Diagnosis not present

## 2017-05-09 DIAGNOSIS — I482 Chronic atrial fibrillation, unspecified: Secondary | ICD-10-CM

## 2017-05-09 DIAGNOSIS — Z7901 Long term (current) use of anticoagulants: Secondary | ICD-10-CM

## 2017-05-09 LAB — POCT INR: INR: 4.4

## 2017-05-11 ENCOUNTER — Encounter: Payer: Medicare Other | Admitting: Nutrition

## 2017-05-16 ENCOUNTER — Ambulatory Visit (INDEPENDENT_AMBULATORY_CARE_PROVIDER_SITE_OTHER): Payer: Medicare Other | Admitting: *Deleted

## 2017-05-16 DIAGNOSIS — I482 Chronic atrial fibrillation, unspecified: Secondary | ICD-10-CM

## 2017-05-16 DIAGNOSIS — I255 Ischemic cardiomyopathy: Secondary | ICD-10-CM

## 2017-05-16 DIAGNOSIS — Z7901 Long term (current) use of anticoagulants: Secondary | ICD-10-CM

## 2017-05-16 DIAGNOSIS — I480 Paroxysmal atrial fibrillation: Secondary | ICD-10-CM | POA: Diagnosis not present

## 2017-05-16 LAB — POCT INR: INR: 3.5

## 2017-05-20 DIAGNOSIS — E611 Iron deficiency: Secondary | ICD-10-CM | POA: Diagnosis not present

## 2017-05-24 ENCOUNTER — Ambulatory Visit (INDEPENDENT_AMBULATORY_CARE_PROVIDER_SITE_OTHER): Payer: Medicare Other | Admitting: *Deleted

## 2017-05-24 DIAGNOSIS — I482 Chronic atrial fibrillation, unspecified: Secondary | ICD-10-CM

## 2017-05-24 DIAGNOSIS — I255 Ischemic cardiomyopathy: Secondary | ICD-10-CM

## 2017-05-24 DIAGNOSIS — I480 Paroxysmal atrial fibrillation: Secondary | ICD-10-CM | POA: Diagnosis not present

## 2017-05-24 DIAGNOSIS — Z7901 Long term (current) use of anticoagulants: Secondary | ICD-10-CM

## 2017-05-24 LAB — POCT INR: INR: 3.5

## 2017-05-24 MED ORDER — WARFARIN SODIUM 2 MG PO TABS: ORAL_TABLET | ORAL | 3 refills | Status: DC

## 2017-05-26 ENCOUNTER — Telehealth: Payer: Self-pay | Admitting: *Deleted

## 2017-05-26 ENCOUNTER — Ambulatory Visit (INDEPENDENT_AMBULATORY_CARE_PROVIDER_SITE_OTHER): Payer: Medicare Other | Admitting: Internal Medicine

## 2017-05-26 ENCOUNTER — Encounter: Payer: Self-pay | Admitting: Internal Medicine

## 2017-05-26 VITALS — BP 100/62 | HR 78 | Ht 70.0 in | Wt 181.0 lb

## 2017-05-26 DIAGNOSIS — I482 Chronic atrial fibrillation, unspecified: Secondary | ICD-10-CM

## 2017-05-26 DIAGNOSIS — I255 Ischemic cardiomyopathy: Secondary | ICD-10-CM

## 2017-05-26 DIAGNOSIS — Z4502 Encounter for adjustment and management of automatic implantable cardiac defibrillator: Secondary | ICD-10-CM

## 2017-05-26 DIAGNOSIS — I5022 Chronic systolic (congestive) heart failure: Secondary | ICD-10-CM

## 2017-05-26 DIAGNOSIS — I2589 Other forms of chronic ischemic heart disease: Secondary | ICD-10-CM

## 2017-05-26 MED ORDER — WARFARIN SODIUM 2 MG PO TABS: ORAL_TABLET | ORAL | 3 refills | Status: DC

## 2017-05-26 NOTE — Patient Instructions (Addendum)
Medication Instructions:   Your physician has recommended you make the following change in your medication:   - Take an extra Potassium pill when you take your Metolazone  --- If you need a refill on your cardiac medications before your next appointment, please call your pharmacy. ---  Labwork:  None ordered  Testing/Procedures:  None ordered  Follow-Up: Remote monitoring is used to monitor your Pacemaker of ICD from home. This monitoring reduces the number of office visits required to check your device to one time per year. It allows [place] to keep an eye on the functioning of your device to ensure it is working properly. You are scheduled for a device check from home on 08/25/2017. You may send your transmission at any time that day. If you have a wireless device, the transmission will be sent automatically. After your physician reviews your transmission, you will receive a postcard with your next transmission date.   Your physician wants you to follow-up in: 1 year with Dr. Lovena Le.  You will receive a reminder letter in the mail two months in advance. If you don't receive a letter, please call our office to schedule the follow-up appointment.  Thank you for choosing [facility]!!

## 2017-05-26 NOTE — Progress Notes (Signed)
HPI [name] returns today for ongoing ICD evaluation and management. He is a pleasant 61 yo man, s/p MI, s/p ICD implant in 2002 and a BiV ICD upgrade in 2009. He underwent AV node ablation after his ventricular rate could not be controlled. In the interim, he has had gradual worsening in his renal function. He denies chest pain but does note that he has peripheral edema and takes an additional metolazone as needed, typically twice a week to control his propensity to have volume overload. He is recovering from a sore on his left leg.  No Known Allergies   Current Outpatient Prescriptions  Medication Sig Dispense Refill  . atorvastatin (LIPITOR) 40 MG tablet Take 1 tablet (40 mg total) by mouth daily. (Patient taking differently: Take 40 mg by mouth daily at 6 PM. ) 30 tablet 6  . Coenzyme Q10 (COQ10) 50 MG CAPS Take 1 capsule by mouth daily.    . collagenase (SANTYL) ointment Apply topically daily. 15 g 0  . ferrous sulfate 325 (65 FE) MG tablet Take 1 tablet (325 mg total) by mouth 3 (three) times daily with meals. 90 tablet 0  . glucose blood test strip Use to test blood sugar 3 times daily 100 each 4  . insulin aspart (NOVOLOG FLEXPEN) 100 UNIT/ML FlexPen 5 units before each meal (Patient taking differently: Inject 5 Units into the skin 3 (three) times daily with meals. 5 units before each meal) 15 mL 1  . Insulin Glargine (LANTUS SOLOSTAR) 100 UNIT/ML Solostar Pen Inject 15 Units into the skin daily at 10 pm. 5 pen 3  . levothyroxine (SYNTHROID, LEVOTHROID) 25 MCG tablet Take 25 mcg by mouth daily before breakfast.    . metolazone (ZAROXOLYN) 2.5 MG tablet take 1 tablet by mouth weekly if needed for SWELLING 12 tablet 3  . metoprolol succinate (TOPROL-XL) 25 MG 24 hr tablet take 1/2 tablet by mouth twice a day 45 tablet 3  . nitroGLYCERIN (NITROLINGUAL) 0.4 MG/SPRAY spray Place 1 spray under the tongue every 5 (five) minutes x 3 doses as needed for chest pain. Reported on 06/01/2016     . pantoprazole (PROTONIX) 40 MG tablet Take 1 tablet (40 mg total) by mouth daily. 30 tablet 0  . polyethylene glycol (MIRALAX / GLYCOLAX) packet Take 17 g by mouth daily as needed for mild constipation. 14 each 0  . potassium chloride SA (K-DUR,KLOR-CON) 20 MEQ tablet Take 20 mEq by mouth daily.    Marland Kitchen PROAIR RESPICLICK 433 (90 BASE) MCG/ACT AEPB Inhale 2 puffs into the lungs every 6 (six) hours as needed (for breathing).   0  . torsemide (DEMADEX) 20 MG tablet Take 3 tablets (60 mg total) by mouth daily. 180 tablet 0  . warfarin (COUMADIN) 2 MG tablet Take 1 tablet daily except 1/2 tablet on Tuesdays and Fridays 30 tablet 3   No current facility-administered medications for this visit.      Past Medical History:  Diagnosis Date  . Adenocarcinoma of rectum ([place])    a. 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 AICD (automatic cardioverter/defibrillator) present 2002   BI V ICD  . Anemia   . CAD (coronary artery disease)    a. BMS to LAD 2001 at [facility] b. PTCA/atherectomy ramus and BMS to LAD 2009  . CHF (congestive heart failure) ([place])   . Cholelithiasis 06/2015  . Chronic kidney disease, stage IV (severe) ([place])   . Chronic systolic heart failure ([place])   . Colostomy in place [facility])   .  Dizziness    a. chronic. Admission for this 07/18/2014  . DM type 2, uncontrolled, with renal complications ([place])   . Dysrhythmia   . Essential hypertension, benign   . GERD (gastroesophageal reflux disease)   . HCAP (healthcare-associated pneumonia) 07/21/2015  . Hematuria   . History of blood transfusion    "I've had 2 units so far this year" (09/27/2015)  . HLD (hyperlipidemia)   . Ischemic cardiomyopathy    EF 18% by nuclear study 2016, multiple myocardial infarctions in past    . Myocardial infarction ([place]) 2001  . Obesity   . Orthostatic hypotension   . OSA on CPAP   . Paroxysmal atrial fibrillation ([facility])    a. on amiodarone, digoxin and Eliquis  . PONV (postoperative nausea and vomiting)   . Prostate cancer  ([place])    a. s/p seed implants with chemo and radiation  . SAH (subarachnoid hemorrhage) ([place])    post-traumatic (fall) [facility] 12/2014  . TIA (transient ischemic attack)     ROS:   All systems reviewed and negative except as noted in the HPI.   Past Surgical History:  Procedure Laterality Date  . Abdominal and Perineal Resection of Rectum with Total Mesorectal Excision  10/04/2007  . AV FISTULA PLACEMENT Right 09/16/2015   Procedure: ARTERIOVENOUS (AV) FISTULA CREATION - BRACHIOCEPHALIC;  Surgeon: Elam Dutch, MD;  Location: [place];  Service: Vascular;  Laterality: Right;  . BI-VENTRICULAR IMPLANTABLE CARDIOVERTER DEFIBRILLATOR  (CRT-D)  2009  . CARDIAC CATHETERIZATION  08/2001; 2009   ; [name] 07/10/2013  . CARDIAC DEFIBRILLATOR PLACEMENT  2002  . CARDIAC DEFIBRILLATOR PLACEMENT  2009   Upgraded to a BiV ICD  . COLONOSCOPY  09/14/2011   Dr. Gala Romney: via colostomy, Single pedunculated benign inflammatory polyp. Due for surveillance Oct 2015  . COLONOSCOPY N/A 07/02/2014   Procedure: COLONOSCOPY;  Surgeon: Daneil Dolin, MD;  [facility];  Service: Endoscopy;  Laterality: N/A;  7:30 / COLONOSCOPY THRU COLOSTOMY  . COLONOSCOPY N/A 12/11/2014   Dr. Gala Romney via colostomy. Normal. Repeat in 2021.   Marland Kitchen COLONOSCOPY N/A 08/24/2015   Dr. Havery Moros: diminutive polyp not removed, stoma site of bleeding  . COLOSTOMY  2008  . CORONARY ANGIOPLASTY WITH STENT PLACEMENT  2001; ~ 2006   "1 + 1"   . ELECTROPHYSIOLOGIC STUDY N/A 08/28/2015   Procedure: AV Node Ablation;  Surgeon: Will Meredith Leeds, MD;  Location: [place] CV LAB;  Service: Cardiovascular;  Laterality: N/A;  . EP IMPLANTABLE DEVICE N/A 04/10/2015   Procedure: Ppm/Biv Ppm Generator Changeout;  Surgeon: Evans Lance, MD;  Location: [place] INVASIVE CV LAB CUPID;  Service: Cardiovascular;  Laterality: N/A;  . ERCP N/A 06/11/2016   Procedure: ENDOSCOPIC RETROGRADE CHOLANGIOPANCREATOGRAPHY (ERCP);  Surgeon: Teena Irani, MD;  Location: [facility]  ENDOSCOPY;  Service: Endoscopy;  Laterality: N/A;  . ERCP N/A 01/26/2017   Procedure: ENDOSCOPIC RETROGRADE CHOLANGIOPANCREATOGRAPHY (ERCP);  Surgeon: Teena Irani, MD;  Location: [facility] ENDOSCOPY;  Service: Endoscopy;  Laterality: N/A;  . ERCP  06/2016   Dr. Amedeo Plenty: duodenal fistula, dilated bile duct but no obvious stones, s/p sphincterotomy and stent placement  . ERCP  01/2017   Dr. Amedeo Plenty: CBD stones, s/p sphincterotomy, balloon extraction, stent removal  . ESOPHAGOGASTRODUODENOSCOPY N/A 07/02/2014   Procedure: ESOPHAGOGASTRODUODENOSCOPY (EGD);  Surgeon: Daneil Dolin, MD;  [facility];  Service: Endoscopy;  Laterality: N/A;  7:30  . ESOPHAGOGASTRODUODENOSCOPY N/A 12/11/2014   JSH:FWYOVZ EGD  . ESOPHAGOGASTRODUODENOSCOPY (EGD) WITH PROPOFOL N/A 04/18/2017  Procedure: ESOPHAGOGASTRODUODENOSCOPY (EGD) WITH PROPOFOL;  Surgeon: Daneil Dolin, MD;  [facility];  Service: Endoscopy;  Laterality: N/A;  2:30pm  . GASTROINTESTINAL STENT REMOVAL N/A 01/26/2017   Procedure: GASTROINTESTINAL STENT REMOVAL;  Surgeon: Teena Irani, MD;  Location: [place];  Service: Endoscopy;  Laterality: N/A;  . GIVENS CAPSULE STUDY N/A 07/23/2015   Dr. Michail Sermon: small non-bleeding AVM, mild gastritis  . LEFT HEART CATHETERIZATION WITH CORONARY ANGIOGRAM N/A 07/13/2013   Procedure: LEFT HEART CATHETERIZATION WITH CORONARY ANGIOGRAM;  Surgeon: Lorretta Harp, MD;  Location: [facility] CATH LAB;  Service: Cardiovascular;  Laterality: N/A;  . Venia Minks DILATION N/A 07/02/2014   Procedure: Venia Minks DILATION;  Surgeon: Daneil Dolin, MD;  [facility];  Service: Endoscopy;  Laterality: N/A;  7:30  . MALONEY DILATION N/A 04/18/2017   Procedure: Venia Minks DILATION;  Surgeon: Daneil Dolin, MD;  [facility];  Service: Endoscopy;  Laterality: N/A;  . PORTACATH PLACEMENT  06/2007   "removed ~ 1 yr later"  . RIGHT HEART CATHETERIZATION N/A 02/24/2015   Procedure: RIGHT HEART CATH;  Surgeon: Jolaine Artist, MD;  Location: [facility] CATH LAB;  Service: Cardiovascular;  Laterality: N/A;  . SAVORY DILATION N/A 07/02/2014   Procedure: SAVORY DILATION;  Surgeon: Daneil Dolin, MD;  [facility];  Service: Endoscopy;  Laterality: N/A;  7:30     Family History  Problem Relation Age of Onset  . Colon cancer Mother 20  . Coronary artery disease Father   . Colon cancer Sister 75  . Diabetes Sister   . Colon cancer Other        2 cousins, succumbed to illness     Social History   Social History  . Marital status: Married    Spouse name: N/A  . Number of children: 1  . Years of education: N/A   Occupational History  .  Unemployed   Social History Main Topics  . Smoking status: Never Smoker  . Smokeless tobacco: Never Used  . Alcohol use No     Comment: Former user 45 years ago  . Drug use: No  . Sexual activity: No   Other Topics Concern  . Not on file   Social History Narrative   Married 43 years   1 son, 2 grandkids    Denies alcohol, drugs, tobacco      BP 100/62   Pulse 78   Ht 5\' 10"  (1.778 m)   Wt 181 lb (82.1 kg)   SpO2 94%   BMI 25.97 kg/m   Physical Exam:  Stable appearing middle aged man, NAD HEENT: Unremarkable Neck:  7 cm JVD, no thyromegally Back:  No CVA tenderness Lungs:  Clear with no wheezes, rales, or rhonchi HEART:  Regular rate rhythm, no murmurs, no rubs, no clicks, soft S4 gallop Abd:  soft, positive bowel sounds, no organomegally, no rebound, no guarding Ext:  2 plus pulses, 1+ peripheral edema, no cyanosis, right arm has a fistula in place Skin:  No rashes no nodules Neuro:  CN II through XII intact, motor grossly intact   DEVICE  Normal device function. His underlying atrial rhythm is atrial fib with biv pacing  Assess/Plan: 1. Chronic systolic heart failure - his symptoms are class 2. He will continue his current meds. I have asked him to maintain a low sodium diet. He will take an extra metolazone and I have asked him  to take an extra potassium with his metolazone 2.  HTN - His blood pressure is well controlled. No change in meds. 3. ICD - his [employer] BiV ICD is working normally. Will recheck in several months. 4. Dyslipidemia - he will continue atorvastatin.  [name].D.

## 2017-05-26 NOTE — Telephone Encounter (Signed)
New coumadin order resent to [facility].

## 2017-05-26 NOTE — Telephone Encounter (Signed)
Patient states that [facility] never got refill on Coumadin. Will you please resend to [facility]. / tg

## 2017-05-31 ENCOUNTER — Telehealth: Payer: Self-pay | Admitting: Gastroenterology

## 2017-05-31 NOTE — Progress Notes (Signed)
Records received from [place]. Looked thru epic, no indication that TCS with polyp removal performed after initial tcs 2016 at which time polyp in transverse colon was found and not manipulated as patient was on Eliquis.   He has f/u ov, can discuss possible TCS at that time if appropriate.

## 2017-05-31 NOTE — Telephone Encounter (Signed)
Please NIC for ruq u/s in 08/2017 for hepatoma screening.  Needs labs in 08/2017: CMET, CBC, AFP tumor marker

## 2017-06-01 ENCOUNTER — Other Ambulatory Visit: Payer: Self-pay | Admitting: Gastroenterology

## 2017-06-01 DIAGNOSIS — K7460 Unspecified cirrhosis of liver: Secondary | ICD-10-CM

## 2017-06-01 NOTE — Telephone Encounter (Signed)
Lab order on file. 

## 2017-06-01 NOTE — Telephone Encounter (Signed)
NIC'D

## 2017-06-07 ENCOUNTER — Ambulatory Visit (INDEPENDENT_AMBULATORY_CARE_PROVIDER_SITE_OTHER): Payer: Medicare Other | Admitting: *Deleted

## 2017-06-07 DIAGNOSIS — Z7901 Long term (current) use of anticoagulants: Secondary | ICD-10-CM | POA: Diagnosis not present

## 2017-06-07 DIAGNOSIS — I482 Chronic atrial fibrillation, unspecified: Secondary | ICD-10-CM

## 2017-06-07 DIAGNOSIS — I255 Ischemic cardiomyopathy: Secondary | ICD-10-CM

## 2017-06-07 DIAGNOSIS — I480 Paroxysmal atrial fibrillation: Secondary | ICD-10-CM | POA: Diagnosis not present

## 2017-06-07 LAB — POCT INR: INR: 2.3

## 2017-06-10 ENCOUNTER — Telehealth: Payer: Self-pay | Admitting: Cardiology

## 2017-06-10 MED ORDER — NITROGLYCERIN 0.4 MG SL SUBL: 0.4000 mg | SUBLINGUAL_TABLET | SUBLINGUAL | 3 refills | Status: DC | PRN

## 2017-06-10 MED ORDER — TORSEMIDE 20 MG PO TABS: ORAL_TABLET | ORAL | 0 refills | Status: DC

## 2017-06-10 NOTE — Telephone Encounter (Signed)
Pt voiced understanding and will update [place] on Monday on weights - torsemide and SL NTG sent to [facility] as requested

## 2017-06-10 NOTE — Telephone Encounter (Signed)
How have his weights done taking 60mg  daily, has he had much swelling?  Zandra Abts MD

## 2017-06-10 NOTE — Telephone Encounter (Signed)
[name] called stating that his torsemide [facility]  has been filled incorrectly.   Please call [phone].  needs refill on Nitroglycerin (does not want the spray)  [facility]  [place] [place]

## 2017-06-10 NOTE — Telephone Encounter (Signed)
Pt says torsemide was decreased to 60 mg daily when pt was in ED - wants to know if Dr Harl Bowie wants him taking torsemide 60 mg daily or 80 mg in AM and 40 mg in PM. Also wants NTG SL tablets not spay - routed to Dr Harl Bowie

## 2017-06-10 NOTE — Telephone Encounter (Signed)
Pt says he has gained 8lbs since ED - 189lbs today - says "swelling terrible in my feet and legs" denies increased SOB or any other symptoms at this time.

## 2017-06-10 NOTE — Telephone Encounter (Signed)
Take torsemide 80mg  in AM and 40mg  in PM over weekend, update [place] weights and swelling on Monday  Zandra Abts MD

## 2017-06-13 ENCOUNTER — Emergency Department (HOSPITAL_COMMUNITY): Payer: Medicare Other

## 2017-06-13 ENCOUNTER — Encounter (HOSPITAL_COMMUNITY): Payer: Self-pay | Admitting: Emergency Medicine

## 2017-06-13 ENCOUNTER — Inpatient Hospital Stay (HOSPITAL_COMMUNITY)
Admission: EM | Admit: 2017-06-13 | Discharge: 2017-06-21 | DRG: 291 | Disposition: A | Discharge status: Home / Self Care | Payer: Medicare Other | Attending: Cardiology | Admitting: Cardiology

## 2017-06-13 ENCOUNTER — Telehealth: Payer: Self-pay

## 2017-06-13 DIAGNOSIS — I5031 Acute diastolic (congestive) heart failure: Secondary | ICD-10-CM | POA: Diagnosis present

## 2017-06-13 DIAGNOSIS — E1122 Type 2 diabetes mellitus with diabetic chronic kidney disease: Secondary | ICD-10-CM | POA: Diagnosis present

## 2017-06-13 DIAGNOSIS — E1121 Type 2 diabetes mellitus with diabetic nephropathy: Secondary | ICD-10-CM | POA: Diagnosis not present

## 2017-06-13 DIAGNOSIS — G4733 Obstructive sleep apnea (adult) (pediatric): Secondary | ICD-10-CM | POA: Diagnosis present

## 2017-06-13 DIAGNOSIS — Z79899 Other long term (current) drug therapy: Secondary | ICD-10-CM

## 2017-06-13 DIAGNOSIS — I255 Ischemic cardiomyopathy: Secondary | ICD-10-CM | POA: Diagnosis present

## 2017-06-13 DIAGNOSIS — D696 Thrombocytopenia, unspecified: Secondary | ICD-10-CM | POA: Diagnosis not present

## 2017-06-13 DIAGNOSIS — N179 Acute kidney failure, unspecified: Secondary | ICD-10-CM | POA: Diagnosis present

## 2017-06-13 DIAGNOSIS — Z933 Colostomy status: Secondary | ICD-10-CM

## 2017-06-13 DIAGNOSIS — I5022 Chronic systolic (congestive) heart failure: Secondary | ICD-10-CM

## 2017-06-13 DIAGNOSIS — I5043 Acute on chronic combined systolic (congestive) and diastolic (congestive) heart failure: Secondary | ICD-10-CM | POA: Diagnosis not present

## 2017-06-13 DIAGNOSIS — K219 Gastro-esophageal reflux disease without esophagitis: Secondary | ICD-10-CM | POA: Diagnosis present

## 2017-06-13 DIAGNOSIS — Z85048 Personal history of other malignant neoplasm of rectum, rectosigmoid junction, and anus: Secondary | ICD-10-CM | POA: Diagnosis not present

## 2017-06-13 DIAGNOSIS — K7460 Unspecified cirrhosis of liver: Secondary | ICD-10-CM | POA: Diagnosis present

## 2017-06-13 DIAGNOSIS — Z8249 Family history of ischemic heart disease and other diseases of the circulatory system: Secondary | ICD-10-CM

## 2017-06-13 DIAGNOSIS — I2510 Atherosclerotic heart disease of native coronary artery without angina pectoris: Secondary | ICD-10-CM | POA: Diagnosis present

## 2017-06-13 DIAGNOSIS — E1165 Type 2 diabetes mellitus with hyperglycemia: Secondary | ICD-10-CM | POA: Diagnosis not present

## 2017-06-13 DIAGNOSIS — N189 Chronic kidney disease, unspecified: Secondary | ICD-10-CM | POA: Diagnosis not present

## 2017-06-13 DIAGNOSIS — Z95810 Presence of automatic (implantable) cardiac defibrillator: Secondary | ICD-10-CM

## 2017-06-13 DIAGNOSIS — Z833 Family history of diabetes mellitus: Secondary | ICD-10-CM

## 2017-06-13 DIAGNOSIS — I482 Chronic atrial fibrillation, unspecified: Secondary | ICD-10-CM | POA: Diagnosis present

## 2017-06-13 DIAGNOSIS — N184 Chronic kidney disease, stage 4 (severe): Secondary | ICD-10-CM | POA: Diagnosis present

## 2017-06-13 DIAGNOSIS — I472 Ventricular tachycardia: Secondary | ICD-10-CM | POA: Diagnosis present

## 2017-06-13 DIAGNOSIS — Z955 Presence of coronary angioplasty implant and graft: Secondary | ICD-10-CM

## 2017-06-13 DIAGNOSIS — I509 Heart failure, unspecified: Secondary | ICD-10-CM

## 2017-06-13 DIAGNOSIS — R7989 Other specified abnormal findings of blood chemistry: Secondary | ICD-10-CM | POA: Diagnosis present

## 2017-06-13 DIAGNOSIS — E876 Hypokalemia: Secondary | ICD-10-CM | POA: Diagnosis present

## 2017-06-13 DIAGNOSIS — J90 Pleural effusion, not elsewhere classified: Secondary | ICD-10-CM | POA: Diagnosis not present

## 2017-06-13 DIAGNOSIS — M7989 Other specified soft tissue disorders: Secondary | ICD-10-CM | POA: Diagnosis not present

## 2017-06-13 DIAGNOSIS — D649 Anemia, unspecified: Secondary | ICD-10-CM

## 2017-06-13 DIAGNOSIS — H1132 Conjunctival hemorrhage, left eye: Secondary | ICD-10-CM | POA: Diagnosis present

## 2017-06-13 DIAGNOSIS — I5023 Acute on chronic systolic (congestive) heart failure: Secondary | ICD-10-CM | POA: Diagnosis not present

## 2017-06-13 DIAGNOSIS — Z8673 Personal history of transient ischemic attack (TIA), and cerebral infarction without residual deficits: Secondary | ICD-10-CM

## 2017-06-13 DIAGNOSIS — Z7901 Long term (current) use of anticoagulants: Secondary | ICD-10-CM

## 2017-06-13 DIAGNOSIS — Z560 Unemployment, unspecified: Secondary | ICD-10-CM

## 2017-06-13 DIAGNOSIS — Z8546 Personal history of malignant neoplasm of prostate: Secondary | ICD-10-CM

## 2017-06-13 DIAGNOSIS — E785 Hyperlipidemia, unspecified: Secondary | ICD-10-CM | POA: Diagnosis present

## 2017-06-13 DIAGNOSIS — I252 Old myocardial infarction: Secondary | ICD-10-CM

## 2017-06-13 DIAGNOSIS — I130 Hypertensive heart and chronic kidney disease with heart failure and stage 1 through stage 4 chronic kidney disease, or unspecified chronic kidney disease: Secondary | ICD-10-CM | POA: Diagnosis present

## 2017-06-13 DIAGNOSIS — IMO0002 Reserved for concepts with insufficient information to code with codable children: Secondary | ICD-10-CM | POA: Diagnosis present

## 2017-06-13 DIAGNOSIS — I6523 Occlusion and stenosis of bilateral carotid arteries: Secondary | ICD-10-CM | POA: Diagnosis present

## 2017-06-13 DIAGNOSIS — I480 Paroxysmal atrial fibrillation: Secondary | ICD-10-CM | POA: Diagnosis present

## 2017-06-13 DIAGNOSIS — Z79890 Hormone replacement therapy (postmenopausal): Secondary | ICD-10-CM

## 2017-06-13 DIAGNOSIS — E1129 Type 2 diabetes mellitus with other diabetic kidney complication: Secondary | ICD-10-CM | POA: Diagnosis present

## 2017-06-13 DIAGNOSIS — Z800 Family history of malignant neoplasm of digestive organs: Secondary | ICD-10-CM

## 2017-06-13 DIAGNOSIS — Z794 Long term (current) use of insulin: Secondary | ICD-10-CM

## 2017-06-13 LAB — BASIC METABOLIC PANEL WITH GFR
Anion gap: 12 (ref 5–15)
BUN: 68 mg/dL — ABNORMAL HIGH (ref 6–20)
CO2: 28 mmol/L (ref 22–32)
Calcium: 9.1 mg/dL (ref 8.9–10.3)
Chloride: 93 mmol/L — ABNORMAL LOW (ref 101–111)
Creatinine, Ser: 2.88 mg/dL — ABNORMAL HIGH (ref 0.61–1.24)
GFR calc Af Amer: 25 mL/min — ABNORMAL LOW
GFR calc non Af Amer: 22 mL/min — ABNORMAL LOW
Glucose, Bld: 194 mg/dL — ABNORMAL HIGH (ref 65–99)
Potassium: 3.3 mmol/L — ABNORMAL LOW (ref 3.5–5.1)
Sodium: 133 mmol/L — ABNORMAL LOW (ref 135–145)

## 2017-06-13 LAB — CBC WITH DIFFERENTIAL/PLATELET
Basophils Absolute: 0 K/uL (ref 0.0–0.1)
Basophils Relative: 1 %
Eosinophils Absolute: 0.2 K/uL (ref 0.0–0.7)
Eosinophils Relative: 4 %
HCT: 32.3 % — ABNORMAL LOW (ref 39.0–52.0)
Hemoglobin: 10.4 g/dL — ABNORMAL LOW (ref 13.0–17.0)
Lymphocytes Relative: 11 %
Lymphs Abs: 0.6 K/uL — ABNORMAL LOW (ref 0.7–4.0)
MCH: 32.1 pg (ref 26.0–34.0)
MCHC: 32.2 g/dL (ref 30.0–36.0)
MCV: 99.7 fL (ref 78.0–100.0)
Monocytes Absolute: 0.5 K/uL (ref 0.1–1.0)
Monocytes Relative: 10 %
Neutro Abs: 4.2 K/uL (ref 1.7–7.7)
Neutrophils Relative %: 75 %
Platelets: 117 K/uL — ABNORMAL LOW (ref 150–400)
RBC: 3.24 MIL/uL — ABNORMAL LOW (ref 4.22–5.81)
RDW: 16.9 % — ABNORMAL HIGH (ref 11.5–15.5)
WBC: 5.6 K/uL (ref 4.0–10.5)

## 2017-06-13 LAB — URINALYSIS, ROUTINE W REFLEX MICROSCOPIC
Bacteria, UA: NONE SEEN
Bilirubin Urine: NEGATIVE
Glucose, UA: NEGATIVE mg/dL
Ketones, ur: NEGATIVE mg/dL
Leukocytes, UA: NEGATIVE
NITRITE: NEGATIVE
PH: 6 (ref 5.0–8.0)
Protein, ur: NEGATIVE mg/dL
SPECIFIC GRAVITY, URINE: 1.008 (ref 1.005–1.030)
SQUAMOUS EPITHELIAL / LPF: NONE SEEN

## 2017-06-13 LAB — BRAIN NATRIURETIC PEPTIDE: B NATRIURETIC PEPTIDE 5: 1151 pg/mL — AB (ref 0.0–100.0)

## 2017-06-13 LAB — TROPONIN I: Troponin I: 0.04 ng/mL (ref <0.03)

## 2017-06-13 LAB — PROTIME-INR
INR: 2.18
Prothrombin Time: 24.6 s — ABNORMAL HIGH (ref 11.4–15.2)

## 2017-06-13 MED ORDER — SODIUM CHLORIDE 0.9 % IV SOLN: 250.0000 mL | INTRAVENOUS | Status: DC | PRN

## 2017-06-13 MED ORDER — INSULIN ASPART 100 UNIT/ML ~~LOC~~ SOLN
0.0000 [IU] | Freq: Every day | SUBCUTANEOUS | Status: DC
  Administered 2017-06-13: 3 [IU] via SUBCUTANEOUS
  Administered 2017-06-19: 2 [IU] via SUBCUTANEOUS

## 2017-06-13 MED ORDER — WARFARIN - PHARMACIST DOSING INPATIENT
Status: DC
  Administered 2017-06-13 – 2017-06-18 (×4)

## 2017-06-13 MED ORDER — WARFARIN SODIUM 2 MG PO TABS
2.0000 mg | ORAL_TABLET | Freq: Once | ORAL | Status: AC
  Administered 2017-06-13: 2 mg via ORAL
  Filled 2017-06-13: qty 1

## 2017-06-13 MED ORDER — SODIUM CHLORIDE 0.9% FLUSH
3.0000 mL | Freq: Two times a day (BID) | INTRAVENOUS | Status: DC
  Administered 2017-06-13 – 2017-06-21 (×13): 3 mL via INTRAVENOUS

## 2017-06-13 MED ORDER — PANTOPRAZOLE SODIUM 40 MG PO TBEC
40.0000 mg | DELAYED_RELEASE_TABLET | Freq: Every day | ORAL | Status: DC
  Administered 2017-06-13 – 2017-06-21 (×9): 40 mg via ORAL
  Filled 2017-06-13 (×9): qty 1

## 2017-06-13 MED ORDER — INSULIN GLARGINE 100 UNIT/ML ~~LOC~~ SOLN
5.0000 [IU] | Freq: Every day | SUBCUTANEOUS | Status: DC
  Filled 2017-06-13: qty 0.05

## 2017-06-13 MED ORDER — INSULIN GLARGINE 100 UNIT/ML ~~LOC~~ SOLN
15.0000 [IU] | Freq: Every day | SUBCUTANEOUS | Status: DC
  Administered 2017-06-13 – 2017-06-20 (×8): 15 [IU] via SUBCUTANEOUS
  Filled 2017-06-13 (×9): qty 0.15

## 2017-06-13 MED ORDER — LEVOTHYROXINE SODIUM 25 MCG PO TABS
25.0000 ug | ORAL_TABLET | Freq: Every day | ORAL | Status: DC
  Administered 2017-06-14 – 2017-06-21 (×8): 25 ug via ORAL
  Filled 2017-06-13 (×8): qty 1

## 2017-06-13 MED ORDER — FUROSEMIDE 10 MG/ML IJ SOLN
40.0000 mg | Freq: Once | INTRAMUSCULAR | Status: AC
  Administered 2017-06-13: 40 mg via INTRAVENOUS
  Filled 2017-06-13: qty 4

## 2017-06-13 MED ORDER — POTASSIUM CHLORIDE CRYS ER 20 MEQ PO TBCR: 20.0000 meq | EXTENDED_RELEASE_TABLET | Freq: Every day | ORAL | Status: DC

## 2017-06-13 MED ORDER — INSULIN ASPART 100 UNIT/ML ~~LOC~~ SOLN
5.0000 [IU] | Freq: Three times a day (TID) | SUBCUTANEOUS | Status: DC
  Administered 2017-06-14 – 2017-06-21 (×19): 5 [IU] via SUBCUTANEOUS

## 2017-06-13 MED ORDER — METOPROLOL SUCCINATE ER 25 MG PO TB24
12.5000 mg | ORAL_TABLET | Freq: Two times a day (BID) | ORAL | Status: DC
  Administered 2017-06-13: 12.5 mg via ORAL
  Filled 2017-06-13: qty 1

## 2017-06-13 MED ORDER — FUROSEMIDE 10 MG/ML IJ SOLN
40.0000 mg | Freq: Two times a day (BID) | INTRAMUSCULAR | Status: DC
  Administered 2017-06-13 – 2017-06-14 (×2): 40 mg via INTRAVENOUS
  Filled 2017-06-13 (×2): qty 4

## 2017-06-13 MED ORDER — SODIUM CHLORIDE 0.9% FLUSH: 3.0000 mL | INTRAVENOUS | Status: DC | PRN

## 2017-06-13 MED ORDER — ATORVASTATIN CALCIUM 40 MG PO TABS
40.0000 mg | ORAL_TABLET | Freq: Every day | ORAL | Status: DC
  Administered 2017-06-13 – 2017-06-20 (×8): 40 mg via ORAL
  Filled 2017-06-13 (×8): qty 1

## 2017-06-13 MED ORDER — INSULIN ASPART 100 UNIT/ML ~~LOC~~ SOLN
0.0000 [IU] | Freq: Three times a day (TID) | SUBCUTANEOUS | Status: DC
  Administered 2017-06-14: 2 [IU] via SUBCUTANEOUS
  Administered 2017-06-14: 1 [IU] via SUBCUTANEOUS
  Administered 2017-06-14 – 2017-06-15 (×3): 2 [IU] via SUBCUTANEOUS
  Administered 2017-06-16: 1 [IU] via SUBCUTANEOUS
  Administered 2017-06-16: 5 [IU] via SUBCUTANEOUS
  Administered 2017-06-17 – 2017-06-18 (×2): 1 [IU] via SUBCUTANEOUS
  Administered 2017-06-18: 2 [IU] via SUBCUTANEOUS
  Administered 2017-06-18: 3 [IU] via SUBCUTANEOUS
  Administered 2017-06-19 – 2017-06-20 (×4): 2 [IU] via SUBCUTANEOUS
  Administered 2017-06-20: 1 [IU] via SUBCUTANEOUS
  Administered 2017-06-20: 2 [IU] via SUBCUTANEOUS
  Administered 2017-06-21: 1 [IU] via SUBCUTANEOUS

## 2017-06-13 NOTE — Telephone Encounter (Signed)
He needs to come to hospital to get fluid removed. He has gained so much weight, and with his underlying kidney troubles will need to be monitored closely while getting the fluid off. Recommend coming into ER and being admitted   Robert Abts MD

## 2017-06-13 NOTE — Telephone Encounter (Signed)
Pt called to update Dr. Harl Bowie on his swelling. He states that his legs are still swollen (even worse) then last week. He is feeling a little more SOB.  He has gained another 2 lbs. (191 lbs- currently) He complained that his legs are starting to get "water blisters" on them. He was wondering what else he could do. Please advise.

## 2017-06-13 NOTE — ED Notes (Signed)
CRITICAL VALUE ALERT  Critical Value:  Troponin 0.04  Date & Time Notied:  06/13/17 1313  Provider Notified: Dr. Thurnell Garbe   Orders Received/Actions taken: Notified MD

## 2017-06-13 NOTE — Progress Notes (Signed)
Dr. Sarajane Jews notified via text page of patient's arrival to room 333.

## 2017-06-13 NOTE — ED Triage Notes (Signed)
Pt reports being sent by Dr. Harl Bowie for admission for fluid retention.  Has gained significant weight and his meds have been adjusted with no improvement.

## 2017-06-13 NOTE — Telephone Encounter (Signed)
Spoke with Pt., let him know of Dr. Nelly Laurence recommendations. He voiced understanding and will go to ED as soon as his wife returns home.

## 2017-06-13 NOTE — ED Provider Notes (Signed)
[facility] DEPT Provider Note   CSN: [id number] Arrival date & time: 06/13/17  1152     History   Chief Complaint Chief Complaint  Patient presents with  . Leg Swelling    HPI [name] is a 61 y.o. male.  HPI  Pt was seen at 1200. Per pt, c/o gradual onset and worsening of persistent "gaining weight" for the past 1 month. Pt states he has gained 12+ pounds "of fluid" over the past 1 month. Has been associated with DOE. Pt endorses hx of CHF and being compliant with meds. Pt states he was told by his Cards MD to come to the ED for evaluation and admission "to get the fluid off of me." Denies abd pain, no N/V/D, no back pain, no fevers, no rash, no CP/palpitations, no cough.   Past Medical History:  Diagnosis Date  . Adenocarcinoma of rectum ([place])    a. 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 AICD (automatic cardioverter/defibrillator) present 2002   BI V ICD  . Anemia   . CAD (coronary artery disease)    a. BMS to LAD 2001 at [facility] b. PTCA/atherectomy ramus and BMS to LAD 2009  . CHF (congestive heart failure) ([place])   . Cholelithiasis 06/2015  . Chronic kidney disease, stage IV (severe) ([place])   . Chronic systolic heart failure ([place])   . Colostomy in place [facility])   . Dizziness    a. chronic. Admission for this 07/18/2014  . DM type 2, uncontrolled, with renal complications ([place])   . Dysrhythmia   . Essential hypertension, benign   . GERD (gastroesophageal reflux disease)   . HCAP (healthcare-associated pneumonia) 07/21/2015  . Hematuria   . History of blood transfusion    "I've had 2 units so far this year" (09/27/2015)  . HLD (hyperlipidemia)   . Ischemic cardiomyopathy    EF 18% by nuclear study 2016, multiple myocardial infarctions in past    . Myocardial infarction ([place]) 2001  . Obesity   . Orthostatic hypotension   . OSA on CPAP   . Paroxysmal atrial fibrillation (HCC)    a. on amiodarone, digoxin and Eliquis  . PONV (postoperative nausea and vomiting)   . Prostate cancer  ([place])    a. s/p seed implants with chemo and radiation  . SAH (subarachnoid hemorrhage) ([place])    post-traumatic (fall) [facility] 12/2014  . TIA (transient ischemic attack)     Patient Active Problem List   Diagnosis Date Noted  . GI bleed 04/27/2017  . Portal hypertensive gastropathy ([place]) 04/27/2017  . Hypokalemia 04/27/2017  . Esophageal dysphagia 04/13/2017  . Hepatic cirrhosis ([place]) 02/24/2017  . Acute on chronic systolic CHF (congestive heart failure) ([place])   . Biliary disease with obstruction   . AKI (acute kidney injury) ([place])   . Cholecystitis 06/09/2016  . Abnormal liver function   . Abnormal LFTs 05/10/2016  . Chest pain 05/10/2016  . Acute cholecystitis 05/10/2016  . Pain in the chest   . CKD (chronic kidney disease) 11/20/2015  . Aftercare following surgery of the circulatory system 11/20/2015  . Right heart failure ([place]) 09/26/2015  . History of colonic polyps 09/24/2015  . Chronic atrial fibrillation ([place])   . Bleeding from colostomy stoma ([place]) 08/23/2015  . Atrial fibrillation, chronic ([place]) 08/07/2015  . Anemia of chronic disease 07/21/2015  . Constipation 05/01/2015  . Transaminitis 05/01/2015  . NSTEMI- 04/23/15- Myoview scar- no cath 04/27/2015  . Anemia-transfused 2 units May 2016 04/27/2015  . Chronic cholecystitis 04/04/2015  .  Cystitis with hematuria-April 2016 04/04/2015  . Chronically elevated transaminase level-Amiodarone resumed 04/24/15 04/03/2015  . H/O ventricular fibrillation-March 2016 02/24/2015  . Cardiogenic shock ([place]) 02/24/2015  . BiV ICD (St Jude) gen change 04/10/15 02/24/2015  . Acute on chronic systolic congestive heart failure ([place])   . Hyperlipidemia   . Dietary noncompliance   . Orthostatic hypotension 07/19/2014  . OSA on CPAP   . GERD (gastroesophageal reflux disease) 06/05/2014  . Gallbladder polyp 03/06/2014  . DM type 2, uncontrolled, with renal complications ([place]) 12/14/3233  . Dizziness 01/26/2014  . Obesity 12/12/2013  .  Chronic hypotension 12/11/2013  . Chronic kidney disease, stage IV (severe) ([place]) 12/11/2013  . Paroxysmal atrial fibrillation (HCC)   . Chronic systolic heart failure ([place]) 08/12/2013  . CAD with LCX disease and stents to LAD 07/10/2013  . Long term current use of anticoagulant therapy 03/23/2013  . History of rectal cancer   . Ischemic cardiomyopathy-EF 35% 04/24/15 echo     Past Surgical History:  Procedure Laterality Date  . Abdominal and Perineal Resection of Rectum with Total Mesorectal Excision  10/04/2007  . AV FISTULA PLACEMENT Right 09/16/2015   Procedure: ARTERIOVENOUS (AV) FISTULA CREATION - BRACHIOCEPHALIC;  Surgeon: Elam Dutch, MD;  Location: [facility];  Service: Vascular;  Laterality: Right;  . BI-VENTRICULAR IMPLANTABLE CARDIOVERTER DEFIBRILLATOR  (CRT-D)  2009  . CARDIAC CATHETERIZATION  08/2001; 2009   ; [name] 07/10/2013  . CARDIAC DEFIBRILLATOR PLACEMENT  2002  . CARDIAC DEFIBRILLATOR PLACEMENT  2009   Upgraded to a BiV ICD  . COLONOSCOPY  09/14/2011   Dr. Gala Romney: via colostomy, Single pedunculated benign inflammatory polyp. Due for surveillance Oct 2015  . COLONOSCOPY N/A 07/02/2014   Procedure: COLONOSCOPY;  Surgeon: Daneil Dolin, MD;  [facility];  Service: Endoscopy;  Laterality: N/A;  7:30 / COLONOSCOPY THRU COLOSTOMY  . COLONOSCOPY N/A 12/11/2014   Dr. Gala Romney via colostomy. Normal. Repeat in 2021.   Marland Kitchen COLONOSCOPY N/A 08/24/2015   Dr. Havery Moros: diminutive polyp not removed, stoma site of bleeding  . COLOSTOMY  2008  . CORONARY ANGIOPLASTY WITH STENT PLACEMENT  2001; ~ 2006   "1 + 1"   . ELECTROPHYSIOLOGIC STUDY N/A 08/28/2015   Procedure: AV Node Ablation;  Surgeon: Will Meredith Leeds, MD;  Location: [facility] CV LAB;  Service: Cardiovascular;  Laterality: N/A;  . EP IMPLANTABLE DEVICE N/A 04/10/2015   Procedure: Ppm/Biv Ppm Generator Changeout;  Surgeon: Evans Lance, MD;  Location: [name] INVASIVE CV LAB CUPID;  Service: Cardiovascular;  Laterality:  N/A;  . ERCP N/A 06/11/2016   Procedure: ENDOSCOPIC RETROGRADE CHOLANGIOPANCREATOGRAPHY (ERCP);  Surgeon: Teena Irani, MD;  Location: [facility] ENDOSCOPY;  Service: Endoscopy;  Laterality: N/A;  . ERCP N/A 01/26/2017   Procedure: ENDOSCOPIC RETROGRADE CHOLANGIOPANCREATOGRAPHY (ERCP);  Surgeon: Teena Irani, MD;  Location: [facility] ENDOS[facility];  Service: Endoscopy;  Laterality: N/A;  . ERCP  06/2016   Dr. Amedeo Plenty: duodenal fistula, dilated bile duct but no obvious stones, s/p sphincterotomy and stent placement  . ERCP  01/2017   Dr. Amedeo Plenty: CBD stones, s/p sphincterotomy, balloon extraction, stent removal  . ESOPHAGOGASTRODUODENOSCOPY N/A 07/02/2014   Procedure: ESOPHAGOGASTRODUODENOSCOPY (EGD);  Surgeon: Daneil Dolin, MD;  [facility];  Service: Endoscopy;  Laterality: N/A;  7:30  . ESOPHAGOGASTRODUODENOSCOPY N/A 12/11/2014   TDD:[place] EGD  . ESOPHAGOGASTRODUODENOSCOPY (EGD) WITH PROPOFOL N/A 04/18/2017   Procedure: ESOPHAGOGASTRODUODENOSCOPY (EGD) WITH PROPOFOL;  Surgeon: Daneil Dolin, MD;  [facility];  Service: Endoscopy;  Laterality:  N/A;  2:30pm  . GASTROINTESTINAL STENT REMOVAL N/A 01/26/2017   Procedure: GASTROINTESTINAL STENT REMOVAL;  Surgeon: Teena Irani, MD;  Location: [facility];  Service: Endoscopy;  Laterality: N/A;  . GIVENS CAPSULE STUDY N/A 07/23/2015   Dr. Michail Sermon: small non-bleeding AVM, mild gastritis  . LEFT HEART CATHETERIZATION WITH CORONARY ANGIOGRAM N/A 07/13/2013   Procedure: LEFT HEART CATHETERIZATION WITH CORONARY ANGIOGRAM;  Surgeon: Lorretta Harp, MD;  Location: [facility] CATH LAB;  Service: Cardiovascular;  Laterality: N/A;  . Venia Minks DILATION N/A 07/02/2014   Procedure: Venia Minks DILATION;  Surgeon: Daneil Dolin, MD;  [facility];  Service: Endoscopy;  Laterality: N/A;  7:30  . MALONEY DILATION N/A 04/18/2017   Procedure: Venia Minks DILATION;  Surgeon: Daneil Dolin, MD;  [facility];  Service: Endoscopy;  Laterality: N/A;  . PORTACATH  PLACEMENT  06/2007   "removed ~ 1 yr later"  . RIGHT HEART CATHETERIZATION N/A 02/24/2015   Procedure: RIGHT HEART CATH;  Surgeon: Jolaine Artist, MD;  Location: [facility] LAB;  Service: Cardiovascular;  Laterality: N/A;  . SAVORY DILATION N/A 07/02/2014   Procedure: SAVORY DILATION;  Surgeon: Daneil Dolin, MD;  [facility];  Service: Endoscopy;  Laterality: N/A;  7:30       Home Medications    Prior to Admission medications   Medication Sig Start Date End Date Taking? Authorizing Provider  atorvastatin (LIPITOR) 40 MG tablet Take 1 tablet (40 mg total) by mouth daily. Patient taking differently: Take 40 mg by mouth daily at 6 PM.  02/15/17 05/26/17  Bensimhon, Shaune Pascal, MD  Coenzyme Q10 (COQ10) 50 MG CAPS Take 1 capsule by mouth daily.    [provider]  collagenase (SANTYL) ointment Apply topically daily. 04/28/17   Lavina Hamman, MD  ferrous sulfate 325 (65 FE) MG tablet Take 1 tablet (325 mg total) by mouth 3 (three) times daily with meals. 04/28/17   Lavina Hamman, MD  glucose blood test strip Use to test blood sugar 3 times daily 03/23/17   Elayne Snare, MD  insulin aspart (NOVOLOG FLEXPEN) 100 UNIT/ML FlexPen 5 units before each meal Patient taking differently: Inject 5 Units into the skin 3 (three) times daily with meals. 5 units before each meal 03/23/17   Elayne Snare, MD  Insulin Glargine (LANTUS SOLOSTAR) 100 UNIT/ML Solostar Pen Inject 15 Units into the skin daily at 10 pm. 04/13/17   Elayne Snare, MD  levothyroxine (SYNTHROID, LEVOTHROID) 25 MCG tablet Take 25 mcg by mouth daily before breakfast.    [provider]  metolazone (ZAROXOLYN) 2.5 MG tablet take 1 tablet by mouth weekly if needed for SWELLING 04/19/17   Arnoldo Lenis, MD  metoprolol succinate (TOPROL-XL) 25 MG 24 hr tablet take 1/2 tablet by mouth twice a day 01/03/17   Bensimhon, Shaune Pascal, MD  nitroGLYCERIN (NITROSTAT) 0.4 MG SL tablet Place 1 tablet (0.4 mg total) under the tongue  every 5 (five) minutes as needed for chest pain. 06/10/17 09/08/17  Arnoldo Lenis, MD  pantoprazole (PROTONIX) 40 MG tablet Take 1 tablet (40 mg total) by mouth daily. 05/07/16   Shirley Friar, PA-C  polyethylene glycol (MIRALAX / Floria Raveling) packet Take 17 g by mouth daily as needed for mild constipation. 04/28/17   Lavina Hamman, MD  potassium chloride SA (K-DUR,KLOR-CON) 20 MEQ tablet Take 20 mEq by mouth daily.    [provider]  PROAIR RESPICLICK 332 (90 BASE) MCG/ACT AEPB Inhale 2 puffs  into the lungs every 6 (six) hours as needed (for breathing).  08/15/15   [provider]  torsemide (DEMADEX) 20 MG tablet TAKE 4 TABLETS IN THE MORNING AND 2 TABLET IN THE EVENING 06/10/17   Arnoldo Lenis, MD  warfarin (COUMADIN) 2 MG tablet Take 1 tablet daily except 1/2 tablet on Tuesdays and Fridays 05/26/17   Bensimhon, Shaune Pascal, MD    Family History Family History  Problem Relation Age of Onset  . Colon cancer Mother 36  . Coronary artery disease Father   . Colon cancer Sister 71  . Diabetes Sister   . Colon cancer Other        2 cousins, succumbed to illness    Social History Social History  Substance Use Topics  . Smoking status: Never Smoker  . Smokeless tobacco: Never Used  . Alcohol use No     Comment: Former user 45 years ago     Allergies   Patient has no known allergies.   Review of Systems Review of Systems ROS: Statement: All systems negative except as marked or noted in the HPI; Constitutional: Negative for fever and chills. ; ; Eyes: Negative for eye pain, redness and discharge. ; ; ENMT: Negative for ear pain, hoarseness, nasal congestion, sinus pressure and sore throat. ; ; Cardiovascular: Negative for chest pain, palpitations, diaphoresis, +DOE, +peripheral edema. ; ; Respiratory: Negative for cough, wheezing and stridor. ; ; Gastrointestinal: Negative for nausea, vomiting, diarrhea, abdominal pain, blood in stool, hematemesis, jaundice and  rectal bleeding. . ; ; Genitourinary: Negative for dysuria, flank pain and hematuria. ; ; Musculoskeletal: Negative for back pain and neck pain. Negative for swelling and trauma.; ; Skin: Negative for pruritus, rash, abrasions, blisters, bruising and skin lesion.; ; Neuro: Negative for headache, lightheadedness and neck stiffness. Negative for weakness, altered level of consciousness, altered mental status, extremity weakness, paresthesias, involuntary movement, seizure and syncope.       Physical Exam Updated Vital Signs BP 101/72 (BP Location: Left Arm)   Pulse 88   Temp 97.7 F (36.5 C) (Oral)   Resp 20   Ht 5\' 10"  (1.778 m)   Wt 87.1 kg (192 lb)   SpO2 95%   BMI 27.55 kg/m     Physical Exam 1205: Physical examination:  Nursing notes reviewed; Vital signs and O2 SAT reviewed;  Constitutional: Well developed, Well nourished, Well hydrated, In no acute distress; Head:  Normocephalic, atraumatic; Eyes: EOMI, PERRL, No scleral icterus; ENMT: Mouth and pharynx normal, Mucous membranes moist; Neck: Supple, Full range of motion, No lymphadenopathy; Cardiovascular: Regular rate and rhythm, No gallop; Respiratory: Breath sounds coarse & equal bilaterally, No wheezes.  Speaking full sentences with ease, Normal respiratory effort/excursion; Chest: Nontender, Movement normal; Abdomen: Soft, Nontender, Nondistended, Normal bowel sounds; Genitourinary: No CVA tenderness; Extremities: Pulses normal, No tenderness, +1-2 pedal edema bilat feet to thighs. No calf asymmetry.; Neuro: AA&Ox3, Major CN grossly intact.  Speech clear. No gross focal motor or sensory deficits in extremities.; Skin: Color normal, Warm, Dry.   ED Treatments / Results  Labs (all labs ordered are listed, but only abnormal results are displayed)   EKG  EKG Interpretation  Date/Time:  Monday June 13 2017 12:29:50 EDT Ventricular Rate:  70 PR Interval:    QRS Duration: 148 QT Interval:  494 QTC Calculation: 534 R  Axis:   161 Text Interpretation:  Afib/flut and V-paced complexes No further analysis attempted due to paced rhythm When compared with ECG of 04/14/2017 No  significant change was found Confirmed by [facility]  MD, Nunzio Cory [phone]) on 06/13/2017 12:34:04 PM       Radiology   Procedures Procedures (including critical care time)  Medications Ordered in ED Medications - No data to display   Initial Impression / Assessment and Plan / ED Course  I have reviewed the triage vital signs and the nursing notes.  Pertinent labs & imaging results that were available during my care of the patient were reviewed by me and considered in my medical decision making (see chart for details).  MDM Reviewed: previous chart, nursing note and vitals Reviewed previous: labs and ECG Interpretation: labs, ECG and x-ray    Results for orders placed or performed during the hospital encounter of 60/73/71  Basic metabolic panel  Result Value Ref Range   Sodium 133 (L) 135 - 145 mmol/L   Potassium 3.3 (L) 3.5 - 5.1 mmol/L   Chloride 93 (L) 101 - 111 mmol/L   CO2 28 22 - 32 mmol/L   Glucose, Bld 194 (H) 65 - 99 mg/dL   BUN 68 (H) 6 - 20 mg/dL   Creatinine, Ser 2.88 (H) 0.61 - 1.24 mg/dL   Calcium 9.1 8.9 - 10.3 mg/dL   GFR calc non Af Amer 22 (L) >60 mL/min   GFR calc Af Amer 25 (L) >60 mL/min   Anion gap 12 5 - 15  Brain natriuretic peptide  Result Value Ref Range   B Natriuretic Peptide 1,151.0 (H) 0.0 - 100.0 pg/mL  Troponin I  Result Value Ref Range   Troponin I 0.04 (HH) <0.03 ng/mL  CBC with Differential  Result Value Ref Range   WBC 5.6 4.0 - 10.5 K/uL   RBC 3.24 (L) 4.22 - 5.81 MIL/uL   Hemoglobin 10.4 (L) 13.0 - 17.0 g/dL   HCT 32.3 (L) 39.0 - 52.0 %   MCV 99.7 78.0 - 100.0 fL   MCH 32.1 26.0 - 34.0 pg   MCHC 32.2 30.0 - 36.0 g/dL   RDW 16.9 (H) 11.5 - 15.5 %   Platelets 117 (L) 150 - 400 K/uL   Neutrophils Relative % 75 %   Neutro Abs 4.2 1.7 - 7.7 K/uL   Lymphocytes Relative 11 %    Lymphs Abs 0.6 (L) 0.7 - 4.0 K/uL   Monocytes Relative 10 %   Monocytes Absolute 0.5 0.1 - 1.0 K/uL   Eosinophils Relative 4 %   Eosinophils Absolute 0.2 0.0 - 0.7 K/uL   Basophils Relative 1 %   Basophils Absolute 0.0 0.0 - 0.1 K/uL  Protime-INR  Result Value Ref Range   Prothrombin Time 24.6 (H) 11.4 - 15.2 seconds   INR 2.18    Dg Chest 2 View Result Date: 06/13/2017 CLINICAL DATA:  Fluid retention, no improvement with medication EXAM: CHEST  2 VIEW COMPARISON:  06/09/2016 FINDINGS: There is a small right pleural effusion with right basilar atelectasis. There is mild bilateral interstitial thickening. There is no left pleural effusion. There is no pneumothorax. There is mild stable cardiomegaly. There is a 3 lead cardiac pacemaker. The osseous structures are unremarkable. IMPRESSION: 1. Small right pleural effusion. Electronically Signed   By: Kathreen Devoid   On: 06/13/2017 13:30   Results for [name], [name] (MRN [id number]) as of 06/13/2017 13:08  Ref. Range 08/02/2015 21:29 09/26/2015 18:20 05/09/2016 23:43 06/01/2016 12:00 06/13/2017 12:05  B Natriuretic Peptide Latest Ref Range: 0.0 - 100.0 pg/mL 1,004.6 (H) 1,393.4 (H) 377.6 (H) 1,016.7 (H) 1,151.0 (H)   Results  for [name], [name] (MRN [id number]) as of 06/13/2017 14:42  Ref. Range 04/27/2017 11:27 04/27/2017 18:16 04/28/2017 03:18 06/13/2017 12:05  BUN Latest Ref Range: 6 - 20 mg/dL 94 (H) 93 (H) 91 (H) 68 (H)  Creatinine Latest Ref Range: 0.61 - 1.24 mg/dL 3.20 (H) 3.18 (H) 3.03 (H) 2.88 (H)    1440:  BUN/Cr per baseline. BNP elevated with pleural effusion on CXR; IV lasix given. Troponin chronically elevated, H/H/platelets per baseline. T/C to [facility] Dr. Sarajane Jews, case discussed, including:  HPI, pertinent PM/SHx, VS/PE, dx testing, ED course and treatment:  Agreeable to admit.     Final Clinical Impressions(s) / ED Diagnoses   Final diagnoses:  None    New Prescriptions New Prescriptions   No medications on file     Francine Graven, DO 06/17/17 1513

## 2017-06-13 NOTE — Progress Notes (Signed)
ANTICOAGULATION CONSULT NOTE - Initial Consult  Pharmacy Consult for Coumadin Indication : AFIB  No Known Allergies  Patient Measurements: Height: 5\' 10"  (177.8 cm) Weight: 195 lb 9.6 oz (88.7 kg) IBW/kg (Calculated) : 73 Heparin Dosing Weight:  Vital Signs: Temp: 97.8 F (36.6 C) (07/09 1615) Temp Source: Oral (07/09 1615) BP: 115/67 (07/09 1615) Pulse Rate: 74 (07/09 1615)  Labs:  Recent Labs  06/13/17 1205  HGB 10.4*  HCT 32.3*  PLT 117*  LABPROT 24.6*  INR 2.18  CREATININE 2.88*  TROPONINI 0.04*    Estimated Creatinine Clearance: 29.8 mL/min (A) (by C-G formula based on SCr of 2.88 mg/dL (H)).   Medical History: Past Medical History:  Diagnosis Date  . Adenocarcinoma of rectum ([place])    a. 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 AICD (automatic cardioverter/defibrillator) present 2002   BI V ICD  . Anemia   . CAD (coronary artery disease)    a. BMS to LAD 2001 at [facility] b. PTCA/atherectomy ramus and BMS to LAD 2009  . CHF (congestive heart failure) ([place])   . Cholelithiasis 06/2015  . Chronic kidney disease, stage IV (severe) ([place])   . Chronic systolic heart failure ([place])   . Colostomy in place [facility])   . Dizziness    a. chronic. Admission for this 07/18/2014  . DM type 2, uncontrolled, with renal complications ([place])   . Dysrhythmia   . Essential hypertension, benign   . GERD (gastroesophageal reflux disease)   . HCAP (healthcare-associated pneumonia) 07/21/2015  . Hematuria   . History of blood transfusion    "I've had 2 units so far this year" (09/27/2015)  . HLD (hyperlipidemia)   . Ischemic cardiomyopathy    EF 18% by nuclear study 2016, multiple myocardial infarctions in past    . Myocardial infarction ([place]) 2001  . Obesity   . Orthostatic hypotension   . OSA on CPAP   . Paroxysmal atrial fibrillation (HCC)    a. on amiodarone, digoxin and Eliquis  . PONV (postoperative nausea and vomiting)   . Prostate cancer ([place])    a. s/p seed implants with chemo and radiation   . SAH (subarachnoid hemorrhage) ([place])    post-traumatic (fall) [facility] 12/2014  . TIA (transient ischemic attack)      Assessment: Continuation of Coumadin for AFIB PTA INR 2.18 Labs reviewed PTA medications reviewed  Goal of Therapy:  INR 2-3 Monitor platelets by anticoagulation protocol    Plan:  Coumadin 2 mg po tonight (home regiment) INR/PT daily Monitor for signs of bleeding, CBC, platelets  [name] 06/13/2017,6:55 PM

## 2017-06-13 NOTE — ED Notes (Signed)
Pt stable and ready for transport to [facility].  Report given to Selinda Orion, RN.

## 2017-06-13 NOTE — H&P (Signed)
History and Physical  MANPREET STREY PPJ:[id number] DOB: 07-11-1956 DOA: 06/13/2017  PCP: Redmond School, MD  Patient coming from: Home  Chief Complaint: Swollen legs  HPI:  61 year old man PMH chronic systolic congestive heart failure, ischemic cardiomyopathy, coronary artery disease, atrial fibrillation/flutter presented to the emergency department with increasing weight gain and leg edema. Admitted for acute on chronic systolic congestive heart failure.  Patient reports increasing lower extremity edema over the last days to weeks despite diuretic use as directed by his cardiologist, this has been associated with increasing leg pain which is now severe and without alleviating factor. He reckons he's gained approximately 18 pounds. Because of increasing worsening edema he was referred to the emergency department for further evaluation. No chest pain.  ED Course: 97.7, 16, 63, 98/64, 95% on room air   Review of Systems:  Negative for fever, visual changes, sore throat, new muscle aches other than leg pain, chest pain shortness of breath, dysuria, bleeding, nausea, vomiting, abdominal pain.  Positive for rash on the right side of his neck and chest.   Past Medical History:  Diagnosis Date  . Adenocarcinoma of rectum ([place])    a. 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 AICD (automatic cardioverter/defibrillator) present 2002   BI V ICD  . Anemia   . CAD (coronary artery disease)    a. BMS to LAD 2001 at [facility] b. PTCA/atherectomy ramus and BMS to LAD 2009  . CHF (congestive heart failure) ([place])   . Cholelithiasis 06/2015  . Chronic kidney disease, stage IV (severe) ([place])   . Chronic systolic heart failure ([place])   . Colostomy in place [facility])   . Dizziness    a. chronic. Admission for this 07/18/2014  . DM type 2, uncontrolled, with renal complications ([place])   . Dysrhythmia   . Essential hypertension, benign   . GERD (gastroesophageal reflux disease)   . HCAP (healthcare-associated pneumonia) 07/21/2015  .  Hematuria   . History of blood transfusion    "I've had 2 units so far this year" (09/27/2015)  . HLD (hyperlipidemia)   . Ischemic cardiomyopathy    EF 18% by nuclear study 2016, multiple myocardial infarctions in past    . Myocardial infarction ([place]) 2001  . Obesity   . Orthostatic hypotension   . OSA on CPAP   . Paroxysmal atrial fibrillation (HCC)    a. on amiodarone, digoxin and Eliquis  . PONV (postoperative nausea and vomiting)   . Prostate cancer ([place])    a. s/p seed implants with chemo and radiation  . SAH (subarachnoid hemorrhage) ([place])    post-traumatic (fall) [facility] 12/2014  . TIA (transient ischemic attack)     Past Surgical History:  Procedure Laterality Date  . Abdominal and Perineal Resection of Rectum with Total Mesorectal Excision  10/04/2007  . AV FISTULA PLACEMENT Right 09/16/2015   Procedure: ARTERIOVENOUS (AV) FISTULA CREATION - BRACHIOCEPHALIC;  Surgeon: Elam Dutch, MD;  Location: [place];  Service: Vascular;  Laterality: Right;  . BI-VENTRICULAR IMPLANTABLE CARDIOVERTER DEFIBRILLATOR  (CRT-D)  2009  . CARDIAC CATHETERIZATION  08/2001; 2009   ; [name] 07/10/2013  . CARDIAC DEFIBRILLATOR PLACEMENT  2002  . CARDIAC DEFIBRILLATOR PLACEMENT  2009   Upgraded to a BiV ICD  . COLONOSCOPY  09/14/2011   Dr. Gala Romney: via colostomy, Single pedunculated benign inflammatory polyp. Due for surveillance Oct 2015  . COLONOSCOPY N/A 07/02/2014   Procedure: COLONOSCOPY;  Surgeon: Daneil Dolin, MD;  [facility];  Service: Endoscopy;  Laterality: N/A;  7:30 / COLONOSCOPY THRU COLOSTOMY  . COLONOSCOPY N/A 12/11/2014   Dr. Gala Romney via colostomy. Normal. Repeat in 2021.   Marland Kitchen COLONOSCOPY N/A 08/24/2015   Dr. Havery Moros: diminutive polyp not removed, stoma site of bleeding  . COLOSTOMY  2008  . CORONARY ANGIOPLASTY WITH STENT PLACEMENT  2001; ~ 2006   "1 + 1"   . ELECTROPHYSIOLOGIC STUDY N/A 08/28/2015   Procedure: AV Node Ablation;  Surgeon: Will Meredith Leeds, MD;   Location: [place] CV LAB;  Service: Cardiovascular;  Laterality: N/A;  . EP IMPLANTABLE DEVICE N/A 04/10/2015   Procedure: Ppm/Biv Ppm Generator Changeout;  Surgeon: Evans Lance, MD;  Location: [place] INVASIVE CV LAB CUPID;  Service: Cardiovascular;  Laterality: N/A;  . ERCP N/A 06/11/2016   Procedure: ENDOSCOPIC RETROGRADE CHOLANGIOPANCREATOGRAPHY (ERCP);  Surgeon: Teena Irani, MD;  Location: [facility] ENDOSCOPY;  Service: Endoscopy;  Laterality: N/A;  . ERCP N/A 01/26/2017   Procedure: ENDOSCOPIC RETROGRADE CHOLANGIOPANCREATOGRAPHY (ERCP);  Surgeon: Teena Irani, MD;  Location: [facility] ENDOSCOPY;  Service: Endoscopy;  Laterality: N/A;  . ERCP  06/2016   Dr. Amedeo Plenty: duodenal fistula, dilated bile duct but no obvious stones, s/p sphincterotomy and stent placement  . ERCP  01/2017   Dr. Amedeo Plenty: CBD stones, s/p sphincterotomy, balloon extraction, stent removal  . ESOPHAGOGASTRODUODENOSCOPY N/A 07/02/2014   Procedure: ESOPHAGOGASTRODUODENOSCOPY (EGD);  Surgeon: Daneil Dolin, MD;  [facility];  Service: Endoscopy;  Laterality: N/A;  7:30  . ESOPHAGOGASTRODUODENOSCOPY N/A 12/11/2014   RXV:QMGQQP EGD  . ESOPHAGOGASTRODUODENOSCOPY (EGD) WITH PROPOFOL N/A 04/18/2017   Procedure: ESOPHAGOGASTRODUODENOSCOPY (EGD) WITH PROPOFOL;  Surgeon: Daneil Dolin, MD;  [facility];  Service: Endoscopy;  Laterality: N/A;  2:30pm  . GASTROINTESTINAL STENT REMOVAL N/A 01/26/2017   Procedure: GASTROINTESTINAL STENT REMOVAL;  Surgeon: Teena Irani, MD;  Location: [place];  Service: Endoscopy;  Laterality: N/A;  . GIVENS CAPSULE STUDY N/A 07/23/2015   Dr. Michail Sermon: small non-bleeding AVM, mild gastritis  . LEFT HEART CATHETERIZATION WITH CORONARY ANGIOGRAM N/A 07/13/2013   Procedure: LEFT HEART CATHETERIZATION WITH CORONARY ANGIOGRAM;  Surgeon: Lorretta Harp, MD;  Location: [facility] CATH LAB;  Service: Cardiovascular;  Laterality: N/A;  . Venia Minks DILATION N/A 07/02/2014   Procedure: Venia Minks DILATION;  Surgeon: Daneil Dolin, MD;  [facility];  Service: Endoscopy;  Laterality: N/A;  7:30  . MALONEY DILATION N/A 04/18/2017   Procedure: Venia Minks DILATION;  Surgeon: Daneil Dolin, MD;  [facility];  Service: Endoscopy;  Laterality: N/A;  . PORTACATH PLACEMENT  06/2007   "removed ~ 1 yr later"  . RIGHT HEART CATHETERIZATION N/A 02/24/2015   Procedure: RIGHT HEART CATH;  Surgeon: Jolaine Artist, MD;  Location: [facility] CATH LAB;  Service: Cardiovascular;  Laterality: N/A;  . SAVORY DILATION N/A 07/02/2014   Procedure: SAVORY DILATION;  Surgeon: Daneil Dolin, MD;  [facility];  Service: Endoscopy;  Laterality: N/A;  7:30     reports that he has never smoked. He has never used smokeless tobacco. He reports that he does not drink alcohol or use drugs. Mobility: Ambulatory  No Known Allergies  Family History  Problem Relation Age of Onset  . Colon cancer Mother 70  . Coronary artery disease Father   . Colon cancer Sister 23  . Diabetes Sister   . Colon cancer Other        2 cousins, succumbed to illness     Prior to Admission medications   Medication Sig Start Date End  Date Taking? Authorizing Provider  atorvastatin (LIPITOR) 40 MG tablet Take 1 tablet (40 mg total) by mouth daily. Patient taking differently: Take 40 mg by mouth at bedtime.  02/15/17 06/13/17 Yes Bensimhon, Shaune Pascal, MD  Coenzyme Q10 (COQ10) 50 MG CAPS Take 1 capsule by mouth daily.   Yes [provider]  collagenase (SANTYL) ointment Apply topically daily. Patient taking differently: Apply topically every other day. At night 04/28/17  Yes Lavina Hamman, MD  ferrous sulfate 325 (65 FE) MG tablet Take 1 tablet (325 mg total) by mouth 3 (three) times daily with meals. 04/28/17  Yes Lavina Hamman, MD  insulin aspart (NOVOLOG FLEXPEN) 100 UNIT/ML FlexPen 5 units before each meal Patient taking differently: Inject 6 Units into the skin 3 (three) times daily with meals. 5 units before each meal 03/23/17   Yes Elayne Snare, MD  levothyroxine (SYNTHROID, LEVOTHROID) 25 MCG tablet Take 25 mcg by mouth daily before breakfast.   Yes [provider]  metolazone (ZAROXOLYN) 2.5 MG tablet take 1 tablet by mouth weekly if needed for SWELLING 04/19/17  Yes Branch, Alphonse Guild, MD  metoprolol succinate (TOPROL-XL) 25 MG 24 hr tablet take 1/2 tablet by mouth twice a day 01/03/17  Yes Bensimhon, Shaune Pascal, MD  pantoprazole (PROTONIX) 40 MG tablet Take 1 tablet (40 mg total) by mouth daily. 05/07/16  Yes Shirley Friar, PA-C  potassium chloride SA (K-DUR,KLOR-CON) 20 MEQ tablet Take 20 mEq by mouth daily.   Yes [provider]  PROAIR RESPICLICK 884 (90 BASE) MCG/ACT AEPB Inhale 1 puff into the lungs every 6 (six) hours as needed (for breathing).  08/15/15  Yes [provider]  torsemide (DEMADEX) 20 MG tablet TAKE 4 TABLETS IN THE MORNING AND 2 TABLET IN THE EVENING 06/10/17  Yes Branch, Alphonse Guild, MD  warfarin (COUMADIN) 2 MG tablet Take 1 tablet daily except 1/2 tablet on Tuesdays and Fridays Patient taking differently: Take 1 tablet daily except 1/2 tablet on Tuesdays and Thursdays 05/26/17  Yes Bensimhon, Shaune Pascal, MD  glucose blood test strip Use to test blood sugar 3 times daily 03/23/17   Elayne Snare, MD  Insulin Glargine (LANTUS SOLOSTAR) 100 UNIT/ML Solostar Pen Inject 15 Units into the skin daily at 10 pm. Patient not taking: Reported on 06/13/2017 04/13/17   Elayne Snare, MD  nitroGLYCERIN (NITROSTAT) 0.4 MG SL tablet Place 1 tablet (0.4 mg total) under the tongue every 5 (five) minutes as needed for chest pain. Patient not taking: Reported on 06/13/2017 06/10/17 09/08/17  Arnoldo Lenis, MD  polyethylene glycol [facility] / Floria Raveling) packet Take 17 g by mouth daily as needed for mild constipation. Patient not taking: Reported on 06/13/2017 04/28/17   Lavina Hamman, MD    Physical Exam:   97.8, 18, 74, 1:15/67, 98% on room air  Constitutional. Appears calm, comfortable.  Eyes.  Right pupil 2 mm, left pupil 3 mm, both round and reactive. Lids and irises appear unremarkable. There is subconjunctival hemorrhage inferior to the left iris.  ENT. Grossly normal hearing, lips and tongue.  Neck. No lymphadenopathy or masses. No thyromegaly.  Cardiovascular. Regular rate and rhythm. No murmur, rub or gallop. 3+ bilateral lower extremity edema all the way up to the abdomen.  Respiratory. Clear to auscultation bilaterally. No wheezes, rales or rhonchi. Normal respiratory effort.  Abdomen distended, soft, nontender. No hepatomegaly.  Skin. Scattered ecchymoses noted. No induration or nodules. Nontender. Right neck and chest rash of unclear etiology.  Psychiatric. Grossly  normal mood and affect. Speech fluent and appropriate.  Musculoskeletal. Grossly normal tone and strength all extremities. No abnormal movements.  Wt Readings from Last 3 Encounters:  06/13/17 88.7 kg (195 lb 9.6 oz)  05/26/17 82.1 kg (181 lb)  04/28/17 77.8 kg (171 lb 9.6 oz)    I have personally reviewed following labs and imaging studies  Labs:   Potassium 3.3, creatinine 2.88, BUN 68. Compared to 5/24 this year, creatinine has improved.  BNP 1151  Troponin 0.04  Platelet count 117, hemoglobin stable 10.4  INR 2.18  Urinalysis grossly negative.  Imaging studies:   Chest x-ray independently reviewed there is a moderate right pleural effusion. No pulmonary edema.  Medical tests:   EKG independently reviewed paced rhythm. No acute changes noted.  Test discussed with performing physician:    Decision to obtain old records:     Review and summation of old records:   Echocardiogram August 2017: LVEF 15-20 percent. Grade 3 diastolic dysfunction.  Active Problems:   Long term current use of anticoagulant therapy   Chronic kidney disease, stage IV (severe) (HCC)   DM type 2, uncontrolled, with renal complications (HCC)   Acute on chronic combined systolic and diastolic CHF  (congestive heart failure) (HCC)   Assessment/Plan Acute on chronic systolic, diastolic congestive heart failure with significant volume overload and right pleural effusion (asymptomatic). LVEF 15-20 percent by echocardiogram August 2017. Grade 3 diastolic dysfunction. -Aggressive IV diuresis, serial BMP -Troponin chronically elevated back to 2016. No history to suggest ACS.  Chronic kidney disease stage IV, appears to be a baseline. -Follow daily BMP while on aggressive IV diuresis.  Diabetes mellitus type 2 -Appears stable. Random blood sugar 194. Anion gap within normal limits. -Lantus, SSI  Paroxysmal atrial fibrillation -Stable. -Continue beta blocker, warfarin per pharmacy  Cirrhosis? Follow-up with GI as an outpatient.  Chronic thrombocytopenia. Possibly related to cirrhosis. Follow-up as an outpatient. Appears to be at baseline.  PMH: Coronary artery disease, s/p MI, s/p ICD implant in 2002 and a BiV ICD upgrade in 2009   Severity of Illness: The appropriate patient status for this patient is INPATIENT. Inpatient status is judged to be reasonable and necessary in order to provide the required intensity of service to ensure the patient's safety. The patient's presenting symptoms, physical exam findings, and initial radiographic and laboratory data in the context of their chronic comorbidities is felt to place them at high risk for further clinical deterioration. Furthermore, it is not anticipated that the patient will be medically stable for discharge from the hospital within 2 midnights of admission. The following factors support the patient status of inpatient.   " The patient's presenting symptoms include leg edema. " The worrisome physical exam findings include significant volume overload. " The initial radiographic and laboratory data are worrisome because of elevated BNP. " The chronic co-morbidities include chronic kidney disease stage IV, diabetes.   * I certify that  at the point of admission it is my clinical judgment that the patient will require inpatient hospital care spanning beyond 2 midnights from the point of admission due to high intensity of service, high risk for further deterioration and high frequency of surveillance required.*   DVT prophylaxis:warfarin Code Status: full Family Communication: none     Time spent: 41 minutes  Murray Hodgkins, MD  [facility] Direct contact: [phone] --Via [place]  --[email or web address]; password [email or web address]  7PM-7AM contact night coverage as above  06/13/2017, 6:02 PM

## 2017-06-14 DIAGNOSIS — D696 Thrombocytopenia, unspecified: Secondary | ICD-10-CM

## 2017-06-14 LAB — CUP PACEART INCLINIC DEVICE CHECK
Brady Statistic RA Percent Paced: 0 %
Brady Statistic RV Percent Paced: 98 %
HIGH POWER IMPEDANCE MEASURED VALUE: 37.3887
Implantable Lead Implant Date: 20091026
Implantable Lead Location: 753859
Implantable Lead Serial Number: 125581
Lead Channel Impedance Value: 512.5 Ohm
Lead Channel Pacing Threshold Amplitude: 0.75 V
Lead Channel Pacing Threshold Amplitude: 1 V
Lead Channel Pacing Threshold Pulse Width: 0.5 ms
Lead Channel Pacing Threshold Pulse Width: 0.8 ms
Lead Channel Sensing Intrinsic Amplitude: 1.7 mV
Lead Channel Setting Pacing Amplitude: 2 V
Lead Channel Setting Pacing Pulse Width: 0.8 ms
Lead Channel Setting Sensing Sensitivity: 2 mV
MDC IDC LEAD IMPLANT DT: 20020906
MDC IDC LEAD IMPLANT DT: 20091026
MDC IDC LEAD LOCATION: 753858
MDC IDC LEAD LOCATION: 753860
MDC IDC MSMT BATTERY REMAINING LONGEVITY: 54 mo
MDC IDC MSMT LEADCHNL LV PACING THRESHOLD AMPLITUDE: 0.75 V
MDC IDC MSMT LEADCHNL LV PACING THRESHOLD PULSEWIDTH: 0.5 ms
MDC IDC MSMT LEADCHNL RA IMPEDANCE VALUE: 387.5 Ohm
MDC IDC MSMT LEADCHNL RV IMPEDANCE VALUE: 575 Ohm
MDC IDC MSMT LEADCHNL RV PACING THRESHOLD AMPLITUDE: 1 V
MDC IDC MSMT LEADCHNL RV PACING THRESHOLD PULSEWIDTH: 0.8 ms
MDC IDC PG IMPLANT DT: 20160505
MDC IDC PG SERIAL: 7238042
MDC IDC SESS DTM: 20180621134439
MDC IDC SET LEADCHNL LV PACING PULSEWIDTH: 0.5 ms
MDC IDC SET LEADCHNL RV PACING AMPLITUDE: 2.5 V

## 2017-06-14 LAB — CBC
HEMATOCRIT: 30.6 % — AB (ref 39.0–52.0)
HEMOGLOBIN: 9.9 g/dL — AB (ref 13.0–17.0)
MCH: 31.9 pg (ref 26.0–34.0)
MCHC: 32.4 g/dL (ref 30.0–36.0)
MCV: 98.7 fL (ref 78.0–100.0)
Platelets: 115 10*3/uL — ABNORMAL LOW (ref 150–400)
RBC: 3.1 MIL/uL — AB (ref 4.22–5.81)
RDW: 16.9 % — ABNORMAL HIGH (ref 11.5–15.5)
WBC: 5.9 10*3/uL (ref 4.0–10.5)

## 2017-06-14 LAB — BASIC METABOLIC PANEL
Anion gap: 9 (ref 5–15)
BUN: 67 mg/dL — AB (ref 6–20)
CALCIUM: 9 mg/dL (ref 8.9–10.3)
CO2: 27 mmol/L (ref 22–32)
Chloride: 98 mmol/L — ABNORMAL LOW (ref 101–111)
Creatinine, Ser: 2.57 mg/dL — ABNORMAL HIGH (ref 0.61–1.24)
GFR calc Af Amer: 29 mL/min — ABNORMAL LOW (ref >60)
GFR calc non Af Amer: 25 mL/min — ABNORMAL LOW (ref >60)
GLUCOSE: 156 mg/dL — AB (ref 65–99)
Potassium: 2.9 mmol/L — ABNORMAL LOW (ref 3.5–5.1)
Sodium: 134 mmol/L — ABNORMAL LOW (ref 135–145)

## 2017-06-14 LAB — PROTIME-INR
INR: 2.19
PROTHROMBIN TIME: 24.7 s — AB (ref 11.4–15.2)

## 2017-06-14 LAB — GLUCOSE, CAPILLARY
GLUCOSE-CAPILLARY: 177 mg/dL — AB (ref 65–99)
Glucose-Capillary: 260 mg/dL — ABNORMAL HIGH (ref 65–99)
Glucose-Capillary: 146 mg/dL — ABNORMAL HIGH (ref 65–99)
Glucose-Capillary: 196 mg/dL — ABNORMAL HIGH (ref 65–99)
Glucose-Capillary: 171 mg/dL — ABNORMAL HIGH (ref 65–99)

## 2017-06-14 MED ORDER — WARFARIN SODIUM 2 MG PO TABS
2.0000 mg | ORAL_TABLET | Freq: Once | ORAL | Status: AC
  Administered 2017-06-14: 2 mg via ORAL
  Filled 2017-06-14: qty 1

## 2017-06-14 MED ORDER — FUROSEMIDE 10 MG/ML IJ SOLN
40.0000 mg | Freq: Two times a day (BID) | INTRAMUSCULAR | Status: DC
  Administered 2017-06-15 – 2017-06-16 (×4): 40 mg via INTRAVENOUS
  Filled 2017-06-14 (×4): qty 4

## 2017-06-14 MED ORDER — FUROSEMIDE 10 MG/ML IJ SOLN
60.0000 mg | Freq: Two times a day (BID) | INTRAMUSCULAR | Status: DC
  Administered 2017-06-14: 60 mg via INTRAVENOUS
  Filled 2017-06-14: qty 6

## 2017-06-14 MED ORDER — POTASSIUM CHLORIDE CRYS ER 20 MEQ PO TBCR
40.0000 meq | EXTENDED_RELEASE_TABLET | Freq: Every day | ORAL | Status: DC
  Administered 2017-06-14 – 2017-06-21 (×8): 40 meq via ORAL
  Filled 2017-06-14 (×9): qty 2

## 2017-06-14 NOTE — Consult Note (Signed)
[facility] CM Inpatient Consult   06/14/2017  [name] 1956-10-15 [id number]   Patient screened for potential [facility] Care Management services. Patient is on the [facility] registry as a benefit of his [employer] medicare. Went by patient's room to attempt to explain [facility] CM services and patient was meeting with someone from his medical team. Made inpatient case manager aware of patient's eligibility and requested if she felt patient could use [facility] services to please place a [facility] Care Management consult. For questions please contact:   Valiant Dills RN, BSN [facility] Liaison  [phone]) Business Mobile [phone]) Toll free office

## 2017-06-14 NOTE — Progress Notes (Signed)
ANTICOAGULATION CONSULT NOTE   Pharmacy Consult for Coumadin Indication : AFIB  No Known Allergies  Patient Measurements: Height: 5\' 10"  (177.8 cm) Weight: 193 lb 11.2 oz (87.9 kg) IBW/kg (Calculated) : 73 Heparin Dosing Weight:  Vital Signs: Temp: 97.9 F (36.6 C) (07/10 0451) Temp Source: Oral (07/10 0451) BP: 95/53 (07/10 0451) Pulse Rate: 70 (07/10 0451)  Labs:  Recent Labs  06/13/17 1205 06/14/17 0600  HGB 10.4* 9.9*  HCT 32.3* 30.6*  PLT 117* 115*  LABPROT 24.6* 24.7*  INR 2.18 2.19  CREATININE 2.88* 2.57*  TROPONINI 0.04*  --     Estimated Creatinine Clearance: 33.3 mL/min (A) (by C-G formula based on SCr of 2.57 mg/dL (H)).   Medical History: Past Medical History:  Diagnosis Date  . Adenocarcinoma of rectum ([place])    a. 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 AICD (automatic cardioverter/defibrillator) present 2002   BI V ICD  . Anemia   . CAD (coronary artery disease)    a. BMS to LAD 2001 at [facility] b. PTCA/atherectomy ramus and BMS to LAD 2009  . CHF (congestive heart failure) ([place])   . Cholelithiasis 06/2015  . Chronic kidney disease, stage IV (severe) ([place])   . Chronic systolic heart failure ([place])   . Colostomy in place [facility])   . Dizziness    a. chronic. Admission for this 07/18/2014  . DM type 2, uncontrolled, with renal complications ([place])   . Dysrhythmia   . Essential hypertension, benign   . GERD (gastroesophageal reflux disease)   . HCAP (healthcare-associated pneumonia) 07/21/2015  . Hematuria   . History of blood transfusion    "I've had 2 units so far this year" (09/27/2015)  . HLD (hyperlipidemia)   . Ischemic cardiomyopathy    EF 18% by nuclear study 2016, multiple myocardial infarctions in past    . Myocardial infarction ([place]) 2001  . Obesity   . Orthostatic hypotension   . OSA on CPAP   . Paroxysmal atrial fibrillation (HCC)    a. on amiodarone, digoxin and Eliquis  . PONV (postoperative nausea and vomiting)   . Prostate cancer ([place])    a. s/p  seed implants with chemo and radiation  . SAH (subarachnoid hemorrhage) ([place])    post-traumatic (fall) [facility] 12/2014  . TIA (transient ischemic attack)      Assessment: Continuation of Coumadin for AFIB PTA INR therapeutic.  No bleeding reported Goal of Therapy:  INR 2-3 Monitor platelets by anticoagulation protocol   Plan:  Coumadin 2 mg po today INR/PT daily Monitor for signs of bleeding, CBC  [name] 06/14/2017,11:48 AM

## 2017-06-14 NOTE — Progress Notes (Signed)
  PROGRESS NOTE  ZEKE AKER GYJ:[id number] DOB: [birth year] DOA: 06/13/2017 PCP: Redmond School, MD  Brief Narrative: 61 year old man PMH chronic systolic congestive heart failure, ischemic cardiomyopathy, coronary artery disease, atrial fibrillation/flutter presented to the emergency department with increasing weight gain and leg edema. Admitted for acute on chronic systolic congestive heart failure.  Assessment/Plan Acute on chronic systolic, diastolic congestive heart failure or significant volume overload, asymptomatic right pleural effusion. LVEF 15-20 percent by echocardiogram August 2017. Grade 3 diastolic dysfunction. -Modest improvement in volume overload. Pulmonary status stable. -Continue aggressive IV diuresis as blood pressure allows, serial BMP -Troponin elevated chronically to at least 2016, no ACS.  Chronic kidney disease stage IV, remains at baseline. -Daily BMP  Paroxysmal atrial fibrillation, stable -Continue warfarin  Cirrhosis, follow-up with GI as outpatient  Chronic thrombocytopenia, stable. Follow-up as an outpatient.  PMH: Coronary artery disease, s/p MI, s/p ICD implant in 2002 and a BiV ICD upgrade in 2009  DVT prophylaxis: warfarin Code Status: full Family Communication: none Disposition Plan: home    Murray Hodgkins, MD  [facility] Direct contact: [phone] --Via amion app [place]  --[email or web address]; password [email or web address]  7PM-7AM contact night coverage as above 06/14/2017, 6:12 PM  LOS: 1 day   Consultants:    Procedures:    Antimicrobials:    Interval history/Subjective: Feels a little better. Somewhat less edema.  Objective: Vitals: Afebrile, 97.9, 18, 70, 95/53, 97% on room air  Exam:     Constitutional: Appears calm, comfortable.  Eyes. Pupils, irises, was appear grossly normal.  ENT. Grossly normal lips and tongue. Hearing grossly normal.  Cardiovascular. Regular rate and rhythm. No murmur, rub or gallop. 3+ bilateral  lower extremity edema although after the abdomen. Modest improvement. Telemetry paced rhythm.  Respiratory. Clear to auscultation bilaterally. No wheezes, rales or rhonchi. Normal respiratory effort.  Abdomen soft, nontender.  Psychiatric. Grossly normal mood and affect. Speech fluent and appropriate.   I have personally reviewed the following:  Urine output 1100, -860 since admission Labs:  Blood sugars stable.  Potassium 2.9,  Creatinine stable, 67/2.57.  Hemoglobin stable, 9.9. Platelets stable, 1:15. WBC within normal limits.  INR 2.19  Imaging studies:    Medical tests:     Test discussed with performing physician:    Decision to obtain old records:    Review and summation of old records:    Scheduled Meds: . atorvastatin  40 mg Oral QHS  . [START ON 06/15/2017] furosemide  40 mg Intravenous BID  . insulin aspart  0-5 Units Subcutaneous QHS  . insulin aspart  0-9 Units Subcutaneous TID WC  . insulin aspart  5 Units Subcutaneous TID WC  . insulin glargine  15 Units Subcutaneous Q2200  . levothyroxine  25 mcg Oral QAC breakfast  . pantoprazole  40 mg Oral Daily  . potassium chloride SA  40 mEq Oral Daily  . sodium chloride flush  3 mL Intravenous Q12H  . Warfarin - Pharmacist Dosing Inpatient   Does not apply Q24H   Continuous Infusions: . sodium chloride      Principal Problem:   Acute on chronic combined systolic and diastolic CHF (congestive heart failure) (HCC) Active Problems:   Long term current use of anticoagulant therapy   Chronic kidney disease, stage IV (severe) (HCC)   DM type 2, uncontrolled, with renal complications (HCC)   Thrombocytopenia ([place])   LOS: 1 day

## 2017-06-14 NOTE — Plan of Care (Signed)
Problem: Food- and Nutrition-Related Knowledge Deficit (NB-1.1) Goal: Nutrition education Formal process to instruct or train a patient/client in a skill or to impart knowledge to help patients/clients voluntarily manage or modify food choices and eating behavior to maintain or improve health. Outcome: Adequate for Discharge Nutrition Education Note  RD consulted for nutrition education regarding chronic CHF.  RD provided "Heart Failure Nutrition Therapy" handout from the Academy of [employer].   Reviewed patient's dietary recall.   Breakfast: Frequently gets Mcmuffins from [employer]. Has homemade bacon/sausage that he states is salt free Lunch/dinner vary: Soups/sandwiches typically. Eats a lot of deer meat.  Other: Admits to consuming some very salty processed meats such as beanie weanies, vienna sausage, canned soups  Patient is an avid Scientist, clinical (histocompatibility and immunogenetics). He eats a lot of food that he catches. He says he will make homemade sausage, gravy and bacon without using salt at all. He seasons his meats with mostly garlic and onion powder. He says he doesn't add salt to anything.  Also applauded him for cooking his meats without salt and reportedly making homemade gravy, sausage and bacon without using any sodium.   Patient says that he reads labels and knows the diet fairly well, but has just not been very compliant. He has been eating items such as canned soup and processed meats even though he knows they are loaded in sodium.   He says he was told my a dietitian in the past that the egg mcmuffin were an OK option for him. I told him I was very skeptical of that information. I find it hard to believe that a dietitian had stated there was nothing wrong with this food, as these contain well over 500 mg of sodium each. Per review of [employer] website, this item has 750 mg sodium. He says he will start preparing his own breakfast. RD stated this was a good idea and commended him for deciding  to make this change on his own.   He sounds to have become very discontent not eating some of his favorite foods. We discussed having a good balance between quality of life and maintaining health. He should aim for 2 g of sodium per day, which is manageable.  Unknown predicted level of compliance. Patient is very tangential and kept getting distracted by telling his stories. Could not redirect. As such education session was limited, but from what RD heard, he sounds to do a fairly good job with the exception of his Mcmuffins, which he stated he would stay away from.   Body mass index is 27.79 kg/m. Pt meets criteria for overweight based on current BMI.  Current diet order is Heart healthy/carb modified, patient is consuming approximately 75-100% of meals at this time.   If additional nutrition issues arise, please re-consult RD.   Burtis Junes RD, LDN, CNSC Clinical Nutrition Pager: [phone] 06/14/2017 4:04 PM

## 2017-06-14 NOTE — Care Management Note (Signed)
Case Management Note  Patient Details  Name: [name] MRN: [id number] Date of Birth: [birth year]  Subjective/Objective:                  Pt admitted with CHF. He is from home. He lives with his wife. He is ind with ADL's. He has a colostomy he cares for himself. He has no HH or DME needs. He has PCP, drives himself to appointments. He has scale and weighs himself sometimes. He has insurance with drug coverage and no difficulty affording medications. He communicates no needs. Pt on [facility] registry.   Action/Plan: Plan for DC home with self care. CM will refer for Emmi transition calls at DC.   Expected Discharge Date:    06/17/2017              Expected Discharge Plan:  Home/Self Care  In-House Referral:  NA  Discharge planning Services  CM Consult  Post Acute Care Choice:  NA Choice offered to:  NA  Status of Service:  Completed, signed off Sherald Barge, RN 06/14/2017, 2:00 PM

## 2017-06-15 DIAGNOSIS — I5043 Acute on chronic combined systolic (congestive) and diastolic (congestive) heart failure: Secondary | ICD-10-CM

## 2017-06-15 LAB — BASIC METABOLIC PANEL
Anion gap: 11 (ref 5–15)
BUN: 63 mg/dL — AB (ref 6–20)
CALCIUM: 8.8 mg/dL — AB (ref 8.9–10.3)
CHLORIDE: 97 mmol/L — AB (ref 101–111)
CO2: 25 mmol/L (ref 22–32)
CREATININE: 2.68 mg/dL — AB (ref 0.61–1.24)
GFR, EST AFRICAN AMERICAN: 28 mL/min — AB (ref >60)
GFR, EST NON AFRICAN AMERICAN: 24 mL/min — AB (ref >60)
GLUCOSE: 126 mg/dL — AB (ref 65–99)
POTASSIUM: 3.2 mmol/L — AB (ref 3.5–5.1)
SODIUM: 133 mmol/L — AB (ref 135–145)

## 2017-06-15 LAB — GLUCOSE, CAPILLARY
GLUCOSE-CAPILLARY: 119 mg/dL — AB (ref 65–99)
GLUCOSE-CAPILLARY: 130 mg/dL — AB (ref 65–99)
Glucose-Capillary: 152 mg/dL — ABNORMAL HIGH (ref 65–99)
Glucose-Capillary: 153 mg/dL — ABNORMAL HIGH (ref 65–99)

## 2017-06-15 LAB — CBC
HEMATOCRIT: 30.2 % — AB (ref 39.0–52.0)
Hemoglobin: 9.9 g/dL — ABNORMAL LOW (ref 13.0–17.0)
MCH: 32.1 pg (ref 26.0–34.0)
MCHC: 32.8 g/dL (ref 30.0–36.0)
MCV: 98.1 fL (ref 78.0–100.0)
PLATELETS: 105 10*3/uL — AB (ref 150–400)
RBC: 3.08 MIL/uL — ABNORMAL LOW (ref 4.22–5.81)
RDW: 17 % — AB (ref 11.5–15.5)
WBC: 5.3 10*3/uL (ref 4.0–10.5)

## 2017-06-15 LAB — PROTIME-INR
INR: 2.55
Prothrombin Time: 27.9 seconds — ABNORMAL HIGH (ref 11.4–15.2)

## 2017-06-15 MED ORDER — WARFARIN SODIUM 1 MG PO TABS
1.0000 mg | ORAL_TABLET | Freq: Once | ORAL | Status: AC
  Administered 2017-06-15: 1 mg via ORAL
  Filled 2017-06-15: qty 1

## 2017-06-15 NOTE — Progress Notes (Signed)
PROGRESS NOTE    [name]  IWP:[id number] DOB: 10/21/1956 DOA: 06/13/2017 PCP: Redmond School, MD     Brief Narrative:  61 year old man admitted to the hospital on 7/9 from home due to lower extremity edema and weight gain, has a history of chronic systolic CHF, ischemic cardiomyopathy, coronary artery disease and A. fib/flutter. Was admitted for acute on chronic systolic congestive heart failure.   Assessment & Plan:   Principal Problem:   Acute on chronic combined systolic and diastolic CHF (congestive heart failure) (HCC) Active Problems:   Long term current use of anticoagulant therapy   Chronic kidney disease, stage IV (severe) (HCC)   DM type 2, uncontrolled, with renal complications (HCC)   Thrombocytopenia (HCC)   Acute on chronic combined CHF -Echo in August 2017 with ejection fraction of 15-20% and grade 3 diastolic dysfunction. -Remains volume overloaded on exam despite being 1.8 L negative since admission. -Continue Lasix at current dose.  Chronic kidney disease stage IV -Remains at baseline.  Paroxysmal atrial fibrillation -Rate controlled, anticoagulated on warfarin.   DVT prophylaxis: Warfarin Code Status: Full code Family Communication: Patient only Disposition Plan: Anticipate at least 48-72 more hours to allow for continued diuresis  Consultants:   None  Procedures:   None  Antimicrobials:  Anti-infectives    None       Subjective: Feels well, no issues, leg pain has improved, concerned that fluid is not coming off fast enough.  Objective: Vitals:   06/14/17 2100 06/15/17 0500 06/15/17 0854 06/15/17 1300  BP: (!) 105/48  (!) 102/58 (!) 108/53  Pulse: 74  68 76  Resp: 18  18 18   Temp:    97.9 F (36.6 C)  TempSrc:    Oral  SpO2: 99%  98% 100%  Weight:  87.8 kg (193 lb 9 oz)    Height:        Intake/Output Summary (Last 24 hours) at 06/15/17 1714 Last data filed at 06/15/17 1400  Gross per 24 hour  Intake              1120 ml  Output             1901 ml  Net             -781 ml   Filed Weights   06/13/17 1630 06/14/17 0451 06/15/17 0500  Weight: 88.7 kg (195 lb 9.6 oz) 87.9 kg (193 lb 11.2 oz) 87.8 kg (193 lb 9 oz)    Examination:  General exam: Alert, awake, oriented x 3 Respiratory system: Clear to auscultation. Respiratory effort normal. Cardiovascular system:RRR. No murmurs, rubs, gallops. Gastrointestinal system: Abdomen is nondistended, soft and nontender. No organomegaly or masses felt. Normal bowel sounds heard. Central nervous system: Alert and oriented. No focal neurological deficits. Extremities: 3-4+ pitting edema bilaterally all the way up to the hips Skin: No rashes, lesions or ulcers Psychiatry: Judgement and insight appear normal. Mood & affect appropriate.     Data Reviewed: I have personally reviewed following labs and imaging studies  CBC:  Recent Labs Lab 06/13/17 1205 06/14/17 0600 06/15/17 0720  WBC 5.6 5.9 5.3  NEUTROABS 4.2  --   --   HGB 10.4* 9.9* 9.9*  HCT 32.3* 30.6* 30.2*  MCV 99.7 98.7 98.1  PLT 117* 115* 505*   Basic Metabolic Panel:  Recent Labs Lab 06/13/17 1205 06/14/17 0600 06/15/17 0720  NA 133* 134* 133*  K 3.3* 2.9* 3.2*  CL 93* 98* 97*  CO2 28 27  25  GLUCOSE 194* 156* 126*  BUN 68* 67* 63*  CREATININE 2.88* 2.57* 2.68*  CALCIUM 9.1 9.0 8.8*   GFR: Estimated Creatinine Clearance: 31.9 mL/min (A) (by C-G formula based on SCr of 2.68 mg/dL (H)). Liver Function Tests: No results for input(s): AST, ALT, ALKPHOS, BILITOT, PROT, ALBUMIN in the last 168 hours. No results for input(s): LIPASE, AMYLASE in the last 168 hours. No results for input(s): AMMONIA in the last 168 hours. Coagulation Profile:  Recent Labs Lab 06/13/17 1205 06/14/17 0600 06/15/17 0720  INR 2.18 2.19 2.55   Cardiac Enzymes:  Recent Labs Lab 06/13/17 1205  TROPONINI 0.04*   BNP (last 3 results) No results for input(s): PROBNP in the last 8760  hours. HbA1C: No results for input(s): HGBA1C in the last 72 hours. CBG:  Recent Labs Lab 06/14/17 1655 06/14/17 2147 06/15/17 0743 06/15/17 1108 06/15/17 1611  GLUCAP 171* 177* 119* 152* 153*   Lipid Profile: No results for input(s): CHOL, HDL, LDLCALC, TRIG, CHOLHDL, LDLDIRECT in the last 72 hours. Thyroid Function Tests: No results for input(s): TSH, T4TOTAL, FREET4, T3FREE, THYROIDAB in the last 72 hours. Anemia Panel: No results for input(s): VITAMINB12, FOLATE, FERRITIN, TIBC, IRON, RETICCTPCT in the last 72 hours. Urine analysis:    Component Value Date/Time   COLORURINE YELLOW 06/13/2017 1201   APPEARANCEUR CLEAR 06/13/2017 1201   LABSPEC 1.008 06/13/2017 1201   PHURINE 6.0 06/13/2017 1201   GLUCOSEU NEGATIVE 06/13/2017 1201   HGBUR SMALL (A) 06/13/2017 1201   BILIRUBINUR NEGATIVE 06/13/2017 1201   KETONESUR NEGATIVE 06/13/2017 1201   PROTEINUR NEGATIVE 06/13/2017 1201   UROBILINOGEN 1.0 08/23/2015 0322   NITRITE NEGATIVE 06/13/2017 1201   LEUKOCYTESUR NEGATIVE 06/13/2017 1201   Sepsis Labs: @LABRCNTIP (procalcitonin:4,lacticidven:4)  )No results found for this or any previous visit (from the past 240 hour(s)).       Radiology Studies: No results found.      Scheduled Meds: . atorvastatin  40 mg Oral QHS  . furosemide  40 mg Intravenous BID  . insulin aspart  0-5 Units Subcutaneous QHS  . insulin aspart  0-9 Units Subcutaneous TID WC  . insulin aspart  5 Units Subcutaneous TID WC  . insulin glargine  15 Units Subcutaneous Q2200  . levothyroxine  25 mcg Oral QAC breakfast  . pantoprazole  40 mg Oral Daily  . potassium chloride SA  40 mEq Oral Daily  . sodium chloride flush  3 mL Intravenous Q12H  . Warfarin - Pharmacist Dosing Inpatient   Does not apply Q24H   Continuous Infusions: . sodium chloride       LOS: 2 days    Time spent: 30 minutes. Greater than 50% of this time was spent in direct contact with the patient coordinating  care.     Lelon Frohlich, MD [facility] Pager [phone]  If 7PM-7AM, please contact night-coverage [email or web address] Password [facility] 06/15/2017, 5:14 PM

## 2017-06-15 NOTE — Progress Notes (Signed)
ANTICOAGULATION CONSULT NOTE   Pharmacy Consult for Coumadin Indication : AFIB  No Known Allergies  Patient Measurements: Height: 5\' 10"  (177.8 cm) Weight: 193 lb 9 oz (87.8 kg) IBW/kg (Calculated) : 73 Heparin Dosing Weight:  Vital Signs: BP: 102/58 (07/11 0854) Pulse Rate: 68 (07/11 0854)  Labs:  Recent Labs  06/13/17 1205 06/14/17 0600 06/15/17 0720  HGB 10.4* 9.9* 9.9*  HCT 32.3* 30.6* 30.2*  PLT 117* 115* 105*  LABPROT 24.6* 24.7* 27.9*  INR 2.18 2.19 2.55  CREATININE 2.88* 2.57* 2.68*  TROPONINI 0.04*  --   --     Estimated Creatinine Clearance: 31.9 mL/min (A) (by C-G formula based on SCr of 2.68 mg/dL (H)).   Medical History: Past Medical History:  Diagnosis Date  . Adenocarcinoma of rectum ([place])    a. 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 AICD (automatic cardioverter/defibrillator) present 2002   BI V ICD  . Anemia   . CAD (coronary artery disease)    a. BMS to LAD 2001 at [facility] b. PTCA/atherectomy ramus and BMS to LAD 2009  . CHF (congestive heart failure) ([place])   . Cholelithiasis 06/2015  . Chronic kidney disease, stage IV (severe) ([place])   . Chronic systolic heart failure ([place])   . Colostomy in place [facility])   . Dizziness    a. chronic. Admission for this 07/18/2014  . DM type 2, uncontrolled, with renal complications ([place])   . Dysrhythmia   . Essential hypertension, benign   . GERD (gastroesophageal reflux disease)   . HCAP (healthcare-associated pneumonia) 07/21/2015  . Hematuria   . History of blood transfusion    "I've had 2 units so far this year" (09/27/2015)  . HLD (hyperlipidemia)   . Ischemic cardiomyopathy    EF 18% by nuclear study 2016, multiple myocardial infarctions in past    . Myocardial infarction ([place]) 2001  . Obesity   . Orthostatic hypotension   . OSA on CPAP   . Paroxysmal atrial fibrillation (HCC)    a. on amiodarone, digoxin and Eliquis  . PONV (postoperative nausea and vomiting)   . Prostate cancer ([place])    a. s/p seed implants with  chemo and radiation  . SAH (subarachnoid hemorrhage) ([place])    post-traumatic (fall) [facility] 12/2014  . TIA (transient ischemic attack)      Assessment: Continuation of Coumadin for AFIB PTA INR therapeutic.  No bleeding reported  Goal of Therapy:  INR 2-3  Plan:  Coumadin 1 mg po today INR/PT daily Monitor for signs of bleeding, CBC  Pricilla Larsson 06/15/2017,10:31 AM

## 2017-06-16 LAB — CBC
HEMATOCRIT: 31.9 % — AB (ref 39.0–52.0)
Hemoglobin: 10.4 g/dL — ABNORMAL LOW (ref 13.0–17.0)
MCH: 32.4 pg (ref 26.0–34.0)
MCHC: 32.6 g/dL (ref 30.0–36.0)
MCV: 99.4 fL (ref 78.0–100.0)
Platelets: 115 10*3/uL — ABNORMAL LOW (ref 150–400)
RBC: 3.21 MIL/uL — ABNORMAL LOW (ref 4.22–5.81)
RDW: 16.8 % — ABNORMAL HIGH (ref 11.5–15.5)
WBC: 6.3 10*3/uL (ref 4.0–10.5)

## 2017-06-16 LAB — BASIC METABOLIC PANEL
ANION GAP: 12 (ref 5–15)
BUN: 65 mg/dL — ABNORMAL HIGH (ref 6–20)
CHLORIDE: 97 mmol/L — AB (ref 101–111)
CO2: 24 mmol/L (ref 22–32)
Calcium: 8.8 mg/dL — ABNORMAL LOW (ref 8.9–10.3)
Creatinine, Ser: 2.84 mg/dL — ABNORMAL HIGH (ref 0.61–1.24)
GFR calc non Af Amer: 22 mL/min — ABNORMAL LOW (ref >60)
GFR, EST AFRICAN AMERICAN: 26 mL/min — AB (ref >60)
Glucose, Bld: 127 mg/dL — ABNORMAL HIGH (ref 65–99)
POTASSIUM: 3.9 mmol/L (ref 3.5–5.1)
SODIUM: 133 mmol/L — AB (ref 135–145)

## 2017-06-16 LAB — PROTIME-INR
INR: 2.14
PROTHROMBIN TIME: 24.3 s — AB (ref 11.4–15.2)

## 2017-06-16 LAB — GLUCOSE, CAPILLARY
GLUCOSE-CAPILLARY: 115 mg/dL — AB (ref 65–99)
GLUCOSE-CAPILLARY: 204 mg/dL — AB (ref 65–99)
GLUCOSE-CAPILLARY: 130 mg/dL — AB (ref 65–99)
Glucose-Capillary: 151 mg/dL — ABNORMAL HIGH (ref 65–99)

## 2017-06-16 MED ORDER — WARFARIN SODIUM 2 MG PO TABS
2.0000 mg | ORAL_TABLET | Freq: Once | ORAL | Status: AC
  Administered 2017-06-16: 2 mg via ORAL
  Filled 2017-06-16: qty 1

## 2017-06-16 MED ORDER — FUROSEMIDE 10 MG/ML IJ SOLN
60.0000 mg | Freq: Three times a day (TID) | INTRAMUSCULAR | Status: DC
  Administered 2017-06-16 (×2): 60 mg via INTRAVENOUS
  Filled 2017-06-16 (×2): qty 6

## 2017-06-16 NOTE — Consult Note (Signed)
Advanced Heart Failure Team Consult Note   Primary Physician: Dr Gerarda Fraction Primary Cardiologist:  Dr Harl Bowie  Reason for Consultation: Heart Failure   HPI:    [name] is seen today for evaluation of heart failure at the request of Dr Harl Bowie.   [name] is a 61 year old with a history of DMII, [place] 2016, rectal cancer, CKD Stage IV, chronic A fib, S/P AVN ablation, PAF, CAD BMS LAD 2011 BMS LAD 2009, OSA, chronic systolic heart failure, St Jude BiV ICD admitted to [facility] with increased lower extremity edema despite increasing home diuretic regimen to torsemide 80 mg in am and 40 mg in pm.    Admitted 5/23 through 04/28/17 with bleeding from stoma. Switched eliquis to coumadin. Also torsemide was cut back to 60 mg daily from torsemide 80 mg in am and 40 mg in pm. Discharge weight was 171 pounds.   Transferred to [facility] for HF management. He has been diuresing IV lasix 40 mg twice a day and yesterday lasix was increased and he received total of 200 mg lasix.   On admit creatinine 2.8, BNP 1151, troponin 0.04, and WBC 5.6. CXR on admit with small r pleural effusion.   He reports increased urine output. Denies SOB/CP. Complaining of leg edema.   ECHO 2017 EF 10015% Grade III DD  CAD--> s/p BMS LAD in 2001, PTCA ramus and BMS LAD in 2009.  Cardiolite (3/16) with EF 18%, no ischemia, prior anterior, apical and inferior infarction. NSTEMI in 5/16.  Myoview done 04/26/15 showed EF 20% with large region of prior infarct involving the apical anterior, anteroseptal, inferoseptal and inferior walls with extension to the true apex. Small region of reversible/inducible ischemia along the margin of the prior infarct at the mid ventricular anterior and anteroseptal walls. No change from previous.     Review of Systems: [y] = yes, [ ]  = no   General: Weight gain [Y ]; Weight loss [ ] ; Anorexia [ ] ; Fatigue [ Y]; Fever [ ] ; Chills [ ] ; Weakness [ ]   Cardiac: Chest pain/pressure [ ] ; Resting SOB [ ] ;  Exertional SOB [Y ]; Orthopnea [ ] ; Pedal Edema [Y ]; Palpitations [ ] ; Syncope [ ] ; Presyncope [ ] ; Paroxysmal nocturnal dyspnea[ ]   Pulmonary: Cough [ ] ; Wheezing[ ] ; Hemoptysis[ ] ; Sputum [ ] ; Snoring [ ]   GI: Vomiting[ ] ; Dysphagia[ ] ; Melena[ ] ; Hematochezia [ ] ; Heartburn[ ] ; Abdominal pain [ ] ; Constipation [ ] ; Diarrhea [ ] ; BRBPR [ ]   GU: Hematuria[ ] ; Dysuria [ ] ; Nocturia[ ]   Vascular: Pain in legs with walking [ ] ; Pain in feet with lying flat [ ] ; Non-healing sores [ ] ; Stroke [ ] ; TIA [ ] ; Slurred speech [ ] ;  Neuro: Headaches[ ] ; Vertigo[ ] ; Seizures[ ] ; Paresthesias[ ] ;Blurred vision [ ] ; Diplopia [ ] ; Vision changes [ ]   Ortho/Skin: Arthritis [ ] ; Joint pain [Y ]; Muscle pain [ ] ; Joint swelling [ ] ; Back Pain [ Y]; Rash [ ]   Psych: Depression[ ] ; Anxiety[ ]   Heme: Bleeding problems [ ] ; Clotting disorders [ ] ; Anemia [ ]   Endocrine: Diabetes [Y ]; Thyroid dysfunction[ ]   Home Medications Prior to Admission medications   Medication Sig Start Date End Date Taking? Authorizing Provider  atorvastatin (LIPITOR) 40 MG tablet Take 1 tablet (40 mg total) by mouth daily. Patient taking differently: Take 40 mg by mouth at bedtime.  02/15/17 06/13/17 Yes Claudius Mich, Shaune Pascal, MD  Coenzyme Q10 (COQ10) 50 MG CAPS Take  1 capsule by mouth daily.   Yes [provider]  collagenase (SANTYL) ointment Apply topically daily. Patient taking differently: Apply topically every other day. At night 04/28/17  Yes Lavina Hamman, MD  ferrous sulfate 325 (65 FE) MG tablet Take 1 tablet (325 mg total) by mouth 3 (three) times daily with meals. 04/28/17  Yes Lavina Hamman, MD  insulin aspart (NOVOLOG FLEXPEN) 100 UNIT/ML FlexPen 5 units before each meal Patient taking differently: Inject 6 Units into the skin 3 (three) times daily with meals. 5 units before each meal 03/23/17  Yes Elayne Snare, MD  levothyroxine (SYNTHROID, LEVOTHROID) 25 MCG tablet Take 25 mcg by mouth daily before breakfast.   Yes  [provider]  metolazone (ZAROXOLYN) 2.5 MG tablet take 1 tablet by mouth weekly if needed for SWELLING 04/19/17  Yes Branch, Alphonse Guild, MD  metoprolol succinate (TOPROL-XL) 25 MG 24 hr tablet take 1/2 tablet by mouth twice a day 01/03/17  Yes Furkan Keenum, Shaune Pascal, MD  pantoprazole (PROTONIX) 40 MG tablet Take 1 tablet (40 mg total) by mouth daily. 05/07/16  Yes Shirley Friar, PA-C  potassium chloride SA (K-DUR,KLOR-CON) 20 MEQ tablet Take 20 mEq by mouth daily.   Yes [provider]  PROAIR RESPICLICK 761 (90 BASE) MCG/ACT AEPB Inhale 1 puff into the lungs every 6 (six) hours as needed (for breathing).  08/15/15  Yes [provider]  torsemide (DEMADEX) 20 MG tablet TAKE 4 TABLETS IN THE MORNING AND 2 TABLET IN THE EVENING 06/10/17  Yes Branch, Alphonse Guild, MD  warfarin (COUMADIN) 2 MG tablet Take 1 tablet daily except 1/2 tablet on Tuesdays and Fridays Patient taking differently: Take 1 tablet daily except 1/2 tablet on Tuesdays and Thursdays 05/26/17  Yes Chantele Corado, Shaune Pascal, MD  glucose blood test strip Use to test blood sugar 3 times daily 03/23/17   Elayne Snare, MD  Insulin Glargine (LANTUS SOLOSTAR) 100 UNIT/ML Solostar Pen Inject 15 Units into the skin daily at 10 pm. Patient not taking: Reported on 06/13/2017 04/13/17   Elayne Snare, MD  nitroGLYCERIN (NITROSTAT) 0.4 MG SL tablet Place 1 tablet (0.4 mg total) under the tongue every 5 (five) minutes as needed for chest pain. Patient not taking: Reported on 06/13/2017 06/10/17 09/08/17  Arnoldo Lenis, MD  polyethylene glycol [facility] / Floria Raveling) packet Take 17 g by mouth daily as needed for mild constipation. Patient not taking: Reported on 06/13/2017 04/28/17   Lavina Hamman, MD    Past Medical History: Past Medical History:  Diagnosis Date  . Adenocarcinoma of rectum ([place])    a. 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 AICD (automatic cardioverter/defibrillator) present 2002   BI V ICD  . Anemia   . CAD (coronary artery disease)      a. BMS to LAD 2001 at [facility] b. PTCA/atherectomy ramus and BMS to LAD 2009  . CHF (congestive heart failure) ([place])   . Cholelithiasis 06/2015  . Chronic kidney disease, stage IV (severe) ([place])   . Chronic systolic heart failure ([place])   . Colostomy in place [facility])   . Dizziness    a. chronic. Admission for this 07/18/2014  . DM type 2, uncontrolled, with renal complications ([place])   . Dysrhythmia   . Essential hypertension, benign   . GERD (gastroesophageal reflux disease)   . HCAP (healthcare-associated pneumonia) 07/21/2015  . Hematuria   . History of blood transfusion    "I've had 2 units so far this year" (09/27/2015)  . HLD (hyperlipidemia)   .  Ischemic cardiomyopathy    EF 18% by nuclear study 2016, multiple myocardial infarctions in past    . Myocardial infarction ([place]) 2001  . Obesity   . Orthostatic hypotension   . OSA on CPAP   . Paroxysmal atrial fibrillation (HCC)    a. on amiodarone, digoxin and Eliquis  . PONV (postoperative nausea and vomiting)   . Prostate cancer ([place])    a. s/p seed implants with chemo and radiation  . SAH (subarachnoid hemorrhage) ([place])    post-traumatic (fall) [facility] 12/2014  . TIA (transient ischemic attack)     Past Surgical History: Past Surgical History:  Procedure Laterality Date  . Abdominal and Perineal Resection of Rectum with Total Mesorectal Excision  10/04/2007  . AV FISTULA PLACEMENT Right 09/16/2015   Procedure: ARTERIOVENOUS (AV) FISTULA CREATION - BRACHIOCEPHALIC;  Surgeon: Elam Dutch, MD;  Location: [place];  Service: Vascular;  Laterality: Right;  . BI-VENTRICULAR IMPLANTABLE CARDIOVERTER DEFIBRILLATOR  (CRT-D)  2009  . CARDIAC CATHETERIZATION  08/2001; 2009   ; [name] 07/10/2013  . CARDIAC DEFIBRILLATOR PLACEMENT  2002  . CARDIAC DEFIBRILLATOR PLACEMENT  2009   Upgraded to a BiV ICD  . COLONOSCOPY  09/14/2011   Dr. Gala Romney: via colostomy, Single pedunculated benign inflammatory polyp. Due for surveillance Oct 2015  .  COLONOSCOPY N/A 07/02/2014   Procedure: COLONOSCOPY;  Surgeon: Daneil Dolin, MD;  [facility];  Service: Endoscopy;  Laterality: N/A;  7:30 / COLONOSCOPY THRU COLOSTOMY  . COLONOSCOPY N/A 12/11/2014   Dr. Gala Romney via colostomy. Normal. Repeat in 2021.   Marland Kitchen COLONOSCOPY N/A 08/24/2015   Dr. Havery Moros: diminutive polyp not removed, stoma site of bleeding  . COLOSTOMY  2008  . CORONARY ANGIOPLASTY WITH STENT PLACEMENT  2001; ~ 2006   "1 + 1"   . ELECTROPHYSIOLOGIC STUDY N/A 08/28/2015   Procedure: AV Node Ablation;  Surgeon: Will Meredith Leeds, MD;  Location: [place] CV LAB;  Service: Cardiovascular;  Laterality: N/A;  . EP IMPLANTABLE DEVICE N/A 04/10/2015   Procedure: Ppm/Biv Ppm Generator Changeout;  Surgeon: Evans Lance, MD;  Location: [place] INVASIVE CV LAB CUPID;  Service: Cardiovascular;  Laterality: N/A;  . ERCP N/A 06/11/2016   Procedure: ENDOSCOPIC RETROGRADE CHOLANGIOPANCREATOGRAPHY (ERCP);  Surgeon: Teena Irani, MD;  Location: [facility] ENDOSCOPY;  Service: Endoscopy;  Laterality: N/A;  . ERCP N/A 01/26/2017   Procedure: ENDOSCOPIC RETROGRADE CHOLANGIOPANCREATOGRAPHY (ERCP);  Surgeon: Teena Irani, MD;  Location: [facility] ENDOSCOPY;  Service: Endoscopy;  Laterality: N/A;  . ERCP  06/2016   Dr. Amedeo Plenty: duodenal fistula, dilated bile duct but no obvious stones, s/p sphincterotomy and stent placement  . ERCP  01/2017   Dr. Amedeo Plenty: CBD stones, s/p sphincterotomy, balloon extraction, stent removal  . ESOPHAGOGASTRODUODENOSCOPY N/A 07/02/2014   Procedure: ESOPHAGOGASTRODUODENOSCOPY (EGD);  Surgeon: Daneil Dolin, MD;  [facility];  Service: Endoscopy;  Laterality: N/A;  7:30  . ESOPHAGOGASTRODUODENOSCOPY N/A 12/11/2014   JQB:HALPFX EGD  . ESOPHAGOGASTRODUODENOSCOPY (EGD) WITH PROPOFOL N/A 04/18/2017   Procedure: ESOPHAGOGASTRODUODENOSCOPY (EGD) WITH PROPOFOL;  Surgeon: Daneil Dolin, MD;  [facility];  Service: Endoscopy;  Laterality: N/A;  2:30pm  . GASTROINTESTINAL STENT  REMOVAL N/A 01/26/2017   Procedure: GASTROINTESTINAL STENT REMOVAL;  Surgeon: Teena Irani, MD;  Location: [place];  Service: Endoscopy;  Laterality: N/A;  . GIVENS CAPSULE STUDY N/A 07/23/2015   Dr. Michail Sermon: small non-bleeding AVM, mild gastritis  . LEFT HEART CATHETERIZATION WITH CORONARY ANGIOGRAM N/A 07/13/2013   Procedure: LEFT HEART CATHETERIZATION WITH  CORONARY ANGIOGRAM;  Surgeon: Lorretta Harp, MD;  Location: [facility] CATH LAB;  Service: Cardiovascular;  Laterality: N/A;  . Venia Minks DILATION N/A 07/02/2014   Procedure: Venia Minks DILATION;  Surgeon: Daneil Dolin, MD;  [facility];  Service: Endoscopy;  Laterality: N/A;  7:30  . MALONEY DILATION N/A 04/18/2017   Procedure: Venia Minks DILATION;  Surgeon: Daneil Dolin, MD;  [facility];  Service: Endoscopy;  Laterality: N/A;  . PORTACATH PLACEMENT  06/2007   "removed ~ 1 yr later"  . RIGHT HEART CATHETERIZATION N/A 02/24/2015   Procedure: RIGHT HEART CATH;  Surgeon: Jolaine Artist, MD;  Location: [facility] CATH LAB;  Service: Cardiovascular;  Laterality: N/A;  . SAVORY DILATION N/A 07/02/2014   Procedure: SAVORY DILATION;  Surgeon: Daneil Dolin, MD;  [facility];  Service: Endoscopy;  Laterality: N/A;  7:30    Family History: Family History  Problem Relation Age of Onset  . Colon cancer Mother 64  . Coronary artery disease Father   . Colon cancer Sister 63  . Diabetes Sister   . Colon cancer Other        2 cousins, succumbed to illness    Social History: Social History   Social History  . Marital status: Married    Spouse name: N/A  . Number of children: 1  . Years of education: N/A   Occupational History  .  Unemployed   Social History Main Topics  . Smoking status: Never Smoker  . Smokeless tobacco: Never Used  . Alcohol use No     Comment: Former user 45 years ago  . Drug use: No  . Sexual activity: No   Other Topics Concern  . None   Social History Narrative   Married 89 years    1 son, 2 grandkids    Denies alcohol, drugs, tobacco     Allergies:  No Known Allergies  Objective:    Vital Signs:   Temp:  [97.6 F (36.4 C)-98.7 F (37.1 C)] 97.6 F (36.4 C) (07/12 1249) Pulse Rate:  [69-73] 71 (07/12 1249) Resp:  [16-20] 16 (07/12 1249) BP: (104-105)/(57-63) 104/57 (07/12 1249) SpO2:  [94 %-100 %] 100 % (07/12 1249) Weight:  [191 lb 5.8 oz (86.8 kg)-195 lb 8.8 oz (88.7 kg)] 195 lb 8.8 oz (88.7 kg) (07/12 0903) Last BM Date: 06/16/17  Weight change: Filed Weights   06/15/17 0500 06/16/17 0459 06/16/17 0903  Weight: 193 lb 9 oz (87.8 kg) 191 lb 5.8 oz (86.8 kg) 195 lb 8.8 oz (88.7 kg)    Intake/Output:   Intake/Output Summary (Last 24 hours) at 06/16/17 1425 Last data filed at 06/16/17 1200  Gross per 24 hour  Intake              240 ml  Output             1180 ml  Net             -940 ml      Physical Exam    General:  Chronically ill. No resp difficulty. Sitting in the chair.  HEENT: normal Neck: supple. JVP to jaw . Carotids 2+ bilat; no bruits. No lymphadenopathy or thyromegaly appreciated. Cor: PMI nondisplaced. Regular rate & rhythm. No rubs, gallops or murmurs. Lungs: clear Abdomen: soft, nontender, + distended. No hepatosplenomegaly. No bruits or masses. Good bowel sounds.LLQ colostomy  Extremities: no cyanosis, clubbing, rash, R and LLE 2+ edema. RUE AVF + bruit  Neuro: alert &  orientedx3, cranial nerves grossly intact. moves all 4 extremities w/o difficulty. Affect pleasant   Telemetry    V paced 70s  Personally reviewed   EKG    A fib V paced 70 EKG 06/13/2017  Labs   Basic Metabolic Panel:  Recent Labs Lab 06/13/17 1205 06/14/17 0600 06/15/17 0720 06/16/17 0503  NA 133* 134* 133* 133*  K 3.3* 2.9* 3.2* 3.9  CL 93* 98* 97* 97*  CO2 28 27 25 24   GLUCOSE 194* 156* 126* 127*  BUN 68* 67* 63* 65*  CREATININE 2.88* 2.57* 2.68* 2.84*  CALCIUM 9.1 9.0 8.8* 8.8*    Liver Function Tests: No results for input(s): AST,  ALT, ALKPHOS, BILITOT, PROT, ALBUMIN in the last 168 hours. No results for input(s): LIPASE, AMYLASE in the last 168 hours. No results for input(s): AMMONIA in the last 168 hours.  CBC:  Recent Labs Lab 06/13/17 1205 06/14/17 0600 06/15/17 0720 06/16/17 0503  WBC 5.6 5.9 5.3 6.3  NEUTROABS 4.2  --   --   --   HGB 10.4* 9.9* 9.9* 10.4*  HCT 32.3* 30.6* 30.2* 31.9*  MCV 99.7 98.7 98.1 99.4  PLT 117* 115* 105* 115*    Cardiac Enzymes:  Recent Labs Lab 06/13/17 1205  TROPONINI 0.04*    BNP: BNP (last 3 results)  Recent Labs  06/13/17 1205  BNP 1,151.0*    ProBNP (last 3 results) No results for input(s): PROBNP in the last 8760 hours.   CBG:  Recent Labs Lab 06/15/17 1108 06/15/17 1611 06/15/17 2151 06/16/17 0745 06/16/17 1121  GLUCAP 152* 153* 130* 115* 204*    Coagulation Studies:  Recent Labs  06/14/17 0600 06/15/17 0720 06/16/17 0503  LABPROT 24.7* 27.9* 24.3*  INR 2.19 2.55 2.14     Imaging    No results found.   Medications:     Current Medications: . atorvastatin  40 mg Oral QHS  . furosemide  40 mg Intravenous BID  . furosemide  60 mg Intravenous TID  . insulin aspart  0-5 Units Subcutaneous QHS  . insulin aspart  0-9 Units Subcutaneous TID WC  . insulin aspart  5 Units Subcutaneous TID WC  . insulin glargine  15 Units Subcutaneous Q2200  . levothyroxine  25 mcg Oral QAC breakfast  . pantoprazole  40 mg Oral Daily  . potassium chloride SA  40 mEq Oral Daily  . sodium chloride flush  3 mL Intravenous Q12H  . warfarin  2 mg Oral Once  . Warfarin - Pharmacist Dosing Inpatient   Does not apply Q24H     Infusions: . sodium chloride         Patient Profile   [name] is 61 year old well known to the HF team. He has h/o chronic combined systolic/diastolic heart failure, CKD Stage IV, rectal ca with colostomy, DMII admitted with volume overload.    Assessment/Plan   1. A/C Combined Systolic/Diastolic Heart Failure-  ICM BiV ST Jude   ECHO 07/2016 EF 15-20% Grade III DD. Repeat ECHO .  Volume status elevated. Increase lasix to 120 IV bid.. Baseline weight 170s.  Stop Toprol.  Watch BP. He typically has soft bp.  (prior to admit toprol xl 12.5 mg twice a day)  No Arb,dig, spiro with CKD. Has not been on hydralazine/imdur with soft BP. BMET pending. Add ted hose.   2. Chronic A fib- Rate controlled. On coumadin. INR pending. Pharmacy to dose.   3. CKD Stage IV- creatinine baseline 2.8-3.0 Last  year he required short term dialysis.Has RUE AVF.  BMET pending.   4. DMII- on SSI + lantus.   5. CAD - BMS 2001 2009 LAD. On statin.   6. H/O Rectal CA with diverting colostomy     Length of Stay: 3  Amy Clegg, NP  06/16/2017, 2:25 PM  Advanced Heart Failure Team Pager [phone] (M-F; 7a - 4p)  Please contact [facility] for night-coverage after hours (4p -7a ) and weekends on [email or web address]  Patient seen and examined with Darrick Grinder, NP. We discussed all aspects of the encounter. I agree with the assessment and plan as stated above.   He is markedly volume overloaded (~20 pounds) in setting of severe ischemic CM and stage 4 CKD. Not responding well to IV lasix. Will start milrinone to facilitate diuresis and increase lasix to 120IV bid. Hold b-blocker. Watch renal function very closely. Continue coumadin for chronic AF.   Glori Bickers, MD  4:24 PM

## 2017-06-16 NOTE — Progress Notes (Signed)
Received ok from Fabian Sharp, cardiology, to give Lasix 40mg IV (due at 1600) + Lasix 60 mg IV (due at 1800).  Pt's creatinine 2.84.  Pt arrived at [facility] from [facility] at aprrx 1730.

## 2017-06-16 NOTE — Progress Notes (Signed)
Patient will be transferred to the cardiology/heart failure team at [place] [facility]. Please call us back with questions.  Domingo Mend, MD [facility] Pager: [phone]

## 2017-06-16 NOTE — Progress Notes (Signed)
ANTICOAGULATION CONSULT NOTE   Pharmacy Consult for Coumadin Indication : AFIB  No Known Allergies  Patient Measurements: Height: 5\' 10"  (177.8 cm) Weight: 191 lb 5.8 oz (86.8 kg) IBW/kg (Calculated) : 73  Vital Signs: Temp: 98.2 F (36.8 C) (07/12 0459) Temp Source: Oral (07/12 0459) BP: 105/61 (07/12 0459) Pulse Rate: 69 (07/12 0459)  Labs:  Recent Labs  06/13/17 1205 06/14/17 0600 06/15/17 0720 06/16/17 0503  HGB 10.4* 9.9* 9.9* 10.4*  HCT 32.3* 30.6* 30.2* 31.9*  PLT 117* 115* 105* 115*  LABPROT 24.6* 24.7* 27.9* 24.3*  INR 2.18 2.19 2.55 2.14  CREATININE 2.88* 2.57* 2.68* 2.84*  TROPONINI 0.04*  --   --   --    Estimated Creatinine Clearance: 27.8 mL/min (A) (by C-G formula based on SCr of 2.84 mg/dL (H)).  Medical History: Past Medical History:  Diagnosis Date  . Adenocarcinoma of rectum ([place])    a. 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 AICD (automatic cardioverter/defibrillator) present 2002   BI V ICD  . Anemia   . CAD (coronary artery disease)    a. BMS to LAD 2001 at [facility] b. PTCA/atherectomy ramus and BMS to LAD 2009  . CHF (congestive heart failure) ([place])   . Cholelithiasis 06/2015  . Chronic kidney disease, stage IV (severe) ([place])   . Chronic systolic heart failure ([place])   . Colostomy in place [facility])   . Dizziness    a. chronic. Admission for this 07/18/2014  . DM type 2, uncontrolled, with renal complications ([place])   . Dysrhythmia   . Essential hypertension, benign   . GERD (gastroesophageal reflux disease)   . HCAP (healthcare-associated pneumonia) 07/21/2015  . Hematuria   . History of blood transfusion    "I've had 2 units so far this year" (09/27/2015)  . HLD (hyperlipidemia)   . Ischemic cardiomyopathy    EF 18% by nuclear study 2016, multiple myocardial infarctions in past    . Myocardial infarction ([place]) 2001  . Obesity   . Orthostatic hypotension   . OSA on CPAP   . Paroxysmal atrial fibrillation (HCC)    a. on amiodarone, digoxin and Eliquis  .  PONV (postoperative nausea and vomiting)   . Prostate cancer ([place])    a. s/p seed implants with chemo and radiation  . SAH (subarachnoid hemorrhage) ([place])    post-traumatic (fall) [facility] 12/2014  . TIA (transient ischemic attack)    Assessment: Continuation of Coumadin for AFIB PTA INR therapeutic.  No bleeding reported  Goal of Therapy:  INR 2-3  Plan:  Coumadin 2 mg po today INR/PT daily Monitor for signs of bleeding, CBC  Davion Flannery A 06/16/2017,8:33 AM

## 2017-06-16 NOTE — Consult Note (Addendum)
Cardiology Consultation:   Patient ID: DIVONTE SENGER; [id number]; [birth year]   Admit date: 06/13/2017 Date of Consult: 06/16/2017  Primary Care Provider: Redmond School, MD Primary Cardiologist: Carlyle Dolly MD Primary Electrophysiologist:  Cristopher Peru. MD Advanced CHF: [place]    Patient Profile:   [name] is a 61 y.o. male with a hx ofIschemic cardiomyopathy, most recent echocardiogram dated 8 2017 revealed an LVEF of 15-20%, status post by the AICD placed by Dr. Caryl Comes with generator change on 04/10/2015, coronary artery disease with prior bare metal stent to the LAD in 2001 with repeat bare-metal stent to the LAD in 2009,  A. fib/flutter status post AV nodal ablation September 2016, on coumadin,, hyperlipidemia, bilateral carotid artery stenosis, chronic kidney disease, obstructive sleep apnea  who is being seen today for the evaluation ofCHF  at the request of Dr. Jerilee Hoh, Hospitalist service.  History of Present Illness:   Mr. Cardarelli was admitted on 06/13/2017 for worsening symptoms of lower extremity edema, increasing weight, with admission diagnosis of acute on chronic systolic heart failure. Patient apparently had been medically compliant, had been in touch with cardiology due to lower extremity edema and had been taking diuretic as directed. The patient has gained approximately 18 pounds from baseline.  He reports that he has been taking extra doses of lasix as directed by cardiologist, and even was told to go back on older regimen of diuretics. He was also taking metolazone as directed. He was advised to come to ER.   On arrival to the emergency room, patient's blood pressure was 101/72, heart rate 88, O2 sat 95%, he was afebrile. Pertinent labs revealed a sodium of 133, potassium 3.3, creatinine 2.88 (most recent creatinine dated 04/28/2017 3.03), BNP-1151. PT 24.6, INR 2.18. Chest x-ray revealed small right pleural effusion. EKG atrial fibrillation, with atrial  pacing.   He was treated with IV Lasix 40 mg 1, continued on IV Lasix 40 mg twice a day. Urine output since admission 2.6-1.Sine admission, he states that he has been gaining weight and not losing. He feels no better. He is requesting transfer to [facility] to be placed under the care of Dr. Haroldine Laws, his primary CHF physician.  He actually called the CHF clinic and spoke to nurse who he reports said that they would accept him at [facility].   Past Medical History:  Diagnosis Date  . Adenocarcinoma of rectum ([place])    a. 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 AICD (automatic cardioverter/defibrillator) present 2002   BI V ICD  . Anemia   . CAD (coronary artery disease)    a. BMS to LAD 2001 at [facility] b. PTCA/atherectomy ramus and BMS to LAD 2009  . CHF (congestive heart failure) ([place])   . Cholelithiasis 06/2015  . Chronic kidney disease, stage IV (severe) ([place])   . Chronic systolic heart failure ([place])   . Colostomy in place [facility])   . Dizziness    a. chronic. Admission for this 07/18/2014  . DM type 2, uncontrolled, with renal complications ([place])   . Dysrhythmia   . Essential hypertension, benign   . GERD (gastroesophageal reflux disease)   . HCAP (healthcare-associated pneumonia) 07/21/2015  . Hematuria   . History of blood transfusion    "I've had 2 units so far this year" (09/27/2015)  . HLD (hyperlipidemia)   . Ischemic cardiomyopathy    EF 18% by nuclear study 2016, multiple myocardial infarctions in past    . Myocardial infarction ([place]) 2001  . Obesity   . Orthostatic  hypotension   . OSA on CPAP   . Paroxysmal atrial fibrillation ([facility])    a. on amiodarone, digoxin and Eliquis  . PONV (postoperative nausea and vomiting)   . Prostate cancer ([place])    a. s/p seed implants with chemo and radiation  . SAH (subarachnoid hemorrhage) ([place])    post-traumatic (fall) [facility] 12/2014  . TIA (transient ischemic attack)     Past Surgical History:  Procedure Laterality Date  . Abdominal and Perineal Resection of Rectum  with Total Mesorectal Excision  10/04/2007  . AV FISTULA PLACEMENT Right 09/16/2015   Procedure: ARTERIOVENOUS (AV) FISTULA CREATION - BRACHIOCEPHALIC;  Surgeon: Elam Dutch, MD;  Location: [place];  Service: Vascular;  Laterality: Right;  . BI-VENTRICULAR IMPLANTABLE CARDIOVERTER DEFIBRILLATOR  (CRT-D)  2009  . CARDIAC CATHETERIZATION  08/2001; 2009   ; [name] 07/10/2013  . CARDIAC DEFIBRILLATOR PLACEMENT  2002  . CARDIAC DEFIBRILLATOR PLACEMENT  2009   Upgraded to a BiV ICD  . COLONOSCOPY  09/14/2011   Dr. Gala Romney: via colostomy, Single pedunculated benign inflammatory polyp. Due for surveillance Oct 2015  . COLONOSCOPY N/A 07/02/2014   Procedure: COLONOSCOPY;  Surgeon: Daneil Dolin, MD;  [facility];  Service: Endoscopy;  Laterality: N/A;  7:30 / COLONOSCOPY THRU COLOSTOMY  . COLONOSCOPY N/A 12/11/2014   Dr. Gala Romney via colostomy. Normal. Repeat in 2021.   Marland Kitchen COLONOSCOPY N/A 08/24/2015   Dr. Havery Moros: diminutive polyp not removed, stoma site of bleeding  . COLOSTOMY  2008  . CORONARY ANGIOPLASTY WITH STENT PLACEMENT  2001; ~ 2006   "1 + 1"   . ELECTROPHYSIOLOGIC STUDY N/A 08/28/2015   Procedure: AV Node Ablation;  Surgeon: Will Meredith Leeds, MD;  Location: [place] CV LAB;  Service: Cardiovascular;  Laterality: N/A;  . EP IMPLANTABLE DEVICE N/A 04/10/2015   Procedure: Ppm/Biv Ppm Generator Changeout;  Surgeon: Evans Lance, MD;  Location: [place] INVASIVE CV LAB CUPID;  Service: Cardiovascular;  Laterality: N/A;  . ERCP N/A 06/11/2016   Procedure: ENDOSCOPIC RETROGRADE CHOLANGIOPANCREATOGRAPHY (ERCP);  Surgeon: Teena Irani, MD;  Location: [facility] ENDOSCOPY;  Service: Endoscopy;  Laterality: N/A;  . ERCP N/A 01/26/2017   Procedure: ENDOSCOPIC RETROGRADE CHOLANGIOPANCREATOGRAPHY (ERCP);  Surgeon: Teena Irani, MD;  Location: [facility] ENDOSCOPY;  Service: Endoscopy;  Laterality: N/A;  . ERCP  06/2016   Dr. Amedeo Plenty: duodenal fistula, dilated bile duct but no obvious stones, s/p sphincterotomy and stent  placement  . ERCP  01/2017   Dr. Amedeo Plenty: CBD stones, s/p sphincterotomy, balloon extraction, stent removal  . ESOPHAGOGASTRODUODENOSCOPY N/A 07/02/2014   Procedure: ESOPHAGOGASTRODUODENOSCOPY (EGD);  Surgeon: Daneil Dolin, MD;  [facility];  Service: Endoscopy;  Laterality: N/A;  7:30  . ESOPHAGOGASTRODUODENOSCOPY N/A 12/11/2014   [id number] EGD  . ESOPHAGOGASTRODUODENOSCOPY (EGD) WITH PROPOFOL N/A 04/18/2017   Procedure: ESOPHAGOGASTRODUODENOSCOPY (EGD) WITH PROPOFOL;  Surgeon: Daneil Dolin, MD;  [facility];  Service: Endoscopy;  Laterality: N/A;  2:30pm  . GASTROINTESTINAL STENT REMOVAL N/A 01/26/2017   Procedure: GASTROINTESTINAL STENT REMOVAL;  Surgeon: Teena Irani, MD;  Location: [place];  Service: Endoscopy;  Laterality: N/A;  . GIVENS CAPSULE STUDY N/A 07/23/2015   Dr. Michail Sermon: small non-bleeding AVM, mild gastritis  . LEFT HEART CATHETERIZATION WITH CORONARY ANGIOGRAM N/A 07/13/2013   Procedure: LEFT HEART CATHETERIZATION WITH CORONARY ANGIOGRAM;  Surgeon: Lorretta Harp, MD;  Location: [facility] CATH LAB;  Service: Cardiovascular;  Laterality: N/A;  . MALONEY DILATION N/A 07/02/2014   Procedure: Venia Minks DILATION;  Surgeon: Cristopher Estimable  Rourk, MD;  [facility];  Service: Endoscopy;  Laterality: N/A;  7:30  . MALONEY DILATION N/A 04/18/2017   Procedure: Venia Minks DILATION;  Surgeon: Daneil Dolin, MD;  [facility];  Service: Endoscopy;  Laterality: N/A;  . PORTACATH PLACEMENT  06/2007   "removed ~ 1 yr later"  . RIGHT HEART CATHETERIZATION N/A 02/24/2015   Procedure: RIGHT HEART CATH;  Surgeon: Jolaine Artist, MD;  Location: [facility] CATH LAB;  Service: Cardiovascular;  Laterality: N/A;  . SAVORY DILATION N/A 07/02/2014   Procedure: SAVORY DILATION;  Surgeon: Daneil Dolin, MD;  [facility];  Service: Endoscopy;  Laterality: N/A;  7:30     Inpatient Medications: Scheduled Meds: . atorvastatin  40 mg Oral QHS  . furosemide  40 mg  Intravenous BID  . insulin aspart  0-5 Units Subcutaneous QHS  . insulin aspart  0-9 Units Subcutaneous TID WC  . insulin aspart  5 Units Subcutaneous TID WC  . insulin glargine  15 Units Subcutaneous Q2200  . levothyroxine  25 mcg Oral QAC breakfast  . pantoprazole  40 mg Oral Daily  . potassium chloride SA  40 mEq Oral Daily  . sodium chloride flush  3 mL Intravenous Q12H  . warfarin  2 mg Oral Once  . Warfarin - Pharmacist Dosing Inpatient   Does not apply Q24H   Continuous Infusions: . sodium chloride     PRN Meds: sodium chloride, sodium chloride flush  Allergies:   No Known Allergies  Social History:   Social History   Social History  . Marital status: Married    Spouse name: N/A  . Number of children: 1  . Years of education: N/A   Occupational History  .  Unemployed   Social History Main Topics  . Smoking status: Never Smoker  . Smokeless tobacco: Never Used  . Alcohol use No     Comment: Former user 45 years ago  . Drug use: No  . Sexual activity: No   Other Topics Concern  . Not on file   Social History Narrative   Married 51 years   1 son, 2 grandkids    Denies alcohol, drugs, tobacco     Family History:   The patient's family history includes Colon cancer in his other; Colon cancer (age of onset: 33) in his sister; Colon cancer (age of onset: 58) in his mother; Coronary artery disease in his father; Diabetes in his sister.  ROS:  Please see the history of present illness.  ROS  All other ROS reviewed and negative.     Physical Exam/Data:   Vitals:   06/15/17 1958 06/15/17 2303 06/16/17 0459 06/16/17 0903  BP:  105/63 105/61   Pulse:  73 69   Resp:  20 18   Temp:  98.7 F (37.1 C) 98.2 F (36.8 C)   TempSrc:  Oral Oral   SpO2: 94% 100% 95%   Weight:   191 lb 5.8 oz (86.8 kg) 195 lb 8.8 oz (88.7 kg)  Height:        Intake/Output Summary (Last 24 hours) at 06/16/17 1019 Last data filed at 06/16/17 1012  Gross per 24 hour  Intake               480 ml  Output             1281 ml  Net             -801 ml  Filed Weights   06/15/17 0500 06/16/17 0459 06/16/17 0903  Weight: 193 lb 9 oz (87.8 kg) 191 lb 5.8 oz (86.8 kg) 195 lb 8.8 oz (88.7 kg)   Body mass index is 28.06 kg/m.  General:  Well nourished, well developed, in no acute distress HEENT: normal Lymph: no adenopathy Neck: no JVD Endocrine:  No thryomegaly Vascular: No carotid bruits; FA pulses 2+ bilaterally without bruits  Cardiac:  normal S1, S2; distant heart sounds, RRR;  Lungs:  Left sided crackles are noted, without wheezes, diminished on the right.  Abd: soft, mild distention,  no hepatomegaly Colostomy is noted.  Ext: AV fistula noted on the right upper arm. Woody bilateral edema, with multiple adhesive bandages noted.  Musculoskeletal:  No deformities, BUE and BLE strength normal and equal Skin: warm and dry  Neuro:  CNs 2-12 intact, no focal abnormalities noted Psych:  Normal affect   EKG:  The EKG was personally reviewed and demonstrates:  Atrial fib with V-Pacing.  Telemetry:  Telemetry was personally reviewed and demonstrates: Afib . AV pacing, with NSVT.    Relevant CV Studies: Echocardiogram 07/30/2017 Left ventricle: The cavity size was moderately dilated. There was   mild focal basal hypertrophy of the septum. Systolic function was   severely reduced. The estimated ejection fraction was in the   range of 15% to 20%. There is akinesis of the anterolateral and   apical myocardium. Doppler parameters are consistent with a   reversible restrictive pattern, indicative of decreased left   ventricular diastolic compliance and/or increased left atrial   pressure (grade 3 diastolic dysfunction). - Aortic valve: There was mild regurgitation. - Aorta: Ascending aortic diameter: 41 mm (S). - Ascending aorta: The ascending aorta was mildly dilated. - Left atrium: The atrium was moderately dilated. - Right ventricle: The cavity size was moderately  dilated. Wall   thickness was normal. Pacer wire or catheter noted in right   ventricle. - Right atrium: Pacer wire or catheter noted in right atrium.    Cardiac Catheterization 07/13/2013 ANGIOGRAPHIC RESULTS:   1. Left main; normal  2. LAD; the entire proximal third of the LAD was fluoroscopically calcified. The proximal third had approximately 50-60% segmental stenosis. The stent was widely patent in the proximal third of the LAD with 30-40% in-stent restenosis". There was a moderate size first diagonal branch and the ramus distribution that was occluded in its proximal portion and filled by collaterals. This was noted to be highly diseased at his last cath in 2009. There was 50-60% segmental stenosis in the middle third and 70% in the distal/apical third. There were 2 small marginal branches arising from the middle third that had 90% ostial stenoses unchanged from prior cath 3. Left circumflex; dominant with 99% long segmental proximal OM1 stenosis in a small to medium-size vessel unchanged from prior cath 4. Right coronary artery; nondominant with 50% mid and 80% distal stenosis unchanged from prior cath  5. Left ventriculography; not performed today to conserve contrast   Laboratory Data:  Chemistry Recent Labs Lab 06/14/17 0600 06/15/17 0720 06/16/17 0503  NA 134* 133* 133*  K 2.9* 3.2* 3.9  CL 98* 97* 97*  CO2 27 25 24   GLUCOSE 156* 126* 127*  BUN 67* 63* 65*  CREATININE 2.57* 2.68* 2.84*  CALCIUM 9.0 8.8* 8.8*  GFRNONAA 25* 24* 22*  GFRAA 29* 28* 26*  ANIONGAP 9 11 12    Hematology Recent Labs Lab 06/14/17 0600 06/15/17 0720 06/16/17 0503  WBC 5.9 5.3 6.3  RBC 3.10* 3.08* 3.21*  HGB 9.9* 9.9* 10.4*  HCT 30.6* 30.2* 31.9*  MCV 98.7 98.1 99.4  MCH 31.9 32.1 32.4  MCHC 32.4 32.8 32.6  RDW 16.9* 17.0* 16.8*  PLT 115* 105* 115*   Cardiac Enzymes Recent Labs Lab 06/13/17 1205  TROPONINI 0.04*   No results for input(s): TROPIPOC in the last 168 hours.   BNP Recent Labs Lab 06/13/17 1205  BNP 1,151.0*    Radiology/Studies:  Dg Chest 2 View  Result Date: 06/13/2017 CLINICAL DATA:  Fluid retention, no improvement with medication EXAM: CHEST  2 VIEW COMPARISON:  06/09/2016 FINDINGS: There is a small right pleural effusion with right basilar atelectasis. There is mild bilateral interstitial thickening. There is no left pleural effusion. There is no pneumothorax. There is mild stable cardiomegaly. There is a 3 lead cardiac pacemaker. The osseous structures are unremarkable. IMPRESSION: 1. Small right pleural effusion. Electronically Signed   By: Kathreen Devoid   On: 06/13/2017 13:30    Assessment and Plan:   1. Acute on Chronic Systolic CHF in the setting of HFrEF: Most recent echocardiogram dated 07/22/2016 demonstrated EF of 15-20%. He failed OP treatment with increased doses of diuretics, with worsening symptoms of edema and PND. He has been treated with IV lasix 40 mg BID, with weight gain and no improvement in symptoms.   The patient is requesting transfer to [facility] to be managed by the Advanced CHF team, specifically Dr. Haroldine Laws, who is is primary cardiologist. I have discussed this with Dr. Harl Bowie who is going to see patient first. I have informed patient, that if he does indeed get transferred, we will have to make sure a bed in available for him before he is physically moved. He verbalizes understanding.    2. CKD Stage IV: Has dialysis shunt to his right forearm. This has been accessed in the past. Will wait to consider placement of PICC line.   3. CAD: BMS to the LAD in 2001 with redo in 2009. Continue secondary prevention.   4. Atrial fib/flutter: Continues on coumadin therapy while hospitalized. Rate is controlled without AV nodal blocking agents.   5. BiV AICD situ:  St. Jude generator placed on 04/16/2015.      Signed, Jory Sims DNP, ANP. AACC  06/16/2017 10:19 AM   Patient seen and discussed with DNP Purcell Nails, I agree  with her documentation. 61 yo male history of CAD/ICM with BiV AICD, LVEF 15-20% by echo 07/2016, history of VF arrest on amiodarone, afib/aflutter, HL, carotid stenosis CKD, OSA admitted 06/13/17 with fluid overload and SOB, up 10 lbs by his home scales. Weight 04/2017 clinic 182 lbs, home weights up to 192 lbs.  In general his medical therapy has been limited by low bp's and orthostatic symptoms.    He was started on IV lasix this admission 40mg  bid, negative 2.6 liters since admission. Renal function some variation but within his recent Cr range of 2.5-3.0. Of note appears his home torsemide was decreased in May from 80mg  in AM and 40mg  in PM to 60mg  daily dur to worsening renal function that admission.   BNP 1151 trop 0.04 Hgb 10.4 Plt 117 INR 2.18 K 3.3 Cr 2.88  CXR small right pleural effusion EKG V-paced  Patient admitted with acute on chronic systolic heart failure, at least 10 lbs weight gain. Net negative 2.6 liters since admission, I suspect his weights are inaccurate as they suggest he has gained an additional 4 lbs. Reasonable response to fairly low dose  diuretics, we will increase to lasix 60mg  IV tid. Renal function is decreased but within prior ranges. Due to patient complex CHF history and patient preference we will arrange transfer to [facility] to the CHF service who are familiar with the patient for further management. At this time would continue IV diuretics, do not see indication for inotropes or HD at currently, thus we will place on tele floor   Carlyle Dolly MD

## 2017-06-17 LAB — GLUCOSE, CAPILLARY
GLUCOSE-CAPILLARY: 99 mg/dL (ref 65–99)
GLUCOSE-CAPILLARY: 118 mg/dL — AB (ref 65–99)
Glucose-Capillary: 122 mg/dL — ABNORMAL HIGH (ref 65–99)
Glucose-Capillary: 189 mg/dL — ABNORMAL HIGH (ref 65–99)

## 2017-06-17 LAB — BASIC METABOLIC PANEL
ANION GAP: 12 (ref 5–15)
BUN: 60 mg/dL — ABNORMAL HIGH (ref 6–20)
CO2: 22 mmol/L (ref 22–32)
Calcium: 9 mg/dL (ref 8.9–10.3)
Chloride: 97 mmol/L — ABNORMAL LOW (ref 101–111)
Creatinine, Ser: 2.86 mg/dL — ABNORMAL HIGH (ref 0.61–1.24)
GFR calc Af Amer: 26 mL/min — ABNORMAL LOW (ref >60)
GFR calc non Af Amer: 22 mL/min — ABNORMAL LOW (ref >60)
GLUCOSE: 112 mg/dL — AB (ref 65–99)
POTASSIUM: 3.7 mmol/L (ref 3.5–5.1)
Sodium: 131 mmol/L — ABNORMAL LOW (ref 135–145)

## 2017-06-17 LAB — CBC
HEMATOCRIT: 35.7 % — AB (ref 39.0–52.0)
HEMOGLOBIN: 11.2 g/dL — AB (ref 13.0–17.0)
MCH: 31.3 pg (ref 26.0–34.0)
MCHC: 31.4 g/dL (ref 30.0–36.0)
MCV: 99.7 fL (ref 78.0–100.0)
Platelets: 112 10*3/uL — ABNORMAL LOW (ref 150–400)
RBC: 3.58 MIL/uL — ABNORMAL LOW (ref 4.22–5.81)
RDW: 16.7 % — ABNORMAL HIGH (ref 11.5–15.5)
WBC: 7.1 10*3/uL (ref 4.0–10.5)

## 2017-06-17 LAB — PROTIME-INR
INR: 1.92
PROTHROMBIN TIME: 22.2 s — AB (ref 11.4–15.2)

## 2017-06-17 MED ORDER — FUROSEMIDE 10 MG/ML IJ SOLN
80.0000 mg | Freq: Three times a day (TID) | INTRAMUSCULAR | Status: DC
  Administered 2017-06-17: 80 mg via INTRAVENOUS
  Filled 2017-06-17: qty 8

## 2017-06-17 MED ORDER — FUROSEMIDE 10 MG/ML IJ SOLN
120.0000 mg | Freq: Two times a day (BID) | INTRAVENOUS | Status: DC
  Administered 2017-06-17 – 2017-06-19 (×4): 120 mg via INTRAVENOUS
  Filled 2017-06-17 (×3): qty 12
  Filled 2017-06-17: qty 10
  Filled 2017-06-17: qty 12
  Filled 2017-06-17: qty 10

## 2017-06-17 MED ORDER — METOPROLOL SUCCINATE ER 25 MG PO TB24
12.5000 mg | ORAL_TABLET | Freq: Every day | ORAL | Status: DC
  Administered 2017-06-17: 12.5 mg via ORAL
  Filled 2017-06-17: qty 1

## 2017-06-17 MED ORDER — MILRINONE LACTATE IN DEXTROSE 20-5 MG/100ML-% IV SOLN
0.1250 ug/kg/min | INTRAVENOUS | Status: DC
  Administered 2017-06-17 – 2017-06-21 (×4): 0.125 ug/kg/min via INTRAVENOUS
  Filled 2017-06-17 (×4): qty 100

## 2017-06-17 MED ORDER — METOLAZONE 5 MG PO TABS
5.0000 mg | ORAL_TABLET | Freq: Once | ORAL | Status: AC
Start: 2017-06-17 — End: 2017-06-17
  Administered 2017-06-17: 5 mg via ORAL
  Filled 2017-06-17: qty 1

## 2017-06-17 MED ORDER — WARFARIN SODIUM 2 MG PO TABS
2.0000 mg | ORAL_TABLET | Freq: Once | ORAL | Status: AC
  Administered 2017-06-17: 2 mg via ORAL
  Filled 2017-06-17: qty 1

## 2017-06-17 NOTE — Progress Notes (Signed)
ANTICOAGULATION CONSULT NOTE - Follow Up Consult  Pharmacy Consult for Coumadin Indication: atrial fibrillation  No Known Allergies  Patient Measurements: Height: 5\' 10"  (177.8 cm) Weight: 194 lb 4.8 oz (88.1 kg) IBW/kg (Calculated) : 73  Vital Signs: Temp: 97.5 F (36.4 C) (07/13 0521) Temp Source: Oral (07/13 0521) BP: 103/69 (07/13 0521) Pulse Rate: 76 (07/13 0521)  Labs:  Recent Labs  06/15/17 0720 06/16/17 0503 06/17/17 0936  HGB 9.9* 10.4* 11.2*  HCT 30.2* 31.9* 35.7*  PLT 105* 115* 112*  LABPROT 27.9* 24.3* 22.2*  INR 2.55 2.14 1.92  CREATININE 2.68* 2.84* 2.86*    Estimated Creatinine Clearance: 29.9 mL/min (A) (by C-G formula based on SCr of 2.86 mg/dL (H)).  Assessment: 60yom continues on coumadin for afib. INR slightly below goal at 1.92. CBC stable. No bleeding.  Home dose: 2mg  daily except 1mg  on Tuesday/Thursday  Goal of Therapy:  INR 2-3 Monitor platelets by anticoagulation protocol: Yes   Plan:  1) Coumadin 2mg  tonight 2) Daily INR  Deboraha Sprang 06/17/2017,11:13 AM

## 2017-06-17 NOTE — Progress Notes (Signed)
Tech offered Pt a bath. Pt stated he is waiting on his wife to arrive to hospital, she is bringing him some clean under garments. Tech asked Pt to notify her when wife arrives and if he needed any assistance to let tech know. Pt acknowledged.

## 2017-06-18 DIAGNOSIS — E876 Hypokalemia: Secondary | ICD-10-CM

## 2017-06-18 DIAGNOSIS — N179 Acute kidney failure, unspecified: Secondary | ICD-10-CM

## 2017-06-18 LAB — BASIC METABOLIC PANEL
Anion gap: 12 (ref 5–15)
Anion gap: 9 (ref 5–15)
BUN: 59 mg/dL — AB (ref 6–20)
BUN: 56 mg/dL — AB (ref 6–20)
CALCIUM: 9 mg/dL (ref 8.9–10.3)
CHLORIDE: 97 mmol/L — AB (ref 101–111)
CO2: 25 mmol/L (ref 22–32)
CO2: 26 mmol/L (ref 22–32)
CREATININE: 2.64 mg/dL — AB (ref 0.61–1.24)
Calcium: 9.1 mg/dL (ref 8.9–10.3)
Chloride: 100 mmol/L — ABNORMAL LOW (ref 101–111)
Creatinine, Ser: 2.65 mg/dL — ABNORMAL HIGH (ref 0.61–1.24)
GFR calc Af Amer: 28 mL/min — ABNORMAL LOW (ref >60)
GFR calc Af Amer: 28 mL/min — ABNORMAL LOW (ref >60)
GFR calc non Af Amer: 24 mL/min — ABNORMAL LOW (ref >60)
GFR, EST NON AFRICAN AMERICAN: 24 mL/min — AB (ref >60)
Glucose, Bld: 162 mg/dL — ABNORMAL HIGH (ref 65–99)
Glucose, Bld: 164 mg/dL — ABNORMAL HIGH (ref 65–99)
POTASSIUM: 3.7 mmol/L (ref 3.5–5.1)
Potassium: 3.5 mmol/L (ref 3.5–5.1)
SODIUM: 135 mmol/L (ref 135–145)
Sodium: 134 mmol/L — ABNORMAL LOW (ref 135–145)

## 2017-06-18 LAB — CBC
HCT: 32.4 % — ABNORMAL LOW (ref 39.0–52.0)
Hemoglobin: 10.3 g/dL — ABNORMAL LOW (ref 13.0–17.0)
MCH: 31.1 pg (ref 26.0–34.0)
MCHC: 31.8 g/dL (ref 30.0–36.0)
MCV: 97.9 fL (ref 78.0–100.0)
PLATELETS: 131 10*3/uL — AB (ref 150–400)
RBC: 3.31 MIL/uL — AB (ref 4.22–5.81)
RDW: 16.2 % — AB (ref 11.5–15.5)
WBC: 5.9 10*3/uL (ref 4.0–10.5)

## 2017-06-18 LAB — GLUCOSE, CAPILLARY
GLUCOSE-CAPILLARY: 237 mg/dL — AB (ref 65–99)
GLUCOSE-CAPILLARY: 142 mg/dL — AB (ref 65–99)
GLUCOSE-CAPILLARY: 183 mg/dL — AB (ref 65–99)
Glucose-Capillary: 162 mg/dL — ABNORMAL HIGH (ref 65–99)

## 2017-06-18 LAB — PROTIME-INR
INR: 2.01
PROTHROMBIN TIME: 23 s — AB (ref 11.4–15.2)

## 2017-06-18 LAB — MAGNESIUM: Magnesium: 2.3 mg/dL (ref 1.7–2.4)

## 2017-06-18 MED ORDER — POTASSIUM CHLORIDE CRYS ER 20 MEQ PO TBCR
40.0000 meq | EXTENDED_RELEASE_TABLET | Freq: Once | ORAL | Status: AC
  Administered 2017-06-18: 40 meq via ORAL
  Filled 2017-06-18: qty 2

## 2017-06-18 MED ORDER — WARFARIN SODIUM 2 MG PO TABS
2.0000 mg | ORAL_TABLET | Freq: Once | ORAL | Status: AC
  Administered 2017-06-18: 2 mg via ORAL
  Filled 2017-06-18: qty 1

## 2017-06-18 NOTE — Progress Notes (Addendum)
Pt had a 11-beat run of VTach.  Notified Dr. Radford Pax as she is the in-house cards contact at this time.  Pt is asymptomatic.

## 2017-06-18 NOTE — Progress Notes (Signed)
Pt is up to use the urinal, VT 12 beat run V-Paced Pt is alert and oriented with no complaints of dizziness vitals stable.

## 2017-06-18 NOTE — Progress Notes (Signed)
Advanced Heart Failure Rounding Note    Subjective:    Started on milrinone yesterday and lasix increased.  Continues with exertional dyspnea and bloating but improved.   Weight down 5 pounds. Renal function improving.     Objective:   Weight Range: 189 lb 4.8 oz (85.9 kg) Body mass index is 27.16 kg/m.   Vital Signs:   Temp:  [97.4 F (36.3 C)-98.2 F (36.8 C)] 98.2 F (36.8 C) (07/14 0436) Pulse Rate:  [70-75] 75 (07/14 0436) Resp:  [18-20] 20 (07/14 0436) BP: (101-114)/(50-58) 101/50 (07/14 0436) SpO2:  [97 %-100 %] 97 % (07/14 0436) Weight:  [189 lb 4.8 oz (85.9 kg)] 189 lb 4.8 oz (85.9 kg) (07/14 0436) Last BM Date: 06/16/17  Weight change: Filed Weights   06/16/17 1827 06/17/17 0521 06/18/17 0436  Weight: 196 lb 6.4 oz (89.1 kg) 194 lb 4.8 oz (88.1 kg) 189 lb 4.8 oz (85.9 kg)    Intake/Output:   Intake/Output Summary (Last 24 hours) at 06/18/17 1134 Last data filed at 06/18/17 0858  Gross per 24 hour  Intake           638.81 ml  Output             4100 ml  Net         -3461.19 ml      Physical Exam    General:  Sitting in chair. No NAD HEENT: Normal Neck: Supple. JVP to jaw. Carotids 2+ bilat; no bruits. No lymphadenopathy or thyromegaly appreciated. Cor: PMI laterally displaced. IRR 2/6 MR  Healing shingles rash Lungs: Clear Abdomen: Soft, nontender, + distended.  + colostomyNo hepatosplenomegaly. No bruits or masses. Good bowel sounds. Extremities: No cyanosis, clubbing, rash, 2+ edema RUE AVF Neuro: Alert & orientedx3, cranial nerves grossly intact. moves all 4 extremities w/o difficulty. Affect pleasant   Telemetry   AF 70s Personally reviewed   EKG    N/A  Labs    CBC  Recent Labs  06/17/17 0936 06/18/17 0549  WBC 7.1 5.9  HGB 11.2* 10.3*  HCT 35.7* 32.4*  MCV 99.7 97.9  PLT 112* 025*   Basic Metabolic Panel  Recent Labs  06/17/17 0936 06/18/17 0549  NA 131* 134*  K 3.7 3.5  CL 97* 97*  CO2 22 25  GLUCOSE  112* 162*  BUN 60* 59*  CREATININE 2.86* 2.64*  CALCIUM 9.0 9.1  MG  --  2.3   Liver Function Tests No results for input(s): AST, ALT, ALKPHOS, BILITOT, PROT, ALBUMIN in the last 72 hours. No results for input(s): LIPASE, AMYLASE in the last 72 hours. Cardiac Enzymes No results for input(s): CKTOTAL, CKMB, CKMBINDEX, TROPONINI in the last 72 hours.  BNP: BNP (last 3 results)  Recent Labs  06/13/17 1205  BNP 1,151.0*    ProBNP (last 3 results) No results for input(s): PROBNP in the last 8760 hours.   D-Dimer No results for input(s): DDIMER in the last 72 hours. Hemoglobin A1C No results for input(s): HGBA1C in the last 72 hours. Fasting Lipid Panel No results for input(s): CHOL, HDL, LDLCALC, TRIG, CHOLHDL, LDLDIRECT in the last 72 hours. Thyroid Function Tests No results for input(s): TSH, T4TOTAL, T3FREE, THYROIDAB in the last 72 hours.  Invalid input(s): FREET3  Other results:   Imaging     No results found.   Medications:     Scheduled Medications: . atorvastatin  40 mg Oral QHS  . insulin aspart  0-5 Units Subcutaneous QHS  . insulin  aspart  0-9 Units Subcutaneous TID WC  . insulin aspart  5 Units Subcutaneous TID WC  . insulin glargine  15 Units Subcutaneous Q2200  . levothyroxine  25 mcg Oral QAC breakfast  . pantoprazole  40 mg Oral Daily  . potassium chloride SA  40 mEq Oral Daily  . sodium chloride flush  3 mL Intravenous Q12H  . warfarin  2 mg Oral ONCE-1800  . Warfarin - Pharmacist Dosing Inpatient   Does not apply Q24H    Infusions: . sodium chloride    . furosemide Stopped (06/18/17 1112)  . milrinone 0.125 mcg/kg/min (06/17/17 1520)     PRN Medications:  sodium chloride, sodium chloride flush    Patient Profile   Mr Rideaux is a 61 year old with a history of DMII, [place] 2016, rectal cancer, CKD Stage IV, chronic A fib, S/P AVN ablation, PAF, CAD BMS LAD 2011 BMS LAD 2009, OSA, chronic systolic heart failure, St Jude BiV ICD  admitted to [facility] with increased lower extremity edema despite increasing home diuretic regimen to torsemide 80 mg in am and 40 mg in pm.  Transferred to [facility] on 7/13 for further management of HF and AKI.    Assessment/Plan   1. 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 Repeat ECHO .  - Admitted with 20 pounds of volume overload and AKI (baseline weight about 170) - Lasix increased and milrinone added yesterday. Weight down 5 pounds. Creatinine improved  - Still with dyspnea and bloating but improving - Will continue lasix 120 IV bid and milrinone 0.125 and metolazone 5 mg daily.  - Watch renal function closely  2. Chronic A fib- Rate controlled. - On coumadin. INR 2.01. Discussed with PharmD personally  3. Acute on CKD Stage IV - creatinine baseline 2.8-3.0 Last year he required short term dialysis. Has RUE AVF.   -creatinine peaked at 2.9 now improving with diuresis and inotropic support  4. DMII - on SSI + lantus.   5. CAD  - No s/s of ischemia  - BMS 2001 2009 LAD. On statin.   6. H/O Rectal CA with diverting colostomy  - Stable  7. Hypokalemia - Will supp    Length of Stay: 5   Glori Bickers, MD  06/18/2017, 11:34 AM  Advanced Heart Failure Team Pager [phone] (M-F; 7a - 4p)  Please contact [facility] for night-coverage after hours (4p -7a ) and weekends on [email or web address]

## 2017-06-18 NOTE — Progress Notes (Signed)
Paged [facility] to make aware of Cardiac event.

## 2017-06-18 NOTE — Progress Notes (Signed)
ANTICOAGULATION CONSULT NOTE - Follow Up Consult  Pharmacy Consult for Coumadin Indication: atrial fibrillation  No Known Allergies  Patient Measurements: Height: 5\' 10"  (177.8 cm) Weight: 189 lb 4.8 oz (85.9 kg) IBW/kg (Calculated) : 73  Vital Signs: Temp: 98.2 F (36.8 C) (07/14 0436) Temp Source: Oral (07/14 0436) BP: 101/50 (07/14 0436) Pulse Rate: 75 (07/14 0436)  Labs:  Recent Labs  06/16/17 0503 06/17/17 0936 06/18/17 0549  HGB 10.4* 11.2* 10.3*  HCT 31.9* 35.7* 32.4*  PLT 115* 112* 131*  LABPROT 24.3* 22.2* 23.0*  INR 2.14 1.92 2.01  CREATININE 2.84* 2.86* 2.64*   Estimated Creatinine Clearance: 30 mL/min (A) (by C-G formula based on SCr of 2.64 mg/dL (H)).  Assessment: 60yom continues on coumadin for afib. INR at low end of goal this morning 2.01. CBC with slight drop from yesterday 11.2>10.3. No bleeding reported.  Home dose: 2mg  daily except 1mg  on Tuesday/Thursday  Goal of Therapy:  INR 2-3 Monitor platelets by anticoagulation protocol: Yes   Plan:  1) Warfarin 2mg  x1 tonight 2) Daily INR 3) Monitor for s/sx of bleeding  Nicole Kindred L Chirstopher Iovino 06/18/2017,11:08 AM

## 2017-06-19 LAB — CBC
HCT: 30.4 % — ABNORMAL LOW (ref 39.0–52.0)
Hemoglobin: 9.8 g/dL — ABNORMAL LOW (ref 13.0–17.0)
MCH: 31.6 pg (ref 26.0–34.0)
MCHC: 32.2 g/dL (ref 30.0–36.0)
MCV: 98.1 fL (ref 78.0–100.0)
PLATELETS: 123 10*3/uL — AB (ref 150–400)
RBC: 3.1 MIL/uL — ABNORMAL LOW (ref 4.22–5.81)
RDW: 16.5 % — AB (ref 11.5–15.5)
WBC: 6.1 10*3/uL (ref 4.0–10.5)

## 2017-06-19 LAB — BASIC METABOLIC PANEL
Anion gap: 11 (ref 5–15)
BUN: 58 mg/dL — AB (ref 6–20)
CHLORIDE: 98 mmol/L — AB (ref 101–111)
CO2: 24 mmol/L (ref 22–32)
CREATININE: 2.66 mg/dL — AB (ref 0.61–1.24)
Calcium: 8.9 mg/dL (ref 8.9–10.3)
GFR calc Af Amer: 28 mL/min — ABNORMAL LOW (ref >60)
GFR calc non Af Amer: 24 mL/min — ABNORMAL LOW (ref >60)
Glucose, Bld: 176 mg/dL — ABNORMAL HIGH (ref 65–99)
Potassium: 3.5 mmol/L (ref 3.5–5.1)
Sodium: 133 mmol/L — ABNORMAL LOW (ref 135–145)

## 2017-06-19 LAB — GLUCOSE, CAPILLARY
GLUCOSE-CAPILLARY: 182 mg/dL — AB (ref 65–99)
Glucose-Capillary: 165 mg/dL — ABNORMAL HIGH (ref 65–99)
Glucose-Capillary: 170 mg/dL — ABNORMAL HIGH (ref 65–99)
Glucose-Capillary: 226 mg/dL — ABNORMAL HIGH (ref 65–99)

## 2017-06-19 LAB — PROTIME-INR
INR: 2.31
Prothrombin Time: 25.8 seconds — ABNORMAL HIGH (ref 11.4–15.2)

## 2017-06-19 MED ORDER — WARFARIN SODIUM 2 MG PO TABS: 1.0000 mg | ORAL_TABLET | ORAL | Status: DC

## 2017-06-19 MED ORDER — FUROSEMIDE 10 MG/ML IJ SOLN
80.0000 mg | Freq: Two times a day (BID) | INTRAMUSCULAR | Status: DC
  Administered 2017-06-19: 80 mg via INTRAVENOUS
  Filled 2017-06-19: qty 8

## 2017-06-19 MED ORDER — WARFARIN SODIUM 2 MG PO TABS
2.0000 mg | ORAL_TABLET | ORAL | Status: DC
  Administered 2017-06-19 – 2017-06-20 (×2): 2 mg via ORAL
  Filled 2017-06-19 (×2): qty 1

## 2017-06-19 MED ORDER — POTASSIUM CHLORIDE CRYS ER 20 MEQ PO TBCR
40.0000 meq | EXTENDED_RELEASE_TABLET | Freq: Once | ORAL | Status: AC
  Administered 2017-06-19: 40 meq via ORAL
  Filled 2017-06-19: qty 2

## 2017-06-19 NOTE — Progress Notes (Addendum)
Advanced Heart Failure Rounding Note    Subjective:    Started on milrinone 7/13 and lasix increased due to sluggish response to diuresis  Continues with brisk diuresis. Weight down another 8 pounds overnight. Now down 15 pounds total.   Breathing much better. Creatinine stable at 2.6    Objective:   Weight Range: 82.4 kg (181 lb 9.6 oz) Body mass index is 26.06 kg/m.   Vital Signs:   Temp:  [97.7 F (36.5 C)-98.4 F (36.9 C)] 98 F (36.7 C) (07/15 0551) Pulse Rate:  [70-79] 72 (07/15 0551) Resp:  [18-19] 19 (07/15 0551) BP: (97-108)/(51-58) 97/51 (07/15 0551) SpO2:  [96 %-99 %] 96 % (07/15 0551) Weight:  [82.4 kg (181 lb 9.6 oz)] 82.4 kg (181 lb 9.6 oz) (07/15 0551) Last BM Date: 06/17/17  Weight change: Filed Weights   06/17/17 0521 06/18/17 0436 06/19/17 0551  Weight: 88.1 kg (194 lb 4.8 oz) 85.9 kg (189 lb 4.8 oz) 82.4 kg (181 lb 9.6 oz)    Intake/Output:   Intake/Output Summary (Last 24 hours) at 06/19/17 1026 Last data filed at 06/19/17 0552  Gross per 24 hour  Intake           824.71 ml  Output             3525 ml  Net         -2700.29 ml      Physical Exam    General:  Sitting in chair NAD HEENT: Normal Neck: Supple. JVP 8-9  Carotids 2+ bilat; no bruits. No lymphadenopathy or thyromegaly appreciated. Cor: PMI laterally displaced. IRR 2/6 Robert Lungs: clear. Mildly decreased in bases Abdomen: soft NT. Minimally distended.  + colostomy No hepatosplenomegaly. No bruits or masses. Good bowel sounds. Extremities: No cyanosis, clubbing, rash, 1+ edema RUE AVF with thrill Neuro: alert & oriented x 3, cranial nerves grossly intact. moves all 4 extremities w/o difficulty. Affect pleasant    Telemetry   AF 70-80s Personally reviewed   EKG    N/A  Labs    CBC  Recent Labs  06/18/17 0549 06/19/17 0410  WBC 5.9 6.1  HGB 10.3* 9.8*  HCT 32.4* 30.4*  MCV 97.9 98.1  PLT 131* 301*   Basic Metabolic Panel  Recent Labs  06/18/17 0549  06/18/17 1824 06/19/17 0410  NA 134* 135 133*  K 3.5 3.7 3.5  CL 97* 100* 98*  CO2 25 26 24   GLUCOSE 162* 164* 176*  BUN 59* 56* 58*  CREATININE 2.64* 2.65* 2.66*  CALCIUM 9.1 9.0 8.9  MG 2.3  --   --    Liver Function Tests No results for input(s): AST, ALT, ALKPHOS, BILITOT, PROT, ALBUMIN in the last 72 hours. No results for input(s): LIPASE, AMYLASE in the last 72 hours. Cardiac Enzymes No results for input(s): CKTOTAL, CKMB, CKMBINDEX, TROPONINI in the last 72 hours.  BNP: BNP (last 3 results)  Recent Labs  06/13/17 1205  BNP 1,151.0*    ProBNP (last 3 results) No results for input(s): PROBNP in the last 8760 hours.   D-Dimer No results for input(s): DDIMER in the last 72 hours. Hemoglobin A1C No results for input(s): HGBA1C in the last 72 hours. Fasting Lipid Panel No results for input(s): CHOL, HDL, LDLCALC, TRIG, CHOLHDL, LDLDIRECT in the last 72 hours. Thyroid Function Tests No results for input(s): TSH, T4TOTAL, T3FREE, THYROIDAB in the last 72 hours.  Invalid input(s): FREET3  Other results:   Imaging    No results  found.   Medications:     Scheduled Medications: . atorvastatin  40 mg Oral QHS  . insulin aspart  0-5 Units Subcutaneous QHS  . insulin aspart  0-9 Units Subcutaneous TID WC  . insulin aspart  5 Units Subcutaneous TID WC  . insulin glargine  15 Units Subcutaneous Q2200  . levothyroxine  25 mcg Oral QAC breakfast  . pantoprazole  40 mg Oral Daily  . potassium chloride SA  40 mEq Oral Daily  . sodium chloride flush  3 mL Intravenous Q12H  . [START ON 06/21/2017] warfarin  1 mg Oral Once per day on Tue Thu  . warfarin  2 mg Oral Once per day on Sun Mon Wed Fri Sat  . Warfarin - Pharmacist Dosing Inpatient   Does not apply Q24H    Infusions: . sodium chloride    . furosemide Stopped (06/19/17 0909)  . milrinone 0.125 mcg/kg/min (06/18/17 1306)    PRN Medications: sodium chloride, sodium chloride flush    Patient Profile    [name] is a 61 year old with a history of DMII, [place] 2016, rectal cancer, CKD Stage IV, chronic A fib, S/P AVN ablation, PAF, CAD BMS LAD 2011 BMS LAD 2009, OSA, chronic systolic heart failure, St Jude BiV ICD admitted to [facility] with increased lower extremity edema despite increasing home diuretic regimen to torsemide 80 mg in am and 40 mg in pm.  Transferred to [facility] on 7/13 for further management of HF and AKI.    Assessment/Plan   1. 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 Repeat ECHO .  - Admitted with 25 pounds of volume overload and AKI (baseline weight about 175) - Lasix increased and milrinone added 7/13. Weight down 15 pounds. Creatinine improved from admit/stable overnight - Still about 5-10 pounds up. I worry about diuresing too quickly.  Will decrease lasix to 80 IV bid and keep milrinone at 0.125. Continue metolazone 5 mg daily.  - Watch renal function closely  - May be able to go home tomorrow on previous home lasix dose. Has f/u with Dr. Hinda Lenis in Nephrology on Wednesday.  2. Chronic A fib- Rate controlled. - On coumadin. INR 2.31. Discussed with PharmD personally  3. Acute on CKD Stage IV - creatinine baseline 2.8-3.0 Last year he required short term dialysis. Has RUE AVF.   -creatinine peaked at 2.9 now stabilized at 2.6 with diuresis and inotropic support - continue to follow closely.   4. DMII - on SSI + lantus.  - CBGs 16-170  5. CAD  - No s/s of ischemia  - BMS 2001 2009 LAD. On statin.   6. H/O Rectal CA with diverting colostomy  - Stable  7. Hypokalemia - K 3.5  Will supp    Length of Stay: 6   Glori Bickers, MD  06/19/2017, 10:26 AM  Advanced Heart Failure Team Pager [phone] (M-F; 7a - 4p)  Please contact [facility] for night-coverage after hours (4p -7a ) and weekends on [email or web address]

## 2017-06-19 NOTE — Progress Notes (Signed)
ANTICOAGULATION CONSULT NOTE - Follow Up Consult  Pharmacy Consult for Coumadin Indication: atrial fibrillation  No Known Allergies  Patient Measurements: Height: 5\' 10"  (177.8 cm) Weight: 181 lb 9.6 oz (82.4 kg) (c scale) IBW/kg (Calculated) : 73  Vital Signs: Temp: 98 F (36.7 C) (07/15 0551) Temp Source: Oral (07/15 0551) BP: 97/51 (07/15 0551) Pulse Rate: 72 (07/15 0551)  Labs:  Recent Labs  06/17/17 0936 06/18/17 0549 06/18/17 1824 06/19/17 0410  HGB 11.2* 10.3*  --  9.8*  HCT 35.7* 32.4*  --  30.4*  PLT 112* 131*  --  123*  LABPROT 22.2* 23.0*  --  25.8*  INR 1.92 2.01  --  2.31  CREATININE 2.86* 2.64* 2.65* 2.66*   Estimated Creatinine Clearance: 29.7 mL/min (A) (by C-G formula based on SCr of 2.66 mg/dL (H)).  Assessment: 60yom continues on coumadin for afib. INR at goal this morning 2.3. CBC reasonably stable known Hx thrombocytopenia. No bleeding reported.  Home dose: 2mg  daily except 1mg  on Tuesday/Thursday  Goal of Therapy:  INR 2-3 Monitor platelets by anticoagulation protocol: Yes   Plan:  1) Restart Warfarin 2mg  daily except 1mg  TTh - as home dose 2) Daily INR 3) Monitor for s/sx of bleeding  Bonnita Nasuti Pharm.D. CPP, BCPS Clinical Pharmacist [phone] 06/19/2017 8:46 AM

## 2017-06-19 NOTE — Progress Notes (Signed)
Pt states he changed his colostomy bag today, it is the third time since he's been here.   Darcia Lampi Leory Plowman

## 2017-06-20 ENCOUNTER — Ambulatory Visit: Payer: Medicare Other | Admitting: Nurse Practitioner

## 2017-06-20 LAB — GLUCOSE, CAPILLARY
GLUCOSE-CAPILLARY: 178 mg/dL — AB (ref 65–99)
GLUCOSE-CAPILLARY: 134 mg/dL — AB (ref 65–99)
Glucose-Capillary: 131 mg/dL — ABNORMAL HIGH (ref 65–99)
Glucose-Capillary: 155 mg/dL — ABNORMAL HIGH (ref 65–99)

## 2017-06-20 LAB — CBC
HEMATOCRIT: 29.3 % — AB (ref 39.0–52.0)
HEMOGLOBIN: 9.4 g/dL — AB (ref 13.0–17.0)
MCH: 31.3 pg (ref 26.0–34.0)
MCHC: 32.1 g/dL (ref 30.0–36.0)
MCV: 97.7 fL (ref 78.0–100.0)
Platelets: 123 10*3/uL — ABNORMAL LOW (ref 150–400)
RBC: 3 MIL/uL — ABNORMAL LOW (ref 4.22–5.81)
RDW: 16.2 % — AB (ref 11.5–15.5)
WBC: 5.7 10*3/uL (ref 4.0–10.5)

## 2017-06-20 LAB — PROTIME-INR
INR: 2.5
Prothrombin Time: 27.5 seconds — ABNORMAL HIGH (ref 11.4–15.2)

## 2017-06-20 LAB — BASIC METABOLIC PANEL
ANION GAP: 11 (ref 5–15)
BUN: 57 mg/dL — AB (ref 6–20)
CHLORIDE: 96 mmol/L — AB (ref 101–111)
CO2: 26 mmol/L (ref 22–32)
Calcium: 8.9 mg/dL (ref 8.9–10.3)
Creatinine, Ser: 2.57 mg/dL — ABNORMAL HIGH (ref 0.61–1.24)
GFR calc Af Amer: 29 mL/min — ABNORMAL LOW (ref >60)
GFR, EST NON AFRICAN AMERICAN: 25 mL/min — AB (ref >60)
GLUCOSE: 149 mg/dL — AB (ref 65–99)
Potassium: 3.6 mmol/L (ref 3.5–5.1)
Sodium: 133 mmol/L — ABNORMAL LOW (ref 135–145)

## 2017-06-20 MED ORDER — METOLAZONE 5 MG PO TABS
5.0000 mg | ORAL_TABLET | Freq: Once | ORAL | Status: AC
  Administered 2017-06-20: 5 mg via ORAL
  Filled 2017-06-20: qty 1

## 2017-06-20 MED ORDER — POTASSIUM CHLORIDE CRYS ER 20 MEQ PO TBCR
40.0000 meq | EXTENDED_RELEASE_TABLET | Freq: Once | ORAL | Status: AC
  Administered 2017-06-20: 40 meq via ORAL
  Filled 2017-06-20: qty 2

## 2017-06-20 MED ORDER — FUROSEMIDE 10 MG/ML IJ SOLN
120.0000 mg | Freq: Two times a day (BID) | INTRAVENOUS | Status: AC
  Administered 2017-06-20 (×2): 120 mg via INTRAVENOUS
  Filled 2017-06-20: qty 12
  Filled 2017-06-20: qty 10

## 2017-06-20 NOTE — Discharge Summary (Signed)
Advanced Heart Failure Discharge Note  Discharge Summary   Patient ID: [name] MRN: [id number], DOB/AGE: 07/25/1956 61 y.o. Admit date: 06/13/2017 D/C date:     06/21/2017   Primary Discharge Diagnoses:  1. 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 Chronic A fib- Rate controlled. 3. Acute on CKD Stage IV 4. DMII 5. CAD 6. H/O Rectal CA with diverting colostomy  7. Hypokalemia   Hospital Course:   [name] is a 61 year old with a history of DMII, [place] 2016, rectal cancer, CKD Stage IV, chronic A fib, S/P AVN ablation, PAF, CAD BMS LAD 2011 BMS LAD 2009, OSA, chronic systolic heart failure, St Jude BiV ICD admitted to [facility] with increased lower extremity edema despite increasing home diuretic regimen to torsemide 80 mg in am and 40 mg in pm. Transferred to [facility] on 7/13 for further management of HF and AKI.   Milrinone started 06/17/17 and lasix increased with sluggish diuresis. Renal function stabilized with addition of milrinone. He was diuresed with IV lasix + metolazone. Milrinone was later stopped.  Creatinine peaked at 2.9 and was down to 2.4 on the day of discharge. Over diuresed 18 pounds.   Hospital course complicated by patients advanced kidney disease. Renal function improved with addition of milrinone to augment diuresis, and will keep close renal follow up. He has follow up with Nephrology tomorrow.   Pt will go home today in stable condition with follow up with Renal, Coumadin Clinic, and AHF clinic as below.   Discharge Weight : 174 pounds.  Discharge Vitals: Blood pressure (!) 100/50, pulse 79, temperature 97.9 F (36.6 C), temperature source Oral, resp. rate 17, height 5\' 10"  (1.778 m), weight 174 lb 8 oz (79.2 kg), SpO2 99 %.  Labs: Lab Results  Component Value Date   WBC 5.7 06/21/2017   HGB 10.1 (L) 06/21/2017   HCT 31.9 (L) 06/21/2017   MCV 97.0 06/21/2017   PLT 132 (L) 06/21/2017      Recent Labs Lab 06/21/17 0221  NA 131*  K 3.4*  CL 93*  CO2 28  BUN 54*  CREATININE 2.49*  CALCIUM 9.3  GLUCOSE 142*   Lab Results  Component Value Date   CHOL 163 05/10/2016   HDL 27 (L) 05/10/2016   LDLCALC 117 (H) 05/10/2016   TRIG 96 05/10/2016   BNP (last 3 results)  Recent Labs  06/13/17 1205  BNP 1,151.0*    ProBNP (last 3 results) No results for input(s): PROBNP in the last 8760 hours.   Diagnostic Studies/Procedures   No results found.  Discharge Medications   Allergies as of 06/21/2017   No Known Allergies     Medication List    TAKE these medications   atorvastatin 40 MG tablet Commonly known as:  LIPITOR Take 1 tablet (40 mg total) by mouth daily. What changed:  when to take this   collagenase ointment Commonly known as:  SANTYL Apply topically daily. What changed:  when to take this  additional instructions   CoQ10 50 MG Caps Take 1 capsule by mouth daily.   ferrous sulfate 325 (65 FE) MG tablet Take 1 tablet (325 mg total) by mouth 3 (three) times daily with meals.   glucose blood test strip Use to test blood sugar 3 times daily   insulin aspart 100 UNIT/ML FlexPen Commonly known as:  NOVOLOG FLEXPEN 5 units before each meal What changed:  how much to take  how to take this  when to take this  additional instructions   Insulin Glargine 100 UNIT/ML Solostar Pen Commonly known as:  LANTUS SOLOSTAR Inject 15 Units into the skin daily at 10 pm.   levothyroxine 25 MCG tablet Commonly known as:  SYNTHROID, LEVOTHROID Take 25 mcg by mouth daily before breakfast.   metolazone 2.5 MG tablet Commonly known as:  ZAROXOLYN take 1 tablet by mouth weekly if needed for SWELLING   metoprolol succinate 25 MG 24 hr tablet Commonly known as:  TOPROL-XL Take 0.5 tablets (12.5 mg total) by mouth 2 (two) times daily. What changed:  See the new instructions.   nitroGLYCERIN 0.4 MG SL tablet Commonly known as:   NITROSTAT Place 1 tablet (0.4 mg total) under the tongue every 5 (five) minutes as needed for chest pain.   pantoprazole 40 MG tablet Commonly known as:  PROTONIX Take 1 tablet (40 mg total) by mouth daily.   polyethylene glycol packet Commonly known as:  MIRALAX / GLYCOLAX Take 17 g by mouth daily as needed for mild constipation.   potassium chloride SA 20 MEQ tablet Commonly known as:  K-DUR,KLOR-CON Take 2 tablets (40 mEq total) by mouth daily. What changed:  how much to take   PROAIR RESPICLICK 641 (90 Base) MCG/ACT Aepb Generic drug:  Albuterol Sulfate Inhale 1 puff into the lungs every 6 (six) hours as needed (for breathing).   torsemide 20 MG tablet Commonly known as:  DEMADEX TAKE 4 TABLETS IN THE MORNING AND 2 TABLET IN THE EVENING   warfarin 2 MG tablet Commonly known as:  COUMADIN Take 1 tablet daily except 1/2 tablet on Tuesdays and Fridays What changed:  additional instructions       Disposition   The patient will be discharged in stable condition to home. Discharge Instructions    (HEART FAILURE PATIENTS) Call MD:  Anytime you have any of the following symptoms: 1) 3 pound weight gain in 24 hours or 5 pounds in 1 week 2) shortness of breath, with or without a dry hacking cough 3) swelling in the hands, feet or stomach 4) if you have to sleep on extra pillows at night in order to breathe.    Complete by:  As directed    Diet - low sodium heart healthy    Complete by:  As directed    Heart Failure patients record your daily weight using the same scale at the same time of day    Complete by:  As directed    Increase activity slowly    Complete by:  As directed      Follow-up Information    [facility] Follow up on 06/22/2017.   Specialty:  Cardiology Why:  at 1040 am for coumadin check.  Contact information: [place] [phone]            Duration of Discharge Encounter: Greater than 35 minutes    Signed, Darrick Grinder, NP 06/21/2017, 8:28 AM

## 2017-06-20 NOTE — Progress Notes (Signed)
ANTICOAGULATION CONSULT NOTE - Follow Up Consult  Pharmacy Consult for Coumadin Indication: atrial fibrillation  No Known Allergies  Patient Measurements: Height: 5\' 10"  (177.8 cm) Weight: 180 lb 12.8 oz (82 kg) IBW/kg (Calculated) : 73  Vital Signs: Temp: 98 F (36.7 C) (07/16 0433) Temp Source: Oral (07/16 0433) BP: 107/51 (07/16 0433) Pulse Rate: 73 (07/16 0433)  Labs:  Recent Labs  06/18/17 0549 06/18/17 1824 06/19/17 0410 06/20/17 0332  HGB 10.3*  --  9.8* 9.4*  HCT 32.4*  --  30.4* 29.3*  PLT 131*  --  123* 123*  LABPROT 23.0*  --  25.8* 27.5*  INR 2.01  --  2.31 2.50  CREATININE 2.64* 2.65* 2.66* 2.57*   Estimated Creatinine Clearance: 30.8 mL/min (A) (by C-G formula based on SCr of 2.57 mg/dL (H)).  Assessment: [name] continues on coumadin for afib. INR at goal this morning 2.5. CBC reasonably stable known Hx thrombocytopenia. No bleeding reported.  Home dose: 2mg  daily except 1mg  on Tuesday/Thursday  Goal of Therapy:  INR 2-3 Monitor platelets by anticoagulation protocol: Yes   Plan:  1) Continue coumadin 2mg  daily except 1mg  on Tuesday/Thursday 2) Daily INR  Nena Jordan, PharmD, BCPS 06/20/2017 9:12 AM

## 2017-06-20 NOTE — Consult Note (Signed)
   [facility] CM Inpatient Consult   06/20/2017  KEILAND PICKERING 1956/03/30 [id number]  Patient was assessed for [facility] for restart of  community services in the Medicare [place]. Patient was previously active with [place] [facility] around a year ago and patient withdrew from program..  Met with patient at bedside regarding being restarted with [facility] services and disposition needs.  Patient sitting up eating lunch states, "I am doing fine now, all I had was a little fluid build up. Inquired about Milrinone and he states, "I don't need that at home, they said something about it but I won't need it.  I'll be alright with this fluid getting off me, I don't need anything extra right now."  Patient states he has the brochure but did accept contact information, declining services at this time.  For additional questions or referrals please contact:   Natividad Brood, RN BSN [facility] Liaison  [phone] business mobile phone Toll free office [phone]

## 2017-06-20 NOTE — Progress Notes (Signed)
Advanced Heart Failure Rounding Note    Subjective:    Started on milrinone 7/13 and lasix increased due to sluggish response to diuresis  Remains on milrinone. Lasix decreased for 120 IV bid to 80 IV bid Continues with brisk diuresis. Out over 3L but weight listed as unchanged. (we reweighed to confirm)   Denies SOB. Eager to go home  Creatinine stable at 2.6. INR 2.5   Objective:   Weight Range: 82.5 kg (181 lb 12.8 oz) Body mass index is 26.09 kg/m.   Vital Signs:   Temp:  [97.5 F (36.4 C)-98 F (36.7 C)] 98 F (36.7 C) (07/16 0433) Pulse Rate:  [71-74] 73 (07/16 0433) Resp:  [17-19] 17 (07/16 0433) BP: (97-120)/(51-60) 107/51 (07/16 0433) SpO2:  [96 %-100 %] 100 % (07/16 0433) Weight:  [82.4 kg (181 lb 9.6 oz)-82.5 kg (181 lb 12.8 oz)] 82.5 kg (181 lb 12.8 oz) (07/16 0500) Last BM Date: 06/19/17  Weight change: Filed Weights   06/18/17 0436 06/19/17 0551 06/20/17 0500  Weight: 85.9 kg (189 lb 4.8 oz) 82.4 kg (181 lb 9.6 oz) 82.5 kg (181 lb 12.8 oz)    Intake/Output:   Intake/Output Summary (Last 24 hours) at 06/20/17 0519 Last data filed at 06/20/17 0436  Gross per 24 hour  Intake          1100.54 ml  Output             4700 ml  Net         -3599.46 ml      Physical Exam    General:  Well appearing. No resp difficulty HEENT: normal Neck: supple. jvp 9 Carotids 2+ bilat; no bruits. No lymphadenopathy or thryomegaly appreciated. Cor: PMI nondisplaced. IRR. No rubs, gallops or murmurs. Lungs: clear Abdomen: soft, nontender, mildly distended. No hepatosplenomegaly. + colostomy Extremities: no cyanosis, clubbing, rash, 1+ edema ted hose present Neuro: alert & orientedx3, cranial nerves grossly intact. moves all 4 extremities w/o difficulty. Affect pleasant   Telemetry   AF 70-80s Personally reviewed    EKG    N/A  Labs    CBC  Recent Labs  06/19/17 0410 06/20/17 0332  WBC 6.1 5.7  HGB 9.8* 9.4*  HCT 30.4* 29.3*  MCV 98.1 97.7    PLT 123* 676*   Basic Metabolic Panel  Recent Labs  06/18/17 0549  06/19/17 0410 06/20/17 0332  NA 134*  < > 133* 133*  K 3.5  < > 3.5 3.6  CL 97*  < > 98* 96*  CO2 25  < > 24 26  GLUCOSE 162*  < > 176* 149*  BUN 59*  < > 58* 57*  CREATININE 2.64*  < > 2.66* 2.57*  CALCIUM 9.1  < > 8.9 8.9  MG 2.3  --   --   --   < > = values in this interval not displayed. Liver Function Tests No results for input(s): AST, ALT, ALKPHOS, BILITOT, PROT, ALBUMIN in the last 72 hours. No results for input(s): LIPASE, AMYLASE in the last 72 hours. Cardiac Enzymes No results for input(s): CKTOTAL, CKMB, CKMBINDEX, TROPONINI in the last 72 hours.  BNP: BNP (last 3 results)  Recent Labs  06/13/17 1205  BNP 1,151.0*    ProBNP (last 3 results) No results for input(s): PROBNP in the last 8760 hours.   D-Dimer No results for input(s): DDIMER in the last 72 hours. Hemoglobin A1C No results for input(s): HGBA1C in the last 72 hours. Fasting Lipid  Panel No results for input(s): CHOL, HDL, LDLCALC, TRIG, CHOLHDL, LDLDIRECT in the last 72 hours. Thyroid Function Tests No results for input(s): TSH, T4TOTAL, T3FREE, THYROIDAB in the last 72 hours.  Invalid input(s): FREET3  Other results:   Imaging    No results found.   Medications:     Scheduled Medications: . atorvastatin  40 mg Oral QHS  . furosemide  80 mg Intravenous BID  . insulin aspart  0-5 Units Subcutaneous QHS  . insulin aspart  0-9 Units Subcutaneous TID WC  . insulin aspart  5 Units Subcutaneous TID WC  . insulin glargine  15 Units Subcutaneous Q2200  . levothyroxine  25 mcg Oral QAC breakfast  . pantoprazole  40 mg Oral Daily  . potassium chloride SA  40 mEq Oral Daily  . sodium chloride flush  3 mL Intravenous Q12H  . [START ON 06/21/2017] warfarin  1 mg Oral Once per day on Tue Thu  . warfarin  2 mg Oral Once per day on Sun Mon Wed Fri Sat  . Warfarin - Pharmacist Dosing Inpatient   Does not apply Q24H     Infusions: . sodium chloride    . milrinone 0.125 mcg/kg/min (06/19/17 2029)    PRN Medications: sodium chloride, sodium chloride flush    Patient Profile   Mr Stupka is a 61 year old with a history of DMII, [place] 2016, rectal cancer, CKD Stage IV, chronic A fib, S/P AVN ablation, PAF, CAD BMS LAD 2011 BMS LAD 2009, OSA, chronic systolic heart failure, St Jude BiV ICD admitted to [facility] with increased lower extremity edema despite increasing home diuretic regimen to torsemide 80 mg in am and 40 mg in pm.  Transferred to [facility] on 7/13 for further management of HF and AKI.    Assessment/Plan   1. 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 Repeat ECHO .  - Admitted with 25 pounds of volume overload and AKI (baseline weight about 175) - Lasix increased and milrinone added 7/13. Weight down 15 pounds. Creatinine improved from admit/stable overnight - I was hoping to stop milrinone and d/c home today on previous regimen but weight still up 5 pounds. Will increase lasix back to 120IV bid and give metolazone 5mg  x 1 and reassess this afternoon for d/c. If weight 177 or less ok to go.  - Renal function stable   - May be able to go home tomorrow on previous home lasix dose. Has f/u with Dr. Hinda Lenis in Nephrology on Wednesday.  2. Chronic A fib- Rate controlled. - On coumadin. INR 2.50 this am.   3. Acute on CKD Stage IV - creatinine baseline 2.8-3.0 Last year he required short term dialysis. Has RUE AVF.   -creatinine peaked at 2.9 now stabilized at 2.6 with diuresis and inotropic support - continue to follow closely.   4. DMII - on SSI + lantus.  - CBGs 16-170  5. CAD  - No s/s of ischemia  - BMS 2001 2009 LAD. On statin.   6. H/O Rectal CA with diverting colostomy  - Stable  7. Hypokalemia - K 3.6  Will supp    Length of Stay: 7   Glori Bickers, MD  06/20/2017, 5:19  AM  Advanced Heart Failure Team Pager [phone] (M-F; 7a - 4p)  Please contact [facility] for night-coverage after hours (4p -7a ) and weekends on [email or web address]

## 2017-06-20 NOTE — Care Management Important Message (Signed)
Important Message  Patient Details  Name: [name] MRN: [id number] Date of Birth: 09-09-1956   Medicare Important Message Given:  Yes    Orbie Pyo 06/20/2017, 2:08 PM

## 2017-06-20 NOTE — Progress Notes (Signed)
Pt stated that he took a really good bath yesterday with his wife so he doesn't need one today

## 2017-06-21 LAB — BASIC METABOLIC PANEL
Anion gap: 10 (ref 5–15)
BUN: 54 mg/dL — AB (ref 6–20)
CO2: 28 mmol/L (ref 22–32)
CREATININE: 2.49 mg/dL — AB (ref 0.61–1.24)
Calcium: 9.3 mg/dL (ref 8.9–10.3)
Chloride: 93 mmol/L — ABNORMAL LOW (ref 101–111)
GFR calc Af Amer: 30 mL/min — ABNORMAL LOW (ref >60)
GFR, EST NON AFRICAN AMERICAN: 26 mL/min — AB (ref >60)
GLUCOSE: 142 mg/dL — AB (ref 65–99)
POTASSIUM: 3.4 mmol/L — AB (ref 3.5–5.1)
Sodium: 131 mmol/L — ABNORMAL LOW (ref 135–145)

## 2017-06-21 LAB — PROTIME-INR
INR: 2.47
PROTHROMBIN TIME: 27.3 s — AB (ref 11.4–15.2)

## 2017-06-21 LAB — CBC
HEMATOCRIT: 31.9 % — AB (ref 39.0–52.0)
Hemoglobin: 10.1 g/dL — ABNORMAL LOW (ref 13.0–17.0)
MCH: 30.7 pg (ref 26.0–34.0)
MCHC: 31.7 g/dL (ref 30.0–36.0)
MCV: 97 fL (ref 78.0–100.0)
PLATELETS: 132 10*3/uL — AB (ref 150–400)
RBC: 3.29 MIL/uL — ABNORMAL LOW (ref 4.22–5.81)
RDW: 15.5 % (ref 11.5–15.5)
WBC: 5.7 10*3/uL (ref 4.0–10.5)

## 2017-06-21 LAB — GLUCOSE, CAPILLARY: Glucose-Capillary: 122 mg/dL — ABNORMAL HIGH (ref 65–99)

## 2017-06-21 MED ORDER — TORSEMIDE 20 MG PO TABS: ORAL_TABLET | ORAL | 6 refills | Status: DC

## 2017-06-21 MED ORDER — METOPROLOL SUCCINATE ER 25 MG PO TB24: 12.5000 mg | ORAL_TABLET | Freq: Two times a day (BID) | ORAL | 3 refills | Status: DC

## 2017-06-21 MED ORDER — POTASSIUM CHLORIDE CRYS ER 20 MEQ PO TBCR: 40.0000 meq | EXTENDED_RELEASE_TABLET | Freq: Every day | ORAL | 6 refills | Status: DC

## 2017-06-21 MED ORDER — POTASSIUM CHLORIDE CRYS ER 20 MEQ PO TBCR
40.0000 meq | EXTENDED_RELEASE_TABLET | Freq: Once | ORAL | Status: AC
  Administered 2017-06-21: 40 meq via ORAL
  Filled 2017-06-21: qty 2

## 2017-06-21 NOTE — Progress Notes (Addendum)
Advanced Heart Failure Rounding Note    Subjective:    Started on milrinone 7/13 and lasix increased due to sluggish response to diuresis  Remains on milrinone. Yesterday he continued to diurese with IV lasix . Weight down another 6 pounds.   Wants to go home. Denies SOB.   Creatinine 2.5 INR 2.4    Objective:   Weight Range: 174 lb 8 oz (79.2 kg) Body mass index is 25.04 kg/m.   Vital Signs:   Temp:  [97.6 F (36.4 C)-98 F (36.7 C)] 97.9 F (36.6 C) (07/17 0457) Pulse Rate:  [68-79] 79 (07/17 0457) Resp:  [17-18] 17 (07/17 0457) BP: (99-111)/(47-53) 100/50 (07/17 0457) SpO2:  [98 %-100 %] 99 % (07/17 0457) Weight:  [174 lb 8 oz (79.2 kg)] 174 lb 8 oz (79.2 kg) (07/17 0457) Last BM Date: 06/21/17  Weight change: Filed Weights   06/20/17 0500 06/20/17 0528 06/21/17 0457  Weight: 181 lb 12.8 oz (82.5 kg) 180 lb 12.8 oz (82 kg) 174 lb 8 oz (79.2 kg)    Intake/Output:   Intake/Output Summary (Last 24 hours) at 06/21/17 0817 Last data filed at 06/21/17 0501  Gross per 24 hour  Intake             1080 ml  Output             3910 ml  Net            -2830 ml      Physical Exam    General:  Well appearing. No resp difficulty. Sitting in the chair HEENT: normal Neck: supple.JVP 5-6. Carotids 2+ bilat; no bruits. No lymphadenopathy or thryomegaly appreciated. Cor: PMI nondisplaced. Regular rate & rhythm. No rubs, gallops or murmurs. Lungs: clear Abdomen: soft, nontender, nondistended. No hepatosplenomegaly. No bruits or masses. Good bowel sounds. Extremities: no cyanosis, clubbing, rash, trace [name] LLE edema Neuro: alert & orientedx3, cranial nerves grossly intact. moves all 4 extremities w/o difficulty. Affect pleasant   Telemetry   AF 70-80s personally reviewed.     EKG    N/A  Labs    CBC  Recent Labs  06/20/17 0332 06/21/17 0221  WBC 5.7 5.7  HGB 9.4* 10.1*  HCT 29.3* 31.9*  MCV 97.7 97.0  PLT 123* 270*   Basic Metabolic  Panel  Recent Labs  06/20/17 0332 06/21/17 0221  NA 133* 131*  K 3.6 3.4*  CL 96* 93*  CO2 26 28  GLUCOSE 149* 142*  BUN 57* 54*  CREATININE 2.57* 2.49*  CALCIUM 8.9 9.3   Liver Function Tests No results for input(s): AST, ALT, ALKPHOS, BILITOT, PROT, ALBUMIN in the last 72 hours. No results for input(s): LIPASE, AMYLASE in the last 72 hours. Cardiac Enzymes No results for input(s): CKTOTAL, CKMB, CKMBINDEX, TROPONINI in the last 72 hours.  BNP: BNP (last 3 results)  Recent Labs  06/13/17 1205  BNP 1,151.0*    ProBNP (last 3 results) No results for input(s): PROBNP in the last 8760 hours.   D-Dimer No results for input(s): DDIMER in the last 72 hours. Hemoglobin A1C No results for input(s): HGBA1C in the last 72 hours. Fasting Lipid Panel No results for input(s): CHOL, HDL, LDLCALC, TRIG, CHOLHDL, LDLDIRECT in the last 72 hours. Thyroid Function Tests No results for input(s): TSH, T4TOTAL, T3FREE, THYROIDAB in the last 72 hours.  Invalid input(s): FREET3  Other results:   Imaging    No results found.   Medications:     Scheduled Medications: .  atorvastatin  40 mg Oral QHS  . insulin aspart  0-5 Units Subcutaneous QHS  . insulin aspart  0-9 Units Subcutaneous TID WC  . insulin aspart  5 Units Subcutaneous TID WC  . insulin glargine  15 Units Subcutaneous Q2200  . levothyroxine  25 mcg Oral QAC breakfast  . pantoprazole  40 mg Oral Daily  . potassium chloride SA  40 mEq Oral Daily  . potassium chloride  40 mEq Oral Once  . sodium chloride flush  3 mL Intravenous Q12H  . warfarin  1 mg Oral Once per day on Tue Thu  . warfarin  2 mg Oral Once per day on Sun Mon Wed Fri Sat  . Warfarin - Pharmacist Dosing Inpatient   Does not apply Q24H    Infusions: . sodium chloride    . milrinone 0.125 mcg/kg/min (06/21/17 0501)    PRN Medications: sodium chloride, sodium chloride flush    Patient Profile   [name] is a 61 year old with a history of  DMII, [place] 2016, rectal cancer, CKD Stage IV, chronic A fib, S/P AVN ablation, PAF, CAD BMS LAD 2011 BMS LAD 2009, OSA, chronic systolic heart failure, St Jude BiV ICD admitted to [facility] with increased lower extremity edema despite increasing home diuretic regimen to torsemide 80 mg in am and 40 mg in pm.  Transferred to [facility] on 7/13 for further management of HF and AKI.    Assessment/Plan   1. 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 Repeat ECHO .  - Admitted with 25 pounds of volume overload and AKI (baseline weight about 175) - Lasix increased and milrinone added 7/13. Weight down 18 pounds. Creatinine improved from admit/stable overnight Volume status. Restart torsemide 80 mg in am and  40 mg in pm.  -  Renal function stable     2. Chronic A fib- Rate controlled.Restart metoprolol.  - On coumadin. INR 2.50 this am.   3. Acute on CKD Stage IV - creatinine baseline 2.8-3.0. Last year he required short term dialysis. Has RUE AVF.   -Creatinine peaked at 2.9. Todays creatinine down to 2.4.    4. DMII - on SSI + lantus.  - CBGs 16-170  5. CAD  - No s/s of ischemia  - BMS 2001 2009 LAD. On statin.   6. H/O Rectal CA with diverting colostomy  - Stable  7. Hypokalemia - K 3.4     Home today. He has follow up with [facility] tomorrow.   Length of Stay: [place], NP  06/21/2017, 8:17 AM  Advanced Heart Failure Team Pager [phone] (M-F; 7a - 4p)  Please contact [facility] for night-coverage after hours (4p -7a ) and weekends on [email or web address]  Patient seen with NP, agree with the above note.  Volume status much improved.  Creatinine stable.  He has diuresed well, will go back on torsemide 80 qam/40 qpm.  Can restart home Toprol XL 12.5 mg bid.  Followups scheduled, may go home today.   [name] 06/21/2017 9:54 AM

## 2017-06-21 NOTE — Progress Notes (Signed)
Pt has orders to be discharged. Discharge instructions given and pt has no additional questions at this time. Medication regimen reviewed and pt educated. Pt verbalized understanding and has no additional questions. Telemetry box removed. IV removed and site in good condition. Pt stable and waiting for transportation.  [name] RN 

## 2017-06-21 NOTE — Progress Notes (Signed)
Bath offered pt stated that he will be leaving today and he will take a shower once he gets home

## 2017-06-22 ENCOUNTER — Ambulatory Visit (INDEPENDENT_AMBULATORY_CARE_PROVIDER_SITE_OTHER): Payer: Medicare Other | Admitting: *Deleted

## 2017-06-22 DIAGNOSIS — Z7901 Long term (current) use of anticoagulants: Secondary | ICD-10-CM

## 2017-06-22 DIAGNOSIS — E876 Hypokalemia: Secondary | ICD-10-CM | POA: Diagnosis not present

## 2017-06-22 DIAGNOSIS — I480 Paroxysmal atrial fibrillation: Secondary | ICD-10-CM

## 2017-06-22 DIAGNOSIS — I255 Ischemic cardiomyopathy: Secondary | ICD-10-CM

## 2017-06-22 DIAGNOSIS — D649 Anemia, unspecified: Secondary | ICD-10-CM | POA: Diagnosis not present

## 2017-06-22 DIAGNOSIS — I482 Chronic atrial fibrillation, unspecified: Secondary | ICD-10-CM

## 2017-06-22 DIAGNOSIS — N250 Renal osteodystrophy: Secondary | ICD-10-CM | POA: Diagnosis not present

## 2017-06-22 DIAGNOSIS — N184 Chronic kidney disease, stage 4 (severe): Secondary | ICD-10-CM | POA: Diagnosis not present

## 2017-06-22 DIAGNOSIS — I4891 Unspecified atrial fibrillation: Secondary | ICD-10-CM | POA: Diagnosis not present

## 2017-06-22 DIAGNOSIS — I10 Essential (primary) hypertension: Secondary | ICD-10-CM | POA: Diagnosis not present

## 2017-06-22 LAB — POCT INR: INR: 3

## 2017-06-27 ENCOUNTER — Other Ambulatory Visit: Payer: Self-pay | Admitting: Cardiology

## 2017-06-27 ENCOUNTER — Other Ambulatory Visit: Payer: Self-pay | Admitting: Internal Medicine

## 2017-06-27 DIAGNOSIS — R600 Localized edema: Secondary | ICD-10-CM | POA: Diagnosis not present

## 2017-06-27 DIAGNOSIS — E119 Type 2 diabetes mellitus without complications: Secondary | ICD-10-CM | POA: Diagnosis not present

## 2017-06-27 DIAGNOSIS — I480 Paroxysmal atrial fibrillation: Secondary | ICD-10-CM | POA: Diagnosis not present

## 2017-06-27 DIAGNOSIS — Z6827 Body mass index (BMI) 27.0-27.9, adult: Secondary | ICD-10-CM | POA: Diagnosis not present

## 2017-06-27 MED ORDER — METOLAZONE 2.5 MG PO TABS: ORAL_TABLET | ORAL | 3 refills | Status: DC

## 2017-06-27 NOTE — Telephone Encounter (Signed)
Medication has been refilled.

## 2017-06-27 NOTE — Telephone Encounter (Signed)
Patient called requesting refill on metolazone (ZAROXOLYN) 2.5 MG tablet All prescriptions need to be sent to [place], [place]

## 2017-06-29 ENCOUNTER — Ambulatory Visit (INDEPENDENT_AMBULATORY_CARE_PROVIDER_SITE_OTHER): Payer: Medicare Other | Admitting: *Deleted

## 2017-06-29 DIAGNOSIS — I482 Chronic atrial fibrillation, unspecified: Secondary | ICD-10-CM

## 2017-06-29 DIAGNOSIS — I480 Paroxysmal atrial fibrillation: Secondary | ICD-10-CM | POA: Diagnosis not present

## 2017-06-29 DIAGNOSIS — I255 Ischemic cardiomyopathy: Secondary | ICD-10-CM

## 2017-06-29 DIAGNOSIS — Z7901 Long term (current) use of anticoagulants: Secondary | ICD-10-CM

## 2017-06-29 LAB — POCT INR: INR: 3.6

## 2017-07-05 ENCOUNTER — Other Ambulatory Visit: Payer: Self-pay

## 2017-07-05 DIAGNOSIS — K7460 Unspecified cirrhosis of liver: Secondary | ICD-10-CM

## 2017-07-07 DIAGNOSIS — Z7901 Long term (current) use of anticoagulants: Secondary | ICD-10-CM | POA: Diagnosis not present

## 2017-07-07 DIAGNOSIS — M79651 Pain in right thigh: Secondary | ICD-10-CM | POA: Diagnosis not present

## 2017-07-07 DIAGNOSIS — R600 Localized edema: Secondary | ICD-10-CM | POA: Diagnosis not present

## 2017-07-07 DIAGNOSIS — I252 Old myocardial infarction: Secondary | ICD-10-CM | POA: Diagnosis not present

## 2017-07-07 DIAGNOSIS — R609 Edema, unspecified: Secondary | ICD-10-CM | POA: Diagnosis not present

## 2017-07-07 DIAGNOSIS — M79606 Pain in leg, unspecified: Secondary | ICD-10-CM | POA: Diagnosis not present

## 2017-07-07 DIAGNOSIS — E119 Type 2 diabetes mellitus without complications: Secondary | ICD-10-CM | POA: Diagnosis not present

## 2017-07-07 DIAGNOSIS — L03115 Cellulitis of right lower limb: Secondary | ICD-10-CM | POA: Diagnosis not present

## 2017-07-07 DIAGNOSIS — M160 Bilateral primary osteoarthritis of hip: Secondary | ICD-10-CM | POA: Diagnosis not present

## 2017-07-07 DIAGNOSIS — R1030 Lower abdominal pain, unspecified: Secondary | ICD-10-CM | POA: Diagnosis not present

## 2017-07-07 DIAGNOSIS — R1031 Right lower quadrant pain: Secondary | ICD-10-CM | POA: Diagnosis not present

## 2017-07-09 ENCOUNTER — Inpatient Hospital Stay (HOSPITAL_COMMUNITY)
Admission: EM | Admit: 2017-07-09 | Discharge: 2017-08-02 | DRG: 871 | Disposition: A | Discharge status: Rehabilitation Facility | Payer: Medicare Other | Attending: Internal Medicine | Admitting: Internal Medicine

## 2017-07-09 ENCOUNTER — Emergency Department (HOSPITAL_COMMUNITY): Payer: Medicare Other

## 2017-07-09 DIAGNOSIS — Z8546 Personal history of malignant neoplasm of prostate: Secondary | ICD-10-CM

## 2017-07-09 DIAGNOSIS — K7460 Unspecified cirrhosis of liver: Secondary | ICD-10-CM | POA: Diagnosis present

## 2017-07-09 DIAGNOSIS — L97509 Non-pressure chronic ulcer of other part of unspecified foot with unspecified severity: Secondary | ICD-10-CM

## 2017-07-09 DIAGNOSIS — Z7901 Long term (current) use of anticoagulants: Secondary | ICD-10-CM

## 2017-07-09 DIAGNOSIS — Z9989 Dependence on other enabling machines and devices: Secondary | ICD-10-CM

## 2017-07-09 DIAGNOSIS — I132 Hypertensive heart and chronic kidney disease with heart failure and with stage 5 chronic kidney disease, or end stage renal disease: Secondary | ICD-10-CM | POA: Diagnosis present

## 2017-07-09 DIAGNOSIS — I340 Nonrheumatic mitral (valve) insufficiency: Secondary | ICD-10-CM | POA: Diagnosis present

## 2017-07-09 DIAGNOSIS — R64 Cachexia: Secondary | ICD-10-CM | POA: Diagnosis present

## 2017-07-09 DIAGNOSIS — I071 Rheumatic tricuspid insufficiency: Secondary | ICD-10-CM | POA: Diagnosis present

## 2017-07-09 DIAGNOSIS — I351 Nonrheumatic aortic (valve) insufficiency: Secondary | ICD-10-CM | POA: Diagnosis present

## 2017-07-09 DIAGNOSIS — J9600 Acute respiratory failure, unspecified whether with hypoxia or hypercapnia: Secondary | ICD-10-CM | POA: Diagnosis not present

## 2017-07-09 DIAGNOSIS — D689 Coagulation defect, unspecified: Secondary | ICD-10-CM | POA: Diagnosis present

## 2017-07-09 DIAGNOSIS — R061 Stridor: Secondary | ICD-10-CM | POA: Diagnosis not present

## 2017-07-09 DIAGNOSIS — M109 Gout, unspecified: Secondary | ICD-10-CM | POA: Diagnosis present

## 2017-07-09 DIAGNOSIS — I5043 Acute on chronic combined systolic (congestive) and diastolic (congestive) heart failure: Secondary | ICD-10-CM | POA: Diagnosis present

## 2017-07-09 DIAGNOSIS — N179 Acute kidney failure, unspecified: Secondary | ICD-10-CM | POA: Diagnosis present

## 2017-07-09 DIAGNOSIS — I480 Paroxysmal atrial fibrillation: Secondary | ICD-10-CM | POA: Diagnosis present

## 2017-07-09 DIAGNOSIS — Y95 Nosocomial condition: Secondary | ICD-10-CM | POA: Diagnosis present

## 2017-07-09 DIAGNOSIS — R6521 Severe sepsis with septic shock: Secondary | ICD-10-CM | POA: Diagnosis not present

## 2017-07-09 DIAGNOSIS — I214 Non-ST elevation (NSTEMI) myocardial infarction: Secondary | ICD-10-CM | POA: Diagnosis not present

## 2017-07-09 DIAGNOSIS — I482 Chronic atrial fibrillation, unspecified: Secondary | ICD-10-CM | POA: Diagnosis present

## 2017-07-09 DIAGNOSIS — R570 Cardiogenic shock: Secondary | ICD-10-CM | POA: Diagnosis present

## 2017-07-09 DIAGNOSIS — J189 Pneumonia, unspecified organism: Secondary | ICD-10-CM | POA: Diagnosis present

## 2017-07-09 DIAGNOSIS — E871 Hypo-osmolality and hyponatremia: Secondary | ICD-10-CM | POA: Diagnosis present

## 2017-07-09 DIAGNOSIS — I2510 Atherosclerotic heart disease of native coronary artery without angina pectoris: Secondary | ICD-10-CM | POA: Diagnosis present

## 2017-07-09 DIAGNOSIS — Z9889 Other specified postprocedural states: Secondary | ICD-10-CM

## 2017-07-09 DIAGNOSIS — I959 Hypotension, unspecified: Secondary | ICD-10-CM | POA: Diagnosis not present

## 2017-07-09 DIAGNOSIS — R0602 Shortness of breath: Secondary | ICD-10-CM | POA: Diagnosis not present

## 2017-07-09 DIAGNOSIS — Z452 Encounter for adjustment and management of vascular access device: Secondary | ICD-10-CM

## 2017-07-09 DIAGNOSIS — K219 Gastro-esophageal reflux disease without esophagitis: Secondary | ICD-10-CM | POA: Diagnosis present

## 2017-07-09 DIAGNOSIS — R945 Abnormal results of liver function studies: Secondary | ICD-10-CM | POA: Diagnosis present

## 2017-07-09 DIAGNOSIS — A4101 Sepsis due to Methicillin susceptible Staphylococcus aureus: Secondary | ICD-10-CM | POA: Diagnosis not present

## 2017-07-09 DIAGNOSIS — I50810 Right heart failure, unspecified: Secondary | ICD-10-CM | POA: Diagnosis present

## 2017-07-09 DIAGNOSIS — Z800 Family history of malignant neoplasm of digestive organs: Secondary | ICD-10-CM

## 2017-07-09 DIAGNOSIS — I252 Old myocardial infarction: Secondary | ICD-10-CM

## 2017-07-09 DIAGNOSIS — R52 Pain, unspecified: Secondary | ICD-10-CM | POA: Diagnosis not present

## 2017-07-09 DIAGNOSIS — Z95810 Presence of automatic (implantable) cardiac defibrillator: Secondary | ICD-10-CM

## 2017-07-09 DIAGNOSIS — N184 Chronic kidney disease, stage 4 (severe): Secondary | ICD-10-CM | POA: Diagnosis present

## 2017-07-09 DIAGNOSIS — N186 End stage renal disease: Secondary | ICD-10-CM | POA: Diagnosis not present

## 2017-07-09 DIAGNOSIS — J9621 Acute and chronic respiratory failure with hypoxia: Secondary | ICD-10-CM | POA: Diagnosis present

## 2017-07-09 DIAGNOSIS — A419 Sepsis, unspecified organism: Secondary | ICD-10-CM | POA: Diagnosis present

## 2017-07-09 DIAGNOSIS — R9431 Abnormal electrocardiogram [ECG] [EKG]: Secondary | ICD-10-CM | POA: Diagnosis not present

## 2017-07-09 DIAGNOSIS — Z992 Dependence on renal dialysis: Secondary | ICD-10-CM

## 2017-07-09 DIAGNOSIS — I255 Ischemic cardiomyopathy: Secondary | ICD-10-CM | POA: Diagnosis present

## 2017-07-09 DIAGNOSIS — D509 Iron deficiency anemia, unspecified: Secondary | ICD-10-CM | POA: Diagnosis present

## 2017-07-09 DIAGNOSIS — B9561 Methicillin susceptible Staphylococcus aureus infection as the cause of diseases classified elsewhere: Secondary | ICD-10-CM

## 2017-07-09 DIAGNOSIS — E872 Acidosis: Secondary | ICD-10-CM | POA: Diagnosis present

## 2017-07-09 DIAGNOSIS — D631 Anemia in chronic kidney disease: Secondary | ICD-10-CM | POA: Diagnosis present

## 2017-07-09 DIAGNOSIS — Z6829 Body mass index (BMI) 29.0-29.9, adult: Secondary | ICD-10-CM

## 2017-07-09 DIAGNOSIS — N499 Inflammatory disorder of unspecified male genital organ: Secondary | ICD-10-CM | POA: Diagnosis not present

## 2017-07-09 DIAGNOSIS — E1122 Type 2 diabetes mellitus with diabetic chronic kidney disease: Secondary | ICD-10-CM | POA: Diagnosis present

## 2017-07-09 DIAGNOSIS — Z833 Family history of diabetes mellitus: Secondary | ICD-10-CM

## 2017-07-09 DIAGNOSIS — Z85048 Personal history of other malignant neoplasm of rectum, rectosigmoid junction, and anus: Secondary | ICD-10-CM

## 2017-07-09 DIAGNOSIS — L97529 Non-pressure chronic ulcer of other part of left foot with unspecified severity: Secondary | ICD-10-CM | POA: Diagnosis not present

## 2017-07-09 DIAGNOSIS — E669 Obesity, unspecified: Secondary | ICD-10-CM | POA: Diagnosis present

## 2017-07-09 DIAGNOSIS — G4733 Obstructive sleep apnea (adult) (pediatric): Secondary | ICD-10-CM

## 2017-07-09 DIAGNOSIS — R7881 Bacteremia: Secondary | ICD-10-CM

## 2017-07-09 DIAGNOSIS — M869 Osteomyelitis, unspecified: Secondary | ICD-10-CM | POA: Diagnosis not present

## 2017-07-09 DIAGNOSIS — E785 Hyperlipidemia, unspecified: Secondary | ICD-10-CM | POA: Diagnosis present

## 2017-07-09 DIAGNOSIS — Z955 Presence of coronary angioplasty implant and graft: Secondary | ICD-10-CM

## 2017-07-09 DIAGNOSIS — I5023 Acute on chronic systolic (congestive) heart failure: Secondary | ICD-10-CM

## 2017-07-09 DIAGNOSIS — E1165 Type 2 diabetes mellitus with hyperglycemia: Secondary | ICD-10-CM | POA: Diagnosis present

## 2017-07-09 DIAGNOSIS — Z8673 Personal history of transient ischemic attack (TIA), and cerebral infarction without residual deficits: Secondary | ICD-10-CM

## 2017-07-09 DIAGNOSIS — Z8249 Family history of ischemic heart disease and other diseases of the circulatory system: Secondary | ICD-10-CM

## 2017-07-09 DIAGNOSIS — K7290 Hepatic failure, unspecified without coma: Secondary | ICD-10-CM | POA: Diagnosis present

## 2017-07-09 DIAGNOSIS — M009 Pyogenic arthritis, unspecified: Secondary | ICD-10-CM | POA: Diagnosis present

## 2017-07-09 DIAGNOSIS — Z79899 Other long term (current) drug therapy: Secondary | ICD-10-CM

## 2017-07-09 DIAGNOSIS — E1142 Type 2 diabetes mellitus with diabetic polyneuropathy: Secondary | ICD-10-CM | POA: Diagnosis present

## 2017-07-09 DIAGNOSIS — I1310 Hypertensive heart and chronic kidney disease without heart failure, with stage 1 through stage 4 chronic kidney disease, or unspecified chronic kidney disease: Secondary | ICD-10-CM

## 2017-07-09 DIAGNOSIS — Z794 Long term (current) use of insulin: Secondary | ICD-10-CM

## 2017-07-09 DIAGNOSIS — T827XXA Infection and inflammatory reaction due to other cardiac and vascular devices, implants and grafts, initial encounter: Secondary | ICD-10-CM

## 2017-07-09 DIAGNOSIS — D696 Thrombocytopenia, unspecified: Secondary | ICD-10-CM | POA: Diagnosis present

## 2017-07-09 DIAGNOSIS — I420 Dilated cardiomyopathy: Secondary | ICD-10-CM | POA: Diagnosis present

## 2017-07-09 DIAGNOSIS — Z933 Colostomy status: Secondary | ICD-10-CM

## 2017-07-09 HISTORY — DX: Unspecified cirrhosis of liver: K74.60

## 2017-07-09 LAB — CBC WITH DIFFERENTIAL/PLATELET
BASOS PCT: 0 %
Basophils Absolute: 0 10*3/uL (ref 0.0–0.1)
Eosinophils Absolute: 0 10*3/uL (ref 0.0–0.7)
Eosinophils Relative: 0 %
HEMATOCRIT: 24.9 % — AB (ref 39.0–52.0)
HEMOGLOBIN: 8.2 g/dL — AB (ref 13.0–17.0)
LYMPHS PCT: 2 %
Lymphs Abs: 0.4 10*3/uL — ABNORMAL LOW (ref 0.7–4.0)
MCH: 30.5 pg (ref 26.0–34.0)
MCHC: 32.9 g/dL (ref 30.0–36.0)
MCV: 92.6 fL (ref 78.0–100.0)
MONOS PCT: 5 %
Monocytes Absolute: 0.9 10*3/uL (ref 0.1–1.0)
NEUTROS PCT: 93 %
Neutro Abs: 16.8 10*3/uL — ABNORMAL HIGH (ref 1.7–7.7)
Platelets: 158 10*3/uL (ref 150–400)
RBC: 2.69 MIL/uL — ABNORMAL LOW (ref 4.22–5.81)
RDW: 15.3 % (ref 11.5–15.5)
WBC: 18.1 10*3/uL — ABNORMAL HIGH (ref 4.0–10.5)

## 2017-07-09 LAB — I-STAT TROPONIN, ED: TROPONIN I, POC: 0.09 ng/mL — AB (ref 0.00–0.08)

## 2017-07-09 LAB — I-STAT CHEM 8, ED
BUN: 97 mg/dL — ABNORMAL HIGH (ref 6–20)
CALCIUM ION: 0.99 mmol/L — AB (ref 1.15–1.40)
CREATININE: 3.7 mg/dL — AB (ref 0.61–1.24)
Chloride: 89 mmol/L — ABNORMAL LOW (ref 101–111)
Glucose, Bld: 280 mg/dL — ABNORMAL HIGH (ref 65–99)
HEMATOCRIT: 26 % — AB (ref 39.0–52.0)
Hemoglobin: 8.8 g/dL — ABNORMAL LOW (ref 13.0–17.0)
Potassium: 3.7 mmol/L (ref 3.5–5.1)
Sodium: 124 mmol/L — ABNORMAL LOW (ref 135–145)
TCO2: 20 mmol/L (ref 0–100)

## 2017-07-09 LAB — URINALYSIS, MICROSCOPIC (REFLEX)

## 2017-07-09 LAB — URINALYSIS, ROUTINE W REFLEX MICROSCOPIC
Bilirubin Urine: NEGATIVE
GLUCOSE, UA: 100 mg/dL — AB
KETONES UR: NEGATIVE mg/dL
NITRITE: NEGATIVE
PROTEIN: NEGATIVE mg/dL
Specific Gravity, Urine: 1.015 (ref 1.005–1.030)
pH: 5 (ref 5.0–8.0)

## 2017-07-09 LAB — COMPREHENSIVE METABOLIC PANEL
ALK PHOS: 98 U/L (ref 38–126)
ALT: 14 U/L — AB (ref 17–63)
AST: 45 U/L — ABNORMAL HIGH (ref 15–41)
Albumin: 2.2 g/dL — ABNORMAL LOW (ref 3.5–5.0)
Anion gap: 16 — ABNORMAL HIGH (ref 5–15)
BUN: 106 mg/dL — ABNORMAL HIGH (ref 6–20)
CALCIUM: 8.7 mg/dL — AB (ref 8.9–10.3)
CO2: 20 mmol/L — ABNORMAL LOW (ref 22–32)
CREATININE: 3.75 mg/dL — AB (ref 0.61–1.24)
Chloride: 88 mmol/L — ABNORMAL LOW (ref 101–111)
GFR, EST AFRICAN AMERICAN: 18 mL/min — AB (ref >60)
GFR, EST NON AFRICAN AMERICAN: 16 mL/min — AB (ref >60)
Glucose, Bld: 282 mg/dL — ABNORMAL HIGH (ref 65–99)
Potassium: 3.7 mmol/L (ref 3.5–5.1)
Sodium: 124 mmol/L — ABNORMAL LOW (ref 135–145)
Total Bilirubin: 2.5 mg/dL — ABNORMAL HIGH (ref 0.3–1.2)
Total Protein: 6.4 g/dL — ABNORMAL LOW (ref 6.5–8.1)

## 2017-07-09 LAB — I-STAT CG4 LACTIC ACID, ED: Lactic Acid, Venous: 6.19 mmol/L (ref 0.5–1.9)

## 2017-07-09 LAB — PROTIME-INR
INR: 1.89
PROTHROMBIN TIME: 22 s — AB (ref 11.4–15.2)

## 2017-07-09 LAB — MAGNESIUM: Magnesium: 2.3 mg/dL (ref 1.7–2.4)

## 2017-07-09 LAB — BRAIN NATRIURETIC PEPTIDE: B Natriuretic Peptide: 1553.9 pg/mL — ABNORMAL HIGH (ref 0.0–100.0)

## 2017-07-09 MED ORDER — ALBUTEROL SULFATE (2.5 MG/3ML) 0.083% IN NEBU: 5.0000 mg | INHALATION_SOLUTION | Freq: Once | RESPIRATORY_TRACT | Status: DC

## 2017-07-09 MED ORDER — SODIUM CHLORIDE 0.9 % IV BOLUS (SEPSIS)
1000.0000 mL | Freq: Once | INTRAVENOUS | Status: AC
  Administered 2017-07-09: 1000 mL via INTRAVENOUS

## 2017-07-09 MED ORDER — PIPERACILLIN-TAZOBACTAM 3.375 G IVPB
3.3750 g | Freq: Three times a day (TID) | INTRAVENOUS | Status: DC
  Administered 2017-07-10 (×2): 3.375 g via INTRAVENOUS
  Filled 2017-07-09 (×3): qty 50

## 2017-07-09 MED ORDER — VANCOMYCIN HCL IN DEXTROSE 750-5 MG/150ML-% IV SOLN
750.0000 mg | INTRAVENOUS | Status: DC
  Filled 2017-07-09: qty 150

## 2017-07-09 MED ORDER — VANCOMYCIN HCL IN DEXTROSE 1-5 GM/200ML-% IV SOLN
1000.0000 mg | Freq: Once | INTRAVENOUS | Status: AC
  Administered 2017-07-09: 1000 mg via INTRAVENOUS
  Filled 2017-07-09: qty 200

## 2017-07-09 MED ORDER — PIPERACILLIN-TAZOBACTAM 3.375 G IVPB 30 MIN
3.3750 g | Freq: Once | INTRAVENOUS | Status: AC
  Administered 2017-07-09: 3.375 g via INTRAVENOUS
  Filled 2017-07-09: qty 50

## 2017-07-09 MED ORDER — SODIUM CHLORIDE 0.9 % IV BOLUS (SEPSIS)
500.0000 mL | Freq: Once | INTRAVENOUS | Status: AC
  Administered 2017-07-09: 500 mL via INTRAVENOUS

## 2017-07-09 NOTE — ED Notes (Signed)
Verified with Mizera MD about the amount of fluid and the delay because of patients condition

## 2017-07-09 NOTE — ED Provider Notes (Signed)
[facility] DEPT Provider Note   CSN: [id number] Arrival date & time: 07/09/17  2124     History   Chief Complaint Chief Complaint  Patient presents with  . Shortness of Breath  . Leg Swelling     HPI [name] is a 61 y.o. Male with a history of systolic and diastolic CHF, St Jude BiV ICD, CAD s/p BMS to LAD 2011 LAD 2009, CAD, pAF/flutter, CKD IV, cirrhosis, rectal cancer s/p civerting colostomy who presents with complaints of worsening shortness of breath and right leg pain. Patient admitted 7/9-7/17  to Hospitalist service and transferred to Heart Failure team for fluid overload, was diuresed with IV lasix and metolazone. Recently completed vacation (4 hr drive to beach). During trip, developed worsening right lower extremity pain and difficulty with ambulation. Was seen at ED while on vacation, had ultrasound of lower extremity and radiograph of his hip, where were negative for acute findings.   Patient is poor historian - differing history on reassessment. It seems that patient was feeling back to baseline after hospital discharge in July, good enough to take a vacation this past week. His symptoms have worsened over the past week, with cough, worsening leg swelling, shortness of breath and specifically right leg pain from hip down. He reports he does still urinate (althought this is unclear to what extent), he has not required dialysis in past year.   Past Medical History:  Diagnosis Date  . Adenocarcinoma of rectum ([place])    a. 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 AICD (automatic cardioverter/defibrillator) present 2002   BI V ICD  . Anemia   . CAD (coronary artery disease)    a. BMS to LAD 2001 at [facility] b. PTCA/atherectomy ramus and BMS to LAD 2009  . CHF (congestive heart failure) ([place])   . Cholelithiasis 06/2015  . Chronic kidney disease, stage IV (severe) ([place])   . Chronic systolic heart failure ([place])   . Colostomy in place [facility])   . Dizziness    a. chronic. Admission for this  07/18/2014  . DM type 2, uncontrolled, with renal complications ([place])   . Dysrhythmia   . Essential hypertension, benign   . GERD (gastroesophageal reflux disease)   . HCAP (healthcare-associated pneumonia) 07/21/2015  . Hematuria   . History of blood transfusion    "I've had 2 units so far this year" (09/27/2015)  . HLD (hyperlipidemia)   . Ischemic cardiomyopathy    EF 18% by nuclear study 2016, multiple myocardial infarctions in past    . Myocardial infarction ([place]) 2001  . Obesity   . Orthostatic hypotension   . OSA on CPAP   . Paroxysmal atrial fibrillation (H[facility])    a. on amiodarone, digoxin and Eliquis  . PONV (postoperative nausea and vomiting)   . Prostate cancer ([place])    a. s/p seed implants with chemo and radiation  . SAH (subarachnoid hemorrhage) ([place])    post-traumatic (fall) [facility] 12/2014  . TIA (transient ischemic attack)     Patient Active Problem List   Diagnosis Date Noted  . Cardiogenic shock ([place]) 07/10/2017  . Septic shock ([place]) 07/10/2017  . Cardiorenal syndrome with renal failure   . Thrombocytopenia ([place]) 06/14/2017  . GI bleed 04/27/2017  . Portal hypertensive gastropathy ([place]) 04/27/2017  . Hypokalemia 04/27/2017  . Esophageal dysphagia 04/13/2017  . Hepatic cirrhosis ([place]) 02/24/2017  . Acute on chronic combined systolic and diastolic congestive heart failure ([place])   . Biliary disease with obstruction   . AKI (acute kidney  injury) ([place])   . Abnormal LFTs 05/10/2016  . Pain in the chest   . Right heart failure ([place]) 09/26/2015  . History of colonic polyps 09/24/2015  . Chronic atrial fibrillation ([place])   . Bleeding from colostomy stoma ([place]) 08/23/2015  . Anemia of chronic disease 07/21/2015  . Constipation 05/01/2015  . Transaminitis 05/01/2015  . NSTEMI- 04/23/15- Myoview scar- no cath 04/27/2015  . Chronic cholecystitis 04/04/2015  . Cystitis with hematuria-April 2016 04/04/2015  . Chronically elevated transaminase level-Amiodarone resumed  04/24/15 04/03/2015  . H/O ventricular fibrillation-March 2016 02/24/2015  . BiV ICD (St Jude) gen change 04/10/15 02/24/2015  . Hyperlipidemia   . Dietary noncompliance   . Orthostatic hypotension 07/19/2014  . OSA on CPAP   . GERD (gastroesophageal reflux disease) 06/05/2014  . Gallbladder polyp 03/06/2014  . DM type 2, uncontrolled, with renal complications ([place]) 07/37/1062  . Dizziness 01/26/2014  . Obesity 12/12/2013  . Chronic hypotension 12/11/2013  . Chronic kidney disease, stage IV (severe) ([place]) 12/11/2013  . Chronic a-fib ([place])   . CAD with LCX disease and stents to LAD 07/10/2013  . Long term current use of anticoagulant therapy 03/23/2013  . History of rectal cancer   . Ischemic cardiomyopathy-EF 35% 04/24/15 echo     Past Surgical History:  Procedure Laterality Date  . Abdominal and Perineal Resection of Rectum with Total Mesorectal Excision  10/04/2007  . AV FISTULA PLACEMENT Right 09/16/2015   Procedure: ARTERIOVENOUS (AV) FISTULA CREATION - BRACHIOCEPHALIC;  Surgeon: Elam Dutch, MD;  Location: [facility];  Service: Vascular;  Laterality: Right;  . BI-VENTRICULAR IMPLANTABLE CARDIOVERTER DEFIBRILLATOR  (CRT-D)  2009  . CARDIAC CATHETERIZATION  08/2001; 2009   ; [name] 07/10/2013  . CARDIAC DEFIBRILLATOR PLACEMENT  2002  . CARDIAC DEFIBRILLATOR PLACEMENT  2009   Upgraded to a BiV ICD  . COLONOSCOPY  09/14/2011   Dr. Gala Romney: via colostomy, Single pedunculated benign inflammatory polyp. Due for surveillance Oct 2015  . COLONOSCOPY N/A 07/02/2014   Procedure: COLONOSCOPY;  Surgeon: Daneil Dolin, MD;  [facility];  Service: Endoscopy;  Laterality: N/A;  7:30 / COLONOSCOPY THRU COLOSTOMY  . COLONOSCOPY N/A 12/11/2014   Dr. Gala Romney via colostomy. Normal. Repeat in 2021.   Marland Kitchen COLONOSCOPY N/A 08/24/2015   Dr. Havery Moros: diminutive polyp not removed, stoma site of bleeding  . COLOSTOMY  2008  . CORONARY ANGIOPLASTY WITH STENT PLACEMENT  2001; ~ 2006   "1 + 1"   .  ELECTROPHYSIOLOGIC STUDY N/A 08/28/2015   Procedure: AV Node Ablation;  Surgeon: Will Meredith Leeds, MD;  Location: [facility] CV LAB;  Service: Cardiovascular;  Laterality: N/A;  . EP IMPLANTABLE DEVICE N/A 04/10/2015   Procedure: Ppm/Biv Ppm Generator Changeout;  Surgeon: Evans Lance, MD;  Location: [facility] INVASIVE CV LAB CUPID;  Service: Cardiovascular;  Laterality: N/A;  . ERCP N/A 06/11/2016   Procedure: ENDOSCOPIC RETROGRADE CHOLANGIOPANCREATOGRAPHY (ERCP);  Surgeon: Teena Irani, MD;  Location: [facility] ENDOSCOPY;  Service: Endoscopy;  Laterality: N/A;  . ERCP N/A 01/26/2017   Procedure: ENDOSCOPIC RETROGRADE CHOLANGIOPANCREATOGRAPHY (ERCP);  Surgeon: Teena Irani, MD;  Location: [facility] ENDOSCOPY;  Service: Endoscopy;  Laterality: N/A;  . ERCP  06/2016   Dr. Amedeo Plenty: duodenal fistula, dilated bile duct but no obvious stones, s/p sphincterotomy and stent placement  . ERCP  01/2017   Dr. Amedeo Plenty: CBD stones, s/p sphincterotomy, balloon extraction, stent removal  . ESOPHAGOGASTRODUODENOSCOPY N/A 07/02/2014   Procedure: ESOPHAGOGASTRODUODENOSCOPY (EGD);  Surgeon: Daneil Dolin, MD;  [facility];  Service: Endoscopy;  Laterality: N/A;  7:30  . ESOPHAGOGASTRODUODENOSCOPY N/A 12/11/2014   [id number] EGD  . ESOPHAGOGASTRODUODENOSCOPY (EGD) WITH PROPOFOL N/A 04/18/2017   Procedure: ESOPHAGOGASTRODUODENOSCOPY (EGD) WITH PROPOFOL;  Surgeon: Daneil Dolin, MD;  [facility];  Service: Endoscopy;  Laterality: N/A;  2:30pm  . GASTROINTESTINAL STENT REMOVAL N/A 01/26/2017   Procedure: GASTROINTESTINAL STENT REMOVAL;  Surgeon: Teena Irani, MD;  Location: [facility];  Service: Endoscopy;  Laterality: N/A;  . GIVENS CAPSULE STUDY N/A 07/23/2015   Dr. Michail Sermon: small non-bleeding AVM, mild gastritis  . LEFT HEART CATHETERIZATION WITH CORONARY ANGIOGRAM N/A 07/13/2013   Procedure: LEFT HEART CATHETERIZATION WITH CORONARY ANGIOGRAM;  Surgeon: Lorretta Harp, MD;  Location: [facility] CATH LAB;  Service:  Cardiovascular;  Laterality: N/A;  . Venia Minks DILATION N/A 07/02/2014   Procedure: Venia Minks DILATION;  Surgeon: Daneil Dolin, MD;  [facility];  Service: Endoscopy;  Laterality: N/A;  7:30  . MALONEY DILATION N/A 04/18/2017   Procedure: Venia Minks DILATION;  Surgeon: Daneil Dolin, MD;  [facility];  Service: Endoscopy;  Laterality: N/A;  . PORTACATH PLACEMENT  06/2007   "removed ~ 1 yr later"  . RIGHT HEART CATHETERIZATION N/A 02/24/2015   Procedure: RIGHT HEART CATH;  Surgeon: Jolaine Artist, MD;  Location: [facility] CATH LAB;  Service: Cardiovascular;  Laterality: N/A;  . SAVORY DILATION N/A 07/02/2014   Procedure: SAVORY DILATION;  Surgeon: Daneil Dolin, MD;  [facility];  Service: Endoscopy;  Laterality: N/A;  7:30       Home Medications    Prior to Admission medications   Medication Sig Start Date End Date Taking? Authorizing Provider  atorvastatin (LIPITOR) 40 MG tablet Take 1 tablet (40 mg total) by mouth daily. Patient taking differently: Take 40 mg by mouth at bedtime.  02/15/17 07/10/17 Yes Bensimhon, Shaune Pascal, MD  Coenzyme Q10 (COQ10) 50 MG CAPS Take 1 capsule by mouth daily.   Yes [provider]  collagenase (SANTYL) ointment Apply topically daily. Patient taking differently: Apply topically every other day. At night 04/28/17  Yes Lavina Hamman, MD  ferrous sulfate 325 (65 FE) MG tablet Take 1 tablet (325 mg total) by mouth 3 (three) times daily with meals. 04/28/17  Yes Berle Mull M, MD  glucose blood test strip Use to test blood sugar 3 times daily 03/23/17  Yes Elayne Snare, MD  insulin aspart (NOVOLOG FLEXPEN) 100 UNIT/ML FlexPen 5 units before each meal Patient taking differently: Inject 6 Units into the skin 3 (three) times daily with meals. 5 units before each meal 03/23/17  Yes Elayne Snare, MD  metolazone (ZAROXOLYN) 2.5 MG tablet take 1 tablet by mouth weekly if needed for SWELLING 06/27/17  Yes Branch, Alphonse Guild, MD  metoprolol  succinate (TOPROL-XL) 25 MG 24 hr tablet Take 0.5 tablets (12.5 mg total) by mouth 2 (two) times daily. 06/22/17  Yes Clegg, Amy D, NP  nitroGLYCERIN (NITROSTAT) 0.4 MG SL tablet Place 1 tablet (0.4 mg total) under the tongue every 5 (five) minutes as needed for chest pain. 06/10/17 09/08/17 Yes BranchAlphonse Guild, MD  pantoprazole (PROTONIX) 40 MG tablet Take 1 tablet (40 mg total) by mouth daily. 05/07/16  Yes Shirley Friar, PA-C  polyethylene glycol [facility] / GLYCOLAX) packet Take 17 g by mouth daily as needed for mild constipation. 04/28/17  Yes Lavina Hamman, MD  potassium chloride SA (K-DUR,KLOR-CON) 20 MEQ tablet Take 2 tablets (40 mEq total) by mouth  daily. Patient taking differently: Take 20-40 mEq by mouth daily. Only takes 2 tablets when taking metolazone 06/21/17  Yes Clegg, Amy D, NP  PROAIR RESPICLICK 785 (90 BASE) MCG/ACT AEPB Inhale 1 puff into the lungs every 6 (six) hours as needed (for breathing).  08/15/15  Yes [provider]  torsemide (DEMADEX) 20 MG tablet TAKE 4 TABLETS IN THE MORNING AND 2 TABLET IN THE EVENING 06/21/17  Yes Clegg, Amy D, NP  warfarin (COUMADIN) 2 MG tablet Take 1 tablet daily except 1/2 tablet on Tuesdays and Fridays Patient taking differently: Take 1 tablet daily except 1/2 tablet on Tuesdays and Thursdays 05/26/17  Yes Bensimhon, Shaune Pascal, MD  Insulin Glargine (LANTUS SOLOSTAR) 100 UNIT/ML Solostar Pen Inject 15 Units into the skin daily at 10 pm. Patient not taking: Reported on 06/13/2017 04/13/17   Elayne Snare, MD    Family History Family History  Problem Relation Age of Onset  . Colon cancer Mother 84  . Coronary artery disease Father   . Colon cancer Sister 76  . Diabetes Sister   . Colon cancer Other        2 cousins, succumbed to illness    Social History Social History  Substance Use Topics  . Smoking status: Never Smoker  . Smokeless tobacco: Never Used  . Alcohol use No     Comment: Former user 45 years ago     Allergies    Patient has no known allergies.   Review of Systems Review of Systems  Constitutional: Positive for chills. Negative for fever.  HENT: Negative for ear pain and sore throat.   Eyes: Negative for pain and visual disturbance.  Respiratory: Positive for cough and shortness of breath.   Cardiovascular: Positive for leg swelling. Negative for chest pain and palpitations.  Gastrointestinal: Negative for abdominal pain and vomiting.  Genitourinary: Negative for dysuria and hematuria.  Musculoskeletal: Positive for gait problem. Negative for arthralgias and back pain.       Right leg pain  Skin: Negative for color change and rash.  Neurological: Negative for seizures and syncope.  All other systems reviewed and are negative.    Physical Exam Updated Vital Signs BP (!) 112/45   Pulse 73   Temp 97.6 F (36.4 C) (Oral)   Resp 19   Ht 5\' 10"  (1.778 m)   Wt 90.7 kg (199 lb 15.3 oz)   SpO2 95%   BMI 28.69 kg/m   Physical Exam  Constitutional: He is oriented to person, place, and time. He appears distressed.  Jaundiced, chronically ill appearing  HENT:  Head: Normocephalic and atraumatic.  Eyes: Conjunctivae are normal. Scleral icterus is present.  Neck: Neck supple.  Cardiovascular: Normal rate and regular rhythm.   No murmur heard. Pulmonary/Chest: Effort normal. No respiratory distress. He has rales.  Abdominal: Soft. There is no tenderness.  Musculoskeletal: He exhibits edema (anasarca ).  Neurological: He is alert and oriented to person, place, and time. He displays normal reflexes. No cranial nerve deficit or sensory deficit. He exhibits normal muscle tone.  Skin: Skin is warm and dry. Rash (superficial ulcerations to bilateral lower extremities) noted.  Psychiatric: He has a normal mood and affect.  Nursing note and vitals reviewed.    ED Treatments / Results  Labs (all labs ordered are listed, but only abnormal results are displayed) Labs Reviewed  BLOOD CULTURE ID  PANEL (REFLEXED) - Abnormal; Notable for the following:       Result Value   Staphylococcus  species DETECTED (*)    Staphylococcus aureus DETECTED (*)    All other components within normal limits  CBC WITH DIFFERENTIAL/PLATELET - Abnormal; Notable for the following:    WBC 18.1 (*)    RBC 2.69 (*)    Hemoglobin 8.2 (*)    HCT 24.9 (*)    Neutro Abs 16.8 (*)    Lymphs Abs 0.4 (*)    All other components within normal limits  COMPREHENSIVE METABOLIC PANEL - Abnormal; Notable for the following:    Sodium 124 (*)    Chloride 88 (*)    CO2 20 (*)    Glucose, Bld 282 (*)    BUN 106 (*)    Creatinine, Ser 3.75 (*)    Calcium 8.7 (*)    Total Protein 6.4 (*)    Albumin 2.2 (*)    AST 45 (*)    ALT 14 (*)    Total Bilirubin 2.5 (*)    GFR calc non Af Amer 16 (*)    GFR calc Af Amer 18 (*)    Anion gap 16 (*)    All other components within normal limits  PROTIME-INR - Abnormal; Notable for the following:    Prothrombin Time 22.0 (*)    All other components within normal limits  BRAIN NATRIURETIC PEPTIDE - Abnormal; Notable for the following:    B Natriuretic Peptide 1,553.9 (*)    All other components within normal limits  URINALYSIS, ROUTINE W REFLEX MICROSCOPIC - Abnormal; Notable for the following:    APPearance HAZY (*)    Glucose, UA 100 (*)    Hgb urine dipstick TRACE (*)    Leukocytes, UA TRACE (*)    All other components within normal limits  URINALYSIS, MICROSCOPIC (REFLEX) - Abnormal; Notable for the following:    Bacteria, UA RARE (*)    Squamous Epithelial / LPF 0-5 (*)    All other components within normal limits  TROPONIN I - Abnormal; Notable for the following:    Troponin I 0.11 (*)    All other components within normal limits  TROPONIN I - Abnormal; Notable for the following:    Troponin I 1.05 (*)    All other components within normal limits  TROPONIN I - Abnormal; Notable for the following:    Troponin I 41.84 (*)    All other components within normal  limits  LACTIC ACID, PLASMA - Abnormal; Notable for the following:    Lactic Acid, Venous 5.4 (*)    All other components within normal limits  LACTIC ACID, PLASMA - Abnormal; Notable for the following:    Lactic Acid, Venous 4.7 (*)    All other components within normal limits  URIC ACID - Abnormal; Notable for the following:    Uric Acid, Serum 15.1 (*)    All other components within normal limits  CBC WITH DIFFERENTIAL/PLATELET - Abnormal; Notable for the following:    WBC 28.0 (*)    RBC 2.72 (*)    Hemoglobin 8.3 (*)    HCT 25.2 (*)    Neutro Abs 26.0 (*)    Lymphs Abs 0.6 (*)    Monocytes Absolute 1.4 (*)    All other components within normal limits  COMPREHENSIVE METABOLIC PANEL - Abnormal; Notable for the following:    Sodium 126 (*)    Chloride 91 (*)    CO2 16 (*)    Glucose, Bld 252 (*)    BUN 104 (*)    Creatinine, Ser 3.68 (*)  Calcium 8.3 (*)    Total Protein 6.4 (*)    Albumin 2.1 (*)    AST 47 (*)    ALT 16 (*)    Total Bilirubin 2.8 (*)    GFR calc non Af Amer 16 (*)    GFR calc Af Amer 19 (*)    Anion gap 19 (*)    All other components within normal limits  LACTIC ACID, PLASMA - Abnormal; Notable for the following:    Lactic Acid, Venous 3.9 (*)    All other components within normal limits  BASIC METABOLIC PANEL - Abnormal; Notable for the following:    Sodium 124 (*)    Chloride 89 (*)    CO2 20 (*)    Glucose, Bld 268 (*)    BUN 106 (*)    Creatinine, Ser 3.68 (*)    Calcium 8.4 (*)    GFR calc non Af Amer 16 (*)    GFR calc Af Amer 19 (*)    All other components within normal limits  COOXEMETRY PANEL - Abnormal; Notable for the following:    Total hemoglobin 8.2 (*)    Carboxyhemoglobin 1.9 (*)    All other components within normal limits  PROTIME-INR - Abnormal; Notable for the following:    Prothrombin Time 24.1 (*)    All other components within normal limits  GLUCOSE, CAPILLARY - Abnormal; Notable for the following:     Glucose-Capillary 246 (*)    All other components within normal limits  GLUCOSE, CAPILLARY - Abnormal; Notable for the following:    Glucose-Capillary 241 (*)    All other components within normal limits  GLUCOSE, CAPILLARY - Abnormal; Notable for the following:    Glucose-Capillary 244 (*)    All other components within normal limits  GLUCOSE, CAPILLARY - Abnormal; Notable for the following:    Glucose-Capillary 264 (*)    All other components within normal limits  I-STAT CHEM 8, ED - Abnormal; Notable for the following:    Sodium 124 (*)    Chloride 89 (*)    BUN 97 (*)    Creatinine, Ser 3.70 (*)    Glucose, Bld 280 (*)    Calcium, Ion 0.99 (*)    Hemoglobin 8.8 (*)    HCT 26.0 (*)    All other components within normal limits  I-STAT CG4 LACTIC ACID, ED - Abnormal; Notable for the following:    Lactic Acid, Venous 6.19 (*)    All other components within normal limits  I-STAT TROPONIN, ED - Abnormal; Notable for the following:    Troponin i, poc 0.09 (*)    All other components within normal limits  I-STAT CG4 LACTIC ACID, ED - Abnormal; Notable for the following:    Lactic Acid, Venous 5.31 (*)    All other components within normal limits  CULTURE, BLOOD (ROUTINE X 2)  CULTURE, BLOOD (ROUTINE X 2)  MRSA PCR SCREENING  URINE CULTURE  MAGNESIUM  PHOSPHORUS  OSMOLALITY, URINE  SODIUM, URINE, RANDOM  PROCALCITONIN  PROCALCITONIN  BASIC METABOLIC PANEL  MAGNESIUM  CBC  LACTIC ACID, PLASMA  BLOOD GAS, ARTERIAL  COOXEMETRY PANEL  PROTIME-INR  TYPE AND SCREEN    EKG  EKG Interpretation  Date/Time:  Saturday July 09 2017 21:30:50 EDT Ventricular Rate:  72 PR Interval:    QRS Duration: 136 QT Interval:  491 QTC Calculation: 538 R Axis:   164 Text Interpretation:  Atrial fibrillation Ventricular tachycardia, unsustained Nonspecific intraventricular conduction delay Anterolateral infarct,  age indeterminate Confirmed by Pryor Curia [phone]) on 07/10/2017 10:06:51  AM       Radiology Ct Abdomen Pelvis Wo Contrast  Result Date: 07/10/2017 CLINICAL DATA:  Abdominal pain. Swelling. Assess for diverticulitis, hematoma or abscess. History of cirrhosis, diabetes, colostomy and rectal cancer, prostate cancer, anemia. EXAM: CT ABDOMEN AND PELVIS WITHOUT CONTRAST TECHNIQUE: Multidetector CT imaging of the abdomen and pelvis was performed following the standard protocol without IV contrast. COMPARISON:  CT abdomen and pelvis May 05, 2017 FINDINGS: LOWER CHEST: Moderate RIGHT pleural effusion, small LEFT pleural effusion. The heart is mildly enlarged. Severe coronary artery calcifications and/or stents. Cardiac pacemaker wires in place. Bilateral lower lobe atelectasis. Punctate granulomas RIGHT lower lobe. HEPATOBILIARY: Mildly nodular liver contour compatible with history of cirrhosis. Subcentimeter gallstones, contracted gallbladder. PANCREAS: Normal. No intraperitoneal free air. SPLEEN: Normal. ADRENALS/URINARY TRACT: Kidneys are orthotopic, demonstrating normal size and morphology. 3 mm LEFT upper pole, 3 mm RIGHT interpolar nephrolithiasis. Additional punctate nephrolithiasis. No hydronephrosis; limited assessment for renal masses on this nonenhanced examination. The unopacified ureters are normal in course and caliber. Urinary bladder is decompressed containing a Foley catheter. Normal adrenal glands. STOMACH/BOWEL: Similarly thickened rectum with presacral soft tissue stranding. The stomach, small bowel are normal in course and caliber without inflammatory changes, sensitivity decreased by lack of enteric contrast. LEFT lower quadrant diverting colostomy. Normal appendix. VASCULAR/LYMPHATIC: Aortoiliac vessels are normal in course and caliber. Moderate aortic atherosclerosis. No lymphadenopathy by CT size criteria. REPRODUCTIVE: Prostate brachia therapy seeds. OTHER: Moderate volume ascites. MUSCULOSKELETAL: Anasarca. Sacroiliac ankylosis. Anterior abdominal wall scarring.  Similar sacral sclerosis pre ring post radiation change. IMPRESSION: 1. Cirrhosis.  Moderate ascites.  Anasarca. 2. Diverting colostomy without bowel obstruction. Similar thickened rectum compatible with treatment related changes. 3. Mild cardiomegaly. Moderate RIGHT and small LEFT pleural effusions. Aortic Atherosclerosis (ICD10-I70.0). Electronically Signed   By: Elon Alas M.D.   On: 07/10/2017 19:42   Dg Chest Port 1 View  Result Date: 07/10/2017 CLINICAL DATA:  61 year old male central line placement. EXAM: PORTABLE CHEST 1 VIEW COMPARISON:  07/09/2017 and earlier. FINDINGS: Portable AP semi upright view at 1601 hours. Right IJ approach dual lumen central venous catheter placed, and courses along side the left subclavian approach cardiac AICD leads. Catheter tips are probably at the level of the carina. No pneumothorax. Continued veiling opacity in the right lung. Stable cardiac size and mediastinal contours. Visualized tracheal air column is within normal limits. Stable and satisfactory left lung ventilation. IMPRESSION: 1. Dual-lumen right IJ central catheter tips partially obscured by the AICD leads but at the SVC level. 2. Continued right pleural effusion. No new cardiopulmonary abnormality. Electronically Signed   By: Genevie Ann M.D.   On: 07/10/2017 16:16   Dg Chest Portable 1 View  Result Date: 07/09/2017 CLINICAL DATA:  61 year old male with shortness of breath. EXAM: PORTABLE CHEST 1 VIEW COMPARISON:  Chest radiograph dated 06/13/2017 FINDINGS: There is a small right pleural effusion with right lung base airspace density which may represent atelectasis versus infiltrate. Overall the size of the pleural effusion and right lung base density appears relatively similar to prior radiograph. The left lung is clear. There is no pneumothorax. Stable cardiac silhouette. Left pectoral AICD device. No acute osseous pathology. IMPRESSION: Right pleural effusion and right lung base atelectasis versus  infiltrate. Clinical correlation is recommended. Electronically Signed   By: Anner Crete M.D.   On: 07/09/2017 23:11   Dg Hip Unilat With Pelvis 2-3 Views Right  Result Date: 07/10/2017 CLINICAL DATA:  61 year old male with right hip pain. No known injury. EXAM: DG HIP (WITH OR WITHOUT PELVIS) 2-3V RIGHT COMPARISON:  CT of the abdomen pelvis dated 05/05/2016 FINDINGS: There is no acute fracture or dislocation. The bones are mildly osteopenic. There is mild osteoarthritic changes of the hips. Multiple metallic biopsy clips noted in the region of the prostate gland. The soft tissues appear unremarkable with IMPRESSION: No acute fracture or dislocation. Mild arthritic changes. Electronically Signed   By: Anner Crete M.D.   On: 07/10/2017 02:42    Procedures Procedures (including critical care time)  Medications Ordered in ED Medications  0.9 %  sodium chloride infusion (250 mLs Intravenous Rate/Dose Verify 07/10/17 1900)  colchicine tablet 0.3 mg (0 mg Oral Duplicate 05/14/66 8938)  fentaNYL (SUBLIMAZE) injection 25 mcg (25 mcg Intravenous Given 07/10/17 1836)  insulin aspart (novoLOG) injection 0-9 Units (3 Units Subcutaneous Given 07/10/17 1749)  atorvastatin (LIPITOR) tablet 40 mg (40 mg Oral Given 07/10/17 1741)  Warfarin - Pharmacist Dosing Inpatient ( Does not apply Given 07/10/17 1800)  perflutren lipid microspheres (DEFINITY) IV suspension (4 mLs Intravenous Given 07/10/17 1042)  heparin injection 1,000-6,000 Units (not administered)  norepinephrine (LEVOPHED) 16 mg in dextrose 5 % 250 mL (0.064 mg/mL) infusion (8 mcg/min Intravenous Rate/Dose Verify 07/11/17 0000)  rifampin (RIFADIN) 300 mg in sodium chloride 0.9 % 100 mL IVPB (0 mg Intravenous Stopped 07/10/17 1902)  ceFAZolin (ANCEF) IVPB 2g/100 mL premix (0 g Intravenous Stopped 07/10/17 1812)  furosemide (LASIX) 250 mg in dextrose 5 % 250 mL (1 mg/mL) infusion (10 mg/hr Intravenous Rate/Dose Verify 07/11/17 0000)  sodium chloride 0.9 % bolus  1,000 mL (0 mLs Intravenous Stopped 07/10/17 0023)  sodium chloride 0.9 % bolus 1,000 mL (0 mLs Intravenous Stopped 07/10/17 0023)  vancomycin (VANCOCIN) IVPB 1000 mg/200 mL premix (0 mg Intravenous Stopped 07/09/17 2341)  piperacillin-tazobactam (ZOSYN) IVPB 3.375 g (0 g Intravenous Stopped 07/09/17 2311)  sodium chloride 0.9 % bolus 500 mL (0 mLs Intravenous Stopped 07/10/17 0023)  fentaNYL (SUBLIMAZE) injection 25 mcg (25 mcg Intravenous Given 07/10/17 0050)  aspirin chewable tablet 324 mg (324 mg Oral Given 07/10/17 0419)    Or  aspirin suppository 300 mg ( Rectal See Alternative 07/10/17 0419)  furosemide (LASIX) injection 40 mg (40 mg Intravenous Given 07/10/17 0318)  furosemide (LASIX) injection 40 mg (40 mg Intravenous Given 07/10/17 1037)  warfarin (COUMADIN) tablet 3 mg (3 mg Oral Given 07/10/17 1741)     Initial Impression / Assessment and Plan / ED Course  I have reviewed the triage vital signs and the nursing notes.  Pertinent labs & imaging results that were available during my care of the patient were reviewed by me and considered in my medical decision making (see chart for details).     Patient arrived hypotensive, ill appearing. No respiratory distress, saturating well on room air. Exam otherwise as above.  Undifferentiated shock - concern for septic shock given recent cough, elevated WBC to 18, lactate 6, CXR with right lung infiltrate and effusion. Code sepsis initiated, 30 cc/kg IVF resuscitation, and vanc/zosyn started after blood cultures.   Also noted lab abnormalities of Na 124, Hgb 8.8 (down from 10.1), troponin 0.09.   I do suspect that there is some component of cardiogenic shock. Given his fluid resuscitation, he likely will need some of this removed later during admission either with aggressive diuresis or even possibly dialysis. He remained in no respiratory distress after fluid resuscitation and continued to have persistent hypotension (SBP in 70-80s,  patient's normal BP runs  100's/50s).   Discussed with ICU, Cardiology and Hospitalist for admission. Given persistent hypotension after 30 cc/kg resuscitation, patient warrants ICU admission at this time for close monitoring, inotropic support, and likely CVP monitoring.    Patient and plan of care discussed with Attending physician, Dr. Billy Fischer.     Final Clinical Impressions(s) / ED Diagnoses   Final diagnoses:  Pain  Acute respiratory failure ([place])  Encounter for central line placement    New Prescriptions Current Discharge Medication List       Arnetha Massy, MD 07/11/17 5521    Gareth Morgan, MD 07/16/17 2149

## 2017-07-09 NOTE — Progress Notes (Signed)
Pharmacy Antibiotic Note  [name] is a 61 y.o. male admitted on 07/09/2017 with sepsis.  Pharmacy has been consulted for Vancomycin/Zosyn dosing. Pt presents to ED with shortness of breath. WBC elevated. Acute on chronic kidney failure present. Lactic acid elevated. CXR with atelectasis/infiltrate and pleural effusion.   Plan: Vancomycin 750 mg IV q24h, adjust dose as need if Scr improves Zosyn 3.375G IV q8h to be infused over 4 hours Trend WBC, temp, renal function  F/U infectious work-up Drug levels as indicated  Temp (24hrs), Avg:97.8 F (36.6 C), Min:97.8 F (36.6 C), Max:97.8 F (36.6 C)   Recent Labs Lab 07/09/17 2132 07/09/17 2212  WBC 18.1*  --   CREATININE 3.75* 3.70*  LATICACIDVEN  --  6.19*    CrCl cannot be calculated (Unknown ideal weight.).    No Known Allergies   Narda Bonds 07/09/2017 11:11 PM

## 2017-07-09 NOTE — ED Notes (Signed)
XRAY @bedside

## 2017-07-09 NOTE — ED Triage Notes (Signed)
Pt arrives via ems for shortness of breath. [place] and felt short of breath with groin pain was seen at [place] [facility] and dx with muscle strain. Was coming POV tonight but unable to make it to car because of edema in lower extremities and dyspnea. RA on arrival was 82 percent pt is 100 percent on RA in ED

## 2017-07-09 NOTE — ED Notes (Signed)
500cc fluid started per resident

## 2017-07-10 ENCOUNTER — Inpatient Hospital Stay (HOSPITAL_COMMUNITY): Payer: Medicare Other

## 2017-07-10 ENCOUNTER — Encounter (HOSPITAL_COMMUNITY): Payer: Self-pay | Admitting: Certified Registered Nurse Anesthetist

## 2017-07-10 DIAGNOSIS — M25551 Pain in right hip: Secondary | ICD-10-CM | POA: Diagnosis not present

## 2017-07-10 DIAGNOSIS — I5043 Acute on chronic combined systolic (congestive) and diastolic (congestive) heart failure: Secondary | ICD-10-CM | POA: Diagnosis not present

## 2017-07-10 DIAGNOSIS — D689 Coagulation defect, unspecified: Secondary | ICD-10-CM | POA: Diagnosis present

## 2017-07-10 DIAGNOSIS — I420 Dilated cardiomyopathy: Secondary | ICD-10-CM | POA: Diagnosis present

## 2017-07-10 DIAGNOSIS — T827XXA Infection and inflammatory reaction due to other cardiac and vascular devices, implants and grafts, initial encounter: Secondary | ICD-10-CM | POA: Diagnosis not present

## 2017-07-10 DIAGNOSIS — Z7901 Long term (current) use of anticoagulants: Secondary | ICD-10-CM | POA: Diagnosis not present

## 2017-07-10 DIAGNOSIS — A419 Sepsis, unspecified organism: Secondary | ICD-10-CM | POA: Diagnosis present

## 2017-07-10 DIAGNOSIS — N184 Chronic kidney disease, stage 4 (severe): Secondary | ICD-10-CM

## 2017-07-10 DIAGNOSIS — N179 Acute kidney failure, unspecified: Secondary | ICD-10-CM

## 2017-07-10 DIAGNOSIS — E1142 Type 2 diabetes mellitus with diabetic polyneuropathy: Secondary | ICD-10-CM | POA: Diagnosis not present

## 2017-07-10 DIAGNOSIS — Y95 Nosocomial condition: Secondary | ICD-10-CM | POA: Diagnosis present

## 2017-07-10 DIAGNOSIS — Z452 Encounter for adjustment and management of vascular access device: Secondary | ICD-10-CM | POA: Diagnosis not present

## 2017-07-10 DIAGNOSIS — I1310 Hypertensive heart and chronic kidney disease without heart failure, with stage 1 through stage 4 chronic kidney disease, or unspecified chronic kidney disease: Secondary | ICD-10-CM | POA: Diagnosis not present

## 2017-07-10 DIAGNOSIS — I480 Paroxysmal atrial fibrillation: Secondary | ICD-10-CM | POA: Diagnosis present

## 2017-07-10 DIAGNOSIS — I959 Hypotension, unspecified: Secondary | ICD-10-CM | POA: Diagnosis not present

## 2017-07-10 DIAGNOSIS — Z433 Encounter for attention to colostomy: Secondary | ICD-10-CM | POA: Diagnosis not present

## 2017-07-10 DIAGNOSIS — N186 End stage renal disease: Secondary | ICD-10-CM | POA: Diagnosis not present

## 2017-07-10 DIAGNOSIS — R570 Cardiogenic shock: Secondary | ICD-10-CM | POA: Insufficient documentation

## 2017-07-10 DIAGNOSIS — I214 Non-ST elevation (NSTEMI) myocardial infarction: Secondary | ICD-10-CM | POA: Diagnosis not present

## 2017-07-10 DIAGNOSIS — B9561 Methicillin susceptible Staphylococcus aureus infection as the cause of diseases classified elsewhere: Secondary | ICD-10-CM | POA: Diagnosis present

## 2017-07-10 DIAGNOSIS — R6521 Severe sepsis with septic shock: Secondary | ICD-10-CM

## 2017-07-10 DIAGNOSIS — I5023 Acute on chronic systolic (congestive) heart failure: Secondary | ICD-10-CM | POA: Diagnosis not present

## 2017-07-10 DIAGNOSIS — K7460 Unspecified cirrhosis of liver: Secondary | ICD-10-CM | POA: Diagnosis not present

## 2017-07-10 DIAGNOSIS — R791 Abnormal coagulation profile: Secondary | ICD-10-CM | POA: Diagnosis not present

## 2017-07-10 DIAGNOSIS — R5381 Other malaise: Secondary | ICD-10-CM | POA: Diagnosis not present

## 2017-07-10 DIAGNOSIS — M109 Gout, unspecified: Secondary | ICD-10-CM | POA: Diagnosis present

## 2017-07-10 DIAGNOSIS — N19 Unspecified kidney failure: Secondary | ICD-10-CM | POA: Diagnosis not present

## 2017-07-10 DIAGNOSIS — M009 Pyogenic arthritis, unspecified: Secondary | ICD-10-CM | POA: Diagnosis not present

## 2017-07-10 DIAGNOSIS — I442 Atrioventricular block, complete: Secondary | ICD-10-CM | POA: Diagnosis not present

## 2017-07-10 DIAGNOSIS — J9621 Acute and chronic respiratory failure with hypoxia: Secondary | ICD-10-CM | POA: Diagnosis present

## 2017-07-10 DIAGNOSIS — I951 Orthostatic hypotension: Secondary | ICD-10-CM | POA: Diagnosis not present

## 2017-07-10 DIAGNOSIS — G4733 Obstructive sleep apnea (adult) (pediatric): Secondary | ICD-10-CM | POA: Diagnosis not present

## 2017-07-10 DIAGNOSIS — Z85048 Personal history of other malignant neoplasm of rectum, rectosigmoid junction, and anus: Secondary | ICD-10-CM | POA: Diagnosis not present

## 2017-07-10 DIAGNOSIS — D638 Anemia in other chronic diseases classified elsewhere: Secondary | ICD-10-CM | POA: Diagnosis present

## 2017-07-10 DIAGNOSIS — I50810 Right heart failure, unspecified: Secondary | ICD-10-CM | POA: Diagnosis not present

## 2017-07-10 DIAGNOSIS — J9600 Acute respiratory failure, unspecified whether with hypoxia or hypercapnia: Secondary | ICD-10-CM | POA: Diagnosis not present

## 2017-07-10 DIAGNOSIS — Z8673 Personal history of transient ischemic attack (TIA), and cerebral infarction without residual deficits: Secondary | ICD-10-CM | POA: Diagnosis not present

## 2017-07-10 DIAGNOSIS — R188 Other ascites: Secondary | ICD-10-CM | POA: Diagnosis not present

## 2017-07-10 DIAGNOSIS — R64 Cachexia: Secondary | ICD-10-CM | POA: Diagnosis present

## 2017-07-10 DIAGNOSIS — Z8546 Personal history of malignant neoplasm of prostate: Secondary | ICD-10-CM | POA: Diagnosis not present

## 2017-07-10 DIAGNOSIS — E785 Hyperlipidemia, unspecified: Secondary | ICD-10-CM | POA: Diagnosis not present

## 2017-07-10 DIAGNOSIS — R0602 Shortness of breath: Secondary | ICD-10-CM | POA: Diagnosis not present

## 2017-07-10 DIAGNOSIS — I482 Chronic atrial fibrillation: Secondary | ICD-10-CM | POA: Diagnosis not present

## 2017-07-10 DIAGNOSIS — A4101 Sepsis due to Methicillin susceptible Staphylococcus aureus: Secondary | ICD-10-CM | POA: Diagnosis not present

## 2017-07-10 DIAGNOSIS — J189 Pneumonia, unspecified organism: Secondary | ICD-10-CM | POA: Diagnosis present

## 2017-07-10 DIAGNOSIS — R7881 Bacteremia: Secondary | ICD-10-CM | POA: Diagnosis not present

## 2017-07-10 DIAGNOSIS — I340 Nonrheumatic mitral (valve) insufficiency: Secondary | ICD-10-CM | POA: Diagnosis not present

## 2017-07-10 DIAGNOSIS — I255 Ischemic cardiomyopathy: Secondary | ICD-10-CM | POA: Diagnosis not present

## 2017-07-10 DIAGNOSIS — T07XXXA Unspecified multiple injuries, initial encounter: Secondary | ICD-10-CM | POA: Diagnosis not present

## 2017-07-10 DIAGNOSIS — A4901 Methicillin susceptible Staphylococcus aureus infection, unspecified site: Secondary | ICD-10-CM | POA: Diagnosis not present

## 2017-07-10 DIAGNOSIS — E162 Hypoglycemia, unspecified: Secondary | ICD-10-CM | POA: Diagnosis not present

## 2017-07-10 DIAGNOSIS — E669 Obesity, unspecified: Secondary | ICD-10-CM | POA: Diagnosis present

## 2017-07-10 DIAGNOSIS — J9601 Acute respiratory failure with hypoxia: Secondary | ICD-10-CM | POA: Diagnosis not present

## 2017-07-10 DIAGNOSIS — D62 Acute posthemorrhagic anemia: Secondary | ICD-10-CM | POA: Diagnosis not present

## 2017-07-10 DIAGNOSIS — B999 Unspecified infectious disease: Secondary | ICD-10-CM | POA: Diagnosis not present

## 2017-07-10 DIAGNOSIS — I252 Old myocardial infarction: Secondary | ICD-10-CM | POA: Diagnosis not present

## 2017-07-10 DIAGNOSIS — K219 Gastro-esophageal reflux disease without esophagitis: Secondary | ICD-10-CM | POA: Diagnosis present

## 2017-07-10 DIAGNOSIS — I132 Hypertensive heart and chronic kidney disease with heart failure and with stage 5 chronic kidney disease, or end stage renal disease: Secondary | ICD-10-CM | POA: Diagnosis present

## 2017-07-10 DIAGNOSIS — E43 Unspecified severe protein-calorie malnutrition: Secondary | ICD-10-CM | POA: Diagnosis not present

## 2017-07-10 DIAGNOSIS — M12851 Other specific arthropathies, not elsewhere classified, right hip: Secondary | ICD-10-CM | POA: Diagnosis not present

## 2017-07-10 DIAGNOSIS — Z9989 Dependence on other enabling machines and devices: Secondary | ICD-10-CM | POA: Diagnosis not present

## 2017-07-10 DIAGNOSIS — E1122 Type 2 diabetes mellitus with diabetic chronic kidney disease: Secondary | ICD-10-CM | POA: Diagnosis present

## 2017-07-10 DIAGNOSIS — Z992 Dependence on renal dialysis: Secondary | ICD-10-CM | POA: Diagnosis not present

## 2017-07-10 DIAGNOSIS — T827XXD Infection and inflammatory reaction due to other cardiac and vascular devices, implants and grafts, subsequent encounter: Secondary | ICD-10-CM | POA: Diagnosis not present

## 2017-07-10 DIAGNOSIS — I5022 Chronic systolic (congestive) heart failure: Secondary | ICD-10-CM | POA: Diagnosis not present

## 2017-07-10 DIAGNOSIS — D696 Thrombocytopenia, unspecified: Secondary | ICD-10-CM | POA: Diagnosis not present

## 2017-07-10 DIAGNOSIS — R52 Pain, unspecified: Secondary | ICD-10-CM | POA: Diagnosis not present

## 2017-07-10 DIAGNOSIS — I2510 Atherosclerotic heart disease of native coronary artery without angina pectoris: Secondary | ICD-10-CM | POA: Diagnosis not present

## 2017-07-10 DIAGNOSIS — E872 Acidosis: Secondary | ICD-10-CM | POA: Diagnosis present

## 2017-07-10 DIAGNOSIS — Z95810 Presence of automatic (implantable) cardiac defibrillator: Secondary | ICD-10-CM | POA: Diagnosis not present

## 2017-07-10 DIAGNOSIS — E871 Hypo-osmolality and hyponatremia: Secondary | ICD-10-CM | POA: Diagnosis present

## 2017-07-10 LAB — GLUCOSE, CAPILLARY
GLUCOSE-CAPILLARY: 244 mg/dL — AB (ref 65–99)
GLUCOSE-CAPILLARY: 264 mg/dL — AB (ref 65–99)
Glucose-Capillary: 246 mg/dL — ABNORMAL HIGH (ref 65–99)
Glucose-Capillary: 241 mg/dL — ABNORMAL HIGH (ref 65–99)

## 2017-07-10 LAB — ECHOCARDIOGRAM COMPLETE
CHL CUP MV DEC (S): 141
CHL CUP RV SYS PRESS: 80 mmHg
CHL CUP STROKE VOLUME: 43 mL
EERAT: 9.45
EWDT: 141 ms
FS: 17 % — AB (ref 28–44)
Height: 70 in
IV/PV OW: 1.09
LA vol A4C: 62.9 ml
LA vol: 64 mL
LADIAMINDEX: 2.31 cm/m2
LASIZE: 48 mm
LAVOLIN: 30.8 mL/m2
LEFT ATRIUM END SYS DIAM: 48 mm
LV PW d: 12.8 mm — AB (ref 0.6–1.1)
LV SIMPSON'S DISK: 19
LV TDI E'MEDIAL: 4.11
LV dias vol: 226 mL — AB (ref 62–150)
LV e' LATERAL: 10.9 cm/s
LV sys vol: 183 mL — AB (ref 21–61)
LVDIAVOLIN: 109 mL/m2
LVEEAVG: 9.45
LVEEMED: 9.45
LVOT VTI: 15.3 cm
LVOT area: 3.46 cm2
LVOT diameter: 21 mm
LVOTPV: 105 cm/s
LVOTSV: 53 mL
LVSYSVOLIN: 88 mL/m2
Lateral S' vel: 7.33 cm/s
MVPG: 4 mmHg
MVPKEVEL: 103 m/s
P 1/2 time: 382 ms
Reg peak vel: 402 cm/s
TDI e' lateral: 10.9
TR max vel: 402 cm/s
Weight: 3199.32 oz

## 2017-07-10 LAB — CBC WITH DIFFERENTIAL/PLATELET
BASOS PCT: 0 %
Basophils Absolute: 0 10*3/uL (ref 0.0–0.1)
EOS ABS: 0 10*3/uL (ref 0.0–0.7)
EOS PCT: 0 %
HEMATOCRIT: 25.2 % — AB (ref 39.0–52.0)
Hemoglobin: 8.3 g/dL — ABNORMAL LOW (ref 13.0–17.0)
Lymphocytes Relative: 2 %
Lymphs Abs: 0.6 10*3/uL — ABNORMAL LOW (ref 0.7–4.0)
MCH: 30.5 pg (ref 26.0–34.0)
MCHC: 32.9 g/dL (ref 30.0–36.0)
MCV: 92.6 fL (ref 78.0–100.0)
MONO ABS: 1.4 10*3/uL — AB (ref 0.1–1.0)
Monocytes Relative: 5 %
NEUTROS ABS: 26 10*3/uL — AB (ref 1.7–7.7)
Neutrophils Relative %: 93 %
PLATELETS: 175 10*3/uL (ref 150–400)
RBC: 2.72 MIL/uL — ABNORMAL LOW (ref 4.22–5.81)
RDW: 15.5 % (ref 11.5–15.5)
WBC: 28 10*3/uL — ABNORMAL HIGH (ref 4.0–10.5)

## 2017-07-10 LAB — COMPREHENSIVE METABOLIC PANEL
ALT: 16 U/L — AB (ref 17–63)
AST: 47 U/L — AB (ref 15–41)
Albumin: 2.1 g/dL — ABNORMAL LOW (ref 3.5–5.0)
Alkaline Phosphatase: 96 U/L (ref 38–126)
Anion gap: 19 — ABNORMAL HIGH (ref 5–15)
BILIRUBIN TOTAL: 2.8 mg/dL — AB (ref 0.3–1.2)
BUN: 104 mg/dL — AB (ref 6–20)
CHLORIDE: 91 mmol/L — AB (ref 101–111)
CO2: 16 mmol/L — ABNORMAL LOW (ref 22–32)
CREATININE: 3.68 mg/dL — AB (ref 0.61–1.24)
Calcium: 8.3 mg/dL — ABNORMAL LOW (ref 8.9–10.3)
GFR, EST AFRICAN AMERICAN: 19 mL/min — AB (ref >60)
GFR, EST NON AFRICAN AMERICAN: 16 mL/min — AB (ref >60)
Glucose, Bld: 252 mg/dL — ABNORMAL HIGH (ref 65–99)
POTASSIUM: 3.8 mmol/L (ref 3.5–5.1)
Sodium: 126 mmol/L — ABNORMAL LOW (ref 135–145)
TOTAL PROTEIN: 6.4 g/dL — AB (ref 6.5–8.1)

## 2017-07-10 LAB — BLOOD CULTURE ID PANEL (REFLEXED)
ACINETOBACTER BAUMANNII: NOT DETECTED
CANDIDA ALBICANS: NOT DETECTED
CANDIDA GLABRATA: NOT DETECTED
Candida krusei: NOT DETECTED
Candida parapsilosis: NOT DETECTED
Candida tropicalis: NOT DETECTED
ENTEROBACTER CLOACAE COMPLEX: NOT DETECTED
ENTEROBACTERIACEAE SPECIES: NOT DETECTED
Enterococcus species: NOT DETECTED
Escherichia coli: NOT DETECTED
HAEMOPHILUS INFLUENZAE: NOT DETECTED
Klebsiella oxytoca: NOT DETECTED
Klebsiella pneumoniae: NOT DETECTED
LISTERIA MONOCYTOGENES: NOT DETECTED
METHICILLIN RESISTANCE: NOT DETECTED
NEISSERIA MENINGITIDIS: NOT DETECTED
PSEUDOMONAS AERUGINOSA: NOT DETECTED
Proteus species: NOT DETECTED
STREPTOCOCCUS AGALACTIAE: NOT DETECTED
STREPTOCOCCUS PNEUMONIAE: NOT DETECTED
STREPTOCOCCUS PYOGENES: NOT DETECTED
STREPTOCOCCUS SPECIES: NOT DETECTED
Serratia marcescens: NOT DETECTED
Staphylococcus aureus (BCID): DETECTED — AB
Staphylococcus species: DETECTED — AB

## 2017-07-10 LAB — BASIC METABOLIC PANEL
ANION GAP: 15 (ref 5–15)
BUN: 106 mg/dL — AB (ref 6–20)
CALCIUM: 8.4 mg/dL — AB (ref 8.9–10.3)
CO2: 20 mmol/L — ABNORMAL LOW (ref 22–32)
CREATININE: 3.68 mg/dL — AB (ref 0.61–1.24)
Chloride: 89 mmol/L — ABNORMAL LOW (ref 101–111)
GFR calc Af Amer: 19 mL/min — ABNORMAL LOW (ref >60)
GFR, EST NON AFRICAN AMERICAN: 16 mL/min — AB (ref >60)
GLUCOSE: 268 mg/dL — AB (ref 65–99)
Potassium: 3.5 mmol/L (ref 3.5–5.1)
Sodium: 124 mmol/L — ABNORMAL LOW (ref 135–145)

## 2017-07-10 LAB — COOXEMETRY PANEL
CARBOXYHEMOGLOBIN: 1.9 % — AB (ref 0.5–1.5)
Methemoglobin: 1.2 % (ref 0.0–1.5)
O2 Saturation: 72.7 %
Total hemoglobin: 8.2 g/dL — ABNORMAL LOW (ref 12.0–16.0)

## 2017-07-10 LAB — URIC ACID: Uric Acid, Serum: 15.1 mg/dL — ABNORMAL HIGH (ref 4.4–7.6)

## 2017-07-10 LAB — PROTIME-INR
INR: 2.12
Prothrombin Time: 24.1 seconds — ABNORMAL HIGH (ref 11.4–15.2)

## 2017-07-10 LAB — LACTIC ACID, PLASMA
LACTIC ACID, VENOUS: 5.4 mmol/L — AB (ref 0.5–1.9)
LACTIC ACID, VENOUS: 4.7 mmol/L — AB (ref 0.5–1.9)
LACTIC ACID, VENOUS: 3.9 mmol/L — AB (ref 0.5–1.9)

## 2017-07-10 LAB — TYPE AND SCREEN
ABO/RH(D): O POS
Antibody Screen: NEGATIVE

## 2017-07-10 LAB — MRSA PCR SCREENING: MRSA BY PCR: NEGATIVE

## 2017-07-10 LAB — PHOSPHORUS: Phosphorus: 4.6 mg/dL (ref 2.5–4.6)

## 2017-07-10 LAB — I-STAT CG4 LACTIC ACID, ED: Lactic Acid, Venous: 5.31 mmol/L (ref 0.5–1.9)

## 2017-07-10 LAB — PROCALCITONIN: PROCALCITONIN: 3.96 ng/mL

## 2017-07-10 LAB — TROPONIN I
TROPONIN I: 0.11 ng/mL — AB (ref <0.03)
TROPONIN I: 41.84 ng/mL — AB (ref <0.03)
Troponin I: 1.05 ng/mL (ref <0.03)

## 2017-07-10 LAB — OSMOLALITY, URINE: OSMOLALITY UR: 317 mosm/kg (ref 300–900)

## 2017-07-10 LAB — SODIUM, URINE, RANDOM: SODIUM UR: 13 mmol/L

## 2017-07-10 MED ORDER — HEPARIN SODIUM (PORCINE) 1000 UNIT/ML DIALYSIS
1000.0000 [IU] | INTRAMUSCULAR | Status: DC | PRN
  Filled 2017-07-10: qty 6
  Filled 2017-07-10: qty 3
  Filled 2017-07-10: qty 1
  Filled 2017-07-10: qty 2

## 2017-07-10 MED ORDER — WARFARIN SODIUM 3 MG PO TABS
3.0000 mg | ORAL_TABLET | Freq: Once | ORAL | Status: AC
  Administered 2017-07-10: 3 mg via ORAL
  Filled 2017-07-10: qty 1

## 2017-07-10 MED ORDER — INSULIN ASPART 100 UNIT/ML ~~LOC~~ SOLN
0.0000 [IU] | Freq: Three times a day (TID) | SUBCUTANEOUS | Status: DC
  Administered 2017-07-10 (×3): 3 [IU] via SUBCUTANEOUS
  Administered 2017-07-11 – 2017-07-12 (×4): 5 [IU] via SUBCUTANEOUS
  Administered 2017-07-12: 3 [IU] via SUBCUTANEOUS
  Administered 2017-07-12: 7 [IU] via SUBCUTANEOUS
  Administered 2017-07-13 (×2): 3 [IU] via SUBCUTANEOUS
  Administered 2017-07-13: 2 [IU] via SUBCUTANEOUS
  Administered 2017-07-14: 1 [IU] via SUBCUTANEOUS
  Administered 2017-07-14 – 2017-07-16 (×8): 2 [IU] via SUBCUTANEOUS
  Administered 2017-07-17 (×2): 3 [IU] via SUBCUTANEOUS
  Administered 2017-07-17: 5 [IU] via SUBCUTANEOUS
  Administered 2017-07-18 – 2017-07-19 (×6): 2 [IU] via SUBCUTANEOUS
  Administered 2017-07-20 (×2): 1 [IU] via SUBCUTANEOUS
  Administered 2017-07-20: 2 [IU] via SUBCUTANEOUS
  Administered 2017-07-21 – 2017-07-22 (×2): 1 [IU] via SUBCUTANEOUS
  Administered 2017-07-22: 2 [IU] via SUBCUTANEOUS
  Administered 2017-07-26: 3 [IU] via SUBCUTANEOUS
  Administered 2017-07-27 – 2017-07-30 (×5): 1 [IU] via SUBCUTANEOUS
  Administered 2017-07-31: 2 [IU] via SUBCUTANEOUS
  Administered 2017-08-01: 1 [IU] via SUBCUTANEOUS

## 2017-07-10 MED ORDER — DEXTROSE 5 % IV SOLN
0.0000 ug/min | INTRAVENOUS | Status: DC
  Administered 2017-07-12 – 2017-07-13 (×2): 11 ug/min via INTRAVENOUS
  Administered 2017-07-14: 10 ug/min via INTRAVENOUS
  Administered 2017-07-16 – 2017-07-17 (×2): 9 ug/min via INTRAVENOUS
  Administered 2017-07-19: 7 ug/min via INTRAVENOUS
  Administered 2017-07-21: 5 ug/min via INTRAVENOUS
  Filled 2017-07-10 (×10): qty 16

## 2017-07-10 MED ORDER — ATORVASTATIN CALCIUM 40 MG PO TABS
40.0000 mg | ORAL_TABLET | Freq: Every day | ORAL | Status: DC
  Administered 2017-07-10 – 2017-07-13 (×4): 40 mg via ORAL
  Filled 2017-07-10 (×4): qty 1

## 2017-07-10 MED ORDER — FUROSEMIDE 10 MG/ML IJ SOLN
10.0000 mg/h | INTRAVENOUS | Status: DC
  Administered 2017-07-10: 10 mg/h via INTRAVENOUS
  Filled 2017-07-10: qty 25

## 2017-07-10 MED ORDER — FUROSEMIDE 10 MG/ML IJ SOLN
40.0000 mg | Freq: Once | INTRAMUSCULAR | Status: AC
  Administered 2017-07-10: 40 mg via INTRAVENOUS
  Filled 2017-07-10: qty 4

## 2017-07-10 MED ORDER — SODIUM CHLORIDE 0.9 % IV SOLN
250.0000 mL | INTRAVENOUS | Status: DC | PRN
  Administered 2017-07-10: 250 mL via INTRAVENOUS
  Administered 2017-07-12: 500 mL via INTRAVENOUS

## 2017-07-10 MED ORDER — WARFARIN - PHARMACIST DOSING INPATIENT
Freq: Every day | Status: DC
  Administered 2017-07-10: 18:00:00

## 2017-07-10 MED ORDER — ATORVASTATIN CALCIUM 40 MG PO TABS: 40.0000 mg | ORAL_TABLET | Freq: Every day | ORAL | Status: DC

## 2017-07-10 MED ORDER — PERFLUTREN LIPID MICROSPHERE
1.0000 mL | INTRAVENOUS | Status: AC | PRN
  Administered 2017-07-10: 4 mL via INTRAVENOUS
  Filled 2017-07-10: qty 10

## 2017-07-10 MED ORDER — COLCHICINE 0.6 MG PO TABS
0.3000 mg | ORAL_TABLET | Freq: Every day | ORAL | Status: DC
  Administered 2017-07-11 – 2017-08-02 (×23): 0.3 mg via ORAL
  Filled 2017-07-10 (×24): qty 1

## 2017-07-10 MED ORDER — SODIUM CHLORIDE 0.9 % IV SOLN
300.0000 mg | Freq: Three times a day (TID) | INTRAVENOUS | Status: DC
  Administered 2017-07-10 – 2017-07-12 (×6): 300 mg via INTRAVENOUS
  Filled 2017-07-10 (×7): qty 300

## 2017-07-10 MED ORDER — ASPIRIN 81 MG PO CHEW
324.0000 mg | CHEWABLE_TABLET | ORAL | Status: AC
  Administered 2017-07-10: 324 mg via ORAL
  Filled 2017-07-10: qty 4

## 2017-07-10 MED ORDER — FUROSEMIDE 10 MG/ML IJ SOLN
10.0000 mg/h | INTRAVENOUS | Status: DC
  Administered 2017-07-10: 10 mg/h via INTRAVENOUS
  Filled 2017-07-10: qty 21

## 2017-07-10 MED ORDER — WARFARIN SODIUM 2 MG PO TABS: 2.0000 mg | ORAL_TABLET | Freq: Once | ORAL | Status: DC

## 2017-07-10 MED ORDER — DEXTROSE 5 % IV SOLN
0.0000 ug/min | INTRAVENOUS | Status: DC
  Administered 2017-07-10: 10 ug/min via INTRAVENOUS
  Filled 2017-07-10: qty 16

## 2017-07-10 MED ORDER — NOREPINEPHRINE BITARTRATE 1 MG/ML IV SOLN
0.0000 ug/min | INTRAVENOUS | Status: DC
  Filled 2017-07-10: qty 4

## 2017-07-10 MED ORDER — NOREPINEPHRINE BITARTRATE 1 MG/ML IV SOLN
0.0000 ug/min | INTRAVENOUS | Status: DC
  Administered 2017-07-10: 8 ug/min via INTRAVENOUS
  Filled 2017-07-10 (×2): qty 4

## 2017-07-10 MED ORDER — FENTANYL CITRATE (PF) 100 MCG/2ML IJ SOLN
25.0000 ug | INTRAMUSCULAR | Status: DC | PRN
  Administered 2017-07-10 – 2017-07-28 (×24): 25 ug via INTRAVENOUS
  Filled 2017-07-10 (×24): qty 2

## 2017-07-10 MED ORDER — CEFAZOLIN SODIUM-DEXTROSE 2-4 GM/100ML-% IV SOLN
2.0000 g | Freq: Two times a day (BID) | INTRAVENOUS | Status: DC
  Administered 2017-07-10 – 2017-07-29 (×38): 2 g via INTRAVENOUS
  Filled 2017-07-10 (×42): qty 100

## 2017-07-10 MED ORDER — ASPIRIN 300 MG RE SUPP: 300.0000 mg | RECTAL | Status: AC

## 2017-07-10 MED ORDER — HEPARIN 1000 UNIT/ML FOR PERITONEAL DIALYSIS
3.0000 mL | INTRAMUSCULAR | Status: DC | PRN
  Filled 2017-07-10: qty 3

## 2017-07-10 MED ORDER — FENTANYL CITRATE (PF) 100 MCG/2ML IJ SOLN
25.0000 ug | Freq: Once | INTRAMUSCULAR | Status: AC
Start: 2017-07-10 — End: 2017-07-10
  Administered 2017-07-10: 25 ug via INTRAVENOUS
  Filled 2017-07-10: qty 2

## 2017-07-10 MED ORDER — HEPARIN SODIUM (PORCINE) 5000 UNIT/ML IJ SOLN
5000.0000 [IU] | Freq: Three times a day (TID) | INTRAMUSCULAR | Status: DC
  Administered 2017-07-10: 5000 [IU] via SUBCUTANEOUS
  Filled 2017-07-10: qty 1

## 2017-07-10 NOTE — Progress Notes (Signed)
[name] Progress Note Patient Name: [name] DOB: [birth year] MRN: [id number]   Date of Service  07/10/2017  HPI/Events of Note  Called d/t Troponin = 41.54. Cardiology is following this patient.   eICU Interventions  Will request that bedside nurse call cardiology with the Troponin result.      Intervention Category Intermediate Interventions: Diagnostic test evaluation  Sherina Stammer [place] 07/10/2017, 9:15 PM

## 2017-07-10 NOTE — Progress Notes (Signed)
ANTICOAGULATION CONSULT NOTE - Initial Consult  Pharmacy Consult for warfarin Indication: atrial fibrillation  No Known Allergies  Patient Measurements: Height: 5\' 10"  (177.8 cm) Weight: 199 lb 15.3 oz (90.7 kg) IBW/kg (Calculated) : 73  Vital Signs: Temp: 97.7 F (36.5 C) (08/05 0847) Temp Source: Oral (08/05 0847) BP: 93/50 (08/05 0700) Pulse Rate: 70 (08/05 0700)  Labs:  Recent Labs  07/09/17 2132 07/09/17 2212 07/10/17 0200  HGB 8.2* 8.8*  --   HCT 24.9* 26.0*  --   PLT 158  --   --   LABPROT 22.0*  --   --   INR 1.89  --   --   CREATININE 3.75* 3.70*  --   TROPONINI  --   --  0.11*    Estimated Creatinine Clearance: 23.5 mL/min (A) (by C-G formula based on SCr of 3.7 mg/dL (H)).   Medical History: Past Medical History:  Diagnosis Date  . Adenocarcinoma of rectum ([place])    a. 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 AICD (automatic cardioverter/defibrillator) present 2002   BI V ICD  . Anemia   . CAD (coronary artery disease)    a. [facility] LAD 2001 [facility] b. PTCA/atherectomy ramus and BMS to LAD 2009  . CHF (congestive heart failure) ([place])   . Cholelithiasis 06/2015  . Chronic kidney disease, stage IV (severe) ([place])   . Chronic systolic heart failure ([place])   . Colostomy in place [facility])   . Dizziness    a. chronic. Admission for this 07/18/2014  . DM type 2, uncontrolled, with renal complications ([place])   . Dysrhythmia   . Essential hypertension, benign   . GERD (gastroesophageal reflux disease)   . HCAP (healthcare-associated pneumonia) 07/21/2015  . Hematuria   . History of blood transfusion    "I've had 2 units so far this year" (09/27/2015)  . HLD (hyperlipidemia)   . Ischemic cardiomyopathy    EF 18% by nuclear study 2016, multiple myocardial infarctions in past    . Myocardial infarction ([place]) 2001  . Obesity   . Orthostatic hypotension   . OSA on CPAP   . Paroxysmal atrial fibrillation ([facility])    a. on amiodarone, digoxin and Eliquis  . PONV (postoperative nausea  and vomiting)   . Prostate cancer ([place])    a. s/p seed implants with chemo and radiation  . SAH (subarachnoid hemorrhage) ([place])    post-traumatic (fall) [facility] 12/2014  . TIA (transient ischemic attack)    Assessment: 61 year old male with chronic afib well known to pharmacy service for warfarin dosing. Patient recently seen in clinic with supratherapeutic INR 3.7 dose reduced to 1mg  of coumadin daily except 2mg  on Tuesday, Thursday, Saturday.   INR on admit is low at 1.8, pt reports compliance. No bleeding issues noted, here with sepsis vs low output volume overload. Will give extra warfarin tonight.  Patient was placed on sq heparin by critical care will d/c once INR >2.  Goal of Therapy:  INR 2-3 Monitor platelets by anticoagulation protocol: Yes   Plan:  Warfarin 2mg  tonight Daily INR D/C heparin once INR >2  Erin Hearing PharmD., BCPS Clinical Pharmacist Pager [phone] 07/10/2017 9:27 AM

## 2017-07-10 NOTE — Progress Notes (Signed)
CRITICAL VALUE ALERT  Critical Value:  Troponin 41.54   Date & Time Notied:  08/05 2100  Provider Notified: Dr. Aundra Dubin    Orders Received/Actions taken: Order received to restart lasix drip and to do EKG. Patient denies chest pain at this time. Richarda Blade RN

## 2017-07-10 NOTE — ED Notes (Signed)
Critical care MD notified of critical lactic of 5.31

## 2017-07-10 NOTE — Progress Notes (Addendum)
Patient ID: [name], male   DOB: 10/13/1956, 61 y.o.   MRN: [id number]     Advanced Heart Failure Rounding Note  Primary Cardiologist: [name]  Subjective:    Admitted with fatigue, dyspnea, recurrent lower extremity swelling and decreased UOP.   This morning, SBP 80s-90s.  Does not have central access.  Feels "bad," tired.    CXR: right pleural effusion   Objective:   Weight Range: 199 lb 15.3 oz (90.7 kg) Body mass index is 28.69 kg/m.   Vital Signs:   Temp:  [97.8 F (36.6 C)-98 F (36.7 C)] 98 F (36.7 C) (08/05 0300) Pulse Rate:  [67-130] 70 (08/05 0700) Resp:  [14-28] 17 (08/05 0700) BP: (72-95)/(39-62) 93/50 (08/05 0700) SpO2:  [93 %-100 %] 94 % (08/05 0700) Weight:  [199 lb 15.3 oz (90.7 kg)] 199 lb 15.3 oz (90.7 kg) (08/05 0300)    Weight change: Filed Weights   07/10/17 0300  Weight: 199 lb 15.3 oz (90.7 kg)    Intake/Output:   Intake/Output Summary (Last 24 hours) at 07/10/17 0842 Last data filed at 07/10/17 0700  Gross per 24 hour  Intake           2737.5 ml  Output                0 ml  Net           2737.5 ml      Physical Exam    General:  Chronically ill-appearing.  HEENT: Normal Neck: Supple. JVP 16+. Carotids 2+ bilat; no bruits. No lymphadenopathy or thyromegaly appreciated. Cor: Lateral PMI. Irregular rate & rhythm. No rubs, gallops or murmurs. Lungs: Decreased breath sounds right base.  Abdomen: Soft, mild RUQ tenderness, mildly distended. No hepatosplenomegaly. No bruits or masses. Good bowel sounds. Extremities: No cyanosis, clubbing, rash.  1+ edema to thighs.  Neuro: Alert & orientedx3, cranial nerves grossly intact. moves all 4 extremities w/o difficulty. Affect pleasant   Telemetry   Personally reviewed, atrial fibrillation with BiV pacing   Labs    CBC  Recent Labs  07/09/17 2132 07/09/17 2212  WBC 18.1*  --   NEUTROABS 16.8*  --   HGB 8.2* 8.8*  HCT 24.9* 26.0*  MCV 92.6  --   PLT 158  --    Basic  Metabolic Panel  Recent Labs  07/09/17 2132 07/09/17 2212 07/10/17 0200  NA 124* 124*  --   K 3.7 3.7  --   CL 88* 89*  --   CO2 20*  --   --   GLUCOSE 282* 280*  --   BUN 106* 97*  --   CREATININE 3.75* 3.70*  --   CALCIUM 8.7*  --   --   MG 2.3  --   --   PHOS  --   --  4.6   Liver Function Tests  Recent Labs  07/09/17 2132  AST 45*  ALT 14*  ALKPHOS 98  BILITOT 2.5*  PROT 6.4*  ALBUMIN 2.2*   No results for input(s): LIPASE, AMYLASE in the last 72 hours. Cardiac Enzymes  Recent Labs  07/10/17 0200  TROPONINI 0.11*    BNP: BNP (last 3 results)  Recent Labs  06/13/17 1205 07/09/17 2151  BNP 1,151.0* 1,553.9*    ProBNP (last 3 results) No results for input(s): PROBNP in the last 8760 hours.   D-Dimer No results for input(s): DDIMER in the last 72 hours. Hemoglobin A1C No results for input(s): HGBA1C in the  last 72 hours. Fasting Lipid Panel No results for input(s): CHOL, HDL, LDLCALC, TRIG, CHOLHDL, LDLDIRECT in the last 72 hours. Thyroid Function Tests No results for input(s): TSH, T4TOTAL, T3FREE, THYROIDAB in the last 72 hours.  Invalid input(s): FREET3  Other results:   Imaging    Dg Chest Portable 1 View  Result Date: 07/09/2017 CLINICAL DATA:  61 year old male with shortness of breath. EXAM: PORTABLE CHEST 1 VIEW COMPARISON:  Chest radiograph dated 06/13/2017 FINDINGS: There is a small right pleural effusion with right lung base airspace density which may represent atelectasis versus infiltrate. Overall the size of the pleural effusion and right lung base density appears relatively similar to prior radiograph. The left lung is clear. There is no pneumothorax. Stable cardiac silhouette. Left pectoral AICD device. No acute osseous pathology. IMPRESSION: Right pleural effusion and right lung base atelectasis versus infiltrate. Clinical correlation is recommended. Electronically Signed   By: [name] M.D.   On: 07/09/2017 23:11   Dg  Hip Unilat With Pelvis 2-3 Views Right  Result Date: 07/10/2017 CLINICAL DATA:  61 year old male with right hip pain. No known injury. EXAM: DG HIP (WITH OR WITHOUT PELVIS) 2-3V RIGHT COMPARISON:  CT of the abdomen pelvis dated 05/05/2016 FINDINGS: There is no acute fracture or dislocation. The bones are mildly osteopenic. There is mild osteoarthritic changes of the hips. Multiple metallic biopsy clips noted in the region of the prostate gland. The soft tissues appear unremarkable with IMPRESSION: No acute fracture or dislocation. Mild arthritic changes. Electronically Signed   By: [name] M.D.   On: 07/10/2017 02:42      Medications:     Scheduled Medications: . colchicine  0.3 mg Oral Daily  . furosemide  40 mg Intravenous Once  . heparin  5,000 Units Subcutaneous Q8H  . insulin aspart  0-9 Units Subcutaneous TID WC     Infusions: . sodium chloride 250 mL (07/10/17 0700)  . furosemide (LASIX) infusion    . piperacillin-tazobactam (ZOSYN)  IV 3.375 g (07/10/17 0600)  . vancomycin       PRN Medications:  sodium chloride, fentaNYL (SUBLIMAZE) injection    Patient Profile   [name] is a 61 year old with a history of DMII, [place] 2016, rectal cancer, CKD Stage IV, chronic A fib, S/P AVN ablation, PAF, CAD BMS LAD 2011 BMS LAD 2009, OSA, chronic systolic heart failure, St Jude BiV ICD admitted with AKI and shock with elevated lactate, suspect cardiogenic.   Assessment/Plan   1. Acute on chronic systolic CHF/cardiogenic shock: Last echo in 8/17 with EF 15-20%.  Ischemic cardiomyopathy, has St Jude BiV ICD (despite chronic afib appears to be effectively BiV pacing).  Had recent admission (discharged 06/21/17) requiring milrinone support and diuresis.  Admitted with soft BP (SBP 80s-90s) + elevated lactate.  Some concern initially for PNA/septic shock, but suspect CXR shows R effusion rather than PNA, no fever.  He looks quite ill, volume overloaded on exam.   - He does not have  central access.  Will place central line now and send co-ox, will likely initiate dobutamine gtt with rising creatinine and soft BP.  May need norepinephrine gtt.  - Once inotrope begun and BP stabilized, will start Lasix gtt for diuresis.  - Poor prognosis overall, not candidate for advanced therapies.  2. AKI on CKD stage 4: Baseline creatinine around 2.5.  Up to 3.7 at admission, suspect cardiorenal syndrome with low output/hypotension.  He required brief HD in the past, has RUE fistula. -  May end up needing CVVH, will need nephrology to see. 3. Atrial fibrillation: Chronic.  On warfarin, INR 1.89 today.  Warfarin per pharmacy.  INR should be ok for CVL placement today.  4. CAD: No chest pain.  He can continue home statin.  5. ID: WBCs 18, afebrile.  Covering with vanc/Zosyn for PNA given initial concern for septic shock, but suspect R-sided pleural effusion and cardiogenic shock.  6. H/O Rectal CA with diverting colostomy  7. Type II diabetes: Sliding scale.   CRITICAL CARE Performed by: Loralie Champagne  Total critical care time: 35 minutes  Critical care time was exclusive of separately billable procedures and treating other patients.  Critical care was necessary to treat or prevent imminent or life-threatening deterioration.  Critical care was time spent personally by me on the following activities: development of treatment plan with patient and/or surrogate as well as nursing, discussions with consultants, evaluation of patient's response to treatment, examination of patient, obtaining history from patient or surrogate, ordering and performing treatments and interventions, ordering and review of laboratory studies, ordering and review of radiographic studies, pulse oximetry and re-evaluation of patient's condition.   Length of Stay: 0  Loralie Champagne, MD  07/10/2017, 8:42 AM  Advanced Heart Failure Team Pager [phone] (M-F; 7a - 4p)  Please contact [facility] for night-coverage  after hours (4p -7a ) and weekends on [email or web address]  CVL placed, CVP 17-18 range with co-ox 73% on norepinephrine 8.  SBP stable.  Continue Lasix gtt at 10 mg/hr.   MSSA bacteremia.  He is on vancomycin.  Source uncertain, has BiV ICD at risk for seeding.  ID consulted.   Troponin 41 this evening.  Has had no chest pain.  ?Due to sepsis but has history of CAD and certainly at risk for MI.  INR > 2, has been on warfarin.  Cath not an option at this point with probable mixed septic/cardiogenic shock and AKI.    Loralie Champagne 07/10/2017

## 2017-07-10 NOTE — Progress Notes (Signed)
CRITICAL VALUE ALERT  Critical Value:  Lactic acid 3.9   Troponin 1.05  Date & Time Notied:  07/10/2017 12:27 PM  Provider Notified: Marni Griffon, NP (CCM)  Orders Received/Actions taken: No new orders at this time; NP will be seeing pt shortly for central line insertion

## 2017-07-10 NOTE — Consult Note (Signed)
CARDIOLOGY INPATIENT CONSULTATION NOTE  Patient ID: [name] MRN: [id number], DOB/AGE: June 27, 1956   Admit date: 07/09/2017   Primary Physician: Redmond School, MD Primary Cardiologist: Carlyle Dolly MD  Reason for Consult:   Congestive heart failure  Requesting Physician: Rolan Lipa  HPI: This is a 61 y.o. white male with IDDMII, [place] 2016, rectal cancer, CKD Stage IV, chronic A fib, S/P AVN ablation, PAF, CAD BMS LAD 2011 BMS LAD 2009, OSA, chronic systolic heart failure, St Jude BiV ICD admitted to [facility] [place] [facility] with increased lower extremity edema and fatigue.  Patient was recently admitted from 7/13-7/17 and was treated with milrinone and lasix + metolazone. Reduced wt by 18 lbs to 174 lbs @ d/c. D/c cr was 2.4 and BP was 100/50. Diuretic regimen 2.5 mg metolazone prn and torsemide 80 mg AM and 40 mg PM dose. He remained intolerant to BB and ACEi. Patient again presented with similar symptoms and with elevated lactate of 6, white count of 18 with right lower lobe infiltrates and ongoing productive cough x 7 d this time. Sputum is yellowish and scant. Recent visit to beach. Able to ambulate at home but at the beach developed RLQ pain for which he went to urgent care and found to have possible muscular pain. Records not available. The pain has not relieved since then. There is some mild leg swelling.  In ED received 2.5 L IV bolus of NS, vanco/zosyn. CXR showed RLL infiltrate. Cr 3.7 prior d/c cr was 2.5.  CARDIOGRAPHICS ECHO 2017 EF 10015% Grade III DD  CAD--> s/p BMS LAD in 2001, PTCA ramus and BMS LAD in 2009. Cardiolite (3/16) with EF 18%, no ischemia, prior anterior, apical and inferior infarction. NSTEMI in 5/16.  Myoview done 04/26/15 showed EF 20% with large region of prior infarct involving the apical anterior, anteroseptal, inferoseptal and inferior walls with extension to the true apex. Small region of reversible/inducible ischemia along the margin of the  prior infarct at the mid ventricular anterior and anteroseptal walls. No change from previous.    Problem List: Past Medical History:  Diagnosis Date  . Adenocarcinoma of rectum ([place])    a. 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 AICD (automatic cardioverter/defibrillator) present 2002   BI V ICD  . Anemia   . CAD (coronary artery disease)    a. BMS to LAD 2001 at [facility] b. PTCA/atherectomy ramus and BMS to LAD 2009  . CHF (congestive heart failure) ([place])   . Cholelithiasis 06/2015  . Chronic kidney disease, stage IV (severe) ([place])   . Chronic systolic heart failure ([place])   . Colostomy in place [facility])   . Dizziness    a. chronic. Admission for this 07/18/2014  . DM type 2, uncontrolled, with renal complications ([place])   . Dysrhythmia   . Essential hypertension, benign   . GERD (gastroesophageal reflux disease)   . HCAP (healthcare-associated pneumonia) 07/21/2015  . Hematuria   . History of blood transfusion    "I've had 2 units so far this year" (09/27/2015)  . HLD (hyperlipidemia)   . Ischemic cardiomyopathy    EF 18% by nuclear study 2016, multiple myocardial infarctions in past    . Myocardial infarction ([place]) 2001  . Obesity   . Orthostatic hypotension   . OSA on CPAP   . Paroxysmal atrial fibrillation (HCC)    a. on amiodarone, digoxin and Eliquis  . PONV (postoperative nausea and vomiting)   . Prostate cancer ([place])    a. s/p seed implants with  chemo and radiation  . SAH (subarachnoid hemorrhage) ([place])    post-traumatic (fall) [facility] 12/2014  . TIA (transient ischemic attack)     Past Surgical History:  Procedure Laterality Date  . Abdominal and Perineal Resection of Rectum with Total Mesorectal Excision  10/04/2007  . AV FISTULA PLACEMENT Right 09/16/2015   Procedure: ARTERIOVENOUS (AV) FISTULA CREATION - BRACHIOCEPHALIC;  Surgeon: Elam Dutch, MD;  Location: [place];  Service: Vascular;  Laterality: Right;  . BI-VENTRICULAR IMPLANTABLE CARDIOVERTER DEFIBRILLATOR  (CRT-D)  2009  . CARDIAC  CATHETERIZATION  08/2001; 2009   ; [name] 07/10/2013  . CARDIAC DEFIBRILLATOR PLACEMENT  2002  . CARDIAC DEFIBRILLATOR PLACEMENT  2009   Upgraded to a BiV ICD  . COLONOSCOPY  09/14/2011   Dr. Gala Romney: via colostomy, Single pedunculated benign inflammatory polyp. Due for surveillance Oct 2015  . COLONOSCOPY N/A 07/02/2014   Procedure: COLONOSCOPY;  Surgeon: Daneil Dolin, MD;  [facility];  Service: Endoscopy;  Laterality: N/A;  7:30 / COLONOSCOPY THRU COLOSTOMY  . COLONOSCOPY N/A 12/11/2014   Dr. Gala Romney via colostomy. Normal. Repeat in 2021.   Marland Kitchen COLONOSCOPY N/A 08/24/2015   Dr. Havery Moros: diminutive polyp not removed, stoma site of bleeding  . COLOSTOMY  2008  . CORONARY ANGIOPLASTY WITH STENT PLACEMENT  2001; ~ 2006   "1 + 1"   . ELECTROPHYSIOLOGIC STUDY N/A 08/28/2015   Procedure: AV Node Ablation;  Surgeon: Will Meredith Leeds, MD;  Location: [place] CV LAB;  Service: Cardiovascular;  Laterality: N/A;  . EP IMPLANTABLE DEVICE N/A 04/10/2015   Procedure: Ppm/Biv Ppm Generator Changeout;  Surgeon: Evans Lance, MD;  Location: [place] INVASIVE CV LAB CUPID;  Service: Cardiovascular;  Laterality: N/A;  . ERCP N/A 06/11/2016   Procedure: ENDOSCOPIC RETROGRADE CHOLANGIOPANCREATOGRAPHY (ERCP);  Surgeon: Teena Irani, MD;  Location: [facility] ENDOSCOPY;  Service: Endoscopy;  Laterality: N/A;  . ERCP N/A 01/26/2017   Procedure: ENDOSCOPIC RETROGRADE CHOLANGIOPANCREATOGRAPHY (ERCP);  Surgeon: Teena Irani, MD;  Location: [facility] ENDOSCOPY;  Service: Endoscopy;  Laterality: N/A;  . ERCP  06/2016   Dr. Amedeo Plenty: duodenal fistula, dilated bile duct but no obvious stones, s/p sphincterotomy and stent placement  . ERCP  01/2017   Dr. Amedeo Plenty: CBD stones, s/p sphincterotomy, balloon extraction, stent removal  . ESOPHAGOGASTRODUODENOSCOPY N/A 07/02/2014   Procedure: ESOPHAGOGASTRODUODENOSCOPY (EGD);  Surgeon: Daneil Dolin, MD;  [facility];  Service: Endoscopy;  Laterality: N/A;  7:30  .  ESOPHAGOGASTRODUODENOSCOPY N/A 12/11/2014   BTD:[id number] EGD  . ESOPHAGOGASTRODUODENOSCOPY (EGD) WITH PROPOFOL N/A 04/18/2017   Procedure: ESOPHAGOGASTRODUODENOSCOPY (EGD) WITH PROPOFOL;  Surgeon: Daneil Dolin, MD;  [facility];  Service: Endoscopy;  Laterality: N/A;  2:30pm  . GASTROINTESTINAL STENT REMOVAL N/A 01/26/2017   Procedure: GASTROINTESTINAL STENT REMOVAL;  Surgeon: Teena Irani, MD;  Location: [place];  Service: Endoscopy;  Laterality: N/A;  . GIVENS CAPSULE STUDY N/A 07/23/2015   Dr. Michail Sermon: small non-bleeding AVM, mild gastritis  . LEFT HEART CATHETERIZATION WITH CORONARY ANGIOGRAM N/A 07/13/2013   Procedure: LEFT HEART CATHETERIZATION WITH CORONARY ANGIOGRAM;  Surgeon: Lorretta Harp, MD;  Location: [facility] CATH LAB;  Service: Cardiovascular;  Laterality: N/A;  . Venia Minks DILATION N/A 07/02/2014   Procedure: Venia Minks DILATION;  Surgeon: Daneil Dolin, MD;  [facility];  Service: Endoscopy;  Laterality: N/A;  7:30  . MALONEY DILATION N/A 04/18/2017   Procedure: Venia Minks DILATION;  Surgeon: Daneil Dolin, MD;  [facility];  Service: Endoscopy;  Laterality: N/A;  .  PORTACATH PLACEMENT  06/2007   "removed ~ 1 yr later"  . RIGHT HEART CATHETERIZATION N/A 02/24/2015   Procedure: RIGHT HEART CATH;  Surgeon: Jolaine Artist, MD;  Location: [facility] CATH LAB;  Service: Cardiovascular;  Laterality: N/A;  . SAVORY DILATION N/A 07/02/2014   Procedure: SAVORY DILATION;  Surgeon: Daneil Dolin, MD;  [facility];  Service: Endoscopy;  Laterality: N/A;  7:30     Allergies: No Known Allergies   Home Medications Current Facility-Administered Medications  Medication Dose Route Frequency Provider Last Rate Last Dose  . 0.9 %  sodium chloride infusion  250 mL Intravenous PRN Scatliffe, Kristen D, MD 10 mL/hr at 07/10/17 0700 250 mL at 07/10/17 0700  . colchicine tablet 0.3 mg  0.3 mg Oral Daily Scatliffe, Kristen D, MD      . fentaNYL (SUBLIMAZE) injection  25 mcg  25 mcg Intravenous Q2H PRN Scatliffe, Rise Paganini, MD   25 mcg at 07/10/17 0235  . furosemide (LASIX) 250 mg in dextrose 5 % 250 mL (1 mg/mL) infusion  10 mg/hr Intravenous Continuous Geralynn Ochs T, MD      . furosemide (LASIX) injection 40 mg  40 mg Intravenous Once Geralynn Ochs T, MD      . heparin injection 5,000 Units  5,000 Units Subcutaneous Q8H Scatliffe, Rise Paganini, MD   5,000 Units at 07/10/17 0534  . insulin aspart (novoLOG) injection 0-9 Units  0-9 Units Subcutaneous TID WC Scatliffe, Kristen D, MD      . piperacillin-tazobactam (ZOSYN) IVPB 3.375 g  3.375 g Intravenous Q8H Ledford, James L, RPH 12.5 mL/hr at 07/10/17 0600 3.375 g at 07/10/17 0600  . vancomycin (VANCOCIN) IVPB 750 mg/150 ml premix  750 mg Intravenous Q24H Erenest Blank, RPH         Family History  Problem Relation Age of Onset  . Colon cancer Mother 30  . Coronary artery disease Father   . Colon cancer Sister 8  . Diabetes Sister   . Colon cancer Other        2 cousins, succumbed to illness     Social History   Social History  . Marital status: Married    Spouse name: N/A  . Number of children: 1  . Years of education: N/A   Occupational History  .  Unemployed   Social History Main Topics  . Smoking status: Never Smoker  . Smokeless tobacco: Never Used  . Alcohol use No     Comment: Former user 45 years ago  . Drug use: No  . Sexual activity: No   Other Topics Concern  . Not on file   Social History Narrative   Married 57 years   1 son, 2 grandkids    Denies alcohol, drugs, tobacco      Review of Systems: General: fatigue increase weight negative for chills, fever, night sweats  Cardiovascular: leg edema, dyspnea but no chest pain  Dermatological: negative for rash Respiratory: productive cough of yellowish sputum, DOE Urologic: negative for hematuria Abdominal: right lower quadrant pain negative for nausea, vomiting, diarrhea, bright red blood per rectum, melena, or  hematemesis Neurologic: negative for visual changes, syncope, or dizziness Hematology: anemia Psychiatry: non suicidal/homicidal  Musculoskeletal: back pain   Physical Exam: Vitals: BP (!) 93/50   Pulse 70   Temp 98 F (36.7 C) (Oral)   Resp 17   Ht 5\' 10"  (1.778 m)   Wt 90.7 kg (199 lb 15.3 oz)   SpO2 94%  BMI 28.69 kg/m  General: somnolent, not in acute distress Neck: JVP elevated, neck supple Heart: sinus brady, split S2, grade II/VI holosystolic  Murmur at PMI of MR Lungs: CTAB  GI: RLQ tenderness, mild swelling, no overlying bruiseExtremities: no edema Neuro: AAO x 3, somnolent Psych: somnolent, normal affect, no anxiety   Labs:   Results for orders placed or performed during the hospital encounter of 07/09/17 (from the past 24 hour(s))  CBC with Differential     Status: Abnormal   Collection Time: 07/09/17  9:32 PM  Result Value Ref Range   WBC 18.1 (H) 4.0 - 10.5 K/uL   RBC 2.69 (L) 4.22 - 5.81 MIL/uL   Hemoglobin 8.2 (L) 13.0 - 17.0 g/dL   HCT 24.9 (L) 39.0 - 52.0 %   MCV 92.6 78.0 - 100.0 fL   MCH 30.5 26.0 - 34.0 pg   MCHC 32.9 30.0 - 36.0 g/dL   RDW 15.3 11.5 - 15.5 %   Platelets 158 150 - 400 K/uL   Neutrophils Relative % 93 %   Lymphocytes Relative 2 %   Monocytes Relative 5 %   Eosinophils Relative 0 %   Basophils Relative 0 %   Neutro Abs 16.8 (H) 1.7 - 7.7 K/uL   Lymphs Abs 0.4 (L) 0.7 - 4.0 K/uL   Monocytes Absolute 0.9 0.1 - 1.0 K/uL   Eosinophils Absolute 0.0 0.0 - 0.7 K/uL   Basophils Absolute 0.0 0.0 - 0.1 K/uL   RBC Morphology POLYCHROMASIA PRESENT    WBC Morphology MILD LEFT SHIFT (1-5% METAS, OCC MYELO, OCC BANDS)   Comprehensive metabolic panel     Status: Abnormal   Collection Time: 07/09/17  9:32 PM  Result Value Ref Range   Sodium 124 (L) 135 - 145 mmol/L   Potassium 3.7 3.5 - 5.1 mmol/L   Chloride 88 (L) 101 - 111 mmol/L   CO2 20 (L) 22 - 32 mmol/L   Glucose, Bld 282 (H) 65 - 99 mg/dL   BUN 106 (H) 6 - 20 mg/dL   Creatinine,  Ser 3.75 (H) 0.61 - 1.24 mg/dL   Calcium 8.7 (L) 8.9 - 10.3 mg/dL   Total Protein 6.4 (L) 6.5 - 8.1 g/dL   Albumin 2.2 (L) 3.5 - 5.0 g/dL   AST 45 (H) 15 - 41 U/L   ALT 14 (L) 17 - 63 U/L   Alkaline Phosphatase 98 38 - 126 U/L   Total Bilirubin 2.5 (H) 0.3 - 1.2 mg/dL   GFR calc non Af Amer 16 (L) >60 mL/min   GFR calc Af Amer 18 (L) >60 mL/min   Anion gap 16 (H) 5 - 15  Protime-INR     Status: Abnormal   Collection Time: 07/09/17  9:32 PM  Result Value Ref Range   Prothrombin Time 22.0 (H) 11.4 - 15.2 seconds   INR 1.89   Magnesium     Status: None   Collection Time: 07/09/17  9:32 PM  Result Value Ref Range   Magnesium 2.3 1.7 - 2.4 mg/dL  Brain natriuretic peptide     Status: Abnormal   Collection Time: 07/09/17  9:51 PM  Result Value Ref Range   B Natriuretic Peptide 1,553.9 (H) 0.0 - 100.0 pg/mL  I-stat troponin, ED     Status: Abnormal   Collection Time: 07/09/17 10:10 PM  Result Value Ref Range   Troponin i, poc 0.09 (HH) 0.00 - 0.08 ng/mL   Comment NOTIFIED PHYSICIAN    Comment 3  I-Stat Chem 8, ED     Status: Abnormal   Collection Time: 07/09/17 10:12 PM  Result Value Ref Range   Sodium 124 (L) 135 - 145 mmol/L   Potassium 3.7 3.5 - 5.1 mmol/L   Chloride 89 (L) 101 - 111 mmol/L   BUN 97 (H) 6 - 20 mg/dL   Creatinine, Ser 3.70 (H) 0.61 - 1.24 mg/dL   Glucose, Bld 280 (H) 65 - 99 mg/dL   Calcium, Ion 0.99 (L) 1.15 - 1.40 mmol/L   TCO2 20 0 - 100 mmol/L   Hemoglobin 8.8 (L) 13.0 - 17.0 g/dL   HCT 26.0 (L) 39.0 - 52.0 %  I-Stat CG4 Lactic Acid, ED     Status: Abnormal   Collection Time: 07/09/17 10:12 PM  Result Value Ref Range   Lactic Acid, Venous 6.19 (HH) 0.5 - 1.9 mmol/L   Comment NOTIFIED PHYSICIAN   Urinalysis, Routine w reflex microscopic     Status: Abnormal   Collection Time: 07/09/17 10:15 PM  Result Value Ref Range   Color, Urine YELLOW YELLOW   APPearance HAZY (A) CLEAR   Specific Gravity, Urine 1.015 1.005 - 1.030   pH 5.0 5.0 - 8.0    Glucose, UA 100 (A) NEGATIVE mg/dL   Hgb urine dipstick TRACE (A) NEGATIVE   Bilirubin Urine NEGATIVE NEGATIVE   Ketones, ur NEGATIVE NEGATIVE mg/dL   Protein, ur NEGATIVE NEGATIVE mg/dL   Nitrite NEGATIVE NEGATIVE   Leukocytes, UA TRACE (A) NEGATIVE  Urinalysis, Microscopic (reflex)     Status: Abnormal   Collection Time: 07/09/17 10:15 PM  Result Value Ref Range   RBC / HPF 0-5 0 - 5 RBC/hpf   WBC, UA 0-5 0 - 5 WBC/hpf   Bacteria, UA RARE (A) NONE SEEN   Squamous Epithelial / LPF 0-5 (A) NONE SEEN   Hyaline Casts, UA PRESENT    Amorphous Crystal PRESENT   Osmolality, urine     Status: None   Collection Time: 07/09/17 10:15 PM  Result Value Ref Range   Osmolality, Ur 317 300 - 900 mOsm/kg  Sodium, urine, random     Status: None   Collection Time: 07/09/17 10:15 PM  Result Value Ref Range   Sodium, Ur 13 mmol/L  Lactic acid, plasma     Status: Abnormal   Collection Time: 07/10/17  1:32 AM  Result Value Ref Range   Lactic Acid, Venous 5.4 (HH) 0.5 - 1.9 mmol/L  Phosphorus     Status: None   Collection Time: 07/10/17  2:00 AM  Result Value Ref Range   Phosphorus 4.6 2.5 - 4.6 mg/dL  Troponin I     Status: Abnormal   Collection Time: 07/10/17  2:00 AM  Result Value Ref Range   Troponin I 0.11 (HH) <0.03 ng/mL  Type and screen If need to transfuse blood products please use the blood administration order set     Status: None   Collection Time: 07/10/17  2:07 AM  Result Value Ref Range   ABO/RH(D) O POS    Antibody Screen NEG    Sample Expiration 07/13/2017   I-Stat CG4 Lactic Acid, ED     Status: Abnormal   Collection Time: 07/10/17  2:13 AM  Result Value Ref Range   Lactic Acid, Venous 5.31 (HH) 0.5 - 1.9 mmol/L   Comment NOTIFIED PHYSICIAN   MRSA PCR Screening     Status: None   Collection Time: 07/10/17  2:48 AM  Result Value Ref Range   MRSA  by PCR NEGATIVE NEGATIVE  Lactic acid, plasma     Status: Abnormal   Collection Time: 07/10/17  4:27 AM  Result Value Ref  Range   Lactic Acid, Venous 4.7 (HH) 0.5 - 1.9 mmol/L     Radiology/Studies: Dg Chest 2 View  Result Date: 06/13/2017 CLINICAL DATA:  Fluid retention, no improvement with medication EXAM: CHEST  2 VIEW COMPARISON:  06/09/2016 FINDINGS: There is a small right pleural effusion with right basilar atelectasis. There is mild bilateral interstitial thickening. There is no left pleural effusion. There is no pneumothorax. There is mild stable cardiomegaly. There is a 3 lead cardiac pacemaker. The osseous structures are unremarkable. IMPRESSION: 1. Small right pleural effusion. Electronically Signed   By: Kathreen Devoid   On: 06/13/2017 13:30   Dg Chest Portable 1 View  Result Date: 07/09/2017 CLINICAL DATA:  61 year old male with shortness of breath. EXAM: PORTABLE CHEST 1 VIEW COMPARISON:  Chest radiograph dated 06/13/2017 FINDINGS: There is a small right pleural effusion with right lung base airspace density which may represent atelectasis versus infiltrate. Overall the size of the pleural effusion and right lung base density appears relatively similar to prior radiograph. The left lung is clear. There is no pneumothorax. Stable cardiac silhouette. Left pectoral AICD device. No acute osseous pathology. IMPRESSION: Right pleural effusion and right lung base atelectasis versus infiltrate. Clinical correlation is recommended. Electronically Signed   By: Anner Crete M.D.   On: 07/09/2017 23:11   Dg Hip Unilat With Pelvis 2-3 Views Right  Result Date: 07/10/2017 CLINICAL DATA:  61 year old male with right hip pain. No known injury. EXAM: DG HIP (WITH OR WITHOUT PELVIS) 2-3V RIGHT COMPARISON:  CT of the abdomen pelvis dated 05/05/2016 FINDINGS: There is no acute fracture or dislocation. The bones are mildly osteopenic. There is mild osteoarthritic changes of the hips. Multiple metallic biopsy clips noted in the region of the prostate gland. The soft tissues appear unremarkable with IMPRESSION: No acute fracture or  dislocation. Mild arthritic changes. Electronically Signed   By: Anner Crete M.D.   On: 07/10/2017 02:42    EKG: V paced rhythm, underling afib ECHO 2017 EF 10015% Grade III DD   CAD--> s/p BMS LAD in 2001, PTCA ramus and BMS LAD in 2009. Cardiolite (3/16) with EF 18%, no ischemia, prior anterior, apical and inferior infarction. NSTEMI in 5/16.  Myoview done 04/26/15 showed EF 20% with large region of prior infarct involving the apical anterior, anteroseptal, inferoseptal and inferior walls with extension to the true apex. Small region of reversible/inducible ischemia along the margin of the prior infarct at the mid ventricular anterior and anteroseptal walls. No change from previous.   Medical decision making:  Discussed care with the patient Discussed care with the physician on the phone Reviewed labs and imaging personally Reviewed prior records  ASSESSMENT AND PLAN:  This is a 61 y.o. male [place]DMII, [place], rectal cancer, CKD Stage IV, chronic A fib, S/P AVN ablation, PAF, CAD BMS LAD 2011 BMS LAD 2009, OSA, chronic systolic heart failure, St Jude BiV ICD admitted to [facility] with increased lower extremity edema and fatigue.   Principal Problem:   Septic shock ([place]) Active Problems:   Ischemic cardiomyopathy-EF 35% 04/24/15 echo   Long term current use of anticoagulant therapy   CAD with LCX disease and stents to LAD   Chronic a-fib (HCC)   Chronic kidney disease, stage IV (severe) (HCC)   OSA on CPAP   Hyperlipidemia  BiV ICD (St Jude) gen change 04/10/15   Chronic atrial fibrillation (HCC)   Right heart failure (HCC)   Septic shock in the setting of ischemic dilated cardiomyopathy and acute renal failure, source: pneumonia, warm and wet, lactate of 6 - treat per medicine team for sepsis, agree with levophed as inotrope/pressor. Consider invasive  CVP monitoring, goal CVP 10, will need central access. Treat as sepsis rather than cardiogenic shock for now.     Acute on chronic systolic congestive heart failure, mild pulmonary congestion warm and wet - received 2 L IV fluid in the ED, try to achieve euvolemia.   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 Monitor CVP keep >10. Start on IV lasix drip @ 10 mg/hr after 80 mg bolus of IV lasix and if poor response by 12 hours, then will need additional metolazone 2.5 mg daily. Strict I/Os, consider foley.   Chronic atrial fibrillation CHADS2VASc = 3 (chf, htn, dm) - rate controlled, paced rhythm  Right lower quadrant abdominal pain, tender on palpation and muscle tension, likely muscular in origin, could be hematoma  Ordered non contrast CT scan   Long term use of anticoagulation on coumadin, INR subtherapeutic 1.9 , pharmacy to dose  Hyperlipidemia - continue statin    Signed, Flossie Dibble, MD MS 07/10/2017, 7:43 AM

## 2017-07-10 NOTE — ED Notes (Signed)
Patient transported to X-ray 

## 2017-07-10 NOTE — Progress Notes (Signed)
ANTICOAGULATION CONSULT NOTE   Pharmacy Consult for warfarin Indication: atrial fibrillation  No Known Allergies  Patient Measurements: Height: 5\' 10"  (177.8 cm) Weight: 199 lb 15.3 oz (90.7 kg) IBW/kg (Calculated) : 73  Vital Signs: Temp: 97.3 F (36.3 C) (08/05 1621) Temp Source: Oral (08/05 1621) BP: 99/51 (08/05 1645) Pulse Rate: 79 (08/05 1645)  Labs:  Recent Labs  07/09/17 2132 07/09/17 2212 07/10/17 0200 07/10/17 1115  HGB 8.2* 8.8*  --  8.3*  HCT 24.9* 26.0*  --  25.2*  PLT 158  --   --  175  LABPROT 22.0*  --   --  24.1*  INR 1.89  --   --  2.12  CREATININE 3.75* 3.70*  --  3.68*  TROPONINI  --   --  0.11* 1.05*    Estimated Creatinine Clearance: 23.6 mL/min (A) (by C-G formula based on SCr of 3.68 mg/dL (H)).  Assessment: 61 year old male with chronic afib well known to pharmacy service for warfarin dosing. Patient recently seen in clinic with supratherapeutic INR 3.7 dose reduced to 1mg  of coumadin daily except 2mg  on Tuesday, Thursday, Saturday.   INR is therapeutic today but now adding rifampin which can decrease warfarin levels.   Goal of Therapy:  INR 2-3 Monitor platelets by anticoagulation protocol: Yes   Plan:  Give warfarin 3mg  PO x 1 tonight Daily INR  Salome Arnt, PharmD, BCPS Pager # [phone] 07/10/2017 4:50 PM

## 2017-07-10 NOTE — Progress Notes (Signed)
  Echocardiogram 2D Echocardiogram with definity has been performed.  Darlina Sicilian M 07/10/2017, 11:35 AM

## 2017-07-10 NOTE — Progress Notes (Signed)
Pharmacy Antibiotic Note  [name] is a 61 y.o. male admitted on 07/09/2017 with sepsis.  Pharmacy was initially consulted for Vancomycin/Zosyn dosing but now narrowing to cefazolin for MSSA bacteremia. Also starting rifampin. Pt is afebrile and WBC is elevated at 28. SCr is elevated at 3.68.   Plan: Cefazolin 2gm IV Q12H F/u renal fxn, C&S, clinical status  Temp (24hrs), Avg:97.7 F (36.5 C), Min:97.3 F (36.3 C), Max:98 F (36.7 C)   Recent Labs Lab 07/09/17 2132 07/09/17 2212 07/10/17 0132 07/10/17 0213 07/10/17 0427 07/10/17 1115  WBC 18.1*  --   --   --   --  28.0*  CREATININE 3.75* 3.70*  --   --   --  3.68*  LATICACIDVEN  --  6.19* 5.4* 5.31* 4.7* 3.9*    Estimated Creatinine Clearance: 23.6 mL/min (A) (by C-G formula based on SCr of 3.68 mg/dL (H)).    No Known Allergies   [name], [name] 07/10/2017 4:51 PM

## 2017-07-10 NOTE — H&P (Addendum)
Name: [name] MRN:   [id number] DOB:   December 26, 1955         ADMISSION DATE:  07/09/2017 CONSULTATION DATE: 07/09/17  REFERRING MD : ER CHIEF COMPLAINT: Increased abdominal girth and bilateral leg swelling.   BRIEF PATIENT DESCRIPTION:  61 yr old male with known HFrEF last EF15% with CKD stage IV presented on 8/4 with signs of fluid overload and h/o of poor urine output for 1 week.  SIGNIFICANT EVENTS  Hypotensive on presentation  STUDIES:  CXR   HISTORY OF PRESENT ILLNESS:  61 yr old male with extensive PMHx MI, HFrEF EF15%-20% ,BiVICD, CKD Stage IV, DM, GERD, OSA on CPAP, PAF, presents with weight gain, lower extremity swelling and decreased urine output within the last week. Two weeks ago patient presented to an urgent care when he had pain in his right groin and received flexeril, and ketorolac(last taken this morning) he was also given a script for Bactrim which he did not take. He states that his blood pressure runs low normally. His concern was regarding his abdomen and legs.   PAST MEDICAL HISTORY :   has a past medical history of Adenocarcinoma of rectum ([place]); AICD (automatic cardioverter/defibrillator) present (2002); Anemia; CAD (coronary artery disease); CHF (congestive heart failure) ([place]); Cholelithiasis (06/2015); Chronic kidney disease, stage IV (severe) ([place]); Chronic systolic heart failure ([place]); Colostomy in place [facility]); Dizziness; DM type 2, uncontrolled, with renal complications ([place]); Dysrhythmia; Essential hypertension, benign; GERD (gastroesophageal reflux disease); HCAP (healthcare-associated pneumonia) (07/21/2015); Hematuria; History of blood transfusion; HLD (hyperlipidemia); Ischemic cardiomyopathy; Myocardial infarction ([place]) (2001); Obesity; Orthostatic hypotension; OSA on CPAP; Paroxysmal atrial fibrillation ([place]); PONV (postoperative nausea and vomiting); Prostate cancer ([place]); SAH (subarachnoid hemorrhage) ([place]); and TIA (transient ischemic attack).  has a past surgical history that includes Cardiac defibrillator placement (2002); Abdominal and Perineal Resection of Rectum with Total Mesorectal Excision (10/04/2007); Colonoscopy (09/14/2011); Colostomy (2008); Colonoscopy (N/A, 07/02/2014); Esophagogastroduodenoscopy (N/A, 07/02/2014); Savory dilation (N/A, 07/02/2014); maloney dilation (N/A, 07/02/2014); Portacath placement (06/2007); left heart catheterization with coronary angiogram (N/A, 07/13/2013); Colonoscopy (N/A, 12/11/2014); Esophagogastroduodenoscopy (N/A, 12/11/2014); Cardiac defibrillator placement (2009); right heart catheterization (N/A, 02/24/2015); Cardiac catheterization (N/A, 04/10/2015); Bi-ventricular implantable cardioverter defibrillator  (crt-d) (2009); Cardiac catheterization (08/2001; 2009); Coronary angioplasty with stent (2001; ~ 2006); Givens capsule study (N/A, 07/23/2015); Colonoscopy (N/A, 08/24/2015); Cardiac catheterization (N/A, 08/28/2015); AV fistula placement (Right, 09/16/2015); ERCP (N/A, 06/11/2016); ERCP (N/A, 01/26/2017); Gastrointestinal stent removal (N/A, 01/26/2017); ERCP (06/2016); ERCP (01/2017); Esophagogastroduodenoscopy (egd) with propofol (N/A, 04/18/2017); and maloney dilation (N/A, 04/18/2017).        Prior to Admission medications   Medication Sig Start Date End Date Taking? Authorizing Provider  atorvastatin (LIPITOR) 40 MG tablet Take 1 tablet (40 mg total) by mouth daily. Patient taking differently: Take 40 mg by mouth at bedtime.  02/15/17 07/10/17 Yes Bensimhon, Shaune Pascal, MD  Coenzyme Q10 (COQ10) 50 MG CAPS Take 1 capsule by mouth daily.   Yes [provider]  collagenase (SANTYL) ointment Apply topically daily. Patient taking differently: Apply topically every other day. At night 04/28/17  Yes Lavina Hamman, MD  ferrous sulfate 325 (65 FE) MG tablet Take 1 tablet (325 mg total) by mouth 3 (three) times daily with meals. 04/28/17  Yes Berle Mull M, MD  glucose blood test strip Use to test blood sugar 3  times daily 03/23/17  Yes Elayne Snare, MD  insulin aspart (NOVOLOG FLEXPEN) 100 UNIT/ML FlexPen 5 units before each meal Patient taking differently: Inject 6 Units into the skin 3 (three)  times daily with meals. 5 units before each meal 03/23/17  Yes Elayne Snare, MD  metolazone (ZAROXOLYN) 2.5 MG tablet take 1 tablet by mouth weekly if needed for SWELLING 06/27/17  Yes Branch, Alphonse Guild, MD  metoprolol succinate (TOPROL-XL) 25 MG 24 hr tablet Take 0.5 tablets (12.5 mg total) by mouth 2 (two) times daily. 06/22/17  Yes Clegg, Amy D, NP  nitroGLYCERIN (NITROSTAT) 0.4 MG SL tablet Place 1 tablet (0.4 mg total) under the tongue every 5 (five) minutes as needed for chest pain. 06/10/17 09/08/17 Yes BranchAlphonse Guild, MD  pantoprazole (PROTONIX) 40 MG tablet Take 1 tablet (40 mg total) by mouth daily. 05/07/16  Yes Shirley Friar, PA-C  polyethylene glycol [facility] / GLYCOLAX) packet Take 17 g by mouth daily as needed for mild constipation. 04/28/17  Yes Lavina Hamman, MD  potassium chloride SA (K-DUR,KLOR-CON) 20 MEQ tablet Take 2 tablets (40 mEq total) by mouth daily. Patient taking differently: Take 20-40 mEq by mouth daily. Only takes 2 tablets when taking metolazone 06/21/17  Yes Clegg, Amy D, NP  PROAIR RESPICLICK 947 (90 BASE) MCG/ACT AEPB Inhale 1 puff into the lungs every 6 (six) hours as needed (for breathing).  08/15/15  Yes [provider]  torsemide (DEMADEX) 20 MG tablet TAKE 4 TABLETS IN THE MORNING AND 2 TABLET IN THE EVENING 06/21/17  Yes Clegg, Amy D, NP  warfarin (COUMADIN) 2 MG tablet Take 1 tablet daily except 1/2 tablet on Tuesdays and Fridays Patient taking differently: Take 1 tablet daily except 1/2 tablet on Tuesdays and Thursdays 05/26/17  Yes Bensimhon, Shaune Pascal, MD  Insulin Glargine (LANTUS SOLOSTAR) 100 UNIT/ML Solostar Pen Inject 15 Units into the skin daily at 10 pm. Patient not taking: Reported on 06/13/2017 04/13/17   Elayne Snare, MD   No Known  Allergies  FAMILY HISTORY:  family history includes Colon cancer in his other; Colon cancer (age of onset: 62) in his sister; Colon cancer (age of onset: 32) in his mother; Coronary artery disease in his father; Diabetes in his sister. SOCIAL HISTORY:  reports that he has never smoked. He has never used smokeless tobacco. He reports that he does not drink alcohol or use drugs.  REVIEW OF SYSTEMS:   Constitutional: Negative for fever, chills, weight loss, malaise/fatigue and diaphoresis.  HENT: Negative for hearing loss, ear pain, nosebleeds, congestion, sore throat, neck pain, tinnitus and ear discharge.   Eyes: Negative for blurred vision, double vision, photophobia, pain, discharge and redness.  Respiratory: Negative for cough, hemoptysis, sputum production, shortness of breath, wheezing and stridor.   Cardiovascular: Negative for chest pain, palpitations, orthopnea, claudication, leg swelling and PND.  Gastrointestinal: Negative for heartburn, nausea, vomiting, abdominal pain, diarrhea, constipation, blood in stool and melena.  Genitourinary: Negative for dysuria, urgency, frequency, hematuria and flank pain.  Musculoskeletal: Negative for myalgias, back pain, joint pain and falls.  Skin: Negative for itching and rash.  Neurological: Negative for dizziness, tingling, tremors, sensory change, speech change, focal weakness, seizures, loss of consciousness, weakness and headaches.  Endo/Heme/Allergies: Negative for environmental allergies and polydipsia. Does not bruise/bleed easily.  SUBJECTIVE:   VITAL SIGNS: Temp:  [97.8 F (36.6 C)] 97.8 F (36.6 C) (08/04 2132) Pulse Rate:  [70-77] 75 (08/05 0015) Resp:  [14-28] 14 (08/05 0015) BP: (76-93)/(41-56) 89/56 (08/05 0015) SpO2:  [99 %-100 %] 99 % (08/05 0015)  .Marland KitchenPhysical Exam  Constitutional: He is oriented to person, place, and time. No distress.  HENT:  Head: Normocephalic and atraumatic.  Eyes: Pupils are equal, round, and  reactive to light. EOM are normal. No scleral icterus.  Neck: Normal range of motion. Neck supple.  Cardiovascular: Normal rate and intact distal pulses.  Exam reveals no friction rub.   S1 and S2 + S3  Pulmonary/Chest: Effort normal. He has rales.  bibasilar rales   Abdominal: He exhibits distension.  Decreased bowel sounds  Musculoskeletal: He exhibits edema.  Decreased ROM in right knee +3 pitting edema to thighs in bilateral lower ext  Neurological: He is alert and oriented to person, place, and time. No cranial nerve deficit.  Skin: He is not diaphoretic.  Excoriations noted on anterior chest, right side of neck and along arms    Last Labs    Recent Labs Lab 07/09/17 2132 07/09/17 2212  NA 124* 124*  K 3.7 3.7  CL 88* 89*  CO2 20*  --   BUN 106* 97*  CREATININE 3.75* 3.70*  GLUCOSE 282* 280*      Last Labs    Recent Labs Lab 07/09/17 2132 07/09/17 2212  HGB 8.2* 8.8*  HCT 24.9* 26.0*  WBC 18.1*  --   PLT 158  --       Imaging Results (Last 48 hours)  Dg Chest Portable 1 View  Result Date: 07/09/2017 CLINICAL DATA:  61 year old male with shortness of breath. EXAM: PORTABLE CHEST 1 VIEW COMPARISON:  Chest radiograph dated 06/13/2017 FINDINGS: There is a small right pleural effusion with right lung base airspace density which may represent atelectasis versus infiltrate. Overall the size of the pleural effusion and right lung base density appears relatively similar to prior radiograph. The left lung is clear. There is no pneumothorax. Stable cardiac silhouette. Left pectoral AICD device. No acute osseous pathology. IMPRESSION: Right pleural effusion and right lung base atelectasis versus infiltrate. Clinical correlation is recommended. Electronically Signed   By: Anner Crete M.D.   On: 07/09/2017 23:11     ASSESSMENT / RECOMMEDNDATIONS:   NEURO: No active issues at the time of my assessment Avoid sedative medications at this time Pain mgmt with  Fentanyl 25 mcg IV  CARDIAC: HFrEF last documented at EF15-20% Pt received 30cc/kg for Sepsis bundle recommend de-resuscitation with diuretics Hyponatremia 124 - poor prognostic sign BNP 1553 Trop 0.09 Recommend Cardiology consult Using MAP goal >65   PULMONARY: Pulmonary edema with Right sided effusion Secondary to fluid overload from HFrEF Not requiring supplemental Oxygen Not tachypniec  ID: Low suspicion for Sepsis LA- elevated however that may be secondary to hypoperfusion from poor CO Cultures:repeated blood cx x 2 and urine cx in ED Previous cx drawn on 06/11/16- negative Right base of lung on CXR- effusion Lower extremity has skin breakdown LLE>[name] low suspicion for cellulitis Vancomycin and Zosyn ordered in ED for empiric coverage Given renal function strongly recommend renal dosing of antibiotics with Vanc level  Endocrine: Blood sugars elevated  GI: Cardiac & Renal diet (low electrolyte load) ok- as tolerated Abdominal distension due to fluid overload Given elevated Lactic Acid will get a KUB May need CT Abd/pelvis- non emergent  Heme: Compliant with Coumadin took last dose today INR 1.89 Current Hgb 8.2 If Hgb <8 evaluate for any active bleeding Weigh risks/beneift ratio of transfusion  RENAL Pt has a h/o dialysis in the past and improved Within last week pt reports poor UOP in response to his home does of Lasix Increase in serum creatinine of ?200% and UOP<0.41ml/kg/hr  RIFLE Loss /Stage IV AKIN/ KDIGO  Recent Labs  Lab Results  Component Value Date   CREATININE 3.70 (H) 07/09/2017   CREATININE 3.75 (H) 07/09/2017   CREATININE 2.49 (H) 06/21/2017     AGMA- secondary to Lactic acidosis.  AG 16. LA 6.19 BS mildly elevated at 280 Started on insulin sliding scale Hypervolemic Hyponatremia  Secondary to Heart failure May be responsive to Tolvaptan Recommend Urine lytes and Osm Current GFR 18 Avoid nephrotoxic meds Consult Renal    Rheumatology C/o Right hip and knee pain with decreased ROM ? GOUT vs direct injury Rx with colchicine (renally dosed)  Pain meds prn for right hip XR Right hip ordered  . I, Dr Seward Carol have personally reviewed patient's available data, including medical history, events of note, physical examination and test results as part of my evaluation.  I have discussed the plan of care with the medical team (including consultants, NP/PA, RN, ED physician and E-link) and patient/patient's family members. He is AAOx4 in no acute distress he does however require optimization of his heart failure and fluid status.  CODE STATUS: FULL DISPOSITION: ICU Family: wife at bedside. I discussed and explained current medical condition in detail   The patient is critically ill with multiple organ systems failure and requires high complexity decision making for assessment and support, frequent evaluation and titration of therapies, application of advanced monitoring technologies and extensive interpretation of multiple databases.  Critical Care Time devoted to patient care services described in this note is >40 Minutes. This time reflects time of care of this signee Dr Seward Carol. This critical care time does not reflect procedure time, or teaching time or supervisory time of PA/NP/Med student/Med Resident etc but could involve care discussion time    Dr. Seward Carol [facility]

## 2017-07-10 NOTE — Progress Notes (Signed)
PHARMACY - PHYSICIAN COMMUNICATION CRITICAL VALUE ALERT - BLOOD CULTURE IDENTIFICATION (BCID)  Results for orders placed or performed during the hospital encounter of 07/09/17  Blood Culture ID Panel (Reflexed) (Collected: 07/09/2017 10:04 PM)  Result Value Ref Range   Enterococcus species NOT DETECTED NOT DETECTED   Listeria monocytogenes NOT DETECTED NOT DETECTED   Staphylococcus species DETECTED (A) NOT DETECTED   Staphylococcus aureus DETECTED (A) NOT DETECTED   Methicillin resistance NOT DETECTED NOT DETECTED   Streptococcus species NOT DETECTED NOT DETECTED   Streptococcus agalactiae NOT DETECTED NOT DETECTED   Streptococcus pneumoniae NOT DETECTED NOT DETECTED   Streptococcus pyogenes NOT DETECTED NOT DETECTED   Acinetobacter baumannii NOT DETECTED NOT DETECTED   Enterobacteriaceae species NOT DETECTED NOT DETECTED   Enterobacter cloacae complex NOT DETECTED NOT DETECTED   Escherichia coli NOT DETECTED NOT DETECTED   Klebsiella oxytoca NOT DETECTED NOT DETECTED   Klebsiella pneumoniae NOT DETECTED NOT DETECTED   Proteus species NOT DETECTED NOT DETECTED   Serratia marcescens NOT DETECTED NOT DETECTED   Haemophilus influenzae NOT DETECTED NOT DETECTED   Neisseria meningitidis NOT DETECTED NOT DETECTED   Pseudomonas aeruginosa NOT DETECTED NOT DETECTED   Candida albicans NOT DETECTED NOT DETECTED   Candida glabrata NOT DETECTED NOT DETECTED   Candida krusei NOT DETECTED NOT DETECTED   Candida parapsilosis NOT DETECTED NOT DETECTED   Candida tropicalis NOT DETECTED NOT DETECTED    Name of physician (or Provider) Contacted: [place] (CCM)  Changes to prescribed antibiotics required: Consider narrowing antibiotics to cefazolin 2gm IV Q12H  [name], [name] 07/10/2017  4:18 PM

## 2017-07-10 NOTE — Progress Notes (Signed)
CRITICAL VALUE ALERT  Critical Value:  Lactic Acid 4.7  Date & Time Notied:  07/10/17 5:13 AM  Provider Notified: eLink notified.  Orders Received/Actions taken: No new orders at this time.  Sherlie Ban, RN

## 2017-07-10 NOTE — Consult Note (Deleted)
..   Name: [name] MRN: [id number] DOB: 1955/12/19    ADMISSION DATE:  07/09/2017 CONSULTATION DATE: 07/09/17  REFERRING MD : ER CHIEF COMPLAINT: Increased abdominal girth and bilateral leg swelling.   BRIEF PATIENT DESCRIPTION:  61 yr old male with known HFrEF last EF15% with CKD stage IV presented on 8/4 with signs of fluid overload and h/o of poor urine output for 1 week.  SIGNIFICANT EVENTS  Hypotensive on presentation  STUDIES:  CXR   HISTORY OF PRESENT ILLNESS:  61 yr old male with extensive PMHx MI, HFrEF EF15%-20% ,BiVICD, CKD Stage IV, DM, GERD, OSA on CPAP, PAF, presents with weight gain, lower extremity swelling and decreased urine output within the last week. Two weeks ago patient presented to an urgent care when he had pain in his right groin and received flexeril, and ketorolac(last taken this morning) he was also given a script for Bactrim which he did not take. He states that his blood pressure runs low normally. His concern was regarding his abdomen and legs.   PAST MEDICAL HISTORY :   has a past medical history of Adenocarcinoma of rectum ([place]); AICD (automatic cardioverter/defibrillator) present (2002); Anemia; CAD (coronary artery disease); CHF (congestive heart failure) ([place]); Cholelithiasis (06/2015); Chronic kidney disease, stage IV (severe) ([place]); Chronic systolic heart failure ([place]); Colostomy in place [facility]); Dizziness; DM type 2, uncontrolled, with renal complications ([place]); Dysrhythmia; Essential hypertension, benign; GERD (gastroesophageal reflux disease); HCAP (healthcare-associated pneumonia) (07/21/2015); Hematuria; History of blood transfusion; HLD (hyperlipidemia); Ischemic cardiomyopathy; Myocardial infarction ([place]) (2001); Obesity; Orthostatic hypotension; OSA on CPAP; Paroxysmal atrial fibrillation ([place]); PONV (postoperative nausea and vomiting); Prostate cancer ([place]); SAH (subarachnoid hemorrhage) ([place]); and TIA (transient ischemic attack).  has a past  surgical history that includes Cardiac defibrillator placement (2002); Abdominal and Perineal Resection of Rectum with Total Mesorectal Excision (10/04/2007); Colonoscopy (09/14/2011); Colostomy (2008); Colonoscopy (N/A, 07/02/2014); Esophagogastroduodenoscopy (N/A, 07/02/2014); Savory dilation (N/A, 07/02/2014); maloney dilation (N/A, 07/02/2014); Portacath placement (06/2007); left heart catheterization with coronary angiogram (N/A, 07/13/2013); Colonoscopy (N/A, 12/11/2014); Esophagogastroduodenoscopy (N/A, 12/11/2014); Cardiac defibrillator placement (2009); right heart catheterization (N/A, 02/24/2015); Cardiac catheterization (N/A, 04/10/2015); Bi-ventricular implantable cardioverter defibrillator  (crt-d) (2009); Cardiac catheterization (08/2001; 2009); Coronary angioplasty with stent (2001; ~ 2006); Givens capsule study (N/A, 07/23/2015); Colonoscopy (N/A, 08/24/2015); Cardiac catheterization (N/A, 08/28/2015); AV fistula placement (Right, 09/16/2015); ERCP (N/A, 06/11/2016); ERCP (N/A, 01/26/2017); Gastrointestinal stent removal (N/A, 01/26/2017); ERCP (06/2016); ERCP (01/2017); Esophagogastroduodenoscopy (egd) with propofol (N/A, 04/18/2017); and maloney dilation (N/A, 04/18/2017). Prior to Admission medications   Medication Sig Start Date End Date Taking? Authorizing Provider  atorvastatin (LIPITOR) 40 MG tablet Take 1 tablet (40 mg total) by mouth daily. Patient taking differently: Take 40 mg by mouth at bedtime.  02/15/17 07/10/17 Yes Bensimhon, Shaune Pascal, MD  Coenzyme Q10 (COQ10) 50 MG CAPS Take 1 capsule by mouth daily.   Yes [provider]  collagenase (SANTYL) ointment Apply topically daily. Patient taking differently: Apply topically every other day. At night 04/28/17  Yes Lavina Hamman, MD  ferrous sulfate 325 (65 FE) MG tablet Take 1 tablet (325 mg total) by mouth 3 (three) times daily with meals. 04/28/17  Yes Berle Mull M, MD  glucose blood test strip Use to test blood sugar 3 times daily 03/23/17  Yes  Elayne Snare, MD  insulin aspart (NOVOLOG FLEXPEN) 100 UNIT/ML FlexPen 5 units before each meal Patient taking differently: Inject 6 Units into the skin 3 (three) times daily with meals. 5 units before each meal 03/23/17  Yes  Elayne Snare, MD  metolazone (ZAROXOLYN) 2.5 MG tablet take 1 tablet by mouth weekly if needed for SWELLING 06/27/17  Yes Branch, Alphonse Guild, MD  metoprolol succinate (TOPROL-XL) 25 MG 24 hr tablet Take 0.5 tablets (12.5 mg total) by mouth 2 (two) times daily. 06/22/17  Yes Clegg, Amy D, NP  nitroGLYCERIN (NITROSTAT) 0.4 MG SL tablet Place 1 tablet (0.4 mg total) under the tongue every 5 (five) minutes as needed for chest pain. 06/10/17 09/08/17 Yes BranchAlphonse Guild, MD  pantoprazole (PROTONIX) 40 MG tablet Take 1 tablet (40 mg total) by mouth daily. 05/07/16  Yes Shirley Friar, PA-C  polyethylene glycol [facility] / GLYCOLAX) packet Take 17 g by mouth daily as needed for mild constipation. 04/28/17  Yes Lavina Hamman, MD  potassium chloride SA (K-DUR,KLOR-CON) 20 MEQ tablet Take 2 tablets (40 mEq total) by mouth daily. Patient taking differently: Take 20-40 mEq by mouth daily. Only takes 2 tablets when taking metolazone 06/21/17  Yes Clegg, Amy D, NP  PROAIR RESPICLICK 696 (90 BASE) MCG/ACT AEPB Inhale 1 puff into the lungs every 6 (six) hours as needed (for breathing).  08/15/15  Yes [provider]  torsemide (DEMADEX) 20 MG tablet TAKE 4 TABLETS IN THE MORNING AND 2 TABLET IN THE EVENING 06/21/17  Yes Clegg, Amy D, NP  warfarin (COUMADIN) 2 MG tablet Take 1 tablet daily except 1/2 tablet on Tuesdays and Fridays Patient taking differently: Take 1 tablet daily except 1/2 tablet on Tuesdays and Thursdays 05/26/17  Yes Bensimhon, Shaune Pascal, MD  Insulin Glargine (LANTUS SOLOSTAR) 100 UNIT/ML Solostar Pen Inject 15 Units into the skin daily at 10 pm. Patient not taking: Reported on 06/13/2017 04/13/17   Elayne Snare, MD   No Known Allergies  FAMILY HISTORY:  family history  includes Colon cancer in his other; Colon cancer (age of onset: 51) in his sister; Colon cancer (age of onset: 71) in his mother; Coronary artery disease in his father; Diabetes in his sister. SOCIAL HISTORY:  reports that he has never smoked. He has never used smokeless tobacco. He reports that he does not drink alcohol or use drugs.  REVIEW OF SYSTEMS:   Constitutional: Negative for fever, chills, weight loss, malaise/fatigue and diaphoresis.  HENT: Negative for hearing loss, ear pain, nosebleeds, congestion, sore throat, neck pain, tinnitus and ear discharge.   Eyes: Negative for blurred vision, double vision, photophobia, pain, discharge and redness.  Respiratory: Negative for cough, hemoptysis, sputum production, shortness of breath, wheezing and stridor.   Cardiovascular: Negative for chest pain, palpitations, orthopnea, claudication, leg swelling and PND.  Gastrointestinal: Negative for heartburn, nausea, vomiting, abdominal pain, diarrhea, constipation, blood in stool and melena.  Genitourinary: Negative for dysuria, urgency, frequency, hematuria and flank pain.  Musculoskeletal: Negative for myalgias, back pain, joint pain and falls.  Skin: Negative for itching and rash.  Neurological: Negative for dizziness, tingling, tremors, sensory change, speech change, focal weakness, seizures, loss of consciousness, weakness and headaches.  Endo/Heme/Allergies: Negative for environmental allergies and polydipsia. Does not bruise/bleed easily.  SUBJECTIVE:   VITAL SIGNS: Temp:  [97.8 F (36.6 C)] 97.8 F (36.6 C) (08/04 2132) Pulse Rate:  [70-77] 75 (08/05 0015) Resp:  [14-28] 14 (08/05 0015) BP: (76-93)/(41-56) 89/56 (08/05 0015) SpO2:  [99 %-100 %] 99 % (08/05 0015)  .Marland KitchenPhysical Exam  Constitutional: He is oriented to person, place, and time. No distress.  HENT:  Head: Normocephalic and atraumatic.  Eyes: Pupils are equal, round, and reactive to light. EOM are  normal. No scleral  icterus.  Neck: Normal range of motion. Neck supple.  Cardiovascular: Normal rate and intact distal pulses.  Exam reveals no friction rub.   S1 and S2 + S3  Pulmonary/Chest: Effort normal. He has rales.  bibasilar rales   Abdominal: He exhibits distension.  Decreased bowel sounds  Musculoskeletal: He exhibits edema.  Decreased ROM in right knee +3 pitting edema to thighs in bilateral lower ext  Neurological: He is alert and oriented to person, place, and time. No cranial nerve deficit.  Skin: He is not diaphoretic.  Excoriations noted on anterior chest, right side of neck and along arms    Recent Labs Lab 07/09/17 2132 07/09/17 2212  NA 124* 124*  K 3.7 3.7  CL 88* 89*  CO2 20*  --   BUN 106* 97*  CREATININE 3.75* 3.70*  GLUCOSE 282* 280*    Recent Labs Lab 07/09/17 2132 07/09/17 2212  HGB 8.2* 8.8*  HCT 24.9* 26.0*  WBC 18.1*  --   PLT 158  --    Dg Chest Portable 1 View  Result Date: 07/09/2017 CLINICAL DATA:  61 year old male with shortness of breath. EXAM: PORTABLE CHEST 1 VIEW COMPARISON:  Chest radiograph dated 06/13/2017 FINDINGS: There is a small right pleural effusion with right lung base airspace density which may represent atelectasis versus infiltrate. Overall the size of the pleural effusion and right lung base density appears relatively similar to prior radiograph. The left lung is clear. There is no pneumothorax. Stable cardiac silhouette. Left pectoral AICD device. No acute osseous pathology. IMPRESSION: Right pleural effusion and right lung base atelectasis versus infiltrate. Clinical correlation is recommended. Electronically Signed   By: Anner Crete M.D.   On: 07/09/2017 23:11    ASSESSMENT / RECOMMEDNDATIONS:   NEURO: No active issues at the time of my assessment Avoid sedative medications at this time  CARDIAC: HFrEF last documented at EF15-20% Pt received 30cc/kg for Sepsis bundle recommend de-resuscitation with diuretics Hyponatremia 124  - poor prognostic sign BNP 1553 Trop 0.09 Recommend Cardiology consult regarding ionotropic support  PULMONARY: Pulmonary edema with Right sided effusion Secondary to fluid overload from HFrEF Not requiring supplemental Oxygen  ID: Low suspicion for Sepsis LA- elevated however that may be secondary to hypoperfusion from poor CO Cultures:repeated blood cx x 2 and urine cx in ED Previous cx drawn on 06/11/16- negative Right base of lung on CXR- effusion Vancomycin and Zosyn ordered in ED for empiric coverage Given renal function strongly recommend renal dosing of antibiotics with Vanc level  Endocrine: TSH H/o insulin dependence Cortiosl level  GI: NPO When TF will start Prophylaxis indicated CTA/P  Heme: Compliant with Coumadin took last dose today INR 1.89 Current Hgb 8.2 If Hgb <8 evaluate for any active bleeding Weigh risks/beneift ratio of transfusion  RENAL Pt has a h/o dialysis in the past and improved Within last week pt reports poor UOP in response to his home does of Lasix Increase in serum creatinine of ?200% and UOP<0.56ml/kg/hr  RIFLE Loss /Stage IV AKIN/ KDIGO  Lab Results  Component Value Date   CREATININE 3.70 (H) 07/09/2017   CREATININE 3.75 (H) 07/09/2017   CREATININE 2.49 (H) 06/21/2017   Hypervolemic Hyponatremia  Secondary to Heart failure May be responsive to Tolvaptan Recommend Urine lytes and Osm Current GFR 18 Avoid nephrotoxic meds Consult Renal   Rheumatology C/o Right knee pain with decreased ROM ? GOUT May need arthrocentesis to analyze fluid  Rx with colchicine (renally dosed)  .  I, Dr Seward Carol have personally reviewed patient's available data, including medical history, events of note, physical examination and test results as part of my evaluation.  I have discussed the plan of care with the medical team (including consultants, NP/PA, RN, ED physician and E-link) and patient/patient's family members. He is AAOx4 in no  acute distress he does however require optimization of his heart failure and fluid status.  CODE STATUS: FULL DISPOSITION: ICU Family: wife at bedside. I discussed and explained current medical condition in detail   The patient is critically ill with multiple organ systems failure and requires high complexity decision making for assessment and support, frequent evaluation and titration of therapies, application of advanced monitoring technologies and extensive interpretation of multiple databases.  Critical Care Time devoted to patient care services described in this note is >40 Minutes. This time reflects time of care of this signee Dr Seward Carol. This critical care time does not reflect procedure time, or teaching time or supervisory time of PA/NP/Med student/Med Resident etc but could involve care discussion time    Dr. Seward Carol [facility]  07/10/2017 1:20 AM     Signed Dr Seward Carol [facility]   [facility] [place] Pager: [phone]  07/10/2017, 12:22 AM

## 2017-07-10 NOTE — Procedures (Signed)
Central Venous dialysis Catheter Insertion Procedure Note [name] [id number] 1956-07-16  Procedure: Insertion of Central Venous Catheter Indications: dialysis and IC access  Procedure Details Consent: Risks of procedure as well as the alternatives and risks of each were explained to the (patient/caregiver).  Consent for procedure obtained. Time Out: Verified patient identification, verified procedure, site/side was marked, verified correct patient position, special equipment/implants available, medications/allergies/relevent history reviewed, required imaging and test results available.  Performed Real time [place] used to ID and cannulate vessel Maximum sterile technique was used including antiseptics, cap, gloves, gown, hand hygiene, mask and sheet. Skin prep: Chlorhexidine; local anesthetic administered A antimicrobial bonded/coated triple lumen catheter was placed in the right internal jugular vein using the Seldinger technique.  Evaluation Blood flow good Complications: No apparent complications Patient did tolerate procedure well. Chest X-ray ordered to verify placement.  CXR: pending.  [name] 07/10/2017, 3:38 PM Erick Colace ACNP-BC [place] Pager # [phone] OR # [phone] if no answer

## 2017-07-10 NOTE — Progress Notes (Addendum)
    [place] for Infectious Disease    Date of Admission:  07/09/2017      ID: [name] is a 61 y.o. male with MSSA bacteremia, day 2 of vanco/piptazo Principal Problem:   Septic shock ([place]) Active Problems:   Ischemic cardiomyopathy-EF 35% 04/24/15 echo   Long term current use of anticoagulant therapy   CAD with LCX disease and stents to LAD   Chronic a-fib (HCC)   Chronic kidney disease, stage IV (severe) (HCC)   OSA on CPAP   Hyperlipidemia   BiV ICD (St Jude) gen change 04/10/15   Chronic atrial fibrillation (HCC)   Right heart failure (HCC)   Cardiorenal syndrome with renal failure   Blood cx from 8/4 growing MSSA in both sets  today he had insertion of CVC for venous access   Assessment/Plan: ID will see formally tomorrow.  In the meantime, recommend narrowing abtx to cefazolin, renally dosed  - plan to repeat blood cx tomorrow - need to remember that he had central line placed in setting of bacteremia and will need line holiday in the future to avoid possible nidus - will start rifampin at 300mg  PO Q 8hr until vegetation is ruled out - will discuss with coumadin -rif interaction with pharmacy - TTE did not show vegetation, would recommend TEE since he has AICD to rule out cardiac device infection  Dr Jeremiah Medal to provide further recs    [place], [facility] Cell: [phone] Pager: [phone]  07/10/2017, 4:36 PM

## 2017-07-11 ENCOUNTER — Inpatient Hospital Stay (HOSPITAL_COMMUNITY): Payer: Medicare Other

## 2017-07-11 ENCOUNTER — Encounter: Payer: Self-pay | Admitting: *Deleted

## 2017-07-11 DIAGNOSIS — T827XXA Infection and inflammatory reaction due to other cardiac and vascular devices, implants and grafts, initial encounter: Secondary | ICD-10-CM

## 2017-07-11 DIAGNOSIS — I2510 Atherosclerotic heart disease of native coronary artery without angina pectoris: Secondary | ICD-10-CM

## 2017-07-11 DIAGNOSIS — I482 Chronic atrial fibrillation: Secondary | ICD-10-CM

## 2017-07-11 DIAGNOSIS — R6521 Severe sepsis with septic shock: Secondary | ICD-10-CM

## 2017-07-11 DIAGNOSIS — I1310 Hypertensive heart and chronic kidney disease without heart failure, with stage 1 through stage 4 chronic kidney disease, or unspecified chronic kidney disease: Secondary | ICD-10-CM

## 2017-07-11 DIAGNOSIS — A419 Sepsis, unspecified organism: Secondary | ICD-10-CM

## 2017-07-11 DIAGNOSIS — A4101 Sepsis due to Methicillin susceptible Staphylococcus aureus: Secondary | ICD-10-CM | POA: Diagnosis present

## 2017-07-11 DIAGNOSIS — I214 Non-ST elevation (NSTEMI) myocardial infarction: Secondary | ICD-10-CM

## 2017-07-11 DIAGNOSIS — A4901 Methicillin susceptible Staphylococcus aureus infection, unspecified site: Secondary | ICD-10-CM | POA: Insufficient documentation

## 2017-07-11 DIAGNOSIS — M009 Pyogenic arthritis, unspecified: Secondary | ICD-10-CM

## 2017-07-11 DIAGNOSIS — Z95810 Presence of automatic (implantable) cardiac defibrillator: Secondary | ICD-10-CM

## 2017-07-11 DIAGNOSIS — N19 Unspecified kidney failure: Secondary | ICD-10-CM

## 2017-07-11 LAB — BASIC METABOLIC PANEL
ANION GAP: 15 (ref 5–15)
ANION GAP: 14 (ref 5–15)
BUN: 109 mg/dL — ABNORMAL HIGH (ref 6–20)
BUN: 109 mg/dL — AB (ref 6–20)
CHLORIDE: 90 mmol/L — AB (ref 101–111)
CHLORIDE: 90 mmol/L — AB (ref 101–111)
CO2: 19 mmol/L — AB (ref 22–32)
CO2: 17 mmol/L — ABNORMAL LOW (ref 22–32)
Calcium: 8.6 mg/dL — ABNORMAL LOW (ref 8.9–10.3)
Calcium: 8.4 mg/dL — ABNORMAL LOW (ref 8.9–10.3)
Creatinine, Ser: 3.69 mg/dL — ABNORMAL HIGH (ref 0.61–1.24)
Creatinine, Ser: 3.51 mg/dL — ABNORMAL HIGH (ref 0.61–1.24)
GFR calc Af Amer: 20 mL/min — ABNORMAL LOW (ref >60)
GFR calc non Af Amer: 16 mL/min — ABNORMAL LOW (ref >60)
GFR, EST AFRICAN AMERICAN: 19 mL/min — AB (ref >60)
GFR, EST NON AFRICAN AMERICAN: 17 mL/min — AB (ref >60)
Glucose, Bld: 263 mg/dL — ABNORMAL HIGH (ref 65–99)
Glucose, Bld: 309 mg/dL — ABNORMAL HIGH (ref 65–99)
POTASSIUM: 3.6 mmol/L (ref 3.5–5.1)
Potassium: 3.7 mmol/L (ref 3.5–5.1)
SODIUM: 121 mmol/L — AB (ref 135–145)
Sodium: 124 mmol/L — ABNORMAL LOW (ref 135–145)

## 2017-07-11 LAB — CBC
HCT: 24.4 % — ABNORMAL LOW (ref 39.0–52.0)
Hemoglobin: 8.1 g/dL — ABNORMAL LOW (ref 13.0–17.0)
MCH: 30.2 pg (ref 26.0–34.0)
MCHC: 33.2 g/dL (ref 30.0–36.0)
MCV: 91 fL (ref 78.0–100.0)
PLATELETS: 193 10*3/uL (ref 150–400)
RBC: 2.68 MIL/uL — AB (ref 4.22–5.81)
RDW: 15.7 % — ABNORMAL HIGH (ref 11.5–15.5)
WBC: 31.2 10*3/uL — AB (ref 4.0–10.5)

## 2017-07-11 LAB — GLUCOSE, CAPILLARY
GLUCOSE-CAPILLARY: 300 mg/dL — AB (ref 65–99)
Glucose-Capillary: 292 mg/dL — ABNORMAL HIGH (ref 65–99)
Glucose-Capillary: 296 mg/dL — ABNORMAL HIGH (ref 65–99)
Glucose-Capillary: 292 mg/dL — ABNORMAL HIGH (ref 65–99)

## 2017-07-11 LAB — PROCALCITONIN: Procalcitonin: 3.97 ng/mL

## 2017-07-11 LAB — COOXEMETRY PANEL
CARBOXYHEMOGLOBIN: 1.9 % — AB (ref 0.5–1.5)
Methemoglobin: 1.4 % (ref 0.0–1.5)
O2 SAT: 62.6 %
Total hemoglobin: 7.8 g/dL — ABNORMAL LOW (ref 12.0–16.0)

## 2017-07-11 LAB — POCT I-STAT 3, ART BLOOD GAS (G3+)
ACID-BASE DEFICIT: 4 mmol/L — AB (ref 0.0–2.0)
Bicarbonate: 18.2 mmol/L — ABNORMAL LOW (ref 20.0–28.0)
O2 SAT: 93 %
PO2 ART: 60 mmHg — AB (ref 83.0–108.0)
Patient temperature: 97.8
TCO2: 19 mmol/L (ref 0–100)
pCO2 arterial: 24.9 mmHg — ABNORMAL LOW (ref 32.0–48.0)
pH, Arterial: 7.47 — ABNORMAL HIGH (ref 7.350–7.450)

## 2017-07-11 LAB — URINE CULTURE: Culture: NO GROWTH

## 2017-07-11 LAB — PROTIME-INR
INR: 2.21
Prothrombin Time: 24.9 seconds — ABNORMAL HIGH (ref 11.4–15.2)

## 2017-07-11 LAB — LACTIC ACID, PLASMA: Lactic Acid, Venous: 3.2 mmol/L (ref 0.5–1.9)

## 2017-07-11 LAB — MAGNESIUM: Magnesium: 2.2 mg/dL (ref 1.7–2.4)

## 2017-07-11 MED ORDER — PRISMASOL BGK 4/2.5 32-4-2.5 MEQ/L IV SOLN
INTRAVENOUS | Status: DC
  Administered 2017-07-11 – 2017-07-20 (×15): via INTRAVENOUS_CENTRAL
  Filled 2017-07-11 (×23): qty 5000

## 2017-07-11 MED ORDER — ASPIRIN 325 MG PO TABS
325.0000 mg | ORAL_TABLET | Freq: Every day | ORAL | Status: DC
  Administered 2017-07-11 – 2017-07-12 (×2): 325 mg via ORAL
  Filled 2017-07-11 (×2): qty 1

## 2017-07-11 MED ORDER — FUROSEMIDE 10 MG/ML IJ SOLN
20.0000 mg/h | INTRAVENOUS | Status: DC
  Administered 2017-07-11: 20 mg/h via INTRAVENOUS
  Filled 2017-07-11 (×3): qty 25

## 2017-07-11 MED ORDER — PRISMASOL BGK 4/2.5 32-4-2.5 MEQ/L IV SOLN
INTRAVENOUS | Status: DC
  Administered 2017-07-11 – 2017-07-20 (×9): via INTRAVENOUS_CENTRAL
  Filled 2017-07-11 (×16): qty 5000

## 2017-07-11 MED ORDER — HEPARIN (PORCINE) 2000 UNITS/L FOR CRRT
INTRAVENOUS_CENTRAL | Status: DC | PRN
  Administered 2017-07-17: 999 mL via INTRAVENOUS_CENTRAL
  Filled 2017-07-11: qty 1000

## 2017-07-11 MED ORDER — WARFARIN SODIUM 2 MG PO TABS: 2.0000 mg | ORAL_TABLET | Freq: Once | ORAL | Status: DC

## 2017-07-11 MED ORDER — PRISMASOL BGK 4/2.5 32-4-2.5 MEQ/L IV SOLN
INTRAVENOUS | Status: DC
  Administered 2017-07-11 – 2017-07-20 (×40): via INTRAVENOUS_CENTRAL
  Filled 2017-07-11 (×72): qty 5000

## 2017-07-11 MED ORDER — ACETAMINOPHEN 325 MG PO TABS: 650.0000 mg | ORAL_TABLET | ORAL | Status: DC | PRN

## 2017-07-11 MED ORDER — CHLORHEXIDINE GLUCONATE CLOTH 2 % EX PADS
6.0000 | MEDICATED_PAD | Freq: Every day | CUTANEOUS | Status: DC
  Administered 2017-07-11 – 2017-07-24 (×14): 6 via TOPICAL

## 2017-07-11 MED ORDER — ALUM & MAG HYDROXIDE-SIMETH 200-200-20 MG/5ML PO SUSP
15.0000 mL | Freq: Four times a day (QID) | ORAL | Status: DC | PRN
  Administered 2017-07-11 – 2017-07-13 (×2): 15 mL via ORAL
  Filled 2017-07-11 (×2): qty 30

## 2017-07-11 MED ORDER — POTASSIUM CHLORIDE CRYS ER 20 MEQ PO TBCR
40.0000 meq | EXTENDED_RELEASE_TABLET | Freq: Once | ORAL | Status: AC
  Administered 2017-07-11: 40 meq via ORAL
  Filled 2017-07-11: qty 2

## 2017-07-11 MED ORDER — MAGIC MOUTHWASH W/LIDOCAINE
5.0000 mL | Freq: Three times a day (TID) | ORAL | Status: DC | PRN
  Administered 2017-07-11: 5 mL via ORAL
  Filled 2017-07-11 (×3): qty 5

## 2017-07-11 MED ORDER — HEPARIN SODIUM (PORCINE) 1000 UNIT/ML DIALYSIS
1000.0000 [IU] | INTRAMUSCULAR | Status: DC | PRN
  Administered 2017-07-20: 2000 [IU] via INTRAVENOUS_CENTRAL
  Filled 2017-07-11 (×2): qty 6

## 2017-07-11 MED ORDER — SODIUM CHLORIDE 0.9% FLUSH
10.0000 mL | INTRAVENOUS | Status: DC | PRN
  Administered 2017-07-21 – 2017-07-27 (×2): 10 mL
  Filled 2017-07-11 (×2): qty 40

## 2017-07-11 MED ORDER — ONDANSETRON HCL 4 MG/2ML IJ SOLN
4.0000 mg | Freq: Four times a day (QID) | INTRAMUSCULAR | Status: DC | PRN
  Administered 2017-07-15 – 2017-08-01 (×2): 4 mg via INTRAVENOUS
  Filled 2017-07-11 (×2): qty 2

## 2017-07-11 MED ORDER — SODIUM CHLORIDE 0.9% FLUSH
10.0000 mL | Freq: Two times a day (BID) | INTRAVENOUS | Status: DC
  Administered 2017-07-11 – 2017-07-30 (×28): 10 mL
  Administered 2017-07-30: 40 mL
  Administered 2017-07-31: 10 mL
  Administered 2017-07-31 – 2017-08-01 (×2): 20 mL
  Administered 2017-08-01 – 2017-08-02 (×2): 10 mL

## 2017-07-11 NOTE — Care Management Note (Signed)
Case Management Note Marvetta Gibbons RN, BSN Unit 4E-Case Manager-- [place] coverage [phone]  Patient Details  Name: [name] MRN: [id number] Date of Birth: 1956-06-18  Subjective/Objective:  Pt admitted with HF                  Action/Plan: PTA pt lived at home with wife- independent, has PCP and insurance with drug coverage-no difficulty affording medications.  per last CM note- referral was done with [facility] for emmi f/u calls. CM to follow for d/c needs  Expected Discharge Date:                  Expected Discharge Plan:     In-House Referral:     Discharge planning Services     Post Acute Care Choice:    Choice offered to:     DME Arranged:    DME Agency:     HH Arranged:    HH Agency:     Status of Service:     If discussed at [employer] of Stay Meetings, dates discussed:    Discharge Disposition:   Additional Comments:  Dawayne Patricia, RN 07/11/2017, 10:49 AM

## 2017-07-11 NOTE — Progress Notes (Signed)
PULMONARY / CRITICAL CARE MEDICINE   Name: [name] MRN: [id number] DOB: 07/16/1956    ADMISSION DATE:  07/09/2017 CONSULTATION DATE:  07/10/2017  REFERRING MD:  Sarajane Jews  CHIEF COMPLAINT:  dyspnea  HISTORY OF PRESENT ILLNESS:   61 y/o male with a history of ischecmic cardiomyopathy s/p ICD and CKD was admitted on 8/4 with acute respiratory failure due to pulmonary edema.  Noted on 8/5 to have MSSA bacteremia.  He has been in shock requiring levophed.   REVIEW OF SYSTEMS:   Gen: feels weak, no fever, no chills PULM: some dyspnea this morning, no cough, no mucus production CV: no chest pain  SUBJECTIVE:  AS above  VITAL SIGNS: BP (!) 130/113   Pulse 69   Temp 97.7 F (36.5 C) (Oral)   Resp (!) 21   Ht 5\' 10"  (1.778 m)   Wt 90.6 kg (199 lb 11.8 oz)   SpO2 99%   BMI 28.66 kg/m   HEMODYNAMICS: CVP:  [16 mmHg-35 mmHg] 35 mmHg  VENTILATOR SETTINGS:    INTAKE / OUTPUT: I/O last 3 completed shifts: In: 7371 [P.O.:240; I.V.:524; IV Piggyback:3130] Out: 062 [Urine:735]  PHYSICAL EXAMINATION:  General:  Chronically ill appearing, weak, resting comfortably in bed HENT: NCAT OP clear PULM: Crackles bases B, normal effort CV: RRR, slight systolic mumrmur GI: BS+, soft, nontender Derm: edema legs bialterally, RUE fistula with good thrill but no surrounding redness or warmth Neuro: awake, alert, no distress, MAEW   LABS:  BMET  Recent Labs Lab 07/10/17 1115 07/10/17 1947 07/11/17 0335  NA 126* 124* 124*  K 3.8 3.5 3.7  CL 91* 89* 90*  CO2 16* 20* 19*  BUN 104* 106* 109*  CREATININE 3.68* 3.68* 3.69*  GLUCOSE 252* 268* 263*    Electrolytes  Recent Labs Lab 07/09/17 2132 07/10/17 0200 07/10/17 1115 07/10/17 1947 07/11/17 0335  CALCIUM 8.7*  --  8.3* 8.4* 8.6*  MG 2.3  --   --   --  2.2  PHOS  --  4.6  --   --   --     CBC  Recent Labs Lab 07/09/17 2132 07/09/17 2212 07/10/17 1115 07/11/17 0335  WBC 18.1*  --  28.0* 31.2*  HGB 8.2*  8.8* 8.3* 8.1*  HCT 24.9* 26.0* 25.2* 24.4*  PLT 158  --  175 193    Coag's  Recent Labs Lab 07/09/17 2132 07/10/17 1115 07/11/17 0335  INR 1.89 2.12 2.21    Sepsis Markers  Recent Labs Lab 07/10/17 0427 07/10/17 1115 07/11/17 0335  LATICACIDVEN 4.7* 3.9* 3.2*  PROCALCITON  --  3.96 3.97    ABG  Recent Labs Lab 07/11/17 0355  PHART 7.470*  PCO2ART 24.9*  PO2ART 60.0*    Liver Enzymes  Recent Labs Lab 07/09/17 2132 07/10/17 1115  AST 45* 47*  ALT 14* 16*  ALKPHOS 98 96  BILITOT 2.5* 2.8*  ALBUMIN 2.2* 2.1*    Cardiac Enzymes  Recent Labs Lab 07/10/17 0200 07/10/17 1115 07/10/17 1800  TROPONINI 0.11* 1.05* 41.84*    Glucose  Recent Labs Lab 07/10/17 0843 07/10/17 1150 07/10/17 1619 07/10/17 2153 07/11/17 0818  GLUCAP 246* 241* 244* 264* 292*    Imaging Ct Abdomen Pelvis Wo Contrast  Result Date: 07/10/2017 CLINICAL DATA:  Abdominal pain. Swelling. Assess for diverticulitis, hematoma or abscess. History of cirrhosis, diabetes, colostomy and rectal cancer, prostate cancer, anemia. EXAM: CT ABDOMEN AND PELVIS WITHOUT CONTRAST TECHNIQUE: Multidetector CT imaging of the abdomen and pelvis was performed following  the standard protocol without IV contrast. COMPARISON:  CT abdomen and pelvis May 05, 2017 FINDINGS: LOWER CHEST: Moderate RIGHT pleural effusion, small LEFT pleural effusion. The heart is mildly enlarged. Severe coronary artery calcifications and/or stents. Cardiac pacemaker wires in place. Bilateral lower lobe atelectasis. Punctate granulomas RIGHT lower lobe. HEPATOBILIARY: Mildly nodular liver contour compatible with history of cirrhosis. Subcentimeter gallstones, contracted gallbladder. PANCREAS: Normal. No intraperitoneal free air. SPLEEN: Normal. ADRENALS/URINARY TRACT: Kidneys are orthotopic, demonstrating normal size and morphology. 3 mm LEFT upper pole, 3 mm RIGHT interpolar nephrolithiasis. Additional punctate nephrolithiasis. No  hydronephrosis; limited assessment for renal masses on this nonenhanced examination. The unopacified ureters are normal in course and caliber. Urinary bladder is decompressed containing a Foley catheter. Normal adrenal glands. STOMACH/BOWEL: Similarly thickened rectum with presacral soft tissue stranding. The stomach, small bowel are normal in course and caliber without inflammatory changes, sensitivity decreased by lack of enteric contrast. LEFT lower quadrant diverting colostomy. Normal appendix. VASCULAR/LYMPHATIC: Aortoiliac vessels are normal in course and caliber. Moderate aortic atherosclerosis. No lymphadenopathy by CT size criteria. REPRODUCTIVE: Prostate brachia therapy seeds. OTHER: Moderate volume ascites. MUSCULOSKELETAL: Anasarca. Sacroiliac ankylosis. Anterior abdominal wall scarring. Similar sacral sclerosis pre ring post radiation change. IMPRESSION: 1. Cirrhosis.  Moderate ascites.  Anasarca. 2. Diverting colostomy without bowel obstruction. Similar thickened rectum compatible with treatment related changes. 3. Mild cardiomegaly. Moderate RIGHT and small LEFT pleural effusions. Aortic Atherosclerosis (ICD10-I70.0). Electronically Signed   By: Elon Alas M.D.   On: 07/10/2017 19:42   Dg Chest Port 1 View  Result Date: 07/11/2017 CLINICAL DATA:  Short of breath EXAM: PORTABLE CHEST 1 VIEW COMPARISON:  07/10/2017 FINDINGS: Tubular devices are stable. Left subclavian AICD device with leads is stable. Opacity throughout the right lung dx secondary to pleural and parenchymal disease is stable. Left lung is relatively clear would low volumes. No pneumothorax. IMPRESSION: Stable pleural and parenchymal disease in the right lung. Electronically Signed   By: Marybelle Killings M.D.   On: 07/11/2017 07:23   Dg Chest Port 1 View  Result Date: 07/10/2017 CLINICAL DATA:  60 year old male central line placement. EXAM: PORTABLE CHEST 1 VIEW COMPARISON:  07/09/2017 and earlier. FINDINGS: Portable AP semi  upright view at 1601 hours. Right IJ approach dual lumen central venous catheter placed, and courses along side the left subclavian approach cardiac AICD leads. Catheter tips are probably at the level of the carina. No pneumothorax. Continued veiling opacity in the right lung. Stable cardiac size and mediastinal contours. Visualized tracheal air column is within normal limits. Stable and satisfactory left lung ventilation. IMPRESSION: 1. Dual-lumen right IJ central catheter tips partially obscured by the AICD leads but at the SVC level. 2. Continued right pleural effusion. No new cardiopulmonary abnormality. Electronically Signed   By: Genevie Ann M.D.   On: 07/10/2017 16:16     STUDIES:  07/10/2017 Echo: LVEF 15-20%, severe TR, PA press 80's, no veg seen on valves  CULTURES: 8/4 blood > staph  ANTIBIOTICS: 8/6 cefazolin >  8/6 rifampin >  8/5 vanc >   SIGNIFICANT EVENTS:   LINES/TUBES: 8/5 RIJ HD cath >   DISCUSSION: 61 y/o male with a past medical history significant for CAD, ischemic cardiomyopathy admitted with MSSA bacteremia and demand ischemia associated shock. Now with acute on chronic renal failure.    ASSESSMENT / PLAN:  PULMONARY A: Acute dyspnea/mild hypoxemia due to acute pulmonary edema OSA P:   Continue lasix Contiue O2 to maintain O2 saturation > 90% CVVHD? Nocturnal CPAP  CARDIOVASCULAR A:  Ischemic cardiomyopathy Shock: mixed picture septic and cardiogenic Elevated PA pressure, likely due to volume overload ICD in place with staph bacteremia Demand ischemia, troponin 40 Paroxysmal afib Known CAD P:  Tele Will need TEE Warfarin per pharmacy Add ASA  RENAL A:   Acute on chronic renal failure R arm fistula in place P:   Monitor BMET and UOP Replace electrolytes as needed Renal consult for CVVHD  GASTROINTESTINAL A:   History of colostomy/rectal cancer P:   NPO for now given possibility of TEE  HEMATOLOGIC A:   Anemia without bleeding P:   Monitor for bleeding Continue warfarin for now  INFECTIOUS A:   MSSA bacteremia with ICD in place, HD cath in place (placed 8/5) P:   Antibiotics per ID Will need TEE Will need line holiday Repeat blood cultures per ID  ENDOCRINE A:   Hyperglycemia P:   SSI  NEUROLOGIC A:   No acute issues P:   Monitor neuro status closely   FAMILY  - Updates: wife updated bedside 8/6   My cc time 40 minutes  Roselie Awkward, MD [facility] Pager: [phone] Cell: [phone] After 3pm or if no response, call [phone]   07/11/2017, 11:52 AM

## 2017-07-11 NOTE — Progress Notes (Signed)
  Pt more short of breath.  Will try on Bipap.  Chesley Mires, MD [facility] Pulmonary/Critical Care 07/11/2017, 4:02 AM

## 2017-07-11 NOTE — Progress Notes (Signed)
Patient ID: [name], male   DOB: [birth year], 61 y.o.   MRN: [id number]     Advanced Heart Failure Rounding Note  Primary Cardiologist: Sendy Pluta  Subjective:    Admitted with fatigue, dyspnea, recurrent lower extremity swelling and decreased UOP.   Blood CX- 2/2 MSSA ? From LLE wounds. WBC up to 31.   Complains of fatigue.   Objective:   Weight Range: 199 lb 11.8 oz (90.6 kg) Body mass index is 28.66 kg/m.   Vital Signs:   Temp:  [97.3 F (36.3 C)-97.8 F (36.6 C)] 97.7 F (36.5 C) (08/06 0817) Pulse Rate:  [66-80] 73 (08/06 0817) Resp:  [13-25] 18 (08/06 0817) BP: (89-135)/(42-99) 104/43 (08/06 0817) SpO2:  [93 %-100 %] 99 % (08/06 0817) Weight:  [199 lb 11.8 oz (90.6 kg)] 199 lb 11.8 oz (90.6 kg) (08/06 0530) Last BM Date: 07/10/17  Weight change: Filed Weights   07/10/17 0300 07/11/17 0530  Weight: 199 lb 15.3 oz (90.7 kg) 199 lb 11.8 oz (90.6 kg)    Intake/Output:   Intake/Output Summary (Last 24 hours) at 07/11/17 0915 Last data filed at 07/11/17 0800  Gross per 24 hour  Intake          1155.91 ml  Output              870 ml  Net           285.91 ml      Physical Exam   CVP 25 General:  Appears chronically ill.  No resp difficulty. Pale  HEENT: normal Neck: supple. JVP to ear.  Carotids 2+ bilat; no bruits. No lymphadenopathy or thryomegaly appreciated. RIJ  Cor: PMI nondisplaced. Regular rate & rhythm. No rubs, gallops or murmurs. Lungs: Crackles RLL LLL  Abdomen: soft, nontender, + distended. No hepatosplenomegaly. No bruits or masses. Good bowel sounds. LLQ colostomy  Extremities: no cyanosis, clubbing, rash, edema. R and LLE SCDs. RUE AV fistula R hip painful with limited ROM Neuro: alert & orientedx3, cranial nerves grossly intact. moves all 4 extremities w/o difficulty. Affect pleasant GU:  Foley - small amount of urine Skin: LLE - partial thickness wound - pale pink    Telemetry   Personlly reviewed. A fib Biv 70s .   Labs     CBC  Recent Labs  07/09/17 2132  07/10/17 1115 07/11/17 0335  WBC 18.1*  --  28.0* 31.2*  NEUTROABS 16.8*  --  26.0*  --   HGB 8.2*  < > 8.3* 8.1*  HCT 24.9*  < > 25.2* 24.4*  MCV 92.6  --  92.6 91.0  PLT 158  --  175 193  < > = values in this interval not displayed. Basic Metabolic Panel  Recent Labs  07/09/17 2132  07/10/17 0200  07/10/17 1947 07/11/17 0335  NA 124*  < >  --   < > 124* 124*  K 3.7  < >  --   < > 3.5 3.7  CL 88*  < >  --   < > 89* 90*  CO2 20*  --   --   < > 20* 19*  GLUCOSE 282*  < >  --   < > 268* 263*  BUN 106*  < >  --   < > 106* 109*  CREATININE 3.75*  < >  --   < > 3.68* 3.69*  CALCIUM 8.7*  --   --   < > 8.4* 8.6*  MG 2.3  --   --   --   --  2.2  PHOS  --   --  4.6  --   --   --   < > = values in this interval not displayed. Liver Function Tests  Recent Labs  07/09/17 2132 07/10/17 1115  AST 45* 47*  ALT 14* 16*  ALKPHOS 98 96  BILITOT 2.5* 2.8*  PROT 6.4* 6.4*  ALBUMIN 2.2* 2.1*   No results for input(s): LIPASE, AMYLASE in the last 72 hours. Cardiac Enzymes  Recent Labs  07/10/17 0200 07/10/17 1115 07/10/17 1800  TROPONINI 0.11* 1.05* 41.84*    BNP: BNP (last 3 results)  Recent Labs  06/13/17 1205 07/09/17 2151  BNP 1,151.0* 1,553.9*    ProBNP (last 3 results) No results for input(s): PROBNP in the last 8760 hours.   D-Dimer No results for input(s): DDIMER in the last 72 hours. Hemoglobin A1C No results for input(s): HGBA1C in the last 72 hours. Fasting Lipid Panel No results for input(s): CHOL, HDL, LDLCALC, TRIG, CHOLHDL, LDLDIRECT in the last 72 hours. Thyroid Function Tests No results for input(s): TSH, T4TOTAL, T3FREE, THYROIDAB in the last 72 hours.  Invalid input(s): FREET3  Other results:   Imaging    Ct Abdomen Pelvis Wo Contrast  Result Date: 07/10/2017 CLINICAL DATA:  Abdominal pain. Swelling. Assess for diverticulitis, hematoma or abscess. History of cirrhosis, diabetes, colostomy and  rectal cancer, prostate cancer, anemia. EXAM: CT ABDOMEN AND PELVIS WITHOUT CONTRAST TECHNIQUE: Multidetector CT imaging of the abdomen and pelvis was performed following the standard protocol without IV contrast. COMPARISON:  CT abdomen and pelvis May 05, 2017 FINDINGS: LOWER CHEST: Moderate RIGHT pleural effusion, small LEFT pleural effusion. The heart is mildly enlarged. Severe coronary artery calcifications and/or stents. Cardiac pacemaker wires in place. Bilateral lower lobe atelectasis. Punctate granulomas RIGHT lower lobe. HEPATOBILIARY: Mildly nodular liver contour compatible with history of cirrhosis. Subcentimeter gallstones, contracted gallbladder. PANCREAS: Normal. No intraperitoneal free air. SPLEEN: Normal. ADRENALS/URINARY TRACT: Kidneys are orthotopic, demonstrating normal size and morphology. 3 mm LEFT upper pole, 3 mm RIGHT interpolar nephrolithiasis. Additional punctate nephrolithiasis. No hydronephrosis; limited assessment for renal masses on this nonenhanced examination. The unopacified ureters are normal in course and caliber. Urinary bladder is decompressed containing a Foley catheter. Normal adrenal glands. STOMACH/BOWEL: Similarly thickened rectum with presacral soft tissue stranding. The stomach, small bowel are normal in course and caliber without inflammatory changes, sensitivity decreased by lack of enteric contrast. LEFT lower quadrant diverting colostomy. Normal appendix. VASCULAR/LYMPHATIC: Aortoiliac vessels are normal in course and caliber. Moderate aortic atherosclerosis. No lymphadenopathy by CT size criteria. REPRODUCTIVE: Prostate brachia therapy seeds. OTHER: Moderate volume ascites. MUSCULOSKELETAL: Anasarca. Sacroiliac ankylosis. Anterior abdominal wall scarring. Similar sacral sclerosis pre ring post radiation change. IMPRESSION: 1. Cirrhosis.  Moderate ascites.  Anasarca. 2. Diverting colostomy without bowel obstruction. Similar thickened rectum compatible with treatment  related changes. 3. Mild cardiomegaly. Moderate RIGHT and small LEFT pleural effusions. Aortic Atherosclerosis (ICD10-I70.0). Electronically Signed   By: Awilda Metro M.D.   On: 07/10/2017 19:42   Dg Chest Port 1 View  Result Date: 07/11/2017 CLINICAL DATA:  Short of breath EXAM: PORTABLE CHEST 1 VIEW COMPARISON:  07/10/2017 FINDINGS: Tubular devices are stable. Left subclavian AICD device with leads is stable. Opacity throughout the right lung dx secondary to pleural and parenchymal disease is stable. Left lung is relatively clear would low volumes. No pneumothorax. IMPRESSION: Stable pleural and parenchymal disease in the right lung. Electronically Signed   By: Jolaine Click M.D.   On: 07/11/2017 07:23   Dg  Chest Port 1 View  Result Date: 07/10/2017 CLINICAL DATA:  61 year old male central line placement. EXAM: PORTABLE CHEST 1 VIEW COMPARISON:  07/09/2017 and earlier. FINDINGS: Portable AP semi upright view at 1601 hours. Right IJ approach dual lumen central venous catheter placed, and courses along side the left subclavian approach cardiac AICD leads. Catheter tips are probably at the level of the carina. No pneumothorax. Continued veiling opacity in the right lung. Stable cardiac size and mediastinal contours. Visualized tracheal air column is within normal limits. Stable and satisfactory left lung ventilation. IMPRESSION: 1. Dual-lumen right IJ central catheter tips partially obscured by the AICD leads but at the SVC level. 2. Continued right pleural effusion. No new cardiopulmonary abnormality. Electronically Signed   By: Odessa Fleming M.D.   On: 07/10/2017 16:16     Medications:     Scheduled Medications: . atorvastatin  40 mg Oral q1800  . Chlorhexidine Gluconate Cloth  6 each Topical Daily  . colchicine  0.3 mg Oral Daily  . insulin aspart  0-9 Units Subcutaneous TID WC  . sodium chloride flush  10-40 mL Intracatheter Q12H  . Warfarin - Pharmacist Dosing Inpatient   Does not apply q1800     Infusions: . sodium chloride 250 mL (07/10/17 1900)  .  ceFAZolin (ANCEF) IV Stopped (07/11/17 0603)  . furosemide (LASIX) infusion 10 mg/hr (07/11/17 0600)  . norepinephrine (LEVOPHED) Adult infusion 10 mcg/min (07/11/17 0600)  . rifampin (RIFADIN) IVPB Stopped (07/11/17 0233)    PRN Medications: sodium chloride, acetaminophen, alum & mag hydroxide-simeth, fentaNYL (SUBLIMAZE) injection, heparin, ondansetron (ZOFRAN) IV, sodium chloride flush    Patient Profile   [name] is a 61 year old with a history of DMII, SAH 2016, rectal cancer, CKD Stage IV, chronic A fib, S/P AVN ablation, PAF, CAD BMS LAD 2011 BMS LAD 2009, OSA, chronic systolic heart failure, St Jude BiV ICD admitted with AKI and shock with elevated lactate, suspect cardiogenic.   Assessment/Plan   1. Acute on chronic systolic CHF/cardiogenic shock: Last echo in 8/17 with EF 15-20%.  Ischemic cardiomyopathy, has St Jude BiV ICD (despite chronic afib appears to be effectively BiV pacing).  Had recent admission (discharged 06/21/17) requiring milrinone support and diuresis.  Admitted with soft BP (SBP 80s-90s) + elevated lactate.  Some concern initially for PNA/septic shock, but suspect CXR shows R effusion rather than PNA, no fever.   - CVP 25--> increase lasix drip to 20 mg per hour.  -On norepi 10 mcg  CO-OX 63%.   2. AKI on CKD stage 4: Baseline creatinine around 2.5.  Creatinine unchanged 3.7   He required brief HD in the past, has RUE fistula. - May end up needing CVVH, will need nephrology to see. 3. Atrial fibrillation: Chronic.  On warfarin, INR  2.21 today.  Warfarin per pharmacy.   4. CAD: No CP. He can continue home statin.  5. ID: WBCs trending up to 31.   Covering with vanc/Zosyn for PNA given initial concern for septic shock, but suspect R-sided pleural effusion and cardiogenic shock. Blood CX 2/2 MSSA. Antibiotics narrowed rifampin and ancef. Has BiV.   6. H/O Rectal CA with diverting colostomy  7. Type  II diabetes: Sliding scale.    Length of Stay: 1  Amy Clegg, NP  07/11/2017, 9:15 AM  Advanced Heart Failure Team Pager [phone] (M-F; 7a - 4p)  Please contact [facility] for night-coverage after hours (4p -7a ) and weekends on [email or web address]  Agree with above.  He  is critically ill with multiple active issues including:  1) MSSA bacteremia 2) Severe volume overload 3) Mixed septic/cardiogenic shock requiring norepinephrine support 4) NSTEMI with trop 41 5) Acute on chronic renal failure with creatine ~4 6) Painful R hip (? Osteo)  On exam. Lethargic and ill appearing but conversant JVP to ear Cor RRR +s3 Lungs + basilar crackles Ab distended NT + colostomy Ext 2+ edema with wounds Painful R hip  Will continue aggressive care for now including NE support. IV diuresis and antibiotics per ID. Await results of hip CT.   If improving, will need ICD removal and TEE to evaluate valves. If not improving, low threshold to switch to comfort care.   CRITICAL CARE Performed by: Arvilla Meres  Total critical care time: 40 minutes  Critical care time was exclusive of separately billable procedures and treating other patients.  Critical care was necessary to treat or prevent imminent or life-threatening deterioration.  Critical care was time spent personally by me (independent of midlevel providers or residents) on the following activities: development of treatment plan with patient and/or surrogate as well as nursing, discussions with consultants, evaluation of patient's response to treatment, examination of patient, obtaining history from patient or surrogate, ordering and performing treatments and interventions, ordering and review of laboratory studies, ordering and review of radiographic studies, pulse oximetry and re-evaluation of patient's condition.  Arvilla Meres, MD  8:52 PM

## 2017-07-11 NOTE — Progress Notes (Signed)
Spoke with Dr. Harrell Gave (cardiology) regarding new onset "severe" indigestion. No EKG changes seen, denies chest pain. Will administer PRN Maalox per order. Richarda Blade RN

## 2017-07-11 NOTE — Progress Notes (Signed)
Patient called out and states that he is in 6/10 chest pain that is new onset. He describes it as a "heavy pressure" that is different from the indigestion he was experiencing earlier this morning. EKG completed. PA Barrett made aware of new onset chest pain. Will continue to closely monitor. Richarda Blade RN

## 2017-07-11 NOTE — Consult Note (Signed)
[facility] Admit Date: 07/09/2017 07/11/2017 Einar Gip, DO  [facility] Consultation Note Primary Nephrologist: Dr. Hinda Lenis Requesting Physician:  Lake Bells, MD Reason for Consult:  Acute on Chronic Renal Failure  HPI:   61 y/o M with extensive medical history including HFrEF AICD placement (most recent LVEF 15-20% 8/5), advanced CKD with RUE AVF placement, CAD with hx of MI and stents, Chronic atrial fibrillation, type 2 diabetes, cirrhosis and history of rectal cancer admitted 07/09/17 with acute on chronic respiratory failure, demand ischemia (Trop 40) and signs of volume overload. He presented in shock, felt to be mixed cardiogenic and septic. He was found to have MSSA bacteremia on admission blood cultures. Diuresis was attempted with high-dose IV lasix however had poor UOP. Nephrology was consulted for possible CRRT. He remains in shock on levophed. On admission CMET showed sodium of 124, potassium 3.7, chloride 89, bicarb 20, BUN 106 and Cr 3.75 (BL Cr ~2.6). CBC with leukocytosis to 18.1, normocytic anemia with Hb of 8.2. UOP has trailed off despite lasix drip   He was recently admitted 7/9-7/17 for volume overload while on his home dose of torsemide 80mg  Qam and 40mg  Qpm. He was diuresed with IV lasix, and metolazone and was also on IV milrinone until day of discharge (sent out on his usual home dose of torsemide). Within a week he developed SOB, worsening edema and in the interim since hospital discharge has gained close to 30 lb. Creatinine 2.5 on 7/17, mid to high 3's since admission.  He sees Dr. Hinda Lenis as an outpatient and required HD last year from July until November 2017 for AKI in CKD 2/2 ischemic ATN.   NSAIDS: No recent IV Contrast No recent TMP/SMX No recent Hypotension YES, current  Creatinine summary for reference Date Value  07/11/2017 3.51 (H)  07/11/2017 3.69 (H)  07/10/2017 3.68 (H)  07/10/2017 3.68 (H)  07/09/2017 3.70 (H)  07/09/2017 3.75 (H)  06/21/2017 2.49 (H)   06/20/2017 2.57 (H)  06/19/2017 2.66 (H)  06/18/2017 2.65 (H)     Past Medical History:  Diagnosis Date  . Adenocarcinoma of rectum ([place])    a. 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 AICD (automatic cardioverter/defibrillator) present 2002   BI V ICD  . Anemia   . CAD (coronary artery disease)    a. BMS to LAD 2001 at [facility] b. PTCA/atherectomy ramus and BMS to LAD 2009  . CHF (congestive heart failure) ([place])   . Cholelithiasis 06/2015  . Chronic kidney disease, stage IV (severe) ([place])   . Chronic systolic heart failure ([place])   . Colostomy in place [facility])   . Dizziness    a. chronic. Admission for this 07/18/2014  . DM type 2, uncontrolled, with renal complications ([place])   . Dysrhythmia   . Essential hypertension, benign   . GERD (gastroesophageal reflux disease)   . HCAP (healthcare-associated pneumonia) 07/21/2015  . Hematuria   . History of blood transfusion    "I've had 2 units so far this year" (09/27/2015)  . HLD (hyperlipidemia)   . Ischemic cardiomyopathy    EF 18% by nuclear study 2016, multiple myocardial infarctions in past    . Myocardial infarction ([place]) 2001  . Obesity   . Orthostatic hypotension   . OSA on CPAP   . Paroxysmal atrial fibrillation (H[facility])    a. on amiodarone, digoxin and Eliquis  . PONV (postoperative nausea and vomiting)   . Prostate cancer ([place])    a. s/p seed implants with chemo and radiation  . SAH (  subarachnoid hemorrhage) ([place])    post-traumatic (fall) [facility] 12/2014  . TIA (transient ischemic attack)     Past Surgical History:  Procedure Laterality Date  . Abdominal and Perineal Resection of Rectum with Total Mesorectal Excision  10/04/2007  . AV FISTULA PLACEMENT Right 09/16/2015   Procedure: ARTERIOVENOUS (AV) FISTULA CREATION - BRACHIOCEPHALIC;  Surgeon: Elam Dutch, MD;  Location: [place];  Service: Vascular;  Laterality: Right;  . BI-VENTRICULAR IMPLANTABLE CARDIOVERTER DEFIBRILLATOR  (CRT-D)  2009  . CARDIAC CATHETERIZATION  08/2001; 2009   ; [name]  07/10/2013  . CARDIAC DEFIBRILLATOR PLACEMENT  2002  . CARDIAC DEFIBRILLATOR PLACEMENT  2009   Upgraded to a BiV ICD  . COLONOSCOPY  09/14/2011   Dr. Gala Romney: via colostomy, Single pedunculated benign inflammatory polyp. Due for surveillance Oct 2015  . COLONOSCOPY N/A 07/02/2014   Procedure: COLONOSCOPY;  Surgeon: Daneil Dolin, MD;  [facility];  Service: Endoscopy;  Laterality: N/A;  7:30 / COLONOSCOPY THRU COLOSTOMY  . COLONOSCOPY N/A 12/11/2014   Dr. Gala Romney via colostomy. Normal. Repeat in 2021.   Marland Kitchen COLONOSCOPY N/A 08/24/2015   Dr. Havery Moros: diminutive polyp not removed, stoma site of bleeding  . COLOSTOMY  2008  . CORONARY ANGIOPLASTY WITH STENT PLACEMENT  2001; ~ 2006   "1 + 1"   . ELECTROPHYSIOLOGIC STUDY N/A 08/28/2015   Procedure: AV Node Ablation;  Surgeon: Will Meredith Leeds, MD;  Location: [place] CV LAB;  Service: Cardiovascular;  Laterality: N/A;  . EP IMPLANTABLE DEVICE N/A 04/10/2015   Procedure: Ppm/Biv Ppm Generator Changeout;  Surgeon: Evans Lance, MD;  Location: [place] INVASIVE CV LAB CUPID;  Service: Cardiovascular;  Laterality: N/A;  . ERCP N/A 06/11/2016   Procedure: ENDOSCOPIC RETROGRADE CHOLANGIOPANCREATOGRAPHY (ERCP);  Surgeon: Teena Irani, MD;  Location: [facility] ENDOSCOPY;  Service: Endoscopy;  Laterality: N/A;  . ERCP N/A 01/26/2017   Procedure: ENDOSCOPIC RETROGRADE CHOLANGIOPANCREATOGRAPHY (ERCP);  Surgeon: Teena Irani, MD;  Location: [facility] ENDOSCOPY;  Service: Endoscopy;  Laterality: N/A;  . ERCP  06/2016   Dr. Amedeo Plenty: duodenal fistula, dilated bile duct but no obvious stones, s/p sphincterotomy and stent placement  . ERCP  01/2017   Dr. Amedeo Plenty: CBD stones, s/p sphincterotomy, balloon extraction, stent removal  . ESOPHAGOGASTRODUODENOSCOPY N/A 07/02/2014   Procedure: ESOPHAGOGASTRODUODENOSCOPY (EGD);  Surgeon: Daneil Dolin, MD;  [facility];  Service: Endoscopy;  Laterality: N/A;  7:30  . ESOPHAGOGASTRODUODENOSCOPY N/A 12/11/2014   DDU:KGURKY EGD  .  ESOPHAGOGASTRODUODENOSCOPY (EGD) WITH PROPOFOL N/A 04/18/2017   Procedure: ESOPHAGOGASTRODUODENOSCOPY (EGD) WITH PROPOFOL;  Surgeon: Daneil Dolin, MD;  [facility];  Service: Endoscopy;  Laterality: N/A;  2:30pm  . GASTROINTESTINAL STENT REMOVAL N/A 01/26/2017   Procedure: GASTROINTESTINAL STENT REMOVAL;  Surgeon: Teena Irani, MD;  Location: [place];  Service: Endoscopy;  Laterality: N/A;  . GIVENS CAPSULE STUDY N/A 07/23/2015   Dr. Michail Sermon: small non-bleeding AVM, mild gastritis  . LEFT HEART CATHETERIZATION WITH CORONARY ANGIOGRAM N/A 07/13/2013   Procedure: LEFT HEART CATHETERIZATION WITH CORONARY ANGIOGRAM;  Surgeon: Lorretta Harp, MD;  Location: [facility] CATH LAB;  Service: Cardiovascular;  Laterality: N/A;  . Venia Minks DILATION N/A 07/02/2014   Procedure: Venia Minks DILATION;  Surgeon: Daneil Dolin, MD;  [facility];  Service: Endoscopy;  Laterality: N/A;  7:30  . MALONEY DILATION N/A 04/18/2017   Procedure: Venia Minks DILATION;  Surgeon: Daneil Dolin, MD;  [facility];  Service: Endoscopy;  Laterality: N/A;  . PORTACATH PLACEMENT  06/2007   "  removed ~ 1 yr later"  . RIGHT HEART CATHETERIZATION N/A 02/24/2015   Procedure: RIGHT HEART CATH;  Surgeon: Jolaine Artist, MD;  Location: [facility] CATH LAB;  Service: Cardiovascular;  Laterality: N/A;  . SAVORY DILATION N/A 07/02/2014   Procedure: SAVORY DILATION;  Surgeon: Daneil Dolin, MD;  [facility];  Service: Endoscopy;  Laterality: N/A;  7:30    Family History  Problem Relation Age of Onset  . Colon cancer Mother 52  . Coronary artery disease Father   . Colon cancer Sister 58  . Diabetes Sister   . Colon cancer Other        2 cousins, succumbed to illness   SH  reports that he has never smoked. He has never used smokeless tobacco. He reports that he does not drink alcohol or use drugs.   ROS Balance of 12 systems is negative w/ exceptions as above  Physical Exam  Blood pressure (!) 94/56,  pulse 73, temperature 97.6 F (36.4 C), temperature source Oral, resp. rate 17, height 5\' 10"  (1.778 m), weight 199 lb 11.8 oz (90.6 kg), SpO2 100 %. GEN: Chronically-ill appearing caucasian male resting in bed. Wife present and tearful at bedside. Fatigued, appears weak. Generalized anasarca with pitting edema abd wall/to armpits HENT: Axis, AT. EOMI.  CV: RRR. Right IJ temp HD cath (placed 8/5).  PULM: BL bibasilar crackles. Speaks in short sentences. On 2L via [place].  ABD: Protuberant. Pitting edema to axilla BL. +bs Colostomy EXT: RUE fistula with good thrill and bruit. No surrounding erythema or warmth. Woody changes BL LE c/w chronic edema.   Home medications Prior to Admission medications   Medication Sig Start Date End Date Taking? Authorizing Provider  atorvastatin (LIPITOR) 40 MG tablet Take 1 tablet (40 mg total) by mouth daily. Patient taking differently: Take 40 mg by mouth at bedtime.  02/15/17 07/10/17 Yes Bensimhon, Shaune Pascal, MD  Coenzyme Q10 (COQ10) 50 MG CAPS Take 1 capsule by mouth daily.   Yes [provider]  collagenase (SANTYL) ointment Apply topically daily. Patient taking differently: Apply topically every other day. At night 04/28/17  Yes Lavina Hamman, MD  ferrous sulfate 325 (65 FE) MG tablet Take 1 tablet (325 mg total) by mouth 3 (three) times daily with meals. 04/28/17  Yes Berle Mull M, MD  glucose blood test strip Use to test blood sugar 3 times daily 03/23/17  Yes Elayne Snare, MD  insulin aspart (NOVOLOG FLEXPEN) 100 UNIT/ML FlexPen 5 units before each meal Patient taking differently: Inject 6 Units into the skin 3 (three) times daily with meals. 5 units before each meal 03/23/17  Yes Elayne Snare, MD  metolazone (ZAROXOLYN) 2.5 MG tablet take 1 tablet by mouth weekly if needed for SWELLING 06/27/17  Yes Branch, Alphonse Guild, MD  metoprolol succinate (TOPROL-XL) 25 MG 24 hr tablet Take 0.5 tablets (12.5 mg total) by mouth 2 (two) times daily. 06/22/17  Yes Clegg,  Amy D, NP  nitroGLYCERIN (NITROSTAT) 0.4 MG SL tablet Place 1 tablet (0.4 mg total) under the tongue every 5 (five) minutes as needed for chest pain. 06/10/17 09/08/17 Yes BranchAlphonse Guild, MD  pantoprazole (PROTONIX) 40 MG tablet Take 1 tablet (40 mg total) by mouth daily. 05/07/16  Yes Shirley Friar, PA-C  polyethylene glycol [facility] / GLYCOLAX) packet Take 17 g by mouth daily as needed for mild constipation. 04/28/17  Yes Lavina Hamman, MD  potassium chloride SA (K-DUR,KLOR-CON) 20 MEQ tablet Take 2 tablets (  40 mEq total) by mouth daily. Patient taking differently: Take 20-40 mEq by mouth daily. Only takes 2 tablets when taking metolazone 06/21/17  Yes Clegg, Amy D, NP  PROAIR RESPICLICK 749 (90 BASE) MCG/ACT AEPB Inhale 1 puff into the lungs every 6 (six) hours as needed (for breathing).  08/15/15  Yes [provider]  torsemide (DEMADEX) 20 MG tablet TAKE 4 TABLETS IN THE MORNING AND 2 TABLET IN THE EVENING 06/21/17  Yes Clegg, Amy D, NP  warfarin (COUMADIN) 2 MG tablet Take 1 tablet daily except 1/2 tablet on Tuesdays and Fridays Patient taking differently: Take 1 tablet daily except 1/2 tablet on Tuesdays and Thursdays 05/26/17  Yes Bensimhon, Shaune Pascal, MD  Insulin Glargine (LANTUS SOLOSTAR) 100 UNIT/ML Solostar Pen Inject 15 Units into the skin daily at 10 pm. Patient not taking: Reported on 06/13/2017 04/13/17   Elayne Snare, MD   Current Medications Scheduled Meds: . aspirin  325 mg Oral Daily  . atorvastatin  40 mg Oral q1800  . Chlorhexidine Gluconate Cloth  6 each Topical Daily  . colchicine  0.3 mg Oral Daily  . insulin aspart  0-9 Units Subcutaneous TID WC  . sodium chloride flush  10-40 mL Intracatheter Q12H  . Warfarin - Pharmacist Dosing Inpatient   Does not apply q1800   Continuous Infusions: . sodium chloride 250 mL (07/11/17 1600)  .  ceFAZolin (ANCEF) IV Stopped (07/11/17 0603)  . furosemide (LASIX) infusion 20 mg/hr (07/11/17 1600)  . norepinephrine  (LEVOPHED) Adult infusion 8.96 mcg/min (07/11/17 1600)  . rifampin (RIFADIN) IVPB Stopped (07/11/17 1011)   PRN Meds:.sodium chloride, acetaminophen, alum & mag hydroxide-simeth, fentaNYL (SUBLIMAZE) injection, heparin, magic mouthwash w/lidocaine, ondansetron (ZOFRAN) IV, sodium chloride flush   Recent Labs Lab 07/09/17 2132 07/09/17 2212 07/10/17 1115 07/11/17 0335  WBC 18.1*  --  28.0* 31.2*  NEUTROABS 16.8*  --  26.0*  --   HGB 8.2* 8.8* 8.3* 8.1*  HCT 24.9* 26.0* 25.2* 24.4*  MCV 92.6  --  92.6 91.0  PLT 158  --  175 193    Recent Labs Lab 07/09/17 2132 07/09/17 2212 07/10/17 0200 07/10/17 1115 07/10/17 1947 07/11/17 0335 07/11/17 1324  NA 124* 124*  --  126* 124* 124* 121*  K 3.7 3.7  --  3.8 3.5 3.7 3.6  CL 88* 89*  --  91* 89* 90* 90*  CO2 20*  --   --  16* 20* 19* 17*  GLUCOSE 282* 280*  --  252* 268* 263* 309*  BUN 106* 97*  --  104* 106* 109* 109*  CREATININE 3.75* 3.70*  --  3.68* 3.68* 3.69* 3.51*  CALCIUM 8.7*  --   --  8.3* 8.4* 8.6* 8.4*  PHOS  --   --  4.6  --   --   --   --      Assessment & Plan: This is a chronically-ill 61 y/o M with extensive medical history here with acute respiratory failure, acute on chronic renal failure, volume overload, shock and MSSA bacteremia. He was recently admitted and diuresed with IV lasix, metolazone and milrinone. He is now 30 lbs up from his dry weight just 2 weeks after discharge. He has been persistently hypotensive requiring levophed and is not responding as desired to IV diuresis. Renal consulted for CRRT.   Renal: AKI on CKD 4 in setting of septic and cardiogenic shock with poor UOP despite high-dose lasix. Frank volume overload. Cardiorenal syndrome. Clearly volume overloaded on examination as manifested  by pitting edema to the axilla as well as CVP of 25. He is persistently hypotensive requiring levophed and has not responded as desired to high-dose lasix. HD cath was placed on admission in anticipation of HD.  He does have R arm AVF in place however given patients hypotension, he will require initiation of CRRT tonight via right temp HD cath. He may be ESRD now.  -Initiate CRRT tonight -Dry weight ~175 lbs, current weight 199.  -Daily weights, strict I&O -Daily renal function panels -Eval PTH status  Infectious: MSSA bacteremia, AICD in place ID on board, currently on Cefazolin and Rifampin. Will need endocarditis work-up and line holiday however at this point he would benefit from CRRT and volume removal prior to line holiday. Discussed with ID.  -Abx per ID -Will need TEE -Repeat Bcx pending -Hip CT pending  Cardiac: Shock, Hypotension, Chronic atrial fibrillation On Coumadin at home. Transition to heparin as starting CRRT. Remains on levophed for BP support.  -DC lasix -CRRT   Hematologic: MSSA bacteremia, Anemia Hb 8.3 and MCV normal on admission without signs of acute blood loss. Could be secondary to advanced renal disease &  acute illness. -Iron studies   Electrolytes: Hyponatremia Sodium 121 today. Has hx of cirrhosis and is currently volume overloaded. K 3.6 and phos 4.6 on admission. Corrected calcium normal.  -Renal function daily, replace lytes as indicated  Einar Gip, DO Internal Medicine - PGY2 07/11/2017, 4:24 PM   I have seen and examined this patient and agree with plan and assessment in the above note with renal recommendations/intervention highlighted. 61 yo baseline CKD4 and prior period of HD dependence 2017 2/2 ischemic ATN (06/2016-10/2016 via R AVF), advanced HF, EF 15-20%, recent 06/2017 admission for volume overload, requiring IV lasix/milrinone. With 1 week of discharge onset SOB, weight gain (30 lb since discharge), anasarca. Creatinine 2.5 at discharge, mid 3's on admission with mixed septic/cardiogenic shock (MSSA bacteremia), refractory to lasix drip. We are asked to see for AKI/pulmonary edema. Has R IJ line placed on admission (prior to recognition of +BC's  and agree with ID that it should come out, but will need to use in the short run for CRRT until volume status under better control and/or can tolerate conventional HD via his AVF) (will have to be off pressors for that).   Haddy Mullinax B,MD 07/11/2017 7:06 PM

## 2017-07-11 NOTE — Progress Notes (Signed)
ANTICOAGULATION CONSULT NOTE - Initial Consult  Pharmacy Consult for warfarin Indication: atrial fibrillation  No Known Allergies  Patient Measurements: Height: 5\' 10"  (177.8 cm) Weight: 199 lb 11.8 oz (90.6 kg) IBW/kg (Calculated) : 73  Vital Signs: Temp: 97.6 F (36.4 C) (08/06 1225) Temp Source: Oral (08/06 1225) BP: 114/51 (08/06 1300) Pulse Rate: 68 (08/06 1300)  Labs:  Recent Labs  07/09/17 2132 07/09/17 2212 07/10/17 0200 07/10/17 1115 07/10/17 1800 07/10/17 1947 07/11/17 0335 07/11/17 1324  HGB 8.2* 8.8*  --  8.3*  --   --  8.1*  --   HCT 24.9* 26.0*  --  25.2*  --   --  24.4*  --   PLT 158  --   --  175  --   --  193  --   LABPROT 22.0*  --   --  24.1*  --   --  24.9*  --   INR 1.89  --   --  2.12  --   --  2.21  --   CREATININE 3.75* 3.70*  --  3.68*  --  3.68* 3.69* 3.51*  TROPONINI  --   --  0.11* 1.05* 41.84*  --   --   --     Estimated Creatinine Clearance: 24.7 mL/min (A) (by C-G formula based on SCr of 3.51 mg/dL (H)).   Medical History: Past Medical History:  Diagnosis Date  . Adenocarcinoma of rectum ([place])    a. 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 AICD (automatic cardioverter/defibrillator) present 2002   BI V ICD  . Anemia   . CAD (coronary artery disease)    a. BMS to LAD 2001 at [facility] b. PTCA/atherectomy ramus and BMS to LAD 2009  . CHF (congestive heart failure) ([place])   . Cholelithiasis 06/2015  . Chronic kidney disease, stage IV (severe) ([place])   . Chronic systolic heart failure ([place])   . Colostomy in place [facility])   . Dizziness    a. chronic. Admission for this 07/18/2014  . DM type 2, uncontrolled, with renal complications ([place])   . Dysrhythmia   . Essential hypertension, benign   . GERD (gastroesophageal reflux disease)   . HCAP (healthcare-associated pneumonia) 07/21/2015  . Hematuria   . History of blood transfusion    "I've had 2 units so far this year" (09/27/2015)  . HLD (hyperlipidemia)   . Ischemic cardiomyopathy    EF 18% by nuclear study  2016, multiple myocardial infarctions in past    . Myocardial infarction ([place]) 2001  . Obesity   . Orthostatic hypotension   . OSA on CPAP   . Paroxysmal atrial fibrillation ([facility])    a. on amiodarone, digoxin and Eliquis  . PONV (postoperative nausea and vomiting)   . Prostate cancer ([place])    a. s/p seed implants with chemo and radiation  . SAH (subarachnoid hemorrhage) ([place])    post-traumatic (fall) [facility] 12/2014  . TIA (transient ischemic attack)    Assessment: 61 year old male with chronic afib well known to pharmacy service for warfarin dosing. Patient recently seen in clinic with supratherapeutic INR 3.7 dose reduced to 1mg  of coumadin daily except 2mg  on Tuesday, Thursday, Saturday. INR on admit is low at 1.89, pt reports compliance.   Started on rifampin until vegetation is ruled out - will impact warfarin sensitivity. Dose was increased to 3 mg last night d/t medication interaction.   INR is 2.21 today. H/H and platelets remain stable. No bleeding issues noted.  Goal of Therapy:  INR 2-3 Monitor platelets by anticoagulation protocol: Yes   Plan:  Warfarin 2 mg tonight Daily INR  Doylene Canard, PharmD Clinical Pharmacist 07/11/2017 2:23 PM

## 2017-07-11 NOTE — Progress Notes (Signed)
Spoke to PT about wearing CPAP. PT says he does not wear CPAP at home. Vitals within normal limits.Pt was advised if any changes there is a CPAP and Bipap order in place. RN present for conversation.

## 2017-07-11 NOTE — Consult Note (Signed)
[facility] for Infectious Disease    Date of Admission:  07/09/2017   Total days of antibiotics 2        Day 2 cefazolin        Day 2 rifampin              Reason for Consult: Bacteremia   Referring Provider: Dr. Seward [name] Primary Care Provider: Dr. Seward [name]  Assessment and plan:  Bacteremia The patient did not meet criteria for sepsis-no fever on admission, pulse=75, resp=14, bp=89/56 There is skin breakdown present on both left and right lower leg, but more present in left leg. The skin breakdown is congruent with cellulitis. Per blood culture drawn 07/09/17 there is presence of staph aureus bacteremia 2/2. The cellulitis is the likely source for the bacteremia. -TTE showed no presence of vegetation. Recommend TEE  -Consult electrophysiologist to evaluate BiV-ICD -Follow blood cultures and get sensitivities. Get no growth at 5 days prior to placing line again.  -Continue cefazolin and rifampin  -Treat for 6 weeks as concern for endocarditis and deep infection  -Remove HD cath if pt does not need dialysis as there is concern for the bacteremia seeding to the cath.  Right Hip Pain  Patient has been having right hip pain for the past week. He has been having weakness and difficulty moving his right leg.  -CT wo contast ordered to evaluate for possible osteomyelitis vs pyomyositis -Continue treating for gout with colchicine        [name] [facility] Management Team Staphylococcus aureus bacteremia   Staphylococcus aureus bacteremia (SAB) is associated with a high rate of complications and mortality.  Specific aspects of clinical management are critical to optimizing the outcome of patients with SAB.  Therefore, the [facility] Antimicrobial Management Team [facility]) has initiated an intervention aimed at improving the management of SAB at [facility].  To do so, Infectious Diseases physicians are providing an evidence-based consult for the management  of all patients with SAB.     Yes No Comments  Perform follow-up blood cultures (even if the patient is afebrile) to ensure clearance of bacteremia [x]  []    Remove vascular catheter and obtain follow-up blood cultures after the removal of the catheter [x]  []  Remove cath if not necessary for HD as it is probably that staph would infiltrate it  Perform echocardiography to evaluate for endocarditis (transthoracic ECHO is 40-50% sensitive, TEE is > 90% sensitive) [x]  []  Please keep in mind, that neither test can definitively EXCLUDE endocarditis, and that should clinical suspicion remain high for endocarditis the patient should then still be treated with an "endocarditis" duration of therapy = 6 weeks  Consult electrophysiologist to evaluate implanted cardiac device (pacemaker, ICD) [x]  []    Ensure source control []  []  Have all abscesses been drained effectively? Have deep seeded infections (septic joints or osteomyelitis) had appropriate surgical debridement?  Investigate for "metastatic" sites of infection []  []  Does the patient have ANY symptom or physical exam finding that would suggest a deeper infection (back or neck pain that may be suggestive of vertebral osteomyelitis or epidural abscess, muscle pain that could be a symptom of pyomyositis)?  Keep in mind that for deep seeded infections MRI imaging with contrast is preferred rather than other often insensitive tests such as plain x-rays, especially early in a patient's presentation.  Change antibiotic therapy to __________________ []  []  Beta-lactam antibiotics are preferred for MSSA due to higher cure rates.  If on Vancomycin, goal trough should be 15 - 20 mcg/mL  Estimated duration of IV antibiotic therapy:   []  []  Consult case management for probably prolonged outpatient IV antibiotic therapy     . atorvastatin  40 mg Oral q1800  . Chlorhexidine Gluconate Cloth  6 each Topical Daily  . colchicine  0.3 mg Oral Daily  . insulin aspart  0-9  Units Subcutaneous TID WC  . sodium chloride flush  10-40 mL Intracatheter Q12H  . Warfarin - Pharmacist Dosing Inpatient   Does not apply q1800    HPI: [name] is a 61 y.o. 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 The pain in his groin started on the last weekend in July when he was at the beach. He felt like he pulled a muscle and he had difficulty walking. The patient was seen at an urgent care and subsequently at [facility] where he was ruled out for a blood clot. The pain is present in his right groin and radiates to his right buttock, right thigh, and right leg. The patient was prescribed flexeril and bactrim for possible cellulitis. The patient however did not take the bactrim as he was told by pharmacist that it interferes with warfarin.   During his hospitalization blood culture resulted positive for staph aureus bacteremia. The patient has been placed on rifampin and cefazolin.    Review of Systems: Review of Systems  Constitutional: Positive for malaise/fatigue. Negative for fever.  Respiratory: Positive for shortness of breath.   Cardiovascular: Positive for leg swelling. Negative for chest pain and palpitations.  Skin: Positive for rash.  Neurological: Positive for weakness.    Past Medical History:  Diagnosis Date  . Adenocarcinoma of rectum ([place])    a. 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 AICD (automatic cardioverter/defibrillator) present 2002   BI V ICD  . Anemia   . CAD (coronary artery disease)    a. BMS to LAD 2001 at [facility] b. PTCA/atherectomy ramus and BMS to LAD 2009  . CHF (congestive heart failure) ([place])   . Cholelithiasis 06/2015  . Chronic kidney disease, stage IV (severe) ([place])   . Chronic systolic heart failure ([place])   .  Colostomy in place [facility])   . Dizziness    a. chronic. Admission for this 07/18/2014  . DM type 2, uncontrolled, with renal complications ([place])   . Dysrhythmia   . Essential hypertension, benign   . GERD (gastroesophageal reflux disease)   . HCAP (healthcare-associated pneumonia) 07/21/2015  . Hematuria   . History of blood transfusion    "I've had 2 units so far this year" (09/27/2015)  . HLD (hyperlipidemia)   . Ischemic cardiomyopathy    EF 18% by nuclear study 2016, multiple myocardial infarctions in past    . Myocardial infarction ([place]) 2001  . Obesity   . Orthostatic hypotension   . OSA on CPAP   . Paroxysmal atrial fibrillation ([facility])    a. on amiodarone, digoxin and Eliquis  . PONV (postoperative nausea and vomiting)   . Prostate cancer ([place])    a. s/p seed implants with chemo and radiation  . SAH (subarachnoid hemorrhage) ([place])    post-traumatic (fall[facility] 12/2014  . TIA (transient ischemic attack)     Social History  Substance  Use Topics  . Smoking status: Never Smoker  . Smokeless tobacco: Never Used  . Alcohol use No     Comment: Former user 45 years ago    Family History  Problem Relation Age of Onset  . Colon cancer Mother 37  . Coronary artery disease Father   . Colon cancer Sister 62  . Diabetes Sister   . Colon cancer Other        2 cousins, succumbed to illness   No Known Allergies  OBJECTIVE: Blood pressure (!) 104/43, pulse 73, temperature 97.7 F (36.5 C), temperature source Oral, resp. rate 18, height 5\' 10"  (1.778 m), weight 199 lb 11.8 oz (90.6 kg), SpO2 99 %.  Physical Exam  Constitutional: He appears distressed.  HENT:  Head: Normocephalic and atraumatic.  Cardiovascular: Normal rate, regular rhythm and normal heart sounds.   Pulmonary/Chest: Effort normal and breath sounds normal. No respiratory distress. He has no wheezes.  Abdominal: Soft. Bowel sounds are normal. He exhibits distension. There is no tenderness.  Skin: Rash noted.    Left leg was erythematous to mid calf and had two ulcers.  Right leg had an ulcer on calf Possible janeway lesions present on bilateral fingers  Psychiatric: Mood, memory, affect and judgment normal.    Lab Results Lab Results  Component Value Date   WBC 31.2 (H) 07/11/2017   HGB 8.1 (L) 07/11/2017   HCT 24.4 (L) 07/11/2017   MCV 91.0 07/11/2017   PLT 193 07/11/2017    Lab Results  Component Value Date   CREATININE 3.69 (H) 07/11/2017   BUN 109 (H) 07/11/2017   NA 124 (L) 07/11/2017   K 3.7 07/11/2017   CL 90 (L) 07/11/2017   CO2 19 (L) 07/11/2017    Lab Results  Component Value Date   ALT 16 (L) 07/10/2017   AST 47 (H) 07/10/2017   ALKPHOS 96 07/10/2017   BILITOT 2.8 (H) 07/10/2017     Microbiology: Recent Results (from the past 240 hour(s))  Blood culture (routine x 2)     Status: None (Preliminary result)   Collection Time: 07/09/17 10:04 PM  Result Value Ref Range Status   Specimen Description BLOOD LEFT HAND  Final   Special Requests IN PEDIATRIC BOTTLE Blood Culture adequate volume  Final   Culture  Setup Time   Final    GRAM POSITIVE COCCI IN CLUSTERS IN PEDIATRIC BOTTLE CRITICAL RESULT CALLED TO, READ BACK BY AND VERIFIED WITH: R RUMBARGER,PHARMD AT 1613 07/10/17 BY L BENFIELD    Culture GRAM POSITIVE COCCI  Final   Report Status PENDING  Incomplete  Blood Culture ID Panel (Reflexed)     Status: Abnormal   Collection Time: 07/09/17 10:04 PM  Result Value Ref Range Status   Enterococcus species NOT DETECTED NOT DETECTED Final   Listeria monocytogenes NOT DETECTED NOT DETECTED Final   Staphylococcus species DETECTED (A) NOT DETECTED Final    Comment: CRITICAL RESULT CALLED TO, READ BACK BY AND VERIFIED WITH: R RUMBARGER,PHARMD AT 1613 07/10/17 BY L BENFIELD    Staphylococcus aureus DETECTED (A) NOT DETECTED Final    Comment: Methicillin (oxacillin) susceptible Staphylococcus aureus (MSSA). Preferred therapy is anti staphylococcal beta lactam antibiotic  (Cefazolin or Nafcillin), unless clinically contraindicated. CRITICAL RESULT CALLED TO, READ BACK BY AND VERIFIED WITH: R RUMBARGER,PHARMD AT 1613 07/10/17 BY L BENFIELD    Methicillin resistance NOT DETECTED NOT DETECTED Final   Streptococcus species NOT DETECTED NOT DETECTED Final   Streptococcus agalactiae NOT DETECTED NOT DETECTED  Final   Streptococcus pneumoniae NOT DETECTED NOT DETECTED Final   Streptococcus pyogenes NOT DETECTED NOT DETECTED Final   Acinetobacter baumannii NOT DETECTED NOT DETECTED Final   Enterobacteriaceae species NOT DETECTED NOT DETECTED Final   Enterobacter cloacae complex NOT DETECTED NOT DETECTED Final   Escherichia coli NOT DETECTED NOT DETECTED Final   Klebsiella oxytoca NOT DETECTED NOT DETECTED Final   Klebsiella pneumoniae NOT DETECTED NOT DETECTED Final   Proteus species NOT DETECTED NOT DETECTED Final   Serratia marcescens NOT DETECTED NOT DETECTED Final   Haemophilus influenzae NOT DETECTED NOT DETECTED Final   Neisseria meningitidis NOT DETECTED NOT DETECTED Final   Pseudomonas aeruginosa NOT DETECTED NOT DETECTED Final   Candida albicans NOT DETECTED NOT DETECTED Final   Candida glabrata NOT DETECTED NOT DETECTED Final   Candida krusei NOT DETECTED NOT DETECTED Final   Candida parapsilosis NOT DETECTED NOT DETECTED Final   Candida tropicalis NOT DETECTED NOT DETECTED Final  Blood culture (routine x 2)     Status: None (Preliminary result)   Collection Time: 07/09/17 10:08 PM  Result Value Ref Range Status   Specimen Description BLOOD LEFT FOREARM  Final   Special Requests   Final    BOTTLES DRAWN AEROBIC AND ANAEROBIC Blood Culture adequate volume   Culture  Setup Time   Final    GRAM POSITIVE COCCI IN CLUSTERS IN BOTH AEROBIC AND ANAEROBIC BOTTLES CRITICAL RESULT CALLED TO, READ BACK BY AND VERIFIED WITH: R RUMBARGER,PHARMD AT 1613 07/10/17 BY L BENFIELD    Culture GRAM POSITIVE COCCI  Final   Report Status PENDING  Incomplete  MRSA PCR  Screening     Status: None   Collection Time: 07/10/17  2:48 AM  Result Value Ref Range Status   MRSA by PCR NEGATIVE NEGATIVE Final    Comment:        The GeneXpert MRSA Assay (FDA approved for NASAL specimens only), is one component of a comprehensive MRSA colonization surveillance program. It is not intended to diagnose MRSA infection nor to guide or monitor treatment for MRSA infections.     Lars Mage, [place] for [place] [phone] pager   [phone] cell 07/11/2017, 9:05 AM

## 2017-07-11 NOTE — Progress Notes (Signed)
ANTICOAGULATION CONSULT NOTE   Pharmacy Consult for heparin Indication: atrial fibrillation  No Known Allergies  Patient Measurements: Height: 5\' 10"  (177.8 cm) Weight: 199 lb 11.8 oz (90.6 kg) IBW/kg (Calculated) : 73  Heparin dosing weight: 90.6 kg  Vital Signs: Temp: 97.6 F (36.4 C) (08/06 1601) Temp Source: Oral (08/06 1601) BP: 93/53 (08/06 1700) Pulse Rate: 73 (08/06 1700)  Labs:  Recent Labs  07/09/17 2132 07/09/17 2212 07/10/17 0200 07/10/17 1115 07/10/17 1800 07/10/17 1947 07/11/17 0335 07/11/17 1324  HGB 8.2* 8.8*  --  8.3*  --   --  8.1*  --   HCT 24.9* 26.0*  --  25.2*  --   --  24.4*  --   PLT 158  --   --  175  --   --  193  --   LABPROT 22.0*  --   --  24.1*  --   --  24.9*  --   INR 1.89  --   --  2.12  --   --  2.21  --   CREATININE 3.75* 3.70*  --  3.68*  --  3.68* 3.69* 3.51*  TROPONINI  --   --  0.11* 1.05* 41.84*  --   --   --     Estimated Creatinine Clearance: 24.7 mL/min (A) (by C-G formula based on SCr of 3.51 mg/dL (H)).  Assessment: 61 year old male with chronic afib on warfarin prior to admission. Patient recently seen in clinic with supratherapeutic INR 3.7 dose reduced to 1mg  of coumadin daily except 2mg  on Tuesday, Thursday, Saturday.  Pt is now requiring CRRT. Planning to hold warfarin and begin heparin. Current INR is 2.2.    Goal of Therapy:  INR 2-3 Monitor platelets by anticoagulation protocol: Yes    Plan:  -Begin heparin when INR < 2 -Can likely begin at 1300 units/hr, order not placed yet -Follow daily INR for now    Hughes Better, PharmD, BCPS Clinical Pharmacist 07/11/2017 6:55 PM

## 2017-07-11 NOTE — Consult Note (Signed)
[place] Nurse ostomy consult note Stoma type/location: LlQ, end colostomy since 2008 Stomal assessment/size: pouch intact, patient using closed end pouches Peristomal assessment: not assessed today Ostomy pouching: /2pc. 2 3/4" skin barrier with 2 3/4" closed end pouch  Patient reports he and his wife are independent in the care of his stoma.  He uses closed end pouches. Changes skin barrier every 5 days. He last change the wafer last evening 07/10/17.  Has supplies in his room from home, would like to continue to use these.   Re consult if needed, will not follow at this time. Thanks  Lenzy Kerschner [employer], RN,CWOCN, CNS, [place] [phone])

## 2017-07-11 NOTE — Progress Notes (Signed)
Inpatient Diabetes Program Recommendations  AACE/ADA: New Consensus Statement on Inpatient Glycemic Control (2015)  Target Ranges:  Prepandial:   less than 140 mg/dL      Peak postprandial:   less than 180 mg/dL (1-2 hours)      Critically ill patients:  140 - 180 mg/dL   Lab Results  Component Value Date   GLUCAP 292 (H) 07/11/2017   HGBA1C 11.2 (H) 06/09/2016    Review of Glycemic Control:  Results for MOREY, ANDONIAN (MRN [id number]) as of 07/11/2017 10:57  Ref. Range 07/10/2017 08:43 07/10/2017 11:50 07/10/2017 16:19 07/10/2017 21:53 07/11/2017 08:18  Glucose-Capillary Latest Ref Range: 65 - 99 mg/dL 246 (H) 241 (H) 244 (H) 264 (H) 292 (H)   Diabetes history: Type 2 diabetes Outpatient Diabetes medications: Lantus 15 units daily, Novolog 5 units tid with meals Current orders for Inpatient glycemic control:  Novolog sensitive tid with meals and HS  Inpatient Diabetes Program Recommendations:   Please add Lantus 15 units daily.  Also consider adding Novolog 3 units tid with meals (hold if patient eats less than 50%).   Thanks, Adah Perl, RN, BC-ADM Inpatient Diabetes Coordinator Pager [phone] (8a-5p)

## 2017-07-11 NOTE — Progress Notes (Signed)
CRITICAL VALUE ALERT  Critical Value:  Lactic Acid 3.2  Date & Time Notied:  07/11/17 0455  Provider Notified: Dr. Halford Chessman   Orders Received/Actions taken: No orders received at this time. Richarda Blade RN

## 2017-07-12 ENCOUNTER — Encounter (HOSPITAL_COMMUNITY): Payer: Self-pay | Admitting: Internal Medicine

## 2017-07-12 LAB — CBC WITH DIFFERENTIAL/PLATELET
Band Neutrophils: 3 %
Basophils Absolute: 0 10*3/uL (ref 0.0–0.1)
Basophils Relative: 0 %
Blasts: 0 %
EOS PCT: 1 %
Eosinophils Absolute: 0.3 10*3/uL (ref 0.0–0.7)
HCT: 24.2 % — ABNORMAL LOW (ref 39.0–52.0)
Hemoglobin: 7.9 g/dL — ABNORMAL LOW (ref 13.0–17.0)
LYMPHS ABS: 0.3 10*3/uL — AB (ref 0.7–4.0)
LYMPHS PCT: 1 %
MCH: 29.2 pg (ref 26.0–34.0)
MCHC: 32.6 g/dL (ref 30.0–36.0)
MCV: 89.3 fL (ref 78.0–100.0)
MONO ABS: 1.2 10*3/uL — AB (ref 0.1–1.0)
MONOS PCT: 4 %
MYELOCYTES: 0 %
Metamyelocytes Relative: 1 %
NEUTROS PCT: 90 %
NRBC: 0 /100{WBCs}
Neutro Abs: 28.1 10*3/uL — ABNORMAL HIGH (ref 1.7–7.7)
PLATELETS: 172 10*3/uL (ref 150–400)
Promyelocytes Absolute: 0 %
RBC: 2.71 MIL/uL — AB (ref 4.22–5.81)
RDW: 15.6 % — ABNORMAL HIGH (ref 11.5–15.5)
WBC: 29.9 10*3/uL — AB (ref 4.0–10.5)

## 2017-07-12 LAB — PROTIME-INR
INR: 3.49
Prothrombin Time: 35.8 seconds — ABNORMAL HIGH (ref 11.4–15.2)

## 2017-07-12 LAB — COMPREHENSIVE METABOLIC PANEL
ALBUMIN: 2 g/dL — AB (ref 3.5–5.0)
ALT: 8 U/L — ABNORMAL LOW (ref 17–63)
ANION GAP: 14 (ref 5–15)
AST: 34 U/L (ref 15–41)
Alkaline Phosphatase: 126 U/L (ref 38–126)
BILIRUBIN TOTAL: 4.7 mg/dL — AB (ref 0.3–1.2)
BUN: 77 mg/dL — AB (ref 6–20)
CHLORIDE: 92 mmol/L — AB (ref 101–111)
CO2: 19 mmol/L — ABNORMAL LOW (ref 22–32)
Calcium: 8.2 mg/dL — ABNORMAL LOW (ref 8.9–10.3)
Creatinine, Ser: 2.47 mg/dL — ABNORMAL HIGH (ref 0.61–1.24)
GFR calc Af Amer: 31 mL/min — ABNORMAL LOW (ref >60)
GFR, EST NON AFRICAN AMERICAN: 26 mL/min — AB (ref >60)
GLUCOSE: 252 mg/dL — AB (ref 65–99)
POTASSIUM: 3.7 mmol/L (ref 3.5–5.1)
Sodium: 125 mmol/L — ABNORMAL LOW (ref 135–145)
TOTAL PROTEIN: 6.2 g/dL — AB (ref 6.5–8.1)

## 2017-07-12 LAB — COOXEMETRY PANEL
Carboxyhemoglobin: 1.3 % (ref 0.5–1.5)
Methemoglobin: 1 % (ref 0.0–1.5)
O2 Saturation: 76.5 %
TOTAL HEMOGLOBIN: 16 g/dL (ref 12.0–16.0)

## 2017-07-12 LAB — RENAL FUNCTION PANEL
ALBUMIN: 1.9 g/dL — AB (ref 3.5–5.0)
ANION GAP: 13 (ref 5–15)
Albumin: 1.9 g/dL — ABNORMAL LOW (ref 3.5–5.0)
Anion gap: 15 (ref 5–15)
BUN: 90 mg/dL — AB (ref 6–20)
BUN: 70 mg/dL — ABNORMAL HIGH (ref 6–20)
CHLORIDE: 90 mmol/L — AB (ref 101–111)
CHLORIDE: 94 mmol/L — AB (ref 101–111)
CO2: 21 mmol/L — AB (ref 22–32)
CO2: 21 mmol/L — ABNORMAL LOW (ref 22–32)
Calcium: 8.2 mg/dL — ABNORMAL LOW (ref 8.9–10.3)
Calcium: 8.2 mg/dL — ABNORMAL LOW (ref 8.9–10.3)
Creatinine, Ser: 2.74 mg/dL — ABNORMAL HIGH (ref 0.61–1.24)
Creatinine, Ser: 2.1 mg/dL — ABNORMAL HIGH (ref 0.61–1.24)
GFR calc Af Amer: 27 mL/min — ABNORMAL LOW (ref >60)
GFR, EST AFRICAN AMERICAN: 37 mL/min — AB (ref >60)
GFR, EST NON AFRICAN AMERICAN: 23 mL/min — AB (ref >60)
GFR, EST NON AFRICAN AMERICAN: 32 mL/min — AB (ref >60)
GLUCOSE: 252 mg/dL — AB (ref 65–99)
Glucose, Bld: 206 mg/dL — ABNORMAL HIGH (ref 65–99)
PHOSPHORUS: 3.2 mg/dL (ref 2.5–4.6)
POTASSIUM: 3.5 mmol/L (ref 3.5–5.1)
POTASSIUM: 3.8 mmol/L (ref 3.5–5.1)
Phosphorus: 4.1 mg/dL (ref 2.5–4.6)
Sodium: 126 mmol/L — ABNORMAL LOW (ref 135–145)
Sodium: 128 mmol/L — ABNORMAL LOW (ref 135–145)

## 2017-07-12 LAB — GLUCOSE, CAPILLARY
GLUCOSE-CAPILLARY: 328 mg/dL — AB (ref 65–99)
GLUCOSE-CAPILLARY: 221 mg/dL — AB (ref 65–99)
GLUCOSE-CAPILLARY: 190 mg/dL — AB (ref 65–99)
Glucose-Capillary: 268 mg/dL — ABNORMAL HIGH (ref 65–99)

## 2017-07-12 LAB — CULTURE, BLOOD (ROUTINE X 2)
SPECIAL REQUESTS: ADEQUATE
Special Requests: ADEQUATE

## 2017-07-12 LAB — IRON AND TIBC
IRON: 28 ug/dL — AB (ref 45–182)
Saturation Ratios: 12 % — ABNORMAL LOW (ref 17.9–39.5)
TIBC: 231 ug/dL — ABNORMAL LOW (ref 250–450)
UIBC: 203 ug/dL

## 2017-07-12 LAB — PROCALCITONIN: Procalcitonin: 2.78 ng/mL

## 2017-07-12 LAB — FERRITIN: Ferritin: 238 ng/mL (ref 24–336)

## 2017-07-12 LAB — MAGNESIUM: MAGNESIUM: 2.5 mg/dL — AB (ref 1.7–2.4)

## 2017-07-12 MED ORDER — RIFAMPIN 300 MG PO CAPS
300.0000 mg | ORAL_CAPSULE | Freq: Every day | ORAL | Status: DC
  Administered 2017-07-12: 300 mg via ORAL
  Filled 2017-07-12 (×3): qty 1

## 2017-07-12 MED ORDER — HYDROCODONE-ACETAMINOPHEN 7.5-325 MG PO TABS
1.0000 | ORAL_TABLET | ORAL | Status: DC | PRN
  Administered 2017-07-12: 1 via ORAL
  Administered 2017-07-13 – 2017-07-16 (×9): 2 via ORAL
  Administered 2017-07-16 (×2): 1 via ORAL
  Administered 2017-07-17 – 2017-07-24 (×19): 2 via ORAL
  Administered 2017-07-26 – 2017-07-29 (×8): 1 via ORAL
  Administered 2017-07-30 – 2017-07-31 (×2): 2 via ORAL
  Administered 2017-07-31 (×2): 1 via ORAL
  Administered 2017-08-01 – 2017-08-02 (×2): 2 via ORAL
  Filled 2017-07-12: qty 1
  Filled 2017-07-12 (×4): qty 2
  Filled 2017-07-12: qty 1
  Filled 2017-07-12 (×3): qty 2
  Filled 2017-07-12: qty 1
  Filled 2017-07-12 (×14): qty 2
  Filled 2017-07-12: qty 1
  Filled 2017-07-12 (×2): qty 2
  Filled 2017-07-12: qty 1
  Filled 2017-07-12: qty 2
  Filled 2017-07-12 (×3): qty 1
  Filled 2017-07-12: qty 2
  Filled 2017-07-12: qty 1
  Filled 2017-07-12 (×3): qty 2
  Filled 2017-07-12: qty 1
  Filled 2017-07-12: qty 2
  Filled 2017-07-12: qty 1
  Filled 2017-07-12: qty 2
  Filled 2017-07-12 (×2): qty 1
  Filled 2017-07-12 (×3): qty 2
  Filled 2017-07-12: qty 1

## 2017-07-12 MED ORDER — INSULIN ASPART 100 UNIT/ML ~~LOC~~ SOLN
0.0000 [IU] | Freq: Every day | SUBCUTANEOUS | Status: DC
  Administered 2017-07-13 – 2017-07-16 (×3): 2 [IU] via SUBCUTANEOUS
  Administered 2017-07-17: 3 [IU] via SUBCUTANEOUS

## 2017-07-12 MED ORDER — INSULIN ASPART 100 UNIT/ML ~~LOC~~ SOLN
3.0000 [IU] | Freq: Three times a day (TID) | SUBCUTANEOUS | Status: DC
  Administered 2017-07-12 – 2017-08-02 (×46): 3 [IU] via SUBCUTANEOUS

## 2017-07-12 NOTE — Progress Notes (Signed)
Patient ID: [name], male   DOB: Apr 21, 1956, 60 y.o.   MRN: [id number]     Advanced Heart Failure Rounding Note  Primary Cardiologist: Bensimhon  Subjective:    Admitted with fatigue, dyspnea, recurrent lower extremity swelling and decreased UOP.   Yesterday started on CRRT.  Complaining of fatigue. Bloated.   Blood CX- 2/2 MSSA ? From LLE wounds? . WBC stable 30k.  CT 07/11/2017  1. No acute fracture nor dislocation of either hip. No evidence of AVN. Probable right-sided gluteal tendinopathy. 2. The bones are demineralized in appearance. There is generalized muscle atrophy about the right hip. 3. Anasarca with moderate abdominopelvic ascites. 4. Moderate to large bilateral hydroceles.  Objective:   Weight Range: 202 lb 13.2 oz (92 kg) Body mass index is 29.1 kg/m.   Vital Signs:   Temp:  [96.3 F (35.7 C)-97.7 F (36.5 C)] 96.3 F (35.7 C) (08/07 0345) Pulse Rate:  [67-79] 73 (08/07 0700) Resp:  [12-27] 16 (08/07 0700) BP: (93-130)/(43-113) 105/54 (08/07 0630) SpO2:  [95 %-100 %] 100 % (08/07 0700) Weight:  [202 lb 6.1 oz (91.8 kg)-202 lb 13.2 oz (92 kg)] 202 lb 13.2 oz (92 kg) (08/07 0615) Last BM Date: 07/11/17  Weight change: Filed Weights   07/11/17 0530 07/12/17 0345 07/12/17 0615  Weight: 199 lb 11.8 oz (90.6 kg) 202 lb 6.1 oz (91.8 kg) 202 lb 13.2 oz (92 kg)    Intake/Output:   Intake/Output Summary (Last 24 hours) at 07/12/17 0816 Last data filed at 07/12/17 0700  Gross per 24 hour  Intake           1429.4 ml  Output             1831 ml  Net           -401.6 ml      Physical Exam   General:  Appears fatigued. No resp difficulty. On CRRT HEENT: normal Neck: supple. RIJ trialysis cath JVP to jaw.  Carotids 2+ bilat; no bruits. No lymphadenopathy or thryomegaly appreciated. Cor: PMI nondisplaced. Irregular rate & rhythm. No rubs, gallops or murmurs. Lungs: RML RLL LLLCrackles on 2 liters  Abdomen: soft, nontender, distended. No  hepatosplenomegaly. No bruits or masses. Good bowel sounds. LLQ colostomy  Extremities: no cyanosis, clubbing, rash, R and LLE SCDs. R and LLE 2+ edema.  Neuro: alert & orientedx3, cranial nerves grossly intact. moves all 4 extremities w/o difficulty. Affect pleasant SKin: LLE foam dressing.    Telemetry   A fib 70s personally reviewed.    Labs    CBC  Recent Labs  07/10/17 1115 07/11/17 0335 07/12/17 0424  WBC 28.0* 31.2* 29.9*  NEUTROABS 26.0*  --  28.1*  HGB 8.3* 8.1* 7.9*  HCT 25.2* 24.4* 24.2*  MCV 92.6 91.0 89.3  PLT 175 193 456   Basic Metabolic Panel  Recent Labs  07/10/17 0200  07/11/17 0335 07/11/17 1324 07/12/17 0426  NA  --   < > 124* 121* 126*  K  --   < > 3.7 3.6 3.5  CL  --   < > 90* 90* 90*  CO2  --   < > 19* 17* 21*  GLUCOSE  --   < > 263* 309* 252*  BUN  --   < > 109* 109* 90*  CREATININE  --   < > 3.69* 3.51* 2.74*  CALCIUM  --   < > 8.6* 8.4* 8.2*  MG  --   --  2.2  --  2.5*  PHOS 4.6  --   --   --  4.1  < > = values in this interval not displayed. Liver Function Tests  Recent Labs  07/09/17 2132 07/10/17 1115 07/12/17 0426  AST 45* 47*  --   ALT 14* 16*  --   ALKPHOS 98 96  --   BILITOT 2.5* 2.8*  --   PROT 6.4* 6.4*  --   ALBUMIN 2.2* 2.1* 1.9*   No results for input(s): LIPASE, AMYLASE in the last 72 hours. Cardiac Enzymes  Recent Labs  07/10/17 0200 07/10/17 1115 07/10/17 1800  TROPONINI 0.11* 1.05* 41.84*    BNP: BNP (last 3 results)  Recent Labs  06/13/17 1205 07/09/17 2151  BNP 1,151.0* 1,553.9*    ProBNP (last 3 results) No results for input(s): PROBNP in the last 8760 hours.   D-Dimer No results for input(s): DDIMER in the last 72 hours. Hemoglobin A1C No results for input(s): HGBA1C in the last 72 hours. Fasting Lipid Panel No results for input(s): CHOL, HDL, LDLCALC, TRIG, CHOLHDL, LDLDIRECT in the last 72 hours. Thyroid Function Tests No results for input(s): TSH, T4TOTAL, T3FREE, THYROIDAB in  the last 72 hours.  Invalid input(s): FREET3  Other results:   Imaging    Ct Hip Right Wo Contrast  Result Date: 07/12/2017 CLINICAL DATA:  Extreme right hip pain and bilateral lower extremity swelling without known injury. EXAM: CT OF THE RIGHT HIP WITHOUT CONTRAST TECHNIQUE: Multidetector CT imaging of the right hip was performed according to the standard protocol. Multiplanar CT image reconstructions were also generated. COMPARISON:  CT abdomen and pelvis from 1 day prior. FINDINGS: Bones/Joint/Cartilage Osteopenic appearance of the included pelvis and right hip without fracture or bone destruction. There is lower lumbar facet arthropathy with facet joint space narrowing, sclerosis and hypertrophy. No flattening of the included femoral heads. No joint dislocations. Hypertrophic degenerative changes are seen of both acetabular roofs. Small sclerotic focus of the left acetabular roof is noted, nonspecific possibly a bone island in isolation. No other osseous lesions are apparent. Right-sided gluteal calcific tendinopathy. Ligaments Suboptimally assessed by CT. Muscles and Tendons Generalized muscle atrophy without intramuscular hemorrhage or mass its. Soft tissues There is diffuse anasarca with abdominopelvic ascites. Aortoiliofemoral and branch vessel atherosclerosis without aneurysm. Large intrascrotal hydroceles. Brachytherapy seeds in the prostate. IMPRESSION: 1. No acute fracture nor dislocation of either hip. No evidence of AVN. Probable right-sided gluteal tendinopathy. 2. The bones are demineralized in appearance. There is generalized muscle atrophy about the right hip. 3. Anasarca with moderate abdominopelvic ascites. 4. Moderate to large bilateral hydroceles. Electronically Signed   By: Klebba Royalty M.D.   On: 07/12/2017 03:04     Medications:     Scheduled Medications: . aspirin  325 mg Oral Daily  . atorvastatin  40 mg Oral q1800  . Chlorhexidine Gluconate Cloth  6 each Topical Daily   . colchicine  0.3 mg Oral Daily  . insulin aspart  0-9 Units Subcutaneous TID WC  . sodium chloride flush  10-40 mL Intracatheter Q12H    Infusions: . sodium chloride Stopped (07/11/17 2000)  .  ceFAZolin (ANCEF) IV Stopped (07/12/17 6045)  . heparin    . norepinephrine (LEVOPHED) Adult infusion 8.96 mcg/min (07/12/17 0700)  . dialysis replacement fluid (prismasate) 300 mL/hr at 07/11/17 2202  . dialysis replacement fluid (prismasate) 200 mL/hr at 07/11/17 2201  . dialysate (PRISMASATE) 1,000 mL/hr at 07/11/17 2200  . rifampin (RIFADIN) IVPB Stopped (07/12/17 0320)    PRN  Medications: sodium chloride, acetaminophen, alum & mag hydroxide-simeth, fentaNYL (SUBLIMAZE) injection, heparin, heparin, heparin, magic mouthwash w/lidocaine, ondansetron (ZOFRAN) IV, sodium chloride flush    Patient Profile   [name] is a 61 year old with a history of DMII, [place] 2016, rectal cancer, CKD Stage IV, chronic A fib, S/P AVN ablation, PAF, CAD BMS LAD 2011 BMS LAD 2009, OSA, chronic systolic heart failure, St Jude BiV ICD admitted with AKI and shock with elevated lactate, suspect cardiogenic.   Assessment/Plan   1. Acute on chronic systolic CHF/cardiogenic shock: Last echo in 8/17 with EF 15-20%.  Ischemic cardiomyopathy, has St Jude BiV ICD (despite chronic afib appears to be effectively BiV pacing).  Had recent admission (discharged 06/21/17) requiring milrinone support and diuresis.  Admitted with soft BP (SBP 80s-90s) + elevated lactate.  Some concern initially for PNA/septic shock, but suspect CXR shows R effusion rather than PNA, no fever.   Started on CRRT-. Off lasix drip. Remains on norepi 9 mcg.  CO-OX 76%.  2. AKI on CKD stage 4: Baseline creatinine around 2.5.  He required brief HD in the past, has RUE fistula.Started CRRT 8/6. 3. Atrial fibrillation: Chronic.  On warfarin, INR  3.49. Warfarin per pharmacy.   4. CAD: No CP. He can continue home statin.  5. ID: WBC 29.  Covering with  vanc/Zosyn for PNA given initial concern for septic shock, but suspect R-sided pleural effusion and cardiogenic shock. Blood CX 2/2 MSSA. Antibiotics narrowed rifampin and ancef. Has BiV.  Eventually will need TEE 6. H/O Rectal CA with diverting colostomy  7. Type II diabetes: Sliding scale.    Length of Stay: 2  Darrick Grinder, NP  07/12/2017, 8:16 AM  Advanced Heart Failure Team Pager [phone] (M-F; 7a - 4p)  Please contact [facility] for night-coverage after hours (4p -7a ) and weekends on [email or web address]  Agree with above.   He remains critically ill. Now on CVVHD. Volume status improving. Norepi requirement now up to 11. Co-ox ox.   WBC remains elevated but afebrile. R hip CT no evidence of infection.   On exam  Weak appearing RIJ trialysis JVP up Cor Irr Lungs mild crackles Ab bloated +colostomy Ext 2+ edema   Case d/w CCM and EP. Remains critically ill. Continue CVVHD and broad spectrum abx. Will plan TEE on Thursday. Will need to decide if he is candidate for ICD extraction versus suppressive abx,   If deteriorates will need comfort care.   CRITICAL CARE Performed by: Glori Bickers  Total critical care time: 35 minutes  Critical care time was exclusive of separately billable procedures and treating other patients.  Critical care was necessary to treat or prevent imminent or life-threatening deterioration.  Critical care was time spent personally by me (independent of midlevel providers or residents) on the following activities: development of treatment plan with patient and/or surrogate as well as nursing, discussions with consultants, evaluation of patient's response to treatment, examination of patient, obtaining history from patient or surrogate, ordering and performing treatments and interventions, ordering and review of laboratory studies, ordering and review of radiographic studies, pulse oximetry and re-evaluation of patient's condition.  Glori Bickers, MD    4:16 PM

## 2017-07-12 NOTE — Progress Notes (Addendum)
[facility] Admit Date: 07/09/2017 Bethany Molt, DO Length of Stay: 2  Primary Nephrologist: Dr. Hinda Lenis Requesting Physician:  Lake Bells, MD Reason for Consult:  Acute on Chronic Renal Failure  SUBJECTIVE: Brief HPI: 61 y/o M w/ Systolic CHF s/p AICD placement (most recent LVEF 15-20% 8/5), advanced CKD with RUE AVF placement, CAD with hx of MI and stents, Chronic atrial fibrillation, type 2 diabetes, cirrhosis and history of rectal cancer s/p resection and colostomy placement admitted 07/09/17 with respiratory failure, demand ischemia (Trop 40) and volume overload. He presented in shock, felt to be mixed cardiogenic and septic and was found to have MSSA bacteremia on admission blood cultures. UOP trailed off despite lasix drip. Recently admitted 7/9-7/17 for volume overload and was diuresed with IV lasix, metolazone and milrinone until day of discharge when he was sent home with home dose of torsemide. Developed SOB, worsening edema and has gained nearly 30 lbs since discharge. He sees Dr. Hinda Lenis as an outpatient and required HD last year from July until November 2017 for AKI in CKD 2/2 ischemic ATN.  NSAIDS: YES- Ketorolac in past 2 weeks IV Contrast No recent TMP/SMX YES- in past 2 weeks Hypotension YES, current  Interval events: Started on CRRT overnight. Pulling 100-180 mls/hr. Some UOP. Remains on Levophed. Feels about the same as yesterday. Continues to complain of right hip pain however aware CT didn't show infection in the joint. No questions.   ROS Balance of 12 systems is negative w/ exceptions as above  OBJECTIVE UOP: 975 CRRT: 991  Filed Weights   07/11/17 0530 07/12/17 0345 07/12/17 0615  Weight: 199 lb 11.8 oz (90.6 kg) 202 lb 6.1 oz (91.8 kg) 202 lb 13.2 oz (92 kg)   Physical Exam   Vitals: Blood pressure (!) 94/56, pulse 73, temperature 97.6 F (36.4 C), temperature source Oral, resp. rate 17, height 5\' 10"  (1.778 m), weight 202lbs 13.2 oz, SpO2 100 %. GEN:  Chronically-ill appearing caucasian male resting in bed. Wife present and tearful at bedside. Fatigued, appears weak. Generalized anasarca with pitting edema abd wall/to armpits Scleral icterus HENT: [place], AT. EOMI.  CV: RRR. Right IJ temp HD cath (placed 8/5).  PULM: BL bibasilar crackles. Speaks in short sentences.   ABD: Protuberant. Pitting edema to axilla BL. +bs Colostomy EXT: RUE fistula with good thrill and bruit. No surrounding erythema or warmth. Woody changes BL LE c/w chronic edema.   Current Medications Scheduled Meds: . aspirin  325 mg Oral Daily  . atorvastatin  40 mg Oral q1800  . Chlorhexidine Gluconate Cloth  6 each Topical Daily  . colchicine  0.3 mg Oral Daily  . insulin aspart  0-9 Units Subcutaneous TID WC  . sodium chloride flush  10-40 mL Intracatheter Q12H   Continuous Infusions: . sodium chloride 250 mL (07/12/17 1000)  .  ceFAZolin (ANCEF) IV Stopped (07/12/17 0998)  . heparin    . norepinephrine (LEVOPHED) Adult infusion 8.96 mcg/min (07/12/17 1000)  . dialysis replacement fluid (prismasate) 300 mL/hr at 07/11/17 2202  . dialysis replacement fluid (prismasate) 200 mL/hr at 07/12/17 0824  . dialysate (PRISMASATE) 1,000 mL/hr at 07/12/17 0826  . rifampin (RIFADIN) IVPB Stopped (07/12/17 0934)    Recent Labs Lab 07/09/17 2132  07/10/17 1115 07/11/17 0335 07/12/17 0424  WBC 18.1*  --  28.0* 31.2* 29.9*  NEUTROABS 16.8*  --  26.0*  --  28.1*  HGB 8.2*  < > 8.3* 8.1* 7.9*  HCT 24.9*  < > 25.2* 24.4* 24.2*  MCV 92.6  --  92.6 91.0 89.3  PLT 158  --  175 193 172  < > = values in this interval not displayed.  Recent Labs Lab 07/09/17 2132 07/09/17 2212 07/10/17 0200 07/10/17 1115 07/10/17 1947 07/11/17 0335 07/11/17 1324 07/12/17 0426  NA 124* 124*  --  126* 124* 124* 121* 126*  K 3.7 3.7  --  3.8 3.5 3.7 3.6 3.5  CL 88* 89*  --  91* 89* 90* 90* 90*  CO2 20*  --   --  16* 20* 19* 17* 21*  GLUCOSE 282* 280*  --  252* 268* 263* 309* 252*  BUN  106* 97*  --  104* 106* 109* 109* 90*  CREATININE 3.75* 3.70*  --  3.68* 3.68* 3.69* 3.51* 2.74*  CALCIUM 8.7*  --   --  8.3* 8.4* 8.6* 8.4* 8.2*  PHOS  --   --  4.6  --   --   --   --  4.1   ASSESSMENT: This is a chronically-ill 61 y/o M with extensive medical history here with respiratory failure, acute on chronic renal failure, anasarca, shock and MSSA bacteremia. He was recently admitted and diuresed with IV lasix, metolazone and milrinone. He is now 30 lbs up from his dry weight just 2 weeks after discharge. He has been persistently hypotensive requiring levophed and had poor UOP on high-dose lasix. Started on CRRT overnight 8/6 and is currently pulling ~100-148mls/hr. (Creatinine 2.5 on 06/21/17)  1. 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 Had poor response to high-dose lasix and was started on CRRT overnight 8/6 as patient hypotensive and not responding well to diuresis. Pulling about 100-184mls/hr via right temp HD cath and pt feels about the same as yesterday. CW 202 lbs,was 200 yesterday. Dry weight ~175 lbs. Does have right arm AVF however hold off using while on CRRT 2. Cardiogenic & Septic Shock requiring pressor support, NSTEMI Remains on levophed. Septic shock due to infectious process described below.  3. MSSA Bacteremia, BL LE wounds: ID On. Afebrile on Cefazolin and Rifampin. CT abdomen and right hip without infectious source.  4. ICM with AICD placement  Due to MSSA bacteremia, would benefit from line holiday. Currently not in stable position to tolerate this.  5. Anemia: Hb 7.9 today, was 8.8 on admission. Iron studies reveal Fe 28, TIBC 231, Sat ratio 12 and Ferritin of 238. He will require IV iron replacement once febrile illness resolves.  6. Chronic Atrial Fibrillation: On Heparin.  7. Hyponatremia: Sodium 126 today. K 3.5 and phos 4.1. Mag 2.5. Corrected calcium normal.  8. ? Scleral icterus. H/o cirrhosis. Adding  liver panel to labs   PLAN -Continue CRRT -BP support per primary -Daily weights, Strict I&O -Daily renal function panels, replacing lytes as indicated.  -MSSA bacteremia tx per ID, on Ancef and Rifampin.  -PTH pending -Will need iron replacement once infection better controlled  Einar Gip, DO Internal Medicine - PGY2 07/12/2017, 7:46 AM   I have seen and examined this patient and agree with plan and assessment in the above note with renal recommendations/intervention highlighted. CRRT started around 10 PM last night, so not much vol off yet but net neg 100-150/hour and anticipate will see weight start to come down next 24 hours. Hyponatremia improving. K and phos stable. Using no heparin with CRRT (on systemic). Looks icteric on exam, ordering LFT's.  Teiana Hajduk B,MD 07/12/2017 10:48 AM

## 2017-07-12 NOTE — Progress Notes (Signed)
[place] [facility]  Date of Admission:  07/09/2017              Total days of antibiotics 3        Day 3    Cefazolin        Day 3    Rifampin         ASSESSMENT and PLAN:  MSSA Bacteremia  -Blood culture and biofire both resulted for MSSA -Pending TEE to visualize for possible vegetations -The patient has a AICD that is likely to be infected in the setting of MSSA bacteremia -Consider speaking with palliative care regarding goals of care considering the possibility of having to take BiV ICD out with presumed infection to the device.  -Continue cefazolin and rifampin. Will switch to po rifampin due to poor peripheral access.  -Provide catheter holiday whenever possible -Please wait for no growth for 4 days prior to placing any lines  Right Hip Pain Ct Hip w/o contrast did not show any presence of infection. However, since the CT was without contrast it cannot be ruled out. If high suspicion still exists for infection needle arthroscopy can be tried.   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 aspirin  325 mg Oral Daily  . atorvastatin  40 mg Oral q1800  . Chlorhexidine Gluconate Cloth  6 each Topical Daily  . colchicine  0.3 mg Oral Daily  . insulin  aspart  0-5 Units Subcutaneous QHS  . insulin aspart  0-9 Units Subcutaneous TID WC  . insulin aspart  3 Units Subcutaneous TID WC  . sodium chloride flush  10-40 mL Intracatheter Q12H    SUBJECTIVE: [name] was seen laying in bed this morning. He stated that his sob has improved with his crrt. He denies any chest pain. He says he continues to have pain in his right hip.   Review of Systems: ROS as above   No Known Allergies  OBJECTIVE: Vitals:   07/12/17 0645 07/12/17 0700 07/12/17 0736 07/12/17 0800  BP:  (!) 105/58  (!) 107/50  Pulse: 74 73 73 73  Resp: 15 16 20 20   Temp:    (!) 97.5 F (36.4 C)  TempSrc:    Oral  SpO2: 100% 100% 100% 100%  Weight:      Height:       Body mass index is 29.1 kg/m.  Physical Exam  Constitutional: He appears lethargic. He appears unhealthy. He appears distressed.  HENT:  Head: Normocephalic and atraumatic.  Cardiovascular: Normal rate, regular rhythm, normal heart sounds and intact distal pulses.   No murmur heard. Pulmonary/Chest: Effort normal and breath sounds normal. No respiratory distress. He has no wheezes.  Abdominal:  Soft. Bowel sounds are normal. He exhibits distension. There is tenderness.  Neurological: He appears lethargic.  Skin: Ecchymosis and petechiae noted.    Lab Results Lab Results  Component Value Date   WBC 29.9 (H) 07/12/2017   HGB 7.9 (L) 07/12/2017   HCT 24.2 (L) 07/12/2017   MCV 89.3 07/12/2017   PLT 172 07/12/2017    Lab Results  Component Value Date   CREATININE 2.74 (H) 07/12/2017   BUN 90 (H) 07/12/2017   NA 126 (L) 07/12/2017   K 3.5 07/12/2017   CL 90 (L) 07/12/2017   CO2 21 (L) 07/12/2017    Lab Results  Component Value Date   ALT 16 (L) 07/10/2017   AST 47 (H) 07/10/2017   ALKPHOS 96 07/10/2017   BILITOT 2.8 (H) 07/10/2017     Microbiology: Recent Results (from the past 240 hour(s))  Blood culture (routine x 2)     Status: Abnormal   Collection Time: 07/09/17 10:04 PM  Result  Value Ref Range Status   Specimen Description BLOOD LEFT HAND  Final   Special Requests IN PEDIATRIC BOTTLE Blood Culture adequate volume  Final   Culture  Setup Time   Final    GRAM POSITIVE COCCI IN CLUSTERS IN PEDIATRIC BOTTLE CRITICAL RESULT CALLED TO, READ BACK BY AND VERIFIED WITH: R RUMBARGER,PHARMD AT 1613 07/10/17 BY L BENFIELD    Culture (A)  Final    STAPHYLOCOCCUS AUREUS SUSCEPTIBILITIES PERFORMED ON PREVIOUS CULTURE WITHIN THE LAST 5 DAYS.    Report Status 07/12/2017 FINAL  Final  Blood Culture ID Panel (Reflexed)     Status: Abnormal   Collection Time: 07/09/17 10:04 PM  Result Value Ref Range Status   Enterococcus species NOT DETECTED NOT DETECTED Final   Listeria monocytogenes NOT DETECTED NOT DETECTED Final   Staphylococcus species DETECTED (A) NOT DETECTED Final    Comment: CRITICAL RESULT CALLED TO, READ BACK BY AND VERIFIED WITH: R RUMBARGER,PHARMD AT 1613 07/10/17 BY L BENFIELD    Staphylococcus aureus DETECTED (A) NOT DETECTED Final    Comment: Methicillin (oxacillin) susceptible Staphylococcus aureus (MSSA). Preferred therapy is anti staphylococcal beta lactam antibiotic (Cefazolin or Nafcillin), unless clinically contraindicated. CRITICAL RESULT CALLED TO, READ BACK BY AND VERIFIED WITH: R RUMBARGER,PHARMD AT 1613 07/10/17 BY L BENFIELD    Methicillin resistance NOT DETECTED NOT DETECTED Final   Streptococcus species NOT DETECTED NOT DETECTED Final   Streptococcus agalactiae NOT DETECTED NOT DETECTED Final   Streptococcus pneumoniae NOT DETECTED NOT DETECTED Final   Streptococcus pyogenes NOT DETECTED NOT DETECTED Final   Acinetobacter baumannii NOT DETECTED NOT DETECTED Final   Enterobacteriaceae species NOT DETECTED NOT DETECTED Final   Enterobacter cloacae complex NOT DETECTED NOT DETECTED Final   Escherichia coli NOT DETECTED NOT DETECTED Final   Klebsiella oxytoca NOT DETECTED NOT DETECTED Final   Klebsiella pneumoniae NOT DETECTED NOT DETECTED Final    Proteus species NOT DETECTED NOT DETECTED Final   Serratia marcescens NOT DETECTED NOT DETECTED Final   Haemophilus influenzae NOT DETECTED NOT DETECTED Final   Neisseria meningitidis NOT DETECTED NOT DETECTED Final   Pseudomonas aeruginosa NOT DETECTED NOT DETECTED Final   Candida albicans NOT DETECTED NOT DETECTED Final   Candida glabrata NOT DETECTED NOT DETECTED Final   Candida krusei NOT DETECTED NOT DETECTED Final   Candida parapsilosis NOT DETECTED NOT DETECTED Final   Candida tropicalis NOT DETECTED NOT DETECTED Final  Blood culture (routine x 2)     Status: Abnormal   Collection Time:  07/09/17 10:08 PM  Result Value Ref Range Status   Specimen Description BLOOD LEFT FOREARM  Final   Special Requests   Final    BOTTLES DRAWN AEROBIC AND ANAEROBIC Blood Culture adequate volume   Culture  Setup Time   Final    GRAM POSITIVE COCCI IN CLUSTERS IN BOTH AEROBIC AND ANAEROBIC BOTTLES CRITICAL RESULT CALLED TO, READ BACK BY AND VERIFIED WITH: R RUMBARGER,PHARMD AT 8887 07/10/17 BY L BENFIELD    Culture STAPHYLOCOCCUS AUREUS (A)  Final   Report Status 07/12/2017 FINAL  Final   Organism ID, Bacteria STAPHYLOCOCCUS AUREUS  Final      Susceptibility   Staphylococcus aureus - MIC*    CIPROFLOXACIN <=0.5 SENSITIVE Sensitive     ERYTHROMYCIN <=0.25 SENSITIVE Sensitive     GENTAMICIN <=0.5 SENSITIVE Sensitive     OXACILLIN 0.5 SENSITIVE Sensitive     TETRACYCLINE <=1 SENSITIVE Sensitive     VANCOMYCIN <=0.5 SENSITIVE Sensitive     TRIMETH/SULFA <=10 SENSITIVE Sensitive     CLINDAMYCIN <=0.25 SENSITIVE Sensitive     RIFAMPIN <=0.5 SENSITIVE Sensitive     Inducible Clindamycin NEGATIVE Sensitive     * STAPHYLOCOCCUS AUREUS  Urine culture     Status: None   Collection Time: 07/09/17 10:15 PM  Result Value Ref Range Status   Specimen Description URINE, CATHETERIZED  Final   Special Requests NONE  Final   Culture NO GROWTH  Final   Report Status 07/11/2017 FINAL  Final  MRSA PCR  Screening     Status: None   Collection Time: 07/10/17  2:48 AM  Result Value Ref Range Status   MRSA by PCR NEGATIVE NEGATIVE Final    Comment:        The GeneXpert MRSA Assay (FDA approved for NASAL specimens only), is one component of a comprehensive MRSA colonization surveillance program. It is not intended to diagnose MRSA infection nor to guide or monitor treatment for MRSA infections.     Lars Mage, [place] [facility] [place] [phone] pager   [phone] cell 07/12/2017, 11:15 AM

## 2017-07-12 NOTE — Consult Note (Signed)
ELECTROPHYSIOLOGY CONSULT NOTE    Patient ID: [name] MRN: [id number], DOB/AGE: 1956-11-25 61 y.o.  Admit date: 07/09/2017 Date of Consult: 07/14/2017  Primary Physician: Redmond School, MD Primary Cardiologist: Branch Heart Failure: Meyer Electrophysiologist: Lovena Le  Patient Profile: [name] is a 61 y.o. male with a history of  DMII, [place] 2016, rectal cancer, CKD Stage IV, chronic A fib, S/P AVN ablation, PAF, CAD BMS LAD 2011 BMS LAD 2009, OSA, chronic systolic heart failure, St Jude BiV ICD admitted with AKI and shock  who is being seen today for the evaluation of MSSA bacteremia at the request of Dr Lake Bells.  HPI:  [name] is a 61 y.o. male with a past medical history as outlined above. He has been found to have MSSA bacteremia and cardiogenic shock. He has been treated by PCCM as well as the AHF team.  ID has also been consulted.   EP has been asked to evaluate in the setting of implanted device. He is s/p AVN ablation. He presented to the hospital with fatigue, increased shortness of breath, lower extremity edema, and decreased urine output. Renal function has worsened and he is now requiring CRRT.  Plans for TEE have been postponed due to his clinical illness.  Dr Ante Medal has been clear about prognosis being poor.  Presently, the patient is ill and unable to provide further history.  Past Medical History:  Diagnosis Date  . Adenocarcinoma of rectum ([place])    a. 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 AICD (automatic cardioverter/defibrillator) present 2002   BI V ICD  . Anemia   . CAD (coronary artery disease)    a. BMS to LAD 2001 at [facility] b. PTCA/atherectomy ramus and BMS to LAD 2009  . CHF (congestive heart failure) ([place])   . Cholelithiasis 06/2015  . Chronic kidney disease, stage IV (severe) ([place])   . Chronic systolic heart failure ([place])   . Cirrhosis ([place])   . Colostomy in place [facility])   . Dizziness    a. chronic. Admission for this 07/18/2014  . DM type 2,  uncontrolled, with renal complications ([place])   . Dysrhythmia   . Essential hypertension, benign   . GERD (gastroesophageal reflux disease)   . HCAP (healthcare-associated pneumonia) 07/21/2015  . Hematuria   . History of blood transfusion    "I've had 2 units so far this year" (09/27/2015)  . HLD (hyperlipidemia)   . Ischemic cardiomyopathy    EF 18% by nuclear study 2016, multiple myocardial infarctions in past    . Myocardial infarction ([place]) 2001  . Obesity   . Orthostatic hypotension   . OSA on CPAP   . Paroxysmal atrial fibrillation (H[facility])    a. on amiodarone, digoxin and Eliquis  . PONV (postoperative nausea and vomiting)   . Prostate cancer ( [facility])    a. s/p seed implants with chemo and radiation  . SAH (subarachnoid hemorrhage) ([place])    post-traumatic (fall) [facility] 12/2014  . TIA (transient ischemic attack)      Surgical History:  Past Surgical History:  Procedure Laterality Date  . Abdominal and Perineal Resection of Rectum with Total Mesorectal Excision  10/04/2007  . AV FISTULA PLACEMENT Right 09/16/2015   Procedure: ARTERIOVENOUS (AV) FISTULA CREATION - BRACHIOCEPHALIC;  Surgeon: Elam Dutch, MD;  Location: [place];  Service: Vascular;  Laterality: Right;  . BI-VENTRICULAR IMPLANTABLE CARDIOVERTER DEFIBRILLATOR  (CRT-D)  2009  . CARDIAC CATHETERIZATION  08/2001; 2009   ; [name] 07/10/2013  . CARDIAC DEFIBRILLATOR  PLACEMENT  2002  . CARDIAC DEFIBRILLATOR PLACEMENT  2009   Upgraded to a BiV ICD  . COLONOSCOPY  09/14/2011   Dr. Gala Romney: via colostomy, Single pedunculated benign inflammatory polyp. Due for surveillance Oct 2015  . COLONOSCOPY N/A 07/02/2014   Procedure: COLONOSCOPY;  Surgeon: Daneil Dolin, MD;  [facility];  Service: Endoscopy;  Laterality: N/A;  7:30 / COLONOSCOPY THRU COLOSTOMY  . COLONOSCOPY N/A 12/11/2014   Dr. Gala Romney via colostomy. Normal. Repeat in 2021.   Marland Kitchen COLONOSCOPY N/A 08/24/2015   Dr. Havery Moros: diminutive polyp not removed, stoma site  of bleeding  . COLOSTOMY  2008  . CORONARY ANGIOPLASTY WITH STENT PLACEMENT  2001; ~ 2006   "1 + 1"   . ELECTROPHYSIOLOGIC STUDY N/A 08/28/2015   Procedure: AV Node Ablation;  Surgeon: Will Meredith Leeds, MD;  Location: [place] CV LAB;  Service: Cardiovascular;  Laterality: N/A;  . EP IMPLANTABLE DEVICE N/A 04/10/2015   Procedure: Ppm/Biv Ppm Generator Changeout;  Surgeon: Evans Lance, MD;  Location: [place] INVASIVE CV LAB CUPID;  Service: Cardiovascular;  Laterality: N/A;  . ERCP N/A 06/11/2016   Procedure: ENDOSCOPIC RETROGRADE CHOLANGIOPANCREATOGRAPHY (ERCP);  Surgeon: Teena Irani, MD;  Location: [facility] ENDOSCOPY;  Service: Endoscopy;  Laterality: N/A;  . ERCP N/A 01/26/2017   Procedure: ENDOSCOPIC RETROGRADE CHOLANGIOPANCREATOGRAPHY (ERCP);  Surgeon: Teena Irani, MD;  Location[facility] ENDOSCOPY;  Service: Endoscopy;  Laterality: N/A;  . ERCP  06/2016   Dr. Amedeo Plenty: duodenal fistula, dilated bile duct but no obvious stones, s/p sphincterotomy and stent placement  . ERCP  01/2017   Dr. Amedeo Plenty: CBD stones, s/p sphincterotomy, balloon extraction, stent removal  . ESOPHAGOGASTRODUODENOSCOPY N/A 07/02/2014   Procedure: ESOPHAGOGASTRODUODENOSCOPY (EGD);  Surgeon: Daneil Dolin, MD;  [facility];  Service: Endoscopy;  Laterality: N/A;  7:30  . ESOPHAGOGASTRODUODENOSCOPY N/A 12/11/2014   [id number] EGD  . ESOPHAGOGASTRODUODENOSCOPY (EGD) WITH PROPOFOL N/A 04/18/2017   Procedure: ESOPHAGOGASTRODUODENOSCOPY (EGD) WITH PROPOFOL;  Surgeon: Daneil Dolin, MD;  [facility];  Service: Endoscopy;  Laterality: N/A;  2:30pm  . GASTROINTESTINAL STENT REMOVAL N/A 01/26/2017   Procedure: GASTROINTESTINAL STENT REMOVAL;  Surgeon: Teena Irani, MD;  Location: [place];  Service: Endoscopy;  Laterality: N/A;  . GIVENS CAPSULE STUDY N/A 07/23/2015   Dr. Michail Sermon: small non-bleeding AVM, mild gastritis  . LEFT HEART CATHETERIZATION WITH CORONARY ANGIOGRAM N/A 07/13/2013   Procedure: LEFT HEART  CATHETERIZATION WITH CORONARY ANGIOGRAM;  Surgeon: Lorretta Harp, MD;  Location[facility] C[facility] LAB;  Service: Cardiovascular;  Laterality: N/A;  . Venia Minks DILATION N/A 07/02/2014   Procedure: Venia Minks DILATION;  Surgeon: Daneil Dolin, MD;  [facility];  Service: Endoscopy;  Laterality: N/A;  7:30  . MALONEY DILATION N/A 04/18/2017   Procedure: Venia Minks DILATION;  Surgeon: Daneil Dolin, MD;  [facility];  Service: Endoscopy;  Laterality: N/A;  . PORTACATH PLACEMENT  06/2007   "removed ~ 1 yr later"  . RIGHT HEART CATHETERIZATION N/A 02/24/2015   Procedure: RIGHT HEART CATH;  Surgeon: Jolaine Artist, MD;  Location: [facility] CATH LAB;  Service: Cardiovascular;  Laterality: N/A;  . SAVORY DILATION N/A 07/02/2014   Procedure: SAVORY DILATION;  Surgeon: Daneil Dolin, MD;  [facility];  Service: Endoscopy;  Laterality: N/A;  7:30     Prescriptions Prior to Admission  Medication Sig Dispense Refill Last Dose  . atorvastatin (LIPITOR) 40 MG tablet Take 1 tablet (40 mg total) by mouth daily. (Patient taking  differently: Take 40 mg by mouth at bedtime. ) 30 tablet 6 07/09/2017 at Unknown time  . Coenzyme Q10 (COQ10) 50 MG CAPS Take 1 capsule by mouth daily.   07/09/2017 at Unknown time  . collagenase (SANTYL) ointment Apply topically daily. (Patient taking differently: Apply topically every other day. At night) 15 g 0 Past Week at Unknown time  . ferrous sulfate 325 (65 FE) MG tablet Take 1 tablet (325 mg total) by mouth 3 (three) times daily with meals. 90 tablet 0 07/09/2017 at Unknown time  . glucose blood test strip Use to test blood sugar 3 times daily 100 each 4 07/09/2017 at Unknown time  . insulin aspart (NOVOLOG FLEXPEN) 100 UNIT/ML FlexPen 5 units before each meal (Patient taking differently: Inject 6 Units into the skin 3 (three) times daily with meals. 5 units before each meal) 15 mL 1 07/09/2017 at Unknown time  . metolazone (ZAROXOLYN) 2.5 MG tablet take 1 tablet by mouth  weekly if needed for SWELLING 12 tablet 3 unk  . metoprolol succinate (TOPROL-XL) 25 MG 24 hr tablet Take 0.5 tablets (12.5 mg total) by mouth 2 (two) times daily. 45 tablet 3 07/09/2017 at 1900  . nitroGLYCERIN (NITROSTAT) 0.4 MG SL tablet Place 1 tablet (0.4 mg total) under the tongue every 5 (five) minutes as needed for chest pain. 25 tablet 3 unk  . pantoprazole (PROTONIX) 40 MG tablet Take 1 tablet (40 mg total) by mouth daily. 30 tablet 0 07/09/2017 at Unknown time  . polyethylene glycol (MIRALAX / GLYCOLAX) packet Take 17 g by mouth daily as needed for mild constipation. 14 each 0 unk  . potassium chloride SA (K-DUR,KLOR-CON) 20 MEQ tablet Take 2 tablets (40 mEq total) by mouth daily. (Patient taking differently: Take 20-40 mEq by mouth daily. Only takes 2 tablets when taking metolazone) 60 tablet 6 07/09/2017 at Unknown time  . PROAIR RESPICLICK 295 (90 BASE) MCG/ACT AEPB Inhale 1 puff into the lungs every 6 (six) hours as needed (for breathing).   0 unk  . torsemide (DEMADEX) 20 MG tablet TAKE 4 TABLETS IN THE MORNING AND 2 TABLET IN THE EVENING 180 tablet 6 07/09/2017 at Unknown time  . warfarin (COUMADIN) 2 MG tablet Take 1 tablet daily except 1/2 tablet on Tuesdays and Fridays (Patient taking differently: Take 1 tablet daily except 1/2 tablet on Tuesdays and Thursdays) 30 tablet 3 07/09/2017 at 2030  . Insulin Glargine (LANTUS SOLOSTAR) 100 UNIT/ML Solostar Pen Inject 15 Units into the skin daily at 10 pm. (Patient not taking: Reported on 06/13/2017) 5 pen 3 Not Taking at Unknown time    Inpatient Medications:  . Chlorhexidine Gluconate Cloth  6 each Topical Daily  . colchicine  0.3 mg Oral Daily  . insulin aspart  0-5 Units Subcutaneous QHS  . insulin aspart  0-9 Units Subcutaneous TID WC  . insulin aspart  3 Units Subcutaneous TID WC  . [START ON 07/15/2017] predniSONE  20 mg Oral Q breakfast  . sodium chloride flush  10-40 mL Intracatheter Q12H    Allergies: No Known Allergies  Social  History   Social History  . Marital status: Married    Spouse name: N/A  . Number of children: 1  . Years of education: N/A   Occupational History  .  Unemployed   Social History Main Topics  . Smoking status: Never Smoker  . Smokeless tobacco: Never Used  . Alcohol use No     Comment: Former user 45 years ago  .  Drug use: No  . Sexual activity: No   Other Topics Concern  . Not on file   Social History Narrative   Married 65 years   1 son, 2 grandkids    Denies alcohol, drugs, tobacco      Family History  Problem Relation Age of Onset  . Colon cancer Mother 37  . Coronary artery disease Father   . Colon cancer Sister 62  . Diabetes Sister   . Colon cancer Other        2 cousins, succumbed to illness     Review of Systems: Unable to obtain from this critically ill patient  Physical Exam: Vitals:   07/14/17 2000 07/14/17 2015 07/14/17 2030 07/14/17 2045  BP: 112/76 (!) 102/51 (!) 92/52 (!) 103/53  Pulse: 70 72 69 71  Resp: 15 11 12 14   Temp:      TempSrc:      SpO2: 100% 98% 97% 96%  Weight:      Height:        GEN- The patient is very ill appearing, not very interactive.  Does not open his eyes HEENT: normocephalic, atraumatic; sclera clear, conjunctiva pink; hearing intact; oropharynx clear; neck supple,  R IJ catheter is in place Lungs- decreased BS anteriorly Heart- Regular rate and rhythm (paced) GI- soft, non-tender, non-distended, bowel sounds present Extremities- 2+ depedant edema MS- no significant deformity or atrophy Skin- warm and dry, no rash or lesion Psych- lethargic Neuro- minimally interactive  Labs:   Lab Results  Component Value Date   WBC 24.0 (H) 07/13/2017   HGB 8.4 (L) 07/13/2017   HCT 26.1 (L) 07/13/2017   MCV 90.3 07/13/2017   PLT 187 07/13/2017    Recent Labs Lab 07/13/17 0825  07/14/17 1542  NA  --   < > 131*  K  --   < > 4.0  CL  --   < > 98*  CO2  --   < > 24  BUN  --   < > 37*  CREATININE  --   < > 1.36*    CALCIUM  --   < > 8.3*  PROT 6.3*  --   --   BILITOT 4.8*  --   --   ALKPHOS 143*  --   --   ALT 7*  --   --   AST 40  --   --   GLUCOSE  --   < > 152*  < > = values in this interval not displayed.    Radiology/Studies: Ct Abdomen Pelvis Wo Contrast  Result Date: 07/10/2017 CLINICAL DATA:  Abdominal pain. Swelling. Assess for diverticulitis, hematoma or abscess. History of cirrhosis, diabetes, colostomy and rectal cancer, prostate cancer, anemia. EXAM: CT ABDOMEN AND PELVIS WITHOUT CONTRAST TECHNIQUE: Multidetector CT imaging of the abdomen and pelvis was performed following the standard protocol without IV contrast. COMPARISON:  CT abdomen and pelvis May 05, 2017 FINDINGS: LOWER CHEST: Moderate RIGHT pleural effusion, small LEFT pleural effusion. The heart is mildly enlarged. Severe coronary artery calcifications and/or stents. Cardiac pacemaker wires in place. Bilateral lower lobe atelectasis. Punctate granulomas RIGHT lower lobe. HEPATOBILIARY: Mildly nodular liver contour compatible with history of cirrhosis. Subcentimeter gallstones, contracted gallbladder. PANCREAS: Normal. No intraperitoneal free air. SPLEEN: Normal. ADRENALS/URINARY TRACT: Kidneys are orthotopic, demonstrating normal size and morphology. 3 mm LEFT upper pole, 3 mm RIGHT interpolar nephrolithiasis. Additional punctate nephrolithiasis. No hydronephrosis; limited assessment for renal masses on this nonenhanced examination. The unopacified ureters are normal in  course and caliber. Urinary bladder is decompressed containing a Foley catheter. Normal adrenal glands. STOMACH/BOWEL: Similarly thickened rectum with presacral soft tissue stranding. The stomach, small bowel are normal in course and caliber without inflammatory changes, sensitivity decreased by lack of enteric contrast. LEFT lower quadrant diverting colostomy. Normal appendix. VASCULAR/LYMPHATIC: Aortoiliac vessels are normal in course and caliber. Moderate aortic  atherosclerosis. No lymphadenopathy by CT size criteria. REPRODUCTIVE: Prostate brachia therapy seeds. OTHER: Moderate volume ascites. MUSCULOSKELETAL: Anasarca. Sacroiliac ankylosis. Anterior abdominal wall scarring. Similar sacral sclerosis pre ring post radiation change. IMPRESSION: 1. Cirrhosis.  Moderate ascites.  Anasarca. 2. Diverting colostomy without bowel obstruction. Similar thickened rectum compatible with treatment related changes. 3. Mild cardiomegaly. Moderate RIGHT and small LEFT pleural effusions. Aortic Atherosclerosis (ICD10-I70.0). Electronically Signed   By: Elon Alas M.D.   On: 07/10/2017 19:42   Ct Hip Right Wo Contrast  Result Date: 07/12/2017 CLINICAL DATA:  Extreme right hip pain and bilateral lower extremity swelling without known injury. EXAM: CT OF THE RIGHT HIP WITHOUT CONTRAST TECHNIQUE: Multidetector CT imaging of the right hip was performed according to the standard protocol. Multiplanar CT image reconstructions were also generated. COMPARISON:  CT abdomen and pelvis from 1 day prior. FINDINGS: Bones/Joint/Cartilage Osteopenic appearance of the included pelvis and right hip without fracture or bone destruction. There is lower lumbar facet arthropathy with facet joint space narrowing, sclerosis and hypertrophy. No flattening of the included femoral heads. No joint dislocations. Hypertrophic degenerative changes are seen of both acetabular roofs. Small sclerotic focus of the left acetabular roof is noted, nonspecific possibly a bone island in isolation. No other osseous lesions are apparent. Right-sided gluteal calcific tendinopathy. Ligaments Suboptimally assessed by CT. Muscles and Tendons Generalized muscle atrophy without intramuscular hemorrhage or mass its. Soft tissues There is diffuse anasarca with abdominopelvic ascites. Aortoiliofemoral and branch vessel atherosclerosis without aneurysm. Large intrascrotal hydroceles. Brachytherapy seeds in the prostate.  IMPRESSION: 1. No acute fracture nor dislocation of either hip. No evidence of AVN. Probable right-sided gluteal tendinopathy. 2. The bones are demineralized in appearance. There is generalized muscle atrophy about the right hip. 3. Anasarca with moderate abdominopelvic ascites. 4. Moderate to large bilateral hydroceles. Electronically Signed   By: Langston Royalty M.D.   On: 07/12/2017 03:04   Dg Chest Port 1 View  Result Date: 07/11/2017 CLINICAL DATA:  Short of breath EXAM: PORTABLE CHEST 1 VIEW COMPARISON:  07/10/2017 FINDINGS: Tubular devices are stable. Left subclavian AICD device with leads is stable. Opacity throughout the right lung dx secondary to pleural and parenchymal disease is stable. Left lung is relatively clear would low volumes. No pneumothorax. IMPRESSION: Stable pleural and parenchymal disease in the right lung. Electronically Signed   By: Marybelle Killings M.D.   On: 07/11/2017 07:23   Dg Chest Port 1 View  Result Date: 07/10/2017 CLINICAL DATA:  61 year old male central line placement. EXAM: PORTABLE CHEST 1 VIEW COMPARISON:  07/09/2017 and earlier. FINDINGS: Portable AP semi upright view at 1601 hours. Right IJ approach dual lumen central venous catheter placed, and courses along side the left subclavian approach cardiac AICD leads. Catheter tips are probably at the level of the carina. No pneumothorax. Continued veiling opacity in the right lung. Stable cardiac size and mediastinal contours. Visualized tracheal air column is within normal limits. Stable and satisfactory left lung ventilation. IMPRESSION: 1. Dual-lumen right IJ central catheter tips partially obscured by the AICD leads but at the SVC level. 2. Continued right pleural effusion. No new cardiopulmonary abnormality. Electronically Signed  By: Genevie Ann M.D.   On: 07/10/2017 16:16   Dg Chest Portable 1 View  Result Date: 07/09/2017 CLINICAL DATA:  61 year old male with shortness of breath. EXAM: PORTABLE CHEST 1 VIEW COMPARISON:   Chest radiograph dated 06/13/2017 FINDINGS: There is a small right pleural effusion with right lung base airspace density which may represent atelectasis versus infiltrate. Overall the size of the pleural effusion and right lung base density appears relatively similar to prior radiograph. The left lung is clear. There is no pneumothorax. Stable cardiac silhouette. Left pectoral AICD device. No acute osseous pathology. IMPRESSION: Right pleural effusion and right lung base atelectasis versus infiltrate. Clinical correlation is recommended. Electronically Signed   By: Anner Crete M.D.   On: 07/09/2017 23:11   Dg Hip Unilat With Pelvis 2-3 Views Right  Result Date: 07/10/2017 CLINICAL DATA:  61 year old male with right hip pain. No known injury. EXAM: DG HIP (WITH OR WITHOUT PELVIS) 2-3V RIGHT COMPARISON:  CT of the abdomen pelvis dated 05/05/2016 FINDINGS: There is no acute fracture or dislocation. The bones are mildly osteopenic. There is mild osteoarthritic changes of the hips. Multiple metallic biopsy clips noted in the region of the prostate gland. The soft tissues appear unremarkable with IMPRESSION: No acute fracture or dislocation. Mild arthritic changes. Electronically Signed   By: Anner Crete M.D.   On: 07/10/2017 02:42    EKG: AF with V pacing (personally reviewed)  TELEMETRY: AF with V pacing (personally reviewed)  DEVICE HISTORY: single chamber ICD implanted 2002, upgrade to CRTD 2009, gen change 2012; AVN ablation 2016 (STJ device)  Assessment/Plan: 1.  MSSA bacteremia with CRTD s/p AVN ablation The patient has MSSA bacteremia with ICD system infection.  I agree with Dr Lucianne Lei Dam's clinical assessment that prognosis is very poor.   Palliative care measures would seem most appropriate.  He has been felt too sick for TEE thus far by Dr Haroldine Laws.  Should he improve and become more stable, TEE would be recommended followed by transferral to an extraction center for ICD removal (as Dr  Lovena Le will not be available for extraction at [facility] within the next 10 days).  I would be happy to assist with transfer to [facility] if this is required. Given prior AV nodal ablation, he would require temp-perm pacing while he recovers from bacteremia.  His risks with any extraction procedure is currently prohibitively high. Suppressive antibiotics may be best option at this time.   2.  Acute on chronic systolic heart failure Per AHF team  3.  Permanent AF Chads2vasc score is at least 6.  On heparin drip currently  Very ill gentleman.  A high level of decision making was required for this encounter.  Thompson Grayer MD, [facility] 07/14/2017 8:59 PM

## 2017-07-12 NOTE — Progress Notes (Addendum)
[place] for heparin Indication: atrial fibrillation  No Known Allergies  Patient Measurements: Height: 5\' 10"  (177.8 cm) Weight: 202 lb 13.2 oz (92 kg) IBW/kg (Calculated) : 73  Heparin dosing weight: 90.6 kg  Vital Signs: Temp: 97.5 F (36.4 C) (08/07 0800) Temp Source: Oral (08/07 0800) BP: 107/50 (08/07 0800) Pulse Rate: 73 (08/07 0800)  Labs:  Recent Labs  07/10/17 0200 07/10/17 1115 07/10/17 1800  07/11/17 0335 07/11/17 1324 07/12/17 0424 07/12/17 0426  HGB  --  8.3*  --   --  8.1*  --  7.9*  --   HCT  --  25.2*  --   --  24.4*  --  24.2*  --   PLT  --  175  --   --  193  --  172  --   LABPROT  --  24.1*  --   --  24.9*  --  35.8*  --   INR  --  2.12  --   --  2.21  --  3.49  --   CREATININE  --  3.68*  --   < > 3.69* 3.51*  --  2.74*  TROPONINI 0.11* 1.05* 41.84*  --   --   --   --   --   < > = values in this interval not displayed.  Estimated Creatinine Clearance: 31.9 mL/min (A) (by C-G formula based on SCr of 2.74 mg/dL (H)).  Assessment: 61 year old male with chronic afib on warfarin prior to admission. Patient recently seen in clinic with supratherapeutic INR 3.7 dose reduced to 1mg  of coumadin daily except 2mg  on Tuesday, Thursday, Saturday.  Pt is now requiring CRRT. Planning to hold warfarin and begin heparin.  Orders for heparin received 8/6, INR has trended up this morning to 3.49. Will not start heparin at this time, continue to hold warfarin for time being. Hgb down slightly to 7.9, plt ok, no bleeding noted.  Goal of Therapy:  Heparin level goal 0.3-0.7 INR 2-3 Monitor platelets by anticoagulation protocol: Yes    Plan:  -Begin heparin when INR < 2 -Can likely begin at 1300 units/hr, order not placed yet -Follow daily INR for now  Erin Hearing PharmD., BCPS Clinical Pharmacist Pager [phone] 07/12/2017 8:41 AM

## 2017-07-12 NOTE — Progress Notes (Signed)
      INFECTIOUS DISEASE ATTENDING ADDENDUM:   Date: 07/12/2017  Patient name: [name]  Medical record number: [id number]  Date of birth: 01/04/1956    This patient has been seen and discussed with the house staff. Please see the resident's note for complete details. I concur with their findings with the following additions/corrections:  Saw patient this morning with his wife in the room and had extensive conversation with both of them. He was on levothyroid and on a Bair hugger getting CRRT.  Mr. Coolman is in a very unfortunate predicament. Currently he has severe advanced heart failure requiring inotropes and with a by BiV device He has MSSA bacteremia and by definition CARDIAC DEVICE infection, as well as seeding of HD catheter which he needs for urgent hemodialysis.  We have sent repeat blood cultures but that would not surprise me if there still positive.  We'll continue cefazolin and rifampin for now.  Hopefully he will improve to the point where he can have a "catheter holiday from his hemodialysis catheter.  The bigger question will be whether or not his device can be removed to facilitate cure of his cardiac device infection. Certainly transesophageal echocardiogram can help further elucidate extent of such infection but infection of the device must be presumed.  I'm surprised there is not much being shown on CT of the hip on the right side though the study was done without contrast.  I spent greater than 16minutes with the patient including greater than 50% of time in face to face counsel of the patient and his wife regarding the nature of this MSSA bacteremia cardiac device infection hemodialysis needs cruel care and in coordination of his care.   Rhina Brackett Dam 07/12/2017, 10:21 AM

## 2017-07-12 NOTE — Plan of Care (Signed)
Problem: Physical Regulation: Goal: Will remain free from infection Outcome: Not Progressing MSSA BLOODSTREAM INFECTION.  ON ANTIBIOTICS.  WBC AND PCT TRENDING DOWN.  AFEBRILE.  Problem: Skin Integrity: Goal: Risk for impaired skin integrity will decrease Outcome: Not Progressing SKIN EDEMATOUS AND WEAPING.  Problem: Tissue Perfusion: Goal: Risk factors for ineffective tissue perfusion will decrease Outcome: Progressing COOX WNL   Problem: Activity: Goal: Risk for activity intolerance will decrease Outcome: Progressing PT OT ORDERED  Problem: Fluid Volume: Goal: Ability to maintain a balanced intake and output will improve Outcome: Progressing TOLERATING CVVHD   Problem: Nutrition: Goal: Adequate nutrition will be maintained Outcome: Not Progressing PT HAS POOR APPETITE   Problem: Respiratory: Goal: Respiratory symptoms related to disease process will be avoided Outcome: Progressing PT WEANED TO RA AND NO DYSPNEA NOTED

## 2017-07-12 NOTE — Progress Notes (Signed)
PULMONARY / CRITICAL CARE MEDICINE   Name: [name] MRN: [id number] DOB: Dec 03, 1956    ADMISSION DATE:  07/09/2017 CONSULTATION DATE:  07/10/2017  REFERRING MD:  Sarajane Jews  CHIEF COMPLAINT:  dyspnea  HISTORY OF PRESENT ILLNESS:   61 y/o male with a history of ischecmic cardiomyopathy s/p ICD and CKD was admitted on 8/4 with acute respiratory failure due to pulmonary edema.  Noted on 8/5 to have MSSA bacteremia.  He has been in shock requiring levophed.   SUBJECTIVE:  Remains on levophed and CRRT. Wife very frustrated and confused as she feels pt and her are getting mixed updates (some providers saying poor prognosis while others saying he is improving daily and giving hopes of full recovery etc).   VITAL SIGNS: BP (!) 107/50 (BP Location: Left Arm)   Pulse 73   Temp (!) 97.5 F (36.4 C) (Oral)   Resp 20   Ht 5\' 10"  (1.778 m)   Wt 92 kg (202 lb 13.2 oz)   SpO2 100%   BMI 29.10 kg/m   HEMODYNAMICS: CVP:  [19 mmHg-27 mmHg] 24 mmHg  VENTILATOR SETTINGS:    INTAKE / OUTPUT: I/O last 3 completed shifts: In: 1935.5 [P.O.:360; I.V.:875.5; IV Piggyback:700] Out: 2376 [Urine:1385; Other:991]  PHYSICAL EXAMINATION:  General:  Chronically ill appearing, weak, resting comfortably in bed, wife at bedside. HENT: NCAT OP clear. PULM: Regular effort, CTAB. CV: RRR, 2/6 SEM. GI: BS+, soft, nontender. Derm: edema legs bialterally, RUE fistula with good thrill but no surrounding redness or warmth. Neuro: awake, alert, no distress, MAE.   LABS:  BMET  Recent Labs Lab 07/11/17 0335 07/11/17 1324 07/12/17 0426  NA 124* 121* 126*  K 3.7 3.6 3.5  CL 90* 90* 90*  CO2 19* 17* 21*  BUN 109* 109* 90*  CREATININE 3.69* 3.51* 2.74*  GLUCOSE 263* 309* 252*    Electrolytes  Recent Labs Lab 07/09/17 2132 07/10/17 0200  07/11/17 0335 07/11/17 1324 07/12/17 0426  CALCIUM 8.7*  --   < > 8.6* 8.4* 8.2*  MG 2.3  --   --  2.2  --  2.5*  PHOS  --  4.6  --   --   --  4.1   < > = values in this interval not displayed.  CBC  Recent Labs Lab 07/10/17 1115 07/11/17 0335 07/12/17 0424  WBC 28.0* 31.2* 29.9*  HGB 8.3* 8.1* 7.9*  HCT 25.2* 24.4* 24.2*  PLT 175 193 172    Coag's  Recent Labs Lab 07/10/17 1115 07/11/17 0335 07/12/17 0424  INR 2.12 2.21 3.49    Sepsis Markers  Recent Labs Lab 07/10/17 0427 07/10/17 1115 07/11/17 0335 07/12/17 0426  LATICACIDVEN 4.7* 3.9* 3.2*  --   PROCALCITON  --  3.96 3.97 2.78    ABG  Recent Labs Lab 07/11/17 0355  PHART 7.470*  PCO2ART 24.9*  PO2ART 60.0*    Liver Enzymes  Recent Labs Lab 07/09/17 2132 07/10/17 1115 07/12/17 0426  AST 45* 47*  --   ALT 14* 16*  --   ALKPHOS 98 96  --   BILITOT 2.5* 2.8*  --   ALBUMIN 2.2* 2.1* 1.9*    Cardiac Enzymes  Recent Labs Lab 07/10/17 0200 07/10/17 1115 07/10/17 1800  TROPONINI 0.11* 1.05* 41.84*    Glucose  Recent Labs Lab 07/10/17 2153 07/11/17 0818 07/11/17 1226 07/11/17 1602 07/11/17 2107 07/12/17 0734  GLUCAP 264* 292* 300* 296* 292* 328*    Imaging Ct Hip Right Wo Contrast  Result  Date: 07/12/2017 CLINICAL DATA:  Extreme right hip pain and bilateral lower extremity swelling without known injury. EXAM: CT OF THE RIGHT HIP WITHOUT CONTRAST TECHNIQUE: Multidetector CT imaging of the right hip was performed according to the standard protocol. Multiplanar CT image reconstructions were also generated. COMPARISON:  CT abdomen and pelvis from 1 day prior. FINDINGS: Bones/Joint/Cartilage Osteopenic appearance of the included pelvis and right hip without fracture or bone destruction. There is lower lumbar facet arthropathy with facet joint space narrowing, sclerosis and hypertrophy. No flattening of the included femoral heads. No joint dislocations. Hypertrophic degenerative changes are seen of both acetabular roofs. Small sclerotic focus of the left acetabular roof is noted, nonspecific possibly a bone island in isolation. No other  osseous lesions are apparent. Right-sided gluteal calcific tendinopathy. Ligaments Suboptimally assessed by CT. Muscles and Tendons Generalized muscle atrophy without intramuscular hemorrhage or mass its. Soft tissues There is diffuse anasarca with abdominopelvic ascites. Aortoiliofemoral and branch vessel atherosclerosis without aneurysm. Large intrascrotal hydroceles. Brachytherapy seeds in the prostate. IMPRESSION: 1. No acute fracture nor dislocation of either hip. No evidence of AVN. Probable right-sided gluteal tendinopathy. 2. The bones are demineralized in appearance. There is generalized muscle atrophy about the right hip. 3. Anasarca with moderate abdominopelvic ascites. 4. Moderate to large bilateral hydroceles. Electronically Signed   By: Eltzroth Royalty M.D.   On: 07/12/2017 03:04     STUDIES:  Echo 8/5 > LVEF 15-20%, severe TR, PA press 80's, no veg seen on valves Hip CT 8/7 > no acute fx or dislocation. No evidence of ATN.  Probable right sided gluteal tendinopathy. TEE 8/9 >   CULTURES: Blood 8/4 > staph Blood 8/6 >  ANTIBIOTICS: 8/6 cefazolin >  8/6 rifampin >  8/5 vanc > 8/5 8/5 zosyn > 8/5  SIGNIFICANT EVENTS: 08/04 > admit 08/06 > CVVH started 8/7 > EP consulted  LINES/TUBES: 8/5 RIJ HD cath >   DISCUSSION: 61 y/o male with a past medical history significant for CAD, ischemic cardiomyopathy admitted with MSSA bacteremia and demand ischemia associated shock. Now with acute on chronic renal failure.    ASSESSMENT / PLAN:  PULMONARY A: Acute dyspnea/mild hypoxemia due to acute pulmonary edema - improved 8/7 OSA P:   Continue volume removal per CVVHD Contiue O2 to maintain O2 saturation > 90% Nocturnal CPAP  CARDIOVASCULAR A:  Ischemic cardiomyopathy Shock: mixed picture septic and cardiogenic Elevated PA pressure, likely due to volume overload ICD in place - now complicated by MSSA bacteremia Demand ischemia, troponin 40 Paroxysmal afib Known CAD P:   Tele Will need TEE Heparin per pharmacy once INR < 2 (in lieu of preadmission warfarin) Continue ASA EP consulted 8/7 for recs regarding ICD as ultimately he will need removal of device due to MSSA bacteremia  RENAL A:   Acute on chronic renal failure R arm fistula in place Hyponatremia - hypervolemic P:   Monitor BMET and UOP Replace electrolytes as needed Continue CVVHD per nephrology  GASTROINTESTINAL A:   History of colostomy/rectal cancer P:   Heart healthy diet for now NPO after midnight once TEE planned (likely 8/9)  HEMATOLOGIC A:   Anemia without bleeding P:  Monitor for bleeding Heparin per pharmacy once INR < 2  INFECTIOUS A:   MSSA bacteremia with ICD in place, HD cath in place (placed 8/5) P:   Antibiotics per ID Will need TEE Will need line and ICD holiday Repeat blood cultures per ID  ENDOCRINE A:   Hyperglycemia P:   SSI  Add AC / HS SSI coverage  NEUROLOGIC A:   No acute issues P:   Monitor neuro status closely Fentanyl PRN pain  MUSCULOSKELETAL A: Right gluteal tendinopathy - per hip CT P: PT once off CRRT   FAMILY  - Updates: wife updated bedside 8/7.  She is very frustrated and confused as she feels pt and her are getting mixed updates (some providers saying poor prognosis while others saying he is improving daily and giving hopes of full recovery etc).   CC time: 35 min.   Montey Hora, [facility] Pager: [phone]  or [phone] 07/12/2017, 9:51 AM

## 2017-07-12 NOTE — Plan of Care (Signed)
Problem: Fluid Volume: Goal: Fluid volume balance will be maintained or improved Outcome: Progressing Currently tolerating fluid pull on CRRT

## 2017-07-13 DIAGNOSIS — T827XXD Infection and inflammatory reaction due to other cardiac and vascular devices, implants and grafts, subsequent encounter: Secondary | ICD-10-CM

## 2017-07-13 DIAGNOSIS — J9601 Acute respiratory failure with hypoxia: Secondary | ICD-10-CM

## 2017-07-13 LAB — GLUCOSE, CAPILLARY
GLUCOSE-CAPILLARY: 213 mg/dL — AB (ref 65–99)
GLUCOSE-CAPILLARY: 206 mg/dL — AB (ref 65–99)
GLUCOSE-CAPILLARY: 219 mg/dL — AB (ref 65–99)
Glucose-Capillary: 186 mg/dL — ABNORMAL HIGH (ref 65–99)

## 2017-07-13 LAB — RENAL FUNCTION PANEL
ALBUMIN: 1.8 g/dL — AB (ref 3.5–5.0)
Albumin: 1.9 g/dL — ABNORMAL LOW (ref 3.5–5.0)
Anion gap: 11 (ref 5–15)
Anion gap: 12 (ref 5–15)
BUN: 56 mg/dL — AB (ref 6–20)
BUN: 47 mg/dL — AB (ref 6–20)
CALCIUM: 8.4 mg/dL — AB (ref 8.9–10.3)
CHLORIDE: 94 mmol/L — AB (ref 101–111)
CO2: 23 mmol/L (ref 22–32)
CO2: 23 mmol/L (ref 22–32)
CREATININE: 1.9 mg/dL — AB (ref 0.61–1.24)
CREATININE: 1.57 mg/dL — AB (ref 0.61–1.24)
Calcium: 8.4 mg/dL — ABNORMAL LOW (ref 8.9–10.3)
Chloride: 94 mmol/L — ABNORMAL LOW (ref 101–111)
GFR calc Af Amer: 53 mL/min — ABNORMAL LOW (ref >60)
GFR, EST AFRICAN AMERICAN: 42 mL/min — AB (ref >60)
GFR, EST NON AFRICAN AMERICAN: 36 mL/min — AB (ref >60)
GFR, EST NON AFRICAN AMERICAN: 46 mL/min — AB (ref >60)
GLUCOSE: 198 mg/dL — AB (ref 65–99)
Glucose, Bld: 185 mg/dL — ABNORMAL HIGH (ref 65–99)
PHOSPHORUS: 2.8 mg/dL (ref 2.5–4.6)
POTASSIUM: 4.1 mmol/L (ref 3.5–5.1)
Phosphorus: 2.8 mg/dL (ref 2.5–4.6)
Potassium: 4.2 mmol/L (ref 3.5–5.1)
SODIUM: 129 mmol/L — AB (ref 135–145)
Sodium: 128 mmol/L — ABNORMAL LOW (ref 135–145)

## 2017-07-13 LAB — HEPATIC FUNCTION PANEL
ALK PHOS: 143 U/L — AB (ref 38–126)
ALT: 7 U/L — ABNORMAL LOW (ref 17–63)
AST: 40 U/L (ref 15–41)
Albumin: 1.9 g/dL — ABNORMAL LOW (ref 3.5–5.0)
BILIRUBIN DIRECT: 3 mg/dL — AB (ref 0.1–0.5)
BILIRUBIN INDIRECT: 1.8 mg/dL — AB (ref 0.3–0.9)
BILIRUBIN TOTAL: 4.8 mg/dL — AB (ref 0.3–1.2)
Total Protein: 6.3 g/dL — ABNORMAL LOW (ref 6.5–8.1)

## 2017-07-13 LAB — PTH, INTACT AND CALCIUM
CALCIUM TOTAL (PTH): 7.9 mg/dL — AB (ref 8.6–10.2)
PTH: 58 pg/mL (ref 15–65)

## 2017-07-13 LAB — CBC
HEMATOCRIT: 26.1 % — AB (ref 39.0–52.0)
HEMOGLOBIN: 8.4 g/dL — AB (ref 13.0–17.0)
MCH: 29.1 pg (ref 26.0–34.0)
MCHC: 32.2 g/dL (ref 30.0–36.0)
MCV: 90.3 fL (ref 78.0–100.0)
Platelets: 187 10*3/uL (ref 150–400)
RBC: 2.89 MIL/uL — ABNORMAL LOW (ref 4.22–5.81)
RDW: 15.9 % — ABNORMAL HIGH (ref 11.5–15.5)
WBC: 24 10*3/uL — AB (ref 4.0–10.5)

## 2017-07-13 LAB — PROTIME-INR
INR: 5.42 — AB
PROTHROMBIN TIME: 51 s — AB (ref 11.4–15.2)

## 2017-07-13 LAB — COOXEMETRY PANEL
CARBOXYHEMOGLOBIN: 1.6 % — AB (ref 0.5–1.5)
Methemoglobin: 1.3 % (ref 0.0–1.5)
O2 SAT: 57.5 %
TOTAL HEMOGLOBIN: 10.2 g/dL — AB (ref 12.0–16.0)

## 2017-07-13 LAB — MAGNESIUM: MAGNESIUM: 2.4 mg/dL (ref 1.7–2.4)

## 2017-07-13 NOTE — Progress Notes (Signed)
Pharmacy Antibiotic Note  [name] is a 61 y.o. male admitted on 07/09/2017 with sepsis. Pharmacy consulted to dose cefazolin for MSSA bacteremia. Patient was on rifampin with Cefazolin, but the rifampin was discontinued today (8/8) due to hyperbilirubinemia and elevated INR. Pt is afebrile and WBC remains elevated at 24. TEE planned for 8/9. Patient on CRRT since 8/6.   Plan: Continue Cefazolin 2gm IV Q12h Follow-up on further antibiotic plans Monitor renal function and clinical status  Temp (24hrs), Avg:97.8 F (36.6 C), Min:97.6 F (36.4 C), Max:97.9 F (36.6 C)   Recent Labs Lab 07/09/17 2132  07/10/17 0132 07/10/17 0213 07/10/17 0427 07/10/17 1115  07/11/17 0335 07/11/17 1324 07/12/17 0424 07/12/17 0426 07/12/17 1110 07/12/17 1600 07/13/17 0337 07/13/17 0341  WBC 18.1*  --   --   --   --  28.0*  --  31.2*  --  29.9*  --   --   --  24.0*  --   CREATININE 3.75*  < >  --   --   --  3.68*  < > 3.69* 3.51*  --  2.74* 2.47* 2.10*  --  1.90*  LATICACIDVEN  --   < > 5.4* 5.31* 4.7* 3.9*  --  3.2*  --   --   --   --   --   --   --   < > = values in this interval not displayed.  Estimated Creatinine Clearance: 45.3 mL/min (A) (by C-G formula based on SCr of 1.9 mg/dL (H)).    No Known Allergies   Leroy Libman, PharmD Pharmacy Resident Pager: [phone] 07/13/2017 1:37 PM

## 2017-07-13 NOTE — Progress Notes (Signed)
Patient ID: [name], male   DOB: 07-27-1956, 61 y.o.   MRN: [id number]     Advanced Heart Failure Rounding Note  Primary Cardiologist: [name]  Subjective:    Admitted with fatigue, dyspnea, recurrent lower extremity swelling and decreased UOP.   Remains on CRRT. Norepi increased to 11 mcg. Todays CO-OX is 57.5%.  Complains of fatigue. Very weak. Appetite poor.  Remains on CVVHD weight down 6 pounds.   Blood CX- 2/2 MSSA ? From LLE wounds?Marland Kitchen On cefazolin and rifampin  WBC down to 24.   Objective:   Weight Range: 196 lb 6.9 oz (89.1 kg) Body mass index is 28.18 kg/m.   Vital Signs:   Temp:  [97.6 F (36.4 C)-97.9 F (36.6 C)] 97.8 F (36.6 C) (08/08 0715) Pulse Rate:  [69-97] 76 (08/08 0800) Resp:  [11-24] 15 (08/08 0800) BP: (89-121)/(45-100) 110/45 (08/08 0800) SpO2:  [91 %-100 %] 95 % (08/08 0800) Weight:  [196 lb 6.9 oz (89.1 kg)] 196 lb 6.9 oz (89.1 kg) (08/08 0500) Last BM Date: 07/13/17 (ostomy moved, bag changed)  Weight change: Filed Weights   07/12/17 0345 07/12/17 0615 07/13/17 0500  Weight: 202 lb 6.1 oz (91.8 kg) 202 lb 13.2 oz (92 kg) 196 lb 6.9 oz (89.1 kg)    Intake/Output:   Intake/Output Summary (Last 24 hours) at 07/13/17 0838 Last data filed at 07/13/17 0800  Gross per 24 hour  Intake          2042.62 ml  Output             4764 ml  Net         -2721.38 ml      Physical Exam  CVP 22 General:  Chronically ill. No resp difficulty. In bed  HEENT: normal excpt for scleral icterus.  Neck: supple. RIJ trialysis cath . Carotids 2+ bilat; no bruits. No lymphadenopathy or thryomegaly appreciated. RIJ HD  Cor: PMI laterally displaced. Regular rate & rhythm. 2/6 SEM LUSB Abdomen: soft, nontender, + distended. No hepatosplenomegaly. No bruits or masses. Good bowel sounds. LLQ colostomy site Extremities: no cyanosis, clubbing, rash, 2-3+ edema Neuro: alert & orientedx3, cranial nerves grossly intact. moves all 4 extremities w/o difficulty.  Affect pleasant   Telemetry  A fib with v pacing 70s. Personally reviewed.   Labs    CBC  Recent Labs  07/10/17 1115  07/12/17 0424 07/13/17 0337  WBC 28.0*  < > 29.9* 24.0*  NEUTROABS 26.0*  --  28.1*  --   HGB 8.3*  < > 7.9* 8.4*  HCT 25.2*  < > 24.2* 26.1*  MCV 92.6  < > 89.3 90.3  PLT 175  < > 172 187  < > = values in this interval not displayed. Basic Metabolic Panel  Recent Labs  07/12/17 0426  07/12/17 1600 07/13/17 0337 07/13/17 0341  NA 126*  < > 128*  --  128*  K 3.5  < > 3.8  --  4.1  CL 90*  < > 94*  --  94*  CO2 21*  < > 21*  --  23  GLUCOSE 252*  < > 206*  --  185*  BUN 90*  < > 70*  --  56*  CREATININE 2.74*  < > 2.10*  --  1.90*  CALCIUM 8.2*  < > 8.2*  --  8.4*  MG 2.5*  --   --  2.4  --   PHOS 4.1  --  3.2  --  2.8  < > =  values in this interval not displayed. Liver Function Tests  Recent Labs  07/10/17 1115  07/12/17 1110 07/12/17 1600 07/13/17 0341  AST 47*  --  34  --   --   ALT 16*  --  8*  --   --   ALKPHOS 96  --  126  --   --   BILITOT 2.8*  --  4.7*  --   --   PROT 6.4*  --  6.2*  --   --   ALBUMIN 2.1*  < > 2.0* 1.9* 1.9*  < > = values in this interval not displayed. No results for input(s): LIPASE, AMYLASE in the last 72 hours. Cardiac Enzymes  Recent Labs  07/10/17 1115 07/10/17 1800  TROPONINI 1.05* 41.84*    BNP: BNP (last 3 results)  Recent Labs  06/13/17 1205 07/09/17 2151  BNP 1,151.0* 1,553.9*    ProBNP (last 3 results) No results for input(s): PROBNP in the last 8760 hours.   D-Dimer No results for input(s): DDIMER in the last 72 hours. Hemoglobin A1C No results for input(s): HGBA1C in the last 72 hours. Fasting Lipid Panel No results for input(s): CHOL, HDL, LDLCALC, TRIG, CHOLHDL, LDLDIRECT in the last 72 hours. Thyroid Function Tests No results for input(s): TSH, T4TOTAL, T3FREE, THYROIDAB in the last 72 hours.  Invalid input(s): FREET3  Other results:   Imaging    No results  found.   Medications:     Scheduled Medications: . aspirin  325 mg Oral Daily  . atorvastatin  40 mg Oral q1800  . Chlorhexidine Gluconate Cloth  6 each Topical Daily  . colchicine  0.3 mg Oral Daily  . insulin aspart  0-5 Units Subcutaneous QHS  . insulin aspart  0-9 Units Subcutaneous TID WC  . insulin aspart  3 Units Subcutaneous TID WC  . rifampin  300 mg Oral Daily  . sodium chloride flush  10-40 mL Intracatheter Q12H    Infusions: . sodium chloride Stopped (07/13/17 0800)  .  ceFAZolin (ANCEF) IV Stopped (07/13/17 0714)  . heparin    . norepinephrine (LEVOPHED) Adult infusion 11 mcg/min (07/13/17 0800)  . dialysis replacement fluid (prismasate) 300 mL/hr at 07/13/17 0759  . dialysis replacement fluid (prismasate) 200 mL/hr at 07/12/17 2335  . dialysate (PRISMASATE) 1,000 mL/hr at 07/13/17 0420    PRN Medications: sodium chloride, acetaminophen, alum & mag hydroxide-simeth, fentaNYL (SUBLIMAZE) injection, heparin, heparin, heparin, HYDROcodone-acetaminophen, magic mouthwash w/lidocaine, ondansetron (ZOFRAN) IV, sodium chloride flush    Patient Profile   [name] is a 61 year old with a history of DMII, SAH 2016, rectal cancer, CKD Stage IV, chronic A fib, S/P AVN ablation, PAF, CAD BMS LAD 2011 BMS LAD 2009, OSA, chronic systolic heart failure, [employer] Jude BiV ICD admitted with AKI and shock with elevated lactate, suspect cardiogenic.   Assessment/Plan   1. Acute on chronic systolic CHF/cardiogenic shock: Last echo in 8/17 with EF 15-20%.  Ischemic cardiomyopathy, has [employer] Jude BiV ICD (despite chronic afib appears to be effectively BiV pacing).  Had recent admission (discharged 06/21/17) requiring milrinone support and diuresis.  Admitted with soft BP (SBP 80s-90s) + elevated lactate.  Some concern initially for PNA/septic shock, but suspect CXR shows R effusion rather than PNA, no fever.   Started on CRRT-.Norpei up 11 mcg.   CO-OX 58%.  2. AKI on CKD stage 4: Baseline  creatinine around 2.5.  He required brief HD in the past, has RUE fistula.Started CRRT 8/6. Nephrology appreciated.  3. Atrial fibrillation: Chronic.  On warfarin, INR  5.4  He has been off coumadin for 2 days. 4. CAD: No CP. He can continue home statin.  5. ID: WBC trending down 29>24.  Covering with vanc/Zosyn for PNA given initial concern for septic shock, but suspect R-sided pleural effusion and cardiogenic shock. Blood CX 2/2 MSSA. Antibiotics narrowed rifampin and ancef. Has BiV.  Eventually will need TEE 6. H/O Rectal CA with diverting colostomy  7. Type II diabetes: Sliding scale.  8. Liver failure - Bilirubin climbing. Have discussed with PharmD about possible need to decrease rirfam  Length of Stay: 3  Amy Clegg, NP  07/13/2017, 8:38 AM  Advanced Heart Failure Team Pager [phone] (M-F; 7a - 4p)  Please contact [facility] for night-coverage after hours (4p -7a ) and weekends on [email or web address]  Agree with above.  He remains critically ill with a combination of cardiogenic and septic shock now with MSOF. Remains volume overloaded but improving with CVVHD. Still requiring norepinephrine at 11 to maintain BP. Bilirubin up to about 5. Discussed with PharmD about hepatic dosing for meds.   On exam  Very weak appearing. In bed. Jaundiced RIJ trialysis cath Cor RRR + s3 Lungs + crackles Ab distended Ext cool  2+ edema  Continues to struggle with MSOF in setting of combined cardiogenic and septic shock. Remains on norepinephine. I am increasingly concerned about his lack of progress. Will continue current support for now. PT to see today. Will hold off on TEE for now until more stable and volume status improved.  D/w Dr. Kendrick Fries (CCM).  CRITICAL CARE Performed by: Arvilla Meres  Total critical care time: 35 minutes  Critical care time was exclusive of separately billable procedures and treating other patients.  Critical care was necessary to treat or prevent imminent or  life-threatening deterioration.  Critical care was time spent personally by me (independent of midlevel providers or residents) on the following activities: development of treatment plan with patient and/or surrogate as well as nursing, discussions with consultants, evaluation of patient's response to treatment, examination of patient, obtaining history from patient or surrogate, ordering and performing treatments and interventions, ordering and review of laboratory studies, ordering and review of radiographic studies, pulse oximetry and re-evaluation of patient's condition.  Arvilla Meres, MD  12:06 PM

## 2017-07-13 NOTE — Progress Notes (Signed)
Patient stated that he doesn't wear CPAP at home and doesn't want to wear.  Advised if he changes mind to have RN notify respiratory.

## 2017-07-13 NOTE — Progress Notes (Signed)
[facility]  Date of Admission:  07/09/2017   Total days of antibiotics 4        Day 4 Cefazolin        Day 4 Rifampin (stop 8/8)         ASSESSMENT and PLAN:  MSSA Bacteremia  -Blood culture and biofire both resulted for MSSA -TEE later this week   -The patient has a AICD that is likely to be infected in the setting of MSSA bacteremia however is unlikely to survive removal.  -Consider speaking to palliative care  -Continue cefazolin  -Discontinue po rifampin due to possible hepatotoxicity (ALP=143, Tbili=4.8, Dbili=3.0, Ibili=1.8, total protein=6.3) -Provide catheter holiday whenever possible -Please wait for no growth for 4 days prior to placing any lines. Currently no growth for 2 days. -skin breakdown and cellulitis are likely source of infection  Right Hip Pain Ct Hip w/o contrast did not show any presence of infection. CT was without contrast it cannot be ruled out. If high suspicion still exists for infection needle arthroscopy can be tried.  -Possible gout flare, continue colchicine. NSAIDS not recommended due to renal failure. Steroids c/i unless absolutely necessary due to bacteremia.   Marland Kitchen aspirin  325 mg Oral Daily  . atorvastatin  40 mg Oral q1800  . Chlorhexidine Gluconate Cloth  6 each Topical Daily  . colchicine  0.3 mg Oral Daily  . insulin aspart  0-5 Units Subcutaneous QHS  . insulin aspart  0-9 Units Subcutaneous TID WC  . insulin aspart  3 Units Subcutaneous TID WC  . sodium chloride flush  10-40 mL Intracatheter Q12H    SUBJECTIVE: [name] was seen laying in his bed this morning. He denied any sob or chest pain. He did however mention that he continues to have pain in his right hip and thigh that is 9/10 intensity. He has had occasional headaches as well.  Review of Systems: ROS none except above   No Known Allergies  OBJECTIVE: Vitals:   07/13/17 1130 07/13/17 1200 07/13/17 1230 07/13/17 1300  BP: (!) 113/51  (!) 115/55 (!) 110/52 (!) 106/48  Pulse: 73 77 73 73  Resp: 14 15 16 12   Temp:      TempSrc:      SpO2: 96% 97% 98% 96%  Weight:      Height:       Body mass index is 28.18 kg/m.  Physical Exam  Constitutional: He appears lethargic. He appears not jaundiced. He appears not cachectic. He has a sickly appearance.  HENT:  Head: Normocephalic and atraumatic.  Cardiovascular: Normal rate, regular rhythm, normal heart sounds and intact distal pulses.   Pulmonary/Chest: Breath sounds normal. No respiratory distress. He has no wheezes.  Auscultation in supine position  Abdominal: Soft. He exhibits distension. There is no tenderness.  Musculoskeletal: He exhibits tenderness (right hip).  Neurological: He appears lethargic.  Skin: Petechiae and rash noted. There is erythema.    Lab Results Lab Results  Component Value Date   WBC 24.0 (H) 07/13/2017   HGB 8.4 (L) 07/13/2017   HCT 26.1 (L) 07/13/2017   MCV 90.3 07/13/2017   PLT 187 07/13/2017    Lab Results  Component Value Date   CREATININE 1.90 (H) 07/13/2017   BUN 56 (H) 07/13/2017   NA 128 (L) 07/13/2017   K 4.1 07/13/2017   CL 94 (L) 07/13/2017   CO2 23 07/13/2017    Lab Results  Component  Value Date   ALT 7 (L) 07/13/2017   AST 40 07/13/2017   ALKPHOS 143 (H) 07/13/2017   BILITOT 4.8 (H) 07/13/2017     Microbiology: Recent Results (from the past 240 hour(s))  Blood culture (routine x 2)     Status: Abnormal   Collection Time: 07/09/17 10:04 PM  Result Value Ref Range Status   Specimen Description BLOOD LEFT HAND  Final   Special Requests IN PEDIATRIC BOTTLE Blood Culture adequate volume  Final   Culture  Setup Time   Final    GRAM POSITIVE COCCI IN CLUSTERS IN PEDIATRIC BOTTLE CRITICAL RESULT CALLED TO, READ BACK BY AND VERIFIED WITH: R RUMBARGER,PHARMD AT 1613 07/10/17 BY L BENFIELD    Culture (A)  Final    STAPHYLOCOCCUS AUREUS SUSCEPTIBILITIES PERFORMED ON PREVIOUS CULTURE WITHIN THE LAST 5 DAYS.     Report Status 07/12/2017 FINAL  Final  Blood Culture ID Panel (Reflexed)     Status: Abnormal   Collection Time: 07/09/17 10:04 PM  Result Value Ref Range Status   Enterococcus species NOT DETECTED NOT DETECTED Final   Listeria monocytogenes NOT DETECTED NOT DETECTED Final   Staphylococcus species DETECTED (A) NOT DETECTED Final    Comment: CRITICAL RESULT CALLED TO, READ BACK BY AND VERIFIED WITH: R RUMBARGER,PHARMD AT 1613 07/10/17 BY L BENFIELD    Staphylococcus aureus DETECTED (A) NOT DETECTED Final    Comment: Methicillin (oxacillin) susceptible Staphylococcus aureus (MSSA). Preferred therapy is anti staphylococcal beta lactam antibiotic (Cefazolin or Nafcillin), unless clinically contraindicated. CRITICAL RESULT CALLED TO, READ BACK BY AND VERIFIED WITH: R RUMBARGER,PHARMD AT 1613 07/10/17 BY L BENFIELD    Methicillin resistance NOT DETECTED NOT DETECTED Final   Streptococcus species NOT DETECTED NOT DETECTED Final   Streptococcus agalactiae NOT DETECTED NOT DETECTED Final   Streptococcus pneumoniae NOT DETECTED NOT DETECTED Final   Streptococcus pyogenes NOT DETECTED NOT DETECTED Final   Acinetobacter baumannii NOT DETECTED NOT DETECTED Final   Enterobacteriaceae species NOT DETECTED NOT DETECTED Final   Enterobacter cloacae complex NOT DETECTED NOT DETECTED Final   Escherichia coli NOT DETECTED NOT DETECTED Final   Klebsiella oxytoca NOT DETECTED NOT DETECTED Final   Klebsiella pneumoniae NOT DETECTED NOT DETECTED Final   Proteus species NOT DETECTED NOT DETECTED Final   Serratia marcescens NOT DETECTED NOT DETECTED Final   Haemophilus influenzae NOT DETECTED NOT DETECTED Final   Neisseria meningitidis NOT DETECTED NOT DETECTED Final   Pseudomonas aeruginosa NOT DETECTED NOT DETECTED Final   Candida albicans NOT DETECTED NOT DETECTED Final   Candida glabrata NOT DETECTED NOT DETECTED Final   Candida krusei NOT DETECTED NOT DETECTED Final   Candida parapsilosis NOT DETECTED NOT  DETECTED Final   Candida tropicalis NOT DETECTED NOT DETECTED Final  Blood culture (routine x 2)     Status: Abnormal   Collection Time: 07/09/17 10:08 PM  Result Value Ref Range Status   Specimen Description BLOOD LEFT FOREARM  Final   Special Requests   Final    BOTTLES DRAWN AEROBIC AND ANAEROBIC Blood Culture adequate volume   Culture  Setup Time   Final    GRAM POSITIVE COCCI IN CLUSTERS IN BOTH AEROBIC AND ANAEROBIC BOTTLES CRITICAL RESULT CALLED TO, READ BACK BY AND VERIFIED WITH: R RUMBARGER,PHARMD AT 1613 07/10/17 BY L BENFIELD    Culture STAPHYLOCOCCUS AUREUS (A)  Final   Report Status 07/12/2017 FINAL  Final   Organism ID, Bacteria STAPHYLOCOCCUS AUREUS  Final      Susceptibility  Staphylococcus aureus - MIC*    CIPROFLOXACIN <=0.5 SENSITIVE Sensitive     ERYTHROMYCIN <=0.25 SENSITIVE Sensitive     GENTAMICIN <=0.5 SENSITIVE Sensitive     OXACILLIN 0.5 SENSITIVE Sensitive     TETRACYCLINE <=1 SENSITIVE Sensitive     VANCOMYCIN <=0.5 SENSITIVE Sensitive     TRIMETH/SULFA <=10 SENSITIVE Sensitive     CLINDAMYCIN <=0.25 SENSITIVE Sensitive     RIFAMPIN <=0.5 SENSITIVE Sensitive     Inducible Clindamycin NEGATIVE Sensitive     * STAPHYLOCOCCUS AUREUS  Urine culture     Status: None   Collection Time: 07/09/17 10:15 PM  Result Value Ref Range Status   Specimen Description URINE, CATHETERIZED  Final   Special Requests NONE  Final   Culture NO GROWTH  Final   Report Status 07/11/2017 FINAL  Final  MRSA PCR Screening     Status: None   Collection Time: 07/10/17  2:48 AM  Result Value Ref Range Status   MRSA by PCR NEGATIVE NEGATIVE Final    Comment:        The GeneXpert MRSA Assay (FDA approved for NASAL specimens only), is one component of a comprehensive MRSA colonization surveillance program. It is not intended to diagnose MRSA infection nor to guide or monitor treatment for MRSA infections.   Culture, blood (Routine X 2) w Reflex to ID Panel     Status: None  (Preliminary result)   Collection Time: 07/11/17  4:22 PM  Result Value Ref Range Status   Specimen Description BLOOD BLOOD LEFT HAND  Final   Special Requests   Final    BOTTLES DRAWN AEROBIC ONLY Blood Culture adequate volume   Culture NO GROWTH 2 DAYS  Final   Report Status PENDING  Incomplete  Culture, blood (Routine X 2) w Reflex to ID Panel     Status: None (Preliminary result)   Collection Time: 07/11/17  4:25 PM  Result Value Ref Range Status   Specimen Description BLOOD BLOOD LEFT HAND  Final   Special Requests   Final    BOTTLES DRAWN AEROBIC ONLY Blood Culture adequate volume   Culture NO GROWTH 2 DAYS  Final   Report Status PENDING  Incomplete    Lars Mage, MD [facility] [phone] pager   [phone] cell 07/13/2017, 1:21 PM

## 2017-07-13 NOTE — Progress Notes (Signed)
PT refused CPAP.  

## 2017-07-13 NOTE — Progress Notes (Signed)
Kienan C Lindy Bethany Molt, DO Length of Stay: 3  SUBJECTIVE: Brief HPI: 61 y/o M w/ sCHF s/p AICD placement, advanced CKD now likely ESRD with RUE AVF placement, cirrhosis and history of rectal cancer s/p resection and colostomy placement admitted 07/09/17 with respiratory failure, demand ischemia and volume overload. Presented in mixed cardiogenic shock (MSSA bacteremia). UOP trailed off despite lasix drip and was started on CRRT 8/6. Recently admitted 7/9-7/17 for volume overload and was diuresed with IV lasix, metolazone and milrinone until day of discharge when he was sent home with home dose of torsemide. Developed SOB, worsening edema and has gained nearly 30 lbs since discharge. He sees Dr. Hinda Lenis as an outpatient and required HD last year from July until November 2017 for AKI in CKD 2/2 ischemic ATN.   Interval events:  Net out 2.7 L yesterday, pulling 100-167ms/hr on CRRT and having a little UOP via catheter. Remains on levophed, now on 10.3 mcgs/min. Weight 196 lbs.  Yesterdays hepatic function revealed T-bili of 4.7, up from 2.8 on admission. Also, INR returned as 5.4, up from 3.49 the day prior. Has not had coumadin since 8/5.   OBJECTIVE  Intake/Output Summary (Last 24 hours) at 07/13/17 0811 Last data filed at 07/13/17 0800  Gross per 24 hour  Intake          2042.62 ml  Output             4764 ml  Net         -2721.38 ml   Filed Weights   07/12/17 0345 07/12/17 0615 07/13/17 0500  Weight: 202 lb 6.1 oz (91.8 kg) 202 lb 13.2 oz (92 kg) 196 lb 6.9 oz (89.1 kg)   Physical Exam   Vitals: Blood pressure (!) 110/45, pulse 76, temperature 97.8 F (36.6 C), temperature source Oral, resp. rate 15, height 5' 10" (1.778 m), weight 196 lb 6.9 oz (89.1 kg), SpO2 95 %. GEN: Chronically-ill appearing caucasian male resting in bed. Wife present. Pt continues to appear weak and fatigued. Generalized anasarca with pitting edema abd wall to nipples BL. Jaundiced.  HENT: [place], AT. Scleral  icterus CV: RRR. Right IJ temp HD cath (placed 8/5). L sided ICD PULM: BL bibasilar crackles. Speaks in short sentences.   ABD: Protuberant. Pitting edema. +Colostomy EXT: RUE fistula with good thrill and bruit. No surrounding erythema or warmth. Woody changes BL LE c/w chronic edema.   Current Medications Scheduled Meds: . aspirin  325 mg Oral Daily  . atorvastatin  40 mg Oral q1800  . Chlorhexidine Gluconate Cloth  6 each Topical Daily  . colchicine  0.3 mg Oral Daily  . insulin aspart  0-5 Units Subcutaneous QHS  . insulin aspart  0-9 Units Subcutaneous TID WC  . insulin aspart  3 Units Subcutaneous TID WC  . sodium chloride flush  10-40 mL Intracatheter Q12H   Continuous Infusions: . sodium chloride Stopped (07/13/17 0800)  .  ceFAZolin (ANCEF) IV Stopped (07/13/17 0714)  . heparin    . norepinephrine (LEVOPHED) Adult infusion 11 mcg/min (07/13/17 0800)  . dialysis replacement fluid (prismasate) 300 mL/hr at 07/13/17 0759  . dialysis replacement fluid (prismasate) 200 mL/hr at 07/12/17 2335  . dialysate (PRISMASATE) 1,000 mL/hr at 07/13/17 0420    Recent Labs Lab 07/09/17 2132  07/10/17 1115 07/11/17 0335 07/12/17 0424 07/13/17 0337  WBC 18.1*  --  28.0* 31.2* 29.9* 24.0*  NEUTROABS 16.8*  --  26.0*  --  28.1*  --   HGB  8.2*  < > 8.3* 8.1* 7.9* 8.4*  HCT 24.9*  < > 25.2* 24.4* 24.2* 26.1*  MCV 92.6  --  92.6 91.0 89.3 90.3  PLT 158  --  175 193 172 187  < > = values in this interval not displayed.  Recent Labs Lab 07/10/17 0200  07/10/17 1947 07/11/17 0335 07/11/17 1324 07/12/17 0426 07/12/17 1110 07/12/17 1600 07/13/17 0341  NA  --   < > 124* 124* 121* 126* 125* 128* 128*  K  --   < > 3.5 3.7 3.6 3.5 3.7 3.8 4.1  CL  --   < > 89* 90* 90* 90* 92* 94* 94*  CO2  --   < > 20* 19* 17* 21* 19* 21* 23  GLUCOSE  --   < > 268* 263* 309* 252* 252* 206* 185*  BUN  --   < > 106* 109* 109* 90* 77* 70* 56*  CREATININE  --   < > 3.68* 3.69* 3.51* 2.74* 2.47* 2.10*  1.90*  CALCIUM  --   < > 8.4* 8.6* 8.4* 8.2* 8.2* 8.2* 8.4*  PHOS 4.6  --   --   --   --  4.1  --  3.2 2.8  < > = values in this interval not displayed.  Hepatic Function Latest Ref Rng & Units 07/13/2017 07/12/2017 07/12/2017  Total Protein 6.5 - 8.1 g/dL - - 6.2(L)  Albumin 3.5 - 5.0 g/dL 1.9(L) 1.9(L) 2.0(L)  AST 15 - 41 U/L - - 34  ALT 17 - 63 U/L - - 8(L)  Alk Phosphatase 38 - 126 U/L - - 126  Total Bilirubin 0.3 - 1.2 mg/dL - - 4.7(H)  Bilirubin, Direct 0.1 - 0.5 mg/dL - - -   INR: 5/42 (8/8), 3.49 (8/7), 2.2 (8/6).   ASSESSMENT & PLAN  Chronically-ill 61 y/o M with extensive Mhx here with respiratory failure, acute on chronic renal failure, anasarca, shock and MSSA bacteremia. Persistently hypotensive requiring Levophed. Started on CRRT 8/6 due to failure of lasix.  Pulling well, net neg 3.3 L since initiation. He was recently admitted and diuresed however presented 30 lbs up from his dry weight just 2 weeks after discharge.   1. Acute on Chronic Renal Failure, Likely new ESRD, Anasarca: On CRRT since 8/6 and pulling well. Down 3.3 L since initiation. Pulling about 100-168ms/hr via right temp HD cath. CW 196 lbs, Dry weight ~175 lbs. Does have right arm AVF however hold off using while on CRRT 1. Continue CRRT 2. Daily weights, strict I&O 3. Daily renal function panels, replace lytes as indicated 2. Cardiogenic & Septic Shock requiring pressor support, NSTEMI Remains on levophed. Septic shock due to infectious process described below.  1. BP support per primary 3. MSSA Bacteremia, BL LE wounds: ID on board. Cefazolin and Rifampin since adm. Afebrile and white count improving. Repeat Bcx from 8/6  without growth.  1. Abx per ID 2. For TEE on Thursday 4. ICM with AICD placement: Would benefit from line holiday 2/2 MSSA bacteremia however currently not in stable position to tolerate this.  5. Anemia: Hb 8.4today, was 8.8 on admission. Iron studies reveal Fe 28, TIBC 231, Sat ratio 12 and  Ferritin of 238.  1. Needs IV iron replacement once infection resolved 2. Transfuse prn 6. Chronic Atrial Fibrillation: On systemic heparin.  7. Hyponatremia: Lytes improving with CCRT. Sodium 128, K 4.1, phos 2.8 and Mag 2.4.  8. Hyperbilirubinemia, hx of cirrhosis: T-bili 4.7 today with INR of  5.4 despite no warfarin since 8/5.  ?rifampin hepatotoxicity vs shock liver. Discussed with ID, will d/c for now 1. DC rifampin (done)  Einar Gip, DO Internal Medicine - PGY2 07/13/2017, 8:06 AM   Agree with the above excellent note by Dr. Danford Bad. Starting to make headway with fluid removal with CRRT. Has significant anasarca so will take several days to see much of a difference in exam.  Lytes stable. Blood cultures (repeat) from 8/6 neg so far - maybe somewhat reassuring. For TEE Thursday. Not yet in a position where line holiday could be considered.   [name], MD [facility] [phone] Pager 07/13/2017, 10:36 AM

## 2017-07-13 NOTE — Progress Notes (Signed)
PT Cancellation Note  Patient Details Name: [name] MRN: [id number] DOB: 02-10-1956   Cancelled Treatment:    Reason Eval/Treat Not Completed: Patient not medically ready.  Pt still on CRRT, with complications.  RN asks to hold today. 07/13/2017  Donnella Sham, [place] [phone]  (pager)   [name] Bexleigh Theriault 07/13/2017, 6:22 PM

## 2017-07-13 NOTE — Progress Notes (Signed)
Inpatient Diabetes Program Recommendations  AACE/ADA: New Consensus Statement on Inpatient Glycemic Control (2015)  Target Ranges:  Prepandial:   less than 140 mg/dL      Peak postprandial:   less than 180 mg/dL (1-2 hours)      Critically ill patients:  140 - 180 mg/dL   Results for KAYDON, HUSBY (MRN [id number]) as of 07/13/2017 11:20  Ref. Range 07/12/2017 07:34 07/12/2017 11:21 07/12/2017 16:10 07/12/2017 21:44  Glucose-Capillary Latest Ref Range: 65 - 99 mg/dL 328 (H) 221 (H) 268 (H) 190 (H)   Results for JORDELL, OUTTEN (MRN [id number]) as of 07/13/2017 11:20  Ref. Range 07/13/2017 07:49  Glucose-Capillary Latest Ref Range: 65 - 99 mg/dL 213 (H)    Home DM Meds: Lantus 15 units daily (Not taking per home med rec)       Novolog 5 units TID  Current Insulin Orders: Novolog Sensitive Correction Scale/ SSI (0-9 units) TID AC + HS      Novolog 3 units TID with meals      MD- Please consider the following in-hospital insulin adjustments:  1. Start basal insulin: Lantus 15 units daily (~0.15 units/kg dosing based on weight of 89 kg)  2. Increase Novolog Meal Coverage slightly to: Novolog 4 units TID with meals (hold if pt eats <50% of meal)      --Will follow patient during hospitalization--  Wyn Quaker RN, MSN, CDE Diabetes Coordinator Inpatient Glycemic Control Team Team Pager: [phone] (8a-5p)

## 2017-07-13 NOTE — Progress Notes (Signed)
CRITICAL VALUE ALERT  Critical Value:  INR 5.42  Date & Time Notied:  07/13/17 0444  Provider Notified: Dr. Halford Chessman  Orders Received/Actions taken: none

## 2017-07-13 NOTE — Progress Notes (Addendum)
PULMONARY / CRITICAL CARE MEDICINE   Name: [name] MRN: [id number] DOB: Aug 09, 1956    ADMISSION DATE:  07/09/2017 CONSULTATION DATE:  07/10/2017  REFERRING MD:  Sarajane Jews  CHIEF COMPLAINT:  dyspnea  HISTORY OF PRESENT ILLNESS:   61 y/o male with a history of ischecmic cardiomyopathy s/p ICD and CKD was admitted on 8/4 with acute respiratory failure due to pulmonary edema.  Noted on 8/5 to have MSSA bacteremia.  He has been in shock requiring levophed.   REVIEW OF SYSTEMS:   Gen: feels weak, no fever, no chills PULM: no dyspnea, no cough, no mucus production CV: no chest pain MSK: notes R leg pain  SUBJECTIVE:  More awake today Complains of R thigh/hip pain, has been a constant problem for the last week or so  VITAL SIGNS: BP (!) 113/53   Pulse 69   Temp 97.8 F (36.6 C) (Oral)   Resp 14   Ht 5\' 10"  (1.778 m)   Wt 89.1 kg (196 lb 6.9 oz)   SpO2 97%   BMI 28.18 kg/m   HEMODYNAMICS: CVP:  [17 mmHg-22 mmHg] 22 mmHg  VENTILATOR SETTINGS:    INTAKE / OUTPUT: I/O last 3 completed shifts: In: 2331.5 [P.O.:1160; I.V.:671.5; IV Piggyback:500] Out: 1751 [WCHEN:2778; Other:5236]  PHYSICAL EXAMINATION:  General:  Chronically ill appearing, resting comfortably in bed HENT: NCAT OP clear PULM: Some crackles R base, normal effort, normal effort CV: RRR, slight systolic murmur GI: BS+, soft, nontender MSK: normal bulk and tone Derm: jaundice Neuro: awake, alert, no distress, MAEW    LABS:  BMET  Recent Labs Lab 07/12/17 1110 07/12/17 1600 07/13/17 0341  NA 125* 128* 128*  K 3.7 3.8 4.1  CL 92* 94* 94*  CO2 19* 21* 23  BUN 77* 70* 56*  CREATININE 2.47* 2.10* 1.90*  GLUCOSE 252* 206* 185*    Electrolytes  Recent Labs Lab 07/11/17 0335  07/12/17 0426  07/12/17 1110 07/12/17 1600 07/13/17 0337 07/13/17 0341  CALCIUM 8.6*  < > 8.2*  < > 8.2* 8.2*  --  8.4*  MG 2.2  --  2.5*  --   --   --  2.4  --   PHOS  --   --  4.1  --   --  3.2  --  2.8  < >  = values in this interval not displayed.  CBC  Recent Labs Lab 07/11/17 0335 07/12/17 0424 07/13/17 0337  WBC 31.2* 29.9* 24.0*  HGB 8.1* 7.9* 8.4*  HCT 24.4* 24.2* 26.1*  PLT 193 172 187    Coag's  Recent Labs Lab 07/11/17 0335 07/12/17 0424 07/13/17 0337  INR 2.21 3.49 5.42*    Sepsis Markers  Recent Labs Lab 07/10/17 0427 07/10/17 1115 07/11/17 0335 07/12/17 0426  LATICACIDVEN 4.7* 3.9* 3.2*  --   PROCALCITON  --  3.96 3.97 2.78    ABG  Recent Labs Lab 07/11/17 0355  PHART 7.470*  PCO2ART 24.9*  PO2ART 60.0*    Liver Enzymes  Recent Labs Lab 07/10/17 1115  07/12/17 1110 07/12/17 1600 07/13/17 0341 07/13/17 0825  AST 47*  --  34  --   --  40  ALT 16*  --  8*  --   --  7*  ALKPHOS 96  --  126  --   --  143*  BILITOT 2.8*  --  4.7*  --   --  4.8*  ALBUMIN 2.1*  < > 2.0* 1.9* 1.9* 1.9*  < > = values  in this interval not displayed.  Cardiac Enzymes  Recent Labs Lab 07/10/17 0200 07/10/17 1115 07/10/17 1800  TROPONINI 0.11* 1.05* 41.84*    Glucose  Recent Labs Lab 07/11/17 2107 07/12/17 0734 07/12/17 1121 07/12/17 1610 07/12/17 2144 07/13/17 0749  GLUCAP 292* 328* 221* 268* 190* 213*    Imaging No results found.   STUDIES:  07/10/2017 Echo: LVEF 15-20%, severe TR, PA press 80's, no veg seen on valves  CULTURES: 8/4 blood > MSSA 8/6 blood >   ANTIBIOTICS: 8/6 cefazolin >  8/6 rifampin >  8/5 vanc >   SIGNIFICANT EVENTS:   LINES/TUBES: 8/5 RIJ HD cath >   DISCUSSION: 61 y/o male with a past medical history significant for CAD, ischemic cardiomyopathy admitted with MSSA bacteremia and demand ischemia associated shock. Now with acute on chronic renal failure.    ASSESSMENT / PLAN:  PULMONARY A: Acute dyspnea/mild hypoxemia due to acute pulmonary edema OSA P:   Volume removal with CVVHD Monitor O2 saturation  CARDIOVASCULAR A:  Ischemic cardiomyopathy Shock: mixed picture septic and cardiogenic Elevated  PA pressure, likely due to volume overload ICD in place with staph bacteremia Demand ischemia, troponin 40 Paroxysmal afib Known CAD P:  Tele TEE per cardiology Continue volume removal with CVVHD Continue levophed  RENAL A:   Acute on chronic renal failure R arm fistula in place P:   Monitor BMET and UOP Replace electrolytes as needed Volume removal per renal  GASTROINTESTINAL A:   History of colostomy/rectal cancer P:   Cardiac diet  HEMATOLOGIC A:   Anemia without bleeding P:  Hold warfarin Monitor for bleeding  INFECTIOUS A:   MSSA bacteremia with ICD in place, HD cath in place (placed 8/5) R leg pain> source of staph? Has wound R leg;  P:   Antibiotics per ID Will need TEE Will need CVL holiday Follow up repeat blood cultures Consider MRI hip/leg on 8/10  ENDOCRINE A:   Hyperglycemia P:   SSI  NEUROLOGIC A:   No acute issues P:   Monitor neuro status closely   FAMILY  - Updates: I updated his wife at length on 8/7. I informed her that given his multiple severe chronic medical problems many of which are now acutely worse, she needs to consider end of life decisions like whether or not to perform CPR and whether or not he should go on life support.  In 2016 he signed an advanced directive that stated that he did not want to be on life support for more than 2 days so she continues to cling to that.  I have asked her to address the specific issue of CPR as he would not survive to discharge should he go through CPR for cardiac arrest.   My cc time 36 minutes  Roselie Awkward, MD [facility] Pager: [phone] Cell: [phone] After 3pm or if no response, call [phone]   07/13/2017, 10:47 AM

## 2017-07-14 ENCOUNTER — Inpatient Hospital Stay (HOSPITAL_COMMUNITY): Admit: 2017-07-14 | Payer: Medicare Other

## 2017-07-14 DIAGNOSIS — I255 Ischemic cardiomyopathy: Secondary | ICD-10-CM

## 2017-07-14 DIAGNOSIS — I442 Atrioventricular block, complete: Secondary | ICD-10-CM

## 2017-07-14 DIAGNOSIS — T827XXA Infection and inflammatory reaction due to other cardiac and vascular devices, implants and grafts, initial encounter: Secondary | ICD-10-CM

## 2017-07-14 LAB — COOXEMETRY PANEL
Carboxyhemoglobin: 2.2 % — ABNORMAL HIGH (ref 0.5–1.5)
METHEMOGLOBIN: 0.9 % (ref 0.0–1.5)
O2 Saturation: 62.8 %
TOTAL HEMOGLOBIN: 8.6 g/dL — AB (ref 12.0–16.0)

## 2017-07-14 LAB — RENAL FUNCTION PANEL
ALBUMIN: 1.8 g/dL — AB (ref 3.5–5.0)
ANION GAP: 11 (ref 5–15)
Albumin: 1.9 g/dL — ABNORMAL LOW (ref 3.5–5.0)
Anion gap: 9 (ref 5–15)
BUN: 39 mg/dL — ABNORMAL HIGH (ref 6–20)
BUN: 37 mg/dL — AB (ref 6–20)
CALCIUM: 8.3 mg/dL — AB (ref 8.9–10.3)
CALCIUM: 8.3 mg/dL — AB (ref 8.9–10.3)
CO2: 24 mmol/L (ref 22–32)
CO2: 24 mmol/L (ref 22–32)
CREATININE: 1.36 mg/dL — AB (ref 0.61–1.24)
Chloride: 97 mmol/L — ABNORMAL LOW (ref 101–111)
Chloride: 98 mmol/L — ABNORMAL LOW (ref 101–111)
Creatinine, Ser: 1.45 mg/dL — ABNORMAL HIGH (ref 0.61–1.24)
GFR calc Af Amer: >60 mL/min (ref >60)
GFR, EST AFRICAN AMERICAN: 58 mL/min — AB (ref >60)
GFR, EST NON AFRICAN AMERICAN: 50 mL/min — AB (ref >60)
GFR, EST NON AFRICAN AMERICAN: 54 mL/min — AB (ref >60)
GLUCOSE: 152 mg/dL — AB (ref 65–99)
Glucose, Bld: 182 mg/dL — ABNORMAL HIGH (ref 65–99)
PHOSPHORUS: 2.7 mg/dL (ref 2.5–4.6)
PHOSPHORUS: 2.5 mg/dL (ref 2.5–4.6)
POTASSIUM: 4 mmol/L (ref 3.5–5.1)
Potassium: 4.3 mmol/L (ref 3.5–5.1)
SODIUM: 132 mmol/L — AB (ref 135–145)
SODIUM: 131 mmol/L — AB (ref 135–145)

## 2017-07-14 LAB — GLUCOSE, CAPILLARY
GLUCOSE-CAPILLARY: 195 mg/dL — AB (ref 65–99)
GLUCOSE-CAPILLARY: 149 mg/dL — AB (ref 65–99)
Glucose-Capillary: 170 mg/dL — ABNORMAL HIGH (ref 65–99)
Glucose-Capillary: 135 mg/dL — ABNORMAL HIGH (ref 65–99)

## 2017-07-14 LAB — MAGNESIUM: MAGNESIUM: 2.5 mg/dL — AB (ref 1.7–2.4)

## 2017-07-14 LAB — PROTIME-INR
INR: 5.25 — AB
PROTHROMBIN TIME: 49.7 s — AB (ref 11.4–15.2)

## 2017-07-14 MED ORDER — PREDNISONE 20 MG PO TABS
20.0000 mg | ORAL_TABLET | Freq: Every day | ORAL | Status: AC
  Administered 2017-07-15: 20 mg via ORAL
  Filled 2017-07-14: qty 1

## 2017-07-14 NOTE — Progress Notes (Signed)
Patient ID: [name], male   DOB: 04/06/1956, 61 y.o.   MRN: [id number]     Advanced Heart Failure Rounding Note  Primary Cardiologist: Nori Winegar  Subjective:    Admitted with fatigue, dyspnea, recurrent lower extremity swelling and decreased UOP.   Remains on CRRT and norepi 11 mcg. Pulling 100 per hour. Todays CO-OX is 63%.   Remains fatigued. Appetite poor.    Blood CX- 2/2 MSSA ? From LLE wounds?Marland Kitchen On cefazolin and rifampin.   Objective:   Weight Range: 188 lb 7.9 oz (85.5 kg) Body mass index is 27.05 kg/m.   Vital Signs:   Temp:  [94.5 F (34.7 C)-98.6 F (37 C)] 97.6 F (36.4 C) (08/09 0906) Pulse Rate:  [68-78] 72 (08/09 0906) Resp:  [9-113] 113 (08/09 0906) BP: (84-119)/(36-79) 98/52 (08/09 0906) SpO2:  [92 %-100 %] 99 % (08/09 0906) Weight:  [188 lb 7.9 oz (85.5 kg)] 188 lb 7.9 oz (85.5 kg) (08/09 0500) Last BM Date: 07/13/17 (ostomy moved, bag changed)  Weight change: Filed Weights   07/12/17 0615 07/13/17 0500 07/14/17 0500  Weight: 202 lb 13.2 oz (92 kg) 196 lb 6.9 oz (89.1 kg) 188 lb 7.9 oz (85.5 kg)    Intake/Output:   Intake/Output Summary (Last 24 hours) at 07/14/17 0953 Last data filed at 07/14/17 0800  Gross per 24 hour  Intake           1076.9 ml  Output             3670 ml  Net          -2593.1 ml      Physical Exam  CVP 14 General:  Chronically ill appearing. No resp difficulty HEENT: normal. RIJ Neck: supple. JVP to jaw  Carotids 2+ bilat; no bruits. No lymphadenopathy or thryomegaly appreciated. Cor: PMI nondisplaced. Irregular ate & rhythm. No rubs, gallops or murmurs. Lungs: clear Abdomen: soft, nontender, nondistended. No hepatosplenomegaly. No bruits or masses. Good bowel sounds. Extremities: no cyanosis, clubbing, rash, R and LLE 2-3+ edema Neuro: alert & orientedx3, cranial nerves grossly intact. moves all 4 extremities w/o difficulty. Affect pleasant    Telemetry  A fib V pacing 60-70s Personally reviewed.   Labs    CBC  Recent Labs  07/12/17 0424 07/13/17 0337  WBC 29.9* 24.0*  NEUTROABS 28.1*  --   HGB 7.9* 8.4*  HCT 24.2* 26.1*  MCV 89.3 90.3  PLT 172 187   Basic Metabolic Panel  Recent Labs  07/13/17 0337  07/13/17 1707 07/14/17 0416  NA  --   < > 129* 132*  K  --   < > 4.2 4.3  CL  --   < > 94* 97*  CO2  --   < > 23 24  GLUCOSE  --   < > 198* 182*  BUN  --   < > 47* 39*  CREATININE  --   < > 1.57* 1.45*  CALCIUM  --   < > 8.4* 8.3*  MG 2.4  --   --  2.5*  PHOS  --   < > 2.8 2.7  < > = values in this interval not displayed. Liver Function Tests  Recent Labs  07/12/17 1110  07/13/17 0825 07/13/17 1707 07/14/17 0416  AST 34  --  40  --   --   ALT 8*  --  7*  --   --   ALKPHOS 126  --  143*  --   --  BILITOT 4.7*  --  4.8*  --   --   PROT 6.2*  --  6.3*  --   --   ALBUMIN 2.0*  < > 1.9* 1.8* 1.9*  < > = values in this interval not displayed. No results for input(s): LIPASE, AMYLASE in the last 72 hours. Cardiac Enzymes No results for input(s): CKTOTAL, CKMB, CKMBINDEX, TROPONINI in the last 72 hours.  BNP: BNP (last 3 results)  Recent Labs  06/13/17 1205 07/09/17 2151  BNP 1,151.0* 1,553.9*    ProBNP (last 3 results) No results for input(s): PROBNP in the last 8760 hours.   D-Dimer No results for input(s): DDIMER in the last 72 hours. Hemoglobin A1C No results for input(s): HGBA1C in the last 72 hours. Fasting Lipid Panel No results for input(s): CHOL, HDL, LDLCALC, TRIG, CHOLHDL, LDLDIRECT in the last 72 hours. Thyroid Function Tests No results for input(s): TSH, T4TOTAL, T3FREE, THYROIDAB in the last 72 hours.  Invalid input(s): FREET3  Other results:   Imaging    No results found.   Medications:     Scheduled Medications: . aspirin  325 mg Oral Daily  . Chlorhexidine Gluconate Cloth  6 each Topical Daily  . colchicine  0.3 mg Oral Daily  . insulin aspart  0-5 Units Subcutaneous QHS  . insulin aspart  0-9 Units Subcutaneous TID WC    . insulin aspart  3 Units Subcutaneous TID WC  . [START ON 07/15/2017] predniSONE  20 mg Oral Q breakfast  . sodium chloride flush  10-40 mL Intracatheter Q12H    Infusions: . sodium chloride Stopped (07/13/17 0800)  .  ceFAZolin (ANCEF) IV Stopped (07/14/17 1610)  . heparin    . norepinephrine (LEVOPHED) Adult infusion 11 mcg/min (07/13/17 1925)  . dialysis replacement fluid (prismasate) 300 mL/hr at 07/14/17 0005  . dialysis replacement fluid (prismasate) 200 mL/hr at 07/14/17 0005  . dialysate (PRISMASATE) 1,000 mL/hr at 07/14/17 0508    PRN Medications: sodium chloride, acetaminophen, alum & mag hydroxide-simeth, fentaNYL (SUBLIMAZE) injection, heparin, heparin, heparin, HYDROcodone-acetaminophen, magic mouthwash w/lidocaine, ondansetron (ZOFRAN) IV, sodium chloride flush    Patient Profile   [name] is a 61 year old with a history of DMII, SAH 2016, rectal cancer, CKD Stage IV, chronic A fib, S/P AVN ablation, PAF, CAD BMS LAD 2011 BMS LAD 2009, OSA, chronic systolic heart failure, St Jude BiV ICD admitted with AKI and shock with elevated lactate, suspect cardiogenic.   Assessment/Plan   1. Acute on chronic systolic CHF/cardiogenic shock: Last echo in 8/17 with EF 15-20%.  Ischemic cardiomyopathy, has St Jude BiV ICD (despite chronic afib appears to be effectively BiV pacing).  Had recent admission (discharged 06/21/17) requiring milrinone support and diuresis.  Admitted with soft BP (SBP 80s-90s) + elevated lactate.  Some concern initially for PNA/septic shock, but suspect CXR shows R effusion rather than PNA, no fever.   Remains on CRRT and continue norepi 11 mcg.  CVP 14  Todays CO-OX is 63%.  2. AKI on CKD stage 4: Baseline creatinine around 2.5.  He required brief HD in the past, has RUE fistula. Started CRRT 8/6. Nephrology appreciated.  3. Atrial fibrillation: Chronic.  On warfarin, INR  5.2.  He has been off coumadin for 3 days.  4. CAD: No CP. Hold statin for now. Hold  aspirin.   5. ID: WBC trending down 29>24.  Covering with vanc/Zosyn for PNA given initial concern for septic shock, but suspect R-sided pleural effusion and cardiogenic shock. Blood  CX 2/2 MSSA. Antibiotics narrowed rifampin and ancef. Has BiV.  Eventually will need TEE 6. H/O Rectal CA with diverting colostomy  7. Type II diabetes: Sliding scale.  8. Liver failure - LFTs in am. Statin stopped.  Have discussed with PharmD about possible need to decrease rirfam  Length of Stay: 4  Amy Clegg, NP  07/14/2017, 9:53 AM  Advanced Heart Failure Team Pager [phone] (M-F; 7a - 4p)  Please contact [facility] for night-coverage after hours (4p -7a ) and weekends on [email or web address]  Agree with above.   Remains critically ill. He continues on CVVHD pulling 100/hr. BP remains soft despite NE at 11. May need to increase. Weight today is 188. Previous baseline 175. More alert today. Appetite slightly improved. No fevers.  On exam Sitting up in bed.  +scleral icterus RIJ Trialysis cath. JVP up Cor RRR Lungs crackles at bases Ab soft. Distended. + LLL colostomy Ext 2-3+ edema to thighs  Continues to struggle with MSOF in setting of combined cardiogenic/septic shock. Volume status improving with CVVHD but still about 15 pounds above baseline. Continue CVVHD. Continue pressor support. Cont abx.   If continues to recover will need TEE and ICD extraction follwoed by line holiday.   Prognosis remains extremely tenuous.   CRITICAL CARE Performed by: Arvilla Meres  Total critical care time: 35 minutes  Critical care time was exclusive of separately billable procedures and treating other patients.  Critical care was necessary to treat or prevent imminent or life-threatening deterioration.  Critical care was time spent personally by me (independent of midlevel providers or residents) on the following activities: development of treatment plan with patient and/or surrogate as well as nursing,  discussions with consultants, evaluation of patient's response to treatment, examination of patient, obtaining history from patient or surrogate, ordering and performing treatments and interventions, ordering and review of laboratory studies, ordering and review of radiographic studies, pulse oximetry and re-evaluation of patient's condition.  Arvilla Meres, MD  12:24 PM

## 2017-07-14 NOTE — Progress Notes (Signed)
PULMONARY / CRITICAL CARE MEDICINE   Name: [name] MRN: [id number] DOB: 12/09/1955    ADMISSION DATE:  07/09/2017 CONSULTATION DATE:  07/10/2017  REFERRING MD:  Sarajane Jews  CHIEF COMPLAINT:  dyspnea  HISTORY OF PRESENT ILLNESS:   61 y/o male with a history of ischecmic cardiomyopathy s/p ICD and CKD was admitted on 8/4 with acute respiratory failure due to pulmonary edema.  Noted on 8/5 to have MSSA bacteremia.  He has been in shock requiring levophed.   REVIEW OF SYSTEMS:   Gen: feels weak, no fever, no chills PULM: no dyspnea, no cough, no mucus production CV: no chest pain MSK: notes R leg pain  SUBJECTIVE:  Notes some heel pain now R leg Remains on levophed  VITAL SIGNS: BP (!) 98/52   Pulse 72   Temp 97.6 F (36.4 C) (Oral)   Resp (!) 113   Ht 5\' 10"  (1.778 m)   Wt 85.5 kg (188 lb 7.9 oz)   SpO2 99%   BMI 27.05 kg/m   HEMODYNAMICS: CVP:  [17 mmHg-22 mmHg] 21 mmHg  VENTILATOR SETTINGS:    INTAKE / OUTPUT: I/O last 3 completed shifts: In: 1980.8 [P.O.:1180; I.V.:500.8; IV Piggyback:300] Out: 4270 [Urine:605; Other:5358]  PHYSICAL EXAMINATION:  General:  Chronically ill , resting comfortably in bed HENT: NCAT OP clear PULM: CTA B, normal effort CV: Irreg irreg, no mgr GI: BS+, soft, nontender MSK: normal bulk and tone Neuro: awake, alert, no distress, MAEW     LABS:  BMET  Recent Labs Lab 07/13/17 0341 07/13/17 1707 07/14/17 0416  NA 128* 129* 132*  K 4.1 4.2 4.3  CL 94* 94* 97*  CO2 23 23 24   BUN 56* 47* 39*  CREATININE 1.90* 1.57* 1.45*  GLUCOSE 185* 198* 182*    Electrolytes  Recent Labs Lab 07/12/17 0426  07/13/17 0337 07/13/17 0341 07/13/17 1707 07/14/17 0416  CALCIUM 8.2*  < >  --  8.4* 8.4* 8.3*  MG 2.5*  --  2.4  --   --  2.5*  PHOS 4.1  < >  --  2.8 2.8 2.7  < > = values in this interval not displayed.  CBC  Recent Labs Lab 07/11/17 0335 07/12/17 0424 07/13/17 0337  WBC 31.2* 29.9* 24.0*  HGB 8.1* 7.9*  8.4*  HCT 24.4* 24.2* 26.1*  PLT 193 172 187    Coag's  Recent Labs Lab 07/12/17 0424 07/13/17 0337 07/14/17 0416  INR 3.49 5.42* 5.25*    Sepsis Markers  Recent Labs Lab 07/10/17 0427 07/10/17 1115 07/11/17 0335 07/12/17 0426  LATICACIDVEN 4.7* 3.9* 3.2*  --   PROCALCITON  --  3.96 3.97 2.78    ABG  Recent Labs Lab 07/11/17 0355  PHART 7.470*  PCO2ART 24.9*  PO2ART 60.0*    Liver Enzymes  Recent Labs Lab 07/10/17 1115  07/12/17 1110  07/13/17 0825 07/13/17 1707 07/14/17 0416  AST 47*  --  34  --  40  --   --   ALT 16*  --  8*  --  7*  --   --   ALKPHOS 96  --  126  --  143*  --   --   BILITOT 2.8*  --  4.7*  --  4.8*  --   --   ALBUMIN 2.1*  < > 2.0*  < > 1.9* 1.8* 1.9*  < > = values in this interval not displayed.  Cardiac Enzymes  Recent Labs Lab 07/10/17 0200 07/10/17 1115 07/10/17 1800  TROPONINI 0.11* 1.05* 41.84*    Glucose  Recent Labs Lab 07/12/17 2144 07/13/17 0749 07/13/17 1124 07/13/17 1528 07/13/17 2139 07/14/17 0904  GLUCAP 190* 213* 206* 186* 219* 195*    Imaging No results found.   STUDIES:  07/10/2017 Echo: LVEF 15-20%, severe TR, PA press 80's, no veg seen on valves  CULTURES: 8/4 blood > MSSA 8/6 blood >   ANTIBIOTICS: 8/6 cefazolin >  8/6 rifampin >  8/5 vanc >   SIGNIFICANT EVENTS:   LINES/TUBES: 8/5 RIJ HD cath >   DISCUSSION: 61 y/o male with a past medical history significant for CAD, ischemic cardiomyopathy admitted with MSSA bacteremia and demand ischemia associated shock. Now with acute on chronic renal failure.    ASSESSMENT / PLAN:  PULMONARY A: Acute dyspnea/mild hypoxemia due to acute pulmonary edema OSA P:   Continue volume removal with CVVHD Continue to monitor O2 saturation CPAP qHS per home settings  CARDIOVASCULAR A:  Ischemic cardiomyopathy Shock: mixed picture septic and cardiogenic Elevated PA pressure, likely due to volume overload ICD in place with staph  bacteremia Demand ischemia, troponin 40 Paroxysmal afib Known CAD P:  Continue levophed Continue volume removal with CVVHD TEE/Pacer wire disposition per cardiology/ID  RENAL A:   Acute on chronic renal failure R arm fistula in place P:   Monitor BMET and UOP Replace electrolytes as needed CVVHD per renal  GASTROINTESTINAL A:   History of colostomy/rectal cancer P:   Cardiac diet  HEMATOLOGIC A:   Anemia without bleeding Coagulopathy from sepsis/antibiotics/cirrhosis P:  Hold warfarin Monitor for bleeding  INFECTIOUS A:   MSSA bacteremia with ICD in place, HD cath in place (placed 8/5) R leg pain> source of staph? Has wound R leg;  P:   TEE per cardiology/ID Will need line holiday Continue antibiotics per ID  ENDOCRINE A:   Hyperglycemia P:   SSI  NEUROLOGIC A:   No acute issues P:   Monitor closely for delirium  ORTHO A: Achilles tendon pain, hip/knee pain> arthritic? P: Low dose prednisone today x1 dose Decide if he needs more based on today's response   FAMILY  - Updates: see notes from 8/7; wife updated daily  My cc time 33 minutes  Roselie Awkward, MD [facility] Pager: [phone] Cell: [phone] After 3pm or if no response, call [phone]   07/14/2017, 9:36 AM

## 2017-07-14 NOTE — Progress Notes (Signed)
RT NOTE:  Pt stated that he does not wear CPAP@ home. He is refusing CPAP therapy tonight. Pt understands he can contact RT if he changes his mind and CPAP will be brought to room.

## 2017-07-14 NOTE — Progress Notes (Signed)
PT Cancellation Note  Patient Details Name: [name] MRN: [id number] DOB: Feb 25, 1956   Cancelled Treatment:    Reason Eval/Treat Not Completed: Patient not medically ready.  Remains on CRRT.  Nursing not available to assist this afternoon. 07/14/2017  Donnella Sham, [place] [phone]  (pager)   [name] [name] 07/14/2017, 6:29 PM

## 2017-07-14 NOTE — Progress Notes (Signed)
ANTICOAGULATION CONSULT NOTE   Pharmacy Consult for heparin Indication: atrial fibrillation  No Known Allergies  Patient Measurements: Height: 5\' 10"  (177.8 cm) Weight: 188 lb 7.9 oz (85.5 kg) IBW/kg (Calculated) : 73  Heparin dosing weight: 90.6 kg  Vital Signs: Temp: 98.6 F (37 C) (08/09 0401) Temp Source: Oral (08/09 0401) BP: 110/56 (08/09 0700) Pulse Rate: 70 (08/09 0700)  Labs:  Recent Labs  07/12/17 0424  07/13/17 0337 07/13/17 0341 07/13/17 1707 07/14/17 0416  HGB 7.9*  --  8.4*  --   --   --   HCT 24.2*  --  26.1*  --   --   --   PLT 172  --  187  --   --   --   LABPROT 35.8*  --  51.0*  --   --  49.7*  INR 3.49  --  5.42*  --   --  5.25*  CREATININE  --   < >  --  1.90* 1.57* 1.45*  < > = values in this interval not displayed.  Estimated Creatinine Clearance: 54.5 mL/min (A) (by C-G formula based on SCr of 1.45 mg/dL (H)).  Assessment: 62 year old male with chronic afib on warfarin prior to admission. Patient recently seen in clinic with supratherapeutic INR 3.7 dose reduced to 1mg  of coumadin daily except 2mg  on Tuesday, Thursday, Saturday.  Pt is now requiring CRRT. Planning to hold warfarin and begin heparin when INR eventually trends down, still >5 this am. Patient may require a small dose of vitamin k but no immediate needs at this time.  Goal of Therapy:  Heparin level goal 0.3-0.7 INR 2-3 Monitor platelets by anticoagulation protocol: Yes   Plan:  -Begin heparin when INR < 2 -Can likely begin at 1300 units/hr, order not placed yet -Follow daily INR for now  Erin Hearing PharmD., BCPS Clinical Pharmacist Pager [phone] 07/14/2017 8:42 AM

## 2017-07-14 NOTE — Progress Notes (Signed)
OT Cancellation Note  Patient Details Name: [name] MRN: [id number] DOB: 11-04-1956   Cancelled Treatment:    Reason Eval/Treat Not Completed: Medical issues which prohibited therapy.  Remains on CRRT.  Will initiate OT eval once medically stable.  Karalee Hauter [place], OTR/L [phone]   Lucille Passy M 07/14/2017, 11:25 AM

## 2017-07-14 NOTE — Progress Notes (Signed)
[facility]  Date of Admission:  07/09/2017   Total days of antibiotics 5        Day 5 Cefazolin        Rifampin stopped 8/8         ASSESSMENT and PLAN:  MSSA Bacteremia  -2/2 Blood culture and biofire both resulted for MSSA (8/7) -2/2 Blood culture showed no growth for 2 days (8/8) -TEE pending  -AICD likely infected with MSSA bacteremia. Will not pursue intervention to remove at this time as he is unlikely to survive.  -Consider speaking to palliative care  -Continue cefazolin  -Provide catheter holiday whenever possible -Please wait for no growth for 4 days prior to placing any lines.  -skin breakdown and cellulitis are likely source of infection  Right Hip Pain Possibly due to gout flare, continue colchicine -order MRI to evaluate hip    . aspirin  325 mg Oral Daily  . Chlorhexidine Gluconate Cloth  6 each Topical Daily  . colchicine  0.3 mg Oral Daily  . insulin aspart  0-5 Units Subcutaneous QHS  . insulin aspart  0-9 Units Subcutaneous TID WC  . insulin aspart  3 Units Subcutaneous TID WC  . sodium chloride flush  10-40 mL Intracatheter Q12H    SUBJECTIVE: [name] was seen laying in his bed. He seemed more lethargic today and slowed in response. He denied any sob or chest pain. He stated that he continues to have pain in his right hip.   Review of Systems: ROS as above  No Known Allergies  OBJECTIVE: Vitals:   07/14/17 0615 07/14/17 0630 07/14/17 0645 07/14/17 0700  BP: (!) 113/53 (!) 116/52 (!) 108/53 (!) 110/56  Pulse: 70 77 74 70  Resp: 18 13 15 11   Temp:      TempSrc:      SpO2: 99% 97% 97% 97%  Weight:      Height:       Body mass index is 27.05 kg/m.  Physical Exam  Constitutional: He appears lethargic. He has a sickly appearance.  HENT:  Head: Normocephalic and atraumatic.  Eyes: Conjunctivae are normal.  Cardiovascular: Normal rate, regular rhythm, normal heart sounds and intact distal pulses.     Pulmonary/Chest: Breath sounds normal. No respiratory distress. He has no wheezes.  Abdominal: Soft. He exhibits distension. There is no tenderness.  Neurological: He appears lethargic.  Psychiatric: Mood, memory, affect and judgment normal.    Lab Results Lab Results  Component Value Date   WBC 24.0 (H) 07/13/2017   HGB 8.4 (L) 07/13/2017   HCT 26.1 (L) 07/13/2017   MCV 90.3 07/13/2017   PLT 187 07/13/2017    Lab Results  Component Value Date   CREATININE 1.45 (H) 07/14/2017   BUN 39 (H) 07/14/2017   NA 132 (L) 07/14/2017   K 4.3 07/14/2017   CL 97 (L) 07/14/2017   CO2 24 07/14/2017    Lab Results  Component Value Date   ALT 7 (L) 07/13/2017   AST 40 07/13/2017   ALKPHOS 143 (H) 07/13/2017   BILITOT 4.8 (H) 07/13/2017     Microbiology: Recent Results (from the past 240 hour(s))  Blood culture (routine x 2)     Status: Abnormal   Collection Time: 07/09/17 10:04 PM  Result Value Ref Range Status   Specimen Description BLOOD LEFT HAND  Final   Special Requests IN PEDIATRIC BOTTLE Blood Culture adequate volume  Final  Culture  Setup Time   Final    GRAM POSITIVE COCCI IN CLUSTERS IN PEDIATRIC BOTTLE CRITICAL RESULT CALLED TO, READ BACK BY AND VERIFIED WITH: R RUMBARGER,PHARMD AT 2536 07/10/17 BY L BENFIELD    Culture (A)  Final    STAPHYLOCOCCUS AUREUS SUSCEPTIBILITIES PERFORMED ON PREVIOUS CULTURE WITHIN THE LAST 5 DAYS.    Report Status 07/12/2017 FINAL  Final  Blood Culture ID Panel (Reflexed)     Status: Abnormal   Collection Time: 07/09/17 10:04 PM  Result Value Ref Range Status   Enterococcus species NOT DETECTED NOT DETECTED Final   Listeria monocytogenes NOT DETECTED NOT DETECTED Final   Staphylococcus species DETECTED (A) NOT DETECTED Final    Comment: CRITICAL RESULT CALLED TO, READ BACK BY AND VERIFIED WITH: R RUMBARGER,PHARMD AT 1613 07/10/17 BY L BENFIELD    Staphylococcus aureus DETECTED (A) NOT DETECTED Final    Comment: Methicillin (oxacillin)  susceptible Staphylococcus aureus (MSSA). Preferred therapy is anti staphylococcal beta lactam antibiotic (Cefazolin or Nafcillin), unless clinically contraindicated. CRITICAL RESULT CALLED TO, READ BACK BY AND VERIFIED WITH: R RUMBARGER,PHARMD AT 1613 07/10/17 BY L BENFIELD    Methicillin resistance NOT DETECTED NOT DETECTED Final   Streptococcus species NOT DETECTED NOT DETECTED Final   Streptococcus agalactiae NOT DETECTED NOT DETECTED Final   Streptococcus pneumoniae NOT DETECTED NOT DETECTED Final   Streptococcus pyogenes NOT DETECTED NOT DETECTED Final   Acinetobacter baumannii NOT DETECTED NOT DETECTED Final   Enterobacteriaceae species NOT DETECTED NOT DETECTED Final   Enterobacter cloacae complex NOT DETECTED NOT DETECTED Final   Escherichia coli NOT DETECTED NOT DETECTED Final   Klebsiella oxytoca NOT DETECTED NOT DETECTED Final   Klebsiella pneumoniae NOT DETECTED NOT DETECTED Final   Proteus species NOT DETECTED NOT DETECTED Final   Serratia marcescens NOT DETECTED NOT DETECTED Final   Haemophilus influenzae NOT DETECTED NOT DETECTED Final   Neisseria meningitidis NOT DETECTED NOT DETECTED Final   Pseudomonas aeruginosa NOT DETECTED NOT DETECTED Final   Candida albicans NOT DETECTED NOT DETECTED Final   Candida glabrata NOT DETECTED NOT DETECTED Final   Candida krusei NOT DETECTED NOT DETECTED Final   Candida parapsilosis NOT DETECTED NOT DETECTED Final   Candida tropicalis NOT DETECTED NOT DETECTED Final  Blood culture (routine x 2)     Status: Abnormal   Collection Time: 07/09/17 10:08 PM  Result Value Ref Range Status   Specimen Description BLOOD LEFT FOREARM  Final   Special Requests   Final    BOTTLES DRAWN AEROBIC AND ANAEROBIC Blood Culture adequate volume   Culture  Setup Time   Final    GRAM POSITIVE COCCI IN CLUSTERS IN BOTH AEROBIC AND ANAEROBIC BOTTLES CRITICAL RESULT CALLED TO, READ BACK BY AND VERIFIED WITH: R RUMBARGER,PHARMD AT 1613 07/10/17 BY L BENFIELD      Culture STAPHYLOCOCCUS AUREUS (A)  Final   Report Status 07/12/2017 FINAL  Final   Organism ID, Bacteria STAPHYLOCOCCUS AUREUS  Final      Susceptibility   Staphylococcus aureus - MIC*    CIPROFLOXACIN <=0.5 SENSITIVE Sensitive     ERYTHROMYCIN <=0.25 SENSITIVE Sensitive     GENTAMICIN <=0.5 SENSITIVE Sensitive     OXACILLIN 0.5 SENSITIVE Sensitive     TETRACYCLINE <=1 SENSITIVE Sensitive     VANCOMYCIN <=0.5 SENSITIVE Sensitive     TRIMETH/SULFA <=10 SENSITIVE Sensitive     CLINDAMYCIN <=0.25 SENSITIVE Sensitive     RIFAMPIN <=0.5 SENSITIVE Sensitive     Inducible Clindamycin NEGATIVE Sensitive     *  STAPHYLOCOCCUS AUREUS  Urine culture     Status: None   Collection Time: 07/09/17 10:15 PM  Result Value Ref Range Status   Specimen Description URINE, CATHETERIZED  Final   Special Requests NONE  Final   Culture NO GROWTH  Final   Report Status 07/11/2017 FINAL  Final  MRSA PCR Screening     Status: None   Collection Time: 07/10/17  2:48 AM  Result Value Ref Range Status   MRSA by PCR NEGATIVE NEGATIVE Final    Comment:        The GeneXpert MRSA Assay (FDA approved for NASAL specimens only), is one component of a comprehensive MRSA colonization surveillance program. It is not intended to diagnose MRSA infection nor to guide or monitor treatment for MRSA infections.   Culture, blood (Routine X 2) w Reflex to ID Panel     Status: None (Preliminary result)   Collection Time: 07/11/17  4:22 PM  Result Value Ref Range Status   Specimen Description BLOOD BLOOD LEFT HAND  Final   Special Requests   Final    BOTTLES DRAWN AEROBIC ONLY Blood Culture adequate volume   Culture NO GROWTH 2 DAYS  Final   Report Status PENDING  Incomplete  Culture, blood (Routine X 2) w Reflex to ID Panel     Status: None (Preliminary result)   Collection Time: 07/11/17  4:25 PM  Result Value Ref Range Status   Specimen Description BLOOD BLOOD LEFT HAND  Final   Special Requests   Final     BOTTLES DRAWN AEROBIC ONLY Blood Culture adequate volume   Culture NO GROWTH 2 DAYS  Final   Report Status PENDING  Incomplete    Lars Mage, MD [facility] [phone] pager   [phone] cell 07/14/2017, 8:31 AM

## 2017-07-14 NOTE — Progress Notes (Signed)
[name] Bethany Molt, DO Length of Stay: 4 [facility] Rounding Note  SUBJECTIVE:  Brief HPI: 61 y/o M w/ sCHF s/p AICD placement, advanced CKD now likely ESRD with RUE AVF placement, cirrhosis and history of rectal cancer s/p resection and colostomy placement admitted 07/09/17 with respiratory failure, demand ischemia and volume overload. Presented in mixed cardiogenic shock (MSSA bacteremia). UOP trailed off despite lasix drip and was started on CRRT 8/6. Recently admitted 7/9-7/17 for volume overload and was diuresed with IV lasix, metolazone and milrinone until day of discharge when he was sent home with home dose of torsemide. Developed SOB, worsening edema and has gained nearly 30 lbs since discharge. He sees Dr. Hinda Lenis as an outpatient and required HD last year from July until November 2017 for AKI in CKD 2/2 ischemic ATN.   Interval events: "everything feels better" Net out 2.6 L yesterday, pulling 100-272ms/hr on CRRT.  Net neg 2.6L yesterday with 365 mls recorded UOP.  Weight 188, was 196 yesterday.  Prev dry weight felt to be 175.   Remains on levophed10.3 mcgs/min.  INR 5.25 today, 5.4 yesterday. Rifampin DC yesterday due to concerns of hepatotoxicity and questionable benefit currently.   OBJECTIVE  Intake/Output Summary (Last 24 hours) at 07/14/17 0804 Last data filed at 07/14/17 0800  Gross per 24 hour  Intake           1187.2 ml  Output             3808 ml  Net          -2620.8 ml   Filed Weights   07/12/17 0615 07/13/17 0500 07/14/17 0500  Weight: 202 lb 13.2 oz (92 kg) 196 lb 6.9 oz (89.1 kg) 188 lb 7.9 oz (85.5 kg)   Physical Exam   Vitals: Blood pressure (!) 110/56, pulse 70, temperature 98.6 F (37 C), temperature source Oral, resp. rate 11, height '5\' 10"'$  (1.778 m), weight 188 lb 7.9 oz (85.5 kg), SpO2 97 %. GEN: Chronically-ill appearing caucasian male resting in bed. Pt continues to appear weak and fatigued. Generalized anasarca with pitting edema, improved.  Jaundiced.  HENT: [place], AT. Scleral icterus CV: RRR. Right IJ temp HD cath (placed 8/5). L sided ICD nontender. PULM: BL bibasilar crackles. Speaks in short sentences.   ABD: Protuberant. Pitting edema. +Colostomy EXT: RUE fistula with good thrill and bruit. No surrounding erythema or warmth. Woody changes BL LE c/w chronic edema.   Current Medications Scheduled Meds: . aspirin  325 mg Oral Daily  . Chlorhexidine Gluconate Cloth  6 each Topical Daily  . colchicine  0.3 mg Oral Daily  . insulin aspart  0-5 Units Subcutaneous QHS  . insulin aspart  0-9 Units Subcutaneous TID WC  . insulin aspart  3 Units Subcutaneous TID WC  . sodium chloride flush  10-40 mL Intracatheter Q12H   Continuous Infusions: . sodium chloride Stopped (07/13/17 0800)  .  ceFAZolin (ANCEF) IV Stopped (07/14/17 06195  . heparin    . norepinephrine (LEVOPHED) Adult infusion 11 mcg/min (07/13/17 1925)  . dialysis replacement fluid (prismasate) 300 mL/hr at 07/14/17 0005  . dialysis replacement fluid (prismasate) 200 mL/hr at 07/14/17 0005  . dialysate (PRISMASATE) 1,000 mL/hr at 07/14/17 0508    Recent Labs Lab 07/09/17 2132  07/10/17 1115 07/11/17 0335 07/12/17 0424 07/13/17 0337  WBC 18.1*  --  28.0* 31.2* 29.9* 24.0*  NEUTROABS 16.8*  --  26.0*  --  28.1*  --   HGB 8.2*  < >  8.3* 8.1* 7.9* 8.4*  HCT 24.9*  < > 25.2* 24.4* 24.2* 26.1*  MCV 92.6  --  92.6 91.0 89.3 90.3  PLT 158  --  175 193 172 187  < > = values in this interval not displayed.  Recent Labs Lab 07/10/17 0200  07/11/17 1324 07/12/17 0426 07/12/17 0723 07/12/17 1110 07/12/17 1600 07/13/17 0341 07/13/17 1707 07/14/17 0416  NA  --   < > 121* 126*  --  125* 128* 128* 129* 132*  K  --   < > 3.6 3.5  --  3.7 3.8 4.1 4.2 4.3  CL  --   < > 90* 90*  --  92* 94* 94* 94* 97*  CO2  --   < > 17* 21*  --  19* 21* '23 23 24  '$ GLUCOSE  --   < > 309* 252*  --  252* 206* 185* 198* 182*  BUN  --   < > 109* 90*  --  77* 70* 56* 47* 39*   CREATININE  --   < > 3.51* 2.74*  --  2.47* 2.10* 1.90* 1.57* 1.45*  CALCIUM  --   < > 8.4* 8.2* 7.9* 8.2* 8.2* 8.4* 8.4* 8.3*  PHOS 4.6  --   --  4.1  --   --  3.2 2.8 2.8 2.7  < > = values in this interval not displayed.  Hepatic Function Latest Ref Rng & Units 07/14/2017 07/13/2017 07/13/2017  Total Protein 6.5 - 8.1 g/dL - - 6.3(L)  Albumin 3.5 - 5.0 g/dL 1.9(L) 1.8(L) 1.9(L)  AST 15 - 41 U/L - - 40  ALT 17 - 63 U/L - - 7(L)  Alk Phosphatase 38 - 126 U/L - - 143(H)  Total Bilirubin 0.3 - 1.2 mg/dL - - 4.8(H)  Bilirubin, Direct 0.1 - 0.5 mg/dL - - 3.0(H)   INR: 5.25 (8/9), 5.4 (8/8), 3.49 (8/7), 2.2 (8/6).   ASSESSMENT & PLAN  Chronically-ill 62 y/o M with extensive Mhx here with respiratory failure, acute on chronic renal failure, anasarca, shock and MSSA bacteremia. Persistently hypotensive requiring Levophed. Started on CRRT 8/6 due to failure of lasix.  Pulling well, net neg 6.1 L since initiation. He was recently admitted and diuresed however presented 30 lbs up from his dry weight just 2 weeks after discharge.   1. Acute on Chronic Renal Failure, Likely new ESRD, Anasarca: On CRRT since 8/6 and continues to pull well, about 100-215ms/hr via right temp HD cath. Net neg 6.1L since initiation. Down an additional 2.6L yesterday. Pulling about 100-1864m/hr via right temp HD cath. CW 188lbs, Dry weight ~175 lbs. Does have right arm AVF however hold off using while on CRRT  1. Continue CRRT 2. Daily weights, strict I&O 3. Daily renal function panels, replace lytes as indicated 2. Cardiogenic & Septic Shock requiring pressor support, NSTEMI: Remains on levophed. Septic shock due to infectious process described below.  1. BP support per crit care 3. MSSA Bacteremia, BL LE wounds: ID on board. Has been on Cefazolin and Rifampin however rifampin d/c yesterday 2/2 concerns of hepatotoxicity and questionable benefit currently. Repeat Bcx from 8/6  without growth.  1. Abx per ID 2. Will  eventually need TEE and removal of ICD with temp pacer however not in position to tolerate currently 4. ICM with AICD placement: As above. 5. Anemia, Iron deficiency: Hb 8.4 yest, was 8.8 on admission. Iron deficient. 1. Needs IV iron replacement once infection resolved 2. Transfuse prn 6. Chronic Atrial  Fibrillation: On systemic heparin.  7. Hyponatremia: Lytes improving with CCRT. 1. Hyperbilirubinemia, hx of cirrhosis: elevated T-bili to nearly 5 with INR of 5.4 despite several days without warfarn. ?Rifampin hepatotox vs shock liver   Einar Gip, DO Internal Medicine - PGY2 07/14/2017, 8:04 AM   Agree with excellent note above by Dr. Danford Bad. Tolerating CRRT and fluid coming off but remains pressor dependent, on ATB's for MSSA bacteremia, TEE on hold 2/2 medical instability, liver failure, prolonged INR. Despite all this feels better, more alert. We will continue fluid removal/dialytic support.  [name], MD [facility] [phone] Pager 07/14/2017, 11:04 AM

## 2017-07-15 LAB — CBC
HEMATOCRIT: 26.1 % — AB (ref 39.0–52.0)
HEMOGLOBIN: 8.3 g/dL — AB (ref 13.0–17.0)
MCH: 29.3 pg (ref 26.0–34.0)
MCHC: 31.8 g/dL (ref 30.0–36.0)
MCV: 92.2 fL (ref 78.0–100.0)
Platelets: 149 10*3/uL — ABNORMAL LOW (ref 150–400)
RBC: 2.83 MIL/uL — ABNORMAL LOW (ref 4.22–5.81)
RDW: 16.5 % — ABNORMAL HIGH (ref 11.5–15.5)
WBC: 18.6 10*3/uL — ABNORMAL HIGH (ref 4.0–10.5)

## 2017-07-15 LAB — MAGNESIUM: Magnesium: 2.6 mg/dL — ABNORMAL HIGH (ref 1.7–2.4)

## 2017-07-15 LAB — RENAL FUNCTION PANEL
Albumin: 1.7 g/dL — ABNORMAL LOW (ref 3.5–5.0)
Anion gap: 10 (ref 5–15)
BUN: 29 mg/dL — ABNORMAL HIGH (ref 6–20)
CALCIUM: 8.2 mg/dL — AB (ref 8.9–10.3)
CO2: 24 mmol/L (ref 22–32)
CREATININE: 1.34 mg/dL — AB (ref 0.61–1.24)
Chloride: 98 mmol/L — ABNORMAL LOW (ref 101–111)
GFR, EST NON AFRICAN AMERICAN: 55 mL/min — AB (ref >60)
Glucose, Bld: 215 mg/dL — ABNORMAL HIGH (ref 65–99)
PHOSPHORUS: 2.8 mg/dL (ref 2.5–4.6)
Potassium: 4.6 mmol/L (ref 3.5–5.1)
SODIUM: 132 mmol/L — AB (ref 135–145)

## 2017-07-15 LAB — BASIC METABOLIC PANEL
Anion gap: 8 (ref 5–15)
BUN: 31 mg/dL — AB (ref 6–20)
CHLORIDE: 99 mmol/L — AB (ref 101–111)
CO2: 25 mmol/L (ref 22–32)
CREATININE: 1.36 mg/dL — AB (ref 0.61–1.24)
Calcium: 8.1 mg/dL — ABNORMAL LOW (ref 8.9–10.3)
GFR calc Af Amer: >60 mL/min (ref >60)
GFR, EST NON AFRICAN AMERICAN: 54 mL/min — AB (ref >60)
GLUCOSE: 159 mg/dL — AB (ref 65–99)
Potassium: 4.3 mmol/L (ref 3.5–5.1)
SODIUM: 132 mmol/L — AB (ref 135–145)

## 2017-07-15 LAB — HEPATIC FUNCTION PANEL
ALK PHOS: 147 U/L — AB (ref 38–126)
ALT: 7 U/L — ABNORMAL LOW (ref 17–63)
AST: 40 U/L (ref 15–41)
Albumin: 1.7 g/dL — ABNORMAL LOW (ref 3.5–5.0)
BILIRUBIN INDIRECT: 1.5 mg/dL — AB (ref 0.3–0.9)
BILIRUBIN TOTAL: 3.6 mg/dL — AB (ref 0.3–1.2)
Bilirubin, Direct: 2.1 mg/dL — ABNORMAL HIGH (ref 0.1–0.5)
Total Protein: 6.5 g/dL (ref 6.5–8.1)

## 2017-07-15 LAB — COOXEMETRY PANEL
CARBOXYHEMOGLOBIN: 2.2 % — AB (ref 0.5–1.5)
Methemoglobin: 0.8 % (ref 0.0–1.5)
O2 SAT: 61.6 %
TOTAL HEMOGLOBIN: 11.6 g/dL — AB (ref 12.0–16.0)

## 2017-07-15 LAB — GLUCOSE, CAPILLARY
GLUCOSE-CAPILLARY: 169 mg/dL — AB (ref 65–99)
Glucose-Capillary: 197 mg/dL — ABNORMAL HIGH (ref 65–99)
Glucose-Capillary: 236 mg/dL — ABNORMAL HIGH (ref 65–99)

## 2017-07-15 LAB — PROTIME-INR
INR: 5.14
Prothrombin Time: 48.9 seconds — ABNORMAL HIGH (ref 11.4–15.2)

## 2017-07-15 LAB — PHOSPHORUS: PHOSPHORUS: 2.4 mg/dL — AB (ref 2.5–4.6)

## 2017-07-15 NOTE — Evaluation (Signed)
Occupational Therapy Evaluation Patient Details Name: [name] MRN: [id number] DOB: July 26, 1956 Today's Date: 07/15/2017    History of Present Illness 61 yr old male with extensive PMHx MI, ICM, HFrEF EF15%-20% ,BiVICD, CKD Stage IV, DM, GERD, OSA on CPAP, PAF, presents with weight gain, lower extremity swelling and decreased urine output within the last week.   Work up included AOCsHF/cardiogenic shock, BiV MSSA, demand ischemia.  Pt continues on CRRT.   Clinical Impression   Pt admitted with above. He demonstrates the below listed deficits and will benefit from continued OT to maximize safety and independence with BADLs.  Pt presents to OT with generalized weakness, decreased activity tolerance, decreased balance.  He requires max - total A for ADLs, but is very motivated and should progress well with therapies.  Family is very supportive.  Recommend CIR.       Follow Up Recommendations  CIR;Supervision/Assistance - 24 hour    Equipment Recommendations  3 in 1 bedside commode;Tub/shower bench    Recommendations for Other Services Rehab consult     Precautions / Restrictions Precautions Precautions: Fall Precaution Comments: Watch BP      Mobility Bed Mobility Overal bed mobility: Needs Assistance Bed Mobility: Rolling;Sidelying to Sit Rolling: Mod assist Sidelying to sit: Mod assist;+2 for safety/equipment       General bed mobility comments: cues and sequencing, truncal assist to roll and come up to sitting  Transfers Overall transfer level: Needs assistance   Transfers: Sit to/from Stand;Stand Pivot Transfers Sit to Stand: Mod assist Stand pivot transfers: Mod assist;+2 safety/equipment       General transfer comment: Cues for hand placement.  Assist to both come forward and boost up.  Stability assist/support for pivot transfer    Balance Overall balance assessment: Needs assistance Sitting-balance support: Bilateral upper extremity supported;Feet  supported Sitting balance-Leahy Scale: Poor Sitting balance - Comments: Sat EOB 10 min.  Initially listing posteriorly, but as he continued at EOB able to eventually maintain sitting with bil UE's. Postural control: Posterior lean   Standing balance-Leahy Scale: Poor Standing balance comment: needing external support                           ADL either performed or assessed with clinical judgement   ADL Overall ADL's : Needs assistance/impaired Eating/Feeding: Minimal assistance;Sitting   Grooming: Wash/dry hands;Wash/dry face;Oral care;Brushing hair;Minimal assistance;Sitting   Upper Body Bathing: Maximal assistance;Bed level;Sitting   Lower Body Bathing: Total assistance;Sit to/from stand   Upper Body Dressing : Sitting;Total assistance   Lower Body Dressing: Total assistance;Sit to/from stand   Toilet Transfer: Moderate assistance;+2 for physical assistance;+2 for safety/equipment;Stand-pivot   Toileting- Clothing Manipulation and Hygiene: Total assistance;Sit to/from stand       Functional mobility during ADLs: Moderate assistance;+2 for physical assistance;+2 for safety/equipment       Vision         Perception     Praxis      Pertinent Vitals/Pain Pain Assessment: Faces Faces Pain Scale: Hurts little more Pain Location: R leg/groin/hip pain Pain Descriptors / Indicators: Aching;Grimacing;Guarding;Sore Pain Intervention(s): Monitored during session     Hand Dominance     Extremity/Trunk Assessment Upper Extremity Assessment Upper Extremity Assessment: Generalized weakness   Lower Extremity Assessment Lower Extremity Assessment: Generalized weakness;[name] deficits/detail;LLE deficits/detail [name] Deficits / Details: R LE stiffer and more painful than L LE [name] Coordination: decreased fine motor LLE Deficits / Details: more mobile and stronger at 3+; less  stiff and painful than R LE LLE Coordination: decreased fine motor   Cervical / Trunk  Assessment Cervical / Trunk Assessment: Normal   Communication Communication Communication: No difficulties   Cognition Arousal/Alertness: Awake/alert Behavior During Therapy: Flat affect;WFL for tasks assessed/performed Overall Cognitive Status: Impaired/Different from baseline Area of Impairment: Following commands;Problem solving;Awareness                       Following Commands: Follows one step commands consistently   Awareness: Emergent Problem Solving: Slow processing     General Comments  BP's soft in sitting at 97/49 and report of dizziness, but wishing to continue.  RN present throughout entire session due to pt on CRRT     Exercises Exercises:  (warm up LE exercise prior to mobility)   Shoulder Instructions      Home Living Family/patient expects to be discharged to:: Private residence Living Arrangements: Spouse/significant other Available Help at Discharge: Family;Available PRN/intermittently Type of Home: House Home Access: Stairs to enter Entrance Stairs-Number of Steps: 1   Home Layout: One level     Bathroom Shower/Tub: Tub/shower unit;Walk-in shower   Bathroom Toilet: Standard     Home Equipment: Environmental consultant - 4 wheels;Walker - standard;Tub bench (may be able to find more of "Mom's" equipment)          Prior Functioning/Environment Level of Independence: Independent                 OT Problem List: Decreased strength;Decreased activity tolerance;Impaired balance (sitting and/or standing);Decreased safety awareness;Decreased knowledge of use of DME or AE;Cardiopulmonary status limiting activity      OT Treatment/Interventions: Self-care/ADL training;Neuromuscular education;DME and/or AE instruction;Therapeutic activities;Patient/family education;Balance training;Energy conservation;Therapeutic exercise    OT Goals(Current goals can be found in the care plan section) Acute Rehab OT Goals Patient Stated Goal: Back able to do for  myself OT Goal Formulation: With patient Time For Goal Achievement: 07/29/17 Potential to Achieve Goals: Good  OT Frequency: Min 2X/week   Barriers to D/C:            Co-evaluation PT/OT/SLP Co-Evaluation/Treatment: Yes Reason for Co-Treatment: Complexity of the patient's impairments (multi-system involvement);For patient/therapist safety   OT goals addressed during session: Strengthening/ROM      AM-PAC PT "6 Clicks" Daily Activity     Outcome Measure Help from another person eating meals?: A Little Help from another person taking care of personal grooming?: A Little Help from another person toileting, which includes using toliet, bedpan, or urinal?: A Lot Help from another person bathing (including washing, rinsing, drying)?: A Lot Help from another person to put on and taking off regular upper body clothing?: Total Help from another person to put on and taking off regular lower body clothing?: Total 6 Click Score: 12   End of Session Nurse Communication: Mobility status  Activity Tolerance: Patient limited by fatigue;Other (comment) (lines/equipment - pt on CRRT ) Patient left: in chair;with call bell/phone within reach  OT Visit Diagnosis: Unsteadiness on feet (R26.81);Muscle weakness (generalized) (M62.81)                Time: 2706-2376 OT Time Calculation (min): 26 min Charges:  OT General Charges $OT Visit: 1 Procedure OT Evaluation $OT Eval High Complexity: 1 Procedure G-Codes:     Lucille Passy, OTR/L [phone]   Lucille Passy M 07/15/2017, 5:42 PM

## 2017-07-15 NOTE — Progress Notes (Signed)
[facility]  Date of Admission:  07/09/2017   Total days of antibiotics 6        Day 6 Cefazolin         ASSESSMENT and PLAN:  MSSA Bacteremia  -2/2 Blood culture and biofire both resulted for MSSA (8/7) -2/2 Blood culture showed no growth for 4 days (8/9) -TEE can be considered once patient stabilizes. He will also need AICD to be replaced.  -Continue cefazolin  -Provide catheter holiday whenever possible  Right Hip Pain Possibly due to gout flare, continue colchicine. The patient stated that he did not have any pain today. He was also able to be moved from his bed to recliner.  -order MRI to evaluate hip    . Chlorhexidine Gluconate Cloth  6 each Topical Daily  . colchicine  0.3 mg Oral Daily  . insulin aspart  0-5 Units Subcutaneous QHS  . insulin aspart  0-9 Units Subcutaneous TID WC  . insulin aspart  3 Units Subcutaneous TID WC  . predniSONE  20 mg Oral Q breakfast  . sodium chloride flush  10-40 mL Intracatheter Q12H    SUBJECTIVE: Mr. Lave was seen sitting in a recliner this afternoon. He appeaed more lethargic and somnolent that usual today. He denied any sob, chest pain or headaches. He was with his son and grandson at bedside.   Review of Systems: ROS as above  No Known Allergies  OBJECTIVE: Vitals:   07/15/17 0730 07/15/17 0745 07/15/17 0800 07/15/17 0803  BP: (!) 94/51 (!) 104/53 (!) 103/46 (!) 103/46  Pulse: 72 73 73 77  Resp: 11 13 12 12   Temp:    98.2 F (36.8 C)  TempSrc:    Oral  SpO2: 95% 96% 95% 97%  Weight:      Height:       Body mass index is 26.57 kg/m.  Physical Exam  Constitutional: He appears lethargic. He has a sickly appearance.  HENT:  Head: Normocephalic and atraumatic.  Cardiovascular: Normal rate, regular rhythm and normal heart sounds.   Pulmonary/Chest: Effort normal and breath sounds normal. No respiratory distress. He has no wheezes.  Abdominal: He exhibits distension. There is  no tenderness.  Neurological: He appears lethargic.    Lab Results Lab Results  Component Value Date   WBC 18.6 (H) 07/15/2017   HGB 8.3 (L) 07/15/2017   HCT 26.1 (L) 07/15/2017   MCV 92.2 07/15/2017   PLT 149 (L) 07/15/2017    Lab Results  Component Value Date   CREATININE 1.36 (H) 07/15/2017   BUN 31 (H) 07/15/2017   NA 132 (L) 07/15/2017   K 4.3 07/15/2017   CL 99 (L) 07/15/2017   CO2 25 07/15/2017    Lab Results  Component Value Date   ALT 7 (L) 07/15/2017   AST 40 07/15/2017   ALKPHOS 147 (H) 07/15/2017   BILITOT 3.6 (H) 07/15/2017     Microbiology: Recent Results (from the past 240 hour(s))  Blood culture (routine x 2)     Status: Abnormal   Collection Time: 07/09/17 10:04 PM  Result Value Ref Range Status   Specimen Description BLOOD LEFT HAND  Final   Special Requests IN PEDIATRIC BOTTLE Blood Culture adequate volume  Final   Culture  Setup Time   Final    GRAM POSITIVE COCCI IN CLUSTERS IN PEDIATRIC BOTTLE CRITICAL RESULT CALLED TO, READ BACK BY AND VERIFIED WITH: R RUMBARGER,PHARMD AT 7124 07/10/17  BY L BENFIELD    Culture (A)  Final    STAPHYLOCOCCUS AUREUS SUSCEPTIBILITIES PERFORMED ON PREVIOUS CULTURE WITHIN THE LAST 5 DAYS.    Report Status 07/12/2017 FINAL  Final  Blood Culture ID Panel (Reflexed)     Status: Abnormal   Collection Time: 07/09/17 10:04 PM  Result Value Ref Range Status   Enterococcus species NOT DETECTED NOT DETECTED Final   Listeria monocytogenes NOT DETECTED NOT DETECTED Final   Staphylococcus species DETECTED (A) NOT DETECTED Final    Comment: CRITICAL RESULT CALLED TO, READ BACK BY AND VERIFIED WITH: R RUMBARGER,PHARMD AT 1613 07/10/17 BY L BENFIELD    Staphylococcus aureus DETECTED (A) NOT DETECTED Final    Comment: Methicillin (oxacillin) susceptible Staphylococcus aureus (MSSA). Preferred therapy is anti staphylococcal beta lactam antibiotic (Cefazolin or Nafcillin), unless clinically contraindicated. CRITICAL RESULT CALLED  TO, READ BACK BY AND VERIFIED WITH: R RUMBARGER,PHARMD AT 1613 07/10/17 BY L BENFIELD    Methicillin resistance NOT DETECTED NOT DETECTED Final   Streptococcus species NOT DETECTED NOT DETECTED Final   Streptococcus agalactiae NOT DETECTED NOT DETECTED Final   Streptococcus pneumoniae NOT DETECTED NOT DETECTED Final   Streptococcus pyogenes NOT DETECTED NOT DETECTED Final   Acinetobacter baumannii NOT DETECTED NOT DETECTED Final   Enterobacteriaceae species NOT DETECTED NOT DETECTED Final   Enterobacter cloacae complex NOT DETECTED NOT DETECTED Final   Escherichia coli NOT DETECTED NOT DETECTED Final   Klebsiella oxytoca NOT DETECTED NOT DETECTED Final   Klebsiella pneumoniae NOT DETECTED NOT DETECTED Final   Proteus species NOT DETECTED NOT DETECTED Final   Serratia marcescens NOT DETECTED NOT DETECTED Final   Haemophilus influenzae NOT DETECTED NOT DETECTED Final   Neisseria meningitidis NOT DETECTED NOT DETECTED Final   Pseudomonas aeruginosa NOT DETECTED NOT DETECTED Final   Candida albicans NOT DETECTED NOT DETECTED Final   Candida glabrata NOT DETECTED NOT DETECTED Final   Candida krusei NOT DETECTED NOT DETECTED Final   Candida parapsilosis NOT DETECTED NOT DETECTED Final   Candida tropicalis NOT DETECTED NOT DETECTED Final  Blood culture (routine x 2)     Status: Abnormal   Collection Time: 07/09/17 10:08 PM  Result Value Ref Range Status   Specimen Description BLOOD LEFT FOREARM  Final   Special Requests   Final    BOTTLES DRAWN AEROBIC AND ANAEROBIC Blood Culture adequate volume   Culture  Setup Time   Final    GRAM POSITIVE COCCI IN CLUSTERS IN BOTH AEROBIC AND ANAEROBIC BOTTLES CRITICAL RESULT CALLED TO, READ BACK BY AND VERIFIED WITH: R RUMBARGER,PHARMD AT 1613 07/10/17 BY L BENFIELD    Culture STAPHYLOCOCCUS AUREUS (A)  Final   Report Status 07/12/2017 FINAL  Final   Organism ID, Bacteria STAPHYLOCOCCUS AUREUS  Final      Susceptibility   Staphylococcus aureus - MIC*      CIPROFLOXACIN <=0.5 SENSITIVE Sensitive     ERYTHROMYCIN <=0.25 SENSITIVE Sensitive     GENTAMICIN <=0.5 SENSITIVE Sensitive     OXACILLIN 0.5 SENSITIVE Sensitive     TETRACYCLINE <=1 SENSITIVE Sensitive     VANCOMYCIN <=0.5 SENSITIVE Sensitive     TRIMETH/SULFA <=10 SENSITIVE Sensitive     CLINDAMYCIN <=0.25 SENSITIVE Sensitive     RIFAMPIN <=0.5 SENSITIVE Sensitive     Inducible Clindamycin NEGATIVE Sensitive     * STAPHYLOCOCCUS AUREUS  Urine culture     Status: None   Collection Time: 07/09/17 10:15 PM  Result Value Ref Range Status   Specimen Description URINE, CATHETERIZED  Final   Special Requests NONE  Final   Culture NO GROWTH  Final   Report Status 07/11/2017 FINAL  Final  MRSA PCR Screening     Status: None   Collection Time: 07/10/17  2:48 AM  Result Value Ref Range Status   MRSA by PCR NEGATIVE NEGATIVE Final    Comment:        The GeneXpert MRSA Assay (FDA approved for NASAL specimens only), is one component of a comprehensive MRSA colonization surveillance program. It is not intended to diagnose MRSA infection nor to guide or monitor treatment for MRSA infections.   Culture, blood (Routine X 2) w Reflex to ID Panel     Status: None (Preliminary result)   Collection Time: 07/11/17  4:22 PM  Result Value Ref Range Status   Specimen Description BLOOD BLOOD LEFT HAND  Final   Special Requests   Final    BOTTLES DRAWN AEROBIC ONLY Blood Culture adequate volume   Culture NO GROWTH 3 DAYS  Final   Report Status PENDING  Incomplete  Culture, blood (Routine X 2) w Reflex to ID Panel     Status: None (Preliminary result)   Collection Time: 07/11/17  4:25 PM  Result Value Ref Range Status   Specimen Description BLOOD BLOOD LEFT HAND  Final   Special Requests   Final    BOTTLES DRAWN AEROBIC ONLY Blood Culture adequate volume   Culture NO GROWTH 3 DAYS  Final   Report Status PENDING  Incomplete    Lars Mage, MD [facility] [phone] pager   [phone] cell 07/15/2017, 8:06 AM

## 2017-07-15 NOTE — Progress Notes (Signed)
PULMONARY / CRITICAL CARE MEDICINE   Name: [name] MRN: [id number] DOB: 10-Oct-1956    ADMISSION DATE:  07/09/2017 CONSULTATION DATE:  07/10/2017  REFERRING MD:  Sarajane Jews  CHIEF COMPLAINT:  dyspnea  BRIEF 61 y/o male with a history of ischecmic cardiomyopathy s/p ICD and CKD was admitted on 8/4 with acute respiratory failure due to pulmonary edema.  Noted on 8/5 to have MSSA bacteremia.  He has been in shock requiring levophed.    STUDIES:  07/10/2017 Echo: LVEF 15-20%, severe TR, PA press 80's, no veg seen on valves  CULTURES: 8/4 blood > MSSA 8/6 blood >   ANTIBIOTICS: 8/6 cefazolin >  8/6 rifampin >  8/5 vanc >      LINES/TUBES: 8/5 RIJ HD cath >    EVENTS 8/6 - ID consult 8/9 - Notes some heel pain now R leg Remains on levophed   SUBJECTIVE/OVERNIGHT/INTERVAL HX 8/10-  Remains on cRRT. Levophed down to 6mcg. Coox 62% . On cefazolin and rifampin. Seen by renal. Afebrile but for Tmax 99 07/13/17    VITAL SIGNS: BP (!) 105/45   Pulse 76   Temp 98.2 F (36.8 C) (Oral)   Resp 18   Ht 5\' 10"  (1.778 m)   Wt 84 kg (185 lb 3 oz)   SpO2 95%   BMI 26.57 kg/m   HEMODYNAMICS: CVP:  [11 mmHg-14 mmHg] 13 mmHg  VENTILATOR SETTINGS:    INTAKE / OUTPUT: I/O last 3 completed shifts: In: 1780.5 [P.O.:1120; I.V.:360.5; IV Piggyback:300] Out: 5730 [Urine:395; Other:5335]  PHYSICAL EXAMINATION:  General Appearance:    Looks deconditioned. In bed.   Head:    Normocephalic, without obvious abnormality, atraumatic  Eyes:    PERRL - yes, conjunctiva/corneas - clear      Ears:    Normal external ear canals, both ears  Nose:   NG tube - no  Throat:  ETT TUBE - no , OG tube - no  Neck:   Supple,  No enlargement/tenderness/nodules     Lungs:     Clear to auscultation bilaterally,   Chest wall:    No deformity  Heart:    S1 and S2 normal, no murmur, CVP - no.  Pressors - levophed. Paced  Abdomen:     Soft, no masses, no organomegaly  Genitalia:    Not done   Rectal:   not done  Extremities:   Extremities- moves them on command and intat     Skin:   Intact in exposed areas .      Neurologic:   Sedation - none -> RASS - awake and communicative . Moves all 4s - yes. CAM-ICU - neg for delirium . Orientation - x3+     PULMONARY  Recent Labs Lab 07/09/17 2212  07/11/17 0355 07/12/17 0426 07/13/17 0350 07/14/17 0420 07/15/17 0400  PHART  --   --  7.470*  --   --   --   --   PCO2ART  --   --  24.9*  --   --   --   --   PO2ART  --   --  60.0*  --   --   --   --   HCO3  --   --  18.2*  --   --   --   --   TCO2 20  --  19  --   --   --   --   O2SAT  --   < > 93.0 76.5 57.5 62.8  61.6  < > = values in this interval not displayed.  CBC  Recent Labs Lab 07/12/17 0424 07/13/17 0337 07/15/17 0350  HGB 7.9* 8.4* 8.3*  HCT 24.2* 26.1* 26.1*  WBC 29.9* 24.0* 18.6*  PLT 172 187 149*    COAGULATION  Recent Labs Lab 07/11/17 0335 07/12/17 0424 07/13/17 0337 07/14/17 0416 07/15/17 0350  INR 2.21 3.49 5.42* 5.25* 5.14*    CARDIAC   Recent Labs Lab 07/10/17 0200 07/10/17 1115 07/10/17 1800  TROPONINI 0.11* 1.05* 41.84*   No results for input(s): PROBNP in the last 168 hours.   CHEMISTRY  Recent Labs Lab 07/11/17 0335  07/12/17 0426  07/13/17 1700 07/13/17 0341 07/13/17 1707 07/14/17 0416 07/14/17 1542 07/15/17 0350  NA 124*  < > 126*  < >  --  128* 129* 132* 131* 132*  K 3.7  < > 3.5  < >  --  4.1 4.2 4.3 4.0 4.3  CL 90*  < > 90*  < >  --  94* 94* 97* 98* 99*  CO2 19*  < > 21*  < >  --  23 23 24 24 25   GLUCOSE 263*  < > 252*  < >  --  185* 198* 182* 152* 159*  BUN 109*  < > 90*  < >  --  56* 47* 39* 37* 31*  CREATININE 3.69*  < > 2.74*  < >  --  1.90* 1.57* 1.45* 1.36* 1.36*  CALCIUM 8.6*  < > 8.2*  < >  --  8.4* 8.4* 8.3* 8.3* 8.1*  MG 2.2  --  2.5*  --  2.4  --   --  2.5*  --  2.6*  PHOS  --   --  4.1  < >  --  2.8 2.8 2.7 2.5 2.4*  < > = values in this interval not displayed. Estimated Creatinine  Clearance: 58.1 mL/min (A) (by C-G formula based on SCr of 1.36 mg/dL (H)).   LIVER  Recent Labs Lab 07/09/17 2132 07/10/17 1115 07/11/17 0335 07/12/17 0424  07/12/17 1110  07/13/17 0337  07/13/17 0825 07/13/17 1707 07/14/17 0416 07/14/17 1542 07/15/17 0350  AST 45* 47*  --   --   --  34  --   --   --  40  --   --   --  40  ALT 14* 16*  --   --   --  8*  --   --   --  7*  --   --   --  7*  ALKPHOS 98 96  --   --   --  126  --   --   --  143*  --   --   --  147*  BILITOT 2.5* 2.8*  --   --   --  4.7*  --   --   --  4.8*  --   --   --  3.6*  PROT 6.4* 6.4*  --   --   --  6.2*  --   --   --  6.3*  --   --   --  6.5  ALBUMIN 2.2* 2.1*  --   --   < > 2.0*  < >  --   < > 1.9* 1.8* 1.9* 1.8* 1.7*  INR 1.89 2.12 2.21 3.49  --   --   --  5.42*  --   --   --  5.25*  --  5.14*  < > =  values in this interval not displayed.   INFECTIOUS  Recent Labs Lab 07/10/17 0427 07/10/17 1115 07/11/17 0335 07/12/17 0426  LATICACIDVEN 4.7* 3.9* 3.2*  --   PROCALCITON  --  3.96 3.97 2.78     ENDOCRINE CBG (last 3)   Recent Labs  07/14/17 1640 07/14/17 2146 07/15/17 0805  GLUCAP 135* 149* 169*         IMAGING x48h  - image(s) personally visualized  -   highlighted in bold No results found.   DISCUSSION: 61 y/o male with a past medical history significant for CAD, ischemic cardiomyopathy admitted with MSSA bacteremia and demand ischemia associated shock. Now with acute on chronic renal failure.    ASSESSMENT / PLAN:  PULMONARY A: Acute dyspnea/mild hypoxemia due to acute pulmonary edema OSA  07/15/2017 -maintains respiratory status  P:   Continue volume removal with CVVHD Continue to monitor O2 saturation CPAP qHS per home settings  CARDIOVASCULAR A:  Ischemic cardiomyopathy Shock: mixed picture septic and cardiogenic Elevated PA pressure, likely due to volume overload ICD in place with staph bacteremia Demand ischemia, troponin 40 Paroxysmal afib Known CAD    - circulatory shock continues. Seen by CHF team  P:  Continue levophed Continue volume removal with CVVHD TEE/Pacer wire disposition per cardiology/ID  RENAL A:   Acute on chronic renal failure R arm fistula in place   - CRRT continues P:   Per renal   GASTROINTESTINAL A:   History of colostomy/rectal cancer P:   Cardiac diet  HEMATOLOGIC A:   Anemia without bleeding Coagulopathy from sepsis/antibiotics/cirrhosis P:  Hold warfarin Monitor for bleeding  INFECTIOUS A:   MSSA bacteremia with ICD in place, HD cath in place (placed 8/5) R leg pain> source of staph? Has wound R leg;  P:   TEE per cardiology/ID Will need line holiday Continue antibiotics per ID  ENDOCRINE A:   Hyperglycemia P:   SSI  NEUROLOGIC A:   No acute issues P:   Monitor closely for delirium  ORTHO A: Achilles tendon pain, hip/knee pain> arthritic?   -07/15/2017 - no evidence of septic knee.. Denies pain in achilles P: Low dose prednisone today x1 dose Decide if he needs more based on today's response   FAMILY  - Updates: see notes from 8/7; wife updated daily including 07/15/2017       Dr. Brand Males, M.D., [facility].P Pulmonary and Critical Care Medicine Staff Physician [facility] Pager: [phone], If no answer or between  15:00h - 7:00h: call [phone]  07/15/2017 10:36 AM

## 2017-07-15 NOTE — Progress Notes (Signed)
Subjective: No new complaints   Antibiotics:  Anti-infectives    Start     Dose/Rate Route Frequency Ordered Stop   07/12/17 1600  rifampin (RIFADIN) capsule 300 mg  Status:  Discontinued     300 mg Oral Daily 07/12/17 1148 07/13/17 0929   07/10/17 2200  vancomycin (VANCOCIN) IVPB 750 mg/150 ml premix  Status:  Discontinued     750 mg 150 mL/hr over 60 Minutes Intravenous Every 24 hours 07/09/17 2321 07/10/17 1647   07/10/17 1800  rifampin (RIFADIN) 300 mg in sodium chloride 0.9 % 100 mL IVPB  Status:  Discontinued     300 mg 200 mL/hr over 30 Minutes Intravenous Every 8 hours 07/10/17 1647 07/12/17 1148   07/10/17 1800  ceFAZolin (ANCEF) IVPB 2g/100 mL premix     2 g 200 mL/hr over 30 Minutes Intravenous Every 12 hours 07/10/17 1647     07/10/17 0600  piperacillin-tazobactam (ZOSYN) IVPB 3.375 g  Status:  Discontinued     3.375 g 12.5 mL/hr over 240 Minutes Intravenous Every 8 hours 07/09/17 2321 07/10/17 1647   07/09/17 2230  vancomycin (VANCOCIN) IVPB 1000 mg/200 mL premix     1,000 mg 200 mL/hr over 60 Minutes Intravenous  Once 07/09/17 2220 07/09/17 2341   07/09/17 2230  piperacillin-tazobactam (ZOSYN) IVPB 3.375 g     3.375 g 100 mL/hr over 30 Minutes Intravenous  Once 07/09/17 2220 07/09/17 2311      Medications: Scheduled Meds: . Chlorhexidine Gluconate Cloth  6 each Topical Daily  . colchicine  0.3 mg Oral Daily  . insulin aspart  0-5 Units Subcutaneous QHS  . insulin aspart  0-9 Units Subcutaneous TID WC  . insulin aspart  3 Units Subcutaneous TID WC  . sodium chloride flush  10-40 mL Intracatheter Q12H   Continuous Infusions: . sodium chloride Stopped (07/15/17 1500)  .  ceFAZolin (ANCEF) IV Stopped (07/15/17 0762)  . heparin    . norepinephrine (LEVOPHED) Adult infusion 12 mcg/min (07/15/17 1500)  . dialysis replacement fluid (prismasate) 300 mL/hr at 07/15/17 0958  . dialysis replacement fluid (prismasate) 200 mL/hr at 07/15/17 0149  .  dialysate (PRISMASATE) 1,000 mL/hr at 07/15/17 1203   PRN Meds:.sodium chloride, acetaminophen, alum & mag hydroxide-simeth, fentaNYL (SUBLIMAZE) injection, heparin, heparin, heparin, HYDROcodone-acetaminophen, magic mouthwash w/lidocaine, ondansetron (ZOFRAN) IV, sodium chloride flush    Objective: Weight change: -3 lb 4.9 oz (-1.5 kg)  Intake/Output Summary (Last 24 hours) at 07/15/17 1600 Last data filed at 07/15/17 1500  Gross per 24 hour  Intake          1052.18 ml  Output             3765 ml  Net         -2712.82 ml   Blood pressure 116/64, pulse 76, temperature 97.6 F (36.4 C), temperature source Oral, resp. rate 13, height 5\' 10"  (1.778 m), weight 185 lb 3 oz (84 kg), SpO2 98 %. Temp:  [96.6 F (35.9 C)-98.2 F (36.8 C)] 97.6 F (36.4 C) (08/10 1216) Pulse Rate:  [69-80] 76 (08/10 1530) Resp:  [7-19] 13 (08/10 1530) BP: (90-120)/(41-76) 116/64 (08/10 1530) SpO2:  [90 %-100 %] 98 % (08/10 1530) Weight:  [185 lb 3 oz (84 kg)] 185 lb 3 oz (84 kg) (08/10 0500)  Physical Exam: General: Alert and awake, oriented x3, wearing "bear hugger" HEENT: anicteric sclera EOMI CVS regular rate, normal r Chest: no wheezing,resp distress Neuro: nonfocal  CBC:  CBC Latest Ref Rng & Units 07/15/2017 07/13/2017 07/12/2017  WBC 4.0 - 10.5 K/uL 18.6(H) 24.0(H) 29.9(H)  Hemoglobin 13.0 - 17.0 g/dL 8.3(L) 8.4(L) 7.9(L)  Hematocrit 39.0 - 52.0 % 26.1(L) 26.1(L) 24.2(L)  Platelets 150 - 400 K/uL 149(L) 187 172     BMET  Recent Labs  07/14/17 1542 07/15/17 0350  NA 131* 132*  K 4.0 4.3  CL 98* 99*  CO2 24 25  GLUCOSE 152* 159*  BUN 37* 31*  CREATININE 1.36* 1.36*  CALCIUM 8.3* 8.1*     Liver Panel   Recent Labs  07/13/17 0825  07/14/17 1542 07/15/17 0350  PROT 6.3*  --   --  6.5  ALBUMIN 1.9*  < > 1.8* 1.7*  AST 40  --   --  40  ALT 7*  --   --  7*  ALKPHOS 143*  --   --  147*  BILITOT 4.8*  --   --  3.6*  BILIDIR 3.0*  --   --  2.1*  IBILI 1.8*  --   --  1.5*  <  > = values in this interval not displayed.     Sedimentation Rate No results for input(s): ESRSEDRATE in the last 72 hours. C-Reactive Protein No results for input(s): CRP in the last 72 hours.  Micro Results: Recent Results (from the past 720 hour(s))  Blood culture (routine x 2)     Status: Abnormal   Collection Time: 07/09/17 10:04 PM  Result Value Ref Range Status   Specimen Description BLOOD LEFT HAND  Final   Special Requests IN PEDIATRIC BOTTLE Blood Culture adequate volume  Final   Culture  Setup Time   Final    GRAM POSITIVE COCCI IN CLUSTERS IN PEDIATRIC BOTTLE CRITICAL RESULT CALLED TO, READ BACK BY AND VERIFIED WITH: R RUMBARGER,PHARMD AT 1613 07/10/17 BY L BENFIELD    Culture (A)  Final    STAPHYLOCOCCUS AUREUS SUSCEPTIBILITIES PERFORMED ON PREVIOUS CULTURE WITHIN THE LAST 5 DAYS.    Report Status 07/12/2017 FINAL  Final  Blood Culture ID Panel (Reflexed)     Status: Abnormal   Collection Time: 07/09/17 10:04 PM  Result Value Ref Range Status   Enterococcus species NOT DETECTED NOT DETECTED Final   Listeria monocytogenes NOT DETECTED NOT DETECTED Final   Staphylococcus species DETECTED (A) NOT DETECTED Final    Comment: CRITICAL RESULT CALLED TO, READ BACK BY AND VERIFIED WITH: R RUMBARGER,PHARMD AT 1613 07/10/17 BY L BENFIELD    Staphylococcus aureus DETECTED (A) NOT DETECTED Final    Comment: Methicillin (oxacillin) susceptible Staphylococcus aureus (MSSA). Preferred therapy is anti staphylococcal beta lactam antibiotic (Cefazolin or Nafcillin), unless clinically contraindicated. CRITICAL RESULT CALLED TO, READ BACK BY AND VERIFIED WITH: R RUMBARGER,PHARMD AT 1613 07/10/17 BY L BENFIELD    Methicillin resistance NOT DETECTED NOT DETECTED Final   Streptococcus species NOT DETECTED NOT DETECTED Final   Streptococcus agalactiae NOT DETECTED NOT DETECTED Final   Streptococcus pneumoniae NOT DETECTED NOT DETECTED Final   Streptococcus pyogenes NOT DETECTED NOT  DETECTED Final   Acinetobacter baumannii NOT DETECTED NOT DETECTED Final   Enterobacteriaceae species NOT DETECTED NOT DETECTED Final   Enterobacter cloacae complex NOT DETECTED NOT DETECTED Final   Escherichia coli NOT DETECTED NOT DETECTED Final   Klebsiella oxytoca NOT DETECTED NOT DETECTED Final   Klebsiella pneumoniae NOT DETECTED NOT DETECTED Final   Proteus species NOT DETECTED NOT DETECTED Final   Serratia marcescens NOT DETECTED NOT DETECTED Final   Haemophilus  influenzae NOT DETECTED NOT DETECTED Final   Neisseria meningitidis NOT DETECTED NOT DETECTED Final   Pseudomonas aeruginosa NOT DETECTED NOT DETECTED Final   Candida albicans NOT DETECTED NOT DETECTED Final   Candida glabrata NOT DETECTED NOT DETECTED Final   Candida krusei NOT DETECTED NOT DETECTED Final   Candida parapsilosis NOT DETECTED NOT DETECTED Final   Candida tropicalis NOT DETECTED NOT DETECTED Final  Blood culture (routine x 2)     Status: Abnormal   Collection Time: 07/09/17 10:08 PM  Result Value Ref Range Status   Specimen Description BLOOD LEFT FOREARM  Final   Special Requests   Final    BOTTLES DRAWN AEROBIC AND ANAEROBIC Blood Culture adequate volume   Culture  Setup Time   Final    GRAM POSITIVE COCCI IN CLUSTERS IN BOTH AEROBIC AND ANAEROBIC BOTTLES CRITICAL RESULT CALLED TO, READ BACK BY AND VERIFIED WITH: R RUMBARGER,PHARMD AT 8099 07/10/17 BY L BENFIELD    Culture STAPHYLOCOCCUS AUREUS (A)  Final   Report Status 07/12/2017 FINAL  Final   Organism ID, Bacteria STAPHYLOCOCCUS AUREUS  Final      Susceptibility   Staphylococcus aureus - MIC*    CIPROFLOXACIN <=0.5 SENSITIVE Sensitive     ERYTHROMYCIN <=0.25 SENSITIVE Sensitive     GENTAMICIN <=0.5 SENSITIVE Sensitive     OXACILLIN 0.5 SENSITIVE Sensitive     TETRACYCLINE <=1 SENSITIVE Sensitive     VANCOMYCIN <=0.5 SENSITIVE Sensitive     TRIMETH/SULFA <=10 SENSITIVE Sensitive     CLINDAMYCIN <=0.25 SENSITIVE Sensitive     RIFAMPIN <=0.5  SENSITIVE Sensitive     Inducible Clindamycin NEGATIVE Sensitive     * STAPHYLOCOCCUS AUREUS  Urine culture     Status: None   Collection Time: 07/09/17 10:15 PM  Result Value Ref Range Status   Specimen Description URINE, CATHETERIZED  Final   Special Requests NONE  Final   Culture NO GROWTH  Final   Report Status 07/11/2017 FINAL  Final  MRSA PCR Screening     Status: None   Collection Time: 07/10/17  2:48 AM  Result Value Ref Range Status   MRSA by PCR NEGATIVE NEGATIVE Final    Comment:        The GeneXpert MRSA Assay (FDA approved for NASAL specimens only), is one component of a comprehensive MRSA colonization surveillance program. It is not intended to diagnose MRSA infection nor to guide or monitor treatment for MRSA infections.   Culture, blood (Routine X 2) w Reflex to ID Panel     Status: None (Preliminary result)   Collection Time: 07/11/17  4:22 PM  Result Value Ref Range Status   Specimen Description BLOOD BLOOD LEFT HAND  Final   Special Requests   Final    BOTTLES DRAWN AEROBIC ONLY Blood Culture adequate volume   Culture NO GROWTH 4 DAYS  Final   Report Status PENDING  Incomplete  Culture, blood (Routine X 2) w Reflex to ID Panel     Status: None (Preliminary result)   Collection Time: 07/11/17  4:25 PM  Result Value Ref Range Status   Specimen Description BLOOD BLOOD LEFT HAND  Final   Special Requests   Final    BOTTLES DRAWN AEROBIC ONLY Blood Culture adequate volume   Culture NO GROWTH 4 DAYS  Final   Report Status PENDING  Incomplete    Studies/Results: No results found.    Assessment/Plan:  INTERVAL HISTORY:  Still on CVVHD and pressors   Principal Problem:  Septic shock ([place]) Active Problems:   Ischemic cardiomyopathy-EF 35% 04/24/15 echo   Long term current use of anticoagulant therapy   CAD with LCX disease and stents to LAD   Chronic a-fib (HCC)   Chronic kidney disease, stage IV (severe) (HCC)   OSA on CPAP   Hyperlipidemia    BiV ICD (St Jude) gen change 04/10/15   Chronic atrial fibrillation (HCC)   Right heart failure (HCC)   Cardiorenal syndrome with renal failure   Infected defibrillator (HCC)   Septic hip (HCC)   MSSA (methicillin susceptible Staphylococcus aureus) infection   Staphylococcus aureus bacteremia with sepsis ([place])   Severe sepsis with septic shock ([place])    [name] is a 61 y.o. male with  With cardiac device, SAB and therefore device infection, HD catheter in place to provide CVVHD in context of cardiogenic/septic shock on pressors  I am waiting to see if he will improve but not very optimistic  IF he can get off CVVHD, go to conventional HD, then have "catheter holiday" that would help  He also needs a TEE and device extraction  I will follow peripherally for now   LOS: 5 days   [name] 07/15/2017, 4:00 PM

## 2017-07-15 NOTE — Evaluation (Signed)
Physical Therapy Evaluation Patient Details Name: [name] MRN: [id number] DOB: [birth year] Today's Date: 07/15/2017   History of Present Illness  61 yr old male with extensive PMHx MI, ICM, HFrEF EF15%-20% ,BiVICD, CKD Stage IV, DM, GERD, OSA on CPAP, PAF, presents with weight gain, lower extremity swelling and decreased urine output within the last week.   Work up included AOCsHF/cardiogenic shock, BiV MSSA, demand ischemia.  Pt continues on CRRT.  Clinical Impression  Pt admitted with/for septic shock and presently quite debilitated needing mod assist of two for basic mobility.  Pt currently limited functionally due to the problems listed. ( See problems list.)   Pt will benefit from PT to maximize function and safety in order to get ready for next venue listed below.     Follow Up Recommendations CIR;Supervision/Assistance - 24 hour    Equipment Recommendations  None recommended by PT;Other (comment) (TBA)    Recommendations for Other Services Rehab consult     Precautions / Restrictions Precautions Precautions: Fall Precaution Comments: Watch BP      Mobility  Bed Mobility Overal bed mobility: Needs Assistance Bed Mobility: Rolling;Sidelying to Sit Rolling: Mod assist Sidelying to sit: Mod assist;+2 for safety/equipment       General bed mobility comments: cues and sequencing, truncal assist to roll and come up to sitting  Transfers Overall transfer level: Needs assistance   Transfers: Sit to/from Stand;Stand Pivot Transfers Sit to Stand: Mod assist Stand pivot transfers: Mod assist;+2 safety/equipment       General transfer comment: Cues for hand placement.  Assist to both come forward and boost up.  Stability assist/support for pivot transfer  Ambulation/Gait             General Gait Details: not able today  Stairs            Wheelchair Mobility    Modified Rankin (Stroke Patients Only)       Balance Overall balance assessment:  Needs assistance Sitting-balance support: Bilateral upper extremity supported;Feet supported Sitting balance-Leahy Scale: Poor Sitting balance - Comments: Sat EOB 10 min.  Initially listing posteriorly, but as he continued at EOB able to eventually maintain sitting with bil UE's. Postural control: Posterior lean   Standing balance-Leahy Scale: Poor Standing balance comment: needing external support                             Pertinent Vitals/Pain Pain Assessment: Faces Faces Pain Scale: Hurts little more Pain Location: R leg/groin/hip pain Pain Descriptors / Indicators: Aching;Grimacing;Guarding;Sore Pain Intervention(s): Monitored during session    Home Living Family/patient expects to be discharged to:: Private residence Living Arrangements: Spouse/significant other Available Help at Discharge: Family;Available PRN/intermittently Type of Home: House Home Access: Stairs to enter   Entrance Stairs-Number of Steps: 1 Home Layout: One level Home Equipment: Walker - 4 wheels;Walker - standard;Tub bench (may be able to find more of "Mom's" equipment)      Prior Function Level of Independence: Independent               Hand Dominance        Extremity/Trunk Assessment   Upper Extremity Assessment Upper Extremity Assessment: Defer to OT evaluation    Lower Extremity Assessment Lower Extremity Assessment: Generalized weakness;[name] deficits/detail;LLE deficits/detail [name] Deficits / Details: R LE stiffer and more painful than L LE [name] Coordination: decreased fine motor LLE Deficits / Details: more mobile and stronger at 3+; less stiff and painful  than R LE LLE Coordination: decreased fine motor       Communication   Communication: No difficulties  Cognition Arousal/Alertness: Awake/alert Behavior During Therapy: Flat affect;WFL for tasks assessed/performed Overall Cognitive Status: Impaired/Different from baseline Area of Impairment: Following  commands;Problem solving;Awareness                       Following Commands: Follows one step commands consistently   Awareness: Emergent Problem Solving: Slow processing        General Comments General comments (skin integrity, edema, etc.): BP's soft in sitting at 97/49 and report of dizziness, but wishing to continue.    Exercises     Assessment/Plan    PT Assessment Patient needs continued PT services  PT Problem List Decreased strength;Decreased range of motion;Decreased activity tolerance;Decreased balance;Decreased mobility;Decreased coordination;Decreased knowledge of use of DME;Cardiopulmonary status limiting activity;Pain       PT Treatment Interventions Gait training;Functional mobility training;Therapeutic activities;Balance training;Patient/family education;DME instruction    PT Goals (Current goals can be found in the Care Plan section)  Acute Rehab PT Goals Patient Stated Goal: Back able to do for myself PT Goal Formulation: With patient Time For Goal Achievement: 07/29/17 Potential to Achieve Goals: Good    Frequency Min 3X/week   Barriers to discharge        Co-evaluation               AM-PAC PT "6 Clicks" Daily Activity  Outcome Measure Difficulty turning over in bed (including adjusting bedclothes, sheets and blankets)?: Total Difficulty moving from lying on back to sitting on the side of the bed? : Total Difficulty sitting down on and standing up from a chair with arms (e.g., wheelchair, bedside commode, etc,.)?: Total Help needed moving to and from a bed to chair (including a wheelchair)?: Total Help needed walking in hospital room?: Total Help needed climbing 3-5 steps with a railing? : Total 6 Click Score: 6    End of Session Equipment Utilized During Treatment: Oxygen Activity Tolerance: Patient tolerated treatment well;Treatment limited secondary to medical complications (Comment) Patient left: in chair;with call bell/phone  within reach;with family/visitor present Nurse Communication: Mobility status PT Visit Diagnosis: Other abnormalities of gait and mobility (R26.89);Muscle weakness (generalized) (M62.81);Pain Pain - Right/Left: Right Pain - part of body: Hip    Time: 1324-4010 PT Time Calculation (min) (ACUTE ONLY): 26 min   Charges:   PT Evaluation $PT Eval Moderate Complexity: 1 Mod     PT G Codes:        07/19/17  [place] Bing, PT [phone]  (pager)  Eliseo Gum Meliza Kage July 19, 2017, 5:19 PM

## 2017-07-15 NOTE — Progress Notes (Signed)
Patient ID: [name], male   DOB: 1956/06/08, 61 y.o.   MRN: [id number]     Advanced Heart Failure Rounding Note  Primary Cardiologist: Deangleo Passage  Subjective:    Admitted with fatigue, dyspnea, recurrent lower extremity swelling and decreased UOP.   Remains on CRRT and norepi down to 9 mcg. Pulling 100 per hour. Making 10-20 cc urine. Todays CO-OX is 62%.   Ongoing fatigue. Denies SOB. Appetite a little better today.     Blood CX- 2/2 MSSA ? From LLE wounds?Marland Kitchen On cefazolin and rifampin.   Objective:   Weight Range: 185 lb 3 oz (84 kg) Body mass index is 26.57 kg/m.   Vital Signs:   Temp:  [96.6 F (35.9 C)-98.2 F (36.8 C)] 98.2 F (36.8 C) (08/10 0803) Pulse Rate:  [68-109] 72 (08/10 0900) Resp:  [7-24] 13 (08/10 0900) BP: (80-120)/(41-76) 106/45 (08/10 0900) SpO2:  [90 %-100 %] 96 % (08/10 0900) Weight:  [185 lb 3 oz (84 kg)] 185 lb 3 oz (84 kg) (08/10 0500) Last BM Date: 07/13/17 (ostomy moved, bag changed)  Weight change: Filed Weights   07/13/17 0500 07/14/17 0500 07/15/17 0500  Weight: 196 lb 6.9 oz (89.1 kg) 188 lb 7.9 oz (85.5 kg) 185 lb 3 oz (84 kg)    Intake/Output:   Intake/Output Summary (Last 24 hours) at 07/15/17 0945 Last data filed at 07/15/17 0900  Gross per 24 hour  Intake          1076.88 ml  Output             3870 ml  Net         -2793.12 ml      Physical Exam  CVP 10 General:  Chronically ill . No resp difficulty.  HEENT: normal Neck: supple. JVP 9-10.  Carotids 2+ bilat; no bruits. No lymphadenopathy or thryomegaly appreciated. RIJ HD cath Cor: PMI nondisplaced. Irregular rate & rhythm. No rubs, gallops or murmurs. Lungs: Decreased in the bases.  Abdomen: soft, nontender, nondistended. No hepatosplenomegaly. No bruits or masses. Good bowel sounds.  Extremities: no cyanosis, clubbing, rash, R and LLE 3+ edema Neuro: alert & orientedx3, cranial nerves grossly intact. moves all 4 extremities w/o difficulty. Affect  pleasant   Telemetry  A fib V pacing 60-70s Personally reviewed.   Labs    CBC  Recent Labs  07/13/17 0337 07/15/17 0350  WBC 24.0* 18.6*  HGB 8.4* 8.3*  HCT 26.1* 26.1*  MCV 90.3 92.2  PLT 187 149*   Basic Metabolic Panel  Recent Labs  07/14/17 0416 07/14/17 1542 07/15/17 0350  NA 132* 131* 132*  K 4.3 4.0 4.3  CL 97* 98* 99*  CO2 24 24 25   GLUCOSE 182* 152* 159*  BUN 39* 37* 31*  CREATININE 1.45* 1.36* 1.36*  CALCIUM 8.3* 8.3* 8.1*  MG 2.5*  --  2.6*  PHOS 2.7 2.5 2.4*   Liver Function Tests  Recent Labs  07/13/17 0825  07/14/17 1542 07/15/17 0350  AST 40  --   --  40  ALT 7*  --   --  7*  ALKPHOS 143*  --   --  147*  BILITOT 4.8*  --   --  3.6*  PROT 6.3*  --   --  6.5  ALBUMIN 1.9*  < > 1.8* 1.7*  < > = values in this interval not displayed. No results for input(s): LIPASE, AMYLASE in the last 72 hours. Cardiac Enzymes No results for input(s): CKTOTAL, CKMB, CKMBINDEX,  TROPONINI in the last 72 hours.  BNP: BNP (last 3 results)  Recent Labs  06/13/17 1205 07/09/17 2151  BNP 1,151.0* 1,553.9*    ProBNP (last 3 results) No results for input(s): PROBNP in the last 8760 hours.   D-Dimer No results for input(s): DDIMER in the last 72 hours. Hemoglobin A1C No results for input(s): HGBA1C in the last 72 hours. Fasting Lipid Panel No results for input(s): CHOL, HDL, LDLCALC, TRIG, CHOLHDL, LDLDIRECT in the last 72 hours. Thyroid Function Tests No results for input(s): TSH, T4TOTAL, T3FREE, THYROIDAB in the last 72 hours.  Invalid input(s): FREET3  Other results:   Imaging    No results found.   Medications:     Scheduled Medications: . Chlorhexidine Gluconate Cloth  6 each Topical Daily  . colchicine  0.3 mg Oral Daily  . insulin aspart  0-5 Units Subcutaneous QHS  . insulin aspart  0-9 Units Subcutaneous TID WC  . insulin aspart  3 Units Subcutaneous TID WC  . sodium chloride flush  10-40 mL Intracatheter Q12H     Infusions: . sodium chloride 250 mL (07/15/17 0715)  .  ceFAZolin (ANCEF) IV Stopped (07/15/17 2536)  . heparin    . norepinephrine (LEVOPHED) Adult infusion 9 mcg/min (07/15/17 0715)  . dialysis replacement fluid (prismasate) 300 mL/hr at 07/15/17 0146  . dialysis replacement fluid (prismasate) 200 mL/hr at 07/15/17 0149  . dialysate (PRISMASATE) 1,000 mL/hr at 07/15/17 6440    PRN Medications: sodium chloride, acetaminophen, alum & mag hydroxide-simeth, fentaNYL (SUBLIMAZE) injection, heparin, heparin, heparin, HYDROcodone-acetaminophen, magic mouthwash w/lidocaine, ondansetron (ZOFRAN) IV, sodium chloride flush    Patient Profile   [name] is a 61 year old with a history of DMII, SAH 2016, rectal cancer, CKD Stage IV, chronic A fib, S/P AVN ablation, PAF, CAD BMS LAD 2011 BMS LAD 2009, OSA, chronic systolic heart failure, [employer] Jude BiV ICD admitted with AKI and shock with elevated lactate, suspect cardiogenic.   Assessment/Plan   1. Acute on chronic systolic CHF/cardiogenic shock: Last echo in 8/17 with EF 15-20%.  Ischemic cardiomyopathy, has St Jude BiV ICD (despite chronic afib appears to be effectively BiV pacing).  Had recent admission (discharged 06/21/17) requiring milrinone support and diuresis.  Admitted with soft BP (SBP 80s-90s) + elevated lactate.  Some concern initially for PNA/septic shock, but suspect CXR shows R effusion rather than PNA, no fever.   Remains on CRRT and continue norepi 11 mcg.  Todays 62%.  2. AKI on CKD stage 4: Baseline creatinine around 2.5.  He required brief HD in the past, has RUE fistula. Started CRRT 8/6. Nephrology appreciated.  3. Atrial fibrillation: Chronic.  On warfarin, INR 5.4   He has been off coumadin for 4 days.  4. CAD: No CP. Hold statin for now. Hold aspirin.   5. ID: WBC trending down 29>24>18.6.  Covering with vanc/Zosyn for PNA given initial concern for septic shock, but suspect R-sided pleural effusion and cardiogenic shock.  Blood CX 2/2 MSSA. Antibiotics narrowed rifampin and ancef. Has BiV.  EP consulted. Will eventually need extraction.  If he needs extraction, Dr Ladona Ridgel is on vacation for the next 10 days so he would need transfer [facility] . TEE soon.  6. H/O Rectal CA with diverting colostomy  7. Type II diabetes: Sliding scale.  8. Liver failure - LFTs improving  Statin stopped.  Have discussed with PharmD about possible need to decrease rifam  Length of Stay: 5  Amy Clegg, NP  07/15/2017, 9:45  AM  Advanced Heart Failure Team Pager [phone] (M-F; 7a - 4p)  Please contact [facility] for night-coverage after hours (4p -7a ) and weekends on [email or web address]  Agree with above.  Remains extremely tenuous in setting of combined cardiogenic and septic shock with MSSA bacteremia with unclear source. On CVVHD pulling 100/hr. Volume status improving but still overloaded with 2-3+ thigh edema. Likely has 2 more days to pull. BP very soft. Continue NE. Can titrate as needed. Urine output sluggish. Liver failure improving slowly. Still with hip pain but CT negative for osteo. Norco dosing adjusted to limit tylenol exposure in setting of acute liver dysfunction.   On exam  fatigued appearing. Less jaundiced JVP up RIJ trialysis Cor RRR Lungs Crackles Ab distended. LLQ colostomy Ext: 3+ thigh edema  Continue CVVHD with pressor support as above. Continue ancef/rifampin. If continue to improve plan TEE on Monday. Will not be candidate for long-term HD. If renal function improves will need to consider device extraction versus suppressive abx therapy.(He is device dependent with h/o AVN ablation and CRT).   CRITICAL CARE Performed by: Arvilla Meres  Total critical care time: 35 minutes  Critical care time was exclusive of separately billable procedures and treating other patients.  Critical care was necessary to treat or prevent imminent or life-threatening deterioration.  Critical care was time spent personally  by me (independent of midlevel providers or residents) on the following activities: development of treatment plan with patient and/or surrogate as well as nursing, discussions with consultants, evaluation of patient's response to treatment, examination of patient, obtaining history from patient or surrogate, ordering and performing treatments and interventions, ordering and review of laboratory studies, ordering and review of radiographic studies, pulse oximetry and re-evaluation of patient's condition.  Arvilla Meres, MD  10:59 PM

## 2017-07-15 NOTE — Progress Notes (Signed)
Micheil C Scheidegger Bethany Molt, DO Length of Stay: 5 [facility] Rounding Note  SUBJECTIVE:  Brief HPI: 61 y/o M w/ sCHF s/p AICD placement, advanced CKD now likely ESRD with RUE AVF placement, cirrhosis and history of rectal cancer s/p resection and colostomy placement admitted 07/09/17 with respiratory failure, demand ischemia and volume overload. Presented in mixed cardiogenic shock (MSSA bacteremia). UOP trailed off despite lasix drip and was started on CRRT 8/6. Recently admitted 7/9-7/17 for volume overload and was diuresed with IV lasix, metolazone and milrinone until day of discharge when he was sent home with home dose of torsemide. Developed SOB, worsening edema and has gained nearly 30 lbs since discharge. He sees Dr. Hinda Lenis as an outpatient and required HD last year from July until November 2017 for AKI in CKD 2/2 ischemic ATN.   Interval events:  Seen by EP who echo dismal situation.  Should patient improve and become more stable, he would need TEE + transfer to extraction center for ICD removal.  Repeat Bcx from 8/6 NGTD Net out 2.7 L yesterday with 238ms recorded UP.  Continuing to pull 100-2085m/hr on CRRT.  Weight 185, was 198 yesterday.  Remains on levophed 9.4 mcgs/min.  INR 5.1  OBJECTIVE  Intake/Output Summary (Last 24 hours) at 07/15/17 0808 Last data filed at 07/15/17 0800  Gross per 24 hour  Intake          1156.78 ml  Output             3893 ml  Net         -2736.22 ml   Filed Weights   07/13/17 0500 07/14/17 0500 07/15/17 0500  Weight: 196 lb 6.9 oz (89.1 kg) 188 lb 7.9 oz (85.5 kg) 185 lb 3 oz (84 kg)   Physical Exam   Blood pressure (!) 103/46, pulse 77, temperature 98.2 F (36.8 C), temperature source Oral, resp. rate 12, height _0  (1.778 m), weight 185 lb 3 oz (84 kg), SpO2 97 %. GEN: Chronically-ill appearing caucasian male resting in bed. Pt continues to appear weak and fatigued. Generalized anasarca with pitting edema, improved but still prevelent.  Jaundiced.  HENT: [place], AT. Scleral icterus CV: RRR. Right IJ temp HD cath (placed 8/5). L sided ICD nontender. PULM: BL bibasilar crackles. Sat well on RA.   ABD: Protuberant with pitting edema. +Colostomy EXT: RUE fistula. No surrounding erythema or warmth. Woody changes BL LE c/w chronic edema.   Current Medications Scheduled Meds: . Chlorhexidine Gluconate Cloth  6 each Topical Daily  . colchicine  0.3 mg Oral Daily  . insulin aspart  0-5 Units Subcutaneous QHS  . insulin aspart  0-9 Units Subcutaneous TID WC  . insulin aspart  3 Units Subcutaneous TID WC  . predniSONE  20 mg Oral Q breakfast  . sodium chloride flush  10-40 mL Intracatheter Q12H   Continuous Infusions: . sodium chloride 250 mL (07/15/17 0715)  .  ceFAZolin (ANCEF) IV 2 g (07/15/17 066659 . heparin    . norepinephrine (LEVOPHED) Adult infusion 9 mcg/min (07/15/17 0715)  . dialysis replacement fluid (prismasate) 300 mL/hr at 07/15/17 0146  . dialysis replacement fluid (prismasate) 200 mL/hr at 07/15/17 0149  . dialysate (PRISMASATE) 1,000 mL/hr at 07/15/17 0628    Recent Labs Lab 07/09/17 2132  07/10/17 1115  07/12/17 0424 07/13/17 0337 07/15/17 0350  WBC 18.1*  --  28.0*  < > 29.9* 24.0* 18.6*  NEUTROABS 16.8*  --  26.0*  --  28.1*  --   --  HGB 8.2*  < > 8.3*  < > 7.9* 8.4* 8.3*  HCT 24.9*  < > 25.2*  < > 24.2* 26.1* 26.1*  MCV 92.6  --  92.6  < > 89.3 90.3 92.2  PLT 158  --  175  < > 172 187 149*  < > = values in this interval not displayed.  Recent Labs Lab 07/12/17 0426  07/12/17 1110 07/12/17 1600 07/13/17 0341 07/13/17 1707 07/14/17 0416 07/14/17 1542 07/15/17 0350  NA 126*  --  125* 128* 128* 129* 132* 131* 132*  K 3.5  --  3.7 3.8 4.1 4.2 4.3 4.0 4.3  CL 90*  --  92* 94* 94* 94* 97* 98* 99*  CO2 21*  --  19* 21* _0 GLUCOSE 252*  --  252* 206* 185* 198* 182* 152* 159*  BUN 90*  --  77* 70* 56* 47* 39* 37* 31*  CREATININE 2.74*  --  2.47* 2.10* 1.90* 1.57* 1.45* 1.36*  1.36*  CALCIUM 8.2*  < > 8.2* 8.2* 8.4* 8.4* 8.3* 8.3* 8.1*  PHOS 4.1  --   --  3.2 2.8 2.8 2.7 2.5 2.4*  < > = values in this interval not displayed.  Hepatic Function Latest Ref Rng & Units 07/15/2017 07/14/2017 07/14/2017  Total Protein 6.5 - 8.1 g/dL 6.5 - -  Albumin 3.5 - 5.0 g/dL 1.7(L) 1.8(L) 1.9(L)  AST 15 - 41 U/L 40 - -  ALT 17 - 63 U/L 7(L) - -  Alk Phosphatase 38 - 126 U/L 147(H) - -  Total Bilirubin 0.3 - 1.2 mg/dL 3.6(H) - -  Bilirubin, Direct 0.1 - 0.5 mg/dL 2.1(H) - -   INR: 5.1  ASSESSMENT & PLAN  Chronically-ill 61 y/o M with extensive Mhx here with respiratory failure, acute on chronic renal failure, anasarca, shock and MSSA bacteremia. Persistently hypotensive requiring Levophed. Started on CRRT 8/6 due to failure of lasix.  Pulling well, net neg 6.1 L since initiation. He was recently admitted and diuresed however presented 30 lbs up from his dry weight just 2 weeks after discharge.   1. Acute on CKD4 - currently on CRRT, Anasarca: On CRRT since 8/6 and continues to pull well, about 100-234ms/hr via right temp HD cath. Net neg 6.1L since initiation. Down an additional 2.7L yesterday. CW 185lbs, Dry weight felt to be~175 lbs. Does have right arm AVF however hold off using while on CRRT  1. Continue CRRT 2. Daily weights, strict I&O 3. Daily renal function panels, replace lytes as indicated 2. Cardiogenic & Septic Shock requiring pressor support, NSTEMI: Remains on levophed. Septic shock due to infectious process described below.  1. BP support per crit care 3. MSSA Bacteremia, BL LE wounds: ID on board. Currently on Cefazolin 2g daily. Rifampin d/c 2/2 concerns of hepatotoxicity and questionable benefit currently.  1. Final repeat Bcx from 8/6 without growth 2. Abx per ID 3. Will eventually need TEE and removal of ICD with temp pacer should he survive however not in position to tolerate currently 4. ICM with AICD placement: As above. Appreciate EP input and assistance  with transfer to extraction center, should patient reach that point.  5. Anemia, Iron deficiency: Hb 8.3 today, was 8.8 on admission. Iron deficient. 1. Needs IV iron replacement once infection resolved 2. Transfuse prn 6. Chronic Atrial Fibrillation: On systemic heparin.  7. Hyponatremia: Lytes improving with CCRT. 1. Hyperbilirubinemia, hx of cirrhosis: elevated T-bili to nearly 5 with INR of 5.4 despite  several days without warfarn. ?Rifampin hepatotox vs shock liver 1. Bilirubin levels continue to improve. INR improving very slowly.   Einar Gip, DO Internal Medicine - PGY2 07/15/2017, 8:08 AM   Agree with excellent note of Dr. Danford Bad. Looks/feels better, CRRT going well, pressors down some since yesterday, neg blood cultures. Still has massive anasarca despite down 8 kg since start of CRRT. Phos creeping down. No replacemet yet. Changing diet to regular. Plans as above.  [name], MD [place] Pager 07/15/2017, 10:12 AM

## 2017-07-15 NOTE — Progress Notes (Signed)
[place] for heparin Indication: atrial fibrillation  No Known Allergies  Patient Measurements: Height: 5\' 10"  (177.8 cm) Weight: 185 lb 3 oz (84 kg) IBW/kg (Calculated) : 73  Heparin dosing weight: 90.6 kg  Vital Signs: Temp: 98.2 F (36.8 C) (08/10 0803) Temp Source: Oral (08/10 0803) BP: 101/46 (08/10 0945) Pulse Rate: 72 (08/10 0945)  Labs:  Recent Labs  07/13/17 0337  07/14/17 0416 07/14/17 1542 07/15/17 0350  HGB 8.4*  --   --   --  8.3*  HCT 26.1*  --   --   --  26.1*  PLT 187  --   --   --  149*  LABPROT 51.0*  --  49.7*  --  48.9*  INR 5.42*  --  5.25*  --  5.14*  CREATININE  --   < > 1.45* 1.36* 1.36*  < > = values in this interval not displayed.  Estimated Creatinine Clearance: 58.1 mL/min (A) (by C-G formula based on SCr of 1.36 mg/dL (H)).  Assessment: 61 year old male with chronic afib on warfarin prior to admission. Patient recently seen in clinic with supratherapeutic INR 3.7 dose reduced to 1mg  of coumadin daily except 2mg  on Tuesday, Thursday, Saturday.  Pt continues on CRRT. Planning to hold warfarin and begin heparin when INR eventually trends down, still >5 this am.  Goal of Therapy:  Heparin level goal 0.3-0.7 INR 2-3 Monitor platelets by anticoagulation protocol: Yes   Plan:  -Begin heparin when INR < 2 -Can likely begin at 1300 units/hr, order not placed yet -Follow daily INR for now  Erin Hearing PharmD., BCPS Clinical Pharmacist Pager [phone] 07/15/2017 10:00 AM

## 2017-07-16 LAB — COOXEMETRY PANEL
Carboxyhemoglobin: 2.3 % — ABNORMAL HIGH (ref 0.5–1.5)
Methemoglobin: 1.2 % (ref 0.0–1.5)
O2 SAT: 83.2 %
Total hemoglobin: 8.2 g/dL — ABNORMAL LOW (ref 12.0–16.0)

## 2017-07-16 LAB — RENAL FUNCTION PANEL
ALBUMIN: 1.9 g/dL — AB (ref 3.5–5.0)
ANION GAP: 10 (ref 5–15)
Albumin: 1.6 g/dL — ABNORMAL LOW (ref 3.5–5.0)
Anion gap: 5 (ref 5–15)
BUN: 26 mg/dL — ABNORMAL HIGH (ref 6–20)
BUN: 23 mg/dL — AB (ref 6–20)
CALCIUM: 8.3 mg/dL — AB (ref 8.9–10.3)
CHLORIDE: 99 mmol/L — AB (ref 101–111)
CO2: 24 mmol/L (ref 22–32)
CO2: 27 mmol/L (ref 22–32)
CREATININE: 1.3 mg/dL — AB (ref 0.61–1.24)
CREATININE: 1.25 mg/dL — AB (ref 0.61–1.24)
Calcium: 8.2 mg/dL — ABNORMAL LOW (ref 8.9–10.3)
Chloride: 98 mmol/L — ABNORMAL LOW (ref 101–111)
GFR, EST NON AFRICAN AMERICAN: 57 mL/min — AB (ref >60)
Glucose, Bld: 189 mg/dL — ABNORMAL HIGH (ref 65–99)
Glucose, Bld: 218 mg/dL — ABNORMAL HIGH (ref 65–99)
PHOSPHORUS: 2.9 mg/dL (ref 2.5–4.6)
Phosphorus: 2.6 mg/dL (ref 2.5–4.6)
Potassium: 4.5 mmol/L (ref 3.5–5.1)
Potassium: 4.6 mmol/L (ref 3.5–5.1)
SODIUM: 132 mmol/L — AB (ref 135–145)
SODIUM: 131 mmol/L — AB (ref 135–145)

## 2017-07-16 LAB — CBC
HCT: 27 % — ABNORMAL LOW (ref 39.0–52.0)
Hemoglobin: 8.2 g/dL — ABNORMAL LOW (ref 13.0–17.0)
MCH: 28.5 pg (ref 26.0–34.0)
MCHC: 30.4 g/dL (ref 30.0–36.0)
MCV: 93.8 fL (ref 78.0–100.0)
PLATELETS: 164 10*3/uL (ref 150–400)
RBC: 2.88 MIL/uL — AB (ref 4.22–5.81)
RDW: 17 % — AB (ref 11.5–15.5)
WBC: 14.3 10*3/uL — AB (ref 4.0–10.5)

## 2017-07-16 LAB — CULTURE, BLOOD (ROUTINE X 2)
CULTURE: NO GROWTH
Culture: NO GROWTH
SPECIAL REQUESTS: ADEQUATE
Special Requests: ADEQUATE

## 2017-07-16 LAB — GLUCOSE, CAPILLARY
GLUCOSE-CAPILLARY: 163 mg/dL — AB (ref 65–99)
GLUCOSE-CAPILLARY: 158 mg/dL — AB (ref 65–99)
Glucose-Capillary: 187 mg/dL — ABNORMAL HIGH (ref 65–99)
Glucose-Capillary: 220 mg/dL — ABNORMAL HIGH (ref 65–99)

## 2017-07-16 LAB — PROTIME-INR
INR: 5.23 — AB
Prothrombin Time: 47.8 seconds — ABNORMAL HIGH (ref 11.4–15.2)

## 2017-07-16 LAB — MAGNESIUM: MAGNESIUM: 2.5 mg/dL — AB (ref 1.7–2.4)

## 2017-07-16 LAB — CORTISOL: Cortisol, Plasma: 15.5 ug/dL

## 2017-07-16 MED ORDER — ALBUMIN HUMAN 25 % IV SOLN
25.0000 g | Freq: Four times a day (QID) | INTRAVENOUS | Status: AC
  Administered 2017-07-16 – 2017-07-17 (×4): 25 g via INTRAVENOUS
  Filled 2017-07-16 (×3): qty 100
  Filled 2017-07-16: qty 50

## 2017-07-16 NOTE — Progress Notes (Signed)
[facility] Rounding Note  SUBJECTIVE: Interval events:  Sat up for 3 1/2 hours yesterday Indicates he feels much better Fluid coming off with CRRT Weight down from max of 92 kg to 83.4 kg today  OBJECTIVE  Intake/Output Summary (Last 24 hours) at 07/16/17 1050 Last data filed at 07/16/17 1000  Gross per 24 hour  Intake            673.1 ml  Output             3157 ml  Net          -2483.9 ml   Filed Weights   07/14/17 0500 07/15/17 0500 07/16/17 0630  Weight: 85.5 kg (188 lb 7.9 oz) 84 kg (185 lb 3 oz) 83.4 kg (183 lb 13.8 oz)   Physical Exam   Blood pressure (!) 103/46, pulse 77, temperature 98.2 F (36.8 C), temperature source Oral, resp. rate 12, height _0  (1.778 m), weight 185 lb 3 oz (84 kg), SpO2 97 %. Chronically-ill appearing caucasian male resting in bed.  Upbeat  Generalized anasarca with pitting edema, improved somewhat Regular on tel. S1S2 normal.  Right IJ temp HD cath (8/5).  L sided ICD nontender. Lungs clear.   Protuberant abdomen with pitting edema of abdominal wall. +Colostomy RUE fistula. + bruit 2-3+ edema to hips   Scheduled Meds: . Chlorhexidine Gluconate Cloth  6 each Topical Daily  . colchicine  0.3 mg Oral Daily  . insulin aspart  0-5 Units Subcutaneous QHS  . insulin aspart  0-9 Units Subcutaneous TID WC  . insulin aspart  3 Units Subcutaneous TID WC  . sodium chloride flush  10-40 mL Intracatheter Q12H   Continuous Infusions: . sodium chloride Stopped (07/15/17 1500)  .  ceFAZolin (ANCEF) IV 2 g (07/16/17 0544)  . heparin    . norepinephrine (LEVOPHED) Adult infusion 8.96 mcg/min (07/16/17 1000)  . dialysis replacement fluid (prismasate) 300 mL/hr at 07/16/17 0915  . dialysis replacement fluid (prismasate) 200 mL/hr at 07/16/17 0250  . dialysate (PRISMASATE) 1,000 mL/hr at 07/16/17 0914    Recent Labs Lab 07/09/17 2132  07/10/17 1115  07/12/17 0424 07/13/17 0337 07/15/17 0350 07/16/17 0324  WBC 18.1*  --  28.0*  < > 29.9* 24.0*  18.6* 14.3*  NEUTROABS 16.8*  --  26.0*  --  28.1*  --   --   --   HGB 8.2*  < > 8.3*  < > 7.9* 8.4* 8.3* 8.2*  HCT 24.9*  < > 25.2*  < > 24.2* 26.1* 26.1* 27.0*  MCV 92.6  --  92.6  < > 89.3 90.3 92.2 93.8  PLT 158  --  175  < > 172 187 149* 164  < > = values in this interval not displayed.  Recent Labs Lab 07/13/17 0341 07/13/17 1707 07/14/17 0416 07/14/17 1542 07/15/17 0350 07/15/17 1616 07/16/17 0324  NA 128* 129* 132* 131* 132* 132* 132*  K 4.1 4.2 4.3 4.0 4.3 4.6 4.5  CL 94* 94* 97* 98* 99* 98* 98*  CO2 _1 GLUCOSE 185* 198* 182* 152* 159* 215* 189*  BUN 56* 47* 39* 37* 31* 29* 26*  CREATININE 1.90* 1.57* 1.45* 1.36* 1.36* 1.34* 1.30*  CALCIUM 8.4* 8.4* 8.3* 8.3* 8.1* 8.2* 8.2*  PHOS 2.8 2.8 2.7 2.5 2.4* 2.8 2.9    Hepatic Function Latest Ref Rng & Units 07/16/2017 07/15/2017 07/15/2017  Total Protein 6.5 - 8.1 g/dL - - 6.5  Albumin 3.5 -  5.0 g/dL 1.6(L) 1.7(L) 1.7(L)  AST 15 - 41 U/L - - 40  ALT 17 - 63 U/L - - 7(L)  Alk Phosphatase 38 - 126 U/L - - 147(H)  Total Bilirubin 0.3 - 1.2 mg/dL - - 3.6(H)  Bilirubin, Direct 0.1 - 0.5 mg/dL - - 2.1(H)   Lab Results  Component Value Date   INR 5.23 (HH) 07/16/2017   INR 5.14 (HH) 07/15/2017   INR 5.25 (HH) 07/14/2017     Component Value Date   IRON 28 (L) 07/12/2017   TIBC 231 (L) 07/12/2017   FERRITIN 238 07/12/2017    Background: 61 y/o M w/ sCHF s/p AICD placement,ICM/systolic HF, AFib, DM2,  advanced CKD with RUE AVF (required HD 06/2016-10/2016 last year for ischemic ATN, nephrologist is [place]), cirrhosis, history of rectal cancer s/p resection and colostomy. Recent admission 7/9-7/17 for volume overload, diuresed with IV lasix, metolazone and milrinone until day of discharge, discharged on home dose of torsemide. Developed SOB, worsening edema and 30 lbs weight gain over 2-3 week period. Admitted 07/09/17 with respiratory failure, demand ischemia, volume overload. Mixed cardiogenic/septic shock  (MSSA bacteremia). Did not require intubation. AKI on CKD. UOP trailed off despite lasix drip. CRRT initiated 8/6 via temp cath for AKI/massive volume overload..    ASSESSMENT & PLAN  1. Acute on CKD4.anasarca - CRRT since 8/6. (temp cath 8/6) Weight down 92k (202 lb) g (max)->83.4 kg (183 lb)  today, still w/elevated CVP/anasarca. Dry weight prev felt to be~175 lbs but I suspect is much lower as currently 183 lb with edema still pitting abd wall, thighs. R upper AVF patent. Continue CRRT. Alb quite low. Will give alb Q6H for 24 hours to help mobilize extensive 3rd spaced fluids.  2. Mixed cardiogenic/septic shock -  MSSA bacteremia/NSTEMI. Remains on norepi.Not on inotropes.  3. MSSA Bacteremia, BL LE wounds: ID on board. Repeat BC's neg. WBC falling. Currently Cefazolin 2g daily. Rifampin d/c 2/2 concerns of hepatotoxicity. Will eventually need TEE and removal of ICD with temp pacer and removal of HD line but not in position to do this now 4. ICM with AICD: Will need eventual extraction of ICD 5. Anemia, Iron deficiency: Hb stable low 8's. Low TSat. Needs IV iron replacement once infection resolved. Transfuse prn 6. Chronic Atrial Fibrillation: Off warfarin 2/2 prolonged INR still supratherapeutic INR 7. Hyponatremia: Stable on CCRT 8. Hyperbilirubinemia, hx of cirrhosis: elevated T-bili to nearly 5 with INR of 5.4 despite several days without warfarn. ?Rifampin hepatotox vs shock liver 9. Supratherapeutic INR - has not rec'd Vit K or FFP. INR still >5 10. DM - SSI 11.   [name], MD [facility] [phone] Pager 07/16/2017, 11:02 AM

## 2017-07-16 NOTE — Progress Notes (Signed)
[place] for heparin Indication: atrial fibrillation  No Known Allergies  Patient Measurements: Height: 5\' 10"  (177.8 cm) Weight: 183 lb 13.8 oz (83.4 kg) IBW/kg (Calculated) : 73  Heparin dosing weight: 90.6 kg  Vital Signs: Temp: 96.9 F (36.1 C) (08/11 0730) Temp Source: Oral (08/11 0730) BP: 113/55 (08/11 1000) Pulse Rate: 70 (08/11 1000)  Labs:  Recent Labs  07/14/17 0416  07/15/17 0350 07/15/17 1616 07/16/17 0324 07/16/17 0744  HGB  --   --  8.3*  --  8.2*  --   HCT  --   --  26.1*  --  27.0*  --   PLT  --   --  149*  --  164  --   LABPROT 49.7*  --  48.9*  --   --  47.8*  INR 5.25*  --  5.14*  --   --  5.23*  CREATININE 1.45*  < > 1.36* 1.34* 1.30*  --   < > = values in this interval not displayed.  Estimated Creatinine Clearance: 60.8 mL/min (A) (by C-G formula based on SCr of 1.3 mg/dL (H)).  Assessment: 61 year old male with chronic afib on warfarin prior to admission. Patient recently seen in clinic with supratherapeutic INR 3.7 dose reduced to 1mg  of coumadin daily except 2mg  on Tuesday, Thursday, Saturday.  Pt continues on CRRT. Planning to hold warfarin and begin heparin when INR eventually trends down, remains elevated at 5.23 this morning.  Goal of Therapy:  Heparin level goal 0.3-0.7 INR 2-3 Monitor platelets by anticoagulation protocol: Yes   Plan:  -Begin heparin when INR < 2 -Can likely begin at 1300 units/hr, order not placed yet -Follow daily INR for now  Arrie Senate, PharmD PGY-2 Cardiology Pharmacy Resident Pager: [phone] 07/16/2017

## 2017-07-16 NOTE — Progress Notes (Signed)
Inpatient Rehabilitation  Per OT and PT request patient was screened by Gunnar Fusi for appropriateness for an Inpatient Acute Rehab consult.  At this time we are recommending an Inpatient Rehab consult.  Please order if you are agreeable.    Carmelia Roller., CCC/SLP Admission Coordinator  [place]  Cell [phone]

## 2017-07-16 NOTE — Progress Notes (Signed)
Patient ID: [name], male   DOB: Apr 24, 1956, 61 y.o.   MRN: [id number]     Advanced Heart Failure Rounding Note  Primary Cardiologist: [name]  Subjective:    Admitted with fatigue, dyspnea, recurrent lower extremity swelling and decreased UOP, cardiogenic shock.   Remains on CRRT and norepi 9 mcg. BP stable.  UF 100 per hour. UOP 160 cc/24 hrs. CVP 13-15.    No dyspnea, feeling somewhat better overall.     Blood CX- 2/2 MSSA ? From LLE wounds?Marland Kitchen On cefazolin and rifampin.   Objective:   Weight Range: 183 lb 13.8 oz (83.4 kg) Body mass index is 26.38 kg/m.   Vital Signs:   Temp:  [96.2 F (35.7 C)-97.7 F (36.5 C)] 96.9 F (36.1 C) (08/11 0730) Pulse Rate:  [69-82] 73 (08/11 0930) Resp:  [7-18] 14 (08/11 0930) BP: (87-123)/(44-67) 108/54 (08/11 0930) SpO2:  [94 %-100 %] 100 % (08/11 0930) Weight:  [183 lb 13.8 oz (83.4 kg)] 183 lb 13.8 oz (83.4 kg) (08/11 0630) Last BM Date: 07/13/17 (ostomy moved, bag changed)  Weight change: Filed Weights   07/14/17 0500 07/15/17 0500 07/16/17 0630  Weight: 188 lb 7.9 oz (85.5 kg) 185 lb 3 oz (84 kg) 183 lb 13.8 oz (83.4 kg)    Intake/Output:   Intake/Output Summary (Last 24 hours) at 07/16/17 0948 Last data filed at 07/16/17 0900  Gross per 24 hour  Intake            775.1 ml  Output             3358 ml  Net          -2582.9 ml      Physical Exam  CVP 13-15 General: NAD, chronically ill-appearing Neck: JVP 14 cm, no thyromegaly or thyroid nodule.  Lungs: Clear to auscultation bilaterally with normal respiratory effort. CV: Nondisplaced PMI.  Heart regular S1/S2, no S3/S4, 2/6 HSM LLSB.  1+ edema to thighs.   Abdomen: Soft, nontender, no hepatosplenomegaly, no distention.  Skin: Intact without lesions or rashes.  Neurologic: Alert and oriented x 3.  Psych: Normal affect. Extremities: No clubbing or cyanosis.  HEENT: Normal.    Telemetry   BiV pacing 60-70s, underlying atrial fibrillation.   Personally  reviewed.   Labs    CBC  Recent Labs  07/15/17 0350 07/16/17 0324  WBC 18.6* 14.3*  HGB 8.3* 8.2*  HCT 26.1* 27.0*  MCV 92.2 93.8  PLT 149* 465   Basic Metabolic Panel  Recent Labs  07/15/17 0350 07/15/17 1616 07/16/17 0324  NA 132* 132* 132*  K 4.3 4.6 4.5  CL 99* 98* 98*  CO2 25 24 24   GLUCOSE 159* 215* 189*  BUN 31* 29* 26*  CREATININE 1.36* 1.34* 1.30*  CALCIUM 8.1* 8.2* 8.2*  MG 2.6*  --  2.5*  PHOS 2.4* 2.8 2.9   Liver Function Tests  Recent Labs  07/15/17 0350 07/15/17 1616 07/16/17 0324  AST 40  --   --   ALT 7*  --   --   ALKPHOS 147*  --   --   BILITOT 3.6*  --   --   PROT 6.5  --   --   ALBUMIN 1.7* 1.7* 1.6*   No results for input(s): LIPASE, AMYLASE in the last 72 hours. Cardiac Enzymes No results for input(s): CKTOTAL, CKMB, CKMBINDEX, TROPONINI in the last 72 hours.  BNP: BNP (last 3 results)  Recent Labs  06/13/17 1205 07/09/17 2151  BNP  1,151.0* 1,553.9*    ProBNP (last 3 results) No results for input(s): PROBNP in the last 8760 hours.   D-Dimer No results for input(s): DDIMER in the last 72 hours. Hemoglobin A1C No results for input(s): HGBA1C in the last 72 hours. Fasting Lipid Panel No results for input(s): CHOL, HDL, LDLCALC, TRIG, CHOLHDL, LDLDIRECT in the last 72 hours. Thyroid Function Tests No results for input(s): TSH, T4TOTAL, T3FREE, THYROIDAB in the last 72 hours.  Invalid input(s): FREET3  Other results:   Imaging    No results found.   Medications:     Scheduled Medications: . Chlorhexidine Gluconate Cloth  6 each Topical Daily  . colchicine  0.3 mg Oral Daily  . insulin aspart  0-5 Units Subcutaneous QHS  . insulin aspart  0-9 Units Subcutaneous TID WC  . insulin aspart  3 Units Subcutaneous TID WC  . sodium chloride flush  10-40 mL Intracatheter Q12H    Infusions: . sodium chloride Stopped (07/15/17 1500)  .  ceFAZolin (ANCEF) IV 2 g (07/16/17 0544)  . heparin    . norepinephrine  (LEVOPHED) Adult infusion 8.96 mcg/min (07/16/17 0900)  . dialysis replacement fluid (prismasate) 300 mL/hr at 07/16/17 0915  . dialysis replacement fluid (prismasate) 200 mL/hr at 07/16/17 0250  . dialysate (PRISMASATE) 1,000 mL/hr at 07/16/17 0914    PRN Medications: sodium chloride, acetaminophen, alum & mag hydroxide-simeth, fentaNYL (SUBLIMAZE) injection, heparin, heparin, heparin, HYDROcodone-acetaminophen, magic mouthwash w/lidocaine, ondansetron (ZOFRAN) IV, sodium chloride flush    Patient Profile   [name] is a 61 year old with a history of DMII, [place] 2016, rectal cancer, CKD Stage IV, chronic A fib, S/P AVN ablation, PAF, CAD BMS LAD 2011 BMS LAD 2009, OSA, chronic systolic heart failure, St Jude BiV ICD admitted with AKI and shock with elevated lactate, suspect cardiogenic.   Assessment/Plan   1. Acute on chronic systolic CHF/cardiogenic shock: Last echo in 8/17 with EF 15-20%.  Ischemic cardiomyopathy, has St Jude BiV ICD (despite chronic afib appears to be effectively BiV pacing).  Had recent admission (discharged 06/21/17) requiring milrinone support and diuresis.  Admitted with soft BP (SBP 80s-90s) + elevated lactate.  Some concern initially for PNA/septic shock, but suspect CXR shows R effusion rather than PNA, no fever.  CVVH begun and ongoing.  Weight coming down.  Still with CVP 13-15, volume overloaded on exam.  - Continue CVVH, UF 100 cc/hr.   - Continue norepinephrine, titrate down as able. Co-ox to be sent.  2. AKI on CKD stage 4: Baseline creatinine around 2.5.  He required brief HD in the past, has RUE fistula. - Started CRRT 8/6. Nephrology appreciated. Long-term HD would be difficult.  3. Atrial fibrillation: Chronic.  Warfarin on hold, INR 5.23.  4. CAD: No CP. Hold statin for now. Hold aspirin.   5. ID: WBC trending down 29>24>18.6>14.  Blood CX 2/2 MSSA. Antibiotics narrowed rifampin and ancef. 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  - EP  consulted. Will eventually need extraction (and temporary-permanent PPM). 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  - Possible TEE on Monday.   6. H/O Rectal CA with diverting colostomy  7. Type II diabetes: Sliding scale.  8. Liver failure: LFTs improving  Statin stopped.    Length of Stay: 6  Loralie Champagne, MD  07/16/2017, 9:48 AM  Advanced Heart Failure Team Pager [phone] (M-F; 7a - 4p)  Please contact [facility] for night-coverage after hours (4p -  7a ) and weekends on [email or web address]

## 2017-07-16 NOTE — Plan of Care (Signed)
Problem: Fluid Volume: Goal: Fluid volume balance will be maintained or improved Outcome: Progressing Pt tolerating CRRT with UF of -100, maintaining adequate hemodynamics on low dose Levophed. Able to keep daily balance >(-)1000. Daily weight approaching dry weight pre-hospital admit.

## 2017-07-16 NOTE — Progress Notes (Signed)
PULMONARY / CRITICAL CARE MEDICINE   Name: [name] MRN: [id number] DOB: [birth year]    ADMISSION DATE:  07/09/2017 CONSULTATION DATE:  07/10/2017  REFERRING MD:  Sarajane Jews  CHIEF COMPLAINT:  dyspnea  HISTORY OF PRESENT ILLNESS:   61 y/o male with a history of ischecmic cardiomyopathy s/p ICD and CKD was admitted on 8/4 with acute respiratory failure due to pulmonary edema. Found to have MSSA bacteremia (8/5).  He has been in shock requiring levophed.    SUBJECTIVE:  Net neg 2.5L in last 24 hours, 8L neg since admit.  Remains on 21mcg levophed.  Pt denies pain, fever, chills, cough / sputum production.    VITAL SIGNS: BP (!) 113/55   Pulse 70   Temp (!) 96.9 F (36.1 C) (Oral)   Resp 12   Ht 5\' 10"  (1.778 m)   Wt 183 lb 13.8 oz (83.4 kg)   SpO2 100%   BMI 26.38 kg/m   HEMODYNAMICS: CVP:  [12 mmHg-15 mmHg] 13 mmHg  VENTILATOR SETTINGS:    INTAKE / OUTPUT: I/O last 3 completed shifts: In: 1512.2 [P.O.:860; I.V.:352.2; IV VFIEPPIRJ:188] Out: 4166 [Urine:245; Other:5102]  PHYSICAL EXAMINATION: General: chronically ill appearing adult male in NAD HEENT: MM pink/moist, R IJ HD cath  PSY: calm/appropriate Neuro: Awakens to voice, appears fatigued, speech clear, MAE / generalized weakness  CV: s1s2 rrr, no m/r/g PULM: even/non-labored, lungs bilaterally clear  AY:TKZS, non-tender, bsx4 active  Extremities: warm/dry, no edema  Skin: no rashes or lesions  LABS:  BMET  Recent Labs Lab 07/15/17 0350 07/15/17 1616 07/16/17 0324  NA 132* 132* 132*  K 4.3 4.6 4.5  CL 99* 98* 98*  CO2 25 24 24   BUN 31* 29* 26*  CREATININE 1.36* 1.34* 1.30*  GLUCOSE 159* 215* 189*    Electrolytes  Recent Labs Lab 07/14/17 0416  07/15/17 0350 07/15/17 1616 07/16/17 0324  CALCIUM 8.3*  < > 8.1* 8.2* 8.2*  MG 2.5*  --  2.6*  --  2.5*  PHOS 2.7  < > 2.4* 2.8 2.9  < > = values in this interval not displayed.  CBC  Recent Labs Lab 07/13/17 0337 07/15/17 0350  07/16/17 0324  WBC 24.0* 18.6* 14.3*  HGB 8.4* 8.3* 8.2*  HCT 26.1* 26.1* 27.0*  PLT 187 149* 164    Coag's  Recent Labs Lab 07/14/17 0416 07/15/17 0350 07/16/17 0744  INR 5.25* 5.14* 5.23*    Sepsis Markers  Recent Labs Lab 07/10/17 0427 07/10/17 1115 07/11/17 0335 07/12/17 0426  LATICACIDVEN 4.7* 3.9* 3.2*  --   PROCALCITON  --  3.96 3.97 2.78    ABG  Recent Labs Lab 07/11/17 0355  PHART 7.470*  PCO2ART 24.9*  PO2ART 60.0*    Liver Enzymes  Recent Labs Lab 07/12/17 1110  07/13/17 0825  07/15/17 0350 07/15/17 1616 07/16/17 0324  AST 34  --  40  --  40  --   --   ALT 8*  --  7*  --  7*  --   --   ALKPHOS 126  --  143*  --  147*  --   --   BILITOT 4.7*  --  4.8*  --  3.6*  --   --   ALBUMIN 2.0*  < > 1.9*  < > 1.7* 1.7* 1.6*  < > = values in this interval not displayed.  Cardiac Enzymes  Recent Labs Lab 07/10/17 0200 07/10/17 1115 07/10/17 1800  TROPONINI 0.11* 1.05* 41.84*    Glucose  Recent Labs Lab 07/14/17 1640 07/14/17 2146 07/15/17 0805 07/15/17 1215 07/15/17 2009 07/16/17 0916  GLUCAP 135* 149* 169* 197* 236* 187*    Imaging No results found.   STUDIES:  07/10/2017 Echo: LVEF 15-20%, severe TR, PA press 80's, no veg seen on valves  CULTURES: 8/4 blood > MSSA 8/6 blood >   ANTIBIOTICS: 8/6 cefazolin >  8/6 rifampin > 8/7 8/5 vanc > 8/5  SIGNIFICANT EVENTS: 8/04  Admit with MSSA bacteremia, shock   LINES/TUBES: 8/5 RIJ HD cath >   DISCUSSION: 61 y/o male with a past medical history significant for CAD, ischemic cardiomyopathy admitted with MSSA bacteremia and demand ischemia associated shock. Now with acute on chronic renal failure.    ASSESSMENT / PLAN:  PULMONARY A: Acute dyspnea/mild hypoxemia due to acute pulmonary edema Pulmonary HTN - noted on ECHO 8/5, PA peak 80 OSA P:   Volume removal with CVVHD O2 to support sats > 92%  CPAP QHS   CARDIOVASCULAR A:  Ischemic cardiomyopathy Shock: mixed  picture septic and cardiogenic Elevated PA pressure, likely due to volume overload ICD in place with staph bacteremia Demand ischemia, troponin 40 Paroxysmal afib Known CAD P:  Levohped for MAP >65 TEE / Pacing per Cardilogy  Volume removal with CVVHD  RENAL A:   Acute on chronic renal failure R arm fistula in place P:   Trend BMP / urinary output Replace electrolytes as indicated Avoid nephrotoxic agents, ensure adequate renal perfusion CVVHD per Nephrology   GASTROINTESTINAL A:   History of colostomy/rectal cancer P:   Heart healthy diet as tolerated  Aspiration precautions  HEMATOLOGIC A:   Anemia without bleeding Coagulopathy from sepsis/antibiotics/cirrhosis P:  Trend CBC  Hold coumadin   INFECTIOUS A:   MSSA bacteremia with ICD in place, HD cath in place (placed 8/5) R leg pain> source of staph? Has wound R leg;  P:   ID following appreciate input  ABX per ID  TEE per Cardiology / ID ID rec's > device removal and TEE  ENDOCRINE A:   Hyperglycemia P:   SSI  NEUROLOGIC A:   No acute issues P:   Monitor / supportive care  ORTHO A: Achilles tendon pain, hip/knee pain - arthritic? P: Monitor for further pain   FAMILY  - Updates: Patient updated on plan of care 8/11.  No family at bedside.    CC Time: 30 minutes  Noe Gens, NP-C [facility] Pgr: [phone] or if no answer [phone] 07/16/2017, 11:01 AM

## 2017-07-16 NOTE — Progress Notes (Signed)
Pharmacy Antibiotic Note  FILIP LUTEN is a 61 y.o. male admitted on 07/09/2017 with sepsis. Pharmacy consulted to dose cefazolin for MSSA bacteremia. Patient was on rifampin with Cefazolin, but the rifampin was discontinued 8/8 due to hyperbilirubinemia and elevated INR. Pt has been on CRRT since 8/6. WBC trending down, temp low, blood cultures from 8/06 have been no growth x4 days. TEE tentatively planned for 8/13.  Plan: -Continue Cefazolin 2gm IV Q12h -Monitor CRRT daily -F/U plans for TEE  Temp (24hrs), Avg:97 F (36.1 C), Min:96.2 F (35.7 C), Max:97.7 F (36.5 C)   Recent Labs Lab 07/10/17 0132 07/10/17 0213 07/10/17 0427 07/10/17 1115  07/11/17 0335  07/12/17 0424  07/13/17 0337  07/14/17 0416 07/14/17 1542 07/15/17 0350 07/15/17 1616 07/16/17 0324  WBC  --   --   --  28.0*  --  31.2*  --  29.9*  --  24.0*  --   --   --  18.6*  --  14.3*  CREATININE  --   --   --  3.68*  < > 3.69*  < >  --   < >  --   < > 1.45* 1.36* 1.36* 1.34* 1.30*  LATICACIDVEN 5.4* 5.31* 4.7* 3.9*  --  3.2*  --   --   --   --   --   --   --   --   --   --   < > = values in this interval not displayed.  Estimated Creatinine Clearance: 60.8 mL/min (A) (by C-G formula based on SCr of 1.3 mg/dL (H)).    No Known Allergies   Arrie Senate, PharmD PGY-2 Cardiology Pharmacy Resident Pager: [phone] 07/16/2017

## 2017-07-17 LAB — RENAL FUNCTION PANEL
ANION GAP: 7 (ref 5–15)
Albumin: 2.3 g/dL — ABNORMAL LOW (ref 3.5–5.0)
Albumin: 2.3 g/dL — ABNORMAL LOW (ref 3.5–5.0)
Anion gap: 6 (ref 5–15)
BUN: 22 mg/dL — ABNORMAL HIGH (ref 6–20)
BUN: 20 mg/dL (ref 6–20)
CALCIUM: 8.1 mg/dL — AB (ref 8.9–10.3)
CHLORIDE: 99 mmol/L — AB (ref 101–111)
CO2: 26 mmol/L (ref 22–32)
CO2: 27 mmol/L (ref 22–32)
CREATININE: 1.26 mg/dL — AB (ref 0.61–1.24)
Calcium: 8.4 mg/dL — ABNORMAL LOW (ref 8.9–10.3)
Chloride: 99 mmol/L — ABNORMAL LOW (ref 101–111)
Creatinine, Ser: 1.27 mg/dL — ABNORMAL HIGH (ref 0.61–1.24)
GFR calc non Af Amer: 59 mL/min — ABNORMAL LOW (ref >60)
GFR calc non Af Amer: 59 mL/min — ABNORMAL LOW (ref >60)
GLUCOSE: 214 mg/dL — AB (ref 65–99)
GLUCOSE: 246 mg/dL — AB (ref 65–99)
PHOSPHORUS: 2.5 mg/dL (ref 2.5–4.6)
POTASSIUM: 4.3 mmol/L (ref 3.5–5.1)
Phosphorus: 2.6 mg/dL (ref 2.5–4.6)
Potassium: 4.6 mmol/L (ref 3.5–5.1)
SODIUM: 132 mmol/L — AB (ref 135–145)
Sodium: 132 mmol/L — ABNORMAL LOW (ref 135–145)

## 2017-07-17 LAB — CBC
HCT: 25.4 % — ABNORMAL LOW (ref 39.0–52.0)
HEMOGLOBIN: 7.9 g/dL — AB (ref 13.0–17.0)
MCH: 29.2 pg (ref 26.0–34.0)
MCHC: 31.1 g/dL (ref 30.0–36.0)
MCV: 93.7 fL (ref 78.0–100.0)
PLATELETS: 124 10*3/uL — AB (ref 150–400)
RBC: 2.71 MIL/uL — AB (ref 4.22–5.81)
RDW: 17.2 % — ABNORMAL HIGH (ref 11.5–15.5)
WBC: 15.4 10*3/uL — AB (ref 4.0–10.5)

## 2017-07-17 LAB — PROTIME-INR
INR: 4.83 — AB
PROTHROMBIN TIME: 46.5 s — AB (ref 11.4–15.2)

## 2017-07-17 LAB — GLUCOSE, CAPILLARY
GLUCOSE-CAPILLARY: 206 mg/dL — AB (ref 65–99)
GLUCOSE-CAPILLARY: 266 mg/dL — AB (ref 65–99)
Glucose-Capillary: 258 mg/dL — ABNORMAL HIGH (ref 65–99)

## 2017-07-17 LAB — COOXEMETRY PANEL
CARBOXYHEMOGLOBIN: 2.2 % — AB (ref 0.5–1.5)
METHEMOGLOBIN: 1.2 % (ref 0.0–1.5)
O2 Saturation: 65.2 %
TOTAL HEMOGLOBIN: 7.2 g/dL — AB (ref 12.0–16.0)

## 2017-07-17 LAB — MAGNESIUM: Magnesium: 2.6 mg/dL — ABNORMAL HIGH (ref 1.7–2.4)

## 2017-07-17 MED ORDER — INSULIN GLARGINE 100 UNIT/ML ~~LOC~~ SOLN
10.0000 [IU] | Freq: Every day | SUBCUTANEOUS | Status: DC
  Administered 2017-07-17 – 2017-08-01 (×15): 10 [IU] via SUBCUTANEOUS
  Filled 2017-07-17 (×19): qty 0.1

## 2017-07-17 NOTE — Progress Notes (Signed)
PCCM Attending:  Patient persistently hyperglycemic. Started Lantus 10u [place] qhs. Requested nurse change to accu-checks q4hr with NPO status at midnight in prep for TEE.   Sonia Baller Ashok Cordia, M.D. [facility] Pager:  [phone] After 3pm or if no response, call [phone] 8:37 PM 07/17/17

## 2017-07-17 NOTE — Progress Notes (Signed)
PULMONARY / CRITICAL CARE MEDICINE   Name: [name] MRN: [id number] DOB: [birth year]    ADMISSION DATE:  07/09/2017 CONSULTATION DATE:  07/10/2017  REFERRING MD:  Irene Limbo  CHIEF COMPLAINT:  dyspnea  HISTORY OF PRESENT ILLNESS:   61 y/o male with a history of ischecmic cardiomyopathy s/p ICD and CKD was admitted on 8/4 with acute respiratory failure due to pulmonary edema. Found to have MSSA bacteremia (8/5).  He has been in shock requiring levophed.    SUBJECTIVE:   Net 2.4L x 24hr . On CRRT with neg (100cc/hr) . Remains on Levo . Eating breakfast. Denies dyspnea.    VITAL SIGNS: BP (!) 99/55   Pulse 78   Temp (!) 97.5 F (36.4 C) (Oral)   Resp 16   Ht 5\' 10"  (1.778 m)   Wt 181 lb 3.5 oz (82.2 kg)   SpO2 100%   BMI 26.00 kg/m   HEMODYNAMICS: CVP:  [12 mmHg-15 mmHg] 12 mmHg  VENTILATOR SETTINGS:    INTAKE / OUTPUT: I/O last 3 completed shifts: In: 906.5 [P.O.:180; I.V.:276.5; IV Piggyback:450] Out: 4142 [Urine:125; Other:4017]  PHYSICAL EXAMINATION: General: Chronically ill appearing male  HEENT: MM pink and moist, RIJ HD cath .  PSY: calm/appropriate Neuro: a/o x 3 , MAE, gen weakness.  CV: RRR , no m/r/g. , +1edema  PULM: CTA bilaterally  GI: Soft, NT , BS +  Extremities: w/d , warm  Skin: leg dressing   LABS:  BMET  Recent Labs Lab 07/16/17 0324 07/16/17 1735 07/17/17 0326  NA 132* 131* 132*  K 4.5 4.6 4.3  CL 98* 99* 99*  CO2 24 27 26   BUN 26* 23* 22*  CREATININE 1.30* 1.25* 1.27*  GLUCOSE 189* 218* 214*    Electrolytes  Recent Labs Lab 07/15/17 0350  07/16/17 0324 07/16/17 1735 07/17/17 0326  CALCIUM 8.1*  < > 8.2* 8.3* 8.4*  MG 2.6*  --  2.5*  --  2.6*  PHOS 2.4*  < > 2.9 2.6 2.5  < > = values in this interval not displayed.  CBC  Recent Labs Lab 07/15/17 0350 07/16/17 0324 07/17/17 0326  WBC 18.6* 14.3* 15.4*  HGB 8.3* 8.2* 7.9*  HCT 26.1* 27.0* 25.4*  PLT 149* 164 124*    Coag's  Recent Labs Lab  07/15/17 0350 07/16/17 0744 07/17/17 0651  INR 5.14* 5.23* 4.83*    Sepsis Markers  Recent Labs Lab 07/10/17 1115 07/11/17 0335 07/12/17 0426  LATICACIDVEN 3.9* 3.2*  --   PROCALCITON 3.96 3.97 2.78    ABG  Recent Labs Lab 07/11/17 0355  PHART 7.470*  PCO2ART 24.9*  PO2ART 60.0*    Liver Enzymes  Recent Labs Lab 07/12/17 1110  07/13/17 0825  07/15/17 0350  07/16/17 0324 07/16/17 1735 07/17/17 0326  AST 34  --  40  --  40  --   --   --   --   ALT 8*  --  7*  --  7*  --   --   --   --   ALKPHOS 126  --  143*  --  147*  --   --   --   --   BILITOT 4.7*  --  4.8*  --  3.6*  --   --   --   --   ALBUMIN 2.0*  < > 1.9*  < > 1.7*  < > 1.6* 1.9* 2.3*  < > = values in this interval not displayed.  Cardiac Enzymes  Recent  Labs Lab 07/10/17 1115 07/10/17 1800  TROPONINI 1.05* 41.84*    Glucose  Recent Labs Lab 07/15/17 1215 07/15/17 2009 07/16/17 0916 07/16/17 1227 07/16/17 1706 07/16/17 2106  GLUCAP 197* 236* 187* 163* 158* 220*    Imaging No results found.   STUDIES:  07/10/2017 Echo: LVEF 15-20%, severe TR, PA press 80's, no veg seen on valves  CULTURES: 8/4 blood > MSSA 8/6 blood > NEG   ANTIBIOTICS: 8/6 cefazolin >  8/6 rifampin > 8/7 8/5 vanc > 8/5  SIGNIFICANT EVENTS: 8/04  Admit with MSSA bacteremia, shock   LINES/TUBES: 8/5 RIJ HD cath >   DISCUSSION: 60 y/o male with a past medical history significant for CAD, ischemic cardiomyopathy admitted with MSSA bacteremia and demand ischemia associated shock. Now with acute on chronic renal failure.    ASSESSMENT / PLAN:  PULMONARY A: Acute dyspnea/mild hypoxemia due to acute pulmonary edema Pulmonary HTN - noted on ECHO 8/5, PA peak 80 OSA P:   Volume removal with CVVHD O2 to support sats > 92%  CPAP QHS   CARDIOVASCULAR A:  Ischemic cardiomyopathy Shock: mixed picture septic and cardiogenic Elevated PA pressure, likely due to volume overload ICD in place with staph  bacteremia Demand ischemia, troponin 40 Paroxysmal afib Known CAD P:  Levohped for MAP >65 TEE / Pacing per Cardilogy  Volume removal with CVVHD  RENAL A:   Acute on chronic renal failure R arm fistula in place P:   Trend BMP / urinary output Replace electrolytes as indicated Avoid nephrotoxic agents, ensure adequate renal perfusion CVVHD per Nephrology   GASTROINTESTINAL A:   History of colostomy/rectal cancer P:   Heart healthy diet as tolerated  Aspiration precautions  HEMATOLOGIC A:   Anemia without bleeding Coagulopathy from sepsis/antibiotics/cirrhosis P:  Trend CBC  Hold coumadin   INFECTIOUS A:   MSSA bacteremia with ICD in place, HD cath in place (placed 8/5) R leg pain> source of staph? Has wound R leg;  P:   ID following appreciate input  ABX per ID  TEE per Cardiology / ID ID rec's > device removal and TEE  ENDOCRINE A:   Hyperglycemia P:   SSI  NEUROLOGIC A:   No acute issues P:   Monitor / supportive care  ORTHO A: Achilles tendon pain, hip/knee pain - arthritic? P: Monitor for further pain   FAMILY  - Updates: Patient updated on plan of care 8/11.  No family at bedside.      Allaya Abbasi NP-C  [facility]  [phone]    07/17/2017, 9:37 AM

## 2017-07-17 NOTE — Progress Notes (Signed)
ANTICOAGULATION CONSULT NOTE   Pharmacy Consult for heparin Indication: atrial fibrillation  No Known Allergies  Patient Measurements: Height: 5\' 10"  (177.8 cm) Weight: 181 lb 3.5 oz (82.2 kg) IBW/kg (Calculated) : 73  Heparin dosing weight: 90.6 kg  Vital Signs: Temp: 97.9 F (36.6 C) (08/12 0330) Temp Source: Oral (08/12 0330) BP: 92/54 (08/12 0830) Pulse Rate: 76 (08/12 0830)  Labs:  Recent Labs  07/15/17 0350  07/16/17 0324 07/16/17 0744 07/16/17 1735 07/17/17 0326 07/17/17 0651  HGB 8.3*  --  8.2*  --   --  7.9*  --   HCT 26.1*  --  27.0*  --   --  25.4*  --   PLT 149*  --  164  --   --  124*  --   LABPROT 48.9*  --   --  47.8*  --   --  46.5*  INR 5.14*  --   --  5.23*  --   --  4.83*  CREATININE 1.36*  < > 1.30*  --  1.25* 1.27*  --   < > = values in this interval not displayed.  Estimated Creatinine Clearance: 62.3 mL/min (A) (by C-G formula based on SCr of 1.27 mg/dL (H)).  Assessment: 61 year old male with chronic afib on warfarin prior to admission. Patient recently seen in clinic with supratherapeutic INR 3.7 dose reduced to 1mg  of coumadin daily except 2mg  on Tuesday, Thursday, Saturday.  Pt continues on CRRT. Planning to hold warfarin and begin heparin when INR eventually trends down, remains elevated at 4.83 this morning but appears to be finally trending down.  Goal of Therapy:  Heparin level goal 0.3-0.7 INR 2-3 Monitor platelets by anticoagulation protocol: Yes   Plan:  -Begin heparin when INR < 2 -Can likely begin at 1300 units/hr, order not placed yet -Follow daily INR for now  Arrie Senate, PharmD PGY-2 Cardiology Pharmacy Resident Pager: [phone] 07/17/2017

## 2017-07-17 NOTE — Plan of Care (Signed)
Problem: Activity: Goal: Activity intolerance will improve Outcome: Progressing Pt working with physical therapy, O.T. Consulted. Progress notes from [place] rehab staff recommending Inpt rehab when ordered.  Problem: Fluid Volume: Goal: Fluid volume balance will be maintained or improved Outcome: Progressing Pt tolerating CRRT with UF goal of (-)100 per hour, creatinine approaching reported baseline.

## 2017-07-17 NOTE — Progress Notes (Signed)
[facility] Rounding Note  SUBJECTIVE:  Brief HPI: 61 y/o M with ischemic cardiomyopathy s/p ICD placement and hx of chronic renal failure admitted 8/4 with acute respiratory failure secondary to pulmonary edema. Found to have MSSA bacteremia. He has had persistent shock requiring vasopressor support and is on renal replacement therapy (8/6).   Interval events:  Continues to feel well.  Requiring less NE (7.5) Fluid continues to pull well with CRRT.  Net out 2L yesterday (28ms of UOP recorded), net down 10 L since admit Weight down from max of 92 kg to 82.2 kg (83.4kg yest).   Inpatient rehab has been recommended.   OBJECTIVE  Intake/Output Summary (Last 24 hours) at 07/17/17 0834 Last data filed at 07/17/17 0800  Gross per 24 hour  Intake           577.86 ml  Output             2550 ml  Net         -1972.14 ml   Filed Weights   07/15/17 0500 07/16/17 0630 07/17/17 0630  Weight: 185 lb 3 oz (84 kg) 183 lb 13.8 oz (83.4 kg) 181 lb 3.5 oz (82.2 kg)   Physical Exam   Blood pressure (!) 103/46, pulse 77, temperature 98.2 F (36.8 C), temperature source Oral, resp. rate 12, height '5\' 10"'$  (1.778 m), weight 185 lb 3 oz (84 kg), SpO2 97 %. Chronically-ill appearing caucasian male resting in bed. Upbeat  Generalized anasarca with pitting edema, improved RRR. Right IJ temp HD cath (8/5).  L sided ICD nontender. Lungs clear.   Protuberant abdomen with pitting edema of abdominal wall. +Colostomy RUE fistula. + bruit 2-3+ edema to hips  Scheduled Meds: . Chlorhexidine Gluconate Cloth  6 each Topical Daily  . colchicine  0.3 mg Oral Daily  . insulin aspart  0-5 Units Subcutaneous QHS  . insulin aspart  0-9 Units Subcutaneous TID WC  . insulin aspart  3 Units Subcutaneous TID WC  . sodium chloride flush  10-40 mL Intracatheter Q12H   Continuous Infusions: . sodium chloride Stopped (07/15/17 1500)  .  ceFAZolin (ANCEF) IV Stopped (07/17/17 0542)  . heparin    . norepinephrine (LEVOPHED)  Adult infusion 8 mcg/min (07/17/17 0800)  . dialysis replacement fluid (prismasate) 300 mL/hr at 07/17/17 0330  . dialysis replacement fluid (prismasate) 200 mL/hr at 07/16/17 0250  . dialysate (PRISMASATE) 1,000 mL/hr at 07/17/17 0826    Recent Labs Lab 07/10/17 1115  07/12/17 0424  07/15/17 0350 07/16/17 0324 07/17/17 0326  WBC 28.0*  < > 29.9*  < > 18.6* 14.3* 15.4*  NEUTROABS 26.0*  --  28.1*  --   --   --   --   HGB 8.3*  < > 7.9*  < > 8.3* 8.2* 7.9*  HCT 25.2*  < > 24.2*  < > 26.1* 27.0* 25.4*  MCV 92.6  < > 89.3  < > 92.2 93.8 93.7  PLT 175  < > 172  < > 149* 164 124*  < > = values in this interval not displayed.  Recent Labs Lab 07/14/17 0416 07/14/17 1542 07/15/17 0350 07/15/17 1616 07/16/17 0324 07/16/17 1735 07/17/17 0326  NA 132* 131* 132* 132* 132* 131* 132*  K 4.3 4.0 4.3 4.6 4.5 4.6 4.3  CL 97* 98* 99* 98* 98* 99* 99*  CO2 '24 24 25 24 24 27 26  '$ GLUCOSE 182* 152* 159* 215* 189* 218* 214*  BUN 39* 37* 31* 29* 26* 23* 22*  CREATININE 1.45* 1.36* 1.36* 1.34* 1.30* 1.25* 1.27*  CALCIUM 8.3* 8.3* 8.1* 8.2* 8.2* 8.3* 8.4*  PHOS 2.7 2.5 2.4* 2.8 2.9 2.6 2.5    Hepatic Function Latest Ref Rng & Units 07/17/2017 07/16/2017 07/16/2017  Total Protein 6.5 - 8.1 g/dL - - -  Albumin 3.5 - 5.0 g/dL 2.3(L) 1.9(L) 1.6(L)  AST 15 - 41 U/L - - -  ALT 17 - 63 U/L - - -  Alk Phosphatase 38 - 126 U/L - - -  Total Bilirubin 0.3 - 1.2 mg/dL - - -  Bilirubin, Direct 0.1 - 0.5 mg/dL - - -   Lab Results  Component Value Date   INR 4.83 (HH) 07/17/2017   INR 5.23 (HH) 07/16/2017   INR 5.14 (HH) 07/15/2017     Component Value Date   IRON 28 (L) 07/12/2017   TIBC 231 (L) 07/12/2017   FERRITIN 238 07/12/2017    Background: 61 y/o M w/ sCHF s/p AICD placement,ICM/systolic HF, AFib, DM2,  advanced CKD with RUE AVF (required HD 06/2016-10/2016 last year for ischemic ATN, nephrologist is [place]), cirrhosis, history of rectal cancer s/p resection and colostomy. Recent admission  7/9-7/17 for volume overload, diuresed with IV lasix, metolazone and milrinone until day of discharge, discharged on home dose of torsemide. Developed SOB, worsening edema and 30 lbs weight gain over 2-3 week period. Admitted 07/09/17 with respiratory failure, demand ischemia, volume overload. Mixed cardiogenic/septic shock (MSSA bacteremia). Did not require intubation. AKI on CKD. UOP trailed off despite lasix drip. CRRT initiated 8/6 via temp cath for AKI/massive volume overload..    ASSESSMENT & PLAN  1. Acute on CKD4. Anasarca - CRRT since 8/6. (temp cath 8/6) Weight down: 92k (202 lb) g (max)->82.2 kg (181lbs) today, still w/elevated CVP/anasarca. Previous dry weight assumed to be 175lbs however suspect much lower. R upper AVF patent.   1. Continue CRRT.  2. Alb quite low. Receiving last doses of albumin ordered yesterday to help mobilize extensive 3rd space fluids.  2. Mixed cardiogenic/septic shock -  MSSA bacteremia/NSTEMI. Remains on norepi. Not on inotropes.  3. MSSA Bacteremia, BL LE wounds: ID on board. Repeat BC's neg. WBC have been falling. Currently Cefazolin 2g daily. Rifampin d/c 2/2 concerns of hepatotoxicity.  1. Will eventually need TEE and removal of ICD with temp pacer and removal of HD line but not in position to do this now 4. ICM with AICD: Will need eventual extraction of ICD 5. Anemia, Iron deficiency: Hb stable low 8's, 7.9 today. Low TSat.  1. Needs IV iron replacement once infection resolved. Transfuse prn 6. Chronic Atrial Fibrillation: Off warfarin 2/2 prolonged INR  1. INR still supratherapeutic INR but improving 7. Hyponatremia: Stable on CCRT 8. Hyperbilirubinemia, hx of cirrhosis: elevated T-bili to nearly 5 with INR of 5.4 despite several days without warfarn. ?Rifampin hepatotox vs shock liver 9. Supratherapeutic INR - has not rec'd Vit K or FFP. INR now 4.8. No bleeding. 1. Monitor for bleeding. 10. DM - SSI 11. Deconditioning - Inpatient rehab  suggested  Einar Gip, DO [facility] [phone] Pager 07/17/2017, 8:34 AM   I have seen and examined this patient and agree with plan and assessment in the above note with renal recommendations/intervention highlighted. Continuing with volume removal with CRRT. Weight coming down, still has anasarca,stable electrolytes. norepi weaning some. Not on inotropes. Minimal UOP (160/24 hours).  Dominic Mahaney B,MD 07/17/2017 9:50 AM

## 2017-07-17 NOTE — Progress Notes (Signed)
Patient ID: [name], male   DOB: 10-Nov-1956, 61 y.o.   MRN: [id number]     Advanced Heart Failure Rounding Note  Primary Cardiologist: Bensimhon  Subjective:    Admitted with fatigue, dyspnea, recurrent lower extremity swelling and decreased UOP, cardiogenic shock.   Remains on CRRT and norepi 8 mcg. BP stable.  UF 100 per hour still. UOP 80 cc/24 hrs. CVP 12-13.  Weight down about 2 lbs over the last day.   No dyspnea, feeling somewhat better overall.     Blood CX- 2/2 MSSA ? From LLE wounds?Marland Kitchen On cefazolin.   Objective:   Weight Range: 181 lb 3.5 oz (82.2 kg) Body mass index is 26 kg/m.   Vital Signs:   Temp:  [96.9 F (36.1 C)-97.9 F (36.6 C)] 97.5 F (36.4 C) (08/12 0819) Pulse Rate:  [69-78] 74 (08/12 0900) Resp:  [8-20] 11 (08/12 0900) BP: (80-116)/(41-70) 97/46 (08/12 0900) SpO2:  [92 %-100 %] 100 % (08/12 0900) Weight:  [181 lb 3.5 oz (82.2 kg)] 181 lb 3.5 oz (82.2 kg) (08/12 0630) Last BM Date: 07/13/17 (ostomy moved, bag changed)  Weight change: Filed Weights   07/15/17 0500 07/16/17 0630 07/17/17 0630  Weight: 185 lb 3 oz (84 kg) 183 lb 13.8 oz (83.4 kg) 181 lb 3.5 oz (82.2 kg)    Intake/Output:   Intake/Output Summary (Last 24 hours) at 07/17/17 0917 Last data filed at 07/17/17 0900  Gross per 24 hour  Intake           576.96 ml  Output             2552 ml  Net         -1975.04 ml      Physical Exam  CVP 12-13 General: NAD, chronically ill-appearing Neck: JVP 12 cm, no thyromegaly or thyroid nodule.  Lungs: Clear to auscultation bilaterally with normal respiratory effort. CV: Nondisplaced PMI.  Heart regular S1/S2, no S3/S4, 2/6 HSM LLSB.  1+ edema to thighs.   Abdomen: Soft, nontender, no hepatosplenomegaly, no distention.  Skin: Intact without lesions or rashes.  Neurologic: Alert and oriented x 3.  Psych: Normal affect. Extremities: No clubbing or cyanosis.  HEENT: Normal.   Telemetry   BiV pacing 70s, underlying atrial  fibrillation.   Personally reviewed.   Labs    CBC  Recent Labs  07/16/17 0324 07/17/17 0326  WBC 14.3* 15.4*  HGB 8.2* 7.9*  HCT 27.0* 25.4*  MCV 93.8 93.7  PLT 164 947*   Basic Metabolic Panel  Recent Labs  07/16/17 0324 07/16/17 1735 07/17/17 0326  NA 132* 131* 132*  K 4.5 4.6 4.3  CL 98* 99* 99*  CO2 24 27 26   GLUCOSE 189* 218* 214*  BUN 26* 23* 22*  CREATININE 1.30* 1.25* 1.27*  CALCIUM 8.2* 8.3* 8.4*  MG 2.5*  --  2.6*  PHOS 2.9 2.6 2.5   Liver Function Tests  Recent Labs  07/15/17 0350  07/16/17 1735 07/17/17 0326  AST 40  --   --   --   ALT 7*  --   --   --   ALKPHOS 147*  --   --   --   BILITOT 3.6*  --   --   --   PROT 6.5  --   --   --   ALBUMIN 1.7*  < > 1.9* 2.3*  < > = values in this interval not displayed. No results for input(s): LIPASE, AMYLASE in the last 72  hours. Cardiac Enzymes No results for input(s): CKTOTAL, CKMB, CKMBINDEX, TROPONINI in the last 72 hours.  BNP: BNP (last 3 results)  Recent Labs  06/13/17 1205 07/09/17 2151  BNP 1,151.0* 1,553.9*    ProBNP (last 3 results) No results for input(s): PROBNP in the last 8760 hours.   D-Dimer No results for input(s): DDIMER in the last 72 hours. Hemoglobin A1C No results for input(s): HGBA1C in the last 72 hours. Fasting Lipid Panel No results for input(s): CHOL, HDL, LDLCALC, TRIG, CHOLHDL, LDLDIRECT in the last 72 hours. Thyroid Function Tests No results for input(s): TSH, T4TOTAL, T3FREE, THYROIDAB in the last 72 hours.  Invalid input(s): FREET3  Other results:   Imaging    No results found.   Medications:     Scheduled Medications: . Chlorhexidine Gluconate Cloth  6 each Topical Daily  . colchicine  0.3 mg Oral Daily  . insulin aspart  0-5 Units Subcutaneous QHS  . insulin aspart  0-9 Units Subcutaneous TID WC  . insulin aspart  3 Units Subcutaneous TID WC  . sodium chloride flush  10-40 mL Intracatheter Q12H    Infusions: . sodium chloride  Stopped (07/15/17 1500)  .  ceFAZolin (ANCEF) IV Stopped (07/17/17 0542)  . heparin    . norepinephrine (LEVOPHED) Adult infusion 8 mcg/min (07/17/17 0900)  . dialysis replacement fluid (prismasate) 300 mL/hr at 07/17/17 0330  . dialysis replacement fluid (prismasate) 200 mL/hr at 07/16/17 0250  . dialysate (PRISMASATE) 1,000 mL/hr at 07/17/17 0826    PRN Medications: sodium chloride, acetaminophen, alum & mag hydroxide-simeth, fentaNYL (SUBLIMAZE) injection, heparin, heparin, heparin, HYDROcodone-acetaminophen, magic mouthwash w/lidocaine, ondansetron (ZOFRAN) IV, sodium chloride flush    Patient Profile   [name] is a 61 year old with a history of DMII, [place] 2016, rectal cancer, CKD Stage IV, chronic A fib, S/P AVN ablation, PAF, CAD BMS LAD 2011 BMS LAD 2009, OSA, chronic systolic heart failure, St Jude BiV ICD admitted with AKI and shock with elevated lactate, suspect cardiogenic.   Assessment/Plan   1. Acute on chronic systolic CHF/cardiogenic shock: Last echo in 8/17 with EF 15-20%.  Ischemic cardiomyopathy, has St Jude BiV ICD (despite chronic afib appears to be effectively BiV pacing).  Had recent admission (discharged 06/21/17) requiring milrinone support and diuresis.  Admitted with soft BP (SBP 80s-90s) + elevated lactate.  Suspect mixed septic/cardiogenic shock with MSSA bacteremia.  CVVH begun and ongoing.  Weight coming down.  Still with CVP 12-13, volume overloaded on exam. Co-ox adequate at 65%.  - Continue CVVH per renal, UF 100 cc/hr.   - Continue norepinephrine, titrate down as able, currently at 8.  2. AKI on CKD stage 4: Baseline creatinine around 2.5.  He required brief HD in the past, has RUE fistula. - Started CRRT 8/6. Nephrology appreciated. Long-term HD would be difficult.  3. Atrial fibrillation: Chronic.  Warfarin on hold, INR 5.23 => 4.83.  4. CAD: No CP. Hold statin for now. Hold aspirin.   5. ID: WBC trend 29>24>18.6>14>15.  Blood CX 2/2 MSSA. Antibiotics  narrowed, now on Ancef. 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  - EP consulted. Will eventually need extraction (and temporary-permanent PPM). Dr Lovena Le is on vacation so he would need transfer [facility] if needs soon.  - Will try to arrange TEE for Monday.   6. H/O Rectal CA with diverting colostomy  7. Type II diabetes: Sliding scale.  8. Liver failure: LFTs improving  Statin stopped.    Length of Stay:  [place], MD  07/17/2017, 9:17 AM  Advanced Heart Failure Team Pager [phone] (M-F; 7a - 4p)  Please contact [facility] for night-coverage after hours (4p -7a ) and weekends on [email or web address]

## 2017-07-18 DIAGNOSIS — A4101 Sepsis due to Methicillin susceptible Staphylococcus aureus: Principal | ICD-10-CM

## 2017-07-18 LAB — GLUCOSE, CAPILLARY
GLUCOSE-CAPILLARY: 173 mg/dL — AB (ref 65–99)
GLUCOSE-CAPILLARY: 155 mg/dL — AB (ref 65–99)
GLUCOSE-CAPILLARY: 149 mg/dL — AB (ref 65–99)
Glucose-Capillary: 219 mg/dL — ABNORMAL HIGH (ref 65–99)
Glucose-Capillary: 188 mg/dL — ABNORMAL HIGH (ref 65–99)
Glucose-Capillary: 163 mg/dL — ABNORMAL HIGH (ref 65–99)

## 2017-07-18 LAB — RENAL FUNCTION PANEL
ALBUMIN: 2.4 g/dL — AB (ref 3.5–5.0)
ANION GAP: 6 (ref 5–15)
ANION GAP: 7 (ref 5–15)
Albumin: 2.2 g/dL — ABNORMAL LOW (ref 3.5–5.0)
BUN: 20 mg/dL (ref 6–20)
BUN: 19 mg/dL (ref 6–20)
CALCIUM: 8.3 mg/dL — AB (ref 8.9–10.3)
CHLORIDE: 99 mmol/L — AB (ref 101–111)
CO2: 27 mmol/L (ref 22–32)
CO2: 26 mmol/L (ref 22–32)
CREATININE: 1.26 mg/dL — AB (ref 0.61–1.24)
Calcium: 8 mg/dL — ABNORMAL LOW (ref 8.9–10.3)
Chloride: 99 mmol/L — ABNORMAL LOW (ref 101–111)
Creatinine, Ser: 1.3 mg/dL — ABNORMAL HIGH (ref 0.61–1.24)
GFR calc non Af Amer: 57 mL/min — ABNORMAL LOW (ref >60)
GFR calc non Af Amer: 59 mL/min — ABNORMAL LOW (ref >60)
GLUCOSE: 175 mg/dL — AB (ref 65–99)
Glucose, Bld: 172 mg/dL — ABNORMAL HIGH (ref 65–99)
PHOSPHORUS: 2.7 mg/dL (ref 2.5–4.6)
POTASSIUM: 4.5 mmol/L (ref 3.5–5.1)
Phosphorus: 2.4 mg/dL — ABNORMAL LOW (ref 2.5–4.6)
Potassium: 4.3 mmol/L (ref 3.5–5.1)
SODIUM: 132 mmol/L — AB (ref 135–145)
SODIUM: 132 mmol/L — AB (ref 135–145)

## 2017-07-18 LAB — COOXEMETRY PANEL
CARBOXYHEMOGLOBIN: 2.2 % — AB (ref 0.5–1.5)
Methemoglobin: 1.1 % (ref 0.0–1.5)
O2 SAT: 63.2 %
TOTAL HEMOGLOBIN: 6.6 g/dL — AB (ref 12.0–16.0)

## 2017-07-18 LAB — CBC
HCT: 24.5 % — ABNORMAL LOW (ref 39.0–52.0)
HEMOGLOBIN: 7.6 g/dL — AB (ref 13.0–17.0)
MCH: 29.2 pg (ref 26.0–34.0)
MCHC: 31 g/dL (ref 30.0–36.0)
MCV: 94.2 fL (ref 78.0–100.0)
Platelets: 121 10*3/uL — ABNORMAL LOW (ref 150–400)
RBC: 2.6 MIL/uL — ABNORMAL LOW (ref 4.22–5.81)
RDW: 17.4 % — ABNORMAL HIGH (ref 11.5–15.5)
WBC: 14.2 10*3/uL — AB (ref 4.0–10.5)

## 2017-07-18 LAB — PROTIME-INR
INR: 3.5
PROTHROMBIN TIME: 36 s — AB (ref 11.4–15.2)

## 2017-07-18 LAB — MAGNESIUM: Magnesium: 2.3 mg/dL (ref 1.7–2.4)

## 2017-07-18 NOTE — Progress Notes (Signed)
[place] for heparin Indication: atrial fibrillation  No Known Allergies  Patient Measurements: Height: 5\' 10"  (177.8 cm) Weight: 183 lb 10.3 oz (83.3 kg) IBW/kg (Calculated) : 73  Heparin dosing weight: 90.6 kg  Vital Signs: Temp: 97.8 F (36.6 C) (08/13 0330) Temp Source: Oral (08/13 0330) BP: 94/45 (08/13 0700) Pulse Rate: 75 (08/13 0700)  Labs:  Recent Labs  07/16/17 0324 07/16/17 0744  07/17/17 0326 07/17/17 0651 07/17/17 1600 07/18/17 0339  HGB 8.2*  --   --  7.9*  --   --  7.6*  HCT 27.0*  --   --  25.4*  --   --  24.5*  PLT 164  --   --  124*  --   --  121*  LABPROT  --  47.8*  --   --  46.5*  --  36.0*  INR  --  5.23*  --   --  4.83*  --  3.50  CREATININE 1.30*  --   < > 1.27*  --  1.26* 1.30*  < > = values in this interval not displayed.  Estimated Creatinine Clearance: 60.8 mL/min (A) (by C-G formula based on SCr of 1.3 mg/dL (H)).  Assessment: 61 year old male with chronic afib on warfarin prior to admission. Patient recently seen in clinic with supratherapeutic INR 3.7 dose reduced to 1mg  of coumadin daily except 2mg  on Tuesday, Thursday, Saturday.  Anticoagulation: Planning to hold warfarin and begin heparin when INR eventually trends down, remains elevated at 3.5 this morning continues to trend down. Hgb down to 7.6 although no overt bleeding noted.  Bacteremia: Possible TEE today if can schedule. No change in ancef as he continues on CRRT. Repeat cultures on 8/6 have been negative.  Goal of Therapy:  Heparin level goal 0.3-0.7 INR 2-3 Monitor platelets by anticoagulation protocol: Yes   Plan:  -Begin heparin when INR < 2 -Can likely begin at 1300 units/hr, order not placed yet -Follow daily INR for now -Continue ancef at current dosing for now  Erin Hearing PharmD., BCPS Clinical Pharmacist Pager [phone] 07/18/2017 8:06 AM

## 2017-07-18 NOTE — Progress Notes (Signed)
PULMONARY / CRITICAL CARE MEDICINE   Name: [name] MRN: [id number] DOB: 07-12-1956    ADMISSION DATE:  07/09/2017   CONSULTATION DATE:  07/10/2017  REFERRING MD:  Sarajane Jews  CHIEF COMPLAINT:  dyspnea  HISTORY OF PRESENT ILLNESS:  61 y.o. male with history of ischemic cardiomyopathy status post ICD placement. Also history of chronic renal failure. Admitted on 8/4 with acute respiratory failure secondary to pulmonary edema. Subsequently found to have a MSSA bacteremia. Patient has had persistent shock requiring vasopressor support and is on renal replacement therapy.   SUBJECTIVE:  TEE rescheduled for tomorrow. No chest pain or pressure. No dyspnea or cough.  REVIEW OF SYSTEMS:  Denies subjective fever or chills. No abdominal pain or nausea. Still having some hip pain.  VITAL SIGNS: BP (!) 102/52 (BP Location: Left Arm)   Pulse 73   Temp 97.8 F (36.6 C) (Oral)   Resp 18   Ht 5\' 10"  (1.778 m)   Wt 183 lb 10.3 oz (83.3 kg)   SpO2 98%   BMI 26.35 kg/m   HEMODYNAMICS: CVP:  [9 mmHg-14 mmHg] 11 mmHg  VENTILATOR SETTINGS:    INTAKE / OUTPUT: I/O last 3 completed shifts: In: 1769.3 [P.O.:1140; I.V.:229.3; IV Piggyback:400] Out: 4332 [Urine:275; RJJOA:4166]  PHYSICAL EXAMINATION: General:  Wife at bedside. Awake. No distress. Integument:  Warm & dry. No rash on exposed skin. HEENT:  Dry mucous membranes. No scleral icterus or injection. Cardiovascular:  Regular rate. Paced rhythm on telemetry. No appreciable JVD. Pulmonary: Normal work of breathing on room air. Clear to auscultation. Abdomen: Soft. Nontender. Normal bowel sounds. Neurological: Oriented 4. Grossly nonfocal. No meningismus.  LABS:  BMET  Recent Labs Lab 07/17/17 0326 07/17/17 1600 07/18/17 0339  NA 132* 132* 132*  K 4.3 4.6 4.5  CL 99* 99* 99*  CO2 26 27 27   BUN 22* 20 20  CREATININE 1.27* 1.26* 1.30*  GLUCOSE 214* 246* 172*    Electrolytes  Recent Labs Lab 07/16/17 0324   07/17/17 0326 07/17/17 1600 07/18/17 0339  CALCIUM 8.2*  < > 8.4* 8.1* 8.3*  MG 2.5*  --  2.6*  --  2.3  PHOS 2.9  < > 2.5 2.6 2.7  < > = values in this interval not displayed.  CBC  Recent Labs Lab 07/16/17 0324 07/17/17 0326 07/18/17 0339  WBC 14.3* 15.4* 14.2*  HGB 8.2* 7.9* 7.6*  HCT 27.0* 25.4* 24.5*  PLT 164 124* 121*    Coag's  Recent Labs Lab 07/16/17 0744 07/17/17 0651 07/18/17 0339  INR 5.23* 4.83* 3.50    Sepsis Markers  Recent Labs Lab 07/12/17 0426  PROCALCITON 2.78    ABG No results for input(s): PHART, PCO2ART, PO2ART in the last 168 hours.  Liver Enzymes  Recent Labs Lab 07/12/17 1110  07/13/17 0825  07/15/17 0350  07/17/17 0326 07/17/17 1600 07/18/17 0339  AST 34  --  40  --  40  --   --   --   --   ALT 8*  --  7*  --  7*  --   --   --   --   ALKPHOS 126  --  143*  --  147*  --   --   --   --   BILITOT 4.7*  --  4.8*  --  3.6*  --   --   --   --   ALBUMIN 2.0*  < > 1.9*  < > 1.7*  < > 2.3* 2.3* 2.4*  < > =  values in this interval not displayed.  Cardiac Enzymes No results for input(s): TROPONINI, PROBNP in the last 168 hours.  Glucose  Recent Labs Lab 07/16/17 2106 07/17/17 0809 07/17/17 1235 07/17/17 1611 07/17/17 2117 07/18/17 0826  GLUCAP 220* 219* 206* 266* 258* 155*    Imaging No results found.   IMAGING/STUDIES: CT ABD/PELVIS W/O 8/5: IMPRESSION: 1. Cirrhosis. Moderate ascites. Anasarca. 2. Diverting colostomy without bowel obstruction. Similar thickened rectum compatible with treatment related changes. 3. Mild cardiomegaly. Moderate RIGHT and small LEFT pleural effusions. TTE 8/5:  LV mildly dilated with mild concentric hypertrophy. EF 15-20% with diffuse hypokinesis and apical akinesis. Not sufficient to allow assessment of diastolic function. LA mildly dilated & RA normal in size. RV normal in size and function. Pacer wire noted within right ventricle. No aortic stenosis with moderate regurgitation.  Aortic root normal in size. Mild mitral regurgitation without stenosis. Flattening of the intraventricular septum consistent with right ventricular pressure and volume overload. Mild pulmonic regurgitation without stenosis. Severe tricuspid regurgitation. No pericardial effusion. Study showed no vegetations but image quality was poor and study was technically difficult. CT R HIP W/O 8/6: IMPRESSION: 1. No acute fracture nor dislocation of either hip. No evidence of AVN. Probable right-sided gluteal tendinopathy. 2. The bones are demineralized in appearance. There is generalized muscle atrophy about the right hip. 3. Anasarca with moderate abdominopelvic ascites. 4. Moderate to large bilateral hydroceles. PORT CXR 8/6:  Personally reviewed by me. Right internal jugular central venous catheter into position. Stable volume loss on the right. No new focal opacity. Implanted device within the left chest wall with leads projecting to the heart.  MICROBIOLOGY: Blood Cultures x2 7/7:  Negative  MRSA PCR 8/5: Negative  Blood Cultures x2 8/4:  2/2 Bottles MSSA Urine Culture 8/4:  Negative  Blood Cultures x2 8/6:  Negative   ANTIBIOTICS: Vancomycin 8/5 Rifampin 8/6 - 8/7 Ancef 8/6 >>>  SIGNIFICANT EVENTS: 08/04 - Admit 08/05 - Started on CVVHD  LINES/TUBES: R IJ TEMP HD CATHETER 8/5 >>> Foley >>> PIV  ASSESSMENT / PLAN:  PULMONARY A: Acute hypoxic respiratory failure: Resolved. Pulmonary hypertension: Noted on echocardiogram. Likely multifactorial. History of OSA  P:   Continuous pulse oximetry monitoring CPAP daily at bedtime  CARDIOVASCULAR A:  Ischemic cardiomyopathy: Known history of coronary artery disease. Shock: Combination sepsis and cardiogenic. Status post ICD: Currently with bacteremia. Paroxysmal atrial fibrillation  P:  Continuous telemetry monitoring Weaning Levophed for MAP >65 Monitoring vital signs per unit protocol Transesophageal echocardiogram  pending Cardiology following & appreciate recommendations  RENAL A:   Acute on chronic renal failure: Right arm fistula in place. Unknown renal state.  P:   Nephrology following & appreciate recommendations Trending electrolytes every 12 hours Continuing renal replacement therapy per nephrology  GASTROINTESTINAL A:   History of rectal cancer: Status post colostomy.  Liver cirrhosis  P:   Continuing diet  HEMATOLOGIC A:   Anemia: No signs of active bleeding. Hgb stable.  Coagulopathy: Multifactorial from sepsis, antibiotics, Coumadin, and liver cirrhosis. Leukocytosis: Secondary to sepsis. Steadily improving.  P:  Continuing to hold Coumadin Trending cell counts daily with CBC Trending INR daily Transfuse for Hgb <7.5 or active bleeding  INFECTIOUS A:   Sepsis MSSA bacteremia: Hemodialysis catheter placed 8/5. ICD in place. Possible source right leg wound.   P:   ID following & appreciate input Awaiting transesophageal echocardiogram Continuing antibiotics as above   ENDOCRINE A:   Hyperglycemia: Multifactorial from tube feedings and steroids. Better controlled on  Lantus.  P:   Continuing Lantus 10 units subcutaneous daily at bedtime Continuing Accu-Cheks before every meal & at bedtime Continuing sliding scale insulin per sensitive algorithm Continuing NovoLog 3 units with meals  NEUROLOGIC A:   Arthritis pain  P:   Continuing Norco when necessary  ORTHO A: Achilles Tendon Pain Arthritis/Hip Pain  P: Monitor  Prophylaxis:  SCDs & systemic anticoagulation. Diet:  Heart Healthy Carb Modified.  Code Status:  Full Code per previous physician discussions. Disposition:  Continuing to wean vasopressor support while critically ill.  Family Update:  Patient & wife updated during rounds today.  DISCUSSION:  62 y.o. male with shock that is likely multifactorial from sepsis as well as cardiogenic source. Continuing on renal replacement therapy as per  cardiology goal diuresis and nephrology. Continuing to wean vasopressor therapy as needed to maintain mean arterial pressure.  I have spent a total of 31 minutes of critical care time today caring for the patient and reviewing the patient's electronic medical record.   Sonia Baller Ashok Cordia, M.D. [facility] Pager:  [phone] After 3pm or if no response, call [phone] 07/18/2017, 10:04 AM

## 2017-07-18 NOTE — Plan of Care (Signed)
Problem: Cardiac: Goal: Cardiac symptoms related to disease process will be avoided Outcome: Progressing Pt tolerating CRRT with UF (-)100 and weaning of Levophed to keep MAP > 65

## 2017-07-18 NOTE — Procedures (Signed)
Admit: 07/09/2017 LOS: 8  [name] has been tolerating CVVHD with UF but still requiring levophed  Current CRRT Prescription: Pre-filter fluid: 4K/2.5 at 300 ml/hr Post-filter fluid: 4k/2.5 at 200 ml/hr Dialysate: BGK 4/2.5 at 1,000 mL/hr UF goal 50-100 ml/hr Heparin 1,000-6,000 units prn    S: No new complaints, still feels weak.  Still on levophed.  O: 08/12 0701 - 08/13 0700 In: 1441.4 [P.O.:1080; I.V.:161.4; IV Piggyback:200] Out: 2842 [Urine:225]  Filed Weights   07/16/17 0630 07/17/17 0630 07/18/17 0430  Weight: 83.4 kg (183 lb 13.8 oz) 82.2 kg (181 lb 3.5 oz) 83.3 kg (183 lb 10.3 oz)     Recent Labs Lab 07/17/17 0326 07/17/17 1600 07/18/17 0339  NA 132* 132* 132*  K 4.3 4.6 4.5  CL 99* 99* 99*  CO2 26 27 27   GLUCOSE 214* 246* 172*  BUN 22* 20 20  CREATININE 1.27* 1.26* 1.30*  CALCIUM 8.4* 8.1* 8.3*  PHOS 2.5 2.6 2.7    Recent Labs Lab 07/12/17 0424  07/16/17 0324 07/17/17 0326 07/18/17 0339  WBC 29.9*  < > 14.3* 15.4* 14.2*  NEUTROABS 28.1*  --   --   --   --   HGB 7.9*  < > 8.2* 7.9* 7.6*  HCT 24.2*  < > 27.0* 25.4* 24.5*  MCV 89.3  < > 93.8 93.7 94.2  PLT 172  < > 164 124* 121*  < > = values in this interval not displayed.  Scheduled Meds: . Chlorhexidine Gluconate Cloth  6 each Topical Daily  . colchicine  0.3 mg Oral Daily  . insulin aspart  0-5 Units Subcutaneous QHS  . insulin aspart  0-9 Units Subcutaneous TID WC  . insulin aspart  3 Units Subcutaneous TID WC  . insulin glargine  10 Units Subcutaneous QHS  . sodium chloride flush  10-40 mL Intracatheter Q12H   Continuous Infusions: . sodium chloride Stopped (07/15/17 1500)  .  ceFAZolin (ANCEF) IV Stopped (07/18/17 5784)  . heparin 999 mL (07/17/17 2143)  . norepinephrine (LEVOPHED) Adult infusion 6 mcg/min (07/18/17 1000)  . dialysis replacement fluid (prismasate) 300 mL/hr at 07/18/17 0507  . dialysis replacement fluid (prismasate) 200 mL/hr at 07/18/17 0507  . dialysate  (PRISMASATE) 1,000 mL/hr at 07/18/17 0912   PRN Meds:.sodium chloride, acetaminophen, alum & mag hydroxide-simeth, fentaNYL (SUBLIMAZE) injection, heparin, heparin, heparin, HYDROcodone-acetaminophen, magic mouthwash w/lidocaine, ondansetron (ZOFRAN) IV, sodium chloride flush  ABG    Component Value Date/Time   PHART 7.470 (H) 07/11/2017 0355   PCO2ART 24.9 (L) 07/11/2017 0355   PO2ART 60.0 (L) 07/11/2017 0355   HCO3 18.2 (L) 07/11/2017 0355   TCO2 19 07/11/2017 0355   ACIDBASEDEF 4.0 (H) 07/11/2017 0355   O2SAT 63.2 07/18/2017 0345    A/P  1. Dialysis dependent AKI/CKD 4- in setting of MSSA bacteremia and ischemic cardiomyopathy/hypotension/volume overload.  Admitted 07/09/17 with respiratory failuer and demand ischemia improving with initiation of CVVHD.  Continue with UF for now and hopefully can wean pressor and attempt IHD 2. Acute on chronic CHF/ischemic CMP 3. MSSA bacteremia- unclear etiology.  On abx and will need TEE when stable to r/o SBE. 4. Anemia- transfuse prn and will add ESA 5. Hyponatremia- due to CHF/anasarca improved with CVVHD and UF 6. Cirrhosis- elevated t bili and abnormal lft's off of statin and improving 7. Thrombocytopenia- may need to stop heparin, continue to follow. 8. Atrial fib- per cards    Robert Heinz, MD [place] Pager: [phone] Office [phone]

## 2017-07-18 NOTE — Progress Notes (Signed)
[name] for Infectious Disease  Date of Admission:  07/09/2017   Total days of antibiotics 9        Day 9 Cefazolin        Vancomycin 8/4        Rifampin 8/5-8/7 ASSESSMENT and PLAN:  Septic Shock  MSSA bacteremia -2/2 Blood culture and biofire both resulted for MSSA (8/7) -2/2 Blood culture showed no growth for 5 days as of 8/11 -Continue cefazolin  -Provide catheter holiday whenever possible -TEE tentative for 8/14  Right Hip Pain -The patient has not been officially diagnosed with gout. Consider d/c colchicine. Might be oa or osteoporosis. -Pain control with Norco 7.5-325mg  q4hrs prn   . Chlorhexidine Gluconate Cloth  6 each Topical Daily  . colchicine  0.3 mg Oral Daily  . insulin aspart  0-5 Units Subcutaneous QHS  . insulin aspart  0-9 Units Subcutaneous TID WC  . insulin aspart  3 Units Subcutaneous TID WC  . insulin glargine  10 Units Subcutaneous QHS  . sodium chloride flush  10-40 mL Intracatheter Q12H    SUBJECTIVE: [name] was seen resting in his bed this morning eating. He states that he is feeling better. He appeared to be less lethargic. He denied having any sob or chest pain. He stated that he continues to still have some mild pain in his right leg, especially when he is moved around.   Review of Systems: ROS as above  No Known Allergies  OBJECTIVE: Vitals:   07/18/17 0600 07/18/17 0630 07/18/17 0700 07/18/17 0800  BP: 105/60 (!) 101/55 (!) 94/45 (!) 102/52  Pulse: 74 73 75 73  Resp: 14 14 17 18   Temp:    97.8 F (36.6 C)  TempSrc:    Oral  SpO2: 98% 94% 96% 98%  Weight:      Height:       Body mass index is 26.35 kg/m.  Physical Exam  Constitutional: He appears not jaundiced. He appears unhealthy. No distress.  HENT:  Head: Normocephalic and atraumatic.  Cardiovascular: Normal rate, regular rhythm, normal heart sounds and intact distal pulses.   Pulmonary/Chest: Effort normal and breath sounds normal. No respiratory  distress. He has no wheezes.  Abdominal: Soft. He exhibits distension.  Musculoskeletal:       Right hip: He exhibits decreased range of motion, decreased strength and tenderness (mild). He exhibits no swelling and no deformity.    Lab Results Lab Results  Component Value Date   WBC 14.2 (H) 07/18/2017   HGB 7.6 (L) 07/18/2017   HCT 24.5 (L) 07/18/2017   MCV 94.2 07/18/2017   PLT 121 (L) 07/18/2017    Lab Results  Component Value Date   CREATININE 1.30 (H) 07/18/2017   BUN 20 07/18/2017   NA 132 (L) 07/18/2017   K 4.5 07/18/2017   CL 99 (L) 07/18/2017   CO2 27 07/18/2017    Lab Results  Component Value Date   ALT 7 (L) 07/15/2017   AST 40 07/15/2017   ALKPHOS 147 (H) 07/15/2017   BILITOT 3.6 (H) 07/15/2017     Microbiology: Recent Results (from the past 240 hour(s))  Blood culture (routine x 2)     Status: Abnormal   Collection Time: 07/09/17 10:04 PM  Result Value Ref Range Status   Specimen Description BLOOD LEFT HAND  Final   Special Requests IN PEDIATRIC BOTTLE Blood Culture adequate volume  Final   Culture  Setup Time  Final    GRAM POSITIVE COCCI IN CLUSTERS IN PEDIATRIC BOTTLE CRITICAL RESULT CALLED TO, READ BACK BY AND VERIFIED WITH: R RUMBARGER,PHARMD AT 1613 07/10/17 BY L BENFIELD    Culture (A)  Final    STAPHYLOCOCCUS AUREUS SUSCEPTIBILITIES PERFORMED ON PREVIOUS CULTURE WITHIN THE LAST 5 DAYS.    Report Status 07/12/2017 FINAL  Final  Blood Culture ID Panel (Reflexed)     Status: Abnormal   Collection Time: 07/09/17 10:04 PM  Result Value Ref Range Status   Enterococcus species NOT DETECTED NOT DETECTED Final   Listeria monocytogenes NOT DETECTED NOT DETECTED Final   Staphylococcus species DETECTED (A) NOT DETECTED Final    Comment: CRITICAL RESULT CALLED TO, READ BACK BY AND VERIFIED WITH: R RUMBARGER,PHARMD AT 1613 07/10/17 BY L BENFIELD    Staphylococcus aureus DETECTED (A) NOT DETECTED Final    Comment: Methicillin (oxacillin) susceptible  Staphylococcus aureus (MSSA). Preferred therapy is anti staphylococcal beta lactam antibiotic (Cefazolin or Nafcillin), unless clinically contraindicated. CRITICAL RESULT CALLED TO, READ BACK BY AND VERIFIED WITH: R RUMBARGER,PHARMD AT 1613 07/10/17 BY L BENFIELD    Methicillin resistance NOT DETECTED NOT DETECTED Final   Streptococcus species NOT DETECTED NOT DETECTED Final   Streptococcus agalactiae NOT DETECTED NOT DETECTED Final   Streptococcus pneumoniae NOT DETECTED NOT DETECTED Final   Streptococcus pyogenes NOT DETECTED NOT DETECTED Final   Acinetobacter baumannii NOT DETECTED NOT DETECTED Final   Enterobacteriaceae species NOT DETECTED NOT DETECTED Final   Enterobacter cloacae complex NOT DETECTED NOT DETECTED Final   Escherichia coli NOT DETECTED NOT DETECTED Final   Klebsiella oxytoca NOT DETECTED NOT DETECTED Final   Klebsiella pneumoniae NOT DETECTED NOT DETECTED Final   Proteus species NOT DETECTED NOT DETECTED Final   Serratia marcescens NOT DETECTED NOT DETECTED Final   Haemophilus influenzae NOT DETECTED NOT DETECTED Final   Neisseria meningitidis NOT DETECTED NOT DETECTED Final   Pseudomonas aeruginosa NOT DETECTED NOT DETECTED Final   Candida albicans NOT DETECTED NOT DETECTED Final   Candida glabrata NOT DETECTED NOT DETECTED Final   Candida krusei NOT DETECTED NOT DETECTED Final   Candida parapsilosis NOT DETECTED NOT DETECTED Final   Candida tropicalis NOT DETECTED NOT DETECTED Final  Blood culture (routine x 2)     Status: Abnormal   Collection Time: 07/09/17 10:08 PM  Result Value Ref Range Status   Specimen Description BLOOD LEFT FOREARM  Final   Special Requests   Final    BOTTLES DRAWN AEROBIC AND ANAEROBIC Blood Culture adequate volume   Culture  Setup Time   Final    GRAM POSITIVE COCCI IN CLUSTERS IN BOTH AEROBIC AND ANAEROBIC BOTTLES CRITICAL RESULT CALLED TO, READ BACK BY AND VERIFIED WITH: R RUMBARGER,PHARMD AT 1613 07/10/17 BY L BENFIELD    Culture  STAPHYLOCOCCUS AUREUS (A)  Final   Report Status 07/12/2017 FINAL  Final   Organism ID, Bacteria STAPHYLOCOCCUS AUREUS  Final      Susceptibility   Staphylococcus aureus - MIC*    CIPROFLOXACIN <=0.5 SENSITIVE Sensitive     ERYTHROMYCIN <=0.25 SENSITIVE Sensitive     GENTAMICIN <=0.5 SENSITIVE Sensitive     OXACILLIN 0.5 SENSITIVE Sensitive     TETRACYCLINE <=1 SENSITIVE Sensitive     VANCOMYCIN <=0.5 SENSITIVE Sensitive     TRIMETH/SULFA <=10 SENSITIVE Sensitive     CLINDAMYCIN <=0.25 SENSITIVE Sensitive     RIFAMPIN <=0.5 SENSITIVE Sensitive     Inducible Clindamycin NEGATIVE Sensitive     * STAPHYLOCOCCUS AUREUS  Urine  culture     Status: None   Collection Time: 07/09/17 10:15 PM  Result Value Ref Range Status   Specimen Description URINE, CATHETERIZED  Final   Special Requests NONE  Final   Culture NO GROWTH  Final   Report Status 07/11/2017 FINAL  Final  MRSA PCR Screening     Status: None   Collection Time: 07/10/17  2:48 AM  Result Value Ref Range Status   MRSA by PCR NEGATIVE NEGATIVE Final    Comment:        The GeneXpert MRSA Assay (FDA approved for NASAL specimens only), is one component of a comprehensive MRSA colonization surveillance program. It is not intended to diagnose MRSA infection nor to guide or monitor treatment for MRSA infections.   Culture, blood (Routine X 2) w Reflex to ID Panel     Status: None   Collection Time: 07/11/17  4:22 PM  Result Value Ref Range Status   Specimen Description BLOOD BLOOD LEFT HAND  Final   Special Requests   Final    BOTTLES DRAWN AEROBIC ONLY Blood Culture adequate volume   Culture NO GROWTH 5 DAYS  Final   Report Status 07/16/2017 FINAL  Final  Culture, blood (Routine X 2) w Reflex to ID Panel     Status: None   Collection Time: 07/11/17  4:25 PM  Result Value Ref Range Status   Specimen Description BLOOD BLOOD LEFT HAND  Final   Special Requests   Final    BOTTLES DRAWN AEROBIC ONLY Blood Culture adequate  volume   Culture NO GROWTH 5 DAYS  Final   Report Status 07/16/2017 FINAL  Final    [name], [place] [facility] [phone] pager   [phone] cell 07/18/2017, 10:23 AM

## 2017-07-18 NOTE — Progress Notes (Signed)
Inpatient Diabetes Program Recommendations  AACE/ADA: New Consensus Statement on Inpatient Glycemic Control (2015)  Target Ranges:  Prepandial:   less than 140 mg/dL      Peak postprandial:   less than 180 mg/dL (1-2 hours)      Critically ill patients:  140 - 180 mg/dL   Results for JANTZEN, PILGER (MRN [id number]) as of 07/18/2017 10:22  Ref. Range 07/17/2017 08:09 07/17/2017 12:35 07/17/2017 16:11 07/17/2017 21:17 07/18/2017 08:26  Glucose-Capillary Latest Ref Range: 65 - 99 mg/dL 219 (H)  Novolog 6 units 206 (H)  Novolog 6 units 266 (H)  Novolog 8 units 258 (H)  Novolog 3 units  Lantus 10 units 155 (H)   Review of Glycemic Control  Current orders for Inpatient glycemic control: Lantus 10 units QHS, Novolog 0-9 units TID with meals, Novolog 0-5 units QHS, Novolog 3 units TID with meals  Inpatient Diabetes Program Recommendations: Insulin - Meal Coverage: Post prandial glucose remains consistently elevated despite Novolog 3 units with meals. Please consider increasing meal coverage to 8 units TID with meals.  Thanks, Barnie Alderman, RN, MSN, CDE Diabetes Coordinator Inpatient Diabetes Program [phone] (Team Pager from 8am to 5pm)

## 2017-07-18 NOTE — Progress Notes (Signed)
Patient refuses the use of CPAP 

## 2017-07-18 NOTE — Progress Notes (Signed)
Physical Therapy Treatment Patient Details Name: [name] MRN: [id number] DOB: 1956/03/16 Today's Date: 07/18/2017    History of Present Illness 61 yr old male with extensive PMHx MI, ICM, HFrEF EF15%-20% ,BiVICD, CKD Stage IV, DM, GERD, OSA on CPAP, PAF, presents with weight gain, lower extremity swelling and decreased urine output within the last week.   Work up included AOCsHF/cardiogenic shock, BiV MSSA, demand ischemia.  Pt continues on CRRT.    PT Comments    Patient tolerated stand pivot transfer to recliner with mod A to stand and min A for pivotal steps. RN present and managed CRRT lines during transfer. +2 for safety. Pt limited by R hip pain in standing. Continue to progress as tolerated.    Follow Up Recommendations  CIR;Supervision/Assistance - 24 hour     Equipment Recommendations  Other (comment) (TBA)    Recommendations for Other Services Rehab consult     Precautions / Restrictions Precautions Precautions: Fall Precaution Comments: Watch BP    Mobility  Bed Mobility Overal bed mobility: Needs Assistance Bed Mobility: Supine to Sit   Sidelying to sit: Mod assist       General bed mobility comments: cues for sequencing and technique; assist to bring hips toward EOB and then to elevate trunk into sitting  Transfers Overall transfer level: Needs assistance Equipment used: Rolling walker (2 wheeled) Transfers: Sit to/from [facility] Sit to Stand: Mod assist Stand pivot transfers: Min assist       General transfer comment: cues for safe hand placement and technique; assist to power up into standing and then to steady and manage RW while pivoiting; cues for posture and proximity of RW while turning; pt able to march in place but limited by R hip pain   Ambulation/Gait             General Gait Details: not able today   Stairs            Wheelchair Mobility    Modified Rankin (Stroke Patients Only)        Balance Overall balance assessment: Needs assistance Sitting-balance support: Bilateral upper extremity supported;Feet supported Sitting balance-Leahy Scale: Poor     Standing balance support: Bilateral upper extremity supported Standing balance-Leahy Scale: Poor                              Cognition Arousal/Alertness: Awake/alert Behavior During Therapy: WFL for tasks assessed/performed Overall Cognitive Status: Within Functional Limits for tasks assessed                                        Exercises      General Comments General comments (skin integrity, edema, etc.): BP in supine 91/51 (64) and in sitting 84/57 (67); RN present during session and managed CRRT lines      Pertinent Vitals/Pain Pain Assessment: Faces Faces Pain Scale: Hurts even more Pain Location: R hip with mobility/weightbearing Pain Descriptors / Indicators: Aching;Grimacing;Guarding Pain Intervention(s): Limited activity within patient's tolerance;Monitored during session;Repositioned    Home Living                      Prior Function            PT Goals (current goals can now be found in the care plan section) Acute Rehab PT Goals Patient Stated Goal:  be able to deer hunt and fish Progress towards PT goals: Progressing toward goals    Frequency    Min 3X/week      PT Plan Current plan remains appropriate    Co-evaluation              AM-PAC PT "6 Clicks" Daily Activity  Outcome Measure  Difficulty turning over in bed (including adjusting bedclothes, sheets and blankets)?: Total Difficulty moving from lying on back to sitting on the side of the bed? : Total Difficulty sitting down on and standing up from a chair with arms (e.g., wheelchair, bedside commode, etc,.)?: Total Help needed moving to and from a bed to chair (including a wheelchair)?: A Lot Help needed walking in hospital room?: A Lot Help needed climbing 3-5 steps with a  railing? : A Lot 6 Click Score: 9    End of Session Equipment Utilized During Treatment: Oxygen;Gait belt Activity Tolerance: Patient tolerated treatment well Patient left: in chair;with call bell/phone within reach;with nursing/sitter in room Nurse Communication: Mobility status PT Visit Diagnosis: Other abnormalities of gait and mobility (R26.89);Muscle weakness (generalized) (M62.81);Pain Pain - Right/Left: Right Pain - part of body: Hip     Time: 1010-1034 PT Time Calculation (min) (ACUTE ONLY): 24 min  Charges:  $Therapeutic Activity: 23-37 mins                    G Codes:       Earney Navy, PTA Pager: [phone]     Darliss Cheney 07/18/2017, 12:02 PM

## 2017-07-18 NOTE — Progress Notes (Addendum)
Patient ID: [name], male   DOB: 1956-11-29, 61 y.o.   MRN: [id number]     Advanced Heart Failure Rounding Note  Primary Cardiologist: Shellsea Borunda  Subjective:    Admitted with fatigue, dyspnea, recurrent lower extremity swelling and decreased UOP, cardiogenic shock.   Remains on CRT with UF ~ 100 per hour. On 4 of  Norepi, down from 6 overnight.  still. Only 225 cc UOP x 24 hrs. CVP remains 10-12 range. Weight shows up 2 lbs.   Feeling ok this am. Denies SOB. Denies lightheadedness or dizziness, but in bed.    Blood CX- 2/2 MSSA ? From LLE wounds?Marland Kitchen On cefazolin.   Objective:   Weight Range: 183 lb 10.3 oz (83.3 kg) Body mass index is 26.35 kg/m.   Vital Signs:   Temp:  [97.4 F (36.3 C)-97.9 F (36.6 C)] 97.8 F (36.6 C) (08/13 0330) Pulse Rate:  [47-81] 75 (08/13 0700) Resp:  [7-23] 17 (08/13 0700) BP: (87-124)/(45-94) 94/45 (08/13 0700) SpO2:  [92 %-100 %] 96 % (08/13 0700) Weight:  [183 lb 10.3 oz (83.3 kg)] 183 lb 10.3 oz (83.3 kg) (08/13 0430) Last BM Date: 07/13/17 (ostomy moved, bag changed)  Weight change: Filed Weights   07/16/17 0630 07/17/17 0630 07/18/17 0430  Weight: 183 lb 13.8 oz (83.4 kg) 181 lb 3.5 oz (82.2 kg) 183 lb 10.3 oz (83.3 kg)    Intake/Output:   Intake/Output Summary (Last 24 hours) at 07/18/17 0755 Last data filed at 07/18/17 0700  Gross per 24 hour  Intake          1441.38 ml  Output             2842 ml  Net         -1400.62 ml      Physical Exam  CVP 11-12 General: Chronically ill appearing. Color improved. No resp difficulty. HEENT: Normal Neck: Supple. JVP 11-12 cm. Carotids 2+ bilat; no bruits. No thyromegaly or nodule noted. Cor: PMI nondisplaced. RRR, 2/6 HSM LLSB.  Lungs: CTAB, normal effort. Abdomen: Soft, non-tender, non-distended, no HSM. No bruits or masses. +BS  Extremities: No cyanosis, clubbing, or rash. Trace to 1+ edema to knees.  Neuro: Alert & orientedx3, cranial nerves grossly intact. moves all 4  extremities w/o difficulty. Affect pleasant   Telemetry   Personally reviewed, BiV pacing 70s, underlying Afib.   Labs    CBC  Recent Labs  07/17/17 0326 07/18/17 0339  WBC 15.4* 14.2*  HGB 7.9* 7.6*  HCT 25.4* 24.5*  MCV 93.7 94.2  PLT 124* 884*   Basic Metabolic Panel  Recent Labs  07/17/17 0326 07/17/17 1600 07/18/17 0339  NA 132* 132* 132*  K 4.3 4.6 4.5  CL 99* 99* 99*  CO2 26 27 27   GLUCOSE 214* 246* 172*  BUN 22* 20 20  CREATININE 1.27* 1.26* 1.30*  CALCIUM 8.4* 8.1* 8.3*  MG 2.6*  --  2.3  PHOS 2.5 2.6 2.7   Liver Function Tests  Recent Labs  07/17/17 1600 07/18/17 0339  ALBUMIN 2.3* 2.4*   No results for input(s): LIPASE, AMYLASE in the last 72 hours. Cardiac Enzymes No results for input(s): CKTOTAL, CKMB, CKMBINDEX, TROPONINI in the last 72 hours.  BNP: BNP (last 3 results)  Recent Labs  06/13/17 1205 07/09/17 2151  BNP 1,151.0* 1,553.9*    ProBNP (last 3 results) No results for input(s): PROBNP in the last 8760 hours.   D-Dimer No results for input(s): DDIMER in the last 72 hours.  Hemoglobin A1C No results for input(s): HGBA1C in the last 72 hours. Fasting Lipid Panel No results for input(s): CHOL, HDL, LDLCALC, TRIG, CHOLHDL, LDLDIRECT in the last 72 hours. Thyroid Function Tests No results for input(s): TSH, T4TOTAL, T3FREE, THYROIDAB in the last 72 hours.  Invalid input(s): FREET3  Other results:   Imaging   No results found.  Medications:     Scheduled Medications: . Chlorhexidine Gluconate Cloth  6 each Topical Daily  . colchicine  0.3 mg Oral Daily  . insulin aspart  0-5 Units Subcutaneous QHS  . insulin aspart  0-9 Units Subcutaneous TID WC  . insulin aspart  3 Units Subcutaneous TID WC  . insulin glargine  10 Units Subcutaneous QHS  . sodium chloride flush  10-40 mL Intracatheter Q12H    Infusions: . sodium chloride Stopped (07/15/17 1500)  .  ceFAZolin (ANCEF) IV Stopped (07/18/17 9417)  . heparin  999 mL (07/17/17 2143)  . norepinephrine (LEVOPHED) Adult infusion 6 mcg/min (07/18/17 0700)  . dialysis replacement fluid (prismasate) 300 mL/hr at 07/18/17 0507  . dialysis replacement fluid (prismasate) 200 mL/hr at 07/18/17 0507  . dialysate (PRISMASATE) 1,000 mL/hr at 07/18/17 0406    PRN Medications: sodium chloride, acetaminophen, alum & mag hydroxide-simeth, fentaNYL (SUBLIMAZE) injection, heparin, heparin, heparin, HYDROcodone-acetaminophen, magic mouthwash w/lidocaine, ondansetron (ZOFRAN) IV, sodium chloride flush    Patient Profile   Mr Lesser is a 61 year old with a history of DMII, [place] 2016, rectal cancer, CKD Stage IV, chronic A fib, S/P AVN ablation, PAF, CAD BMS LAD 2011 BMS LAD 2009, OSA, chronic systolic heart failure, St Jude BiV ICD admitted with AKI and shock with elevated lactate, suspect cardiogenic.   Assessment/Plan   1. Acute on chronic systolic CHF/cardiogenic shock: Last echo in 8/17 with EF 15-20%.  Ischemic cardiomyopathy, has St Jude BiV ICD (despite chronic afib appears to be effectively BiV pacing).  Had recent admission (discharged 06/21/17) requiring milrinone support and diuresis.  Admitted with soft BP (SBP 80s-90s) + elevated lactate.  Suspect mixed septic/cardiogenic shock with MSSA bacteremia.  CVVH begun and ongoing.   - Remains volume overloaded on exam with CVP 11-12 - Co-ox OK at 63.2%.  - Continue CVVH per renal, UF 100 cc/hr.   - Continue norepinephrine, titrate down as able, currently at 8.  2. AKI on CKD stage 4: Baseline creatinine around 2.5.  He required brief HD in the past, has RUE fistula. - Started CRRT 8/6. Nephrology appreciated. Long-term HD would be difficult.  3. Atrial fibrillation: Chronic.  Warfarin on hold, INR 5.23 => 4.83 => 3.50.  4. CAD: No CP. Statin on hold for now. Holding aspirin.    5. ID: WBC trend 29>24>18.6>14>15>14.2.  Blood CX 2/2 MSSA. Antibiotics narrowed, now on Ancef. 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  - EP consulted. Will eventually need extraction (and temporary-permanent PPM). Dr Lovena Le is on vacation so he would need transfer [facility] if needs soon.   - Will discuss timing of TEE with MD. This afternoon versus tomorrow am.  6. H/O Rectal CA with diverting colostomy  7. Type II diabetes: Sliding scale. No change.  8. Liver failure: LFTs improving  Off statin.     Length of Stay: [place], [place]  07/18/2017, 7:55 AM  Advanced Heart Failure Team Pager [phone] (M-F; 7a - 4p)  Please contact [facility] for night-coverage after hours (4p -7a ) and weekends on [email or web address]  Agree with above.   Remains  very tenuous. Still on CVVHD. Requiring vasopressor support with NE at 8. Will continue UF at least one more day. Liver function also improving slowly.   On exam: Fatigued appearing R IJ trialysis cath HVP to jaw on left Cor RRR with s3. 2/6 SEM at RUSB Lungs: decreased. No wheeze Ab soft NT mildly distended. +LLQ colostomy Ext 2+ edema.   Continue CVVHD. Wean Norepi as tolerated. TEE tomorrow at 3pm. Continue heparin. Not candidate for long-term HD. Will need to assess for recovery of renal function prior to making any decisions on extracting ICD.   CRITICAL CARE Performed by: Glori Bickers  Total critical care time: 35 minutes  Critical care time was exclusive of separately billable procedures and treating other patients.  Critical care was necessary to treat or prevent imminent or life-threatening deterioration.  Critical care was time spent personally by me (independent of midlevel providers or residents) on the following activities: development of treatment plan with patient and/or surrogate as well as nursing, discussions with consultants, evaluation of patient's response to treatment, examination of patient, obtaining history from patient or surrogate, ordering and performing treatments and interventions, ordering and review of  laboratory studies, ordering and review of radiographic studies, pulse oximetry and re-evaluation of patient's condition.  Glori Bickers, MD  11:36 AM

## 2017-07-19 ENCOUNTER — Encounter (HOSPITAL_COMMUNITY): Payer: Self-pay | Admitting: Certified Registered Nurse Anesthetist

## 2017-07-19 ENCOUNTER — Encounter (HOSPITAL_COMMUNITY)
Admission: EM | Disposition: A | Discharge status: Rehabilitation Facility | Payer: Self-pay | Source: Home / Self Care | Attending: Emergency Medicine

## 2017-07-19 ENCOUNTER — Inpatient Hospital Stay (HOSPITAL_COMMUNITY): Payer: Medicare Other

## 2017-07-19 DIAGNOSIS — I340 Nonrheumatic mitral (valve) insufficiency: Secondary | ICD-10-CM

## 2017-07-19 LAB — RENAL FUNCTION PANEL
ALBUMIN: 2 g/dL — AB (ref 3.5–5.0)
ANION GAP: 5 (ref 5–15)
Albumin: 2.2 g/dL — ABNORMAL LOW (ref 3.5–5.0)
Anion gap: 7 (ref 5–15)
BUN: 17 mg/dL (ref 6–20)
BUN: 15 mg/dL (ref 6–20)
CALCIUM: 8.1 mg/dL — AB (ref 8.9–10.3)
CALCIUM: 8.1 mg/dL — AB (ref 8.9–10.3)
CO2: 25 mmol/L (ref 22–32)
CO2: 28 mmol/L (ref 22–32)
CREATININE: 1.17 mg/dL (ref 0.61–1.24)
CREATININE: 1.14 mg/dL (ref 0.61–1.24)
Chloride: 100 mmol/L — ABNORMAL LOW (ref 101–111)
Chloride: 100 mmol/L — ABNORMAL LOW (ref 101–111)
GFR calc Af Amer: >60 mL/min (ref >60)
GFR calc non Af Amer: >60 mL/min (ref >60)
GFR calc non Af Amer: >60 mL/min (ref >60)
GLUCOSE: 172 mg/dL — AB (ref 65–99)
GLUCOSE: 211 mg/dL — AB (ref 65–99)
PHOSPHORUS: 2.5 mg/dL (ref 2.5–4.6)
Phosphorus: 2.6 mg/dL (ref 2.5–4.6)
Potassium: 4.4 mmol/L (ref 3.5–5.1)
Potassium: 4.6 mmol/L (ref 3.5–5.1)
SODIUM: 132 mmol/L — AB (ref 135–145)
Sodium: 133 mmol/L — ABNORMAL LOW (ref 135–145)

## 2017-07-19 LAB — GLUCOSE, CAPILLARY
GLUCOSE-CAPILLARY: 158 mg/dL — AB (ref 65–99)
GLUCOSE-CAPILLARY: 166 mg/dL — AB (ref 65–99)
GLUCOSE-CAPILLARY: 179 mg/dL — AB (ref 65–99)
Glucose-Capillary: 164 mg/dL — ABNORMAL HIGH (ref 65–99)

## 2017-07-19 LAB — CBC
HCT: 26.2 % — ABNORMAL LOW (ref 39.0–52.0)
Hemoglobin: 7.9 g/dL — ABNORMAL LOW (ref 13.0–17.0)
MCH: 28.6 pg (ref 26.0–34.0)
MCHC: 30.2 g/dL (ref 30.0–36.0)
MCV: 94.9 fL (ref 78.0–100.0)
PLATELETS: 145 10*3/uL — AB (ref 150–400)
RBC: 2.76 MIL/uL — ABNORMAL LOW (ref 4.22–5.81)
RDW: 17.5 % — AB (ref 11.5–15.5)
WBC: 15 10*3/uL — ABNORMAL HIGH (ref 4.0–10.5)

## 2017-07-19 LAB — COOXEMETRY PANEL
Carboxyhemoglobin: 2.4 % — ABNORMAL HIGH (ref 0.5–1.5)
Methemoglobin: 1 % (ref 0.0–1.5)
O2 Saturation: 67.5 %
TOTAL HEMOGLOBIN: 8 g/dL — AB (ref 12.0–16.0)

## 2017-07-19 LAB — PROTIME-INR
INR: 2.27
PROTHROMBIN TIME: 25.4 s — AB (ref 11.4–15.2)

## 2017-07-19 LAB — MAGNESIUM: Magnesium: 2.3 mg/dL (ref 1.7–2.4)

## 2017-07-19 SURGERY — ECHOCARDIOGRAM, TRANSESOPHAGEAL
Anesthesia: Monitor Anesthesia Care

## 2017-07-19 MED ORDER — DIAZEPAM 5 MG PO TABS
5.0000 mg | ORAL_TABLET | Freq: Once | ORAL | Status: AC
  Administered 2017-07-19: 5 mg via ORAL
  Filled 2017-07-19: qty 1

## 2017-07-19 MED ORDER — MIDAZOLAM HCL 2 MG/2ML IJ SOLN
INTRAMUSCULAR | Status: AC
  Administered 2017-07-19: 2 mg
  Filled 2017-07-19: qty 4

## 2017-07-19 MED ORDER — FENTANYL CITRATE (PF) 100 MCG/2ML IJ SOLN
INTRAMUSCULAR | Status: AC
  Administered 2017-07-19: 25 ug
  Filled 2017-07-19: qty 2

## 2017-07-19 MED ORDER — SODIUM CHLORIDE 0.9 % IV SOLN
INTRAVENOUS | Status: DC
  Administered 2017-07-20 – 2017-07-21 (×2): via INTRAVENOUS

## 2017-07-19 MED ORDER — HEPARIN (PORCINE) IN NACL 100-0.45 UNIT/ML-% IJ SOLN
1650.0000 [IU]/h | INTRAMUSCULAR | Status: DC
  Administered 2017-07-19: 1300 [IU]/h via INTRAVENOUS
  Administered 2017-07-20: 1500 [IU]/h via INTRAVENOUS
  Administered 2017-07-21 (×2): 1650 [IU]/h via INTRAVENOUS
  Filled 2017-07-19 (×4): qty 250

## 2017-07-19 NOTE — Progress Notes (Signed)
Patient ID: [name], male   DOB: [birth year], 61 y.o.   MRN: [id number] S:Feels well O:BP (!) 106/54   Pulse 75   Temp 97.8 F (36.6 C) (Oral)   Resp 16   Ht 5\' 10"  (1.778 m)   Wt 82.9 kg (182 lb 12.2 oz)   SpO2 95%   BMI 26.22 kg/m   Intake/Output Summary (Last 24 hours) at 07/19/17 1134 Last data filed at 07/19/17 0800  Gross per 24 hour  Intake           411.78 ml  Output             2561 ml  Net         -2149.22 ml   Intake/Output: I/O last 3 completed shifts: In: 1321.4 [P.O.:810; I.V.:211.4; IV Piggyback:300] Out: 4289 [Urine:420; UMPNT:6144]  Intake/Output this shift:  Total I/O In: -  Out: 102 [Other:102] Weight change: -0.4 kg (-14.1 oz) Gen:NAD CVS:no rub Resp:cta RXV:QMGQQP Ext:no edema, RAVF +T/B   Recent Labs Lab 07/13/17 0825  07/15/17 0350  07/16/17 0324 07/16/17 1735 07/17/17 0326 07/17/17 1600 07/18/17 0339 07/18/17 1531 07/19/17 0451  NA  --   < > 132*  < > 132* 131* 132* 132* 132* 132* 132*  K  --   < > 4.3  < > 4.5 4.6 4.3 4.6 4.5 4.3 4.4  CL  --   < > 99*  < > 98* 99* 99* 99* 99* 99* 100*  CO2  --   < > 25  < > 24 27 26 27 27 26 25   GLUCOSE  --   < > 159*  < > 189* 218* 214* 246* 172* 175* 172*  BUN  --   < > 31*  < > 26* 23* 22* 20 20 19 17   CREATININE  --   < > 1.36*  < > 1.30* 1.25* 1.27* 1.26* 1.30* 1.26* 1.17  ALBUMIN 1.9*  < > 1.7*  < > 1.6* 1.9* 2.3* 2.3* 2.4* 2.2* 2.2*  CALCIUM  --   < > 8.1*  < > 8.2* 8.3* 8.4* 8.1* 8.3* 8.0* 8.1*  PHOS  --   < > 2.4*  < > 2.9 2.6 2.5 2.6 2.7 2.4* 2.6  AST 40  --  40  --   --   --   --   --   --   --   --   ALT 7*  --  7*  --   --   --   --   --   --   --   --   < > = values in this interval not displayed. Liver Function Tests:  Recent Labs Lab 07/13/17 0825  07/15/17 0350  07/18/17 0339 07/18/17 1531 07/19/17 0451  AST 40  --  40  --   --   --   --   ALT 7*  --  7*  --   --   --   --   ALKPHOS 143*  --  147*  --   --   --   --   BILITOT 4.8*  --  3.6*  --   --   --   --    PROT 6.3*  --  6.5  --   --   --   --   ALBUMIN 1.9*  < > 1.7*  < > 2.4* 2.2* 2.2*  < > = values in this interval not displayed. No results for input(s): LIPASE, AMYLASE  in the last 168 hours. No results for input(s): AMMONIA in the last 168 hours. CBC:  Recent Labs Lab 07/15/17 0350 07/16/17 0324 07/17/17 0326 07/18/17 0339 07/19/17 0451  WBC 18.6* 14.3* 15.4* 14.2* 15.0*  HGB 8.3* 8.2* 7.9* 7.6* 7.9*  HCT 26.1* 27.0* 25.4* 24.5* 26.2*  MCV 92.2 93.8 93.7 94.2 94.9  PLT 149* 164 124* 121* 145*   Cardiac Enzymes: No results for input(s): CKTOTAL, CKMB, CKMBINDEX, TROPONINI in the last 168 hours. CBG:  Recent Labs Lab 07/18/17 0826 07/18/17 1324 07/18/17 1702 07/18/17 2126 07/19/17 0759  GLUCAP 155* 188* 163* 149* 158*    Iron Studies: No results for input(s): IRON, TIBC, TRANSFERRIN, FERRITIN in the last 72 hours. Studies/Results: No results found. . Chlorhexidine Gluconate Cloth  6 each Topical Daily  . colchicine  0.3 mg Oral Daily  . fentaNYL      . insulin aspart  0-5 Units Subcutaneous QHS  . insulin aspart  0-9 Units Subcutaneous TID WC  . insulin aspart  3 Units Subcutaneous TID WC  . insulin glargine  10 Units Subcutaneous QHS  . midazolam      . sodium chloride flush  10-40 mL Intracatheter Q12H    BMET    Component Value Date/Time   NA 132 (L) 07/19/2017 0451   K 4.4 07/19/2017 0451   CL 100 (L) 07/19/2017 0451   CO2 25 07/19/2017 0451   GLUCOSE 172 (H) 07/19/2017 0451   BUN 17 07/19/2017 0451   CREATININE 1.17 07/19/2017 0451   CREATININE 2.67 (H) 03/18/2016 0917   CALCIUM 8.1 (L) 07/19/2017 0451   CALCIUM 7.9 (L) 07/12/2017 0723   GFRNONAA >60 07/19/2017 0451   GFRAA >60 07/19/2017 0451   CBC    Component Value Date/Time   WBC 15.0 (H) 07/19/2017 0451   RBC 2.76 (L) 07/19/2017 0451   HGB 7.9 (L) 07/19/2017 0451   HGB 14.6 05/02/2012 0852   HCT 26.2 (L) 07/19/2017 0451   HCT 23.6 (L) 04/24/2015 1116   HCT 43.6 05/02/2012 0852    PLT 145 (L) 07/19/2017 0451   PLT 163 05/02/2012 0852   MCV 94.9 07/19/2017 0451   MCV 93.3 05/02/2012 0852   MCH 28.6 07/19/2017 0451   MCHC 30.2 07/19/2017 0451   RDW 17.5 (H) 07/19/2017 0451   RDW 14.1 05/02/2012 0852   LYMPHSABS 0.3 (L) 07/12/2017 0424   LYMPHSABS 1.2 05/02/2012 0852   MONOABS 1.2 (H) 07/12/2017 0424   MONOABS 0.6 05/02/2012 0852   EOSABS 0.3 07/12/2017 0424   EOSABS 0.0 05/02/2012 0852   BASOSABS 0.0 07/12/2017 0424   BASOSABS 0.0 05/02/2012 0852   Current CRRT Prescription: Pre-filter fluid: 4K/2.5 at 300 ml/hr Post-filter fluid: 4k/2.5 at 200 ml/hr Dialysate: BGK 4/2.5 at 1,000 mL/hr UF goal 50-100 ml/hr Heparin 1,000-6,000 units prn   A/P  1. Dialysis dependent AKI/CKD 4- in setting of MSSA bacteremia and ischemic cardiomyopathy/hypotension/volume overload.  Admitted 07/09/17 with respiratory failuer and demand ischemia improving with initiation of CVVHD.  Continue with UF for now and hopefully can wean pressor and attempt IHD 1. CVP 11-12 and on 8 mcg norepi. 2. Continue CVVHD for now and hopefully can transition to IHD soon. 2. Acute on chronic CHF/ischemic CMP 3. MSSA bacteremia- unclear etiology.  On abx and will need TEE when stable to r/o SBE. 4. Anemia- transfuse prn and will add ESA 5. Hyponatremia- due to CHF/anasarca improved with CVVHD and UF 6. Cirrhosis- elevated t bili and abnormal lft's off of statin and  improving 7. Thrombocytopenia- may need to stop heparin, continue to follow. 8. Atrial fib- per cards   Donetta Potts, MD [facility] [phone]

## 2017-07-19 NOTE — Interval H&P Note (Signed)
History and Physical Interval Note:  07/19/2017 9:17 AM  [name]  has presented today for surgery, with the diagnosis of bacteremia  The various methods of treatment have been discussed with the patient and family. After consideration of risks, benefits and other options for treatment, the patient has consented to  Procedure(s): TRANSESOPHAGEAL ECHOCARDIOGRAM (TEE) (N/A) as a surgical intervention .  The patient's history has been reviewed, patient examined, no change in status, stable for surgery.  I have reviewed the patient's chart and labs.  Questions were answered to the patient's satisfaction.     Latria Mccarron, Quillian Quince

## 2017-07-19 NOTE — H&P (View-Only) (Signed)
Patient ID: [name], male   DOB: 1956-11-29, 61 y.o.   MRN: [id number]     Advanced Heart Failure Rounding Note  Primary Cardiologist: Bensimhon  Subjective:    Admitted with fatigue, dyspnea, recurrent lower extremity swelling and decreased UOP, cardiogenic shock.   Remains on CRT with UF ~ 100 per hour. On 4 of  Norepi, down from 6 overnight.  still. Only 225 cc UOP x 24 hrs. CVP remains 10-12 range. Weight shows up 2 lbs.   Feeling ok this am. Denies SOB. Denies lightheadedness or dizziness, but in bed.    Blood CX- 2/2 MSSA ? From LLE wounds?Marland Kitchen On cefazolin.   Objective:   Weight Range: 183 lb 10.3 oz (83.3 kg) Body mass index is 26.35 kg/m.   Vital Signs:   Temp:  [97.4 F (36.3 C)-97.9 F (36.6 C)] 97.8 F (36.6 C) (08/13 0330) Pulse Rate:  [47-81] 75 (08/13 0700) Resp:  [7-23] 17 (08/13 0700) BP: (87-124)/(45-94) 94/45 (08/13 0700) SpO2:  [92 %-100 %] 96 % (08/13 0700) Weight:  [183 lb 10.3 oz (83.3 kg)] 183 lb 10.3 oz (83.3 kg) (08/13 0430) Last BM Date: 07/13/17 (ostomy moved, bag changed)  Weight change: Filed Weights   07/16/17 0630 07/17/17 0630 07/18/17 0430  Weight: 183 lb 13.8 oz (83.4 kg) 181 lb 3.5 oz (82.2 kg) 183 lb 10.3 oz (83.3 kg)    Intake/Output:   Intake/Output Summary (Last 24 hours) at 07/18/17 0755 Last data filed at 07/18/17 0700  Gross per 24 hour  Intake          1441.38 ml  Output             2842 ml  Net         -1400.62 ml      Physical Exam  CVP 11-12 General: Chronically ill appearing. Color improved. No resp difficulty. HEENT: Normal Neck: Supple. JVP 11-12 cm. Carotids 2+ bilat; no bruits. No thyromegaly or nodule noted. Cor: PMI nondisplaced. RRR, 2/6 HSM LLSB.  Lungs: CTAB, normal effort. Abdomen: Soft, non-tender, non-distended, no HSM. No bruits or masses. +BS  Extremities: No cyanosis, clubbing, or rash. Trace to 1+ edema to knees.  Neuro: Alert & orientedx3, cranial nerves grossly intact. moves all 4  extremities w/o difficulty. Affect pleasant   Telemetry   Personally reviewed, BiV pacing 70s, underlying Afib.   Labs    CBC  Recent Labs  07/17/17 0326 07/18/17 0339  WBC 15.4* 14.2*  HGB 7.9* 7.6*  HCT 25.4* 24.5*  MCV 93.7 94.2  PLT 124* 884*   Basic Metabolic Panel  Recent Labs  07/17/17 0326 07/17/17 1600 07/18/17 0339  NA 132* 132* 132*  K 4.3 4.6 4.5  CL 99* 99* 99*  CO2 26 27 27   GLUCOSE 214* 246* 172*  BUN 22* 20 20  CREATININE 1.27* 1.26* 1.30*  CALCIUM 8.4* 8.1* 8.3*  MG 2.6*  --  2.3  PHOS 2.5 2.6 2.7   Liver Function Tests  Recent Labs  07/17/17 1600 07/18/17 0339  ALBUMIN 2.3* 2.4*   No results for input(s): LIPASE, AMYLASE in the last 72 hours. Cardiac Enzymes No results for input(s): CKTOTAL, CKMB, CKMBINDEX, TROPONINI in the last 72 hours.  BNP: BNP (last 3 results)  Recent Labs  06/13/17 1205 07/09/17 2151  BNP 1,151.0* 1,553.9*    ProBNP (last 3 results) No results for input(s): PROBNP in the last 8760 hours.   D-Dimer No results for input(s): DDIMER in the last 72 hours.  Hemoglobin A1C No results for input(s): HGBA1C in the last 72 hours. Fasting Lipid Panel No results for input(s): CHOL, HDL, LDLCALC, TRIG, CHOLHDL, LDLDIRECT in the last 72 hours. Thyroid Function Tests No results for input(s): TSH, T4TOTAL, T3FREE, THYROIDAB in the last 72 hours.  Invalid input(s): FREET3  Other results:   Imaging   No results found.  Medications:     Scheduled Medications: . Chlorhexidine Gluconate Cloth  6 each Topical Daily  . colchicine  0.3 mg Oral Daily  . insulin aspart  0-5 Units Subcutaneous QHS  . insulin aspart  0-9 Units Subcutaneous TID WC  . insulin aspart  3 Units Subcutaneous TID WC  . insulin glargine  10 Units Subcutaneous QHS  . sodium chloride flush  10-40 mL Intracatheter Q12H    Infusions: . sodium chloride Stopped (07/15/17 1500)  .  ceFAZolin (ANCEF) IV Stopped (07/18/17 9417)  . heparin  999 mL (07/17/17 2143)  . norepinephrine (LEVOPHED) Adult infusion 6 mcg/min (07/18/17 0700)  . dialysis replacement fluid (prismasate) 300 mL/hr at 07/18/17 0507  . dialysis replacement fluid (prismasate) 200 mL/hr at 07/18/17 0507  . dialysate (PRISMASATE) 1,000 mL/hr at 07/18/17 0406    PRN Medications: sodium chloride, acetaminophen, alum & mag hydroxide-simeth, fentaNYL (SUBLIMAZE) injection, heparin, heparin, heparin, HYDROcodone-acetaminophen, magic mouthwash w/lidocaine, ondansetron (ZOFRAN) IV, sodium chloride flush    Patient Profile   [name] is a 61 year old with a history of DMII, [place] 2016, rectal cancer, CKD Stage IV, chronic A fib, S/P AVN ablation, PAF, CAD BMS LAD 2011 BMS LAD 2009, OSA, chronic systolic heart failure, St Jude BiV ICD admitted with AKI and shock with elevated lactate, suspect cardiogenic.   Assessment/Plan   1. Acute on chronic systolic CHF/cardiogenic shock: Last echo in 8/17 with EF 15-20%.  Ischemic cardiomyopathy, has St Jude BiV ICD (despite chronic afib appears to be effectively BiV pacing).  Had recent admission (discharged 06/21/17) requiring milrinone support and diuresis.  Admitted with soft BP (SBP 80s-90s) + elevated lactate.  Suspect mixed septic/cardiogenic shock with MSSA bacteremia.  CVVH begun and ongoing.   - Remains volume overloaded on exam with CVP 11-12 - Co-ox OK at 63%.2%.  - Continue CVVH per renal, UF 100 cc/hr.   - Continue norepinephrine, titrate down as able, currently at 8.  2. AKI on CKD stage 4: Baseline creatinine around 2.5.  He required brief HD in the past, has RUE fistula. - Started CRRT 8/6. Nephrology appreciated. Long-term HD would be difficult.  3. Atrial fibrillation: Chronic.  Warfarin on hold, INR 5.23 => 4.83 => 3.50.  4. CAD: No CP. Statin on hold for now. Holding aspirin.    5. ID: WBC trend 29>24>18.6>14>15>14.2.  Blood CX 2/2 MSSA. Antibiotics narrowed, now on Ancef. 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  - EP consulted. Will eventually need extraction (and temporary-permanent PPM). Dr Lovena Le is on vacation so he would need transfer [facility] if needs soon.   - Will discuss timing of TEE with MD. This afternoon versus tomorrow am.  6. H/O Rectal CA with diverting colostomy  7. Type II diabetes: Sliding scale. No change.  8. Liver failure: LFTs improving  Off statin.     Length of Stay: [place], [place]  07/18/2017, 7:55 AM  Advanced Heart Failure Team Pager [phone] (M-F; 7a - 4p)  Please contact [facility] for night-coverage after hours (4p -7a ) and weekends on [email or web address]  Agree with above.   Remains  very tenuous. Still on CVVHD. Requiring vasopressor support with NE at 8. Will continue UF at least one more day. Liver function also improving slowly.   On exam: Fatigued appearing R IJ trialysis cath HVP to jaw on left Cor RRR with s3. 2/6 SEM at RUSB Lungs: decreased. No wheeze Ab soft NT mildly distended. +LLQ colostomy Ext 2+ edema.   Continue CVVHD. Wean Norepi as tolerated. TEE tomorrow at 3pm. Continue heparin. Not candidate for long-term HD. Will need to assess for recovery of renal function prior to making any decisions on extracting ICD.   CRITICAL CARE Performed by: Glori Bickers  Total critical care time: 35 minutes  Critical care time was exclusive of separately billable procedures and treating other patients.  Critical care was necessary to treat or prevent imminent or life-threatening deterioration.  Critical care was time spent personally by me (independent of midlevel providers or residents) on the following activities: development of treatment plan with patient and/or surrogate as well as nursing, discussions with consultants, evaluation of patient's response to treatment, examination of patient, obtaining history from patient or surrogate, ordering and performing treatments and interventions, ordering and review of  laboratory studies, ordering and review of radiographic studies, pulse oximetry and re-evaluation of patient's condition.  Glori Bickers, MD  11:36 AM

## 2017-07-19 NOTE — Progress Notes (Signed)
During TEE procedure pt was given .45mcg of Fent and 23mcg of Versed-.72mcg of Fent and 50mcg of Versed wasted by Idelia Salm RN and Cleotis Nipper RN in sharps box.

## 2017-07-19 NOTE — CV Procedure (Signed)
    TRANSESOPHAGEAL ECHOCARDIOGRAM   NAME:  [name]   MRN: [id number] DOB:  1955-12-17   ADMIT DATE: 07/09/2017  INDICATIONS:   PROCEDURE:   Informed consent was obtained prior to the procedure. The risks, benefits and alternatives for the procedure were discussed and the patient comprehended these risks.  Risks include, but are not limited to, cough, sore throat, vomiting, nausea, somnolence, esophageal and stomach trauma or perforation, bleeding, low blood pressure, aspiration, pneumonia, infection, trauma to the teeth and death.    After a procedural time-out, the patient was given 2 mg versed and 25 mcg fentanyl for moderate sedation.  The oropharynx was anesthetized with aerosolized cetacaine spray.  The transesophageal probe was inserted in the esophagus and stomach without difficulty and multiple views were obtained.    COMPLICATIONS:    There were no immediate complications.  FINDINGS:  LEFT VENTRICLE: EF = 20%. Diffuse HK  RIGHT VENTRICLE: Moderate HK. ICD/pacing wires without vegetation.   LEFT ATRIUM: Dilated   LEFT ATRIAL APPENDAGE: No thrombus. + smoke  RIGHT ATRIUM: Dilated. Pacing wires without vegetation.  AORTIC VALVE:  [place]. Mild AI. No vegetation.   MITRAL VALVE:    Normal. Mild MR. No vegetation.   TRICUSPID VALVE: Normal. Moderate to severe TR. No vegetation   PULMONIC VALVE: Grossly normal. No PI. No vegetation   INTERATRIAL SEPTUM: No PFO or ASD.  PERICARDIUM: No effusion  DESCENDING AORTA: Moderate plaque.   CONCLUSION:  No TEE evidence of endocarditis.   Bensimhon, Daniel,MD 9:47 AM

## 2017-07-19 NOTE — Progress Notes (Signed)
Patient ID: [name], male   DOB: Mar 30, 1956, 61 y.o.   MRN: [id number]     Advanced Heart Failure Rounding Note  Primary Cardiologist: Bensimhon  Subjective:    Admitted with fatigue, dyspnea, recurrent lower extremity swelling and decreased UOP, cardiogenic shock.   Remains on CRT with UF 70-100 cc per hour. On 7 of Norepi.    285 cc UO yesterday. CVP 11-12. Weight down 1 lb.    Feeling OK. Denies SOB or lightheadedness. Somewhat anxious about what TEE findings will show.    Blood CX- 2/2 MSSA ? From LLE wounds?Marland Kitchen On cefazolin.   Objective:   Weight Range: 182 lb 12.2 oz (82.9 kg) Body mass index is 26.22 kg/m.   Vital Signs:   Temp:  [96.4 F (35.8 C)-98 F (36.7 C)] 97.8 F (36.6 C) (08/14 0805) Pulse Rate:  [71-81] 75 (08/14 0600) Resp:  [7-18] 16 (08/14 0600) BP: (75-115)/(47-80) 106/54 (08/14 0600) SpO2:  [93 %-100 %] 95 % (08/14 0600) Weight:  [182 lb 12.2 oz (82.9 kg)] 182 lb 12.2 oz (82.9 kg) (08/14 0449) Last BM Date: 07/13/17 (ostomy moved, bag changed)  Weight change: Filed Weights   07/17/17 0630 07/18/17 0430 07/19/17 0449  Weight: 181 lb 3.5 oz (82.2 kg) 183 lb 10.3 oz (83.3 kg) 182 lb 12.2 oz (82.9 kg)    Intake/Output:   Intake/Output Summary (Last 24 hours) at 07/19/17 0829 Last data filed at 07/19/17 0800  Gross per 24 hour  Intake           548.58 ml  Output             2902 ml  Net         -2353.42 ml      Physical Exam   CVP 11-12  General: Chronically ill appearing. No resp difficulty. HEENT: Normal Neck: Supple. JVP 11-12 cm. Carotids 2+ bilat; no bruits. No thyromegaly or nodule noted. Cor: PMI nondisplaced. RRR, 2/6 HSM LLSB.  Lungs: CTAB, normal effort. Abdomen: Soft, non-tender, non-distended, no HSM. No bruits or masses. +BS  Extremities: No cyanosis, clubbing, or rash. Trace to 1+ edema to knees.   Neuro: Alert & orientedx3, cranial nerves grossly intact. moves all 4 extremities w/o difficulty. Affect pleasant    Telemetry   Personally reviewed, BiV pacing 70s, underlying Afib.    Labs    CBC  Recent Labs  07/18/17 0339 07/19/17 0451  WBC 14.2* 15.0*  HGB 7.6* 7.9*  HCT 24.5* 26.2*  MCV 94.2 94.9  PLT 121* 176*   Basic Metabolic Panel  Recent Labs  07/18/17 0339 07/18/17 1531 07/19/17 0451  NA 132* 132* 132*  K 4.5 4.3 4.4  CL 99* 99* 100*  CO2 27 26 25   GLUCOSE 172* 175* 172*  BUN 20 19 17   CREATININE 1.30* 1.26* 1.17  CALCIUM 8.3* 8.0* 8.1*  MG 2.3  --  2.3  PHOS 2.7 2.4* 2.6   Liver Function Tests  Recent Labs  07/18/17 1531 07/19/17 0451  ALBUMIN 2.2* 2.2*   No results for input(s): LIPASE, AMYLASE in the last 72 hours. Cardiac Enzymes No results for input(s): CKTOTAL, CKMB, CKMBINDEX, TROPONINI in the last 72 hours.  BNP: BNP (last 3 results)  Recent Labs  06/13/17 1205 07/09/17 2151  BNP 1,151.0* 1,553.9*    ProBNP (last 3 results) No results for input(s): PROBNP in the last 8760 hours.   D-Dimer No results for input(s): DDIMER in the last 72 hours. Hemoglobin A1C No results for  input(s): HGBA1C in the last 72 hours. Fasting Lipid Panel No results for input(s): CHOL, HDL, LDLCALC, TRIG, CHOLHDL, LDLDIRECT in the last 72 hours. Thyroid Function Tests No results for input(s): TSH, T4TOTAL, T3FREE, THYROIDAB in the last 72 hours.  Invalid input(s): FREET3  Other results:   Imaging   No results found.  Medications:     Scheduled Medications: . Chlorhexidine Gluconate Cloth  6 each Topical Daily  . colchicine  0.3 mg Oral Daily  . insulin aspart  0-5 Units Subcutaneous QHS  . insulin aspart  0-9 Units Subcutaneous TID WC  . insulin aspart  3 Units Subcutaneous TID WC  . insulin glargine  10 Units Subcutaneous QHS  . sodium chloride flush  10-40 mL Intracatheter Q12H    Infusions: . sodium chloride Stopped (07/15/17 1500)  .  ceFAZolin (ANCEF) IV Stopped (07/19/17 0630)  . heparin 999 mL (07/17/17 2143)  . norepinephrine  (LEVOPHED) Adult infusion 7 mcg/min (07/19/17 0535)  . dialysis replacement fluid (prismasate) 300 mL/hr at 07/18/17 2212  . dialysis replacement fluid (prismasate) 200 mL/hr at 07/19/17 2542  . dialysate (PRISMASATE) 1,000 mL/hr at 07/19/17 0509    PRN Medications: sodium chloride, acetaminophen, alum & mag hydroxide-simeth, fentaNYL (SUBLIMAZE) injection, heparin, heparin, heparin, HYDROcodone-acetaminophen, magic mouthwash w/lidocaine, ondansetron (ZOFRAN) IV, sodium chloride flush    Patient Profile   [name] is a 61 year old with a history of DMII, [place] 2016, rectal cancer, CKD Stage IV, chronic A fib, S/P AVN ablation, PAF, CAD BMS LAD 2011 BMS LAD 2009, OSA, chronic systolic heart failure, St Jude BiV ICD admitted with AKI and shock with elevated lactate, suspect cardiogenic.   Assessment/Plan   1. Acute on chronic systolic CHF/cardiogenic shock: Last echo in 8/17 with EF 15-20%.  Ischemic cardiomyopathy, has St Jude BiV ICD (despite chronic afib appears to be effectively BiV pacing).  Had recent admission (discharged 06/21/17) requiring milrinone support and diuresis.  Admitted with soft BP (SBP 80s-90s) + elevated lactate.  Suspect mixed septic/cardiogenic shock with MSSA bacteremia.  CVVH begun and ongoing.   - CVP 11-12. Continue CRRT. Remains up from baseline.  - Co-ox OK at 67.5%.   - Continue CVVH per renal, UF 100 cc/hr.   - Continue norepinephrine.  Remains at 7 currently.   2. AKI on CKD stage 4: Baseline creatinine around 2.5.  He required brief HD in the past, has RUE fistula. - Started CRRT 8/6. Nephrology appreciated. Long-term HD would be difficult. Tolerating CRRT. 3. Atrial fibrillation: Chronic.  Warfarin on hold, INR 5.23 => 4.83 => 3.50 => 2.27 4. CAD: No CP. Statin on hold for now. Holding aspirin.    5. ID: WBC trend 29>24>18.6>14>15>14.2>15.0.   - Blood CX 2/2 MSSA. Antibiotics narrowed, now on Ancef. 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  -  EP following. Will eventually need extraction (and temporary-permanent PPM). Dr Lovena Le is on vacation so he would need transfer [facility] if needs soon.   - Plan for TEE today.  6. H/O Rectal CA with diverting colostomy  7. Type II diabetes: Sliding scale.  - No change.  8. Liver failure:  - LFTs stabilized. Off statin.      Plan for Bedside TEE this am.   Length of Stay: [address], [place]  07/19/2017, 8:29 AM  Advanced Heart Failure Team Pager [phone] (M-F; 7a - 4p)  Please contact [facility] for night-coverage after hours (4p -7a ) and weekends on [email or web address]  Patient seen  and examined with the above-signed Advanced Practice Provider and/or Housestaff. I personally reviewed laboratory data, imaging studies and relevant notes. I independently examined the patient and formulated the important aspects of the plan. I have edited the note to reflect any of my changes or salient points. I have personally discussed the plan with the patient and/or family.  Remains tenuous. Still on CVVHD. Volume status improving slowly. NE dose down to 8. INR finally coming down. Can restart warfarin. D/w PharmD.   TEE done today at bedside EF 20%. No evidence of valvular or device-related vegetation. Will continue with IV abx and plan suppressive abx as he remains to ill for device extraction at this point.   Renal managing volume status. May be able to hold CVVHD soon and gice him a trial of diuretics and see if kidneys recover.  Glori Bickers, MD  9:45 AM

## 2017-07-19 NOTE — Progress Notes (Signed)
  Echocardiogram Echocardiogram Transesophageal has been performed.  Tresa Res 07/19/2017, 10:11 AM

## 2017-07-19 NOTE — Progress Notes (Signed)
[facility] for heparin Indication: atrial fibrillation  No Known Allergies  Patient Measurements: Height: 5\' 10"  (177.8 cm) Weight: 182 lb 12.2 oz (82.9 kg) IBW/kg (Calculated) : 73  Heparin dosing weight: 90.6 kg  Vital Signs: Temp: 97.8 F (36.6 C) (08/14 0805) Temp Source: Oral (08/14 0805) BP: 106/54 (08/14 0600) Pulse Rate: 75 (08/14 0600)  Labs:  Recent Labs  07/17/17 0326 07/17/17 0651  07/18/17 0339 07/18/17 1531 07/19/17 0451  HGB 7.9*  --   --  7.6*  --  7.9*  HCT 25.4*  --   --  24.5*  --  26.2*  PLT 124*  --   --  121*  --  145*  LABPROT  --  46.5*  --  36.0*  --  25.4*  INR  --  4.83*  --  3.50  --  2.27  CREATININE 1.27*  --   < > 1.30* 1.26* 1.17  < > = values in this interval not displayed.  Estimated Creatinine Clearance: 67.6 mL/min (by C-G formula based on SCr of 1.17 mg/dL).  Assessment: 61 year old male with chronic afib on warfarin prior to admission. Patient recently seen in clinic with supratherapeutic INR 3.7 dose reduced to 1mg  of coumadin daily except 2mg  on Tuesday, Thursday, Saturday.  Anticoagulation: Planning to hold warfarin and begin heparin when INR eventually trends down, trending down to 2.2 this morning. Hgb stable at 7.9, no overt bleeding noted.  Bacteremia: TEE today at bedside. No change in ancef as he continues on CRRT. Repeat cultures on 8/6 have been negative.  Goal of Therapy:  Heparin level goal 0.3-0.7 INR 2-3 Monitor platelets by anticoagulation protocol: Yes   Plan:  -Begin heparin tonight at 8pm, 1300 units/hr -Follow daily INR for now -Continue ancef at current dosing for now  Erin Hearing PharmD., BCPS Clinical Pharmacist Pager [phone] 07/19/2017 8:51 AM

## 2017-07-19 NOTE — Progress Notes (Signed)
      INFECTIOUS DISEASE ATTENDING ADDENDUM:   Date: 07/19/2017  Patient name: [name]  Medical record number: [id number]  Date of birth: [birth year]    Pts TEE is clean of vegetations. He has cleared his bacteremia.  Despite this I still feel very strongly about 2 things they will need to clear this infection  #1 he will need a holiday from his hemodialysis catheter and central lines with repeat blood cultures after removal of lines #2 he would need his artifact device extracted to effect cure.     Alcide Evener 07/19/2017, 4:10 PM

## 2017-07-19 NOTE — Progress Notes (Signed)
Patient continues to refuse the use of CPAP.

## 2017-07-19 NOTE — Progress Notes (Signed)
PULMONARY / CRITICAL CARE MEDICINE   Name: [name] MRN: [id number] DOB: 12/30/1955    ADMISSION DATE:  07/09/2017   CONSULTATION DATE:  07/10/2017  REFERRING MD:  Sarajane Jews  CHIEF COMPLAINT:  dyspnea  HISTORY OF PRESENT ILLNESS:  61 y.o. male with history of ischemic cardiomyopathy status post ICD placement. Also history of chronic renal failure. Admitted on 8/4 with acute respiratory failure secondary to pulmonary edema. Subsequently found to have a MSSA bacteremia. Patient has had persistent shock requiring vasopressor support and is on renal replacement therapy.   SUBJECTIVE:  Patient underwent transesophageal echocardiogram today. Returned on nasal cannula oxygen. Denies any chest pain or pressure. Denies any dyspnea or cough.  REVIEW OF SYSTEMS:  Denies any abdominal pain or nausea. No subjective fever or chills.  VITAL SIGNS: BP (!) 106/54   Pulse 75   Temp 97.8 F (36.6 C) (Oral)   Resp 16   Ht 5\' 10"  (1.778 m)   Wt 182 lb 12.2 oz (82.9 kg)   SpO2 95%   BMI 26.22 kg/m   HEMODYNAMICS: CVP:  [9 mmHg-13 mmHg] 13 mmHg  VENTILATOR SETTINGS:    INTAKE / OUTPUT: I/O last 3 completed shifts: In: 1321.4 [P.O.:810; I.V.:211.4; IV Piggyback:300] Out: 4289 [Urine:420; ZJQBH:4193]  PHYSICAL EXAMINATION: General:  Awake. No distress. Comfortable. Integument:  Warm. Dry. No rash on exposed skin. HEENT:  No scleral icterus or injection. Dry mucous membranes. Cardiovascular: Unable to appreciate JVD. Pacer rhythm on telemetry. Regular rate. Pulmonary: Normal breathing on nasal cannula oxygen. Overall clear to auscultation. Abdomen: Nontender. Soft. Normal bowel sounds. Neurological: Moving all 4 extremities equally. No meningismus. Oriented 4.  LABS:  BMET  Recent Labs Lab 07/18/17 0339 07/18/17 1531 07/19/17 0451  NA 132* 132* 132*  K 4.5 4.3 4.4  CL 99* 99* 100*  CO2 27 26 25   BUN 20 19 17   CREATININE 1.30* 1.26* 1.17  GLUCOSE 172* 175* 172*     Electrolytes  Recent Labs Lab 07/17/17 0326  07/18/17 0339 07/18/17 1531 07/19/17 0451  CALCIUM 8.4*  < > 8.3* 8.0* 8.1*  MG 2.6*  --  2.3  --  2.3  PHOS 2.5  < > 2.7 2.4* 2.6  < > = values in this interval not displayed.  CBC  Recent Labs Lab 07/17/17 0326 07/18/17 0339 07/19/17 0451  WBC 15.4* 14.2* 15.0*  HGB 7.9* 7.6* 7.9*  HCT 25.4* 24.5* 26.2*  PLT 124* 121* 145*    Coag's  Recent Labs Lab 07/17/17 0651 07/18/17 0339 07/19/17 0451  INR 4.83* 3.50 2.27    Sepsis Markers No results for input(s): LATICACIDVEN, PROCALCITON, O2SATVEN in the last 168 hours.  ABG No results for input(s): PHART, PCO2ART, PO2ART in the last 168 hours.  Liver Enzymes  Recent Labs Lab 07/12/17 1110  07/13/17 0825  07/15/17 0350  07/18/17 0339 07/18/17 1531 07/19/17 0451  AST 34  --  40  --  40  --   --   --   --   ALT 8*  --  7*  --  7*  --   --   --   --   ALKPHOS 126  --  143*  --  147*  --   --   --   --   BILITOT 4.7*  --  4.8*  --  3.6*  --   --   --   --   ALBUMIN 2.0*  < > 1.9*  < > 1.7*  < > 2.4* 2.2*  2.2*  < > = values in this interval not displayed.  Cardiac Enzymes No results for input(s): TROPONINI, PROBNP in the last 168 hours.  Glucose  Recent Labs Lab 07/17/17 2117 07/18/17 0826 07/18/17 1324 07/18/17 1702 07/18/17 2126 07/19/17 0759  GLUCAP 258* 155* 188* 163* 149* 158*    Imaging No results found.   IMAGING/STUDIES: CT ABD/PELVIS W/O 8/5: IMPRESSION: 1. Cirrhosis. Moderate ascites. Anasarca. 2. Diverting colostomy without bowel obstruction. Similar thickened rectum compatible with treatment related changes. 3. Mild cardiomegaly. Moderate RIGHT and small LEFT pleural effusions. TTE 8/5:  LV mildly dilated with mild concentric hypertrophy. EF 15-20% with diffuse hypokinesis and apical akinesis. Not sufficient to allow assessment of diastolic function. LA mildly dilated & RA normal in size. RV normal in size and function. Pacer wire  noted within right ventricle. No aortic stenosis with moderate regurgitation. Aortic root normal in size. Mild mitral regurgitation without stenosis. Flattening of the intraventricular septum consistent with right ventricular pressure and volume overload. Mild pulmonic regurgitation without stenosis. Severe tricuspid regurgitation. No pericardial effusion. Study showed no vegetations but image quality was poor and study was technically difficult. CT R HIP W/O 8/6: IMPRESSION: 1. No acute fracture nor dislocation of either hip. No evidence of AVN. Probable right-sided gluteal tendinopathy. 2. The bones are demineralized in appearance. There is generalized muscle atrophy about the right hip. 3. Anasarca with moderate abdominopelvic ascites. 4. Moderate to large bilateral hydroceles. PORT CXR 8/6:  Previously reviewed by me. Right internal jugular central venous catheter into position. Stable volume loss on the right. No new focal opacity. Implanted device within the left chest wall with leads projecting to the heart. TEE 8/14:  EF 20% with diffuse hypokinesis. Moderate hypokinesis of RV. ICD/pacing wires noted without vegetation. Left atrium dilated without thrombus. Right atrium dilated and pacing wires without vegetation. Mild aortic insufficiency without vegetation. Mild mitral regurgitation without vegetation. Moderate to severe tricuspid regurgitation without vegetation. Pulmonic valve without vegetation. No PFO or ASD. No pericardial effusion.  MICROBIOLOGY: Blood Cultures x2 7/7:  Negative  MRSA PCR 8/5: Negative  Blood Cultures x2 8/4:  2/2 Bottles MSSA Urine Culture 8/4:  Negative  Blood Cultures x2 8/6:  Negative   ANTIBIOTICS: Vancomycin 8/5 Rifampin 8/6 - 8/7 Ancef 8/6 >>>  SIGNIFICANT EVENTS: 08/04 - Admit 08/05 - Started on CVVHD  LINES/TUBES: Foley (out) R IJ TEMP HD CATHETER 8/5 >>> PIV  ASSESSMENT / PLAN:  PULMONARY A: Acute hypoxic respiratory failure: Minimal  oxygen requirement postprocedure. Pulmonary hypertension: Noted on echocardiogram. Likely multifactorial. History of OSA  P:   Continuing to wean FiO2 Continuous pulse oximetry monitoring CPAP daily at bedtime auto titrating   CARDIOVASCULAR A:  Ischemic cardio myopathy: Known history of coronary artery disease. Shock: Combination of sepsis and cardiogenic shock. Status post ICD: Currently with bacteremia. Paroxysmal atrial fibrillation  P:  Weaning Levophed for mean arterial pressure >65 Continuous telemetry monitoring Cardiology following & managing  RENAL A:   Hyponatremia:  Mild.  Acute on chronic renal failure: Unknown prerenal state.  P:   Continuing to monitor like lites every 12 hours Renal replacement therapy as per cardiology and nephrology Monitoring urine output   GASTROINTESTINAL A:   History of rectal cancer: Status post colostomy.  Liver cirrhosis  P:   Continuing diet  HEMATOLOGIC A:   Anemia: Hgb stable. No signs of active bleeding. Coagulopathy: Multifactorial from sepsis, antibiotics, Coumadin, and liver cirrhosis. Leukocytosis: Overall stable. Likely due to sepsis.  P:  Continuing to  hold Coumadin Trending cell counts daily with CBC Trending INR daily Transfuse for Hgb <7.5 or active bleeding  INFECTIOUS A:   Sepsis MSSA bacteremia: Hemodialysis catheter placed 8/5. ICD in place. Possible source right leg wound. TEE negative.  P:   ID following & appreciate input Continuing antibiotics as above   ENDOCRINE A:   Hyperglycemia: Multifactorial from tube feedings and steroids. Better controlled on Lantus.  P:   Continuing Lantus 10 units subcutaneous daily at bedtime Continuing Accu-Cheks before every meal & at bedtime Continuing sliding scale insulin per sensitive algorithm Continuing NovoLog 3 units with meals  NEUROLOGIC A:   Arthritis pain  P:   Continuing Norco when necessary  ORTHO A: Achilles Tendon  Pain Arthritis/Hip Pain  P: Monitor  Prophylaxis:  SCDs & systemic anticoagulation. Diet:  Heart Healthy Carb Modified.  Code Status:  Full Code per previous physician discussions. Disposition:  Continuing to wean vasopressor support while critically ill.  Family Update:  Patient updated during rounds today.  DISCUSSION:  61 y.o.  male with shock multifactorial in etiology. No evidence of endocarditis on transesophageal echocardiogram. Continuing on systemic antibiotics. Weaning vasopressor infusion as tolerated. Renal replacement therapy per nephrology and cardiology.  I have spent a total of 33 minutes of critical care time today caring for the patient, updating the patient at bedside, and reviewing the patient's electronic medical record.   Sonia Baller Ashok Cordia, M.D. [facility] Pulmonary & Critical Care Pager:  [phone] After 3pm or if no response, call [phone] 07/19/2017, 10:18 AM

## 2017-07-20 ENCOUNTER — Telehealth: Payer: Self-pay | Admitting: Internal Medicine

## 2017-07-20 LAB — GLUCOSE, CAPILLARY
GLUCOSE-CAPILLARY: 122 mg/dL — AB (ref 65–99)
GLUCOSE-CAPILLARY: 197 mg/dL — AB (ref 65–99)
Glucose-Capillary: 144 mg/dL — ABNORMAL HIGH (ref 65–99)
Glucose-Capillary: 131 mg/dL — ABNORMAL HIGH (ref 65–99)

## 2017-07-20 LAB — RENAL FUNCTION PANEL
ANION GAP: 7 (ref 5–15)
Albumin: 2 g/dL — ABNORMAL LOW (ref 3.5–5.0)
Albumin: 2 g/dL — ABNORMAL LOW (ref 3.5–5.0)
Anion gap: 6 (ref 5–15)
BUN: 13 mg/dL (ref 6–20)
BUN: 17 mg/dL (ref 6–20)
CALCIUM: 8.1 mg/dL — AB (ref 8.9–10.3)
CHLORIDE: 100 mmol/L — AB (ref 101–111)
CO2: 27 mmol/L (ref 22–32)
CO2: 25 mmol/L (ref 22–32)
CREATININE: 1.1 mg/dL (ref 0.61–1.24)
Calcium: 8.1 mg/dL — ABNORMAL LOW (ref 8.9–10.3)
Chloride: 100 mmol/L — ABNORMAL LOW (ref 101–111)
Creatinine, Ser: 1.37 mg/dL — ABNORMAL HIGH (ref 0.61–1.24)
GFR calc Af Amer: >60 mL/min (ref >60)
GFR calc non Af Amer: 54 mL/min — ABNORMAL LOW (ref >60)
GLUCOSE: 149 mg/dL — AB (ref 65–99)
Glucose, Bld: 145 mg/dL — ABNORMAL HIGH (ref 65–99)
Phosphorus: 2.4 mg/dL — ABNORMAL LOW (ref 2.5–4.6)
Phosphorus: 3.1 mg/dL (ref 2.5–4.6)
Potassium: 4.6 mmol/L (ref 3.5–5.1)
Potassium: 4.6 mmol/L (ref 3.5–5.1)
SODIUM: 133 mmol/L — AB (ref 135–145)
Sodium: 132 mmol/L — ABNORMAL LOW (ref 135–145)

## 2017-07-20 LAB — MAGNESIUM: Magnesium: 2.2 mg/dL (ref 1.7–2.4)

## 2017-07-20 LAB — HEPARIN LEVEL (UNFRACTIONATED)
HEPARIN UNFRACTIONATED: 0.37 [IU]/mL (ref 0.30–0.70)
HEPARIN UNFRACTIONATED: 0.26 [IU]/mL — AB (ref 0.30–0.70)
Heparin Unfractionated: 0.15 IU/mL — ABNORMAL LOW (ref 0.30–0.70)

## 2017-07-20 LAB — CBC
HEMATOCRIT: 25.3 % — AB (ref 39.0–52.0)
Hemoglobin: 7.7 g/dL — ABNORMAL LOW (ref 13.0–17.0)
MCH: 29.2 pg (ref 26.0–34.0)
MCHC: 30.4 g/dL (ref 30.0–36.0)
MCV: 95.8 fL (ref 78.0–100.0)
Platelets: 136 10*3/uL — ABNORMAL LOW (ref 150–400)
RBC: 2.64 MIL/uL — ABNORMAL LOW (ref 4.22–5.81)
RDW: 17.6 % — AB (ref 11.5–15.5)
WBC: 12.5 10*3/uL — ABNORMAL HIGH (ref 4.0–10.5)

## 2017-07-20 LAB — COOXEMETRY PANEL
Carboxyhemoglobin: 2.6 % — ABNORMAL HIGH (ref 0.5–1.5)
Methemoglobin: 0.8 % (ref 0.0–1.5)
O2 SAT: 60.6 %
TOTAL HEMOGLOBIN: 8.4 g/dL — AB (ref 12.0–16.0)

## 2017-07-20 LAB — PROTIME-INR
INR: 2.1
Prothrombin Time: 23.9 seconds — ABNORMAL HIGH (ref 11.4–15.2)

## 2017-07-20 MED ORDER — DARBEPOETIN ALFA 60 MCG/0.3ML IJ SOSY
60.0000 ug | PREFILLED_SYRINGE | INTRAMUSCULAR | Status: DC
  Administered 2017-07-20: 60 ug via SUBCUTANEOUS
  Filled 2017-07-20: qty 0.3

## 2017-07-20 NOTE — Progress Notes (Signed)
PULMONARY / CRITICAL CARE MEDICINE   Name: [name] MRN: [id number] DOB: Dec 05, 1956    ADMISSION DATE:  07/09/2017   CONSULTATION DATE:  07/10/2017  REFERRING MD:  Sarajane Jews  CHIEF COMPLAINT:  dyspnea  HISTORY OF PRESENT ILLNESS:  61 y.o. male with history of ischemic cardiomyopathy status post ICD placement. Also history of chronic renal failure. Admitted on 8/4 with acute respiratory failure secondary to pulmonary edema. Subsequently found to have a MSSA bacteremia. Patient has had persistent shock requiring vasopressor support and is on renal replacement therapy.   SUBJECTIVE:  No acute events overnight. Remains on pressors with ongoing CVVHD. Denies any chest pain or pressure. No dyspnea or cough.  REVIEW OF SYSTEMS:  Denies any abdominal pain or nausea. No headache or vision changes. No subjective fever or chills. Joint pain is minimal.  VITAL SIGNS: BP (!) 114/54   Pulse 73   Temp 98.4 F (36.9 C) (Oral)   Resp 15   Ht 5\' 10"  (1.778 m)   Wt 182 lb 15.7 oz (83 kg)   SpO2 100%   BMI 26.26 kg/m   HEMODYNAMICS: CVP:  [11 mmHg-14 mmHg] 11 mmHg  VENTILATOR SETTINGS:    INTAKE / OUTPUT: I/O last 3 completed shifts: In: 938.7 [P.O.:160; I.V.:478.7; IV Piggyback:300] Out: 5784 [Urine:404; Other:3920]  PHYSICAL EXAMINATION: General:  Awake. Wife at bedside. No distress. Integument:  No rash on exposed skin. Warm. Dry. HEENT:  Dry mucous membranes. No scleral icterus. No scleral injection. Cardiovascular: Regular rate. Paced rhythm on telemetry. Pulmonary: Normal work of breathing on room air. Clear to auscultation bilaterally. Abdomen: Nontender. Nondistended. Normal bowel sounds. Neurological: Oriented 4. No meningismus. Grossly nonfocal.  LABS:  BMET  Recent Labs Lab 07/19/17 0451 07/19/17 1625 07/20/17 0425  NA 132* 133* 133*  K 4.4 4.6 4.6  CL 100* 100* 100*  CO2 25 28 27   BUN 17 15 13   CREATININE 1.17 1.14 1.10  GLUCOSE 172* 211* 145*     Electrolytes  Recent Labs Lab 07/18/17 0339  07/19/17 0451 07/19/17 1625 07/20/17 0425 07/20/17 0428  CALCIUM 8.3*  < > 8.1* 8.1* 8.1*  --   MG 2.3  --  2.3  --   --  2.2  PHOS 2.7  < > 2.6 2.5 2.4*  --   < > = values in this interval not displayed.  CBC  Recent Labs Lab 07/18/17 0339 07/19/17 0451 07/20/17 0428  WBC 14.2* 15.0* 12.5*  HGB 7.6* 7.9* 7.7*  HCT 24.5* 26.2* 25.3*  PLT 121* 145* 136*    Coag's  Recent Labs Lab 07/18/17 0339 07/19/17 0451 07/20/17 0428  INR 3.50 2.27 2.10    Sepsis Markers No results for input(s): LATICACIDVEN, PROCALCITON, O2SATVEN in the last 168 hours.  ABG No results for input(s): PHART, PCO2ART, PO2ART in the last 168 hours.  Liver Enzymes  Recent Labs Lab 07/15/17 0350  07/19/17 0451 07/19/17 1625 07/20/17 0425  AST 40  --   --   --   --   ALT 7*  --   --   --   --   ALKPHOS 147*  --   --   --   --   BILITOT 3.6*  --   --   --   --   ALBUMIN 1.7*  < > 2.2* 2.0* 2.0*  < > = values in this interval not displayed.  Cardiac Enzymes No results for input(s): TROPONINI, PROBNP in the last 168 hours.  Glucose  Recent Labs  Lab 07/18/17 2126 07/19/17 0759 07/19/17 1144 07/19/17 1601 07/19/17 2129 07/20/17 0757  GLUCAP 149* 158* 166* 179* 164* 144*    Imaging No results found.   IMAGING/STUDIES: CT ABD/PELVIS W/O 8/5: IMPRESSION: 1. Cirrhosis. Moderate ascites. Anasarca. 2. Diverting colostomy without bowel obstruction. Similar thickened rectum compatible with treatment related changes. 3. Mild cardiomegaly. Moderate RIGHT and small LEFT pleural effusions. TTE 8/5:  LV mildly dilated with mild concentric hypertrophy. EF 15-20% with diffuse hypokinesis and apical akinesis. Not sufficient to allow assessment of diastolic function. LA mildly dilated & RA normal in size. RV normal in size and function. Pacer wire noted within right ventricle. No aortic stenosis with moderate regurgitation. Aortic root  normal in size. Mild mitral regurgitation without stenosis. Flattening of the intraventricular septum consistent with right ventricular pressure and volume overload. Mild pulmonic regurgitation without stenosis. Severe tricuspid regurgitation. No pericardial effusion. Study showed no vegetations but image quality was poor and study was technically difficult. CT R HIP W/O 8/6: IMPRESSION: 1. No acute fracture nor dislocation of either hip. No evidence of AVN. Probable right-sided gluteal tendinopathy. 2. The bones are demineralized in appearance. There is generalized muscle atrophy about the right hip. 3. Anasarca with moderate abdominopelvic ascites. 4. Moderate to large bilateral hydroceles. PORT CXR 8/6:  Previously reviewed by me. Right internal jugular central venous catheter into position. Stable volume loss on the right. No new focal opacity. Implanted device within the left chest wall with leads projecting to the heart. TEE 8/14:  EF 20% with diffuse hypokinesis. Moderate hypokinesis of RV. ICD/pacing wires noted without vegetation. Left atrium dilated without thrombus. Right atrium dilated and pacing wires without vegetation. Mild aortic insufficiency without vegetation. Mild mitral regurgitation without vegetation. Moderate to severe tricuspid regurgitation without vegetation. Pulmonic valve without vegetation. No PFO or ASD. No pericardial effusion.  MICROBIOLOGY: Blood Cultures x2 7/7:  Negative  MRSA PCR 8/5: Negative  Blood Cultures x2 8/4:  2/2 Bottles MSSA Urine Culture 8/4:  Negative  Blood Cultures x2 8/6:  Negative   ANTIBIOTICS: Vancomycin 8/5 Rifampin 8/6 - 8/7 Ancef 8/6 >>>  SIGNIFICANT EVENTS: 08/04 - Admit 08/05 - Started on CVVHD  LINES/TUBES: Foley (out) R IJ TEMP HD CATHETER 8/5 >>> PIV  ASSESSMENT / PLAN:  PULMONARY A: Acute hypoxic respiratory failure: Resolved post procedure. Pulmonary hypertension: Noted on echocardiogram. Likely  multifactorial. History of OSA  P:   Continuous pulse oximetry monitoring Nocturnal CPAP therapy  CARDIOVASCULAR A:  Ischemic cardio myopathy: Known history of coronary artery disease. Shock: Combination of sepsis and cardiogenic shock. Status post ICD: Currently with bacteremia. Paroxysmal atrial fibrillation  P:  Continuing to wean Levophed for MAP >65 Continuous telemetry monitoring Vitals per unit protocol Cardiology following & managing  RENAL A:   Hyponatremia: Mild and stable. Acute on chronic renal failure: Unknown prerenal state.  P:   Trending electrolytes every 12 hours Renal replacement therapy per cardiology & nephrology Replacing electrolytes as indicated Monitoring urine output   GASTROINTESTINAL A:   History of rectal cancer: Status post colostomy.  Liver cirrhosis  P:   Continuing diet  HEMATOLOGIC A:   Anemia: Hemoglobin remains stable. Thrombocytopenia:. Platelet count stable. Coagulopathy: Multifactorial from sepsis, antibiotics, Coumadin, and liver cirrhosis. Leukocytosis: Improving. Likely secondary to sepsis.  P:  Continuing to hold Coumadin Trending cell counts daily with CBC Trending INR daily Transfuse for Hgb <7.5 or active bleeding  INFECTIOUS A:   Sepsis MSSA bacteremia: Hemodialysis catheter placed 8/5. ICD in place. Possible source right  leg wound. TEE negative.  P:   ID following & appreciate input Continuing antibiotics as above  Plan for line holiday and removal of hardware as coordinated by cardiology and ID  ENDOCRINE A:   Hyperglycemia: Glucose controlled. Multifactorial with steroids.  P:   Continuing Lantus 10 units subcutaneous daily at bedtime Continuing Accu-Cheks before every meal & at bedtime Continuing sliding scale insulin per sensitive algorithm Continuing NovoLog 3 units with meals  NEUROLOGIC A:   Arthritis pain: Improving.  P:   Continuing when necessary Norco  ORTHO A: Achilles Tendon  Pain Arthritis/Hip Pain  P: Monitor  Prophylaxis:  SCDs & systemic anticoagulation. Diet:  Heart Healthy Carb Modified.  Code Status:  Full Code per previous physician discussions. Disposition:  Continuing to wean vasopressor support while critically ill.  Family Update:  Patient updated during rounds today  DISCUSSION:  61 y.o.  male with shock that is stable and likely due to sepsis, cardiogenic shock, and ongoing continuous renal replacement therapy with diuresis.  I have spent a total of 31 minutes of critical care time today caring for the patient, updating the patient at bedside, and reviewing the patient's electronic medical record.   Sonia Baller Ashok Cordia, M.D. [facility] Pager:  [phone] After 3pm or if no response, call [phone] 07/20/2017, 9:47 AM

## 2017-07-20 NOTE — Progress Notes (Signed)
ANTICOAGULATION CONSULT NOTE - Follow Up Consult  Pharmacy Consult for heparin Indication: atrial fibrillation  Labs:  Recent Labs  07/18/17 0339  07/19/17 0451 07/19/17 1625 07/20/17 0425 07/20/17 0428 07/20/17 1207 07/20/17 1544 07/20/17 2013  HGB 7.6*  --  7.9*  --   --  7.7*  --   --   --   HCT 24.5*  --  26.2*  --   --  25.3*  --   --   --   PLT 121*  --  145*  --   --  136*  --   --   --   LABPROT 36.0*  --  25.4*  --   --  23.9*  --   --   --   INR 3.50  --  2.27  --   --  2.10  --   --   --   HEPARINUNFRC  --   --   --   --  0.15*  --  0.37  --  0.26*  CREATININE 1.30*  < > 1.17 1.14 1.10  --   --  1.37*  --   < > = values in this interval not displayed.  Assessment: 60yo male subtherapeutic on heparin with initial dosing while Coumadin on hold. PM heparin level = 0.26  Goal of Therapy:  Heparin level 0.3-0.7 units/ml   Plan:  Increase heparin to 1650 units / hr Follow up AM labs  Thank you Anette Guarneri, PharmD [phone] 07/20/2017,9:31 PM

## 2017-07-20 NOTE — Progress Notes (Signed)
Patient ID: [name], male   DOB: 04/15/1956, 61 y.o.   MRN: [id number]     Advanced Heart Failure Rounding Note  Primary Cardiologist: Inman Fettig  Subjective:    Admitted with fatigue, dyspnea, recurrent lower extremity swelling and decreased UOP, cardiogenic shock.   TEE 07/19/17 with LVEF 20%. No evidence of endocarditis or vegetation on valves/ICD  Remains CRRT with UF ~ 100 cc per hour. Remains on 6 of Norepi.   Averaging 250-300 cc of UO daily. CVP 10-11. Weight unchanged.   Feeling better today. Denies lightheadedness or dizziness. Sat in chair for 4 hours yesterday.    Blood CX- 2/2 MSSA ? From LLE wounds?Marland Kitchen On cefazolin.   Objective:   Weight Range: 182 lb 15.7 oz (83 kg) Body mass index is 26.26 kg/m.   Vital Signs:   Temp:  [97.3 F (36.3 C)-98.4 F (36.9 C)] 98.4 F (36.9 C) (08/15 0803) Pulse Rate:  [63-105] 74 (08/15 0800) Resp:  [9-22] 12 (08/15 0800) BP: (67-120)/(38-85) 106/85 (08/15 0800) SpO2:  [72 %-100 %] 100 % (08/15 0800) Weight:  [182 lb 15.7 oz (83 kg)] 182 lb 15.7 oz (83 kg) (08/15 0500) Last BM Date: 07/13/17  Weight change: Filed Weights   07/18/17 0430 07/19/17 0449 07/20/17 0500  Weight: 183 lb 10.3 oz (83.3 kg) 182 lb 12.2 oz (82.9 kg) 182 lb 15.7 oz (83 kg)    Intake/Output:   Intake/Output Summary (Last 24 hours) at 07/20/17 0842 Last data filed at 07/20/17 0800  Gross per 24 hour  Intake           757.75 ml  Output             2960 ml  Net         -2202.25 ml      Physical Exam   CVP 10-11  General: Chronically ill appearing. No resp difficulty. HEENT: Normal Neck: Supple. JVP 10-11 cm. Carotids 2+ bilat; no bruits. No thyromegaly or nodule noted. Cor: PMI nondisplaced. RRR, 2/6 HSM LLSB.  Lungs: CTAB, normal effort. Abdomen: Soft, non-tender, non-distended, no HSM. No bruits or masses. +BS  Extremities: No cyanosis, clubbing, or rash. Trace to 1+ edema to knees.   Neuro: Alert & orientedx3, cranial nerves  grossly intact. moves all 4 extremities w/o difficulty. Affect pleasant   Telemetry   Personally reviewed, BiV pacing 70s, underlying Afib.   Labs    CBC  Recent Labs  07/19/17 0451 07/20/17 0428  WBC 15.0* 12.5*  HGB 7.9* 7.7*  HCT 26.2* 25.3*  MCV 94.9 95.8  PLT 145* 976*   Basic Metabolic Panel  Recent Labs  07/19/17 0451 07/19/17 1625 07/20/17 0425 07/20/17 0428  NA 132* 133* 133*  --   K 4.4 4.6 4.6  --   CL 100* 100* 100*  --   CO2 25 28 27   --   GLUCOSE 172* 211* 145*  --   BUN 17 15 13   --   CREATININE 1.17 1.14 1.10  --   CALCIUM 8.1* 8.1* 8.1*  --   MG 2.3  --   --  2.2  PHOS 2.6 2.5 2.4*  --    Liver Function Tests  Recent Labs  07/19/17 1625 07/20/17 0425  ALBUMIN 2.0* 2.0*   No results for input(s): LIPASE, AMYLASE in the last 72 hours. Cardiac Enzymes No results for input(s): CKTOTAL, CKMB, CKMBINDEX, TROPONINI in the last 72 hours.  BNP: BNP (last 3 results)  Recent Labs  06/13/17 1205 07/09/17  2151  BNP 1,151.0* 1,553.9*    ProBNP (last 3 results) No results for input(s): PROBNP in the last 8760 hours.   D-Dimer No results for input(s): DDIMER in the last 72 hours. Hemoglobin A1C No results for input(s): HGBA1C in the last 72 hours. Fasting Lipid Panel No results for input(s): CHOL, HDL, LDLCALC, TRIG, CHOLHDL, LDLDIRECT in the last 72 hours. Thyroid Function Tests No results for input(s): TSH, T4TOTAL, T3FREE, THYROIDAB in the last 72 hours.  Invalid input(s): FREET3  Other results:   Imaging   No results found.  Medications:     Scheduled Medications: . Chlorhexidine Gluconate Cloth  6 each Topical Daily  . colchicine  0.3 mg Oral Daily  . insulin aspart  0-5 Units Subcutaneous QHS  . insulin aspart  0-9 Units Subcutaneous TID WC  . insulin aspart  3 Units Subcutaneous TID WC  . insulin glargine  10 Units Subcutaneous QHS  . sodium chloride flush  10-40 mL Intracatheter Q12H    Infusions: . sodium  chloride 250 mL (07/20/17 0700)  . sodium chloride 10 mL/hr at 07/20/17 0608  .  ceFAZolin (ANCEF) IV Stopped (07/20/17 0603)  . heparin 1,500 Units/hr (07/20/17 0700)  . heparin 999 mL (07/17/17 2143)  . norepinephrine (LEVOPHED) Adult infusion 5 mcg/min (07/20/17 0700)  . dialysis replacement fluid (prismasate) 300 mL/hr at 07/20/17 0813  . dialysis replacement fluid (prismasate) 200 mL/hr at 07/20/17 0735  . dialysate (PRISMASATE) 1,000 mL/hr at 07/20/17 0608    PRN Medications: sodium chloride, acetaminophen, alum & mag hydroxide-simeth, fentaNYL (SUBLIMAZE) injection, heparin, heparin, heparin, HYDROcodone-acetaminophen, magic mouthwash w/lidocaine, ondansetron (ZOFRAN) IV, sodium chloride flush    Patient Profile   [name] is a 61 year old with a history of DMII, [place] 2016, rectal cancer, CKD Stage IV, chronic A fib, S/P AVN ablation, PAF, CAD BMS LAD 2011 BMS LAD 2009, OSA, chronic systolic heart failure, St Jude BiV ICD admitted with AKI and shock with elevated lactate, suspect cardiogenic.   Assessment/Plan   1. Acute on chronic systolic CHF/cardiogenic shock: Last echo in 8/17 with EF 15-20%.  Ischemic cardiomyopathy, has St Jude BiV ICD (despite chronic afib appears to be effectively BiV pacing).  Had recent admission (discharged 06/21/17) requiring milrinone support and diuresis.  Admitted with soft BP (SBP 80s-90s) + elevated lactate.  Suspect mixed septic/cardiogenic shock with MSSA bacteremia.  CVVH begun and ongoing.   - CVP 10-11. Continue CRRT. Weight remains above perceived baseline of ~ 175 lbs.  - Co-ox 60.6% today.    - Continue CVVH per renal, UF 100 cc/hr.   - Continue norepinephrine at 6 currently. Wean as tolerated.    2. AKI on CKD stage 4: Baseline creatinine around 2.5.  He required brief HD in the past, has RUE fistula. - Started CRRT 8/6. Nephrology appreciated. Long-term HD would be difficult. Tolerating CRRT so far. 3. Atrial fibrillation: Chronic.   Warfarin on hold, INR 5.23 => 4.83 => 3.50 => 2.27 => 2.10 4. CAD:  - No CP. Statin on hold for now. Holding ASA.     5. ID: WBC trend 29>24>18.6>14>15>14.2>15.0. > 12.5 - Blood CX 2/2 MSSA. Antibiotics narrowed, now on Ancef. 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  - EP following. Will eventually need extraction (and temporary-permanent PPM). Dr Lovena Le is on vacation so he would need transfer [facility] if needs soon.   - TEE 07/19/17 with no evidence of endocarditis.   - Per Dr. Eydan Medal with ID note, pt  needs line holiday and eventual ICD extraction for total cure.  6. H/O Rectal CA with diverting colostomy  7. Type II diabetes: Sliding scale.  - No change.   8. Liver failure:  - LFTs stable. Off statin.   Length of Stay: [address]  Annamaria Helling  07/20/2017, 8:42 AM  Advanced Heart Failure Team Pager [phone] (M-F; 7a - 4p)  Please contact [facility] for night-coverage after hours (4p -7a ) and weekends on [email or web address]  Patient seen and examined with the above-signed Advanced Practice Provider and/or Housestaff. I personally reviewed laboratory data, imaging studies and relevant notes. I independently examined the patient and formulated the important aspects of the plan. I have edited the note to reflect any of my changes or salient points. I have personally discussed the plan with the patient and/or family.  TEE yesterday without vegetation. Volume status continues to improve on CVVHD. Still requiring norepinephrine at 8 to maintain BP.  I discussed with Dr. Marval Regal (Renal). Will plan to stop CVVHD later today and assess for renal recovery. Wean NE as tolerated but keep MAPS >= 70 to assist with renal recovery.   Continue abx. Would not pull out ICD at this point. Would favor suppressive abx given how sick he is.   Continue OT  Glori Bickers, MD  12:06 PM

## 2017-07-20 NOTE — Progress Notes (Signed)
ANTICOAGULATION CONSULT NOTE - Follow Up Consult  Pharmacy Consult for heparin Indication: atrial fibrillation  Labs:  Recent Labs  07/18/17 0339 07/18/17 1531 07/19/17 0451 07/19/17 1625 07/20/17 0425 07/20/17 0428  HGB 7.6*  --  7.9*  --   --  7.7*  HCT 24.5*  --  26.2*  --   --  25.3*  PLT 121*  --  145*  --   --  136*  LABPROT 36.0*  --  25.4*  --   --  23.9*  INR 3.50  --  2.27  --   --  2.10  HEPARINUNFRC  --   --   --   --  0.15*  --   CREATININE 1.30* 1.26* 1.17 1.14  --   --     Assessment: 60yo male subtherapeutic on heparin with initial dosing while Coumadin on hold.  Goal of Therapy:  Heparin level 0.3-0.7 units/ml   Plan:  Will increase heparin gtt by 2-3 units/kg/hr to 1500 units/hr and check level in 6hr.  Wynona Neat, PharmD, BCPS  07/20/2017,5:41 AM

## 2017-07-20 NOTE — Telephone Encounter (Signed)
Letter mailed

## 2017-07-20 NOTE — Progress Notes (Signed)
ANTICOAGULATION CONSULT NOTE - Follow Up Consult  Pharmacy Consult for heparin Indication: atrial fibrillation  Labs:  Recent Labs  07/18/17 0339  07/19/17 0451 07/19/17 1625 07/20/17 0425 07/20/17 0428 07/20/17 1207  HGB 7.6*  --  7.9*  --   --  7.7*  --   HCT 24.5*  --  26.2*  --   --  25.3*  --   PLT 121*  --  145*  --   --  136*  --   LABPROT 36.0*  --  25.4*  --   --  23.9*  --   INR 3.50  --  2.27  --   --  2.10  --   HEPARINUNFRC  --   --   --   --  0.15*  --  0.37  CREATININE 1.30*  < > 1.17 1.14 1.10  --   --   < > = values in this interval not displayed.  Assessment: 60yo male subtherapeutic on heparin with initial dosing while Coumadin on hold. Level after dose increase was 0.37, within goal range. H/H and platelets are stable.   Goal of Therapy:  Heparin level 0.3-0.7 units/ml   Plan:  Will continue heparin at 1500 units/hr Will obtain heparin level at 2000 If within therapeutic range, would monitor with daily levels  Doylene Canard, PharmD Clinical Pharmacist  Pager: [phone]  07/20/2017,1:49 PM

## 2017-07-20 NOTE — Progress Notes (Signed)
Patient ID: [name], male   DOB: 1956/04/26, 61 y.o.   MRN: [id number] S:Feels well. Eating OK.   Interval Updates: TEE yesterday afternoon without vegetations. ID feels will still need line holiday and device extraction at some point. Remains on CRRT, pulling ~100 mls/hr, removed about 2.6 L yesterday. ~358mls UOP yest. Pressures OK, not able to wean much. Still on 6 mcgs Norepi.  O:BP 106/85 (BP Location: Left Arm)   Pulse 74   Temp 98.4 F (36.9 C) (Oral)   Resp 12   Ht 5\' 10"  (1.778 m)   Wt 182 lb 15.7 oz (83 kg)   SpO2 100%   BMI 26.26 kg/m   Intake/Output Summary (Last 24 hours) at 07/20/17 0832 Last data filed at 07/20/17 0800  Gross per 24 hour  Intake           757.75 ml  Output             2960 ml  Net         -2202.25 ml   Intake/Output: I/O last 3 completed shifts: In: 938.7 [P.O.:160; I.V.:478.7; IV Piggyback:300] Out: 4324 [Urine:404; Other:3920]  Intake/Output this shift:  Total I/O In: -  Out: 128 [Other:128] Weight change: 3.5 oz (0.1 kg) Gen:NAD, pleasant and conversant CVS:no rub Resp:cta GLO:VFIEPP Ext:no edema, RAVF +T/B HD access: R IJ HD cath   Recent Labs Lab 07/15/17 0350  07/17/17 0326 07/17/17 1600 07/18/17 0339 07/18/17 1531 07/19/17 0451 07/19/17 1625 07/20/17 0425  NA 132*  < > 132* 132* 132* 132* 132* 133* 133*  K 4.3  < > 4.3 4.6 4.5 4.3 4.4 4.6 4.6  CL 99*  < > 99* 99* 99* 99* 100* 100* 100*  CO2 25  < > 26 27 27 26 25 28 27   GLUCOSE 159*  < > 214* 246* 172* 175* 172* 211* 145*  BUN 31*  < > 22* 20 20 19 17 15 13   CREATININE 1.36*  < > 1.27* 1.26* 1.30* 1.26* 1.17 1.14 1.10  ALBUMIN 1.7*  < > 2.3* 2.3* 2.4* 2.2* 2.2* 2.0* 2.0*  CALCIUM 8.1*  < > 8.4* 8.1* 8.3* 8.0* 8.1* 8.1* 8.1*  PHOS 2.4*  < > 2.5 2.6 2.7 2.4* 2.6 2.5 2.4*  AST 40  --   --   --   --   --   --   --   --   ALT 7*  --   --   --   --   --   --   --   --   < > = values in this interval not displayed. Liver Function Tests:  Recent Labs Lab  07/15/17 0350  07/19/17 0451 07/19/17 1625 07/20/17 0425  AST 40  --   --   --   --   ALT 7*  --   --   --   --   ALKPHOS 147*  --   --   --   --   BILITOT 3.6*  --   --   --   --   PROT 6.5  --   --   --   --   ALBUMIN 1.7*  < > 2.2* 2.0* 2.0*  < > = values in this interval not displayed. No results for input(s): LIPASE, AMYLASE in the last 168 hours. No results for input(s): AMMONIA in the last 168 hours. CBC:  Recent Labs Lab 07/16/17 0324 07/17/17 0326 07/18/17 0339 07/19/17 0451 07/20/17 0428  WBC 14.3*  15.4* 14.2* 15.0* 12.5*  HGB 8.2* 7.9* 7.6* 7.9* 7.7*  HCT 27.0* 25.4* 24.5* 26.2* 25.3*  MCV 93.8 93.7 94.2 94.9 95.8  PLT 164 124* 121* 145* 136*  CBG:  Recent Labs Lab 07/19/17 0759 07/19/17 1144 07/19/17 1601 07/19/17 2129 07/20/17 0757  GLUCAP 158* 166* 179* 164* 144*   Iron Studies:  Iron/TIBC/Ferritin/ %Sat    Component Value Date/Time   IRON 28 (L) 07/12/2017 0424   TIBC 231 (L) 07/12/2017 0424   FERRITIN 238 07/12/2017 0424   IRONPCTSAT 12 (L) 07/12/2017 0424   IRONPCTSAT 17 02/24/2017 0915  ' Studies/Results: No results found. . Chlorhexidine Gluconate Cloth  6 each Topical Daily  . colchicine  0.3 mg Oral Daily  . insulin aspart  0-5 Units Subcutaneous QHS  . insulin aspart  0-9 Units Subcutaneous TID WC  . insulin aspart  3 Units Subcutaneous TID WC  . insulin glargine  10 Units Subcutaneous QHS  . sodium chloride flush  10-40 mL Intracatheter Q12H   Current CRRT Prescription: Pre-filter fluid: 4K/2.5 at 300 ml/hr Post-filter fluid: 4k/2.5 at 200 ml/hr Dialysate: BGK 4/2.5 at 1,000 mL/hr UF goal 50-100 ml/hr Heparin 1,000-6,000 units prn  A/P  1. Dialysis dependent AKI/CKD 4- in setting of MSSA bacteremia and ischemic cardiomyopathy/hypotension/volume overload.  Admitted 07/09/17 with respiratory failure and demand ischemia improving with initiation of CVVHD 8/6. Working on weaning pressors with eventual goal of IHD trial.  1. CVP 11,  on 6 mls/hr norepi 2. Continue CVVHD for now and hopefully can transition to IHD soon. 2. Acute on chronic CHF/ischemic CMP 3. MSSA bacteremia- unclear etiology, ?LE wounds. TEE CLEAR OF VEG 8/14. 1. Continue Ancef suppr therapy 4. Anemia- Hb 7.7 today.  1. Transfuse prn and will add ESA Aranesp sq every 2 weeks 2. Iron deficient however caution with repleting given bacteremia 5. Hyponatremia- due to CHF/anasarca improved with CVVHD and UF 6. Cirrhosis- elevated t bili and abnormal lft's off of statin and improving 7. Thrombocytopenia, Chronic- Likely related to cirrhosis. May need to stop heparin, continue to follow. 8. Atrial fib- per cards, currently on Heparin  Noemi Chapel, DO [facility] [phone]  I have seen and examined this patient and agree with plan and assessment in the above note with renal recommendations/intervention highlighted.   [name] is doing well and will stop CVVHD today and start IV Lasix and follow UOP and Scr.  Hopefully we can wean levophed further and he may not need ongoing IHD. Julien Nordmann Mcclellan Demarais,MD 07/20/2017 12:33 PM

## 2017-07-20 NOTE — Progress Notes (Signed)
Physical Therapy Treatment Patient Details Name: [name] MRN: [id number] DOB: 10/14/1956 Today's Date: 07/20/2017    History of Present Illness 61 yr old male with extensive PMHx MI, ICM, HFrEF EF15%-20% ,BiVICD, CKD Stage IV, DM, GERD, OSA on CPAP, PAF, presents with weight gain, lower extremity swelling and decreased urine output within the last week.   Work up included AOCsHF/cardiogenic shock, BiV MSSA, demand ischemia.  Pt continues on CRRT.    PT Comments    Patient is making progress toward mobility goals and tolerated short distance gait this session. +2 for OOB mobility. VSS. Patient is very motivated to mobilize and gain independence and has very supportive family. Current plan remains appropriate.   Follow Up Recommendations  CIR;Supervision/Assistance - 24 hour     Equipment Recommendations  Other (comment) (TBA)    Recommendations for Other Services Rehab consult     Precautions / Restrictions Precautions Precautions: Fall Precaution Comments: Watch BP Restrictions Weight Bearing Restrictions: No    Mobility  Bed Mobility Overal bed mobility: Needs Assistance Bed Mobility: Rolling;Sidelying to Sit Rolling: Min guard Sidelying to sit: Mod assist       General bed mobility comments: use of rail and HOB elevated; cues for sequencing and assist to elevate trunk into sitting  Transfers Overall transfer level: Needs assistance Equipment used:  (EVA walker) Transfers: Sit to/from Stand Sit to Stand: Mod assist;+2 physical assistance Stand pivot transfers: Min assist;+2 safety/equipment       General transfer comment: cues for safe hand placement and technique; assist to power up into standing and to gain balance upon stand  Ambulation/Gait Ambulation/Gait assistance: Mod assist;+2 safety/equipment Ambulation Distance (Feet):  (16ft total with seated rest break) Assistive device:  (EVA walker) Gait Pattern/deviations: Step-through  pattern;Decreased step length - right;Decreased step length - left;Drifts right/left;Trunk flexed;Narrow base of support     General Gait Details: cues for posture; pt with increased trunk flexion with fatigue; assist for balance and management of AD; VSS   Stairs            Wheelchair Mobility    Modified Rankin (Stroke Patients Only)       Balance Overall balance assessment: Needs assistance Sitting-balance support: Feet supported;Single extremity supported;Bilateral upper extremity supported Sitting balance-Leahy Scale: Fair Sitting balance - Comments: sits EOB with min guard assist    Standing balance support: Bilateral upper extremity supported Standing balance-Leahy Scale: Poor Standing balance comment: requires bil. UE support and min a for static standing                             Cognition Arousal/Alertness: Awake/alert Behavior During Therapy: WFL for tasks assessed/performed Overall Cognitive Status: Within Functional Limits for tasks assessed                                 General Comments: WFL for basic info       Exercises      General Comments General comments (skin integrity, edema, etc.): VSS       Pertinent Vitals/Pain Pain Assessment: Faces Faces Pain Scale: Hurts even more Pain Location: R hip with mobility/weightbearing Pain Descriptors / Indicators: Aching;Grimacing;Guarding Pain Intervention(s): Repositioned    Home Living                      Prior Function  PT Goals (current goals can now be found in the care plan section) Acute Rehab PT Goals PT Goal Formulation: With patient Time For Goal Achievement: 07/29/17 Potential to Achieve Goals: Good Progress towards PT goals: Progressing toward goals    Frequency    Min 3X/week      PT Plan Current plan remains appropriate    Co-evaluation PT/OT/SLP Co-Evaluation/Treatment: Yes Reason for Co-Treatment: Complexity of the  patient's impairments (multi-system involvement);For patient/therapist safety PT goals addressed during session: Mobility/safety with mobility OT goals addressed during session: Strengthening/ROM      AM-PAC PT "6 Clicks" Daily Activity  Outcome Measure  Difficulty turning over in bed (including adjusting bedclothes, sheets and blankets)?: Total Difficulty moving from lying on back to sitting on the side of the bed? : Total Difficulty sitting down on and standing up from a chair with arms (e.g., wheelchair, bedside commode, etc,.)?: Total Help needed moving to and from a bed to chair (including a wheelchair)?: A Lot Help needed walking in hospital room?: A Lot Help needed climbing 3-5 steps with a railing? : A Lot 6 Click Score: 9    End of Session Equipment Utilized During Treatment: Oxygen;Gait belt Activity Tolerance: Patient tolerated treatment well Patient left: in chair;with call bell/phone within reach;with nursing/sitter in room;with family/visitor present Nurse Communication: Mobility status PT Visit Diagnosis: Other abnormalities of gait and mobility (R26.89);Muscle weakness (generalized) (M62.81);Pain Pain - Right/Left: Right Pain - part of body: Hip     Time: 6378-5885 PT Time Calculation (min) (ACUTE ONLY): 30 min  Charges:  $Gait Training: 8-22 mins                    G Codes:       Earney Navy, PTA Pager: [phone]     Darliss Cheney 07/20/2017, 5:07 PM

## 2017-07-20 NOTE — Progress Notes (Signed)
Occupational Therapy Treatment Patient Details Name: [name] MRN: [id number] DOB: [birth year] Today's Date: 07/20/2017    History of present illness 61 yr old male with extensive PMHx MI, ICM, HFrEF EF15%-20% ,BiVICD, CKD Stage IV, DM, GERD, OSA on CPAP, PAF, presents with weight gain, lower extremity swelling and decreased urine output within the last week.   Work up included AOCsHF/cardiogenic shock, BiV MSSA, demand ischemia.  Pt continues on CRRT.   OT comments  Pt seen with PT.   He was able to ambulate ~64ft, seated rest break, then ~31ft before fatiguing.   He is very motivated to regain his independence, and family is very supportive.  Will continue to follow.  Feel he will eventually need CIR.   Follow Up Recommendations  CIR;Supervision/Assistance - 24 hour    Equipment Recommendations  3 in 1 bedside commode;Tub/shower bench    Recommendations for Other Services Rehab consult    Precautions / Restrictions Precautions Precautions: Fall Precaution Comments: Watch BP       Mobility Bed Mobility Overal bed mobility: Needs Assistance Bed Mobility: Supine to Sit   Sidelying to sit: Mod assist       General bed mobility comments: cues for technique and assist to lift trunk   Transfers Overall transfer level: Needs assistance Equipment used: Rolling walker (2 wheeled) Transfers: Sit to/from [facility] Sit to Stand: Mod assist;+2 physical assistance Stand pivot transfers: Min assist;+2 safety/equipment       General transfer comment: Assist to power up into standing and assist for balance     Balance Overall balance assessment: Needs assistance Sitting-balance support: Feet supported;Single extremity supported;Bilateral upper extremity supported Sitting balance-Leahy Scale: Fair Sitting balance - Comments: sits EOB with min guard assist    Standing balance support: Bilateral upper extremity supported Standing balance-Leahy Scale:  Poor Standing balance comment: requires bil. UE support and min a for static standing                            ADL either performed or assessed with clinical judgement   ADL Overall ADL's : Needs assistance/impaired Eating/Feeding: Bed level;Set up                       Toilet Transfer: Moderate assistance;+2 for physical assistance;Ambulation;Comfort height toilet;BSC;Grab bars;RW   Toileting- Clothing Manipulation and Hygiene: Total assistance;Sit to/from stand       Functional mobility during ADLs: Moderate assistance;+2 for physical assistance;+2 for safety/equipment       Vision       Perception     Praxis      Cognition Arousal/Alertness: Awake/alert Behavior During Therapy: WFL for tasks assessed/performed Overall Cognitive Status: Within Functional Limits for tasks assessed                                 General Comments: WFL for basic info         Exercises     Shoulder Instructions       General Comments VSS     Pertinent Vitals/ Pain       Pain Assessment: Faces Faces Pain Scale: Hurts even more Pain Location: R hip with mobility/weightbearing Pain Descriptors / Indicators: Aching;Grimacing;Guarding Pain Intervention(s): Repositioned  Home Living  Prior Functioning/Environment              Frequency  Min 2X/week        Progress Toward Goals  OT Goals(current goals can now be found in the care plan section)  Progress towards OT goals: Progressing toward goals     Plan Discharge plan remains appropriate    Co-evaluation    PT/OT/SLP Co-Evaluation/Treatment: Yes Reason for Co-Treatment: Complexity of the patient's impairments (multi-system involvement);For patient/therapist safety   OT goals addressed during session: Strengthening/ROM      AM-PAC PT "6 Clicks" Daily Activity     Outcome Measure   Help from another person  eating meals?: A Little Help from another person taking care of personal grooming?: A Little Help from another person toileting, which includes using toliet, bedpan, or urinal?: A Lot Help from another person bathing (including washing, rinsing, drying)?: A Lot Help from another person to put on and taking off regular upper body clothing?: Total Help from another person to put on and taking off regular lower body clothing?: Total 6 Click Score: 12    End of Session Equipment Utilized During Treatment: Rolling walker  OT Visit Diagnosis: Unsteadiness on feet (R26.81);Muscle weakness (generalized) (M62.81)   Activity Tolerance Patient limited by fatigue   Patient Left in chair;with call bell/phone within reach;with family/visitor present   Nurse Communication Mobility status        Time: 0109-3235 OT Time Calculation (min): 29 min  Charges: OT General Charges $OT Visit: 1 Procedure OT Treatments $Therapeutic Activity: 8-22 mins  [facility], OTR/L [phone]    Lucille Passy M 07/20/2017, 4:18 PM

## 2017-07-20 NOTE — Consult Note (Addendum)
[facility] Nurse wound consult note Reason for Consult:bilateral pre tibial wounds, partial thickness Wound type:venous stasis Pressure Injury POA:NA Measurement:Left pretibial proximal to distal, 6cm x 4cm x 0.1cm 4cm x 3.5cm x 0.1cm Right pretibial proximal to distal, 3cm x 2cm x 0.1cm 2cm x 1.5cm x 0.1cm Medial just proximal to ankle 1cm x 1cm x 0.1cm Wound QJJ:HERD Drainage (amount, consistency, odor) small amt sanguineous no odor Periwound: peeling skin, pt states "legs were swollen", no edema now Dressing procedure/placement/frequency: I have provided nurses with orders for Clean bilateral lower leg wounds with NS, pat dry, cover wounds with Xeroform, wrap with kerlix, adhere kerlix to kerlix, no tape on skin, perform daily. Legs did have multiple foam dressings with peeling up edges that had skin peeled off with them. Skin can not tolerate any adhesive. We will not follow, but will remain available to this patient, to nursing, and the medical and/or surgical teams.  Please re-consult if we need to assist further.   Fara Olden, RN-C, WTA-, [facility] Wound Treatment Associate

## 2017-07-20 NOTE — Progress Notes (Signed)
Chaplain stopped in and met the patient and his wife.  Chaplain allowed wife to tell stories regarding her husband's health which began with heart attack in 2001.  She is thankful for what she believes the Lord is doing in his health.  Patient says he is a "walking testimony".  He says people can't believe all he has gone through.  Family is continuing to talk about the daily progress they are seeing.  Chaplain provided ministry of presence and continued encouragement for one day at a time improvement which supports their belief.    Chaplain would like to thank the medical team for their support and care of this patient.    07/20/17 1639  Clinical Encounter Type  Visited With Patient and family together  Visit Type Initial;Psychological support;Spiritual support;Social support;Post-op  Referral From Patient  Consult/Referral To Chaplain   

## 2017-07-20 NOTE — Telephone Encounter (Signed)
RECALL FOR ULTRASOUND 

## 2017-07-21 LAB — COOXEMETRY PANEL
CARBOXYHEMOGLOBIN: 1.8 % — AB (ref 0.5–1.5)
METHEMOGLOBIN: 1.2 % (ref 0.0–1.5)
O2 SAT: 54.4 %
Total hemoglobin: 11.1 g/dL — ABNORMAL LOW (ref 12.0–16.0)

## 2017-07-21 LAB — GLUCOSE, CAPILLARY
GLUCOSE-CAPILLARY: 90 mg/dL (ref 65–99)
Glucose-Capillary: 122 mg/dL — ABNORMAL HIGH (ref 65–99)
Glucose-Capillary: 100 mg/dL — ABNORMAL HIGH (ref 65–99)
Glucose-Capillary: 154 mg/dL — ABNORMAL HIGH (ref 65–99)

## 2017-07-21 LAB — RENAL FUNCTION PANEL
ANION GAP: 7 (ref 5–15)
Albumin: 1.9 g/dL — ABNORMAL LOW (ref 3.5–5.0)
Albumin: 1.8 g/dL — ABNORMAL LOW (ref 3.5–5.0)
Anion gap: 7 (ref 5–15)
BUN: 21 mg/dL — AB (ref 6–20)
BUN: 27 mg/dL — ABNORMAL HIGH (ref 6–20)
CHLORIDE: 99 mmol/L — AB (ref 101–111)
CO2: 25 mmol/L (ref 22–32)
CO2: 25 mmol/L (ref 22–32)
CREATININE: 1.79 mg/dL — AB (ref 0.61–1.24)
Calcium: 8.1 mg/dL — ABNORMAL LOW (ref 8.9–10.3)
Calcium: 8.1 mg/dL — ABNORMAL LOW (ref 8.9–10.3)
Chloride: 98 mmol/L — ABNORMAL LOW (ref 101–111)
Creatinine, Ser: 2.02 mg/dL — ABNORMAL HIGH (ref 0.61–1.24)
GFR calc Af Amer: 45 mL/min — ABNORMAL LOW (ref >60)
GFR calc non Af Amer: 39 mL/min — ABNORMAL LOW (ref >60)
GFR calc non Af Amer: 34 mL/min — ABNORMAL LOW (ref >60)
GFR, EST AFRICAN AMERICAN: 39 mL/min — AB (ref >60)
GLUCOSE: 103 mg/dL — AB (ref 65–99)
Glucose, Bld: 142 mg/dL — ABNORMAL HIGH (ref 65–99)
POTASSIUM: 4.6 mmol/L (ref 3.5–5.1)
POTASSIUM: 4.5 mmol/L (ref 3.5–5.1)
Phosphorus: 3.3 mg/dL (ref 2.5–4.6)
Phosphorus: 4 mg/dL (ref 2.5–4.6)
Sodium: 131 mmol/L — ABNORMAL LOW (ref 135–145)
Sodium: 130 mmol/L — ABNORMAL LOW (ref 135–145)

## 2017-07-21 LAB — PROTIME-INR
INR: 1.73
PROTHROMBIN TIME: 20.5 s — AB (ref 11.4–15.2)

## 2017-07-21 LAB — MAGNESIUM: Magnesium: 2.2 mg/dL (ref 1.7–2.4)

## 2017-07-21 LAB — CBC
HEMATOCRIT: 24.2 % — AB (ref 39.0–52.0)
Hemoglobin: 7.6 g/dL — ABNORMAL LOW (ref 13.0–17.0)
MCH: 29.7 pg (ref 26.0–34.0)
MCHC: 31.4 g/dL (ref 30.0–36.0)
MCV: 94.5 fL (ref 78.0–100.0)
PLATELETS: 126 10*3/uL — AB (ref 150–400)
RBC: 2.56 MIL/uL — ABNORMAL LOW (ref 4.22–5.81)
RDW: 17.6 % — AB (ref 11.5–15.5)
WBC: 13.8 10*3/uL — AB (ref 4.0–10.5)

## 2017-07-21 LAB — HEPARIN LEVEL (UNFRACTIONATED): HEPARIN UNFRACTIONATED: 0.41 [IU]/mL (ref 0.30–0.70)

## 2017-07-21 MED ORDER — FUROSEMIDE 10 MG/ML IJ SOLN
80.0000 mg | Freq: Two times a day (BID) | INTRAMUSCULAR | Status: DC
  Administered 2017-07-21 (×2): 80 mg via INTRAVENOUS
  Filled 2017-07-21 (×2): qty 8

## 2017-07-21 MED ORDER — FUROSEMIDE 10 MG/ML IJ SOLN: 80.0000 mg | Freq: Two times a day (BID) | INTRAMUSCULAR | Status: DC

## 2017-07-21 NOTE — Progress Notes (Signed)
Patient ID: [name], male   DOB: [birth year], 61 y.o.   MRN: [id number]     Advanced Heart Failure Rounding Note  Primary Cardiologist: Shalamar Plourde  Subjective:    Admitted with staph bacteremia with mixed septic/cardiogenic shock.   TEE 07/19/17 with LVEF 20%. No evidence of endocarditis or vegetation on valves/ICD  CVVD stopped yesterday.  Creatinine 1.3 -> 1.7 . NE down to 4. Co-ox down to 54%  MAPs 60-70   Objective:   Weight Range: 84.1 kg (185 lb 6.5 oz) Body mass index is 26.6 kg/m.   Vital Signs:   Temp:  [97.5 F (36.4 C)-98.3 F (36.8 C)] 98.3 F (36.8 C) (08/16 0406) Pulse Rate:  [69-78] 77 (08/16 0800) Resp:  [9-24] 9 (08/16 0800) BP: (77-126)/(34-90) 114/71 (08/16 0800) SpO2:  [97 %-100 %] 100 % (08/16 0800) Weight:  [84.1 kg (185 lb 6.5 oz)] 84.1 kg (185 lb 6.5 oz) (08/16 0400) Last BM Date: 07/20/17  Weight change: Filed Weights   07/19/17 0449 07/20/17 0500 07/21/17 0400  Weight: 82.9 kg (182 lb 12.2 oz) 83 kg (182 lb 15.7 oz) 84.1 kg (185 lb 6.5 oz)    Intake/Output:   Intake/Output Summary (Last 24 hours) at 07/21/17 0858 Last data filed at 07/21/17 0800  Gross per 24 hour  Intake          1201.03 ml  Output              967 ml  Net           234.03 ml      Physical Exam   CVP 10-11  General: Lying in bed Chronically ill appearing. No resp difficulty. HEENT: Normal Neck: Supple. RIJ trialysis cath JVP to jaw. No thyromegaly or nodule noted. Cor: PMI nondisplaced.RRR 2/6 SEM RUSB and LUSB Lungs: Clear anteriorly  Abdomen: Soft, non-tender, non-distended, no HSM. No bruits or masses. +BS  Extremities: No cyanosis, clubbing, or rash. Wounds on LE wrapped  1+ edema Neuro: alert & oriented x 3, cranial nerves grossly intact. moves all 4 extremities w/o difficulty. Affect pleasant   Telemetry   BiV pacing 70s, underlying Afib. Personally reviewed   Labs    CBC  Recent Labs  07/20/17 0428 07/21/17 0408  WBC 12.5* 13.8*  HGB  7.7* 7.6*  HCT 25.3* 24.2*  MCV 95.8 94.5  PLT 136* 126*   Basic Metabolic Panel  Recent Labs  07/20/17 0428 07/20/17 1544 07/21/17 0408  NA  --  132* 131*  K  --  4.6 4.6  CL  --  100* 99*  CO2  --  25 25  GLUCOSE  --  149* 142*  BUN  --  17 21*  CREATININE  --  1.37* 1.79*  CALCIUM  --  8.1* 8.1*  MG 2.2  --  2.2  PHOS  --  3.1 3.3   Liver Function Tests  Recent Labs  07/20/17 1544 07/21/17 0408  ALBUMIN 2.0* 1.9*   No results for input(s): LIPASE, AMYLASE in the last 72 hours. Cardiac Enzymes No results for input(s): CKTOTAL, CKMB, CKMBINDEX, TROPONINI in the last 72 hours.  BNP: BNP (last 3 results)  Recent Labs  06/13/17 1205 07/09/17 2151  BNP 1,151.0* 1,553.9*    ProBNP (last 3 results) No results for input(s): PROBNP in the last 8760 hours.   D-Dimer No results for input(s): DDIMER in the last 72 hours. Hemoglobin A1C No results for input(s): HGBA1C in the last 72 hours. Fasting Lipid Panel  No results for input(s): CHOL, HDL, LDLCALC, TRIG, CHOLHDL, LDLDIRECT in the last 72 hours. Thyroid Function Tests No results for input(s): TSH, T4TOTAL, T3FREE, THYROIDAB in the last 72 hours.  Invalid input(s): FREET3  Other results:   Imaging   No results found.  Medications:     Scheduled Medications: . Chlorhexidine Gluconate Cloth  6 each Topical Daily  . colchicine  0.3 mg Oral Daily  . darbepoetin (ARANESP) injection - NON-DIALYSIS  60 mcg Subcutaneous Q Wed-1800  . insulin aspart  0-5 Units Subcutaneous QHS  . insulin aspart  0-9 Units Subcutaneous TID WC  . insulin aspart  3 Units Subcutaneous TID WC  . insulin glargine  10 Units Subcutaneous QHS  . sodium chloride flush  10-40 mL Intracatheter Q12H    Infusions: . sodium chloride 250 mL (07/20/17 0700)  . sodium chloride 10 mL/hr at 07/20/17 0608  .  ceFAZolin (ANCEF) IV Stopped (07/21/17 0548)  . heparin 1,650 Units/hr (07/21/17 0517)  . heparin 999 mL (07/17/17 2143)  .  norepinephrine (LEVOPHED) Adult infusion 4 mcg/min (07/21/17 0800)  . dialysis replacement fluid (prismasate) 300 mL/hr at 07/20/17 0813  . dialysis replacement fluid (prismasate) 200 mL/hr at 07/20/17 0735  . dialysate (PRISMASATE) 1,000 mL/hr at 07/20/17 1117    PRN Medications: sodium chloride, acetaminophen, alum & mag hydroxide-simeth, fentaNYL (SUBLIMAZE) injection, heparin, heparin, heparin, HYDROcodone-acetaminophen, magic mouthwash w/lidocaine, ondansetron (ZOFRAN) IV, sodium chloride flush    Patient Profile   [name] is a 61 year old with a history of DMII, SAH 2016, rectal cancer, CKD Stage IV, chronic A fib, S/P AVN ablation, PAF, CAD BMS LAD 2011 BMS LAD 2009, OSA, chronic systolic heart failure, St Jude BiV ICD admitted with AKI and shock with elevated lactate, suspect cardiogenic.   Assessment/Plan   1. Acute on chronic systolic CHF/cardiogenic shock: Last echo in 8/17 with EF 15-20%.  Ischemic cardiomyopathy, has St Jude BiV ICD (despite chronic afib appears to be effectively BiV pacing).  Had recent admission (discharged 06/21/17) requiring milrinone support and diuresis.  Admitted with soft BP (SBP 80s-90s) + elevated lactate.  Suspect mixed septic/cardiogenic shock with MSSA bacteremia.  CVVH begun and ongoing.   - CVVHD stopped yesterday. Minimal urine output - NE down to 4. Co-ox at 54%  - Would not wean NE further at this point.  2. AKI on CKD stage 4: Baseline creatinine around 2.5.  He required brief HD in the past, has RUE fistula. - Started CRRT 8/6. CVVHD stopped 8/15. Cr 1.2-> 1.7 U/O ~315cc/24 hr - Continue to follow for renal recovery  3. Atrial fibrillation: Chronic.  Warfarin on hold, INR 5.23 => 4.83 => 3.50 => 2.27 => 2.10 => 1.7  - Now on heparin. D/w PharmD 4. CAD:  - No CP. Statin on hold for now. Holding ASA.     5. ID: WBC trend 29>24>18.6>14>15>14.2>15.0. > 12.5> 13.8 - Blood CX 2/2 MSSA. Antibiotics narrowed, now on Ancef. Has CRT-D s/p AV nodal  ablation (pacemaker dependent).  - - Per Dr. Daiva Eves with ID note, pt needs line holiday and eventual ICD extraction for total cure.  - Not planning ICD extraction for now due to severe comorbidities. He is device dependent. Once IV abx complete will plan suppressive abx. If recovers further can reconsider device extraction. D/w EO - TEE 07/19/17 with no evidence of endocarditis.   6. H/O Rectal CA with diverting colostomy  7. Type II diabetes: Sliding scale.  - No change.   8.  Liver failure:  - Improved   CRITICAL CARE Performed by: Arvilla Meres  Total critical care time: 35 minutes  Critical care time was exclusive of separately billable procedures and treating other patients.  Critical care was necessary to treat or prevent imminent or life-threatening deterioration.  Critical care was time spent personally by me (independent of midlevel providers or residents) on the following activities: development of treatment plan with patient and/or surrogate as well as nursing, discussions with consultants, evaluation of patient's response to treatment, examination of patient, obtaining history from patient or surrogate, ordering and performing treatments and interventions, ordering and review of laboratory studies, ordering and review of radiographic studies, pulse oximetry and re-evaluation of patient's condition.    Length of Stay: 11  Arvilla Meres, MD  07/21/2017, 8:58 AM  Advanced Heart Failure Team Pager [phone] (M-F; 7a - 4p)  Please contact [facility] for night-coverage after hours (4p -7a ) and weekends on [email or web address]

## 2017-07-21 NOTE — Progress Notes (Addendum)
ANTICOAGULATION/ANTIBIOTIC CONSULT NOTE - Follow Up Consult  Pharmacy Consult for heparin Indication: atrial fibrillation  Labs:  Recent Labs  07/19/17 0451  07/20/17 0425 07/20/17 0428 07/20/17 1207 07/20/17 1544 07/20/17 2013 07/21/17 0408  HGB 7.9*  --   --  7.7*  --   --   --  7.6*  HCT 26.2*  --   --  25.3*  --   --   --  24.2*  PLT 145*  --   --  136*  --   --   --  126*  LABPROT 25.4*  --   --  23.9*  --   --   --  20.5*  INR 2.27  --   --  2.10  --   --   --  1.73  HEPARINUNFRC  --   < > 0.15*  --  0.37  --  0.26* 0.41  CREATININE 1.17  < > 1.10  --   --  1.37*  --  1.79*  < > = values in this interval not displayed.  Assessment: 61yo male subtherapeutic on heparin with initial dosing while Coumadin on hold.  Level last night was 0.26 so heparin rate was increased. Heparin level today is 0.41, which is within goal range. Notes don't indicate any signs or symptoms of bleeding.  CRRT discontinued yesterday. Scr changed from 1.37>1.79 today since discontinuation.   Goal of Therapy:  Heparin level 0.3-0.7 units/ml   Plan:  Continue heparin at 1650 units/hr Check heparin level with AM labs Monitor CBC daily  Continue cefazolin 2 g every 12 hours.  Monitor SCr and urine output off CRRT  Thank you Doylene Canard, PharmD Clinical Pharmacist  Pager: [phone] 07/21/2017,7:21 AM

## 2017-07-21 NOTE — Progress Notes (Signed)
[facility]  Date of Admission:  07/09/2017   Total days of antibiotics 12        Day 12 Cefazolin               ASSESSMENT:  MSSA Bacteremia Septic Shock  The patient was diagnosed with mssa bacteremia on 8/7 per pcr and blood culture. His most recent blood culture collected 8/6 has shown no growth for the past 5 days.   Recent TEE (8/14) did not show any indication for vegetation in the aortic, mitral, pulmonic, or tricuspid valve. TTE (8/5) did not show any indication for vegetations.  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 Cardiothoracic surgery opted not to remove the device as the patient is pacer dependent. However, in order to treat for pacer infection the patient should be on rifampin. Rifampin was stopped 8/8 due to decreased liver function. We believe that there is a substantial utility to restarting rifampin and therefore would like to re-challenge with treatment.   PLAN: 1. Order liver function tests prior to and after starting rifampin 2. Continue cefazolin 3. Patient should get catheter holiday when possible  . Chlorhexidine Gluconate Cloth  6 each Topical Daily  . colchicine  0.3 mg Oral Daily  . darbepoetin (ARANESP) injection - NON-DIALYSIS  60 mcg Subcutaneous Q Wed-1800  . insulin aspart  0-5 Units Subcutaneous QHS  . insulin aspart  0-9 Units Subcutaneous TID WC  . insulin aspart  3 Units Subcutaneous TID WC  . insulin glargine  10 Units Subcutaneous QHS  . sodium chloride flush  10-40 mL Intracatheter Q12H    SUBJECTIVE: [name] was seen laying in his bed this morning with his family at bedside. He was seen doing well and more alert than usual. He denied any nausea/vomiting, sob, chest pain, or any pain in his right hip.   Review of Systems: Review of Systems  as above  No Known Allergies  OBJECTIVE: Vitals:   07/21/17 0630 07/21/17 0700 07/21/17 0800  07/21/17 0921  BP: (!) 108/54 126/90 114/71   Pulse: 78 71 77   Resp: 17 19 (!) 9   Temp:    98.2 F (36.8 C)  TempSrc:    Oral  SpO2: 100% 100% 100%   Weight:      Height:       Body mass index is 26.6 kg/m.  Physical Exam  Constitutional: He appears not lethargic. He does not have a sickly appearance. No distress.  HENT:  Head: Normocephalic and atraumatic.  Cardiovascular: Normal rate, regular rhythm, normal heart sounds and intact distal pulses.   Pulmonary/Chest: Effort normal and breath sounds normal. No respiratory distress. He has no wheezes.  Abdominal: Soft. Bowel sounds are normal. He exhibits no distension. There is no tenderness.  Musculoskeletal: He exhibits no edema or tenderness.  Patient able to lift right leg up  Neurological: He appears not lethargic.  Skin: Rash (bilateral lower extremity on anterior aspect of calf) noted. There is erythema.  Psychiatric: Mood, memory, affect and judgment normal.    Lab Results Lab Results  Component Value Date   WBC 13.8 (H) 07/21/2017   HGB 7.6 (L) 07/21/2017   HCT 24.2 (L) 07/21/2017   MCV 94.5 07/21/2017   PLT 126 (L) 07/21/2017    Lab Results  Component Value Date   CREATININE 1.79 (H) 07/21/2017   BUN 21 (  H) 07/21/2017   NA 131 (L) 07/21/2017   K 4.6 07/21/2017   CL 99 (L) 07/21/2017   CO2 25 07/21/2017    Lab Results  Component Value Date   ALT 7 (L) 07/15/2017   AST 40 07/15/2017   ALKPHOS 147 (H) 07/15/2017   BILITOT 3.6 (H) 07/15/2017     Microbiology: Recent Results (from the past 240 hour(s))  Culture, blood (Routine X 2) w Reflex to ID Panel     Status: None   Collection Time: 07/11/17  4:22 PM  Result Value Ref Range Status   Specimen Description BLOOD BLOOD LEFT HAND  Final   Special Requests   Final    BOTTLES DRAWN AEROBIC ONLY Blood Culture adequate volume   Culture NO GROWTH 5 DAYS  Final   Report Status 07/16/2017 FINAL  Final  Culture, blood (Routine X 2) w Reflex to ID Panel      Status: None   Collection Time: 07/11/17  4:25 PM  Result Value Ref Range Status   Specimen Description BLOOD BLOOD LEFT HAND  Final   Special Requests   Final    BOTTLES DRAWN AEROBIC ONLY Blood Culture adequate volume   Culture NO GROWTH 5 DAYS  Final   Report Status 07/16/2017 FINAL  Final    Lars Mage, [facility] [phone] pager   [phone] cell 07/21/2017, 9:46 AM

## 2017-07-21 NOTE — Progress Notes (Signed)
PULMONARY / CRITICAL CARE MEDICINE   Name: [name] MRN: [id number] DOB: [birth year]    ADMISSION DATE:  07/09/2017   CONSULTATION DATE:  07/10/2017  REFERRING MD:  Sarajane Jews  CHIEF COMPLAINT:  dyspnea  HISTORY OF PRESENT ILLNESS:  61 y.o. male with history of ischemic cardiomyopathy status post ICD placement. Also history of chronic renal failure. Admitted on 8/4 with acute respiratory failure secondary to pulmonary edema. Subsequently found to have a MSSA bacteremia. Patient has had persistent shock requiring vasopressor support and is on renal replacement therapy.   SUBJECTIVE:  No acute change.  Off CVVHD. Denies sob, chest pain.  Remains on low dose levo.  Co-ox 54%   VITAL SIGNS: BP 101/62   Pulse 74   Temp 98.2 F (36.8 C) (Oral)   Resp 17   Ht 5\' 10"  (1.778 m)   Wt 84.1 kg (185 lb 6.5 oz)   SpO2 100%   BMI 26.60 kg/m   HEMODYNAMICS: CVP:  [11 mmHg-20 mmHg] 13 mmHg  VENTILATOR SETTINGS:    INTAKE / OUTPUT: I/O last 3 completed shifts: In: 1701.5 [P.O.:310; I.V.:1091.5; IV Piggyback:300] Out: 2697 [Urine:484; WFUXN:2355]  PHYSICAL EXAMINATION: General:  Chronically ill appearing male, NAD  Integument:  No rash on exposed skin. Warm. Dry. HEENT:  Dry mucous membranes. No scleral icterus. No scleral injection. Cardiovascular:  Regular rate. Paced rhythm on telemetry. No edema Pulmonary: resps even non labored on RA, clear  Abdomen: Nontender. Nondistended. Normal bowel sounds. Neurological: Oriented 4. No meningismus. Grossly nonfocal.  LABS:  BMET  Recent Labs Lab 07/20/17 0425 07/20/17 1544 07/21/17 0408  NA 133* 132* 131*  K 4.6 4.6 4.6  CL 100* 100* 99*  CO2 27 25 25   BUN 13 17 21*  CREATININE 1.10 1.37* 1.79*  GLUCOSE 145* 149* 142*    Electrolytes  Recent Labs Lab 07/19/17 0451  07/20/17 0425 07/20/17 0428 07/20/17 1544 07/21/17 0408  CALCIUM 8.1*  < > 8.1*  --  8.1* 8.1*  MG 2.3  --   --  2.2  --  2.2  PHOS 2.6  < > 2.4*  --   3.1 3.3  < > = values in this interval not displayed.  CBC  Recent Labs Lab 07/19/17 0451 07/20/17 0428 07/21/17 0408  WBC 15.0* 12.5* 13.8*  HGB 7.9* 7.7* 7.6*  HCT 26.2* 25.3* 24.2*  PLT 145* 136* 126*    Coag's  Recent Labs Lab 07/19/17 0451 07/20/17 0428 07/21/17 0408  INR 2.27 2.10 1.73    Sepsis Markers No results for input(s): LATICACIDVEN, PROCALCITON, O2SATVEN in the last 168 hours.  ABG No results for input(s): PHART, PCO2ART, PO2ART in the last 168 hours.  Liver Enzymes  Recent Labs Lab 07/15/17 0350  07/20/17 0425 07/20/17 1544 07/21/17 0408  AST 40  --   --   --   --   ALT 7*  --   --   --   --   ALKPHOS 147*  --   --   --   --   BILITOT 3.6*  --   --   --   --   ALBUMIN 1.7*  < > 2.0* 2.0* 1.9*  < > = values in this interval not displayed.  Cardiac Enzymes No results for input(s): TROPONINI, PROBNP in the last 168 hours.  Glucose  Recent Labs Lab 07/19/17 2129 07/20/17 0757 07/20/17 1130 07/20/17 1602 07/20/17 2153 07/21/17 0842  GLUCAP 164* 144* 122* 131* 197* 122*    Imaging No  results found.   IMAGING/STUDIES: CT ABD/PELVIS W/O 8/5: IMPRESSION: 1. Cirrhosis. Moderate ascites. Anasarca. 2. Diverting colostomy without bowel obstruction. Similar thickened rectum compatible with treatment related changes. 3. Mild cardiomegaly. Moderate RIGHT and small LEFT pleural effusions. TTE 8/5:  LV mildly dilated with mild concentric hypertrophy. EF 15-20% with diffuse hypokinesis and apical akinesis. Not sufficient to allow assessment of diastolic function. LA mildly dilated & RA normal in size. RV normal in size and function. Pacer wire noted within right ventricle. No aortic stenosis with moderate regurgitation. Aortic root normal in size. Mild mitral regurgitation without stenosis. Flattening of the intraventricular septum consistent with right ventricular pressure and volume overload. Mild pulmonic regurgitation without stenosis.  Severe tricuspid regurgitation. No pericardial effusion. Study showed no vegetations but image quality was poor and study was technically difficult. CT R HIP W/O 8/6: IMPRESSION: 1. No acute fracture nor dislocation of either hip. No evidence of AVN. Probable right-sided gluteal tendinopathy. 2. The bones are demineralized in appearance. There is generalized muscle atrophy about the right hip. 3. Anasarca with moderate abdominopelvic ascites. 4. Moderate to large bilateral hydroceles. PORT CXR 8/6:  Previously reviewed by me. Right internal jugular central venous catheter into position. Stable volume loss on the right. No new focal opacity. Implanted device within the left chest wall with leads projecting to the heart. TEE 8/14:  EF 20% with diffuse hypokinesis. Moderate hypokinesis of RV. ICD/pacing wires noted without vegetation. Left atrium dilated without thrombus. Right atrium dilated and pacing wires without vegetation. Mild aortic insufficiency without vegetation. Mild mitral regurgitation without vegetation. Moderate to severe tricuspid regurgitation without vegetation. Pulmonic valve without vegetation. No PFO or ASD. No pericardial effusion.  MICROBIOLOGY: Blood Cultures x2 7/7:  Negative  MRSA PCR 8/5: Negative  Blood Cultures x2 8/4:  2/2 Bottles MSSA Urine Culture 8/4:  Negative  Blood Cultures x2 8/6:  Negative   ANTIBIOTICS: Vancomycin 8/5 Rifampin 8/6 - 8/7 Ancef 8/6 >>>  SIGNIFICANT EVENTS: 08/04 - Admit 08/05 - Started on CVVHD  LINES/TUBES: Foley (out) R IJ TEMP HD CATHETER 8/5 >>> PIV  ASSESSMENT / PLAN:  PULMONARY A: Acute hypoxic respiratory failure: Resolved post procedure. Pulmonary hypertension: Noted on echocardiogram. Likely multifactorial. History of OSA  P:   Continuous pulse oximetry monitoring Nocturnal CPAP therapy Pulmonary hygiene, mobilize as able   CARDIOVASCULAR A:  Ischemic cardio myopathy: Known history of coronary artery  disease. Shock: Combination of sepsis and cardiogenic shock. Status post ICD: Currently with bacteremia. Paroxysmal atrial fibrillation  P:  Continuing to wean Levophed for MAP >65 Continuous telemetry monitoring Vitals per unit protocol Cardiology following & managing  RENAL A:   Hyponatremia: Mild and stable. Acute on chronic renal failure: Unknown prerenal state.  P:   Trending electrolytes every 12 hours Off CVVHD  Renal following Replacing electrolytes as indicated Monitoring urine output   GASTROINTESTINAL A:   History of rectal cancer: Status post colostomy.  Liver cirrhosis  P:   Continuing po diet as tol   HEMATOLOGIC A:   Anemia: Hemoglobin remains stable. Thrombocytopenia:. Platelet count stable. Coagulopathy: Multifactorial from sepsis, antibiotics, Coumadin, and liver cirrhosis. Leukocytosis: Improving. Likely secondary to sepsis.  P:  Continuing to hold Coumadin Trending cell counts daily with CBC Trending INR daily Transfuse for Hgb <7.5 or active bleeding  INFECTIOUS A:   Sepsis MSSA bacteremia: Hemodialysis catheter placed 8/5. ICD in place. Possible source right leg wound. TEE negative.  P:   ID following & appreciate input Continuing antibiotics as above  Plan for line holiday and removal of hardware as coordinated by cardiology and ID  ENDOCRINE A:   Hyperglycemia: Glucose controlled. Multifactorial with steroids.  P:   Lantus, SSI    NEUROLOGIC A:   Arthritis pain: Improving.  P:   Continuing when necessary Norco  ORTHO A: Achilles Tendon Pain Arthritis/Hip Pain  P: Monitor  Prophylaxis:  SCDs & systemic anticoagulation. Diet:  Heart Healthy Carb Modified.  Code Status:  Full Code per previous physician discussions. Disposition:  Continuing to wean vasopressor support while critically ill.  Family Update:  Patient updated during rounds today   Continue in ICU while on pressors. Will ask [facility] to assume care once  stable to tx out of ICU  Nickolas Madrid, NP 07/21/2017  11:13 AM Pager: [phone] or [phone]

## 2017-07-21 NOTE — Progress Notes (Signed)
Patient ID: [name], male   DOB: July 03, 1956, 61 y.o.   MRN: [id number] S:Feels well. Eating OK.   Interval Updates: Off CRRT as of yesterday morning. Minimal UOP, 315 mls yest. Weight 185 today, 182 yest. Remains on Norepi however now on 73mcs/min PT recs CIR.  O:BP 114/71 (BP Location: Left Arm)   Pulse 77   Temp 98.3 F (36.8 C) (Oral)   Resp (!) 9   Ht 5\' 10"  (1.778 m)   Wt 185 lb 6.5 oz (84.1 kg)   SpO2 100%   BMI 26.60 kg/m   Intake/Output Summary (Last 24 hours) at 07/21/17 0823 Last data filed at 07/21/17 0800  Gross per 24 hour  Intake          1201.03 ml  Output              967 ml  Net           234.03 ml   Intake/Output: I/O last 3 completed shifts: In: 1701.5 [P.O.:310; I.V.:1091.5; IV Piggyback:300] Out: 2697 [Urine:484; Other:2213]  Intake/Output this shift:  Total I/O In: 15.3 [I.V.:15.3] Out: 20 [Urine:20] Weight change: 2 lb 6.8 oz (1.1 kg) Gen:NAD, pleasant and conversant. Anisocoria, L pupil larger than right CVS:no rub Resp:cta WUJ:WJXBJY Ext:no edema, RAVF +T/B HD access: R IJ HD cath   Recent Labs Lab 07/15/17 0350  07/18/17 0339 07/18/17 1531 07/19/17 0451 07/19/17 1625 07/20/17 0425 07/20/17 1544 07/21/17 0408  NA 132*  < > 132* 132* 132* 133* 133* 132* 131*  K 4.3  < > 4.5 4.3 4.4 4.6 4.6 4.6 4.6  CL 99*  < > 99* 99* 100* 100* 100* 100* 99*  CO2 25  < > 27 26 25 28 27 25 25   GLUCOSE 159*  < > 172* 175* 172* 211* 145* 149* 142*  BUN 31*  < > 20 19 17 15 13 17  21*  CREATININE 1.36*  < > 1.30* 1.26* 1.17 1.14 1.10 1.37* 1.79*  ALBUMIN 1.7*  < > 2.4* 2.2* 2.2* 2.0* 2.0* 2.0* 1.9*  CALCIUM 8.1*  < > 8.3* 8.0* 8.1* 8.1* 8.1* 8.1* 8.1*  PHOS 2.4*  < > 2.7 2.4* 2.6 2.5 2.4* 3.1 3.3  AST 40  --   --   --   --   --   --   --   --   ALT 7*  --   --   --   --   --   --   --   --   < > = values in this interval not displayed. Liver Function Tests:  Recent Labs Lab 07/15/17 0350  07/20/17 0425 07/20/17 1544 07/21/17 0408  AST  40  --   --   --   --   ALT 7*  --   --   --   --   ALKPHOS 147*  --   --   --   --   BILITOT 3.6*  --   --   --   --   PROT 6.5  --   --   --   --   ALBUMIN 1.7*  < > 2.0* 2.0* 1.9*  < > = values in this interval not displayed. No results for input(s): LIPASE, AMYLASE in the last 168 hours. No results for input(s): AMMONIA in the last 168 hours. CBC:  Recent Labs Lab 07/17/17 0326 07/18/17 0339 07/19/17 0451 07/20/17 0428 07/21/17 0408  WBC 15.4* 14.2* 15.0* 12.5* 13.8*  HGB 7.9*  7.6* 7.9* 7.7* 7.6*  HCT 25.4* 24.5* 26.2* 25.3* 24.2*  MCV 93.7 94.2 94.9 95.8 94.5  PLT 124* 121* 145* 136* 126*  CBG:  Recent Labs Lab 07/19/17 2129 07/20/17 0757 07/20/17 1130 07/20/17 1602 07/20/17 2153  GLUCAP 164* 144* 122* 131* 197*   Iron Studies:  Iron/TIBC/Ferritin/ %Sat    Component Value Date/Time   IRON 28 (L) 07/12/2017 0424   TIBC 231 (L) 07/12/2017 0424   FERRITIN 238 07/12/2017 0424   IRONPCTSAT 12 (L) 07/12/2017 0424   IRONPCTSAT 17 02/24/2017 0915  ' Studies/Results: No results found. . Chlorhexidine Gluconate Cloth  6 each Topical Daily  . colchicine  0.3 mg Oral Daily  . darbepoetin (ARANESP) injection - NON-DIALYSIS  60 mcg Subcutaneous Q Wed-1800  . insulin aspart  0-5 Units Subcutaneous QHS  . insulin aspart  0-9 Units Subcutaneous TID WC  . insulin aspart  3 Units Subcutaneous TID WC  . insulin glargine  10 Units Subcutaneous QHS  . sodium chloride flush  10-40 mL Intracatheter Q12H    A/P  1. AKI/CKD 4- in setting of MSSA bacteremia and ischemic cardiomyopathy/hypotension/volume overload.  Admitted 07/09/17 with respiratory failure and demand ischemia improving with initiation of CVVHD 8/6-8/15. Off RRT today, minimal UOP. Cr 1.37 > 1.79 since discontinuing. Still requiring pressor support however weaning 1. Continue weaning pressors 2. Restart lasix 3. Hopeful for iHD later this week. 2. Acute on chronic CHF/ischemic CMP 3. MSSA bacteremia- unclear  etiology, ?LE wounds. TEE CLEAR OF VEG 8/14. 1. Continue Ancef suppr therapy 4. Anemia- Hb 7.7 today.  1. Transfuse prn 2. Aranesp sq every 2 weeks 3. Iron deficient however caution with repleting given bacteremia 5. Hyponatremia- due to CHF/anasarca improved with CVVHD and UF 6. Cirrhosis- elevated t bili and abnormal lft's off of statin and improving 7. Thrombocytopenia, Chronic- Likely related to cirrhosis. May need to stop heparin, continue to follow. 8. Atrial fib- per cards, currently on Heparin  Noemi Chapel, DO Internal Medicine, PGY2 [facility] [phone]  I have seen and examined this patient and agree with plan and assessment in the above note with renal recommendations/intervention highlighted.  Will resume IV lasix and follow UOP and Scr.  Jomarie Longs A Twala Collings,MD 07/21/2017 12:11 PM

## 2017-07-22 LAB — RENAL FUNCTION PANEL
ALBUMIN: 1.9 g/dL — AB (ref 3.5–5.0)
ALBUMIN: 1.7 g/dL — AB (ref 3.5–5.0)
ANION GAP: 7 (ref 5–15)
Anion gap: 9 (ref 5–15)
BUN: 31 mg/dL — AB (ref 6–20)
BUN: 36 mg/dL — ABNORMAL HIGH (ref 6–20)
CHLORIDE: 97 mmol/L — AB (ref 101–111)
CO2: 23 mmol/L (ref 22–32)
CO2: 23 mmol/L (ref 22–32)
CREATININE: 2.3 mg/dL — AB (ref 0.61–1.24)
Calcium: 8.2 mg/dL — ABNORMAL LOW (ref 8.9–10.3)
Calcium: 8.1 mg/dL — ABNORMAL LOW (ref 8.9–10.3)
Chloride: 96 mmol/L — ABNORMAL LOW (ref 101–111)
Creatinine, Ser: 2.53 mg/dL — ABNORMAL HIGH (ref 0.61–1.24)
GFR calc Af Amer: 33 mL/min — ABNORMAL LOW (ref >60)
GFR calc non Af Amer: 29 mL/min — ABNORMAL LOW (ref >60)
GFR, EST AFRICAN AMERICAN: 30 mL/min — AB (ref >60)
GFR, EST NON AFRICAN AMERICAN: 26 mL/min — AB (ref >60)
GLUCOSE: 149 mg/dL — AB (ref 65–99)
Glucose, Bld: 79 mg/dL (ref 65–99)
PHOSPHORUS: 4.8 mg/dL — AB (ref 2.5–4.6)
PHOSPHORUS: 4.9 mg/dL — AB (ref 2.5–4.6)
POTASSIUM: 4.7 mmol/L (ref 3.5–5.1)
POTASSIUM: 4.4 mmol/L (ref 3.5–5.1)
Sodium: 128 mmol/L — ABNORMAL LOW (ref 135–145)
Sodium: 127 mmol/L — ABNORMAL LOW (ref 135–145)

## 2017-07-22 LAB — GLUCOSE, CAPILLARY
GLUCOSE-CAPILLARY: 156 mg/dL — AB (ref 65–99)
GLUCOSE-CAPILLARY: 142 mg/dL — AB (ref 65–99)
GLUCOSE-CAPILLARY: 88 mg/dL (ref 65–99)
Glucose-Capillary: 90 mg/dL (ref 65–99)

## 2017-07-22 LAB — HEPATIC FUNCTION PANEL
ALK PHOS: 135 U/L — AB (ref 38–126)
ALT: 5 U/L — AB (ref 17–63)
AST: 25 U/L (ref 15–41)
Albumin: 1.9 g/dL — ABNORMAL LOW (ref 3.5–5.0)
BILIRUBIN DIRECT: 0.6 mg/dL — AB (ref 0.1–0.5)
BILIRUBIN INDIRECT: 0.7 mg/dL (ref 0.3–0.9)
Total Bilirubin: 1.3 mg/dL — ABNORMAL HIGH (ref 0.3–1.2)
Total Protein: 6.4 g/dL — ABNORMAL LOW (ref 6.5–8.1)

## 2017-07-22 LAB — CBC
HCT: 23.6 % — ABNORMAL LOW (ref 39.0–52.0)
HEMOGLOBIN: 7.4 g/dL — AB (ref 13.0–17.0)
MCH: 29.1 pg (ref 26.0–34.0)
MCHC: 31.4 g/dL (ref 30.0–36.0)
MCV: 92.9 fL (ref 78.0–100.0)
Platelets: 167 10*3/uL (ref 150–400)
RBC: 2.54 MIL/uL — AB (ref 4.22–5.81)
RDW: 17.3 % — ABNORMAL HIGH (ref 11.5–15.5)
WBC: 13.7 10*3/uL — ABNORMAL HIGH (ref 4.0–10.5)

## 2017-07-22 LAB — PREPARE RBC (CROSSMATCH)

## 2017-07-22 LAB — PROTIME-INR
INR: 1.58
Prothrombin Time: 19.1 seconds — ABNORMAL HIGH (ref 11.4–15.2)

## 2017-07-22 LAB — HEPARIN LEVEL (UNFRACTIONATED)
HEPARIN UNFRACTIONATED: 0.22 [IU]/mL — AB (ref 0.30–0.70)
HEPARIN UNFRACTIONATED: 0.89 [IU]/mL — AB (ref 0.30–0.70)

## 2017-07-22 LAB — HEMOGLOBIN AND HEMATOCRIT, BLOOD
HEMATOCRIT: 27.3 % — AB (ref 39.0–52.0)
HEMOGLOBIN: 8.9 g/dL — AB (ref 13.0–17.0)

## 2017-07-22 LAB — COOXEMETRY PANEL
CARBOXYHEMOGLOBIN: 3 % — AB (ref 0.5–1.5)
Methemoglobin: 1 % (ref 0.0–1.5)
O2 Saturation: 71.4 %
TOTAL HEMOGLOBIN: 5.2 g/dL — AB (ref 12.0–16.0)

## 2017-07-22 LAB — MAGNESIUM: Magnesium: 2.2 mg/dL (ref 1.7–2.4)

## 2017-07-22 MED ORDER — FUROSEMIDE 10 MG/ML IJ SOLN
160.0000 mg | Freq: Three times a day (TID) | INTRAVENOUS | Status: DC
  Administered 2017-07-22 – 2017-07-26 (×11): 160 mg via INTRAVENOUS
  Filled 2017-07-22: qty 10
  Filled 2017-07-22 (×3): qty 16
  Filled 2017-07-22: qty 10
  Filled 2017-07-22 (×2): qty 16
  Filled 2017-07-22: qty 2
  Filled 2017-07-22 (×3): qty 16
  Filled 2017-07-22 (×2): qty 10
  Filled 2017-07-22: qty 16

## 2017-07-22 MED ORDER — SODIUM CHLORIDE 0.9 % IV SOLN
Freq: Once | INTRAVENOUS | Status: AC
  Administered 2017-07-22: 10 mL/h via INTRAVENOUS

## 2017-07-22 MED ORDER — FUROSEMIDE 10 MG/ML IJ SOLN
120.0000 mg | Freq: Three times a day (TID) | INTRAVENOUS | Status: DC
  Filled 2017-07-22: qty 12

## 2017-07-22 MED ORDER — HEPARIN (PORCINE) IN NACL 100-0.45 UNIT/ML-% IJ SOLN
1650.0000 [IU]/h | INTRAMUSCULAR | Status: DC
  Administered 2017-07-22: 1750 [IU]/h via INTRAVENOUS
  Administered 2017-07-23 (×2): 1550 [IU]/h via INTRAVENOUS
  Administered 2017-07-24 – 2017-07-25 (×3): 1650 [IU]/h via INTRAVENOUS
  Filled 2017-07-22 (×6): qty 250

## 2017-07-22 MED ORDER — FUROSEMIDE 10 MG/ML IJ SOLN
120.0000 mg | Freq: Three times a day (TID) | INTRAVENOUS | Status: DC
  Administered 2017-07-22: 120 mg via INTRAVENOUS
  Filled 2017-07-22 (×3): qty 12

## 2017-07-22 NOTE — Progress Notes (Signed)
Occupational Therapy Treatment Patient Details Name: [name] MRN: [id number] DOB: 01/13/1956 Today's Date: 07/22/2017    History of present illness 61 yr old male with extensive PMHx MI, ICM, HFrEF EF15%-20% ,BiVICD, CKD Stage IV, DM, GERD, OSA on CPAP, PAF, presents with weight gain, lower extremity swelling and decreased urine output within the last week.   Work up included AOCsHF/cardiogenic shock, BiV MSSA, demand ischemia.  Pt continues on CRRT.   OT comments  Pt fatigued this pm, but agreeable to working with PT/OT.  He was able to tolerate EOB sitting x ~12 mins with min guard assist, and sidestepped up EOB with mod A +2.   Pt was instructed in  and performed UE and LE exercises - encouraged him to perform these over the weekend.  Pt deferred sitting up in chair or ambulation this date due to fatigue.  Will continue to follow.   Follow Up Recommendations  CIR;Supervision/Assistance - 24 hour    Equipment Recommendations  3 in 1 bedside commode;Tub/shower bench    Recommendations for Other Services Rehab consult    Precautions / Restrictions Precautions Precautions: Fall Precaution Comments: Watch BP       Mobility Bed Mobility Overal bed mobility: Needs Assistance Bed Mobility: Rolling;Sidelying to Sit Rolling: Min assist Sidelying to sit: Mod assist       General bed mobility comments: requires assist to lift trunk.    Transfers Overall transfer level: Needs assistance   Transfers: Sit to/from Stand Sit to Stand: Mod assist;+2 physical assistance         General transfer comment: Pt side stepped up EOB x 3 steps with mod A +2    Balance Overall balance assessment: Needs assistance Sitting-balance support: Feet supported Sitting balance-Leahy Scale: Fair Sitting balance - Comments: maintained EOB sittin ~13 mins    Standing balance support: Bilateral upper extremity supported Standing balance-Leahy Scale: Poor Standing balance comment: requires  mod A +2 and bil. UE support                            ADL either performed or assessed with clinical judgement   ADL Overall ADL's : Needs assistance/impaired     Grooming: Wash/dry hands;Wash/dry face;Minimal assistance;Brushing hair;Sitting                   Toilet Transfer: Moderate assistance;+2 for physical assistance;BSC           Functional mobility during ADLs: Moderate assistance;+2 for physical assistance;+2 for safety/equipment       Vision       Perception     Praxis      Cognition Arousal/Alertness: Awake/alert Behavior During Therapy: WFL for tasks assessed/performed Overall Cognitive Status: Within Functional Limits for tasks assessed                                          Exercises Exercises: Low Level/ICU Low Level/ICU Exercises Shoulder Flexion: AROM;Right;Left;10 reps;Supine;Seated   Shoulder Instructions       General Comments      Pertinent Vitals/ Pain       Pain Assessment: Faces Faces Pain Scale: Hurts whole lot Pain Location: R hip with mobility/weightbearing Pain Descriptors / Indicators: Aching;Grimacing;Guarding Pain Intervention(s): Repositioned  Home Living  Prior Functioning/Environment              Frequency  Min 2X/week        Progress Toward Goals  OT Goals(current goals can now be found in the care plan section)  Progress towards OT goals: Progressing toward goals     Plan Discharge plan remains appropriate    Co-evaluation    PT/OT/SLP Co-Evaluation/Treatment: Yes Reason for Co-Treatment: Complexity of the patient's impairments (multi-system involvement);For patient/therapist safety   OT goals addressed during session: Strengthening/ROM      AM-PAC PT "6 Clicks" Daily Activity     Outcome Measure   Help from another person eating meals?: A Little Help from another person taking care of personal  grooming?: A Little Help from another person toileting, which includes using toliet, bedpan, or urinal?: A Lot Help from another person bathing (including washing, rinsing, drying)?: A Lot Help from another person to put on and taking off regular upper body clothing?: Total Help from another person to put on and taking off regular lower body clothing?: Total 6 Click Score: 12    End of Session    OT Visit Diagnosis: Unsteadiness on feet (R26.81);Muscle weakness (generalized) (M62.81)   Activity Tolerance Patient limited by fatigue   Patient Left in bed;with call bell/phone within reach;with family/visitor present   Nurse Communication Mobility status        Time: 3151-7616 OT Time Calculation (min): 23 min  Charges: OT General Charges $OT Visit: 1 Procedure OT Treatments $Therapeutic Activity: 8-22 mins  [facility], OTR/L [phone]    Lucille Passy M 07/22/2017, 3:38 PM

## 2017-07-22 NOTE — Progress Notes (Signed)
PT Cancellation Note  Patient Details Name: [name] MRN: [id number] DOB: 25-Jun-1956   Cancelled Treatment:    Reason Eval/Treat Not Completed: Medical issues which prohibited therapy (Hgb too low, nurse wanted pt to wait to get 2 units blood.) Will check back as able.     Godfrey Pick Lizvet Chunn 07/22/2017, 10:38 AM [facility] [phone] (pager)

## 2017-07-22 NOTE — Progress Notes (Signed)
[facility]  Date of Admission:  07/09/2017   Total days of antibiotics 13        Day 13 Cefazolin        Restart Rifampin 07/22/17 ASSESSMENT:  MSSA Bacteremia Septic Shock  The patient was diagnosed with mssa bacteremia on 8/7 per pcr and blood culture. His most recent blood culture collected 8/6 has shown no growth for the past 5 days. TTE and TEE both did not show any vegetations in any of the valves. Since the patient has a BiV ICD  which is presumed to be infected in the setting of mssa bacteremia we opted to restart rifampin and monitor closely for liver enzymes. Hepatic function panel results (8/17) are fairly normal (ast=25, alt=5, tbili=1.3, dbili=0.6), therefore rifampin can be started and we will monitor for any hepatotoxicity.  PLAN: 1. Start rifampin 2. Continue cefazolin 3. Patient should get catheter holiday when possible    . Chlorhexidine Gluconate Cloth  6 each Topical Daily  . colchicine  0.3 mg Oral Daily  . darbepoetin (ARANESP) injection - NON-DIALYSIS  60 mcg Subcutaneous Q Wed-1800  . furosemide  80 mg Intravenous BID  . insulin aspart  0-5 Units Subcutaneous QHS  . insulin aspart  0-9 Units Subcutaneous TID WC  . insulin aspart  3 Units Subcutaneous TID WC  . insulin glargine  10 Units Subcutaneous QHS  . sodium chloride flush  10-40 mL Intracatheter Q12H    SUBJECTIVE: [name] was seen laying in his bed this morning. He was doing well and denied any sob, chest pain, nausea/vomiting. He appears more alert today and less lethargic.   Review of Systems: ROS as above  No Known Allergies  OBJECTIVE: Vitals:   07/22/17 0200 07/22/17 0300 07/22/17 0401 07/22/17 0500  BP: (!) 119/56 (!) 93/44  (!) 113/54  Pulse: 74 72  75  Resp: 10 11  15   Temp:   99.1 F (37.3 C)   TempSrc:   Oral   SpO2: 100% 100%  100%  Weight:    192 lb 14.4 oz (87.5 kg)  Height:       Body mass index is 27.68 kg/m.  Physical Exam    Constitutional: He is well-developed, well-nourished, and in no distress. He appears not lethargic. He does not have a sickly appearance. No distress.  HENT:  Head: Normocephalic and atraumatic.  Cardiovascular: Normal rate, regular rhythm and normal heart sounds.   No murmur heard. Pulmonary/Chest: Effort normal and breath sounds normal. No respiratory distress. He has no wheezes.  Abdominal: Soft. Bowel sounds are normal. He exhibits no distension. There is no tenderness.  Neurological: He appears not lethargic.  Skin:  Skin breakdown on bilateral lower extremities that is currently bandaged  Psychiatric: Mood, memory, affect and judgment normal.    Lab Results Lab Results  Component Value Date   WBC 13.7 (H) 07/22/2017   HGB 7.4 (L) 07/22/2017   HCT 23.6 (L) 07/22/2017   MCV 92.9 07/22/2017   PLT 167 07/22/2017    Lab Results  Component Value Date   CREATININE 2.30 (H) 07/22/2017   BUN 31 (H) 07/22/2017   NA 128 (L) 07/22/2017   K 4.7 07/22/2017   CL 96 (L) 07/22/2017   CO2 23 07/22/2017    Lab Results  Component Value Date   ALT 7 (L) 07/15/2017   AST 40 07/15/2017   ALKPHOS 147 (H) 07/15/2017   BILITOT 3.6 (H)  07/15/2017     Microbiology: No results found for this or any previous visit (from the past 240 hour(s)).  [name], [facility] [place] [phone] pager   [phone] cell 07/22/2017, 8:10 AM

## 2017-07-22 NOTE — Progress Notes (Addendum)
Critical care team paged regarding morning lab results: HH 7.6/ 23.6 and total hemoglobin dropped from 11  To 5.2.

## 2017-07-22 NOTE — Progress Notes (Signed)
Physical Therapy Treatment Patient Details Name: [name] MRN: [id number] DOB: Oct 03, 1956 Today's Date: 07/22/2017    History of Present Illness 61 yr old male with extensive PMHx MI, ICM, HFrEF EF15%-20% ,BiVICD, CKD Stage IV, DM, GERD, OSA on CPAP, PAF, presents with weight gain, lower extremity swelling and decreased urine output within the last week.   Work up included AOCsHF/cardiogenic shock, BiV MSSA, demand ischemia.  Pt continues on CRRT.    PT Comments    Pt admitted with above diagnosis. Pt currently with functional limitations due to balance, endurance and strength deficits.  Pt is very weak after Hgb was low but was willing to try.  Mod assist overall for mobility.  Able to sit EOB with min guard assist for 13 min. Performed UE and LE exercises.  Right LE weaker than left LE.  At end of treatment BP decr to 87/74 but pt asymptomatic.   Continue to progress as able.  Pt will benefit from skilled PT to increase their independence and safety with mobility to allow discharge to the venue listed below.     Follow Up Recommendations  CIR;Supervision/Assistance - 24 hour     Equipment Recommendations  Other (comment) (TBA)    Recommendations for Other Services Rehab consult     Precautions / Restrictions Precautions Precautions: Fall Precaution Comments: Watch BP Restrictions Weight Bearing Restrictions: No    Mobility  Bed Mobility Overal bed mobility: Needs Assistance Bed Mobility: Rolling;Sidelying to Sit Rolling: Min assist Sidelying to sit: Mod assist       General bed mobility comments: requires assist to lift trunk.    Transfers Overall transfer level: Needs assistance Equipment used: 2 person hand held assist Transfers: Sit to/from Stand Sit to Stand: Mod assist;+2 physical assistance Stand pivot transfers: Min assist;+2 safety/equipment       General transfer comment: Pt side stepped up EOB x 3 steps with mod A  +2  Ambulation/Gait Ambulation/Gait assistance: Mod assist;+2 safety/equipment   Assistive device:  (EVA walker) Gait Pattern/deviations: Step-through pattern;Decreased step length - right;Decreased step length - left;Drifts right/left;Trunk flexed;Narrow base of support     General Gait Details: cues for posture; pt with increased trunk flexion with fatigue; assist for balance and management of AD; VSS   Stairs            Wheelchair Mobility    Modified Rankin (Stroke Patients Only)       Balance Overall balance assessment: Needs assistance Sitting-balance support: Feet supported Sitting balance-Leahy Scale: Fair Sitting balance - Comments: maintained EOB sitting ~13 mins  Postural control: Posterior lean Standing balance support: Bilateral upper extremity supported Standing balance-Leahy Scale: Poor Standing balance comment: requires mod A +2 and bil. UE support                             Cognition Arousal/Alertness: Awake/alert Behavior During Therapy: WFL for tasks assessed/performed Overall Cognitive Status: Within Functional Limits for tasks assessed Area of Impairment: Following commands;Problem solving;Awareness                       Following Commands: Follows one step commands consistently   Awareness: Emergent Problem Solving: Slow processing General Comments: WFL for basic info       Exercises Low Level/ICU Exercises Ankle Circles/Pumps: AROM;Both;10 reps;Supine Quad Sets: AROM;Both;5 reps;Supine Heel Slides: AAROM;Both;10 reps;Supine Shoulder Flexion: AROM;Right;Left;10 reps;Supine;Seated Other Exercises Other Exercises: LAQ sitting EOB x 5  General Comments General comments (skin integrity, edema, etc.): VSS      Pertinent Vitals/Pain Pain Assessment: Faces Faces Pain Scale: Hurts whole lot Pain Location: R hip with mobility/weightbearing Pain Descriptors / Indicators: Aching;Grimacing;Guarding Pain  Intervention(s): Limited activity within patient's tolerance;Monitored during session;Repositioned    Home Living                      Prior Function            PT Goals (current goals can now be found in the care plan section) Acute Rehab PT Goals Patient Stated Goal: be able to deer hunt and fish PT Goal Formulation: With patient Time For Goal Achievement: 07/29/17 Potential to Achieve Goals: Good Progress towards PT goals: Progressing toward goals    Frequency    Min 3X/week      PT Plan Current plan remains appropriate    Co-evaluation PT/OT/SLP Co-Evaluation/Treatment: Yes Reason for Co-Treatment: Complexity of the patient's impairments (multi-system involvement) PT goals addressed during session: Mobility/safety with mobility OT goals addressed during session: Strengthening/ROM      AM-PAC PT "6 Clicks" Daily Activity  Outcome Measure  Difficulty turning over in bed (including adjusting bedclothes, sheets and blankets)?: Unable Difficulty moving from lying on back to sitting on the side of the bed? : Unable Difficulty sitting down on and standing up from a chair with arms (e.g., wheelchair, bedside commode, etc,.)?: Unable Help needed moving to and from a bed to chair (including a wheelchair)?: A Lot Help needed walking in hospital room?: A Lot Help needed climbing 3-5 steps with a railing? : A Lot 6 Click Score: 9    End of Session Equipment Utilized During Treatment: Gait belt Activity Tolerance: Patient limited by fatigue Patient left: with call bell/phone within reach;with family/visitor present;in bed Nurse Communication: Mobility status PT Visit Diagnosis: Other abnormalities of gait and mobility (R26.89);Muscle weakness (generalized) (M62.81);Pain Pain - Right/Left: Right Pain - part of body: Hip     Time: 4403-4742 PT Time Calculation (min) (ACUTE ONLY): 23 min  Charges:  $Therapeutic Activity: 8-22 mins                    G Codes:        Nikiesha Milford,PT Acute Rehabilitation [phone] (pager)    Berline Lopes 07/22/2017, 3:46 PM

## 2017-07-22 NOTE — Progress Notes (Signed)
ANTICOAGULATION/ANTIBIOTIC CONSULT NOTE - Follow Up Consult  Pharmacy Consult for heparin Indication: atrial fibrillation  Labs:  Recent Labs  07/20/17 0428  07/21/17 0408 07/21/17 1508 07/22/17 0500 07/22/17 1700 07/22/17 1800  HGB 7.7*  --  7.6*  --  7.4* 8.9*  --   HCT 25.3*  --  24.2*  --  23.6* 27.3*  --   PLT 136*  --  126*  --  167  --   --   LABPROT 23.9*  --  20.5*  --  19.1*  --   --   INR 2.10  --  1.73  --  1.58  --   --   HEPARINUNFRC  --   < > 0.41  --  0.22*  --  0.89*  CREATININE  --   < > 1.79* 2.02* 2.30*  --   --   < > = values in this interval not displayed.  Assessment: 60yo male subtherapeutic on heparin with initial dosing while Coumadin on hold. Heparin dosing rate is 90.7 kg.   Level this morning was subtherapeutic at 0.22. Rate was increased by 100 units/hr to 1750 units/hr. H/H and platelets remained stable. Level came back tonight at 0.89, which is higher than goal range. Notes don't indicate any signs or symptoms of bleeding. IV heparin drip is running through peripheral line.   CRRT discontinued yesterday. Scr changed from 1.79>2.3 today since discontinuation.   Goal of Therapy:  Heparin level 0.3-0.7 units/ml   Plan:  Decrease heparin at 1550 units/hr Check heparin level in 8 hours Monitor CBC daily  Thank you Doylene Canard, PharmD Clinical Pharmacist  Pager: [phone] 07/22/2017,7:29 PM

## 2017-07-22 NOTE — Progress Notes (Addendum)
Patient ID: SERAFINO BURCIAGA, male   DOB: Feb 10, 1956, 61 y.o.   MRN: [id number]     Advanced Heart Failure Rounding Note  Primary Cardiologist: Juniel Groene  Subjective:    Admitted with staph bacteremia with mixed septic/cardiogenic shock.   TEE 07/19/17 with LVEF 20%. No evidence of endocarditis or vegetation on valves/ICD  Norepi up to 8. Lightheadedness with walking. Denies SOB currently. CVP back up to 22. Weight up 10 lbs since CVVHD stopped.   CVVD stopped 07/20/17.  Creatinine 1.3 -> 1.7 -> 2.3. NE down to 4. Coox 71.4% this am.   Objective:   Weight Range: 192 lb 14.4 oz (87.5 kg) Body mass index is 27.68 kg/m.   Vital Signs:   Temp:  [97.9 F (36.6 C)-99.1 F (37.3 C)] 98.4 F (36.9 C) (08/17 0830) Pulse Rate:  [70-77] 73 (08/17 0830) Resp:  [8-20] 20 (08/17 0830) BP: (75-119)/(40-71) 109/54 (08/17 0830) SpO2:  [92 %-100 %] 100 % (08/17 0830) Weight:  [192 lb 14.4 oz (87.5 kg)] 192 lb 14.4 oz (87.5 kg) (08/17 0500) Last BM Date: 07/21/17  Weight change: Filed Weights   07/20/17 0500 07/21/17 0400 07/22/17 0500  Weight: 182 lb 15.7 oz (83 kg) 185 lb 6.5 oz (84.1 kg) 192 lb 14.4 oz (87.5 kg)    Intake/Output:   Intake/Output Summary (Last 24 hours) at 07/22/17 0912 Last data filed at 07/22/17 0800  Gross per 24 hour  Intake            752.6 ml  Output              585 ml  Net            167.6 ml      Physical Exam   CVP 22  General: Lying in bed. Chronically ill appearing. No resp difficulty. HEENT: Normal Neck: Supple. JVP up to jaw. Carotids 2+ bilat; no bruits. No thyromegaly or nodule noted. Cor: PMI nondisplaced. RRR, 2/6 SEM RUBS and LUSB.  Lungs: Clear anteriorly  Abdomen: Soft, non-tender, non-distended, no HSM. No bruits or masses. +BS  Extremities: No cyanosis, clubbing, or rash. Wounds on LE wrapped. 1+ edema to knees.   Neuro: Alert & orientedx3, cranial nerves grossly intact. moves all 4 extremities w/o difficulty. Affect pleasant    Telemetry   Personally reviewed, BiV pacing 70s, underlying Afib.   Labs    CBC  Recent Labs  07/21/17 0408 07/22/17 0500  WBC 13.8* 13.7*  HGB 7.6* 7.4*  HCT 24.2* 23.6*  MCV 94.5 92.9  PLT 126* 785   Basic Metabolic Panel  Recent Labs  07/21/17 0408 07/21/17 1508 07/22/17 0500  NA 131* 130* 128*  K 4.6 4.5 4.7  CL 99* 98* 96*  CO2 25 25 23   GLUCOSE 142* 103* 149*  BUN 21* 27* 31*  CREATININE 1.79* 2.02* 2.30*  CALCIUM 8.1* 8.1* 8.2*  MG 2.2  --  2.2  PHOS 3.3 4.0 4.8*   Liver Function Tests  Recent Labs  07/22/17 0500 07/22/17 0803  AST  --  25  ALT  --  5*  ALKPHOS  --  135*  BILITOT  --  1.3*  PROT  --  6.4*  ALBUMIN 1.9* 1.9*   No results for input(s): LIPASE, AMYLASE in the last 72 hours. Cardiac Enzymes No results for input(s): CKTOTAL, CKMB, CKMBINDEX, TROPONINI in the last 72 hours.  BNP: BNP (last 3 results)  Recent Labs  06/13/17 1205 07/09/17 2151  BNP 1,151.0* 1,553.9*  ProBNP (last 3 results) No results for input(s): PROBNP in the last 8760 hours.   D-Dimer No results for input(s): DDIMER in the last 72 hours. Hemoglobin A1C No results for input(s): HGBA1C in the last 72 hours. Fasting Lipid Panel No results for input(s): CHOL, HDL, LDLCALC, TRIG, CHOLHDL, LDLDIRECT in the last 72 hours. Thyroid Function Tests No results for input(s): TSH, T4TOTAL, T3FREE, THYROIDAB in the last 72 hours.  Invalid input(s): FREET3  Other results:   Imaging   No results found.  Medications:     Scheduled Medications: . Chlorhexidine Gluconate Cloth  6 each Topical Daily  . colchicine  0.3 mg Oral Daily  . darbepoetin (ARANESP) injection - NON-DIALYSIS  60 mcg Subcutaneous Q Wed-1800  . insulin aspart  0-5 Units Subcutaneous QHS  . insulin aspart  0-9 Units Subcutaneous TID WC  . insulin aspart  3 Units Subcutaneous TID WC  . insulin glargine  10 Units Subcutaneous QHS  . sodium chloride flush  10-40 mL Intracatheter Q12H     Infusions: . sodium chloride 250 mL (07/20/17 0700)  . sodium chloride 20 mL/hr at 07/21/17 1234  .  ceFAZolin (ANCEF) IV Stopped (07/22/17 0557)  . furosemide    . heparin 1,650 Units/hr (07/21/17 2111)  . heparin 999 mL (07/17/17 2143)  . norepinephrine (LEVOPHED) Adult infusion 9 mcg/min (07/22/17 0800)  . dialysis replacement fluid (prismasate) 300 mL/hr at 07/20/17 0813  . dialysis replacement fluid (prismasate) 200 mL/hr at 07/20/17 0735  . dialysate (PRISMASATE) 1,000 mL/hr at 07/20/17 1117    PRN Medications: sodium chloride, acetaminophen, alum & mag hydroxide-simeth, fentaNYL (SUBLIMAZE) injection, heparin, heparin, heparin, HYDROcodone-acetaminophen, magic mouthwash w/lidocaine, ondansetron (ZOFRAN) IV, sodium chloride flush    Patient Profile   Mr Blethen is a 61 year old with a history of DMII, [place] 2016, rectal cancer, CKD Stage IV, chronic A fib, S/P AVN ablation, PAF, CAD BMS LAD 2011 BMS LAD 2009, OSA, chronic systolic heart failure, St Jude BiV ICD admitted with AKI and shock with elevated lactate, suspect cardiogenic.   Assessment/Plan   1. Acute on chronic systolic CHF/cardiogenic shock: Last echo in 8/17 with EF 15-20%.  Ischemic cardiomyopathy, has St Jude BiV ICD (despite chronic afib appears to be effectively BiV pacing).  Had recent admission (discharged 06/21/17) requiring milrinone support and diuresis.  Admitted with soft BP (SBP 80s-90s) + elevated lactate.  Suspect mixed septic/cardiogenic shock with MSSA bacteremia.  CVVH begun and ongoing.   - CVVHD stopped 07/20/17. Creatinine rising and minimal urine output noted.  - NE back up to 8. Coox at 71%.    - CVP back up to 22. Volume re-accumulating.  2. AKI on CKD stage 4:  - Baseline creatinine around 2.5.  He required brief HD in the past, has RUE fistula. - Started CRRT 8/6. CVVHD stopped 8/15. Cr 1.2-> 1.7 -> 2.3. UO~ 480 cc 24/hr.  - Per renal note this am recommend resuming CVVHD vs IHD. Up 10 lbs in  2 days.  3. Atrial fibrillation: Chronic.  Warfarin on hold, INR 5.23 => 4.83 => 3.50 => 2.27 => 2.10 => 1.7 => 1.58 - Remains on heparin. D/w PharmD.  4. CAD:  - No CP. Statin on hold for now. Holding ASA.     5. ID: WBC trend 29>24>18.6>14>15>14.2>15.0. > 12.5> 13.8 > 13.7 - Blood CX 2/2 MSSA. Antibiotics narrowed, now on Ancef. 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  - Per Dr. Alie Medal with ID note, pt needs  line holiday and eventual ICD extraction for total cure.  - Not planning ICD extraction for now due to severe comorbidities. He is device dependent. Once IV abx complete will plan suppressive abx. If recovers further can reconsider device extraction. D/w EP - TEE 07/19/17 with no evidence of endocarditis.   - No change.  6. H/O Rectal CA with diverting colostomy  7. Type II diabetes: Sliding scale.  - No change.   8. Liver failure:  - Stable.  9. Anemia of chronic disease. - Hgb 7.4 this am. Type and Screen. Will likely need transfusion. Will discuss with MD and renal.  - Aranesp ordered.  Tenuous. Nephrology recommending resumption of CVVHD vs IHD. Requiring more Norepi. Prognosis guarded.   Length of Stay: [address]  Annamaria Helling  07/22/2017, 9:12 AM  Advanced Heart Failure Team Pager [phone] (M-F; 7a - 4p)  Please contact [facility] for night-coverage after hours (4p -7a ) and weekends on [email or web address]  Agree with above.  Remains very tenuous. Increasing pressor requirements with norepinephrine. Off CVVHD x 2 days. Urine output poor. Weight and JVD up. Failed 120mg  IV lasix challenge. Now getting 160 IV tid. Hgb low  On exam Pale. Weak. Lying in bed JVP to jaw. RIJ trialysis Cor RRR Lungs: crackles at bases Ab: soft NT good BS. Colostomy site Ext: 2+ edema with LE wounds  Urine output remains poor off CVVHD. Now challenging with high-dose lasix. Will likely need to resume dialysis. Pressure remains soft and requiring NE. Doubt he will  tolerate iHD well and may be moving closer to Palliative Care. Will transfuse 2u RBCs today. D/w Dr. Marval Regal. Continue with abx. ID has seen, No plan for device extraction at this point. Rifampin restarted - will affect warfarin dosing if /when we restart. Remains on heparin for now.   CRITICAL CARE Performed by: Glori Bickers  Total critical care time: 35 minutes  Critical care time was exclusive of separately billable procedures and treating other patients.  Critical care was necessary to treat or prevent imminent or life-threatening deterioration.  Critical care was time spent personally by me (independent of midlevel providers or residents) on the following activities: development of treatment plan with patient and/or surrogate as well as nursing, discussions with consultants, evaluation of patient's response to treatment, examination of patient, obtaining history from patient or surrogate, ordering and performing treatments and interventions, ordering and review of laboratory studies, ordering and review of radiographic studies, pulse oximetry and re-evaluation of patient's condition.   Glori Bickers, MD  7:28 PM

## 2017-07-22 NOTE — Progress Notes (Addendum)
Patient ID: [name], male   DOB: November 11, 1956, 61 y.o.   MRN: [id number] S:Feels well. Eating OK.   Interval Updates: Off CRRT 8/15. 480 mLs UOP recorded yest on IV Lasix 80mg  BID. Weight continues to rise since cessation of CRRT.  He is requiring 2x as much norepi as yesterday, on 9.4   Cr rising to 2.3 from 1.79 yesterday. Filed Weights   07/20/17 0500 07/21/17 0400 07/22/17 0500  Weight: 182 lb 15.7 oz (83 kg) 185 lb 6.5 oz (84.1 kg) 192 lb 14.4 oz (87.5 kg)   O:BP (!) 113/54   Pulse 75   Temp 99.1 F (37.3 C) (Oral)   Resp 15   Ht 5\' 10"  (1.778 m)   Wt 192 lb 14.4 oz (87.5 kg)   SpO2 100%   BMI 27.68 kg/m   Intake/Output Summary (Last 24 hours) at 07/22/17 0817 Last data filed at 07/22/17 0600  Gross per 24 hour  Intake            749.9 ml  Output              460 ml  Net            289.9 ml   Intake/Output: I/O last 3 completed shifts: In: 1299.3 [I.V.:1099.3; IV Piggyback:200] Out: 695 [Urine:695]  Intake/Output this shift:  No intake/output data recorded. Weight change: 7 lb 7.9 oz (3.4 kg) Gen:NAD, pleasant and conversant. Looks tired CVS:no rub Resp:cta GMW:NUUVOZ Ext: +dependent edema. RAVF +T/B HD access: R IJ HD cath  Recent Labs Lab 07/19/17 0451 07/19/17 1625 07/20/17 0425 07/20/17 1544 07/21/17 0408 07/21/17 1508 07/22/17 0500  NA 132* 133* 133* 132* 131* 130* 128*  K 4.4 4.6 4.6 4.6 4.6 4.5 4.7  CL 100* 100* 100* 100* 99* 98* 96*  CO2 25 28 27 25 25 25 23   GLUCOSE 172* 211* 145* 149* 142* 103* 149*  BUN 17 15 13 17  21* 27* 31*  CREATININE 1.17 1.14 1.10 1.37* 1.79* 2.02* 2.30*  ALBUMIN 2.2* 2.0* 2.0* 2.0* 1.9* 1.8* 1.9*  CALCIUM 8.1* 8.1* 8.1* 8.1* 8.1* 8.1* 8.2*  PHOS 2.6 2.5 2.4* 3.1 3.3 4.0 4.8*   Liver Function Tests:  Recent Labs Lab 07/21/17 0408 07/21/17 1508 07/22/17 0500  ALBUMIN 1.9* 1.8* 1.9*   No results for input(s): LIPASE, AMYLASE in the last 168 hours. No results for input(s): AMMONIA in the last 168  hours. CBC:  Recent Labs Lab 07/18/17 0339 07/19/17 0451 07/20/17 0428 07/21/17 0408 07/22/17 0500  WBC 14.2* 15.0* 12.5* 13.8* 13.7*  HGB 7.6* 7.9* 7.7* 7.6* 7.4*  HCT 24.5* 26.2* 25.3* 24.2* 23.6*  MCV 94.2 94.9 95.8 94.5 92.9  PLT 121* 145* 136* 126* 167  CBG:  Recent Labs Lab 07/20/17 2153 07/21/17 0842 07/21/17 1232 07/21/17 1542 07/21/17 2150  GLUCAP 197* 122* 90 100* 154*   Iron Studies:  Iron/TIBC/Ferritin/ %Sat    Component Value Date/Time   IRON 28 (L) 07/12/2017 0424   TIBC 231 (L) 07/12/2017 0424   FERRITIN 238 07/12/2017 0424   IRONPCTSAT 12 (L) 07/12/2017 0424   IRONPCTSAT 17 02/24/2017 0915  ' Studies/Results: No results found. . Chlorhexidine Gluconate Cloth  6 each Topical Daily  . colchicine  0.3 mg Oral Daily  . darbepoetin (ARANESP) injection - NON-DIALYSIS  60 mcg Subcutaneous Q Wed-1800  . furosemide  80 mg Intravenous BID  . insulin aspart  0-5 Units Subcutaneous QHS  . insulin aspart  0-9 Units Subcutaneous TID WC  .  insulin aspart  3 Units Subcutaneous TID WC  . insulin glargine  10 Units Subcutaneous QHS  . sodium chloride flush  10-40 mL Intracatheter Q12H    A/P  1. AKI/CKD 4- in setting of MSSA bacteremia and ischemic cardiomyopathy/hypotension/volume overload.  Admitted 07/09/17 with respiratory failure and demand ischemia improving with initiation of CVVHD 8/6-8/15. Still with minimal UOP despite Iv lasix 80mg  BID. Cr 1.37 > 1.79 > 2.3 since cessation. He is requiring much more Norepi than yesterday. He is up 10lbs in 2 days.  1. Increase lasix to 120mg  BID 2. If failure, increase to 160mg  BID 3. If failure of the above, will need to resume CRRT 2. Acute on chronic CHF/ischemic CMP 1. Volume management as above 3. MSSA bacteremia- unclear etiology, ?LE wounds.  1. TEE CLEAR OF VEG 8/14. 2. Continue Ancef suppr therapy 4. Anemia- Hb 7.4 today.  1. Transfuse prn 2. Aranesp sq every 2 weeks 3. Iron deficient however caution  with repleting given bacteremia 5. Hyponatremia- due to CHF/anasarca improved with CVVHD and UF 6. Cirrhosis- elevated t bili and abnormal lft's, improved off statin 7. Thrombocytopenia, Chronic- Likely related to cirrhosis. May need to stop heparin, continue to follow. 1. Plts 167 today. 8. Atrial fib- per cards, currently on Heparin  Noemi Chapel, DO Internal Medicine, PGY2 [facility] [phone]  I have seen and examined this patient and agree with plan and assessment in the above note with renal recommendations/intervention highlighted.  Pt has gained 10lbs since off of CVVHD and has had increased pressor requirements.  Will increase IV Lasix but will likely need to resume CVVHD in am if no significant improvement in UOP.  No response to 120mg  IV lasix so will increase to 160mg  tid and follow.  Jomarie Longs A Hersel Mcmeen,MD 07/22/2017 4:02 PM

## 2017-07-22 NOTE — Progress Notes (Signed)
PULMONARY / CRITICAL CARE MEDICINE   Name: [name] MRN: [id number] DOB: 01-May-1956    ADMISSION DATE:  07/09/2017   CONSULTATION DATE:  07/10/2017  REFERRING MD:  Sarajane Jews  CHIEF COMPLAINT:  dyspnea  HISTORY OF PRESENT ILLNESS:  61 y.o. male with history of ischemic cardiomyopathy status post ICD placement. Also history of chronic renal failure. Admitted on 8/4 with acute respiratory failure secondary to pulmonary edema. Subsequently found to have a MSSA bacteremia. Patient has had persistent shock requiring vasopressor support and is on renal replacement therapy.   SUBJECTIVE:  No abdominal pain or nausea. No dyspnea or cough. Vasopressor requirement slowly increasing.   REVIEW OF SYSTEMS:  No chest pain or pressure. No subjective fever or chills.  VITAL SIGNS: BP 99/70   Pulse 70   Temp 98.4 F (36.9 C) (Oral)   Resp 16   Ht 5\' 10"  (1.778 m)   Wt 192 lb 14.4 oz (87.5 kg)   SpO2 98%   BMI 27.68 kg/m   HEMODYNAMICS: CVP:  [11 mmHg-18 mmHg] 18 mmHg  VENTILATOR SETTINGS:    INTAKE / OUTPUT: I/O last 3 completed shifts: In: 1299.3 [I.V.:1099.3; IV Piggyback:200] Out: 695 [Urine:695]  PHYSICAL EXAMINATION: General:  Comfortable. Sleepy until awoken. No distress. Integument:  Warm. Dry. No rash appreciated. HEENT:  No scleral injection. No scleral icterus. Cardiovascular:  Unable to appreciate JVD. Regular rate. Paced rhythm. Pulmonary: Normal work of breathing on room air. Good aeration bilaterally. Abdomen: Soft. Nontender. Normoactive bowel sounds.. Neurological: Grossly nonfocal. No meningismus. Cranial nerves grossly intact.  LABS:  BMET  Recent Labs Lab 07/21/17 0408 07/21/17 1508 07/22/17 0500  NA 131* 130* 128*  K 4.6 4.5 4.7  CL 99* 98* 96*  CO2 25 25 23   BUN 21* 27* 31*  CREATININE 1.79* 2.02* 2.30*  GLUCOSE 142* 103* 149*    Electrolytes  Recent Labs Lab 07/20/17 0428  07/21/17 0408 07/21/17 1508 07/22/17 0500  CALCIUM  --   < >  8.1* 8.1* 8.2*  MG 2.2  --  2.2  --  2.2  PHOS  --   < > 3.3 4.0 4.8*  < > = values in this interval not displayed.  CBC  Recent Labs Lab 07/20/17 0428 07/21/17 0408 07/22/17 0500  WBC 12.5* 13.8* 13.7*  HGB 7.7* 7.6* 7.4*  HCT 25.3* 24.2* 23.6*  PLT 136* 126* 167    Coag's  Recent Labs Lab 07/20/17 0428 07/21/17 0408 07/22/17 0500  INR 2.10 1.73 1.58    Sepsis Markers No results for input(s): LATICACIDVEN, PROCALCITON, O2SATVEN in the last 168 hours.  ABG No results for input(s): PHART, PCO2ART, PO2ART in the last 168 hours.  Liver Enzymes  Recent Labs Lab 07/21/17 1508 07/22/17 0500 07/22/17 0803  AST  --   --  25  ALT  --   --  5*  ALKPHOS  --   --  135*  BILITOT  --   --  1.3*  ALBUMIN 1.8* 1.9* 1.9*    Cardiac Enzymes No results for input(s): TROPONINI, PROBNP in the last 168 hours.  Glucose  Recent Labs Lab 07/20/17 2153 07/21/17 0842 07/21/17 1232 07/21/17 1542 07/21/17 2150 07/22/17 0830  GLUCAP 197* 122* 90 100* 154* 156*    Imaging No results found.   IMAGING/STUDIES: CT ABD/PELVIS W/O 8/5: IMPRESSION: 1. Cirrhosis. Moderate ascites. Anasarca. 2. Diverting colostomy without bowel obstruction. Similar thickened rectum compatible with treatment related changes. 3. Mild cardiomegaly. Moderate RIGHT and small LEFT pleural effusions. TTE  8/5:  LV mildly dilated with mild concentric hypertrophy. EF 15-20% with diffuse hypokinesis and apical akinesis. Not sufficient to allow assessment of diastolic function. LA mildly dilated & RA normal in size. RV normal in size and function. Pacer wire noted within right ventricle. No aortic stenosis with moderate regurgitation. Aortic root normal in size. Mild mitral regurgitation without stenosis. Flattening of the intraventricular septum consistent with right ventricular pressure and volume overload. Mild pulmonic regurgitation without stenosis. Severe tricuspid regurgitation. No pericardial  effusion. Study showed no vegetations but image quality was poor and study was technically difficult. CT R HIP W/O 8/6: IMPRESSION: 1. No acute fracture nor dislocation of either hip. No evidence of AVN. Probable right-sided gluteal tendinopathy. 2. The bones are demineralized in appearance. There is generalized muscle atrophy about the right hip. 3. Anasarca with moderate abdominopelvic ascites. 4. Moderate to large bilateral hydroceles. PORT CXR 8/6:  Previously reviewed by me. Right internal jugular central venous catheter into position. Stable volume loss on the right. No new focal opacity. Implanted device within the left chest wall with leads projecting to the heart. TEE 8/14:  EF 20% with diffuse hypokinesis. Moderate hypokinesis of RV. ICD/pacing wires noted without vegetation. Left atrium dilated without thrombus. Right atrium dilated and pacing wires without vegetation. Mild aortic insufficiency without vegetation. Mild mitral regurgitation without vegetation. Moderate to severe tricuspid regurgitation without vegetation. Pulmonic valve without vegetation. No PFO or ASD. No pericardial effusion.  MICROBIOLOGY: Blood Cultures x2 7/7:  Negative  MRSA PCR 8/5: Negative  Blood Cultures x2 8/4:  2/2 Bottles MSSA Urine Culture 8/4:  Negative  Blood Cultures x2 8/6:  Negative   ANTIBIOTICS: Vancomycin 8/5 Rifampin 8/6 - 8/7 Ancef 8/6 >>>  SIGNIFICANT EVENTS: 08/04 - Admit 08/05 - Started on CVVHD 08/15 - CVVHD stopped  08/17 - Still on Levophed  LINES/TUBES: Foley (out) R IJ TEMP HD CATHETER 8/5 >>> PIV  ASSESSMENT / PLAN:  PULMONARY A: Acute hypoxic respiratory failure: Resolved post procedure. Pulmonary hypertension: Noted on echocardiogram. Likely multifactorial. History of OSA  P:   Out of bed to chair as much as possible Continuing nocturnal CPAP Continuous pulse oximetry monitoring  CARDIOVASCULAR A:  Ischemic cardiomyopathy: Known history of coronary  artery disease. Shock: Combination of sepsis and cardiogenic shock. Status post ICD: Currently with bacteremia. Paroxysmal atrial fibrillation  P:  Continuing to wean Levophed for goal MAP >65 Monitoring with continuous telemetry Vitals per your protocol Cardiology following  Will need to have pacemaker removed given bacteremia   RENAL A:   Hyponatremia: M slightly worse today. Acute on chronic renal failure: Unknown prerenal state.  P:   Lasix challenge by nephrology Holding on renal replacement therapy Monitoring electrolytes daily   GASTROINTESTINAL A:   No acute issues. History of rectal cancer: Status post colostomy.  Liver cirrhosis  P:   Continuing today.   HEMATOLOGIC A:   Anemia: No signs of active bleeding. Hemoglobin stable. Thrombocytopenia: Continuing to improve.  Coagulopathy: Multifactorial from sepsis, antibiotics, Coumadin, and liver cirrhosis. Leukocytosis: Equivocal change.  P:  Continuing to hold Coumadin Trending cell counts daily with CBC Trending INR daily Transfuse for Hgb <7.5 or active bleeding  INFECTIOUS A:   Sepsis MSSA bacteremia: Hemodialysis catheter placed 8/5. ICD in place. Possible source right leg wound. TEE negative.  P:   Continuing antibiotics as above  ID recommending line holiday & removal of hardware  ENDOCRINE A:   Hyperglycemia: Glucose controlled. Multifactorial with steroids.  P:   Continuing Lantus  Continuing Accu-Cheks before every meal & at bedtime Continuing sliding scale insulin   NEUROLOGIC A:   Arthritis pain: Steadily improving.  P:   Continuing when necessary Norco  ORTHO A: Achilles Tendon Pain Arthritis/Hip Pain  P: Monitor  Prophylaxis:  SCDs & systemic anticoagulation. Diet:  Heart Healthy Carb Modified.  Code Status:  Full Code per previous physician discussions. Disposition:  Continuing to wean vasopressor support while critically ill.  Family Update:  Patient updated during  rounds today.  DISCUSSION:  61 y.o. male with some worsening and hypotension and still requiring vasopressor support. Likely multifactorial shock. Hopeful for renal recovery but suspect he may require renal replacement therapy further. We will need to coordinate with removal of catheter and pacer wires.  I have spent a total of 31 minutes of critical care time today caring for the patient, updating the patient at bedside, and reviewing the patient's electronic medical record.   Sonia Baller Ashok Cordia, M.D. [facility] Pager:  [phone] After 3pm or if no response, call [phone] 07/22/2017  10:12 AM

## 2017-07-23 LAB — TYPE AND SCREEN
ABO/RH(D): O POS
ANTIBODY SCREEN: NEGATIVE
Unit division: 0
Unit division: 0

## 2017-07-23 LAB — RENAL FUNCTION PANEL
ALBUMIN: 1.8 g/dL — AB (ref 3.5–5.0)
Anion gap: 10 (ref 5–15)
BUN: 37 mg/dL — AB (ref 6–20)
CHLORIDE: 96 mmol/L — AB (ref 101–111)
CO2: 22 mmol/L (ref 22–32)
Calcium: 8.1 mg/dL — ABNORMAL LOW (ref 8.9–10.3)
Creatinine, Ser: 2.66 mg/dL — ABNORMAL HIGH (ref 0.61–1.24)
GFR calc Af Amer: 28 mL/min — ABNORMAL LOW (ref >60)
GFR calc non Af Amer: 24 mL/min — ABNORMAL LOW (ref >60)
GLUCOSE: 80 mg/dL (ref 65–99)
PHOSPHORUS: 5 mg/dL — AB (ref 2.5–4.6)
POTASSIUM: 4.3 mmol/L (ref 3.5–5.1)
Sodium: 128 mmol/L — ABNORMAL LOW (ref 135–145)

## 2017-07-23 LAB — GLUCOSE, CAPILLARY
GLUCOSE-CAPILLARY: 92 mg/dL (ref 65–99)
GLUCOSE-CAPILLARY: 85 mg/dL (ref 65–99)
GLUCOSE-CAPILLARY: 99 mg/dL (ref 65–99)

## 2017-07-23 LAB — COOXEMETRY PANEL
CARBOXYHEMOGLOBIN: 3.1 % — AB (ref 0.5–1.5)
Methemoglobin: 1 % (ref 0.0–1.5)
O2 Saturation: 66 %
Total hemoglobin: 9.2 g/dL — ABNORMAL LOW (ref 12.0–16.0)

## 2017-07-23 LAB — BPAM RBC
BLOOD PRODUCT EXPIRATION DATE: 201809122359
Blood Product Expiration Date: 201809122359
ISSUE DATE / TIME: 201808171102
ISSUE DATE / TIME: 201808171245
UNIT TYPE AND RH: 5100
Unit Type and Rh: 5100

## 2017-07-23 LAB — CBC
HEMATOCRIT: 27.4 % — AB (ref 39.0–52.0)
HEMOGLOBIN: 8.9 g/dL — AB (ref 13.0–17.0)
MCH: 29 pg (ref 26.0–34.0)
MCHC: 32.5 g/dL (ref 30.0–36.0)
MCV: 89.3 fL (ref 78.0–100.0)
Platelets: 118 10*3/uL — ABNORMAL LOW (ref 150–400)
RBC: 3.07 MIL/uL — AB (ref 4.22–5.81)
RDW: 17.1 % — ABNORMAL HIGH (ref 11.5–15.5)
WBC: 10.6 10*3/uL — AB (ref 4.0–10.5)

## 2017-07-23 LAB — MAGNESIUM: MAGNESIUM: 2.1 mg/dL (ref 1.7–2.4)

## 2017-07-23 LAB — PROTIME-INR
INR: 1.48
PROTHROMBIN TIME: 18.1 s — AB (ref 11.4–15.2)

## 2017-07-23 LAB — HEPARIN LEVEL (UNFRACTIONATED)
HEPARIN UNFRACTIONATED: 0.31 [IU]/mL (ref 0.30–0.70)
HEPARIN UNFRACTIONATED: 0.29 [IU]/mL — AB (ref 0.30–0.70)

## 2017-07-23 MED ORDER — MIDODRINE HCL 5 MG PO TABS
5.0000 mg | ORAL_TABLET | Freq: Three times a day (TID) | ORAL | Status: DC
  Administered 2017-07-23 – 2017-07-25 (×8): 5 mg via ORAL
  Filled 2017-07-23 (×9): qty 1

## 2017-07-23 NOTE — Progress Notes (Signed)
ANTICOAGULATION CONSULT NOTE - Follow Up Consult  Pharmacy Consult for Heparin  Indication: atrial fibrillation  No Known Allergies  Patient Measurements: Height: 5\' 10"  (177.8 cm) Weight: 191 lb 5.8 oz (86.8 kg) IBW/kg (Calculated) : 73  Vital Signs: Temp: 98.1 F (36.7 C) (08/18 0400) Temp Source: Oral (08/18 0400) BP: 99/54 (08/18 0500) Pulse Rate: 73 (08/18 0500)  Labs:  Recent Labs  07/21/17 0408  07/22/17 0500 07/22/17 1700 07/22/17 1800 07/22/17 2132 07/23/17 0414  HGB 7.6*  --  7.4* 8.9*  --   --  8.9*  HCT 24.2*  --  23.6* 27.3*  --   --  27.4*  PLT 126*  --  167  --   --   --  PENDING  LABPROT 20.5*  --  19.1*  --   --   --   --   INR 1.73  --  1.58  --   --   --   --   HEPARINUNFRC 0.41  --  0.22*  --  0.89*  --  0.31  CREATININE 1.79*  < > 2.30*  --   --  2.53* 2.66*  < > = values in this interval not displayed.  Estimated Creatinine Clearance: 29.7 mL/min (A) (by C-G formula based on SCr of 2.66 mg/dL (H)).    Assessment: Heparin while warfarin on hold, heparin level is therapeutic x 1 this AM after rate decrease last night  Goal of Therapy:  Heparin level 0.3-0.7 units/ml Monitor platelets by anticoagulation protocol: Yes   Plan:  -Cont heparin at 1550 units/hr -1200 HL  Nalleli Largent 07/23/2017,5:48 AM

## 2017-07-23 NOTE — Progress Notes (Signed)
PULMONARY / CRITICAL CARE MEDICINE   Name: [name] MRN: [id number] DOB: 1956/03/31    ADMISSION DATE:  07/09/2017   CONSULTATION DATE:  07/10/2017  REFERRING MD:  Sarajane Jews  CHIEF COMPLAINT:  dyspnea  HISTORY OF PRESENT ILLNESS:  61 y.o. male with history of ischemic cardiomyopathy status post ICD placement. Also history of chronic renal failure. Admitted on 8/4 with acute respiratory failure secondary to pulmonary edema. Subsequently found to have a MSSA bacteremia. Patient has had persistent shock requiring vasopressor support and is on renal replacement therapy.   SUBJECTIVE:  Off levo today am No distress, eating breakfast.   REVIEW OF SYSTEMS:  No complaints  VITAL SIGNS: BP (!) 98/53   Pulse 73   Temp 98 F (36.7 C) (Oral)   Resp (!) 26   Ht 5\' 10"  (1.778 m)   Wt 86.8 kg (191 lb 5.8 oz)   SpO2 99%   BMI 27.46 kg/m   HEMODYNAMICS: CVP:  [15 mmHg-22 mmHg] 22 mmHg  VENTILATOR SETTINGS:    INTAKE / OUTPUT: I/O last 3 completed shifts: In: 1998.7 [I.V.:1077.7; Blood:655; IV Piggyback:266] Out: 7793 [Urine:1475]  PHYSICAL EXAMINATION: Gen:      No acute distress HEENT:  EOMI, sclera anicteric Neck:     No masses; no thyromegaly Lungs:    Clear to auscultation bilaterally; normal respiratory effort CV:         Regular rate and rhythm; no murmurs Abd:      + bowel sounds; soft, non-tender; no palpable masses, no distension Ext:    No edema; adequate peripheral perfusion Skin:      Warm and dry; no rash Neuro: alert and oriented x 3 Psych: normal mood and affect   LABS:  BMET  Recent Labs Lab 07/22/17 0500 07/22/17 2132 07/23/17 0414  NA 128* 127* 128*  K 4.7 4.4 4.3  CL 96* 97* 96*  CO2 23 23 22   BUN 31* 36* 37*  CREATININE 2.30* 2.53* 2.66*  GLUCOSE 149* 79 80    Electrolytes  Recent Labs Lab 07/21/17 0408  07/22/17 0500 07/22/17 2132 07/23/17 0414  CALCIUM 8.1*  < > 8.2* 8.1* 8.1*  MG 2.2  --  2.2  --  2.1  PHOS 3.3  < > 4.8* 4.9*  5.0*  < > = values in this interval not displayed.  CBC  Recent Labs Lab 07/21/17 0408 07/22/17 0500 07/22/17 1700 07/23/17 0414  WBC 13.8* 13.7*  --  10.6*  HGB 7.6* 7.4* 8.9* 8.9*  HCT 24.2* 23.6* 27.3* 27.4*  PLT 126* 167  --  118*    Coag's  Recent Labs Lab 07/21/17 0408 07/22/17 0500 07/23/17 0414  INR 1.73 1.58 1.48    Sepsis Markers No results for input(s): LATICACIDVEN, PROCALCITON, O2SATVEN in the last 168 hours.  ABG No results for input(s): PHART, PCO2ART, PO2ART in the last 168 hours.  Liver Enzymes  Recent Labs Lab 07/22/17 0803 07/22/17 2132 07/23/17 0414  AST 25  --   --   ALT 5*  --   --   ALKPHOS 135*  --   --   BILITOT 1.3*  --   --   ALBUMIN 1.9* 1.7* 1.8*    Cardiac Enzymes No results for input(s): TROPONINI, PROBNP in the last 168 hours.  Glucose  Recent Labs Lab 07/21/17 2150 07/22/17 0830 07/22/17 1157 07/22/17 1626 07/22/17 2053 07/23/17 0756  GLUCAP 154* 156* 142* 90 88 92    Imaging No results found.   IMAGING/STUDIES: CT  ABD/PELVIS W/O 8/5: IMPRESSION: 1. Cirrhosis. Moderate ascites. Anasarca. 2. Diverting colostomy without bowel obstruction. Similar thickened rectum compatible with treatment related changes. 3. Mild cardiomegaly. Moderate RIGHT and small LEFT pleural effusions. TTE 8/5:  LV mildly dilated with mild concentric hypertrophy. EF 15-20% with diffuse hypokinesis and apical akinesis. Not sufficient to allow assessment of diastolic function. LA mildly dilated & RA normal in size. RV normal in size and function. Pacer wire noted within right ventricle. No aortic stenosis with moderate regurgitation. Aortic root normal in size. Mild mitral regurgitation without stenosis. Flattening of the intraventricular septum consistent with right ventricular pressure and volume overload. Mild pulmonic regurgitation without stenosis. Severe tricuspid regurgitation. No pericardial effusion. Study showed no vegetations but  image quality was poor and study was technically difficult. CT R HIP W/O 8/6: IMPRESSION: 1. No acute fracture nor dislocation of either hip. No evidence of AVN. Probable right-sided gluteal tendinopathy. 2. The bones are demineralized in appearance. There is generalized muscle atrophy about the right hip. 3. Anasarca with moderate abdominopelvic ascites. 4. Moderate to large bilateral hydroceles. PORT CXR 8/6:  Previously reviewed by me. Right internal jugular central venous catheter into position. Stable volume loss on the right. No new focal opacity. Implanted device within the left chest wall with leads projecting to the heart. TEE 8/14:  EF 20% with diffuse hypokinesis. Moderate hypokinesis of RV. ICD/pacing wires noted without vegetation. Left atrium dilated without thrombus. Right atrium dilated and pacing wires without vegetation. Mild aortic insufficiency without vegetation. Mild mitral regurgitation without vegetation. Moderate to severe tricuspid regurgitation without vegetation. Pulmonic valve without vegetation. No PFO or ASD. No pericardial effusion.  MICROBIOLOGY: Blood Cultures x2 7/7:  Negative  MRSA PCR 8/5: Negative  Blood Cultures x2 8/4:  2/2 Bottles MSSA Urine Culture 8/4:  Negative  Blood Cultures x2 8/6:  Negative   ANTIBIOTICS: Vancomycin 8/5 Rifampin 8/6 - 8/7 Ancef 8/6 >>>  SIGNIFICANT EVENTS: 08/04 - Admit 08/05 - Started on CVVHD 08/15 - CVVHD stopped   LINES/TUBES: Foley (out) R IJ TEMP HD CATHETER 8/5 >>> PIV  ASSESSMENT / PLAN:  61 y.o. male with some worsening and hypotension and still requiring vasopressor support. Likely multifactorial shock. Hopeful for renal recovery trying lasix challenge  PULMONARY A: Acute hypoxic respiratory failure: Resolved post procedure. Pulmonary hypertension: Noted on echocardiogram. Likely multifactorial. History of OSA  P:   Nocturnal CPAP OOB, incentive spirometer  CARDIOVASCULAR A:  Ischemic  cardiomyopathy: Known history of coronary artery disease. Shock: Combination of sepsis and cardiogenic shock. Status post ICD: Currently with bacteremia. Paroxysmal atrial fibrillation  P:  Off levo. Monitor MAPs Cardiology is on board Heparin gtt  RENAL A:   Hyponatremia: M slightly worse today. Acute on chronic renal failure: Unknown prerenal state.  P:   Continue lasix per nephrology Monitor urine output and cr  GASTROINTESTINAL A:   No acute issues. History of rectal cancer: Status post colostomy.  Liver cirrhosis  P:   PO diet  HEMATOLOGIC A:   Anemia: No signs of active bleeding. Hemoglobin stable. Thrombocytopenia: Continuing to improve.  Coagulopathy: Multifactorial from sepsis, antibiotics, Coumadin, and liver cirrhosis. Leukocytosis: Equivocal change.  P:  Follow CBC  INFECTIOUS A:   Sepsis MSSA bacteremia: Hemodialysis catheter placed 8/5. ICD in place. Possible source right leg wound. TEE negative.  P:   Continue abx as above.  ID is recommending pacer removal but  may need to stay in per cardiology.   ENDOCRINE A:   Hyperglycemia: Glucose controlled. Multifactorial  with steroids.  P:   Lantus, SSI  NEUROLOGIC A:   Arthritis pain: Steadily improving.  P:   PRN norco  ORTHO A: Achilles Tendon Pain Arthritis/Hip Pain  P: No acute interventions  Prophylaxis:  SCDs & heparin gtt Diet:  PO diet Code Status: Full code Disposition: Keep in ICU 24 more hr to make sure he does not need to go back on pressors Family Update:  Patient updated during rounds today.  The patient is critically ill with multiple organ system failure and requires high complexity decision making for assessment and support, frequent evaluation and titration of therapies, advanced monitoring, review of radiographic studies and interpretation of complex data.   Critical Care Time devoted to patient care services, exclusive of separately billable procedures, described  in this note is 35  minutes.   Marshell Garfinkel MD [place] [facility] Pager [phone] If no answer or after 3pm call: [phone] 07/23/2017, 8:53 AM

## 2017-07-23 NOTE — Progress Notes (Signed)
Patient ID: [name], male   DOB: 1956-03-08, 61 y.o.   MRN: [id number] S:No complaints and was able to be weaned off of levophed this am O:BP (!) 94/51   Pulse 78   Temp 98 F (36.7 C) (Oral)   Resp 10   Ht 5\' 10"  (1.778 m)   Wt 86.8 kg (191 lb 5.8 oz)   SpO2 100%   BMI 27.46 kg/m   Intake/Output Summary (Last 24 hours) at 07/23/17 1012 Last data filed at 07/23/17 1000  Gross per 24 hour  Intake           1992.7 ml  Output             1060 ml  Net            932.7 ml   Intake/Output: I/O last 3 completed shifts: In: 2024.7 [I.V.:1103.7; Blood:655; IV Piggyback:266] Out: 2536 [UYQIH:4742]  Intake/Output this shift:  Total I/O In: 291.9 [P.O.:240; I.V.:51.9] Out: 75 [Urine:75] Weight change: -0.7 kg (-1 lb 8.7 oz) Gen: NAD CVS: no rub Resp: decreased BS Abd: benign VZD:GLOVF edema RUE AVF +T/B   Recent Labs Lab 07/20/17 0425 07/20/17 1544 07/21/17 0408 07/21/17 1508 07/22/17 0500 07/22/17 0803 07/22/17 2132 07/23/17 0414  NA 133* 132* 131* 130* 128*  --  127* 128*  K 4.6 4.6 4.6 4.5 4.7  --  4.4 4.3  CL 100* 100* 99* 98* 96*  --  97* 96*  CO2 27 25 25 25 23   --  23 22  GLUCOSE 145* 149* 142* 103* 149*  --  79 80  BUN 13 17 21* 27* 31*  --  36* 37*  CREATININE 1.10 1.37* 1.79* 2.02* 2.30*  --  2.53* 2.66*  ALBUMIN 2.0* 2.0* 1.9* 1.8* 1.9* 1.9* 1.7* 1.8*  CALCIUM 8.1* 8.1* 8.1* 8.1* 8.2*  --  8.1* 8.1*  PHOS 2.4* 3.1 3.3 4.0 4.8*  --  4.9* 5.0*  AST  --   --   --   --   --  25  --   --   ALT  --   --   --   --   --  5*  --   --    Liver Function Tests:  Recent Labs Lab 07/22/17 0803 07/22/17 2132 07/23/17 0414  AST 25  --   --   ALT 5*  --   --   ALKPHOS 135*  --   --   BILITOT 1.3*  --   --   PROT 6.4*  --   --   ALBUMIN 1.9* 1.7* 1.8*   No results for input(s): LIPASE, AMYLASE in the last 168 hours. No results for input(s): AMMONIA in the last 168 hours. CBC:  Recent Labs Lab 07/19/17 0451 07/20/17 0428 07/21/17 0408 07/22/17 0500  07/22/17 1700 07/23/17 0414  WBC 15.0* 12.5* 13.8* 13.7*  --  10.6*  HGB 7.9* 7.7* 7.6* 7.4* 8.9* 8.9*  HCT 26.2* 25.3* 24.2* 23.6* 27.3* 27.4*  MCV 94.9 95.8 94.5 92.9  --  89.3  PLT 145* 136* 126* 167  --  118*   Cardiac Enzymes: No results for input(s): CKTOTAL, CKMB, CKMBINDEX, TROPONINI in the last 168 hours. CBG:  Recent Labs Lab 07/22/17 0830 07/22/17 1157 07/22/17 1626 07/22/17 2053 07/23/17 0756  GLUCAP 156* 142* 90 88 92    Iron Studies: No results for input(s): IRON, TIBC, TRANSFERRIN, FERRITIN in the last 72 hours. Studies/Results: No results found. . Chlorhexidine Gluconate Cloth  6 each  Topical Daily  . colchicine  0.3 mg Oral Daily  . darbepoetin (ARANESP) injection - NON-DIALYSIS  60 mcg Subcutaneous Q Wed-1800  . insulin aspart  0-5 Units Subcutaneous QHS  . insulin aspart  0-9 Units Subcutaneous TID WC  . insulin aspart  3 Units Subcutaneous TID WC  . insulin glargine  10 Units Subcutaneous QHS  . midodrine  5 mg Oral TID WC  . sodium chloride flush  10-40 mL Intracatheter Q12H    BMET    Component Value Date/Time   NA 128 (L) 07/23/2017 0414   K 4.3 07/23/2017 0414   CL 96 (L) 07/23/2017 0414   CO2 22 07/23/2017 0414   GLUCOSE 80 07/23/2017 0414   BUN 37 (H) 07/23/2017 0414   CREATININE 2.66 (H) 07/23/2017 0414   CREATININE 2.67 (H) 03/18/2016 0917   CALCIUM 8.1 (L) 07/23/2017 0414   CALCIUM 7.9 (L) 07/12/2017 0723   GFRNONAA 24 (L) 07/23/2017 0414   GFRAA 28 (L) 07/23/2017 0414   CBC    Component Value Date/Time   WBC 10.6 (H) 07/23/2017 0414   RBC 3.07 (L) 07/23/2017 0414   HGB 8.9 (L) 07/23/2017 0414   HGB 14.6 05/02/2012 0852   HCT 27.4 (L) 07/23/2017 0414   HCT 23.6 (L) 04/24/2015 1116   HCT 43.6 05/02/2012 0852   PLT 118 (L) 07/23/2017 0414   PLT 163 05/02/2012 0852   MCV 89.3 07/23/2017 0414   MCV 93.3 05/02/2012 0852   MCH 29.0 07/23/2017 0414   MCHC 32.5 07/23/2017 0414   RDW 17.1 (H) 07/23/2017 0414   RDW 14.1  05/02/2012 0852   LYMPHSABS 0.3 (L) 07/12/2017 0424   LYMPHSABS 1.2 05/02/2012 0852   MONOABS 1.2 (H) 07/12/2017 0424   MONOABS 0.6 05/02/2012 0852   EOSABS 0.3 07/12/2017 0424   EOSABS 0.0 05/02/2012 0852   BASOSABS 0.0 07/12/2017 0424   BASOSABS 0.0 05/02/2012 0852      A/P  1. AKI/CKD 4- in setting of MSSA bacteremia and ischemic cardiomyopathy/hypotension/volume overload. Admitted 07/09/17 with respiratory failuer and demand ischemia improved with initiation of CVVHD. Off of CVVHD since 07/20/17 and required increasing doses of IV lasix.   1. Currently off of pressors 2. Start po midodrine 5mg  tid and follow UOP and I's/O's 3. Rise of Scr has slowed and had 650cc of urine over last 24 hours. 4. continue to hold CVVHD for now and hopefully can avoid further dialysis. 2. Acute on chronic CHF/ischemic CMP 3. MSSA bacteremia- unclear etiology. On abx and TEE negative for SBE. 4. Anemia- transfuse prn and continue ESA 5. Hyponatremia- due to CHF/anasarca improved with volume removal 6. Cirrhosis- elevated t bili and abnormal lft's off of statin and improving 7. Thrombocytopenia- may need to stop heparin, continue to follow. 8. Atrial fib- per cards  Donetta Potts, MD [facility] [phone]

## 2017-07-23 NOTE — Progress Notes (Signed)
Patient ID: [name], male   DOB: [birth year], 61 y.o.   MRN: [id number]     Advanced Heart Failure Rounding Note  Primary Cardiologist: [name]  Subjective:    Admitted with staph bacteremia with mixed septic/cardiogenic shock.   TEE 07/19/17 with LVEF 20%. No evidence of endocarditis or vegetation on valves/ICD  CVVD stopped 07/20/17.    Creatinine 1.3 -> 1.7 -> 2.3 -> 2.6   NE weaned off this am. SBPs in 90s. Feels better. Denies dyspnea. CVP 16-17 range  Objective:   Weight Range: 86.8 kg (191 lb 5.8 oz) Body mass index is 27.46 kg/m.   Vital Signs:   Temp:  [97.8 F (36.6 C)-98.4 F (36.9 C)] 98 F (36.7 C) (08/18 0758) Pulse Rate:  [70-78] 73 (08/18 1000) Resp:  [10-28] 12 (08/18 1000) BP: (85-120)/(47-63) 96/61 (08/18 1000) SpO2:  [97 %-100 %] 100 % (08/18 1000) Weight:  [86.8 kg (191 lb 5.8 oz)] 86.8 kg (191 lb 5.8 oz) (08/18 0500) Last BM Date: 07/22/17  Weight change: Filed Weights   07/21/17 0400 07/22/17 0500 07/23/17 0500  Weight: 84.1 kg (185 lb 6.5 oz) 87.5 kg (192 lb 14.4 oz) 86.8 kg (191 lb 5.8 oz)    Intake/Output:   Intake/Output Summary (Last 24 hours) at 07/23/17 1120 Last data filed at 07/23/17 1000  Gross per 24 hour  Intake           2018.2 ml  Output             1060 ml  Net            958.2 ml      Physical Exam   CVP 16-17  General: Lying in bed. No resp difficulty HEENT: Normal anicteric Neck: Supple. JVP to jaw. RIJ trialysis cath .  No thyromegaly or nodule noted. Cor: PMI laterally displaced RRR 2/6 AS/TR Lungs: Clear anteriorly no wheezing Abdomen: Soft NT/ND good BS + colostomy bag Extremities: No cyanosis, clubbing, or rash. Wounds on LE wrapped. 2+ thigh edema persists  Neuro: alert & oriented x 3, cranial nerves grossly intact. moves all 4 extremities w/o difficulty. Affect pleasant   Telemetry    BiV pacing 70s, underlying Afib. (Personally reviewed)  Labs    CBC  Recent Labs  07/22/17 0500  07/22/17 1700 07/23/17 0414  WBC 13.7*  --  10.6*  HGB 7.4* 8.9* 8.9*  HCT 23.6* 27.3* 27.4*  MCV 92.9  --  89.3  PLT 167  --  118*   Basic Metabolic Panel  Recent Labs  07/22/17 0500 07/22/17 2132 07/23/17 0414  NA 128* 127* 128*  K 4.7 4.4 4.3  CL 96* 97* 96*  CO2 23 23 22   GLUCOSE 149* 79 80  BUN 31* 36* 37*  CREATININE 2.30* 2.53* 2.66*  CALCIUM 8.2* 8.1* 8.1*  MG 2.2  --  2.1  PHOS 4.8* 4.9* 5.0*   Liver Function Tests  Recent Labs  07/22/17 0803 07/22/17 2132 07/23/17 0414  AST 25  --   --   ALT 5*  --   --   ALKPHOS 135*  --   --   BILITOT 1.3*  --   --   PROT 6.4*  --   --   ALBUMIN 1.9* 1.7* 1.8*   No results for input(s): LIPASE, AMYLASE in the last 72 hours. Cardiac Enzymes No results for input(s): CKTOTAL, CKMB, CKMBINDEX, TROPONINI in the last 72 hours.  BNP: BNP (last 3 results)  Recent Labs  06/13/17  1205 07/09/17 2151  BNP 1,151.0* 1,553.9*    ProBNP (last 3 results) No results for input(s): PROBNP in the last 8760 hours.   D-Dimer No results for input(s): DDIMER in the last 72 hours. Hemoglobin A1C No results for input(s): HGBA1C in the last 72 hours. Fasting Lipid Panel No results for input(s): CHOL, HDL, LDLCALC, TRIG, CHOLHDL, LDLDIRECT in the last 72 hours. Thyroid Function Tests No results for input(s): TSH, T4TOTAL, T3FREE, THYROIDAB in the last 72 hours.  Invalid input(s): FREET3  Other results:   Imaging   No results found.  Medications:     Scheduled Medications: . Chlorhexidine Gluconate Cloth  6 each Topical Daily  . colchicine  0.3 mg Oral Daily  . darbepoetin (ARANESP) injection - NON-DIALYSIS  60 mcg Subcutaneous Q Wed-1800  . insulin aspart  0-5 Units Subcutaneous QHS  . insulin aspart  0-9 Units Subcutaneous TID WC  . insulin aspart  3 Units Subcutaneous TID WC  . insulin glargine  10 Units Subcutaneous QHS  . midodrine  5 mg Oral TID WC  . sodium chloride flush  10-40 mL Intracatheter Q12H     Infusions: . sodium chloride 250 mL (07/23/17 0800)  . sodium chloride 20 mL/hr at 07/21/17 1234  .  ceFAZolin (ANCEF) IV Stopped (07/23/17 0615)  . furosemide 160 mg (07/23/17 0950)  . heparin 1,550 Units/hr (07/23/17 0700)  . heparin 999 mL (07/17/17 2143)  . norepinephrine (LEVOPHED) Adult infusion Stopped (07/23/17 0845)  . dialysis replacement fluid (prismasate) 300 mL/hr at 07/20/17 0813  . dialysis replacement fluid (prismasate) 200 mL/hr at 07/20/17 0735  . dialysate (PRISMASATE) 1,000 mL/hr at 07/20/17 1117    PRN Medications: sodium chloride, acetaminophen, alum & mag hydroxide-simeth, fentaNYL (SUBLIMAZE) injection, heparin, heparin, heparin, HYDROcodone-acetaminophen, magic mouthwash w/lidocaine, ondansetron (ZOFRAN) IV, sodium chloride flush    Patient Profile   [name] is a 61 year old with a history of DMII, SAH 2016, rectal cancer, CKD Stage IV, chronic A fib, S/P AVN ablation, PAF, CAD BMS LAD 2011 BMS LAD 2009, OSA, chronic systolic heart failure, St Jude BiV ICD admitted with AKI and shock with elevated lactate, suspect cardiogenic.   Assessment/Plan   1. Acute on chronic systolic CHF/cardiogenic shock: Last echo in 8/17 with EF 15-20%.  Ischemic cardiomyopathy, has St Jude BiV ICD (despite chronic afib appears to be effectively BiV pacing).  Had recent admission (discharged 06/21/17) requiring milrinone support and diuresis.  Admitted with soft BP (SBP 80s-90s) + elevated lactate.  Suspect mixed septic/cardiogenic shock with MSSA bacteremia.  CVVH begun and ongoing.   - CVVHD stopped 07/20/17.  - Starting to re-accumulate volume but respiratory status ok - CVP 16. Co-ox 66%. Now off norepi - Discussed with Dr. Arrie Aran. Will continue IV lasix. Start midodrine for BP support.  - Hppefully will have renal recovery. If not, will need to tolerate iHD prior to d/c otherwise only option will be comfort care 2. AKI on CKD stage 4:  - Baseline creatinine around 2.5.   He required brief HD in the past, has RUE fistula. - Started CRRT 8/6. CVVHD stopped 8/15. CVP going up. Urine output improving on lasix - Plan as above 3. Atrial fibrillation: Chronic - warfarin on hold. - Remains on heparin. D/w pharmD 4. CAD:  - No CP. Statin on hold for now. Holding ASA.     5. ID:  - Blood CX 2/2 MSSA. Antibiotics narrowed, now on Ancef. 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  -  Per Dr. Daiva Eves with ID note, pt needs line holiday and eventual ICD extraction for total cure.  - Not planning ICD extraction for now due to severe comorbidities. He is device dependent. Once IV abx complete will plan suppressive abx. If recovers further can reconsider device extraction. D/w EP - Rifampin restarted 8/17 - TEE 07/19/17 with no evidence of endocarditis.   - No change.  6. H/O Rectal CA with diverting colostomy  7. Type II diabetes: Sliding scale.  - No change.   8. Liver failure:  - Stable.  9. Anemia of chronic disease. - s/p RBCs on 8/17. Hgb 8.9 this am. - Aranesp ordered.  Length of Stay: 13  Arvilla Meres, MD  07/23/2017, 11:20 AM  Advanced Heart Failure Team Pager [phone] (M-F; 7a - 4p)  Please contact [facility] for night-coverage after hours (4p -7a ) and weekends on [email or web address]

## 2017-07-23 NOTE — Progress Notes (Signed)
Patient refuses the use of CPAP 

## 2017-07-23 NOTE — Progress Notes (Signed)
ANTICOAGULATION CONSULT NOTE - Follow Up Consult  Pharmacy Consult for Heparin  Indication: atrial fibrillation  No Known Allergies  Patient Measurements: Height: 5\' 10"  (177.8 cm) Weight: 191 lb 5.8 oz (86.8 kg) IBW/kg (Calculated) : 73  Vital Signs: Temp: 97.5 F (36.4 C) (08/18 1220) Temp Source: Oral (08/18 1220) BP: 86/54 (08/18 1200) Pulse Rate: 76 (08/18 1200)  Labs:  Recent Labs  07/21/17 0408  07/22/17 0500 07/22/17 1700 07/22/17 1800 07/22/17 2132 07/23/17 0414 07/23/17 1314  HGB 7.6*  --  7.4* 8.9*  --   --  8.9*  --   HCT 24.2*  --  23.6* 27.3*  --   --  27.4*  --   PLT 126*  --  167  --   --   --  118*  --   LABPROT 20.5*  --  19.1*  --   --   --  18.1*  --   INR 1.73  --  1.58  --   --   --  1.48  --   HEPARINUNFRC 0.41  --  0.22*  --  0.89*  --  0.31 0.29*  CREATININE 1.79*  < > 2.30*  --   --  2.53* 2.66*  --   < > = values in this interval not displayed.  Estimated Creatinine Clearance: 29.7 mL/min (A) (by C-G formula based on SCr of 2.66 mg/dL (H)).   Assessment: 60yo male subtherapeutic on heparin with initial dosing while Coumadin on hold. Heparin dosing rate is 90.7 kg.   Heparin level this afternoon just slightly below goal range, essentially unchanged from AM level.  No bleeding or complications noted.  CBC stable.  CRRT discontinued 8/16. Scr changed from 1.79>2.66 today since discontinuation.  Goal of Therapy:  Heparin level 0.3-0.7 units/ml Monitor platelets by anticoagulation protocol: Yes   Plan:  -Cont heparin at 1550 units/hr - won't increase since essentially at goal, and given anemia and need for PRBCs. -Daily heparin level and CBC. -F/u plans for oral anticoagulation eventually  Uvaldo Rising, BCPS  Clinical Pharmacist Pager [phone]  07/23/2017 1:48 PM

## 2017-07-24 ENCOUNTER — Inpatient Hospital Stay (HOSPITAL_COMMUNITY): Payer: Medicare Other

## 2017-07-24 LAB — RENAL FUNCTION PANEL
ANION GAP: 10 (ref 5–15)
Albumin: 1.7 g/dL — ABNORMAL LOW (ref 3.5–5.0)
BUN: 42 mg/dL — ABNORMAL HIGH (ref 6–20)
CHLORIDE: 95 mmol/L — AB (ref 101–111)
CO2: 22 mmol/L (ref 22–32)
CREATININE: 2.84 mg/dL — AB (ref 0.61–1.24)
Calcium: 8.1 mg/dL — ABNORMAL LOW (ref 8.9–10.3)
GFR calc non Af Amer: 22 mL/min — ABNORMAL LOW (ref >60)
GFR, EST AFRICAN AMERICAN: 26 mL/min — AB (ref >60)
Glucose, Bld: 104 mg/dL — ABNORMAL HIGH (ref 65–99)
POTASSIUM: 4 mmol/L (ref 3.5–5.1)
Phosphorus: 5.1 mg/dL — ABNORMAL HIGH (ref 2.5–4.6)
Sodium: 127 mmol/L — ABNORMAL LOW (ref 135–145)

## 2017-07-24 LAB — COOXEMETRY PANEL
CARBOXYHEMOGLOBIN: 2.1 % — AB (ref 0.5–1.5)
METHEMOGLOBIN: 1 % (ref 0.0–1.5)
O2 SAT: 75.6 %
TOTAL HEMOGLOBIN: 9.2 g/dL — AB (ref 12.0–16.0)

## 2017-07-24 LAB — CBC
HCT: 27.8 % — ABNORMAL LOW (ref 39.0–52.0)
Hemoglobin: 8.9 g/dL — ABNORMAL LOW (ref 13.0–17.0)
MCH: 28.9 pg (ref 26.0–34.0)
MCHC: 32 g/dL (ref 30.0–36.0)
MCV: 90.3 fL (ref 78.0–100.0)
PLATELETS: 115 10*3/uL — AB (ref 150–400)
RBC: 3.08 MIL/uL — ABNORMAL LOW (ref 4.22–5.81)
RDW: 16.9 % — AB (ref 11.5–15.5)
WBC: 10.2 10*3/uL (ref 4.0–10.5)

## 2017-07-24 LAB — GLUCOSE, CAPILLARY
GLUCOSE-CAPILLARY: 111 mg/dL — AB (ref 65–99)
GLUCOSE-CAPILLARY: 148 mg/dL — AB (ref 65–99)
Glucose-Capillary: 101 mg/dL — ABNORMAL HIGH (ref 65–99)
Glucose-Capillary: 111 mg/dL — ABNORMAL HIGH (ref 65–99)
Glucose-Capillary: 119 mg/dL — ABNORMAL HIGH (ref 65–99)

## 2017-07-24 LAB — PROTIME-INR
INR: 1.45
Prothrombin Time: 17.8 s — ABNORMAL HIGH (ref 11.4–15.2)

## 2017-07-24 LAB — HEPARIN LEVEL (UNFRACTIONATED)
Heparin Unfractionated: 0.21 [IU]/mL — ABNORMAL LOW (ref 0.30–0.70)
Heparin Unfractionated: 0.38 [IU]/mL (ref 0.30–0.70)

## 2017-07-24 LAB — MAGNESIUM: Magnesium: 1.9 mg/dL (ref 1.7–2.4)

## 2017-07-24 MED ORDER — WARFARIN SODIUM 2 MG PO TABS
2.0000 mg | ORAL_TABLET | Freq: Once | ORAL | Status: AC
  Administered 2017-07-24: 2 mg via ORAL
  Filled 2017-07-24: qty 1

## 2017-07-24 MED ORDER — WARFARIN - PHARMACIST DOSING INPATIENT
Freq: Every day | Status: DC
  Administered 2017-07-25: 18:00:00

## 2017-07-24 NOTE — Progress Notes (Signed)
There is not a CPAP in the PT's room. PT refuses CPAP. RT will continue to monitor PT closely.

## 2017-07-24 NOTE — Progress Notes (Signed)
[facility] Progress Note Patient Name: [name] DOB: 1956-10-15 MRN: [id number]   Date of Service  07/24/2017  HPI/Events of Note  Hypotension? - Called d/t BP = 86/54 with MAP = 65. BP when seen on rounds today documented as 90/54. Patient is able to follow commands and is O X 3. He has ischemic cardiomyopathy and appears to live around this BP.  eICU Interventions  Continue current management.      Intervention Category Major Interventions: Hypotension - evaluation and management  Khristina Janota Eugene 07/24/2017, 11:44 PM

## 2017-07-24 NOTE — Progress Notes (Signed)
PULMONARY / CRITICAL CARE MEDICINE   Name: [name] MRN: [id number] DOB: [birth year]    ADMISSION DATE:  07/09/2017   CONSULTATION DATE:  07/10/2017  REFERRING MD:  Sarajane Jews  CHIEF COMPLAINT:  dyspnea  HISTORY OF PRESENT ILLNESS:  61 y.o. male with history of ischemic cardiomyopathy status post ICD placement. Also history of chronic renal failure. Admitted on 8/4 with acute respiratory failure secondary to pulmonary edema. Subsequently found to have a MSSA bacteremia. Patient has had persistent shock requiring vasopressor support and is on renal replacement therapy.   SUBJECTIVE:   No issues overnight Remains off levo  REVIEW OF SYSTEMS:  No complaints  VITAL SIGNS: BP (!) 90/54   Pulse 77   Temp 97.7 F (36.5 C) (Oral)   Resp 10   Ht 5\' 10"  (1.778 m)   Wt 85.7 kg (188 lb 15 oz)   SpO2 100%   BMI 27.11 kg/m   HEMODYNAMICS: CVP:  [10 mmHg-15 mmHg] 14 mmHg  VENTILATOR SETTINGS:    INTAKE / OUTPUT: I/O last 3 completed shifts: In: 1924.7 [P.O.:720; I.V.:696.7; IV Piggyback:508] Out: 1751 [Urine:1751]  PHYSICAL EXAMINATION: Gen:      No acute distress HEENT:  EOMI, sclera anicteric Neck:     No masses; no thyromegaly Lungs:    Clear to auscultation bilaterally; normal respiratory effort CV:         Regular rate and rhythm; no murmurs Abd:      + bowel sounds; soft, non-tender; no palpable masses, no distension Ext:    No edema; adequate peripheral perfusion Skin:      Warm and dry; no rash Neuro: alert and oriented x 3 Psych: normal mood and affect  LABS:  BMET  Recent Labs Lab 07/22/17 2132 07/23/17 0414 07/24/17 0500  NA 127* 128* 127*  K 4.4 4.3 4.0  CL 97* 96* 95*  CO2 23 22 22   BUN 36* 37* 42*  CREATININE 2.53* 2.66* 2.84*  GLUCOSE 79 80 104*    Electrolytes  Recent Labs Lab 07/22/17 0500 07/22/17 2132 07/23/17 0414 07/24/17 0336 07/24/17 0500  CALCIUM 8.2* 8.1* 8.1*  --  8.1*  MG 2.2  --  2.1 1.9  --   PHOS 4.8* 4.9* 5.0*  --   5.1*    CBC  Recent Labs Lab 07/22/17 0500 07/22/17 1700 07/23/17 0414 07/24/17 0336  WBC 13.7*  --  10.6* 10.2  HGB 7.4* 8.9* 8.9* 8.9*  HCT 23.6* 27.3* 27.4* 27.8*  PLT 167  --  118* 115*    Coag's  Recent Labs Lab 07/22/17 0500 07/23/17 0414 07/24/17 0336  INR 1.58 1.48 1.45    Sepsis Markers No results for input(s): LATICACIDVEN, PROCALCITON, O2SATVEN in the last 168 hours.  ABG No results for input(s): PHART, PCO2ART, PO2ART in the last 168 hours.  Liver Enzymes  Recent Labs Lab 07/22/17 0803 07/22/17 2132 07/23/17 0414 07/24/17 0500  AST 25  --   --   --   ALT 5*  --   --   --   ALKPHOS 135*  --   --   --   BILITOT 1.3*  --   --   --   ALBUMIN 1.9* 1.7* 1.8* 1.7*    Cardiac Enzymes No results for input(s): TROPONINI, PROBNP in the last 168 hours.  Glucose  Recent Labs Lab 07/22/17 2053 07/23/17 0756 07/23/17 1221 07/23/17 1623 07/23/17 2100 07/24/17 0735  GLUCAP 88 92 85 99 111* 101*    Imaging Dg Chest [facility]  1 View  Result Date: 07/24/2017 CLINICAL DATA:  Acute respiratory failure. EXAM: PORTABLE CHEST 1 VIEW COMPARISON:  July 11, 2017 FINDINGS: Stable support apparatus. No pneumothorax. Persistent but decreasing right effusion with underlying atelectasis. Stable cardiomediastinal silhouette. No overt edema. IMPRESSION: Improving effusion on the right.  No other interval change. Electronically Signed   By: Dorise Bullion III M.D   On: 07/24/2017 07:14     IMAGING/STUDIES: CT ABD/PELVIS W/O 8/5: IMPRESSION: 1. Cirrhosis. Moderate ascites. Anasarca. 2. Diverting colostomy without bowel obstruction. Similar thickened rectum compatible with treatment related changes. 3. Mild cardiomegaly. Moderate RIGHT and small LEFT pleural effusions. TTE 8/5:  LV mildly dilated with mild concentric hypertrophy. EF 15-20% with diffuse hypokinesis and apical akinesis. Not sufficient to allow assessment of diastolic function. LA mildly dilated & RA  normal in size. RV normal in size and function. Pacer wire noted within right ventricle. No aortic stenosis with moderate regurgitation. Aortic root normal in size. Mild mitral regurgitation without stenosis. Flattening of the intraventricular septum consistent with right ventricular pressure and volume overload. Mild pulmonic regurgitation without stenosis. Severe tricuspid regurgitation. No pericardial effusion. Study showed no vegetations but image quality was poor and study was technically difficult. CT R HIP W/O 8/6: IMPRESSION: 1. No acute fracture nor dislocation of either hip. No evidence of AVN. Probable right-sided gluteal tendinopathy. 2. The bones are demineralized in appearance. There is generalized muscle atrophy about the right hip. 3. Anasarca with moderate abdominopelvic ascites. 4. Moderate to large bilateral hydroceles. TEE 8/14:  EF 20% with diffuse hypokinesis. Moderate hypokinesis of RV. ICD/pacing wires noted without vegetation. Left atrium dilated without thrombus. Right atrium dilated and pacing wires without vegetation. Mild aortic insufficiency without vegetation. Mild mitral regurgitation without vegetation. Moderate to severe tricuspid regurgitation without vegetation. Pulmonic valve without vegetation. No PFO or ASD. No pericardial effusion.  CXR 8/19> improving pleural effusion. Images reviewed.  MICROBIOLOGY: Blood Cultures x2 7/7:  Negative  MRSA PCR 8/5: Negative  Blood Cultures x2 8/4:  2/2 Bottles MSSA Urine Culture 8/4:  Negative  Blood Cultures x2 8/6:  Negative   ANTIBIOTICS: Vancomycin 8/5 Rifampin 8/6 - 8/7 Ancef 8/6 >>>  SIGNIFICANT EVENTS: 08/04 - Admit 08/05 - Started on CVVHD 08/15 - CVVHD stopped   LINES/TUBES: Foley (out) R IJ TEMP HD CATHETER 8/5 >>> PIV  ASSESSMENT / PLAN:  61 y.o. male with some worsening and hypotension and still requiring vasopressor support. Likely multifactorial shock. Hopeful for renal recovery trying lasix  challenge  PULMONARY A: Acute hypoxic respiratory failure: Resolved post procedure. Pulmonary hypertension: Noted on echocardiogram. Likely multifactorial. History of OSA  P:   Nocturnal CPAP. Refused yesterday OOB, incentive spirometer PT  CARDIOVASCULAR A:  Ischemic cardiomyopathy: Known history of coronary artery disease. Shock: Combination of sepsis and cardiogenic shock. Status post ICD: Currently with bacteremia. Paroxysmal atrial fibrillation  P:  Tele monitoring Cardiology is on board Heparin gtt  RENAL A:   Hyponatremia: M slightly worse today. Acute on chronic renal failure: Unknown prerenal state.  P:   Continue lasix per nephrology Monitor urine output and cr off CVVH. Hopeful for renal recovery Started PO midodine  GASTROINTESTINAL A:   No acute issues. History of rectal cancer: Status post colostomy.  Liver cirrhosis  P:   PO diet  HEMATOLOGIC A:   Anemia: No signs of active bleeding. Hemoglobin stable. Thrombocytopenia Coagulopathy: Multifactorial from sepsis, antibiotics, Coumadin, and liver cirrhosis.  P:  Follow CBC Monitor low platelets. May need to stop heparin if  it continues to fall  INFECTIOUS A:   Sepsis MSSA bacteremia: Hemodialysis catheter placed 8/5. ICD in place. Possible source right leg wound. TEE negative.  P:   Continue abx as above.  ID is recommending pacer removal but may need to stay in per cardiology.   ENDOCRINE A:   Hyperglycemia: Glucose controlled. Multifactorial with steroids.  P:   Lantus, SSI  NEUROLOGIC A:   Arthritis pain: Steadily improving.  P:   PRN norco  ORTHO A: Achilles Tendon Pain Arthritis/Hip Pain  P: No acute interventions  Prophylaxis:  SCDs & heparin gtt Diet:  PO diet Code Status: Full code Disposition: Ok to transfer out of ICU Family Update:  Patient updated during rounds today.  Marshell Garfinkel MD [facility] Pager [phone] If no  answer or after 3pm call: [phone] 07/24/2017, 9:48 AM

## 2017-07-24 NOTE — Progress Notes (Signed)
ANTICOAGULATION CONSULT NOTE - Follow Up Consult  Pharmacy Consult for Heparin  Indication: atrial fibrillation  No Known Allergies  Patient Measurements: Height: 5\' 10"  (177.8 cm) Weight: 188 lb 15 oz (85.7 kg) IBW/kg (Calculated) : 73  Vital Signs: Temp: 98.1 F (36.7 C) (08/19 0357) Temp Source: Oral (08/19 0357) BP: 89/59 (08/19 0500) Pulse Rate: 78 (08/19 0500)  Labs:  Recent Labs  07/22/17 0500 07/22/17 1700  07/22/17 2132 07/23/17 0414 07/23/17 1314 07/24/17 0336 07/24/17 0500  HGB 7.4* 8.9*  --   --  8.9*  --  8.9*  --   HCT 23.6* 27.3*  --   --  27.4*  --  27.8*  --   PLT 167  --   --   --  118*  --  115*  --   LABPROT 19.1*  --   --   --  18.1*  --  17.8*  --   INR 1.58  --   --   --  1.48  --  1.45  --   HEPARINUNFRC 0.22*  --   < >  --  0.31 0.29*  --  0.21*  CREATININE 2.30*  --   --  2.53* 2.66*  --   --  2.84*  < > = values in this interval not displayed.  Estimated Creatinine Clearance: 27.8 mL/min (A) (by C-G formula based on SCr of 2.84 mg/dL (H)).    Assessment: Heparin while warfarin on hold, heparin level is sub-therapeutic this AM, no issue per RN.   Goal of Therapy:  Heparin level 0.3-0.7 units/ml Monitor platelets by anticoagulation protocol: Yes   Plan:  -Inc heparin to 1650 units/hr -1300 HL  Narda Bonds 07/24/2017,6:01 AM

## 2017-07-24 NOTE — Progress Notes (Signed)
Patient ID: SYE AUER, male   DOB: 1956/05/19, 61 y.o.   MRN: [id number]     Advanced Heart Failure Rounding Note  Primary Cardiologist: Isela Stantz  Subjective:    Admitted with staph bacteremia with mixed septic/cardiogenic shock.   TEE 07/19/17 with LVEF 20%. No evidence of endocarditis or vegetation on valves/ICD  CVVD stopped 07/20/17.    Creatinine 1.3 -> 1.7 -> 2.3 -> 2.6 -> 2.8  NE stopped yesterday. On midodrine 5 tid. On lasix 160 q8. U/o 1.1L yesterday Co-ox 76%  Feels better.Denies SOB.    Objective:   Weight Range: 85.7 kg (188 lb 15 oz) Body mass index is 27.11 kg/m.   Vital Signs:   Temp:  [97.5 F (36.4 C)-98.1 F (36.7 C)] 97.7 F (36.5 C) (08/19 0737) Pulse Rate:  [71-78] 77 (08/19 1100) Resp:  [8-20] 11 (08/19 1100) BP: (86-111)/(49-73) 100/57 (08/19 1100) SpO2:  [0 %-100 %] 100 % (08/19 1100) Weight:  [85.7 kg (188 lb 15 oz)] 85.7 kg (188 lb 15 oz) (08/19 0500) Last BM Date:  (colostomy)  Weight change: Filed Weights   07/22/17 0500 07/23/17 0500 07/24/17 0500  Weight: 87.5 kg (192 lb 14.4 oz) 86.8 kg (191 lb 5.8 oz) 85.7 kg (188 lb 15 oz)    Intake/Output:   Intake/Output Summary (Last 24 hours) at 07/24/17 1143 Last data filed at 07/24/17 1100  Gross per 24 hour  Intake          1457.95 ml  Output             1211 ml  Net           246.95 ml      Physical Exam   CVP 12  General: Lying in bed. NAD HEENT: Normal anicteric  Neck: Supple. JVP to jaw RIJ trialysis cath .  No thyromegaly or nodule noted. Cor: PMI laterally displaced RRR 2/6 AS  Lungs: clear. No wheeze  Abdomen: soft NT/ND good BS  + colostomy bag Extremities: No cyanosis, clubbing, or rash. Wounds on LE wrapped. 2+ thigh edema  Neuro: alert & oriented x 3, cranial nerves grossly intact. moves all 4 extremities w/o difficulty. Affect pleasant    Telemetry    BiV pacing 70s, underlying Afib. + PVCs. Personally reviewed   Labs    CBC  Recent Labs  07/23/17 0414 07/24/17 0336  WBC 10.6* 10.2  HGB 8.9* 8.9*  HCT 27.4* 27.8*  MCV 89.3 90.3  PLT 118* 115*   Basic Metabolic Panel  Recent Labs  07/23/17 0414 07/24/17 0336 07/24/17 0500  NA 128*  --  127*  K 4.3  --  4.0  CL 96*  --  95*  CO2 22  --  22  GLUCOSE 80  --  104*  BUN 37*  --  42*  CREATININE 2.66*  --  2.84*  CALCIUM 8.1*  --  8.1*  MG 2.1 1.9  --   PHOS 5.0*  --  5.1*   Liver Function Tests  Recent Labs  07/22/17 0803  07/23/17 0414 07/24/17 0500  AST 25  --   --   --   ALT 5*  --   --   --   ALKPHOS 135*  --   --   --   BILITOT 1.3*  --   --   --   PROT 6.4*  --   --   --   ALBUMIN 1.9*  < > 1.8* 1.7*  < > = values  in this interval not displayed. No results for input(s): LIPASE, AMYLASE in the last 72 hours. Cardiac Enzymes No results for input(s): CKTOTAL, CKMB, CKMBINDEX, TROPONINI in the last 72 hours.  BNP: BNP (last 3 results)  Recent Labs  06/13/17 1205 07/09/17 2151  BNP 1,151.0* 1,553.9*    ProBNP (last 3 results) No results for input(s): PROBNP in the last 8760 hours.   D-Dimer No results for input(s): DDIMER in the last 72 hours. Hemoglobin A1C No results for input(s): HGBA1C in the last 72 hours. Fasting Lipid Panel No results for input(s): CHOL, HDL, LDLCALC, TRIG, CHOLHDL, LDLDIRECT in the last 72 hours. Thyroid Function Tests No results for input(s): TSH, T4TOTAL, T3FREE, THYROIDAB in the last 72 hours.  Invalid input(s): FREET3  Other results:   Imaging   Dg Chest Port 1 View  Result Date: 07/24/2017 CLINICAL DATA:  Acute respiratory failure. EXAM: PORTABLE CHEST 1 VIEW COMPARISON:  July 11, 2017 FINDINGS: Stable support apparatus. No pneumothorax. Persistent but decreasing right effusion with underlying atelectasis. Stable cardiomediastinal silhouette. No overt edema. IMPRESSION: Improving effusion on the right.  No other interval change. Electronically Signed   By: Gerome Sam III M.D   On: 07/24/2017  07:14    Medications:     Scheduled Medications: . Chlorhexidine Gluconate Cloth  6 each Topical Daily  . colchicine  0.3 mg Oral Daily  . darbepoetin (ARANESP) injection - NON-DIALYSIS  60 mcg Subcutaneous Q Wed-1800  . insulin aspart  0-5 Units Subcutaneous QHS  . insulin aspart  0-9 Units Subcutaneous TID WC  . insulin aspart  3 Units Subcutaneous TID WC  . insulin glargine  10 Units Subcutaneous QHS  . midodrine  5 mg Oral TID WC  . sodium chloride flush  10-40 mL Intracatheter Q12H    Infusions: . sodium chloride Stopped (07/23/17 1100)  . sodium chloride 20 mL/hr at 07/21/17 1234  .  ceFAZolin (ANCEF) IV Stopped (07/24/17 5643)  . furosemide Stopped (07/24/17 3295)  . heparin 1,650 Units/hr (07/24/17 0700)  . heparin 999 mL (07/17/17 2143)  . norepinephrine (LEVOPHED) Adult infusion Stopped (07/23/17 0845)  . dialysis replacement fluid (prismasate) 300 mL/hr at 07/20/17 0813  . dialysis replacement fluid (prismasate) 200 mL/hr at 07/20/17 0735  . dialysate (PRISMASATE) 1,000 mL/hr at 07/20/17 1117    PRN Medications: sodium chloride, acetaminophen, alum & mag hydroxide-simeth, fentaNYL (SUBLIMAZE) injection, heparin, heparin, heparin, HYDROcodone-acetaminophen, magic mouthwash w/lidocaine, ondansetron (ZOFRAN) IV, sodium chloride flush    Patient Profile   Mr Turnquist is a 61 year old with a history of DMII, SAH 2016, rectal cancer, CKD Stage IV, chronic A fib, S/P AVN ablation, PAF, CAD BMS LAD 2011 BMS LAD 2009, OSA, chronic systolic heart failure, [employer] Jude BiV ICD admitted with AKI and shock with elevated lactate, suspect cardiogenic.   Assessment/Plan   1. Acute on chronic systolic CHF/cardiogenic shock: Last echo in 8/17 with EF 15-20%.  Ischemic cardiomyopathy, has [employer] Jude BiV ICD (despite chronic afib appears to be effectively BiV pacing).  Had recent admission (discharged 06/21/17) requiring milrinone support and diuresis.  Admitted with soft BP (SBP 80s-90s) +  elevated lactate.  Suspect mixed septic/cardiogenic shock with MSSA bacteremia.  CVVH begun and ongoing.   - CVVHD stopped 07/20/17.  - Now on lasix 160IV q8. Decent urine output. Weight down 3 pounds overnight but still about 13 pounds over previous dry weight of 175. Continue IV lasix.  - Co-ox 76%. Off norepi. On midodrine to support BP.  -No  b-blocker or ACE with decompensation and AKI - Discussed with Dr. Arrie Aran again. Hopefully will have renal recovery. If not, will need to tolerate iHD prior to d/c otherwise only option will be comfort care 2. AKI on CKD stage 4:  - Baseline creatinine around 2.5.  He required brief HD in the past, has RUE fistula. - Started CRRT 8/6. CVVHD stopped 8/15. CVP going up. Urine output improving on lasix - See above for full plan 3. Atrial fibrillation: Chronic - Remains on heparin. Restart warfarin as we have decided to not proceed with ICD extraction D/w pharmD.  4. CAD:  - No CP. Statin on hold for now. Holding ASA.     5. ID:  - Blood CX 2/2 MSSA. Antibiotics narrowed, now on Ancef. 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  - Per Dr. Daiva Eves with ID note, pt needs line holiday and eventual ICD extraction for total cure.  - Not planning ICD extraction for now due to severe comorbidities. He is device dependent. Once IV abx complete will plan suppressive abx. If recovers further can reconsider device extraction. D/w EP - TEE 07/19/17 with no evidence of endocarditis.   - No change.  6. H/O Rectal CA with diverting colostomy  7. Type II diabetes: Sliding scale.  - No change.   8. Liver failure:  - Stable.  9. Anemia of chronic disease. - s/p RBCs on 8/17. Hgb 9.2 on co-ox this am. - Aranesp ordered.  Can go to SDU in am.   Length of Stay: 14  Arvilla Meres, MD  07/24/2017, 11:43 AM  Advanced Heart Failure Team Pager [phone] (M-F; 7a - 4p)  Please contact [facility] for night-coverage after hours (4p -7a ) and weekends  on [email or web address]

## 2017-07-24 NOTE — Progress Notes (Signed)
ANTICOAGULATION/ANTIBIOTIC CONSULT NOTE - Follow Up Consult  Pharmacy Consult for heparin, coumadin, cefazolin Indication: atrial fibrillation  Labs:  Recent Labs  07/22/17 0500 07/22/17 1700  07/22/17 2132 07/23/17 0414 07/23/17 1314 07/24/17 0336 07/24/17 0500 07/24/17 1334  HGB 7.4* 8.9*  --   --  8.9*  --  8.9*  --   --   HCT 23.6* 27.3*  --   --  27.4*  --  27.8*  --   --   PLT 167  --   --   --  118*  --  115*  --   --   LABPROT 19.1*  --   --   --  18.1*  --  17.8*  --   --   INR 1.58  --   --   --  1.48  --  1.45  --   --   HEPARINUNFRC 0.22*  --   < >  --  0.31 0.29*  --  0.21* 0.38  CREATININE 2.30*  --   --  2.53* 2.66*  --   --  2.84*  --   < > = values in this interval not displayed.  Assessment: 60yo male subtherapeutic on heparin with initial dosing while Coumadin on hold.  Heparin level today is 0.38, which is within goal range. No overt bleeding or complications noted.  Pharmacy asked to resume Coumadin today.  CRRT discontinued.  Scr rising slowly now.  Hard to estimate CrCl.  Cefazolin 2 g q 12 hrs should be appropriate for now.  Vanc 8/4 x1 Zosyn 8/4>> 8/5 Cefazolin 8/5 >> Rifampin 8/5 >>8/8 (d/c'd d/t elevated INR and hyperbilirubinemia)   8/4 bld x2: MSSA in 2/2 8/4 urine: NG 8/5 MRSA PCR: neg 8/6 bld x2>ng  Goal of Therapy:  Heparin level 0.3-0.7 units/ml   Plan:  Continue heparin at 1650 units/hr Check heparin level with AM labs Monitor CBC daily  Coumadin 2 mg x1 tonight. Daily INR.  Continue cefazolin 2 g every 12 hours.  Monitor SCr and urine output off CRRT  Uvaldo Rising, BCPS  Clinical Pharmacist Pager [phone]  07/24/2017 2:04 PM

## 2017-07-24 NOTE — Progress Notes (Signed)
Patient ID: [name], male   DOB: [birth year], 61 y.o.   MRN: [id number] S: Feels better O:BP (!) 90/54   Pulse 77   Temp 97.7 F (36.5 C) (Oral)   Resp 10   Ht 5\' 10"  (1.778 m)   Wt 85.7 kg (188 lb 15 oz)   SpO2 100%   BMI 27.11 kg/m   Intake/Output Summary (Last 24 hours) at 07/24/17 1106 Last data filed at 07/24/17 0900  Gross per 24 hour  Intake          1424.95 ml  Output             1061 ml  Net           363.95 ml   Intake/Output: I/O last 3 completed shifts: In: 1924.7 [P.O.:720; I.V.:696.7; IV Piggyback:508] Out: 1751 [Urine:1751]  Intake/Output this shift:  Total I/O In: 273 [P.O.:240; I.V.:33] Out: 160 [Urine:160] Weight change: -1.1 kg (-2 lb 6.8 oz) Gen: NAD CVS: no rub Resp:decreased BS at bases ZSW:FUXNAT Ext: trace edema, RUE AVF+T/B   Recent Labs Lab 07/20/17 1544 07/21/17 0408 07/21/17 1508 07/22/17 0500 07/22/17 0803 07/22/17 2132 07/23/17 0414 07/24/17 0500  NA 132* 131* 130* 128*  --  127* 128* 127*  K 4.6 4.6 4.5 4.7  --  4.4 4.3 4.0  CL 100* 99* 98* 96*  --  97* 96* 95*  CO2 25 25 25 23   --  23 22 22   GLUCOSE 149* 142* 103* 149*  --  79 80 104*  BUN 17 21* 27* 31*  --  36* 37* 42*  CREATININE 1.37* 1.79* 2.02* 2.30*  --  2.53* 2.66* 2.84*  ALBUMIN 2.0* 1.9* 1.8* 1.9* 1.9* 1.7* 1.8* 1.7*  CALCIUM 8.1* 8.1* 8.1* 8.2*  --  8.1* 8.1* 8.1*  PHOS 3.1 3.3 4.0 4.8*  --  4.9* 5.0* 5.1*  AST  --   --   --   --  25  --   --   --   ALT  --   --   --   --  5*  --   --   --    Liver Function Tests:  Recent Labs Lab 07/22/17 0803 07/22/17 2132 07/23/17 0414 07/24/17 0500  AST 25  --   --   --   ALT 5*  --   --   --   ALKPHOS 135*  --   --   --   BILITOT 1.3*  --   --   --   PROT 6.4*  --   --   --   ALBUMIN 1.9* 1.7* 1.8* 1.7*   No results for input(s): LIPASE, AMYLASE in the last 168 hours. No results for input(s): AMMONIA in the last 168 hours. CBC:  Recent Labs Lab 07/20/17 0428 07/21/17 0408 07/22/17 0500 07/22/17 1700  07/23/17 0414 07/24/17 0336  WBC 12.5* 13.8* 13.7*  --  10.6* 10.2  HGB 7.7* 7.6* 7.4* 8.9* 8.9* 8.9*  HCT 25.3* 24.2* 23.6* 27.3* 27.4* 27.8*  MCV 95.8 94.5 92.9  --  89.3 90.3  PLT 136* 126* 167  --  118* 115*   Cardiac Enzymes: No results for input(s): CKTOTAL, CKMB, CKMBINDEX, TROPONINI in the last 168 hours. CBG:  Recent Labs Lab 07/23/17 0756 07/23/17 1221 07/23/17 1623 07/23/17 2100 07/24/17 0735  GLUCAP 92 85 99 111* 101*    Iron Studies: No results for input(s): IRON, TIBC, TRANSFERRIN, FERRITIN in the last 72 hours. Studies/Results: Dg Chest Port 1  View  Result Date: 07/24/2017 CLINICAL DATA:  Acute respiratory failure. EXAM: PORTABLE CHEST 1 VIEW COMPARISON:  July 11, 2017 FINDINGS: Stable support apparatus. No pneumothorax. Persistent but decreasing right effusion with underlying atelectasis. Stable cardiomediastinal silhouette. No overt edema. IMPRESSION: Improving effusion on the right.  No other interval change. Electronically Signed   By: Dorise Bullion III M.D   On: 07/24/2017 07:14   . Chlorhexidine Gluconate Cloth  6 each Topical Daily  . colchicine  0.3 mg Oral Daily  . darbepoetin (ARANESP) injection - NON-DIALYSIS  60 mcg Subcutaneous Q Wed-1800  . insulin aspart  0-5 Units Subcutaneous QHS  . insulin aspart  0-9 Units Subcutaneous TID WC  . insulin aspart  3 Units Subcutaneous TID WC  . insulin glargine  10 Units Subcutaneous QHS  . midodrine  5 mg Oral TID WC  . sodium chloride flush  10-40 mL Intracatheter Q12H    BMET    Component Value Date/Time   NA 127 (L) 07/24/2017 0500   K 4.0 07/24/2017 0500   CL 95 (L) 07/24/2017 0500   CO2 22 07/24/2017 0500   GLUCOSE 104 (H) 07/24/2017 0500   BUN 42 (H) 07/24/2017 0500   CREATININE 2.84 (H) 07/24/2017 0500   CREATININE 2.67 (H) 03/18/2016 0917   CALCIUM 8.1 (L) 07/24/2017 0500   CALCIUM 7.9 (L) 07/12/2017 0723   GFRNONAA 22 (L) 07/24/2017 0500   GFRAA 26 (L) 07/24/2017 0500   CBC     Component Value Date/Time   WBC 10.2 07/24/2017 0336   RBC 3.08 (L) 07/24/2017 0336   HGB 8.9 (L) 07/24/2017 0336   HGB 14.6 05/02/2012 0852   HCT 27.8 (L) 07/24/2017 0336   HCT 23.6 (L) 04/24/2015 1116   HCT 43.6 05/02/2012 0852   PLT 115 (L) 07/24/2017 0336   PLT 163 05/02/2012 0852   MCV 90.3 07/24/2017 0336   MCV 93.3 05/02/2012 0852   MCH 28.9 07/24/2017 0336   MCHC 32.0 07/24/2017 0336   RDW 16.9 (H) 07/24/2017 0336   RDW 14.1 05/02/2012 0852   LYMPHSABS 0.3 (L) 07/12/2017 0424   LYMPHSABS 1.2 05/02/2012 0852   MONOABS 1.2 (H) 07/12/2017 0424   MONOABS 0.6 05/02/2012 0852   EOSABS 0.3 07/12/2017 0424   EOSABS 0.0 05/02/2012 0852   BASOSABS 0.0 07/12/2017 0424   BASOSABS 0.0 05/02/2012 0852    A/P  1. AKI/CKD 4- in setting of MSSA bacteremia and ischemic cardiomyopathy/hypotension/volume overload. Admitted 07/09/17 with respiratory failuer and demand ischemia improved with initiation of CVVHD. Off of CVVHD since 07/20/17 and required increasing doses of IV lasix.   1. Currently off of pressors 2. Started po midodrine 5mg  tid and follow UOP and I's/O's 3. Rise of Scr has slowed and had 650cc of urine over last 24 hours. 4. continue to hold CVVHD for now and hopefully can avoid further dialysis. 2. Acute on chronic CHF/ischemic CMP 3. MSSA bacteremia- unclear etiology. On abx and TEE negative for SBE. 4. Anemia of chronic kidney disease- transfuse prn and continue ESA 5. Hyponatremia- due to CHF/anasarca improved with volume removal 6. Cirrhosis- elevated t bili and abnormal lft's off of statin and improved 7. Thrombocytopenia- may need to stop heparin, continue to follow. 8. Atrial fib- per cards  Donetta Potts, MD [facility] [phone]

## 2017-07-25 DIAGNOSIS — Z7901 Long term (current) use of anticoagulants: Secondary | ICD-10-CM

## 2017-07-25 LAB — HEPARIN LEVEL (UNFRACTIONATED): HEPARIN UNFRACTIONATED: 0.48 [IU]/mL (ref 0.30–0.70)

## 2017-07-25 LAB — IRON AND TIBC
Iron: 22 ug/dL — ABNORMAL LOW (ref 45–182)
Saturation Ratios: 10 % — ABNORMAL LOW (ref 17.9–39.5)
TIBC: 225 ug/dL — ABNORMAL LOW (ref 250–450)
UIBC: 203 ug/dL

## 2017-07-25 LAB — GLUCOSE, CAPILLARY
GLUCOSE-CAPILLARY: 109 mg/dL — AB (ref 65–99)
GLUCOSE-CAPILLARY: 110 mg/dL — AB (ref 65–99)
GLUCOSE-CAPILLARY: 81 mg/dL (ref 65–99)
GLUCOSE-CAPILLARY: 107 mg/dL — AB (ref 65–99)

## 2017-07-25 LAB — COOXEMETRY PANEL
Carboxyhemoglobin: 1.8 % — ABNORMAL HIGH (ref 0.5–1.5)
METHEMOGLOBIN: 1.4 % (ref 0.0–1.5)
O2 Saturation: 68 %
TOTAL HEMOGLOBIN: 9.2 g/dL — AB (ref 12.0–16.0)

## 2017-07-25 LAB — CBC
HEMATOCRIT: 27.9 % — AB (ref 39.0–52.0)
HEMOGLOBIN: 9 g/dL — AB (ref 13.0–17.0)
MCH: 29.4 pg (ref 26.0–34.0)
MCHC: 32.3 g/dL (ref 30.0–36.0)
MCV: 91.2 fL (ref 78.0–100.0)
Platelets: 124 10*3/uL — ABNORMAL LOW (ref 150–400)
RBC: 3.06 MIL/uL — AB (ref 4.22–5.81)
RDW: 17.2 % — ABNORMAL HIGH (ref 11.5–15.5)
WBC: 8.6 10*3/uL (ref 4.0–10.5)

## 2017-07-25 LAB — BASIC METABOLIC PANEL
Anion gap: 10 (ref 5–15)
BUN: 48 mg/dL — ABNORMAL HIGH (ref 6–20)
CHLORIDE: 96 mmol/L — AB (ref 101–111)
CO2: 22 mmol/L (ref 22–32)
CREATININE: 3.13 mg/dL — AB (ref 0.61–1.24)
Calcium: 7.9 mg/dL — ABNORMAL LOW (ref 8.9–10.3)
GFR calc non Af Amer: 20 mL/min — ABNORMAL LOW (ref >60)
GFR, EST AFRICAN AMERICAN: 23 mL/min — AB (ref >60)
Glucose, Bld: 127 mg/dL — ABNORMAL HIGH (ref 65–99)
POTASSIUM: 3.7 mmol/L (ref 3.5–5.1)
SODIUM: 128 mmol/L — AB (ref 135–145)

## 2017-07-25 LAB — PROTIME-INR
INR: 1.55
PROTHROMBIN TIME: 18.8 s — AB (ref 11.4–15.2)

## 2017-07-25 LAB — PHOSPHORUS: PHOSPHORUS: 4.9 mg/dL — AB (ref 2.5–4.6)

## 2017-07-25 LAB — ALBUMIN: ALBUMIN: 1.7 g/dL — AB (ref 3.5–5.0)

## 2017-07-25 LAB — MAGNESIUM: MAGNESIUM: 1.9 mg/dL (ref 1.7–2.4)

## 2017-07-25 MED ORDER — WARFARIN SODIUM 2 MG PO TABS
2.0000 mg | ORAL_TABLET | Freq: Once | ORAL | Status: AC
  Administered 2017-07-25: 2 mg via ORAL
  Filled 2017-07-25: qty 1

## 2017-07-25 MED ORDER — DARBEPOETIN ALFA 200 MCG/0.4ML IJ SOSY
200.0000 ug | PREFILLED_SYRINGE | INTRAMUSCULAR | Status: DC
  Administered 2017-07-27: 200 ug via SUBCUTANEOUS
  Filled 2017-07-25: qty 0.4

## 2017-07-25 NOTE — Progress Notes (Signed)
Subjective: Interval History: has no complaint , wants to get up.  Objective: Vital signs in last 24 hours: Temp:  [97.3 F (36.3 C)-98.3 F (36.8 C)] 98.3 F (36.8 C) (08/20 0837) Pulse Rate:  [70-78] 72 (08/20 0837) Resp:  [8-19] 19 (08/20 0837) BP: (80-109)/(50-73) 101/57 (08/20 0837) SpO2:  [98 %-100 %] 100 % (08/20 0837) Weight:  [86.4 kg (190 lb 7.6 oz)-89 kg (196 lb 3.4 oz)] 86.4 kg (190 lb 7.6 oz) (08/20 0321) Weight change: 3.3 kg (7 lb 4.4 oz)  Intake/Output from previous day: 08/19 0701 - 08/20 0700 In: 956 [P.O.:240; I.V.:384; IV Piggyback:332] Out: 1110 [Urine:1110] Intake/Output this shift: Total I/O In: -  Out: 250 [Urine:250]  General appearance: alert, cooperative, no distress and pale Neck: IJ cath Resp: diminished breath sounds bibasilar and rales bibasilar Cardio: irregularly irregular rhythm, S1, S2 normal and systolic murmur: holosystolic 2/6, blowing at apex GI: distended, liver down 6 cm Extremities: edema 4+  Lab Results:  Recent Labs  07/24/17 0336 07/25/17 0351  WBC 10.2 8.6  HGB 8.9* 9.0*  HCT 27.8* 27.9*  PLT 115* 124*   BMET:  Recent Labs  07/24/17 0500 07/25/17 0351  NA 127* 128*  K 4.0 3.7  CL 95* 96*  CO2 22 22  GLUCOSE 104* 127*  BUN 42* 48*  CREATININE 2.84* 3.13*  CALCIUM 8.1* 7.9*   No results for input(s): PTH in the last 72 hours. Iron Studies: No results for input(s): IRON, TIBC, TRANSFERRIN, FERRITIN in the last 72 hours.  Studies/Results: Dg Chest Port 1 View  Result Date: 07/24/2017 CLINICAL DATA:  Acute respiratory failure. EXAM: PORTABLE CHEST 1 VIEW COMPARISON:  July 11, 2017 FINDINGS: Stable support apparatus. No pneumothorax. Persistent but decreasing right effusion with underlying atelectasis. Stable cardiomediastinal silhouette. No overt edema. IMPRESSION: Improving effusion on the right.  No other interval change. Electronically Signed   By: Dorise Bullion III M.D   On: 07/24/2017 07:14    I have  reviewed the patient's current medications.  Assessment/Plan: 1 CKD4/AKI making urine but not much.  Slow diuresis.  Vol xs , rising Cr.  Will see if can get by without HD but skeptical 2 ICD 3 MSSA sepsis on Ancef 4 Anemia 5 CM 6 Afib P Lasix, mobilize, follow Cr, Ancef    LOS: 15 days   Lyly Canizales L 07/25/2017,9:05 AM

## 2017-07-25 NOTE — Progress Notes (Signed)
PROGRESS NOTE    [name]  WCB:[id number] DOB: 1956/03/07 DOA: 07/09/2017 PCP: Redmond School, MD    Brief Narrative: 61 y.o. male with history of ischemic cardiomyopathy status post ICD placement. Also history of chronic renal failure. Admitted on 8/4 with acute respiratory failure secondary to pulmonary edema. Subsequently found to have a MSSA bacteremia. Patient has had persistent shock requiring vasopressor support and is on renal replacement therapy. He was initially admitted to [facility] and transferred to [facility] service on 8/20.   Assessment & Plan:   Principal Problem:   Septic shock ([place]) Active Problems:   Ischemic cardiomyopathy-EF 35% 04/24/15 echo   Long term current use of anticoagulant therapy   CAD with LCX disease and stents to LAD   Chronic a-fib (HCC)   Chronic kidney disease, stage IV (severe) (HCC)   OSA on CPAP   Hyperlipidemia   BiV ICD (St Jude) gen change 04/10/15   Chronic atrial fibrillation (HCC)   Right heart failure (HCC)   Cardiorenal syndrome with renal failure   Infected defibrillator (HCC)   Septic hip (HCC)   MSSA (methicillin susceptible Staphylococcus aureus) infection   Staphylococcus aureus bacteremia with sepsis (HCC)   Severe sepsis with septic shock ([place])   MSSA bacteremia and septic shock:  Required vaso pressors support, off pressors now . TEE negative for endocarditis.  Currently on ANCEF.  Dr Azir Medal with ID recommended he will need a holiday from his HD catheter and central lines with repeat blood cultures after removal of lines and ultimately removal of ICD but pt is ICD dependent, cannot remove the ICD, due to severe co morbidities.  Hence 6 weeks of IV cefazolin therapy and followed by chronic suppressive cephalexin if he continues with his ICD, as per ID recommendations.   Ischemic cardiomyopathy/ cardiogenic shock/ acute on chronic systolic heart failure. Marland Kitchen  LVEF of 20%, no vegetations or endocarditis on TEE. :  S/p ICD,     Cardiology on board and diuresis with lasix gtt.  Monitor renal parameters on IV lasix.  Repeat CXR on 8/19 show improving right pleural effusions. Not much diuresis yesterday.    Acute on CKD stage 4:  Cardiorenal syndrome.  Renal on board, had brief HD in the past.  Has RUE fistula.  On lasix gtt.  His creatinine worsening gradually and will probably need HD later on if it continues to trend upwards.   Atrial fibrillation:  Rate controlled.  On coumadin and heparin gtt.  Subtherapeutic INR.   Anemia of chronic disease from CKD:  S/p Rbc transfusion on 8/17, hgb stable around 9 .  aranesp on board.    Type 2 DM:  CBG (last 3)   Recent Labs  07/24/17 1627 07/24/17 2148 07/25/17 0835  GLUCAP 119* 148* 109*    Resume SSI.    H/O Rectal Cancer with diverting colostomy.  No new complaints.   Hyponatremia: possibly from renal failure.   DVT prophylaxis: heparin gtt.  Code Status: full code.  Family Communication: none at bedside.  Disposition Plan pending further work up by renal.    Consultants:   Cardiology  Nephrology.    Procedures:    TEE 8/14:  EF 20% with diffuse hypokinesis. Moderate hypokinesis of RV. ICD/pacing wires noted without vegetation. Left atrium dilated without thrombus. Right atrium dilated and pacing wires without vegetation. Mild aortic insufficiency without vegetation. Mild mitral regurgitation without vegetation. Moderate to severe tricuspid regurgitation without vegetation. Pulmonic valve without vegetation. No PFO or ASD.  No pericardial effusion.   Antimicrobials: vancomycin on 8/5  Ancef from 8/6   SIGNIFICANT EVENTS: 08/04 - Admit 08/05 - Started on CVVHD 08/15 - CVVHD stopped  8/20 - transfer to [facility] service.    Subjective: His dyspnea is the same as yesterday. No new complaints.   Objective: Vitals:   07/24/17 1953 07/24/17 2313 07/25/17 0321 07/25/17 0837  BP: (!) 100/52 (!) 86/54 (!) 109/50 (!) 101/57  Pulse: 71  70 78 72  Resp:  11 14 19   Temp: 97.6 F (36.4 C) 97.7 F (36.5 C) 97.6 F (36.4 C) 98.3 F (36.8 C)  TempSrc: Oral Oral Oral Oral  SpO2: 99% 100% 100% 100%  Weight:   86.4 kg (190 lb 7.6 oz)   Height:        Intake/Output Summary (Last 24 hours) at 07/25/17 0932 Last data filed at 07/25/17 0847  Gross per 24 hour  Intake              683 ml  Output             1200 ml  Net             -517 ml   Filed Weights   07/24/17 0500 07/24/17 1706 07/25/17 0321  Weight: 85.7 kg (188 lb 15 oz) 89 kg (196 lb 3.4 oz) 86.4 kg (190 lb 7.6 oz)    Examination:  General exam: Appears calm and comfortable sitting up in the chair.  Respiratory system: air entry fair, no wheezing orr honchi,  Cardiovascular system: S1 & S2 heard, RRR. Murmer present. 3+ leg edema .  Gastrointestinal system: Abdomen is nondistended, soft and nontender. No organomegaly or masses felt. Normal bowel sounds heard. Central nervous system: Alert and oriented. No focal neurological deficits. Extremities: Symmetric 5 x 5 power. Right lower extremity wrapped in  Bandage.  Skin: No rashes, lesions or ulcers Psychiatry: Judgement and insight appear normal. Mood & affect appropriate.     Data Reviewed: I have personally reviewed following labs and imaging studies  CBC:  Recent Labs Lab 07/21/17 0408 07/22/17 0500 07/22/17 1700 07/23/17 0414 07/24/17 0336 07/25/17 0351  WBC 13.8* 13.7*  --  10.6* 10.2 8.6  HGB 7.6* 7.4* 8.9* 8.9* 8.9* 9.0*  HCT 24.2* 23.6* 27.3* 27.4* 27.8* 27.9*  MCV 94.5 92.9  --  89.3 90.3 91.2  PLT 126* 167  --  118* 115* 509*   Basic Metabolic Panel:  Recent Labs Lab 07/21/17 0408  07/22/17 0500 07/22/17 2132 07/23/17 0414 07/24/17 0336 07/24/17 0500 07/25/17 0351  NA 131*  < > 128* 127* 128*  --  127* 128*  K 4.6  < > 4.7 4.4 4.3  --  4.0 3.7  CL 99*  < > 96* 97* 96*  --  95* 96*  CO2 25  < > 23 23 22   --  22 22  GLUCOSE 142*  < > 149* 79 80  --  104* 127*  BUN 21*  < >  31* 36* 37*  --  42* 48*  CREATININE 1.79*  < > 2.30* 2.53* 2.66*  --  2.84* 3.13*  CALCIUM 8.1*  < > 8.2* 8.1* 8.1*  --  8.1* 7.9*  MG 2.2  --  2.2  --  2.1 1.9  --  1.9  PHOS 3.3  < > 4.8* 4.9* 5.0*  --  5.1* 4.9*  < > = values in this interval not displayed. GFR: Estimated Creatinine Clearance: 25.3 mL/min (A) (by C-G  formula based on SCr of 3.13 mg/dL (H)). Liver Function Tests:  Recent Labs Lab 07/22/17 0803 07/22/17 2132 07/23/17 0414 07/24/17 0500 07/25/17 0351  AST 25  --   --   --   --   ALT 5*  --   --   --   --   ALKPHOS 135*  --   --   --   --   BILITOT 1.3*  --   --   --   --   PROT 6.4*  --   --   --   --   ALBUMIN 1.9* 1.7* 1.8* 1.7* 1.7*   No results for input(s): LIPASE, AMYLASE in the last 168 hours. No results for input(s): AMMONIA in the last 168 hours. Coagulation Profile:  Recent Labs Lab 07/21/17 0408 07/22/17 0500 07/23/17 0414 07/24/17 0336 07/25/17 0351  INR 1.73 1.58 1.48 1.45 1.55   Cardiac Enzymes: No results for input(s): CKTOTAL, CKMB, CKMBINDEX, TROPONINI in the last 168 hours. BNP (last 3 results) No results for input(s): PROBNP in the last 8760 hours. HbA1C: No results for input(s): HGBA1C in the last 72 hours. CBG:  Recent Labs Lab 07/24/17 0735 07/24/17 1159 07/24/17 1627 07/24/17 2148 07/25/17 0835  GLUCAP 101* 111* 119* 148* 109*   Lipid Profile: No results for input(s): CHOL, HDL, LDLCALC, TRIG, CHOLHDL, LDLDIRECT in the last 72 hours. Thyroid Function Tests: No results for input(s): TSH, T4TOTAL, FREET4, T3FREE, THYROIDAB in the last 72 hours. Anemia Panel: No results for input(s): VITAMINB12, FOLATE, FERRITIN, TIBC, IRON, RETICCTPCT in the last 72 hours. Sepsis Labs: No results for input(s): PROCALCITON, LATICACIDVEN in the last 168 hours.  No results found for this or any previous visit (from the past 240 hour(s)).       Radiology Studies: Dg Chest Port 1 View  Result Date: 07/24/2017 CLINICAL DATA:   Acute respiratory failure. EXAM: PORTABLE CHEST 1 VIEW COMPARISON:  July 11, 2017 FINDINGS: Stable support apparatus. No pneumothorax. Persistent but decreasing right effusion with underlying atelectasis. Stable cardiomediastinal silhouette. No overt edema. IMPRESSION: Improving effusion on the right.  No other interval change. Electronically Signed   By: Dorise Bullion III M.D   On: 07/24/2017 07:14        Scheduled Meds: . Chlorhexidine Gluconate Cloth  6 each Topical Daily  . colchicine  0.3 mg Oral Daily  . [START ON 07/27/2017] darbepoetin (ARANESP) injection - NON-DIALYSIS  200 mcg Subcutaneous Q Wed-1800  . insulin aspart  0-5 Units Subcutaneous QHS  . insulin aspart  0-9 Units Subcutaneous TID WC  . insulin aspart  3 Units Subcutaneous TID WC  . insulin glargine  10 Units Subcutaneous QHS  . midodrine  5 mg Oral TID WC  . sodium chloride flush  10-40 mL Intracatheter Q12H  . Warfarin - Pharmacist Dosing Inpatient   Does not apply q1800   Continuous Infusions: . sodium chloride 250 mL (07/24/17 1900)  .  ceFAZolin (ANCEF) IV Stopped (07/25/17 0559)  . furosemide Stopped (07/24/17 2237)  . heparin 1,650 Units/hr (07/25/17 0527)  . heparin 999 mL (07/17/17 2143)  . dialysis replacement fluid (prismasate) 300 mL/hr at 07/20/17 0813  . dialysis replacement fluid (prismasate) 200 mL/hr at 07/20/17 0735  . dialysate (PRISMASATE) 1,000 mL/hr at 07/20/17 1117     LOS: 15 days    Time spent: 45 minutes.     Hosie Poisson, MD [facility] Pager [phone]  If 7PM-7AM, please contact night-coverage [email or web address] Password [facility] 07/25/2017, 9:32 AM

## 2017-07-25 NOTE — Progress Notes (Signed)
Occupational Therapy Treatment Patient Details Name: [name] MRN: [id number] DOB: [birth year] Today's Date: 07/25/2017    History of present illness 61 yr old male with extensive PMHx MI, ICM, HFrEF EF15%-20% ,BiVICD, CKD Stage IV, DM, GERD, OSA on CPAP, PAF, presents with weight gain, lower extremity swelling and decreased urine output within the last week.   Work up included AOCsHF/cardiogenic shock, BiV MSSA, demand ischemia.  Pt continues on CRRT.   OT comments  Pt requesting to transfer back to bed - has been in recliner since am.  He required max A +2 for lateral scoot transfers as he was unable to tolerate weight bearing on Rt LE due to severe pain 8-10/10.  He continues to be very motivated to improve, but pain limits progress.  Will follow.   Follow Up Recommendations  CIR;Supervision/Assistance - 24 hour    Equipment Recommendations  3 in 1 bedside commode;Tub/shower bench    Recommendations for Other Services Rehab consult    Precautions / Restrictions Precautions Precautions: Fall Precaution Comments: Watch BP       Mobility Bed Mobility Overal bed mobility: Needs Assistance Bed Mobility: Sit to Supine       Sit to supine: Max assist;+2 for physical assistance   General bed mobility comments: assist to lower trunk and to lift LEs onto bed   Transfers Overall transfer level: Needs assistance Equipment used: 2 person hand held assist Transfers: Lateral/Scoot Transfers          Lateral/Scoot Transfers: Max assist;+2 physical assistance General transfer comment: Pt reports signficant Rt hip pain, unable to stand (has been sitting in recliner since this am, and requesting to transfer back to bed.  Attempted squat pivot transfer to Rt, however, pt unable to flex hips adequately to shift weight anteriorly for safe transfer due to pain - able to achieve x 1, but unable to shift weight to the Rt.  Performed scoot transfer using bed pads.  he was able to assist  minimally     Balance Overall balance assessment: Needs assistance Sitting-balance support: Feet supported;No upper extremity supported Sitting balance-Leahy Scale: Fair Sitting balance - Comments: able to maintain static sitting EOB with min guard assist                                    ADL either performed or assessed with clinical judgement   ADL Overall ADL's : Needs assistance/impaired                                             Vision       Perception     Praxis      Cognition Arousal/Alertness: Awake/alert Behavior During Therapy: WFL for tasks assessed/performed Overall Cognitive Status: Within Functional Limits for tasks assessed                                          Exercises     Shoulder Instructions       General Comments VSS    Pertinent Vitals/ Pain       Pain Assessment: Faces Faces Pain Scale: Hurts whole lot Pain Location: R hip with mobility/weightbearing Pain Descriptors / Indicators: Aching;Grimacing;Guarding Pain Intervention(s):  Limited activity within patient's tolerance;Monitored during session;Repositioned;RN gave pain meds during session  Home Living                                          Prior Functioning/Environment              Frequency  Min 2X/week        Progress Toward Goals  OT Goals(current goals can now be found in the care plan section)  Progress towards OT goals: Progressing toward goals (slowly )     Plan Discharge plan remains appropriate    Co-evaluation                 AM-PAC PT "6 Clicks" Daily Activity     Outcome Measure   Help from another person eating meals?: A Little Help from another person taking care of personal grooming?: A Little Help from another person toileting, which includes using toliet, bedpan, or urinal?: Total Help from another person bathing (including washing, rinsing, drying)?: A Lot Help  from another person to put on and taking off regular upper body clothing?: Total Help from another person to put on and taking off regular lower body clothing?: Total 6 Click Score: 11    End of Session    OT Visit Diagnosis: Unsteadiness on feet (R26.81);Muscle weakness (generalized) (M62.81)   Activity Tolerance Patient limited by pain   Patient Left in bed;with call bell/phone within reach;with nursing/sitter in room   Nurse Communication Mobility status        Time: 0300-9233 OT Time Calculation (min): 21 min  Charges: OT General Charges $OT Visit: 1 Procedure OT Treatments $Therapeutic Activity: 8-22 mins  [facility], OTR/L [phone]    Lucille Passy M 07/25/2017, 4:39 PM

## 2017-07-25 NOTE — Progress Notes (Signed)
Patient ID: [name], male   DOB: 1956/10/20, 61 y.o.   MRN: [id number]     Advanced Heart Failure Rounding Note  Primary Cardiologist: Bensimhon  Subjective:    Admitted with staph bacteremia with mixed septic/cardiogenic shock.   TEE 07/19/17 with LVEF 20%. No evidence of endocarditis or vegetation on valves/ICD  CVVD stopped 07/20/17.    Creatinine 1.3 -> 1.7 -> 2.3 -> 2.6 -> 2.8->3.13  Remains on IV Lasix 160 mg TID, weight down 6 pounds overnight, says he urinated 3 times overnight (only 250 ml of urine charted). CVP 11  Objective:   Weight Range: 190 lb 7.6 oz (86.4 kg) Body mass index is 27.33 kg/m.   Vital Signs:   Temp:  [97.3 F (36.3 C)-98.3 F (36.8 C)] 98.3 F (36.8 C) (08/20 0837) Pulse Rate:  [70-78] 72 (08/20 0837) Resp:  [9-19] 19 (08/20 0837) BP: (80-109)/(50-59) 101/57 (08/20 0837) SpO2:  [98 %-100 %] 100 % (08/20 0837) Weight:  [190 lb 7.6 oz (86.4 kg)-196 lb 3.4 oz (89 kg)] 190 lb 7.6 oz (86.4 kg) (08/20 0321) Last BM Date: 07/24/17  Weight change: Filed Weights   07/24/17 0500 07/24/17 1706 07/25/17 0321  Weight: 188 lb 15 oz (85.7 kg) 196 lb 3.4 oz (89 kg) 190 lb 7.6 oz (86.4 kg)    Intake/Output:   Intake/Output Summary (Last 24 hours) at 07/25/17 1113 Last data filed at 07/25/17 0847  Gross per 24 hour  Intake              650 ml  Output             1050 ml  Net             -400 ml      Physical Exam   CVP 11  General:Chronically ill appearing male, NAD.  HEENT: Normal  Neck: Supple, JVP to jaw. R IJ trialysis cath. No thyromegaly or nodule noted. Cor: PMI laterally displaced, Regular rate and rhythm. 2/6 AS murmur.  Lungs: Clear bilaterally.  Abdomen: Soft, non tender, non distended. Bowel sounds present. Colostomy bag present.  Extremities: No cyanosis, clubbing, or rash. LLE wound wrapped, intact. 2+ edema to thighs bilaterally.  Neuro: Alert and oriented. cranial nerves grossly intact. moves all 4 extremities w/o  difficulty. Affect pleasant    Telemetry   BiV pacing rate in the 70's with underlying afib - personally reviewed.    Labs    CBC  Recent Labs  07/24/17 0336 07/25/17 0351  WBC 10.2 8.6  HGB 8.9* 9.0*  HCT 27.8* 27.9*  MCV 90.3 91.2  PLT 115* 751*   Basic Metabolic Panel  Recent Labs  07/24/17 0336 07/24/17 0500 07/25/17 0351  NA  --  127* 128*  K  --  4.0 3.7  CL  --  95* 96*  CO2  --  22 22  GLUCOSE  --  104* 127*  BUN  --  42* 48*  CREATININE  --  2.84* 3.13*  CALCIUM  --  8.1* 7.9*  MG 1.9  --  1.9  PHOS  --  5.1* 4.9*   Liver Function Tests  Recent Labs  07/24/17 0500 07/25/17 0351  ALBUMIN 1.7* 1.7*   No results for input(s): LIPASE, AMYLASE in the last 72 hours. Cardiac Enzymes No results for input(s): CKTOTAL, CKMB, CKMBINDEX, TROPONINI in the last 72 hours.  BNP: BNP (last 3 results)  Recent Labs  06/13/17 1205 07/09/17 2151  BNP 1,151.0* 1,553.9*  ProBNP (last 3 results) No results for input(s): PROBNP in the last 8760 hours.   D-Dimer No results for input(s): DDIMER in the last 72 hours. Hemoglobin A1C No results for input(s): HGBA1C in the last 72 hours. Fasting Lipid Panel No results for input(s): CHOL, HDL, LDLCALC, TRIG, CHOLHDL, LDLDIRECT in the last 72 hours. Thyroid Function Tests No results for input(s): TSH, T4TOTAL, T3FREE, THYROIDAB in the last 72 hours.  Invalid input(s): FREET3  Other results:   Imaging   No results found.  Medications:     Scheduled Medications: . Chlorhexidine Gluconate Cloth  6 each Topical Daily  . colchicine  0.3 mg Oral Daily  . [START ON 07/27/2017] darbepoetin (ARANESP) injection - NON-DIALYSIS  200 mcg Subcutaneous Q Wed-1800  . insulin aspart  0-5 Units Subcutaneous QHS  . insulin aspart  0-9 Units Subcutaneous TID WC  . insulin aspart  3 Units Subcutaneous TID WC  . insulin glargine  10 Units Subcutaneous QHS  . midodrine  5 mg Oral TID WC  . sodium chloride flush   10-40 mL Intracatheter Q12H  . Warfarin - Pharmacist Dosing Inpatient   Does not apply q1800    Infusions: . sodium chloride 250 mL (07/24/17 1900)  .  ceFAZolin (ANCEF) IV Stopped (07/25/17 0559)  . furosemide Stopped (07/24/17 2237)  . heparin 1,650 Units/hr (07/25/17 0527)  . heparin 999 mL (07/17/17 2143)  . dialysis replacement fluid (prismasate) 300 mL/hr at 07/20/17 0813  . dialysis replacement fluid (prismasate) 200 mL/hr at 07/20/17 0735  . dialysate (PRISMASATE) 1,000 mL/hr at 07/20/17 1117    PRN Medications: sodium chloride, acetaminophen, alum & mag hydroxide-simeth, fentaNYL (SUBLIMAZE) injection, heparin, heparin, heparin, HYDROcodone-acetaminophen, magic mouthwash w/lidocaine, ondansetron (ZOFRAN) IV, sodium chloride flush    Patient Profile   [name] is a 61 year old with a history of DMII, [place] 2016, rectal cancer, CKD Stage IV, chronic A fib, S/P AVN ablation, PAF, CAD BMS LAD 2011 BMS LAD 2009, OSA, chronic systolic heart failure, St Jude BiV ICD admitted with AKI and shock with elevated lactate, suspect cardiogenic.   Assessment/Plan   1. Acute on chronic systolic CHF/cardiogenic shock: Last echo in 8/17 with EF 15-20%.  Ischemic cardiomyopathy, has St Jude BiV ICD (despite chronic afib appears to be effectively BiV pacing).  Had recent admission (discharged 06/21/17) requiring milrinone support and diuresis.  Admitted with soft BP (SBP 80s-90s) + elevated lactate.  Suspect mixed septic/cardiogenic shock with MSSA bacteremia.  - CVVHD stopped 07/20/17.  - Urine output sluggish with 160 mg IV lasix TID. Co ox 68%.  - No beta blocker with decompensation, hypotension.  - No ARB with AKI.  - He will likely need iHD for volume removal.   2. AKI on CKD stage 4:  - Baseline creatinine 2.5 - Renal following.    3. Atrial fibrillation: Chronic - Restarted on warfarin, no plans to extract device.  - INR 1.55, HF PharmD following.   4. CAD:  - Denies chest pain.    - Continue ASA. No statin with recent elevated LFT's    5. ID:  - Blood CX 2/2 MSSA. Antibiotics narrowed, now on Ancef. 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   - Not planning ICD extraction for now due to severe comorbidities. He is device dependent. Once IV abx complete will plan suppressive abx. If recovers further can reconsider device extraction. D/w EP - TEE 07/19/17 with no evidence of endocarditis.   - No change.   6.  H/O Rectal CA with diverting colostomy   7. Type II diabetes:  - Continue sliding scale.   8. Liver failure:  - Stable, will repeat LFT's in the am.   9. Anemia of chronic disease. - s/p RBCs on 8/17.  - Hgb 9, stable.  - Aranesp ordered.    Length of Stay: [place], NP  07/25/2017, 11:13 AM  Advanced Heart Failure Team Pager [phone] (M-F; [place])  Please contact [facility] for night-coverage after hours (4p -7a ) and weekends on [email or web address]  Patient seen and examined with Jettie Booze, NP. We discussed all aspects of the encounter. I agree with the assessment and plan as stated above.   Remains tenuous. Volume status relatively stable on high-dose IV lasix. Creatinine up slightly. BP stable on midodrine. Off norepi. Hopefully can get him over to po diuretics soon and see how he does.   Continue abx. Appreciate ID recs.   PT to see.   CAD is stable without angina.   Glori Bickers, MD  12:43 PM

## 2017-07-25 NOTE — Progress Notes (Signed)
ANTICOAGULATION CONSULT NOTE - Follow Up Consult  Pharmacy Consult for Heparin > warfarin Indication: atrial fibrillation  No Known Allergies  Patient Measurements: Height: 5\' 10"  (177.8 cm) Weight: 190 lb 7.6 oz (86.4 kg) IBW/kg (Calculated) : 73  Vital Signs: Temp: 98.3 F (36.8 C) (08/20 0837) Temp Source: Oral (08/20 0837) BP: 101/57 (08/20 0837) Pulse Rate: 72 (08/20 0837)  Labs:  Recent Labs  07/23/17 0414  07/24/17 0336 07/24/17 0500 07/24/17 1334 07/25/17 0351  HGB 8.9*  --  8.9*  --   --  9.0*  HCT 27.4*  --  27.8*  --   --  27.9*  PLT 118*  --  115*  --   --  124*  LABPROT 18.1*  --  17.8*  --   --  18.8*  INR 1.48  --  1.45  --   --  1.55  HEPARINUNFRC 0.31  < >  --  0.21* 0.38 0.48  CREATININE 2.66*  --   --  2.84*  --  3.13*  < > = values in this interval not displayed.  Estimated Creatinine Clearance: 25.3 mL/min (A) (by C-G formula based on SCr of 3.13 mg/dL (H)).   Assessment: 61yo male on heparin while Coumadin on hold. Heparin dosing weight is 90.7 kg.   Heparin level today 0.48 at  goal range heparin drip rate 1650 uts/hr.  No bleeding or complications noted.  CBC stable.  INR 1.55 after restarting warfarin last pm  CRRT discontinued 8/16. Scr changed from 1.79>3.13 with some uop since discontinuation.  Goal of Therapy:  Heparin level 0.3-0.7 units/ml  INR 2-3 Monitor platelets by anticoagulation protocol: Yes   Plan:  -Cont heparin at 1650 units/hr  Warfarin 2mg  x1 Daily HL, CBC, Protime  Bonnita Nasuti Pharm.D. CPP, BCPS Clinical Pharmacist [phone] 07/25/2017 11:30 AM

## 2017-07-25 NOTE — Progress Notes (Signed)
Patient ID: [name], male   DOB: 01-28-1956, 61 y.o.   MRN: [id number]          [facility]  Date of Admission:  07/09/2017           Day 16 cefazolin ASSESSMENT: His MSSA bacteremia is responding well to cefazolin therapy. I was going to restart rifampin but since he may be restarting warfarin I will hold off to avoid significant drug drug interactions. I plan on 6 weeks of IV cefazolin therapy then chronic suppressive cephalexin if his defibrillator has not been removed.  PLAN: 1. Continue cefazolin alone 2. 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 Chlorhexidine Gluconate Cloth  6 each Topical Daily  . colchicine  0.3 mg Oral Daily  . [START ON 07/27/2017] darbepoetin (ARANESP) injection - NON-DIALYSIS  200 mcg Subcutaneous Q Wed-1800  . insulin aspart  0-5 Units Subcutaneous QHS  . insulin aspart  0-9 Units Subcutaneous TID WC  . insulin aspart  3 Units Subcutaneous TID WC  . insulin glargine  10 Units Subcutaneous QHS  . midodrine  5 mg Oral TID WC  . sodium chloride flush  10-40 mL Intracatheter Q12H  . Warfarin - Pharmacist Dosing Inpatient   Does not apply q1800    SUBJECTIVE: He is feeling better.  Review of Systems: Review of Systems  Constitutional: Negative for chills, diaphoresis and fever.  Gastrointestinal: Negative for abdominal pain, diarrhea,  nausea and vomiting.  Musculoskeletal: Positive for joint pain.    No Known Allergies  OBJECTIVE: Vitals:   07/24/17 1953 07/24/17 2313 07/25/17 0321 07/25/17 0837  BP: (!) 100/52 (!) 86/54 (!) 109/50 (!) 101/57  Pulse: 71 70 78 72  Resp:  11 14 19   Temp: 97.6 F (36.4 C) 97.7 F (36.5 C) 97.6 F (36.4 C) 98.3 F (36.8 C)  TempSrc: Oral Oral Oral Oral  SpO2: 99% 100% 100% 100%  Weight:   190 lb 7.6 oz (86.4 kg)   Height:       Body mass index is 27.33 kg/m.  Physical Exam  Constitutional:  He is sitting up in a chair.  Cardiovascular: Normal rate and regular rhythm.   Murmur heard. 2/6 systolic murmur.  Pulmonary/Chest: Effort normal and breath sounds normal.    Lab Results Lab Results  Component Value Date   WBC 8.6 07/25/2017   HGB 9.0 (L) 07/25/2017   HCT 27.9 (L) 07/25/2017   MCV 91.2 07/25/2017   PLT 124 (L) 07/25/2017    Lab Results  Component Value Date   CREATININE 3.13 (H) 07/25/2017   BUN 48 (H) 07/25/2017   NA 128 (L) 07/25/2017   K 3.7 07/25/2017   CL 96 (L) 07/25/2017   CO2 22 07/25/2017    Lab Results  Component Value Date   ALT 5 (L) 07/22/2017  AST 25 07/22/2017   ALKPHOS 135 (H) 07/22/2017   BILITOT 1.3 (H) 07/22/2017     Microbiology: No results found for this or any previous visit (from the past 240 hour(s)).  Michel Bickers, MD [facility] [place] [facility] [place] [facility] [phone] pager   [phone] cell 07/25/2017, 10:37 AM

## 2017-07-25 NOTE — Progress Notes (Signed)
Physical Therapy Treatment Patient Details Name: [name] MRN: [id number] DOB: [birth year] Today's Date: 07/25/2017    History of Present Illness 61 yr old male with extensive PMHx MI, ICM, HFrEF EF15%-20% ,BiVICD, CKD Stage IV, DM, GERD, OSA on CPAP, PAF, presents with weight gain, lower extremity swelling and decreased urine output within the last week.   Work up included AOCsHF/cardiogenic shock, BiV MSSA, demand ischemia.  Pt continues on CRRT.    PT Comments    Pt admitted with above diagnosis. Pt currently with functional limitations due to balance and endurance deficits. Pt was able to perform exercises and transfer to recliner with mod assist.  Tolerated this treatment well.  VSS.  Will continue acute PT.  Pt will benefit from skilled PT to increase their independence and safety with mobility to allow discharge to the venue listed below.     Follow Up Recommendations  CIR;Supervision/Assistance - 24 hour     Equipment Recommendations  Other (comment) (TBA)    Recommendations for Other Services Rehab consult     Precautions / Restrictions Precautions Precautions: Fall Precaution Comments: Watch BP Restrictions Weight Bearing Restrictions: No    Mobility  Bed Mobility Overal bed mobility: Needs Assistance Bed Mobility: Supine to Sit     Supine to sit: Mod assist     General bed mobility comments: requires assist to lift trunk - pt pulled up on PT.    Transfers Overall transfer level: Needs assistance Equipment used: 2 person hand held assist Transfers: Sit to/from Stand;Lateral/Scoot Transfers Sit to Stand: Mod assist;+2 physical assistance        Lateral/Scoot Transfers: Mod assist;From elevated surface General transfer comment: Pt made 3 attempts to stand during treatment with pt able to clear bottom off bed enough to scoot to chair.  NEeded assist to move right LE as he could not move it for pivot once up.  Took a few scoot/squats to get to chair with  armrest of chair dropped.    Ambulation/Gait                 Stairs            Wheelchair Mobility    Modified Rankin (Stroke Patients Only)       Balance Overall balance assessment: Needs assistance Sitting-balance support: Feet supported;No upper extremity supported Sitting balance-Leahy Scale: Fair Sitting balance - Comments: maintained EOB sitting 10 min   Standing balance support: Bilateral upper extremity supported Standing balance-Leahy Scale: Poor Standing balance comment: requires mod A +2 and bil UE support                             Cognition Arousal/Alertness: Awake/alert Behavior During Therapy: WFL for tasks assessed/performed Overall Cognitive Status: Within Functional Limits for tasks assessed Area of Impairment: Following commands;Problem solving;Awareness                       Following Commands: Follows one step commands consistently   Awareness: Emergent Problem Solving: Slow processing General Comments: WFL for basic info       Exercises Low Level/ICU Exercises Ankle Circles/Pumps: AROM;Both;10 reps;Supine Quad Sets: AROM;Both;5 reps;Supine Heel Slides: AAROM;Both;10 reps;Supine Other Exercises Other Exercises: LAQ sitting EOC x 5    General Comments General comments (skin integrity, edema, etc.): VSS with BP at end of treatment 124/58      Pertinent Vitals/Pain Pain Assessment: Faces Faces Pain Scale: Hurts even more Pain  Location: R hip with mobility/weightbearing Pain Descriptors / Indicators: Aching;Grimacing;Guarding Pain Intervention(s): Limited activity within patient's tolerance;Monitored during session;Repositioned;Premedicated before session    Home Living                      Prior Function            PT Goals (current goals can now be found in the care plan section) Progress towards PT goals: Progressing toward goals    Frequency    Min 3X/week      PT Plan Current  plan remains appropriate    Co-evaluation              AM-PAC PT "6 Clicks" Daily Activity  Outcome Measure  Difficulty turning over in bed (including adjusting bedclothes, sheets and blankets)?: Unable Difficulty moving from lying on back to sitting on the side of the bed? : Unable Difficulty sitting down on and standing up from a chair with arms (e.g., wheelchair, bedside commode, etc,.)?: Unable Help needed moving to and from a bed to chair (including a wheelchair)?: A Lot Help needed walking in hospital room?: A Lot Help needed climbing 3-5 steps with a railing? : A Lot 6 Click Score: 9    End of Session Equipment Utilized During Treatment: Gait belt Activity Tolerance: Patient limited by fatigue Patient left: with call bell/phone within reach;in chair Nurse Communication: Mobility status PT Visit Diagnosis: Other abnormalities of gait and mobility (R26.89);Muscle weakness (generalized) (M62.81);Pain Pain - Right/Left: Right Pain - part of body: Hip     Time: 0865-7846 PT Time Calculation (min) (ACUTE ONLY): 23 min  Charges:  $Therapeutic Exercise: 8-22 mins $Therapeutic Activity: 8-22 mins                    G Codes:       Keiosha Cancro,PT Acute Rehabilitation [phone] (pager)    Berline Lopes 07/25/2017, 9:43 AM

## 2017-07-26 DIAGNOSIS — R5381 Other malaise: Secondary | ICD-10-CM

## 2017-07-26 DIAGNOSIS — I5023 Acute on chronic systolic (congestive) heart failure: Secondary | ICD-10-CM

## 2017-07-26 LAB — GLUCOSE, CAPILLARY
GLUCOSE-CAPILLARY: 125 mg/dL — AB (ref 65–99)
GLUCOSE-CAPILLARY: 123 mg/dL — AB (ref 65–99)
Glucose-Capillary: 82 mg/dL (ref 65–99)
Glucose-Capillary: 86 mg/dL (ref 65–99)

## 2017-07-26 LAB — RENAL FUNCTION PANEL
ALBUMIN: 1.6 g/dL — AB (ref 3.5–5.0)
Anion gap: 10 (ref 5–15)
BUN: 53 mg/dL — AB (ref 6–20)
CO2: 21 mmol/L — ABNORMAL LOW (ref 22–32)
CREATININE: 3.21 mg/dL — AB (ref 0.61–1.24)
Calcium: 8.1 mg/dL — ABNORMAL LOW (ref 8.9–10.3)
Chloride: 97 mmol/L — ABNORMAL LOW (ref 101–111)
GFR, EST AFRICAN AMERICAN: 22 mL/min — AB (ref >60)
GFR, EST NON AFRICAN AMERICAN: 19 mL/min — AB (ref >60)
Glucose, Bld: 92 mg/dL (ref 65–99)
Phosphorus: 5.2 mg/dL — ABNORMAL HIGH (ref 2.5–4.6)
Potassium: 3.5 mmol/L (ref 3.5–5.1)
Sodium: 128 mmol/L — ABNORMAL LOW (ref 135–145)

## 2017-07-26 LAB — HEPATIC FUNCTION PANEL
ALBUMIN: 1.8 g/dL — AB (ref 3.5–5.0)
AST: 22 U/L (ref 15–41)
Alkaline Phosphatase: 102 U/L (ref 38–126)
BILIRUBIN INDIRECT: 0.8 mg/dL (ref 0.3–0.9)
Bilirubin, Direct: 0.4 mg/dL (ref 0.1–0.5)
TOTAL PROTEIN: 6.6 g/dL (ref 6.5–8.1)
Total Bilirubin: 1.2 mg/dL (ref 0.3–1.2)

## 2017-07-26 LAB — PROTIME-INR
INR: 2.17
Prothrombin Time: 24.5 seconds — ABNORMAL HIGH (ref 11.4–15.2)

## 2017-07-26 LAB — CBC
HEMATOCRIT: 26.9 % — AB (ref 39.0–52.0)
HEMOGLOBIN: 8.6 g/dL — AB (ref 13.0–17.0)
MCH: 28.7 pg (ref 26.0–34.0)
MCHC: 32 g/dL (ref 30.0–36.0)
MCV: 89.7 fL (ref 78.0–100.0)
Platelets: 105 10*3/uL — ABNORMAL LOW (ref 150–400)
RBC: 3 MIL/uL — ABNORMAL LOW (ref 4.22–5.81)
RDW: 17.1 % — ABNORMAL HIGH (ref 11.5–15.5)
WBC: 7.1 10*3/uL (ref 4.0–10.5)

## 2017-07-26 LAB — COOXEMETRY PANEL
Carboxyhemoglobin: 2.4 % — ABNORMAL HIGH (ref 0.5–1.5)
Methemoglobin: 1 % (ref 0.0–1.5)
O2 Saturation: 71.2 %
TOTAL HEMOGLOBIN: 8.8 g/dL — AB (ref 12.0–16.0)

## 2017-07-26 LAB — MAGNESIUM: MAGNESIUM: 1.9 mg/dL (ref 1.7–2.4)

## 2017-07-26 LAB — HEPARIN LEVEL (UNFRACTIONATED): Heparin Unfractionated: 0.52 IU/mL (ref 0.30–0.70)

## 2017-07-26 MED ORDER — SODIUM CHLORIDE 0.9 % IV SOLN
510.0000 mg | Freq: Once | INTRAVENOUS | Status: AC
  Administered 2017-07-26: 510 mg via INTRAVENOUS
  Filled 2017-07-26: qty 17

## 2017-07-26 MED ORDER — MIDODRINE HCL 5 MG PO TABS
10.0000 mg | ORAL_TABLET | Freq: Three times a day (TID) | ORAL | Status: DC
  Administered 2017-07-26 – 2017-08-02 (×23): 10 mg via ORAL
  Filled 2017-07-26 (×24): qty 2

## 2017-07-26 MED ORDER — METOLAZONE 5 MG PO TABS
10.0000 mg | ORAL_TABLET | Freq: Every day | ORAL | Status: DC
  Administered 2017-07-26 – 2017-07-31 (×6): 10 mg via ORAL
  Filled 2017-07-26 (×6): qty 2

## 2017-07-26 MED ORDER — FUROSEMIDE 80 MG PO TABS
160.0000 mg | ORAL_TABLET | Freq: Three times a day (TID) | ORAL | Status: DC
  Administered 2017-07-26 – 2017-08-01 (×17): 160 mg via ORAL
  Filled 2017-07-26 (×18): qty 2

## 2017-07-26 MED ORDER — WARFARIN SODIUM 2 MG PO TABS
1.0000 mg | ORAL_TABLET | Freq: Once | ORAL | Status: AC
  Administered 2017-07-26: 1 mg via ORAL
  Filled 2017-07-26: qty 0.5

## 2017-07-26 NOTE — Clinical Social Work Note (Signed)
Clinical Social Work Assessment  Patient Details  Name: [name] MRN: [id number] Date of Birth: [birth year]  Date of referral:  07/26/17               Reason for consult:  Facility Placement, Discharge Planning                Permission sought to share information with:  Chartered certified accountant granted to share information::  Yes, Verbal Permission Granted  Name::        Agency::  SNF's  Relationship::     Contact Information:     Housing/Transportation Living arrangements for the past 2 months:  Single Family Home Source of Information:  Patient, Medical Team, Other (Comment Required) (Male family member at bedside) Patient Interpreter Needed:  None Criminal Activity/Legal Involvement Pertinent to Current Situation/Hospitalization:  No - Comment as needed Significant Relationships:  Spouse, Adult Children, Other Family Members Lives with:  Spouse Do you feel safe going back to the place where you live?  Yes Need for family participation in patient care:  Yes (Comment)  Care giving concerns:  PT recommending CIR. CSW initiating SNF backup plan.   Social Worker assessment / plan:  CSW met with patient. Male family member that was not his wife at bedside. Patient gave CSW permission to discuss PT recommendations in front of her. Patient is agreeable to SNF backup plan if needed. SNF list provided for review. CIR consulted today. No further concerns. CSW encouraged patient to contact CSW as needed. CSW will continue to follow patient for support and facilitate discharge to SNF, if needed, once medically stable.  Employment status:  Retired Forensic scientist:  Medicare PT Recommendations:  Inpatient [place] / Referral to community resources:  [place]  Patient/Family's Response to care:  Patient agreeable to SNF backup if needed. Patient's family supportive and involved in patient's care. Patient appreciated social work  intervention.  Patient/Family's Understanding of and Emotional Response to Diagnosis, Current Treatment, and Prognosis:  Patient has a good understanding of the reason for admission and his need for rehab prior to returning home. Patient appears happy with hospital care.  Emotional Assessment Appearance:  Appears stated age Attitude/Demeanor/Rapport:  Other (Pleasant) Affect (typically observed):  Accepting, Appropriate, Calm, Pleasant Orientation:  Oriented to Self, Oriented to Place, Oriented to  Time, Oriented to Situation Alcohol / Substance use:  Never Used Psych involvement (Current and /or in the community):  No (Comment)  Discharge Needs  Concerns to be addressed:  Care Coordination Readmission within the last 30 days:  Yes Current discharge risk:  Dependent with Mobility Barriers to Discharge:  Continued Medical Work up   Candie Chroman, LCSW 07/26/2017, 11:29 AM

## 2017-07-26 NOTE — Progress Notes (Signed)
Subjective: Interval History: has no complaint , weak.  Objective: Vital signs in last 24 hours: Temp:  [97.4 F (36.3 C)-98.3 F (36.8 C)] 97.6 F (36.4 C) (08/21 0343) Pulse Rate:  [72-77] 73 (08/21 0343) Resp:  [10-19] 11 (08/21 0343) BP: (87-107)/(52-62) 92/58 (08/21 0343) SpO2:  [99 %-100 %] 100 % (08/21 0343) Weight:  [82.5 kg (181 lb 14.1 oz)] 82.5 kg (181 lb 14.1 oz) (08/21 0343) Weight change: -6.5 kg (-14 lb 5.3 oz)  Intake/Output from previous day: 08/20 0701 - 08/21 0700 In: 540.5 [P.O.:60; I.V.:248.5; IV Piggyback:232] Out: 1925 [OPFYT:2446] Intake/Output this shift: No intake/output data recorded.  General appearance: cooperative, no distress and pale Neck: RIJ cath Resp: diminished breath sounds bilaterally and rales bibasilar Chest wall: LSC pacer Cardio: S1, S2 normal and systolic murmur: holosystolic 2/6, blowing at apex GI: mod distension , ostomy mid abdm, pos bs, pos FW Extremities: edema 3-4+ and AVF RUA  Lab Results:  Recent Labs  07/25/17 0351 07/26/17 0409  WBC 8.6 7.1  HGB 9.0* 8.6*  HCT 27.9* 26.9*  PLT 124* 105*   BMET:  Recent Labs  07/25/17 0351 07/26/17 0409  NA 128* 128*  K 3.7 3.5  CL 96* 97*  CO2 22 21*  GLUCOSE 127* 92  BUN 48* 53*  CREATININE 3.13* 3.21*  CALCIUM 7.9* 8.1*   No results for input(s): PTH in the last 72 hours. Iron Studies:  Recent Labs  07/25/17 1144  IRON 22*  TIBC 225*    Studies/Results: No results found.  I have reviewed the patient's current medications.  Assessment/Plan: 1 CKD 4 baseline 2.5-3, close to that but vol xs.  Diuresing will use po trial.  Also ^ [place] to see if ^ RBF.  Not optimistic 2 CM bp limiting 3 Cirrhosis 4 Anemia give Fe/esa 5 HPTH ok 6 Severe debill 7 MSSA sepsis P po lasix/metol, follow vol, ? Pall care, AB     LOS: 16 days   [name] 07/26/2017,7:11 AM

## 2017-07-26 NOTE — Progress Notes (Signed)
PT continues to refuse CPAP

## 2017-07-26 NOTE — Progress Notes (Signed)
PROGRESS NOTE    [name]  NWG:[id number] DOB: 1956/01/07 DOA: 07/09/2017 PCP: Redmond School, MD    Brief Narrative: 61 y.o. male with history of ischemic cardiomyopathy status post ICD placement. Also history of chronic renal failure. Admitted on 8/4 with acute respiratory failure secondary to pulmonary edema. Subsequently found to have a MSSA bacteremia. Patient has had persistent shock requiring vasopressor support and is on renal replacement therapy. He was initially admitted to [facility] and transferred to [facility] service on 8/20.  8/21 no new issues, worked with PT, requested CIR consult.   Assessment & Plan:   Principal Problem:   Staphylococcus aureus bacteremia with sepsis [facility]) Active Problems:   Ischemic cardiomyopathy-EF 35% 04/24/15 echo   Long term current use of anticoagulant therapy   CAD with LCX disease and stents to LAD   Chronic a-fib (HCC)   Chronic kidney disease, stage IV (severe) (HCC)   OSA on CPAP   Hyperlipidemia   BiV ICD (St Jude) gen change 04/10/15   Chronic atrial fibrillation (HCC)   Right heart failure (HCC)   Septic shock (HCC)   Cardiorenal syndrome with renal failure   Infected defibrillator (HCC)   Septic hip (HCC)   Severe sepsis with septic shock ([place])   MSSA bacteremia and septic shock:  Required vaso pressors support, off pressors now . TEE negative for endocarditis.  Currently on ANCEF.  Dr Kongmeng Medal with ID recommended he will need a holiday from his HD catheter and central lines with repeat blood cultures after removal of lines and ultimately removal of ICD but pt is ICD dependent, cannot remove the ICD, due to severe co morbidities.  Hence 6 weeks of IV cefazolin therapy and followed by chronic suppressive cephalexin if he continues with his ICD, as per ID recommendations.  Currently afebrile and no leukocytosis.   Ischemic cardiomyopathy/ cardiogenic shock/ acute on chronic systolic heart failure. Marland Kitchen  LVEF of 20%, no vegetations or  endocarditis on TEE. :  S/p ICD,   Cardiology on board and diuresis with lasix gtt.  Monitor renal parameters on IV lasix.  Repeat CXR on 8/19 show improving right pleural effusions.  Diuresed about 2 lit yesterday and 19 lit since admission.    Acute on CKD stage 4:  Cardiorenal syndrome.  Renal on board, had brief HD in the past.  Has RUE fistula.  On lasix gtt.  His creatinine worsening gradually and will probably need HD later on if it continues to trend upwards.  Discussed with the patient.   Atrial fibrillation:  Rate controlled.  On coumadin and heparin gtt. therapeutic INR.   Anemia of chronic disease from CKD:  S/p Rbc transfusion on 8/17, hgb stable around 9 .  aranesp on board.    Type 2 DM:  CBG (last 3)   Recent Labs  07/25/17 2118 07/26/17 0756 07/26/17 1216  GLUCAP 107* 82 86    Resume SSI. No change in meds.    H/O Rectal Cancer with diverting colostomy.  No new complaints.   Hyponatremia: possibly from renal failure.    Chronic thrombocytopenia:  His platelets have been as low as 75,000 in the past.  Continue to watch for signs of bleeding.   DVT prophylaxis: heparin gtt.  Code Status: full code.  Family Communication: none at bedside.  Disposition Plan pending further work up by renal. CIR consult pending.    Consultants:   Cardiology  Nephrology.    Procedures:    TEE 8/14:  EF  20% with diffuse hypokinesis. Moderate hypokinesis of RV. ICD/pacing wires noted without vegetation. Left atrium dilated without thrombus. Right atrium dilated and pacing wires without vegetation. Mild aortic insufficiency without vegetation. Mild mitral regurgitation without vegetation. Moderate to severe tricuspid regurgitation without vegetation. Pulmonic valve without vegetation. No PFO or ASD. No pericardial effusion.   Antimicrobials: vancomycin on 8/5  Ancef from 8/6   SIGNIFICANT EVENTS: 08/04 - Admit 08/05 - Started on CVVHD 08/15 - CVVHD  stopped  8/20 - transfer to [facility] service.    Subjective: No chest pain  No nausea or vomiting.  No new complaints.   Objective: Vitals:   07/26/17 0340 07/26/17 0343 07/26/17 0823 07/26/17 1126  BP: 92/60 (!) 92/58 (!) 92/57 (!) 98/53  Pulse: 77 73 76 82  Resp: 10 11 13 14   Temp:  97.6 F (36.4 C) (!) 97.4 F (36.3 C) (!) 97.5 F (36.4 C)  TempSrc:  Oral Oral Oral  SpO2: 100% 100% 100% 100%  Weight:  82.5 kg (181 lb 14.1 oz)    Height:        Intake/Output Summary (Last 24 hours) at 07/26/17 1301 Last data filed at 07/26/17 1010  Gross per 24 hour  Intake            540.5 ml  Output             2075 ml  Net          -1534.5 ml   Filed Weights   07/24/17 1706 07/25/17 0321 07/26/17 0343  Weight: 89 kg (196 lb 3.4 oz) 86.4 kg (190 lb 7.6 oz) 82.5 kg (181 lb 14.1 oz)    Examination:  General exam: Appears calm and comfortable sitting up in the chair. Off oxygen.  Respiratory system: air entry fair, no wheezing or rhonchi. No crackles.  Cardiovascular system: S1 & S2 heard, RRR. Murmer present. 3+ leg edema .  Gastrointestinal system: Abdomen is soft NT, ND , BS+ Central nervous system: Alert and oriented. No focal neurological deficits. Extremities: Symmetric 5 x 5 power. Right lower extremity wrapped in  Bandage.  Skin: No rashes, lesions or ulcers Psychiatry: Judgement and insight appear normal. Mood & affect appropriate.     Data Reviewed: I have personally reviewed following labs and imaging studies  CBC:  Recent Labs Lab 07/22/17 0500 07/22/17 1700 07/23/17 0414 07/24/17 0336 07/25/17 0351 07/26/17 0409  WBC 13.7*  --  10.6* 10.2 8.6 7.1  HGB 7.4* 8.9* 8.9* 8.9* 9.0* 8.6*  HCT 23.6* 27.3* 27.4* 27.8* 27.9* 26.9*  MCV 92.9  --  89.3 90.3 91.2 89.7  PLT 167  --  118* 115* 124* 884*   Basic Metabolic Panel:  Recent Labs Lab 07/22/17 0500 07/22/17 2132 07/23/17 0414 07/24/17 0336 07/24/17 0500 07/25/17 0351 07/26/17 0409  NA 128* 127* 128*   --  127* 128* 128*  K 4.7 4.4 4.3  --  4.0 3.7 3.5  CL 96* 97* 96*  --  95* 96* 97*  CO2 23 23 22   --  22 22 21*  GLUCOSE 149* 79 80  --  104* 127* 92  BUN 31* 36* 37*  --  42* 48* 53*  CREATININE 2.30* 2.53* 2.66*  --  2.84* 3.13* 3.21*  CALCIUM 8.2* 8.1* 8.1*  --  8.1* 7.9* 8.1*  MG 2.2  --  2.1 1.9  --  1.9 1.9  PHOS 4.8* 4.9* 5.0*  --  5.1* 4.9* 5.2*   GFR: Estimated Creatinine Clearance: 24.6 mL/min (  A) (by C-G formula based on SCr of 3.21 mg/dL (H)). Liver Function Tests:  Recent Labs Lab 07/22/17 0803  07/23/17 0414 07/24/17 0500 07/25/17 0351 07/26/17 0409 07/26/17 0930  AST 25  --   --   --   --   --  22  ALT 5*  --   --   --   --   --  <5*  ALKPHOS 135*  --   --   --   --   --  102  BILITOT 1.3*  --   --   --   --   --  1.2  PROT 6.4*  --   --   --   --   --  6.6  ALBUMIN 1.9*  < > 1.8* 1.7* 1.7* 1.6* 1.8*  < > = values in this interval not displayed. No results for input(s): LIPASE, AMYLASE in the last 168 hours. No results for input(s): AMMONIA in the last 168 hours. Coagulation Profile:  Recent Labs Lab 07/22/17 0500 07/23/17 0414 07/24/17 0336 07/25/17 0351 07/26/17 0409  INR 1.58 1.48 1.45 1.55 2.17   Cardiac Enzymes: No results for input(s): CKTOTAL, CKMB, CKMBINDEX, TROPONINI in the last 168 hours. BNP (last 3 results) No results for input(s): PROBNP in the last 8760 hours. HbA1C: No results for input(s): HGBA1C in the last 72 hours. CBG:  Recent Labs Lab 07/25/17 1155 07/25/17 1621 07/25/17 2118 07/26/17 0756 07/26/17 1216  GLUCAP 110* 81 107* 82 86   Lipid Profile: No results for input(s): CHOL, HDL, LDLCALC, TRIG, CHOLHDL, LDLDIRECT in the last 72 hours. Thyroid Function Tests: No results for input(s): TSH, T4TOTAL, FREET4, T3FREE, THYROIDAB in the last 72 hours. Anemia Panel:  Recent Labs  07/25/17 1144  TIBC 225*  IRON 22*   Sepsis Labs: No results for input(s): PROCALCITON, LATICACIDVEN in the last 168 hours.  No  results found for this or any previous visit (from the past 240 hour(s)).       Radiology Studies: No results found.      Scheduled Meds: . colchicine  0.3 mg Oral Daily  . [START ON 07/27/2017] darbepoetin (ARANESP) injection - NON-DIALYSIS  200 mcg Subcutaneous Q Wed-1800  . furosemide  160 mg Oral TID  . insulin aspart  0-5 Units Subcutaneous QHS  . insulin aspart  0-9 Units Subcutaneous TID WC  . insulin aspart  3 Units Subcutaneous TID WC  . insulin glargine  10 Units Subcutaneous QHS  . metolazone  10 mg Oral Daily  . midodrine  10 mg Oral TID WC  . sodium chloride flush  10-40 mL Intracatheter Q12H  . warfarin  1 mg Oral ONCE-1800  . Warfarin - Pharmacist Dosing Inpatient   Does not apply q1800   Continuous Infusions: . sodium chloride 250 mL (07/24/17 1900)  .  ceFAZolin (ANCEF) IV Stopped (07/26/17 0548)     LOS: 16 days    Time spent: 35 minutes.     Hosie Poisson, MD [facility] Pager [phone]  If 7PM-7AM, please contact night-coverage [email or web address] Password [email or web address] 07/26/2017, 1:01 PM

## 2017-07-26 NOTE — Progress Notes (Signed)
Patient ID: [name], male   DOB: [birth year], 61 y.o.   MRN: [id number]     Advanced Heart Failure Rounding Note  Primary Cardiologist: Bensimhon  Subjective:    Admitted with staph bacteremia with mixed septic/cardiogenic shock.   TEE 07/19/17 with LVEF 20%. No evidence of endocarditis or vegetation on valves/ICD  CVVD stopped 07/20/17.   Continues to diurese with high dose lasix + metolazone. Making over 1 liter of urine. Afebrile. Able to take a step to the chair.   Denies SOB. Denies CP.      Objective:   Weight Range: 181 lb 14.1 oz (82.5 kg) Body mass index is 26.1 kg/m.   Vital Signs:   Temp:  [97.4 F (36.3 C)-97.6 F (36.4 C)] 97.6 F (36.4 C) (08/21 0343) Pulse Rate:  [72-77] 76 (08/21 0823) Resp:  [10-17] 13 (08/21 0823) BP: (87-107)/(52-62) 92/57 (08/21 0823) SpO2:  [99 %-100 %] 100 % (08/21 0823) Weight:  [181 lb 14.1 oz (82.5 kg)] 181 lb 14.1 oz (82.5 kg) (08/21 0343) Last BM Date: 07/25/17  Weight change: Filed Weights   07/24/17 1706 07/25/17 0321 07/26/17 0343  Weight: 196 lb 3.4 oz (89 kg) 190 lb 7.6 oz (86.4 kg) 181 lb 14.1 oz (82.5 kg)    Intake/Output:   Intake/Output Summary (Last 24 hours) at 07/26/17 0857 Last data filed at 07/26/17 6378  Gross per 24 hour  Intake            540.5 ml  Output             1675 ml  Net          -1134.5 ml      Physical Exam   CVP 16  General:  Chronically ill. In bed.No resp difficulty HEENT: normal Neck: supple. JVD to jaw. RIJ HD cath. Carotids 2+ bilat; no bruits. No lymphadenopathy or thryomegaly appreciated. Cor: PMI nondisplaced. Regular rate & rhythm. No rubs, gallops. 2/6 AS murmur. Left upper chest ICD Lungs: clear Abdomen: soft, nontender, nondistended. No hepatosplenomegaly. No bruits or masses. Good bowel sounds. LLQ colostomy  Extremities: no cyanosis, clubbing, rash, LLE dressing. R and LLE 3+ edema.  Neuro: alert & orientedx3, cranial nerves grossly intact. moves all 4  extremities w/o difficulty. Affect pleasant    Telemetry   BiV pacing 70s underlying rhythm 70s   Labs    CBC  Recent Labs  07/25/17 0351 07/26/17 0409  WBC 8.6 7.1  HGB 9.0* 8.6*  HCT 27.9* 26.9*  MCV 91.2 89.7  PLT 124* 588*   Basic Metabolic Panel  Recent Labs  07/25/17 0351 07/26/17 0409  NA 128* 128*  K 3.7 3.5  CL 96* 97*  CO2 22 21*  GLUCOSE 127* 92  BUN 48* 53*  CREATININE 3.13* 3.21*  CALCIUM 7.9* 8.1*  MG 1.9 1.9  PHOS 4.9* 5.2*   Liver Function Tests  Recent Labs  07/25/17 0351 07/26/17 0409  ALBUMIN 1.7* 1.6*   No results for input(s): LIPASE, AMYLASE in the last 72 hours. Cardiac Enzymes No results for input(s): CKTOTAL, CKMB, CKMBINDEX, TROPONINI in the last 72 hours.  BNP: BNP (last 3 results)  Recent Labs  06/13/17 1205 07/09/17 2151  BNP 1,151.0* 1,553.9*    ProBNP (last 3 results) No results for input(s): PROBNP in the last 8760 hours.   D-Dimer No results for input(s): DDIMER in the last 72 hours. Hemoglobin A1C No results for input(s): HGBA1C in the last 72 hours. Fasting Lipid Panel No results  for input(s): CHOL, HDL, LDLCALC, TRIG, CHOLHDL, LDLDIRECT in the last 72 hours. Thyroid Function Tests No results for input(s): TSH, T4TOTAL, T3FREE, THYROIDAB in the last 72 hours.  Invalid input(s): FREET3  Other results:   Imaging   No results found.  Medications:     Scheduled Medications: . colchicine  0.3 mg Oral Daily  . [START ON 07/27/2017] darbepoetin (ARANESP) injection - NON-DIALYSIS  200 mcg Subcutaneous Q Wed-1800  . furosemide  160 mg Oral TID  . insulin aspart  0-5 Units Subcutaneous QHS  . insulin aspart  0-9 Units Subcutaneous TID WC  . insulin aspart  3 Units Subcutaneous TID WC  . insulin glargine  10 Units Subcutaneous QHS  . metolazone  10 mg Oral Daily  . midodrine  10 mg Oral TID WC  . sodium chloride flush  10-40 mL Intracatheter Q12H  . Warfarin - Pharmacist Dosing Inpatient   Does not  apply q1800    Infusions: . sodium chloride 250 mL (07/24/17 1900)  .  ceFAZolin (ANCEF) IV Stopped (07/26/17 0548)  . ferumoxytol    . heparin 1,650 Units/hr (07/25/17 2004)    PRN Medications: sodium chloride, acetaminophen, alum & mag hydroxide-simeth, fentaNYL (SUBLIMAZE) injection, HYDROcodone-acetaminophen, magic mouthwash w/lidocaine, ondansetron (ZOFRAN) IV, sodium chloride flush    Patient Profile   [name] is a 61 year old with a history of DMII, [place] 2016, rectal cancer, CKD Stage IV, chronic A fib, S/P AVN ablation, PAF, CAD BMS LAD 2011 BMS LAD 2009, OSA, chronic systolic heart failure, St Jude BiV ICD admitted with AKI and shock with elevated lactate, suspect cardiogenic.   Assessment/Plan   1. Acute on chronic systolic CHF/cardiogenic shock: Last echo in 8/17 with EF 15-20%.  Ischemic cardiomyopathy, has [employer] Jude BiV ICD (despite chronic afib appears to be effectively BiV pacing).  Had recent admission (discharged 06/21/17) requiring milrinone support and diuresis.  Admitted with soft BP (SBP 80s-90s) + elevated lactate.  Suspect mixed septic/cardiogenic shock with MSSA bacteremia.  - CVVHD stopped 07/20/17.  - Increased urine output. Todays CO-OX is 71%.   - No beta blocker with decompensation, hypotension.  - No ARB with AKI.  - Nephrology doubts he will tolerate iHD. Marland Kitchen   2. AKI on CKD stage 4:  - Baseline creatinine 2.5. Creatinine trending up.  - Discussed with Nephrology. Midodrine increased.   3. Atrial fibrillation: Chronic - Restarted on warfarin, no plans to extract device.  - INR 1.55, HF PharmD following.   4. CAD:  - Denies chest pain.  - Continue ASA. No statin with recent elevated LFT's    5. ID:  - Blood CX 2/2 MSSA. Antibiotics narrowed, now on Ancef. 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   - Not planning ICD extraction for now due to severe comorbidities. He is device dependent.  Once IV abx complete will plan suppressive abx.  If recovers further can reconsider device extraction. Timing of PICC per ID.  D/w EP - TEE 07/19/17 with no evidence of endocarditis.   - No change.   6. H/O Rectal CA with diverting colostomy   7. Type II diabetes:  - Continue sliding scale.   8. Liver failure:  - Check LFTs.    9. Anemia of chronic disease. - s/p RBCs on 8/17.  - Todays hgb 8.6 .  - Aranesp ordered.  10. Deconditioning-- PT/OT recommending CIR.     Length of Stay: [place], NP  07/26/2017, 8:57 AM  Advanced  Heart Failure Team Pager [phone] (M-F; [place])  Please contact [facility] for night-coverage after hours (4p -7a ) and weekends on [email or web address]  Patient seen and examined with Darrick Grinder, NP. We discussed all aspects of the encounter. I agree with the assessment and plan as stated above.   Remains very debilitated. Working with PT.   Volume status improving with IV lasix but BP very soft and creatinine trending up. Will attempt to transition to po lasix and see if we can maintain volume status.  Continue midodrine. D/w Dr. Jimmy Footman who doubts he will be able to tolerate iHD so if unable to maintain volume status will need to consider Hospice care.   Continue abx. Not candidate for device extraction.   Glori Bickers, MD  1:58 PM

## 2017-07-26 NOTE — Consult Note (Signed)
   [facility] CM Inpatient Consult   07/26/2017  [name] 30-Jun-1956 [id number]   Patient reviewed for multiple admissions and has had several outreaches with [place] [place] management for needs and post hospital follow up in the Medicare ACO.  Chart reviewed for disposition needs and patient currently to admit to inpatient rehab for disposition. No immediate [facility] Care Management needs noted for community.  Please refer to [place] Management is needs arises and patient agreeable to services.  For questions, please contact:  Natividad Brood, RN BSN [facility] Liaison  [phone] business mobile phone Toll free office [phone]

## 2017-07-26 NOTE — Consult Note (Signed)
Physical Medicine and Rehabilitation Consult Reason for Consult: Decreased functional mobility Referring Physician: [facility]   HPI: [name] is a 61 y.o. 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 Per chart review patient lives with spouse independent prior to admission and still driving. One level home one-step entry. Presented 07/10/2017 with increasing bilateral lower extremity edema and increasing shortness of breath. Patient recently seen in urgent care 2 weeks ago for right groin pain he did receive Flexeril was given a prescription for Bactrim which she did not take. Chest x-ray showed right pleural effusion and right lung base atelectasis versus infiltrate. Unilateral films of the pelvis showed no fracture. CT abdomen and pelvis showed cirrhosis moderate ascites. Echocardiogram with ejection fraction of 22-02% systolic function severely reduced. Blood cultures MSSA placed on broad-spectrum antibiotics. TEE completing showing no evidence of endocarditis and no plans to extract ICD device. Renal service follow-up for severe CKD stage IV requiring CRRT initiated 07/11/2017. Patient remains on chronic Coumadin therapy. Therapy evaluations completed an ongoing patient limited by fatigue. Request made for physical medicine rehabilitation consult.   Review of Systems  Constitutional: Negative for chills and fever.  HENT: Negative for hearing loss.   Eyes: Negative for blurred vision and double vision.  Respiratory: Positive for shortness of breath.   Cardiovascular: Positive for palpitations and leg swelling.  Gastrointestinal: Positive for constipation. Negative for nausea.       GERD  Musculoskeletal: Positive for joint  pain and myalgias.  Skin: Negative for rash.  Neurological: Positive for dizziness. Negative for seizures.  All other systems reviewed and are negative.  Past Medical History:  Diagnosis Date  . Adenocarcinoma of rectum ([place])    a. 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 AICD (automatic cardioverter/defibrillator) present 2002   BI V ICD  . Anemia   . CAD (coronary artery disease)    a. BMS to LAD 2001 at [facility] b. PTCA/atherectomy ramus and BMS to LAD 2009  . CHF (congestive heart failure) ([place])   . Cholelithiasis 06/2015  . Chronic kidney disease, stage IV (severe) ([place])   . Chronic systolic heart failure ([place])   . Cirrhosis ([place])   . Colostomy in place [facility])   . Dizziness    a. chronic. Admission for this 07/18/2014  . DM type 2, uncontrolled, with renal complications ([place])   . Dysrhythmia   . Essential hypertension, benign   . GERD (gastroesophageal reflux disease)   . HCAP (healthcare-associated pneumonia) 07/21/2015  . Hematuria   . History of blood transfusion    "I've had 2 units so far this year" (09/27/2015)  . HLD (hyperlipidemia)   . Ischemic cardiomyopathy    EF 18% by nuclear study 2016, multiple myocardial infarctions in past    . Myocardial infarction ([place]) 2001  . Obesity   . Orthostatic hypotension   . OSA on CPAP   . Paroxysmal atrial fibrillation ([facility])    a. on amiodarone, digoxin and Eliquis  . PONV (postoperative nausea and vomiting)   . Prostate cancer ([place])    a. s/p seed implants with chemo and radiation  . SAH (subarachnoid hemorrhage) ([place])    post-traumatic (fall) [facility] 12/2014  . TIA (transient ischemic attack)    Past Surgical History:  Procedure Laterality Date  . Abdominal and  Perineal Resection of Rectum with Total Mesorectal Excision  10/04/2007  . AV FISTULA PLACEMENT Right 09/16/2015   Procedure: ARTERIOVENOUS (AV) FISTULA CREATION - BRACHIOCEPHALIC;  Surgeon: Elam Dutch, MD;  Location: [place];  Service: Vascular;  Laterality: Right;  . BI-VENTRICULAR  IMPLANTABLE CARDIOVERTER DEFIBRILLATOR  (CRT-D)  2009  . CARDIAC CATHETERIZATION  08/2001; 2009   ; [name] 07/10/2013  . CARDIAC DEFIBRILLATOR PLACEMENT  2002  . CARDIAC DEFIBRILLATOR PLACEMENT  2009   Upgraded to a BiV ICD  . COLONOSCOPY  09/14/2011   Dr. Gala Romney: via colostomy, Single pedunculated benign inflammatory polyp. Due for surveillance Oct 2015  . COLONOSCOPY N/A 07/02/2014   Procedure: COLONOSCOPY;  Surgeon: Daneil Dolin, MD;  [facility];  Service: Endoscopy;  Laterality: N/A;  7:30 / COLONOSCOPY THRU COLOSTOMY  . COLONOSCOPY N/A 12/11/2014   Dr. Gala Romney via colostomy. Normal. Repeat in 2021.   Marland Kitchen COLONOSCOPY N/A 08/24/2015   Dr. Havery Moros: diminutive polyp not removed, stoma site of bleeding  . COLOSTOMY  2008  . CORONARY ANGIOPLASTY WITH STENT PLACEMENT  2001; ~ 2006   "1 + 1"   . ELECTROPHYSIOLOGIC STUDY N/A 08/28/2015   Procedure: AV Node Ablation;  Surgeon: Will Meredith Leeds, MD;  Location: [place] CV LAB;  Service: Cardiovascular;  Laterality: N/A;  . EP IMPLANTABLE DEVICE N/A 04/10/2015   Procedure: Ppm/Biv Ppm Generator Changeout;  Surgeon: Evans Lance, MD;  Location: [place] INVASIVE CV LAB CUPID;  Service: Cardiovascular;  Laterality: N/A;  . ERCP N/A 06/11/2016   Procedure: ENDOSCOPIC RETROGRADE CHOLANGIOPANCREATOGRAPHY (ERCP);  Surgeon: Teena Irani, MD;  Location: [facility] ENDOSCOPY;  Service: Endoscopy;  Laterality: N/A;  . ERCP N/A 01/26/2017   Procedure: ENDOSCOPIC RETROGRADE CHOLANGIOPANCREATOGRAPHY (ERCP);  Surgeon: Teena Irani, MD;  Location: [facility] ENDOSCOPY;  Service: Endoscopy;  Laterality: N/A;  . ERCP  06/2016   Dr. Amedeo Plenty: duodenal fistula, dilated bile duct but no obvious stones, s/p sphincterotomy and stent placement  . ERCP  01/2017   Dr. Amedeo Plenty: CBD stones, s/p sphincterotomy, balloon extraction, stent removal  . ESOPHAGOGASTRODUODENOSCOPY N/A 07/02/2014   Procedure: ESOPHAGOGASTRODUODENOSCOPY (EGD);  Surgeon: Daneil Dolin, MD;  [facility];   Service: Endoscopy;  Laterality: N/A;  7:30  . ESOPHAGOGASTRODUODENOSCOPY N/A 12/11/2014   BTD:HRCBUL EGD  . ESOPHAGOGASTRODUODENOSCOPY (EGD) WITH PROPOFOL N/A 04/18/2017   Procedure: ESOPHAGOGASTRODUODENOSCOPY (EGD) WITH PROPOFOL;  Surgeon: Daneil Dolin, MD;  [facility];  Service: Endoscopy;  Laterality: N/A;  2:30pm  . GASTROINTESTINAL STENT REMOVAL N/A 01/26/2017   Procedure: GASTROINTESTINAL STENT REMOVAL;  Surgeon: Teena Irani, MD;  Location: [place];  Service: Endoscopy;  Laterality: N/A;  . GIVENS CAPSULE STUDY N/A 07/23/2015   Dr. Michail Sermon: small non-bleeding AVM, mild gastritis  . LEFT HEART CATHETERIZATION WITH CORONARY ANGIOGRAM N/A 07/13/2013   Procedure: LEFT HEART CATHETERIZATION WITH CORONARY ANGIOGRAM;  Surgeon: Lorretta Harp, MD;  Location: [facility] CATH LAB;  Service: Cardiovascular;  Laterality: N/A;  . Venia Minks DILATION N/A 07/02/2014   Procedure: Venia Minks DILATION;  Surgeon: Daneil Dolin, MD;  [facility];  Service: Endoscopy;  Laterality: N/A;  7:30  . MALONEY DILATION N/A 04/18/2017   Procedure: Venia Minks DILATION;  Surgeon: Daneil Dolin, MD;  [facility];  Service: Endoscopy;  Laterality: N/A;  . PORTACATH PLACEMENT  06/2007   "removed ~ 1 yr later"  . RIGHT HEART CATHETERIZATION N/A 02/24/2015   Procedure: RIGHT HEART CATH;  Surgeon: Jolaine Artist, MD;  Location: [facility] CATH LAB;  Service: Cardiovascular;  Laterality: N/A;  . SAVORY DILATION N/A 07/02/2014   Procedure: SAVORY DILATION;  Surgeon: Daneil Dolin, MD;  [facility];  Service: Endoscopy;  Laterality: N/A;  7:30   Family History  Problem Relation Age of Onset  . Colon cancer Mother 52  . Coronary artery disease Father   . Colon cancer Sister 4  . Diabetes Sister   . Colon cancer Other        2 cousins, succumbed to illness   Social History:  reports that he has never smoked. He has never used smokeless tobacco. He reports that he does not drink alcohol or  use drugs. Allergies: No Known Allergies Medications Prior to Admission  Medication Sig Dispense Refill  . atorvastatin (LIPITOR) 40 MG tablet Take 1 tablet (40 mg total) by mouth daily. (Patient taking differently: Take 40 mg by mouth at bedtime. ) 30 tablet 6  . Coenzyme Q10 (COQ10) 50 MG CAPS Take 1 capsule by mouth daily.    . collagenase (SANTYL) ointment Apply topically daily. (Patient taking differently: Apply topically every other day. At night) 15 g 0  . ferrous sulfate 325 (65 FE) MG tablet Take 1 tablet (325 mg total) by mouth 3 (three) times daily with meals. 90 tablet 0  . glucose blood test strip Use to test blood sugar 3 times daily 100 each 4  . insulin aspart (NOVOLOG FLEXPEN) 100 UNIT/ML FlexPen 5 units before each meal (Patient taking differently: Inject 6 Units into the skin 3 (three) times daily with meals. 5 units before each meal) 15 mL 1  . metolazone (ZAROXOLYN) 2.5 MG tablet take 1 tablet by mouth weekly if needed for SWELLING 12 tablet 3  . metoprolol succinate (TOPROL-XL) 25 MG 24 hr tablet Take 0.5 tablets (12.5 mg total) by mouth 2 (two) times daily. 45 tablet 3  . nitroGLYCERIN (NITROSTAT) 0.4 MG SL tablet Place 1 tablet (0.4 mg total) under the tongue every 5 (five) minutes as needed for chest pain. 25 tablet 3  . pantoprazole (PROTONIX) 40 MG tablet Take 1 tablet (40 mg total) by mouth daily. 30 tablet 0  . polyethylene glycol (MIRALAX / GLYCOLAX) packet Take 17 g by mouth daily as needed for mild constipation. 14 each 0  . potassium chloride SA (K-DUR,KLOR-CON) 20 MEQ tablet Take 2 tablets (40 mEq total) by mouth daily. (Patient taking differently: Take 20-40 mEq by mouth daily. Only takes 2 tablets when taking metolazone) 60 tablet 6  . PROAIR RESPICLICK 127 (90 BASE) MCG/ACT AEPB Inhale 1 puff into the lungs every 6 (six) hours as needed (for breathing).   0  . torsemide (DEMADEX) 20 MG tablet TAKE 4 TABLETS IN THE MORNING AND 2 TABLET IN THE EVENING 180 tablet 6    . warfarin (COUMADIN) 2 MG tablet Take 1 tablet daily except 1/2 tablet on Tuesdays and Fridays (Patient taking differently: Take 1 tablet daily except 1/2 tablet on Tuesdays and Thursdays) 30 tablet 3  . Insulin Glargine (LANTUS SOLOSTAR) 100 UNIT/ML Solostar Pen Inject 15 Units into the skin daily at 10 pm. (Patient not taking: Reported on 06/13/2017) 5 pen 3    Home: Home Living Family/patient expects to be discharged to:: Private residence Living Arrangements: Spouse/significant other Available Help at Discharge: Family, Available PRN/intermittently Type of Home: House Home Access: Stairs to enter [employer] of Steps: 1 Home Layout: One level Bathroom Shower/Tub: Public librarian, Multimedia programmer: Standard Home Equipment: Environmental consultant - 4 wheels, Environmental consultant -  standard, Tub bench (may be able to find more of "Mom's" equipment)  Functional History: Prior Function Level of Independence: Independent Functional Status:  Mobility: Bed Mobility Overal bed mobility: Needs Assistance Bed Mobility: Sit to Supine Rolling: Min assist Sidelying to sit: Mod assist Supine to sit: Mod assist Sit to supine: Max assist, +2 for physical assistance General bed mobility comments: assist to lower trunk and to lift LEs onto bed  Transfers Overall transfer level: Needs assistance Equipment used: 2 person hand held assist Transfers: Lateral/Scoot Transfers Sit to Stand: Mod assist, +2 physical assistance Stand pivot transfers: Min assist, +2 safety/equipment  Lateral/Scoot Transfers: Max assist, +2 physical assistance General transfer comment: Pt reports signficant Rt hip pain, unable to stand (has been sitting in recliner since this am, and requesting to transfer back to bed.  Attempted squat pivot transfer to Rt, however, pt unable to flex hips adequately to shift weight anteriorly for safe transfer due to pain - able to achieve x 1, but unable to shift weight to the Rt.  Performed  scoot transfer using bed pads.  he was able to assist minimally  Ambulation/Gait Ambulation/Gait assistance: Mod assist, +2 safety/equipment Ambulation Distance (Feet):  (61f total with seated rest break) Assistive device:  (EVA walker) Gait Pattern/deviations: Step-through pattern, Decreased step length - right, Decreased step length - left, Drifts right/left, Trunk flexed, Narrow base of support General Gait Details: cues for posture; pt with increased trunk flexion with fatigue; assist for balance and management of AD; VSS    ADL: ADL Overall ADL's : Needs assistance/impaired Eating/Feeding: Bed level, Set up Grooming: Wash/dry hands, Wash/dry face, Minimal assistance, Brushing hair, Sitting Upper Body Bathing: Maximal assistance, Bed level, Sitting Lower Body Bathing: Total assistance, Sit to/from stand Upper Body Dressing : Sitting, Total assistance Lower Body Dressing: Total assistance, Sit to/from stand Toilet Transfer: Moderate assistance, +2 for physical assistance, BSC Toileting- Clothing Manipulation and Hygiene: Total assistance, Sit to/from stand Functional mobility during ADLs: Moderate assistance, +2 for physical assistance, +2 for safety/equipment  Cognition: Cognition Overall Cognitive Status: Within Functional Limits for tasks assessed Orientation Level: (P) Oriented X4 Cognition Arousal/Alertness: Awake/alert Behavior During Therapy: WFL for tasks assessed/performed Overall Cognitive Status: Within Functional Limits for tasks assessed Area of Impairment: Following commands, Problem solving, Awareness Following Commands: Follows one step commands consistently Awareness: Emergent Problem Solving: Slow processing General Comments: WFL for basic info   Blood pressure (!) 92/57, pulse 76, temperature (!) 97.4 F (36.3 C), temperature source Oral, resp. rate 13, height '5\' 10"'$  (1.778 m), weight 82.5 kg (181 lb 14.1 oz), SpO2 100 %. Physical Exam  Vitals  reviewed. HENT:  Head: Normocephalic.  Eyes: EOM are normal.  Neck: Normal range of motion. Neck supple. No thyromegaly present.  Cardiovascular:  Cardiac rate controlled  Respiratory:  Decreased breath sounds at the bases  GI: Soft. Bowel sounds are normal. He exhibits no distension.  Neurological:  Mood is flat but appropriate. Oriented to person place and time. UE 3+ prox to 4/5 distally. LLE: 3-HF and KE and 4/5 ADF/PF. [name]: 1+ HF, KE and 2 ADF/PF. Decreased LT along right anterolateral thigh.   Skin: Skin is warm.  Multiple ecchymoses  Psychiatric:  flat    Results for orders placed or performed during the hospital encounter of 07/09/17 (from the past 24 hour(s))  Iron and TIBC     Status: Abnormal   Collection Time: 07/25/17 11:44 AM  Result Value Ref Range   Iron 22 (L) 45 - 182  ug/dL   TIBC 225 (L) 250 - 450 ug/dL   Saturation Ratios 10 (L) 17.9 - 39.5 %   UIBC 203 ug/dL  Glucose, capillary     Status: Abnormal   Collection Time: 07/25/17 11:55 AM  Result Value Ref Range   Glucose-Capillary 110 (H) 65 - 99 mg/dL  Glucose, capillary     Status: None   Collection Time: 07/25/17  4:21 PM  Result Value Ref Range   Glucose-Capillary 81 65 - 99 mg/dL  Glucose, capillary     Status: Abnormal   Collection Time: 07/25/17  9:18 PM  Result Value Ref Range   Glucose-Capillary 107 (H) 65 - 99 mg/dL   Comment 1 Notify RN   Magnesium     Status: None   Collection Time: 07/26/17  4:09 AM  Result Value Ref Range   Magnesium 1.9 1.7 - 2.4 mg/dL  Renal function panel (daily at 0500)     Status: Abnormal   Collection Time: 07/26/17  4:09 AM  Result Value Ref Range   Sodium 128 (L) 135 - 145 mmol/L   Potassium 3.5 3.5 - 5.1 mmol/L   Chloride 97 (L) 101 - 111 mmol/L   CO2 21 (L) 22 - 32 mmol/L   Glucose, Bld 92 65 - 99 mg/dL   BUN 53 (H) 6 - 20 mg/dL   Creatinine, Ser 3.21 (H) 0.61 - 1.24 mg/dL   Calcium 8.1 (L) 8.9 - 10.3 mg/dL   Phosphorus 5.2 (H) 2.5 - 4.6 mg/dL   Albumin  1.6 (L) 3.5 - 5.0 g/dL   GFR calc non Af Amer 19 (L) >60 mL/min   GFR calc Af Amer 22 (L) >60 mL/min   Anion gap 10 5 - 15  Heparin level (unfractionated)     Status: None   Collection Time: 07/26/17  4:09 AM  Result Value Ref Range   Heparin Unfractionated 0.52 0.30 - 0.70 IU/mL  CBC     Status: Abnormal   Collection Time: 07/26/17  4:09 AM  Result Value Ref Range   WBC 7.1 4.0 - 10.5 K/uL   RBC 3.00 (L) 4.22 - 5.81 MIL/uL   Hemoglobin 8.6 (L) 13.0 - 17.0 g/dL   HCT 26.9 (L) 39.0 - 52.0 %   MCV 89.7 78.0 - 100.0 fL   MCH 28.7 26.0 - 34.0 pg   MCHC 32.0 30.0 - 36.0 g/dL   RDW 17.1 (H) 11.5 - 15.5 %   Platelets 105 (L) 150 - 400 K/uL  Protime-INR     Status: Abnormal   Collection Time: 07/26/17  4:09 AM  Result Value Ref Range   Prothrombin Time 24.5 (H) 11.4 - 15.2 seconds   INR 2.17   .Cooxemetry Panel (carboxy, met, total hgb, O2 sat)     Status: Abnormal   Collection Time: 07/26/17  4:17 AM  Result Value Ref Range   Total hemoglobin 8.8 (L) 12.0 - 16.0 g/dL   O2 Saturation 71.2 %   Carboxyhemoglobin 2.4 (H) 0.5 - 1.5 %   Methemoglobin 1.0 0.0 - 1.5 %  Glucose, capillary     Status: None   Collection Time: 07/26/17  7:56 AM  Result Value Ref Range   Glucose-Capillary 82 65 - 99 mg/dL   Comment 1 Notify RN    Comment 2 Document in Chart   Hepatic function panel     Status: Abnormal   Collection Time: 07/26/17  9:30 AM  Result Value Ref Range   Total Protein 6.6 6.5 -  8.1 g/dL   Albumin 1.8 (L) 3.5 - 5.0 g/dL   AST 22 15 - 41 U/L   ALT <5 (L) 17 - 63 U/L   Alkaline Phosphatase 102 38 - 126 U/L   Total Bilirubin 1.2 0.3 - 1.2 mg/dL   Bilirubin, Direct 0.4 0.1 - 0.5 mg/dL   Indirect Bilirubin 0.8 0.3 - 0.9 mg/dL   No results found.  Assessment/Plan: Diagnosis: Severe debility related to respirator failure, CHF, MSSA sepsis 1. Does the need for close, 24 hr/day medical supervision in concert with the patient's rehab needs make it unreasonable for this patient to be  served in a less intensive setting? Yes 2. Co-Morbidities requiring supervision/potential complications: ICM, CKD4, afib, ACD 3. Due to bladder management, bowel management, safety, skin/wound care, disease management, medication administration, pain management and patient education, does the patient require 24 hr/day rehab nursing? Yes 4. Does the patient require coordinated care of a physician, rehab nurse, PT (1-2 hrs/day, 5 days/week), OT (1-2 hrs/day, 5 days/week) and SLP (1-2 hrs/day, 5 days/week) to address physical and functional deficits in the context of the above medical diagnosis(es)? Yes Addressing deficits in the following areas: balance, endurance, locomotion, strength, transferring, bowel/bladder control, bathing, dressing, feeding, grooming, toileting, cognition and psychosocial support 5. Can the patient actively participate in an intensive therapy program of at least 3 hrs of therapy per day at least 5 days per week? Yes 6. The potential for patient to make measurable gains while on inpatient rehab is excellent 7. Anticipated functional outcomes upon discharge from inpatient rehab are supervision and min assist  with PT, supervision and min assist with OT, modified independent and supervision with SLP. 8. Estimated rehab length of stay to reach the above functional goals is: 18-25 days 9. Anticipated D/C setting: Home 10. Anticipated post D/C treatments: HH therapy and Outpatient therapy 11. Overall Rehab/Functional Prognosis: excellent  RECOMMENDATIONS: This patient's condition is appropriate for continued rehabilitative care in the following setting: CIR Patient has agreed to participate in recommended program. Yes Note that insurance prior authorization may be required for reimbursement for recommended care.  Comment: Rehab Admissions Coordinator to follow up.  Thanks,  Meredith Staggers, MD, [place] [facility] 07/26/2017    Cathlyn Parsons., PA-C 07/26/2017

## 2017-07-26 NOTE — Care Management (Signed)
CIR recommendation and consult requested 8/11  Pt transferred from [facility] to [place] on 8/19  CM requested CIR consult from attending 8/21,  CSW following for back up plan

## 2017-07-26 NOTE — NC FL2 (Signed)
[place] LEVEL OF CARE SCREENING TOOL     IDENTIFICATION  Patient Name: [name] Birthdate: 10/21/1956 Sex: male Admission Date (Current Location): 07/09/2017  [facility] and [place] Number:  [employer] and Address:  [facility] [place]. [facility], [place] [address], [place], [place] [address]      Provider Number: [id number]  Attending Physician Name and Address:  Hosie Poisson, MD  Relative Name and Phone Number:       Current Level of Care: Hospital Recommended Level of Care: [place] Prior Approval Number:    Date Approved/Denied:   PASRR Number: [id number]  Discharge Plan: SNF    Current Diagnoses: Patient Active Problem List   Diagnosis Date Noted  . 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)   . Septic hip ([place])   . Staphylococcus aureus bacteremia with sepsis ([place])   . Severe sepsis with septic shock ([place])   . Cardiogenic shock ([place]) 07/10/2017  . Septic shock ([place]) 07/10/2017  . Cardiorenal syndrome with renal failure   . Thrombocytopenia ([place]) 06/14/2017  . GI bleed 04/27/2017  . Portal hypertensive gastropathy ([place]) 04/27/2017  . Hypokalemia 04/27/2017  . Esophageal dysphagia 04/13/2017  . Hepatic cirrhosis ([place]) 02/24/2017  . Acute on chronic combined systolic and diastolic congestive heart failure ([place])   . Biliary disease with obstruction   . AKI (acute kidney injury) ([place])   . Abnormal LFTs 05/10/2016  . Pain in the chest   . Right heart failure ([place]) 09/26/2015  . History of colonic polyps 09/24/2015  . Chronic atrial fibrillation ([place])   . Bleeding from colostomy stoma ([place]) 08/23/2015  . Anemia of chronic disease 07/21/2015  . Constipation 05/01/2015  . Transaminitis 05/01/2015  . NSTEMI- 04/23/15- Myoview scar- no cath 04/27/2015  . Acute respiratory failure ([place]) 04/23/2015  . Chronic cholecystitis 04/04/2015  . Cystitis with hematuria-April 2016 04/04/2015  . Chronically elevated transaminase  level-Amiodarone resumed 04/24/15 04/03/2015  . H/O ventricular fibrillation-March 2016 02/24/2015  . BiV ICD (St Jude) gen change 04/10/15 02/24/2015  . Hyperlipidemia   . Dietary noncompliance   . Orthostatic hypotension 07/19/2014  . OSA on CPAP   . GERD (gastroesophageal reflux disease) 06/05/2014  . Gallbladder polyp 03/06/2014  . DM type 2, uncontrolled, with renal complications ([place]) 85/88/5027  . Dizziness 01/26/2014  . Obesity 12/12/2013  . Chronic hypotension 12/11/2013  . Chronic kidney disease, stage IV (severe) ([place]) 12/11/2013  . Chronic a-fib ([place])   . CAD with LCX disease and stents to LAD 07/10/2013  . Long term current use of anticoagulant therapy 03/23/2013  . History of rectal cancer   . Ischemic cardiomyopathy-EF 35% 04/24/15 echo     Orientation RESPIRATION BLADDER Height & Weight     Self, Time, Situation, Place  Normal Continent, Indwelling catheter Weight: 181 lb 14.1 oz (82.5 kg) Height:  5\' 10"  (177.8 cm)  BEHAVIORAL SYMPTOMS/MOOD NEUROLOGICAL BOWEL NUTRITION STATUS   (None)  (None) Continent Diet (Heart healthy/carb modified)  AMBULATORY STATUS COMMUNICATION OF NEEDS Skin   Extensive Assist Verbally Other (Comment) (Blister, Excoriated, Skin tear. Venous stasis ulcers: Lower right leg and lower left leg (Impregnated gauze (bismuth) and Gauze (Kerlix) daily for both))                       Personal Care Assistance Level of Assistance  Bathing, Feeding, Dressing Bathing Assistance: Maximum assistance Feeding assistance: Limited assistance Dressing Assistance: Maximum assistance     Functional Limitations Info  Sight,  Hearing, Speech Sight Info: Adequate Hearing Info: Adequate Speech Info: Adequate    SPECIAL CARE FACTORS FREQUENCY  PT (By licensed PT), OT (By licensed OT), Blood pressure     PT Frequency: 5 x week OT Frequency: 5 x week            Contractures Contractures Info: Not present    Additional Factors Info  Code Status,  Allergies Code Status Info: Full Allergies Info: NKDA           Current Medications (07/26/2017):  This is the current hospital active medication list Current Facility-Administered Medications  Medication Dose Route Frequency Provider Last Rate Last Dose  . 0.9 %  sodium chloride infusion  250 mL Intravenous PRN Scatliffe, Rise Paganini, MD 10 mL/hr at 07/24/17 1900 250 mL at 07/24/17 1900  . acetaminophen (TYLENOL) tablet 650 mg  650 mg Oral Q4H PRN Buford Dresser, MD      . alum & mag hydroxide-simeth (MAALOX/MYLANTA) 200-200-20 MG/5ML suspension 15 mL  15 mL Oral Q6H PRN Buford Dresser, MD   15 mL at 07/13/17 1159  . ceFAZolin (ANCEF) IVPB 2g/100 mL premix  2 g Intravenous Q12H Rumbarger, Valeda Malm, RPH   Stopped at 07/26/17 0548  . colchicine tablet 0.3 mg  0.3 mg Oral Daily Scatliffe, Kristen D, MD   0.3 mg at 07/26/17 0912  . [START ON 07/27/2017] Darbepoetin Alfa (ARANESP) injection 200 mcg  200 mcg Subcutaneous Q Wed-1800 Deterding, James, MD      . fentaNYL (SUBLIMAZE) injection 25 mcg  25 mcg Intravenous Q2H PRN Scatliffe, Rise Paganini, MD   25 mcg at 07/25/17 2127  . furosemide (LASIX) tablet 160 mg  160 mg Oral TID Mauricia Area, MD      . HYDROcodone-acetaminophen (NORCO) 7.5-325 MG per tablet 1-2 tablet  1-2 tablet Oral Q4H PRN Laverle Hobby, MD   2 tablet at 07/24/17 0756  . insulin aspart (novoLOG) injection 0-5 Units  0-5 Units Subcutaneous QHS [name], PA-C   3 Units at 07/17/17 2142  . insulin aspart (novoLOG) injection 0-9 Units  0-9 Units Subcutaneous TID WC Scatliffe, Rise Paganini, MD   1 Units at 07/22/17 1300  . insulin aspart (novoLOG) injection 3 Units  3 Units Subcutaneous TID WC Desai, Rahul P, PA-C   3 Units at 07/25/17 1256  . insulin glargine (LANTUS) injection 10 Units  10 Units Subcutaneous QHS Javier Glazier, MD   10 Units at 07/25/17 2126  . magic mouthwash w/lidocaine  5 mL Oral TID PRN Clegg, Amy D, NP   5 mL at 07/11/17 1103  .  metolazone (ZAROXOLYN) tablet 10 mg  10 mg Oral Daily Deterding, Jeneen Rinks, MD   10 mg at 07/26/17 0912  . midodrine (PROAMATINE) tablet 10 mg  10 mg Oral TID WC Mauricia Area, MD   10 mg at 07/26/17 0912  . ondansetron (ZOFRAN) injection 4 mg  4 mg Intravenous Q6H PRN Buford Dresser, MD   4 mg at 07/15/17 1524  . sodium chloride flush (NS) 0.9 % injection 10-40 mL  10-40 mL Intracatheter Q12H Larey Dresser, MD   10 mL at 07/26/17 0916  . sodium chloride flush (NS) 0.9 % injection 10-40 mL  10-40 mL Intracatheter PRN Larey Dresser, MD   10 mL at 07/21/17 2122  . warfarin (COUMADIN) tablet 1 mg  1 mg Oral ONCE-1800 Hosie Poisson, MD      . Warfarin - Pharmacist Dosing Inpatient   Does not apply [phone]  Pat Patrick, [facility]         Discharge Medications: Please see discharge summary for a list of discharge medications.  Relevant Imaging Results:  Relevant Lab Results:   Additional Information SS#: [id number]  Candie Chroman, LCSW

## 2017-07-26 NOTE — Progress Notes (Signed)
Physical Therapy Treatment Patient Details Name: [name] MRN: [id number] DOB: September 08, 1956 Today's Date: 07/26/2017    History of Present Illness 61 yr old male with extensive PMHx MI, ICM, HFrEF EF15%-20% ,BiVICD, CKD Stage IV, DM, GERD, OSA on CPAP, PAF, presents with weight gain, lower extremity swelling and decreased urine output within the last week.   Work up included AOCsHF/cardiogenic shock, BiV MSSA, demand ischemia.  Pt continues on CRRT.    PT Comments    Pt admitted with above diagnosis. Pt currently with functional limitations due to balance and endurance deficits. Pt was able to stand to [place] 6 x with min guard to min assist of 2 people for safety.   Pt tolerated well.  Did alert MD that pt limited by right leg pain and he is consulting ortho.  Will follow.  Pt will benefit from skilled PT to increase their independence and safety with mobility to allow discharge to the venue listed below.     Follow Up Recommendations  CIR;Supervision/Assistance - 24 hour     Equipment Recommendations  Other (comment) (TBA)    Recommendations for Other Services Rehab consult     Precautions / Restrictions Precautions Precautions: Fall Precaution Comments: Watch BP Restrictions Weight Bearing Restrictions: No    Mobility  Bed Mobility Overal bed mobility: Needs Assistance Bed Mobility: Sit to Supine;Supine to Sit     Supine to sit: Mod assist Sit to supine: +2 for physical assistance;Mod assist   General bed mobility comments: Initially assist to come to EOB needing assist to elevate trunk and for LE assist.  Also assist to lower trunk and to lift LEs onto bed   Transfers Overall transfer level: Needs assistance Equipment used: 2 person hand held assist;Ambulation equipment used Transfers: Sit to/from Stand Sit to Stand: Mod assist;Min assist;+2 safety/equipment;From elevated surface         General transfer comment: Pt reports signficant Rt hip pain but willing  to try to stand to [facility].  Pt stood to [facility] 6 times - 1 min, 1 1/2 min, 30 seconds, 20 seconds, 1 min 20 seconds and 30 seconds.  MD came in when pt standing and MD was made aware of pts right hip pain liimiting his mobility recently and MD to make ORtho consult.  Pt did tolerate standing well with STedy.  Did place a pillow at knees for comfort.    Ambulation/Gait                 Stairs            Wheelchair Mobility    Modified Rankin (Stroke Patients Only)       Balance Overall balance assessment: Needs assistance Sitting-balance support: Feet supported;No upper extremity supported Sitting balance-Leahy Scale: Fair Sitting balance - Comments: able to maintain static sitting EOB with min guard assist    Standing balance support: Bilateral upper extremity supported Standing balance-Leahy Scale: Poor Standing balance comment: requires min guard A and bil UE support with STedy for static stance.                            Cognition Arousal/Alertness: Awake/alert Behavior During Therapy: WFL for tasks assessed/performed Overall Cognitive Status: Within Functional Limits for tasks assessed Area of Impairment: Following commands;Problem solving;Awareness                       Following Commands: Follows one step commands consistently  Awareness: Emergent Problem Solving: Slow processing General Comments: WFL for basic info       Exercises Low Level/ICU Exercises Ankle Circles/Pumps: AROM;Both;10 reps;Supine Quad Sets: AROM;Both;5 reps;Supine Other Exercises Other Exercises: LAQ sitting EOB x 5    General Comments General comments (skin integrity, edema, etc.): VSS      Pertinent Vitals/Pain Pain Assessment: Faces Faces Pain Scale: Hurts whole lot Pain Location: R hip with mobility/weightbearing Pain Descriptors / Indicators: Aching;Grimacing;Guarding Pain Intervention(s): Limited activity within patient's tolerance;Monitored during  session;Premedicated before session;Repositioned    Home Living                      Prior Function            PT Goals (current goals can now be found in the care plan section) Progress towards PT goals: Progressing toward goals    Frequency    Min 3X/week      PT Plan Current plan remains appropriate    Co-evaluation              AM-PAC PT "6 Clicks" Daily Activity  Outcome Measure  Difficulty turning over in bed (including adjusting bedclothes, sheets and blankets)?: Unable Difficulty moving from lying on back to sitting on the side of the bed? : Unable Difficulty sitting down on and standing up from a chair with arms (e.g., wheelchair, bedside commode, etc,.)?: Unable Help needed moving to and from a bed to chair (including a wheelchair)?: A Lot Help needed walking in hospital room?: A Lot Help needed climbing 3-5 steps with a railing? : A Lot 6 Click Score: 9    End of Session Equipment Utilized During Treatment: Gait belt Antony Salmon) Activity Tolerance: Patient limited by fatigue Patient left: with call bell/phone within reach;in bed;with family/visitor present Nurse Communication: Mobility status;Need for lift equipment Antony Salmon) PT Visit Diagnosis: Other abnormalities of gait and mobility (R26.89);Muscle weakness (generalized) (M62.81);Pain Pain - Right/Left: Right Pain - part of body: Hip     Time: 8416-6063 PT Time Calculation (min) (ACUTE ONLY): 29 min  Charges:  $Therapeutic Exercise: 8-22 mins $Therapeutic Activity: 8-22 mins                    G Codes:       [name],PT Acute Rehabilitation [phone] (pager)    [name] 07/26/2017, 11:46 AM

## 2017-07-26 NOTE — Progress Notes (Signed)
Pt. with 12-beat run unsustained Vtach, asymptomatic, MD notified

## 2017-07-26 NOTE — Clinical Social Work Placement (Signed)
   CLINICAL SOCIAL WORK PLACEMENT  NOTE  Date:  07/26/2017  Patient Details  Name: [name] MRN: [id number] Date of Birth: [birth year]  Clinical Social Work is seeking post-discharge placement for this patient at the [place] level of care (*CSW will initial, date and re-position this form in  chart as items are completed):  Yes   Patient/family provided with [place] Work Department's list of facilities offering this level of care within the geographic area requested by the patient (or if unable, by the patient's family).  Yes   Patient/family informed of their freedom to choose among providers that offer the needed level of care, that participate in Medicare, Medicaid or managed care program needed by the patient, have an available bed and are willing to accept the patient.  Yes   Patient/family informed of [facility]'s ownership interest in [facility] and [facility], as well as of the fact that they are under no obligation to receive care at these facilities.  PASRR submitted to EDS on 07/26/17     PASRR number received on       Existing PASRR number confirmed on 07/26/17     FL2 transmitted to all facilities in geographic area requested by pt/family on 07/26/17     FL2 transmitted to all facilities within larger geographic area on       Patient informed that his/her managed care company has contracts with or will negotiate with certain facilities, including the following:            Patient/family informed of bed offers received.  Patient chooses bed at       Physician recommends and patient chooses bed at      Patient to be transferred to   on  .  Patient to be transferred to facility by       Patient family notified on   of transfer.  Name of family member notified:        PHYSICIAN Please sign FL2     Additional Comment:    _______________________________________________ Candie Chroman, LCSW 07/26/2017,  11:31 AM

## 2017-07-26 NOTE — Progress Notes (Signed)
ANTICOAGULATION CONSULT NOTE - Follow Up Consult  Pharmacy Consult for Heparin > warfarin Indication: atrial fibrillation  No Known Allergies  Patient Measurements: Height: 5\' 10"  (177.8 cm) Weight: 181 lb 14.1 oz (82.5 kg) IBW/kg (Calculated) : 73  Vital Signs: Temp: 97.6 F (36.4 C) (08/21 0343) Temp Source: Oral (08/21 0343) BP: 92/57 (08/21 0823) Pulse Rate: 76 (08/21 0823)  Labs:  Recent Labs  07/24/17 0336 07/24/17 0500 07/24/17 1334 07/25/17 0351 07/26/17 0409  HGB 8.9*  --   --  9.0* 8.6*  HCT 27.8*  --   --  27.9* 26.9*  PLT 115*  --   --  124* 105*  LABPROT 17.8*  --   --  18.8* 24.5*  INR 1.45  --   --  1.55 2.17  HEPARINUNFRC  --  0.21* 0.38 0.48 0.52  CREATININE  --  2.84*  --  3.13* 3.21*    Estimated Creatinine Clearance: 24.6 mL/min (A) (by C-G formula based on SCr of 3.21 mg/dL (H)).   Assessment: 61yo male with Afib on heparin while Coumadin on hold. Heparin dosing weight is 90.7 kg.   Heparin level today 0.52 at  goal range heparin drip rate 1650 uts/hr.  No bleeding or complications noted.  CBC stable.  INR 1.55> bump 2.17 now at goal  after restarting warfarin this week.  CRRT discontinued 8/16. Scr changed from 1.79>3.3 with some uop since discontinuation.  Goal of Therapy:  Heparin level 0.3-0.7 units/ml  INR 2-3 Monitor platelets by anticoagulation protocol: Yes   Plan:  Stop heparin Warfarin 1mg  x1 Daily  Protime  Bonnita Nasuti Pharm.D. CPP, BCPS Clinical Pharmacist [phone] 07/26/2017 9:20 AM

## 2017-07-27 ENCOUNTER — Inpatient Hospital Stay (HOSPITAL_COMMUNITY): Payer: Medicare Other

## 2017-07-27 HISTORY — PX: IR US GUIDE VASC ACCESS RIGHT: IMG2390

## 2017-07-27 HISTORY — PX: IR FLUORO GUIDE CV LINE RIGHT: IMG2283

## 2017-07-27 LAB — COOXEMETRY PANEL
Carboxyhemoglobin: 2.8 % — ABNORMAL HIGH (ref 0.5–1.5)
METHEMOGLOBIN: 2.6 % — AB (ref 0.0–1.5)
O2 Saturation: 98.4 %
Total hemoglobin: 8.7 g/dL — ABNORMAL LOW (ref 12.0–16.0)

## 2017-07-27 LAB — RENAL FUNCTION PANEL
ALBUMIN: 1.7 g/dL — AB (ref 3.5–5.0)
Anion gap: 10 (ref 5–15)
BUN: 55 mg/dL — AB (ref 6–20)
CHLORIDE: 98 mmol/L — AB (ref 101–111)
CO2: 23 mmol/L (ref 22–32)
CREATININE: 3.44 mg/dL — AB (ref 0.61–1.24)
Calcium: 8.4 mg/dL — ABNORMAL LOW (ref 8.9–10.3)
GFR, EST AFRICAN AMERICAN: 20 mL/min — AB (ref >60)
GFR, EST NON AFRICAN AMERICAN: 18 mL/min — AB (ref >60)
Glucose, Bld: 104 mg/dL — ABNORMAL HIGH (ref 65–99)
PHOSPHORUS: 6 mg/dL — AB (ref 2.5–4.6)
Potassium: 3.4 mmol/L — ABNORMAL LOW (ref 3.5–5.1)
Sodium: 131 mmol/L — ABNORMAL LOW (ref 135–145)

## 2017-07-27 LAB — GLUCOSE, CAPILLARY
GLUCOSE-CAPILLARY: 103 mg/dL — AB (ref 65–99)
GLUCOSE-CAPILLARY: 136 mg/dL — AB (ref 65–99)
GLUCOSE-CAPILLARY: 144 mg/dL — AB (ref 65–99)
Glucose-Capillary: 92 mg/dL (ref 65–99)

## 2017-07-27 LAB — MAGNESIUM: MAGNESIUM: 1.9 mg/dL (ref 1.7–2.4)

## 2017-07-27 LAB — CBC
HEMATOCRIT: 27.2 % — AB (ref 39.0–52.0)
HEMOGLOBIN: 8.7 g/dL — AB (ref 13.0–17.0)
MCH: 29.3 pg (ref 26.0–34.0)
MCHC: 32 g/dL (ref 30.0–36.0)
MCV: 91.6 fL (ref 78.0–100.0)
Platelets: 105 10*3/uL — ABNORMAL LOW (ref 150–400)
RBC: 2.97 MIL/uL — AB (ref 4.22–5.81)
RDW: 17.6 % — ABNORMAL HIGH (ref 11.5–15.5)
WBC: 6.7 10*3/uL (ref 4.0–10.5)

## 2017-07-27 LAB — PROTIME-INR
INR: 2.75
PROTHROMBIN TIME: 29.7 s — AB (ref 11.4–15.2)

## 2017-07-27 MED ORDER — HEPARIN SODIUM (PORCINE) 1000 UNIT/ML IJ SOLN
INTRAMUSCULAR | Status: AC
  Filled 2017-07-27: qty 1

## 2017-07-27 MED ORDER — POTASSIUM CHLORIDE CRYS ER 20 MEQ PO TBCR
40.0000 meq | EXTENDED_RELEASE_TABLET | Freq: Two times a day (BID) | ORAL | Status: AC
  Administered 2017-07-27 (×2): 40 meq via ORAL
  Filled 2017-07-27 (×2): qty 2

## 2017-07-27 MED ORDER — WARFARIN 0.5 MG HALF TABLET
0.5000 mg | ORAL_TABLET | Freq: Once | ORAL | Status: AC
  Administered 2017-07-27: 0.5 mg via ORAL
  Filled 2017-07-27 (×3): qty 1

## 2017-07-27 MED ORDER — LIDOCAINE HCL (PF) 1 % IJ SOLN
INTRAMUSCULAR | Status: DC | PRN
  Administered 2017-07-27: 5 mL

## 2017-07-27 MED ORDER — LIDOCAINE HCL (PF) 1 % IJ SOLN
INTRAMUSCULAR | Status: AC
  Filled 2017-07-27: qty 30

## 2017-07-27 NOTE — Progress Notes (Signed)
Patient ID: [name], male   DOB: 20-Aug-1956, 62 y.o.   MRN: [id number]          [facility] for Infectious Disease  Date of Admission:  07/09/2017           Day 18 cefazolin ASSESSMENT: His MSSA bacteremia is responding well to cefazolin therapy. Plan is to complete 6 weeks of IV cefazolin then convert to chronic, suppressive oral cephalexin therapy.  PLAN: 1. Continue cefazolin 24 more days 2. I will ask IR to place a double-lumen central line 3. I will sign off now but arrange follow-up in my clinic in about 3 weeks  Diagnosis: MSSA bacteremia  Culture Result: MSSA  No Known Allergies  OPAT Orders Discharge antibiotics: Per pharmacy protocol cefazolin  Duration: 6 weeks End Date: Stop IV cefazolin on 08/20/2017 then start renally dosed oral cephalexin, probably 250 mg twice daily  PIC Care Per Protocol:  Labs weekly while on IV antibiotics: _x_ CBC with differential _x_ BMP __ CMP __ CRP __ ESR __ Vancomycin trough  __ Please pull PIC at completion of IV antibiotics _x_ Please leave PIC in place until doctor has seen patient or been notified  Fax weekly labs to [phone]  Clinic Follow Up Appt: I will arrange follow-up in my clinic.    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 colchicine  0.3 mg Oral Daily  . darbepoetin (ARANESP) injection - NON-DIALYSIS  200 mcg Subcutaneous Q Wed-1800  .  furosemide  160 mg Oral TID  . insulin aspart  0-5 Units Subcutaneous QHS  . insulin aspart  0-9 Units Subcutaneous TID WC  . insulin aspart  3 Units Subcutaneous TID WC  . insulin glargine  10 Units Subcutaneous QHS  . metolazone  10 mg Oral Daily  . midodrine  10 mg Oral TID WC  . potassium chloride  40 mEq Oral BID  . sodium chloride flush  10-40 mL Intracatheter Q12H  . warfarin  0.5 mg Oral ONCE-1800  . Warfarin - Pharmacist Dosing Inpatient   Does not apply q1800    SUBJECTIVE: He is better.  Review of Systems: Review of Systems  Constitutional: Negative for chills, diaphoresis and fever.  Gastrointestinal: Negative for abdominal pain, diarrhea, nausea and vomiting.    No Known Allergies  OBJECTIVE: Vitals:   07/27/17 0027 07/27/17 0320 07/27/17 0725 07/27/17 1120  BP: (!) 91/51 98/60 (!) 95/56 (!) 89/54  Pulse: 73 79 73 77  Resp: '10 11 15 12  '$ Temp: (!) 97.5 F (36.4 C) 97.8 F (36.6 C) 97.8 F (36.6 C) 97.9 F (36.6 C)  TempSrc: Oral Oral Oral Oral  SpO2: 99% 96% 97% 95%  Weight:  181 lb (82.1 kg)    Height:       Body mass index is 25.97 kg/m.  Physical Exam  Constitutional:  He is resting quietly  in bed.  Cardiovascular: Normal rate and regular rhythm.   Murmur heard. 2/6 systolic murmur.  Pulmonary/Chest: Effort normal and breath sounds normal.    Lab Results Lab Results  Component Value Date   WBC 6.7 07/27/2017   HGB 8.7 (L) 07/27/2017   HCT 27.2 (L) 07/27/2017   MCV 91.6 07/27/2017   PLT 105 (L) 07/27/2017    Lab Results  Component Value Date   CREATININE 3.44 (H) 07/27/2017   BUN 55 (H) 07/27/2017   NA 131 (L) 07/27/2017   K 3.4 (L) 07/27/2017   CL 98 (L) 07/27/2017   CO2 23 07/27/2017    Lab Results  Component Value Date   ALT <5 (L) 07/26/2017   AST 22 07/26/2017   ALKPHOS 102 07/26/2017   BILITOT 1.2 07/26/2017     Microbiology: No results found for this or any previous visit (from the past 240 hour(s)).  Michel Bickers,  MD [facility] for Infectious [place] [facility] [phone] pager   [phone] cell 07/27/2017, 12:47 PM

## 2017-07-27 NOTE — Progress Notes (Signed)
PROGRESS NOTE    [name]  IHK:[id number] DOB: Mar 11, 1956 DOA: 07/09/2017 PCP: Elfredia Nevins, MD    Brief Narrative:  61 y.o.male with history of ischemic cardiomyopathy status post ICD placement. Also history of chronic renal failure. Admitted on 8/4 with acute respiratory failure secondary to pulmonary edema. Subsequently found to have a MSSA bacteremia. Patient has had persistent shock requiring vasopressor support and is on renal replacement therapy. He was initially admitted to [facility] and transferred to [facility] service on 8/20.  8/21 no new issues, worked with PT, requested CIR consult.    Assessment & Plan:   Principal Problem:   Staphylococcus aureus bacteremia with sepsis [facility]) Active Problems:   Ischemic cardiomyopathy-EF 35% 04/24/15 echo   Long term current use of anticoagulant therapy   CAD with LCX disease and stents to LAD   Chronic a-fib (HCC)   Chronic kidney disease, stage IV (severe) (HCC)   OSA on CPAP   Hyperlipidemia   BiV ICD (St Jude) gen change 04/10/15   Chronic atrial fibrillation (HCC)   Right heart failure (HCC)   Septic shock (HCC)   Cardiorenal syndrome with renal failure   Infected defibrillator (HCC)   Septic hip (HCC)   Severe sepsis with septic shock (HCC)  #1 MSSA bacteremia/sepsis Patient with some clinical improvement. Afebrile. Patient with a normal white count. Repeat blood cultures from 07/11/2017 negative to date.TEE 07/19/2017 negative for endocarditis or vegetation on valves/ICD. ID and place an order for double-lumen central line. Interventional radiologyfor 6 weeks of IV antibiotics and then converted to chronic supssive oral cephalexin therapy per ID recommendations. Outpatient follow-up with ID. ID following and appreciate input and recommendations.  #2 acute on chronic systolic heart failure/cardiogenic shock 2-D echo from August 2018 with a EF of 15-20%.atient with low blood pressure with systolics in the 80s to 90s Decreased blood  pressure felt likely to be secondary to sepsis and cardiogenic shock. Patient clinically volume overloaded. Patient unable to place on a beta blocker secondary to acute decompensation. Patient with worsening renal function and cannot be placed on a ACE inhibitor or ARB. Patient currentlyon Lasix 160 mg orally3 times daily and Zaroxolyn 10 mg daily. Midrin dose hs been increased to 10mg  TID.Per cardiology.  #3 acute on chronic kidney disease stage IV Patient did receive CVVHD during this hospitalization which has subsequently been discontinued.patient with a urine put of 1.6 L over the past 24 hours.patient with low blood pressure. Continue or Midrin dose has been ncreased due to low blood pressure. Nephrology following.  #4 cirrhosis Per CT abdomen and pelvis.Patient currently on diuretics. Outpatient follow-up.  #5 DM Check a hemoglobin A1c. CBGs ranging from 92-136. Continue Lantus and sliding scale insulin.  #6 history of gout Stable. Continue culture sent.  #7 anemia Likely secondary to chronic kidney disease. S/p RBC transfusion x 2 on 8/17. Patient receiving Aranesp.  #8 chronic atrial fibrillation Currently rate controlled. Coumadin for anticoagulation. Cardiology following.  #9 H/O rectal CA with diverting colostomy Outpatient follow-up.  DVT prophylaxis: coumadin Code Status: full Family Communication: updated patient. No family present. Disposition Plan: CIR versus skilled nursing facility.   Consultants:   CIR: Dr. Hermelinda Medicus 07/26/2017   electrophysiology: Dr. Johney Frame 07/14/2017  Cardiology/heart failure Dr.Qureshi 07/10/2017  Nephrology: Dr. Eliott Nine 07/11/2017  Infectious disease: Dr. Daiva Eves 07/11/2017  Procedures: TEE 8/14: EF 20% with diffuse hypokinesis. Moderate hypokinesis of RV. ICD/pacing wires noted without vegetation. Left atrium dilated without thrombus. Right atrium dilated and pacing wires without vegetation.  Mild aortic insufficiency without vegetation.  Mild mitral regurgitation without vegetation. Moderate to severe tricuspid regurgitation without vegetation. Pulmonic valve without vegetation. No PFO or ASD. No pericardial effusion. CT right hip 07/11/2017 CT abdomen and pelvis 07/10/2017 2-D echo 07/10/2017 Transfused 2 units packed red blood cells 07/22/2017   SIGNIFICANT EVENTS: 08/04 - Admit 08/05 - Started on CVVHD 08/15 - CVVHD stopped  8/20 - transfer to [facility] service.    Antimicrobials:   IV Ancef 07/10/2017   IV vancomycin 07/10/2017   Subjective: Patient states shortness of breath improved. No chest pain. Patient states good urine output.  Objective: Vitals:   07/27/17 0320 07/27/17 0725 07/27/17 1120 07/27/17 1520  BP: 98/60 (!) 95/56 (!) 89/54   Pulse: 79 73 77   Resp: 11 15 12    Temp: 97.8 F (36.6 C) 97.8 F (36.6 C) 97.9 F (36.6 C) (!) 97.5 F (36.4 C)  TempSrc: Oral Oral Oral Oral  SpO2: 96% 97% 95%   Weight: 82.1 kg (181 lb)     Height:        Intake/Output Summary (Last 24 hours) at 07/27/17 1934 Last data filed at 07/27/17 1547  Gross per 24 hour  Intake              530 ml  Output             2025 ml  Net            -1495 ml   Filed Weights   07/25/17 0321 07/26/17 0343 07/27/17 0320  Weight: 86.4 kg (190 lb 7.6 oz) 82.5 kg (181 lb 14.1 oz) 82.1 kg (181 lb)    Examination:  General exam: Appears calm and comfortable  Respiratory system: bibasilar crackles. Respiratory effort normal. Cardiovascular system: S1 & S2 heard, RRR. 3/6 SEM. 1+ bilateral pedal edema. Gastrointestinal system: Abdomen is nondistended, soft and nontender. No organomegaly or masses felt. Normal bowel sounds heard. Central nervous system: Alert and oriented. No focal neurological deficits. Extremities: Symmetric 5 x 5 power. Skin: chronic venous stasis changes on lower extremities. Bandages around bilateral shins. Psychiatry: Judgement and insight appear normal. Mood & affect appropriate.     Data Reviewed: I  have personally reviewed following labs and imaging studies  CBC:  Recent Labs Lab 07/23/17 0414 07/24/17 0336 07/25/17 0351 07/26/17 0409 07/27/17 0356  WBC 10.6* 10.2 8.6 7.1 6.7  HGB 8.9* 8.9* 9.0* 8.6* 8.7*  HCT 27.4* 27.8* 27.9* 26.9* 27.2*  MCV 89.3 90.3 91.2 89.7 91.6  PLT 118* 115* 124* 105* 105*   Basic Metabolic Panel:  Recent Labs Lab 07/23/17 0414 07/24/17 0336 07/24/17 0500 07/25/17 0351 07/26/17 0409 07/27/17 0356  NA 128*  --  127* 128* 128* 131*  K 4.3  --  4.0 3.7 3.5 3.4*  CL 96*  --  95* 96* 97* 98*  CO2 22  --  22 22 21* 23  GLUCOSE 80  --  104* 127* 92 104*  BUN 37*  --  42* 48* 53* 55*  CREATININE 2.66*  --  2.84* 3.13* 3.21* 3.44*  CALCIUM 8.1*  --  8.1* 7.9* 8.1* 8.4*  MG 2.1 1.9  --  1.9 1.9 1.9  PHOS 5.0*  --  5.1* 4.9* 5.2* 6.0*   GFR: Estimated Creatinine Clearance: 23 mL/min (A) (by C-G formula based on SCr of 3.44 mg/dL (H)). Liver Function Tests:  Recent Labs Lab 07/22/17 0803  07/24/17 0500 07/25/17 0351 07/26/17 0409 07/26/17 0930 07/27/17 0356  AST 25  --   --   --   --  22  --   ALT 5*  --   --   --   --  <5*  --   ALKPHOS 135*  --   --   --   --  102  --   BILITOT 1.3*  --   --   --   --  1.2  --   PROT 6.4*  --   --   --   --  6.6  --   ALBUMIN 1.9*  < > 1.7* 1.7* 1.6* 1.8* 1.7*  < > = values in this interval not displayed. No results for input(s): LIPASE, AMYLASE in the last 168 hours. No results for input(s): AMMONIA in the last 168 hours. Coagulation Profile:  Recent Labs Lab 07/23/17 0414 07/24/17 0336 07/25/17 0351 07/26/17 0409 07/27/17 0356  INR 1.48 1.45 1.55 2.17 2.75   Cardiac Enzymes: No results for input(s): CKTOTAL, CKMB, CKMBINDEX, TROPONINI in the last 168 hours. BNP (last 3 results) No results for input(s): PROBNP in the last 8760 hours. HbA1C: No results for input(s): HGBA1C in the last 72 hours. CBG:  Recent Labs Lab 07/26/17 1557 07/26/17 2137 07/27/17 0804 07/27/17 1122  07/27/17 1545  GLUCAP 125* 123* 92 103* 136*   Lipid Profile: No results for input(s): CHOL, HDL, LDLCALC, TRIG, CHOLHDL, LDLDIRECT in the last 72 hours. Thyroid Function Tests: No results for input(s): TSH, T4TOTAL, FREET4, T3FREE, THYROIDAB in the last 72 hours. Anemia Panel:  Recent Labs  07/25/17 1144  TIBC 225*  IRON 22*   Sepsis Labs: No results for input(s): PROCALCITON, LATICACIDVEN in the last 168 hours.  No results found for this or any previous visit (from the past 240 hour(s)).       Radiology Studies: No results found.      Scheduled Meds: . colchicine  0.3 mg Oral Daily  . darbepoetin (ARANESP) injection - NON-DIALYSIS  200 mcg Subcutaneous Q Wed-1800  . furosemide  160 mg Oral TID  . insulin aspart  0-5 Units Subcutaneous QHS  . insulin aspart  0-9 Units Subcutaneous TID WC  . insulin aspart  3 Units Subcutaneous TID WC  . insulin glargine  10 Units Subcutaneous QHS  . metolazone  10 mg Oral Daily  . midodrine  10 mg Oral TID WC  . potassium chloride  40 mEq Oral BID  . sodium chloride flush  10-40 mL Intracatheter Q12H  . warfarin  0.5 mg Oral ONCE-1800  . Warfarin - Pharmacist Dosing Inpatient   Does not apply q1800   Continuous Infusions: . sodium chloride 250 mL (07/24/17 1900)  .  ceFAZolin (ANCEF) IV Stopped (07/27/17 1806)     LOS: 17 days    Time spent: 40 minutes    Kendal Ghazarian, MD [facility] Pager [phone]  If 7PM-7AM, please contact night-coverage [email or web address] Password [facility] 07/27/2017, 7:34 PM

## 2017-07-27 NOTE — Progress Notes (Signed)
Patient ID: [name], male   DOB: 12-Feb-1956, 61 y.o.   MRN: [id number]     Advanced Heart Failure Rounding Note  Primary Cardiologist: Eyad Rochford  Subjective:    Admitted with staph bacteremia with mixed septic/cardiogenic shock.   TEE 07/19/17 with LVEF 20%. No evidence of endocarditis or vegetation on valves/ICD  CVVD stopped 07/20/17.   Yesterday midodrine was increased and he continued to diurese with IV lasix + metolazone. Weight unchanged. Creatinine trending up.   Denies SOB. Appetite improving.      Objective:   Weight Range: 181 lb (82.1 kg) Body mass index is 25.97 kg/m.   Vital Signs:   Temp:  [97.2 F (36.2 C)-97.8 F (36.6 C)] 97.8 F (36.6 C) (08/22 0320) Pulse Rate:  [69-82] 73 (08/22 0725) Resp:  [10-15] 15 (08/22 0725) BP: (91-100)/(51-65) 95/56 (08/22 0725) SpO2:  [96 %-100 %] 97 % (08/22 0725) Weight:  [181 lb (82.1 kg)] 181 lb (82.1 kg) (08/22 0320) Last BM Date: 07/26/17  Weight change: Filed Weights   07/25/17 0321 07/26/17 0343 07/27/17 0320  Weight: 190 lb 7.6 oz (86.4 kg) 181 lb 14.1 oz (82.5 kg) 181 lb (82.1 kg)    Intake/Output:   Intake/Output Summary (Last 24 hours) at 07/27/17 0752 Last data filed at 07/27/17 0547  Gross per 24 hour  Intake              290 ml  Output             1625 ml  Net            -1335 ml      Physical Exam   General:  Chronically ill.  No resp difficulty HEENT: normal Neck: supple. JVP to ear .RIJU HD cath. Carotids 2+ bilat; no bruits. No lymphadenopathy or thryomegaly appreciated. Cor: PMI nondisplaced. Irregular rate & rhythm. No rubs, gallops or murmurs. Lungs: clear Abdomen: soft, nontender, nondistended. No hepatosplenomegaly. No bruits or masses. Good bowel sounds. Extremities: no cyanosis, clubbing, rash, R and LLE 3+ edema Neuro: alert & orientedx3, cranial nerves grossly intact. moves all 4 extremities w/o difficulty. Affect pleasant     Telemetry   BiV pacing 70s. Had NSVT  overnight. Underlying rhythm A fib. Personally checked.   Labs    CBC  Recent Labs  07/26/17 0409 07/27/17 0356  WBC 7.1 6.7  HGB 8.6* 8.7*  HCT 26.9* 27.2*  MCV 89.7 91.6  PLT 105* 105*   Basic Metabolic Panel  Recent Labs  07/26/17 0409 07/27/17 0356  NA 128* 131*  K 3.5 3.4*  CL 97* 98*  CO2 21* 23  GLUCOSE 92 104*  BUN 53* 55*  CREATININE 3.21* 3.44*  CALCIUM 8.1* 8.4*  MG 1.9 1.9  PHOS 5.2* 6.0*   Liver Function Tests  Recent Labs  07/26/17 0930 07/27/17 0356  AST 22  --   ALT <5*  --   ALKPHOS 102  --   BILITOT 1.2  --   PROT 6.6  --   ALBUMIN 1.8* 1.7*   No results for input(s): LIPASE, AMYLASE in the last 72 hours. Cardiac Enzymes No results for input(s): CKTOTAL, CKMB, CKMBINDEX, TROPONINI in the last 72 hours.  BNP: BNP (last 3 results)  Recent Labs  06/13/17 1205 07/09/17 2151  BNP 1,151.0* 1,553.9*    ProBNP (last 3 results) No results for input(s): PROBNP in the last 8760 hours.   D-Dimer No results for input(s): DDIMER in the last 72 hours. Hemoglobin A1C No  results for input(s): HGBA1C in the last 72 hours. Fasting Lipid Panel No results for input(s): CHOL, HDL, LDLCALC, TRIG, CHOLHDL, LDLDIRECT in the last 72 hours. Thyroid Function Tests No results for input(s): TSH, T4TOTAL, T3FREE, THYROIDAB in the last 72 hours.  Invalid input(s): FREET3  Other results:   Imaging   No results found.  Medications:     Scheduled Medications: . colchicine  0.3 mg Oral Daily  . darbepoetin (ARANESP) injection - NON-DIALYSIS  200 mcg Subcutaneous Q Wed-1800  . furosemide  160 mg Oral TID  . insulin aspart  0-5 Units Subcutaneous QHS  . insulin aspart  0-9 Units Subcutaneous TID WC  . insulin aspart  3 Units Subcutaneous TID WC  . insulin glargine  10 Units Subcutaneous QHS  . metolazone  10 mg Oral Daily  . midodrine  10 mg Oral TID WC  . potassium chloride  40 mEq Oral BID  . sodium chloride flush  10-40 mL Intracatheter  Q12H  . Warfarin - Pharmacist Dosing Inpatient   Does not apply q1800    Infusions: . sodium chloride 250 mL (07/24/17 1900)  .  ceFAZolin (ANCEF) IV Stopped (07/27/17 0556)    PRN Medications: sodium chloride, acetaminophen, alum & mag hydroxide-simeth, fentaNYL (SUBLIMAZE) injection, HYDROcodone-acetaminophen, magic mouthwash w/lidocaine, ondansetron (ZOFRAN) IV, sodium chloride flush    Patient Profile   [name] is a 61 year old with a history of DMII, SAH 2016, rectal cancer, CKD Stage IV, chronic A fib, S/P AVN ablation, PAF, CAD BMS LAD 2011 BMS LAD 2009, OSA, chronic systolic heart failure, St Jude BiV ICD admitted with AKI and shock with elevated lactate, suspect cardiogenic.   Assessment/Plan   1. Acute on chronic systolic CHF/cardiogenic shock: Last echo in 8/17 with EF 15-20%.  Ischemic cardiomyopathy, has St Jude BiV ICD (despite chronic afib appears to be effectively BiV pacing).  Had recent admission (discharged 06/21/17) requiring milrinone support and diuresis.  Admitted with soft BP (SBP 80s-90s) + elevated lactate.  Suspect mixed septic/cardiogenic shock with MSSA bacteremia.  - CVVHD stopped 07/20/17. Marked volume overload. Weight unchanged.  -  No beta blocker with decompensation, hypotension.  - No ARB with AKI.  - Nephrology doubts he will tolerate iHD. Marland Kitchen   2. AKI on CKD stage 4:  - Creatinine continues to rise. 2.5>3.44.   - Discussed with Nephrology. Midodrine increased.   3. Atrial fibrillation: Chronic - Continue coumadin. No plans to extract device.  - INR 2.75, HF PharmD following.   4. CAD:  - Denies chest pain.  - Continue ASA. No statin with recent elevated LFT's    5. ID:  - Blood CX 2/2 MSSA. Antibiotics narrowed, now on Ancef. 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   - Not planning ICD extraction for now due to severe comorbidities. He is device dependent.  Once IV abx complete will plan suppressive abx. If recovers further  can reconsider device extraction. Timing of PICC per ID.  D/w EP - TEE 07/19/17 with no evidence of endocarditis.   - No change.   6. H/O Rectal CA with diverting colostomy   7. Type II diabetes:  - Continue sliding scale.   8. Liver failure:  LFts have been trending down.    9. Anemia of chronic disease. - s/p RBCs on 8/17.  - Todays hgb 8.7   10. Deconditioning-- PT/OT recommending CIR. CIR consulted. Possible admit if insurance approved.     Length of Stay: 17  Amy  Clegg, NP  07/27/2017, 7:52 AM  Advanced Heart Failure Team Pager [phone] (M-F; 7a - 4p)  Please contact [facility] for night-coverage after hours (4p -7a ) and weekends on [email or web address]  Patient seen and examined with Tonye Becket, NP. We discussed all aspects of the encounter. I agree with the assessment and plan as stated above.   Remains tenuous but now tolerating po lasix. Urine output 1.6L. Creatinine up slightly. Weight stable. Feels better. Was able to stand with PT help yesterday. Midodrine increased to 10 tid.  SBP remains in 90s.   Remains afebrile of Ancef.  Overall very tenuous. Is maintaining volume status on po lasix but not sure it will last, Appreciate nephrology input. Continue PT.   Arvilla Meres, MD  8:21 AM

## 2017-07-27 NOTE — Progress Notes (Signed)
Rehab admissions - I met with patient at the bedside.  I gave him rehab booklets and talked about inpatient rehab.  Patient would like to come to inpatient rehab prior to home with his wife.  I will await medical readiness and then plan to admit to inpatient rehab.  Call me for questions.  #[phone] 

## 2017-07-27 NOTE — Progress Notes (Signed)
ANTICOAGULATION CONSULT NOTE - Follow Up Consult  Pharmacy Consult for Warfarin Indication: atrial fibrillation  No Known Allergies  Patient Measurements: Height: 5\' 10"  (177.8 cm) Weight: 181 lb (82.1 kg) IBW/kg (Calculated) : 73  Vital Signs: Temp: 97.9 F (36.6 C) (08/22 1120) Temp Source: Oral (08/22 1120) BP: 89/54 (08/22 1120) Pulse Rate: 77 (08/22 1120)  Labs:  Recent Labs  07/24/17 1334  07/25/17 0351 07/26/17 0409 07/27/17 0356  HGB  --   < > 9.0* 8.6* 8.7*  HCT  --   --  27.9* 26.9* 27.2*  PLT  --   --  124* 105* 105*  LABPROT  --   --  18.8* 24.5* 29.7*  INR  --   --  1.55 2.17 2.75  HEPARINUNFRC 0.38  --  0.48 0.52  --   CREATININE  --   --  3.13* 3.21* 3.44*  < > = values in this interval not displayed.  Estimated Creatinine Clearance: 23 mL/min (A) (by C-G formula based on SCr of 3.44 mg/dL (H)).   Assessment: 61yo male with Afib, Coumadin restarted 8/19.  No bleeding or complications noted.  CBC stable.  INR rising quickly after 3 small doses of warfarin.  Goal of Therapy:  Heparin level 0.3-0.7 units/ml  INR 2-3 Monitor platelets by anticoagulation protocol: Yes   Plan:  Warfarin 0.5 mg x 1 tonight. Daily INR.  Uvaldo Rising, BCPS  Clinical Pharmacist Pager [phone]  07/27/2017 12:44 PM

## 2017-07-27 NOTE — Procedures (Signed)
RIJV tunelled PICC SVC RA 20 cm EBL 0 Comp 0

## 2017-07-27 NOTE — Progress Notes (Signed)
Pt had a 19 beat run of VTach. Pt asleep and asympotamatic. On call [facility] NP and cardiology fellow Fudim notified. Will continue to monitor.

## 2017-07-27 NOTE — Progress Notes (Signed)
Subjective: Interval History: has no complaint, wants up more.  Objective: Vital signs in last 24 hours: Temp:  [97.2 F (36.2 C)-97.8 F (36.6 C)] 97.8 F (36.6 C) (08/22 0320) Pulse Rate:  [69-82] 73 (08/22 0725) Resp:  [10-15] 15 (08/22 0725) BP: (91-100)/(51-65) 95/56 (08/22 0725) SpO2:  [96 %-100 %] 97 % (08/22 0725) Weight:  [82.1 kg (181 lb)] 82.1 kg (181 lb) (08/22 0320) Weight change: -0.4 kg (-14.1 oz)  Intake/Output from previous day: 08/21 0701 - 08/22 0700 In: 290 [P.O.:180; I.V.:10; IV Piggyback:100] Out: 1625 [Urine:1625] Intake/Output this shift: No intake/output data recorded.  General appearance: alert, cooperative, no distress and pale Resp: diminished breath sounds bilaterally and rales bibasilar Cardio: S1, S2 normal and systolic murmur: holosystolic 3/6, blowing at apex GI: pos bs, distended,  Extremities: edema 3+ and AVF RUA  GI ostomy mid abdm Pacer [facility]  Lab Results:  Recent Labs  07/26/17 0409 07/27/17 0356  WBC 7.1 6.7  HGB 8.6* 8.7*  HCT 26.9* 27.2*  PLT 105* 105*   BMET:  Recent Labs  07/26/17 0409 07/27/17 0356  NA 128* 131*  K 3.5 3.4*  CL 97* 98*  CO2 21* 23  GLUCOSE 92 104*  BUN 53* 55*  CREATININE 3.21* 3.44*  CALCIUM 8.1* 8.4*   No results for input(s): PTH in the last 72 hours. Iron Studies:  Recent Labs  07/25/17 1144  IRON 22*  TIBC 225*    Studies/Results: No results found.  I have reviewed the patient's current medications.  Assessment/Plan: 1 CKD 4 AKI diuresing on po tx.  Still vol xs.  Low bps 2 CM as above 3 Cirrhosis 4 MSSA sepsis on Ancef 5 DM Controlled 6 CAD 7 Anemia esa/Fe P diuretics, esa, mobilize, Ancef   LOS: 17 days   [name] L 07/27/2017,7:49 AM

## 2017-07-27 NOTE — Progress Notes (Signed)
Pharmacy Antibiotic Note  [name] is a 61 y.o. male admitted on 07/09/2017 with MSSA bacteremia.  Pharmacy has been consulted for cefazolin dosing.  CRRT discontinued.  Scr rising slowly now.  Hard to estimate CrCl.  Cefazolin 2 g q 12 hrs should be appropriate for now.  Plan: Continue cefazolin 2 g every 12 hours.  Monitor SCr and urine output off CRRT  Height: 5\' 10"  (177.8 cm) Weight: 181 lb (82.1 kg) IBW/kg (Calculated) : 73  Temp (24hrs), Avg:97.6 F (36.4 C), Min:97.2 F (36.2 C), Max:97.9 F (36.6 C)   Recent Labs Lab 07/23/17 0414 07/24/17 0336 07/24/17 0500 07/25/17 0351 07/26/17 0409 07/27/17 0356  WBC 10.6* 10.2  --  8.6 7.1 6.7  CREATININE 2.66*  --  2.84* 3.13* 3.21* 3.44*    Estimated Creatinine Clearance: 23 mL/min (A) (by C-G formula based on SCr of 3.44 mg/dL (H)).    No Known Allergies  Antimicrobials this admission: Vanc 8/4 x1 Zosyn 8/4>> 8/5 Cefazolin 8/5 >>(9/16) -6weeks ancef then chronic keflex - determine time PICC line placement Rifampin 8/5 >>8/8 (d/c'd d/t elevated INR and hyperbilirubinemia)   Dose adjustments this admission:  Microbiology results: 8/4 bld x2: MSSA in 2/2 8/4 urine: NG 8/5 MRSA PCR: neg 8/6 bld x2>ng  Thank you for allowing pharmacy to be a part of this patient's care.  Uvaldo Rising, BCPS  Clinical Pharmacist Pager [phone]  07/27/2017 12:48 PM

## 2017-07-27 NOTE — H&P (Signed)
Physical Medicine and Rehabilitation Admission H&P    Chief Complaint  Patient presents with  . Shortness of Breath  . Leg Swelling  : HPI: [name] is a 61 y.o. 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 Per chart review patient lives with spouse independent prior to admission and still driving. One level home one-step entry. Presented 07/10/2017 with increasing bilateral lower extremity edema and increasing shortness of breath. Patient recently seen in urgent care 2 weeks ago for right groin pain he did receive Flexeril was given a prescription for Bactrim which she did not take. Chest x-ray showed right pleural effusion and right lung base atelectasis versus infiltrate. Unilateral films of the pelvis showed no fracture. CT abdomen and pelvis showed cirrhosis moderate ascites. Echocardiogram with ejection fraction of 22-97% systolic function severely reduced. Blood cultures MSSA placed on Intravenous cefazolin 6 weeks until 08/21/2017 then convert to chronic suppressive oral cephalexin therapy. TEE completed showing no evidence of endocarditis and no plans to extract ICD device. Renal service follow-up for severe CKD stage IV initially requiring CRRT and latest creatinine 3.45 and presently on dialysis. Patient remains on chronic Coumadin therapy. Therapy evaluations completed an ongoing patient limited by fatigue. Request made for physical medicine rehabilitation consult.Patient was admitted for a comprehensive rehabilitation program  Review of Systems  Constitutional: Negative for fever.  HENT: Negative for hearing loss.   Eyes: Negative for blurred vision and double  vision.  Respiratory: Positive for shortness of breath.   Cardiovascular: Positive for palpitations and leg swelling. Negative for chest pain.  Gastrointestinal: Positive for constipation. Negative for nausea.       GERD  Genitourinary: Negative for hematuria.  Musculoskeletal: Positive for joint pain and myalgias.  Skin: Negative for rash.  Neurological: Positive for dizziness and weakness. Negative for seizures.  All other systems reviewed and are negative.  Past Medical History:  Diagnosis Date  . Adenocarcinoma of rectum ([place])    a. 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 AICD (automatic cardioverter/defibrillator) present 2002   BI V ICD  . Anemia   . CAD (coronary artery disease)    a. BMS to LAD 2001 at [facility] b. PTCA/atherectomy ramus and BMS to LAD 2009  . CHF (congestive heart failure) ([place])   . Cholelithiasis 06/2015  . Chronic kidney disease, stage IV (severe) ([place])   . Chronic systolic heart failure ([place])   . Cirrhosis ([place])   . Colostomy in place [facility])   . Dizziness    a. chronic. Admission for this 07/18/2014  . DM type 2, uncontrolled, with renal complications ([place])   . Dysrhythmia   . Essential hypertension, benign   . GERD (gastroesophageal reflux disease)   . HCAP (healthcare-associated pneumonia) 07/21/2015  . Hematuria   . History of blood transfusion    "I've had 2 units so far this year" (09/27/2015)  . HLD (hyperlipidemia)   . Ischemic cardiomyopathy    EF 18% by nuclear study 2016, multiple myocardial infarctions in past    . Myocardial infarction ([place]) 2001  . Obesity   . Orthostatic hypotension   . OSA on CPAP   . Paroxysmal atrial fibrillation ([facility])    a. on amiodarone, digoxin and Eliquis  . PONV (postoperative nausea and vomiting)   .  Prostate cancer ([place])    a. s/p seed implants with chemo and radiation  . SAH (subarachnoid hemorrhage) ([place])    post-traumatic (fall) [facility] 12/2014  . TIA (transient ischemic attack)    Past Surgical History:  Procedure Laterality  Date  . Abdominal and Perineal Resection of Rectum with Total Mesorectal Excision  10/04/2007  . AV FISTULA PLACEMENT Right 09/16/2015   Procedure: ARTERIOVENOUS (AV) FISTULA CREATION - BRACHIOCEPHALIC;  Surgeon: Elam Dutch, MD;  Location: [facility];  Service: Vascular;  Laterality: Right;  . BI-VENTRICULAR IMPLANTABLE CARDIOVERTER DEFIBRILLATOR  (CRT-D)  2009  . CARDIAC CATHETERIZATION  08/2001; 2009   ; [name] 07/10/2013  . CARDIAC DEFIBRILLATOR PLACEMENT  2002  . CARDIAC DEFIBRILLATOR PLACEMENT  2009   Upgraded to a BiV ICD  . COLONOSCOPY  09/14/2011   Dr. Gala Romney: via colostomy, Single pedunculated benign inflammatory polyp. Due for surveillance Oct 2015  . COLONOSCOPY N/A 07/02/2014   Procedure: COLONOSCOPY;  Surgeon: Daneil Dolin, MD;  [facility];  Service: Endoscopy;  Laterality: N/A;  7:30 / COLONOSCOPY THRU COLOSTOMY  . COLONOSCOPY N/A 12/11/2014   Dr. Gala Romney via colostomy. Normal. Repeat in 2021.   Marland Kitchen COLONOSCOPY N/A 08/24/2015   Dr. Havery Moros: diminutive polyp not removed, stoma site of bleeding  . COLOSTOMY  2008  . CORONARY ANGIOPLASTY WITH STENT PLACEMENT  2001; ~ 2006   "1 + 1"   . ELECTROPHYSIOLOGIC STUDY N/A 08/28/2015   Procedure: AV Node Ablation;  Surgeon: Will Meredith Leeds, MD;  Location: [facility] CV LAB;  Service: Cardiovascular;  Laterality: N/A;  . EP IMPLANTABLE DEVICE N/A 04/10/2015   Procedure: Ppm/Biv Ppm Generator Changeout;  Surgeon: Evans Lance, MD;  Location: [place] INVASIVE CV LAB CUPID;  Service: Cardiovascular;  Laterality: N/A;  . ERCP N/A 06/11/2016   Procedure: ENDOSCOPIC RETROGRADE CHOLANGIOPANCREATOGRAPHY (ERCP);  Surgeon: Teena Irani, MD;  Location: [facility] ENDOSCOPY;  Service: Endoscopy;  Laterality: N/A;  . ERCP N/A 01/26/2017   Procedure: ENDOSCOPIC RETROGRADE CHOLANGIOPANCREATOGRAPHY (ERCP);  Surgeon: Teena Irani, MD;  Location: [facility] ENDOSCOPY;  Service: Endoscopy;  Laterality: N/A;  . ERCP  06/2016   Dr. Amedeo Plenty: duodenal fistula, dilated bile duct  but no obvious stones, s/p sphincterotomy and stent placement  . ERCP  01/2017   Dr. Amedeo Plenty: CBD stones, s/p sphincterotomy, balloon extraction, stent removal  . ESOPHAGOGASTRODUODENOSCOPY N/A 07/02/2014   Procedure: ESOPHAGOGASTRODUODENOSCOPY (EGD);  Surgeon: Daneil Dolin, MD;  [facility];  Service: Endoscopy;  Laterality: N/A;  7:30  . ESOPHAGOGASTRODUODENOSCOPY N/A 12/11/2014   [id number] EGD  . ESOPHAGOGASTRODUODENOSCOPY (EGD) WITH PROPOFOL N/A 04/18/2017   Procedure: ESOPHAGOGASTRODUODENOSCOPY (EGD) WITH PROPOFOL;  Surgeon: Daneil Dolin, MD;  [facility];  Service: Endoscopy;  Laterality: N/A;  2:30pm  . GASTROINTESTINAL STENT REMOVAL N/A 01/26/2017   Procedure: GASTROINTESTINAL STENT REMOVAL;  Surgeon: Teena Irani, MD;  Location: [facility];  Service: Endoscopy;  Laterality: N/A;  . GIVENS CAPSULE STUDY N/A 07/23/2015   Dr. Michail Sermon: small non-bleeding AVM, mild gastritis  . LEFT HEART CATHETERIZATION WITH CORONARY ANGIOGRAM N/A 07/13/2013   Procedure: LEFT HEART CATHETERIZATION WITH CORONARY ANGIOGRAM;  Surgeon: Lorretta Harp, MD;  Location: [facility] CATH LAB;  Service: Cardiovascular;  Laterality: N/A;  . Venia Minks DILATION N/A 07/02/2014   Procedure: Venia Minks DILATION;  Surgeon: Daneil Dolin, MD;  [facility];  Service: Endoscopy;  Laterality: N/A;  7:30  . MALONEY DILATION N/A 04/18/2017   Procedure: Venia Minks DILATION;  Surgeon: Daneil Dolin, MD;  [facility];  Service: Endoscopy;  Laterality: N/A;  . PORTACATH PLACEMENT  06/2007   "removed ~ 1 yr later"  . RIGHT HEART CATHETERIZATION N/A 02/24/2015   Procedure: RIGHT HEART CATH;  Surgeon: Jolaine Artist, MD;  Location: [facility] CATH LAB;  Service: Cardiovascular;  Laterality: N/A;  . SAVORY DILATION N/A 07/02/2014   Procedure: SAVORY DILATION;  Surgeon: Daneil Dolin, MD;  [facility];  Service: Endoscopy;  Laterality: N/A;  7:30   Family History  Problem Relation Age of Onset    . Colon cancer Mother 87  . Coronary artery disease Father   . Colon cancer Sister 36  . Diabetes Sister   . Colon cancer Other        2 cousins, succumbed to illness   Social History:  reports that he has never smoked. He has never used smokeless tobacco. He reports that he does not drink alcohol or use drugs. Allergies: No Known Allergies Medications Prior to Admission  Medication Sig Dispense Refill  . atorvastatin (LIPITOR) 40 MG tablet Take 1 tablet (40 mg total) by mouth daily. (Patient taking differently: Take 40 mg by mouth at bedtime. ) 30 tablet 6  . Coenzyme Q10 (COQ10) 50 MG CAPS Take 1 capsule by mouth daily.    . collagenase (SANTYL) ointment Apply topically daily. (Patient taking differently: Apply topically every other day. At night) 15 g 0  . ferrous sulfate 325 (65 FE) MG tablet Take 1 tablet (325 mg total) by mouth 3 (three) times daily with meals. 90 tablet 0  . glucose blood test strip Use to test blood sugar 3 times daily 100 each 4  . insulin aspart (NOVOLOG FLEXPEN) 100 UNIT/ML FlexPen 5 units before each meal (Patient taking differently: Inject 6 Units into the skin 3 (three) times daily with meals. 5 units before each meal) 15 mL 1  . metolazone (ZAROXOLYN) 2.5 MG tablet take 1 tablet by mouth weekly if needed for SWELLING 12 tablet 3  . metoprolol succinate (TOPROL-XL) 25 MG 24 hr tablet Take 0.5 tablets (12.5 mg total) by mouth 2 (two) times daily. 45 tablet 3  . nitroGLYCERIN (NITROSTAT) 0.4 MG SL tablet Place 1 tablet (0.4 mg total) under the tongue every 5 (five) minutes as needed for chest pain. 25 tablet 3  . pantoprazole (PROTONIX) 40 MG tablet Take 1 tablet (40 mg total) by mouth daily. 30 tablet 0  . polyethylene glycol (MIRALAX / GLYCOLAX) packet Take 17 g by mouth daily as needed for mild constipation. 14 each 0  . potassium chloride SA (K-DUR,KLOR-CON) 20 MEQ tablet Take 2 tablets (40 mEq total) by mouth daily. (Patient taking differently: Take 20-40 mEq  by mouth daily. Only takes 2 tablets when taking metolazone) 60 tablet 6  . PROAIR RESPICLICK 025 (90 BASE) MCG/ACT AEPB Inhale 1 puff into the lungs every 6 (six) hours as needed (for breathing).   0  . torsemide (DEMADEX) 20 MG tablet TAKE 4 TABLETS IN THE MORNING AND 2 TABLET IN THE EVENING 180 tablet 6  . warfarin (COUMADIN) 2 MG tablet Take 1 tablet daily except 1/2 tablet on Tuesdays and Fridays (Patient taking differently: Take 1 tablet daily except 1/2 tablet on Tuesdays and Thursdays) 30 tablet 3  . Insulin Glargine (LANTUS SOLOSTAR) 100 UNIT/ML Solostar Pen Inject 15 Units into the skin daily at 10 pm. (Patient not taking: Reported on 06/13/2017) 5 pen 3    Home: Home Living Family/patient expects to be discharged to::  Private residence Living Arrangements: Spouse/significant other Available Help at Discharge: Family, Available PRN/intermittently Type of Home: House Home Access: Stairs to enter Secretary/administrator of Steps: 1 Home Layout: One level Bathroom Shower/Tub: Tub/shower unit, Health visitor: Standard Home Equipment: Environmental consultant - 4 wheels, Walker - standard, Tub bench (may be able to find more of "Mom's" equipment)   Functional History: Prior Function Level of Independence: Independent  Functional Status:  Mobility: Bed Mobility Overal bed mobility: Needs Assistance Bed Mobility: Sit to Supine, Supine to Sit Rolling: Min assist Sidelying to sit: Mod assist Supine to sit: Mod assist Sit to supine: +2 for physical assistance, Mod assist General bed mobility comments: Initially assist to come to EOB needing assist to elevate trunk and for LE assist.  Also assist to lower trunk and to lift LEs onto bed  Transfers Overall transfer level: Needs assistance Equipment used: 2 person hand held assist, Ambulation equipment used Transfer via Lift Equipment: Stedy Transfers: Sit to/from Stand Sit to Stand: Mod assist, Min assist, +2 safety/equipment, From  elevated surface Stand pivot transfers: Min assist, +2 safety/equipment  Lateral/Scoot Transfers: Max assist, +2 physical assistance General transfer comment: Pt reports signficant Rt hip pain but willing to try to stand to [facility].  Pt stood to [facility] 6 times - 1 min, 1 1/2 min, 30 seconds, 20 seconds, 1 min 20 seconds and 30 seconds.  MD came in when pt standing and MD was made aware of pts right hip pain liimiting his mobility recently and MD to make ORtho consult.  Pt did tolerate standing well with STedy.  Did place a pillow at knees for comfort.   Ambulation/Gait Ambulation/Gait assistance: Mod assist, +2 safety/equipment Ambulation Distance (Feet):  (12ft total with seated rest break) Assistive device:  (EVA walker) Gait Pattern/deviations: Step-through pattern, Decreased step length - right, Decreased step length - left, Drifts right/left, Trunk flexed, Narrow base of support General Gait Details: cues for posture; pt with increased trunk flexion with fatigue; assist for balance and management of AD; VSS    ADL: ADL Overall ADL's : Needs assistance/impaired Eating/Feeding: Bed level, Set up Grooming: Wash/dry hands, Wash/dry face, Minimal assistance, Brushing hair, Sitting Upper Body Bathing: Maximal assistance, Bed level, Sitting Lower Body Bathing: Total assistance, Sit to/from stand Upper Body Dressing : Sitting, Total assistance Lower Body Dressing: Total assistance, Sit to/from stand Toilet Transfer: Moderate assistance, +2 for physical assistance, BSC Toileting- Clothing Manipulation and Hygiene: Total assistance, Sit to/from stand Functional mobility during ADLs: Moderate assistance, +2 for physical assistance, +2 for safety/equipment  Cognition: Cognition Overall Cognitive Status: Within Functional Limits for tasks assessed Orientation Level: Oriented X4 Cognition Arousal/Alertness: Awake/alert Behavior During Therapy: WFL for tasks assessed/performed Overall Cognitive  Status: Within Functional Limits for tasks assessed Area of Impairment: Following commands, Problem solving, Awareness Following Commands: Follows one step commands consistently Awareness: Emergent Problem Solving: Slow processing General Comments: WFL for basic info   Physical Exam: Blood pressure (!) 89/54, pulse 77, temperature 97.9 F (36.6 C), temperature source Oral, resp. rate 12, height 5\' 10"  (1.778 m), weight 82.1 kg (181 lb), SpO2 95 %. Physical Exam  Vitals reviewed. Constitutional: No distress.  HENT:  Head: Normocephalic and atraumatic.  Eyes: EOM are normal.  Neck: Normal range of motion. Neck supple. No tracheal deviation present. No thyromegaly present.  Cardiovascular: Exam reveals no gallop and no friction rub.   No murmur heard. Cardiac rate controlled  Respiratory: Effort normal and breath sounds normal. No respiratory distress. He has  no wheezes.  GI: Soft. Bowel sounds are normal. He exhibits no distension.  Colostomy in place  Skin:  Bilateral lower extremity wounds with ischemic changes and dry dressing in place. No odor.  Neurological:  Mood is flat but appropriate. Difficult to arouse.  Oriented to person place and time. UE, bilaterally 3/5 deltoid, 3+ biceps, triceps, 4 wrist ext, HI. LLE: 3-/5 HF and KE and 4/5 ADF/PF. [name]: 2/5 HF, KE and 2-3/5 ADF/PF. Decreased LT inconsistent along right thigh.  Psych: flat, cooperative  Results for orders placed or performed during the hospital encounter of 07/09/17 (from the past 48 hour(s))  Glucose, capillary     Status: None   Collection Time: 07/25/17  4:21 PM  Result Value Ref Range   Glucose-Capillary 81 65 - 99 mg/dL  Glucose, capillary     Status: Abnormal   Collection Time: 07/25/17  9:18 PM  Result Value Ref Range   Glucose-Capillary 107 (H) 65 - 99 mg/dL   Comment 1 Notify RN   Magnesium     Status: None   Collection Time: 07/26/17  4:09 AM  Result Value Ref Range   Magnesium 1.9 1.7 - 2.4 mg/dL    Renal function panel (daily at 0500)     Status: Abnormal   Collection Time: 07/26/17  4:09 AM  Result Value Ref Range   Sodium 128 (L) 135 - 145 mmol/L   Potassium 3.5 3.5 - 5.1 mmol/L   Chloride 97 (L) 101 - 111 mmol/L   CO2 21 (L) 22 - 32 mmol/L   Glucose, Bld 92 65 - 99 mg/dL   BUN 53 (H) 6 - 20 mg/dL   Creatinine, Ser 3.21 (H) 0.61 - 1.24 mg/dL   Calcium 8.1 (L) 8.9 - 10.3 mg/dL   Phosphorus 5.2 (H) 2.5 - 4.6 mg/dL   Albumin 1.6 (L) 3.5 - 5.0 g/dL   GFR calc non Af Amer 19 (L) >60 mL/min   GFR calc Af Amer 22 (L) >60 mL/min    Comment: (NOTE) The eGFR has been calculated using the CKD EPI equation. This calculation has not been validated in all clinical situations. eGFR's persistently <60 mL/min signify possible Chronic Kidney Disease.    Anion gap 10 5 - 15  Heparin level (unfractionated)     Status: None   Collection Time: 07/26/17  4:09 AM  Result Value Ref Range   Heparin Unfractionated 0.52 0.30 - 0.70 IU/mL    Comment:        IF HEPARIN RESULTS ARE BELOW EXPECTED VALUES, AND PATIENT DOSAGE HAS BEEN CONFIRMED, SUGGEST FOLLOW UP TESTING OF ANTITHROMBIN III LEVELS.   CBC     Status: Abnormal   Collection Time: 07/26/17  4:09 AM  Result Value Ref Range   WBC 7.1 4.0 - 10.5 K/uL   RBC 3.00 (L) 4.22 - 5.81 MIL/uL   Hemoglobin 8.6 (L) 13.0 - 17.0 g/dL   HCT 26.9 (L) 39.0 - 52.0 %   MCV 89.7 78.0 - 100.0 fL   MCH 28.7 26.0 - 34.0 pg   MCHC 32.0 30.0 - 36.0 g/dL   RDW 17.1 (H) 11.5 - 15.5 %   Platelets 105 (L) 150 - 400 K/uL    Comment: CONSISTENT WITH PREVIOUS RESULT  Protime-INR     Status: Abnormal   Collection Time: 07/26/17  4:09 AM  Result Value Ref Range   Prothrombin Time 24.5 (H) 11.4 - 15.2 seconds   INR 2.17   .Cooxemetry Panel (carboxy, met, total hgb, O2  sat)     Status: Abnormal   Collection Time: 07/26/17  4:17 AM  Result Value Ref Range   Total hemoglobin 8.8 (L) 12.0 - 16.0 g/dL   O2 Saturation 71.2 %   Carboxyhemoglobin 2.4 (H) 0.5 - 1.5 %    Methemoglobin 1.0 0.0 - 1.5 %  Glucose, capillary     Status: None   Collection Time: 07/26/17  7:56 AM  Result Value Ref Range   Glucose-Capillary 82 65 - 99 mg/dL   Comment 1 Notify RN    Comment 2 Document in Chart   Hepatic function panel     Status: Abnormal   Collection Time: 07/26/17  9:30 AM  Result Value Ref Range   Total Protein 6.6 6.5 - 8.1 g/dL   Albumin 1.8 (L) 3.5 - 5.0 g/dL   AST 22 15 - 41 U/L   ALT <5 (L) 17 - 63 U/L    Comment: RESULT REPEATED AND VERIFIED   Alkaline Phosphatase 102 38 - 126 U/L   Total Bilirubin 1.2 0.3 - 1.2 mg/dL   Bilirubin, Direct 0.4 0.1 - 0.5 mg/dL   Indirect Bilirubin 0.8 0.3 - 0.9 mg/dL  Glucose, capillary     Status: None   Collection Time: 07/26/17 12:16 PM  Result Value Ref Range   Glucose-Capillary 86 65 - 99 mg/dL   Comment 1 Notify RN    Comment 2 Document in Chart   Glucose, capillary     Status: Abnormal   Collection Time: 07/26/17  3:57 PM  Result Value Ref Range   Glucose-Capillary 125 (H) 65 - 99 mg/dL   Comment 1 Notify RN    Comment 2 Document in Chart   Glucose, capillary     Status: Abnormal   Collection Time: 07/26/17  9:37 PM  Result Value Ref Range   Glucose-Capillary 123 (H) 65 - 99 mg/dL  Magnesium     Status: None   Collection Time: 07/27/17  3:56 AM  Result Value Ref Range   Magnesium 1.9 1.7 - 2.4 mg/dL  CBC     Status: Abnormal   Collection Time: 07/27/17  3:56 AM  Result Value Ref Range   WBC 6.7 4.0 - 10.5 K/uL   RBC 2.97 (L) 4.22 - 5.81 MIL/uL   Hemoglobin 8.7 (L) 13.0 - 17.0 g/dL   HCT 27.2 (L) 39.0 - 52.0 %   MCV 91.6 78.0 - 100.0 fL   MCH 29.3 26.0 - 34.0 pg   MCHC 32.0 30.0 - 36.0 g/dL   RDW 17.6 (H) 11.5 - 15.5 %   Platelets 105 (L) 150 - 400 K/uL    Comment: CONSISTENT WITH PREVIOUS RESULT  Protime-INR     Status: Abnormal   Collection Time: 07/27/17  3:56 AM  Result Value Ref Range   Prothrombin Time 29.7 (H) 11.4 - 15.2 seconds   INR 2.75   Renal function panel     Status:  Abnormal   Collection Time: 07/27/17  3:56 AM  Result Value Ref Range   Sodium 131 (L) 135 - 145 mmol/L   Potassium 3.4 (L) 3.5 - 5.1 mmol/L   Chloride 98 (L) 101 - 111 mmol/L   CO2 23 22 - 32 mmol/L   Glucose, Bld 104 (H) 65 - 99 mg/dL   BUN 55 (H) 6 - 20 mg/dL   Creatinine, Ser 3.44 (H) 0.61 - 1.24 mg/dL   Calcium 8.4 (L) 8.9 - 10.3 mg/dL   Phosphorus 6.0 (H) 2.5 - 4.6 mg/dL  Albumin 1.7 (L) 3.5 - 5.0 g/dL   GFR calc non Af Amer 18 (L) >60 mL/min   GFR calc Af Amer 20 (L) >60 mL/min    Comment: (NOTE) The eGFR has been calculated using the CKD EPI equation. This calculation has not been validated in all clinical situations. eGFR's persistently <60 mL/min signify possible Chronic Kidney Disease.    Anion gap 10 5 - 15  .Cooxemetry Panel (carboxy, met, total hgb, O2 sat)     Status: Abnormal   Collection Time: 07/27/17  4:21 AM  Result Value Ref Range   Total hemoglobin 8.7 (L) 12.0 - 16.0 g/dL   O2 Saturation 98.4 %   Carboxyhemoglobin 2.8 (H) 0.5 - 1.5 %   Methemoglobin 2.6 (H) 0.0 - 1.5 %  Glucose, capillary     Status: None   Collection Time: 07/27/17  8:04 AM  Result Value Ref Range   Glucose-Capillary 92 65 - 99 mg/dL  Glucose, capillary     Status: Abnormal   Collection Time: 07/27/17 11:22 AM  Result Value Ref Range   Glucose-Capillary 103 (H) 65 - 99 mg/dL   No results found.     Medical Problem List and Plan: 1.  Severe debilitation/encephalopathy secondary to respiratory failure, CHF, MSSA sepsis  -admit to inpatient rehab 2.  DVT Prophylaxis/Anticoagulation: Chronic Coumadin therapy and monitor for rebleeding episodes 3. Pain Management: Hydrocodone as needed 4. Mood: Provide emotional support 5. Neuropsych: This patient is capable of making decisions on his own behalf. 6. Skin/Wound Care/lower extremity leg wound: Routine skin checks with skin care as directed  -ostomy currently sealed/functioning 7. Fluids/Electrolytes/Nutrition: Routine I&O with  follow-up chemistries on admit 8. CKD stage IV. Followed closely by renal services. 9. MSSA bacteremia. Plan intravenous cefazolin through 08/21/2017 then chronic Keflex and follow-up per infectious disease 10. 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 Routine colostomy care 11. CAD with implantable defibrillator. Follow-up cardiology services. No plan to extract device 12. Chronic systolic congestive heart failure/ischemic cardiomyopathy. Monitor for any signs of fluid overload.   -daily weights and I's and O's 13. Diabetes mellitus and peripheral neuropathy. Lantus insulin 10 units daily at bedtime. 14. Orthostasis. Continue ProAmatine 10 mg 3 times a day. Monitor orthostatic vs with activity.  15. Acute on chronic anemia. Continue Aranesp. 16. History of gout. Colchicine 0.3 mg daily. Monitor for any gout flareups  Post Admission Physician Evaluation: 1. Functional deficits secondary  to debility/encephalopathy. 2. Patient is admitted to receive collaborative, interdisciplinary care between the physiatrist, rehab nursing staff, and therapy team. 3. Patient's level of medical complexity and substantial therapy needs in context of that medical necessity cannot be provided at a lesser intensity of care such as a SNF. 4. Patient has experienced substantial functional loss from his/her baseline which was documented above under the "Functional History" and "Functional Status" headings.  Judging by the patient's diagnosis, physical exam, and functional history, the patient has potential for functional progress which will result in measurable gains while on inpatient rehab.  These gains will be of substantial and practical use upon discharge  in facilitating mobility and self-care at the household level. 5. Physiatrist will provide 24 hour management of medical needs as well as oversight of the therapy plan/treatment and provide guidance as appropriate regarding the interaction of the  two. 6. The Preadmission Screening has been reviewed and patient status is unchanged unless otherwise stated above. 7. 24 hour rehab nursing will assist with bladder management, bowel management, safety, skin/wound  care, disease management, medication administration, pain management and patient education  and help integrate therapy concepts, techniques,education, etc. 8. PT will assess and treat for/with: Lower extremity strength, range of motion, stamina, balance, functional mobility, safety, adaptive techniques and equipment, NMR, activity tolerance, family education.   Goals are: supervision to min assist. 9. OT will assess and treat for/with: ADL's, functional mobility, safety, upper extremity strength, adaptive techniques and equipment, NMR, wound mgt, family ed, ego support.   Goals are: supervision to min assist. Therapy may proceed with showering this patient. 10. SLP: consider assessment/screen based on clinical appearance on rehab.  11. Case Management and Social Worker will assess and treat for psychological issues and discharge planning. 12. Team conference will be held weekly to assess progress toward goals and to determine barriers to discharge. 13. Patient will receive at least 3 hours of therapy per day at least 5 days per week. 14. ELOS: 18-25       15. Prognosis:  excellent     Meredith Staggers, MD, [facility] 08/02/2017  Cathlyn Parsons., PA-C 07/27/2017

## 2017-07-28 ENCOUNTER — Encounter (HOSPITAL_COMMUNITY): Payer: Self-pay | Admitting: Interventional Radiology

## 2017-07-28 ENCOUNTER — Ambulatory Visit: Payer: Medicare Other | Admitting: Cardiology

## 2017-07-28 DIAGNOSIS — R7881 Bacteremia: Secondary | ICD-10-CM

## 2017-07-28 LAB — CBC
HEMATOCRIT: 27.6 % — AB (ref 39.0–52.0)
HEMOGLOBIN: 8.7 g/dL — AB (ref 13.0–17.0)
MCH: 28.5 pg (ref 26.0–34.0)
MCHC: 31.5 g/dL (ref 30.0–36.0)
MCV: 90.5 fL (ref 78.0–100.0)
Platelets: 113 10*3/uL — ABNORMAL LOW (ref 150–400)
RBC: 3.05 MIL/uL — AB (ref 4.22–5.81)
RDW: 17.5 % — AB (ref 11.5–15.5)
WBC: 7.2 10*3/uL (ref 4.0–10.5)

## 2017-07-28 LAB — PROTIME-INR
INR: 3.35
Prothrombin Time: 34.7 seconds — ABNORMAL HIGH (ref 11.4–15.2)

## 2017-07-28 LAB — GLUCOSE, CAPILLARY
GLUCOSE-CAPILLARY: 132 mg/dL — AB (ref 65–99)
GLUCOSE-CAPILLARY: 93 mg/dL (ref 65–99)
Glucose-Capillary: 107 mg/dL — ABNORMAL HIGH (ref 65–99)
Glucose-Capillary: 105 mg/dL — ABNORMAL HIGH (ref 65–99)

## 2017-07-28 LAB — RENAL FUNCTION PANEL
ALBUMIN: 1.8 g/dL — AB (ref 3.5–5.0)
ANION GAP: 11 (ref 5–15)
BUN: 61 mg/dL — ABNORMAL HIGH (ref 6–20)
CALCIUM: 8.6 mg/dL — AB (ref 8.9–10.3)
CO2: 22 mmol/L (ref 22–32)
CREATININE: 3.83 mg/dL — AB (ref 0.61–1.24)
Chloride: 96 mmol/L — ABNORMAL LOW (ref 101–111)
GFR, EST AFRICAN AMERICAN: 18 mL/min — AB (ref >60)
GFR, EST NON AFRICAN AMERICAN: 16 mL/min — AB (ref >60)
Glucose, Bld: 137 mg/dL — ABNORMAL HIGH (ref 65–99)
PHOSPHORUS: 6.3 mg/dL — AB (ref 2.5–4.6)
Potassium: 4.3 mmol/L (ref 3.5–5.1)
SODIUM: 129 mmol/L — AB (ref 135–145)

## 2017-07-28 LAB — COOXEMETRY PANEL
Carboxyhemoglobin: 1.9 % — ABNORMAL HIGH (ref 0.5–1.5)
Methemoglobin: 1.3 % (ref 0.0–1.5)
O2 Saturation: 59.1 %
Total hemoglobin: 9.5 g/dL — ABNORMAL LOW (ref 12.0–16.0)

## 2017-07-28 LAB — MAGNESIUM: Magnesium: 1.9 mg/dL (ref 1.7–2.4)

## 2017-07-28 LAB — HEMOGLOBIN A1C
HEMOGLOBIN A1C: 6.6 % — AB (ref 4.8–5.6)
Mean Plasma Glucose: 142.72 mg/dL

## 2017-07-28 MED ORDER — MAGNESIUM SULFATE 2 GM/50ML IV SOLN
2.0000 g | Freq: Once | INTRAVENOUS | Status: AC
  Administered 2017-07-28: 2 g via INTRAVENOUS
  Filled 2017-07-28: qty 50

## 2017-07-28 NOTE — Progress Notes (Signed)
PROGRESS NOTE    [name]  HWE:[id number] DOB: 1956-11-02 DOA: 07/09/2017 PCP: Redmond School, MD    Brief Narrative:  61 y.o.male with history of ischemic cardiomyopathy status post ICD placement. Also history of chronic renal failure. Admitted on 8/4 with acute respiratory failure secondary to pulmonary edema. Subsequently found to have a MSSA bacteremia. Patient has had persistent shock requiring vasopressor support and is on renal replacement therapy. He was initially admitted to [facility] and transferred to [facility] service on 8/20.  8/21 no new issues, worked with PT, requested CIR consult.    Assessment & Plan:   Principal Problem:   Staphylococcus aureus bacteremia with sepsis [facility]) Active Problems:   Ischemic cardiomyopathy-EF 35% 04/24/15 echo   Long term current use of anticoagulant therapy   CAD with LCX disease and stents to LAD   Chronic a-fib (HCC)   Chronic kidney disease, stage IV (severe) (HCC)   OSA on CPAP   Hyperlipidemia   BiV ICD (St Jude) gen change 04/10/15   Chronic atrial fibrillation (HCC)   Right heart failure (HCC)   Septic shock (HCC)   Cardiorenal syndrome with renal failure   Infected defibrillator ([place])   Septic hip ([place])   Severe sepsis with septic shock ([place])  #1 MSSA bacteremia/sepsis Patient with some clinical improvement. Afebrile. Patient with a normal white count. Repeat blood cultures from 07/11/2017 negative to date.TEE 07/19/2017 negative for endocarditis or vegetation on valves/ICD. ID placed an order for double-lumen central line per Interventional radiology for 6 weeks of IV antibiotics and then converted to chronic suppressive oral cephalexin therapy per ID recommendations. Outpatient follow-up with ID. ID following and appreciate input and recommendations.  #2 acute on chronic systolic heart failure/cardiogenic shock 2-D echo from August 2018 with a EF of 15-20%.atient with low blood pressure with systolics in the 89F to 81O Decreased  blood pressure felt likely to be secondary to sepsis and cardiogenic shock. Patient clinically volume overloaded. Patient unable to place on a beta blocker secondary to acute decompensation. Patient with worsening renal function and cannot be placed on a ACE inhibitor or ARB. Patient currentlyon Lasix 160 mg orally3 times daily and Zaroxolyn 10 mg daily. Midrin dose hs been increased to 10mg  TID.Per cardiology/heart failure team.  #3 acute on chronic kidney disease stage IV Patient did receive CVVHD during this hospitalization which has subsequently been discontinued. Renal function trending back up. Patient with a urine put of 1.8 L over the past 24 hours. Patient with low blood pressure. Midrin dose has been ncreased due to low blood pressure. Per nephrology no need for intervention at this time however may probably try IHD in 4-5 days. Nephrology following.  #4 cirrhosis Per CT abdomen and pelvis.Patient currently on diuretics. Outpatient follow-up.  #5 DM Check a hemoglobin A1c. CBGs ranging from 132-144. Continue Lantus and sliding scale insulin.  #6 history of gout Stable. Continue colchicine.  #7 anemia Likely secondary to chronic kidney disease. S/p RBC transfusion x 2 on 8/17. H&H stable Patient receiving Aranesp.  #8 chronic atrial fibrillation Currently rate controlled. Coumadin for anticoagulation. Cardiology following.  #9 H/O rectal CA with diverting colostomy Outpatient follow-up.  DVT prophylaxis: coumadin Code Status: full Family Communication: updated patient. No family present. Disposition Plan: CIR versus skilled nursing facility.   Consultants:   CIR: Dr. Tessa Lerner 07/26/2017   electrophysiology: Dr. Rayann Heman 07/14/2017  Cardiology/heart failure Dr.Qureshi 07/10/2017  Nephrology: Dr. Lorrene Reid 07/11/2017  Infectious disease: Dr. Victorhugo Medal 07/11/2017  Procedures: TEE 8/14: EF  20% with diffuse hypokinesis. Moderate hypokinesis of RV. ICD/pacing wires noted without  vegetation. Left atrium dilated without thrombus. Right atrium dilated and pacing wires without vegetation. Mild aortic insufficiency without vegetation. Mild mitral regurgitation without vegetation. Moderate to severe tricuspid regurgitation without vegetation. Pulmonic valve without vegetation. No PFO or ASD. No pericardial effusion. CT right hip 07/11/2017 CT abdomen and pelvis 07/10/2017 2-D echo 07/10/2017 Transfused 2 units packed red blood cells 07/22/2017 Right IJV tunneled PICC per Dr. Barbie Banner 07/27/2017   SIGNIFICANT EVENTS: 08/04 - Admit 08/05 - Started on CVVHD 08/15 - CVVHD stopped  8/20 - transfer to [facility] service.    Antimicrobials:   IV Ancef 07/10/2017   IV vancomycin 07/10/2017   Subjective: Patient sitting up in chair. Denies shortness of breath. No chest pain.   Objective: Vitals:   07/28/17 0400 07/28/17 0447 07/28/17 0732 07/28/17 0812  BP: (!) 97/58   (!) 103/57  Pulse: 73  73 73  Resp: 13  19 13   Temp:   97.9 F (36.6 C) 98 F (36.7 C)  TempSrc:    Oral  SpO2: 94%  99% 97%  Weight:  82.5 kg (181 lb 14.1 oz)    Height:        Intake/Output Summary (Last 24 hours) at 07/28/17 1025 Last data filed at 07/28/17 0732  Gross per 24 hour  Intake              350 ml  Output             1950 ml  Net            -1600 ml   Filed Weights   07/26/17 0343 07/27/17 0320 07/28/17 0447  Weight: 82.5 kg (181 lb 14.1 oz) 82.1 kg (181 lb) 82.5 kg (181 lb 14.1 oz)    Examination:  General exam: Appears calm and comfortable  Respiratory system: bibasilar crackles. Respiratory effort normal. Cardiovascular system: S1 & S2 heard, RRR. 3/6 SEM. Trace-1+ bilateral pedal edema. Gastrointestinal system: Abdomen is nondistended, soft and nontender. No organomegaly or masses felt. Normal bowel sounds heard. Central nervous system: Alert and oriented. No focal neurological deficits. Extremities: Symmetric 5 x 5 power. Skin: chronic venous stasis changes on lower  extremities. Bandages around bilateral shins. Psychiatry: Judgement and insight appear normal. Mood & affect appropriate.     Data Reviewed: I have personally reviewed following labs and imaging studies  CBC:  Recent Labs Lab 07/24/17 0336 07/25/17 0351 07/26/17 0409 07/27/17 0356 07/28/17 0430  WBC 10.2 8.6 7.1 6.7 7.2  HGB 8.9* 9.0* 8.6* 8.7* 8.7*  HCT 27.8* 27.9* 26.9* 27.2* 27.6*  MCV 90.3 91.2 89.7 91.6 90.5  PLT 115* 124* 105* 105* 810*   Basic Metabolic Panel:  Recent Labs Lab 07/24/17 0336 07/24/17 0500 07/25/17 0351 07/26/17 0409 07/27/17 0356 07/28/17 0430  NA  --  127* 128* 128* 131* 129*  K  --  4.0 3.7 3.5 3.4* 4.3  CL  --  95* 96* 97* 98* 96*  CO2  --  22 22 21* 23 22  GLUCOSE  --  104* 127* 92 104* 137*  BUN  --  42* 48* 53* 55* 61*  CREATININE  --  2.84* 3.13* 3.21* 3.44* 3.83*  CALCIUM  --  8.1* 7.9* 8.1* 8.4* 8.6*  MG 1.9  --  1.9 1.9 1.9 1.9  PHOS  --  5.1* 4.9* 5.2* 6.0* 6.3*   GFR: Estimated Creatinine Clearance: 20.6 mL/min (A) (by C-G formula based on SCr  of 3.83 mg/dL (H)). Liver Function Tests:  Recent Labs Lab 07/22/17 0803  07/25/17 0351 07/26/17 0409 07/26/17 0930 07/27/17 0356 07/28/17 0430  AST 25  --   --   --  22  --   --   ALT 5*  --   --   --  <5*  --   --   ALKPHOS 135*  --   --   --  102  --   --   BILITOT 1.3*  --   --   --  1.2  --   --   PROT 6.4*  --   --   --  6.6  --   --   ALBUMIN 1.9*  < > 1.7* 1.6* 1.8* 1.7* 1.8*  < > = values in this interval not displayed. No results for input(s): LIPASE, AMYLASE in the last 168 hours. No results for input(s): AMMONIA in the last 168 hours. Coagulation Profile:  Recent Labs Lab 07/24/17 0336 07/25/17 0351 07/26/17 0409 07/27/17 0356 07/28/17 0430  INR 1.45 1.55 2.17 2.75 3.35   Cardiac Enzymes: No results for input(s): CKTOTAL, CKMB, CKMBINDEX, TROPONINI in the last 168 hours. BNP (last 3 results) No results for input(s): PROBNP in the last 8760  hours. HbA1C: No results for input(s): HGBA1C in the last 72 hours. CBG:  Recent Labs Lab 07/27/17 0804 07/27/17 1122 07/27/17 1545 07/27/17 2104 07/28/17 0813  GLUCAP 92 103* 136* 144* 132*   Lipid Profile: No results for input(s): CHOL, HDL, LDLCALC, TRIG, CHOLHDL, LDLDIRECT in the last 72 hours. Thyroid Function Tests: No results for input(s): TSH, T4TOTAL, FREET4, T3FREE, THYROIDAB in the last 72 hours. Anemia Panel:  Recent Labs  07/25/17 1144  TIBC 225*  IRON 22*   Sepsis Labs: No results for input(s): PROCALCITON, LATICACIDVEN in the last 168 hours.  No results found for this or any previous visit (from the past 240 hour(s)).       Radiology Studies: No results found.      Scheduled Meds: . colchicine  0.3 mg Oral Daily  . darbepoetin (ARANESP) injection - NON-DIALYSIS  200 mcg Subcutaneous Q Wed-1800  . furosemide  160 mg Oral TID  . insulin aspart  0-5 Units Subcutaneous QHS  . insulin aspart  0-9 Units Subcutaneous TID WC  . insulin aspart  3 Units Subcutaneous TID WC  . insulin glargine  10 Units Subcutaneous QHS  . metolazone  10 mg Oral Daily  . midodrine  10 mg Oral TID WC  . sodium chloride flush  10-40 mL Intracatheter Q12H  . Warfarin - Pharmacist Dosing Inpatient   Does not apply q1800   Continuous Infusions: . sodium chloride 250 mL (07/24/17 1900)  .  ceFAZolin (ANCEF) IV Stopped (07/28/17 0543)     LOS: 18 days    Time spent: 21 minutes    Kwanza Cancelliere, MD [facility] Pager [phone]  If 7PM-7AM, please contact night-coverage [email or web address] Password [facility] 07/28/2017, 10:25 AM

## 2017-07-28 NOTE — Progress Notes (Signed)
ANTICOAGULATION CONSULT NOTE - Follow Up Consult  Pharmacy Consult for Warfarin Indication: atrial fibrillation  No Known Allergies  Patient Measurements: Height: 5\' 10"  (177.8 cm) Weight: 181 lb 14.1 oz (82.5 kg) IBW/kg (Calculated) : 73  Vital Signs: Temp: 98 F (36.7 C) (08/23 0812) Temp Source: Oral (08/23 0812) BP: 103/57 (08/23 0812) Pulse Rate: 73 (08/23 0812)  Labs:  Recent Labs  07/26/17 0409 07/27/17 0356 07/28/17 0430  HGB 8.6* 8.7* 8.7*  HCT 26.9* 27.2* 27.6*  PLT 105* 105* 113*  LABPROT 24.5* 29.7* 34.7*  INR 2.17 2.75 3.35  HEPARINUNFRC 0.52  --   --   CREATININE 3.21* 3.44* 3.83*    Estimated Creatinine Clearance: 20.6 mL/min (A) (by C-G formula based on SCr of 3.83 mg/dL (H)).   Assessment: 61yo male with Afib, Coumadin restarted 8/19.  No bleeding or complications noted.  CBC stable.  INR rising quickly today despite lower doses of Coumadin.  Goal of Therapy:  INR 2-3 Monitor platelets by anticoagulation protocol: Yes   Plan:  No Coumadin tonight. Daily INR.  Uvaldo Rising, BCPS  Clinical Pharmacist Pager [phone]  07/28/2017 10:32 AM

## 2017-07-28 NOTE — Progress Notes (Signed)
Patient had 14 beat run of V TACH. Patient is sitting up in chair asymptomatic.  Current vitals: Hr- 74 vpaced bp-95/59 Paged Dr Grandville Silos and heart failure team. Will continue to monitor.

## 2017-07-28 NOTE — Progress Notes (Signed)
Pt called and stated he wanted to get back in bed. Tech arrived to room and before putting Pt back to bed, tech asked Pt to get a bath before laying down. Pt stated "I just don't fell like it right now".

## 2017-07-28 NOTE — Progress Notes (Signed)
Subjective: Interval History: has no complaint , wants to get stronger.  Objective: Vital signs in last 24 hours: Temp:  [97.4 F (36.3 C)-97.9 F (36.6 C)] 97.6 F (36.4 C) (08/23 0340) Pulse Rate:  [73-77] 73 (08/23 0400) Resp:  [11-15] 13 (08/23 0400) BP: (89-97)/(54-58) 97/58 (08/23 0400) SpO2:  [94 %-98 %] 94 % (08/23 0400) Weight:  [82.5 kg (181 lb 14.1 oz)] 82.5 kg (181 lb 14.1 oz) (08/23 0447) Weight change: 0.4 kg (14.1 oz)  Intake/Output from previous day: 08/22 0701 - 08/23 0700 In: 590 [P.O.:480; I.V.:10; IV Piggyback:100] Out: 1800 [Urine:1800] Intake/Output this shift: No intake/output data recorded.  General appearance: alert, cooperative and no distress Neck: R IJ HD cath, L pacer, IV R IJ Resp: diminished breath sounds bilaterally and rales bibasilar Cardio: S1, S2 normal, systolic murmur: holosystolic 2/6, blowing at apex and paced GI: mod distension, pos bs, liver down 5 cm Extremities: edema 3-4+  Lab Results:  Recent Labs  07/27/17 0356 07/28/17 0430  WBC 6.7 7.2  HGB 8.7* 8.7*  HCT 27.2* 27.6*  PLT 105* 113*   BMET:  Recent Labs  07/27/17 0356 07/28/17 0430  NA 131* 129*  K 3.4* 4.3  CL 98* 96*  CO2 23 22  GLUCOSE 104* 137*  BUN 55* 61*  CREATININE 3.44* 3.83*  CALCIUM 8.4* 8.6*   No results for input(s): PTH in the last 72 hours. Iron Studies:  Recent Labs  07/25/17 1144  IRON 22*  TIBC 225*    Studies/Results: No results found.  I have reviewed the patient's current medications.  Assessment/Plan: 1 CKD 4/AKI diuresing on po Lasix/Metol.  Rate acceptable for bp.. Worsening Cr daily but no need for intervention . will probably try IHD in 4-5 D.   2 Anemia esa 3 HPTH 4 CM EF 15-20% 5 Cirrhosis 6 DM controlled 7 [place] ? CIR 8 Low bps on Mido P Lasix, metol, esa, Mido, rehab    LOS: 18 days   Sabrinna Yearwood L 07/28/2017,7:04 AM

## 2017-07-28 NOTE — Progress Notes (Signed)
Tech offered Pt a bath. Pt refused at this time. Tech will recheck later regarding bath.

## 2017-07-28 NOTE — Progress Notes (Signed)
Patient ID: [name], male   DOB: 07/27/1956, 61 y.o.   MRN: [id number]     Advanced Heart Failure Rounding Note  Primary Cardiologist: [name]  Subjective:    Admitted with staph bacteremia with mixed septic/cardiogenic shock.   TEE 07/19/17 with LVEF 20%. No evidence of endocarditis or vegetation on valves/ICD  CVVD stopped 07/20/17.   Continues to diurese with IV lasix + metolazone. Weight unchanged but with over a liter urine. Had PICC placed.   Denies SOB. Able to take a couple of steps.    Objective:   Weight Range: 181 lb 14.1 oz (82.5 kg) Body mass index is 26.1 kg/m.   Vital Signs:   Temp:  [97.4 F (36.3 C)-98 F (36.7 C)] 98 F (36.7 C) (08/23 0812) Pulse Rate:  [73-77] 73 (08/23 0812) Resp:  [11-19] 13 (08/23 0812) BP: (89-103)/(54-58) 103/57 (08/23 0812) SpO2:  [94 %-99 %] 97 % (08/23 0812) Weight:  [181 lb 14.1 oz (82.5 kg)] 181 lb 14.1 oz (82.5 kg) (08/23 0447) Last BM Date: 07/27/17  Weight change: Filed Weights   07/26/17 0343 07/27/17 0320 07/28/17 0447  Weight: 181 lb 14.1 oz (82.5 kg) 181 lb (82.1 kg) 181 lb 14.1 oz (82.5 kg)    Intake/Output:   Intake/Output Summary (Last 24 hours) at 07/28/17 0849 Last data filed at 07/28/17 0732  Gross per 24 hour  Intake              350 ml  Output             1950 ml  Net            -1600 ml      Physical Exam   General:  Appears chronically ill. No respiratory difficulty.  HEENT: normal Neck: supple. JVP to ear.  Carotids 2+ bilat; no bruits. No lymphadenopathy or thryomegaly appreciated. Cor: PMI nondisplaced. Regular rate & rhythm. No rubs, gallops or murmurs. Lungs: clear Abdomen: soft, nontender, nondistended. No hepatosplenomegaly. No bruits or masses. Good bowel sounds. Extremities: no cyanosis, clubbing, rash, R and LLE 3+  Edema RUE fistula Neuro: alert & orientedx3, cranial nerves grossly intact. moves all 4 extremities w/o difficulty. Affect pleasant   Telemetry   BiV pacing  70s underlying rhythm a fib.   Labs    CBC  Recent Labs  07/27/17 0356 07/28/17 0430  WBC 6.7 7.2  HGB 8.7* 8.7*  HCT 27.2* 27.6*  MCV 91.6 90.5  PLT 105* 765*   Basic Metabolic Panel  Recent Labs  07/27/17 0356 07/28/17 0430  NA 131* 129*  K 3.4* 4.3  CL 98* 96*  CO2 23 22  GLUCOSE 104* 137*  BUN 55* 61*  CREATININE 3.44* 3.83*  CALCIUM 8.4* 8.6*  MG 1.9 1.9  PHOS 6.0* 6.3*   Liver Function Tests  Recent Labs  07/26/17 0930 07/27/17 0356 07/28/17 0430  AST 22  --   --   ALT <5*  --   --   ALKPHOS 102  --   --   BILITOT 1.2  --   --   PROT 6.6  --   --   ALBUMIN 1.8* 1.7* 1.8*   No results for input(s): LIPASE, AMYLASE in the last 72 hours. Cardiac Enzymes No results for input(s): CKTOTAL, CKMB, CKMBINDEX, TROPONINI in the last 72 hours.  BNP: BNP (last 3 results)  Recent Labs  06/13/17 1205 07/09/17 2151  BNP 1,151.0* 1,553.9*    ProBNP (last 3 results) No results for input(s):  PROBNP in the last 8760 hours.   D-Dimer No results for input(s): DDIMER in the last 72 hours. Hemoglobin A1C No results for input(s): HGBA1C in the last 72 hours. Fasting Lipid Panel No results for input(s): CHOL, HDL, LDLCALC, TRIG, CHOLHDL, LDLDIRECT in the last 72 hours. Thyroid Function Tests No results for input(s): TSH, T4TOTAL, T3FREE, THYROIDAB in the last 72 hours.  Invalid input(s): FREET3  Other results:   Imaging   No results found.  Medications:     Scheduled Medications: . colchicine  0.3 mg Oral Daily  . darbepoetin (ARANESP) injection - NON-DIALYSIS  200 mcg Subcutaneous Q Wed-1800  . furosemide  160 mg Oral TID  . insulin aspart  0-5 Units Subcutaneous QHS  . insulin aspart  0-9 Units Subcutaneous TID WC  . insulin aspart  3 Units Subcutaneous TID WC  . insulin glargine  10 Units Subcutaneous QHS  . metolazone  10 mg Oral Daily  . midodrine  10 mg Oral TID WC  . sodium chloride flush  10-40 mL Intracatheter Q12H  . Warfarin -  Pharmacist Dosing Inpatient   Does not apply q1800    Infusions: . sodium chloride 250 mL (07/24/17 1900)  .  ceFAZolin (ANCEF) IV Stopped (07/28/17 0543)    PRN Medications: sodium chloride, acetaminophen, alum & mag hydroxide-simeth, fentaNYL (SUBLIMAZE) injection, HYDROcodone-acetaminophen, lidocaine (PF), magic mouthwash w/lidocaine, ondansetron (ZOFRAN) IV, sodium chloride flush    Patient Profile   [name] is a 61 year old with a history of DMII, [place] 2016, rectal cancer, CKD Stage IV, chronic A fib, S/P AVN ablation, PAF, CAD BMS LAD 2011 BMS LAD 2009, OSA, chronic systolic heart failure, St Jude BiV ICD admitted with AKI and shock with elevated lactate, suspect cardiogenic.   Assessment/Plan   1. Acute on chronic systolic CHF/cardiogenic shock: Last echo in 8/17 with EF 15-20%.  Ischemic cardiomyopathy, has St Jude BiV ICD (despite chronic afib appears to be effectively BiV pacing).  Had recent admission (discharged 06/21/17) requiring milrinone support and diuresis.  Admitted with soft BP (SBP 80s-90s) + elevated lactate.  Suspect mixed septic/cardiogenic shock with MSSA bacteremia.  - CVVHD stopped 07/20/17.  - Remains overloaded. Weight unchanged.   Continue high dose lasix + metolazone.  -  No beta blocker with decompensation, hypotension.  - No ARB with AKI.  - Nephrology doubts he will tolerate iHD. Marland Kitchen   2. AKI on CKD stage 4:  - Creatinine trending up.  No plan for HD today.  -Per nephrology will likely need iHD in 4-5 days.   -Continue midodrine.   3. Atrial fibrillation: Chronic - Continue coumadin. No plans to extract device.  - INR 3.35 discussed with HF pharm d.  4. CAD:  - No chest pain.   - Continue ASA. No statin with recent elevated LFT's    5. ID:  - Blood CX 2/2 MSSA. Antibiotics narrowed, now on Ancef. 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   - Not planning ICD extraction for now due to severe comorbidities. He is device dependent.  -  TEE 07/19/17 with no evidence of endocarditis.  -Per ID continue cefazolin until 9/15 the start renally dosed cephalexin.  PICC placed.    6. H/O Rectal CA with diverting colostomy   7. Type II diabetes:  - Continue sliding scale.   8. Liver failure:  LFTs elevated on admit . Check LFTs in am.   9. Anemia of chronic disease. - s/p RBCs on 8/17.  -  Todays hgb 8.7   10. Deconditioning-- PT/OT recommending CIR. CIR following.       I will follow up with CIR today.    Length of Stay: [place], NP  07/28/2017, 8:49 AM  Advanced Heart Failure Team Pager [phone] (M-F; 7a - 4p)  Please contact [facility] for night-coverage after hours (4p -7a ) and weekends on [email or web address]  Patient seen and examined with Darrick Grinder, NP. We discussed all aspects of the encounter. I agree with the assessment and plan as stated above.   Volume status stable on high-dose po lasix but creatinine continues to climb slowly and BP remains very soft despite midodrine 10 tid. I had a long talk with him today about his prognosis. Currently he is maintaining his volume status on high-dose oral lasix but I suspect he will need dialysis again in the next few weeks to months. It remains to be seen whether he can tolerate this with his low EF and low BP.   For now, I think if his renal function is relatively stable in am can go to CIR so he can continue to get stronger and we will continue to discuss next steps from HF and renal perspective.   Glori Bickers, MD  8:23 PM

## 2017-07-28 NOTE — Progress Notes (Signed)
RT went into patients room to see if patient wanted to wear cpap tonight, he refused.

## 2017-07-28 NOTE — Progress Notes (Signed)
Physical Therapy Treatment Patient Details Name: [name] MRN: [id number] DOB: October 01, 1956 Today's Date: 07/28/2017    History of Present Illness 61 yr old male with extensive PMHx MI, ICM, HFrEF EF15%-20% ,BiVICD, CKD Stage IV, DM, GERD, OSA on CPAP, PAF, presents with weight gain, lower extremity swelling and decreased urine output within the last week.   Work up included AOCsHF/cardiogenic shock, BiV MSSA, demand ischemia.  Pt continues on CRRT.    PT Comments    Patient tolerated short distance gait this session with min/mod A +2 with RW. Pt reported decreased pain in R hip today. Current plan remains appropriate.    Follow Up Recommendations  CIR;Supervision/Assistance - 24 hour     Equipment Recommendations  Other (comment) (TBA)    Recommendations for Other Services Rehab consult     Precautions / Restrictions Precautions Precautions: Fall Restrictions Weight Bearing Restrictions: No    Mobility  Bed Mobility Overal bed mobility: Needs Assistance Bed Mobility: Supine to Sit     Supine to sit: Mod assist;HOB elevated     General bed mobility comments: use of bed rail; cues for sequencing and technique; assist to bring bilat LE from EOB, scoot hips with use of bed pad, and elevate trunk into sitting  Transfers Overall transfer level: Needs assistance Equipment used: Rolling walker (2 wheeled) Transfers: Sit to/from Stand Sit to Stand: From elevated surface;Mod assist;+2 safety/equipment         General transfer comment: cues for safe hand placement and technique; assist to power up into standing and to gain balance upon stand with multimodal cues for posture and forward gaze to decrease tendency of posterior bias  Ambulation/Gait Ambulation/Gait assistance: +2 safety/equipment;Min assist;Mod assist;+2 physical assistance Ambulation Distance (Feet): 12 Feet Assistive device: Rolling walker (2 wheeled) Gait Pattern/deviations: Step-through  pattern;Decreased step length - right;Decreased step length - left;Trunk flexed;Narrow base of support Gait velocity: decreased   General Gait Details: multimodal cues for posture and sequencing; more assist required initially for balance and management of RW    Stairs            Wheelchair Mobility    Modified Rankin (Stroke Patients Only)       Balance Overall balance assessment: Needs assistance Sitting-balance support: Feet supported;No upper extremity supported Sitting balance-Leahy Scale: Fair     Standing balance support: Bilateral upper extremity supported Standing balance-Leahy Scale: Poor                              Cognition Arousal/Alertness: Awake/alert Behavior During Therapy: WFL for tasks assessed/performed Overall Cognitive Status: Within Functional Limits for tasks assessed                                        Exercises      General Comments        Pertinent Vitals/Pain Pain Assessment: Faces Faces Pain Scale: Hurts a little bit Pain Location: R hip with mobility/weightbearing Pain Descriptors / Indicators: Guarding;Discomfort;Sore Pain Intervention(s): Limited activity within patient's tolerance;Monitored during session;Repositioned    Home Living                      Prior Function            PT Goals (current goals can now be found in the care plan section) Acute Rehab PT Goals  PT Goal Formulation: With patient Time For Goal Achievement: 07/29/17 Potential to Achieve Goals: Good Progress towards PT goals: Progressing toward goals    Frequency    Min 3X/week      PT Plan Current plan remains appropriate    Co-evaluation              AM-PAC PT "6 Clicks" Daily Activity  Outcome Measure  Difficulty turning over in bed (including adjusting bedclothes, sheets and blankets)?: Unable Difficulty moving from lying on back to sitting on the side of the bed? : Unable Difficulty  sitting down on and standing up from a chair with arms (e.g., wheelchair, bedside commode, etc,.)?: Unable Help needed moving to and from a bed to chair (including a wheelchair)?: A Little Help needed walking in hospital room?: A Lot Help needed climbing 3-5 steps with a railing? : A Lot 6 Click Score: 10    End of Session Equipment Utilized During Treatment: Gait belt Activity Tolerance: Patient limited by fatigue;Patient tolerated treatment well Patient left: with call bell/phone within reach;in chair Nurse Communication: Mobility status PT Visit Diagnosis: Other abnormalities of gait and mobility (R26.89);Muscle weakness (generalized) (M62.81);Pain Pain - Right/Left: Right Pain - part of body: Hip     Time: 0912-0939 PT Time Calculation (min) (ACUTE ONLY): 27 min  Charges:  $Gait Training: 8-22 mins $Therapeutic Activity: 8-22 mins                    G Codes:       Earney Navy, PTA Pager: [phone]     Darliss Cheney 07/28/2017, 12:04 PM

## 2017-07-29 DIAGNOSIS — E785 Hyperlipidemia, unspecified: Secondary | ICD-10-CM

## 2017-07-29 LAB — GLUCOSE, CAPILLARY
GLUCOSE-CAPILLARY: 89 mg/dL (ref 65–99)
GLUCOSE-CAPILLARY: 103 mg/dL — AB (ref 65–99)
Glucose-Capillary: 144 mg/dL — ABNORMAL HIGH (ref 65–99)
Glucose-Capillary: 123 mg/dL — ABNORMAL HIGH (ref 65–99)

## 2017-07-29 LAB — CBC
HCT: 26.9 % — ABNORMAL LOW (ref 39.0–52.0)
Hemoglobin: 8.5 g/dL — ABNORMAL LOW (ref 13.0–17.0)
MCH: 28.7 pg (ref 26.0–34.0)
MCHC: 31.6 g/dL (ref 30.0–36.0)
MCV: 90.9 fL (ref 78.0–100.0)
PLATELETS: 102 10*3/uL — AB (ref 150–400)
RBC: 2.96 MIL/uL — AB (ref 4.22–5.81)
RDW: 17.5 % — ABNORMAL HIGH (ref 11.5–15.5)
WBC: 5.7 10*3/uL (ref 4.0–10.5)

## 2017-07-29 LAB — RENAL FUNCTION PANEL
ALBUMIN: 1.6 g/dL — AB (ref 3.5–5.0)
ANION GAP: 12 (ref 5–15)
BUN: 70 mg/dL — AB (ref 6–20)
CALCIUM: 8.8 mg/dL — AB (ref 8.9–10.3)
CO2: 22 mmol/L (ref 22–32)
CREATININE: 4.1 mg/dL — AB (ref 0.61–1.24)
Chloride: 96 mmol/L — ABNORMAL LOW (ref 101–111)
GFR calc Af Amer: 17 mL/min — ABNORMAL LOW (ref >60)
GFR calc non Af Amer: 14 mL/min — ABNORMAL LOW (ref >60)
GLUCOSE: 110 mg/dL — AB (ref 65–99)
PHOSPHORUS: 6.3 mg/dL — AB (ref 2.5–4.6)
Potassium: 3.8 mmol/L (ref 3.5–5.1)
Sodium: 130 mmol/L — ABNORMAL LOW (ref 135–145)

## 2017-07-29 LAB — COOXEMETRY PANEL
Carboxyhemoglobin: 1.9 % — ABNORMAL HIGH (ref 0.5–1.5)
Methemoglobin: 1.1 % (ref 0.0–1.5)
O2 SAT: 62.6 %
Total hemoglobin: 9.1 g/dL — ABNORMAL LOW (ref 12.0–16.0)

## 2017-07-29 LAB — PROTIME-INR
INR: 3.26
PROTHROMBIN TIME: 34 s — AB (ref 11.4–15.2)

## 2017-07-29 LAB — MAGNESIUM: Magnesium: 2.3 mg/dL (ref 1.7–2.4)

## 2017-07-29 MED ORDER — CEFAZOLIN SODIUM-DEXTROSE 2-4 GM/100ML-% IV SOLN
2.0000 g | INTRAVENOUS | Status: DC
  Administered 2017-07-29 – 2017-08-01 (×4): 2 g via INTRAVENOUS
  Filled 2017-07-29 (×6): qty 100

## 2017-07-29 NOTE — Progress Notes (Signed)
Subjective: Interval History: has no complaint, concerned about heart with dialysis.  Objective: Vital signs in last 24 hours: Temp:  [97.3 F (36.3 C)-98 F (36.7 C)] 97.4 F (36.3 C) (08/24 0400) Pulse Rate:  [72-73] 73 (08/24 0400) Resp:  [9-19] 10 (08/24 0400) BP: (86-106)/(57-59) 92/57 (08/24 0400) SpO2:  [97 %-100 %] 98 % (08/24 0400) Weight:  [81.9 kg (180 lb 8 oz)] 81.9 kg (180 lb 8 oz) (08/24 0400) Weight change: -0.626 kg (-1 lb 6.1 oz)  Intake/Output from previous day: 08/23 0701 - 08/24 0700 In: 710 [P.O.:600; I.V.:10; IV Piggyback:100] Out: 1875 [Urine:1875] Intake/Output this shift: No intake/output data recorded.  General appearance: alert, cooperative, no distress and pale Neck: RIJ iv Resp: diminished breath sounds bilaterally and rales bibasilar Chest wall: Pacer L [place] Cardio: irregularly irregular rhythm and systolic murmur: holosystolic 2/6, blowing at apex GI: mild distension , pos bs, ostomy L mid abdm Extremities: edema 3+  Lab Results:  Recent Labs  07/28/17 0430 07/29/17 0456  WBC 7.2 5.7  HGB 8.7* 8.5*  HCT 27.6* 26.9*  PLT 113* 102*   BMET:  Recent Labs  07/28/17 0430 07/29/17 0456  NA 129* 130*  K 4.3 3.8  CL 96* 96*  CO2 22 22  GLUCOSE 137* 110*  BUN 61* 70*  CREATININE 3.83* 4.10*  CALCIUM 8.6* 8.8*   No results for input(s): PTH in the last 72 hours. Iron Studies: No results for input(s): IRON, TIBC, TRANSFERRIN, FERRITIN in the last 72 hours.  Studies/Results: Ir Fluoro Guide Cv Line Right  Result Date: 07/28/2017 INDICATION: IV access required for infection EXAM: RIGHT JUGULAR TUNNELED PICC LINE PLACEMENT WITH ULTRASOUND AND FLUOROSCOPIC GUIDANCE MEDICATIONS: None ANESTHESIA/SEDATION: None FLUOROSCOPY TIME:  Fluoroscopy Time:  minutes 48 seconds (6 mGy). COMPLICATIONS: None immediate. PROCEDURE: The patient was advised of the possible risks and complications and agreed to undergo the procedure. The patient was then brought  to the angiographic suite for the procedure. The right neck was prepped with chlorhexidine, draped in the usual sterile fashion using maximum barrier technique (cap and mask, sterile gown, sterile gloves, large sterile sheet, hand hygiene and cutaneous antiseptic). Local anesthesia was attained by infiltration with 1% lidocaine. Ultrasound demonstrated patency of the right jugular vein, and this was documented with an image. Under real-time ultrasound guidance, this vein was accessed with a 21 gauge micropuncture needle and image documentation was performed. The needle was exchanged over a guidewire for a peel-away sheath through which a 20 cm 6 [place] double lumen power injectable PICC was advanced, and positioned with its tip at the lower SVC/right atrial junction. The cuff was positioned in the subcutaneous tract. Fluoroscopy during the procedure and fluoro spot radiograph confirms appropriate catheter position. The catheter was flushed, secured to the skin with Prolene sutures, and covered with a sterile dressing. IMPRESSION: Successful placement of a right jugular tunneled PICC with sonographic and fluoroscopic guidance. The catheter is ready for use. Electronically Signed   By: Marybelle Killings M.D.   On: 07/28/2017 11:24   Ir US Guide Vasc Access Right  Result Date: 07/28/2017 INDICATION: IV access required for infection EXAM: RIGHT JUGULAR TUNNELED PICC LINE PLACEMENT WITH ULTRASOUND AND FLUOROSCOPIC GUIDANCE MEDICATIONS: None ANESTHESIA/SEDATION: None FLUOROSCOPY TIME:  Fluoroscopy Time:  minutes 48 seconds (6 mGy). COMPLICATIONS: None immediate. PROCEDURE: The patient was advised of the possible risks and complications and agreed to undergo the procedure. The patient was then brought to the angiographic suite for the procedure. The right neck was prepped  with chlorhexidine, draped in the usual sterile fashion using maximum barrier technique (cap and mask, sterile gown, sterile gloves, large sterile sheet,  hand hygiene and cutaneous antiseptic). Local anesthesia was attained by infiltration with 1% lidocaine. Ultrasound demonstrated patency of the right jugular vein, and this was documented with an image. Under real-time ultrasound guidance, this vein was accessed with a 21 gauge micropuncture needle and image documentation was performed. The needle was exchanged over a guidewire for a peel-away sheath through which a 20 cm 6 [place] double lumen power injectable PICC was advanced, and positioned with its tip at the lower SVC/right atrial junction. The cuff was positioned in the subcutaneous tract. Fluoroscopy during the procedure and fluoro spot radiograph confirms appropriate catheter position. The catheter was flushed, secured to the skin with Prolene sutures, and covered with a sterile dressing. IMPRESSION: Successful placement of a right jugular tunneled PICC with sonographic and fluoroscopic guidance. The catheter is ready for use. Electronically Signed   By: Marybelle Killings M.D.   On: 07/28/2017 11:24    I have reviewed the patient's current medications.  Assessment/Plan: 1 CKD 4-5 worsening daily, vol xs. Diuresing, vol xs.  If worse tomorrow, start IHD and see if tol . Discussed may not tolerate 2 CM 3 Afib 4 Anemia 5 HPHT 6 Cirrhosis 7 MSSA sepsis Ancef 8 Debill P Follow chem , cont diuretics, if worse, trial HD    LOS: 19 days   Jerold Yoss L 07/29/2017,7:13 AM

## 2017-07-29 NOTE — Progress Notes (Signed)
PROGRESS NOTE    [name]  QVZ:[id number] DOB: December 04, 1956 DOA: 07/09/2017 PCP: Elfredia Nevins, MD    Brief Narrative:  61 y.o.male with history of ischemic cardiomyopathy status post ICD placement. Also history of chronic renal failure. Admitted on 8/4 with acute respiratory failure secondary to pulmonary edema. Subsequently found to have a MSSA bacteremia. Patient has had persistent shock requiring vasopressor support and is on renal replacement therapy. He was initially admitted to [facility] and transferred to [facility] service on 8/20.  Patient now significantly volume overloaded on high-dose oral diuretics and Zaroxolyn with worsening renal function. Patient initially received CVVHD which was subsequently discontinued. Due to worsening renal function nephrology considering starting patient back on hemodialysis 07/30/2017, if patient is able to tolerate it.   Assessment & Plan:   Principal Problem:   Staphylococcus aureus bacteremia with sepsis [name]) Active Problems:   Ischemic cardiomyopathy-EF 35% 04/24/15 echo   Long term current use of anticoagulant therapy   CAD with LCX disease and stents to LAD   Chronic a-fib (HCC)   Chronic kidney disease, stage IV (severe) (HCC)   OSA on CPAP   Hyperlipidemia   BiV ICD ([employer] Jude) gen change 04/10/15   Chronic atrial fibrillation (HCC)   Right heart failure (HCC)   Septic shock (HCC)   Cardiorenal syndrome with renal failure   Infected defibrillator (HCC)   Septic hip (HCC)   Severe sepsis with septic shock (HCC)   Bacteremia due to methicillin susceptible Staphylococcus aureus (MSSA)  #1 MSSA bacteremia/sepsis Patient patient improving clinically. Patient afebrile. Normal white count. Repeat blood cultures from 07/11/2017 negative to date.TEE 07/19/2017 negative for endocarditis or vegetation on valves/ICD. Patient status post double-lumen central line and interventional radiology. Patient is to receive 6 weeks of IV antibiotics and then  subsequently be converted to chronic suppressive oral cephalexin per ID recommendations. Patient will follow-up with ID in the outpatient setting.   #2 acute on chronic systolic heart failure/cardiogenic shock Patient with a poor ejection fraction. 2-D echo from August 2018 with a EF of 15-20%. Patient also noted with low blood pressure with systolics now in the 90s. Patient with low blood pressure felt to be secondary to sepsis and cardiogenic shock. Patient clinically volume overloaded. Beta blocker on hold secondary to acute decompensation. Unable to place an ACE inhibitor or ARB due to worsening renal function. Patient with a urine output of 1.875 L over the past 24 hours and patient is -13.8 L during this hospitalization. Continue current dose of Lasix 160 mg orally 3 times daily and Zaroxolyn. Midrin dose has been increased to 10 mg 3 times daily for low blood pressure. Cardiology/heart failure team following and appreciate input and recommendations.  #3 acute on chronic kidney disease stage IV Patient with worsening renal function and creatinine currently at 4.10 from 3.83 on 07/28/2017 and rising. Patient did receive CVVHD early on in the hospitalization which has subsequently been discontinued. Patient with a urine output of 1.875 L over the past 24 hours on diuretics. Blood pressure is low. Patient on middle drain 10 mg 3 times daily for low blood pressure. Nephrology following and contemplating intermittent hemodialysis tomorrow if continued worsening renal function if patient is able to tolerate it. Per nephrology.  #4 cirrhosis Per CT abdomen and pelvis. patient on diuretics. Outpatient follow-up.   #5 well-controlled DM 2 Hemoglobin A1c 6.6. CBGs have ranged from 105-144. Continue current regimen of Lantus and sliding scale insulin.  #6 history of gout Stable. No complaints.  Continue colchicine.   #7 anemia Anemia of chronic kidney disease. Patient status post transfusion of 2 units  packed red blood cells 07/22/2017. Hemoglobin stable. Patient receiving Aranesp.   #8 chronic atrial fibrillation Rate is currently controlled. Continue Coumadin for anticoagulation. Per cardiology.   #9 H/O rectal CA with diverting colostomy Stable. Outpatient follow-up.  DVT prophylaxis: coumadin Code Status: full Family Communication: Updated patient. No family at bedside. Disposition Plan: CIR versus skilled nursing facility pending hospital outcome.   Consultants:   CIR: Dr. Hermelinda Medicus 07/26/2017   electrophysiology: Dr. Johney Frame 07/14/2017  Cardiology/heart failure Dr.Qureshi 07/10/2017  Nephrology: Dr. Eliott Nine 07/11/2017  Infectious disease: Dr. Daiva Eves 07/11/2017  Procedures: TEE 8/14: EF 20% with diffuse hypokinesis. Moderate hypokinesis of RV. ICD/pacing wires noted without vegetation. Left atrium dilated without thrombus. Right atrium dilated and pacing wires without vegetation. Mild aortic insufficiency without vegetation. Mild mitral regurgitation without vegetation. Moderate to severe tricuspid regurgitation without vegetation. Pulmonic valve without vegetation. No PFO or ASD. No pericardial effusion. CT right hip 07/11/2017 CT abdomen and pelvis 07/10/2017 2-D echo 07/10/2017 Transfused 2 units packed red blood cells 07/22/2017 Right IJV tunneled PICC per Dr. Bonnielee Haff 07/27/2017   SIGNIFICANT EVENTS: 08/04 - Admit 08/05 - Started on CVVHD 08/15 - CVVHD stopped  8/20 - transfer to [facility] service.    Antimicrobials:   IV Ancef 07/10/2017   IV vancomycin 07/10/2017>>> 07/11/2017   Subjective: Laying in bed. Denies shortness of breath. No chest pain. Patient states nephrology contemplating hemodialysis tomorrow if his heart can tolerate it   Objective: Vitals:   07/28/17 2324 07/28/17 2335 07/29/17 0400 07/29/17 0800  BP:  (!) 106/58 (!) 92/57   Pulse:  73 73   Resp:  17 10   Temp: (!) 97.3 F (36.3 C)  (!) 97.4 F (36.3 C) (!) 97.5 F (36.4 C)  TempSrc:  Oral  Oral Oral  SpO2:  100% 98%   Weight:   81.9 kg (180 lb 8 oz)   Height:        Intake/Output Summary (Last 24 hours) at 07/29/17 1011 Last data filed at 07/29/17 1000  Gross per 24 hour  Intake             1370 ml  Output             2025 ml  Net             -655 ml   Filed Weights   07/27/17 0320 07/28/17 0447 07/29/17 0400  Weight: 82.1 kg (181 lb) 82.5 kg (181 lb 14.1 oz) 81.9 kg (180 lb 8 oz)    Examination:  General exam: In bed. Respiratory system: Crackles in the bases. No wheezing.  Respiratory effort normal. Cardiovascular system: S1 & S2 heard, RRR. 3/6 SEM. 1-2+ bilateral LE edema up to hips. Gastrointestinal system: Abdomen is soft, NT, ND,+ BS. Central nervous system: Alert and oriented. No focal neurological deficits. Extremities: BLE edema. Skin: Bandages on lower shins. Chronic venous stasis changes.  Psychiatry: Judgement and insight is fair. Mood & affect appropriate.     Data Reviewed: I have personally reviewed following labs and imaging studies  CBC:  Recent Labs Lab 07/25/17 0351 07/26/17 0409 07/27/17 0356 07/28/17 0430 07/29/17 0456  WBC 8.6 7.1 6.7 7.2 5.7  HGB 9.0* 8.6* 8.7* 8.7* 8.5*  HCT 27.9* 26.9* 27.2* 27.6* 26.9*  MCV 91.2 89.7 91.6 90.5 90.9  PLT 124* 105* 105* 113* 102*   Basic Metabolic Panel:  Recent Labs Lab 07/25/17  1950 07/26/17 0409 07/27/17 0356 07/28/17 0430 07/29/17 0456  NA 128* 128* 131* 129* 130*  K 3.7 3.5 3.4* 4.3 3.8  CL 96* 97* 98* 96* 96*  CO2 22 21* 23 22 22   GLUCOSE 127* 92 104* 137* 110*  BUN 48* 53* 55* 61* 70*  CREATININE 3.13* 3.21* 3.44* 3.83* 4.10*  CALCIUM 7.9* 8.1* 8.4* 8.6* 8.8*  MG 1.9 1.9 1.9 1.9 2.3  PHOS 4.9* 5.2* 6.0* 6.3* 6.3*   GFR: Estimated Creatinine Clearance: 19.3 mL/min (A) (by C-G formula based on SCr of 4.1 mg/dL (H)). Liver Function Tests:  Recent Labs Lab 07/26/17 0409 07/26/17 0930 07/27/17 0356 07/28/17 0430 07/29/17 0456  AST  --  22  --   --   --     ALT  --  <5*  --   --   --   ALKPHOS  --  102  --   --   --   BILITOT  --  1.2  --   --   --   PROT  --  6.6  --   --   --   ALBUMIN 1.6* 1.8* 1.7* 1.8* 1.6*   No results for input(s): LIPASE, AMYLASE in the last 168 hours. No results for input(s): AMMONIA in the last 168 hours. Coagulation Profile:  Recent Labs Lab 07/25/17 0351 07/26/17 0409 07/27/17 0356 07/28/17 0430 07/29/17 0456  INR 1.55 2.17 2.75 3.35 3.26   Cardiac Enzymes: No results for input(s): CKTOTAL, CKMB, CKMBINDEX, TROPONINI in the last 168 hours. BNP (last 3 results) No results for input(s): PROBNP in the last 8760 hours. HbA1C:  Recent Labs  07/28/17 0500  HGBA1C 6.6*   CBG:  Recent Labs Lab 07/28/17 0813 07/28/17 1250 07/28/17 1704 07/28/17 2141 07/29/17 0825  GLUCAP 132* 107* 93 105* 144*   Lipid Profile: No results for input(s): CHOL, HDL, LDLCALC, TRIG, CHOLHDL, LDLDIRECT in the last 72 hours. Thyroid Function Tests: No results for input(s): TSH, T4TOTAL, FREET4, T3FREE, THYROIDAB in the last 72 hours. Anemia Panel: No results for input(s): VITAMINB12, FOLATE, FERRITIN, TIBC, IRON, RETICCTPCT in the last 72 hours. Sepsis Labs: No results for input(s): PROCALCITON, LATICACIDVEN in the last 168 hours.  No results found for this or any previous visit (from the past 240 hour(s)).       Radiology Studies: Ir Fluoro Guide Cv Line Right  Result Date: 07/28/2017 INDICATION: IV access required for infection EXAM: RIGHT JUGULAR TUNNELED PICC LINE PLACEMENT WITH ULTRASOUND AND FLUOROSCOPIC GUIDANCE MEDICATIONS: None ANESTHESIA/SEDATION: None FLUOROSCOPY TIME:  Fluoroscopy Time:  minutes 48 seconds (6 mGy). COMPLICATIONS: None immediate. PROCEDURE: The patient was advised of the possible risks and complications and agreed to undergo the procedure. The patient was then brought to the angiographic suite for the procedure. The right neck was prepped with chlorhexidine, draped in the usual sterile  fashion using maximum barrier technique (cap and mask, sterile gown, sterile gloves, large sterile sheet, hand hygiene and cutaneous antiseptic). Local anesthesia was attained by infiltration with 1% lidocaine. Ultrasound demonstrated patency of the right jugular vein, and this was documented with an image. Under real-time ultrasound guidance, this vein was accessed with a 21 gauge micropuncture needle and image documentation was performed. The needle was exchanged over a guidewire for a peel-away sheath through which a 20 cm 6 [place] double lumen power injectable PICC was advanced, and positioned with its tip at the lower SVC/right atrial junction. The cuff was positioned in the subcutaneous tract. Fluoroscopy during the procedure and fluoro  spot radiograph confirms appropriate catheter position. The catheter was flushed, secured to the skin with Prolene sutures, and covered with a sterile dressing. IMPRESSION: Successful placement of a right jugular tunneled PICC with sonographic and fluoroscopic guidance. The catheter is ready for use. Electronically Signed   By: Jolaine Click M.D.   On: 07/28/2017 11:24   Ir US Guide Vasc Access Right  Result Date: 07/28/2017 INDICATION: IV access required for infection EXAM: RIGHT JUGULAR TUNNELED PICC LINE PLACEMENT WITH ULTRASOUND AND FLUOROSCOPIC GUIDANCE MEDICATIONS: None ANESTHESIA/SEDATION: None FLUOROSCOPY TIME:  Fluoroscopy Time:  minutes 48 seconds (6 mGy). COMPLICATIONS: None immediate. PROCEDURE: The patient was advised of the possible risks and complications and agreed to undergo the procedure. The patient was then brought to the angiographic suite for the procedure. The right neck was prepped with chlorhexidine, draped in the usual sterile fashion using maximum barrier technique (cap and mask, sterile gown, sterile gloves, large sterile sheet, hand hygiene and cutaneous antiseptic). Local anesthesia was attained by infiltration with 1% lidocaine. Ultrasound  demonstrated patency of the right jugular vein, and this was documented with an image. Under real-time ultrasound guidance, this vein was accessed with a 21 gauge micropuncture needle and image documentation was performed. The needle was exchanged over a guidewire for a peel-away sheath through which a 20 cm 6 [place] double lumen power injectable PICC was advanced, and positioned with its tip at the lower SVC/right atrial junction. The cuff was positioned in the subcutaneous tract. Fluoroscopy during the procedure and fluoro spot radiograph confirms appropriate catheter position. The catheter was flushed, secured to the skin with Prolene sutures, and covered with a sterile dressing. IMPRESSION: Successful placement of a right jugular tunneled PICC with sonographic and fluoroscopic guidance. The catheter is ready for use. Electronically Signed   By: Jolaine Click M.D.   On: 07/28/2017 11:24        Scheduled Meds: . colchicine  0.3 mg Oral Daily  . darbepoetin (ARANESP) injection - NON-DIALYSIS  200 mcg Subcutaneous Q Wed-1800  . furosemide  160 mg Oral TID  . insulin aspart  0-5 Units Subcutaneous QHS  . insulin aspart  0-9 Units Subcutaneous TID WC  . insulin aspart  3 Units Subcutaneous TID WC  . insulin glargine  10 Units Subcutaneous QHS  . metolazone  10 mg Oral Daily  . midodrine  10 mg Oral TID WC  . sodium chloride flush  10-40 mL Intracatheter Q12H  . Warfarin - Pharmacist Dosing Inpatient   Does not apply q1800   Continuous Infusions: . sodium chloride 250 mL (07/24/17 1900)  .  ceFAZolin (ANCEF) IV Stopped (07/29/17 0615)     LOS: 19 days    Time spent: 35 minutes    Olamide Carattini, MD [facility] Pager [phone]  If 7PM-7AM, please contact night-coverage [email or web address] Password [facility] 07/29/2017, 10:11 AM

## 2017-07-29 NOTE — Progress Notes (Signed)
Rehab admissions - Continuing to follow for progress and medical readiness.  Not ready for inpatient rehab today.  Noted plans for possible HD tomorrow.  I will check back on Monday.  Call me for questions.  #[phone]

## 2017-07-29 NOTE — Progress Notes (Signed)
Occupational Therapy Treatment Patient Details Name: [name] MRN: [id number] DOB: 08-17-1956 Today's Date: 07/29/2017    History of present illness 61 yr old male with extensive PMHx MI, ICM, HFrEF EF15%-20% ,BiVICD, CKD Stage IV, DM, GERD, OSA on CPAP, PAF, presents with weight gain, lower extremity swelling and decreased urine output within the last week.   Work up included AOCsHF/cardiogenic shock, BiV MSSA, demand ischemia.  Pt continues on CRRT.   OT comments  Pt continues to be very motivated and is making slow, but steady progress toward his goals.  She require mod - max A for ADLs.  Will continue to follow.   Follow Up Recommendations  CIR;Supervision/Assistance - 24 hour    Equipment Recommendations  3 in 1 bedside commode;Tub/shower bench    Recommendations for Other Services Rehab consult    Precautions / Restrictions Precautions Precautions: Fall Precaution Comments: Watch BP       Mobility Bed Mobility Overal bed mobility: Needs Assistance Bed Mobility: Supine to Sit       Sit to supine: Mod assist   General bed mobility comments: Pt requires assist to move LEs off bed and to lift trunk.  He moves slowly and requires rest breaks  Transfers Overall transfer level: Needs assistance Equipment used: Rolling walker (2 wheeled) Transfers: Sit to/from [facility] Sit to Stand: Mod assist;From elevated surface Stand pivot transfers: Min assist       General transfer comment: requires assist to power up into standing and assist for balance     Balance Overall balance assessment: Needs assistance Sitting-balance support: Feet supported;No upper extremity supported Sitting balance-Leahy Scale: Fair     Standing balance support: Bilateral upper extremity supported Standing balance-Leahy Scale: Poor Standing balance comment: requires bil. Ue support                            ADL either performed or assessed with clinical  judgement   ADL Overall ADL's : Needs assistance/impaired                         Toilet Transfer: Moderate assistance;Stand-pivot;BSC;RW   Toileting- Clothing Manipulation and Hygiene: Maximal assistance;Sit to/from stand       Functional mobility during ADLs: Moderate assistance;Rolling walker       Vision       Perception     Praxis      Cognition Arousal/Alertness: Awake/alert Behavior During Therapy: WFL for tasks assessed/performed Overall Cognitive Status: Within Functional Limits for tasks assessed                                          Exercises     Shoulder Instructions       General Comments VSS    Pertinent Vitals/ Pain       Pain Assessment: Faces Faces Pain Scale: Hurts a little bit Pain Location: R hip with mobility/weightbearing Pain Descriptors / Indicators: Guarding;Discomfort;Sore Pain Intervention(s): Limited activity within patient's tolerance;Monitored during session  Home Living                                          Prior Functioning/Environment  Frequency  Min 2X/week        Progress Toward Goals  OT Goals(current goals can now be found in the care plan section)  Progress towards OT goals: Progressing toward goals  Acute Rehab OT Goals Patient Stated Goal: be able to deer hunt and fish OT Goal Formulation: With patient Time For Goal Achievement: 08/12/17 Potential to Achieve Goals: Good ADL Goals Pt Will Perform Grooming: with min assist;standing Pt Will Perform Upper Body Bathing: with min assist;sitting Pt Will Perform Lower Body Bathing: with mod assist;sit to/from stand Pt Will Perform Upper Body Dressing: with adaptive equipment;sitting;standing;with min assist Pt Will Perform Lower Body Dressing: with mod assist;sit to/from stand Pt Will Transfer to Toilet: with min assist;ambulating;regular height toilet;bedside commode;grab bars Pt Will Perform  Toileting - Clothing Manipulation and hygiene: with min assist;sit to/from stand Pt/caregiver will Perform Home Exercise Program: Increased strength;Right Upper extremity;Left upper extremity;With theraband;Independently;With written HEP provided  Plan Discharge plan remains appropriate    Co-evaluation                 AM-PAC PT "6 Clicks" Daily Activity     Outcome Measure   Help from another person eating meals?: A Little Help from another person taking care of personal grooming?: A Little Help from another person toileting, which includes using toliet, bedpan, or urinal?: A Lot Help from another person bathing (including washing, rinsing, drying)?: A Lot Help from another person to put on and taking off regular upper body clothing?: A Lot Help from another person to put on and taking off regular lower body clothing?: A Lot 6 Click Score: 14    End of Session Equipment Utilized During Treatment: Rolling walker  OT Visit Diagnosis: Unsteadiness on feet (R26.81);Muscle weakness (generalized) (M62.81)   Activity Tolerance Patient limited by pain   Patient Left in chair;with call bell/phone within reach   Nurse Communication Mobility status        Time: 1415-1444 OT Time Calculation (min): 29 min  Charges: OT General Charges $OT Visit: 1 Procedure OT Treatments $Therapeutic Activity: 23-37 mins  [facility], OTR/L [phone]    Lucille Passy M 07/29/2017, 3:38 PM

## 2017-07-29 NOTE — Progress Notes (Signed)
Patient continues to refuse cpap.

## 2017-07-29 NOTE — Clinical Social Work Note (Signed)
CSW continues to follow patient for discharge needs.  Dayton Scrape, [place]

## 2017-07-29 NOTE — Progress Notes (Signed)
ANTICOAGULATION CONSULT NOTE - Follow Up Consult  Pharmacy Consult for Warfarin Indication: atrial fibrillation  No Known Allergies  Patient Measurements: Height: 5\' 10"  (177.8 cm) Weight: 180 lb 8 oz (81.9 kg) IBW/kg (Calculated) : 73  Vital Signs: Temp: 97.5 F (36.4 C) (08/24 0800) Temp Source: Oral (08/24 0800) BP: 92/57 (08/24 0400) Pulse Rate: 73 (08/24 0400)  Labs:  Recent Labs  07/27/17 0356 07/28/17 0430 07/29/17 0456  HGB 8.7* 8.7* 8.5*  HCT 27.2* 27.6* 26.9*  PLT 105* 113* 102*  LABPROT 29.7* 34.7* 34.0*  INR 2.75 3.35 3.26  CREATININE 3.44* 3.83* 4.10*    Estimated Creatinine Clearance: 19.3 mL/min (A) (by C-G formula based on SCr of 4.1 mg/dL (H)).   Assessment: 61 yo male with Afib, Coumadin restarted 8/19.  No bleeding or complications noted.  CBC stable.  INR still above goal today despite holding Coumadin dose last night.  Goal of Therapy:  INR 2-3 Monitor platelets by anticoagulation protocol: Yes   Plan:  No Coumadin again tonight.  Will likely need to resume tomorrow. Daily INR.   Uvaldo Rising, BCPS  Clinical Pharmacist Pager [phone]  07/29/2017 11:09 AM

## 2017-07-29 NOTE — Progress Notes (Signed)
Pharmacy Antibiotic Note  [name] is a 61 y.o. male admitted on 07/09/2017 with MSSA bacteremia.  Pharmacy has been consulted for cefazolin dosing.  CRRT discontinued.  Scr rising slowly now.  Hard to estimate CrCl.  Planning IHD tomorrow.  Still making urine.  Plan: Change cefazolin to 2g q 24 hrs for now. F/u HD schedule and residual renal function.  Height: 5\' 10"  (177.8 cm) Weight: 180 lb 8 oz (81.9 kg) IBW/kg (Calculated) : 73  Temp (24hrs), Avg:97.5 F (36.4 C), Min:97.3 F (36.3 C), Max:97.7 F (36.5 C)   Recent Labs Lab 07/25/17 0351 07/26/17 0409 07/27/17 0356 07/28/17 0430 07/29/17 0456  WBC 8.6 7.1 6.7 7.2 5.7  CREATININE 3.13* 3.21* 3.44* 3.83* 4.10*    Estimated Creatinine Clearance: 19.3 mL/min (A) (by C-G formula based on SCr of 4.1 mg/dL (H)).    No Known Allergies  Antimicrobials this admission: Vanc 8/4 x1 Zosyn 8/4>> 8/5 Cefazolin 8/5 >>(9/16) -6weeks ancef then chronic keflex  Rifampin 8/5 >>8/8 (d/c'd d/t elevated INR and hyperbilirubinemia)   Dose adjustments this admission:  Microbiology results: 8/4 bld x2: MSSA in 2/2 8/4 urine: NG 8/5 MRSA PCR: neg 8/6 bld x2>ng  Thank you for allowing pharmacy to be a part of this patient's care.  Uvaldo Rising, BCPS  Clinical Pharmacist Pager [phone]  07/29/2017 11:16 AM

## 2017-07-29 NOTE — Progress Notes (Signed)
Patient ID: [name], male   DOB: 1956-05-28, 61 y.o.   MRN: [id number]     Advanced Heart Failure Rounding Note  Primary Cardiologist: Levon Penning  Subjective:    Admitted with staph bacteremia with mixed septic/cardiogenic shock.   TEE 07/19/17 with LVEF 20%. No evidence of endocarditis or vegetation on valves/ICD  CVVD stopped 07/20/17.   Renal  function continues to worsen. Remain on high dose diuretics.   Complaining of leg fatigue. Denies SOB   Objective:   Weight Range: 180 lb 8 oz (81.9 kg) Body mass index is 25.9 kg/m.   Vital Signs:   Temp:  [97.3 F (36.3 C)-97.7 F (36.5 C)] 97.5 F (36.4 C) (08/24 0800) Pulse Rate:  [72-73] 73 (08/24 0400) Resp:  [9-17] 10 (08/24 0400) BP: (86-106)/(57-59) 92/57 (08/24 0400) SpO2:  [97 %-100 %] 98 % (08/24 0400) Weight:  [180 lb 8 oz (81.9 kg)] 180 lb 8 oz (81.9 kg) (08/24 0400) Last BM Date: 07/28/17  Weight change: Filed Weights   07/27/17 0320 07/28/17 0447 07/29/17 0400  Weight: 181 lb (82.1 kg) 181 lb 14.1 oz (82.5 kg) 180 lb 8 oz (81.9 kg)    Intake/Output:   Intake/Output Summary (Last 24 hours) at 07/29/17 1008 Last data filed at 07/29/17 1000  Gross per 24 hour  Intake             1370 ml  Output             2025 ml  Net             -655 ml      Physical Exam   General:  Appears fatigued.  No resp difficulty HEENT: normal Neck: supple. RIJ HD catheter . Carotids 2+ bilat; no bruits. No lymphadenopathy or thryomegaly appreciated. Cor: PMI nondisplaced. Regular rate & rhythm. No rubs, gallops or murmurs. L upper chest ICD  Lungs: clear Abdomen: soft, nontender, nondistended. No hepatosplenomegaly. No bruits or masses. Good bowel sounds. Extremities: no cyanosis, clubbing, rash, R and LLE 3+ edema RUE fistula  Neuro: alert & orientedx3, cranial nerves grossly intact. moves all 4 extremities w/o difficulty. Affect pleasant    Telemetry   BiV pacing 70s underlying rhythm a fib.   Labs     CBC  Recent Labs  07/28/17 0430 07/29/17 0456  WBC 7.2 5.7  HGB 8.7* 8.5*  HCT 27.6* 26.9*  MCV 90.5 90.9  PLT 113* 102*   Basic Metabolic Panel  Recent Labs  07/28/17 0430 07/29/17 0456  NA 129* 130*  K 4.3 3.8  CL 96* 96*  CO2 22 22  GLUCOSE 137* 110*  BUN 61* 70*  CREATININE 3.83* 4.10*  CALCIUM 8.6* 8.8*  MG 1.9 2.3  PHOS 6.3* 6.3*   Liver Function Tests  Recent Labs  07/28/17 0430 07/29/17 0456  ALBUMIN 1.8* 1.6*   No results for input(s): LIPASE, AMYLASE in the last 72 hours. Cardiac Enzymes No results for input(s): CKTOTAL, CKMB, CKMBINDEX, TROPONINI in the last 72 hours.  BNP: BNP (last 3 results)  Recent Labs  06/13/17 1205 07/09/17 2151  BNP 1,151.0* 1,553.9*    ProBNP (last 3 results) No results for input(s): PROBNP in the last 8760 hours.   D-Dimer No results for input(s): DDIMER in the last 72 hours. Hemoglobin A1C  Recent Labs  07/28/17 0500  HGBA1C 6.6*   Fasting Lipid Panel No results for input(s): CHOL, HDL, LDLCALC, TRIG, CHOLHDL, LDLDIRECT in the last 72 hours. Thyroid Function Tests No results  for input(s): TSH, T4TOTAL, T3FREE, THYROIDAB in the last 72 hours.  Invalid input(s): FREET3  Other results:   Imaging   No results found.  Medications:     Scheduled Medications: . colchicine  0.3 mg Oral Daily  . darbepoetin (ARANESP) injection - NON-DIALYSIS  200 mcg Subcutaneous Q Wed-1800  . furosemide  160 mg Oral TID  . insulin aspart  0-5 Units Subcutaneous QHS  . insulin aspart  0-9 Units Subcutaneous TID WC  . insulin aspart  3 Units Subcutaneous TID WC  . insulin glargine  10 Units Subcutaneous QHS  . metolazone  10 mg Oral Daily  . midodrine  10 mg Oral TID WC  . sodium chloride flush  10-40 mL Intracatheter Q12H  . Warfarin - Pharmacist Dosing Inpatient   Does not apply q1800    Infusions: . sodium chloride 250 mL (07/24/17 1900)  .  ceFAZolin (ANCEF) IV Stopped (07/29/17 0615)    PRN  Medications: sodium chloride, acetaminophen, alum & mag hydroxide-simeth, fentaNYL (SUBLIMAZE) injection, HYDROcodone-acetaminophen, lidocaine (PF), magic mouthwash w/lidocaine, ondansetron (ZOFRAN) IV, sodium chloride flush    Patient Profile   [name] is a 61 year old with a history of DMII, SAH 2016, rectal cancer, CKD Stage IV, chronic A fib, S/P AVN ablation, PAF, CAD BMS LAD 2011 BMS LAD 2009, OSA, chronic systolic heart failure, [employer] Jude BiV ICD admitted with AKI and shock with elevated lactate, suspect cardiogenic.   Assessment/Plan   1. Acute on chronic systolic CHF/cardiogenic shock: Last echo in 8/17 with EF 15-20%.  Ischemic cardiomyopathy, has [employer] Jude BiV ICD (despite chronic afib appears to be effectively BiV pacing).  Had recent admission (discharged 06/21/17) requiring milrinone support and diuresis.  Admitted with soft BP (SBP 80s-90s) + elevated lactate.  Suspect mixed septic/cardiogenic shock with MSSA bacteremia.  - CVVHD stopped 07/20/17.  - Remains overloaded. Weight down pound. Continue high dose lasix + metolazone.  -  No beta blocker with decompensation, hypotension.  - No ARB with AKI.  - Nephrology planning for HD tomorrow.    2. AKI on CKD stage 4:  - Creatinine rising. Per nephrology plan for iHD -Continue midodrine.   3. Atrial fibrillation: Chronic - Continue coumadin. No plans to extract device.  -INR 3/26 Discussed with HF Pharm D.   4. CAD:  - No ischemia s/s  - Continue ASA. No statin with recent elevated LFT's    5. ID:  - Blood CX 2/2 MSSA. Antibiotics narrowed, now on Ancef. 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   - Not planning ICD extraction for now due to severe comorbidities. He is device dependent.  - TEE 07/19/17 with no evidence of endocarditis.  -Per ID continue cefazolin until 9/15 the start renally dosed cephalexin.  -PICC placed.    6. H/O Rectal CA with diverting colostomy   7. Type II diabetes:  - Continue  sliding scale.   8. Liver failure:  LFTs elevated on admit .   9. Anemia of chronic disease. - s/p RBCs on 8/17.  - Todays hgb 8.5.   10. Deconditioning-- PT/OT recommending CIR. CIR following but will need to hold off.        If he doesn't tolerate HD will need Palliative Care for GOC and likely Hospice.   Length of Stay: 19  Amy Clegg, NP  07/29/2017, 10:08 AM  Advanced Heart Failure Team Pager [phone] (M-F; 7a - 4p)  Please contact [facility] for night-coverage after hours (4p -7a )  and weekends on [email or web address]  Patient seen and examined with Tonye Becket, NP. We discussed all aspects of the encounter. I agree with the assessment and plan as stated above.   Remains volume overload despite high-dose po lasix. Creatinine continues to worsen. Renal planning attempt at [facility] tomorrow. SBP low 90s on midodrine 10 tid. We had long discussion about my concern that he will not tolerate iHD long. He realizes that if iHD unsuccessful will need to move toward comfort care. Trialysis cath is out. Continue abx. Continue PT. Hold off on CIR for now.   Arvilla Meres, MD  5:24 PM

## 2017-07-29 NOTE — Progress Notes (Signed)
RN notified by telemetry that patient experienced a 28 beat run of V-tach.  Patients BP 115/60 currently v-paced at 77.  Pt. Asymptomatic throughout episode.  Patient appears to be resting comfortably with no s/s of distress noted.  Dr. Grandville Silos paged.  Will continue to follow up.

## 2017-07-29 NOTE — Care Management Note (Addendum)
Case Management Note Previous Note Completed by Irven Shelling RN, BSN Unit 4E-Case Manager-- [place] coverage [phone]  Patient Details  Name: [name] MRN: [id number] Date of Birth: [birth year]  Subjective/Objective:  Pt admitted with HF                  Action/Plan: PTA pt lived at home with wife- independent, has PCP and insurance with drug coverage-no difficulty affording medications.  per last CM note- referral was done with [facility] for emmi f/u calls. CM to follow for d/c needs  Expected Discharge Date:                  Expected Discharge Plan:     In-House Referral:     Discharge planning Services     Post Acute Care Choice:    Choice offered to:     DME Arranged:    DME Agency:     HH Arranged:    HH Agency:     Status of Service:     If discussed at [employer] of Stay Meetings, dates discussed:    Discharge Disposition:   Additional Comments: 07/29/2017  Update:  Per HF team pt will go to IHD tomorrow - if pt does not tolerate [place] discussions will begin.  CIR/SNF continuing to follow  Discussed in LOS 07/28/17 - pt remains appropriate for continued stay.  CIR following and SNF.  Pt remains volume overloaded with IHD assessed daily.  Pt has not been deemed long term HD as of yet. Maryclare Labrador, RN 07/29/2017, 9:36 AM

## 2017-07-30 DIAGNOSIS — G4733 Obstructive sleep apnea (adult) (pediatric): Secondary | ICD-10-CM

## 2017-07-30 DIAGNOSIS — I50810 Right heart failure, unspecified: Secondary | ICD-10-CM

## 2017-07-30 DIAGNOSIS — Z9989 Dependence on other enabling machines and devices: Secondary | ICD-10-CM

## 2017-07-30 LAB — HEPATITIS B SURFACE ANTIGEN: Hepatitis B Surface Ag: NEGATIVE

## 2017-07-30 LAB — CBC
HCT: 27.9 % — ABNORMAL LOW (ref 39.0–52.0)
Hemoglobin: 8.9 g/dL — ABNORMAL LOW (ref 13.0–17.0)
MCH: 29 pg (ref 26.0–34.0)
MCHC: 31.9 g/dL (ref 30.0–36.0)
MCV: 90.9 fL (ref 78.0–100.0)
PLATELETS: 116 10*3/uL — AB (ref 150–400)
RBC: 3.07 MIL/uL — ABNORMAL LOW (ref 4.22–5.81)
RDW: 17.7 % — AB (ref 11.5–15.5)
WBC: 6.4 10*3/uL (ref 4.0–10.5)

## 2017-07-30 LAB — MAGNESIUM: Magnesium: 2.2 mg/dL (ref 1.7–2.4)

## 2017-07-30 LAB — COOXEMETRY PANEL
CARBOXYHEMOGLOBIN: 1.9 % — AB (ref 0.5–1.5)
METHEMOGLOBIN: 0.9 % (ref 0.0–1.5)
O2 SAT: 72.6 %
TOTAL HEMOGLOBIN: 9.3 g/dL — AB (ref 12.0–16.0)

## 2017-07-30 LAB — HCV COMMENT:

## 2017-07-30 LAB — RENAL FUNCTION PANEL
ALBUMIN: 1.6 g/dL — AB (ref 3.5–5.0)
ANION GAP: 13 (ref 5–15)
BUN: 74 mg/dL — ABNORMAL HIGH (ref 6–20)
CALCIUM: 8.7 mg/dL — AB (ref 8.9–10.3)
CO2: 23 mmol/L (ref 22–32)
Chloride: 96 mmol/L — ABNORMAL LOW (ref 101–111)
Creatinine, Ser: 4.35 mg/dL — ABNORMAL HIGH (ref 0.61–1.24)
GFR calc non Af Amer: 13 mL/min — ABNORMAL LOW (ref >60)
GFR, EST AFRICAN AMERICAN: 15 mL/min — AB (ref >60)
Glucose, Bld: 100 mg/dL — ABNORMAL HIGH (ref 65–99)
PHOSPHORUS: 6.7 mg/dL — AB (ref 2.5–4.6)
POTASSIUM: 3.6 mmol/L (ref 3.5–5.1)
SODIUM: 132 mmol/L — AB (ref 135–145)

## 2017-07-30 LAB — GLUCOSE, CAPILLARY
GLUCOSE-CAPILLARY: 123 mg/dL — AB (ref 65–99)
GLUCOSE-CAPILLARY: 73 mg/dL (ref 65–99)
Glucose-Capillary: 93 mg/dL (ref 65–99)

## 2017-07-30 LAB — PROTIME-INR
INR: 3.32
PROTHROMBIN TIME: 34.4 s — AB (ref 11.4–15.2)

## 2017-07-30 LAB — HEPATITIS B CORE ANTIBODY, IGM: Hep B C IgM: NEGATIVE

## 2017-07-30 LAB — HEPATITIS B SURFACE ANTIBODY,QUALITATIVE: Hep B S Ab: NONREACTIVE

## 2017-07-30 LAB — HEPATITIS C ANTIBODY (REFLEX): HCV Ab: 0.2 s/co ratio (ref 0.0–0.9)

## 2017-07-30 MED ORDER — LIDOCAINE-PRILOCAINE 2.5-2.5 % EX CREA: 1.0000 "application " | TOPICAL_CREAM | CUTANEOUS | Status: DC | PRN

## 2017-07-30 MED ORDER — HEPARIN SODIUM (PORCINE) 1000 UNIT/ML DIALYSIS
100.0000 [IU]/kg | INTRAMUSCULAR | Status: DC | PRN
  Filled 2017-07-30: qty 8

## 2017-07-30 MED ORDER — SODIUM CHLORIDE 0.9 % IV SOLN: 100.0000 mL | INTRAVENOUS | Status: DC | PRN

## 2017-07-30 MED ORDER — ALTEPLASE 2 MG IJ SOLR: 2.0000 mg | Freq: Once | INTRAMUSCULAR | Status: DC | PRN

## 2017-07-30 MED ORDER — HEPARIN SODIUM (PORCINE) 1000 UNIT/ML DIALYSIS: 1000.0000 [IU] | INTRAMUSCULAR | Status: DC | PRN

## 2017-07-30 MED ORDER — PENTAFLUOROPROP-TETRAFLUOROETH EX AERO: 1.0000 "application " | INHALATION_SPRAY | CUTANEOUS | Status: DC | PRN

## 2017-07-30 MED ORDER — LIDOCAINE HCL (PF) 1 % IJ SOLN: 5.0000 mL | INTRAMUSCULAR | Status: DC | PRN

## 2017-07-30 NOTE — Progress Notes (Signed)
PROGRESS NOTE    [name]  UXN:[id number] DOB: Sep 12, 1956 DOA: 07/09/2017 PCP: Redmond School, MD    Brief Narrative:  61 y.o.male with history of ischemic cardiomyopathy status post ICD placement. Also history of chronic renal failure. Admitted on 8/4 with acute respiratory failure secondary to pulmonary edema. Subsequently found to have a MSSA bacteremia. Patient has had persistent shock requiring vasopressor support and is on renal replacement therapy. He was initially admitted to [facility] and transferred to [facility] service on 8/20.  Patient now significantly volume overloaded on high-dose oral diuretics and Zaroxolyn with worsening renal function. Patient initially received CVVHD which was subsequently discontinued. Due to worsening renal function nephrology considering starting patient back on hemodialysis 07/30/2017, if patient is able to tolerate it.   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 Afebrile. Normal white count. Repeat blood cultures from 07/11/2017 negative to date.TEE 07/19/2017 negative for endocarditis or vegetation on valves/ICD. Patient status post double-lumen central line and interventional radiology. Patient is to receive 6 weeks of IV antibiotics and then subsequently be  converted to chronic suppressive oral cephalexin per ID recommendations. Patient will follow-up with ID in the outpatient setting.   #2 acute on chronic systolic heart failure/cardiogenic shock Patient with a poor ejection fraction. 2-D echo from August 2018 with a EF of 15-20%. Patient also noted with low blood pressure with systolics now in the 25K. Patient with low blood pressure felt to be secondary to sepsis and cardiogenic shock. Patient still clinically volume overloaded. Beta blocker on hold secondary to acute decompensation. Unable to place an ACE inhibitor or ARB due to worsening renal function. Patient with a urine output of 3.050 L over the past 24 hours and patient is - 14.4 L during this hospitalization. Continue current dose of Lasix 160 mg orally 3 times daily and Zaroxolyn. Patient also for probable hemodialysis today per nephrology. Cardiology/heart failure team following and appreciate input and recommendations.  #3 acute on chronic kidney disease stage IV Patient with worsening renal function and creatinine currently at 4.35 from 4.10 from 3.83 on 07/28/2017 and rising. Patient did receive CVVHD early on in the hospitalization which has subsequently been discontinued. Patient with a urine output of 3.050 L over the past 24 hours on diuretics. Patient is a -14.4 L during this hospitalization. Blood pressure is borderline with systolics remaining in the low 100s. Continue Midrin 10 mg by mouth 3 times a day. Continue current dose of Lasix. Patient for probable hemodialysis today per nephrology. Per nephrology.  #4 cirrhosis Per CT abdomen and pelvis. patient on diuretics. Outpatient follow-up.   #5 well-controlled DM 2 Hemoglobin A1c 6.6. CBGs have ranged from 93-103. On Lantus and sliding scale insulin.  #6 history of gout No complaints. Continue colchicine.   #  7 anemia Anemia of chronic kidney disease. Patient status post 2 units packed red blood cells 07/22/2017. Hemoglobin has  remained stable. Continue aranesp per nephrology.  #8 chronic atrial fibrillation Rate is currently controlled. Continue Coumadin for anticoagulation. Per cardiology.   #9 H/O rectal CA with diverting colostomy Stable. Outpatient follow-up.  DVT prophylaxis: coumadin Code Status: full Family Communication: Updated patient and wife at bedside. Disposition Plan: CIR versus skilled nursing facility pending hospital outcome.   Consultants:   CIR: Dr. Tessa Lerner 07/26/2017   electrophysiology: Dr. Rayann Heman 07/14/2017  Cardiology/heart failure Dr.Qureshi 07/10/2017  Nephrology: Dr. Lorrene Reid 07/11/2017  Infectious disease: Dr. Eleanor Medal 07/11/2017  Procedures: TEE 8/14: EF 20% with diffuse hypokinesis. Moderate hypokinesis of RV. ICD/pacing wires noted without vegetation. Left atrium dilated without thrombus. Right atrium dilated and pacing wires without vegetation. Mild aortic insufficiency without vegetation. Mild mitral regurgitation without vegetation. Moderate to severe tricuspid regurgitation without vegetation. Pulmonic valve without vegetation. No PFO or ASD. No pericardial effusion. CT right hip 07/11/2017 CT abdomen and pelvis 07/10/2017 2-D echo 07/10/2017 Transfused 2 units packed red blood cells 07/22/2017 Right IJV tunneled PICC per Dr. Barbie Banner 07/27/2017    SIGNIFICANT EVENTS: 08/04 - Admit 08/05 - Started on CVVHD 08/15 - CVVHD stopped  8/20 - transfer to [facility] service.  8/25--HD   Antimicrobials:   IV Ancef 07/10/2017   IV vancomycin 07/10/2017>>> 07/11/2017   Subjective: Patient in bed. States breathing is good. Denies chest pain. Patient states for hemodialysis early afternoon.   Objective: Vitals:   07/29/17 2200 07/29/17 2350 07/30/17 0422 07/30/17 0700  BP:  (!) 101/56 (!) 112/59 (!) 100/56  Pulse: 74 77 73 70  Resp: 13 11 15 11   Temp:  98.4 F (36.9 C) (!) 97.4 F (36.3 C) 97.9 F (36.6 C)  TempSrc:  Oral Oral Oral  SpO2: 100% 100% 99% 98%  Weight:    78.9 kg (173 lb 15.1 oz)   Height:        Intake/Output Summary (Last 24 hours) at 07/30/17 0928 Last data filed at 07/30/17 0900  Gross per 24 hour  Intake             1070 ml  Output             3350 ml  Net            -2280 ml   Filed Weights   07/28/17 0447 07/29/17 0400 07/30/17 0422  Weight: 82.5 kg (181 lb 14.1 oz) 81.9 kg (180 lb 8 oz) 78.9 kg (173 lb 15.1 oz)    Examination:  General exam: In bed. Respiratory system: Bibasilar crackles. No wheezing.  Respiratory effort normal. Cardiovascular system: RRR with 3/6 SEM. 1-2 + BLE edema up to hips. Gastrointestinal system: abdomen is soft, nondistended,Nontender, positive bowel sounds. Ostomy pouching intact with stool. Central nervous system: Alert and oriented. No focal neurological deficits. Extremities: Bilateral lower extremity edema. Skin: Bandages on lower shins. Chronic venous stasis changes.  Psychiatry: Judgement and insight is fair. Mood & affect appropriate.     Data Reviewed: I have personally reviewed following labs and imaging studies  CBC:  Recent Labs Lab 07/26/17 0409 07/27/17 0356 07/28/17 0430 07/29/17 0456 07/30/17 0452  WBC 7.1 6.7 7.2 5.7 6.4  HGB 8.6* 8.7* 8.7* 8.5* 8.9*  HCT 26.9* 27.2* 27.6* 26.9* 27.9*  MCV 89.7 91.6 90.5 90.9 90.9  PLT 105* 105* 113* 102* 604*   Basic Metabolic Panel:  Recent Labs Lab 07/26/17 0409 07/27/17 0356 07/28/17 0430  07/29/17 0456 07/30/17 0452 07/30/17 0740  NA 128* 131* 129* 130*  --  132*  K 3.5 3.4* 4.3 3.8  --  3.6  CL 97* 98* 96* 96*  --  96*  CO2 21* 23 22 22   --  23  GLUCOSE 92 104* 137* 110*  --  100*  BUN 53* 55* 61* 70*  --  74*  CREATININE 3.21* 3.44* 3.83* 4.10*  --  4.35*  CALCIUM 8.1* 8.4* 8.6* 8.8*  --  8.7*  MG 1.9 1.9 1.9 2.3 2.2  --   PHOS 5.2* 6.0* 6.3* 6.3*  --  6.7*   GFR: Estimated Creatinine Clearance: 18.2 mL/min (A) (by C-G formula based on SCr of 4.35 mg/dL (H)). Liver Function Tests:  Recent Labs Lab  07/26/17 0930 07/27/17 0356 07/28/17 0430 07/29/17 0456 07/30/17 0740  AST 22  --   --   --   --   ALT <5*  --   --   --   --   ALKPHOS 102  --   --   --   --   BILITOT 1.2  --   --   --   --   PROT 6.6  --   --   --   --   ALBUMIN 1.8* 1.7* 1.8* 1.6* 1.6*   No results for input(s): LIPASE, AMYLASE in the last 168 hours. No results for input(s): AMMONIA in the last 168 hours. Coagulation Profile:  Recent Labs Lab 07/26/17 0409 07/27/17 0356 07/28/17 0430 07/29/17 0456 07/30/17 0452  INR 2.17 2.75 3.35 3.26 3.32   Cardiac Enzymes: No results for input(s): CKTOTAL, CKMB, CKMBINDEX, TROPONINI in the last 168 hours. BNP (last 3 results) No results for input(s): PROBNP in the last 8760 hours. HbA1C:  Recent Labs  07/28/17 0500  HGBA1C 6.6*   CBG:  Recent Labs Lab 07/29/17 0825 07/29/17 1202 07/29/17 1559 07/29/17 2133 07/30/17 0743  GLUCAP 144* 123* 89 103* 93   Lipid Profile: No results for input(s): CHOL, HDL, LDLCALC, TRIG, CHOLHDL, LDLDIRECT in the last 72 hours. Thyroid Function Tests: No results for input(s): TSH, T4TOTAL, FREET4, T3FREE, THYROIDAB in the last 72 hours. Anemia Panel: No results for input(s): VITAMINB12, FOLATE, FERRITIN, TIBC, IRON, RETICCTPCT in the last 72 hours. Sepsis Labs: No results for input(s): PROCALCITON, LATICACIDVEN in the last 168 hours.  No results found for this or any previous visit (from the past 240 hour(s)).       Radiology Studies: No results found.      Scheduled Meds: . colchicine  0.3 mg Oral Daily  . darbepoetin (ARANESP) injection - NON-DIALYSIS  200 mcg Subcutaneous Q Wed-1800  . furosemide  160 mg Oral TID  . insulin aspart  0-5 Units Subcutaneous QHS  . insulin aspart  0-9 Units Subcutaneous TID WC  . insulin aspart  3 Units Subcutaneous TID WC  . insulin glargine  10 Units Subcutaneous QHS  . metolazone  10 mg Oral Daily  . midodrine  10 mg Oral TID WC  . sodium chloride flush  10-40 mL  Intracatheter Q12H  . Warfarin - Pharmacist Dosing Inpatient   Does not apply q1800   Continuous Infusions: . sodium chloride 250 mL (07/30/17 0400)  .  ceFAZolin (ANCEF) IV Stopped (07/29/17 1824)     LOS: 20 days    Time spent: 21 minutes    THOMPSON,DANIEL, MD [facility] Pager [phone]  If 7PM-7AM, please contact night-coverage [email or web address] Password [facility] 07/30/2017, 9:28 AM

## 2017-07-30 NOTE — Progress Notes (Signed)
Patient ID: [name], male   DOB: 1956/02/04, 61 y.o.   MRN: [id number]     Advanced Heart Failure Rounding Note  Primary Cardiologist: Apostolos Blagg  Subjective:    Admitted with staph bacteremia with mixed septic/cardiogenic shock.   TEE 07/19/17 with LVEF 20%. No evidence of endocarditis or vegetation on valves/ICD  CVVD stopped 07/20/17.   Says he feels pretty good today. Scheduled to try iHD today. He is worried about what will happen if he can't tolerate it.   Denies dyspnea. Working with PT.   Objective:   Weight Range: 78.9 kg (173 lb 15.1 oz) Body mass index is 24.96 kg/m.   Vital Signs:   Temp:  [97.4 F (36.3 C)-98.4 F (36.9 C)] 97.9 F (36.6 C) (08/25 1228) Pulse Rate:  [70-77] 72 (08/25 1228) Resp:  [10-15] 12 (08/25 1228) BP: (94-117)/(55-62) 96/55 (08/25 1228) SpO2:  [96 %-100 %] 96 % (08/25 1228) Weight:  [78.9 kg (173 lb 15.1 oz)] 78.9 kg (173 lb 15.1 oz) (08/25 0422) Last BM Date: 07/28/17  Weight change: Filed Weights   07/28/17 0447 07/29/17 0400 07/30/17 0422  Weight: 82.5 kg (181 lb 14.1 oz) 81.9 kg (180 lb 8 oz) 78.9 kg (173 lb 15.1 oz)    Intake/Output:   Intake/Output Summary (Last 24 hours) at 07/30/17 1429 Last data filed at 07/30/17 1300  Gross per 24 hour  Intake              380 ml  Output             2750 ml  Net            -2370 ml      Physical Exam   General:  Weak appearing. No resp difficulty HEENT: normal Neck: supple JVP 10  Carotids 2+ bilat;  No lymphadenopathy or thryomegaly appreciated. Cor: RRR 2/6 AS and TR Lungs: clear Abdomen: soft, nontender, nondistended. +colostomy bag  No hepatosplenomegaly. No bruits or masses. Good bowel sounds. Extremities: no cyanosis, clubbing, rash, 2+ thigh edema + wounds Neuro: alert & orientedx3, cranial nerves grossly intact. moves all 4 extremities w/o difficulty. Affect pleasan    Telemetry   BiV pacing 70s underlying rhythm a fib.  Personally reviewed   Labs     CBC  Recent Labs  07/29/17 0456 07/30/17 0452  WBC 5.7 6.4  HGB 8.5* 8.9*  HCT 26.9* 27.9*  MCV 90.9 90.9  PLT 102* 116*   Basic Metabolic Panel  Recent Labs  07/29/17 0456 07/30/17 0452 07/30/17 0740  NA 130*  --  132*  K 3.8  --  3.6  CL 96*  --  96*  CO2 22  --  23  GLUCOSE 110*  --  100*  BUN 70*  --  74*  CREATININE 4.10*  --  4.35*  CALCIUM 8.8*  --  8.7*  MG 2.3 2.2  --   PHOS 6.3*  --  6.7*   Liver Function Tests  Recent Labs  07/29/17 0456 07/30/17 0740  ALBUMIN 1.6* 1.6*   No results for input(s): LIPASE, AMYLASE in the last 72 hours. Cardiac Enzymes No results for input(s): CKTOTAL, CKMB, CKMBINDEX, TROPONINI in the last 72 hours.  BNP: BNP (last 3 results)  Recent Labs  06/13/17 1205 07/09/17 2151  BNP 1,151.0* 1,553.9*    ProBNP (last 3 results) No results for input(s): PROBNP in the last 8760 hours.   D-Dimer No results for input(s): DDIMER in the last 72 hours. Hemoglobin A1C  Recent Labs  07/28/17 0500  HGBA1C 6.6*   Fasting Lipid Panel No results for input(s): CHOL, HDL, LDLCALC, TRIG, CHOLHDL, LDLDIRECT in the last 72 hours. Thyroid Function Tests No results for input(s): TSH, T4TOTAL, T3FREE, THYROIDAB in the last 72 hours.  Invalid input(s): FREET3  Other results:   Imaging   No results found.  Medications:     Scheduled Medications: . colchicine  0.3 mg Oral Daily  . darbepoetin (ARANESP) injection - NON-DIALYSIS  200 mcg Subcutaneous Q Wed-1800  . furosemide  160 mg Oral TID  . insulin aspart  0-5 Units Subcutaneous QHS  . insulin aspart  0-9 Units Subcutaneous TID WC  . insulin aspart  3 Units Subcutaneous TID WC  . insulin glargine  10 Units Subcutaneous QHS  . metolazone  10 mg Oral Daily  . midodrine  10 mg Oral TID WC  . sodium chloride flush  10-40 mL Intracatheter Q12H  . Warfarin - Pharmacist Dosing Inpatient   Does not apply q1800    Infusions: . sodium chloride 250 mL (07/30/17 0400)  .   ceFAZolin (ANCEF) IV Stopped (07/29/17 1824)    PRN Medications: sodium chloride, acetaminophen, alum & mag hydroxide-simeth, fentaNYL (SUBLIMAZE) injection, HYDROcodone-acetaminophen, lidocaine (PF), magic mouthwash w/lidocaine, ondansetron (ZOFRAN) IV, sodium chloride flush    Patient Profile   [name] is a 61 year old with a history of DMII, SAH 2016, rectal cancer, CKD Stage IV, chronic A fib, S/P AVN ablation, PAF, CAD BMS LAD 2011 BMS LAD 2009, OSA, chronic systolic heart failure, St Jude BiV ICD admitted with AKI and shock with elevated lactate, suspect cardiogenic.   Assessment/Plan   1. Acute on chronic systolic CHF/cardiogenic shock: Last echo in 8/17 with EF 15-20%.  Ischemic cardiomyopathy, has St Jude BiV ICD (despite chronic afib appears to be effectively BiV pacing).  Had recent admission (discharged 06/21/17) requiring milrinone support and diuresis.  Admitted with soft BP (SBP 80s-90s) + elevated lactate.  Suspect mixed septic/cardiogenic shock with MSSA bacteremia.  - CVVHD stopped 07/20/17.  -  Volume status improving on high-dose oral lasix but creatinine worsening. Plan trial of iHD today.  -  No beta blocker with decompensation, hypotension.  - No ARB with AKI.   2. AKI on CKD stage 4:  - Volume status improving on high-dose oral lasix but creatinine worsening. Plan trial of iHD today.  -Continue midodrine.   3. Atrial fibrillation: Chronic - Continue coumadin. No plans to extract device.  - INR 3.32 Discussed with HF Pharm D.   4. CAD:  - No ischemia s/s  - Continue ASA. No statin with recent elevated LFT's    5. ID:  - Blood CX 2/2 MSSA. Antibiotics narrowed, now on Ancef. 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   - Not planning ICD extraction for now due to severe comorbidities. He is device dependent.  - TEE 07/19/17 with no evidence of endocarditis.  -Per ID continue cefazolin until 9/15 the start renally-dosed cephalexin.  -PICC placed.     6. H/O Rectal CA with diverting colostomy   7. Type II diabetes:  - Continue sliding scale.   8. Liver failure:  LFTs elevated on admit .   9. Anemia of chronic disease. - s/p RBCs on 8/17.  - Todays hgb 8.9.   10. Deconditioning-- PT/OT recommending CIR. CIR following but will need to hold off until we see if he tolerates iHD  Long talk with hi and his wife today. About plan  and prognosis. We will see how he tolerates iHD. If he doesn't tolerate HD will need Palliative Care for GOC and likely Hospice.   Total time spent 35 minutes. Over half that time spent discussing above.   Length of Stay: 20  Arvilla Meres, MD  07/30/2017, 2:29 PM  Advanced Heart Failure Team Pager [phone] (M-F; 7a - 4p)  Please contact [facility] for night-coverage after hours (4p -7a ) and weekends on [email or web address]

## 2017-07-30 NOTE — Progress Notes (Signed)
ANTICOAGULATION CONSULT NOTE - Follow Up Consult  Pharmacy Consult for Warfarin Indication: atrial fibrillation  No Known Allergies  Patient Measurements: Height: 5\' 10"  (177.8 cm) Weight: 173 lb 15.1 oz (78.9 kg) IBW/kg (Calculated) : 73  Vital Signs: Temp: 97.9 F (36.6 C) (08/25 0700) Temp Source: Oral (08/25 0700) BP: 100/56 (08/25 0700) Pulse Rate: 70 (08/25 0700)  Labs:  Recent Labs  07/28/17 0430 07/29/17 0456 07/30/17 0452 07/30/17 0740  HGB 8.7* 8.5* 8.9*  --   HCT 27.6* 26.9* 27.9*  --   PLT 113* 102* 116*  --   LABPROT 34.7* 34.0* 34.4*  --   INR 3.35 3.26 3.32  --   CREATININE 3.83* 4.10*  --  4.35*    Estimated Creatinine Clearance: 18.2 mL/min (A) (by C-G formula based on SCr of 4.35 mg/dL (H)).   Assessment: 61 yo male with Afib, Coumadin restarted 8/19.  No bleeding or complications noted.  CBC stable.  INR 3.3 still above goal today despite holding Coumadin dose x2  Goal of Therapy:  INR 2-3 Monitor platelets by anticoagulation protocol: Yes   Plan:  No Coumadin again tonight.   Daily INR.   Bonnita Nasuti Pharm.D. CPP, BCPS Clinical Pharmacist [phone] 07/30/2017 11:35 AM

## 2017-07-30 NOTE — Progress Notes (Signed)
Patient refuses cpap 

## 2017-07-30 NOTE — Progress Notes (Signed)
Subjective:3 Interval History: has no complaint, slept well.  Objective: Vital signs in last 24 hours: Temp:  [97.4 F (36.3 C)-98.4 F (36.9 C)] 97.4 F (36.3 C) (08/25 0422) Pulse Rate:  [73-77] 73 (08/25 0422) Resp:  [10-15] 15 (08/25 0422) BP: (90-117)/(56-62) 112/59 (08/25 0422) SpO2:  [97 %-100 %] 99 % (08/25 0422) Weight:  [78.9 kg (173 lb 15.1 oz)] 78.9 kg (173 lb 15.1 oz) (08/25 0422) Weight change: -2.974 kg (-6 lb 8.9 oz)  Intake/Output from previous day: 08/24 0701 - 08/25 0700 In: 1070 [I.V.:970; IV Piggyback:100] Out: 3050 [Urine:3050] Intake/Output this shift: No intake/output data recorded.  General appearance: alert, cooperative, cachectic and no distress Resp: rales bibasilar Chest wall: Pacer L [place] Cardio: regular rate and rhythm and systolic murmur: holosystolic 2/6, blowing at apex, below, EPIC error GI: at apex, Paced Extremities: edema 3+, AVF RUA  GI distended, pos bs, soft, ostomy mid abdm  Lab Results:  Recent Labs  07/29/17 0456 07/30/17 0452  WBC 5.7 6.4  HGB 8.5* 8.9*  HCT 26.9* 27.9*  PLT 102* 116*   BMET:  Recent Labs  07/28/17 0430 07/29/17 0456  NA 129* 130*  K 4.3 3.8  CL 96* 96*  CO2 22 22  GLUCOSE 137* 110*  BUN 61* 70*  CREATININE 3.83* 4.10*  CALCIUM 8.6* 8.8*   No results for input(s): PTH in the last 72 hours. Iron Studies: No results for input(s): IRON, TIBC, TRANSFERRIN, FERRITIN in the last 72 hours.  Studies/Results: No results found.  I have reviewed the patient's current medications.  Assessment/Plan: 1 CKD 4-5/AKI if Cr ^ , will do HD, chem not back.  Volxs, mild acidemia.  If Cr, ^, HD 2 CM  3 MSSA sepsis Ancef 4 Pacer 5 Cirrhosis 6 DM controlled 7anemia 8 Hx colon Ca P check chem, cont Lasix, prob HD    LOS: 20 days   [name] L 07/30/2017,7:17 AM

## 2017-07-31 DIAGNOSIS — I5023 Acute on chronic systolic (congestive) heart failure: Secondary | ICD-10-CM

## 2017-07-31 LAB — RENAL FUNCTION PANEL
Albumin: 1.8 g/dL — ABNORMAL LOW (ref 3.5–5.0)
Anion gap: 13 (ref 5–15)
BUN: 54 mg/dL — AB (ref 6–20)
CALCIUM: 8.2 mg/dL — AB (ref 8.9–10.3)
CO2: 24 mmol/L (ref 22–32)
CREATININE: 3.65 mg/dL — AB (ref 0.61–1.24)
Chloride: 96 mmol/L — ABNORMAL LOW (ref 101–111)
GFR, EST AFRICAN AMERICAN: 19 mL/min — AB (ref >60)
GFR, EST NON AFRICAN AMERICAN: 16 mL/min — AB (ref >60)
Glucose, Bld: 75 mg/dL (ref 65–99)
PHOSPHORUS: 5.5 mg/dL — AB (ref 2.5–4.6)
Potassium: 3.5 mmol/L (ref 3.5–5.1)
Sodium: 133 mmol/L — ABNORMAL LOW (ref 135–145)

## 2017-07-31 LAB — CBC
HEMATOCRIT: 28.3 % — AB (ref 39.0–52.0)
HEMOGLOBIN: 9 g/dL — AB (ref 13.0–17.0)
MCH: 29.9 pg (ref 26.0–34.0)
MCHC: 31.8 g/dL (ref 30.0–36.0)
MCV: 94 fL (ref 78.0–100.0)
Platelets: 120 10*3/uL — ABNORMAL LOW (ref 150–400)
RBC: 3.01 MIL/uL — AB (ref 4.22–5.81)
RDW: 18.7 % — ABNORMAL HIGH (ref 11.5–15.5)
WBC: 6.3 10*3/uL (ref 4.0–10.5)

## 2017-07-31 LAB — PROTIME-INR
INR: 2.88
Prothrombin Time: 30.8 seconds — ABNORMAL HIGH (ref 11.4–15.2)

## 2017-07-31 LAB — GLUCOSE, CAPILLARY
Glucose-Capillary: 89 mg/dL (ref 65–99)
Glucose-Capillary: 126 mg/dL — ABNORMAL HIGH (ref 65–99)
Glucose-Capillary: 168 mg/dL — ABNORMAL HIGH (ref 65–99)
Glucose-Capillary: 142 mg/dL — ABNORMAL HIGH (ref 65–99)

## 2017-07-31 LAB — MAGNESIUM: MAGNESIUM: 2 mg/dL (ref 1.7–2.4)

## 2017-07-31 LAB — COOXEMETRY PANEL
Carboxyhemoglobin: 2.1 % — ABNORMAL HIGH (ref 0.5–1.5)
Methemoglobin: 1.3 % (ref 0.0–1.5)
O2 Saturation: 61.7 %
Total hemoglobin: 9.2 g/dL — ABNORMAL LOW (ref 12.0–16.0)

## 2017-07-31 MED ORDER — WARFARIN SODIUM 2 MG PO TABS
2.0000 mg | ORAL_TABLET | Freq: Once | ORAL | Status: AC
  Administered 2017-07-31: 2 mg via ORAL
  Filled 2017-07-31: qty 1

## 2017-07-31 NOTE — Progress Notes (Signed)
PROGRESS NOTE    [name]  ONG:[id number] DOB: 10/01/1956 DOA: 07/09/2017 PCP: Redmond School, MD    Brief Narrative:  61 y.o.male with history of ischemic cardiomyopathy status post ICD placement. Also history of chronic renal failure. Admitted on 8/4 with acute respiratory failure secondary to pulmonary edema. Subsequently found to have a MSSA bacteremia. Patient has had persistent shock requiring vasopressor support and is on renal replacement therapy. He was initially admitted to [facility] and transferred to [facility] service on 8/20.  Patient now significantly volume overloaded on high-dose oral diuretics and Zaroxolyn with worsening renal function. Patient initially received CVVHD which was subsequently discontinued. Due to worsening renal function patient underwent hemodialysis 07/30/2017 which she tolerated per nephrology. Patient for another hemodialysis session 08/01/2017. Patient is to continue with diuretics as well. Nephrology and heart failure team following.    Assessment & Plan:   Principal Problem:   Staphylococcus aureus bacteremia with sepsis [name]) Active Problems:   Ischemic cardiomyopathy-EF 35% 04/24/15 echo   Long term current use of anticoagulant therapy   CAD with LCX disease and stents to LAD   Chronic a-fib (HCC)   Chronic kidney disease, stage IV (severe) (HCC)   OSA on CPAP   Hyperlipidemia   BiV ICD (St Jude) gen change 04/10/15   Chronic atrial fibrillation (HCC)   Right heart failure (HCC)   Septic shock (HCC)   Cardiorenal syndrome with renal failure   Infected defibrillator (HCC)   Septic hip (HCC)   Severe sepsis with septic shock ([name])   Bacteremia due to methicillin susceptible Staphylococcus aureus (MSSA)   Acute on chronic systolic CHF (congestive heart failure) ([name])  #1 MSSA bacteremia/sepsis Patient improving clinically. Afebrile. No leukocytosis. Repeat blood cultures from 07/11/2017 negative to date.TEE 07/19/2017 negative for endocarditis or  vegetation on valves/ICD. Patient status post double-lumen central line and interventional radiology. Patient is to receive 6 weeks of IV antibiotics and then subsequently be converted to chronic suppressive oral cephalexin per ID recommendations. Patient will follow-up with ID in the outpatient setting.   #2 acute on chronic systolic heart failure/cardiogenic shock Some clinical improvement after hemodialysis yesterday. Patient with a poor ejection fraction. 2-D echo from August 2018 with a EF of 15-20%. Patient also noted with low blood pressure with systolics now in the 44W. Patient with low blood pressure felt to be secondary to sepsis and cardiogenic shock. Patient still clinically volume overloaded. Beta blocker on hold secondary to acute decompensation. Unable to place an ACE inhibitor or ARB due to worsening renal function. Patient with a urine output of 1.850 L over the past 24 hours and patient is - 15.873 L during this hospitalization. Continue current dose of Lasix 160 mg orally 3 times daily and Zaroxolyn nad HD. Patient for hemodialysis tomorrow 08/01/2017 per nephrology. Cardiology/heart failure team following and appreciate input and recommendations.  #3 acute on chronic kidney disease stage IV Patient with worsening renal function and creatinine despite diuresis and creatinine was 4.35 on 07/30/2017. Patient underwent hemodialysis yesterday 07/30/2017 which he tolerated and per nephrology did well. Creatinine currently at 3.65 today from 4.35 from 4.10 from 3.83 on 07/28/2017.  Patient did receive CVVHD early on in the hospitalization which has subsequently been discontinued. Patient with a urine output of 1.850 L over the past 24 hours on diuretics. 2.5 L of fluid removed during hemodialysis on 07/30/2017. Patient is a - 15.873 L during this hospitalization. Blood pressure is low-borderline with systolics remaining in the low 90s. Continue Midrin  10 mg by mouth 3 times a day. Continue  current dose of Lasix. Patient for hemodialysis  tomorrow 08/01/2017 per nephrology. Per nephrology.  #4 cirrhosis Per CT abdomen and pelvis. Continue with diuretics. Outpatient follow-up.   #5 well-controlled DM 2 Hemoglobin A1c 6.6. CBGs have ranged from 73-89. Continue current dose of Lantus and sliding scale insulin.  #6 history of gout No complaints. Continue colchicine.   #7 anemia Anemia of chronic kidney disease. Hemoglobin currently stable. Status post 2 units packed red blood cells 07/22/2017. Aranesp per nephrology.   #8 chronic atrial fibrillation Rate is currently controlled. INR is 2.88. Continue Coumadin for anticoagulation. Per cardiology.   #9 H/O rectal CA with diverting colostomy Stable. Outpatient follow-up.  DVT prophylaxis: coumadin Code Status: full Family Communication: Updated patient. No family at bedside.  Disposition Plan: CIR versus skilled nursing facility pending [name] outcome.   Consultants:   CIR: Dr. Tessa Lerner 07/26/2017   electrophysiology: Dr. Rayann Heman 07/14/2017  Cardiology/heart failure Dr.Qureshi 07/10/2017  Nephrology: Dr. Lorrene Reid 07/11/2017  Infectious disease: Dr. Rashed Medal 07/11/2017  Procedures: TEE 8/14: EF 20% with diffuse hypokinesis. Moderate hypokinesis of RV. ICD/pacing wires noted without vegetation. Left atrium dilated without thrombus. Right atrium dilated and pacing wires without vegetation. Mild aortic insufficiency without vegetation. Mild mitral regurgitation without vegetation. Moderate to severe tricuspid regurgitation without vegetation. Pulmonic valve without vegetation. No PFO or ASD. No pericardial effusion. CT right hip 07/11/2017 CT abdomen and pelvis 07/10/2017 2-D echo 07/10/2017 Transfused 2 units packed red blood cells 07/22/2017 Right IJV tunneled PICC per Dr. Barbie Banner 07/27/2017    SIGNIFICANT EVENTS: 08/04 - Admit 08/05 - Started on CVVHD 08/15 - CVVHD stopped  8/20 - transfer to [facility] service.    8/25--HD   Antimicrobials:   IV Ancef 07/10/2017   IV vancomycin 07/10/2017>>> 07/11/2017   Subjective: Patient denies any shortness of breath. No chest pain. Patient states had a blood pressure in the 80s however denied any dizziness, lightheadedness or syncope. Patient states nephrologist said he did well in hemodialysis.   Objective: Vitals:   07/30/17 2343 07/31/17 0403 07/31/17 0406 07/31/17 0715  BP: (!) 93/53  (!) 90/53 (!) 88/48  Pulse: 77  75 73  Resp: 18  18 16   Temp: 97.7 F (36.5 C)  98 F (36.7 C) (!) 97 F (36.1 C)  TempSrc: Oral  Oral Oral  SpO2: 96%  95% 99%  Weight:  79.1 kg (174 lb 6.1 oz)    Height:        Intake/Output Summary (Last 24 hours) at 07/31/17 1000 Last data filed at 07/31/17 0400  Gross per 24 hour  Intake              190 ml  Output             3750 ml  Net            -3560 ml   Filed Weights   07/30/17 1440 07/30/17 1748 07/31/17 0403  Weight: 69.7 kg (153 lb 10.6 oz) 67.9 kg (149 lb 11.1 oz) 79.1 kg (174 lb 6.1 oz)    Examination:  General exam: In bed. Respiratory system: CTAB. No wheezing.  Respiratory effort normal. Cardiovascular system: RRR with 3/6 SEM. 1 + BLE edema. Gastrointestinal system: abdomen is soft, nondistended,Nontender, positive bowel sounds. Ostomy pouching intact and empty. Central nervous system: Alert and oriented. No focal neurological deficits. Extremities: 1 + Bilateral lower extremity edema. Skin: Bandages on lower shins. Chronic venous stasis changes.  Psychiatry:  Judgement and insight is fair. Mood & affect appropriate.     Data Reviewed: I have personally reviewed following labs and imaging studies  CBC:  Recent Labs Lab 07/27/17 0356 07/28/17 0430 07/29/17 0456 07/30/17 0452 07/31/17 0500  WBC 6.7 7.2 5.7 6.4 6.3  HGB 8.7* 8.7* 8.5* 8.9* 9.0*  HCT 27.2* 27.6* 26.9* 27.9* 28.3*  MCV 91.6 90.5 90.9 90.9 94.0  PLT 105* 113* 102* 116* 627*   Basic Metabolic Panel:  Recent  Labs Lab 07/27/17 0356 07/28/17 0430 07/29/17 0456 07/30/17 0452 07/30/17 0740 07/31/17 0500  NA 131* 129* 130*  --  132* 133*  K 3.4* 4.3 3.8  --  3.6 3.5  CL 98* 96* 96*  --  96* 96*  CO2 23 22 22   --  23 24  GLUCOSE 104* 137* 110*  --  100* 75  BUN 55* 61* 70*  --  74* 54*  CREATININE 3.44* 3.83* 4.10*  --  4.35* 3.65*  CALCIUM 8.4* 8.6* 8.8*  --  8.7* 8.2*  MG 1.9 1.9 2.3 2.2  --  2.0  PHOS 6.0* 6.3* 6.3*  --  6.7* 5.5*   GFR: Estimated Creatinine Clearance: 21.7 mL/min (A) (by C-G formula based on SCr of 3.65 mg/dL (H)). Liver Function Tests:  Recent Labs Lab 07/26/17 0930 07/27/17 0356 07/28/17 0430 07/29/17 0456 07/30/17 0740 07/31/17 0500  AST 22  --   --   --   --   --   ALT <5*  --   --   --   --   --   ALKPHOS 102  --   --   --   --   --   BILITOT 1.2  --   --   --   --   --   PROT 6.6  --   --   --   --   --   ALBUMIN 1.8* 1.7* 1.8* 1.6* 1.6* 1.8*   No results for input(s): LIPASE, AMYLASE in the last 168 hours. No results for input(s): AMMONIA in the last 168 hours. Coagulation Profile:  Recent Labs Lab 07/27/17 0356 07/28/17 0430 07/29/17 0456 07/30/17 0452 07/31/17 0500  INR 2.75 3.35 3.26 3.32 2.88   Cardiac Enzymes: No results for input(s): CKTOTAL, CKMB, CKMBINDEX, TROPONINI in the last 168 hours. BNP (last 3 results) No results for input(s): PROBNP in the last 8760 hours. HbA1C: No results for input(s): HGBA1C in the last 72 hours. CBG:  Recent Labs Lab 07/29/17 2133 07/30/17 0743 07/30/17 1234 07/30/17 2134 07/31/17 0715  GLUCAP 103* 93 123* 73 89   Lipid Profile: No results for input(s): CHOL, HDL, LDLCALC, TRIG, CHOLHDL, LDLDIRECT in the last 72 hours. Thyroid Function Tests: No results for input(s): TSH, T4TOTAL, FREET4, T3FREE, THYROIDAB in the last 72 hours. Anemia Panel: No results for input(s): VITAMINB12, FOLATE, FERRITIN, TIBC, IRON, RETICCTPCT in the last 72 hours. Sepsis Labs: No results for input(s):  PROCALCITON, LATICACIDVEN in the last 168 hours.  No results found for this or any previous visit (from the past 240 hour(s)).       Radiology Studies: No results found.      Scheduled Meds: . colchicine  0.3 mg Oral Daily  . darbepoetin (ARANESP) injection - NON-DIALYSIS  200 mcg Subcutaneous Q Wed-1800  . furosemide  160 mg Oral TID  . insulin aspart  0-5 Units Subcutaneous QHS  . insulin aspart  0-9 Units Subcutaneous TID WC  . insulin aspart  3 Units Subcutaneous TID WC  .  insulin glargine  10 Units Subcutaneous QHS  . metolazone  10 mg Oral Daily  . midodrine  10 mg Oral TID WC  . sodium chloride flush  10-40 mL Intracatheter Q12H  . Warfarin - Pharmacist Dosing Inpatient   Does not apply q1800   Continuous Infusions: . sodium chloride 250 mL (07/30/17 0400)  .  ceFAZolin (ANCEF) IV 2 g (07/30/17 1723)     LOS: 21 days    Time spent: 35 minutes    THOMPSON,DANIEL, MD [facility] Pager [phone]  If 7PM-7AM, please contact night-coverage [email or web address] Password [email or web address] 07/31/2017, 10:00 AM

## 2017-07-31 NOTE — Progress Notes (Signed)
Subjective: Interval History: has no complaint. Did ok on HD, up some in room.  Objective: Vital signs in last 24 hours: Temp:  [97.4 F (36.3 C)-98 F (36.7 C)] 98 F (36.7 C) (08/26 0406) Pulse Rate:  [70-96] 75 (08/26 0406) Resp:  [12-19] 18 (08/26 0406) BP: (83-98)/(46-56) 90/53 (08/26 0406) SpO2:  [95 %-100 %] 95 % (08/26 0406) Weight:  [67.9 kg (149 lb 11.1 oz)-79.1 kg (174 lb 6.1 oz)] 79.1 kg (174 lb 6.1 oz) (08/26 0403) Weight change: -9.2 kg (-20 lb 4.5 oz)  Intake/Output from previous day: 08/25 0701 - 08/26 0700 In: 190 [I.V.:90; IV Piggyback:100] Out: 7001 [Urine:1850] Intake/Output this shift: No intake/output data recorded.  General appearance: alert, cooperative, no distress and pale Resp: rales bibasilar Cardio: S1, S2 normal, systolic murmur: holosystolic 2/6, blowing at apex and Pacer L [place] GI: pos bs, liver down 5 cm, soft Extremities: edema 3+-4+ and AVF RUA, abrasions on shins  Lab Results:  Recent Labs  07/30/17 0452 07/31/17 0500  WBC 6.4 6.3  HGB 8.9* 9.0*  HCT 27.9* 28.3*  PLT 116* 120*   BMET:  Recent Labs  07/30/17 0740 07/31/17 0500  NA 132* 133*  K 3.6 3.5  CL 96* 96*  CO2 23 24  GLUCOSE 100* 75  BUN 74* 54*  CREATININE 4.35* 3.65*  CALCIUM 8.7* 8.2*   No results for input(s): PTH in the last 72 hours. Iron Studies: No results for input(s): IRON, TIBC, TRANSFERRIN, FERRITIN in the last 72 hours.  Studies/Results: No results found.  I have reviewed the patient's current medications.  Assessment/Plan: 1 CKD4-5/AKI/Now ESRD HD went well yest. tol vol off and solute lower. Plan in am. Still up about 20 L, will also cont Lasix 2 Anemia esa/Fe 3 HPTH vit D 4 CM severe 5 Cirrhosis 6 MSSA sepsis Ancef 7 Pacer 8 Debill P HD, esa, mobilize, Ancef    LOS: 21 days   [name] L 07/31/2017,7:16 AM

## 2017-07-31 NOTE — Progress Notes (Signed)
Per RN, no need to set up new CVP pressure monitoring system.

## 2017-07-31 NOTE — Progress Notes (Signed)
ANTICOAGULATION CONSULT NOTE - Follow Up Consult  Pharmacy Consult for Warfarin Indication: atrial fibrillation  No Known Allergies  Patient Measurements: Height: 5\' 10"  (177.8 cm) Weight: 174 lb 6.1 oz (79.1 kg) (the bed was not zeroed bc pt cannot get up) IBW/kg (Calculated) : 73  Vital Signs: Temp: 97.1 F (36.2 C) (08/26 1200) Temp Source: Axillary (08/26 1200) BP: 96/58 (08/26 1200) Pulse Rate: 73 (08/26 0715)  Labs:  Recent Labs  07/29/17 0456 07/30/17 0452 07/30/17 0740 07/31/17 0500  HGB 8.5* 8.9*  --  9.0*  HCT 26.9* 27.9*  --  28.3*  PLT 102* 116*  --  120*  LABPROT 34.0* 34.4*  --  30.8*  INR 3.26 3.32  --  2.88  CREATININE 4.10*  --  4.35* 3.65*    Estimated Creatinine Clearance: 21.7 mL/min (A) (by C-G formula based on SCr of 3.65 mg/dL (H)).   Assessment: 61 yo male with Afib, Coumadin restarted 8/19.  No bleeding or complications noted.  CBC stable.  INR 3.3> 2.8 after holding 2 doses - suspect may continue to fall some.   Goal of Therapy:  INR 2-3 Monitor platelets by anticoagulation protocol: Yes   Plan:  Coumadin 2mg  x1 Daily INR.   Bonnita Nasuti Pharm.D. CPP, BCPS Clinical Pharmacist [phone] 07/31/2017 1:42 PM

## 2017-07-31 NOTE — Progress Notes (Signed)
Patient ID: [name], male   DOB: 07-03-1956, 61 y.o.   MRN: [id number]     Advanced Heart Failure Rounding Note  Primary Cardiologist: Ramel Tobon  Subjective:    Admitted with staph bacteremia with mixed septic/cardiogenic shock.   TEE 07/19/17 with LVEF 20%. No evidence of endocarditis or vegetation on valves/ICD  CVVD stopped 07/20/17.   iHD started 8/25. Tolerated well.   Says he feels good today. Denies SOB. Weight 174 (baseline 175). BP 85-90 on midodrine 10tid   Denies dyspnea. Working with PT.   Objective:   Weight Range: 79.1 kg (174 lb 6.1 oz) Body mass index is 25.02 kg/m.   Vital Signs:   Temp:  [97 F (36.1 C)-98 F (36.7 C)] 97.1 F (36.2 C) (08/26 1200) Pulse Rate:  [70-96] 73 (08/26 0715) Resp:  [12-19] 16 (08/26 0715) BP: (83-98)/(46-58) 96/58 (08/26 1200) SpO2:  [95 %-100 %] 99 % (08/26 0715) Weight:  [67.9 kg (149 lb 11.1 oz)-79.1 kg (174 lb 6.1 oz)] 79.1 kg (174 lb 6.1 oz) (08/26 0403) Last BM Date: 07/28/17  Weight change: Filed Weights   07/30/17 1440 07/30/17 1748 07/31/17 0403  Weight: 69.7 kg (153 lb 10.6 oz) 67.9 kg (149 lb 11.1 oz) 79.1 kg (174 lb 6.1 oz)    Intake/Output:   Intake/Output Summary (Last 24 hours) at 07/31/17 1323 Last data filed at 07/31/17 0400  Gross per 24 hour  Intake              100 ml  Output             3750 ml  Net            -3650 ml      Physical Exam   General:  Lying in bed. Weak appearing. No resp difficulty HEENT: normal Neck: supple. JVP jaw  Carotids 2+ bilat; no bruits. No lymphadenopathy or thryomegaly appreciated. Cor: PMI nondisplaced. RRR 2/6 AS 2/6 TR Lungs: clear Abdomen: soft, nontender, nondistended.  +colostomy No hepatosplenomegaly. No bruits or masses. Good bowel sounds. Extremities: no cyanosis, clubbing, rash, 1+ edema. + wounds Neuro: alert & orientedx3, cranial nerves grossly intact. moves all 4 extremities w/o difficulty. Affect pleasant    Telemetry   BiV pacing 70s  underlying rhythm a fib.  Personally reviewed   Labs    CBC  Recent Labs  07/30/17 0452 07/31/17 0500  WBC 6.4 6.3  HGB 8.9* 9.0*  HCT 27.9* 28.3*  MCV 90.9 94.0  PLT 116* 120*   Basic Metabolic Panel  Recent Labs  07/30/17 0452 07/30/17 0740 07/31/17 0500  NA  --  132* 133*  K  --  3.6 3.5  CL  --  96* 96*  CO2  --  23 24  GLUCOSE  --  100* 75  BUN  --  74* 54*  CREATININE  --  4.35* 3.65*  CALCIUM  --  8.7* 8.2*  MG 2.2  --  2.0  PHOS  --  6.7* 5.5*   Liver Function Tests  Recent Labs  07/30/17 0740 07/31/17 0500  ALBUMIN 1.6* 1.8*   No results for input(s): LIPASE, AMYLASE in the last 72 hours. Cardiac Enzymes No results for input(s): CKTOTAL, CKMB, CKMBINDEX, TROPONINI in the last 72 hours.  BNP: BNP (last 3 results)  Recent Labs  06/13/17 1205 07/09/17 2151  BNP 1,151.0* 1,553.9*    ProBNP (last 3 results) No results for input(s): PROBNP in the last 8760 hours.   D-Dimer No results for  input(s): DDIMER in the last 72 hours. Hemoglobin A1C No results for input(s): HGBA1C in the last 72 hours. Fasting Lipid Panel No results for input(s): CHOL, HDL, LDLCALC, TRIG, CHOLHDL, LDLDIRECT in the last 72 hours. Thyroid Function Tests No results for input(s): TSH, T4TOTAL, T3FREE, THYROIDAB in the last 72 hours.  Invalid input(s): FREET3  Other results:   Imaging   No results found.  Medications:     Scheduled Medications: . colchicine  0.3 mg Oral Daily  . darbepoetin (ARANESP) injection - NON-DIALYSIS  200 mcg Subcutaneous Q Wed-1800  . furosemide  160 mg Oral TID  . insulin aspart  0-5 Units Subcutaneous QHS  . insulin aspart  0-9 Units Subcutaneous TID WC  . insulin aspart  3 Units Subcutaneous TID WC  . insulin glargine  10 Units Subcutaneous QHS  . metolazone  10 mg Oral Daily  . midodrine  10 mg Oral TID WC  . sodium chloride flush  10-40 mL Intracatheter Q12H  . Warfarin - Pharmacist Dosing Inpatient   Does not apply q1800     Infusions: . sodium chloride 250 mL (07/30/17 0400)  .  ceFAZolin (ANCEF) IV 2 g (07/30/17 1723)    PRN Medications: sodium chloride, acetaminophen, alum & mag hydroxide-simeth, fentaNYL (SUBLIMAZE) injection, HYDROcodone-acetaminophen, magic mouthwash w/lidocaine, ondansetron (ZOFRAN) IV, sodium chloride flush    Patient Profile   [name] is a 61 year old with a history of DMII, SAH 2016, rectal cancer, CKD Stage IV, chronic A fib, S/P AVN ablation, PAF, CAD BMS LAD 2011 BMS LAD 2009, OSA, chronic systolic heart failure, St Jude BiV ICD admitted with AKI and shock with elevated lactate, suspect cardiogenic.   Assessment/Plan   1. Acute on chronic systolic CHF/cardiogenic shock: Last echo in 8/17 with EF 15-20%.  Ischemic cardiomyopathy, has St Jude BiV ICD (despite chronic afib appears to be effectively BiV pacing).  Had recent admission (discharged 06/21/17) requiring milrinone support and diuresis.  Admitted with soft BP (SBP 80s-90s) + elevated lactate.  Suspect mixed septic/cardiogenic shock with MSSA bacteremia.  - CVVHD stopped 07/20/17.  - Volume status improving. Suspect we may not be able to push weight dow much further but we will see.  -  No beta blocker with decompensation, hypotension.  - No ARB with AKI.   2. AKI on CKD stage 4:  - Volume status improving. Toleratted iHD but BP remains soft.  -Continue midodrine.  - Still making some urine  -Management per Nephrology  3. Atrial fibrillation: Chronic - Continue coumadin. No plans to extract device.  - INR 2.88  Discussed with PharmD  4. CAD:  - No ischemia s/s  - Continue ASA. No statin with recent elevated LFT's    5. ID:  - Blood CX 2/2 MSSA. Antibiotics narrowed, now on Ancef. 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   - Not planning ICD extraction for now due to severe comorbidities. He is device dependent.  - TEE 07/19/17 with no evidence of endocarditis.  -Per ID continue cefazolin until  9/15 the start renally-dosed cephalexin.  -PICC placed.    6. H/O Rectal CA with diverting colostomy   7. Type II diabetes:  - Continue sliding scale.   8. Liver failure:  LFTs elevated on admit . Resolved.   9. Anemia of chronic disease. - s/p RBCs on 8/17.  - Todays hgb 9.0  10. Deconditioning-- PT/OT recommending CIR. CIR following but will need to hold off until we see if he tolerates  iHD  Long talk with him and his wife yesterday about plan and prognosis. We will see how he tolerates iHD. If he doesn't tolerate HD will need Palliative Care for GOC and likely Hospice.    Length of Stay: 21  Arvilla Meres, MD  07/31/2017, 1:23 PM  Advanced Heart Failure Team Pager [phone] (M-F; 7a - 4p)  Please contact [facility] for night-coverage after hours (4p -7a ) and weekends on [email or web address]

## 2017-08-01 LAB — COOXEMETRY PANEL
CARBOXYHEMOGLOBIN: 1.5 % (ref 0.5–1.5)
METHEMOGLOBIN: 0.9 % (ref 0.0–1.5)
O2 SAT: 56.2 %
TOTAL HEMOGLOBIN: 14.4 g/dL (ref 12.0–16.0)

## 2017-08-01 LAB — CBC
HEMATOCRIT: 29.1 % — AB (ref 39.0–52.0)
Hemoglobin: 9.3 g/dL — ABNORMAL LOW (ref 13.0–17.0)
MCH: 29.8 pg (ref 26.0–34.0)
MCHC: 32 g/dL (ref 30.0–36.0)
MCV: 93.3 fL (ref 78.0–100.0)
PLATELETS: 143 10*3/uL — AB (ref 150–400)
RBC: 3.12 MIL/uL — ABNORMAL LOW (ref 4.22–5.81)
RDW: 18.8 % — AB (ref 11.5–15.5)
WBC: 7 10*3/uL (ref 4.0–10.5)

## 2017-08-01 LAB — GLUCOSE, CAPILLARY
GLUCOSE-CAPILLARY: 124 mg/dL — AB (ref 65–99)
Glucose-Capillary: 100 mg/dL — ABNORMAL HIGH (ref 65–99)
Glucose-Capillary: 135 mg/dL — ABNORMAL HIGH (ref 65–99)

## 2017-08-01 LAB — COMPREHENSIVE METABOLIC PANEL
ALT: <5 U/L — ABNORMAL LOW (ref 17–63)
AST: 20 U/L (ref 15–41)
Albumin: 1.8 g/dL — ABNORMAL LOW (ref 3.5–5.0)
Alkaline Phosphatase: 106 U/L (ref 38–126)
Anion gap: 14 (ref 5–15)
BILIRUBIN TOTAL: 0.8 mg/dL (ref 0.3–1.2)
BUN: 59 mg/dL — AB (ref 6–20)
CO2: 23 mmol/L (ref 22–32)
Calcium: 8.6 mg/dL — ABNORMAL LOW (ref 8.9–10.3)
Chloride: 94 mmol/L — ABNORMAL LOW (ref 101–111)
Creatinine, Ser: 4.09 mg/dL — ABNORMAL HIGH (ref 0.61–1.24)
GFR calc Af Amer: 17 mL/min — ABNORMAL LOW (ref >60)
GFR calc non Af Amer: 14 mL/min — ABNORMAL LOW (ref >60)
GLUCOSE: 137 mg/dL — AB (ref 65–99)
POTASSIUM: 3.3 mmol/L — AB (ref 3.5–5.1)
Sodium: 131 mmol/L — ABNORMAL LOW (ref 135–145)
TOTAL PROTEIN: 6.7 g/dL (ref 6.5–8.1)

## 2017-08-01 LAB — PROTIME-INR
INR: 2.84
Prothrombin Time: 30.4 seconds — ABNORMAL HIGH (ref 11.4–15.2)

## 2017-08-01 LAB — PHOSPHORUS: Phosphorus: 5.9 mg/dL — ABNORMAL HIGH (ref 2.5–4.6)

## 2017-08-01 MED ORDER — LIDOCAINE-PRILOCAINE 2.5-2.5 % EX CREA: 1.0000 "application " | TOPICAL_CREAM | CUTANEOUS | Status: DC | PRN

## 2017-08-01 MED ORDER — LIDOCAINE HCL (PF) 1 % IJ SOLN: 5.0000 mL | INTRAMUSCULAR | Status: DC | PRN

## 2017-08-01 MED ORDER — ALTEPLASE 2 MG IJ SOLR: 2.0000 mg | Freq: Once | INTRAMUSCULAR | Status: DC | PRN

## 2017-08-01 MED ORDER — SODIUM CHLORIDE 0.9 % IV SOLN: 100.0000 mL | INTRAVENOUS | Status: DC | PRN

## 2017-08-01 MED ORDER — WARFARIN SODIUM 2 MG PO TABS
2.0000 mg | ORAL_TABLET | Freq: Once | ORAL | Status: AC
  Administered 2017-08-01: 2 mg via ORAL
  Filled 2017-08-01: qty 1

## 2017-08-01 MED ORDER — RENA-VITE PO TABS
1.0000 | ORAL_TABLET | Freq: Every day | ORAL | Status: DC
  Administered 2017-08-01: 1 via ORAL
  Filled 2017-08-01: qty 1

## 2017-08-01 MED ORDER — SUCROFERRIC OXYHYDROXIDE 500 MG PO CHEW
500.0000 mg | CHEWABLE_TABLET | Freq: Three times a day (TID) | ORAL | Status: DC
  Administered 2017-08-01 – 2017-08-02 (×4): 500 mg via ORAL
  Filled 2017-08-01 (×5): qty 1

## 2017-08-01 MED ORDER — ENSURE ENLIVE PO LIQD
237.0000 mL | Freq: Two times a day (BID) | ORAL | Status: DC
  Administered 2017-08-02 (×2): 237 mL via ORAL

## 2017-08-01 MED ORDER — PENTAFLUOROPROP-TETRAFLUOROETH EX AERO: 1.0000 "application " | INHALATION_SPRAY | CUTANEOUS | Status: DC | PRN

## 2017-08-01 MED ORDER — HEPARIN SODIUM (PORCINE) 1000 UNIT/ML DIALYSIS: 1000.0000 [IU] | INTRAMUSCULAR | Status: DC | PRN

## 2017-08-01 MED ORDER — HEPARIN SODIUM (PORCINE) 1000 UNIT/ML DIALYSIS
100.0000 [IU]/kg | INTRAMUSCULAR | Status: DC | PRN
  Filled 2017-08-01: qty 8

## 2017-08-01 NOTE — Progress Notes (Signed)
Patient transported to HD 

## 2017-08-01 NOTE — Clinical Social Work Note (Signed)
CSW continues to follow for discharge needs.  Willies Laviolette, CSW [phone]  

## 2017-08-01 NOTE — Progress Notes (Signed)
Initial Nutrition Assessment  DOCUMENTATION CODES:   Severe malnutrition in context of chronic illness  INTERVENTION:   Ensure Enlive po BID, each supplement provides 350 kcal and 20 grams of protein (pt requesting Chocolate); monitor electrolytes and adjust supplement if needed  Bedtime snack (provided with Renal/Carb Mod diet)  Pt may benefit from liberalizing diet  Recommend addition of Renal MVI  Provide Renal Diet education on follow-up  NUTRITION DIAGNOSIS:   Malnutrition (Severe) related to chronic illness (CHF, CKD IV) as evidenced by severe depletion of body fat, severe depletion of muscle mass, severe fluid accumulation.  GOAL:   Patient will meet greater than or equal to 90% of their needs   MONITOR:   PO intake, Supplement acceptance, Labs, Weight trends  REASON FOR ASSESSMENT:   Malnutrition Screening Tool   ASSESSMENT:   61 yo male admitted with acute respiratory failure with pulmonary edema with acute on chronic CHF, found to also have MSSA bacteremia. Pt initially required vasopressor support and CVVHD. Pt with hx of CHF, CAD, CKD IV, colostomy, DM, HLD, MI.    8/15 CVVHD discontinued 8/25 IHD initiated  Pt reports appetite is fair, reports he is eating at least 50% of his meal trays, sometimes 100%. Pt reports at lunch today he ate a sandwich, rice pudding and grapes.  Pt reports several years ago he weighed over 300 pounds. Current wt 143 pounds. 17.8% wt loss in 1 month per weight encounters if weight is correct.   Nutrition-Focused physical exam completed. Findings are mild/moderate to severe fat depletion, mild/moderate to severe muscle depletion, and moderate/severe edema.   Labs: sodium 131, potassium 3.3, Creatnine 4.09, phosphorus 5.9, corrected calcium 10.4, albumin 1.8 Meds: lasix, ss novolog, lantus  Diet Order:  Diet renal/carb modified with fluid restriction Diet-HS Snack? Nothing; Room service appropriate? Yes; Fluid consistency:  Thin  Skin:  Wound (see comment) (venous stasis ulcers on LE)  Last BM:  8/23 colosotmy  Height:   Ht Readings from Last 1 Encounters:  07/24/17 5\' 10"  (1.778 m)    Weight:   Wt Readings from Last 1 Encounters:  08/01/17 143 lb 11.8 oz (65.2 kg)    Ideal Body Weight:     BMI:  Body mass index is 20.62 kg/m.  Estimated Nutritional Needs:   Kcal:  2025-2360 kcals  Protein:  102-120 g  Fluid:  1000 mL plus UOP  EDUCATION NEEDS:   No education needs identified at this time  [place], [place], LDN [phone] Pager  [phone] Weekend/On-Call Pager

## 2017-08-01 NOTE — Progress Notes (Signed)
Patient ID: [name], male   DOB: Jan 16, 1956, 61 y.o.   MRN: [id number]  [facility] Progress Note   Assessment/ Plan:   1. Staphylococcus bacteremia with septic/cardiogenic shock: intermittent hypotension especially with UF at HD- on midodrine. On ancef.  2. ESRD: continue HD on MWF schedule at this time and begin process for OP HD unit placement. Stop metolazone at this time and leave on furosemide 3. Anemia:no overt loss, continue aranesp 4. CKD-MBD:hyperphosphatemia noted-- start binder and monitor Ca/P. PTH low. 5. Nutrition:with evidence of protein-calorie malnutrition likely with poor wound healing.  6. Deconditioning: ongoing evaluation by PT/OT and plans for possible OP placement noted  Subjective:   Reports to be feeling fair at this time, denies any CP. Ambulated in room with assistance.    Objective:   BP (!) 80/53   Pulse 77   Temp 97.6 F (36.4 C) (Oral)   Resp 12   Ht 5\' 10"  (1.778 m)   Wt 67.5 kg (148 lb 13 oz)   SpO2 98%   BMI 21.35 kg/m   Physical Exam: ZGY:[id number] on HD, low BP noted CVS: Pulse regular rhythm, normal rate, 2/6 HSM over apex Resp:Decreased breath sounds over bases, no rales FMB:WGYK, flat, non-tender, bowel sounds normal Ext:1+ LE edema with both lower legs in gauze dressing  Labs: BMET  Recent Labs Lab 07/26/17 0409 07/27/17 0356 07/28/17 0430 07/29/17 0456 07/30/17 0740 07/31/17 0500 08/01/17 0724  NA 128* 131* 129* 130* 132* 133* 131*  K 3.5 3.4* 4.3 3.8 3.6 3.5 3.3*  CL 97* 98* 96* 96* 96* 96* 94*  CO2 21* 23 22 22 23 24 23   GLUCOSE 92 104* 137* 110* 100* 75 137*  BUN 53* 55* 61* 70* 74* 54* 59*  CREATININE 3.21* 3.44* 3.83* 4.10* 4.35* 3.65* 4.09*  CALCIUM 8.1* 8.4* 8.6* 8.8* 8.7* 8.2* 8.6*  PHOS 5.2* 6.0* 6.3* 6.3* 6.7* 5.5* 5.9*   CBC  Recent Labs Lab 07/29/17 0456 07/30/17 0452 07/31/17 0500 08/01/17 0724  WBC 5.7 6.4 6.3 7.0  HGB 8.5* 8.9* 9.0* 9.3*  HCT 26.9* 27.9* 28.3* 29.1*  MCV  90.9 90.9 94.0 93.3  PLT 102* 116* 120* 143*   Medications:    . colchicine  0.3 mg Oral Daily  . darbepoetin (ARANESP) injection - NON-DIALYSIS  200 mcg Subcutaneous Q Wed-1800  . furosemide  160 mg Oral TID  . insulin aspart  0-5 Units Subcutaneous QHS  . insulin aspart  0-9 Units Subcutaneous TID WC  . insulin aspart  3 Units Subcutaneous TID WC  . insulin glargine  10 Units Subcutaneous QHS  . metolazone  10 mg Oral Daily  . midodrine  10 mg Oral TID WC  . sodium chloride flush  10-40 mL Intracatheter Q12H  . Warfarin - Pharmacist Dosing Inpatient   Does not apply [id number]   Elmarie Shiley, MD 08/01/2017, 9:21 AM

## 2017-08-01 NOTE — Progress Notes (Signed)
PT Cancellation Note  Patient Details Name: [name] MRN: [id number] DOB: [birth year]   Cancelled Treatment:     Pt returned from HD but reports fatigue and that MD instructed him not to do anything with his right arm today. Pt declining mobility at this time but requests return next date. Will attempt next date.    [name] [name] 08/01/2017, 1:03 PM  Elwyn Reach, [place]

## 2017-08-01 NOTE — Progress Notes (Signed)
ANTICOAGULATION CONSULT NOTE - Follow Up Consult  Pharmacy Consult for Warfarin and Cefazolin Indication: atrial fibrillation and MSSA bacteremia  No Known Allergies  Patient Measurements: Height: 5\' 10"  (177.8 cm) Weight: 143 lb 11.8 oz (65.2 kg) IBW/kg (Calculated) : 73  Vital Signs: Temp: 97.6 F (36.4 C) (08/27 1019) Temp Source: Oral (08/27 1019) BP: 92/57 (08/27 1100) Pulse Rate: 77 (08/27 1100)  Labs:  Recent Labs  07/30/17 0452 07/30/17 0740 07/31/17 0500 08/01/17 0448 08/01/17 0724  HGB 8.9*  --  9.0*  --  9.3*  HCT 27.9*  --  28.3*  --  29.1*  PLT 116*  --  120*  --  143*  LABPROT 34.4*  --  30.8* 30.4*  --   INR 3.32  --  2.88 2.84  --   CREATININE  --  4.35* 3.65*  --  4.09*    Estimated Creatinine Clearance: 17.3 mL/min (A) (by C-G formula based on SCr of 4.09 mg/dL (H)).   Assessment: 60yom continues on coumadin for afib. Dose was held for 3 days due to supratherapeutic INR, resumed yesterday. Today's INR is at goal 2.84. CBC stable. No bleeding. PTA dose: 2mg  daily except 1mg  Tue/Thur  He also continues on cefazolin for MSSA bacteremia. TEE 8/14 without evidence of endocarditis or vegetation on valves/ICD. ICD unable to be removed. Planning for 6 weeks of total cefazolin then suppressive cephalexin. He was previously on CRRT and has now transitioned to intermittent dialysis with sessions 8/25 and today thus far.  Vanc 8/4 x1 Zosyn 8/4>> 8/5 Cefazolin 8/5 >>(9/16) Rifampin 8/5 >>8/8 (d/c'd d/t hyperbilirubinemia)   8/4 bld x2: MSSA in 2/2 8/4 urine: NG 8/5 MRSA PCR: neg 8/6 bld x2>ng   Goal of Therapy:  INR 2-3 Monitor platelets by anticoagulation protocol: Yes   Plan:  1) Coumadin 2mg  tonight 2) Daily INR 3) Continue cefazolin 2g IV q24   Nena Jordan, PharmD, BCPS 08/01/2017 2:47 PM

## 2017-08-01 NOTE — Progress Notes (Signed)
PROGRESS NOTE    [name]  XBM:[id number] DOB: [birth year] DOA: 07/09/2017 PCP: Elfredia Nevins, MD    Brief Narrative:  61 y.o.male with history of ischemic cardiomyopathy status post ICD placement. Also history of chronic renal failure. Admitted on 8/4 with acute respiratory failure secondary to pulmonary edema. Subsequently found to have a MSSA bacteremia. Patient has had persistent shock requiring vasopressor support and is on renal replacement therapy. He was initially admitted to [name] and transferred to [facility] service on 8/20.  Patient now significantly volume overloaded on high-dose oral diuretics and Zaroxolyn with worsening renal function. Patient initially received CVVHD which was subsequently discontinued. Due to worsening renal function patient underwent hemodialysis 07/30/2017 which she tolerated per nephrology. Patient for another hemodialysis session 08/01/2017. Patient is to continue with diuretics as well. Nephrology and heart failure team following.    Assessment & Plan:   Principal Problem:   Staphylococcus aureus bacteremia with sepsis [facility]) Active Problems:   Ischemic cardiomyopathy-EF 35% 04/24/15 echo   Long term current use of anticoagulant therapy   CAD with LCX disease and stents to LAD   Chronic a-fib (HCC)   Chronic kidney disease, stage IV (severe) (HCC)   OSA on CPAP   Hyperlipidemia   BiV ICD ([employer] Jude) gen change 04/10/15   Chronic atrial fibrillation (HCC)   Right heart failure (HCC)   Septic shock (HCC)   Cardiorenal syndrome with renal failure   Infected defibrillator (HCC)   Septic hip (HCC)   Severe sepsis with septic shock (HCC)   Bacteremia due to methicillin susceptible Staphylococcus aureus (MSSA)   Acute on chronic systolic CHF (congestive heart failure) (HCC)  #1 MSSA bacteremia/sepsis Patient improving clinically. Afebrile. No leukocytosis. Repeat blood cultures from 07/11/2017 negative to date.TEE 07/19/2017 negative for endocarditis or  vegetation on valves/ICD. Patient status post double-lumen central line and interventional radiology. Patient is to receive 6 weeks of IV antibiotics through 08/20/2017 and then subsequently be converted to chronic suppressive renally dosed oral cephalexin per ID recommendations. Patient will follow-up with ID in the outpatient setting.   #2 acute on chronic systolic heart failure/cardiogenic shock Clinically improved after a couple of hemodialysis sessions. Patient with a poor ejection fraction. 2-D echo from August 2018 with a EF of 15-20%. Patient also noted with low blood pressure with systolics now in the 90s. Patient with low blood pressure felt to be secondary to sepsis and cardiogenic shock. Volume overload improving with hemodialysis and diuretics.  Beta blocker on hold secondary to acute decompensation. Unable to place an ACE inhibitor or ARB due to worsening renal function. Patient with a urine output of 750 mL over the past 24 hours and patient is - 16.888 L during this hospitalization. Continue current dose of Lasix 160 mg orally 3 times daily and HD. Zaroxolyn has been discontinued per nephrology. Cardiology/heart failure team following and appreciate input and recommendations.  #3 acute on chronic kidney disease stage IV Patient with worsening renal function and creatinine despite diuresis and creatinine was 4.35 on 07/30/2017. Patient underwent hemodialysis yesterday 07/30/2017 which he tolerated and per nephrology did well. Creatinine currently at 4.09 from 3.65 today from 4.35 from 4.10 from 3.83 on 07/28/2017. Patient just returned from hemodialysis today. Patient did receive CVVHD early on in the hospitalization which has subsequently been discontinued. Patient with a urine output of 750 mL over the past 24 hours on diuretics. 2.5 L of fluid removed during hemodialysis on 07/30/2017. 2.26 L removed during hemodialysis 08/01/2017. Patient is a -  16.888 L during this hospitalization. Blood  pressure is in the 80s to 90s however patient asymptomatic. Continue Midrin 10 mg by mouth 3 times a day. Continue current dose of Lasix. Zaroxolyn has been discontinued per nephrology. Per nephrology.  #4 cirrhosis Per CT abdomen and pelvis. Continue with diuretics. Outpatient follow-up.   #5 well-controlled DM 2 Hemoglobin A1c 6.6. CBGs have ranged from 100-124. Continue current dose of Lantus and sliding scale insulin.  #6 history of gout No complaints. Continue colchicine.   #7 anemia Anemia of chronic kidney disease. Hemoglobin stable at 9.3. Status post 2 units packed red blood cells 07/22/2017. Aranesp per nephrology.   #8 chronic atrial fibrillation Rate is currently controlled. INR is 2.84. Continue Coumadin for anticoagulation. Per cardiology.   #9 H/O rectal CA with diverting colostomy Stable. Outpatient follow-up.  DVT prophylaxis: coumadin Code Status: full Family Communication: Updated patient. No family at bedside.  Disposition Plan: CIR versus skilled nursing facility pending hospital outcome.   Consultants:   CIR: Dr. Hermelinda Medicus 07/26/2017   electrophysiology: Dr. Johney Frame 07/14/2017  Cardiology/heart failure Dr.Qureshi 07/10/2017  Nephrology: Dr. Eliott Nine 07/11/2017  Infectious disease: Dr. Daiva Eves 07/11/2017  Procedures: TEE 8/14: EF 20% with diffuse hypokinesis. Moderate hypokinesis of RV. ICD/pacing wires noted without vegetation. Left atrium dilated without thrombus. Right atrium dilated and pacing wires without vegetation. Mild aortic insufficiency without vegetation. Mild mitral regurgitation without vegetation. Moderate to severe tricuspid regurgitation without vegetation. Pulmonic valve without vegetation. No PFO or ASD. No pericardial effusion. CT right hip 07/11/2017 CT abdomen and pelvis 07/10/2017 2-D echo 07/10/2017 Transfused 2 units packed red blood cells 07/22/2017 Right IJV tunneled PICC per Dr. Bonnielee Haff 07/27/2017    SIGNIFICANT EVENTS: 08/04  - Admit 08/05 - Started on CVVHD 08/15 - CVVHD stopped  8/20 - transfer to [facility] service.  8/25--HD   Antimicrobials:   IV Ancef 07/10/2017>>>>> 08/20/2017>>>> renally dosed cephalexin   IV vancomycin 07/10/2017>>> 07/11/2017   Subjective: Patient returning from hemodialysis. Patient tolerated hemodialysis. No chest pain. No shortness of breath. No dizziness. No lightheadedness. Systolic blood pressures in the 80s to 90s.   Objective: Vitals:   08/01/17 1100 08/01/17 1453 08/01/17 1518 08/01/17 1921  BP: (!) 92/57 (!) 75/56 (!) 93/59 (!) 92/56  Pulse: 77 73 72 76  Resp: 11 13 17 15   Temp:  97.8 F (36.6 C)  98 F (36.7 C)  TempSrc:  Oral  Oral  SpO2: 100% 97% 96% 97%  Weight:      Height:        Intake/Output Summary (Last 24 hours) at 08/01/17 2004 Last data filed at 08/01/17 1737  Gross per 24 hour  Intake              240 ml  Output             2226 ml  Net            -1986 ml   Filed Weights   08/01/17 0443 08/01/17 0710 08/01/17 1019  Weight: 73.1 kg (161 lb 2.5 oz) 67.5 kg (148 lb 13 oz) 65.2 kg (143 lb 11.8 oz)    Examination:  General exam: In bed. Respiratory system: CTAB anterior lung fields.   Respiratory effort normal. Cardiovascular system: RRR with 3/6 SEM. Trace-1 + BLE edema. Gastrointestinal system: abdomen is soft, nondistended,Nontender, positive bowel sounds. Ostomy pouching intact. Central nervous system: Alert and oriented. No focal neurological deficits. Extremities: 1 + Bilateral lower extremity edema. Skin: Bandages on lower shins. Chronic venous  stasis changes.  Psychiatry: Judgement and insight is fair. Mood & affect appropriate.     Data Reviewed: I have personally reviewed following labs and imaging studies  CBC:  Recent Labs Lab 07/28/17 0430 07/29/17 0456 07/30/17 0452 07/31/17 0500 08/01/17 0724  WBC 7.2 5.7 6.4 6.3 7.0  HGB 8.7* 8.5* 8.9* 9.0* 9.3*  HCT 27.6* 26.9* 27.9* 28.3* 29.1*  MCV 90.5 90.9 90.9 94.0 93.3    PLT 113* 102* 116* 120* 143*   Basic Metabolic Panel:  Recent Labs Lab 07/27/17 0356 07/28/17 0430 07/29/17 0456 07/30/17 0452 07/30/17 0740 07/31/17 0500 08/01/17 0724  NA 131* 129* 130*  --  132* 133* 131*  K 3.4* 4.3 3.8  --  3.6 3.5 3.3*  CL 98* 96* 96*  --  96* 96* 94*  CO2 23 22 22   --  23 24 23   GLUCOSE 104* 137* 110*  --  100* 75 137*  BUN 55* 61* 70*  --  74* 54* 59*  CREATININE 3.44* 3.83* 4.10*  --  4.35* 3.65* 4.09*  CALCIUM 8.4* 8.6* 8.8*  --  8.7* 8.2* 8.6*  MG 1.9 1.9 2.3 2.2  --  2.0  --   PHOS 6.0* 6.3* 6.3*  --  6.7* 5.5* 5.9*   GFR: Estimated Creatinine Clearance: 17.3 mL/min (A) (by C-G formula based on SCr of 4.09 mg/dL (H)). Liver Function Tests:  Recent Labs Lab 07/26/17 0930  07/28/17 0430 07/29/17 0456 07/30/17 0740 07/31/17 0500 08/01/17 0724  AST 22  --   --   --   --   --  20  ALT <5*  --   --   --   --   --  <5*  ALKPHOS 102  --   --   --   --   --  106  BILITOT 1.2  --   --   --   --   --  0.8  PROT 6.6  --   --   --   --   --  6.7  ALBUMIN 1.8*  < > 1.8* 1.6* 1.6* 1.8* 1.8*  < > = values in this interval not displayed. No results for input(s): LIPASE, AMYLASE in the last 168 hours. No results for input(s): AMMONIA in the last 168 hours. Coagulation Profile:  Recent Labs Lab 07/28/17 0430 07/29/17 0456 07/30/17 0452 07/31/17 0500 08/01/17 0448  INR 3.35 3.26 3.32 2.88 2.84   Cardiac Enzymes: No results for input(s): CKTOTAL, CKMB, CKMBINDEX, TROPONINI in the last 168 hours. BNP (last 3 results) No results for input(s): PROBNP in the last 8760 hours. HbA1C: No results for input(s): HGBA1C in the last 72 hours. CBG:  Recent Labs Lab 07/31/17 1241 07/31/17 1526 07/31/17 2019 08/01/17 1159 08/01/17 1649  GLUCAP 126* 168* 142* 100* 124*   Lipid Profile: No results for input(s): CHOL, HDL, LDLCALC, TRIG, CHOLHDL, LDLDIRECT in the last 72 hours. Thyroid Function Tests: No results for input(s): TSH, T4TOTAL, FREET4,  T3FREE, THYROIDAB in the last 72 hours. Anemia Panel: No results for input(s): VITAMINB12, FOLATE, FERRITIN, TIBC, IRON, RETICCTPCT in the last 72 hours. Sepsis Labs: No results for input(s): PROCALCITON, LATICACIDVEN in the last 168 hours.  No results found for this or any previous visit (from the past 240 hour(s)).       Radiology Studies: No results found.      Scheduled Meds: . colchicine  0.3 mg Oral Daily  . darbepoetin (ARANESP) injection - NON-DIALYSIS  200 mcg Subcutaneous Q Wed-1800  .  feeding supplement (ENSURE ENLIVE)  237 mL Oral BID BM  . furosemide  160 mg Oral TID  . insulin aspart  0-5 Units Subcutaneous QHS  . insulin aspart  0-9 Units Subcutaneous TID WC  . insulin aspart  3 Units Subcutaneous TID WC  . insulin glargine  10 Units Subcutaneous QHS  . midodrine  10 mg Oral TID WC  . multivitamin  1 tablet Oral QHS  . sodium chloride flush  10-40 mL Intracatheter Q12H  . sucroferric oxyhydroxide  500 mg Oral TID WC  . Warfarin - Pharmacist Dosing Inpatient   Does not apply q1800   Continuous Infusions: . sodium chloride 250 mL (07/30/17 0400)  .  ceFAZolin (ANCEF) IV Stopped (08/01/17 1807)     LOS: 22 days    Time spent: 35 minutes    Jearline Hirschhorn, MD [facility] Pager [phone]  If 7PM-7AM, please contact night-coverage [email or web address] Password [facility] 08/01/2017, 8:04 PM

## 2017-08-01 NOTE — Procedures (Signed)
Patient seen on Hemodialysis. QB 200, UF goal 3L, Low BP 80/39 Treatment adjusted as needed.  Elmarie Shiley MD [facility] Office # [phone] Pager # [phone] 9:18 AM

## 2017-08-01 NOTE — Progress Notes (Signed)
PT Cancellation Note  Patient Details Name: [name] MRN: [id number] DOB: 1956/11/18   Cancelled Treatment:    Reason Eval/Treat Not Completed: Patient at procedure or test/unavailable (pt in HD and unavailable)   [name] 08/01/2017, 7:34 AM  Elwyn Reach, [place]

## 2017-08-01 NOTE — Progress Notes (Signed)
Patient ID: [name] [name], male   DOB: [birth year], 61 y.o.   MRN: [id number]     Advanced Heart Failure Rounding Note  Primary Cardiologist: [name] [name]  Subjective:    Admitted with staph bacteremia with mixed septic/cardiogenic shock.   TEE 07/19/17 with LVEF 20%. No evidence of endocarditis or vegetation on valves/ICD  CVVD stopped 07/20/17.   iHD started 8/25. Marland Kitchen   Feeling OK. Denies dyspnea or lightheadedness. Working with PT.   Currently receiving iHD. BP in 80-90s on midodrine. Weight 148 this am. Inacurate.   Objective:   Weight Range: 148 lb 13 oz (67.5 kg) Body mass index is 21.35 kg/m.   Vital Signs:   Temp:  [97.1 F (36.2 C)-97.9 F (36.6 C)] 97.6 F (36.4 C) (08/27 0710) Pulse Rate:  [70-78] 77 (08/27 0830) Resp:  [12-18] 12 (08/27 0718) BP: (86-99)/(50-60) 89/53 (08/27 0830) SpO2:  [95 %-98 %] 98 % (08/27 0710) Weight:  [148 lb 13 oz (67.5 kg)-161 lb 2.5 oz (73.1 kg)] 148 lb 13 oz (67.5 kg) (08/27 0710) Last BM Date: 07/28/17  Weight change: Filed Weights   07/31/17 0403 08/01/17 0443 08/01/17 0710  Weight: 174 lb 6.1 oz (79.1 kg) 161 lb 2.5 oz (73.1 kg) 148 lb 13 oz (67.5 kg)    Intake/Output:   Intake/Output Summary (Last 24 hours) at 08/01/17 4098 Last data filed at 07/31/17 2000  Gross per 24 hour  Intake              320 ml  Output              750 ml  Net             -430 ml      Physical Exam   General: Weak and chronically ill appearing. NAD.  HEENT: Normal Neck: Supple. JVP jaw. Carotids 2+ bilat; no bruits. No thyromegaly or nodule noted. Cor: PMI nondisplaced. RRR, 2/6 AS, 2/6 TR Lungs: CTAB, normal effort. Abdomen: Soft, non-tender, non-distended, no HSM. No bruits or masses. +BS  Extremities: No cyanosis, clubbing, or rash. Trace to 1+ edema.   Neuro: Alert & orientedx3, cranial nerves grossly intact. moves all 4 extremities w/o difficulty. Affect pleasant   Telemetry   Personally reviewed, BiV pacing 70s , underlying  rhythm a fib.   Labs    CBC  Recent Labs  07/31/17 0500 08/01/17 0724  WBC 6.3 7.0  HGB 9.0* 9.3*  HCT 28.3* 29.1*  MCV 94.0 93.3  PLT 120* 143*   Basic Metabolic Panel  Recent Labs  07/30/17 0452  07/31/17 0500 08/01/17 0724  NA  --   < > 133* 131*  K  --   < > 3.5 3.3*  CL  --   < > 96* 94*  CO2  --   < > 24 23  GLUCOSE  --   < > 75 137*  BUN  --   < > 54* 59*  CREATININE  --   < > 3.65* 4.09*  CALCIUM  --   < > 8.2* 8.6*  MG 2.2  --  2.0  --   PHOS  --   < > 5.5* 5.9*  < > = values in this interval not displayed. Liver Function Tests  Recent Labs  07/31/17 0500 08/01/17 0724  AST  --  20  ALT  --  <5*  ALKPHOS  --  106  BILITOT  --  0.8  PROT  --  6.7  ALBUMIN 1.8* 1.8*  No results for input(s): LIPASE, AMYLASE in the last 72 hours. Cardiac Enzymes No results for input(s): CKTOTAL, CKMB, CKMBINDEX, TROPONINI in the last 72 hours.  BNP: BNP (last 3 results)  Recent Labs  06/13/17 1205 07/09/17 2151  BNP 1,151.0* 1,553.9*    ProBNP (last 3 results) No results for input(s): PROBNP in the last 8760 hours.   D-Dimer No results for input(s): DDIMER in the last 72 hours. Hemoglobin A1C No results for input(s): HGBA1C in the last 72 hours. Fasting Lipid Panel No results for input(s): CHOL, HDL, LDLCALC, TRIG, CHOLHDL, LDLDIRECT in the last 72 hours. Thyroid Function Tests No results for input(s): TSH, T4TOTAL, T3FREE, THYROIDAB in the last 72 hours.  Invalid input(s): FREET3  Other results:   Imaging   No results found.  Medications:     Scheduled Medications: . colchicine  0.3 mg Oral Daily  . darbepoetin (ARANESP) injection - NON-DIALYSIS  200 mcg Subcutaneous Q Wed-1800  . furosemide  160 mg Oral TID  . insulin aspart  0-5 Units Subcutaneous QHS  . insulin aspart  0-9 Units Subcutaneous TID WC  . insulin aspart  3 Units Subcutaneous TID WC  . insulin glargine  10 Units Subcutaneous QHS  . metolazone  10 mg Oral Daily  .  midodrine  10 mg Oral TID WC  . sodium chloride flush  10-40 mL Intracatheter Q12H  . Warfarin - Pharmacist Dosing Inpatient   Does not apply q1800    Infusions: . sodium chloride 250 mL (07/30/17 0400)  . sodium chloride    . sodium chloride    .  ceFAZolin (ANCEF) IV 2 g (07/31/17 1810)    PRN Medications: sodium chloride, sodium chloride, sodium chloride, acetaminophen, alteplase, alum & mag hydroxide-simeth, fentaNYL (SUBLIMAZE) injection, heparin, heparin, HYDROcodone-acetaminophen, lidocaine (PF), lidocaine-prilocaine, magic mouthwash w/lidocaine, ondansetron (ZOFRAN) IV, pentafluoroprop-tetrafluoroeth, sodium chloride flush    Patient Profile   [name] [name] is a 61 year old with a history of DMII, SAH 2016, rectal cancer, CKD Stage IV, chronic A fib, S/P AVN ablation, PAF, CAD BMS LAD 2011 BMS LAD 2009, OSA, chronic systolic heart failure, [name] [name] BiV ICD admitted with AKI and shock with elevated lactate, suspect cardiogenic.   Assessment/Plan   1. Acute on chronic systolic CHF/cardiogenic shock: Last echo in 8/17 with EF 15-20%.  Ischemic cardiomyopathy, has [name] [name] BiV ICD (despite chronic afib appears to be effectively BiV pacing).  Had recent admission (discharged 06/21/17) requiring milrinone support and diuresis.  Admitted with soft BP (SBP 80s-90s) + elevated lactate.  Suspect mixed septic/cardiogenic shock with MSSA bacteremia.  - CVVHD stopped 07/20/17.  - Volume status improving somewhat. Weights inaccurate.  - Currently undergoing trial of iHD.  -  No beta blocker with decompensation, hypotension.  - No ARB with AKI.   2. AKI on CKD stage 4:  - Volume status OK. Tolerating iHD thus far but BP remains soft.  -Continue midodrine.  - Still making some urine  - Management per Nephrology  3. Atrial fibrillation: Chronic - Continue coumadin. No plans to extract device.  - INR 2.84. Discussed with PharmD  4. CAD:  - No ischemia s/s  - Continue ASA. No statin with  recent elevated LFT's    5. ID:  - Blood CX 2/2 MSSA. Antibiotics narrowed, now on Ancef. 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   - Not planning ICD extraction for now due to severe comorbidities. He is device dependent.  - TEE 07/19/17 with no evidence  of endocarditis.  -Per ID continue cefazolin until 9/15 the start renally-dosed cephalexin.  -PICC placed.    6. H/O Rectal CA with diverting colostomy   7. Type II diabetes:  - Continue sliding scale. No change.   8. Liver failure:  LFTs elevated on admit. Resolved.   9. Anemia of chronic disease. - s/p RBCs on 8/17.  - Todays hgb 9.3. No overt bleeding.   10. Deconditioning-- PT/OT recommending CIR. CIR following but will need to hold off until we see if he tolerates iHD. No change.   If he does not tolerate HD, will need palliative care for GOC.   Length of Stay: [address]  Graciella Freer, [place]  08/01/2017, 9:03 AM  Advanced Heart Failure Team Pager [phone] (M-F; 7a - 4p)  Please contact [facility] for night-coverage after hours (4p -7a ) and weekends on [email or web address]  Patient seen and examined with the above-signed Advanced Practice Provider and/or Housestaff. I personally reviewed laboratory data, imaging studies and relevant notes. I independently examined the patient and formulated the important aspects of the plan. I have edited the note to reflect any of my changes or salient points. I have personally discussed the plan with the patient and/or family.  Tolerating iHD but remains very weak. BP soft on midodrine. Weight way down. I think he is ready to transition to CIR if he is a candidate. Continue abx for bacteremia.  INR ok. D/w PharmD.   Arvilla Meres, MD  6:49 PM

## 2017-08-02 ENCOUNTER — Inpatient Hospital Stay (HOSPITAL_COMMUNITY)
Admission: RE | Admit: 2017-08-02 | Discharge: 2017-08-20 | DRG: 939 | Disposition: A | Discharge status: Home Health | Payer: Medicare Other | Source: Intra-hospital | Attending: Physical Medicine & Rehabilitation | Admitting: Physical Medicine & Rehabilitation

## 2017-08-02 ENCOUNTER — Encounter (HOSPITAL_COMMUNITY): Payer: Self-pay | Admitting: *Deleted

## 2017-08-02 DIAGNOSIS — D62 Acute posthemorrhagic anemia: Secondary | ICD-10-CM | POA: Diagnosis not present

## 2017-08-02 DIAGNOSIS — E11621 Type 2 diabetes mellitus with foot ulcer: Secondary | ICD-10-CM | POA: Diagnosis present

## 2017-08-02 DIAGNOSIS — I255 Ischemic cardiomyopathy: Secondary | ICD-10-CM | POA: Diagnosis present

## 2017-08-02 DIAGNOSIS — B9561 Methicillin susceptible Staphylococcus aureus infection as the cause of diseases classified elsewhere: Secondary | ICD-10-CM | POA: Diagnosis present

## 2017-08-02 DIAGNOSIS — I132 Hypertensive heart and chronic kidney disease with heart failure and with stage 5 chronic kidney disease, or end stage renal disease: Secondary | ICD-10-CM | POA: Diagnosis present

## 2017-08-02 DIAGNOSIS — Z85048 Personal history of other malignant neoplasm of rectum, rectosigmoid junction, and anus: Secondary | ICD-10-CM

## 2017-08-02 DIAGNOSIS — I482 Chronic atrial fibrillation: Secondary | ICD-10-CM | POA: Diagnosis present

## 2017-08-02 DIAGNOSIS — E1142 Type 2 diabetes mellitus with diabetic polyneuropathy: Secondary | ICD-10-CM | POA: Diagnosis present

## 2017-08-02 DIAGNOSIS — N179 Acute kidney failure, unspecified: Secondary | ICD-10-CM | POA: Diagnosis present

## 2017-08-02 DIAGNOSIS — Z794 Long term (current) use of insulin: Secondary | ICD-10-CM

## 2017-08-02 DIAGNOSIS — I2510 Atherosclerotic heart disease of native coronary artery without angina pectoris: Secondary | ICD-10-CM | POA: Diagnosis present

## 2017-08-02 DIAGNOSIS — I252 Old myocardial infarction: Secondary | ICD-10-CM | POA: Diagnosis not present

## 2017-08-02 DIAGNOSIS — I5023 Acute on chronic systolic (congestive) heart failure: Secondary | ICD-10-CM | POA: Diagnosis not present

## 2017-08-02 DIAGNOSIS — Z8249 Family history of ischemic heart disease and other diseases of the circulatory system: Secondary | ICD-10-CM

## 2017-08-02 DIAGNOSIS — E1122 Type 2 diabetes mellitus with diabetic chronic kidney disease: Secondary | ICD-10-CM | POA: Diagnosis present

## 2017-08-02 DIAGNOSIS — E669 Obesity, unspecified: Secondary | ICD-10-CM | POA: Diagnosis present

## 2017-08-02 DIAGNOSIS — E785 Hyperlipidemia, unspecified: Secondary | ICD-10-CM | POA: Diagnosis present

## 2017-08-02 DIAGNOSIS — K219 Gastro-esophageal reflux disease without esophagitis: Secondary | ICD-10-CM | POA: Diagnosis present

## 2017-08-02 DIAGNOSIS — R7881 Bacteremia: Secondary | ICD-10-CM | POA: Diagnosis present

## 2017-08-02 DIAGNOSIS — Z95810 Presence of automatic (implantable) cardiac defibrillator: Secondary | ICD-10-CM

## 2017-08-02 DIAGNOSIS — N186 End stage renal disease: Secondary | ICD-10-CM

## 2017-08-02 DIAGNOSIS — N184 Chronic kidney disease, stage 4 (severe): Secondary | ICD-10-CM | POA: Diagnosis not present

## 2017-08-02 DIAGNOSIS — Z955 Presence of coronary angioplasty implant and graft: Secondary | ICD-10-CM

## 2017-08-02 DIAGNOSIS — R5381 Other malaise: Secondary | ICD-10-CM | POA: Diagnosis present

## 2017-08-02 DIAGNOSIS — I480 Paroxysmal atrial fibrillation: Secondary | ICD-10-CM | POA: Diagnosis present

## 2017-08-02 DIAGNOSIS — I959 Hypotension, unspecified: Secondary | ICD-10-CM | POA: Diagnosis not present

## 2017-08-02 DIAGNOSIS — Z992 Dependence on renal dialysis: Secondary | ICD-10-CM

## 2017-08-02 DIAGNOSIS — D638 Anemia in other chronic diseases classified elsewhere: Secondary | ICD-10-CM | POA: Diagnosis present

## 2017-08-02 DIAGNOSIS — D696 Thrombocytopenia, unspecified: Secondary | ICD-10-CM | POA: Diagnosis not present

## 2017-08-02 DIAGNOSIS — I5022 Chronic systolic (congestive) heart failure: Secondary | ICD-10-CM

## 2017-08-02 DIAGNOSIS — R188 Other ascites: Secondary | ICD-10-CM | POA: Diagnosis present

## 2017-08-02 DIAGNOSIS — Z8673 Personal history of transient ischemic attack (TIA), and cerebral infarction without residual deficits: Secondary | ICD-10-CM

## 2017-08-02 DIAGNOSIS — G4733 Obstructive sleep apnea (adult) (pediatric): Secondary | ICD-10-CM | POA: Diagnosis present

## 2017-08-02 DIAGNOSIS — K7460 Unspecified cirrhosis of liver: Secondary | ICD-10-CM | POA: Diagnosis present

## 2017-08-02 DIAGNOSIS — Z79899 Other long term (current) drug therapy: Secondary | ICD-10-CM

## 2017-08-02 DIAGNOSIS — E43 Unspecified severe protein-calorie malnutrition: Secondary | ICD-10-CM | POA: Diagnosis present

## 2017-08-02 DIAGNOSIS — R570 Cardiogenic shock: Secondary | ICD-10-CM | POA: Diagnosis not present

## 2017-08-02 DIAGNOSIS — Z8546 Personal history of malignant neoplasm of prostate: Secondary | ICD-10-CM

## 2017-08-02 DIAGNOSIS — E162 Hypoglycemia, unspecified: Secondary | ICD-10-CM

## 2017-08-02 DIAGNOSIS — L89612 Pressure ulcer of right heel, stage 2: Secondary | ICD-10-CM | POA: Diagnosis present

## 2017-08-02 DIAGNOSIS — Z6825 Body mass index (BMI) 25.0-25.9, adult: Secondary | ICD-10-CM

## 2017-08-02 DIAGNOSIS — E11649 Type 2 diabetes mellitus with hypoglycemia without coma: Secondary | ICD-10-CM | POA: Diagnosis present

## 2017-08-02 DIAGNOSIS — I951 Orthostatic hypotension: Secondary | ICD-10-CM | POA: Diagnosis not present

## 2017-08-02 DIAGNOSIS — Z433 Encounter for attention to colostomy: Secondary | ICD-10-CM | POA: Diagnosis not present

## 2017-08-02 DIAGNOSIS — Z933 Colostomy status: Secondary | ICD-10-CM

## 2017-08-02 DIAGNOSIS — Z7901 Long term (current) use of anticoagulants: Secondary | ICD-10-CM

## 2017-08-02 DIAGNOSIS — M109 Gout, unspecified: Secondary | ICD-10-CM | POA: Diagnosis present

## 2017-08-02 DIAGNOSIS — R791 Abnormal coagulation profile: Secondary | ICD-10-CM | POA: Diagnosis not present

## 2017-08-02 DIAGNOSIS — L539 Erythematous condition, unspecified: Secondary | ICD-10-CM

## 2017-08-02 DIAGNOSIS — L89150 Pressure ulcer of sacral region, unstageable: Secondary | ICD-10-CM | POA: Diagnosis present

## 2017-08-02 DIAGNOSIS — T07XXXA Unspecified multiple injuries, initial encounter: Secondary | ICD-10-CM

## 2017-08-02 DIAGNOSIS — Z800 Family history of malignant neoplasm of digestive organs: Secondary | ICD-10-CM

## 2017-08-02 DIAGNOSIS — Z452 Encounter for adjustment and management of vascular access device: Secondary | ICD-10-CM | POA: Diagnosis not present

## 2017-08-02 LAB — RENAL FUNCTION PANEL
ALBUMIN: 1.8 g/dL — AB (ref 3.5–5.0)
ANION GAP: 11 (ref 5–15)
Albumin: 1.9 g/dL — ABNORMAL LOW (ref 3.5–5.0)
Anion gap: 11 (ref 5–15)
BUN: 44 mg/dL — ABNORMAL HIGH (ref 6–20)
BUN: 45 mg/dL — AB (ref 6–20)
CALCIUM: 8.4 mg/dL — AB (ref 8.9–10.3)
CO2: 26 mmol/L (ref 22–32)
CO2: 27 mmol/L (ref 22–32)
CREATININE: 3.57 mg/dL — AB (ref 0.61–1.24)
Calcium: 8.7 mg/dL — ABNORMAL LOW (ref 8.9–10.3)
Chloride: 95 mmol/L — ABNORMAL LOW (ref 101–111)
Chloride: 94 mmol/L — ABNORMAL LOW (ref 101–111)
Creatinine, Ser: 3.45 mg/dL — ABNORMAL HIGH (ref 0.61–1.24)
GFR calc Af Amer: 20 mL/min — ABNORMAL LOW (ref >60)
GFR calc non Af Amer: 18 mL/min — ABNORMAL LOW (ref >60)
GFR, EST AFRICAN AMERICAN: 20 mL/min — AB (ref >60)
GFR, EST NON AFRICAN AMERICAN: 17 mL/min — AB (ref >60)
GLUCOSE: 100 mg/dL — AB (ref 65–99)
Glucose, Bld: 157 mg/dL — ABNORMAL HIGH (ref 65–99)
PHOSPHORUS: 5 mg/dL — AB (ref 2.5–4.6)
POTASSIUM: 3.6 mmol/L (ref 3.5–5.1)
POTASSIUM: 3.7 mmol/L (ref 3.5–5.1)
Phosphorus: 5 mg/dL — ABNORMAL HIGH (ref 2.5–4.6)
SODIUM: 132 mmol/L — AB (ref 135–145)
Sodium: 132 mmol/L — ABNORMAL LOW (ref 135–145)

## 2017-08-02 LAB — COOXEMETRY PANEL
CARBOXYHEMOGLOBIN: 1.8 % — AB (ref 0.5–1.5)
METHEMOGLOBIN: 1.2 % (ref 0.0–1.5)
O2 Saturation: 58.6 %
Total hemoglobin: 9.6 g/dL — ABNORMAL LOW (ref 12.0–16.0)

## 2017-08-02 LAB — CBC
HEMATOCRIT: 29.7 % — AB (ref 39.0–52.0)
Hemoglobin: 9.2 g/dL — ABNORMAL LOW (ref 13.0–17.0)
MCH: 29.6 pg (ref 26.0–34.0)
MCHC: 31 g/dL (ref 30.0–36.0)
MCV: 95.5 fL (ref 78.0–100.0)
PLATELETS: 143 10*3/uL — AB (ref 150–400)
RBC: 3.11 MIL/uL — AB (ref 4.22–5.81)
RDW: 18.9 % — AB (ref 11.5–15.5)
WBC: 9.9 10*3/uL (ref 4.0–10.5)

## 2017-08-02 LAB — GLUCOSE, CAPILLARY
GLUCOSE-CAPILLARY: 103 mg/dL — AB (ref 65–99)
Glucose-Capillary: 89 mg/dL (ref 65–99)
Glucose-Capillary: 121 mg/dL — ABNORMAL HIGH (ref 65–99)

## 2017-08-02 LAB — PROTIME-INR
INR: 2.98
Prothrombin Time: 31.6 seconds — ABNORMAL HIGH (ref 11.4–15.2)

## 2017-08-02 MED ORDER — SORBITOL 70 % SOLN: 30.0000 mL | Freq: Every day | Status: DC | PRN

## 2017-08-02 MED ORDER — WARFARIN - PHARMACIST DOSING INPATIENT
Freq: Every day | Status: DC
  Administered 2017-08-11 – 2017-08-19 (×2)

## 2017-08-02 MED ORDER — DARBEPOETIN ALFA 200 MCG/0.4ML IJ SOSY
200.0000 ug | PREFILLED_SYRINGE | INTRAMUSCULAR | Status: DC
  Administered 2017-08-03 – 2017-08-10 (×2): 200 ug via SUBCUTANEOUS
  Filled 2017-08-02 (×3): qty 0.4

## 2017-08-02 MED ORDER — RENA-VITE PO TABS
1.0000 | ORAL_TABLET | Freq: Every day | ORAL | Status: DC
  Administered 2017-08-02 – 2017-08-19 (×17): 1 via ORAL
  Filled 2017-08-02 (×17): qty 1

## 2017-08-02 MED ORDER — INSULIN GLARGINE 100 UNIT/ML ~~LOC~~ SOLN
10.0000 [IU] | Freq: Every day | SUBCUTANEOUS | Status: DC
  Administered 2017-08-02 – 2017-08-05 (×3): 10 [IU] via SUBCUTANEOUS
  Filled 2017-08-02 (×4): qty 0.1

## 2017-08-02 MED ORDER — INSULIN ASPART 100 UNIT/ML ~~LOC~~ SOLN
3.0000 [IU] | Freq: Three times a day (TID) | SUBCUTANEOUS | Status: DC
  Administered 2017-08-04 – 2017-08-19 (×29): 3 [IU] via SUBCUTANEOUS

## 2017-08-02 MED ORDER — ONDANSETRON HCL 4 MG PO TABS
4.0000 mg | ORAL_TABLET | Freq: Four times a day (QID) | ORAL | Status: DC | PRN
  Administered 2017-08-10: 4 mg via ORAL
  Filled 2017-08-02: qty 1

## 2017-08-02 MED ORDER — SODIUM CHLORIDE 0.9% FLUSH
10.0000 mL | INTRAVENOUS | Status: DC | PRN
  Administered 2017-08-05 – 2017-08-07 (×3): 10 mL
  Administered 2017-08-09: 20 mL
  Filled 2017-08-02 (×4): qty 40

## 2017-08-02 MED ORDER — WARFARIN SODIUM 1 MG PO TABS
1.0000 mg | ORAL_TABLET | Freq: Once | ORAL | Status: AC
  Administered 2017-08-02: 1 mg via ORAL
  Filled 2017-08-02: qty 1

## 2017-08-02 MED ORDER — HEPARIN SODIUM (PORCINE) 1000 UNIT/ML DIALYSIS
40.0000 [IU]/kg | INTRAMUSCULAR | Status: DC | PRN
  Administered 2017-08-03: 2800 [IU] via INTRAVENOUS_CENTRAL

## 2017-08-02 MED ORDER — ENSURE ENLIVE PO LIQD
237.0000 mL | Freq: Two times a day (BID) | ORAL | Status: DC
  Administered 2017-08-07 – 2017-08-10 (×4): 237 mL via ORAL

## 2017-08-02 MED ORDER — WARFARIN SODIUM 2 MG PO TABS: 1.0000 mg | ORAL_TABLET | Freq: Once | ORAL | Status: DC

## 2017-08-02 MED ORDER — CEFAZOLIN SODIUM-DEXTROSE 2-4 GM/100ML-% IV SOLN
2.0000 g | INTRAVENOUS | Status: DC
  Administered 2017-08-02 – 2017-08-18 (×14): 2 g via INTRAVENOUS
  Filled 2017-08-02 (×21): qty 100

## 2017-08-02 MED ORDER — COLCHICINE 0.6 MG PO TABS
0.3000 mg | ORAL_TABLET | Freq: Every day | ORAL | Status: DC
  Administered 2017-08-03 – 2017-08-20 (×18): 0.3 mg via ORAL
  Filled 2017-08-02 (×18): qty 1

## 2017-08-02 MED ORDER — ONDANSETRON HCL 4 MG/2ML IJ SOLN: 4.0000 mg | Freq: Four times a day (QID) | INTRAMUSCULAR | Status: DC | PRN

## 2017-08-02 MED ORDER — HYDROCODONE-ACETAMINOPHEN 7.5-325 MG PO TABS
1.0000 | ORAL_TABLET | ORAL | Status: DC | PRN
  Administered 2017-08-02 (×2): 1 via ORAL
  Administered 2017-08-03 – 2017-08-04 (×3): 2 via ORAL
  Administered 2017-08-04 (×2): 1 via ORAL
  Administered 2017-08-05 – 2017-08-09 (×11): 2 via ORAL
  Administered 2017-08-10: 1 via ORAL
  Administered 2017-08-10 – 2017-08-11 (×5): 2 via ORAL
  Administered 2017-08-12: 1 via ORAL
  Administered 2017-08-12 – 2017-08-20 (×20): 2 via ORAL
  Filled 2017-08-02 (×5): qty 2
  Filled 2017-08-02: qty 1
  Filled 2017-08-02 (×11): qty 2
  Filled 2017-08-02: qty 1
  Filled 2017-08-02 (×7): qty 2
  Filled 2017-08-02: qty 1
  Filled 2017-08-02: qty 2
  Filled 2017-08-02: qty 1
  Filled 2017-08-02 (×5): qty 2
  Filled 2017-08-02: qty 1
  Filled 2017-08-02 (×10): qty 2
  Filled 2017-08-02: qty 1
  Filled 2017-08-02: qty 2

## 2017-08-02 MED ORDER — MIDODRINE HCL 5 MG PO TABS
10.0000 mg | ORAL_TABLET | Freq: Three times a day (TID) | ORAL | Status: DC
  Administered 2017-08-03 – 2017-08-20 (×52): 10 mg via ORAL
  Filled 2017-08-02 (×46): qty 2

## 2017-08-02 MED ORDER — INSULIN ASPART 100 UNIT/ML ~~LOC~~ SOLN
0.0000 [IU] | Freq: Every day | SUBCUTANEOUS | Status: DC
  Administered 2017-08-14: 2 [IU] via SUBCUTANEOUS

## 2017-08-02 MED ORDER — ALUM & MAG HYDROXIDE-SIMETH 200-200-20 MG/5ML PO SUSP: 15.0000 mL | Freq: Four times a day (QID) | ORAL | Status: DC | PRN

## 2017-08-02 MED ORDER — SUCROFERRIC OXYHYDROXIDE 500 MG PO CHEW
500.0000 mg | CHEWABLE_TABLET | Freq: Three times a day (TID) | ORAL | Status: DC
  Administered 2017-08-03 – 2017-08-20 (×45): 500 mg via ORAL
  Filled 2017-08-02 (×54): qty 1

## 2017-08-02 MED ORDER — ACETAMINOPHEN 325 MG PO TABS: 650.0000 mg | ORAL_TABLET | ORAL | Status: DC | PRN

## 2017-08-02 NOTE — Care Management Note (Signed)
Case Management Note Previous Note Completed by Irven Shelling RN, BSN Unit 4E-Case Manager-- [place] coverage [phone]  Patient Details  Name: [name] MRN: [id number] Date of Birth: [birth year]  Subjective/Objective:  Pt admitted with HF                  Action/Plan: PTA pt lived at home with wife- independent, has PCP and insurance with drug coverage-no difficulty affording medications.  per last CM note- referral was done with [facility] for emmi f/u calls. CM to follow for d/c needs  Expected Discharge Date:  08/02/17               Expected Discharge Plan:     In-House Referral:     Discharge planning Services     Post Acute Care Choice:    Choice offered to:     DME Arranged:    DME Agency:     HH Arranged:    HH Agency:     Status of Service:     If discussed at [employer] of Stay Meetings, dates discussed:    Discharge Disposition:   Additional Comments: 08/02/2017  Pt to discharge to [facility] today   07/29/17 Update:  Per HF team pt will go to IHD tomorrow - if pt does not tolerate [place] discussions will begin.  CIR/SNF continuing to follow  Discussed in LOS 07/28/17 - pt remains appropriate for continued stay.  CIR following and SNF.  Pt remains volume overloaded with IHD assessed daily.  Pt has not been deemed long term HD as of yet. Maryclare Labrador, RN 08/02/2017, 11:20 AM

## 2017-08-02 NOTE — Progress Notes (Signed)
Rehab admissions - I met with patient and his wife this am.  We discussed rehab venues.  Wife and patient now in agreement to inpatient rehab admission.  We do have a bed available today and will admit to acute inpatient rehab today.  He has been cleared by attending, renal MD and heart failure team.  Call me for questions.  #[phone]

## 2017-08-02 NOTE — H&P (Signed)
Physical Medicine and Rehabilitation Admission H&P     Chief Complaint  Patient presents with  . Shortness of Breath  . Leg Swelling  :  HPI: [name] is a 61 y.o. 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 Per chart review patient lives with spouse independent prior to admission and still driving. One level home one-step entry. Presented 07/10/2017 with increasing bilateral lower extremity edema and increasing shortness of breath. Patient recently seen in urgent care 2 weeks ago for right groin pain he did receive Flexeril was given a prescription for Bactrim which she did not take. Chest x-ray showed right pleural effusion and right lung base atelectasis versus infiltrate. Unilateral films of the pelvis showed no fracture. CT abdomen and pelvis showed cirrhosis moderate ascites. Echocardiogram with ejection fraction of 01-02% systolic function severely reduced. Blood cultures MSSA placed on Intravenous cefazolin 6 weeks until 08/21/2017 then convert to chronic suppressive oral cephalexin therapy. TEE completed showing no evidence of endocarditis and no plans to extract ICD device. Renal service follow-up for severe CKD stage IV initially requiring CRRT and latest creatinine 3.45 and presently on dialysis. Patient remains on chronic Coumadin therapy. Therapy evaluations completed an ongoing patient limited by fatigue. Request made for physical medicine rehabilitation consult.Patient was admitted for a comprehensive rehabilitation program  Review of Systems  Constitutional: Negative for fever.  HENT: Negative for hearing loss.  Eyes: Negative for blurred vision and double vision.   Respiratory: Positive for shortness of breath.  Cardiovascular: Positive for palpitations and leg swelling. Negative for chest pain.  Gastrointestinal: Positive for constipation. Negative for nausea.  GERD  Genitourinary: Negative for hematuria.  Musculoskeletal: Positive for joint pain and myalgias.  Skin: Negative for rash.  Neurological: Positive for dizziness and weakness. Negative for seizures.  All other systems reviewed and are negative.       Past Medical History:  Diagnosis Date  . Adenocarcinoma of rectum ([place])    a. 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 AICD (automatic cardioverter/defibrillator) present 2002   BI V ICD  . Anemia   . CAD (coronary artery disease)    a. BMS to LAD 2001 at [facility] b. PTCA/atherectomy ramus and BMS to LAD 2009  . CHF (congestive heart failure) ([place])   . Cholelithiasis 06/2015  . Chronic kidney disease, stage IV (severe) ([place])   . Chronic systolic heart failure ([place])   . Cirrhosis ([place])   . Colostomy in place [facility])   . Dizziness    a. chronic. Admission for this 07/18/2014  . DM type 2, uncontrolled, with renal complications ([place])   . Dysrhythmia   . Essential hypertension, benign   . GERD (gastroesophageal reflux disease)   . HCAP (healthcare-associated pneumonia) 07/21/2015  . Hematuria   . History of blood transfusion    "I've had 2 units so far this year" (09/27/2015)  . HLD (hyperlipidemia)   . Ischemic cardiomyopathy    EF 18% by nuclear study 2016, multiple myocardial infarctions in past   . Myocardial infarction ([place]) 2001  . Obesity   . Orthostatic hypotension   . OSA on CPAP   . Paroxysmal atrial fibrillation ([facility])    a. on amiodarone, digoxin and Eliquis  . PONV (postoperative nausea and vomiting)   .  Prostate cancer ([place])    a. s/p seed implants with chemo and radiation  . SAH (subarachnoid hemorrhage) ([place])    post-traumatic (fall) [facility] 12/2014  . TIA (transient ischemic attack)         Past Surgical History:  Procedure Laterality Date   . Abdominal and Perineal Resection of Rectum with Total Mesorectal Excision  10/04/2007  . AV FISTULA PLACEMENT Right 09/16/2015   Procedure: ARTERIOVENOUS (AV) FISTULA CREATION - BRACHIOCEPHALIC; Surgeon: Elam Dutch, MD; Location: [place]; Service: Vascular; Laterality: Right;  . BI-VENTRICULAR IMPLANTABLE CARDIOVERTER DEFIBRILLATOR (CRT-D)  2009  . CARDIAC CATHETERIZATION  08/2001; 2009   ; [name] 07/10/2013  . CARDIAC DEFIBRILLATOR PLACEMENT  2002  . CARDIAC DEFIBRILLATOR PLACEMENT  2009   Upgraded to a BiV ICD  . COLONOSCOPY  09/14/2011   Dr. Gala Romney: via colostomy, Single pedunculated benign inflammatory polyp. Due for surveillance Oct 2015  . COLONOSCOPY N/A 07/02/2014   Procedure: COLONOSCOPY; Surgeon: Daneil Dolin, MD; [facility]; Service: Endoscopy; Laterality: N/A; 7:30 / COLONOSCOPY THRU COLOSTOMY  . COLONOSCOPY N/A 12/11/2014   Dr. Gala Romney via colostomy. Normal. Repeat in 2021.   Marland Kitchen COLONOSCOPY N/A 08/24/2015   Dr. Havery Moros: diminutive polyp not removed, stoma site of bleeding  . COLOSTOMY  2008  . CORONARY ANGIOPLASTY WITH STENT PLACEMENT  2001; ~ 2006   "1 + 1"   . ELECTROPHYSIOLOGIC STUDY N/A 08/28/2015   Procedure: AV Node Ablation; Surgeon: Will Meredith Leeds, MD; Location: [place] CV LAB; Service: Cardiovascular; Laterality: N/A;  . EP IMPLANTABLE DEVICE N/A 04/10/2015   Procedure: Ppm/Biv Ppm Generator Changeout; Surgeon: Evans Lance, MD; Location: [place] INVASIVE CV LAB CUPID; Service: Cardiovascular; Laterality: N/A;  . ERCP N/A 06/11/2016   Procedure: ENDOSCOPIC RETROGRADE CHOLANGIOPANCREATOGRAPHY (ERCP); Surgeon: Teena Irani, MD; Location: [facility] ENDOSCOPY; Service: Endoscopy; Laterality: N/A;  . ERCP N/A 01/26/2017   Procedure: ENDOSCOPIC RETROGRADE CHOLANGIOPANCREATOGRAPHY (ERCP); Surgeon: Teena Irani, MD; Location: [facility] ENDOSCOPY; Service: Endoscopy; Laterality: N/A;  . ERCP  06/2016   Dr. Amedeo Plenty: duodenal fistula, dilated bile duct but no obvious stones, s/p  sphincterotomy and stent placement  . ERCP  01/2017   Dr. Amedeo Plenty: CBD stones, s/p sphincterotomy, balloon extraction, stent removal  . ESOPHAGOGASTRODUODENOSCOPY N/A 07/02/2014   Procedure: ESOPHAGOGASTRODUODENOSCOPY (EGD); Surgeon: Daneil Dolin, MD; [facility]; Service: Endoscopy; Laterality: N/A; 7:30  . ESOPHAGOGASTRODUODENOSCOPY N/A 12/11/2014   [id number] EGD  . ESOPHAGOGASTRODUODENOSCOPY (EGD) WITH PROPOFOL N/A 04/18/2017   Procedure: ESOPHAGOGASTRODUODENOSCOPY (EGD) WITH PROPOFOL; Surgeon: Daneil Dolin, MD; [facility]; Service: Endoscopy; Laterality: N/A; 2:30pm  . GASTROINTESTINAL STENT REMOVAL N/A 01/26/2017   Procedure: GASTROINTESTINAL STENT REMOVAL; Surgeon: Teena Irani, MD; Location: [place]; Service: Endoscopy; Laterality: N/A;  . GIVENS CAPSULE STUDY N/A 07/23/2015   Dr. Michail Sermon: small non-bleeding AVM, mild gastritis  . LEFT HEART CATHETERIZATION WITH CORONARY ANGIOGRAM N/A 07/13/2013   Procedure: LEFT HEART CATHETERIZATION WITH CORONARY ANGIOGRAM; Surgeon: Lorretta Harp, MD; Location: [facility] CATH LAB; Service: Cardiovascular; Laterality: N/A;  . Venia Minks DILATION N/A 07/02/2014   Procedure: Venia Minks DILATION; Surgeon: Daneil Dolin, MD; [facility]; Service: Endoscopy; Laterality: N/A; 7:30  . MALONEY DILATION N/A 04/18/2017   Procedure: Venia Minks DILATION; Surgeon: Daneil Dolin, MD; [facility]; Service: Endoscopy; Laterality: N/A;  . PORTACATH PLACEMENT  06/2007   "removed ~ 1 yr later"  . RIGHT HEART CATHETERIZATION N/A 02/24/2015   Procedure: RIGHT HEART CATH; Surgeon: Jolaine Artist, MD; Location: [facility] CATH LAB; Service: Cardiovascular; Laterality: N/A;  .  SAVORY DILATION N/A 07/02/2014   Procedure: SAVORY DILATION; Surgeon: Daneil Dolin, MD; [facility]; Service: Endoscopy; Laterality: N/A; 7:30        Family History  Problem Relation Age of Onset  . Colon cancer Mother 22  . Coronary artery disease  Father   . Colon cancer Sister 72  . Diabetes Sister   . Colon cancer Other    2 cousins, succumbed to illness   Social History: reports that he has never smoked. He has never used smokeless tobacco. He reports that he does not drink alcohol or use drugs.  Allergies: No Known Allergies        Medications Prior to Admission  Medication Sig Dispense Refill  . atorvastatin (LIPITOR) 40 MG tablet Take 1 tablet (40 mg total) by mouth daily. (Patient taking differently: Take 40 mg by mouth at bedtime. ) 30 tablet 6  . Coenzyme Q10 (COQ10) 50 MG CAPS Take 1 capsule by mouth daily.    . collagenase (SANTYL) ointment Apply topically daily. (Patient taking differently: Apply topically every other day. At night) 15 g 0  . ferrous sulfate 325 (65 FE) MG tablet Take 1 tablet (325 mg total) by mouth 3 (three) times daily with meals. 90 tablet 0  . glucose blood test strip Use to test blood sugar 3 times daily 100 each 4  . insulin aspart (NOVOLOG FLEXPEN) 100 UNIT/ML FlexPen 5 units before each meal (Patient taking differently: Inject 6 Units into the skin 3 (three) times daily with meals. 5 units before each meal) 15 mL 1  . metolazone (ZAROXOLYN) 2.5 MG tablet take 1 tablet by mouth weekly if needed for SWELLING 12 tablet 3  . metoprolol succinate (TOPROL-XL) 25 MG 24 hr tablet Take 0.5 tablets (12.5 mg total) by mouth 2 (two) times daily. 45 tablet 3  . nitroGLYCERIN (NITROSTAT) 0.4 MG SL tablet Place 1 tablet (0.4 mg total) under the tongue every 5 (five) minutes as needed for chest pain. 25 tablet 3  . pantoprazole (PROTONIX) 40 MG tablet Take 1 tablet (40 mg total) by mouth daily. 30 tablet 0  . polyethylene glycol (MIRALAX / GLYCOLAX) packet Take 17 g by mouth daily as needed for mild constipation. 14 each 0  . potassium chloride SA (K-DUR,KLOR-CON) 20 MEQ tablet Take 2 tablets (40 mEq total) by mouth daily. (Patient taking differently: Take 20-40 mEq by mouth daily. Only takes 2 tablets when taking  metolazone) 60 tablet 6  . PROAIR RESPICLICK 315 (90 BASE) MCG/ACT AEPB Inhale 1 puff into the lungs every 6 (six) hours as needed (for breathing).   0  . torsemide (DEMADEX) 20 MG tablet TAKE 4 TABLETS IN THE MORNING AND 2 TABLET IN THE EVENING 180 tablet 6  . warfarin (COUMADIN) 2 MG tablet Take 1 tablet daily except 1/2 tablet on Tuesdays and Fridays (Patient taking differently: Take 1 tablet daily except 1/2 tablet on Tuesdays and Thursdays) 30 tablet 3  . Insulin Glargine (LANTUS SOLOSTAR) 100 UNIT/ML Solostar Pen Inject 15 Units into the skin daily at 10 pm. (Patient not taking: Reported on 06/13/2017) 5 pen 3   Home:  Home Living  Family/patient expects to be discharged to:: Private residence  Living Arrangements: Spouse/significant other  Available Help at Discharge: Family, Available PRN/intermittently  Type of Home: House  Home Access: Stairs to enter  [employer] of Steps: 1  Home Layout: One level  Bathroom Shower/Tub: Public librarian, Tourist information centre manager: Standard  Home Equipment:  Walker - 4 wheels, Walker - standard, Tub bench (may be able to find more of "Mom's" equipment)  Functional History:  Prior Function  Level of Independence: Independent  Functional Status:  Mobility:  Bed Mobility  Overal bed mobility: Needs Assistance  Bed Mobility: Sit to Supine, Supine to Sit  Rolling: Min assist  Sidelying to sit: Mod assist  Supine to sit: Mod assist  Sit to supine: +2 for physical assistance, Mod assist  General bed mobility comments: Initially assist to come to EOB needing assist to elevate trunk and for LE assist. Also assist to lower trunk and to lift LEs onto bed  Transfers  Overall transfer level: Needs assistance  Equipment used: 2 person hand held assist, Ambulation equipment used  Transfer via Lift Equipment: Stedy  Transfers: Sit to/from Stand  Sit to Stand: Mod assist, Min assist, +2 safety/equipment, From elevated surface  Stand pivot  transfers: Min assist, +2 safety/equipment  Lateral/Scoot Transfers: Max assist, +2 physical assistance  General transfer comment: Pt reports signficant Rt hip pain but willing to try to stand to [facility]. Pt stood to [facility] 6 times - 1 min, 1 1/2 min, 30 seconds, 20 seconds, 1 min 20 seconds and 30 seconds. MD came in when pt standing and MD was made aware of pts right hip pain liimiting his mobility recently and MD to make ORtho consult. Pt did tolerate standing well with STedy. Did place a pillow at knees for comfort.  Ambulation/Gait  Ambulation/Gait assistance: Mod assist, +2 safety/equipment  Ambulation Distance (Feet): (69ft total with seated rest break)  Assistive device: (EVA walker)  Gait Pattern/deviations: Step-through pattern, Decreased step length - right, Decreased step length - left, Drifts right/left, Trunk flexed, Narrow base of support  General Gait Details: cues for posture; pt with increased trunk flexion with fatigue; assist for balance and management of AD; VSS   ADL:  ADL  Overall ADL's : Needs assistance/impaired  Eating/Feeding: Bed level, Set up  Grooming: Wash/dry hands, Wash/dry face, Minimal assistance, Brushing hair, Sitting  Upper Body Bathing: Maximal assistance, Bed level, Sitting  Lower Body Bathing: Total assistance, Sit to/from stand  Upper Body Dressing : Sitting, Total assistance  Lower Body Dressing: Total assistance, Sit to/from stand  Toilet Transfer: Moderate assistance, +2 for physical assistance, BSC  Toileting- Clothing Manipulation and Hygiene: Total assistance, Sit to/from stand  Functional mobility during ADLs: Moderate assistance, +2 for physical assistance, +2 for safety/equipment  Cognition:  Cognition  Overall Cognitive Status: Within Functional Limits for tasks assessed  Orientation Level: Oriented X4  Cognition  Arousal/Alertness: Awake/alert  Behavior During Therapy: WFL for tasks assessed/performed  Overall Cognitive Status: Within  Functional Limits for tasks assessed  Area of Impairment: Following commands, Problem solving, Awareness  Following Commands: Follows one step commands consistently  Awareness: Emergent  Problem Solving: Slow processing  General Comments: WFL for basic info  Physical Exam:  Blood pressure (!) 89/54, pulse 77, temperature 97.9 F (36.6 C), temperature source Oral, resp. rate 12, height 5\' 10"  (1.778 m), weight 82.1 kg (181 lb), SpO2 95 %.  Physical Exam  Vitals reviewed.  Constitutional: No distress.  HENT:  Head: Normocephalic and atraumatic.  Eyes: EOM are normal.  Neck: Normal range of motion. Neck supple. No tracheal deviation present. No thyromegaly present.  Cardiovascular: Exam reveals no gallop and no friction rub.  No murmur heard. Cardiac rate controlled  Respiratory: Effort normal and breath sounds normal. No respiratory distress. He has no wheezes.  GI: Soft. Bowel  sounds are normal. He exhibits no distension.  Colostomy in place  Skin:  Bilateral lower extremity wounds with ischemic changes and dry dressing in place. No odor.  Neurological:  Mood is flat but appropriate. Difficult to arouse. Oriented to person place and time. UE, bilaterally 3/5 deltoid, 3+ biceps, triceps, 4 wrist ext, HI. LLE: 3-/5 HF and KE and 4/5 ADF/PF. [name]: 2/5 HF, KE and 2-3/5 ADF/PF. Decreased LT inconsistent along right thigh.  Psych: flat, cooperative  Lab Results Last 48 Hours  Imaging Results (Last 48 hours)     Medical Problem List and Plan:  1. Severe debilitation/encephalopathy secondary to respiratory failure, CHF, MSSA sepsis  -admit to inpatient rehab  2. DVT Prophylaxis/Anticoagulation: Chronic Coumadin therapy and monitor for rebleeding episodes  3. Pain Management: Hydrocodone as needed  4. Mood: Provide emotional support  5. Neuropsych: This patient is capable of making decisions on his own behalf.  6. Skin/Wound Care/lower extremity leg wound: Routine skin checks with skin care as directed  -ostomy currently sealed/functioning  7. Fluids/Electrolytes/Nutrition: Routine I&O with follow-up chemistries on admit  8. CKD stage IV. Followed closely by renal services.  9. MSSA bacteremia. Plan intravenous cefazolin through 08/21/2017 then chronic Keflex and follow-up per infectious disease  10. 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 Routine colostomy care  11. CAD with implantable defibrillator. Follow-up cardiology services. No plan to extract device  12. Chronic systolic congestive heart failure/ischemic cardiomyopathy. Monitor for any signs of fluid overload.  -daily weights and I's and O's  13. Diabetes mellitus and peripheral neuropathy. Lantus insulin 10 units daily at bedtime.  14. Orthostasis. Continue ProAmatine 10 mg 3 times a day. Monitor orthostatic vs with activity.  15. Acute on chronic anemia. Continue Aranesp.  16. History of gout. Colchicine 0.3 mg daily. Monitor for any gout flareups  Post Admission Physician Evaluation:   1. Functional deficits secondary to debility/encephalopathy. 2. Patient is admitted to receive collaborative, interdisciplinary care between the physiatrist, rehab nursing staff, and therapy team. 3. Patient's level of medical complexity and substantial therapy needs in context of that medical necessity cannot be provided at a lesser intensity of care such as a SNF. 4. Patient has experienced substantial functional loss from his/her baseline which was documented above under the "Functional History" and "Functional Status" headings. Judging by the patient's diagnosis, physical exam, and functional history, the patient has potential for functional progress which will result in measurable gains while on inpatient rehab. These gains will be of substantial and practical use upon discharge in facilitating mobility and self-care at the household level. 5. Physiatrist will provide 24 hour management of medical needs as well as oversight of the therapy plan/treatment and provide guidance as appropriate regarding the interaction of the two. 6. The Preadmission Screening has been reviewed and patient status is unchanged unless otherwise stated above. 7. 24 hour rehab nursing will assist with bladder management, bowel management, safety, skin/wound care, disease management, medication administration, pain management and patient education and help integrate therapy concepts, techniques,education, etc. 8. PT will assess and treat for/with: Lower extremity strength, range of motion, stamina, balance, functional mobility, safety, adaptive techniques and equipment, NMR, activity tolerance, family education. Goals are: supervision to min assist. 9. OT will assess and treat for/with: ADL's, functional mobility, safety, upper extremity strength, adaptive techniques and equipment, NMR, wound mgt, family ed, ego support. Goals are: supervision to min assist. Therapy may proceed with showering this patient. 10. SLP: consider  assessment/screen based on clinical appearance on rehab.  11. Case Management and Social Worker will assess and treat for psychological issues and discharge planning. 12. Team conference will be held weekly to assess progress toward goals and to determine barriers to discharge. 13. Patient will receive at least 3 hours of therapy per day at least 5 days per week. 14. ELOS: 18-25  15. Prognosis: excellent Meredith Staggers, MD, [place] [facility]  08/02/2017  Cathlyn Parsons., PA-C  07/27/2017

## 2017-08-02 NOTE — Progress Notes (Signed)
Meredith Staggers, MD Physician Signed Physical Medicine and Rehabilitation  Consult Note Date of Service: 07/26/2017 11:33 AM  Related encounter: ED to Hosp-Admission (Current) from 07/09/2017 in [place] [facility]     Expand All Collapse All   [] Hide copied text [] Hover for attribution information      Physical Medicine and Rehabilitation Consult Reason for Consult: Decreased functional mobility Referring Physician: [facility]   HPI: [name] is a 61 y.o. 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 Per chart review patient lives with spouse independent prior to admission and still driving. One level home one-step entry. Presented 07/10/2017 with increasing bilateral lower extremity edema and increasing shortness of breath. Patient recently seen in urgent care 2 weeks ago for right groin pain he did receive Flexeril was given a prescription for Bactrim which she did not take. Chest x-ray showed right pleural effusion and right lung base atelectasis versus infiltrate. Unilateral films of the pelvis showed no fracture. CT abdomen and pelvis showed cirrhosis moderate ascites. Echocardiogram with ejection fraction of 93-71% systolic function severely reduced. Blood cultures MSSA placed on broad-spectrum antibiotics. TEE completing showing no evidence of endocarditis and no plans to extract ICD device. Renal service follow-up for severe CKD stage IV requiring CRRT initiated 07/11/2017. Patient remains on chronic Coumadin therapy. Therapy evaluations completed an ongoing patient limited by fatigue. Request made for physical medicine rehabilitation consult.   Review of Systems  Constitutional: Negative for chills and  fever.  HENT: Negative for hearing loss.   Eyes: Negative for blurred vision and double vision.  Respiratory: Positive for shortness of breath.   Cardiovascular: Positive for palpitations and leg swelling.  Gastrointestinal: Positive for constipation. Negative for nausea.       GERD  Musculoskeletal: Positive for joint pain and myalgias.  Skin: Negative for rash.  Neurological: Positive for dizziness. Negative for seizures.  All other systems reviewed and are negative.      Past Medical History:  Diagnosis Date  . Adenocarcinoma of rectum ([place])    a. 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 AICD (automatic cardioverter/defibrillator) present 2002   BI V ICD  . Anemia   . CAD (coronary artery disease)    a. BMS to LAD 2001 at [facility] b. PTCA/atherectomy ramus and BMS to LAD 2009  . CHF (congestive heart failure) ([place])   . Cholelithiasis 06/2015  . Chronic kidney disease, stage IV (severe) ([place])   . Chronic systolic heart failure ([place])   . Cirrhosis ([place])   . Colostomy in place [facility])   . Dizziness    a. chronic. Admission for this 07/18/2014  . DM type 2, uncontrolled, with renal complications ([place])   . Dysrhythmia   . Essential hypertension, benign   . GERD (gastroesophageal reflux disease)   . HCAP (healthcare-associated pneumonia) 07/21/2015  . Hematuria   . History of blood transfusion    "I've had 2 units so far this year" (09/27/2015)  . HLD (hyperlipidemia)   . Ischemic cardiomyopathy    EF 18% by nuclear study 2016, multiple myocardial infarctions in past    . Myocardial infarction ([place]) 2001  . Obesity   . Orthostatic hypotension   . OSA on CPAP   . Paroxysmal atrial fibrillation ([facility])    a. on amiodarone, digoxin and Eliquis  .  PONV (postoperative nausea and vomiting)   . Prostate cancer ([place])    a. s/p seed implants with chemo and radiation  . SAH (subarachnoid hemorrhage) ([place])    post-traumatic (fall) [facility] 12/2014  . TIA (transient ischemic attack)           Past Surgical History:  Procedure Laterality Date  . Abdominal and Perineal Resection of Rectum with Total Mesorectal Excision  10/04/2007  . AV FISTULA PLACEMENT Right 09/16/2015   Procedure: ARTERIOVENOUS (AV) FISTULA CREATION - BRACHIOCEPHALIC;  Surgeon: Elam Dutch, MD;  Location: [place];  Service: Vascular;  Laterality: Right;  . BI-VENTRICULAR IMPLANTABLE CARDIOVERTER DEFIBRILLATOR  (CRT-D)  2009  . CARDIAC CATHETERIZATION  08/2001; 2009   ; [name] 07/10/2013  . CARDIAC DEFIBRILLATOR PLACEMENT  2002  . CARDIAC DEFIBRILLATOR PLACEMENT  2009   Upgraded to a BiV ICD  . COLONOSCOPY  09/14/2011   Dr. Gala Romney: via colostomy, Single pedunculated benign inflammatory polyp. Due for surveillance Oct 2015  . COLONOSCOPY N/A 07/02/2014   Procedure: COLONOSCOPY;  Surgeon: Daneil Dolin, MD;  [facility];  Service: Endoscopy;  Laterality: N/A;  7:30 / COLONOSCOPY THRU COLOSTOMY  . COLONOSCOPY N/A 12/11/2014   Dr. Gala Romney via colostomy. Normal. Repeat in 2021.   Marland Kitchen COLONOSCOPY N/A 08/24/2015   Dr. Havery Moros: diminutive polyp not removed, stoma site of bleeding  . COLOSTOMY  2008  . CORONARY ANGIOPLASTY WITH STENT PLACEMENT  2001; ~ 2006   "1 + 1"   . ELECTROPHYSIOLOGIC STUDY N/A 08/28/2015   Procedure: AV Node Ablation;  Surgeon: Will Meredith Leeds, MD;  Location: [place] [facility];  Service: Cardiovascular;  Laterality: N/A;  . EP IMPLANTABLE DEVICE N/A 04/10/2015   Procedure: Ppm/Biv Ppm Generator Changeout;  Surgeon: Evans Lance, MD;  Location: [facility] CUP[facility];  Service: Cardiovascular;  Laterality: N/A;  . ERCP N/A 06/11/2016   Procedure: ENDOSCOPIC RETROGRADE CHOLANGIOPANCREATOGRAPHY (ERCP);  Surgeon: Teena Irani, MD;  Location: [facility] ENDOSCOPY;  Service: Endoscopy;  Laterality: N/A;  . ERCP N/A 01/26/2017   Procedure: ENDOSCOPIC RETROGRADE CHOLANGIOPANCREATOGRAPHY (ERCP);  Surgeon: Teena Irani, MD;  Location: [facility] ENDOSCOPY;  Service: Endoscopy;   Laterality: N/A;  . ERCP  06/2016   Dr. Amedeo Plenty: duodenal fistula, dilated bile duct but no obvious stones, s/p sphincterotomy and stent placement  . ERCP  01/2017   Dr. Amedeo Plenty: CBD stones, s/p sphincterotomy, balloon extraction, stent removal  . ESOPHAGOGASTRODUODENOSCOPY N/A 07/02/2014   Procedure: ESOPHAGOGASTRODUODENOSCOPY (EGD);  Surgeon: Daneil Dolin, MD;  [facility];  Service: Endoscopy;  Laterality: N/A;  7:30  . ESOPHAGOGASTRODUODENOSCOPY N/A 12/11/2014   MWN:[id number] EGD  . ESOPHAGOGASTRODUODENOSCOPY (EGD) WITH PROPOFOL N/A 04/18/2017   Procedure: ESOPHAGOGASTRODUODENOSCOPY (EGD) WITH PROPOFOL;  Surgeon: Daneil Dolin, MD;  [facility];  Service: Endoscopy;  Laterality: N/A;  2:30pm  . GASTROINTESTINAL STENT REMOVAL N/A 01/26/2017   Procedure: GASTROINTESTINAL STENT REMOVAL;  Surgeon: Teena Irani, MD;  Location: [place];  Service: Endoscopy;  Laterality: N/A;  . GIVENS CAPSULE STUDY N/A 07/23/2015   Dr. Michail Sermon: small non-bleeding AVM, mild gastritis  . LEFT HEART CATHETERIZATION WITH CORONARY ANGIOGRAM N/A 07/13/2013   Procedure: LEFT HEART CATHETERIZATION WITH CORONARY ANGIOGRAM;  Surgeon: Lorretta Harp, MD;  Location: [facility] CATH LAB;  Service: Cardiovascular;  Laterality: N/A;  . Venia Minks DILATION N/A 07/02/2014   Procedure: Venia Minks DILATION;  Surgeon: Daneil Dolin, MD;  [facility];  Service: Endoscopy;  Laterality: N/A;  7:30  . MALONEY DILATION N/A  04/18/2017   Procedure: Venia Minks DILATION;  Surgeon: Daneil Dolin, MD;  [facility];  Service: Endoscopy;  Laterality: N/A;  . PORTACATH PLACEMENT  06/2007   "removed ~ 1 yr later"  . RIGHT HEART CATHETERIZATION N/A 02/24/2015   Procedure: RIGHT HEART CATH;  Surgeon: Jolaine Artist, MD;  Location[facility] CATH LAB;  Service: Cardiovascular;  Laterality: N/A;  . SAVORY DILATION N/A 07/02/2014   Procedure: SAVORY DILATION;  Surgeon: Daneil Dolin, MD;  [facility];  Service: Endoscopy;  Laterality: N/A;  7:30        Family History  Problem Relation Age of Onset  . Colon cancer Mother 49  . Coronary artery disease Father   . Colon cancer Sister 30  . Diabetes Sister   . Colon cancer Other        2 cousins, succumbed to illness   Social History:  reports that he has never smoked. He has never used smokeless tobacco. He reports that he does not drink alcohol or use drugs. Allergies: No Known Allergies       Medications Prior to Admission  Medication Sig Dispense Refill  . atorvastatin (LIPITOR) 40 MG tablet Take 1 tablet (40 mg total) by mouth daily. (Patient taking differently: Take 40 mg by mouth at bedtime. ) 30 tablet 6  . Coenzyme Q10 (COQ10) 50 MG CAPS Take 1 capsule by mouth daily.    . collagenase (SANTYL) ointment Apply topically daily. (Patient taking differently: Apply topically every other day. At night) 15 g 0  . ferrous sulfate 325 (65 FE) MG tablet Take 1 tablet (325 mg total) by mouth 3 (three) times daily with meals. 90 tablet 0  . glucose blood test strip Use to test blood sugar 3 times daily 100 each 4  . insulin aspart (NOVOLOG FLEXPEN) 100 UNIT/ML FlexPen 5 units before each meal (Patient taking differently: Inject 6 Units into the skin 3 (three) times daily with meals. 5 units before each meal) 15 mL 1  . metolazone (ZAROXOLYN) 2.5 MG tablet take 1 tablet by mouth weekly if needed for SWELLING 12 tablet 3  . metoprolol succinate (TOPROL-XL) 25 MG 24 hr tablet Take 0.5 tablets (12.5 mg total) by mouth 2 (two) times daily. 45 tablet 3  . nitroGLYCERIN (NITROSTAT) 0.4 MG SL tablet Place 1 tablet (0.4 mg total) under the tongue every 5 (five) minutes as needed for chest pain. 25 tablet 3  . pantoprazole (PROTONIX) 40 MG tablet Take 1 tablet (40 mg total) by mouth daily. 30 tablet 0  . polyethylene glycol (MIRALAX / GLYCOLAX) packet Take 17 g by mouth daily as needed for mild constipation. 14 each 0  . potassium chloride  SA (K-DUR,KLOR-CON) 20 MEQ tablet Take 2 tablets (40 mEq total) by mouth daily. (Patient taking differently: Take 20-40 mEq by mouth daily. Only takes 2 tablets when taking metolazone) 60 tablet 6  . PROAIR RESPICLICK 818 (90 BASE) MCG/ACT AEPB Inhale 1 puff into the lungs every 6 (six) hours as needed (for breathing).   0  . torsemide (DEMADEX) 20 MG tablet TAKE 4 TABLETS IN THE MORNING AND 2 TABLET IN THE EVENING 180 tablet 6  . warfarin (COUMADIN) 2 MG tablet Take 1 tablet daily except 1/2 tablet on Tuesdays and Fridays (Patient taking differently: Take 1 tablet daily except 1/2 tablet on Tuesdays and Thursdays) 30 tablet 3  . Insulin Glargine (LANTUS SOLOSTAR) 100 UNIT/ML Solostar Pen Inject 15 Units into the skin daily  at 10 pm. (Patient not taking: Reported on 06/13/2017) 5 pen 3    Home: Home Living Family/patient expects to be discharged to:: Private residence Living Arrangements: Spouse/significant other Available Help at Discharge: Family, Available PRN/intermittently Type of Home: House Home Access: Stairs to enter Technical brewer of Steps: 1 Home Layout: One level Bathroom Shower/Tub: Tub/shower unit, Multimedia programmer: Standard Home Equipment: Environmental consultant - 4 wheels, Walker - standard, Tub bench (may be able to find more of "Mom's" equipment)  Functional History: Prior Function Level of Independence: Independent Functional Status:  Mobility: Bed Mobility Overal bed mobility: Needs Assistance Bed Mobility: Sit to Supine Rolling: Min assist Sidelying to sit: Mod assist Supine to sit: Mod assist Sit to supine: Max assist, +2 for physical assistance General bed mobility comments: assist to lower trunk and to lift LEs onto bed  Transfers Overall transfer level: Needs assistance Equipment used: 2 person hand held assist Transfers: Lateral/Scoot Transfers Sit to Stand: Mod assist, +2 physical assistance Stand pivot transfers: Min assist, +2  safety/equipment  Lateral/Scoot Transfers: Max assist, +2 physical assistance General transfer comment: Pt reports signficant Rt hip pain, unable to stand (has been sitting in recliner since this am, and requesting to transfer back to bed.  Attempted squat pivot transfer to Rt, however, pt unable to flex hips adequately to shift weight anteriorly for safe transfer due to pain - able to achieve x 1, but unable to shift weight to the Rt.  Performed scoot transfer using bed pads.  he was able to assist minimally  Ambulation/Gait Ambulation/Gait assistance: Mod assist, +2 safety/equipment Ambulation Distance (Feet):  (34ft total with seated rest break) Assistive device:  (EVA walker) Gait Pattern/deviations: Step-through pattern, Decreased step length - right, Decreased step length - left, Drifts right/left, Trunk flexed, Narrow base of support General Gait Details: cues for posture; pt with increased trunk flexion with fatigue; assist for balance and management of AD; VSS  ADL: ADL Overall ADL's : Needs assistance/impaired Eating/Feeding: Bed level, Set up Grooming: Wash/dry hands, Wash/dry face, Minimal assistance, Brushing hair, Sitting Upper Body Bathing: Maximal assistance, Bed level, Sitting Lower Body Bathing: Total assistance, Sit to/from stand Upper Body Dressing : Sitting, Total assistance Lower Body Dressing: Total assistance, Sit to/from stand Toilet Transfer: Moderate assistance, +2 for physical assistance, BSC Toileting- Clothing Manipulation and Hygiene: Total assistance, Sit to/from stand Functional mobility during ADLs: Moderate assistance, +2 for physical assistance, +2 for safety/equipment  Cognition: Cognition Overall Cognitive Status: Within Functional Limits for tasks assessed Orientation Level: (P) Oriented X4 Cognition Arousal/Alertness: Awake/alert Behavior During Therapy: WFL for tasks assessed/performed Overall Cognitive Status: Within Functional Limits for  tasks assessed Area of Impairment: Following commands, Problem solving, Awareness Following Commands: Follows one step commands consistently Awareness: Emergent Problem Solving: Slow processing General Comments: WFL for basic info   Blood pressure (!) 92/57, pulse 76, temperature (!) 97.4 F (36.3 C), temperature source Oral, resp. rate 13, height 5\' 10"  (1.778 m), weight 82.5 kg (181 lb 14.1 oz), SpO2 100 %. Physical Exam  Vitals reviewed. HENT:  Head: Normocephalic.  Eyes: EOM are normal.  Neck: Normal range of motion. Neck supple. No thyromegaly present.  Cardiovascular:  Cardiac rate controlled  Respiratory:  Decreased breath sounds at the bases  GI: Soft. Bowel sounds are normal. He exhibits no distension.  Neurological:  Mood is flat but appropriate. Oriented to person place and time. UE 3+ prox to 4/5 distally. LLE: 3-HF and KE and 4/5 ADF/PF. [name]: 1+ HF, KE and 2  ADF/PF. Decreased LT along right anterolateral thigh.   Skin: Skin is warm.  Multiple ecchymoses  Psychiatric:  flat       Assessment/Plan: Diagnosis: Severe debility related to respirator failure, CHF, MSSA sepsis 1. Does the need for close, 24 hr/day medical supervision in concert with the patient's rehab needs make it unreasonable for this patient to be served in a less intensive setting? Yes 2. Co-Morbidities requiring supervision/potential complications: ICM, CKD4, afib, ACD 3. Due to bladder management, bowel management, safety, skin/wound care, disease management, medication administration, pain management and patient education, does the patient require 24 hr/day rehab nursing? Yes 4. Does the patient require coordinated care of a physician, rehab nurse, PT (1-2 hrs/day, 5 days/week), OT (1-2 hrs/day, 5 days/week) and SLP (1-2 hrs/day, 5 days/week) to address physical and functional deficits in the context of the above medical diagnosis(es)? Yes Addressing deficits in the following areas: balance,  endurance, locomotion, strength, transferring, bowel/bladder control, bathing, dressing, feeding, grooming, toileting, cognition and psychosocial support 5. Can the patient actively participate in an intensive therapy program of at least 3 hrs of therapy per day at least 5 days per week? Yes 6. The potential for patient to make measurable gains while on inpatient rehab is excellent 7. Anticipated functional outcomes upon discharge from inpatient rehab are supervision and min assist  with PT, supervision and min assist with OT, modified independent and supervision with SLP. 8. Estimated rehab length of stay to reach the above functional goals is: 18-25 days 9. Anticipated D/C setting: Home 10. Anticipated post D/C treatments: HH therapy and Outpatient therapy 11. Overall Rehab/Functional Prognosis: excellent  RECOMMENDATIONS: This patient's condition is appropriate for continued rehabilitative care in the following setting: CIR Patient has agreed to participate in recommended program. Yes Note that insurance prior authorization may be required for reimbursement for recommended care.  Comment: Rehab Admissions Coordinator to follow up.  Thanks,  Meredith Staggers, MD, [place] [facility] 07/26/2017    Cathlyn Parsons., PA-C 07/26/2017    Revision History                        Routing History

## 2017-08-02 NOTE — PMR Pre-admission (Signed)
PMR Admission Coordinator Pre-Admission Assessment  Patient: [name] is an 61 y.o., male MRN: [id number] DOB: Sep 09, 1956 Height: 5\' 10"  (177.8 cm) Weight: 70.2 kg (154 lb 12.2 oz)             Insurance Information HMO: No    PPO:       PCP:       IPA:       80/20:       OTHER:   PRIMARY:  Medicare A/B      Policy#: [id number]      Subscriber: [place] Name:        Phone#:       Fax#:   Pre-Cert#:        Employer:  Disabled Benefits:  Phone #:       Name: Checked in [employer]. Date: 06/05/02     Deduct: $1340      Out of Pocket Max: none      Life Max: N/A CIR: 100%      SNF: 100 days Outpatient: 80%     Co-Pay: 20% Home Health: 100%      Co-Pay: none DME: 80%     Co-Pay: 20% Providers: patient's choice  SECONDARY:  UHC      Policy#: [id number]      Subscriber:  [name] CM Name:        Phone#:       Fax#:   Pre-Cert#:        Employer:  Retired Benefits:  Phone #: [phone]     Name:  Retired Runner, broadcasting/film/video. 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 Per chart review patient lives with spouse independent prior to [place] still driving. One level  home one-step entry. Presented 07/10/2017 with increasing bilateral lower extremity edema and increasing shortness of breath. Patient recently seen in urgent care 2 weeks ago for right groin pain he did receive Flexeril was given a prescription for Bactrim which she did not take. Chest x-ray showed right pleural effusion and right lung base atelectasis versus infiltrate. Unilateral films of the pelvis showed no fracture. CT abdomen and pelvis showed cirrhosis moderate ascites. Echocardiogram with ejection fraction of 77-82% systolic function severely reduced. Blood cultures MSSAplaced on Intravenous cefazolin 6 weeks until 08/21/2017 then convert to chronic suppressive oral cephalexin therapy. TEE completed showing no evidence of endocarditis and no plans to extract ICD device. Renal service follow-up for severe CKD stage IV initially requiring CRRT and latest creatinine 3.45  and presently on dialysis. Patient remains on chronic Coumadin therapy. Therapy evaluations completed an ongoing patient limited by fatigue. Request made for physical medicine rehabilitation consult.  Patient to be admitted for a comprehensive inpatient rehabilitation program.   Past Medical History  Past Medical History:  Diagnosis Date  . Adenocarcinoma of rectum ([place])    a. 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 AICD (automatic cardioverter/defibrillator) present 2002   BI V ICD  . Anemia   . CAD (coronary artery disease)    a. BMS to LAD 2001 at [facility] b. PTCA/atherectomy ramus and BMS to LAD 2009  . CHF (congestive heart failure) ([place])   . Cholelithiasis 06/2015  . Chronic kidney disease, stage IV (severe) ([place])   . Chronic systolic heart failure ([place])   . Cirrhosis ([place])   . Colostomy in place [facility])   . Dizziness    a. chronic. Admission for this 07/18/2014  . DM type 2, uncontrolled, with renal complications ([place])   . Dysrhythmia   . Essential hypertension, benign   . GERD (gastroesophageal reflux disease)   . HCAP  (healthcare-associated pneumonia) 07/21/2015  . Hematuria   . History of blood transfusion    "I've had 2 units so far this year" (09/27/2015)  . HLD (hyperlipidemia)   . Ischemic cardiomyopathy    EF 18% by nuclear study 2016, multiple myocardial infarctions in past    . Myocardial infarction ([place]) 2001  . Obesity   . Orthostatic hypotension   . OSA on CPAP   . Paroxysmal atrial fibrillation (H[facility])    a. on amiodarone, digoxin and Eliquis  . PONV (postoperative nausea and vomiting)   . Prostate cancer ([place])    a. s/p seed implants with chemo and radiation  . SAH (subarachnoid hemorrhage) ([place])    post-traumatic (fall) [facility] 12/2014  . TIA (transient ischemic attack)     Family History  family history includes Colon cancer in his other; Colon cancer (age of onset: 83) in his sister; Colon cancer (age of onset: 81) in his mother; Coronary artery disease in his father; Diabetes in his sister.  Prior Rehab/Hospitalizations: No previous rehab admissions.  Has the patient had major surgery during 100 days prior to admission? No  Current Medications   Current Facility-Administered Medications:  .  0.9 %  sodium chloride infusion, 250 mL, Intravenous, PRN, Scatliffe, Kristen D, MD, Last Rate: 10 mL/hr at 07/30/17 0400, 250 mL at 07/30/17 0400 .  acetaminophen (TYLENOL) tablet 650 mg, 650 mg, Oral, Q4H PRN, Buford Dresser, MD .  alum & mag hydroxide-simeth (MAALOX/MYLANTA) 200-200-20 MG/5ML suspension 15 mL, 15 mL, Oral, Q6H PRN, Buford Dresser, MD, 15 mL at 07/13/17 1159 .  ceFAZolin (ANCEF) IVPB 2g/100 mL premix, 2 g, Intravenous, Q24H, Carney, [name] Filler, RPH, Stopped at 08/01/17 1807 .  colchicine tablet 0.3 mg, 0.3 mg, Oral, Daily, Scatliffe, Kristen D, MD, 0.3 mg at 08/02/17 1034 .  Darbepoetin Alfa (ARANESP) injection 200 mcg, 200 mcg, Subcutaneous, Q Wed-1800, Deterding, Jeneen Rinks, MD, 200 mcg at 07/27/17 1737 .  feeding supplement (ENSURE ENLIVE) (ENSURE ENLIVE) liquid 237  mL, 237 mL, Oral, BID BM, Milly Jakob, MD, 237 mL at 08/02/17 1202 .  fentaNYL (SUBLIMAZE) injection 25 mcg, 25 mcg, Intravenous, Q2H PRN, Scatliffe, Kristen D, MD, 25 mcg at 07/28/17 1037 .  HYDROcodone-acetaminophen (NORCO) 7.5-325 MG per tablet 1-2 tablet, 1-2 tablet, Oral, Q4H PRN, Laverle Hobby, MD, 2 tablet at 08/01/17 2125 .  insulin aspart (novoLOG) injection 0-5 Units, 0-5 Units, Subcutaneous, QHS, Desai, Rahul P,  PA-C, 3 Units at 07/17/17 2142 .  insulin aspart (novoLOG) injection 0-9 Units, 0-9 Units, Subcutaneous, TID WC, Scatliffe, Rise Paganini, MD, 1 Units at 08/01/17 1738 .  insulin aspart (novoLOG) injection 3 Units, 3 Units, Subcutaneous, TID WC, Desai, Rahul P, PA-C, 3 Units at 08/02/17 1036 .  insulin glargine (LANTUS) injection 10 Units, 10 Units, Subcutaneous, QHS, Javier Glazier, MD, 10 Units at 08/01/17 2126 .  magic mouthwash w/lidocaine, 5 mL, Oral, TID PRN, Clegg, Amy D, NP, 5 mL at 07/11/17 1103 .  midodrine (PROAMATINE) tablet 10 mg, 10 mg, Oral, TID WC, Deterding, James, MD, 10 mg at 08/02/17 1034 .  multivitamin (RENA-VIT) tablet 1 tablet, 1 tablet, Oral, QHS, Milly Jakob, MD, 1 tablet at 08/01/17 2126 .  ondansetron (ZOFRAN) injection 4 mg, 4 mg, Intravenous, Q6H PRN, Buford Dresser, MD, 4 mg at 08/01/17 1100 .  sodium chloride flush (NS) 0.9 % injection 10-40 mL, 10-40 mL, Intracatheter, Q12H, Larey Dresser, MD, 10 mL at 08/02/17 1036 .  sodium chloride flush (NS) 0.9 % injection 10-40 mL, 10-40 mL, Intracatheter, PRN, Larey Dresser, MD, 10 mL at 07/27/17 0414 .  sucroferric oxyhydroxide (VELPHORO) chewable tablet 500 mg, 500 mg, Oral, TID WC, Elmarie Shiley, MD, 500 mg at 08/02/17 1033 .  warfarin (COUMADIN) tablet 1 mg, 1 mg, Oral, ONCE-1800, Otilio Miu, RPH .  Warfarin - Pharmacist Dosing Inpatient, , Does not apply, q1800, Carney, [name] Filler, RPH, Stopped at 07/28/17 1800  Patients Current Diet: Diet renal/carb modified with fluid  restriction Diet-HS Snack? Nothing; Room service appropriate? Yes; Fluid consistency: Thin  Precautions / Restrictions Precautions Precautions: Fall Precaution Comments: Watch BP Restrictions Weight Bearing Restrictions: No   Has the patient had 2 or more falls or a fall with injury in the past year?No  Prior Activity Level Community (5-7x/wk): Went out daily, was driving.  Home Assistive Devices / Equipment Home Assistive Devices/Equipment: Scales, Environmental consultant (specify type), CBG Meter Home Equipment: Walker - 4 wheels, Walker - standard, Tub bench (may be able to find more of "Mom's" equipment)  Prior Device Use: Indicate devices/aids used by the patient prior to current illness, exacerbation or injury? None  Prior Functional Level Prior Function Level of Independence: Independent  Self Care: Did the patient need help bathing, dressing, using the toilet or eating?  Independent  Indoor Mobility: Did the patient need assistance with walking from room to room (with or without device)? Independent  Stairs: Did the patient need assistance with internal or external stairs (with or without device)? Independent  Functional Cognition: Did the patient need help planning regular tasks such as shopping or remembering to take medications? Independent  Current Functional Level Cognition  Overall Cognitive Status: Within Functional Limits for tasks assessed Orientation Level: Oriented X4 Following Commands: Follows one step commands consistently General Comments: WFL for basic info     Extremity Assessment (includes Sensation/Coordination)  Upper Extremity Assessment: Generalized weakness  Lower Extremity Assessment: Generalized weakness, [name] deficits/detail, LLE deficits/detail [name] Deficits / Details: R LE stiffer and more painful than L LE [name] Coordination: decreased fine motor LLE Deficits / Details: more mobile and stronger at 3+; less stiff and painful than R LE LLE Coordination:  decreased fine motor    ADLs  Overall ADL's : Needs assistance/impaired Eating/Feeding: Bed level, Set up Grooming: Wash/dry hands, Wash/dry face, Minimal assistance, Brushing hair, Sitting Upper Body Bathing: Maximal assistance, Bed level, Sitting Lower Body Bathing: Total assistance, Sit to/from stand Upper Body Dressing : Sitting,  Total assistance Lower Body Dressing: Total assistance, Sit to/from stand Toilet Transfer: Moderate assistance, Stand-pivot, BSC, RW Toileting- Clothing Manipulation and Hygiene: Maximal assistance, Sit to/from stand Functional mobility during ADLs: Moderate assistance, Rolling walker    Mobility  Overal bed mobility: Needs Assistance Bed Mobility: Supine to Sit Rolling: Min assist Sidelying to sit: Mod assist Supine to sit: HOB elevated, Min assist Sit to supine: Mod assist General bed mobility comments: use of bed rail; cues for technique; assist to elevate trunk into sitting; difficulty pushing through R UE due to pain     Transfers  Overall transfer level: Needs assistance Equipment used: Rolling walker (2 wheeled) Transfer via Lift Equipment: Stedy Transfers: Sit to/from [employer] to Stand: Min assist, Mod assist, +2 physical assistance Stand pivot transfers: Min assist  Lateral/Scoot Transfers: Max assist, +2 physical assistance General transfer comment: assist to power up into standing; carry over of safe hand placement and positioning in preparation to stand    Ambulation / Gait / Stairs / Wheelchair Mobility  Ambulation/Gait Ambulation/Gait assistance: +2 safety/equipment, Min assist Ambulation Distance (Feet): 70 Feet Assistive device: Rolling walker (2 wheeled) Gait Pattern/deviations: Step-through pattern, Decreased step length - right, Decreased step length - left, Trunk flexed General Gait Details: cues for posture and proximity of RW; assis to steady  Gait velocity: decreased    Posture / Balance Dynamic Sitting Balance Sitting  balance - Comments: able to maintain static sitting EOB with min guard assist  Balance Overall balance assessment: Needs assistance Sitting-balance support: Feet supported, No upper extremity supported Sitting balance-Leahy Scale: Fair Sitting balance - Comments: able to maintain static sitting EOB with min guard assist  Postural control: Posterior lean Standing balance support: Bilateral upper extremity supported Standing balance-Leahy Scale: Poor Standing balance comment: requires bil. Ue support     Special needs/care consideration BiPAP/CPAP No CPM No Continuous Drip IV KVO Dialysis Yes        Days M-W-F Life Vest No Oxygen No Special Bed No Trach Size No Wound Vac (area) No      Skin New fistual graft site right upper arm                             Bowel mgmt: Colostomy with BM 08/01/17 Bladder mgmt: Urinary catheter Diabetic mgmt Yes, on insulin at home    Previous Home Environment Living Arrangements: Spouse/significant other Available Help at Discharge: Family, Available PRN/intermittently Type of Home: House Home Layout: One level Home Access: Stairs to enter Technical brewer of Steps: 1 Bathroom Shower/Tub: Public librarian, Multimedia programmer: Standard Home Care Services: No  Discharge Living Setting Plans for Discharge Living Setting: Patient's home, House, Lives with (comment) (Lives with wife.) Type of Home at Discharge: House Discharge Home Layout: One level Discharge Home Access: Stairs to enter Entrance Stairs-Number of Steps: 1 step entry Does the patient have any problems obtaining your medications?: No  Social/Family/Support Systems Patient Roles: Spouse, Parent, Other (Comment) (Has a wife, son and nephews.) Contact Information: Curran Lenderman - wife - [phone] Anticipated Caregiver: Wife, son and nephews.  Wife recovered from car accident 2016 with residual right arm weakness.  Son lives 5 mins away and works.  Several nephews  and neighbors can assist intermittently as needed. Anticipated Caregiver's Contact Information: Aadhav Uhlig - son - [phone] Caregiver Availability: 24/7 Discharge Plan Discussed with Primary Caregiver: Yes Is Caregiver In Agreement with Plan?: Yes Does Caregiver/Family have Issues with Lodging/Transportation  while Pt is in Rehab?: No  Goals/Additional Needs Patient/Family Goal for Rehab: PT/OT supervision to min assist goals Expected length of stay: 18-25 days Cultural Considerations: None Dietary Needs: Renal, carb mod, fluid restricted, thin liquids Equipment Needs: TBD Special Service Needs: HD M-W-F.  Will need to be clipped to [place] with [place]. Pt/Family Agrees to Admission and willing to participate: Yes Program Orientation Provided & Reviewed with Pt/Caregiver Including Roles  & Responsibilities: Yes  Decrease burden of Care through IP rehab admission: N/A  Possible need for SNF placement upon discharge: Not planned  Patient Condition: This patient's medical and functional status has changed since the consult dated: 07/26/17 in which the Rehabilitation Physician determined and documented that the patient's condition is appropriate for intensive rehabilitative care in an inpatient rehabilitation facility. See "History of Present Illness" (above) for medical update. Functional changes are: Currently requiring min to mod assist assist, ambulated min assist 70 feet RW. Patient's medical and functional status update has been discussed with the Rehabilitation physician and patient remains appropriate for inpatient rehabilitation. Will admit to inpatient rehab today.  Preadmission Screen Completed By:  Retta Diones, 08/02/2017 12:19 PM ______________________________________________________________________   Discussed status with Dr. Naaman Plummer on 08/02/17 at 1219 and received telephone approval for admission today.  Admission Coordinator:  Retta Diones, time 1219/Date  08/02/17

## 2017-08-02 NOTE — Progress Notes (Signed)
Patient ID: [name], male   DOB: Feb 27, 1956, 62 y.o.   MRN: [id number]  [facility] Progress Note   Assessment/ Plan:   1. Staphylococcus bacteremia with septic/cardiogenic shock: Remains afebrile on Ancef. Repeat blood cultures from 07/11/17 are negative. 2. ESRD: Continue hemodialysis on a Monday/Wednesday/Friday schedule via a right brachiocephalic fistula. Process underway for outpatient dialysis unit placement to [facility]. 3. Anemia:no overt loss, continue aranesp 4. CKD-MBD:hyperphosphatemia noted-- start binder and monitor Ca/P. PTH low. 5. Nutrition:with evidence of protein-calorie malnutrition likely with poor wound healing.  6. Deconditioning: ongoing evaluation by PT/OT and plans for possible CIR admission versus admission to skilled nursing facility  Subjective:   Ports to be feeling fair at this time-denies any chest pain or shortness of breath. Able to ambulate in room with assistance.    Objective:   BP (!) 96/57 (BP Location: Left Arm)   Pulse 79   Temp 97.7 F (36.5 C) (Oral)   Resp 13   Ht 5\' 10"  (1.778 m)   Wt 70.2 kg (154 lb 12.2 oz)   SpO2 97%   BMI 22.21 kg/m   Physical Exam: RSW:[id number] resting in bed, watching television CVS: Pulse regular rhythm, normal rate, 2/6 HSM over apex Resp:Decreased breath sounds over bases, no rales FGH:WEXH, flat, non-tender, bowel sounds normal Ext:1+ LE edema with both lower legs in gauze dressing. Right brachiocephalic fistula.  Labs: BMET  Recent Labs Lab 07/27/17 0356 07/28/17 0430 07/29/17 0456 07/30/17 0740 07/31/17 0500 08/01/17 0724 08/02/17 0500  NA 131* 129* 130* 132* 133* 131* 132*  K 3.4* 4.3 3.8 3.6 3.5 3.3* 3.6  CL 98* 96* 96* 96* 96* 94* 95*  CO2 23 22 22 23 24 23 26   GLUCOSE 104* 137* 110* 100* 75 137* 100*  BUN 55* 61* 70* 74* 54* 59* 44*  CREATININE 3.44* 3.83* 4.10* 4.35* 3.65* 4.09* 3.45*  CALCIUM 8.4* 8.6* 8.8* 8.7* 8.2* 8.6* 8.4*  PHOS 6.0* 6.3* 6.3* 6.7*  5.5* 5.9* 5.0*   CBC  Recent Labs Lab 07/29/17 0456 07/30/17 0452 07/31/17 0500 08/01/17 0724  WBC 5.7 6.4 6.3 7.0  HGB 8.5* 8.9* 9.0* 9.3*  HCT 26.9* 27.9* 28.3* 29.1*  MCV 90.9 90.9 94.0 93.3  PLT 102* 116* 120* 143*   Medications:    . colchicine  0.3 mg Oral Daily  . darbepoetin (ARANESP) injection - NON-DIALYSIS  200 mcg Subcutaneous Q Wed-1800  . feeding supplement (ENSURE ENLIVE)  237 mL Oral BID BM  . furosemide  160 mg Oral TID  . insulin aspart  0-5 Units Subcutaneous QHS  . insulin aspart  0-9 Units Subcutaneous TID WC  . insulin aspart  3 Units Subcutaneous TID WC  . insulin glargine  10 Units Subcutaneous QHS  . midodrine  10 mg Oral TID WC  . multivitamin  1 tablet Oral QHS  . sodium chloride flush  10-40 mL Intracatheter Q12H  . sucroferric oxyhydroxide  500 mg Oral TID WC  . Warfarin - Pharmacist Dosing Inpatient   Does not apply [id number]   Elmarie Shiley, MD 08/02/2017, 8:02 AM

## 2017-08-02 NOTE — Progress Notes (Signed)
Physical Therapy Treatment Patient Details Name: [name] MRN: [id number] DOB: 16-Jul-1956 Today's Date: 08/02/2017    History of Present Illness 61 yr old male with extensive PMHx MI, ICM, HFrEF EF15%-20% ,BiVICD, CKD Stage IV, DM, GERD, OSA on CPAP, PAF, presents with weight gain, lower extremity swelling and decreased urine output within the last week.   Work up included AOCsHF/cardiogenic shock, BiV MSSA, demand ischemia.  Pt continues on CRRT.    PT Comments    Patient continues to make progress with mobility and is motivated to participate in therapy. Current plan remains appropriate.   Follow Up Recommendations  CIR;Supervision/Assistance - 24 hour     Equipment Recommendations  Other (comment) (TBA)    Recommendations for Other Services Rehab consult     Precautions / Restrictions Precautions Precautions: Fall Restrictions Weight Bearing Restrictions: No    Mobility  Bed Mobility Overal bed mobility: Needs Assistance Bed Mobility: Supine to Sit     Supine to sit: HOB elevated;Min assist     General bed mobility comments: use of bed rail; cues for technique; assist to elevate trunk into sitting; difficulty pushing through R UE due to pain   Transfers Overall transfer level: Needs assistance Equipment used: Rolling walker (2 wheeled) Transfers: Sit to/from Stand Sit to Stand: Min assist;Mod assist;+2 physical assistance         General transfer comment: assist to power up into standing; carry over of safe hand placement and positioning in preparation to stand  Ambulation/Gait Ambulation/Gait assistance: +2 safety/equipment;Min assist Ambulation Distance (Feet): 70 Feet Assistive device: Rolling walker (2 wheeled) Gait Pattern/deviations: Step-through pattern;Decreased step length - right;Decreased step length - left;Trunk flexed Gait velocity: decreased   General Gait Details: cues for posture and proximity of RW; assis to steady    Marine scientist Rankin (Stroke Patients Only)       Balance Overall balance assessment: Needs assistance Sitting-balance support: Feet supported;No upper extremity supported Sitting balance-Leahy Scale: Fair     Standing balance support: Bilateral upper extremity supported Standing balance-Leahy Scale: Poor                              Cognition Arousal/Alertness: Awake/alert Behavior During Therapy: WFL for tasks assessed/performed Overall Cognitive Status: Within Functional Limits for tasks assessed                                        Exercises      General Comments        Pertinent Vitals/Pain Pain Assessment: Faces Faces Pain Scale: Hurts a little bit Pain Location: R hip and R UE with weightbearing Pain Descriptors / Indicators: Guarding;Sore Pain Intervention(s): Limited activity within patient's tolerance;Monitored during session;Repositioned    Home Living                      Prior Function            PT Goals (current goals can now be found in the care plan section) Acute Rehab PT Goals PT Goal Formulation: With patient Time For Goal Achievement: 07/29/17 Potential to Achieve Goals: Good Progress towards PT goals: Progressing toward goals    Frequency    Min 3X/week      PT Plan  Current plan remains appropriate    Co-evaluation              AM-PAC PT "6 Clicks" Daily Activity  Outcome Measure  Difficulty turning over in bed (including adjusting bedclothes, sheets and blankets)?: Unable Difficulty moving from lying on back to sitting on the side of the bed? : Unable Difficulty sitting down on and standing up from a chair with arms (e.g., wheelchair, bedside commode, etc,.)?: Unable Help needed moving to and from a bed to chair (including a wheelchair)?: A Little Help needed walking in hospital room?: A Little Help needed climbing 3-5 steps with a railing? : A  Lot 6 Click Score: 11    End of Session Equipment Utilized During Treatment: Gait belt Activity Tolerance: Patient tolerated treatment well Patient left: with call bell/phone within reach;in chair;with family/visitor present Nurse Communication: Mobility status PT Visit Diagnosis: Other abnormalities of gait and mobility (R26.89);Muscle weakness (generalized) (M62.81);Pain Pain - Right/Left: Right Pain - part of body: Hip     Time: 6294-7654 PT Time Calculation (min) (ACUTE ONLY): 33 min  Charges:  $Gait Training: 8-22 mins $Therapeutic Activity: 8-22 mins                    G Codes:       Earney Navy, PTA Pager: [phone]     Darliss Cheney 08/02/2017, 11:42 AM

## 2017-08-02 NOTE — Progress Notes (Signed)
Patient ID: [name], male   DOB: 1956-06-15, 61 y.o.   MRN: [id number]     Advanced Heart Failure Rounding Note  Primary Cardiologist: Beuna Bolding  Subjective:    Admitted with staph bacteremia with mixed septic/cardiogenic shock.   TEE 07/19/17 with LVEF 20%. No evidence of endocarditis or vegetation on valves/ICD  CVVD stopped 07/20/17.   Tolerated iHD 7/27  Feeling ok. Complaining fatigue after iHD.     Objective:   Weight Range: 154 lb 12.2 oz (70.2 kg) Body mass index is 22.21 kg/m.   Vital Signs:   Temp:  [97.6 F (36.4 C)-98.3 F (36.8 C)] 97.7 F (36.5 C) (08/28 0754) Pulse Rate:  [68-79] 79 (08/28 0754) Resp:  [11-17] 13 (08/28 0754) BP: (75-96)/(52-59) 96/57 (08/28 0754) SpO2:  [93 %-100 %] 97 % (08/28 0754) Weight:  [143 lb 11.8 oz (65.2 kg)-154 lb 12.2 oz (70.2 kg)] 154 lb 12.2 oz (70.2 kg) (08/28 0600) Last BM Date: 08/01/17  Weight change: Filed Weights   08/01/17 0710 08/01/17 1019 08/02/17 0600  Weight: 148 lb 13 oz (67.5 kg) 143 lb 11.8 oz (65.2 kg) 154 lb 12.2 oz (70.2 kg)    Intake/Output:   Intake/Output Summary (Last 24 hours) at 08/02/17 0924 Last data filed at 08/02/17 0757  Gross per 24 hour  Intake              340 ml  Output             2826 ml  Net            -2486 ml      Physical Exam   General:  Appears fatigued. No resp difficulty HEENT: normal Neck: supple. JVP to jaw no JVD. Carotids 2+ bilat; no bruits. No lymphadenopathy or thryomegaly appreciated. Cor: PMI nondisplaced. Regular rate & rhythm. No rubs, gallops or murmurs. Lungs: clear Abdomen: soft, nontender, nondistended. No hepatosplenomegaly. No bruits or masses. Good bowel sounds. Extremities: no cyanosis, clubbing, rash, RUE fistula edema. R and LLE edema 2+  Neuro: alert & orientedx3, cranial nerves grossly intact. moves all 4 extremities w/o difficulty. Affect pleasant   Telemetry   Personally reviewed 65s a sense v paced.   Labs    CBC  Recent  Labs  07/31/17 0500 08/01/17 0724  WBC 6.3 7.0  HGB 9.0* 9.3*  HCT 28.3* 29.1*  MCV 94.0 93.3  PLT 120* 143*   Basic Metabolic Panel  Recent Labs  07/31/17 0500 08/01/17 0724 08/02/17 0500  NA 133* 131* 132*  K 3.5 3.3* 3.6  CL 96* 94* 95*  CO2 24 23 26   GLUCOSE 75 137* 100*  BUN 54* 59* 44*  CREATININE 3.65* 4.09* 3.45*  CALCIUM 8.2* 8.6* 8.4*  MG 2.0  --   --   PHOS 5.5* 5.9* 5.0*   Liver Function Tests  Recent Labs  08/01/17 0724 08/02/17 0500  AST 20  --   ALT <5*  --   ALKPHOS 106  --   BILITOT 0.8  --   PROT 6.7  --   ALBUMIN 1.8* 1.9*   No results for input(s): LIPASE, AMYLASE in the last 72 hours. Cardiac Enzymes No results for input(s): CKTOTAL, CKMB, CKMBINDEX, TROPONINI in the last 72 hours.  BNP: BNP (last 3 results)  Recent Labs  06/13/17 1205 07/09/17 2151  BNP 1,151.0* 1,553.9*    ProBNP (last 3 results) No results for input(s): PROBNP in the last 8760 hours.   D-Dimer No results for input(s): DDIMER  in the last 72 hours. Hemoglobin A1C No results for input(s): HGBA1C in the last 72 hours. Fasting Lipid Panel No results for input(s): CHOL, HDL, LDLCALC, TRIG, CHOLHDL, LDLDIRECT in the last 72 hours. Thyroid Function Tests No results for input(s): TSH, T4TOTAL, T3FREE, THYROIDAB in the last 72 hours.  Invalid input(s): FREET3  Other results:   Imaging   No results found.  Medications:     Scheduled Medications: . colchicine  0.3 mg Oral Daily  . darbepoetin (ARANESP) injection - NON-DIALYSIS  200 mcg Subcutaneous Q Wed-1800  . feeding supplement (ENSURE ENLIVE)  237 mL Oral BID BM  . insulin aspart  0-5 Units Subcutaneous QHS  . insulin aspart  0-9 Units Subcutaneous TID WC  . insulin aspart  3 Units Subcutaneous TID WC  . insulin glargine  10 Units Subcutaneous QHS  . midodrine  10 mg Oral TID WC  . multivitamin  1 tablet Oral QHS  . sodium chloride flush  10-40 mL Intracatheter Q12H  . sucroferric oxyhydroxide   500 mg Oral TID WC  . Warfarin - Pharmacist Dosing Inpatient   Does not apply q1800    Infusions: . sodium chloride 250 mL (07/30/17 0400)  .  ceFAZolin (ANCEF) IV Stopped (08/01/17 1807)    PRN Medications: sodium chloride, acetaminophen, alum & mag hydroxide-simeth, fentaNYL (SUBLIMAZE) injection, HYDROcodone-acetaminophen, magic mouthwash w/lidocaine, ondansetron (ZOFRAN) IV, sodium chloride flush    Patient Profile   [name] is a 61 year old with a history of DMII, SAH 2016, rectal cancer, CKD Stage IV, chronic A fib, S/P AVN ablation, PAF, CAD BMS LAD 2011 BMS LAD 2009, OSA, chronic systolic heart failure, St Jude BiV ICD admitted with AKI and shock with elevated lactate, suspect cardiogenic.   Assessment/Plan   1. Acute on chronic systolic CHF/cardiogenic shock: Last echo in 8/17 with EF 15-20%.  Ischemic cardiomyopathy, has St Jude BiV ICD (despite chronic afib appears to be effectively BiV pacing).  Had recent admission (discharged 06/21/17) requiring milrinone support and diuresis.  Admitted with soft BP (SBP 80s-90s) + elevated lactate.  Suspect mixed septic/cardiogenic shock with MSSA bacteremia.  - CVVHD stopped 07/20/17.  - Remains volume overload. Tolerating iHD.   -  No beta blocker with decompensation, hypotension.  - No ARB with AKI.  - Remove foley  2. AKI on CKD stage 4:  -Tolerating iHD. Starting to set up outpatient follow up.  -Continue midodrine.  - Still making some urine . Remove foley.  - Management per Nephrology  3. Atrial fibrillation: Chronic - Continue coumadin. No plans to extract device.  - INR 2.98 . Discussed with PharmD  4. CAD:  - No ischemia s/s  - Continue ASA. No statin with recent elevated LFT's    5. ID:  - Blood CX 2/2 MSSA. Antibiotics narrowed, now on Ancef. 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   - Not planning ICD extraction for now due to severe comorbidities. He is device dependent.  - TEE 07/19/17 with no  evidence of endocarditis.  -Per ID continue cefazolin until 9/15 the start renally-dosed cephalexin.  -PICC placed.    6. H/O Rectal CA with diverting colostomy   7. Type II diabetes:  - Continue sliding scale. Glucose stable.    8. Liver failure:  LFTs elevated on admit. Resolved.   9. Anemia of chronic disease. - s/p RBCs on 8/17.  No bleeding problems.    10. Deconditioning-- PT/OT recommending CIR.   Can go to CIR when  bed available.     Length of Stay: 23  Tonye Becket, NP  08/02/2017, 9:24 AM  Advanced Heart Failure Team Pager [phone] (M-F; 7a - 4p)  Please contact [facility] for night-coverage after hours (4p -7a ) and weekends on [email or web address]  Patient seen and examined with Tonye Becket, NP. We discussed all aspects of the encounter. I agree with the assessment and plan as stated above.   Remains weak. Tolerating HD but had to stop once due to low BP. Volume status still elevated but may be as good as we can get him. Remains on abx for MSSA bacteremia. Agree with transfer to CIR when bed available.   Arvilla Meres, MD  2:10 PM

## 2017-08-02 NOTE — Clinical Social Work Note (Signed)
Patient has orders to discharge to CIR today.  CSW signing off.   Merriel Zinger, CSW [phone]  

## 2017-08-02 NOTE — Progress Notes (Signed)
Robert Diones, RN Rehab Admission Coordinator Signed Physical Medicine and Rehabilitation  PMR Pre-admission Date of Service: 08/02/2017 11:50 AM  Related encounter: ED to Hosp-Admission (Current) from 07/09/2017 in [place] [facility]       [] Hide copied text PMR Admission Coordinator Pre-Admission Assessment  Patient: [name] is an 61 y.o., male MRN: [id number] DOB: 07/31/1956 Height: 5\' 10"  (177.8 cm) Weight: 70.2 kg (154 lb 12.2 oz)                                                                                                                                            Insurance Information HMO: No    PPO:       PCP:       IPA:       80/20:       OTHER:   PRIMARY:  Medicare A/B      Policy#: [id number]      Subscriber: [name] Name:        Phone#:       Fax#:   Pre-Cert#:        Employer:  Disabled Benefits:  Phone #:       Name: Checked in [place]. Date: 06/05/02     Deduct: $1340      Out of Pocket Max: none      Life Max: N/A CIR: 100%      SNF: 100 days Outpatient: 80%     Co-Pay: 20% Home Health: 100%      Co-Pay: none DME: 80%     Co-Pay: 20% Providers: patient's choice  SECONDARY:  UHC      Policy#: [id number]      Subscriber:  [name] CM Name:        Phone#:       Fax#:   Pre-Cert#:        Employer:  Retired Benefits:  Phone #: [phone]     Name:  Retired Runner, broadcasting/film[employer]video. 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 Per chart review patient lives with spouse independent prior to [place] still driving. One level home one-step entry. Presented 07/10/2017 with increasing bilateral lower extremity edema and increasing shortness of breath. Patient recently seen in urgent care 2 weeks ago for right groin pain he did receive Flexeril was given a prescription for Bactrim which she did not take. Chest x-ray showed right pleural effusion and right lung base atelectasis versus infiltrate. Unilateral films of the pelvis showed no fracture. CT abdomen and pelvis showed cirrhosis moderate ascites. Echocardiogram with ejection fraction of 35-46% systolic function severely reduced. Blood cultures MSSAplaced on Intravenous cefazolin 6 weeks until 08/21/2017 then convert to chronic suppressive oral cephalexin therapy. TEE completedshowing no evidence of endocarditis and no plans to extract ICD device. Renal service follow-up for severe CKD stage IV initially requiring CRRT and latest creatinine 3.45 and presently on dialysis. Patient remains on chronic Coumadin therapy. Therapy evaluations completed an ongoing patient limited by fatigue. Request made for physical medicine rehabilitation consult.  Patient to be admitted for a comprehensive inpatient rehabilitation program.   Past Medical History      Past Medical History:  Diagnosis Date  . Adenocarcinoma of rectum ([place])    a. 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 AICD (automatic cardioverter/defibrillator) present 2002   BI V ICD  . Anemia   . CAD (coronary artery disease)    a. BMS to LAD [facility] at [facility] b. PTCA/atherectomy ramus and BMS to LAD 2009  . CHF (congestive heart  failure) ([place])   . Cholelithiasis 06/2015  . Chronic kidney disease, stage IV (severe) ([place])   . Chronic systolic heart failure ([place])   . Cirrhosis ([place])   . Colostomy in place [facility])   . Dizziness    a. chronic. Admission for this 07/18/2014  . DM type 2, uncontrolled, with renal complications ([place])   . Dysrhythmia   . Essential hypertension, benign   . GERD (gastroesophageal reflux disease)   . HCAP (healthcare-associated pneumonia) 07/21/2015  . Hematuria   . History of blood transfusion    "I've had 2 units so far this year" (09/27/2015)  . HLD (hyperlipidemia)   . Ischemic cardiomyopathy    EF 18% by nuclear study 2016, multiple myocardial infarctions in past    . Myocardial infarction ([place]) 2001  . Obesity   . Orthostatic hypotension   . OSA on CPAP   . Paroxysmal atrial fibrillation ([facility])    a. on amiodarone, digoxin and Eliquis  . PONV (postoperative nausea and vomiting)   . Prostate cancer ([place])    a. s/p seed implants with chemo and radiation  . SAH (subarachnoid hemorrhage) ([place])    post-traumatic (fall) [facility] 12/2014  . TIA (transient ischemic attack)     Family History  family history includes Colon cancer in his other; Colon cancer (age of onset: 70) in his sister; Colon cancer (age of onset: 94) in his mother; Coronary artery disease in his father; Diabetes in his sister.  Prior Rehab/Hospitalizations: No previous rehab admissions.  Has the patient had major surgery during 100 days prior to admission? No  Current Medications   Current Facility-Administered Medications:  .  0.9 %  sodium chloride infusion, 250 mL, Intravenous, PRN, Scatliffe, Kristen D, MD, Last Rate: 10 mL/hr at 07/30/17 0400, 250 mL at 07/30/17 0400 .  acetaminophen (  TYLENOL) tablet 650 mg, 650 mg, Oral, Q4H PRN, Buford Dresser, MD .  alum & mag hydroxide-simeth (MAALOX/MYLANTA) 200-200-20 MG/5ML suspension 15 mL, 15 mL, Oral, Q6H PRN, Buford Dresser, MD, 15 mL at 07/13/17 1159 .  ceFAZolin (ANCEF) IVPB 2g/100 mL premix, 2 g, Intravenous, Q24H, Carney, [name] Filler, RPH, Stopped at 08/01/17 1807 .  colchicine tablet 0.3 mg, 0.3 mg, Oral, Daily, Scatliffe, Kristen D, MD, 0.3 mg at 08/02/17 1034 .  Darbepoetin Alfa (ARANESP) injection 200 mcg, 200 mcg, Subcutaneous, Q Wed-1800, Deterding, Jeneen Rinks, MD, 200 mcg at 07/27/17 1737 .  feeding supplement (ENSURE ENLIVE) (ENSURE ENLIVE) liquid 237 mL, 237 mL, Oral, BID BM, Milly Jakob, MD, 237 mL at 08/02/17 1202 .  fentaNYL (SUBLIMAZE) injection 25 mcg, 25 mcg, Intravenous, Q2H PRN, Scatliffe, Kristen D, MD, 25 mcg at 07/28/17 1037 .  HYDROcodone-acetaminophen (NORCO) 7.5-325 MG per tablet 1-2 tablet, 1-2 tablet, Oral, Q4H PRN, Laverle Hobby, MD, 2 tablet at 08/01/17 2125 .  insulin aspart (novoLOG) injection 0-5 Units, 0-5 Units, Subcutaneous, QHS, Desai, Rahul P, PA-C, 3 Units at 07/17/17 2142 .  insulin aspart (novoLOG) injection 0-9 Units, 0-9 Units, Subcutaneous, TID WC, Scatliffe, Rise Paganini, MD, 1 Units at 08/01/17 1738 .  insulin aspart (novoLOG) injection 3 Units, 3 Units, Subcutaneous, TID WC, Desai, Rahul P, PA-C, 3 Units at 08/02/17 1036 .  insulin glargine (LANTUS) injection 10 Units, 10 Units, Subcutaneous, QHS, Javier Glazier, MD, 10 Units at 08/01/17 2126 .  magic mouthwash w/lidocaine, 5 mL, Oral, TID PRN, Clegg, Amy D, NP, 5 mL at 07/11/17 1103 .  midodrine (PROAMATINE) tablet 10 mg, 10 mg, Oral, TID WC, Deterding, James, MD, 10 mg at 08/02/17 1034 .  multivitamin (RENA-VIT) tablet 1 tablet, 1 tablet, Oral, QHS, Milly Jakob, MD, 1 tablet at 08/01/17 2126 .  ondansetron (ZOFRAN) injection 4 mg, 4 mg, Intravenous, Q6H PRN, Buford Dresser, MD, 4 mg at 08/01/17 1100 .  sodium chloride flush (NS) 0.9 % injection 10-40 mL, 10-40 mL, Intracatheter, Q12H, Larey Dresser, MD, 10 mL at 08/02/17 1036 .  sodium chloride flush (NS) 0.9 % injection 10-40 mL, 10-40 mL,  Intracatheter, PRN, Larey Dresser, MD, 10 mL at 07/27/17 0414 .  sucroferric oxyhydroxide (VELPHORO) chewable tablet 500 mg, 500 mg, Oral, TID WC, Elmarie Shiley, MD, 500 mg at 08/02/17 1033 .  warfarin (COUMADIN) tablet 1 mg, 1 mg, Oral, ONCE-1800, Otilio Miu, RPH .  Warfarin - Pharmacist Dosing Inpatient, , Does not apply, q1800, Carney, [name] Filler, RPH, Stopped at 07/28/17 1800  Patients Current Diet: Diet renal/carb modified with fluid restriction Diet-HS Snack? Nothing; Room service appropriate? Yes; Fluid consistency: Thin  Precautions / Restrictions Precautions Precautions: Fall Precaution Comments: Watch BP Restrictions Weight Bearing Restrictions: No   Has the patient had 2 or more falls or a fall with injury in the past year?No  Prior Activity Level Community (5-7x/wk): Went out daily, was driving.  Home Assistive Devices / Equipment Home Assistive Devices/Equipment: Scales, Environmental consultant (specify type), CBG Meter Home Equipment: Walker - 4 wheels, Walker - standard, Tub bench (may be able to find more of "Mom's" equipment)  Prior Device Use: Indicate devices/aids used by the patient prior to current illness, exacerbation or injury? None  Prior Functional Level Prior Function Level of Independence: Independent  Self Care: Did the patient need help bathing, dressing, using the toilet or eating?  Independent  Indoor Mobility: Did the patient need assistance with walking from room to room (with or without device)?  Independent  Stairs: Did the patient need assistance with internal or external stairs (with or without device)? Independent  Functional Cognition: Did the patient need help planning regular tasks such as shopping or remembering to take medications? Independent  Current Functional Level Cognition  Overall Cognitive Status: Within Functional Limits for tasks assessed Orientation Level: Oriented X4 Following Commands: Follows one step commands  consistently General Comments: WFL for basic info     Extremity Assessment (includes Sensation/Coordination)  Upper Extremity Assessment: Generalized weakness  Lower Extremity Assessment: Generalized weakness, [name] deficits/detail, LLE deficits/detail [name] Deficits / Details: R LE stiffer and more painful than L LE [name] Coordination: decreased fine motor LLE Deficits / Details: more mobile and stronger at 3+; less stiff and painful than R LE LLE Coordination: decreased fine motor    ADLs  Overall ADL's : Needs assistance/impaired Eating/Feeding: Bed level, Set up Grooming: Wash/dry hands, Wash/dry face, Minimal assistance, Brushing hair, Sitting Upper Body Bathing: Maximal assistance, Bed level, Sitting Lower Body Bathing: Total assistance, Sit to/from stand Upper Body Dressing : Sitting, Total assistance Lower Body Dressing: Total assistance, Sit to/from stand Toilet Transfer: Moderate assistance, Stand-pivot, BSC, RW Toileting- Clothing Manipulation and Hygiene: Maximal assistance, Sit to/from stand Functional mobility during ADLs: Moderate assistance, Rolling walker    Mobility  Overal bed mobility: Needs Assistance Bed Mobility: Supine to Sit Rolling: Min assist Sidelying to sit: Mod assist Supine to sit: HOB elevated, Min assist Sit to supine: Mod assist General bed mobility comments: use of bed rail; cues for technique; assist to elevate trunk into sitting; difficulty pushing through R UE due to pain     Transfers  Overall transfer level: Needs assistance Equipment used: Rolling walker (2 wheeled) Transfer via Lift Equipment: Stedy Transfers: Sit to/from [employer] to Stand: Min assist, Mod assist, +2 physical assistance Stand pivot transfers: Min assist  Lateral/Scoot Transfers: Max assist, +2 physical assistance General transfer comment: assist to power up into standing; carry over of safe hand placement and positioning in preparation to stand    Ambulation /  Gait / Stairs / Wheelchair Mobility  Ambulation/Gait Ambulation/Gait assistance: +2 safety/equipment, Min assist Ambulation Distance (Feet): 70 Feet Assistive device: Rolling walker (2 wheeled) Gait Pattern/deviations: Step-through pattern, Decreased step length - right, Decreased step length - left, Trunk flexed General Gait Details: cues for posture and proximity of RW; assis to steady  Gait velocity: decreased    Posture / Balance Dynamic Sitting Balance Sitting balance - Comments: able to maintain static sitting EOB with min guard assist  Balance Overall balance assessment: Needs assistance Sitting-balance support: Feet supported, No upper extremity supported Sitting balance-Leahy Scale: Fair Sitting balance - Comments: able to maintain static sitting EOB with min guard assist  Postural control: Posterior lean Standing balance support: Bilateral upper extremity supported Standing balance-Leahy Scale: Poor Standing balance comment: requires bil. Ue support     Special needs/care consideration BiPAP/CPAP No CPM No Continuous Drip IV KVO Dialysis Yes        Days M-W-F Life Vest No Oxygen No Special Bed No Trach Size No Wound Vac (area) No      Skin New fistual graft site right upper arm                             Bowel mgmt: Colostomy with BM 08/01/17 Bladder mgmt: Urinary catheter Diabetic mgmt Yes, on insulin at home    Previous Home Environment Living Arrangements: Spouse/significant other Available Help  at Discharge: Family, Available PRN/intermittently Type of Home: House Home Layout: One level Home Access: Stairs to enter [employer] of Steps: 1 Bathroom Shower/Tub: Public librarian, Multimedia programmer: [place]: No  Discharge Living Setting Plans for Discharge Living Setting: Patient's home, House, Lives with (comment) (Lives with wife.) Type of Home at Discharge: House Discharge Home Layout: One level Discharge  Home Access: Stairs to enter Entrance Stairs-Number of Steps: 1 step entry Does the patient have any problems obtaining your medications?: No  Social/Family/Support Systems Patient Roles: Spouse, Parent, Other (Comment) (Has a wife, son and nephews.) Contact Information: Lou Loewe - wife - [phone] Anticipated Caregiver: Wife, son and nephews.  Wife recovered from car accident 2016 with residual right arm weakness.  Son lives 5 mins away and works.  Several nephews and neighbors can assist intermittently as needed. Anticipated Caregiver's Contact Information: Jayron Maqueda - son - [phone] Caregiver Availability: 24/7 Discharge Plan Discussed with Primary Caregiver: Yes Is Caregiver In Agreement with Plan?: Yes Does Caregiver/Family have Issues with Lodging/Transportation while Pt is in Rehab?: No  Goals/Additional Needs Patient/Family Goal for Rehab: PT/OT supervision to min assist goals Expected length of stay: 18-25 days Cultural Considerations: None Dietary Needs: Renal, carb mod, fluid restricted, thin liquids Equipment Needs: TBD Special Service Needs: HD M-W-F.  Will need to be clipped to [place] area with [place]. Pt/Family Agrees to Admission and willing to participate: Yes Program Orientation Provided & Reviewed with Pt/Caregiver Including Roles  & Responsibilities: Yes  Decrease burden of Care through IP rehab admission: N/A  Possible need for SNF placement upon discharge: Not planned  Patient Condition: This patient's medical and functional status has changed since the consult dated: 07/26/17 in which the Rehabilitation Physician determined and documented that the patient's condition is appropriate for intensive rehabilitative care in an inpatient rehabilitation facility. See "History of Present Illness" (above) for medical update. Functional changes are: Currently requiring min to mod assist assist, ambulated min assist 70 feet RW. Patient's medical and  functional status update has been discussed with the Rehabilitation physician and patient remains appropriate for inpatient rehabilitation. Will admit to inpatient rehab today.  Preadmission Screen Completed By:  [name], 08/02/2017 12:19 PM ______________________________________________________________________   Discussed status with Dr. Naaman Plummer on 08/02/17 at 1219 and received telephone approval for admission today.  Admission Coordinator:  [name], time 1219/Date 08/02/17       Cosigned by: Meredith Staggers, MD at 08/02/2017 1:46 PM  Revision History

## 2017-08-02 NOTE — IPOC Note (Signed)
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)   Wounds, multiple   Supratherapeutic INR     Functional Problem List: Nursing Bladder, Bowel, Edema, Endurance, Medication Management, Pain, Safety, Skin Integrity, Motor  PT Balance, Edema, Endurance, Motor, Skin Integrity  OT Balance, Endurance, Motor, Pain, Safety, Skin Integrity  SLP    TR         Basic ADL's: OT Grooming, Bathing, Dressing, Toileting     Advanced  ADL's: OT       Transfers: PT Bed Mobility, Car, Furniture, Bed to Education administrator, Tub/Shower     Locomotion: PT Ambulation, Emergency planning/management officer, Stairs     Additional Impairments: OT None  SLP        TR      Anticipated Outcomes Item Anticipated Outcome  Self Feeding No goal  Swallowing      Basic self-care  Media planner Transfers Supervision - Min assist  Bowel/Bladder  Manage colostomy care with supervision; Continent bladder, no s/s infection, retention.  Transfers  supervision  Locomotion  supervision  Communication     Cognition     Pain  Managed at goal 2/10  Safety/Judgment  Increased safety awareness. No falls, injury this admission.    Therapy Plan: PT Intensity: Minimum of 1-2 x/day ,45 to 90 minutes PT Frequency: 5 out of 7 days PT Duration Estimated Length of Stay: 14-17 days OT Intensity: Minimum of 1-2 x/day, 45 to 90 minutes OT Frequency: 5 out of 7 days OT Duration/Estimated Length of Stay: 18-22 days      Team Interventions: Nursing  Interventions Patient/Family Education, Disease Management/Prevention, Skin Care/Wound Management, Discharge Planning, Bladder Management, Pain Management, Psychosocial Support, Bowel Management, Medication Management  PT interventions Ambulation/gait training, Training and development officer, Community reintegration, Discharge planning, DME/adaptive equipment instruction, Functional mobility training, Neuromuscular re-education, Pain management, Patient/family education, Splinting/orthotics, Stair training, Therapeutic Activities, Therapeutic Exercise, UE/LE Strength taining/ROM, UE/LE Coordination activities, Wheelchair propulsion/positioning  OT Interventions Training and development officer, Academic librarian, Discharge planning, Disease mangement/prevention, Engineer, drilling, Functional mobility training, Neuromuscular re-education, Pain management, Patient/family education, Psychosocial support, Self Care/advanced ADL retraining, Skin care/wound managment, Therapeutic Activities, Therapeutic Exercise, UE/LE Strength taining/ROM, Wheelchair propulsion/positioning  SLP Interventions    TR Interventions    SW/CM Interventions Discharge Planning, Psychosocial Support, Patient/Family Education   Barriers to Discharge MD  Medical stability, IV antibiotics and Wound care  Nursing Hemodialysis, Wound Care    PT Medical stability, Hemodialysis    OT Medical stability, Wound Care, Hemodialysis    SLP      SW       Team Discharge Planning: Destination: PT-Home ,OT- Home , SLP-  Projected Follow-up: PT-Home health PT, OT-  Home health OT, 24 hour supervision/assistance, SLP-  Projected Equipment Needs: PT-To be determined, OT- To be determined, Tub/shower seat, 3 in 1 bedside comode, SLP-  Equipment Details: PT- , OT-  Patient/family involved in discharge planning: PT- Patient, Family member/caregiver,  OT-Patient, Family member/caregiver, SLP-   MD ELOS: 16-20 days. 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 Presented 07/10/2017 with increasing bilateral lower extremity edema and increasing shortness of breath. Patient recently seen in urgent care 2 weeks ago for right groin pain he did receive Flexeril was given a prescription for Bactrim which she did not take. Chest x-ray showed right pleural effusion and right lung base atelectasis versus infiltrate. Unilateral films of the pelvis showed no fracture. CT abdomen and pelvis showed cirrhosis moderate ascites. Echocardiogram with ejection fraction of 73-22% systolic function severely reduced. Blood cultures MSSAplaced on Intravenous cefazolin 6 weeks until 08/21/2017 then convert to chronic suppressive oral cephalexin therapy. TEE completed showing no evidence of endocarditis and no plans to extract ICD device. Renal service follow-up for severe CKD stage IV initially requiring CRRT presently on dialysis. Patient remains on chronic Coumadin therapy. Therapy evaluations completed an ongoing patient limited by fatigue. Will set goals for supervision with PT/OT.  See Team Conference Notes for weekly updates to the plan of care

## 2017-08-02 NOTE — Progress Notes (Signed)
Patient refused CPAP.

## 2017-08-02 NOTE — Progress Notes (Signed)
ANTICOAGULATION CONSULT NOTE - Follow Up Consult  Pharmacy Consult for Coumadin Indication: atrial fibrillation   No Known Allergies  Patient Measurements: Height: 5\' 10"  (177.8 cm) Weight: 154 lb 12.2 oz (70.2 kg) IBW/kg (Calculated) : 73  Vital Signs: Temp: 97.7 F (36.5 C) (08/28 0754) Temp Source: Oral (08/28 0754) BP: 96/57 (08/28 0754) Pulse Rate: 79 (08/28 0754)  Labs:  Recent Labs  07/31/17 0500 08/01/17 0448 08/01/17 0724 08/02/17 0500  HGB 9.0*  --  9.3*  --   HCT 28.3*  --  29.1*  --   PLT 120*  --  143*  --   LABPROT 30.8* 30.4*  --  31.6*  INR 2.88 2.84  --  2.98  CREATININE 3.65*  --  4.09* 3.45*    Estimated Creatinine Clearance: 22 mL/min (A) (by C-G formula based on SCr of 3.45 mg/dL (H)).   Assessment: 60yom continues on coumadin for afib. Dose was held for 3 days due to supratherapeutic INR, resumed 8/26. Today's INR is at goal 2.98. No bleeding.  PTA dose: 2mg  daily except 1mg  Tue/Thur per patient (recent anticoag visit 7/26 his regimen was changed to 1mg  daily except 2mg  Tue/Thur/Sat)  Goal of Therapy:  INR 2-3 Monitor platelets by anticoagulation protocol: Yes   Plan:  1) Coumadin 1mg  tonight 2) Daily INR  Nena Jordan, PharmD, BCPS 08/02/2017 11:51 AM

## 2017-08-02 NOTE — Discharge Summary (Signed)
Physician Discharge Summary  [name] KGM:[id number] DOB: 04-04-1956 DOA: 07/09/2017  PCP: Redmond School, MD  Admit date: 07/09/2017 Discharge date: 08/02/2017  Time spent: 65 minutes  Recommendations for Outpatient Follow-up:  1. Patient to be discharged to CIR when bed available. 2. Patient is being set up for Monday Wednesday Friday hemodialysis schedule via a right brachiocephalic fistula. 3. Follow-up with ID, Dr. Megan Salon post discharge.   Discharge Diagnoses:  Principal Problem:   Staphylococcus aureus bacteremia with sepsis [facility]) Active Problems:   Ischemic cardiomyopathy-EF 35% 04/24/15 echo   Long term current use of anticoagulant therapy   CAD with LCX disease and stents to LAD   Chronic a-fib (HCC)   Chronic kidney disease, stage IV (severe) (HCC)   OSA on CPAP   Hyperlipidemia   BiV ICD (St Jude) gen change 04/10/15   Chronic atrial fibrillation (HCC)   Right heart failure (HCC)   Septic shock (HCC)   Cardiorenal syndrome with renal failure   Infected defibrillator (HCC)   Septic hip (HCC)   Severe sepsis with septic shock ([place])   Bacteremia due to methicillin susceptible Staphylococcus aureus (MSSA)   Acute on chronic systolic CHF (congestive heart failure) (HCC)   ESRD (end stage renal disease) ([place])   Discharge Condition: Stable and improved  Diet recommendation: Renal/heart healthy  Filed Weights   08/01/17 0710 08/01/17 1019 08/02/17 0600  Weight: 67.5 kg (148 lb 13 oz) 65.2 kg (143 lb 11.8 oz) 70.2 kg (154 lb 12.2 oz)    History of present illness:  Per Dr [name] 61 yr old male with extensive PMHx MI, HFrEF EF15%-20% ,BiVICD, CKD Stage IV, DM, GERD, OSA on CPAP, PAF, presented with weight gain, lower extremity swelling and decreased urine output within the last week. Two weeks prior to admission, patient presented to an urgent care when he had pain in his right groin and received flexeril, and ketorolac(last taken the morning of admission) he  was also given a script for Bactrim which he did not take. He stated that his blood pressure runs low normally. His concern was regarding his abdomen and legs.    Hospital Course:  61 y.o.male with history of ischemic cardiomyopathy status post ICD placement. Also history of chronic renal failure. Admitted on 8/4 with acute respiratory failure secondary to pulmonary edema. Subsequently found to have a MSSA bacteremia. Patient has had persistent shock requiring vasopressor support and is on renal replacement therapy. He was initially admitted to [facility] and transferred to [facility] service on 8/20.  Patient now significantly volume overloaded on high-dose oral diuretics and Zaroxolyn with worsening renal function. Patient initially received CVVHD which was subsequently discontinued. Due to worsening renal function patient underwent hemodialysis 07/30/2017 which she tolerated per nephrology. Patient for another hemodialysis session 08/01/2017. Patient is to continue with diuretics as well. Nephrology and heart failure team following.   #1 MSSA bacteremia/sepsis Patient was admitted with septic shock. Patient was pancultured and cultures positive for MSSA. ID consultation was obtained improving clinically. Patient remained afebrile. Afebrile. Repeat blood cultures from 07/11/2017 negative to date.TEE 07/19/2017 negative for endocarditis or vegetation on valves/ICD. Patient noted to be ICD device dependent and there were no plans for ICD extraction for now due to severe comorbidities. Patient status post double-lumen central line and interventional radiology. Patient is to receive 6 weeks of IV antibiotics through 08/20/2017 and then subsequently be converted to chronic suppressive renally dosed oral cephalexin per ID recommendations. Patient will follow-up with ID in the outpatient setting.   #  2 acute on chronic systolic heart failure/cardiogenic shock Patient was admitted to [facility] with worsening volume  overload with increasing edema fatigue and noted to be in shock secondary to sepsis and cardiogenic in nature. Patient was initially placed on pressors per critical care team. Cardiology was consulted. Patient was maintained on levophed and started on a Lasix drip with poor response. Metolazone was also added to patient's regimen and patient was followed by the heart failure team during the hospitalization. Patient underwent a treatment of CVVHD LL during the hospitalization which was stopped on 07/20/2017. Patient remained volume overloaded. Midodrine was added to his regimen and dose increased to 10 mg 3 times daily. Patient was also maintained on high-dose Lasix and urine output was followed. Patient had some diuresis however renal function continued to worsen.  Beta blocker on hold secondary to acute decompensation. Unable to place an ACE inhibitor or ARB due to worsening renal function. Patient subsequently was started on hemodialysis which she tolerated and was -15.006 L during this hospitalization. Patient remained stable with soft blood pressures in the 80s to 100 was asymptomatic and mentating well. Patient was also maintained on Lasix and Zaroxolyn. Zaroxolyn was subsequently discontinued. Patient improved clinically. Patient was followed by the heart failure team and patient will be discharged to inpatient rehabilitation when a bed is available. Patient currently in stable and improved condition.   #3 acute on chronic kidney disease stage IV During the hospitalization patient was noted to be in acute on chronic kidney disease stage IV felt likely to be in part secondary to cardiogenic shock as well as septic shock with poor urine output despite high-dose Lasix. Patient noted to be volume overloaded. It was felt patient likely had cardiorenal syndrome. Patient initially during the hospitalization either presses secondary to hypotension. HD cath was also placed. Patient noted to have a right upper  extremity AV fistula in place. Patient did receive CVVHD early on in the hospitalization which was subsequently discontinued. Patient was maintained on high-dose Lasix as well as Midrin after patient was taken off pressors.  Patient with worsening renal function and creatinine despite diuresis and creatinine was 4.35 on 07/30/2017. Patient underwent hemodialysis 07/30/2017 which he tolerated and per nephrology did well. Creatinine improved somewhat with a creatinine down to 3.45 from 4.09 from 3.65 from 4.35 from 4.10 from 3.83 on 07/28/2017. Patient received another hemodialysis treatment which he tolerated without any complications. Patient will is to be maintained on a Monday Wednesday Friday schedule per nephrology.  Patient did receive CVVHD early on in the hospitalization which has subsequently been discontinued. Patient with a urine output of 550 mL over the past 24 hours on diuretics. 2.5 L of fluid removed during hemodialysis on 07/30/2017. 2.26 L removed during hemodialysis 08/01/2017. Patient is a - 15.006 L during this hospitalization. Blood pressure remained in the 80s-100s, however patient asymptomatic. Patient was maintained on Midrin 10 mg by mouth 3 times a day. Patient remained asymptomatic and blood pressure is likely to be stable with systolics in the 43P to 295J. Patient also maintained on oral Lasix per nephrology. Zaroxolyn has been discontinued per nephrology. Process underway for outpatient dialysis unit placement to [facility]. Patient will be on a Monday Wednesday Friday schedule via a right brachiocephalic fistula. Nephrology and cardiology followed the patient throughout the hospitalization.   #4 cirrhosis Per CT abdomen and pelvis. Patient on diuretics and hemodialysis. Outpatient follow-up.   #5 well-controlled DM 2 Hemoglobin A1c 6.6. CBGs were well controlled during  the hospitalization. Patient was maintained on Lantus and sliding scale insulin.  #6 history of  gout No complaints. Patient was maintained on colchicine during the hospitalization.   #7 anemia Anemia of chronic kidney disease. Hemoglobin stable at 9.3. Status post 2 units packed red blood cells 07/22/2017. Aranesp per nephrology.   #8 chronic atrial fibrillation Rate remained controlled during the hospitalization. INR remained therapeutic. INR on 08/02/2017 was 2.98. Patient was maintained on Coumadin for anticoagulation. Patient was followed by the heart failure team/cardiology during the hospitalization.   #9 H/O rectal CA with diverting colostomy Stable. Outpatient follow-up.  Procedures: TEE 8/14: EF 20% with diffuse hypokinesis. Moderate hypokinesis of RV. ICD/pacing wires noted without vegetation. Left atrium dilated without thrombus. Right atrium dilated and pacing wires without vegetation. Mild aortic insufficiency without vegetation. Mild mitral regurgitation without vegetation. Moderate to severe tricuspid regurgitation without vegetation. Pulmonic valve without vegetation. No PFO or ASD. No pericardial effusion. CT right hip 07/11/2017 CT abdomen and pelvis 07/10/2017 2-D echo 07/10/2017 Transfused 2 units packed red blood cells 07/22/2017 Right IJV tunneled PICC per Dr. Barbie Banner 07/27/2017  SIGNIFICANT EVENTS: 08/04 - Admit 08/05 - Started on CVVHD 08/15 - CVVHD stopped  8/20 - transfer to [facility] service.  8/25--HD  Consultations:  CIR: Dr. Tessa Lerner 07/26/2017   electrophysiology: Dr. Rayann Heman 07/14/2017  Cardiology/heart failure Dr.Qureshi 07/10/2017  Nephrology: Dr. Lorrene Reid 07/11/2017  Infectious disease: Dr. Lennox Medal 07/11/2017  Discharge Exam: Vitals:   08/02/17 0317 08/02/17 0754  BP: (!) 90/53 (!) 96/57  Pulse:  79  Resp:  13  Temp: 98.3 F (36.8 C) 97.7 F (36.5 C)  SpO2:  97%    General: NAD Cardiovascular: RRR WITH 3/6 sem. Trace BLE edema Respiratory: CTAB  Discharge Instructions    Current Discharge Medication List    CONTINUE these  medications which have NOT CHANGED   Details  atorvastatin (LIPITOR) 40 MG tablet Take 1 tablet (40 mg total) by mouth daily. Qty: 30 tablet, Refills: 6    Coenzyme Q10 (COQ10) 50 MG CAPS Take 1 capsule by mouth daily.    collagenase (SANTYL) ointment Apply topically daily. Qty: 15 g, Refills: 0    ferrous sulfate 325 (65 FE) MG tablet Take 1 tablet (325 mg total) by mouth 3 (three) times daily with meals. Qty: 90 tablet, Refills: 0    glucose blood test strip Use to test blood sugar 3 times daily Qty: 100 each, Refills: 4    insulin aspart (NOVOLOG FLEXPEN) 100 UNIT/ML FlexPen 5 units before each meal Qty: 15 mL, Refills: 1    metolazone (ZAROXOLYN) 2.5 MG tablet take 1 tablet by mouth weekly if needed for SWELLING Qty: 12 tablet, Refills: 3    metoprolol succinate (TOPROL-XL) 25 MG 24 hr tablet Take 0.5 tablets (12.5 mg total) by mouth 2 (two) times daily. Qty: 45 tablet, Refills: 3   Associated Diagnoses: Chronic systolic heart failure (HCC)    nitroGLYCERIN (NITROSTAT) 0.4 MG SL tablet Place 1 tablet (0.4 mg total) under the tongue every 5 (five) minutes as needed for chest pain. Qty: 25 tablet, Refills: 3    pantoprazole (PROTONIX) 40 MG tablet Take 1 tablet (40 mg total) by mouth daily. Qty: 30 tablet, Refills: 0    polyethylene glycol (MIRALAX / GLYCOLAX) packet Take 17 g by mouth daily as needed for mild constipation. Qty: 14 each, Refills: 0    potassium chloride SA (K-DUR,KLOR-CON) 20 MEQ tablet Take 2 tablets (40 mEq total) by mouth daily. Qty: 60 tablet, Refills:  6    PROAIR RESPICLICK 607 (90 BASE) MCG/ACT AEPB Inhale 1 puff into the lungs every 6 (six) hours as needed (for breathing).  Refills: 0    torsemide (DEMADEX) 20 MG tablet TAKE 4 TABLETS IN THE MORNING AND 2 TABLET IN THE EVENING Qty: 180 tablet, Refills: 6    warfarin (COUMADIN) 2 MG tablet Take 1 tablet daily except 1/2 tablet on Tuesdays and Fridays Qty: 30 tablet, Refills: 3    Insulin  Glargine (LANTUS SOLOSTAR) 100 UNIT/ML Solostar Pen Inject 15 Units into the skin daily at 10 pm. Qty: 5 pen, Refills: 3       No Known Allergies Follow-up Information    Michel Bickers, MD. Schedule an appointment as soon as possible for a visit.   Specialty:  Infectious Diseases Why:  Office will call with appointment. Contact information: [address] [place] [address] [phone]            The results of significant diagnostics from this hospitalization (including imaging, microbiology, ancillary and laboratory) are listed below for reference.    Significant Diagnostic Studies: Ct Abdomen Pelvis Wo Contrast  Result Date: 07/10/2017 CLINICAL DATA:  Abdominal pain. Swelling. Assess for diverticulitis, hematoma or abscess. History of cirrhosis, diabetes, colostomy and rectal cancer, prostate cancer, anemia. EXAM: CT ABDOMEN AND PELVIS WITHOUT CONTRAST TECHNIQUE: Multidetector CT imaging of the abdomen and pelvis was performed following the standard protocol without IV contrast. COMPARISON:  CT abdomen and pelvis May 05, 2017 FINDINGS: LOWER CHEST: Moderate RIGHT pleural effusion, small LEFT pleural effusion. The heart is mildly enlarged. Severe coronary artery calcifications and/or stents. Cardiac pacemaker wires in place. Bilateral lower lobe atelectasis. Punctate granulomas RIGHT lower lobe. HEPATOBILIARY: Mildly nodular liver contour compatible with history of cirrhosis. Subcentimeter gallstones, contracted gallbladder. PANCREAS: Normal. No intraperitoneal free air. SPLEEN: Normal. ADRENALS/URINARY TRACT: Kidneys are orthotopic, demonstrating normal size and morphology. 3 mm LEFT upper pole, 3 mm RIGHT interpolar nephrolithiasis. Additional punctate nephrolithiasis. No hydronephrosis; limited assessment for renal masses on this nonenhanced examination. The unopacified ureters are normal in course and caliber. Urinary bladder is decompressed containing a Foley  catheter. Normal adrenal glands. STOMACH/BOWEL: Similarly thickened rectum with presacral soft tissue stranding. The stomach, small bowel are normal in course and caliber without inflammatory changes, sensitivity decreased by lack of enteric contrast. LEFT lower quadrant diverting colostomy. Normal appendix. VASCULAR/LYMPHATIC: Aortoiliac vessels are normal in course and caliber. Moderate aortic atherosclerosis. No lymphadenopathy by CT size criteria. REPRODUCTIVE: Prostate brachia therapy seeds. OTHER: Moderate volume ascites. MUSCULOSKELETAL: Anasarca. Sacroiliac ankylosis. Anterior abdominal wall scarring. Similar sacral sclerosis pre ring post radiation change. IMPRESSION: 1. Cirrhosis.  Moderate ascites.  Anasarca. 2. Diverting colostomy without bowel obstruction. Similar thickened rectum compatible with treatment related changes. 3. Mild cardiomegaly. Moderate RIGHT and small LEFT pleural effusions. Aortic Atherosclerosis (ICD10-I70.0). Electronically Signed   By: Elon Alas M.D.   On: 07/10/2017 19:42   Ct Hip Right Wo Contrast  Result Date: 07/12/2017 CLINICAL DATA:  Extreme right hip pain and bilateral lower extremity swelling without known injury. EXAM: CT OF THE RIGHT HIP WITHOUT CONTRAST TECHNIQUE: Multidetector CT imaging of the right hip was performed according to the standard protocol. Multiplanar CT image reconstructions were also generated. COMPARISON:  CT abdomen and pelvis from 1 day prior. FINDINGS: Bones/Joint/Cartilage Osteopenic appearance of the included pelvis and right hip without fracture or bone destruction. There is lower lumbar facet arthropathy with facet joint space narrowing, sclerosis and hypertrophy. No flattening of the included  femoral heads. No joint dislocations. Hypertrophic degenerative changes are seen of both acetabular roofs. Small sclerotic focus of the left acetabular roof is noted, nonspecific possibly a bone island in isolation. No other osseous lesions are  apparent. Right-sided gluteal calcific tendinopathy. Ligaments Suboptimally assessed by CT. Muscles and Tendons Generalized muscle atrophy without intramuscular hemorrhage or mass its. Soft tissues There is diffuse anasarca with abdominopelvic ascites. Aortoiliofemoral and branch vessel atherosclerosis without aneurysm. Large intrascrotal hydroceles. Brachytherapy seeds in the prostate. IMPRESSION: 1. No acute fracture nor dislocation of either hip. No evidence of AVN. Probable right-sided gluteal tendinopathy. 2. The bones are demineralized in appearance. There is generalized muscle atrophy about the right hip. 3. Anasarca with moderate abdominopelvic ascites. 4. Moderate to large bilateral hydroceles. Electronically Signed   By: Feighner Royalty M.D.   On: 07/12/2017 03:04   Ir Fluoro Guide Cv Line Right  Result Date: 07/28/2017 INDICATION: IV access required for infection EXAM: RIGHT JUGULAR TUNNELED PICC LINE PLACEMENT WITH ULTRASOUND AND FLUOROSCOPIC GUIDANCE MEDICATIONS: None ANESTHESIA/SEDATION: None FLUOROSCOPY TIME:  Fluoroscopy Time:  minutes 48 seconds (6 mGy). COMPLICATIONS: None immediate. PROCEDURE: The patient was advised of the possible risks and complications and agreed to undergo the procedure. The patient was then brought to the angiographic suite for the procedure. The right neck was prepped with chlorhexidine, draped in the usual sterile fashion using maximum barrier technique (cap and mask, sterile gown, sterile gloves, large sterile sheet, hand hygiene and cutaneous antiseptic). Local anesthesia was attained by infiltration with 1% lidocaine. Ultrasound demonstrated patency of the right jugular vein, and this was documented with an image. Under real-time ultrasound guidance, this vein was accessed with a 21 gauge micropuncture needle and image documentation was performed. The needle was exchanged over a guidewire for a peel-away sheath through which a 20 cm 6 [place] double lumen power  injectable PICC was advanced, and positioned with its tip at the lower SVC/right atrial junction. The cuff was positioned in the subcutaneous tract. Fluoroscopy during the procedure and fluoro spot radiograph confirms appropriate catheter position. The catheter was flushed, secured to the skin with Prolene sutures, and covered with a sterile dressing. IMPRESSION: Successful placement of a right jugular tunneled PICC with sonographic and fluoroscopic guidance. The catheter is ready for use. Electronically Signed   By: Marybelle Killings M.D.   On: 07/28/2017 11:24   Ir US Guide Vasc Access Right  Result Date: 07/28/2017 INDICATION: IV access required for infection EXAM: RIGHT JUGULAR TUNNELED PICC LINE PLACEMENT WITH ULTRASOUND AND FLUOROSCOPIC GUIDANCE MEDICATIONS: None ANESTHESIA/SEDATION: None FLUOROSCOPY TIME:  Fluoroscopy Time:  minutes 48 seconds (6 mGy). COMPLICATIONS: None immediate. PROCEDURE: The patient was advised of the possible risks and complications and agreed to undergo the procedure. The patient was then brought to the angiographic suite for the procedure. The right neck was prepped with chlorhexidine, draped in the usual sterile fashion using maximum barrier technique (cap and mask, sterile gown, sterile gloves, large sterile sheet, hand hygiene and cutaneous antiseptic). Local anesthesia was attained by infiltration with 1% lidocaine. Ultrasound demonstrated patency of the right jugular vein, and this was documented with an image. Under real-time ultrasound guidance, this vein was accessed with a 21 gauge micropuncture needle and image documentation was performed. The needle was exchanged over a guidewire for a peel-away sheath through which a 20 cm 6 [place] double lumen power injectable PICC was advanced, and positioned with its tip at the lower SVC/right atrial junction. The cuff was positioned in the subcutaneous tract. Fluoroscopy  during the procedure and fluoro spot radiograph confirms  appropriate catheter position. The catheter was flushed, secured to the skin with Prolene sutures, and covered with a sterile dressing. IMPRESSION: Successful placement of a right jugular tunneled PICC with sonographic and fluoroscopic guidance. The catheter is ready for use. Electronically Signed   By: Marybelle Killings M.D.   On: 07/28/2017 11:24   Dg Chest Port 1 View  Result Date: 07/24/2017 CLINICAL DATA:  Acute respiratory failure. EXAM: PORTABLE CHEST 1 VIEW COMPARISON:  July 11, 2017 FINDINGS: Stable support apparatus. No pneumothorax. Persistent but decreasing right effusion with underlying atelectasis. Stable cardiomediastinal silhouette. No overt edema. IMPRESSION: Improving effusion on the right.  No other interval change. Electronically Signed   By: Dorise Bullion III M.D   On: 07/24/2017 07:14   Dg Chest Port 1 View  Result Date: 07/11/2017 CLINICAL DATA:  Short of breath EXAM: PORTABLE CHEST 1 VIEW COMPARISON:  07/10/2017 FINDINGS: Tubular devices are stable. Left subclavian AICD device with leads is stable. Opacity throughout the right lung dx secondary to pleural and parenchymal disease is stable. Left lung is relatively clear would low volumes. No pneumothorax. IMPRESSION: Stable pleural and parenchymal disease in the right lung. Electronically Signed   By: Marybelle Killings M.D.   On: 07/11/2017 07:23   Dg Chest Port 1 View  Result Date: 07/10/2017 CLINICAL DATA:  61 year old male central line placement. EXAM: PORTABLE CHEST 1 VIEW COMPARISON:  07/09/2017 and earlier. FINDINGS: Portable AP semi upright view at 1601 hours. Right IJ approach dual lumen central venous catheter placed, and courses along side the left subclavian approach cardiac AICD leads. Catheter tips are probably at the level of the carina. No pneumothorax. Continued veiling opacity in the right lung. Stable cardiac size and mediastinal contours. Visualized tracheal air column is within normal limits. Stable and satisfactory left  lung ventilation. IMPRESSION: 1. Dual-lumen right IJ central catheter tips partially obscured by the AICD leads but at the SVC level. 2. Continued right pleural effusion. No new cardiopulmonary abnormality. Electronically Signed   By: Genevie Ann M.D.   On: 07/10/2017 16:16   Dg Chest Portable 1 View  Result Date: 07/09/2017 CLINICAL DATA:  61 year old male with shortness of breath. EXAM: PORTABLE CHEST 1 VIEW COMPARISON:  Chest radiograph dated 06/13/2017 FINDINGS: There is a small right pleural effusion with right lung base airspace density which may represent atelectasis versus infiltrate. Overall the size of the pleural effusion and right lung base density appears relatively similar to prior radiograph. The left lung is clear. There is no pneumothorax. Stable cardiac silhouette. Left pectoral AICD device. No acute osseous pathology. IMPRESSION: Right pleural effusion and right lung base atelectasis versus infiltrate. Clinical correlation is recommended. Electronically Signed   By: Anner Crete M.D.   On: 07/09/2017 23:11   Dg Hip Unilat With Pelvis 2-3 Views Right  Result Date: 07/10/2017 CLINICAL DATA:  61 year old male with right hip pain. No known injury. EXAM: DG HIP (WITH OR WITHOUT PELVIS) 2-3V RIGHT COMPARISON:  CT of the abdomen pelvis dated 05/05/2016 FINDINGS: There is no acute fracture or dislocation. The bones are mildly osteopenic. There is mild osteoarthritic changes of the hips. Multiple metallic biopsy clips noted in the region of the prostate gland. The soft tissues appear unremarkable with IMPRESSION: No acute fracture or dislocation. Mild arthritic changes. Electronically Signed   By: Anner Crete M.D.   On: 07/10/2017 02:42    Microbiology: No results found for this or any previous visit (from  the past 240 hour(s)).   Labs: Basic Metabolic Panel:  Recent Labs Lab 07/27/17 0356 07/28/17 0430 07/29/17 0456 07/30/17 0452 07/30/17 0740 07/31/17 0500 08/01/17 0724  08/02/17 0500  NA 131* 129* 130*  --  132* 133* 131* 132*  K 3.4* 4.3 3.8  --  3.6 3.5 3.3* 3.6  CL 98* 96* 96*  --  96* 96* 94* 95*  CO2 23 22 22   --  23 24 23 26   GLUCOSE 104* 137* 110*  --  100* 75 137* 100*  BUN 55* 61* 70*  --  74* 54* 59* 44*  CREATININE 3.44* 3.83* 4.10*  --  4.35* 3.65* 4.09* 3.45*  CALCIUM 8.4* 8.6* 8.8*  --  8.7* 8.2* 8.6* 8.4*  MG 1.9 1.9 2.3 2.2  --  2.0  --   --   PHOS 6.0* 6.3* 6.3*  --  6.7* 5.5* 5.9* 5.0*   Liver Function Tests:  Recent Labs Lab 07/29/17 0456 07/30/17 0740 07/31/17 0500 08/01/17 0724 08/02/17 0500  AST  --   --   --  20  --   ALT  --   --   --  <5*  --   ALKPHOS  --   --   --  106  --   BILITOT  --   --   --  0.8  --   PROT  --   --   --  6.7  --   ALBUMIN 1.6* 1.6* 1.8* 1.8* 1.9*   No results for input(s): LIPASE, AMYLASE in the last 168 hours. No results for input(s): AMMONIA in the last 168 hours. CBC:  Recent Labs Lab 07/28/17 0430 07/29/17 0456 07/30/17 0452 07/31/17 0500 08/01/17 0724  WBC 7.2 5.7 6.4 6.3 7.0  HGB 8.7* 8.5* 8.9* 9.0* 9.3*  HCT 27.6* 26.9* 27.9* 28.3* 29.1*  MCV 90.5 90.9 90.9 94.0 93.3  PLT 113* 102* 116* 120* 143*   Cardiac Enzymes: No results for input(s): CKTOTAL, CKMB, CKMBINDEX, TROPONINI in the last 168 hours. BNP: BNP (last 3 results)  Recent Labs  06/13/17 1205 07/09/17 2151  BNP 1,151.0* 1,553.9*    ProBNP (last 3 results) No results for input(s): PROBNP in the last 8760 hours.  CBG:  Recent Labs Lab 07/31/17 2019 08/01/17 1159 08/01/17 1649 08/01/17 2108 08/02/17 0753  GLUCAP 142* 100* 124* 135* 89       Signed:  THOMPSON,DANIEL MD.  [facility] 08/02/2017, 11:01 AM

## 2017-08-02 NOTE — Progress Notes (Addendum)
Pt admitted to room 4W19 from [facility] via bed. Alert and oriented x4, VSS, reports 5/10 pain at present. NSL LUE with site red, hardened, with purulent drainage present, d/ced. Multi skin issues present, see assessment FS. Air mattress ordered per protocol.  Oriented to room and rehab process.

## 2017-08-02 NOTE — Consult Note (Signed)
   [facility] CM Inpatient Consult   08/02/2017  [name] October 18, 1956 [id number]  Update: ELOS  Patient and family are planning on inpatient rehab at [facility] when medically stable. Following for progression and disposition needs. For questions, please contact:  Natividad Brood, RN BSN [facility] Liaison  [phone] business mobile phone Toll free office [phone]

## 2017-08-03 ENCOUNTER — Inpatient Hospital Stay (HOSPITAL_COMMUNITY): Payer: Medicare Other | Admitting: Physical Therapy

## 2017-08-03 ENCOUNTER — Inpatient Hospital Stay (HOSPITAL_COMMUNITY): Payer: Medicare Other | Admitting: Occupational Therapy

## 2017-08-03 ENCOUNTER — Inpatient Hospital Stay (HOSPITAL_COMMUNITY): Payer: Medicare Other

## 2017-08-03 DIAGNOSIS — D62 Acute posthemorrhagic anemia: Secondary | ICD-10-CM

## 2017-08-03 DIAGNOSIS — I5022 Chronic systolic (congestive) heart failure: Secondary | ICD-10-CM

## 2017-08-03 DIAGNOSIS — R7881 Bacteremia: Secondary | ICD-10-CM

## 2017-08-03 DIAGNOSIS — E1142 Type 2 diabetes mellitus with diabetic polyneuropathy: Secondary | ICD-10-CM

## 2017-08-03 DIAGNOSIS — I951 Orthostatic hypotension: Secondary | ICD-10-CM

## 2017-08-03 DIAGNOSIS — N184 Chronic kidney disease, stage 4 (severe): Secondary | ICD-10-CM

## 2017-08-03 DIAGNOSIS — R791 Abnormal coagulation profile: Secondary | ICD-10-CM

## 2017-08-03 DIAGNOSIS — Z992 Dependence on renal dialysis: Secondary | ICD-10-CM

## 2017-08-03 DIAGNOSIS — T07XXXA Unspecified multiple injuries, initial encounter: Secondary | ICD-10-CM

## 2017-08-03 DIAGNOSIS — I2510 Atherosclerotic heart disease of native coronary artery without angina pectoris: Secondary | ICD-10-CM

## 2017-08-03 DIAGNOSIS — D638 Anemia in other chronic diseases classified elsewhere: Secondary | ICD-10-CM

## 2017-08-03 DIAGNOSIS — R5381 Other malaise: Principal | ICD-10-CM

## 2017-08-03 DIAGNOSIS — Z433 Encounter for attention to colostomy: Secondary | ICD-10-CM

## 2017-08-03 DIAGNOSIS — E43 Unspecified severe protein-calorie malnutrition: Secondary | ICD-10-CM

## 2017-08-03 LAB — CBC
HCT: 31.7 % — ABNORMAL LOW (ref 39.0–52.0)
HCT: 29.8 % — ABNORMAL LOW (ref 39.0–52.0)
HEMOGLOBIN: 9.8 g/dL — AB (ref 13.0–17.0)
Hemoglobin: 9.6 g/dL — ABNORMAL LOW (ref 13.0–17.0)
MCH: 29.5 pg (ref 26.0–34.0)
MCH: 30.4 pg (ref 26.0–34.0)
MCHC: 30.9 g/dL (ref 30.0–36.0)
MCHC: 32.2 g/dL (ref 30.0–36.0)
MCV: 95.5 fL (ref 78.0–100.0)
MCV: 94.3 fL (ref 78.0–100.0)
PLATELETS: 150 10*3/uL (ref 150–400)
Platelets: 151 10*3/uL (ref 150–400)
RBC: 3.32 MIL/uL — ABNORMAL LOW (ref 4.22–5.81)
RBC: 3.16 MIL/uL — ABNORMAL LOW (ref 4.22–5.81)
RDW: 19.1 % — AB (ref 11.5–15.5)
RDW: 19.5 % — ABNORMAL HIGH (ref 11.5–15.5)
WBC: 8.4 10*3/uL (ref 4.0–10.5)
WBC: 8.6 10*3/uL (ref 4.0–10.5)

## 2017-08-03 LAB — PROTIME-INR
INR: 3.4
Prothrombin Time: 34.1 seconds — ABNORMAL HIGH (ref 11.4–15.2)

## 2017-08-03 LAB — RENAL FUNCTION PANEL
ALBUMIN: 1.8 g/dL — AB (ref 3.5–5.0)
ANION GAP: 11 (ref 5–15)
Albumin: 1.8 g/dL — ABNORMAL LOW (ref 3.5–5.0)
Anion gap: 11 (ref 5–15)
BUN: 48 mg/dL — AB (ref 6–20)
BUN: 54 mg/dL — ABNORMAL HIGH (ref 6–20)
CALCIUM: 8.6 mg/dL — AB (ref 8.9–10.3)
CO2: 26 mmol/L (ref 22–32)
CO2: 26 mmol/L (ref 22–32)
CREATININE: 3.95 mg/dL — AB (ref 0.61–1.24)
Calcium: 8.5 mg/dL — ABNORMAL LOW (ref 8.9–10.3)
Chloride: 92 mmol/L — ABNORMAL LOW (ref 101–111)
Chloride: 92 mmol/L — ABNORMAL LOW (ref 101–111)
Creatinine, Ser: 4.26 mg/dL — ABNORMAL HIGH (ref 0.61–1.24)
GFR calc Af Amer: 17 mL/min — ABNORMAL LOW (ref >60)
GFR calc Af Amer: 16 mL/min — ABNORMAL LOW (ref >60)
GFR calc non Af Amer: 15 mL/min — ABNORMAL LOW (ref >60)
GFR calc non Af Amer: 14 mL/min — ABNORMAL LOW (ref >60)
GLUCOSE: 123 mg/dL — AB (ref 65–99)
Glucose, Bld: 97 mg/dL (ref 65–99)
PHOSPHORUS: 5 mg/dL — AB (ref 2.5–4.6)
Phosphorus: 5.5 mg/dL — ABNORMAL HIGH (ref 2.5–4.6)
Potassium: 3.3 mmol/L — ABNORMAL LOW (ref 3.5–5.1)
Potassium: 3.6 mmol/L (ref 3.5–5.1)
SODIUM: 129 mmol/L — AB (ref 135–145)
Sodium: 129 mmol/L — ABNORMAL LOW (ref 135–145)

## 2017-08-03 LAB — GLUCOSE, CAPILLARY
Glucose-Capillary: 72 mg/dL (ref 65–99)
Glucose-Capillary: 88 mg/dL (ref 65–99)
Glucose-Capillary: 98 mg/dL (ref 65–99)

## 2017-08-03 MED ORDER — MIDODRINE HCL 5 MG PO TABS
10.0000 mg | ORAL_TABLET | Freq: Once | ORAL | Status: AC
  Administered 2017-08-03: 10 mg via ORAL

## 2017-08-03 MED ORDER — MIDODRINE HCL 5 MG PO TABS
ORAL_TABLET | ORAL | Status: AC
  Administered 2017-08-03: 10 mg via ORAL
  Filled 2017-08-03: qty 10

## 2017-08-03 MED ORDER — POTASSIUM CHLORIDE CRYS ER 20 MEQ PO TBCR
20.0000 meq | EXTENDED_RELEASE_TABLET | Freq: Once | ORAL | Status: AC
  Administered 2017-08-03: 20 meq via ORAL
  Filled 2017-08-03: qty 1

## 2017-08-03 MED ORDER — PRO-STAT SUGAR FREE PO LIQD
30.0000 mL | Freq: Two times a day (BID) | ORAL | Status: DC
  Administered 2017-08-03 – 2017-08-19 (×32): 30 mL via ORAL
  Filled 2017-08-03 (×33): qty 30

## 2017-08-03 MED ORDER — COLLAGENASE 250 UNIT/GM EX OINT
1.0000 "application " | TOPICAL_OINTMENT | Freq: Every day | CUTANEOUS | Status: DC
  Administered 2017-08-03 – 2017-08-20 (×18): 1 via TOPICAL
  Filled 2017-08-03 (×3): qty 30

## 2017-08-03 MED ORDER — HYDROCERIN EX CREA
TOPICAL_CREAM | Freq: Two times a day (BID) | CUTANEOUS | Status: DC
  Administered 2017-08-03 – 2017-08-19 (×25): via TOPICAL
  Filled 2017-08-03 (×2): qty 113

## 2017-08-03 MED ORDER — DARBEPOETIN ALFA 200 MCG/0.4ML IJ SOSY
PREFILLED_SYRINGE | INTRAMUSCULAR | Status: AC
  Administered 2017-08-03: 200 ug via SUBCUTANEOUS
  Filled 2017-08-03: qty 0.4

## 2017-08-03 NOTE — Progress Notes (Signed)
Initial Nutrition Assessment  DOCUMENTATION CODES:   Not applicable  INTERVENTION:  Continue Ensure Enlive po BID, each supplement provides 350 kcal and 20 grams of protein  Continue 30 ml Prostat po BID, each supplement provides 100 kcal and 15 grams of protein.   NUTRITION DIAGNOSIS:   Increased nutrient needs related to chronic illness, wound healing as evidenced by estimated needs.  GOAL:   Patient will meet greater than or equal to 90% of their needs  MONITOR:   PO intake, Supplement acceptance, Labs, Weight trends, Skin, I & O's  REASON FOR ASSESSMENT:   Malnutrition Screening Tool    ASSESSMENT:   61 y.o. 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  Presented 07/10/2017 with increasing bilateral lower extremity edema and increasing shortness of breath. Chest x-ray showed right pleural effusion and right lung base atelectasis versus infiltrate. CT abdomen and pelvis showed cirrhosis moderate ascites. Echocardiogram with ejection fraction of 33-61% systolic function severely reduced. TEE completed showing no evidence of endocarditis and no plans to extract ICD device. Renal service follow-up for severe CKD stage IV initially requiring CRRT and latest creatinine 3.45 and presently on dialysis.   Pt was unavailable, busy with RN, during time of visit. Meal completion has been 90-100%. Per weight records, pt with a 10.9% weight loss in 1 month. Weight loss likely related to fluid status. Pt currently has Ensure and Prostat ordered and has been consuming them. Per RD note 8/27, pt requested chocolate flavor. RD to continue with current orders to aid in adequate  nutrition and wound healing.   Unable to complete Nutrition-Focused physical exam at this time. RD to perform physical exam at next visit.   Labs and medications reviewed. Phosphorous elevated at 5.0.  Diet Order:  Diet renal/carb modified with fluid restriction Diet-HS Snack? Nothing; Room service appropriate? Yes; Fluid consistency: Thin; Fluid restriction: 1200 mL Fluid  Skin:  Wound (see comment) (lower extremity wounds)  Last BM:  8/29  Height:   Ht Readings from Last 1 Encounters:  07/24/17 5\' 10"  (1.778 m)    Weight:   Wt Readings from Last 1 Encounters:  08/03/17 155 lb 4.4 oz (70.4 kg)    Ideal Body Weight:  75.45 kg  BMI:  Body mass index is 22.28 kg/m.  Estimated Nutritional Needs:   Kcal:  2100-2300  Protein:  105-120 grams  Fluid:  1.2 L/day  EDUCATION NEEDS:   No education needs identified at this time  Corrin Parker, MS, RD, LDN Pager # [phone] After hours/ weekend pager # [phone]

## 2017-08-03 NOTE — Progress Notes (Signed)
Advanced Heart Failure Rounding Note  PCP:  Primary Cardiologist: Dr. Haroldine Laws   Subjective:    Feeling great. Already progressing with therapy. Pt and wife very proud of his accomplishments today.  They are overall optimistic. Denies SOB or CP. Denies lightheadedness or dizziness.   INR 3.4   Objective:   Weight Range: 155 lb 4.4 oz (70.4 kg) Body mass index is 22.28 kg/m.   Vital Signs:   Temp:  [97.6 F (36.4 C)] 97.6 F (36.4 C) (08/29 0631) Pulse Rate:  [70-74] 70 (08/29 0834) Resp:  [14-18] 14 (08/29 0834) BP: (94-103)/(52-60) 103/52 (08/29 0834) SpO2:  [98 %-100 %] 98 % (08/29 0834) Weight:  [155 lb 4.4 oz (70.4 kg)] 155 lb 4.4 oz (70.4 kg) (08/29 0535) Last BM Date: 08/03/17  Weight change: Filed Weights   08/03/17 0535  Weight: 155 lb 4.4 oz (70.4 kg)    Intake/Output:   Intake/Output Summary (Last 24 hours) at 08/03/17 1258 Last data filed at 08/03/17 0848  Gross per 24 hour  Intake              470 ml  Output             1050 ml  Net             -580 ml      Physical Exam    General:  Well appearing. No resp difficulty HEENT: Normal Neck: Supple. JVP 8-9 cm. Carotids 2+ bilat; no bruits. No lymphadenopathy or thyromegaly appreciated. Cor: PMI nondisplaced. Regular rate & rhythm. No rubs, gallops or murmurs. Lungs: Clear Abdomen: Soft, nontender, nondistended. No hepatosplenomegaly. No bruits or masses. Good bowel sounds. Extremities: No cyanosis, clubbing, or rash. RUE fistula edema. BLE 1-2+ edema. Neuro: Alert & orientedx3, cranial nerves grossly intact. moves all 4 extremities w/o difficulty. Affect pleasant   Telemetry   Not connected  EKG    N/A  Labs    CBC  Recent Labs  08/02/17 1840 08/03/17 0843  WBC 9.9 8.4  HGB 9.2* 9.8*  HCT 29.7* 31.7*  MCV 95.5 95.5  PLT 143* 517   Basic Metabolic Panel  Recent Labs  08/02/17 1843 08/03/17 0843  NA 132* 129*  K 3.7 3.3*  CL 94* 92*  CO2 27 26  GLUCOSE 157* 123*    BUN 45* 48*  CREATININE 3.57* 3.95*  CALCIUM 8.7* 8.6*  PHOS 5.0* 5.0*   Liver Function Tests  Recent Labs  08/01/17 0724  08/02/17 1843 08/03/17 0843  AST 20  --   --   --   ALT <5*  --   --   --   ALKPHOS 106  --   --   --   BILITOT 0.8  --   --   --   PROT 6.7  --   --   --   ALBUMIN 1.8*  < > 1.8* 1.8*  < > = values in this interval not displayed. No results for input(s): LIPASE, AMYLASE in the last 72 hours. Cardiac Enzymes No results for input(s): CKTOTAL, CKMB, CKMBINDEX, TROPONINI in the last 72 hours.  BNP: BNP (last 3 results)  Recent Labs  06/13/17 1205 07/09/17 2151  BNP 1,151.0* 1,553.9*    ProBNP (last 3 results) No results for input(s): PROBNP in the last 8760 hours.   D-Dimer No results for input(s): DDIMER in the last 72 hours. Hemoglobin A1C No results for input(s): HGBA1C in the last 72 hours. Fasting Lipid Panel No results for  input(s): CHOL, HDL, LDLCALC, TRIG, CHOLHDL, LDLDIRECT in the last 72 hours. Thyroid Function Tests No results for input(s): TSH, T4TOTAL, T3FREE, THYROIDAB in the last 72 hours.  Invalid input(s): FREET3  Other results:   Imaging     No results found.   Medications:     Scheduled Medications: . colchicine  0.3 mg Oral Daily  . collagenase  1 application Topical Daily  . darbepoetin (ARANESP) injection - NON-DIALYSIS  200 mcg Subcutaneous Q Wed-1800  . feeding supplement (ENSURE ENLIVE)  237 mL Oral BID BM  . feeding supplement (PRO-STAT SUGAR FREE 64)  30 mL Oral BID  . hydrocerin   Topical BID  . insulin aspart  0-5 Units Subcutaneous QHS  . insulin aspart  3 Units Subcutaneous TID WC  . insulin glargine  10 Units Subcutaneous QHS  . midodrine  10 mg Oral TID WC  . multivitamin  1 tablet Oral QHS  . sucroferric oxyhydroxide  500 mg Oral TID WC  . Warfarin - Pharmacist Dosing Inpatient   Does not apply q1800     Infusions: .  ceFAZolin (ANCEF) IV Stopped (08/02/17 2221)     PRN  Medications:  acetaminophen, alum & mag hydroxide-simeth, heparin, HYDROcodone-acetaminophen, ondansetron **OR** ondansetron (ZOFRAN) IV, sodium chloride flush, sorbitol    Patient Profile   Mr Garner is a 61 year old with a history of DMII, [place] 2016, rectal cancer, CKD Stage IV, chronic A fib, S/P AVN ablation, PAF, CAD BMS LAD 2011 BMS LAD 2009, OSA, chronic systolic heart failure, [employer] Jude BiV ICD   Previously admitted with AKI and shock with elevated lactate, suspect cardiogenic.   Moved to CIR 08/02/17.  Assessment/Plan   1. Acute on chronic systolic CHF/cardiogenic shock: Last echo in 8/17 with EF 15-20%.  Ischemic cardiomyopathy, has [employer] Jude BiV ICD (despite chronic afib appears to be effectively BiV pacing).  Had recent admission (discharged 06/21/17) requiring milrinone support and diuresis.  Admitted with soft BP (SBP 80s-90s) + elevated lactate.  Suspect mixed septic/cardiogenic shock with MSSA bacteremia.  - CVVHD stopped 07/20/17. Now on iHD.  - Volume much improved on iHD. Continue to follow closely.  - No beta blocker with decompensation, hypotension.  - No ARB with AKI.   2. AKI on CKD stage 4:  -Tolerating iHD.  - Per Nephrology. Appreciate their management of patient.   3. Atrial fibrillation: Chronic - Continue coumadin. No plans to extract device.  - INR 3.40. Dosing per pharm.   4. CAD:  - No s/s ischemia.   - Continue ASA. No statin with recent elevated LFT's    5. ID:  - Blood CX 2/2 MSSA. Antibiotics narrowed, now on Ancef. 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   - Not planning ICD extraction for now due to severe comorbidities. He is device dependent.  - TEE 07/19/17 with no evidence of endocarditis.  - Per ID continue cefazolin until 9/15 the start renally-dosed cephalexin. PICC in place.   6. H/O Rectal CA with diverting colostomy   7. Type II diabetes:  - Per primary.   8. Liver failure:  - LFTs resolved on initial admit.  Resolved.    9. Anemia of chronic disease. - s/p RBCs on 8/17.  - No bleeding problems.     10. Deconditioning--  - Appreciate CIR. Planning on minimum 2 weeks therapy.    Length of Stay: 1   Annamaria Helling  08/03/2017, 12:58 PM  Advanced Heart Failure Team Pager  [phone] (M-F; 7a - 4p)  Please contact [facility] for night-coverage after hours (4p -7a ) and weekends on [email or web address]   Patient seen and examined with the above-signed Advanced Practice Provider and/or Housestaff. I personally reviewed laboratory data, imaging studies and relevant notes. I independently examined the patient and formulated the important aspects of the plan. I have edited the note to reflect any of my changes or salient points. I have personally discussed the plan with the patient and/or family.  Tolerating HD. Getting stronger with CIR. INR high - will adjust. BP too soft to restart HF meds.   Continue current plan.   Glori Bickers, MD  4:32 PM

## 2017-08-03 NOTE — Progress Notes (Signed)
Patient information reviewed and entered into eRehab system by Weda Baumgarner, RN, CRRN, PPS Coordinator.  Information including medical coding and functional independence measure will be reviewed and updated through discharge.     Per nursing patient was given "Data Collection Information Summary for Patients in Inpatient Rehabilitation Facilities with attached "Privacy Act Statement-Health Care Records" upon admission.  

## 2017-08-03 NOTE — Progress Notes (Signed)
ANTICOAGULATION CONSULT NOTE - Follow Up Consult  Pharmacy Consult for Coumadin Indication: atrial fibrillation   No Known Allergies  Patient Measurements: Weight: 155 lb 4.4 oz (70.4 kg)  Vital Signs: Temp: 97.6 F (36.4 C) (08/29 0631) Temp Source: Oral (08/29 0631) BP: 103/52 (08/29 0834) Pulse Rate: 70 (08/29 0834)  Labs:  Recent Labs  08/01/17 0448  08/01/17 0724 08/02/17 0500 08/02/17 1840 08/02/17 1843 08/03/17 0406 08/03/17 0843  HGB  --   < > 9.3*  --  9.2*  --   --  9.8*  HCT  --   --  29.1*  --  29.7*  --   --  31.7*  PLT  --   --  143*  --  143*  --   --  150  LABPROT 30.4*  --   --  31.6*  --   --  34.1*  --   INR 2.84  --   --  2.98  --   --  3.40  --   CREATININE  --   < > 4.09* 3.45*  --  3.57*  --  3.95*  < > = values in this interval not displayed.  Estimated Creatinine Clearance: 19.3 mL/min (A) (by C-G formula based on SCr of 3.95 mg/dL (H)).   Assessment: 60yom continues on coumadin for afib. Dose was held for 3 days due to supratherapeutic INR, resumed 8/26. INR has now trended back up and is above goal at 3.4. CBC stable. No bleeding.  PTA dose: 2mg  daily except 1mg  Tue/Thur per patient (recent anticoag visit 7/26 his regimen was changed to 1mg  daily except 2mg  Tue/Thur/Sat)  Goal of Therapy:  INR 2-3 Monitor platelets by anticoagulation protocol: Yes   Plan:  1) Hold coumadin tonight 2) Daily INR  Nena Jordan, PharmD, BCPS 08/03/2017 11:24 AM

## 2017-08-03 NOTE — Evaluation (Signed)
Physical Therapy Assessment and Plan  Patient Details  Name: [name] MRN: [id number] Date of Birth: 1956-10-31  PT Diagnosis: Difficulty walking and Muscle weakness Rehab Potential: Good ELOS: 14-17 days   Today's Date: 08/03/2017 PT Individual Time: 0272-5366 PT Individual Time Calculation (min): 45 min    Problem List:  Patient Active Problem List   Diagnosis Date Noted  . Severe protein-calorie malnutrition (HCC)   . Acute blood loss anemia   . Type 2 diabetes mellitus with peripheral neuropathy (HCC)   . Chronic systolic heart failure (HCC)   . Colostomy care (HCC)   . Chronic kidney disease (CKD), stage IV (severe) (HCC)   . Dependence on renal dialysis (HCC)   . Wounds, multiple   . Supratherapeutic INR   . MSSA bacteremia 08/02/2017  . ESRD (end stage renal disease) (HCC)   . Debility   . Acute on chronic systolic CHF (congestive heart failure) (HCC)   . Bacteremia due to methicillin susceptible Staphylococcus aureus (MSSA)   . Infected defibrillator (HCC)   . Septic hip (HCC)   . Staphylococcus aureus bacteremia with sepsis (HCC)   . Severe sepsis with septic shock (HCC)   . Cardiogenic shock (HCC) 07/10/2017  . Septic shock (HCC) 07/10/2017  . Cardiorenal syndrome with renal failure   . Thrombocytopenia (HCC) 06/14/2017  . GI bleed 04/27/2017  . Portal hypertensive gastropathy (HCC) 04/27/2017  . Hypokalemia 04/27/2017  . Esophageal dysphagia 04/13/2017  . Hepatic cirrhosis (HCC) 02/24/2017  . Acute on chronic systolic heart failure (HCC)   . Biliary disease with obstruction   . AKI (acute kidney injury) (HCC)   . Abnormal LFTs 05/10/2016  . Pain in the chest   . Right heart failure (HCC) 09/26/2015  . History of colonic polyps 09/24/2015  . Chronic atrial fibrillation (HCC)   . Bleeding from colostomy stoma (HCC) 08/23/2015  . Anemia of chronic disease 07/21/2015  . Constipation 05/01/2015  . Transaminitis 05/01/2015  . NSTEMI- 04/23/15- Myoview  scar- no cath 04/27/2015  . Acute respiratory failure (HCC) 04/23/2015  . Chronic cholecystitis 04/04/2015  . Cystitis with hematuria-April 2016 04/04/2015  . Chronically elevated transaminase level-Amiodarone resumed 04/24/15 04/03/2015  . H/O ventricular fibrillation-March 2016 02/24/2015  . BiV ICD (St Jude) gen change 04/10/15 02/24/2015  . Hyperlipidemia   . Dietary noncompliance   . Orthostatic hypotension 07/19/2014  . OSA on CPAP   . GERD (gastroesophageal reflux disease) 06/05/2014  . Gallbladder polyp 03/06/2014  . DM type 2, uncontrolled, with renal complications (HCC) 01/27/2014  . Dizziness 01/26/2014  . Obesity 12/12/2013  . Chronic hypotension 12/11/2013  . Chronic kidney disease, stage IV (severe) (HCC) 12/11/2013  . Chronic a-fib (HCC)   . CAD with LCX disease and stents to LAD 07/10/2013  . Long term current use of anticoagulant therapy 03/23/2013  . History of rectal cancer   . Ischemic cardiomyopathy-EF 35% 04/24/15 echo     Past Medical History:  Past Medical History:  Diagnosis Date  . Adenocarcinoma of rectum (HCC)    a. 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 AICD (automatic cardioverter/defibrillator) present 2002   BI V ICD  . Anemia   . CAD (coronary artery disease)    a. BMS [facility] LAD 2001 at [facility] b. PTCA/atherectomy ramus and BMS to LAD 2009  . CHF (congestive heart failure) (HCC)   . Cholelithiasis 06/2015  . Chronic kidney disease, stage IV (severe) (HCC)   . Chronic systolic heart failure (HCC)   . Cirrhosis (HCC)   .  Colostomy in place [facility])   . Dizziness    a. chronic. Admission for this 07/18/2014  . DM type 2, uncontrolled, with renal complications (HCC)   . Dysrhythmia   . Essential hypertension, benign   . GERD (gastroesophageal reflux disease)   . HCAP (healthcare-associated pneumonia) 07/21/2015  . Hematuria   . History of blood transfusion    "I've had 2 units so far this year" (09/27/2015)  . HLD (hyperlipidemia)   . Ischemic cardiomyopathy    EF 18% by  nuclear study 2016, multiple myocardial infarctions in past    . Myocardial infarction (HCC) 2001  . Obesity   . Orthostatic hypotension   . OSA on CPAP   . Paroxysmal atrial fibrillation (HCC)    a. on amiodarone, digoxin and Eliquis  . PONV (postoperative nausea and vomiting)   . Prostate cancer (HCC)    a. s/p seed implants with chemo and radiation  . SAH (subarachnoid hemorrhage) (HCC)    post-traumatic (fall) [facility] 12/2014  . TIA (transient ischemic attack)    Past Surgical History:  Past Surgical History:  Procedure Laterality Date  . Abdominal and Perineal Resection of Rectum with Total Mesorectal Excision  10/04/2007  . AV FISTULA PLACEMENT Right 09/16/2015   Procedure: ARTERIOVENOUS (AV) FISTULA CREATION - BRACHIOCEPHALIC;  Surgeon: Sherren Kerns, MD;  Location: [facility] OR;  Service: Vascular;  Laterality: Right;  . BI-VENTRICULAR IMPLANTABLE CARDIOVERTER DEFIBRILLATOR  (CRT-D)  2009  . CARDIAC CATHETERIZATION  08/2001; 2009   ; Hattie Perch 07/10/2013  . CARDIAC DEFIBRILLATOR PLACEMENT  2002  . CARDIAC DEFIBRILLATOR PLACEMENT  2009   Upgraded to a BiV ICD  . COLONOSCOPY  09/14/2011   Dr. Jena Gauss: via colostomy, Single pedunculated benign inflammatory polyp. Due for surveillance Oct 2015  . COLONOSCOPY N/A 07/02/2014   Procedure: COLONOSCOPY;  Surgeon: Corbin Ade, MD;  Location: [facility] END[facility];  Service: Endoscopy;  Laterality: N/A;  7:30 / COLONOSCOPY THRU COLOSTOMY  . COLONOSCOPY N/A 12/11/2014   Dr. Jena Gauss via colostomy. Normal. Repeat in 2021.   Marland Kitchen COLONOSCOPY N/A 08/24/2015   Dr. Adela Lank: diminutive polyp not removed, stoma site of bleeding  . COLOSTOMY  2008  . CORONARY ANGIOPLASTY WITH STENT PLACEMENT  2001; ~ 2006   "1 + 1"   . ELECTROPHYSIOLOGIC STUDY N/A 08/28/2015   Procedure: AV Node Ablation;  Surgeon: Will Jorja Loa, MD;  Location: [facility] INVASIVE CV LAB;  Service: Cardiovascular;  Laterality: N/A;  . EP IMPLANTABLE DEVICE N/A 04/10/2015   Procedure: Ppm/Biv Ppm Generator  Changeout;  Surgeon: Marinus Maw, MD;  Location: [facility] INVASIVE CV LAB CUPID;  Service: Cardiovascular;  Laterality: N/A;  . ERCP N/A 06/11/2016   Procedure: ENDOSCOPIC RETROGRADE CHOLANGIOPANCREATOGRAPHY (ERCP);  Surgeon: Dorena Cookey, MD;  Location: [facility] ENDOSCOPY;  Service: Endoscopy;  Laterality: N/A;  . ERCP N/A 01/26/2017   Procedure: ENDOSCOPIC RETROGRADE CHOLANGIOPANCREATOGRAPHY (ERCP);  Surgeon: Dorena Cookey, MD;  Location: [facility] ENDOSCOPY;  Service: Endoscopy;  Laterality: N/A;  . ERCP  06/2016   Dr. Madilyn Fireman: duodenal fistula, dilated bile duct but no obvious stones, s/p sphincterotomy and stent placement  . ERCP  01/2017   Dr. Madilyn Fireman: CBD stones, s/p sphincterotomy, balloon extraction, stent removal  . ESOPHAGOGASTRODUODENOSCOPY N/A 07/02/2014   Procedure: ESOPHAGOGASTRODUODENOSCOPY (EGD);  Surgeon: Corbin Ade, MD;  [facility]: [facility];  Service: Endoscopy;  Laterality: N/A;  7:30  . ESOPHAGOGASTRODUODENOSCOPY N/A 12/11/2014   [id number] EGD  . ESOPHAGOGASTRODUODENOSCOPY (EGD) WITH PROPOFOL N/A 04/18/2017   Procedure: ESOPHAGOGASTRODUODENOSCOPY (EGD) WITH PROPOFOL;  Surgeon: Corbin Ade, MD;  [facility]: [facility];  Service: Endoscopy;  Laterality: N/A;  2:30pm  . GASTROINTESTINAL STENT REMOVAL N/A 01/26/2017   Procedure: GASTROINTESTINAL STENT REMOVAL;  Surgeon: Dorena Cookey, MD;  Location: [facility] ENDOSCOPY;  Service: Endoscopy;  Laterality: N/A;  . GIVENS CAPSULE STUDY N/A 07/23/2015   Dr. Bosie Clos: small non-bleeding AVM, mild gastritis  . IR FLUORO GUIDE CV LINE RIGHT  07/27/2017  . IR US GUIDE VASC ACCESS RIGHT  07/27/2017  . LEFT HEART CATHETERIZATION WITH CORONARY ANGIOGRAM N/A 07/13/2013   Procedure: LEFT HEART CATHETERIZATION WITH CORONARY ANGIOGRAM;  Surgeon: [name], MD;  Location: [facility] CATH LAB;  Service: Cardiovascular;  Laterality: N/A;  . Elease Hashimoto DILATION N/A 07/02/2014   Procedure: Elease Hashimoto DILATION;  Surgeon: Corbin Ade, MD;  Location: [facility];  Service: Endoscopy;   Laterality: N/A;  7:30  . MALONEY DILATION N/A 04/18/2017   Procedure: Elease Hashimoto DILATION;  Surgeon: Corbin Ade, MD;  Location: [facility];  Service: Endoscopy;  Laterality: N/A;  . PORTACATH PLACEMENT  06/2007   "removed ~ 1 yr later"  . RIGHT HEART CATHETERIZATION N/A 02/24/2015   Procedure: RIGHT HEART CATH;  Surgeon: Dolores Patty, MD;  Location: [facility] CATH LAB;  Service: Cardiovascular;  Laterality: N/A;  . SAVORY DILATION N/A 07/02/2014   Procedure: SAVORY DILATION;  Surgeon: Corbin Ade, MD;  Location: [facility];  Service: Endoscopy;  Laterality: N/A;  7:30    Assessment & Plan Clinical Impression: Patient is a 61 y.o. year old male with recent admission to the hospital on 07/10/2017 with increasing bilateral lower extremity edema and increasing shortness of breath. Patient recently seen in urgent care 2 weeks ago for right groin pain he did receive Flexeril was given a prescription for Bactrim which she did not take. Chest x-ray showed right pleural effusion and right lung base atelectasis versus infiltrate. Unilateral films of the pelvis showed no fracture. CT abdomen and pelvis showed cirrhosis moderate ascites. Echocardiogram with ejection fraction of 15-20% systolic function severely reduced. Blood cultures MSSA placed on Intravenous cefazolin 6 weeks until 08/21/2017 then convert to chronic suppressive oral cephalexin therapy. TEE completed showing no evidence of endocarditis and no plans to extract ICD device. Renal service follow-up for severe CKD stage IV initially requiring CRRT and latest creatinine 3.45 and presently on dialysis.  Patient transferred to CIR on 08/02/2017 .   Patient currently requires min with mobility secondary to muscle weakness and decreased standing balance and decreased balance strategies.  Prior to hospitalization, patient was independent  with mobility and lived with Spouse in a House home.  Home access is 1Stairs to enter.  Patient will benefit  from skilled PT intervention to maximize safe functional mobility, minimize fall risk and decrease caregiver burden for planned discharge home with intermittent assist.  Anticipate patient will benefit from follow up [facility] at discharge.  PT - End of Session Activity Tolerance: Tolerates 30+ min activity with multiple rests Endurance Deficit: Yes PT Assessment Rehab Potential (ACUTE/IP ONLY): Good PT Barriers to Discharge: Medical stability;Hemodialysis PT Patient demonstrates impairments in the following area(s): Balance;Edema;Endurance;Motor;Skin Integrity PT Transfers Functional Problem(s): Bed Mobility;Car;Furniture;Bed to Chair PT Locomotion Functional Problem(s): Ambulation;Wheelchair Mobility;Stairs PT Plan PT Intensity: Minimum of 1-2 x/day ,45 to 90 minutes PT Frequency: 5 out of 7 days PT Duration Estimated Length of Stay: 14-17 days PT Treatment/Interventions: Ambulation/gait training;Balance/vestibular training;Community reintegration;Discharge planning;DME/adaptive equipment instruction;Functional mobility training;Neuromuscular re-education;Pain management;Patient/family education;Splinting/orthotics;Stair training;Therapeutic Activities;Therapeutic Exercise;UE/LE Strength taining/ROM;UE/LE Coordination activities;Wheelchair propulsion/positioning  PT Transfers Anticipated Outcome(s): supervision PT Locomotion Anticipated Outcome(s): supervision PT Recommendation Recommendations for Other Services: Therapeutic Recreation consult Therapeutic Recreation Interventions: Outing/community reintergration Follow Up Recommendations: Home health PT Patient destination: Home Equipment Recommended: To be determined  Skilled Therapeutic Intervention Pt participated in skilled PT eval and pt/wife were educated on PT POC and goals.  Pt performed bed mobility with mod A for both LEs.  Sit to stand multiple attempts throughout the session initially mod A, progressed to min A with repetition.  Gait  30' x 4 throughout session with prolonged rest breaks due to fatigue.  Simulated car transfer performed with min A. Pt requires total A for LEs into car due to Rt LE weakness and pain.  Step ups to 4'' step with pt able to step up with min A with Lt LE, unable to step up with Rt LE.  Pt fatigued at end of session, helped back to bed with mod A for sit to supine. Pt left with needs at hand and wife present.  PT Evaluation Precautions/Restrictions Precautions Precautions: Fall Restrictions Weight Bearing Restrictions: No  Pain Pain Assessment Pain Assessment: No/denies pain Home Living/Prior Functioning Home Living Available Help at Discharge: Family;Available 24 hours/day Type of Home: House Home Access: Stairs to enter [employer] of Steps: 1 Entrance Stairs-Rails: None Home Layout: One level Bathroom Shower/Tub: Health visitor: Standard  Lives With: Spouse Prior Function Level of Independence: Independent with basic ADLs;Independent with gait;Independent with transfers  Able to Take Stairs?: Yes Driving: Yes Vocation: On disability Leisure: Hobbies-yes (Comment) (hunting, fishing, watch grandchildren do their sporting events, church)  Cognition Overall Cognitive Status: Within Functional Limits for tasks assessed Arousal/Alertness: Awake/alert Attention: Alternating Memory: Appears intact Safety/Judgment: Appears intact Sensation Sensation Light Touch: Appears Intact Proprioception: Appears Intact Coordination Fine Motor Movements are Fluid and Coordinated: Yes Finger Nose Finger Test: WNL Motor  Motor Motor - Skilled Clinical Observations: generalized weakness    Trunk/Postural Assessment  Cervical Assessment Cervical Assessment: Within Functional Limits Thoracic Assessment Thoracic Assessment: Within Functional Limits Lumbar Assessment Lumbar Assessment:  (posterior pelvic tilt) Postural Control Postural Control: Deficits on  evaluation Righting Reactions: delayed  Balance Dynamic Standing Balance Dynamic Standing - Comments: mod A without AD Extremity Assessment  RUE Assessment RUE Assessment: Within Functional Limits LUE Assessment LUE Assessment: Within Functional Limits [name] Assessment [name] Assessment:  (knee flex/ext 3/5, hip flex 2+/5, ankle PF/DF 3-/5) LLE Assessment LLE Assessment: Within Functional Limits   See Function Navigator for Current Functional Status.   Refer to Care Plan for Long Term Goals  Recommendations for other services: Therapeutic Recreation  Outing/community reintegration  Discharge Criteria: Patient will be discharged from PT if patient refuses treatment 3 consecutive times without medical reason, if treatment goals not met, if there is a change in medical status, if patient makes no progress towards goals or if patient is discharged from hospital.  The above assessment, treatment plan, treatment alternatives and goals were discussed and mutually agreed upon: by patient  Marriana Hibberd 08/03/2017, 11:31 AM

## 2017-08-03 NOTE — Progress Notes (Signed)
While changing the RT IJ central line drsg there was noted a white plastic stick about an inch long sutured just above the insertion site.  I cleaned around it and left it intact under the new Biopatch I applied.

## 2017-08-03 NOTE — Consult Note (Signed)
[place] Nurse ostomy consult note Stoma type/location:  Colostomy since 2008 Stomal assessment/size: pouch intact with closed end pouch Peristomal assessment: not assessed today Ostomy pouching: 2pc. 2 3/4" skin barrier with 2 3/4" closed end pouch with filter  Pt states he does not need assistance or supplies for his ostomy.   [place] Nurse wound consult note Reason for Consult:unstageable sacrum ulcer, DTI bil heels and right great toe, stage II right heel,partial thickness venous ulcers bilateral pretibials Wound type:pressure, venous stasis Pressure Injury POA: yes  Measurement: Left pretibial partial thickness venous ulcer 4cm x 3 no erythema Right pretibial 4cm x 2cm partial thickness venous ulcer no erythema Sacral pressure ulcer 10cm x 7cm x 0.2cm Unstageable with 50% black slough 30% tan slough and 20% beefy red wound bed with scant bloody drainage, skin intact surrounding open wound Left lateral heel has 0.5cm x 0.5cm DTI Right lateral heel 2.5cm x 3cm outer ring of discolored DTI with 1.5cm x 2.0 cm unstageable 100% black center  Right posterior heel has 3cm x 2cm x 0.1cm stage II pressure ulcer Right great toe has 0.8cm x 1cm DTI resolving blood blister  Wound bed: see above Drainage (amount, consistency, odor) see above Periwound:see above Dressing procedure/placement/frequency: I have provided nurses with orders for Foam dressings to bilateral pretibial wounds and both right heel wounds. I have ordered Prevalon Boots, patient is already on an air mattress. Patient is malnourished and would benefit from a nutritional consult, please order if you agree. For Sacral ulcer I have ordered Santyl dressing changes daily. We will not follow, but will remain available to this patient, to nursing, and the medical and/or surgical teams.  Please re-consult if we need to assist further.   Fara Olden, RN-C, WTA-C Wound Treatment Associate

## 2017-08-03 NOTE — Progress Notes (Addendum)
Physical Therapy Note  Patient Details  Name: JEYDEN COFFELT MRN: [id number] Date of Birth: [birth year] Today's Date: 08/03/2017  1300-1415, 75 min individual tx Pain: 8/10, entire [name], premedicated   Therapeutic exercise performed with LEs and trunk to increase strength for functional mobility: in supine-10 x 1 each active assistive R/L straight leg raises, ankle pumps.    Active R/L quad sets, bil glut sets, bil lower trunk rotation, bil hip abd/adduction, 2 x 10 modified abdominal crunches.  In R/L sidelying: 1 x 10 isolated hip ext, knee ext with neutral hip, clam shells.  abdominal and cervical stretch in flat bed without pillow, x 2 minutes.  Pt stated he needed to use urinal, which he did in supine.   Transfer training for forward wt shift during sit>< stand.  In standing with RW, 10 x 1 R/L lateral wt shifts, calf raises, mini squats.  Pt left lying on L side with alarm set and all needs within reach, wife in room.   See function navigator for current status. Evangela Heffler 08/03/2017, 1:22 PM

## 2017-08-03 NOTE — Evaluation (Signed)
Occupational Therapy Assessment and Plan  Patient Details  Name: [name] MRN: [id number] Date of Birth: Jan 15, 1956  OT Diagnosis: muscular wasting and disuse atrophy and muscle weakness (generalized) Rehab Potential: Rehab Potential (ACUTE ONLY): Good ELOS: 18-22 days   Today's Date: 08/03/2017 OT Individual Time: 7026-3785 OT Individual Time Calculation (min): 57 min     Problem List:  Patient Active Problem List   Diagnosis Date Noted  . Severe protein-calorie malnutrition ([place])   . Acute blood loss anemia   . Type 2 diabetes mellitus with peripheral neuropathy (HCC)   . Chronic systolic heart failure ([place])   . Colostomy care ([place])   . Chronic kidney disease (CKD), stage IV (severe) ([place])   . Dependence on renal dialysis ([place])   . Wounds, multiple   . Supratherapeutic INR   . MSSA bacteremia 08/02/2017  . ESRD (end stage renal disease) ([place])   . Debility   . Acute on chronic systolic CHF (congestive heart failure) ([place])   . Bacteremia due to methicillin susceptible Staphylococcus aureus (MSSA)   . Infected defibrillator ([place])   . Septic hip ([place])   . Staphylococcus aureus bacteremia with sepsis ([place])   . Severe sepsis with septic shock ([place])   . Cardiogenic shock ([place]) 07/10/2017  . Septic shock ([place]) 07/10/2017  . Cardiorenal syndrome with renal failure   . Thrombocytopenia ([place]) 06/14/2017  . GI bleed 04/27/2017  . Portal hypertensive gastropathy ([place]) 04/27/2017  . Hypokalemia 04/27/2017  . Esophageal dysphagia 04/13/2017  . Hepatic cirrhosis ([place]) 02/24/2017  . Acute on chronic systolic heart failure ([place])   . Biliary disease with obstruction   . AKI (acute kidney injury) ([place])   . Abnormal LFTs 05/10/2016  . Pain in the chest   . Right heart failure ([place]) 09/26/2015  . History of colonic polyps 09/24/2015  . Chronic atrial fibrillation ([place])   . Bleeding from colostomy stoma ([place]) 08/23/2015  . Anemia of chronic disease 07/21/2015  . Constipation  05/01/2015  . Transaminitis 05/01/2015  . NSTEMI- 04/23/15- Myoview scar- no cath 04/27/2015  . Acute respiratory failure ([place]) 04/23/2015  . Chronic cholecystitis 04/04/2015  . Cystitis with hematuria-April 2016 04/04/2015  . Chronically elevated transaminase level-Amiodarone resumed 04/24/15 04/03/2015  . H/O ventricular fibrillation-March 2016 02/24/2015  . BiV ICD (St Jude) gen change 04/10/15 02/24/2015  . Hyperlipidemia   . Dietary noncompliance   . Orthostatic hypotension 07/19/2014  . OSA on CPAP   . GERD (gastroesophageal reflux disease) 06/05/2014  . Gallbladder polyp 03/06/2014  . DM type 2, uncontrolled, with renal complications ([place]) 88/50/2774  . Dizziness 01/26/2014  . Obesity 12/12/2013  . Chronic hypotension 12/11/2013  . Chronic kidney disease, stage IV (severe) ([place]) 12/11/2013  . Chronic a-fib ([place])   . CAD with LCX disease and stents to LAD 07/10/2013  . Long term current use of anticoagulant therapy 03/23/2013  . History of rectal cancer   . Ischemic cardiomyopathy-EF 35% 04/24/15 echo     Past Medical History:  Past Medical History:  Diagnosis Date  . Adenocarcinoma of rectum ([place])    a. 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 AICD (automatic cardioverter/defibrillator) present 2002   BI V ICD  . Anemia   . CAD (coronary artery disease)    a. BMS to LAD 2001 at [facility] b. PTCA/atherectomy ramus and BMS to LAD 2009  . CHF (congestive heart failure) ([place])   . Cholelithiasis 06/2015  . Chronic kidney disease, stage IV (severe) ([place])   . Chronic systolic heart failure (  HCC)   . Cirrhosis (HCC)   . Colostomy in place [facility])   . Dizziness    a. chronic. Admission for this 07/18/2014  . DM type 2, uncontrolled, with renal complications (HCC)   . Dysrhythmia   . Essential hypertension, benign   . GERD (gastroesophageal reflux disease)   . HCAP (healthcare-associated pneumonia) 07/21/2015  . Hematuria   . History of blood transfusion    "I've had 2 units so far this year" (09/27/2015)   . HLD (hyperlipidemia)   . Ischemic cardiomyopathy    EF 18% by nuclear study 2016, multiple myocardial infarctions in past    . Myocardial infarction (HCC) 2001  . Obesity   . Orthostatic hypotension   . OSA on CPAP   . Paroxysmal atrial fibrillation (HCC)    a. on amiodarone, digoxin and Eliquis  . PONV (postoperative nausea and vomiting)   . Prostate cancer (HCC)    a. s/p seed implants with chemo and radiation  . SAH (subarachnoid hemorrhage) (HCC)    post-traumatic (fall) [facility] 12/2014  . TIA (transient ischemic attack)    Past Surgical History:  Past Surgical History:  Procedure Laterality Date  . Abdominal and Perineal Resection of Rectum with Total Mesorectal Excision  10/04/2007  . AV FISTULA PLACEMENT Right 09/16/2015   Procedure: ARTERIOVENOUS (AV) FISTULA CREATION - BRACHIOCEPHALIC;  Surgeon: Sherren Kerns, MD;  Location: [facility] OR;  Service: Vascular;  Laterality: Right;  . BI-VENTRICULAR IMPLANTABLE CARDIOVERTER DEFIBRILLATOR  (CRT-D)  2009  . CARDIAC CATHETERIZATION  08/2001; 2009   ; Hattie Perch 07/10/2013  . CARDIAC DEFIBRILLATOR PLACEMENT  2002  . CARDIAC DEFIBRILLATOR PLACEMENT  2009   Upgraded to a BiV ICD  . COLONOSCOPY  09/14/2011   Dr. Jena Gauss: via colostomy, Single pedunculated benign inflammatory polyp. Due for surveillance Oct 2015  . COLONOSCOPY N/A 07/02/2014   Procedure: COLONOSCOPY;  Surgeon: Corbin Ade, MD;  [facility];  Service: Endoscopy;  Laterality: N/A;  7:30 / COLONOSCOPY THRU COLOSTOMY  . COLONOSCOPY N/A 12/11/2014   Dr. Jena Gauss via colostomy. Normal. Repeat in 2021.   Marland Kitchen COLONOSCOPY N/A 08/24/2015   Dr. Adela Lank: diminutive polyp not removed, stoma site of bleeding  . COLOSTOMY  2008  . CORONARY ANGIOPLASTY WITH STENT PLACEMENT  2001; ~ 2006   "1 + 1"   . ELECTROPHYSIOLOGIC STUDY N/A 08/28/2015   Procedure: AV Node Ablation;  Surgeon: Will Jorja Loa, MD;  Location: [facility] INVASIVE CV LAB;  Service: Cardiovascular;  Laterality: N/A;  . EP  IMPLANTABLE DEVICE N/A 04/10/2015   Procedure: Ppm/Biv Ppm Generator Changeout;  Surgeon: Marinus Maw, MD;  Location: [facility] INVASIVE CV LAB CUPID;  Service: Cardiovascular;  Laterality: N/A;  . ERCP N/A 06/11/2016   Procedure: ENDOSCOPIC RETROGRADE CHOLANGIOPANCREATOGRAPHY (ERCP);  Surgeon: Dorena Cookey, MD;  Location: [facility] ENDOSCOPY;  Service: Endoscopy;  Laterality: N/A;  . ERCP N/A 01/26/2017   Procedure: ENDOSCOPIC RETROGRADE CHOLANGIOPANCREATOGRAPHY (ERCP);  Surgeon: Dorena Cookey, MD;  Location[facility] ENDOSCOPY;  Service: Endoscopy;  Laterality: N/A;  . ERCP  06/2016   Dr. Madilyn Fireman: duodenal fistula, dilated bile duct but no obvious stones, s/p sphincterotomy and stent placement  . ERCP  01/2017   Dr. Madilyn Fireman: CBD stones, s/p sphincterotomy, balloon extraction, stent removal  . ESOPHAGOGASTRODUODENOSCOPY N/A 07/02/2014   Procedure: ESOPHAGOGASTRODUODENOSCOPY (EGD);  Surgeon: Corbin Ade, MD;  [facility];  Service: Endoscopy;  Laterality: N/A;  7:30  . ESOPHAGOGASTRODUODENOSCOPY N/A 12/11/2014   HFD:BPTXRM EGD  . ESOPHAGOGASTRODUODENOSCOPY (EGD) WITH PROPOFOL  N/A 04/18/2017   Procedure: ESOPHAGOGASTRODUODENOSCOPY (EGD) WITH PROPOFOL;  Surgeon: Daneil Dolin, MD;  [facility];  Service: Endoscopy;  Laterality: N/A;  2:30pm  . GASTROINTESTINAL STENT REMOVAL N/A 01/26/2017   Procedure: GASTROINTESTINAL STENT REMOVAL;  Surgeon: Teena Irani, MD;  Location: [place];  Service: Endoscopy;  Laterality: N/A;  . GIVENS CAPSULE STUDY N/A 07/23/2015   Dr. Michail Sermon: small non-bleeding AVM, mild gastritis  . IR FLUORO GUIDE CV LINE RIGHT  07/27/2017  . IR US GUIDE VASC ACCESS RIGHT  07/27/2017  . LEFT HEART CATHETERIZATION WITH CORONARY ANGIOGRAM N/A 07/13/2013   Procedure: LEFT HEART CATHETERIZATION WITH CORONARY ANGIOGRAM;  Surgeon: Lorretta Harp, MD;  Location: [facility] CATH LAB;  Service: Cardiovascular;  Laterality: N/A;  . Venia Minks DILATION N/A 07/02/2014   Procedure: Venia Minks DILATION;  Surgeon:  Daneil Dolin, MD;  [facility];  Service: Endoscopy;  Laterality: N/A;  7:30  . MALONEY DILATION N/A 04/18/2017   Procedure: Venia Minks DILATION;  Surgeon: Daneil Dolin, MD;  [facility];  Service: Endoscopy;  Laterality: N/A;  . PORTACATH PLACEMENT  06/2007   "removed ~ 1 yr later"  . RIGHT HEART CATHETERIZATION N/A 02/24/2015   Procedure: RIGHT HEART CATH;  Surgeon: Jolaine Artist, MD;  Location: [facility] CATH LAB;  Service: Cardiovascular;  Laterality: N/A;  . SAVORY DILATION N/A 07/02/2014   Procedure: SAVORY DILATION;  Surgeon: Daneil Dolin, MD;  [facility];  Service: Endoscopy;  Laterality: N/A;  7:30    Assessment & Plan Clinical Impression: Patient is a 61 y.o. 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 Per chart review patient lives with spouse independent prior to admission and still driving. One level home one-step entry. Presented 07/10/2017 with increasing bilateral lower extremity edema and increasing shortness of breath. Patient recently seen in urgent care 2 weeks ago for right groin pain he did receive Flexeril was given a prescription for Bactrim which she did not take. Chest x-ray showed right pleural effusion and right lung base atelectasis versus infiltrate. Unilateral films of the pelvis showed no fracture. CT abdomen and pelvis showed cirrhosis moderate ascites. Echocardiogram with ejection fraction of 21-30% systolic function severely reduced. Blood cultures MSSA placed on Intravenous cefazolin 6 weeks until 08/21/2017 then convert to chronic suppressive oral cephalexin therapy. TEE completed showing no evidence of endocarditis  and no plans to extract ICD device. Renal service follow-up for severe CKD stage IV initially requiring CRRT and latest creatinine 3.45 and presently on dialysis. Patient remains on chronic Coumadin therapy. Therapy evaluations completed an ongoing patient limited by fatigue. Request made for physical medicine rehabilitation consult.   Patient transferred to CIR on 08/02/2017 .    Patient currently requires mod with basic self-care skills secondary to muscle weakness, decreased cardiorespiratoy endurance and decreased standing balance, decreased postural control and decreased balance strategies.  Prior to hospitalization, patient could complete ADLs with independent .  Patient will benefit from skilled intervention to decrease level of assist with basic self-care skills and increase independence with basic self-care skills prior to discharge home with care partner.  Anticipate patient will require 24 hour supervision and follow up home health.  OT - End of Session Activity Tolerance: Tolerates 30+  min activity with multiple rests Endurance Deficit: Yes Endurance Deficit Description: requires frequent rest breaks  OT Assessment Rehab Potential (ACUTE ONLY): Good OT Barriers to Discharge: Medical stability;Wound Care;Hemodialysis OT Patient demonstrates impairments in the following area(s): Balance;Endurance;Motor;Pain;Safety;Skin Integrity OT Basic ADL's Functional Problem(s): Grooming;Bathing;Dressing;Toileting OT Transfers Functional Problem(s): Toilet;Tub/Shower OT Additional Impairment(s): None OT Plan OT Intensity: Minimum of 1-2 x/day, 45 to 90 minutes OT Frequency: 5 out of 7 days OT Duration/Estimated Length of Stay: 18-22 days OT Treatment/Interventions: Medical illustrator training;Community reintegration;Discharge planning;Disease mangement/prevention;DME/adaptive equipment instruction;Functional mobility training;Neuromuscular re-education;Pain management;Patient/family  education;Psychosocial support;Self Care/advanced ADL retraining;Skin care/wound managment;Therapeutic Activities;Therapeutic Exercise;UE/LE Strength taining/ROM;Wheelchair propulsion/positioning OT Self Feeding Anticipated Outcome(s): No goal OT Basic Self-Care Anticipated Outcome(s): Supervision OT Toileting Anticipated Outcome(s): Supervision OT Bathroom Transfers Anticipated Outcome(s): Supervision - Min assist OT Recommendation Recommendations for Other Services: Neuropsych consult;Therapeutic Recreation consult Therapeutic Recreation Interventions: Other (comment) Patient destination: Home Follow Up Recommendations: Home health OT;24 hour supervision/assistance Equipment Recommended: To be determined;Tub/shower seat;3 in 1 bedside comode   Skilled Therapeutic Intervention OT eval completed with discussion of rehab process, OT purpose, POC, ELOS, and goals.  ADL assessment completed at sit > stand level at sink.  Pt requiring mod assist sit > stand and assistance to thread [name] into underwear and pants as well as assist to don socks.  Pt reports mild dizziness upon sitting EOB and when standing but reports that it goes away.  Frequent rest breaks provided throughout session due to decreased endurance.  OT Evaluation Precautions/Restrictions  Precautions Precautions: Fall Restrictions Weight Bearing Restrictions: No Vital Signs Therapy Vitals Pulse Rate: 70 Resp: 14 BP: (!) 103/52 Patient Position (if appropriate): Lying Oxygen Therapy SpO2: 98 % O2 Device: Not Delivered Pain Pain Assessment Pain Assessment: No/denies pain Home Living/Prior Functioning Home Living Available Help at Discharge: Family, Available 24 hours/day Type of Home: House Home Access: Stairs to enter Technical brewer of Steps: 1 Entrance Stairs-Rails: None Home Layout: One level Bathroom Shower/Tub: Multimedia programmer: Standard  Lives With: Spouse IADL History Homemaking  Responsibilities: Yes Meal Prep Responsibility: Building surveyor Responsibility: No Cleaning Responsibility: No Bill Paying/Finance Responsibility: Primary Current License: Yes Mode of Transportation: Car Prior Function Level of Independence: Independent with basic ADLs, Independent with gait, Independent with transfers  Able to Take Stairs?: Yes Driving: Yes Vocation: On disability Leisure: Hobbies-yes (Comment) (hunting, fishing, watch grandchildren do their sporting events, church) ADL  See Function Navigator Vision Baseline Vision/History: Wears glasses Wears Glasses: Reading only Patient Visual Report: No change from baseline Vision Assessment?: No apparent visual deficits Perception  Perception: Within Functional Limits Praxis Praxis: Intact Cognition Overall Cognitive Status: Within Functional Limits for tasks assessed Arousal/Alertness: Awake/alert Orientation Level: Person;Place;Situation Person: Oriented Place: Oriented Situation: Oriented Year: 2018 Month: August Day of Week: Correct Memory: Appears intact Immediate Memory Recall: Sock;Blue;Bed Memory Recall: Sock;Blue;Bed Memory Recall Sock: Without Cue Memory Recall Blue: Without Cue Memory Recall Bed: Without Cue Attention: Alternating Safety/Judgment: Appears intact Sensation Sensation Light Touch: Appears Intact Proprioception: Appears Intact Coordination Fine Motor Movements are Fluid and Coordinated: Yes Finger Nose Finger Test: WNL Motor  Motor Motor - Skilled Clinical Observations: generalized weakness  Trunk/Postural Assessment  Cervical Assessment Cervical Assessment: Within Functional Limits Thoracic Assessment Thoracic Assessment: Within Functional Limits Lumbar Assessment Lumbar Assessment:  (posterior pelvic tilt) Postural Control Postural Control: Deficits on evaluation Righting Reactions: delayed  Balance Dynamic Standing Balance Dynamic Standing - Comments: mod A without  AD Extremity/Trunk Assessment RUE Assessment RUE Assessment: Within Functional Limits LUE Assessment LUE Assessment: Within  Functional Limits   See Function Navigator for Current Functional Status.   Refer to Care Plan for Long Term Goals  Recommendations for other services: Neuropsych and Therapeutic Recreation  Stress management   Discharge Criteria: Patient will be discharged from OT if patient refuses treatment 3 consecutive times without medical reason, if treatment goals not met, if there is a change in medical status, if patient makes no progress towards goals or if patient is discharged from hospital.  The above assessment, treatment plan, treatment alternatives and goals were discussed and mutually agreed upon: by patient and by family  Ellwood Dense [facility] 08/03/2017, 12:24 PM

## 2017-08-03 NOTE — Progress Notes (Signed)
[place] PHYSICAL MEDICINE & REHABILITATION     PROGRESS NOTE  Subjective/Complaints:  Pt seen laying in bed this AM.  He slept well overnight.  He is ready to begin therapies.  ROS: Denies CP, SOB, N/V/D.  Objective: Vital Signs: Blood pressure (!) 103/52, pulse 70, temperature 97.6 F (36.4 C), temperature source Oral, resp. rate 14, weight 70.4 kg (155 lb 4.4 oz), SpO2 98 %. No results found.  Recent Labs  08/01/17 0724 08/02/17 1840  WBC 7.0 9.9  HGB 9.3* 9.2*  HCT 29.1* 29.7*  PLT 143* 143*    Recent Labs  08/02/17 0500 08/02/17 1843  NA 132* 132*  K 3.6 3.7  CL 95* 94*  GLUCOSE 100* 157*  BUN 44* 45*  CREATININE 3.45* 3.57*  CALCIUM 8.4* 8.7*   CBG (last 3)   Recent Labs  08/02/17 1254 08/02/17 2121 08/03/17 0646  GLUCAP 103* 121* 72    Wt Readings from Last 3 Encounters:  08/03/17 70.4 kg (155 lb 4.4 oz)  08/02/17 70.2 kg (154 lb 12.2 oz)  06/21/17 79.2 kg (174 lb 8 oz)    Physical Exam:  BP (!) 103/52 (BP Location: Left Arm)   Pulse 70   Temp 97.6 F (36.4 C) (Oral)   Resp 14   Wt 70.4 kg (155 lb 4.4 oz)   SpO2 98%   BMI 22.28 kg/m  Constitutional: No distress. Vital signs reviewed.  HENT: Normocephalic and atraumatic.  Eyes: EOM are normal. No discharge.  Cardiovascular: Cardiac rate controlled. No JVD. Respiratory: Effort normal and breath sounds normal.  GI: Bowel sounds are normal. He exhibits no distension.  Colostomy in place . Musc: No edema. No tenderness Neurological: A&Ox2 Motor: b/l UE 3+/5 deltoid, biceps, triceps, 4 wrist ext, HI. LLE: 3-/5 HF and KE and 4/5 ADF/PF.  [name]: 2+/5 HF, KE and 3/5 ADF/PF.  Skin: Multiple wounds Psych: flat, cooperative   Assessment/Plan: 1. Functional deficits secondary to debility which require 3+ hours per day of interdisciplinary therapy in a comprehensive inpatient rehab setting. Physiatrist is providing close team supervision and 24 hour management of active medical problems listed  below. Physiatrist and rehab team continue to assess barriers to discharge/monitor patient progress toward functional and medical goals.  Function:  Bathing Bathing position      Bathing parts      Bathing assist        Upper Body Dressing/Undressing Upper body dressing                    Upper body assist        Lower Body Dressing/Undressing Lower body dressing                                  Lower body assist        Toileting Toileting          Toileting assist     Transfers Chair/bed transfer             Locomotion Ambulation           Wheelchair          Cognition Comprehension    Expression    Social Interaction    Problem Solving    Memory      Medical Problem List and Plan:  1. Severe debilitation/encephalopathy secondary to respiratory failure, CHF, MSSA sepsis   Begin CIR  Notes reviewed 2. DVT Prophylaxis/Anticoagulation:  Chronic Coumadin therapy and monitor for rebleeding episodes   INR supratherpeutic on 8/29 3. Pain Management: Hydrocodone as needed  4. Mood: Provide emotional support  5. Neuropsych: This patient is capable of making decisions on his own behalf.  6. Skin/Wound Care/lower extremity leg wound: Routine skin checks with skin care as directed   Appreciate WOC recs  Will plan to debride next week 7. Fluids/Electrolytes/Nutrition: Routine I&Os 8. CKD stage IV. Followed closely by renal services.   Cr 3.57 on 8/28, currently on dialysis, appreciate nephro recs  Cont to monitor 9. MSSA bacteremia. Intravenous cefazolin through 08/21/2017 then chronic Keflex and follow-up per infectious disease  10. 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 Routine colostomy care  11. CAD with implantable defibrillator. Follow-up cardiology services. No plan to extract device  12. Chronic systolic congestive heart failure/ischemic cardiomyopathy. Monitor for any signs of fluid overload.  Filed  Weights   08/03/17 0535  Weight: 70.4 kg (155 lb 4.4 oz)  13. Diabetes mellitus and peripheral neuropathy. Lantus insulin 10 units daily at bedtime.   Monitor with increased activity 14. Orthostasis. Continue ProAmatine 10 mg 3 times a day.  Monitor orthostatic vs with activity.  15. Acute on chronic anemia. Continue Aranesp.   Hb 9.2 on 8/28 16. History of gout. Colchicine 0.3 mg daily. Monitor for any gout flareups  17. Malnourishment  Supplement initiated 8/29  LOS (Days) 1 A FACE TO FACE EVALUATION WAS PERFORMED  Yomara Toothman Lorie Phenix 08/03/2017 9:04 AM

## 2017-08-03 NOTE — Progress Notes (Signed)
Patient ID: [name], male   DOB: 28-Mar-1956, 61 y.o.   MRN: [id number]  [facility] Progress Note   Assessment/ Plan:   1. Staphylococcus bacteremia with septic/cardiogenic shock: Remains afebrile on Ancef. Repeat blood cultures from 07/11/17 are negative. Continue ancef per ID recommendations. 2. ESRD: Continue hemodialysis on a Monday/Wednesday/Friday schedule via a right brachiocephalic fistula. Process underway for outpatient dialysis unit placement to [facility] (he was previously a patient there). 3. Anemia:no overt loss, continue aranesp 4. 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 5. Nutrition:with evidence of protein-calorie malnutrition likely with poor wound healing. Continue ONS  6. Deconditioning: now ongoing rehabilitation in CIR.  Subjective:   Reports to be feeling better and excited at the progress he is making with ambulation/transfers.    Objective:   BP (!) 103/52 (BP Location: Left Arm)   Pulse 70   Temp 97.6 F (36.4 C) (Oral)   Resp 14   Wt 70.4 kg (155 lb 4.4 oz)   SpO2 98%   BMI 22.28 kg/m   Physical Exam: IRS:[id number] resting in bed, eating lunch CVS: Pulse regular rhythm, normal rate, 2/6 HSM over apex Resp:Decreased breath sounds over bases, no rales XFG:HWEX, flat, non-tender, bowel sounds normal Ext:1+ LE edema with both lower legs in gauze dressing. Right brachiocephalic fistula.  Labs: BMET  Recent Labs Lab 07/29/17 0456 07/30/17 0740 07/31/17 0500 08/01/17 0724 08/02/17 0500 08/02/17 1843 08/03/17 0843  NA 130* 132* 133* 131* 132* 132* 129*  K 3.8 3.6 3.5 3.3* 3.6 3.7 3.3*  CL 96* 96* 96* 94* 95* 94* 92*  CO2 22 23 24 23 26 27 26   GLUCOSE 110* 100* 75 137* 100* 157* 123*  BUN 70* 74* 54* 59* 44* 45* 48*  CREATININE 4.10* 4.35* 3.65* 4.09* 3.45* 3.57* 3.95*  CALCIUM 8.8* 8.7* 8.2* 8.6* 8.4* 8.7* 8.6*  PHOS 6.3* 6.7* 5.5* 5.9* 5.0* 5.0* 5.0*    CBC  Recent Labs Lab 07/31/17 0500 08/01/17 0724 08/02/17 1840 08/03/17 0843  WBC 6.3 7.0 9.9 8.4  HGB 9.0* 9.3* 9.2* 9.8*  HCT 28.3* 29.1* 29.7* 31.7*  MCV 94.0 93.3 95.5 95.5  PLT 120* 143* 143* 150   Medications:    . colchicine  0.3 mg Oral Daily  . collagenase  1 application Topical Daily  . darbepoetin (ARANESP) injection - NON-DIALYSIS  200 mcg Subcutaneous Q Wed-1800  . feeding supplement (ENSURE ENLIVE)  237 mL Oral BID BM  . feeding supplement (PRO-STAT SUGAR FREE 64)  30 mL Oral BID  . hydrocerin   Topical BID  . insulin aspart  0-5 Units Subcutaneous QHS  . insulin aspart  3 Units Subcutaneous TID WC  . insulin glargine  10 Units Subcutaneous QHS  . midodrine  10 mg Oral TID WC  . multivitamin  1 tablet Oral QHS  . sucroferric oxyhydroxide  500 mg Oral TID WC  . Warfarin - Pharmacist Dosing Inpatient   Does not apply [id number]   Elmarie Shiley, MD 08/03/2017, 12:38 PM

## 2017-08-03 NOTE — Progress Notes (Signed)
HD tx initiated via 15G x2 w/o problem, pull/push/flush equally w/o problem, VSS w/ low bp, will page MD to request 1x Midodrine order, will cont to monitor while on HD tx

## 2017-08-04 ENCOUNTER — Inpatient Hospital Stay (HOSPITAL_COMMUNITY): Payer: Medicare Other | Admitting: Physical Therapy

## 2017-08-04 ENCOUNTER — Inpatient Hospital Stay (HOSPITAL_COMMUNITY): Payer: Medicare Other | Admitting: Occupational Therapy

## 2017-08-04 LAB — GLUCOSE, CAPILLARY
GLUCOSE-CAPILLARY: 68 mg/dL (ref 65–99)
GLUCOSE-CAPILLARY: 138 mg/dL — AB (ref 65–99)
Glucose-Capillary: 109 mg/dL — ABNORMAL HIGH (ref 65–99)
Glucose-Capillary: 102 mg/dL — ABNORMAL HIGH (ref 65–99)
Glucose-Capillary: 100 mg/dL — ABNORMAL HIGH (ref 65–99)

## 2017-08-04 LAB — PROTIME-INR
INR: 3.22
PROTHROMBIN TIME: 32.7 s — AB (ref 11.4–15.2)

## 2017-08-04 MED ORDER — WARFARIN 0.5 MG HALF TABLET
0.5000 mg | ORAL_TABLET | Freq: Once | ORAL | Status: AC
  Administered 2017-08-04: 0.5 mg via ORAL
  Filled 2017-08-04: qty 1

## 2017-08-04 NOTE — Progress Notes (Signed)
Occupational Therapy Session Note  Patient Details  Name: [name] MRN: [id number] Date of Birth: 1956-09-29  Today's Date: 08/04/2017 OT Individual Time: 0177-9390 OT Individual Time Calculation (min): 71 min    Short Term Goals: Week 1:  OT Short Term Goal 1 (Week 1): Pt will complete bathing with min assist at sit > stand level OT Short Term Goal 2 (Week 1): Pt will complete LB dressing with mod assist at sit > stand level OT Short Term Goal 3 (Week 1): Pt will complete toilet transfer with min assist  OT Short Term Goal 4 (Week 1): Pt will complete 2 grooming tasks in standing with min assist for increased standing tolerance  Skilled Therapeutic Interventions/Progress Updates:    Treatment session with focus on functional mobility and activity tolerance.  Pt received in bed reporting feeling better this AM despite late night in HD.  Completed bed mobility and stand pivot transfer min assist to w/c.  Grooming tasks completed with setup of items, completed w/c level due to fatigue.  Engaged in obstacle negotiation and stepping over small thresholds to simulate accessing bathroom and walk-in shower at home.  Min guard to supervision during mobility.  Sit > stand on dynamic surface to increase challenge on standing balance and balance reactions.  Engaged in table top task while standing on foam surface to increase activity tolerance.  Pt required frequent rest breaks throughout due to decreased endurance.  Ambulated 44' with RW with min guard back to room.  Completed ostomy care with setup, left semi-reclined in bed with wife present and all needs in reach.  Therapy Documentation Precautions:  Precautions Precautions: Fall Restrictions Weight Bearing Restrictions: No Pain: Pain Assessment Pain Score: 0-No pain  See Function Navigator for Current Functional Status.   Therapy/Group: Individual Therapy  Simonne Come 08/04/2017, 1:27 PM

## 2017-08-04 NOTE — Progress Notes (Signed)
Occupational Therapy Session Note  Patient Details  Name: [name] MRN: [id number] Date of Birth: 1956-02-13  Today's Date: 08/04/2017 OT Individual Time: 1300-1345 OT Individual Time Calculation (min): 45 min    Short Term Goals: Week 1:  OT Short Term Goal 1 (Week 1): Pt will complete bathing with min assist at sit > stand level OT Short Term Goal 2 (Week 1): Pt will complete LB dressing with mod assist at sit > stand level OT Short Term Goal 3 (Week 1): Pt will complete toilet transfer with min assist  OT Short Term Goal 4 (Week 1): Pt will complete 2 grooming tasks in standing with min assist for increased standing tolerance  Skilled Therapeutic Interventions/Progress Updates:    Pt seen for OT session focusing on functional activity tolerance and transfers. Pt in supine upon arrival, voicing increased fatigued, however, willing to attempt therapy.  He ambulated short distance with min A using RW to w/c. He self propelled w/c throughout unit for UE strengthening and general activity tolerance, rest breaks required throughout.  Completed simulated shower stall transfer using RW. Following demonstration and VCs for technique, pt able to step back over shower ledge with min A, increased time for management of R LE over shower ledge.  Following seated rest break, pt ambulated ~22ft back towards room with CGA, requested return in w/c remainder of way due to fatigue. Pt transitioned with min A back to bed at end of session, left in supine with hand off to PT.  Throughout session, pt voiced increased pain in R LE, RN made aware. Also blister observed on R shin, RN also alerted to this.  Education provided throughout session regarding role of OT, POC, DME, and d/c planning.   Therapy Documentation Precautions:  Precautions Precautions: Fall Restrictions Weight Bearing Restrictions: No  See Function Navigator for Current Functional Status.   Therapy/Group: Individual  Therapy  Lewis, Vannia Pola C 08/04/2017, 2:09 PM

## 2017-08-04 NOTE — Progress Notes (Signed)
[facility] PHYSICAL MEDICINE & REHABILITATION     PROGRESS NOTE  Subjective/Complaints:  Pt seen laying in bed this AM.  He slept well overnight.  He notes therapies were very tiring yesterday.    ROS: Denies CP, SOB, N/V/D.  Objective: Vital Signs: Blood pressure (!) 91/51, pulse 73, temperature 98.4 F (36.9 C), temperature source Oral, resp. rate 15, height 5\' 10"  (1.778 m), weight 76.5 kg (168 lb 11.9 oz), SpO2 97 %. No results found.  Recent Labs  08/03/17 0843 08/03/17 2002  WBC 8.4 8.6  HGB 9.8* 9.6*  HCT 31.7* 29.8*  PLT 150 151    Recent Labs  08/03/17 0843 08/03/17 2002  NA 129* 129*  K 3.3* 3.6  CL 92* 92*  GLUCOSE 123* 97  BUN 48* 54*  CREATININE 3.95* 4.26*  CALCIUM 8.6* 8.5*   CBG (last 3)   Recent Labs  08/03/17 1658 08/04/17 0157 08/04/17 0634  GLUCAP 98 68 109*    Wt Readings from Last 3 Encounters:  08/04/17 76.5 kg (168 lb 11.9 oz)  08/02/17 70.2 kg (154 lb 12.2 oz)  06/21/17 79.2 kg (174 lb 8 oz)    Physical Exam:  BP (!) 91/51 (BP Location: Left Arm)   Pulse 73   Temp 98.4 F (36.9 C) (Oral)   Resp 15   Ht 5\' 10"  (1.778 m)   Wt 76.5 kg (168 lb 11.9 oz)   SpO2 97%   BMI 24.21 kg/m  Constitutional: No distress. Vital signs reviewed.  HENT: Normocephalic and atraumatic.  Eyes: EOM are normal. No discharge.  Cardiovascular: Cardiac rate controlled. No JVD. Respiratory: Effort normal and breath sounds normal.  GI: Bowel sounds are normal. He exhibits no distension.  Colostomy in place . Musc: No edema. No tenderness Neurological: Alert.  Motor: b/l UE 4/5 deltoid, biceps, triceps, wrist ext, HI.  LLE: 4-/5 HF and 4+/5 KE and 5/5 ADF/PF.  [name]: 3-/5 HF, 4/5 KE and 4+/5 ADF/PF.  Skin: Multiple wounds Psych: flat, cooperative   Assessment/Plan: 1. Functional deficits secondary to debility which require 3+ hours per day of interdisciplinary therapy in a comprehensive inpatient rehab setting. Physiatrist is providing close team  supervision and 24 hour management of active medical problems listed below. Physiatrist and rehab team continue to assess barriers to discharge/monitor patient progress toward functional and medical goals.  Function:  Bathing Bathing position   Position: Sitting EOB  Bathing parts Body parts bathed by patient: Right arm, Left arm, Chest, Abdomen, Front perineal area, Right upper leg, Left upper leg Body parts bathed by helper: Buttocks, Right lower leg, Left lower leg, Back  Bathing assist Assist Level:  (Mod assist)      Upper Body Dressing/Undressing Upper body dressing   What is the patient wearing?: Pull over shirt/dress     Pull over shirt/dress - Perfomed by patient: Thread/unthread right sleeve, Thread/unthread left sleeve, Put head through opening, Pull shirt over trunk          Upper body assist Assist Level: Set up   Set up : To obtain clothing/put away  Lower Body Dressing/Undressing Lower body dressing   What is the patient wearing?: Underwear, Pants, Non-skid slipper socks Underwear - Performed by patient: Thread/unthread left underwear leg, Pull underwear up/down Underwear - Performed by helper: Thread/unthread right underwear leg Pants- Performed by patient: Thread/unthread left pants leg, Pull pants up/down Pants- Performed by helper: Thread/unthread right pants leg   Non-skid slipper socks- Performed by helper: Don/doff right sock, Don/doff left  sock                  Lower body assist Assist for lower body dressing:  (Mod assist)      Toileting Toileting          Toileting assist     Transfers Chair/bed transfer     Chair/bed transfer assist level: Moderate assist (Pt 50 - 74%/lift or lower)       Locomotion Ambulation     Max distance: 30 Assist level: Touching or steadying assistance (Pt > 75%)   Wheelchair   Type: Manual Max wheelchair distance: 30 Assist Level: Supervision or verbal cues  Cognition Comprehension Comprehension  assist level: Follows basic conversation/direction with no assist  Expression Expression assist level: Expresses basic needs/ideas: With extra time/assistive device  Social Interaction Social Interaction assist level: Interacts appropriately 90% of the time - Needs monitoring or encouragement for participation or interaction.  Problem Solving Problem solving assist level: Solves basic 50 - 74% of the time/requires cueing 25 - 49% of the time  Memory Memory assist level: Requires cues to use assistive device, Recognizes or recalls 90% of the time/requires cueing < 10% of the time    Medical Problem List and Plan:  1. Severe debilitation/encephalopathy secondary to respiratory failure, CHF, MSSA sepsis   Cont CIR 2. DVT Prophylaxis/Anticoagulation: Chronic Coumadin therapy and monitor for rebleeding episodes   INR supratherpeutic on 8/30 3. Pain Management: Hydrocodone as needed  4. Mood: Provide emotional support  5. Neuropsych: This patient is capable of making decisions on his own behalf.  6. Skin/Wound Care/lower extremity leg wound: Routine skin checks with skin care as directed   Appreciate WOC recs  Will plan to debride next week 7. Fluids/Electrolytes/Nutrition: Routine I&Os 8. CKD stage IV. Followed closely by renal services.   Cr 4.26 on 8/29, currently on dialysis, appreciate nephro recs  Cont to monitor 9. MSSA bacteremia. Intravenous cefazolin through 08/21/2017 then chronic Keflex and follow-up per infectious disease  10. 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 Routine colostomy care  11. CAD with implantable defibrillator. Follow-up cardiology services. No plan to extract device  12. Chronic systolic congestive heart failure/ischemic cardiomyopathy. Monitor for any signs of fluid overload.  Filed Weights   08/03/17 2005 08/04/17 0105 08/04/17 0605  Weight: 78 kg (171 lb 15.3 oz) 76 kg (167 lb 8.8 oz) 76.5 kg (168 lb 11.9 oz)  13. Diabetes mellitus and  peripheral neuropathy. Lantus insulin 10 units daily at bedtime.   Relatively controlled 8/30 14. Orthostasis. Continue ProAmatine 10 mg 3 times a day.  Monitor orthostatic with activity.  15. Acute on chronic anemia. Continue Aranesp.   Hb 9.6 on 8/29 16. History of gout. Colchicine 0.3 mg daily. Monitor for any gout flareups  17. Malnourishment  Supplement initiated 8/29  LOS (Days) 2 A FACE TO FACE EVALUATION WAS PERFORMED  Taya Ashbaugh Lorie Phenix 08/04/2017 9:01 AM

## 2017-08-04 NOTE — Progress Notes (Signed)
Physical Therapy Note  Patient Details  Name: SHOURYA MACPHERSON MRN: [id number] Date of Birth: August 22, 1956 Today's Date: 08/04/2017    Time: 1343-1425 42 minutes  1:1 Pt continues with c/o pain in Rt LE at all times, RN made aware and meds due after session.  Pt back in bed after previous OT session, refuses out of bed activity but is agreeable to bed level exercise.  Pt performs 2 x 10 heel slides, hip abd/add, add squeeze, SAQ, ankle pumps, bridging.  UE with 1kg med ball 2 x 10 shoulder flex, diagonals, chest press.  with orange theraband 2 x 10 horizontal abduction, bicep curls, PNF diagonals.  Pt left in bed with nursing present for meds and dressing changes.   Kreston Ahrendt 08/04/2017, 2:29 PM

## 2017-08-04 NOTE — Progress Notes (Signed)
RT NOTE:  Pt refuses to wear CPAP. Pt does not tolerate CPAP.

## 2017-08-04 NOTE — Progress Notes (Signed)
[facility] Individual Statement of Services  Patient Name:  [name]  Date:  08/04/2017  Welcome to the [facility].  Our goal is to provide you with an individualized program based on your diagnosis and situation, designed to meet your specific needs.  With this comprehensive rehabilitation program, you will be expected to participate in at least 3 hours of rehabilitation therapies Monday-Friday, with modified therapy programming on the weekends.  Your rehabilitation program will include the following services:  Physical Therapy (PT), Occupational Therapy (OT), 24 hour per day rehabilitation nursing, Case Management (Social Worker), Rehabilitation Medicine, Nutrition Services and Pharmacy Services  Weekly team conferences will be held on Wednesdays to discuss your progress.  Your Social Worker will talk with you frequently to get your input and to update you on team discussions.  Team conferences with you and your family in attendance may also be held.  Expected length of stay:  2 to 3 weeks  Overall anticipated outcome:  Supervision  Depending on your progress and recovery, your program may change. Your Social Worker will coordinate services and will keep you informed of any changes. Your Social Worker's name and contact numbers are listed  below.  The following services may also be recommended but are not provided by the [facility] will be made to provide these services after discharge if needed.  Arrangements include referral to agencies that provide these services.  Your insurance has been verified to be:  Medicare and [employer] primary doctor is:  Dr. Redmond School  Pertinent information will be shared with your doctor and your insurance company.  Social Worker:  Alfonse Alpers, LCSW  [phone] or [phone]  Information discussed with and copy given to patient by: Trey Sailors, 08/04/2017, 2:06 PM

## 2017-08-04 NOTE — Progress Notes (Signed)
Social Work Patient ID: [name], male   DOB: 22-Oct-1956, 61 y.o.   MRN: [id number]   CSW met with pt's wife and pt to update them on team conference discussion and targeted d/c date while completing assessment.  Pt is targeted for 08-20-17.  Pt's wife is pleased that he will have 2 more weeks on CIR.  CSW will continue to follow and assist as needed.

## 2017-08-04 NOTE — Progress Notes (Signed)
HD tx completed @ 0030 w/ low bp throughout despite giving extra dose of midodrine @ 2055, pt stayed in the mid to high 80's mostly but did get to a low 90's a few times, pt was asymptomatic the entire tx. Dr. Hollie Salk was up in the unit for a little while during his tx and was made aware of sbp in 62's /asymptomatic and was ok w/ me keeping the UF on. UF goal met, blood rinsed back, report called to Etheleen Nicks, RN

## 2017-08-04 NOTE — Patient Care Conference (Signed)
Inpatient RehabilitationTeam Conference and Plan of Care Update Date: 08/03/2017   Time: 2:55 PM    Patient Name: [name]      Medical Record Number: [id number]  Date of Birth: Dec 25, 1955 Sex: Male         Room/Bed: 4W19C/4W19C-01 Payor Info: Payor: MEDICARE / Plan: MEDICARE PART A AND B / Product Type: *No Product type* /    Admitting Diagnosis: Debility  Admit Date/Time:  08/02/2017  5:24 PM Admission Comments: No comment available   Primary Diagnosis:  <principal problem not specified> Principal Problem: <principal problem not specified>  Patient Active Problem List   Diagnosis Date Noted  . Severe protein-calorie malnutrition (HCC)   . Acute blood loss anemia   . Type 2 diabetes mellitus with peripheral neuropathy (HCC)   . Chronic systolic heart failure (HCC)   . Colostomy care (HCC)   . Chronic kidney disease (CKD), stage IV (severe) (HCC)   . Dependence on renal dialysis (HCC)   . Wounds, multiple   . Supratherapeutic INR   . MSSA bacteremia 08/02/2017  . ESRD (end stage renal disease) (HCC)   . Debility   . Acute on chronic systolic CHF (congestive heart failure) (HCC)   . Bacteremia due to methicillin susceptible Staphylococcus aureus (MSSA)   . Infected defibrillator (HCC)   . Septic hip (HCC)   . Staphylococcus aureus bacteremia with sepsis (HCC)   . Severe sepsis with septic shock (HCC)   . Cardiogenic shock (HCC) 07/10/2017  . Septic shock (HCC) 07/10/2017  . Cardiorenal syndrome with renal failure   . Thrombocytopenia (HCC) 06/14/2017  . GI bleed 04/27/2017  . Portal hypertensive gastropathy (HCC) 04/27/2017  . Hypokalemia 04/27/2017  . Esophageal dysphagia 04/13/2017  . Hepatic cirrhosis (HCC) 02/24/2017  . Acute on chronic systolic heart failure (HCC)   . Biliary disease with obstruction   . AKI (acute kidney injury) (HCC)   . Abnormal LFTs 05/10/2016  . Pain in the chest   . Right heart failure (HCC) 09/26/2015  . History of colonic polyps  09/24/2015  . Chronic atrial fibrillation (HCC)   . Bleeding from colostomy stoma (HCC) 08/23/2015  . Anemia of chronic disease 07/21/2015  . Constipation 05/01/2015  . Transaminitis 05/01/2015  . NSTEMI- 04/23/15- Myoview scar- no cath 04/27/2015  . Acute respiratory failure (HCC) 04/23/2015  . Chronic cholecystitis 04/04/2015  . Cystitis with hematuria-April 2016 04/04/2015  . Chronically elevated transaminase level-Amiodarone resumed 04/24/15 04/03/2015  . H/O ventricular fibrillation-March 2016 02/24/2015  . BiV ICD (St Jude) gen change 04/10/15 02/24/2015  . Hyperlipidemia   . Dietary noncompliance   . Orthostatic hypotension 07/19/2014  . OSA on CPAP   . GERD (gastroesophageal reflux disease) 06/05/2014  . Gallbladder polyp 03/06/2014  . DM type 2, uncontrolled, with renal complications (HCC) 01/27/2014  . Dizziness 01/26/2014  . Obesity 12/12/2013  . Chronic hypotension 12/11/2013  . Chronic kidney disease, stage IV (severe) (HCC) 12/11/2013  . Chronic a-fib (HCC)   . CAD with LCX disease and stents to LAD 07/10/2013  . Long term current use of anticoagulant therapy 03/23/2013  . History of rectal cancer   . Ischemic cardiomyopathy-EF 35% 04/24/15 echo     Expected Discharge Date: Expected Discharge Date: 08/20/17  Team Members Present: Physician leading conference: Dr. Maryla Morrow Social Worker Present: Staci Acosta, LCSW Nurse Present: Carlean Purl, RN PT Present: Midge Minium, PT OT Present: Rosalio Loud, OT SLP Present: Jackalyn Lombard, SLP PPS Coordinator present : Tora Duck, RN, CRRN  Current Status/Progress Goal Weekly Team Focus  Medical   Severe debilitation/encephalopathy secondary to respiratory failure, CHF, MSSA sepsis   Improve mobility, safety, endurance, coumadin, wounds, renal function, DM meds  See above   Bowel/Bladder   HD; MWF, Oliguric, Ostomy in place, BM from rectum occassionally  Supervision  Maintain continence   Swallow/Nutrition/  Hydration             ADL's   Mod assist overall  Supervision overall  ADL retraining, transfers, standing tolerance, activity tolerance/endurance   Mobility   min A gait 30' and transfers  supervision gait and transfers  activity tolerance, gait, stairs   Communication             Safety/Cognition/ Behavioral Observations            Pain   1-2 tablet, Norco q 4hr prn  <3 on a 0-10 pain scale  assess pain q 4hr and prn   Skin   B-heels eschar with foam dressing, BLE with venous stasis ulcers with vasoline gauze and kerlix, Unstageable to sacrum with Santyl, moist to dry gauze, foam, MASD to groin, scrotom, thigh, perineum with MGP  no new skin breakdown while on rehab  assess skin q shift and prn and perform daily dressing changes    Rehab Goals Patient on target to meet rehab goals: Yes Rehab Goals Revised: none - pt's first conference and this is day of eval *See Care Plan and progress notes for long and short-term goals.     Barriers to Discharge  Current Status/Progress Possible Resolutions Date Resolved   Physician    Medical stability;IV antibiotics;Wound Care     See above  Therapies, adjust coumadin, wound care, neprho recs, optimize DM meds      Nursing  Hemodialysis;Wound Care               PT  Medical stability;Hemodialysis                 OT Medical stability;Wound Care;Hemodialysis                SLP                SW                Discharge Planning/Teaching Needs:  Pt to return to his home with his wife and extended family to assist pt.  Pt's wife will be offered family education prior to pt's d/c.   Team Discussion:  Pt's INR is sub-therapeutic and Dr. Allena Katz is making changes to pt's coumadin.  He may need to debride pt's sacral wound next week.  Pt is on dialysis at present on M/W/F.  Dr. Allena Katz is also watching pt's CBGs for need for insulin adjustment.  Pt has managed his own colostomy since 2008.  Pt did well on eval at moderate assist overall with  supervision level goals.  Pt fatigues easily and needs rest breaks.  Family is supportive with many of them being in the medical field.  Revisions to Treatment Plan:  none    Continued Need for Acute Rehabilitation Level of Care: The patient requires daily medical management by a physician with specialized training in physical medicine and rehabilitation for the following conditions: Daily direction of a multidisciplinary physical rehabilitation program to ensure safe treatment while eliciting the highest outcome that is of practical value to the patient.: Yes Daily medical management of patient stability for increased activity during participation in an intensive rehabilitation regime.:  Yes Daily analysis of laboratory values and/or radiology reports with any subsequent need for medication adjustment of medical intervention for : Cardiac problems;Pulmonary problems;Diabetes problems;Renal problems;Wound care problems  Keaten Mashek, [place] Deck 08/05/2017, 9:23 AM

## 2017-08-04 NOTE — Progress Notes (Signed)
PT unavailable for CPAP not in room at this time.

## 2017-08-04 NOTE — Progress Notes (Signed)
ANTICOAGULATION/ANTIBIOTIC CONSULT NOTE - Follow Up Consult  Pharmacy Consult for Coumadin and Cefazolin Indication: atrial fibrillation and MSSA bacteremia  No Known Allergies  Patient Measurements: Height: 5\' 10"  (177.8 cm) Weight: 168 lb 11.9 oz (76.5 kg) IBW/kg (Calculated) : 73  Vital Signs: Temp: 98.4 F (36.9 C) (08/30 0500) Temp Source: Oral (08/30 0500) BP: 91/51 (08/30 0500) Pulse Rate: 73 (08/30 0500)  Labs:  Recent Labs  08/02/17 0500  08/02/17 1840 08/02/17 1843 08/03/17 0406 08/03/17 0843 08/03/17 2002 08/04/17 0421  HGB  --   < > 9.2*  --   --  9.8* 9.6*  --   HCT  --   --  29.7*  --   --  31.7* 29.8*  --   PLT  --   --  143*  --   --  150 151  --   LABPROT 31.6*  --   --   --  34.1*  --   --  32.7*  INR 2.98  --   --   --  3.40  --   --  3.22  CREATININE 3.45*  --   --  3.57*  --  3.95* 4.26*  --   < > = values in this interval not displayed.  Estimated Creatinine Clearance: 18.6 mL/min (A) (by C-G formula based on SCr of 4.26 mg/dL (H)).   Assessment: 60yom continues on coumadin for afib. Dose was held for 3 days due to supratherapeutic INR, resumed 8/26. INR trended up again to 3.4 yesterday and dose was held. INR lower but still above goal at 3.22 today. PTA dose: 2mg  daily except 1mg  Tue/Thur  He also continues on cefazolin for MSSA bacteremia. TEE 8/14 without evidence of endocarditis or vegetation on valves/ICD. ICD unable to be removed. Planning for 6 weeks of total cefazolin then suppressive cephalexin. He was previously on CRRT and has now transitioned to intermittent dialysis with sessions 8/25, 8/27,8/29 thus far. Attempting MWF schedule.  Vanc 8/4 x1 Zosyn 8/4>> 8/5 Cefazolin 8/5 >>(9/16) Rifampin 8/5 >>8/8 (d/c'd d/t hyperbilirubinemia)   8/4 bld x2: MSSA in 2/2 8/4 urine: NG 8/5 MRSA PCR: neg 8/6 bld x2>ng   Goal of Therapy:  INR 2-3 Monitor platelets by anticoagulation protocol: Yes   Plan:  1) Coumadin 0.5mg  tonight 2)  Daily INR 3) Continue cefazolin 2g IV q24   Nena Jordan, PharmD, BCPS 08/04/2017 10:33 AM

## 2017-08-05 ENCOUNTER — Inpatient Hospital Stay (HOSPITAL_COMMUNITY): Payer: Medicare Other | Admitting: Physical Therapy

## 2017-08-05 ENCOUNTER — Inpatient Hospital Stay (HOSPITAL_COMMUNITY): Payer: Medicare Other | Admitting: Occupational Therapy

## 2017-08-05 LAB — RENAL FUNCTION PANEL
Albumin: 1.8 g/dL — ABNORMAL LOW (ref 3.5–5.0)
Anion gap: 5 (ref 5–15)
BUN: 28 mg/dL — AB (ref 6–20)
CHLORIDE: 98 mmol/L — AB (ref 101–111)
CO2: 30 mmol/L (ref 22–32)
Calcium: 8.2 mg/dL — ABNORMAL LOW (ref 8.9–10.3)
Creatinine, Ser: 2.53 mg/dL — ABNORMAL HIGH (ref 0.61–1.24)
GFR calc Af Amer: 30 mL/min — ABNORMAL LOW (ref >60)
GFR, EST NON AFRICAN AMERICAN: 26 mL/min — AB (ref >60)
GLUCOSE: 77 mg/dL (ref 65–99)
Phosphorus: 2.4 mg/dL — ABNORMAL LOW (ref 2.5–4.6)
Potassium: 3.2 mmol/L — ABNORMAL LOW (ref 3.5–5.1)
Sodium: 133 mmol/L — ABNORMAL LOW (ref 135–145)

## 2017-08-05 LAB — CBC
HCT: 30.5 % — ABNORMAL LOW (ref 39.0–52.0)
Hemoglobin: 9.3 g/dL — ABNORMAL LOW (ref 13.0–17.0)
MCH: 29.5 pg (ref 26.0–34.0)
MCHC: 30.5 g/dL (ref 30.0–36.0)
MCV: 96.8 fL (ref 78.0–100.0)
PLATELETS: 92 10*3/uL — AB (ref 150–400)
RBC: 3.15 MIL/uL — ABNORMAL LOW (ref 4.22–5.81)
RDW: 19.5 % — AB (ref 11.5–15.5)
WBC: 9.4 10*3/uL (ref 4.0–10.5)

## 2017-08-05 LAB — PROTIME-INR
INR: 2.66
PROTHROMBIN TIME: 28.1 s — AB (ref 11.4–15.2)

## 2017-08-05 LAB — GLUCOSE, CAPILLARY
GLUCOSE-CAPILLARY: 127 mg/dL — AB (ref 65–99)
GLUCOSE-CAPILLARY: 57 mg/dL — AB (ref 65–99)
GLUCOSE-CAPILLARY: 70 mg/dL (ref 65–99)
GLUCOSE-CAPILLARY: 87 mg/dL (ref 65–99)
Glucose-Capillary: 76 mg/dL (ref 65–99)
Glucose-Capillary: 116 mg/dL — ABNORMAL HIGH (ref 65–99)
Glucose-Capillary: 58 mg/dL — ABNORMAL LOW (ref 65–99)

## 2017-08-05 MED ORDER — SODIUM CHLORIDE 0.9 % IV SOLN
125.0000 mg | INTRAVENOUS | Status: AC
  Administered 2017-08-05 – 2017-08-12 (×4): 125 mg via INTRAVENOUS
  Filled 2017-08-05 (×7): qty 10

## 2017-08-05 MED ORDER — HEPARIN SODIUM (PORCINE) 1000 UNIT/ML DIALYSIS
40.0000 [IU]/kg | INTRAMUSCULAR | Status: DC | PRN
  Administered 2017-08-05: 3000 [IU] via INTRAVENOUS_CENTRAL
  Filled 2017-08-05 (×2): qty 3

## 2017-08-05 MED ORDER — WARFARIN SODIUM 2 MG PO TABS
2.0000 mg | ORAL_TABLET | Freq: Once | ORAL | Status: AC
  Administered 2017-08-05: 2 mg via ORAL
  Filled 2017-08-05: qty 1

## 2017-08-05 MED ORDER — MIDODRINE HCL 5 MG PO TABS
ORAL_TABLET | ORAL | Status: AC
  Administered 2017-08-05: 10 mg via ORAL
  Filled 2017-08-05: qty 2

## 2017-08-05 NOTE — Progress Notes (Signed)
Hypoglycemic Event  CBG: 57            Treatment: 15 GM carbohydrate snack/ate his meal  Symptoms: None  Follow-up CBG: Time 2154 CBG Result:58  Possible Reasons for Event: Inadequate meal intake  Comments/MD notified:MD to notify    [name]

## 2017-08-05 NOTE — Progress Notes (Signed)
Social Work Patient ID: [name], male   DOB: 1956/07/01, 61 y.o.   MRN: [id number]   [name], Robert Dates, LCSW Social Worker Signed   Patient Care Conference Date of Service: 08/04/2017  3:06 PM      Hide copied text Hover for attribution information Inpatient RehabilitationTeam Conference and Plan of Care Update Date: 08/03/2017   Time: 2:55 PM      Patient Name: [name]      Medical Record Number: [id number]  Date of Birth: [birth year] Sex: Male         Room/Bed: 4W19C/4W19C-01 Payor Info: Payor: MEDICARE / Plan: MEDICARE PART A AND B / Product Type: *No Product type* /     Admitting Diagnosis: Debility  Admit Date/Time:  08/02/2017  5:24 PM Admission Comments: No comment available    Primary Diagnosis:  <principal problem not specified> Principal Problem: <principal problem not specified>       Patient Active Problem List    Diagnosis Date Noted  . Severe protein-calorie malnutrition (HCC)    . Acute blood loss anemia    . Type 2 diabetes mellitus with peripheral neuropathy (HCC)    . Chronic systolic heart failure (HCC)    . Colostomy care (HCC)    . Chronic kidney disease (CKD), stage IV (severe) (HCC)    . Dependence on renal dialysis (HCC)    . Wounds, multiple    . Supratherapeutic INR    . MSSA bacteremia 08/02/2017  . ESRD (end stage renal disease) (HCC)    . Debility    . Acute on chronic systolic CHF (congestive heart failure) (HCC)    . Bacteremia due to methicillin susceptible Staphylococcus aureus (MSSA)    . Infected defibrillator (HCC)    . Septic hip (HCC)    . Staphylococcus aureus bacteremia with sepsis (HCC)    . Severe sepsis with septic shock (HCC)    . Cardiogenic shock (HCC) 07/10/2017  . Septic shock (HCC) 07/10/2017  . Cardiorenal syndrome with renal failure    . Thrombocytopenia (HCC) 06/14/2017  . GI bleed 04/27/2017  . Portal hypertensive gastropathy (HCC) 04/27/2017  . Hypokalemia 04/27/2017  . Esophageal dysphagia  04/13/2017  . Hepatic cirrhosis (HCC) 02/24/2017  . Acute on chronic systolic heart failure (HCC)    . Biliary disease with obstruction    . AKI (acute kidney injury) (HCC)    . Abnormal LFTs 05/10/2016  . Pain in the chest    . Right heart failure (HCC) 09/26/2015  . History of colonic polyps 09/24/2015  . Chronic atrial fibrillation (HCC)    . Bleeding from colostomy stoma (HCC) 08/23/2015  . Anemia of chronic disease 07/21/2015  . Constipation 05/01/2015  . Transaminitis 05/01/2015  . NSTEMI- 04/23/15- Myoview scar- no cath 04/27/2015  . Acute respiratory failure (HCC) 04/23/2015  . Chronic cholecystitis 04/04/2015  . Cystitis with hematuria-April 2016 04/04/2015  . Chronically elevated transaminase level-Amiodarone resumed 04/24/15 04/03/2015  . H/O ventricular fibrillation-March 2016 02/24/2015  . BiV ICD (St Jude) gen change 04/10/15 02/24/2015  . Hyperlipidemia    . Dietary noncompliance    . Orthostatic hypotension 07/19/2014  . OSA on CPAP    . GERD (gastroesophageal reflux disease) 06/05/2014  . Gallbladder polyp 03/06/2014  . DM type 2, uncontrolled, with renal complications (HCC) 01/27/2014  . Dizziness 01/26/2014  . Obesity 12/12/2013  . Chronic hypotension 12/11/2013  . Chronic kidney disease, stage IV (severe) (HCC) 12/11/2013  . Chronic a-fib (HCC)    .  CAD with LCX disease and stents to LAD 07/10/2013  . Long term current use of anticoagulant therapy 03/23/2013  . History of rectal cancer    . Ischemic cardiomyopathy-EF 35% 04/24/15 echo        Expected Discharge Date: Expected Discharge Date: 08/20/17   Team Members Present: Physician leading conference: [name] Social Worker Present: Robert Acosta, LCSW Nurse Present: Robert Purl, RN PT Present: [name], PT OT Present: [name], OT SLP Present: [name], SLP PPS Coordinator present : Robert Duck, RN, CRRN       Current Status/Progress Goal Weekly Team Focus  Medical     Severe  debilitation/encephalopathy secondary to respiratory failure, CHF, MSSA sepsis   Improve mobility, safety, endurance, coumadin, wounds, renal function, DM meds  See above   Bowel/Bladder     HD; MWF, Oliguric, Ostomy in place, BM from rectum occassionally  Supervision  Maintain continence   Swallow/Nutrition/ Hydration               ADL's     Mod assist overall  Supervision overall  ADL retraining, transfers, standing tolerance, activity tolerance/endurance   Mobility     min A gait 30' and transfers  supervision gait and transfers  activity tolerance, gait, stairs   Communication               Safety/Cognition/ Behavioral Observations             Pain     1-2 tablet, Norco q 4hr prn  <3 on a 0-10 pain scale  assess pain q 4hr and prn   Skin     B-heels eschar with foam dressing, BLE with venous stasis ulcers with vasoline gauze and kerlix, Unstageable to sacrum with Santyl, moist to dry gauze, foam, MASD to groin, scrotom, thigh, perineum with MGP  no new skin breakdown while on rehab  assess skin q shift and prn and perform daily dressing changes     Rehab Goals Patient on target to meet rehab goals: Yes Rehab Goals Revised: none - pt's first conference and this is day of eval *See Care Plan and progress notes for long and short-term goals.      Barriers to Discharge   Current Status/Progress Possible Resolutions Date Resolved   Physician     Medical stability;IV antibiotics;Wound Care     See above  Therapies, adjust coumadin, wound care, neprho recs, optimize DM meds      Nursing   Hemodialysis;Wound Care             PT  Medical stability;Hemodialysis                 OT Medical stability;Wound Care;Hemodialysis               SLP            SW              Discharge Planning/Teaching Needs:  Pt to return to his home with his wife and extended family to assist pt.  Pt's wife will be offered family education prior to pt's d/c.   Team Discussion:  Pt's INR is  sub-therapeutic and [name] is making changes to pt's coumadin.  He may need to debride pt's sacral wound next week.  Pt is on dialysis at present on M/W/F.  [name] is also watching pt's CBGs for need for insulin adjustment.  Pt has managed his own colostomy since 2008.  Pt did well  on eval at moderate assist overall with supervision level goals.  Pt fatigues easily and needs rest breaks.  Family is supportive with many of them being in the medical field.  Revisions to Treatment Plan:  none    Continued Need for Acute Rehabilitation Level of Care: The patient requires daily medical management by a physician with specialized training in physical medicine and rehabilitation for the following conditions: Daily direction of a multidisciplinary physical rehabilitation program to ensure safe treatment while eliciting the highest outcome that is of practical value to the patient.: Yes Daily medical management of patient stability for increased activity during participation in an intensive rehabilitation regime.: Yes Daily analysis of laboratory values and/or radiology reports with any subsequent need for medication adjustment of medical intervention for : Cardiac problems;Pulmonary problems;Diabetes problems;Renal problems;Wound care problems   Silvia Hightower, [place]ck 08/05/2017, 9:23 AM

## 2017-08-05 NOTE — Progress Notes (Signed)
Social Work Assessment and Plan  Patient Details  Name: [name] MRN: [id number] Date of Birth: Sep 04, 1956  Today's Date: 08/04/2017  Problem List:  Patient Active Problem List   Diagnosis Date Noted  . Severe protein-calorie malnutrition ([place])   . Acute blood loss anemia   . Type 2 diabetes mellitus with peripheral neuropathy (HCC)   . Chronic systolic heart failure ([place])   . Colostomy care ([place])   . Chronic kidney disease (CKD), stage IV (severe) ([place])   . Dependence on renal dialysis ([place])   . Wounds, multiple   . Supratherapeutic INR   . MSSA bacteremia 08/02/2017  . ESRD (end stage renal disease) ([place])   . Debility   . Acute on chronic systolic CHF (congestive heart failure) ([place])   . Bacteremia due to methicillin susceptible Staphylococcus aureus (MSSA)   . Infected defibrillator ([place])   . Septic hip ([place])   . Staphylococcus aureus bacteremia with sepsis ([place])   . Severe sepsis with septic shock ([place])   . Cardiogenic shock ([place]) 07/10/2017  . Septic shock ([place]) 07/10/2017  . Cardiorenal syndrome with renal failure   . Thrombocytopenia ([place]) 06/14/2017  . GI bleed 04/27/2017  . Portal hypertensive gastropathy ([place]) 04/27/2017  . Hypokalemia 04/27/2017  . Esophageal dysphagia 04/13/2017  . Hepatic cirrhosis ([place]) 02/24/2017  . Acute on chronic systolic heart failure ([place])   . Biliary disease with obstruction   . AKI (acute kidney injury) ([place])   . Abnormal LFTs 05/10/2016  . Pain in the chest   . Right heart failure ([place]) 09/26/2015  . History of colonic polyps 09/24/2015  . Chronic atrial fibrillation ([place])   . Bleeding from colostomy stoma ([place]) 08/23/2015  . Anemia of chronic disease 07/21/2015  . Constipation 05/01/2015  . Transaminitis 05/01/2015  . NSTEMI- 04/23/15- Myoview scar- no cath 04/27/2015  . Acute respiratory failure ([place]) 04/23/2015  . Chronic cholecystitis 04/04/2015  . Cystitis with hematuria-April 2016 04/04/2015  . Chronically  elevated transaminase level-Amiodarone resumed 04/24/15 04/03/2015  . H/O ventricular fibrillation-March 2016 02/24/2015  . BiV ICD (St Jude) gen change 04/10/15 02/24/2015  . Hyperlipidemia   . Dietary noncompliance   . Orthostatic hypotension 07/19/2014  . OSA on CPAP   . GERD (gastroesophageal reflux disease) 06/05/2014  . Gallbladder polyp 03/06/2014  . DM type 2, uncontrolled, with renal complications ([place]) 88/10/314  . Dizziness 01/26/2014  . Obesity 12/12/2013  . Chronic hypotension 12/11/2013  . Chronic kidney disease, stage IV (severe) ([place]) 12/11/2013  . Chronic a-fib ([place])   . CAD with LCX disease and stents to LAD 07/10/2013  . Long term current use of anticoagulant therapy 03/23/2013  . History of rectal cancer   . Ischemic cardiomyopathy-EF 35% 04/24/15 echo    Past Medical History:  Past Medical History:  Diagnosis Date  . Adenocarcinoma of rectum ([place])    a. 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 AICD (automatic cardioverter/defibrillator) present 2002   BI V ICD  . Anemia   . CAD (coronary artery disease)    a. BMS to LAD 2001 at [facility] b. PTCA/atherectomy ramus and BMS to LAD 2009  . CHF (congestive heart failure) ([place])   . Cholelithiasis 06/2015  . Chronic kidney disease, stage IV (severe) ([place])   . Chronic systolic heart failure ([place])   . Cirrhosis ([place])   . Colostomy in place [facility])   . Dizziness    a. chronic. Admission for this 07/18/2014  . DM type 2, uncontrolled, with renal complications ([place])   .  Social Work Assessment and Plan  Patient Details  Name: [name] MRN: [id number] Date of Birth: Sep 04, 1956  Today's Date: 08/04/2017  Problem List:  Patient Active Problem List   Diagnosis Date Noted  . Severe protein-calorie malnutrition ([place])   . Acute blood loss anemia   . Type 2 diabetes mellitus with peripheral neuropathy (HCC)   . Chronic systolic heart failure ([place])   . Colostomy care ([place])   . Chronic kidney disease (CKD), stage IV (severe) ([place])   . Dependence on renal dialysis ([place])   . Wounds, multiple   . Supratherapeutic INR   . MSSA bacteremia 08/02/2017  . ESRD (end stage renal disease) ([place])   . Debility   . Acute on chronic systolic CHF (congestive heart failure) ([place])   . Bacteremia due to methicillin susceptible Staphylococcus aureus (MSSA)   . Infected defibrillator ([place])   . Septic hip ([place])   . Staphylococcus aureus bacteremia with sepsis ([place])   . Severe sepsis with septic shock ([place])   . Cardiogenic shock ([place]) 07/10/2017  . Septic shock ([place]) 07/10/2017  . Cardiorenal syndrome with renal failure   . Thrombocytopenia ([place]) 06/14/2017  . GI bleed 04/27/2017  . Portal hypertensive gastropathy ([place]) 04/27/2017  . Hypokalemia 04/27/2017  . Esophageal dysphagia 04/13/2017  . Hepatic cirrhosis ([place]) 02/24/2017  . Acute on chronic systolic heart failure ([place])   . Biliary disease with obstruction   . AKI (acute kidney injury) ([place])   . Abnormal LFTs 05/10/2016  . Pain in the chest   . Right heart failure ([place]) 09/26/2015  . History of colonic polyps 09/24/2015  . Chronic atrial fibrillation ([place])   . Bleeding from colostomy stoma ([place]) 08/23/2015  . Anemia of chronic disease 07/21/2015  . Constipation 05/01/2015  . Transaminitis 05/01/2015  . NSTEMI- 04/23/15- Myoview scar- no cath 04/27/2015  . Acute respiratory failure ([place]) 04/23/2015  . Chronic cholecystitis 04/04/2015  . Cystitis with hematuria-April 2016 04/04/2015  . Chronically  elevated transaminase level-Amiodarone resumed 04/24/15 04/03/2015  . H/O ventricular fibrillation-March 2016 02/24/2015  . BiV ICD (St Jude) gen change 04/10/15 02/24/2015  . Hyperlipidemia   . Dietary noncompliance   . Orthostatic hypotension 07/19/2014  . OSA on CPAP   . GERD (gastroesophageal reflux disease) 06/05/2014  . Gallbladder polyp 03/06/2014  . DM type 2, uncontrolled, with renal complications ([place]) 88/10/314  . Dizziness 01/26/2014  . Obesity 12/12/2013  . Chronic hypotension 12/11/2013  . Chronic kidney disease, stage IV (severe) ([place]) 12/11/2013  . Chronic a-fib ([place])   . CAD with LCX disease and stents to LAD 07/10/2013  . Long term current use of anticoagulant therapy 03/23/2013  . History of rectal cancer   . Ischemic cardiomyopathy-EF 35% 04/24/15 echo    Past Medical History:  Past Medical History:  Diagnosis Date  . Adenocarcinoma of rectum ([place])    a. 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 AICD (automatic cardioverter/defibrillator) present 2002   BI V ICD  . Anemia   . CAD (coronary artery disease)    a. BMS to LAD 2001 at [facility] b. PTCA/atherectomy ramus and BMS to LAD 2009  . CHF (congestive heart failure) ([place])   . Cholelithiasis 06/2015  . Chronic kidney disease, stage IV (severe) ([place])   . Chronic systolic heart failure ([place])   . Cirrhosis ([place])   . Colostomy in place [facility])   . Dizziness    a. chronic. Admission for this 07/18/2014  . DM type 2, uncontrolled, with renal complications ([place])   .  Social Work Assessment and Plan  Patient Details  Name: [name] MRN: [id number] Date of Birth: Sep 04, 1956  Today's Date: 08/04/2017  Problem List:  Patient Active Problem List   Diagnosis Date Noted  . Severe protein-calorie malnutrition ([place])   . Acute blood loss anemia   . Type 2 diabetes mellitus with peripheral neuropathy (HCC)   . Chronic systolic heart failure ([place])   . Colostomy care ([place])   . Chronic kidney disease (CKD), stage IV (severe) ([place])   . Dependence on renal dialysis ([place])   . Wounds, multiple   . Supratherapeutic INR   . MSSA bacteremia 08/02/2017  . ESRD (end stage renal disease) ([place])   . Debility   . Acute on chronic systolic CHF (congestive heart failure) ([place])   . Bacteremia due to methicillin susceptible Staphylococcus aureus (MSSA)   . Infected defibrillator ([place])   . Septic hip ([place])   . Staphylococcus aureus bacteremia with sepsis ([place])   . Severe sepsis with septic shock ([place])   . Cardiogenic shock ([place]) 07/10/2017  . Septic shock ([place]) 07/10/2017  . Cardiorenal syndrome with renal failure   . Thrombocytopenia ([place]) 06/14/2017  . GI bleed 04/27/2017  . Portal hypertensive gastropathy ([place]) 04/27/2017  . Hypokalemia 04/27/2017  . Esophageal dysphagia 04/13/2017  . Hepatic cirrhosis ([place]) 02/24/2017  . Acute on chronic systolic heart failure ([place])   . Biliary disease with obstruction   . AKI (acute kidney injury) ([place])   . Abnormal LFTs 05/10/2016  . Pain in the chest   . Right heart failure ([place]) 09/26/2015  . History of colonic polyps 09/24/2015  . Chronic atrial fibrillation ([place])   . Bleeding from colostomy stoma ([place]) 08/23/2015  . Anemia of chronic disease 07/21/2015  . Constipation 05/01/2015  . Transaminitis 05/01/2015  . NSTEMI- 04/23/15- Myoview scar- no cath 04/27/2015  . Acute respiratory failure ([place]) 04/23/2015  . Chronic cholecystitis 04/04/2015  . Cystitis with hematuria-April 2016 04/04/2015  . Chronically  elevated transaminase level-Amiodarone resumed 04/24/15 04/03/2015  . H/O ventricular fibrillation-March 2016 02/24/2015  . BiV ICD (St Jude) gen change 04/10/15 02/24/2015  . Hyperlipidemia   . Dietary noncompliance   . Orthostatic hypotension 07/19/2014  . OSA on CPAP   . GERD (gastroesophageal reflux disease) 06/05/2014  . Gallbladder polyp 03/06/2014  . DM type 2, uncontrolled, with renal complications ([place]) 88/10/314  . Dizziness 01/26/2014  . Obesity 12/12/2013  . Chronic hypotension 12/11/2013  . Chronic kidney disease, stage IV (severe) ([place]) 12/11/2013  . Chronic a-fib ([place])   . CAD with LCX disease and stents to LAD 07/10/2013  . Long term current use of anticoagulant therapy 03/23/2013  . History of rectal cancer   . Ischemic cardiomyopathy-EF 35% 04/24/15 echo    Past Medical History:  Past Medical History:  Diagnosis Date  . Adenocarcinoma of rectum ([place])    a. 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 AICD (automatic cardioverter/defibrillator) present 2002   BI V ICD  . Anemia   . CAD (coronary artery disease)    a. BMS to LAD 2001 at [facility] b. PTCA/atherectomy ramus and BMS to LAD 2009  . CHF (congestive heart failure) ([place])   . Cholelithiasis 06/2015  . Chronic kidney disease, stage IV (severe) ([place])   . Chronic systolic heart failure ([place])   . Cirrhosis ([place])   . Colostomy in place [facility])   . Dizziness    a. chronic. Admission for this 07/18/2014  . DM type 2, uncontrolled, with renal complications ([place])   .  Social Work Assessment and Plan  Patient Details  Name: [name] MRN: [id number] Date of Birth: Sep 04, 1956  Today's Date: 08/04/2017  Problem List:  Patient Active Problem List   Diagnosis Date Noted  . Severe protein-calorie malnutrition ([place])   . Acute blood loss anemia   . Type 2 diabetes mellitus with peripheral neuropathy (HCC)   . Chronic systolic heart failure ([place])   . Colostomy care ([place])   . Chronic kidney disease (CKD), stage IV (severe) ([place])   . Dependence on renal dialysis ([place])   . Wounds, multiple   . Supratherapeutic INR   . MSSA bacteremia 08/02/2017  . ESRD (end stage renal disease) ([place])   . Debility   . Acute on chronic systolic CHF (congestive heart failure) ([place])   . Bacteremia due to methicillin susceptible Staphylococcus aureus (MSSA)   . Infected defibrillator ([place])   . Septic hip ([place])   . Staphylococcus aureus bacteremia with sepsis ([place])   . Severe sepsis with septic shock ([place])   . Cardiogenic shock ([place]) 07/10/2017  . Septic shock ([place]) 07/10/2017  . Cardiorenal syndrome with renal failure   . Thrombocytopenia ([place]) 06/14/2017  . GI bleed 04/27/2017  . Portal hypertensive gastropathy ([place]) 04/27/2017  . Hypokalemia 04/27/2017  . Esophageal dysphagia 04/13/2017  . Hepatic cirrhosis ([place]) 02/24/2017  . Acute on chronic systolic heart failure ([place])   . Biliary disease with obstruction   . AKI (acute kidney injury) ([place])   . Abnormal LFTs 05/10/2016  . Pain in the chest   . Right heart failure ([place]) 09/26/2015  . History of colonic polyps 09/24/2015  . Chronic atrial fibrillation ([place])   . Bleeding from colostomy stoma ([place]) 08/23/2015  . Anemia of chronic disease 07/21/2015  . Constipation 05/01/2015  . Transaminitis 05/01/2015  . NSTEMI- 04/23/15- Myoview scar- no cath 04/27/2015  . Acute respiratory failure ([place]) 04/23/2015  . Chronic cholecystitis 04/04/2015  . Cystitis with hematuria-April 2016 04/04/2015  . Chronically  elevated transaminase level-Amiodarone resumed 04/24/15 04/03/2015  . H/O ventricular fibrillation-March 2016 02/24/2015  . BiV ICD ([employer] Jude) gen change 04/10/15 02/24/2015  . Hyperlipidemia   . Dietary noncompliance   . Orthostatic hypotension 07/19/2014  . OSA on CPAP   . GERD (gastroesophageal reflux disease) 06/05/2014  . Gallbladder polyp 03/06/2014  . DM type 2, uncontrolled, with renal complications ([place]) 88/10/314  . Dizziness 01/26/2014  . Obesity 12/12/2013  . Chronic hypotension 12/11/2013  . Chronic kidney disease, stage IV (severe) ([place]) 12/11/2013  . Chronic a-fib ([place])   . CAD with LCX disease and stents to LAD 07/10/2013  . Long term current use of anticoagulant therapy 03/23/2013  . History of rectal cancer   . Ischemic cardiomyopathy-EF 35% 04/24/15 echo    Past Medical History:  Past Medical History:  Diagnosis Date  . Adenocarcinoma of rectum ([place])    a. 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 AICD (automatic cardioverter/defibrillator) present 2002   BI V ICD  . Anemia   . CAD (coronary artery disease)    a. BMS to LAD 2001 at [facility] b. PTCA/atherectomy ramus and BMS to LAD 2009  . CHF (congestive heart failure) ([place])   . Cholelithiasis 06/2015  . Chronic kidney disease, stage IV (severe) ([place])   . Chronic systolic heart failure ([place])   . Cirrhosis ([place])   . Colostomy in place [facility])   . Dizziness    a. chronic. Admission for this 07/18/2014  . DM type 2, uncontrolled, with renal complications ([place])   .

## 2017-08-05 NOTE — Progress Notes (Signed)
Pt refusing CPAP

## 2017-08-05 NOTE — Progress Notes (Signed)
Patient ID: [name], male   DOB: [birth year], 61 y.o.   MRN: [id number]  [facility] Progress Note   Assessment/ Plan:   1. Staphylococcus bacteremia with septic/cardiogenic shock: Remains afebrile on Ancef. Repeat blood cultures from 07/11/17 are negative. Duration of antibiotic therapy outlined by ID team.  2. ESRD: Continue hemodialysis on a Monday/Wednesday/Friday schedule via a right brachiocephalic fistula. Process underway for outpatient dialysis unit placement to [facility] (he was previously a patient there). 3. Anemia: no overt loss, continue Aranesp and supplement iron stores with IV iron at HD 4. CKD-MBD:hyperphosphatemia - on Velphoro/renal diet will monitor trend. 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 5. Nutrition:with evidence of protein-calorie malnutrition likely with poor wound healing. Continue ONS  6. Deconditioning: now ongoing rehabilitation in CIR.  Subjective:   Had a comfortable night and good morning so far with vomiting earlier today.    Objective:   BP (!) 95/55 (BP Location: Left Arm)   Pulse 73   Temp 97.8 F (36.6 C) (Oral)   Resp 16   Ht 5\' 10"  (1.778 m)   Wt 76 kg (167 lb 8.8 oz)   SpO2 96%   BMI 24.04 kg/m   Physical Exam: Gen: Comfortably sitting up in wheelchair CVS: Pulse regular rhythm, normal rate, 2/6 HSM over apex Resp:Decreased breath sounds over bases, no rales QQI:WLNL, flat, non-tender, bowel sounds normal Ext:1+ LE edema with both lower legs in gauze dressing. Right brachiocephalic fistula with good thrill.  Labs: BMET  Recent Labs Lab 07/30/17 0740 07/31/17 0500 08/01/17 0724 08/02/17 0500 08/02/17 1843 08/03/17 0843 08/03/17 2002  NA 132* 133* 131* 132* 132* 129* 129*  K 3.6 3.5 3.3* 3.6 3.7 3.3* 3.6  CL 96* 96* 94* 95* 94* 92* 92*  CO2 23 24 23 26 27 26 26   GLUCOSE 100* 75 137* 100* 157* 123* 97  BUN 74* 54* 59* 44* 45* 48* 54*  CREATININE 4.35* 3.65* 4.09* 3.45* 3.57* 3.95*  4.26*  CALCIUM 8.7* 8.2* 8.6* 8.4* 8.7* 8.6* 8.5*  PHOS 6.7* 5.5* 5.9* 5.0* 5.0* 5.0* 5.5*   CBC  Recent Labs Lab 08/01/17 0724 08/02/17 1840 08/03/17 0843 08/03/17 2002  WBC 7.0 9.9 8.4 8.6  HGB 9.3* 9.2* 9.8* 9.6*  HCT 29.1* 29.7* 31.7* 29.8*  MCV 93.3 95.5 95.5 94.3  PLT 143* 143* 150 151   Medications:    . colchicine  0.3 mg Oral Daily  . collagenase  1 application Topical Daily  . darbepoetin (ARANESP) injection - NON-DIALYSIS  200 mcg Subcutaneous Q Wed-1800  . feeding supplement (ENSURE ENLIVE)  237 mL Oral BID BM  . feeding supplement (PRO-STAT SUGAR FREE 64)  30 mL Oral BID  . hydrocerin   Topical BID  . insulin aspart  0-5 Units Subcutaneous QHS  . insulin aspart  3 Units Subcutaneous TID WC  . insulin glargine  10 Units Subcutaneous QHS  . midodrine  10 mg Oral TID WC  . multivitamin  1 tablet Oral QHS  . sucroferric oxyhydroxide  500 mg Oral TID WC  . Warfarin - Pharmacist Dosing Inpatient   Does not apply G9211   Elmarie Shiley, MD 08/05/2017, 10:21 AM

## 2017-08-05 NOTE — Progress Notes (Signed)
Pt did not tolerate HD today d/t SBP in the 80's the entire tx, even without pulling fluid (UF off).  Pt states that he normally runs with his systolic in the 17'R at dialysis and they continue to pull fluid.  I explained to pt that we need a MD order to be able to do that here.

## 2017-08-05 NOTE — Procedures (Signed)
Patient seen on Hemodialysis. QB 400, UF goal 1L Treatment adjusted as needed.  Robert Shiley MD [facility]. Office # [phone] Pager # [phone] 5:55 PM

## 2017-08-05 NOTE — Progress Notes (Signed)
ANTICOAGULATION CONSULT NOTE - Follow Up Consult  Pharmacy Consult for Coumadin Indication: atrial fibrillation   No Known Allergies  Patient Measurements: Height: 5\' 10"  (177.8 cm) Weight: 167 lb 8.8 oz (76 kg) IBW/kg (Calculated) : 73  Vital Signs: Temp: 97.8 F (36.6 C) (08/31 0500) Temp Source: Oral (08/31 0500) BP: 95/55 (08/31 0500) Pulse Rate: 73 (08/31 0500)  Labs:  Recent Labs  08/02/17 1840 08/02/17 1843 08/03/17 0406 08/03/17 0843 08/03/17 2002 08/04/17 0421 08/05/17 0358  HGB 9.2*  --   --  9.8* 9.6*  --   --   HCT 29.7*  --   --  31.7* 29.8*  --   --   PLT 143*  --   --  150 151  --   --   LABPROT  --   --  34.1*  --   --  32.7* 28.1*  INR  --   --  3.40  --   --  3.22 2.66  CREATININE  --  3.57*  --  3.95* 4.26*  --   --     Estimated Creatinine Clearance: 18.6 mL/min (A) (by C-G formula based on SCr of 4.26 mg/dL (H)).   Assessment: 60yom continues on coumadin for afib. Dose was held for 3 days due to supratherapeutic INR, resumed 8/26. INR trended up again to 3.4 on 8/29 and dose was held. Lower dose given last night and INR back within therapeutic range at 2.66.   PTA dose: 2mg  daily except 1mg  Tue/Thur  Goal of Therapy:  INR 2-3 Monitor platelets by anticoagulation protocol: Yes   Plan:  1) Coumadin 2mg  tonight 2) Daily INR   Nena Jordan, PharmD, BCPS 08/05/2017 10:44 AM

## 2017-08-05 NOTE — Progress Notes (Signed)
Occupational Therapy Session Note  Patient Details  Name: [name] MRN: [id number] Date of Birth: 03-12-1956  Today's Date: 08/05/2017 OT Individual Time: 0830-0930 OT Individual Time Calculation (min): 60 min    Short Term Goals: Week 1:  OT Short Term Goal 1 (Week 1): Pt will complete bathing with min assist at sit > stand level OT Short Term Goal 2 (Week 1): Pt will complete LB dressing with mod assist at sit > stand level OT Short Term Goal 3 (Week 1): Pt will complete toilet transfer with min assist  OT Short Term Goal 4 (Week 1): Pt will complete 2 grooming tasks in standing with min assist for increased standing tolerance  Skilled Therapeutic Interventions/Progress Updates:      Pt seen for BADL retraining of toileting, bathing, and dressing with a focus on safe mobility with RW and dynamic standing balance.  Pt participated extremely well this am.  Pt completed ostomy care sitting EOB with set up only.  Ambulated to bathroom with RW with steadying A and close S for all sit to stands.  He transferred to toilet steadying A to close S and then walked back to wc to bathe at sink.  He was able to stand with S for clothing management over hips.  He has difficulty reaching to feet and cannot cross legs without discomfort, so introduced a reacher to don underwear and pants over feet.  Pt was able to use reacher with min cues.  He did need occasional cues with problem solving for example with sequencing of steps to increase efficiency of self care routine.  Pt then worked on  UE postural exercises with isometric holds of sitting upright for 1 min 5x and 3 sets of 15 reps of rows using orange theraband.  Pt's wife arrived at end of session and reviewed pt's progress with her. Pt in w/c with all needs met.  Therapy Documentation Precautions:  Precautions Precautions: Fall Restrictions Weight Bearing Restrictions: No    Vital Signs: Therapy Vitals Temp: 97.8 F (36.6 C) Temp Source:  Oral Pulse Rate: 73 Resp: 16 BP: (!) 95/55 Patient Position (if appropriate): Lying Oxygen Therapy SpO2: 96 % O2 Device: Not Delivered Pain: Pain Assessment Pain Score: 0-No pain   ADL:   See Function Navigator for Current Functional Status.   Therapy/Group: Individual Therapy  [place] 08/05/2017, 8:27 AM

## 2017-08-05 NOTE — Progress Notes (Signed)
Hypoglycemic Event  CBG: 58  Treatment: 15 GM carbohydrate snack  Symptoms: None  Follow-up CBG: Time:2221 CBG Result:70  Possible Reasons for Event: Inadequate meal intake  Comments/MD notified MD to notify    [facility]

## 2017-08-05 NOTE — Progress Notes (Signed)
[place] PHYSICAL MEDICINE & REHABILITATION     PROGRESS NOTE  Subjective/Complaints:  Pt seen laying in bed this AM.  He slept well overnight.  He believes he is getting stronger.   ROS: Denies CP, SOB, N/V/D.  Objective: Vital Signs: Blood pressure (!) 95/55, pulse 73, temperature 97.8 F (36.6 C), temperature source Oral, resp. rate 16, height 5\' 10"  (1.778 m), weight 76 kg (167 lb 8.8 oz), SpO2 96 %. No results found.  Recent Labs  08/03/17 0843 08/03/17 2002  WBC 8.4 8.6  HGB 9.8* 9.6*  HCT 31.7* 29.8*  PLT 150 151    Recent Labs  08/03/17 0843 08/03/17 2002  NA 129* 129*  K 3.3* 3.6  CL 92* 92*  GLUCOSE 123* 97  BUN 48* 54*  CREATININE 3.95* 4.26*  CALCIUM 8.6* 8.5*   CBG (last 3)   Recent Labs  08/04/17 1704 08/04/17 2112 08/05/17 0633  GLUCAP 102* 100* 76    Wt Readings from Last 3 Encounters:  08/05/17 76 kg (167 lb 8.8 oz)  08/02/17 70.2 kg (154 lb 12.2 oz)  06/21/17 79.2 kg (174 lb 8 oz)    Physical Exam:  BP (!) 95/55 (BP Location: Left Arm)   Pulse 73   Temp 97.8 F (36.6 C) (Oral)   Resp 16   Ht 5\' 10"  (1.778 m)   Wt 76 kg (167 lb 8.8 oz)   SpO2 96%   BMI 24.04 kg/m  Constitutional: No distress. Vital signs reviewed.  HENT: Normocephalic and atraumatic.  Eyes: EOM are normal. No discharge.  Cardiovascular: Cardiac rate controlled. No JVD. Respiratory: Effort normal and breath sounds normal.  GI: Bowel sounds are normal. He exhibits no distension.  Colostomy in place . Musc: No edema. No tenderness Neurological: Alert.  Motor: b/l UE 4/5 deltoid, biceps, triceps, wrist ext, HI.  LLE: 4-/5 HF and 4+/5 KE and 5/5 ADF/PF.  [name]: 4-/5 HF, 4-4+/5 KE and 4+/5 ADF/PF.  Skin: Multiple wounds Psych: flat, cooperative   Assessment/Plan: 1. Functional deficits secondary to debility which require 3+ hours per day of interdisciplinary therapy in a comprehensive inpatient rehab setting. Physiatrist is providing close team supervision and  24 hour management of active medical problems listed below. Physiatrist and rehab team continue to assess barriers to discharge/monitor patient progress toward functional and medical goals.  Function:  Bathing Bathing position   Position: Wheelchair/chair at sink  Bathing parts Body parts bathed by patient: Right arm, Left arm, Chest, Abdomen, Front perineal area, Right upper leg, Left upper leg Body parts bathed by helper: Buttocks, Right lower leg, Left lower leg, Back  Bathing assist Assist Level:  (Mod assist)      Upper Body Dressing/Undressing Upper body dressing   What is the patient wearing?: Pull over shirt/dress     Pull over shirt/dress - Perfomed by patient: Thread/unthread right sleeve, Thread/unthread left sleeve, Put head through opening, Pull shirt over trunk          Upper body assist Assist Level: Set up   Set up : To obtain clothing/put away  Lower Body Dressing/Undressing Lower body dressing   What is the patient wearing?: Underwear, Pants, Non-skid slipper socks Underwear - Performed by patient: Thread/unthread left underwear leg, Pull underwear up/down, Thread/unthread right underwear leg Underwear - Performed by helper: Thread/unthread right underwear leg Pants- Performed by patient: Thread/unthread right pants leg, Thread/unthread left pants leg, Pull pants up/down Pants- Performed by helper: Thread/unthread right pants leg   Non-skid slipper socks- Performed  by helper: Don/doff right sock, Don/doff left sock                  Lower body assist Assist for lower body dressing:  (Mod assist)      Toileting Toileting          Toileting assist     Transfers Chair/bed transfer     Chair/bed transfer assist level: Touching or steadying assistance (Pt > 75%)       Locomotion Ambulation     Max distance: 30 Assist level: Touching or steadying assistance (Pt > 75%)   Wheelchair   Type: Manual Max wheelchair distance: 30 Assist Level:  Supervision or verbal cues  Cognition Comprehension Comprehension assist level: Follows basic conversation/direction with no assist  Expression Expression assist level: Expresses basic needs/ideas: With extra time/assistive device  Social Interaction Social Interaction assist level: Interacts appropriately 90% of the time - Needs monitoring or encouragement for participation or interaction.  Problem Solving Problem solving assist level: Solves basic 50 - 74% of the time/requires cueing 25 - 49% of the time  Memory Memory assist level: Requires cues to use assistive device, Recognizes or recalls 90% of the time/requires cueing < 10% of the time    Medical Problem List and Plan:  1. Severe debilitation/encephalopathy secondary to respiratory failure, CHF, MSSA sepsis   Cont CIR 2. DVT Prophylaxis/Anticoagulation: Chronic Coumadin therapy and monitor for rebleeding episodes   INR therpeutic on 8/31 3. Pain Management: Hydrocodone as needed  4. Mood: Provide emotional support  5. Neuropsych: This patient is capable of making decisions on his own behalf.  6. Skin/Wound Care/lower extremity leg wound: Routine skin checks with skin care as directed   Appreciate WOC recs  Will plan to debride next week 7. Fluids/Electrolytes/Nutrition: Routine I&Os 8. CKD stage IV. Followed closely by renal services.   Cr 4.26 on 8/29, currently on dialysis, appreciate nephro recs  Labs ordered for Monday  Cont to monitor 9. MSSA bacteremia. Intravenous cefazolin through 08/21/2017 then chronic Keflex and follow-up per infectious disease  10. 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 Routine colostomy care  11. CAD with implantable defibrillator. Follow-up cardiology services. No plan to extract device  12. Chronic systolic congestive heart failure/ischemic cardiomyopathy. Monitor for any signs of fluid overload.  Filed Weights   08/04/17 0105 08/04/17 0605 08/05/17 0500  Weight: 76 kg (167  lb 8.8 oz) 76.5 kg (168 lb 11.9 oz) 76 kg (167 lb 8.8 oz)  13. Diabetes mellitus and peripheral neuropathy. Lantus insulin 10 units daily at bedtime.   Relatively controlled 8/31 14. Orthostasis. Continue ProAmatine 10 mg 3 times a day.   Monitor orthostatic with activity.  15. Acute on chronic anemia. Continue Aranesp.   Hb 9.6 on 8/29  Labs ordered for Monday 16. History of gout. Colchicine 0.3 mg daily. Monitor for any gout flareups  17. Malnourishment  Supplement initiated 8/29  LOS (Days) 3 A FACE TO FACE EVALUATION WAS PERFORMED  Kwabena Strutz Lorie Phenix 08/05/2017 10:37 AM

## 2017-08-05 NOTE — Progress Notes (Signed)
[facility]  New pt for [facility] this hospital admission though we have provided [facility] services in the past.   [facility] will provide [facility] , PT, OT and [facility] services for home IV ABX upon DC.  [facility] will be prepared for DC when ordered by Rehab Team.   If patient discharges after hours, please call [phone].   Larry Sierras 08/05/2017, 1:50 PM

## 2017-08-05 NOTE — Progress Notes (Signed)
Physical Therapy Note  Patient Details  Name: KURON DOCKEN MRN: [id number] Date of Birth: 11/14/1956 Today's Date: 08/05/2017    Time: 1045-1115 30 minutes  1:1 Pt with no c/o pain. Pt states he had 2 episodes of vomiting prior to session.  RN made aware.  Pt willing to attempt PT session. Pt propels w/c 50' x 2 with bilat UEs with supervision. Pt then with another episode of vomiting. RN present.  BP 102/58, blood glucose 116.  Pt requests to go back to bed to rest and to attempt next therapy session.  Pt performs sit to stand and stand pivot transfer to bed with RW with min A.  Min A to lift Rt LE into bed. Pt left in bed with needs at hand and wife present.  Missed 45 minutes skilled PT.   Lenay Lovejoy 08/05/2017, 11:21 AM

## 2017-08-05 NOTE — Progress Notes (Signed)
Occupational Therapy Session Note  Patient Details  Name: [name] MRN: [id number] Date of Birth: [birth year]  Today's Date: 08/05/2017 OT Individual Time: 1300-1316 OT Individual Time Calculation (min): 16 min  and Today's Date: 08/05/2017 OT Missed Time: 29 Minutes Missed Time Reason: Patient ill (comment)   Short Term Goals: Week 1:  OT Short Term Goal 1 (Week 1): Pt will complete bathing with min assist at sit > stand level OT Short Term Goal 2 (Week 1): Pt will complete LB dressing with mod assist at sit > stand level OT Short Term Goal 3 (Week 1): Pt will complete toilet transfer with min assist  OT Short Term Goal 4 (Week 1): Pt will complete 2 grooming tasks in standing with min assist for increased standing tolerance  Skilled Therapeutic Interventions/Progress Updates:    Attempted to engage pt in therapeutic activity to increase overall activity tolerance.  Pt received in bed reporting still feeling nauseous from AM session and multiple episodes of vomiting.  Engaged in discussion regarding therapy process and current short term and long term goals and pt progress towards goals.  Pt motivated by progress and frustrated by nausea and vomiting today.  Pt missed 29 mins due to feeling nauseous and requesting to rest.  Will continue to follow as able.  Therapy Documentation Precautions:  Precautions Precautions: Fall Restrictions Weight Bearing Restrictions: No General: General OT Amount of Missed Time: 29 Minutes Pain:  Pt with no c/o pain  See Function Navigator for Current Functional Status.   Therapy/Group: Individual Therapy  Simonne Come 08/05/2017, 3:22 PM

## 2017-08-05 NOTE — Progress Notes (Signed)
Physical Therapy Session Note  Patient Details  Name: [name] MRN: [id number] Date of Birth: 05/28/1956  Today's Date: 08/05/2017 PT Individual Time: 1430-1458 PT Individual Time Calculation (min): 28 min   Short Term Goals: Week 1:  PT Short Term Goal 1 (Week 1): pt will perform gait with min A 50' in controlled environment PT Short Term Goal 2 (Week 1): pt will perform stair negotiation x 1 step with min A  Skilled Therapeutic Interventions/Progress Updates:   Pt supine in bed upon arrival and agreeable to therapy, no c/o pain. C/o dizziness and reports he has has multiple bouts of emesis today. He agreed to participate and take therapy slow. He transferred to EOB w/ supervision and tolerated 15 min of static sitting at EOB while eating a snack w/ no clinical signs or symptoms of physiologic intolerance. Performed sit<>stand and static standing balance w/o UE support for 2 min. Pt requesting to return to supine and ended session supine in bed and in care of RN, all needs met.   Therapy Documentation Precautions:  Precautions Precautions: Fall Restrictions Weight Bearing Restrictions: No General: PT Amount of Missed Time (min): 45 Minutes PT Missed Treatment Reason: Patient ill (Comment) (vomiting) Vital Signs: Therapy Vitals BP: (!) 102/58 Patient Position (if appropriate): Sitting  See Function Navigator for Current Functional Status.   Therapy/Group: Individual Therapy   K Arnette 08/05/2017, 2:59 PM  

## 2017-08-06 ENCOUNTER — Inpatient Hospital Stay (HOSPITAL_COMMUNITY): Payer: Medicare Other | Admitting: Physical Therapy

## 2017-08-06 ENCOUNTER — Inpatient Hospital Stay (HOSPITAL_COMMUNITY): Payer: Medicare Other | Admitting: Occupational Therapy

## 2017-08-06 DIAGNOSIS — E162 Hypoglycemia, unspecified: Secondary | ICD-10-CM

## 2017-08-06 LAB — GLUCOSE, CAPILLARY
GLUCOSE-CAPILLARY: 89 mg/dL (ref 65–99)
GLUCOSE-CAPILLARY: 176 mg/dL — AB (ref 65–99)
Glucose-Capillary: 107 mg/dL — ABNORMAL HIGH (ref 65–99)
Glucose-Capillary: 132 mg/dL — ABNORMAL HIGH (ref 65–99)

## 2017-08-06 LAB — PROTIME-INR
INR: 2.77
Prothrombin Time: 29 seconds — ABNORMAL HIGH (ref 11.4–15.2)

## 2017-08-06 MED ORDER — WARFARIN SODIUM 1 MG PO TABS
1.0000 mg | ORAL_TABLET | Freq: Once | ORAL | Status: AC
  Administered 2017-08-06: 1 mg via ORAL
  Filled 2017-08-06: qty 1

## 2017-08-06 MED ORDER — INSULIN GLARGINE 100 UNIT/ML ~~LOC~~ SOLN
6.0000 [IU] | Freq: Every day | SUBCUTANEOUS | Status: DC
  Administered 2017-08-06: 6 [IU] via SUBCUTANEOUS
  Filled 2017-08-06: qty 0.06

## 2017-08-06 NOTE — Progress Notes (Signed)
[place] PHYSICAL MEDICINE & REHABILITATION     PROGRESS NOTE  Subjective/Complaints:  Pt seen laying in bed this AM.  He was noted to by hypoglycemic after HD last night. He slept well overnight.  ROS: Denies CP, SOB, N/V/D.  Objective: Vital Signs: Blood pressure (!) 113/57, pulse 74, temperature 98.1 F (36.7 C), temperature source Oral, resp. rate 18, height 5\' 10"  (1.778 m), weight 78 kg (171 lb 15.3 oz), SpO2 100 %. No results found.  Recent Labs  08/03/17 2002 08/05/17 1630  WBC 8.6 9.4  HGB 9.6* 9.3*  HCT 29.8* 30.5*  PLT 151 92*    Recent Labs  08/03/17 2002 08/05/17 1645  NA 129* 133*  K 3.6 3.2*  CL 92* 98*  GLUCOSE 97 77  BUN 54* 28*  CREATININE 4.26* 2.53*  CALCIUM 8.5* 8.2*   CBG (last 3)   Recent Labs  08/05/17 2221 08/05/17 2259 08/06/17 0643  GLUCAP 70 87 107*    Wt Readings from Last 3 Encounters:  08/06/17 78 kg (171 lb 15.3 oz)  08/02/17 70.2 kg (154 lb 12.2 oz)  06/21/17 79.2 kg (174 lb 8 oz)    Physical Exam:  BP (!) 113/57 (BP Location: Left Arm)   Pulse 74   Temp 98.1 F (36.7 C) (Oral)   Resp 18   Ht 5\' 10"  (1.778 m)   Wt 78 kg (171 lb 15.3 oz)   SpO2 100%   BMI 24.67 kg/m  Constitutional: No distress. Vital signs reviewed.  HENT: Normocephalic and atraumatic.  Eyes: EOM are normal. No discharge.  Cardiovascular: Cardiac rate controlled. No JVD. Respiratory: Effort normal and breath sounds normal.  GI: Bowel sounds are normal. He exhibits no distension.  Colostomy in place . Musc: No edema. No tenderness Neurological: Alert.  Motor: b/l UE 4/5 deltoid, biceps, triceps, wrist ext, HI (stable).  LLE: 4-/5 HF and 4+/5 KE and 5/5 ADF/PF.  [name]: 4-/5 HF, 4-4+/5 KE and 4+/5 ADF/PF.  Skin: Multiple wounds Psych: flat, cooperative   Assessment/Plan: 1. Functional deficits secondary to debility which require 3+ hours per day of interdisciplinary therapy in a comprehensive inpatient rehab setting. Physiatrist is providing  close team supervision and 24 hour management of active medical problems listed below. Physiatrist and rehab team continue to assess barriers to discharge/monitor patient progress toward functional and medical goals.  Function:  Bathing Bathing position   Position: Wheelchair/chair at sink  Bathing parts Body parts bathed by patient: Right arm, Left arm, Chest, Abdomen, Front perineal area, Right upper leg, Left upper leg Body parts bathed by helper: Buttocks, Right lower leg, Left lower leg, Back  Bathing assist Assist Level:  (Mod assist)      Upper Body Dressing/Undressing Upper body dressing   What is the patient wearing?: Pull over shirt/dress     Pull over shirt/dress - Perfomed by patient: Thread/unthread right sleeve, Thread/unthread left sleeve, Put head through opening, Pull shirt over trunk          Upper body assist Assist Level: Set up   Set up : To obtain clothing/put away  Lower Body Dressing/Undressing Lower body dressing   What is the patient wearing?: Underwear, Pants, Non-skid slipper socks Underwear - Performed by patient: Thread/unthread left underwear leg, Pull underwear up/down, Thread/unthread right underwear leg Underwear - Performed by helper: Thread/unthread right underwear leg Pants- Performed by patient: Thread/unthread right pants leg, Thread/unthread left pants leg, Pull pants up/down Pants- Performed by helper: Thread/unthread right pants leg   Non-skid  slipper socks- Performed by helper: Don/doff right sock, Don/doff left sock                  Lower body assist Assist for lower body dressing:  (Mod assist)      Toileting Toileting Toileting activity did not occur: No continent bowel/bladder event   Toileting steps completed by helper: Adjust clothing prior to toileting, Performs perineal hygiene, Adjust clothing after toileting (per Elmo Putt, NT report)    Toileting assist     Transfers Chair/bed transfer     Chair/bed  transfer assist level: Touching or steadying assistance (Pt > 75%)       Locomotion Ambulation     Max distance: 30 Assist level: Touching or steadying assistance (Pt > 75%)   Wheelchair   Type: Manual Max wheelchair distance: 30 Assist Level: Supervision or verbal cues  Cognition Comprehension Comprehension assist level: Follows basic conversation/direction with no assist  Expression Expression assist level: Expresses basic needs/ideas: With extra time/assistive device  Social Interaction Social Interaction assist level: Interacts appropriately 90% of the time - Needs monitoring or encouragement for participation or interaction.  Problem Solving Problem solving assist level: Solves basic 50 - 74% of the time/requires cueing 25 - 49% of the time  Memory Memory assist level: Recognizes or recalls 90% of the time/requires cueing < 10% of the time    Medical Problem List and Plan:  1. Severe debilitation/encephalopathy secondary to respiratory failure, CHF, MSSA sepsis   Cont CIR 2. DVT Prophylaxis/Anticoagulation: Chronic Coumadin therapy and monitor for rebleeding episodes   INR therpeutic on 8/31 3. Pain Management: Hydrocodone as needed  4. Mood: Provide emotional support  5. Neuropsych: This patient is capable of making decisions on his own behalf.  6. Skin/Wound Care/lower extremity leg wound: Routine skin checks with skin care as directed   Appreciate WOC recs  Will plan to debride next week 7. Fluids/Electrolytes/Nutrition: Routine I&Os 8. CKD stage IV. Followed closely by renal services.   Cr 2.53 on 8/31, currently on dialysis, appreciate nephro recs  Labs ordered for Monday  Cont to monitor 9. MSSA bacteremia. Intravenous cefazolin through 08/21/2017 then chronic Keflex and follow-up per infectious disease  10. 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 Routine colostomy care  11. CAD with implantable defibrillator. Follow-up cardiology services. No  plan to extract device  12. Chronic systolic congestive heart failure/ischemic cardiomyopathy. Monitor for any signs of fluid overload.  Filed Weights   08/05/17 1600 08/05/17 2018 08/06/17 0525  Weight: 81 kg (178 lb 9.2 oz) 80.5 kg (177 lb 7.5 oz) 78 kg (171 lb 15.3 oz)  13. Diabetes mellitus and peripheral neuropathy.   Lantus insulin 10 decreased to 6U due to hypoglycemia on 9/1 14. Orthostasis. Continue ProAmatine 10 mg 3 times a day.   Monitor orthostatic with activity.  15. Acute on chronic anemia. Continue Aranesp.   Hb 9.3 on 8/31  Labs ordered for Monday 16. History of gout. Colchicine 0.3 mg daily. Monitor for any gout flareups  17. Malnourishment  Supplement initiated 8/29  LOS (Days) 4 A FACE TO FACE EVALUATION WAS PERFORMED  Ankit Lorie Phenix 08/06/2017 11:03 AM

## 2017-08-06 NOTE — Progress Notes (Signed)
ANTICOAGULATION CONSULT NOTE - Follow Up Consult  Pharmacy Consult for Coumadin Indication: atrial fibrillation   No Known Allergies  Patient Measurements: Height: 5\' 10"  (177.8 cm) Weight: 171 lb 15.3 oz (78 kg) IBW/kg (Calculated) : 73  Vital Signs: Temp: 98.1 F (36.7 C) (09/01 0525) Temp Source: Oral (09/01 0525) BP: 113/57 (09/01 0525) Pulse Rate: 74 (09/01 0525)  Labs:  Recent Labs  08/03/17 2002 08/04/17 0421 08/05/17 0358 08/05/17 1630 08/05/17 1645 08/06/17 0427  HGB 9.6*  --   --  9.3*  --   --   HCT 29.8*  --   --  30.5*  --   --   PLT 151  --   --  92*  --   --   LABPROT  --  32.7* 28.1*  --   --  29.0*  INR  --  3.22 2.66  --   --  2.77  CREATININE 4.26*  --   --   --  2.53*  --     Estimated Creatinine Clearance: 31.3 mL/min (A) (by C-G formula based on SCr of 2.53 mg/dL (H)).   Assessment: 60yom continues on coumadin for afib. Dose was held for 3 days due to supratherapeutic INR, resumed 8/26. INR trended up again to 3.4 on 8/29 and dose was held. INR is 2.77 today after 2mg  yesterday and 0.5mg  the day before. Mr. Cavazos seems to be very sensitive to warfarin, so will use a lower dose today.  He may require a different dosing regimen from his PTA regimen.  PTA dose: 2mg  daily except 1mg  Tue/Thur  Goal of Therapy:  INR 2-3 Monitor platelets by anticoagulation protocol: Yes   Plan:  1) Coumadin 1mg  tonight 2) Daily INR, monitor s/sx bleeding  Patterson Hammersmith PharmD PGY1 Pharmacy Practice Resident 08/06/2017 12:07 PM Pager: [phone]

## 2017-08-06 NOTE — Progress Notes (Signed)
Occupational Therapy Session Note  Patient Details  Name: [name] MRN: [id number] Date of Birth: 1956-11-24  Today's Date: 08/06/2017 OT Individual Time: 0902-1002 and 1415-1500 OT Individual Time Calculation (min): 60 min and 45 min   Short Term Goals: Week 1:  OT Short Term Goal 1 (Week 1): Pt will complete bathing with min assist at sit > stand level OT Short Term Goal 2 (Week 1): Pt will complete LB dressing with mod assist at sit > stand level OT Short Term Goal 3 (Week 1): Pt will complete toilet transfer with min assist  OT Short Term Goal 4 (Week 1): Pt will complete 2 grooming tasks in standing with min assist for increased standing tolerance    Skilled Therapeutic Interventions/Progress Updates:    Tx focus on balance, functional ambulation with device, and endurance during self care retraining.   Pt greeted supine in bed, reported feeling better today with no episodes of vomiting. Agreeable to complete BADLs. Pt transitioning to EOB with extra time and noticeable difficulty with lifting Rt leg. Pt ambulated with RW to sink with close supervision. Pt using reacher to thread LB garments and stood at sink with close supervision for mgt of clothing. Pt exhibiting good safety awareness with w/c during sit<stand transitions, though unable to obtain figure 4. Pt completing ostomy care EOB with setup, and then returned to supine for OT to wash bilateral feet and apply lotion. LEs elevated with heels floated. Pt repositioned for comfort and was left in bed with all needs within reach at time of departure.    2nd Session 1:1 tx (45 min) Tx focus on adaptive dressing skills for donning footwear.  Pt greeted supine in bed with spouse present. Agreeable to tx. Pt engaging in AE training EOB with use of sock aide and shoe funnel. Pt able to don both gripper socks/thin socks with instruction and extra time. OT changed Rt foot dressing after it came off when he initially doffed his gripper  sock. Dressings remained intact for remainder of session. He was able to don Lt shoe with supervision and shoe funnel. Required assist for Rt shoe due to R LE mobility deficits/pain. Pt actively using reacher to assist during these tasks and encouraged that he was successful with AE use. Requested for AE to be left in room for additional practice. Min A for elevating Rt leg when returning to supine. Pt repositioned in bed for comfort with all needs within reach and spouse present.   He was able to don thin sock/sneaker and fasten sneaker on Lt foot in figure 4 position, however, c/o L LE pain. No c/o pain while using AE for donning footwear.   Therapy Documentation Precautions:  Precautions Precautions: Fall Restrictions Weight Bearing Restrictions: No Pain: Pain during certain functional LE movements. Subsides when these movements are avoided.  Pain Assessment Pain Score: 0-No pain ADL:      See Function Navigator for Current Functional Status.   Therapy/Group: Individual Therapy  Antwoin Lackey A Shanaia Sievers 08/06/2017, 12:08 PM

## 2017-08-06 NOTE — Progress Notes (Addendum)
Physical Therapy Session Note  Patient Details  Name: [name] MRN: [id number] Date of Birth: 02-07-1956  Today's Date: 08/06/2017 PT Individual Time: 1030-1100 AND 1605-1700 PT Individual Time Calculation (min): 30 min 55 min   Short Term Goals: Week 1:  PT Short Term Goal 1 (Week 1): pt will perform gait with min A 50' in controlled environment PT Short Term Goal 2 (Week 1): pt will perform stair negotiation x 1 step with min A  Skilled Therapeutic Interventions/Progress Updates:   Pt received supine in bed and agreeable to PT. Supine>sit transfer with supervision assist and  Min cues for safety. Ambulatory transfer to [facility] with supervision assist from PT and BUE on RW.   Gait training in day room x 64f with supervision-min assist from PT and BUE support of RW.   Nustep reciprocal movement training and endurance training 2 bouts x 5 minutes with 1 therapeutic rest break. Pt noted to have increased [name] ROM at end of Nustep endurance training and reduced knee pain.   WC mobility x 1270fback to pt room with supervision assist and min cues for doorway management.    Once Pt returned to room,he performed stand pivot transfer to bed with supervision assist. Sit>supine completed withmin assist from PT. Pt left supine in bed with call bell in reach and all needs met.   Session 2.   Pt received sitting EOB and agreeable to PT  [facility].   WC mobility training instructed by PT [id number] supervision assist and min cues for posture in chair to improve mechanical advantage.   Gait training instructed by PT x 75ft58f 40 ft with supervision assist and min cues for posture and gait pattern. PT also instructed pt in Stair negotiation x 4 steps with min preogressing to mod assist due to fatigue. BUE support on rails.   Nustep endurance and strength training x 10 minutes with 3 rest breaks. Resistance: level 3>4. Pt noted to have  decreased R knee pain this PM with Nustep strength training compared to AM session.   Pt returned to room and performed stand pivot transfer to bed with supervision assist. Sit>supine completed with min assist at the [name], and left supine in bed with call bell in reach and all needs met.           Therapy Documentation Precautions:  Precautions Precautions: Fall Restrictions Weight Bearing Restrictions: No Pain: denies    See Function Navigator for Current Functional Status.   Therapy/Group: Individual Therapy  AustiLorie Phenix2018, 3:21 PM

## 2017-08-07 ENCOUNTER — Inpatient Hospital Stay (HOSPITAL_COMMUNITY): Payer: Medicare Other

## 2017-08-07 LAB — PROTIME-INR
INR: 3.28
PROTHROMBIN TIME: 33.1 s — AB (ref 11.4–15.2)

## 2017-08-07 LAB — GLUCOSE, CAPILLARY
GLUCOSE-CAPILLARY: 148 mg/dL — AB (ref 65–99)
Glucose-Capillary: 157 mg/dL — ABNORMAL HIGH (ref 65–99)
Glucose-Capillary: 159 mg/dL — ABNORMAL HIGH (ref 65–99)
Glucose-Capillary: 176 mg/dL — ABNORMAL HIGH (ref 65–99)

## 2017-08-07 MED ORDER — INSULIN GLARGINE 100 UNIT/ML ~~LOC~~ SOLN
8.0000 [IU] | Freq: Every day | SUBCUTANEOUS | Status: DC
  Administered 2017-08-07 – 2017-08-19 (×10): 8 [IU] via SUBCUTANEOUS
  Filled 2017-08-07 (×13): qty 0.08

## 2017-08-07 MED ORDER — WARFARIN 0.5 MG HALF TABLET
0.5000 mg | ORAL_TABLET | Freq: Once | ORAL | Status: AC
  Administered 2017-08-07: 0.5 mg via ORAL
  Filled 2017-08-07: qty 1

## 2017-08-07 NOTE — Progress Notes (Signed)
Patient ID: [name], [name]   DOB: [birth year], 61 y.o.   MRN: [id number]  [name] Progress Note   Assessment/ Plan:   1. Staphylococcus bacteremia with septic/cardiogenic shock: Remains afebrile on Ancef. Repeat blood cultures from 07/11/17 are negative. Duration of antibiotic therapy outlined by ID team.  2. ESRD: Continue hemodialysis on a Monday/Wednesday/Friday schedule via right brachiocephalic fistula-his next hemodialysis treatment has been scheduled for tomorrow later in the day so as not to interfere with ongoing PT/OT. Process underway for outpatient dialysis unit placement to [facility] (he was previously a patient there). 3. Anemia: no overt loss, continue Aranesp and intravenous iron with hemodialysis 4. CKD-MBD:hyperphosphatemia - on Velphoro/renal diet will monitor trend. PTH low and consistent with ABD. 5. Nutrition:with evidence of protein-calorie malnutrition likely with poor wound healing. Continue ONS  6. Deconditioning: now ongoing rehabilitation in CIR.  Subjective:    Reports a comfortable night and states that he is somewhat fatigued this morning   Objective:   BP (!) 110/55 (BP Location: Left Arm)   Pulse 72   Temp 97.6 F (36.4 C) (Oral)   Resp 18   Ht 5\' 10"  (1.778 m)   Wt 80 kg (176 lb 5.9 oz)   SpO2 100%   BMI 25.31 kg/m   Physical Exam: Gen: Comfortably resting in bed. CVS: Pulse regular rhythm, normal rate, 2/6 HSM over apex Resp: Anteriorly clear to auscultation, no rhonchi, no rales [id number], flat, non-tender, bowel sounds normal Ext: Trace LE edema with both lower legs in gauze dressing. Right brachiocephalic fistula with good thrill.  Labs: BMET  Recent Labs Lab 08/01/17 0724 08/02/17 0500 08/02/17 1843 08/03/17 0843 08/03/17 2002 08/05/17 1645  NA 131* 132* 132* 129* 129* 133*  K 3.3* 3.6 3.7 3.3* 3.6 3.2*  CL 94* 95* 94* 92* 92* 98*  CO2 23 26 27 26 26 30   GLUCOSE 137* 100* 157* 123* 97 77  BUN 59* 44*  45* 48* 54* 28*  CREATININE 4.09* 3.45* 3.57* 3.95* 4.26* 2.53*  CALCIUM 8.6* 8.4* 8.7* 8.6* 8.5* 8.2*  PHOS 5.9* 5.0* 5.0* 5.0* 5.5* 2.4*   CBC  Recent Labs Lab 08/02/17 1840 08/03/17 0843 08/03/17 2002 08/05/17 1630  WBC 9.9 8.4 8.6 9.4  HGB 9.2* 9.8* 9.6* 9.3*  HCT 29.7* 31.7* 29.8* 30.5*  MCV 95.5 95.5 94.3 96.8  PLT 143* 150 151 92*   Medications:    . colchicine  0.3 mg Oral Daily  . collagenase  1 application Topical Daily  . darbepoetin (ARANESP) injection - NON-DIALYSIS  200 mcg Subcutaneous Q Wed-1800  . feeding supplement (ENSURE ENLIVE)  237 mL Oral BID BM  . feeding supplement (PRO-STAT SUGAR FREE 64)  30 mL Oral BID  . hydrocerin   Topical BID  . insulin aspart  0-5 Units Subcutaneous QHS  . insulin aspart  3 Units Subcutaneous TID WC  . insulin glargine  8 Units Subcutaneous QHS  . midodrine  10 mg Oral TID WC  . multivitamin  1 tablet Oral QHS  . sucroferric oxyhydroxide  500 mg Oral TID WC  . warfarin  0.5 mg Oral ONCE-1800  . Warfarin - Pharmacist Dosing Inpatient   Does not apply [id number]   Elmarie Shiley, MD 08/07/2017, 11:10 AM

## 2017-08-07 NOTE — Progress Notes (Signed)
ANTICOAGULATION CONSULT NOTE - Follow Up Consult  Pharmacy Consult for Warfarin Indication: atrial fibrillation   No Known Allergies  Patient Measurements: Height: 5\' 10"  (177.8 cm) Weight: 176 lb 5.9 oz (80 kg) IBW/kg (Calculated) : 73  Vital Signs: Temp: 97.6 F (36.4 C) (09/02 0900) Temp Source: Oral (09/02 0900) BP: 110/55 (09/02 0900) Pulse Rate: 72 (09/02 0900)  Labs:  Recent Labs  08/05/17 0358 08/05/17 1630 08/05/17 1645 08/06/17 0427 08/07/17 0340  HGB  --  9.3*  --   --   --   HCT  --  30.5*  --   --   --   PLT  --  92*  --   --   --   LABPROT 28.1*  --   --  29.0* 33.1*  INR 2.66  --   --  2.77 3.28  CREATININE  --   --  2.53*  --   --     Estimated Creatinine Clearance: 31.3 mL/min (A) (by C-G formula based on SCr of 2.53 mg/dL (H)).  Assessment: [name] continues on coumadin for afib. Mr. Malecha seems to be very sensitive to warfarin during this admission, so use caution while dosing. His INR has trended back up and is supratherapeutic at 3.28 today. I believe the elevated INR is secondary to the 2 mg dose on the 31st.   Due to his sensitivity to warfarin, I am going to re-dose today, but at a lower dose to prevent a sharp drop in INR by holding the medication. I expect the INR to trend back down. No bleeding noted.  PTA dose: 2mg  daily except 1mg  Tue/Thur (May require a different regimen)  Goal of Therapy:  INR 2-3 Monitor platelets by anticoagulation protocol: Yes   Plan:  1) Warfarin 0.5 mg tonight 2) Daily INR, monitor s/sx bleeding  Patterson Hammersmith PharmD PGY1 Pharmacy Practice Resident 08/07/2017 10:07 AM Pager: [phone]

## 2017-08-07 NOTE — Progress Notes (Addendum)
ANTIBIOTIC CONSULT NOTE - Follow Up Consult  Pharmacy Consult for Cefazolin Indication: MSSA bacteremia  No Known Allergies  Patient Measurements: Height: 5\' 10"  (177.8 cm) Weight: 176 lb 5.9 oz (80 kg) IBW/kg (Calculated) : 73  Vital Signs: Temp: 97.6 F (36.4 C) (09/02 0900) Temp Source: Oral (09/02 0900) BP: 110/55 (09/02 0900) Pulse Rate: 72 (09/02 0900)  Labs:  Recent Labs  08/05/17 0358 08/05/17 1630 08/05/17 1645 08/06/17 0427 08/07/17 0340  HGB  --  9.3*  --   --   --   HCT  --  30.5*  --   --   --   PLT  --  92*  --   --   --   LABPROT 28.1*  --   --  29.0* 33.1*  INR 2.66  --   --  2.77 3.28  CREATININE  --   --  2.53*  --   --     Estimated Creatinine Clearance: 31.3 mL/min (A) (by C-G formula based on SCr of 2.53 mg/dL (H)).   Assessment: [name] continues on cefazolin for MSSA bacteremia. TEE 8/14 without evidence of endocarditis or vegetation on valves/ICD. ICD unable to be removed. Planning for 6 weeks of total cefazolin then suppressive cephalexin.   He was previously on CRRT and has now transitioned to intermittent dialysis with a MWF schedule. Tolerated HD on 8/25-8/29 but did have some hypotension on 8/31.  Vanc 8/4 x1 Zosyn 8/4>> 8/5 Cefazolin 8/5 >>(9/16) Rifampin 8/5 >>8/8 (d/c'd d/t hyperbilirubinemia)   8/4 bld x2: MSSA in 2/2 8/4 urine: NG 8/5 MRSA PCR: neg 8/6 bld x2>ng  Plan:  Continue cefazolin 2g IV q24   Patterson Hammersmith PharmD PGY1 Pharmacy Practice Resident 08/07/2017 10:18 AM Pager: [phone]

## 2017-08-07 NOTE — Progress Notes (Signed)
[facility] PHYSICAL MEDICINE & REHABILITATION     PROGRESS NOTE  Subjective/Complaints:  Patient seen lying in bed this morning. He states he had a long day therapies yesterday but was able to complete them and slept very well overnight afterward.  ROS: Denies CP, SOB, N/V/D.  Objective: Vital Signs: Blood pressure (!) 111/59, pulse 74, temperature 97.6 F (36.4 C), temperature source Oral, resp. rate 18, height 5\' 10"  (1.778 m), weight 80 kg (176 lb 5.9 oz), SpO2 100 %. No results found.  Recent Labs  08/05/17 1630  WBC 9.4  HGB 9.3*  HCT 30.5*  PLT 92*    Recent Labs  08/05/17 1645  NA 133*  K 3.2*  CL 98*  GLUCOSE 77  BUN 28*  CREATININE 2.53*  CALCIUM 8.2*   CBG (last 3)   Recent Labs  08/06/17 1658 08/06/17 2101 08/07/17 0702  GLUCAP 89 176* 148*    Wt Readings from Last 3 Encounters:  08/07/17 80 kg (176 lb 5.9 oz)  08/02/17 70.2 kg (154 lb 12.2 oz)  06/21/17 79.2 kg (174 lb 8 oz)    Physical Exam:  BP (!) 111/59 (BP Location: Left Arm)   Pulse 74   Temp 97.6 F (36.4 C) (Oral)   Resp 18   Ht 5\' 10"  (1.778 m)   Wt 80 kg (176 lb 5.9 oz)   SpO2 100%   BMI 25.31 kg/m  Constitutional: No distress. Vital signs reviewed.  HENT: Normocephalic and atraumatic.  Eyes: EOM are normal. No discharge.  Cardiovascular: Cardiac rate controlled. No JVD. Respiratory: Effort Normal and breath sounds normal.  GI: Bowel sounds are normal. He exhibits no distension.  Colostomy in place . Musc: No edema. No tenderness Neurological: Alert.  Motor: b/l UE 4/5 deltoid, biceps, triceps, wrist ext, HI (unchanged).  LLE: 4-/5 HF and 4+/5 KE and 5/5 ADF/PF.  [name]: 4-/5 HF, 4-4+/5 KE and 4+/5 ADF/PF.  Skin: Multiple wounds Psych: flat, cooperative   Assessment/Plan: 1. Functional deficits secondary to debility which require 3+ hours per day of interdisciplinary therapy in a comprehensive inpatient rehab setting. Physiatrist is providing close team supervision and  24 hour management of active medical problems listed below. Physiatrist and rehab team continue to assess barriers to discharge/monitor patient progress toward functional and medical goals.  Function:  Bathing Bathing position   Position: Wheelchair/chair at sink  Bathing parts Body parts bathed by patient: Right arm, Left arm, Chest, Abdomen, Front perineal area, Right upper leg, Left upper leg Body parts bathed by helper: Right lower leg, Left lower leg, Back  Bathing assist Assist Level:  (Mod assist)      Upper Body Dressing/Undressing Upper body dressing   What is the patient wearing?: Pull over shirt/dress     Pull over shirt/dress - Perfomed by patient: Thread/unthread right sleeve, Thread/unthread left sleeve, Put head through opening, Pull shirt over trunk          Upper body assist Assist Level: Set up   Set up : To obtain clothing/put away  Lower Body Dressing/Undressing Lower body dressing   What is the patient wearing?: Underwear, Pants, Non-skid slipper socks Underwear - Performed by patient: Thread/unthread left underwear leg, Pull underwear up/down, Thread/unthread right underwear leg Underwear - Performed by helper: Thread/unthread right underwear leg Pants- Performed by patient: Thread/unthread right pants leg, Thread/unthread left pants leg, Pull pants up/down Pants- Performed by helper: Thread/unthread right pants leg   Non-skid slipper socks- Performed by helper: Don/doff right sock, Don/doff left  sock                  Lower body assist Assist for lower body dressing:  (Mod assist)      Toileting Toileting Toileting activity did not occur: N/A   Toileting steps completed by helper: Adjust clothing prior to toileting, Performs perineal hygiene, Adjust clothing after toileting (per Elmo Putt, NT report)    Toileting assist     Transfers Chair/bed transfer   Chair/bed transfer method: Stand pivot Chair/bed transfer assist level: Supervision  or verbal cues Chair/bed transfer assistive device: Armrests, Medical sales representative     Max distance: 53ft Assist level: Supervision or verbal cues   Wheelchair   Type: Manual Max wheelchair distance: 1100ft  Assist Level: Supervision or verbal cues  Cognition Comprehension Comprehension assist level: Follows basic conversation/direction with no assist  Expression Expression assist level: Expresses basic needs/ideas: With no assist  Social Interaction Social Interaction assist level: Interacts appropriately with others - No medications needed.  Problem Solving Problem solving assist level: Solves basic 75 - 89% of the time/requires cueing 10 - 24% of the time  Memory Memory assist level: Recognizes or recalls 90% of the time/requires cueing < 10% of the time    Medical Problem List and Plan:  1. Severe debilitation/encephalopathy secondary to respiratory failure, CHF, MSSA sepsis   Cont CIR 2. DVT Prophylaxis/Anticoagulation: Chronic Coumadin therapy and monitor for rebleeding episodes   INR Supratherpeutic on 9/2 3. Pain Management: Hydrocodone as needed  4. Mood: Provide emotional support  5. Neuropsych: This patient is capable of making decisions on his own behalf.  6. Skin/Wound Care/lower extremity leg wound: Routine skin checks with skin care as directed   Appreciate WOC recs  Will plan to debride This week 7. Fluids/Electrolytes/Nutrition: Routine I&Os 8. CKD stage IV. Followed closely by renal services.   Cr 2.53 on 8/31, currently on dialysis, appreciate nephro recs  Labs ordered for tomorrow  Cont to monitor 9. MSSA bacteremia. Intravenous cefazolin through 08/21/2017 then chronic Keflex and follow-up per infectious disease  10. 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 Routine colostomy care  11. CAD with implantable defibrillator. Follow-up cardiology services. No plan to extract device  12. Chronic systolic congestive heart  failure/ischemic cardiomyopathy. Monitor for any signs of fluid overload.  Filed Weights   08/05/17 2018 08/06/17 0525 08/07/17 0517  Weight: 80.5 kg (177 lb 7.5 oz) 78 kg (171 lb 15.3 oz) 80 kg (176 lb 5.9 oz)  13. Diabetes mellitus and peripheral neuropathy.   Lantus insulin 10 decreased to 6U due to hypoglycemia on 9/1, Increased to 8 units on 9/2 14. Orthostasis. Continue ProAmatine 10 mg 3 times a day.   Monitor orthostatic with activity.  15. Acute on chronic anemia. Continue Aranesp.   Hb 9.3 on 8/31  Labs ordered for tomorrow 16. History of gout. Colchicine 0.3 mg daily. Monitor for any gout flareups  17. Malnourishment  Supplement initiated 8/29  LOS (Days) 5 A FACE TO FACE EVALUATION WAS PERFORMED  Johnni Wunschel Lorie Phenix 08/07/2017 8:18 AM

## 2017-08-08 ENCOUNTER — Inpatient Hospital Stay (HOSPITAL_COMMUNITY): Payer: Medicare Other | Admitting: Physical Therapy

## 2017-08-08 ENCOUNTER — Inpatient Hospital Stay (HOSPITAL_COMMUNITY): Payer: Medicare Other | Admitting: Occupational Therapy

## 2017-08-08 LAB — GLUCOSE, CAPILLARY
GLUCOSE-CAPILLARY: 160 mg/dL — AB (ref 65–99)
GLUCOSE-CAPILLARY: 85 mg/dL (ref 65–99)
Glucose-Capillary: 146 mg/dL — ABNORMAL HIGH (ref 65–99)

## 2017-08-08 LAB — CBC WITH DIFFERENTIAL/PLATELET
BASOS ABS: 0 10*3/uL (ref 0.0–0.1)
BASOS PCT: 0 %
EOS ABS: 0.1 10*3/uL (ref 0.0–0.7)
Eosinophils Relative: 1 %
HEMATOCRIT: 29.8 % — AB (ref 39.0–52.0)
HEMOGLOBIN: 9.1 g/dL — AB (ref 13.0–17.0)
Lymphocytes Relative: 5 %
Lymphs Abs: 0.6 10*3/uL — ABNORMAL LOW (ref 0.7–4.0)
MCH: 29.6 pg (ref 26.0–34.0)
MCHC: 30.5 g/dL (ref 30.0–36.0)
MCV: 97.1 fL (ref 78.0–100.0)
Monocytes Absolute: 1.2 10*3/uL — ABNORMAL HIGH (ref 0.1–1.0)
Monocytes Relative: 10 %
NEUTROS ABS: 10.7 10*3/uL — AB (ref 1.7–7.7)
Neutrophils Relative %: 84 %
Platelets: 126 10*3/uL — ABNORMAL LOW (ref 150–400)
RBC: 3.07 MIL/uL — AB (ref 4.22–5.81)
RDW: 19.6 % — ABNORMAL HIGH (ref 11.5–15.5)
WBC: 12.6 10*3/uL — AB (ref 4.0–10.5)

## 2017-08-08 LAB — BASIC METABOLIC PANEL
ANION GAP: 11 (ref 5–15)
BUN: 50 mg/dL — AB (ref 6–20)
CALCIUM: 8.6 mg/dL — AB (ref 8.9–10.3)
CO2: 25 mmol/L (ref 22–32)
Chloride: 89 mmol/L — ABNORMAL LOW (ref 101–111)
Creatinine, Ser: 4.43 mg/dL — ABNORMAL HIGH (ref 0.61–1.24)
GFR calc Af Amer: 15 mL/min — ABNORMAL LOW (ref >60)
GFR, EST NON AFRICAN AMERICAN: 13 mL/min — AB (ref >60)
GLUCOSE: 173 mg/dL — AB (ref 65–99)
POTASSIUM: 4.7 mmol/L (ref 3.5–5.1)
SODIUM: 125 mmol/L — AB (ref 135–145)

## 2017-08-08 LAB — PROTIME-INR
INR: 3.84
PROTHROMBIN TIME: 37.5 s — AB (ref 11.4–15.2)

## 2017-08-08 MED ORDER — HEPARIN SODIUM (PORCINE) 1000 UNIT/ML DIALYSIS
40.0000 [IU]/kg | INTRAMUSCULAR | Status: DC | PRN
  Administered 2017-08-08: 3200 [IU] via INTRAVENOUS_CENTRAL

## 2017-08-08 MED ORDER — MIDODRINE HCL 5 MG PO TABS
ORAL_TABLET | ORAL | Status: AC
  Administered 2017-08-08: 10 mg via ORAL
  Filled 2017-08-08: qty 2

## 2017-08-08 NOTE — Progress Notes (Signed)
Physical Therapy Note  Patient Details  Name: [name] MRN: [id number] Date of Birth: 03/12/1956 Today's Date: 08/08/2017    Time: 830-940 70 minutes  1:1 Pt with no c/o pain at rest, with activity pt c/o Rt LE pain 8/10, RN made aware and meds given during session.  Sit to stand throughout session with close supervision, cues for UE placement.  Gait on carpet and tile surfaces 60', 50', 45' x 2.  Stair training to 4'' curb step 2 x 4 stairs with RW and min A.  Step up training to 6'' step to simulate home entry with pt performing 3 x 2 steps. Pt very limited by LE fatigue with stair training.  nustep per pt request for LE/UE strengthening x 5 minutes level 3 with multiple rest breaks.  Supine PROM and stretching to Rt LE to improve mobility and decrease pain with pt with good tolerance.  Pt given HEP of supine therex to perform in room.   Nel Stoneking 08/08/2017, 9:40 AM

## 2017-08-08 NOTE — Progress Notes (Signed)
Assessment/ Plan:   1. Staphylococcus bacteremia with septic/cardiogenic shock: on Ancef. PerID team.  2. ESRD: Continue hemodialysis on a Monday/Wednesday/Friday schedule via right brachiocephalic fistula. Process underway for outpatient dialysis unit placement to [facility] (he was previously a patient there). 3. Deconditioning: now ongoing rehabilitation in CIR.  Subjective: Interval History: Improving  Objective: Vital signs in last 24 hours: Temp:  [97.4 F (36.3 C)-97.6 F (36.4 C)] 97.4 F (36.3 C) (09/03 0504) Pulse Rate:  [77] 77 (09/03 0504) Resp:  [18] 18 (09/03 0504) BP: (96-116)/(58-70) 116/58 (09/03 0504) SpO2:  [98 %] 98 % (09/03 0504) Weight:  [81 kg (178 lb 9.2 oz)] 81 kg (178 lb 9.2 oz) (09/03 0504) Weight change: 1 kg (2 lb 3.3 oz)  Intake/Output from previous day: 09/02 0701 - 09/03 0700 In: 980 [P.O.:960; I.V.:20] Out: -  Intake/Output this shift: Total I/O In: 240 [P.O.:240] Out: 150 [Urine:150]  General appearance: alert and cooperative Resp: clear to auscultation bilaterally Cardio: regular rate and rhythm, S1, S2 normal, no murmur, click, rub or gallop GI: colostomy LLQ obese abd Extremities: edema 1+, AVF RUE  Lab Results:  Recent Labs  08/05/17 1630  WBC 9.4  HGB 9.3*  HCT 30.5*  PLT 92*   BMET:  Recent Labs  08/05/17 1645  NA 133*  K 3.2*  CL 98*  CO2 30  GLUCOSE 77  BUN 28*  CREATININE 2.53*  CALCIUM 8.2*   No results for input(s): PTH in the last 72 hours. Iron Studies: No results for input(s): IRON, TIBC, TRANSFERRIN, FERRITIN in the last 72 hours. Studies/Results: No results found.  Scheduled: . colchicine  0.3 mg Oral Daily  . collagenase  1 application Topical Daily  . darbepoetin (ARANESP) injection - NON-DIALYSIS  200 mcg Subcutaneous Q Wed-1800  . feeding supplement (ENSURE ENLIVE)  237 mL Oral BID BM  . feeding supplement (PRO-STAT SUGAR FREE 64)  30 mL Oral BID  . hydrocerin   Topical BID  . insulin  aspart  0-5 Units Subcutaneous QHS  . insulin aspart  3 Units Subcutaneous TID WC  . insulin glargine  8 Units Subcutaneous QHS  . midodrine  10 mg Oral TID WC  . multivitamin  1 tablet Oral QHS  . sucroferric oxyhydroxide  500 mg Oral TID WC  . Warfarin - Pharmacist Dosing Inpatient   Does not apply q1800    LOS: 6 days   Jacolyn Joaquin C 08/08/2017,11:29 AM

## 2017-08-08 NOTE — Progress Notes (Signed)
Pt has declined use of CPAP QHS.  RT to monitor and assess as needed.  

## 2017-08-08 NOTE — Progress Notes (Signed)
Physical Therapy Note  Patient Details  Name: [name] MRN: [id number] Date of Birth: 09-26-1956 Today's Date: 08/08/2017    Time: 1330-1355 25 minutes  1:1 No c/o pain.  Pt c/o fatigue but willing to complete UE exercises in bed.  Pt performed with orange theraband 2 x 10.  Ankle pumps and saq both AAROM x 10.  Pt requires frequent rest breaks but is eager to gain strength and endurance.   [name] 08/08/2017, 1:56 PM

## 2017-08-08 NOTE — Progress Notes (Signed)
ANTICOAGULATION CONSULT NOTE - Follow Up Consult  Pharmacy Consult for warfarin Indication: atrial fibrillation  No Known Allergies  Patient Measurements: Height: 5\' 10"  (177.8 cm) Weight: 178 lb 9.2 oz (81 kg) IBW/kg (Calculated) : 73   Vital Signs: Temp: 97.4 F (36.3 C) (09/03 0504) Temp Source: Oral (09/03 0504) BP: 116/58 (09/03 0504) Pulse Rate: 77 (09/03 0504)  Labs:  Recent Labs  08/05/17 1630 08/05/17 1645 08/06/17 0427 08/07/17 0340 08/08/17 0500  HGB 9.3*  --   --   --   --   HCT 30.5*  --   --   --   --   PLT 92*  --   --   --   --   LABPROT  --   --  29.0* 33.1* 37.5*  INR  --   --  2.77 3.28 3.84  CREATININE  --  2.53*  --   --   --     Estimated Creatinine Clearance: 31.3 mL/min (A) (by C-G formula based on SCr of 2.53 mg/dL (H)).   Medications:  Scheduled:  . colchicine  0.3 mg Oral Daily  . collagenase  1 application Topical Daily  . darbepoetin (ARANESP) injection - NON-DIALYSIS  200 mcg Subcutaneous Q Wed-1800  . feeding supplement (ENSURE ENLIVE)  237 mL Oral BID BM  . feeding supplement (PRO-STAT SUGAR FREE 64)  30 mL Oral BID  . hydrocerin   Topical BID  . insulin aspart  0-5 Units Subcutaneous QHS  . insulin aspart  3 Units Subcutaneous TID WC  . insulin glargine  8 Units Subcutaneous QHS  . midodrine  10 mg Oral TID WC  . multivitamin  1 tablet Oral QHS  . sucroferric oxyhydroxide  500 mg Oral TID WC  . Warfarin - Pharmacist Dosing Inpatient   Does not apply q1800    Assessment: 61 y/o male on chronic warfarin for Afib, appears very sensitive to dose adjustments. INR is trending up (3.84 from 3.28) despite very low doses of warfarin over last 4 days.  CBC stable, no bleeding noted.  Patient-reported PTA dose: 2 mg daily except 1 mg T/Th  Prescribed PTA dose (7/25): 1 mg daily except 2 mg T/Th/Sat  Goal of Therapy:  INR 2-3 Monitor platelets by anticoagulation protocol: Yes   Plan:  Hold warfarin tonight, if INR trends down  tomorrow may consider very low dose (0.5-1mg ) tomorrow Daily INR, monitor CBC and s/sx of bleeding   Charlene Brooke, PharmD PGY1 [facility] [place] Resident Phone [phone] After 4:30 PM please call [place] [phone] 08/08/2017,9:12 AM

## 2017-08-08 NOTE — Progress Notes (Signed)
[facility] PHYSICAL MEDICINE & REHABILITATION     PROGRESS NOTE  Subjective/Complaints:  Pt seen sitting up at EOB this AM, eating breakfast.  He slept well overnight.  ROS: Denies CP, SOB, N/V/D.  Objective: Vital Signs: Blood pressure (!) 116/58, pulse 77, temperature (!) 97.4 F (36.3 C), temperature source Oral, resp. rate 18, height 5\' 10"  (1.778 m), weight 81 kg (178 lb 9.2 oz), SpO2 98 %. No results found.  Recent Labs  08/05/17 1630  WBC 9.4  HGB 9.3*  HCT 30.5*  PLT 92*    Recent Labs  08/05/17 1645  NA 133*  K 3.2*  CL 98*  GLUCOSE 77  BUN 28*  CREATININE 2.53*  CALCIUM 8.2*   CBG (last 3)   Recent Labs  08/07/17 1632 08/07/17 2126 08/08/17 0628  GLUCAP 159* 176* 146*    Wt Readings from Last 3 Encounters:  08/08/17 81 kg (178 lb 9.2 oz)  08/02/17 70.2 kg (154 lb 12.2 oz)  06/21/17 79.2 kg (174 lb 8 oz)    Physical Exam:  BP (!) 116/58 (BP Location: Left Arm)   Pulse 77   Temp (!) 97.4 F (36.3 C) (Oral)   Resp 18   Ht 5\' 10"  (1.778 m)   Wt 81 kg (178 lb 9.2 oz)   SpO2 98%   BMI 25.62 kg/m  Constitutional: No distress. Vital signs reviewed.  HENT: Normocephalic and atraumatic.  Eyes: EOM are normal. No discharge.  Cardiovascular: Cardiac rate controlled. No JVD. Respiratory: Effort normal and breath sounds normal.  GI: Bowel sounds are normal. He exhibits no distension.  Colostomy in place . Musc: No edema. No tenderness Neurological: Alert.  Motor: b/l UE 4/5 deltoid, biceps, triceps, wrist ext, HI (stable).  LLE: 4-/5 HF and 4+/5 KE and 5/5 ADF/PF.  [name]: 4-/5 HF, 4-4+/5 KE and 4+/5 ADF/PF.  Skin: Multiple wounds Psych: flat, cooperative   Assessment/Plan: 1. Functional deficits secondary to debility which require 3+ hours per day of interdisciplinary therapy in a comprehensive inpatient rehab setting. Physiatrist is providing close team supervision and 24 hour management of active medical problems listed below. Physiatrist and  rehab team continue to assess barriers to discharge/monitor patient progress toward functional and medical goals.  Function:  Bathing Bathing position   Position: Wheelchair/chair at sink  Bathing parts Body parts bathed by patient: Right arm, Left arm, Chest, Abdomen, Front perineal area, Right upper leg, Left upper leg Body parts bathed by helper: Right lower leg, Left lower leg, Back  Bathing assist Assist Level:  (Mod assist)      Upper Body Dressing/Undressing Upper body dressing   What is the patient wearing?: Pull over shirt/dress     Pull over shirt/dress - Perfomed by patient: Thread/unthread right sleeve, Thread/unthread left sleeve, Put head through opening, Pull shirt over trunk          Upper body assist Assist Level: Set up   Set up : To obtain clothing/put away  Lower Body Dressing/Undressing Lower body dressing   What is the patient wearing?: Underwear, Pants, Non-skid slipper socks Underwear - Performed by patient: Thread/unthread left underwear leg, Pull underwear up/down, Thread/unthread right underwear leg Underwear - Performed by helper: Thread/unthread right underwear leg Pants- Performed by patient: Thread/unthread right pants leg, Thread/unthread left pants leg, Pull pants up/down Pants- Performed by helper: Thread/unthread right pants leg   Non-skid slipper socks- Performed by helper: Don/doff right sock, Don/doff left sock  Lower body assist Assist for lower body dressing:  (Mod assist)      Toileting Toileting Toileting activity did not occur: No continent bowel/bladder event   Toileting steps completed by helper: Adjust clothing prior to toileting, Performs perineal hygiene, Adjust clothing after toileting (per Elmo Putt, NT report)    Toileting assist     Transfers Chair/bed transfer   Chair/bed transfer method: Stand pivot Chair/bed transfer assist level: Supervision or verbal cues Chair/bed transfer assistive  device: Armrests, Medical sales representative     Max distance: 38ft Assist level: Supervision or verbal cues   Wheelchair   Type: Manual Max wheelchair distance: 158ft  Assist Level: Supervision or verbal cues  Cognition Comprehension Comprehension assist level: Follows complex conversation/direction with extra time/assistive device, Follows basic conversation/direction with no assist  Expression Expression assist level: Expresses basic needs/ideas: With no assist  Social Interaction Social Interaction assist level: Interacts appropriately with others - No medications needed.  Problem Solving Problem solving assist level: Solves basic 75 - 89% of the time/requires cueing 10 - 24% of the time  Memory Memory assist level: Recognizes or recalls 90% of the time/requires cueing < 10% of the time    Medical Problem List and Plan:  1. Severe debilitation/encephalopathy secondary to respiratory failure, CHF, MSSA sepsis   Cont CIR 2. DVT Prophylaxis/Anticoagulation: Chronic Coumadin therapy and monitor for rebleeding episodes   INR Supratherpeutic on 9/3 3. Pain Management: Hydrocodone as needed  4. Mood: Provide emotional support  5. Neuropsych: This patient is capable of making decisions on his own behalf.  6. Skin/Wound Care/lower extremity leg wound: Routine skin checks with skin care as directed   Appreciate WOC recs  Will plan to debride tomorrow 7. Fluids/Electrolytes/Nutrition: Routine I&Os 8. CKD stage IV. Followed closely by renal services.   Cr 2.53 on 8/31, currently on dialysis, appreciate nephro recs  Labs pending  Cont to monitor 9. MSSA bacteremia. Intravenous cefazolin through 08/21/2017 then chronic Keflex and follow-up per infectious disease  10. 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 Routine colostomy care  11. CAD with implantable defibrillator. Follow-up cardiology services. No plan to extract device  12. Chronic systolic congestive  heart failure/ischemic cardiomyopathy. Monitor for any signs of fluid overload.  Filed Weights   08/06/17 0525 08/07/17 0517 08/08/17 0504  Weight: 78 kg (171 lb 15.3 oz) 80 kg (176 lb 5.9 oz) 81 kg (178 lb 9.2 oz)  13. Diabetes mellitus and peripheral neuropathy.   Lantus insulin 10 decreased to 6U due to hypoglycemia on 9/1, Increased to 8 units on 9/2  Cont to monitor 14. Orthostasis. Continue ProAmatine 10 mg 3 times a day.  Controlled 15. Acute on chronic anemia. Continue Aranesp.   Hb 9.3 on 8/31  Labs pending 16. History of gout. Colchicine 0.3 mg daily. Monitor for any gout flareups  17. Malnourishment  Supplement initiated 8/29  LOS (Days) 6 A FACE TO FACE EVALUATION WAS PERFORMED  Ankit Lorie Phenix 08/08/2017 9:18 AM

## 2017-08-08 NOTE — Progress Notes (Signed)
Occupational Therapy Session Note  Patient Details  Name: [name] MRN: [id number] Date of Birth: 1956/09/22  Today's Date: 08/08/2017 OT Individual Time: 6553-7482 and 1430-1501 OT Individual Time Calculation (min): 54 min and 31 min   Short Term Goals: Week 1:  OT Short Term Goal 1 (Week 1): Pt will complete bathing with min assist at sit > stand level OT Short Term Goal 2 (Week 1): Pt will complete LB dressing with mod assist at sit > stand level OT Short Term Goal 3 (Week 1): Pt will complete toilet transfer with min assist  OT Short Term Goal 4 (Week 1): Pt will complete 2 grooming tasks in standing with min assist for increased standing tolerance  Skilled Therapeutic Interventions/Progress Updates:    1) Treatment session with focus on activity tolerance and d/c planning.  Pt received supine in bed reporting "worn out" from PT session.  Engaged in discussion regarding bathroom setup and recommendation for shower seat and BSC as pt requiring elevated surfaces to increase independence with sit > stand.  Pt ambulated 14' x2 with RW with min guard.  Returned to bed at end of session, pt falling asleep before therapist exiting.  2) Treatment session with focus on overall activity tolerance and increased endurance.  Completed 2 sets of 10 chest presses and overhead presses with 2# dowel rod seated EOB.  Completed abduction/internal rotation with dowel rod and rowing motion for increased strengthening and endurance.  Pt returned to bed with supervision, increased ability to lift [name], and left semi-reclined with wife present.  Therapy Documentation Precautions:  Precautions Precautions: Fall Restrictions Weight Bearing Restrictions: No Pain:  Pt with no c/o pain  See Function Navigator for Current Functional Status.   Therapy/Group: Individual Therapy  Simonne Come 08/08/2017, 12:23 PM

## 2017-08-09 ENCOUNTER — Inpatient Hospital Stay (HOSPITAL_COMMUNITY): Payer: Medicare Other | Admitting: Physical Therapy

## 2017-08-09 ENCOUNTER — Inpatient Hospital Stay (HOSPITAL_COMMUNITY): Payer: Medicare Other | Admitting: Occupational Therapy

## 2017-08-09 ENCOUNTER — Inpatient Hospital Stay (HOSPITAL_COMMUNITY): Payer: Medicare Other

## 2017-08-09 DIAGNOSIS — N186 End stage renal disease: Secondary | ICD-10-CM

## 2017-08-09 LAB — GLUCOSE, CAPILLARY
Glucose-Capillary: 136 mg/dL — ABNORMAL HIGH (ref 65–99)
Glucose-Capillary: 159 mg/dL — ABNORMAL HIGH (ref 65–99)
Glucose-Capillary: 128 mg/dL — ABNORMAL HIGH (ref 65–99)
Glucose-Capillary: 151 mg/dL — ABNORMAL HIGH (ref 65–99)

## 2017-08-09 LAB — CBC
HEMATOCRIT: 28.8 % — AB (ref 39.0–52.0)
HEMOGLOBIN: 8.8 g/dL — AB (ref 13.0–17.0)
MCH: 29.8 pg (ref 26.0–34.0)
MCHC: 30.6 g/dL (ref 30.0–36.0)
MCV: 97.6 fL (ref 78.0–100.0)
Platelets: 103 10*3/uL — ABNORMAL LOW (ref 150–400)
RBC: 2.95 MIL/uL — ABNORMAL LOW (ref 4.22–5.81)
RDW: 19.9 % — ABNORMAL HIGH (ref 11.5–15.5)
WBC: 9.6 10*3/uL (ref 4.0–10.5)

## 2017-08-09 LAB — PROTIME-INR
INR: 3.57
Prothrombin Time: 35.4 seconds — ABNORMAL HIGH (ref 11.4–15.2)

## 2017-08-09 NOTE — Progress Notes (Signed)
ANTICOAGULATION CONSULT NOTE - Follow Up Consult  Pharmacy Consult for warfarin Indication: atrial fibrillation  No Known Allergies  Patient Measurements: Height: 5\' 10"  (177.8 cm) Weight: 186 lb (84.4 kg) IBW/kg (Calculated) : 73   Vital Signs: Temp: 98.6 F (37 C) (09/04 0550) Temp Source: Oral (09/04 0550) BP: 98/54 (09/04 0550) Pulse Rate: 72 (09/04 0550)  Labs:  Recent Labs  08/07/17 0340 08/08/17 0500 08/08/17 1312 08/09/17 0434  HGB  --   --  9.1* 8.8*  HCT  --   --  29.8* 28.8*  PLT  --   --  126* 103*  LABPROT 33.1* 37.5*  --  35.4*  INR 3.28 3.84  --  3.57  CREATININE  --   --  4.43*  --     Estimated Creatinine Clearance: 17.9 mL/min (A) (by C-G formula based on SCr of 4.43 mg/dL (H)).     Assessment: 61 y/o male on chronic warfarin for Afib, appears very sensitive to dose adjustments. INR trending down but still remains above goal 3.57 after holding yesterday's dose. Hgb/platelets low but stable. No bleeding.  Patient-reported PTA dose: 2 mg daily except 1 mg T/Th  Prescribed PTA dose (7/25): 1 mg daily except 2 mg T/Th/Sat  Goal of Therapy:  INR 2-3 Monitor platelets by anticoagulation protocol: Yes   Plan:  1) Hold warfarin again tonight 2) Daily INR  Nena Jordan, PharmD, BCPS 08/09/2017,1:48 PM

## 2017-08-09 NOTE — Progress Notes (Signed)
Occupational Therapy Session Note  Patient Details  Name: [name] MRN: [id number] Date of Birth: 07-02-1956  Today's Date: 08/09/2017 OT Individual Time: 2979-8921 OT Individual Time Calculation (min): 44 min    Short Term Goals: Week 1:  OT Short Term Goal 1 (Week 1): Pt will complete bathing with min assist at sit > stand level OT Short Term Goal 2 (Week 1): Pt will complete LB dressing with mod assist at sit > stand level OT Short Term Goal 3 (Week 1): Pt will complete toilet transfer with min assist  OT Short Term Goal 4 (Week 1): Pt will complete 2 grooming tasks in standing with min assist for increased standing tolerance  Skilled Therapeutic Interventions/Progress Updates:    Pt seen to focus on dynamic standing balance and activity tolerance/endurance. Pt received in bed reporting fatigue but willing to participate in therapy session. Pt reported pain in lower back area stating "it started this morning and it's bad". Pt able to complete bed <> w/c stand pivot transfer with supervision with RW. Pt participated in therapeutic activity sitting on therapy mat and standing using RW. W/c<>therapy mat stand pivot transfer with supervision with RW. Pt was able to successfully complete activity while sitting on therapy mat with instructional cues with RUE crossing midline to simulate functional tasks. Pt able to complete activity standing at RW ~1:30 for the first attempt and ~3:00 on the second attempt with supervision. Pt required rest breaks between each attempt secondary to decreased endurance and muscle weakness. Pt left in bed with all needs in reach.  Therapy Documentation Precautions:  Precautions Precautions: Fall Restrictions Weight Bearing Restrictions: No General: General PT Missed Treatment Reason: Patient fatigue Vital Signs:  Pain: Pain Assessment Pain Score: 3     See Function Navigator for Current Functional Status.   Therapy/Group: Individual  Therapy  Hoyt Koch 08/09/2017, 2:15 PM

## 2017-08-09 NOTE — Progress Notes (Signed)
Patient refused CPAP for tonight 

## 2017-08-09 NOTE — Progress Notes (Signed)
Occupational Therapy Session Note  Patient Details  Name: [name] MRN: [id number] Date of Birth: 06-24-1956  Today's Date: 08/09/2017 OT Individual Time: 2010-0712 OT Individual Time Calculation (min): 55 min    Short Term Goals: Week 1:  OT Short Term Goal 1 (Week 1): Pt will complete bathing with min assist at sit > stand level OT Short Term Goal 2 (Week 1): Pt will complete LB dressing with mod assist at sit > stand level OT Short Term Goal 3 (Week 1): Pt will complete toilet transfer with min assist  OT Short Term Goal 4 (Week 1): Pt will complete 2 grooming tasks in standing with min assist for increased standing tolerance  Skilled Therapeutic Interventions/Progress Updates:    Treatment session with focus on functional mobility and self-care retraining.  Pt received in sidelying reporting MD having partially removed dressings during assessment this AM and requesting nursing to re-dress.  Completed bed mobility with supervision, pt ate breakfast seated EOB with setup for items while engaging in discussion regarding d/c planning and wound care.  Ambulated to sink with RW with supervision.  Pt completed bathing and dressing at sit > stand with supervision/setup, utilized reacher to don pants/underwear.  Returned to bed at end of session and RN in to provide morning meds.  Therapy Documentation Precautions:  Precautions Precautions: Fall Restrictions Weight Bearing Restrictions: No Pain: Pain Assessment Pain Assessment: 0-10 Pain Score: 4   See Function Navigator for Current Functional Status.   Therapy/Group: Individual Therapy  Simonne Come 08/09/2017, 12:06 PM

## 2017-08-09 NOTE — Progress Notes (Signed)
Physical Therapy Weekly Progress Note  Patient Details  Name: [name] MRN: [id number] Date of Birth: 08-04-1956  Beginning of progress report period: August 03, 2017 End of progress report period: August 10, 2017   Patient has met 1 of 2 short term goals.  Pt is making slow and steady progress towards goals. Pt requires frequent rests but is overall min A for mobility.  Patient continues to demonstrate the following deficits muscle weakness, decreased cardiorespiratoy endurance and decreased standing balance and decreased balance strategies and therefore will continue to benefit from skilled PT intervention to increase functional independence with mobility.  Patient progressing toward long term goals..  Continue plan of care.  PT Short Term Goals Week 1:  PT Short Term Goal 1 (Week 1): pt will perform gait with min A 50' in controlled environment PT Short Term Goal 1 - Progress (Week 1): Met PT Short Term Goal 2 (Week 1): pt will perform stair negotiation x 1 step with min A PT Short Term Goal 2 - Progress (Week 1): Progressing toward goal Week 2:  PT Short Term Goal 1 (Week 2): pt will perform gait in controlled environment supervision x 80' PT Short Term Goal 2 (Week 2): pt will perform step up x 1 to simulate home entry with min A  Skilled Therapeutic Interventions/Progress Updates:  Ambulation/gait training;Balance/vestibular training;Community reintegration;Discharge planning;DME/adaptive equipment instruction;Functional mobility training;Neuromuscular re-education;Pain management;Patient/family education;Splinting/orthotics;Stair training;Therapeutic Activities;Therapeutic Exercise;UE/LE Strength taining/ROM;UE/LE Coordination activities;Wheelchair propulsion/positioning    See Function Navigator for Current Functional Status.   Hennesy Sobalvarro 08/09/2017, 8:24 AM

## 2017-08-09 NOTE — Progress Notes (Addendum)
Physical Therapy Note  Patient Details  Name: [name] MRN: [id number] Date of Birth: 10-17-1956 Today's Date: 08/09/2017    Time: 1030-1110 40 minutes  1:1 Pt continues with c/o Rt LE pain, meds given during session.  Pt states he is fatigued from previous sessions, but is willing to participate with PT. Pt performs gait 100', 80' x 2 with supervision with RW on tile and carpeted surfaces, pt able to self correct LOB with stepping over thresholds.  Pt and wife educated on team discussion of possibly moving d/c forward.  Both seem nervous about d/c home.  Educated that date hadn't been moved forward and that this was just a recommendation.  Confirmed with social worker possibility of air mattress hospital bed at home, pt states he would like one.  Pt missed final 20 minutes of session due to fatigue.  Time 2: 1430-1500 30 minutes  1:1 No c/o pain.  Pt performed curb step negotiation to simulate home entry x 4 with close supervision/min guard with min A final attempt due to fatigue.  Stepping over obstacles with RW with supervision, cues for technique.  Pt with improved endurance and activity tolerance this session.   Ivie Savitt 08/09/2017, 11:18 AM

## 2017-08-09 NOTE — Progress Notes (Signed)
Patient ID: [name], male   DOB: 04/23/1956, 61 y.o.   MRN: [id number]     Advanced Heart Failure Rounding Note  Primary Cardiologist: [name]  Subjective:    Admitted with staph bacteremia with mixed septic/cardiogenic shock.   TEE 07/19/17 with LVEF 20%. No evidence of endocarditis or vegetation on valves/ICD CVVD stopped 07/20/17. Tolerated iHD 7/27  Transferred to CIR last week. Tolerating iHD  Feels ok. Had to stop HD early last week but got 1.8 off yesterday. Getting stronger at CIR. Able to do most ADLs. No orthopnea or PND>       Objective:   Weight Range: 186 lb (84.4 kg) Body mass index is 26.69 kg/m.   Vital Signs:   Temp:  [97.4 F (36.3 C)-98.6 F (37 C)] 98.6 F (37 C) (09/04 0550) Pulse Rate:  [69-76] 72 (09/04 0550) Resp:  [16-18] 16 (09/04 0550) BP: (84-112)/(44-62) 98/54 (09/04 0550) SpO2:  [96 %-100 %] 98 % (09/04 0550) Weight:  [185 lb 3 oz (84 kg)-187 lb 6.3 oz (85 kg)] 186 lb (84.4 kg) (09/04 0550) Last BM Date: 08/08/17  Weight change: Filed Weights   08/08/17 1550 08/08/17 2000 08/09/17 0550  Weight: 187 lb 6.3 oz (85 kg) 185 lb 3 oz (84 kg) 186 lb (84.4 kg)    Intake/Output:   Intake/Output Summary (Last 24 hours) at 08/09/17 1055 Last data filed at 08/09/17 0900  Gross per 24 hour  Intake              360 ml  Output             1700 ml  Net            -1340 ml      Physical Exam   General:  Sitting in bed. Weak appearing.  HEENT: normal Neck: supple. JVP 10 with prominent CV waved  Carotids 2+ bilat; no bruits. No lymphadenopathy or thryomegaly appreciated. Cor: PMI laterally displaced. Regular rate & rhythm. 2/6 AS Lungs: clear decreased at bases Abdomen: soft, nontender, nondistended. No hepatosplenomegaly. No bruits or masses. Good bowel sounds. + LLQ colostomy bag Extremities: no cyanosis, clubbing, rash, 2+ thigh edema  Neuro: alert & orientedx3, cranial nerves grossly intact. moves all 4 extremities w/o difficulty.  Affect pleasant  Telemetry   NA  Labs    CBC  Recent Labs  08/08/17 1312 08/09/17 0434  WBC 12.6* 9.6  NEUTROABS 10.7*  --   HGB 9.1* 8.8*  HCT 29.8* 28.8*  MCV 97.1 97.6  PLT 126* 103*   Basic Metabolic Panel  Recent Labs  08/08/17 1312  NA 125*  K 4.7  CL 89*  CO2 25  GLUCOSE 173*  BUN 50*  CREATININE 4.43*  CALCIUM 8.6*   Liver Function Tests No results for input(s): AST, ALT, ALKPHOS, BILITOT, PROT, ALBUMIN in the last 72 hours. No results for input(s): LIPASE, AMYLASE in the last 72 hours. Cardiac Enzymes No results for input(s): CKTOTAL, CKMB, CKMBINDEX, TROPONINI in the last 72 hours.  BNP: BNP (last 3 results)  Recent Labs  06/13/17 1205 07/09/17 2151  BNP 1,151.0* 1,553.9*    ProBNP (last 3 results) No results for input(s): PROBNP in the last 8760 hours.   D-Dimer No results for input(s): DDIMER in the last 72 hours. Hemoglobin A1C No results for input(s): HGBA1C in the last 72 hours. Fasting Lipid Panel No results for input(s): CHOL, HDL, LDLCALC, TRIG, CHOLHDL, LDLDIRECT in the last 72 hours. Thyroid Function Tests No results  for input(s): TSH, T4TOTAL, T3FREE, THYROIDAB in the last 72 hours.  Invalid input(s): FREET3  Other results:   Imaging   No results found.  Medications:     Scheduled Medications: . colchicine  0.3 mg Oral Daily  . collagenase  1 application Topical Daily  . darbepoetin (ARANESP) injection - NON-DIALYSIS  200 mcg Subcutaneous Q Wed-1800  . feeding supplement (ENSURE ENLIVE)  237 mL Oral BID BM  . feeding supplement (PRO-STAT SUGAR FREE 64)  30 mL Oral BID  . hydrocerin   Topical BID  . insulin aspart  0-5 Units Subcutaneous QHS  . insulin aspart  3 Units Subcutaneous TID WC  . insulin glargine  8 Units Subcutaneous QHS  . midodrine  10 mg Oral TID WC  . multivitamin  1 tablet Oral QHS  . sucroferric oxyhydroxide  500 mg Oral TID WC  . Warfarin - Pharmacist Dosing Inpatient   Does not apply q1800     Infusions: .  ceFAZolin (ANCEF) IV 2 g (08/09/17 0110)  . ferric gluconate (FERRLECIT/NULECIT) IV 125 mg (08/08/17 1848)    PRN Medications: acetaminophen, alum & mag hydroxide-simeth, HYDROcodone-acetaminophen, ondansetron **OR** ondansetron (ZOFRAN) IV, sodium chloride flush, sorbitol    Patient Profile   [name] is a 61 year old with a history of DMII, SAH 2016, rectal cancer, CKD Stage IV, chronic A fib, S/P AVN ablation, PAF, CAD BMS LAD 2011 BMS LAD 2009, OSA, chronic systolic heart failure, St Jude BiV ICD admitted with AKI and shock with elevated lactate, suspect cardiogenic.   Assessment/Plan   1. Acute on chronic systolic CHF/cardiogenic shock: Last echo in 8/17 with EF 15-20%.  Ischemic cardiomyopathy, has St Jude BiV ICD (despite chronic afib appears to be effectively BiV pacing).  Had recent admission (discharged 06/21/17) requiring milrinone support and diuresis.  Admitted with soft BP (SBP 80s-90s) + elevated lactate.  Suspect mixed septic/cardiogenic shock with MSSA bacteremia.  - CVVHD stopped 07/20/17.  - Tolerating iHD but unable to get him completely dry to hypotension and severe HF -  No beta blocker with decompensation, hypotension. SBP remains soft.  - No ARB with AKI.    2. AKI on CKD stage 4:  -Tolerating HD M-W-F.  Outpatient HD site in [place] is being arranged.  -Continue midodrine.  -- Management per Nephrology  3. Atrial fibrillation: Chronic - Continue coumadin. No plans to extract device.  - INR 3.57 . Holding coumadin today. Discussed with PharmD  4. CAD:  - No ischemia s/s  - Continue ASA. No statin with recent elevated LFT's    5. ID:  -MSSA bacteremia  -Blood CX 2/2 MSSA. Antibiotics narrowed, now on Ancef. 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   - Not planning ICD extraction for now due to severe comorbidities. He is device dependent.  - TEE 07/19/17 with no evidence of endocarditis.  -Per ID continue cefazolin  until 9/15 the start renally-dosed cephalexin. WBC stable.  -PICC placed.    6. H/O Rectal CA with diverting colostomy   7. Type II diabetes:  - Continue sliding scale. Glucose stable.    8. Liver failure:  LFTs elevated on admit. Resolved.   9. Anemia of chronic disease. - s/p RBCs on 8/17.  Hgb 8.8 .   10. Deconditioning-- CIR appreciated.    Length of Stay: 7  Amy Clegg, NP  08/09/2017, 10:55 AM  Advanced Heart Failure Team Pager [phone] (M-F; 7a - 4p)  Please contact [facility] for night-coverage after  hours (4p -7a ) and weekends on [email or web address]  Patient seen and examined with Tonye Becket, NP. We discussed all aspects of the encounter. I agree with the assessment and plan as stated above.   Doing fairly well. Getting stronger with CIR. Making very little urine. On midodrine, tolerating iHD but unable to get him completely dry due to hypotension and severe HF. Will do the best we can.   Arvilla Meres, MD  12:11 PM

## 2017-08-09 NOTE — Progress Notes (Signed)
Assessment/ Plan:   1. Staphylococcus bacteremia with septic/cardiogenic shock: on Ancef. PerID team.  2. ESRD: Continue hemodialysis on a Monday/Wednesday/Friday schedule via right brachiocephalic fistula. Process underway for outpatient dialysis unit placement to [facility] (he was previously a patient there). 3. Deconditioning: now ongoing rehabilitation in CIR.  Subjective: Interval History:   Objective: Vital signs in last 24 hours: Temp:  [98.4 F (36.9 C)-98.6 F (37 C)] 98.4 F (36.9 C) (09/04 1613) Pulse Rate:  [70-72] 70 (09/04 1613) Resp:  [16-17] 17 (09/04 1613) BP: (92-98)/(50-54) 92/50 (09/04 1613) SpO2:  [97 %-98 %] 97 % (09/04 1613) Weight:  [84.4 kg (186 lb)] 84.4 kg (186 lb) (09/04 0550) Weight change: 4 kg (8 lb 13.1 oz)  Intake/Output from previous day: 09/03 0701 - 09/04 0700 In: 480 [P.O.:480] Out: 1850 [Urine:150] Intake/Output this shift: Total I/O In: -  Out: 200 [Urine:200]  General appearance: alert and cooperative  Lab Results:  Recent Labs  08/08/17 1312 08/09/17 0434  WBC 12.6* 9.6  HGB 9.1* 8.8*  HCT 29.8* 28.8*  PLT 126* 103*   BMET:  Recent Labs  08/08/17 1312  NA 125*  K 4.7  CL 89*  CO2 25  GLUCOSE 173*  BUN 50*  CREATININE 4.43*  CALCIUM 8.6*   No results for input(s): PTH in the last 72 hours. Iron Studies: No results for input(s): IRON, TIBC, TRANSFERRIN, FERRITIN in the last 72 hours. Studies/Results: No results found.  Scheduled: . colchicine  0.3 mg Oral Daily  . collagenase  1 application Topical Daily  . darbepoetin (ARANESP) injection - NON-DIALYSIS  200 mcg Subcutaneous Q Wed-1800  . feeding supplement (ENSURE ENLIVE)  237 mL Oral BID BM  . feeding supplement (PRO-STAT SUGAR FREE 64)  30 mL Oral BID  . hydrocerin   Topical BID  . insulin aspart  0-5 Units Subcutaneous QHS  . insulin aspart  3 Units Subcutaneous TID WC  . insulin glargine  8 Units Subcutaneous QHS  . midodrine  10 mg Oral TID WC   . multivitamin  1 tablet Oral QHS  . sucroferric oxyhydroxide  500 mg Oral TID WC  . Warfarin - Pharmacist Dosing Inpatient   Does not apply q1800      LOS: 7 days   Mahdiya Mossberg C 08/09/2017,9:47 PM

## 2017-08-09 NOTE — Progress Notes (Signed)
[place] PHYSICAL MEDICINE & REHABILITATION     PROGRESS NOTE  Subjective/Complaints:  Pt seen laying in bed this AM.  He slept well overnight.    ROS: Denies CP, SOB, N/V/D.  Objective: Vital Signs: Blood pressure (!) 98/54, pulse 72, temperature 98.6 F (37 C), temperature source Oral, resp. rate 16, height 5\' 10"  (1.778 m), weight 84.4 kg (186 lb), SpO2 98 %. No results found.  Recent Labs  08/08/17 1312 08/09/17 0434  WBC 12.6* 9.6  HGB 9.1* 8.8*  HCT 29.8* 28.8*  PLT 126* 103*    Recent Labs  08/08/17 1312  NA 125*  K 4.7  CL 89*  GLUCOSE 173*  BUN 50*  CREATININE 4.43*  CALCIUM 8.6*   CBG (last 3)   Recent Labs  08/08/17 1150 08/08/17 2044 08/09/17 0654  GLUCAP 160* 85 136*    Wt Readings from Last 3 Encounters:  08/09/17 84.4 kg (186 lb)  08/02/17 70.2 kg (154 lb 12.2 oz)  06/21/17 79.2 kg (174 lb 8 oz)    Physical Exam:  BP (!) 98/54 (BP Location: Left Arm)   Pulse 72   Temp 98.6 F (37 C) (Oral)   Resp 16   Ht 5\' 10"  (1.778 m)   Wt 84.4 kg (186 lb)   SpO2 98%   BMI 26.69 kg/m  Constitutional: No distress. Vital signs reviewed.  HENT: Normocephalic and atraumatic.  Eyes: EOM are normal. No discharge.  Cardiovascular: Cardiac rate controlled. No JVD. Respiratory: Effort normal and breath sounds normal.  GI: Bowel sounds are normal. He exhibits no distension.  Colostomy in place . Musc: No edema. No tenderness Neurological: Alert.  Motor: b/l UE 4/5 deltoid, biceps, triceps, wrist ext, HI (stable).  LLE: 4-/5 HF and 4+/5 KE and 5/5 ADF/PF.  [name]: 3-/5 HF, 4/5 KE and 4+/5 ADF/PF.  Skin: Multiple wounds. Sacral ulcer unstageable.  Psych: flat, cooperative   Assessment/Plan: 1. Functional deficits secondary to debility which require 3+ hours per day of interdisciplinary therapy in a comprehensive inpatient rehab setting. Physiatrist is providing close team supervision and 24 hour management of active medical problems listed  below. Physiatrist and rehab team continue to assess barriers to discharge/monitor patient progress toward functional and medical goals.  Function:  Bathing Bathing position   Position: Wheelchair/chair at sink  Bathing parts Body parts bathed by patient: Right arm, Left arm, Chest, Abdomen, Front perineal area, Right upper leg, Left upper leg Body parts bathed by helper: Right lower leg, Left lower leg, Back  Bathing assist Assist Level:  (Mod assist)      Upper Body Dressing/Undressing Upper body dressing   What is the patient wearing?: Pull over shirt/dress     Pull over shirt/dress - Perfomed by patient: Thread/unthread right sleeve, Thread/unthread left sleeve, Put head through opening, Pull shirt over trunk          Upper body assist Assist Level: Set up   Set up : To obtain clothing/put away  Lower Body Dressing/Undressing Lower body dressing   What is the patient wearing?: Underwear, Pants, Non-skid slipper socks Underwear - Performed by patient: Thread/unthread left underwear leg, Pull underwear up/down, Thread/unthread right underwear leg Underwear - Performed by helper: Thread/unthread right underwear leg Pants- Performed by patient: Thread/unthread right pants leg, Thread/unthread left pants leg, Pull pants up/down Pants- Performed by helper: Thread/unthread right pants leg   Non-skid slipper socks- Performed by helper: Don/doff right sock, Don/doff left sock  Lower body assist Assist for lower body dressing:  (Mod assist)      Toileting Toileting Toileting activity did not occur: No continent bowel/bladder event   Toileting steps completed by helper: Adjust clothing prior to toileting, Performs perineal hygiene, Adjust clothing after toileting (per Elmo Putt, NT report)    Toileting assist     Transfers Chair/bed transfer   Chair/bed transfer method: Stand pivot Chair/bed transfer assist level: Supervision or verbal  cues Chair/bed transfer assistive device: Armrests, Medical sales representative     Max distance: 59ft Assist level: Supervision or verbal cues   Wheelchair   Type: Manual Max wheelchair distance: 173ft  Assist Level: Supervision or verbal cues  Cognition Comprehension Comprehension assist level: Follows complex conversation/direction with extra time/assistive device, Follows basic conversation/direction with no assist  Expression Expression assist level: Expresses basic needs/ideas: With no assist  Social Interaction Social Interaction assist level: Interacts appropriately with others - No medications needed.  Problem Solving Problem solving assist level: Solves basic 75 - 89% of the time/requires cueing 10 - 24% of the time  Memory Memory assist level: Recognizes or recalls 90% of the time/requires cueing < 10% of the time    Medical Problem List and Plan:  1. Severe debilitation/encephalopathy secondary to respiratory failure, CHF, MSSA sepsis   Cont CIR 2. DVT Prophylaxis/Anticoagulation: Chronic Coumadin therapy and monitor for rebleeding episodes   INR Supratherpeutic on 9/4 3. Pain Management: Hydrocodone as needed  4. Mood: Provide emotional support  5. Neuropsych: This patient is capable of making decisions on his own behalf.  6. Skin/Wound Care/lower extremity leg wound: Routine skin checks with skin care as directed   Appreciate WOC recs  Will debride prior to d/c 7. Fluids/Electrolytes/Nutrition: Routine I&Os 8. CKD stage IV. Followed closely by renal services.   On dialysis, appreciate nephro recs  Cont to monitor 9. MSSA bacteremia. Intravenous cefazolin through 08/21/2017 then chronic Keflex and follow-up per infectious disease  10. 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 Routine colostomy care  11. CAD with implantable defibrillator. Follow-up cardiology services. No plan to extract device  12. Chronic systolic congestive heart  failure/ischemic cardiomyopathy. Monitor for any signs of fluid overload.  Filed Weights   08/08/17 1550 08/08/17 2000 08/09/17 0550  Weight: 85 kg (187 lb 6.3 oz) 84 kg (185 lb 3 oz) 84.4 kg (186 lb)  13. Diabetes mellitus and peripheral neuropathy.   Lantus insulin 10 decreased to 6U due to hypoglycemia on 9/1, Increased to 8 units on 9/2  Cont to monitor 14. Orthostasis. Continue ProAmatine 10 mg 3 times a day.  Controlled 15. Acute on chronic anemia. Continue Aranesp.   Hb 8.8 on 9/4 16. History of gout. Colchicine 0.3 mg daily. Monitor for any gout flareups  17. Malnourishment  Supplement initiated 8/29 18. Thrombocytopenia  Plts 103 on 9/4  Cont to monitor  LOS (Days) 7 A FACE TO FACE EVALUATION WAS PERFORMED  Ankit Lorie Phenix 08/09/2017 8:53 AM

## 2017-08-09 NOTE — Progress Notes (Signed)
Occupational Therapy Note  Patient Details  Name: [name] MRN: [id number] Date of Birth: 12/18/1955  Today's Date: 08/09/2017 OT Individual Time: 1000-1030 OT Individual Time Calculation (min): 30 min   Pt denies pain Individual Therapy  Pt resting in bed upon arrival with wife present.  Pt engaged in BUE therex with 3# dumbbells-chest presses 8X3, overhead raises 8X3, bicep curls 8X3 with multiple rest break. Pt remained in bed with all needs within reach.    Leotis Shames [facility] 08/09/2017, 10:33 AM

## 2017-08-10 ENCOUNTER — Inpatient Hospital Stay (HOSPITAL_COMMUNITY): Payer: Medicare Other | Admitting: Physical Therapy

## 2017-08-10 ENCOUNTER — Inpatient Hospital Stay (HOSPITAL_COMMUNITY): Payer: Medicare Other | Admitting: Occupational Therapy

## 2017-08-10 ENCOUNTER — Inpatient Hospital Stay (HOSPITAL_COMMUNITY): Payer: Medicare Other

## 2017-08-10 DIAGNOSIS — D696 Thrombocytopenia, unspecified: Secondary | ICD-10-CM

## 2017-08-10 LAB — CBC
HCT: 28.9 % — ABNORMAL LOW (ref 39.0–52.0)
HEMATOCRIT: 30 % — AB (ref 39.0–52.0)
HEMOGLOBIN: 9.1 g/dL — AB (ref 13.0–17.0)
Hemoglobin: 8.9 g/dL — ABNORMAL LOW (ref 13.0–17.0)
MCH: 29.9 pg (ref 26.0–34.0)
MCH: 29.9 pg (ref 26.0–34.0)
MCHC: 30.3 g/dL (ref 30.0–36.0)
MCHC: 30.8 g/dL (ref 30.0–36.0)
MCV: 98.7 fL (ref 78.0–100.0)
MCV: 97 fL (ref 78.0–100.0)
PLATELETS: 110 10*3/uL — AB (ref 150–400)
Platelets: 119 10*3/uL — ABNORMAL LOW (ref 150–400)
RBC: 3.04 MIL/uL — ABNORMAL LOW (ref 4.22–5.81)
RBC: 2.98 MIL/uL — ABNORMAL LOW (ref 4.22–5.81)
RDW: 19.8 % — AB (ref 11.5–15.5)
RDW: 20.1 % — AB (ref 11.5–15.5)
WBC: 10 10*3/uL (ref 4.0–10.5)
WBC: 8.5 10*3/uL (ref 4.0–10.5)

## 2017-08-10 LAB — RENAL FUNCTION PANEL
Albumin: 1.7 g/dL — ABNORMAL LOW (ref 3.5–5.0)
Albumin: 2.1 g/dL — ABNORMAL LOW (ref 3.5–5.0)
Anion gap: 12 (ref 5–15)
Anion gap: 10 (ref 5–15)
BUN: 40 mg/dL — AB (ref 6–20)
BUN: 16 mg/dL (ref 6–20)
CALCIUM: 8.6 mg/dL — AB (ref 8.9–10.3)
CALCIUM: 8.3 mg/dL — AB (ref 8.9–10.3)
CHLORIDE: 96 mmol/L — AB (ref 101–111)
CO2: 25 mmol/L (ref 22–32)
CO2: 29 mmol/L (ref 22–32)
CREATININE: 3.87 mg/dL — AB (ref 0.61–1.24)
Chloride: 91 mmol/L — ABNORMAL LOW (ref 101–111)
Creatinine, Ser: 2.11 mg/dL — ABNORMAL HIGH (ref 0.61–1.24)
GFR calc Af Amer: 37 mL/min — ABNORMAL LOW (ref >60)
GFR calc non Af Amer: 15 mL/min — ABNORMAL LOW (ref >60)
GFR calc non Af Amer: 32 mL/min — ABNORMAL LOW (ref >60)
GFR, EST AFRICAN AMERICAN: 18 mL/min — AB (ref >60)
GLUCOSE: 168 mg/dL — AB (ref 65–99)
GLUCOSE: 93 mg/dL (ref 65–99)
Phosphorus: 3.2 mg/dL (ref 2.5–4.6)
Phosphorus: 1.9 mg/dL — ABNORMAL LOW (ref 2.5–4.6)
Potassium: 4.1 mmol/L (ref 3.5–5.1)
Potassium: 3.3 mmol/L — ABNORMAL LOW (ref 3.5–5.1)
SODIUM: 128 mmol/L — AB (ref 135–145)
SODIUM: 135 mmol/L (ref 135–145)

## 2017-08-10 LAB — GLUCOSE, CAPILLARY
GLUCOSE-CAPILLARY: 106 mg/dL — AB (ref 65–99)
GLUCOSE-CAPILLARY: 108 mg/dL — AB (ref 65–99)
Glucose-Capillary: 91 mg/dL (ref 65–99)
Glucose-Capillary: 84 mg/dL (ref 65–99)

## 2017-08-10 LAB — PROTIME-INR
INR: 2.82
Prothrombin Time: 29.4 seconds — ABNORMAL HIGH (ref 11.4–15.2)

## 2017-08-10 MED ORDER — SODIUM CHLORIDE 0.9 % IV SOLN: 100.0000 mL | INTRAVENOUS | Status: DC | PRN

## 2017-08-10 MED ORDER — MIDODRINE HCL 5 MG PO TABS
ORAL_TABLET | ORAL | Status: AC
  Administered 2017-08-10: 10 mg via ORAL
  Filled 2017-08-10: qty 2

## 2017-08-10 MED ORDER — LIDOCAINE-PRILOCAINE 2.5-2.5 % EX CREA: 1.0000 "application " | TOPICAL_CREAM | CUTANEOUS | Status: DC | PRN

## 2017-08-10 MED ORDER — LIDOCAINE HCL (PF) 1 % IJ SOLN
5.0000 mL | INTRAMUSCULAR | Status: DC | PRN
Start: 2017-08-10 — End: 2017-08-10
  Filled 2017-08-10: qty 5

## 2017-08-10 MED ORDER — DARBEPOETIN ALFA 200 MCG/0.4ML IJ SOSY
PREFILLED_SYRINGE | INTRAMUSCULAR | Status: AC
  Administered 2017-08-10: 200 ug via SUBCUTANEOUS
  Filled 2017-08-10: qty 0.4

## 2017-08-10 MED ORDER — ENSURE ENLIVE PO LIQD
237.0000 mL | Freq: Every day | ORAL | Status: DC
  Administered 2017-08-14 – 2017-08-20 (×3): 237 mL via ORAL

## 2017-08-10 MED ORDER — HEPARIN SODIUM (PORCINE) 1000 UNIT/ML DIALYSIS: 20.0000 [IU]/kg | INTRAMUSCULAR | Status: DC | PRN

## 2017-08-10 MED ORDER — WARFARIN SODIUM 2 MG PO TABS
2.0000 mg | ORAL_TABLET | Freq: Once | ORAL | Status: AC
  Administered 2017-08-10: 2 mg via ORAL
  Filled 2017-08-10: qty 1

## 2017-08-10 MED ORDER — HEPARIN SODIUM (PORCINE) 1000 UNIT/ML DIALYSIS: 1000.0000 [IU] | INTRAMUSCULAR | Status: DC | PRN

## 2017-08-10 MED ORDER — ALBUMIN HUMAN 5 % IV SOLN
12.5000 g | Freq: Once | INTRAVENOUS | Status: DC
  Filled 2017-08-10: qty 250

## 2017-08-10 MED ORDER — ALBUMIN HUMAN 25 % IV SOLN
INTRAVENOUS | Status: AC
  Administered 2017-08-10: 12.5 g
  Filled 2017-08-10: qty 50

## 2017-08-10 MED ORDER — ALTEPLASE 2 MG IJ SOLR: 2.0000 mg | Freq: Once | INTRAMUSCULAR | Status: DC | PRN

## 2017-08-10 MED ORDER — PENTAFLUOROPROP-TETRAFLUOROETH EX AERO: 1.0000 "application " | INHALATION_SPRAY | CUTANEOUS | Status: DC | PRN

## 2017-08-10 NOTE — Progress Notes (Signed)
Physical Therapy Session Note  Patient Details  Name: [name] MRN: [id number] Date of Birth: September 02, 1956  Today's Date: 08/10/2017 PT Individual Time: 0900-0925 PT Individual Time Calculation (min): 25 min   Short Term Goals: Week 1:  PT Short Term Goal 1 (Week 1): pt will perform gait with min A 50' in controlled environment PT Short Term Goal 1 - Progress (Week 1): Met PT Short Term Goal 2 (Week 1): pt will perform stair negotiation x 1 step with min A PT Short Term Goal 2 - Progress (Week 1): Progressing toward goal  Skilled Therapeutic Interventions/Progress Updates:    no c/o pain but pt c/o dizziness and nausea on arrival.  Copious emesis and RN aware and in to check on pt.  Pt transferred back to bed with supervision, min assist to transition to supine.  PT provided education on increased activity level related to vestibular system and vestibular eval/exercises.  Pt verbalized understanding, remains in bed with call bell in reach and needs met.   Therapy Documentation Precautions:  Precautions Precautions: Fall Restrictions Weight Bearing Restrictions: No General: PT Amount of Missed Time (min): 35 Minutes PT Missed Treatment Reason: Patient ill (Comment) (Bp 102/52)   See Function Navigator for Current Functional Status.   Therapy/Group: Individual Therapy  Michel Santee 08/10/2017, 12:47 PM

## 2017-08-10 NOTE — Consult Note (Addendum)
[facility] Nurse wound follow up Wound type:unstageable sacral pressure ulcer, also has DTI, stage II, and venous insufficieny wounds. Recheck today is for sacral ulcer which is improving. Measurement:now measures 8cm x 7.5cm  Wound bed:20% black eschar, 80% yellow thin eschar, blanches around perimeter. Pretibial wounds just dressed, did not reassess these. Pts bedside nurse today is a WTA and she did the dressings. Right heel has resolving serous blister, right great toe blood blister resolving, less boggy, drying up, still skin intact.  Drainage (amount, consistency, odor) see above Periwound:see above Dressing procedure/placement/frequency: Ulcer improving, Continue with current orders for santyl dressing changes to sacral ulcer, continue prevalon boots, foam dressings on heels will continue to see if these will dry and stay intact. We will not follow, but will remain available to this patient, to nursing, and the medical and/or surgical teams. Please re-consult if we need to assist further.   Fara Olden, RN-C, WTA-C, [facility]

## 2017-08-10 NOTE — Progress Notes (Signed)
ANTICOAGULATION/ANTIBIOTIC CONSULT NOTE - Follow Up Consult  Pharmacy Consult for Coumadin and Cefazolin Indication: atrial fibrillation and MSSA bacteremia  No Known Allergies  Patient Measurements: Height: 5\' 10"  (177.8 cm) Weight: 187 lb 9.6 oz (85.1 kg) IBW/kg (Calculated) : 73  Vital Signs: Temp: 97.5 F (36.4 C) (09/05 0520) Temp Source: Oral (09/05 0520) BP: 94/50 (09/05 0520) Pulse Rate: 76 (09/05 0520)  Labs:  Recent Labs  08/08/17 0500  08/08/17 1312 08/09/17 0434 08/10/17 0350  HGB  --   < > 9.1* 8.8* 9.1*  HCT  --   --  29.8* 28.8* 30.0*  PLT  --   --  126* 103* 119*  LABPROT 37.5*  --   --  35.4* 29.4*  INR 3.84  --   --  3.57 2.82  CREATININE  --   --  4.43*  --   --   < > = values in this interval not displayed.  Estimated Creatinine Clearance: 17.9 mL/min (A) (by C-G formula based on SCr of 4.43 mg/dL (H)).   Assessment: 60yom continues on coumadin for afib, appears to be very sensitive to dose adjustments. INR back within therapeutic range 2.82 after holding x 2 days. CBC ok.  PTA dose: 2mg  daily except 1mg  Tue/Thur  He also continues on cefazolin for MSSA bacteremia. TEE 8/14 without evidence of endocarditis or vegetation on valves/ICD. ICD unable to be removed. Planning for 6 weeks of total cefazolin then suppressive cephalexin. He was previously on CRRT and has now transitioned to intermittent dialysis on MWF schedule.  Vanc 8/4 x1 Zosyn 8/4>> 8/5 Cefazolin 8/5 >>(9/16) Rifampin 8/5 >>8/8 (d/c'd d/t hyperbilirubinemia)   8/4 bld x2: MSSA in 2/2 8/4 urine: NG 8/5 MRSA PCR: neg 8/6 bld x2>ng   Goal of Therapy:  INR 2-3 Monitor platelets by anticoagulation protocol: Yes   Plan:  1) Coumadin 2mg  tonight 2) Daily INR 3) Continue cefazolin 2g IV q24   Nena Jordan, PharmD, BCPS 08/10/2017 11:07 AM

## 2017-08-10 NOTE — Procedures (Signed)
On HD BP soft to start, goal 3L but will see how BP holds up. Alb PRN Edwardine Deschepper C

## 2017-08-10 NOTE — Progress Notes (Signed)
Physical Therapy Session Note  Patient Details  Name: [name] MRN: [id number] Date of Birth: 1956-10-04  Today's Date: 08/10/2017 PT Missed Time: 45 Minutes Missed Time Reason: Patient ill (Comment) (Pt nauseated and with two episodes of vommitting this morning)  Skilled Therapeutic Interventions/Progress Updates:    Pt declined treatment this session secondary to nausea/vommitting this AM.    Therapy/Group: Individual Therapy  Netta Corrigan, PT, DPT 08/10/2017, 7:57 AM

## 2017-08-10 NOTE — Progress Notes (Signed)
[facility] PHYSICAL MEDICINE & REHABILITATION     PROGRESS NOTE  Subjective/Complaints:  Pt seen sitting up in bed this AM.  He slept well overnight.  Denies complaints.   ROS: Denies CP, SOB, N/V/D.  Objective: Vital Signs: Blood pressure (!) 94/50, pulse 76, temperature (!) 97.5 F (36.4 C), temperature source Oral, resp. rate 16, height 5\' 10"  (1.778 m), weight 85.1 kg (187 lb 9.6 oz), SpO2 100 %. No results found.  Recent Labs  08/09/17 0434 08/10/17 0350  WBC 9.6 10.0  HGB 8.8* 9.1*  HCT 28.8* 30.0*  PLT 103* 119*    Recent Labs  08/08/17 1312  NA 125*  K 4.7  CL 89*  GLUCOSE 173*  BUN 50*  CREATININE 4.43*  CALCIUM 8.6*   CBG (last 3)   Recent Labs  08/09/17 1646 08/09/17 2152 08/10/17 0637  GLUCAP 128* 151* 106*    Wt Readings from Last 3 Encounters:  08/10/17 85.1 kg (187 lb 9.6 oz)  08/02/17 70.2 kg (154 lb 12.2 oz)  06/21/17 79.2 kg (174 lb 8 oz)    Physical Exam:  BP (!) 94/50 (BP Location: Left Arm)   Pulse 76   Temp (!) 97.5 F (36.4 C) (Oral)   Resp 16   Ht 5\' 10"  (1.778 m)   Wt 85.1 kg (187 lb 9.6 oz)   SpO2 100%   BMI 26.92 kg/m  Constitutional: No distress. Vital signs reviewed.  HENT: Normocephalic and atraumatic.  Eyes: EOM are normal. No discharge.  Cardiovascular: Cardiac rate controlled. No JVD. Respiratory: Effort normal and breath sounds normal.  GI: Bowel sounds are normal. He exhibits no distension.  Colostomy in place . Musc: No edema. No tenderness Neurological: Alert.  Motor: b/l UE 4/5 deltoid, biceps, triceps, wrist ext, HI (unchanged).  LLE: 4-/5 HF and 4+/5 KE and 5/5 ADF/PF.  [name]: 3-/5 HF, 4/5 KE and 4+/5 ADF/PF.  Skin: Multiple wounds. Sacral ulcer unstageable.  Psych: flat, cooperative   Assessment/Plan: 1. Functional deficits secondary to debility which require 3+ hours per day of interdisciplinary therapy in a comprehensive inpatient rehab setting. Physiatrist is providing close team supervision and  24 hour management of active medical problems listed below. Physiatrist and rehab team continue to assess barriers to discharge/monitor patient progress toward functional and medical goals.  Function:  Bathing Bathing position   Position: Wheelchair/chair at sink  Bathing parts Body parts bathed by patient: Right arm, Left arm, Chest, Abdomen, Front perineal area, Right upper leg, Left upper leg, Buttocks Body parts bathed by helper: Back  Bathing assist Assist Level: Supervision or verbal cues (Min assist)      Upper Body Dressing/Undressing Upper body dressing   What is the patient wearing?: Pull over shirt/dress     Pull over shirt/dress - Perfomed by patient: Thread/unthread right sleeve, Thread/unthread left sleeve, Put head through opening, Pull shirt over trunk          Upper body assist Assist Level: Set up   Set up : To obtain clothing/put away  Lower Body Dressing/Undressing Lower body dressing   What is the patient wearing?: Underwear, Pants Underwear - Performed by patient: Thread/unthread left underwear leg, Pull underwear up/down, Thread/unthread right underwear leg Underwear - Performed by helper: Thread/unthread right underwear leg Pants- Performed by patient: Thread/unthread right pants leg, Thread/unthread left pants leg, Pull pants up/down Pants- Performed by helper: Thread/unthread right pants leg   Non-skid slipper socks- Performed by helper: Don/doff right sock, Don/doff left sock  Lower body assist Assist for lower body dressing: Set up   Set up : To obtain clothing/put away  Toileting Toileting Toileting activity did not occur: No continent bowel/bladder event   Toileting steps completed by helper: Adjust clothing prior to toileting, Performs perineal hygiene, Adjust clothing after toileting (per Elmo Putt, NT report)    Toileting assist     Transfers Chair/bed transfer   Chair/bed transfer method: Ambulatory Chair/bed  transfer assist level: Supervision or verbal cues Chair/bed transfer assistive device: Medical sales representative     Max distance: 100 Assist level: Supervision or verbal cues   Wheelchair   Type: Manual Max wheelchair distance: 123ft  Assist Level: Supervision or verbal cues  Cognition Comprehension Comprehension assist level: Follows complex conversation/direction with extra time/assistive device  Expression Expression assist level: Expresses basic needs/ideas: With no assist  Social Interaction Social Interaction assist level: Interacts appropriately with others - No medications needed.  Problem Solving Problem solving assist level: Solves basic 75 - 89% of the time/requires cueing 10 - 24% of the time  Memory Memory assist level: Recognizes or recalls 90% of the time/requires cueing < 10% of the time    Medical Problem List and Plan:  1. Severe debilitation/encephalopathy secondary to respiratory failure, CHF, MSSA sepsis   Cont CIR 2. DVT Prophylaxis/Anticoagulation: Chronic Coumadin therapy and monitor for rebleeding episodes   INR therpeutic on 9/5 3. Pain Management: Hydrocodone as needed  4. Mood: Provide emotional support  5. Neuropsych: This patient is capable of making decisions on his own behalf.  6. Skin/Wound Care/lower extremity leg wound: Routine skin checks with skin care as directed   Appreciate WOC recs  Will debride prior to d/c 7. Fluids/Electrolytes/Nutrition: Routine I&Os 8. CKD stage IV. Followed closely by renal services.   On dialysis, appreciate nephro recs  Cont to monitor 9. MSSA bacteremia. Intravenous cefazolin through 08/21/2017 then chronic Keflex and follow-up per infectious disease  10. 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 Routine colostomy care  11. CAD with implantable defibrillator. Follow-up cardiology services. No plan to extract device  12. Chronic systolic congestive heart failure/ischemic  cardiomyopathy. Monitor for any signs of fluid overload.  Filed Weights   08/08/17 2000 08/09/17 0550 08/10/17 0520  Weight: 84 kg (185 lb 3 oz) 84.4 kg (186 lb) 85.1 kg (187 lb 9.6 oz)  13. Diabetes mellitus and peripheral neuropathy.   Lantus insulin 10 decreased to 6U due to hypoglycemia on 9/1, Increased to 8 units on 9/2  Relatively controlled 9/5 14. Orthostasis. Continue ProAmatine 10 mg 3 times a day.  Controlled 15. Acute on chronic anemia. Continue Aranesp.   Hb 9.1 on 9/5 16. History of gout. Colchicine 0.3 mg daily. Monitor for any gout flareups  17. Malnourishment  Supplement initiated 8/29 18. Thrombocytopenia  Plts 119 on 9/5  Cont to monitor  LOS (Days) 8 A FACE TO FACE EVALUATION WAS PERFORMED  Ankit Lorie Phenix 08/10/2017 8:05 AM

## 2017-08-10 NOTE — Progress Notes (Addendum)
Nutrition Follow-up  DOCUMENTATION CODES:   Non-severe (moderate) malnutrition in context of chronic illness  INTERVENTION:  Continue 30 ml Prostat po BID, each supplement provides 100 kcal and 15 grams of protein.   Provide Ensure Enlive po once daily, each supplement provides 350 kcal and 20 grams of protein.  Encourage adequate PO intake.   NUTRITION DIAGNOSIS:   Malnutrition (moderate) related to chronic illness (CKD V) as evidenced by moderate depletion of body fat, severe depletion of muscle mass; ongoing  GOAL:   Patient will meet greater than or equal to 90% of their needs; met  MONITOR:   PO intake, Supplement acceptance, Labs, Weight trends, Skin, I & O's  REASON FOR ASSESSMENT:   Malnutrition Screening Tool    ASSESSMENT:   61 y.o. 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  Presented 07/10/2017 with increasing bilateral lower extremity edema and increasing shortness of breath. Chest x-ray showed right pleural effusion and right lung base atelectasis versus infiltrate. CT abdomen and pelvis showed cirrhosis moderate ascites. Echocardiogram with ejection fraction of 72-09% systolic function severely reduced. TEE completed showing no evidence of endocarditis and no plans to extract ICD device. Renal service follow-up for severe CKD stage IV initially requiring CRRT and latest creatinine 3.45 and presently on dialysis.   Meal completion has been 100%. Pt reports having a good appetite with no other difficulties. Pt currently has Ensure and Prostat ordered and has been consuming them. RD to decrease Ensure to once daily as intake has been adequate.    Nutrition-Focused physical exam completed. Findings are moderate fat depletion, moderate to severe muscle depletion, and mild edema.   Labs and medications reviewed.   Diet Order:  Diet renal/carb modified with fluid restriction Diet-HS Snack? Nothing; Room service appropriate? Yes; Fluid consistency: Thin; Fluid restriction: 1200 mL Fluid  Skin:  Wound (see comment) (lower extremity wounds)  Last BM:  8/29  Height:   Ht Readings from Last 1 Encounters:  08/02/17 _0  (1.778 m)    Weight:   Wt Readings from Last 1 Encounters:  08/10/17 187 lb 9.6 oz (85.1 kg)    Ideal Body Weight:  75.45 kg  BMI:  Body mass index is 26.92 kg/m.  Estimated Nutritional Needs:   Kcal:  2100-2300  Protein:  105-120 grams  Fluid:  1.2 L/day  EDUCATION NEEDS:   No education needs identified at this time  Corrin Parker, MS, RD, LDN Pager # [phone] After hours/ weekend pager # [phone]

## 2017-08-10 NOTE — Progress Notes (Signed)
Occupational Therapy Session Note  Patient Details  Name: [name] MRN: [id number] Date of Birth: September 09, 1956  Today's Date: 08/10/2017 OT Individual Time: 0836-0900 OT Individual Time Calculation (min): 24 min      Skilled Therapeutic Interventions/Progress Updates:    Pt seen this session to focus on activity tolerance and standing tolerance. Pt received in bed after receiving nursing care stating he was very tired and was not feeling well as he had been nauseous earlier in the day.  Pt stated he was not feeling up to therapy and did not want to bathe or change clothing.  Pt eventually agreed to working on standing tolerance.  He was able to stand up for 1 min, then 2 min and then 4 minutes. He stood at sink with S to brush teeth, wash face and then needed to rest. Discussed energy conservation and how to pace activities at home. Such as shower, rest for 2 minutes sitting, shave in standing, rest 2 min is sitting, stand to brush teeth, etc. Will need to reinforce this with his spouse.  Pt resting in w/c with all needs met.  Therapy Documentation Precautions:  Precautions Precautions: Fall Restrictions Weight Bearing Restrictions: No   Pain: c/o mild pain in leg after IV placement   ADL:  See Function Navigator for Current Functional Status.   Therapy/Group: Individual Therapy  [place] 08/10/2017, 9:49 AM

## 2017-08-10 NOTE — Progress Notes (Signed)
Physical Therapy Session Note  Patient Details  Name: [name] MRN: [id number] Date of Birth: 04/27/1956  Today's Date: 08/10/2017 PT Individual Time: 1400-1500 PT Individual Time Calculation (min): 60 min   Short Term Goals: Week 2:  PT Short Term Goal 1 (Week 2): pt will perform gait in controlled environment supervision x 80' PT Short Term Goal 2 (Week 2): pt will perform step up x 1 to simulate home entry with min A  Skilled Therapeutic Interventions/Progress Updates:   Pt received supine in bed and agreeable to PT. Pt reports feeling very fatigued following multiple bouts of N/V this AM. PT assessed BP in supine 94/54. Supervision assist to come to sitting EOB; BP re-assessed in sitting 92/47 with significant dizziness and Lightheadedness. Returned to supine with decreased symptoms, 97/54 when returned to supine. Min assist to control the RLEwith sit>supine   PT instructed pt in Supine LE strengthening.  SLR x 8 [name], x 10 LLE Heels slides x 8 [name], x10LLE Hip adduction/abduction x 10 BLE  SAQ x 10 BLE  Pt required moderate cues for proper technique and improved eccentric control. Pt required multiple prolonged rest breaks due to fatigue.   Pt left supine in bed with call bell in reach and all needs met.           Therapy Documentation Precautions:  Precautions Precautions: Fall Restrictions Weight Bearing Restrictions: No General: PT Amount of Missed Time (min): 35 Minutes PT Missed Treatment Reason: Patient ill (Comment) (Bp 102/52) Pain: 0/10, but reports nausea and fatigue.   See Function Navigator for Current Functional Status.   Therapy/Group: Individual Therapy   E  08/10/2017, 3:11 PM  

## 2017-08-10 NOTE — Progress Notes (Signed)
Pt consistently refuses cpap. RT to monitor and assess as needed.

## 2017-08-11 ENCOUNTER — Inpatient Hospital Stay (HOSPITAL_COMMUNITY): Payer: Medicare Other | Admitting: Physical Therapy

## 2017-08-11 ENCOUNTER — Inpatient Hospital Stay (HOSPITAL_COMMUNITY): Payer: Medicare Other | Admitting: Occupational Therapy

## 2017-08-11 LAB — PROTIME-INR
INR: 2.55
Prothrombin Time: 27.2 seconds — ABNORMAL HIGH (ref 11.4–15.2)

## 2017-08-11 LAB — GLUCOSE, CAPILLARY
GLUCOSE-CAPILLARY: 137 mg/dL — AB (ref 65–99)
Glucose-Capillary: 128 mg/dL — ABNORMAL HIGH (ref 65–99)
Glucose-Capillary: 130 mg/dL — ABNORMAL HIGH (ref 65–99)
Glucose-Capillary: 102 mg/dL — ABNORMAL HIGH (ref 65–99)

## 2017-08-11 MED ORDER — LORATADINE 10 MG PO TABS
10.0000 mg | ORAL_TABLET | Freq: Every day | ORAL | Status: DC
  Administered 2017-08-11 – 2017-08-20 (×10): 10 mg via ORAL
  Filled 2017-08-11 (×10): qty 1

## 2017-08-11 MED ORDER — WARFARIN SODIUM 1 MG PO TABS
1.0000 mg | ORAL_TABLET | Freq: Once | ORAL | Status: AC
  Administered 2017-08-11: 1 mg via ORAL
  Filled 2017-08-11: qty 1

## 2017-08-11 MED ORDER — SALINE SPRAY 0.65 % NA SOLN
1.0000 | NASAL | Status: DC | PRN
  Administered 2017-08-11: 1 via NASAL
  Filled 2017-08-11: qty 44

## 2017-08-11 NOTE — Progress Notes (Signed)
Physical Therapy Note  Patient Details  Name: [name] MRN: [id number] Date of Birth: 1956/09/20 Today's Date: 08/11/2017    Pt received in bed reporting significant fatigue and low BP. Pt declining even bed level exercises on this date. Pt missed 30 minutes of skilled PT treatment; will f/u per POC.   Waunita Schooner 08/11/2017, 3:10 PM

## 2017-08-11 NOTE — Progress Notes (Signed)
PHYSICAL MEDICINE & REHABILITATION     PROGRESS NOTE  Subjective/Complaints:  Pt seen laying in bed this AM.  He slept well overnight.  He is looking forward to discharge next week.   ROS: Denies CP, SOB, N/V/D.  Objective: Vital Signs: Blood pressure (!) 94/42, pulse 70, temperature 98 F (36.7 C), temperature source Oral, resp. rate 17, height 5\' 10"  (1.778 m), weight 82.1 kg (181 lb), SpO2 97 %. No results found.  Recent Labs  08/10/17 0350 08/10/17 1530  WBC 10.0 8.5  HGB 9.1* 8.9*  HCT 30.0* 28.9*  PLT 119* 110*    Recent Labs  08/10/17 1530 08/10/17 2200  NA 128* 135  K 4.1 3.3*  CL 91* 96*  GLUCOSE 168* 93  BUN 40* 16  CREATININE 3.87* 2.11*  CALCIUM 8.6* 8.3*   CBG (last 3)   Recent Labs  08/10/17 1918 08/10/17 2130 08/11/17 0634  GLUCAP 91 84 128*    Wt Readings from Last 3 Encounters:  08/11/17 82.1 kg (181 lb)  08/02/17 70.2 kg (154 lb 12.2 oz)  06/21/17 79.2 kg (174 lb 8 oz)    Physical Exam:  BP (!) 94/42 (BP Location: Left Arm)   Pulse 70   Temp 98 F (36.7 C) (Oral)   Resp 17   Ht 5\' 10"  (1.778 m)   Wt 82.1 kg (181 lb)   SpO2 97%   BMI 25.97 kg/m  Constitutional: No distress. Vital signs reviewed.  HENT: Normocephalic and atraumatic.  Eyes: EOM are normal. No discharge.  Cardiovascular: Cardiac rate controlled. No JVD. Respiratory: Effort normal and breath sounds normal.  GI: Bowel sounds are normal. He exhibits no distension.  Colostomy in place . Musc: No edema. No tenderness Neurological: Alert.  Motor: b/l UE 4/5 deltoid, biceps, triceps, wrist ext, HI (unchanged).  LLE: 4-/5 HF and 4+/5 KE and 5/5 ADF/PF.  [name]: 4-/5 HF, 4/5 KE and 4+/5 ADF/PF.  Skin: Multiple wounds. Sacral ulcer unstageable, not examined today.  Psych: flat, cooperative   Assessment/Plan: 1. Functional deficits secondary to debility which require 3+ hours per day of interdisciplinary therapy in a comprehensive inpatient rehab  setting. Physiatrist is providing close team supervision and 24 hour management of active medical problems listed below. Physiatrist and rehab team continue to assess barriers to discharge/monitor patient progress toward functional and medical goals.  Function:  Bathing Bathing position   Position: Wheelchair/chair at sink  Bathing parts Body parts bathed by patient: Right arm, Left arm, Chest, Abdomen, Front perineal area, Right upper leg, Left upper leg, Buttocks Body parts bathed by helper: Back  Bathing assist Assist Level: Supervision or verbal cues (Min assist)      Upper Body Dressing/Undressing Upper body dressing   What is the patient wearing?: Pull over shirt/dress     Pull over shirt/dress - Perfomed by patient: Thread/unthread right sleeve, Thread/unthread left sleeve, Put head through opening, Pull shirt over trunk          Upper body assist Assist Level: Set up   Set up : To obtain clothing/put away  Lower Body Dressing/Undressing Lower body dressing   What is the patient wearing?: Underwear, Pants Underwear - Performed by patient: Thread/unthread left underwear leg, Pull underwear up/down, Thread/unthread right underwear leg Underwear - Performed by helper: Thread/unthread right underwear leg Pants- Performed by patient: Thread/unthread right pants leg, Thread/unthread left pants leg, Pull pants up/down Pants- Performed by helper: Thread/unthread right pants leg   Non-skid slipper socks- Performed by helper:  Don/doff right sock, Don/doff left sock                  Lower body assist Assist for lower body dressing: Set up   Set up : To obtain clothing/put away  Toileting Toileting Toileting activity did not occur: No continent bowel/bladder event   Toileting steps completed by helper: Adjust clothing prior to toileting, Performs perineal hygiene, Adjust clothing after toileting (per Elmo Putt, NT report)    Toileting assist      Transfers Chair/bed transfer   Chair/bed transfer method: Squat pivot Chair/bed transfer assist level: Supervision or verbal cues Chair/bed transfer assistive device: Armrests     Locomotion Ambulation     Max distance: 100 Assist level: Supervision or verbal cues   Wheelchair   Type: Manual Max wheelchair distance: 16ft  Assist Level: Supervision or verbal cues  Cognition Comprehension Comprehension assist level: Follows complex conversation/direction with extra time/assistive device  Expression Expression assist level: Expresses basic needs/ideas: With no assist  Social Interaction Social Interaction assist level: Interacts appropriately with others - No medications needed.  Problem Solving Problem solving assist level: Solves basic 75 - 89% of the time/requires cueing 10 - 24% of the time  Memory Memory assist level: Recognizes or recalls 90% of the time/requires cueing < 10% of the time    Medical Problem List and Plan:  1. Severe debilitation/encephalopathy secondary to respiratory failure, CHF, MSSA sepsis   Cont CIR 2. DVT Prophylaxis/Anticoagulation: Chronic Coumadin therapy and monitor for rebleeding episodes   INR therpeutic on 9/6 3. Pain Management: Hydrocodone as needed  4. Mood: Provide emotional support  5. Neuropsych: This patient is capable of making decisions on his own behalf.  6. Skin/Wound Care/lower extremity leg wound: Routine skin checks with skin care as directed   Appreciate WOC recs  Will debride prior to d/c 7. Fluids/Electrolytes/Nutrition: Routine I&Os 8. CKD stage IV. Followed closely by renal services.   On dialysis, appreciate nephro recs  Cont to monitor 9. MSSA bacteremia. Intravenous cefazolin through 08/21/2017 then chronic Keflex and follow-up per infectious disease  10. 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 Routine colostomy care  11. CAD with implantable defibrillator. Follow-up cardiology services. No plan to  extract device  12. Chronic systolic congestive heart failure/ischemic cardiomyopathy. Monitor for any signs of fluid overload.  Filed Weights   08/10/17 0520 08/10/17 1945 08/11/17 0500  Weight: 85.1 kg (187 lb 9.6 oz) 83 kg (182 lb 15.7 oz) 82.1 kg (181 lb)  13. Diabetes mellitus and peripheral neuropathy.   Lantus insulin 10 decreased to 6U due to hypoglycemia on 9/1, Increased to 8 units on 9/2  Relatively controlled 9/6 14. Orthostasis. Continue ProAmatine 10 mg 3 times a day.  Controlled 15. Acute on chronic anemia. Continue Aranesp.   Hb 8.9 on 9/5 16. History of gout. Colchicine 0.3 mg daily. Monitor for any gout flareups  17. Malnourishment  Supplement initiated 8/29 18. Thrombocytopenia  Plts 110 on 9/5  Cont to monitor  LOS (Days) 9 A FACE TO FACE EVALUATION WAS PERFORMED  Aarna Mihalko Lorie Phenix 08/11/2017 8:31 AM

## 2017-08-11 NOTE — Progress Notes (Signed)
Assessment/ Plan:   1. Staphylococcus bacteremia with septic/cardiogenic shock: on Ancef. PerID team.  2. ESRD: Continue hemodialysis on a Monday/Wednesday/Friday schedule via right brachiocephalic fistula. Process underway for outpatient dialysis unit placement to [facility] (he was previously a patient there).  Need more aggressive dialysis.  Will do daily 3. Deconditioning: now ongoing rehabilitation in CIR.  Subjective: Interval History: 1300cc off with HD yesterday but weight up, treatment limited by low BP  Objective: Vital signs in last 24 hours: Temp:  [97.6 F (36.4 C)-98 F (36.7 C)] 98 F (36.7 C) (09/06 0500) Pulse Rate:  [67-75] 70 (09/06 0500) Resp:  [16-17] 17 (09/06 0500) BP: (71-103)/(40-57) 94/42 (09/06 0500) SpO2:  [97 %-100 %] 97 % (09/06 0500) Weight:  [82.1 kg (181 lb)-83 kg (182 lb 15.7 oz)] 82.1 kg (181 lb) (09/06 0500) Weight change: -2.095 kg (-4 lb 9.9 oz)  Intake/Output from previous day: 09/05 0701 - 09/06 0700 In: 360 [P.O.:360] Out: 1304  Intake/Output this shift: Total I/O In: 120 [P.O.:120] Out: -   anasarca   abd and flank and presacral edema present  Lab Results:  Recent Labs  08/10/17 0350 08/10/17 1530  WBC 10.0 8.5  HGB 9.1* 8.9*  HCT 30.0* 28.9*  PLT 119* 110*   BMET:  Recent Labs  08/10/17 1530 08/10/17 2200  NA 128* 135  K 4.1 3.3*  CL 91* 96*  CO2 25 29  GLUCOSE 168* 93  BUN 40* 16  CREATININE 3.87* 2.11*  CALCIUM 8.6* 8.3*   No results for input(s): PTH in the last 72 hours. Iron Studies: No results for input(s): IRON, TIBC, TRANSFERRIN, FERRITIN in the last 72 hours. Studies/Results: No results found.  Scheduled: . colchicine  0.3 mg Oral Daily  . collagenase  1 application Topical Daily  . darbepoetin (ARANESP) injection - NON-DIALYSIS  200 mcg Subcutaneous Q Wed-1800  . feeding supplement (ENSURE ENLIVE)  237 mL Oral Q1500  . feeding supplement (PRO-STAT SUGAR FREE 64)  30 mL Oral BID  .  hydrocerin   Topical BID  . insulin aspart  0-5 Units Subcutaneous QHS  . insulin aspart  3 Units Subcutaneous TID WC  . insulin glargine  8 Units Subcutaneous QHS  . loratadine  10 mg Oral Daily  . midodrine  10 mg Oral TID WC  . multivitamin  1 tablet Oral QHS  . sucroferric oxyhydroxide  500 mg Oral TID WC  . warfarin  1 mg Oral ONCE-1800  . Warfarin - Pharmacist Dosing Inpatient   Does not apply q1800     LOS: 9 days   Kanaan Kagawa C 08/11/2017,2:19 PM

## 2017-08-11 NOTE — Progress Notes (Signed)
Occupational Therapy Session Note  Patient Details  Name: [name] MRN: [id number] Date of Birth: 1956/07/17  Today's Date: 08/11/2017 OT Individual Time: 5885-0277 OT Individual Time Calculation (min): 60 min    Skilled Therapeutic Interventions/Progress Updates:    Pt seen for OT session focusing on ADL re-training, activity tolerance and UE strengthening. Pt in supine upon arrival, agreeable to tx session though voicing increased fatigue, however, willing to participate as able.  Completed grooming task standing at sink with supervision, tolerating ~ 2 minutes in standing before requesting seated rest break. He self propelled w/c to gym mod I for UE strengthening/endurance.  Pt completed UE strengthening exercises x10 of each exercise using #3 hand weights  with verbal, demonstrational, and tactile cuing for proper form and technique. Completed internal rotation, tricep extension, chest fly, and overhead press. Rest breaks required throughout to maintain proper form. Pt returned to room at end of session, ambulated to bed with supervision and requiring mod A to return to supine as pt unable to lift LEs onto bed. Pt left in supine with all needs in reach.   Therapy Documentation Precautions:  Precautions Precautions: Fall Restrictions Weight Bearing Restrictions: No Pain:   No/ denies pain  See Function Navigator for Current Functional Status.   Therapy/Group: Individual Therapy  Lewis, Shed Nixon C 08/11/2017, 7:17 AM

## 2017-08-11 NOTE — Progress Notes (Signed)
Occupational Therapy Weekly Progress Note  Patient Details  Name: [name] MRN: [id number] Date of Birth: January 25, 1956  Beginning of progress report period: August 03, 2017 End of progress report period: August 11, 2017    Patient has met 4 of 4 short term goals.  Patient is making steady progress towards goals. Patient requires occasional rest breaks when standing for 2-4 minutes. Patient demonstrates ability to complete basic self-care tasks with set-up and close supervision for patient's safety at a sit to stand level.  Patient has verbalized confidence of d/c home and has no questions or concerns. Wife has verbalized concerns and "anxiousness" about d/c, however has demonstrated understanding of patient's current level of assistance. Wife has been encouraged to provide encouragement to spouse and keep an open communication with patient to understand his needs.  Patient continues to demonstrate occasional moments of dizziness when standing for a long period of time, as well as, decreased activity tolerance/endurance. Continue plan of care to increase patient's activity tolerance and endurance in preparation for d/c home.  Patient continues to demonstrate the following deficits: muscle weakness, decreased cardiorespiratoy endurance and decreased standing balance, decreased postural control and decreased balance strategies and therefore will continue to benefit from skilled OT intervention to enhance overall performance with BADL and Reduce care partner burden.  Patient progressing toward long term goals..  Continue plan of care.  OT Short Term Goals Week 1:  OT Short Term Goal 1 (Week 1): Pt will complete bathing with min assist at sit > stand level OT Short Term Goal 1 - Progress (Week 1): Met OT Short Term Goal 2 (Week 1): Pt will complete LB dressing with mod assist at sit > stand level OT Short Term Goal 2 - Progress (Week 1): Met OT Short Term Goal 3 (Week 1): Pt will complete  toilet transfer with min assist  OT Short Term Goal 3 - Progress (Week 1): Met OT Short Term Goal 4 (Week 1): Pt will complete 2 grooming tasks in standing with min assist for increased standing tolerance OT Short Term Goal 4 - Progress (Week 1): Met Week 2:  OT Short Term Goal 1 (Week 2): STG = LTG due to remaining LOS    Therapy Documentation Precautions:  Precautions Precautions: Fall Restrictions Weight Bearing Restrictions: No General:   Vital Signs: Therapy Vitals Temp: (!) 97.5 F (36.4 C) Temp Source: Oral Pulse Rate: 74 Resp: 18 BP: (!) 90/54 Patient Position (if appropriate): Lying Oxygen Therapy SpO2: 98 % O2 Device: Not Delivered Pain: Pain Assessment Pain Score: 8   See Function Navigator for Current Functional Status.    Hoyt Koch 08/11/2017, 3:38 PM

## 2017-08-11 NOTE — Progress Notes (Signed)
Social Work Patient ID: [name], male   DOB: 1956/07/14, 61 y.o.   MRN: [id number]   CSW met with pt and his wife to update them on team conference discussion.  Pt's targeted d/c date is still set for 08-20-17.  He will finish IV antibiotics that day and will transition to po meds.  Pt will need dialysis m, w, f in the community and CSW will f/u with [facility] to make sure they are prepared for pt and know his d/c date.  CSW reviewed recommended DME with pt/wife and they will discuss it further with therapists and let CSW know what they want ordered.  Pt to have [facility] through [facility] and CSW let liaison know of their preferred therapist who pt had in the past.  CSW will continue to follow and assist as needed.

## 2017-08-11 NOTE — Progress Notes (Signed)
Social Work Patient ID: [name], male   DOB: 1955-12-13, 61 y.o.   MRN: [id number]   [name], Robert Dates, LCSW Social Worker Signed   Patient Care Conference Date of Service: 08/11/2017 11:35 AM      Hide copied text Hover for attribution information Inpatient RehabilitationTeam Conference and Plan of Care Update Date: 08/10/2017   Time: 2:30 PM      Patient Name: [name]      Medical Record Number: [id number]  Date of Birth: 1956/11/12 Sex: Male         Room/Bed: 4W19C/4W19C-01 Payor Info: Payor: MEDICARE / Plan: MEDICARE PART A AND B / Product Type: *No Product type* /     Admitting Diagnosis: Debility  Admit Date/Time:  08/02/2017  5:24 PM Admission Comments: No comment available    Primary Diagnosis:  <principal problem not specified> Principal Problem: <principal problem not specified>       Patient Active Problem List    Diagnosis Date Noted  . Hypoglycemia    . Severe protein-calorie malnutrition (HCC)    . Acute blood loss anemia    . Type 2 diabetes mellitus with peripheral neuropathy (HCC)    . Chronic systolic heart failure (HCC)    . Colostomy care (HCC)    . Chronic kidney disease (CKD), stage IV (severe) (HCC)    . Dependence on renal dialysis (HCC)    . Wounds, multiple    . Supratherapeutic INR    . MSSA bacteremia 08/02/2017  . ESRD (end stage renal disease) (HCC)    . Debility    . Acute on chronic systolic CHF (congestive heart failure) (HCC)    . Bacteremia due to methicillin susceptible Staphylococcus aureus (MSSA)    . Infected defibrillator (HCC)    . Septic hip (HCC)    . Staphylococcus aureus bacteremia with sepsis (HCC)    . Severe sepsis with septic shock (HCC)    . Cardiogenic shock (HCC) 07/10/2017  . Septic shock (HCC) 07/10/2017  . Cardiorenal syndrome with renal failure    . Thrombocytopenia (HCC) 06/14/2017  . GI bleed 04/27/2017  . Portal hypertensive gastropathy (HCC) 04/27/2017  . Hypokalemia 04/27/2017  . Esophageal  dysphagia 04/13/2017  . Hepatic cirrhosis (HCC) 02/24/2017  . Acute on chronic systolic heart failure (HCC)    . Biliary disease with obstruction    . AKI (acute kidney injury) (HCC)    . Abnormal LFTs 05/10/2016  . Pain in the chest    . Right heart failure (HCC) 09/26/2015  . History of colonic polyps 09/24/2015  . Chronic atrial fibrillation (HCC)    . Bleeding from colostomy stoma (HCC) 08/23/2015  . Anemia of chronic disease 07/21/2015  . Constipation 05/01/2015  . Transaminitis 05/01/2015  . NSTEMI- 04/23/15- Myoview scar- no cath 04/27/2015  . Acute respiratory failure (HCC) 04/23/2015  . Chronic cholecystitis 04/04/2015  . Cystitis with hematuria-April 2016 04/04/2015  . Chronically elevated transaminase level-Amiodarone resumed 04/24/15 04/03/2015  . H/O ventricular fibrillation-March 2016 02/24/2015  . BiV ICD (St Jude) gen change 04/10/15 02/24/2015  . Hyperlipidemia    . Dietary noncompliance    . Orthostatic hypotension 07/19/2014  . OSA on CPAP    . GERD (gastroesophageal reflux disease) 06/05/2014  . Gallbladder polyp 03/06/2014  . DM type 2, uncontrolled, with renal complications (HCC) 01/27/2014  . Dizziness 01/26/2014  . Obesity 12/12/2013  . Chronic hypotension 12/11/2013  . Chronic kidney disease, stage IV (severe) (HCC) 12/11/2013  . Chronic a-fib (HCC)    .  CAD with LCX disease and stents to LAD 07/10/2013  . Long term current use of anticoagulant therapy 03/23/2013  . History of rectal cancer    . Ischemic cardiomyopathy-EF 35% 04/24/15 echo        Expected Discharge Date: Expected Discharge Date: 08/20/17   Team Members Present: Physician leading conference: Dr. Maryla Morrow Social Worker Present: Staci Acosta, LCSW Nurse Present: Carlean Purl, RN PT Present: Wanda Plump, PT OT Present: Rosalio Loud, OT SLP Present: Jackalyn Lombard, SLP PPS Coordinator present : Tora Duck, RN, CRRN       Current Status/Progress Goal Weekly Team Focus  Medical       Severe debilitation/encephalopathy secondary to respiratory failure, CHF, MSSA sepsis   Improve mobility, safety, endurance, wounds  See above   Bowel/Bladder     HD; oliguric; ostomy; black feces from colostomy  supervision  assess q shift and prn   Swallow/Nutrition/ Hydration               ADL's     mod assist bathing, min assist LB dsg, min guard mobility with RW  Supervision overall  ADL retraining, transfers, activity tolerance/endurance   Mobility     supervision gait and transfers  supervision gait and transfers  family ed, d/c planning, endurance   Communication               Safety/Cognition/ Behavioral Observations             Pain     norco; c/o pain to hip  <3 out of 10  assess q shift and prn   Skin     bilat heels have foam dressing (eschar); BLE venous ulcers (wound care/dressing changes); unstageable to sacrum (wound care/dressing changes); MASD to groin (MGP)  free of skin breakdown and infection  assess q shift and prn     Rehab Goals Patient on target to meet rehab goals: Yes Rehab Goals Revised: none *See Care Plan and progress notes for long and short-term goals.      Barriers to Discharge   Current Status/Progress Possible Resolutions Date Resolved   Physician     Medical stability;IV antibiotics;Wound Care;Hemodialysis  Therapies, recs per Nephro, wound care     Therapies, adjust coumadin, wound care, neprho recs, optimize DM meds      Nursing                 PT                    OT                 SLP            SW              Discharge Planning/Teaching Needs:  Pt to return to his home with his wife and extended family to assist pt.  Pt's wife will be offered family education prior to pt's d/c.   Team Discussion:  Pt is overall medically stable and wound is healing, but pt may need debridement before d/c.  R heel with blister and WOC RN is following.  Tip of right great toe is being watched.  Managed his colostomy independently today.   Reaching supervision level, but continues to need lots of rest breaks due to low endurance/pt fatigues easily.  Revisions to Treatment Plan:  Pt to complete IV antibiotics prior to d/c.    Continued Need for Acute Rehabilitation Level of Care: The patient  requires daily medical management by a physician with specialized training in physical medicine and rehabilitation for the following conditions: Daily direction of a multidisciplinary physical rehabilitation program to ensure safe treatment while eliciting the highest outcome that is of practical value to the patient.: Yes Daily medical management of patient stability for increased activity during participation in an intensive rehabilitation regime.: Yes Daily analysis of laboratory values and/or radiology reports with any subsequent need for medication adjustment of medical intervention for : Cardiac problems;Pulmonary problems;Diabetes problems;Renal problems;Wound care problems;Other   Joshuwa Vecchio, [place] 08/11/2017, 11:35 AM

## 2017-08-11 NOTE — Progress Notes (Signed)
Occupational Therapy Session Note  Patient Details  Name: [name] MRN: [id number] Date of Birth: 1956/02/02  Today's Date: 08/11/2017 OT Individual Time: 1115-1200 OT Individual Time Calculation (min): 45 min    Short Term Goals: Week 1:  OT Short Term Goal 1 (Week 1): Pt will complete bathing with min assist at sit > stand level OT Short Term Goal 2 (Week 1): Pt will complete LB dressing with mod assist at sit > stand level OT Short Term Goal 3 (Week 1): Pt will complete toilet transfer with min assist  OT Short Term Goal 4 (Week 1): Pt will complete 2 grooming tasks in standing with min assist for increased standing tolerance  Skilled Therapeutic Interventions/Progress Updates:    Engaged in therapeutic activity with focus on leisure tasks and problem solving pt and pt wife concerns regarding d/c.  Completed furniture transfer from w/c to picnic table chair outside hospital with min guard.  Engaged in discussion with pt and wife addressing their concerns regarding d/c answering specifics regarding energy conservation, activity tolerance, and increased participation in functional tasks upon d/c home.  Required mod assist sit > stand from lower seat to transfer back to w/c.  Returned to room and engaged in [place] with use of orange theraband with pt able to correctly recall technique from previous sessions.  Therapy Documentation Precautions:  Precautions Precautions: Fall Restrictions Weight Bearing Restrictions: No General: General PT Missed Treatment Reason: Patient fatigue Pain: Pain Assessment Pain Assessment: 0-10 Pain Score: 7  Pain Type: Acute pain Pain Location: Back Pain Orientation: Lower Pain Descriptors / Indicators: Aching Pain Frequency: Constant Pain Onset: On-going Patients Stated Pain Goal: 3 Pain Intervention(s): Medication (See eMAR)  See Function Navigator for Current Functional Status.   Therapy/Group: Individual Therapy  Simonne Come 08/11/2017, 12:21 PM

## 2017-08-11 NOTE — Progress Notes (Signed)
Physical Therapy Note  Patient Details  Name: [name] MRN: [id number] Date of Birth: 12-Aug-1956 Today's Date: 08/11/2017    Time: 830-910 40 minutes  1:1 Pt c/o low back pain, hot pack applied after session.  Pt performed gait 60' x 2, 100' with supervision with RW.  nustep 3 mins x 2 level 4 with prolonged rest break due to fatigue.  Pt BP 98/51, pt with no c/o "dizzy" or "light headedness".  Pt left in w/c with hot pack applied, needs at hand. Missed final 20 minutes of session due to fatigue.   Erum Cercone 08/11/2017, 9:16 AM

## 2017-08-11 NOTE — Progress Notes (Signed)
ANTICOAGULATION CONSULT NOTE - Follow Up Consult  Pharmacy Consult for Coumadin  Indication: atrial fibrillation   No Known Allergies  Patient Measurements: Height: 5\' 10"  (177.8 cm) Weight: 181 lb (82.1 kg) IBW/kg (Calculated) : 73  Vital Signs: Temp: 98 F (36.7 C) (09/06 0500) Temp Source: Oral (09/06 0500) BP: 94/42 (09/06 0500) Pulse Rate: 70 (09/06 0500)  Labs:  Recent Labs  08/08/17 1312 08/09/17 0434 08/10/17 0350 08/10/17 1530 08/10/17 2200 08/11/17 0439  HGB 9.1* 8.8* 9.1* 8.9*  --   --   HCT 29.8* 28.8* 30.0* 28.9*  --   --   PLT 126* 103* 119* 110*  --   --   LABPROT  --  35.4* 29.4*  --   --  27.2*  INR  --  3.57 2.82  --   --  2.55  CREATININE 4.43*  --   --  3.87* 2.11*  --     Estimated Creatinine Clearance: 37.5 mL/min (A) (by C-G formula based on SCr of 2.11 mg/dL (H)).   Assessment: 60yom continues on coumadin for afib, appears to be very sensitive to dose adjustments. INR therapeutic at 2.55.   Patient-reported PTA dose: 2 mg daily except 1 mg T/Th  Prescribed PTA dose (7/25): 1 mg daily except 2 mg T/Th/Sat  Goal of Therapy:  INR 2-3 Monitor platelets by anticoagulation protocol: Yes   Plan:  1) Coumadin 1mg  tonight 2) Daily INR  Nena Jordan, PharmD, BCPS 08/11/2017 10:49 AM

## 2017-08-11 NOTE — Progress Notes (Signed)
Occupational Therapy Session Note  Patient Details  Name: [name] MRN: [id number] Date of Birth: January 21, 1956  Today's Date: 08/11/2017 OT Individual Time: 5625-6389 OT Individual Time Calculation (min): 54 min    Short Term Goals: Week 1:  OT Short Term Goal 1 (Week 1): Pt will complete bathing with min assist at sit > stand level OT Short Term Goal 2 (Week 1): Pt will complete LB dressing with mod assist at sit > stand level OT Short Term Goal 3 (Week 1): Pt will complete toilet transfer with min assist  OT Short Term Goal 4 (Week 1): Pt will complete 2 grooming tasks in standing with min assist for increased standing tolerance  Skilled Therapeutic Interventions/Progress Updates:    Pt seen to focus on self-care tasks/ADLs and functional mobility/transfer to prepare for d/c. Pt in w/c ready for therapy session with no c/o of pain however c/o head congestion and having difficulty breathing. RN made aware. Pt able to complete grooming tasks at sink while standing ~2:30 before having to sit down. Pt reported feeling dizzy. BP read 93/74 and HR 72. Pt rested before continuing self-care tasks. Pt completed UB/LB bathing at sit > stand level in w/c at sink with close supervision and set-up. Pt able to don/doff UB and LB with supervision and set-up and use of reacher for LE clothing management. Pt reported feeling SOB but stated "it goes away after I rest for a minute". Pt propelled w/c ~60' to promote independence in functional mobility. Pt completed simulated standing shower transfer with use of RW <> w/c. Therapist provided close supervision and verbal cues to recall technique for transfer when using RW. Wife verbalized concern about chair fitting into shower. Therapist educated wife and patient on alternatives for bathing. Pt reported confidence when discussing d/c; however wife reported feeling "anxious/nervous". Pt reported no concerns or questions. Pt left in w/c in room with wife.  Therapy  Documentation Precautions:  Precautions Precautions: Fall Restrictions Weight Bearing Restrictions: No General: General PT Missed Treatment Reason: Patient fatigue Vital Signs:  Pain: Pain Assessment Pain Assessment: 0-10 Pain Score: 7  Pain Type: Acute pain Pain Location: Back Pain Orientation: Lower Pain Descriptors / Indicators: Aching Pain Frequency: Constant Pain Onset: On-going Patients Stated Pain Goal: 3 Pain Intervention(s): Medication (See eMAR)  See Function Navigator for Current Functional Status.   Therapy/Group: Individual Therapy  Hoyt Koch 08/11/2017, 12:31 PM

## 2017-08-11 NOTE — Patient Care Conference (Signed)
Inpatient RehabilitationTeam Conference and Plan of Care Update Date: 08/10/2017   Time: 2:30 PM    Patient Name: [name]      Medical Record Number: [id number]  Date of Birth: Mar 27, 1956 Sex: Male         Room/Bed: 4W19C/4W19C-01 Payor Info: Payor: MEDICARE / Plan: MEDICARE PART A AND B / Product Type: *No Product type* /    Admitting Diagnosis: Debility  Admit Date/Time:  08/02/2017  5:24 PM Admission Comments: No comment available   Primary Diagnosis:  <principal problem not specified> Principal Problem: <principal problem not specified>  Patient Active Problem List   Diagnosis Date Noted  . Hypoglycemia   . Severe protein-calorie malnutrition (HCC)   . Acute blood loss anemia   . Type 2 diabetes mellitus with peripheral neuropathy (HCC)   . Chronic systolic heart failure (HCC)   . Colostomy care (HCC)   . Chronic kidney disease (CKD), stage IV (severe) (HCC)   . Dependence on renal dialysis (HCC)   . Wounds, multiple   . Supratherapeutic INR   . MSSA bacteremia 08/02/2017  . ESRD (end stage renal disease) (HCC)   . Debility   . Acute on chronic systolic CHF (congestive heart failure) (HCC)   . Bacteremia due to methicillin susceptible Staphylococcus aureus (MSSA)   . Infected defibrillator (HCC)   . Septic hip (HCC)   . Staphylococcus aureus bacteremia with sepsis (HCC)   . Severe sepsis with septic shock (HCC)   . Cardiogenic shock (HCC) 07/10/2017  . Septic shock (HCC) 07/10/2017  . Cardiorenal syndrome with renal failure   . Thrombocytopenia (HCC) 06/14/2017  . GI bleed 04/27/2017  . Portal hypertensive gastropathy (HCC) 04/27/2017  . Hypokalemia 04/27/2017  . Esophageal dysphagia 04/13/2017  . Hepatic cirrhosis (HCC) 02/24/2017  . Acute on chronic systolic heart failure (HCC)   . Biliary disease with obstruction   . AKI (acute kidney injury) (HCC)   . Abnormal LFTs 05/10/2016  . Pain in the chest   . Right heart failure (HCC) 09/26/2015  . History of  colonic polyps 09/24/2015  . Chronic atrial fibrillation (HCC)   . Bleeding from colostomy stoma (HCC) 08/23/2015  . Anemia of chronic disease 07/21/2015  . Constipation 05/01/2015  . Transaminitis 05/01/2015  . NSTEMI- 04/23/15- Myoview scar- no cath 04/27/2015  . Acute respiratory failure (HCC) 04/23/2015  . Chronic cholecystitis 04/04/2015  . Cystitis with hematuria-April 2016 04/04/2015  . Chronically elevated transaminase level-Amiodarone resumed 04/24/15 04/03/2015  . H/O ventricular fibrillation-March 2016 02/24/2015  . BiV ICD (St Jude) gen change 04/10/15 02/24/2015  . Hyperlipidemia   . Dietary noncompliance   . Orthostatic hypotension 07/19/2014  . OSA on CPAP   . GERD (gastroesophageal reflux disease) 06/05/2014  . Gallbladder polyp 03/06/2014  . DM type 2, uncontrolled, with renal complications (HCC) 01/27/2014  . Dizziness 01/26/2014  . Obesity 12/12/2013  . Chronic hypotension 12/11/2013  . Chronic kidney disease, stage IV (severe) (HCC) 12/11/2013  . Chronic a-fib (HCC)   . CAD with LCX disease and stents to LAD 07/10/2013  . Long term current use of anticoagulant therapy 03/23/2013  . History of rectal cancer   . Ischemic cardiomyopathy-EF 35% 04/24/15 echo     Expected Discharge Date: Expected Discharge Date: 08/20/17  Team Members Present: Physician leading conference: Dr. Maryla Morrow Social Worker Present: Staci Acosta, LCSW Nurse Present: Carlean Purl, RN PT Present: Wanda Plump, PT OT Present: Rosalio Loud, OT SLP Present: Jackalyn Lombard, SLP PPS Coordinator present :  Tora Duck, RN, CRRN     Current Status/Progress Goal Weekly Team Focus  Medical   Severe debilitation/encephalopathy secondary to respiratory failure, CHF, MSSA sepsis   Improve mobility, safety, endurance, wounds  See above   Bowel/Bladder   HD; oliguric; ostomy; black feces from colostomy  supervision  assess q shift and prn   Swallow/Nutrition/ Hydration             ADL's    mod assist bathing, min assist LB dsg, min guard mobility with RW  Supervision overall  ADL retraining, transfers, activity tolerance/endurance   Mobility   supervision gait and transfers  supervision gait and transfers  family ed, d/c planning, endurance   Communication             Safety/Cognition/ Behavioral Observations            Pain   norco; c/o pain to hip  <3 out of 10  assess q shift and prn   Skin   bilat heels have foam dressing (eschar); BLE venous ulcers (wound care/dressing changes); unstageable to sacrum (wound care/dressing changes); MASD to groin (MGP)  free of skin breakdown and infection  assess q shift and prn    Rehab Goals Patient on target to meet rehab goals: Yes Rehab Goals Revised: none *See Care Plan and progress notes for long and short-term goals.     Barriers to Discharge  Current Status/Progress Possible Resolutions Date Resolved   Physician    Medical stability;IV antibiotics;Wound Care;Hemodialysis  Therapies, recs per Nephro, wound care     Therapies, adjust coumadin, wound care, neprho recs, optimize DM meds      Nursing                  PT                    OT                  SLP                SW                Discharge Planning/Teaching Needs:  Pt to return to his home with his wife and extended family to assist pt.  Pt's wife will be offered family education prior to pt's d/c.   Team Discussion:  Pt is overall medically stable and wound is healing, but pt may need debridement before d/c.  R heel with blister and WOC RN is following.  Tip of right great toe is being watched.  Managed his colostomy independently today.  Reaching supervision level, but continues to need lots of rest breaks due to low endurance/pt fatigues easily.  Revisions to Treatment Plan:  Pt to complete IV antibiotics prior to d/c.    Continued Need for Acute Rehabilitation Level of Care: The patient requires daily medical management by a physician with  specialized training in physical medicine and rehabilitation for the following conditions: Daily direction of a multidisciplinary physical rehabilitation program to ensure safe treatment while eliciting the highest outcome that is of practical value to the patient.: Yes Daily medical management of patient stability for increased activity during participation in an intensive rehabilitation regime.: Yes Daily analysis of laboratory values and/or radiology reports with any subsequent need for medication adjustment of medical intervention for : Cardiac problems;Pulmonary problems;Diabetes problems;Renal problems;Wound care problems;Other  Sacheen Arrasmith, [place] 08/11/2017, 11:35 AM

## 2017-08-12 ENCOUNTER — Inpatient Hospital Stay (HOSPITAL_COMMUNITY): Payer: Medicare Other

## 2017-08-12 ENCOUNTER — Inpatient Hospital Stay (HOSPITAL_COMMUNITY): Payer: Medicare Other | Admitting: Occupational Therapy

## 2017-08-12 ENCOUNTER — Inpatient Hospital Stay (HOSPITAL_COMMUNITY): Payer: Medicare Other | Admitting: Physical Therapy

## 2017-08-12 ENCOUNTER — Encounter (HOSPITAL_COMMUNITY): Payer: Self-pay | Admitting: Interventional Radiology

## 2017-08-12 DIAGNOSIS — N179 Acute kidney failure, unspecified: Secondary | ICD-10-CM

## 2017-08-12 DIAGNOSIS — I5023 Acute on chronic systolic (congestive) heart failure: Secondary | ICD-10-CM

## 2017-08-12 HISTORY — PX: IR REMOVAL TUN CV CATH W/O FL: IMG2289

## 2017-08-12 LAB — RENAL FUNCTION PANEL
ANION GAP: 12 (ref 5–15)
Albumin: 1.9 g/dL — ABNORMAL LOW (ref 3.5–5.0)
BUN: 42 mg/dL — ABNORMAL HIGH (ref 6–20)
CO2: 24 mmol/L (ref 22–32)
CREATININE: 3.86 mg/dL — AB (ref 0.61–1.24)
Calcium: 8.7 mg/dL — ABNORMAL LOW (ref 8.9–10.3)
Chloride: 91 mmol/L — ABNORMAL LOW (ref 101–111)
GFR, EST AFRICAN AMERICAN: 18 mL/min — AB (ref >60)
GFR, EST NON AFRICAN AMERICAN: 15 mL/min — AB (ref >60)
Glucose, Bld: 119 mg/dL — ABNORMAL HIGH (ref 65–99)
POTASSIUM: 4 mmol/L (ref 3.5–5.1)
Phosphorus: 2.9 mg/dL (ref 2.5–4.6)
Sodium: 127 mmol/L — ABNORMAL LOW (ref 135–145)

## 2017-08-12 LAB — GLUCOSE, CAPILLARY
GLUCOSE-CAPILLARY: 143 mg/dL — AB (ref 65–99)
Glucose-Capillary: 141 mg/dL — ABNORMAL HIGH (ref 65–99)
Glucose-Capillary: 104 mg/dL — ABNORMAL HIGH (ref 65–99)

## 2017-08-12 LAB — CBC
HCT: 28.8 % — ABNORMAL LOW (ref 39.0–52.0)
HEMOGLOBIN: 9.2 g/dL — AB (ref 13.0–17.0)
MCH: 31.1 pg (ref 26.0–34.0)
MCHC: 31.9 g/dL (ref 30.0–36.0)
MCV: 97.3 fL (ref 78.0–100.0)
PLATELETS: 132 10*3/uL — AB (ref 150–400)
RBC: 2.96 MIL/uL — AB (ref 4.22–5.81)
RDW: 20.5 % — ABNORMAL HIGH (ref 11.5–15.5)
WBC: 9.6 10*3/uL (ref 4.0–10.5)

## 2017-08-12 LAB — PROTIME-INR
INR: 2.92
PROTHROMBIN TIME: 30.2 s — AB (ref 11.4–15.2)

## 2017-08-12 MED ORDER — CHLORHEXIDINE GLUCONATE 4 % EX LIQD
CUTANEOUS | Status: AC
  Filled 2017-08-12: qty 15

## 2017-08-12 MED ORDER — ALBUMIN HUMAN 25 % IV SOLN
INTRAVENOUS | Status: AC
  Filled 2017-08-12: qty 100

## 2017-08-12 MED ORDER — FUROSEMIDE 80 MG PO TABS
160.0000 mg | ORAL_TABLET | Freq: Every day | ORAL | Status: DC
  Administered 2017-08-13 – 2017-08-16 (×4): 160 mg via ORAL
  Filled 2017-08-12 (×4): qty 2

## 2017-08-12 MED ORDER — WARFARIN 0.5 MG HALF TABLET
0.5000 mg | ORAL_TABLET | Freq: Once | ORAL | Status: AC
  Administered 2017-08-12: 0.5 mg via ORAL
  Filled 2017-08-12: qty 1

## 2017-08-12 MED ORDER — LIDOCAINE HCL (PF) 1 % IJ SOLN: 5.0000 mL | INTRAMUSCULAR | Status: DC | PRN

## 2017-08-12 MED ORDER — MIDODRINE HCL 5 MG PO TABS
ORAL_TABLET | ORAL | Status: AC
  Administered 2017-08-12: 10 mg via ORAL
  Filled 2017-08-12: qty 2

## 2017-08-12 MED ORDER — PENTAFLUOROPROP-TETRAFLUOROETH EX AERO: 1.0000 "application " | INHALATION_SPRAY | CUTANEOUS | Status: DC | PRN

## 2017-08-12 MED ORDER — HEPARIN SODIUM (PORCINE) 1000 UNIT/ML DIALYSIS: 1000.0000 [IU] | INTRAMUSCULAR | Status: DC | PRN

## 2017-08-12 MED ORDER — LIDOCAINE-PRILOCAINE 2.5-2.5 % EX CREA: 1.0000 "application " | TOPICAL_CREAM | CUTANEOUS | Status: DC | PRN

## 2017-08-12 MED ORDER — ALTEPLASE 2 MG IJ SOLR: 2.0000 mg | Freq: Once | INTRAMUSCULAR | Status: DC | PRN

## 2017-08-12 MED ORDER — SODIUM CHLORIDE 0.9 % IV SOLN: 100.0000 mL | INTRAVENOUS | Status: DC | PRN

## 2017-08-12 MED ORDER — HEPARIN SODIUM (PORCINE) 1000 UNIT/ML DIALYSIS: 20.0000 [IU]/kg | INTRAMUSCULAR | Status: DC | PRN

## 2017-08-12 NOTE — Progress Notes (Signed)
Physical Therapy Session Note  Patient Details  Name: [name] MRN: [id number] Date of Birth: 1956/07/20  Today's Date: 08/12/2017 PT Individual Time: 1005-1100 PT Individual Time Calculation (min): 55 min   Short Term Goals: Week 2:  PT Short Term Goal 1 (Week 2): pt will perform gait in controlled environment supervision x 80' PT Short Term Goal 2 (Week 2): pt will perform step up x 1 to simulate home entry with min A  Skilled Therapeutic Interventions/Progress Updates: Pt received supine in bed, denies pain but does c/o sinus discomfort. Gait with RW to day room 2x70-80' per trial with seated rest break between d/t fatigue. Performed nustep 2x4 min with BUE/BLE on level 5 with average 50 steps/min, rest break between bouts. Standing heel raises x15 reps; pt reporting increased R hip/quad pain following one set and declined additional attempts. Stand pivot transfer bed>w/c and w/c <>mat table with min guard, no AD. Sidelying clamshells 2x15, supine bridging 2x10. Educated pt on importance of strengthening hip musculature to support painful hip, improve balance and safety with transfers and gait. Provided pt with handout with pictures of LE exercises to allow performance independently outside of regular therapy sessions. Returned to bed with min guard stand pivot, minA sit >supine for [name] management. Educated pt and wife on new orders for 15/7, however extra sessions scheduled today to make up for previous missed time before 15/7 orders were made; encouraged pt to continue participation in next session even at lower level activity if needed. Handoff to OT at end of session.      Therapy Documentation Precautions:  Precautions Precautions: Fall Restrictions Weight Bearing Restrictions: No   See Function Navigator for Current Functional Status.   Therapy/Group: Individual Therapy  Luberta Mutter 08/12/2017, 11:10 AM

## 2017-08-12 NOTE — Progress Notes (Signed)
Patient refuses to wear CPAP.

## 2017-08-12 NOTE — Progress Notes (Signed)
Report called from Dialysis, patient stable , B/P 108/54, HR 69, no c/o removed 3 Liters,- total

## 2017-08-12 NOTE — Progress Notes (Signed)
On call( Dr.Kwiatkowski) notified after receiving a call from the Dialysis RN concerning abnormal Na+ level of 127 pre- procedure , no new orders will assess in the morning

## 2017-08-12 NOTE — Progress Notes (Signed)
Dialysis treatment completed.  3500 mL ultrafiltrated and net fluid removal 3000 mL.    Patient status unchanged. Lung sounds diminished to ausculation in all fields. Generalized edema. Cardiac: NSR.  Disconnected lines and removed needles.  Pressure held for 10 minutes and band aid/gauze dressing applied.  Report given to bedside RN, Katharine Look.

## 2017-08-12 NOTE — Procedures (Signed)
BP 113/65 Anasarca and weigh up.  Will give albumin. On Midodrine. Also will start furosemide and see if this helps.  Will do dialysis again on Sat. [name]

## 2017-08-12 NOTE — Progress Notes (Signed)
Occupational Therapy Note -MAKEUP SESSION  Patient Details  Name: DWON SKY MRN: [id number] Date of Birth: 11/03/1956  Today's Date: 08/12/2017 OT Individual Time: 7409-7964 OT Individual Time Calculation (min): 25 min   C/o severe nasal congestion discomfort - RN aware  Pt received sitting in w/c stating he felt miserable from the congestion and was very fatigued from prior session.  He reports the congestion makes him dizzy then he often needs to vomit. Instructed pt in facial massage exercises to relieve pressure around the sinuses. Pt worked on these and then stated he felt a small amount of relief. Attempted some exercises, but pt declined knowing he was scheduled for 2 more hours before lunch.  Pt requested to get into bed.  Ambulated 10 ft from chair to bed with RW with S. Min A to lift one leg into the bed.  Pt's wife arrived and reviewed with her the massage exercises. Pt resting with all needs met.  Clay 08/12/2017, 12:26 PM

## 2017-08-12 NOTE — Progress Notes (Signed)
Lasix 160mg  po dose held per on call MD

## 2017-08-12 NOTE — Progress Notes (Signed)
ANTICOAGULATION CONSULT NOTE - Follow Up Consult  Pharmacy Consult for Coumadin  Indication: atrial fibrillation   No Known Allergies  Patient Measurements: Height: 5\' 10"  (177.8 cm) Weight: 183 lb 6.8 oz (83.2 kg) IBW/kg (Calculated) : 73  Vital Signs: Temp: 97.5 F (36.4 C) (09/07 0500) Temp Source: Oral (09/07 0500) BP: 98/50 (09/07 0500) Pulse Rate: 73 (09/07 0500)  Labs:  Recent Labs  08/10/17 0350 08/10/17 1530 08/10/17 2200 08/11/17 0439 08/12/17 0428  HGB 9.1* 8.9*  --   --   --   HCT 30.0* 28.9*  --   --   --   PLT 119* 110*  --   --   --   LABPROT 29.4*  --   --  27.2* 30.2*  INR 2.82  --   --  2.55 2.92  CREATININE  --  3.87* 2.11*  --   --     Estimated Creatinine Clearance: 37.5 mL/min (A) (by C-G formula based on SCr of 2.11 mg/dL (H)).   Assessment: 60yom continues on coumadin for afib, appears to be very sensitive to dose adjustments. INR therapeutic at 2.92 and trending up.   Patient-reported PTA dose: 2 mg daily except 1 mg T/Th  Prescribed PTA dose (7/25): 1 mg daily except 2 mg T/Th/Sat  Goal of Therapy:  INR 2-3 Monitor platelets by anticoagulation protocol: Yes   Plan:  1) Coumadin 0.5mg  tonight 2) Daily INR  Nena Jordan, PharmD, BCPS 08/12/2017 11:45 AM

## 2017-08-12 NOTE — Progress Notes (Signed)
[facility] PHYSICAL MEDICINE & REHABILITATION     PROGRESS NOTE  Subjective/Complaints:  Pt seen sitting up at EOB this AM eating breakfast.  He slept well overnight overnight and states that he has a lot of therapies today, but is ready for it.    ROS: Denies CP, SOB, N/V/D.  Objective: Vital Signs: Blood pressure (!) 98/50, pulse 73, temperature (!) 97.5 F (36.4 C), temperature source Oral, resp. rate 16, height 5\' 10"  (1.778 m), weight 83.2 kg (183 lb 6.8 oz), SpO2 93 %. No results found.  Recent Labs  08/10/17 0350 08/10/17 1530  WBC 10.0 8.5  HGB 9.1* 8.9*  HCT 30.0* 28.9*  PLT 119* 110*    Recent Labs  08/10/17 1530 08/10/17 2200  NA 128* 135  K 4.1 3.3*  CL 91* 96*  GLUCOSE 168* 93  BUN 40* 16  CREATININE 3.87* 2.11*  CALCIUM 8.6* 8.3*   CBG (last 3)   Recent Labs  08/11/17 1634 08/11/17 2109 08/12/17 0659  GLUCAP 102* 137* 141*    Wt Readings from Last 3 Encounters:  08/12/17 83.2 kg (183 lb 6.8 oz)  08/02/17 70.2 kg (154 lb 12.2 oz)  06/21/17 79.2 kg (174 lb 8 oz)    Physical Exam:  BP (!) 98/50 (BP Location: Left Arm)   Pulse 73   Temp (!) 97.5 F (36.4 C) (Oral)   Resp 16   Ht 5\' 10"  (1.778 m)   Wt 83.2 kg (183 lb 6.8 oz)   SpO2 93%   BMI 26.32 kg/m  Constitutional: No distress. Vital signs reviewed.  HENT: Normocephalic and atraumatic.  Eyes: EOM are normal. No discharge.  Cardiovascular: Cardiac rate controlled. No JVD. Respiratory: Effort normal and breath sounds normal.  GI: Bowel sounds are normal. He exhibits no distension.  Colostomy in place . Musc: No edema. No tenderness Neurological: Alert.  Motor: b/l UE 4/5 deltoid, biceps, triceps, wrist ext, HI (stable).  LLE: 4-/5 HF and 4+/5 KE and 5/5 ADF/PF.  [name]: 4-/5 HF, 4/5 KE and 4+/5 ADF/PF. (stable) Skin: Multiple wounds. Sacral ulcer unstageable, not examined today.  Psych: flat, cooperative   Assessment/Plan: 1. Functional deficits secondary to debility which require  3+ hours per day of interdisciplinary therapy in a comprehensive inpatient rehab setting. Physiatrist is providing close team supervision and 24 hour management of active medical problems listed below. Physiatrist and rehab team continue to assess barriers to discharge/monitor patient progress toward functional and medical goals.  Function:  Bathing Bathing position   Position: Wheelchair/chair at sink  Bathing parts Body parts bathed by patient: Right arm, Left arm, Chest, Abdomen, Front perineal area, Buttocks, Right upper leg, Left upper leg Body parts bathed by helper: Back  Bathing assist Assist Level: Supervision or verbal cues      Upper Body Dressing/Undressing Upper body dressing   What is the patient wearing?: Pull over shirt/dress     Pull over shirt/dress - Perfomed by patient: Thread/unthread right sleeve, Thread/unthread left sleeve, Put head through opening, Pull shirt over trunk          Upper body assist Assist Level: Set up   Set up : To obtain clothing/put away  Lower Body Dressing/Undressing Lower body dressing   What is the patient wearing?: Underwear, Pants Underwear - Performed by patient: Thread/unthread right underwear leg, Thread/unthread left underwear leg, Pull underwear up/down Underwear - Performed by helper: Thread/unthread right underwear leg Pants- Performed by patient: Thread/unthread right pants leg, Thread/unthread left pants leg, Pull pants  up/down, Fasten/unfasten pants Pants- Performed by helper: Thread/unthread right pants leg   Non-skid slipper socks- Performed by helper: Don/doff right sock, Don/doff left sock                  Lower body assist Assist for lower body dressing: Set up   Set up : To obtain clothing/put away  Toileting Toileting Toileting activity did not occur: No continent bowel/bladder event   Toileting steps completed by helper: Adjust clothing prior to toileting, Performs perineal hygiene, Adjust clothing  after toileting (per Elmo Putt, NT report)    Toileting assist     Transfers Chair/bed transfer   Chair/bed transfer method: Squat pivot Chair/bed transfer assist level: Supervision or verbal cues Chair/bed transfer assistive device: Armrests     Locomotion Ambulation     Max distance: 100 Assist level: Supervision or verbal cues   Wheelchair   Type: Manual Max wheelchair distance: 111ft  Assist Level: Supervision or verbal cues  Cognition Comprehension Comprehension assist level: Follows complex conversation/direction with extra time/assistive device  Expression Expression assist level: Expresses basic needs/ideas: With no assist  Social Interaction Social Interaction assist level: Interacts appropriately with others - No medications needed.  Problem Solving Problem solving assist level: Solves basic 75 - 89% of the time/requires cueing 10 - 24% of the time  Memory Memory assist level: Recognizes or recalls 90% of the time/requires cueing < 10% of the time    Medical Problem List and Plan:  1. Severe debilitation/encephalopathy secondary to respiratory failure, CHF, MSSA sepsis   Cont CIR 2. DVT Prophylaxis/Anticoagulation: Chronic Coumadin therapy and monitor for rebleeding episodes   INR therpeutic on 9/7 3. Pain Management: Hydrocodone as needed  4. Mood: Provide emotional support  5. Neuropsych: This patient is capable of making decisions on his own behalf.  6. Skin/Wound Care/lower extremity leg wound: Routine skin checks with skin care as directed   Appreciate WOC recs  Will debride prior to d/c 7. Fluids/Electrolytes/Nutrition: Routine I&Os 8. CKD stage IV. Followed closely by renal services.   On dialysis, appreciate nephro recs  Cont to monitor 9. MSSA bacteremia. Intravenous cefazolin through 08/21/2017 then chronic Keflex and follow-up per infectious disease  10. 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 Routine colostomy care  11. CAD  with implantable defibrillator. Follow-up cardiology services. No plan to extract device  12. Chronic systolic congestive heart failure/ischemic cardiomyopathy. Monitor for any signs of fluid overload.  Filed Weights   08/10/17 1945 08/11/17 0500 08/12/17 0500  Weight: 83 kg (182 lb 15.7 oz) 82.1 kg (181 lb) 83.2 kg (183 lb 6.8 oz)  13. Diabetes mellitus and peripheral neuropathy.   Lantus insulin 10 decreased to 6U due to hypoglycemia on 9/1, Increased to 8 units on 9/2  Relatively controlled 9/7  Will consider further adjustments if necessary 14. Orthostasis. Continue ProAmatine 10 mg 3 times a day.  Controlled 15. Acute on chronic anemia. Continue Aranesp.   Hb 8.9 on 9/5  Labs with HD 16. History of gout. Colchicine 0.3 mg daily. Monitor for any gout flareups  17. Malnourishment  Supplement initiated 8/29 18. Thrombocytopenia  Plts 110 on 9/5  Labs with HD  Cont to monitor  LOS (Days) 10 A FACE TO FACE EVALUATION WAS PERFORMED  Ankit Lorie Phenix 08/12/2017 8:19 AM

## 2017-08-12 NOTE — Progress Notes (Signed)
Occupational Therapy Session Note  Patient Details  Name: ANUBIS FUNDORA MRN: [id number] Date of Birth: 03-May-1956  Today's Date: 08/12/2017 OT Individual Time: 0103-0131 OT Individual Time Calculation (min): 28 min    Short Term Goals: Week 2:  OT Short Term Goal 1 (Week 2): STG = LTG due to remaining LOS  Skilled Therapeutic Interventions/Progress Updates:    Pt seen to focus on BUE strengthening to promote improvement of independence in functional tasks and self-care and improve overall UB strength. Pt c/o fatigue but willing to participate in UB exercises sitting EOB. Pt completed BUE exercise with theraband 1 x10 of shoulder horizontal abd, flexion, and crossing midline left<>right. Pt completed BUE exercises with weighted ball 2lbs 2 x10 of crossing midline left<>right and shoulder flexion/extension. Pt returned to supine due to fatigue and left with all needs in reach.  Therapy Documentation Precautions:  Precautions Precautions: Fall Restrictions Weight Bearing Restrictions: No  Vital Signs: Therapy Vitals BP: (!) 101/50   Pain: Pt with no c/o of pain.  See Function Navigator for Current Functional Status.   Therapy/Group: Individual Therapy  Hoyt Koch 08/12/2017, 2:43 PM

## 2017-08-12 NOTE — Progress Notes (Signed)
Patient ID: [name], male   DOB: March 20, 1956, 61 y.o.   MRN: [id number]     Advanced Heart Failure Rounding Note  Primary Cardiologist: Bensimhon  Subjective:    Admitted with staph bacteremia with mixed septic/cardiogenic shock.   TEE 07/19/17 with LVEF 20%. No evidence of endocarditis or vegetation on valves/ICD CVVD stopped 07/20/17. Tolerated iHD 7/27  Continues to work hard with therapy.   Denies SOB. Complaining of fatigue.          Objective:   Weight Range: 183 lb 6.8 oz (83.2 kg) Body mass index is 26.32 kg/m.   Vital Signs:   Temp:  [97.5 F (36.4 C)] 97.5 F (36.4 C) (09/07 0500) Pulse Rate:  [73-76] 73 (09/07 0500) Resp:  [16-18] 16 (09/07 0500) BP: (87-106)/(45-54) 98/50 (09/07 0500) SpO2:  [93 %-100 %] 93 % (09/07 0500) Weight:  [183 lb 6.8 oz (83.2 kg)] 183 lb 6.8 oz (83.2 kg) (09/07 0500) Last BM Date: 08/11/17 (colostomy)  Weight change: Filed Weights   08/10/17 1945 08/11/17 0500 08/12/17 0500  Weight: 182 lb 15.7 oz (83 kg) 181 lb (82.1 kg) 183 lb 6.8 oz (83.2 kg)    Intake/Output:   Intake/Output Summary (Last 24 hours) at 08/12/17 1130 Last data filed at 08/11/17 1933  Gross per 24 hour  Intake              360 ml  Output              200 ml  Net              160 ml      Physical Exam  General:  In the gym. NAD.  HEENT: normal Neck: supple. JVP 10 with prominent CV waves. no JVD. Carotids 2+ bilat; no bruits. No lymphadenopathy or thryomegaly appreciated. Cor: PMI nondisplaced. Regular rate & rhythm. No rubs, gallops or murmurs. Lungs: clear Abdomen: soft, nontender, nondistended. No hepatosplenomegaly. No bruits or masses. LLQ colostomy.  Good bowel sounds. Extremities: no cyanosis, clubbing, rash, R and LLE 1-2 + edema. Dressings in place.  Neuro: alert & orientedx3, cranial nerves grossly intact. moves all 4 extremities w/o difficulty. Affect pleasant  Telemetry   NA  Labs    CBC  Recent Labs  08/10/17 0350  08/10/17 1530  WBC 10.0 8.5  HGB 9.1* 8.9*  HCT 30.0* 28.9*  MCV 98.7 97.0  PLT 119* 409*   Basic Metabolic Panel  Recent Labs  08/10/17 1530 08/10/17 2200  NA 128* 135  K 4.1 3.3*  CL 91* 96*  CO2 25 29  GLUCOSE 168* 93  BUN 40* 16  CREATININE 3.87* 2.11*  CALCIUM 8.6* 8.3*  PHOS 3.2 1.9*   Liver Function Tests  Recent Labs  08/10/17 1530 08/10/17 2200  ALBUMIN 1.7* 2.1*   No results for input(s): LIPASE, AMYLASE in the last 72 hours. Cardiac Enzymes No results for input(s): CKTOTAL, CKMB, CKMBINDEX, TROPONINI in the last 72 hours.  BNP: BNP (last 3 results)  Recent Labs  06/13/17 1205 07/09/17 2151  BNP 1,151.0* 1,553.9*    ProBNP (last 3 results) No results for input(s): PROBNP in the last 8760 hours.   D-Dimer No results for input(s): DDIMER in the last 72 hours. Hemoglobin A1C No results for input(s): HGBA1C in the last 72 hours. Fasting Lipid Panel No results for input(s): CHOL, HDL, LDLCALC, TRIG, CHOLHDL, LDLDIRECT in the last 72 hours. Thyroid Function Tests No results for input(s): TSH, T4TOTAL, T3FREE, THYROIDAB in the last  72 hours.  Invalid input(s): FREET3  Other results:   Imaging   No results found.  Medications:     Scheduled Medications: . colchicine  0.3 mg Oral Daily  . collagenase  1 application Topical Daily  . darbepoetin (ARANESP) injection - NON-DIALYSIS  200 mcg Subcutaneous Q Wed-1800  . feeding supplement (ENSURE ENLIVE)  237 mL Oral Q1500  . feeding supplement (PRO-STAT SUGAR FREE 64)  30 mL Oral BID  . hydrocerin   Topical BID  . insulin aspart  0-5 Units Subcutaneous QHS  . insulin aspart  3 Units Subcutaneous TID WC  . insulin glargine  8 Units Subcutaneous QHS  . loratadine  10 mg Oral Daily  . midodrine  10 mg Oral TID WC  . multivitamin  1 tablet Oral QHS  . sucroferric oxyhydroxide  500 mg Oral TID WC  . Warfarin - Pharmacist Dosing Inpatient   Does not apply q1800    Infusions: . albumin  human    .  ceFAZolin (ANCEF) IV 2 g (08/11/17 1815)  . ferric gluconate (FERRLECIT/NULECIT) IV 125 mg (08/10/17 1922)    PRN Medications: acetaminophen, alum & mag hydroxide-simeth, HYDROcodone-acetaminophen, ondansetron **OR** ondansetron (ZOFRAN) IV, sodium chloride, sodium chloride flush, sorbitol    Patient Profile   [name] is a 61 year old with a history of DMII, [place] 2016, rectal cancer, CKD Stage IV, chronic A fib, S/P AVN ablation, PAF, CAD BMS LAD 2011 BMS LAD 2009, OSA, chronic systolic heart failure, St Jude BiV ICD admitted with AKI and shock with elevated lactate, suspect cardiogenic.   Assessment/Plan   1. Acute on chronic systolic CHF/cardiogenic shock: Last echo in 8/17 with EF 15-20%.  Ischemic cardiomyopathy, has St Jude BiV ICD (despite chronic afib appears to be effectively BiV pacing).  Had recent admission (discharged 06/21/17) requiring milrinone support and diuresis.  Admitted with soft BP (SBP 80s-90s) + elevated lactate.  Suspect mixed septic/cardiogenic shock with MSSA bacteremia.  - CVVHD stopped 07/20/17.  - Tolerating iHD but unable to get him completely dry to hypotension and severe HF -  No beta blocker with decompensation, hypotension. SBP remains soft. On midodrine.  - No ARB with AKI.   2. AKI on CKD stage 4:  Trying daily iHD for now.   Outpatient HD site in [place] is being arranged.  -Continue midodrine.  -- Management per Nephrology  3. Atrial fibrillation: Chronic - Continue coumadin. No plans to extract device.  - INR 2.92. Pharmacy dosing.   4. CAD:  - No ischemia s/s  - Continue ASA. No statin with recent elevated LFT's    5. ID:  -MSSA bacteremia  -Blood CX 2/2 MSSA. Antibiotics narrowed, now on Ancef. 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   - Not planning ICD extraction for now due to severe comorbidities. He is device dependent.  - TEE 07/19/17 with no evidence of endocarditis.  -Per ID continue cefazolin until  9/15 the start renally-dosed cephalexin.  -PICC placed.  No fever.  6. H/O Rectal CA with diverting colostomy   7. Type II diabetes:  - Continue sliding scale. Glucose stable.    8. Liver failure:  LFTs elevated on admit. Resolved.   9. Anemia of chronic disease. - s/p RBCs on 8/17.    10. Deconditioning-- CIR appreciated.    D/C set up for next week.   Length of Stay: [place], NP  08/12/2017, 11:30 AM  Advanced Heart Failure Team Pager [phone] (M-F; 7a -  4p)  Please contact [facility] for night-coverage after hours (4p -7a ) and weekends on [email or web address]  Patient seen and examined with Darrick Grinder, NP. We discussed all aspects of the encounter. I agree with the assessment and plan as stated above.   Getting stronger with rehab but remains weak. Management of volume status remains challenging with HD due to severe HF.   Will continue current plan. I am very concerned about his prognosis.  Glori Bickers, MD  3:27 PM

## 2017-08-12 NOTE — Progress Notes (Signed)
Occupational Therapy Session Note  Patient Details  Name: [name] MRN: [id number] Date of Birth: 1956/02/22  Today's Date: 08/12/2017 OT Individual Time: 6922-3009 OT Individual Time Calculation (min): 60 min    Short Term Goals: Week 2:  OT Short Term Goal 1 (Week 2): STG = LTG due to remaining LOS  Skilled Therapeutic Interventions/Progress Updates:    Pt seen to focus on self-care/ADLs and functional mobility in preparation for d/c. Pt reporting he is feeling well this morning but seemed "anxious" about having 5 therapy sessions today. Discussed pt needs/wants of hospital bed at home and benefits of it. Sit > stand from EOB completed with close supervision and bed raised to promote independence in mobility. Pt ambulated ~5' x2 and stood ~3 min. to obtain clothing from suitcase using RW with close supervision and set up for pt safety. Pt completed UB and LB bathing sit > stand level at sink with supervision and set-up. Pt able to perform UB dressing with supervision/set-up. Therapist provided min. A for LB dressing to assist pt threading pants over non-skid socks with use of reacher. Therapist provided close supervision while standing at sink to perform hand hygiene. Pt left in w/c with all needs met.  Therapy Documentation Precautions:  Precautions Precautions: Fall Restrictions Weight Bearing Restrictions: No General:   Vital Signs: Therapy Vitals Temp: (!) 97.5 F (36.4 C) Temp Source: Oral Pulse Rate: 73 Resp: 16 BP: (!) 98/50 Patient Position (if appropriate): Lying Oxygen Therapy SpO2: 93 % O2 Device: Not Delivered  See Function Navigator for Current Functional Status.   Therapy/Group: Individual Therapy  Hoyt Koch 08/12/2017, 8:44 AM

## 2017-08-12 NOTE — Progress Notes (Signed)
Patient arrived to unit per bed.  Reviewed treatment plan and this RN agrees.  Report received from bedside RN, Velta Addison.  Consent verified.  Patient A & o X 4. Lung sounds diminished to ausculation in all fields. BLE edema. Cardiac: NSR.  Prepped RUAVF with alcohol and cannulated with two 15 gauge needles.  Pulsation of blood noted.  Flushed access well with saline per protocol.  Connected and secured lines and initiated tx at 1613.  UF goal of 3500 mL and net fluid removal of 3000 mL.  Will continue to monitor.

## 2017-08-12 NOTE — Progress Notes (Signed)
pt consistently refuses cpap. RT to monitor and assess as needed.

## 2017-08-12 NOTE — Progress Notes (Signed)
Occupational Therapy Session Note  Patient Details  Name: [name] MRN: [id number] Date of Birth: [birth year]  Today's Date: 08/12/2017   1100-1136 36 min  OT Missed Time: 26 Minutes Missed Time Reason: Other (comment) (interventional radiology)   Short Term Goals: Week 1:  OT Short Term Goal 1 (Week 1): Pt will complete bathing with min assist at sit > stand level OT Short Term Goal 1 - Progress (Week 1): Met OT Short Term Goal 2 (Week 1): Pt will complete LB dressing with mod assist at sit > stand level OT Short Term Goal 2 - Progress (Week 1): Met OT Short Term Goal 3 (Week 1): Pt will complete toilet transfer with min assist  OT Short Term Goal 3 - Progress (Week 1): Met OT Short Term Goal 4 (Week 1): Pt will complete 2 grooming tasks in standing with min assist for increased standing tolerance OT Short Term Goal 4 - Progress (Week 1): Met  Skilled Therapeutic Interventions/Progress Updates:    1:1. RN alerted OT that interventional radiology coming up to take pt to procedure at beginning of session. Pt requires encouragement to participate in tx as pt reporting fatigue from previous sessions. Pt stand pivot transfers with VC for safety awareness. Pt doffs shirt and dons hospital gown with supervision. Pt brushes teeth with set up. Pt completes 1x10 of BUE therex with 3# dumbells in all planes of motion with min instructional cues to improve BUE strength required for BADLs. Pt requires significnat rest breaks in between sets 2/2 fatigue. Exited session with direct handoff to IR transport specialist. Pt missed 24 min skilled OT.  Therapy Documentation Precautions:  Precautions Precautions: Fall Restrictions Weight Bearing Restrictions: No  See Function Navigator for Current Functional Status.   Therapy/Group: Individual Therapy  Tonny Branch 08/12/2017, 11:47 AM

## 2017-08-12 NOTE — Progress Notes (Signed)
Occupational Therapy Note  Patient Details  Name: [name] MRN: [id number] Date of Birth: 07/26/1956  Attempted to make up missed 24 min with pt this afternoon, however pt declined 2/2 fatigue and reporting he is about to go to dialysis. Will follow up as available.    Lowella Dell Samaj Wessells 08/12/2017, 3:26 PM

## 2017-08-13 ENCOUNTER — Inpatient Hospital Stay (HOSPITAL_COMMUNITY): Payer: Medicare Other

## 2017-08-13 ENCOUNTER — Inpatient Hospital Stay (HOSPITAL_COMMUNITY): Payer: Medicare Other | Admitting: Physical Therapy

## 2017-08-13 LAB — RENAL FUNCTION PANEL
Albumin: 1.9 g/dL — ABNORMAL LOW (ref 3.5–5.0)
Anion gap: 10 (ref 5–15)
BUN: 25 mg/dL — AB (ref 6–20)
CALCIUM: 8.2 mg/dL — AB (ref 8.9–10.3)
CHLORIDE: 94 mmol/L — AB (ref 101–111)
CO2: 26 mmol/L (ref 22–32)
CREATININE: 2.66 mg/dL — AB (ref 0.61–1.24)
GFR calc non Af Amer: 24 mL/min — ABNORMAL LOW (ref >60)
GFR, EST AFRICAN AMERICAN: 28 mL/min — AB (ref >60)
GLUCOSE: 221 mg/dL — AB (ref 65–99)
Phosphorus: 1.9 mg/dL — ABNORMAL LOW (ref 2.5–4.6)
Potassium: 3.2 mmol/L — ABNORMAL LOW (ref 3.5–5.1)
SODIUM: 130 mmol/L — AB (ref 135–145)

## 2017-08-13 LAB — CBC
HCT: 29.6 % — ABNORMAL LOW (ref 39.0–52.0)
Hemoglobin: 9 g/dL — ABNORMAL LOW (ref 13.0–17.0)
MCH: 30.2 pg (ref 26.0–34.0)
MCHC: 30.4 g/dL (ref 30.0–36.0)
MCV: 99.3 fL (ref 78.0–100.0)
PLATELETS: 110 10*3/uL — AB (ref 150–400)
RBC: 2.98 MIL/uL — ABNORMAL LOW (ref 4.22–5.81)
RDW: 20.5 % — AB (ref 11.5–15.5)
WBC: 9.6 10*3/uL (ref 4.0–10.5)

## 2017-08-13 LAB — GLUCOSE, CAPILLARY
GLUCOSE-CAPILLARY: 152 mg/dL — AB (ref 65–99)
GLUCOSE-CAPILLARY: 185 mg/dL — AB (ref 65–99)
GLUCOSE-CAPILLARY: 198 mg/dL — AB (ref 65–99)
GLUCOSE-CAPILLARY: 121 mg/dL — AB (ref 65–99)
Glucose-Capillary: 120 mg/dL — ABNORMAL HIGH (ref 65–99)

## 2017-08-13 LAB — PROTIME-INR
INR: 2.82
PROTHROMBIN TIME: 29.4 s — AB (ref 11.4–15.2)

## 2017-08-13 MED ORDER — HEPARIN SODIUM (PORCINE) 1000 UNIT/ML DIALYSIS: 20.0000 [IU]/kg | INTRAMUSCULAR | Status: DC | PRN

## 2017-08-13 MED ORDER — SODIUM CHLORIDE 0.9 % IV SOLN: 100.0000 mL | INTRAVENOUS | Status: DC | PRN

## 2017-08-13 MED ORDER — LIDOCAINE-PRILOCAINE 2.5-2.5 % EX CREA: 1.0000 "application " | TOPICAL_CREAM | CUTANEOUS | Status: DC | PRN

## 2017-08-13 MED ORDER — LIDOCAINE HCL (PF) 1 % IJ SOLN: 5.0000 mL | INTRAMUSCULAR | Status: DC | PRN

## 2017-08-13 MED ORDER — ALTEPLASE 2 MG IJ SOLR: 2.0000 mg | Freq: Once | INTRAMUSCULAR | Status: DC | PRN

## 2017-08-13 MED ORDER — MIDODRINE HCL 5 MG PO TABS
ORAL_TABLET | ORAL | Status: AC
  Filled 2017-08-13: qty 2

## 2017-08-13 MED ORDER — WARFARIN 0.5 MG HALF TABLET
0.5000 mg | ORAL_TABLET | Freq: Once | ORAL | Status: AC
  Administered 2017-08-13: 0.5 mg via ORAL
  Filled 2017-08-13: qty 1

## 2017-08-13 MED ORDER — HEPARIN SODIUM (PORCINE) 1000 UNIT/ML DIALYSIS: 1000.0000 [IU] | INTRAMUSCULAR | Status: DC | PRN

## 2017-08-13 MED ORDER — PENTAFLUOROPROP-TETRAFLUOROETH EX AERO: 1.0000 "application " | INHALATION_SPRAY | CUTANEOUS | Status: DC | PRN

## 2017-08-13 NOTE — Progress Notes (Signed)
Occupational Therapy Session Note  Patient Details  Name: [name] MRN: [id number] Date of Birth: May 08, 1956  Today's Date: 08/13/2017 OT Individual Time: 0700-0800 and [id number] OT Individual Time Calculation (min): 60 min and 25 min   Skilled Therapeutic Interventions/Progress Updates:    1:1. Pt supine<>sitting EOB with supervision and tacile cues for sequencing. Pt eats EOB with MOD I and engages in discussion of d/c planning, home set up and shower set up. Pt stand pivot transfers EOB<> w/c with CGA and VC for safety awarness. Pt bathes at sit to stand level with CGA for washing buttocks. Pt threads BLE into pant legs using reacher with Vc for technique and advances pants past hips with CGA. Pt dons tshirt in sitting with supervision. Exited session with pt seated in bed with call light in reach and all needs in reach.   Session 2: Pt supine>sitting EOB with supervision and VC for sequencing transition using bed rails. Pt stand pivot transfer with RW EOB>w/c with CGA and Vc for safety awareness. Pt plays game of wii bowling completing sit to stand at beginning of every frame with VC for safety awareness and posture with CGA fading to supervision. Exited session with pt seated in w/c with call light in reach and all needs met  Therapy Documentation Precautions:  Precautions Precautions: Fall Restrictions Weight Bearing Restrictions: No  See Function Navigator for Current Functional Status.   Therapy/Group: Individual Therapy  Tonny Branch 08/13/2017, 9:07 AM

## 2017-08-13 NOTE — Procedures (Signed)
Tol HD Trying for volume with back to back treatments today.  He is nervous about this. No hemodynamic instability at this time. Kayvan Hoefling C

## 2017-08-13 NOTE — Progress Notes (Signed)
Pt has refused cpap at this time. RT will monitor. 

## 2017-08-13 NOTE — Progress Notes (Signed)
ANTICOAGULATION/ANTIBIOTIC CONSULT NOTE - Follow Up Consult  Pharmacy Consult for warfarin and Cefazolin Indication: atrial fibrillation and MSSA bacteremia  No Known Allergies  Patient Measurements: Height: 5\' 10"  (177.8 cm) Weight: 182 lb 15.7 oz (83 kg) IBW/kg (Calculated) : 73  Vital Signs: Temp: 97.5 F (36.4 C) (09/08 0550) BP: 104/53 (09/08 0550) Pulse Rate: 70 (09/08 0550)  Labs:  Recent Labs  08/10/17 1530 08/10/17 2200 08/11/17 0439 08/12/17 0428 08/12/17 1600 08/13/17 0549  HGB 8.9*  --   --   --  9.2*  --   HCT 28.9*  --   --   --  28.8*  --   PLT 110*  --   --   --  132*  --   LABPROT  --   --  27.2* 30.2*  --  29.4*  INR  --   --  2.55 2.92  --  2.82  CREATININE 3.87* 2.11*  --   --  3.86*  --     Estimated Creatinine Clearance: 20.5 mL/min (A) (by C-G formula based on SCr of 3.86 mg/dL (H)).   Assessment: 60yom continues on coumadin for afib, appears to be very sensitive to dose adjustments. INR is therapeutic at 2.82 today. CBC stable. No signs or symptoms of bleeding noted. Dietary intake variable.   PTA dose: 2mg  daily except 1mg  Tue/Thur  He also continues on cefazolin for MSSA bacteremia. TEE 8/14 without evidence of endocarditis or vegetation on valves/ICD. ICD unable to be removed, so keeping cefazolin dose at 2g IV q24h. Planning for 6 weeks of total cefazolin (until 9/16), then suppressive cephalexin. Patient is now on intermittent dialysis on MWF schedule.  Vanc 8/4 x1 Zosyn 8/4>> 8/5 Cefazolin 8/5 >>(9/16) Rifampin 8/5 >>8/8 (d/c'd d/t hyperbilirubinemia)   8/4 bld x2: MSSA in 2/2 8/4 urine: NG 8/5 MRSA PCR: neg 8/6 bld x2>ng   Goal of Therapy:  INR 2-3 Monitor platelets by anticoagulation protocol: Yes   Plan:  1) Warfarin 0.5 mg PO x 1 Daily INR, CBCs every 3 days. F/u s/sx bleeding, PO intake, DDI 2) Continue cefazolin 2g IV q24 Monitor clinic progress, WBC, TMax, renal function, electrolytes  Nida Boatman,  PharmD PGY1 Acute Care Pharmacy Resident Pager: [phone] 08/13/2017 8:42 AM

## 2017-08-13 NOTE — Progress Notes (Signed)
Physical Therapy Session Note  Patient Details  Name: [name] MRN: [id number] Date of Birth: 14-Jul-1956  Today's Date: 08/13/2017 PT Individual Time: 0950-1030 PT Individual Time Calculation (min): 40 min    Skilled Therapeutic Interventions/Progress Updates:    Session initiated with pt lying in bed reporting that he is very tired from therapy yesterday and having dialysis late last night.  Pt reports 6.5/10 pain in R hip and states that he just had meds.  Session focused on promoting independence, safety,  and endurance for functional mobility. Pt performed 5 reps of L LE heel slides in bed and b/l ankle pumps x 10 each.  Pt performed supine to sit with HOB elevated with min assist for R LE.  With bed significantly elevated pt perform sit to stand x 5 reps to rolling walker.  Pt then ambulated to chair from bed with min assist using RW.  Pt then performed sit to stand to RW for 1 x 5 and ambulated 30 ft with RW ( to door and back in room).  During rest breaks, pt given education on importance of pacing activity to maximize endurance improvements upon D/C home.  Both pt and wife verbalized understanding.  Pt then ambulated back to w/c where pt was left with needs met and wife and friend in room.    Therapy Documentation Precautions:  Precautions Precautions: Fall Restrictions Weight Bearing Restrictions: No General: PT Amount of Missed Time (min):  (20) PT Missed Treatment Reason: Patient fatigue   See Function Navigator for Current Functional Status.   Therapy/Group: Individual Therapy  Yitzhak Awan Hilario Quarry 08/13/2017, 12:25 PM

## 2017-08-13 NOTE — Progress Notes (Signed)
Patient ID: [name], male   DOB: 1956/02/21, 61 y.o.   MRN: [id number]   08/13/17.  [name] is a 61 y.o. male  Admit for CIR with   Severe debilitation/encephalopathy secondary to respiratory failure, CHF, MSSA sepsis      Past Medical History:  Diagnosis Date  . Adenocarcinoma of rectum ([place])    a. 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 AICD (automatic cardioverter/defibrillator) present 2002   BI V ICD  . Anemia   . CAD (coronary artery disease)    a. BMS to LAD 2001 at [facility] b. PTCA/atherectomy ramus and BMS to LAD 2009  . CHF (congestive heart failure) ([place])   . Cholelithiasis 06/2015  . Chronic kidney disease, stage IV (severe) ([place])   . Chronic systolic heart failure ([place])   . Cirrhosis ([place])   . Colostomy in place [facility])   . Dizziness    a. chronic. Admission for this 07/18/2014  . DM type 2, uncontrolled, with renal complications ([place])   . Dysrhythmia   . Essential hypertension, benign   . GERD (gastroesophageal reflux disease)   . HCAP (healthcare-associated pneumonia) 07/21/2015  . Hematuria   . History of blood transfusion    "I've had 2 units so far this year" (09/27/2015)  . HLD (hyperlipidemia)   . Ischemic cardiomyopathy    EF 18% by nuclear study 2016, multiple myocardial infarctions in past    . Myocardial infarction ([place]) 2001  . Obesity   . Orthostatic hypotension   . OSA on CPAP   . Paroxysmal atrial fibrillation ([facility])    a. on amiodarone, digoxin and Eliquis  . PONV (postoperative nausea and vomiting)   . Prostate cancer ([place])    a. s/p seed implants with chemo and radiation  . SAH (subarachnoid hemorrhage) ([place])    post-traumatic (fall) [facility] 12/2014  . TIA (transient ischemic attack)      Subjective: No new complaints. No new problems. Slept well. HD yesterday  Objective: Vital signs in last 24 hours: Temp:  [97.5 F (36.4 C)-98 F (36.7 C)] 97.5 F (36.4 C) (09/08 0550) Pulse Rate:  [66-77] 70 (09/08 0550) Resp:  [16-18] 16 (09/07 2100) BP:  (90-113)/(46-65) 104/53 (09/08 0550) SpO2:  [98 %-100 %] 100 % (09/07 2100) Weight:  [182 lb 15.7 oz (83 kg)-189 lb 9.5 oz (86 kg)] 182 lb 15.7 oz (83 kg) (09/08 0550) Weight change: 6 lb 2.8 oz (2.8 kg) Last BM Date: 08/12/17  Intake/Output from previous day: 09/07 0701 - 09/08 0700 In: 640 [P.O.:540; IV Piggyback:100] Out: 3250 [Urine:250] Last cbgs: CBG (last 3)   Recent Labs  08/12/17 1124 08/12/17 2103 08/13/17 0642  GLUCAP 143* 104* 152*   Lab Results  Component Value Date   INR 2.82 08/13/2017   INR 2.92 08/12/2017   INR 2.55 08/11/2017    Physical Exam General: No apparent distress   HEENT: not dry Lungs: Normal effort. Lungs clear to auscultation, no crackles or wheezes. Cardiovascular: Regular rate and rhythm, no edema Abdomen: S/NT/ND; BS(+) s/p colostomy Musculoskeletal:  unchanged Neurological: No new neurological deficits Extremities- HD access RUE; IVF L arm; LE bandaged Skin: multiple wounds  Mental state: Alert, oriented, cooperative    Lab Results: BMET    Component Value Date/Time   NA 127 (L) 08/12/2017 1600   K 4.0 08/12/2017 1600   CL 91 (L) 08/12/2017 1600   CO2 24 08/12/2017 1600   GLUCOSE 119 (H) 08/12/2017 1600   BUN 42 (H) 08/12/2017 1600   CREATININE  3.86 (H) 08/12/2017 1600   CREATININE 2.67 (H) 03/18/2016 0917   CALCIUM 8.7 (L) 08/12/2017 1600   CALCIUM 7.9 (L) 07/12/2017 0723   GFRNONAA 15 (L) 08/12/2017 1600   GFRAA 18 (L) 08/12/2017 1600   CBC    Component Value Date/Time   WBC 9.6 08/12/2017 1600   RBC 2.96 (L) 08/12/2017 1600   HGB 9.2 (L) 08/12/2017 1600   HGB 14.6 05/02/2012 0852   HCT 28.8 (L) 08/12/2017 1600   HCT 23.6 (L) 04/24/2015 1116   HCT 43.6 05/02/2012 0852   PLT 132 (L) 08/12/2017 1600   PLT 163 05/02/2012 0852   MCV 97.3 08/12/2017 1600   MCV 93.3 05/02/2012 0852   MCH 31.1 08/12/2017 1600   MCHC 31.9 08/12/2017 1600   RDW 20.5 (H) 08/12/2017 1600   RDW 14.1 05/02/2012 0852   LYMPHSABS 0.6 (L)  08/08/2017 1312   LYMPHSABS 1.2 05/02/2012 0852   MONOABS 1.2 (H) 08/08/2017 1312   MONOABS 0.6 05/02/2012 0852   EOSABS 0.1 08/08/2017 1312   EOSABS 0.0 05/02/2012 0852   BASOSABS 0.0 08/08/2017 1312   BASOSABS 0.0 05/02/2012 0852    Studies/Results: Ir Removal Tun Cv Cath W/o Fl  Result Date: 08/12/2017 INDICATION: Tunneled PICC no longer required.  Patient presents for removal. EXAM: REMOVAL OF TUNNELED CENTRAL VENOUS CATHETER MEDICATIONS: None ANESTHESIA/SEDATION: None FLUOROSCOPY TIME:  None COMPLICATIONS: None immediate. PROCEDURE: The tunneled PICC catheter site was prepped with chlorhexidine. A sterile gown and gloves were worn during the procedure. Local anesthesia was provided with 1% lidocaine. Utilizing sharp and blunt dissection, the subcutaneous cuff of the dialysis catheter was freed. The catheter was then successfully removed in its entirety. A sterile dressing was applied over the catheter exit site. IMPRESSION: Removal of tunneled PICC utilizing sharp and blunt dissection. Electronically Signed   By: Jacqulynn Cadet M.D.   On: 08/12/2017 16:52    Medications: I have reviewed the patient's current medications.  Assessment/Plan:  Severe debilitation/encephalopathy secondary to respiratory failure, CHF, MSSA sepsis              Cont CIR CKD. followup Renal; continue HD Multiple wounds.  Continue aggressive wound care MSSA bacteremia. Continue Ancef DM. Nice glycemic control     Length of stay, days: [place] , MD 08/13/2017, 11:07 AM

## 2017-08-14 ENCOUNTER — Inpatient Hospital Stay (HOSPITAL_COMMUNITY): Payer: Medicare Other

## 2017-08-14 LAB — PROTIME-INR
INR: 2.55
Prothrombin Time: 27.2 seconds — ABNORMAL HIGH (ref 11.4–15.2)

## 2017-08-14 LAB — GLUCOSE, CAPILLARY
GLUCOSE-CAPILLARY: 113 mg/dL — AB (ref 65–99)
GLUCOSE-CAPILLARY: 117 mg/dL — AB (ref 65–99)
GLUCOSE-CAPILLARY: 226 mg/dL — AB (ref 65–99)
Glucose-Capillary: 174 mg/dL — ABNORMAL HIGH (ref 65–99)

## 2017-08-14 MED ORDER — WARFARIN SODIUM 1 MG PO TABS
1.0000 mg | ORAL_TABLET | Freq: Once | ORAL | Status: AC
  Administered 2017-08-14: 1 mg via ORAL
  Filled 2017-08-14: qty 1

## 2017-08-14 NOTE — Progress Notes (Signed)
PT HAS REFDUSED CPAP AT THIS TIME. RT WILL MONITOR.

## 2017-08-14 NOTE — Progress Notes (Signed)
Occupational Therapy Session Note  Patient Details  Name: [name] MRN: [id number] Date of Birth: 1956/02/18  Today's Date: 08/14/2017 OT Missed Time: 11 Minutes Missed Time Reason: Patient fatigue  Pt missed 60 min of skilled OT d/t fatigue. Pt refusing all OOB/EOB activities reporting, "Im too tired, two days in a row of dialysis is too much." OT encouraged pt and educated on importance of participating in tx. Will follow up as available.   Therapy Documentation Precautions:  Precautions Precautions: Fall Restrictions Weight Bearing Restrictions: No General: General OT Amount of Missed Time: 60 Minutes   Therapy/Group: Individual Therapy  Tonny Branch 08/14/2017, 7:15 AM

## 2017-08-14 NOTE — Progress Notes (Signed)
Occupational Therapy Session Note  Patient Details  Name: [name] MRN: [id number] Date of Birth: 1956/08/08  Today's Date: 08/14/2017 OT Individual Time: 1330-1413 OT Individual Time Calculation (min): 43 min    Short Term Goals: Week 2:  OT Short Term Goal 1 (Week 2): STG = LTG due to remaining LOS  Skilled Therapeutic Interventions/Progress Updates:    1;1. Pt with family present upon entering. Pt wiling to have grandson participate in tx. Pt completes 2x30 passes of basketball in standing with supervision and no LOB (chest, bounce and overhead pass) and Vc for posture and hip extension. Pt requesting to go on nustep. Pt completes 6 min on [place] with rest break at 3 min with VC for pursed lip breathing when pt reports mild shortness of breath. O2 sats measured and in normal ranges. Pt reporting SOB is resolved. In tx gym, pt stands to reach for horseshoes in mod ranges outside BOS with CGA for sit to stand from low surface and tacile cues for posture. Exited session with pt seated EOB with famly in room to eat meal with pt and bed exit alarm on.  Therapy Documentation Precautions:  Precautions Precautions: Fall Restrictions Weight Bearing Restrictions: No  See Function Navigator for Current Functional Status.   Therapy/Group: Individual Therapy  Tonny Branch 08/14/2017, 1:53 PM

## 2017-08-14 NOTE — Progress Notes (Signed)
Patient ID: [name], male   DOB: 12/24/1955, 61 y.o.   MRN: [id number]   08/14/17.  [name] is a 61 y.o. male Admit for CIR with   Severe debilitation/encephalopathy secondary to respiratory failure, CHF, MSSA sepsis   Past Medical History:  Diagnosis Date  . Adenocarcinoma of rectum ([place])    a. 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 AICD (automatic cardioverter/defibrillator) present 2002   BI V ICD  . Anemia   . CAD (coronary artery disease)    a. BMS to LAD 2001 at [facility] b. PTCA/atherectomy ramus and BMS to LAD 2009  . CHF (congestive heart failure) ([place])   . Cholelithiasis 06/2015  . Chronic kidney disease, stage IV (severe) ([place])   . Chronic systolic heart failure ([place])   . Cirrhosis ([place])   . Colostomy in place [facility])   . Dizziness    a. chronic. Admission for this 07/18/2014  . DM type 2, uncontrolled, with renal complications ([place])   . Dysrhythmia   . Essential hypertension, benign   . GERD (gastroesophageal reflux disease)   . HCAP (healthcare-associated pneumonia) 07/21/2015  . Hematuria   . History of blood transfusion    "I've had 2 units so far this year" (09/27/2015)  . HLD (hyperlipidemia)   . Ischemic cardiomyopathy    EF 18% by nuclear study 2016, multiple myocardial infarctions in past    . Myocardial infarction ([place]) 2001  . Obesity   . Orthostatic hypotension   . OSA on CPAP   . Paroxysmal atrial fibrillation ([facility])    a. on amiodarone, digoxin and Eliquis  . PONV (postoperative nausea and vomiting)   . Prostate cancer ([employer])    a. s/p seed implants with chemo and radiation  . SAH (subarachnoid hemorrhage) ([place])    post-traumatic (fall) [facility] 12/2014  . TIA (transient ischemic attack)      Subjective: No new complaints. No new problems. MWF  HD  Objective: Vital signs in last 24 hours: Temp:  [97.6 F (36.4 C)-98.3 F (36.8 C)] 98.3 F (36.8 C) (09/09 0300) Pulse Rate:  [69-77] 76 (09/09 0300) Resp:  [12-18] 12 (09/09 0300) BP: (85-106)/(43-57) 105/57  (09/09 0300) SpO2:  [93 %-99 %] 98 % (09/09 0300) Weight:  [182 lb 15.7 oz (83 kg)-191 lb 12.8 oz (87 kg)] 182 lb 15.7 oz (83 kg) (09/09 0645) Weight change: 2 lb 3.3 oz (1 kg) Last BM Date: 08/13/17 (per pt)  Intake/Output from previous day: 09/08 0701 - 09/09 0700 In: 600 [P.O.:600] Out: 3700 [Urine:200] Last cbgs: CBG (last 3)   Recent Labs  08/13/17 1935 08/13/17 2218 08/14/17 0632  GLUCAP 121* 120* 113*   BP Readings from Last 3 Encounters:  08/14/17 (!) 105/57  08/02/17 102/60  06/21/17 (!) 100/50    Physical Exam General: No apparent distress   HEENT: not dry Lungs: Normal effort. Lungs clear to auscultation, no crackles or wheezes. Cardiovascular: Regular rate and rhythm, no edema Abdomen: S/NT/ND; BS(+) Musculoskeletal:  unchanged Neurological: No new neurological deficits Extremities- HD access RUE Skin: LE bandaged  Mental state: Alert, oriented, cooperative    Lab Results: BMET    Component Value Date/Time   NA 130 (L) 08/13/2017 1429   K 3.2 (L) 08/13/2017 1429   CL 94 (L) 08/13/2017 1429   CO2 26 08/13/2017 1429   GLUCOSE 221 (H) 08/13/2017 1429   BUN 25 (H) 08/13/2017 1429   CREATININE 2.66 (H) 08/13/2017 1429   CREATININE 2.67 (H) 03/18/2016 0917   CALCIUM 8.2 (  L) 08/13/2017 1429   CALCIUM 7.9 (L) 07/12/2017 0723   GFRNONAA 24 (L) 08/13/2017 1429   GFRAA 28 (L) 08/13/2017 1429   CBC    Component Value Date/Time   WBC 9.6 08/13/2017 1430   RBC 2.98 (L) 08/13/2017 1430   HGB 9.0 (L) 08/13/2017 1430   HGB 14.6 05/02/2012 0852   HCT 29.6 (L) 08/13/2017 1430   HCT 23.6 (L) 04/24/2015 1116   HCT 43.6 05/02/2012 0852   PLT 110 (L) 08/13/2017 1430   PLT 163 05/02/2012 0852   MCV 99.3 08/13/2017 1430   MCV 93.3 05/02/2012 0852   MCH 30.2 08/13/2017 1430   MCHC 30.4 08/13/2017 1430   RDW 20.5 (H) 08/13/2017 1430   RDW 14.1 05/02/2012 0852   LYMPHSABS 0.6 (L) 08/08/2017 1312   LYMPHSABS 1.2 05/02/2012 0852   MONOABS 1.2 (H) 08/08/2017  1312   MONOABS 0.6 05/02/2012 0852   EOSABS 0.1 08/08/2017 1312   EOSABS 0.0 05/02/2012 0852   BASOSABS 0.0 08/08/2017 1312   BASOSABS 0.0 05/02/2012 0852    Studies/Results: Ir Removal Tun Cv Cath W/o Fl  Result Date: 08/12/2017 INDICATION: Tunneled PICC no longer required.  Patient presents for removal. EXAM: REMOVAL OF TUNNELED CENTRAL VENOUS CATHETER MEDICATIONS: None ANESTHESIA/SEDATION: None FLUOROSCOPY TIME:  None COMPLICATIONS: None immediate. PROCEDURE: The tunneled PICC catheter site was prepped with chlorhexidine. A sterile gown and gloves were worn during the procedure. Local anesthesia was provided with 1% lidocaine. Utilizing sharp and blunt dissection, the subcutaneous cuff of the dialysis catheter was freed. The catheter was then successfully removed in its entirety. A sterile dressing was applied over the catheter exit site. IMPRESSION: Removal of tunneled PICC utilizing sharp and blunt dissection. Electronically Signed   By: Jacqulynn Cadet M.D.   On: 08/12/2017 16:52    Medications: I have reviewed the patient's current medications.  Assessment/Plan:   Severe debilitation/encephalopathy secondary to respiratory failure, CHF, MSSA sepsis  Cont CIR CKD. followup Renal; continue HD MWF Multiple wounds.  Continue aggressive wound care MSSA bacteremia. Continue Ancef DM. Nice glycemic control HTN- well controlled    Length of stay, days: [place] , MD 08/14/2017, 10:33 AM

## 2017-08-14 NOTE — Progress Notes (Signed)
ANTICOAGULATION CONSULT NOTE - Follow Up Consult  Pharmacy Consult for warfarin Indication: atrial fibrillation  No Known Allergies  Patient Measurements: Height: 5\' 10"  (177.8 cm) Weight: 182 lb 15.7 oz (83 kg) IBW/kg (Calculated) : 73  Vital Signs: Temp: 98.3 F (36.8 C) (09/09 0300) Temp Source: Oral (09/09 0300) BP: 105/57 (09/09 0300) Pulse Rate: 76 (09/09 0300)  Labs:  Recent Labs  08/12/17 0428 08/12/17 1600 08/13/17 0549 08/13/17 1429 08/13/17 1430 08/14/17 0737  HGB  --  9.2*  --   --  9.0*  --   HCT  --  28.8*  --   --  29.6*  --   PLT  --  132*  --   --  110*  --   LABPROT 30.2*  --  29.4*  --   --  27.2*  INR 2.92  --  2.82  --   --  2.55  CREATININE  --  3.86*  --  2.66*  --   --     Estimated Creatinine Clearance: 29.7 mL/min (A) (by C-G formula based on SCr of 2.66 mg/dL (H)).  Assessment: 61yo M continues on warfarin for afib, appears to be very sensitive to dose adjustments. INR is therapeutic at 2.55 today. CBC stable. No signs or symptoms of bleeding noted. Dietary intake 100% yesterday.  PTA dose: 2mg  daily except 1mg  Tue/Thur  Goal of Therapy:  INR 2-3 Monitor platelets by anticoagulation protocol: Yes   Plan:  Warfarin 1 mg PO x 1 Daily INR, CBCs every 3 days F/u s/sx bleeding, PO intake, DDI  Nida Boatman, PharmD PGY1 Acute Care Pharmacy Resident Pager: [phone] 9:27 AM 08/14/2017

## 2017-08-14 NOTE — Progress Notes (Signed)
Assessment/ Plan:   1. Staphylococcus bacteremia with septic/cardiogenic shock: on Ancef. 2. ESRD: Continue hemodialysis on a Monday/Wednesday/Friday schedule via right brachiocephalic fistula. Process underway for outpatient dialysis unit placement to [facility] (he was previously a patient there).  Need more aggressive dialysis.  Will do daily as tol by BP 3. Deconditioning: rehabilitation in CIR  Subjective: Interval History: 3.5 liters of fluid off with HD yesterday  Objective: Vital signs in last 24 hours: Temp:  [97.6 F (36.4 C)-98.3 F (36.8 C)] 97.9 F (36.6 C) (09/09 1536) Pulse Rate:  [51-76] 51 (09/09 1536) Resp:  [12-18] 18 (09/09 1536) BP: (82-105)/(41-57) 82/41 (09/09 1536) SpO2:  [94 %-99 %] 98 % (09/09 1536) Weight:  [83 kg (182 lb 15.7 oz)-84 kg (185 lb 3 oz)] 83 kg (182 lb 15.7 oz) (09/09 0645) Weight change: 1 kg (2 lb 3.3 oz)  Intake/Output from previous day: 09/08 0701 - 09/09 0700 In: 600 [P.O.:600] Out: 3700 [Urine:200] Intake/Output this shift: No intake/output data recorded.  General appearance: alert and cooperative Back: negative, symmetric, no curvature. ROM normal. No CVA tenderness., 2+ pitting Chest wall: no tenderness Breasts: normal appearance, no masses or tenderness Cardio: regular rate and rhythm, S1, S2 normal, no murmur, click, rub or gallop GI: distended and firm r>l  Edema bilat LE 2+  Lab Results:  Recent Labs  08/12/17 1600 08/13/17 1430  WBC 9.6 9.6  HGB 9.2* 9.0*  HCT 28.8* 29.6*  PLT 132* 110*   BMET:  Recent Labs  08/12/17 1600 08/13/17 1429  NA 127* 130*  K 4.0 3.2*  CL 91* 94*  CO2 24 26  GLUCOSE 119* 221*  BUN 42* 25*  CREATININE 3.86* 2.66*  CALCIUM 8.7* 8.2*   No results for input(s): PTH in the last 72 hours. Iron Studies: No results for input(s): IRON, TIBC, TRANSFERRIN, FERRITIN in the last 72 hours. Studies/Results: No results found.  Scheduled: . colchicine  0.3 mg Oral Daily  .  collagenase  1 application Topical Daily  . darbepoetin (ARANESP) injection - NON-DIALYSIS  200 mcg Subcutaneous Q Wed-1800  . feeding supplement (ENSURE ENLIVE)  237 mL Oral Q1500  . feeding supplement (PRO-STAT SUGAR FREE 64)  30 mL Oral BID  . furosemide  160 mg Oral Daily  . hydrocerin   Topical BID  . insulin aspart  0-5 Units Subcutaneous QHS  . insulin aspart  3 Units Subcutaneous TID WC  . insulin glargine  8 Units Subcutaneous QHS  . loratadine  10 mg Oral Daily  . midodrine  10 mg Oral TID WC  . multivitamin  1 tablet Oral QHS  . sucroferric oxyhydroxide  500 mg Oral TID WC  . warfarin  1 mg Oral ONCE-1800  . Warfarin - Pharmacist Dosing Inpatient   Does not apply q1800     LOS: 12 days   Genelle Economou C 08/14/2017,3:57 PM

## 2017-08-14 NOTE — Progress Notes (Signed)
Occupational Therapy Session Note  Patient Details  Name: [name] MRN: [id number] Date of Birth: Oct 10, 1956  Today's Date: 08/14/2017 OT Individual Time: 7680-8811 OT Individual Time Calculation (min): 27 min    Skilled Therapeutic Interventions/Progress Updates:    1;1. Focus of session on bathing and dressing at sink level. Pt stand pivot transfer to R with CGA and L with supervision EOB>w/c. pt bathe at sit to stand level with supervision. OT challenges pt to brush teeth and don shirt in standing with supervision to challenge balance. Educated pt on sitting while donning clothing at home to decrease fall risk and conserve energy. Pt dons pants with reacher with supervision. Pt able to recall energy conervation techniques from previous session such as donning both underwear and pants prior to standing up for clothing management. Exited session with pt semireclined in bed with call light in reach and all needs met.  Therapy Documentation Precautions:  Precautions Precautions: Fall Restrictions Weight Bearing Restrictions: No \ See Function Navigator for Current Functional Status.   Therapy/Group: Individual Therapy  Tonny Branch 08/14/2017, 10:15 AM

## 2017-08-15 ENCOUNTER — Inpatient Hospital Stay (HOSPITAL_COMMUNITY): Payer: Medicare Other | Admitting: Physical Therapy

## 2017-08-15 ENCOUNTER — Inpatient Hospital Stay (HOSPITAL_COMMUNITY): Payer: Medicare Other | Admitting: Occupational Therapy

## 2017-08-15 LAB — RENAL FUNCTION PANEL
ALBUMIN: 1.9 g/dL — AB (ref 3.5–5.0)
Anion gap: 10 (ref 5–15)
BUN: 39 mg/dL — AB (ref 6–20)
CALCIUM: 8.6 mg/dL — AB (ref 8.9–10.3)
CHLORIDE: 95 mmol/L — AB (ref 101–111)
CO2: 24 mmol/L (ref 22–32)
CREATININE: 4.03 mg/dL — AB (ref 0.61–1.24)
GFR, EST AFRICAN AMERICAN: 17 mL/min — AB (ref >60)
GFR, EST NON AFRICAN AMERICAN: 15 mL/min — AB (ref >60)
Glucose, Bld: 168 mg/dL — ABNORMAL HIGH (ref 65–99)
Phosphorus: 3.6 mg/dL (ref 2.5–4.6)
Potassium: 3.6 mmol/L (ref 3.5–5.1)
SODIUM: 129 mmol/L — AB (ref 135–145)

## 2017-08-15 LAB — CBC
HCT: 30.5 % — ABNORMAL LOW (ref 39.0–52.0)
Hemoglobin: 9.4 g/dL — ABNORMAL LOW (ref 13.0–17.0)
MCH: 30.3 pg (ref 26.0–34.0)
MCHC: 30.8 g/dL (ref 30.0–36.0)
MCV: 98.4 fL (ref 78.0–100.0)
PLATELETS: 121 10*3/uL — AB (ref 150–400)
RBC: 3.1 MIL/uL — AB (ref 4.22–5.81)
RDW: 21.1 % — AB (ref 11.5–15.5)
WBC: 9.1 10*3/uL (ref 4.0–10.5)

## 2017-08-15 LAB — PROTIME-INR
INR: 2.87
Prothrombin Time: 29.8 seconds — ABNORMAL HIGH (ref 11.4–15.2)

## 2017-08-15 LAB — GLUCOSE, CAPILLARY
GLUCOSE-CAPILLARY: 165 mg/dL — AB (ref 65–99)
GLUCOSE-CAPILLARY: 152 mg/dL — AB (ref 65–99)
Glucose-Capillary: 113 mg/dL — ABNORMAL HIGH (ref 65–99)

## 2017-08-15 MED ORDER — HEPARIN SODIUM (PORCINE) 1000 UNIT/ML DIALYSIS
1000.0000 [IU] | INTRAMUSCULAR | Status: DC | PRN
  Filled 2017-08-15: qty 1

## 2017-08-15 MED ORDER — ALTEPLASE 2 MG IJ SOLR: 2.0000 mg | Freq: Once | INTRAMUSCULAR | Status: DC | PRN

## 2017-08-15 MED ORDER — LIDOCAINE-PRILOCAINE 2.5-2.5 % EX CREA
1.0000 "application " | TOPICAL_CREAM | CUTANEOUS | Status: DC | PRN
  Filled 2017-08-15: qty 5

## 2017-08-15 MED ORDER — WARFARIN SODIUM 1 MG PO TABS
1.0000 mg | ORAL_TABLET | Freq: Once | ORAL | Status: DC
  Filled 2017-08-15: qty 1

## 2017-08-15 MED ORDER — HEPARIN SODIUM (PORCINE) 1000 UNIT/ML DIALYSIS
20.0000 [IU]/kg | INTRAMUSCULAR | Status: DC | PRN
  Administered 2017-08-15: 1700 [IU] via INTRAVENOUS_CENTRAL
  Filled 2017-08-15 (×2): qty 2

## 2017-08-15 MED ORDER — LIDOCAINE HCL (PF) 1 % IJ SOLN
5.0000 mL | INTRAMUSCULAR | Status: DC | PRN
  Filled 2017-08-15: qty 5

## 2017-08-15 MED ORDER — SODIUM CHLORIDE 0.9 % IV SOLN: 100.0000 mL | INTRAVENOUS | Status: DC | PRN

## 2017-08-15 MED ORDER — WARFARIN 0.5 MG HALF TABLET
0.5000 mg | ORAL_TABLET | Freq: Once | ORAL | Status: AC
  Administered 2017-08-15: 0.5 mg via ORAL
  Filled 2017-08-15: qty 1

## 2017-08-15 MED ORDER — LIDOCAINE HCL 4 % EX SOLN
Freq: Once | CUTANEOUS | Status: AC
  Administered 2017-08-15: 12:00:00 via TOPICAL
  Filled 2017-08-15: qty 50

## 2017-08-15 MED ORDER — PENTAFLUOROPROP-TETRAFLUOROETH EX AERO: 1.0000 "application " | INHALATION_SPRAY | CUTANEOUS | Status: DC | PRN

## 2017-08-15 NOTE — Progress Notes (Signed)
Report given to HD, RN prior to transfer informed of B/P 97/59 HR 70,no c/o or distress noted

## 2017-08-15 NOTE — Progress Notes (Signed)
Assessment/ Plan:   1. Staphylococcus bacteremia with septic/cardiogenic shock: on Ancef. 2. ESRD: Continue hemodialysis on a Monday/Wednesday/Friday schedule via right brachiocephalic fistula. Process underway for outpatient dialysis unit placement to [facility] (he was previously a patient there).  Need more aggressive dialysis--> UF 3. Deconditioning: rehabilitation in CIR  Subjective: Interval History: Tolerating dialysis well.    Objective: Vital signs in last 24 hours: Temp:  [97.6 F (36.4 C)] 97.6 F (36.4 C) (09/10 0357) Pulse Rate:  [75] 75 (09/10 0357) Resp:  [18] 18 (09/10 0357) BP: (118)/(64) 118/64 (09/10 0357) SpO2:  [95 %] 95 % (09/10 0357) Weight:  [82 kg (180 lb 12.4 oz)] 82 kg (180 lb 12.4 oz) (09/10 0357) Weight change: -5 kg (-11 lb 0.4 oz)  Intake/Output from previous day: 09/09 0701 - 09/10 0700 In: 840 [P.O.:840] Out: 250 [Urine:250] Intake/Output this shift: Total I/O In: 360 [P.O.:360] Out: -     GEN NAD HEENT EOMI, perrl NECK + JVD PULM clear  CV RRR ABD mildly distended EXT wrapped, 2+ LE edema NEURO AAO x3  Lab Results:  Recent Labs  08/12/17 1600 08/13/17 1430  WBC 9.6 9.6  HGB 9.2* 9.0*  HCT 28.8* 29.6*  PLT 132* 110*   BMET:   Recent Labs  08/12/17 1600 08/13/17 1429  NA 127* 130*  K 4.0 3.2*  CL 91* 94*  CO2 24 26  GLUCOSE 119* 221*  BUN 42* 25*  CREATININE 3.86* 2.66*  CALCIUM 8.7* 8.2*   No results for input(s): PTH in the last 72 hours. Iron Studies: No results for input(s): IRON, TIBC, TRANSFERRIN, FERRITIN in the last 72 hours. Studies/Results: No results found.  Scheduled: . colchicine  0.3 mg Oral Daily  . collagenase  1 application Topical Daily  . darbepoetin (ARANESP) injection - NON-DIALYSIS  200 mcg Subcutaneous Q Wed-1800  . feeding supplement (ENSURE ENLIVE)  237 mL Oral Q1500  . feeding supplement (PRO-STAT SUGAR FREE 64)  30 mL Oral BID  . furosemide  160 mg Oral Daily  . hydrocerin    Topical BID  . insulin aspart  0-5 Units Subcutaneous QHS  . insulin aspart  3 Units Subcutaneous TID WC  . insulin glargine  8 Units Subcutaneous QHS  . loratadine  10 mg Oral Daily  . midodrine  10 mg Oral TID WC  . multivitamin  1 tablet Oral QHS  . sucroferric oxyhydroxide  500 mg Oral TID WC  . warfarin  0.5 mg Oral ONCE-1800  . Warfarin - Pharmacist Dosing Inpatient   Does not apply q1800     LOS: 13 days   [name] 08/15/2017,3:38 PM

## 2017-08-15 NOTE — Progress Notes (Signed)
[place] PHYSICAL MEDICINE & REHABILITATION     PROGRESS NOTE  Subjective/Complaints:  Pt seen sitting up working with OT this AM.  He slept well overnight and had a good weekend.    ROS: Denies CP, SOB, N/V/D.  Objective: Vital Signs: Blood pressure 118/64, pulse 75, temperature 97.6 F (36.4 C), temperature source Axillary, resp. rate 18, height 5\' 10"  (1.778 m), weight 82 kg (180 lb 12.4 oz), SpO2 95 %. No results found.  Recent Labs  08/12/17 1600 08/13/17 1430  WBC 9.6 9.6  HGB 9.2* 9.0*  HCT 28.8* 29.6*  PLT 132* 110*    Recent Labs  08/12/17 1600 08/13/17 1429  NA 127* 130*  K 4.0 3.2*  CL 91* 94*  GLUCOSE 119* 221*  BUN 42* 25*  CREATININE 3.86* 2.66*  CALCIUM 8.7* 8.2*   CBG (last 3)   Recent Labs  08/14/17 2113 08/15/17 0731 08/15/17 1147  GLUCAP 226* 165* 152*    Wt Readings from Last 3 Encounters:  08/15/17 82 kg (180 lb 12.4 oz)  08/02/17 70.2 kg (154 lb 12.2 oz)  06/21/17 79.2 kg (174 lb 8 oz)    Physical Exam:  BP 118/64 (BP Location: Left Arm)   Pulse 75   Temp 97.6 F (36.4 C) (Axillary)   Resp 18   Ht 5\' 10"  (1.778 m)   Wt 82 kg (180 lb 12.4 oz)   SpO2 95%   BMI 25.94 kg/m  Constitutional: No distress. Vital signs reviewed.  HENT: Normocephalic and atraumatic.  Eyes: EOM are normal. No discharge.  Cardiovascular: Cardiac rate controlled. No JVD. Respiratory: Effort normal and breath sounds normal.  GI: Bowel sounds are normal. He exhibits no distension.  Colostomy in place . Musc: No edema. No tenderness Neurological: Alert.  Motor: b/l UE 4/5 deltoid, biceps, triceps, wrist ext, HI (unchanged).  LLE: 4-/5 HF and 4+/5 KE and 5/5 ADF/PF.  [name]: 4-/5 HF, 4/5 KE and 4+/5 ADF/PF. (stable) Skin: Multiple wounds. Sacral ulcer unstageable, not examined today.  Psych: flat, cooperative   Assessment/Plan: 1. Functional deficits secondary to debility which require 3+ hours per day of interdisciplinary therapy in a comprehensive  inpatient rehab setting. Physiatrist is providing close team supervision and 24 hour management of active medical problems listed below. Physiatrist and rehab team continue to assess barriers to discharge/monitor patient progress toward functional and medical goals.  Function:  Bathing Bathing position   Position: Wheelchair/chair at sink  Bathing parts Body parts bathed by patient: Right arm, Left arm, Chest, Abdomen, Front perineal area, Buttocks, Right upper leg, Left upper leg Body parts bathed by helper: Back  Bathing assist Assist Level: Supervision or verbal cues      Upper Body Dressing/Undressing Upper body dressing   What is the patient wearing?: Pull over shirt/dress     Pull over shirt/dress - Perfomed by patient: Thread/unthread right sleeve, Thread/unthread left sleeve, Put head through opening, Pull shirt over trunk          Upper body assist Assist Level: Set up   Set up : To obtain clothing/put away  Lower Body Dressing/Undressing Lower body dressing   What is the patient wearing?: Underwear, Pants Underwear - Performed by patient: Thread/unthread right underwear leg, Thread/unthread left underwear leg, Pull underwear up/down Underwear - Performed by helper: Thread/unthread left underwear leg Pants- Performed by patient: Thread/unthread right pants leg, Thread/unthread left pants leg, Pull pants up/down, Fasten/unfasten pants Pants- Performed by helper: Thread/unthread right pants leg   Non-skid slipper socks-  Performed by helper: Don/doff right sock, Don/doff left sock                  Lower body assist Assist for lower body dressing: Supervision or verbal cues   Set up : To obtain clothing/put away  Toileting Toileting Toileting activity did not occur: No continent bowel/bladder event   Toileting steps completed by helper: Adjust clothing prior to toileting, Performs perineal hygiene, Adjust clothing after toileting (per Elmo Putt, NT report)     Toileting assist     Transfers Chair/bed transfer   Chair/bed transfer method: Ambulatory Chair/bed transfer assist level: Supervision or verbal cues Chair/bed transfer assistive device: Armrests, Medical sales representative     Max distance: 150 Assist level: Supervision or verbal cues   Wheelchair   Type: Manual Max wheelchair distance: 157ft  Assist Level: Supervision or verbal cues  Cognition Comprehension Comprehension assist level: Follows complex conversation/direction with extra time/assistive device  Expression Expression assist level: Expresses basic needs/ideas: With no assist  Social Interaction Social Interaction assist level: Interacts appropriately with others - No medications needed.  Problem Solving Problem solving assist level: Solves basic 75 - 89% of the time/requires cueing 10 - 24% of the time  Memory Memory assist level: Recognizes or recalls 90% of the time/requires cueing < 10% of the time    Medical Problem List and Plan:  1. Severe debilitation/encephalopathy secondary to respiratory failure, CHF, MSSA sepsis   Cont CIR 2. DVT Prophylaxis/Anticoagulation: Chronic Coumadin therapy and monitor for rebleeding episodes   INR therpeutic on 9/10 3. Pain Management: Hydrocodone as needed  4. Mood: Provide emotional support  5. Neuropsych: This patient is capable of making decisions on his own behalf.  6. Skin/Wound Care/lower extremity leg wound: Routine skin checks with skin care as directed   [place] for debridement today 7. Fluids/Electrolytes/Nutrition: Routine I&Os 8. CKD stage IV. Followed closely by renal services.   On dialysis, appreciate nephro recs, aggressively diuresing as tolerated   Cont to monitor 9. MSSA bacteremia. Intravenous cefazolin through 08/21/2017 then chronic Keflex and follow-up per infectious disease  10. 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 Routine colostomy care  11. CAD  with implantable defibrillator. Follow-up cardiology services. No plan to extract device  12. Chronic systolic congestive heart failure/ischemic cardiomyopathy. Monitor for any signs of fluid overload.  Filed Weights   08/13/17 1833 08/14/17 0645 08/15/17 0357  Weight: 84 kg (185 lb 3 oz) 83 kg (182 lb 15.7 oz) 82 kg (180 lb 12.4 oz)  13. Diabetes mellitus and peripheral neuropathy.   Lantus insulin 10 decreased to 6U due to hypoglycemia on 9/1, Increased to 8 units on 9/2  Labile, but relatively controlled 9/10  Will consider further adjustments if necessary 14. Orthostasis. Continue ProAmatine 10 mg 3 times a day.  Controlled 15. Acute on chronic anemia. Continue Aranesp.   Hb 9.0 on 9/8  Labs with HD 16. History of gout. Colchicine 0.3 mg daily. Monitor for any gout flareups  17. Malnourishment  Supplement initiated 8/29 18. Thrombocytopenia  Plts 110 on 9/8  Labs with HD  Cont to monitor  LOS (Days) 13 A FACE TO FACE EVALUATION WAS PERFORMED  Ankit Lorie Phenix 08/15/2017 11:53 AM

## 2017-08-15 NOTE — Procedures (Signed)
After placing Topical Lidocaine in each of the wound bases for pain control wound  underwent sharp excisional debridement using #3 curette.  Removing devitalized, necrotic tissue going into/through the subcutaneous and fascial layer until healthy tissue is exposed.  Bleeding was Mild. Bleeding was controlled with pressure.  Complications were absent.   Debridement is medically necessary to promote wound healing.   Pre-Debridement Measurements 7  x 5  x 1  Post-Debridement Measurements 8 x 6 x 1.1.  Total Centimeters Squared Debrided = 52.8.

## 2017-08-15 NOTE — Progress Notes (Signed)
Patient previously refusing CPAP on multiple nights, also per pt. RN. Patient currently in dialysis. No equipment in room.

## 2017-08-15 NOTE — Progress Notes (Signed)
Occupational Therapy Session Note  Patient Details  Name: [name] MRN: [id number] Date of Birth: 04-May-1956  Today's Date: 08/15/2017 OT Individual Time: 7903-8333 OT Individual Time Calculation (min): 60 min    Short Term Goals: Week 2:  OT Short Term Goal 1 (Week 2): STG = LTG due to remaining LOS  Skilled Therapeutic Interventions/Progress Updates:    Treatment focus on ADLs, standing balance, and functional transfers. Pt reporting lower back pain rating 5/10 but willing to continue session. Pt ambulated ~5' x2 to obtain clothing from suitcase prior to bathing task with use of RW requiring min A to correct LOB x2. Therapist educated pt throughout session of energy conservation and safety while performing ADLs.  Therapist discussed at home set-up and pt concerns. Therapist challenged pt to practice simulated toilet transfer in preparation for d/c. Pt sit > stand at lower level surface x2 with use of RW and min guard fading to supervision secondary to muscle weakness. Pt completed standing activity ~58min of reaching/crossing midline with therapist with use of BUE to simulate functional tasks. Pt left EOB awaiting PT with all needs in reach.  Therapy Documentation Precautions:  Precautions Precautions: Fall Restrictions Weight Bearing Restrictions: No General: General PT Missed Treatment Reason: Patient fatigue Vital Signs:   Pain: Pain Assessment Pain Assessment: 0-10 Pain Score: 9  Pain Type: Acute pain Pain Location: Sacrum Pain Orientation: Mid Pain Descriptors / Indicators: Aching Pain Frequency: Intermittent Pain Onset: Gradual Pain Intervention(s): Medication (See eMAR)  See Function Navigator for Current Functional Status.   Therapy/Group: Individual Therapy  Hoyt Koch 08/15/2017, 10:50 AM

## 2017-08-15 NOTE — Progress Notes (Addendum)
ANTICOAGULATION CONSULT NOTE - Follow Up Consult  Pharmacy Consult for warfarin Indication: atrial fibrillation  No Known Allergies  Patient Measurements: Height: 5\' 10"  (177.8 cm) Weight: 180 lb 12.4 oz (82 kg) IBW/kg (Calculated) : 73  Vital Signs: Temp: 97.6 F (36.4 C) (09/10 0357) Temp Source: Axillary (09/10 0357) BP: 118/64 (09/10 0357) Pulse Rate: 75 (09/10 0357)  Labs:  Recent Labs  08/12/17 1600 08/13/17 0549 08/13/17 1429 08/13/17 1430 08/14/17 0737 08/15/17 0448  HGB 9.2*  --   --  9.0*  --   --   HCT 28.8*  --   --  29.6*  --   --   PLT 132*  --   --  110*  --   --   LABPROT  --  29.4*  --   --  27.2* 29.8*  INR  --  2.82  --   --  2.55 2.87  CREATININE 3.86*  --  2.66*  --   --   --     Estimated Creatinine Clearance: 29.7 mL/min (A) (by C-G formula based on SCr of 2.66 mg/dL (H)).  Assessment: 61yo M continues on warfarin for afib, appears to be very sensitive to dose adjustments.  INR is therapeutic at 2.8 today. CBC stable. No signs or symptoms of bleeding noted. Dietary intake 100% yesterday.  PTA dose: 2mg  daily except 1mg  Tue/Thur  Goal of Therapy:  INR 2-3 Monitor platelets by anticoagulation protocol: Yes   Plan:  Warfarin 0.5 mg PO today Daily INR, CBCs every 3 days F/u s/sx bleeding, PO intake, DDI  Erin Hearing PharmD., BCPS Clinical Pharmacist Pager [phone] 08/15/2017 10:01 AM

## 2017-08-15 NOTE — Progress Notes (Addendum)
Physical Therapy Note  Patient Details  Name: [name] MRN: [id number] Date of Birth: January 05, 1956 Today's Date: 08/15/2017    Time: 169-450 48 minutes  1:1 No c/o pain.  Pt fatigued from prior OT session but willing to participate with therapy.  Pt performed sit to stand and stand pivot transfers throughout session with supervision with RW.  Gait 150', 30' x 2 with supervision with RW.  Curb step negotiation to simulate home entry x 4 attempts with RW and supervision.  Gait up/down ramp with supervision, cues for safety.  Simulated car transfer with pt introduced to leg lifter to lift Rt LE into car. Pt with good use of leg lifter and is able to perform transfer with supervision.  Discussed having pt's wife come tomorrow during therapy to practice transfer into real car. Pt states he is agreeable and will contact wife.   Pt uses LE lifter for Rt LE and is able to perform supine <> sit with supervision.  Pt missed final 12 minutes of session for nursing care and fatigue.  Time 2:  1400-1412 12 minutes  1:1 Pt with c/o 6/10 pain on bottom where wound was just debrided. Pt refuses therapy at this time due to pain.  PT went over written HEP and pt was able to verbalize understanding of all exercises. Pt encouraged pt to perform bed level HEP later today to make up for missed therapy time. Pt verbalizes understanding.   Ramzi Brathwaite 08/15/2017, 9:22 AM

## 2017-08-15 NOTE — Progress Notes (Signed)
Advanced Heart Failure Rounding Note  PCP:  Primary Cardiologist: Dr. Haroldine Laws   Subjective:   Weight is up over the weekend. Denies dyspnea. BP stable on midodrine. Lower extremity wounds debrided today. Plan for HD today.   INR 2.87   Objective:   Weight Range: 180 lb 12.4 oz (82 kg) Body mass index is 25.94 kg/m.   Vital Signs:   Temp:  [97.6 F (36.4 C)-97.9 F (36.6 C)] 97.6 F (36.4 C) (09/10 0357) Pulse Rate:  [51-75] 75 (09/10 0357) Resp:  [18] 18 (09/10 0357) BP: (82-118)/(41-64) 118/64 (09/10 0357) SpO2:  [95 %-98 %] 95 % (09/10 0357) Weight:  [180 lb 12.4 oz (82 kg)] 180 lb 12.4 oz (82 kg) (09/10 0357) Last BM Date: 08/14/17  Weight change: Filed Weights   08/13/17 1833 08/14/17 0645 08/15/17 0357  Weight: 185 lb 3 oz (84 kg) 182 lb 15.7 oz (83 kg) 180 lb 12.4 oz (82 kg)    Intake/Output:   Intake/Output Summary (Last 24 hours) at 08/15/17 0950 Last data filed at 08/15/17 0352  Gross per 24 hour  Intake              720 ml  Output              250 ml  Net              470 ml      Physical Exam    General: Chronically ill. No resp difficulty HEENT: normal Neck: supple.JVP to jaw. Carotids 2+ bilat; no bruits. No lymphadenopathy or thryomegaly appreciated. Cor: PMI nondisplaced. Regular rate & rhythm. 2/6 SEM RSB Lungs: clear Abdomen: soft, nontender, nondistended. No hepatosplenomegaly. No bruits or masses. Good bowel sounds. Extremities: no cyanosis, clubbing, rash, R and LLE 2+ edema. RUE fistula  Neuro: alert & orientedx3, cranial nerves grossly intact. moves all 4 extremities w/o difficulty. Affect pleasant   Telemetry   Not connected  EKG    N/A  Labs    CBC  Recent Labs  08/12/17 1600 08/13/17 1430  WBC 9.6 9.6  HGB 9.2* 9.0*  HCT 28.8* 29.6*  MCV 97.3 99.3  PLT 132* 937*   Basic Metabolic Panel  Recent Labs  08/12/17 1600 08/13/17 1429  NA 127* 130*  K 4.0 3.2*  CL 91* 94*  CO2 24 26  GLUCOSE 119* 221*    BUN 42* 25*  CREATININE 3.86* 2.66*  CALCIUM 8.7* 8.2*  PHOS 2.9 1.9*   Liver Function Tests  Recent Labs  08/12/17 1600 08/13/17 1429  ALBUMIN 1.9* 1.9*   No results for input(s): LIPASE, AMYLASE in the last 72 hours. Cardiac Enzymes No results for input(s): CKTOTAL, CKMB, CKMBINDEX, TROPONINI in the last 72 hours.  BNP: BNP (last 3 results)  Recent Labs  06/13/17 1205 07/09/17 2151  BNP 1,151.0* 1,553.9*    ProBNP (last 3 results) No results for input(s): PROBNP in the last 8760 hours.   D-Dimer No results for input(s): DDIMER in the last 72 hours. Hemoglobin A1C No results for input(s): HGBA1C in the last 72 hours. Fasting Lipid Panel No results for input(s): CHOL, HDL, LDLCALC, TRIG, CHOLHDL, LDLDIRECT in the last 72 hours. Thyroid Function Tests No results for input(s): TSH, T4TOTAL, T3FREE, THYROIDAB in the last 72 hours.  Invalid input(s): FREET3  Other results:   Imaging    No results found.   Medications:     Scheduled Medications: . colchicine  0.3 mg Oral Daily  . collagenase  1  application Topical Daily  . darbepoetin (ARANESP) injection - NON-DIALYSIS  200 mcg Subcutaneous Q Wed-1800  . feeding supplement (ENSURE ENLIVE)  237 mL Oral Q1500  . feeding supplement (PRO-STAT SUGAR FREE 64)  30 mL Oral BID  . furosemide  160 mg Oral Daily  . hydrocerin   Topical BID  . insulin aspart  0-5 Units Subcutaneous QHS  . insulin aspart  3 Units Subcutaneous TID WC  . insulin glargine  8 Units Subcutaneous QHS  . loratadine  10 mg Oral Daily  . midodrine  10 mg Oral TID WC  . multivitamin  1 tablet Oral QHS  . sucroferric oxyhydroxide  500 mg Oral TID WC  . Warfarin - Pharmacist Dosing Inpatient   Does not apply q1800    Infusions: . albumin human    .  ceFAZolin (ANCEF) IV Stopped (08/14/17 1900)    PRN Medications: acetaminophen, alum & mag hydroxide-simeth, HYDROcodone-acetaminophen, ondansetron **OR** ondansetron (ZOFRAN) IV, sodium  chloride, sodium chloride flush, sorbitol    Patient Profile   [name] is a 61 year old with a history of DMII, [place] 2016, rectal cancer, CKD Stage IV, chronic A fib, S/P AVN ablation, PAF, CAD BMS LAD 2011 BMS LAD 2009, OSA, chronic systolic heart failure, St Jude BiV ICD   Previously admitted with AKI and shock with elevated lactate, suspect cardiogenic.   Moved to CIR 08/02/17.  Assessment/Plan   1. Acute on chronic systolic CHF/cardiogenic shock: Last echo in 8/17 with EF 15-20%.  Ischemic cardiomyopathy, has St Jude BiV ICD (despite chronic afib appears to be effectively BiV pacing).  Had recent admission (discharged 06/21/17) requiring milrinone support and diuresis.  Admitted with soft BP (SBP 80s-90s) + elevated lactate.  Suspect mixed septic/cardiogenic shock with MSSA bacteremia.  - CVVHD stopped 07/20/17. Continues on iHD. Making some urine.  - No beta blocker with decompensation, hypotension.  - No ARB with AKI.   2. AKI on CKD stage 4:  -Tolerating iHD.  - Per Nephrology. Appreciate their management of patient.   3. Atrial fibrillation: Chronic - Continue coumadin. No plans to extract device.  - INR 2.87. Dosing per HF pharmacy.    4. CAD:  - No s/s ischemia.   - Continue ASA. No statin with recent elevated LFT's    5. ID:  - Blood CX 2/2 MSSA. Antibiotics narrowed, now on Ancef. 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   - Not planning ICD extraction for now due to severe comorbidities. He is device dependent.  - TEE 07/19/17 with no evidence of endocarditis.  - Per ID continue cefazolin until 9/15 the start renally-dosed cephalexin. PICC in place.   6. H/O Rectal CA with diverting colostomy   7. Type II diabetes:  - Per primary.   8. Liver failure:  - LFTs resolved on initial admit. Resolved.    9. Anemia of chronic disease. - s/p RBCs on 8/17.  - No bleeding problems.     10. Deconditioning--  - Appreciate CIR. D/C later this week.      Length of Stay: [place], NP  08/15/2017, 9:50 AM  Advanced Heart Failure Team Pager [phone] (M-F; 7a - 4p)  Please contact [facility] for night-coverage after hours (4p -7a ) and weekends on [email or web address]  Patient seen and examined with Robert Grinder, NP. We discussed all aspects of the encounter. I agree with the assessment and plan as stated above.   He remains tenuous and volume overloaded.  Functional capacity improving with rehab. Continue volume removal with HD. Continue suppressive abx.   I worry about his short-term prognosis.  Glori Bickers, MD  1:36 PM

## 2017-08-16 ENCOUNTER — Inpatient Hospital Stay (HOSPITAL_COMMUNITY): Payer: Medicare Other | Admitting: Physical Therapy

## 2017-08-16 ENCOUNTER — Inpatient Hospital Stay (HOSPITAL_COMMUNITY): Payer: Medicare Other | Admitting: Occupational Therapy

## 2017-08-16 LAB — GLUCOSE, CAPILLARY
GLUCOSE-CAPILLARY: 92 mg/dL (ref 65–99)
Glucose-Capillary: 100 mg/dL — ABNORMAL HIGH (ref 65–99)
Glucose-Capillary: 125 mg/dL — ABNORMAL HIGH (ref 65–99)
Glucose-Capillary: 136 mg/dL — ABNORMAL HIGH (ref 65–99)
Glucose-Capillary: 187 mg/dL — ABNORMAL HIGH (ref 65–99)

## 2017-08-16 LAB — PROTIME-INR
INR: 2.45
PROTHROMBIN TIME: 26.4 s — AB (ref 11.4–15.2)

## 2017-08-16 MED ORDER — HYDROCODONE-ACETAMINOPHEN 5-325 MG PO TABS
ORAL_TABLET | ORAL | Status: AC
  Administered 2017-08-16: 2
  Filled 2017-08-16: qty 2

## 2017-08-16 MED ORDER — MIDODRINE HCL 5 MG PO TABS
ORAL_TABLET | ORAL | Status: AC
  Administered 2017-08-16: 10 mg via ORAL
  Filled 2017-08-16: qty 2

## 2017-08-16 MED ORDER — WARFARIN SODIUM 1 MG PO TABS
1.0000 mg | ORAL_TABLET | Freq: Once | ORAL | Status: AC
  Administered 2017-08-16: 1 mg via ORAL
  Filled 2017-08-16: qty 1

## 2017-08-16 NOTE — Progress Notes (Signed)
Occupational Therapy Session Note  Patient Details  Name: [name] MRN: [id number] Date of Birth: 26-Mar-1956  Today's Date: 08/16/2017 OT Individual Time: 8453-6468 OT Individual Time Calculation (min): 61 min    Short Term Goals: Week 2:  OT Short Term Goal 1 (Week 2): STG = LTG due to remaining LOS  Skilled Therapeutic Interventions/Progress Updates:    Treatment focused on self-care/ADLs and functional mobility in preparation for d/c. Pt with no c/o of pain but reporting fatigue due to decreased sleep. Pt agreeable to therapy session and reported feeling "excited" to go home. Therapist had pt ambulate using RW to obtain clothing with supervision prior to bathing to facilitate a consistent routine. Pt completed grooming tasks at sink in w/c with setup. Pt able to perform UB bathing and dressing with set-up and supervision while seated in w/c at sink. Therapist provided supervision during LB bathing and dressing for pt safety while standing at sink. Therapist had pt ambulate ~5' from w/c > bed to simulate bedroom to bathroom distance at home. Pt able to perform task with supervision prior to returning to bed at end of session. Pt required supervision for sit > lying in bed. Pt left with call bell in reach.  Therapy Documentation Precautions:  Precautions Precautions: Fall Restrictions Weight Bearing Restrictions: No General:   Vital Signs: Therapy Vitals Pulse Rate: (!) 56 BP: (!) 92/47 Patient Position (if appropriate): Lying Pain: Pain Assessment Pain Assessment: No/denies pain  See Function Navigator for Current Functional Status.   Therapy/Group: Individual Therapy  Hoyt Koch 08/16/2017, 12:08 PM

## 2017-08-16 NOTE — Progress Notes (Signed)
Occupational Therapy Discharge Summary  Patient Details  Name: [name] MRN: [id number] Date of Birth: May 21, 1956   Patient has met 9 of 9 long term goals due to improved activity tolerance, improved balance, postural control and ability to compensate for deficits.  Patient to discharge at overall Supervision level.  Patient's care partner is independent to provide the necessary supervision assistance at discharge.    Patient's spouse has been present for multiple therapy sessions throughout pt stay and has demonstrated verbally and actively how to provide supervision for patient when d/c home.  Reasons goals not met: N/A  Recommendation:  Patient will benefit from ongoing skilled OT services in home health setting to continue to advance functional skills in the area of BADL and Reduce care partner burden.  Equipment: No equipment provided  Reasons for discharge: treatment goals met  Patient/family agrees with progress made and goals achieved: Yes  OT Discharge Precautions/Restrictions  Precautions Precautions: Fall General   Vital Signs Therapy Vitals Temp: 98.2 F (36.8 C) Temp Source: Oral Pulse Rate: 76 Resp: 18 BP: (!) 99/56 Patient Position (if appropriate): Lying Oxygen Therapy SpO2: 99 % O2 Device: Not Delivered Pain Pain Assessment Pain Assessment: No/denies pain ADL  See Function Navigator Vision Baseline Vision/History: Wears glasses Wears Glasses: Reading only Patient Visual Report: No change from baseline Vision Assessment?: No apparent visual deficits Perception  Perception: Within Functional Limits Praxis Praxis: Intact Cognition Overall Cognitive Status: Within Functional Limits for tasks assessed Arousal/Alertness: Awake/alert Orientation Level: Oriented X4 Attention: Alternating Memory: Appears intact Safety/Judgment: Appears intact Sensation Sensation Light Touch: Appears Intact Proprioception: Appears  Intact Coordination Gross Motor Movements are Fluid and Coordinated: No Fine Motor Movements are Fluid and Coordinated: Yes   Extremity/Trunk Assessment RUE Assessment RUE Assessment: Within Functional Limits LUE Assessment LUE Assessment: Within Functional Limits   See Function Navigator for Current Functional Status.  Hoyt Koch 08/16/2017, 3:44 PM

## 2017-08-16 NOTE — Progress Notes (Signed)
HD tx initiated via 15Gx2 w/o problem, pull/push/flush equally w/o problem, VSS, will cont to monitor while on HD tx 

## 2017-08-16 NOTE — Progress Notes (Signed)
Post Dialysis procedure report received from [place], [place], tolerated procedure well. Pain med administered at 715p.

## 2017-08-16 NOTE — Procedures (Signed)
Patient seen and examined on Hemodialysis. QB 400 UF goal 3L.  Getting vol under better control with serial treatments.   Treatment adjusted as needed.  Madelon Lips MD [facility] pgr [phone] 4:50 PM

## 2017-08-16 NOTE — Progress Notes (Signed)
Physical Therapy Discharge Summary  Patient Details  Name: [name] MRN: [id number] Date of Birth: December 26, 1955  Today's Date: 08/16/2017 PT Individual Time: 0930-1025 PT Individual Time Calculation (min): 55 min   Pt performed w/c mobility up to 100' with supervision.  Pt performed gait outdoors 150' with 1 standing rest break with RW and supervision.  Pt performed transfer to real SUV with increased time, supervision and use of LE lifter.  Curb step training x 4 steps with supervision, good safety awareness.  Bed mobility with use of LE lifter with increased time and supervision.  Pt's wife present for session and is comfortable providing supervision level assistance.  Both pt and wife state they feel safe for d/c home at this level of care.  Patient has met 7 of 7 long term goals due to improved activity tolerance, improved balance, increased strength and ability to compensate for deficits.  Patient to discharge at an ambulatory level Supervision.   Patient's care partner is independent to provide the necessary supervision assistance at discharge.  Reasons goals not met: n/a  Recommendation:  Patient will benefit from ongoing skilled PT services in home health setting to continue to advance safe functional mobility, address ongoing impairments in endurance, strength, gait, balance, and minimize fall risk.  Equipment: RW, w/c, hospital bed  Reasons for discharge: treatment goals met and discharge from hospital  Patient/family agrees with progress made and goals achieved: Yes  PT Discharge Precautions/Restrictions Precautions Precautions: Fall Restrictions Weight Bearing Restrictions: No  Pain Pain Assessment Pain Assessment: No/denies pain  Cognition Overall Cognitive Status: Within Functional Limits for tasks assessed Arousal/Alertness: Awake/alert Sensation Sensation Light Touch: Appears Intact Proprioception: Appears Intact Coordination Gross Motor Movements are  Fluid and Coordinated: No Motor  Motor Motor - Discharge Observations: generalized weakness   Trunk/Postural Assessment  Cervical Assessment Cervical Assessment: Within Functional Limits Thoracic Assessment Thoracic Assessment: Within Functional Limits Lumbar Assessment Lumbar Assessment:  (posterior pelvic tilt) Postural Control Righting Reactions: delayed  Balance Dynamic Sitting Balance Sitting balance - Comments: independent Dynamic Standing Balance Dynamic Standing - Comments: supervision RW Extremity Assessment      [name] Assessment [name] Assessment:  (hip 3-/5, knee ext 3/5, ankle DF 3-/5) LLE Assessment LLE Assessment: Within Functional Limits   See Function Navigator for Current Functional Status.  Mayline Dragon 08/16/2017, 10:27 AM

## 2017-08-16 NOTE — Progress Notes (Signed)
Assessment/ Plan:   1. MSSA bacteremia on Ancef though 9/15 and then PO cefalexein. 2. ESRD: Continue hemodialysis via right brachiocephalic fistula. Process underway for outpatient dialysis unit placement to [facility] (he was previously a patient there).  However he expressed that he wants to do [facility].  Have sent papers.  Have given serial HD treatments for vol overload.  This is coming under better control so will stop serial HD treatments.    3. Deconditioning: rehabilitation in CIR 4. Afib: on warfarn 5.  H/o rectal cancer: diverting ostomy 6.  Acute on chronic systolic CHF: EF 91-47%.  St Jude BiV ICD in place.  Per cardiology, no plans for lead extraction due to comorbids.  Subjective: Interval History: Tolerating dialysis well.    Objective: Vital signs in last 24 hours: Temp:  [97.6 F (36.4 C)-98.2 F (36.8 C)] 97.6 F (36.4 C) (09/11 1545) Pulse Rate:  [56-79] 69 (09/11 1630) Resp:  [9-20] 18 (09/11 1545) BP: (89-110)/(46-79) 98/53 (09/11 1630) SpO2:  [95 %-100 %] 95 % (09/11 1545) Weight:  [83 kg (182 lb 15.7 oz)-86 kg (189 lb 9.5 oz)] 84 kg (185 lb 3 oz) (09/11 1545) Weight change: 4 kg (8 lb 13.1 oz)  Intake/Output from previous day: 09/10 0701 - 09/11 0700 In: 460 [P.O.:460] Out: 3201 [Urine:200] Intake/Output this shift: Total I/O In: 480 [P.O.:480] Out: -     GEN NAD HEENT EOMI, perrl NECK + JVD PULM clear  CV RRR ABD mildly distended EXT wrapped, 2+ LE edema, improving NEURO AAO x3  Lab Results:  Recent Labs  08/15/17 2130  WBC 9.1  HGB 9.4*  HCT 30.5*  PLT 121*   BMET:   Recent Labs  08/15/17 2130  NA 129*  K 3.6  CL 95*  CO2 24  GLUCOSE 168*  BUN 39*  CREATININE 4.03*  CALCIUM 8.6*   No results for input(s): PTH in the last 72 hours. Iron Studies: No results for input(s): IRON, TIBC, TRANSFERRIN, FERRITIN in the last 72 hours. Studies/Results: No results found.  Scheduled: . colchicine  0.3 mg Oral  Daily  . collagenase  1 application Topical Daily  . darbepoetin (ARANESP) injection - NON-DIALYSIS  200 mcg Subcutaneous Q Wed-1800  . feeding supplement (ENSURE ENLIVE)  237 mL Oral Q1500  . feeding supplement (PRO-STAT SUGAR FREE 64)  30 mL Oral BID  . hydrocerin   Topical BID  . insulin aspart  0-5 Units Subcutaneous QHS  . insulin aspart  3 Units Subcutaneous TID WC  . insulin glargine  8 Units Subcutaneous QHS  . loratadine  10 mg Oral Daily  . midodrine  10 mg Oral TID WC  . multivitamin  1 tablet Oral QHS  . sucroferric oxyhydroxide  500 mg Oral TID WC  . warfarin  1 mg Oral ONCE-1800  . Warfarin - Pharmacist Dosing Inpatient   Does not apply q1800     LOS: 14 days   Sammy Douthitt 08/16/2017,4:42 PM

## 2017-08-16 NOTE — Progress Notes (Signed)
ANTICOAGULATION CONSULT NOTE - Follow Up Consult  Pharmacy Consult for warfarin Indication: atrial fibrillation  No Known Allergies  Patient Measurements: Height: 5\' 10"  (177.8 cm) Weight: 183 lb 4.4 oz (83.1 kg) IBW/kg (Calculated) : 73  Vital Signs: Temp: 98.1 F (36.7 C) (09/11 0550) Temp Source: Oral (09/11 0550) BP: 92/47 (09/11 0902) Pulse Rate: 56 (09/11 0902)  Labs:  Recent Labs  08/13/17 1429 08/13/17 1430 08/14/17 0737 08/15/17 0448 08/15/17 2130 08/16/17 0710  HGB  --  9.0*  --   --  9.4*  --   HCT  --  29.6*  --   --  30.5*  --   PLT  --  110*  --   --  121*  --   LABPROT  --   --  27.2* 29.8*  --  26.4*  INR  --   --  2.55 2.87  --  2.45  CREATININE 2.66*  --   --   --  4.03*  --     Estimated Creatinine Clearance: 19.6 mL/min (A) (by C-G formula based on SCr of 4.03 mg/dL (H)).  Assessment: 61yo M continues on warfarin for afib, appears to be very sensitive to dose adjustments.  INR is therapeutic at 2.45 today. CBC has been stable. No signs or symptoms of bleeding noted.  PTA dose: 2mg  daily except 1mg  Tue/Thur  Goal of Therapy:  INR 2-3 Monitor platelets by anticoagulation protocol: Yes   Plan:  Warfarin 1 mg PO today Daily INR, CBCs every 3 days   Erin Hearing PharmD., BCPS Clinical Pharmacist Pager [phone] 08/16/2017 9:53 AM

## 2017-08-16 NOTE — Progress Notes (Signed)
[facility]IC[facility]ILITATION     PROGRESS NOTE  Subjective/Complaints:  Pt seen sitting up in bed this AM.  He slept well overnight.  He states he feels better after debridement.    ROS: Denies CP, SOB, N/V/D.  Objective: Vital Signs: Blood pressure (!) 92/47, pulse (!) 56, temperature 98.1 F (36.7 C), temperature source Oral, resp. rate 16, height 5\' 10"  (1.778 m), weight 83.1 kg (183 lb 4.4 oz), SpO2 100 %. No results found.  Recent Labs  08/13/17 1430 08/15/17 2130  WBC 9.6 9.1  HGB 9.0* 9.4*  HCT 29.6* 30.5*  PLT 110* 121*    Recent Labs  08/13/17 1429 08/15/17 2130  NA 130* 129*  K 3.2* 3.6  CL 94* 95*  GLUCOSE 221* 168*  BUN 25* 39*  CREATININE 2.66* 4.03*  CALCIUM 8.2* 8.6*   CBG (last 3)   Recent Labs  08/15/17 1650 08/16/17 0237 08/16/17 0630  GLUCAP 113* 100* 125*    Wt Readings from Last 3 Encounters:  08/16/17 83.1 kg (183 lb 4.4 oz)  08/02/17 70.2 kg (154 lb 12.2 oz)  06/21/17 79.2 kg (174 lb 8 oz)    Physical Exam:  BP (!) 92/47 (BP Location: Left Arm)   Pulse (!) 56   Temp 98.1 F (36.7 C) (Oral)   Resp 16   Ht 5\' 10"  (1.778 m)   Wt 83.1 kg (183 lb 4.4 oz)   SpO2 100%   BMI 26.30 kg/m  Constitutional: No distress. Vital signs reviewed.  HENT: Normocephalic and atraumatic.  Eyes: EOM are normal. No discharge.  Cardiovascular: Cardiac rate controlled. No JVD. Respiratory: Effort normal and breath sounds normal.  GI: Bowel sounds are normal. He exhibits no distension.  Colostomy in place . Musc: No edema. No tenderness Neurological: Alert.  Motor: b/l UE 4/5 deltoid, biceps, triceps, wrist ext, HI (stable).  LLE: 4-/5 HF and 4+/5 KE and 5/5 ADF/PF.  [name]: 4-/5 HF, 4/5 KE and 4+/5 ADF/PF. (unchanged) Skin: Multiple wounds. Sacral ulcer unstageable, not examined today.  Psych: flat, cooperative   Assessment/Plan: 1. Functional deficits secondary to debility which require 3+ hours per day of interdisciplinary  therapy in a comprehensive inpatient rehab setting. Physiatrist is providing close team supervision and 24 hour management of active medical problems listed below. Physiatrist and rehab team continue to assess barriers to discharge/monitor patient progress toward functional and medical goals.  Function:  Bathing Bathing position   Position: Wheelchair/chair at sink  Bathing parts Body parts bathed by patient: Right arm, Left arm, Chest, Abdomen, Front perineal area, Buttocks, Right upper leg, Left upper leg Body parts bathed by helper: Back  Bathing assist Assist Level: Supervision or verbal cues      Upper Body Dressing/Undressing Upper body dressing   What is the patient wearing?: Pull over shirt/dress     Pull over shirt/dress - Perfomed by patient: Thread/unthread right sleeve, Thread/unthread left sleeve, Put head through opening, Pull shirt over trunk          Upper body assist Assist Level: Set up   Set up : To obtain clothing/put away  Lower Body Dressing/Undressing Lower body dressing   What is the patient wearing?: Underwear, Pants Underwear - Performed by patient: Thread/unthread right underwear leg, Thread/unthread left underwear leg, Pull underwear up/down Underwear - Performed by helper: Thread/unthread left underwear leg Pants- Performed by patient: Thread/unthread right pants leg, Thread/unthread left pants leg, Pull pants up/down, Fasten/unfasten pants Pants- Performed by helper: Thread/unthread right pants  leg   Non-skid slipper socks- Performed by helper: Don/doff right sock, Don/doff left sock                  Lower body assist Assist for lower body dressing: Supervision or verbal cues   Set up : To obtain clothing/put away  Toileting Toileting Toileting activity did not occur: No continent bowel/bladder event   Toileting steps completed by helper: Adjust clothing prior to toileting, Performs perineal hygiene, Adjust clothing after toileting (per  Elmo Putt, NT report)    Toileting assist     Transfers Chair/bed transfer   Chair/bed transfer method: Ambulatory Chair/bed transfer assist level: Supervision or verbal cues Chair/bed transfer assistive device: Armrests, Medical sales representative     Max distance: 150 Assist level: Supervision or verbal cues   Wheelchair   Type: Manual Max wheelchair distance: 143ft  Assist Level: Supervision or verbal cues  Cognition Comprehension Comprehension assist level: Follows complex conversation/direction with extra time/assistive device  Expression Expression assist level: Expresses basic needs/ideas: With no assist  Social Interaction Social Interaction assist level: Interacts appropriately with others - No medications needed.  Problem Solving Problem solving assist level: Solves basic 75 - 89% of the time/requires cueing 10 - 24% of the time  Memory Memory assist level: Recognizes or recalls 90% of the time/requires cueing < 10% of the time    Medical Problem List and Plan:  1. Severe debilitation/encephalopathy secondary to respiratory failure, CHF, MSSA sepsis   Cont CIR 2. DVT Prophylaxis/Anticoagulation: Chronic Coumadin therapy and monitor for rebleeding episodes   INR therpeutic on 9/11 3. Pain Management: Hydrocodone as needed  4. Mood: Provide emotional support  5. Neuropsych: This patient is capable of making decisions on his own behalf.  6. Skin/Wound Care/lower extremity leg wound: Routine skin checks with skin care as directed   Appreciate WOC recs  Debrided 9/10 7. Fluids/Electrolytes/Nutrition: Routine I&Os 8. CKD stage IV. Followed closely by renal services.   On dialysis, appreciate nephro recs, aggressively diuresing as tolerated   Cont to monitor 9. MSSA bacteremia. Intravenous cefazolin through 08/21/2017 then chronic Keflex and follow-up per infectious disease  10. 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 Routine colostomy  care  11. CAD with implantable defibrillator. Follow-up cardiology services. No plan to extract device  12. Chronic systolic congestive heart failure/ischemic cardiomyopathy. Monitor for any signs of fluid overload.  Filed Weights   08/15/17 2133 08/16/17 0212 08/16/17 0550  Weight: 86 kg (189 lb 9.5 oz) 83 kg (182 lb 15.7 oz) 83.1 kg (183 lb 4.4 oz)  13. Diabetes mellitus and peripheral neuropathy.   Lantus insulin 10 decreased to 6U due to hypoglycemia on 9/1, Increased to 8 units on 9/2  Relatively controlled 9/11  Will consider further adjustments if necessary 14. Orthostasis. Continue ProAmatine 10 mg 3 times a day.  Controlled 15. Acute on chronic anemia. Continue Aranesp.   Hb 9.4 on 9/10  Labs with HD 16. History of gout. Colchicine 0.3 mg daily. Monitor for any gout flareups  17. Malnourishment  Supplement initiated 8/29 18. Thrombocytopenia  Plts 121 on 9/10  Labs with HD  Cont to monitor  LOS (Days) 14 A FACE TO FACE EVALUATION WAS PERFORMED  Florence Antonelli Lorie Phenix 08/16/2017 9:49 AM

## 2017-08-16 NOTE — Progress Notes (Signed)
HD tx completed @ 0145 w/o problem, UF goal met, blood rinsed back, VSS, report called to Marcello Fennel, RN

## 2017-08-16 NOTE — Progress Notes (Signed)
Occupational Therapy Session Note  Patient Details  Name: [name] MRN: [id number] Date of Birth: February 06, 1956  Today's Date: 08/16/2017 OT Individual Time: 1331-1405 OT Individual Time Calculation (min): 34 min    Short Term Goals: Week 2:  OT Short Term Goal 1 (Week 2): STG = LTG due to remaining LOS  Skilled Therapeutic Interventions/Progress Updates:    Pt seen to focus on functional transfers in preparation to d/c and HEP for UB strengthening to promote independence with self-care tasks. Pt with no c/o pain received sitting EOB. Therapist provided pt with HEP for UB strengthening prior to d/c to increase BUE strength after d/c. Therapist providing demonstration of each exercise with pt demonstrating carryover. Pt completed simulated shower transfer with RW with supervision. Pt able to successfully complete toilet transfer with RW to [facility] with supervision. However, pt required min. A to stand from regular toilet due to lower surface and decreased strength. Pt returned to bed sit > lying with supervision with use of leg lifter and call bell in reach.  Therapy Documentation Precautions:  Precautions Precautions: Fall Restrictions Weight Bearing Restrictions: No  Therapy Vitals Temp: 98.2 F (36.8 C) Temp Source: Oral Pulse Rate: 76 Resp: 18 BP: (!) 99/56 Patient Position (if appropriate): Lying Oxygen Therapy SpO2: 99 % O2 Device: Not Delivered See Function Navigator for Current Functional Status.   Therapy/Group: Individual Therapy  Hoyt Koch 08/16/2017, 3:52 PM

## 2017-08-16 NOTE — Progress Notes (Signed)
Nutrition Follow-up  DOCUMENTATION CODES:   Non-severe (moderate) malnutrition in context of chronic illness  INTERVENTION:  Continue 30 ml Prostat po BID, each supplement provides 100 kcal and 15 grams of protein.   Continue Ensure Enlive po once daily, each supplement provides 350 kcal and 20 grams of protein.  Encourage adequate PO intake.   NUTRITION DIAGNOSIS:   Malnutrition (moderate) related to chronic illness (CKD V) as evidenced by moderate depletion of body fat, severe depletion of muscle mass; ongoing  GOAL:   Patient will meet greater than or equal to 90% of their needs; met  MONITOR:   PO intake, Supplement acceptance, Labs, Weight trends, Skin, I & O's  REASON FOR ASSESSMENT:   Malnutrition Screening Tool    ASSESSMENT:   61 y.o. 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  Presented 07/10/2017 with increasing bilateral lower extremity edema and increasing shortness of breath. Chest x-ray showed right pleural effusion and right lung base atelectasis versus infiltrate. CT abdomen and pelvis showed cirrhosis moderate ascites. Echocardiogram with ejection fraction of 03-00% systolic function severely reduced. TEE completed showing no evidence of endocarditis and no plans to extract ICD device. Renal service follow-up for severe CKD stage IV initially requiring CRRT and latest creatinine 3.45 and presently on dialysis.   Meal completion has been 100%. Intake has been adequate. Pt currently has Ensure and Prostat ordered and has been consuming them. RD to continue with current orders to aid in increased nutrition. Labs and medications reviewed.   Diet  Order:  Diet renal/carb modified with fluid restriction Diet-HS Snack? Nothing; Room service appropriate? Yes; Fluid consistency: Thin; Fluid restriction: 1200 mL Fluid  Skin:  Wound (see comment) (Unstagable to sacrum, lower extremity wounds)  Last BM:  9/10  Height:   Ht Readings from Last 1 Encounters:  08/02/17 5' 10" (1.778 m)    Weight:   Wt Readings from Last 1 Encounters:  08/16/17 185 lb 3 oz (84 kg)    Ideal Body Weight:  75.45 kg  BMI:  Body mass index is 26.57 kg/m.  Estimated Nutritional Needs:   Kcal:  2100-2300  Protein:  105-120 grams  Fluid:  1.2 L/day  EDUCATION NEEDS:   No education needs identified at this time  Corrin Parker, MS, RD, LDN Pager # [phone] After hours/ weekend pager # [phone]

## 2017-08-17 ENCOUNTER — Encounter (HOSPITAL_COMMUNITY): Payer: Medicare Other | Admitting: Psychology

## 2017-08-17 LAB — GLUCOSE, CAPILLARY
GLUCOSE-CAPILLARY: 133 mg/dL — AB (ref 65–99)
Glucose-Capillary: 149 mg/dL — ABNORMAL HIGH (ref 65–99)
Glucose-Capillary: 102 mg/dL — ABNORMAL HIGH (ref 65–99)
Glucose-Capillary: 125 mg/dL — ABNORMAL HIGH (ref 65–99)

## 2017-08-17 LAB — PROTIME-INR
INR: 2.26
PROTHROMBIN TIME: 24.7 s — AB (ref 11.4–15.2)

## 2017-08-17 MED ORDER — WARFARIN SODIUM 2 MG PO TABS
2.0000 mg | ORAL_TABLET | Freq: Once | ORAL | Status: AC
  Administered 2017-08-17: 2 mg via ORAL
  Filled 2017-08-17: qty 1

## 2017-08-17 MED ORDER — CEPHALEXIN 250 MG PO CAPS: 250.0000 mg | ORAL_CAPSULE | Freq: Two times a day (BID) | ORAL | 0 refills | Status: DC

## 2017-08-17 NOTE — Progress Notes (Signed)
ANTICOAGULATION CONSULT NOTE - Follow Up Consult  Pharmacy Consult for warfarin Indication: atrial fibrillation  No Known Allergies  Patient Measurements: Height: 5\' 10"  (177.8 cm) Weight: 179 lb 1.6 oz (81.2 kg) IBW/kg (Calculated) : 73  Vital Signs: Temp: 98.1 F (36.7 C) (09/12 0500) Temp Source: Oral (09/12 0500) BP: 86/61 (09/12 0500) Pulse Rate: 72 (09/12 0500)  Labs:  Recent Labs  08/15/17 0448 08/15/17 2130 08/16/17 0710 08/17/17 0709  HGB  --  9.4*  --   --   HCT  --  30.5*  --   --   PLT  --  121*  --   --   LABPROT 29.8*  --  26.4* 24.7*  INR 2.87  --  2.45 2.26  CREATININE  --  4.03*  --   --     Estimated Creatinine Clearance: 19.6 mL/min (A) (by C-G formula based on SCr of 4.03 mg/dL (H)).  Assessment: 61yo M continues on warfarin for afib, appears to be very sensitive to dose adjustments.  INR is therapeutic at 2.2 today but overall trending down. CBC has been stable. No signs or symptoms of bleeding noted.  PTA dose: 2mg  daily except 1mg  Tue/Thur  Goal of Therapy:  INR 2-3 Monitor platelets by anticoagulation protocol: Yes   Plan:  Warfarin 2 mg PO today Recheck INR on Friday   Erin Hearing PharmD., BCPS Clinical Pharmacist Pager [phone] 08/17/2017 2:28 PM

## 2017-08-17 NOTE — Progress Notes (Signed)
Patient refusing CPAP.

## 2017-08-17 NOTE — Consult Note (Signed)
Neuropsychological Consultation   Patient:   [name]   DOB:   Apr 10, 1956  MR Number:  [id number]  Location:  [facility] [address] [id number] [place] [address] Dept: [place]: [phone]           Date of Service:   08/17/2017  Start Time:   8 PM End Time:   9 PM  Provider/Observer:  Ilean Skill, Psy.D.       Clinical Neuropsychologist       Billing Code/Service: [phone] 4 Units  Chief Complaint:    [name] is a 61 year old male with history of cirrhosis, colostomy, CAD with Defibrillator, CKD Stage IV, chronic systolic congestive heart failure, DM.  Presented on 07/10/2017 with increasing bilateral lower extremity edema and increasing shortness of breath.  MSSA found in blood cultures.  Request for physical medicine and admitted for CIR program.    Reason for Service:  DARRIAN GRZELAK was referred for neuropsychological consultation due to coping issues with long hospital stay and adjustment to approaching plan for discharge.  Below is the complete HPI for the current admission.  HPI: [name] is a 61 y.o. 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 Per chart review patient lives with spouse independent prior to admission and still driving. One level home one-step entry. Presented 07/10/2017 with increasing bilateral lower extremity edema and increasing shortness of breath. Patient recently seen in urgent care 2 weeks ago for right groin pain he did receive Flexeril was given a prescription for Bactrim which she did not take. Chest x-ray showed right pleural  effusion and right lung base atelectasis versus infiltrate. Unilateral films of the pelvis showed no fracture. CT abdomen and pelvis showed cirrhosis moderate ascites. Echocardiogram with ejection fraction of 56-31% systolic function severely reduced. Blood cultures MSSA placed on Intravenous cefazolin 6 weeks until 08/21/2017 then convert to chronic suppressive oral cephalexin therapy. TEE completed showing no evidence of endocarditis and no plans to extract ICD device. Renal service follow-up for severe CKD stage IV initially requiring CRRT and latest creatinine 3.45 and presently on dialysis. Patient remains on chronic Coumadin therapy. Therapy evaluations completed an ongoing patient limited by fatigue. Request made for physical medicine rehabilitation consult.Patient was admitted for a comprehensive rehabilitation program   Current Status:  Patient reports good mood state, but a little worried about having everything ready for ongoing therapy after discharge.  Behavioral Observation: [name]  presents as a 61 y.o.-year-old Right Caucasian Male who appeared his stated age. his dress was Appropriate and he was Well Groomed and his manners were Appropriate to the situation.  his participation was indicative of Appropriate and Attentive behaviors.  There were physical disabilities noted.  he displayed an appropriate level of cooperation and motivation.     Interactions:    Active Appropriate  Attention:   within normal limits and attention span appeared a little shorter than expected for age  Memory:   within normal limits; recent and remote memory intact  Visuo-spatial:  within normal limits  Speech (Volume):  normal  Speech:   normal;   Thought Process:  Coherent and Relevant  Though Content:  WNL;   Orientation:   person, place, time/date and  situation  Judgment:   Fair  Planning:   Fair  Affect:    Anxious  Mood:    Anxious  Insight:   Good  Intelligence:   normal  Medical History:   Past Medical History:  Diagnosis Date  . Adenocarcinoma of rectum ([place])    a. 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 AICD (automatic cardioverter/defibrillator) present 2002   BI V ICD  . Anemia   . CAD (coronary artery disease)    a. BMS to LAD 2001 at [facility] b. PTCA/atherectomy ramus and BMS to LAD 2009  . CHF (congestive heart failure) ([place])   . Cholelithiasis 06/2015  . Chronic kidney disease, stage IV (severe) ([place])   . Chronic systolic heart failure ([place])   . Cirrhosis ([place])   . Colostomy in place [facility])   . Dizziness    a. chronic. Admission for this 07/18/2014  . DM type 2, uncontrolled, with renal complications ([place])   . Dysrhythmia   . Essential hypertension, benign   . GERD (gastroesophageal reflux disease)   . HCAP (healthcare-associated pneumonia) 07/21/2015  . Hematuria   . History of blood transfusion    "I've had 2 units so far this year" (09/27/2015)  . HLD (hyperlipidemia)   . Ischemic cardiomyopathy    EF 18% by nuclear study 2016, multiple myocardial infarctions in past    . Myocardial infarction ([place]) 2001  . Obesity   . Orthostatic hypotension   . OSA on CPAP   . Paroxysmal atrial fibrillation ([facility])    a. on amiodarone, digoxin and Eliquis  . PONV (postoperative nausea and vomiting)   . Prostate cancer ([place])    a. s/p seed implants with chemo and radiation  . SAH (subarachnoid hemorrhage) ([place])    post-traumatic (fall) [facility] 12/2014  . TIA (transient ischemic attack)        Family Med/Psych History:  Family History  Problem Relation Age of Onset  . Colon cancer Mother 87  . Coronary artery disease Father   . Colon cancer Sister 70  . Diabetes Sister   . Colon cancer Other        2 cousins, succumbed to illness    Risk of Suicide/Violence: low Pt denies SI or HI.  Impression/DX:  [name] is a 61 year old  male with history of cirrhosis, colostomy, CAD with Defibrillator, CKD Stage IV, chronic systolic congestive heart failure, DM.  Presented on 07/10/2017 with increasing bilateral lower extremity edema and increasing shortness of breath.  MSSA found in blood cultures.  Request for physical medicine and admitted for CIR program.  Patient was referred for neuropsychological consultation due to coping issues with long hospital stay and adjustment to approaching plan for discharge.   Patient's mood state appears good with appropriate level of apprehension for up coming discharge.  Has family support.           Electronically Signed   _______________________ Ilean Skill, Psy.D.

## 2017-08-17 NOTE — Progress Notes (Signed)
[place] PHYSICAL MEDICINE & REHABILITATION     PROGRESS NOTE  Subjective/Complaints:  Pt seen sitting up at the EOB this AM.  He slept well overnight.  He requests sharp debridement again, educated on frequency of debridement.    ROS: Denies CP, SOB, N/V/D.  Objective: Vital Signs: Blood pressure (!) 86/61, pulse 72, temperature 98.1 F (36.7 C), temperature source Oral, resp. rate 16, height 5\' 10"  (1.778 m), weight 81.2 kg (179 lb 1.6 oz), SpO2 97 %. No results found.  Recent Labs  08/15/17 2130  WBC 9.1  HGB 9.4*  HCT 30.5*  PLT 121*    Recent Labs  08/15/17 2130  NA 129*  K 3.6  CL 95*  GLUCOSE 168*  BUN 39*  CREATININE 4.03*  CALCIUM 8.6*   CBG (last 3)   Recent Labs  08/16/17 2027 08/16/17 2301 08/17/17 0640  GLUCAP 92 187* 149*    Wt Readings from Last 3 Encounters:  08/17/17 81.2 kg (179 lb 1.6 oz)  08/02/17 70.2 kg (154 lb 12.2 oz)  06/21/17 79.2 kg (174 lb 8 oz)    Physical Exam:  BP (!) 86/61 (BP Location: Left Arm)   Pulse 72   Temp 98.1 F (36.7 C) (Oral)   Resp 16   Ht 5\' 10"  (1.778 m)   Wt 81.2 kg (179 lb 1.6 oz)   SpO2 97%   BMI 25.70 kg/m  Constitutional: No distress. Vital signs reviewed.  HENT: Normocephalic and atraumatic.  Eyes: EOM are normal. No discharge.  Cardiovascular: Cardiac rate controlled. No JVD. Respiratory: Effort normal and breath sounds normal.  GI: Bowel sounds are normal. He exhibits no distension.  Colostomy in place . Musc: No edema. No tenderness Neurological: Alert.  Motor: b/l UE 4/5 deltoid, biceps, triceps, wrist ext, HI (unchanged).  LLE: 4-/5 HF and 4+/5 KE and 5/5 ADF/PF.  [name]: 3-/5 HF, 4-/5 KE and 4+/5 ADF/PF.  Skin: Multiple wounds. Sacral ulcer unstageable, not examined today.  Psych: flat, cooperative   Assessment/Plan: 1. Functional deficits secondary to debility which require 3+ hours per day of interdisciplinary therapy in a comprehensive inpatient rehab setting. Physiatrist is  providing close team supervision and 24 hour management of active medical problems listed below. Physiatrist and rehab team continue to assess barriers to discharge/monitor patient progress toward functional and medical goals.  Function:  Bathing Bathing position   Position: Wheelchair/chair at sink  Bathing parts Body parts bathed by patient: Right arm, Left arm, Chest, Abdomen, Front perineal area, Buttocks, Right upper leg, Left upper leg Body parts bathed by helper: Back  Bathing assist Assist Level: Supervision or verbal cues      Upper Body Dressing/Undressing Upper body dressing   What is the patient wearing?: Pull over shirt/dress     Pull over shirt/dress - Perfomed by patient: Thread/unthread right sleeve, Thread/unthread left sleeve, Put head through opening, Pull shirt over trunk          Upper body assist Assist Level: Set up   Set up : To obtain clothing/put away  Lower Body Dressing/Undressing Lower body dressing   What is the patient wearing?: Underwear, Pants Underwear - Performed by patient: Thread/unthread right underwear leg, Thread/unthread left underwear leg, Pull underwear up/down Underwear - Performed by helper: Thread/unthread left underwear leg Pants- Performed by patient: Thread/unthread right pants leg, Thread/unthread left pants leg, Pull pants up/down, Fasten/unfasten pants Pants- Performed by helper: Thread/unthread right pants leg   Non-skid slipper socks- Performed by helper: Don/doff right sock, Don/doff  left sock                  Lower body assist Assist for lower body dressing: Supervision or verbal cues   Set up : To obtain clothing/put away  Toileting Toileting Toileting activity did not occur: No continent bowel/bladder event   Toileting steps completed by helper: Adjust clothing prior to toileting, Performs perineal hygiene, Adjust clothing after toileting (per Elmo Putt, NT report)    Toileting assist      Transfers Chair/bed transfer   Chair/bed transfer method: Ambulatory Chair/bed transfer assist level: Supervision or verbal cues Chair/bed transfer assistive device: Armrests, Medical sales representative     Max distance: 150 Assist level: Supervision or verbal cues   Wheelchair   Type: Manual Max wheelchair distance: 186ft  Assist Level: Supervision or verbal cues  Cognition Comprehension Comprehension assist level: Follows complex conversation/direction with extra time/assistive device  Expression Expression assist level: Expresses basic needs/ideas: With no assist  Social Interaction Social Interaction assist level: Interacts appropriately with others - No medications needed.  Problem Solving Problem solving assist level: Solves basic 75 - 89% of the time/requires cueing 10 - 24% of the time  Memory Memory assist level: Recognizes or recalls 90% of the time/requires cueing < 10% of the time    Medical Problem List and Plan:  1. Severe debilitation/encephalopathy secondary to respiratory failure, CHF, MSSA sepsis   Cont CIR 2. DVT Prophylaxis/Anticoagulation: Chronic Coumadin therapy and monitor for rebleeding episodes   INR therpeutic on 9/12 3. Pain Management: Hydrocodone as needed  4. Mood: Provide emotional support  5. Neuropsych: This patient is capable of making decisions on his own behalf.  6. Skin/Wound Care/lower extremity leg wound: Routine skin checks with skin care as directed   Appreciate WOC recs  Debrided 9/10 7. Fluids/Electrolytes/Nutrition: Routine I&Os 8. CKD stage IV. Followed closely by renal services.   On dialysis, appreciate nephro recs, aggressively diuresing as tolerated   Cont to monitor 9. MSSA bacteremia. Intravenous cefazolin through 08/21/2017 then chronic Keflex and follow-up per infectious disease  10. 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 Routine colostomy care  11. CAD with implantable defibrillator.  Follow-up cardiology services. No plan to extract device  12. Chronic systolic congestive heart failure/ischemic cardiomyopathy. Monitor for any signs of fluid overload.  Filed Weights   08/16/17 1545 08/16/17 1956 08/17/17 0500  Weight: 84 kg (185 lb 3 oz) 81 kg (178 lb 9.2 oz) 81.2 kg (179 lb 1.6 oz)  13. Diabetes mellitus and peripheral neuropathy.   Lantus insulin 10 decreased to 6U due to hypoglycemia on 9/1, Increased to 8 units on 9/2  Relatively controlled 9/12  Will consider further adjustments if necessary 14. Orthostasis. Continue ProAmatine 10 mg 3 times a day.  Controlled 15. Acute on chronic anemia. Continue Aranesp.   Hb 9.4 on 9/10  Labs with HD 16. History of gout. Colchicine 0.3 mg daily. Monitor for any gout flareups  17. Malnourishment  Supplement initiated 8/29 18. Thrombocytopenia  Plts 121 on 9/10  Labs with HD  Cont to monitor  LOS (Days) 15 A FACE TO FACE EVALUATION WAS PERFORMED  Ankit Lorie Phenix 08/17/2017 8:32 AM

## 2017-08-17 NOTE — Progress Notes (Signed)
  [facility] Progress Note   Assessment/ Plan:    1. MSSA bacteremia on Ancef though 9/15 and then PO cefalexein. 2. ESRD: Continue hemodialysis via right brachiocephalic fistula. Initially wanted to go to [facility] he expressed that he wants to do [facility].  Have sent papers.  Have given serial HD treatments for vol overload.  This is coming under better control so will stop serial HD treatments, hold HD for today and resume tomorrow.  3. Deconditioning: rehabilitation in CIR 4. Afib: on warfarn 5.  H/o rectal cancer: diverting ostomy 6.  Acute on chronic systolic CHF: EF 19-14%.  St Jude BiV ICD in place.  Per cardiology, no plans for lead extraction due to comorbids.  Subjective:    Feeling tired but good today.  Vol status improved.     Objective:   BP (!) 86/61 (BP Location: Left Arm)   Pulse 72   Temp 98.1 F (36.7 C) (Oral)   Resp 16   Ht 5\' 10"  (1.778 m)   Wt 81.2 kg (179 lb 1.6 oz)   SpO2 97%   BMI 25.70 kg/m   Physical Exam:   GEN NAD HEENT EOMI, perrl NECK JVD improved PULM clear  CV RRR ABD mildly distended, ostomy EXT wrapped, 1+ LE edema, improving NEURO AAO x3  Labs: BMET  Recent Labs Lab 08/10/17 1530 08/10/17 2200 08/12/17 1600 08/13/17 1429 08/15/17 2130  NA 128* 135 127* 130* 129*  K 4.1 3.3* 4.0 3.2* 3.6  CL 91* 96* 91* 94* 95*  CO2 25 29 24 26 24   GLUCOSE 168* 93 119* 221* 168*  BUN 40* 16 42* 25* 39*  CREATININE 3.87* 2.11* 3.86* 2.66* 4.03*  CALCIUM 8.6* 8.3* 8.7* 8.2* 8.6*  PHOS 3.2 1.9* 2.9 1.9* 3.6   CBC  Recent Labs Lab 08/10/17 1530 08/12/17 1600 08/13/17 1430 08/15/17 2130  WBC 8.5 9.6 9.6 9.1  HGB 8.9* 9.2* 9.0* 9.4*  HCT 28.9* 28.8* 29.6* 30.5*  MCV 97.0 97.3 99.3 98.4  PLT 110* 132* 110* 121*    @IMGRELPRIORS @ Medications:    . colchicine  0.3 mg Oral Daily  . collagenase  1 application Topical Daily  . darbepoetin (ARANESP) injection - NON-DIALYSIS  200 mcg  Subcutaneous Q Wed-1800  . feeding supplement (ENSURE ENLIVE)  237 mL Oral Q1500  . feeding supplement (PRO-STAT SUGAR FREE 64)  30 mL Oral BID  . hydrocerin   Topical BID  . insulin aspart  0-5 Units Subcutaneous QHS  . insulin aspart  3 Units Subcutaneous TID WC  . insulin glargine  8 Units Subcutaneous QHS  . loratadine  10 mg Oral Daily  . midodrine  10 mg Oral TID WC  . multivitamin  1 tablet Oral QHS  . sucroferric oxyhydroxide  500 mg Oral TID WC  . Warfarin - Pharmacist Dosing Inpatient   Does not apply q1800     Madelon Lips MD [facility] pgr [phone] 08/17/2017, 12:41 PM

## 2017-08-18 LAB — RENAL FUNCTION PANEL
ALBUMIN: 1.9 g/dL — AB (ref 3.5–5.0)
Anion gap: 9 (ref 5–15)
BUN: 33 mg/dL — AB (ref 6–20)
CO2: 25 mmol/L (ref 22–32)
CREATININE: 3.05 mg/dL — AB (ref 0.61–1.24)
Calcium: 8.7 mg/dL — ABNORMAL LOW (ref 8.9–10.3)
Chloride: 95 mmol/L — ABNORMAL LOW (ref 101–111)
GFR calc Af Amer: 24 mL/min — ABNORMAL LOW (ref >60)
GFR, EST NON AFRICAN AMERICAN: 20 mL/min — AB (ref >60)
Glucose, Bld: 147 mg/dL — ABNORMAL HIGH (ref 65–99)
PHOSPHORUS: 3 mg/dL (ref 2.5–4.6)
Potassium: 4 mmol/L (ref 3.5–5.1)
Sodium: 129 mmol/L — ABNORMAL LOW (ref 135–145)

## 2017-08-18 LAB — CBC
HCT: 31 % — ABNORMAL LOW (ref 39.0–52.0)
Hemoglobin: 9.6 g/dL — ABNORMAL LOW (ref 13.0–17.0)
MCH: 30.7 pg (ref 26.0–34.0)
MCHC: 31 g/dL (ref 30.0–36.0)
MCV: 99 fL (ref 78.0–100.0)
PLATELETS: 126 10*3/uL — AB (ref 150–400)
RBC: 3.13 MIL/uL — ABNORMAL LOW (ref 4.22–5.81)
RDW: 21.1 % — ABNORMAL HIGH (ref 11.5–15.5)
WBC: 8.9 10*3/uL (ref 4.0–10.5)

## 2017-08-18 LAB — GLUCOSE, CAPILLARY
GLUCOSE-CAPILLARY: 165 mg/dL — AB (ref 65–99)
GLUCOSE-CAPILLARY: 89 mg/dL (ref 65–99)
GLUCOSE-CAPILLARY: 87 mg/dL (ref 65–99)
Glucose-Capillary: 126 mg/dL — ABNORMAL HIGH (ref 65–99)

## 2017-08-18 MED ORDER — WARFARIN SODIUM 1 MG PO TABS
1.0000 mg | ORAL_TABLET | Freq: Once | ORAL | Status: AC
  Administered 2017-08-18: 1 mg via ORAL
  Filled 2017-08-18: qty 1

## 2017-08-18 NOTE — Progress Notes (Signed)
[place] PHYSICAL MEDICINE & REHABILITATION     PROGRESS NOTE  Subjective/Complaints:  Patient seen sitting up in bed this morning eating breakfast. States she slept well overnight. He notes that he is going to continue to try to stay active and ambulate with nursing.  ROS: Denies CP, SOB, N/V/D.  Objective: Vital Signs: Blood pressure (!) 96/56, pulse 70, temperature 97.7 F (36.5 C), temperature source Oral, resp. rate 16, height 5\' 10"  (1.778 m), weight 80 kg (176 lb 5.9 oz), SpO2 99 %. No results found.  Recent Labs  08/15/17 2130  WBC 9.1  HGB 9.4*  HCT 30.5*  PLT 121*    Recent Labs  08/15/17 2130  NA 129*  K 3.6  CL 95*  GLUCOSE 168*  BUN 39*  CREATININE 4.03*  CALCIUM 8.6*   CBG (last 3)   Recent Labs  08/17/17 1628 08/17/17 2044 08/18/17 0605  GLUCAP 102* 125* 165*    Wt Readings from Last 3 Encounters:  08/18/17 80 kg (176 lb 5.9 oz)  08/02/17 70.2 kg (154 lb 12.2 oz)  06/21/17 79.2 kg (174 lb 8 oz)    Physical Exam:  BP (!) 96/56 (BP Location: Left Arm)   Pulse 70   Temp 97.7 F (36.5 C) (Oral)   Resp 16   Ht 5\' 10"  (1.778 m)   Wt 80 kg (176 lb 5.9 oz)   SpO2 99%   BMI 25.31 kg/m  Constitutional: No distress. Vital signs reviewed.  HENT: Normocephalic and atraumatic.  Eyes: EOM are normal. No discharge.  Cardiovascular: Cardiac rate controlled. No JVD. Respiratory: Effort normal and breath sounds normal.  GI: Bowel sounds are normal. He exhibits no distension.  Colostomy in place . Musc: No edema. No tenderness Neurological: Alert.  Motor: b/l UE 4/5 deltoid, biceps, triceps, wrist ext, HI (unchanged).  LLE: 4-/5 HF and 4+/5 KE and 5/5 ADF/PF.  [name]: 3-/5 HF, 4-/5 KE and 4+/5 ADF/PF (stable). Skin: Multiple wounds. Sacral ulcer unstageable, not examined today.  Psych: flat, cooperative   Assessment/Plan: 1. Functional deficits secondary to debility which require 3+ hours per day of interdisciplinary therapy in a comprehensive  inpatient rehab setting. Physiatrist is providing close team supervision and 24 hour management of active medical problems listed below. Physiatrist and rehab team continue to assess barriers to discharge/monitor patient progress toward functional and medical goals.  Function:  Bathing Bathing position   Position: Wheelchair/chair at sink  Bathing parts Body parts bathed by patient: Right arm, Left arm, Chest, Abdomen, Front perineal area, Buttocks, Right upper leg, Left upper leg Body parts bathed by helper: Back  Bathing assist Assist Level: Supervision or verbal cues      Upper Body Dressing/Undressing Upper body dressing   What is the patient wearing?: Pull over shirt/dress     Pull over shirt/dress - Perfomed by patient: Thread/unthread right sleeve, Thread/unthread left sleeve, Put head through opening, Pull shirt over trunk          Upper body assist Assist Level: Set up   Set up : To obtain clothing/put away  Lower Body Dressing/Undressing Lower body dressing   What is the patient wearing?: Underwear, Pants Underwear - Performed by patient: Thread/unthread right underwear leg, Thread/unthread left underwear leg, Pull underwear up/down Underwear - Performed by helper: Thread/unthread left underwear leg Pants- Performed by patient: Thread/unthread right pants leg, Thread/unthread left pants leg, Pull pants up/down, Fasten/unfasten pants Pants- Performed by helper: Thread/unthread right pants leg   Non-skid slipper socks- Performed by  helper: Don/doff right sock, Don/doff left sock                  Lower body assist Assist for lower body dressing: Supervision or verbal cues   Set up : To obtain clothing/put away  Toileting Toileting Toileting activity did not occur: No continent bowel/bladder event   Toileting steps completed by helper: Adjust clothing prior to toileting, Performs perineal hygiene, Adjust clothing after toileting (per Elmo Putt, NT report)     Toileting assist     Transfers Chair/bed transfer   Chair/bed transfer method: Ambulatory Chair/bed transfer assist level: Supervision or verbal cues Chair/bed transfer assistive device: Armrests, Medical sales representative     Max distance: 150 Assist level: Supervision or verbal cues   Wheelchair   Type: Manual Max wheelchair distance: 170ft  Assist Level: Supervision or verbal cues  Cognition Comprehension Comprehension assist level: Follows complex conversation/direction with extra time/assistive device  Expression Expression assist level: Expresses basic needs/ideas: With no assist  Social Interaction Social Interaction assist level: Interacts appropriately with others - No medications needed.  Problem Solving Problem solving assist level: Solves basic 75 - 89% of the time/requires cueing 10 - 24% of the time  Memory Memory assist level: Recognizes or recalls 90% of the time/requires cueing < 10% of the time    Medical Problem List and Plan:  1. Severe debilitation/encephalopathy secondary to respiratory failure, CHF, MSSA sepsis   Will plan to discharge after completion of IV ABX 2. DVT Prophylaxis/Anticoagulation: Chronic Coumadin therapy and monitor for rebleeding episodes   INR therpeutic on 9/13 3. Pain Management: Hydrocodone as needed  4. Mood: Provide emotional support  5. Neuropsych: This patient is capable of making decisions on his own behalf.  6. Skin/Wound Care/lower extremity leg wound: Routine skin checks with skin care as directed   Appreciate WOC recs  Debrided 9/10 7. Fluids/Electrolytes/Nutrition: Routine I&Os 8. CKD stage IV. Followed closely by renal services.   On dialysis, appreciate nephro recs, aggressively diuresing as tolerated, tolerating at present  Cont to monitor 9. MSSA bacteremia. Intravenous cefazolin through 08/21/2017 then chronic Keflex and follow-up per infectious disease  10. 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 Routine colostomy care  11. CAD with implantable defibrillator. Follow-up cardiology services. No plan to extract device  12. Chronic systolic congestive heart failure/ischemic cardiomyopathy. Monitor for any signs of fluid overload.  Filed Weights   08/16/17 1956 08/17/17 0500 08/18/17 0500  Weight: 81 kg (178 lb 9.2 oz) 81.2 kg (179 lb 1.6 oz) 80 kg (176 lb 5.9 oz)  13. Diabetes mellitus and peripheral neuropathy.   Lantus insulin 10 decreased to 6U due to hypoglycemia on 9/1, Increased to 8 units on 9/2  Relatively controlled 9/13  Will consider further adjustments if necessary 14. Orthostasis. Continue ProAmatine 10 mg 3 times a day.  Controlled 15. Acute on chronic anemia. Continue Aranesp.   Hb 9.4 on 9/10  Labs with HD 16. History of gout. Colchicine 0.3 mg daily. Monitor for any gout flareups  17. Malnourishment  Supplement initiated 8/29 18. Thrombocytopenia  Plts 121 on 9/10  Labs with HD  Cont to monitor  LOS (Days) 16 A FACE TO FACE EVALUATION WAS PERFORMED  Rosio Weiss Lorie Phenix 08/18/2017 12:11 PM

## 2017-08-18 NOTE — Progress Notes (Signed)
  [facility] Progress Note   Assessment/ Plan:    1. MSSA bacteremia on Ancef though 9/15 and then PO cefalexein. 2. ESRD: Continue hemodialysis via right brachiocephalic fistula. Initially wanted to go to [facility] he expressed that he wants to do [facility].  Have sent papers.  Have given serial HD treatments for vol overload, now on TTS treatment. 3. Deconditioning: rehabilitation in CIR 4. Afib: on warfarn 5.  H/o rectal cancer: diverting ostomy 6.  Acute on chronic systolic CHF: EF 89-38%.  St Jude BiV ICD in place.  Per cardiology, no plans for lead extraction due to comorbids.  Subjective:    Doing OK.     Objective:   BP (!) 96/56 (BP Location: Left Arm)   Pulse 70   Temp 97.7 F (36.5 C) (Oral)   Resp 16   Ht 5\' 10"  (1.778 m)   Wt 80 kg (176 lb 5.9 oz)   SpO2 99%   BMI 25.31 kg/m   Physical Exam:   GEN NAD HEENT EOMI, perrl NECK JVD improved PULM clear  CV RRR ABD mildly distended, ostomy EXT wrapped, 1+ LE edema, improving NEURO AAO x3  Labs: BMET  Recent Labs Lab 08/12/17 1600 08/13/17 1429 08/15/17 2130  NA 127* 130* 129*  K 4.0 3.2* 3.6  CL 91* 94* 95*  CO2 24 26 24   GLUCOSE 119* 221* 168*  BUN 42* 25* 39*  CREATININE 3.86* 2.66* 4.03*  CALCIUM 8.7* 8.2* 8.6*  PHOS 2.9 1.9* 3.6   CBC  Recent Labs Lab 08/12/17 1600 08/13/17 1430 08/15/17 2130  WBC 9.6 9.6 9.1  HGB 9.2* 9.0* 9.4*  HCT 28.8* 29.6* 30.5*  MCV 97.3 99.3 98.4  PLT 132* 110* 121*    @IMGRELPRIORS @ Medications:    . colchicine  0.3 mg Oral Daily  . collagenase  1 application Topical Daily  . darbepoetin (ARANESP) injection - NON-DIALYSIS  200 mcg Subcutaneous Q Wed-1800  . feeding supplement (ENSURE ENLIVE)  237 mL Oral Q1500  . feeding supplement (PRO-STAT SUGAR FREE 64)  30 mL Oral BID  . hydrocerin   Topical BID  . insulin aspart  0-5 Units Subcutaneous QHS  . insulin aspart  3 Units Subcutaneous TID WC  . insulin  glargine  8 Units Subcutaneous QHS  . loratadine  10 mg Oral Daily  . midodrine  10 mg Oral TID WC  . multivitamin  1 tablet Oral QHS  . sucroferric oxyhydroxide  500 mg Oral TID WC  . Warfarin - Pharmacist Dosing Inpatient   Does not apply q1800     Madelon Lips MD [facility] pgr [phone] 08/18/2017, 11:51 AM

## 2017-08-18 NOTE — Procedures (Signed)
Patient seen and examined on Hemodialysis. QB 300 3L net neg.  Tolerating treatment well.  Treatment adjusted as needed.  Madelon Lips MD [facility] pgr [phone] 11:54 AM

## 2017-08-19 LAB — RENAL FUNCTION PANEL
ALBUMIN: 2.1 g/dL — AB (ref 3.5–5.0)
ANION GAP: 8 (ref 5–15)
BUN: 25 mg/dL — ABNORMAL HIGH (ref 6–20)
CO2: 30 mmol/L (ref 22–32)
Calcium: 8.8 mg/dL — ABNORMAL LOW (ref 8.9–10.3)
Chloride: 93 mmol/L — ABNORMAL LOW (ref 101–111)
Creatinine, Ser: 2.61 mg/dL — ABNORMAL HIGH (ref 0.61–1.24)
GFR calc non Af Amer: 25 mL/min — ABNORMAL LOW (ref >60)
GFR, EST AFRICAN AMERICAN: 29 mL/min — AB (ref >60)
GLUCOSE: 176 mg/dL — AB (ref 65–99)
PHOSPHORUS: 3 mg/dL (ref 2.5–4.6)
POTASSIUM: 3.2 mmol/L — AB (ref 3.5–5.1)
SODIUM: 131 mmol/L — AB (ref 135–145)

## 2017-08-19 LAB — GLUCOSE, CAPILLARY
GLUCOSE-CAPILLARY: 85 mg/dL (ref 65–99)
GLUCOSE-CAPILLARY: 185 mg/dL — AB (ref 65–99)
GLUCOSE-CAPILLARY: 90 mg/dL (ref 65–99)
GLUCOSE-CAPILLARY: 100 mg/dL — AB (ref 65–99)

## 2017-08-19 LAB — CBC
HEMATOCRIT: 31.7 % — AB (ref 39.0–52.0)
HEMOGLOBIN: 9.8 g/dL — AB (ref 13.0–17.0)
MCH: 30.8 pg (ref 26.0–34.0)
MCHC: 30.9 g/dL (ref 30.0–36.0)
MCV: 99.7 fL (ref 78.0–100.0)
Platelets: 126 10*3/uL — ABNORMAL LOW (ref 150–400)
RBC: 3.18 MIL/uL — AB (ref 4.22–5.81)
RDW: 21.5 % — ABNORMAL HIGH (ref 11.5–15.5)
WBC: 8.1 10*3/uL (ref 4.0–10.5)

## 2017-08-19 LAB — PROTIME-INR
INR: 2.34
Prothrombin Time: 25.4 seconds — ABNORMAL HIGH (ref 11.4–15.2)

## 2017-08-19 MED ORDER — DARBEPOETIN ALFA 100 MCG/0.5ML IJ SOSY
100.0000 ug | PREFILLED_SYRINGE | INTRAMUSCULAR | Status: DC
  Administered 2017-08-20: 100 ug via INTRAVENOUS
  Filled 2017-08-19: qty 0.5

## 2017-08-19 MED ORDER — MIDODRINE HCL 10 MG PO TABS: 10.0000 mg | ORAL_TABLET | Freq: Three times a day (TID) | ORAL | 0 refills | Status: DC

## 2017-08-19 MED ORDER — WARFARIN SODIUM 1 MG PO TABS: 1.0000 mg | ORAL_TABLET | Freq: Every day | ORAL | 0 refills | Status: DC

## 2017-08-19 MED ORDER — SUCROFERRIC OXYHYDROXIDE 500 MG PO CHEW: 500.0000 mg | CHEWABLE_TABLET | Freq: Three times a day (TID) | ORAL | 0 refills | Status: AC

## 2017-08-19 MED ORDER — GLUCOSE BLOOD VI STRP: ORAL_STRIP | 4 refills | Status: AC

## 2017-08-19 MED ORDER — HYDROCODONE-ACETAMINOPHEN 7.5-325 MG PO TABS: 1.0000 | ORAL_TABLET | ORAL | 0 refills | Status: DC | PRN

## 2017-08-19 MED ORDER — INSULIN ASPART 100 UNIT/ML FLEXPEN: 3.0000 [IU] | PEN_INJECTOR | Freq: Three times a day (TID) | SUBCUTANEOUS | 11 refills | Status: DC

## 2017-08-19 MED ORDER — NITROGLYCERIN 0.4 MG SL SUBL: 0.4000 mg | SUBLINGUAL_TABLET | SUBLINGUAL | 3 refills | Status: AC | PRN

## 2017-08-19 MED ORDER — COLCHICINE 0.6 MG PO TABS: 0.3000 mg | ORAL_TABLET | Freq: Every day | ORAL | 0 refills | Status: DC

## 2017-08-19 MED ORDER — RENA-VITE PO TABS: 1.0000 | ORAL_TABLET | Freq: Every day | ORAL | 0 refills | Status: DC

## 2017-08-19 MED ORDER — COLLAGENASE 250 UNIT/GM EX OINT: TOPICAL_OINTMENT | Freq: Every day | CUTANEOUS | 0 refills | Status: DC

## 2017-08-19 MED ORDER — SILVER NITRATE-POT NITRATE 75-25 % EX MISC
1.0000 "application " | CUTANEOUS | Status: DC | PRN
  Filled 2017-08-19: qty 1

## 2017-08-19 MED ORDER — INSULIN GLARGINE 100 UNIT/ML SOLOSTAR PEN: 8.0000 [IU] | PEN_INJECTOR | Freq: Every day | SUBCUTANEOUS | 11 refills | Status: DC

## 2017-08-19 MED ORDER — PANTOPRAZOLE SODIUM 40 MG PO TBEC: 40.0000 mg | DELAYED_RELEASE_TABLET | Freq: Every day | ORAL | 0 refills | Status: AC

## 2017-08-19 MED ORDER — WARFARIN SODIUM 1 MG PO TABS
1.0000 mg | ORAL_TABLET | Freq: Every day | ORAL | Status: DC
  Administered 2017-08-19: 1 mg via ORAL
  Filled 2017-08-19 (×2): qty 1

## 2017-08-19 MED ORDER — LORATADINE 10 MG PO TABS: 10.0000 mg | ORAL_TABLET | Freq: Every day | ORAL | 0 refills | Status: DC

## 2017-08-19 MED ORDER — CEPHALEXIN 250 MG PO CAPS: 250.0000 mg | ORAL_CAPSULE | Freq: Two times a day (BID) | ORAL | 0 refills | Status: DC

## 2017-08-19 MED ORDER — CEPHALEXIN 250 MG PO CAPS
250.0000 mg | ORAL_CAPSULE | Freq: Two times a day (BID) | ORAL | Status: DC
  Administered 2017-08-19 (×2): 250 mg via ORAL
  Filled 2017-08-19 (×2): qty 1

## 2017-08-19 NOTE — Progress Notes (Signed)
Social Work Patient ID: [name], male   DOB: [birth year], 61 y.o.   MRN: [id number]   [name], [name] Dates, LCSW Social Worker Signed   Patient Care Conference Date of Service: 08/19/2017  1:20 AM      Hide copied text Hover for attribution information Inpatient RehabilitationTeam Conference and Plan of Care Update Date: 08/17/2017   Time: 2:50 PM      Patient Name: [name]      Medical Record Number: [id number]  Date of Birth: 14-Apr-1956 Sex: Male         Room/Bed: 4W19C/4W19C-01 Payor Info: Payor: MEDICARE / Plan: MEDICARE PART A AND B / Product Type: *No Product type* /     Admitting Diagnosis: Debility  Admit Date/Time:  08/02/2017  5:24 PM Admission Comments: No comment available    Primary Diagnosis:  <principal problem not specified> Principal Problem: <principal problem not specified>       Patient Active Problem List    Diagnosis Date Noted  . Hypoglycemia    . Severe protein-calorie malnutrition (HCC)    . Acute blood loss anemia    . Type 2 diabetes mellitus with peripheral neuropathy (HCC)    . Chronic systolic heart failure (HCC)    . Colostomy care (HCC)    . Chronic kidney disease (CKD), stage IV (severe) (HCC)    . Dependence on renal dialysis (HCC)    . Wounds, multiple    . Supratherapeutic INR    . MSSA bacteremia 08/02/2017  . ESRD (end stage renal disease) (HCC)    . Debility    . Acute on chronic systolic CHF (congestive heart failure) (HCC)    . Bacteremia due to methicillin susceptible Staphylococcus aureus (MSSA)    . Infected defibrillator (HCC)    . Septic hip (HCC)    . Staphylococcus aureus bacteremia with sepsis (HCC)    . Severe sepsis with septic shock (HCC)    . Cardiogenic shock (HCC) 07/10/2017  . Septic shock (HCC) 07/10/2017  . Cardiorenal syndrome with renal failure    . Thrombocytopenia (HCC) 06/14/2017  . GI bleed 04/27/2017  . Portal hypertensive gastropathy (HCC) 04/27/2017  . Hypokalemia 04/27/2017  . Esophageal  dysphagia 04/13/2017  . Hepatic cirrhosis (HCC) 02/24/2017  . Acute on chronic systolic heart failure (HCC)    . Biliary disease with obstruction    . AKI (acute kidney injury) (HCC)    . Abnormal LFTs 05/10/2016  . Pain in the chest    . Right heart failure (HCC) 09/26/2015  . History of colonic polyps 09/24/2015  . Chronic atrial fibrillation (HCC)    . Bleeding from colostomy stoma (HCC) 08/23/2015  . Anemia of chronic disease 07/21/2015  . Constipation 05/01/2015  . Transaminitis 05/01/2015  . NSTEMI- 04/23/15- Myoview scar- no cath 04/27/2015  . Acute respiratory failure (HCC) 04/23/2015  . Chronic cholecystitis 04/04/2015  . Cystitis with hematuria-April 2016 04/04/2015  . Chronically elevated transaminase level-Amiodarone resumed 04/24/15 04/03/2015  . H/O ventricular fibrillation-March 2016 02/24/2015  . BiV ICD (St Jude) gen change 04/10/15 02/24/2015  . Hyperlipidemia    . Dietary noncompliance    . Orthostatic hypotension 07/19/2014  . OSA on CPAP    . GERD (gastroesophageal reflux disease) 06/05/2014  . Gallbladder polyp 03/06/2014  . DM type 2, uncontrolled, with renal complications (HCC) 01/27/2014  . Dizziness 01/26/2014  . Obesity 12/12/2013  . Chronic hypotension 12/11/2013  . Chronic kidney disease, stage IV (severe) (HCC) 12/11/2013  . Chronic a-fib (  HCC)    . CAD with LCX disease and stents to LAD 07/10/2013  . Long term current use of anticoagulant therapy 03/23/2013  . History of rectal cancer    . Ischemic cardiomyopathy-EF 35% 04/24/15 echo        Expected Discharge Date: Expected Discharge Date: 08/20/17   Team Members Present: Physician leading conference: Dr. Maryla Morrow Social Worker Present: Staci Acosta, LCSW Nurse Present: Carlean Purl, RN PT Present: Wanda Plump, PT OT Present: Rosalio Loud, OT SLP Present: Jackalyn Lombard, SLP PPS Coordinator present : Edson Snowball, PT       Current Status/Progress Goal Weekly Team Focus  Medical      Severe debilitation/encephalopathy secondary to respiratory failure, CHF, MSSA sepsis   Wound care, bacteremia  See above   Bowel/Bladder     HD -Oliguric [place] self care     Assess QS and prn    Swallow/Nutrition/ Hydration               ADL's     supervision overall  supervision overall  d/c from therapy.  provided with HEP for continued activity while completing meds   Mobility     supervision overall  supervision overall  d/c from therapy due to goals being met, family ed and d/c planning completed   Communication               Safety/Cognition/ Behavioral Observations             Pain     Norco for c;o sacral pain rating 9/10 f/u 4-5/10 with meds  pain sore maintain < 3  Assess QS and prn    Skin     debridement to  scaraal area on 08/15/17  free of skin breakdown and infection        Rehab Goals Rehab Goals Revised: none *See Care Plan and progress notes for long and short-term goals.      Barriers to Discharge   Current Status/Progress Possible Resolutions Date Resolved   Physician     Medical stability;IV antibiotics;Wound Care;Hemodialysis     See above  Coumadin, wound care, neprho recs, optimize DM meds      Nursing                 PT                    OT                 SLP            SW              Discharge Planning/Teaching Needs:  Pt to return to his home with his wife and extended family to assist pt.  Family education completed with wife.   Team Discussion:  Dr. Allena Katz is making some diabetic medication adjustments and pt is completing IV antibiotics in a few more days and will then switch to p.o. Meds.  Dr. Allena Katz also did wound debridement and RN has taught wife how to do dressing changes.  Wounds are smaller, pt voids occasionally, laisx was d/c'd, pt is independently managing his colostomy.  Pt with low BPs, but feels fine.  Pt met long term PT/OT goals.  Walked 150' with RW and did 4 stairs; is using leg lifter.  PT/OT family education is  completed and pt is overall supervision and has exercises to complete in the room as he awaits medical stability.  Revisions to  Treatment Plan:  Therapy completed.  Awaiting end of IV antibiotics for medical stability.    Continued Need for Acute Rehabilitation Level of Care: The patient requires daily medical management by a physician with specialized training in physical medicine and rehabilitation for the following conditions: Daily direction of a multidisciplinary physical rehabilitation program to ensure safe treatment while eliciting the highest outcome that is of practical value to the patient.: Yes Daily medical management of patient stability for increased activity during participation in an intensive rehabilitation regime.: Yes Daily analysis of laboratory values and/or radiology reports with any subsequent need for medication adjustment of medical intervention for : Cardiac problems;Pulmonary problems;Diabetes problems;Renal problems;Wound care problems;Other   Shevy Yaney, [place] 08/19/2017, 1:21 AM

## 2017-08-19 NOTE — Discharge Summary (Signed)
NAMESHERILL, WEGENER                ACCOUNT NO.:  [id number]  MEDICAL RECORD NO.:  [id number]  LOCATION:  4W19C                        FACILITY:  [facility]  PHYSICIAN:  Delice Lesch, MD        DATE OF BIRTH:  06/29/1956  DATE OF ADMISSION:  08/02/2017 DATE OF DISCHARGE:  08/20/2017                              DISCHARGE SUMMARY   DISCHARGE DIAGNOSES: 1. Severe debilitation and encephalopathy secondary to respiratory     failure, methicillin-susceptible Staphylococcus aureus, sepsis. 2. Chronic Coumadin therapy. 3. Pain management. 4. Chronic kidney disease stage 4 with hemodialysis. 5. Methicillin-susceptible Staphylococcus aureus bacteremia. 6. 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 7. Coronary artery disease with implantable defibrillator. 8. Chronic systolic congestive heart failure. 9. Diabetes mellitus. 10.Peripheral neuropathy. 11.Orthostasis. 12.Acute on chronic anemia. 13.Thrombocytopenia. 14.Sacral skin sore.  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  Lives with spouse, independent prior to admission. Presented on July 10, 2017, with increasing bilateral lower extremity edema, shortness of breath.  Recently seen in urgent care for right groin pain.  He did receive Flexeril, prescription for Bactrim.  Chest x- ray showed right pleural effusion, right lung base atelectasis versus infiltrate.  Pelvic films, no fracture.  CT of abdomen showed cirrhosis, moderate ascites.  Echocardiogram with ejection fraction of 65-78%, systolic function severely reduced.  Blood cultures, MSSA, placed on intravenous cefazolin x6 weeks until August 20, 2017, then convert to chronic suppressive oral cephalexin therapy.   TEE completed showing no evidence of endocarditis.  No plans to extract ICD device.  Renal Service followup for severe CKD stage 4.  Ultimately placed on dialysis. He continued on chronic Coumadin.  Physical and occupational therapy ongoing.  The patient was admitted for comprehensive rehab program.  PAST MEDICAL HISTORY:  See discharge diagnoses.  SOCIAL HISTORY:  Lives with wife, independent still driving prior to admission.  FUNCTIONAL STATUS UPON ADMISSION TO REHAB SERVICES:  Moderate assist 12 feet using a walker, minimal assist sit to stand, max to total assist activities of daily living.  PHYSICAL EXAMINATION:  VITAL SIGNS:  Blood pressure 89/54, pulse 77, temperature 97, and respirations 20. GENERAL:  Alert male, in no acute distress. HEENT:  EOMs intact. NECK:  Supple, nontender.  No JVD. CARDIAC:  Rate controlled. ABDOMEN:  Soft, nontender.  Good bowel sounds. LUNGS:  Clear to auscultation. EXTREMITIES:  Bilateral lower extremity wounds with ischemic changes. Dry dressings in place.  Sacral decubitus noted.  REHABILITATION HOSPITAL COURSE:  The patient was admitted to Inpatient Rehab Services.  Therapies initiated on a 3-hour daily basis, consisting of physical therapy, occupational therapy, and rehabilitation nursing. The following issues were addressed during the patient's rehabilitation stay.  Pertaining to Mr. Cannady's severe debilitation secondary to respiratory failure, MSSA sepsis, he continued to progress in his therapies.  He remained on chronic Coumadin therapy, no bleeding episodes.  A home health nurse had been arranged for the necessary blood draws with the next taking place on  August 23, 2017, results to [facility] [phone].  In regard to his MSSA sepsis, his intravenous antibiotics had since been completed on August 20, 2017; transition to oral Keflex 250 mg every 12 hours for suppressive therapy and followup Infectious Disease.   Hemodialysis as per Renal Services, which had been arranged as an outpatient.  Bilateral lower extremity skin wounds, sacral decubitus, debrided on August 15, 2017.  Wound care nurse followup as well as a home health nurse for necessary skin care.  He had no chest pain or shortness of breath.  Followed by Cardiology Services for history of implantable defibrillator.  He exhibited no other signs of fluid overload.  Blood sugars overall controlled.  Lantus therapy as directed, diabetic teaching.  Blood pressures monitored.  Bouts of orthostasis.  He remained on ProAmatine. The patient received weekly collaborative interdisciplinary team conferences to discuss estimated length of stay, family teaching, any barriers to his discharge.  Performed ambulation outdoors with rolling walker, 150 feet, supervision.  Transfers to an SUV with increased time, supervision.  Curb training supervision, good awareness of deficits. Improved balance noted.  Working with energy conservation techniques. He could gather his belongings for activities of daily living and homemaking, needing rest breaks.  Full family teaching was completed and plan discharge to home.  DISCHARGE MEDICATIONS: 1. Keflex 250 mg p.o. every 12 hours. 2. Colchicine 0.3 mg p.o. daily. 3. Lantus insulin 8 units at bedtime. 4. Claritin 10 mg p.o. daily. 5. ProAmatine 10 mg p.o. t.i.d. 6. Multivitamin daily. 7. Velphoro 500 mg p.o. t.i.d. 8. Coumadin latest dose of 2 mg adjusted accordingly. 9. Hydrocodone 1-2 tablets every 4 hours as needed pain.  DIET:  His diet was a diabetic diet.  FOLLOWUP:  The patient would follow up with Dr. Delice Lesch at the Outpatient Rehab Service office as advised, Dr. Madelon Lips, Renal Services; Dr. Michel Bickers, Infectious Disease, call for appointment; Dr. Juel Burrow, Cardiology Service, Dr. Redmond School, office to contact for appointment.  SPECIAL INSTRUCTIONS:  Include hemodialysis  as directed.  Wound care, sacral ulcer, cleanse with normal saline, pat dry.  Apply Santyl in a nickel thick layer, cover with normal saline-moistened gauze, cover with gauze, change daily. Cleanse bilateral lower extremities with normal saline pat dry cover with Xeroform, wrapped with Kerlix, no tape on skin daily  Routine colostomy care and a home health nurse to check INR on August 23, 2017, results to [facility], fax [phone].     Lauraine Rinne, P.A.   ______________________________ Delice Lesch, MD    DA/MEDQ  D:  08/19/2017  T:  08/19/2017  Job:  [id number]  cc:   Sherrilee Gilles. Gerarda Fraction, MD Delice Lesch, MD Michel Bickers, M.D. Dr. Carlyle Dolly Dr. Madelon Lips

## 2017-08-19 NOTE — Discharge Summary (Signed)
Discharge summary job [id number]

## 2017-08-19 NOTE — Progress Notes (Signed)
Notified by patient that while during self colostomy care skin area  irritation with small tear to stoma bleeding area .Order received for silver Nitrate application. , area cover with NS dressing until arrive, patient informed

## 2017-08-19 NOTE — Progress Notes (Signed)
Per new request, pt placed on CPAP.  Pt is tolerating well at this time.

## 2017-08-19 NOTE — Progress Notes (Addendum)
ANTICOAGULATION CONSULT NOTE - Follow Up Consult  Pharmacy Consult for warfarin Indication: atrial fibrillation  No Known Allergies  Patient Measurements: Height: 5\' 10"  (177.8 cm) Weight: 176 lb 5.9 oz (80 kg) IBW/kg (Calculated) : 73  Vital Signs:    Labs:  Recent Labs  08/17/17 0709 08/18/17 1154 08/19/17 1116  HGB  --  9.6* 9.8*  HCT  --  31.0* 31.7*  PLT  --  126* 126*  LABPROT 24.7*  --  25.4*  INR 2.26  --  2.34  CREATININE  --  3.05* 2.61*    Estimated Creatinine Clearance: 30.3 mL/min (A) (by C-G formula based on SCr of 2.61 mg/dL (H)).  Assessment: 61yo M continues on warfarin for afib, appears to be very sensitive to dose adjustments.  INR has been fairly stable and therapeutic today at 2.3. Will plan on giving patient 1mg  daily for now, he has been very sensitive to adjustments and his requirements seem to be less than prior to this long admission.  PTA dose: 2mg  daily except 1mg  Tue/Thur  Goal of Therapy:  INR 2-3 Monitor platelets by anticoagulation protocol: Yes   Plan:  Warfarin 1mg  daily for now Recheck INR with next HD session  Erin Hearing PharmD., BCPS Clinical Pharmacist Pager [phone] 08/19/2017 1:18 PM

## 2017-08-19 NOTE — Progress Notes (Signed)
Advanced Heart Failure Rounding Note  PCP:  Primary Cardiologist: Dr. Haroldine Laws   Subjective:   Feeling good. Wants to go home.   Denies SOB. Tolerating HD.   Objective:   Weight Range: 176 lb 5.9 oz (80 kg) Body mass index is 25.31 kg/m.   Vital Signs:   Temp:  [97.4 F (36.3 C)-97.5 F (36.4 C)] 97.5 F (36.4 C) (09/13 1524) Pulse Rate:  [68-79] 79 (09/13 2200) Resp:  [17-18] 17 (09/13 1524) BP: (97-121)/(45-60) 121/60 (09/13 2200) SpO2:  [97 %-100 %] 100 % (09/13 2200) Last BM Date: 08/19/17  Weight change: Filed Weights   08/17/17 0500 08/18/17 0500 08/18/17 1004  Weight: 179 lb 1.6 oz (81.2 kg) 176 lb 5.9 oz (80 kg) 176 lb 5.9 oz (80 kg)    Intake/Output:   Intake/Output Summary (Last 24 hours) at 08/19/17 1019 Last data filed at 08/18/17 1857  Gross per 24 hour  Intake              480 ml  Output             3000 ml  Net            -2520 ml      Physical Exam   General:  Well appearing. No resp difficulty. Sitting on the side of the bed.  HEENT: normal Neck: supple. JVP ~10 . Carotids 2+ bilat; no bruits. No lymphadenopathy or thryomegaly appreciated. Cor: PMI nondisplaced. Regular rate & rhythm. No rubs, gallops. 2/6 SEM RSB Lungs: clear Abdomen: soft, nontender, nondistended. No hepatosplenomegaly. No bruits or masses. Good bowel sounds. Extremities: no cyanosis, clubbing, rash, 1-2+ edema RUE fistula  Neuro: alert & orientedx3, cranial nerves grossly intact. moves all 4 extremities w/o difficulty. Affect pleasant   Telemetry   NA   EKG    N/A  Labs    CBC  Recent Labs  08/18/17 1154  WBC 8.9  HGB 9.6*  HCT 31.0*  MCV 99.0  PLT 170*   Basic Metabolic Panel  Recent Labs  08/18/17 1154  NA 129*  K 4.0  CL 95*  CO2 25  GLUCOSE 147*  BUN 33*  CREATININE 3.05*  CALCIUM 8.7*  PHOS 3.0   Liver Function Tests  Recent Labs  08/18/17 1154  ALBUMIN 1.9*   No results for input(s): LIPASE, AMYLASE in the last 72  hours. Cardiac Enzymes No results for input(s): CKTOTAL, CKMB, CKMBINDEX, TROPONINI in the last 72 hours.  BNP: BNP (last 3 results)  Recent Labs  06/13/17 1205 07/09/17 2151  BNP 1,151.0* 1,553.9*    ProBNP (last 3 results) No results for input(s): PROBNP in the last 8760 hours.   D-Dimer No results for input(s): DDIMER in the last 72 hours. Hemoglobin A1C No results for input(s): HGBA1C in the last 72 hours. Fasting Lipid Panel No results for input(s): CHOL, HDL, LDLCALC, TRIG, CHOLHDL, LDLDIRECT in the last 72 hours. Thyroid Function Tests No results for input(s): TSH, T4TOTAL, T3FREE, THYROIDAB in the last 72 hours.  Invalid input(s): FREET3  Other results:   Imaging    No results found.   Medications:     Scheduled Medications: . cephALEXin  250 mg Oral Q12H  . colchicine  0.3 mg Oral Daily  . collagenase  1 application Topical Daily  . darbepoetin (ARANESP) injection - NON-DIALYSIS  200 mcg Subcutaneous Q Wed-1800  . feeding supplement (ENSURE ENLIVE)  237 mL Oral Q1500  . feeding supplement (PRO-STAT SUGAR FREE 64)  30  mL Oral BID  . hydrocerin   Topical BID  . insulin aspart  0-5 Units Subcutaneous QHS  . insulin aspart  3 Units Subcutaneous TID WC  . insulin glargine  8 Units Subcutaneous QHS  . loratadine  10 mg Oral Daily  . midodrine  10 mg Oral TID WC  . multivitamin  1 tablet Oral QHS  . sucroferric oxyhydroxide  500 mg Oral TID WC  . Warfarin - Pharmacist Dosing Inpatient   Does not apply q1800    Infusions: . albumin human      PRN Medications: acetaminophen, alum & mag hydroxide-simeth, HYDROcodone-acetaminophen, ondansetron **OR** ondansetron (ZOFRAN) IV, silver nitrate applicators, sodium chloride, sodium chloride flush, sorbitol    Patient Profile   [name] is a 61 year old with a history of DMII, [place] 2016, rectal cancer, CKD Stage IV, chronic A fib, S/P AVN ablation, PAF, CAD BMS LAD 2011 BMS LAD 2009, OSA, chronic systolic  heart failure, St Jude BiV ICD   Previously admitted with AKI and shock with elevated lactate, suspect cardiogenic.   Moved to CIR 08/02/17.  Assessment/Plan   1. Acute on chronic systolic CHF/cardiogenic shock: Last echo in 8/17 with EF 15-20%.  Ischemic cardiomyopathy, has St Jude BiV ICD (despite chronic afib appears to be effectively BiV pacing).  Had recent admission (discharged 06/21/17) requiring milrinone support and diuresis.  Admitted with soft BP (SBP 80s-90s) + elevated lactate.  Suspect mixed septic/cardiogenic shock with MSSA bacteremia.  - CVVHD stopped 07/20/17. - Tolerating iHD,. Making some urine.  - No beta blocker with decompensation, hypotension.  - No ARB with AKI.   2. AKI on CKD stage 4:  -Tolerating iHD.  - Per Nephrology. Appreciate their management of patient.  Has outpatient set up.   3. Atrial fibrillation: Chronic - Continue coumadin. No plans to extract device.  - INR pending. Dosing per HF pharmacy.    4. CAD:  - No S/S ischemia    - Continue ASA. No statin with recent elevated LFT's    5. ID:  - Blood CX 2/2 MSSA. Antibiotics narrowed, now on Ancef. 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   - Not planning ICD extraction for now due to severe comorbidities. He is device dependent.  - TEE 07/19/17 with no evidence of endocarditis.  - Per ID continue cefazolin until 9/15 the start renally-dosed cephalexin. PICC in place.   6. H/O Rectal CA with diverting colostomy   7. Type II diabetes:  - Per primary.   8. Liver failure:  - LFTs resolved on initial admit. Resolved.    9. Anemia of chronic disease. - s/p RBCs on 8/17.  - No bleeding problems.     10. Deconditioning--  - Appreciate CIR.  Home tomorrow. Follow up Dr Haroldine Laws set up.    Length of Stay: [place], NP  08/19/2017, 10:19 AM  Advanced Heart Failure Team Pager [phone] (M-F; [place])  Please contact [facility] for night-coverage after hours  (4p -7a ) and weekends on [email or web address]  Patient seen and examined with Darrick Grinder, NP. We discussed all aspects of the encounter. I agree with the assessment and plan as stated above.   Improving slowly. Weight coming down with HD. BP stable. Will finish abx tomorrow and be discharged home. I will see him in clinic in 1 week. Hold all HF meds with low BP and need for HD.   Glori Bickers, MD  10:51 AM

## 2017-08-19 NOTE — Progress Notes (Signed)
Social Work Patient ID: [name], male   DOB: May 20, 1956, 61 y.o.   MRN: [id number]   CSW met with pt and his wife on 08-17-17 to update them on team conference discussion and to discuss upcoming d/c and solidify plans.  Pt will have [facility] for PT/OT/RN and DME ordered, with hospital bed going out to the house and w/c to room for staff to check it for accuracy of preferred w/c.  Pt/wife pleased with pt's progress and care received.  Pt/wife feel prepared for d/c on 08-20-17.  CSW remains available should needs arise.

## 2017-08-19 NOTE — Discharge Instructions (Signed)
Inpatient Rehab Discharge Instructions  [name] Discharge date and time: No discharge date for patient encounter.   Activities/Precautions/ Functional Status: Activity: activity as tolerated Diet: renal diet Wound Care: keep wound clean and dry Functional status:  ___ No restrictions     ___ Walk up steps independently ___ 24/7 supervision/assistance   ___ Walk up steps with assistance ___ Intermittent supervision/assistance  ___ Bathe/dress independently ___ Walk with walker     _x__ Bathe/dress with assistance ___ Walk Independently    ___ Shower independently ___ Walk with assistance    ___ Shower with assistance ___ No alcohol     ___ Return to work/school ________  COMMUNITY REFERRALS UPON DISCHARGE:   Home Health:   PT     OT      ST    RN    Agency:  [facility] Phone:  [phone] Medical Equipment/Items Ordered:  Hospital bed; 18"x18" lightweight wheelchair; 18"x18" Roho cushion  Agency/Supplier:  [facility]             Phone:  [phone]  Special Instructions: Hemodialysis as directed.  Continue cephalexin 250 mg twice daily until follow-up with infectious disease Dr. Michel Bickers [phone]  Wound care sacral ulcer cleanse with normal saline and dry apply Santyl in a nickel thick layer covered with normal saline moistened gauze, cover with gauze changed daily  Cleanse bilateral lower extremities with normal saline and patted dry cover with Xeroform wrap with Kerlix note tape on skin daily  Routine colostomy care  Home health nurse to check INR 08/23/2017 results to [facility]/ Coumadin clinic [phone] fax number [phone]   My questions have been answered and I understand these instructions. I will adhere to these goals and the provided educational materials after my discharge from the hospital.  Patient/Caregiver Signature _______________________________ Date __________  Clinician Signature  _______________________________________ Date __________  Please bring this form and your medication list with you to all your follow-up doctor's appointments.

## 2017-08-19 NOTE — Progress Notes (Signed)
[place] PHYSICAL MEDICINE & REHABILITATION     PROGRESS NOTE  Subjective/Complaints:  Pt seen sitting up at the EOB this AM eating breakfast.  He slept well overnight.  He is looking forward to d/c tomorrow.   ROS: Denies CP, SOB, N/V/D.  Objective: Vital Signs: Blood pressure 121/60, pulse 79, temperature (!) 97.5 F (36.4 C), temperature source Oral, resp. rate 17, height 5\' 10"  (1.778 m), weight 80 kg (176 lb 5.9 oz), SpO2 100 %. No results found.  Recent Labs  08/18/17 1154  WBC 8.9  HGB 9.6*  HCT 31.0*  PLT 126*    Recent Labs  08/18/17 1154  NA 129*  K 4.0  CL 95*  GLUCOSE 147*  BUN 33*  CREATININE 3.05*  CALCIUM 8.7*   CBG (last 3)   Recent Labs  08/18/17 1643 08/18/17 2132 08/19/17 0641  GLUCAP 87 126* 85    Wt Readings from Last 3 Encounters:  08/18/17 80 kg (176 lb 5.9 oz)  08/02/17 70.2 kg (154 lb 12.2 oz)  06/21/17 79.2 kg (174 lb 8 oz)    Physical Exam:  BP 121/60 (BP Location: Left Arm)   Pulse 79   Temp (!) 97.5 F (36.4 C) (Oral)   Resp 17   Ht 5\' 10"  (1.778 m)   Wt 80 kg (176 lb 5.9 oz)   SpO2 100%   BMI 25.31 kg/m  Constitutional: No distress. Vital signs reviewed.  HENT: Normocephalic and atraumatic.  Eyes: EOM are normal. No discharge.  Cardiovascular: Cardiac rate controlled. No JVD. Respiratory: Effort normal and breath sounds normal.  GI: Bowel sounds are normal. He exhibits no distension.  Colostomy in place . Musc: No edema. No tenderness Neurological: Alert.  Motor: b/l UE 4/5 deltoid, biceps, triceps, wrist ext, HI (unchanged).  LLE: 4-/5 HF and 4+/5 KE and 5/5 ADF/PF.  [name]: 3-/5 HF, 4-/5 KE and 4+/5 ADF/PF (unchanged). Skin: Multiple wounds. Sacral ulcer unstageable, not examined today.  Psych: flat, cooperative   Assessment/Plan: 1. Functional deficits secondary to debility which require 3+ hours per day of interdisciplinary therapy in a comprehensive inpatient rehab setting. Physiatrist is providing close  team supervision and 24 hour management of active medical problems listed below. Physiatrist and rehab team continue to assess barriers to discharge/monitor patient progress toward functional and medical goals.  Function:  Bathing Bathing position   Position: Wheelchair/chair at sink  Bathing parts Body parts bathed by patient: Right arm, Left arm, Chest, Abdomen, Front perineal area, Buttocks, Right upper leg, Left upper leg Body parts bathed by helper: Back  Bathing assist Assist Level: Supervision or verbal cues      Upper Body Dressing/Undressing Upper body dressing   What is the patient wearing?: Pull over shirt/dress     Pull over shirt/dress - Perfomed by patient: Thread/unthread right sleeve, Thread/unthread left sleeve, Put head through opening, Pull shirt over trunk          Upper body assist Assist Level: Set up   Set up : To obtain clothing/put away  Lower Body Dressing/Undressing Lower body dressing   What is the patient wearing?: Underwear, Pants Underwear - Performed by patient: Thread/unthread right underwear leg, Thread/unthread left underwear leg, Pull underwear up/down Underwear - Performed by helper: Thread/unthread left underwear leg Pants- Performed by patient: Thread/unthread right pants leg, Thread/unthread left pants leg, Pull pants up/down, Fasten/unfasten pants Pants- Performed by helper: Thread/unthread right pants leg   Non-skid slipper socks- Performed by helper: Don/doff right sock, Don/doff left sock  Lower body assist Assist for lower body dressing: Supervision or verbal cues   Set up : To obtain clothing/put away  Toileting Toileting Toileting activity did not occur: No continent bowel/bladder event   Toileting steps completed by helper: Adjust clothing prior to toileting, Performs perineal hygiene, Adjust clothing after toileting (per Elmo Putt, NT report)    Toileting assist     Transfers Chair/bed transfer    Chair/bed transfer method: Ambulatory Chair/bed transfer assist level: Supervision or verbal cues Chair/bed transfer assistive device: Armrests, Medical sales representative     Max distance: 150 Assist level: Supervision or verbal cues   Wheelchair   Type: Manual Max wheelchair distance: 146ft  Assist Level: Supervision or verbal cues  Cognition Comprehension Comprehension assist level: Follows complex conversation/direction with extra time/assistive device  Expression Expression assist level: Expresses basic needs/ideas: With no assist  Social Interaction Social Interaction assist level: Interacts appropriately with others - No medications needed.  Problem Solving Problem solving assist level: Solves basic 75 - 89% of the time/requires cueing 10 - 24% of the time  Memory Memory assist level: Recognizes or recalls 90% of the time/requires cueing < 10% of the time    Medical Problem List and Plan:  1. Severe debilitation/encephalopathy secondary to respiratory failure, CHF, MSSA sepsis   Plan for d/c tomorrow  Will see pt for transitional care management in 1-2 weeks post discharge 2. DVT Prophylaxis/Anticoagulation: Chronic Coumadin therapy and monitor for rebleeding episodes   INR pending on 9/14 3. Pain Management: Hydrocodone as needed  4. Mood: Provide emotional support  5. Neuropsych: This patient is capable of making decisions on his own behalf.  6. Skin/Wound Care/lower extremity leg wound: Routine skin checks with skin care as directed   Appreciate WOC recs  Debrided 9/10 7. Fluids/Electrolytes/Nutrition: Routine I&Os 8. 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 Followed closely by renal services.   On dialysis, appreciate nephro recs, aggressively diuresing as tolerated, tolerating at present  Cont to monitor 9. MSSA bacteremia. Intravenous cefazolin through 08/20/2017 then chronic Keflex and follow-up per infectious disease  10. 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 Routine colostomy care  11. CAD with implantable defibrillator. Follow-up cardiology services. No plan to extract device  12. Chronic systolic congestive heart failure/ischemic cardiomyopathy. Monitor for any signs of fluid overload.  Filed Weights   08/17/17 0500 08/18/17 0500 08/18/17 1004  Weight: 81.2 kg (179 lb 1.6 oz) 80 kg (176 lb 5.9 oz) 80 kg (176 lb 5.9 oz)  13. Diabetes mellitus and peripheral neuropathy.   Lantus insulin 10 decreased to 6U due to hypoglycemia on 9/1, Increased to 8 units on 9/2  Relatively controlled 9/14  Will consider further adjustments if necessary 14. Orthostasis. Continue ProAmatine 10 mg 3 times a day.  Controlled 15. Acute on chronic anemia. Continue Aranesp.   Hb 9.6 on 9/14  Labs with HD 16. History of gout. Colchicine 0.3 mg daily. Monitor for any gout flareups  17. Malnourishment  Supplement initiated 8/29 18. Thrombocytopenia  Plts 126 on 9/14  Labs with HD  Cont to monitor  LOS (Days) 17 A FACE TO FACE EVALUATION WAS PERFORMED  Ankit Lorie Phenix 08/19/2017 10:21 AM

## 2017-08-19 NOTE — Consult Note (Addendum)
[place] Nurse wound follow up Wound type:unstageable sacral pressure ulcer, also has DTI, stage II, and venous insufficieny wounds. Recheck today is for sacral ulcer which is improving. Measurement:now measures 7cm x 5.5cm x 1cm Wound bed: sacral ulcer 10% yellow fibrin surrounding 90% pink moist wound bed. Drainage (amount, consistency, odor) scant Periwound: intact skin that blanches Dressing procedure/placement/frequency: Ulcer improving, MD debrided 9/10. Continue with current orders for santyl dressing changes to sacral ulcer, continue prevalon boots, foam dressings on heels will continue to see if these will dry and stay intact. Pt to go home tomorrow, rehab is complete, will have [facility] for wound care. We will not follow, but will remain available to this patient, to nursing, and the medical and/or surgical teams. Please re-consult if we need to assist further before pending discharge.   [place] Nurse ostomy follow up note Patient is completely self care with his ostomy. States he has plenty of supplies and does not require any assistance.   Fara Olden, RN-C, WTA-C, [place] [facility] Care Associate

## 2017-08-19 NOTE — Progress Notes (Signed)
  [facility] Progress Note   Assessment/ Plan:    1. MSSA bacteremia on Ancef though 9/15 and then PO cefalexein. 2. ESRD: Continue hemodialysis via right brachiocephalic fistula. Initially wanted to go to [facility] he expressed that he wants to do [facility].  CLIP in process.  Have given serial HD treatments for vol overload, now on TTS treatment.  For HD tomorrow AM. On midodrine 10 TID for BP support.    3. Deconditioning: rehabilitation in CIR 4. Afib: on warfarn 5.  H/o rectal cancer: diverting ostomy 6.  Acute on chronic systolic CHF: EF 62-22%.  St Jude BiV ICD in place.  Per cardiology, no plans for lead extraction due to comorbids. 7. Bone/ mineral : phos controlled on Velphoro 500 mg TID AC 8.  Anemia: Aranesp 200 mcg, not given on Wed, will give with HD tomorrow  Subjective:    Feeling well- feeling like vol removal has been effective.       Objective:   BP 121/60 (BP Location: Left Arm)   Pulse 79   Temp (!) 97.5 F (36.4 C) (Oral)   Resp 17   Ht 5\' 10"  (1.778 m)   Wt 80 kg (176 lb 5.9 oz)   SpO2 100%   BMI 25.31 kg/m   Physical Exam:   GEN NAD HEENT EOMI, perrl NECK JVD improved PULM clear  CV RRR ABD mildly distended, ostomy EXT wrapped, 1+ LE edema, improving NEURO AAO x3 ACCESS RUE AVF with + T/B  Labs: BMET  Recent Labs Lab 08/12/17 1600 08/13/17 1429 08/15/17 2130 08/18/17 1154  NA 127* 130* 129* 129*  K 4.0 3.2* 3.6 4.0  CL 91* 94* 95* 95*  CO2 24 26 24 25   GLUCOSE 119* 221* 168* 147*  BUN 42* 25* 39* 33*  CREATININE 3.86* 2.66* 4.03* 3.05*  CALCIUM 8.7* 8.2* 8.6* 8.7*  PHOS 2.9 1.9* 3.6 3.0   CBC  Recent Labs Lab 08/12/17 1600 08/13/17 1430 08/15/17 2130 08/18/17 1154  WBC 9.6 9.6 9.1 8.9  HGB 9.2* 9.0* 9.4* 9.6*  HCT 28.8* 29.6* 30.5* 31.0*  MCV 97.3 99.3 98.4 99.0  PLT 132* 110* 121* 126*    @IMGRELPRIORS @ Medications:    . cephALEXin  250 mg Oral Q12H  . colchicine  0.3 mg  Oral Daily  . collagenase  1 application Topical Daily  . darbepoetin (ARANESP) injection - NON-DIALYSIS  200 mcg Subcutaneous Q Wed-1800  . feeding supplement (ENSURE ENLIVE)  237 mL Oral Q1500  . feeding supplement (PRO-STAT SUGAR FREE 64)  30 mL Oral BID  . hydrocerin   Topical BID  . insulin aspart  0-5 Units Subcutaneous QHS  . insulin aspart  3 Units Subcutaneous TID WC  . insulin glargine  8 Units Subcutaneous QHS  . loratadine  10 mg Oral Daily  . midodrine  10 mg Oral TID WC  . multivitamin  1 tablet Oral QHS  . sucroferric oxyhydroxide  500 mg Oral TID WC  . warfarin  1 mg Oral q1800  . Warfarin - Pharmacist Dosing Inpatient   Does not apply q1800     Madelon Lips MD [facility] pgr [phone] 08/19/2017, 10:54 AM

## 2017-08-19 NOTE — Patient Care Conference (Signed)
Inpatient RehabilitationTeam Conference and Plan of Care Update Date: 08/17/2017   Time: 2:50 PM    Patient Name: [name]      Medical Record Number: [id number]  Date of Birth: [birth year] Sex: Male         Room/Bed: 4W19C/4W19C-01 Payor Info: Payor: MEDICARE / Plan: MEDICARE PART A AND B / Product Type: *No Product type* /    Admitting Diagnosis: Debility  Admit Date/Time:  08/02/2017  5:24 PM Admission Comments: No comment available   Primary Diagnosis:  <principal problem not specified> Principal Problem: <principal problem not specified>  Patient Active Problem List   Diagnosis Date Noted  . Hypoglycemia   . Severe protein-calorie malnutrition (HCC)   . Acute blood loss anemia   . Type 2 diabetes mellitus with peripheral neuropathy (HCC)   . Chronic systolic heart failure (HCC)   . Colostomy care (HCC)   . Chronic kidney disease (CKD), stage IV (severe) (HCC)   . Dependence on renal dialysis (HCC)   . Wounds, multiple   . Supratherapeutic INR   . MSSA bacteremia 08/02/2017  . ESRD (end stage renal disease) (HCC)   . Debility   . Acute on chronic systolic CHF (congestive heart failure) (HCC)   . Bacteremia due to methicillin susceptible Staphylococcus aureus (MSSA)   . Infected defibrillator (HCC)   . Septic hip (HCC)   . Staphylococcus aureus bacteremia with sepsis (HCC)   . Severe sepsis with septic shock (HCC)   . Cardiogenic shock (HCC) 07/10/2017  . Septic shock (HCC) 07/10/2017  . Cardiorenal syndrome with renal failure   . Thrombocytopenia (HCC) 06/14/2017  . GI bleed 04/27/2017  . Portal hypertensive gastropathy (HCC) 04/27/2017  . Hypokalemia 04/27/2017  . Esophageal dysphagia 04/13/2017  . Hepatic cirrhosis (HCC) 02/24/2017  . Acute on chronic systolic heart failure (HCC)   . Biliary disease with obstruction   . AKI (acute kidney injury) (HCC)   . Abnormal LFTs 05/10/2016  . Pain in the chest   . Right heart failure (HCC) 09/26/2015  . History of  colonic polyps 09/24/2015  . Chronic atrial fibrillation (HCC)   . Bleeding from colostomy stoma (HCC) 08/23/2015  . Anemia of chronic disease 07/21/2015  . Constipation 05/01/2015  . Transaminitis 05/01/2015  . NSTEMI- 04/23/15- Myoview scar- no cath 04/27/2015  . Acute respiratory failure (HCC) 04/23/2015  . Chronic cholecystitis 04/04/2015  . Cystitis with hematuria-April 2016 04/04/2015  . Chronically elevated transaminase level-Amiodarone resumed 04/24/15 04/03/2015  . H/O ventricular fibrillation-March 2016 02/24/2015  . BiV ICD (St Jude) gen change 04/10/15 02/24/2015  . Hyperlipidemia   . Dietary noncompliance   . Orthostatic hypotension 07/19/2014  . OSA on CPAP   . GERD (gastroesophageal reflux disease) 06/05/2014  . Gallbladder polyp 03/06/2014  . DM type 2, uncontrolled, with renal complications (HCC) 01/27/2014  . Dizziness 01/26/2014  . Obesity 12/12/2013  . Chronic hypotension 12/11/2013  . Chronic kidney disease, stage IV (severe) (HCC) 12/11/2013  . Chronic a-fib (HCC)   . CAD with LCX disease and stents to LAD 07/10/2013  . Long term current use of anticoagulant therapy 03/23/2013  . History of rectal cancer   . Ischemic cardiomyopathy-EF 35% 04/24/15 echo     Expected Discharge Date: Expected Discharge Date: 08/20/17  Team Members Present: Physician leading conference: Dr. Maryla Morrow Social Worker Present: Staci Acosta, LCSW Nurse Present: Carlean Purl, RN PT Present: Wanda Plump, PT OT Present: Rosalio Loud, OT SLP Present: Jackalyn Lombard, SLP PPS Coordinator present :  Edson Snowball, PT     Current Status/Progress Goal Weekly Team Focus  Medical   Severe debilitation/encephalopathy secondary to respiratory failure, CHF, MSSA sepsis   Wound care, bacteremia  See above   Bowel/Bladder   HD -Oliguric [place] self care     Assess QS and prn    Swallow/Nutrition/ Hydration             ADL's   supervision overall  supervision overall  d/c from  therapy.  provided with HEP for continued activity while completing meds   Mobility   supervision overall  supervision overall  d/c from therapy due to goals being met, family ed and d/c planning completed   Communication             Safety/Cognition/ Behavioral Observations            Pain   Norco for c;o sacral pain rating 9/10 f/u 4-5/10 with meds  pain sore maintain < 3  Assess QS and prn    Skin   debridement to  scaraal area on 08/15/17  free of skin breakdown and infection       Rehab Goals Rehab Goals Revised: none *See Care Plan and progress notes for long and short-term goals.     Barriers to Discharge  Current Status/Progress Possible Resolutions Date Resolved   Physician    Medical stability;IV antibiotics;Wound Care;Hemodialysis     See above  Coumadin, wound care, neprho recs, optimize DM meds      Nursing                  PT                    OT                  SLP                SW                Discharge Planning/Teaching Needs:  Pt to return to his home with his wife and extended family to assist pt.  Family education completed with wife.   Team Discussion:  Dr. Allena Katz is making some diabetic medication adjustments and pt is completing IV antibiotics in a few more days and will then switch to p.o. Meds.  Dr. Allena Katz also did wound debridement and RN has taught wife how to do dressing changes.  Wounds are smaller, pt voids occasionally, laisx was d/c'd, pt is independently managing his colostomy.  Pt with low BPs, but feels fine.  Pt met long term PT/OT goals.  Walked 150' with RW and did 4 stairs; is using leg lifter.  PT/OT family education is completed and pt is overall supervision and has exercises to complete in the room as he awaits medical stability.  Revisions to Treatment Plan:  Therapy completed.  Awaiting end of IV antibiotics for medical stability.    Continued Need for Acute Rehabilitation Level of Care: The patient requires daily medical  management by a physician with specialized training in physical medicine and rehabilitation for the following conditions: Daily direction of a multidisciplinary physical rehabilitation program to ensure safe treatment while eliciting the highest outcome that is of practical value to the patient.: Yes Daily medical management of patient stability for increased activity during participation in an intensive rehabilitation regime.: Yes Daily analysis of laboratory values and/or radiology reports with any subsequent need for medication adjustment of medical intervention for :  Cardiac problems;Pulmonary problems;Diabetes problems;Renal problems;Wound care problems;Other  Yu Cragun, [place] 08/19/2017, 1:21 AM

## 2017-08-20 LAB — CBC
HCT: 31.2 % — ABNORMAL LOW (ref 39.0–52.0)
HEMOGLOBIN: 9.8 g/dL — AB (ref 13.0–17.0)
MCH: 30.7 pg (ref 26.0–34.0)
MCHC: 31.4 g/dL (ref 30.0–36.0)
MCV: 97.8 fL (ref 78.0–100.0)
PLATELETS: 131 10*3/uL — AB (ref 150–400)
RBC: 3.19 MIL/uL — ABNORMAL LOW (ref 4.22–5.81)
RDW: 21.5 % — ABNORMAL HIGH (ref 11.5–15.5)
WBC: 8.9 10*3/uL (ref 4.0–10.5)

## 2017-08-20 LAB — RENAL FUNCTION PANEL
ALBUMIN: 2 g/dL — AB (ref 3.5–5.0)
Anion gap: 10 (ref 5–15)
BUN: 38 mg/dL — AB (ref 6–20)
CHLORIDE: 94 mmol/L — AB (ref 101–111)
CO2: 25 mmol/L (ref 22–32)
CREATININE: 3.44 mg/dL — AB (ref 0.61–1.24)
Calcium: 8.8 mg/dL — ABNORMAL LOW (ref 8.9–10.3)
GFR, EST AFRICAN AMERICAN: 20 mL/min — AB (ref >60)
GFR, EST NON AFRICAN AMERICAN: 18 mL/min — AB (ref >60)
GLUCOSE: 130 mg/dL — AB (ref 65–99)
Phosphorus: 3.3 mg/dL (ref 2.5–4.6)
Potassium: 3.8 mmol/L (ref 3.5–5.1)
Sodium: 129 mmol/L — ABNORMAL LOW (ref 135–145)

## 2017-08-20 LAB — PROTIME-INR
INR: 2.58
PROTHROMBIN TIME: 27.5 s — AB (ref 11.4–15.2)

## 2017-08-20 LAB — GLUCOSE, CAPILLARY
Glucose-Capillary: 119 mg/dL — ABNORMAL HIGH (ref 65–99)
Glucose-Capillary: 105 mg/dL — ABNORMAL HIGH (ref 65–99)

## 2017-08-20 MED ORDER — DARBEPOETIN ALFA 100 MCG/0.5ML IJ SOSY
PREFILLED_SYRINGE | INTRAMUSCULAR | Status: AC
  Filled 2017-08-20: qty 0.5

## 2017-08-20 MED ORDER — MIDODRINE HCL 5 MG PO TABS
ORAL_TABLET | ORAL | Status: AC
  Filled 2017-08-20: qty 2

## 2017-08-20 NOTE — Progress Notes (Signed)
ANTICOAGULATION CONSULT NOTE - Follow Up Consult  Pharmacy Consult for warfarin Indication: atrial fibrillation  No Known Allergies  Patient Measurements: Height: 5\' 10"  (177.8 cm) Weight: 178 lb 9.2 oz (81 kg) (Bed Scale) IBW/kg (Calculated) : 73  Vital Signs: Temp: 97.9 F (36.6 C) (09/15 0655) Temp Source: Oral (09/15 0655) BP: 88/51 (09/15 1030) Pulse Rate: 74 (09/15 1030)  Labs:  Recent Labs  08/18/17 1154 08/19/17 1116 08/20/17 0708  HGB 9.6* 9.8* 9.8*  HCT 31.0* 31.7* 31.2*  PLT 126* 126* 131*  LABPROT  --  25.4* 27.5*  INR  --  2.34 2.58  CREATININE 3.05* 2.61* 3.44*    Estimated Creatinine Clearance: 23 mL/min (A) (by C-G formula based on SCr of 3.44 mg/dL (H)).  Assessment: 61yo M continues on warfarin for afib, appears to be very sensitive to dose adjustments.  INR has been fairly stable and therapeutic today at 2.58. Will plan on giving patient 1mg  daily for now, he has been very sensitive to adjustments and his requirements seem to be less than prior to this long admission.  PTA dose: 2mg  daily except 1mg  Tue/Thur  Goal of Therapy:  INR 2-3 Monitor platelets by anticoagulation protocol: Yes   Plan:  Continue Warfarin 1mg  daily for now Recheck INR with next HD session  Thank you Anette Guarneri, PharmD [phone] 08/20/2017 10:52 AM

## 2017-08-20 NOTE — Progress Notes (Signed)
   [facility] Progress Note   Assessment/ Plan:    1. MSSA bacteremia on Ancef though 9/15 and then PO cefalexein. 2. ESRD: Continue hemodialysis via right brachiocephalic fistula. Initially wanted to go to [facility] he expressed that he wants to do [facility].  Has been CLIP'd to [facility] MWF 2nd shift. On midodrine 10 TID for BP support.   Vol status much better.   3. Deconditioning: rehabilitation in CIR 4. Afib: on warfarin 5.  H/o rectal cancer: diverting ostomy 6.  Acute on chronic systolic CHF: EF 81-85%.  St Jude BiV ICD in place.  Per cardiology, no plans for lead extraction due to comorbids. 7. Bone/ mineral : phos controlled on Velphoro 500 mg TID AC.  PTH 58 07/12/17.  8.  Anemia: Aranesp 200 mcg given 9/15.  S/p iron load which ended 08/12/17.  Last Hgb 9.8. 9.  Dispo: discharge today 9/15  Subjective:    Doing well.  For discharge today      Objective:   BP (!) 110/55 (BP Location: Left Arm)   Pulse 73   Temp 97.9 F (36.6 C) (Oral)   Resp 16   Ht 5\' 10"  (1.778 m)   Wt 81 kg (178 lb 9.2 oz) Comment: Bed Scale  SpO2 99%   BMI 25.62 kg/m   Physical Exam:   GEN NAD HEENT EOMI, perrl NECK JVD improved PULM clear  CV RRR ABD mildly distended, ostomy EXT wrapped, 1+ LE edema, improving NEURO AAO x3 ACCESS RUE AVF with + T/B  Labs: BMET  Recent Labs Lab 08/13/17 1429 08/15/17 2130 08/18/17 1154 08/19/17 1116  NA 130* 129* 129* 131*  K 3.2* 3.6 4.0 3.2*  CL 94* 95* 95* 93*  CO2 26 24 25 30   GLUCOSE 221* 168* 147* 176*  BUN 25* 39* 33* 25*  CREATININE 2.66* 4.03* 3.05* 2.61*  CALCIUM 8.2* 8.6* 8.7* 8.8*  PHOS 1.9* 3.6 3.0 3.0   CBC  Recent Labs Lab 08/13/17 1430 08/15/17 2130 08/18/17 1154 08/19/17 1116  WBC 9.6 9.1 8.9 8.1  HGB 9.0* 9.4* 9.6* 9.8*  HCT 29.6* 30.5* 31.0* 31.7*  MCV 99.3 98.4 99.0 99.7  PLT 110* 121* 126* 126*    @IMGRELPRIORS @ Medications:    . cephALEXin  250 mg Oral  Q12H  . colchicine  0.3 mg Oral Daily  . collagenase  1 application Topical Daily  . darbepoetin (ARANESP) injection - DIALYSIS  100 mcg Intravenous Q Sat-HD  . feeding supplement (ENSURE ENLIVE)  237 mL Oral Q1500  . feeding supplement (PRO-STAT SUGAR FREE 64)  30 mL Oral BID  . hydrocerin   Topical BID  . insulin aspart  0-5 Units Subcutaneous QHS  . insulin aspart  3 Units Subcutaneous TID WC  . insulin glargine  8 Units Subcutaneous QHS  . loratadine  10 mg Oral Daily  . midodrine  10 mg Oral TID WC  . multivitamin  1 tablet Oral QHS  . sucroferric oxyhydroxide  500 mg Oral TID WC  . warfarin  1 mg Oral q1800  . Warfarin - Pharmacist Dosing Inpatient   Does not apply q1800     Madelon Lips MD [facility] pgr [phone] 08/20/2017, 7:18 AM

## 2017-08-20 NOTE — Progress Notes (Signed)
Social Work Discharge Note  The overall goal for the admission was met for:   Discharge location: Yes - home  Length of Stay: Yes - 18 days  Discharge activity level: Yes - supervision  Home/community participation: Yes  Services provided included: MD, RD, PT, OT, RN, TR, Pharmacy, Neuropsych and SW  Financial Services: Medicare and Private Insurance: Theme park manager  Follow-up services arranged: Home Health: PT/OT/RN, DME: 18"x18" lightweight wheelchair with Roho cushion; hospital bed and Patient/Family request agency HH: [facility], DME: [facility]  Comments (or additional information): Pt to d/c home with his wife to provide 24/7 supervision.  Pt will have [place] from [place] and has been switched to MWF dialysis at [facility].  Pt will also have DME at home.     Patient/Family verbalized understanding of follow-up arrangements: Yes  Individual responsible for coordination of the follow-up plan: pt and his wife  Confirmed correct DME delivered: Trey Sailors 08/20/2017    Caitlynn Ju, Silvestre Mesi

## 2017-08-20 NOTE — Procedures (Signed)
Patient seen and examined on Hemodialysis.  Orders thusly:  4 hrs 4K/ 2.25 Ca--> may need to change to 3K as 4K used in the setting of serial dialysis treatments which are no longer ongoing F180 dialyzer BFR 400 DFR 800 EDW approximately 79 kg (still being established) RUE AVF No heparin   Treatment adjusted as needed.  Madelon Lips MD [facility] pgr [phone] 7:24 AM

## 2017-08-20 NOTE — Progress Notes (Signed)
Dressing has been changed as order,pt. Got d/c papers and he is ready to go home with his wife.

## 2017-08-21 DIAGNOSIS — K7460 Unspecified cirrhosis of liver: Secondary | ICD-10-CM | POA: Diagnosis not present

## 2017-08-21 DIAGNOSIS — Z955 Presence of coronary angioplasty implant and graft: Secondary | ICD-10-CM | POA: Diagnosis not present

## 2017-08-21 DIAGNOSIS — L989 Disorder of the skin and subcutaneous tissue, unspecified: Secondary | ICD-10-CM | POA: Diagnosis not present

## 2017-08-21 DIAGNOSIS — Z7901 Long term (current) use of anticoagulants: Secondary | ICD-10-CM | POA: Diagnosis not present

## 2017-08-21 DIAGNOSIS — Z933 Colostomy status: Secondary | ICD-10-CM | POA: Diagnosis not present

## 2017-08-21 DIAGNOSIS — Z95810 Presence of automatic (implantable) cardiac defibrillator: Secondary | ICD-10-CM | POA: Diagnosis not present

## 2017-08-21 DIAGNOSIS — Z992 Dependence on renal dialysis: Secondary | ICD-10-CM | POA: Diagnosis not present

## 2017-08-21 DIAGNOSIS — Z8673 Personal history of transient ischemic attack (TIA), and cerebral infarction without residual deficits: Secondary | ICD-10-CM | POA: Diagnosis not present

## 2017-08-21 DIAGNOSIS — R7881 Bacteremia: Secondary | ICD-10-CM | POA: Diagnosis not present

## 2017-08-21 DIAGNOSIS — E1142 Type 2 diabetes mellitus with diabetic polyneuropathy: Secondary | ICD-10-CM | POA: Diagnosis not present

## 2017-08-21 DIAGNOSIS — Z8546 Personal history of malignant neoplasm of prostate: Secondary | ICD-10-CM | POA: Diagnosis not present

## 2017-08-21 DIAGNOSIS — L89890 Pressure ulcer of other site, unstageable: Secondary | ICD-10-CM | POA: Diagnosis not present

## 2017-08-21 DIAGNOSIS — I252 Old myocardial infarction: Secondary | ICD-10-CM | POA: Diagnosis not present

## 2017-08-21 DIAGNOSIS — Z794 Long term (current) use of insulin: Secondary | ICD-10-CM | POA: Diagnosis not present

## 2017-08-21 DIAGNOSIS — L89153 Pressure ulcer of sacral region, stage 3: Secondary | ICD-10-CM | POA: Diagnosis not present

## 2017-08-21 DIAGNOSIS — E1122 Type 2 diabetes mellitus with diabetic chronic kidney disease: Secondary | ICD-10-CM | POA: Diagnosis not present

## 2017-08-21 DIAGNOSIS — Z5181 Encounter for therapeutic drug level monitoring: Secondary | ICD-10-CM | POA: Diagnosis not present

## 2017-08-21 DIAGNOSIS — N184 Chronic kidney disease, stage 4 (severe): Secondary | ICD-10-CM | POA: Diagnosis not present

## 2017-08-21 DIAGNOSIS — B9561 Methicillin susceptible Staphylococcus aureus infection as the cause of diseases classified elsewhere: Secondary | ICD-10-CM | POA: Diagnosis not present

## 2017-08-21 DIAGNOSIS — L89610 Pressure ulcer of right heel, unstageable: Secondary | ICD-10-CM | POA: Diagnosis not present

## 2017-08-21 DIAGNOSIS — I130 Hypertensive heart and chronic kidney disease with heart failure and stage 1 through stage 4 chronic kidney disease, or unspecified chronic kidney disease: Secondary | ICD-10-CM | POA: Diagnosis not present

## 2017-08-21 DIAGNOSIS — L89620 Pressure ulcer of left heel, unstageable: Secondary | ICD-10-CM | POA: Diagnosis not present

## 2017-08-21 DIAGNOSIS — I2510 Atherosclerotic heart disease of native coronary artery without angina pectoris: Secondary | ICD-10-CM | POA: Diagnosis not present

## 2017-08-21 DIAGNOSIS — I5022 Chronic systolic (congestive) heart failure: Secondary | ICD-10-CM | POA: Diagnosis not present

## 2017-08-21 DIAGNOSIS — I480 Paroxysmal atrial fibrillation: Secondary | ICD-10-CM | POA: Diagnosis not present

## 2017-08-22 DIAGNOSIS — N186 End stage renal disease: Secondary | ICD-10-CM | POA: Diagnosis not present

## 2017-08-22 DIAGNOSIS — I2510 Atherosclerotic heart disease of native coronary artery without angina pectoris: Secondary | ICD-10-CM | POA: Diagnosis not present

## 2017-08-22 DIAGNOSIS — I158 Other secondary hypertension: Secondary | ICD-10-CM | POA: Diagnosis not present

## 2017-08-22 DIAGNOSIS — I482 Chronic atrial fibrillation: Secondary | ICD-10-CM | POA: Diagnosis not present

## 2017-08-22 DIAGNOSIS — L89620 Pressure ulcer of left heel, unstageable: Secondary | ICD-10-CM | POA: Diagnosis not present

## 2017-08-22 DIAGNOSIS — E1122 Type 2 diabetes mellitus with diabetic chronic kidney disease: Secondary | ICD-10-CM | POA: Diagnosis not present

## 2017-08-22 DIAGNOSIS — L89610 Pressure ulcer of right heel, unstageable: Secondary | ICD-10-CM | POA: Diagnosis not present

## 2017-08-22 DIAGNOSIS — L89153 Pressure ulcer of sacral region, stage 3: Secondary | ICD-10-CM | POA: Diagnosis not present

## 2017-08-22 DIAGNOSIS — Z5181 Encounter for therapeutic drug level monitoring: Secondary | ICD-10-CM | POA: Diagnosis not present

## 2017-08-22 DIAGNOSIS — D509 Iron deficiency anemia, unspecified: Secondary | ICD-10-CM | POA: Diagnosis not present

## 2017-08-22 DIAGNOSIS — Z992 Dependence on renal dialysis: Secondary | ICD-10-CM | POA: Diagnosis not present

## 2017-08-22 DIAGNOSIS — E1142 Type 2 diabetes mellitus with diabetic polyneuropathy: Secondary | ICD-10-CM | POA: Diagnosis not present

## 2017-08-22 DIAGNOSIS — L89890 Pressure ulcer of other site, unstageable: Secondary | ICD-10-CM | POA: Diagnosis not present

## 2017-08-22 DIAGNOSIS — Z23 Encounter for immunization: Secondary | ICD-10-CM | POA: Diagnosis not present

## 2017-08-23 ENCOUNTER — Ambulatory Visit (INDEPENDENT_AMBULATORY_CARE_PROVIDER_SITE_OTHER): Payer: Medicare Other | Admitting: *Deleted

## 2017-08-23 DIAGNOSIS — L89620 Pressure ulcer of left heel, unstageable: Secondary | ICD-10-CM | POA: Diagnosis not present

## 2017-08-23 DIAGNOSIS — Z5181 Encounter for therapeutic drug level monitoring: Secondary | ICD-10-CM

## 2017-08-23 DIAGNOSIS — I482 Chronic atrial fibrillation, unspecified: Secondary | ICD-10-CM

## 2017-08-23 DIAGNOSIS — L89153 Pressure ulcer of sacral region, stage 3: Secondary | ICD-10-CM | POA: Diagnosis not present

## 2017-08-23 DIAGNOSIS — I2510 Atherosclerotic heart disease of native coronary artery without angina pectoris: Secondary | ICD-10-CM | POA: Diagnosis not present

## 2017-08-23 DIAGNOSIS — Z7901 Long term (current) use of anticoagulants: Secondary | ICD-10-CM

## 2017-08-23 DIAGNOSIS — L89890 Pressure ulcer of other site, unstageable: Secondary | ICD-10-CM | POA: Diagnosis not present

## 2017-08-23 DIAGNOSIS — L89610 Pressure ulcer of right heel, unstageable: Secondary | ICD-10-CM | POA: Diagnosis not present

## 2017-08-23 DIAGNOSIS — E1142 Type 2 diabetes mellitus with diabetic polyneuropathy: Secondary | ICD-10-CM | POA: Diagnosis not present

## 2017-08-23 LAB — POCT INR: INR: 2.5

## 2017-08-24 DIAGNOSIS — Z23 Encounter for immunization: Secondary | ICD-10-CM | POA: Diagnosis not present

## 2017-08-24 DIAGNOSIS — N186 End stage renal disease: Secondary | ICD-10-CM | POA: Diagnosis not present

## 2017-08-24 DIAGNOSIS — D509 Iron deficiency anemia, unspecified: Secondary | ICD-10-CM | POA: Diagnosis not present

## 2017-08-25 ENCOUNTER — Ambulatory Visit (INDEPENDENT_AMBULATORY_CARE_PROVIDER_SITE_OTHER): Payer: Medicare Other | Admitting: Internal Medicine

## 2017-08-25 ENCOUNTER — Telehealth: Payer: Self-pay | Admitting: Cardiology

## 2017-08-25 ENCOUNTER — Telehealth: Payer: Self-pay | Admitting: *Deleted

## 2017-08-25 ENCOUNTER — Encounter: Payer: Medicare Other | Admitting: *Deleted

## 2017-08-25 ENCOUNTER — Ambulatory Visit (INDEPENDENT_AMBULATORY_CARE_PROVIDER_SITE_OTHER): Payer: Medicare Other | Admitting: *Deleted

## 2017-08-25 ENCOUNTER — Encounter: Payer: Self-pay | Admitting: Internal Medicine

## 2017-08-25 DIAGNOSIS — I255 Ischemic cardiomyopathy: Secondary | ICD-10-CM

## 2017-08-25 DIAGNOSIS — L89620 Pressure ulcer of left heel, unstageable: Secondary | ICD-10-CM | POA: Diagnosis not present

## 2017-08-25 DIAGNOSIS — Z5181 Encounter for therapeutic drug level monitoring: Secondary | ICD-10-CM | POA: Diagnosis not present

## 2017-08-25 DIAGNOSIS — L89610 Pressure ulcer of right heel, unstageable: Secondary | ICD-10-CM | POA: Diagnosis not present

## 2017-08-25 DIAGNOSIS — I482 Chronic atrial fibrillation, unspecified: Secondary | ICD-10-CM

## 2017-08-25 DIAGNOSIS — E1142 Type 2 diabetes mellitus with diabetic polyneuropathy: Secondary | ICD-10-CM | POA: Diagnosis not present

## 2017-08-25 DIAGNOSIS — L89153 Pressure ulcer of sacral region, stage 3: Secondary | ICD-10-CM | POA: Diagnosis not present

## 2017-08-25 DIAGNOSIS — Z7901 Long term (current) use of anticoagulants: Secondary | ICD-10-CM

## 2017-08-25 DIAGNOSIS — L89890 Pressure ulcer of other site, unstageable: Secondary | ICD-10-CM | POA: Diagnosis not present

## 2017-08-25 DIAGNOSIS — A4101 Sepsis due to Methicillin susceptible Staphylococcus aureus: Secondary | ICD-10-CM | POA: Diagnosis present

## 2017-08-25 DIAGNOSIS — I2510 Atherosclerotic heart disease of native coronary artery without angina pectoris: Secondary | ICD-10-CM | POA: Diagnosis not present

## 2017-08-25 LAB — POCT INR: INR: 2.1

## 2017-08-25 MED ORDER — CEPHALEXIN 250 MG PO CAPS: 250.0000 mg | ORAL_CAPSULE | Freq: Two times a day (BID) | ORAL | 11 refills | Status: AC

## 2017-08-25 NOTE — Telephone Encounter (Signed)
Done.  See coumadin note. 

## 2017-08-25 NOTE — Progress Notes (Signed)
[facility] for Infectious Disease  Patient Active Problem List   Diagnosis Date Noted  . Staphylococcus aureus bacteremia with sepsis [facility])     Priority: High  . Hypoglycemia   . Severe protein-calorie malnutrition ([place])   . Acute blood loss anemia   . Type 2 diabetes mellitus with peripheral neuropathy (HCC)   . Chronic systolic heart failure ([place])   . Colostomy care ([place])   . Chronic kidney disease (CKD), stage IV (severe) ([place])   . Dependence on renal dialysis ([place])   . Wounds, multiple   . Supratherapeutic INR   . MSSA bacteremia 08/02/2017  . ESRD (end stage renal disease) ([place])   . Debility   . Acute on chronic systolic CHF (congestive heart failure) ([place])   . Bacteremia due to methicillin susceptible Staphylococcus aureus (MSSA)   . Infected defibrillator ([place])   . Septic hip ([place])   . Severe sepsis with septic shock ([place])   . Cardiogenic shock ([place]) 07/10/2017  . Septic shock ([place]) 07/10/2017  . Cardiorenal syndrome with renal failure   . Thrombocytopenia ([place]) 06/14/2017  . GI bleed 04/27/2017  . Portal hypertensive gastropathy ([place]) 04/27/2017  . Hypokalemia 04/27/2017  . Esophageal dysphagia 04/13/2017  . Hepatic cirrhosis ([place]) 02/24/2017  . Acute on chronic systolic heart failure ([place])   . Biliary disease with obstruction   . AKI (acute kidney injury) ([place])   . Abnormal LFTs 05/10/2016  . Pain in the chest   . Right heart failure ([place]) 09/26/2015  . History of colonic polyps 09/24/2015  . Chronic atrial fibrillation ([place])   . Bleeding from colostomy stoma ([place]) 08/23/2015  . Anemia of chronic disease 07/21/2015  . Constipation 05/01/2015  . Transaminitis 05/01/2015  . NSTEMI- 04/23/15- Myoview scar- no cath 04/27/2015  . Acute respiratory failure ([place]) 04/23/2015  . Chronic cholecystitis 04/04/2015  . Cystitis with hematuria-April 2016 04/04/2015  . Chronically elevated transaminase level-Amiodarone resumed 04/24/15 04/03/2015  . H/O  ventricular fibrillation-March 2016 02/24/2015  . BiV ICD (St Jude) gen change 04/10/15 02/24/2015  . Hyperlipidemia   . Dietary noncompliance   . Orthostatic hypotension 07/19/2014  . OSA on CPAP   . GERD (gastroesophageal reflux disease) 06/05/2014  . Gallbladder polyp 03/06/2014  . DM type 2, uncontrolled, with renal complications ([place]) 46/96/2952  . Dizziness 01/26/2014  . Obesity 12/12/2013  . Chronic hypotension 12/11/2013  . Chronic kidney disease, stage IV (severe) ([place]) 12/11/2013  . Chronic a-fib ([place])   . CAD with LCX disease and stents to LAD 07/10/2013  . Long term current use of anticoagulant therapy 03/23/2013  . History of rectal cancer   . Ischemic cardiomyopathy-EF 35% 04/24/15 echo     Patient's Medications  New Prescriptions   No medications on file  Previous Medications   CEPHALEXIN (KEFLEX) 250 MG CAPSULE    Take 1 capsule (250 mg total) by mouth every 12 (twelve) hours.   COLCHICINE 0.6 MG TABLET    Take 0.5 tablets (0.3 mg total) by mouth daily.   COLLAGENASE (SANTYL) OINTMENT    Apply topically daily. Applied to affected areas directed   GLUCOSE BLOOD TEST STRIP    Use to test blood sugar 3 times daily   HYDROCODONE-ACETAMINOPHEN (NORCO) 7.5-325 MG TABLET    Take 1-2 tablets by mouth every 4 (four) hours as needed for moderate pain.   INSULIN ASPART (NOVOLOG) 100 UNIT/ML FLEXPEN    Inject 3 Units into the skin 3 (three) times  daily with meals.   INSULIN GLARGINE (LANTUS) 100 UNIT/ML SOLOSTAR PEN    Inject 8 Units into the skin daily at 10 pm.   KETOROLAC (TORADOL) 10 MG TABLET    TAKE 1 TABLET BY MOUTH 4 TIMES A DAY AS NEEDED FOR PAIN   LORATADINE (CLARITIN) 10 MG TABLET    Take 1 tablet (10 mg total) by mouth daily.   MIDODRINE (PROAMATINE) 10 MG TABLET    Take 1 tablet (10 mg total) by mouth 3 (three) times daily with meals.   MULTIVITAMIN (RENA-VIT) TABS TABLET    Take 1 tablet by mouth at bedtime.   NITROGLYCERIN (NITROSTAT) 0.4 MG SL TABLET    Place 1  tablet (0.4 mg total) under the tongue every 5 (five) minutes as needed for chest pain.   PANTOPRAZOLE (PROTONIX) 40 MG TABLET    Take 1 tablet (40 mg total) by mouth daily.   POLYETHYLENE GLYCOL (MIRALAX / GLYCOLAX) PACKET    Take 17 g by mouth daily as needed for mild constipation.   PROAIR RESPICLICK 789 (90 BASE) MCG/ACT AEPB    Inhale 1 puff into the lungs every 6 (six) hours as needed (for breathing).    SUCROFERRIC OXYHYDROXIDE (VELPHORO) 500 MG CHEWABLE TABLET    Chew 1 tablet (500 mg total) by mouth 3 (three) times daily with meals.   WARFARIN (COUMADIN) 1 MG TABLET    Take 1 tablet (1 mg total) by mouth daily at 6 PM.  Modified Medications   Modified Medication Previous Medication   CEPHALEXIN (KEFLEX) 250 MG CAPSULE cephALEXin (KEFLEX) 250 MG capsule      Take 1 capsule (250 mg total) by mouth 2 (two) times daily.    Take 1 capsule (250 mg total) by mouth 2 (two) times daily.  Discontinued Medications   No medications on file    Subjective: [name] is in with his family for his routine hospital follow-up visit. He was hospitalized last month with MSSA bacteremia. He has a permanent pacemaker in place and it was felt that it was not safe to remove. Her TEE did not show any evidence of endocarditis on his native valves or vegetations on the pacemaker wires. He also had severe, acute right hip pain suspicious for septic arthritis. I elected to treat him with 6 weeks of IV cephalexin which he completed on 08/20/2017. He was then put on renally adjusted oral cephalexin. He has had no problems tolerating his antibiotics. He is feeling much better. He has not had any fever, chills or sweats. His appetite is improved and he is getting stronger.  Review of Systems: Review of Systems  Constitutional: Negative for chills, diaphoresis and fever.  Respiratory: Positive for shortness of breath. Negative for cough.   Cardiovascular: Negative for chest pain.  Gastrointestinal: Negative for  abdominal pain, diarrhea, nausea and vomiting.    Past Medical History:  Diagnosis Date  . Adenocarcinoma of rectum ([place])    a. 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 AICD (automatic cardioverter/defibrillator) present 2002   BI V ICD  . Anemia   . CAD (coronary artery disease)    a. BMS to LAD 2001 at [facility] b. PTCA/atherectomy ramus and BMS to LAD 2009  . CHF (congestive heart failure) ([place])   . Cholelithiasis 06/2015  . Chronic kidney disease, stage IV (severe) ([place])   . Chronic systolic heart failure ([place])   . Cirrhosis ([place])   . Colostomy in place [facility])   . Dizziness    a. chronic. Admission for  this 07/18/2014  . DM type 2, uncontrolled, with renal complications ([place])   . Dysrhythmia   . Essential hypertension, benign   . GERD (gastroesophageal reflux disease)   . HCAP (healthcare-associated pneumonia) 07/21/2015  . Hematuria   . History of blood transfusion    "I've had 2 units so far this year" (09/27/2015)  . HLD (hyperlipidemia)   . Ischemic cardiomyopathy    EF 18% by nuclear study 2016, multiple myocardial infarctions in past    . Myocardial infarction ([place]) 2001  . Obesity   . Orthostatic hypotension   . OSA on CPAP   . Paroxysmal atrial fibrillation (HCC)    a. on amiodarone, digoxin and Eliquis  . PONV (postoperative nausea and vomiting)   . Prostate cancer ([place])    a. s/p seed implants with chemo and radiation  . SAH (subarachnoid hemorrhage) ([place])    post-traumatic (fall) [facility] 12/2014  . TIA (transient ischemic attack)     Social History  Substance Use Topics  . Smoking status: Never Smoker  . Smokeless tobacco: Never Used  . Alcohol use No     Comment: Former user 45 years ago    Family History  Problem Relation Age of Onset  . Colon cancer Mother 19  . Coronary artery disease Father   . Colon cancer Sister 34  . Diabetes Sister   . Colon cancer Other        2 cousins, succumbed to illness    No Known Allergies  Objective: Vitals:   08/25/17 0938  BP: 105/66    Pulse: 77  Temp: 98.2 F (36.8 C)  TempSrc: Oral  Weight: 170 lb (77.1 kg)  Height: 5\' 10"  (1.778 m)   Body mass index is 24.39 kg/m.  Physical Exam  Constitutional: He is oriented to person, place, and time.  He is seated in a wheelchair. He appears weak but otherwise in good spirits.  Cardiovascular: Regular rhythm.   Murmur heard. 2/6 systolic murmur.  Pulmonary/Chest: Effort normal and breath sounds normal. He has no wheezes. He has no rales.  His pacemaker site looks good.  Musculoskeletal:  Right upper arm fistula looks good.  Neurological: He is alert and oriented to person, place, and time.  Skin: No rash noted.  Scattered ecchymoses on his extremities. His legs are wrapped.  Psychiatric: Mood and affect normal.    Lab Results    Problem List Items Addressed This Visit      High   Staphylococcus aureus bacteremia with sepsis [facility])    He has improved on therapy for MSSA bacteremia. I talked to him and his family about the uncertainty over whether or not he is cured at this point or whether we are simply suppressing infection on his pacemaker wires. He is tolerating cephalexin and agrees that continuing cephalexin indefinitely is the best option at this point. He will follow-up here in 3 months.      Relevant Medications   cephALEXin (KEFLEX) 250 MG capsule       Robert Bickers, MD [facility] for Infectious [place] [phone] pager   [phone] cell 08/25/2017, 9:52 AM

## 2017-08-25 NOTE — Telephone Encounter (Signed)
Confirmed remote transmission w/ pt wife.   

## 2017-08-25 NOTE — Telephone Encounter (Signed)
Per VM from [place] [phone]   INR 2.1

## 2017-08-25 NOTE — Assessment & Plan Note (Signed)
He has improved on therapy for MSSA bacteremia. I talked to him and his family about the uncertainty over whether or not he is cured at this point or whether we are simply suppressing infection on his pacemaker wires. He is tolerating cephalexin and agrees that continuing cephalexin indefinitely is the best option at this point. He will follow-up here in 3 months.

## 2017-08-26 ENCOUNTER — Encounter: Payer: Self-pay | Admitting: Cardiology

## 2017-08-26 DIAGNOSIS — Z23 Encounter for immunization: Secondary | ICD-10-CM | POA: Diagnosis not present

## 2017-08-26 DIAGNOSIS — N186 End stage renal disease: Secondary | ICD-10-CM | POA: Diagnosis not present

## 2017-08-26 DIAGNOSIS — D509 Iron deficiency anemia, unspecified: Secondary | ICD-10-CM | POA: Diagnosis not present

## 2017-08-29 ENCOUNTER — Other Ambulatory Visit: Payer: Self-pay | Admitting: Internal Medicine

## 2017-08-29 DIAGNOSIS — D509 Iron deficiency anemia, unspecified: Secondary | ICD-10-CM | POA: Diagnosis not present

## 2017-08-29 DIAGNOSIS — N186 End stage renal disease: Secondary | ICD-10-CM | POA: Diagnosis not present

## 2017-08-29 DIAGNOSIS — Z23 Encounter for immunization: Secondary | ICD-10-CM | POA: Diagnosis not present

## 2017-08-30 DIAGNOSIS — L89610 Pressure ulcer of right heel, unstageable: Secondary | ICD-10-CM | POA: Diagnosis not present

## 2017-08-30 DIAGNOSIS — L89890 Pressure ulcer of other site, unstageable: Secondary | ICD-10-CM | POA: Diagnosis not present

## 2017-08-30 DIAGNOSIS — I2510 Atherosclerotic heart disease of native coronary artery without angina pectoris: Secondary | ICD-10-CM | POA: Diagnosis not present

## 2017-08-30 DIAGNOSIS — L89620 Pressure ulcer of left heel, unstageable: Secondary | ICD-10-CM | POA: Diagnosis not present

## 2017-08-30 DIAGNOSIS — L89153 Pressure ulcer of sacral region, stage 3: Secondary | ICD-10-CM | POA: Diagnosis not present

## 2017-08-30 DIAGNOSIS — E1142 Type 2 diabetes mellitus with diabetic polyneuropathy: Secondary | ICD-10-CM | POA: Diagnosis not present

## 2017-08-31 DIAGNOSIS — Z23 Encounter for immunization: Secondary | ICD-10-CM | POA: Diagnosis not present

## 2017-08-31 DIAGNOSIS — D509 Iron deficiency anemia, unspecified: Secondary | ICD-10-CM | POA: Diagnosis not present

## 2017-08-31 DIAGNOSIS — N186 End stage renal disease: Secondary | ICD-10-CM | POA: Diagnosis not present

## 2017-09-01 ENCOUNTER — Ambulatory Visit (INDEPENDENT_AMBULATORY_CARE_PROVIDER_SITE_OTHER): Payer: Medicare Other | Admitting: *Deleted

## 2017-09-01 ENCOUNTER — Ambulatory Visit (HOSPITAL_BASED_OUTPATIENT_CLINIC_OR_DEPARTMENT_OTHER)
Admission: RE | Admit: 2017-09-01 | Discharge: 2017-09-01 | Disposition: A | Discharge status: Home / Self Care | Payer: Medicare Other | Source: Ambulatory Visit | Attending: Internal Medicine | Admitting: Internal Medicine

## 2017-09-01 ENCOUNTER — Encounter (HOSPITAL_COMMUNITY): Payer: Self-pay | Admitting: Internal Medicine

## 2017-09-01 VITALS — BP 102/60 | HR 56 | Wt 170.4 lb

## 2017-09-01 DIAGNOSIS — Z85048 Personal history of other malignant neoplasm of rectum, rectosigmoid junction, and anus: Secondary | ICD-10-CM | POA: Insufficient documentation

## 2017-09-01 DIAGNOSIS — E871 Hypo-osmolality and hyponatremia: Secondary | ICD-10-CM | POA: Diagnosis not present

## 2017-09-01 DIAGNOSIS — E440 Moderate protein-calorie malnutrition: Secondary | ICD-10-CM | POA: Diagnosis not present

## 2017-09-01 DIAGNOSIS — E1142 Type 2 diabetes mellitus with diabetic polyneuropathy: Secondary | ICD-10-CM | POA: Diagnosis not present

## 2017-09-01 DIAGNOSIS — G4733 Obstructive sleep apnea (adult) (pediatric): Secondary | ICD-10-CM

## 2017-09-01 DIAGNOSIS — I5022 Chronic systolic (congestive) heart failure: Secondary | ICD-10-CM | POA: Diagnosis not present

## 2017-09-01 DIAGNOSIS — I132 Hypertensive heart and chronic kidney disease with heart failure and with stage 5 chronic kidney disease, or end stage renal disease: Secondary | ICD-10-CM

## 2017-09-01 DIAGNOSIS — Z79899 Other long term (current) drug therapy: Secondary | ICD-10-CM | POA: Insufficient documentation

## 2017-09-01 DIAGNOSIS — Z794 Long term (current) use of insulin: Secondary | ICD-10-CM

## 2017-09-01 DIAGNOSIS — L89610 Pressure ulcer of right heel, unstageable: Secondary | ICD-10-CM | POA: Diagnosis not present

## 2017-09-01 DIAGNOSIS — I2510 Atherosclerotic heart disease of native coronary artery without angina pectoris: Secondary | ICD-10-CM | POA: Insufficient documentation

## 2017-09-01 DIAGNOSIS — I482 Chronic atrial fibrillation, unspecified: Secondary | ICD-10-CM

## 2017-09-01 DIAGNOSIS — Z8546 Personal history of malignant neoplasm of prostate: Secondary | ICD-10-CM | POA: Insufficient documentation

## 2017-09-01 DIAGNOSIS — L89154 Pressure ulcer of sacral region, stage 4: Secondary | ICD-10-CM | POA: Diagnosis not present

## 2017-09-01 DIAGNOSIS — E1122 Type 2 diabetes mellitus with diabetic chronic kidney disease: Secondary | ICD-10-CM | POA: Insufficient documentation

## 2017-09-01 DIAGNOSIS — L89890 Pressure ulcer of other site, unstageable: Secondary | ICD-10-CM | POA: Diagnosis not present

## 2017-09-01 DIAGNOSIS — I255 Ischemic cardiomyopathy: Secondary | ICD-10-CM

## 2017-09-01 DIAGNOSIS — N186 End stage renal disease: Secondary | ICD-10-CM | POA: Diagnosis not present

## 2017-09-01 DIAGNOSIS — Z7901 Long term (current) use of anticoagulants: Secondary | ICD-10-CM

## 2017-09-01 DIAGNOSIS — L89153 Pressure ulcer of sacral region, stage 3: Secondary | ICD-10-CM | POA: Diagnosis not present

## 2017-09-01 DIAGNOSIS — J984 Other disorders of lung: Secondary | ICD-10-CM | POA: Diagnosis not present

## 2017-09-01 DIAGNOSIS — J189 Pneumonia, unspecified organism: Secondary | ICD-10-CM | POA: Diagnosis not present

## 2017-09-01 DIAGNOSIS — I252 Old myocardial infarction: Secondary | ICD-10-CM

## 2017-09-01 DIAGNOSIS — L89620 Pressure ulcer of left heel, unstageable: Secondary | ICD-10-CM | POA: Diagnosis not present

## 2017-09-01 NOTE — Progress Notes (Signed)
Patient ID: [name], male   DOB: 06/25/1956, 61 y.o.   MRN: [id number]   ADVANCED HF CLINIC NOTE  PCP: Dr. [name] Oncologist: Dr Truett Perna.  CHF: [name]  61 yo with history of CAD and systolic HF due to ischemic cardiomyopathy EF 20-25%, BiV ICD upgrade 2009, chronic atrial fibrillation s/p AVN ablation 9/16, CKD, and traumatic SAH in 1/16 after a fall.  He has been followed at the heart failure clinic at [facility] by Dr Devonne Doughty in the past.   He was admitted in 3/16 from [facility] with exertional dyspnea/volume overload and had a cardiac arrest/ventricular fibrillation while in the hospital terminated by his ICD. After diuresis, RHC showed relatively normal filling pressures and preserved cardiac index.  Creatinine peaked at 3.0. (was 5.0 in 1/16).  Echo in 3/16 showed EF 25-30% with restrictive diastolic function.  Cardiolite was done, showing areas of scar but no ischemia.  EF 18%.    Admitted 4/16 with hematuria and elevated LFTs. Questionable amiodarone toxicity and this was stopped.   In 5/16 underwent elective BiV ICD generator change which went without complication. He was admitted again 04/23/15 with acute on chronic CHF and respiratory failure. During that admission he ruled in for a NSTEMI- Troponin 4.29. He has chronic stage 4 CKD and it was decided to obtain a Myoview before considering a cath. Myoview 04/26/15 showed EF 20% with large region of prior infarct involving the apical anterior, anteroseptal, inferoseptal and inferior walls with extension to the true apex. Small region of reversible/inducible ischemia along the margin of the prior infarct at the mid ventricular anterior and anteroseptal walls. No change from previous.   Redmitted in 9/16 with bleeding from stoma site and atrial fibrillation with RVR.  Eliquis was held and bleeding stopped.  Eliquis was restarted prior to discharge. He was noted to have < 50% BiV pacing and also to be very volume overloaded. He was then  diuresed and had AV nodal ablation.    Readmitted 7/17 with cholangitis and sepsis. Required ERCP with biliary stent. C/b acute on chronic renal failure requiring HD.   Had prolonged hospitalization in 8/18 for MSSA bacteremia/sepsis c/b shock and worsening renal failure. Developed ESRD and HD started. TEE negative for vegetation. EF 15-20%. RV down. Hospitalization c/b severe debility, malnutrition and skin wounds. Was d/c'd to CIR for several weeks. Completed IV ANCEF course.   Returns for post-hospital f/u. Remains very weak. Uses a walker around the house. Working with HHPT. Feels he is getting somewhat stronger. Weight stable with HD. Still makes some urine but not much. No orthopnea or PND. + edema  Corevue: Chronic AF no VT/VF. Fluid persistently elevated   Labs (12/15): LDL 123 Labs (3/16): K 3.8, creatinine 2.78 Labs (03/10/2015): K 4.5 Creatinine 2.49  TSH 4.58, LFTs normal, digoxin 0.8 Labs (04/26/15): K 3.5 creatinine 2.13 Labs (08/04/2015): K 3.3 Creatinine 3.01  Labs (9/16): K 4.1, creatinine 2.7, HCT 30.8 Labs (4/17): K 3.5, creatinine 2.67 Labs (05/12/16): K 3.7, creatinine 2.66 Labs (10/20/16): K 3.6, creatinine 2.71 Labs (01/26/17) K 3.1, creatinine 2.70  PMH: 1. HTN 2. Type II diabetes 3. CAD: s/p BMS LAD in 2001, PTCA ramus and BMS LAD in 2009.  Cardiolite (3/16) with EF 18%, no ischemia, prior anterior, apical and inferior infarction. NSTEMI in 5/16.  Myoview done 04/26/15 showed EF 20% with large region of prior infarct involving the apical anterior, anteroseptal, inferoseptal and inferior walls with extension to the true apex. Small region of reversible/inducible ischemia  along the margin of the prior infarct at the mid ventricular anterior and anteroseptal walls. No change from previous.  4. Chronic systolic CHF: Ischemic CMP.  St Jude CRT-D.  Echo (3/16) with EF 25-30%, restrictive diastolic function, mild LVH, mild MR.  RHC (3/16) with mean RA 9, PA 47/29, mean PCWP 16, CI  2.5 ([place]).  Suspect ACEI cough. Echo (7/17) with EF 15-20%. Myoview 18%.   5. SAH: 1/16 after fall (traumatic).   6. GERD 7. Atrial fibrillation: Paroxysmal initially but now chronic.  Now s/p AV nodal ablation to allow BiV pacing. 8. Rectal cancer: s/p surgery. Has colostomy.  9. Prostate cancer: s/p chemo/radiation.  10. H/o TIA 11. OSA: On CPAP.  12. Hyperlipidemia 13. CKD stage IV: Followed by Dr Kristian Covey.  14. Bleeding from stoma.   SH: Married, lives in [place], nonsmoker.   FH: CAD  ROS: All systems reviewed and negative except as per HPI.   Current Outpatient Prescriptions  Medication Sig Dispense Refill  . cephALEXin (KEFLEX) 250 MG capsule Take 1 capsule (250 mg total) by mouth 2 (two) times daily. 60 capsule 11  . colchicine 0.6 MG tablet Take 0.5 tablets (0.3 mg total) by mouth daily. 30 tablet 0  . collagenase (SANTYL) ointment Apply topically daily. Applied to affected areas directed 15 g 0  . glucose blood test strip Use to test blood sugar 3 times daily 100 each 4  . HYDROcodone-acetaminophen (NORCO) 7.5-325 MG tablet Take 1-2 tablets by mouth every 4 (four) hours as needed for moderate pain. 30 tablet 0  . insulin aspart (NOVOLOG) 100 UNIT/ML FlexPen Inject 3 Units into the skin 3 (three) times daily with meals. 15 mL 11  . Insulin Glargine (LANTUS) 100 UNIT/ML Solostar Pen Inject 8 Units into the skin daily at 10 pm. 15 mL 11  . ketorolac (TORADOL) 10 MG tablet TAKE 1 TABLET BY MOUTH 4 TIMES A DAY AS NEEDED FOR PAIN  0  . loratadine (CLARITIN) 10 MG tablet Take 1 tablet (10 mg total) by mouth daily. 30 tablet 0  . midodrine (PROAMATINE) 10 MG tablet Take 1 tablet (10 mg total) by mouth 3 (three) times daily with meals. 90 tablet 0  . multivitamin (RENA-VIT) TABS tablet Take 1 tablet by mouth at bedtime. 30 tablet 0  . nitroGLYCERIN (NITROSTAT) 0.4 MG SL tablet Place 1 tablet (0.4 mg total) under the tongue every 5 (five) minutes as needed for chest pain. 25 tablet  3  . pantoprazole (PROTONIX) 40 MG tablet Take 1 tablet (40 mg total) by mouth daily. 30 tablet 0  . polyethylene glycol (MIRALAX / GLYCOLAX) packet Take 17 g by mouth daily as needed for mild constipation. 14 each 0  . PROAIR RESPICLICK 108 (90 BASE) MCG/ACT AEPB Inhale 1 puff into the lungs every 6 (six) hours as needed (for breathing).   0  . sucroferric oxyhydroxide (VELPHORO) 500 MG chewable tablet Chew 1 tablet (500 mg total) by mouth 3 (three) times daily with meals. 90 tablet 0  . warfarin (COUMADIN) 1 MG tablet Take 1 tablet (1 mg total) by mouth daily at 6 PM. 30 tablet 0   No current facility-administered medications for this encounter.     Vitals:   09/01/17 1524  BP: 102/60  Pulse: (!) 56  SpO2: 91%  Weight: 170 lb 6.4 oz (77.3 kg)    Wt Readings from Last 3 Encounters:  09/01/17 170 lb 6.4 oz (77.3 kg)  08/25/17 170 lb (77.1 kg)  08/20/17 176  lb 5.9 oz (80 kg)   General: Cachetic. Weak appearing. Sallow  No resp difficulty HEENT: normal Neck: supple. JVP to jawCarotids 2+ bilat; no bruits. No lymphadenopathy or thryomegaly appreciated. Cor: PMI laterally displaced. Regular rate & rhythm. 2/6 MR/TR Lungs: clear Abdomen: soft, nontender, + distended. No hepatosplenomegaly. No bruits or masses. Good bowel sounds. + LLQ ostomy bag Extremities: no cyanosis, clubbing, rash, 2+ woody edema. Healing wounds  Neuro: alert & orientedx3, cranial nerves grossly intact. moves all 4 extremities w/o difficulty. Affect pleasant   Assessment/Plan: 1. Chronic systolic CHF: Ischemic cardiomyopathy.  - Echo 07/2017 with LVEF 15-20%, - He has end-stage HF, NYHA IIIB-IV and is now dialysis dependent - Volume status remain elevated on exam and by Corevue - We previously discussed Hospice but he is not interested. - I told him we would continue HHPT for now and see if he can continue to make progress. If not progressing can readdress GOC. Not candidate for advanced therapies.  -Volume  elevated. Have asked him to discuss with HD team about reducing dry weight as BP tolerates.  -Off HF meds due to ESRD and low BP. Continue midodrine 2. Recent MSSA bacteremia -Finished IV abx -On suppressive Keflex 3. Atrial fibrillation: Chronic. CHADS2Vasc Score 5. s/p AV nodal ablation -  s/p AV node ablation.  - On warfarin. 4. ESRD - As above. Volume status elevated. Push dry weight as possible.  - Continue midodrine   5. CAD:  NSTEMI in 5/16.  - Cardiolite without high ischemic burden. EF is decreased from previous, but poor cath candidate with renal failure. - No ASA given AC - Denies chest pain.    6. H/O rectal cancer 2008: Has diverting colostomy. Follows with Dr. Truett Perna.  - CT (05/02/16) without evidence of reoccurrence.   Prognosis Guarded. Discussion as above.      Arvilla Meres, MD 09/01/17

## 2017-09-01 NOTE — Patient Instructions (Signed)
Your physician recommends that you schedule a follow-up appointment in: 6 weeks  

## 2017-09-02 ENCOUNTER — Telehealth: Payer: Self-pay | Admitting: Cardiology

## 2017-09-02 ENCOUNTER — Ambulatory Visit (INDEPENDENT_AMBULATORY_CARE_PROVIDER_SITE_OTHER): Payer: Medicare Other

## 2017-09-02 DIAGNOSIS — I482 Chronic atrial fibrillation, unspecified: Secondary | ICD-10-CM

## 2017-09-02 DIAGNOSIS — Z7901 Long term (current) use of anticoagulants: Secondary | ICD-10-CM | POA: Diagnosis not present

## 2017-09-02 DIAGNOSIS — I2510 Atherosclerotic heart disease of native coronary artery without angina pectoris: Secondary | ICD-10-CM | POA: Diagnosis not present

## 2017-09-02 DIAGNOSIS — N186 End stage renal disease: Secondary | ICD-10-CM | POA: Diagnosis not present

## 2017-09-02 DIAGNOSIS — L89610 Pressure ulcer of right heel, unstageable: Secondary | ICD-10-CM | POA: Diagnosis not present

## 2017-09-02 DIAGNOSIS — Z23 Encounter for immunization: Secondary | ICD-10-CM | POA: Diagnosis not present

## 2017-09-02 DIAGNOSIS — L89620 Pressure ulcer of left heel, unstageable: Secondary | ICD-10-CM | POA: Diagnosis not present

## 2017-09-02 DIAGNOSIS — E1142 Type 2 diabetes mellitus with diabetic polyneuropathy: Secondary | ICD-10-CM | POA: Diagnosis not present

## 2017-09-02 DIAGNOSIS — L89153 Pressure ulcer of sacral region, stage 3: Secondary | ICD-10-CM | POA: Diagnosis not present

## 2017-09-02 DIAGNOSIS — D509 Iron deficiency anemia, unspecified: Secondary | ICD-10-CM | POA: Diagnosis not present

## 2017-09-02 DIAGNOSIS — L89890 Pressure ulcer of other site, unstageable: Secondary | ICD-10-CM | POA: Diagnosis not present

## 2017-09-02 LAB — POCT INR: INR: 1.8

## 2017-09-02 NOTE — Telephone Encounter (Signed)
Result addressed see anticoagulation note in Epic. Will forward to [facility] as Juluis Rainier.

## 2017-09-02 NOTE — Telephone Encounter (Signed)
[name] with [facility] called with  Please call [phone]. PT 21.4 INR 1.8

## 2017-09-02 NOTE — Progress Notes (Signed)
Remote ICD transmission.   

## 2017-09-03 ENCOUNTER — Encounter (HOSPITAL_COMMUNITY): Payer: Self-pay

## 2017-09-03 ENCOUNTER — Inpatient Hospital Stay (HOSPITAL_COMMUNITY)
Admission: EM | Admit: 2017-09-03 | Discharge: 2017-09-15 | DRG: 193 | Disposition: A | Discharge status: Skilled Nursing Facility | Payer: Medicare Other | Attending: Internal Medicine | Admitting: Internal Medicine

## 2017-09-03 ENCOUNTER — Inpatient Hospital Stay (HOSPITAL_COMMUNITY): Payer: Medicare Other

## 2017-09-03 ENCOUNTER — Emergency Department (HOSPITAL_COMMUNITY): Payer: Medicare Other

## 2017-09-03 DIAGNOSIS — R042 Hemoptysis: Secondary | ICD-10-CM | POA: Diagnosis present

## 2017-09-03 DIAGNOSIS — I252 Old myocardial infarction: Secondary | ICD-10-CM | POA: Diagnosis not present

## 2017-09-03 DIAGNOSIS — Z6822 Body mass index (BMI) 22.0-22.9, adult: Secondary | ICD-10-CM

## 2017-09-03 DIAGNOSIS — J90 Pleural effusion, not elsewhere classified: Secondary | ICD-10-CM | POA: Diagnosis present

## 2017-09-03 DIAGNOSIS — L89159 Pressure ulcer of sacral region, unspecified stage: Secondary | ICD-10-CM | POA: Diagnosis not present

## 2017-09-03 DIAGNOSIS — K920 Hematemesis: Secondary | ICD-10-CM | POA: Diagnosis not present

## 2017-09-03 DIAGNOSIS — I132 Hypertensive heart and chronic kidney disease with heart failure and with stage 5 chronic kidney disease, or end stage renal disease: Secondary | ICD-10-CM | POA: Diagnosis present

## 2017-09-03 DIAGNOSIS — Z95810 Presence of automatic (implantable) cardiac defibrillator: Secondary | ICD-10-CM

## 2017-09-03 DIAGNOSIS — R945 Abnormal results of liver function studies: Secondary | ICD-10-CM | POA: Diagnosis not present

## 2017-09-03 DIAGNOSIS — K219 Gastro-esophageal reflux disease without esophagitis: Secondary | ICD-10-CM | POA: Diagnosis present

## 2017-09-03 DIAGNOSIS — IMO0002 Reserved for concepts with insufficient information to code with codable children: Secondary | ICD-10-CM | POA: Diagnosis present

## 2017-09-03 DIAGNOSIS — I509 Heart failure, unspecified: Secondary | ICD-10-CM | POA: Diagnosis not present

## 2017-09-03 DIAGNOSIS — I255 Ischemic cardiomyopathy: Secondary | ICD-10-CM | POA: Diagnosis not present

## 2017-09-03 DIAGNOSIS — N2581 Secondary hyperparathyroidism of renal origin: Secondary | ICD-10-CM | POA: Diagnosis present

## 2017-09-03 DIAGNOSIS — R091 Pleurisy: Secondary | ICD-10-CM | POA: Diagnosis not present

## 2017-09-03 DIAGNOSIS — R0602 Shortness of breath: Secondary | ICD-10-CM | POA: Diagnosis not present

## 2017-09-03 DIAGNOSIS — E1129 Type 2 diabetes mellitus with other diabetic kidney complication: Secondary | ICD-10-CM | POA: Diagnosis present

## 2017-09-03 DIAGNOSIS — Z87820 Personal history of traumatic brain injury: Secondary | ICD-10-CM | POA: Diagnosis not present

## 2017-09-03 DIAGNOSIS — Z85048 Personal history of other malignant neoplasm of rectum, rectosigmoid junction, and anus: Secondary | ICD-10-CM

## 2017-09-03 DIAGNOSIS — E1165 Type 2 diabetes mellitus with hyperglycemia: Secondary | ICD-10-CM

## 2017-09-03 DIAGNOSIS — M109 Gout, unspecified: Secondary | ICD-10-CM | POA: Diagnosis not present

## 2017-09-03 DIAGNOSIS — I480 Paroxysmal atrial fibrillation: Secondary | ICD-10-CM | POA: Diagnosis present

## 2017-09-03 DIAGNOSIS — J80 Acute respiratory distress syndrome: Secondary | ICD-10-CM | POA: Diagnosis not present

## 2017-09-03 DIAGNOSIS — Z8673 Personal history of transient ischemic attack (TIA), and cerebral infarction without residual deficits: Secondary | ICD-10-CM

## 2017-09-03 DIAGNOSIS — Z933 Colostomy status: Secondary | ICD-10-CM

## 2017-09-03 DIAGNOSIS — E871 Hypo-osmolality and hyponatremia: Secondary | ICD-10-CM | POA: Diagnosis not present

## 2017-09-03 DIAGNOSIS — R64 Cachexia: Secondary | ICD-10-CM | POA: Diagnosis present

## 2017-09-03 DIAGNOSIS — I5022 Chronic systolic (congestive) heart failure: Secondary | ICD-10-CM | POA: Diagnosis present

## 2017-09-03 DIAGNOSIS — Z09 Encounter for follow-up examination after completed treatment for conditions other than malignant neoplasm: Secondary | ICD-10-CM

## 2017-09-03 DIAGNOSIS — Z9889 Other specified postprocedural states: Secondary | ICD-10-CM

## 2017-09-03 DIAGNOSIS — M6281 Muscle weakness (generalized): Secondary | ICD-10-CM | POA: Diagnosis not present

## 2017-09-03 DIAGNOSIS — E11621 Type 2 diabetes mellitus with foot ulcer: Secondary | ICD-10-CM | POA: Diagnosis present

## 2017-09-03 DIAGNOSIS — Z8546 Personal history of malignant neoplasm of prostate: Secondary | ICD-10-CM

## 2017-09-03 DIAGNOSIS — R1313 Dysphagia, pharyngeal phase: Secondary | ICD-10-CM | POA: Diagnosis not present

## 2017-09-03 DIAGNOSIS — J189 Pneumonia, unspecified organism: Secondary | ICD-10-CM | POA: Diagnosis not present

## 2017-09-03 DIAGNOSIS — G4733 Obstructive sleep apnea (adult) (pediatric): Secondary | ICD-10-CM | POA: Diagnosis not present

## 2017-09-03 DIAGNOSIS — Z992 Dependence on renal dialysis: Secondary | ICD-10-CM | POA: Diagnosis not present

## 2017-09-03 DIAGNOSIS — J984 Other disorders of lung: Secondary | ICD-10-CM | POA: Diagnosis not present

## 2017-09-03 DIAGNOSIS — Z800 Family history of malignant neoplasm of digestive organs: Secondary | ICD-10-CM

## 2017-09-03 DIAGNOSIS — L89154 Pressure ulcer of sacral region, stage 4: Secondary | ICD-10-CM | POA: Diagnosis present

## 2017-09-03 DIAGNOSIS — I2510 Atherosclerotic heart disease of native coronary artery without angina pectoris: Secondary | ICD-10-CM | POA: Diagnosis present

## 2017-09-03 DIAGNOSIS — R069 Unspecified abnormalities of breathing: Secondary | ICD-10-CM | POA: Diagnosis not present

## 2017-09-03 DIAGNOSIS — R918 Other nonspecific abnormal finding of lung field: Secondary | ICD-10-CM | POA: Diagnosis not present

## 2017-09-03 DIAGNOSIS — Y95 Nosocomial condition: Secondary | ICD-10-CM | POA: Diagnosis present

## 2017-09-03 DIAGNOSIS — D539 Nutritional anemia, unspecified: Secondary | ICD-10-CM | POA: Diagnosis present

## 2017-09-03 DIAGNOSIS — Z955 Presence of coronary angioplasty implant and graft: Secondary | ICD-10-CM

## 2017-09-03 DIAGNOSIS — D696 Thrombocytopenia, unspecified: Secondary | ICD-10-CM | POA: Diagnosis not present

## 2017-09-03 DIAGNOSIS — R1311 Dysphagia, oral phase: Secondary | ICD-10-CM | POA: Diagnosis not present

## 2017-09-03 DIAGNOSIS — R7989 Other specified abnormal findings of blood chemistry: Secondary | ICD-10-CM | POA: Diagnosis present

## 2017-09-03 DIAGNOSIS — E440 Moderate protein-calorie malnutrition: Secondary | ICD-10-CM | POA: Diagnosis not present

## 2017-09-03 DIAGNOSIS — E1122 Type 2 diabetes mellitus with diabetic chronic kidney disease: Secondary | ICD-10-CM | POA: Diagnosis present

## 2017-09-03 DIAGNOSIS — Z8619 Personal history of other infectious and parasitic diseases: Secondary | ICD-10-CM

## 2017-09-03 DIAGNOSIS — Z7901 Long term (current) use of anticoagulants: Secondary | ICD-10-CM

## 2017-09-03 DIAGNOSIS — Z833 Family history of diabetes mellitus: Secondary | ICD-10-CM

## 2017-09-03 DIAGNOSIS — L89610 Pressure ulcer of right heel, unstageable: Secondary | ICD-10-CM | POA: Diagnosis not present

## 2017-09-03 DIAGNOSIS — J690 Pneumonitis due to inhalation of food and vomit: Secondary | ICD-10-CM | POA: Diagnosis present

## 2017-09-03 DIAGNOSIS — K7460 Unspecified cirrhosis of liver: Secondary | ICD-10-CM | POA: Diagnosis not present

## 2017-09-03 DIAGNOSIS — Z658 Other specified problems related to psychosocial circumstances: Secondary | ICD-10-CM | POA: Diagnosis not present

## 2017-09-03 DIAGNOSIS — N186 End stage renal disease: Secondary | ICD-10-CM | POA: Diagnosis not present

## 2017-09-03 DIAGNOSIS — F4323 Adjustment disorder with mixed anxiety and depressed mood: Secondary | ICD-10-CM | POA: Diagnosis not present

## 2017-09-03 DIAGNOSIS — I482 Chronic atrial fibrillation: Secondary | ICD-10-CM | POA: Diagnosis not present

## 2017-09-03 DIAGNOSIS — Z8249 Family history of ischemic heart disease and other diseases of the circulatory system: Secondary | ICD-10-CM

## 2017-09-03 DIAGNOSIS — L89890 Pressure ulcer of other site, unstageable: Secondary | ICD-10-CM | POA: Diagnosis not present

## 2017-09-03 DIAGNOSIS — N184 Chronic kidney disease, stage 4 (severe): Secondary | ICD-10-CM | POA: Diagnosis not present

## 2017-09-03 DIAGNOSIS — Z794 Long term (current) use of insulin: Secondary | ICD-10-CM

## 2017-09-03 DIAGNOSIS — Z48813 Encounter for surgical aftercare following surgery on the respiratory system: Secondary | ICD-10-CM | POA: Diagnosis not present

## 2017-09-03 DIAGNOSIS — D638 Anemia in other chronic diseases classified elsewhere: Secondary | ICD-10-CM | POA: Diagnosis not present

## 2017-09-03 DIAGNOSIS — I9589 Other hypotension: Secondary | ICD-10-CM | POA: Diagnosis present

## 2017-09-03 DIAGNOSIS — E1121 Type 2 diabetes mellitus with diabetic nephropathy: Secondary | ICD-10-CM | POA: Diagnosis not present

## 2017-09-03 DIAGNOSIS — L97519 Non-pressure chronic ulcer of other part of right foot with unspecified severity: Secondary | ICD-10-CM | POA: Diagnosis present

## 2017-09-03 DIAGNOSIS — R2681 Unsteadiness on feet: Secondary | ICD-10-CM | POA: Diagnosis not present

## 2017-09-03 HISTORY — DX: Presence of cardiac pacemaker: Z95.0

## 2017-09-03 LAB — COMPREHENSIVE METABOLIC PANEL
ALK PHOS: 116 U/L (ref 38–126)
ALT: 14 U/L — ABNORMAL LOW (ref 17–63)
ANION GAP: 13 (ref 5–15)
AST: 53 U/L — ABNORMAL HIGH (ref 15–41)
Albumin: 2.3 g/dL — ABNORMAL LOW (ref 3.5–5.0)
BILIRUBIN TOTAL: 3.2 mg/dL — AB (ref 0.3–1.2)
BUN: 16 mg/dL (ref 6–20)
CALCIUM: 8.7 mg/dL — AB (ref 8.9–10.3)
CO2: 25 mmol/L (ref 22–32)
Chloride: 96 mmol/L — ABNORMAL LOW (ref 101–111)
Creatinine, Ser: 2.76 mg/dL — ABNORMAL HIGH (ref 0.61–1.24)
GFR, EST AFRICAN AMERICAN: 27 mL/min — AB (ref >60)
GFR, EST NON AFRICAN AMERICAN: 23 mL/min — AB (ref >60)
Glucose, Bld: 147 mg/dL — ABNORMAL HIGH (ref 65–99)
Potassium: 4.3 mmol/L (ref 3.5–5.1)
SODIUM: 134 mmol/L — AB (ref 135–145)
TOTAL PROTEIN: 6.7 g/dL (ref 6.5–8.1)

## 2017-09-03 LAB — GLUCOSE, CAPILLARY
GLUCOSE-CAPILLARY: 176 mg/dL — AB (ref 65–99)
GLUCOSE-CAPILLARY: 107 mg/dL — AB (ref 65–99)
Glucose-Capillary: 178 mg/dL — ABNORMAL HIGH (ref 65–99)
Glucose-Capillary: 116 mg/dL — ABNORMAL HIGH (ref 65–99)

## 2017-09-03 LAB — HEMOGLOBIN A1C
HEMOGLOBIN A1C: 5 % (ref 4.8–5.6)
MEAN PLASMA GLUCOSE: 96.8 mg/dL

## 2017-09-03 LAB — I-STAT TROPONIN, ED: TROPONIN I, POC: 0.03 ng/mL (ref 0.00–0.08)

## 2017-09-03 LAB — PROTIME-INR
INR: 2.37
PROTHROMBIN TIME: 25.7 s — AB (ref 11.4–15.2)

## 2017-09-03 LAB — CBC WITH DIFFERENTIAL/PLATELET
BASOS PCT: 0 %
Basophils Absolute: 0 10*3/uL (ref 0.0–0.1)
EOS ABS: 0 10*3/uL (ref 0.0–0.7)
Eosinophils Relative: 0 %
HCT: 32.5 % — ABNORMAL LOW (ref 39.0–52.0)
HEMOGLOBIN: 10.3 g/dL — AB (ref 13.0–17.0)
LYMPHS PCT: 6 %
Lymphs Abs: 1 10*3/uL (ref 0.7–4.0)
MCH: 32.2 pg (ref 26.0–34.0)
MCHC: 31.7 g/dL (ref 30.0–36.0)
MCV: 101.6 fL — AB (ref 78.0–100.0)
Monocytes Absolute: 0.6 10*3/uL (ref 0.1–1.0)
Monocytes Relative: 4 %
NEUTROS ABS: 14.3 10*3/uL — AB (ref 1.7–7.7)
Neutrophils Relative %: 90 %
Platelets: 113 10*3/uL — ABNORMAL LOW (ref 150–400)
RBC: 3.2 MIL/uL — ABNORMAL LOW (ref 4.22–5.81)
RDW: 22.2 % — ABNORMAL HIGH (ref 11.5–15.5)
WBC: 15.9 10*3/uL — ABNORMAL HIGH (ref 4.0–10.5)

## 2017-09-03 LAB — MRSA PCR SCREENING: MRSA by PCR: NEGATIVE

## 2017-09-03 LAB — INFLUENZA PANEL BY PCR (TYPE A & B)
INFLAPCR: NEGATIVE
Influenza B By PCR: NEGATIVE

## 2017-09-03 LAB — TROPONIN I
TROPONIN I: 0.06 ng/mL — AB (ref <0.03)
Troponin I: 0.12 ng/mL (ref <0.03)
Troponin I: 0.05 ng/mL (ref <0.03)

## 2017-09-03 MED ORDER — WARFARIN - PHARMACIST DOSING INPATIENT
Freq: Every day | Status: DC
  Administered 2017-09-04 – 2017-09-09 (×5)

## 2017-09-03 MED ORDER — COLCHICINE 0.6 MG PO TABS
0.3000 mg | ORAL_TABLET | Freq: Every day | ORAL | Status: DC
  Administered 2017-09-03 – 2017-09-10 (×8): 0.3 mg via ORAL
  Filled 2017-09-03 (×8): qty 1

## 2017-09-03 MED ORDER — INSULIN ASPART 100 UNIT/ML ~~LOC~~ SOLN
0.0000 [IU] | Freq: Three times a day (TID) | SUBCUTANEOUS | Status: DC
  Administered 2017-09-03: 2 [IU] via SUBCUTANEOUS
  Administered 2017-09-04: 1 [IU] via SUBCUTANEOUS
  Administered 2017-09-04: 2 [IU] via SUBCUTANEOUS
  Administered 2017-09-05: 1 [IU] via SUBCUTANEOUS
  Administered 2017-09-06 (×2): 2 [IU] via SUBCUTANEOUS
  Administered 2017-09-07: 1 [IU] via SUBCUTANEOUS
  Administered 2017-09-08: 3 [IU] via SUBCUTANEOUS
  Administered 2017-09-08: 2 [IU] via SUBCUTANEOUS
  Administered 2017-09-08: 1 [IU] via SUBCUTANEOUS
  Administered 2017-09-09 – 2017-09-10 (×2): 3 [IU] via SUBCUTANEOUS
  Administered 2017-09-10: 1 [IU] via SUBCUTANEOUS
  Administered 2017-09-10 – 2017-09-11 (×2): 2 [IU] via SUBCUTANEOUS
  Administered 2017-09-12: 1 [IU] via SUBCUTANEOUS
  Administered 2017-09-13: 2 [IU] via SUBCUTANEOUS
  Administered 2017-09-13: 1 [IU] via SUBCUTANEOUS
  Administered 2017-09-13: 2 [IU] via SUBCUTANEOUS
  Administered 2017-09-14: 1 [IU] via SUBCUTANEOUS
  Administered 2017-09-14: 2 [IU] via SUBCUTANEOUS
  Administered 2017-09-15 (×2): 1 [IU] via SUBCUTANEOUS

## 2017-09-03 MED ORDER — VANCOMYCIN HCL IN DEXTROSE 750-5 MG/150ML-% IV SOLN
750.0000 mg | INTRAVENOUS | Status: DC
  Administered 2017-09-05: 750 mg via INTRAVENOUS
  Filled 2017-09-03 (×2): qty 150

## 2017-09-03 MED ORDER — NITROGLYCERIN 0.4 MG SL SUBL: 0.4000 mg | SUBLINGUAL_TABLET | SUBLINGUAL | Status: DC | PRN

## 2017-09-03 MED ORDER — SUCROFERRIC OXYHYDROXIDE 500 MG PO CHEW
500.0000 mg | CHEWABLE_TABLET | Freq: Three times a day (TID) | ORAL | Status: DC
  Administered 2017-09-03 – 2017-09-10 (×13): 500 mg via ORAL
  Filled 2017-09-03 (×27): qty 1

## 2017-09-03 MED ORDER — COLLAGENASE 250 UNIT/GM EX OINT
TOPICAL_OINTMENT | Freq: Every day | CUTANEOUS | Status: DC
  Administered 2017-09-04 – 2017-09-10 (×7): via TOPICAL
  Administered 2017-09-12: 1 via TOPICAL
  Filled 2017-09-03 (×2): qty 30

## 2017-09-03 MED ORDER — SODIUM CHLORIDE 0.9% FLUSH: 3.0000 mL | INTRAVENOUS | Status: DC | PRN

## 2017-09-03 MED ORDER — POLYETHYLENE GLYCOL 3350 17 G PO PACK: 17.0000 g | PACK | Freq: Every day | ORAL | Status: DC | PRN

## 2017-09-03 MED ORDER — SODIUM CHLORIDE 0.9 % IV SOLN: 250.0000 mL | INTRAVENOUS | Status: DC | PRN

## 2017-09-03 MED ORDER — LORATADINE 10 MG PO TABS
10.0000 mg | ORAL_TABLET | Freq: Every day | ORAL | Status: DC
  Administered 2017-09-03 – 2017-09-15 (×12): 10 mg via ORAL
  Filled 2017-09-03 (×12): qty 1

## 2017-09-03 MED ORDER — INSULIN ASPART 100 UNIT/ML ~~LOC~~ SOLN: 0.0000 [IU] | Freq: Every day | SUBCUTANEOUS | Status: DC

## 2017-09-03 MED ORDER — ALBUTEROL SULFATE (2.5 MG/3ML) 0.083% IN NEBU
2.5000 mg | INHALATION_SOLUTION | Freq: Four times a day (QID) | RESPIRATORY_TRACT | Status: DC | PRN
  Administered 2017-09-12 – 2017-09-14 (×2): 2.5 mg via RESPIRATORY_TRACT
  Filled 2017-09-03 (×2): qty 3

## 2017-09-03 MED ORDER — HYDROCODONE-ACETAMINOPHEN 7.5-325 MG PO TABS
1.0000 | ORAL_TABLET | ORAL | Status: DC | PRN
  Administered 2017-09-03: 2 via ORAL
  Administered 2017-09-04 – 2017-09-13 (×7): 1 via ORAL
  Administered 2017-09-14 – 2017-09-15 (×2): 2 via ORAL
  Filled 2017-09-03 (×2): qty 2
  Filled 2017-09-03 (×7): qty 1
  Filled 2017-09-03: qty 2

## 2017-09-03 MED ORDER — CEFEPIME HCL 2 G IJ SOLR
2.0000 g | INTRAMUSCULAR | Status: DC
  Administered 2017-09-05 – 2017-09-09 (×3): 2 g via INTRAVENOUS
  Filled 2017-09-03 (×3): qty 2

## 2017-09-03 MED ORDER — SODIUM CHLORIDE 0.9% FLUSH
3.0000 mL | Freq: Two times a day (BID) | INTRAVENOUS | Status: DC
  Administered 2017-09-03 – 2017-09-15 (×22): 3 mL via INTRAVENOUS
  Administered 2017-09-15: 10 mL via INTRAVENOUS

## 2017-09-03 MED ORDER — DEXTROSE 5 % IV SOLN
2.0000 g | Freq: Once | INTRAVENOUS | Status: AC
  Administered 2017-09-03: 2 g via INTRAVENOUS
  Filled 2017-09-03: qty 2

## 2017-09-03 MED ORDER — RENA-VITE PO TABS
1.0000 | ORAL_TABLET | Freq: Every day | ORAL | Status: DC
  Administered 2017-09-03 – 2017-09-15 (×12): 1 via ORAL
  Filled 2017-09-03 (×12): qty 1

## 2017-09-03 MED ORDER — WARFARIN SODIUM 1 MG PO TABS
1.0000 mg | ORAL_TABLET | Freq: Once | ORAL | Status: AC
  Administered 2017-09-03: 1 mg via ORAL
  Filled 2017-09-03: qty 1

## 2017-09-03 MED ORDER — MIDODRINE HCL 5 MG PO TABS
10.0000 mg | ORAL_TABLET | Freq: Three times a day (TID) | ORAL | Status: DC
  Administered 2017-09-03 – 2017-09-15 (×36): 10 mg via ORAL
  Filled 2017-09-03 (×32): qty 2

## 2017-09-03 MED ORDER — NEPRO/CARBSTEADY PO LIQD
237.0000 mL | Freq: Two times a day (BID) | ORAL | Status: DC
  Administered 2017-09-03 – 2017-09-15 (×15): 237 mL via ORAL
  Filled 2017-09-03 (×27): qty 237

## 2017-09-03 MED ORDER — PANTOPRAZOLE SODIUM 40 MG PO TBEC
40.0000 mg | DELAYED_RELEASE_TABLET | Freq: Every day | ORAL | Status: DC
  Administered 2017-09-03 – 2017-09-15 (×12): 40 mg via ORAL
  Filled 2017-09-03 (×12): qty 1

## 2017-09-03 MED ORDER — VANCOMYCIN HCL 10 G IV SOLR
1500.0000 mg | Freq: Once | INTRAVENOUS | Status: AC
  Administered 2017-09-03: 1500 mg via INTRAVENOUS
  Filled 2017-09-03: qty 1500

## 2017-09-03 NOTE — Progress Notes (Signed)
ANTICOAGULATION CONSULT NOTE - Initial Consult  Pharmacy Consult for warfarin Indication: atrial fibrillation  No Known Allergies  Patient Measurements: Height: 5\' 10"  (177.8 cm) Weight: 172 lb (78 kg) IBW/kg (Calculated) : 73  Vital Signs: Temp: 98.1 F (36.7 C) (09/29 0839) Temp Source: Oral (09/29 0839) BP: 100/58 (09/29 0839) Pulse Rate: 73 (09/29 0839)  Labs:  Recent Labs  09/02/17 09/03/17 0323 09/03/17 0856  HGB  --  10.3*  --   HCT  --  32.5*  --   PLT  --  113*  --   LABPROT  --  25.7*  --   INR 1.8 2.37  --   CREATININE  --  2.76*  --   TROPONINI  --   --  0.12*    Estimated Creatinine Clearance: 28.7 mL/min (A) (by C-G formula based on SCr of 2.76 mg/dL (H)).   Assessment: 80 YOM with AFib on warfarin. Had INR checked on 9/28 which resulted at 1.8 and dose was changed to 1mg  daily except 2mg  on Fridays (previously 1mg  every day). Took warfarin 2mg  on 9/28. Admitted overnight and INR at ~0330 this morning was 2.37.  Hgb 10.3, plts 113- no bleeding noted.  Goal of Therapy:  INR 2-3 Monitor platelets by anticoagulation protocol: Yes   Plan:  Warfarin 1mg  po x1 tonight Daily INR Follow for s/s bleeding  Mery Guadalupe D. Ajiah Mcglinn, PharmD, BCPS Clinical Pharmacist Pager: [phone] Clinical Phone for 09/03/2017 until 3:30pm: [phone] If after 3:30pm, please call main pharmacy at [phone] 09/03/2017 12:15 PM

## 2017-09-03 NOTE — ED Notes (Signed)
Pt. To XRAY via stretcher. 

## 2017-09-03 NOTE — Progress Notes (Signed)
PT Cancellation Note  Patient Details Name: [name] MRN: [id number] DOB: [birth year]   Cancelled Treatment:    Reason Eval/Treat Not Completed: Medical issues which prohibited therapy RN requesting to hold PT, as pt with slightly elevated troponin. Will follow up as schedule allows.   [name], PT, DPT  Acute Rehabilitation Services  Pager: [phone]    Rudean Hitt 09/03/2017, 11:30 AM

## 2017-09-03 NOTE — Consult Note (Signed)
[facility] Renal Consultation Note    Indication for Consultation:  Management of ESRD/hemodialysis; anemia, hypertension/volume and secondary hyperparathyroidism  HPI: [name] is a 61 y.o. male with ESRD, HD initiated 07/2017 during recent [facility] admit with MSSA bacteremia/shock. PMH includes  HTN, Type 2 DM, Hx Rectal cancer (s/p colostomy 2008), Afib (on warfarin), CAD (s/p stents), ischemic CM (EF 15-20%, ICD in place), OSA on CPAP, cirrhosis, thrombocytopenia.   Presented to ED last night with productive cough and 2 reported episodes of hemoptysis. CXR showing hazy densities in R Lung suspicious for PNA. Blood and sputum cultures collected. Empiric antibiotics started. Patient admitted for further treatment of PNA. Labs showing Na 134 K 4.3 Ca 8.7 WBC 15.9 Hgb 10.3.   Seen in room. Appears comfortable on room air. Says had been in usual state of health until last evening when coughing started and did not want to "take a chance" on becoming more ill at home. No coughing or episodes of hemoptysis today. Denies fever, chills, CP, SOB, nausea, vomiting, abdominal pain.   Dialyzes at [facility] MWF.  Has been at center about 2 weeks. Tolerating HD well, but says may transfer to [facility] as is closer to his home. Patient looks well, has not had further coughing    Past Medical History:  Diagnosis Date  . Adenocarcinoma of rectum ([place])    a. 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 AICD (automatic cardioverter/defibrillator) present 2002   BI V ICD  . Anemia   . CAD (coronary artery disease)    a. BMS to LAD 2001 at [facility] b. PTCA/atherectomy ramus and BMS to LAD 2009  . CHF (congestive heart failure) ([place])   . Cholelithiasis 06/2015  . Chronic kidney disease, stage IV (severe) ([place])   . Chronic systolic heart failure ([place])   . Cirrhosis ([place])   . Colostomy in place [facility])   . Dizziness    a. chronic. Admission for this 07/18/2014  . DM type 2, uncontrolled, with renal complications  ([place])   . Dysrhythmia   . Essential hypertension, benign   . GERD (gastroesophageal reflux disease)   . HCAP (healthcare-associated pneumonia) 07/21/2015  . Hematuria   . History of blood transfusion    "I've had 2 units so far this year" (09/27/2015)  . HLD (hyperlipidemia)   . Ischemic cardiomyopathy    EF 18% by nuclear study 2016, multiple myocardial infarctions in past    . Myocardial infarction ([place]) 2001  . Obesity   . Orthostatic hypotension   . OSA on CPAP   . Paroxysmal atrial fibrillation ([facility])    a. on amiodarone, digoxin and Eliquis  . PONV (postoperative nausea and vomiting)   . Prostate cancer ([place])    a. s/p seed implants with chemo and radiation  . SAH (subarachnoid hemorrhage) ([place])    post-traumatic (fall) [facility] 12/2014  . TIA (transient ischemic attack)    Past Surgical History:  Procedure Laterality Date  . Abdominal and Perineal Resection of Rectum with Total Mesorectal Excision  10/04/2007  . AV FISTULA PLACEMENT Right 09/16/2015   Procedure: ARTERIOVENOUS (AV) FISTULA CREATION - BRACHIOCEPHALIC;  Surgeon: Elam Dutch, MD;  Location: [place];  Service: Vascular;  Laterality: Right;  . BI-VENTRICULAR IMPLANTABLE CARDIOVERTER DEFIBRILLATOR  (CRT-D)  2009  . CARDIAC CATHETERIZATION  08/2001; 2009   ; [name] 07/10/2013  . CARDIAC DEFIBRILLATOR PLACEMENT  2002  . CARDIAC DEFIBRILLATOR PLACEMENT  2009   Upgraded to a BiV ICD  . COLONOSCOPY  09/14/2011  Dr. Gala Romney: via colostomy, Single pedunculated benign inflammatory polyp. Due for surveillance Oct 2015  . COLONOSCOPY N/A 07/02/2014   Procedure: COLONOSCOPY;  Surgeon: Daneil Dolin, MD;  [facility];  Service: Endoscopy;  Laterality: N/A;  7:30 / COLONOSCOPY THRU COLOSTOMY  . COLONOSCOPY N/A 12/11/2014   Dr. Gala Romney via colostomy. Normal. Repeat in 2021.   Marland Kitchen COLONOSCOPY N/A 08/24/2015   Dr. Havery Moros: diminutive polyp not removed, stoma site of bleeding  . COLOSTOMY  2008  . CORONARY ANGIOPLASTY WITH  STENT PLACEMENT  2001; ~ 2006   "1 + 1"   . ELECTROPHYSIOLOGIC STUDY N/A 08/28/2015   Procedure: AV Node Ablation;  Surgeon: Will Meredith Leeds, MD;  Location: [place] CV LAB;  Service: Cardiovascular;  Laterality: N/A;  . EP IMPLANTABLE DEVICE N/A 04/10/2015   Procedure: Ppm/Biv Ppm Generator Changeout;  Surgeon: Evans Lance, MD;  Location: [place] INVASIVE CV LAB CUPID;  Service: Cardiovascular;  Laterality: N/A;  . ERCP N/A 06/11/2016   Procedure: ENDOSCOPIC RETROGRADE CHOLANGIOPANCREATOGRAPHY (ERCP);  Surgeon: Teena Irani, MD;  Location: [facility] ENDOSCOPY;  Service: Endoscopy;  Laterality: N/A;  . ERCP N/A 01/26/2017   Procedure: ENDOSCOPIC RETROGRADE CHOLANGIOPANCREATOGRAPHY (ERCP);  Surgeon: Teena Irani, MD;  Location: [facility] ENDOSCOPY;  Service: Endoscopy;  Laterality: N/A;  . ERCP  06/2016   Dr. Amedeo Plenty: duodenal fistula, dilated bile duct but no obvious stones, s/p sphincterotomy and stent placement  . ERCP  01/2017   Dr. Amedeo Plenty: CBD stones, s/p sphincterotomy, balloon extraction, stent removal  . ESOPHAGOGASTRODUODENOSCOPY N/A 07/02/2014   Procedure: ESOPHAGOGASTRODUODENOSCOPY (EGD);  Surgeon: Daneil Dolin, MD;  [facility];  Service: Endoscopy;  Laterality: N/A;  7:30  . ESOPHAGOGASTRODUODENOSCOPY N/A 12/11/2014   [id number] EGD  . ESOPHAGOGASTRODUODENOSCOPY (EGD) WITH PROPOFOL N/A 04/18/2017   Procedure: ESOPHAGOGASTRODUODENOSCOPY (EGD) WITH PROPOFOL;  Surgeon: Daneil Dolin, MD;  [facility];  Service: Endoscopy;  Laterality: N/A;  2:30pm  . GASTROINTESTINAL STENT REMOVAL N/A 01/26/2017   Procedure: GASTROINTESTINAL STENT REMOVAL;  Surgeon: Teena Irani, MD;  Location: [place];  Service: Endoscopy;  Laterality: N/A;  . GIVENS CAPSULE STUDY N/A 07/23/2015   Dr. Michail Sermon: small non-bleeding AVM, mild gastritis  . IR FLUORO GUIDE CV LINE RIGHT  07/27/2017  . IR REMOVAL TUN CV CATH W/O FL  08/12/2017  . IR US GUIDE VASC ACCESS RIGHT  07/27/2017  . LEFT HEART CATHETERIZATION  WITH CORONARY ANGIOGRAM N/A 07/13/2013   Procedure: LEFT HEART CATHETERIZATION WITH CORONARY ANGIOGRAM;  Surgeon: Lorretta Harp, MD;  Location: [facility] CATH LAB;  Service: Cardiovascular;  Laterality: N/A;  . Venia Minks DILATION N/A 07/02/2014   Procedure: Venia Minks DILATION;  Surgeon: Daneil Dolin, MD;  [facility];  Service: Endoscopy;  Laterality: N/A;  7:30  . MALONEY DILATION N/A 04/18/2017   Procedure: Venia Minks DILATION;  Surgeon: Daneil Dolin, MD;  [facility];  Service: Endoscopy;  Laterality: N/A;  . PORTACATH PLACEMENT  06/2007   "removed ~ 1 yr later"  . RIGHT HEART CATHETERIZATION N/A 02/24/2015   Procedure: RIGHT HEART CATH;  Surgeon: Jolaine Artist, MD;  Location[facility] CATH LAB;  Service: Cardiovascular;  Laterality: N/A;  . SAVORY DILATION N/A 07/02/2014   Procedure: SAVORY DILATION;  Surgeon: Daneil Dolin, MD;  [facility];  Service: Endoscopy;  Laterality: N/A;  7:30   Family History  Problem Relation Age of Onset  . Colon cancer Mother 28  . Coronary artery disease Father   . Colon  cancer Sister 87  . Diabetes Sister   . Colon cancer Other        2 cousins, succumbed to illness   Social History:  reports that he has never smoked. He has never used smokeless tobacco. He reports that he does not drink alcohol or use drugs. No Known Allergies Prior to Admission medications   Medication Sig Start Date End Date Taking? Authorizing Provider  cephALEXin (KEFLEX) 250 MG capsule Take 1 capsule (250 mg total) by mouth 2 (two) times daily. 08/25/17 09/04/17 Yes Michel Bickers, MD  colchicine 0.6 MG tablet Take 0.5 tablets (0.3 mg total) by mouth daily. 08/19/17  Yes Angiulli, Lavon Paganini, PA-C  collagenase (SANTYL) ointment Apply topically daily. Applied to affected areas directed 08/19/17  Yes Angiulli, Lavon Paganini, PA-C  glucose blood test strip Use to test blood sugar 3 times daily 08/19/17  Yes Angiulli, Lavon Paganini, PA-C  HYDROcodone-acetaminophen (NORCO)  7.5-325 MG tablet Take 1-2 tablets by mouth every 4 (four) hours as needed for moderate pain. 08/19/17  Yes Angiulli, Lavon Paganini, PA-C  ketorolac (TORADOL) 10 MG tablet TAKE 1 TABLET BY MOUTH 4 TIMES A DAY AS NEEDED FOR PAIN 07/07/17  Yes [provider]  loratadine (CLARITIN) 10 MG tablet Take 1 tablet (10 mg total) by mouth daily. 08/19/17  Yes Angiulli, Lavon Paganini, PA-C  midodrine (PROAMATINE) 10 MG tablet Take 1 tablet (10 mg total) by mouth 3 (three) times daily with meals. 08/19/17  Yes Angiulli, Lavon Paganini, PA-C  multivitamin (RENA-VIT) TABS tablet Take 1 tablet by mouth at bedtime. 08/19/17  Yes Angiulli, Lavon Paganini, PA-C  nitroGLYCERIN (NITROSTAT) 0.4 MG SL tablet Place 1 tablet (0.4 mg total) under the tongue every 5 (five) minutes as needed for chest pain. 08/19/17 11/17/17 Yes Angiulli, Lavon Paganini, PA-C  pantoprazole (PROTONIX) 40 MG tablet Take 1 tablet (40 mg total) by mouth daily. 08/19/17  Yes Angiulli, Lavon Paganini, PA-C  polyethylene glycol (MIRALAX / GLYCOLAX) packet Take 17 g by mouth daily as needed for mild constipation. 04/28/17  Yes Lavina Hamman, MD  PROAIR RESPICLICK 244 (90 BASE) MCG/ACT AEPB Inhale 1 puff into the lungs every 6 (six) hours as needed (for breathing).  08/15/15  Yes [provider]  sucroferric oxyhydroxide (VELPHORO) 500 MG chewable tablet Chew 1 tablet (500 mg total) by mouth 3 (three) times daily with meals. 08/19/17  Yes Angiulli, Lavon Paganini, PA-C  warfarin (COUMADIN) 1 MG tablet Take 1 tablet (1 mg total) by mouth daily at 6 PM. Patient taking differently: Take 1-2 mg by mouth daily at 6 PM. Takes 1mg  daily except 2mg  on Fridays as of 09/02/17 office visit 08/19/17  Yes Angiulli, Lavon Paganini, PA-C  insulin aspart (NOVOLOG) 100 UNIT/ML FlexPen Inject 3 Units into the skin 3 (three) times daily with meals. Patient not taking: Reported on 09/03/2017 08/19/17   Angiulli, Lavon Paganini, PA-C  Insulin Glargine (LANTUS) 100 UNIT/ML Solostar Pen Inject 8 Units into the skin daily at  10 pm. Patient not taking: Reported on 09/03/2017 08/19/17   Angiulli, Lavon Paganini, PA-C   Current Facility-Administered Medications  Medication Dose Route Frequency Provider Last Rate Last Dose  . 0.9 %  sodium chloride infusion  250 mL Intravenous PRN Jani Gravel, MD      . albuterol (PROVENTIL) (2.5 MG/3ML) 0.083% nebulizer solution 2.5 mg  2.5 mg Inhalation Q6H PRN Jani Gravel, MD      . ceFEPIme (MAXIPIME) 2 g in dextrose 5 % 50 mL IVPB  2  g Intravenous Q M,W,F-2000 Bajbus, Lauren D, RPH      . ceFEPIme (MAXIPIME) 2 g in dextrose 5 % 50 mL IVPB  2 g Intravenous Once Bajbus, Lauren D, RPH      . colchicine tablet 0.3 mg  0.3 mg Oral Daily Jani Gravel, MD   0.3 mg at 09/03/17 1035  . collagenase (SANTYL) ointment   Topical Daily Jani Gravel, MD      . HYDROcodone-acetaminophen [facility]) 7.5-325 MG per tablet 1-2 tablet  1-2 tablet Oral Q4H PRN Jani Gravel, MD      . insulin aspart (novoLOG) injection 0-5 Units  0-5 Units Subcutaneous QHS Jani Gravel, MD      . insulin aspart (novoLOG) injection 0-9 Units  0-9 Units Subcutaneous TID WC Jani Gravel, MD   2 Units at 09/03/17 1249  . loratadine (CLARITIN) tablet 10 mg  10 mg Oral Daily Jani Gravel, MD   10 mg at 09/03/17 1034  . midodrine (PROAMATINE) tablet 10 mg  10 mg Oral TID WC Jani Gravel, MD   10 mg at 09/03/17 1034  . multivitamin (RENA-VIT) tablet 1 tablet  1 tablet Oral QHS Jani Gravel, MD      . nitroGLYCERIN (NITROSTAT) SL tablet 0.4 mg  0.4 mg Sublingual Q5 min PRN Jani Gravel, MD      . pantoprazole (PROTONIX) EC tablet 40 mg  40 mg Oral Daily Jani Gravel, MD   40 mg at 09/03/17 1034  . polyethylene glycol (MIRALAX / GLYCOLAX) packet 17 g  17 g Oral Daily PRN Jani Gravel, MD      . sodium chloride flush (NS) 0.9 % injection 3 mL  3 mL Intravenous Q12H Jani Gravel, MD   3 mL at 09/03/17 1035  . sodium chloride flush (NS) 0.9 % injection 3 mL  3 mL Intravenous PRN Jani Gravel, MD      . sucroferric oxyhydroxide [facility]) chewable tablet 500 mg  500 mg  Oral TID WC Jani Gravel, MD      . Derrill Memo ON 09/05/2017] vancomycin (VANCOCIN) IVPB 750 mg/150 ml premix  750 mg Intravenous Q M,W,F-HD Ledford, James L, RPH      . warfarin (COUMADIN) tablet 1 mg  1 mg Oral ONCE-1800 Bajbus, Lauren D, RPH      . Warfarin - Pharmacist Dosing Inpatient   Does not apply q1800 Bajbus, Lauren D, RPH        ROS: As per HPI otherwise negative.  Physical Exam: Vitals:   09/03/17 0530 09/03/17 0600 09/03/17 0715 09/03/17 0839  BP: (!) 91/57 103/63 (!) 99/54 (!) 100/58  Pulse: 74 71 73 73  Resp: (!) 22 (!) 28 (!) 28 (!) 24  Temp:    98.1 F (36.7 C)  TempSrc:    Oral  SpO2: 91% 96% 92% 93%  Weight:      Height:         General: Elderly WM, debilitated, NAD  Head: NCAT sclera not icteric MMM Neck: Supple. No JVD No masses Lungs: Breathing is unlabored. Lung sounds diminished, scattered rhonchi  Heart: RRR with S1 S2 Abdomen: soft NT + BS; colostomy present, empty  Lower extremities: no LE edema or open wounds  Neuro: A & O  X 3. Moves all extremities spontaneously. Psych:  Responds to questions appropriately with a normal affect. Dialysis Access: RUE AVF +bruit,   Labs: Basic Metabolic Panel:  Recent Labs Lab 09/03/17 0323  NA 134*  K 4.3  CL 96*  CO2 25  GLUCOSE 147*  BUN 16  CREATININE 2.76*  CALCIUM 8.7*   Liver Function Tests:  Recent Labs Lab 09/03/17 0323  AST 53*  ALT 14*  ALKPHOS 116  BILITOT 3.2*  PROT 6.7  ALBUMIN 2.3*   No results for input(s): LIPASE, AMYLASE in the last 168 hours. No results for input(s): AMMONIA in the last 168 hours. CBC:  Recent Labs Lab 09/03/17 0323  WBC 15.9*  NEUTROABS 14.3*  HGB 10.3*  HCT 32.5*  MCV 101.6*  PLT 113*   Cardiac Enzymes:  Recent Labs Lab 09/03/17 0856 09/03/17 1240  TROPONINI 0.12* 0.05*   CBG:  Recent Labs Lab 09/03/17 1039 09/03/17 1155  GLUCAP 176* 178*   Iron Studies: No results for input(s): IRON, TIBC, TRANSFERRIN, FERRITIN in the last 72  hours. Studies/Results: Dg Chest 2 View  Result Date: 09/03/2017 CLINICAL DATA:  Hemoptysis today EXAM: CHEST  2 VIEW COMPARISON:  07/24/2017 FINDINGS: Stable cardiomegaly with ICD device projecting over the left hemithorax and leads in the right atrium, coronary sinus and right ventricle. Hazy opacity at the right lung base is noted suspicious for pneumonia and/or layering right effusion. Degenerate changes are seen about both glenohumeral and AC joints. IMPRESSION: 1. Hazy densities at the right lung base suspicious for pneumonia and/or layering effusion. 2. Stable cardiomegaly with ICD device in place. Electronically Signed   By: Pearse Royalty M.D.   On: 09/03/2017 03:26    Dialysis Orders:  [place] MWF  4h 180F BFR 400/800 3K/2.5Ca EDW 77kg  L AVF No heparin Mircera 50 mcg IV q 2 weeks (to start 10/3)  Venofer 50 mg IV q week (last 9/24)    Assessment/Plan: 1. Hemoptysis/HCAP - CXR with densities in R Lung base; blood cultures pending, CT chest pending, IV Vanc/Cefepime started- looks well at present  2.  ESRD -  MWF Continue on schedule. No urgent HD needs, plan next HD Monday. Noted that patient wants to change centers to [facility]  3.  Hypertension/volume  -  BP soft on midodrine tid; Volume stable  4.  Anemia  - Hgb 10.3 Follow trend 5.  Metabolic bone disease -  Ca ok/ No VDRA yet, No binders on outpatient med list- OP Phos ok; Will check with renal panel here  6.  Nutrition - Renal diet/vitamins/Nepro for low albumin  7. CHF - EF 15-20% BiV ICD in place  8. Afib - on warfarin   [name] PA-C [place] Pager [phone] 09/03/2017, 1:41 PM   Patient seen and examined, agree with above note with above modifications. 61 year old WM ESRD- presenting with pulmonary sxms suggestive of pneumonia.  Placed on antibiotics.  Received full HD treatment yesterday.  No indications for HD today, plan on next treatment on Monday  Corliss Parish,  MD 09/03/2017

## 2017-09-03 NOTE — Progress Notes (Signed)
Pt admitted to room 2W 32 from Ed.  Pt has stage 2-3 on sacrum with wet to dry dressing.  Pt states home health comes out 3 times per week and changes dressing and applies santyl into wound.  Pt also has PT at home 3 times per week.  Paged Dr. Karleen Hampshire to advise needs wound consult and PT order.  Pt alert and responsive.  VSS.  Wife at bedside.

## 2017-09-03 NOTE — Progress Notes (Signed)
[name]  is a 61 y.o. male, w Dm2, SAH, Pafib, Mild MR, Mild AR,  CAD, CHF (EF 20%) ESRD on HD (M,W, F), apparently c/o cough  with yellow sputum and mild hemoptysis. She was found to have health care associated pneumonia. Admitted for the management of hcap.    Plan:  1. IV antibiotics.  2. PT evaluation.  3.wound care consult for decubitus ulcer.   Hosie Poisson, MD [phone]

## 2017-09-03 NOTE — Progress Notes (Signed)
Tele #[id number] Education officer, community.  Dr. Karleen Hampshire in room.

## 2017-09-03 NOTE — H&P (Addendum)
TRH H&P   Patient Demographics:    [name], is a 61 y.o. male  MRN: [id number]   DOB - [birth year]  Admit Date - 09/03/2017  Outpatient Primary MD for the patient is Redmond School, MD  Referring MD/NP/PA:  Joseph Berkshire  Outpatient Specialists:  B[place]on (cardiology) Erling Cruz (nephrology)  Patient coming from: HOME  No chief complaint on file.  HEMOPTYSIS   HPI:    [name]  is a 61 y.o. male, w Dm2, SAH, Pafib, Mild MR, Mild AR,  CAD, CHF (EF 20%) ESRD on HD (M,W, F), apparently c/o cough last nite with yellow sputum and mild hemoptysis (nickle sized).  Denies fever, chills, cp, palp, sob, n/v, diarrhea. Pt presented to ED for evaluation  In ED, INR 2.37    CXR => IMPRESSION: 1. Hazy densities at the right lung base suspicious for pneumonia and/or layering effusion. 2. Stable cardiomegaly with ICD device in place.  Pt will be admitted for pneumonia , hcap     Review of systems:    In addition to the HPI above,   No Headache, No changes with Vision or hearing, No problems swallowing food or Liquids, No Chest pain Shortness of Breath, No Abdominal pain, No Nausea or Vommitting, Bowel movements are regular, No Blood in stool or Urine, No dysuria, No new skin rashes or bruises, No new joints pains-aches,  No new weakness, tingling, numbness in any extremity, No recent weight gain or loss, No polyuria, polydypsia or polyphagia, No significant Mental Stressors.  A full 10 point Review of Systems was done, except as stated above, all other Review of Systems were negative.   With Past History of the following :    Past Medical History:  Diagnosis Date  . Adenocarcinoma of rectum ([place])    a. 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 AICD (automatic cardioverter/defibrillator) present 2002   BI V ICD  . Anemia   . CAD (coronary artery disease)    a. BMS to  LAD 2001 at [facility] b. PTCA/atherectomy ramus and BMS to LAD 2009  . CHF (congestive heart failure) ([place])   . Cholelithiasis 06/2015  . Chronic kidney disease, stage IV (severe) ([place])   . Chronic systolic heart failure ([place])   . Cirrhosis ([employer])   . Colostomy in place [facility])   . Dizziness    a. chronic. Admission for this 07/18/2014  . DM type 2, uncontrolled, with renal complications ([place])   . Dysrhythmia   . Essential hypertension, benign   . GERD (gastroesophageal reflux disease)   . HCAP (healthcare-associated pneumonia) 07/21/2015  . Hematuria   . History of blood transfusion    "I've had 2 units so far this year" (09/27/2015)  . HLD (hyperlipidemia)   . Ischemic cardiomyopathy    EF 18% by nuclear study 2016, multiple myocardial infarctions in past    . Myocardial infarction ([place]) 2001  .  Obesity   . Orthostatic hypotension   . OSA on CPAP   . Paroxysmal atrial fibrillation (HCC)    a. on amiodarone, digoxin and Eliquis  . PONV (postoperative nausea and vomiting)   . Prostate cancer ([place])    a. s/p seed implants with chemo and radiation  . SAH (subarachnoid hemorrhage) ([place])    post-traumatic (fall) [facility] 12/2014  . TIA (transient ischemic attack)       Past Surgical History:  Procedure Laterality Date  . Abdominal and Perineal Resection of Rectum with Total Mesorectal Excision  10/04/2007  . AV FISTULA PLACEMENT Right 09/16/2015   Procedure: ARTERIOVENOUS (AV) FISTULA CREATION - BRACHIOCEPHALIC;  Surgeon: Elam Dutch, MD;  Location: [facility];  Service: Vascular;  Laterality: Right;  . BI-VENTRICULAR IMPLANTABLE CARDIOVERTER DEFIBRILLATOR  (CRT-D)  2009  . CARDIAC CATHETERIZATION  08/2001; 2009   ; [name] 07/10/2013  . CARDIAC DEFIBRILLATOR PLACEMENT  2002  . CARDIAC DEFIBRILLATOR PLACEMENT  2009   Upgraded to a BiV ICD  . COLONOSCOPY  09/14/2011   Dr. Gala Romney: via colostomy, Single pedunculated benign inflammatory polyp. Due for surveillance Oct 2015  . COLONOSCOPY N/A  07/02/2014   Procedure: COLONOSCOPY;  Surgeon: Daneil Dolin, MD;  [facility];  Service: Endoscopy;  Laterality: N/A;  7:30 / COLONOSCOPY THRU COLOSTOMY  . COLONOSCOPY N/A 12/11/2014   Dr. Gala Romney via colostomy. Normal. Repeat in 2021.   Marland Kitchen COLONOSCOPY N/A 08/24/2015   Dr. Havery Moros: diminutive polyp not removed, stoma site of bleeding  . COLOSTOMY  2008  . CORONARY ANGIOPLASTY WITH STENT PLACEMENT  2001; ~ 2006   "1 + 1"   . ELECTROPHYSIOLOGIC STUDY N/A 08/28/2015   Procedure: AV Node Ablation;  Surgeon: Will Meredith Leeds, MD;  Location: [place] CV LAB;  Service: Cardiovascular;  Laterality: N/A;  . EP IMPLANTABLE DEVICE N/A 04/10/2015   Procedure: Ppm/Biv Ppm Generator Changeout;  Surgeon: Evans Lance, MD;  Location: [place] INVASIVE CV LAB CUPID;  Service: Cardiovascular;  Laterality: N/A;  . ERCP N/A 06/11/2016   Procedure: ENDOSCOPIC RETROGRADE CHOLANGIOPANCREATOGRAPHY (ERCP);  Surgeon: Teena Irani, MD;  Location: [facility] ENDOSCOPY;  Service: Endoscopy;  Laterality: N/A;  . ERCP N/A 01/26/2017   Procedure: ENDOSCOPIC RETROGRADE CHOLANGIOPANCREATOGRAPHY (ERCP);  Surgeon: Teena Irani, MD;  Location: [facility]COPY;  Service: Endoscopy;  Laterality: N/A;  . ERCP  06/2016   Dr. Amedeo Plenty: duodenal fistula, dilated bile duct but no obvious stones, s/p sphincterotomy and stent placement  . ERCP  01/2017   Dr. Amedeo Plenty: CBD stones, s/p sphincterotomy, balloon extraction, stent removal  . ESOPHAGOGASTRODUODENOSCOPY N/A 07/02/2014   Procedure: ESOPHAGOGASTRODUODENOSCOPY (EGD);  Surgeon: Daneil Dolin, MD;  [facility];  Service: Endoscopy;  Laterality: N/A;  7:30  . ESOPHAGOGASTRODUODENOSCOPY N/A 12/11/2014   YSA:[id number] EGD  . ESOPHAGOGASTRODUODENOSCOPY (EGD) WITH PROPOFOL N/A 04/18/2017   Procedure: ESOPHAGOGASTRODUODENOSCOPY (EGD) WITH PROPOFOL;  Surgeon: Daneil Dolin, MD;  [facility];  Service: Endoscopy;  Laterality: N/A;  2:30pm  . GASTROINTESTINAL STENT REMOVAL N/A  01/26/2017   Procedure: GASTROINTESTINAL STENT REMOVAL;  Surgeon: Teena Irani, MD;  Location: [place];  Service: Endoscopy;  Laterality: N/A;  . GIVENS CAPSULE STUDY N/A 07/23/2015   Dr. Michail Sermon: small non-bleeding AVM, mild gastritis  . IR FLUORO GUIDE CV LINE RIGHT  07/27/2017  . IR REMOVAL TUN CV CATH W/O FL  08/12/2017  . IR US GUIDE VASC ACCESS RIGHT  07/27/2017  . LEFT HEART CATHETERIZATION WITH CORONARY ANGIOGRAM N/A 07/13/2013   Procedure:  LEFT HEART CATHETERIZATION WITH CORONARY ANGIOGRAM;  Surgeon: Lorretta Harp, MD;  Location: [facility] CATH LAB;  Service: Cardiovascular;  Laterality: N/A;  . Venia Minks DILATION N/A 07/02/2014   Procedure: Venia Minks DILATION;  Surgeon: Daneil Dolin, MD;  [facility];  Service: Endoscopy;  Laterality: N/A;  7:30  . MALONEY DILATION N/A 04/18/2017   Procedure: Venia Minks DILATION;  Surgeon: Daneil Dolin, MD;  [facility];  Service: Endoscopy;  Laterality: N/A;  . PORTACATH PLACEMENT  06/2007   "removed ~ 1 yr later"  . RIGHT HEART CATHETERIZATION N/A 02/24/2015   Procedure: RIGHT HEART CATH;  Surgeon: Jolaine Artist, MD;  Location: [facility] CATH LAB;  Service: Cardiovascular;  Laterality: N/A;  . SAVORY DILATION N/A 07/02/2014   Procedure: SAVORY DILATION;  Surgeon: Daneil Dolin, MD;  [facility];  Service: Endoscopy;  Laterality: N/A;  7:30      Social History:     Social History  Substance Use Topics  . Smoking status: Never Smoker  . Smokeless tobacco: Never Used  . Alcohol use No     Comment: Former user 45 years ago     Lives -  At home  Mobility - unable to walk     Family History :     Family History  Problem Relation Age of Onset  . Colon cancer Mother 76  . Coronary artery disease Father   . Colon cancer Sister 48  . Diabetes Sister   . Colon cancer Other        2 cousins, succumbed to illness      Home Medications:   Prior to Admission medications   Medication Sig Start Date End Date  Taking? Authorizing Provider  cephALEXin (KEFLEX) 250 MG capsule Take 1 capsule (250 mg total) by mouth 2 (two) times daily. 08/25/17 09/04/17 Yes Michel Bickers, MD  colchicine 0.6 MG tablet Take 0.5 tablets (0.3 mg total) by mouth daily. 08/19/17  Yes Angiulli, Lavon Paganini, PA-C  collagenase (SANTYL) ointment Apply topically daily. Applied to affected areas directed 08/19/17  Yes Angiulli, Lavon Paganini, PA-C  glucose blood test strip Use to test blood sugar 3 times daily 08/19/17  Yes Angiulli, Lavon Paganini, PA-C  HYDROcodone-acetaminophen (NORCO) 7.5-325 MG tablet Take 1-2 tablets by mouth every 4 (four) hours as needed for moderate pain. 08/19/17  Yes Angiulli, Lavon Paganini, PA-C  ketorolac (TORADOL) 10 MG tablet TAKE 1 TABLET BY MOUTH 4 TIMES A DAY AS NEEDED FOR PAIN 07/07/17  Yes [provider]  loratadine (CLARITIN) 10 MG tablet Take 1 tablet (10 mg total) by mouth daily. 08/19/17  Yes Angiulli, Lavon Paganini, PA-C  midodrine (PROAMATINE) 10 MG tablet Take 1 tablet (10 mg total) by mouth 3 (three) times daily with meals. 08/19/17  Yes Angiulli, Lavon Paganini, PA-C  multivitamin (RENA-VIT) TABS tablet Take 1 tablet by mouth at bedtime. 08/19/17  Yes Angiulli, Lavon Paganini, PA-C  nitroGLYCERIN (NITROSTAT) 0.4 MG SL tablet Place 1 tablet (0.4 mg total) under the tongue every 5 (five) minutes as needed for chest pain. 08/19/17 11/17/17 Yes Angiulli, Lavon Paganini, PA-C  pantoprazole (PROTONIX) 40 MG tablet Take 1 tablet (40 mg total) by mouth daily. 08/19/17  Yes Angiulli, Lavon Paganini, PA-C  polyethylene glycol (MIRALAX / GLYCOLAX) packet Take 17 g by mouth daily as needed for mild constipation. 04/28/17  Yes Lavina Hamman, MD  PROAIR RESPICLICK 024 (90 BASE) MCG/ACT AEPB Inhale 1 puff into the lungs every 6 (six) hours as  needed (for breathing).  08/15/15  Yes [provider]  sucroferric oxyhydroxide (VELPHORO) 500 MG chewable tablet Chew 1 tablet (500 mg total) by mouth 3 (three) times daily with meals. 08/19/17  Yes Angiulli,  Lavon Paganini, PA-C  warfarin (COUMADIN) 1 MG tablet Take 1 tablet (1 mg total) by mouth daily at 6 PM. 08/19/17  Yes Angiulli, Lavon Paganini, PA-C  insulin aspart (NOVOLOG) 100 UNIT/ML FlexPen Inject 3 Units into the skin 3 (three) times daily with meals. Patient not taking: Reported on 09/03/2017 08/19/17   Angiulli, Lavon Paganini, PA-C  Insulin Glargine (LANTUS) 100 UNIT/ML Solostar Pen Inject 8 Units into the skin daily at 10 pm. Patient not taking: Reported on 09/03/2017 08/19/17   Angiulli, Lavon Paganini, PA-C     Allergies:    No Known Allergies   Physical Exam:   Vitals  Blood pressure 103/63, pulse 71, temperature 98.3 F (36.8 C), temperature source Oral, resp. rate (!) 28, height 5\' 10"  (1.778 m), weight 78 kg (172 lb), SpO2 96 %.   1. General lying in bed in NAD,    2. Normal affect and insight, Not Suicidal or Homicidal, Awake Alert, Oriented X 3.  3. No F.N deficits, ALL C.Nerves Intact, Strength 5/5 all 4 extremities, Sensation intact all 4 extremities, Plantars down going.  4. Ears and Eyes appear Normal, Conjunctivae clear, PERRLA. Moist Oral Mucosa.  5. Supple Neck, No JVD, No cervical lymphadenopathy appriciated, No Carotid Bruits.  6. Symmetrical Chest wall movement, Good air movement bilaterally, slight decrease in bs right lung base, slight crackle right lung base, no wheezing  7. RRR, No Gallops, Rubs or Murmurs, No Parasternal Heave.  8. Positive Bowel Sounds, Abdomen Soft, No tenderness, No organomegaly appriciated,No rebound -guarding or rigidity.  9.  No Cyanosis, Normal Skin Turgor, No Skin Rash or Bruise.  10. Good muscle tone,  joints appear normal , no effusions, Normal ROM.  11. No Palpable Lymph Nodes in Neck or Axillae  8cm decubitus skin ulcer  R AVF   Data Review:    CBC  Recent Labs Lab 09/03/17 0323  WBC 15.9*  HGB 10.3*  HCT 32.5*  PLT 113*  MCV 101.6*  MCH 32.2  MCHC 31.7  RDW 22.2*  LYMPHSABS 1.0  MONOABS 0.6  EOSABS 0.0  BASOSABS 0.0    ------------------------------------------------------------------------------------------------------------------  Chemistries   Recent Labs Lab 09/03/17 0323  NA 134*  K 4.3  CL 96*  CO2 25  GLUCOSE 147*  BUN 16  CREATININE 2.76*  CALCIUM 8.7*  AST 53*  ALT 14*  ALKPHOS 116  BILITOT 3.2*   ------------------------------------------------------------------------------------------------------------------ estimated creatinine clearance is 28.7 mL/min (A) (by C-G formula based on SCr of 2.76 mg/dL (H)). ------------------------------------------------------------------------------------------------------------------ No results for input(s): TSH, T4TOTAL, T3FREE, THYROIDAB in the last 72 hours.  Invalid input(s): FREET3  Coagulation profile  Recent Labs Lab 09/02/17 09/03/17 0323  INR 1.8 2.37   ------------------------------------------------------------------------------------------------------------------- No results for input(s): DDIMER in the last 72 hours. -------------------------------------------------------------------------------------------------------------------  Cardiac Enzymes No results for input(s): CKMB, TROPONINI, MYOGLOBIN in the last 168 hours.  Invalid input(s): CK ------------------------------------------------------------------------------------------------------------------    Component Value Date/Time   BNP 1,553.9 (H) 07/09/2017 2151   BNP 89.8 06/18/2013 1006     ---------------------------------------------------------------------------------------------------------------  Urinalysis    Component Value Date/Time   COLORURINE YELLOW 07/09/2017 2215   APPEARANCEUR HAZY (A) 07/09/2017 2215   LABSPEC 1.015 07/09/2017 2215   PHURINE 5.0 07/09/2017 2215   GLUCOSEU 100 (A) 07/09/2017 2215   HGBUR TRACE (A)  07/09/2017 [place] 07/09/2017 2215   KETONESUR NEGATIVE 07/09/2017 2215   PROTEINUR NEGATIVE 07/09/2017  2215   UROBILINOGEN 1.0 08/23/2015 0322   NITRITE NEGATIVE 07/09/2017 2215   LEUKOCYTESUR TRACE (A) 07/09/2017 2215    ----------------------------------------------------------------------------------------------------------------   Imaging Results:    Dg Chest 2 View  Result Date: 09/03/2017 CLINICAL DATA:  Hemoptysis today EXAM: CHEST  2 VIEW COMPARISON:  07/24/2017 FINDINGS: Stable cardiomegaly with ICD device projecting over the left hemithorax and leads in the right atrium, coronary sinus and right ventricle. Hazy opacity at the right lung base is noted suspicious for pneumonia and/or layering right effusion. Degenerate changes are seen about both glenohumeral and AC joints. IMPRESSION: 1. Hazy densities at the right lung base suspicious for pneumonia and/or layering effusion. 2. Stable cardiomegaly with ICD device in place. Electronically Signed   By: Hsiung Royalty M.D.   On: 09/03/2017 03:26   nsr at 75, nl axis, RBBB  Q in v2-6   Assessment & Plan:    Principal Problem:   HCAP (healthcare-associated pneumonia) Active Problems:   DM type 2, uncontrolled, with renal complications (HCC)   Abnormal LFTs   Hyponatremia   Hemoptysis    Hcap Blood culture x2 Sputum gram stain/culture vanco iv, cefepime iv pharmacy to dose  Hemoptysis CT chest w/o contrast If persistent may need to d/c coumadin  Abnormal lft Check acute hepatitis panel  Check cmp in am  ESRD on HD M, W, F Consult nephrology please in am to ensure receives dialysis, pt last had dialysis 9/28, finished complete treatment He would like to change to [facility] dialysis center if possible  CHF (EF 20%) Stable  Pafib Coumadin pharmacy to dose  Anemia/Thrombocytopenia Check cbc in am  Dm2 fsbs ac and qhs, ISS  Decubitus ulcer Wound care RN consult  Hypotension Trop I q6h x3 Consider cardiac echo if persistent Consider cortisol if persistent  Hx of MSSA bacteremia,per ID continue keflex  indefinitely So upon discharge will need to be restarted on keflex   DVT Prophylaxis Coumadin  AM Labs Ordered, also please review Full Orders  Family Communication: Admission, patients condition and plan of care including tests being ordered have been discussed with the patient  who indicate understanding and agree with the plan and Code Status.  Code Status FULL CODE  Likely DC to  home  Condition GUARDED   Consults called:   none  Admission status: inpatient  Time spent in minutes : 45    Jani Gravel M.D on 09/03/2017 at 6:13 AM  Between 7am to 7pm - Pager - [phone]. After 7pm go to [email or web address] - password [facility]  [facility] - Office  [phone]

## 2017-09-03 NOTE — ED Provider Notes (Signed)
[name] DEPT Provider Note   CSN: [id number] Arrival date & time: 09/03/17  0224     History   Chief Complaint No chief complaint on file.   HPI [name] is a 61 y.o. male.  Patient presents to the emergency department for evaluation of hemoptysis. Patient reports that this evening he started coughing and there was bright red blood. This cleared and then he had another episode of coughing which was pink, mixed with normal sputum. He has not experiencing any chest pain. Does not feel short of breath currently. He is a dialysis patient, had a normal dialysis session earlier today.      Past Medical History:  Diagnosis Date  . Adenocarcinoma of rectum ([place])    a. 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 AICD (automatic cardioverter/defibrillator) present 2002   BI V ICD  . Anemia   . CAD (coronary artery disease)    a. BMS [facility] LAD 2001 [facility] b. PTCA/atherectomy ramus and BMS to LAD 2009  . CHF (congestive heart failure) ([place])   . Cholelithiasis 06/2015  . Chronic kidney disease, stage IV (severe) ([place])   . Chronic systolic heart failure ([place])   . Cirrhosis ([place])   . Colostomy in place [facility])   . Dizziness    a. chronic. Admission for this 07/18/2014  . DM type 2, uncontrolled, with renal complications ([place])   . Dysrhythmia   . Essential hypertension, benign   . GERD (gastroesophageal reflux disease)   . HCAP (healthcare-associated pneumonia) 07/21/2015  . Hematuria   . History of blood transfusion    "I've had 2 units so far this year" (09/27/2015)  . HLD (hyperlipidemia)   . Ischemic cardiomyopathy    EF 18% by nuclear study 2016, multiple myocardial infarctions in past    . Myocardial infarction ([place]) 2001  . Obesity   . Orthostatic hypotension   . OSA on CPAP   . Paroxysmal atrial fibrillation (HCC)    a. on amiodarone, digoxin and Eliquis  . PONV (postoperative nausea and vomiting)   . Prostate cancer ([place])    a. s/p seed implants with chemo and radiation  . SAH  (subarachnoid hemorrhage) ([place])    post-traumatic (fall) [facility] 12/2014  . TIA (transient ischemic attack)     Patient Active Problem List   Diagnosis Date Noted  . Hypoglycemia   . Severe protein-calorie malnutrition ([place])   . Acute blood loss anemia   . Type 2 diabetes mellitus with peripheral neuropathy (HCC)   . Chronic systolic heart failure ([name])   . Colostomy care ([place])   . Chronic kidney disease (CKD), stage IV (severe) ([place])   . Dependence on renal dialysis ([place])   . Wounds, multiple   . Supratherapeutic INR   . MSSA bacteremia 08/02/2017  . ESRD (end stage renal disease) ([place])   . Debility   . Acute on chronic systolic CHF (congestive heart failure) ([place])   . Bacteremia due to methicillin susceptible Staphylococcus aureus (MSSA)   . Infected defibrillator ([place])   . Septic hip ([place])   . Staphylococcus aureus bacteremia with sepsis ([place])   . Severe sepsis with septic shock ([place])   . Cardiogenic shock ([place]) 07/10/2017  . Septic shock ([place]) 07/10/2017  . Cardiorenal syndrome with renal failure   . Thrombocytopenia ([place]) 06/14/2017  . GI bleed 04/27/2017  . Portal hypertensive gastropathy ([place]) 04/27/2017  . Hypokalemia 04/27/2017  . Esophageal dysphagia 04/13/2017  . Hepatic cirrhosis ([place]) 02/24/2017  . Acute on chronic systolic heart  failure ([place])   . Biliary disease with obstruction   . AKI (acute kidney injury) ([place])   . Abnormal LFTs 05/10/2016  . Pain in the chest   . Right heart failure ([place]) 09/26/2015  . History of colonic polyps 09/24/2015  . Chronic atrial fibrillation ([place])   . Bleeding from colostomy stoma ([place]) 08/23/2015  . Anemia of chronic disease 07/21/2015  . Constipation 05/01/2015  . Transaminitis 05/01/2015  . NSTEMI- 04/23/15- Myoview scar- no cath 04/27/2015  . Acute respiratory failure ([place]) 04/23/2015  . Chronic cholecystitis 04/04/2015  . Cystitis with hematuria-April 2016 04/04/2015  . Chronically elevated transaminase level-Amiodarone  resumed 04/24/15 04/03/2015  . H/O ventricular fibrillation-March 2016 02/24/2015  . BiV ICD (St Jude) gen change 04/10/15 02/24/2015  . Hyperlipidemia   . Dietary noncompliance   . Orthostatic hypotension 07/19/2014  . OSA on CPAP   . GERD (gastroesophageal reflux disease) 06/05/2014  . Gallbladder polyp 03/06/2014  . DM type 2, uncontrolled, with renal complications ([place]) 81/19/1478  . Dizziness 01/26/2014  . Obesity 12/12/2013  . Chronic hypotension 12/11/2013  . Chronic kidney disease, stage IV (severe) ([place]) 12/11/2013  . Chronic a-fib ([place])   . CAD with LCX disease and stents to LAD 07/10/2013  . Long term current use of anticoagulant therapy 03/23/2013  . History of rectal cancer   . Ischemic cardiomyopathy-EF 35% 04/24/15 echo     Past Surgical History:  Procedure Laterality Date  . Abdominal and Perineal Resection of Rectum with Total Mesorectal Excision  10/04/2007  . AV FISTULA PLACEMENT Right 09/16/2015   Procedure: ARTERIOVENOUS (AV) FISTULA CREATION - BRACHIOCEPHALIC;  Surgeon: Elam Dutch, MD;  Location: [place];  Service: Vascular;  Laterality: Right;  . BI-VENTRICULAR IMPLANTABLE CARDIOVERTER DEFIBRILLATOR  (CRT-D)  2009  . CARDIAC CATHETERIZATION  08/2001; 2009   ; [name] 07/10/2013  . CARDIAC DEFIBRILLATOR PLACEMENT  2002  . CARDIAC DEFIBRILLATOR PLACEMENT  2009   Upgraded to a BiV ICD  . COLONOSCOPY  09/14/2011   Dr. Gala Romney: via colostomy, Single pedunculated benign inflammatory polyp. Due for surveillance Oct 2015  . COLONOSCOPY N/A 07/02/2014   Procedure: COLONOSCOPY;  Surgeon: Daneil Dolin, MD;  [facility];  Service: Endoscopy;  Laterality: N/A;  7:30 / COLONOSCOPY THRU COLOSTOMY  . COLONOSCOPY N/A 12/11/2014   Dr. Gala Romney via colostomy. Normal. Repeat in 2021.   Marland Kitchen COLONOSCOPY N/A 08/24/2015   Dr. Havery Moros: diminutive polyp not removed, stoma site of bleeding  . COLOSTOMY  2008  . CORONARY ANGIOPLASTY WITH STENT PLACEMENT  2001; ~ 2006   "1 + 1"     . ELECTROPHYSIOLOGIC STUDY N/A 08/28/2015   Procedure: AV Node Ablation;  Surgeon: Will Meredith Leeds, MD;  Location: [place] CV LAB;  Service: Cardiovascular;  Laterality: N/A;  . EP IMPLANTABLE DEVICE N/A 04/10/2015   Procedure: Ppm/Biv Ppm Generator Changeout;  Surgeon: Evans Lance, MD;  Location: [place] INVASIVE CV LAB CUPID;  Service: Cardiovascular;  Laterality: N/A;  . ERCP N/A 06/11/2016   Procedure: ENDOSCOPIC RETROGRADE CHOLANGIOPANCREATOGRAPHY (ERCP);  Surgeon: Teena Irani, MD;  Location: [facility] ENDOSCOPY;  Service: Endoscopy;  Laterality: N/A;  . ERCP N/A 01/26/2017   Procedure: ENDOSCOPIC RETROGRADE CHOLANGIOPANCREATOGRAPHY (ERCP);  Surgeon: Teena Irani, MD;  Location: [facility] ENDOSCOPY;  Service: Endoscopy;  Laterality: N/A;  . ERCP  06/2016   Dr. Amedeo Plenty: duodenal fistula, dilated bile duct but no obvious stones, s/p sphincterotomy and stent placement  . ERCP  01/2017   Dr. Amedeo Plenty: CBD stones, s/p sphincterotomy, balloon  extraction, stent removal  . ESOPHAGOGASTRODUODENOSCOPY N/A 07/02/2014   Procedure: ESOPHAGOGASTRODUODENOSCOPY (EGD);  Surgeon: Daneil Dolin, MD;  [facility];  Service: Endoscopy;  Laterality: N/A;  7:30  . ESOPHAGOGASTRODUODENOSCOPY N/A 12/11/2014   [id number]:JQBHAL EGD  . ESOPHAGOGASTRODUODENOSCOPY (EGD) WITH PROPOFOL N/A 04/18/2017   Procedure: ESOPHAGOGASTRODUODENOSCOPY (EGD) WITH PROPOFOL;  Surgeon: Daneil Dolin, MD;  [facility];  Service: Endoscopy;  Laterality: N/A;  2:30pm  . GASTROINTESTINAL STENT REMOVAL N/A 01/26/2017   Procedure: GASTROINTESTINAL STENT REMOVAL;  Surgeon: Teena Irani, MD;  Location: [place];  Service: Endoscopy;  Laterality: N/A;  . GIVENS CAPSULE STUDY N/A 07/23/2015   Dr. Michail Sermon: small non-bleeding AVM, mild gastritis  . IR FLUORO GUIDE CV LINE RIGHT  07/27/2017  . IR REMOVAL TUN CV CATH W/O FL  08/12/2017  . IR US GUIDE VASC ACCESS RIGHT  07/27/2017  . LEFT HEART CATHETERIZATION WITH CORONARY ANGIOGRAM N/A 07/13/2013    Procedure: LEFT HEART CATHETERIZATION WITH CORONARY ANGIOGRAM;  Surgeon: Lorretta Harp, MD;  Location: [facility] C[facility] LAB;  Service: Cardiovascular;  Laterality: N/A;  . Venia Minks DILATION N/A 07/02/2014   Procedure: Venia Minks DILATION;  Surgeon: Daneil Dolin, MD;  [facility];  Service: Endoscopy;  Laterality: N/A;  7:30  . MALONEY DILATION N/A 04/18/2017   Procedure: Venia Minks DILATION;  Surgeon: Daneil Dolin, MD;  [facility];  Service: Endoscopy;  Laterality: N/A;  . PORTACATH PLACEMENT  06/2007   "removed ~ 1 yr later"  . RIGHT HEART CATHETERIZATION N/A 02/24/2015   Procedure: RIGHT HEART CATH;  Surgeon: Jolaine Artist, MD;  Location: [facility] CATH LAB;  Service: Cardiovascular;  Laterality: N/A;  . SAVORY DILATION N/A 07/02/2014   Procedure: SAVORY DILATION;  Surgeon: Daneil Dolin, MD;  [facility];  Service: Endoscopy;  Laterality: N/A;  7:30       Home Medications    Prior to Admission medications   Medication Sig Start Date End Date Taking? Authorizing Provider  cephALEXin (KEFLEX) 250 MG capsule Take 1 capsule (250 mg total) by mouth 2 (two) times daily. 08/25/17 09/04/17 Yes Michel Bickers, MD  colchicine 0.6 MG tablet Take 0.5 tablets (0.3 mg total) by mouth daily. 08/19/17  Yes Angiulli, Lavon Paganini, PA-C  collagenase (SANTYL) ointment Apply topically daily. Applied to affected areas directed 08/19/17  Yes Angiulli, Lavon Paganini, PA-C  glucose blood test strip Use to test blood sugar 3 times daily 08/19/17  Yes Angiulli, Lavon Paganini, PA-C  HYDROcodone-acetaminophen (NORCO) 7.5-325 MG tablet Take 1-2 tablets by mouth every 4 (four) hours as needed for moderate pain. 08/19/17  Yes Angiulli, Lavon Paganini, PA-C  ketorolac (TORADOL) 10 MG tablet TAKE 1 TABLET BY MOUTH 4 TIMES A DAY AS NEEDED FOR PAIN 07/07/17  Yes [provider]  loratadine (CLARITIN) 10 MG tablet Take 1 tablet (10 mg total) by mouth daily. 08/19/17  Yes Angiulli, Lavon Paganini, PA-C  midodrine (PROAMATINE)  10 MG tablet Take 1 tablet (10 mg total) by mouth 3 (three) times daily with meals. 08/19/17  Yes Angiulli, Lavon Paganini, PA-C  multivitamin (RENA-VIT) TABS tablet Take 1 tablet by mouth at bedtime. 08/19/17  Yes Angiulli, Lavon Paganini, PA-C  nitroGLYCERIN (NITROSTAT) 0.4 MG SL tablet Place 1 tablet (0.4 mg total) under the tongue every 5 (five) minutes as needed for chest pain. 08/19/17 11/17/17 Yes Angiulli, Lavon Paganini, PA-C  pantoprazole (PROTONIX) 40 MG tablet Take 1 tablet (40 mg total) by mouth daily. 08/19/17  Yes [place],  Lavon Paganini, PA-C  polyethylene glycol (MIRALAX / GLYCOLAX) packet Take 17 g by mouth daily as needed for mild constipation. 04/28/17  Yes Lavina Hamman, MD  PROAIR RESPICLICK 528 (90 BASE) MCG/ACT AEPB Inhale 1 puff into the lungs every 6 (six) hours as needed (for breathing).  08/15/15  Yes [provider]  sucroferric oxyhydroxide (VELPHORO) 500 MG chewable tablet Chew 1 tablet (500 mg total) by mouth 3 (three) times daily with meals. 08/19/17  Yes Angiulli, Lavon Paganini, PA-C  warfarin (COUMADIN) 1 MG tablet Take 1 tablet (1 mg total) by mouth daily at 6 PM. 08/19/17  Yes Angiulli, Lavon Paganini, PA-C  insulin aspart (NOVOLOG) 100 UNIT/ML FlexPen Inject 3 Units into the skin 3 (three) times daily with meals. Patient not taking: Reported on 09/03/2017 08/19/17   Angiulli, Lavon Paganini, PA-C  Insulin Glargine (LANTUS) 100 UNIT/ML Solostar Pen Inject 8 Units into the skin daily at 10 pm. Patient not taking: Reported on 09/03/2017 08/19/17   Angiulli, Lavon Paganini, PA-C    Family History Family History  Problem Relation Age of Onset  . Colon cancer Mother 12  . Coronary artery disease Father   . Colon cancer Sister 79  . Diabetes Sister   . Colon cancer Other        2 cousins, succumbed to illness    Social History Social History  Substance Use Topics  . Smoking status: Never Smoker  . Smokeless tobacco: Never Used  . Alcohol use No     Comment: Former user 45 years ago     Allergies     Patient has no known allergies.   Review of Systems Review of Systems  Respiratory: Positive for cough.   All other systems reviewed and are negative.    Physical Exam Updated Vital Signs BP (!) 91/57   Pulse 74   Temp 98.3 F (36.8 C) (Oral)   Resp (!) 22   Ht 5\' 10"  (1.778 m)   Wt 78 kg (172 lb)   SpO2 91%   BMI 24.68 kg/m   Physical Exam  Constitutional: He is oriented to person, place, and time. He appears well-developed and well-nourished. No distress.  HENT:  Head: Normocephalic and atraumatic.  Right Ear: Hearing normal.  Left Ear: Hearing normal.  Nose: Nose normal.  Mouth/Throat: Oropharynx is clear and moist and mucous membranes are normal.  Eyes: Pupils are equal, round, and reactive to light. Conjunctivae and EOM are normal.  Neck: Normal range of motion. Neck supple.  Cardiovascular: Regular rhythm, S1 normal and S2 normal.  Exam reveals no gallop and no friction rub.   No murmur heard. Pulmonary/Chest: Effort normal. No respiratory distress. He has rhonchi. He exhibits no tenderness.  Abdominal: Soft. Normal appearance and bowel sounds are normal. There is no hepatosplenomegaly. There is no tenderness. There is no rebound, no guarding, no tenderness at McBurney's point and negative Murphy's sign. No hernia.  Musculoskeletal: Normal range of motion.  Neurological: He is alert and oriented to person, place, and time. He has normal strength. No cranial nerve deficit or sensory deficit. Coordination normal. GCS eye subscore is 4. GCS verbal subscore is 5. GCS motor subscore is 6.  Skin: Skin is warm, dry and intact. No rash noted. No cyanosis.  Psychiatric: He has a normal mood and affect. His speech is normal and behavior is normal. Thought content normal.  Nursing note and vitals reviewed.    ED Treatments / Results  Labs (all labs ordered are listed,  but only abnormal results are displayed) Labs Reviewed  CBC WITH DIFFERENTIAL/PLATELET - Abnormal;  Notable for the following:       Result Value   WBC 15.9 (*)    RBC 3.20 (*)    Hemoglobin 10.3 (*)    HCT 32.5 (*)    MCV 101.6 (*)    RDW 22.2 (*)    Platelets 113 (*)    Neutro Abs 14.3 (*)    All other components within normal limits  COMPREHENSIVE METABOLIC PANEL - Abnormal; Notable for the following:    Sodium 134 (*)    Chloride 96 (*)    Glucose, Bld 147 (*)    Creatinine, Ser 2.76 (*)    Calcium 8.7 (*)    Albumin 2.3 (*)    AST 53 (*)    ALT 14 (*)    Total Bilirubin 3.2 (*)    GFR calc non Af Amer 23 (*)    GFR calc Af Amer 27 (*)    All other components within normal limits  PROTIME-INR - Abnormal; Notable for the following:    Prothrombin Time 25.7 (*)    All other components within normal limits  I-STAT TROPONIN, ED    EKG  EKG Interpretation  Date/Time:  Saturday September 03 2017 02:38:39 EDT Ventricular Rate:  74 PR Interval:    QRS Duration: 160 QT Interval:  447 QTC Calculation: 496 R Axis:   175 Text Interpretation:  Sinus rhythm RBBB and LPFB No significant change since last tracing Confirmed by Orpah Greek [phone]) on 09/03/2017 2:42:26 AM       Radiology Dg Chest 2 View  Result Date: 09/03/2017 CLINICAL DATA:  Hemoptysis today EXAM: CHEST  2 VIEW COMPARISON:  07/24/2017 FINDINGS: Stable cardiomegaly with ICD device projecting over the left hemithorax and leads in the right atrium, coronary sinus and right ventricle. Hazy opacity at the right lung base is noted suspicious for pneumonia and/or layering right effusion. Degenerate changes are seen about both glenohumeral and AC joints. IMPRESSION: 1. Hazy densities at the right lung base suspicious for pneumonia and/or layering effusion. 2. Stable cardiomegaly with ICD device in place. Electronically Signed   By: Carithers Royalty M.D.   On: 09/03/2017 03:26    Procedures Procedures (including critical care time)  Medications Ordered in ED Medications - No data to display   Initial  Impression / Assessment and Plan / ED Course  I have reviewed the triage vital signs and the nursing notes.  Pertinent labs & imaging results that were available during my care of the patient were reviewed by me and considered in my medical decision making (see chart for details).     Patient presents to the emergency department for evaluation of hemoptysis. Patient does take Coumadin secondary to a history of atrial fibrillation. Recent INRs have been therapeutic or slightly subtherapeutic, no overtly elevated INRs. Patient does not have a history of PE. He does not report shortness of breath associated with the symptoms.  Patient hypotensive at arrival, but this appears to be about his baseline from recent hospitalization. Oxygen saturations were 90%, low for him. Chest x-ray suspicious for pneumonia or possible effusion. Based on his recent illness, would also consider PE, cannot obtain CT angiography at this time because of his kidney function. He is currently getting dialysis, but has only been on dialysis for several weeks. This is a result of his recent hospitalization for bacteremia, septic shock.  Will initiate broad-spectrum antibiotic coverage and have  patient readmitted to the hospital.  Final Clinical Impressions(s) / ED Diagnoses   Final diagnoses:  HCAP (healthcare-associated pneumonia)    New Prescriptions New Prescriptions   No medications on file     Orpah Greek, MD 09/03/17 [phone]

## 2017-09-03 NOTE — Progress Notes (Addendum)
Pharmacy Antibiotic Note  [name] is a 61 y.o. male admitted on 09/03/2017 with pneumonia.  Pharmacy has been consulted for Vancomycin/Cefepime dosing. Pt has ESRD on HD MWF, WBC elevated.   Plan: Vancomycin 1500 mg IV x 1, then 750 mg IV qHD MWF Cefepime 2g IV at 2000 on MWF, first dose now Trend WBC, temp, HD schedule  F/U infectious work-up Drug levels as indicated   Height: 5\' 10"  (177.8 cm) Weight: 172 lb (78 kg) IBW/kg (Calculated) : 73  Temp (24hrs), Avg:98.3 F (36.8 C), Min:98.3 F (36.8 C), Max:98.3 F (36.8 C)   Recent Labs Lab 09/03/17 0323  WBC 15.9*  CREATININE 2.76*    Estimated Creatinine Clearance: 28.7 mL/min (A) (by C-G formula based on SCr of 2.76 mg/dL (H)).    No Known Allergies   Narda Bonds 09/03/2017 5:51 AM

## 2017-09-03 NOTE — Progress Notes (Signed)
Lab result troponin 0.12 Paged Dr. Raeanne Barry.

## 2017-09-03 NOTE — ED Triage Notes (Signed)
Pt. Was watching when pt. Spit up bright red blood that then turned pink. Pt. Reports it happening around 11 tonight.

## 2017-09-04 ENCOUNTER — Inpatient Hospital Stay (HOSPITAL_COMMUNITY): Payer: Medicare Other

## 2017-09-04 DIAGNOSIS — J90 Pleural effusion, not elsewhere classified: Secondary | ICD-10-CM

## 2017-09-04 DIAGNOSIS — E871 Hypo-osmolality and hyponatremia: Secondary | ICD-10-CM

## 2017-09-04 LAB — GLUCOSE, CAPILLARY
GLUCOSE-CAPILLARY: 168 mg/dL — AB (ref 65–99)
Glucose-Capillary: 92 mg/dL (ref 65–99)
Glucose-Capillary: 130 mg/dL — ABNORMAL HIGH (ref 65–99)
Glucose-Capillary: 89 mg/dL (ref 65–99)

## 2017-09-04 LAB — RESPIRATORY PANEL BY PCR
Adenovirus: NOT DETECTED
BORDETELLA PERTUSSIS-RVPCR: NOT DETECTED
CORONAVIRUS 229E-RVPPCR: NOT DETECTED
CORONAVIRUS OC43-RVPPCR: NOT DETECTED
Chlamydophila pneumoniae: NOT DETECTED
Coronavirus HKU1: NOT DETECTED
Coronavirus NL63: NOT DETECTED
INFLUENZA A-RVPPCR: NOT DETECTED
INFLUENZA B-RVPPCR: NOT DETECTED
METAPNEUMOVIRUS-RVPPCR: NOT DETECTED
MYCOPLASMA PNEUMONIAE-RVPPCR: NOT DETECTED
PARAINFLUENZA VIRUS 2-RVPPCR: NOT DETECTED
PARAINFLUENZA VIRUS 4-RVPPCR: NOT DETECTED
Parainfluenza Virus 1: NOT DETECTED
Parainfluenza Virus 3: NOT DETECTED
RESPIRATORY SYNCYTIAL VIRUS-RVPPCR: NOT DETECTED
Rhinovirus / Enterovirus: NOT DETECTED

## 2017-09-04 LAB — COMPREHENSIVE METABOLIC PANEL
ALBUMIN: 2.3 g/dL — AB (ref 3.5–5.0)
ALT: 14 U/L — AB (ref 17–63)
AST: 45 U/L — AB (ref 15–41)
Alkaline Phosphatase: 98 U/L (ref 38–126)
Anion gap: 16 — ABNORMAL HIGH (ref 5–15)
BUN: 26 mg/dL — AB (ref 6–20)
CHLORIDE: 96 mmol/L — AB (ref 101–111)
CO2: 21 mmol/L — AB (ref 22–32)
CREATININE: 3.53 mg/dL — AB (ref 0.61–1.24)
Calcium: 8.8 mg/dL — ABNORMAL LOW (ref 8.9–10.3)
GFR calc Af Amer: 20 mL/min — ABNORMAL LOW (ref >60)
GFR calc non Af Amer: 17 mL/min — ABNORMAL LOW (ref >60)
Glucose, Bld: 109 mg/dL — ABNORMAL HIGH (ref 65–99)
Potassium: 4.2 mmol/L (ref 3.5–5.1)
SODIUM: 133 mmol/L — AB (ref 135–145)
Total Bilirubin: 3.3 mg/dL — ABNORMAL HIGH (ref 0.3–1.2)
Total Protein: 6.7 g/dL (ref 6.5–8.1)

## 2017-09-04 LAB — CBC
HEMATOCRIT: 33.6 % — AB (ref 39.0–52.0)
HEMOGLOBIN: 10.7 g/dL — AB (ref 13.0–17.0)
MCH: 32.6 pg (ref 26.0–34.0)
MCHC: 31.8 g/dL (ref 30.0–36.0)
MCV: 102.4 fL — ABNORMAL HIGH (ref 78.0–100.0)
Platelets: 90 10*3/uL — ABNORMAL LOW (ref 150–400)
RBC: 3.28 MIL/uL — ABNORMAL LOW (ref 4.22–5.81)
RDW: 22.5 % — ABNORMAL HIGH (ref 11.5–15.5)
WBC: 10.5 10*3/uL (ref 4.0–10.5)

## 2017-09-04 LAB — PROTIME-INR
INR: 2.28
Prothrombin Time: 24.9 seconds — ABNORMAL HIGH (ref 11.4–15.2)

## 2017-09-04 LAB — LACTATE DEHYDROGENASE, PLEURAL OR PERITONEAL FLUID: LD, Fluid: 58 U/L — ABNORMAL HIGH (ref 3–23)

## 2017-09-04 LAB — GRAM STAIN

## 2017-09-04 LAB — BODY FLUID CELL COUNT WITH DIFFERENTIAL
Eos, Fluid: 0 %
Lymphs, Fluid: 0 %
Monocyte-Macrophage-Serous Fluid: 2 % — ABNORMAL LOW (ref 50–90)
NEUTROPHIL FLUID: 98 % — AB (ref 0–25)
Total Nucleated Cell Count, Fluid: 797 cu mm (ref 0–1000)

## 2017-09-04 LAB — PROTEIN, PLEURAL OR PERITONEAL FLUID: Total protein, fluid: <3 g/dL

## 2017-09-04 LAB — HIV ANTIBODY (ROUTINE TESTING W REFLEX): HIV Screen 4th Generation wRfx: NONREACTIVE

## 2017-09-04 MED ORDER — LIDOCAINE HCL (PF) 1 % IJ SOLN
INTRAMUSCULAR | Status: AC
  Filled 2017-09-04: qty 10

## 2017-09-04 MED ORDER — LIDOCAINE-EPINEPHRINE 1 %-1:100000 IJ SOLN
INTRAMUSCULAR | Status: AC
  Filled 2017-09-04: qty 1

## 2017-09-04 MED ORDER — WARFARIN SODIUM 1 MG PO TABS
1.0000 mg | ORAL_TABLET | Freq: Once | ORAL | Status: AC
  Administered 2017-09-04: 1 mg via ORAL
  Filled 2017-09-04: qty 1

## 2017-09-04 NOTE — Progress Notes (Signed)
PROGRESS NOTE    [name]  IHK:[id number] DOB: 03/05/1956 DOA: 09/03/2017 PCP: Redmond School, MD   Brief Narrative:  TommyAshleyis a 61 y.o.male,w Dm2, SAH, Pafib, Mild MR, Mild AR, CAD, CHF (EF 20%) ESRD on HD (M,W, F), apparently c/o cough  with yellow sputum and mild hemoptysis. She was found to have health care associated pneumonia. Admitted for the management of hcap.   Assessment & Plan:   Principal Problem:   HCAP (healthcare-associated pneumonia) Active Problems:   DM type 2, uncontrolled, with renal complications ([place])   Abnormal LFTs   Hyponatremia   Hemoptysis   Health care associated pneumonia/ pleural effusion possibly para pneumonic effusion: Admitted for IV antibiotics, vancomycin and cefepime.  US guided thoracentesis on the right, yielding 1.8 liters of serous pleural fluid. Fluid sent for analysis.  Sputum and blood cultures are pending.  Influenza PCR is negative.     Diabetes mellitus:  CBG (last 3)   Recent Labs  09/03/17 2124 09/04/17 0837 09/04/17 1140  GLUCAP 107* 92 168*    Resume SSI.     Hemoptysis:  Improved.   ESRD on HD:  Further management as per renal.     H/o chronic thrombocytopenia No signs of hemoptysis today.  Monitor in am.    Leukocytosis resolved. Remains afebrile.    Hyponatremia from ESRD.    Macrocytic anemia:  Get vitamin b12 levels.  Hemoglobin stable around 10.    Sacral decubitus ulcer; stage 2.  Wound care consulted for recommendations.   DVT prophylaxis: scd's Code Status: full code.  Family Communication: non at bedside, discussed the plan with the patient.  Disposition Plan: pending pleural fluid analysis, and PT evaluation.    Consultants:   Wound care.   Procedures:US guided thoracentesis.  Antimicrobials: vancomycin and cefepime since admission.   Subjective: No new complaints.   Objective: Vitals:   09/04/17 0540 09/04/17 0956 09/04/17 1310 09/04/17 1345  BP:  (!) 101/54 (!) 91/59 101/69 108/65  Pulse: 74 78    Resp: 20 20    Temp: 97.7 F (36.5 C) 98 F (36.7 C)    TempSrc: Oral Oral    SpO2: 90% 95%    Weight:      Height:        Intake/Output Summary (Last 24 hours) at 09/04/17 1456 Last data filed at 09/04/17 1004  Gross per 24 hour  Intake              360 ml  Output                0 ml  Net              360 ml   Filed Weights   09/03/17 0247 09/03/17 2131  Weight: 78 kg (172 lb) 78.5 kg (173 lb 1.6 oz)    Examination:  General exam: Appears calm and comfortable on  oxygen.  Respiratory system: diminished at bases, no wheezing or rhonchi.  Cardiovascular system: S1 & S2 heard, RRR. No JVD, murmurs, rubs, gallops or clicks. No pedal edema. Gastrointestinal system: Abdomen is nondistended, soft and nontender. No organomegaly or masses felt. Normal bowel sounds heard. Central nervous system: Alert and oriented. No focal neurological deficits. Extremities: Symmetric 5 x 5 power. Skin: stage 2 sacral decubitus ulcer.  Psychiatry: Judgement and insight appear normal. Mood & affect appropriate.     Data Reviewed: I have personally reviewed following labs and imaging studies  CBC:  Recent Labs Lab 09/03/17 0323  09/04/17 0256  WBC 15.9* 10.5  NEUTROABS 14.3*  --   HGB 10.3* 10.7*  HCT 32.5* 33.6*  MCV 101.6* 102.4*  PLT 113* 90*   Basic Metabolic Panel:  Recent Labs Lab 09/03/17 0323 09/04/17 0256  NA 134* 133*  K 4.3 4.2  CL 96* 96*  CO2 25 21*  GLUCOSE 147* 109*  BUN 16 26*  CREATININE 2.76* 3.53*  CALCIUM 8.7* 8.8*   GFR: Estimated Creatinine Clearance: 22.4 mL/min (A) (by C-G formula based on SCr of 3.53 mg/dL (H)). Liver Function Tests:  Recent Labs Lab 09/03/17 0323 09/04/17 0256  AST 53* 45*  ALT 14* 14*  ALKPHOS 116 98  BILITOT 3.2* 3.3*  PROT 6.7 6.7  ALBUMIN 2.3* 2.3*   No results for input(s): LIPASE, AMYLASE in the last 168 hours. No results for input(s): AMMONIA in the last 168  hours. Coagulation Profile:  Recent Labs Lab 09/02/17 09/03/17 0323 09/04/17 0256  INR 1.8 2.37 2.28   Cardiac Enzymes:  Recent Labs Lab 09/03/17 0856 09/03/17 1240 09/03/17 1829  TROPONINI 0.12* 0.05* 0.06*   BNP (last 3 results) No results for input(s): PROBNP in the last 8760 hours. HbA1C:  Recent Labs  09/03/17 0856  HGBA1C 5.0   CBG:  Recent Labs Lab 09/03/17 1155 09/03/17 1711 09/03/17 2124 09/04/17 0837 09/04/17 1140  GLUCAP 178* 116* 107* 92 168*   Lipid Profile: No results for input(s): CHOL, HDL, LDLCALC, TRIG, CHOLHDL, LDLDIRECT in the last 72 hours. Thyroid Function Tests: No results for input(s): TSH, T4TOTAL, FREET4, T3FREE, THYROIDAB in the last 72 hours. Anemia Panel: No results for input(s): VITAMINB12, FOLATE, FERRITIN, TIBC, IRON, RETICCTPCT in the last 72 hours. Sepsis Labs: No results for input(s): PROCALCITON, LATICACIDVEN in the last 168 hours.  Recent Results (from the past 240 hour(s))  Culture, blood (routine x 2) Call MD if unable to obtain prior to antibiotics being given     Status: None (Preliminary result)   Collection Time: 09/03/17  8:56 AM  Result Value Ref Range Status   Specimen Description BLOOD BLOOD LEFT HAND  Final   Special Requests IN PEDIATRIC BOTTLE Blood Culture adequate volume  Final   Culture NO GROWTH 1 DAY  Final   Report Status PENDING  Incomplete  Culture, blood (routine x 2) Call MD if unable to obtain prior to antibiotics being given     Status: None (Preliminary result)   Collection Time: 09/03/17  9:00 AM  Result Value Ref Range Status   Specimen Description BLOOD BLOOD LEFT HAND  Final   Special Requests IN PEDIATRIC BOTTLE Blood Culture adequate volume  Final   Culture NO GROWTH 1 DAY  Final   Report Status PENDING  Incomplete  Respiratory Panel by PCR     Status: None   Collection Time: 09/03/17  5:32 PM  Result Value Ref Range Status   Adenovirus NOT DETECTED NOT DETECTED Final   Coronavirus  229E NOT DETECTED NOT DETECTED Final   Coronavirus HKU1 NOT DETECTED NOT DETECTED Final   Coronavirus NL63 NOT DETECTED NOT DETECTED Final   Coronavirus OC43 NOT DETECTED NOT DETECTED Final   Metapneumovirus NOT DETECTED NOT DETECTED Final   Rhinovirus / Enterovirus NOT DETECTED NOT DETECTED Final   Influenza A NOT DETECTED NOT DETECTED Final   Influenza B NOT DETECTED NOT DETECTED Final   Parainfluenza Virus 1 NOT DETECTED NOT DETECTED Final   Parainfluenza Virus 2 NOT DETECTED NOT DETECTED Final   Parainfluenza Virus 3 NOT DETECTED NOT  DETECTED Final   Parainfluenza Virus 4 NOT DETECTED NOT DETECTED Final   Respiratory Syncytial Virus NOT DETECTED NOT DETECTED Final   Bordetella pertussis NOT DETECTED NOT DETECTED Final   Chlamydophila pneumoniae NOT DETECTED NOT DETECTED Final   Mycoplasma pneumoniae NOT DETECTED NOT DETECTED Final  MRSA PCR Screening     Status: None   Collection Time: 09/03/17  5:32 PM  Result Value Ref Range Status   MRSA by PCR NEGATIVE NEGATIVE Final    Comment:        The GeneXpert MRSA Assay (FDA approved for NASAL specimens only), is one component of a comprehensive MRSA colonization surveillance program. It is not intended to diagnose MRSA infection nor to guide or monitor treatment for MRSA infections.          Radiology Studies: Dg Chest 1 View  Result Date: 09/04/2017 CLINICAL DATA:  Post right-sided thoracentesis EXAM: CHEST 1 VIEW COMPARISON:  09/03/2017; chest CT - 09/03/2017 FINDINGS: Grossly unchanged enlarged cardiac silhouette and mediastinal contours. Stable position of support apparatus. Interval reduction / near resolution of right-sided pleural effusion post thoracentesis. No pneumothorax. A small amount of fluid is seen tracking within the right major fissure. Overall improved aeration of the right lung base. Minimal bibasilar opacities favored to represent atelectasis. No new focal airspace opacities. No evidence of edema. No acute  osseus abnormalities. Degenerative change of the left glenohumeral joint is suspected though incompletely evaluated. IMPRESSION: 1. Interval reduction / near resolution of right-sided pleural effusion post thoracentesis. No pneumothorax. 2. Improved aeration of lung bases with persistent bibasilar opacities, likely atelectasis. Electronically Signed   By: Sandi Mariscal M.D.   On: 09/04/2017 14:16   Dg Chest 2 View  Result Date: 09/03/2017 CLINICAL DATA:  Hemoptysis today EXAM: CHEST  2 VIEW COMPARISON:  07/24/2017 FINDINGS: Stable cardiomegaly with ICD device projecting over the left hemithorax and leads in the right atrium, coronary sinus and right ventricle. Hazy opacity at the right lung base is noted suspicious for pneumonia and/or layering right effusion. Degenerate changes are seen about both glenohumeral and AC joints. IMPRESSION: 1. Hazy densities at the right lung base suspicious for pneumonia and/or layering effusion. 2. Stable cardiomegaly with ICD device in place. Electronically Signed   By: Danielsen Royalty M.D.   On: 09/03/2017 03:26   Ct Chest Wo Contrast  Result Date: 09/03/2017 CLINICAL DATA:  61 year old male with hemoptysis. EXAM: CT CHEST WITHOUT CONTRAST TECHNIQUE: Multidetector CT imaging of the chest was performed following the standard protocol without IV contrast. COMPARISON:  08/31/2017 and prior radiographs. 07/10/2017 abdominal CT. 05/26/2009 chest CT FINDINGS: Cardiovascular: Cardiomegaly, heavy coronary artery calcifications and pacemaker/ICD again noted. Thoracic aortic calcifications noted without aneurysm. No pericardial effusion. Mediastinum/Nodes: Multiple shotty mediastinal lymph nodes noted. No mediastinal mass. The trachea, esophagus and thyroid are unremarkable. Lungs/Pleura: Airspace disease/ consolidation within the right middle lobe is compatible with pneumonia or hemorrhage. Moderate to large right pleural effusion, moderate left pleural effusion are again noted, increased  on the left since 07/10/2017. Moderate bilateral lower lung atelectasis noted. No evidence of pneumothorax. Upper Abdomen: Ascites in the upper abdomen again noted. Musculoskeletal: No acute or suspicious bony abnormalities. IMPRESSION: 1. Right middle lobe airspace disease/consolidation, favor pneumonia over hemorrhage. 2. Moderate to large right pleural effusion and moderate left pleural effusion. Left pleural effusion has increased since 07/10/2017. 3. Cardiomegaly and coronary artery disease 4. Ascites again identified 5.  Aortic Atherosclerosis (ICD10-I70.0). Electronically Signed   By: Margarette Canada M.D.   On:  09/03/2017 15:58   US Thoracentesis Asp Pleural Space W/img Guide  Result Date: 09/04/2017 INDICATION: Symptomatic right sided pleural effusion EXAM: US THORACENTESIS ASP PLEURAL SPACE W/IMG GUIDE COMPARISON:  Chest CT - 09/03/2017; chest radiograph - 09/03/2017; 07/24/2017 MEDICATIONS: None. COMPLICATIONS: None immediate. TECHNIQUE: Informed written consent was obtained from the patient after a discussion of the risks, benefits and alternatives to treatment. A timeout was performed prior to the initiation of the procedure. Initial ultrasound scanning demonstrates a large anechoic right-sided pleural effusion. The lower chest was prepped and draped in the usual sterile fashion. 1% lidocaine was used for local anesthesia. An ultrasound image was saved for documentation purposes. An 8 Fr Safe-T-Centesis catheter was introduced. The thoracentesis was performed. The catheter was removed and a dressing was applied. The patient tolerated the procedure well without immediate post procedural complication. The patient was escorted to have an upright chest radiograph. FINDINGS: A total of approximately 1.8 liters of serous fluid was removed. Requested samples were sent to the laboratory. IMPRESSION: Successful ultrasound-guided right sided thoracentesis yielding 1.8 liters of pleural fluid. Electronically Signed    By: Sandi Mariscal M.D.   On: 09/04/2017 14:16        Scheduled Meds: . colchicine  0.3 mg Oral Daily  . collagenase   Topical Daily  . feeding supplement (NEPRO CARB STEADY)  237 mL Oral BID BM  . insulin aspart  0-5 Units Subcutaneous QHS  . insulin aspart  0-9 Units Subcutaneous TID WC  . loratadine  10 mg Oral Daily  . midodrine  10 mg Oral TID WC  . multivitamin  1 tablet Oral QHS  . pantoprazole  40 mg Oral Daily  . sodium chloride flush  3 mL Intravenous Q12H  . sucroferric oxyhydroxide  500 mg Oral TID WC  . warfarin  1 mg Oral ONCE-1800  . Warfarin - Pharmacist Dosing Inpatient   Does not apply q1800   Continuous Infusions: . sodium chloride    . ceFEPime (MAXIPIME) IV    . [START ON 09/05/2017] vancomycin       LOS: 1 day    Time spent: 35 minutes.     Hosie Poisson, MD [facility] Pager [phone]   If 7PM-7AM, please contact night-coverage [email or web address] Password [email or web address] 09/04/2017, 2:56 PM

## 2017-09-04 NOTE — Progress Notes (Signed)
[place] for warfarin Indication: atrial fibrillation  No Known Allergies  Patient Measurements: Height: 5\' 10"  (177.8 cm) Weight: 173 lb 1.6 oz (78.5 kg) IBW/kg (Calculated) : 73  Vital Signs: Temp: 98 F (36.7 C) (09/30 0956) Temp Source: Oral (09/30 0956) BP: 91/59 (09/30 0956) Pulse Rate: 78 (09/30 0956)  Labs:  Recent Labs  09/02/17 09/03/17 0323 09/03/17 0856 09/03/17 1240 09/03/17 1829 09/04/17 0256  HGB  --  10.3*  --   --   --  10.7*  HCT  --  32.5*  --   --   --  33.6*  PLT  --  113*  --   --   --  90*  LABPROT  --  25.7*  --   --   --  24.9*  INR 1.8 2.37  --   --   --  2.28  CREATININE  --  2.76*  --   --   --  3.53*  TROPONINI  --   --  0.12* 0.05* 0.06*  --     Estimated Creatinine Clearance: 22.4 mL/min (A) (by C-G formula based on SCr of 3.53 mg/dL (H)).   Assessment: 46 YOM with AFib on warfarin. Had INR checked on 9/28 which resulted at 1.8 and dose was changed to 1mg  daily except 2mg  on Fridays (previously 1mg  every day). Took warfarin 2mg  on 9/28.  INR 2.28 this morning.  Hgb 10.7, plts 90- no bleeding noted.  Goal of Therapy:  INR 2-3 Monitor platelets by anticoagulation protocol: Yes   Plan:  Warfarin 1mg  po x1 tonight Daily INR Follow for s/s bleeding  Terrika Zuver D. Aoi Kouns, PharmD, BCPS Clinical Pharmacist Pager: [phone] Clinical Phone for 09/04/2017 until 3:30pm: [phone] If after 3:30pm, please call main pharmacy at [phone] 09/04/2017 11:36 AM

## 2017-09-04 NOTE — Procedures (Signed)
Pre procedural Dx: Symptomatic Pleural effusion Post procedural Dx: Same  Successful US guided right sided thoracentesis yielding 1.8 L of serous pleural fluid.   Samples sent to lab for analysis.  EBL: None  Complications: None immediate.  Ronny Bacon, MD Pager #: [phone]

## 2017-09-04 NOTE — Progress Notes (Signed)
PT Cancellation Note  Patient Details Name: OWYNN MOSQUEDA MRN: [id number] DOB: July 14, 1956   Cancelled Treatment:    Reason Eval/Treat Not Completed: Patient at procedure or test/unavailable   Off the floor for ultrasound;   Will follow up later today as time allows;  Otherwise, will follow up for PT tomorrow;   Thank you,  Roney Marion, PT  Acute [facility] Services Pager [phone] Office [place] 09/04/2017, 1:52 PM

## 2017-09-04 NOTE — Progress Notes (Signed)
Returned from U/S guided thoracentesis in stable condition. VSS. Family present at bedside updated on POC. Will continue to monitor.

## 2017-09-04 NOTE — Progress Notes (Signed)
[facility] Progress Note  Subjective:  Seen in room, eating breakfast. Has no c/os. Denies CP, SOB, cough, hemoptysis  Objective Vitals:   09/03/17 0715 09/03/17 0839 09/03/17 2131 09/04/17 0540  BP: (!) 99/54 (!) 100/58 (!) 97/51 (!) 101/54  Pulse: 73 73 73 74  Resp: (!) 28 (!) 24 (!) 22 20  Temp:  98.1 F (36.7 C) 98.5 F (36.9 C) 97.7 F (36.5 C)  TempSrc:  Oral Oral Oral  SpO2: 92% 93% 94% 90%  Weight:   78.5 kg (173 lb 1.6 oz)   Height:       Physical Exam General: Elderly WM, debilitated, NAD  Heart: RRR  Lungs: Breathing is unlabored. Lung sounds diminished, scattered rhonchi  Abdomen:soft NT + BS; colostomy present, empty  Extremities: no LE edema or open wounds  Dialysis Access: RUE AVF +bruit    Dialysis Orders:  [facility] MWF  4h 180F BFR 400/800 3K/2.5Ca EDW 77kg  L AVF No heparin Mircera 50 mcg IV q 2 weeks (to start 10/3)  Venofer 50 mg IV q week (last 9/24)   Assessment/Plan: 1.   Hemoptysis/HCAP - CXR with densities in R Lung base, leukocytosis-  blood cultures pending, IV Vanc/Cefepime started. CT chest showing moderate to large right pleural effusion and moderate left pleural effusion that has increased in size form 07/2017  ?thoracentesis  2.  ESRD -  MWF Continue on schedule. No urgent HD needs, plan next HD Monday. Noted that patient wants to change centers to [facility] to be closer to home   3.  Hypertension/volume  -  BP soft on midodrine tid;UF to EDW  4.  Anemia  - Hgb 10.3 Follow trend 5.  Metabolic bone disease -  Ca ok/ No VDRA yet, No binders on outpatient med list- OP Phos ok; Will check with renal panel here  6.  Nutrition - Renal diet/vitamins/Nepro for low albumin  7. CHF - EF 15-20% BiV ICD in place  8. Afib - on warfarin  9. Decubitus ulcer - WOC   [name] PA-C [facility] Pager [phone] 09/04/2017,8:10 AM  LOS: 1 day   Additional Objective Labs: Basic Metabolic Panel:  Recent  Labs Lab 09/03/17 0323 09/04/17 0256  NA 134* 133*  K 4.3 4.2  CL 96* 96*  CO2 25 21*  GLUCOSE 147* 109*  BUN 16 26*  CREATININE 2.76* 3.53*  CALCIUM 8.7* 8.8*   Liver Function Tests:  Recent Labs Lab 09/03/17 0323 09/04/17 0256  AST 53* 45*  ALT 14* 14*  ALKPHOS 116 98  BILITOT 3.2* 3.3*  PROT 6.7 6.7  ALBUMIN 2.3* 2.3*   No results for input(s): LIPASE, AMYLASE in the last 168 hours. CBC:  Recent Labs Lab 09/03/17 0323 09/04/17 0256  WBC 15.9* 10.5  NEUTROABS 14.3*  --   HGB 10.3* 10.7*  HCT 32.5* 33.6*  MCV 101.6* 102.4*  PLT 113* 90*   Blood Culture    Component Value Date/Time   SDES BLOOD BLOOD LEFT HAND 07/11/2017 1625   SPECREQUEST  07/11/2017 1625    BOTTLES DRAWN AEROBIC ONLY Blood Culture adequate volume   CULT NO GROWTH 5 DAYS 07/11/2017 1625   REPTSTATUS 07/16/2017 FINAL 07/11/2017 1625    Cardiac Enzymes:  Recent Labs Lab 09/03/17 0856 09/03/17 1240 09/03/17 1829  TROPONINI 0.12* 0.05* 0.06*   CBG:  Recent Labs Lab 09/03/17 1039 09/03/17 1155 09/03/17 1711 09/03/17 2124  GLUCAP 176* 178* 116* 107*   Iron Studies: No results  for input(s): IRON, TIBC, TRANSFERRIN, FERRITIN in the last 72 hours. Lab Results  Component Value Date   INR 2.28 09/04/2017   INR 2.37 09/03/2017   INR 1.8 09/02/2017   Medications: . sodium chloride    . ceFEPime (MAXIPIME) IV    . [START ON 09/05/2017] vancomycin     . colchicine  0.3 mg Oral Daily  . collagenase   Topical Daily  . feeding supplement (NEPRO CARB STEADY)  237 mL Oral BID BM  . insulin aspart  0-5 Units Subcutaneous QHS  . insulin aspart  0-9 Units Subcutaneous TID WC  . loratadine  10 mg Oral Daily  . midodrine  10 mg Oral TID WC  . multivitamin  1 tablet Oral QHS  . pantoprazole  40 mg Oral Daily  . sodium chloride flush  3 mL Intravenous Q12H  . sucroferric oxyhydroxide  500 mg Oral TID WC  . Warfarin - Pharmacist Dosing Inpatient   Does not apply q1800    I have seen  and examined this patient and agree with plan and assessment in the above note with renal recommendations/intervention highlighted.  Feels better, continue with HD MWF while he remains an inpatient.   Robert John A Takoya Jonas,MD 09/04/2017 12:21 PM

## 2017-09-05 LAB — GLUCOSE, CAPILLARY
Glucose-Capillary: 133 mg/dL — ABNORMAL HIGH (ref 65–99)
Glucose-Capillary: 153 mg/dL — ABNORMAL HIGH (ref 65–99)

## 2017-09-05 LAB — CBC
HCT: 33.3 % — ABNORMAL LOW (ref 39.0–52.0)
Hemoglobin: 10.5 g/dL — ABNORMAL LOW (ref 13.0–17.0)
MCH: 31.3 pg (ref 26.0–34.0)
MCHC: 31.5 g/dL (ref 30.0–36.0)
MCV: 99.4 fL (ref 78.0–100.0)
PLATELETS: 91 10*3/uL — AB (ref 150–400)
RBC: 3.35 MIL/uL — AB (ref 4.22–5.81)
RDW: 21.9 % — AB (ref 11.5–15.5)
WBC: 8.5 10*3/uL (ref 4.0–10.5)

## 2017-09-05 LAB — RENAL FUNCTION PANEL
Albumin: 2.3 g/dL — ABNORMAL LOW (ref 3.5–5.0)
Anion gap: 13 (ref 5–15)
BUN: 22 mg/dL — ABNORMAL HIGH (ref 6–20)
CALCIUM: 8.1 mg/dL — AB (ref 8.9–10.3)
CHLORIDE: 93 mmol/L — AB (ref 101–111)
CO2: 24 mmol/L (ref 22–32)
CREATININE: 2.62 mg/dL — AB (ref 0.61–1.24)
GFR calc non Af Amer: 25 mL/min — ABNORMAL LOW (ref >60)
GFR, EST AFRICAN AMERICAN: 28 mL/min — AB (ref >60)
GLUCOSE: 112 mg/dL — AB (ref 65–99)
Phosphorus: 2.5 mg/dL (ref 2.5–4.6)
Potassium: 3.6 mmol/L (ref 3.5–5.1)
SODIUM: 130 mmol/L — AB (ref 135–145)

## 2017-09-05 LAB — PROTIME-INR
INR: 2.3
PROTHROMBIN TIME: 25.1 s — AB (ref 11.4–15.2)

## 2017-09-05 MED ORDER — WARFARIN SODIUM 1 MG PO TABS
1.0000 mg | ORAL_TABLET | Freq: Once | ORAL | Status: AC
  Administered 2017-09-05: 1 mg via ORAL
  Filled 2017-09-05 (×2): qty 1

## 2017-09-05 MED ORDER — MIDODRINE HCL 5 MG PO TABS
ORAL_TABLET | ORAL | Status: AC
  Filled 2017-09-05: qty 2

## 2017-09-05 MED ORDER — SODIUM CHLORIDE 0.9 % IV SOLN: 100.0000 mL | INTRAVENOUS | Status: DC | PRN

## 2017-09-05 MED ORDER — LIDOCAINE HCL (PF) 1 % IJ SOLN: 5.0000 mL | INTRAMUSCULAR | Status: DC | PRN

## 2017-09-05 MED ORDER — LIDOCAINE-PRILOCAINE 2.5-2.5 % EX CREA: 1.0000 "application " | TOPICAL_CREAM | CUTANEOUS | Status: DC | PRN

## 2017-09-05 MED ORDER — VANCOMYCIN HCL IN DEXTROSE 750-5 MG/150ML-% IV SOLN
INTRAVENOUS | Status: AC
  Administered 2017-09-05: 750 mg via INTRAVENOUS
  Filled 2017-09-05: qty 150

## 2017-09-05 MED ORDER — PENTAFLUOROPROP-TETRAFLUOROETH EX AERO: 1.0000 "application " | INHALATION_SPRAY | CUTANEOUS | Status: DC | PRN

## 2017-09-05 MED ORDER — HEPARIN SODIUM (PORCINE) 1000 UNIT/ML DIALYSIS: 1000.0000 [IU] | INTRAMUSCULAR | Status: DC | PRN

## 2017-09-05 MED ORDER — ALTEPLASE 2 MG IJ SOLR: 2.0000 mg | Freq: Once | INTRAMUSCULAR | Status: DC | PRN

## 2017-09-05 NOTE — Progress Notes (Signed)
PROGRESS NOTE    [name]  YBO:[id number] DOB: March 17, 1956 DOA: 09/03/2017 PCP: Redmond School, MD   Brief Narrative:  TommyAshleyis a 61 y.o.male,w Dm2, SAH, Pafib, Mild MR, Mild AR, CAD, CHF (EF 20%) ESRD on HD (M,W, F), apparently c/o cough  with yellow sputum and mild hemoptysis. She was found to have health care associated pneumonia. Admitted for the management of hcap.   Assessment & Plan:   Principal Problem:   HCAP (healthcare-associated pneumonia) Active Problems:   DM type 2, uncontrolled, with renal complications ([place])   Abnormal LFTs   Hyponatremia   Hemoptysis   Health care associated pneumonia/ pleural effusion possibly para pneumonic effusion: Admitted for IV antibiotics, vancomycin and cefepime.  US guided thoracentesis on the right, yielding 1.8 liters of serous pleural fluid. Fluid sent for analysis, so far gram stain is negative and cultures are pending.  Sputum cultures not sent and blood cultures are negative so far.  Influenza PCR is negative.  Repeat CXR shows near resolution of the right sided effusion    Diabetes mellitus:  CBG (last 3)   Recent Labs  09/04/17 1711 09/04/17 2142 09/05/17 1636  GLUCAP 130* 89 133*    Resume SSI.     Hemoptysis: possibly from the bronchitis and coughing and hcap. Improved.   ESRD on HD:  Further management as per renal.     H/o chronic thrombocytopenia No signs of hemoptysis today. No signs of bleeding. Platelets stable around 91,000. Monitor in am.    Leukocytosis resolved. Remains afebrile.    Hyponatremia from ESRD.    Macrocytic anemia:  Get vitamin b12 levels.  Hemoglobin stable around 10.    Sacral decubitus ulcer; unstage able. Wound care consulted for recommendations.   DVT prophylaxis: scd's Code Status: full code.  Family Communication: none at bedside, discussed the plan with the patient.  Disposition Plan: CIR PENDING.    Consultants:   Wound care.    Procedures:US guided thoracentesis.  Antimicrobials: vancomycin and cefepime since admission.   Subjective: No new complaints.   Objective: Vitals:   09/05/17 1100 09/05/17 1130 09/05/17 1158 09/05/17 1254  BP: (!) 93/54 (!) 91/49 (!) 94/45 96/61  Pulse: 71 70 69 70  Resp:   14 17  Temp:   98.2 F (36.8 C) 97.7 F (36.5 C)  TempSrc:   Oral Oral  SpO2:   96% 97%  Weight:   70 kg (154 lb 5.2 oz)   Height:        Intake/Output Summary (Last 24 hours) at 09/05/17 1727 Last data filed at 09/05/17 1158  Gross per 24 hour  Intake              120 ml  Output             2500 ml  Net            -2380 ml   Filed Weights   09/05/17 0738 09/05/17 0955 09/05/17 1158  Weight: 72.7 kg (160 lb 4.4 oz) 72.6 kg (160 lb) 70 kg (154 lb 5.2 oz)    Examination:  General exam: Appears calm and comfortable off oxygen.  Respiratory system: air entry fair, no wheezing or rhonchi.  Cardiovascular system: S1 S2, no JVD, no murmer. RRR.Marland Kitchen Gastrointestinal system: Abdomen is soft non tender non distended bowel sounds heard. . Central nervous system: alert and answering questions appropriately.  Extremities:no pedal edema.  Skin: sacral decubitus ulcer.  Psychiatry: Mood & affect appropriate.  Data Reviewed: I have personally reviewed following labs and imaging studies  CBC:  Recent Labs Lab 09/03/17 0323 09/04/17 0256 09/05/17 0329  WBC 15.9* 10.5 8.5  NEUTROABS 14.3*  --   --   HGB 10.3* 10.7* 10.5*  HCT 32.5* 33.6* 33.3*  MCV 101.6* 102.4* 99.4  PLT 113* 90* 91*   Basic Metabolic Panel:  Recent Labs Lab 09/03/17 0323 09/04/17 0256 09/05/17 0800  NA 134* 133* 130*  K 4.3 4.2 3.6  CL 96* 96* 93*  CO2 25 21* 24  GLUCOSE 147* 109* 112*  BUN 16 26* 22*  CREATININE 2.76* 3.53* 2.62*  CALCIUM 8.7* 8.8* 8.1*  PHOS  --   --  2.5   GFR: Estimated Creatinine Clearance: 28.9 mL/min (A) (by C-G formula based on SCr of 2.62 mg/dL (H)). Liver Function Tests:  Recent  Labs Lab 09/03/17 0323 09/04/17 0256 09/05/17 0800  AST 53* 45*  --   ALT 14* 14*  --   ALKPHOS 116 98  --   BILITOT 3.2* 3.3*  --   PROT 6.7 6.7  --   ALBUMIN 2.3* 2.3* 2.3*   No results for input(s): LIPASE, AMYLASE in the last 168 hours. No results for input(s): AMMONIA in the last 168 hours. Coagulation Profile:  Recent Labs Lab 09/02/17 09/03/17 0323 09/04/17 0256 09/05/17 0329  INR 1.8 2.37 2.28 2.30   Cardiac Enzymes:  Recent Labs Lab 09/03/17 0856 09/03/17 1240 09/03/17 1829  TROPONINI 0.12* 0.05* 0.06*   BNP (last 3 results) No results for input(s): PROBNP in the last 8760 hours. HbA1C:  Recent Labs  09/03/17 0856  HGBA1C 5.0   CBG:  Recent Labs Lab 09/04/17 0837 09/04/17 1140 09/04/17 1711 09/04/17 2142 09/05/17 1636  GLUCAP 92 168* 130* 89 133*   Lipid Profile: No results for input(s): CHOL, HDL, LDLCALC, TRIG, CHOLHDL, LDLDIRECT in the last 72 hours. Thyroid Function Tests: No results for input(s): TSH, T4TOTAL, FREET4, T3FREE, THYROIDAB in the last 72 hours. Anemia Panel: No results for input(s): VITAMINB12, FOLATE, FERRITIN, TIBC, IRON, RETICCTPCT in the last 72 hours. Sepsis Labs: No results for input(s): PROCALCITON, LATICACIDVEN in the last 168 hours.  Recent Results (from the past 240 hour(s))  Culture, blood (routine x 2) Call MD if unable to obtain prior to antibiotics being given     Status: None (Preliminary result)   Collection Time: 09/03/17  8:56 AM  Result Value Ref Range Status   Specimen Description BLOOD BLOOD LEFT HAND  Final   Special Requests IN PEDIATRIC BOTTLE Blood Culture adequate volume  Final   Culture NO GROWTH 2 DAYS  Final   Report Status PENDING  Incomplete  Culture, blood (routine x 2) Call MD if unable to obtain prior to antibiotics being given     Status: None (Preliminary result)   Collection Time: 09/03/17  9:00 AM  Result Value Ref Range Status   Specimen Description BLOOD BLOOD LEFT HAND  Final    Special Requests IN PEDIATRIC BOTTLE Blood Culture adequate volume  Final   Culture NO GROWTH 2 DAYS  Final   Report Status PENDING  Incomplete  Respiratory Panel by PCR     Status: None   Collection Time: 09/03/17  5:32 PM  Result Value Ref Range Status   Adenovirus NOT DETECTED NOT DETECTED Final   Coronavirus 229E NOT DETECTED NOT DETECTED Final   Coronavirus HKU1 NOT DETECTED NOT DETECTED Final   Coronavirus NL63 NOT DETECTED NOT DETECTED Final   Coronavirus OC43  NOT DETECTED NOT DETECTED Final   Metapneumovirus NOT DETECTED NOT DETECTED Final   Rhinovirus / Enterovirus NOT DETECTED NOT DETECTED Final   Influenza A NOT DETECTED NOT DETECTED Final   Influenza B NOT DETECTED NOT DETECTED Final   Parainfluenza Virus 1 NOT DETECTED NOT DETECTED Final   Parainfluenza Virus 2 NOT DETECTED NOT DETECTED Final   Parainfluenza Virus 3 NOT DETECTED NOT DETECTED Final   Parainfluenza Virus 4 NOT DETECTED NOT DETECTED Final   Respiratory Syncytial Virus NOT DETECTED NOT DETECTED Final   Bordetella pertussis NOT DETECTED NOT DETECTED Final   Chlamydophila pneumoniae NOT DETECTED NOT DETECTED Final   Mycoplasma pneumoniae NOT DETECTED NOT DETECTED Final  MRSA PCR Screening     Status: None   Collection Time: 09/03/17  5:32 PM  Result Value Ref Range Status   MRSA by PCR NEGATIVE NEGATIVE Final    Comment:        The GeneXpert MRSA Assay (FDA approved for NASAL specimens only), is one component of a comprehensive MRSA colonization surveillance program. It is not intended to diagnose MRSA infection nor to guide or monitor treatment for MRSA infections.   Culture, body fluid-bottle     Status: None (Preliminary result)   Collection Time: 09/04/17  2:00 PM  Result Value Ref Range Status   Specimen Description PLEURAL  Final   Special Requests NONE  Final   Culture NO GROWTH < 24 HOURS  Final   Report Status PENDING  Incomplete  Gram stain     Status: None   Collection Time: 09/04/17   2:00 PM  Result Value Ref Range Status   Specimen Description PLEURAL  Final   Special Requests NONE  Final   Gram Stain   Final    WBC PRESENT, PREDOMINANTLY PMN NO ORGANISMS SEEN CYTOSPIN SMEAR    Report Status 09/04/2017 FINAL  Final         Radiology Studies: Dg Chest 1 View  Result Date: 09/04/2017 CLINICAL DATA:  Post right-sided thoracentesis EXAM: CHEST 1 VIEW COMPARISON:  09/03/2017; chest CT - 09/03/2017 FINDINGS: Grossly unchanged enlarged cardiac silhouette and mediastinal contours. Stable position of support apparatus. Interval reduction / near resolution of right-sided pleural effusion post thoracentesis. No pneumothorax. A small amount of fluid is seen tracking within the right major fissure. Overall improved aeration of the right lung base. Minimal bibasilar opacities favored to represent atelectasis. No new focal airspace opacities. No evidence of edema. No acute osseus abnormalities. Degenerative change of the left glenohumeral joint is suspected though incompletely evaluated. IMPRESSION: 1. Interval reduction / near resolution of right-sided pleural effusion post thoracentesis. No pneumothorax. 2. Improved aeration of lung bases with persistent bibasilar opacities, likely atelectasis. Electronically Signed   By: Sandi Mariscal M.D.   On: 09/04/2017 14:16   US Thoracentesis Asp Pleural Space W/img Guide  Result Date: 09/04/2017 INDICATION: Symptomatic right sided pleural effusion EXAM: US THORACENTESIS ASP PLEURAL SPACE W/IMG GUIDE COMPARISON:  Chest CT - 09/03/2017; chest radiograph - 09/03/2017; 07/24/2017 MEDICATIONS: None. COMPLICATIONS: None immediate. TECHNIQUE: Informed written consent was obtained from the patient after a discussion of the risks, benefits and alternatives to treatment. A timeout was performed prior to the initiation of the procedure. Initial ultrasound scanning demonstrates a large anechoic right-sided pleural effusion. The lower chest was prepped and  draped in the usual sterile fashion. 1% lidocaine was used for local anesthesia. An ultrasound image was saved for documentation purposes. An 8 Fr Safe-T-Centesis catheter was introduced. The thoracentesis was  performed. The catheter was removed and a dressing was applied. The patient tolerated the procedure well without immediate post procedural complication. The patient was escorted to have an upright chest radiograph. FINDINGS: A total of approximately 1.8 liters of serous fluid was removed. Requested samples were sent to the laboratory. IMPRESSION: Successful ultrasound-guided right sided thoracentesis yielding 1.8 liters of pleural fluid. Electronically Signed   By: Sandi Mariscal M.D.   On: 09/04/2017 14:16        Scheduled Meds: . colchicine  0.3 mg Oral Daily  . collagenase   Topical Daily  . feeding supplement (NEPRO CARB STEADY)  237 mL Oral BID BM  . insulin aspart  0-5 Units Subcutaneous QHS  . insulin aspart  0-9 Units Subcutaneous TID WC  . loratadine  10 mg Oral Daily  . midodrine  10 mg Oral TID WC  . multivitamin  1 tablet Oral QHS  . pantoprazole  40 mg Oral Daily  . sodium chloride flush  3 mL Intravenous Q12H  . sucroferric oxyhydroxide  500 mg Oral TID WC  . warfarin  1 mg Oral ONCE-1800  . Warfarin - Pharmacist Dosing Inpatient   Does not apply q1800   Continuous Infusions: . sodium chloride    . ceFEPime (MAXIPIME) IV    . vancomycin Stopped (09/05/17 1421)     LOS: 2 days    Time spent: 35 minutes.     Hosie Poisson, MD [facility] Pager [phone]   If 7PM-7AM, please contact night-coverage [email or web address] Password [email or web address] 09/05/2017, 5:27 PM

## 2017-09-05 NOTE — Progress Notes (Signed)
[facility] Progress Note  Subjective: no sig SOB, CP or cough, lying flat on dialysis.  Wants to go back to [facility] w/ Dr Hinda Lenis, "[name]'s the one that started me on dialysis".   Had thoracentesis yest 1.8 L   Vitals:   09/04/17 1500 09/04/17 1815 09/04/17 2144 09/05/17 0458  BP:  109/67 (!) 90/56 (!) 94/55  Pulse:  79 72 73  Resp: 18 20 19 18   Temp:  98.2 F (36.8 C) 97.7 F (36.5 C) 97.6 F (36.4 C)  TempSrc:  Oral Oral Oral  SpO2:  96%  98%  Weight:      Height:        Inpatient medications: . colchicine  0.3 mg Oral Daily  . collagenase   Topical Daily  . feeding supplement (NEPRO CARB STEADY)  237 mL Oral BID BM  . insulin aspart  0-5 Units Subcutaneous QHS  . insulin aspart  0-9 Units Subcutaneous TID WC  . loratadine  10 mg Oral Daily  . midodrine      . midodrine  10 mg Oral TID WC  . multivitamin  1 tablet Oral QHS  . pantoprazole  40 mg Oral Daily  . sodium chloride flush  3 mL Intravenous Q12H  . sucroferric oxyhydroxide  500 mg Oral TID WC  . Warfarin - Pharmacist Dosing Inpatient   Does not apply q1800   . sodium chloride    . sodium chloride    . sodium chloride    . ceFEPime (MAXIPIME) IV    . vancomycin     sodium chloride, sodium chloride, sodium chloride, albuterol, alteplase, heparin, HYDROcodone-acetaminophen, lidocaine (PF), lidocaine-prilocaine, nitroGLYCERIN, pentafluoroprop-tetrafluoroeth, polyethylene glycol, sodium chloride flush  Exam: Cachectic, pleasant thin male No jvd Chest faint bibasilar crackles RRR Abd soft , ntnd Ext erythematous, chronic appearance, trace edema NF, ox 3, gen'd weakness RUA AVF patent   CT chest - large R effusion, smaller L effusion, RML pna  Dialysis: [place]/ [facility]  MWF 4h   3K/2.5  77kg   Hep none   R AVF -mircera 50 ug q 2, to start 10/3 -venofer 50/wk     Impression: 1.  Cough /HCAP - per CT, on IV vanc/ maxipime 2.  ESRD - vol ok, no CHF. Will call [facility]  [name] 3.  Hypotension - chronic on midodrine 10 x3 4.  Anemia Hb 10, start esa on Wed 5.  MBD - labs ok , no vdra/ binder yet 6.  CHF / CM - EF 15-20%, hx BiV/ ICD 7.  Afib - on warfarin, paced rhythm  Plan - HD today, try lower dry wt a bit   Kelly Splinter MD [facility] pager [phone]   09/05/2017, 8:34 AM    Recent Labs Lab 09/03/17 0323 09/04/17 0256  NA 134* 133*  K 4.3 4.2  CL 96* 96*  CO2 25 21*  GLUCOSE 147* 109*  BUN 16 26*  CREATININE 2.76* 3.53*  CALCIUM 8.7* 8.8*    Recent Labs Lab 09/03/17 0323 09/04/17 0256  AST 53* 45*  ALT 14* 14*  ALKPHOS 116 98  BILITOT 3.2* 3.3*  PROT 6.7 6.7  ALBUMIN 2.3* 2.3*    Recent Labs Lab 09/03/17 0323 09/04/17 0256 09/05/17 0329  WBC 15.9* 10.5 8.5  NEUTROABS 14.3*  --   --   HGB 10.3* 10.7* 10.5*  HCT 32.5* 33.6* 33.3*  MCV 101.6* 102.4* 99.4  PLT 113* 90* 91*   Iron/TIBC/Ferritin/ %Sat    Component Value  Date/Time   IRON 22 (L) 07/25/2017 1144   TIBC 225 (L) 07/25/2017 1144   FERRITIN 238 07/12/2017 0424   IRONPCTSAT 10 (L) 07/25/2017 1144   IRONPCTSAT 17 02/24/2017 0915

## 2017-09-05 NOTE — Consult Note (Signed)
[place] Nurse wound consult note Reason for Consult: pressure injury heel and sacrum Wound type: Unstageable pressure injury right heel: 2cm x 2cm x 0cm  Arterial ulceration right great toe tip: 0.6cm x 1.2cm x 0cm  Trauma/arterial: right pretibial area: 2cm x 1cm x 0cm  Unstageable pressure injury: sacrum; 6cm x 5cm x 1.2cm  Pressure Injury POA: Yes Measurement:see above Wound bed: Right great toe and right heel stable 100% eschar Sacrum 100% soft yellow slough Drainage (amount, consistency, odor) none from the heel/toe. Moderate/heavy from the sacrum with foul odor related to amount of necrotic tissue Periwound: intact, hemosiderin staining bilateral LEs Dressing procedure/placement/frequency: Add PT for hydrotherapy for the sacral wound along with enzymatic debridement ointment daily Paint stable heel eschar and right great toe tip eschar with betadine and allow to air dry. Prevalon boot for offloading the right heel; to be used at all times while in bed.  Consult dietician for wound healing Leave right pretibial open to air, ok to use foam if needed to protect.  Chair pressure redistribution cushion for use when up in the chair.  [place] Nurse team will follow along with you for weekly wound assessments.  Please notify me of any acute changes in the wounds or any new areas of concerns [place] MSN, RN,CWOCN, [place], [place]

## 2017-09-05 NOTE — Evaluation (Signed)
Physical Therapy Evaluation Patient Details Name: [name] MRN: [id number] DOB: 08/12/1956 Today's Date: 09/05/2017   History of Present Illness   [name] is a 61 y.o. male with ESRD, HD initiated 07/2017 during recent [facility] admit with MSSA bacteremia/shock. PMH includes  HTN, Type 2 DM, Hx Rectal cancer (s/p colostomy 2008), Afib (on warfarin), CAD (s/p stents), ischemic CM (EF 15-20%, ICD in place), OSA on CPAP, cirrhosis, thrombocytopenia. Admitted with productive cough and hemoptysis; suspicious for pneumonia; order for PT Hydro to follow for sacral ulcer.  Clinical Impression   Pt admitted with above diagnosis. Pt currently with functional limitations due to the deficits listed below (see PT Problem List). Presents to PT with decr functional mobility, decr strength; Very motivated to improve, excellent participation; Noted he also has PT hydrotherapy needs, which can be addressed at CIR as well;  Pt will benefit from skilled PT to increase their independence and safety with mobility to allow discharge to the venue listed below.       Follow Up Recommendations CIR    Equipment Recommendations  None recommended by PT    Recommendations for Other Services OT consult, Rehab Consult    Precautions / Restrictions Precautions Precautions: Fall      Mobility  Bed Mobility Overal bed mobility: Needs Assistance Bed Mobility: Rolling;Sidelying to Sit Rolling: Min assist Sidelying to sit: Mod assist       General bed mobility comments: Light mod assist to elevate trunk to sit  Transfers Overall transfer level: Needs assistance Equipment used: Rolling walker (2 wheeled) Transfers: Sit to/from Stand Sit to Stand: Min assist         General transfer comment: Min assist to steady; cues for hand lacement and safety  Ambulation/Gait Ambulation/Gait assistance: Min guard Ambulation Distance (Feet): 50 Feet Assistive device: Rolling walker (2 wheeled) Gait  Pattern/deviations: Step-through pattern     General Gait Details: Cues to self-monitor for activity tolerance  Stairs            Wheelchair Mobility    Modified Rankin (Stroke Patients Only)       Balance Overall balance assessment: Needs assistance   Sitting balance-Leahy Scale: Good       Standing balance-Leahy Scale: Poor (approaching Fair)                               Pertinent Vitals/Pain Pain Assessment: 0-10 Pain Score: 8  Pain Location: sacrum Pain Descriptors / Indicators: Aching Pain Intervention(s): Monitored during session;Patient requesting pain meds-RN notified    Home Living Family/patient expects to be discharged to:: Private residence Living Arrangements: Spouse/significant other Available Help at Discharge: Family;Available 24 hours/day Type of Home: House Home Access: Stairs to enter Entrance Stairs-Rails: None Entrance Stairs-Number of Steps: 1 Home Layout: One level Home Equipment: Walker - 4 wheels;Walker - standard;Tub bench (may be able to find more of "Mom's" equipment)      Prior Function Level of Independence: Independent               Hand Dominance   Dominant Hand: Right    Extremity/Trunk Assessment   Upper Extremity Assessment Upper Extremity Assessment: Generalized weakness    Lower Extremity Assessment Lower Extremity Assessment: Generalized weakness    Cervical / Trunk Assessment Cervical / Trunk Assessment:  (Sacral ulcer)  Communication   Communication: No difficulties  Cognition Arousal/Alertness: Awake/alert Behavior During Therapy: WFL for tasks assessed/performed Overall Cognitive Status: Within  Functional Limits for tasks assessed                                        General Comments      Exercises     Assessment/Plan    PT Assessment Patient needs continued PT services  PT Problem List Decreased strength;Decreased activity tolerance;Decreased  balance;Decreased mobility;Decreased coordination;Decreased cognition;Decreased knowledge of use of DME;Decreased safety awareness;Decreased knowledge of precautions       PT Treatment Interventions DME instruction;Gait training;Stair training;Functional mobility training;Therapeutic activities;Therapeutic exercise;Balance training;Patient/family education    PT Goals (Current goals can be found in the Care Plan section)  Acute Rehab PT Goals Patient Stated Goal: get better PT Goal Formulation: With patient Time For Goal Achievement: 09/19/17 Potential to Achieve Goals: Good    Frequency Min 3X/week   Barriers to discharge        Co-evaluation               AM-PAC PT "6 Clicks" Daily Activity  Outcome Measure Difficulty turning over in bed (including adjusting bedclothes, sheets and blankets)?: A Little Difficulty moving from lying on back to sitting on the side of the bed? : A Little Difficulty sitting down on and standing up from a chair with arms (e.g., wheelchair, bedside commode, etc,.)?: A Little Help needed moving to and from a bed to chair (including a wheelchair)?: A Little Help needed walking in hospital room?: A Little Help needed climbing 3-5 steps with a railing? : A Little 6 Click Score: 18    End of Session Equipment Utilized During Treatment: Gait belt Activity Tolerance: Patient tolerated treatment well Patient left: in bed;with call bell/phone within reach;Other (comment) (sitting EOB) Nurse Communication: Mobility status PT Visit Diagnosis: Unsteadiness on feet (R26.81);Muscle weakness (generalized) (M62.81);Other abnormalities of gait and mobility (R26.89);Pain Pain - part of body:  (Sacrum)    Time: 1355-1420 PT Time Calculation (min) (ACUTE ONLY): 25 min   Charges:   PT Evaluation $PT Eval Moderate Complexity: 1 Mod PT Treatments $Gait Training: 8-22 mins   PT G Codes:        Roney Marion, PT  Acute Rehabilitation Services Pager  [phone] Office [phone]   [place] 09/05/2017, 2:48 PM

## 2017-09-05 NOTE — Progress Notes (Signed)
ANTICOAGULATION CONSULT NOTE  Pharmacy Consult for warfarin Indication: atrial fibrillation  No Known Allergies  Patient Measurements: Height: 5\' 10"  (177.8 cm) Weight: 160 lb (72.6 kg) IBW/kg (Calculated) : 73  Vital Signs: Temp: (P) 98.2 F (36.8 C) (10/01 1158) Temp Source: Oral (10/01 1158) BP: (P) 94/45 (10/01 1158) Pulse Rate: (P) 69 (10/01 1158)  Labs:  Recent Labs  09/03/17 0323 09/03/17 0856 09/03/17 1240 09/03/17 1829 09/04/17 0256 09/05/17 0329 09/05/17 0800  HGB 10.3*  --   --   --  10.7* 10.5*  --   HCT 32.5*  --   --   --  33.6* 33.3*  --   PLT 113*  --   --   --  90* 91*  --   LABPROT 25.7*  --   --   --  24.9* 25.1*  --   INR 2.37  --   --   --  2.28 2.30  --   CREATININE 2.76*  --   --   --  3.53*  --  2.62*  TROPONINI  --  0.12* 0.05* 0.06*  --   --   --     Estimated Creatinine Clearance: 30 mL/min (A) (by C-G formula based on SCr of 2.62 mg/dL (H)).   Assessment: 45 YOM with AFib on warfarin. PTA the patient had INR checked on 9/28 which resulted at 1.8 and dose was changed to 1mg  daily except 2mg  on Fridays (previously 1mg  every day). Took warfarin 2mg  on 9/28.  He was admitted to [facility] on 09/03/17 for pneumonia, HCAP.  The INR on admission 9/29 was 2.37 therapeutic.   INR 2.3 this morning. INR is therapeutic.   Hgb 10.5 low/stable, plts 91 low/stable- no bleeding noted.  Goal of Therapy:  INR 2-3 Monitor platelets by anticoagulation protocol: Yes   Plan:  Warfarin 1mg  po x1 tonight Daily INR Follow for s/s bleeding  Nicole Cella, RPh Clinical Pharmacist Pager: [place] Phone for 09/05/2017 until 3:30pm: [phone] If after 3:30pm, please call [phone] or  main pharmacy at [phone] 09/05/2017 12:26 PM

## 2017-09-05 NOTE — Progress Notes (Signed)
PT Cancellation Note  Patient Details Name: [name] MRN: [id number] DOB: [birth year]   Cancelled Treatment:    Reason Eval/Treat Not Completed: Patient at procedure or test/unavailable   Currently in HD;  Will follow up later today as time allows;  Otherwise, will follow up for PT tomorrow;   Thank you,  Roney Marion, PT  Acute Rehabilitation Services Pager [phone] Office [phone]     [name] 09/05/2017, 10:01 AM

## 2017-09-05 NOTE — Progress Notes (Signed)
Inpatient Rehabilitation  Per, PT request, patient was screened by Gunnar Fusi for appropriateness for an Inpatient Acute Rehab consult.  At this time we are recommending an Inpatient Rehab consult.  MD text paged; please order if you are agreeable.    Carmelia Roller., CCC/SLP Admission Coordinator  [place]  Cell [phone]

## 2017-09-05 NOTE — Progress Notes (Signed)
[facility]  Patient Status: Active (receiving services up to time of hospitalization)  [facility] is providing the following services: RN, PT and OT  If patient discharges after hours, please call [phone].   [name] 09/05/2017, 3:57 PM

## 2017-09-06 ENCOUNTER — Encounter: Payer: Self-pay | Admitting: Cardiology

## 2017-09-06 ENCOUNTER — Inpatient Hospital Stay (HOSPITAL_COMMUNITY): Payer: Medicare Other

## 2017-09-06 DIAGNOSIS — Z992 Dependence on renal dialysis: Secondary | ICD-10-CM

## 2017-09-06 DIAGNOSIS — E1129 Type 2 diabetes mellitus with other diabetic kidney complication: Secondary | ICD-10-CM

## 2017-09-06 DIAGNOSIS — E1165 Type 2 diabetes mellitus with hyperglycemia: Secondary | ICD-10-CM

## 2017-09-06 DIAGNOSIS — Z933 Colostomy status: Secondary | ICD-10-CM

## 2017-09-06 DIAGNOSIS — D638 Anemia in other chronic diseases classified elsewhere: Secondary | ICD-10-CM

## 2017-09-06 DIAGNOSIS — J189 Pneumonia, unspecified organism: Principal | ICD-10-CM

## 2017-09-06 DIAGNOSIS — D696 Thrombocytopenia, unspecified: Secondary | ICD-10-CM

## 2017-09-06 DIAGNOSIS — K7460 Unspecified cirrhosis of liver: Secondary | ICD-10-CM

## 2017-09-06 DIAGNOSIS — Z9889 Other specified postprocedural states: Secondary | ICD-10-CM

## 2017-09-06 DIAGNOSIS — E440 Moderate protein-calorie malnutrition: Secondary | ICD-10-CM | POA: Insufficient documentation

## 2017-09-06 DIAGNOSIS — N186 End stage renal disease: Secondary | ICD-10-CM

## 2017-09-06 LAB — GLUCOSE, CAPILLARY
GLUCOSE-CAPILLARY: 155 mg/dL — AB (ref 65–99)
GLUCOSE-CAPILLARY: 169 mg/dL — AB (ref 65–99)
GLUCOSE-CAPILLARY: 137 mg/dL — AB (ref 65–99)
Glucose-Capillary: 157 mg/dL — ABNORMAL HIGH (ref 65–99)

## 2017-09-06 LAB — CBC
HCT: 34.3 % — ABNORMAL LOW (ref 39.0–52.0)
Hemoglobin: 10.8 g/dL — ABNORMAL LOW (ref 13.0–17.0)
MCH: 31.7 pg (ref 26.0–34.0)
MCHC: 31.5 g/dL (ref 30.0–36.0)
MCV: 100.6 fL — AB (ref 78.0–100.0)
PLATELETS: 83 10*3/uL — AB (ref 150–400)
RBC: 3.41 MIL/uL — AB (ref 4.22–5.81)
RDW: 22 % — AB (ref 11.5–15.5)
WBC: 7.4 10*3/uL (ref 4.0–10.5)

## 2017-09-06 LAB — PROTIME-INR
INR: 2.28
Prothrombin Time: 25 seconds — ABNORMAL HIGH (ref 11.4–15.2)

## 2017-09-06 LAB — VITAMIN B12: VITAMIN B 12: 1296 pg/mL — AB (ref 180–914)

## 2017-09-06 MED ORDER — PRO-STAT SUGAR FREE PO LIQD
30.0000 mL | Freq: Two times a day (BID) | ORAL | Status: DC
  Administered 2017-09-06 – 2017-09-13 (×11): 30 mL via ORAL
  Filled 2017-09-06 (×11): qty 30

## 2017-09-06 MED ORDER — WARFARIN SODIUM 1 MG PO TABS
1.0000 mg | ORAL_TABLET | Freq: Once | ORAL | Status: AC
  Administered 2017-09-06: 1 mg via ORAL
  Filled 2017-09-06: qty 1

## 2017-09-06 MED ORDER — MORPHINE SULFATE (PF) 2 MG/ML IV SOLN
1.0000 mg | INTRAVENOUS | Status: DC | PRN
  Administered 2017-09-07 – 2017-09-13 (×6): 2 mg via INTRAVENOUS
  Filled 2017-09-06 (×7): qty 1

## 2017-09-06 MED ORDER — DARBEPOETIN ALFA 60 MCG/0.3ML IJ SOSY
60.0000 ug | PREFILLED_SYRINGE | INTRAMUSCULAR | Status: DC
  Administered 2017-09-07 – 2017-09-14 (×2): 60 ug via INTRAVENOUS
  Filled 2017-09-06: qty 0.3

## 2017-09-06 NOTE — Progress Notes (Signed)
Pharmacy Antibiotic Note  [name] is a 61 y.o. male admitted on 09/03/2017 with pneumonia.  Pharmacy  Consulted on 09/03/17 for Vancomycin/Cefepime dosing. Pt has ESRD on HD MWF, WBC previously, now down to wnl . Afebrile. Cultures no growth to date.  ESRD on HD MWF,  HD x4h, BFR 300 done 10/1  Day # 4 Vanc/cefepime schedule QMWF post HD    Antimicrobials this admission: Vanc 9/29>> Cefepime 9/29>>   Microbiology:  9/29 BCx: ngtd 9/30 Sputum (pleura) gram stain: no organisms seen/final 9/30 bldy fluid Cx: no result 9/29 resp panel: neg 9/29 MRSA PCR: neg   Plan: Vancomycin 750 mg IV qHD MWF Cefepime 2g IV at 2000 on MWF Trend WBC, temp, HD schedule  F/U infectious work-up Pre HD Vancomycin levels as indicated    Height: 5\' 10"  (177.8 cm) Weight: 154 lb 5.2 oz (70 kg) IBW/kg (Calculated) : 73  Temp (24hrs), Avg:97.9 F (36.6 C), Min:97.8 F (36.6 C), Max:98 F (36.7 C)   Recent Labs Lab 09/03/17 0323 09/04/17 0256 09/05/17 0329 09/05/17 0800 09/06/17 0353  WBC 15.9* 10.5 8.5  --  7.4  CREATININE 2.76* 3.53*  --  2.62*  --     Estimated Creatinine Clearance: 28.9 mL/min (A) (by C-G formula based on SCr of 2.62 mg/dL (H)).    No Known Allergies  Nicole Cella, RPh Clinical Pharmacist Pager: [phone]-[phone] [phone] phone [phone] or [phone] Main pharmacy [phone]  09/06/2017 4:14 PM

## 2017-09-06 NOTE — Progress Notes (Signed)
Dr. Karleen Hampshire notified pt with acute onset of confusion/delerium upon awakening from a long nap. A&Ox4, however, saying off the wall things, not making sense at times. Pt states he's not sure if he had a bad dream. Neuro intact. VSS. Charge RN notified and to bedside to also assess. MD to come see pt. Will continue to monitor.

## 2017-09-06 NOTE — Progress Notes (Signed)
PROGRESS NOTE    SHION BLUESTEIN  HYI:[id number] DOB: 1956-11-18 DOA: 09/03/2017 PCP: Redmond School, MD   Brief Narrative:  TommyAshleyis a 61 y.o.male,w Dm2, SAH, Pafib, Mild MR, Mild AR, CAD, CHF (EF 20%) ESRD on HD (M,W, F), apparently c/o cough  with yellow sputum and mild hemoptysis. He was found to have health care associated pneumonia. Admitted for the management of hcap.   Assessment & Plan:   Principal Problem:   HCAP (healthcare-associated pneumonia) Active Problems:   DM type 2, uncontrolled, with renal complications (HCC)   Abnormal LFTs   Hyponatremia   Hemoptysis   Status post thoracentesis   Colostomy in place [facility])   ESRD on dialysis ([place])   Malnutrition of moderate degree   Health care associated pneumonia/ pleural effusion possibly para pneumonic effusion: Admitted for IV antibiotics, vancomycin and cefepime.  US guided thoracentesis on the right, yielding 1.8 liters of serous pleural fluid. Fluid sent for analysis, so far gram stain is negative and cultures are pending, negative so far Sputum cultures not sent and blood cultures are negative so far.  Influenza PCR is negative.  Repeat CXR shows worsening of air space disease on the right side, pulm edema vs aspiration pneumonia.  Will get SLP evaluation.      Diabetes mellitus:  CBG (last 3)   Recent Labs  09/06/17 0813 09/06/17 1234 09/06/17 1654  GLUCAP 155* 157* 169*    Resume SSI. No change in medications.     Hemoptysis: possibly from the bronchitis and coughing and hcap. Improved.   ESRD on HD:  Further management as per renal.     H/o chronic thrombocytopenia No signs of hemoptysis today. No signs of bleeding. Platelets stable around 91,000. Monitor in am.    Leukocytosis resolved. Remains afebrile.    Hyponatremia from ESRD.    Macrocytic anemia:  Adequate folate and vit b12 levels.  Hemoglobin stable around 10.    Sacral decubitus ulcer; unstage  able. Wound care consulted for recommendations.  Undergoing hydrotherapy.   DVT prophylaxis: scd's Code Status: full code.  Family Communication: none at bedside, discussed the plan with the patient.  Disposition Plan: home in 1 to 2 days.    Consultants:   Wound care.   Procedures:US guided thoracentesis.  Antimicrobials: vancomycin and cefepime since admission.   Subjective: delirious earlier, resolved. Alert and oriented.   Objective: Vitals:   09/05/17 1755 09/05/17 2137 09/06/17 0459 09/06/17 0943  BP: (!) 103/57 (!) 92/53 (!) 97/59 (!) 93/51  Pulse: 72 74 74 71  Resp: 16 16 16 16   Temp: 98 F (36.7 C) 98 F (36.7 C) 97.9 F (36.6 C) 97.8 F (36.6 C)  TempSrc: Oral Oral Oral Oral  SpO2: 98% 94% 99% 100%  Weight:      Height:        Intake/Output Summary (Last 24 hours) at 09/06/17 1658 Last data filed at 09/06/17 1400  Gross per 24 hour  Intake              660 ml  Output              200 ml  Net              460 ml   Filed Weights   09/05/17 0738 09/05/17 0955 09/05/17 1158  Weight: 72.7 kg (160 lb 4.4 oz) 72.6 kg (160 lb) 70 kg (154 lb 5.2 oz)    Examination:  General exam: Appears calm and comfortable off oxygen.  Respiratory system: good air entry, diminished at bases, no wheezing or rhonchi.  Cardiovascular system: S1 S2, no JVD, no murmer. RRR.Marland Kitchen Gastrointestinal system: Abdomen is soft non tender non distended bowel sounds heard, colostomy in place. . Central nervous system: alert and answering questions appropriately.  Extremities:no pedal edema. No cyanosis or clubbing.  Skin: sacral decubitus ulcer.  Psychiatry: Mood & affect appropriate.     Data Reviewed: I have personally reviewed following labs and imaging studies  CBC:  Recent Labs Lab 09/03/17 0323 09/04/17 0256 09/05/17 0329 09/06/17 0353  WBC 15.9* 10.5 8.5 7.4  NEUTROABS 14.3*  --   --   --   HGB 10.3* 10.7* 10.5* 10.8*  HCT 32.5* 33.6* 33.3* 34.3*  MCV 101.6* 102.4*  99.4 100.6*  PLT 113* 90* 91* 83*   Basic Metabolic Panel:  Recent Labs Lab 09/03/17 0323 09/04/17 0256 09/05/17 0800  NA 134* 133* 130*  K 4.3 4.2 3.6  CL 96* 96* 93*  CO2 25 21* 24  GLUCOSE 147* 109* 112*  BUN 16 26* 22*  CREATININE 2.76* 3.53* 2.62*  CALCIUM 8.7* 8.8* 8.1*  PHOS  --   --  2.5   GFR: Estimated Creatinine Clearance: 28.9 mL/min (A) (by C-G formula based on SCr of 2.62 mg/dL (H)). Liver Function Tests:  Recent Labs Lab 09/03/17 0323 09/04/17 0256 09/05/17 0800  AST 53* 45*  --   ALT 14* 14*  --   ALKPHOS 116 98  --   BILITOT 3.2* 3.3*  --   PROT 6.7 6.7  --   ALBUMIN 2.3* 2.3* 2.3*   No results for input(s): LIPASE, AMYLASE in the last 168 hours. No results for input(s): AMMONIA in the last 168 hours. Coagulation Profile:  Recent Labs Lab 09/02/17 09/03/17 0323 09/04/17 0256 09/05/17 0329 09/06/17 0353  INR 1.8 2.37 2.28 2.30 2.28   Cardiac Enzymes:  Recent Labs Lab 09/03/17 0856 09/03/17 1240 09/03/17 1829  TROPONINI 0.12* 0.05* 0.06*   BNP (last 3 results) No results for input(s): PROBNP in the last 8760 hours. HbA1C: No results for input(s): HGBA1C in the last 72 hours. CBG:  Recent Labs Lab 09/05/17 1636 09/05/17 2127 09/06/17 0813 09/06/17 1234 09/06/17 1654  GLUCAP 133* 153* 155* 157* 169*   Lipid Profile: No results for input(s): CHOL, HDL, LDLCALC, TRIG, CHOLHDL, LDLDIRECT in the last 72 hours. Thyroid Function Tests: No results for input(s): TSH, T4TOTAL, FREET4, T3FREE, THYROIDAB in the last 72 hours. Anemia Panel:  Recent Labs  09/06/17 0353  VITAMINB12 1,296*   Sepsis Labs: No results for input(s): PROCALCITON, LATICACIDVEN in the last 168 hours.  Recent Results (from the past 240 hour(s))  Culture, blood (routine x 2) Call MD if unable to obtain prior to antibiotics being given     Status: None (Preliminary result)   Collection Time: 09/03/17  8:56 AM  Result Value Ref Range Status   Specimen  Description BLOOD BLOOD LEFT HAND  Final   Special Requests IN PEDIATRIC BOTTLE Blood Culture adequate volume  Final   Culture NO GROWTH 3 DAYS  Final   Report Status PENDING  Incomplete  Culture, blood (routine x 2) Call MD if unable to obtain prior to antibiotics being given     Status: None (Preliminary result)   Collection Time: 09/03/17  9:00 AM  Result Value Ref Range Status   Specimen Description BLOOD BLOOD LEFT HAND  Final   Special Requests IN PEDIATRIC BOTTLE Blood Culture adequate volume  Final   Culture  NO GROWTH 3 DAYS  Final   Report Status PENDING  Incomplete  Respiratory Panel by PCR     Status: None   Collection Time: 09/03/17  5:32 PM  Result Value Ref Range Status   Adenovirus NOT DETECTED NOT DETECTED Final   Coronavirus 229E NOT DETECTED NOT DETECTED Final   Coronavirus HKU1 NOT DETECTED NOT DETECTED Final   Coronavirus NL63 NOT DETECTED NOT DETECTED Final   Coronavirus OC43 NOT DETECTED NOT DETECTED Final   Metapneumovirus NOT DETECTED NOT DETECTED Final   Rhinovirus / Enterovirus NOT DETECTED NOT DETECTED Final   Influenza A NOT DETECTED NOT DETECTED Final   Influenza B NOT DETECTED NOT DETECTED Final   Parainfluenza Virus 1 NOT DETECTED NOT DETECTED Final   Parainfluenza Virus 2 NOT DETECTED NOT DETECTED Final   Parainfluenza Virus 3 NOT DETECTED NOT DETECTED Final   Parainfluenza Virus 4 NOT DETECTED NOT DETECTED Final   Respiratory Syncytial Virus NOT DETECTED NOT DETECTED Final   Bordetella pertussis NOT DETECTED NOT DETECTED Final   Chlamydophila pneumoniae NOT DETECTED NOT DETECTED Final   Mycoplasma pneumoniae NOT DETECTED NOT DETECTED Final  MRSA PCR Screening     Status: None   Collection Time: 09/03/17  5:32 PM  Result Value Ref Range Status   MRSA by PCR NEGATIVE NEGATIVE Final    Comment:        The GeneXpert MRSA Assay (FDA approved for NASAL specimens only), is one component of a comprehensive MRSA colonization surveillance program. It is  not intended to diagnose MRSA infection nor to guide or monitor treatment for MRSA infections.   Culture, body fluid-bottle     Status: None (Preliminary result)   Collection Time: 09/04/17  2:00 PM  Result Value Ref Range Status   Specimen Description PLEURAL  Final   Special Requests NONE  Final   Culture NO GROWTH 2 DAYS  Final   Report Status PENDING  Incomplete  Gram stain     Status: None   Collection Time: 09/04/17  2:00 PM  Result Value Ref Range Status   Specimen Description PLEURAL  Final   Special Requests NONE  Final   Gram Stain   Final    WBC PRESENT, PREDOMINANTLY PMN NO ORGANISMS SEEN CYTOSPIN SMEAR    Report Status 09/04/2017 FINAL  Final         Radiology Studies: Dg Chest 2 View  Result Date: 09/06/2017 CLINICAL DATA:  61 year old male with bloody sputum and possible infection. Shortness breath. Subsequent encounter. EXAM: CHEST  2 VIEW COMPARISON:  09/04/2017 chest x-ray.  09/03/2017 chest CT. FINDINGS: AICD/biventricular pacer in place.  Cardiomegaly. Appearance of worsening airspace disease right lung may represent progressive mild pulmonary edema versus shifting of posteriorly layering pleural effusion. Right middle lobe consolidation as noted on recent CT. IMPRESSION: Appearance of worsening airspace disease right lung may represent progressive mild pulmonary edema versus shifting of posteriorly layering pleural effusion. Right middle lobe consolidation as noted on recent CT. Cardiomegaly. Electronically Signed   By: Genia Del M.D.   On: 09/06/2017 14:30        Scheduled Meds: . colchicine  0.3 mg Oral Daily  . collagenase   Topical Daily  . [START ON 09/07/2017] darbepoetin (ARANESP) injection - DIALYSIS  60 mcg Intravenous Q Wed-HD  . feeding supplement (NEPRO CARB STEADY)  237 mL Oral BID BM  . feeding supplement (PRO-STAT SUGAR FREE 64)  30 mL Oral BID WC  . insulin aspart  0-5 Units Subcutaneous  QHS  . insulin aspart  0-9 Units  Subcutaneous TID WC  . loratadine  10 mg Oral Daily  . midodrine  10 mg Oral TID WC  . multivitamin  1 tablet Oral QHS  . pantoprazole  40 mg Oral Daily  . sodium chloride flush  3 mL Intravenous Q12H  . sucroferric oxyhydroxide  500 mg Oral TID WC  . warfarin  1 mg Oral ONCE-1800  . Warfarin - Pharmacist Dosing Inpatient   Does not apply q1800   Continuous Infusions: . sodium chloride    . ceFEPime (MAXIPIME) IV Stopped (09/05/17 2045)  . vancomycin Stopped (09/05/17 1421)     LOS: 3 days    Time spent: 35 minutes.     Hosie Poisson, MD [facility] Pager [phone]   If 7PM-7AM, please contact night-coverage [email or web address] Password [email or web address] 09/06/2017, 4:58 PM

## 2017-09-06 NOTE — Care Management Note (Signed)
Case Management Note  Patient Details  Name: [name] MRN: [id number] Date of Birth: 1956-09-24  Subjective/Objective:       CM following for progression and d/c planning.              Action/Plan: 09/06/2017 Dr requesting outpatient hydrotherapy for this pt. Noted that pt lives in [place], call placed to [facility] re: outpatient services. Unclear if this pt could travel to outpatient hydrotherapy.  Will continue to follow, MD will discuss with pt wife, pt does not want at go to SNF.   Expected Discharge Date:                  Expected Discharge Plan:  [place]  In-House Referral:  NA  Discharge planning Services  CM Consult  Post Acute Care Choice:    Choice offered to:     DME Arranged:    DME Agency:     HH Arranged:    HH Agency:     Status of Service:  In process, will continue to follow  If discussed at Long Length of Stay Meetings, dates discussed:    Additional Comments:  Adron Bene, RN 09/06/2017, 4:09 PM

## 2017-09-06 NOTE — Consult Note (Signed)
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 Patient received inpatient rehabilitation services 08/02/2017 until 08/20/2017 due to severe debilitation and encephalopathy secondary to respiratory failure. He was discharged to home at supervision level using a walker and assistance from his wife. Presented 09/03/2017 with cough as well as yellow sputum production. WBC 15,900. Denied chest pain or shortness of breath. Chest x-ray showed hazy densities at the right lung base suspicious for pneumonia. Placed on broad-spectrum antibiotics. INR on admission of 2.37. CT of the chest showed moderate to large right pleural effusion and moderate left pleural effusion. Underwent right thoracentesis with 1.8 liters of pleural fluid removed and follow-up chest x-ray showed interval reduction or resolution of right sided pleural effusion as well as improved aeration of lung bases. WOC follow-up 09/05/2017 for pressure injury to heel and sacrum with skin care as directed and await planned for hydrotherapy to the sacral ulcer. Hemodialysis ongoing as per renal services. Physical therapy evaluation completed 09/05/2017 with recommendations of physical medicine rehabilitation consult.   Review of Systems  Constitutional: Negative for chills and fever.  HENT: Negative for hearing loss.   Eyes: Negative for blurred vision and double vision.  Respiratory: Positive for cough and shortness of breath.   Cardiovascular: Negative for chest pain.  Gastrointestinal: Positive for constipation. Negative for nausea and vomiting.  Genitourinary: Negative for hematuria.  Skin: Negative for rash.  Neurological:  Positive for dizziness. Negative for seizures.  All other systems reviewed and are negative.  Past Medical History:  Diagnosis Date  . Adenocarcinoma of rectum ([place])    a. 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 AICD (automatic cardioverter/defibrillator) present 2002   BI V ICD  . Anemia   . CAD (coronary artery disease)    a. BMS to LAD 2001 at [facility] b. PTCA/atherectomy ramus and BMS to LAD 2009  . CHF (congestive heart failure) ([place])   . Cholelithiasis 06/2015  . Chronic kidney disease, stage IV (severe) ([place])   . Chronic systolic heart failure ([place])   . Cirrhosis ([place])   . Colostomy in place [facility])   . Dizziness    a. chronic. Admission for this 07/18/2014  . DM type 2, uncontrolled, with renal complications ([place])   . Dysrhythmia   . Essential hypertension, benign   . GERD (gastroesophageal reflux disease)   . HCAP (healthcare-associated pneumonia) 07/21/2015  . Hematuria   . History of blood transfusion    "I've had 2 units so far this year" (09/27/2015)  . HLD (hyperlipidemia)   . Ischemic cardiomyopathy    EF 18% by nuclear study 2016, multiple myocardial infarctions in past    . Myocardial infarction ([place]) 2001  . Obesity   . Orthostatic hypotension   . OSA on CPAP   . Paroxysmal atrial fibrillation ([facility])    a. on amiodarone, digoxin and Eliquis  . PONV (postoperative nausea and vomiting)   . Presence of permanent cardiac pacemaker   . Prostate cancer ([place])    a. s/p seed implants with chemo and radiation  . SAH (subarachnoid hemorrhage) ([place])    post-traumatic (fall) [facility] 12/2014  . TIA (transient ischemic attack)    Past Surgical History:  Procedure Laterality Date  . Abdominal and Perineal Resection of Rectum with  Total Mesorectal Excision  10/04/2007  . AV FISTULA PLACEMENT Right 09/16/2015   Procedure: ARTERIOVENOUS (AV) FISTULA CREATION - BRACHIOCEPHALIC;  Surgeon: Elam Dutch, MD;  Location: [place];  Service: Vascular;  Laterality: Right;  . BI-VENTRICULAR IMPLANTABLE  CARDIOVERTER DEFIBRILLATOR  (CRT-D)  2009  . CARDIAC CATHETERIZATION  08/2001; 2009   ; [name] 07/10/2013  . CARDIAC DEFIBRILLATOR PLACEMENT  2002  . CARDIAC DEFIBRILLATOR PLACEMENT  2009   Upgraded to a BiV ICD  . COLONOSCOPY  09/14/2011   Dr. Gala Romney: via colostomy, Single pedunculated benign inflammatory polyp. Due for surveillance Oct 2015  . COLONOSCOPY N/A 07/02/2014   Procedure: COLONOSCOPY;  Surgeon: Daneil Dolin, MD;  [facility];  Service: Endoscopy;  Laterality: N/A;  7:30 / COLONOSCOPY THRU COLOSTOMY  . COLONOSCOPY N/A 12/11/2014   Dr. Gala Romney via colostomy. Normal. Repeat in 2021.   Marland Kitchen COLONOSCOPY N/A 08/24/2015   Dr. Havery Moros: diminutive polyp not removed, stoma site of bleeding  . COLOSTOMY  2008  . CORONARY ANGIOPLASTY WITH STENT PLACEMENT  2001; ~ 2006   "1 + 1"   . ELECTROPHYSIOLOGIC STUDY N/A 08/28/2015   Procedure: AV Node Ablation;  Surgeon: Will Meredith Leeds, MD;  Location: [place] CV LAB;  Service: Cardiovascular;  Laterality: N/A;  . EP IMPLANTABLE DEVICE N/A 04/10/2015   Procedure: Ppm/Biv Ppm Generator Changeout;  Surgeon: Evans Lance, MD;  Location: [place] INVASIVE CV LAB CUPID;  Service: Cardiovascular;  Laterality: N/A;  . ERCP N/A 06/11/2016   Procedure: ENDOSCOPIC RETROGRADE CHOLANGIOPANCREATOGRAPHY (ERCP);  Surgeon: Teena Irani, MD;  Location: [facility] ENDOSCOPY;  Service: Endoscopy;  Laterality: N/A;  . ERCP N/A 01/26/2017   Procedure: ENDOSCOPIC RETROGRADE CHOLANGIOPANCREATOGRAPHY (ERCP);  Surgeon: Teena Irani, MD;  Location: [facility] ENDOSCOPY;  Service: Endoscopy;  Laterality: N/A;  . ERCP  06/2016   Dr. Amedeo Plenty: duodenal fistula, dilated bile duct but no obvious stones, s/p sphincterotomy and stent placement  . ERCP  01/2017   Dr. Amedeo Plenty: CBD stones, s/p sphincterotomy, balloon extraction, stent removal  . ESOPHAGOGASTRODUODENOSCOPY N/A 07/02/2014   Procedure: ESOPHAGOGASTRODUODENOSCOPY (EGD);  Surgeon: Daneil Dolin, MD;  [facility];  Service:  Endoscopy;  Laterality: N/A;  7:30  . ESOPHAGOGASTRODUODENOSCOPY N/A 12/11/2014   XTK:WIOXBD EGD  . ESOPHAGOGASTRODUODENOSCOPY (EGD) WITH PROPOFOL N/A 04/18/2017   Procedure: ESOPHAGOGASTRODUODENOSCOPY (EGD) WITH PROPOFOL;  Surgeon: Daneil Dolin, MD;  [facility];  Service: Endoscopy;  Laterality: N/A;  2:30pm  . GASTROINTESTINAL STENT REMOVAL N/A 01/26/2017   Procedure: GASTROINTESTINAL STENT REMOVAL;  Surgeon: Teena Irani, MD;  Location: [place];  Service: Endoscopy;  Laterality: N/A;  . GIVENS CAPSULE STUDY N/A 07/23/2015   Dr. Michail Sermon: small non-bleeding AVM, mild gastritis  . IR FLUORO GUIDE CV LINE RIGHT  07/27/2017  . IR REMOVAL TUN CV CATH W/O FL  08/12/2017  . IR US GUIDE VASC ACCESS RIGHT  07/27/2017  . LEFT HEART CATHETERIZATION WITH CORONARY ANGIOGRAM N/A 07/13/2013   Procedure: LEFT HEART CATHETERIZATION WITH CORONARY ANGIOGRAM;  Surgeon: Lorretta Harp, MD;  Location: [facility] CATH LAB;  Service: Cardiovascular;  Laterality: N/A;  . Venia Minks DILATION N/A 07/02/2014   Procedure: Venia Minks DILATION;  Surgeon: Daneil Dolin, MD;  [facility];  Service: Endoscopy;  Laterality: N/A;  7:30  . MALONEY DILATION N/A 04/18/2017   Procedure: Venia Minks DILATION;  Surgeon: Daneil Dolin, MD;  [facility];  Service: Endoscopy;  Laterality: N/A;  . PORTACATH PLACEMENT  06/2007   "removed ~ 1 yr  later"  . RIGHT HEART CATHETERIZATION N/A 02/24/2015   Procedure: RIGHT HEART CATH;  Surgeon: Jolaine Artist, MD;  Location: [facility] CATH LAB;  Service: Cardiovascular;  Laterality: N/A;  . SAVORY DILATION N/A 07/02/2014   Procedure: SAVORY DILATION;  Surgeon: Daneil Dolin, MD;  [facility];  Service: Endoscopy;  Laterality: N/A;  7:30   Family History  Problem Relation Age of Onset  . Colon cancer Mother 48  . Coronary artery disease Father   . Colon cancer Sister 71  . Diabetes Sister   . Colon cancer Other        2 cousins, succumbed to illness   Social  History:  reports that he has never smoked. He has never used smokeless tobacco. He reports that he does not drink alcohol or use drugs. Allergies: No Known Allergies Medications Prior to Admission  Medication Sig Dispense Refill  . [EXPIRED] cephALEXin (KEFLEX) 250 MG capsule Take 1 capsule (250 mg total) by mouth 2 (two) times daily. 60 capsule 11  . colchicine 0.6 MG tablet Take 0.5 tablets (0.3 mg total) by mouth daily. 30 tablet 0  . collagenase (SANTYL) ointment Apply topically daily. Applied to affected areas directed 15 g 0  . glucose blood test strip Use to test blood sugar 3 times daily 100 each 4  . HYDROcodone-acetaminophen (NORCO) 7.5-325 MG tablet Take 1-2 tablets by mouth every 4 (four) hours as needed for moderate pain. 30 tablet 0  . ketorolac (TORADOL) 10 MG tablet TAKE 1 TABLET BY MOUTH 4 TIMES A DAY AS NEEDED FOR PAIN  0  . loratadine (CLARITIN) 10 MG tablet Take 1 tablet (10 mg total) by mouth daily. 30 tablet 0  . midodrine (PROAMATINE) 10 MG tablet Take 1 tablet (10 mg total) by mouth 3 (three) times daily with meals. 90 tablet 0  . multivitamin (RENA-VIT) TABS tablet Take 1 tablet by mouth at bedtime. 30 tablet 0  . nitroGLYCERIN (NITROSTAT) 0.4 MG SL tablet Place 1 tablet (0.4 mg total) under the tongue every 5 (five) minutes as needed for chest pain. 25 tablet 3  . pantoprazole (PROTONIX) 40 MG tablet Take 1 tablet (40 mg total) by mouth daily. 30 tablet 0  . polyethylene glycol (MIRALAX / GLYCOLAX) packet Take 17 g by mouth daily as needed for mild constipation. 14 each 0  . PROAIR RESPICLICK 485 (90 BASE) MCG/ACT AEPB Inhale 1 puff into the lungs every 6 (six) hours as needed (for breathing).   0  . sucroferric oxyhydroxide (VELPHORO) 500 MG chewable tablet Chew 1 tablet (500 mg total) by mouth 3 (three) times daily with meals. 90 tablet 0  . warfarin (COUMADIN) 1 MG tablet Take 1 tablet (1 mg total) by mouth daily at 6 PM. (Patient taking differently: Take 1-2 mg by  mouth daily at 6 PM. Takes 1mg  daily except 2mg  on Fridays as of 09/02/17 office visit) 30 tablet 0  . insulin aspart (NOVOLOG) 100 UNIT/ML FlexPen Inject 3 Units into the skin 3 (three) times daily with meals. (Patient not taking: Reported on 09/03/2017) 15 mL 11  . Insulin Glargine (LANTUS) 100 UNIT/ML Solostar Pen Inject 8 Units into the skin daily at 10 pm. (Patient not taking: Reported on 09/03/2017) 15 mL 11    Home: [place] expects to be discharged to:: Private residence Living Arrangements: Spouse/significant other Available Help at Discharge: Family, Available 24 hours/day Type of Home: House Home Access: Stairs to enter [employer] of Steps: 1 Entrance  Stairs-Rails: None Home Layout: One level Bathroom Shower/Tub: Multimedia programmer: Standard Home Equipment: Environmental consultant - 4 wheels, Walker - standard, Tub bench (may be able to find more of "Mom's" equipment)  Lives With: Spouse  Functional History: Prior Function Level of Independence: Independent Functional Status:  Mobility: Bed Mobility Overal bed mobility: Needs Assistance Bed Mobility: Rolling, Sidelying to Sit Rolling: Min assist Sidelying to sit: Mod assist General bed mobility comments: Light mod assist to elevate trunk to sit Transfers Overall transfer level: Needs assistance Equipment used: Rolling walker (2 wheeled) Transfers: Sit to/from Stand Sit to Stand: Min assist General transfer comment: Min assist to steady; cues for hand lacement and safety Ambulation/Gait Ambulation/Gait assistance: Min guard Ambulation Distance (Feet): 50 Feet Assistive device: Rolling walker (2 wheeled) Gait Pattern/deviations: Step-through pattern General Gait Details: Cues to self-monitor for activity tolerance    ADL:    Cognition: Cognition Overall Cognitive Status: Within Functional Limits for tasks assessed Orientation Level: Oriented X4 Cognition Arousal/Alertness:  Awake/alert Behavior During Therapy: WFL for tasks assessed/performed Overall Cognitive Status: Within Functional Limits for tasks assessed  Blood pressure (!) 97/59, pulse 74, temperature 97.9 F (36.6 C), temperature source Oral, resp. rate 16, height 5\' 10"  (1.778 m), weight 70 kg (154 lb 5.2 oz), SpO2 99 %. Physical Exam  Vitals reviewed. Constitutional: He is oriented to person, place, and time. He appears well-developed.  Frail  HENT:  Head: Normocephalic and atraumatic.  Eyes: EOM are normal. Right eye exhibits no discharge. Left eye exhibits no discharge.  Neck: Normal range of motion. Neck supple. No thyromegaly present.  Cardiovascular:  Cardiac rate control  Respiratory:  Decreased breath sounds at the bases but clear to auscultation  GI: Soft. Bowel sounds are normal.  Colostomy in place  Musculoskeletal: He exhibits no edema or tenderness.  Neurological: He is alert and oriented to person, place, and time.  Motor: B/l UE 4/5 [name]: HF 2+/5, KE 3+/5, ADF/PF 4/5 LLE: HF 4-/5, KE 4/5, ADF/PF 4/5  Skin:  Sacral wound, right heel are dressed  Psychiatric: He has a normal mood and affect. His behavior is normal. Thought content normal.    Results for orders placed or performed during the hospital encounter of 09/03/17 (from the past 24 hour(s))  Renal function panel     Status: Abnormal   Collection Time: 09/05/17  8:00 AM  Result Value Ref Range   Sodium 130 (L) 135 - 145 mmol/L   Potassium 3.6 3.5 - 5.1 mmol/L   Chloride 93 (L) 101 - 111 mmol/L   CO2 24 22 - 32 mmol/L   Glucose, Bld 112 (H) 65 - 99 mg/dL   BUN 22 (H) 6 - 20 mg/dL   Creatinine, Ser 2.62 (H) 0.61 - 1.24 mg/dL   Calcium 8.1 (L) 8.9 - 10.3 mg/dL   Phosphorus 2.5 2.5 - 4.6 mg/dL   Albumin 2.3 (L) 3.5 - 5.0 g/dL   GFR calc non Af Amer 25 (L) >60 mL/min   GFR calc Af Amer 28 (L) >60 mL/min   Anion gap 13 5 - 15  Glucose, capillary     Status: Abnormal   Collection Time: 09/05/17  4:36 PM  Result Value  Ref Range   Glucose-Capillary 133 (H) 65 - 99 mg/dL  Glucose, capillary     Status: Abnormal   Collection Time: 09/05/17  9:27 PM  Result Value Ref Range   Glucose-Capillary 153 (H) 65 - 99 mg/dL  Protime-INR     Status: Abnormal  Collection Time: 09/06/17  3:53 AM  Result Value Ref Range   Prothrombin Time 25.0 (H) 11.4 - 15.2 seconds   INR 2.28   CBC     Status: Abnormal   Collection Time: 09/06/17  3:53 AM  Result Value Ref Range   WBC 7.4 4.0 - 10.5 K/uL   RBC 3.41 (L) 4.22 - 5.81 MIL/uL   Hemoglobin 10.8 (L) 13.0 - 17.0 g/dL   HCT 34.3 (L) 39.0 - 52.0 %   MCV 100.6 (H) 78.0 - 100.0 fL   MCH 31.7 26.0 - 34.0 pg   MCHC 31.5 30.0 - 36.0 g/dL   RDW 22.0 (H) 11.5 - 15.5 %   Platelets 83 (L) 150 - 400 K/uL  Vitamin B12     Status: Abnormal   Collection Time: 09/06/17  3:53 AM  Result Value Ref Range   Vitamin B-12 1,296 (H) 180 - 914 pg/mL   Dg Chest 1 View  Result Date: 09/04/2017 CLINICAL DATA:  Post right-sided thoracentesis EXAM: CHEST 1 VIEW COMPARISON:  09/03/2017; chest CT - 09/03/2017 FINDINGS: Grossly unchanged enlarged cardiac silhouette and mediastinal contours. Stable position of support apparatus. Interval reduction / near resolution of right-sided pleural effusion post thoracentesis. No pneumothorax. A small amount of fluid is seen tracking within the right major fissure. Overall improved aeration of the right lung base. Minimal bibasilar opacities favored to represent atelectasis. No new focal airspace opacities. No evidence of edema. No acute osseus abnormalities. Degenerative change of the left glenohumeral joint is suspected though incompletely evaluated. IMPRESSION: 1. Interval reduction / near resolution of right-sided pleural effusion post thoracentesis. No pneumothorax. 2. Improved aeration of lung bases with persistent bibasilar opacities, likely atelectasis. Electronically Signed   By: Sandi Mariscal M.D.   On: 09/04/2017 14:16   US Thoracentesis Asp Pleural Space  W/img Guide  Result Date: 09/04/2017 INDICATION: Symptomatic right sided pleural effusion EXAM: US THORACENTESIS ASP PLEURAL SPACE W/IMG GUIDE COMPARISON:  Chest CT - 09/03/2017; chest radiograph - 09/03/2017; 07/24/2017 MEDICATIONS: None. COMPLICATIONS: None immediate. TECHNIQUE: Informed written consent was obtained from the patient after a discussion of the risks, benefits and alternatives to treatment. A timeout was performed prior to the initiation of the procedure. Initial ultrasound scanning demonstrates a large anechoic right-sided pleural effusion. The lower chest was prepped and draped in the usual sterile fashion. 1% lidocaine was used for local anesthesia. An ultrasound image was saved for documentation purposes. An 8 Fr Safe-T-Centesis catheter was introduced. The thoracentesis was performed. The catheter was removed and a dressing was applied. The patient tolerated the procedure well without immediate post procedural complication. The patient was escorted to have an upright chest radiograph. FINDINGS: A total of approximately 1.8 liters of serous fluid was removed. Requested samples were sent to the laboratory. IMPRESSION: Successful ultrasound-guided right sided thoracentesis yielding 1.8 liters of pleural fluid. Electronically Signed   By: Sandi Mariscal M.D.   On: 09/04/2017 14:16    Assessment/Plan: Diagnosis: Debility Labs independently reviewed.  Records reviewed and summated above.  1. Does the need for close, 24 hr/day medical supervision in concert with the patient's rehab needs make it unreasonable for this patient to be served in a less intensive setting? Potentially 2. 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 Due to safety, does the patient require 24 hr/day rehab nursing? Potentially 4. Does the patient require coordinated care of a physician, rehab nurse, PT (1-2 hrs/day, 5 days/week) and OT (1-2 hrs/day, 5 days/week) to address physical and functional deficits in the context of the above medical diagnosis(es)? Potentially Addressing deficits in the following areas: balance, endurance, strength, bathing, dressing and psychosocial support 5. Can the patient actively participate in an intensive therapy program of at least 3 hrs of therapy per day at least 5 days per week? Yes 6. The potential for patient to make measurable gains while on inpatient rehab is good 7. Anticipated functional outcomes upon discharge from inpatient rehab are supervision  with PT, supervision with OT, n/a with SLP. 8. Estimated rehab length of stay to reach the above functional goals is: 5-7 days. 9. Anticipated D/C setting: Home 10. Anticipated post D/C treatments: HH therapy and Home excercise program 11. Overall Rehab/Functional Prognosis: good  RECOMMENDATIONS: This patient's condition is appropriate for continued rehabilitative care in the following setting: Pt recently received CIR and discharged at Supervision level.  Pt prefers to go home sooner vs. returning to CIR.  Given current functional level, believe this is reasonable.  Recommend discharge home when medically stable with caregiver support and follow up wtih PM&R as outpt. Patient has agreed to participate in recommended program. Potentially Note that insurance prior authorization may be required for reimbursement for recommended care.  Comment: Rehab Admissions Coordinator to follow up.  Delice Lesch, MD, ABPMR Lauraine Rinne J., PA-C 09/06/2017

## 2017-09-06 NOTE — Progress Notes (Signed)
ANTICOAGULATION CONSULT NOTE  Pharmacy Consult for warfarin Indication: atrial fibrillation  No Known Allergies  Patient Measurements: Height: 5\' 10"  (177.8 cm) Weight: 154 lb 5.2 oz (70 kg) IBW/kg (Calculated) : 73  Vital Signs: Temp: 97.8 F (36.6 C) (10/02 0943) Temp Source: Oral (10/02 0943) BP: 93/51 (10/02 0943) Pulse Rate: 71 (10/02 0943)  Labs:  Recent Labs  09/03/17 1829  09/04/17 0256 09/05/17 0329 09/05/17 0800 09/06/17 0353  HGB  --   < > 10.7* 10.5*  --  10.8*  HCT  --   --  33.6* 33.3*  --  34.3*  PLT  --   --  90* 91*  --  83*  LABPROT  --   --  24.9* 25.1*  --  25.0*  INR  --   --  2.28 2.30  --  2.28  CREATININE  --   --  3.53*  --  2.62*  --   TROPONINI 0.06*  --   --   --   --   --   < > = values in this interval not displayed.  Estimated Creatinine Clearance: 28.9 mL/min (A) (by C-G formula based on SCr of 2.62 mg/dL (H)).   Assessment: 69 YOM with AFib on warfarin. PTA the patient had INR checked on 9/28 which resulted at 1.8 and dose was changed to 1mg  daily except 2mg  on Fridays (previously 1mg  every day). Took warfarin 2mg  on 9/28.  He was admitted to [facility] on 09/03/17 for pneumonia, HCAP.  The INR on admission 9/29 was 2.37 therapeutic.   INR 2.28 this morning. INR remains therapeutic.   Hgb 10.8 low/stable, plts 83 low/stable- no bleeding noted, no hemoptysis today.  H/o chronic thrombocytopenia noted.    Goal of Therapy:  INR 2-3 Monitor platelets by anticoagulation protocol: Yes   Plan:  Warfarin 1mg  po x1 tonight Daily INR Follow for s/s bleeding  Nicole Cella, RPh Clinical Pharmacist Pager: [place] Phone for 09/06/2017 until 3:30pm: [phone] If after 3:30pm, please call [phone] or  main pharmacy at [phone] 09/06/2017 4:06 PM

## 2017-09-06 NOTE — Progress Notes (Signed)
Physical Therapy Wound Treatment Patient Details  Name: [name] MRN: [id number] Date of Birth: January 23, 1956  Today's Date: 09/06/2017 Time: 1030-1109 Time Calculation (min): 39 min  Subjective  Subjective: I was here recently for about 45 days Patient and Family Stated Goals: healed up so I can sit without pain.  Pain Score: Pain Score: 7   Wound Assessment  Pressure Injury 08/02/17 Unstageable - Full thickness tissue loss in which the base of the ulcer is covered by slough (yellow, tan, gray, green or brown) and/or eschar (tan, brown or black) in the wound bed. present on admission to rehab (Active)  Dressing Type ABD;Barrier Film (skin prep);Gauze (Comment);Moist to dry 09/06/2017 11:24 AM  Dressing Clean;Dry;Intact 09/06/2017 11:24 AM  Dressing Change Frequency Daily 09/06/2017 11:24 AM  State of Healing Eschar 09/06/2017 11:24 AM  Site / Wound Assessment Yellow;Pink 09/06/2017 11:24 AM  % Wound base Red or Granulating 20% 09/06/2017 11:24 AM  % Wound base Yellow/Fibrinous Exudate 80% 09/06/2017 11:24 AM  % Wound base Black/Eschar 30% 08/17/2017  2:00 PM  Peri-wound Assessment Erythema (blanchable);Intact 09/06/2017 11:24 AM  Wound Length (cm) 7 cm 09/06/2017 11:24 AM  Wound Width (cm) 5 cm 09/06/2017 11:24 AM  Wound Depth (cm) 2.5 cm 09/06/2017 11:24 AM  Wound Surface Area (cm^2) 35 cm^2 09/06/2017 11:24 AM  Wound Volume (cm^3) 87.5 cm^3 09/06/2017 11:24 AM  Margins Unattached edges (unapproximated) 09/06/2017 11:24 AM  Drainage Amount Moderate 09/06/2017 11:24 AM  Drainage Description No odor 09/06/2017 11:24 AM  Treatment Cleansed;Debridement (Selective);Hydrotherapy (Pulse lavage);Packing (Saline gauze) 09/06/2017 11:24 AM   Santyl applied to wound bed prior to applying dressing.    Hydrotherapy Pulsed lavage therapy - wound location: Sacral Pulsed Lavage with Suction (psi): 8 psi (to 12) Pulsed Lavage with Suction - Normal Saline Used: 1000 mL Pulsed Lavage Tip: Tip with splash  shield Selective Debridement Selective Debridement - Location: sacral Selective Debridement - Tools Used: Forceps;Scalpel;Scissors Selective Debridement - Tissue Removed: Thick yellow eschar/slough   Wound Assessment and Plan  Wound Therapy - Assess/Plan/Recommendations Wound Therapy - Clinical Statement: Pt can definitely benefit from PLS to decrease bacterial load, cleanse and further soften tissues for selective debridement.  As mobility improves, do not think this wound will continue to provide pt significant problems Wound Therapy - Functional Problem List: Decrease mobility (see PT notes),  Factors Delaying/Impairing Wound Healing: Diabetes Mellitus;Immobility;Multiple medical problems;Other (comment) Hydrotherapy Plan: Debridement;Dressing change;Patient/family education;Pulsatile lavage with suction Wound Therapy - Frequency: 6X / week Wound Therapy - Current Recommendations: Case manager/social work;PT Wound Therapy - Follow Up Recommendations: Home health RN Wound Plan: see above  Wound Therapy Goals- Improve the function of patient's integumentary system by progressing the wound(s) through the phases of wound healing (inflammation - proliferation - remodeling) by: Decrease Necrotic Tissue to: 10% Decrease Necrotic Tissue - Progress: Goal set today Increase Granulation Tissue to: 90% Increase Granulation Tissue - Progress: Goal set today Goals/treatment plan/discharge plan were made with and agreed upon by patient/family: Yes Time For Goal Achievement: 7 days Wound Therapy - Potential for Goals: Good  Goals will be updated until maximal potential achieved or discharge criteria met.  Discharge criteria: when goals achieved, discharge from hospital, MD decision/surgical intervention, no progress towards goals, refusal/missing three consecutive treatments without notification or medical reason.  GP     [name] Onix Jumper 09/06/2017, 11:36 AM  09/06/2017  Donnella Sham,  [place] [phone]  (pager)

## 2017-09-06 NOTE — Progress Notes (Signed)
[facility] Progress Note   Subjective:  Sitting up in bed. No CP, some coughing. Breathing improved further.   Objective Vitals:   09/05/17 1254 09/05/17 1755 09/05/17 2137 09/06/17 0459  BP: 96/61 (!) 103/57 (!) 92/53 (!) 97/59  Pulse: 70 72 74 74  Resp: 17 16 16 16   Temp: 97.7 F (36.5 C) 98 F (36.7 C) 98 F (36.7 C) 97.9 F (36.6 C)  TempSrc: Oral Oral Oral Oral  SpO2: 97% 98% 94% 99%  Weight:      Height:       Physical Exam General: Cachectic male, NAD.  Heart: RRR; no murmur Lungs: RUL expiratory rhonchi, otherwise clear Extremities: No LE edema Dialysis Access:  RUE AVF + bruit/thrill  Additional Objective Labs: Basic Metabolic Panel:  Recent Labs Lab 09/03/17 0323 09/04/17 0256 09/05/17 0800  NA 134* 133* 130*  K 4.3 4.2 3.6  CL 96* 96* 93*  CO2 25 21* 24  GLUCOSE 147* 109* 112*  BUN 16 26* 22*  CREATININE 2.76* 3.53* 2.62*  CALCIUM 8.7* 8.8* 8.1*  PHOS  --   --  2.5   Liver Function Tests:  Recent Labs Lab 09/03/17 0323 09/04/17 0256 09/05/17 0800  AST 53* 45*  --   ALT 14* 14*  --   ALKPHOS 116 98  --   BILITOT 3.2* 3.3*  --   PROT 6.7 6.7  --   ALBUMIN 2.3* 2.3* 2.3*   CBC:  Recent Labs Lab 09/03/17 0323 09/04/17 0256 09/05/17 0329 09/06/17 0353  WBC 15.9* 10.5 8.5 7.4  NEUTROABS 14.3*  --   --   --   HGB 10.3* 10.7* 10.5* 10.8*  HCT 32.5* 33.6* 33.3* 34.3*  MCV 101.6* 102.4* 99.4 100.6*  PLT 113* 90* 91* 83*   Blood Culture    Component Value Date/Time   SDES PLEURAL 09/04/2017 1400   SDES PLEURAL 09/04/2017 1400   SPECREQUEST NONE 09/04/2017 1400   SPECREQUEST NONE 09/04/2017 1400   CULT NO GROWTH < 24 HOURS 09/04/2017 1400   REPTSTATUS PENDING 09/04/2017 1400   REPTSTATUS 09/04/2017 FINAL 09/04/2017 1400   Cardiac Enzymes:  Recent Labs Lab 09/03/17 0856 09/03/17 1240 09/03/17 1829  TROPONINI 0.12* 0.05* 0.06*   CBG:  Recent Labs Lab 09/04/17 1711 09/04/17 2142 09/05/17 1636  09/05/17 2127 09/06/17 0813  GLUCAP 130* 89 133* 153* 155*   Studies/Results: Dg Chest 1 View  Result Date: 09/04/2017 CLINICAL DATA:  Post right-sided thoracentesis EXAM: CHEST 1 VIEW COMPARISON:  09/03/2017; chest CT - 09/03/2017 FINDINGS: Grossly unchanged enlarged cardiac silhouette and mediastinal contours. Stable position of support apparatus. Interval reduction / near resolution of right-sided pleural effusion post thoracentesis. No pneumothorax. A small amount of fluid is seen tracking within the right major fissure. Overall improved aeration of the right lung base. Minimal bibasilar opacities favored to represent atelectasis. No new focal airspace opacities. No evidence of edema. No acute osseus abnormalities. Degenerative change of the left glenohumeral joint is suspected though incompletely evaluated. IMPRESSION: 1. Interval reduction / near resolution of right-sided pleural effusion post thoracentesis. No pneumothorax. 2. Improved aeration of lung bases with persistent bibasilar opacities, likely atelectasis. Electronically Signed   By: Sandi Mariscal M.D.   On: 09/04/2017 14:16   US Thoracentesis Asp Pleural Space W/img Guide  Result Date: 09/04/2017 INDICATION: Symptomatic right sided pleural effusion EXAM: US THORACENTESIS ASP PLEURAL SPACE W/IMG GUIDE COMPARISON:  Chest CT - 09/03/2017; chest radiograph - 09/03/2017; 07/24/2017 MEDICATIONS: None. COMPLICATIONS: None immediate. TECHNIQUE: Informed  written consent was obtained from the patient after a discussion of the risks, benefits and alternatives to treatment. A timeout was performed prior to the initiation of the procedure. Initial ultrasound scanning demonstrates a large anechoic right-sided pleural effusion. The lower chest was prepped and draped in the usual sterile fashion. 1% lidocaine was used for local anesthesia. An ultrasound image was saved for documentation purposes. An 8 Fr Safe-T-Centesis catheter was introduced. The  thoracentesis was performed. The catheter was removed and a dressing was applied. The patient tolerated the procedure well without immediate post procedural complication. The patient was escorted to have an upright chest radiograph. FINDINGS: A total of approximately 1.8 liters of serous fluid was removed. Requested samples were sent to the laboratory. IMPRESSION: Successful ultrasound-guided right sided thoracentesis yielding 1.8 liters of pleural fluid. Electronically Signed   By: Sandi Mariscal M.D.   On: 09/04/2017 14:16   Medications: . sodium chloride    . ceFEPime (MAXIPIME) IV Stopped (09/05/17 2045)  . vancomycin Stopped (09/05/17 1421)   . colchicine  0.3 mg Oral Daily  . collagenase   Topical Daily  . feeding supplement (NEPRO CARB STEADY)  237 mL Oral BID BM  . insulin aspart  0-5 Units Subcutaneous QHS  . insulin aspart  0-9 Units Subcutaneous TID WC  . loratadine  10 mg Oral Daily  . midodrine  10 mg Oral TID WC  . multivitamin  1 tablet Oral QHS  . pantoprazole  40 mg Oral Daily  . sodium chloride flush  3 mL Intravenous Q12H  . sucroferric oxyhydroxide  500 mg Oral TID WC  . Warfarin - Pharmacist Dosing Inpatient   Does not apply q1800    Dialysis Orders: [place]/ [facility]  MWF 4h   3K/2.5  77kg   Hep none   R AVF -mircera 50 ug q 2, to start 10/3 -venofer 50/wk  Assessment/Plan: 1. HCAP: Per CT. On Vanc/Cefepime. S/p 1.8L thoracentesis 9/30: Cx pending, gram stain negative. Flu negative. 2. ESRD: Continue HD per MWF schedule, next 10/3.  3. HypoTN/volume: BP chronically low, on midodrine. 4. Anemia: Hgb 10.8. Due for ESA tomorrow, will re-dose. 5. Secondary hyperparathyroidism: Corr Ca ok, Phos slightly low. Monitor now. Continue Velphoro as binder. 6. Nutrition: Alb low, starting Nepro 7. 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 Afib: On warfarin 9. DM: On insulin. 10. Dispo: Requesting transfer of dialysis centers, this can be done as outpatient. Informed [facility] clinic  to begin paperwork.   Veneta Penton, PA-C 09/06/2017, 9:42 AM  [facility] Pager: [phone]  Pt seen, examined and agree w A/P as above.  Kelly Splinter MD [employer] pager [phone]   09/06/2017, 1:42 PM

## 2017-09-06 NOTE — Progress Notes (Addendum)
Initial Nutrition Assessment  DOCUMENTATION CODES:   Non-severe (moderate) malnutrition in context of chronic illness  INTERVENTION:    Continue Nepro Shake po BID, each supplement provides 425 kcal and 19 grams protein   Prostat liquid protein po 30 ml BID with meals, each supplement provides 100 kcal, 15 grams protein  NUTRITION DIAGNOSIS:   Malnutrition (moderate) related to chronic illness (ESRD on HD) as evidenced by moderate depletion of body fat, moderate depletions of muscle mass  GOAL:   Patient will meet greater than or equal to 90% of their needs  MONITOR:   PO intake, Supplement acceptance, Labs, Weight trends, Skin, I & O's  REASON FOR ASSESSMENT:   Consult Wound healing  ASSESSMENT:   61 yo Male with PMH of SAH, Pafib, Mild MR, Mild AR, CAD, CHF (EF 20%) ESRD on HD (M,W, F), apparently c/o cough with yellow sputum and mild hemoptysis. He was found to have health care associated pneumonia. Admitted for the management of hcap.   Pt reports his appetite is fair. PO intake variable at 0-100% per flowsheets. He typically consumes 3 meals per day:  Breakfast: oatmeal, toast Lunch: sandwich or hot meal (meat, starch, veggie) Dinner: frozen pizza or frozen meal Beverages: water or diet Sun Drop  Drinks 1 (one) Nepro or Ensure supplement per day at home. PT providing hydrotherapy/pulse lavage therapy. Pt states his weight has been stable.  Medications reviewed and include Rena-Vit, Coumadin, ABX. Labs reviewed. Na 130 (L). BUN 22 (H). Cr 2.62 (H). CBG's [id number].  Nutrition-Focused physical exam completed. Findings are moderate fat depletion, moderate muscle depletion, and no edema.   Diet Order:  Diet Heart Room service appropriate? Yes; Fluid consistency: Thin  Skin:   Unstageable pressure injury right heel Arterial ulceration right great toe tip  Trauma/arterial: right pretibial area Unstageable pressure injury: sacrum  Last BM:   10/1  Height:   Ht Readings from Last 1 Encounters:  09/03/17 5\' 10"  (1.778 m)   Weight:   Wt Readings from Last 1 Encounters:  09/05/17 154 lb 5.2 oz (70 kg)   Ideal Body Weight:  75.4 kg  BMI:  Body mass index is 22.14 kg/m.  Estimated Nutritional Needs:   Kcal:  2100-2300  Protein:  105-120 gm  Fluid:  1200 ml/day  EDUCATION NEEDS:   No education needs identified at this time  Arthur Holms, RD, LDN Pager #: [phone] After-Hours Pager #: [phone]

## 2017-09-06 NOTE — Progress Notes (Signed)
MD to bedside. Pt remains A&Ox4. Reports feeling "much better" and that he feels his confusion was due to a "bad dream." Confusion seems to have cleared. Remains neurologically intact. No new orders received. Will continue to monitor.

## 2017-09-06 NOTE — Consult Note (Signed)
   [facility] CM Inpatient Consult   09/06/2017  EUNICE OLDAKER 21-May-1956 [id number]  Chart reviewed for patient with Medicare ACO with a recent re-admission with pneumonia on antibiotics, ESRD on HD MWF.  Came by to speak with the patient in regarding to post hospital follow up needs.  Patient was confused stating, "I am  the president"  and he had his tele-box in his hand and said "this is my [employer] controller, and  I need to know what's going on because I am the president."  Was able to speak with the patient's nurse regarding his statements and confirmed that this was a new finding for him.  Will follow up as appropriate.  Natividad Brood, RN BSN [facility] Liaison  [phone] business mobile phone Toll free office [phone]

## 2017-09-06 NOTE — Progress Notes (Signed)
I met with pt at bedside to discuss his rehab needs. Recently discharged from [facility] rehab 08/20/17. He prefers to d/c home with [facility] as he had therapy and RN pta. Noted hydrotherapy initiated. Pt states he and his wife prefer home with [place] at this time. We will sign off. Please call with any questions. [phone]

## 2017-09-07 LAB — GLUCOSE, CAPILLARY
GLUCOSE-CAPILLARY: 102 mg/dL — AB (ref 65–99)
Glucose-Capillary: 148 mg/dL — ABNORMAL HIGH (ref 65–99)
Glucose-Capillary: 92 mg/dL (ref 65–99)
Glucose-Capillary: 151 mg/dL — ABNORMAL HIGH (ref 65–99)

## 2017-09-07 LAB — FOLATE RBC
Folate, Hemolysate: >620 ng/mL
Folate, RBC: >1699 ng/mL (ref >498)
Hematocrit: 36.5 % — ABNORMAL LOW (ref 37.5–51.0)

## 2017-09-07 LAB — RENAL FUNCTION PANEL
ALBUMIN: 1.9 g/dL — AB (ref 3.5–5.0)
Anion gap: 10 (ref 5–15)
BUN: 35 mg/dL — AB (ref 6–20)
CHLORIDE: 92 mmol/L — AB (ref 101–111)
CO2: 25 mmol/L (ref 22–32)
Calcium: 8.2 mg/dL — ABNORMAL LOW (ref 8.9–10.3)
Creatinine, Ser: 3.27 mg/dL — ABNORMAL HIGH (ref 0.61–1.24)
GFR calc Af Amer: 22 mL/min — ABNORMAL LOW (ref >60)
GFR calc non Af Amer: 19 mL/min — ABNORMAL LOW (ref >60)
GLUCOSE: 167 mg/dL — AB (ref 65–99)
PHOSPHORUS: 2.6 mg/dL (ref 2.5–4.6)
POTASSIUM: 3.3 mmol/L — AB (ref 3.5–5.1)
Sodium: 127 mmol/L — ABNORMAL LOW (ref 135–145)

## 2017-09-07 LAB — CBC
HCT: 35.4 % — ABNORMAL LOW (ref 39.0–52.0)
HEMATOCRIT: 31.2 % — AB (ref 39.0–52.0)
HEMOGLOBIN: 11.5 g/dL — AB (ref 13.0–17.0)
Hemoglobin: 10.2 g/dL — ABNORMAL LOW (ref 13.0–17.0)
MCH: 32.3 pg (ref 26.0–34.0)
MCH: 32.2 pg (ref 26.0–34.0)
MCHC: 32.5 g/dL (ref 30.0–36.0)
MCHC: 32.7 g/dL (ref 30.0–36.0)
MCV: 99.4 fL (ref 78.0–100.0)
MCV: 98.4 fL (ref 78.0–100.0)
Platelets: 90 10*3/uL — ABNORMAL LOW (ref 150–400)
Platelets: 65 10*3/uL — ABNORMAL LOW (ref 150–400)
RBC: 3.56 MIL/uL — AB (ref 4.22–5.81)
RBC: 3.17 MIL/uL — ABNORMAL LOW (ref 4.22–5.81)
RDW: 21.7 % — ABNORMAL HIGH (ref 11.5–15.5)
RDW: 21.1 % — AB (ref 11.5–15.5)
WBC: 7.3 10*3/uL (ref 4.0–10.5)
WBC: 6.2 10*3/uL (ref 4.0–10.5)

## 2017-09-07 LAB — PROTIME-INR
INR: 2.44
PROTHROMBIN TIME: 26.3 s — AB (ref 11.4–15.2)

## 2017-09-07 MED ORDER — WARFARIN SODIUM 1 MG PO TABS
1.0000 mg | ORAL_TABLET | Freq: Once | ORAL | Status: AC
  Administered 2017-09-07: 1 mg via ORAL
  Filled 2017-09-07: qty 1

## 2017-09-07 MED ORDER — VANCOMYCIN HCL IN DEXTROSE 750-5 MG/150ML-% IV SOLN
INTRAVENOUS | Status: AC
  Filled 2017-09-07: qty 150

## 2017-09-07 MED ORDER — DARBEPOETIN ALFA 60 MCG/0.3ML IJ SOSY
PREFILLED_SYRINGE | INTRAMUSCULAR | Status: AC
  Filled 2017-09-07: qty 0.3

## 2017-09-07 MED ORDER — MIDODRINE HCL 5 MG PO TABS
ORAL_TABLET | ORAL | Status: AC
  Administered 2017-09-07: 10 mg via ORAL
  Filled 2017-09-07: qty 2

## 2017-09-07 NOTE — Care Management Important Message (Signed)
Important Message  Patient Details  Name: [name] MRN: [id number] Date of Birth: 08-13-1956   Medicare Important Message Given:  Yes    [name] May 09/07/2017, 9:14 AM

## 2017-09-07 NOTE — Progress Notes (Signed)
PROGRESS NOTE    [name]  XBJ:[id number] DOB: 1956-11-12 DOA: 09/03/2017 PCP: Redmond School, MD   Brief Narrative: [name] is a 61 y.o. male,w Dm2, SAH, Pafib, Mild MR, Mild AR, CAD, CHF (EF 20%) ESRD on HD (M,W, F), apparently c/o cough with yellow sputum and mild hemoptysis. He was found to have health care associated pneumonia. Admitted for the management of hcap.    Assessment & Plan:   Principal Problem:   HCAP (healthcare-associated pneumonia) Active Problems:   DM type 2, uncontrolled, with renal complications (HCC)   Abnormal LFTs   Hyponatremia   Hemoptysis   Status post thoracentesis   Colostomy in place [facility])   ESRD on dialysis ([place])   Malnutrition of moderate degree   HCAP Patient treated with vancomycin and cefepime. Blood cultures without growth. No sputum culture sent on admission. Concern for worsening infiltrate, however, patient has been afebrile and with a normal WBC. -sputum gram stain/culture -discontinue Vancomycin -continue cefepime -SLP evaluation pending  Pleural effusion Appears to likely be transudate. Possibly from heart failure. Asymptomatic. Possible re-accumulation on last chest x-ray.  Sacral decubitus ulcer Unstageable. Afebrile. -hydrotherapy -wound care recommendations  Diabetes mellitus -continue SSI  Hypotension Chronic. -continue midodrine  Hemoptysis Resolved. Secondary to HCAP and frequent coughing.  ESRD on hemodialysis -nephrology recommendations  Thrombocytopenia Stable. Chronic.  Leukocytosis Resolved.  Hyponatremia Stable  Atrial fibrillation -continue Coumadin   DVT prophylaxis: Coumadin Code Status: Full code Family Communication: Wife at bedside Disposition Plan: Discharge likely in 24 hours   Consultants:   Nephrology  Procedures:   Hemodialysis  Antimicrobials:  Vancomycin (9/28>>10/3)  Cefepime (9/29>>   Subjective: Productive cough. Afebrile. No  hemoptysis.  Objective: Vitals:   09/06/17 1650 09/06/17 2052 09/07/17 0503 09/07/17 0739  BP: (!) 97/57 (!) 90/54 93/68 (!) 93/58  Pulse: 77 74 73 76  Resp: 18 18 18 18   Temp: 97.7 F (36.5 C) (!) 97.4 F (36.3 C) (!) 97 F (36.1 C) 98 F (36.7 C)  TempSrc: Oral Oral Oral Oral  SpO2: 97% 100% 94% 96%  Weight:      Height:        Intake/Output Summary (Last 24 hours) at 09/07/17 1251 Last data filed at 09/07/17 0920  Gross per 24 hour  Intake              420 ml  Output              125 ml  Net              295 ml   Filed Weights   09/05/17 0738 09/05/17 0955 09/05/17 1158  Weight: 72.7 kg (160 lb 4.4 oz) 72.6 kg (160 lb) 70 kg (154 lb 5.2 oz)    Examination:  General exam: Appears calm and comfortable Respiratory system: Clear to auscultation. Respiratory effort normal. Cardiovascular system: S1 & S2 heard, RRR. No murmurs Gastrointestinal system: Abdomen is nondistended, soft and nontender. Normal bowel sounds heard. Central nervous system: Alert and oriented. No focal neurological deficits. Extremities: No edema. No calf tenderness Skin: No cyanosis. No rashes Psychiatry: Judgement and insight appear normal. Mood & affect appropriate.     Data Reviewed: I have personally reviewed following labs and imaging studies  CBC:  Recent Labs Lab 09/03/17 0323 09/04/17 0256 09/05/17 0329 09/06/17 0353 09/07/17 0256  WBC 15.9* 10.5 8.5 7.4 7.3  NEUTROABS 14.3*  --   --   --   --   HGB 10.3* 10.7*  10.5* 10.8* 11.5*  HCT 32.5* 33.6* 33.3* 34.3* 35.4*  MCV 101.6* 102.4* 99.4 100.6* 99.4  PLT 113* 90* 91* 83* 90*   Basic Metabolic Panel:  Recent Labs Lab 09/03/17 0323 09/04/17 0256 09/05/17 0800  NA 134* 133* 130*  K 4.3 4.2 3.6  CL 96* 96* 93*  CO2 25 21* 24  GLUCOSE 147* 109* 112*  BUN 16 26* 22*  CREATININE 2.76* 3.53* 2.62*  CALCIUM 8.7* 8.8* 8.1*  PHOS  --   --  2.5   GFR: Estimated Creatinine Clearance: 28.9 mL/min (A) (by C-G formula based on  SCr of 2.62 mg/dL (H)). Liver Function Tests:  Recent Labs Lab 09/03/17 0323 09/04/17 0256 09/05/17 0800  AST 53* 45*  --   ALT 14* 14*  --   ALKPHOS 116 98  --   BILITOT 3.2* 3.3*  --   PROT 6.7 6.7  --   ALBUMIN 2.3* 2.3* 2.3*   No results for input(s): LIPASE, AMYLASE in the last 168 hours. No results for input(s): AMMONIA in the last 168 hours. Coagulation Profile:  Recent Labs Lab 09/03/17 0323 09/04/17 0256 09/05/17 0329 09/06/17 0353 09/07/17 0256  INR 2.37 2.28 2.30 2.28 2.44   Cardiac Enzymes:  Recent Labs Lab 09/03/17 0856 09/03/17 1240 09/03/17 1829  TROPONINI 0.12* 0.05* 0.06*   BNP (last 3 results) No results for input(s): PROBNP in the last 8760 hours. HbA1C: No results for input(s): HGBA1C in the last 72 hours. CBG:  Recent Labs Lab 09/06/17 1234 09/06/17 1654 09/06/17 2057 09/07/17 0812 09/07/17 1217  GLUCAP 157* 169* 137* 102* 148*   Lipid Profile: No results for input(s): CHOL, HDL, LDLCALC, TRIG, CHOLHDL, LDLDIRECT in the last 72 hours. Thyroid Function Tests: No results for input(s): TSH, T4TOTAL, FREET4, T3FREE, THYROIDAB in the last 72 hours. Anemia Panel:  Recent Labs  09/06/17 0353  VITAMINB12 1,296*   Sepsis Labs: No results for input(s): PROCALCITON, LATICACIDVEN in the last 168 hours.  Recent Results (from the past 240 hour(s))  Culture, blood (routine x 2) Call MD if unable to obtain prior to antibiotics being given     Status: None (Preliminary result)   Collection Time: 09/03/17  8:56 AM  Result Value Ref Range Status   Specimen Description BLOOD BLOOD LEFT HAND  Final   Special Requests IN PEDIATRIC BOTTLE Blood Culture adequate volume  Final   Culture NO GROWTH 3 DAYS  Final   Report Status PENDING  Incomplete  Culture, blood (routine x 2) Call MD if unable to obtain prior to antibiotics being given     Status: None (Preliminary result)   Collection Time: 09/03/17  9:00 AM  Result Value Ref Range Status    Specimen Description BLOOD BLOOD LEFT HAND  Final   Special Requests IN PEDIATRIC BOTTLE Blood Culture adequate volume  Final   Culture NO GROWTH 3 DAYS  Final   Report Status PENDING  Incomplete  Respiratory Panel by PCR     Status: None   Collection Time: 09/03/17  5:32 PM  Result Value Ref Range Status   Adenovirus NOT DETECTED NOT DETECTED Final   Coronavirus 229E NOT DETECTED NOT DETECTED Final   Coronavirus HKU1 NOT DETECTED NOT DETECTED Final   Coronavirus NL63 NOT DETECTED NOT DETECTED Final   Coronavirus OC43 NOT DETECTED NOT DETECTED Final   Metapneumovirus NOT DETECTED NOT DETECTED Final   Rhinovirus / Enterovirus NOT DETECTED NOT DETECTED Final   Influenza A NOT DETECTED NOT DETECTED Final  Influenza B NOT DETECTED NOT DETECTED Final   Parainfluenza Virus 1 NOT DETECTED NOT DETECTED Final   Parainfluenza Virus 2 NOT DETECTED NOT DETECTED Final   Parainfluenza Virus 3 NOT DETECTED NOT DETECTED Final   Parainfluenza Virus 4 NOT DETECTED NOT DETECTED Final   Respiratory Syncytial Virus NOT DETECTED NOT DETECTED Final   Bordetella pertussis NOT DETECTED NOT DETECTED Final   Chlamydophila pneumoniae NOT DETECTED NOT DETECTED Final   Mycoplasma pneumoniae NOT DETECTED NOT DETECTED Final  MRSA PCR Screening     Status: None   Collection Time: 09/03/17  5:32 PM  Result Value Ref Range Status   MRSA by PCR NEGATIVE NEGATIVE Final    Comment:        The GeneXpert MRSA Assay (FDA approved for NASAL specimens only), is one component of a comprehensive MRSA colonization surveillance program. It is not intended to diagnose MRSA infection nor to guide or monitor treatment for MRSA infections.   Culture, body fluid-bottle     Status: None (Preliminary result)   Collection Time: 09/04/17  2:00 PM  Result Value Ref Range Status   Specimen Description PLEURAL  Final   Special Requests NONE  Final   Culture NO GROWTH 2 DAYS  Final   Report Status PENDING  Incomplete  Gram stain      Status: None   Collection Time: 09/04/17  2:00 PM  Result Value Ref Range Status   Specimen Description PLEURAL  Final   Special Requests NONE  Final   Gram Stain   Final    WBC PRESENT, PREDOMINANTLY PMN NO ORGANISMS SEEN CYTOSPIN SMEAR    Report Status 09/04/2017 FINAL  Final         Radiology Studies: Dg Chest 2 View  Result Date: 09/06/2017 CLINICAL DATA:  61 year old male with bloody sputum and possible infection. Shortness breath. Subsequent encounter. EXAM: CHEST  2 VIEW COMPARISON:  09/04/2017 chest x-ray.  09/03/2017 chest CT. FINDINGS: AICD/biventricular pacer in place.  Cardiomegaly. Appearance of worsening airspace disease right lung may represent progressive mild pulmonary edema versus shifting of posteriorly layering pleural effusion. Right middle lobe consolidation as noted on recent CT. IMPRESSION: Appearance of worsening airspace disease right lung may represent progressive mild pulmonary edema versus shifting of posteriorly layering pleural effusion. Right middle lobe consolidation as noted on recent CT. Cardiomegaly. Electronically Signed   By: Genia Del M.D.   On: 09/06/2017 14:30        Scheduled Meds: . colchicine  0.3 mg Oral Daily  . collagenase   Topical Daily  . darbepoetin (ARANESP) injection - DIALYSIS  60 mcg Intravenous Q Wed-HD  . feeding supplement (NEPRO CARB STEADY)  237 mL Oral BID BM  . feeding supplement (PRO-STAT SUGAR FREE 64)  30 mL Oral BID WC  . insulin aspart  0-5 Units Subcutaneous QHS  . insulin aspart  0-9 Units Subcutaneous TID WC  . loratadine  10 mg Oral Daily  . midodrine  10 mg Oral TID WC  . multivitamin  1 tablet Oral QHS  . pantoprazole  40 mg Oral Daily  . sodium chloride flush  3 mL Intravenous Q12H  . sucroferric oxyhydroxide  500 mg Oral TID WC  . warfarin  1 mg Oral ONCE-1800  . Warfarin - Pharmacist Dosing Inpatient   Does not apply q1800   Continuous Infusions: . sodium chloride    . ceFEPime (MAXIPIME)  IV Stopped (09/05/17 2045)  . vancomycin Stopped (09/05/17 1421)     LOS:  4 days     Cordelia Poche, MD [facility] 09/07/2017, 12:51 PM Pager: [phone]  If 7PM-7AM, please contact night-coverage [email or web address] Password [email or web address] 09/07/2017, 12:51 PM

## 2017-09-07 NOTE — Progress Notes (Signed)
Physical Therapy Wound Treatment Patient Details  Name: [name] MRN: [id number] Date of Birth: December 03, 1956  Today's Date: 09/07/2017 Time: 9373-4287 Time Calculation (min): 32 min  Subjective  Patient and Family Stated Goals: healed up so I can sit without pain.  Pain Score: Pain Score: 8   Wound Assessment  Pressure Injury 08/02/17 Unstageable - Full thickness tissue loss in which the base of the ulcer is covered by slough (yellow, tan, gray, green or brown) and/or eschar (tan, brown or black) in the wound bed. present on admission to rehab (Active)  Dressing Type ABD;Barrier Film (skin prep);Gauze (Comment);Moist to dry 09/07/2017 12:06 PM  Dressing Clean;Dry;Intact 09/07/2017 12:06 PM  Dressing Change Frequency Daily 09/07/2017 12:06 PM  State of Healing Eschar 09/07/2017 12:06 PM  Site / Wound Assessment Yellow;Pink 09/07/2017 12:06 PM  % Wound base Red or Granulating 25% 09/07/2017 12:06 PM  % Wound base Yellow/Fibrinous Exudate 75% 09/07/2017 12:06 PM  % Wound base Black/Eschar 0% 09/07/2017 12:06 PM  % Wound base Other/Granulation Tissue (Comment) 0% 09/07/2017 12:06 PM  Peri-wound Assessment Erythema (blanchable);Intact 09/07/2017 12:06 PM  Wound Length (cm) 7 cm 09/06/2017 11:24 AM  Wound Width (cm) 5 cm 09/06/2017 11:24 AM  Wound Depth (cm) 2.5 cm 09/06/2017 11:24 AM  Wound Surface Area (cm^2) 35 cm^2 09/06/2017 11:24 AM  Wound Volume (cm^3) 87.5 cm^3 09/06/2017 11:24 AM  Margins Unattached edges (unapproximated) 09/07/2017 12:06 PM  Drainage Amount Minimal 09/07/2017 12:06 PM  Drainage Description Serous 09/07/2017 12:06 PM  Treatment Cleansed;Debridement (Selective);Hydrotherapy (Pulse lavage);Packing (Saline gauze) 09/07/2017 12:06 PM   Santyl applied to wound bed prior to applying dressing.    Hydrotherapy Pulsed lavage therapy - wound location: Sacral Pulsed Lavage with Suction (psi): 8 psi (to 12) Pulsed Lavage with Suction - Normal Saline Used: 1000 mL Pulsed Lavage Tip:  Tip with splash shield Selective Debridement Selective Debridement - Location: sacral Selective Debridement - Tools Used: Forceps;Scissors Selective Debridement - Tissue Removed: Thick yellow eschar/slough   Wound Assessment and Plan  Wound Therapy - Assess/Plan/Recommendations Wound Therapy - Clinical Statement: Pt can definitely benefit from PLS to decrease bacterial load, cleanse and further soften tissues for selective debridement.  As mobility improves, do not think this wound will continue to provide pt significant problems Wound Therapy - Functional Problem List: Decrease mobility (see PT notes),  Factors Delaying/Impairing Wound Healing: Diabetes Mellitus;Immobility;Multiple medical problems;Other (comment) Hydrotherapy Plan: Debridement;Dressing change;Patient/family education;Pulsatile lavage with suction Wound Therapy - Frequency: 6X / week Wound Therapy - Current Recommendations: Case manager/social work;PT Wound Therapy - Follow Up Recommendations: Home health RN Wound Plan: see above  Wound Therapy Goals- Improve the function of patient's integumentary system by progressing the wound(s) through the phases of wound healing (inflammation - proliferation - remodeling) by: Decrease Necrotic Tissue to: 10% Decrease Necrotic Tissue - Progress: Progressing toward goal Increase Granulation Tissue to: 90% Increase Granulation Tissue - Progress: Progressing toward goal Goals/treatment plan/discharge plan were made with and agreed upon by patient/family: Yes Time For Goal Achievement: 7 days Wound Therapy - Potential for Goals: Good  Goals will be updated until maximal potential achieved or discharge criteria met.  Discharge criteria: when goals achieved, discharge from hospital, MD decision/surgical intervention, no progress towards goals, refusal/missing three consecutive treatments without notification or medical reason.  GP     [name] 09/07/2017, 12:08  PM 09/07/2017  Donnella Sham, PT [phone]  (pager)

## 2017-09-07 NOTE — Consult Note (Signed)
WOC consulted 09/07/17, patient was seen on Monday by [facility] nurse, see wound care orders and hydrotherapy orders. WOC following along for weekly wound assessments and collaboration with PT for sacral ulcer.  [place], [place], [place]

## 2017-09-07 NOTE — Progress Notes (Signed)
Physical Therapy Treatment Patient Details Name: [name] MRN: [id number] DOB: April 02, 1956 Today's Date: 09/07/2017    History of Present Illness  [name] is a 61 y.o. male with ESRD, HD initiated 07/2017 during recent [facility] admit with MSSA bacteremia/shock. PMH includes  HTN, Type 2 DM, Hx Rectal cancer (s/p colostomy 2008), Afib (on warfarin), CAD (s/p stents), ischemic CM (EF 15-20%, ICD in place), OSA on CPAP, cirrhosis, thrombocytopenia. Admitted with productive cough and hemoptysis; suspicious for pneumonia; order for PT Hydro to follow for sacral ulcer.    PT Comments    Continuing work on functional mobility and activity tolerance;  Seen first this am, and [name] was quite sleepy, needing more assist and slower to respond to questions than earlier session; Discussed with RN, who has observed this as well;   Noted Mr. And [name] prefer not to return to CIR, or go to SNF -- given this, would maximize [facility] services; Still, there is the added factor of his sacral wound, and I've noted Case Mgmnt is exploring Outpt options for wound care (thanks!); I anticipate good progress with mobility, provided no more episodes of delirium occur, and from a mobility standpoint, HH services, along with 24 hour assistance, will be adequate -- We still absolutely need to figure out the best option to get his wound care needs met (and I do believe they can be met at [facility] or SNF)   Follow Up Recommendations  Other (comment) (See above) Would maximize [facility] services, and find best options for wound care   Equipment Recommendations  None recommended by PT    Recommendations for Other Services OT consult (ordered per protocol)     Precautions / Restrictions Precautions Precautions: Fall Precaution Comments: sacral ulcer    Mobility  Bed Mobility Overal bed mobility: Needs Assistance Bed Mobility: Rolling;Sidelying to Sit Rolling: Min assist Sidelying to sit: Mod assist       General  bed mobility comments: Light mod assist to elevate trunk to sit; slower movign  Transfers Overall transfer level: Needs assistance Equipment used: Rolling walker (2 wheeled) Transfers: Sit to/from Stand Sit to Stand: Min assist         General transfer comment: Min assist to steady; cues for hand placement and safety  Ambulation/Gait Ambulation/Gait assistance: Min assist Ambulation Distance (Feet): 40 Feet Assistive device: Rolling walker (2 wheeled) Gait Pattern/deviations: Step-through pattern Gait velocity: slowed   General Gait Details: Cues to self-monitor for activity tolerance; less steady today   Stairs            Wheelchair Mobility    Modified Rankin (Stroke Patients Only)       Balance     Sitting balance-Leahy Scale: Good       Standing balance-Leahy Scale: Poor (Dependent on UE suport)                              Cognition Arousal/Alertness: Awake/alert Behavior During Therapy: WFL for tasks assessed/performed Overall Cognitive Status: Impaired/Different from baseline Area of Impairment: Safety/judgement                         Safety/Judgement: Decreased awareness of safety;Decreased awareness of deficits            Exercises      General Comments        Pertinent Vitals/Pain Pain Assessment: 0-10 Pain Score: 7  Pain Location:  sacrum Pain Descriptors / Indicators: Aching Pain Intervention(s): Monitored during session;Other (comment) (Discussed with RN; pain meds better used for [facility])    Home Living                      Prior Function            PT Goals (current goals can now be found in the care plan section) Acute Rehab PT Goals Patient Stated Goal: get better PT Goal Formulation: With patient Time For Goal Achievement: 09/19/17 Potential to Achieve Goals: Good Progress towards PT goals: Not progressing toward goals - comment (Very sleepy this am)    Frequency    Min  3X/week      PT Plan Current plan remains appropriate    Co-evaluation              AM-PAC PT "6 Clicks" Daily Activity  Outcome Measure  Difficulty turning over in bed (including adjusting bedclothes, sheets and blankets)?: A Little Difficulty moving from lying on back to sitting on the side of the bed? : A Lot Difficulty sitting down on and standing up from a chair with arms (e.g., wheelchair, bedside commode, etc,.)?: A Little Help needed moving to and from a bed to chair (including a wheelchair)?: A Little Help needed walking in hospital room?: A Little Help needed climbing 3-5 steps with a railing? : A Lot 6 Click Score: 16    End of Session Equipment Utilized During Treatment: Gait belt Activity Tolerance: Patient tolerated treatment well;Other (comment) (though acting sleepy) Patient left: in bed;with call bell/phone within reach;Other (comment);with bed alarm set (sitting EOB) Nurse Communication: Mobility status PT Visit Diagnosis: Unsteadiness on feet (R26.81);Muscle weakness (generalized) (M62.81);Other abnormalities of gait and mobility (R26.89);Pain Pain - part of body:  (Sacrum)     Time: 7588-3254 PT Time Calculation (min) (ACUTE ONLY): 15 min  Charges:  $Gait Training: 8-22 mins                    G Codes:       Roney Marion, PT  Acute Rehabilitation Services Pager [phone] Office [place] 09/07/2017, 10:15 AM

## 2017-09-07 NOTE — Progress Notes (Signed)
[facility] Progress Note   Subjective:  Coughing up white, thick phlegm, but otherwise feels ok. No CP or fevers.   Objective Vitals:   09/06/17 1650 09/06/17 2052 09/07/17 0503 09/07/17 0739  BP: (!) 97/57 (!) 90/54 93/68 (!) 93/58  Pulse: 77 74 73 76  Resp: 18 18 18 18   Temp: 97.7 F (36.5 C) (!) 97.4 F (36.3 C) (!) 97 F (36.1 C) 98 F (36.7 C)  TempSrc: Oral Oral Oral Oral  SpO2: 97% 100% 94% 96%  Weight:      Height:       Physical Exam General: Cachectic male, NAD.  Heart: RRR; no murmur Lungs: RUL expiratory rhonchi, otherwise clear Extremities: No LE edema Dialysis Access:  RUE AVF + bruit/thrill  Additional Objective Labs: Basic Metabolic Panel:  Recent Labs Lab 09/03/17 0323 09/04/17 0256 09/05/17 0800  NA 134* 133* 130*  K 4.3 4.2 3.6  CL 96* 96* 93*  CO2 25 21* 24  GLUCOSE 147* 109* 112*  BUN 16 26* 22*  CREATININE 2.76* 3.53* 2.62*  CALCIUM 8.7* 8.8* 8.1*  PHOS  --   --  2.5   Liver Function Tests:  Recent Labs Lab 09/03/17 0323 09/04/17 0256 09/05/17 0800  AST 53* 45*  --   ALT 14* 14*  --   ALKPHOS 116 98  --   BILITOT 3.2* 3.3*  --   PROT 6.7 6.7  --   ALBUMIN 2.3* 2.3* 2.3*   CBC:  Recent Labs Lab 09/03/17 0323 09/04/17 0256 09/05/17 0329 09/06/17 0353 09/07/17 0256  WBC 15.9* 10.5 8.5 7.4 7.3  NEUTROABS 14.3*  --   --   --   --   HGB 10.3* 10.7* 10.5* 10.8* 11.5*  HCT 32.5* 33.6* 33.3* 34.3* 35.4*  MCV 101.6* 102.4* 99.4 100.6* 99.4  PLT 113* 90* 91* 83* 90*   Blood Culture    Component Value Date/Time   SDES PLEURAL 09/04/2017 1400   SDES PLEURAL 09/04/2017 1400   SPECREQUEST NONE 09/04/2017 1400   SPECREQUEST NONE 09/04/2017 1400   CULT NO GROWTH 2 DAYS 09/04/2017 1400   REPTSTATUS PENDING 09/04/2017 1400   REPTSTATUS 09/04/2017 FINAL 09/04/2017 1400    Cardiac Enzymes:  Recent Labs Lab 09/03/17 0856 09/03/17 1240 09/03/17 1829  TROPONINI 0.12* 0.05* 0.06*   CBG:  Recent  Labs Lab 09/06/17 0813 09/06/17 1234 09/06/17 1654 09/06/17 2057 09/07/17 0812  GLUCAP 155* 157* 169* 137* 102*   Studies/Results: Dg Chest 2 View  Result Date: 09/06/2017 CLINICAL DATA:  61 year old male with bloody sputum and possible infection. Shortness breath. Subsequent encounter. EXAM: CHEST  2 VIEW COMPARISON:  09/04/2017 chest x-ray.  09/03/2017 chest CT. FINDINGS: AICD/biventricular pacer in place.  Cardiomegaly. Appearance of worsening airspace disease right lung may represent progressive mild pulmonary edema versus shifting of posteriorly layering pleural effusion. Right middle lobe consolidation as noted on recent CT. IMPRESSION: Appearance of worsening airspace disease right lung may represent progressive mild pulmonary edema versus shifting of posteriorly layering pleural effusion. Right middle lobe consolidation as noted on recent CT. Cardiomegaly. Electronically Signed   By: Genia Del M.D.   On: 09/06/2017 14:30   Medications: . sodium chloride    . ceFEPime (MAXIPIME) IV Stopped (09/05/17 2045)  . vancomycin Stopped (09/05/17 1421)   . colchicine  0.3 mg Oral Daily  . collagenase   Topical Daily  . darbepoetin (ARANESP) injection - DIALYSIS  60 mcg Intravenous Q Wed-HD  . feeding supplement (NEPRO CARB STEADY)  237 mL Oral BID BM  . feeding supplement (PRO-STAT SUGAR FREE 64)  30 mL Oral BID WC  . insulin aspart  0-5 Units Subcutaneous QHS  . insulin aspart  0-9 Units Subcutaneous TID WC  . loratadine  10 mg Oral Daily  . midodrine  10 mg Oral TID WC  . multivitamin  1 tablet Oral QHS  . pantoprazole  40 mg Oral Daily  . sodium chloride flush  3 mL Intravenous Q12H  . sucroferric oxyhydroxide  500 mg Oral TID WC  . Warfarin - Pharmacist Dosing Inpatient   Does not apply q1800    Dialysis Orders: [place]/ [facility] MWF 4h 3K/2.5 77kg Hep none R AVF -mircera 50 ug q 2, to start 10/3 -venofer 50/wk  Assessment/Plan: 1. HCAP: Per CT. On  Vanc/Cefepime. S/p 1.8L thoracentesis 9/30: Cx pending, gram stain negative. Flu negative. 2. ESRD: Continue HD per MWF schedule, will be dialyzed 2nd shift today.  3. HypoTN/volume: BP chronically low, on midodrine. 4. Anemia: Hgb 11.5. Due for ESA tomorrow, will re-dose. 5. Secondary hyperparathyroidism: Corr Ca ok, Phos slightly low. Monitor now. Continue Velphoro as binder. 6. Nutrition: Alb low, starting Nepro 7. 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 Afib: On warfarin 9. DM: On insulin. 10. Dispo: Requesting transfer of dialysis centers (to [facility]), this can be done as outpatient. Informed [facility] clinic to begin paperwork, looks like may be approved prior to discharge. OK for dc from renal standpoint.   Veneta Penton, PA-C 09/07/2017, 9:42 AM  [facility] Pager: [phone]  Pt seen, examined and agree w A/P as above.  Kelly Splinter MD [employer] pager [phone]   09/07/2017, 12:50 PM

## 2017-09-07 NOTE — Progress Notes (Signed)
Pharmacy Antibiotic Note  [name] is a 61 y.o. male admitted on 09/03/2017 with pneumonia.  Day #5 Vancomycin/Cefepime dosing per pharmacy consult.. Pt has ESRD on HD MWF, WBC was elevated on admission, currently WBC is within normal limits.  Afebrile.  Pleural fluid Cx and blood cx no growth to date.   Plan:  Continue Vancomycin 750 mg IV qHD- MWF Continue Cefepime 2g IV qHD - MWF Trend WBC, temp, HD schedule  F/U infectious work-up Drug levels as indicated-consider pre HD vancomycin level on Friday 09/09/17.   Height: 5\' 10"  (177.8 cm) Weight: 154 lb 5.2 oz (70 kg) IBW/kg (Calculated) : 73  Temp (24hrs), Avg:97.5 F (36.4 C), Min:97 F (36.1 C), Max:98 F (36.7 C)   Recent Labs Lab 09/03/17 0323 09/04/17 0256 09/05/17 0329 09/05/17 0800 09/06/17 0353 09/07/17 0256  WBC 15.9* 10.5 8.5  --  7.4 7.3  CREATININE 2.76* 3.53*  --  2.62*  --   --     Estimated Creatinine Clearance: 28.9 mL/min (A) (by C-G formula based on SCr of 2.62 mg/dL (H)).    No Known Allergies   Antimicrobials this admission:  Vanc 9/29>> *loaded with 1500mg  Cefepime 9/29>>  Dose adjustments:  None at this time  Microbiology: 9/30 Sputum (pleura) gram stain: no organisms seen/final 9/30 body fluid (pleural) Cx: ngtd 9/29 BCx: ngtd 9/29 resp panel: neg 9/29 MRSA PCR: neg  Nicole Cella, RPh Clinical Pharmacist Pager: [phone]-[phone] [phone] phone [phone] or [phone] Main pharmacy [phone] 09/07/2017 10:50 AM

## 2017-09-07 NOTE — Progress Notes (Signed)
ANTICOAGULATION CONSULT NOTE  Pharmacy Consult for warfarin Indication: atrial fibrillation  No Known Allergies  Patient Measurements: Height: 5\' 10"  (177.8 cm) Weight: 154 lb 5.2 oz (70 kg) IBW/kg (Calculated) : 73  Vital Signs: Temp: 98 F (36.7 C) (10/03 0739) Temp Source: Oral (10/03 0739) BP: 93/58 (10/03 0739) Pulse Rate: 76 (10/03 0739)  Labs:  Recent Labs  09/05/17 0329 09/05/17 0800 09/06/17 0353 09/07/17 0256  HGB 10.5*  --  10.8* 11.5*  HCT 33.3*  --  34.3* 35.4*  PLT 91*  --  83* 90*  LABPROT 25.1*  --  25.0* 26.3*  INR 2.30  --  2.28 2.44  CREATININE  --  2.62*  --   --     Estimated Creatinine Clearance: 28.9 mL/min (A) (by C-G formula based on SCr of 2.62 mg/dL (H)).   Assessment: 53 YOM with AFib on warfarin. PTA the patient had INR checked on 9/28 which resulted at 1.8 and dose was changed to 1mg  daily except 2mg  on Fridays (previously 1mg  every day). PTA dose last taken on 9/28.  He was admitted to [facility] on 09/03/17 for pneumonia, HCAP.  The INR on admission 9/29 was 2.37 therapeutic.   INR 2.44  this morning 09/07/17. INR remains therapeutic.  We have continued his PTA dose of 1mg  daily except 2mg  qFriday since admission.  Hgb 11.5 low/stable/improved, plts 9083 low/stable/improved. No bleeding noted. H/o chronic thrombocytopenia noted.    Goal of Therapy:  INR 2-3 Monitor platelets by anticoagulation protocol: Yes   Plan:  Warfarin 1mg  po x1 tonight Daily INR Follow for s/s bleeding  Nicole Cella, RPh Clinical Pharmacist Pager: [phone] Clinical Phone for 09/07/2017 until 3:30pm: [phone] If after 3:30pm, please call [phone] or  main pharmacy at [phone] 09/07/2017 11:12 AM

## 2017-09-08 ENCOUNTER — Encounter: Payer: Medicare Other | Attending: Physical Medicine & Rehabilitation | Admitting: Physical Medicine & Rehabilitation

## 2017-09-08 ENCOUNTER — Inpatient Hospital Stay (HOSPITAL_COMMUNITY): Payer: Medicare Other

## 2017-09-08 LAB — PROTIME-INR
INR: 2.45
Prothrombin Time: 26.4 seconds — ABNORMAL HIGH (ref 11.4–15.2)

## 2017-09-08 LAB — CBC
HCT: 36.1 % — ABNORMAL LOW (ref 39.0–52.0)
Hemoglobin: 11.5 g/dL — ABNORMAL LOW (ref 13.0–17.0)
MCH: 31.9 pg (ref 26.0–34.0)
MCHC: 31.9 g/dL (ref 30.0–36.0)
MCV: 100.3 fL — ABNORMAL HIGH (ref 78.0–100.0)
PLATELETS: 66 10*3/uL — AB (ref 150–400)
RBC: 3.6 MIL/uL — ABNORMAL LOW (ref 4.22–5.81)
RDW: 21.8 % — AB (ref 11.5–15.5)
WBC: 6.8 10*3/uL (ref 4.0–10.5)

## 2017-09-08 LAB — CULTURE, BLOOD (ROUTINE X 2)
CULTURE: NO GROWTH
CULTURE: NO GROWTH
SPECIAL REQUESTS: ADEQUATE
SPECIAL REQUESTS: ADEQUATE

## 2017-09-08 LAB — GLUCOSE, CAPILLARY
Glucose-Capillary: 122 mg/dL — ABNORMAL HIGH (ref 65–99)
Glucose-Capillary: 165 mg/dL — ABNORMAL HIGH (ref 65–99)
Glucose-Capillary: 219 mg/dL — ABNORMAL HIGH (ref 65–99)
Glucose-Capillary: 146 mg/dL — ABNORMAL HIGH (ref 65–99)

## 2017-09-08 MED ORDER — GUAIFENESIN ER 600 MG PO TB12
1200.0000 mg | ORAL_TABLET | Freq: Two times a day (BID) | ORAL | Status: DC
  Administered 2017-09-08 – 2017-09-15 (×14): 1200 mg via ORAL
  Filled 2017-09-08 (×14): qty 2

## 2017-09-08 MED ORDER — WARFARIN SODIUM 1 MG PO TABS
1.0000 mg | ORAL_TABLET | Freq: Once | ORAL | Status: AC
  Administered 2017-09-08: 1 mg via ORAL
  Filled 2017-09-08: qty 1

## 2017-09-08 NOTE — Consult Note (Addendum)
[place] Nurse wound follow up Follow up on sacral wound, s/p hydrotherapy. Met with PT for treatment at the bedside this am to evaluate wound status and decide on any updated wound care orders.  Wound type:  Stage 4 Pressure injury, now that slough has been cleared, palpable bone in the base Measurement: see PT notes (my measurements from Monday  6cm x 5cm x 1.2) however much deeper now that slough has been cleared Wound bed:50% yellow/50% pink Drainage (amount, consistency, odor) moderate, serosanguinous  Periwound: intact Dressing procedure/placement/frequency: Continue hydrotherapy  to attempt to clear remainder of slough.  Will progress to NPWT once less than 25% slough present.  Maximize nutrition for wound healing.   Will need therapeutic sleep surface at DC (LALM or overlay for home bed).  Limit time up in the chair to less than 2 hours per day.  Chair pressure redistribution pad to be used when up in chair. Prevalon bot to the right foot to offload heel pressure injury Continue betadine application daily to the right great toe, right pretibial wound, and right heel.   [place] Nurse team will follow along with you for weekly wound assessments.  Please notify me of any acute changes in the wounds or any new areas of concerns Trenese Haft [facility] MSN, RN,CWOCN, CNS, CWON-AP [phone]   Contacted hospitalist to notify him of today's assessment  Collaboration with CM on DC plans possibly to LTAC for continued wound care, rehab, and HD.

## 2017-09-08 NOTE — Progress Notes (Signed)
[facility] Progress Note   Subjective:  Seen in room. No issues with HD yesterday. Still coughing. No dyspnea.  Objective Vitals:   09/07/17 1800 09/07/17 1811 09/07/17 2135 09/08/17 0444  BP: (!) 91/48 (!) 97/49 98/64 (!) 102/51  Pulse: 71 77 69 73  Resp:  17 16 16   Temp:  (!) 97.5 F (36.4 C) (!) 97.5 F (36.4 C) 97.6 F (36.4 C)  TempSrc:  Oral Oral Oral  SpO2:  98% 94% 99%  Weight:  71.2 kg (157 lb)    Height:       Physical Exam General: Cachectic male, NAD.  Heart: RRR; no murmur Lungs: Expiratory rhonchi throughout, no rales Extremities: No LE edema Dialysis Access: RUE AVF + bruit/thrill  Additional Objective Labs: Basic Metabolic Panel:  Recent Labs Lab 09/04/17 0256 09/05/17 0800 09/07/17 1330  NA 133* 130* 127*  K 4.2 3.6 3.3*  CL 96* 93* 92*  CO2 21* 24 25  GLUCOSE 109* 112* 167*  BUN 26* 22* 35*  CREATININE 3.53* 2.62* 3.27*  CALCIUM 8.8* 8.1* 8.2*  PHOS  --  2.5 2.6   Liver Function Tests:  Recent Labs Lab 09/03/17 0323 09/04/17 0256 09/05/17 0800 09/07/17 1330  AST 53* 45*  --   --   ALT 14* 14*  --   --   ALKPHOS 116 98  --   --   BILITOT 3.2* 3.3*  --   --   PROT 6.7 6.7  --   --   ALBUMIN 2.3* 2.3* 2.3* 1.9*   CBC:  Recent Labs Lab 09/03/17 0323  09/05/17 0329 09/06/17 0353  09/07/17 0256 09/07/17 1330 09/08/17 0225  WBC 15.9*  < > 8.5 7.4  --  7.3 6.2 6.8  NEUTROABS 14.3*  --   --   --   --   --   --   --   HGB 10.3*  < > 10.5* 10.8*  --  11.5* 10.2* 11.5*  HCT 32.5*  < > 33.3* 34.3*  < > 35.4* 31.2* 36.1*  MCV 101.6*  < > 99.4 100.6*  --  99.4 98.4 100.3*  PLT 113*  < > 91* 83*  --  90* 65* 66*  < > = values in this interval not displayed. Blood Culture    Component Value Date/Time   SDES PLEURAL 09/04/2017 1400   SDES PLEURAL 09/04/2017 1400   SPECREQUEST NONE 09/04/2017 1400   SPECREQUEST NONE 09/04/2017 1400   CULT NO GROWTH 3 DAYS 09/04/2017 1400   REPTSTATUS PENDING 09/04/2017 1400    REPTSTATUS 09/04/2017 FINAL 09/04/2017 1400   Cardiac Enzymes:  Recent Labs Lab 09/03/17 0856 09/03/17 1240 09/03/17 1829  TROPONINI 0.12* 0.05* 0.06*   CBG:  Recent Labs Lab 09/07/17 0812 09/07/17 1217 09/07/17 1917 09/07/17 2132 09/08/17 0827  GLUCAP 102* 148* 92 151* 122*   Studies/Results: Dg Chest 2 View  Result Date: 09/08/2017 CLINICAL DATA:  Pleural effusion EXAM: CHEST  2 VIEW COMPARISON:  09/06/2017 FINDINGS: Small to moderate layering right pleural effusion. Associated right lower lobe opacity, likely atelectasis. Left lung is clear. No pneumothorax. Cardiomegaly.  Left chest ICD. Mild degenerative changes of the visualized thoracolumbar spine. IMPRESSION: Small to moderate layering right pleural effusion. Associated right lower lobe opacity, likely atelectasis. Electronically Signed   By: Julian Hy M.D.   On: 09/08/2017 07:46   Dg Chest 2 View  Result Date: 09/06/2017 CLINICAL DATA:  61 year old male with bloody sputum and possible infection. Shortness breath. Subsequent encounter.  EXAM: CHEST  2 VIEW COMPARISON:  09/04/2017 chest x-ray.  09/03/2017 chest CT. FINDINGS: AICD/biventricular pacer in place.  Cardiomegaly. Appearance of worsening airspace disease right lung may represent progressive mild pulmonary edema versus shifting of posteriorly layering pleural effusion. Right middle lobe consolidation as noted on recent CT. IMPRESSION: Appearance of worsening airspace disease right lung may represent progressive mild pulmonary edema versus shifting of posteriorly layering pleural effusion. Right middle lobe consolidation as noted on recent CT. Cardiomegaly. Electronically Signed   By: Genia Del M.D.   On: 09/06/2017 14:30   Medications: . sodium chloride    . ceFEPime (MAXIPIME) IV Stopped (09/07/17 2217)   . colchicine  0.3 mg Oral Daily  . collagenase   Topical Daily  . darbepoetin (ARANESP) injection - DIALYSIS  60 mcg Intravenous Q Wed-HD  . feeding  supplement (NEPRO CARB STEADY)  237 mL Oral BID BM  . feeding supplement (PRO-STAT SUGAR FREE 64)  30 mL Oral BID WC  . insulin aspart  0-5 Units Subcutaneous QHS  . insulin aspart  0-9 Units Subcutaneous TID WC  . loratadine  10 mg Oral Daily  . midodrine  10 mg Oral TID WC  . multivitamin  1 tablet Oral QHS  . pantoprazole  40 mg Oral Daily  . sodium chloride flush  3 mL Intravenous Q12H  . sucroferric oxyhydroxide  500 mg Oral TID WC  . Warfarin - Pharmacist Dosing Inpatient   Does not apply q1800    Dialysis Orders: [place]/ [facility] MWF 4h 3K/2.5 77kg Hep none R AVF - Mircera 50 ug q 2, to start 10/3 - Venofer 50/wk  Assessment/Plan: 1. HCAP: Per CT. Was on Vanc/Cefepime -> now Cefepime alone. S/p 1.8L thora 9/30: Cx negative. Flu negative. 2. ESRD: Continue HD per MWF schedule, next HD 10/5 (either here or as outpt) 3.HypoTN/volume: BP chronically low, on midodrine. Well under last EDW, will need to be lowered on d/c. 4. Anemia: Hgb 11.5. S/p Aranesp 45mcg 10/3. 5. Secondary hyperparathyroidism:Corr Ca ok, Phos slightly low. Monitor now. Continue Velphoro as binder. 6. Nutrition: Alb low, starting Nepro 7. 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 Afib: On warfarin 9. DM: On insulin. 10. Dispo: Requesting transfer of dialysis centers (to [facility]), this can be done as outpatient. Informed [facility] clinic to begin paperwork, looks like already approved and will be starting at [facility] on discharge. Chair time 11:45a on MWF, needs to be there at 11:00am on first day for paperwork.  11. Sacral decub - stage IV, seen by [facility] team again today.  Have d/w primary MD, not ready for dc given these problems.   Veneta Penton, PA-C 09/08/2017, 9:51 AM  [facility] Pager: [phone]  Pt seen, examined, agree w assess/plan as above with additions as indicated.  Kelly Splinter MD [employer] pager [phone]    cell  [phone] 09/08/2017, 1:02 PM

## 2017-09-08 NOTE — Progress Notes (Signed)
ANTICOAGULATION CONSULT NOTE  Pharmacy Consult for warfarin Indication: atrial fibrillation  No Known Allergies  Patient Measurements: Height: 5\' 10"  (177.8 cm) Weight: 157 lb (71.2 kg) IBW/kg (Calculated) : 73  Vital Signs: Temp: 97.6 F (36.4 C) (10/04 0444) Temp Source: Oral (10/04 0444) BP: 102/51 (10/04 0444) Pulse Rate: 73 (10/04 0444)  Labs:  Recent Labs  09/06/17 0353  09/07/17 0256 09/07/17 1330 09/08/17 0225  HGB 10.8*  --  11.5* 10.2* 11.5*  HCT 34.3*  < > 35.4* 31.2* 36.1*  PLT 83*  --  90* 65* 66*  LABPROT 25.0*  --  26.3*  --  26.4*  INR 2.28  --  2.44  --  2.45  CREATININE  --   --   --  3.27*  --   < > = values in this interval not displayed.  Estimated Creatinine Clearance: 23.6 mL/min (A) (by C-G formula based on SCr of 3.27 mg/dL (H)).   Assessment: 44 YOM with AFib on warfarin. PTA the patient had INR checked on 9/28 which resulted at 1.8 and dose was changed to 1mg  daily except 2mg  on Fridays (previously 1mg  every day). PTA dose last taken on 9/28.  He was admitted to [facility] on 09/03/17 for pneumonia, HCAP.  The INR on admission 9/29 was 2.37 therapeutic.   INR today remains therapeutic (INR 2.45 << 2.44, goal of 2-3). Hgb/Hct low but stable. Noted chronic thrombocytopenia with plts trend down to 66 << 90. Will watch and consider discussing warfarin interruption if plts fall <50 (discussed with MD - [place])  Goal of Therapy:  INR 2-3 Monitor platelets by anticoagulation protocol: Yes   Plan:  1. Warfarin 1 mg x 1 dose at 1800 today 2. Will continue to monitor for any signs/symptoms of bleeding and will follow up with PT/INR in the a.m.  Thank you for allowing pharmacy to be a part of this patient's care.  Alycia Rossetti, PharmD, BCPS Clinical Pharmacist Pager: [phone] Clinical phone for 09/08/2017 from 7a-3:30p: [phone] If after 3:30p, please call main pharmacy at: [phone] 09/08/2017 3:18 PM

## 2017-09-08 NOTE — Evaluation (Signed)
Clinical/Bedside Swallow Evaluation Patient Details  Name: [name] MRN: [id number] Date of Birth: 1956/11/03  Today's Date: 09/08/2017 Time: SLP Start Time (ACUTE ONLY): 9678 SLP Stop Time (ACUTE ONLY): 0837 SLP Time Calculation (min) (ACUTE ONLY): 13 min  Past Medical History:  Past Medical History:  Diagnosis Date  . Adenocarcinoma of rectum ([place])    a. 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 AICD (automatic cardioverter/defibrillator) present 2002   BI V ICD  . Anemia   . CAD (coronary artery disease)    a. BMS to LAD 2001 at [facility] b. PTCA/atherectomy ramus and BMS to LAD 2009  . CHF (congestive heart failure) ([place])   . Cholelithiasis 06/2015  . Chronic kidney disease, stage IV (severe) ([place])   . Chronic systolic heart failure ([place])   . Cirrhosis ([place])   . Colostomy in place [facility])   . Dizziness    a. chronic. Admission for this 07/18/2014  . DM type 2, uncontrolled, with renal complications ([place])   . Dysrhythmia   . Essential hypertension, benign   . GERD (gastroesophageal reflux disease)   . HCAP (healthcare-associated pneumonia) 07/21/2015  . Hematuria   . History of blood transfusion    "I've had 2 units so far this year" (09/27/2015)  . HLD (hyperlipidemia)   . Ischemic cardiomyopathy    EF 18% by nuclear study 2016, multiple myocardial infarctions in past    . Myocardial infarction ([place]) 2001  . Obesity   . Orthostatic hypotension   . OSA on CPAP   . Paroxysmal atrial fibrillation ([facility])    a. on amiodarone, digoxin and Eliquis  . PONV (postoperative nausea and vomiting)   . Presence of permanent cardiac pacemaker   . Prostate cancer ([place])    a. s/p seed implants with chemo and radiation  . SAH (subarachnoid hemorrhage) ([place])    post-traumatic (fall) [facility] 12/2014  . TIA (transient ischemic attack)    Past Surgical History:  Past Surgical History:  Procedure Laterality Date  . Abdominal and Perineal Resection of Rectum with Total Mesorectal Excision  10/04/2007  . AV FISTULA  PLACEMENT Right 09/16/2015   Procedure: ARTERIOVENOUS (AV) FISTULA CREATION - BRACHIOCEPHALIC;  Surgeon: Elam Dutch, MD;  Location: [facility];  Service: Vascular;  Laterality: Right;  . BI-VENTRICULAR IMPLANTABLE CARDIOVERTER DEFIBRILLATOR  (CRT-D)  2009  . CARDIAC CATHETERIZATION  08/2001; 2009   ; [name] 07/10/2013  . CARDIAC DEFIBRILLATOR PLACEMENT  2002  . CARDIAC DEFIBRILLATOR PLACEMENT  2009   Upgraded to a BiV ICD  . COLONOSCOPY  09/14/2011   Dr. Gala Romney: via colostomy, Single pedunculated benign inflammatory polyp. Due for surveillance Oct 2015  . COLONOSCOPY N/A 07/02/2014   Procedure: COLONOSCOPY;  Surgeon: Daneil Dolin, MD;  [facility];  Service: Endoscopy;  Laterality: N/A;  7:30 / COLONOSCOPY THRU COLOSTOMY  . COLONOSCOPY N/A 12/11/2014   Dr. Gala Romney via colostomy. Normal. Repeat in 2021.   Marland Kitchen COLONOSCOPY N/A 08/24/2015   Dr. Havery Moros: diminutive polyp not removed, stoma site of bleeding  . COLOSTOMY  2008  . CORONARY ANGIOPLASTY WITH STENT PLACEMENT  2001; ~ 2006   "1 + 1"   . ELECTROPHYSIOLOGIC STUDY N/A 08/28/2015   Procedure: AV Node Ablation;  Surgeon: Will Meredith Leeds, MD;  Location: [place] CV LAB;  Service: Cardiovascular;  Laterality: N/A;  . EP IMPLANTABLE DEVICE N/A 04/10/2015   Procedure: Ppm/Biv Ppm Generator Changeout;  Surgeon: Evans Lance, MD;  Location: [place] CV LAB CUPID;  Service: Cardiovascular;  Laterality: N/A;  .  ERCP N/A 06/11/2016   Procedure: ENDOSCOPIC RETROGRADE CHOLANGIOPANCREATOGRAPHY (ERCP);  Surgeon: Teena Irani, MD;  Location: [facility] ENDOSCOPY;  Service: Endoscopy;  Laterality: N/A;  . ERCP N/A 01/26/2017   Procedure: ENDOSCOPIC RETROGRADE CHOLANGIOPANCREATOGRAPHY (ERCP);  Surgeon: Teena Irani, MD;  Location: [facility] ENDOSCOPY;  Service: Endoscopy;  Laterality: N/A;  . ERCP  06/2016   Dr. Amedeo Plenty: duodenal fistula, dilated bile duct but no obvious stones, s/p sphincterotomy and stent placement  . ERCP  01/2017   Dr. Amedeo Plenty: CBD stones, s/p  sphincterotomy, balloon extraction, stent removal  . ESOPHAGOGASTRODUODENOSCOPY N/A 07/02/2014   Procedure: ESOPHAGOGASTRODUODENOSCOPY (EGD);  Surgeon: Daneil Dolin, MD;  [facility];  Service: Endoscopy;  Laterality: N/A;  7:30  . ESOPHAGOGASTRODUODENOSCOPY N/A 12/11/2014   OZH:[id number] EGD  . ESOPHAGOGASTRODUODENOSCOPY (EGD) WITH PROPOFOL N/A 04/18/2017   Procedure: ESOPHAGOGASTRODUODENOSCOPY (EGD) WITH PROPOFOL;  Surgeon: Daneil Dolin, MD;  [facility];  Service: Endoscopy;  Laterality: N/A;  2:30pm  . GASTROINTESTINAL STENT REMOVAL N/A 01/26/2017   Procedure: GASTROINTESTINAL STENT REMOVAL;  Surgeon: Teena Irani, MD;  Location: [place];  Service: Endoscopy;  Laterality: N/A;  . GIVENS CAPSULE STUDY N/A 07/23/2015   Dr. Michail Sermon: small non-bleeding AVM, mild gastritis  . IR FLUORO GUIDE CV LINE RIGHT  07/27/2017  . IR REMOVAL TUN CV CATH W/O FL  08/12/2017  . IR US GUIDE VASC ACCESS RIGHT  07/27/2017  . LEFT HEART CATHETERIZATION WITH CORONARY ANGIOGRAM N/A 07/13/2013   Procedure: LEFT HEART CATHETERIZATION WITH CORONARY ANGIOGRAM;  Surgeon: Lorretta Harp, MD;  Location: [facility] CATH LAB;  Service: Cardiovascular;  Laterality: N/A;  . Venia Minks DILATION N/A 07/02/2014   Procedure: Venia Minks DILATION;  Surgeon: Daneil Dolin, MD;  [facility];  Service: Endoscopy;  Laterality: N/A;  7:30  . MALONEY DILATION N/A 04/18/2017   Procedure: Venia Minks DILATION;  Surgeon: Daneil Dolin, MD;  [facility];  Service: Endoscopy;  Laterality: N/A;  . PORTACATH PLACEMENT  06/2007   "removed ~ 1 yr later"  . RIGHT HEART CATHETERIZATION N/A 02/24/2015   Procedure: RIGHT HEART CATH;  Surgeon: Jolaine Artist, MD;  Location: [facility] CATH LAB;  Service: Cardiovascular;  Laterality: N/A;  . SAVORY DILATION N/A 07/02/2014   Procedure: SAVORY DILATION;  Surgeon: Daneil Dolin, MD;  [facility];  Service: Endoscopy;  Laterality: N/A;  7:30   HPI:  Barrett C Ashleyis  a 61 y.o.male,w Dm2, SAH, Pafib, Mild MR, Mild AR, CAD, CHF (EF 20%) ESRD on HD (M,W, F), apparently c/o cough with yellow sputum and mild hemoptysis. Hewas found to have health care associated pneumonia. Admitted for the management of hcap. Most recent chest xray (2 view) shows small to moderate layering right pleural effusion and associated right lower lobe obacity, likely atelectasis.    Assessment / Plan / Recommendation Clinical Impression  Pt demonstrates swallowing WNL; tolerated thin, puree and solid consistencies with no s/sx of aspiration despite being challenged with consecutive sips of thin liquid via straw. Coughing at baseline 2/2 HCAP. No SLP f/u needed. Will sign off.    SLP Visit Diagnosis: Dysphagia, unspecified (R13.10)    Aspiration Risk  No limitations    Diet Recommendation Regular;Thin liquid (Heart)   Liquid Administration via: Cup;Straw Medication Administration: Whole meds with liquid Supervision: Patient able to self feed Compensations: Small sips/bites;Follow solids with liquid Postural Changes: Seated upright at 90 degrees    Other  Recommendations Oral Care Recommendations: Oral care BID   Follow up  Recommendations None      Frequency and Duration            Prognosis Prognosis for Safe Diet Advancement: Good      Swallow Study   General HPI: Wendelin C Ashleyis a 61 y.o.male,w Dm2, SAH, Pafib, Mild MR, Mild AR, CAD, CHF (EF 20%) ESRD on HD (M,W, F), apparently c/o cough with yellow sputum and mild hemoptysis. Hewas found to have health care associated pneumonia. Admitted for the management of hcap. Most recent chest xray (2 view) shows small to moderate layering right pleural effusion and associated right lower lobe obacity, likely atelectasis.  Type of Study: Bedside Swallow Evaluation Diet Prior to this Study: Regular;Thin liquids (Heart) Temperature Spikes Noted: No Respiratory Status: Room air History of Recent Intubation:  No Behavior/Cognition: Alert;Cooperative Oral Cavity Assessment: Within Functional Limits Oral Care Completed by SLP: No Oral Cavity - Dentition: Poor condition Vision: Functional for self-feeding Self-Feeding Abilities: Able to feed self Patient Positioning: Upright in bed Baseline Vocal Quality: Normal Volitional Cough: Strong    Oral/Motor/Sensory Function Overall Oral Motor/Sensory Function: Within functional limits   Ice Chips Ice chips: Not tested   Thin Liquid Thin Liquid: Within functional limits    Nectar Thick Nectar Thick Liquid: Not tested   Honey Thick Honey Thick Liquid: Not tested   Puree Puree: Within functional limits   Solid   GO   Solid: Within functional limits        Aaron Edelman, Student SLP 09/08/2017,8:53 AM

## 2017-09-08 NOTE — Care Management Note (Signed)
Case Management Note  Patient Details  Name: [name] MRN: [id number] Date of Birth: 1956-11-16  Subjective/Objective:       CM following for progression and d/c planning.              Action/Plan: 09/08/2017 Pt discussed in [facility], recommendation pre CM medical director that this pt be considered for LTAC. This CM referred to [facility]. Per [facility] they are unable to make a bed offer as this pt has not had an ICU stay. Per [facility] they most likely will not be able to make an offer. Will check back tomorrow . Discussed with [place] RN who suggest ongoing daily hydrotherapy and hopefully place a VAC the first of the week if pt is unable to go to [place].  Pt is very weak with SOB at intervals, this CM concerned for family ability to provide care at home and travel to outpatient HD.  Will continue to follow. Dr Teryl Lucy.  Expected Discharge Date:                  Expected Discharge Plan:  Long Term Acute Care (LTAC)  In-House Referral:  NA  Discharge planning Services  CM Consult  Post Acute Care Choice:    Choice offered to:     DME Arranged:    DME Agency:     HH Arranged:    HH Agency:     Status of Service:  In process, will continue to follow  If discussed at Long Length of Stay Meetings, dates discussed:    Additional Comments:  Adron Bene, RN 09/08/2017, 4:22 PM

## 2017-09-08 NOTE — Progress Notes (Signed)
PROGRESS NOTE    [name]  SWN:[id number] DOB: 03-22-1956 DOA: 09/03/2017 PCP: Redmond School, MD   Brief Narrative: [name] is a 61 y.o. male,w Dm2, SAH, Pafib, cirrhosis, Mild MR, Mild AR, CAD, CHF (EF 20%) ESRD on HD (M,W, F), apparently c/o cough with yellow sputum and mild hemoptysis. He was found to have health care associated pneumonia. Admitted for the management of hcap.    Assessment & Plan:   Principal Problem:   HCAP (healthcare-associated pneumonia) Active Problems:   DM type 2, uncontrolled, with renal complications (HCC)   Abnormal LFTs   Hyponatremia   Hemoptysis   Status post thoracentesis   Colostomy in place [facility])   ESRD on dialysis ([name])   Malnutrition of moderate degree   HCAP Patient treated with vancomycin and cefepime. Blood cultures without growth. No sputum culture sent on admission. Concern for worsening infiltrate, however, patient has been afebrile and with a normal WBC. -sputum gram stain/culture -continue cefepime  Pleural effusion Appears to likely be transudate. Possibly from heart failure. Asymptomatic. Possible re-accumulation on last chest x-ray.  Sacral decubitus ulcer Unstageable. Afebrile. -hydrotherapy -wound care recommendations: plan for wound vac early next week  Diabetes mellitus -continue SSI  Hypotension Chronic. -continue midodrine  Hemoptysis Resolved. Secondary to HCAP and frequent coughing.  ESRD on hemodialysis -nephrology recommendations  Thrombocytopenia Stable. Chronic.  Leukocytosis Resolved.  Hyponatremia Stable  Atrial fibrillation -continue Coumadin  Cirrhosis Bilirubin stable. Outpatient follow-up  History of rectal carcinoma S/p diverting colostomy. Stable.   DVT prophylaxis: Coumadin Code Status: Full code Family Communication: Wife at bedside Disposition Plan: Discharge to [facility]   Consultants:   Nephrology  Procedures:    Hemodialysis  Antimicrobials:  Vancomycin (9/28>>10/3)  Cefepime (9/29>>   Subjective: Productive cough. Afebrile. No hemoptysis.  Objective: Vitals:   09/07/17 1811 09/07/17 2135 09/08/17 0444 09/08/17 0941  BP: (!) 97/49 98/64 (!) 102/51 (!) 100/45  Pulse: 77 69 73 80  Resp: 17 16 16 18   Temp: (!) 97.5 F (36.4 C) (!) 97.5 F (36.4 C) 97.6 F (36.4 C) (!) 97.4 F (36.3 C)  TempSrc: Oral Oral Oral Oral  SpO2: 98% 94% 99% 99%  Weight: 71.2 kg (157 lb)     Height:        Intake/Output Summary (Last 24 hours) at 09/08/17 1617 Last data filed at 09/08/17 1500  Gross per 24 hour  Intake              350 ml  Output             1025 ml  Net             -675 ml   Filed Weights   09/05/17 1158 09/07/17 1429 09/07/17 1811  Weight: 70 kg (154 lb 5.2 oz) 71.7 kg (158 lb) 71.2 kg (157 lb)    Examination:  General exam: Appears calm and comfortable Respiratory system: Clear to auscultation. Respiratory effort normal. Cardiovascular system: S1 & S2 heard, RRR. No murmurs Gastrointestinal system: Abdomen is nondistended, soft and nontender. Normal bowel sounds heard. Colostomy. Central nervous system: Alert and oriented. Extremities: No edema. No calf tenderness Skin: No cyanosis. No rashes. Jaundice. Psychiatry: Judgement and insight appear normal. Mood & affect appropriate.     Data Reviewed: I have personally reviewed following labs and imaging studies  CBC:  Recent Labs Lab 09/03/17 0323  09/05/17 0329 09/06/17 0353 09/06/17 0503 09/07/17 0256 09/07/17 1330 09/08/17 0225  WBC 15.9*  < > 8.5  7.4  --  7.3 6.2 6.8  NEUTROABS 14.3*  --   --   --   --   --   --   --   HGB 10.3*  < > 10.5* 10.8*  --  11.5* 10.2* 11.5*  HCT 32.5*  < > 33.3* 34.3* 36.5* 35.4* 31.2* 36.1*  MCV 101.6*  < > 99.4 100.6*  --  99.4 98.4 100.3*  PLT 113*  < > 91* 83*  --  90* 65* 66*  < > = values in this interval not displayed. Basic Metabolic Panel:  Recent Labs Lab  09/03/17 0323 09/04/17 0256 09/05/17 0800 09/07/17 1330  NA 134* 133* 130* 127*  K 4.3 4.2 3.6 3.3*  CL 96* 96* 93* 92*  CO2 25 21* 24 25  GLUCOSE 147* 109* 112* 167*  BUN 16 26* 22* 35*  CREATININE 2.76* 3.53* 2.62* 3.27*  CALCIUM 8.7* 8.8* 8.1* 8.2*  PHOS  --   --  2.5 2.6   GFR: Estimated Creatinine Clearance: 23.6 mL/min (A) (by C-G formula based on SCr of 3.27 mg/dL (H)). Liver Function Tests:  Recent Labs Lab 09/03/17 0323 09/04/17 0256 09/05/17 0800 09/07/17 1330  AST 53* 45*  --   --   ALT 14* 14*  --   --   ALKPHOS 116 98  --   --   BILITOT 3.2* 3.3*  --   --   PROT 6.7 6.7  --   --   ALBUMIN 2.3* 2.3* 2.3* 1.9*   No results for input(s): LIPASE, AMYLASE in the last 168 hours. No results for input(s): AMMONIA in the last 168 hours. Coagulation Profile:  Recent Labs Lab 09/04/17 0256 09/05/17 0329 09/06/17 0353 09/07/17 0256 09/08/17 0225  INR 2.28 2.30 2.28 2.44 2.45   Cardiac Enzymes:  Recent Labs Lab 09/03/17 0856 09/03/17 1240 09/03/17 1829  TROPONINI 0.12* 0.05* 0.06*   BNP (last 3 results) No results for input(s): PROBNP in the last 8760 hours. HbA1C: No results for input(s): HGBA1C in the last 72 hours. CBG:  Recent Labs Lab 09/07/17 1217 09/07/17 1917 09/07/17 2132 09/08/17 0827 09/08/17 1220  GLUCAP 148* 92 151* 122* 165*   Lipid Profile: No results for input(s): CHOL, HDL, LDLCALC, TRIG, CHOLHDL, LDLDIRECT in the last 72 hours. Thyroid Function Tests: No results for input(s): TSH, T4TOTAL, FREET4, T3FREE, THYROIDAB in the last 72 hours. Anemia Panel:  Recent Labs  09/06/17 0353  VITAMINB12 1,296*   Sepsis Labs: No results for input(s): PROCALCITON, LATICACIDVEN in the last 168 hours.  Recent Results (from the past 240 hour(s))  Culture, blood (routine x 2) Call MD if unable to obtain prior to antibiotics being given     Status: None   Collection Time: 09/03/17  8:56 AM  Result Value Ref Range Status   Specimen  Description BLOOD BLOOD LEFT HAND  Final   Special Requests IN PEDIATRIC BOTTLE Blood Culture adequate volume  Final   Culture NO GROWTH 5 DAYS  Final   Report Status 09/08/2017 FINAL  Final  Culture, blood (routine x 2) Call MD if unable to obtain prior to antibiotics being given     Status: None   Collection Time: 09/03/17  9:00 AM  Result Value Ref Range Status   Specimen Description BLOOD BLOOD LEFT HAND  Final   Special Requests IN PEDIATRIC BOTTLE Blood Culture adequate volume  Final   Culture NO GROWTH 5 DAYS  Final   Report Status 09/08/2017 FINAL  Final  Respiratory  Panel by PCR     Status: None   Collection Time: 09/03/17  5:32 PM  Result Value Ref Range Status   Adenovirus NOT DETECTED NOT DETECTED Final   Coronavirus 229E NOT DETECTED NOT DETECTED Final   Coronavirus HKU1 NOT DETECTED NOT DETECTED Final   Coronavirus NL63 NOT DETECTED NOT DETECTED Final   Coronavirus OC43 NOT DETECTED NOT DETECTED Final   Metapneumovirus NOT DETECTED NOT DETECTED Final   Rhinovirus / Enterovirus NOT DETECTED NOT DETECTED Final   Influenza A NOT DETECTED NOT DETECTED Final   Influenza B NOT DETECTED NOT DETECTED Final   Parainfluenza Virus 1 NOT DETECTED NOT DETECTED Final   Parainfluenza Virus 2 NOT DETECTED NOT DETECTED Final   Parainfluenza Virus 3 NOT DETECTED NOT DETECTED Final   Parainfluenza Virus 4 NOT DETECTED NOT DETECTED Final   Respiratory Syncytial Virus NOT DETECTED NOT DETECTED Final   Bordetella pertussis NOT DETECTED NOT DETECTED Final   Chlamydophila pneumoniae NOT DETECTED NOT DETECTED Final   Mycoplasma pneumoniae NOT DETECTED NOT DETECTED Final  MRSA PCR Screening     Status: None   Collection Time: 09/03/17  5:32 PM  Result Value Ref Range Status   MRSA by PCR NEGATIVE NEGATIVE Final    Comment:        The GeneXpert MRSA Assay (FDA approved for NASAL specimens only), is one component of a comprehensive MRSA colonization surveillance program. It is not intended  to diagnose MRSA infection nor to guide or monitor treatment for MRSA infections.   Culture, body fluid-bottle     Status: None (Preliminary result)   Collection Time: 09/04/17  2:00 PM  Result Value Ref Range Status   Specimen Description PLEURAL  Final   Special Requests NONE  Final   Culture NO GROWTH 4 DAYS  Final   Report Status PENDING  Incomplete  Gram stain     Status: None   Collection Time: 09/04/17  2:00 PM  Result Value Ref Range Status   Specimen Description PLEURAL  Final   Special Requests NONE  Final   Gram Stain   Final    WBC PRESENT, PREDOMINANTLY PMN NO ORGANISMS SEEN CYTOSPIN SMEAR    Report Status 09/04/2017 FINAL  Final         Radiology Studies: Dg Chest 2 View  Result Date: 09/08/2017 CLINICAL DATA:  Pleural effusion EXAM: CHEST  2 VIEW COMPARISON:  09/06/2017 FINDINGS: Small to moderate layering right pleural effusion. Associated right lower lobe opacity, likely atelectasis. Left lung is clear. No pneumothorax. Cardiomegaly.  Left chest ICD. Mild degenerative changes of the visualized thoracolumbar spine. IMPRESSION: Small to moderate layering right pleural effusion. Associated right lower lobe opacity, likely atelectasis. Electronically Signed   By: Julian Hy M.D.   On: 09/08/2017 07:46        Scheduled Meds: . colchicine  0.3 mg Oral Daily  . collagenase   Topical Daily  . darbepoetin (ARANESP) injection - DIALYSIS  60 mcg Intravenous Q Wed-HD  . feeding supplement (NEPRO CARB STEADY)  237 mL Oral BID BM  . feeding supplement (PRO-STAT SUGAR FREE 64)  30 mL Oral BID WC  . guaiFENesin  1,200 mg Oral BID  . insulin aspart  0-5 Units Subcutaneous QHS  . insulin aspart  0-9 Units Subcutaneous TID WC  . loratadine  10 mg Oral Daily  . midodrine  10 mg Oral TID WC  . multivitamin  1 tablet Oral QHS  . pantoprazole  40 mg Oral Daily  .  sodium chloride flush  3 mL Intravenous Q12H  . sucroferric oxyhydroxide  500 mg Oral TID WC  .  warfarin  1 mg Oral ONCE-1800  . Warfarin - Pharmacist Dosing Inpatient   Does not apply q1800   Continuous Infusions: . sodium chloride    . ceFEPime (MAXIPIME) IV Stopped (09/07/17 2217)     LOS: 5 days     Cordelia Poche, MD [facility] 09/08/2017, 4:17 PM Pager: [phone]  If 7PM-7AM, please contact night-coverage [email or web address] Password [email or web address] 09/08/2017, 4:17 PM

## 2017-09-08 NOTE — Evaluation (Signed)
Occupational Therapy Evaluation Patient Details Name: [name] MRN: [id number] DOB: 1956/11/22 Today's Date: 09/08/2017    History of Present Illness  [name] is a 61 y.o. male with ESRD, HD initiated 07/2017 during recent [facility] admit with MSSA bacteremia/shock. PMH includes  HTN, Type 2 DM, Hx Rectal cancer (s/p colostomy 2008), Afib (on warfarin), CAD (s/p stents), ischemic CM (EF 15-20%, ICD in place), OSA on CPAP, cirrhosis, thrombocytopenia. Admitted with productive cough and hemoptysis; suspicious for pneumonia; order for PT Hydro to follow for sacral ulcer.   Clinical Impression   PTA Pt independent in mobility and ADL. Pt is currently mod A for ADL and min A for sit to stand transfers. Pt extremely sleepy this session (suspect medication), so limited eval only performing sit to stand and bed level grooming. Pt demonstrating deficits (see OT problem list) and will require skilled OT in the acute setting prior to discharge to SNF to maximize safety and independence in ADL and functional transfers. Next session to focus on safety education and standing balance during grooming, increased independence during functional transfers.    Follow Up Recommendations  SNF;Supervision/Assistance - 24 hour (Per Pt he is going to a SNF that specializes in wound care)    Equipment Recommendations  Other (comment) (defer to next venue)    Recommendations for Other Services       Precautions / Restrictions Precautions Precautions: Fall Precaution Comments: sacral ulcer Restrictions Weight Bearing Restrictions: No      Mobility Bed Mobility Overal bed mobility: Needs Assistance Bed Mobility: Rolling;Sidelying to Sit Rolling: Min assist Sidelying to sit: Mod assist       General bed mobility comments: increased time required, vc for sequencing, mod A for trunk elevation  Transfers Overall transfer level: Needs assistance Equipment used: Rolling walker (2 wheeled) Transfers:  Sit to/from Stand Sit to Stand: Min assist         General transfer comment: Min assist to steady; cues for hand placement and safety    Balance Overall balance assessment: Needs assistance Sitting-balance support: Bilateral upper extremity supported;Single extremity supported;Feet supported Sitting balance-Leahy Scale: Good Sitting balance - Comments: uncomfortable due to wound   Standing balance support: Bilateral upper extremity supported;During functional activity Standing balance-Leahy Scale: Poor Standing balance comment: reliant on RW                            ADL either performed or assessed with clinical judgement   ADL Overall ADL's : Needs assistance/impaired Eating/Feeding: Modified independent;Sitting   Grooming: Wash/dry hands;Wash/dry face;Bed level;Set up Grooming Details (indicate cue type and reason): done prior to bed mobility to increase alertness Upper Body Bathing: Moderate assistance   Lower Body Bathing: Moderate assistance   Upper Body Dressing : Minimal assistance   Lower Body Dressing: Maximal assistance   Toilet Transfer: Minimal assistance;BSC;RW Toilet Transfer Details (indicate cue type and reason): simulated through sit to stand this session Toileting- Clothing Manipulation and Hygiene: Maximal assistance Toileting - Clothing Manipulation Details (indicate cue type and reason): pt reliant on BUE support this session     Functional mobility during ADLs: Minimal assistance;Rolling walker (sit to stand only this session) General ADL Comments: sleepyness impacting ability to participate this session (pre-medicated)     Vision Baseline Vision/History: Wears glasses Wears Glasses: Reading only Patient Visual Report: No change from baseline       Perception     Praxis  Pertinent Vitals/Pain Pain Assessment: Faces Faces Pain Scale: Hurts even more Pain Location: sacrum Pain Descriptors / Indicators:  Discomfort;Grimacing;Sore Pain Intervention(s): Monitored during session;Repositioned     Hand Dominance Right   Extremity/Trunk Assessment Upper Extremity Assessment Upper Extremity Assessment: Overall WFL for tasks assessed   Lower Extremity Assessment Lower Extremity Assessment: Defer to PT evaluation       Communication Communication Communication: No difficulties   Cognition Arousal/Alertness: Suspect due to medications ("I just took 2 pain pills") Behavior During Therapy: WFL for tasks assessed/performed Overall Cognitive Status: Impaired/Different from baseline Area of Impairment: Safety/judgement                         Safety/Judgement: Decreased awareness of safety;Decreased awareness of deficits     General Comments: no familiy present during session to determine baseline   General Comments  sit to stand only and then returned to bed due to lethargy suspect for medication    Exercises     Shoulder Instructions      Home Living Family/patient expects to be discharged to:: Private residence Living Arrangements: Spouse/significant other Available Help at Discharge: Family;Available 24 hours/day Type of Home: House Home Access: Stairs to enter [employer] of Steps: 1 Entrance Stairs-Rails: None Home Layout: One level     Bathroom Shower/Tub: Occupational psychologist: Standard Bathroom Accessibility: Yes How Accessible: Accessible via walker Home Equipment: [employer] - 4 wheels;Walker - standard;Tub bench (might be able to find more - was his mother's DME)      Lives With: Spouse    Prior Functioning/Environment Level of Independence: Independent        Comments: enjoyed fishing, hunting, picking up grandkids from school        OT Problem List: Decreased strength;Decreased activity tolerance;Impaired balance (sitting and/or standing);Decreased safety awareness;Pain      OT Treatment/Interventions: Self-care/ADL  training;Energy conservation;DME and/or AE instruction;Therapeutic activities;Patient/family education;Balance training    OT Goals(Current goals can be found in the care plan section) Acute Rehab OT Goals Patient Stated Goal: get back to normal OT Goal Formulation: With patient Time For Goal Achievement: 09/22/17 Potential to Achieve Goals: Fair ADL Goals Pt Will Perform Grooming: with min guard assist;standing Pt Will Transfer to Toilet: with supervision;ambulating (with RW; caregiver independent in assisting) Pt Will Perform Toileting - Clothing Manipulation and hygiene: with supervision;sit to/from stand;with caregiver independent in assisting Additional ADL Goal #1: Pt will perform bed mobility at supervision level as precursor to participation in ADL activity  OT Frequency: Min 2X/week   Barriers to D/C:            Co-evaluation              AM-PAC PT "6 Clicks" Daily Activity     Outcome Measure Help from another person eating meals?: None Help from another person taking care of personal grooming?: None Help from another person toileting, which includes using toliet, bedpan, or urinal?: A Little Help from another person bathing (including washing, rinsing, drying)?: A Little Help from another person to put on and taking off regular upper body clothing?: A Little Help from another person to put on and taking off regular lower body clothing?: A Lot 6 Click Score: 19   End of Session Equipment Utilized During Treatment: Gait belt;Rolling walker;Oxygen Nurse Communication: Mobility status  Activity Tolerance: Patient limited by lethargy Patient left: in bed;with call bell/phone within reach;with bed alarm set  OT Visit Diagnosis: Unsteadiness on feet (  R26.81);Other abnormalities of gait and mobility (R26.89);Muscle weakness (generalized) (M62.81);Pain Pain - Right/Left: Right Pain - part of body: Leg (butt)                Time: 3291-9166 OT Time Calculation (min): 14  min Charges:  OT General Charges $OT Visit: 1 Visit OT Evaluation $OT Eval Moderate Complexity: 1 Mod G-Codes:     Hulda Humphrey OTR/L [place] 09/08/2017, 3:23 PM

## 2017-09-08 NOTE — Progress Notes (Signed)
Physical Therapy Wound Treatment Patient Details  Name: [name] MRN: [id number] Date of Birth: [birth year]  Today's Date: 09/08/2017 Time: 1025-1058 Time Calculation (min): 33 min  Subjective  Patient and Family Stated Goals: healed up so I can sit without pain.  Pain Score: Pain Score: 5   Wound Assessment  Pressure Injury 08/02/17 Unstageable - Full thickness tissue loss in which the base of the ulcer is covered by slough (yellow, tan, gray, green or brown) and/or eschar (tan, brown or black) in the wound bed. present on admission to rehab (Active)  Dressing Type ABD;Barrier Film (skin prep);Gauze (Comment);Moist to dry 09/08/2017  2:33 PM  Dressing Clean;Dry;Intact 09/08/2017  2:33 PM  Dressing Change Frequency Daily 09/08/2017  2:33 PM  State of Healing Eschar 09/08/2017  2:33 PM  Site / Wound Assessment Yellow;Pink 09/08/2017  2:33 PM  % Wound base Red or Granulating 50% 09/08/2017  2:33 PM  % Wound base Yellow/Fibrinous Exudate 50% 09/08/2017  2:33 PM  % Wound base Black/Eschar 0% 09/08/2017  2:33 PM  % Wound base Other/Granulation Tissue (Comment) 0% 09/08/2017  2:33 PM  Peri-wound Assessment Erythema (blanchable);Intact 09/08/2017  2:33 PM  Wound Length (cm) 7 cm 09/06/2017 11:24 AM  Wound Width (cm) 5 cm 09/06/2017 11:24 AM  Wound Depth (cm) 2.5 cm 09/06/2017 11:24 AM  Wound Surface Area (cm^2) 35 cm^2 09/06/2017 11:24 AM  Wound Volume (cm^3) 87.5 cm^3 09/06/2017 11:24 AM  Margins Unattached edges (unapproximated) 09/08/2017  2:33 PM  Drainage Amount Minimal 09/08/2017  2:33 PM  Drainage Description Serous 09/08/2017  2:33 PM  Treatment Cleansed;Debridement (Selective);Hydrotherapy (Pulse lavage);Packing (Saline gauze) 09/08/2017  2:33 PM   Santyl applied to wound bed prior to applying dressing.    Hydrotherapy Pulsed lavage therapy - wound location: Sacral Pulsed Lavage with Suction (psi): 8 psi (to 12) Pulsed Lavage with Suction - Normal Saline Used: 1000 mL Pulsed Lavage Tip:  Tip with splash shield Selective Debridement Selective Debridement - Location: sacral Selective Debridement - Tools Used: Forceps;Scissors;Scalpel Selective Debridement - Tissue Removed: Thick yellow eschar/slough   Wound Assessment and Plan  Wound Therapy - Assess/Plan/Recommendations Wound Therapy - Clinical Statement: Pt can definitely benefit from PLS to decrease bacterial load, cleanse and further soften tissues for selective debridement.  As mobility improves, do not think this wound will continue to provide pt significant problems Wound Therapy - Functional Problem List: Decrease mobility (see PT notes),  Factors Delaying/Impairing Wound Healing: Diabetes Mellitus;Immobility;Multiple medical problems;Other (comment) Hydrotherapy Plan: Debridement;Dressing change;Patient/family education;Pulsatile lavage with suction Wound Therapy - Frequency: 6X / week Wound Therapy - Current Recommendations: Case manager/social work;PT Wound Therapy - Follow Up Recommendations: Home health RN Wound Plan: see above  Wound Therapy Goals- Improve the function of patient's integumentary system by progressing the wound(s) through the phases of wound healing (inflammation - proliferation - remodeling) by: Decrease Necrotic Tissue to: 10% Decrease Necrotic Tissue - Progress: Progressing toward goal Increase Granulation Tissue to: 90% Increase Granulation Tissue - Progress: Progressing toward goal Goals/treatment plan/discharge plan were made with and agreed upon by patient/family: Yes Time For Goal Achievement: 7 days Wound Therapy - Potential for Goals: Good  Goals will be updated until maximal potential achieved or discharge criteria met.  Discharge criteria: when goals achieved, discharge from hospital, MD decision/surgical intervention, no progress towards goals, refusal/missing three consecutive treatments without notification or medical reason.  GP     [name] Amara [place] 09/08/2017, 2:34  PM

## 2017-09-09 LAB — CULTURE, BODY FLUID W GRAM STAIN -BOTTLE

## 2017-09-09 LAB — CBC
HEMATOCRIT: 35.1 % — AB (ref 39.0–52.0)
Hemoglobin: 11.1 g/dL — ABNORMAL LOW (ref 13.0–17.0)
MCH: 31.4 pg (ref 26.0–34.0)
MCHC: 31.6 g/dL (ref 30.0–36.0)
MCV: 99.4 fL (ref 78.0–100.0)
PLATELETS: 78 10*3/uL — AB (ref 150–400)
RBC: 3.53 MIL/uL — ABNORMAL LOW (ref 4.22–5.81)
RDW: 21.7 % — AB (ref 11.5–15.5)
WBC: 6.7 10*3/uL (ref 4.0–10.5)

## 2017-09-09 LAB — CULTURE, BODY FLUID-BOTTLE: CULTURE: NO GROWTH

## 2017-09-09 LAB — RENAL FUNCTION PANEL
ALBUMIN: 2.1 g/dL — AB (ref 3.5–5.0)
Anion gap: 11 (ref 5–15)
BUN: 39 mg/dL — AB (ref 6–20)
CO2: 24 mmol/L (ref 22–32)
CREATININE: 3.6 mg/dL — AB (ref 0.61–1.24)
Calcium: 8.5 mg/dL — ABNORMAL LOW (ref 8.9–10.3)
Chloride: 90 mmol/L — ABNORMAL LOW (ref 101–111)
GFR calc Af Amer: 19 mL/min — ABNORMAL LOW (ref >60)
GFR, EST NON AFRICAN AMERICAN: 17 mL/min — AB (ref >60)
GLUCOSE: 115 mg/dL — AB (ref 65–99)
PHOSPHORUS: 3.1 mg/dL (ref 2.5–4.6)
POTASSIUM: 3.9 mmol/L (ref 3.5–5.1)
Sodium: 125 mmol/L — ABNORMAL LOW (ref 135–145)

## 2017-09-09 LAB — GLUCOSE, CAPILLARY
Glucose-Capillary: 95 mg/dL (ref 65–99)
Glucose-Capillary: 208 mg/dL — ABNORMAL HIGH (ref 65–99)
Glucose-Capillary: 165 mg/dL — ABNORMAL HIGH (ref 65–99)

## 2017-09-09 LAB — PROTIME-INR
INR: 2.49
PROTHROMBIN TIME: 26.7 s — AB (ref 11.4–15.2)

## 2017-09-09 MED ORDER — SODIUM CHLORIDE 0.9 % IV SOLN: 100.0000 mL | INTRAVENOUS | Status: DC | PRN

## 2017-09-09 MED ORDER — PENTAFLUOROPROP-TETRAFLUOROETH EX AERO: 1.0000 "application " | INHALATION_SPRAY | CUTANEOUS | Status: DC | PRN

## 2017-09-09 MED ORDER — HEPARIN SODIUM (PORCINE) 1000 UNIT/ML DIALYSIS
1000.0000 [IU] | INTRAMUSCULAR | Status: DC | PRN
Start: 2017-09-09 — End: 2017-09-09

## 2017-09-09 MED ORDER — LIDOCAINE HCL (PF) 1 % IJ SOLN: 5.0000 mL | INTRAMUSCULAR | Status: DC | PRN

## 2017-09-09 MED ORDER — ALTEPLASE 2 MG IJ SOLR: 2.0000 mg | Freq: Once | INTRAMUSCULAR | Status: DC | PRN

## 2017-09-09 MED ORDER — LIDOCAINE-PRILOCAINE 2.5-2.5 % EX CREA: 1.0000 "application " | TOPICAL_CREAM | CUTANEOUS | Status: DC | PRN

## 2017-09-09 MED ORDER — WARFARIN SODIUM 1 MG PO TABS
1.0000 mg | ORAL_TABLET | Freq: Once | ORAL | Status: AC
  Administered 2017-09-09: 1 mg via ORAL
  Filled 2017-09-09: qty 1

## 2017-09-09 MED ORDER — HEPARIN SODIUM (PORCINE) 1000 UNIT/ML DIALYSIS: 20.0000 [IU]/kg | INTRAMUSCULAR | Status: DC | PRN

## 2017-09-09 NOTE — Progress Notes (Signed)
[facility] Progress Note   Subjective:  Opens eyes to verbal stimuli. No C/Os. On HD tolerating well.   Objective Vitals:   09/09/17 0730 09/09/17 0735 09/09/17 0800 09/09/17 0830  BP: 101/60 107/61 (!) 114/45 130/60  Pulse: 70 70 73 71  Resp: 18 19 18 18   Temp:      TempSrc:      SpO2: 99% 100% 99% 100%  Weight:      Height:       Physical Exam General: Chronically ill appearing male NAD Heart: regularly irreg 1/6 systolic M Lungs: CTAB Anteriorly Abdomen: Active BS Extremities: No LE edema. [name] in pressure relieving boot-necrotic area R great toe Dialysis Access: RUA AVF Cannulated at present    Additional Objective Labs: Basic Metabolic Panel:  Recent Labs Lab 09/05/17 0800 09/07/17 1330 09/09/17 0654  NA 130* 127* 125*  K 3.6 3.3* 3.9  CL 93* 92* 90*  CO2 24 25 24   GLUCOSE 112* 167* 115*  BUN 22* 35* 39*  CREATININE 2.62* 3.27* 3.60*  CALCIUM 8.1* 8.2* 8.5*  PHOS 2.5 2.6 3.1   Liver Function Tests:  Recent Labs Lab 09/03/17 0323 09/04/17 0256 09/05/17 0800 09/07/17 1330 09/09/17 0654  AST 53* 45*  --   --   --   ALT 14* 14*  --   --   --   ALKPHOS 116 98  --   --   --   BILITOT 3.2* 3.3*  --   --   --   PROT 6.7 6.7  --   --   --   ALBUMIN 2.3* 2.3* 2.3* 1.9* 2.1*   No results for input(s): LIPASE, AMYLASE in the last 168 hours. CBC:  Recent Labs Lab 09/03/17 0323  09/06/17 0353  09/07/17 0256 09/07/17 1330 09/08/17 0225 09/09/17 0344  WBC 15.9*  < > 7.4  --  7.3 6.2 6.8 6.7  NEUTROABS 14.3*  --   --   --   --   --   --   --   HGB 10.3*  < > 10.8*  --  11.5* 10.2* 11.5* 11.1*  HCT 32.5*  < > 34.3*  < > 35.4* 31.2* 36.1* 35.1*  MCV 101.6*  < > 100.6*  --  99.4 98.4 100.3* 99.4  PLT 113*  < > 83*  --  90* 65* 66* 78*  < > = values in this interval not displayed. Blood Culture    Component Value Date/Time   SDES PLEURAL 09/04/2017 1400   SDES PLEURAL 09/04/2017 1400   SPECREQUEST NONE 09/04/2017 1400   SPECREQUEST NONE 09/04/2017 1400   CULT NO GROWTH 4 DAYS 09/04/2017 1400   REPTSTATUS PENDING 09/04/2017 1400   REPTSTATUS 09/04/2017 FINAL 09/04/2017 1400    Cardiac Enzymes:  Recent Labs Lab 09/03/17 0856 09/03/17 1240 09/03/17 1829  TROPONINI 0.12* 0.05* 0.06*   CBG:  Recent Labs Lab 09/07/17 2132 09/08/17 0827 09/08/17 1220 09/08/17 1738 09/08/17 2101  GLUCAP 151* 122* 165* 219* 146*   Iron Studies: No results for input(s): IRON, TIBC, TRANSFERRIN, FERRITIN in the last 72 hours. @lablastinr3 @ Studies/Results: Dg Chest 2 View  Result Date: 09/08/2017 CLINICAL DATA:  Pleural effusion EXAM: CHEST  2 VIEW COMPARISON:  09/06/2017 FINDINGS: Small to moderate layering right pleural effusion. Associated right lower lobe opacity, likely atelectasis. Left lung is clear. No pneumothorax. Cardiomegaly.  Left chest ICD. Mild degenerative changes of the visualized thoracolumbar spine. IMPRESSION: Small to moderate layering right pleural effusion. Associated right lower lobe  opacity, likely atelectasis. Electronically Signed   By: Julian Hy M.D.   On: 09/08/2017 07:46   Medications: . sodium chloride    . sodium chloride    . sodium chloride    . ceFEPime (MAXIPIME) IV Stopped (09/07/17 2217)   . colchicine  0.3 mg Oral Daily  . collagenase   Topical Daily  . darbepoetin (ARANESP) injection - DIALYSIS  60 mcg Intravenous Q Wed-HD  . feeding supplement (NEPRO CARB STEADY)  237 mL Oral BID BM  . feeding supplement (PRO-STAT SUGAR FREE 64)  30 mL Oral BID WC  . guaiFENesin  1,200 mg Oral BID  . insulin aspart  0-5 Units Subcutaneous QHS  . insulin aspart  0-9 Units Subcutaneous TID WC  . loratadine  10 mg Oral Daily  . midodrine  10 mg Oral TID WC  . multivitamin  1 tablet Oral QHS  . pantoprazole  40 mg Oral Daily  . sodium chloride flush  3 mL Intravenous Q12H  . sucroferric oxyhydroxide  500 mg Oral TID WC  . Warfarin - Pharmacist Dosing Inpatient   Does not apply  q1800     Dialysis Orders: [place]/ [facility] MWF 4h 3K/2.5 77kg Hep none R AVF - Mircera 50 ug q 2, to start 10/3 - Venofer 50/wk  Assessment/Plan: 1. HCAP: Per CT. Was on Vanc/Cefepime -> now Cefepime alone. S/p 1.8L thora 9/30: Cx/ flu neg. 2. ESRD: Continue HD MWF sched. HD today. Will possibly be DC'd to [facility] or [facility].  3.Hypotension/volume: BP chronically low, on midodrine. Scale on bed not working. 4. Anemia: Hgb 11.1 S/p Aranesp 54mcg 10/3. 5. Secondary hyperparathyroidism:Corr Ca ok, Phos slightly low. Monitor now. Continue Velphoro. 6. Nutrition: Alb low, starting Nepro 7. CHF/CM: EF 15-20%, Hx AICD 8. Afib: On warfarin 9. DM: On insulin. 10. Dispo: Requesting transfer of dialysis centers (to [facility]). This was approved and will be starting at [facility] on discharge. Chair time 11:45a on MWF, needs to be there at 11:00am on first day for paperwork.  11. Sacral decub - stage IV, f/b WC team.  Have d/w primary MD, not ready for dc given these problems.   Rita H. Brown NP-C 09/09/2017, 8:51 AM  [facility] [phone]  Pt seen, examined and agree w A/P as above.  Kelly Splinter MD [employer] pager [phone]   09/09/2017, 11:55 AM

## 2017-09-09 NOTE — Progress Notes (Signed)
PT Cancellation Note  Patient Details Name: [name] MRN: [id number] DOB: [birth year]   Cancelled Treatment:    Reason Eval/Treat Not Completed: Fatigue/lethargy limiting ability to participate; patient too fatigued following four hours of HD earlier today.  Will attempt again another day.   Reginia Naas 09/09/2017, 2:55 PM  Magda Kiel, [place] 09/09/2017

## 2017-09-09 NOTE — Progress Notes (Signed)
Physical Therapy Wound Treatment Patient Details  Name: [name] MRN: [id number] Date of Birth: May 12, 1956  Today's Date: 09/09/2017 Time: 1017-5102 Time Calculation (min): 37 min  Subjective  Subjective: "I don't feel well today." Patient and Family Stated Goals: healed up so I can sit without pain.  Pain Score: Pain Score: 4   Wound Assessment  Pressure Injury 08/02/17 Unstageable - Full thickness tissue loss in which the base of the ulcer is covered by slough (yellow, tan, gray, green or brown) and/or eschar (tan, brown or black) in the wound bed. present on admission to rehab (Active)  Dressing Type ABD;Gauze (Comment);Moist to dry 09/09/2017  2:00 PM  Dressing Clean;Dry;Intact 09/09/2017  2:00 PM  Dressing Change Frequency Daily 09/09/2017  2:00 PM  State of Healing Eschar 09/09/2017  2:00 PM  Site / Wound Assessment Dressing in place / Unable to assess 09/09/2017  2:00 PM  % Wound base Red or Granulating 45% 09/09/2017  2:00 PM  % Wound base Yellow/Fibrinous Exudate 55% 09/09/2017  2:00 PM  % Wound base Black/Eschar 0% 09/09/2017  2:00 PM  % Wound base Other/Granulation Tissue (Comment) 0% 09/09/2017  2:00 PM  Peri-wound Assessment Erythema (blanchable);Intact 09/09/2017  2:00 PM  Wound Length (cm) 7 cm 09/06/2017 11:24 AM  Wound Width (cm) 5 cm 09/06/2017 11:24 AM  Wound Depth (cm) 2.5 cm 09/06/2017 11:24 AM  Wound Surface Area (cm^2) 35 cm^2 09/06/2017 11:24 AM  Wound Volume (cm^3) 87.5 cm^3 09/06/2017 11:24 AM  Margins Unattached edges (unapproximated) 09/09/2017  2:00 PM  Drainage Amount Moderate 09/09/2017  2:00 PM  Drainage Description Serous 09/09/2017  2:00 PM  Treatment Cleansed;Debridement (Selective);Hydrotherapy (Pulse lavage);Packing (Saline gauze);Tape changed 09/09/2017  2:00 PM   Santyl used   Hydrotherapy Pulsed lavage therapy - wound location: Sacral Pulsed Lavage with Suction (psi): 8 psi (to 12) Pulsed Lavage with Suction - Normal Saline Used: 1000 mL Pulsed  Lavage Tip: Tip with splash shield Selective Debridement Selective Debridement - Location: sacral Selective Debridement - Tools Used: Forceps;Scissors Selective Debridement - Tissue Removed: Thick yellow eschar/slough   Wound Assessment and Plan  Wound Therapy - Assess/Plan/Recommendations Wound Therapy - Clinical Statement: Pt can definitely benefit from PLS to decrease bacterial load, cleanse and further soften tissues for selective debridement.  As mobility improves, do not think this wound will continue to provide pt significant problems Wound Therapy - Functional Problem List: Decrease mobility (see PT notes),  Factors Delaying/Impairing Wound Healing: Diabetes Mellitus;Immobility;Multiple medical problems;Other (comment) Hydrotherapy Plan: Debridement;Dressing change;Patient/family education;Pulsatile lavage with suction Wound Therapy - Frequency: 6X / week Wound Therapy - Current Recommendations: Case manager/social work;PT Wound Therapy - Follow Up Recommendations: Home health RN Wound Plan: see above  Wound Therapy Goals- Improve the function of patient's integumentary system by progressing the wound(s) through the phases of wound healing (inflammation - proliferation - remodeling) by: Decrease Necrotic Tissue to: 10% Decrease Necrotic Tissue - Progress: Progressing toward goal Increase Granulation Tissue to: 90% Increase Granulation Tissue - Progress: Progressing toward goal Goals/treatment plan/discharge plan were made with and agreed upon by patient/family: Yes Time For Goal Achievement: 7 days Wound Therapy - Potential for Goals: Good  Goals will be updated until maximal potential achieved or discharge criteria met.  Discharge criteria: when goals achieved, discharge from hospital, MD decision/surgical intervention, no progress towards goals, refusal/missing three consecutive treatments without notification or medical reason.  GP     Denice Paradise 09/09/2017, 2:36 PM Renesme Kerrigan,PT Acute Rehabilitation [phone] (pager)

## 2017-09-09 NOTE — Progress Notes (Signed)
Pharmacy Antibiotic Note  [name] is a 61 y.o. male admitted on 09/03/2017 with pneumonia. Pharmacy is on board for Cefepime dosing.  Today is abx D#7 for PNA treatment. Pt with low temps and WBC wnl. Antibiotic discontinuation should be considered after today's dose since a 7d LOT would be completed.   Plan:  1. Continue Cefepime 2g on MWF @ 2000 2. Consider discontinuation of antibiotics after today's dose for a 7d LOT 3. Will continue to follow HD schedule/duration, culture results, LOT, and antibiotic de-escalation plans   Height: 5\' 10"  (177.8 cm) Weight:  (bedscale is broke.NP aware) IBW/kg (Calculated) : 73  Temp (24hrs), Avg:97.6 F (36.4 C), Min:97.5 F (36.4 C), Max:97.7 F (36.5 C)   Recent Labs Lab 09/03/17 0323 09/04/17 0256  09/05/17 0800 09/06/17 0353 09/07/17 0256 09/07/17 1330 09/08/17 0225 09/09/17 0344 09/09/17 0654  WBC 15.9* 10.5  < >  --  7.4 7.3 6.2 6.8 6.7  --   CREATININE 2.76* 3.53*  --  2.62*  --   --  3.27*  --   --  3.60*  < > = values in this interval not displayed.  Estimated Creatinine Clearance: 21.4 mL/min (A) (by C-G formula based on SCr of 3.6 mg/dL (H)).    No Known Allergies   Antimicrobials this admission:  Vanc 9/29>> 10/1 *loaded with 1500mg  Cefepime 9/29>>  Dose adjustments:  None at this time  Microbiology: 9/30 Sputum (pleura) gram stain: no organisms seen/final 9/30 bldy fluid Cx: no result 9/29 BCx: ngtd 9/29 resp panel: neg 9/29 MRSA PCR: neg  Thank you for allowing pharmacy to be a part of this patient's care.  Alycia Rossetti, PharmD, BCPS Clinical Pharmacist Pager: [phone] Clinical phone for 09/09/2017 from 7a-3:30p: [phone] If after 3:30p, please call main pharmacy at: [phone] 09/09/2017 2:45 PM

## 2017-09-09 NOTE — Progress Notes (Signed)
PROGRESS NOTE    [name]  DTO:[id number] DOB: Mar 01, 1956 DOA: 09/03/2017 PCP: Redmond School, MD   Brief Narrative: [name] is a 61 y.o. male,w Dm2, SAH, Pafib, cirrhosis, Mild MR, Mild AR, CAD, CHF (EF 20%) ESRD on HD (M,W, F), apparently c/o cough with yellow sputum and mild hemoptysis. He was found to have health care associated pneumonia. Admitted for the management of hcap.    Assessment & Plan:   Principal Problem:   HCAP (healthcare-associated pneumonia) Active Problems:   DM type 2, uncontrolled, with renal complications (HCC)   Abnormal LFTs   Hyponatremia   Hemoptysis   Status post thoracentesis   Colostomy in place [facility])   ESRD on dialysis ([place])   Malnutrition of moderate degree   HCAP Patient treated with vancomycin and cefepime. Blood cultures without growth. No sputum culture sent on admission. Concern for worsening infiltrate, however, patient has been afebrile and with a normal WBC. -sputum gram stain/culture -continue cefepime  Pleural effusion Appears to likely be transudate. Possibly from heart failure. Asymptomatic. Possible re-accumulation on last chest x-ray.  Sacral decubitus ulcer Unstageable. Afebrile. -hydrotherapy -wound care recommendations: plan for wound vac early next week  Diabetes mellitus -continue SSI  Hypotension Chronic. -continue midodrine  Hemoptysis Resolved. Secondary to HCAP and frequent coughing.  ESRD on hemodialysis -nephrology recommendations  Thrombocytopenia Stable. Chronic.  Leukocytosis Resolved.  Hyponatremia Stable  Atrial fibrillation -continue Coumadin  Cirrhosis Bilirubin stable. Outpatient follow-up  History of rectal carcinoma S/p diverting colostomy. Stable.  Right great toe ulcer Col foot. Pulses palpable. Previous ABI significant for elevated ABIs secondary to early calcification -ABI   DVT prophylaxis: Coumadin Code Status: Full code Family Communication:  None Disposition Plan: Discharge to [place]   Consultants:   Nephrology  Procedures:   Hemodialysis  Antimicrobials:  Vancomycin (9/28>>10/3)  Cefepime (9/29>>   Subjective: Productive cough. Afebrile. No hemoptysis.  Objective: Vitals:   09/09/17 0900 09/09/17 0930 09/09/17 1000 09/09/17 1030  BP: 103/61 (!) 112/55 102/60 (!) 98/54  Pulse: 72 72 70 70  Resp: 18 18 18 18   Temp:      TempSrc:      SpO2: 99% 100% 100% 100%  Weight:      Height:        Intake/Output Summary (Last 24 hours) at 09/09/17 1128 Last data filed at 09/09/17 0649  Gross per 24 hour  Intake              900 ml  Output              425 ml  Net              475 ml   Filed Weights   09/05/17 1158 09/07/17 1429 09/07/17 1811  Weight: 70 kg (154 lb 5.2 oz) 71.7 kg (158 lb) 71.2 kg (157 lb)    Examination:  General exam: Appears calm and comfortable Respiratory system: Clear to auscultation. Respiratory effort normal. Cardiovascular system: S1 & S2 heard, RRR. No murmurs Gastrointestinal system: Abdomen is nondistended, soft and nontender. Normal bowel sounds heard. Colostomy. Central nervous system: Somnolent, arouses to voice. Extremities: No edema. No calf tenderness Skin: No cyanosis. No rashes. Jaundice.    Data Reviewed: I have personally reviewed following labs and imaging studies  CBC:  Recent Labs Lab 09/03/17 0323  09/06/17 0353 09/06/17 0503 09/07/17 0256 09/07/17 1330 09/08/17 0225 09/09/17 0344  WBC 15.9*  < > 7.4  --  7.3 6.2 6.8 6.7  NEUTROABS  14.3*  --   --   --   --   --   --   --   HGB 10.3*  < > 10.8*  --  11.5* 10.2* 11.5* 11.1*  HCT 32.5*  < > 34.3* 36.5* 35.4* 31.2* 36.1* 35.1*  MCV 101.6*  < > 100.6*  --  99.4 98.4 100.3* 99.4  PLT 113*  < > 83*  --  90* 65* 66* 78*  < > = values in this interval not displayed. Basic Metabolic Panel:  Recent Labs Lab 09/03/17 0323 09/04/17 0256 09/05/17 0800 09/07/17 1330 09/09/17 0654  NA 134* 133* 130* 127*  125*  K 4.3 4.2 3.6 3.3* 3.9  CL 96* 96* 93* 92* 90*  CO2 25 21* 24 25 24   GLUCOSE 147* 109* 112* 167* 115*  BUN 16 26* 22* 35* 39*  CREATININE 2.76* 3.53* 2.62* 3.27* 3.60*  CALCIUM 8.7* 8.8* 8.1* 8.2* 8.5*  PHOS  --   --  2.5 2.6 3.1   GFR: Estimated Creatinine Clearance: 21.4 mL/min (A) (by C-G formula based on SCr of 3.6 mg/dL (H)). Liver Function Tests:  Recent Labs Lab 09/03/17 0323 09/04/17 0256 09/05/17 0800 09/07/17 1330 09/09/17 0654  AST 53* 45*  --   --   --   ALT 14* 14*  --   --   --   ALKPHOS 116 98  --   --   --   BILITOT 3.2* 3.3*  --   --   --   PROT 6.7 6.7  --   --   --   ALBUMIN 2.3* 2.3* 2.3* 1.9* 2.1*   No results for input(s): LIPASE, AMYLASE in the last 168 hours. No results for input(s): AMMONIA in the last 168 hours. Coagulation Profile:  Recent Labs Lab 09/05/17 0329 09/06/17 0353 09/07/17 0256 09/08/17 0225 09/09/17 0344  INR 2.30 2.28 2.44 2.45 2.49   Cardiac Enzymes:  Recent Labs Lab 09/03/17 0856 09/03/17 1240 09/03/17 1829  TROPONINI 0.12* 0.05* 0.06*   BNP (last 3 results) No results for input(s): PROBNP in the last 8760 hours. HbA1C: No results for input(s): HGBA1C in the last 72 hours. CBG:  Recent Labs Lab 09/07/17 2132 09/08/17 0827 09/08/17 1220 09/08/17 1738 09/08/17 2101  GLUCAP 151* 122* 165* 219* 146*   Lipid Profile: No results for input(s): CHOL, HDL, LDLCALC, TRIG, CHOLHDL, LDLDIRECT in the last 72 hours. Thyroid Function Tests: No results for input(s): TSH, T4TOTAL, FREET4, T3FREE, THYROIDAB in the last 72 hours. Anemia Panel: No results for input(s): VITAMINB12, FOLATE, FERRITIN, TIBC, IRON, RETICCTPCT in the last 72 hours. Sepsis Labs: No results for input(s): PROCALCITON, LATICACIDVEN in the last 168 hours.  Recent Results (from the past 240 hour(s))  Culture, blood (routine x 2) Call MD if unable to obtain prior to antibiotics being given     Status: None   Collection Time: 09/03/17  8:56 AM   Result Value Ref Range Status   Specimen Description BLOOD BLOOD LEFT HAND  Final   Special Requests IN PEDIATRIC BOTTLE Blood Culture adequate volume  Final   Culture NO GROWTH 5 DAYS  Final   Report Status 09/08/2017 FINAL  Final  Culture, blood (routine x 2) Call MD if unable to obtain prior to antibiotics being given     Status: None   Collection Time: 09/03/17  9:00 AM  Result Value Ref Range Status   Specimen Description BLOOD BLOOD LEFT HAND  Final   Special Requests IN PEDIATRIC BOTTLE Blood  Culture adequate volume  Final   Culture NO GROWTH 5 DAYS  Final   Report Status 09/08/2017 FINAL  Final  Respiratory Panel by PCR     Status: None   Collection Time: 09/03/17  5:32 PM  Result Value Ref Range Status   Adenovirus NOT DETECTED NOT DETECTED Final   Coronavirus 229E NOT DETECTED NOT DETECTED Final   Coronavirus HKU1 NOT DETECTED NOT DETECTED Final   Coronavirus NL63 NOT DETECTED NOT DETECTED Final   Coronavirus OC43 NOT DETECTED NOT DETECTED Final   Metapneumovirus NOT DETECTED NOT DETECTED Final   Rhinovirus / Enterovirus NOT DETECTED NOT DETECTED Final   Influenza A NOT DETECTED NOT DETECTED Final   Influenza B NOT DETECTED NOT DETECTED Final   Parainfluenza Virus 1 NOT DETECTED NOT DETECTED Final   Parainfluenza Virus 2 NOT DETECTED NOT DETECTED Final   Parainfluenza Virus 3 NOT DETECTED NOT DETECTED Final   Parainfluenza Virus 4 NOT DETECTED NOT DETECTED Final   Respiratory Syncytial Virus NOT DETECTED NOT DETECTED Final   Bordetella pertussis NOT DETECTED NOT DETECTED Final   Chlamydophila pneumoniae NOT DETECTED NOT DETECTED Final   Mycoplasma pneumoniae NOT DETECTED NOT DETECTED Final  MRSA PCR Screening     Status: None   Collection Time: 09/03/17  5:32 PM  Result Value Ref Range Status   MRSA by PCR NEGATIVE NEGATIVE Final    Comment:        The GeneXpert MRSA Assay (FDA approved for NASAL specimens only), is one component of a comprehensive MRSA  colonization surveillance program. It is not intended to diagnose MRSA infection nor to guide or monitor treatment for MRSA infections.   Culture, body fluid-bottle     Status: None (Preliminary result)   Collection Time: 09/04/17  2:00 PM  Result Value Ref Range Status   Specimen Description PLEURAL  Final   Special Requests NONE  Final   Culture NO GROWTH 4 DAYS  Final   Report Status PENDING  Incomplete  Gram stain     Status: None   Collection Time: 09/04/17  2:00 PM  Result Value Ref Range Status   Specimen Description PLEURAL  Final   Special Requests NONE  Final   Gram Stain   Final    WBC PRESENT, PREDOMINANTLY PMN NO ORGANISMS SEEN CYTOSPIN SMEAR    Report Status 09/04/2017 FINAL  Final         Radiology Studies: Dg Chest 2 View  Result Date: 09/08/2017 CLINICAL DATA:  Pleural effusion EXAM: CHEST  2 VIEW COMPARISON:  09/06/2017 FINDINGS: Small to moderate layering right pleural effusion. Associated right lower lobe opacity, likely atelectasis. Left lung is clear. No pneumothorax. Cardiomegaly.  Left chest ICD. Mild degenerative changes of the visualized thoracolumbar spine. IMPRESSION: Small to moderate layering right pleural effusion. Associated right lower lobe opacity, likely atelectasis. Electronically Signed   By: Julian Hy M.D.   On: 09/08/2017 07:46        Scheduled Meds: . colchicine  0.3 mg Oral Daily  . collagenase   Topical Daily  . darbepoetin (ARANESP) injection - DIALYSIS  60 mcg Intravenous Q Wed-HD  . feeding supplement (NEPRO CARB STEADY)  237 mL Oral BID BM  . feeding supplement (PRO-STAT SUGAR FREE 64)  30 mL Oral BID WC  . guaiFENesin  1,200 mg Oral BID  . insulin aspart  0-5 Units Subcutaneous QHS  . insulin aspart  0-9 Units Subcutaneous TID WC  . loratadine  10 mg Oral Daily  .  midodrine  10 mg Oral TID WC  . multivitamin  1 tablet Oral QHS  . pantoprazole  40 mg Oral Daily  . sodium chloride flush  3 mL Intravenous Q12H   . sucroferric oxyhydroxide  500 mg Oral TID WC  . Warfarin - Pharmacist Dosing Inpatient   Does not apply q1800   Continuous Infusions: . sodium chloride    . sodium chloride    . sodium chloride    . ceFEPime (MAXIPIME) IV Stopped (09/07/17 2217)     LOS: 6 days     Cordelia Poche, MD [facility] 09/09/2017, 11:28 AM Pager: [phone]  If 7PM-7AM, please contact night-coverage [email or web address] Password [email or web address] 09/09/2017, 11:28 AM

## 2017-09-09 NOTE — Progress Notes (Signed)
[place] for warfarin Indication: atrial fibrillation  No Known Allergies  Patient Measurements: Height: 5\' 10"  (177.8 cm) Weight:  (bedscale is broke.NP aware) IBW/kg (Calculated) : 73  Vital Signs: Temp: 97.7 F (36.5 C) (10/05 1130) Temp Source: Oral (10/05 1130) BP: 112/44 (10/05 1130) Pulse Rate: 66 (10/05 1130)  Labs:  Recent Labs  09/07/17 0256 09/07/17 1330 09/08/17 0225 09/09/17 0344 09/09/17 0654  HGB 11.5* 10.2* 11.5* 11.1*  --   HCT 35.4* 31.2* 36.1* 35.1*  --   PLT 90* 65* 66* 78*  --   LABPROT 26.3*  --  26.4* 26.7*  --   INR 2.44  --  2.45 2.49  --   CREATININE  --  3.27*  --   --  3.60*    Estimated Creatinine Clearance: 21.4 mL/min (A) (by C-G formula based on SCr of 3.6 mg/dL (H)).   Assessment: 71 YOM with AFib on warfarin. PTA the patient had INR checked on 9/28 which resulted at 1.8 and dose was changed to 1mg  daily except 2mg  on Fridays (previously 1mg  every day). PTA dose last taken on 9/28.  He was admitted to [facility] on 09/03/17 for pneumonia, HCAP.  The INR on admission 9/29 was 2.37 therapeutic.   INR today remains therapeutic (INR 2.49 << 2.45, goal of 2-3). Hgb/Hct low but stable. Noted chronic thrombocytopenia with plts trend down to 66 on 10/4 but back up to 78 on 10/5. Will watch and consider discussing warfarin interruption if plts fall <50 (discussed with MD - [place])  Goal of Therapy:  INR 2-3 Monitor platelets by anticoagulation protocol: Yes   Plan:  1. Warfarin 1 mg x 1 dose at 1800 today 2. Will continue to monitor for any signs/symptoms of bleeding and will follow up with PT/INR in the a.m.  Thank you for allowing pharmacy to be a part of this patient's care.  Alycia Rossetti, PharmD, BCPS Clinical Pharmacist Pager: [phone] Clinical phone for 09/09/2017 from 7a-3:30p: [phone] If after 3:30p, please call main pharmacy at: [phone] 09/09/2017 2:36 PM

## 2017-09-10 ENCOUNTER — Inpatient Hospital Stay (HOSPITAL_COMMUNITY): Payer: Medicare Other

## 2017-09-10 DIAGNOSIS — E11621 Type 2 diabetes mellitus with foot ulcer: Secondary | ICD-10-CM

## 2017-09-10 LAB — COMPREHENSIVE METABOLIC PANEL
ALT: 30 U/L (ref 17–63)
ANION GAP: 13 (ref 5–15)
AST: 80 U/L — ABNORMAL HIGH (ref 15–41)
Albumin: 2 g/dL — ABNORMAL LOW (ref 3.5–5.0)
Alkaline Phosphatase: 173 U/L — ABNORMAL HIGH (ref 38–126)
BILIRUBIN TOTAL: 2.6 mg/dL — AB (ref 0.3–1.2)
BUN: 28 mg/dL — ABNORMAL HIGH (ref 6–20)
CHLORIDE: 91 mmol/L — AB (ref 101–111)
CO2: 24 mmol/L (ref 22–32)
Calcium: 8.3 mg/dL — ABNORMAL LOW (ref 8.9–10.3)
Creatinine, Ser: 2.57 mg/dL — ABNORMAL HIGH (ref 0.61–1.24)
GFR calc Af Amer: 29 mL/min — ABNORMAL LOW (ref >60)
GFR, EST NON AFRICAN AMERICAN: 25 mL/min — AB (ref >60)
GLUCOSE: 160 mg/dL — AB (ref 65–99)
POTASSIUM: 3.8 mmol/L (ref 3.5–5.1)
Sodium: 128 mmol/L — ABNORMAL LOW (ref 135–145)
TOTAL PROTEIN: 6.8 g/dL (ref 6.5–8.1)

## 2017-09-10 LAB — GLUCOSE, CAPILLARY
GLUCOSE-CAPILLARY: 137 mg/dL — AB (ref 65–99)
Glucose-Capillary: 148 mg/dL — ABNORMAL HIGH (ref 65–99)
Glucose-Capillary: 190 mg/dL — ABNORMAL HIGH (ref 65–99)
Glucose-Capillary: 206 mg/dL — ABNORMAL HIGH (ref 65–99)

## 2017-09-10 LAB — CBC
HEMATOCRIT: 34.1 % — AB (ref 39.0–52.0)
Hemoglobin: 10.9 g/dL — ABNORMAL LOW (ref 13.0–17.0)
MCH: 32.1 pg (ref 26.0–34.0)
MCHC: 32 g/dL (ref 30.0–36.0)
MCV: 100.3 fL — AB (ref 78.0–100.0)
PLATELETS: 88 10*3/uL — AB (ref 150–400)
RBC: 3.4 MIL/uL — ABNORMAL LOW (ref 4.22–5.81)
RDW: 22 % — AB (ref 11.5–15.5)
WBC: 6.8 10*3/uL (ref 4.0–10.5)

## 2017-09-10 LAB — PROTIME-INR
INR: 2.97
Prothrombin Time: 30.7 seconds — ABNORMAL HIGH (ref 11.4–15.2)

## 2017-09-10 MED ORDER — WARFARIN SODIUM 1 MG PO TABS
1.0000 mg | ORAL_TABLET | Freq: Once | ORAL | Status: AC
  Administered 2017-09-10: 1 mg via ORAL
  Filled 2017-09-10 (×2): qty 1

## 2017-09-10 MED ORDER — ONDANSETRON HCL 4 MG/2ML IJ SOLN
4.0000 mg | Freq: Four times a day (QID) | INTRAMUSCULAR | Status: DC
  Administered 2017-09-10 – 2017-09-14 (×6): 4 mg via INTRAVENOUS
  Filled 2017-09-10 (×7): qty 2

## 2017-09-10 MED ORDER — COLCHICINE 0.6 MG PO TABS
0.3000 mg | ORAL_TABLET | ORAL | Status: DC
  Administered 2017-09-12 – 2017-09-15 (×2): 0.3 mg via ORAL
  Filled 2017-09-10: qty 1
  Filled 2017-09-10: qty 0.5
  Filled 2017-09-10: qty 1

## 2017-09-10 NOTE — Progress Notes (Signed)
ANTICOAGULATION CONSULT NOTE  Pharmacy Consult for warfarin Indication: h/o  atrial fibrillation  No Known Allergies  Patient Measurements: Height: 5\' 10"  (177.8 cm) Weight: 156 lb (70.8 kg) IBW/kg (Calculated) : 73  Vital Signs: Temp: 97.2 F (36.2 C) (10/06 0824) Temp Source: Oral (10/06 0824) BP: 105/51 (10/06 0824) Pulse Rate: 69 (10/06 0824)  Labs:  Recent Labs  09/07/17 1330 09/08/17 0225 09/09/17 0344 09/09/17 0654 09/10/17 0325  HGB 10.2* 11.5* 11.1*  --  10.9*  HCT 31.2* 36.1* 35.1*  --  34.1*  PLT 65* 66* 78*  --  88*  LABPROT  --  26.4* 26.7*  --  30.7*  INR  --  2.45 2.49  --  2.97  CREATININE 3.27*  --   --  3.60* 2.57*    Estimated Creatinine Clearance: 29.8 mL/min (A) (by C-G formula based on SCr of 2.57 mg/dL (H)).   Assessment: 65 YOM with AFib on warfarin. PTA the patient had INR checked on 9/28 which resulted at 1.8 and dose was changed to 1mg  daily except 2mg  on Fridays (previously 1mg  every day). PTA dose last taken on 9/28.  He was admitted to [facility] on 09/03/17 for pneumonia, HCAP.  Completed Cefepime today 09/10/17.  The INR on admission 9/29 was 2.37 therapeutic.  INR today remains therapeutic (INR 2.97<<2.49 << 2.45, goal of 2-3). Hgb/Hct low but stable. Noted chronic thrombocytopenia with plts trend down to 66 on 10/4 but back up to 78 on 10/5 and up to 88 today 10/6. Will watch and consider discussing warfarin interruption if plts fall <50 (previously discussed with MD - Nettey)  Goal of Therapy:  INR 2-3 Monitor platelets by anticoagulation protocol: Yes   Plan:  1. Warfarin 1 mg x 1 dose at 1800 today 2. Will continue to monitor for any signs/symptoms of bleeding and will follow up with PT/INR in the a.m.  Thank you for allowing pharmacy to be a part of this patient's care.  Nicole Cella, RPh Clinical Pharmacist Pager: [phone] Clinical phone for 09/10/2017 from 8a-3:30p: [phone] If after 3:30p, please call main pharmacy at:  [phone] 09/10/2017 12:10 PM

## 2017-09-10 NOTE — Progress Notes (Signed)
Physical Therapy Wound Treatment Patient Details  Name: [name] MRN: [id number] Date of Birth: [birth year]  Today's Date: 09/10/2017 Time: 8676-7209 Time Calculation (min): 36 min  Subjective  Subjective: I don't think I can walk today Patient and Family Stated Goals: healed up so I can sit without pain.  Pain Score: Pain Score: 5     Wound Assessment  Pressure Injury 08/02/17 Unstageable - Full thickness tissue loss in which the base of the ulcer is covered by slough (yellow, tan, gray, green or brown) and/or eschar (tan, brown or black) in the wound bed. present on admission to rehab (Active)  Wound Image   09/10/2017  2:02 PM  Dressing Type ABD;Gauze (Comment);Moist to dry 09/10/2017  2:02 PM  Dressing Changed 09/10/2017  2:02 PM  Dressing Change Frequency Daily 09/10/2017  2:02 PM  State of Healing Eschar 09/10/2017  2:02 PM  Site / Wound Assessment Granulation tissue;Yellow;Other (Comment) 09/10/2017  2:02 PM  % Wound base Red or Granulating 50% 09/10/2017  2:02 PM  % Wound base Yellow/Fibrinous Exudate 50% 09/10/2017  2:02 PM  % Wound base Black/Eschar 0% 09/10/2017  2:02 PM  % Wound base Other/Granulation Tissue (Comment) 0% 09/10/2017  2:02 PM  Peri-wound Assessment Erythema (blanchable);Intact 09/10/2017  2:02 PM  Wound Length (cm) 7 cm 09/06/2017 11:24 AM  Wound Width (cm) 5 cm 09/06/2017 11:24 AM  Wound Depth (cm) 2.5 cm 09/06/2017 11:24 AM  Wound Surface Area (cm^2) 35 cm^2 09/06/2017 11:24 AM  Wound Volume (cm^3) 87.5 cm^3 09/06/2017 11:24 AM  Margins Unattached edges (unapproximated) 09/10/2017  2:02 PM  Drainage Amount Moderate 09/10/2017  2:02 PM  Drainage Description Serous 09/10/2017  2:02 PM  Treatment Hydrotherapy (Pulse lavage);Packing (Saline gauze) 09/10/2017  2:02 PM   Hydrotherapy Pulsed lavage therapy - wound location: Sacral Pulsed Lavage with Suction (psi): 8 psi Pulsed Lavage with Suction - Normal Saline Used: 1000 mL Pulsed Lavage Tip: Tip with splash shield     Wound Assessment and Plan  Wound Therapy - Assess/Plan/Recommendations Wound Therapy - Clinical Statement: Wound fairly clean, wound base with fascia present, some yellow eschar present. Wound Therapy - Functional Problem List: Decrease mobility (see PT notes),  Factors Delaying/Impairing Wound Healing: Diabetes Mellitus;Immobility;Multiple medical problems;Other (comment) Hydrotherapy Plan: Debridement;Dressing change;Patient/family education;Pulsatile lavage with suction Wound Therapy - Frequency: 6X / week Wound Therapy - Current Recommendations: Case manager/social work;PT Wound Therapy - Follow Up Recommendations: Home health RN Wound Plan: see above  Wound Therapy Goals- Improve the function of patient's integumentary system by progressing the wound(s) through the phases of wound healing (inflammation - proliferation - remodeling) by: Decrease Necrotic Tissue to: 10% Decrease Necrotic Tissue - Progress: Progressing toward goal Increase Granulation Tissue to: 90% Increase Granulation Tissue - Progress: Progressing toward goal  Goals will be updated until maximal potential achieved or discharge criteria met.  Discharge criteria: when goals achieved, discharge from hospital, MD decision/surgical intervention, no progress towards goals, refusal/missing three consecutive treatments without notification or medical reason.  GP     Shanna Cisco 09/10/2017, 2:51 PM

## 2017-09-10 NOTE — Progress Notes (Signed)
VASCULAR LAB PRELIMINARY  ARTERIAL  ABI completed: Previous ABI's obtained on 04/28/17 demonstrated values of 1.55 on the right and 1.51 on the left. Unable to accurately obtain right ABI due to non-compressible arteries likely due to medial calcification. Left ABI of 1.66 is suggestive of elevated velocities likely due to medial calcification. Right TBI of 0.28 and left TBI of 0.52 are suggestive of abnormal arterial flow at rest.   RIGHT    LEFT    PRESSURE WAVEFORM  PRESSURE WAVEFORM  BRACHIAL HD Access  BRACHIAL 93 Triphasic  DP >254 Biphasic DP 147 Triphasic  PT >254 Triphasic PT 154 Triphasic  GREAT TOE 26 NA GREAT TOE 48 NA    RIGHT LEFT  ABI  1.66     Legrand Como, RVT 09/10/2017, 9:41 AM

## 2017-09-10 NOTE — Progress Notes (Signed)
PROGRESS NOTE    [name]  ZOX:[id number] DOB: 07/18/1956 DOA: 09/03/2017 PCP: Redmond School, MD   Brief Narrative: [name] is a 61 y.o. male,w Dm2, SAH, Pafib, cirrhosis, Mild MR, Mild AR, CAD, CHF (EF 20%) ESRD on HD (M,W, F), apparently c/o cough with yellow sputum and mild hemoptysis. He was found to have health care associated pneumonia. Admitted for the management of hcap.    Assessment & Plan:   Principal Problem:   HCAP (healthcare-associated pneumonia) Active Problems:   DM type 2, uncontrolled, with renal complications (HCC)   Abnormal LFTs   Hyponatremia   Hemoptysis   Status post thoracentesis   Colostomy in place [facility])   ESRD on dialysis ([place])   Malnutrition of moderate degree   HCAP Patient treated with vancomycin and cefepime. Blood cultures without growth. No sputum culture sent on admission. Concern for worsening infiltrate, however, patient has been afebrile and with a normal WBC. Completed antibiotic course. -sputum gram stain/culture (no sample sent) -discontinue cefepime  Pleural effusion Appears to likely be transudate. Possibly from heart failure. Asymptomatic. Stable.  Sacral decubitus ulcer Unstageable. Afebrile. -hydrotherapy -wound care recommendations: plan for wound vac early next week  Diabetes mellitus -continue SSI  Hypotension Chronic. -continue midodrine  Hemoptysis Resolved. Secondary to HCAP and frequent coughing.  ESRD on hemodialysis -nephrology recommendations  Thrombocytopenia Stable. Chronic.  Leukocytosis Resolved.  Hyponatremia Stable  Atrial fibrillation -continue Coumadin  Cirrhosis Hyperbilirubinemia Bilirubin slightly decreased. Likely made worse by hypotension. Outpatient follow-up  History of rectal carcinoma S/p diverting colostomy. Stable.  Right great toe ulcer Col foot. Pulses palpable. Previous ABI significant for elevated ABIs secondary to early calcification -ABI  pending   DVT prophylaxis: Coumadin Code Status: Full code Family Communication: None Disposition Plan: Discharge to [place]   Consultants:   Nephrology  Procedures:   Hemodialysis  Antimicrobials:  Vancomycin (9/28>>10/3)  Cefepime (9/29>>   Subjective: Cough improving. Decreased sputum.   Objective: Vitals:   09/09/17 1807 09/09/17 2104 09/10/17 0428 09/10/17 0824  BP: (!) 122/49 (!) 98/54 (!) 97/50 (!) 105/51  Pulse: 68 77 70 69  Resp: 18 18 18 18   Temp: 97.9 F (36.6 C) 97.8 F (36.6 C) 97.7 F (36.5 C) (!) 97.2 F (36.2 C)  TempSrc: Oral Oral Oral Oral  SpO2: 97% 95% 97% 95%  Weight:  70.8 kg (156 lb)    Height:        Intake/Output Summary (Last 24 hours) at 09/10/17 1141 Last data filed at 09/10/17 0618  Gross per 24 hour  Intake              720 ml  Output              125 ml  Net              595 ml   Filed Weights   09/07/17 1429 09/07/17 1811 09/09/17 2104  Weight: 71.7 kg (158 lb) 71.2 kg (157 lb) 70.8 kg (156 lb)    Examination:  General exam: Appears calm and comfortable Respiratory system: Clear to auscultation. Respiratory effort normal. Cardiovascular system: S1 & S2 heard, RRR. No murmurs Gastrointestinal system: Soft, non-tender, slightly distended, no guarding, no rebound, no masses felt. Colostomy bag. Central nervous system: Somnolent, arouses to voice. Extremities: No calf tenderness Skin: No cyanosis. No rashes. Jaundice.    Data Reviewed: I have personally reviewed following labs and imaging studies  CBC:  Recent Labs Lab 09/07/17 0256 09/07/17 1330 09/08/17 0225  09/09/17 0344 09/10/17 0325  WBC 7.3 6.2 6.8 6.7 6.8  HGB 11.5* 10.2* 11.5* 11.1* 10.9*  HCT 35.4* 31.2* 36.1* 35.1* 34.1*  MCV 99.4 98.4 100.3* 99.4 100.3*  PLT 90* 65* 66* 78* 88*   Basic Metabolic Panel:  Recent Labs Lab 09/04/17 0256 09/05/17 0800 09/07/17 1330 09/09/17 0654 09/10/17 0325  NA 133* 130* 127* 125* 128*  K 4.2 3.6 3.3* 3.9  3.8  CL 96* 93* 92* 90* 91*  CO2 21* 24 25 24 24   GLUCOSE 109* 112* 167* 115* 160*  BUN 26* 22* 35* 39* 28*  CREATININE 3.53* 2.62* 3.27* 3.60* 2.57*  CALCIUM 8.8* 8.1* 8.2* 8.5* 8.3*  PHOS  --  2.5 2.6 3.1  --    GFR: Estimated Creatinine Clearance: 29.8 mL/min (A) (by C-G formula based on SCr of 2.57 mg/dL (H)). Liver Function Tests:  Recent Labs Lab 09/04/17 0256 09/05/17 0800 09/07/17 1330 09/09/17 0654 09/10/17 0325  AST 45*  --   --   --  80*  ALT 14*  --   --   --  30  ALKPHOS 98  --   --   --  173*  BILITOT 3.3*  --   --   --  2.6*  PROT 6.7  --   --   --  6.8  ALBUMIN 2.3* 2.3* 1.9* 2.1* 2.0*   No results for input(s): LIPASE, AMYLASE in the last 168 hours. No results for input(s): AMMONIA in the last 168 hours. Coagulation Profile:  Recent Labs Lab 09/06/17 0353 09/07/17 0256 09/08/17 0225 09/09/17 0344 09/10/17 0325  INR 2.28 2.44 2.45 2.49 2.97   Cardiac Enzymes:  Recent Labs Lab 09/03/17 1240 09/03/17 1829  TROPONINI 0.05* 0.06*   BNP (last 3 results) No results for input(s): PROBNP in the last 8760 hours. HbA1C: No results for input(s): HGBA1C in the last 72 hours. CBG:  Recent Labs Lab 09/08/17 2101 09/09/17 1230 09/09/17 1626 09/09/17 2104 09/10/17 0820  GLUCAP 146* 95 208* 165* 148*   Lipid Profile: No results for input(s): CHOL, HDL, LDLCALC, TRIG, CHOLHDL, LDLDIRECT in the last 72 hours. Thyroid Function Tests: No results for input(s): TSH, T4TOTAL, FREET4, T3FREE, THYROIDAB in the last 72 hours. Anemia Panel: No results for input(s): VITAMINB12, FOLATE, FERRITIN, TIBC, IRON, RETICCTPCT in the last 72 hours. Sepsis Labs: No results for input(s): PROCALCITON, LATICACIDVEN in the last 168 hours.  Recent Results (from the past 240 hour(s))  Culture, blood (routine x 2) Call MD if unable to obtain prior to antibiotics being given     Status: None   Collection Time: 09/03/17  8:56 AM  Result Value Ref Range Status   Specimen  Description BLOOD BLOOD LEFT HAND  Final   Special Requests IN PEDIATRIC BOTTLE Blood Culture adequate volume  Final   Culture NO GROWTH 5 DAYS  Final   Report Status 09/08/2017 FINAL  Final  Culture, blood (routine x 2) Call MD if unable to obtain prior to antibiotics being given     Status: None   Collection Time: 09/03/17  9:00 AM  Result Value Ref Range Status   Specimen Description BLOOD BLOOD LEFT HAND  Final   Special Requests IN PEDIATRIC BOTTLE Blood Culture adequate volume  Final   Culture NO GROWTH 5 DAYS  Final   Report Status 09/08/2017 FINAL  Final  Respiratory Panel by PCR     Status: None   Collection Time: 09/03/17  5:32 PM  Result Value Ref Range Status  Adenovirus NOT DETECTED NOT DETECTED Final   Coronavirus 229E NOT DETECTED NOT DETECTED Final   Coronavirus HKU1 NOT DETECTED NOT DETECTED Final   Coronavirus NL63 NOT DETECTED NOT DETECTED Final   Coronavirus OC43 NOT DETECTED NOT DETECTED Final   Metapneumovirus NOT DETECTED NOT DETECTED Final   Rhinovirus / Enterovirus NOT DETECTED NOT DETECTED Final   Influenza A NOT DETECTED NOT DETECTED Final   Influenza B NOT DETECTED NOT DETECTED Final   Parainfluenza Virus 1 NOT DETECTED NOT DETECTED Final   Parainfluenza Virus 2 NOT DETECTED NOT DETECTED Final   Parainfluenza Virus 3 NOT DETECTED NOT DETECTED Final   Parainfluenza Virus 4 NOT DETECTED NOT DETECTED Final   Respiratory Syncytial Virus NOT DETECTED NOT DETECTED Final   Bordetella pertussis NOT DETECTED NOT DETECTED Final   Chlamydophila pneumoniae NOT DETECTED NOT DETECTED Final   Mycoplasma pneumoniae NOT DETECTED NOT DETECTED Final  MRSA PCR Screening     Status: None   Collection Time: 09/03/17  5:32 PM  Result Value Ref Range Status   MRSA by PCR NEGATIVE NEGATIVE Final    Comment:        The GeneXpert MRSA Assay (FDA approved for NASAL specimens only), is one component of a comprehensive MRSA colonization surveillance program. It is not intended  to diagnose MRSA infection nor to guide or monitor treatment for MRSA infections.   Culture, body fluid-bottle     Status: None   Collection Time: 09/04/17  2:00 PM  Result Value Ref Range Status   Specimen Description PLEURAL  Final   Special Requests NONE  Final   Culture NO GROWTH 5 DAYS  Final   Report Status 09/09/2017 FINAL  Final  Gram stain     Status: None   Collection Time: 09/04/17  2:00 PM  Result Value Ref Range Status   Specimen Description PLEURAL  Final   Special Requests NONE  Final   Gram Stain   Final    WBC PRESENT, PREDOMINANTLY PMN NO ORGANISMS SEEN CYTOSPIN SMEAR    Report Status 09/04/2017 FINAL  Final         Radiology Studies: No results found.      Scheduled Meds: . colchicine  0.3 mg Oral Daily  . collagenase   Topical Daily  . darbepoetin (ARANESP) injection - DIALYSIS  60 mcg Intravenous Q Wed-HD  . feeding supplement (NEPRO CARB STEADY)  237 mL Oral BID BM  . feeding supplement (PRO-STAT SUGAR FREE 64)  30 mL Oral BID WC  . guaiFENesin  1,200 mg Oral BID  . insulin aspart  0-5 Units Subcutaneous QHS  . insulin aspart  0-9 Units Subcutaneous TID WC  . loratadine  10 mg Oral Daily  . midodrine  10 mg Oral TID WC  . multivitamin  1 tablet Oral QHS  . pantoprazole  40 mg Oral Daily  . sodium chloride flush  3 mL Intravenous Q12H  . Warfarin - Pharmacist Dosing Inpatient   Does not apply q1800   Continuous Infusions: . sodium chloride    . ceFEPime (MAXIPIME) IV Stopped (09/09/17 2224)     LOS: 7 days     Cordelia Poche, MD [facility] 09/10/2017, 11:41 AM Pager: [phone]  If 7PM-7AM, please contact night-coverage [email or web address] Password [email or web address] 09/10/2017, 11:41 AM

## 2017-09-10 NOTE — Progress Notes (Signed)
[facility] Progress Note   Subjective: Feeling better, more interactive today. No new C/Os.   Objective Vitals:   09/09/17 1807 09/09/17 2104 09/10/17 0428 09/10/17 0824  BP: (!) 122/49 (!) 98/54 (!) 97/50 (!) 105/51  Pulse: 68 77 70 69  Resp: 18 18 18 18   Temp: 97.9 F (36.6 C) 97.8 F (36.6 C) 97.7 F (36.5 C) (!) 97.2 F (36.2 C)  TempSrc: Oral Oral Oral Oral  SpO2: 97% 95% 97% 95%  Weight:  70.8 kg (156 lb)    Height:       Physical Exam General: Chronically ill appearing male in NAD Heart: S1,S2, regularly irregular. 1/6 systolic M.  Lungs: CTAB Abdomen: Active BS Ostomy LLQ. Edema present RLQ of abd Extremities: Still has edema in hips/thigh-No LE edema. Pressure relieving boots.  Dialysis Access: RUA AVF + bruit   Additional Objective Labs: Basic Metabolic Panel:  Recent Labs Lab 09/05/17 0800 09/07/17 1330 09/09/17 0654 09/10/17 0325  NA 130* 127* 125* 128*  K 3.6 3.3* 3.9 3.8  CL 93* 92* 90* 91*  CO2 24 25 24 24   GLUCOSE 112* 167* 115* 160*  BUN 22* 35* 39* 28*  CREATININE 2.62* 3.27* 3.60* 2.57*  CALCIUM 8.1* 8.2* 8.5* 8.3*  PHOS 2.5 2.6 3.1  --    Liver Function Tests:  Recent Labs Lab 09/04/17 0256  09/07/17 1330 09/09/17 0654 09/10/17 0325  AST 45*  --   --   --  80*  ALT 14*  --   --   --  30  ALKPHOS 98  --   --   --  173*  BILITOT 3.3*  --   --   --  2.6*  PROT 6.7  --   --   --  6.8  ALBUMIN 2.3*  < > 1.9* 2.1* 2.0*  < > = values in this interval not displayed. No results for input(s): LIPASE, AMYLASE in the last 168 hours. CBC:  Recent Labs Lab 09/07/17 0256 09/07/17 1330 09/08/17 0225 09/09/17 0344 09/10/17 0325  WBC 7.3 6.2 6.8 6.7 6.8  HGB 11.5* 10.2* 11.5* 11.1* 10.9*  HCT 35.4* 31.2* 36.1* 35.1* 34.1*  MCV 99.4 98.4 100.3* 99.4 100.3*  PLT 90* 65* 66* 78* 88*   Blood Culture    Component Value Date/Time   SDES PLEURAL 09/04/2017 1400   SDES PLEURAL 09/04/2017 1400   SPECREQUEST NONE  09/04/2017 1400   SPECREQUEST NONE 09/04/2017 1400   CULT NO GROWTH 5 DAYS 09/04/2017 1400   REPTSTATUS 09/09/2017 FINAL 09/04/2017 1400   REPTSTATUS 09/04/2017 FINAL 09/04/2017 1400    Cardiac Enzymes:  Recent Labs Lab 09/03/17 1240 09/03/17 1829  TROPONINI 0.05* 0.06*   CBG:  Recent Labs Lab 09/08/17 2101 09/09/17 1230 09/09/17 1626 09/09/17 2104 09/10/17 0820  GLUCAP 146* 95 208* 165* 148*   Iron Studies: No results for input(s): IRON, TIBC, TRANSFERRIN, FERRITIN in the last 72 hours. @lablastinr3 @ Studies/Results: No results found. Medications: . sodium chloride    . ceFEPime (MAXIPIME) IV Stopped (09/09/17 2224)   . colchicine  0.3 mg Oral Daily  . collagenase   Topical Daily  . darbepoetin (ARANESP) injection - DIALYSIS  60 mcg Intravenous Q Wed-HD  . feeding supplement (NEPRO CARB STEADY)  237 mL Oral BID BM  . feeding supplement (PRO-STAT SUGAR FREE 64)  30 mL Oral BID WC  . guaiFENesin  1,200 mg Oral BID  . insulin aspart  0-5 Units Subcutaneous QHS  . insulin aspart  0-9  Units Subcutaneous TID WC  . loratadine  10 mg Oral Daily  . midodrine  10 mg Oral TID WC  . multivitamin  1 tablet Oral QHS  . pantoprazole  40 mg Oral Daily  . sodium chloride flush  3 mL Intravenous Q12H  . sucroferric oxyhydroxide  500 mg Oral TID WC  . Warfarin - Pharmacist Dosing Inpatient   Does not apply q1800     Dialysis Orders: [place]/ [facility] MWF 4h 3K/2.5 77kg Hep none R AVF - Mircera 50 ug q 2, to start 10/3 - Venofer 50/wk  Assessment/Plan: 1. HCAP: Per CT. Was on Vanc/Cefepime -> now Cefepime alone. S/p 1.8L thora 9/30: Sm-mod R pleural effusion on CXR 09/08/17 . 2. ESRD: Continue HD MWF sched. Next HD 09/12/17. Will possibly be DC'd to [facility] or [facility].  3.Hypotension/volume: HD yesterday. Net UF 1.0 Post wt 70.8 kg. Will need lower EDW on DC. Still has pitting edema abdomen and thigh. Continue attempting to lower volume as BP permits.  Chronic hypotension on midodrine.  4. Anemia: Hgb 10.9 S/p Aranesp 46mcg 10/3. 5. Secondary hyperparathyroidism:Corr Ca ok, Phos slightly low. DC binders, liberate diet.  6. Nutrition: Albumin 2.0  Heart healthy diet. Start prostat.  7. CHF/CM: EF 15-20%, Hx AICD 8. Afib: On warfarin 9. DM: On insulin. 10. Dispo: Requesting transfer of dialysis centers (to [facility]). This was approved and will be starting at [facility] on discharge. Chair time 11:45a on MWF, needs to be there at 11:00am on first day for paperwork. 11. Sacral decub - stage IV, f/b WC team. Have d/w primary MD, not ready for dc given these problems. 12. Cirrhosis 13.  Hx rectal Ca - sp diverting colostomy  14.  Dispo - will be dc'd to LT[facility] eventually per primary   Rita H. Brown NP-C 09/10/2017, 11:00 AM  [facility] [phone]  Pt seen, examined, agree w assess/plan as above with additions as indicated.  Kelly Splinter MD [employer] pager [phone]    cell [phone] 09/10/2017, 1:13 PM

## 2017-09-11 LAB — CBC
HCT: 34.9 % — ABNORMAL LOW (ref 39.0–52.0)
Hemoglobin: 11.3 g/dL — ABNORMAL LOW (ref 13.0–17.0)
MCH: 32.1 pg (ref 26.0–34.0)
MCHC: 32.4 g/dL (ref 30.0–36.0)
MCV: 99.1 fL (ref 78.0–100.0)
PLATELETS: 94 10*3/uL — AB (ref 150–400)
RBC: 3.52 MIL/uL — AB (ref 4.22–5.81)
RDW: 22 % — AB (ref 11.5–15.5)
WBC: 8.5 10*3/uL (ref 4.0–10.5)

## 2017-09-11 LAB — GLUCOSE, CAPILLARY
GLUCOSE-CAPILLARY: 111 mg/dL — AB (ref 65–99)
GLUCOSE-CAPILLARY: 163 mg/dL — AB (ref 65–99)
GLUCOSE-CAPILLARY: 154 mg/dL — AB (ref 65–99)
Glucose-Capillary: 96 mg/dL (ref 65–99)
Glucose-Capillary: 151 mg/dL — ABNORMAL HIGH (ref 65–99)

## 2017-09-11 LAB — PROTIME-INR
INR: 3.54
Prothrombin Time: 35.2 seconds — ABNORMAL HIGH (ref 11.4–15.2)

## 2017-09-11 MED ORDER — RESOURCE THICKENUP CLEAR PO POWD
Freq: Once | ORAL | Status: AC
  Administered 2017-09-11: 17:00:00 via ORAL
  Filled 2017-09-11: qty 125

## 2017-09-11 MED ORDER — GUAIFENESIN 100 MG/5ML PO SOLN
5.0000 mL | ORAL | Status: DC | PRN
  Administered 2017-09-11 – 2017-09-14 (×3): 100 mg via ORAL
  Filled 2017-09-11 (×3): qty 5

## 2017-09-11 NOTE — Progress Notes (Signed)
ANTICOAGULATION CONSULT NOTE  Pharmacy Consult for warfarin Indication: h/o  atrial fibrillation  No Known Allergies  Patient Measurements: Height: 5\' 10"  (177.8 cm) Weight: 157 lb (71.2 kg) IBW/kg (Calculated) : 73  Vital Signs: Temp: 97.5 F (36.4 C) (10/07 0800) Temp Source: Oral (10/07 0800) BP: 103/57 (10/07 0800) Pulse Rate: 71 (10/07 0800)  Labs:  Recent Labs  09/09/17 0344 09/09/17 0654 09/10/17 0325 09/11/17 0242  HGB 11.1*  --  10.9* 11.3*  HCT 35.1*  --  34.1* 34.9*  PLT 78*  --  88* 94*  LABPROT 26.7*  --  30.7* 35.2*  INR 2.49  --  2.97 3.54  CREATININE  --  3.60* 2.57*  --     Estimated Creatinine Clearance: 30 mL/min (A) (by C-G formula based on SCr of 2.57 mg/dL (H)).   Assessment: 62 YOM with AFib on warfarin. PTA the patient had INR checked on 9/28 which resulted at 1.8 and dose was changed to 1mg  daily except 2mg  on Fridays (previously 1mg  every day). PTA dose last taken on 9/28.  He was admitted to [facility] on 09/03/17 for pneumonia, HCAP.  Completed Cefepime today 09/10/17.  The INR on admission 9/29 was 2.37 therapeutic.  INR today = 3.54, has increased to SUPRAtherapeutic level on 1mg  daily.  Hgb/Hct low but stable w/slight improvement today. Noted to have chronic thrombocytopenia with plts down to 66 on 10/4 but back up to 94 today. As previously discussed with Dr. Lonny Prude, we will watch pltc and consider discussing warfarin interruption if plts fall <50k.  Goal of Therapy:  INR 2-3 Monitor platelets by anticoagulation protocol: Yes   Plan:  1. Hold Warfarin dose today 2. Will continue to monitor for any signs/symptoms of bleeding and will follow up with PT/INR in the a.m./daily.  Thank you for allowing pharmacy to be a part of this patient's care.  Nicole Cella, RPh Clinical Pharmacist Pager: [phone] Clinical phone for 09/11/2017 from 8a-3:30p: [phone] If after 3:30p, please call main pharmacy at: [phone] 09/11/2017 12:34 PM

## 2017-09-11 NOTE — Progress Notes (Signed)
PROGRESS NOTE    [name]  XTK:[id number] DOB: [birth year] DOA: 09/03/2017 PCP: Redmond School, MD   Brief Narrative: [name] is a 61 y.o. male,w Dm2, SAH, Pafib, cirrhosis, Mild MR, Mild AR, CAD, CHF (EF 20%) ESRD on HD (M,W, F), apparently c/o cough with yellow sputum and mild hemoptysis. He was found to have health care associated pneumonia. Admitted for the management of hcap.    Assessment & Plan:   Principal Problem:   HCAP (healthcare-associated pneumonia) Active Problems:   DM type 2, uncontrolled, with renal complications (HCC)   Abnormal LFTs   Hyponatremia   Hemoptysis   Status post thoracentesis   Colostomy in place [facility])   ESRD on dialysis ([place])   Malnutrition of moderate degree   HCAP Patient treated with vancomycin and cefepime. Blood cultures without growth. No sputum culture sent on admission. Concern for worsening infiltrate, however, patient has been afebrile and with a normal WBC. Completed antibiotic course. -sputum gram stain/culture (no sample sent)  Pleural effusion Appears to likely be transudate. Possibly from heart failure. Asymptomatic. Stable.  Sacral decubitus ulcer Unstageable. Afebrile. -hydrotherapy -wound care recommendations: plan for wound vac early next week  Diabetes mellitus -continue SSI  Hypotension Chronic. -continue midodrine  Hemoptysis Resolved. Secondary to HCAP and frequent coughing.  ESRD on hemodialysis -nephrology recommendations  Thrombocytopenia Stable. Chronic.  Leukocytosis Resolved.  Hyponatremia Stable  Atrial fibrillation -continue Coumadin  Cirrhosis Hyperbilirubinemia Bilirubin slightly decreased. Likely made worse by hypotension. Outpatient follow-up  History of rectal carcinoma S/p diverting colostomy. Stable.  Right great toe ulcer Cold foot. Pulses palpable. Previous ABI significant for elevated ABIs secondary to early calcification. Recurrent ABI similar.   DVT  prophylaxis: Coumadin Code Status: Full code Family Communication: None Disposition Plan: Discharge to LTAC if able, otherwise, SNF   Consultants:   Nephrology  Procedures:   Hemodialysis  Antimicrobials:  Vancomycin (9/28>>10/3)  Cefepime (9/29>>   Subjective: No concerns today  Objective: Vitals:   09/10/17 1722 09/10/17 2200 09/11/17 0534 09/11/17 0800  BP: (!) 123/52 107/60 112/62 (!) 103/57  Pulse: 74 73 73 71  Resp: 20 19 19 16   Temp: (!) 97.5 F (36.4 C) 98.5 F (36.9 C) 97.9 F (36.6 C) (!) 97.5 F (36.4 C)  TempSrc: Oral Oral Oral Oral  SpO2: 97% 98% 99% 95%  Weight:  71.2 kg (157 lb)    Height:        Intake/Output Summary (Last 24 hours) at 09/11/17 1152 Last data filed at 09/11/17 1000  Gross per 24 hour  Intake             1314 ml  Output              200 ml  Net             1114 ml   Filed Weights   09/09/17 2104 09/10/17 2200  Weight: 70.8 kg (156 lb) 71.2 kg (157 lb)    Examination:  General exam: Appears calm and comfortable Respiratory system: Clear to auscultation. Respiratory effort normal. Cardiovascular system: S1 & S2 heard, RRR. No murmurs Gastrointestinal system: Soft, non-tender, slightly distended, no guarding, no rebound, no masses felt. Colostomy bag. Central nervous system: Alert, oriented Extremities: No calf tenderness Skin: No cyanosis. No rashes. Jaundice.    Data Reviewed: I have personally reviewed following labs and imaging studies  CBC:  Recent Labs Lab 09/07/17 1330 09/08/17 0225 09/09/17 0344 09/10/17 0325 09/11/17 0242  WBC 6.2 6.8 6.7 6.8  8.5  HGB 10.2* 11.5* 11.1* 10.9* 11.3*  HCT 31.2* 36.1* 35.1* 34.1* 34.9*  MCV 98.4 100.3* 99.4 100.3* 99.1  PLT 65* 66* 78* 88* 94*   Basic Metabolic Panel:  Recent Labs Lab 09/05/17 0800 09/07/17 1330 09/09/17 0654 09/10/17 0325  NA 130* 127* 125* 128*  K 3.6 3.3* 3.9 3.8  CL 93* 92* 90* 91*  CO2 24 25 24 24   GLUCOSE 112* 167* 115* 160*  BUN 22*  35* 39* 28*  CREATININE 2.62* 3.27* 3.60* 2.57*  CALCIUM 8.1* 8.2* 8.5* 8.3*  PHOS 2.5 2.6 3.1  --    GFR: Estimated Creatinine Clearance: 30 mL/min (A) (by C-G formula based on SCr of 2.57 mg/dL (H)). Liver Function Tests:  Recent Labs Lab 09/05/17 0800 09/07/17 1330 09/09/17 0654 09/10/17 0325  AST  --   --   --  80*  ALT  --   --   --  30  ALKPHOS  --   --   --  173*  BILITOT  --   --   --  2.6*  PROT  --   --   --  6.8  ALBUMIN 2.3* 1.9* 2.1* 2.0*   No results for input(s): LIPASE, AMYLASE in the last 168 hours. No results for input(s): AMMONIA in the last 168 hours. Coagulation Profile:  Recent Labs Lab 09/07/17 0256 09/08/17 0225 09/09/17 0344 09/10/17 0325 09/11/17 0242  INR 2.44 2.45 2.49 2.97 3.54   Cardiac Enzymes: No results for input(s): CKTOTAL, CKMB, CKMBINDEX, TROPONINI in the last 168 hours. BNP (last 3 results) No results for input(s): PROBNP in the last 8760 hours. HbA1C: No results for input(s): HGBA1C in the last 72 hours. CBG:  Recent Labs Lab 09/10/17 1238 09/10/17 1818 09/10/17 2158 09/11/17 0811 09/11/17 1148  GLUCAP 190* 206* 137* 96 111*   Lipid Profile: No results for input(s): CHOL, HDL, LDLCALC, TRIG, CHOLHDL, LDLDIRECT in the last 72 hours. Thyroid Function Tests: No results for input(s): TSH, T4TOTAL, FREET4, T3FREE, THYROIDAB in the last 72 hours. Anemia Panel: No results for input(s): VITAMINB12, FOLATE, FERRITIN, TIBC, IRON, RETICCTPCT in the last 72 hours. Sepsis Labs: No results for input(s): PROCALCITON, LATICACIDVEN in the last 168 hours.  Recent Results (from the past 240 hour(s))  Culture, blood (routine x 2) Call MD if unable to obtain prior to antibiotics being given     Status: None   Collection Time: 09/03/17  8:56 AM  Result Value Ref Range Status   Specimen Description BLOOD BLOOD LEFT HAND  Final   Special Requests IN PEDIATRIC BOTTLE Blood Culture adequate volume  Final   Culture NO GROWTH 5 DAYS  Final    Report Status 09/08/2017 FINAL  Final  Culture, blood (routine x 2) Call MD if unable to obtain prior to antibiotics being given     Status: None   Collection Time: 09/03/17  9:00 AM  Result Value Ref Range Status   Specimen Description BLOOD BLOOD LEFT HAND  Final   Special Requests IN PEDIATRIC BOTTLE Blood Culture adequate volume  Final   Culture NO GROWTH 5 DAYS  Final   Report Status 09/08/2017 FINAL  Final  Respiratory Panel by PCR     Status: None   Collection Time: 09/03/17  5:32 PM  Result Value Ref Range Status   Adenovirus NOT DETECTED NOT DETECTED Final   Coronavirus 229E NOT DETECTED NOT DETECTED Final   Coronavirus HKU1 NOT DETECTED NOT DETECTED Final   Coronavirus NL63 NOT DETECTED NOT  DETECTED Final   Coronavirus OC43 NOT DETECTED NOT DETECTED Final   Metapneumovirus NOT DETECTED NOT DETECTED Final   Rhinovirus / Enterovirus NOT DETECTED NOT DETECTED Final   Influenza A NOT DETECTED NOT DETECTED Final   Influenza B NOT DETECTED NOT DETECTED Final   Parainfluenza Virus 1 NOT DETECTED NOT DETECTED Final   Parainfluenza Virus 2 NOT DETECTED NOT DETECTED Final   Parainfluenza Virus 3 NOT DETECTED NOT DETECTED Final   Parainfluenza Virus 4 NOT DETECTED NOT DETECTED Final   Respiratory Syncytial Virus NOT DETECTED NOT DETECTED Final   Bordetella pertussis NOT DETECTED NOT DETECTED Final   Chlamydophila pneumoniae NOT DETECTED NOT DETECTED Final   Mycoplasma pneumoniae NOT DETECTED NOT DETECTED Final  MRSA PCR Screening     Status: None   Collection Time: 09/03/17  5:32 PM  Result Value Ref Range Status   MRSA by PCR NEGATIVE NEGATIVE Final    Comment:        The GeneXpert MRSA Assay (FDA approved for NASAL specimens only), is one component of a comprehensive MRSA colonization surveillance program. It is not intended to diagnose MRSA infection nor to guide or monitor treatment for MRSA infections.   Culture, body fluid-bottle     Status: None   Collection Time:  09/04/17  2:00 PM  Result Value Ref Range Status   Specimen Description PLEURAL  Final   Special Requests NONE  Final   Culture NO GROWTH 5 DAYS  Final   Report Status 09/09/2017 FINAL  Final  Gram stain     Status: None   Collection Time: 09/04/17  2:00 PM  Result Value Ref Range Status   Specimen Description PLEURAL  Final   Special Requests NONE  Final   Gram Stain   Final    WBC PRESENT, PREDOMINANTLY PMN NO ORGANISMS SEEN CYTOSPIN SMEAR    Report Status 09/04/2017 FINAL  Final         Radiology Studies: No results found.      Scheduled Meds: . [START ON 09/12/2017] colchicine  0.3 mg Oral Once per day on Mon Thu  . collagenase   Topical Daily  . darbepoetin (ARANESP) injection - DIALYSIS  60 mcg Intravenous Q Wed-HD  . feeding supplement (NEPRO CARB STEADY)  237 mL Oral BID BM  . feeding supplement (PRO-STAT SUGAR FREE 64)  30 mL Oral BID WC  . guaiFENesin  1,200 mg Oral BID  . insulin aspart  0-5 Units Subcutaneous QHS  . insulin aspart  0-9 Units Subcutaneous TID WC  . loratadine  10 mg Oral Daily  . midodrine  10 mg Oral TID WC  . multivitamin  1 tablet Oral QHS  . ondansetron (ZOFRAN) IV  4 mg Intravenous Q6H  . pantoprazole  40 mg Oral Daily  . sodium chloride flush  3 mL Intravenous Q12H  . Warfarin - Pharmacist Dosing Inpatient   Does not apply q1800   Continuous Infusions: . sodium chloride       LOS: 8 days     Cordelia Poche, MD [facility] 09/11/2017, 11:52 AM Pager: [phone]  If 7PM-7AM, please contact night-coverage [email or web address] Password [email or web address] 09/11/2017, 11:52 AM

## 2017-09-11 NOTE — Progress Notes (Signed)
Physical Therapy Treatment Patient Details Name: [name] MRN: [id number] DOB: 1956-07-03 Today's Date: 09/11/2017    History of Present Illness  [name] is a 61 y.o. male with ESRD, HD initiated 07/2017 during recent [facility] admit with MSSA bacteremia/shock. PMH includes  HTN, Type 2 DM, Hx Rectal cancer (s/p colostomy 2008), Afib (on warfarin), CAD (s/p stents), ischemic CM (EF 15-20%, ICD in place), OSA on CPAP, cirrhosis, thrombocytopenia. Admitted with productive cough and hemoptysis; suspicious for pneumonia; order for PT Hydro to follow for sacral ulcer.    PT Comments    Patient is making gradual progress toward mobility goals but is very deconditioned. Pt tolerated gait distance of 135ft total however required 3 seated rest breaks and a lot of increased time due to fatigue and SOB. Pt required min/mod A for all mobility this session. Pt will continue to benefit from further skilled PT services both acute and post acute to maximize independence and safety with mobility. Pt does wish to return home at d/c so maximizing [facility] services will be needed in that case.     Follow Up Recommendations  Assistance/supervision 24 hours; Maximize [facility] services; wound care     Equipment Recommendations  None recommended by PT    Recommendations for Other Services       Precautions / Restrictions Precautions Precautions: Fall Precaution Comments: sacral ulcer Restrictions Weight Bearing Restrictions: No    Mobility  Bed Mobility Overal bed mobility: Needs Assistance Bed Mobility: Rolling;Sidelying to Sit Rolling: Min assist Sidelying to sit: Mod assist       General bed mobility comments: cues for sequencing and assist to bring toward EOB with use of chuck pad, to bring bilat LE to EOB, and to elevate trunk into sitting  Transfers Overall transfer level: Needs assistance Equipment used: Rolling walker (2 wheeled) Transfers: Sit to/from Stand Sit to Stand: Mod assist          General transfer comment: assist to power up into standing from EOB and recliner; cues for safe hand placement  Ambulation/Gait Ambulation/Gait assistance: Min assist Ambulation Distance (Feet):  (14ft total with 3 seated rest breaks ) Assistive device: Rolling walker (2 wheeled) Gait Pattern/deviations: Step-through pattern;Decreased step length - right;Decreased step length - left;Trunk flexed Gait velocity: slowed   General Gait Details: cues for posture; pt with guarded movements; SOB at times    Stairs            Wheelchair Mobility    Modified Rankin (Stroke Patients Only)       Balance Overall balance assessment: Needs assistance Sitting-balance support: Bilateral upper extremity supported;Single extremity supported;Feet supported Sitting balance-Leahy Scale: Good     Standing balance support: Bilateral upper extremity supported;During functional activity Standing balance-Leahy Scale: Poor Standing balance comment: reliant on RW                             Cognition Arousal/Alertness: Awake/alert Behavior During Therapy: Flat affect Overall Cognitive Status: Within Functional Limits for tasks assessed                                        Exercises      General Comments        Pertinent Vitals/Pain Pain Assessment: Faces Faces Pain Scale: Hurts even more Pain Location: sacrum Pain Descriptors / Indicators: Discomfort;Grimacing;Sore Pain Intervention(s): Limited  activity within patient's tolerance;Monitored during session;Repositioned    Home Living                      Prior Function            PT Goals (current goals can now be found in the care plan section) Acute Rehab PT Goals PT Goal Formulation: With patient Time For Goal Achievement: 09/19/17 Potential to Achieve Goals: Good Progress towards PT goals: Progressing toward goals    Frequency    Min 3X/week      PT Plan Discharge plan  needs to be updated    Co-evaluation              AM-PAC PT "6 Clicks" Daily Activity  Outcome Measure  Difficulty turning over in bed (including adjusting bedclothes, sheets and blankets)?: Unable Difficulty moving from lying on back to sitting on the side of the bed? : Unable Difficulty sitting down on and standing up from a chair with arms (e.g., wheelchair, bedside commode, etc,.)?: Unable Help needed moving to and from a bed to chair (including a wheelchair)?: A Little Help needed walking in hospital room?: A Little Help needed climbing 3-5 steps with a railing? : A Lot 6 Click Score: 11    End of Session Equipment Utilized During Treatment: Gait belt Activity Tolerance: Patient tolerated treatment well Patient left: with call bell/phone within reach;in chair Nurse Communication: Mobility status PT Visit Diagnosis: Unsteadiness on feet (R26.81);Muscle weakness (generalized) (M62.81);Other abnormalities of gait and mobility (R26.89);Pain Pain - part of body:  (Sacrum)     Time: 6834-1962 PT Time Calculation (min) (ACUTE ONLY): 35 min  Charges:  $Gait Training: 8-22 mins $Therapeutic Activity: 8-22 mins                    G Codes:       Earney Navy, PTA Pager: [phone]     Darliss Cheney 09/11/2017, 4:06 PM

## 2017-09-11 NOTE — Progress Notes (Signed)
[facility] Progress Note   Subjective: No new complaints. Says he feels better today.   Objective Vitals:   09/10/17 1722 09/10/17 2200 09/11/17 0534 09/11/17 0800  BP: (!) 123/52 107/60 112/62 (!) 103/57  Pulse: 74 73 73 71  Resp: 20 19 19 16   Temp: (!) 97.5 F (36.4 C) 98.5 F (36.9 C) 97.9 F (36.6 C) (!) 97.5 F (36.4 C)  TempSrc: Oral Oral Oral Oral  SpO2: 97% 98% 99% 95%  Weight:  71.2 kg (157 lb)    Height:       Physical Exam General: Chronically ill appearing male, more interactive today. NAD Heart: Regularly irregular. 1/6 systolic M. No JVD.  Lungs: CTAB A/P Abdomen: soft, ostomy LLQ. Edema present RLQ ? Ascites.  Extremities: Edema in hips/thigh. No LE edema. Pressure relieving boots. Necrotic area R great toe.  Dialysis Access: RUA AVF + bruit   Additional Objective Labs: Basic Metabolic Panel:  Recent Labs Lab 09/05/17 0800 09/07/17 1330 09/09/17 0654 09/10/17 0325  NA 130* 127* 125* 128*  K 3.6 3.3* 3.9 3.8  CL 93* 92* 90* 91*  CO2 24 25 24 24   GLUCOSE 112* 167* 115* 160*  BUN 22* 35* 39* 28*  CREATININE 2.62* 3.27* 3.60* 2.57*  CALCIUM 8.1* 8.2* 8.5* 8.3*  PHOS 2.5 2.6 3.1  --    Liver Function Tests:  Recent Labs Lab 09/07/17 1330 09/09/17 0654 09/10/17 0325  AST  --   --  80*  ALT  --   --  30  ALKPHOS  --   --  173*  BILITOT  --   --  2.6*  PROT  --   --  6.8  ALBUMIN 1.9* 2.1* 2.0*   No results for input(s): LIPASE, AMYLASE in the last 168 hours. CBC:  Recent Labs Lab 09/07/17 1330 09/08/17 0225 09/09/17 0344 09/10/17 0325 09/11/17 0242  WBC 6.2 6.8 6.7 6.8 8.5  HGB 10.2* 11.5* 11.1* 10.9* 11.3*  HCT 31.2* 36.1* 35.1* 34.1* 34.9*  MCV 98.4 100.3* 99.4 100.3* 99.1  PLT 65* 66* 78* 88* 94*   Blood Culture    Component Value Date/Time   SDES PLEURAL 09/04/2017 1400   SDES PLEURAL 09/04/2017 1400   SPECREQUEST NONE 09/04/2017 1400   SPECREQUEST NONE 09/04/2017 1400   CULT NO GROWTH 5 DAYS  09/04/2017 1400   REPTSTATUS 09/09/2017 FINAL 09/04/2017 1400   REPTSTATUS 09/04/2017 FINAL 09/04/2017 1400    Cardiac Enzymes: No results for input(s): CKTOTAL, CKMB, CKMBINDEX, TROPONINI in the last 168 hours. CBG:  Recent Labs Lab 09/10/17 0820 09/10/17 1238 09/10/17 1818 09/10/17 2158 09/11/17 0811  GLUCAP 148* 190* 206* 137* 96   Iron Studies: No results for input(s): IRON, TIBC, TRANSFERRIN, FERRITIN in the last 72 hours. @lablastinr3 @ Studies/Results: No results found. Medications: . sodium chloride     . [START ON 09/12/2017] colchicine  0.3 mg Oral Once per day on Mon Thu  . collagenase   Topical Daily  . darbepoetin (ARANESP) injection - DIALYSIS  60 mcg Intravenous Q Wed-HD  . feeding supplement (NEPRO CARB STEADY)  237 mL Oral BID BM  . feeding supplement (PRO-STAT SUGAR FREE 64)  30 mL Oral BID WC  . guaiFENesin  1,200 mg Oral BID  . insulin aspart  0-5 Units Subcutaneous QHS  . insulin aspart  0-9 Units Subcutaneous TID WC  . loratadine  10 mg Oral Daily  . midodrine  10 mg Oral TID WC  . multivitamin  1 tablet Oral QHS  .  ondansetron (ZOFRAN) IV  4 mg Intravenous Q6H  . pantoprazole  40 mg Oral Daily  . sodium chloride flush  3 mL Intravenous Q12H  . Warfarin - Pharmacist Dosing Inpatient   Does not apply q1800     Dialysis Orders: [place]/ [facility] MWF 4h 3K/2.5 77kg Hep none R AVF - Mircera 50 ug q 2, to start 10/3 - Venofer 50/wk  Assessment/Plan: 1. HCAP: Per CT. Was on Vanc/Cefepime -> now Cefepime alone. S/p 1.8L thora 9/30: Sm-mod R pleural effusion on CXR 09/08/17 . 2. ESRD: Continue HD MWF sched. HD tomorrow. K+ 3.8 3.0 K bath.  3.Hypotension/volume. Will need lower EDW on DC. Still has pitting edema abdomen and thigh. Continue attempting to lower volume as BP permits. Chronic hypotension on midodrine.  4. Anemia: Hgb 11.3S/p Aranesp 4mcg 10/3. 5. Secondary hyperparathyroidism:Corr Ca 9.9 change to 2.0 Ca bath. Phos  slightly low. DC binders, liberate diet.  6. Nutrition: Severe PCM. Albumin 2.0  Heart healthy diet. Start prostat.  7. CHF/CM: EF 15-20%, Hx AICD 8. Afib: On warfarin 9. DM: On insulin. 10. Sacral decub - stage IV, f/b WC team.  11. Cirrhosis 12.  Hx rectal Ca - sp diverting colostomy  13.  Dispo - will be dc'd to LTAC eventually per primary. Requesting transfer of dialysis centers (to [facility]). This was approved and will be starting at [facility] on discharge.   Rita H. Brown NP-C 09/11/2017, 10:49 AM  [facility] [phone]  Pt seen, examined and agree w A/P as above.  Kelly Splinter MD [employer] pager [phone]   09/11/2017, 11:52 AM

## 2017-09-11 NOTE — Progress Notes (Addendum)
RN noted coughing after administration of thin liquids. MD Nettey notified, speech eval recommended. Will continue to monitor.  MD verbalized order speech consult

## 2017-09-11 NOTE — Evaluation (Signed)
l Clinical/Bedside Swallow Evaluation Patient Details  Name: [name] MRN: [id number] Date of Birth: 1956-06-25  Today's Date: 09/11/2017 Time: SLP Start Time (ACUTE ONLY): 1555 SLP Stop Time (ACUTE ONLY): 1610 SLP Time Calculation (min) (ACUTE ONLY): 15 min  Past Medical History:  Past Medical History:  Diagnosis Date  . Adenocarcinoma of rectum ([place])    a. 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 AICD (automatic cardioverter/defibrillator) present 2002   BI V ICD  . Anemia   . CAD (coronary artery disease)    a. BMS to LAD 2001 at [facility] b. PTCA/atherectomy ramus and BMS to LAD 2009  . CHF (congestive heart failure) ([place])   . Cholelithiasis 06/2015  . Chronic kidney disease, stage IV (severe) ([place])   . Chronic systolic heart failure ([place])   . Cirrhosis ([place])   . Colostomy in place [facility])   . Dizziness    a. chronic. Admission for this 07/18/2014  . DM type 2, uncontrolled, with renal complications ([place])   . Dysrhythmia   . Essential hypertension, benign   . GERD (gastroesophageal reflux disease)   . HCAP (healthcare-associated pneumonia) 07/21/2015  . Hematuria   . History of blood transfusion    "I've had 2 units so far this year" (09/27/2015)  . HLD (hyperlipidemia)   . Ischemic cardiomyopathy    EF 18% by nuclear study 2016, multiple myocardial infarctions in past    . Myocardial infarction ([place]) 2001  . Obesity   . Orthostatic hypotension   . OSA on CPAP   . Paroxysmal atrial fibrillation (HCC)    a. on amiodarone, digoxin and Eliquis  . PONV (postoperative nausea and vomiting)   . Presence of permanent cardiac pacemaker   . Prostate cancer ([place])    a. s/p seed implants with chemo and radiation  . SAH (subarachnoid hemorrhage) ([place])    post-traumatic (fall) [facility] 12/2014  . TIA (transient ischemic attack)    Past Surgical History:  Past Surgical History:  Procedure Laterality Date  . Abdominal and Perineal Resection of Rectum with Total Mesorectal Excision  10/04/2007  . AV  FISTULA PLACEMENT Right 09/16/2015   Procedure: ARTERIOVENOUS (AV) FISTULA CREATION - BRACHIOCEPHALIC;  Surgeon: Elam Dutch, MD;  Location: [place];  Service: Vascular;  Laterality: Right;  . BI-VENTRICULAR IMPLANTABLE CARDIOVERTER DEFIBRILLATOR  (CRT-D)  2009  . CARDIAC CATHETERIZATION  08/2001; 2009   ; [name] 07/10/2013  . CARDIAC DEFIBRILLATOR PLACEMENT  2002  . CARDIAC DEFIBRILLATOR PLACEMENT  2009   Upgraded to a BiV ICD  . COLONOSCOPY  09/14/2011   Dr. Gala Romney: via colostomy, Single pedunculated benign inflammatory polyp. Due for surveillance Oct 2015  . COLONOSCOPY N/A 07/02/2014   Procedure: COLONOSCOPY;  Surgeon: Daneil Dolin, MD;  [facility];  Service: Endoscopy;  Laterality: N/A;  7:30 / COLONOSCOPY THRU COLOSTOMY  . COLONOSCOPY N/A 12/11/2014   Dr. Gala Romney via colostomy. Normal. Repeat in 2021.   Marland Kitchen COLONOSCOPY N/A 08/24/2015   Dr. Havery Moros: diminutive polyp not removed, stoma site of bleeding  . COLOSTOMY  2008  . CORONARY ANGIOPLASTY WITH STENT PLACEMENT  2001; ~ 2006   "1 + 1"   . ELECTROPHYSIOLOGIC STUDY N/A 08/28/2015   Procedure: AV Node Ablation;  Surgeon: Will Meredith Leeds, MD;  Location: [place] CV LAB;  Service: Cardiovascular;  Laterality: N/A;  . EP IMPLANTABLE DEVICE N/A 04/10/2015   Procedure: Ppm/Biv Ppm Generator Changeout;  Surgeon: Evans Lance, MD;  Location: [place] CV LAB CUPID;  Service: Cardiovascular;  Laterality:  N/A;  . ERCP N/A 06/11/2016   Procedure: ENDOSCOPIC RETROGRADE CHOLANGIOPANCREATOGRAPHY (ERCP);  Surgeon: Teena Irani, MD;  Location: [facility] ENDOSCOPY;  Service: Endoscopy;  Laterality: N/A;  . ERCP N/A 01/26/2017   Procedure: ENDOSCOPIC RETROGRADE CHOLANGIOPANCREATOGRAPHY (ERCP);  Surgeon: Teena Irani, MD;  Location: [facility] ENDOSCOPY;  Service: Endoscopy;  Laterality: N/A;  . ERCP  06/2016   Dr. Amedeo Plenty: duodenal fistula, dilated bile duct but no obvious stones, s/p sphincterotomy and stent placement  . ERCP  01/2017   Dr. Amedeo Plenty: CBD  stones, s/p sphincterotomy, balloon extraction, stent removal  . ESOPHAGOGASTRODUODENOSCOPY N/A 07/02/2014   Procedure: ESOPHAGOGASTRODUODENOSCOPY (EGD);  Surgeon: Daneil Dolin, MD;  [facility];  Service: Endoscopy;  Laterality: N/A;  7:30  . ESOPHAGOGASTRODUODENOSCOPY N/A 12/11/2014   NWG:NFAOZH EGD  . ESOPHAGOGASTRODUODENOSCOPY (EGD) WITH PROPOFOL N/A 04/18/2017   Procedure: ESOPHAGOGASTRODUODENOSCOPY (EGD) WITH PROPOFOL;  Surgeon: Daneil Dolin, MD;  [facility];  Service: Endoscopy;  Laterality: N/A;  2:30pm  . GASTROINTESTINAL STENT REMOVAL N/A 01/26/2017   Procedure: GASTROINTESTINAL STENT REMOVAL;  Surgeon: Teena Irani, MD;  Location: [place];  Service: Endoscopy;  Laterality: N/A;  . GIVENS CAPSULE STUDY N/A 07/23/2015   Dr. Michail Sermon: small non-bleeding AVM, mild gastritis  . IR FLUORO GUIDE CV LINE RIGHT  07/27/2017  . IR REMOVAL TUN CV CATH W/O FL  08/12/2017  . IR US GUIDE VASC ACCESS RIGHT  07/27/2017  . LEFT HEART CATHETERIZATION WITH CORONARY ANGIOGRAM N/A 07/13/2013   Procedure: LEFT HEART CATHETERIZATION WITH CORONARY ANGIOGRAM;  Surgeon: Lorretta Harp, MD;  Location: [facility] CATH LAB;  Service: Cardiovascular;  Laterality: N/A;  . Venia Minks DILATION N/A 07/02/2014   Procedure: Venia Minks DILATION;  Surgeon: Daneil Dolin, MD;  [facility];  Service: Endoscopy;  Laterality: N/A;  7:30  . MALONEY DILATION N/A 04/18/2017   Procedure: Venia Minks DILATION;  Surgeon: Daneil Dolin, MD;  [facility];  Service: Endoscopy;  Laterality: N/A;  . PORTACATH PLACEMENT  06/2007   "removed ~ 1 yr later"  . RIGHT HEART CATHETERIZATION N/A 02/24/2015   Procedure: RIGHT HEART CATH;  Surgeon: Jolaine Artist, MD;  Location: [facility] CATH LAB;  Service: Cardiovascular;  Laterality: N/A;  . SAVORY DILATION N/A 07/02/2014   Procedure: SAVORY DILATION;  Surgeon: Daneil Dolin, MD;  [facility];  Service: Endoscopy;  Laterality: N/A;  7:30   HPI:  Abrahim  C Ashleyis a 61 y.o.male,w Dm2, SAH, Pafib, Mild MR, Mild AR, CAD, CHF (EF 20%) ESRD on HD (M,W, F), apparently c/o cough with yellow sputum and mild hemoptysis. Hewas found to have health care associated pneumonia. Admitted for the management of hcap. Normal EGD 5/18. Most recent chest xray 10/4 (2 view) shows small to moderate layering right pleural effusion and associated right lower lobe obacity, likely atelectasis.   10/7 BS fine crackles, diminished.  Bedside swallow evaluation completed 10/4 with findings of normal oropharyngeal function.  New orders 10/7 for repeat swallow assessment due to nursing observations of increased and consistent coughing with consumption of liquids.    Assessment / Plan / Recommendation Clinical Impression  Pt presents with clinical signs of decline in swallow function since first evaluated  10/4.  Pt drowsy; required cues to maintain eyes open to respond to clinician questions.  No cough observed at baseline; after consumption of thin liquid boluses, pt immediately and consistently coughed, concerning for potential aspiration.  He consumed honey-thick liquids and purees with no overt difficulty.  Oral  function WNL.  For tonight, downgrade diet to dysphagia 3, honey-thick liquids; give meds whole in puree.  SLP will f/u next date to determine if instrumental swallow study is warranted.  D/W pt, RN, who agree with plan.  SLP Visit Diagnosis: Dysphagia, unspecified (R13.10)    Aspiration Risk    TBA   Diet Recommendation   dysphagia 3, honey thick liquids  Medication Administration: Whole meds with puree    Other  Recommendations Oral Care Recommendations: Oral care BID   Follow up Recommendations  (tba)      Frequency and Duration min 2x/week  1 week       Prognosis Prognosis for Safe Diet Advancement: Good      Swallow Study   General HPI: Krishan C Ashleyis a 61 y.o.male,w Dm2, SAH, Pafib, Mild MR, Mild AR, CAD, CHF (EF 20%) ESRD on HD (M,W,  F), apparently c/o cough with yellow sputum and mild hemoptysis. Hewas found to have health care associated pneumonia. Admitted for the management of hcap. Most recent chest xray 10/4  (2 view) shows small to moderate layering right pleural effusion and associated right lower lobe obacity, likely atelectasis.   Bedside swallow evaluation completed 10/4 with findings of normal oropharyngeal function.  New orders 10/7 for repeat swallow assessment due to nursing observations of increased and consistent coughing with consumption of liquids  Type of Study: Bedside Swallow Evaluation Previous Swallow Assessment: 10/4 Diet Prior to this Study: Regular;Thin liquids Temperature Spikes Noted: No Respiratory Status: Room air History of Recent Intubation: No Behavior/Cognition: Lethargic/Drowsy Oral Cavity Assessment: Within Functional Limits Oral Care Completed by SLP: No Oral Cavity - Dentition: Poor condition Vision: Functional for self-feeding Self-Feeding Abilities: Able to feed self Patient Positioning: Upright in bed Baseline Vocal Quality: Normal Volitional Cough: Strong    Oral/Motor/Sensory Function Overall Oral Motor/Sensory Function: Within functional limits   Ice Chips Ice chips: Not tested   Thin Liquid Thin Liquid: Impaired Presentation: Cup;Self Fed Pharyngeal  Phase Impairments: Throat Clearing - Immediate;Other (comments);Wet Vocal Quality (wet cough)    Nectar Thick Nectar Thick Liquid: Not tested   Honey Thick Honey Thick Liquid: Within functional limits   Puree Puree: Within functional limits   Solid   GO   Solid: Within functional limits        [name] 09/11/2017,4:15 PM

## 2017-09-12 ENCOUNTER — Inpatient Hospital Stay (HOSPITAL_COMMUNITY): Payer: Medicare Other

## 2017-09-12 LAB — RENAL FUNCTION PANEL
Albumin: 1.8 g/dL — ABNORMAL LOW (ref 3.5–5.0)
Anion gap: 14 (ref 5–15)
BUN: 64 mg/dL — ABNORMAL HIGH (ref 6–20)
CO2: 22 mmol/L (ref 22–32)
Calcium: 8.7 mg/dL — ABNORMAL LOW (ref 8.9–10.3)
Chloride: 90 mmol/L — ABNORMAL LOW (ref 101–111)
Creatinine, Ser: 3.74 mg/dL — ABNORMAL HIGH (ref 0.61–1.24)
GFR calc Af Amer: 19 mL/min — ABNORMAL LOW (ref >60)
GFR calc non Af Amer: 16 mL/min — ABNORMAL LOW (ref >60)
Glucose, Bld: 136 mg/dL — ABNORMAL HIGH (ref 65–99)
Phosphorus: 4.1 mg/dL (ref 2.5–4.6)
Potassium: 4.5 mmol/L (ref 3.5–5.1)
Sodium: 126 mmol/L — ABNORMAL LOW (ref 135–145)

## 2017-09-12 LAB — CBC
HEMATOCRIT: 34.4 % — AB (ref 39.0–52.0)
HEMOGLOBIN: 11.3 g/dL — AB (ref 13.0–17.0)
MCH: 32.8 pg (ref 26.0–34.0)
MCHC: 32.8 g/dL (ref 30.0–36.0)
MCV: 100 fL (ref 78.0–100.0)
Platelets: 94 10*3/uL — ABNORMAL LOW (ref 150–400)
RBC: 3.44 MIL/uL — AB (ref 4.22–5.81)
RDW: 22.1 % — ABNORMAL HIGH (ref 11.5–15.5)
WBC: 8.2 10*3/uL (ref 4.0–10.5)

## 2017-09-12 LAB — GLUCOSE, CAPILLARY
GLUCOSE-CAPILLARY: 135 mg/dL — AB (ref 65–99)
GLUCOSE-CAPILLARY: 110 mg/dL — AB (ref 65–99)
GLUCOSE-CAPILLARY: 139 mg/dL — AB (ref 65–99)
Glucose-Capillary: 132 mg/dL — ABNORMAL HIGH (ref 65–99)

## 2017-09-12 LAB — PROTIME-INR
INR: 4.21
PROTHROMBIN TIME: 40.3 s — AB (ref 11.4–15.2)

## 2017-09-12 MED ORDER — SODIUM CHLORIDE 0.9 % IV SOLN: 100.0000 mL | INTRAVENOUS | Status: DC | PRN

## 2017-09-12 MED ORDER — PENTAFLUOROPROP-TETRAFLUOROETH EX AERO: 1.0000 "application " | INHALATION_SPRAY | CUTANEOUS | Status: DC | PRN

## 2017-09-12 MED ORDER — HEPARIN SODIUM (PORCINE) 1000 UNIT/ML DIALYSIS: 1000.0000 [IU] | INTRAMUSCULAR | Status: DC | PRN

## 2017-09-12 MED ORDER — MIDODRINE HCL 5 MG PO TABS
ORAL_TABLET | ORAL | Status: AC
  Filled 2017-09-12: qty 2

## 2017-09-12 MED ORDER — ALTEPLASE 2 MG IJ SOLR: 2.0000 mg | Freq: Once | INTRAMUSCULAR | Status: DC | PRN

## 2017-09-12 MED ORDER — LIDOCAINE-PRILOCAINE 2.5-2.5 % EX CREA: 1.0000 "application " | TOPICAL_CREAM | CUTANEOUS | Status: DC | PRN

## 2017-09-12 MED ORDER — LIDOCAINE HCL (PF) 1 % IJ SOLN: 5.0000 mL | INTRAMUSCULAR | Status: DC | PRN

## 2017-09-12 NOTE — Progress Notes (Signed)
PT Cancellation Note  Patient Details Name: TRENTYN BOISCLAIR MRN: [id number] DOB: 1956/08/27   Cancelled Treatment:    Reason Eval/Treat Not Completed: Patient at procedure or test/unavailable. Pt currently in HD. Plan to check back at 1300 for second attempt.    Thelma Comp 09/12/2017, 8:25 AM   Rolinda Roan, PT, DPT Acute Rehabilitation Services Pager: [phone]

## 2017-09-12 NOTE — Progress Notes (Addendum)
PROGRESS NOTE    COLM LYFORD  FAO:[id number] DOB: [birth year] DOA: 09/03/2017 PCP: Redmond School, MD   Brief Narrative: ALPHA MYSLIWIEC is a 61 y.o. male,w Dm2, SAH, Pafib, cirrhosis, Mild MR, Mild AR, CAD, CHF (EF 20%) ESRD on HD (M,W, F), apparently c/o cough with yellow sputum and mild hemoptysis. He was found to have health care associated pneumonia. Admitted for the management of hcap.    Assessment & Plan:   Principal Problem:   HCAP (healthcare-associated pneumonia) Active Problems:   DM type 2, uncontrolled, with renal complications (HCC)   Abnormal LFTs   Hyponatremia   Hemoptysis   Status post thoracentesis   Colostomy in place [facility])   ESRD on dialysis ([place])   Malnutrition of moderate degree   HCAP Patient treated with vancomycin and cefepime. Blood cultures without growth. No sputum culture sent on admission. Concern for worsening infiltrate, however, patient has been afebrile and with a normal WBC. Completed antibiotic course. -sputum gram stain/culture (no sample sent)  Pleural effusion Appears to likely be transudate. Possibly from heart failure. Asymptomatic. Stable.  Sacral decubitus ulcer Unstageable. Afebrile. -hydrotherapy -wound care recommendations: plan for wound vac early next week  Diabetes mellitus -continue SSI  Hypotension Chronic. -continue midodrine  Hemoptysis Resolved. Secondary to HCAP and frequent coughing.  ESRD on hemodialysis -nephrology recommendations  Thrombocytopenia Stable. Chronic.  Leukocytosis Resolved.  Hyponatremia Stable  Atrial fibrillation INR supratherapeutic -continue Coumadin per pharmacy  Cirrhosis Hyperbilirubinemia Bilirubin slightly decreased. Likely made worse by hypotension. Outpatient follow-up  History of rectal carcinoma S/p diverting colostomy. Stable.  Right great toe ulcer Cold foot. Pulses palpable. Previous ABI significant for elevated ABIs secondary to early calcification.  Recurrent ABI similar.   DVT prophylaxis: Coumadin Code Status: Full code Family Communication: None Disposition Plan: Discharge to LTAC if able, otherwise, SNF   Consultants:   Nephrology  Procedures:   Hemodialysis  Antimicrobials:  Vancomycin (9/28>>10/3)  Cefepime (9/29>>   Subjective: No concerns today  Objective: Vitals:   09/12/17 0830 09/12/17 0900 09/12/17 0930 09/12/17 1000  BP: (!) 118/58 (!) 102/46 (!) 89/51 (!) 101/49  Pulse: 73 74 73 73  Resp:      Temp:      TempSrc:      SpO2:      Weight:      Height:        Intake/Output Summary (Last 24 hours) at 09/12/17 1147 Last data filed at 09/12/17 0600  Gross per 24 hour  Intake              240 ml  Output              100 ml  Net              140 ml   Filed Weights   09/09/17 2104 09/10/17 2200  Weight: 70.8 kg (156 lb) 71.2 kg (157 lb)    Examination:  General exam: Appears calm and comfortable Respiratory system: Clear to auscultation. Respiratory effort normal. Cardiovascular system: S1 & S2 heard, RRR. No murmurs Gastrointestinal system: Soft, non-tender, slightly distended, no guarding, no rebound, no masses felt. Colostomy bag. Central nervous system: Alert, oriented Extremities: No calf tenderness Skin: No cyanosis. No rashes. Jaundice.    Data Reviewed: I have personally reviewed following labs and imaging studies  CBC:  Recent Labs Lab 09/08/17 0225 09/09/17 0344 09/10/17 0325 09/11/17 0242 09/12/17 0313  WBC 6.8 6.7 6.8 8.5 8.2  HGB 11.5* 11.1* 10.9* 11.3* 11.3*  HCT 36.1* 35.1* 34.1* 34.9* 34.4*  MCV 100.3* 99.4 100.3* 99.1 100.0  PLT 66* 78* 88* 94* 94*   Basic Metabolic Panel:  Recent Labs Lab 09/07/17 1330 09/09/17 0654 09/10/17 0325 09/12/17 0823  NA 127* 125* 128* 126*  K 3.3* 3.9 3.8 4.5  CL 92* 90* 91* 90*  CO2 25 24 24 22   GLUCOSE 167* 115* 160* 136*  BUN 35* 39* 28* 64*  CREATININE 3.27* 3.60* 2.57* 3.74*  CALCIUM 8.2* 8.5* 8.3* 8.7*  PHOS  2.6 3.1  --  4.1   GFR: Estimated Creatinine Clearance: 20.6 mL/min (A) (by C-G formula based on SCr of 3.74 mg/dL (H)). Liver Function Tests:  Recent Labs Lab 09/07/17 1330 09/09/17 0654 09/10/17 0325 09/12/17 0823  AST  --   --  80*  --   ALT  --   --  30  --   ALKPHOS  --   --  173*  --   BILITOT  --   --  2.6*  --   PROT  --   --  6.8  --   ALBUMIN 1.9* 2.1* 2.0* 1.8*   No results for input(s): LIPASE, AMYLASE in the last 168 hours. No results for input(s): AMMONIA in the last 168 hours. Coagulation Profile:  Recent Labs Lab 09/08/17 0225 09/09/17 0344 09/10/17 0325 09/11/17 0242 09/12/17 0313  INR 2.45 2.49 2.97 3.54 4.21*   Cardiac Enzymes: No results for input(s): CKTOTAL, CKMB, CKMBINDEX, TROPONINI in the last 168 hours. BNP (last 3 results) No results for input(s): PROBNP in the last 8760 hours. HbA1C: No results for input(s): HGBA1C in the last 72 hours. CBG:  Recent Labs Lab 09/11/17 1148 09/11/17 1651 09/11/17 1740 09/11/17 2025 09/12/17 0748  GLUCAP 111* 151* 163* 154* 135*   Lipid Profile: No results for input(s): CHOL, HDL, LDLCALC, TRIG, CHOLHDL, LDLDIRECT in the last 72 hours. Thyroid Function Tests: No results for input(s): TSH, T4TOTAL, FREET4, T3FREE, THYROIDAB in the last 72 hours. Anemia Panel: No results for input(s): VITAMINB12, FOLATE, FERRITIN, TIBC, IRON, RETICCTPCT in the last 72 hours. Sepsis Labs: No results for input(s): PROCALCITON, LATICACIDVEN in the last 168 hours.  Recent Results (from the past 240 hour(s))  Culture, blood (routine x 2) Call MD if unable to obtain prior to antibiotics being given     Status: None   Collection Time: 09/03/17  8:56 AM  Result Value Ref Range Status   Specimen Description BLOOD BLOOD LEFT HAND  Final   Special Requests IN PEDIATRIC BOTTLE Blood Culture adequate volume  Final   Culture NO GROWTH 5 DAYS  Final   Report Status 09/08/2017 FINAL  Final  Culture, blood (routine x 2) Call MD  if unable to obtain prior to antibiotics being given     Status: None   Collection Time: 09/03/17  9:00 AM  Result Value Ref Range Status   Specimen Description BLOOD BLOOD LEFT HAND  Final   Special Requests IN PEDIATRIC BOTTLE Blood Culture adequate volume  Final   Culture NO GROWTH 5 DAYS  Final   Report Status 09/08/2017 FINAL  Final  Respiratory Panel by PCR     Status: None   Collection Time: 09/03/17  5:32 PM  Result Value Ref Range Status   Adenovirus NOT DETECTED NOT DETECTED Final   Coronavirus 229E NOT DETECTED NOT DETECTED Final   Coronavirus HKU1 NOT DETECTED NOT DETECTED Final   Coronavirus NL63 NOT DETECTED NOT DETECTED Final   Coronavirus OC43 NOT DETECTED NOT  DETECTED Final   Metapneumovirus NOT DETECTED NOT DETECTED Final   Rhinovirus / Enterovirus NOT DETECTED NOT DETECTED Final   Influenza A NOT DETECTED NOT DETECTED Final   Influenza B NOT DETECTED NOT DETECTED Final   Parainfluenza Virus 1 NOT DETECTED NOT DETECTED Final   Parainfluenza Virus 2 NOT DETECTED NOT DETECTED Final   Parainfluenza Virus 3 NOT DETECTED NOT DETECTED Final   Parainfluenza Virus 4 NOT DETECTED NOT DETECTED Final   Respiratory Syncytial Virus NOT DETECTED NOT DETECTED Final   Bordetella pertussis NOT DETECTED NOT DETECTED Final   Chlamydophila pneumoniae NOT DETECTED NOT DETECTED Final   Mycoplasma pneumoniae NOT DETECTED NOT DETECTED Final  MRSA PCR Screening     Status: None   Collection Time: 09/03/17  5:32 PM  Result Value Ref Range Status   MRSA by PCR NEGATIVE NEGATIVE Final    Comment:        The GeneXpert MRSA Assay (FDA approved for NASAL specimens only), is one component of a comprehensive MRSA colonization surveillance program. It is not intended to diagnose MRSA infection nor to guide or monitor treatment for MRSA infections.   Culture, body fluid-bottle     Status: None   Collection Time: 09/04/17  2:00 PM  Result Value Ref Range Status   Specimen Description  PLEURAL  Final   Special Requests NONE  Final   Culture NO GROWTH 5 DAYS  Final   Report Status 09/09/2017 FINAL  Final  Gram stain     Status: None   Collection Time: 09/04/17  2:00 PM  Result Value Ref Range Status   Specimen Description PLEURAL  Final   Special Requests NONE  Final   Gram Stain   Final    WBC PRESENT, PREDOMINANTLY PMN NO ORGANISMS SEEN CYTOSPIN SMEAR    Report Status 09/04/2017 FINAL  Final         Radiology Studies: No results found.      Scheduled Meds: . colchicine  0.3 mg Oral Once per day on Mon Thu  . collagenase   Topical Daily  . darbepoetin (ARANESP) injection - DIALYSIS  60 mcg Intravenous Q Wed-HD  . feeding supplement (NEPRO CARB STEADY)  237 mL Oral BID BM  . feeding supplement (PRO-STAT SUGAR FREE 64)  30 mL Oral BID WC  . guaiFENesin  1,200 mg Oral BID  . insulin aspart  0-5 Units Subcutaneous QHS  . insulin aspart  0-9 Units Subcutaneous TID WC  . loratadine  10 mg Oral Daily  . midodrine      . midodrine  10 mg Oral TID WC  . multivitamin  1 tablet Oral QHS  . ondansetron (ZOFRAN) IV  4 mg Intravenous Q6H  . pantoprazole  40 mg Oral Daily  . sodium chloride flush  3 mL Intravenous Q12H  . Warfarin - Pharmacist Dosing Inpatient   Does not apply q1800   Continuous Infusions: . sodium chloride    . sodium chloride    . sodium chloride       LOS: 9 days     Cordelia Poche, MD [facility] 09/12/2017, 11:47 AM Pager: [phone]  If 7PM-7AM, please contact night-coverage [email or web address] Password [email or web address] 09/12/2017, 11:47 AM

## 2017-09-12 NOTE — Care Management Important Message (Signed)
Important Message  Patient Details  Name: [name] MRN: [id number] Date of Birth: 03-17-1956   Medicare Important Message Given:  Yes    [name] May 09/12/2017, 8:18 AM

## 2017-09-12 NOTE — Procedures (Signed)
I was present at this dialysis session. I have reviewed the session itself and made appropriate changes.   RFP pending, start with 3K bath.  Using RUE access.  No c/o from patient.  Goal UF starting at 2.5L.  Cont HD on MWF schedule.    Filed Weights   09/09/17 2104 09/10/17 2200  Weight: 70.8 kg (156 lb) 71.2 kg (157 lb)     Recent Labs Lab 09/09/17 0654 09/10/17 0325  NA 125* 128*  K 3.9 3.8  CL 90* 91*  CO2 24 24  GLUCOSE 115* 160*  BUN 39* 28*  CREATININE 3.60* 2.57*  CALCIUM 8.5* 8.3*  PHOS 3.1  --      Recent Labs Lab 09/10/17 0325 09/11/17 0242 09/12/17 0313  WBC 6.8 8.5 8.2  HGB 10.9* 11.3* 11.3*  HCT 34.1* 34.9* 34.4*  MCV 100.3* 99.1 100.0  PLT 88* 94* 94*    Scheduled Meds: . colchicine  0.3 mg Oral Once per day on Mon Thu  . collagenase   Topical Daily  . darbepoetin (ARANESP) injection - DIALYSIS  60 mcg Intravenous Q Wed-HD  . feeding supplement (NEPRO CARB STEADY)  237 mL Oral BID BM  . feeding supplement (PRO-STAT SUGAR FREE 64)  30 mL Oral BID WC  . guaiFENesin  1,200 mg Oral BID  . insulin aspart  0-5 Units Subcutaneous QHS  . insulin aspart  0-9 Units Subcutaneous TID WC  . loratadine  10 mg Oral Daily  . midodrine  10 mg Oral TID WC  . multivitamin  1 tablet Oral QHS  . ondansetron (ZOFRAN) IV  4 mg Intravenous Q6H  . pantoprazole  40 mg Oral Daily  . sodium chloride flush  3 mL Intravenous Q12H  . Warfarin - Pharmacist Dosing Inpatient   Does not apply q1800   Continuous Infusions: . sodium chloride    . sodium chloride    . sodium chloride     PRN Meds:.sodium chloride, sodium chloride, sodium chloride, albuterol, alteplase, guaiFENesin, heparin, HYDROcodone-acetaminophen, lidocaine (PF), lidocaine-prilocaine, morphine injection, nitroGLYCERIN, pentafluoroprop-tetrafluoroeth, polyethylene glycol, sodium chloride flush   Pearson Grippe  MD 09/12/2017, 8:15 AM

## 2017-09-12 NOTE — Progress Notes (Addendum)
Physical Therapy Wound Treatment and Discharge Patient Details  Name: [name] MRN: [id number] Date of Birth: Nov 23, 1956  Today's Date: 09/12/2017 Time: 1400-1434 Time Calculation (min): 34 min  Subjective  Subjective: Agreeable to hydrotherapy Patient and Family Stated Goals: healed up so I can sit without pain.  Pain Score:   Premedicated  Wound Assessment  Pressure Injury 08/02/17 Unstageable - Full thickness tissue loss in which the base of the ulcer is covered by slough (yellow, tan, gray, green or brown) and/or eschar (tan, brown or black) in the wound bed. present on admission to rehab (Active)  Wound Image     Dressing Type ABD;Gauze (Comment);Moist to dry 09/12/2017  2:04 PM  Dressing Clean;Dry;Intact 09/12/2017  2:04 PM  Dressing Change Frequency Daily 09/12/2017  2:04 PM  State of Healing Eschar 09/12/2017  2:04 PM  Site / Wound Assessment Dressing in place / Unable to assess 09/12/2017  2:04 PM  % Wound base Red or Granulating 60% 09/12/2017  2:04 PM  % Wound base Yellow/Fibrinous Exudate 20% 09/12/2017  2:04 PM  % Wound base Black/Eschar 0% 09/12/2017  2:04 PM  % Wound base Other/Granulation Tissue (Comment) 20% 09/12/2017  2:04 PM  Peri-wound Assessment Erythema (blanchable);Intact 09/12/2017  2:04 PM  Wound Length (cm) 7 cm 09/06/2017 11:24 AM  Wound Width (cm) 5 cm 09/06/2017 11:24 AM  Wound Depth (cm) 2.5 cm 09/06/2017 11:24 AM  Wound Surface Area (cm^2) 35 cm^2 09/06/2017 11:24 AM  Wound Volume (cm^3) 87.5 cm^3 09/06/2017 11:24 AM  Margins Unattached edges (unapproximated) 09/12/2017  2:04 PM  Drainage Amount Moderate 09/12/2017  2:04 PM  Drainage Description Serous 09/12/2017  2:04 PM  Treatment Debridement (Selective);Hydrotherapy (Pulse lavage);Packing (Saline gauze) 09/12/2017  2:04 PM  Santyl applied to wound bed prior to applying dressing.   Hydrotherapy Pulsed lavage therapy - wound location: Sacral Pulsed Lavage with Suction (psi): 12 psi Pulsed Lavage with  Suction - Normal Saline Used: 1000 mL Pulsed Lavage Tip: Tip with splash shield Selective Debridement Selective Debridement - Location: sacrum Selective Debridement - Tools Used: Forceps;Scissors Selective Debridement - Tissue Removed: Thick yellow slough   Wound Assessment and Plan  Wound Therapy - Assess/Plan/Recommendations Wound Therapy - Clinical Statement: Wound fairly clean, wound base with fascia present, some yellow slough present. Feel wound is ready for wound VAC - discussed with Melody, WOC. Will d/c from hydrotherapy at this time as plan is for [facility] placement tomorrow morning. If needs change, please reconsult hydrotherapy.  Wound Therapy - Functional Problem List: Decreased mobility (see PT notes),  Factors Delaying/Impairing Wound Healing: Diabetes Mellitus;Immobility;Multiple medical problems;Other (comment) Hydrotherapy Plan: Debridement;Dressing change;Patient/family education;Pulsatile lavage with suction Wound Therapy - Frequency: 6X / week Wound Therapy - Current Recommendations: Case manager/social work;PT Wound Therapy - Follow Up Recommendations: Skilled nursing facility Wound Plan: see above  Wound Therapy Goals- Improve the function of patient's integumentary system by progressing the wound(s) through the phases of wound healing (inflammation - proliferation - remodeling) by: Decrease Necrotic Tissue to: 10% Decrease Necrotic Tissue - Progress: Progressing toward goal Increase Granulation Tissue to: 90% Increase Granulation Tissue - Progress: Progressing toward goal Goals/treatment plan/discharge plan were made with and agreed upon by patient/family: Yes Time For Goal Achievement: 7 days Wound Therapy - Potential for Goals: Good  Goals will be updated until maximal potential achieved or discharge criteria met.  Discharge criteria: when goals achieved, discharge from hospital, MD decision/surgical intervention, no progress towards goals, refusal/missing three  consecutive treatments without notification or medical reason.  GP     Thelma Comp 09/12/2017, 2:46 PM   Rolinda Roan, PT, DPT Acute Rehabilitation Services Pager: [phone]

## 2017-09-12 NOTE — Progress Notes (Signed)
ANTICOAGULATION CONSULT NOTE - Follow Up Consult  Pharmacy Consult for Coumadin Indication: atrial fibrillation  No Known Allergies  Patient Measurements: Height: 5\' 10"  (177.8 cm) Weight: 160 lb (72.6 kg) IBW/kg (Calculated) : 73  Vital Signs: Temp: 97.5 F (36.4 C) (10/08 1225) Temp Source: Oral (10/08 1225) BP: 111/53 (10/08 1225) Pulse Rate: 75 (10/08 1225)  Labs:  Recent Labs  09/10/17 0325 09/11/17 0242 09/12/17 0313 09/12/17 0823  HGB 10.9* 11.3* 11.3*  --   HCT 34.1* 34.9* 34.4*  --   PLT 88* 94* 94*  --   LABPROT 30.7* 35.2* 40.3*  --   INR 2.97 3.54 4.21*  --   CREATININE 2.57*  --   --  3.74*    Estimated Creatinine Clearance: 21 mL/min (A) (by C-G formula based on SCr of 3.74 mg/dL (H)).  Assessment:  Continues on Coumadin for atrial fibrillation.   INR therapeutic (2.37) on admit 09/03/17, but trended up to supratherapeutic range on 1 mg daily (9/29>>10/6).  Coumadin held on 10/7 when INR 3.54, but up further to 4.21 today.  Hgb stable, no drop in platelet count.  Noted to have chronic thrombocytopenia with platelets down to 66K on 10/4, but back up to 94 on 10/7 and again 10/8. As previously discussed with Dr. Lonny Prude, we will watch platelet count and consider discussing warfarin interruption if platelet count falls <50K.  Last total bilirubin 2.6 on 10/6. On dysphagia 3 diet; little PO intake.    Home Coumadin regimen: 1 mg daily except 2 mg on Fridays.  Goal of Therapy:  INR 2-3 Monitor platelets by anticoagulation protocol: Yes   Plan:   No Coumadin again today.  Daily PT/INR.  Arty Baumgartner, [place] Pager: [phone] 09/12/2017,3:05 PM

## 2017-09-12 NOTE — NC FL2 (Signed)
[place] LEVEL OF CARE SCREENING TOOL     IDENTIFICATION  Patient Name: [name] Birthdate: October 14, 1956 Sex: male Admission Date (Current Location): 09/03/2017  [facility] and [place] Number:  Herbalist and Address:  [facility], [place] [address] [place], [place] [address]      Provider Number: [id number]  Attending Physician Name and Address:  Mariel Aloe, MD  Relative Name and Phone Number:       Current Level of Care: Hospital Recommended Level of Care: [place] Prior Approval Number:    Date Approved/Denied:   PASRR Number: [id number]  Discharge Plan: SNF    Current Diagnoses: Patient Active Problem List   Diagnosis Date Noted  . Malnutrition of moderate degree 09/06/2017  . Status post thoracentesis   . Colostomy in place [facility])   . ESRD on dialysis ([place])   . Hyponatremia 09/03/2017  . Hemoptysis 09/03/2017  . Hypoglycemia   . Severe protein-calorie malnutrition ([place])   . Acute blood loss anemia   . Type 2 diabetes mellitus with peripheral neuropathy (HCC)   . Chronic systolic heart failure ([place])   . Colostomy care ([place])   . Chronic kidney disease (CKD), stage IV (severe) ([place])   . Dependence on renal dialysis ([place])   . Wounds, multiple   . Supratherapeutic INR   . MSSA bacteremia 08/02/2017  . ESRD (end stage renal disease) ([place])   . Debility   . Acute on chronic systolic CHF (congestive heart failure) ([place])   . Bacteremia due to methicillin susceptible Staphylococcus aureus (MSSA)   . Infected defibrillator ([place])   . Septic hip ([place])   . Staphylococcus aureus bacteremia with sepsis ([place])   . Severe sepsis with septic shock ([place])   . Cardiogenic shock ([place]) 07/10/2017  . Septic shock ([place]) 07/10/2017  . Cardiorenal syndrome with renal failure   . Thrombocytopenia ([place]) 06/14/2017  . GI bleed 04/27/2017  . Portal hypertensive gastropathy ([place]) 04/27/2017  . Hypokalemia  04/27/2017  . Esophageal dysphagia 04/13/2017  . Hepatic cirrhosis ([place]) 02/24/2017  . Acute on chronic systolic heart failure ([place])   . Biliary disease with obstruction   . AKI (acute kidney injury) ([place])   . Abnormal LFTs 05/10/2016  . Pain in the chest   . Right heart failure ([place]) 09/26/2015  . History of colonic polyps 09/24/2015  . Chronic atrial fibrillation ([place])   . Bleeding from colostomy stoma ([employer] [facility]) 08/23/2015  . Anemia of chronic disease 07/21/2015  . HCAP (healthcare-associated pneumonia) 07/21/2015  . Constipation 05/01/2015  . Transaminitis 05/01/2015  . NSTEMI- 04/23/15- Myoview scar- no cath 04/27/2015  . Acute respiratory failure ([place]) 04/23/2015  . Chronic cholecystitis 04/04/2015  . Cystitis with hematuria-April 2016 04/04/2015  . Chronically elevated transaminase level-Amiodarone resumed 04/24/15 04/03/2015  . H/O ventricular fibrillation-March 2016 02/24/2015  . BiV ICD (St Jude) gen change 04/10/15 02/24/2015  . Hyperlipidemia   . Dietary noncompliance   . Orthostatic hypotension 07/19/2014  . OSA on CPAP   . GERD (gastroesophageal reflux disease) 06/05/2014  . Gallbladder polyp 03/06/2014  . DM type 2, uncontrolled, with renal complications ([place]) 37/16/9678  . Dizziness 01/26/2014  . Obesity 12/12/2013  . Chronic hypotension 12/11/2013  . Chronic kidney disease, stage IV (severe) ([place]) 12/11/2013  . Chronic a-fib ([place])   . CAD with LCX disease and stents to LAD 07/10/2013  . Long term current use of anticoagulant therapy 03/23/2013  . History of rectal cancer   .  Ischemic cardiomyopathy-EF 35% 04/24/15 echo     Orientation RESPIRATION BLADDER Height & Weight     Self, Situation, Time, Place  Normal Continent Weight: 160 lb (72.6 kg) Height:  5\' 10"  (177.8 cm)  BEHAVIORAL SYMPTOMS/MOOD NEUROLOGICAL BOWEL NUTRITION STATUS      Colostomy Diet (cardiac- fluid restricted 1200)  AMBULATORY STATUS COMMUNICATION OF NEEDS Skin   Limited Assist Verbally PU  Stage and Appropriate Care, Wound Vac (located on stage IV sacral wound)       PU Stage 4 Dressing:  (6cm x 5cm x 1.2) however much deeper now that slough has been cleared- palpable bone- wound vac to be placed 10/9)               Personal Care Assistance Level of Assistance  Bathing, Dressing Bathing Assistance: Limited assistance   Dressing Assistance: Limited assistance     Functional Limitations Info             Shields  PT (By licensed PT), OT (By licensed OT)     PT Frequency: 5/wk OT Frequency: 5/wk            Contractures      Additional Factors Info  Code Status Code Status Info: FULL             Current Medications (09/12/2017):  This is the current hospital active medication list Current Facility-Administered Medications  Medication Dose Route Frequency Provider Last Rate Last Dose  . 0.9 %  sodium chloride infusion  250 mL Intravenous PRN Jani Gravel, MD      . albuterol (PROVENTIL) (2.5 MG/3ML) 0.083% nebulizer solution 2.5 mg  2.5 mg Inhalation Q6H PRN Jani Gravel, MD      . colchicine tablet 0.3 mg  0.3 mg Oral Once per day on Mon Thu Mariel Aloe, MD      . collagenase (SANTYL) ointment   Topical Daily Jani Gravel, MD      . Darbepoetin Alfa (ARANESP) injection 60 mcg  60 mcg Intravenous Q Wed-HD Loren Racer, PA-C   60 mcg at 09/07/17 1734  . feeding supplement (NEPRO CARB STEADY) liquid 237 mL  237 mL Oral BID BM [name], PA-C   237 mL at 09/11/17 1058  . feeding supplement (PRO-STAT SUGAR FREE 64) liquid 30 mL  30 mL Oral BID WC Hosie Poisson, MD   30 mL at 09/11/17 1640  . guaiFENesin (MUCINEX) 12 hr tablet 1,200 mg  1,200 mg Oral BID Mariel Aloe, MD   1,200 mg at 09/11/17 2126  . guaiFENesin (ROBITUSSIN) 100 MG/5ML solution 100 mg  5 mL Oral Q4H PRN Blount, Xenia T, NP   100 mg at 09/11/17 1640  . HYDROcodone-acetaminophen (NORCO) 7.5-325 MG per tablet 1-2 tablet  1-2 tablet Oral Q4H PRN Jani Gravel, MD   1 tablet at 09/09/17 2213  . insulin aspart (novoLOG) injection 0-5 Units  0-5 Units Subcutaneous QHS Jani Gravel, MD      . insulin aspart (novoLOG) injection 0-9 Units  0-9 Units Subcutaneous TID WC Jani Gravel, MD   2 Units at 09/11/17 1717  . loratadine (CLARITIN) tablet 10 mg  10 mg Oral Daily Jani Gravel, MD   10 mg at 09/11/17 1048  . midodrine (PROAMATINE) tablet 10 mg  10 mg Oral TID WC Jani Gravel, MD   10 mg at 09/12/17 1140  . morphine 2 MG/ML injection 1-2 mg  1-2 mg Intravenous Q4H PRN Hosie Poisson, MD  2 mg at 09/12/17 1345  . multivitamin (RENA-VIT) tablet 1 tablet  1 tablet Oral Loma Sousa, MD   1 tablet at 09/11/17 2126  . nitroGLYCERIN (NITROSTAT) SL tablet 0.4 mg  0.4 mg Sublingual Q5 min PRN Jani Gravel, MD      . ondansetron [name]) injection 4 mg  4 mg Intravenous Q6H Blount, Xenia T, NP   4 mg at 09/11/17 1839  . pantoprazole (PROTONIX) EC tablet 40 mg  40 mg Oral Daily Jani Gravel, MD   40 mg at 09/11/17 1048  . polyethylene glycol (MIRALAX / GLYCOLAX) packet 17 g  17 g Oral Daily PRN Jani Gravel, MD      . sodium chloride flush (NS) 0.9 % injection 3 mL  3 mL Intravenous Q12H Jani Gravel, MD   3 mL at 09/11/17 2132  . sodium chloride flush (NS) 0.9 % injection 3 mL  3 mL Intravenous PRN Jani Gravel, MD      . Warfarin - Pharmacist Dosing Inpatient   Does not apply q1800 Briscoe Burns, [facility]   Stopped at 09/11/17 1800     Discharge Medications: Please see discharge summary for a list of discharge medications.  Relevant Imaging Results:  Relevant Lab Results:   Additional Information SS#: [id number]; Dialysis MWF at [facility], Connye Burkitt, LCSW

## 2017-09-12 NOTE — Progress Notes (Signed)
CRITICAL VALUE ALERT  Critical Value:  INR 4.21  Date & Time Notied:  09/12/2017  0525  Provider Notified: Kennon Holter  Orders Received/Actions taken: No new orders, informed oncoming shift may need to hold Coumadin

## 2017-09-13 ENCOUNTER — Inpatient Hospital Stay (HOSPITAL_COMMUNITY): Payer: Medicare Other

## 2017-09-13 LAB — CBC
HCT: 34.1 % — ABNORMAL LOW (ref 39.0–52.0)
HEMOGLOBIN: 11 g/dL — AB (ref 13.0–17.0)
MCH: 32.3 pg (ref 26.0–34.0)
MCHC: 32.3 g/dL (ref 30.0–36.0)
MCV: 100 fL (ref 78.0–100.0)
Platelets: 83 10*3/uL — ABNORMAL LOW (ref 150–400)
RBC: 3.41 MIL/uL — ABNORMAL LOW (ref 4.22–5.81)
RDW: 22.2 % — ABNORMAL HIGH (ref 11.5–15.5)
WBC: 8.1 10*3/uL (ref 4.0–10.5)

## 2017-09-13 LAB — CUP PACEART REMOTE DEVICE CHECK
Date Time Interrogation Session: 20181009085827
Implantable Lead Implant Date: 20091026
Implantable Lead Location: 753859
Implantable Lead Model: 148
Implantable Lead Model: 4196
Implantable Pulse Generator Implant Date: 20160505
MDC IDC LEAD IMPLANT DT: 20020906
MDC IDC LEAD IMPLANT DT: 20091026
MDC IDC LEAD LOCATION: 753858
MDC IDC LEAD LOCATION: 753860
MDC IDC LEAD SERIAL: 125581
MDC IDC PG SERIAL: 7238042

## 2017-09-13 LAB — PROTIME-INR
INR: 3.11
PROTHROMBIN TIME: 31.8 s — AB (ref 11.4–15.2)

## 2017-09-13 LAB — GLUCOSE, CAPILLARY
GLUCOSE-CAPILLARY: 157 mg/dL — AB (ref 65–99)
GLUCOSE-CAPILLARY: 137 mg/dL — AB (ref 65–99)
Glucose-Capillary: 157 mg/dL — ABNORMAL HIGH (ref 65–99)
Glucose-Capillary: 149 mg/dL — ABNORMAL HIGH (ref 65–99)

## 2017-09-13 MED ORDER — PRO-STAT SUGAR FREE PO LIQD
30.0000 mL | Freq: Three times a day (TID) | ORAL | Status: DC
  Administered 2017-09-13 – 2017-09-15 (×4): 30 mL via ORAL
  Filled 2017-09-13 (×4): qty 30

## 2017-09-13 NOTE — Progress Notes (Signed)
Physical Therapy Treatment Patient Details Name: [name] MRN: [id number] DOB: [birth year] Today's Date: 09/13/2017    History of Present Illness  [name] is a 61 y.o. male with ESRD, HD initiated 07/2017 during recent [facility] admit with MSSA bacteremia/shock. PMH includes  HTN, Type 2 DM, Hx Rectal cancer (s/p colostomy 2008), Afib (on warfarin), CAD (s/p stents), ischemic CM (EF 15-20%, ICD in place), OSA on CPAP, cirrhosis, thrombocytopenia. Admitted with productive cough and hemoptysis; suspicious for pneumonia; order for PT Hydro to follow for sacral ulcer.    PT Comments    Continuing work on functional mobility and activity tolerance;  Able to go further in hallway today, and required less seated rest breaks, though longer seated rest breaks; Overall, noting decline in functional capacity compared to original eval this admission; updating DC recs to SNF for post-acute rehab  Follow Up Recommendations  SNF     Equipment Recommendations  None recommended by PT    Recommendations for Other Services       Precautions / Restrictions Precautions Precautions: Fall Precaution Comments: sacral ulcer    Mobility  Bed Mobility Overal bed mobility: Needs Assistance Bed Mobility: Rolling;Sidelying to Sit Rolling: Min assist Sidelying to sit: Min assist       General bed mobility comments: cues for sequencing and assist to bring toward EOB with use of chuck pad, to bring bilat LE to EOB, and to elevate trunk into sitting  Transfers Overall transfer level: Needs assistance Equipment used: Rolling walker (2 wheeled) Transfers: Sit to/from Stand Sit to Stand: Min assist;Min guard         General transfer comment: assist to power up into standing from EOB and recliner; cues for safe hand placement  Ambulation/Gait Ambulation/Gait assistance: Min assist;+2 safety/equipment Ambulation Distance (Feet):  (Hallway ambulation with 2 seated rest breaks) Assistive device:  Rolling walker (2 wheeled) Gait Pattern/deviations: Step-through pattern;Decreased step length - right;Decreased step length - left;Trunk flexed Gait velocity: slowed   General Gait Details: cues for posture; pt with guarded movements; SOB at times    Stairs            Wheelchair Mobility    Modified Rankin (Stroke Patients Only)       Balance     Sitting balance-Leahy Scale: Good Sitting balance - Comments: uncomfortable due to wound     Standing balance-Leahy Scale: Poor Standing balance comment: reliant on RW                             Cognition Arousal/Alertness: Awake/alert Behavior During Therapy: WFL for tasks assessed/performed Overall Cognitive Status: Within Functional Limits for tasks assessed                                        Exercises      General Comments        Pertinent Vitals/Pain Pain Assessment: Faces Faces Pain Scale: Hurts even more Pain Location: sacrum Pain Descriptors / Indicators: Discomfort;Grimacing;Sore Pain Intervention(s): Limited activity within patient's tolerance;Monitored during session;Repositioned;RN gave pain meds during session    Home Living                      Prior Function            PT Goals (current goals can now be found in the care  plan section) Acute Rehab PT Goals Patient Stated Goal: get back to normal PT Goal Formulation: With patient Time For Goal Achievement: 09/19/17 Potential to Achieve Goals: Good Progress towards PT goals: Progressing toward goals    Frequency    Min 3X/week      PT Plan Discharge plan needs to be updated    Co-evaluation              AM-PAC PT "6 Clicks" Daily Activity  Outcome Measure  Difficulty turning over in bed (including adjusting bedclothes, sheets and blankets)?: A Lot Difficulty moving from lying on back to sitting on the side of the bed? : A Lot Difficulty sitting down on and standing up from a chair  with arms (e.g., wheelchair, bedside commode, etc,.)?: A Little Help needed moving to and from a bed to chair (including a wheelchair)?: A Little Help needed walking in hospital room?: A Little Help needed climbing 3-5 steps with a railing? : A Lot 6 Click Score: 15    End of Session Equipment Utilized During Treatment: Gait belt Activity Tolerance: Patient tolerated treatment well Patient left: with call bell/phone within reach;in chair Nurse Communication: Mobility status PT Visit Diagnosis: Unsteadiness on feet (R26.81);Muscle weakness (generalized) (M62.81);Other abnormalities of gait and mobility (R26.89);Pain Pain - part of body:  (sacrum)     Time: 1418-1500 PT Time Calculation (min) (ACUTE ONLY): 42 min  Charges:  $Gait Training: 23-37 mins $Therapeutic Activity: 8-22 mins                    G Codes:       Roney Marion, PT  Acute Rehabilitation Services Pager [phone] Office [place] 09/13/2017, 4:24 PM

## 2017-09-13 NOTE — Progress Notes (Signed)
PROGRESS NOTE    [name]  OJJ:[id number] DOB: June 26, 1956 DOA: 09/03/2017 PCP: Redmond School, MD   Brief Narrative: [name] is a 61 y.o. male,w Dm2, SAH, Pafib, cirrhosis, Mild MR, Mild AR, CAD, CHF (EF 20%) ESRD on HD (M,W, F), apparently c/o cough with yellow sputum and mild hemoptysis. He was found to have health care associated pneumonia. Admitted for the management of hcap.    Assessment & Plan:   Principal Problem:   HCAP (healthcare-associated pneumonia) Active Problems:   DM type 2, uncontrolled, with renal complications (HCC)   Abnormal LFTs   Hyponatremia   Hemoptysis   Status post thoracentesis   Colostomy in place [facility])   ESRD on dialysis ([place])   Malnutrition of moderate degree   HCAP Aspiration pneumonia Patient treated with vancomycin and cefepime. Blood cultures without growth. No sputum culture sent on admission. Concern for worsening infiltrate, however, patient has been afebrile and with a normal WBC. Completed antibiotic course. -sputum gram stain/culture (no sample sent)  Dysphagia -SLP recommendations: barium swallow today -continue dysphagia 3 diet  Pleural effusion Appears to likely be transudate. Possibly from heart failure. Asymptomatic. Stable.  Sacral decubitus ulcer Unstageable. Afebrile. -hydrotherapy -wound care recommendations: plan for wound vac  Diabetes mellitus -continue SSI  Hypotension Chronic. -continue midodrine  Hemoptysis Resolved. Secondary to HCAP and frequent coughing.  ESRD on hemodialysis -nephrology recommendations  Thrombocytopenia Stable. Chronic.  Leukocytosis Resolved.  Hyponatremia Stable  Atrial fibrillation INR supratherapeutic. Improving. -continue Coumadin per pharmacy  Cirrhosis Hyperbilirubinemia Bilirubin slightly decreased. Likely made worse by hypotension. Outpatient follow-up  History of rectal carcinoma S/p diverting colostomy. Stable.  Right great toe  ulcer Cold foot. Pulses palpable. Previous ABI significant for elevated ABIs secondary to early calcification. Recurrent ABI similar.  Hyponatremia Chronic. Asymptomatic. Patient with cirrhosis, ESRD and heart failure -dialysis  Chronic systolic heart failure EF of 15-20% with diffuse hypokinesis and apical akinesis. -dialysis for volume maintenance -decline palliative care consult   DVT prophylaxis: Coumadin Code Status: Full code Family Communication: Wife not at bedside today Disposition Plan: Discharge to SNF (unable to go to [facility])   Consultants:   Nephrology  Procedures:   Hemodialysis  Antimicrobials:  Vancomycin (9/28>>10/3)  Cefepime (9/29>>   Subjective: Coughing improved.  Objective: Vitals:   09/12/17 1225 09/12/17 1705 09/12/17 2004 09/13/17 0446  BP: (!) 111/53 (!) 94/52 (!) 90/48 (!) 94/57  Pulse: 75 74 74 77  Resp: 16 16 14 16   Temp: (!) 97.5 F (36.4 C) 98.3 F (36.8 C) 97.9 F (36.6 C) 97.8 F (36.6 C)  TempSrc: Oral Oral    SpO2: 90% 93% 100% 100%  Weight: 72.6 kg (160 lb)     Height:        Intake/Output Summary (Last 24 hours) at 09/13/17 0926 Last data filed at 09/13/17 0200  Gross per 24 hour  Intake              300 ml  Output             2625 ml  Net            -2325 ml   Filed Weights   09/09/17 2104 09/10/17 2200 09/12/17 1225  Weight: 70.8 kg (156 lb) 71.2 kg (157 lb) 72.6 kg (160 lb)    Examination:  General exam: Appears calm and comfortable Respiratory system: Clear to auscultation. Diminished Respiratory effort normal. Cardiovascular system: S1 & S2 heard, RRR. No murmurs Gastrointestinal system: Soft, non-tender, slightly distended,  no guarding, no rebound. Colostomy bag. Central nervous system: Alert, oriented Extremities: No calf tenderness Skin: No cyanosis. No rashes.    Data Reviewed: I have personally reviewed following labs and imaging studies  CBC:  Recent Labs Lab 09/09/17 0344 09/10/17 0325  09/11/17 0242 09/12/17 0313 09/13/17 0502  WBC 6.7 6.8 8.5 8.2 8.1  HGB 11.1* 10.9* 11.3* 11.3* 11.0*  HCT 35.1* 34.1* 34.9* 34.4* 34.1*  MCV 99.4 100.3* 99.1 100.0 100.0  PLT 78* 88* 94* 94* 83*   Basic Metabolic Panel:  Recent Labs Lab 09/07/17 1330 09/09/17 0654 09/10/17 0325 09/12/17 0823  NA 127* 125* 128* 126*  K 3.3* 3.9 3.8 4.5  CL 92* 90* 91* 90*  CO2 25 24 24 22   GLUCOSE 167* 115* 160* 136*  BUN 35* 39* 28* 64*  CREATININE 3.27* 3.60* 2.57* 3.74*  CALCIUM 8.2* 8.5* 8.3* 8.7*  PHOS 2.6 3.1  --  4.1   GFR: Estimated Creatinine Clearance: 21 mL/min (A) (by C-G formula based on SCr of 3.74 mg/dL (H)). Liver Function Tests:  Recent Labs Lab 09/07/17 1330 09/09/17 0654 09/10/17 0325 09/12/17 0823  AST  --   --  80*  --   ALT  --   --  30  --   ALKPHOS  --   --  173*  --   BILITOT  --   --  2.6*  --   PROT  --   --  6.8  --   ALBUMIN 1.9* 2.1* 2.0* 1.8*   No results for input(s): LIPASE, AMYLASE in the last 168 hours. No results for input(s): AMMONIA in the last 168 hours. Coagulation Profile:  Recent Labs Lab 09/09/17 0344 09/10/17 0325 09/11/17 0242 09/12/17 0313 09/13/17 0502  INR 2.49 2.97 3.54 4.21* 3.11   Cardiac Enzymes: No results for input(s): CKTOTAL, CKMB, CKMBINDEX, TROPONINI in the last 168 hours. BNP (last 3 results) No results for input(s): PROBNP in the last 8760 hours. HbA1C: No results for input(s): HGBA1C in the last 72 hours. CBG:  Recent Labs Lab 09/12/17 0748 09/12/17 1455 09/12/17 1708 09/12/17 2133 09/13/17 0746  GLUCAP 135* 110* 139* 132* 157*   Lipid Profile: No results for input(s): CHOL, HDL, LDLCALC, TRIG, CHOLHDL, LDLDIRECT in the last 72 hours. Thyroid Function Tests: No results for input(s): TSH, T4TOTAL, FREET4, T3FREE, THYROIDAB in the last 72 hours. Anemia Panel: No results for input(s): VITAMINB12, FOLATE, FERRITIN, TIBC, IRON, RETICCTPCT in the last 72 hours. Sepsis Labs: No results for input(s):  PROCALCITON, LATICACIDVEN in the last 168 hours.  Recent Results (from the past 240 hour(s))  Respiratory Panel by PCR     Status: None   Collection Time: 09/03/17  5:32 PM  Result Value Ref Range Status   Adenovirus NOT DETECTED NOT DETECTED Final   Coronavirus 229E NOT DETECTED NOT DETECTED Final   Coronavirus HKU1 NOT DETECTED NOT DETECTED Final   Coronavirus NL63 NOT DETECTED NOT DETECTED Final   Coronavirus OC43 NOT DETECTED NOT DETECTED Final   Metapneumovirus NOT DETECTED NOT DETECTED Final   Rhinovirus / Enterovirus NOT DETECTED NOT DETECTED Final   Influenza A NOT DETECTED NOT DETECTED Final   Influenza B NOT DETECTED NOT DETECTED Final   Parainfluenza Virus 1 NOT DETECTED NOT DETECTED Final   Parainfluenza Virus 2 NOT DETECTED NOT DETECTED Final   Parainfluenza Virus 3 NOT DETECTED NOT DETECTED Final   Parainfluenza Virus 4 NOT DETECTED NOT DETECTED Final   Respiratory Syncytial Virus NOT DETECTED NOT DETECTED Final  Bordetella pertussis NOT DETECTED NOT DETECTED Final   Chlamydophila pneumoniae NOT DETECTED NOT DETECTED Final   Mycoplasma pneumoniae NOT DETECTED NOT DETECTED Final  MRSA PCR Screening     Status: None   Collection Time: 09/03/17  5:32 PM  Result Value Ref Range Status   MRSA by PCR NEGATIVE NEGATIVE Final    Comment:        The GeneXpert MRSA Assay (FDA approved for NASAL specimens only), is one component of a comprehensive MRSA colonization surveillance program. It is not intended to diagnose MRSA infection nor to guide or monitor treatment for MRSA infections.   Culture, body fluid-bottle     Status: None   Collection Time: 09/04/17  2:00 PM  Result Value Ref Range Status   Specimen Description PLEURAL  Final   Special Requests NONE  Final   Culture NO GROWTH 5 DAYS  Final   Report Status 09/09/2017 FINAL  Final  Gram stain     Status: None   Collection Time: 09/04/17  2:00 PM  Result Value Ref Range Status   Specimen Description PLEURAL   Final   Special Requests NONE  Final   Gram Stain   Final    WBC PRESENT, PREDOMINANTLY PMN NO ORGANISMS SEEN CYTOSPIN SMEAR    Report Status 09/04/2017 FINAL  Final         Radiology Studies: No results found.      Scheduled Meds: . colchicine  0.3 mg Oral Once per day on Mon Thu  . darbepoetin (ARANESP) injection - DIALYSIS  60 mcg Intravenous Q Wed-HD  . feeding supplement (NEPRO CARB STEADY)  237 mL Oral BID BM  . feeding supplement (PRO-STAT SUGAR FREE 64)  30 mL Oral BID WC  . guaiFENesin  1,200 mg Oral BID  . insulin aspart  0-5 Units Subcutaneous QHS  . insulin aspart  0-9 Units Subcutaneous TID WC  . loratadine  10 mg Oral Daily  . midodrine  10 mg Oral TID WC  . multivitamin  1 tablet Oral QHS  . ondansetron (ZOFRAN) IV  4 mg Intravenous Q6H  . pantoprazole  40 mg Oral Daily  . sodium chloride flush  3 mL Intravenous Q12H  . Warfarin - Pharmacist Dosing Inpatient   Does not apply q1800   Continuous Infusions: . sodium chloride       LOS: 10 days     Cordelia Poche, MD [facility] 09/13/2017, 9:26 AM Pager: [phone]  If 7PM-7AM, please contact night-coverage [email or web address] Password [email or web address] 09/13/2017, 9:26 AM

## 2017-09-13 NOTE — Progress Notes (Signed)
Occupational Therapy Progress Note  Pt very fatigued this pm.  Attempted sit to stand, but he was unable even with max A.  Attempted using stedy, but pt unable.  Assisted pt with repositioning in  Recliner in prep for dinner.  Recommend use of maxi move for back to bed.  Will continue to follow.     09/13/17 1624  OT Visit Information  Last OT Received On 09/13/17  Assistance Needed +2  History of Present Illness [name] is a 61 y.o. male with ESRD, HD initiated 07/2017 during recent [facility] admit with MSSA bacteremia/shock. PMH includes  HTN, Type 2 DM, Hx Rectal cancer (s/p colostomy 2008), Afib (on warfarin), CAD (s/p stents), ischemic CM (EF 15-20%, ICD in place), OSA on CPAP, cirrhosis, thrombocytopenia. Admitted with productive cough and hemoptysis; suspicious for pneumonia; order for PT Hydro to follow for sacral ulcer.  Precautions  Precautions Fall  Precaution Comments sacral ulcer  Pain Assessment  Pain Assessment Faces  Faces Pain Scale 6  Pain Location sacrum  Pain Descriptors / Indicators Discomfort;Grimacing;Sore  Pain Intervention(s) Monitored during session  Cognition  Arousal/Alertness Awake/alert  Behavior During Therapy WFL for tasks assessed/performed  Overall Cognitive Status Within Functional Limits for tasks assessed  Bed Mobility  General bed mobility comments Pt sitting up in chair   Balance  Sitting balance-Leahy Scale Good  Transfers  General transfer comment attempted sit to stand, but pt unable to lift buttocks with max A.  Attempted again using stedy, but pt unable to lift buttocks due to fatigue.  Assisted pt with repositioning in recliner with mod A.  Pt deferred back to bed until after dinner - spoke with RN re: likely need to use Maxi move   OT - End of Session  Equipment Utilized During Treatment Rolling walker  Activity Tolerance Patient limited by fatigue  Patient left in chair;with call bell/phone within reach  Nurse Communication Mobility  status;Need for lift equipment  OT Assessment/Plan  OT Plan Discharge plan remains appropriate  OT Visit Diagnosis Unsteadiness on feet (R26.81);Other abnormalities of gait and mobility (R26.89);Muscle weakness (generalized) (M62.81);Pain  Pain - part of body (sacrum )  OT Frequency (ACUTE ONLY) Min 2X/week  Follow Up Recommendations SNF  AM-PAC OT "6 Clicks" Daily Activity Outcome Measure  Help from another person eating meals? 4  Help from another person taking care of personal grooming? 3  Help from another person toileting, which includes using toliet, bedpan, or urinal? 2  Help from another person bathing (including washing, rinsing, drying)? 2  Help from another person to put on and taking off regular upper body clothing? 3  Help from another person to put on and taking off regular lower body clothing? 2  6 Click Score 16  ADL G Code Conversion CK  OT Goal Progression  Progress towards OT goals Not progressing toward goals - comment (fatigue )  OT Time Calculation  OT Start Time (ACUTE ONLY) 1607  OT Stop Time (ACUTE ONLY) 1622  OT Time Calculation (min) 15 min  OT General Charges  $OT Visit 1 Visit  OT Treatments  $Therapeutic Activity 8-22 mins  Sheneka Schrom, OTR/L [phone]

## 2017-09-13 NOTE — Evaluation (Signed)
Modified Barium Swallow Progress Note  Patient Details  Name: SPIKE DESILETS MRN: [id number] Date of Birth: 15-Nov-1956  Today's Date: 09/13/2017  Modified Barium Swallow completed.  Full report located under Chart Review in the Imaging Section.  Brief recommendations include the following:  Clinical Impression  Pt presents with moderate-severe pharyngeal dysphagia characterized by reduced pharyngeal contraction, decreased epiglottic deflection and reduced hyolaryngeal excursion resulting in poor airway protection and vallecular residue. Silent aspiration and penetration noted with thin liquid; supraglottic swallow was unsuccessful at protecting airway during the swallow. Cued pt for effective throat clear with success. Pooling in the pyriform sinus post swallow 2/2 reduced pharyngeal contraction as well as spillage from vallecular residue; effortful swallow successful at reducing residue. Improvement in airway protection and reduction of residue noted using chin tuck with nectar-thick liquid. Recommend Dys 3 (mech soft) diet with nectar-thick liquid and total assist for cueing compensatory strategies (chin tuck and effortful swallow with throat clear); meds crushed in puree. Frazier water protocol to be considered for pt comfort and to reduce the risk of dehydration. High risk of aspiration remains across all consistencies. Will continue to monitor for use of compensatory strategies, diet tolerance and begin RMST.    Swallow Evaluation Recommendations       SLP Diet Recommendations: Dysphagia 3 (Mech soft) solids;Nectar thick liquid   Liquid Administration via: Cup;Straw   Medication Administration: Crushed with puree   Supervision: Full supervision/cueing for compensatory strategies (Chin tuck, effortful swallow, throat clear)   Compensations: Slow rate;Small sips/bites;Effortful swallow;Chin tuck;Clear throat after each swallow   Postural Changes: Seated upright at 90 degrees   Oral  Care Recommendations: Oral care BID        Aaron Edelman, Student SLP 09/13/2017,11:39 AM

## 2017-09-13 NOTE — Progress Notes (Signed)
[facility] Progress Note   Subjective: Seen in room, lying in bed. With nasal oxygen. Denies CP or dyspnea. No new symptoms. Continues to undergo hydrotherapy for severe decubitus wound.    Objective Vitals:   09/12/17 1225 09/12/17 1705 09/12/17 2004 09/13/17 0446  BP: (!) 111/53 (!) 94/52 (!) 90/48 (!) 94/57  Pulse: 75 74 74 77  Resp: 16 16 14 16   Temp: (!) 97.5 F (36.4 C) 98.3 F (36.8 C) 97.9 F (36.6 C) 97.8 F (36.6 C)  TempSrc: Oral Oral    SpO2: 90% 93% 100% 100%  Weight: 72.6 kg (160 lb)     Height:       Physical Exam General: Frail appearing male, NAD. On nasal oxygen. Heart: RRR; no murmur. Lungs: CTAB Abdomen: soft, ostomy LLQ. Extremities: 2+ edema in hips/thighs. Necrotic spot on R great toe. Dialysis Access: RUE AVF + thrill/bruit  Additional Objective Labs: Basic Metabolic Panel:  Recent Labs Lab 09/07/17 1330 09/09/17 0654 09/10/17 0325 09/12/17 0823  NA 127* 125* 128* 126*  K 3.3* 3.9 3.8 4.5  CL 92* 90* 91* 90*  CO2 25 24 24 22   GLUCOSE 167* 115* 160* 136*  BUN 35* 39* 28* 64*  CREATININE 3.27* 3.60* 2.57* 3.74*  CALCIUM 8.2* 8.5* 8.3* 8.7*  PHOS 2.6 3.1  --  4.1   Liver Function Tests:  Recent Labs Lab 09/09/17 0654 09/10/17 0325 09/12/17 0823  AST  --  80*  --   ALT  --  30  --   ALKPHOS  --  173*  --   BILITOT  --  2.6*  --   PROT  --  6.8  --   ALBUMIN 2.1* 2.0* 1.8*   CBC:  Recent Labs Lab 09/09/17 0344 09/10/17 0325 09/11/17 0242 09/12/17 0313 09/13/17 0502  WBC 6.7 6.8 8.5 8.2 8.1  HGB 11.1* 10.9* 11.3* 11.3* 11.0*  HCT 35.1* 34.1* 34.9* 34.4* 34.1*  MCV 99.4 100.3* 99.1 100.0 100.0  PLT 78* 88* 94* 94* 83*   CBG:  Recent Labs Lab 09/12/17 0748 09/12/17 1455 09/12/17 1708 09/12/17 2133 09/13/17 0746  GLUCAP 135* 110* 139* 132* 157*   Medications: . sodium chloride     . colchicine  0.3 mg Oral Once per day on Mon Thu  . collagenase   Topical Daily  . darbepoetin (ARANESP)  injection - DIALYSIS  60 mcg Intravenous Q Wed-HD  . feeding supplement (NEPRO CARB STEADY)  237 mL Oral BID BM  . feeding supplement (PRO-STAT SUGAR FREE 64)  30 mL Oral BID WC  . guaiFENesin  1,200 mg Oral BID  . insulin aspart  0-5 Units Subcutaneous QHS  . insulin aspart  0-9 Units Subcutaneous TID WC  . loratadine  10 mg Oral Daily  . midodrine  10 mg Oral TID WC  . multivitamin  1 tablet Oral QHS  . ondansetron (ZOFRAN) IV  4 mg Intravenous Q6H  . pantoprazole  40 mg Oral Daily  . sodium chloride flush  3 mL Intravenous Q12H  . Warfarin - Pharmacist Dosing Inpatient   Does not apply q1800    Dialysis Orders: [place]/ [facility] MWF 4h 3K/2.5 77kg Hep none R AVF - Mircera 50 ug q 2, to start 10/3 - Venofer 50/wk  Assessment/Plan: 1. HCAP: Per CT, BCx negative. Completed course of Vanc/Cefepime. S/p 1.8L thora 9/30; transudative effusion. Recurrent pleural effusion on CXR 09/08/17. 2. ESRD: Continue HD per MWF schedule, next 10/10.  3.Hypotension/volume. Will need lower EDW on  d/c. Still has pitting edema abdomen and thigh. Continue attempting to lower volume as BP permits. Chronic hypotension on midodrine.  4. Anemia: Hgb 11.Continue Aranesp 22mcg weekly (last 10/3). 5. Secondary hyperparathyroidism:Corr Ca and Phos ok. Binders on hold. 6. Nutrition: Severe PCM. Albumin 1.8. Continue Nepro/Pro-stat supplements. 7. CHF/CM: EF 15-20%, Hx AICD 8. Afib: On warfarin 9. DM: On insulin. 10. Sacral decub: Unstageable. Getting hydrotherapy. Followed by wound care. 11. Cirrhosis 12. Hx Rectal Ca: s/p diverting colostomy  13. Dispo: Will be discharged to [facility] soon. Requesting transfer of dialysis centers (to [facility] This was approved and will be starting at [facility] on discharge.   Veneta Penton, PA-C 09/13/2017, 9:13 AM  [employer] Pager: [phone]

## 2017-09-13 NOTE — Consult Note (Addendum)
[place] Nurse wound follow up Wound type: Stage 4 Pressure injury sacrum, Unstageable Pressure injury right heel, arterial ulceration right great toe, trauma/arterial ulceration right pretibial region  Measurement:  Sacrum: 7cm x 5cm x 0.5cm with undermining from 12-2 o'clock  that is 2.5cm; 90% red/10% yellow fibrin over coccyx bone Right great toe: 0.5cm x 1.5cm x 0cm; 100% stable eschar Right pretibial: 2cm x 1cm x 0cm; 100% stable scabbing Right heel: 2cm x 2cm x 0cm; 100% stable eschar Wound bed: see above Drainage (amount, consistency, odor) moderate, serosanguinous from sacrum, no other sites with drainage.  Periwound: intact Dressing procedure/placement/frequency: Continue betadine to the toe tip, heel and pretibial area Started NPWT to the sacral ulceration, 1pc of black foam used the fill the wound bed, skin to the right hip protected with VAC drape and 1pc of black foam used for bridge to keep patient from lying on theTRAC pad or tubing. Sealed at 165mmHG, patient tolerated, however he received IV pain meds during the dressing change.   Will need HHRN or SNF that can change NPWT dressings 3x wk, currently T/Th/Sat.   [place] Nurse ostomy follow up Stoma type/location: LLQ, end colostomy Present x 10 years Stomal assessment/size: did not measure Peristomal assessment: pouch intact  Treatment options for stomal/peristomal skin: NA Output minimal per patient, he uses closed end pouches at home and currently has one in place Ostomy pouching: 2pc. Closed end    [place] nurse team will follow along for NPWT dressing follow up with bridging.  [place], [place], [place]

## 2017-09-13 NOTE — Progress Notes (Signed)
Nutrition Follow-up  DOCUMENTATION CODES:   Non-severe (moderate) malnutrition in context of chronic illness  INTERVENTION:   -Continue Nepro Shake po BID, each supplement provides 425 kcal and 19 grams protein (Nectar like consistency)  -Increase Pro-Stat 30 mL from BID to TID  -Add Magic cup TID with meals, each supplement provides 290 kcal and 9 grams of protein   NUTRITION DIAGNOSIS:   Malnutrition (moderate) related to chronic illness (ESRD on HD) as evidenced by moderate depletion of body fat, moderate depletions of muscle mass.  Being addressed via ONS, protein modular  GOAL:   Patient will meet greater than or equal to 90% of their needs  Progressing  MONITOR:   PO intake, Supplement acceptance, Labs, Weight trends, Skin, I & O's  REASON FOR ASSESSMENT:   Consult Wound healing  ASSESSMENT:   61 yo Male with PMH of SAH, Pafib, Mild MR, Mild AR, CAD, CHF (EF 20%) ESRD on HD (M,W, F), apparently c/o cough with yellow sputum and mild hemoptysis. He was found to have health care associated pneumonia. Admitted for the management of hcap.   Undergoing hydrotherapy for wound Pt continues with pitting edema in abdomen and thigh, plan to lower EDW Decline in cognition requiring downgrading of diet by SLP, to honey thick liquids on 10/7, upgraded to nectar thick today  Pt reports appetite is good, recorded po intake 10-50% of meals. Pt loves the Pro-Stat supplement, can't recall if he likes the Nepro or not.   Labs: sodium 126, Creatinine 3.74, BUN 64, albumin 1.8 Meds: Rena-Vit  Diet Order:  DIET DYS 3 Room service appropriate? Yes; Fluid consistency: Nectar Thick  Skin:  Wound (see comment) (stage IV sacrum, unstageable heel, venous stasis ulcers)  Last BM:  10/7 colostomy  Height:   Ht Readings from Last 1 Encounters:  09/03/17 5\' 10"  (1.778 m)    Weight:   Wt Readings from Last 1 Encounters:  09/12/17 160 lb (72.6 kg)    Ideal Body Weight:  75.4  kg  BMI:  Body mass index is 22.96 kg/m.  Estimated Nutritional Needs:   Kcal:  2100-2300  Protein:  105-120 gm  Fluid:  1200 ml/day  EDUCATION NEEDS:   No education needs identified at this time  [place], [place], LDN [phone] Pager  [phone] Weekend/On-Call Pager

## 2017-09-13 NOTE — Progress Notes (Signed)
ANTICOAGULATION CONSULT NOTE - Follow Up Consult  Pharmacy Consult for Coumadin Indication: atrial fibrillation  No Known Allergies  Patient Measurements: Height: 5\' 10"  (177.8 cm) Weight: 160 lb (72.6 kg) IBW/kg (Calculated) : 73  Vital Signs: Temp: 97.8 F (36.6 C) (10/09 0446) BP: 94/57 (10/09 0446) Pulse Rate: 77 (10/09 0446)  Labs:  Recent Labs  09/11/17 0242 09/12/17 0313 09/12/17 0823 09/13/17 0502  HGB 11.3* 11.3*  --  11.0*  HCT 34.9* 34.4*  --  34.1*  PLT 94* 94*  --  83*  LABPROT 35.2* 40.3*  --  31.8*  INR 3.54 4.21*  --  3.11  CREATININE  --   --  3.74*  --     Estimated Creatinine Clearance: 21 mL/min (A) (by C-G formula based on SCr of 3.74 mg/dL (H)).  Assessment:  Continues on Coumadin for atrial fibrillation.   INR therapeutic (2.37) on admit 09/03/17, but trended up to supratherapeutic range on 1 mg daily (9/29>>10/6).  Coumadin held on 10/7 when INR 3.54, and again on 10/8 when INR up to 4.21 today. Down to 3.11 today. No bleeding reported.  Hgb stable, platelet count a little lower at 83K.  Noted to have chronic thrombocytopenia with platelets down to 66K on 10/4, but back up to 94 on 10/7 and again 10/8. As previously discussed with Dr. Lonny Prude, we will watch platelet count and consider discussing warfarin interruption if platelet count falls <50K.  Last total bilirubin 2.6 on 10/6. On dysphagia 3 diet; little PO intake.    Home Coumadin regimen: 1 mg daily except 2 mg on Fridays.  Goal of Therapy:  INR 2-3 Monitor platelets by anticoagulation protocol: Yes   Plan:   No Coumadin again today.  Discussed with patient and wife.  Daily PT/INR.  Arty Baumgartner, [place] Pager: [phone] 09/13/2017,12:54 PM

## 2017-09-13 NOTE — Discharge Instructions (Signed)

## 2017-09-14 LAB — GLUCOSE, CAPILLARY
GLUCOSE-CAPILLARY: 137 mg/dL — AB (ref 65–99)
Glucose-Capillary: 161 mg/dL — ABNORMAL HIGH (ref 65–99)
Glucose-Capillary: 112 mg/dL — ABNORMAL HIGH (ref 65–99)

## 2017-09-14 LAB — COMPREHENSIVE METABOLIC PANEL
ALBUMIN: 1.9 g/dL — AB (ref 3.5–5.0)
ALT: 38 U/L (ref 17–63)
ANION GAP: 14 (ref 5–15)
AST: 103 U/L — ABNORMAL HIGH (ref 15–41)
Alkaline Phosphatase: 195 U/L — ABNORMAL HIGH (ref 38–126)
BUN: 48 mg/dL — ABNORMAL HIGH (ref 6–20)
CHLORIDE: 93 mmol/L — AB (ref 101–111)
CO2: 22 mmol/L (ref 22–32)
Calcium: 8.5 mg/dL — ABNORMAL LOW (ref 8.9–10.3)
Creatinine, Ser: 3.86 mg/dL — ABNORMAL HIGH (ref 0.61–1.24)
GFR calc Af Amer: 18 mL/min — ABNORMAL LOW (ref >60)
GFR calc non Af Amer: 15 mL/min — ABNORMAL LOW (ref >60)
GLUCOSE: 163 mg/dL — AB (ref 65–99)
POTASSIUM: 4.3 mmol/L (ref 3.5–5.1)
SODIUM: 129 mmol/L — AB (ref 135–145)
TOTAL PROTEIN: 6.9 g/dL (ref 6.5–8.1)
Total Bilirubin: 3.1 mg/dL — ABNORMAL HIGH (ref 0.3–1.2)

## 2017-09-14 LAB — CBC WITH DIFFERENTIAL/PLATELET
BASOS ABS: 0 10*3/uL (ref 0.0–0.1)
Basophils Relative: 0 %
EOS ABS: 0.1 10*3/uL (ref 0.0–0.7)
Eosinophils Relative: 1 %
HCT: 33.3 % — ABNORMAL LOW (ref 39.0–52.0)
Hemoglobin: 11 g/dL — ABNORMAL LOW (ref 13.0–17.0)
LYMPHS ABS: 0.8 10*3/uL (ref 0.7–4.0)
Lymphocytes Relative: 9 %
MCH: 33.3 pg (ref 26.0–34.0)
MCHC: 33 g/dL (ref 30.0–36.0)
MCV: 100.9 fL — ABNORMAL HIGH (ref 78.0–100.0)
Monocytes Absolute: 0.5 10*3/uL (ref 0.1–1.0)
Monocytes Relative: 6 %
NEUTROS ABS: 7.1 10*3/uL (ref 1.7–7.7)
Neutrophils Relative %: 84 %
PLATELETS: 70 10*3/uL — AB (ref 150–400)
RBC: 3.3 MIL/uL — ABNORMAL LOW (ref 4.22–5.81)
RDW: 22.3 % — AB (ref 11.5–15.5)
WBC: 8.5 10*3/uL (ref 4.0–10.5)

## 2017-09-14 LAB — PHOSPHORUS: Phosphorus: 4.6 mg/dL (ref 2.5–4.6)

## 2017-09-14 LAB — PROTIME-INR
INR: 3.19
PROTHROMBIN TIME: 32.4 s — AB (ref 11.4–15.2)

## 2017-09-14 LAB — MAGNESIUM: Magnesium: 2.1 mg/dL (ref 1.7–2.4)

## 2017-09-14 IMAGING — CT CT ABD-PELV W/O CM
2 of 4 series · 14 of 46 positions shown, 16 images · non-contrast
Comparison: Multiple priors, most recently CT of the abdomen and
pelvis 07/31/2015.

CLINICAL DATA: 59-year-old male with history of rectal cancer
diagnosed in 5000 status post surgical resection, chemotherapy and
radiation therapy now complete. Follow-up for right perirectal soft
tissue density.

EXAM:
CT ABDOMEN AND PELVIS WITHOUT CONTRAST
TECHNIQUE: Multidetector CT imaging of the abdomen and pelvis was performed
following the standard protocol without IV contrast.

[Series 2: rtn a/p w/o · axial · non-contrast · 0.87mm/px · z∈[-500,-54]mm · 11 of 101 slices shown, 13 images]
[im 6/101  soft-tissue]
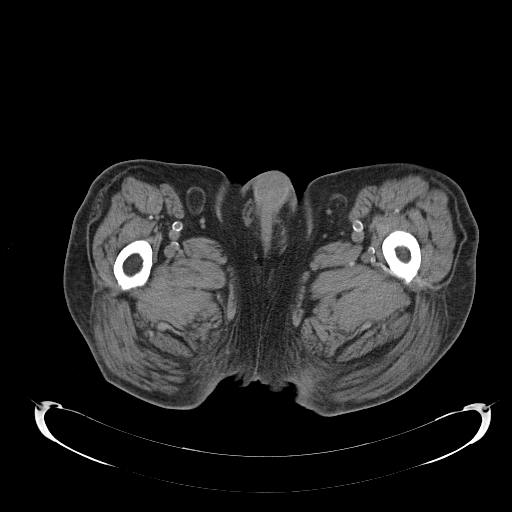
[im 6/101  bone]
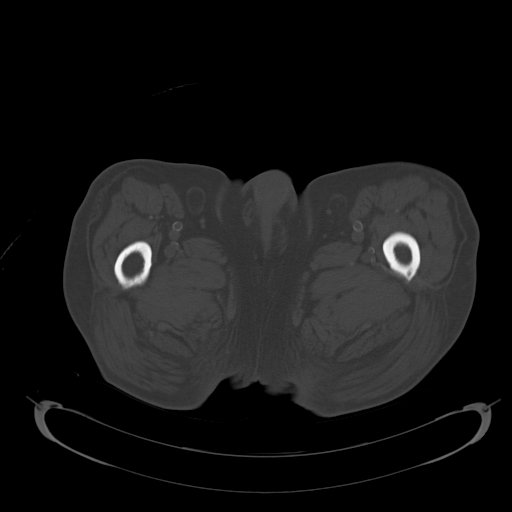
[im 17/101  soft-tissue]
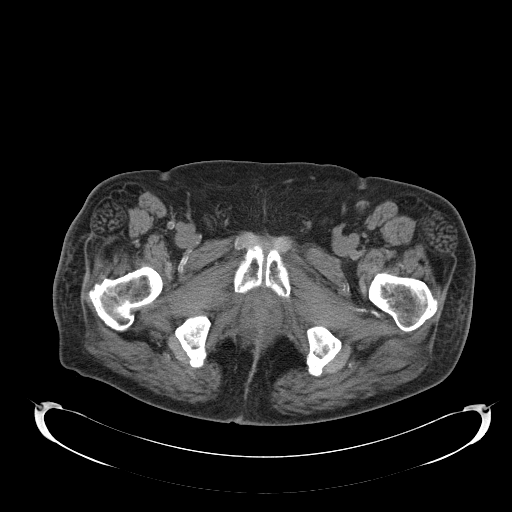
[im 23/101  soft-tissue]
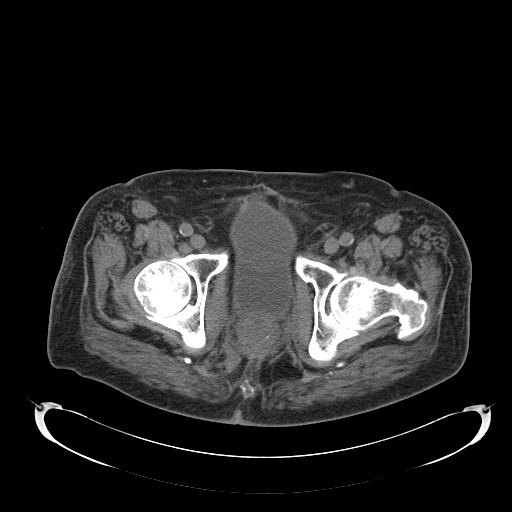
[im 34/101  soft-tissue]
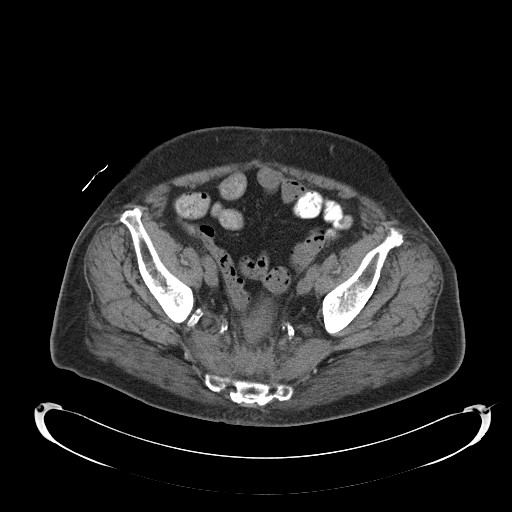
[im 39/101  soft-tissue]
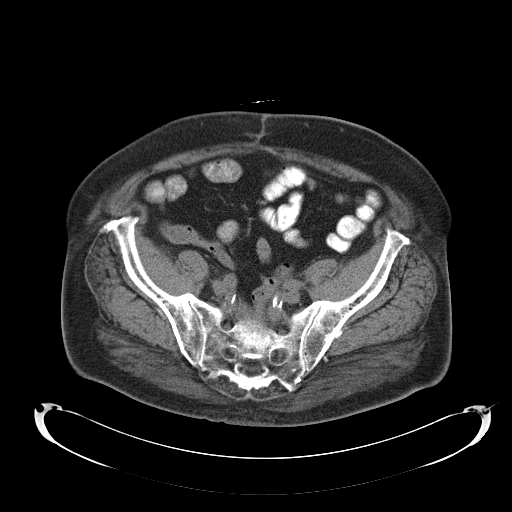
[im 51/101  soft-tissue]
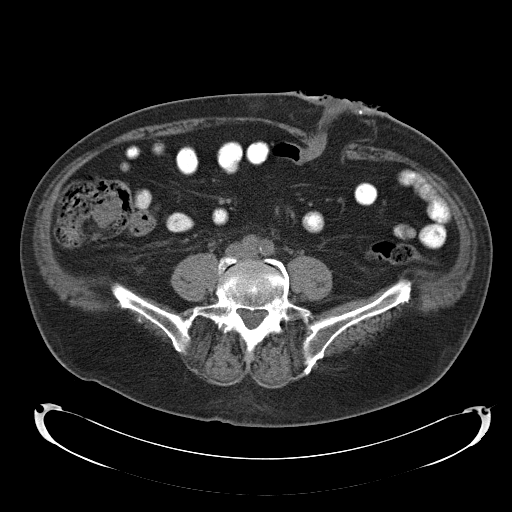
[im 62/101  soft-tissue]
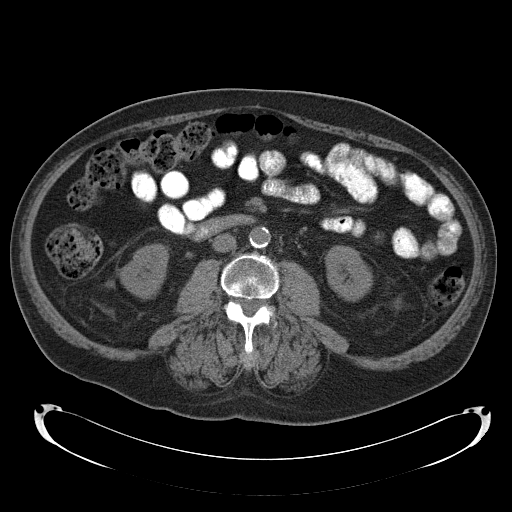
[im 67/101  soft-tissue]
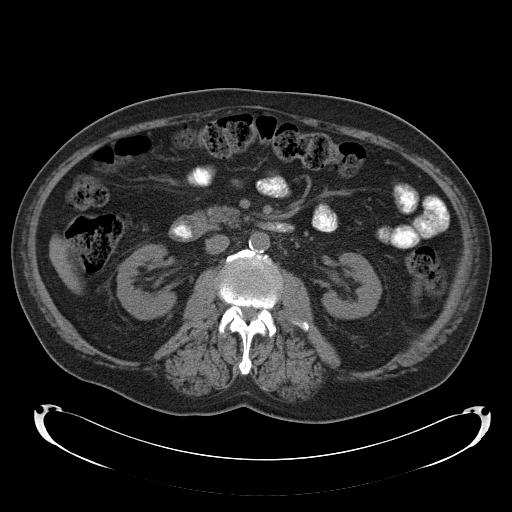
[im 78/101  soft-tissue]
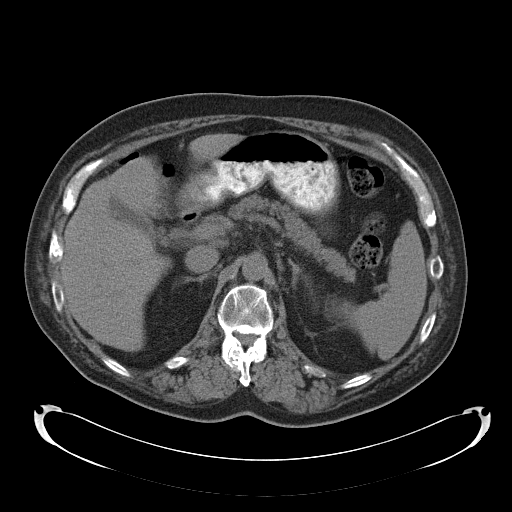
[im 78/101  bone]
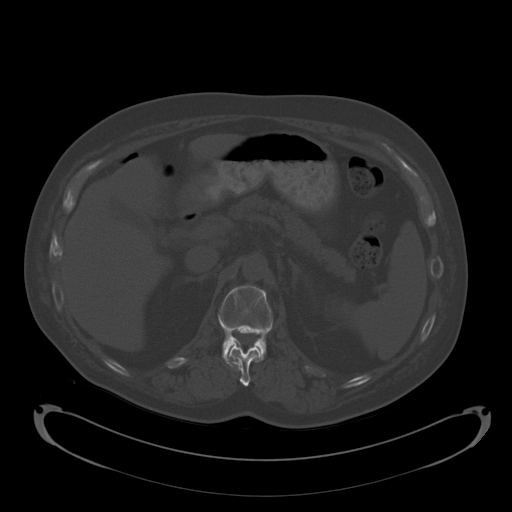
[im 84/101  soft-tissue]
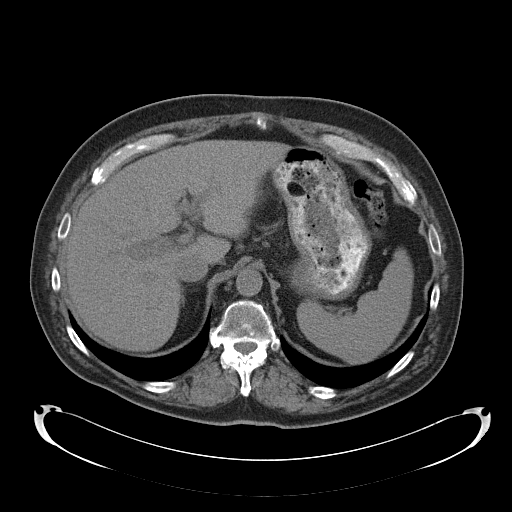
[im 95/101  soft-tissue]
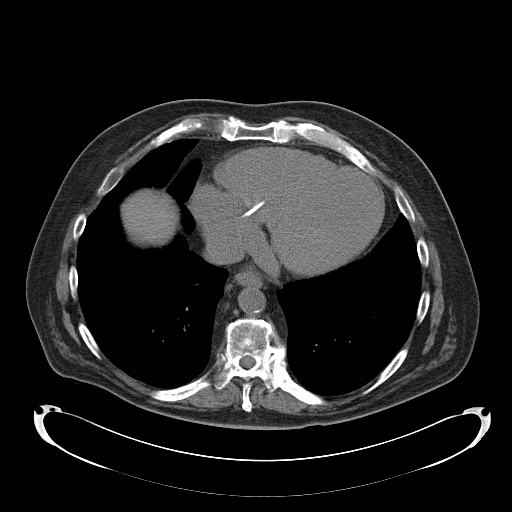

[Series 602: <mpr thick range> · coronal · 0.98mm/px · 3 of 150 slices shown]
[im 50/150  soft-tissue]
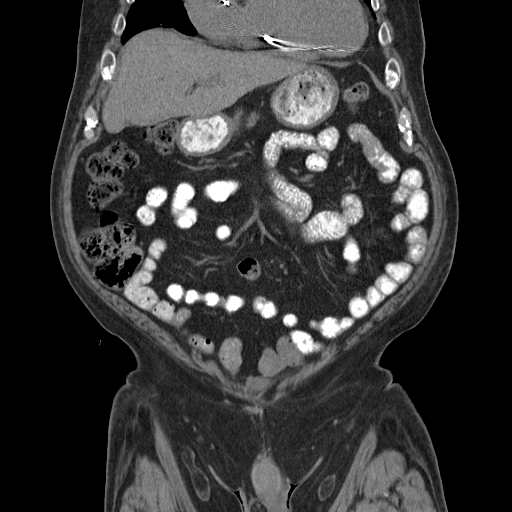
[im 67/150  soft-tissue]
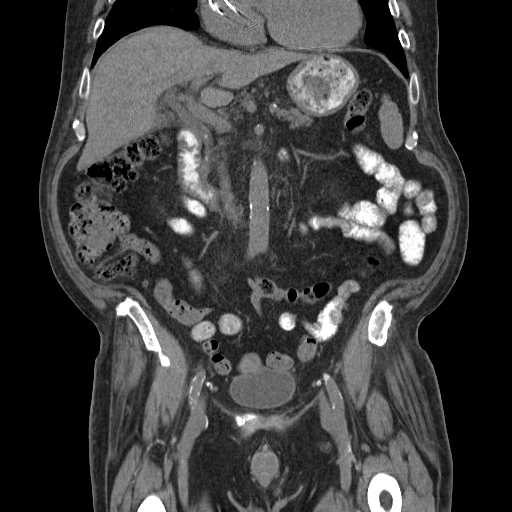
[im 83/150  soft-tissue]
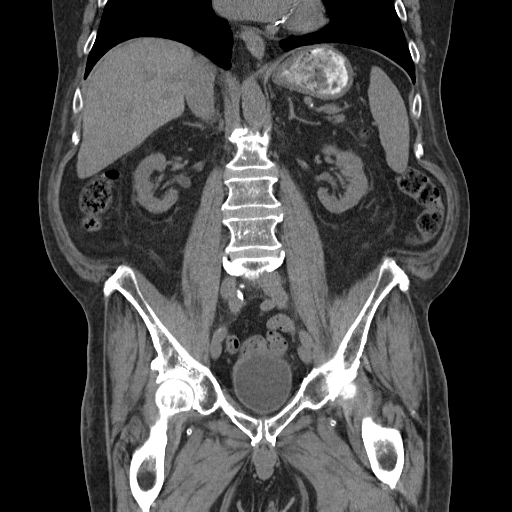

[14 of 46 positions shown; findings below may reference images not displayed]

FINDINGS: Lower chest: Cardiomegaly. Atherosclerotic calcifications in the
left anterior descending, left circumflex and right coronary
arteries. Curvilinear calcification associated with the left
ventricular apical myocardium which appears rounded, compatible with
a chronic left ventricular apical aneurysm. Pacemaker leads in the
right atrium, terminating in the right ventricular apex, and
extending into the coronary veins via the coronary sinus.

Hepatobiliary: Liver has a shrunken appearance and slightly nodular
contour, suggestive of underlying cirrhosis. No definite cystic or
solid hepatic lesion is confidently identified on today's
noncontrast CT examination. Unenhanced appearance of the gallbladder
is normal.

Pancreas: No pancreatic mass or peripancreatic inflammatory changes
are identified on today's noncontrast CT examination.

Spleen: Unremarkable.

Adrenals/Urinary Tract: Several tiny calcifications are noted within
the collecting systems of the kidneys bilaterally, measuring up to 4
mm in the interpolar collecting system of the left kidney, likely to
represent nonobstructive calculi) alternatively, some may represent
vascular calcifications). No calcifications are identified along the
course of either ureter or within the lumen of the urinary bladder.
No hydroureteronephrosis to indicate urinary tract obstruction at
this time. Bilateral perinephric stranding is noted (nonspecific).
Unenhanced appearance of the kidneys is otherwise unremarkable.
Unenhanced appearance of the adrenal glands is normal. Unenhanced
appearance of the urinary bladder is unremarkable.

Stomach/Bowel: Unenhanced appearance of the stomach is normal. There
is no pathologic dilatation of small bowel or colon. Normal
appendix. Status post resection of the rectum with left lower
quadrant colostomy. There continues to be extensive thickening of
the presacral soft tissues which is somewhat mass-like in
appearance, but appears essentially unchanged compared to prior
study from 07/31/2015, presumably an area of benign postoperative
scarring. This measures up to 3.0 x 5.3 cm (axial image 73 of series
2). Likewise, there is a small amount of architectural distortion in
the right side of the ischiorectal fossa where there is again some
nodular soft tissue prominence in an area that is typically occupied
by a nothing but ischiorectal fat. This is best demonstrated on
image 78 of series 2 where this area of soft tissue prominence
measures approximately 1.9 x 2.5 cm, slightly less apparent than the
prior study from 07/31/2015, presumably an area of postoperative
scarring.

Vascular/Lymphatic: Atherosclerosis throughout the abdominal and
pelvic vasculature, without evidence of aneurysm. No lymphadenopathy
noted in the abdomen or pelvis on today's noncontrast CT
examination.

Reproductive: Fiducial markers in or immediately posterior to the
prostate gland. Prostate gland is otherwise unremarkable in
appearance. Seminal vesicles are not well visualized, likely
distorted by postoperative and post treatment related scarring.

Other: No significant volume of ascites.  No pneumoperitoneum.

Musculoskeletal: There are no aggressive appearing lytic or blastic
lesions noted in the visualized portions of the skeleton.
IMPRESSION: 1. Stable post treatment related changes from prior surgery and
radiation therapy in the low anatomic pelvis, as discussed above. No
definite findings to suggest local recurrence of disease or
metastatic disease in the abdomen or pelvis on today's noncontrast
CT examination.
2. Morphologic changes in the liver suggestive of underlying
cirrhosis.
3. Multiple tiny nonobstructive calculi in the collecting systems of
the kidneys bilaterally.
4. Extensive atherosclerosis, including at least 3 vessel coronary
artery disease. Additionally, there is evidence of chronic left
ventricular apical aneurysm indicative of prior LAD territory
myocardial infarction, as above.
5. Additional incidental findings, as above.

## 2017-09-14 MED ORDER — MIDODRINE HCL 5 MG PO TABS
ORAL_TABLET | ORAL | Status: AC
  Administered 2017-09-14: 10 mg via ORAL
  Filled 2017-09-14: qty 2

## 2017-09-14 MED ORDER — DARBEPOETIN ALFA 60 MCG/0.3ML IJ SOSY: 60.0000 ug | PREFILLED_SYRINGE | INTRAMUSCULAR | Status: DC

## 2017-09-14 MED ORDER — HYDROCODONE-ACETAMINOPHEN 5-325 MG PO TABS
ORAL_TABLET | ORAL | Status: AC
  Administered 2017-09-14: 1
  Filled 2017-09-14: qty 2

## 2017-09-14 MED ORDER — ONDANSETRON HCL 4 MG/2ML IJ SOLN: 4.0000 mg | Freq: Four times a day (QID) | INTRAMUSCULAR | Status: DC | PRN

## 2017-09-14 MED ORDER — DARBEPOETIN ALFA 60 MCG/0.3ML IJ SOSY
PREFILLED_SYRINGE | INTRAMUSCULAR | Status: AC
  Administered 2017-09-14: 60 ug via INTRAVENOUS
  Filled 2017-09-14: qty 0.3

## 2017-09-14 NOTE — Clinical Social Work Note (Signed)
Clinical Social Work Assessment  Patient Details  Name: [name] MRN: [id number] Date of Birth: 1956/10/20  Date of referral:  09/12/17               Reason for consult:  Facility Placement                Permission sought to share information with:  Chartered certified accountant granted to share information::  Yes, Verbal Permission Granted  Name::     [name]  Agency::  SNf  Relationship::  wife  Contact Information:     Housing/Transportation Living arrangements for the past 2 months:  Single Family Home Source of Information:  Patient, Spouse Patient Interpreter Needed:  None Criminal Activity/Legal Involvement Pertinent to Current Situation/Hospitalization:    Significant Relationships:  Spouse Lives with:  Spouse, Other (Comment) Do you feel safe going back to the place where you live?  No Need for family participation in patient care:  Yes (Comment) (wife has been assisting with care)  Care giving concerns:  Pt lives at home with spouse but has been having chronic medical concerns and is even weaker than normal.   Facilities manager / plan:  CSW spoke with pt and pt wife concerning PT recommendation for SNF.  Pt and wife were originally against placement but acknowledge pt decline and need for increased level of care given his sacral wound and new weakness.  Employment status:  Disabled (Comment on whether or not currently receiving Disability) Insurance information:  Medicare PT Recommendations:  [place] / Referral to community resources:  [place]  Patient/Family's Response to care:  Pt and pt wife agreeable to SNF at this time- hopeful it will be short term  Patient/Family's Understanding of and Emotional Response to Diagnosis, Current Treatment, and Prognosis:  Pt and wife are optimistic about pt recovery and hopeful that SNF will help him achieve his goals.  Emotional Assessment Appearance:   Appears stated age Attitude/Demeanor/Rapport:    Affect (typically observed):  Quiet Orientation:  Oriented to Self, Oriented to Place, Oriented to  Time, Oriented to Situation Alcohol / Substance use:  Not Applicable Psych involvement (Current and /or in the community):  No (Comment)  Discharge Needs  Concerns to be addressed:  Care Coordination Readmission within the last 30 days:  No Current discharge risk:  Physical Impairment, Chronically ill Barriers to Discharge:  Continued Medical Work up   Jorge Ny, LCSW 09/14/2017, 1:42 PM

## 2017-09-14 NOTE — Progress Notes (Signed)
[facility] Progress Note   Subjective:  Seen in room, will be coming for 2nd shift HD today. No CP or dyspnea. Had sacral wound vac placed yesterday, getting used to it. Denies pain at this time.  Objective Vitals:   09/13/17 1650 09/13/17 1804 09/13/17 2044 09/14/17 0448  BP: (!) 110/57 (!) 112/56 (!) 100/59 107/64  Pulse: 78 80 72 75  Resp: 16 16 15 15   Temp: 97.9 F (36.6 C) 97.9 F (36.6 C) (!) 97.4 F (36.3 C) 97.6 F (36.4 C)  TempSrc: Oral Oral    SpO2: 100% 100% 100% 97%  Weight:    70.3 kg (155 lb)  Height:       Physical Exam General: Frail appearing male, NAD. On nasal oxygen. Heart: RRR; no murmur. Lungs: CTAB Abdomen: soft, ostomy LLQ. Extremities: 2+ edema in hips/thighs. Necrotic spot on R great toe. Dialysis Access: RUE AVF + thrill/bruit  Additional Objective Labs: Basic Metabolic Panel:  Recent Labs Lab 09/07/17 1330 09/09/17 0654 09/10/17 0325 09/12/17 0823  NA 127* 125* 128* 126*  K 3.3* 3.9 3.8 4.5  CL 92* 90* 91* 90*  CO2 25 24 24 22   GLUCOSE 167* 115* 160* 136*  BUN 35* 39* 28* 64*  CREATININE 3.27* 3.60* 2.57* 3.74*  CALCIUM 8.2* 8.5* 8.3* 8.7*  PHOS 2.6 3.1  --  4.1   Liver Function Tests:  Recent Labs Lab 09/09/17 0654 09/10/17 0325 09/12/17 0823  AST  --  80*  --   ALT  --  30  --   ALKPHOS  --  173*  --   BILITOT  --  2.6*  --   PROT  --  6.8  --   ALBUMIN 2.1* 2.0* 1.8*   CBC:  Recent Labs Lab 09/09/17 0344 09/10/17 0325 09/11/17 0242 09/12/17 0313 09/13/17 0502  WBC 6.7 6.8 8.5 8.2 8.1  HGB 11.1* 10.9* 11.3* 11.3* 11.0*  HCT 35.1* 34.1* 34.9* 34.4* 34.1*  MCV 99.4 100.3* 99.1 100.0 100.0  PLT 78* 88* 94* 94* 83*   CBG:  Recent Labs Lab 09/13/17 0746 09/13/17 1216 09/13/17 1641 09/13/17 2042 09/14/17 0732  GLUCAP 157* 157* 149* 137* 137*   Studies/Results: Dg Swallowing Func-speech Pathology  Result Date: 09/13/2017 Completed by Aaron Edelman, SLP Student Supervised and reviewed  by Herbie Baltimore MA CCC-SLP Objective Swallowing Evaluation: Type of Study: MBS-Modified Barium Swallow Study Patient Details Name: [name] MRN: [id number] Date of Birth: Apr 08, 1956 Today's Date: 09/13/2017 Time: SLP Start Time (ACUTE ONLY): 1009-SLP Stop Time (ACUTE ONLY): 1039 SLP Time Calculation (min) (ACUTE ONLY): 30 min Past Medical History: Past Medical History: Diagnosis Date . Adenocarcinoma of rectum ([place])   a. 2008-colostomy . AICD (automatic cardioverter/defibrillator) present 2002  BI V ICD . Anemia  . CAD (coronary artery disease)   a. BMS to LAD 2001 at [facility] b. PTCA/atherectomy ramus and BMS to LAD 2009 . CHF (congestive heart failure) ([place])  . Cholelithiasis 06/2015 . Chronic kidney disease, stage IV (severe) ([place])  . Chronic systolic heart failure ([place])  . Cirrhosis ([place])  . Colostomy in place [facility])  . Dizziness   a. chronic. Admission for this 07/18/2014 . DM type 2, uncontrolled, with renal complications ([place])  . Dysrhythmia  . Essential hypertension, benign  . GERD (gastroesophageal reflux disease)  . HCAP (healthcare-associated pneumonia) 07/21/2015 . Hematuria  . History of blood transfusion   "I've had 2 units so far this year" (09/27/2015) . HLD (hyperlipidemia)  . Ischemic cardiomyopathy  EF 18% by nuclear study 2016, multiple myocardial infarctions in past   . Myocardial infarction ([place]) 2001 . Obesity  . Orthostatic hypotension  . OSA on CPAP  . Paroxysmal atrial fibrillation ([facility])   a. on amiodarone, digoxin and Eliquis . PONV (postoperative nausea and vomiting)  . Presence of permanent cardiac pacemaker  . Prostate cancer ([place])   a. s/p seed implants with chemo and radiation . SAH (subarachnoid hemorrhage) ([place])   post-traumatic (fall) [facility] 12/2014 . TIA (transient ischemic attack)  Past Surgical History: Past Surgical History: Procedure Laterality Date . Abdominal and Perineal Resection of Rectum with Total Mesorectal Excision  10/04/2007 . AV FISTULA PLACEMENT Right 09/16/2015   Procedure: ARTERIOVENOUS (AV) FISTULA CREATION - BRACHIOCEPHALIC;  Surgeon: Elam Dutch, MD;  Location: [place];  Service: Vascular;  Laterality: Right; . BI-VENTRICULAR IMPLANTABLE CARDIOVERTER DEFIBRILLATOR  (CRT-D)  2009 . CARDIAC CATHETERIZATION  08/2001; 2009  ; [name] 07/10/2013 . CARDIAC DEFIBRILLATOR PLACEMENT  2002 . CARDIAC DEFIBRILLATOR PLACEMENT  2009  Upgraded to a BiV ICD . COLONOSCOPY  09/14/2011  Dr. Gala Romney: via colostomy, Single pedunculated benign inflammatory polyp. Due for surveillance Oct 2015 . COLONOSCOPY N/A 07/02/2014  Procedure: COLONOSCOPY;  Surgeon: Daneil Dolin, MD;  [facility];  Service: Endoscopy;  Laterality: N/A;  7:30 / COLONOSCOPY THRU COLOSTOMY . COLONOSCOPY N/A 12/11/2014  Dr. Gala Romney via colostomy. Normal. Repeat in 2021.  Marland Kitchen COLONOSCOPY N/A 08/24/2015  Dr. Havery Moros: diminutive polyp not removed, stoma site of bleeding . COLOSTOMY  2008 . CORONARY ANGIOPLASTY WITH STENT PLACEMENT  2001; ~ 2006  "1 + 1"  . ELECTROPHYSIOLOGIC STUDY N/A 08/28/2015  Procedure: AV Node Ablation;  Surgeon: Will Meredith Leeds, MD;  Location: [place] CV LAB;  Service: Cardiovascular;  Laterality: N/A; . EP IMPLANTABLE DEVICE N/A 04/10/2015  Procedure: Ppm/Biv Ppm Generator Changeout;  Surgeon: Evans Lance, MD;  Location: [place] INVASIVE CV LAB CUPID;  Service: Cardiovascular;  Laterality: N/A; . ERCP N/A 06/11/2016  Procedure: ENDOSCOPIC RETROGRADE CHOLANGIOPANCREATOGRAPHY (ERCP);  Surgeon: Teena Irani, MD;  Location: [facility] ENDOSCOPY;  Service: Endoscopy;  Laterality: N/A; . ERCP N/A 01/26/2017  Procedure: ENDOSCOPIC RETROGRADE CHOLANGIOPANCREATOGRAPHY (ERCP);  Surgeon: Teena Irani, MD;  Location: [facility] ENDOSCOPY;  Service: Endoscopy;  Laterality: N/A; . ERCP  06/2016  Dr. Amedeo Plenty: duodenal fistula, dilated bile duct but no obvious stones, s/p sphincterotomy and stent placement . ERCP  01/2017  Dr. Amedeo Plenty: CBD stones, s/p sphincterotomy, balloon extraction, stent removal . ESOPHAGOGASTRODUODENOSCOPY N/A  07/02/2014  Procedure: ESOPHAGOGASTRODUODENOSCOPY (EGD);  Surgeon: Daneil Dolin, MD;  [facility];  Service: Endoscopy;  Laterality: N/A;  7:30 . ESOPHAGOGASTRODUODENOSCOPY N/A 12/11/2014  LOV:FIEPPI EGD . ESOPHAGOGASTRODUODENOSCOPY (EGD) WITH PROPOFOL N/A 04/18/2017  Procedure: ESOPHAGOGASTRODUODENOSCOPY (EGD) WITH PROPOFOL;  Surgeon: Daneil Dolin, MD;  [facility];  Service: Endoscopy;  Laterality: N/A;  2:30pm . GASTROINTESTINAL STENT REMOVAL N/A 01/26/2017  Procedure: GASTROINTESTINAL STENT REMOVAL;  Surgeon: Teena Irani, MD;  Location: [place];  Service: Endoscopy;  Laterality: N/A; . GIVENS CAPSULE STUDY N/A 07/23/2015  Dr. Michail Sermon: small non-bleeding AVM, mild gastritis . IR FLUORO GUIDE CV LINE RIGHT  07/27/2017 . IR REMOVAL TUN CV CATH W/O FL  08/12/2017 . IR US GUIDE VASC ACCESS RIGHT  07/27/2017 . LEFT HEART CATHETERIZATION WITH CORONARY ANGIOGRAM N/A 07/13/2013  Procedure: LEFT HEART CATHETERIZATION WITH CORONARY ANGIOGRAM;  Surgeon: Lorretta Harp, MD;  Location: [facility] CATH LAB;  Service: Cardiovascular;  Laterality: N/A; . MALONEY DILATION N/A 07/02/2014  Procedure: Venia Minks DILATION;  Surgeon:  Daneil Dolin, MD;  [facility];  Service: Endoscopy;  Laterality: N/A;  7:30 . MALONEY DILATION N/A 04/18/2017  Procedure: Venia Minks DILATION;  Surgeon: Daneil Dolin, MD;  [facility];  Service: Endoscopy;  Laterality: N/A; . PORTACATH PLACEMENT  06/2007  "removed ~ 1 yr later" . RIGHT HEART CATHETERIZATION N/A 02/24/2015  Procedure: RIGHT HEART CATH;  Surgeon: Jolaine Artist, MD;  Location: [facility] CATH LAB;  Service: Cardiovascular;  Laterality: N/A; . SAVORY DILATION N/A 07/02/2014  Procedure: SAVORY DILATION;  Surgeon: Daneil Dolin, MD;  [facility];  Service: Endoscopy;  Laterality: N/A;  7:30 HPI: Aryn C Ashleyis a 61 y.o.male,w Dm2, SAH, Pafib, Mild MR, Mild AR, CAD, CHF (EF 20%) ESRD on HD (M,W, F), apparently c/o cough with yellow sputum and  mild hemoptysis. Hewas found to have health care associated pneumonia. Admitted for the management of hcap. Most recent chest xray 10/4  (2 view) shows small to moderate layering right pleural effusion and associated right lower lobe obacity, likely atelectasis.   Bedside swallow evaluation completed 10/4 with findings of normal oropharyngeal function.  New orders 10/7 for repeat swallow assessment due to nursing observations of increased and consistent coughing with consumption of liquids  Subjective: drowsy Assessment / Plan / Recommendation CHL IP CLINICAL IMPRESSIONS 09/13/2017 Clinical Impression Pt presents with moderate-severe pharyngeal dysphagia characterized by reduced pharyngeal contraction, decreased epiglottic deflection and reduced hyolaryngeal excursion resulting in poor airway protection and vallecular residue. Silent aspiration and penetration noted with thin liquid; supraglottic swallow was unsuccessful at protecting airway during the swallow. Cued pt for effective throat clear with success. Pooling in the pyriform sinus post swallow 2/2 reduced pharyngeal contraction as well as spillage from vallecular residue; effortful swallow successful at reducing residue. Improvement in airway protection and reduction of residue noted using chin tuck with nectar-thick liquid. Recommend Dys 3 (mech soft) diet with nectar-thick liquid and total assist for cueing compensatory strategies (chin tuck and effortful swallow with throat clear); meds crushed in puree. [place] water protocol to be considered for pt comfort and to reduce the risk of dehydration. High risk of aspiration remains across all consistencies. Will continue to monitor for use of compensatory strategies, diet tolerance and begin RMST.  SLP Visit Diagnosis Dysphagia, pharyngeal phase (R13.13) Attention and concentration deficit following -- Frontal lobe and executive function deficit following -- Impact on safety and function Severe aspiration  risk   CHL IP TREATMENT RECOMMENDATION 09/13/2017 Treatment Recommendations Therapy as outlined in treatment plan below   Prognosis 09/13/2017 Prognosis for Safe Diet Advancement Fair Barriers to Reach Goals Severity of deficits;Motivation Barriers/Prognosis Comment -- CHL IP DIET RECOMMENDATION 09/13/2017 SLP Diet Recommendations Dysphagia 3 (Mech soft) solids;Nectar thick liquid Liquid Administration via Cup;Straw Medication Administration Crushed with puree Compensations Slow rate;Small sips/bites;Effortful swallow;Chin tuck;Clear throat after each swallow Postural Changes Seated upright at 90 degrees   CHL IP OTHER RECOMMENDATIONS 09/13/2017 Recommended Consults -- Oral Care Recommendations Oral care BID Other Recommendations --   CHL IP FOLLOW UP RECOMMENDATIONS 09/13/2017 Follow up Recommendations Skilled Nursing facility   [facility] IP FREQUENCY AND DURATION 09/13/2017 Speech Therapy Frequency (ACUTE ONLY) min 2x/week Treatment Duration 2 weeks      CHL IP ORAL PHASE 09/13/2017 Oral Phase WFL Oral - Pudding Teaspoon -- Oral - Pudding Cup -- Oral - Honey Teaspoon -- Oral - Honey Cup -- Oral - Nectar Teaspoon -- Oral - Nectar Cup -- Oral - Nectar Straw -- Oral - Thin Teaspoon --  Oral - Thin Cup -- Oral - Thin Straw -- Oral - Puree -- Oral - Mech Soft -- Oral - Regular -- Oral - Multi-Consistency -- Oral - Pill -- Oral Phase - Comment --  CHL IP PHARYNGEAL PHASE 09/13/2017 Pharyngeal Phase Impaired Pharyngeal- Pudding Teaspoon -- Pharyngeal -- Pharyngeal- Pudding Cup -- Pharyngeal -- Pharyngeal- Honey Teaspoon -- Pharyngeal -- Pharyngeal- Honey Cup Reduced epiglottic inversion;Reduced laryngeal elevation;Pharyngeal residue - valleculae;Pharyngeal residue - pyriform;Reduced pharyngeal peristalsis Pharyngeal -- Pharyngeal- Nectar Teaspoon -- Pharyngeal -- Pharyngeal- Nectar Cup Reduced epiglottic inversion;Reduced laryngeal elevation;Pharyngeal residue - valleculae;Pharyngeal residue - pyriform;Reduced pharyngeal  peristalsis;Penetration/Aspiration during swallow Pharyngeal Material enters airway, CONTACTS cords and then ejected out Pharyngeal- Nectar Straw Reduced epiglottic inversion;Reduced laryngeal elevation;Pharyngeal residue - valleculae;Pharyngeal residue - pyriform;Reduced pharyngeal peristalsis;Penetration/Aspiration during swallow Pharyngeal Material enters airway, CONTACTS cords and then ejected out Pharyngeal- Thin Teaspoon -- Pharyngeal -- Pharyngeal- Thin Cup Reduced epiglottic inversion;Reduced laryngeal elevation;Pharyngeal residue - valleculae;Pharyngeal residue - pyriform;Reduced pharyngeal peristalsis;Penetration/Aspiration during swallow;Reduced airway/laryngeal closure Pharyngeal Material enters airway, passes BELOW cords without attempt by patient to eject out (silent aspiration) Pharyngeal- Thin Straw Reduced epiglottic inversion;Reduced laryngeal elevation;Pharyngeal residue - valleculae;Pharyngeal residue - pyriform;Reduced pharyngeal peristalsis;Penetration/Aspiration during swallow;Reduced airway/laryngeal closure Pharyngeal Material enters airway, passes BELOW cords without attempt by patient to eject out (silent aspiration) Pharyngeal- Puree Reduced epiglottic inversion;Reduced laryngeal elevation;Pharyngeal residue - valleculae;Pharyngeal residue - pyriform;Reduced pharyngeal peristalsis Pharyngeal -- Pharyngeal- Mechanical Soft -- Pharyngeal -- Pharyngeal- Regular Reduced epiglottic inversion;Reduced laryngeal elevation;Pharyngeal residue - valleculae;Pharyngeal residue - pyriform;Reduced pharyngeal peristalsis Pharyngeal -- Pharyngeal- Multi-consistency -- Pharyngeal -- Pharyngeal- Pill -- Pharyngeal -- Pharyngeal Comment --  No flowsheet data found. No flowsheet data found. DeBlois, Katherene Ponto 09/13/2017, 11:39 AM              Medications: . sodium chloride     . colchicine  0.3 mg Oral Once per day on Mon Thu  . darbepoetin (ARANESP) injection - DIALYSIS  60 mcg Intravenous Q  Wed-HD  . feeding supplement (NEPRO CARB STEADY)  237 mL Oral BID BM  . feeding supplement (PRO-STAT SUGAR FREE 64)  30 mL Oral TID  . guaiFENesin  1,200 mg Oral BID  . insulin aspart  0-5 Units Subcutaneous QHS  . insulin aspart  0-9 Units Subcutaneous TID WC  . loratadine  10 mg Oral Daily  . midodrine  10 mg Oral TID WC  . multivitamin  1 tablet Oral QHS  . ondansetron (ZOFRAN) IV  4 mg Intravenous Q6H  . pantoprazole  40 mg Oral Daily  . sodium chloride flush  3 mL Intravenous Q12H  . Warfarin - Pharmacist Dosing Inpatient   Does not apply q1800    Dialysis Orders: [place]/ [facility] MWF 4h 3K/2.5 77kg Hep none R AVF - Mircera 50 ug q 2, to start 10/3 - Venofer 50/wk  Assessment/Plan: 1. HCAP: Per CT, BCx negative. Completed course of Vanc/Cefepime. S/p 1.8L thora 9/30; transudative effusion. Recurrent pleural effusion on CXR 09/08/17. 2. ESRD: Continue HD per MWF schedule, next 10/10.  3.Hypotension/volume. Will need lower EDW on d/c. Still has pitting edema abdomen and thigh. Continue attempting to lower volume as BP permits. Chronic hypotension on midodrine.  4. Anemia: Hgb 11.Continue Aranesp 23mcg weekly (last 10/3). 5. Secondary hyperparathyroidism:Corr Ca and Phos ok. Binders on hold. 6. Nutrition: Severe PCM.Albumin 1.8. Continue Nepro/Pro-stat supplements. 7. CHF/CM: EF 15-20%, Hx AICD 8. Afib: On warfarin 9. DM: On insulin. 10.Sacral decub: Unstageable. S/p hydrotherapy, now with wound vac. Followed by wound care. 11. Cirrhosis  12. Hx Rectal Ca: s/p diverting colostomy  13. Dispo: Will be discharged to SNF soon.Requesting transfer of dialysis centers (to [facility]). This was approved and will be starting at [facility] on discharge. Will try to have sit in chair for HD today.  Veneta Penton, PA-C 09/14/2017, 9:10 AM  [employer] Pager: [phone]

## 2017-09-14 NOTE — Progress Notes (Signed)
[place] for Warfarin Indication: atrial fibrillation  No Known Allergies  Patient Measurements: Height: 5\' 10"  (177.8 cm) Weight: 155 lb (70.3 kg) IBW/kg (Calculated) : 73  Vital Signs: Temp: 98 F (36.7 C) (10/10 0958) Temp Source: Oral (10/10 0958) BP: 110/66 (10/10 0958) Pulse Rate: 82 (10/10 0958)  Labs:  Recent Labs  09/12/17 0313 09/12/17 0823 09/13/17 0502 09/14/17 1029  HGB 11.3*  --  11.0* 11.0*  HCT 34.4*  --  34.1* 33.3*  PLT 94*  --  83* 70*  LABPROT 40.3*  --  31.8* 32.4*  INR 4.21*  --  3.11 3.19  CREATININE  --  3.74*  --  3.86*    Estimated Creatinine Clearance: 19.7 mL/min (A) (by C-G formula based on SCr of 3.86 mg/dL (H)).   Assessment: 9 YOM with AFib on warfarin. PTA the patient had INR checked on 9/28 which resulted at 1.8 and dose was changed to 1mg  daily except 2mg  on Fridays (previously 1mg  every day). PTA dose last taken on 9/28.  He was admitted to [facility] on 09/03/17 for pneumonia, HCAP.  The INR on admission 9/29 was 2.37 therapeutic.   INR today remains SUPRAtherapeutic despite holding the dose yesterday evening (INR 3.19 << 3.11, goal of 2-3). Noted chronic thrombocytopenia with plts 70. Will watch and consider discussing warfarin interruption if plts fall <50.  Goal of Therapy:  INR 2-3 Monitor platelets by anticoagulation protocol: Yes   Plan:  1. Hold warfarin dose again today 2. Will continue to monitor for any signs/symptoms of bleeding and will follow up with PT/INR in the a.m.  Thank you for allowing pharmacy to be a part of this patient's care.  Alycia Rossetti, PharmD, BCPS Clinical Pharmacist Pager: [phone] Clinical phone for 09/14/2017 from 7a-3:30p: [phone] If after 3:30p, please call main pharmacy at: [phone] 09/14/2017 3:03 PM

## 2017-09-14 NOTE — Progress Notes (Signed)
Physical Therapy Treatment Patient Details Name: [name] MRN: [id number] DOB: 11-16-1956 Today's Date: 09/14/2017    History of Present Illness  [name] is a 61 y.o. male with ESRD, HD initiated 07/2017 during recent [facility] admit with MSSA bacteremia/shock. PMH includes  HTN, Type 2 DM, Hx Rectal cancer (s/p colostomy 2008), Afib (on warfarin), CAD (s/p stents), ischemic CM (EF 15-20%, ICD in place), OSA on CPAP, cirrhosis, thrombocytopenia. Admitted with productive cough and hemoptysis; suspicious for pneumonia; order for PT Hydro to follow for sacral ulcer.    PT Comments    Continuing work on functional mobility and activity tolerance;  Given how exhausted [name] was after hallway amb yesterday, opted to co-treat, and not push distance as much;   Lengthy amount of time spent on finding optimal positioning for pt in HD chair so that he can dialize in recliner in prep for going to HD from SNF once dc'd (and eventually going to outpt HD from home as well); We found an acceptable semi-reclined position, and pt opted to stay in recliner, even with the knowledge that it would be about an hour and a half before he would be going to HD      Follow Up Recommendations  SNF     Equipment Recommendations  None recommended by PT    Recommendations for Other Services       Precautions / Restrictions Precautions Precautions: Fall (Simultaneous filing. User may not have seen previous data.) Precaution Comments: sacral ulcer (Simultaneous filing. User may not have seen previous data.) Restrictions Weight Bearing Restrictions: No    Mobility  Bed Mobility Overal bed mobility: Needs Assistance Bed Mobility: Rolling;Sidelying to Sit Rolling: Min assist Sidelying to sit: Mod assist       General bed mobility comments: Needing heavy mod assist to push up to sit; difficulty attributable to fatigue, but also to air mattress   Transfers Overall transfer level: Needs  assistance Equipment used: Rolling walker (2 wheeled) Transfers: Sit to/from Stand Sit to Stand: Min assist         General transfer comment: Cues for hand placement; min assist to steady with rise  Ambulation/Gait Ambulation/Gait assistance: Min assist;+2 safety/equipment Ambulation Distance (Feet): 10 Feet (in room amb) Assistive device: Rolling walker (2 wheeled) Gait Pattern/deviations: Step-through pattern;Decreased step length - right;Decreased step length - left;Trunk flexed Gait velocity: slowed   General Gait Details: Cues to self-monitor for activity tolerance   Stairs            Wheelchair Mobility    Modified Rankin (Stroke Patients Only)       Balance     Sitting balance-Leahy Scale: Good       Standing balance-Leahy Scale: Fair                              Cognition Arousal/Alertness: Awake/alert Behavior During Therapy: WFL for tasks assessed/performed Overall Cognitive Status: Within Functional Limits for tasks assessed (for simple mobility acts)                                        Exercises      General Comments General comments (skin integrity, edema, etc.): Lengthy amount of time spent on finding optimal positioning for pt in HD chair so that he can dialize in recliner in prep for going to HD  from SNF once dc'd (and eventually going to outpt HD from home as well); We found an acceptable semi-reclined position, and pt opted to stay in recliner, even with the knowledge that it would be about an hour and a half before he would be going to HD      Pertinent Vitals/Pain Pain Assessment: Faces (Simultaneous filing. User may not have seen previous data.) Faces Pain Scale: Hurts little more Pain Location: sacrum Pain Descriptors / Indicators: Discomfort;Sore Pain Intervention(s): Monitored during session;Other (comment) (Discussed medicating for pain pre-HD)    Home Living                      Prior  Function            PT Goals (current goals can now be found in the care plan section) Acute Rehab PT Goals Patient Stated Goal: get back to normal PT Goal Formulation: With patient Time For Goal Achievement: 09/19/17 Potential to Achieve Goals: Good Progress towards PT goals: Progressing toward goals    Frequency    Min 3X/week      PT Plan Current plan remains appropriate    Co-evaluation PT/OT/SLP Co-Evaluation/Treatment: Yes Reason for Co-Treatment: For patient/therapist safety;Other (comment) (patient-centerd decision based on activity tolerance yesterday) PT goals addressed during session: Mobility/safety with mobility;Balance;Proper use of DME OT goals addressed during session: ADL's and self-care      AM-PAC PT "6 Clicks" Daily Activity  Outcome Measure  Difficulty turning over in bed (including adjusting bedclothes, sheets and blankets)?: A Lot Difficulty moving from lying on back to sitting on the side of the bed? : A Lot Difficulty sitting down on and standing up from a chair with arms (e.g., wheelchair, bedside commode, etc,.)?: A Little Help needed moving to and from a bed to chair (including a wheelchair)?: A Little Help needed walking in hospital room?: A Little Help needed climbing 3-5 steps with a railing? : A Lot 6 Click Score: 15    End of Session Equipment Utilized During Treatment: Gait belt Activity Tolerance: Patient tolerated treatment well Patient left: with call bell/phone within reach;in chair Nurse Communication: Mobility status PT Visit Diagnosis: Unsteadiness on feet (R26.81);Muscle weakness (generalized) (M62.81);Other abnormalities of gait and mobility (R26.89);Pain Pain - part of body:  (sacrum)     Time: 3546-5681 PT Time Calculation (min) (ACUTE ONLY): 38 min  Charges:  $Therapeutic Activity: 8-22 mins                    G Codes:       Roney Marion, PT  Acute Rehabilitation Services Pager [phone] Office  [phone]    Colletta [place] 09/14/2017, 12:36 PM

## 2017-09-14 NOTE — Progress Notes (Signed)
  Speech Language Pathology Treatment: Dysphagia  Patient Details Name: [name] MRN: [id number] DOB: 09-Jul-1956 Today's Date: 09/14/2017 Time: 8921-1941 SLP Time Calculation (min) (ACUTE ONLY): 18 min  Assessment / Plan / Recommendation Clinical Impression  Pt was sitting upright in his chair eating his lunch with prolonged coughing upon SLP arrival. SLP encouraged a break from POs and provided Min cues for use of throat clearing as well, which was found to be effective on MBS. Coughing subsided but was not eliminated when POs were stopped. His cough was productive of what looked like mostly secretions but also with small pieces of spaghetti/meat sauce. Once coughing settled, SLP provided Min cues for use of complete chin tuck and effortful swallow with straw and cup sips of nectar thick liquids. Audible wetness and delayed coughing was elicited. Pt and RN both deny significant coughing during breakfast meal. SLP removed tray from pt's bedside for now. Given that these recommendations were found to be effective on MBS on previous date and that he seemed to tolerate breakfast well, I think it would be okay to re-attempt POs later today with FULL supervision for use of compensatory strategies. Should coughing increase with meal, would hold POs until SLP can reassess. Will f/u closely for tolerance and initiation of EMST.   HPI HPI: [name] [name] a 61 y.o.male,w Dm2, SAH, Pafib, Mild MR, Mild AR, CAD, CHF (EF 20%) ESRD on HD (M,W, F), apparently c/o cough with yellow sputum and mild hemoptysis. Hewas found to have health care associated pneumonia. Admitted for the management of hcap. Most recent chest xray 10/4  (2 view) shows small to moderate layering right pleural effusion and associated right lower lobe obacity, likely atelectasis.   Bedside swallow evaluation completed 10/4 with findings of normal oropharyngeal function.  New orders 10/7 for repeat swallow assessment due to nursing  observations of increased and consistent coughing with consumption of liquids       SLP Plan  Continue with current plan of care       Recommendations  Diet recommendations: Dysphagia 3 (mechanical soft);Nectar-thick liquid Liquids provided via: Cup;Straw Medication Administration: Whole meds with puree Supervision: Patient able to self feed;Full supervision/cueing for compensatory strategies Compensations: Slow rate;Small sips/bites;Effortful swallow;Chin tuck;Clear throat after each swallow Postural Changes and/or Swallow Maneuvers: Seated upright 90 degrees;Upright 30-60 min after meal                Oral Care Recommendations: Oral care BID Follow up Recommendations: Skilled Nursing facility SLP Visit Diagnosis: Dysphagia, pharyngeal phase (R13.13) Plan: Continue with current plan of care       GO                [name] 09/14/2017, 2:22 PM  [name], M.A. CCC-SLP [phone]

## 2017-09-14 NOTE — Consult Note (Signed)
   [facility] CM Inpatient Consult   09/14/2017  [name] 08/03/1956 [id number]    Patient screened for potential [facility] Care Management services.   Chart reviewed. Noted [name]'s discharge plan is for SNF.   Confirmed with inpatient RNCM.   No identifiable [facility] Care Management needs at this time.   Marthenia Rolling, MSN-Ed, RN,BSN [facility] Liaison [phone]

## 2017-09-14 NOTE — Progress Notes (Signed)
Occupational Therapy Treatment Patient Details Name: [name] MRN: [id number] DOB: 03/27/1956 Today's Date: 09/14/2017    History of present illness  [name] is a 61 y.o. male with ESRD, HD initiated 07/2017 during recent [facility] admit with MSSA bacteremia/shock. PMH includes  HTN, Type 2 DM, Hx Rectal cancer (s/p colostomy 2008), Afib (on warfarin), CAD (s/p stents), ischemic CM (EF 15-20%, ICD in place), OSA on CPAP, cirrhosis, thrombocytopenia. Admitted with productive cough and hemoptysis; suspicious for pneumonia; order for PT Hydro to follow for sacral ulcer.   OT comments  Pt progressing towards OT goals this session, short ambulation to sink for standing grooming, and then extensive time taken to make sure Pt was comfortable in HD chair with sacral wound (and vac). Pt continues to be very motivated to work with therapy, but limited by fatigue. Pt encouraged to participate in own self-care as functional fitness to increase activity tolerance, pt verbalized understanding. OT will continue to follow in the acute setting.   Follow Up Recommendations  SNF    Equipment Recommendations  Other (comment) (defer to next venue)    Recommendations for Other Services      Precautions / Restrictions Precautions Precautions: Fall Precaution Comments: sacral ulcer (wound vac)       Mobility Bed Mobility Overal bed mobility: Needs Assistance Bed Mobility: Rolling;Sidelying to Sit Rolling: Min assist Sidelying to sit: Mod assist       General bed mobility comments: Needing heavy mod assist to push up to sit; difficulty attributable to fatigue, but also to air mattress   Transfers Overall transfer level: Needs assistance Equipment used: Rolling walker (2 wheeled) Transfers: Sit to/from Stand Sit to Stand: Min assist;+2 safety/equipment         General transfer comment: Cues for hand placement; min assist to steady with rise    Balance Overall balance assessment: Needs  assistance Sitting-balance support: Single extremity supported;Feet supported Sitting balance-Leahy Scale: Good Sitting balance - Comments: uncomfortable due to wound   Standing balance support: Bilateral upper extremity supported;During functional activity Standing balance-Leahy Scale: Fair Standing balance comment: able to perform static standing at sink leaning with hips                           ADL either performed or assessed with clinical judgement   ADL Overall ADL's : Needs assistance/impaired     Grooming: Wash/dry hands;Wash/dry face;Oral care;Min guard;Standing Grooming Details (indicate cue type and reason): standing at sink                 Toilet Transfer: Minimal assistance;BSC;RW Armed forces technical officer Details (indicate cue type and reason): simulated through recliner transfer this session         Functional mobility during ADLs: Minimal assistance;Rolling walker;+2 for safety/equipment (+2 to manage chair follow, wound vac)       Vision       Perception     Praxis      Cognition Arousal/Alertness: Awake/alert Behavior During Therapy: WFL for tasks assessed/performed Overall Cognitive Status: Within Functional Limits for tasks assessed (for simple ADL and functional transfers)                                          Exercises     Shoulder Instructions       General Comments Lengthy amount of time spent  on finding optimal positioning for pt in HD chair so that he can dialize in recliner in prep for going to HD from SNF once dc'd (and eventually going to outpt HD from home as well); We found an acceptable semi-reclined position, and pt opted to stay in recliner, even with the knowledge that it would be about an hour and a half before he would be going to HD    Pertinent Vitals/ Pain       Pain Assessment: Faces (Pt denies pain) Faces Pain Scale: Hurts little more Pain Location: sacrum Pain Descriptors / Indicators:  Discomfort;Sore;Grimacing Pain Intervention(s): Monitored during session;Repositioned  Home Living                                          Prior Functioning/Environment              Frequency  Min 2X/week        Progress Toward Goals  OT Goals(current goals can now be found in the care plan section)  Progress towards OT goals: Progressing toward goals  Acute Rehab OT Goals Patient Stated Goal: get back to normal OT Goal Formulation: With patient Time For Goal Achievement: 09/22/17 Potential to Achieve Goals: [place] Discharge plan remains appropriate    Co-evaluation    PT/OT/SLP Co-Evaluation/Treatment: Yes Reason for Co-Treatment: For patient/therapist safety;To address functional/ADL transfers;Other (comment) (patient-centered decision based on activity tolerance) PT goals addressed during session: Mobility/safety with mobility;Balance;Proper use of DME OT goals addressed during session: ADL's and self-care      AM-PAC PT "6 Clicks" Daily Activity     Outcome Measure   Help from another person eating meals?: None Help from another person taking care of personal grooming?: A Little Help from another person toileting, which includes using toliet, bedpan, or urinal?: A Lot Help from another person bathing (including washing, rinsing, drying)?: A Lot Help from another person to put on and taking off regular upper body clothing?: A Little Help from another person to put on and taking off regular lower body clothing?: A Lot 6 Click Score: 16    End of Session Equipment Utilized During Treatment: Rolling walker  OT Visit Diagnosis: Unsteadiness on feet (R26.81);Other abnormalities of gait and mobility (R26.89);Muscle weakness (generalized) (M62.81);Pain Pain - Right/Left: Right Pain - part of body: Leg (butt)   Activity Tolerance Patient limited by fatigue   Patient Left in chair;with call bell/phone within reach;with nursing/sitter in  room   Nurse Communication Mobility status        Time: 9741-6384 OT Time Calculation (min): 38 min  Charges: OT General Charges $OT Visit: 1 Visit OT Treatments $Self Care/Home Management : 23-37 mins  Hulda Humphrey OTR/L [place] 09/14/2017, 1:01 PM

## 2017-09-14 NOTE — Progress Notes (Signed)
Dressing changed to right heel, toe, and leg per order. No complications. Patient tolerated well. Will continue to monitor.

## 2017-09-14 NOTE — Clinical Social Work Note (Signed)
Patient going to [place] rehab at [facility] at discharge and is requesting to dialyze at [facility] while in rehab. CSW reviewed nephrology notes which indicated that patient was being transferred from [facility] dialysis to [facility]. CSW went to [facility] dialyses center and talked with renal PA Veneta Penton regarding patient and call made to [facility]. They can take patient, but will need clinicals sent to [facility] (phone number [phone]). Call made to [facility] and spoke with Olivia Mackie with Admissions. CSW instructed to send necessary clinicals to them - fax [phone]. Clinicals faxed to [facility] and call made to Cleon Dew with [facility] and update provided. CSW will continue to follow and facilitate discharge to [facility] once patient is set-up for HD at [facility].   Alice Vitelli Givens, MSW, LCSW Licensed Clinical Social Worker [place] [phone]

## 2017-09-14 NOTE — Progress Notes (Signed)
PROGRESS NOTE    [name]  GEX:[id number] DOB: 1956-04-24 DOA: 09/03/2017 PCP: Redmond School, MD   Brief Narrative:  [name] is a 61 y.o. male,w Dm2, SAH, PAfib, cirrhosis, Mild MR, Mild AR, CAD, CHF (EF 20%) ESRD on HD (M,W, F), apparently c/o cough with yellow sputum and mild hemoptysis. Hewas found to have health care associated pneumonia. Admitted for the management of HCAP. Currently in process of transitioning Dialysis Centers. Has a large Sacral Decubitus that WOC has been working with and now has a Wound Vac. In the process of having Dialysis Centers changed and getting close to Discharging to SNF.   Assessment & Plan:   Principal Problem:   HCAP (healthcare-associated pneumonia) Active Problems:   DM type 2, uncontrolled, with renal complications (HCC)   Abnormal LFTs   Hyponatremia   Hemoptysis   Status post thoracentesis   Colostomy in place [facility])   ESRD on dialysis ([place])   Malnutrition of moderate degree  HCAP/Aspiration Pneumonia -Patient treated with vancomycin and cefepime.  -Blood cultures without growth. No sputum culture sent on admission. Concern for worsening infiltrate, however, patient has been afebrile and with a normal WBC. Completed antibiotic course. -sputum gram stain/culture (no sample sent) -Continue to Monitor  Dysphagia -SLP recommendations: barium swallow 10/9 -Continue dysphagia 3 diet  Pleural Effusion -Appears to likely be transudate. Possibly from heart failure. -Asymptomatic. -Had Thoracentesis s/p 1.8 Liters on 9/30 and re-occurred on 09/08/17  Sacral Decubitus Ulcer -Unstageable. Afebrile. -S/pHydrotherapy -Wound care recommendations: plan for wound vac and was applied and will need to continue   Diabetes mellitus -continue SSI  Hypotension -Chronic. -Continue Midodrine 10 mg po TIDwm  Hemoptysis -Resolved. Secondary to HCAP and frequent coughing.  ESRD on Hemodialysis -Appreciate Nephrology  Recommendations -C/w Rena-Vit -In the process of Switching Dialysis Centers from [facility] to [facility]; Per Nephrology was approved and will be starting at [facility] and will have patient to sit in chair for HD -Will discuss with Nephro until patient can be clear to D/C to SNF  Thrombocytopenia -Stable. Chronic.  Leukocytosis -Improved. WBC went from 15.9 -> 8.5 -Continue to Monitor for S/Sx of Infection -Repeat CBC in Am   Atrial Fibrillation -INR supratherapeutic at 3.19. Improving. -Continue Coumadin per pharmacy  Cirrhosis/Hyperbilirubinemia -Bilirubin essentially the same at 3.1.  -Likely made worse by hypotension. Outpatient follow-up  History of Rectal Carcinoma -S/p LLQ diverting colostomy. Stable.  Right great toe ulcer -Cold foot. Pulses palpable. Previous ABI significant for elevated ABIs secondary to early calcification. Recurrent ABI similar.  Hyponatremia -Chronic. Sodium was now 129 -Asymptomatic. Patient with cirrhosis, ESRD and heart failure -C/w Dialysis   Chronic Systolic Heart Failure -EF of 15-20% with diffuse hypokinesis and apical akinesis. -C/w Dialysis for volume maintenance -Declined palliative care consult  DVT prophylaxis: Anticoagulated with Coumadin  Code Status: FULL CODE Family Communication: No family present at bedside Disposition Plan: D/C to SNF when Dialysis Center Set Up  Consultants:   Nephrology   Procedures:  Hemodialysis    Antimicrobials:  Anti-infectives    Start     Dose/Rate Route Frequency Ordered Stop   09/07/17 1735  Vancomycin (VANCOCIN) 750-5 MG/150ML-% IVPB  Status:  Discontinued    Comments:  [name], [name]   : cabinet override      09/07/17 1735 09/07/17 1817   09/05/17 1200  vancomycin (VANCOCIN) IVPB 750 mg/150 ml premix  Status:  Discontinued     750 mg 150 mL/hr over 60 Minutes Intravenous Every M-W-F (  Hemodialysis) 09/03/17 0554 09/07/17 1402   09/03/17 1230  ceFEPIme  (MAXIPIME) 2 g in dextrose 5 % 50 mL IVPB     2 g 100 mL/hr over 30 Minutes Intravenous  Once 09/03/17 1208 09/03/17 1601   09/03/17 0600  vancomycin (VANCOCIN) 1,500 mg in sodium chloride 0.9 % 500 mL IVPB     1,500 mg 250 mL/hr over 120 Minutes Intravenous  Once 09/03/17 0553 09/03/17 0826   09/03/17 0600  ceFEPIme (MAXIPIME) 2 g in dextrose 5 % 50 mL IVPB  Status:  Discontinued     2 g 100 mL/hr over 30 Minutes Intravenous Every M-W-F (2000) 09/03/17 0553 09/10/17 1142     Subjective: Seen and examined at beside and was doing ok. No CP or SOB. States it was hard to move. No other complaints or concerns.   Objective: Vitals:   09/14/17 1600 09/14/17 1630 09/14/17 1700 09/14/17 1730  BP: (!) 96/56 (!) 93/59 (!) 101/57 (!) 94/56  Pulse: 72 78 74 80  Resp:      Temp:      TempSrc:      SpO2:      Weight:      Height:        Intake/Output Summary (Last 24 hours) at 09/14/17 1810 Last data filed at 09/14/17 1300  Gross per 24 hour  Intake              423 ml  Output               30 ml  Net              393 ml   Filed Weights   09/12/17 1225 09/14/17 0448  Weight: 72.6 kg (160 lb) 70.3 kg (155 lb)   Examination: Physical Exam:  Constitutional: NAD and appears calm and comfortable Eyes: Lids and conjunctivae normal, sclerae anicteric  ENMT: External Ears, Nose appear normal. Grossly normal hearing. Mucous membranes are moist.  Neck: Appears normal, supple, no cervical masses, normal ROM, no appreciable thyromegaly, no JVD Respiratory: Diminished to auscultation bilaterally, no wheezing, rales, rhonchi or crackles. Normal respiratory effort and patient is not tachypenic. No accessory muscle use.  Cardiovascular: RRR, no murmurs / rubs / gallops. S1 and S2 auscultated. 1-2+ LE Edema.  Abdomen: Soft, non-tender, non-distended. No masses palpated. No appreciable hepatosplenomegaly. Bowel sounds positive. Has A LLQ Colostomy in Place  GU: Deferred. Musculoskeletal: No  contractures.  Skin: Sacral Ulcer not viewed. Has a toe ulcer on Right. Has RUE AVF.  Neurologic: CN 2-12 grossly intact with no focal deficits. Romberg sign cerebellar reflexes not assessed.  Psychiatric: Normal judgment and insight. Alert and oriented x 3. Normal mood and appropriate affect.   Data Reviewed: I have personally reviewed following labs and imaging studies  CBC:  Recent Labs Lab 09/10/17 0325 09/11/17 0242 09/12/17 0313 09/13/17 0502 09/14/17 1029  WBC 6.8 8.5 8.2 8.1 8.5  NEUTROABS  --   --   --   --  7.1  HGB 10.9* 11.3* 11.3* 11.0* 11.0*  HCT 34.1* 34.9* 34.4* 34.1* 33.3*  MCV 100.3* 99.1 100.0 100.0 100.9*  PLT 88* 94* 94* 83* 70*   Basic Metabolic Panel:  Recent Labs Lab 09/09/17 0654 09/10/17 0325 09/12/17 0823 09/14/17 1029  NA 125* 128* 126* 129*  K 3.9 3.8 4.5 4.3  CL 90* 91* 90* 93*  CO2 24 24 22 22   GLUCOSE 115* 160* 136* 163*  BUN 39* 28* 64* 48*  CREATININE 3.60*  2.57* 3.74* 3.86*  CALCIUM 8.5* 8.3* 8.7* 8.5*  MG  --   --   --  2.1  PHOS 3.1  --  4.1 4.6   GFR: Estimated Creatinine Clearance: 19.7 mL/min (A) (by C-G formula based on SCr of 3.86 mg/dL (H)). Liver Function Tests:  Recent Labs Lab 09/09/17 0654 09/10/17 0325 09/12/17 0823 09/14/17 1029  AST  --  80*  --  103*  ALT  --  30  --  38  ALKPHOS  --  173*  --  195*  BILITOT  --  2.6*  --  3.1*  PROT  --  6.8  --  6.9  ALBUMIN 2.1* 2.0* 1.8* 1.9*   No results for input(s): LIPASE, AMYLASE in the last 168 hours. No results for input(s): AMMONIA in the last 168 hours. Coagulation Profile:  Recent Labs Lab 09/10/17 0325 09/11/17 0242 09/12/17 0313 09/13/17 0502 09/14/17 1029  INR 2.97 3.54 4.21* 3.11 3.19   Cardiac Enzymes: No results for input(s): CKTOTAL, CKMB, CKMBINDEX, TROPONINI in the last 168 hours. BNP (last 3 results) No results for input(s): PROBNP in the last 8760 hours. HbA1C: No results for input(s): HGBA1C in the last 72 hours. CBG:  Recent  Labs Lab 09/13/17 1216 09/13/17 1641 09/13/17 2042 09/14/17 0732 09/14/17 1149  GLUCAP 157* 149* 137* 137* 161*   Lipid Profile: No results for input(s): CHOL, HDL, LDLCALC, TRIG, CHOLHDL, LDLDIRECT in the last 72 hours. Thyroid Function Tests: No results for input(s): TSH, T4TOTAL, FREET4, T3FREE, THYROIDAB in the last 72 hours. Anemia Panel: No results for input(s): VITAMINB12, FOLATE, FERRITIN, TIBC, IRON, RETICCTPCT in the last 72 hours. Sepsis Labs: No results for input(s): PROCALCITON, LATICACIDVEN in the last 168 hours.  No results found for this or any previous visit (from the past 240 hour(s)).   Radiology Studies: Dg Swallowing Func-speech Pathology  Result Date: 09/13/2017 Completed by Aaron Edelman, SLP Student Supervised and reviewed by Herbie Baltimore MA CCC-SLP Objective Swallowing Evaluation: Type of Study: MBS-Modified Barium Swallow Study Patient Details Name: [name] MRN: [id number] Date of Birth: Sep 29, 1956 Today's Date: 09/13/2017 Time: SLP Start Time (ACUTE ONLY): 1009-SLP Stop Time (ACUTE ONLY): 1039 SLP Time Calculation (min) (ACUTE ONLY): 30 min Past Medical History: Past Medical History: Diagnosis Date . Adenocarcinoma of rectum ([place])   a. 2008-colostomy . AICD (automatic cardioverter/defibrillator) present 2002  BI V ICD . Anemia  . CAD (coronary artery disease)   a. BMS [facility] LAD 2001 [facility] b. PTCA/atherectomy ramus and BMS to LAD 2009 . CHF (congestive heart failure) ([place])  . Cholelithiasis 06/2015 . Chronic kidney disease, stage IV (severe) ([place])  . Chronic systolic heart failure ([place])  . Cirrhosis ([place])  . Colostomy in place [facility])  . Dizziness   a. chronic. Admission for this 07/18/2014 . DM type 2, uncontrolled, with renal complications ([place])  . Dysrhythmia  . Essential hypertension, benign  . GERD (gastroesophageal reflux disease)  . HCAP (healthcare-associated pneumonia) 07/21/2015 . Hematuria  . History of blood transfusion   "I've had 2 units so far this  year" (09/27/2015) . HLD (hyperlipidemia)  . Ischemic cardiomyopathy   EF 18% by nuclear study 2016, multiple myocardial infarctions in past   . Myocardial infarction ([place]) 2001 . Obesity  . Orthostatic hypotension  . OSA on CPAP  . Paroxysmal atrial fibrillation (HCC)   a. on amiodarone, digoxin and Eliquis . PONV (postoperative nausea and vomiting)  . Presence of permanent cardiac pacemaker  . Prostate cancer ([place])  a. s/p seed implants with chemo and radiation . SAH (subarachnoid hemorrhage) ([place])   post-traumatic (fall) [facility] 12/2014 . TIA (transient ischemic attack)  Past Surgical History: Past Surgical History: Procedure Laterality Date . Abdominal and Perineal Resection of Rectum with Total Mesorectal Excision  10/04/2007 . AV FISTULA PLACEMENT Right 09/16/2015  Procedure: ARTERIOVENOUS (AV) FISTULA CREATION - BRACHIOCEPHALIC;  Surgeon: Elam Dutch, MD;  Location: [place];  Service: Vascular;  Laterality: Right; . BI-VENTRICULAR IMPLANTABLE CARDIOVERTER DEFIBRILLATOR  (CRT-D)  2009 . CARDIAC CATHETERIZATION  08/2001; 2009  ; [name] 07/10/2013 . CARDIAC DEFIBRILLATOR PLACEMENT  2002 . CARDIAC DEFIBRILLATOR PLACEMENT  2009  Upgraded to a BiV ICD . COLONOSCOPY  09/14/2011  Dr. Gala Romney: via colostomy, Single pedunculated benign inflammatory polyp. Due for surveillance Oct 2015 . COLONOSCOPY N/A 07/02/2014  Procedure: COLONOSCOPY;  Surgeon: Daneil Dolin, MD;  [facility];  Service: Endoscopy;  Laterality: N/A;  7:30 / COLONOSCOPY THRU COLOSTOMY . COLONOSCOPY N/A 12/11/2014  Dr. Gala Romney via colostomy. Normal. Repeat in 2021.  Marland Kitchen COLONOSCOPY N/A 08/24/2015  Dr. Havery Moros: diminutive polyp not removed, stoma site of bleeding . COLOSTOMY  2008 . CORONARY ANGIOPLASTY WITH STENT PLACEMENT  2001; ~ 2006  "1 + 1"  . ELECTROPHYSIOLOGIC STUDY N/A 08/28/2015  Procedure: AV Node Ablation;  Surgeon: Will Meredith Leeds, MD;  Location: [place] CV LAB;  Service: Cardiovascular;  Laterality: N/A; . EP IMPLANTABLE DEVICE N/A  04/10/2015  Procedure: Ppm/Biv Ppm Generator Changeout;  Surgeon: Evans Lance, MD;  Location: [place] INVASIVE CV LAB CUPID;  Service: Cardiovascular;  Laterality: N/A; . ERCP N/A 06/11/2016  Procedure: ENDOSCOPIC RETROGRADE CHOLANGIOPANCREATOGRAPHY (ERCP);  Surgeon: Teena Irani, MD;  Location: [facility] ENDOSCOPY;  Service: Endoscopy;  Laterality: N/A; . ERCP N/A 01/26/2017  Procedure: ENDOSCOPIC RETROGRADE CHOLANGIOPANCREATOGRAPHY (ERCP);  Surgeon: Teena Irani, MD;  Location: [facility] ENDOSCOPY;  Service: Endoscopy;  Laterality: N/A; . ERCP  06/2016  Dr. Amedeo Plenty: duodenal fistula, dilated bile duct but no obvious stones, s/p sphincterotomy and stent placement . ERCP  01/2017  Dr. Amedeo Plenty: CBD stones, s/p sphincterotomy, balloon extraction, stent removal . ESOPHAGOGASTRODUODENOSCOPY N/A 07/02/2014  Procedure: ESOPHAGOGASTRODUODENOSCOPY (EGD);  Surgeon: Daneil Dolin, MD;  [facility];  Service: Endoscopy;  Laterality: N/A;  7:30 . ESOPHAGOGASTRODUODENOSCOPY N/A 12/11/2014  URK:YHCWCB EGD . ESOPHAGOGASTRODUODENOSCOPY (EGD) WITH PROPOFOL N/A 04/18/2017  Procedure: ESOPHAGOGASTRODUODENOSCOPY (EGD) WITH PROPOFOL;  Surgeon: Daneil Dolin, MD;  [facility];  Service: Endoscopy;  Laterality: N/A;  2:30pm . GASTROINTESTINAL STENT REMOVAL N/A 01/26/2017  Procedure: GASTROINTESTINAL STENT REMOVAL;  Surgeon: Teena Irani, MD;  Location: [place];  Service: Endoscopy;  Laterality: N/A; . GIVENS CAPSULE STUDY N/A 07/23/2015  Dr. Michail Sermon: small non-bleeding AVM, mild gastritis . IR FLUORO GUIDE CV LINE RIGHT  07/27/2017 . IR REMOVAL TUN CV CATH W/O FL  08/12/2017 . IR US GUIDE VASC ACCESS RIGHT  07/27/2017 . LEFT HEART CATHETERIZATION WITH CORONARY ANGIOGRAM N/A 07/13/2013  Procedure: LEFT HEART CATHETERIZATION WITH CORONARY ANGIOGRAM;  Surgeon: Lorretta Harp, MD;  Location[facility] CATH LAB;  Service: Cardiovascular;  Laterality: N/A; . Venia Minks DILATION N/A 07/02/2014  Procedure: Venia Minks DILATION;  Surgeon: Daneil Dolin, MD;  [facility];  Service: Endoscopy;  Laterality: N/A;  7:30 . MALONEY DILATION N/A 04/18/2017  Procedure: Venia Minks DILATION;  Surgeon: Daneil Dolin, MD;  [facility];  Service: Endoscopy;  Laterality: N/A; . PORTACATH PLACEMENT  06/2007  "removed ~ 1 yr later" . RIGHT HEART CATHETERIZATION N/A 02/24/2015  Procedure: RIGHT  HEART CATH;  Surgeon: Jolaine Artist, MD;  Location: [facility] CATH LAB;  Service: Cardiovascular;  Laterality: N/A; . SAVORY DILATION N/A 07/02/2014  Procedure: SAVORY DILATION;  Surgeon: Daneil Dolin, MD;  [facility];  Service: Endoscopy;  Laterality: N/A;  7:30 HPI: [name] a 61 y.o.male,w Dm2, SAH, Pafib, Mild MR, Mild AR, CAD, CHF (EF 20%) ESRD on HD (M,W, F), apparently c/o cough with yellow sputum and mild hemoptysis. Hewas found to have health care associated pneumonia. Admitted for the management of hcap. Most recent chest xray 10/4  (2 view) shows small to moderate layering right pleural effusion and associated right lower lobe obacity, likely atelectasis.   Bedside swallow evaluation completed 10/4 with findings of normal oropharyngeal function.  New orders 10/7 for repeat swallow assessment due to nursing observations of increased and consistent coughing with consumption of liquids  Subjective: drowsy Assessment / Plan / Recommendation CHL IP CLINICAL IMPRESSIONS 09/13/2017 Clinical Impression Pt presents with moderate-severe pharyngeal dysphagia characterized by reduced pharyngeal contraction, decreased epiglottic deflection and reduced hyolaryngeal excursion resulting in poor airway protection and vallecular residue. Silent aspiration and penetration noted with thin liquid; supraglottic swallow was unsuccessful at protecting airway during the swallow. Cued pt for effective throat clear with success. Pooling in the pyriform sinus post swallow 2/2 reduced pharyngeal contraction as well as spillage from vallecular residue; effortful swallow successful  at reducing residue. Improvement in airway protection and reduction of residue noted using chin tuck with nectar-thick liquid. Recommend Dys 3 (mech soft) diet with nectar-thick liquid and total assist for cueing compensatory strategies (chin tuck and effortful swallow with throat clear); meds crushed in puree. [place]ier water protocol to be considered for pt comfort and to reduce the risk of dehydration. High risk of aspiration remains across all consistencies. Will continue to monitor for use of compensatory strategies, diet tolerance and begin RMST.  SLP Visit Diagnosis Dysphagia, pharyngeal phase (R13.13) Attention and concentration deficit following -- Frontal lobe and executive function deficit following -- Impact on safety and function Severe aspiration risk   CHL IP TREATMENT RECOMMENDATION 09/13/2017 Treatment Recommendations Therapy as outlined in treatment plan below   Prognosis 09/13/2017 Prognosis for Safe Diet Advancement Fair Barriers to Reach Goals Severity of deficits;Motivation Barriers/Prognosis Comment -- CHL IP DIET RECOMMENDATION 09/13/2017 SLP Diet Recommendations Dysphagia 3 (Mech soft) solids;Nectar thick liquid Liquid Administration via Cup;Straw Medication Administration Crushed with puree Compensations Slow rate;Small sips/bites;Effortful swallow;Chin tuck;Clear throat after each swallow Postural Changes Seated upright at 90 degrees   CHL IP OTHER RECOMMENDATIONS 09/13/2017 Recommended Consults -- Oral Care Recommendations Oral care BID Other Recommendations --   CHL IP FOLLOW UP RECOMMENDATIONS 09/13/2017 Follow up Recommendations Skilled Nursing facility   [facility] IP FREQUENCY AND DURATION 09/13/2017 Speech Therapy Frequency (ACUTE ONLY) min 2x/week Treatment Duration 2 weeks      CHL IP ORAL PHASE 09/13/2017 Oral Phase WFL Oral - Pudding Teaspoon -- Oral - Pudding Cup -- Oral - Honey Teaspoon -- Oral - Honey Cup -- Oral - Nectar Teaspoon -- Oral - Nectar Cup -- Oral - Nectar Straw -- Oral - Thin  Teaspoon -- Oral - Thin Cup -- Oral - Thin Straw -- Oral - Puree -- Oral - Mech Soft -- Oral - Regular -- Oral - Multi-Consistency -- Oral - Pill -- Oral Phase - Comment --  CHL IP PHARYNGEAL PHASE 09/13/2017 Pharyngeal Phase Impaired Pharyngeal- Pudding Teaspoon -- Pharyngeal -- Pharyngeal- Pudding Cup -- Pharyngeal -- Pharyngeal- Honey Teaspoon -- Pharyngeal --  Pharyngeal- Honey Cup Reduced epiglottic inversion;Reduced laryngeal elevation;Pharyngeal residue - valleculae;Pharyngeal residue - pyriform;Reduced pharyngeal peristalsis Pharyngeal -- Pharyngeal- Nectar Teaspoon -- Pharyngeal -- Pharyngeal- Nectar Cup Reduced epiglottic inversion;Reduced laryngeal elevation;Pharyngeal residue - valleculae;Pharyngeal residue - pyriform;Reduced pharyngeal peristalsis;Penetration/Aspiration during swallow Pharyngeal Material enters airway, CONTACTS cords and then ejected out Pharyngeal- Nectar Straw Reduced epiglottic inversion;Reduced laryngeal elevation;Pharyngeal residue - valleculae;Pharyngeal residue - pyriform;Reduced pharyngeal peristalsis;Penetration/Aspiration during swallow Pharyngeal Material enters airway, CONTACTS cords and then ejected out Pharyngeal- Thin Teaspoon -- Pharyngeal -- Pharyngeal- Thin Cup Reduced epiglottic inversion;Reduced laryngeal elevation;Pharyngeal residue - valleculae;Pharyngeal residue - pyriform;Reduced pharyngeal peristalsis;Penetration/Aspiration during swallow;Reduced airway/laryngeal closure Pharyngeal Material enters airway, passes BELOW cords without attempt by patient to eject out (silent aspiration) Pharyngeal- Thin Straw Reduced epiglottic inversion;Reduced laryngeal elevation;Pharyngeal residue - valleculae;Pharyngeal residue - pyriform;Reduced pharyngeal peristalsis;Penetration/Aspiration during swallow;Reduced airway/laryngeal closure Pharyngeal Material enters airway, passes BELOW cords without attempt by patient to eject out (silent aspiration) Pharyngeal- Puree Reduced  epiglottic inversion;Reduced laryngeal elevation;Pharyngeal residue - valleculae;Pharyngeal residue - pyriform;Reduced pharyngeal peristalsis Pharyngeal -- Pharyngeal- Mechanical Soft -- Pharyngeal -- Pharyngeal- Regular Reduced epiglottic inversion;Reduced laryngeal elevation;Pharyngeal residue - valleculae;Pharyngeal residue - pyriform;Reduced pharyngeal peristalsis Pharyngeal -- Pharyngeal- Multi-consistency -- Pharyngeal -- Pharyngeal- Pill -- Pharyngeal -- Pharyngeal Comment --  No flowsheet data found. No flowsheet data found. DeBlois, Katherene Ponto 09/13/2017, 11:39 AM              Scheduled Meds: . colchicine  0.3 mg Oral Once per day on Mon Thu  . [START ON 09/16/2017] darbepoetin (ARANESP) injection - DIALYSIS  60 mcg Intravenous Q Fri-HD  . feeding supplement (NEPRO CARB STEADY)  237 mL Oral BID BM  . feeding supplement (PRO-STAT SUGAR FREE 64)  30 mL Oral TID  . guaiFENesin  1,200 mg Oral BID  . insulin aspart  0-5 Units Subcutaneous QHS  . insulin aspart  0-9 Units Subcutaneous TID WC  . loratadine  10 mg Oral Daily  . midodrine  10 mg Oral TID WC  . multivitamin  1 tablet Oral QHS  . pantoprazole  40 mg Oral Daily  . sodium chloride flush  3 mL Intravenous Q12H  . Warfarin - Pharmacist Dosing Inpatient   Does not apply q1800   Continuous Infusions: . sodium chloride      LOS: 11 days   Kerney Elbe, DO [facility] Pager [phone]  If 7PM-7AM, please contact night-coverage [email or web address] Password [email or web address] 09/14/2017, 6:10 PM

## 2017-09-15 DIAGNOSIS — J80 Acute respiratory distress syndrome: Secondary | ICD-10-CM | POA: Diagnosis not present

## 2017-09-15 DIAGNOSIS — R0602 Shortness of breath: Secondary | ICD-10-CM | POA: Diagnosis not present

## 2017-09-15 DIAGNOSIS — L89610 Pressure ulcer of right heel, unstageable: Secondary | ICD-10-CM | POA: Diagnosis not present

## 2017-09-15 DIAGNOSIS — I482 Chronic atrial fibrillation: Secondary | ICD-10-CM | POA: Diagnosis not present

## 2017-09-15 DIAGNOSIS — I252 Old myocardial infarction: Secondary | ICD-10-CM | POA: Diagnosis not present

## 2017-09-15 DIAGNOSIS — I132 Hypertensive heart and chronic kidney disease with heart failure and with stage 5 chronic kidney disease, or end stage renal disease: Secondary | ICD-10-CM | POA: Diagnosis not present

## 2017-09-15 DIAGNOSIS — E440 Moderate protein-calorie malnutrition: Secondary | ICD-10-CM | POA: Diagnosis not present

## 2017-09-15 DIAGNOSIS — Z7901 Long term (current) use of anticoagulants: Secondary | ICD-10-CM | POA: Diagnosis not present

## 2017-09-15 DIAGNOSIS — J189 Pneumonia, unspecified organism: Secondary | ICD-10-CM | POA: Diagnosis not present

## 2017-09-15 DIAGNOSIS — Z87820 Personal history of traumatic brain injury: Secondary | ICD-10-CM | POA: Diagnosis not present

## 2017-09-15 DIAGNOSIS — I255 Ischemic cardiomyopathy: Secondary | ICD-10-CM | POA: Diagnosis not present

## 2017-09-15 DIAGNOSIS — Z4800 Encounter for change or removal of nonsurgical wound dressing: Secondary | ICD-10-CM | POA: Diagnosis not present

## 2017-09-15 DIAGNOSIS — Z48813 Encounter for surgical aftercare following surgery on the respiratory system: Secondary | ICD-10-CM | POA: Diagnosis not present

## 2017-09-15 DIAGNOSIS — Z933 Colostomy status: Secondary | ICD-10-CM | POA: Diagnosis not present

## 2017-09-15 DIAGNOSIS — E871 Hypo-osmolality and hyponatremia: Secondary | ICD-10-CM | POA: Diagnosis not present

## 2017-09-15 DIAGNOSIS — L89890 Pressure ulcer of other site, unstageable: Secondary | ICD-10-CM | POA: Diagnosis not present

## 2017-09-15 DIAGNOSIS — Z95810 Presence of automatic (implantable) cardiac defibrillator: Secondary | ICD-10-CM | POA: Diagnosis not present

## 2017-09-15 DIAGNOSIS — I509 Heart failure, unspecified: Secondary | ICD-10-CM | POA: Diagnosis not present

## 2017-09-15 DIAGNOSIS — E119 Type 2 diabetes mellitus without complications: Secondary | ICD-10-CM | POA: Diagnosis not present

## 2017-09-15 DIAGNOSIS — N2581 Secondary hyperparathyroidism of renal origin: Secondary | ICD-10-CM | POA: Diagnosis not present

## 2017-09-15 DIAGNOSIS — N184 Chronic kidney disease, stage 4 (severe): Secondary | ICD-10-CM | POA: Diagnosis not present

## 2017-09-15 DIAGNOSIS — E7849 Other hyperlipidemia: Secondary | ICD-10-CM | POA: Diagnosis not present

## 2017-09-15 DIAGNOSIS — L89620 Pressure ulcer of left heel, unstageable: Secondary | ICD-10-CM | POA: Diagnosis not present

## 2017-09-15 DIAGNOSIS — D631 Anemia in chronic kidney disease: Secondary | ICD-10-CM | POA: Diagnosis not present

## 2017-09-15 DIAGNOSIS — Z794 Long term (current) use of insulin: Secondary | ICD-10-CM | POA: Diagnosis not present

## 2017-09-15 DIAGNOSIS — F4323 Adjustment disorder with mixed anxiety and depressed mood: Secondary | ICD-10-CM | POA: Diagnosis not present

## 2017-09-15 DIAGNOSIS — R2681 Unsteadiness on feet: Secondary | ICD-10-CM | POA: Diagnosis not present

## 2017-09-15 DIAGNOSIS — L89159 Pressure ulcer of sacral region, unspecified stage: Secondary | ICD-10-CM | POA: Diagnosis not present

## 2017-09-15 DIAGNOSIS — L89153 Pressure ulcer of sacral region, stage 3: Secondary | ICD-10-CM | POA: Diagnosis not present

## 2017-09-15 DIAGNOSIS — R945 Abnormal results of liver function studies: Secondary | ICD-10-CM | POA: Diagnosis not present

## 2017-09-15 DIAGNOSIS — E1122 Type 2 diabetes mellitus with diabetic chronic kidney disease: Secondary | ICD-10-CM | POA: Diagnosis not present

## 2017-09-15 DIAGNOSIS — L89154 Pressure ulcer of sacral region, stage 4: Secondary | ICD-10-CM | POA: Diagnosis not present

## 2017-09-15 DIAGNOSIS — Z5181 Encounter for therapeutic drug level monitoring: Secondary | ICD-10-CM | POA: Diagnosis not present

## 2017-09-15 DIAGNOSIS — D509 Iron deficiency anemia, unspecified: Secondary | ICD-10-CM | POA: Diagnosis not present

## 2017-09-15 DIAGNOSIS — N186 End stage renal disease: Secondary | ICD-10-CM | POA: Diagnosis not present

## 2017-09-15 DIAGNOSIS — Z992 Dependence on renal dialysis: Secondary | ICD-10-CM | POA: Diagnosis not present

## 2017-09-15 DIAGNOSIS — R042 Hemoptysis: Secondary | ICD-10-CM | POA: Diagnosis not present

## 2017-09-15 DIAGNOSIS — Z9889 Other specified postprocedural states: Secondary | ICD-10-CM | POA: Diagnosis not present

## 2017-09-15 DIAGNOSIS — I2510 Atherosclerotic heart disease of native coronary artery without angina pectoris: Secondary | ICD-10-CM | POA: Diagnosis not present

## 2017-09-15 DIAGNOSIS — J90 Pleural effusion, not elsewhere classified: Secondary | ICD-10-CM | POA: Diagnosis not present

## 2017-09-15 DIAGNOSIS — N185 Chronic kidney disease, stage 5: Secondary | ICD-10-CM | POA: Diagnosis not present

## 2017-09-15 DIAGNOSIS — Z658 Other specified problems related to psychosocial circumstances: Secondary | ICD-10-CM | POA: Diagnosis not present

## 2017-09-15 DIAGNOSIS — R1311 Dysphagia, oral phase: Secondary | ICD-10-CM | POA: Diagnosis not present

## 2017-09-15 DIAGNOSIS — M6281 Muscle weakness (generalized): Secondary | ICD-10-CM | POA: Diagnosis not present

## 2017-09-15 DIAGNOSIS — I259 Chronic ischemic heart disease, unspecified: Secondary | ICD-10-CM | POA: Diagnosis not present

## 2017-09-15 DIAGNOSIS — E1165 Type 2 diabetes mellitus with hyperglycemia: Secondary | ICD-10-CM | POA: Diagnosis not present

## 2017-09-15 DIAGNOSIS — R5381 Other malaise: Secondary | ICD-10-CM | POA: Diagnosis not present

## 2017-09-15 DIAGNOSIS — G4733 Obstructive sleep apnea (adult) (pediatric): Secondary | ICD-10-CM | POA: Diagnosis not present

## 2017-09-15 DIAGNOSIS — Z955 Presence of coronary angioplasty implant and graft: Secondary | ICD-10-CM | POA: Diagnosis not present

## 2017-09-15 DIAGNOSIS — B351 Tinea unguium: Secondary | ICD-10-CM | POA: Diagnosis not present

## 2017-09-15 DIAGNOSIS — D518 Other vitamin B12 deficiency anemias: Secondary | ICD-10-CM | POA: Diagnosis not present

## 2017-09-15 DIAGNOSIS — M109 Gout, unspecified: Secondary | ICD-10-CM | POA: Diagnosis not present

## 2017-09-15 DIAGNOSIS — Z79899 Other long term (current) drug therapy: Secondary | ICD-10-CM | POA: Diagnosis not present

## 2017-09-15 DIAGNOSIS — K7460 Unspecified cirrhosis of liver: Secondary | ICD-10-CM | POA: Diagnosis not present

## 2017-09-15 DIAGNOSIS — I120 Hypertensive chronic kidney disease with stage 5 chronic kidney disease or end stage renal disease: Secondary | ICD-10-CM | POA: Diagnosis not present

## 2017-09-15 DIAGNOSIS — L97521 Non-pressure chronic ulcer of other part of left foot limited to breakdown of skin: Secondary | ICD-10-CM | POA: Diagnosis not present

## 2017-09-15 DIAGNOSIS — R1313 Dysphagia, pharyngeal phase: Secondary | ICD-10-CM | POA: Diagnosis not present

## 2017-09-15 DIAGNOSIS — E1129 Type 2 diabetes mellitus with other diabetic kidney complication: Secondary | ICD-10-CM | POA: Diagnosis not present

## 2017-09-15 DIAGNOSIS — Z8673 Personal history of transient ischemic attack (TIA), and cerebral infarction without residual deficits: Secondary | ICD-10-CM | POA: Diagnosis not present

## 2017-09-15 DIAGNOSIS — Z9981 Dependence on supplemental oxygen: Secondary | ICD-10-CM | POA: Diagnosis not present

## 2017-09-15 DIAGNOSIS — Z8546 Personal history of malignant neoplasm of prostate: Secondary | ICD-10-CM | POA: Diagnosis not present

## 2017-09-15 LAB — COMPREHENSIVE METABOLIC PANEL
ALBUMIN: 1.8 g/dL — AB (ref 3.5–5.0)
ALK PHOS: 190 U/L — AB (ref 38–126)
ALT: 40 U/L (ref 17–63)
ANION GAP: 10 (ref 5–15)
AST: 105 U/L — ABNORMAL HIGH (ref 15–41)
BILIRUBIN TOTAL: 3.1 mg/dL — AB (ref 0.3–1.2)
BUN: 25 mg/dL — AB (ref 6–20)
CALCIUM: 8 mg/dL — AB (ref 8.9–10.3)
CO2: 27 mmol/L (ref 22–32)
Chloride: 95 mmol/L — ABNORMAL LOW (ref 101–111)
Creatinine, Ser: 2.65 mg/dL — ABNORMAL HIGH (ref 0.61–1.24)
GFR calc Af Amer: 28 mL/min — ABNORMAL LOW (ref >60)
GFR, EST NON AFRICAN AMERICAN: 24 mL/min — AB (ref >60)
GLUCOSE: 142 mg/dL — AB (ref 65–99)
POTASSIUM: 3.8 mmol/L (ref 3.5–5.1)
Sodium: 132 mmol/L — ABNORMAL LOW (ref 135–145)
TOTAL PROTEIN: 6.4 g/dL — AB (ref 6.5–8.1)

## 2017-09-15 LAB — CBC WITH DIFFERENTIAL/PLATELET
BASOS ABS: 0 10*3/uL (ref 0.0–0.1)
BASOS PCT: 0 %
EOS PCT: 1 %
Eosinophils Absolute: 0.1 10*3/uL (ref 0.0–0.7)
HEMATOCRIT: 32.5 % — AB (ref 39.0–52.0)
HEMOGLOBIN: 10.3 g/dL — AB (ref 13.0–17.0)
LYMPHS ABS: 0.4 10*3/uL — AB (ref 0.7–4.0)
Lymphocytes Relative: 5 %
MCH: 32.2 pg (ref 26.0–34.0)
MCHC: 31.7 g/dL (ref 30.0–36.0)
MCV: 101.6 fL — AB (ref 78.0–100.0)
MONOS PCT: 9 %
Monocytes Absolute: 0.8 10*3/uL (ref 0.1–1.0)
NEUTROS ABS: 7.1 10*3/uL (ref 1.7–7.7)
Neutrophils Relative %: 85 %
Platelets: 68 10*3/uL — ABNORMAL LOW (ref 150–400)
RBC: 3.2 MIL/uL — ABNORMAL LOW (ref 4.22–5.81)
RDW: 22.2 % — AB (ref 11.5–15.5)
WBC: 8.4 10*3/uL (ref 4.0–10.5)

## 2017-09-15 LAB — GLUCOSE, CAPILLARY
GLUCOSE-CAPILLARY: 136 mg/dL — AB (ref 65–99)
Glucose-Capillary: 150 mg/dL — ABNORMAL HIGH (ref 65–99)

## 2017-09-15 LAB — PROTIME-INR
INR: 2.57
Prothrombin Time: 27.4 seconds — ABNORMAL HIGH (ref 11.4–15.2)

## 2017-09-15 LAB — MAGNESIUM: MAGNESIUM: 1.9 mg/dL (ref 1.7–2.4)

## 2017-09-15 LAB — PHOSPHORUS: Phosphorus: 3.6 mg/dL (ref 2.5–4.6)

## 2017-09-15 MED ORDER — NEPRO/CARBSTEADY PO LIQD: 237.0000 mL | Freq: Two times a day (BID) | ORAL | 0 refills | Status: DC

## 2017-09-15 MED ORDER — DARBEPOETIN ALFA 60 MCG/0.3ML IJ SOSY: 60.0000 ug | PREFILLED_SYRINGE | INTRAMUSCULAR | Status: DC

## 2017-09-15 MED ORDER — WARFARIN 0.5 MG HALF TABLET
0.5000 mg | ORAL_TABLET | Freq: Once | ORAL | Status: DC
  Filled 2017-09-15: qty 1

## 2017-09-15 MED ORDER — HYDROCODONE-ACETAMINOPHEN 7.5-325 MG PO TABS: 1.0000 | ORAL_TABLET | ORAL | 0 refills | Status: DC | PRN

## 2017-09-15 MED ORDER — COLCHICINE 0.6 MG PO TABS: 0.3000 mg | ORAL_TABLET | ORAL | 0 refills | Status: DC

## 2017-09-15 MED ORDER — WARFARIN SODIUM 1 MG PO TABS: 0.5000 mg | ORAL_TABLET | Freq: Once | ORAL | 0 refills | Status: DC

## 2017-09-15 MED ORDER — PRO-STAT SUGAR FREE PO LIQD: 30.0000 mL | Freq: Three times a day (TID) | ORAL | 0 refills | Status: DC

## 2017-09-15 MED ORDER — CEPHALEXIN 250 MG PO CAPS: 250.0000 mg | ORAL_CAPSULE | Freq: Two times a day (BID) | ORAL | 11 refills | Status: AC

## 2017-09-15 MED ORDER — GUAIFENESIN ER 600 MG PO TB12: 1200.0000 mg | ORAL_TABLET | Freq: Two times a day (BID) | ORAL | 0 refills | Status: DC

## 2017-09-15 NOTE — Discharge Summary (Addendum)
Physician Discharge Summary  [name] CLE:[id number] DOB: 10-Nov-1956 DOA: 09/03/2017  PCP: Redmond School, MD  Admit date: 09/03/2017 Discharge date: 09/15/2017  Admitted From: Home Disposition: SNF  Recommendations for Outpatient Follow-up:  1. Follow up with PCP in 1-2 weeks 2. Follow up with Dialysis at [place] and can start 10/13 at 11:15 am; Follow up with Nephrology as an outpatient; Patient to Dialyze in chair as per Nephro 3. Follow up with Gastroenterology Dr. Gala Romney as an outpatient  4. Follow up with Cardiology as an outpatient 5. Follow up with Infectious Diseases Dr. Megan Salon as an outpatient  In 3 months 6. Repeat PT INR in AM and adjust Coumadin Dose as Necessary 7. Please obtain CMP/CBC, Mag, Phos in one week 8. Please follow up on the following pending results:  Home Health: No Equipment/Devices: None recommended by PT    Discharge Condition: Stable  CODE STATUS: FULL CODE Diet recommendation: Dysphagia 3 Mechanical Soft Nectar Thick Liquid  Brief/Interim Summary: Robert C Ashleyis a 61 y.o.male,w Dm2, SAH, PAfib, cirrhosis, Mild MR, Mild AR, CAD, CHF (EF 20%) ESRD on H, apparently c/o cough with yellow sputum and mild hemoptysis. Hewas found to have health care associated pneumonia. Admitted for the management of HCAP and is s/p Abx treatment. Found to have a Pleural Effusion that was drained. Has a large Sacral Decubitus that WOC has been working with and now has a Wound Vac. Was in the process of having Dialysis Centers changed and now accepted at [facility]. Was deemed medically stable to D/C to SNF and follow up with Nephrology as an outpatient with Dialysis at [facility] on TTS, and will need to have INR closely watched.    Discharge Diagnoses:  Principal Problem:   HCAP (healthcare-associated pneumonia) Active Problems:   DM type 2, uncontrolled, with renal complications (HCC)   Abnormal LFTs   Hyponatremia   Hemoptysis   Status post  thoracentesis   Colostomy in place [facility])   ESRD on dialysis ([place])   Malnutrition of moderate degree  HCAP/Aspiration Pneumonia -Patient treated with vancomycin and cefepime.  -Blood cultures without growth. No sputum culture sent on admission. Concern for worsening infiltrate, however, patient has been afebrile and with a normal WBC. Completed antibiotic course. -sputum gram stain/culture (no sample sent) -Continue to Monitor in outpatient Setting   Dysphagia -SLP recommendations: barium swallow 10/9 -Continue dysphagia 3 diet with Nectar Thick Liquids  Pleural Effusion -Appears to likely be transudate. Possibly from heart failure. -Asymptomatic. -Had Thoracentesis s/p 1.8 Liters on 9/30 and re-occurred on 09/08/17  -Repeat CXR in 3-4 weeks   Sacral Decubitus Ulcer -Unstageable. Afebrile. -S/pHydrotherapy -Wound care recommendations: plan for wound vac and was applied and will need to continue   Diabetes mellitus -Continued SSI while Hospitalized -Monitor at Facility with Diet  -HbA1c was 5.0 on 09/03/17  Hypotension -Chronic. -Continue Midodrine 10 mg po TIDwm  Hemoptysis -Resolved. Secondary to HCAP and frequent coughing.  ESRD on Hemodialysis -Appreciate Nephrology Recommendations -C/w Rena-Vit -[facility] to [facility] for Dialysis -C/w Dialysis TTSat  Thrombocytopenia -Stable. Chronic. -Platelet Count is now 68  Leukocytosis -Improved. WBC went from 15.9 -> 8.4 -Continue to Monitor for S/Sx of Infection -Repeat CBC at SNF  Paroxysmal Atrial Fibrillation -INR Therapeutic. Improving. -Continue Coumadin 0.5 mg po Daily and adjust dose as necessary based on INR -Repeat PT-INR in AM at SNF a  Cirrhosis/Hyperbilirubinemia/Abnormal LFTs -Bilirubin essentially the same at 3.1 and AST at 105 -  Hepatitis was non-reactive -Likely made worse by hypotension. Outpatient follow-up with Gastroenterology for Cirrhosis follow up    History of Rectal Carcinoma -S/p LLQ diverting colostomy. Stable.  Right great toe ulcer -Cold foot. Pulses palpable. Previous ABI significant for elevated ABIs secondary to early calcification. Recurrent ABI similar.  Hyponatremia -Chronic. Sodium was now 132 -Asymptomatic. Patient with cirrhosis, ESRD and heart failure -C/w Dialysis   Chronic Systolic Heart Failure -EF of 15-20% with diffuse hypokinesis and apical akinesis. -C/w Dialysis for volume maintenance -Declined palliative care consult  Moderate Malnutrition in Chronic Illness -C/w Nephro Shake po BID and Prostat 30 mL TID -C/w Magic Cup TIDwm -Appreciated Nutritionist Consultation and Reccs  Hx of MSSA Bacteremia -C/w Keflex indefinitely per ID note -Restarted Keflex at D/C   Discharge Instructions  Discharge Instructions    Call MD for:  difficulty breathing, headache or visual disturbances    Complete by:  As directed    Call MD for:  extreme fatigue    Complete by:  As directed    Call MD for:  hives    Complete by:  As directed    Call MD for:  persistant dizziness or light-headedness    Complete by:  As directed    Call MD for:  persistant nausea and vomiting    Complete by:  As directed    Call MD for:  redness, tenderness, or signs of infection (pain, swelling, redness, odor or green/yellow discharge around incision site)    Complete by:  As directed    Call MD for:  severe uncontrolled pain    Complete by:  As directed    Call MD for:  temperature >100.4    Complete by:  As directed    Diet - low sodium heart healthy    Complete by:  As directed    Discharge instructions    Complete by:  As directed    Follow up Care at [facility]. Take all medications as prescribed. Repeat Bloodwork, PT-INR in AM. If symptoms change or worsen please return to the ED for evaluation.   Discharge wound care:    Complete by:  As directed    Started NPWT to the sacral ulceration, 1pc of  black foam used the fill the wound bed, skin to the right hip protected with VAC drape and 1pc of black foam used for bridge to keep patient from lying on theTRAC pad or tubing. Sealed at 156mmHG, patient tolerated, however he received IV pain meds during the dressing change.   Will need SNF that can change NPWT dressings 3x wk, currently T/Th/Sat.   [place] Nurse ostomy follow up   Increase activity slowly    Complete by:  As directed      Allergies as of 09/15/2017   No Known Allergies     Medication List    STOP taking these medications   cephALEXin 250 MG capsule Commonly known as:  KEFLEX   insulin aspart 100 UNIT/ML FlexPen Commonly known as:  NOVOLOG   Insulin Glargine 100 UNIT/ML Solostar Pen Commonly known as:  LANTUS   ketorolac 10 MG tablet Commonly known as:  TORADOL     TAKE these medications   colchicine 0.6 MG tablet Take 0.5 tablets (0.3 mg total) by mouth 2 (two) times a week. What changed:  when to take this   collagenase ointment Commonly known as:  SANTYL Apply topically daily. Applied to affected areas directed   feeding supplement (NEPRO CARB STEADY)  Liqd Take 237 mLs by mouth 2 (two) times daily between meals.   feeding supplement (PRO-STAT SUGAR FREE 64) Liqd Take 30 mLs by mouth 3 (three) times daily.   glucose blood test strip Use to test blood sugar 3 times daily   guaiFENesin 600 MG 12 hr tablet Commonly known as:  MUCINEX Take 2 tablets (1,200 mg total) by mouth 2 (two) times daily.   HYDROcodone-acetaminophen 7.5-325 MG tablet Commonly known as:  NORCO Take 1-2 tablets by mouth every 4 (four) hours as needed for moderate pain.   loratadine 10 MG tablet Commonly known as:  CLARITIN Take 1 tablet (10 mg total) by mouth daily.   midodrine 10 MG tablet Commonly known as:  PROAMATINE Take 1 tablet (10 mg total) by mouth 3 (three) times daily with meals.   multivitamin Tabs tablet Take 1 tablet by mouth at bedtime.   nitroGLYCERIN  0.4 MG SL tablet Commonly known as:  NITROSTAT Place 1 tablet (0.4 mg total) under the tongue every 5 (five) minutes as needed for chest pain.   pantoprazole 40 MG tablet Commonly known as:  PROTONIX Take 1 tablet (40 mg total) by mouth daily.   polyethylene glycol packet Commonly known as:  MIRALAX / GLYCOLAX Take 17 g by mouth daily as needed for mild constipation.   PROAIR RESPICLICK 283 (90 Base) MCG/ACT Aepb Generic drug:  Albuterol Sulfate Inhale 1 puff into the lungs every 6 (six) hours as needed (for breathing).   sucroferric oxyhydroxide 500 MG chewable tablet Commonly known as:  VELPHORO Chew 1 tablet (500 mg total) by mouth 3 (three) times daily with meals.   warfarin 1 MG tablet Commonly known as:  COUMADIN Take 0.5 tablets (0.5 mg total) by mouth one time only at 6 PM. What changed:  how much to take  when to take this            Discharge Care Instructions        Start     Ordered   09/15/17 0000  Discharge wound care:    Comments:  Started NPWT to the sacral ulceration, 1pc of black foam used the fill the wound bed, skin to the right hip protected with VAC drape and 1pc of black foam used for bridge to keep patient from lying on theTRAC pad or tubing. Sealed at 185mmHG, patient tolerated, however he received IV pain meds during the dressing change.   Will need SNF that can change NPWT dressings 3x wk, currently T/Th/Sat.   [place] Nurse ostomy follow up   09/15/17 1113     Follow-up Information    Redmond School, MD. Call.   Specialty:  Internal Medicine Why:  Follow up 1 week after D/C from SNF Contact information: [address] [place] [address] [phone]          No Known Allergies  Consultations:  Nephrology  [place] Nurse  Procedures/Studies: Dg Chest 1 View  Result Date: 09/04/2017 CLINICAL DATA:  Post right-sided thoracentesis EXAM: CHEST 1 VIEW COMPARISON:  09/03/2017; chest CT - 09/03/2017 FINDINGS: Grossly  unchanged enlarged cardiac silhouette and mediastinal contours. Stable position of support apparatus. Interval reduction / near resolution of right-sided pleural effusion post thoracentesis. No pneumothorax. A small amount of fluid is seen tracking within the right major fissure. Overall improved aeration of the right lung base. Minimal bibasilar opacities favored to represent atelectasis. No new focal airspace opacities. No evidence of edema. No acute osseus abnormalities. Degenerative change of the left glenohumeral joint  is suspected though incompletely evaluated. IMPRESSION: 1. Interval reduction / near resolution of right-sided pleural effusion post thoracentesis. No pneumothorax. 2. Improved aeration of lung bases with persistent bibasilar opacities, likely atelectasis. Electronically Signed   By: Sandi Mariscal M.D.   On: 09/04/2017 14:16   Dg Chest 2 View  Result Date: 09/08/2017 CLINICAL DATA:  Pleural effusion EXAM: CHEST  2 VIEW COMPARISON:  09/06/2017 FINDINGS: Small to moderate layering right pleural effusion. Associated right lower lobe opacity, likely atelectasis. Left lung is clear. No pneumothorax. Cardiomegaly.  Left chest ICD. Mild degenerative changes of the visualized thoracolumbar spine. IMPRESSION: Small to moderate layering right pleural effusion. Associated right lower lobe opacity, likely atelectasis. Electronically Signed   By: Julian Hy M.D.   On: 09/08/2017 07:46   Dg Chest 2 View  Result Date: 09/06/2017 CLINICAL DATA:  61 year old male with bloody sputum and possible infection. Shortness breath. Subsequent encounter. EXAM: CHEST  2 VIEW COMPARISON:  09/04/2017 chest x-ray.  09/03/2017 chest CT. FINDINGS: AICD/biventricular pacer in place.  Cardiomegaly. Appearance of worsening airspace disease right lung may represent progressive mild pulmonary edema versus shifting of posteriorly layering pleural effusion. Right middle lobe consolidation as noted on recent CT. IMPRESSION:  Appearance of worsening airspace disease right lung may represent progressive mild pulmonary edema versus shifting of posteriorly layering pleural effusion. Right middle lobe consolidation as noted on recent CT. Cardiomegaly. Electronically Signed   By: Genia Del M.D.   On: 09/06/2017 14:30   Dg Chest 2 View  Result Date: 09/03/2017 CLINICAL DATA:  Hemoptysis today EXAM: CHEST  2 VIEW COMPARISON:  07/24/2017 FINDINGS: Stable cardiomegaly with ICD device projecting over the left hemithorax and leads in the right atrium, coronary sinus and right ventricle. Hazy opacity at the right lung base is noted suspicious for pneumonia and/or layering right effusion. Degenerate changes are seen about both glenohumeral and AC joints. IMPRESSION: 1. Hazy densities at the right lung base suspicious for pneumonia and/or layering effusion. 2. Stable cardiomegaly with ICD device in place. Electronically Signed   By: Bhargava Royalty M.D.   On: 09/03/2017 03:26   Ct Chest Wo Contrast  Result Date: 09/03/2017 CLINICAL DATA:  61 year old male with hemoptysis. EXAM: CT CHEST WITHOUT CONTRAST TECHNIQUE: Multidetector CT imaging of the chest was performed following the standard protocol without IV contrast. COMPARISON:  08/31/2017 and prior radiographs. 07/10/2017 abdominal CT. 05/26/2009 chest CT FINDINGS: Cardiovascular: Cardiomegaly, heavy coronary artery calcifications and pacemaker/ICD again noted. Thoracic aortic calcifications noted without aneurysm. No pericardial effusion. Mediastinum/Nodes: Multiple shotty mediastinal lymph nodes noted. No mediastinal mass. The trachea, esophagus and thyroid are unremarkable. Lungs/Pleura: Airspace disease/ consolidation within the right middle lobe is compatible with pneumonia or hemorrhage. Moderate to large right pleural effusion, moderate left pleural effusion are again noted, increased on the left since 07/10/2017. Moderate bilateral lower lung atelectasis noted. No evidence of  pneumothorax. Upper Abdomen: Ascites in the upper abdomen again noted. Musculoskeletal: No acute or suspicious bony abnormalities. IMPRESSION: 1. Right middle lobe airspace disease/consolidation, favor pneumonia over hemorrhage. 2. Moderate to large right pleural effusion and moderate left pleural effusion. Left pleural effusion has increased since 07/10/2017. 3. Cardiomegaly and coronary artery disease 4. Ascites again identified 5.  Aortic Atherosclerosis (ICD10-I70.0). Electronically Signed   By: Margarette Canada M.D.   On: 09/03/2017 15:58   Dg Swallowing Func-speech Pathology  Result Date: 09/13/2017 Completed by Aaron Edelman, SLP Student Supervised and reviewed by Herbie Baltimore MA CCC-SLP Objective Swallowing Evaluation: Type of Study: MBS-Modified Barium  Swallow Study Patient Details Name: SANTE BIEDERMANN MRN: [id number] Date of Birth: Aug 09, 1956 Today's Date: 09/13/2017 Time: SLP Start Time (ACUTE ONLY): 1009-SLP Stop Time (ACUTE ONLY): 1039 SLP Time Calculation (min) (ACUTE ONLY): 30 min Past Medical History: Past Medical History: Diagnosis Date . Adenocarcinoma of rectum ([place])   a. 2008-colostomy . AICD (automatic cardioverter/defibrillator) present 2002  BI V ICD . Anemia  . CAD (coronary artery disease)   a. [facility] LAD 2001 [facility] b. PTCA/atherectomy ramus and BMS to LAD 2009 . CHF (congestive heart failure) ([place])  . Cholelithiasis 06/2015 . Chronic kidney disease, stage IV (severe) ([place])  . Chronic systolic heart failure ([place])  . Cirrhosis ([place])  . Colostomy in place [facility])  . Dizziness   a. chronic. Admission for this 07/18/2014 . DM type 2, uncontrolled, with renal complications ([place])  . Dysrhythmia  . Essential hypertension, benign  . GERD (gastroesophageal reflux disease)  . HCAP (healthcare-associated pneumonia) 07/21/2015 . Hematuria  . History of blood transfusion   "I've had 2 units so far this year" (09/27/2015) . HLD (hyperlipidemia)  . Ischemic cardiomyopathy   EF 18% by nuclear study 2016,  multiple myocardial infarctions in past   . Myocardial infarction ([place]) 2001 . Obesity  . Orthostatic hypotension  . OSA on CPAP  . Paroxysmal atrial fibrillation (HCC)   a. on amiodarone, digoxin and Eliquis . PONV (postoperative nausea and vomiting)  . Presence of permanent cardiac pacemaker  . Prostate cancer ([place])   a. s/p seed implants with chemo and radiation . SAH (subarachnoid hemorrhage) ([place])   post-traumatic (fall) [facility] 12/2014 . TIA (transient ischemic attack)  Past Surgical History: Past Surgical History: Procedure Laterality Date . Abdominal and Perineal Resection of Rectum with Total Mesorectal Excision  10/04/2007 . AV FISTULA PLACEMENT Right 09/16/2015  Procedure: ARTERIOVENOUS (AV) FISTULA CREATION - BRACHIOCEPHALIC;  Surgeon: Elam Dutch, MD;  Location: [place];  Service: Vascular;  Laterality: Right; . BI-VENTRICULAR IMPLANTABLE CARDIOVERTER DEFIBRILLATOR  (CRT-D)  2009 . CARDIAC CATHETERIZATION  08/2001; 2009  ; [name] 07/10/2013 . CARDIAC DEFIBRILLATOR PLACEMENT  2002 . CARDIAC DEFIBRILLATOR PLACEMENT  2009  Upgraded to a BiV ICD . COLONOSCOPY  09/14/2011  Dr. Gala Romney: via colostomy, Single pedunculated benign inflammatory polyp. Due for surveillance Oct 2015 . COLONOSCOPY N/A 07/02/2014  Procedure: COLONOSCOPY;  Surgeon: Daneil Dolin, MD;  [facility];  Service: Endoscopy;  Laterality: N/A;  7:30 / COLONOSCOPY THRU COLOSTOMY . COLONOSCOPY N/A 12/11/2014  Dr. Gala Romney via colostomy. Normal. Repeat in 2021.  Marland Kitchen COLONOSCOPY N/A 08/24/2015  Dr. Havery Moros: diminutive polyp not removed, stoma site of bleeding . COLOSTOMY  2008 . CORONARY ANGIOPLASTY WITH STENT PLACEMENT  2001; ~ 2006  "1 + 1"  . ELECTROPHYSIOLOGIC STUDY N/A 08/28/2015  Procedure: AV Node Ablation;  Surgeon: Will Meredith Leeds, MD;  Location: [place] CV LAB;  Service: Cardiovascular;  Laterality: N/A; . EP IMPLANTABLE DEVICE N/A 04/10/2015  Procedure: Ppm/Biv Ppm Generator Changeout;  Surgeon: Evans Lance, MD;  Location: [place]  INVASIVE CV LAB CUPID;  Service: Cardiovascular;  Laterality: N/A; . ERCP N/A 06/11/2016  Procedure: ENDOSCOPIC RETROGRADE CHOLANGIOPANCREATOGRAPHY (ERCP);  Surgeon: Teena Irani, MD;  Location: [facility] ENDOSCOPY;  Service: Endoscopy;  Laterality: N/A; . ERCP N/A 01/26/2017  Procedure: ENDOSCOPIC RETROGRADE CHOLANGIOPANCREATOGRAPHY (ERCP);  Surgeon: Teena Irani, MD;  Location: [facility] ENDOSCOPY;  Service: Endoscopy;  Laterality: N/A; . ERCP  06/2016  Dr. Amedeo Plenty: duodenal fistula, dilated bile duct but no obvious stones, s/p sphincterotomy and stent placement . ERCP  01/2017  Dr. Amedeo Plenty: CBD stones, s/p sphincterotomy, balloon extraction, stent removal . ESOPHAGOGASTRODUODENOSCOPY N/A 07/02/2014  Procedure: ESOPHAGOGASTRODUODENOSCOPY (EGD);  Surgeon: Daneil Dolin, MD;  [facility];  Service: Endoscopy;  Laterality: N/A;  7:30 . ESOPHAGOGASTRODUODENOSCOPY N/A 12/11/2014  GUR:KYHCWC EGD . ESOPHAGOGASTRODUODENOSCOPY (EGD) WITH PROPOFOL N/A 04/18/2017  Procedure: ESOPHAGOGASTRODUODENOSCOPY (EGD) WITH PROPOFOL;  Surgeon: Daneil Dolin, MD;  [facility];  Service: Endoscopy;  Laterality: N/A;  2:30pm . GASTROINTESTINAL STENT REMOVAL N/A 01/26/2017  Procedure: GASTROINTESTINAL STENT REMOVAL;  Surgeon: Teena Irani, MD;  Location: [place];  Service: Endoscopy;  Laterality: N/A; . GIVENS CAPSULE STUDY N/A 07/23/2015  Dr. Michail Sermon: small non-bleeding AVM, mild gastritis . IR FLUORO GUIDE CV LINE RIGHT  07/27/2017 . IR REMOVAL TUN CV CATH W/O FL  08/12/2017 . IR US GUIDE VASC ACCESS RIGHT  07/27/2017 . LEFT HEART CATHETERIZATION WITH CORONARY ANGIOGRAM N/A 07/13/2013  Procedure: LEFT HEART CATHETERIZATION WITH CORONARY ANGIOGRAM;  Surgeon: Lorretta Harp, MD;  Location: [facility] CATH LAB;  Service: Cardiovascular;  Laterality: N/A; . Venia Minks DILATION N/A 07/02/2014  Procedure: Venia Minks DILATION;  Surgeon: Daneil Dolin, MD;  [facility];  Service: Endoscopy;  Laterality: N/A;  7:30 . MALONEY DILATION N/A 04/18/2017   Procedure: Venia Minks DILATION;  Surgeon: Daneil Dolin, MD;  [facility];  Service: Endoscopy;  Laterality: N/A; . PORTACATH PLACEMENT  06/2007  "removed ~ 1 yr later" . RIGHT HEART CATHETERIZATION N/A 02/24/2015  Procedure: RIGHT HEART CATH;  Surgeon: Jolaine Artist, MD;  Location: [facility] CATH LAB;  Service: Cardiovascular;  Laterality: N/A; . SAVORY DILATION N/A 07/02/2014  Procedure: SAVORY DILATION;  Surgeon: Daneil Dolin, MD;  [facility];  Service: Endoscopy;  Laterality: N/A;  7:30 HPI: Robert C Ashleyis a 61 y.o.male,w Dm2, SAH, Pafib, Mild MR, Mild AR, CAD, CHF (EF 20%) ESRD on HD (M,W, F), apparently c/o cough with yellow sputum and mild hemoptysis. Hewas found to have health care associated pneumonia. Admitted for the management of hcap. Most recent chest xray 10/4  (2 view) shows small to moderate layering right pleural effusion and associated right lower lobe obacity, likely atelectasis.   Bedside swallow evaluation completed 10/4 with findings of normal oropharyngeal function.  New orders 10/7 for repeat swallow assessment due to nursing observations of increased and consistent coughing with consumption of liquids  Subjective: drowsy Assessment / Plan / Recommendation CHL IP CLINICAL IMPRESSIONS 09/13/2017 Clinical Impression Pt presents with moderate-severe pharyngeal dysphagia characterized by reduced pharyngeal contraction, decreased epiglottic deflection and reduced hyolaryngeal excursion resulting in poor airway protection and vallecular residue. Silent aspiration and penetration noted with thin liquid; supraglottic swallow was unsuccessful at protecting airway during the swallow. Cued pt for effective throat clear with success. Pooling in the pyriform sinus post swallow 2/2 reduced pharyngeal contraction as well as spillage from vallecular residue; effortful swallow successful at reducing residue. Improvement in airway protection and reduction of residue noted using  chin tuck with nectar-thick liquid. Recommend Dys 3 (mech soft) diet with nectar-thick liquid and total assist for cueing compensatory strategies (chin tuck and effortful swallow with throat clear); meds crushed in puree. [place] water protocol to be considered for pt comfort and to reduce the risk of dehydration. High risk of aspiration remains across all consistencies. Will continue to monitor for use of compensatory strategies, diet tolerance and begin RMST.  SLP Visit Diagnosis Dysphagia, pharyngeal phase (R13.13) Attention and concentration deficit following -- Frontal lobe and executive function deficit following --  Impact on safety and function Severe aspiration risk   CHL IP TREATMENT RECOMMENDATION 09/13/2017 Treatment Recommendations Therapy as outlined in treatment plan below   Prognosis 09/13/2017 Prognosis for Safe Diet Advancement Fair Barriers to Reach Goals Severity of deficits;Motivation Barriers/Prognosis Comment -- CHL IP DIET RECOMMENDATION 09/13/2017 SLP Diet Recommendations Dysphagia 3 (Mech soft) solids;Nectar thick liquid Liquid Administration via Cup;Straw Medication Administration Crushed with puree Compensations Slow rate;Small sips/bites;Effortful swallow;Chin tuck;Clear throat after each swallow Postural Changes Seated upright at 90 degrees   CHL IP OTHER RECOMMENDATIONS 09/13/2017 Recommended Consults -- Oral Care Recommendations Oral care BID Other Recommendations --   CHL IP FOLLOW UP RECOMMENDATIONS 09/13/2017 Follow up Recommendations Skilled Nursing facility   [facility] IP FREQUENCY AND DURATION 09/13/2017 Speech Therapy Frequency (ACUTE ONLY) min 2x/week Treatment Duration 2 weeks      CHL IP ORAL PHASE 09/13/2017 Oral Phase WFL Oral - Pudding Teaspoon -- Oral - Pudding Cup -- Oral - Honey Teaspoon -- Oral - Honey Cup -- Oral - Nectar Teaspoon -- Oral - Nectar Cup -- Oral - Nectar Straw -- Oral - Thin Teaspoon -- Oral - Thin Cup -- Oral - Thin Straw -- Oral - Puree -- Oral - Mech Soft -- Oral  - Regular -- Oral - Multi-Consistency -- Oral - Pill -- Oral Phase - Comment --  CHL IP PHARYNGEAL PHASE 09/13/2017 Pharyngeal Phase Impaired Pharyngeal- Pudding Teaspoon -- Pharyngeal -- Pharyngeal- Pudding Cup -- Pharyngeal -- Pharyngeal- Honey Teaspoon -- Pharyngeal -- Pharyngeal- Honey Cup Reduced epiglottic inversion;Reduced laryngeal elevation;Pharyngeal residue - valleculae;Pharyngeal residue - pyriform;Reduced pharyngeal peristalsis Pharyngeal -- Pharyngeal- Nectar Teaspoon -- Pharyngeal -- Pharyngeal- Nectar Cup Reduced epiglottic inversion;Reduced laryngeal elevation;Pharyngeal residue - valleculae;Pharyngeal residue - pyriform;Reduced pharyngeal peristalsis;Penetration/Aspiration during swallow Pharyngeal Material enters airway, CONTACTS cords and then ejected out Pharyngeal- Nectar Straw Reduced epiglottic inversion;Reduced laryngeal elevation;Pharyngeal residue - valleculae;Pharyngeal residue - pyriform;Reduced pharyngeal peristalsis;Penetration/Aspiration during swallow Pharyngeal Material enters airway, CONTACTS cords and then ejected out Pharyngeal- Thin Teaspoon -- Pharyngeal -- Pharyngeal- Thin Cup Reduced epiglottic inversion;Reduced laryngeal elevation;Pharyngeal residue - valleculae;Pharyngeal residue - pyriform;Reduced pharyngeal peristalsis;Penetration/Aspiration during swallow;Reduced airway/laryngeal closure Pharyngeal Material enters airway, passes BELOW cords without attempt by patient to eject out (silent aspiration) Pharyngeal- Thin Straw Reduced epiglottic inversion;Reduced laryngeal elevation;Pharyngeal residue - valleculae;Pharyngeal residue - pyriform;Reduced pharyngeal peristalsis;Penetration/Aspiration during swallow;Reduced airway/laryngeal closure Pharyngeal Material enters airway, passes BELOW cords without attempt by patient to eject out (silent aspiration) Pharyngeal- Puree Reduced epiglottic inversion;Reduced laryngeal elevation;Pharyngeal residue - valleculae;Pharyngeal  residue - pyriform;Reduced pharyngeal peristalsis Pharyngeal -- Pharyngeal- Mechanical Soft -- Pharyngeal -- Pharyngeal- Regular Reduced epiglottic inversion;Reduced laryngeal elevation;Pharyngeal residue - valleculae;Pharyngeal residue - pyriform;Reduced pharyngeal peristalsis Pharyngeal -- Pharyngeal- Multi-consistency -- Pharyngeal -- Pharyngeal- Pill -- Pharyngeal -- Pharyngeal Comment --  No flowsheet data found. No flowsheet data found. DeBlois, Katherene Ponto 09/13/2017, 11:39 AM              US Thoracentesis Asp Pleural Space W/img Guide  Result Date: 09/04/2017 INDICATION: Symptomatic right sided pleural effusion EXAM: US THORACENTESIS ASP PLEURAL SPACE W/IMG GUIDE COMPARISON:  Chest CT - 09/03/2017; chest radiograph - 09/03/2017; 07/24/2017 MEDICATIONS: None. COMPLICATIONS: None immediate. TECHNIQUE: Informed written consent was obtained from the patient after a discussion of the risks, benefits and alternatives to treatment. A timeout was performed prior to the initiation of the procedure. Initial ultrasound scanning demonstrates a large anechoic right-sided pleural effusion. The lower chest was prepped and draped in the usual sterile fashion. 1% lidocaine was used for local anesthesia. An ultrasound image was saved  for documentation purposes. An 8 Fr Safe-T-Centesis catheter was introduced. The thoracentesis was performed. The catheter was removed and a dressing was applied. The patient tolerated the procedure well without immediate post procedural complication. The patient was escorted to have an upright chest radiograph. FINDINGS: A total of approximately 1.8 liters of serous fluid was removed. Requested samples were sent to the laboratory. IMPRESSION: Successful ultrasound-guided right sided thoracentesis yielding 1.8 liters of pleural fluid. Electronically Signed   By: Sandi Mariscal M.D.   On: 09/04/2017 14:16     Subjective: Seen and examined and was doing better. No CP or SOB. States he felt  better. Ready to go to SNF.   Discharge Exam: Vitals:   09/15/17 0500 09/15/17 0941  BP: (!) 98/41 (!) 96/54  Pulse: 72 73  Resp: 18 18  Temp: (!) 97.5 F (36.4 C) 98 F (36.7 C)  SpO2: 94% 96%   Vitals:   09/14/17 1858 09/14/17 2038 09/15/17 0500 09/15/17 0941  BP: (!) 94/54 (!) 100/50 (!) 98/41 (!) 96/54  Pulse: 72 71 72 73  Resp: 20 20 18 18   Temp: (!) 97.5 F (36.4 C) 97.6 F (36.4 C) (!) 97.5 F (36.4 C) 98 F (36.7 C)  TempSrc: Axillary Oral Oral Oral  SpO2: 94% 96% 94% 96%  Weight:      Height:       General: Pt is alert, awake, not in acute distress Cardiovascular: RRR, S1/S2 +, no rubs, no gallops Respiratory: Diminished bilaterally, no wheezing, no rhonchi Abdominal: Soft, NT, ND, bowel sounds +; Has LLQ Colostomy Extremities: no edema, no cyanosis; Right Toe Ulcer; Sacral ulcer not viewed  The results of significant diagnostics from this hospitalization (including imaging, microbiology, ancillary and laboratory) are listed below for reference.    Microbiology: No results found for this or any previous visit (from the past 240 hour(s)).   Labs: BNP (last 3 results)  Recent Labs  06/13/17 1205 07/09/17 2151  BNP 1,151.0* 3,382.5*   Basic Metabolic Panel:  Recent Labs Lab 09/09/17 0654 09/10/17 0325 09/12/17 0823 09/14/17 1029 09/15/17 0236  NA 125* 128* 126* 129* 132*  K 3.9 3.8 4.5 4.3 3.8  CL 90* 91* 90* 93* 95*  CO2 24 24 22 22 27   GLUCOSE 115* 160* 136* 163* 142*  BUN 39* 28* 64* 48* 25*  CREATININE 3.60* 2.57* 3.74* 3.86* 2.65*  CALCIUM 8.5* 8.3* 8.7* 8.5* 8.0*  MG  --   --   --  2.1 1.9  PHOS 3.1  --  4.1 4.6 3.6   Liver Function Tests:  Recent Labs Lab 09/09/17 0654 09/10/17 0325 09/12/17 0823 09/14/17 1029 09/15/17 0236  AST  --  80*  --  103* 105*  ALT  --  30  --  38 40  ALKPHOS  --  173*  --  195* 190*  BILITOT  --  2.6*  --  3.1* 3.1*  PROT  --  6.8  --  6.9 6.4*  ALBUMIN 2.1* 2.0* 1.8* 1.9* 1.8*   No results for  input(s): LIPASE, AMYLASE in the last 168 hours. No results for input(s): AMMONIA in the last 168 hours. CBC:  Recent Labs Lab 09/11/17 0242 09/12/17 0313 09/13/17 0502 09/14/17 1029 09/15/17 0236  WBC 8.5 8.2 8.1 8.5 8.4  NEUTROABS  --   --   --  7.1 7.1  HGB 11.3* 11.3* 11.0* 11.0* 10.3*  HCT 34.9* 34.4* 34.1* 33.3* 32.5*  MCV 99.1 100.0 100.0 100.9* 101.6*  PLT 94*  94* 83* 70* 68*   Cardiac Enzymes: No results for input(s): CKTOTAL, CKMB, CKMBINDEX, TROPONINI in the last 168 hours. BNP: Invalid input(s): POCBNP CBG:  Recent Labs Lab 09/13/17 2042 09/14/17 0732 09/14/17 1149 09/14/17 2032 09/15/17 0727  GLUCAP 137* 137* 161* 112* 150*   D-Dimer No results for input(s): DDIMER in the last 72 hours. Hgb A1c No results for input(s): HGBA1C in the last 72 hours. Lipid Profile No results for input(s): CHOL, HDL, LDLCALC, TRIG, CHOLHDL, LDLDIRECT in the last 72 hours. Thyroid function studies No results for input(s): TSH, T4TOTAL, T3FREE, THYROIDAB in the last 72 hours.  Invalid input(s): FREET3 Anemia work up No results for input(s): VITAMINB12, FOLATE, FERRITIN, TIBC, IRON, RETICCTPCT in the last 72 hours. Urinalysis    Component Value Date/Time   COLORURINE YELLOW 07/09/2017 2215   APPEARANCEUR HAZY (A) 07/09/2017 2215   LABSPEC 1.015 07/09/2017 2215   PHURINE 5.0 07/09/2017 2215   GLUCOSEU 100 (A) 07/09/2017 2215   HGBUR TRACE (A) 07/09/2017 2215   BILIRUBINUR NEGATIVE 07/09/2017 2215   KETONESUR NEGATIVE 07/09/2017 2215   PROTEINUR NEGATIVE 07/09/2017 2215   UROBILINOGEN 1.0 08/23/2015 0322   NITRITE NEGATIVE 07/09/2017 2215   LEUKOCYTESUR TRACE (A) 07/09/2017 2215   Sepsis Labs Invalid input(s): PROCALCITONIN,  WBC,  LACTICIDVEN Microbiology No results found for this or any previous visit (from the past 240 hour(s)).  Time coordinating discharge: 35 minutes  SIGNED:  Kerney Elbe, DO [facility] 09/15/2017, 11:13 AM Pager  [phone]  If 7PM-7AM, please contact night-coverage [email or web address] Password [email or web address]

## 2017-09-15 NOTE — Progress Notes (Signed)
Physical Therapy Treatment Patient Details Name: [name] MRN: [id number] DOB: August 25, 1956 Today's Date: 09/15/2017    History of Present Illness  [name] is a 61 y.o. male with ESRD, HD initiated 07/2017 during recent [facility] admit with MSSA bacteremia/shock. PMH includes  HTN, Type 2 DM, Hx Rectal cancer (s/p colostomy 2008), Afib (on warfarin), CAD (s/p stents), ischemic CM (EF 15-20%, ICD in place), OSA on CPAP, cirrhosis, thrombocytopenia. Admitted with productive cough and hemoptysis; suspicious for pneumonia; order for PT Hydro to follow for sacral ulcer.    PT Comments    Pt was able to tolerate there ex to LE's with boots on to protect skin and progress his mobility but he declined to get OOB.  Pt was up to walk last visit, but with wound vac is maybe more reluctant.  May be leaving to SNF today and will follow up as his stay dictates.   Follow Up Recommendations  SNF     Equipment Recommendations  None recommended by PT    Recommendations for Other Services       Precautions / Restrictions Precautions Precautions: Fall Precaution Comments: wound vac on sacrum Restrictions Weight Bearing Restrictions: No    Mobility  Bed Mobility Overal bed mobility: Needs Assistance             General bed mobility comments: declined OOB  Transfers                    Ambulation/Gait                 Stairs            Wheelchair Mobility    Modified Rankin (Stroke Patients Only)       Balance                                            Cognition Arousal/Alertness: Awake/alert Behavior During Therapy: WFL for tasks assessed/performed Overall Cognitive Status: Within Functional Limits for tasks assessed                                        Exercises General Exercises - Lower Extremity Ankle Circles/Pumps: AROM;Both;15 reps Quad Sets: AROM;Both;15 reps Gluteal Sets: AROM;Both;10 reps Heel  Slides: AROM;Both;10 reps Hip ABduction/ADduction: AROM;Both;10 reps Hip Flexion/Marching: AROM;Both;10 reps    General Comments        Pertinent Vitals/Pain Pain Assessment: Faces Faces Pain Scale: No hurt    Home Living                      Prior Function            PT Goals (current goals can now be found in the care plan section) Acute Rehab PT Goals Patient Stated Goal: get home Progress towards PT goals: Progressing toward goals    Frequency    Min 3X/week      PT Plan Current plan remains appropriate    Co-evaluation              AM-PAC PT "6 Clicks" Daily Activity  Outcome Measure  Difficulty turning over in bed (including adjusting bedclothes, sheets and blankets)?: A Little Difficulty moving from lying on back to sitting on the side of the bed? : A  Lot Difficulty sitting down on and standing up from a chair with arms (e.g., wheelchair, bedside commode, etc,.)?: A Little Help needed moving to and from a bed to chair (including a wheelchair)?: A Little Help needed walking in hospital room?: A Little Help needed climbing 3-5 steps with a railing? : A Lot 6 Click Score: 16    End of Session   Activity Tolerance: Patient tolerated treatment well Patient left: in bed;with call bell/phone within reach;with family/visitor present Nurse Communication: Mobility status PT Visit Diagnosis: Unsteadiness on feet (R26.81);Muscle weakness (generalized) (M62.81);Other abnormalities of gait and mobility (R26.89);Pain     Time: 9532-0233 PT Time Calculation (min) (ACUTE ONLY): 32 min  Charges:  $Therapeutic Exercise: 23-37 mins                    G Codes:  Functional Assessment Tool Used: AM-PAC 6 Clicks Basic Mobility     Ramond Dial 09/15/2017, 12:30 PM   Mee Hives, PT MS Acute Rehab Dept. Number: [place] and [place]

## 2017-09-15 NOTE — Progress Notes (Signed)
  Speech Language Pathology Treatment: Dysphagia  Patient Details Name: [name] MRN: [id number] DOB: 1956-11-21 Today's Date: 09/15/2017 Time: 4098-1191 SLP Time Calculation (min) (ACUTE ONLY): 9 min  Assessment / Plan / Recommendation Clinical Impression  SLP provided skilled observation during breakfast meal along with Mod cues for adequate use of chin tuck strategy. No overt s/s of aspiration are observed this morning. He appears much more appropriate to continue with his current diet. Education was provided about current recommendations and rationale, as pt didn't know why he was having trouble swallowing when he "was fine for 62 years." Will continue to follow, educate, and implement EMST as able.    HPI HPI: [name] Ashleyis a 61 y.o.male,w Dm2, SAH, Pafib, Mild MR, Mild AR, CAD, CHF (EF 20%) ESRD on HD (M,W, F), apparently c/o cough with yellow sputum and mild hemoptysis. Hewas found to have health care associated pneumonia. Admitted for the management of hcap. Most recent chest xray 10/4  (2 view) shows small to moderate layering right pleural effusion and associated right lower lobe obacity, likely atelectasis.   Bedside swallow evaluation completed 10/4 with findings of normal oropharyngeal function.  New orders 10/7 for repeat swallow assessment due to nursing observations of increased and consistent coughing with consumption of liquids       SLP Plan  Continue with current plan of care       Recommendations  Diet recommendations: Dysphagia 3 (mechanical soft);Nectar-thick liquid Liquids provided via: Cup;Straw Medication Administration: Whole meds with puree Supervision: Patient able to self feed;Full supervision/cueing for compensatory strategies Compensations: Slow rate;Small sips/bites;Effortful swallow;Chin tuck;Clear throat after each swallow Postural Changes and/or Swallow Maneuvers: Seated upright 90 degrees;Upright 30-60 min after meal                Oral Care Recommendations: Oral care BID Follow up Recommendations: Skilled Nursing facility SLP Visit Diagnosis: Dysphagia, pharyngeal phase (R13.13) Plan: Continue with current plan of care       GO                [name] 09/15/2017, 9:45 AM  [name], M.A. CCC-SLP [phone]

## 2017-09-15 NOTE — Care Management Important Message (Signed)
Important Message  Patient Details  Name: [name] MRN: [id number] Date of Birth: [birth year]   Medicare Important Message Given:  Yes    [name] May 09/15/2017, 9:29 AM

## 2017-09-15 NOTE — Progress Notes (Signed)
[facility] Progress Note   Subjective:  Seen in room, eating breakfast. No CP or dyspnea. Discharge was delayed due to having to change dialysis center given located of planned SNF. Awaiting approval for this.   Objective Vitals:   09/14/17 1830 09/14/17 1858 09/14/17 2038 09/15/17 0500  BP: 91/60 (!) 94/54 (!) 100/50 (!) 98/41  Pulse: 77 72 71 72  Resp:  20 20 18   Temp:  (!) 97.5 F (36.4 C) 97.6 F (36.4 C) (!) 97.5 F (36.4 C)  TempSrc:  Axillary Oral Oral  SpO2:  94% 96% 94%  Weight:      Height:       Physical Exam General:Frail appearing male, NAD. On nasal oxygen. Heart: RRR; no murmur. Lungs: CTAB Abdomen: soft, ostomy LLQ. Extremities: 2+ edema in hips/thighs. Necrotic spot on R great toe. Dialysis Access: RUE AVF + thrill/bruit  Additional Objective Labs: Basic Metabolic Panel:  Recent Labs Lab 09/12/17 0823 09/14/17 1029 09/15/17 0236  NA 126* 129* 132*  K 4.5 4.3 3.8  CL 90* 93* 95*  CO2 22 22 27   GLUCOSE 136* 163* 142*  BUN 64* 48* 25*  CREATININE 3.74* 3.86* 2.65*  CALCIUM 8.7* 8.5* 8.0*  PHOS 4.1 4.6 3.6   Liver Function Tests:  Recent Labs Lab 09/10/17 0325 09/12/17 0823 09/14/17 1029 09/15/17 0236  AST 80*  --  103* 105*  ALT 30  --  38 40  ALKPHOS 173*  --  195* 190*  BILITOT 2.6*  --  3.1* 3.1*  PROT 6.8  --  6.9 6.4*  ALBUMIN 2.0* 1.8* 1.9* 1.8*   CBC:  Recent Labs Lab 09/11/17 0242 09/12/17 0313 09/13/17 0502 09/14/17 1029 09/15/17 0236  WBC 8.5 8.2 8.1 8.5 8.4  NEUTROABS  --   --   --  7.1 7.1  HGB 11.3* 11.3* 11.0* 11.0* 10.3*  HCT 34.9* 34.4* 34.1* 33.3* 32.5*  MCV 99.1 100.0 100.0 100.9* 101.6*  PLT 94* 94* 83* 70* 68*   Blood Culture    Component Value Date/Time   SDES PLEURAL 09/04/2017 1400   SDES PLEURAL 09/04/2017 1400   SPECREQUEST NONE 09/04/2017 1400   SPECREQUEST NONE 09/04/2017 1400   CULT NO GROWTH 5 DAYS 09/04/2017 1400   REPTSTATUS 09/09/2017 FINAL 09/04/2017 1400    REPTSTATUS 09/04/2017 FINAL 09/04/2017 1400   CBG:  Recent Labs Lab 09/13/17 2042 09/14/17 0732 09/14/17 1149 09/14/17 2032 09/15/17 0727  GLUCAP 137* 137* 161* 112* 150*   Studies/Results: Dg Swallowing Func-speech Pathology  Result Date: 09/13/2017 Completed by Aaron Edelman, SLP Student Supervised and reviewed by Herbie Baltimore MA CCC-SLP Objective Swallowing Evaluation: Type of Study: MBS-Modified Barium Swallow Study Patient Details Name: [name] MRN: [id number] Date of Birth: 1956-06-01 Today's Date: 09/13/2017 Time: SLP Start Time (ACUTE ONLY): 1009-SLP Stop Time (ACUTE ONLY): 1039 SLP Time Calculation (min) (ACUTE ONLY): 30 min Past Medical History: Past Medical History: Diagnosis Date . Adenocarcinoma of rectum ([place])   a. 2008-colostomy . AICD (automatic cardioverter/defibrillator) present 2002  BI V ICD . Anemia  . CAD (coronary artery disease)   a. BMS to LAD 2001 at [facility] b. PTCA/atherectomy ramus and BMS to LAD 2009 . CHF (congestive heart failure) ([place])  . Cholelithiasis 06/2015 . Chronic kidney disease, stage IV (severe) ([place])  . Chronic systolic heart failure ([place])  . Cirrhosis ([place])  . Colostomy in place [facility])  . Dizziness   a. chronic. Admission for this 07/18/2014 . DM type 2, uncontrolled, with renal complications ([place])  .  Dysrhythmia  . Essential hypertension, benign  . GERD (gastroesophageal reflux disease)  . HCAP (healthcare-associated pneumonia) 07/21/2015 . Hematuria  . History of blood transfusion   "I've had 2 units so far this year" (09/27/2015) . HLD (hyperlipidemia)  . Ischemic cardiomyopathy   EF 18% by nuclear study 2016, multiple myocardial infarctions in past   . Myocardial infarction ([place]) 2001 . Obesity  . Orthostatic hypotension  . OSA on CPAP  . Paroxysmal atrial fibrillation (HCC)   a. on amiodarone, digoxin and Eliquis . PONV (postoperative nausea and vomiting)  . Presence of permanent cardiac pacemaker  . Prostate cancer ([place])   a. s/p seed implants with  chemo and radiation . SAH (subarachnoid hemorrhage) ([place])   post-traumatic (fall) [facility] 12/2014 . TIA (transient ischemic attack)  Past Surgical History: Past Surgical History: Procedure Laterality Date . Abdominal and Perineal Resection of Rectum with Total Mesorectal Excision  10/04/2007 . AV FISTULA PLACEMENT Right 09/16/2015  Procedure: ARTERIOVENOUS (AV) FISTULA CREATION - BRACHIOCEPHALIC;  Surgeon: Elam Dutch, MD;  Location: [place];  Service: Vascular;  Laterality: Right; . BI-VENTRICULAR IMPLANTABLE CARDIOVERTER DEFIBRILLATOR  (CRT-D)  2009 . CARDIAC CATHETERIZATION  08/2001; 2009  ; [name] 07/10/2013 . CARDIAC DEFIBRILLATOR PLACEMENT  2002 . CARDIAC DEFIBRILLATOR PLACEMENT  2009  Upgraded to a BiV ICD . COLONOSCOPY  09/14/2011  Dr. Gala Romney: via colostomy, Single pedunculated benign inflammatory polyp. Due for surveillance Oct 2015 . COLONOSCOPY N/A 07/02/2014  Procedure: COLONOSCOPY;  Surgeon: Daneil Dolin, MD;  [facility];  Service: Endoscopy;  Laterality: N/A;  7:30 / COLONOSCOPY THRU COLOSTOMY . COLONOSCOPY N/A 12/11/2014  Dr. Gala Romney via colostomy. Normal. Repeat in 2021.  Marland Kitchen COLONOSCOPY N/A 08/24/2015  Dr. Havery Moros: diminutive polyp not removed, stoma site of bleeding . COLOSTOMY  2008 . CORONARY ANGIOPLASTY WITH STENT PLACEMENT  2001; ~ 2006  "1 + 1"  . ELECTROPHYSIOLOGIC STUDY N/A 08/28/2015  Procedure: AV Node Ablation;  Surgeon: Will Meredith Leeds, MD;  Location: [place] CV LAB;  Service: Cardiovascular;  Laterality: N/A; . EP IMPLANTABLE DEVICE N/A 04/10/2015  Procedure: Ppm/Biv Ppm Generator Changeout;  Surgeon: Evans Lance, MD;  Location: [place] INVASIVE CV LAB CUPID;  Service: Cardiovascular;  Laterality: N/A; . ERCP N/A 06/11/2016  Procedure: ENDOSCOPIC RETROGRADE CHOLANGIOPANCREATOGRAPHY (ERCP);  Surgeon: Teena Irani, MD;  Location: [facility] ENDOSCOPY;  Service: Endoscopy;  Laterality: N/A; . ERCP N/A 01/26/2017  Procedure: ENDOSCOPIC RETROGRADE CHOLANGIOPANCREATOGRAPHY (ERCP);  Surgeon: Teena Irani, MD;  Location: [facility] ENDOSCOPY;  Service: Endoscopy;  Laterality: N/A; . ERCP  06/2016  Dr. Amedeo Plenty: duodenal fistula, dilated bile duct but no obvious stones, s/p sphincterotomy and stent placement . ERCP  01/2017  Dr. Amedeo Plenty: CBD stones, s/p sphincterotomy, balloon extraction, stent removal . ESOPHAGOGASTRODUODENOSCOPY N/A 07/02/2014  Procedure: ESOPHAGOGASTRODUODENOSCOPY (EGD);  Surgeon: Daneil Dolin, MD;  [facility];  Service: Endoscopy;  Laterality: N/A;  7:30 . ESOPHAGOGASTRODUODENOSCOPY N/A 12/11/2014  TDD:[place] EGD . ESOPHAGOGASTRODUODENOSCOPY (EGD) WITH PROPOFOL N/A 04/18/2017  Procedure: ESOPHAGOGASTRODUODENOSCOPY (EGD) WITH PROPOFOL;  Surgeon: Daneil Dolin, MD;  [facility];  Service: Endoscopy;  Laterality: N/A;  2:30pm . GASTROINTESTINAL STENT REMOVAL N/A 01/26/2017  Procedure: GASTROINTESTINAL STENT REMOVAL;  Surgeon: Teena Irani, MD;  Location: [place];  Service: Endoscopy;  Laterality: N/A; . GIVENS CAPSULE STUDY N/A 07/23/2015  Dr. Michail Sermon: small non-bleeding AVM, mild gastritis . IR FLUORO GUIDE CV LINE RIGHT  07/27/2017 . IR REMOVAL TUN CV CATH W/O FL  08/12/2017 . IR US GUIDE VASC ACCESS RIGHT  07/27/2017 . LEFT HEART CATHETERIZATION WITH CORONARY ANGIOGRAM N/A 07/13/2013  Procedure: LEFT HEART CATHETERIZATION WITH CORONARY ANGIOGRAM;  Surgeon: Lorretta Harp, MD;  Location: [facility] CATH LAB;  Service: Cardiovascular;  Laterality: N/A; . Venia Minks DILATION N/A 07/02/2014  Procedure: Venia Minks DILATION;  Surgeon: Daneil Dolin, MD;  [facility];  Service: Endoscopy;  Laterality: N/A;  7:30 . MALONEY DILATION N/A 04/18/2017  Procedure: Venia Minks DILATION;  Surgeon: Daneil Dolin, MD;  [facility];  Service: Endoscopy;  Laterality: N/A; . PORTACATH PLACEMENT  06/2007  "removed ~ 1 yr later" . RIGHT HEART CATHETERIZATION N/A 02/24/2015  Procedure: RIGHT HEART CATH;  Surgeon: Jolaine Artist, MD;  Location: [facility] CATH LAB;  Service: Cardiovascular;  Laterality:  N/A; . SAVORY DILATION N/A 07/02/2014  Procedure: SAVORY DILATION;  Surgeon: Daneil Dolin, MD;  [facility];  Service: Endoscopy;  Laterality: N/A;  7:30 HPI: Deondra C Ashleyis a 61 y.o.male,w Dm2, SAH, Pafib, Mild MR, Mild AR, CAD, CHF (EF 20%) ESRD on HD (M,W, F), apparently c/o cough with yellow sputum and mild hemoptysis. Hewas found to have health care associated pneumonia. Admitted for the management of hcap. Most recent chest xray 10/4  (2 view) shows small to moderate layering right pleural effusion and associated right lower lobe obacity, likely atelectasis.   Bedside swallow evaluation completed 10/4 with findings of normal oropharyngeal function.  New orders 10/7 for repeat swallow assessment due to nursing observations of increased and consistent coughing with consumption of liquids  Subjective: drowsy Assessment / Plan / Recommendation CHL IP CLINICAL IMPRESSIONS 09/13/2017 Clinical Impression Pt presents with moderate-severe pharyngeal dysphagia characterized by reduced pharyngeal contraction, decreased epiglottic deflection and reduced hyolaryngeal excursion resulting in poor airway protection and vallecular residue. Silent aspiration and penetration noted with thin liquid; supraglottic swallow was unsuccessful at protecting airway during the swallow. Cued pt for effective throat clear with success. Pooling in the pyriform sinus post swallow 2/2 reduced pharyngeal contraction as well as spillage from vallecular residue; effortful swallow successful at reducing residue. Improvement in airway protection and reduction of residue noted using chin tuck with nectar-thick liquid. Recommend Dys 3 (mech soft) diet with nectar-thick liquid and total assist for cueing compensatory strategies (chin tuck and effortful swallow with throat clear); meds crushed in puree. [place] water protocol to be considered for pt comfort and to reduce the risk of dehydration. High risk of aspiration remains  across all consistencies. Will continue to monitor for use of compensatory strategies, diet tolerance and begin RMST.  SLP Visit Diagnosis Dysphagia, pharyngeal phase (R13.13) Attention and concentration deficit following -- Frontal lobe and executive function deficit following -- Impact on safety and function Severe aspiration risk   CHL IP TREATMENT RECOMMENDATION 09/13/2017 Treatment Recommendations Therapy as outlined in treatment plan below   Prognosis 09/13/2017 Prognosis for Safe Diet Advancement Fair Barriers to Reach Goals Severity of deficits;Motivation Barriers/Prognosis Comment -- CHL IP DIET RECOMMENDATION 09/13/2017 SLP Diet Recommendations Dysphagia 3 (Mech soft) solids;Nectar thick liquid Liquid Administration via Cup;Straw Medication Administration Crushed with puree Compensations Slow rate;Small sips/bites;Effortful swallow;Chin tuck;Clear throat after each swallow Postural Changes Seated upright at 90 degrees   CHL IP OTHER RECOMMENDATIONS 09/13/2017 Recommended Consults -- Oral Care Recommendations Oral care BID Other Recommendations --   CHL IP FOLLOW UP RECOMMENDATIONS 09/13/2017 Follow up Recommendations Skilled Nursing facility   [facility] IP FREQUENCY AND DURATION 09/13/2017 Speech Therapy Frequency (ACUTE ONLY) min 2x/week Treatment Duration 2 weeks      CHL IP  ORAL PHASE 09/13/2017 Oral Phase WFL Oral - Pudding Teaspoon -- Oral - Pudding Cup -- Oral - Honey Teaspoon -- Oral - Honey Cup -- Oral - Nectar Teaspoon -- Oral - Nectar Cup -- Oral - Nectar Straw -- Oral - Thin Teaspoon -- Oral - Thin Cup -- Oral - Thin Straw -- Oral - Puree -- Oral - Mech Soft -- Oral - Regular -- Oral - Multi-Consistency -- Oral - Pill -- Oral Phase - Comment --  CHL IP PHARYNGEAL PHASE 09/13/2017 Pharyngeal Phase Impaired Pharyngeal- Pudding Teaspoon -- Pharyngeal -- Pharyngeal- Pudding Cup -- Pharyngeal -- Pharyngeal- Honey Teaspoon -- Pharyngeal -- Pharyngeal- Honey Cup Reduced epiglottic inversion;Reduced laryngeal  elevation;Pharyngeal residue - valleculae;Pharyngeal residue - pyriform;Reduced pharyngeal peristalsis Pharyngeal -- Pharyngeal- Nectar Teaspoon -- Pharyngeal -- Pharyngeal- Nectar Cup Reduced epiglottic inversion;Reduced laryngeal elevation;Pharyngeal residue - valleculae;Pharyngeal residue - pyriform;Reduced pharyngeal peristalsis;Penetration/Aspiration during swallow Pharyngeal Material enters airway, CONTACTS cords and then ejected out Pharyngeal- Nectar Straw Reduced epiglottic inversion;Reduced laryngeal elevation;Pharyngeal residue - valleculae;Pharyngeal residue - pyriform;Reduced pharyngeal peristalsis;Penetration/Aspiration during swallow Pharyngeal Material enters airway, CONTACTS cords and then ejected out Pharyngeal- Thin Teaspoon -- Pharyngeal -- Pharyngeal- Thin Cup Reduced epiglottic inversion;Reduced laryngeal elevation;Pharyngeal residue - valleculae;Pharyngeal residue - pyriform;Reduced pharyngeal peristalsis;Penetration/Aspiration during swallow;Reduced airway/laryngeal closure Pharyngeal Material enters airway, passes BELOW cords without attempt by patient to eject out (silent aspiration) Pharyngeal- Thin Straw Reduced epiglottic inversion;Reduced laryngeal elevation;Pharyngeal residue - valleculae;Pharyngeal residue - pyriform;Reduced pharyngeal peristalsis;Penetration/Aspiration during swallow;Reduced airway/laryngeal closure Pharyngeal Material enters airway, passes BELOW cords without attempt by patient to eject out (silent aspiration) Pharyngeal- Puree Reduced epiglottic inversion;Reduced laryngeal elevation;Pharyngeal residue - valleculae;Pharyngeal residue - pyriform;Reduced pharyngeal peristalsis Pharyngeal -- Pharyngeal- Mechanical Soft -- Pharyngeal -- Pharyngeal- Regular Reduced epiglottic inversion;Reduced laryngeal elevation;Pharyngeal residue - valleculae;Pharyngeal residue - pyriform;Reduced pharyngeal peristalsis Pharyngeal -- Pharyngeal- Multi-consistency -- Pharyngeal --  Pharyngeal- Pill -- Pharyngeal -- Pharyngeal Comment --  No flowsheet data found. No flowsheet data found. DeBlois, Katherene Ponto 09/13/2017, 11:39 AM              Medications: . sodium chloride     . colchicine  0.3 mg Oral Once per day on Mon Thu  . [START ON 09/16/2017] darbepoetin (ARANESP) injection - DIALYSIS  60 mcg Intravenous Q Fri-HD  . feeding supplement (NEPRO CARB STEADY)  237 mL Oral BID BM  . feeding supplement (PRO-STAT SUGAR FREE 64)  30 mL Oral TID  . guaiFENesin  1,200 mg Oral BID  . insulin aspart  0-5 Units Subcutaneous QHS  . insulin aspart  0-9 Units Subcutaneous TID WC  . loratadine  10 mg Oral Daily  . midodrine  10 mg Oral TID WC  . multivitamin  1 tablet Oral QHS  . pantoprazole  40 mg Oral Daily  . sodium chloride flush  3 mL Intravenous Q12H  . Warfarin - Pharmacist Dosing Inpatient   Does not apply q1800    Dialysis Orders: [place]/ [facility] MWF 4h 3K/2.5 77kg Hep none R AVF - Mircera 50 ug q 2, to start 10/3 - Venofer 50/wk  Assessment/Plan: 1. HCAP: Per CT, BCx negative. Completed course ofVanc/Cefepime. S/p 1.8L thora 9/30; transudative effusion. Recurrentpleural effusion on CXR 09/08/17. 2. ESRD: Continue HD per MWF schedule, next 10/12.  3.Hypotension/volume. Will need lower EDW on d/c. Still has pitting edema abdomen and thigh. Continue attempting to lower volume as BP permits. Chronic hypotension on midodrine.  4. Anemia: Hgb 10.3.Continue Aranesp 19mcg weekly. 5. Secondary hyperparathyroidism:Corr Ca and Phos ok. Binders on hold. 6. Nutrition:  Severe PCM.Albumin 1.8. Continue Nepro/Pro-stat supplements. 7. CHF/CM: EF 15-20%, Hx AICD 8. Afib: On warfarin 9. DM: On insulin. 10.Sacral decub: Unstageable. S/p hydrotherapy/wound vac. Followed by wound care. 11. Cirrhosis 12. Hx Rectal Ca: s/p diverting colostomy. 13. Dispo: Will be discharged toSNF soon.Requested transfer of dialysis centers (to [facility]). This  was approved with plan to start [place] on discharge. Now will need [place] center since will be going to [facility] in [place] ([facility]). Approval pending.  Veneta Penton, PA-C 09/15/2017, 9:41 AM  [employer] Pager: [phone]

## 2017-09-15 NOTE — Progress Notes (Signed)
[name] to be D/C'd Nursing Home per MD order.  Discussed prescriptions and follow up appointments with the patient. Prescriptions given to patient, medication list explained in detail. Pt verbalized understanding.  Allergies as of 09/15/2017   No Known Allergies     Medication List    STOP taking these medications   insulin aspart 100 UNIT/ML FlexPen Commonly known as:  NOVOLOG   Insulin Glargine 100 UNIT/ML Solostar Pen Commonly known as:  LANTUS   ketorolac 10 MG tablet Commonly known as:  TORADOL     TAKE these medications   cephALEXin 250 MG capsule Commonly known as:  KEFLEX Take 1 capsule (250 mg total) by mouth 2 (two) times daily.   colchicine 0.6 MG tablet Take 0.5 tablets (0.3 mg total) by mouth 2 (two) times a week. What changed:  when to take this   collagenase ointment Commonly known as:  SANTYL Apply topically daily. Applied to affected areas directed   feeding supplement (NEPRO CARB STEADY) Liqd Take 237 mLs by mouth 2 (two) times daily between meals.   feeding supplement (PRO-STAT SUGAR FREE 64) Liqd Take 30 mLs by mouth 3 (three) times daily.   glucose blood test strip Use to test blood sugar 3 times daily   guaiFENesin 600 MG 12 hr tablet Commonly known as:  MUCINEX Take 2 tablets (1,200 mg total) by mouth 2 (two) times daily.   HYDROcodone-acetaminophen 7.5-325 MG tablet Commonly known as:  NORCO Take 1-2 tablets by mouth every 4 (four) hours as needed for moderate pain.   loratadine 10 MG tablet Commonly known as:  CLARITIN Take 1 tablet (10 mg total) by mouth daily.   midodrine 10 MG tablet Commonly known as:  PROAMATINE Take 1 tablet (10 mg total) by mouth 3 (three) times daily with meals.   multivitamin Tabs tablet Take 1 tablet by mouth at bedtime.   nitroGLYCERIN 0.4 MG SL tablet Commonly known as:  NITROSTAT Place 1 tablet (0.4 mg total) under the tongue every 5 (five) minutes as needed for chest pain.   pantoprazole 40  MG tablet Commonly known as:  PROTONIX Take 1 tablet (40 mg total) by mouth daily.   polyethylene glycol packet Commonly known as:  MIRALAX / GLYCOLAX Take 17 g by mouth daily as needed for mild constipation.   PROAIR RESPICLICK 829 (90 Base) MCG/ACT Aepb Generic drug:  Albuterol Sulfate Inhale 1 puff into the lungs every 6 (six) hours as needed (for breathing).   sucroferric oxyhydroxide 500 MG chewable tablet Commonly known as:  VELPHORO Chew 1 tablet (500 mg total) by mouth 3 (three) times daily with meals.   warfarin 1 MG tablet Commonly known as:  COUMADIN Take 0.5 tablets (0.5 mg total) by mouth one time only at 6 PM. What changed:  how much to take  when to take this            Discharge Care Instructions        Start     Ordered   09/15/17 0000  Discharge wound care:    Comments:  Started NPWT to the sacral ulceration, 1pc of black foam used the fill the wound bed, skin to the right hip protected with VAC drape and 1pc of black foam used for bridge to keep patient from lying on theTRAC pad or tubing. Sealed at 174mmHG, patient tolerated, however he received IV pain meds during the dressing change.   Will need SNF that can change NPWT dressings 3x wk, currently  T/Th/Sat.   [place] Nurse ostomy follow up   09/15/17 1113      Vitals:   09/15/17 0500 09/15/17 0941  BP: (!) 98/41 (!) 96/54  Pulse: 72 73  Resp: 18 18  Temp: (!) 97.5 F (36.4 C) 98 F (36.7 C)  SpO2: 94% 96%    Skin clean, dry and intact without evidence of skin break down, no evidence of skin tears noted. IV catheter discontinued intact. Site without signs and symptoms of complications. Dressing and pressure applied. Pt denies pain at this time. No complaints noted.  An After Visit Summary was printed and given to the patient. Patient escorted via stretcher, and D/C to [place] via [place].  Dixie Dials RN, BSN

## 2017-09-15 NOTE — Clinical Social Work Placement (Addendum)
   CLINICAL SOCIAL WORK PLACEMENT  NOTE 09/15/17 - DISCHARGED TO [facility] VIA AMBULANCE  Date:  09/15/2017  Patient Details  Name: [name] MRN: [id number] Date of Birth: 1956/05/30  Clinical Social Work is seeking post-discharge placement for this patient at the [place] level of care (*CSW will initial, date and re-position this form in  chart as items are completed):  Yes   Patient/family provided with [place] Work Department's list of facilities offering this level of care within the geographic area requested by the patient (or if unable, by the patient's family).  Yes   Patient/family informed of their freedom to choose among providers that offer the needed level of care, that participate in Medicare, Medicaid or managed care program needed by the patient, have an available bed and are willing to accept the patient.  Yes   Patient/family informed of [facility]'s ownership interest in [facility] and [facility], as well as of the fact that they are under no obligation to receive care at these facilities.  PASRR submitted to EDS on 09/12/17     PASRR number received on 09/12/17     Existing PASRR number confirmed on       FL2 transmitted to all facilities in geographic area requested by pt/family on 09/12/17     FL2 transmitted to all facilities within larger geographic area on       Patient informed that his/her managed care company has contracts with or will negotiate with certain facilities, including the following:        Yes   Patient/family informed of bed offers received.  Patient chooses bed at [facility]     Physician recommends and patient chooses bed at      Patient to be transferred to [facility] on  09/15/17  Patient to be transferred to facility by  ambulance     Patient family notified on  09/15/17 of transfer.  Name of family member notified:   [name] at the bedside      PHYSICIAN       Additional Comment:    _______________________________________________ [name], LCSW 09/15/2017, 2:23 PM

## 2017-09-15 NOTE — Progress Notes (Signed)
[facility] BRIEF NEPHROLOGY NOTE:  Outpatient HD slot CONFIRMED: [facility], TTS schedule. Can start Saturday 10/13. Needs to be there at 11:15a.  Ok to be discharged from renal standpoint with next HD outpatient on Saturday.  Loren Racer, PA-C  [employer] Pager [phone]

## 2017-09-15 NOTE — Progress Notes (Signed)
ANTICOAGULATION CONSULT NOTE  Pharmacy Consult for Warfarin Indication: atrial fibrillation  No Known Allergies  Patient Measurements: Height: 5\' 10"  (177.8 cm) Weight:  (In chair, unable to stand) IBW/kg (Calculated) : 73  Vital Signs: Temp: 98 F (36.7 C) (10/11 0941) Temp Source: Oral (10/11 0941) BP: 96/54 (10/11 0941) Pulse Rate: 73 (10/11 0941)  Labs:  Recent Labs  09/13/17 0502 09/14/17 1029 09/15/17 0236  HGB 11.0* 11.0* 10.3*  HCT 34.1* 33.3* 32.5*  PLT 83* 70* 68*  LABPROT 31.8* 32.4* 27.4*  INR 3.11 3.19 2.57  CREATININE  --  3.86* 2.65*    Estimated Creatinine Clearance: 28.7 mL/min (A) (by C-G formula based on SCr of 2.65 mg/dL (H)).   Assessment: 84 YOM with AFib on warfarin. PTA the patient had INR checked on 9/28 which resulted at 1.8 and dose was changed to 1mg  daily except 2mg  on Fridays (previously 1mg  every day). PTA dose last taken on 9/28.  He was admitted to [facility] on 09/03/17 for pneumonia, HCAP.  The INR on admission 9/29 was 2.37 therapeutic.   INR today has trended down into the therapeutic range after holding the dose for 4 days. INR 2.57 << 3.19, goal of 2-3). Noted chronic thrombocytopenia with plts 68. Will watch and consider discussing warfarin interruption if plts fall <50. No bleeding noted at this time.  Goal of Therapy:  INR 2-3 Monitor platelets by anticoagulation protocol: Yes   Plan:  1.Warfarin 0.5 mg x 1 dose at 1800 today 2. Will continue to monitor for any signs/symptoms of bleeding and will follow up with PT/INR in the a.m.  Thank you for allowing pharmacy to be a part of this patient's care.  Alycia Rossetti, PharmD, BCPS Clinical Pharmacist Pager: [phone] Clinical phone for 09/15/2017 from 7a-3:30p: [phone] If after 3:30p, please call main pharmacy at: [phone] 09/15/2017 10:29 AM

## 2017-09-16 DIAGNOSIS — R042 Hemoptysis: Secondary | ICD-10-CM | POA: Diagnosis not present

## 2017-09-16 DIAGNOSIS — N184 Chronic kidney disease, stage 4 (severe): Secondary | ICD-10-CM | POA: Diagnosis not present

## 2017-09-16 DIAGNOSIS — J90 Pleural effusion, not elsewhere classified: Secondary | ICD-10-CM | POA: Diagnosis not present

## 2017-09-16 DIAGNOSIS — J189 Pneumonia, unspecified organism: Secondary | ICD-10-CM | POA: Diagnosis not present

## 2017-09-17 DIAGNOSIS — D631 Anemia in chronic kidney disease: Secondary | ICD-10-CM | POA: Diagnosis not present

## 2017-09-17 DIAGNOSIS — Z992 Dependence on renal dialysis: Secondary | ICD-10-CM | POA: Diagnosis not present

## 2017-09-17 DIAGNOSIS — N2581 Secondary hyperparathyroidism of renal origin: Secondary | ICD-10-CM | POA: Diagnosis not present

## 2017-09-17 DIAGNOSIS — N186 End stage renal disease: Secondary | ICD-10-CM | POA: Diagnosis not present

## 2017-09-17 DIAGNOSIS — D509 Iron deficiency anemia, unspecified: Secondary | ICD-10-CM | POA: Diagnosis not present

## 2017-09-18 IMAGING — CR DG CHEST 2V
2 series · 2 of 2 positions shown · non-contrast
Comparison: Radiograph dated 09/30/2015

CLINICAL DATA: 59-year-old male with right chest pain.

EXAM:
CHEST  2 VIEW

[chest pa]
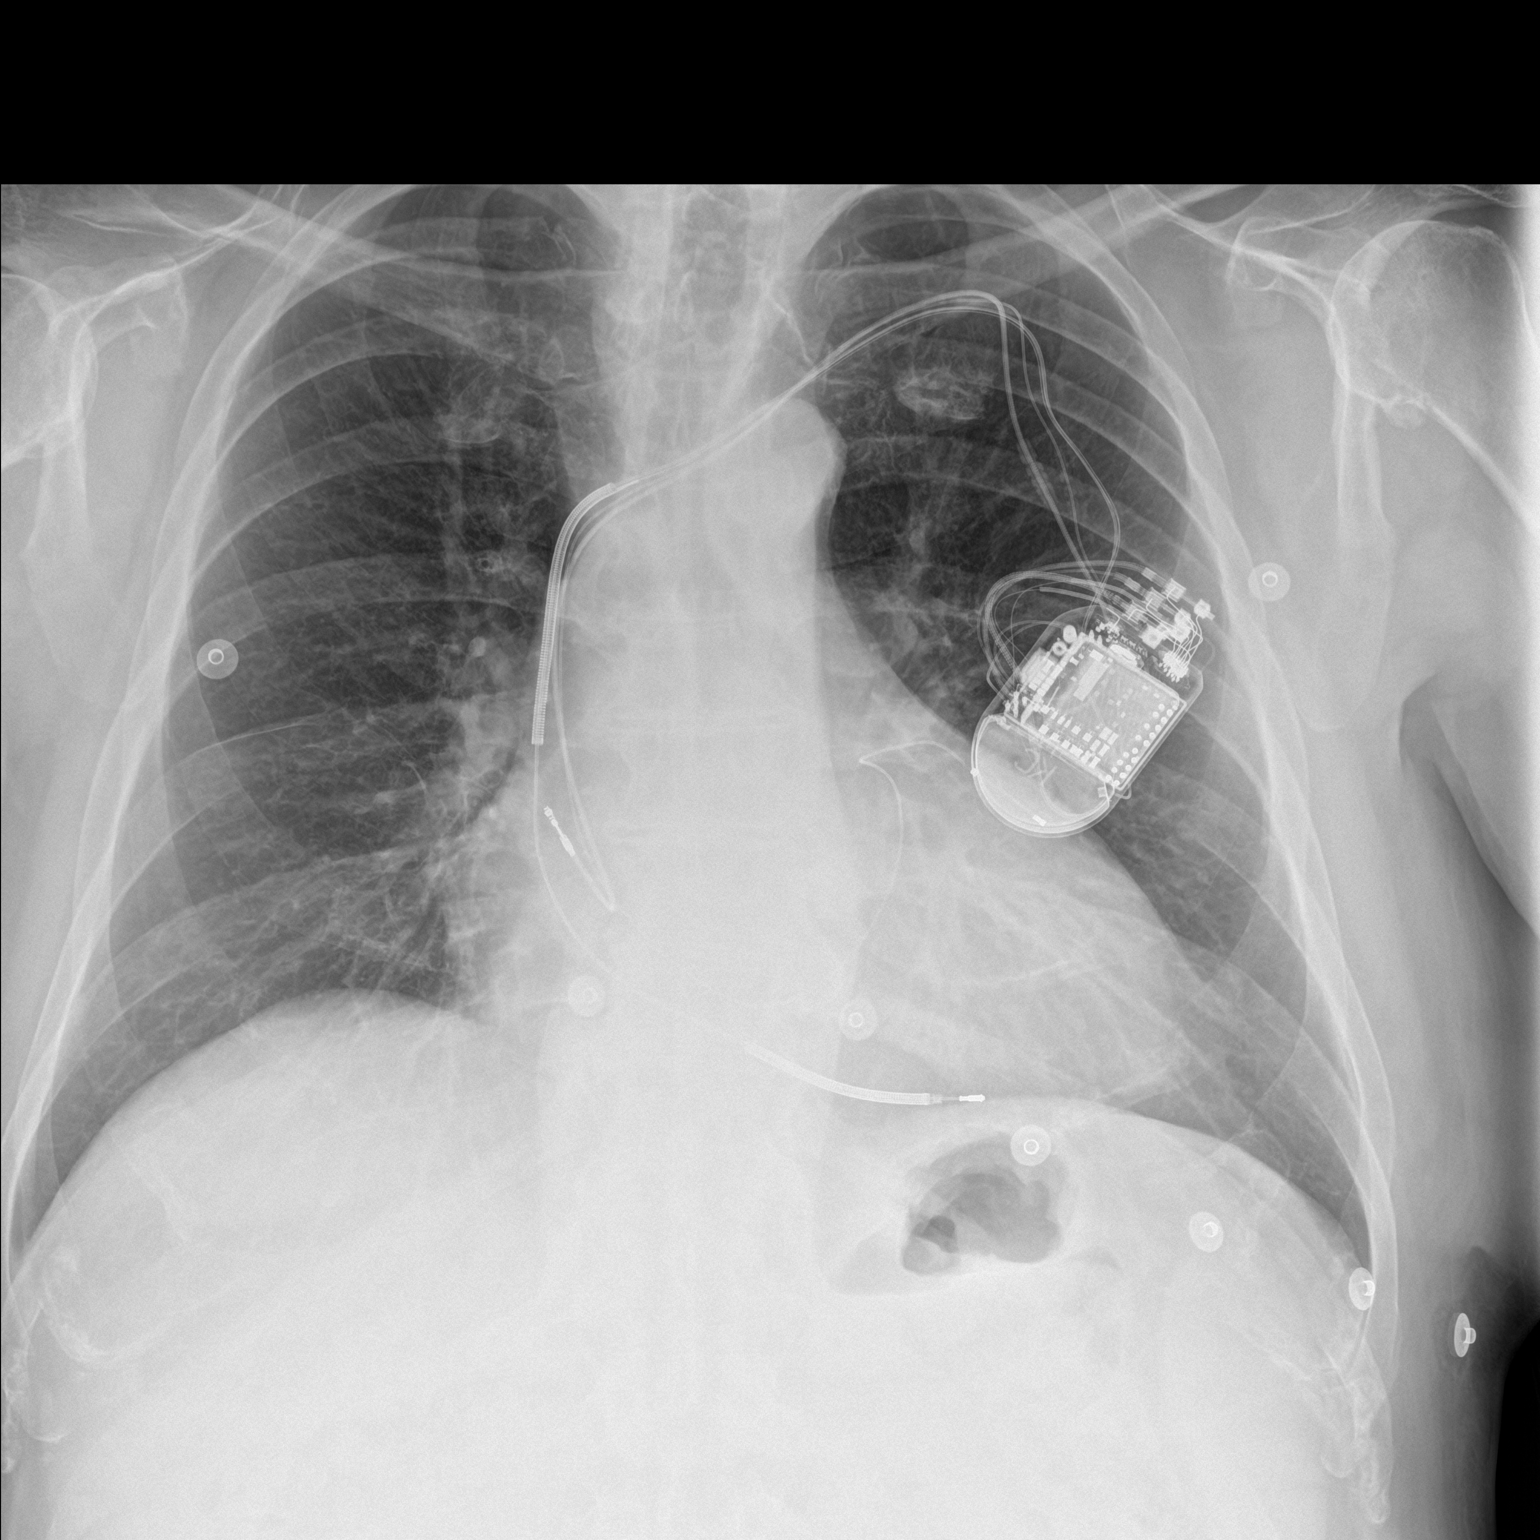

[chest lat]
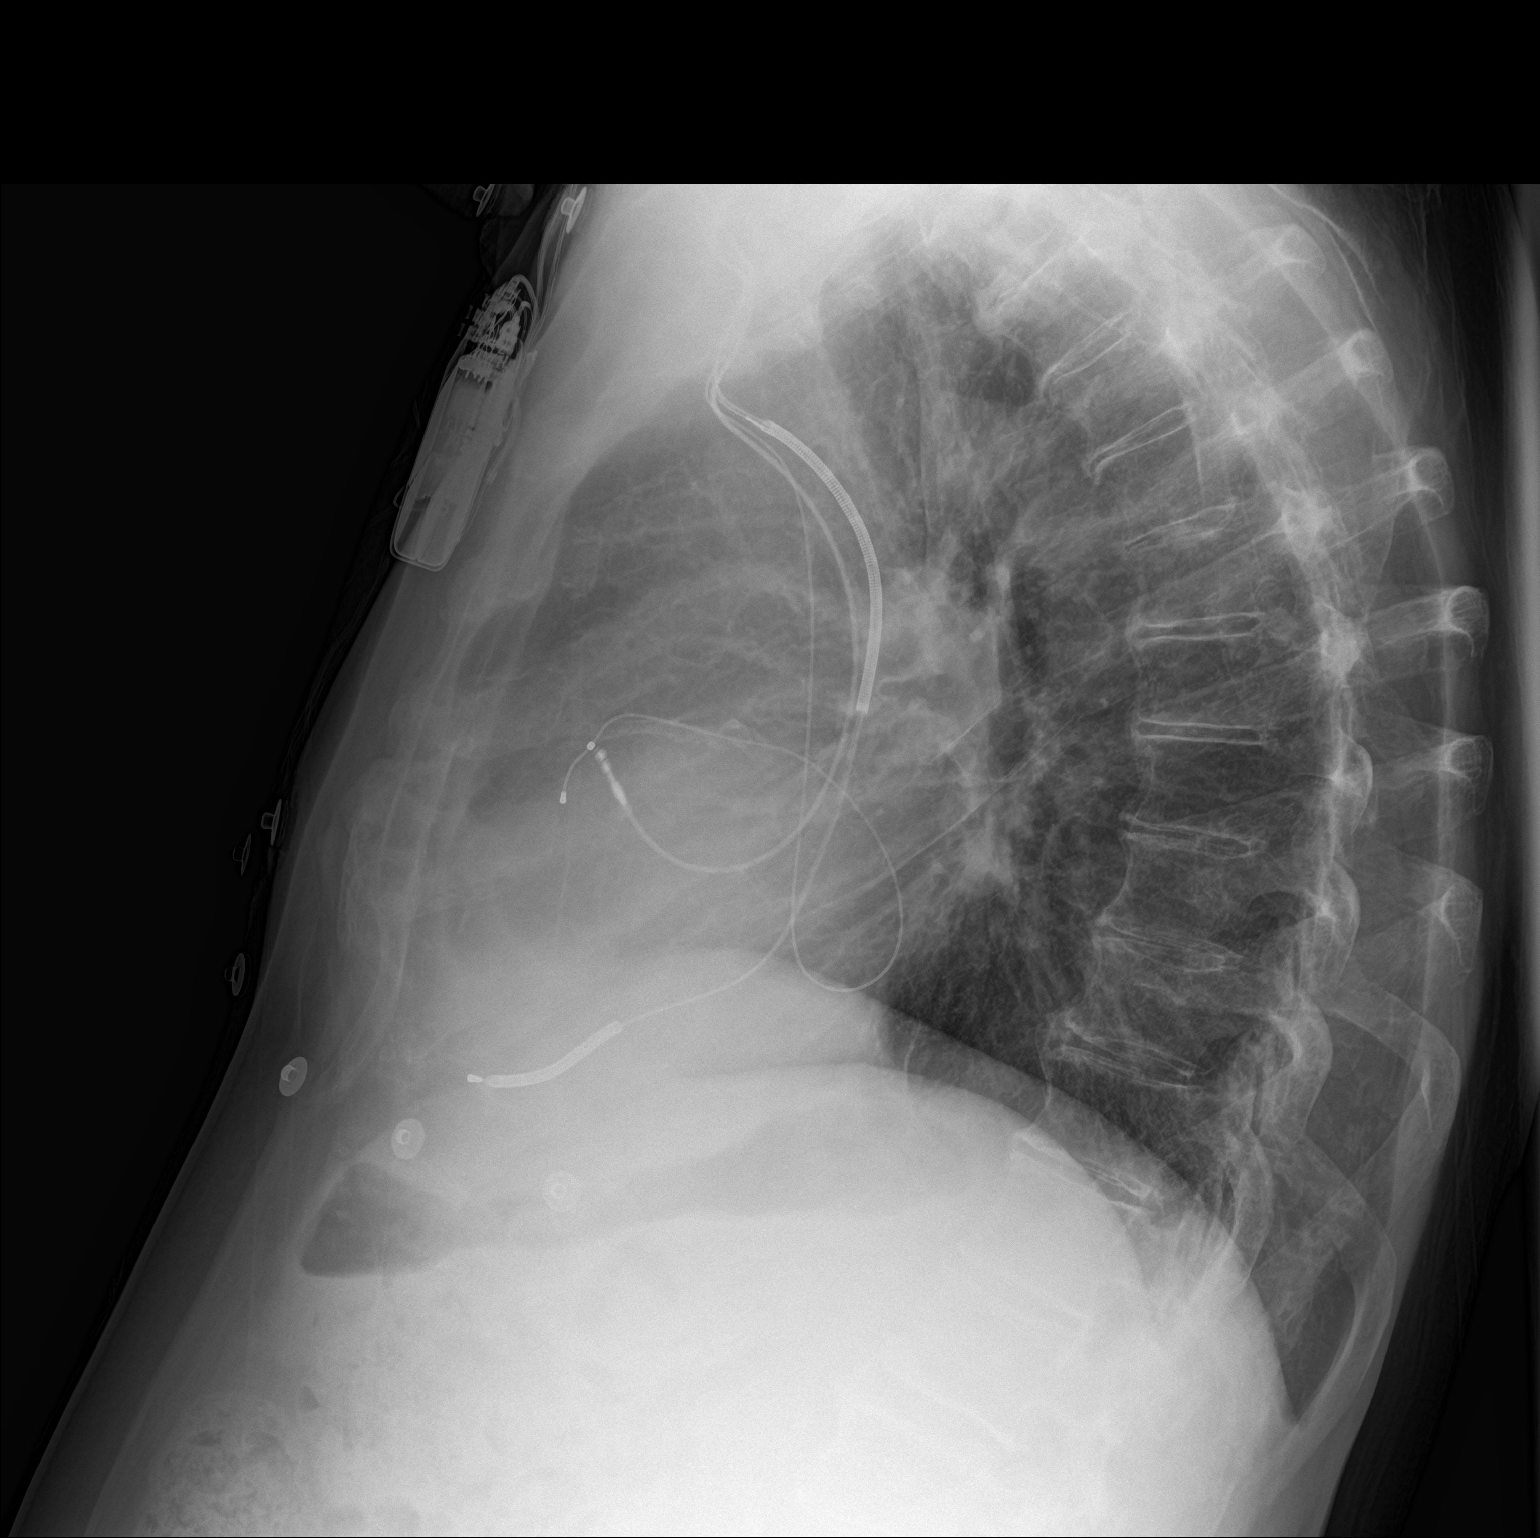

[2 of 2 positions shown; findings below may reference images not displayed]

FINDINGS: Two views of the chest demonstrate clear lungs with sharp
costophrenic angles. There is no pneumothorax. Stable mild
cardiomegaly. Left pectoral AICD device. There is osteopenia with
degenerative changes of the spine. No acute fracture identified.
IMPRESSION: No active cardiopulmonary disease.

## 2017-09-19 DIAGNOSIS — L89154 Pressure ulcer of sacral region, stage 4: Secondary | ICD-10-CM | POA: Diagnosis not present

## 2017-09-19 DIAGNOSIS — E440 Moderate protein-calorie malnutrition: Secondary | ICD-10-CM | POA: Diagnosis not present

## 2017-09-19 DIAGNOSIS — I255 Ischemic cardiomyopathy: Secondary | ICD-10-CM | POA: Diagnosis not present

## 2017-09-19 DIAGNOSIS — L89610 Pressure ulcer of right heel, unstageable: Secondary | ICD-10-CM | POA: Diagnosis not present

## 2017-09-19 DIAGNOSIS — I2510 Atherosclerotic heart disease of native coronary artery without angina pectoris: Secondary | ICD-10-CM | POA: Diagnosis not present

## 2017-09-19 DIAGNOSIS — L89890 Pressure ulcer of other site, unstageable: Secondary | ICD-10-CM | POA: Diagnosis not present

## 2017-09-19 DIAGNOSIS — N184 Chronic kidney disease, stage 4 (severe): Secondary | ICD-10-CM | POA: Diagnosis not present

## 2017-09-19 DIAGNOSIS — G4733 Obstructive sleep apnea (adult) (pediatric): Secondary | ICD-10-CM | POA: Diagnosis not present

## 2017-09-20 ENCOUNTER — Inpatient Hospital Stay: Payer: Medicare Other | Admitting: Physical Medicine & Rehabilitation

## 2017-09-20 DIAGNOSIS — Z992 Dependence on renal dialysis: Secondary | ICD-10-CM | POA: Diagnosis not present

## 2017-09-20 DIAGNOSIS — D509 Iron deficiency anemia, unspecified: Secondary | ICD-10-CM | POA: Diagnosis not present

## 2017-09-20 DIAGNOSIS — N2581 Secondary hyperparathyroidism of renal origin: Secondary | ICD-10-CM | POA: Diagnosis not present

## 2017-09-20 DIAGNOSIS — D631 Anemia in chronic kidney disease: Secondary | ICD-10-CM | POA: Diagnosis not present

## 2017-09-20 DIAGNOSIS — N186 End stage renal disease: Secondary | ICD-10-CM | POA: Diagnosis not present

## 2017-09-21 DIAGNOSIS — Z79899 Other long term (current) drug therapy: Secondary | ICD-10-CM | POA: Diagnosis not present

## 2017-09-21 DIAGNOSIS — E119 Type 2 diabetes mellitus without complications: Secondary | ICD-10-CM | POA: Diagnosis not present

## 2017-09-21 DIAGNOSIS — E7849 Other hyperlipidemia: Secondary | ICD-10-CM | POA: Diagnosis not present

## 2017-09-21 DIAGNOSIS — D518 Other vitamin B12 deficiency anemias: Secondary | ICD-10-CM | POA: Diagnosis not present

## 2017-09-22 DIAGNOSIS — Z992 Dependence on renal dialysis: Secondary | ICD-10-CM | POA: Diagnosis not present

## 2017-09-22 DIAGNOSIS — N186 End stage renal disease: Secondary | ICD-10-CM | POA: Diagnosis not present

## 2017-09-22 DIAGNOSIS — D509 Iron deficiency anemia, unspecified: Secondary | ICD-10-CM | POA: Diagnosis not present

## 2017-09-22 DIAGNOSIS — N2581 Secondary hyperparathyroidism of renal origin: Secondary | ICD-10-CM | POA: Diagnosis not present

## 2017-09-22 DIAGNOSIS — D631 Anemia in chronic kidney disease: Secondary | ICD-10-CM | POA: Diagnosis not present

## 2017-09-23 ENCOUNTER — Encounter: Payer: Self-pay | Admitting: Cardiology

## 2017-09-23 DIAGNOSIS — E1165 Type 2 diabetes mellitus with hyperglycemia: Secondary | ICD-10-CM | POA: Diagnosis not present

## 2017-09-23 DIAGNOSIS — N184 Chronic kidney disease, stage 4 (severe): Secondary | ICD-10-CM | POA: Diagnosis not present

## 2017-09-23 DIAGNOSIS — I482 Chronic atrial fibrillation: Secondary | ICD-10-CM | POA: Diagnosis not present

## 2017-09-23 DIAGNOSIS — J189 Pneumonia, unspecified organism: Secondary | ICD-10-CM | POA: Diagnosis not present

## 2017-09-24 DIAGNOSIS — Z992 Dependence on renal dialysis: Secondary | ICD-10-CM | POA: Diagnosis not present

## 2017-09-24 DIAGNOSIS — D509 Iron deficiency anemia, unspecified: Secondary | ICD-10-CM | POA: Diagnosis not present

## 2017-09-24 DIAGNOSIS — N2581 Secondary hyperparathyroidism of renal origin: Secondary | ICD-10-CM | POA: Diagnosis not present

## 2017-09-24 DIAGNOSIS — D631 Anemia in chronic kidney disease: Secondary | ICD-10-CM | POA: Diagnosis not present

## 2017-09-24 DIAGNOSIS — N186 End stage renal disease: Secondary | ICD-10-CM | POA: Diagnosis not present

## 2017-09-26 DIAGNOSIS — L89154 Pressure ulcer of sacral region, stage 4: Secondary | ICD-10-CM | POA: Diagnosis not present

## 2017-09-26 DIAGNOSIS — L89890 Pressure ulcer of other site, unstageable: Secondary | ICD-10-CM | POA: Diagnosis not present

## 2017-09-26 DIAGNOSIS — L89610 Pressure ulcer of right heel, unstageable: Secondary | ICD-10-CM | POA: Diagnosis not present

## 2017-09-26 DIAGNOSIS — R5381 Other malaise: Secondary | ICD-10-CM | POA: Diagnosis not present

## 2017-09-27 DIAGNOSIS — B351 Tinea unguium: Secondary | ICD-10-CM | POA: Diagnosis not present

## 2017-09-27 DIAGNOSIS — L97521 Non-pressure chronic ulcer of other part of left foot limited to breakdown of skin: Secondary | ICD-10-CM | POA: Diagnosis not present

## 2017-09-27 DIAGNOSIS — E1165 Type 2 diabetes mellitus with hyperglycemia: Secondary | ICD-10-CM | POA: Diagnosis not present

## 2017-09-27 DIAGNOSIS — Z992 Dependence on renal dialysis: Secondary | ICD-10-CM | POA: Diagnosis not present

## 2017-09-27 DIAGNOSIS — N2581 Secondary hyperparathyroidism of renal origin: Secondary | ICD-10-CM | POA: Diagnosis not present

## 2017-09-27 DIAGNOSIS — D631 Anemia in chronic kidney disease: Secondary | ICD-10-CM | POA: Diagnosis not present

## 2017-09-27 DIAGNOSIS — D509 Iron deficiency anemia, unspecified: Secondary | ICD-10-CM | POA: Diagnosis not present

## 2017-09-27 DIAGNOSIS — N186 End stage renal disease: Secondary | ICD-10-CM | POA: Diagnosis not present

## 2017-09-28 DIAGNOSIS — Z79899 Other long term (current) drug therapy: Secondary | ICD-10-CM | POA: Diagnosis not present

## 2017-09-29 ENCOUNTER — Other Ambulatory Visit: Payer: Self-pay

## 2017-09-29 DIAGNOSIS — Z992 Dependence on renal dialysis: Secondary | ICD-10-CM | POA: Diagnosis not present

## 2017-09-29 DIAGNOSIS — N2581 Secondary hyperparathyroidism of renal origin: Secondary | ICD-10-CM | POA: Diagnosis not present

## 2017-09-29 DIAGNOSIS — N186 End stage renal disease: Secondary | ICD-10-CM | POA: Diagnosis not present

## 2017-09-29 DIAGNOSIS — D631 Anemia in chronic kidney disease: Secondary | ICD-10-CM | POA: Diagnosis not present

## 2017-09-29 DIAGNOSIS — D509 Iron deficiency anemia, unspecified: Secondary | ICD-10-CM | POA: Diagnosis not present

## 2017-09-29 NOTE — Patient Outreach (Signed)
[place] [facility]) Care Management  09/29/2017  [name] 1956-01-24 [id number]     Transition of Care Referral  Referral Date: 09/29/17 Referral Source: [facility] Referral Reason: "continued support to get home at a safe level" Date of Admission: discharged from [facility] to [facility] Diagnosis: HCAP Date of Discharge: unknown Insurance: Medicare     Outreach attempt # 1 to patient. No answer at present. RN CM left HIPAA compliant voicemail message along with contact info.      Plan: RN CM will make outreach attempt   Enzo Montgomery, RN,BSN,CCM [place] Management Telephonic Care Management Coordinator Direct Phone: [phone] Toll Free: [phone] Fax: [phone]

## 2017-09-30 ENCOUNTER — Other Ambulatory Visit: Payer: Self-pay

## 2017-09-30 DIAGNOSIS — N184 Chronic kidney disease, stage 4 (severe): Secondary | ICD-10-CM | POA: Diagnosis not present

## 2017-09-30 DIAGNOSIS — I482 Chronic atrial fibrillation: Secondary | ICD-10-CM | POA: Diagnosis not present

## 2017-09-30 DIAGNOSIS — J189 Pneumonia, unspecified organism: Secondary | ICD-10-CM | POA: Diagnosis not present

## 2017-09-30 DIAGNOSIS — E1165 Type 2 diabetes mellitus with hyperglycemia: Secondary | ICD-10-CM | POA: Diagnosis not present

## 2017-09-30 NOTE — Patient Outreach (Signed)
[name]) Care Management  09/30/2017  [name] 1956-01-09 [id number]   Transition of Care Referral  Referral Date: 09/29/17 Referral Source: [facility] Referral Reason: "continued support to get home at a safe level" Date of Admission: discharged from [facility] to [facility] Diagnosis: HCAP Date of Discharge: unknown Insurance: Medicare   Outreach attempt #2 to patient. No answer at present. RN CM attempted alternate number listed for patient. No answer and HIPAA compliant voicemail message left along with contact info.     Plan: RN CM will make outreach attempt to patient within three business days.    Enzo Montgomery, RN,BSN,CCM [place] Management Telephonic Care Management Coordinator Direct Phone: [phone] Toll Free: [phone] Fax: [phone]

## 2017-10-01 DIAGNOSIS — D631 Anemia in chronic kidney disease: Secondary | ICD-10-CM | POA: Diagnosis not present

## 2017-10-01 DIAGNOSIS — Z992 Dependence on renal dialysis: Secondary | ICD-10-CM | POA: Diagnosis not present

## 2017-10-01 DIAGNOSIS — N186 End stage renal disease: Secondary | ICD-10-CM | POA: Diagnosis not present

## 2017-10-01 DIAGNOSIS — D509 Iron deficiency anemia, unspecified: Secondary | ICD-10-CM | POA: Diagnosis not present

## 2017-10-01 DIAGNOSIS — N2581 Secondary hyperparathyroidism of renal origin: Secondary | ICD-10-CM | POA: Diagnosis not present

## 2017-10-03 DIAGNOSIS — Z79899 Other long term (current) drug therapy: Secondary | ICD-10-CM | POA: Diagnosis not present

## 2017-10-04 ENCOUNTER — Other Ambulatory Visit: Payer: Self-pay

## 2017-10-04 DIAGNOSIS — N186 End stage renal disease: Secondary | ICD-10-CM | POA: Diagnosis not present

## 2017-10-04 DIAGNOSIS — D509 Iron deficiency anemia, unspecified: Secondary | ICD-10-CM | POA: Diagnosis not present

## 2017-10-04 DIAGNOSIS — N2581 Secondary hyperparathyroidism of renal origin: Secondary | ICD-10-CM | POA: Diagnosis not present

## 2017-10-04 DIAGNOSIS — D631 Anemia in chronic kidney disease: Secondary | ICD-10-CM | POA: Diagnosis not present

## 2017-10-04 DIAGNOSIS — Z992 Dependence on renal dialysis: Secondary | ICD-10-CM | POA: Diagnosis not present

## 2017-10-04 NOTE — Patient Outreach (Signed)
[place] [facility]) Care Management  10/04/2017  [name] 03/09/1956 [id number]   Transition of Care Referral  Referral Date: 09/29/17 Referral Source: [facility] Referral Reason: "continued support to get home at a safe level" Date of Admission: discharged from [facility] to [facility] Diagnosis: HCAP Date of Discharge: unknown Insurance: Medicare   Outreach attempt #3 to patient.No answer. RN CM left HIPAA compliant voicemail message along with contact info.      Plan: RN CM will make send unsuccessful outreach letter to patient and close case if no response within 10 business days.     Enzo Montgomery, RN,BSN,CCM [place] Management Telephonic Care Management Coordinator Direct Phone: [phone] Toll Free: [phone] Fax: [phone]

## 2017-10-05 DIAGNOSIS — N184 Chronic kidney disease, stage 4 (severe): Secondary | ICD-10-CM | POA: Diagnosis not present

## 2017-10-05 DIAGNOSIS — J189 Pneumonia, unspecified organism: Secondary | ICD-10-CM | POA: Diagnosis not present

## 2017-10-05 DIAGNOSIS — E1165 Type 2 diabetes mellitus with hyperglycemia: Secondary | ICD-10-CM | POA: Diagnosis not present

## 2017-10-05 DIAGNOSIS — I482 Chronic atrial fibrillation: Secondary | ICD-10-CM | POA: Diagnosis not present

## 2017-10-06 DIAGNOSIS — Z992 Dependence on renal dialysis: Secondary | ICD-10-CM | POA: Diagnosis not present

## 2017-10-06 DIAGNOSIS — D631 Anemia in chronic kidney disease: Secondary | ICD-10-CM | POA: Diagnosis not present

## 2017-10-06 DIAGNOSIS — N186 End stage renal disease: Secondary | ICD-10-CM | POA: Diagnosis not present

## 2017-10-06 DIAGNOSIS — D509 Iron deficiency anemia, unspecified: Secondary | ICD-10-CM | POA: Diagnosis not present

## 2017-10-08 DIAGNOSIS — Z992 Dependence on renal dialysis: Secondary | ICD-10-CM | POA: Diagnosis not present

## 2017-10-08 DIAGNOSIS — D631 Anemia in chronic kidney disease: Secondary | ICD-10-CM | POA: Diagnosis not present

## 2017-10-08 DIAGNOSIS — N186 End stage renal disease: Secondary | ICD-10-CM | POA: Diagnosis not present

## 2017-10-08 DIAGNOSIS — D509 Iron deficiency anemia, unspecified: Secondary | ICD-10-CM | POA: Diagnosis not present

## 2017-10-10 DIAGNOSIS — L89890 Pressure ulcer of other site, unstageable: Secondary | ICD-10-CM | POA: Diagnosis not present

## 2017-10-10 DIAGNOSIS — Z79899 Other long term (current) drug therapy: Secondary | ICD-10-CM | POA: Diagnosis not present

## 2017-10-10 DIAGNOSIS — L89610 Pressure ulcer of right heel, unstageable: Secondary | ICD-10-CM | POA: Diagnosis not present

## 2017-10-10 DIAGNOSIS — L89154 Pressure ulcer of sacral region, stage 4: Secondary | ICD-10-CM | POA: Diagnosis not present

## 2017-10-11 DIAGNOSIS — N186 End stage renal disease: Secondary | ICD-10-CM | POA: Diagnosis not present

## 2017-10-11 DIAGNOSIS — D631 Anemia in chronic kidney disease: Secondary | ICD-10-CM | POA: Diagnosis not present

## 2017-10-11 DIAGNOSIS — D509 Iron deficiency anemia, unspecified: Secondary | ICD-10-CM | POA: Diagnosis not present

## 2017-10-11 DIAGNOSIS — Z992 Dependence on renal dialysis: Secondary | ICD-10-CM | POA: Diagnosis not present

## 2017-10-13 ENCOUNTER — Encounter (HOSPITAL_COMMUNITY): Payer: Medicare Other | Admitting: Internal Medicine

## 2017-10-13 DIAGNOSIS — N186 End stage renal disease: Secondary | ICD-10-CM | POA: Diagnosis not present

## 2017-10-13 DIAGNOSIS — D509 Iron deficiency anemia, unspecified: Secondary | ICD-10-CM | POA: Diagnosis not present

## 2017-10-13 DIAGNOSIS — Z992 Dependence on renal dialysis: Secondary | ICD-10-CM | POA: Diagnosis not present

## 2017-10-13 DIAGNOSIS — D631 Anemia in chronic kidney disease: Secondary | ICD-10-CM | POA: Diagnosis not present

## 2017-10-14 DIAGNOSIS — E1165 Type 2 diabetes mellitus with hyperglycemia: Secondary | ICD-10-CM | POA: Diagnosis not present

## 2017-10-14 DIAGNOSIS — G4733 Obstructive sleep apnea (adult) (pediatric): Secondary | ICD-10-CM | POA: Diagnosis not present

## 2017-10-14 DIAGNOSIS — N184 Chronic kidney disease, stage 4 (severe): Secondary | ICD-10-CM | POA: Diagnosis not present

## 2017-10-14 DIAGNOSIS — I482 Chronic atrial fibrillation: Secondary | ICD-10-CM | POA: Diagnosis not present

## 2017-10-15 DIAGNOSIS — D631 Anemia in chronic kidney disease: Secondary | ICD-10-CM | POA: Diagnosis not present

## 2017-10-15 DIAGNOSIS — D509 Iron deficiency anemia, unspecified: Secondary | ICD-10-CM | POA: Diagnosis not present

## 2017-10-15 DIAGNOSIS — N186 End stage renal disease: Secondary | ICD-10-CM | POA: Diagnosis not present

## 2017-10-15 DIAGNOSIS — Z992 Dependence on renal dialysis: Secondary | ICD-10-CM | POA: Diagnosis not present

## 2017-10-17 DIAGNOSIS — Z79899 Other long term (current) drug therapy: Secondary | ICD-10-CM | POA: Diagnosis not present

## 2017-10-18 ENCOUNTER — Other Ambulatory Visit: Payer: Self-pay

## 2017-10-18 DIAGNOSIS — D631 Anemia in chronic kidney disease: Secondary | ICD-10-CM | POA: Diagnosis not present

## 2017-10-18 DIAGNOSIS — N186 End stage renal disease: Secondary | ICD-10-CM | POA: Diagnosis not present

## 2017-10-18 DIAGNOSIS — Z992 Dependence on renal dialysis: Secondary | ICD-10-CM | POA: Diagnosis not present

## 2017-10-18 DIAGNOSIS — D509 Iron deficiency anemia, unspecified: Secondary | ICD-10-CM | POA: Diagnosis not present

## 2017-10-18 NOTE — Patient Outreach (Signed)
[facility]) Care Management  10/18/2017  VISHAAL STROLLO 12/23/1955 [id number]   Transition of Care Referral  Referral Date: 09/29/17 Referral Source: [facility] Referral Reason: "continued support to get home at a safe level" Date of Admission: discharged from [facility] to [facility] Diagnosis: HCAP Date of Discharge: unknown Insurance: Medicare   Multiple attempts to establish contact with patient without success. No response from letter mailed to patient. Case is being closed at this time.      Plan: RN CM will notify [facility] administrative assistant of case status.    Enzo Montgomery, RN,BSN,CCM [place] Management Telephonic Care Management Coordinator Direct Phone: [phone] Toll Free: [phone] Fax: [phone]

## 2017-10-19 ENCOUNTER — Encounter (HOSPITAL_COMMUNITY): Payer: Medicare Other

## 2017-10-19 DIAGNOSIS — E1165 Type 2 diabetes mellitus with hyperglycemia: Secondary | ICD-10-CM | POA: Diagnosis not present

## 2017-10-19 DIAGNOSIS — J189 Pneumonia, unspecified organism: Secondary | ICD-10-CM | POA: Diagnosis not present

## 2017-10-19 DIAGNOSIS — I482 Chronic atrial fibrillation: Secondary | ICD-10-CM | POA: Diagnosis not present

## 2017-10-19 DIAGNOSIS — N184 Chronic kidney disease, stage 4 (severe): Secondary | ICD-10-CM | POA: Diagnosis not present

## 2017-10-19 IMAGING — NM NM HEPATO W/GB/PHARM/[PERSON_NAME]
2 series · 12 of 12 positions shown · non-contrast
Comparison: Ultrasound performed today. Ultrasound 05/10/2016. CT
05/05/2016.

CLINICAL DATA: Right upper quadrant pain. Nausea, vomiting.
Cholelithiasis. Elevated LFTs.

EXAM:
NUCLEAR MEDICINE HEPATOBILIARY IMAGING
TECHNIQUE: Sequential images of the abdomen were obtained [DATE] minutes
following intravenous administration of radiopharmaceutical.
RADIOPHARMACEUTICALS:  5.1 mCi Dc-GGm  Choletec IV

[he hepatobiliary · 3.43mm/px · 6 of 60 frames shown (1 of 2)]
[frame 6/60]
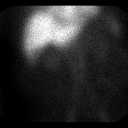
[frame 16/60]
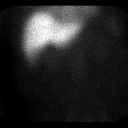
[frame 26/60]
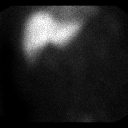
[frame 36/60]
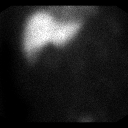
[frame 46/60]
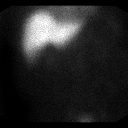
[frame 56/60]
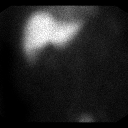

[he hepatobiliary · 3.43mm/px · 6 of 60 frames shown (2 of 2)]
[frame 6/60]
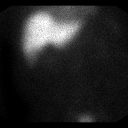
[frame 16/60]
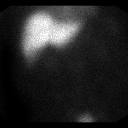
[frame 26/60]
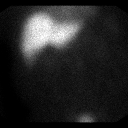
[frame 36/60]
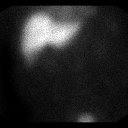
[frame 46/60]
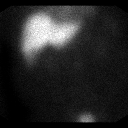
[frame 56/60]
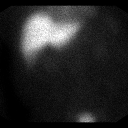

[12 of 12 positions shown; findings below may reference images not displayed]

FINDINGS: There is uptake of radiotracer by the liver. This is somewhat
delayed as there is still some activity seen within the heart [DATE] hours. There is no excretion of radiotracer by the liver.
Nonvisualization of the biliary system [DATE] hours.
IMPRESSION: Somewhat delayed hepatic uptake of radiotracer. No hepatic excretion
of radiotracer. Favor this is related to hepatic dysfunction,
possibly hepatitis. Cannot assess gallbladder filling without
biliary excretion. Given the findings on recent ultrasound,
recommend further evaluation of the biliary system with MRI and
MRCP.

## 2017-10-19 IMAGING — CR DG CHEST 2V
2 series · 2 of 2 positions shown · non-contrast
Comparison: May 09, 2016

CLINICAL DATA: Chest pain with shortness of breath for 1 day

EXAM:
CHEST  2 VIEW

[chest pa]
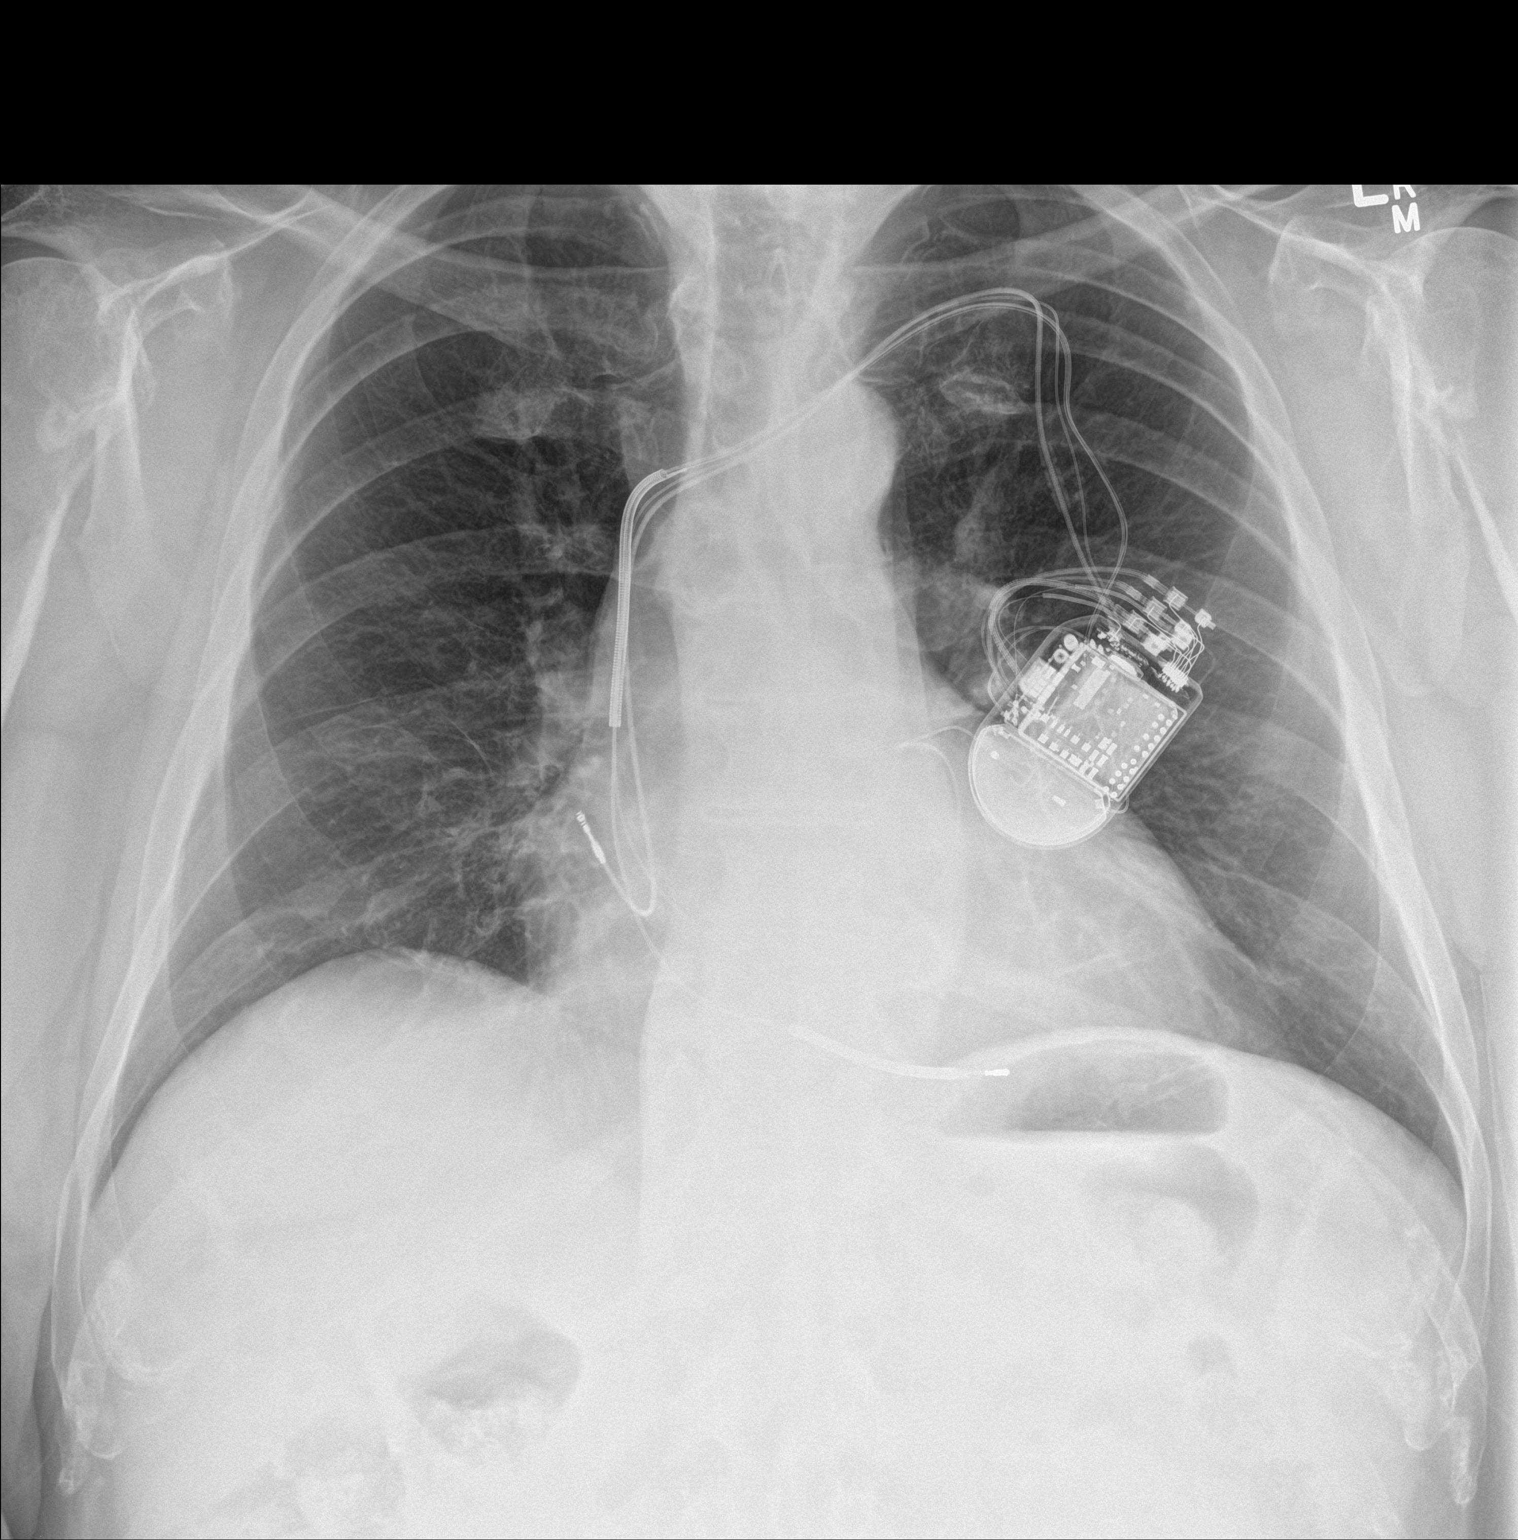

[chest lat]
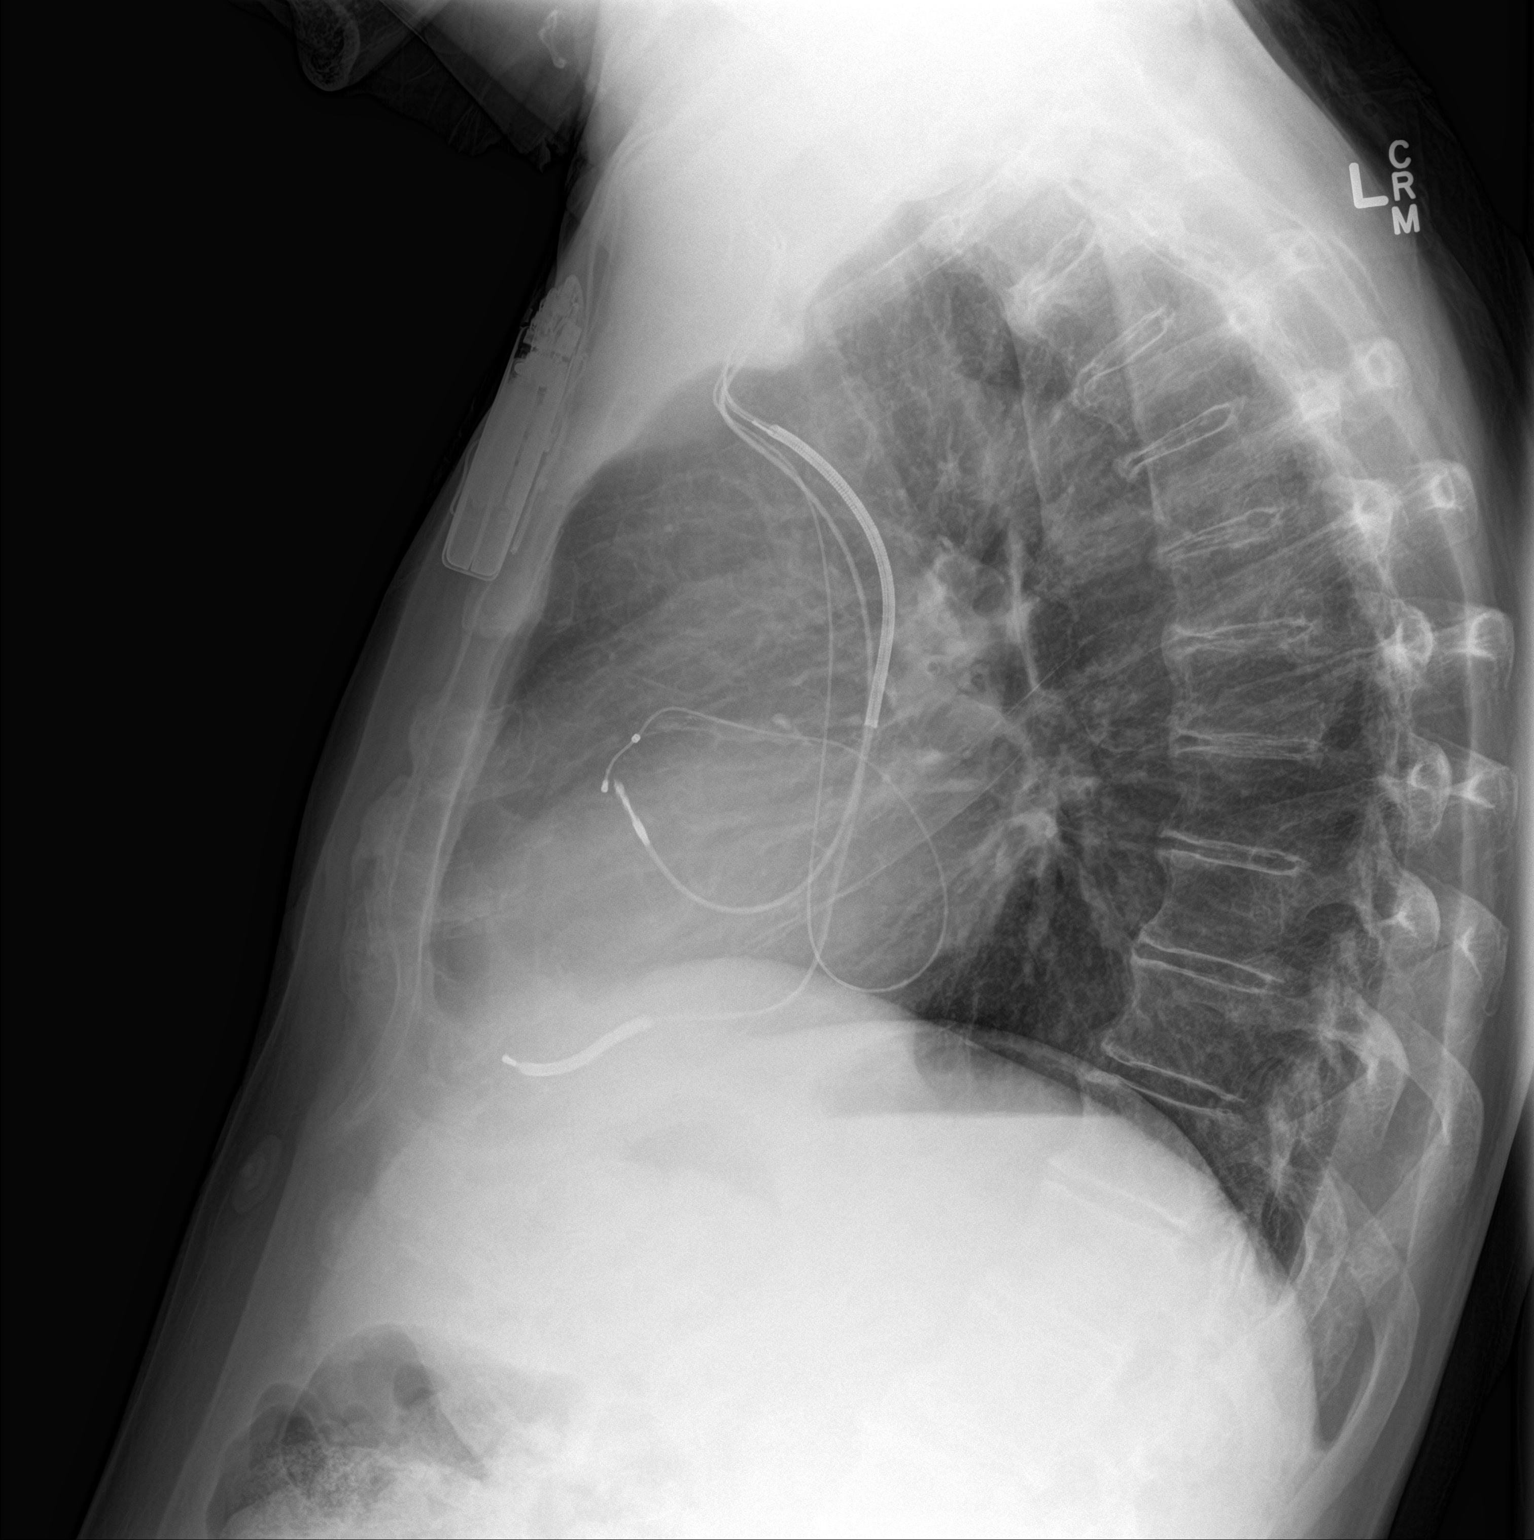

[2 of 2 positions shown; findings below may reference images not displayed]

FINDINGS: There is no edema or consolidation. Heart is upper normal in size
with pulmonary vascularity within normal limits. Pacemaker leads are
attached to the right heart, stable. No adenopathy. No pneumothorax.
There is atherosclerotic calcification in the aorta. There is
degenerative change in the thoracic spine and in the shoulders
bilaterally.
IMPRESSION: No edema or consolidation. Stable pacemaker lead positions. Stable
cardiac silhouette. Aortic atherosclerosis.

## 2017-10-20 DIAGNOSIS — D509 Iron deficiency anemia, unspecified: Secondary | ICD-10-CM | POA: Diagnosis not present

## 2017-10-20 DIAGNOSIS — Z992 Dependence on renal dialysis: Secondary | ICD-10-CM | POA: Diagnosis not present

## 2017-10-20 DIAGNOSIS — D631 Anemia in chronic kidney disease: Secondary | ICD-10-CM | POA: Diagnosis not present

## 2017-10-20 DIAGNOSIS — N186 End stage renal disease: Secondary | ICD-10-CM | POA: Diagnosis not present

## 2017-10-21 DIAGNOSIS — L89153 Pressure ulcer of sacral region, stage 3: Secondary | ICD-10-CM | POA: Diagnosis not present

## 2017-10-21 DIAGNOSIS — L89620 Pressure ulcer of left heel, unstageable: Secondary | ICD-10-CM | POA: Diagnosis not present

## 2017-10-21 DIAGNOSIS — L89890 Pressure ulcer of other site, unstageable: Secondary | ICD-10-CM | POA: Diagnosis not present

## 2017-10-21 DIAGNOSIS — E1122 Type 2 diabetes mellitus with diabetic chronic kidney disease: Secondary | ICD-10-CM | POA: Diagnosis not present

## 2017-10-21 DIAGNOSIS — I120 Hypertensive chronic kidney disease with stage 5 chronic kidney disease or end stage renal disease: Secondary | ICD-10-CM | POA: Diagnosis not present

## 2017-10-21 DIAGNOSIS — L89610 Pressure ulcer of right heel, unstageable: Secondary | ICD-10-CM | POA: Diagnosis not present

## 2017-10-21 IMAGING — RF DG ERCP WO/W SPHINCTEROTOMY
1 series · 8 of 8 positions shown · non-contrast
Comparison: Nuclear medicine HIDA scan 06/09/2016; abdominal
ultrasound 06/10/2016 ; CT abdomen/ pelvis 05/05/2016

CLINICAL DATA: 59-year-old male with cholelithiasis

EXAM:
ERCP
TECHNIQUE: Multiple spot images obtained with the fluoroscopic device and
submitted for interpretation post-procedure.
FLUOROSCOPY TIME:  Please see GI operative note for further detail

[Series 1: run · 8 of 8 slices shown]
[im 1/8]
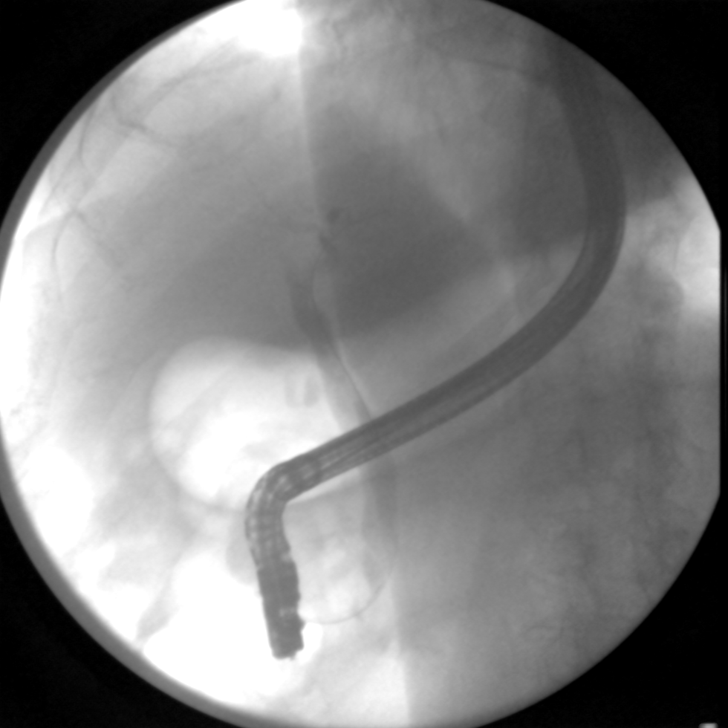
[im 2/8]
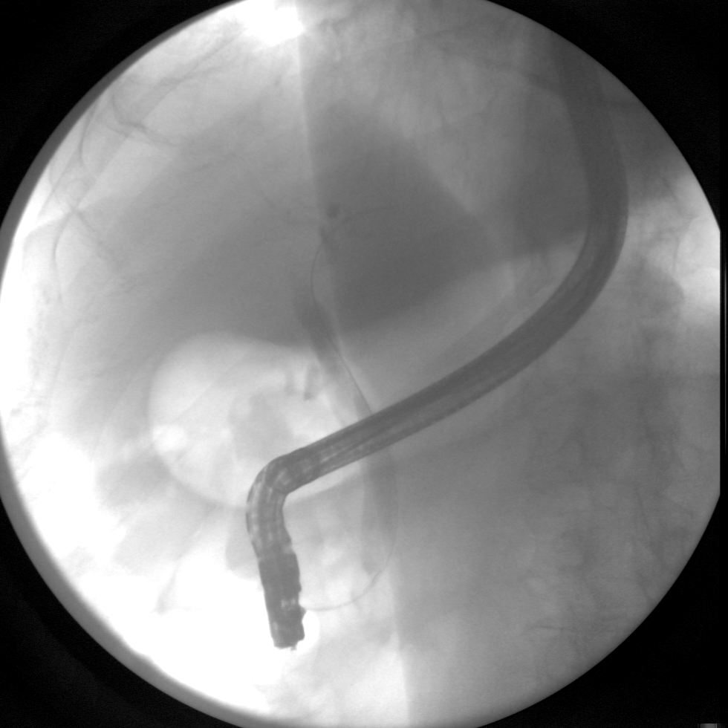
[im 3/8]
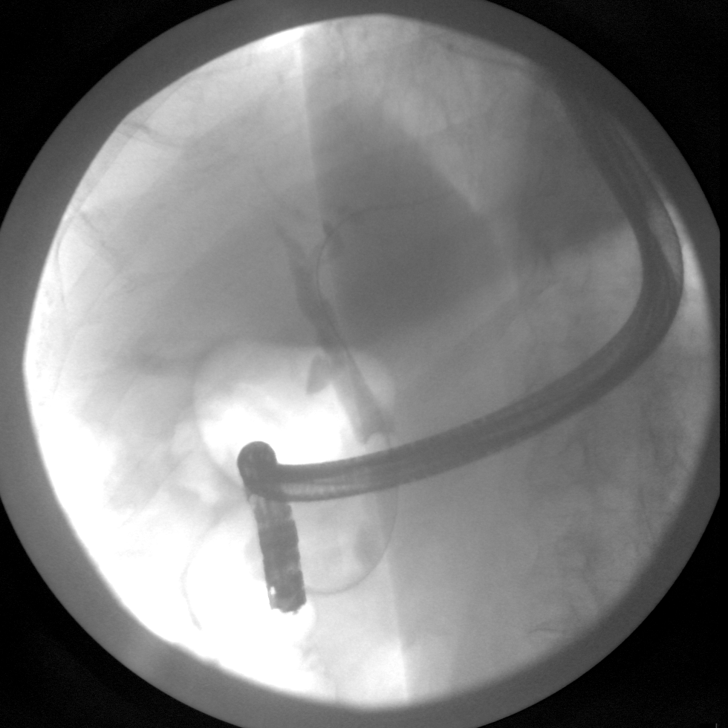
[im 4/8]
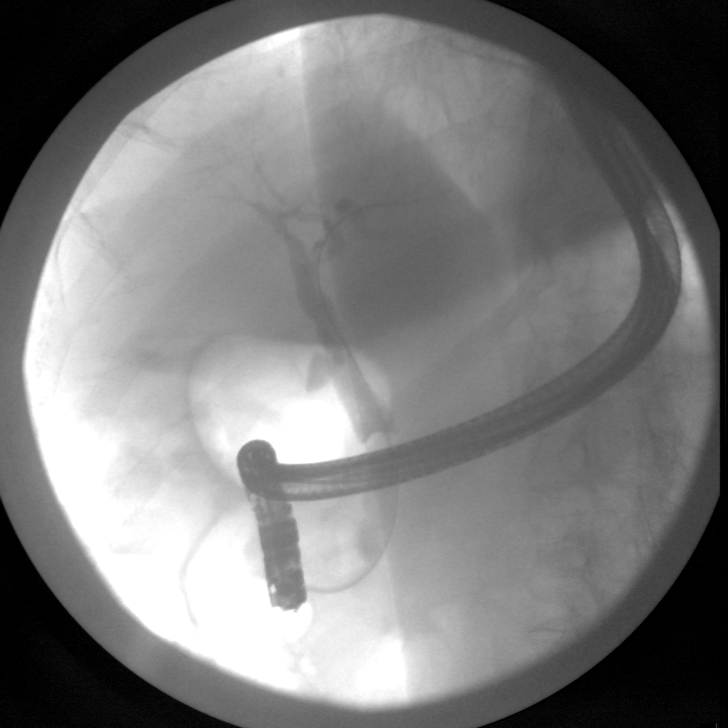
[im 5/8]
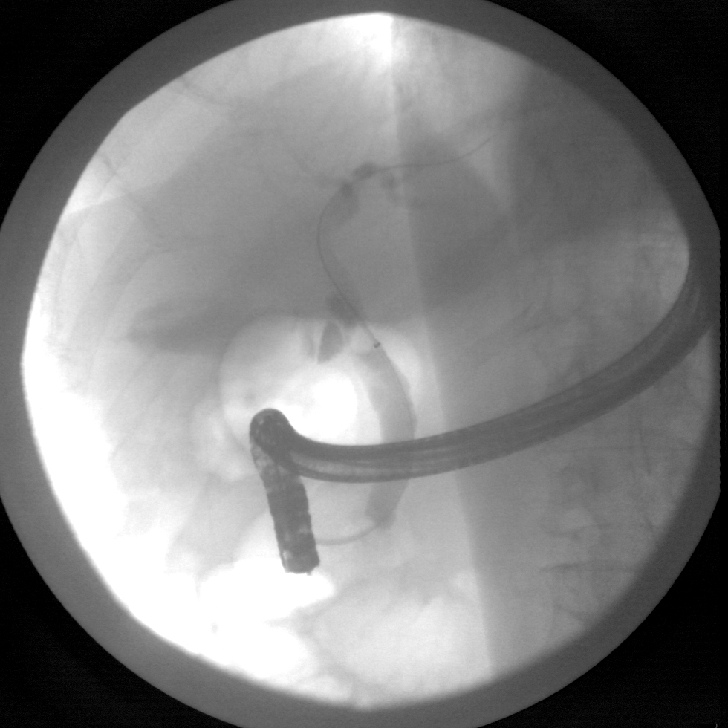
[im 6/8]
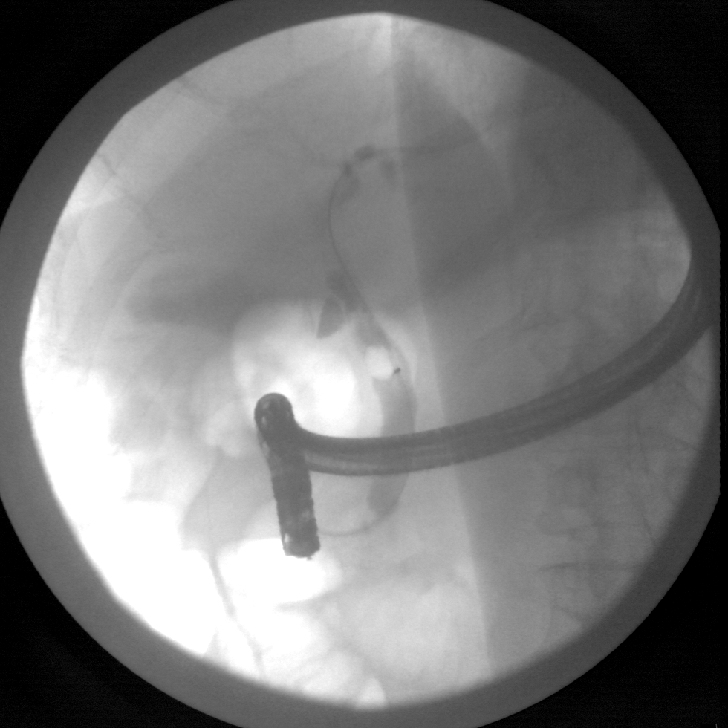
[im 7/8]
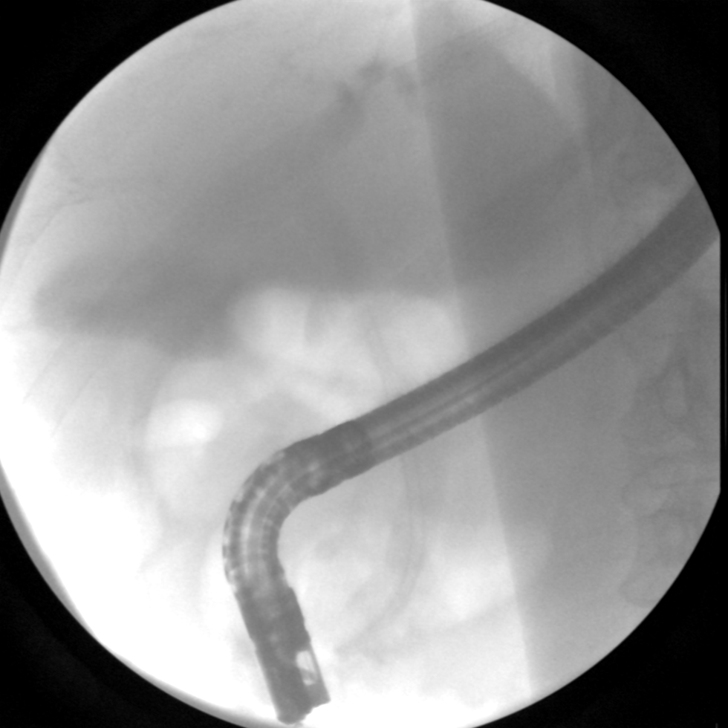
[im 8/8]
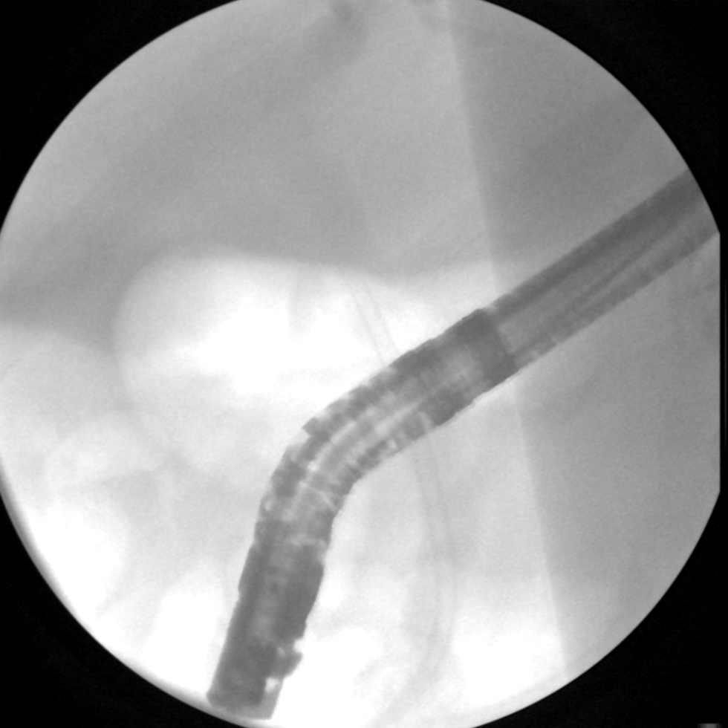

[8 of 8 positions shown; findings below may reference images not displayed]

FINDINGS: A total of 8 intraoperative spot films demonstrate a flexible
endoscope in the descending duodenum with cannulation of the common
bile duct. Cholangiogram demonstrates dilatation of the common bile
duct. There appears to be a small filling defect in the distal
common duct. Subsequent images demonstrate sphincterotomy, balloon
sweep of the common duct and placement of a plastic biliary stent.
IMPRESSION: ERCP with sphincterotomy, balloon sweep of the common bile duct and
placement of a plastic biliary stent.

A small filling defect in the distal common bile duct suggests
choledocholithiasis.

These images were submitted for radiologic interpretation only.
Please see the procedural report for the amount of contrast and the
fluoroscopy time utilized.

## 2017-10-22 DIAGNOSIS — D509 Iron deficiency anemia, unspecified: Secondary | ICD-10-CM | POA: Diagnosis not present

## 2017-10-22 DIAGNOSIS — D631 Anemia in chronic kidney disease: Secondary | ICD-10-CM | POA: Diagnosis not present

## 2017-10-22 DIAGNOSIS — Z992 Dependence on renal dialysis: Secondary | ICD-10-CM | POA: Diagnosis not present

## 2017-10-22 DIAGNOSIS — N186 End stage renal disease: Secondary | ICD-10-CM | POA: Diagnosis not present

## 2017-10-24 DIAGNOSIS — E1122 Type 2 diabetes mellitus with diabetic chronic kidney disease: Secondary | ICD-10-CM | POA: Diagnosis not present

## 2017-10-24 DIAGNOSIS — L89620 Pressure ulcer of left heel, unstageable: Secondary | ICD-10-CM | POA: Diagnosis not present

## 2017-10-24 DIAGNOSIS — L89610 Pressure ulcer of right heel, unstageable: Secondary | ICD-10-CM | POA: Diagnosis not present

## 2017-10-24 DIAGNOSIS — L89890 Pressure ulcer of other site, unstageable: Secondary | ICD-10-CM | POA: Diagnosis not present

## 2017-10-24 DIAGNOSIS — I120 Hypertensive chronic kidney disease with stage 5 chronic kidney disease or end stage renal disease: Secondary | ICD-10-CM | POA: Diagnosis not present

## 2017-10-24 DIAGNOSIS — L89153 Pressure ulcer of sacral region, stage 3: Secondary | ICD-10-CM | POA: Diagnosis not present

## 2017-10-25 DIAGNOSIS — D631 Anemia in chronic kidney disease: Secondary | ICD-10-CM | POA: Diagnosis not present

## 2017-10-25 DIAGNOSIS — N186 End stage renal disease: Secondary | ICD-10-CM | POA: Diagnosis not present

## 2017-10-25 DIAGNOSIS — Z992 Dependence on renal dialysis: Secondary | ICD-10-CM | POA: Diagnosis not present

## 2017-10-25 DIAGNOSIS — D509 Iron deficiency anemia, unspecified: Secondary | ICD-10-CM | POA: Diagnosis not present

## 2017-10-26 ENCOUNTER — Other Ambulatory Visit (HOSPITAL_COMMUNITY): Payer: Self-pay | Admitting: Internal Medicine

## 2017-10-26 ENCOUNTER — Ambulatory Visit (HOSPITAL_COMMUNITY)
Admission: RE | Admit: 2017-10-26 | Discharge: 2017-10-26 | Disposition: A | Discharge status: Home / Self Care | Payer: Medicare Other | Source: Ambulatory Visit | Attending: Internal Medicine | Admitting: Internal Medicine

## 2017-10-26 ENCOUNTER — Ambulatory Visit (INDEPENDENT_AMBULATORY_CARE_PROVIDER_SITE_OTHER): Payer: Medicare Other | Admitting: *Deleted

## 2017-10-26 DIAGNOSIS — R059 Cough, unspecified: Secondary | ICD-10-CM

## 2017-10-26 DIAGNOSIS — A412 Sepsis due to unspecified staphylococcus: Secondary | ICD-10-CM | POA: Diagnosis not present

## 2017-10-26 DIAGNOSIS — R05 Cough: Secondary | ICD-10-CM

## 2017-10-26 DIAGNOSIS — Z1389 Encounter for screening for other disorder: Secondary | ICD-10-CM | POA: Diagnosis not present

## 2017-10-26 DIAGNOSIS — I482 Chronic atrial fibrillation, unspecified: Secondary | ICD-10-CM

## 2017-10-26 DIAGNOSIS — I739 Peripheral vascular disease, unspecified: Secondary | ICD-10-CM | POA: Diagnosis not present

## 2017-10-26 DIAGNOSIS — L89890 Pressure ulcer of other site, unstageable: Secondary | ICD-10-CM | POA: Diagnosis not present

## 2017-10-26 DIAGNOSIS — Z7901 Long term (current) use of anticoagulants: Secondary | ICD-10-CM

## 2017-10-26 DIAGNOSIS — I120 Hypertensive chronic kidney disease with stage 5 chronic kidney disease or end stage renal disease: Secondary | ICD-10-CM | POA: Diagnosis not present

## 2017-10-26 DIAGNOSIS — E1165 Type 2 diabetes mellitus with hyperglycemia: Secondary | ICD-10-CM | POA: Diagnosis not present

## 2017-10-26 DIAGNOSIS — I5042 Chronic combined systolic (congestive) and diastolic (congestive) heart failure: Secondary | ICD-10-CM | POA: Diagnosis not present

## 2017-10-26 DIAGNOSIS — L89153 Pressure ulcer of sacral region, stage 3: Secondary | ICD-10-CM | POA: Diagnosis not present

## 2017-10-26 DIAGNOSIS — Z6825 Body mass index (BMI) 25.0-25.9, adult: Secondary | ICD-10-CM | POA: Diagnosis not present

## 2017-10-26 DIAGNOSIS — J90 Pleural effusion, not elsewhere classified: Secondary | ICD-10-CM | POA: Diagnosis not present

## 2017-10-26 DIAGNOSIS — L8990 Pressure ulcer of unspecified site, unspecified stage: Secondary | ICD-10-CM | POA: Diagnosis not present

## 2017-10-26 DIAGNOSIS — L89620 Pressure ulcer of left heel, unstageable: Secondary | ICD-10-CM | POA: Diagnosis not present

## 2017-10-26 DIAGNOSIS — E1122 Type 2 diabetes mellitus with diabetic chronic kidney disease: Secondary | ICD-10-CM | POA: Diagnosis not present

## 2017-10-26 DIAGNOSIS — E663 Overweight: Secondary | ICD-10-CM | POA: Diagnosis not present

## 2017-10-26 DIAGNOSIS — L89610 Pressure ulcer of right heel, unstageable: Secondary | ICD-10-CM | POA: Diagnosis not present

## 2017-10-26 LAB — POCT INR: INR: 3.6

## 2017-10-28 DIAGNOSIS — I120 Hypertensive chronic kidney disease with stage 5 chronic kidney disease or end stage renal disease: Secondary | ICD-10-CM | POA: Diagnosis not present

## 2017-10-28 DIAGNOSIS — L89620 Pressure ulcer of left heel, unstageable: Secondary | ICD-10-CM | POA: Diagnosis not present

## 2017-10-28 DIAGNOSIS — L89890 Pressure ulcer of other site, unstageable: Secondary | ICD-10-CM | POA: Diagnosis not present

## 2017-10-28 DIAGNOSIS — E1122 Type 2 diabetes mellitus with diabetic chronic kidney disease: Secondary | ICD-10-CM | POA: Diagnosis not present

## 2017-10-28 DIAGNOSIS — L89153 Pressure ulcer of sacral region, stage 3: Secondary | ICD-10-CM | POA: Diagnosis not present

## 2017-10-28 DIAGNOSIS — L89610 Pressure ulcer of right heel, unstageable: Secondary | ICD-10-CM | POA: Diagnosis not present

## 2017-10-29 DIAGNOSIS — N186 End stage renal disease: Secondary | ICD-10-CM | POA: Diagnosis not present

## 2017-10-29 DIAGNOSIS — D631 Anemia in chronic kidney disease: Secondary | ICD-10-CM | POA: Diagnosis not present

## 2017-10-29 DIAGNOSIS — D509 Iron deficiency anemia, unspecified: Secondary | ICD-10-CM | POA: Diagnosis not present

## 2017-10-29 DIAGNOSIS — Z992 Dependence on renal dialysis: Secondary | ICD-10-CM | POA: Diagnosis not present

## 2017-10-31 ENCOUNTER — Ambulatory Visit (INDEPENDENT_AMBULATORY_CARE_PROVIDER_SITE_OTHER): Payer: Medicare Other | Admitting: *Deleted

## 2017-10-31 DIAGNOSIS — Z7901 Long term (current) use of anticoagulants: Secondary | ICD-10-CM | POA: Diagnosis not present

## 2017-10-31 DIAGNOSIS — L89890 Pressure ulcer of other site, unstageable: Secondary | ICD-10-CM | POA: Diagnosis not present

## 2017-10-31 DIAGNOSIS — I482 Chronic atrial fibrillation, unspecified: Secondary | ICD-10-CM

## 2017-10-31 DIAGNOSIS — E1122 Type 2 diabetes mellitus with diabetic chronic kidney disease: Secondary | ICD-10-CM | POA: Diagnosis not present

## 2017-10-31 DIAGNOSIS — L89610 Pressure ulcer of right heel, unstageable: Secondary | ICD-10-CM | POA: Diagnosis not present

## 2017-10-31 DIAGNOSIS — L89153 Pressure ulcer of sacral region, stage 3: Secondary | ICD-10-CM | POA: Diagnosis not present

## 2017-10-31 DIAGNOSIS — I120 Hypertensive chronic kidney disease with stage 5 chronic kidney disease or end stage renal disease: Secondary | ICD-10-CM | POA: Diagnosis not present

## 2017-10-31 DIAGNOSIS — L89620 Pressure ulcer of left heel, unstageable: Secondary | ICD-10-CM | POA: Diagnosis not present

## 2017-10-31 LAB — POCT INR: INR: 4

## 2017-11-01 ENCOUNTER — Telehealth (HOSPITAL_COMMUNITY): Payer: Self-pay

## 2017-11-01 DIAGNOSIS — N186 End stage renal disease: Secondary | ICD-10-CM | POA: Diagnosis not present

## 2017-11-01 DIAGNOSIS — Z992 Dependence on renal dialysis: Secondary | ICD-10-CM | POA: Diagnosis not present

## 2017-11-01 DIAGNOSIS — D509 Iron deficiency anemia, unspecified: Secondary | ICD-10-CM | POA: Diagnosis not present

## 2017-11-01 DIAGNOSIS — D631 Anemia in chronic kidney disease: Secondary | ICD-10-CM | POA: Diagnosis not present

## 2017-11-01 NOTE — Telephone Encounter (Signed)
CHF Clinic appointment reminder call placed to patient for upcoming post-hospital follow up.  LVMTCB to confirm apt.  Patient also reminded to take all medications as prescribed on the day of his/her appointment and to bring all medications to this appointment.  Advised to call our office for tardiness or cancellations/rescheduling needs.  .Bradley, Megan Genevea  

## 2017-11-02 ENCOUNTER — Ambulatory Visit (HOSPITAL_COMMUNITY)
Admission: RE | Admit: 2017-11-02 | Discharge: 2017-11-02 | Disposition: A | Discharge status: Home / Self Care | Payer: Medicare Other | Source: Ambulatory Visit | Attending: Internal Medicine | Admitting: Internal Medicine

## 2017-11-02 ENCOUNTER — Encounter (HOSPITAL_COMMUNITY): Payer: Self-pay

## 2017-11-02 VITALS — BP 94/46 | HR 70 | Wt 160.0 lb

## 2017-11-02 DIAGNOSIS — I132 Hypertensive heart and chronic kidney disease with heart failure and with stage 5 chronic kidney disease, or end stage renal disease: Secondary | ICD-10-CM | POA: Insufficient documentation

## 2017-11-02 DIAGNOSIS — I482 Chronic atrial fibrillation, unspecified: Secondary | ICD-10-CM

## 2017-11-02 DIAGNOSIS — Z933 Colostomy status: Secondary | ICD-10-CM | POA: Insufficient documentation

## 2017-11-02 DIAGNOSIS — Z95810 Presence of automatic (implantable) cardiac defibrillator: Secondary | ICD-10-CM | POA: Insufficient documentation

## 2017-11-02 DIAGNOSIS — Z8546 Personal history of malignant neoplasm of prostate: Secondary | ICD-10-CM | POA: Diagnosis not present

## 2017-11-02 DIAGNOSIS — G4733 Obstructive sleep apnea (adult) (pediatric): Secondary | ICD-10-CM | POA: Diagnosis not present

## 2017-11-02 DIAGNOSIS — Z85048 Personal history of other malignant neoplasm of rectum, rectosigmoid junction, and anus: Secondary | ICD-10-CM | POA: Insufficient documentation

## 2017-11-02 DIAGNOSIS — Z8701 Personal history of pneumonia (recurrent): Secondary | ICD-10-CM | POA: Diagnosis not present

## 2017-11-02 DIAGNOSIS — N186 End stage renal disease: Secondary | ICD-10-CM | POA: Insufficient documentation

## 2017-11-02 DIAGNOSIS — Z7901 Long term (current) use of anticoagulants: Secondary | ICD-10-CM | POA: Diagnosis not present

## 2017-11-02 DIAGNOSIS — Z955 Presence of coronary angioplasty implant and graft: Secondary | ICD-10-CM | POA: Diagnosis not present

## 2017-11-02 DIAGNOSIS — L89610 Pressure ulcer of right heel, unstageable: Secondary | ICD-10-CM | POA: Diagnosis not present

## 2017-11-02 DIAGNOSIS — K219 Gastro-esophageal reflux disease without esophagitis: Secondary | ICD-10-CM | POA: Insufficient documentation

## 2017-11-02 DIAGNOSIS — I2510 Atherosclerotic heart disease of native coronary artery without angina pectoris: Secondary | ICD-10-CM | POA: Insufficient documentation

## 2017-11-02 DIAGNOSIS — I252 Old myocardial infarction: Secondary | ICD-10-CM | POA: Diagnosis not present

## 2017-11-02 DIAGNOSIS — Z9221 Personal history of antineoplastic chemotherapy: Secondary | ICD-10-CM | POA: Diagnosis not present

## 2017-11-02 DIAGNOSIS — I5022 Chronic systolic (congestive) heart failure: Secondary | ICD-10-CM | POA: Insufficient documentation

## 2017-11-02 DIAGNOSIS — L89153 Pressure ulcer of sacral region, stage 3: Secondary | ICD-10-CM | POA: Diagnosis not present

## 2017-11-02 DIAGNOSIS — J90 Pleural effusion, not elsewhere classified: Secondary | ICD-10-CM | POA: Diagnosis not present

## 2017-11-02 DIAGNOSIS — E785 Hyperlipidemia, unspecified: Secondary | ICD-10-CM | POA: Diagnosis not present

## 2017-11-02 DIAGNOSIS — E1122 Type 2 diabetes mellitus with diabetic chronic kidney disease: Secondary | ICD-10-CM | POA: Insufficient documentation

## 2017-11-02 DIAGNOSIS — L89890 Pressure ulcer of other site, unstageable: Secondary | ICD-10-CM | POA: Diagnosis not present

## 2017-11-02 DIAGNOSIS — I255 Ischemic cardiomyopathy: Secondary | ICD-10-CM | POA: Diagnosis not present

## 2017-11-02 DIAGNOSIS — I120 Hypertensive chronic kidney disease with stage 5 chronic kidney disease or end stage renal disease: Secondary | ICD-10-CM | POA: Diagnosis not present

## 2017-11-02 DIAGNOSIS — Z8673 Personal history of transient ischemic attack (TIA), and cerebral infarction without residual deficits: Secondary | ICD-10-CM | POA: Diagnosis not present

## 2017-11-02 DIAGNOSIS — Z923 Personal history of irradiation: Secondary | ICD-10-CM | POA: Insufficient documentation

## 2017-11-02 DIAGNOSIS — Z8249 Family history of ischemic heart disease and other diseases of the circulatory system: Secondary | ICD-10-CM | POA: Insufficient documentation

## 2017-11-02 DIAGNOSIS — I5020 Unspecified systolic (congestive) heart failure: Secondary | ICD-10-CM | POA: Diagnosis present

## 2017-11-02 DIAGNOSIS — Z79899 Other long term (current) drug therapy: Secondary | ICD-10-CM | POA: Insufficient documentation

## 2017-11-02 DIAGNOSIS — L89620 Pressure ulcer of left heel, unstageable: Secondary | ICD-10-CM | POA: Diagnosis not present

## 2017-11-02 DIAGNOSIS — Z992 Dependence on renal dialysis: Secondary | ICD-10-CM | POA: Diagnosis not present

## 2017-11-02 NOTE — Progress Notes (Signed)
Advanced Heart Failure Medication Review by a Pharmacist  Does the patient  feel that his/her medications are working for him/her?  yes  Has the patient been experiencing any side effects to the medications prescribed?  no  Does the patient measure his/her own blood pressure or blood glucose at home?  Not asked at this visit   Does the patient have any problems obtaining medications due to transportation or finances?   no  Understanding of regimen: fair Understanding of indications: fair Potential of compliance: excellent Patient understands to avoid NSAIDs. Patient understands to avoid decongestants.  Issues to address at subsequent visits: none   Pharmacist comments: Mr Aldredge is a pleasant 61 year old Caucasian male presenting to clinic today with his son and medication bottles. His wife manages his medications for him. He reports taking his morning doses of medications and that he is adherent to his current regimen. He did express concern about weight loss over the past few weeks. He has no medication related questions or concerns at this time.   Time with patient: 10 Preparation and documentation time: 2 Total time: 12

## 2017-11-02 NOTE — Addendum Note (Signed)
Encounter addended by: Shirley Friar, PA-C on: 11/02/2017 1:33 PM  Actions taken: Sign clinical note

## 2017-11-02 NOTE — Patient Instructions (Signed)
You have been scheduled for a thoracentesis at [facility] Please arrive at the main entrance of [facility] on [address] to check in Monday December 3rd at 12:30 pm.  Follow up 2 months with Dr. Haroldine Laws.  Take all medication as prescribed the day of your appointment. Bring all medications with you to your appointment.  Do the following things EVERYDAY: 1) Weigh yourself in the morning before breakfast. Write it down and keep it in a log. 2) Take your medicines as prescribed 3) Eat low salt foods-Limit salt (sodium) to 2000 mg per day.  4) Stay as active as you can everyday 5) Limit all fluids for the day to less than 2 liters

## 2017-11-02 NOTE — Progress Notes (Addendum)
Patient ID: [name], male   DOB: August 27, 1956, 61 y.o.   MRN: [id number]    Advanced Heart Failure Clinic Note   PCP: Dr. Gerarda Fraction Oncologist: Dr Benay Spice.  CHF: Bensimhon  61 yo with history of CAD and systolic HF due to ischemic cardiomyopathy EF 20-25%, BiV ICD upgrade 2009, chronic atrial fibrillation s/p AVN ablation 9/16, CKD, and traumatic SAH in 1/16 after a fall.  He has been followed at the heart failure clinic at [facility] by Dr Carolynn Serve in the past.   He was admitted in 3/16 from [facility] with exertional dyspnea/volume overload and had a cardiac arrest/ventricular fibrillation while in the hospital terminated by his ICD. After diuresis, RHC showed relatively normal filling pressures and preserved cardiac index.  Creatinine peaked at 3.0. (was 5.0 in 1/16).  Echo in 3/16 showed EF 25-30% with restrictive diastolic function.  Cardiolite was done, showing areas of scar but no ischemia.  EF 18%.    Admitted 4/16 with hematuria and elevated LFTs. Questionable amiodarone toxicity and this was stopped.   In 5/16 underwent elective BiV ICD generator change which went without complication. He was admitted again 04/23/15 with acute on chronic CHF and respiratory failure. During that admission he ruled in for a NSTEMI- Troponin 4.29. He has chronic stage 4 CKD and it was decided to obtain a Myoview before considering a cath. Myoview 04/26/15 showed EF 20% with large region of prior infarct involving the apical anterior, anteroseptal, inferoseptal and inferior walls with extension to the true apex. Small region of reversible/inducible ischemia along the margin of the prior infarct at the mid ventricular anterior and anteroseptal walls. No change from previous.   Redmitted in 9/16 with bleeding from stoma site and atrial fibrillation with RVR.  Eliquis was held and bleeding stopped.  Eliquis was restarted prior to discharge. He was noted to have < 50% BiV pacing and also to be very volume overloaded. He  was then diuresed and had AV nodal ablation.    Readmitted 7/17 with cholangitis and sepsis. Required ERCP with biliary stent. C/b acute on chronic renal failure requiring HD.   Had prolonged hospitalization in 8/18 for MSSA bacteremia/sepsis c/b shock and worsening renal failure. Developed ESRD and HD started. TEE negative for vegetation. EF 15-20%. RV down. Hospitalization c/b severe debility, malnutrition and skin wounds. Was d/c'd to CIR for several weeks. Completed IV ANCEF course.   Pt returns today for regular scheduled follow up, but has also been in hospital treated for PNA. Feeling much better, but has pleural effusion per Dr. Visit and CXR earlier this week. Pt was told to go to [facility] but prefers [facility], and knew he had a visit here coming up. + orthopnea. Remains very week. Getting HF Tu/Th/Sat. Using a walker to get around the house.   Corevue: Thoracic impedence elevated over past 8 days, but now trending towards normal. Chronic Afib.    Labs (12/15): LDL 123 Labs (3/16): K 3.8, creatinine 2.78 Labs (03/10/2015): K 4.5 Creatinine 2.49  TSH 4.58, LFTs normal, digoxin 0.8 Labs (04/26/15): K 3.5 creatinine 2.13 Labs (08/04/2015): K 3.3 Creatinine 3.01  Labs (9/16): K 4.1, creatinine 2.7, HCT 30.8 Labs (4/17): K 3.5, creatinine 2.67 Labs (05/12/16): K 3.7, creatinine 2.66 Labs (10/20/16): K 3.6, creatinine 2.71 Labs (01/26/17) K 3.1, creatinine 2.70  PMH: 1. HTN 2. Type II diabetes 3. CAD: s/p BMS LAD in 2001, PTCA ramus and BMS LAD in 2009.  Cardiolite (3/16) with EF 18%, no ischemia, prior anterior,  apical and inferior infarction. NSTEMI in 5/16.  Myoview done 04/26/15 showed EF 20% with large region of prior infarct involving the apical anterior, anteroseptal, inferoseptal and inferior walls with extension to the true apex. Small region of reversible/inducible ischemia along the margin of the prior infarct at the mid ventricular anterior and anteroseptal walls. No change from previous.  4.  Chronic systolic CHF: Ischemic CMP.  St Jude CRT-D.  Echo (3/16) with EF 25-30%, restrictive diastolic function, mild LVH, mild MR.  RHC (3/16) with mean RA 9, PA 47/29, mean PCWP 16, CI 2.5 (Fick).  Suspect ACEI cough. Echo (7/17) with EF 15-20%. Myoview 18%.   5. SAH: 1/16 after fall (traumatic).   6. GERD 7. Atrial fibrillation: Paroxysmal initially but now chronic.  Now s/p AV nodal ablation to allow BiV pacing. 8. Rectal cancer: s/p surgery. Has colostomy.  9. Prostate cancer: s/p chemo/radiation.  10. H/o TIA 11. OSA: On CPAP.  12. Hyperlipidemia 13. CKD stage IV: Followed by Dr Lowanda Foster.  14. Bleeding from stoma.   SH: Married, lives in [place], nonsmoker.   FH: CAD  Review of systems complete and found to be negative unless listed in HPI.    Current Outpatient Medications  Medication Sig Dispense Refill  . cephALEXin (KEFLEX) 250 MG capsule Take 250 capsules by mouth 2 (two) times daily. Staph infection    . colchicine 0.6 MG tablet Take 0.5 tablets (0.3 mg total) by mouth 2 (two) times a week. 30 tablet 0  . HYDROcodone-acetaminophen (NORCO) 7.5-325 MG tablet Take 1-2 tablets by mouth every 4 (four) hours as needed for moderate pain. 10 tablet 0  . midodrine (PROAMATINE) 10 MG tablet Take 1 tablet (10 mg total) by mouth 3 (three) times daily with meals. 90 tablet 0  . multivitamin (RENA-VIT) TABS tablet Take 1 tablet by mouth at bedtime. 30 tablet 0  . pantoprazole (PROTONIX) 40 MG tablet Take 1 tablet (40 mg total) by mouth daily. 30 tablet 0  . sucroferric oxyhydroxide (VELPHORO) 500 MG chewable tablet Chew 1 tablet (500 mg total) by mouth 3 (three) times daily with meals. 90 tablet 0  . warfarin (COUMADIN) 1 MG tablet Take 0.5 tablets (0.5 mg total) by mouth one time only at 6 PM. 30 tablet 0  . Amino Acids-Protein Hydrolys (FEEDING SUPPLEMENT, PRO-STAT SUGAR FREE 64,) LIQD Take 30 mLs by mouth 3 (three) times daily. 900 mL 0  . collagenase (SANTYL) ointment Apply  topically daily. Applied to affected areas directed 15 g 0  . glucose blood test strip Use to test blood sugar 3 times daily 100 each 4  . guaiFENesin (MUCINEX) 600 MG 12 hr tablet Take 2 tablets (1,200 mg total) by mouth 2 (two) times daily. 20 tablet 0  . loratadine (CLARITIN) 10 MG tablet Take 1 tablet (10 mg total) by mouth daily. 30 tablet 0  . nitroGLYCERIN (NITROSTAT) 0.4 MG SL tablet Place 1 tablet (0.4 mg total) under the tongue every 5 (five) minutes as needed for chest pain. 25 tablet 3  . Nutritional Supplements (FEEDING SUPPLEMENT, NEPRO CARB STEADY,) LIQD Take 237 mLs by mouth 2 (two) times daily between meals. 3 Can 0  . polyethylene glycol (MIRALAX / GLYCOLAX) packet Take 17 g by mouth daily as needed for mild constipation. 14 each 0  . PROAIR RESPICLICK 299 (90 BASE) MCG/ACT AEPB Inhale 1 puff into the lungs every 6 (six) hours as needed (for breathing).   0   No current facility-administered medications for this encounter.  Vitals:   11/02/17 1000  BP: (!) 94/46  Pulse: 70  SpO2: 100%  Weight: 160 lb (72.6 kg)   Wt Readings from Last 3 Encounters:  11/02/17 160 lb (72.6 kg)  09/14/17 155 lb (70.3 kg)  09/01/17 170 lb 6.4 oz (77.3 kg)   Physical Exam General: Cachetic. Weak appearing. No resp difficulty. HEENT: Normal Neck: Supple. JVP 8-9 cm. Carotids 2+ bilat; no bruits. No thyromegaly or nodule noted. Cor: PMI lateral. RRR, 2/6 MR/TR Lungs: R lung dull to mid. L with mild basilar crackles.  Abdomen: Soft, non-tender, non-distended, no HSM. No bruits or masses. +BS. LLQ ostomy bag.  Extremities: No cyanosis, clubbing, or rash. Trace to 1+ chronic edema. Healing wounds.  Neuro: Alert & orientedx3, cranial nerves grossly intact. moves all 4 extremities w/o difficulty. Affect pleasant   Assessment/Plan: 1. Chronic systolic CHF: Ischemic cardiomyopathy.  - Echo 07/2017 with LVEF 15-20%, - He has end-stage HF, NYHA IIIB-IV and is now dialysis dependent - Volume  status mildly elevated but improving on HD.  - We previously discussed Hospice but he is not interested. - Continue HHPT for now. May need to readdress GOC. Not candidate for advanced therapies.  - Off HF meds due to ESRD and low BP. Continue midodrine 2. Recent HCAP - Finished ABX.  3. Atrial fibrillation: Chronic. CHADS2Vasc Score 5. s/p AV nodal ablation -  s/p AV node ablation.  - On warfarin. Denies bleeding.  4. ESRD - As above. Volume status elevated. Push dry weight as possible.  - Continue midodrine  15 mg BID per renal.  5. CAD:  NSTEMI in 5/16.  - Cardiolite without high ischemic burden. EF is decreased from previous, but poor cath candidate with renal failure. - No ASA given AC - No s/s of ischemia.  6. H/O rectal cancer 2008: Has diverting colostomy. Follows with Dr. Benay Spice.  - CT (05/02/16) without evidence of reoccurrence.  7. Pleural effusion  - Has pleural effusion by CXR 10/27/17, R lung base very dull on exam. He refuses ED and would like to have done as outpatient if possible. He feels near his baseline.   Prognosis Guarded. Discussion as above. Thoracentesis for pleural effusion as instructed by PCP. Further per PCP.  RTC 2 months. Sooner with symptoms. Pt knows to report to ED with any worsening symptoms prior to thoracentesis.    Shirley Friar, PA-C  11/02/17   Greater than 50% of the 25 minute visit was spent in counseling/coordination of care regarding disease state education, salt/fluid restriction, sliding scale diuretics, and medication compliance.

## 2017-11-03 DIAGNOSIS — D509 Iron deficiency anemia, unspecified: Secondary | ICD-10-CM | POA: Diagnosis not present

## 2017-11-03 DIAGNOSIS — D631 Anemia in chronic kidney disease: Secondary | ICD-10-CM | POA: Diagnosis not present

## 2017-11-03 DIAGNOSIS — L89890 Pressure ulcer of other site, unstageable: Secondary | ICD-10-CM | POA: Diagnosis not present

## 2017-11-03 DIAGNOSIS — E1122 Type 2 diabetes mellitus with diabetic chronic kidney disease: Secondary | ICD-10-CM | POA: Diagnosis not present

## 2017-11-03 DIAGNOSIS — I120 Hypertensive chronic kidney disease with stage 5 chronic kidney disease or end stage renal disease: Secondary | ICD-10-CM | POA: Diagnosis not present

## 2017-11-03 DIAGNOSIS — L89620 Pressure ulcer of left heel, unstageable: Secondary | ICD-10-CM | POA: Diagnosis not present

## 2017-11-03 DIAGNOSIS — Z992 Dependence on renal dialysis: Secondary | ICD-10-CM | POA: Diagnosis not present

## 2017-11-03 DIAGNOSIS — L89153 Pressure ulcer of sacral region, stage 3: Secondary | ICD-10-CM | POA: Diagnosis not present

## 2017-11-03 DIAGNOSIS — N186 End stage renal disease: Secondary | ICD-10-CM | POA: Diagnosis not present

## 2017-11-03 DIAGNOSIS — L89610 Pressure ulcer of right heel, unstageable: Secondary | ICD-10-CM | POA: Diagnosis not present

## 2017-11-04 DIAGNOSIS — L89890 Pressure ulcer of other site, unstageable: Secondary | ICD-10-CM | POA: Diagnosis not present

## 2017-11-04 DIAGNOSIS — L89620 Pressure ulcer of left heel, unstageable: Secondary | ICD-10-CM | POA: Diagnosis not present

## 2017-11-04 DIAGNOSIS — L89153 Pressure ulcer of sacral region, stage 3: Secondary | ICD-10-CM | POA: Diagnosis not present

## 2017-11-04 DIAGNOSIS — E1122 Type 2 diabetes mellitus with diabetic chronic kidney disease: Secondary | ICD-10-CM | POA: Diagnosis not present

## 2017-11-04 DIAGNOSIS — I120 Hypertensive chronic kidney disease with stage 5 chronic kidney disease or end stage renal disease: Secondary | ICD-10-CM | POA: Diagnosis not present

## 2017-11-04 DIAGNOSIS — Z992 Dependence on renal dialysis: Secondary | ICD-10-CM | POA: Diagnosis not present

## 2017-11-04 DIAGNOSIS — N186 End stage renal disease: Secondary | ICD-10-CM | POA: Diagnosis not present

## 2017-11-04 DIAGNOSIS — L89610 Pressure ulcer of right heel, unstageable: Secondary | ICD-10-CM | POA: Diagnosis not present

## 2017-11-05 DIAGNOSIS — N186 End stage renal disease: Secondary | ICD-10-CM | POA: Diagnosis not present

## 2017-11-05 DIAGNOSIS — D631 Anemia in chronic kidney disease: Secondary | ICD-10-CM | POA: Diagnosis not present

## 2017-11-05 DIAGNOSIS — D509 Iron deficiency anemia, unspecified: Secondary | ICD-10-CM | POA: Diagnosis not present

## 2017-11-05 DIAGNOSIS — Z992 Dependence on renal dialysis: Secondary | ICD-10-CM | POA: Diagnosis not present

## 2017-11-05 DIAGNOSIS — N2581 Secondary hyperparathyroidism of renal origin: Secondary | ICD-10-CM | POA: Diagnosis not present

## 2017-11-07 ENCOUNTER — Ambulatory Visit (HOSPITAL_COMMUNITY)
Admission: RE | Admit: 2017-11-07 | Discharge: 2017-11-07 | Disposition: A | Discharge status: Home / Self Care | Payer: Medicare Other | Source: Ambulatory Visit | Attending: Student | Admitting: Student

## 2017-11-07 ENCOUNTER — Encounter (HOSPITAL_COMMUNITY): Payer: Self-pay | Admitting: Student

## 2017-11-07 ENCOUNTER — Other Ambulatory Visit (HOSPITAL_COMMUNITY): Payer: Self-pay | Admitting: Student

## 2017-11-07 ENCOUNTER — Ambulatory Visit (INDEPENDENT_AMBULATORY_CARE_PROVIDER_SITE_OTHER): Payer: Medicare Other | Admitting: *Deleted

## 2017-11-07 DIAGNOSIS — L89620 Pressure ulcer of left heel, unstageable: Secondary | ICD-10-CM | POA: Diagnosis not present

## 2017-11-07 DIAGNOSIS — I482 Chronic atrial fibrillation, unspecified: Secondary | ICD-10-CM

## 2017-11-07 DIAGNOSIS — L89610 Pressure ulcer of right heel, unstageable: Secondary | ICD-10-CM | POA: Diagnosis not present

## 2017-11-07 DIAGNOSIS — J90 Pleural effusion, not elsewhere classified: Secondary | ICD-10-CM | POA: Insufficient documentation

## 2017-11-07 DIAGNOSIS — J948 Other specified pleural conditions: Secondary | ICD-10-CM | POA: Diagnosis not present

## 2017-11-07 DIAGNOSIS — E1122 Type 2 diabetes mellitus with diabetic chronic kidney disease: Secondary | ICD-10-CM | POA: Diagnosis not present

## 2017-11-07 DIAGNOSIS — Z9889 Other specified postprocedural states: Secondary | ICD-10-CM

## 2017-11-07 DIAGNOSIS — I120 Hypertensive chronic kidney disease with stage 5 chronic kidney disease or end stage renal disease: Secondary | ICD-10-CM | POA: Diagnosis not present

## 2017-11-07 DIAGNOSIS — Z7901 Long term (current) use of anticoagulants: Secondary | ICD-10-CM | POA: Diagnosis not present

## 2017-11-07 DIAGNOSIS — L89890 Pressure ulcer of other site, unstageable: Secondary | ICD-10-CM | POA: Diagnosis not present

## 2017-11-07 DIAGNOSIS — L89153 Pressure ulcer of sacral region, stage 3: Secondary | ICD-10-CM | POA: Diagnosis not present

## 2017-11-07 HISTORY — PX: IR THORACENTESIS RIGHT ASP PLEURAL SPACE W/IMG GUIDE: IMG5380

## 2017-11-07 LAB — BODY FLUID CELL COUNT WITH DIFFERENTIAL
EOS FL: 0 %
Lymphs, Fluid: 56 %
MONOCYTE-MACROPHAGE-SEROUS FLUID: 31 % — AB (ref 50–90)
NEUTROPHIL FLUID: 12 % (ref 0–25)
WBC FLUID: 62 uL (ref 0–1000)

## 2017-11-07 LAB — POCT INR: INR: 1.7

## 2017-11-07 LAB — GRAM STAIN

## 2017-11-07 LAB — LACTATE DEHYDROGENASE, PLEURAL OR PERITONEAL FLUID: LD, Fluid: 97 U/L — ABNORMAL HIGH (ref 3–23)

## 2017-11-07 LAB — PROTEIN, PLEURAL OR PERITONEAL FLUID: Total protein, fluid: <3 g/dL

## 2017-11-07 MED ORDER — LIDOCAINE 2% (20 MG/ML) 5 ML SYRINGE
INTRAMUSCULAR | Status: AC
  Filled 2017-11-07: qty 10

## 2017-11-07 MED ORDER — LIDOCAINE HCL (PF) 1 % IJ SOLN
INTRAMUSCULAR | Status: DC | PRN
  Administered 2017-11-07: 8 mL

## 2017-11-07 NOTE — Procedures (Signed)
PROCEDURE SUMMARY:  Successful US guided right diagnostic and therapeutic thoracentesis. Yielded 1.8 liters of amber fluid. Pt tolerated procedure well. No immediate complications.  Specimen was sent for labs. CXR ordered.  Docia Barrier PA-C 11/07/2017 1:56 PM

## 2017-11-08 DIAGNOSIS — Z992 Dependence on renal dialysis: Secondary | ICD-10-CM | POA: Diagnosis not present

## 2017-11-08 DIAGNOSIS — N186 End stage renal disease: Secondary | ICD-10-CM | POA: Diagnosis not present

## 2017-11-08 DIAGNOSIS — D509 Iron deficiency anemia, unspecified: Secondary | ICD-10-CM | POA: Diagnosis not present

## 2017-11-08 DIAGNOSIS — D631 Anemia in chronic kidney disease: Secondary | ICD-10-CM | POA: Diagnosis not present

## 2017-11-08 DIAGNOSIS — N2581 Secondary hyperparathyroidism of renal origin: Secondary | ICD-10-CM | POA: Diagnosis not present

## 2017-11-09 DIAGNOSIS — I120 Hypertensive chronic kidney disease with stage 5 chronic kidney disease or end stage renal disease: Secondary | ICD-10-CM | POA: Diagnosis not present

## 2017-11-09 DIAGNOSIS — L89610 Pressure ulcer of right heel, unstageable: Secondary | ICD-10-CM | POA: Diagnosis not present

## 2017-11-09 DIAGNOSIS — L89620 Pressure ulcer of left heel, unstageable: Secondary | ICD-10-CM | POA: Diagnosis not present

## 2017-11-09 DIAGNOSIS — L89890 Pressure ulcer of other site, unstageable: Secondary | ICD-10-CM | POA: Diagnosis not present

## 2017-11-09 DIAGNOSIS — L89153 Pressure ulcer of sacral region, stage 3: Secondary | ICD-10-CM | POA: Diagnosis not present

## 2017-11-09 DIAGNOSIS — E1122 Type 2 diabetes mellitus with diabetic chronic kidney disease: Secondary | ICD-10-CM | POA: Diagnosis not present

## 2017-11-10 DIAGNOSIS — N186 End stage renal disease: Secondary | ICD-10-CM | POA: Diagnosis not present

## 2017-11-10 DIAGNOSIS — D509 Iron deficiency anemia, unspecified: Secondary | ICD-10-CM | POA: Diagnosis not present

## 2017-11-10 DIAGNOSIS — D631 Anemia in chronic kidney disease: Secondary | ICD-10-CM | POA: Diagnosis not present

## 2017-11-10 DIAGNOSIS — N2581 Secondary hyperparathyroidism of renal origin: Secondary | ICD-10-CM | POA: Diagnosis not present

## 2017-11-10 DIAGNOSIS — Z992 Dependence on renal dialysis: Secondary | ICD-10-CM | POA: Diagnosis not present

## 2017-11-11 DIAGNOSIS — I120 Hypertensive chronic kidney disease with stage 5 chronic kidney disease or end stage renal disease: Secondary | ICD-10-CM | POA: Diagnosis not present

## 2017-11-11 DIAGNOSIS — E1122 Type 2 diabetes mellitus with diabetic chronic kidney disease: Secondary | ICD-10-CM | POA: Diagnosis not present

## 2017-11-11 DIAGNOSIS — L89610 Pressure ulcer of right heel, unstageable: Secondary | ICD-10-CM | POA: Diagnosis not present

## 2017-11-11 DIAGNOSIS — L89153 Pressure ulcer of sacral region, stage 3: Secondary | ICD-10-CM | POA: Diagnosis not present

## 2017-11-11 DIAGNOSIS — L89620 Pressure ulcer of left heel, unstageable: Secondary | ICD-10-CM | POA: Diagnosis not present

## 2017-11-11 DIAGNOSIS — L89890 Pressure ulcer of other site, unstageable: Secondary | ICD-10-CM | POA: Diagnosis not present

## 2017-11-12 DIAGNOSIS — N2581 Secondary hyperparathyroidism of renal origin: Secondary | ICD-10-CM | POA: Diagnosis not present

## 2017-11-12 DIAGNOSIS — N186 End stage renal disease: Secondary | ICD-10-CM | POA: Diagnosis not present

## 2017-11-12 DIAGNOSIS — D631 Anemia in chronic kidney disease: Secondary | ICD-10-CM | POA: Diagnosis not present

## 2017-11-12 DIAGNOSIS — D509 Iron deficiency anemia, unspecified: Secondary | ICD-10-CM | POA: Diagnosis not present

## 2017-11-12 DIAGNOSIS — Z992 Dependence on renal dialysis: Secondary | ICD-10-CM | POA: Diagnosis not present

## 2017-11-12 LAB — CULTURE, BODY FLUID-BOTTLE: CULTURE: NO GROWTH

## 2017-11-12 LAB — CULTURE, BODY FLUID W GRAM STAIN -BOTTLE

## 2017-11-16 ENCOUNTER — Ambulatory Visit (INDEPENDENT_AMBULATORY_CARE_PROVIDER_SITE_OTHER): Payer: Medicare Other | Admitting: *Deleted

## 2017-11-16 ENCOUNTER — Telehealth: Payer: Self-pay | Admitting: *Deleted

## 2017-11-16 DIAGNOSIS — L89620 Pressure ulcer of left heel, unstageable: Secondary | ICD-10-CM | POA: Diagnosis not present

## 2017-11-16 DIAGNOSIS — Z7901 Long term (current) use of anticoagulants: Secondary | ICD-10-CM

## 2017-11-16 DIAGNOSIS — I120 Hypertensive chronic kidney disease with stage 5 chronic kidney disease or end stage renal disease: Secondary | ICD-10-CM | POA: Diagnosis not present

## 2017-11-16 DIAGNOSIS — I482 Chronic atrial fibrillation, unspecified: Secondary | ICD-10-CM

## 2017-11-16 DIAGNOSIS — E1122 Type 2 diabetes mellitus with diabetic chronic kidney disease: Secondary | ICD-10-CM | POA: Diagnosis not present

## 2017-11-16 DIAGNOSIS — L89890 Pressure ulcer of other site, unstageable: Secondary | ICD-10-CM | POA: Diagnosis not present

## 2017-11-16 DIAGNOSIS — L89153 Pressure ulcer of sacral region, stage 3: Secondary | ICD-10-CM | POA: Diagnosis not present

## 2017-11-16 DIAGNOSIS — L89610 Pressure ulcer of right heel, unstageable: Secondary | ICD-10-CM | POA: Diagnosis not present

## 2017-11-16 LAB — POCT INR: INR: 1.5

## 2017-11-16 NOTE — Telephone Encounter (Signed)
LMOM for [name] with coumadin instructions.  See coumadin note.

## 2017-11-16 NOTE — Telephone Encounter (Signed)
[name] w/ [facility] [phone]   INR 1.5  PT 18.2

## 2017-11-17 ENCOUNTER — Ambulatory Visit: Payer: Medicare Other | Admitting: Internal Medicine

## 2017-11-17 DIAGNOSIS — N186 End stage renal disease: Secondary | ICD-10-CM | POA: Diagnosis not present

## 2017-11-17 DIAGNOSIS — D509 Iron deficiency anemia, unspecified: Secondary | ICD-10-CM | POA: Diagnosis not present

## 2017-11-17 DIAGNOSIS — L89620 Pressure ulcer of left heel, unstageable: Secondary | ICD-10-CM | POA: Diagnosis not present

## 2017-11-17 DIAGNOSIS — L89890 Pressure ulcer of other site, unstageable: Secondary | ICD-10-CM | POA: Diagnosis not present

## 2017-11-17 DIAGNOSIS — I120 Hypertensive chronic kidney disease with stage 5 chronic kidney disease or end stage renal disease: Secondary | ICD-10-CM | POA: Diagnosis not present

## 2017-11-17 DIAGNOSIS — L89610 Pressure ulcer of right heel, unstageable: Secondary | ICD-10-CM | POA: Diagnosis not present

## 2017-11-17 DIAGNOSIS — E1122 Type 2 diabetes mellitus with diabetic chronic kidney disease: Secondary | ICD-10-CM | POA: Diagnosis not present

## 2017-11-17 DIAGNOSIS — N2581 Secondary hyperparathyroidism of renal origin: Secondary | ICD-10-CM | POA: Diagnosis not present

## 2017-11-17 DIAGNOSIS — L89153 Pressure ulcer of sacral region, stage 3: Secondary | ICD-10-CM | POA: Diagnosis not present

## 2017-11-17 DIAGNOSIS — D631 Anemia in chronic kidney disease: Secondary | ICD-10-CM | POA: Diagnosis not present

## 2017-11-17 DIAGNOSIS — Z992 Dependence on renal dialysis: Secondary | ICD-10-CM | POA: Diagnosis not present

## 2017-11-18 DIAGNOSIS — L89153 Pressure ulcer of sacral region, stage 3: Secondary | ICD-10-CM | POA: Diagnosis not present

## 2017-11-18 DIAGNOSIS — I120 Hypertensive chronic kidney disease with stage 5 chronic kidney disease or end stage renal disease: Secondary | ICD-10-CM | POA: Diagnosis not present

## 2017-11-18 DIAGNOSIS — L89620 Pressure ulcer of left heel, unstageable: Secondary | ICD-10-CM | POA: Diagnosis not present

## 2017-11-18 DIAGNOSIS — E1122 Type 2 diabetes mellitus with diabetic chronic kidney disease: Secondary | ICD-10-CM | POA: Diagnosis not present

## 2017-11-18 DIAGNOSIS — L89890 Pressure ulcer of other site, unstageable: Secondary | ICD-10-CM | POA: Diagnosis not present

## 2017-11-18 DIAGNOSIS — L89610 Pressure ulcer of right heel, unstageable: Secondary | ICD-10-CM | POA: Diagnosis not present

## 2017-11-19 DIAGNOSIS — D631 Anemia in chronic kidney disease: Secondary | ICD-10-CM | POA: Diagnosis not present

## 2017-11-19 DIAGNOSIS — D509 Iron deficiency anemia, unspecified: Secondary | ICD-10-CM | POA: Diagnosis not present

## 2017-11-19 DIAGNOSIS — Z992 Dependence on renal dialysis: Secondary | ICD-10-CM | POA: Diagnosis not present

## 2017-11-19 DIAGNOSIS — N186 End stage renal disease: Secondary | ICD-10-CM | POA: Diagnosis not present

## 2017-11-19 DIAGNOSIS — N2581 Secondary hyperparathyroidism of renal origin: Secondary | ICD-10-CM | POA: Diagnosis not present

## 2017-11-21 ENCOUNTER — Ambulatory Visit (INDEPENDENT_AMBULATORY_CARE_PROVIDER_SITE_OTHER): Payer: Medicare Other | Admitting: Internal Medicine

## 2017-11-21 DIAGNOSIS — L89890 Pressure ulcer of other site, unstageable: Secondary | ICD-10-CM | POA: Diagnosis not present

## 2017-11-21 DIAGNOSIS — E1122 Type 2 diabetes mellitus with diabetic chronic kidney disease: Secondary | ICD-10-CM | POA: Diagnosis not present

## 2017-11-21 DIAGNOSIS — I255 Ischemic cardiomyopathy: Secondary | ICD-10-CM

## 2017-11-21 DIAGNOSIS — L89620 Pressure ulcer of left heel, unstageable: Secondary | ICD-10-CM | POA: Diagnosis not present

## 2017-11-21 DIAGNOSIS — L89153 Pressure ulcer of sacral region, stage 3: Secondary | ICD-10-CM | POA: Diagnosis not present

## 2017-11-21 DIAGNOSIS — I120 Hypertensive chronic kidney disease with stage 5 chronic kidney disease or end stage renal disease: Secondary | ICD-10-CM | POA: Diagnosis not present

## 2017-11-21 DIAGNOSIS — A4101 Sepsis due to Methicillin susceptible Staphylococcus aureus: Secondary | ICD-10-CM

## 2017-11-21 DIAGNOSIS — L89610 Pressure ulcer of right heel, unstageable: Secondary | ICD-10-CM | POA: Diagnosis not present

## 2017-11-21 NOTE — Progress Notes (Signed)
[place] for Infectious Disease  Patient Active Problem List   Diagnosis Date Noted  . Staphylococcus aureus bacteremia with sepsis [facility])     Priority: High  . Malnutrition of moderate degree 09/06/2017  . Status post thoracentesis   . Colostomy in place [facility])   . ESRD on dialysis ([place])   . Hyponatremia 09/03/2017  . Hemoptysis 09/03/2017  . Hypoglycemia   . Severe protein-calorie malnutrition ([place])   . Acute blood loss anemia   . Type 2 diabetes mellitus with peripheral neuropathy (HCC)   . Chronic systolic heart failure ([place])   . Colostomy care ([place])   . Chronic kidney disease (CKD), stage IV (severe) ([place])   . Dependence on renal dialysis ([place])   . Wounds, multiple   . Supratherapeutic INR   . MSSA bacteremia 08/02/2017  . ESRD (end stage renal disease) ([place])   . Debility   . Acute on chronic systolic CHF (congestive heart failure) ([place])   . Bacteremia due to methicillin susceptible Staphylococcus aureus (MSSA)   . Infected defibrillator ([place])   . Septic hip ([place])   . Severe sepsis with septic shock ([place])   . Cardiogenic shock ([place]) 07/10/2017  . Septic shock ([place]) 07/10/2017  . Cardiorenal syndrome with renal failure   . Thrombocytopenia ([place]) 06/14/2017  . GI bleed 04/27/2017  . Portal hypertensive gastropathy ([place]) 04/27/2017  . Hypokalemia 04/27/2017  . Esophageal dysphagia 04/13/2017  . Hepatic cirrhosis ([place]) 02/24/2017  . Acute on chronic systolic heart failure ([place])   . Biliary disease with obstruction   . AKI (acute kidney injury) ([place])   . Abnormal LFTs 05/10/2016  . Pain in Robert chest   . Right heart failure ([place]) 09/26/2015  . History of colonic polyps 09/24/2015  . Chronic atrial fibrillation ([place])   . Bleeding from colostomy stoma ([place]) 08/23/2015  . Anemia of chronic disease 07/21/2015  . HCAP (healthcare-associated pneumonia) 07/21/2015  . Constipation 05/01/2015  . Transaminitis 05/01/2015  . NSTEMI- 04/23/15- Myoview scar- no  cath 04/27/2015  . Acute respiratory failure ([place]) 04/23/2015  . Chronic cholecystitis 04/04/2015  . Cystitis with hematuria-April 2016 04/04/2015  . Chronically elevated transaminase level-Amiodarone resumed 04/24/15 04/03/2015  . H/O ventricular fibrillation-March 2016 02/24/2015  . BiV ICD (St Jude) gen change 04/10/15 02/24/2015  . Hyperlipidemia   . Dietary noncompliance   . Orthostatic hypotension 07/19/2014  . OSA on CPAP   . GERD (gastroesophageal reflux disease) 06/05/2014  . Gallbladder polyp 03/06/2014  . DM type 2, uncontrolled, with renal complications ([place]) 72/53/6644  . Dizziness 01/26/2014  . Obesity 12/12/2013  . Chronic hypotension 12/11/2013  . Chronic kidney disease, stage IV (severe) ([place]) 12/11/2013  . Chronic a-fib ([place])   . CAD with LCX disease and stents to LAD 07/10/2013  . Long term current use of anticoagulant therapy 03/23/2013  . History of rectal cancer   . Ischemic cardiomyopathy-EF 35% 04/24/15 echo       Medication List        Accurate as of 11/21/17  4:45 PM. Always use your most recent med list.          cephALEXin 250 MG capsule Commonly known as:  KEFLEX   colchicine 0.6 MG tablet Take 0.5 tablets (0.3 mg total) by mouth 2 (two) times a week.   feeding supplement (PRO-STAT SUGAR FREE 64) Liqd Take 30 mLs by mouth 3 (three) times daily.   fexofenadine 180 MG tablet Commonly known as:  ALLEGRA   glucose blood test strip Use to test blood sugar 3 times daily   HYDROcodone-acetaminophen 7.5-325 MG tablet Commonly known as:  NORCO Take 1-2 tablets by mouth every 4 (four) hours as needed for moderate pain.   midodrine 5 MG tablet Commonly known as:  PROAMATINE   nitroGLYCERIN 0.4 MG SL tablet Commonly known as:  NITROSTAT Place 1 tablet (0.4 mg total) under Robert tongue every 5 (five) minutes as needed for chest pain.   NOVOLOG FLEXPEN 100 UNIT/ML FlexPen Generic drug:  insulin aspart   pantoprazole 40 MG tablet Commonly known  as:  PROTONIX Take 1 tablet (40 mg total) by mouth daily.   PROAIR RESPICLICK 706 (90 Base) MCG/ACT Aepb Generic drug:  Albuterol Sulfate   sucroferric oxyhydroxide 500 MG chewable tablet Commonly known as:  VELPHORO Chew 1 tablet (500 mg total) by mouth 3 (three) times daily with meals.   warfarin 1 MG tablet Commonly known as:  COUMADIN Take as directed by Robert anticoagulation clinic. If you are unsure how to take this medication, talk to your nurse or doctor. Original instructions:  Take 0.5 tablets (0.5 mg total) by mouth one time only at 6 PM.       Subjective: [name] is in with his family for his routine follow-up visit. He completed 6 weeks of IV cefazolin therapy for MSSA bacteremia on 08/20/2017 then started chronic suppressive oral cephalexin. He has an implantable defibrillator. There was no evidence of vegetations on his leads but I elected to continue treatment because his clinical state has been so fragile. He has had no problems tolerating cephalexin. He was readmitted to [facility] hospital from 09/03/2017 to 09/15/2017 with healthcare associated pneumonia and a right pleural effusion. Blood cultures were negative. He underwent right thoracentesis on 09/04/2017. Gram stain and cultures were negative.Upon discharge he was started back on cephalexin. He had repeat thoracentesis on 11/07/2017. Gram stain and cultures were negative again. He developed a sacral decubitus. He feels like he is doing better. His sacral wound is getting much smaller. He is making progress with physical therapy.  Review of Systems: Review of Systems  Constitutional: Negative for chills, diaphoresis and fever.  Gastrointestinal: Negative for abdominal pain, diarrhea, nausea and vomiting.  Musculoskeletal: Positive for back pain. Negative for joint pain.    Past Medical History:  Diagnosis Date  . Adenocarcinoma of rectum ([name])    a. 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 AICD (automatic cardioverter/defibrillator)  present 2002   BI V ICD  . Anemia   . CAD (coronary artery disease)    a. [facility]AD 2001 [facility] b. PTCA/atherectomy ramus and BMS to LAD 2009  . CHF (congestive heart failure) ([place])   . Cholelithiasis 06/2015  . Chronic kidney disease, stage IV (severe) ([place])   . Chronic systolic heart failure ([place])   . Cirrhosis ([place])   . Colostomy in place [facility])   . Dizziness    a. chronic. Admission for this 07/18/2014  . DM type 2, uncontrolled, with renal complications ([place])   . Dysrhythmia   . Essential hypertension, benign   . GERD (gastroesophageal reflux disease)   . HCAP (healthcare-associated pneumonia) 07/21/2015  . Hematuria   . History of blood transfusion    "I've had 2 units so far this year" (09/27/2015)  . HLD (hyperlipidemia)   . Ischemic cardiomyopathy    EF 18% by nuclear study 2016, multiple myocardial infarctions in past    . Myocardial infarction ([place]) 2001  . Obesity   .  Orthostatic hypotension   . OSA on CPAP   . Paroxysmal atrial fibrillation (HCC)    a. on amiodarone, digoxin and Eliquis  . PONV (postoperative nausea and vomiting)   . Presence of permanent cardiac pacemaker   . Prostate cancer ([place])    a. s/p seed implants with chemo and radiation  . SAH (subarachnoid hemorrhage) ([place])    post-traumatic (fall) [facility] 12/2014  . TIA (transient ischemic attack)     Social History   Tobacco Use  . Smoking status: Never Smoker  . Smokeless tobacco: Never Used  Substance Use Topics  . Alcohol use: No    Alcohol/week: 0.0 oz    Comment: Former user 45 years ago  . Drug use: No    Family History  Problem Relation Age of Onset  . Colon cancer Mother 88  . Coronary artery disease Father   . Colon cancer Sister 3  . Diabetes Sister   . Colon cancer Other        2 cousins, succumbed to illness    No Known Allergies  Objective: Vitals:   11/21/17 1622  BP: (!) 93/57  Pulse: 73   There is no height or weight on file to calculate BMI.  Physical Exam    Constitutional: He is oriented to person, place, and time.  He is very pleasant and talkative. He is seated in a wheelchair.  Cardiovascular: Normal rate and regular rhythm.  No murmur heard. Pulmonary/Chest: Effort normal and breath sounds normal. He has no wheezes. He has no rales.  Left anterior chest AICD site looks good.  Neurological: He is alert and oriented to person, place, and time.  Psychiatric: Mood and affect normal.      Problem List Items Addressed This Visit      High   Staphylococcus aureus bacteremia with sepsis ([place])    I talked to them again about Robert possibility that his infection has been cured but that Robert only way we would have her know would be to stop his cephalexin and wait to see what happens. If Robert cephalexin is simply suppressing MSSA he would likely get very ill and require repeat hospitalization if he comes off of cephalexin. He is tolerating it well and prefers to stay on cephalexin for now. I am in agreement. He will follow-up in 4 months.          Michel Bickers, MD [facility] for Infectious [place] [facility] [phone] pager   [phone] cell 11/21/2017, 4:45 PM

## 2017-11-21 NOTE — Assessment & Plan Note (Signed)
I talked to them again about the possibility that his infection has been cured but that the only way we would have her know would be to stop his cephalexin and wait to see what happens. If the cephalexin is simply suppressing MSSA he would likely get very ill and require repeat hospitalization if he comes off of cephalexin. He is tolerating it well and prefers to stay on cephalexin for now. I am in agreement. He will follow-up in 4 months.

## 2017-11-22 DIAGNOSIS — D509 Iron deficiency anemia, unspecified: Secondary | ICD-10-CM | POA: Diagnosis not present

## 2017-11-22 DIAGNOSIS — D631 Anemia in chronic kidney disease: Secondary | ICD-10-CM | POA: Diagnosis not present

## 2017-11-22 DIAGNOSIS — N2581 Secondary hyperparathyroidism of renal origin: Secondary | ICD-10-CM | POA: Diagnosis not present

## 2017-11-22 DIAGNOSIS — N186 End stage renal disease: Secondary | ICD-10-CM | POA: Diagnosis not present

## 2017-11-22 DIAGNOSIS — Z992 Dependence on renal dialysis: Secondary | ICD-10-CM | POA: Diagnosis not present

## 2017-11-23 ENCOUNTER — Ambulatory Visit (INDEPENDENT_AMBULATORY_CARE_PROVIDER_SITE_OTHER): Payer: Medicare Other | Admitting: *Deleted

## 2017-11-23 DIAGNOSIS — I482 Chronic atrial fibrillation, unspecified: Secondary | ICD-10-CM

## 2017-11-23 DIAGNOSIS — Z7901 Long term (current) use of anticoagulants: Secondary | ICD-10-CM | POA: Diagnosis not present

## 2017-11-23 DIAGNOSIS — L89153 Pressure ulcer of sacral region, stage 3: Secondary | ICD-10-CM | POA: Diagnosis not present

## 2017-11-23 DIAGNOSIS — L89610 Pressure ulcer of right heel, unstageable: Secondary | ICD-10-CM | POA: Diagnosis not present

## 2017-11-23 DIAGNOSIS — E1122 Type 2 diabetes mellitus with diabetic chronic kidney disease: Secondary | ICD-10-CM | POA: Diagnosis not present

## 2017-11-23 DIAGNOSIS — I120 Hypertensive chronic kidney disease with stage 5 chronic kidney disease or end stage renal disease: Secondary | ICD-10-CM | POA: Diagnosis not present

## 2017-11-23 DIAGNOSIS — L89890 Pressure ulcer of other site, unstageable: Secondary | ICD-10-CM | POA: Diagnosis not present

## 2017-11-23 DIAGNOSIS — L89620 Pressure ulcer of left heel, unstageable: Secondary | ICD-10-CM | POA: Diagnosis not present

## 2017-11-23 LAB — POCT INR: INR: 1.7

## 2017-11-24 DIAGNOSIS — N186 End stage renal disease: Secondary | ICD-10-CM | POA: Diagnosis not present

## 2017-11-24 DIAGNOSIS — D631 Anemia in chronic kidney disease: Secondary | ICD-10-CM | POA: Diagnosis not present

## 2017-11-24 DIAGNOSIS — D509 Iron deficiency anemia, unspecified: Secondary | ICD-10-CM | POA: Diagnosis not present

## 2017-11-24 DIAGNOSIS — Z992 Dependence on renal dialysis: Secondary | ICD-10-CM | POA: Diagnosis not present

## 2017-11-24 DIAGNOSIS — N2581 Secondary hyperparathyroidism of renal origin: Secondary | ICD-10-CM | POA: Diagnosis not present

## 2017-11-25 DIAGNOSIS — L89620 Pressure ulcer of left heel, unstageable: Secondary | ICD-10-CM | POA: Diagnosis not present

## 2017-11-25 DIAGNOSIS — E1122 Type 2 diabetes mellitus with diabetic chronic kidney disease: Secondary | ICD-10-CM | POA: Diagnosis not present

## 2017-11-25 DIAGNOSIS — L89153 Pressure ulcer of sacral region, stage 3: Secondary | ICD-10-CM | POA: Diagnosis not present

## 2017-11-25 DIAGNOSIS — L89890 Pressure ulcer of other site, unstageable: Secondary | ICD-10-CM | POA: Diagnosis not present

## 2017-11-25 DIAGNOSIS — L89610 Pressure ulcer of right heel, unstageable: Secondary | ICD-10-CM | POA: Diagnosis not present

## 2017-11-25 DIAGNOSIS — I120 Hypertensive chronic kidney disease with stage 5 chronic kidney disease or end stage renal disease: Secondary | ICD-10-CM | POA: Diagnosis not present

## 2017-11-26 DIAGNOSIS — D631 Anemia in chronic kidney disease: Secondary | ICD-10-CM | POA: Diagnosis not present

## 2017-11-26 DIAGNOSIS — Z992 Dependence on renal dialysis: Secondary | ICD-10-CM | POA: Diagnosis not present

## 2017-11-26 DIAGNOSIS — N186 End stage renal disease: Secondary | ICD-10-CM | POA: Diagnosis not present

## 2017-11-26 DIAGNOSIS — D509 Iron deficiency anemia, unspecified: Secondary | ICD-10-CM | POA: Diagnosis not present

## 2017-11-26 DIAGNOSIS — N2581 Secondary hyperparathyroidism of renal origin: Secondary | ICD-10-CM | POA: Diagnosis not present

## 2017-11-27 DIAGNOSIS — L89620 Pressure ulcer of left heel, unstageable: Secondary | ICD-10-CM | POA: Diagnosis not present

## 2017-11-27 DIAGNOSIS — L89153 Pressure ulcer of sacral region, stage 3: Secondary | ICD-10-CM | POA: Diagnosis not present

## 2017-11-27 DIAGNOSIS — L89610 Pressure ulcer of right heel, unstageable: Secondary | ICD-10-CM | POA: Diagnosis not present

## 2017-11-27 DIAGNOSIS — L89890 Pressure ulcer of other site, unstageable: Secondary | ICD-10-CM | POA: Diagnosis not present

## 2017-11-27 DIAGNOSIS — I120 Hypertensive chronic kidney disease with stage 5 chronic kidney disease or end stage renal disease: Secondary | ICD-10-CM | POA: Diagnosis not present

## 2017-11-27 DIAGNOSIS — E1122 Type 2 diabetes mellitus with diabetic chronic kidney disease: Secondary | ICD-10-CM | POA: Diagnosis not present

## 2017-11-28 DIAGNOSIS — L89153 Pressure ulcer of sacral region, stage 3: Secondary | ICD-10-CM | POA: Diagnosis not present

## 2017-11-28 DIAGNOSIS — L89610 Pressure ulcer of right heel, unstageable: Secondary | ICD-10-CM | POA: Diagnosis not present

## 2017-11-28 DIAGNOSIS — E1122 Type 2 diabetes mellitus with diabetic chronic kidney disease: Secondary | ICD-10-CM | POA: Diagnosis not present

## 2017-11-28 DIAGNOSIS — I120 Hypertensive chronic kidney disease with stage 5 chronic kidney disease or end stage renal disease: Secondary | ICD-10-CM | POA: Diagnosis not present

## 2017-11-28 DIAGNOSIS — L89890 Pressure ulcer of other site, unstageable: Secondary | ICD-10-CM | POA: Diagnosis not present

## 2017-11-28 DIAGNOSIS — L89620 Pressure ulcer of left heel, unstageable: Secondary | ICD-10-CM | POA: Diagnosis not present

## 2017-11-30 ENCOUNTER — Other Ambulatory Visit (HOSPITAL_COMMUNITY): Payer: Self-pay | Admitting: *Deleted

## 2017-11-30 ENCOUNTER — Telehealth: Payer: Self-pay | Admitting: *Deleted

## 2017-11-30 ENCOUNTER — Ambulatory Visit (INDEPENDENT_AMBULATORY_CARE_PROVIDER_SITE_OTHER): Payer: Medicare Other | Admitting: *Deleted

## 2017-11-30 DIAGNOSIS — I482 Chronic atrial fibrillation, unspecified: Secondary | ICD-10-CM

## 2017-11-30 DIAGNOSIS — L89890 Pressure ulcer of other site, unstageable: Secondary | ICD-10-CM | POA: Diagnosis not present

## 2017-11-30 DIAGNOSIS — E1122 Type 2 diabetes mellitus with diabetic chronic kidney disease: Secondary | ICD-10-CM | POA: Diagnosis not present

## 2017-11-30 DIAGNOSIS — L89610 Pressure ulcer of right heel, unstageable: Secondary | ICD-10-CM | POA: Diagnosis not present

## 2017-11-30 DIAGNOSIS — Z7901 Long term (current) use of anticoagulants: Secondary | ICD-10-CM

## 2017-11-30 DIAGNOSIS — L89620 Pressure ulcer of left heel, unstageable: Secondary | ICD-10-CM | POA: Diagnosis not present

## 2017-11-30 DIAGNOSIS — I120 Hypertensive chronic kidney disease with stage 5 chronic kidney disease or end stage renal disease: Secondary | ICD-10-CM | POA: Diagnosis not present

## 2017-11-30 DIAGNOSIS — L89153 Pressure ulcer of sacral region, stage 3: Secondary | ICD-10-CM | POA: Diagnosis not present

## 2017-11-30 LAB — POCT INR: INR: 2.4

## 2017-11-30 MED ORDER — WARFARIN SODIUM 1 MG PO TABS: 1.0000 mg | ORAL_TABLET | Freq: Every day | ORAL | 3 refills | Status: DC

## 2017-11-30 MED ORDER — MIDODRINE HCL 5 MG PO TABS: 15.0000 mg | ORAL_TABLET | Freq: Two times a day (BID) | ORAL | 3 refills | Status: DC

## 2017-11-30 NOTE — Telephone Encounter (Signed)
INR 2.4 / please call with instructions / tg  °

## 2017-11-30 NOTE — Telephone Encounter (Signed)
Done.  [name] with [name].  See coumadin note.

## 2017-12-01 DIAGNOSIS — L89610 Pressure ulcer of right heel, unstageable: Secondary | ICD-10-CM | POA: Diagnosis not present

## 2017-12-01 DIAGNOSIS — L89890 Pressure ulcer of other site, unstageable: Secondary | ICD-10-CM | POA: Diagnosis not present

## 2017-12-01 DIAGNOSIS — Z992 Dependence on renal dialysis: Secondary | ICD-10-CM | POA: Diagnosis not present

## 2017-12-01 DIAGNOSIS — I120 Hypertensive chronic kidney disease with stage 5 chronic kidney disease or end stage renal disease: Secondary | ICD-10-CM | POA: Diagnosis not present

## 2017-12-01 DIAGNOSIS — L89153 Pressure ulcer of sacral region, stage 3: Secondary | ICD-10-CM | POA: Diagnosis not present

## 2017-12-01 DIAGNOSIS — N2581 Secondary hyperparathyroidism of renal origin: Secondary | ICD-10-CM | POA: Diagnosis not present

## 2017-12-01 DIAGNOSIS — L89620 Pressure ulcer of left heel, unstageable: Secondary | ICD-10-CM | POA: Diagnosis not present

## 2017-12-01 DIAGNOSIS — E1122 Type 2 diabetes mellitus with diabetic chronic kidney disease: Secondary | ICD-10-CM | POA: Diagnosis not present

## 2017-12-01 DIAGNOSIS — N186 End stage renal disease: Secondary | ICD-10-CM | POA: Diagnosis not present

## 2017-12-01 DIAGNOSIS — D509 Iron deficiency anemia, unspecified: Secondary | ICD-10-CM | POA: Diagnosis not present

## 2017-12-01 DIAGNOSIS — D631 Anemia in chronic kidney disease: Secondary | ICD-10-CM | POA: Diagnosis not present

## 2017-12-02 ENCOUNTER — Ambulatory Visit (INDEPENDENT_AMBULATORY_CARE_PROVIDER_SITE_OTHER): Payer: Medicare Other | Admitting: *Deleted

## 2017-12-02 DIAGNOSIS — L89620 Pressure ulcer of left heel, unstageable: Secondary | ICD-10-CM | POA: Diagnosis not present

## 2017-12-02 DIAGNOSIS — I255 Ischemic cardiomyopathy: Secondary | ICD-10-CM | POA: Diagnosis not present

## 2017-12-02 DIAGNOSIS — L89153 Pressure ulcer of sacral region, stage 3: Secondary | ICD-10-CM | POA: Diagnosis not present

## 2017-12-02 DIAGNOSIS — I120 Hypertensive chronic kidney disease with stage 5 chronic kidney disease or end stage renal disease: Secondary | ICD-10-CM | POA: Diagnosis not present

## 2017-12-02 DIAGNOSIS — L89610 Pressure ulcer of right heel, unstageable: Secondary | ICD-10-CM | POA: Diagnosis not present

## 2017-12-02 DIAGNOSIS — E1122 Type 2 diabetes mellitus with diabetic chronic kidney disease: Secondary | ICD-10-CM | POA: Diagnosis not present

## 2017-12-02 DIAGNOSIS — L89890 Pressure ulcer of other site, unstageable: Secondary | ICD-10-CM | POA: Diagnosis not present

## 2017-12-02 DIAGNOSIS — I5022 Chronic systolic (congestive) heart failure: Secondary | ICD-10-CM

## 2017-12-02 NOTE — Progress Notes (Signed)
Remote ICD transmission.   

## 2017-12-03 DIAGNOSIS — Z992 Dependence on renal dialysis: Secondary | ICD-10-CM | POA: Diagnosis not present

## 2017-12-03 DIAGNOSIS — N186 End stage renal disease: Secondary | ICD-10-CM | POA: Diagnosis not present

## 2017-12-03 DIAGNOSIS — N2581 Secondary hyperparathyroidism of renal origin: Secondary | ICD-10-CM | POA: Diagnosis not present

## 2017-12-03 DIAGNOSIS — D509 Iron deficiency anemia, unspecified: Secondary | ICD-10-CM | POA: Diagnosis not present

## 2017-12-03 DIAGNOSIS — D631 Anemia in chronic kidney disease: Secondary | ICD-10-CM | POA: Diagnosis not present

## 2017-12-05 ENCOUNTER — Encounter: Payer: Self-pay | Admitting: Cardiology

## 2017-12-05 DIAGNOSIS — L89890 Pressure ulcer of other site, unstageable: Secondary | ICD-10-CM | POA: Diagnosis not present

## 2017-12-05 DIAGNOSIS — Z992 Dependence on renal dialysis: Secondary | ICD-10-CM | POA: Diagnosis not present

## 2017-12-05 DIAGNOSIS — D631 Anemia in chronic kidney disease: Secondary | ICD-10-CM | POA: Diagnosis not present

## 2017-12-05 DIAGNOSIS — L89610 Pressure ulcer of right heel, unstageable: Secondary | ICD-10-CM | POA: Diagnosis not present

## 2017-12-05 DIAGNOSIS — N2581 Secondary hyperparathyroidism of renal origin: Secondary | ICD-10-CM | POA: Diagnosis not present

## 2017-12-05 DIAGNOSIS — D509 Iron deficiency anemia, unspecified: Secondary | ICD-10-CM | POA: Diagnosis not present

## 2017-12-05 DIAGNOSIS — N186 End stage renal disease: Secondary | ICD-10-CM | POA: Diagnosis not present

## 2017-12-05 DIAGNOSIS — L89153 Pressure ulcer of sacral region, stage 3: Secondary | ICD-10-CM | POA: Diagnosis not present

## 2017-12-05 DIAGNOSIS — E1122 Type 2 diabetes mellitus with diabetic chronic kidney disease: Secondary | ICD-10-CM | POA: Diagnosis not present

## 2017-12-05 DIAGNOSIS — L89620 Pressure ulcer of left heel, unstageable: Secondary | ICD-10-CM | POA: Diagnosis not present

## 2017-12-05 DIAGNOSIS — I120 Hypertensive chronic kidney disease with stage 5 chronic kidney disease or end stage renal disease: Secondary | ICD-10-CM | POA: Diagnosis not present

## 2017-12-07 ENCOUNTER — Ambulatory Visit (INDEPENDENT_AMBULATORY_CARE_PROVIDER_SITE_OTHER): Payer: Medicare Other | Admitting: *Deleted

## 2017-12-07 DIAGNOSIS — Z0001 Encounter for general adult medical examination with abnormal findings: Secondary | ICD-10-CM | POA: Diagnosis not present

## 2017-12-07 DIAGNOSIS — E663 Overweight: Secondary | ICD-10-CM | POA: Diagnosis not present

## 2017-12-07 DIAGNOSIS — L89153 Pressure ulcer of sacral region, stage 3: Secondary | ICD-10-CM | POA: Diagnosis not present

## 2017-12-07 DIAGNOSIS — I482 Chronic atrial fibrillation, unspecified: Secondary | ICD-10-CM

## 2017-12-07 DIAGNOSIS — Z7901 Long term (current) use of anticoagulants: Secondary | ICD-10-CM | POA: Diagnosis not present

## 2017-12-07 DIAGNOSIS — E063 Autoimmune thyroiditis: Secondary | ICD-10-CM | POA: Diagnosis not present

## 2017-12-07 DIAGNOSIS — Z6825 Body mass index (BMI) 25.0-25.9, adult: Secondary | ICD-10-CM | POA: Diagnosis not present

## 2017-12-07 DIAGNOSIS — Z1389 Encounter for screening for other disorder: Secondary | ICD-10-CM | POA: Diagnosis not present

## 2017-12-07 DIAGNOSIS — E1140 Type 2 diabetes mellitus with diabetic neuropathy, unspecified: Secondary | ICD-10-CM | POA: Diagnosis not present

## 2017-12-07 DIAGNOSIS — L89610 Pressure ulcer of right heel, unstageable: Secondary | ICD-10-CM | POA: Diagnosis not present

## 2017-12-07 DIAGNOSIS — L89620 Pressure ulcer of left heel, unstageable: Secondary | ICD-10-CM | POA: Diagnosis not present

## 2017-12-07 DIAGNOSIS — I120 Hypertensive chronic kidney disease with stage 5 chronic kidney disease or end stage renal disease: Secondary | ICD-10-CM | POA: Diagnosis not present

## 2017-12-07 DIAGNOSIS — L89890 Pressure ulcer of other site, unstageable: Secondary | ICD-10-CM | POA: Diagnosis not present

## 2017-12-07 DIAGNOSIS — E1122 Type 2 diabetes mellitus with diabetic chronic kidney disease: Secondary | ICD-10-CM | POA: Diagnosis not present

## 2017-12-07 DIAGNOSIS — E1165 Type 2 diabetes mellitus with hyperglycemia: Secondary | ICD-10-CM | POA: Diagnosis not present

## 2017-12-07 LAB — POCT INR: INR: 2.4

## 2017-12-08 DIAGNOSIS — L89153 Pressure ulcer of sacral region, stage 3: Secondary | ICD-10-CM | POA: Diagnosis not present

## 2017-12-08 DIAGNOSIS — Z992 Dependence on renal dialysis: Secondary | ICD-10-CM | POA: Diagnosis not present

## 2017-12-08 DIAGNOSIS — N186 End stage renal disease: Secondary | ICD-10-CM | POA: Diagnosis not present

## 2017-12-08 DIAGNOSIS — I120 Hypertensive chronic kidney disease with stage 5 chronic kidney disease or end stage renal disease: Secondary | ICD-10-CM | POA: Diagnosis not present

## 2017-12-08 DIAGNOSIS — L89890 Pressure ulcer of other site, unstageable: Secondary | ICD-10-CM | POA: Diagnosis not present

## 2017-12-08 DIAGNOSIS — L89610 Pressure ulcer of right heel, unstageable: Secondary | ICD-10-CM | POA: Diagnosis not present

## 2017-12-08 DIAGNOSIS — D509 Iron deficiency anemia, unspecified: Secondary | ICD-10-CM | POA: Diagnosis not present

## 2017-12-08 DIAGNOSIS — L89620 Pressure ulcer of left heel, unstageable: Secondary | ICD-10-CM | POA: Diagnosis not present

## 2017-12-08 DIAGNOSIS — E1122 Type 2 diabetes mellitus with diabetic chronic kidney disease: Secondary | ICD-10-CM | POA: Diagnosis not present

## 2017-12-08 DIAGNOSIS — D631 Anemia in chronic kidney disease: Secondary | ICD-10-CM | POA: Diagnosis not present

## 2017-12-09 DIAGNOSIS — L89890 Pressure ulcer of other site, unstageable: Secondary | ICD-10-CM | POA: Diagnosis not present

## 2017-12-09 DIAGNOSIS — I120 Hypertensive chronic kidney disease with stage 5 chronic kidney disease or end stage renal disease: Secondary | ICD-10-CM | POA: Diagnosis not present

## 2017-12-09 DIAGNOSIS — E1122 Type 2 diabetes mellitus with diabetic chronic kidney disease: Secondary | ICD-10-CM | POA: Diagnosis not present

## 2017-12-09 DIAGNOSIS — L89620 Pressure ulcer of left heel, unstageable: Secondary | ICD-10-CM | POA: Diagnosis not present

## 2017-12-09 DIAGNOSIS — L89610 Pressure ulcer of right heel, unstageable: Secondary | ICD-10-CM | POA: Diagnosis not present

## 2017-12-09 DIAGNOSIS — L89153 Pressure ulcer of sacral region, stage 3: Secondary | ICD-10-CM | POA: Diagnosis not present

## 2017-12-10 DIAGNOSIS — D509 Iron deficiency anemia, unspecified: Secondary | ICD-10-CM | POA: Diagnosis not present

## 2017-12-10 DIAGNOSIS — Z992 Dependence on renal dialysis: Secondary | ICD-10-CM | POA: Diagnosis not present

## 2017-12-10 DIAGNOSIS — D631 Anemia in chronic kidney disease: Secondary | ICD-10-CM | POA: Diagnosis not present

## 2017-12-10 DIAGNOSIS — N186 End stage renal disease: Secondary | ICD-10-CM | POA: Diagnosis not present

## 2017-12-12 ENCOUNTER — Other Ambulatory Visit (HOSPITAL_COMMUNITY): Payer: Self-pay | Admitting: Physician Assistant

## 2017-12-12 DIAGNOSIS — R748 Abnormal levels of other serum enzymes: Secondary | ICD-10-CM

## 2017-12-12 DIAGNOSIS — L89890 Pressure ulcer of other site, unstageable: Secondary | ICD-10-CM | POA: Diagnosis not present

## 2017-12-12 DIAGNOSIS — Z789 Other specified health status: Secondary | ICD-10-CM | POA: Diagnosis not present

## 2017-12-12 DIAGNOSIS — E1122 Type 2 diabetes mellitus with diabetic chronic kidney disease: Secondary | ICD-10-CM | POA: Diagnosis not present

## 2017-12-12 DIAGNOSIS — L89620 Pressure ulcer of left heel, unstageable: Secondary | ICD-10-CM | POA: Diagnosis not present

## 2017-12-12 DIAGNOSIS — L89610 Pressure ulcer of right heel, unstageable: Secondary | ICD-10-CM | POA: Diagnosis not present

## 2017-12-12 DIAGNOSIS — I120 Hypertensive chronic kidney disease with stage 5 chronic kidney disease or end stage renal disease: Secondary | ICD-10-CM | POA: Diagnosis not present

## 2017-12-12 DIAGNOSIS — K7689 Other specified diseases of liver: Secondary | ICD-10-CM | POA: Diagnosis not present

## 2017-12-12 DIAGNOSIS — L89153 Pressure ulcer of sacral region, stage 3: Secondary | ICD-10-CM | POA: Diagnosis not present

## 2017-12-12 DIAGNOSIS — N184 Chronic kidney disease, stage 4 (severe): Secondary | ICD-10-CM | POA: Diagnosis not present

## 2017-12-13 ENCOUNTER — Encounter: Payer: Self-pay | Admitting: Internal Medicine

## 2017-12-13 DIAGNOSIS — D509 Iron deficiency anemia, unspecified: Secondary | ICD-10-CM | POA: Diagnosis not present

## 2017-12-13 DIAGNOSIS — D631 Anemia in chronic kidney disease: Secondary | ICD-10-CM | POA: Diagnosis not present

## 2017-12-13 DIAGNOSIS — Z992 Dependence on renal dialysis: Secondary | ICD-10-CM | POA: Diagnosis not present

## 2017-12-13 DIAGNOSIS — N186 End stage renal disease: Secondary | ICD-10-CM | POA: Diagnosis not present

## 2017-12-14 ENCOUNTER — Telehealth: Payer: Self-pay | Admitting: *Deleted

## 2017-12-14 ENCOUNTER — Ambulatory Visit (INDEPENDENT_AMBULATORY_CARE_PROVIDER_SITE_OTHER): Payer: Medicare Other | Admitting: *Deleted

## 2017-12-14 ENCOUNTER — Other Ambulatory Visit (HOSPITAL_COMMUNITY): Payer: Self-pay | Admitting: Physician Assistant

## 2017-12-14 DIAGNOSIS — L89890 Pressure ulcer of other site, unstageable: Secondary | ICD-10-CM | POA: Diagnosis not present

## 2017-12-14 DIAGNOSIS — E1122 Type 2 diabetes mellitus with diabetic chronic kidney disease: Secondary | ICD-10-CM | POA: Diagnosis not present

## 2017-12-14 DIAGNOSIS — Z5181 Encounter for therapeutic drug level monitoring: Secondary | ICD-10-CM | POA: Diagnosis not present

## 2017-12-14 DIAGNOSIS — Z7901 Long term (current) use of anticoagulants: Secondary | ICD-10-CM

## 2017-12-14 DIAGNOSIS — K7460 Unspecified cirrhosis of liver: Secondary | ICD-10-CM

## 2017-12-14 DIAGNOSIS — I120 Hypertensive chronic kidney disease with stage 5 chronic kidney disease or end stage renal disease: Secondary | ICD-10-CM | POA: Diagnosis not present

## 2017-12-14 DIAGNOSIS — L89153 Pressure ulcer of sacral region, stage 3: Secondary | ICD-10-CM | POA: Diagnosis not present

## 2017-12-14 DIAGNOSIS — I482 Chronic atrial fibrillation, unspecified: Secondary | ICD-10-CM

## 2017-12-14 DIAGNOSIS — L89620 Pressure ulcer of left heel, unstageable: Secondary | ICD-10-CM | POA: Diagnosis not present

## 2017-12-14 DIAGNOSIS — L89610 Pressure ulcer of right heel, unstageable: Secondary | ICD-10-CM | POA: Diagnosis not present

## 2017-12-14 DIAGNOSIS — R748 Abnormal levels of other serum enzymes: Secondary | ICD-10-CM

## 2017-12-14 LAB — POCT INR: INR: 2.1

## 2017-12-14 NOTE — Telephone Encounter (Signed)
INR - 2.1 / please call with instructions. / tg  °

## 2017-12-14 NOTE — Telephone Encounter (Signed)
Done.  See coumadin note. 

## 2017-12-15 DIAGNOSIS — Z992 Dependence on renal dialysis: Secondary | ICD-10-CM | POA: Diagnosis not present

## 2017-12-15 DIAGNOSIS — D509 Iron deficiency anemia, unspecified: Secondary | ICD-10-CM | POA: Diagnosis not present

## 2017-12-15 DIAGNOSIS — D631 Anemia in chronic kidney disease: Secondary | ICD-10-CM | POA: Diagnosis not present

## 2017-12-15 DIAGNOSIS — N186 End stage renal disease: Secondary | ICD-10-CM | POA: Diagnosis not present

## 2017-12-16 DIAGNOSIS — E1122 Type 2 diabetes mellitus with diabetic chronic kidney disease: Secondary | ICD-10-CM | POA: Diagnosis not present

## 2017-12-16 DIAGNOSIS — L89620 Pressure ulcer of left heel, unstageable: Secondary | ICD-10-CM | POA: Diagnosis not present

## 2017-12-16 DIAGNOSIS — L89890 Pressure ulcer of other site, unstageable: Secondary | ICD-10-CM | POA: Diagnosis not present

## 2017-12-16 DIAGNOSIS — L89153 Pressure ulcer of sacral region, stage 3: Secondary | ICD-10-CM | POA: Diagnosis not present

## 2017-12-16 DIAGNOSIS — L89610 Pressure ulcer of right heel, unstageable: Secondary | ICD-10-CM | POA: Diagnosis not present

## 2017-12-16 DIAGNOSIS — I120 Hypertensive chronic kidney disease with stage 5 chronic kidney disease or end stage renal disease: Secondary | ICD-10-CM | POA: Diagnosis not present

## 2017-12-17 DIAGNOSIS — Z992 Dependence on renal dialysis: Secondary | ICD-10-CM | POA: Diagnosis not present

## 2017-12-17 DIAGNOSIS — D631 Anemia in chronic kidney disease: Secondary | ICD-10-CM | POA: Diagnosis not present

## 2017-12-17 DIAGNOSIS — D509 Iron deficiency anemia, unspecified: Secondary | ICD-10-CM | POA: Diagnosis not present

## 2017-12-17 DIAGNOSIS — N186 End stage renal disease: Secondary | ICD-10-CM | POA: Diagnosis not present

## 2017-12-19 DIAGNOSIS — N2581 Secondary hyperparathyroidism of renal origin: Secondary | ICD-10-CM | POA: Diagnosis not present

## 2017-12-19 DIAGNOSIS — L89890 Pressure ulcer of other site, unstageable: Secondary | ICD-10-CM | POA: Diagnosis not present

## 2017-12-19 DIAGNOSIS — N186 End stage renal disease: Secondary | ICD-10-CM | POA: Diagnosis not present

## 2017-12-19 DIAGNOSIS — Z992 Dependence on renal dialysis: Secondary | ICD-10-CM | POA: Diagnosis not present

## 2017-12-19 DIAGNOSIS — D509 Iron deficiency anemia, unspecified: Secondary | ICD-10-CM | POA: Diagnosis not present

## 2017-12-19 DIAGNOSIS — E1122 Type 2 diabetes mellitus with diabetic chronic kidney disease: Secondary | ICD-10-CM | POA: Diagnosis not present

## 2017-12-19 DIAGNOSIS — L89610 Pressure ulcer of right heel, unstageable: Secondary | ICD-10-CM | POA: Diagnosis not present

## 2017-12-19 DIAGNOSIS — I120 Hypertensive chronic kidney disease with stage 5 chronic kidney disease or end stage renal disease: Secondary | ICD-10-CM | POA: Diagnosis not present

## 2017-12-19 DIAGNOSIS — L89153 Pressure ulcer of sacral region, stage 3: Secondary | ICD-10-CM | POA: Diagnosis not present

## 2017-12-19 DIAGNOSIS — D631 Anemia in chronic kidney disease: Secondary | ICD-10-CM | POA: Diagnosis not present

## 2017-12-19 DIAGNOSIS — L89620 Pressure ulcer of left heel, unstageable: Secondary | ICD-10-CM | POA: Diagnosis not present

## 2017-12-20 DIAGNOSIS — N185 Chronic kidney disease, stage 5: Secondary | ICD-10-CM | POA: Diagnosis not present

## 2017-12-20 DIAGNOSIS — D631 Anemia in chronic kidney disease: Secondary | ICD-10-CM | POA: Diagnosis not present

## 2017-12-20 DIAGNOSIS — L89890 Pressure ulcer of other site, unstageable: Secondary | ICD-10-CM | POA: Diagnosis not present

## 2017-12-20 DIAGNOSIS — I120 Hypertensive chronic kidney disease with stage 5 chronic kidney disease or end stage renal disease: Secondary | ICD-10-CM | POA: Diagnosis not present

## 2017-12-20 DIAGNOSIS — L89610 Pressure ulcer of right heel, unstageable: Secondary | ICD-10-CM | POA: Diagnosis not present

## 2017-12-20 DIAGNOSIS — E1122 Type 2 diabetes mellitus with diabetic chronic kidney disease: Secondary | ICD-10-CM | POA: Diagnosis not present

## 2017-12-20 DIAGNOSIS — L89620 Pressure ulcer of left heel, unstageable: Secondary | ICD-10-CM | POA: Diagnosis not present

## 2017-12-20 DIAGNOSIS — L89153 Pressure ulcer of sacral region, stage 3: Secondary | ICD-10-CM | POA: Diagnosis not present

## 2017-12-20 LAB — CUP PACEART REMOTE DEVICE CHECK
Battery Voltage: 2.98 V
Date Time Interrogation Session: 20181228070016
HighPow Impedance: 32 Ohm
HighPow Impedance: 32 Ohm
Implantable Lead Implant Date: 20091026
Implantable Lead Location: 753858
Implantable Lead Location: 753860
Implantable Pulse Generator Implant Date: 20160505
Lead Channel Impedance Value: 360 Ohm
Lead Channel Pacing Threshold Amplitude: 0.75 V
Lead Channel Pacing Threshold Pulse Width: 0.5 ms
Lead Channel Sensing Intrinsic Amplitude: 1.7 mV
Lead Channel Sensing Intrinsic Amplitude: 7.7 mV
Lead Channel Setting Pacing Amplitude: 2.5 V
Lead Channel Setting Pacing Pulse Width: 0.8 ms
Lead Channel Setting Sensing Sensitivity: 2 mV
MDC IDC LEAD IMPLANT DT: 20020906
MDC IDC LEAD IMPLANT DT: 20091026
MDC IDC LEAD LOCATION: 753859
MDC IDC LEAD SERIAL: 125581
MDC IDC MSMT BATTERY REMAINING LONGEVITY: 48 mo
MDC IDC MSMT BATTERY REMAINING PERCENTAGE: 61 %
MDC IDC MSMT LEADCHNL LV IMPEDANCE VALUE: 400 Ohm
MDC IDC MSMT LEADCHNL RV IMPEDANCE VALUE: 560 Ohm
MDC IDC MSMT LEADCHNL RV PACING THRESHOLD AMPLITUDE: 1 V
MDC IDC MSMT LEADCHNL RV PACING THRESHOLD PULSEWIDTH: 0.8 ms
MDC IDC PG SERIAL: 7238042
MDC IDC SET LEADCHNL LV PACING AMPLITUDE: 2 V
MDC IDC SET LEADCHNL LV PACING PULSEWIDTH: 0.5 ms

## 2017-12-21 ENCOUNTER — Ambulatory Visit (INDEPENDENT_AMBULATORY_CARE_PROVIDER_SITE_OTHER): Payer: Medicare Other | Admitting: *Deleted

## 2017-12-21 ENCOUNTER — Telehealth: Payer: Self-pay | Admitting: *Deleted

## 2017-12-21 DIAGNOSIS — L89610 Pressure ulcer of right heel, unstageable: Secondary | ICD-10-CM | POA: Diagnosis not present

## 2017-12-21 DIAGNOSIS — D509 Iron deficiency anemia, unspecified: Secondary | ICD-10-CM | POA: Diagnosis not present

## 2017-12-21 DIAGNOSIS — N2581 Secondary hyperparathyroidism of renal origin: Secondary | ICD-10-CM | POA: Diagnosis not present

## 2017-12-21 DIAGNOSIS — Z7901 Long term (current) use of anticoagulants: Secondary | ICD-10-CM

## 2017-12-21 DIAGNOSIS — I482 Chronic atrial fibrillation, unspecified: Secondary | ICD-10-CM

## 2017-12-21 DIAGNOSIS — N186 End stage renal disease: Secondary | ICD-10-CM | POA: Diagnosis not present

## 2017-12-21 DIAGNOSIS — D631 Anemia in chronic kidney disease: Secondary | ICD-10-CM | POA: Diagnosis not present

## 2017-12-21 DIAGNOSIS — L89153 Pressure ulcer of sacral region, stage 3: Secondary | ICD-10-CM | POA: Diagnosis not present

## 2017-12-21 DIAGNOSIS — L89890 Pressure ulcer of other site, unstageable: Secondary | ICD-10-CM | POA: Diagnosis not present

## 2017-12-21 DIAGNOSIS — I120 Hypertensive chronic kidney disease with stage 5 chronic kidney disease or end stage renal disease: Secondary | ICD-10-CM | POA: Diagnosis not present

## 2017-12-21 DIAGNOSIS — E1122 Type 2 diabetes mellitus with diabetic chronic kidney disease: Secondary | ICD-10-CM | POA: Diagnosis not present

## 2017-12-21 DIAGNOSIS — Z992 Dependence on renal dialysis: Secondary | ICD-10-CM | POA: Diagnosis not present

## 2017-12-21 DIAGNOSIS — L89620 Pressure ulcer of left heel, unstageable: Secondary | ICD-10-CM | POA: Diagnosis not present

## 2017-12-21 LAB — POCT INR: INR: 2.1

## 2017-12-21 NOTE — Patient Instructions (Signed)
Continue coumadin 1 tablet daily Recheck in 1 week Order given to Tammy Bray RN [facility] 

## 2017-12-21 NOTE — Telephone Encounter (Signed)
Done.  See coumadin note. 

## 2017-12-22 DIAGNOSIS — E1122 Type 2 diabetes mellitus with diabetic chronic kidney disease: Secondary | ICD-10-CM | POA: Diagnosis not present

## 2017-12-22 DIAGNOSIS — L89890 Pressure ulcer of other site, unstageable: Secondary | ICD-10-CM | POA: Diagnosis not present

## 2017-12-22 DIAGNOSIS — L89153 Pressure ulcer of sacral region, stage 3: Secondary | ICD-10-CM | POA: Diagnosis not present

## 2017-12-22 DIAGNOSIS — I120 Hypertensive chronic kidney disease with stage 5 chronic kidney disease or end stage renal disease: Secondary | ICD-10-CM | POA: Diagnosis not present

## 2017-12-22 DIAGNOSIS — L89610 Pressure ulcer of right heel, unstageable: Secondary | ICD-10-CM | POA: Diagnosis not present

## 2017-12-22 DIAGNOSIS — L89620 Pressure ulcer of left heel, unstageable: Secondary | ICD-10-CM | POA: Diagnosis not present

## 2017-12-23 DIAGNOSIS — Z992 Dependence on renal dialysis: Secondary | ICD-10-CM | POA: Diagnosis not present

## 2017-12-23 DIAGNOSIS — I120 Hypertensive chronic kidney disease with stage 5 chronic kidney disease or end stage renal disease: Secondary | ICD-10-CM | POA: Diagnosis not present

## 2017-12-23 DIAGNOSIS — L89890 Pressure ulcer of other site, unstageable: Secondary | ICD-10-CM | POA: Diagnosis not present

## 2017-12-23 DIAGNOSIS — N2581 Secondary hyperparathyroidism of renal origin: Secondary | ICD-10-CM | POA: Diagnosis not present

## 2017-12-23 DIAGNOSIS — L89620 Pressure ulcer of left heel, unstageable: Secondary | ICD-10-CM | POA: Diagnosis not present

## 2017-12-23 DIAGNOSIS — E1122 Type 2 diabetes mellitus with diabetic chronic kidney disease: Secondary | ICD-10-CM | POA: Diagnosis not present

## 2017-12-23 DIAGNOSIS — L89610 Pressure ulcer of right heel, unstageable: Secondary | ICD-10-CM | POA: Diagnosis not present

## 2017-12-23 DIAGNOSIS — D509 Iron deficiency anemia, unspecified: Secondary | ICD-10-CM | POA: Diagnosis not present

## 2017-12-23 DIAGNOSIS — D631 Anemia in chronic kidney disease: Secondary | ICD-10-CM | POA: Diagnosis not present

## 2017-12-23 DIAGNOSIS — N186 End stage renal disease: Secondary | ICD-10-CM | POA: Diagnosis not present

## 2017-12-23 DIAGNOSIS — L89153 Pressure ulcer of sacral region, stage 3: Secondary | ICD-10-CM | POA: Diagnosis not present

## 2017-12-26 DIAGNOSIS — D631 Anemia in chronic kidney disease: Secondary | ICD-10-CM | POA: Diagnosis not present

## 2017-12-26 DIAGNOSIS — L89610 Pressure ulcer of right heel, unstageable: Secondary | ICD-10-CM | POA: Diagnosis not present

## 2017-12-26 DIAGNOSIS — L89890 Pressure ulcer of other site, unstageable: Secondary | ICD-10-CM | POA: Diagnosis not present

## 2017-12-26 DIAGNOSIS — N2581 Secondary hyperparathyroidism of renal origin: Secondary | ICD-10-CM | POA: Diagnosis not present

## 2017-12-26 DIAGNOSIS — Z992 Dependence on renal dialysis: Secondary | ICD-10-CM | POA: Diagnosis not present

## 2017-12-26 DIAGNOSIS — E1122 Type 2 diabetes mellitus with diabetic chronic kidney disease: Secondary | ICD-10-CM | POA: Diagnosis not present

## 2017-12-26 DIAGNOSIS — L89153 Pressure ulcer of sacral region, stage 3: Secondary | ICD-10-CM | POA: Diagnosis not present

## 2017-12-26 DIAGNOSIS — I120 Hypertensive chronic kidney disease with stage 5 chronic kidney disease or end stage renal disease: Secondary | ICD-10-CM | POA: Diagnosis not present

## 2017-12-26 DIAGNOSIS — D509 Iron deficiency anemia, unspecified: Secondary | ICD-10-CM | POA: Diagnosis not present

## 2017-12-26 DIAGNOSIS — N186 End stage renal disease: Secondary | ICD-10-CM | POA: Diagnosis not present

## 2017-12-26 DIAGNOSIS — L89620 Pressure ulcer of left heel, unstageable: Secondary | ICD-10-CM | POA: Diagnosis not present

## 2017-12-27 DIAGNOSIS — L89153 Pressure ulcer of sacral region, stage 3: Secondary | ICD-10-CM | POA: Diagnosis not present

## 2017-12-27 DIAGNOSIS — L89620 Pressure ulcer of left heel, unstageable: Secondary | ICD-10-CM | POA: Diagnosis not present

## 2017-12-27 DIAGNOSIS — L89610 Pressure ulcer of right heel, unstageable: Secondary | ICD-10-CM | POA: Diagnosis not present

## 2017-12-27 DIAGNOSIS — E1122 Type 2 diabetes mellitus with diabetic chronic kidney disease: Secondary | ICD-10-CM | POA: Diagnosis not present

## 2017-12-27 DIAGNOSIS — L89890 Pressure ulcer of other site, unstageable: Secondary | ICD-10-CM | POA: Diagnosis not present

## 2017-12-27 DIAGNOSIS — I120 Hypertensive chronic kidney disease with stage 5 chronic kidney disease or end stage renal disease: Secondary | ICD-10-CM | POA: Diagnosis not present

## 2017-12-28 ENCOUNTER — Ambulatory Visit (HOSPITAL_COMMUNITY)
Admission: RE | Admit: 2017-12-28 | Discharge: 2017-12-28 | Disposition: A | Discharge status: Home / Self Care | Payer: Medicare Other | Source: Ambulatory Visit | Attending: Internal Medicine | Admitting: Internal Medicine

## 2017-12-28 ENCOUNTER — Encounter (HOSPITAL_COMMUNITY): Payer: Self-pay | Admitting: Internal Medicine

## 2017-12-28 ENCOUNTER — Other Ambulatory Visit: Payer: Self-pay

## 2017-12-28 VITALS — BP 98/58 | HR 73 | Wt 169.4 lb

## 2017-12-28 DIAGNOSIS — I480 Paroxysmal atrial fibrillation: Secondary | ICD-10-CM | POA: Diagnosis not present

## 2017-12-28 DIAGNOSIS — I255 Ischemic cardiomyopathy: Secondary | ICD-10-CM | POA: Diagnosis not present

## 2017-12-28 DIAGNOSIS — L89620 Pressure ulcer of left heel, unstageable: Secondary | ICD-10-CM | POA: Diagnosis not present

## 2017-12-28 DIAGNOSIS — I482 Chronic atrial fibrillation, unspecified: Secondary | ICD-10-CM

## 2017-12-28 DIAGNOSIS — Z8673 Personal history of transient ischemic attack (TIA), and cerebral infarction without residual deficits: Secondary | ICD-10-CM | POA: Diagnosis not present

## 2017-12-28 DIAGNOSIS — I252 Old myocardial infarction: Secondary | ICD-10-CM | POA: Diagnosis not present

## 2017-12-28 DIAGNOSIS — L89610 Pressure ulcer of right heel, unstageable: Secondary | ICD-10-CM | POA: Diagnosis not present

## 2017-12-28 DIAGNOSIS — I132 Hypertensive heart and chronic kidney disease with heart failure and with stage 5 chronic kidney disease, or end stage renal disease: Secondary | ICD-10-CM | POA: Insufficient documentation

## 2017-12-28 DIAGNOSIS — Z933 Colostomy status: Secondary | ICD-10-CM | POA: Diagnosis not present

## 2017-12-28 DIAGNOSIS — E1122 Type 2 diabetes mellitus with diabetic chronic kidney disease: Secondary | ICD-10-CM | POA: Diagnosis not present

## 2017-12-28 DIAGNOSIS — K219 Gastro-esophageal reflux disease without esophagitis: Secondary | ICD-10-CM | POA: Diagnosis not present

## 2017-12-28 DIAGNOSIS — E785 Hyperlipidemia, unspecified: Secondary | ICD-10-CM | POA: Insufficient documentation

## 2017-12-28 DIAGNOSIS — Z955 Presence of coronary angioplasty implant and graft: Secondary | ICD-10-CM | POA: Diagnosis not present

## 2017-12-28 DIAGNOSIS — N186 End stage renal disease: Secondary | ICD-10-CM | POA: Insufficient documentation

## 2017-12-28 DIAGNOSIS — D631 Anemia in chronic kidney disease: Secondary | ICD-10-CM | POA: Diagnosis not present

## 2017-12-28 DIAGNOSIS — Z8249 Family history of ischemic heart disease and other diseases of the circulatory system: Secondary | ICD-10-CM | POA: Diagnosis not present

## 2017-12-28 DIAGNOSIS — Z992 Dependence on renal dialysis: Secondary | ICD-10-CM | POA: Diagnosis not present

## 2017-12-28 DIAGNOSIS — Z9889 Other specified postprocedural states: Secondary | ICD-10-CM | POA: Diagnosis not present

## 2017-12-28 DIAGNOSIS — Z79890 Hormone replacement therapy (postmenopausal): Secondary | ICD-10-CM | POA: Diagnosis not present

## 2017-12-28 DIAGNOSIS — G4733 Obstructive sleep apnea (adult) (pediatric): Secondary | ICD-10-CM | POA: Insufficient documentation

## 2017-12-28 DIAGNOSIS — I2510 Atherosclerotic heart disease of native coronary artery without angina pectoris: Secondary | ICD-10-CM | POA: Insufficient documentation

## 2017-12-28 DIAGNOSIS — Z95810 Presence of automatic (implantable) cardiac defibrillator: Secondary | ICD-10-CM | POA: Insufficient documentation

## 2017-12-28 DIAGNOSIS — L89890 Pressure ulcer of other site, unstageable: Secondary | ICD-10-CM | POA: Diagnosis not present

## 2017-12-28 DIAGNOSIS — Z8546 Personal history of malignant neoplasm of prostate: Secondary | ICD-10-CM | POA: Insufficient documentation

## 2017-12-28 DIAGNOSIS — Z79899 Other long term (current) drug therapy: Secondary | ICD-10-CM | POA: Insufficient documentation

## 2017-12-28 DIAGNOSIS — R7881 Bacteremia: Secondary | ICD-10-CM | POA: Insufficient documentation

## 2017-12-28 DIAGNOSIS — D509 Iron deficiency anemia, unspecified: Secondary | ICD-10-CM | POA: Diagnosis not present

## 2017-12-28 DIAGNOSIS — Z7901 Long term (current) use of anticoagulants: Secondary | ICD-10-CM | POA: Insufficient documentation

## 2017-12-28 DIAGNOSIS — Z85048 Personal history of other malignant neoplasm of rectum, rectosigmoid junction, and anus: Secondary | ICD-10-CM | POA: Insufficient documentation

## 2017-12-28 DIAGNOSIS — N2581 Secondary hyperparathyroidism of renal origin: Secondary | ICD-10-CM | POA: Diagnosis not present

## 2017-12-28 DIAGNOSIS — I120 Hypertensive chronic kidney disease with stage 5 chronic kidney disease or end stage renal disease: Secondary | ICD-10-CM | POA: Diagnosis not present

## 2017-12-28 DIAGNOSIS — L89153 Pressure ulcer of sacral region, stage 3: Secondary | ICD-10-CM | POA: Diagnosis not present

## 2017-12-28 DIAGNOSIS — I5022 Chronic systolic (congestive) heart failure: Secondary | ICD-10-CM | POA: Diagnosis not present

## 2017-12-28 MED ORDER — ATORVASTATIN CALCIUM 10 MG PO TABS: 10.0000 mg | ORAL_TABLET | Freq: Every day | ORAL | 3 refills | Status: AC

## 2017-12-28 NOTE — Patient Instructions (Signed)
Start Atorvastatin 10 mg daily  Your physician recommends that you schedule a follow-up appointment in: 3 months with echocardiogram

## 2017-12-28 NOTE — Progress Notes (Signed)
Patient ID: AXZEL RISTINE, male   DOB: 1956-06-26, 62 y.o.   MRN: [id number]    Advanced Heart Failure Clinic Note   PCP: Dr. [name] Oncologist: Dr Truett Perna.  CHF: Gracin Mcpartland  Mehtaab is a 62 yo with history of CAD and systolic HF due to ischemic cardiomyopathy EF 20-25%, BiV ICD upgrade 2009, chronic atrial fibrillation s/p AVN ablation 9/16, CKD, and traumatic SAH in 1/16 after a fall.  He has been followed at the heart failure clinic at [facility] by Dr Devonne Doughty in the past.   He was admitted in 3/16 from [facility] with exertional dyspnea/volume overload and had a cardiac arrest/ventricular fibrillation while in the hospital terminated by his ICD. After diuresis, RHC showed relatively normal filling pressures and preserved cardiac index.  Creatinine peaked at 3.0. (was 5.0 in 1/16).  Echo in 3/16 showed EF 25-30% with restrictive diastolic function.  Cardiolite was done, showing areas of scar but no ischemia.  EF 18%.    Admitted 4/16 with hematuria and elevated LFTs. Questionable amiodarone toxicity and this was stopped.   In 5/16 underwent elective BiV ICD generator change which went without complication. He was admitted again 04/23/15 with acute on chronic CHF and respiratory failure. During that admission he ruled in for a NSTEMI- Troponin 4.29. He has chronic stage 4 CKD and it was decided to obtain a Myoview before considering a cath. Myoview 04/26/15 showed EF 20% with large region of prior infarct involving the apical anterior, anteroseptal, inferoseptal and inferior walls with extension to the true apex. Small region of reversible/inducible ischemia along the margin of the prior infarct at the mid ventricular anterior and anteroseptal walls. No change from previous.   Redmitted in 9/16 with bleeding from stoma site and atrial fibrillation with RVR.  Eliquis was held and bleeding stopped.  Eliquis was restarted prior to discharge. He was noted to have < 50% BiV pacing and also to be very volume  overloaded. He was then diuresed and had AV nodal ablation.    Readmitted 7/17 with cholangitis and sepsis. Required ERCP with biliary stent. C/b acute on chronic renal failure requiring HD.   Had prolonged hospitalization in 8/18 for MSSA bacteremia/sepsis c/b shock and worsening renal failure. Developed ESRD and HD started. TEE negative for vegetation. EF 15-20%. RV down. Hospitalization c/b severe debility, malnutrition and skin wounds. Was d/c'd to CIR for several weeks. Completed IV ANCEF course.   Here for routine f/u. Tolerating HD M/W/F but makes him tired. The next day he is fine. BP drop a bit with HD but hasn't been too bad. Weight stable at 169. Still working with PT. [name] still giving him trouble. Walking with a walker. No CP. Edema well controlled. On midodrine 15 mg tid. Still on Keflex for prophylaxis. Has wound vac for sacral decub that is almost healed  Corevue: No VT. Impedance ok.    Labs (12/15): LDL 123 Labs (3/16): K 3.8, creatinine 2.78 Labs (03/10/2015): K 4.5 Creatinine 2.49  TSH 4.58, LFTs normal, digoxin 0.8 Labs (04/26/15): K 3.5 creatinine 2.13 Labs (08/04/2015): K 3.3 Creatinine 3.01  Labs (9/16): K 4.1, creatinine 2.7, HCT 30.8 Labs (4/17): K 3.5, creatinine 2.67 Labs (05/12/16): K 3.7, creatinine 2.66 Labs (10/20/16): K 3.6, creatinine 2.71 Labs (01/26/17) K 3.1, creatinine 2.70  PMH: 1. HTN 2. Type II diabetes 3. CAD: s/p BMS LAD in 2001, PTCA ramus and BMS LAD in 2009.  Cardiolite (3/16) with EF 18%, no ischemia, prior anterior, apical and inferior infarction. NSTEMI  in 5/16.  Myoview done 04/26/15 showed EF 20% with large region of prior infarct involving the apical anterior, anteroseptal, inferoseptal and inferior walls with extension to the true apex. Small region of reversible/inducible ischemia along the margin of the prior infarct at the mid ventricular anterior and anteroseptal walls. No change from previous.  4. Chronic systolic CHF: Ischemic CMP.  St Jude  CRT-D.  Echo (3/16) with EF 25-30%, restrictive diastolic function, mild LVH, mild MR.  RHC (3/16) with mean RA 9, PA 47/29, mean PCWP 16, CI 2.5 (Fick).  Suspect ACEI cough. Echo (7/17) with EF 15-20%. Myoview 18%.   5. SAH: 1/16 after fall (traumatic).   6. GERD 7. Atrial fibrillation: Paroxysmal initially but now chronic.  Now s/p AV nodal ablation to allow BiV pacing. 8. Rectal cancer: s/p surgery. Has colostomy.  9. Prostate cancer: s/p chemo/radiation.  10. H/o TIA 11. OSA: On CPAP.  12. Hyperlipidemia 13. CKD stage IV: Followed by Dr Kristian Covey.  14. Bleeding from stoma.   SH: Married, lives in [place], nonsmoker.   FH: CAD  Review of systems complete and found to be negative unless listed in HPI.    Current Outpatient Medications  Medication Sig Dispense Refill  . cephALEXin (KEFLEX) 250 MG capsule Take 250 capsules by mouth 2 (two) times daily. Staph infection    . colchicine 0.6 MG tablet Take 0.5 tablets (0.3 mg total) by mouth 2 (two) times a week. 30 tablet 0  . fexofenadine (ALLEGRA) 180 MG tablet Take 180 mg by mouth daily.    Marland Kitchen glucose blood test strip Use to test blood sugar 3 times daily 100 each 4  . HYDROcodone-acetaminophen (NORCO) 7.5-325 MG tablet Take 1-2 tablets by mouth every 4 (four) hours as needed for moderate pain. 10 tablet 0  . levothyroxine (SYNTHROID, LEVOTHROID) 75 MCG tablet Take 75 mcg by mouth daily before breakfast.    . midodrine (PROAMATINE) 5 MG tablet Take 3 tablets (15 mg total) by mouth 2 (two) times daily with a meal. 180 tablet 3  . NOVOLOG FLEXPEN 100 UNIT/ML FlexPen Inject 3 Units into the skin 3 (three) times daily.  11  . pantoprazole (PROTONIX) 40 MG tablet Take 1 tablet (40 mg total) by mouth daily. 30 tablet 0  . PROAIR RESPICLICK 108 (90 BASE) MCG/ACT AEPB Inhale 1 puff into the lungs every 6 (six) hours as needed (for breathing).   0  . sucroferric oxyhydroxide (VELPHORO) 500 MG chewable tablet Chew 1 tablet (500 mg total) by  mouth 3 (three) times daily with meals. 90 tablet 0  . warfarin (COUMADIN) 1 MG tablet Take 1 tablet (1 mg total) by mouth daily. 30 tablet 3  . nitroGLYCERIN (NITROSTAT) 0.4 MG SL tablet Place 1 tablet (0.4 mg total) under the tongue every 5 (five) minutes as needed for chest pain. (Patient not taking: Reported on 11/02/2017) 25 tablet 3   No current facility-administered medications for this encounter.     Vitals:   12/28/17 0910  BP: (!) 98/58  Pulse: 73  SpO2: 95%  Weight: 169 lb 6.4 oz (76.8 kg)   Wt Readings from Last 3 Encounters:  12/28/17 169 lb 6.4 oz (76.8 kg)  11/02/17 160 lb (72.6 kg)  09/14/17 155 lb (70.3 kg)   Physical Exam General:  Sitting in WC. No resp difficulty HEENT: normal Neck: supple. JVP 10 + prominent CV waves. Carotids 2+ bilat; no bruits. No lymphadenopathy or thryomegaly appreciated. Cor: PMI nondisplaced. Regular rate & rhythm. 2/6 MR  Lungs: clear Abdomen: soft, nontender, nondistended. No hepatosplenomegaly. No bruits or masses. Good bowel sounds. LLE colostomy ok. Sacral wound with wound vac Extremities: no cyanosis, clubbing, rash, 1-2+ edema wrapped with gauze RUE AVF Neuro: alert & orientedx3, cranial nerves grossly intact. moves all 4 extremities w/o difficulty. Affect pleasant   Assessment/Plan: 1. Chronic systolic CHF: Ischemic cardiomyopathy.  - Echo 07/2017 with LVEF 15-20%, - Improving slowly. NYHA III.  - Volume status managed with HD. Looks good on exam and by Corevue. - Off HF meds due to ESRD and low BP. Continue midodrine - Continue HHPT for now. Not candidate for advanced therapies.  - Repeat echo in 3 months 2. Atrial fibrillation: Chronic. CHADS2Vasc Score 5. s/p AV nodal ablation -  s/p AV node ablation with CRT - On warfarin. Denies bleeding.  4. ESRD - As above. Volume mildly elevated. Continue M/W/F. Likely not a candidate for PD with colostomy - Continue midodrine  15 mg BID per renal.  5. CAD:  NSTEMI in 5/16.  -  Cardiolite without high ischemic burden. EF is decreased from previous, but poor cath candidate with renal failure. - No ASA given AC - No s/s ischemia - Resume statin 6. H/O rectal cancer 2008: Has diverting colostomy. Follows with Dr. Truett Perna.  - CT (05/02/16) without evidence of reoccurrence.  7. H/o staph bacteremia - continue life-long Keflex  Arvilla Meres, MD  12/28/17

## 2017-12-29 DIAGNOSIS — L89153 Pressure ulcer of sacral region, stage 3: Secondary | ICD-10-CM | POA: Diagnosis not present

## 2017-12-29 DIAGNOSIS — I120 Hypertensive chronic kidney disease with stage 5 chronic kidney disease or end stage renal disease: Secondary | ICD-10-CM | POA: Diagnosis not present

## 2017-12-29 DIAGNOSIS — E1122 Type 2 diabetes mellitus with diabetic chronic kidney disease: Secondary | ICD-10-CM | POA: Diagnosis not present

## 2017-12-29 DIAGNOSIS — L89610 Pressure ulcer of right heel, unstageable: Secondary | ICD-10-CM | POA: Diagnosis not present

## 2017-12-29 DIAGNOSIS — L89620 Pressure ulcer of left heel, unstageable: Secondary | ICD-10-CM | POA: Diagnosis not present

## 2017-12-29 DIAGNOSIS — L89890 Pressure ulcer of other site, unstageable: Secondary | ICD-10-CM | POA: Diagnosis not present

## 2017-12-30 ENCOUNTER — Ambulatory Visit (INDEPENDENT_AMBULATORY_CARE_PROVIDER_SITE_OTHER): Payer: Medicare Other | Admitting: Cardiology

## 2017-12-30 DIAGNOSIS — I482 Chronic atrial fibrillation, unspecified: Secondary | ICD-10-CM

## 2017-12-30 DIAGNOSIS — L89620 Pressure ulcer of left heel, unstageable: Secondary | ICD-10-CM | POA: Diagnosis not present

## 2017-12-30 DIAGNOSIS — N186 End stage renal disease: Secondary | ICD-10-CM | POA: Diagnosis not present

## 2017-12-30 DIAGNOSIS — Z7901 Long term (current) use of anticoagulants: Secondary | ICD-10-CM

## 2017-12-30 DIAGNOSIS — L89153 Pressure ulcer of sacral region, stage 3: Secondary | ICD-10-CM | POA: Diagnosis not present

## 2017-12-30 DIAGNOSIS — E1122 Type 2 diabetes mellitus with diabetic chronic kidney disease: Secondary | ICD-10-CM | POA: Diagnosis not present

## 2017-12-30 DIAGNOSIS — L89610 Pressure ulcer of right heel, unstageable: Secondary | ICD-10-CM | POA: Diagnosis not present

## 2017-12-30 DIAGNOSIS — D509 Iron deficiency anemia, unspecified: Secondary | ICD-10-CM | POA: Diagnosis not present

## 2017-12-30 DIAGNOSIS — D631 Anemia in chronic kidney disease: Secondary | ICD-10-CM | POA: Diagnosis not present

## 2017-12-30 DIAGNOSIS — L89890 Pressure ulcer of other site, unstageable: Secondary | ICD-10-CM | POA: Diagnosis not present

## 2017-12-30 DIAGNOSIS — Z992 Dependence on renal dialysis: Secondary | ICD-10-CM | POA: Diagnosis not present

## 2017-12-30 DIAGNOSIS — I120 Hypertensive chronic kidney disease with stage 5 chronic kidney disease or end stage renal disease: Secondary | ICD-10-CM | POA: Diagnosis not present

## 2017-12-30 DIAGNOSIS — N2581 Secondary hyperparathyroidism of renal origin: Secondary | ICD-10-CM | POA: Diagnosis not present

## 2017-12-30 LAB — POCT INR: INR: 2.2

## 2017-12-30 NOTE — Patient Instructions (Signed)
Description   Continue coumadin 1 tablet daily. Recheck in 2 weeks.  Order given to Christie Beckers RN [facility]

## 2018-01-02 ENCOUNTER — Encounter (HOSPITAL_COMMUNITY): Payer: Medicare Other | Admitting: Internal Medicine

## 2018-01-02 DIAGNOSIS — Z992 Dependence on renal dialysis: Secondary | ICD-10-CM | POA: Diagnosis not present

## 2018-01-02 DIAGNOSIS — L89890 Pressure ulcer of other site, unstageable: Secondary | ICD-10-CM | POA: Diagnosis not present

## 2018-01-02 DIAGNOSIS — L89153 Pressure ulcer of sacral region, stage 3: Secondary | ICD-10-CM | POA: Diagnosis not present

## 2018-01-02 DIAGNOSIS — I120 Hypertensive chronic kidney disease with stage 5 chronic kidney disease or end stage renal disease: Secondary | ICD-10-CM | POA: Diagnosis not present

## 2018-01-02 DIAGNOSIS — E1122 Type 2 diabetes mellitus with diabetic chronic kidney disease: Secondary | ICD-10-CM | POA: Diagnosis not present

## 2018-01-02 DIAGNOSIS — L89620 Pressure ulcer of left heel, unstageable: Secondary | ICD-10-CM | POA: Diagnosis not present

## 2018-01-02 DIAGNOSIS — D631 Anemia in chronic kidney disease: Secondary | ICD-10-CM | POA: Diagnosis not present

## 2018-01-02 DIAGNOSIS — D509 Iron deficiency anemia, unspecified: Secondary | ICD-10-CM | POA: Diagnosis not present

## 2018-01-02 DIAGNOSIS — N186 End stage renal disease: Secondary | ICD-10-CM | POA: Diagnosis not present

## 2018-01-02 DIAGNOSIS — L89610 Pressure ulcer of right heel, unstageable: Secondary | ICD-10-CM | POA: Diagnosis not present

## 2018-01-02 DIAGNOSIS — N2581 Secondary hyperparathyroidism of renal origin: Secondary | ICD-10-CM | POA: Diagnosis not present

## 2018-01-03 DIAGNOSIS — L89610 Pressure ulcer of right heel, unstageable: Secondary | ICD-10-CM | POA: Diagnosis not present

## 2018-01-03 DIAGNOSIS — L89620 Pressure ulcer of left heel, unstageable: Secondary | ICD-10-CM | POA: Diagnosis not present

## 2018-01-03 DIAGNOSIS — L89153 Pressure ulcer of sacral region, stage 3: Secondary | ICD-10-CM | POA: Diagnosis not present

## 2018-01-03 DIAGNOSIS — L89890 Pressure ulcer of other site, unstageable: Secondary | ICD-10-CM | POA: Diagnosis not present

## 2018-01-03 DIAGNOSIS — I120 Hypertensive chronic kidney disease with stage 5 chronic kidney disease or end stage renal disease: Secondary | ICD-10-CM | POA: Diagnosis not present

## 2018-01-03 DIAGNOSIS — E1122 Type 2 diabetes mellitus with diabetic chronic kidney disease: Secondary | ICD-10-CM | POA: Diagnosis not present

## 2018-01-04 DIAGNOSIS — N2581 Secondary hyperparathyroidism of renal origin: Secondary | ICD-10-CM | POA: Diagnosis not present

## 2018-01-04 DIAGNOSIS — E1122 Type 2 diabetes mellitus with diabetic chronic kidney disease: Secondary | ICD-10-CM | POA: Diagnosis not present

## 2018-01-04 DIAGNOSIS — L89890 Pressure ulcer of other site, unstageable: Secondary | ICD-10-CM | POA: Diagnosis not present

## 2018-01-04 DIAGNOSIS — L89620 Pressure ulcer of left heel, unstageable: Secondary | ICD-10-CM | POA: Diagnosis not present

## 2018-01-04 DIAGNOSIS — N186 End stage renal disease: Secondary | ICD-10-CM | POA: Diagnosis not present

## 2018-01-04 DIAGNOSIS — Z992 Dependence on renal dialysis: Secondary | ICD-10-CM | POA: Diagnosis not present

## 2018-01-04 DIAGNOSIS — D631 Anemia in chronic kidney disease: Secondary | ICD-10-CM | POA: Diagnosis not present

## 2018-01-04 DIAGNOSIS — D509 Iron deficiency anemia, unspecified: Secondary | ICD-10-CM | POA: Diagnosis not present

## 2018-01-04 DIAGNOSIS — I120 Hypertensive chronic kidney disease with stage 5 chronic kidney disease or end stage renal disease: Secondary | ICD-10-CM | POA: Diagnosis not present

## 2018-01-04 DIAGNOSIS — L89610 Pressure ulcer of right heel, unstageable: Secondary | ICD-10-CM | POA: Diagnosis not present

## 2018-01-04 DIAGNOSIS — L89153 Pressure ulcer of sacral region, stage 3: Secondary | ICD-10-CM | POA: Diagnosis not present

## 2018-01-05 DIAGNOSIS — L89620 Pressure ulcer of left heel, unstageable: Secondary | ICD-10-CM | POA: Diagnosis not present

## 2018-01-05 DIAGNOSIS — L89153 Pressure ulcer of sacral region, stage 3: Secondary | ICD-10-CM | POA: Diagnosis not present

## 2018-01-05 DIAGNOSIS — L89610 Pressure ulcer of right heel, unstageable: Secondary | ICD-10-CM | POA: Diagnosis not present

## 2018-01-05 DIAGNOSIS — L89890 Pressure ulcer of other site, unstageable: Secondary | ICD-10-CM | POA: Diagnosis not present

## 2018-01-05 DIAGNOSIS — E1122 Type 2 diabetes mellitus with diabetic chronic kidney disease: Secondary | ICD-10-CM | POA: Diagnosis not present

## 2018-01-05 DIAGNOSIS — I120 Hypertensive chronic kidney disease with stage 5 chronic kidney disease or end stage renal disease: Secondary | ICD-10-CM | POA: Diagnosis not present

## 2018-01-05 DIAGNOSIS — Z992 Dependence on renal dialysis: Secondary | ICD-10-CM | POA: Diagnosis not present

## 2018-01-05 DIAGNOSIS — N186 End stage renal disease: Secondary | ICD-10-CM | POA: Diagnosis not present

## 2018-01-06 DIAGNOSIS — L89890 Pressure ulcer of other site, unstageable: Secondary | ICD-10-CM | POA: Diagnosis not present

## 2018-01-06 DIAGNOSIS — Z992 Dependence on renal dialysis: Secondary | ICD-10-CM | POA: Diagnosis not present

## 2018-01-06 DIAGNOSIS — L89610 Pressure ulcer of right heel, unstageable: Secondary | ICD-10-CM | POA: Diagnosis not present

## 2018-01-06 DIAGNOSIS — L89620 Pressure ulcer of left heel, unstageable: Secondary | ICD-10-CM | POA: Diagnosis not present

## 2018-01-06 DIAGNOSIS — I120 Hypertensive chronic kidney disease with stage 5 chronic kidney disease or end stage renal disease: Secondary | ICD-10-CM | POA: Diagnosis not present

## 2018-01-06 DIAGNOSIS — E1122 Type 2 diabetes mellitus with diabetic chronic kidney disease: Secondary | ICD-10-CM | POA: Diagnosis not present

## 2018-01-06 DIAGNOSIS — D509 Iron deficiency anemia, unspecified: Secondary | ICD-10-CM | POA: Diagnosis not present

## 2018-01-06 DIAGNOSIS — N186 End stage renal disease: Secondary | ICD-10-CM | POA: Diagnosis not present

## 2018-01-06 DIAGNOSIS — D631 Anemia in chronic kidney disease: Secondary | ICD-10-CM | POA: Diagnosis not present

## 2018-01-06 DIAGNOSIS — L89153 Pressure ulcer of sacral region, stage 3: Secondary | ICD-10-CM | POA: Diagnosis not present

## 2018-01-09 DIAGNOSIS — N186 End stage renal disease: Secondary | ICD-10-CM | POA: Diagnosis not present

## 2018-01-09 DIAGNOSIS — Z992 Dependence on renal dialysis: Secondary | ICD-10-CM | POA: Diagnosis not present

## 2018-01-09 DIAGNOSIS — L89610 Pressure ulcer of right heel, unstageable: Secondary | ICD-10-CM | POA: Diagnosis not present

## 2018-01-09 DIAGNOSIS — L89890 Pressure ulcer of other site, unstageable: Secondary | ICD-10-CM | POA: Diagnosis not present

## 2018-01-09 DIAGNOSIS — L89153 Pressure ulcer of sacral region, stage 3: Secondary | ICD-10-CM | POA: Diagnosis not present

## 2018-01-09 DIAGNOSIS — D509 Iron deficiency anemia, unspecified: Secondary | ICD-10-CM | POA: Diagnosis not present

## 2018-01-09 DIAGNOSIS — L89620 Pressure ulcer of left heel, unstageable: Secondary | ICD-10-CM | POA: Diagnosis not present

## 2018-01-09 DIAGNOSIS — I120 Hypertensive chronic kidney disease with stage 5 chronic kidney disease or end stage renal disease: Secondary | ICD-10-CM | POA: Diagnosis not present

## 2018-01-09 DIAGNOSIS — D631 Anemia in chronic kidney disease: Secondary | ICD-10-CM | POA: Diagnosis not present

## 2018-01-09 DIAGNOSIS — E1122 Type 2 diabetes mellitus with diabetic chronic kidney disease: Secondary | ICD-10-CM | POA: Diagnosis not present

## 2018-01-10 ENCOUNTER — Other Ambulatory Visit: Payer: Medicare Other

## 2018-01-10 ENCOUNTER — Ambulatory Visit: Payer: Medicare Other | Admitting: Oncology

## 2018-01-10 DIAGNOSIS — L89890 Pressure ulcer of other site, unstageable: Secondary | ICD-10-CM | POA: Diagnosis not present

## 2018-01-10 DIAGNOSIS — L89620 Pressure ulcer of left heel, unstageable: Secondary | ICD-10-CM | POA: Diagnosis not present

## 2018-01-10 DIAGNOSIS — I120 Hypertensive chronic kidney disease with stage 5 chronic kidney disease or end stage renal disease: Secondary | ICD-10-CM | POA: Diagnosis not present

## 2018-01-10 DIAGNOSIS — L89610 Pressure ulcer of right heel, unstageable: Secondary | ICD-10-CM | POA: Diagnosis not present

## 2018-01-10 DIAGNOSIS — E1122 Type 2 diabetes mellitus with diabetic chronic kidney disease: Secondary | ICD-10-CM | POA: Diagnosis not present

## 2018-01-10 DIAGNOSIS — L89153 Pressure ulcer of sacral region, stage 3: Secondary | ICD-10-CM | POA: Diagnosis not present

## 2018-01-11 ENCOUNTER — Telehealth: Payer: Self-pay | Admitting: *Deleted

## 2018-01-11 ENCOUNTER — Ambulatory Visit (INDEPENDENT_AMBULATORY_CARE_PROVIDER_SITE_OTHER): Payer: Medicare Other | Admitting: *Deleted

## 2018-01-11 DIAGNOSIS — I120 Hypertensive chronic kidney disease with stage 5 chronic kidney disease or end stage renal disease: Secondary | ICD-10-CM | POA: Diagnosis not present

## 2018-01-11 DIAGNOSIS — I482 Chronic atrial fibrillation, unspecified: Secondary | ICD-10-CM

## 2018-01-11 DIAGNOSIS — L89153 Pressure ulcer of sacral region, stage 3: Secondary | ICD-10-CM | POA: Diagnosis not present

## 2018-01-11 DIAGNOSIS — Z7901 Long term (current) use of anticoagulants: Secondary | ICD-10-CM

## 2018-01-11 DIAGNOSIS — Z5181 Encounter for therapeutic drug level monitoring: Secondary | ICD-10-CM

## 2018-01-11 DIAGNOSIS — L89890 Pressure ulcer of other site, unstageable: Secondary | ICD-10-CM | POA: Diagnosis not present

## 2018-01-11 DIAGNOSIS — L89620 Pressure ulcer of left heel, unstageable: Secondary | ICD-10-CM | POA: Diagnosis not present

## 2018-01-11 DIAGNOSIS — D509 Iron deficiency anemia, unspecified: Secondary | ICD-10-CM | POA: Diagnosis not present

## 2018-01-11 DIAGNOSIS — Z992 Dependence on renal dialysis: Secondary | ICD-10-CM | POA: Diagnosis not present

## 2018-01-11 DIAGNOSIS — N186 End stage renal disease: Secondary | ICD-10-CM | POA: Diagnosis not present

## 2018-01-11 DIAGNOSIS — L89610 Pressure ulcer of right heel, unstageable: Secondary | ICD-10-CM | POA: Diagnosis not present

## 2018-01-11 DIAGNOSIS — E1122 Type 2 diabetes mellitus with diabetic chronic kidney disease: Secondary | ICD-10-CM | POA: Diagnosis not present

## 2018-01-11 DIAGNOSIS — D631 Anemia in chronic kidney disease: Secondary | ICD-10-CM | POA: Diagnosis not present

## 2018-01-11 LAB — POCT INR: INR: 2.2

## 2018-01-11 NOTE — Telephone Encounter (Signed)
Done.  See coumadin note. 

## 2018-01-11 NOTE — Telephone Encounter (Signed)
INR 2.2 forgot to take dose last night [phone] per [name]

## 2018-01-11 NOTE — Patient Instructions (Signed)
Continue coumadin 1 tablet daily.  Recheck in 2 weeks.   Order given to [employer] [facility]

## 2018-01-12 ENCOUNTER — Other Ambulatory Visit: Payer: Self-pay | Admitting: Internal Medicine

## 2018-01-12 DIAGNOSIS — E1122 Type 2 diabetes mellitus with diabetic chronic kidney disease: Secondary | ICD-10-CM | POA: Diagnosis not present

## 2018-01-12 DIAGNOSIS — L89890 Pressure ulcer of other site, unstageable: Secondary | ICD-10-CM | POA: Diagnosis not present

## 2018-01-12 DIAGNOSIS — L89610 Pressure ulcer of right heel, unstageable: Secondary | ICD-10-CM | POA: Diagnosis not present

## 2018-01-12 DIAGNOSIS — L89153 Pressure ulcer of sacral region, stage 3: Secondary | ICD-10-CM | POA: Diagnosis not present

## 2018-01-12 DIAGNOSIS — I120 Hypertensive chronic kidney disease with stage 5 chronic kidney disease or end stage renal disease: Secondary | ICD-10-CM | POA: Diagnosis not present

## 2018-01-12 DIAGNOSIS — L89620 Pressure ulcer of left heel, unstageable: Secondary | ICD-10-CM | POA: Diagnosis not present

## 2018-01-13 ENCOUNTER — Ambulatory Visit: Payer: Medicare Other | Admitting: Internal Medicine

## 2018-01-13 DIAGNOSIS — L89153 Pressure ulcer of sacral region, stage 3: Secondary | ICD-10-CM | POA: Diagnosis not present

## 2018-01-13 DIAGNOSIS — L89890 Pressure ulcer of other site, unstageable: Secondary | ICD-10-CM | POA: Diagnosis not present

## 2018-01-13 DIAGNOSIS — D509 Iron deficiency anemia, unspecified: Secondary | ICD-10-CM | POA: Diagnosis not present

## 2018-01-13 DIAGNOSIS — L89620 Pressure ulcer of left heel, unstageable: Secondary | ICD-10-CM | POA: Diagnosis not present

## 2018-01-13 DIAGNOSIS — Z992 Dependence on renal dialysis: Secondary | ICD-10-CM | POA: Diagnosis not present

## 2018-01-13 DIAGNOSIS — E1122 Type 2 diabetes mellitus with diabetic chronic kidney disease: Secondary | ICD-10-CM | POA: Diagnosis not present

## 2018-01-13 DIAGNOSIS — I120 Hypertensive chronic kidney disease with stage 5 chronic kidney disease or end stage renal disease: Secondary | ICD-10-CM | POA: Diagnosis not present

## 2018-01-13 DIAGNOSIS — L89610 Pressure ulcer of right heel, unstageable: Secondary | ICD-10-CM | POA: Diagnosis not present

## 2018-01-13 DIAGNOSIS — N186 End stage renal disease: Secondary | ICD-10-CM | POA: Diagnosis not present

## 2018-01-13 DIAGNOSIS — D631 Anemia in chronic kidney disease: Secondary | ICD-10-CM | POA: Diagnosis not present

## 2018-01-16 DIAGNOSIS — E1122 Type 2 diabetes mellitus with diabetic chronic kidney disease: Secondary | ICD-10-CM | POA: Diagnosis not present

## 2018-01-16 DIAGNOSIS — D631 Anemia in chronic kidney disease: Secondary | ICD-10-CM | POA: Diagnosis not present

## 2018-01-16 DIAGNOSIS — N186 End stage renal disease: Secondary | ICD-10-CM | POA: Diagnosis not present

## 2018-01-16 DIAGNOSIS — L89153 Pressure ulcer of sacral region, stage 3: Secondary | ICD-10-CM | POA: Diagnosis not present

## 2018-01-16 DIAGNOSIS — L89610 Pressure ulcer of right heel, unstageable: Secondary | ICD-10-CM | POA: Diagnosis not present

## 2018-01-16 DIAGNOSIS — L89620 Pressure ulcer of left heel, unstageable: Secondary | ICD-10-CM | POA: Diagnosis not present

## 2018-01-16 DIAGNOSIS — L89890 Pressure ulcer of other site, unstageable: Secondary | ICD-10-CM | POA: Diagnosis not present

## 2018-01-16 DIAGNOSIS — Z992 Dependence on renal dialysis: Secondary | ICD-10-CM | POA: Diagnosis not present

## 2018-01-16 DIAGNOSIS — D509 Iron deficiency anemia, unspecified: Secondary | ICD-10-CM | POA: Diagnosis not present

## 2018-01-16 DIAGNOSIS — I120 Hypertensive chronic kidney disease with stage 5 chronic kidney disease or end stage renal disease: Secondary | ICD-10-CM | POA: Diagnosis not present

## 2018-01-17 DIAGNOSIS — E1122 Type 2 diabetes mellitus with diabetic chronic kidney disease: Secondary | ICD-10-CM | POA: Diagnosis not present

## 2018-01-17 DIAGNOSIS — L89610 Pressure ulcer of right heel, unstageable: Secondary | ICD-10-CM | POA: Diagnosis not present

## 2018-01-17 DIAGNOSIS — L89890 Pressure ulcer of other site, unstageable: Secondary | ICD-10-CM | POA: Diagnosis not present

## 2018-01-17 DIAGNOSIS — L89620 Pressure ulcer of left heel, unstageable: Secondary | ICD-10-CM | POA: Diagnosis not present

## 2018-01-17 DIAGNOSIS — L89153 Pressure ulcer of sacral region, stage 3: Secondary | ICD-10-CM | POA: Diagnosis not present

## 2018-01-17 DIAGNOSIS — I120 Hypertensive chronic kidney disease with stage 5 chronic kidney disease or end stage renal disease: Secondary | ICD-10-CM | POA: Diagnosis not present

## 2018-01-18 DIAGNOSIS — D509 Iron deficiency anemia, unspecified: Secondary | ICD-10-CM | POA: Diagnosis not present

## 2018-01-18 DIAGNOSIS — Z992 Dependence on renal dialysis: Secondary | ICD-10-CM | POA: Diagnosis not present

## 2018-01-18 DIAGNOSIS — D631 Anemia in chronic kidney disease: Secondary | ICD-10-CM | POA: Diagnosis not present

## 2018-01-18 DIAGNOSIS — L89890 Pressure ulcer of other site, unstageable: Secondary | ICD-10-CM | POA: Diagnosis not present

## 2018-01-18 DIAGNOSIS — L89153 Pressure ulcer of sacral region, stage 3: Secondary | ICD-10-CM | POA: Diagnosis not present

## 2018-01-18 DIAGNOSIS — L89620 Pressure ulcer of left heel, unstageable: Secondary | ICD-10-CM | POA: Diagnosis not present

## 2018-01-18 DIAGNOSIS — I120 Hypertensive chronic kidney disease with stage 5 chronic kidney disease or end stage renal disease: Secondary | ICD-10-CM | POA: Diagnosis not present

## 2018-01-18 DIAGNOSIS — L89610 Pressure ulcer of right heel, unstageable: Secondary | ICD-10-CM | POA: Diagnosis not present

## 2018-01-18 DIAGNOSIS — N186 End stage renal disease: Secondary | ICD-10-CM | POA: Diagnosis not present

## 2018-01-18 DIAGNOSIS — E1122 Type 2 diabetes mellitus with diabetic chronic kidney disease: Secondary | ICD-10-CM | POA: Diagnosis not present

## 2018-01-19 DIAGNOSIS — L89153 Pressure ulcer of sacral region, stage 3: Secondary | ICD-10-CM | POA: Diagnosis not present

## 2018-01-19 DIAGNOSIS — L89620 Pressure ulcer of left heel, unstageable: Secondary | ICD-10-CM | POA: Diagnosis not present

## 2018-01-19 DIAGNOSIS — L89610 Pressure ulcer of right heel, unstageable: Secondary | ICD-10-CM | POA: Diagnosis not present

## 2018-01-19 DIAGNOSIS — E1122 Type 2 diabetes mellitus with diabetic chronic kidney disease: Secondary | ICD-10-CM | POA: Diagnosis not present

## 2018-01-19 DIAGNOSIS — L89890 Pressure ulcer of other site, unstageable: Secondary | ICD-10-CM | POA: Diagnosis not present

## 2018-01-19 DIAGNOSIS — I120 Hypertensive chronic kidney disease with stage 5 chronic kidney disease or end stage renal disease: Secondary | ICD-10-CM | POA: Diagnosis not present

## 2018-01-20 DIAGNOSIS — E1122 Type 2 diabetes mellitus with diabetic chronic kidney disease: Secondary | ICD-10-CM | POA: Diagnosis not present

## 2018-01-20 DIAGNOSIS — L89890 Pressure ulcer of other site, unstageable: Secondary | ICD-10-CM | POA: Diagnosis not present

## 2018-01-20 DIAGNOSIS — L89610 Pressure ulcer of right heel, unstageable: Secondary | ICD-10-CM | POA: Diagnosis not present

## 2018-01-20 DIAGNOSIS — D509 Iron deficiency anemia, unspecified: Secondary | ICD-10-CM | POA: Diagnosis not present

## 2018-01-20 DIAGNOSIS — Z992 Dependence on renal dialysis: Secondary | ICD-10-CM | POA: Diagnosis not present

## 2018-01-20 DIAGNOSIS — L89620 Pressure ulcer of left heel, unstageable: Secondary | ICD-10-CM | POA: Diagnosis not present

## 2018-01-20 DIAGNOSIS — I120 Hypertensive chronic kidney disease with stage 5 chronic kidney disease or end stage renal disease: Secondary | ICD-10-CM | POA: Diagnosis not present

## 2018-01-20 DIAGNOSIS — L89153 Pressure ulcer of sacral region, stage 3: Secondary | ICD-10-CM | POA: Diagnosis not present

## 2018-01-20 DIAGNOSIS — N186 End stage renal disease: Secondary | ICD-10-CM | POA: Diagnosis not present

## 2018-01-20 DIAGNOSIS — D631 Anemia in chronic kidney disease: Secondary | ICD-10-CM | POA: Diagnosis not present

## 2018-01-23 DIAGNOSIS — N186 End stage renal disease: Secondary | ICD-10-CM | POA: Diagnosis not present

## 2018-01-23 DIAGNOSIS — Z992 Dependence on renal dialysis: Secondary | ICD-10-CM | POA: Diagnosis not present

## 2018-01-23 DIAGNOSIS — L89610 Pressure ulcer of right heel, unstageable: Secondary | ICD-10-CM | POA: Diagnosis not present

## 2018-01-23 DIAGNOSIS — L89620 Pressure ulcer of left heel, unstageable: Secondary | ICD-10-CM | POA: Diagnosis not present

## 2018-01-23 DIAGNOSIS — L89890 Pressure ulcer of other site, unstageable: Secondary | ICD-10-CM | POA: Diagnosis not present

## 2018-01-23 DIAGNOSIS — I120 Hypertensive chronic kidney disease with stage 5 chronic kidney disease or end stage renal disease: Secondary | ICD-10-CM | POA: Diagnosis not present

## 2018-01-23 DIAGNOSIS — D509 Iron deficiency anemia, unspecified: Secondary | ICD-10-CM | POA: Diagnosis not present

## 2018-01-23 DIAGNOSIS — D631 Anemia in chronic kidney disease: Secondary | ICD-10-CM | POA: Diagnosis not present

## 2018-01-23 DIAGNOSIS — L89153 Pressure ulcer of sacral region, stage 3: Secondary | ICD-10-CM | POA: Diagnosis not present

## 2018-01-23 DIAGNOSIS — E1122 Type 2 diabetes mellitus with diabetic chronic kidney disease: Secondary | ICD-10-CM | POA: Diagnosis not present

## 2018-01-24 DIAGNOSIS — L89890 Pressure ulcer of other site, unstageable: Secondary | ICD-10-CM | POA: Diagnosis not present

## 2018-01-24 DIAGNOSIS — L89620 Pressure ulcer of left heel, unstageable: Secondary | ICD-10-CM | POA: Diagnosis not present

## 2018-01-24 DIAGNOSIS — E1122 Type 2 diabetes mellitus with diabetic chronic kidney disease: Secondary | ICD-10-CM | POA: Diagnosis not present

## 2018-01-24 DIAGNOSIS — L89153 Pressure ulcer of sacral region, stage 3: Secondary | ICD-10-CM | POA: Diagnosis not present

## 2018-01-24 DIAGNOSIS — I120 Hypertensive chronic kidney disease with stage 5 chronic kidney disease or end stage renal disease: Secondary | ICD-10-CM | POA: Diagnosis not present

## 2018-01-24 DIAGNOSIS — L89610 Pressure ulcer of right heel, unstageable: Secondary | ICD-10-CM | POA: Diagnosis not present

## 2018-01-25 ENCOUNTER — Ambulatory Visit (INDEPENDENT_AMBULATORY_CARE_PROVIDER_SITE_OTHER): Payer: Medicare Other | Admitting: *Deleted

## 2018-01-25 ENCOUNTER — Telehealth: Payer: Self-pay | Admitting: *Deleted

## 2018-01-25 DIAGNOSIS — I482 Chronic atrial fibrillation, unspecified: Secondary | ICD-10-CM

## 2018-01-25 DIAGNOSIS — L89610 Pressure ulcer of right heel, unstageable: Secondary | ICD-10-CM | POA: Diagnosis not present

## 2018-01-25 DIAGNOSIS — N186 End stage renal disease: Secondary | ICD-10-CM | POA: Diagnosis not present

## 2018-01-25 DIAGNOSIS — D509 Iron deficiency anemia, unspecified: Secondary | ICD-10-CM | POA: Diagnosis not present

## 2018-01-25 DIAGNOSIS — Z7901 Long term (current) use of anticoagulants: Secondary | ICD-10-CM

## 2018-01-25 DIAGNOSIS — L89153 Pressure ulcer of sacral region, stage 3: Secondary | ICD-10-CM | POA: Diagnosis not present

## 2018-01-25 DIAGNOSIS — L89890 Pressure ulcer of other site, unstageable: Secondary | ICD-10-CM | POA: Diagnosis not present

## 2018-01-25 DIAGNOSIS — Z992 Dependence on renal dialysis: Secondary | ICD-10-CM | POA: Diagnosis not present

## 2018-01-25 DIAGNOSIS — E1122 Type 2 diabetes mellitus with diabetic chronic kidney disease: Secondary | ICD-10-CM | POA: Diagnosis not present

## 2018-01-25 DIAGNOSIS — L89620 Pressure ulcer of left heel, unstageable: Secondary | ICD-10-CM | POA: Diagnosis not present

## 2018-01-25 DIAGNOSIS — I120 Hypertensive chronic kidney disease with stage 5 chronic kidney disease or end stage renal disease: Secondary | ICD-10-CM | POA: Diagnosis not present

## 2018-01-25 DIAGNOSIS — D631 Anemia in chronic kidney disease: Secondary | ICD-10-CM | POA: Diagnosis not present

## 2018-01-25 LAB — POCT INR: INR: 3.2

## 2018-01-25 NOTE — Telephone Encounter (Signed)
INR 3.2 / please call with instructions/tg  °

## 2018-01-25 NOTE — Telephone Encounter (Signed)
Done.  See coumadin note. 

## 2018-01-25 NOTE — Patient Instructions (Signed)
Hold coumadin tonight then resume 1 tablet daily.  Recheck in 1 week  Order given to [place]

## 2018-01-26 DIAGNOSIS — L89620 Pressure ulcer of left heel, unstageable: Secondary | ICD-10-CM | POA: Diagnosis not present

## 2018-01-26 DIAGNOSIS — L89890 Pressure ulcer of other site, unstageable: Secondary | ICD-10-CM | POA: Diagnosis not present

## 2018-01-26 DIAGNOSIS — E1122 Type 2 diabetes mellitus with diabetic chronic kidney disease: Secondary | ICD-10-CM | POA: Diagnosis not present

## 2018-01-26 DIAGNOSIS — I120 Hypertensive chronic kidney disease with stage 5 chronic kidney disease or end stage renal disease: Secondary | ICD-10-CM | POA: Diagnosis not present

## 2018-01-26 DIAGNOSIS — L89153 Pressure ulcer of sacral region, stage 3: Secondary | ICD-10-CM | POA: Diagnosis not present

## 2018-01-26 DIAGNOSIS — L89610 Pressure ulcer of right heel, unstageable: Secondary | ICD-10-CM | POA: Diagnosis not present

## 2018-01-27 DIAGNOSIS — L89890 Pressure ulcer of other site, unstageable: Secondary | ICD-10-CM | POA: Diagnosis not present

## 2018-01-27 DIAGNOSIS — Z992 Dependence on renal dialysis: Secondary | ICD-10-CM | POA: Diagnosis not present

## 2018-01-27 DIAGNOSIS — L89620 Pressure ulcer of left heel, unstageable: Secondary | ICD-10-CM | POA: Diagnosis not present

## 2018-01-27 DIAGNOSIS — D631 Anemia in chronic kidney disease: Secondary | ICD-10-CM | POA: Diagnosis not present

## 2018-01-27 DIAGNOSIS — E1122 Type 2 diabetes mellitus with diabetic chronic kidney disease: Secondary | ICD-10-CM | POA: Diagnosis not present

## 2018-01-27 DIAGNOSIS — I120 Hypertensive chronic kidney disease with stage 5 chronic kidney disease or end stage renal disease: Secondary | ICD-10-CM | POA: Diagnosis not present

## 2018-01-27 DIAGNOSIS — L89153 Pressure ulcer of sacral region, stage 3: Secondary | ICD-10-CM | POA: Diagnosis not present

## 2018-01-27 DIAGNOSIS — N186 End stage renal disease: Secondary | ICD-10-CM | POA: Diagnosis not present

## 2018-01-27 DIAGNOSIS — D509 Iron deficiency anemia, unspecified: Secondary | ICD-10-CM | POA: Diagnosis not present

## 2018-01-27 DIAGNOSIS — L89610 Pressure ulcer of right heel, unstageable: Secondary | ICD-10-CM | POA: Diagnosis not present

## 2018-01-30 DIAGNOSIS — E1122 Type 2 diabetes mellitus with diabetic chronic kidney disease: Secondary | ICD-10-CM | POA: Diagnosis not present

## 2018-01-30 DIAGNOSIS — I120 Hypertensive chronic kidney disease with stage 5 chronic kidney disease or end stage renal disease: Secondary | ICD-10-CM | POA: Diagnosis not present

## 2018-01-30 DIAGNOSIS — D509 Iron deficiency anemia, unspecified: Secondary | ICD-10-CM | POA: Diagnosis not present

## 2018-01-30 DIAGNOSIS — N186 End stage renal disease: Secondary | ICD-10-CM | POA: Diagnosis not present

## 2018-01-30 DIAGNOSIS — Z992 Dependence on renal dialysis: Secondary | ICD-10-CM | POA: Diagnosis not present

## 2018-01-30 DIAGNOSIS — D631 Anemia in chronic kidney disease: Secondary | ICD-10-CM | POA: Diagnosis not present

## 2018-01-30 DIAGNOSIS — L89890 Pressure ulcer of other site, unstageable: Secondary | ICD-10-CM | POA: Diagnosis not present

## 2018-01-30 DIAGNOSIS — L89620 Pressure ulcer of left heel, unstageable: Secondary | ICD-10-CM | POA: Diagnosis not present

## 2018-01-30 DIAGNOSIS — L89610 Pressure ulcer of right heel, unstageable: Secondary | ICD-10-CM | POA: Diagnosis not present

## 2018-01-30 DIAGNOSIS — L89153 Pressure ulcer of sacral region, stage 3: Secondary | ICD-10-CM | POA: Diagnosis not present

## 2018-01-31 DIAGNOSIS — L89610 Pressure ulcer of right heel, unstageable: Secondary | ICD-10-CM | POA: Diagnosis not present

## 2018-01-31 DIAGNOSIS — E1122 Type 2 diabetes mellitus with diabetic chronic kidney disease: Secondary | ICD-10-CM | POA: Diagnosis not present

## 2018-01-31 DIAGNOSIS — L89890 Pressure ulcer of other site, unstageable: Secondary | ICD-10-CM | POA: Diagnosis not present

## 2018-01-31 DIAGNOSIS — L89620 Pressure ulcer of left heel, unstageable: Secondary | ICD-10-CM | POA: Diagnosis not present

## 2018-01-31 DIAGNOSIS — I120 Hypertensive chronic kidney disease with stage 5 chronic kidney disease or end stage renal disease: Secondary | ICD-10-CM | POA: Diagnosis not present

## 2018-01-31 DIAGNOSIS — L89153 Pressure ulcer of sacral region, stage 3: Secondary | ICD-10-CM | POA: Diagnosis not present

## 2018-02-01 DIAGNOSIS — D509 Iron deficiency anemia, unspecified: Secondary | ICD-10-CM | POA: Diagnosis not present

## 2018-02-01 DIAGNOSIS — L89610 Pressure ulcer of right heel, unstageable: Secondary | ICD-10-CM | POA: Diagnosis not present

## 2018-02-01 DIAGNOSIS — L89153 Pressure ulcer of sacral region, stage 3: Secondary | ICD-10-CM | POA: Diagnosis not present

## 2018-02-01 DIAGNOSIS — Z992 Dependence on renal dialysis: Secondary | ICD-10-CM | POA: Diagnosis not present

## 2018-02-01 DIAGNOSIS — L89620 Pressure ulcer of left heel, unstageable: Secondary | ICD-10-CM | POA: Diagnosis not present

## 2018-02-01 DIAGNOSIS — I120 Hypertensive chronic kidney disease with stage 5 chronic kidney disease or end stage renal disease: Secondary | ICD-10-CM | POA: Diagnosis not present

## 2018-02-01 DIAGNOSIS — N186 End stage renal disease: Secondary | ICD-10-CM | POA: Diagnosis not present

## 2018-02-01 DIAGNOSIS — E1122 Type 2 diabetes mellitus with diabetic chronic kidney disease: Secondary | ICD-10-CM | POA: Diagnosis not present

## 2018-02-01 DIAGNOSIS — L89890 Pressure ulcer of other site, unstageable: Secondary | ICD-10-CM | POA: Diagnosis not present

## 2018-02-01 DIAGNOSIS — D631 Anemia in chronic kidney disease: Secondary | ICD-10-CM | POA: Diagnosis not present

## 2018-02-02 ENCOUNTER — Telehealth: Payer: Self-pay | Admitting: Cardiology

## 2018-02-02 ENCOUNTER — Ambulatory Visit (INDEPENDENT_AMBULATORY_CARE_PROVIDER_SITE_OTHER): Payer: Medicare Other | Admitting: *Deleted

## 2018-02-02 DIAGNOSIS — Z5181 Encounter for therapeutic drug level monitoring: Secondary | ICD-10-CM | POA: Diagnosis not present

## 2018-02-02 DIAGNOSIS — L89890 Pressure ulcer of other site, unstageable: Secondary | ICD-10-CM | POA: Diagnosis not present

## 2018-02-02 DIAGNOSIS — N186 End stage renal disease: Secondary | ICD-10-CM | POA: Diagnosis not present

## 2018-02-02 DIAGNOSIS — Z7901 Long term (current) use of anticoagulants: Secondary | ICD-10-CM

## 2018-02-02 DIAGNOSIS — L89153 Pressure ulcer of sacral region, stage 3: Secondary | ICD-10-CM | POA: Diagnosis not present

## 2018-02-02 DIAGNOSIS — Z992 Dependence on renal dialysis: Secondary | ICD-10-CM | POA: Diagnosis not present

## 2018-02-02 DIAGNOSIS — L89620 Pressure ulcer of left heel, unstageable: Secondary | ICD-10-CM | POA: Diagnosis not present

## 2018-02-02 DIAGNOSIS — I482 Chronic atrial fibrillation, unspecified: Secondary | ICD-10-CM

## 2018-02-02 DIAGNOSIS — E1122 Type 2 diabetes mellitus with diabetic chronic kidney disease: Secondary | ICD-10-CM | POA: Diagnosis not present

## 2018-02-02 DIAGNOSIS — L89610 Pressure ulcer of right heel, unstageable: Secondary | ICD-10-CM | POA: Diagnosis not present

## 2018-02-02 DIAGNOSIS — I120 Hypertensive chronic kidney disease with stage 5 chronic kidney disease or end stage renal disease: Secondary | ICD-10-CM | POA: Diagnosis not present

## 2018-02-02 LAB — POCT INR: INR: 2.5

## 2018-02-02 NOTE — Telephone Encounter (Signed)
INR 2.5

## 2018-02-02 NOTE — Telephone Encounter (Signed)
Done.  Ordered called to Christie Beckers RN [facility].  See coumadin note.

## 2018-02-02 NOTE — Patient Instructions (Signed)
Continue coumadin 1 tablet daily Recheck in 1 week Order given to Tammy Bray RN [facility] 

## 2018-02-03 DIAGNOSIS — D509 Iron deficiency anemia, unspecified: Secondary | ICD-10-CM | POA: Diagnosis not present

## 2018-02-03 DIAGNOSIS — L89610 Pressure ulcer of right heel, unstageable: Secondary | ICD-10-CM | POA: Diagnosis not present

## 2018-02-03 DIAGNOSIS — I120 Hypertensive chronic kidney disease with stage 5 chronic kidney disease or end stage renal disease: Secondary | ICD-10-CM | POA: Diagnosis not present

## 2018-02-03 DIAGNOSIS — Z992 Dependence on renal dialysis: Secondary | ICD-10-CM | POA: Diagnosis not present

## 2018-02-03 DIAGNOSIS — L89890 Pressure ulcer of other site, unstageable: Secondary | ICD-10-CM | POA: Diagnosis not present

## 2018-02-03 DIAGNOSIS — N186 End stage renal disease: Secondary | ICD-10-CM | POA: Diagnosis not present

## 2018-02-03 DIAGNOSIS — L89620 Pressure ulcer of left heel, unstageable: Secondary | ICD-10-CM | POA: Diagnosis not present

## 2018-02-03 DIAGNOSIS — E1122 Type 2 diabetes mellitus with diabetic chronic kidney disease: Secondary | ICD-10-CM | POA: Diagnosis not present

## 2018-02-03 DIAGNOSIS — D631 Anemia in chronic kidney disease: Secondary | ICD-10-CM | POA: Diagnosis not present

## 2018-02-03 DIAGNOSIS — L89153 Pressure ulcer of sacral region, stage 3: Secondary | ICD-10-CM | POA: Diagnosis not present

## 2018-02-04 DIAGNOSIS — L89153 Pressure ulcer of sacral region, stage 3: Secondary | ICD-10-CM | POA: Diagnosis not present

## 2018-02-04 DIAGNOSIS — L89610 Pressure ulcer of right heel, unstageable: Secondary | ICD-10-CM | POA: Diagnosis not present

## 2018-02-04 DIAGNOSIS — E1122 Type 2 diabetes mellitus with diabetic chronic kidney disease: Secondary | ICD-10-CM | POA: Diagnosis not present

## 2018-02-04 DIAGNOSIS — L89620 Pressure ulcer of left heel, unstageable: Secondary | ICD-10-CM | POA: Diagnosis not present

## 2018-02-04 DIAGNOSIS — L89890 Pressure ulcer of other site, unstageable: Secondary | ICD-10-CM | POA: Diagnosis not present

## 2018-02-04 DIAGNOSIS — I120 Hypertensive chronic kidney disease with stage 5 chronic kidney disease or end stage renal disease: Secondary | ICD-10-CM | POA: Diagnosis not present

## 2018-02-06 DIAGNOSIS — L89610 Pressure ulcer of right heel, unstageable: Secondary | ICD-10-CM | POA: Diagnosis not present

## 2018-02-06 DIAGNOSIS — Z992 Dependence on renal dialysis: Secondary | ICD-10-CM | POA: Diagnosis not present

## 2018-02-06 DIAGNOSIS — L89890 Pressure ulcer of other site, unstageable: Secondary | ICD-10-CM | POA: Diagnosis not present

## 2018-02-06 DIAGNOSIS — D509 Iron deficiency anemia, unspecified: Secondary | ICD-10-CM | POA: Diagnosis not present

## 2018-02-06 DIAGNOSIS — D631 Anemia in chronic kidney disease: Secondary | ICD-10-CM | POA: Diagnosis not present

## 2018-02-06 DIAGNOSIS — N186 End stage renal disease: Secondary | ICD-10-CM | POA: Diagnosis not present

## 2018-02-06 DIAGNOSIS — L89620 Pressure ulcer of left heel, unstageable: Secondary | ICD-10-CM | POA: Diagnosis not present

## 2018-02-06 DIAGNOSIS — L89153 Pressure ulcer of sacral region, stage 3: Secondary | ICD-10-CM | POA: Diagnosis not present

## 2018-02-06 DIAGNOSIS — I120 Hypertensive chronic kidney disease with stage 5 chronic kidney disease or end stage renal disease: Secondary | ICD-10-CM | POA: Diagnosis not present

## 2018-02-06 DIAGNOSIS — E1122 Type 2 diabetes mellitus with diabetic chronic kidney disease: Secondary | ICD-10-CM | POA: Diagnosis not present

## 2018-02-07 DIAGNOSIS — I120 Hypertensive chronic kidney disease with stage 5 chronic kidney disease or end stage renal disease: Secondary | ICD-10-CM | POA: Diagnosis not present

## 2018-02-07 DIAGNOSIS — L89890 Pressure ulcer of other site, unstageable: Secondary | ICD-10-CM | POA: Diagnosis not present

## 2018-02-07 DIAGNOSIS — L89153 Pressure ulcer of sacral region, stage 3: Secondary | ICD-10-CM | POA: Diagnosis not present

## 2018-02-07 DIAGNOSIS — L89610 Pressure ulcer of right heel, unstageable: Secondary | ICD-10-CM | POA: Diagnosis not present

## 2018-02-07 DIAGNOSIS — E1122 Type 2 diabetes mellitus with diabetic chronic kidney disease: Secondary | ICD-10-CM | POA: Diagnosis not present

## 2018-02-07 DIAGNOSIS — L89620 Pressure ulcer of left heel, unstageable: Secondary | ICD-10-CM | POA: Diagnosis not present

## 2018-02-08 DIAGNOSIS — L89610 Pressure ulcer of right heel, unstageable: Secondary | ICD-10-CM | POA: Diagnosis not present

## 2018-02-08 DIAGNOSIS — Z992 Dependence on renal dialysis: Secondary | ICD-10-CM | POA: Diagnosis not present

## 2018-02-08 DIAGNOSIS — I120 Hypertensive chronic kidney disease with stage 5 chronic kidney disease or end stage renal disease: Secondary | ICD-10-CM | POA: Diagnosis not present

## 2018-02-08 DIAGNOSIS — L89890 Pressure ulcer of other site, unstageable: Secondary | ICD-10-CM | POA: Diagnosis not present

## 2018-02-08 DIAGNOSIS — N186 End stage renal disease: Secondary | ICD-10-CM | POA: Diagnosis not present

## 2018-02-08 DIAGNOSIS — L89153 Pressure ulcer of sacral region, stage 3: Secondary | ICD-10-CM | POA: Diagnosis not present

## 2018-02-08 DIAGNOSIS — D631 Anemia in chronic kidney disease: Secondary | ICD-10-CM | POA: Diagnosis not present

## 2018-02-08 DIAGNOSIS — E1122 Type 2 diabetes mellitus with diabetic chronic kidney disease: Secondary | ICD-10-CM | POA: Diagnosis not present

## 2018-02-08 DIAGNOSIS — L89620 Pressure ulcer of left heel, unstageable: Secondary | ICD-10-CM | POA: Diagnosis not present

## 2018-02-08 DIAGNOSIS — D509 Iron deficiency anemia, unspecified: Secondary | ICD-10-CM | POA: Diagnosis not present

## 2018-02-09 ENCOUNTER — Ambulatory Visit (INDEPENDENT_AMBULATORY_CARE_PROVIDER_SITE_OTHER): Payer: Medicare Other | Admitting: *Deleted

## 2018-02-09 DIAGNOSIS — I120 Hypertensive chronic kidney disease with stage 5 chronic kidney disease or end stage renal disease: Secondary | ICD-10-CM | POA: Diagnosis not present

## 2018-02-09 DIAGNOSIS — I482 Chronic atrial fibrillation, unspecified: Secondary | ICD-10-CM

## 2018-02-09 DIAGNOSIS — Z5181 Encounter for therapeutic drug level monitoring: Secondary | ICD-10-CM | POA: Diagnosis not present

## 2018-02-09 DIAGNOSIS — L89610 Pressure ulcer of right heel, unstageable: Secondary | ICD-10-CM | POA: Diagnosis not present

## 2018-02-09 DIAGNOSIS — E1122 Type 2 diabetes mellitus with diabetic chronic kidney disease: Secondary | ICD-10-CM | POA: Diagnosis not present

## 2018-02-09 DIAGNOSIS — L89890 Pressure ulcer of other site, unstageable: Secondary | ICD-10-CM | POA: Diagnosis not present

## 2018-02-09 DIAGNOSIS — L89153 Pressure ulcer of sacral region, stage 3: Secondary | ICD-10-CM | POA: Diagnosis not present

## 2018-02-09 DIAGNOSIS — L89620 Pressure ulcer of left heel, unstageable: Secondary | ICD-10-CM | POA: Diagnosis not present

## 2018-02-09 LAB — POCT INR: INR: 2.8

## 2018-02-09 NOTE — Patient Instructions (Signed)
Continue coumadin 1 tablet daily Recheck in 1 week Order given to Tammy Bray RN [facility] 

## 2018-02-10 DIAGNOSIS — L89620 Pressure ulcer of left heel, unstageable: Secondary | ICD-10-CM | POA: Diagnosis not present

## 2018-02-10 DIAGNOSIS — E1122 Type 2 diabetes mellitus with diabetic chronic kidney disease: Secondary | ICD-10-CM | POA: Diagnosis not present

## 2018-02-10 DIAGNOSIS — D631 Anemia in chronic kidney disease: Secondary | ICD-10-CM | POA: Diagnosis not present

## 2018-02-10 DIAGNOSIS — I120 Hypertensive chronic kidney disease with stage 5 chronic kidney disease or end stage renal disease: Secondary | ICD-10-CM | POA: Diagnosis not present

## 2018-02-10 DIAGNOSIS — N186 End stage renal disease: Secondary | ICD-10-CM | POA: Diagnosis not present

## 2018-02-10 DIAGNOSIS — L89153 Pressure ulcer of sacral region, stage 3: Secondary | ICD-10-CM | POA: Diagnosis not present

## 2018-02-10 DIAGNOSIS — Z992 Dependence on renal dialysis: Secondary | ICD-10-CM | POA: Diagnosis not present

## 2018-02-10 DIAGNOSIS — D509 Iron deficiency anemia, unspecified: Secondary | ICD-10-CM | POA: Diagnosis not present

## 2018-02-10 DIAGNOSIS — L89610 Pressure ulcer of right heel, unstageable: Secondary | ICD-10-CM | POA: Diagnosis not present

## 2018-02-10 DIAGNOSIS — L89890 Pressure ulcer of other site, unstageable: Secondary | ICD-10-CM | POA: Diagnosis not present

## 2018-02-13 DIAGNOSIS — N186 End stage renal disease: Secondary | ICD-10-CM | POA: Diagnosis not present

## 2018-02-13 DIAGNOSIS — D631 Anemia in chronic kidney disease: Secondary | ICD-10-CM | POA: Diagnosis not present

## 2018-02-13 DIAGNOSIS — L89890 Pressure ulcer of other site, unstageable: Secondary | ICD-10-CM | POA: Diagnosis not present

## 2018-02-13 DIAGNOSIS — L89153 Pressure ulcer of sacral region, stage 3: Secondary | ICD-10-CM | POA: Diagnosis not present

## 2018-02-13 DIAGNOSIS — Z992 Dependence on renal dialysis: Secondary | ICD-10-CM | POA: Diagnosis not present

## 2018-02-13 DIAGNOSIS — E1122 Type 2 diabetes mellitus with diabetic chronic kidney disease: Secondary | ICD-10-CM | POA: Diagnosis not present

## 2018-02-13 DIAGNOSIS — I120 Hypertensive chronic kidney disease with stage 5 chronic kidney disease or end stage renal disease: Secondary | ICD-10-CM | POA: Diagnosis not present

## 2018-02-13 DIAGNOSIS — L89620 Pressure ulcer of left heel, unstageable: Secondary | ICD-10-CM | POA: Diagnosis not present

## 2018-02-13 DIAGNOSIS — D509 Iron deficiency anemia, unspecified: Secondary | ICD-10-CM | POA: Diagnosis not present

## 2018-02-13 DIAGNOSIS — L89610 Pressure ulcer of right heel, unstageable: Secondary | ICD-10-CM | POA: Diagnosis not present

## 2018-02-14 DIAGNOSIS — E1122 Type 2 diabetes mellitus with diabetic chronic kidney disease: Secondary | ICD-10-CM | POA: Diagnosis not present

## 2018-02-14 DIAGNOSIS — I120 Hypertensive chronic kidney disease with stage 5 chronic kidney disease or end stage renal disease: Secondary | ICD-10-CM | POA: Diagnosis not present

## 2018-02-14 DIAGNOSIS — L89610 Pressure ulcer of right heel, unstageable: Secondary | ICD-10-CM | POA: Diagnosis not present

## 2018-02-14 DIAGNOSIS — L89153 Pressure ulcer of sacral region, stage 3: Secondary | ICD-10-CM | POA: Diagnosis not present

## 2018-02-14 DIAGNOSIS — L89620 Pressure ulcer of left heel, unstageable: Secondary | ICD-10-CM | POA: Diagnosis not present

## 2018-02-14 DIAGNOSIS — L89890 Pressure ulcer of other site, unstageable: Secondary | ICD-10-CM | POA: Diagnosis not present

## 2018-02-15 ENCOUNTER — Ambulatory Visit (INDEPENDENT_AMBULATORY_CARE_PROVIDER_SITE_OTHER): Payer: Medicare Other | Admitting: *Deleted

## 2018-02-15 DIAGNOSIS — N186 End stage renal disease: Secondary | ICD-10-CM | POA: Diagnosis not present

## 2018-02-15 DIAGNOSIS — I482 Chronic atrial fibrillation, unspecified: Secondary | ICD-10-CM

## 2018-02-15 DIAGNOSIS — Z5181 Encounter for therapeutic drug level monitoring: Secondary | ICD-10-CM

## 2018-02-15 DIAGNOSIS — L89153 Pressure ulcer of sacral region, stage 3: Secondary | ICD-10-CM | POA: Diagnosis not present

## 2018-02-15 DIAGNOSIS — D509 Iron deficiency anemia, unspecified: Secondary | ICD-10-CM | POA: Diagnosis not present

## 2018-02-15 DIAGNOSIS — Z7901 Long term (current) use of anticoagulants: Secondary | ICD-10-CM

## 2018-02-15 DIAGNOSIS — L89610 Pressure ulcer of right heel, unstageable: Secondary | ICD-10-CM | POA: Diagnosis not present

## 2018-02-15 DIAGNOSIS — Z992 Dependence on renal dialysis: Secondary | ICD-10-CM | POA: Diagnosis not present

## 2018-02-15 DIAGNOSIS — L89890 Pressure ulcer of other site, unstageable: Secondary | ICD-10-CM | POA: Diagnosis not present

## 2018-02-15 DIAGNOSIS — I120 Hypertensive chronic kidney disease with stage 5 chronic kidney disease or end stage renal disease: Secondary | ICD-10-CM | POA: Diagnosis not present

## 2018-02-15 DIAGNOSIS — L89620 Pressure ulcer of left heel, unstageable: Secondary | ICD-10-CM | POA: Diagnosis not present

## 2018-02-15 DIAGNOSIS — D631 Anemia in chronic kidney disease: Secondary | ICD-10-CM | POA: Diagnosis not present

## 2018-02-15 DIAGNOSIS — E1122 Type 2 diabetes mellitus with diabetic chronic kidney disease: Secondary | ICD-10-CM | POA: Diagnosis not present

## 2018-02-15 LAB — POCT INR: INR: 2.9

## 2018-02-15 NOTE — Patient Instructions (Signed)
Continue coumadin 1 tablet daily.  Recheck in 2 week  Order given to [employer] [facility]

## 2018-02-16 DIAGNOSIS — L89610 Pressure ulcer of right heel, unstageable: Secondary | ICD-10-CM | POA: Diagnosis not present

## 2018-02-16 DIAGNOSIS — L89890 Pressure ulcer of other site, unstageable: Secondary | ICD-10-CM | POA: Diagnosis not present

## 2018-02-16 DIAGNOSIS — L89153 Pressure ulcer of sacral region, stage 3: Secondary | ICD-10-CM | POA: Diagnosis not present

## 2018-02-16 DIAGNOSIS — I120 Hypertensive chronic kidney disease with stage 5 chronic kidney disease or end stage renal disease: Secondary | ICD-10-CM | POA: Diagnosis not present

## 2018-02-16 DIAGNOSIS — E1122 Type 2 diabetes mellitus with diabetic chronic kidney disease: Secondary | ICD-10-CM | POA: Diagnosis not present

## 2018-02-16 DIAGNOSIS — L89620 Pressure ulcer of left heel, unstageable: Secondary | ICD-10-CM | POA: Diagnosis not present

## 2018-02-17 DIAGNOSIS — N186 End stage renal disease: Secondary | ICD-10-CM | POA: Diagnosis not present

## 2018-02-17 DIAGNOSIS — L89890 Pressure ulcer of other site, unstageable: Secondary | ICD-10-CM | POA: Diagnosis not present

## 2018-02-17 DIAGNOSIS — E1122 Type 2 diabetes mellitus with diabetic chronic kidney disease: Secondary | ICD-10-CM | POA: Diagnosis not present

## 2018-02-17 DIAGNOSIS — D631 Anemia in chronic kidney disease: Secondary | ICD-10-CM | POA: Diagnosis not present

## 2018-02-17 DIAGNOSIS — D509 Iron deficiency anemia, unspecified: Secondary | ICD-10-CM | POA: Diagnosis not present

## 2018-02-17 DIAGNOSIS — I120 Hypertensive chronic kidney disease with stage 5 chronic kidney disease or end stage renal disease: Secondary | ICD-10-CM | POA: Diagnosis not present

## 2018-02-17 DIAGNOSIS — L89153 Pressure ulcer of sacral region, stage 3: Secondary | ICD-10-CM | POA: Diagnosis not present

## 2018-02-17 DIAGNOSIS — L89610 Pressure ulcer of right heel, unstageable: Secondary | ICD-10-CM | POA: Diagnosis not present

## 2018-02-17 DIAGNOSIS — Z992 Dependence on renal dialysis: Secondary | ICD-10-CM | POA: Diagnosis not present

## 2018-02-17 DIAGNOSIS — L89620 Pressure ulcer of left heel, unstageable: Secondary | ICD-10-CM | POA: Diagnosis not present

## 2018-02-18 DIAGNOSIS — L89153 Pressure ulcer of sacral region, stage 3: Secondary | ICD-10-CM | POA: Diagnosis not present

## 2018-02-20 DIAGNOSIS — Z992 Dependence on renal dialysis: Secondary | ICD-10-CM | POA: Diagnosis not present

## 2018-02-20 DIAGNOSIS — E1122 Type 2 diabetes mellitus with diabetic chronic kidney disease: Secondary | ICD-10-CM | POA: Diagnosis not present

## 2018-02-20 DIAGNOSIS — D509 Iron deficiency anemia, unspecified: Secondary | ICD-10-CM | POA: Diagnosis not present

## 2018-02-20 DIAGNOSIS — L89153 Pressure ulcer of sacral region, stage 3: Secondary | ICD-10-CM | POA: Diagnosis not present

## 2018-02-20 DIAGNOSIS — I2510 Atherosclerotic heart disease of native coronary artery without angina pectoris: Secondary | ICD-10-CM | POA: Diagnosis not present

## 2018-02-20 DIAGNOSIS — L89610 Pressure ulcer of right heel, unstageable: Secondary | ICD-10-CM | POA: Diagnosis not present

## 2018-02-20 DIAGNOSIS — D631 Anemia in chronic kidney disease: Secondary | ICD-10-CM | POA: Diagnosis not present

## 2018-02-20 DIAGNOSIS — L89620 Pressure ulcer of left heel, unstageable: Secondary | ICD-10-CM | POA: Diagnosis not present

## 2018-02-20 DIAGNOSIS — L89890 Pressure ulcer of other site, unstageable: Secondary | ICD-10-CM | POA: Diagnosis not present

## 2018-02-20 DIAGNOSIS — N186 End stage renal disease: Secondary | ICD-10-CM | POA: Diagnosis not present

## 2018-02-21 DIAGNOSIS — L89620 Pressure ulcer of left heel, unstageable: Secondary | ICD-10-CM | POA: Diagnosis not present

## 2018-02-21 DIAGNOSIS — E1122 Type 2 diabetes mellitus with diabetic chronic kidney disease: Secondary | ICD-10-CM | POA: Diagnosis not present

## 2018-02-21 DIAGNOSIS — L89153 Pressure ulcer of sacral region, stage 3: Secondary | ICD-10-CM | POA: Diagnosis not present

## 2018-02-21 DIAGNOSIS — I2510 Atherosclerotic heart disease of native coronary artery without angina pectoris: Secondary | ICD-10-CM | POA: Diagnosis not present

## 2018-02-21 DIAGNOSIS — L89890 Pressure ulcer of other site, unstageable: Secondary | ICD-10-CM | POA: Diagnosis not present

## 2018-02-21 DIAGNOSIS — L89610 Pressure ulcer of right heel, unstageable: Secondary | ICD-10-CM | POA: Diagnosis not present

## 2018-02-22 DIAGNOSIS — L89153 Pressure ulcer of sacral region, stage 3: Secondary | ICD-10-CM | POA: Diagnosis not present

## 2018-02-22 DIAGNOSIS — L89890 Pressure ulcer of other site, unstageable: Secondary | ICD-10-CM | POA: Diagnosis not present

## 2018-02-22 DIAGNOSIS — D509 Iron deficiency anemia, unspecified: Secondary | ICD-10-CM | POA: Diagnosis not present

## 2018-02-22 DIAGNOSIS — L89620 Pressure ulcer of left heel, unstageable: Secondary | ICD-10-CM | POA: Diagnosis not present

## 2018-02-22 DIAGNOSIS — Z992 Dependence on renal dialysis: Secondary | ICD-10-CM | POA: Diagnosis not present

## 2018-02-22 DIAGNOSIS — E1122 Type 2 diabetes mellitus with diabetic chronic kidney disease: Secondary | ICD-10-CM | POA: Diagnosis not present

## 2018-02-22 DIAGNOSIS — N186 End stage renal disease: Secondary | ICD-10-CM | POA: Diagnosis not present

## 2018-02-22 DIAGNOSIS — I2510 Atherosclerotic heart disease of native coronary artery without angina pectoris: Secondary | ICD-10-CM | POA: Diagnosis not present

## 2018-02-22 DIAGNOSIS — D631 Anemia in chronic kidney disease: Secondary | ICD-10-CM | POA: Diagnosis not present

## 2018-02-22 DIAGNOSIS — L89610 Pressure ulcer of right heel, unstageable: Secondary | ICD-10-CM | POA: Diagnosis not present

## 2018-02-23 DIAGNOSIS — L89620 Pressure ulcer of left heel, unstageable: Secondary | ICD-10-CM | POA: Diagnosis not present

## 2018-02-23 DIAGNOSIS — L89610 Pressure ulcer of right heel, unstageable: Secondary | ICD-10-CM | POA: Diagnosis not present

## 2018-02-23 DIAGNOSIS — L89153 Pressure ulcer of sacral region, stage 3: Secondary | ICD-10-CM | POA: Diagnosis not present

## 2018-02-23 DIAGNOSIS — I2510 Atherosclerotic heart disease of native coronary artery without angina pectoris: Secondary | ICD-10-CM | POA: Diagnosis not present

## 2018-02-23 DIAGNOSIS — E1122 Type 2 diabetes mellitus with diabetic chronic kidney disease: Secondary | ICD-10-CM | POA: Diagnosis not present

## 2018-02-23 DIAGNOSIS — L89890 Pressure ulcer of other site, unstageable: Secondary | ICD-10-CM | POA: Diagnosis not present

## 2018-02-24 DIAGNOSIS — E1122 Type 2 diabetes mellitus with diabetic chronic kidney disease: Secondary | ICD-10-CM | POA: Diagnosis not present

## 2018-02-24 DIAGNOSIS — N186 End stage renal disease: Secondary | ICD-10-CM | POA: Diagnosis not present

## 2018-02-24 DIAGNOSIS — Z992 Dependence on renal dialysis: Secondary | ICD-10-CM | POA: Diagnosis not present

## 2018-02-24 DIAGNOSIS — D631 Anemia in chronic kidney disease: Secondary | ICD-10-CM | POA: Diagnosis not present

## 2018-02-24 DIAGNOSIS — L89153 Pressure ulcer of sacral region, stage 3: Secondary | ICD-10-CM | POA: Diagnosis not present

## 2018-02-24 DIAGNOSIS — L89610 Pressure ulcer of right heel, unstageable: Secondary | ICD-10-CM | POA: Diagnosis not present

## 2018-02-24 DIAGNOSIS — D509 Iron deficiency anemia, unspecified: Secondary | ICD-10-CM | POA: Diagnosis not present

## 2018-02-24 DIAGNOSIS — L89620 Pressure ulcer of left heel, unstageable: Secondary | ICD-10-CM | POA: Diagnosis not present

## 2018-02-24 DIAGNOSIS — L89890 Pressure ulcer of other site, unstageable: Secondary | ICD-10-CM | POA: Diagnosis not present

## 2018-02-24 DIAGNOSIS — I2510 Atherosclerotic heart disease of native coronary artery without angina pectoris: Secondary | ICD-10-CM | POA: Diagnosis not present

## 2018-02-27 ENCOUNTER — Ambulatory Visit (INDEPENDENT_AMBULATORY_CARE_PROVIDER_SITE_OTHER): Payer: Medicare Other | Admitting: Cardiology

## 2018-02-27 DIAGNOSIS — Z7901 Long term (current) use of anticoagulants: Secondary | ICD-10-CM

## 2018-02-27 DIAGNOSIS — L89610 Pressure ulcer of right heel, unstageable: Secondary | ICD-10-CM | POA: Diagnosis not present

## 2018-02-27 DIAGNOSIS — E1122 Type 2 diabetes mellitus with diabetic chronic kidney disease: Secondary | ICD-10-CM | POA: Diagnosis not present

## 2018-02-27 DIAGNOSIS — N186 End stage renal disease: Secondary | ICD-10-CM | POA: Diagnosis not present

## 2018-02-27 DIAGNOSIS — Z992 Dependence on renal dialysis: Secondary | ICD-10-CM | POA: Diagnosis not present

## 2018-02-27 DIAGNOSIS — L89890 Pressure ulcer of other site, unstageable: Secondary | ICD-10-CM | POA: Diagnosis not present

## 2018-02-27 DIAGNOSIS — I2510 Atherosclerotic heart disease of native coronary artery without angina pectoris: Secondary | ICD-10-CM | POA: Diagnosis not present

## 2018-02-27 DIAGNOSIS — D631 Anemia in chronic kidney disease: Secondary | ICD-10-CM | POA: Diagnosis not present

## 2018-02-27 DIAGNOSIS — I482 Chronic atrial fibrillation, unspecified: Secondary | ICD-10-CM

## 2018-02-27 DIAGNOSIS — D509 Iron deficiency anemia, unspecified: Secondary | ICD-10-CM | POA: Diagnosis not present

## 2018-02-27 DIAGNOSIS — L89153 Pressure ulcer of sacral region, stage 3: Secondary | ICD-10-CM | POA: Diagnosis not present

## 2018-02-27 DIAGNOSIS — L89620 Pressure ulcer of left heel, unstageable: Secondary | ICD-10-CM | POA: Diagnosis not present

## 2018-02-27 LAB — POCT INR: INR: 2.4

## 2018-02-27 NOTE — Patient Instructions (Signed)
Description   Continue coumadin 1 tablet daily.  Recheck in 4 week  Order given to [place]

## 2018-02-28 DIAGNOSIS — L89620 Pressure ulcer of left heel, unstageable: Secondary | ICD-10-CM | POA: Diagnosis not present

## 2018-02-28 DIAGNOSIS — I2510 Atherosclerotic heart disease of native coronary artery without angina pectoris: Secondary | ICD-10-CM | POA: Diagnosis not present

## 2018-02-28 DIAGNOSIS — L89890 Pressure ulcer of other site, unstageable: Secondary | ICD-10-CM | POA: Diagnosis not present

## 2018-02-28 DIAGNOSIS — L89610 Pressure ulcer of right heel, unstageable: Secondary | ICD-10-CM | POA: Diagnosis not present

## 2018-02-28 DIAGNOSIS — E1122 Type 2 diabetes mellitus with diabetic chronic kidney disease: Secondary | ICD-10-CM | POA: Diagnosis not present

## 2018-02-28 DIAGNOSIS — L89153 Pressure ulcer of sacral region, stage 3: Secondary | ICD-10-CM | POA: Diagnosis not present

## 2018-03-01 DIAGNOSIS — D631 Anemia in chronic kidney disease: Secondary | ICD-10-CM | POA: Diagnosis not present

## 2018-03-01 DIAGNOSIS — N186 End stage renal disease: Secondary | ICD-10-CM | POA: Diagnosis not present

## 2018-03-01 DIAGNOSIS — D509 Iron deficiency anemia, unspecified: Secondary | ICD-10-CM | POA: Diagnosis not present

## 2018-03-01 DIAGNOSIS — L89153 Pressure ulcer of sacral region, stage 3: Secondary | ICD-10-CM | POA: Diagnosis not present

## 2018-03-01 DIAGNOSIS — L89890 Pressure ulcer of other site, unstageable: Secondary | ICD-10-CM | POA: Diagnosis not present

## 2018-03-01 DIAGNOSIS — E1122 Type 2 diabetes mellitus with diabetic chronic kidney disease: Secondary | ICD-10-CM | POA: Diagnosis not present

## 2018-03-01 DIAGNOSIS — I2510 Atherosclerotic heart disease of native coronary artery without angina pectoris: Secondary | ICD-10-CM | POA: Diagnosis not present

## 2018-03-01 DIAGNOSIS — L89610 Pressure ulcer of right heel, unstageable: Secondary | ICD-10-CM | POA: Diagnosis not present

## 2018-03-01 DIAGNOSIS — Z992 Dependence on renal dialysis: Secondary | ICD-10-CM | POA: Diagnosis not present

## 2018-03-01 DIAGNOSIS — L89620 Pressure ulcer of left heel, unstageable: Secondary | ICD-10-CM | POA: Diagnosis not present

## 2018-03-02 DIAGNOSIS — L89890 Pressure ulcer of other site, unstageable: Secondary | ICD-10-CM | POA: Diagnosis not present

## 2018-03-02 DIAGNOSIS — L89620 Pressure ulcer of left heel, unstageable: Secondary | ICD-10-CM | POA: Diagnosis not present

## 2018-03-02 DIAGNOSIS — I2510 Atherosclerotic heart disease of native coronary artery without angina pectoris: Secondary | ICD-10-CM | POA: Diagnosis not present

## 2018-03-02 DIAGNOSIS — E1122 Type 2 diabetes mellitus with diabetic chronic kidney disease: Secondary | ICD-10-CM | POA: Diagnosis not present

## 2018-03-02 DIAGNOSIS — L89610 Pressure ulcer of right heel, unstageable: Secondary | ICD-10-CM | POA: Diagnosis not present

## 2018-03-02 DIAGNOSIS — L89153 Pressure ulcer of sacral region, stage 3: Secondary | ICD-10-CM | POA: Diagnosis not present

## 2018-03-03 DIAGNOSIS — E1122 Type 2 diabetes mellitus with diabetic chronic kidney disease: Secondary | ICD-10-CM | POA: Diagnosis not present

## 2018-03-03 DIAGNOSIS — Z992 Dependence on renal dialysis: Secondary | ICD-10-CM | POA: Diagnosis not present

## 2018-03-03 DIAGNOSIS — L89610 Pressure ulcer of right heel, unstageable: Secondary | ICD-10-CM | POA: Diagnosis not present

## 2018-03-03 DIAGNOSIS — N186 End stage renal disease: Secondary | ICD-10-CM | POA: Diagnosis not present

## 2018-03-03 DIAGNOSIS — L89153 Pressure ulcer of sacral region, stage 3: Secondary | ICD-10-CM | POA: Diagnosis not present

## 2018-03-03 DIAGNOSIS — D509 Iron deficiency anemia, unspecified: Secondary | ICD-10-CM | POA: Diagnosis not present

## 2018-03-03 DIAGNOSIS — I2510 Atherosclerotic heart disease of native coronary artery without angina pectoris: Secondary | ICD-10-CM | POA: Diagnosis not present

## 2018-03-03 DIAGNOSIS — L89890 Pressure ulcer of other site, unstageable: Secondary | ICD-10-CM | POA: Diagnosis not present

## 2018-03-03 DIAGNOSIS — L89620 Pressure ulcer of left heel, unstageable: Secondary | ICD-10-CM | POA: Diagnosis not present

## 2018-03-03 DIAGNOSIS — D631 Anemia in chronic kidney disease: Secondary | ICD-10-CM | POA: Diagnosis not present

## 2018-03-05 DIAGNOSIS — N186 End stage renal disease: Secondary | ICD-10-CM | POA: Diagnosis not present

## 2018-03-05 DIAGNOSIS — Z992 Dependence on renal dialysis: Secondary | ICD-10-CM | POA: Diagnosis not present

## 2018-03-06 DIAGNOSIS — L89890 Pressure ulcer of other site, unstageable: Secondary | ICD-10-CM | POA: Diagnosis not present

## 2018-03-06 DIAGNOSIS — L299 Pruritus, unspecified: Secondary | ICD-10-CM | POA: Diagnosis not present

## 2018-03-06 DIAGNOSIS — E1122 Type 2 diabetes mellitus with diabetic chronic kidney disease: Secondary | ICD-10-CM | POA: Diagnosis not present

## 2018-03-06 DIAGNOSIS — D631 Anemia in chronic kidney disease: Secondary | ICD-10-CM | POA: Diagnosis not present

## 2018-03-06 DIAGNOSIS — D509 Iron deficiency anemia, unspecified: Secondary | ICD-10-CM | POA: Diagnosis not present

## 2018-03-06 DIAGNOSIS — L89153 Pressure ulcer of sacral region, stage 3: Secondary | ICD-10-CM | POA: Diagnosis not present

## 2018-03-06 DIAGNOSIS — N186 End stage renal disease: Secondary | ICD-10-CM | POA: Diagnosis not present

## 2018-03-06 DIAGNOSIS — N2581 Secondary hyperparathyroidism of renal origin: Secondary | ICD-10-CM | POA: Diagnosis not present

## 2018-03-06 DIAGNOSIS — Z992 Dependence on renal dialysis: Secondary | ICD-10-CM | POA: Diagnosis not present

## 2018-03-06 DIAGNOSIS — L89620 Pressure ulcer of left heel, unstageable: Secondary | ICD-10-CM | POA: Diagnosis not present

## 2018-03-06 DIAGNOSIS — L89610 Pressure ulcer of right heel, unstageable: Secondary | ICD-10-CM | POA: Diagnosis not present

## 2018-03-06 DIAGNOSIS — I2510 Atherosclerotic heart disease of native coronary artery without angina pectoris: Secondary | ICD-10-CM | POA: Diagnosis not present

## 2018-03-07 ENCOUNTER — Ambulatory Visit (INDEPENDENT_AMBULATORY_CARE_PROVIDER_SITE_OTHER): Payer: Medicare Other | Admitting: *Deleted

## 2018-03-07 DIAGNOSIS — I2510 Atherosclerotic heart disease of native coronary artery without angina pectoris: Secondary | ICD-10-CM | POA: Diagnosis not present

## 2018-03-07 DIAGNOSIS — E1122 Type 2 diabetes mellitus with diabetic chronic kidney disease: Secondary | ICD-10-CM | POA: Diagnosis not present

## 2018-03-07 DIAGNOSIS — L89153 Pressure ulcer of sacral region, stage 3: Secondary | ICD-10-CM | POA: Diagnosis not present

## 2018-03-07 DIAGNOSIS — I255 Ischemic cardiomyopathy: Secondary | ICD-10-CM | POA: Diagnosis not present

## 2018-03-07 DIAGNOSIS — L89620 Pressure ulcer of left heel, unstageable: Secondary | ICD-10-CM | POA: Diagnosis not present

## 2018-03-07 DIAGNOSIS — L89610 Pressure ulcer of right heel, unstageable: Secondary | ICD-10-CM | POA: Diagnosis not present

## 2018-03-07 DIAGNOSIS — I5022 Chronic systolic (congestive) heart failure: Secondary | ICD-10-CM

## 2018-03-07 DIAGNOSIS — L89890 Pressure ulcer of other site, unstageable: Secondary | ICD-10-CM | POA: Diagnosis not present

## 2018-03-07 NOTE — Progress Notes (Signed)
Remote ICD transmission.   

## 2018-03-08 ENCOUNTER — Encounter: Payer: Self-pay | Admitting: Cardiology

## 2018-03-08 DIAGNOSIS — L89610 Pressure ulcer of right heel, unstageable: Secondary | ICD-10-CM | POA: Diagnosis not present

## 2018-03-08 DIAGNOSIS — L299 Pruritus, unspecified: Secondary | ICD-10-CM | POA: Diagnosis not present

## 2018-03-08 DIAGNOSIS — L89153 Pressure ulcer of sacral region, stage 3: Secondary | ICD-10-CM | POA: Diagnosis not present

## 2018-03-08 DIAGNOSIS — D631 Anemia in chronic kidney disease: Secondary | ICD-10-CM | POA: Diagnosis not present

## 2018-03-08 DIAGNOSIS — N186 End stage renal disease: Secondary | ICD-10-CM | POA: Diagnosis not present

## 2018-03-08 DIAGNOSIS — N2581 Secondary hyperparathyroidism of renal origin: Secondary | ICD-10-CM | POA: Diagnosis not present

## 2018-03-08 DIAGNOSIS — E1122 Type 2 diabetes mellitus with diabetic chronic kidney disease: Secondary | ICD-10-CM | POA: Diagnosis not present

## 2018-03-08 DIAGNOSIS — L89620 Pressure ulcer of left heel, unstageable: Secondary | ICD-10-CM | POA: Diagnosis not present

## 2018-03-08 DIAGNOSIS — I2510 Atherosclerotic heart disease of native coronary artery without angina pectoris: Secondary | ICD-10-CM | POA: Diagnosis not present

## 2018-03-08 DIAGNOSIS — Z992 Dependence on renal dialysis: Secondary | ICD-10-CM | POA: Diagnosis not present

## 2018-03-08 DIAGNOSIS — L89890 Pressure ulcer of other site, unstageable: Secondary | ICD-10-CM | POA: Diagnosis not present

## 2018-03-08 DIAGNOSIS — D509 Iron deficiency anemia, unspecified: Secondary | ICD-10-CM | POA: Diagnosis not present

## 2018-03-09 DIAGNOSIS — I2510 Atherosclerotic heart disease of native coronary artery without angina pectoris: Secondary | ICD-10-CM | POA: Diagnosis not present

## 2018-03-09 DIAGNOSIS — L89153 Pressure ulcer of sacral region, stage 3: Secondary | ICD-10-CM | POA: Diagnosis not present

## 2018-03-09 DIAGNOSIS — L89610 Pressure ulcer of right heel, unstageable: Secondary | ICD-10-CM | POA: Diagnosis not present

## 2018-03-09 DIAGNOSIS — L89890 Pressure ulcer of other site, unstageable: Secondary | ICD-10-CM | POA: Diagnosis not present

## 2018-03-09 DIAGNOSIS — L89620 Pressure ulcer of left heel, unstageable: Secondary | ICD-10-CM | POA: Diagnosis not present

## 2018-03-09 DIAGNOSIS — E1122 Type 2 diabetes mellitus with diabetic chronic kidney disease: Secondary | ICD-10-CM | POA: Diagnosis not present

## 2018-03-10 DIAGNOSIS — L89620 Pressure ulcer of left heel, unstageable: Secondary | ICD-10-CM | POA: Diagnosis not present

## 2018-03-10 DIAGNOSIS — L89153 Pressure ulcer of sacral region, stage 3: Secondary | ICD-10-CM | POA: Diagnosis not present

## 2018-03-10 DIAGNOSIS — Z992 Dependence on renal dialysis: Secondary | ICD-10-CM | POA: Diagnosis not present

## 2018-03-10 DIAGNOSIS — E1122 Type 2 diabetes mellitus with diabetic chronic kidney disease: Secondary | ICD-10-CM | POA: Diagnosis not present

## 2018-03-10 DIAGNOSIS — L299 Pruritus, unspecified: Secondary | ICD-10-CM | POA: Diagnosis not present

## 2018-03-10 DIAGNOSIS — I2510 Atherosclerotic heart disease of native coronary artery without angina pectoris: Secondary | ICD-10-CM | POA: Diagnosis not present

## 2018-03-10 DIAGNOSIS — D509 Iron deficiency anemia, unspecified: Secondary | ICD-10-CM | POA: Diagnosis not present

## 2018-03-10 DIAGNOSIS — L89890 Pressure ulcer of other site, unstageable: Secondary | ICD-10-CM | POA: Diagnosis not present

## 2018-03-10 DIAGNOSIS — N186 End stage renal disease: Secondary | ICD-10-CM | POA: Diagnosis not present

## 2018-03-10 DIAGNOSIS — D631 Anemia in chronic kidney disease: Secondary | ICD-10-CM | POA: Diagnosis not present

## 2018-03-10 DIAGNOSIS — N2581 Secondary hyperparathyroidism of renal origin: Secondary | ICD-10-CM | POA: Diagnosis not present

## 2018-03-10 DIAGNOSIS — L89610 Pressure ulcer of right heel, unstageable: Secondary | ICD-10-CM | POA: Diagnosis not present

## 2018-03-13 DIAGNOSIS — D509 Iron deficiency anemia, unspecified: Secondary | ICD-10-CM | POA: Diagnosis not present

## 2018-03-13 DIAGNOSIS — N2581 Secondary hyperparathyroidism of renal origin: Secondary | ICD-10-CM | POA: Diagnosis not present

## 2018-03-13 DIAGNOSIS — L299 Pruritus, unspecified: Secondary | ICD-10-CM | POA: Diagnosis not present

## 2018-03-13 DIAGNOSIS — E1122 Type 2 diabetes mellitus with diabetic chronic kidney disease: Secondary | ICD-10-CM | POA: Diagnosis not present

## 2018-03-13 DIAGNOSIS — L89890 Pressure ulcer of other site, unstageable: Secondary | ICD-10-CM | POA: Diagnosis not present

## 2018-03-13 DIAGNOSIS — I2510 Atherosclerotic heart disease of native coronary artery without angina pectoris: Secondary | ICD-10-CM | POA: Diagnosis not present

## 2018-03-13 DIAGNOSIS — N186 End stage renal disease: Secondary | ICD-10-CM | POA: Diagnosis not present

## 2018-03-13 DIAGNOSIS — D631 Anemia in chronic kidney disease: Secondary | ICD-10-CM | POA: Diagnosis not present

## 2018-03-13 DIAGNOSIS — L89620 Pressure ulcer of left heel, unstageable: Secondary | ICD-10-CM | POA: Diagnosis not present

## 2018-03-13 DIAGNOSIS — L89610 Pressure ulcer of right heel, unstageable: Secondary | ICD-10-CM | POA: Diagnosis not present

## 2018-03-13 DIAGNOSIS — Z992 Dependence on renal dialysis: Secondary | ICD-10-CM | POA: Diagnosis not present

## 2018-03-13 DIAGNOSIS — L89153 Pressure ulcer of sacral region, stage 3: Secondary | ICD-10-CM | POA: Diagnosis not present

## 2018-03-14 DIAGNOSIS — L89890 Pressure ulcer of other site, unstageable: Secondary | ICD-10-CM | POA: Diagnosis not present

## 2018-03-14 DIAGNOSIS — L89153 Pressure ulcer of sacral region, stage 3: Secondary | ICD-10-CM | POA: Diagnosis not present

## 2018-03-14 DIAGNOSIS — L89620 Pressure ulcer of left heel, unstageable: Secondary | ICD-10-CM | POA: Diagnosis not present

## 2018-03-14 DIAGNOSIS — I2510 Atherosclerotic heart disease of native coronary artery without angina pectoris: Secondary | ICD-10-CM | POA: Diagnosis not present

## 2018-03-14 DIAGNOSIS — L89610 Pressure ulcer of right heel, unstageable: Secondary | ICD-10-CM | POA: Diagnosis not present

## 2018-03-14 DIAGNOSIS — E1122 Type 2 diabetes mellitus with diabetic chronic kidney disease: Secondary | ICD-10-CM | POA: Diagnosis not present

## 2018-03-15 DIAGNOSIS — L299 Pruritus, unspecified: Secondary | ICD-10-CM | POA: Diagnosis not present

## 2018-03-15 DIAGNOSIS — Z992 Dependence on renal dialysis: Secondary | ICD-10-CM | POA: Diagnosis not present

## 2018-03-15 DIAGNOSIS — L89890 Pressure ulcer of other site, unstageable: Secondary | ICD-10-CM | POA: Diagnosis not present

## 2018-03-15 DIAGNOSIS — N186 End stage renal disease: Secondary | ICD-10-CM | POA: Diagnosis not present

## 2018-03-15 DIAGNOSIS — D509 Iron deficiency anemia, unspecified: Secondary | ICD-10-CM | POA: Diagnosis not present

## 2018-03-15 DIAGNOSIS — E1122 Type 2 diabetes mellitus with diabetic chronic kidney disease: Secondary | ICD-10-CM | POA: Diagnosis not present

## 2018-03-15 DIAGNOSIS — L89153 Pressure ulcer of sacral region, stage 3: Secondary | ICD-10-CM | POA: Diagnosis not present

## 2018-03-15 DIAGNOSIS — L89610 Pressure ulcer of right heel, unstageable: Secondary | ICD-10-CM | POA: Diagnosis not present

## 2018-03-15 DIAGNOSIS — D631 Anemia in chronic kidney disease: Secondary | ICD-10-CM | POA: Diagnosis not present

## 2018-03-15 DIAGNOSIS — N2581 Secondary hyperparathyroidism of renal origin: Secondary | ICD-10-CM | POA: Diagnosis not present

## 2018-03-15 DIAGNOSIS — L89620 Pressure ulcer of left heel, unstageable: Secondary | ICD-10-CM | POA: Diagnosis not present

## 2018-03-15 DIAGNOSIS — I2510 Atherosclerotic heart disease of native coronary artery without angina pectoris: Secondary | ICD-10-CM | POA: Diagnosis not present

## 2018-03-16 DIAGNOSIS — I2510 Atherosclerotic heart disease of native coronary artery without angina pectoris: Secondary | ICD-10-CM | POA: Diagnosis not present

## 2018-03-16 DIAGNOSIS — E1122 Type 2 diabetes mellitus with diabetic chronic kidney disease: Secondary | ICD-10-CM | POA: Diagnosis not present

## 2018-03-16 DIAGNOSIS — L89620 Pressure ulcer of left heel, unstageable: Secondary | ICD-10-CM | POA: Diagnosis not present

## 2018-03-16 DIAGNOSIS — L89153 Pressure ulcer of sacral region, stage 3: Secondary | ICD-10-CM | POA: Diagnosis not present

## 2018-03-16 DIAGNOSIS — L89610 Pressure ulcer of right heel, unstageable: Secondary | ICD-10-CM | POA: Diagnosis not present

## 2018-03-16 DIAGNOSIS — L89890 Pressure ulcer of other site, unstageable: Secondary | ICD-10-CM | POA: Diagnosis not present

## 2018-03-17 DIAGNOSIS — N2581 Secondary hyperparathyroidism of renal origin: Secondary | ICD-10-CM | POA: Diagnosis not present

## 2018-03-17 DIAGNOSIS — L89890 Pressure ulcer of other site, unstageable: Secondary | ICD-10-CM | POA: Diagnosis not present

## 2018-03-17 DIAGNOSIS — Z992 Dependence on renal dialysis: Secondary | ICD-10-CM | POA: Diagnosis not present

## 2018-03-17 DIAGNOSIS — E1122 Type 2 diabetes mellitus with diabetic chronic kidney disease: Secondary | ICD-10-CM | POA: Diagnosis not present

## 2018-03-17 DIAGNOSIS — D509 Iron deficiency anemia, unspecified: Secondary | ICD-10-CM | POA: Diagnosis not present

## 2018-03-17 DIAGNOSIS — L89610 Pressure ulcer of right heel, unstageable: Secondary | ICD-10-CM | POA: Diagnosis not present

## 2018-03-17 DIAGNOSIS — L299 Pruritus, unspecified: Secondary | ICD-10-CM | POA: Diagnosis not present

## 2018-03-17 DIAGNOSIS — D631 Anemia in chronic kidney disease: Secondary | ICD-10-CM | POA: Diagnosis not present

## 2018-03-17 DIAGNOSIS — N186 End stage renal disease: Secondary | ICD-10-CM | POA: Diagnosis not present

## 2018-03-17 DIAGNOSIS — L89620 Pressure ulcer of left heel, unstageable: Secondary | ICD-10-CM | POA: Diagnosis not present

## 2018-03-17 DIAGNOSIS — L89153 Pressure ulcer of sacral region, stage 3: Secondary | ICD-10-CM | POA: Diagnosis not present

## 2018-03-17 DIAGNOSIS — I2510 Atherosclerotic heart disease of native coronary artery without angina pectoris: Secondary | ICD-10-CM | POA: Diagnosis not present

## 2018-03-17 IMAGING — US US ABDOMEN COMPLETE
1 series · 14 of 25 positions shown · non-contrast
Comparison: Abdominal CT 05/05/2016

CLINICAL DATA: Upper abdominal pain.  Elevated LFTs.

EXAM:
ABDOMEN ULTRASOUND COMPLETE

[Series 1: us abdomen complete · 0.23mm/px · 14 of 91 slices shown]
[im 1/91]
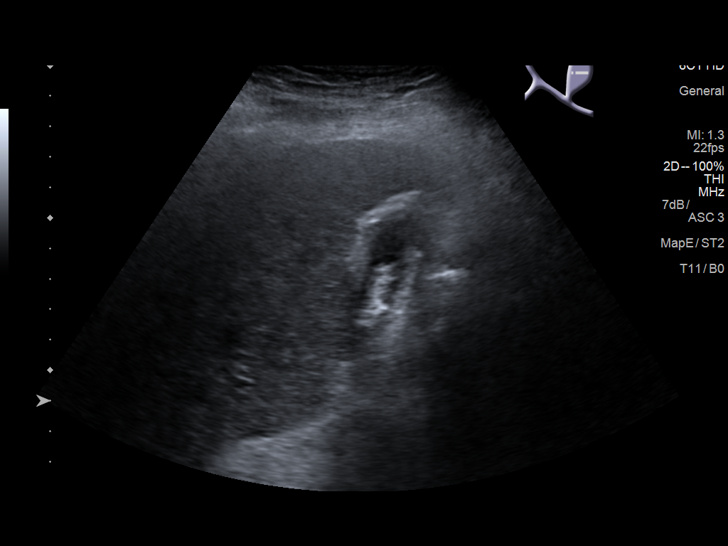
[im 8/91]
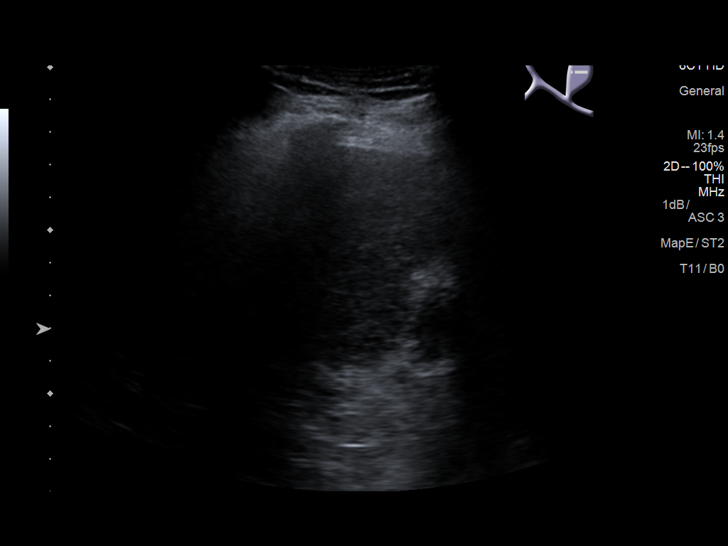
[im 16/91]
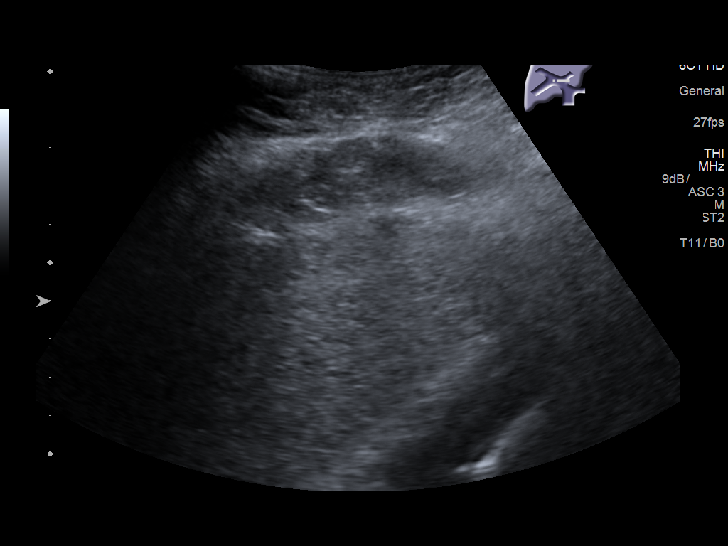
[im 23/91]
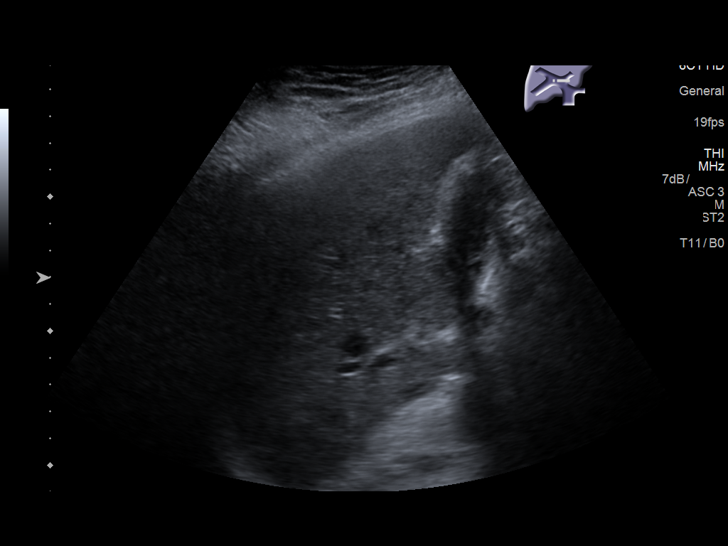
[im 31/91]
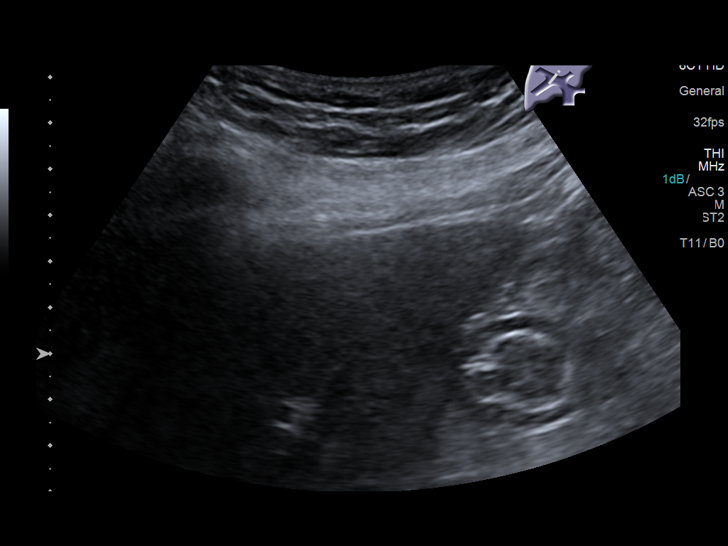
[im 34/91]
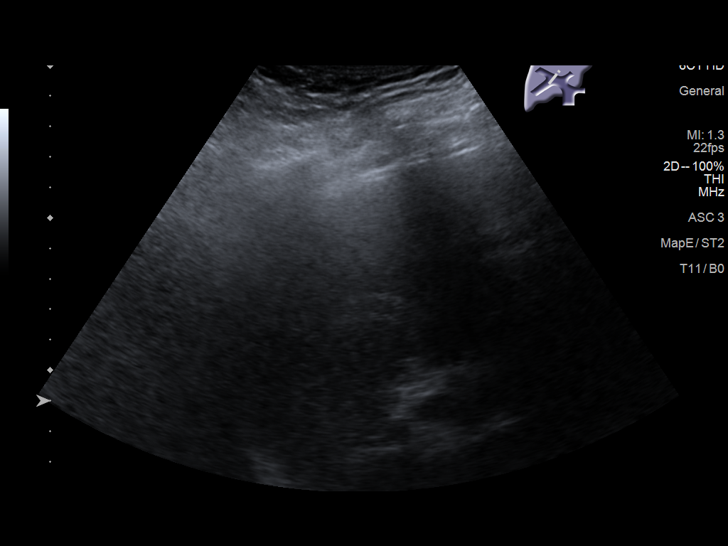
[im 42/91]
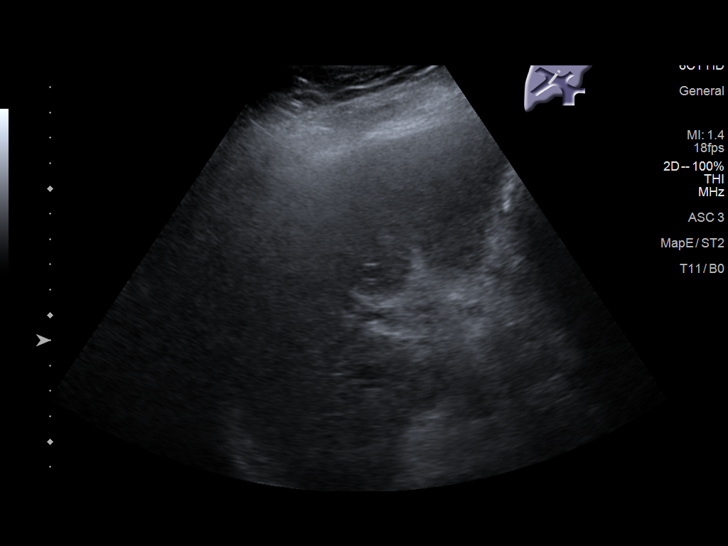
[im 49/91]
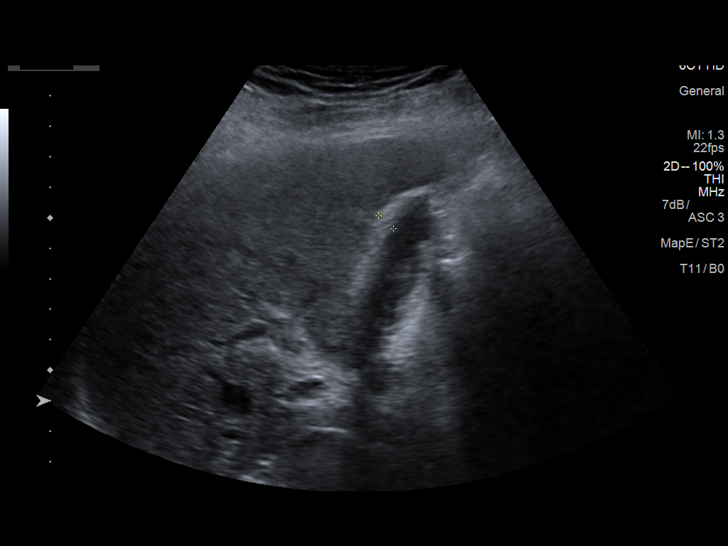
[im 57/91]
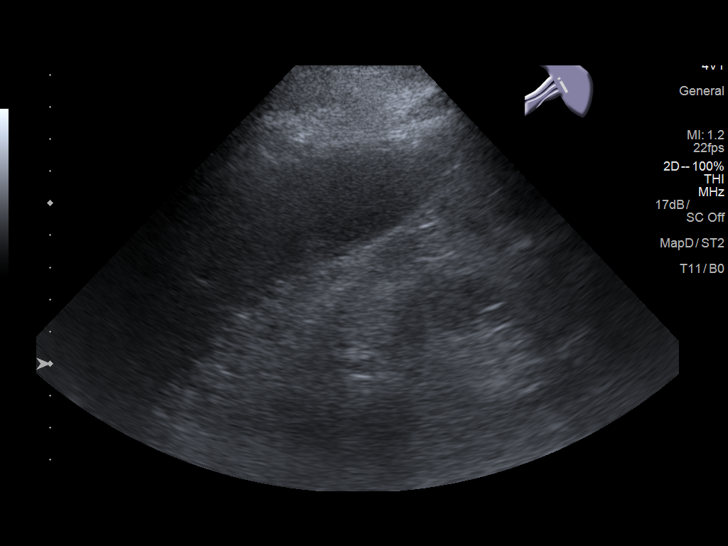
[im 61/91]
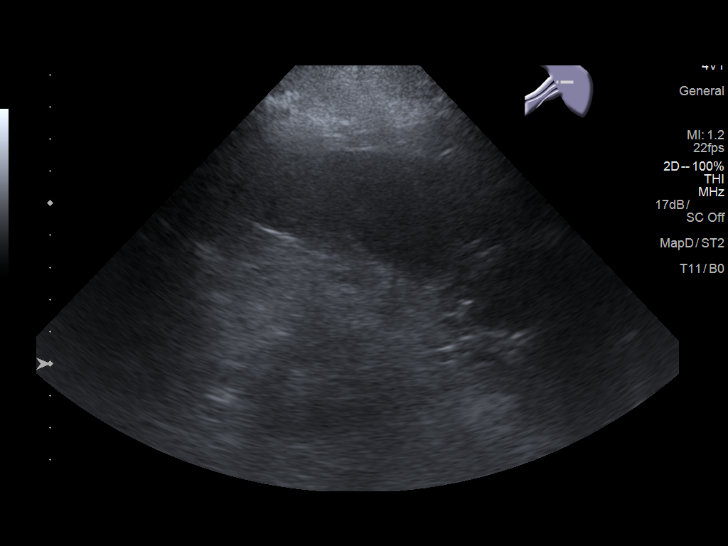
[im 68/91]
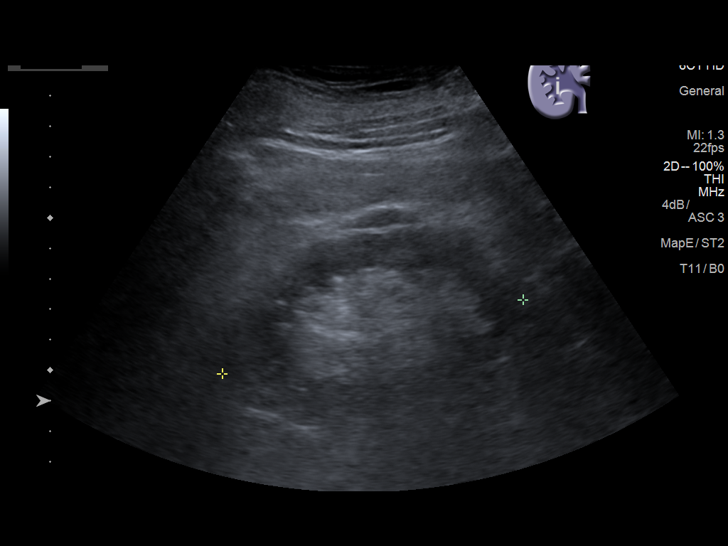
[im 76/91]
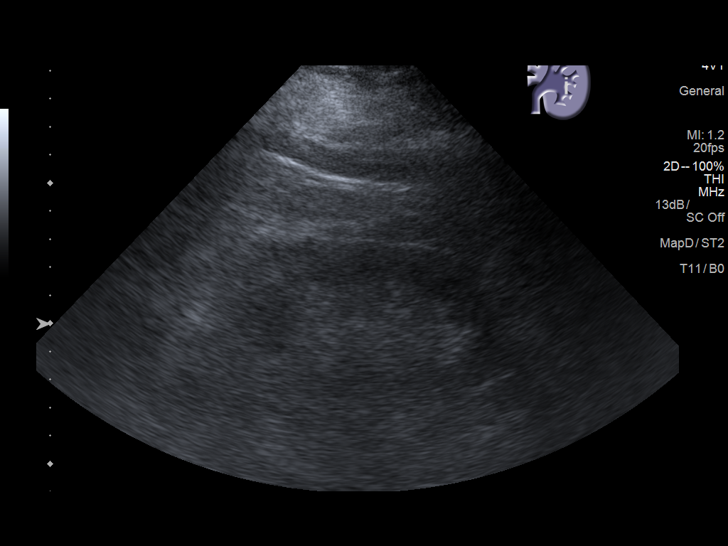
[im 83/91]
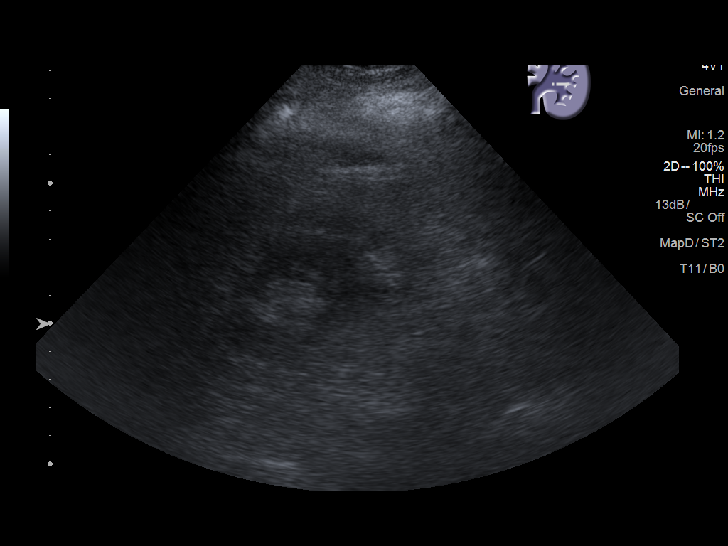
[im 91/91]
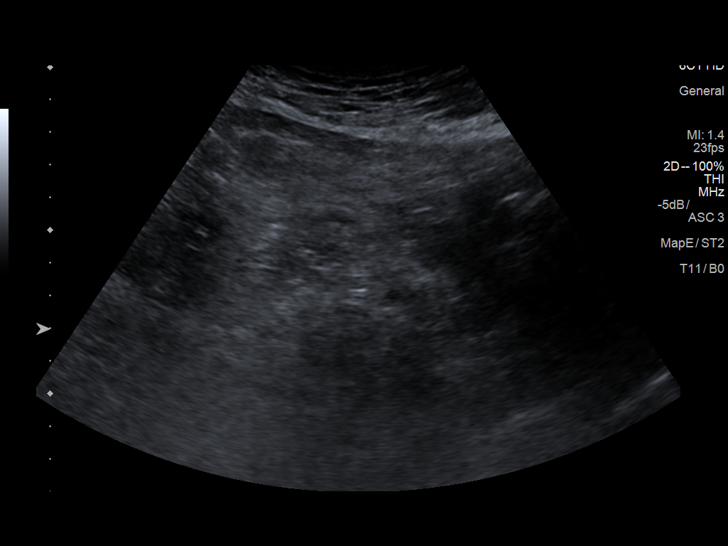

[14 of 25 positions shown; findings below may reference images not displayed]

FINDINGS: Gallbladder: Cholelithiasis. There is gallbladder wall thickening
which is attributed to underdistention. No focal tenderness or
pericholecystic inflammation.

Common bile duct: Diameter: 4 mm. Where visualized, no filling
defect.

Liver: Coarsened echogenic liver correlating with cirrhotic findings
on previous CT. No focal finding. Antegrade flow in the imaged
portal venous system. No ascites.

IVC: No abnormality visualized.

Pancreas: Obscured.  No significant finding on recent CT.

Spleen: Size and appearance within normal limits.

Right Kidney: Length: 10 cm. Echogenicity within normal limits. No
mass or hydronephrosis visualized.

Left Kidney: Length: 10 cm. Echogenicity within normal limits. No
mass or hydronephrosis visualized.

Abdominal aorta: No aneurysm visualized.
IMPRESSION: 1. Cholelithiasis without acute cholecystitis.
2. Cirrhosis without ascites.
3. Obscured pancreas.

## 2018-03-20 ENCOUNTER — Ambulatory Visit: Payer: Medicare Other | Admitting: Internal Medicine

## 2018-03-20 DIAGNOSIS — L299 Pruritus, unspecified: Secondary | ICD-10-CM | POA: Diagnosis not present

## 2018-03-20 DIAGNOSIS — L89610 Pressure ulcer of right heel, unstageable: Secondary | ICD-10-CM | POA: Diagnosis not present

## 2018-03-20 DIAGNOSIS — D631 Anemia in chronic kidney disease: Secondary | ICD-10-CM | POA: Diagnosis not present

## 2018-03-20 DIAGNOSIS — L89620 Pressure ulcer of left heel, unstageable: Secondary | ICD-10-CM | POA: Diagnosis not present

## 2018-03-20 DIAGNOSIS — Z992 Dependence on renal dialysis: Secondary | ICD-10-CM | POA: Diagnosis not present

## 2018-03-20 DIAGNOSIS — I2510 Atherosclerotic heart disease of native coronary artery without angina pectoris: Secondary | ICD-10-CM | POA: Diagnosis not present

## 2018-03-20 DIAGNOSIS — L89890 Pressure ulcer of other site, unstageable: Secondary | ICD-10-CM | POA: Diagnosis not present

## 2018-03-20 DIAGNOSIS — L89153 Pressure ulcer of sacral region, stage 3: Secondary | ICD-10-CM | POA: Diagnosis not present

## 2018-03-20 DIAGNOSIS — N2581 Secondary hyperparathyroidism of renal origin: Secondary | ICD-10-CM | POA: Diagnosis not present

## 2018-03-20 DIAGNOSIS — N186 End stage renal disease: Secondary | ICD-10-CM | POA: Diagnosis not present

## 2018-03-20 DIAGNOSIS — D509 Iron deficiency anemia, unspecified: Secondary | ICD-10-CM | POA: Diagnosis not present

## 2018-03-20 DIAGNOSIS — E1122 Type 2 diabetes mellitus with diabetic chronic kidney disease: Secondary | ICD-10-CM | POA: Diagnosis not present

## 2018-03-21 ENCOUNTER — Encounter: Payer: Self-pay | Admitting: Internal Medicine

## 2018-03-21 ENCOUNTER — Ambulatory Visit (INDEPENDENT_AMBULATORY_CARE_PROVIDER_SITE_OTHER): Payer: Medicare Other | Admitting: Internal Medicine

## 2018-03-21 DIAGNOSIS — A4101 Sepsis due to Methicillin susceptible Staphylococcus aureus: Secondary | ICD-10-CM | POA: Diagnosis present

## 2018-03-21 DIAGNOSIS — L89610 Pressure ulcer of right heel, unstageable: Secondary | ICD-10-CM | POA: Diagnosis not present

## 2018-03-21 DIAGNOSIS — L89153 Pressure ulcer of sacral region, stage 3: Secondary | ICD-10-CM | POA: Diagnosis not present

## 2018-03-21 DIAGNOSIS — I255 Ischemic cardiomyopathy: Secondary | ICD-10-CM

## 2018-03-21 DIAGNOSIS — L89620 Pressure ulcer of left heel, unstageable: Secondary | ICD-10-CM | POA: Diagnosis not present

## 2018-03-21 DIAGNOSIS — L89890 Pressure ulcer of other site, unstageable: Secondary | ICD-10-CM | POA: Diagnosis not present

## 2018-03-21 DIAGNOSIS — E1122 Type 2 diabetes mellitus with diabetic chronic kidney disease: Secondary | ICD-10-CM | POA: Diagnosis not present

## 2018-03-21 DIAGNOSIS — I2510 Atherosclerotic heart disease of native coronary artery without angina pectoris: Secondary | ICD-10-CM | POA: Diagnosis not present

## 2018-03-21 NOTE — Progress Notes (Signed)
[facility]ious [facility]  Patient Active Problem List   Diagnosis Date Noted  . Staphylococcus aureus bacteremia with sepsis [facility])     Priority: High  . Malnutrition of moderate degree 09/06/2017  . Status post thoracentesis   . Colostomy in place [facility])   . ESRD on dialysis ([place])   . Hyponatremia 09/03/2017  . Hemoptysis 09/03/2017  . Hypoglycemia   . Severe protein-calorie malnutrition ([place])   . Acute blood loss anemia   . Type 2 diabetes mellitus with peripheral neuropathy (HCC)   . Chronic systolic heart failure ([place])   . Colostomy care ([place])   . Chronic kidney disease (CKD), stage IV (severe) ([place])   . Dependence on renal dialysis ([place])   . Wounds, multiple   . Supratherapeutic INR   . MSSA bacteremia 08/02/2017  . ESRD (end stage renal disease) ([place])   . Debility   . Acute on chronic systolic CHF (congestive heart failure) ([place])   . Bacteremia due to methicillin susceptible Staphylococcus aureus (MSSA)   . Infected defibrillator ([place])   . Septic hip ([place])   . Severe sepsis with septic shock ([place])   . Cardiogenic shock ([place]) 07/10/2017  . Septic shock ([place]) 07/10/2017  . Cardiorenal syndrome with renal failure   . Thrombocytopenia ([place]) 06/14/2017  . GI bleed 04/27/2017  . Portal hypertensive gastropathy ([place]) 04/27/2017  . Hypokalemia 04/27/2017  . Esophageal dysphagia 04/13/2017  . Hepatic cirrhosis ([place]) 02/24/2017  . Acute on chronic systolic heart failure ([place])   . Biliary disease with obstruction   . AKI (acute kidney injury) ([place])   . Abnormal LFTs 05/10/2016  . Pain in the chest   . Right heart failure ([place]) 09/26/2015  . History of colonic polyps 09/24/2015  . Chronic atrial fibrillation ([place])   . Bleeding from colostomy stoma ([place]) 08/23/2015  . Anemia of chronic disease 07/21/2015  . HCAP (healthcare-associated pneumonia) 07/21/2015  . Constipation 05/01/2015  . Transaminitis 05/01/2015  . NSTEMI- 04/23/15- Myoview scar- no  cath 04/27/2015  . Acute respiratory failure ([place]) 04/23/2015  . Chronic cholecystitis 04/04/2015  . Cystitis with hematuria-April 2016 04/04/2015  . Chronically elevated transaminase level-Amiodarone resumed 04/24/15 04/03/2015  . H/O ventricular fibrillation-March 2016 02/24/2015  . BiV ICD (St Jude) gen change 04/10/15 02/24/2015  . Hyperlipidemia   . Dietary noncompliance   . Orthostatic hypotension 07/19/2014  . OSA on CPAP   . GERD (gastroesophageal reflux disease) 06/05/2014  . Gallbladder polyp 03/06/2014  . DM type 2, uncontrolled, with renal complications ([place]) 62/83/1517  . Dizziness 01/26/2014  . Obesity 12/12/2013  . Chronic hypotension 12/11/2013  . Chronic kidney disease, stage IV (severe) ([place]) 12/11/2013  . Chronic a-fib ([place])   . CAD with LCX disease and stents to LAD 07/10/2013  . Long term current use of anticoagulant therapy 03/23/2013  . History of rectal cancer   . Ischemic cardiomyopathy-EF 35% 04/24/15 echo     Patient's Medications  New Prescriptions   No medications on file  Previous Medications   ATORVASTATIN (LIPITOR) 10 MG TABLET    Take 1 tablet (10 mg total) by mouth daily.   CEPHALEXIN (KEFLEX) 250 MG CAPSULE    Take 250 capsules by mouth 2 (two) times daily. Staph infection   COLCHICINE 0.6 MG TABLET    Take 0.5 tablets (0.3 mg total) by mouth 2 (two) times a week.   FEXOFENADINE (ALLEGRA) 180 MG TABLET    Take 180 mg by  mouth daily.   GLUCOSE BLOOD TEST STRIP    Use to test blood sugar 3 times daily   HYDROCODONE-ACETAMINOPHEN (NORCO) 7.5-325 MG TABLET    Take 1-2 tablets by mouth every 4 (four) hours as needed for moderate pain.   LEVOTHYROXINE (SYNTHROID, LEVOTHROID) 75 MCG TABLET    Take 75 mcg by mouth daily before breakfast.   MIDODRINE (PROAMATINE) 5 MG TABLET    Take 3 tablets (15 mg total) by mouth 2 (two) times daily with a meal.   NITROGLYCERIN (NITROSTAT) 0.4 MG SL TABLET    Place 1 tablet (0.4 mg total) under the tongue every 5 (five)  minutes as needed for chest pain.   NOVOLOG FLEXPEN 100 UNIT/ML FLEXPEN    Inject 3 Units into the skin 3 (three) times daily.   PANTOPRAZOLE (PROTONIX) 40 MG TABLET    Take 1 tablet (40 mg total) by mouth daily.   PROAIR RESPICLICK 322 (90 BASE) MCG/ACT AEPB    Inhale 1 puff into the lungs every 6 (six) hours as needed (for breathing).    SUCROFERRIC OXYHYDROXIDE (VELPHORO) 500 MG CHEWABLE TABLET    Chew 1 tablet (500 mg total) by mouth 3 (three) times daily with meals.   WARFARIN (COUMADIN) 1 MG TABLET    Take 1 tablet (1 mg total) by mouth daily.  Modified Medications   No medications on file  Discontinued Medications   No medications on file    Subjective: Mr. Frutiger is in for his routine follow-up visit.  He developed MSSA bacteremia last August.  He had no evidence of endocarditis by TEE but has an implantable defibrillator.  He completed 6 weeks of IV cefazolin on 08/20/2017 then elected to start chronic oral cephalexin.  He is having no problems tolerating cephalexin.  He is feeling better.  His sacral wound VAC was removed last week.  He is making progress with physical therapy and he has started driving his car again.  Review of Systems: Review of Systems  Constitutional: Negative for chills, fever and malaise/fatigue.  Gastrointestinal: Negative for abdominal pain, diarrhea, nausea and vomiting.    Past Medical History:  Diagnosis Date  . Adenocarcinoma of rectum ([place])    a. 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 AICD (automatic cardioverter/defibrillator) present 2002   BI V ICD  . Anemia   . CAD (coronary artery disease)    a. BMS to LAD 2001 at [facility] b. PTCA/atherectomy ramus and BMS to LAD 2009  . CHF (congestive heart failure) ([place])   . Cholelithiasis 06/2015  . Chronic kidney disease, stage IV (severe) ([place])   . Chronic systolic heart failure ([place])   . Cirrhosis ([place])   . Colostomy in place [facility])   . Dizziness    a. chronic. Admission for this 07/18/2014  . DM type 2, uncontrolled, with  renal complications ([place])   . Dysrhythmia   . Essential hypertension, benign   . GERD (gastroesophageal reflux disease)   . HCAP (healthcare-associated pneumonia) 07/21/2015  . Hematuria   . History of blood transfusion    "I've had 2 units so far this year" (09/27/2015)  . HLD (hyperlipidemia)   . Ischemic cardiomyopathy    EF 18% by nuclear study 2016, multiple myocardial infarctions in past    . Myocardial infarction ([place]) 2001  . Obesity   . Orthostatic hypotension   . OSA on CPAP   . Paroxysmal atrial fibrillation (HCC)    a. on amiodarone, digoxin and Eliquis  . PONV (postoperative nausea and vomiting)   .  Presence of permanent cardiac pacemaker   . Prostate cancer ([place])    a. s/p seed implants with chemo and radiation  . SAH (subarachnoid hemorrhage) ([place])    post-traumatic (fall) [facility] 12/2014  . TIA (transient ischemic attack)     Social History   Tobacco Use  . Smoking status: Never Smoker  . Smokeless tobacco: Never Used  Substance Use Topics  . Alcohol use: No    Alcohol/week: 0.0 oz    Comment: Former user 45 years ago  . Drug use: No    Family History  Problem Relation Age of Onset  . Colon cancer Mother 82  . Coronary artery disease Father   . Colon cancer Sister 13  . Diabetes Sister   . Colon cancer Other        2 cousins, succumbed to illness    No Known Allergies  Objective: Vitals:   03/21/18 0910  BP: 109/70  Pulse: 74  Temp: (!) 97.4 F (36.3 C)  TempSrc: Oral  Weight: 171 lb (77.6 kg)  Height: 5\' 10"  (1.778 m)   Body mass index is 24.54 kg/m.  Physical Exam  Constitutional:  He is accompanied by his wife.  He is in good spirits.  He is seated in a wheelchair but tells me that next time he will not need it.  Cardiovascular: Normal rate, regular rhythm and normal heart sounds.  Left anterior chest ICD site appears normal.  Pulmonary/Chest: Effort normal and breath sounds normal.    Lab Results    Problem List Items Addressed  This Visit      High   Staphylococcus aureus bacteremia with sepsis ([place])    I discussed the risks and benefits of continuing long-term cephalexin again with him today.  Since he is tolerating it well and does not ever want to go through staph aureus bacteremia again he elects to continue long-term cephalexin for now.  He will follow-up in 6 months.          Michel Bickers, MD [facility] [phone] pager   [phone] cell 03/21/2018, 9:39 AM

## 2018-03-21 NOTE — Assessment & Plan Note (Signed)
I discussed the risks and benefits of continuing long-term cephalexin again with him today.  Since he is tolerating it well and does not ever want to go through staph aureus bacteremia again he elects to continue long-term cephalexin for now.  He will follow-up in 6 months.

## 2018-03-22 DIAGNOSIS — N2581 Secondary hyperparathyroidism of renal origin: Secondary | ICD-10-CM | POA: Diagnosis not present

## 2018-03-22 DIAGNOSIS — Z992 Dependence on renal dialysis: Secondary | ICD-10-CM | POA: Diagnosis not present

## 2018-03-22 DIAGNOSIS — N186 End stage renal disease: Secondary | ICD-10-CM | POA: Diagnosis not present

## 2018-03-22 DIAGNOSIS — L299 Pruritus, unspecified: Secondary | ICD-10-CM | POA: Diagnosis not present

## 2018-03-22 DIAGNOSIS — D509 Iron deficiency anemia, unspecified: Secondary | ICD-10-CM | POA: Diagnosis not present

## 2018-03-22 DIAGNOSIS — D631 Anemia in chronic kidney disease: Secondary | ICD-10-CM | POA: Diagnosis not present

## 2018-03-23 DIAGNOSIS — E1122 Type 2 diabetes mellitus with diabetic chronic kidney disease: Secondary | ICD-10-CM | POA: Diagnosis not present

## 2018-03-23 DIAGNOSIS — L89610 Pressure ulcer of right heel, unstageable: Secondary | ICD-10-CM | POA: Diagnosis not present

## 2018-03-23 DIAGNOSIS — L89153 Pressure ulcer of sacral region, stage 3: Secondary | ICD-10-CM | POA: Diagnosis not present

## 2018-03-23 DIAGNOSIS — L89620 Pressure ulcer of left heel, unstageable: Secondary | ICD-10-CM | POA: Diagnosis not present

## 2018-03-23 DIAGNOSIS — I2510 Atherosclerotic heart disease of native coronary artery without angina pectoris: Secondary | ICD-10-CM | POA: Diagnosis not present

## 2018-03-23 DIAGNOSIS — L89890 Pressure ulcer of other site, unstageable: Secondary | ICD-10-CM | POA: Diagnosis not present

## 2018-03-24 DIAGNOSIS — D631 Anemia in chronic kidney disease: Secondary | ICD-10-CM | POA: Diagnosis not present

## 2018-03-24 DIAGNOSIS — N2581 Secondary hyperparathyroidism of renal origin: Secondary | ICD-10-CM | POA: Diagnosis not present

## 2018-03-24 DIAGNOSIS — D509 Iron deficiency anemia, unspecified: Secondary | ICD-10-CM | POA: Diagnosis not present

## 2018-03-24 DIAGNOSIS — Z992 Dependence on renal dialysis: Secondary | ICD-10-CM | POA: Diagnosis not present

## 2018-03-24 DIAGNOSIS — N186 End stage renal disease: Secondary | ICD-10-CM | POA: Diagnosis not present

## 2018-03-24 DIAGNOSIS — L299 Pruritus, unspecified: Secondary | ICD-10-CM | POA: Diagnosis not present

## 2018-03-27 DIAGNOSIS — N2581 Secondary hyperparathyroidism of renal origin: Secondary | ICD-10-CM | POA: Diagnosis not present

## 2018-03-27 DIAGNOSIS — N186 End stage renal disease: Secondary | ICD-10-CM | POA: Diagnosis not present

## 2018-03-27 DIAGNOSIS — D631 Anemia in chronic kidney disease: Secondary | ICD-10-CM | POA: Diagnosis not present

## 2018-03-27 DIAGNOSIS — Z992 Dependence on renal dialysis: Secondary | ICD-10-CM | POA: Diagnosis not present

## 2018-03-27 DIAGNOSIS — L299 Pruritus, unspecified: Secondary | ICD-10-CM | POA: Diagnosis not present

## 2018-03-27 DIAGNOSIS — D509 Iron deficiency anemia, unspecified: Secondary | ICD-10-CM | POA: Diagnosis not present

## 2018-03-28 ENCOUNTER — Ambulatory Visit (INDEPENDENT_AMBULATORY_CARE_PROVIDER_SITE_OTHER): Payer: Medicare Other | Admitting: *Deleted

## 2018-03-28 ENCOUNTER — Telehealth: Payer: Self-pay | Admitting: *Deleted

## 2018-03-28 DIAGNOSIS — I482 Chronic atrial fibrillation, unspecified: Secondary | ICD-10-CM

## 2018-03-28 DIAGNOSIS — Z7901 Long term (current) use of anticoagulants: Secondary | ICD-10-CM

## 2018-03-28 DIAGNOSIS — L89620 Pressure ulcer of left heel, unstageable: Secondary | ICD-10-CM | POA: Diagnosis not present

## 2018-03-28 DIAGNOSIS — Z5181 Encounter for therapeutic drug level monitoring: Secondary | ICD-10-CM

## 2018-03-28 DIAGNOSIS — L89153 Pressure ulcer of sacral region, stage 3: Secondary | ICD-10-CM | POA: Diagnosis not present

## 2018-03-28 DIAGNOSIS — L89610 Pressure ulcer of right heel, unstageable: Secondary | ICD-10-CM | POA: Diagnosis not present

## 2018-03-28 DIAGNOSIS — I2510 Atherosclerotic heart disease of native coronary artery without angina pectoris: Secondary | ICD-10-CM | POA: Diagnosis not present

## 2018-03-28 DIAGNOSIS — L89890 Pressure ulcer of other site, unstageable: Secondary | ICD-10-CM | POA: Diagnosis not present

## 2018-03-28 DIAGNOSIS — E1122 Type 2 diabetes mellitus with diabetic chronic kidney disease: Secondary | ICD-10-CM | POA: Diagnosis not present

## 2018-03-28 LAB — POCT INR: INR: 2.4

## 2018-03-28 NOTE — Telephone Encounter (Signed)
Done.  See coumadin note. 

## 2018-03-28 NOTE — Telephone Encounter (Signed)
inr 2.4

## 2018-03-28 NOTE — Patient Instructions (Signed)
Continue coumadin 1 tablet daily.  Recheck in 4 week  Order given to [place]

## 2018-03-29 DIAGNOSIS — N186 End stage renal disease: Secondary | ICD-10-CM | POA: Diagnosis not present

## 2018-03-29 DIAGNOSIS — D509 Iron deficiency anemia, unspecified: Secondary | ICD-10-CM | POA: Diagnosis not present

## 2018-03-29 DIAGNOSIS — L299 Pruritus, unspecified: Secondary | ICD-10-CM | POA: Diagnosis not present

## 2018-03-29 DIAGNOSIS — D631 Anemia in chronic kidney disease: Secondary | ICD-10-CM | POA: Diagnosis not present

## 2018-03-29 DIAGNOSIS — Z992 Dependence on renal dialysis: Secondary | ICD-10-CM | POA: Diagnosis not present

## 2018-03-29 DIAGNOSIS — N2581 Secondary hyperparathyroidism of renal origin: Secondary | ICD-10-CM | POA: Diagnosis not present

## 2018-03-30 ENCOUNTER — Encounter (HOSPITAL_COMMUNITY): Payer: Self-pay | Admitting: Internal Medicine

## 2018-03-30 ENCOUNTER — Ambulatory Visit (HOSPITAL_BASED_OUTPATIENT_CLINIC_OR_DEPARTMENT_OTHER)
Admission: RE | Admit: 2018-03-30 | Discharge: 2018-03-30 | Disposition: A | Discharge status: Home / Self Care | Payer: Medicare Other | Source: Ambulatory Visit | Attending: Internal Medicine | Admitting: Internal Medicine

## 2018-03-30 ENCOUNTER — Ambulatory Visit (HOSPITAL_COMMUNITY)
Admission: RE | Admit: 2018-03-30 | Discharge: 2018-03-30 | Disposition: A | Discharge status: Home / Self Care | Payer: Medicare Other | Source: Ambulatory Visit | Attending: Internal Medicine | Admitting: Internal Medicine

## 2018-03-30 ENCOUNTER — Other Ambulatory Visit: Payer: Self-pay

## 2018-03-30 VITALS — BP 107/61 | HR 72 | Wt 172.5 lb

## 2018-03-30 DIAGNOSIS — Z8546 Personal history of malignant neoplasm of prostate: Secondary | ICD-10-CM | POA: Diagnosis not present

## 2018-03-30 DIAGNOSIS — I255 Ischemic cardiomyopathy: Secondary | ICD-10-CM | POA: Insufficient documentation

## 2018-03-30 DIAGNOSIS — E785 Hyperlipidemia, unspecified: Secondary | ICD-10-CM | POA: Insufficient documentation

## 2018-03-30 DIAGNOSIS — Z8249 Family history of ischemic heart disease and other diseases of the circulatory system: Secondary | ICD-10-CM | POA: Diagnosis not present

## 2018-03-30 DIAGNOSIS — Z85048 Personal history of other malignant neoplasm of rectum, rectosigmoid junction, and anus: Secondary | ICD-10-CM | POA: Insufficient documentation

## 2018-03-30 DIAGNOSIS — L89153 Pressure ulcer of sacral region, stage 3: Secondary | ICD-10-CM | POA: Diagnosis not present

## 2018-03-30 DIAGNOSIS — Z79890 Hormone replacement therapy (postmenopausal): Secondary | ICD-10-CM | POA: Insufficient documentation

## 2018-03-30 DIAGNOSIS — Z7901 Long term (current) use of anticoagulants: Secondary | ICD-10-CM | POA: Insufficient documentation

## 2018-03-30 DIAGNOSIS — Z955 Presence of coronary angioplasty implant and graft: Secondary | ICD-10-CM | POA: Insufficient documentation

## 2018-03-30 DIAGNOSIS — G4733 Obstructive sleep apnea (adult) (pediatric): Secondary | ICD-10-CM | POA: Insufficient documentation

## 2018-03-30 DIAGNOSIS — Z8673 Personal history of transient ischemic attack (TIA), and cerebral infarction without residual deficits: Secondary | ICD-10-CM | POA: Diagnosis not present

## 2018-03-30 DIAGNOSIS — I482 Chronic atrial fibrillation, unspecified: Secondary | ICD-10-CM

## 2018-03-30 DIAGNOSIS — I5022 Chronic systolic (congestive) heart failure: Secondary | ICD-10-CM

## 2018-03-30 DIAGNOSIS — I2510 Atherosclerotic heart disease of native coronary artery without angina pectoris: Secondary | ICD-10-CM

## 2018-03-30 DIAGNOSIS — I132 Hypertensive heart and chronic kidney disease with heart failure and with stage 5 chronic kidney disease, or end stage renal disease: Secondary | ICD-10-CM | POA: Diagnosis not present

## 2018-03-30 DIAGNOSIS — E1122 Type 2 diabetes mellitus with diabetic chronic kidney disease: Secondary | ICD-10-CM | POA: Diagnosis not present

## 2018-03-30 DIAGNOSIS — I252 Old myocardial infarction: Secondary | ICD-10-CM | POA: Diagnosis not present

## 2018-03-30 DIAGNOSIS — L89890 Pressure ulcer of other site, unstageable: Secondary | ICD-10-CM | POA: Diagnosis not present

## 2018-03-30 DIAGNOSIS — K219 Gastro-esophageal reflux disease without esophagitis: Secondary | ICD-10-CM | POA: Insufficient documentation

## 2018-03-30 DIAGNOSIS — Z79899 Other long term (current) drug therapy: Secondary | ICD-10-CM | POA: Insufficient documentation

## 2018-03-30 DIAGNOSIS — N184 Chronic kidney disease, stage 4 (severe): Secondary | ICD-10-CM | POA: Insufficient documentation

## 2018-03-30 DIAGNOSIS — L89610 Pressure ulcer of right heel, unstageable: Secondary | ICD-10-CM | POA: Diagnosis not present

## 2018-03-30 DIAGNOSIS — L89620 Pressure ulcer of left heel, unstageable: Secondary | ICD-10-CM | POA: Diagnosis not present

## 2018-03-30 DIAGNOSIS — Z794 Long term (current) use of insulin: Secondary | ICD-10-CM | POA: Insufficient documentation

## 2018-03-30 LAB — CUP PACEART REMOTE DEVICE CHECK
Battery Remaining Longevity: 46 mo
Battery Voltage: 2.98 V
HighPow Impedance: 34 Ohm
HighPow Impedance: 34 Ohm
Implantable Lead Implant Date: 20091026
Implantable Lead Location: 753859
Implantable Lead Location: 753860
Implantable Lead Model: 4196
Implantable Lead Serial Number: 125581
Implantable Pulse Generator Implant Date: 20160505
Lead Channel Impedance Value: 410 Ohm
Lead Channel Pacing Threshold Amplitude: 0.75 V
Lead Channel Pacing Threshold Amplitude: 1 V
Lead Channel Pacing Threshold Pulse Width: 0.5 ms
Lead Channel Pacing Threshold Pulse Width: 0.8 ms
Lead Channel Setting Pacing Pulse Width: 0.5 ms
Lead Channel Setting Sensing Sensitivity: 2 mV
MDC IDC LEAD IMPLANT DT: 20020906
MDC IDC LEAD IMPLANT DT: 20091026
MDC IDC LEAD LOCATION: 753858
MDC IDC MSMT BATTERY REMAINING PERCENTAGE: 57 %
MDC IDC MSMT LEADCHNL RA IMPEDANCE VALUE: 380 Ohm
MDC IDC MSMT LEADCHNL RA SENSING INTR AMPL: 1.7 mV
MDC IDC MSMT LEADCHNL RV IMPEDANCE VALUE: 550 Ohm
MDC IDC MSMT LEADCHNL RV SENSING INTR AMPL: 10.1 mV
MDC IDC PG SERIAL: 7238042
MDC IDC SESS DTM: 20190402085744
MDC IDC SET LEADCHNL LV PACING AMPLITUDE: 2 V
MDC IDC SET LEADCHNL RV PACING AMPLITUDE: 2.5 V
MDC IDC SET LEADCHNL RV PACING PULSEWIDTH: 0.8 ms

## 2018-03-30 NOTE — Addendum Note (Signed)
Encounter addended by: Scarlette Calico, RN on: 03/30/2018 10:39 AM  Actions taken: Sign clinical note

## 2018-03-30 NOTE — Progress Notes (Signed)
Patient ID: [name], male   DOB: 09/02/1956, 62 y.o.   MRN: [id number]    Advanced Heart Failure Clinic Note   PCP: Robert. [name] Oncologist: [name].  CHF: [name]  [name] is a 62 yo with history of CAD and systolic HF due to ischemic cardiomyopathy EF 20-25%, BiV ICD upgrade 2009, chronic atrial fibrillation s/p AVN ablation 9/16, CKD, and traumatic SAH in 1/16 after a fall.  He has been followed at the heart failure clinic at [name] by [name] in the past.   He was admitted in 3/16 from [name] with exertional dyspnea/volume overload and had a cardiac arrest/ventricular fibrillation while in the hospital terminated by his ICD. After diuresis, RHC showed relatively normal filling pressures and preserved cardiac index.  Creatinine peaked at 3.0. (was 5.0 in 1/16).  Echo in 3/16 showed EF 25-30% with restrictive diastolic function.  Cardiolite was done, showing areas of scar but no ischemia.  EF 18%.    Admitted 4/16 with hematuria and elevated LFTs. Questionable amiodarone toxicity and this was stopped.   In 5/16 underwent elective BiV ICD generator change which went without complication. He was admitted again 04/23/15 with acute on chronic CHF and respiratory failure. During that admission he ruled in for a NSTEMI- Troponin 4.29. He has chronic stage 4 CKD and it was decided to obtain a Myoview before considering a cath. Myoview 04/26/15 showed EF 20% with large region of prior infarct involving the apical anterior, anteroseptal, inferoseptal and inferior walls with extension to the true apex. Small region of reversible/inducible ischemia along the margin of the prior infarct at the mid ventricular anterior and anteroseptal walls. No change from previous.   Redmitted in 9/16 with bleeding from stoma site and atrial fibrillation with RVR.  Eliquis was held and bleeding stopped.  Eliquis was restarted prior to discharge. He was noted to have < 50% BiV pacing and also to be very volume  overloaded. He was then diuresed and had AV nodal ablation.    Readmitted 7/17 with cholangitis and sepsis. Required ERCP with biliary stent. C/b acute on chronic renal failure requiring HD.   Had prolonged hospitalization in 8/18 for MSSA bacteremia/sepsis c/b shock and worsening renal failure. Developed ESRD and HD started. TEE negative for vegetation. EF 15-20%. RV down. Hospitalization c/b severe debility, malnutrition and skin wounds. Was d/c'd to [name] for several weeks. Completed IV ANCEF course.   He presents today for regular follow up. Tolerating HD MWF, but tired afterward. BP is doing better with midrorine - usually SBP >100. Working with HHPT and improving. Feels like he is getting stronger.  Able to walk short distances with walker. [name] with neuropathy pain. Sacral wound vac is now off - 1/2 cm left to heal. Appetite has improved. Had some BLE edema that resolved with TED hose. No CP, palpitations, dizziness. No orthopnea/PND. Weights at HD: 167 - 172 lbs. Compliant with meds.   Echo today EF 20-25% RV moderately reduced Personally reviewed   Corevue: No VT/VT. 96% bi-v pacing. Thoracic impedence right at threshold, trending up.    Labs (12/15): LDL 123 Labs (3/16): K 3.8, creatinine 2.78 Labs (03/10/2015): K 4.5 Creatinine 2.49  TSH 4.58, LFTs normal, digoxin 0.8 Labs (04/26/15): K 3.5 creatinine 2.13 Labs (08/04/2015): K 3.3 Creatinine 3.01  Labs (9/16): K 4.1, creatinine 2.7, HCT 30.8 Labs (4/17): K 3.5, creatinine 2.67 Labs (05/12/16): K 3.7, creatinine 2.66 Labs (10/20/16): K 3.6, creatinine 2.71 Labs (01/26/17) K 3.1, creatinine 2.70  PMH: 1. HTN 2. Type II diabetes 3. CAD: s/p BMS LAD in 2001, PTCA ramus and BMS LAD in 2009.  Cardiolite (3/16) with EF 18%, no ischemia, prior anterior, apical and inferior infarction. NSTEMI in 5/16.  Myoview done 04/26/15 showed EF 20% with large region of prior infarct involving the apical anterior, anteroseptal, inferoseptal and inferior walls  with extension to the true apex. Small region of reversible/inducible ischemia along the margin of the prior infarct at the mid ventricular anterior and anteroseptal walls. No change from previous.  4. Chronic systolic CHF: Ischemic CMP.  St Jude CRT-D.  Echo (3/16) with EF 25-30%, restrictive diastolic function, mild LVH, mild MR.  RHC (3/16) with mean RA 9, PA 47/29, mean PCWP 16, CI 2.5 ([place]).  Suspect ACEI cough. Echo (7/17) with EF 15-20%. Myoview 18%.   5. SAH: 1/16 after fall (traumatic).   6. GERD 7. Atrial fibrillation: Paroxysmal initially but now chronic.  Now s/p AV nodal ablation to allow BiV pacing. 8. Rectal cancer: s/p surgery. Has colostomy.  9. Prostate cancer: s/p chemo/radiation.  10. H/o TIA 11. OSA: On CPAP.  12. Hyperlipidemia 13. CKD stage IV: Followed by [name].  14. Bleeding from stoma.   SH: Married, lives in [name], nonsmoker.   FH: CAD  Review of systems complete and found to be negative unless listed in HPI.    Current Outpatient Medications  Medication Sig Dispense Refill  . atorvastatin (LIPITOR) 10 MG tablet Take 1 tablet (10 mg total) by mouth daily. 90 tablet 3  . cephALEXin (KEFLEX) 250 MG capsule Take 250 capsules by mouth 2 (two) times daily. Staph infection    . fexofenadine (ALLEGRA) 180 MG tablet Take 180 mg by mouth daily.    Marland Kitchen glucose blood test strip Use to test blood sugar 3 times daily 100 each 4  . HYDROcodone-acetaminophen (NORCO) 7.5-325 MG tablet Take 1-2 tablets by mouth every 4 (four) hours as needed for moderate pain. 10 tablet 0  . levothyroxine (SYNTHROID, LEVOTHROID) 75 MCG tablet Take 75 mcg by mouth daily before breakfast.    . midodrine (PROAMATINE) 5 MG tablet Take 3 tablets (15 mg total) by mouth 2 (two) times daily with a meal. 180 tablet 3  . nitroGLYCERIN (NITROSTAT) 0.4 MG SL tablet Place 1 tablet (0.4 mg total) under the tongue every 5 (five) minutes as needed for chest pain. 25 tablet 3  . NOVOLOG FLEXPEN 100  UNIT/ML FlexPen Inject 3 Units into the skin 3 (three) times daily.  11  . pantoprazole (PROTONIX) 40 MG tablet Take 1 tablet (40 mg total) by mouth daily. 30 tablet 0  . PROAIR RESPICLICK 108 (90 BASE) MCG/ACT AEPB Inhale 1 puff into the lungs every 6 (six) hours as needed (for breathing).   0  . sucroferric oxyhydroxide (VELPHORO) 500 MG chewable tablet Chew 1 tablet (500 mg total) by mouth 3 (three) times daily with meals. 90 tablet 0  . warfarin (COUMADIN) 1 MG tablet Take 1 tablet (1 mg total) by mouth daily. 30 tablet 3   No current facility-administered medications for this encounter.     Vitals:   03/30/18 1000  BP: 107/61  Pulse: 72  SpO2: 100%  Weight: 172 lb 8 oz (78.2 kg)   Wt Readings from Last 3 Encounters:  03/30/18 172 lb 8 oz (78.2 kg)  03/21/18 171 lb (77.6 kg)  12/28/17 169 lb 6.4 oz (76.8 kg)   Physical Exam General: Well appearing. No resp difficulty. HEENT: Normal Neck:  Supple. JVP ~10. Carotids 2+ bilat; no bruits. No thyromegaly or nodule noted. Cor: PMI nondisplaced. RRR, 2/6 MR Lungs: CTAB, normal effort. Abdomen: Soft, non-tender, non-distended, no HSM. No bruits or masses. +BS LLE colostomy Extremities: No cyanosis, clubbing, or rash. R and LLE trace edema. RUE AVF Neuro: Alert & orientedx3, cranial nerves grossly intact. moves all 4 extremities w/o difficulty. Affect pleasant   Assessment/Plan: 1. Chronic systolic CHF: Ischemic cardiomyopathy.  - Echo 07/2017 with LVEF 15-20%, Echo today with EF 20-25%, RV moderately reduced - Improving slowly. NYHA III.  - Volume status managed with HD. Looks good on exam and by Corevue. - Off HF meds due to ESRD and low BP. Continue midodrine 15 mg BID - Continue HHPT for now. Not candidate for advanced therapies.  2. Atrial fibrillation: Chronic. CHADS2Vasc Score 5. s/p AV nodal ablation -  s/p AV node ablation with CRT - On warfarin. Denies bleeding.  4. ESRD - As above. Volume mildly elevated. Continue  M/W/F. Likely not a candidate for PD with colostomy - Continue midodrine  15 mg BID per renal.  5. CAD:  NSTEMI in 5/16.  - Cardiolite without high ischemic burden. EF is decreased from previous, but poor cath candidate with renal failure. - No ASA given AC - No s/s ischemia - Continue statin 6. H/O rectal cancer 2008: Has diverting colostomy. Follows with [name].  - CT (05/02/16) without evidence of reoccurrence.  7. H/o staph bacteremia - continue life-long Keflex  Alford [place], NP  03/30/18   Patient seen and examined with the above-signed Advanced Practice Provider and/or Housestaff. I personally reviewed laboratory data, imaging studies and relevant notes. I independently examined the patient and formulated the important aspects of the plan. I have edited the note to reflect any of my changes or salient points. I have personally discussed the plan with the patient and/or family.  Continues to improve slowly. Functional capacity getting better. Volume status well controlled on HD. Continues with HHPT. Echo today EF stable 20-25% Personally reviewed. CAD stable without angina  Continue midodrine for BP support. Encourage more fishing.   Arvilla Meres, MD  10:32 AM

## 2018-03-30 NOTE — Patient Instructions (Signed)
We will contact you in 6 months to schedule your next appointment.  

## 2018-03-30 NOTE — Progress Notes (Signed)
  Echocardiogram 2D Echocardiogram has been performed.  [name] 03/30/2018, 9:36 AM

## 2018-03-31 DIAGNOSIS — N2581 Secondary hyperparathyroidism of renal origin: Secondary | ICD-10-CM | POA: Diagnosis not present

## 2018-03-31 DIAGNOSIS — L299 Pruritus, unspecified: Secondary | ICD-10-CM | POA: Diagnosis not present

## 2018-03-31 DIAGNOSIS — N186 End stage renal disease: Secondary | ICD-10-CM | POA: Diagnosis not present

## 2018-03-31 DIAGNOSIS — Z992 Dependence on renal dialysis: Secondary | ICD-10-CM | POA: Diagnosis not present

## 2018-03-31 DIAGNOSIS — D631 Anemia in chronic kidney disease: Secondary | ICD-10-CM | POA: Diagnosis not present

## 2018-03-31 DIAGNOSIS — D509 Iron deficiency anemia, unspecified: Secondary | ICD-10-CM | POA: Diagnosis not present

## 2018-04-03 DIAGNOSIS — D631 Anemia in chronic kidney disease: Secondary | ICD-10-CM | POA: Diagnosis not present

## 2018-04-03 DIAGNOSIS — N2581 Secondary hyperparathyroidism of renal origin: Secondary | ICD-10-CM | POA: Diagnosis not present

## 2018-04-03 DIAGNOSIS — Z992 Dependence on renal dialysis: Secondary | ICD-10-CM | POA: Diagnosis not present

## 2018-04-03 DIAGNOSIS — N186 End stage renal disease: Secondary | ICD-10-CM | POA: Diagnosis not present

## 2018-04-03 DIAGNOSIS — D509 Iron deficiency anemia, unspecified: Secondary | ICD-10-CM | POA: Diagnosis not present

## 2018-04-03 DIAGNOSIS — L299 Pruritus, unspecified: Secondary | ICD-10-CM | POA: Diagnosis not present

## 2018-04-04 DIAGNOSIS — L89153 Pressure ulcer of sacral region, stage 3: Secondary | ICD-10-CM | POA: Diagnosis not present

## 2018-04-04 DIAGNOSIS — I2510 Atherosclerotic heart disease of native coronary artery without angina pectoris: Secondary | ICD-10-CM | POA: Diagnosis not present

## 2018-04-04 DIAGNOSIS — L89890 Pressure ulcer of other site, unstageable: Secondary | ICD-10-CM | POA: Diagnosis not present

## 2018-04-04 DIAGNOSIS — N186 End stage renal disease: Secondary | ICD-10-CM | POA: Diagnosis not present

## 2018-04-04 DIAGNOSIS — L89610 Pressure ulcer of right heel, unstageable: Secondary | ICD-10-CM | POA: Diagnosis not present

## 2018-04-04 DIAGNOSIS — L89620 Pressure ulcer of left heel, unstageable: Secondary | ICD-10-CM | POA: Diagnosis not present

## 2018-04-04 DIAGNOSIS — Z992 Dependence on renal dialysis: Secondary | ICD-10-CM | POA: Diagnosis not present

## 2018-04-04 DIAGNOSIS — E1122 Type 2 diabetes mellitus with diabetic chronic kidney disease: Secondary | ICD-10-CM | POA: Diagnosis not present

## 2018-04-05 DIAGNOSIS — N186 End stage renal disease: Secondary | ICD-10-CM | POA: Diagnosis not present

## 2018-04-05 DIAGNOSIS — D631 Anemia in chronic kidney disease: Secondary | ICD-10-CM | POA: Diagnosis not present

## 2018-04-05 DIAGNOSIS — N2581 Secondary hyperparathyroidism of renal origin: Secondary | ICD-10-CM | POA: Diagnosis not present

## 2018-04-05 DIAGNOSIS — Z992 Dependence on renal dialysis: Secondary | ICD-10-CM | POA: Diagnosis not present

## 2018-04-05 DIAGNOSIS — D509 Iron deficiency anemia, unspecified: Secondary | ICD-10-CM | POA: Diagnosis not present

## 2018-04-06 ENCOUNTER — Ambulatory Visit: Payer: Medicare Other | Admitting: Vascular Surgery

## 2018-04-06 DIAGNOSIS — L89890 Pressure ulcer of other site, unstageable: Secondary | ICD-10-CM | POA: Diagnosis not present

## 2018-04-06 DIAGNOSIS — L89153 Pressure ulcer of sacral region, stage 3: Secondary | ICD-10-CM | POA: Diagnosis not present

## 2018-04-06 DIAGNOSIS — E1122 Type 2 diabetes mellitus with diabetic chronic kidney disease: Secondary | ICD-10-CM | POA: Diagnosis not present

## 2018-04-06 DIAGNOSIS — I2510 Atherosclerotic heart disease of native coronary artery without angina pectoris: Secondary | ICD-10-CM | POA: Diagnosis not present

## 2018-04-06 DIAGNOSIS — L89610 Pressure ulcer of right heel, unstageable: Secondary | ICD-10-CM | POA: Diagnosis not present

## 2018-04-06 DIAGNOSIS — L89620 Pressure ulcer of left heel, unstageable: Secondary | ICD-10-CM | POA: Diagnosis not present

## 2018-04-07 DIAGNOSIS — D631 Anemia in chronic kidney disease: Secondary | ICD-10-CM | POA: Diagnosis not present

## 2018-04-07 DIAGNOSIS — N186 End stage renal disease: Secondary | ICD-10-CM | POA: Diagnosis not present

## 2018-04-07 DIAGNOSIS — D509 Iron deficiency anemia, unspecified: Secondary | ICD-10-CM | POA: Diagnosis not present

## 2018-04-07 DIAGNOSIS — N2581 Secondary hyperparathyroidism of renal origin: Secondary | ICD-10-CM | POA: Diagnosis not present

## 2018-04-07 DIAGNOSIS — Z992 Dependence on renal dialysis: Secondary | ICD-10-CM | POA: Diagnosis not present

## 2018-04-10 DIAGNOSIS — N186 End stage renal disease: Secondary | ICD-10-CM | POA: Diagnosis not present

## 2018-04-10 DIAGNOSIS — Z992 Dependence on renal dialysis: Secondary | ICD-10-CM | POA: Diagnosis not present

## 2018-04-10 DIAGNOSIS — D509 Iron deficiency anemia, unspecified: Secondary | ICD-10-CM | POA: Diagnosis not present

## 2018-04-10 DIAGNOSIS — N2581 Secondary hyperparathyroidism of renal origin: Secondary | ICD-10-CM | POA: Diagnosis not present

## 2018-04-10 DIAGNOSIS — D631 Anemia in chronic kidney disease: Secondary | ICD-10-CM | POA: Diagnosis not present

## 2018-04-11 DIAGNOSIS — I2510 Atherosclerotic heart disease of native coronary artery without angina pectoris: Secondary | ICD-10-CM | POA: Diagnosis not present

## 2018-04-11 DIAGNOSIS — L89890 Pressure ulcer of other site, unstageable: Secondary | ICD-10-CM | POA: Diagnosis not present

## 2018-04-11 DIAGNOSIS — L89153 Pressure ulcer of sacral region, stage 3: Secondary | ICD-10-CM | POA: Diagnosis not present

## 2018-04-11 DIAGNOSIS — L89620 Pressure ulcer of left heel, unstageable: Secondary | ICD-10-CM | POA: Diagnosis not present

## 2018-04-11 DIAGNOSIS — E1122 Type 2 diabetes mellitus with diabetic chronic kidney disease: Secondary | ICD-10-CM | POA: Diagnosis not present

## 2018-04-11 DIAGNOSIS — L89610 Pressure ulcer of right heel, unstageable: Secondary | ICD-10-CM | POA: Diagnosis not present

## 2018-04-12 ENCOUNTER — Other Ambulatory Visit (HOSPITAL_COMMUNITY): Payer: Self-pay | Admitting: Internal Medicine

## 2018-04-12 DIAGNOSIS — Z992 Dependence on renal dialysis: Secondary | ICD-10-CM | POA: Diagnosis not present

## 2018-04-12 DIAGNOSIS — N2581 Secondary hyperparathyroidism of renal origin: Secondary | ICD-10-CM | POA: Diagnosis not present

## 2018-04-12 DIAGNOSIS — D509 Iron deficiency anemia, unspecified: Secondary | ICD-10-CM | POA: Diagnosis not present

## 2018-04-12 DIAGNOSIS — N186 End stage renal disease: Secondary | ICD-10-CM | POA: Diagnosis not present

## 2018-04-12 DIAGNOSIS — D631 Anemia in chronic kidney disease: Secondary | ICD-10-CM | POA: Diagnosis not present

## 2018-04-13 DIAGNOSIS — L89890 Pressure ulcer of other site, unstageable: Secondary | ICD-10-CM | POA: Diagnosis not present

## 2018-04-13 DIAGNOSIS — E1122 Type 2 diabetes mellitus with diabetic chronic kidney disease: Secondary | ICD-10-CM | POA: Diagnosis not present

## 2018-04-13 DIAGNOSIS — L89153 Pressure ulcer of sacral region, stage 3: Secondary | ICD-10-CM | POA: Diagnosis not present

## 2018-04-13 DIAGNOSIS — L89610 Pressure ulcer of right heel, unstageable: Secondary | ICD-10-CM | POA: Diagnosis not present

## 2018-04-13 DIAGNOSIS — L89620 Pressure ulcer of left heel, unstageable: Secondary | ICD-10-CM | POA: Diagnosis not present

## 2018-04-13 DIAGNOSIS — I2510 Atherosclerotic heart disease of native coronary artery without angina pectoris: Secondary | ICD-10-CM | POA: Diagnosis not present

## 2018-04-14 DIAGNOSIS — N2581 Secondary hyperparathyroidism of renal origin: Secondary | ICD-10-CM | POA: Diagnosis not present

## 2018-04-14 DIAGNOSIS — N186 End stage renal disease: Secondary | ICD-10-CM | POA: Diagnosis not present

## 2018-04-14 DIAGNOSIS — Z992 Dependence on renal dialysis: Secondary | ICD-10-CM | POA: Diagnosis not present

## 2018-04-14 DIAGNOSIS — D631 Anemia in chronic kidney disease: Secondary | ICD-10-CM | POA: Diagnosis not present

## 2018-04-14 DIAGNOSIS — D509 Iron deficiency anemia, unspecified: Secondary | ICD-10-CM | POA: Diagnosis not present

## 2018-04-16 IMAGING — US US ABDOMEN LIMITED
1 series · 14 of 25 positions shown · non-contrast
Comparison: Abdominal ultrasound May 10, 2016

CLINICAL DATA: Right upper quadrant abdominal pain since midnight,
known kidney stones, cirrhosis, diabetes, prostate malignancy.

EXAM:
US ABDOMEN LIMITED - RIGHT UPPER QUADRANT

[Series 1: us abdomen limited · 0.23mm/px · 14 of 44 slices shown]
[im 1/44]
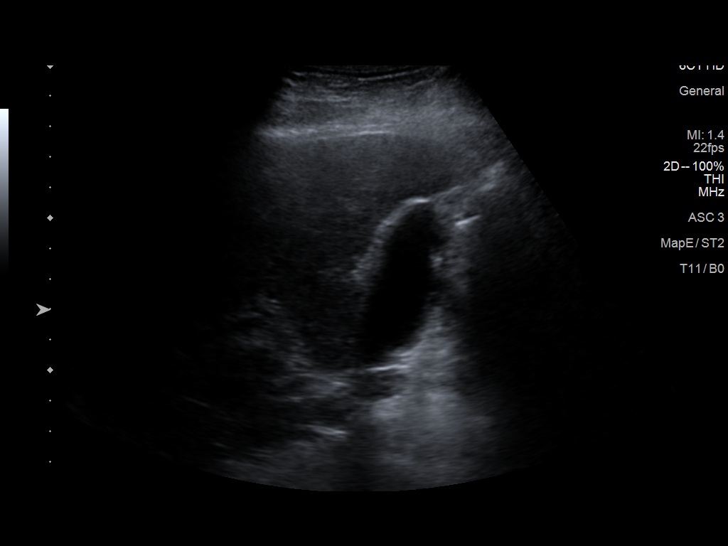
[im 4/44]
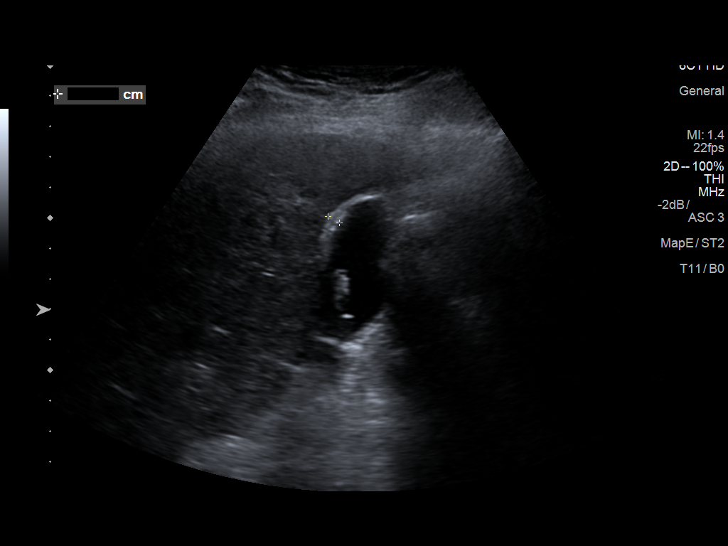
[im 8/44]
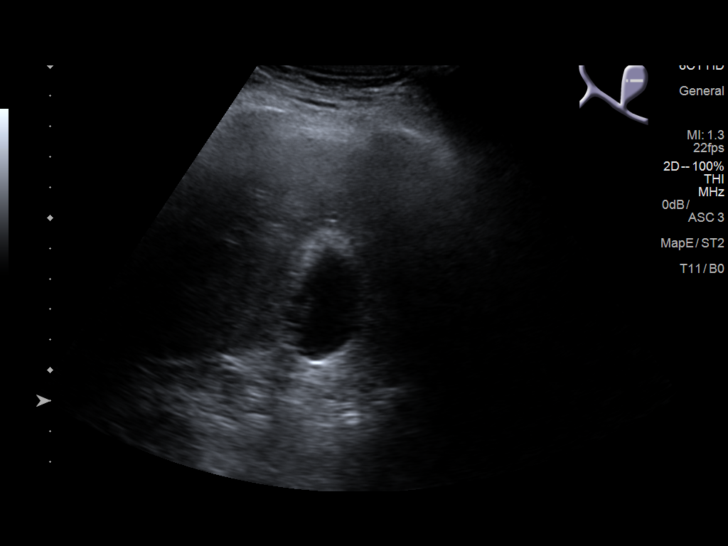
[im 11/44]
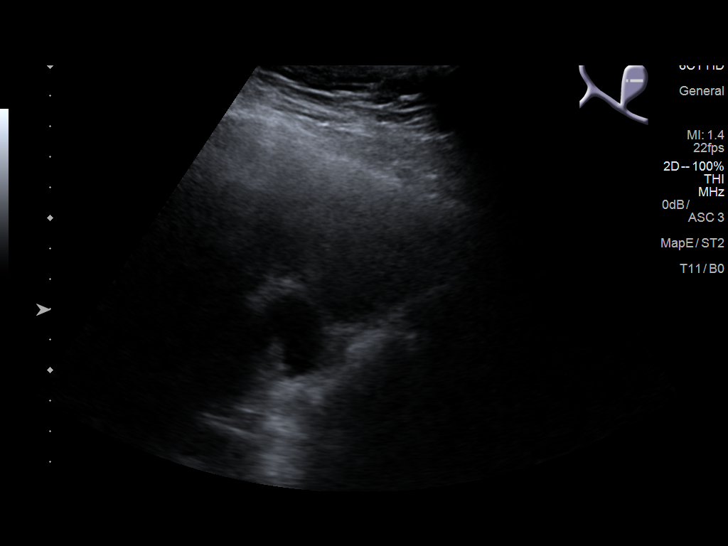
[im 15/44]
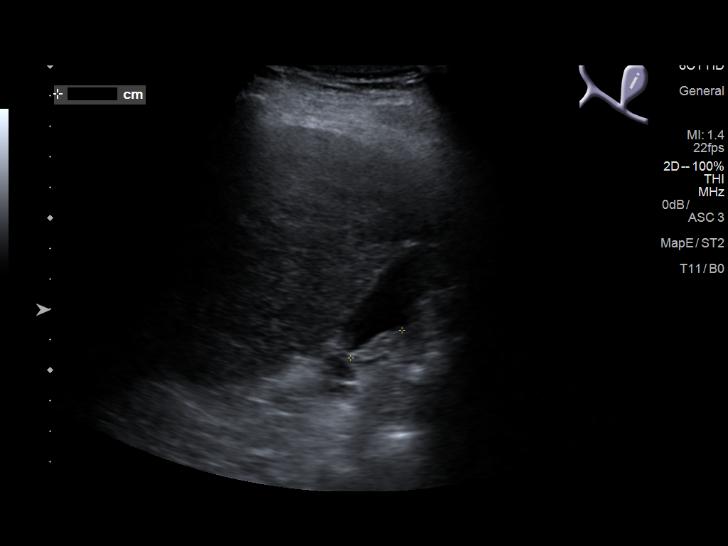
[im 17/44]
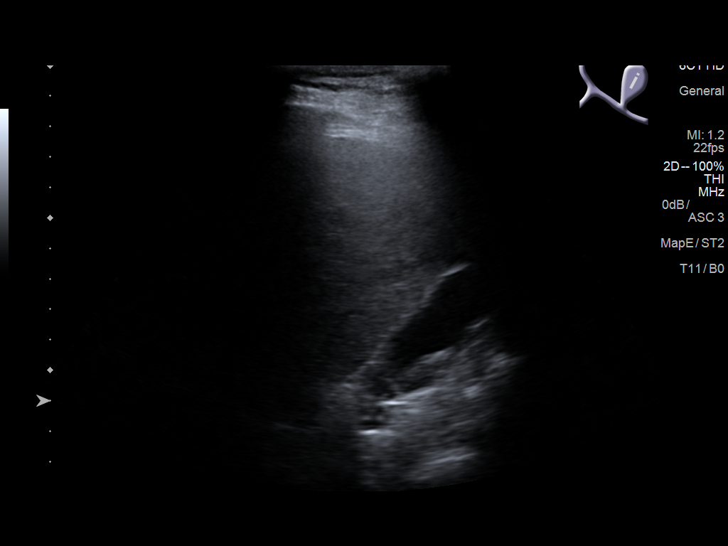
[im 20/44]
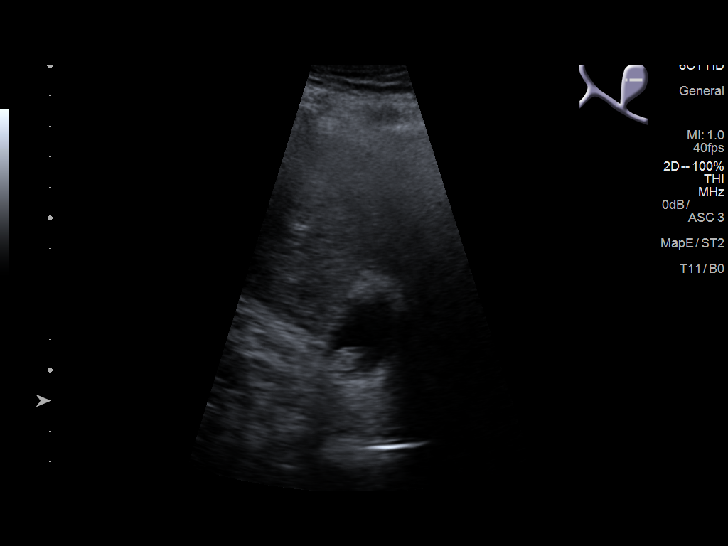
[im 24/44]
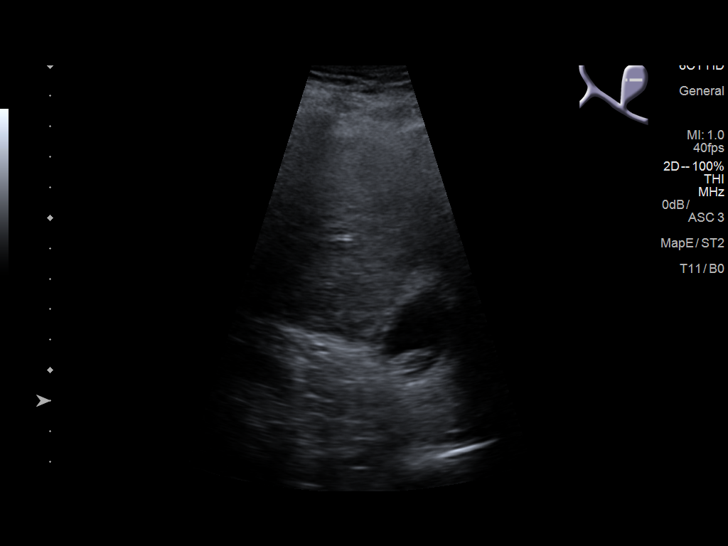
[im 27/44]
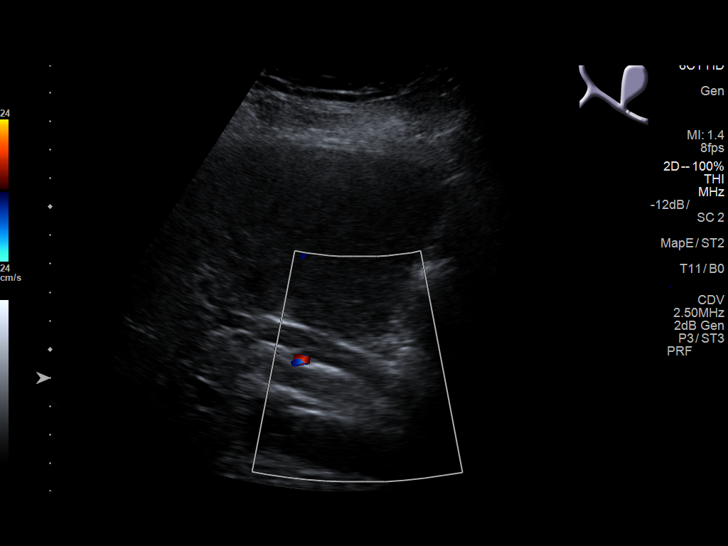
[im 29/44]
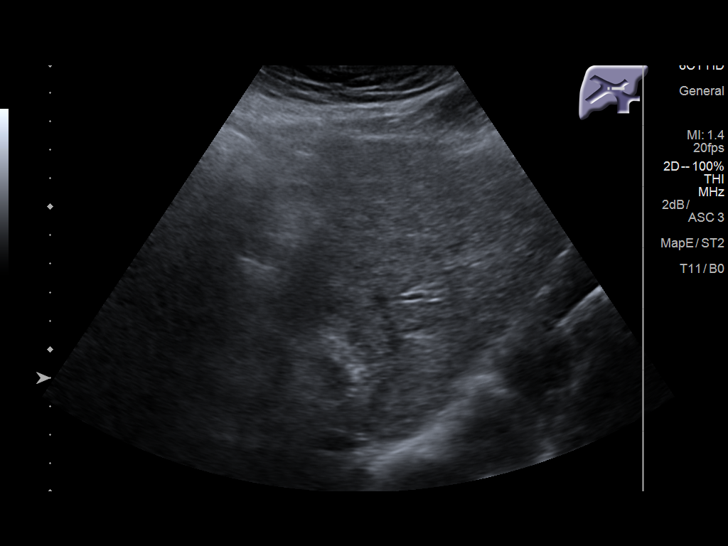
[im 33/44]
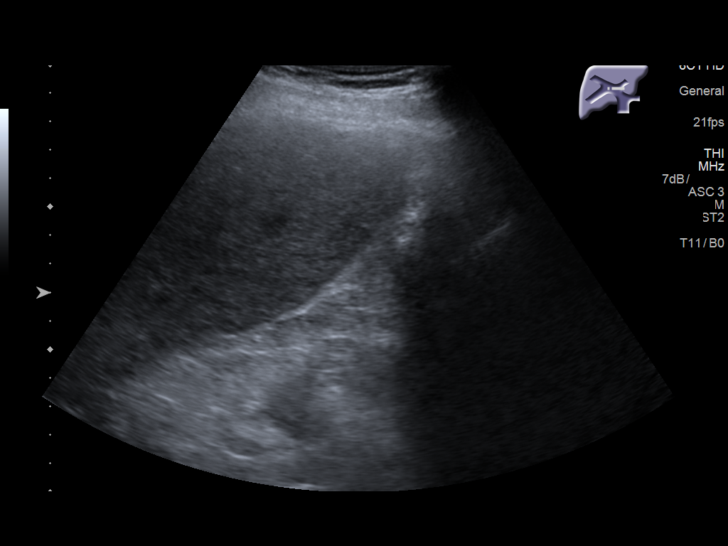
[im 36/44]
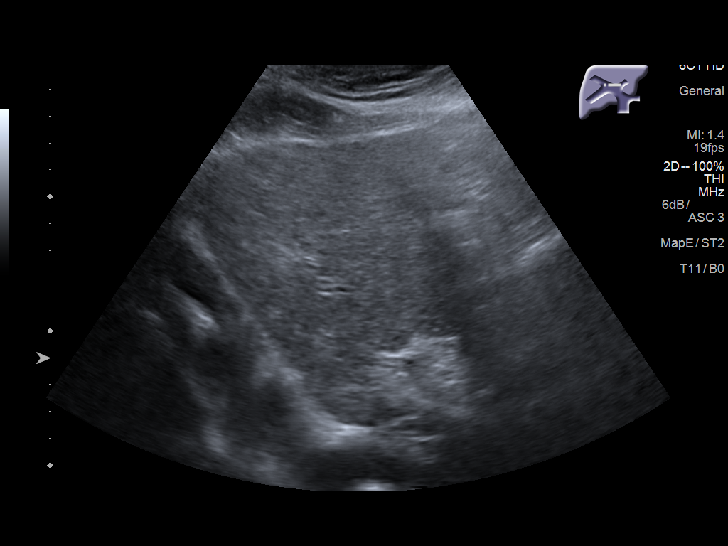
[im 40/44]
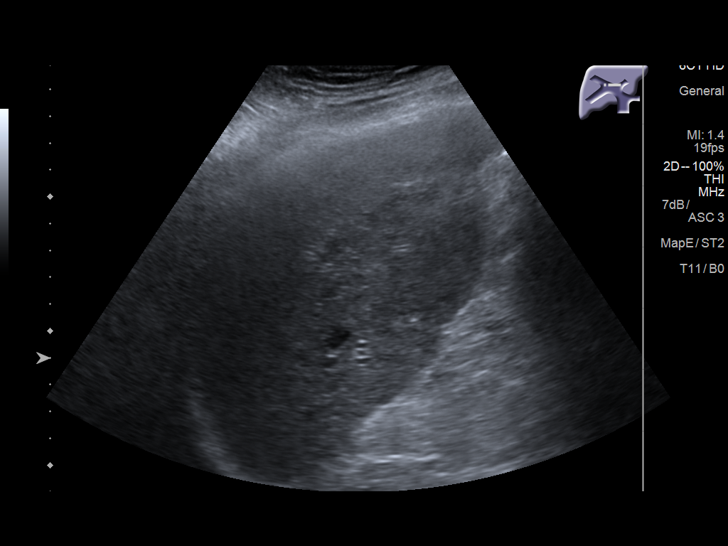
[im 44/44]
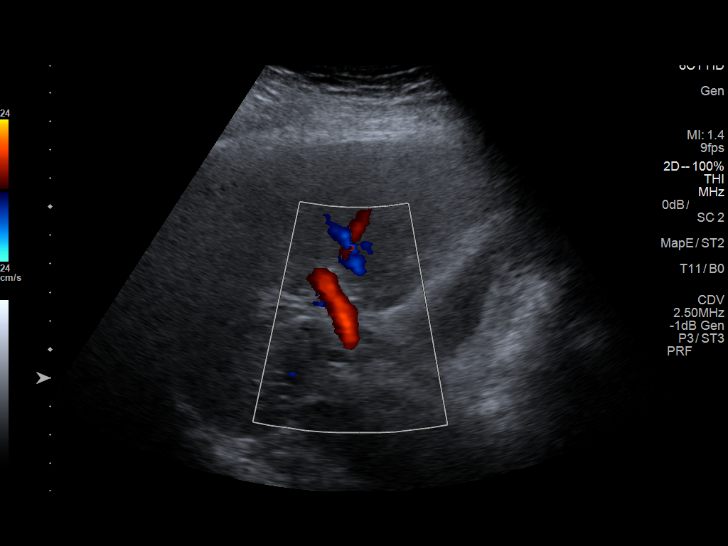

[14 of 25 positions shown; findings below may reference images not displayed]

FINDINGS: Gallbladder:

The gallbladder is adequately distended there is mild wall
thickening to 4.3 mm. There is no positive sonographic Murphy's
sign. There are echogenic mobile shadowing stones measuring up to
1.9 cm in diameter.

Common bile duct:

Diameter: 8.2 mm.  No intraluminal stones are observed.

Liver:

The hepatic echotexture is heterogeneous. The surface contour is
irregular. There is no focal mass or ductal dilation.
IMPRESSION: 1. Gallstones and mild gallbladder wall thickening possibly
reflecting cholecystitis. No pericholecystic fluid or positive
sonographic Murphy's sign.
2. Mild dilation of the common bile duct without evidence of common
duct stones.
3. Cirrhotic changes within the liver, stable.

## 2018-04-17 ENCOUNTER — Other Ambulatory Visit: Payer: Self-pay | Admitting: Internal Medicine

## 2018-04-17 DIAGNOSIS — D631 Anemia in chronic kidney disease: Secondary | ICD-10-CM | POA: Diagnosis not present

## 2018-04-17 DIAGNOSIS — N186 End stage renal disease: Secondary | ICD-10-CM | POA: Diagnosis not present

## 2018-04-17 DIAGNOSIS — D509 Iron deficiency anemia, unspecified: Secondary | ICD-10-CM | POA: Diagnosis not present

## 2018-04-17 DIAGNOSIS — N2581 Secondary hyperparathyroidism of renal origin: Secondary | ICD-10-CM | POA: Diagnosis not present

## 2018-04-17 DIAGNOSIS — Z992 Dependence on renal dialysis: Secondary | ICD-10-CM | POA: Diagnosis not present

## 2018-04-17 IMAGING — US US ABDOMEN LIMITED
1 series · 14 of 25 positions shown · non-contrast
Comparison: Right upper quadrant ultrasound performed 06/09/2016

CLINICAL DATA: Acute onset of generalized abdominal pain and
hyperbilirubinemia. Initial encounter.

EXAM:
US ABDOMEN LIMITED - RIGHT UPPER QUADRANT

[Series 1: us abdomen limited · 0.26mm/px · 14 of 43 slices shown]
[im 1/43]
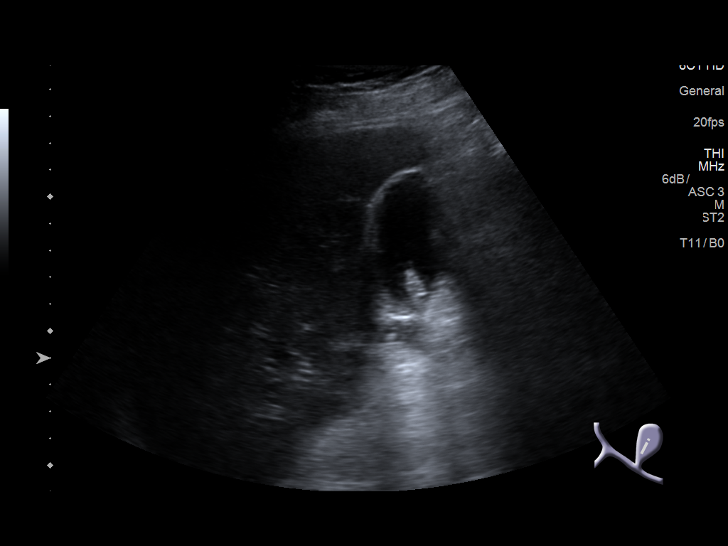
[im 4/43]
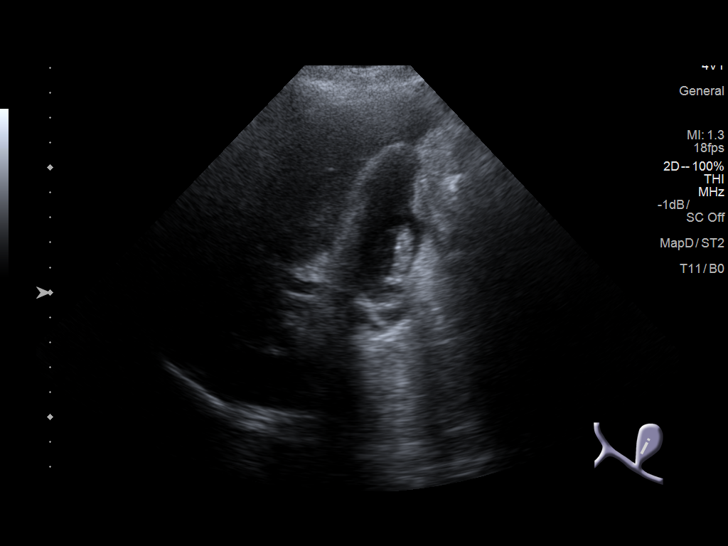
[im 8/43]
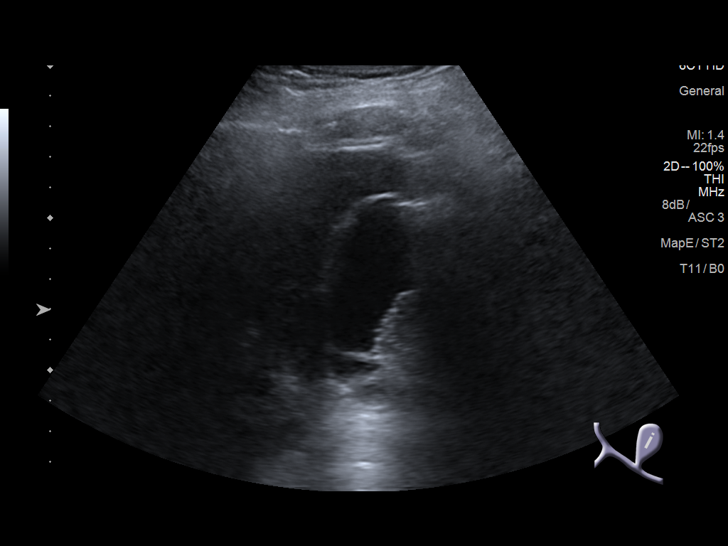
[im 11/43]
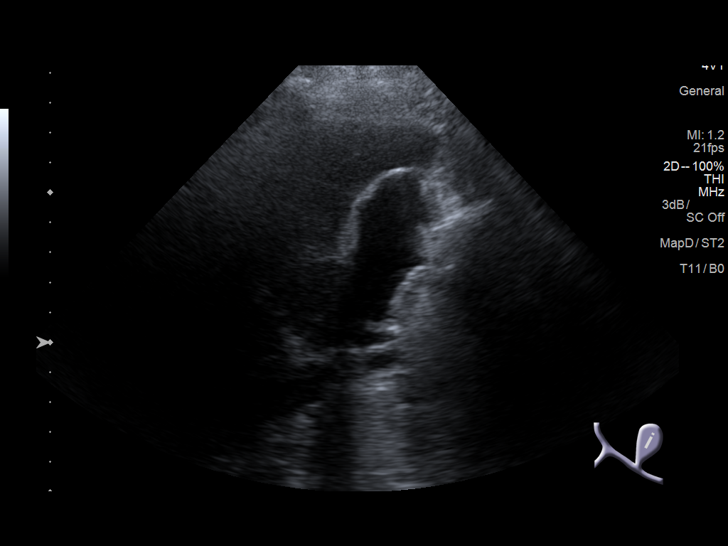
[im 15/43]
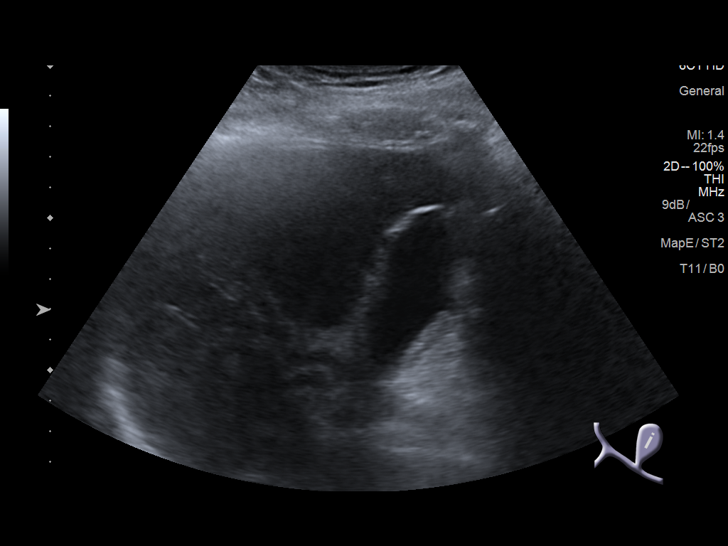
[im 16/43]
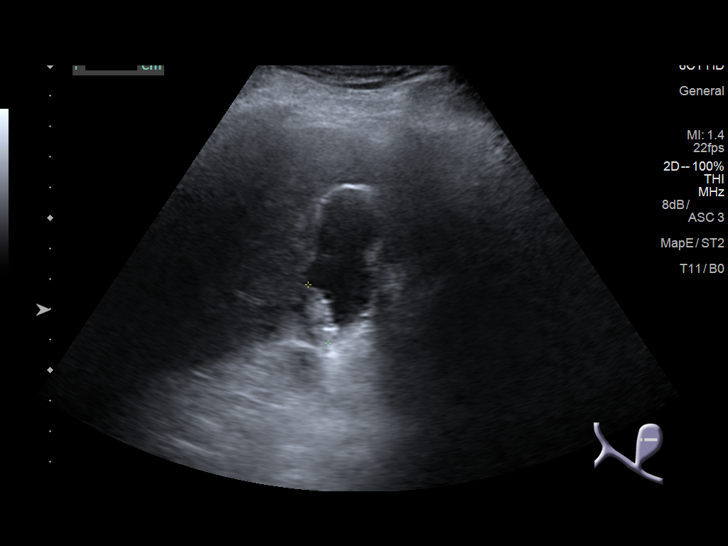
[im 20/43]
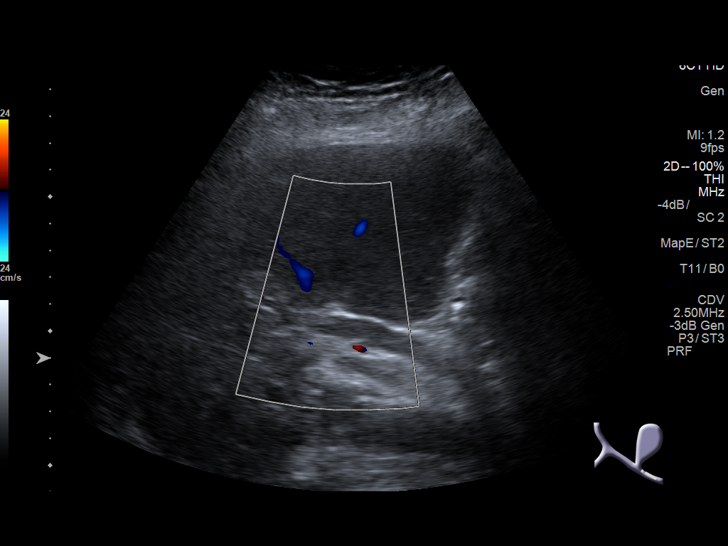
[im 23/43]
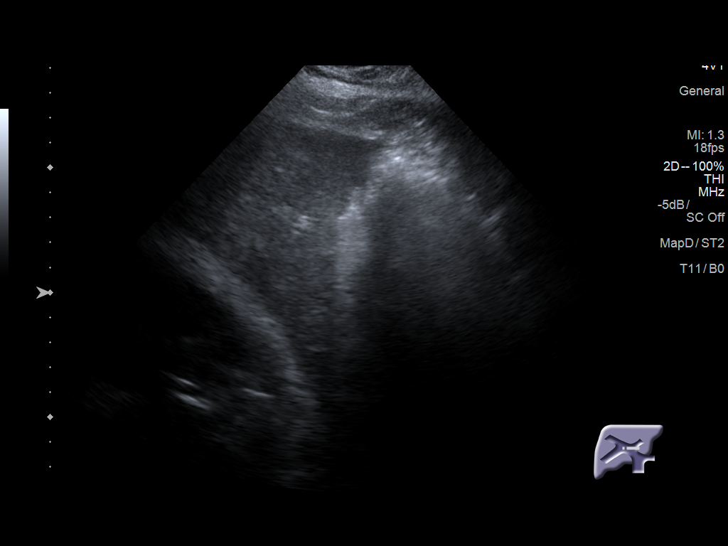
[im 27/43]
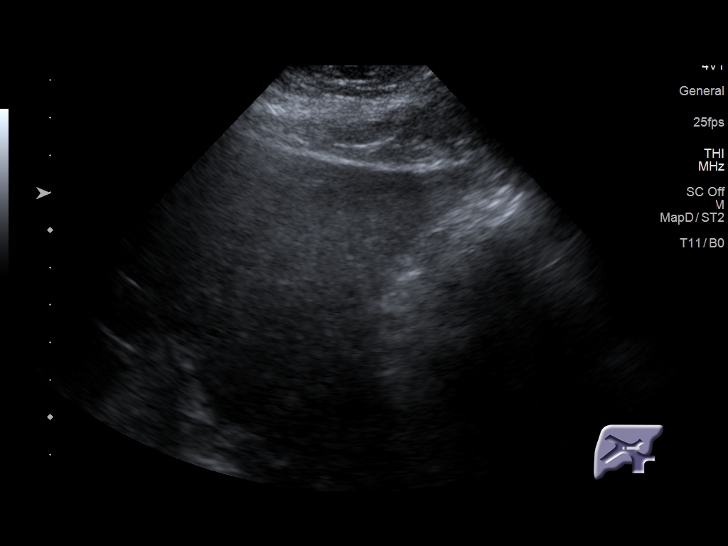
[im 29/43]
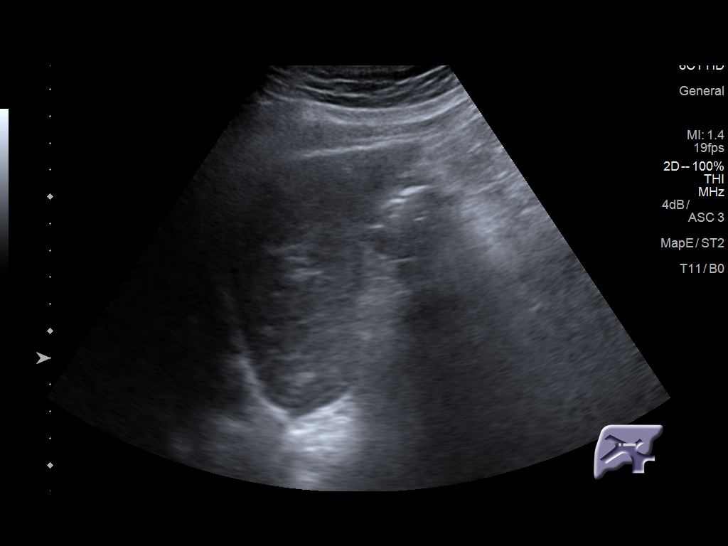
[im 32/43]
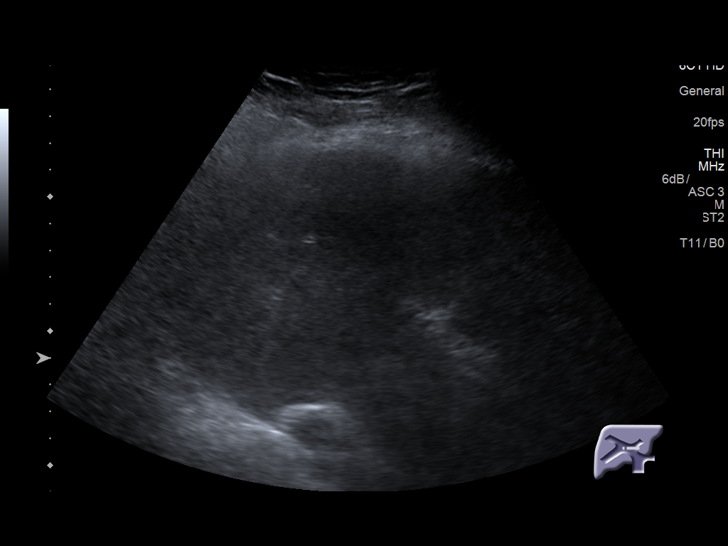
[im 36/43]
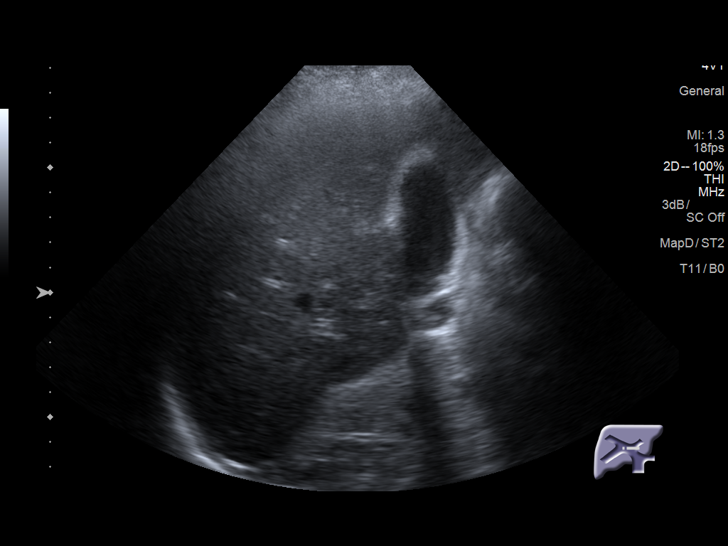
[im 39/43]
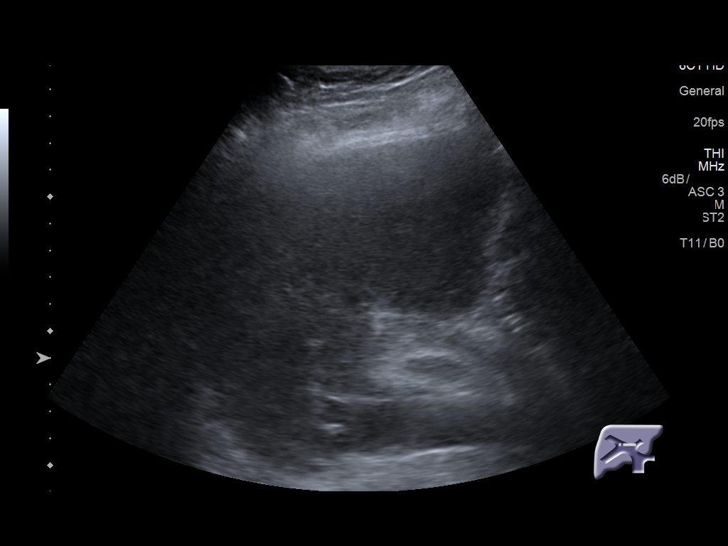
[im 43/43]
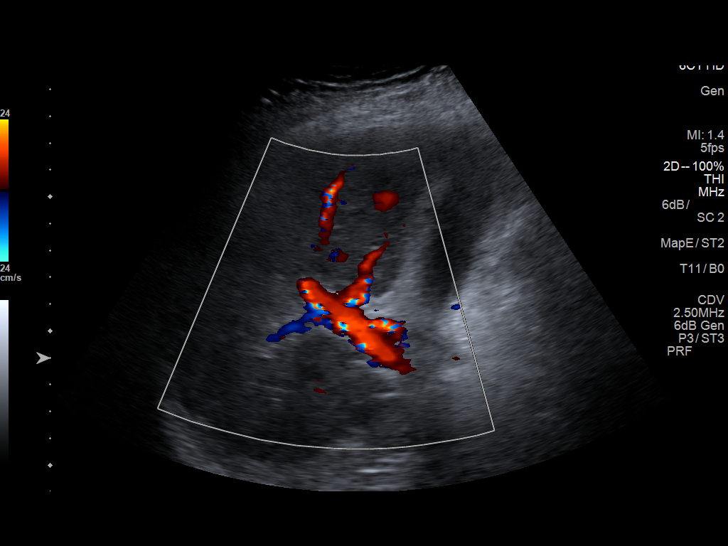

[14 of 25 positions shown; findings below may reference images not displayed]

FINDINGS: Gallbladder:

Stones within the gallbladder measure up to 2.0 cm in size. The
gallbladder wall is borderline prominent. No pericholecystic fluid
is seen. No sonographic Murphy sign noted by sonographer.

Common bile duct:

Diameter: 0.9 cm, dilated in appearance.

Liver:

No focal lesion identified. Mildly nodular contour, with mild
heterogeneity, raising concern for mild cirrhosis.
IMPRESSION: 1. Cholelithiasis. Borderline prominence of the gallbladder wall,
and dilatation of the common bile duct to 0.9 cm, raising concern
for distal obstruction. Would correlate with LFTs, and consider MRCP
or ERCP for further evaluation.
2. Findings suggest mild hepatic cirrhosis.

## 2018-04-18 ENCOUNTER — Inpatient Hospital Stay: Payer: Medicare Other | Attending: Oncology | Admitting: Oncology

## 2018-04-18 ENCOUNTER — Telehealth: Payer: Self-pay | Admitting: Oncology

## 2018-04-18 ENCOUNTER — Telehealth: Payer: Self-pay | Admitting: Cardiology

## 2018-04-18 ENCOUNTER — Ambulatory Visit (INDEPENDENT_AMBULATORY_CARE_PROVIDER_SITE_OTHER): Payer: Medicare Other | Admitting: *Deleted

## 2018-04-18 VITALS — BP 97/57 | HR 70 | Resp 17 | Ht 70.0 in | Wt 174.1 lb

## 2018-04-18 DIAGNOSIS — Z5181 Encounter for therapeutic drug level monitoring: Secondary | ICD-10-CM

## 2018-04-18 DIAGNOSIS — L89610 Pressure ulcer of right heel, unstageable: Secondary | ICD-10-CM | POA: Diagnosis not present

## 2018-04-18 DIAGNOSIS — L89890 Pressure ulcer of other site, unstageable: Secondary | ICD-10-CM | POA: Diagnosis not present

## 2018-04-18 DIAGNOSIS — Z7901 Long term (current) use of anticoagulants: Secondary | ICD-10-CM

## 2018-04-18 DIAGNOSIS — Z85048 Personal history of other malignant neoplasm of rectum, rectosigmoid junction, and anus: Secondary | ICD-10-CM | POA: Diagnosis not present

## 2018-04-18 DIAGNOSIS — E1122 Type 2 diabetes mellitus with diabetic chronic kidney disease: Secondary | ICD-10-CM | POA: Diagnosis not present

## 2018-04-18 DIAGNOSIS — L89153 Pressure ulcer of sacral region, stage 3: Secondary | ICD-10-CM | POA: Diagnosis not present

## 2018-04-18 DIAGNOSIS — L89620 Pressure ulcer of left heel, unstageable: Secondary | ICD-10-CM | POA: Diagnosis not present

## 2018-04-18 DIAGNOSIS — I482 Chronic atrial fibrillation, unspecified: Secondary | ICD-10-CM

## 2018-04-18 DIAGNOSIS — Z8546 Personal history of malignant neoplasm of prostate: Secondary | ICD-10-CM | POA: Insufficient documentation

## 2018-04-18 DIAGNOSIS — I2510 Atherosclerotic heart disease of native coronary artery without angina pectoris: Secondary | ICD-10-CM | POA: Diagnosis not present

## 2018-04-18 LAB — POCT INR: INR: 2.7

## 2018-04-18 NOTE — Telephone Encounter (Signed)
Appointments scheduled AVS/Calendar printed per 5/14 los °

## 2018-04-18 NOTE — Telephone Encounter (Signed)
Done.  See coumadin note. 

## 2018-04-18 NOTE — Progress Notes (Signed)
[name] OFFICE PROGRESS NOTE   Diagnosis: Rectal cancer  INTERVAL HISTORY:   [name] was last seen at the cancer center in February 2018.  He was admitted in August 2018 with Staphylococcus sepsis.  He had a prolonged hospital admission including a rehabilitation stay.  He developed a sacral decubitus ulcer that required placement of a wound VAC.  [name] is maintained on chronic antibiotic suppression.  He is now on hemodialysis. He reports the PA at the dialysis center noted an enlarged lymph node in his right axilla.  He is referred back to the oncology clinic. He has a good appetite.  He has a chronic right pleural effusion and is undergone repeat thoracentesis procedures with negative cytology.  Objective:  Vital signs in last 24 hours:  There were no vitals taken for this visit.    HEENT: Neck without mass Lymphatics: No cervical, supraclavicular, or inguinal nodes.  "Shotty "bilateral axillary nodes. Resp: Decreased breath sounds at the right lower chest, no respiratory distress Cardio: Regular rate and rhythm GI: Mildly distended, nontender, no hepatomegaly Vascular: Anasarca over the trunk, chronic stasis change at the lower leg bilaterally, right arm pulsatile dialysis access Skin: Dressing in place over a decubitus wound, lower portion of the perineal scar without evidence of recurrent tumor, multiple ecchymoses  Lab Results  Component Value Date   CEA1 1.49 01/11/2017    Lab Results  Component Value Date   INR 2.4 03/28/2018    Medications: I have reviewed the patient's current medications.   1. Stage III rectal cancer, diagnosed in June of 2008: Status post neoadjuvant infusional 5-FU and concurrent radiation. He underwent an APR 10/04/2007 with the pathology confirming stage III disease. He completed 8 cycles of adjuvant FOLFOX therapy on 03/12/2008. A restaging CT 05/26/2010 showed no evidence of metastatic disease. He underwent a  colonoscopy in October of 2012 with removal of a single pedunculated polyp-benign pathology 2. Prostate cancer: Status post radiation and 2 years of Lupron therapy per Dr. Terance Hart. 3. History of thrombocytopenia secondary to chemotherapy. The platelet count was normal on 05/02/12. 4. History of delayed nausea secondary to chemotherapy: Improved with Aloxi. 5. History of oxaliplatin neuropathy. 6. Ischemic cardiomyopathy followed by Dr. Rollene Fare. 7. History of bilateral axillary fullness. 8. Diabetes. 9. Status post Port-A-Cath removal. 10. Hospitalization with pneumonia October 2012. 11. Question small bilateral inguinal lymph nodes on exam 04/17/2013-? Small left inguinal node on exam 07/17/2013. 12. CT 04/05/2015 with a 2.7 cm perirectal nodule; CT 07/31/2015 with mild increase in size of asymmetric right perirectal soft tissue density.  CT 05/05/2016 with no change in the perirectal soft tissue density, no evidence of metastatic disease 13. End-stage renal disease on hemodialysis, placement of right arm fistula 09/16/2015. 14. Admission 05/10/2016 with subxiphoid discomfort-potentially related to gallbladder disease 15. Cirrhosis 16. History of mild thrombocytopenia-potentially related to cirrhosis 17. Staphylococcus sepsis August 2018    Disposition: [name] is in clinical remission from rectal cancer.  There are soft mobile small bilateral axillary lymph nodes on exam today.  These are likely benign.  He has multiple comorbid conditions and is followed closely by nephrology, cardiology, and infectious disease.  He would like to continue follow-up at the Cancer center.  [name] will return for an office visit in 1 year.  We are available to see him in the interim as needed.  15 minutes were spent with the patient today.  The majority of the time was used for counseling and coordination  of care.  [name], MD  04/18/2018  8:11 AM

## 2018-04-18 NOTE — Patient Instructions (Signed)
Continue coumadin 1 tablet daily.  Recheck in 4 week  Order given to [employer] [facility]

## 2018-04-18 NOTE — Telephone Encounter (Signed)
INR 2.7.

## 2018-04-19 DIAGNOSIS — D509 Iron deficiency anemia, unspecified: Secondary | ICD-10-CM | POA: Diagnosis not present

## 2018-04-19 DIAGNOSIS — L89153 Pressure ulcer of sacral region, stage 3: Secondary | ICD-10-CM | POA: Diagnosis not present

## 2018-04-19 DIAGNOSIS — I255 Ischemic cardiomyopathy: Secondary | ICD-10-CM | POA: Diagnosis not present

## 2018-04-19 DIAGNOSIS — E1122 Type 2 diabetes mellitus with diabetic chronic kidney disease: Secondary | ICD-10-CM | POA: Diagnosis not present

## 2018-04-19 DIAGNOSIS — D631 Anemia in chronic kidney disease: Secondary | ICD-10-CM | POA: Diagnosis not present

## 2018-04-19 DIAGNOSIS — Z992 Dependence on renal dialysis: Secondary | ICD-10-CM | POA: Diagnosis not present

## 2018-04-19 DIAGNOSIS — N186 End stage renal disease: Secondary | ICD-10-CM | POA: Diagnosis not present

## 2018-04-19 DIAGNOSIS — N2581 Secondary hyperparathyroidism of renal origin: Secondary | ICD-10-CM | POA: Diagnosis not present

## 2018-04-19 DIAGNOSIS — I2510 Atherosclerotic heart disease of native coronary artery without angina pectoris: Secondary | ICD-10-CM | POA: Diagnosis not present

## 2018-04-20 ENCOUNTER — Other Ambulatory Visit: Payer: Self-pay

## 2018-04-20 ENCOUNTER — Ambulatory Visit (INDEPENDENT_AMBULATORY_CARE_PROVIDER_SITE_OTHER): Payer: Medicare Other | Admitting: Vascular Surgery

## 2018-04-20 ENCOUNTER — Encounter: Payer: Self-pay | Admitting: Vascular Surgery

## 2018-04-20 ENCOUNTER — Other Ambulatory Visit: Payer: Self-pay | Admitting: *Deleted

## 2018-04-20 ENCOUNTER — Encounter: Payer: Self-pay | Admitting: *Deleted

## 2018-04-20 VITALS — BP 109/68 | HR 70 | Temp 97.0°F | Resp 16 | Ht 70.0 in | Wt 174.0 lb

## 2018-04-20 DIAGNOSIS — I255 Ischemic cardiomyopathy: Secondary | ICD-10-CM

## 2018-04-20 DIAGNOSIS — N186 End stage renal disease: Secondary | ICD-10-CM

## 2018-04-20 DIAGNOSIS — I2510 Atherosclerotic heart disease of native coronary artery without angina pectoris: Secondary | ICD-10-CM | POA: Diagnosis not present

## 2018-04-20 DIAGNOSIS — L89610 Pressure ulcer of right heel, unstageable: Secondary | ICD-10-CM | POA: Diagnosis not present

## 2018-04-20 DIAGNOSIS — Z992 Dependence on renal dialysis: Secondary | ICD-10-CM | POA: Diagnosis not present

## 2018-04-20 DIAGNOSIS — I120 Hypertensive chronic kidney disease with stage 5 chronic kidney disease or end stage renal disease: Secondary | ICD-10-CM | POA: Diagnosis not present

## 2018-04-20 DIAGNOSIS — L89153 Pressure ulcer of sacral region, stage 3: Secondary | ICD-10-CM | POA: Diagnosis not present

## 2018-04-20 DIAGNOSIS — E1122 Type 2 diabetes mellitus with diabetic chronic kidney disease: Secondary | ICD-10-CM | POA: Diagnosis not present

## 2018-04-20 NOTE — H&P (View-Only) (Signed)
Patient is a 62 year old male referred for evaluation for prolonged bleeding from an AV fistula.  The patient had a brachiocephalic AV fistula placed in 2016.  He states he has never had any interventions on this.  He states that he has been having prolonged bleeding.  He is on warfarin.  He is on home oxygen.  His dialysis day is Monday Wednesday Friday.  Right medical problems include cardiac arrhythmia status post AICD, coronary artery disease, cirrhosis, diabetes, hypertension, all of which are currently stable.  Review of systems: He denies shortness of breath.  He denies chest pain.  Past Medical History:  Diagnosis Date  . Adenocarcinoma of rectum ([place])    a. 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 AICD (automatic cardioverter/defibrillator) present 2002   BI V ICD  . Anemia   . CAD (coronary artery disease)    a. BMS [facility] LAD 2001 at [facility] b. PTCA/atherectomy ramus and BMS to LAD 2009  . CHF (congestive heart failure) ([place])   . Cholelithiasis 06/2015  . Chronic kidney disease, stage IV (severe) ([place])   . Chronic systolic heart failure ([place])   . Cirrhosis ([place])   . Colostomy in place [facility])   . Dizziness    a. chronic. Admission for this 07/18/2014  . DM type 2, uncontrolled, with renal complications ([place])   . Dysrhythmia   . Essential hypertension, benign   . GERD (gastroesophageal reflux disease)   . HCAP (healthcare-associated pneumonia) 07/21/2015  . Hematuria   . History of blood transfusion    "I've had 2 units so far this year" (09/27/2015)  . HLD (hyperlipidemia)   . Ischemic cardiomyopathy    EF 18% by nuclear study 2016, multiple myocardial infarctions in past    . Myocardial infarction ([place]) 2001  . Obesity   . Orthostatic hypotension   . OSA on CPAP   . Paroxysmal atrial fibrillation (HCC)    a. on amiodarone, digoxin and Eliquis  . PONV (postoperative nausea and vomiting)   . Presence of permanent cardiac pacemaker   . Prostate cancer ([place])    a. s/p seed implants with chemo and  radiation  . SAH (subarachnoid hemorrhage) ([place])    post-traumatic (fall) [facility] 12/2014  . TIA (transient ischemic attack)    Past Surgical History:  Procedure Laterality Date  . Abdominal and Perineal Resection of Rectum with Total Mesorectal Excision  10/04/2007  . AV FISTULA PLACEMENT Right 09/16/2015   Procedure: ARTERIOVENOUS (AV) FISTULA CREATION - BRACHIOCEPHALIC;  Surgeon: Elam Dutch, MD;  Location: [place];  Service: Vascular;  Laterality: Right;  . BI-VENTRICULAR IMPLANTABLE CARDIOVERTER DEFIBRILLATOR  (CRT-D)  2009  . CARDIAC CATHETERIZATION  08/2001; 2009   ; [name] 07/10/2013  . CARDIAC DEFIBRILLATOR PLACEMENT  2002  . CARDIAC DEFIBRILLATOR PLACEMENT  2009   Upgraded to a BiV ICD  . COLONOSCOPY  09/14/2011   Dr. Gala Romney: via colostomy, Single pedunculated benign inflammatory polyp. Due for surveillance Oct 2015  . COLONOSCOPY N/A 07/02/2014   Procedure: COLONOSCOPY;  Surgeon: Daneil Dolin, MD;  [facility];  Service: Endoscopy;  Laterality: N/A;  7:30 / COLONOSCOPY THRU COLOSTOMY  . COLONOSCOPY N/A 12/11/2014   Dr. Gala Romney via colostomy. Normal. Repeat in 2021.   Marland Kitchen COLONOSCOPY N/A 08/24/2015   Dr. Havery Moros: diminutive polyp not removed, stoma site of bleeding  . COLOSTOMY  2008  . CORONARY ANGIOPLASTY WITH STENT PLACEMENT  2001; ~ 2006   "1 + 1"   . ELECTROPHYSIOLOGIC STUDY N/A 08/28/2015   Procedure: AV  Node Ablation;  Surgeon: Will Meredith Leeds, MD;  Location: [place] CV LAB;  Service: Cardiovascular;  Laterality: N/A;  . EP IMPLANTABLE DEVICE N/A 04/10/2015   Procedure: Ppm/Biv Ppm Generator Changeout;  Surgeon: Evans Lance, MD;  Location: [place] INVASIVE CV LAB CUPID;  Service: Cardiovascular;  Laterality: N/A;  . ERCP N/A 06/11/2016   Procedure: ENDOSCOPIC RETROGRADE CHOLANGIOPANCREATOGRAPHY (ERCP);  Surgeon: Teena Irani, MD;  Location: [facility] ENDOSCOPY;  Service: Endoscopy;  Laterality: N/A;  . ERCP N/A 01/26/2017   Procedure: ENDOSCOPIC RETROGRADE  CHOLANGIOPANCREATOGRAPHY (ERCP);  Surgeon: Teena Irani, MD;  Location: [facility] ENDOSCOPY;  Service: Endoscopy;  Laterality: N/A;  . ERCP  06/2016   Dr. Amedeo Plenty: duodenal fistula, dilated bile duct but no obvious stones, s/p sphincterotomy and stent placement  . ERCP  01/2017   Dr. Amedeo Plenty: CBD stones, s/p sphincterotomy, balloon extraction, stent removal  . ESOPHAGOGASTRODUODENOSCOPY N/A 07/02/2014   Procedure: ESOPHAGOGASTRODUODENOSCOPY (EGD);  Surgeon: Daneil Dolin, MD;  [facility];  Service: Endoscopy;  Laterality: N/A;  7:30  . ESOPHAGOGASTRODUODENOSCOPY N/A 12/11/2014   [id number] EGD  . ESOPHAGOGASTRODUODENOSCOPY (EGD) WITH PROPOFOL N/A 04/18/2017   Procedure: ESOPHAGOGASTRODUODENOSCOPY (EGD) WITH PROPOFOL;  Surgeon: Daneil Dolin, MD;  [facility];  Service: Endoscopy;  Laterality: N/A;  2:30pm  . GASTROINTESTINAL STENT REMOVAL N/A 01/26/2017   Procedure: GASTROINTESTINAL STENT REMOVAL;  Surgeon: Teena Irani, MD;  Location: [place];  Service: Endoscopy;  Laterality: N/A;  . GIVENS CAPSULE STUDY N/A 07/23/2015   Dr. Michail Sermon: small non-bleeding AVM, mild gastritis  . IR FLUORO GUIDE CV LINE RIGHT  07/27/2017  . IR REMOVAL TUN CV CATH W/O FL  08/12/2017  . IR THORACENTESIS ASP PLEURAL SPACE W/IMG GUIDE  11/07/2017  . IR US GUIDE VASC ACCESS RIGHT  07/27/2017  . LEFT HEART CATHETERIZATION WITH CORONARY ANGIOGRAM N/A 07/13/2013   Procedure: LEFT HEART CATHETERIZATION WITH CORONARY ANGIOGRAM;  Surgeon: Lorretta Harp, MD;  Location: [facility] CATH LAB;  Service: Cardiovascular;  Laterality: N/A;  . Venia Minks DILATION N/A 07/02/2014   Procedure: Venia Minks DILATION;  Surgeon: Daneil Dolin, MD;  [facility];  Service: Endoscopy;  Laterality: N/A;  7:30  . MALONEY DILATION N/A 04/18/2017   Procedure: Venia Minks DILATION;  Surgeon: Daneil Dolin, MD;  [facility];  Service: Endoscopy;  Laterality: N/A;  . PORTACATH PLACEMENT  06/2007   "removed ~ 1 yr later"  . RIGHT  HEART CATHETERIZATION N/A 02/24/2015   Procedure: RIGHT HEART CATH;  Surgeon: Jolaine Artist, MD;  Location: [facility] CATH LAB;  Service: Cardiovascular;  Laterality: N/A;  . SAVORY DILATION N/A 07/02/2014   Procedure: SAVORY DILATION;  Surgeon: Daneil Dolin, MD;  [facility];  Service: Endoscopy;  Laterality: N/A;  7:30   Physical exam:  Vitals:   04/20/18 0934  BP: 109/68  Pulse: 70  Resp: 16  Temp: (!) 97 F (36.1 C)  TempSrc: Oral  SpO2: 98%  Weight: 174 lb (78.9 kg)  Height: 5\' 10"  (1.778 m)    Extremities: Pulsatile fistula right upper extremity which is pulsatile all the way to the level of the shoulder.  Assessment: Pulsatile AV fistula with history of prolonged bleeding.  Plan: The patient will be scheduled for a fistulogram by my partner Dr. Bridgett Larsson Apr 27, 2018.  Risk benefits possible complications and procedure details including but not limited to bleeding infection vessel injury were discussed with the patient.  I also discussed with him he may have some narrowing  of the outflow vein.  Dr. Bridgett Larsson will proceed with an intervention if necessary at the time of his procedure.  We will stop his warfarin a few days prior.  Ruta Hinds, MD [facility] [place] Office: [phone] Pager: [phone]

## 2018-04-20 NOTE — H&P (View-Only) (Signed)
Patient is a 62 year old male referred for evaluation for prolonged bleeding from an AV fistula.  The patient had a brachiocephalic AV fistula placed in 2016.  He states he has never had any interventions on this.  He states that he has been having prolonged bleeding.  He is on warfarin.  He is on home oxygen.  His dialysis day is Monday Wednesday Friday.  Right medical problems include cardiac arrhythmia status post AICD, coronary artery disease, cirrhosis, diabetes, hypertension, all of which are currently stable.  Review of systems: He denies shortness of breath.  He denies chest pain.  Past Medical History:  Diagnosis Date  . Adenocarcinoma of rectum ([place])    a. 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 AICD (automatic cardioverter/defibrillator) present 2002   BI V ICD  . Anemia   . CAD (coronary artery disease)    a. BMS to LAD 2001 at [facility] b. PTCA/atherectomy ramus and BMS to LAD 2009  . CHF (congestive heart failure) ([place])   . Cholelithiasis 06/2015  . Chronic kidney disease, stage IV (severe) ([place])   . Chronic systolic heart failure ([place])   . Cirrhosis ([place])   . Colostomy in place [facility])   . Dizziness    a. chronic. Admission for this 07/18/2014  . DM type 2, uncontrolled, with renal complications ([place])   . Dysrhythmia   . Essential hypertension, benign   . GERD (gastroesophageal reflux disease)   . HCAP (healthcare-associated pneumonia) 07/21/2015  . Hematuria   . History of blood transfusion    "I've had 2 units so far this year" (09/27/2015)  . HLD (hyperlipidemia)   . Ischemic cardiomyopathy    EF 18% by nuclear study 2016, multiple myocardial infarctions in past    . Myocardial infarction ([place]) 2001  . Obesity   . Orthostatic hypotension   . OSA on CPAP   . Paroxysmal atrial fibrillation (HCC)    a. on amiodarone, digoxin and Eliquis  . PONV (postoperative nausea and vomiting)   . Presence of permanent cardiac pacemaker   . Prostate cancer ([place])    a. s/p seed implants with chemo and  radiation  . SAH (subarachnoid hemorrhage) ([place])    post-traumatic (fall) [facility] 12/2014  . TIA (transient ischemic attack)    Past Surgical History:  Procedure Laterality Date  . Abdominal and Perineal Resection of Rectum with Total Mesorectal Excision  10/04/2007  . AV FISTULA PLACEMENT Right 09/16/2015   Procedure: ARTERIOVENOUS (AV) FISTULA CREATION - BRACHIOCEPHALIC;  Surgeon: Elam Dutch, MD;  Location: [place];  Service: Vascular;  Laterality: Right;  . BI-VENTRICULAR IMPLANTABLE CARDIOVERTER DEFIBRILLATOR  (CRT-D)  2009  . CARDIAC CATHETERIZATION  08/2001; 2009   ; [name] 07/10/2013  . CARDIAC DEFIBRILLATOR PLACEMENT  2002  . CARDIAC DEFIBRILLATOR PLACEMENT  2009   Upgraded to a BiV ICD  . COLONOSCOPY  09/14/2011   Dr. Gala Romney: via colostomy, Single pedunculated benign inflammatory polyp. Due for surveillance Oct 2015  . COLONOSCOPY N/A 07/02/2014   Procedure: COLONOSCOPY;  Surgeon: Daneil Dolin, MD;  [facility];  Service: Endoscopy;  Laterality: N/A;  7:30 / COLONOSCOPY THRU COLOSTOMY  . COLONOSCOPY N/A 12/11/2014   Dr. Gala Romney via colostomy. Normal. Repeat in 2021.   Marland Kitchen COLONOSCOPY N/A 08/24/2015   Dr. Havery Moros: diminutive polyp not removed, stoma site of bleeding  . COLOSTOMY  2008  . CORONARY ANGIOPLASTY WITH STENT PLACEMENT  2001; ~ 2006   "1 + 1"   . ELECTROPHYSIOLOGIC STUDY N/A 08/28/2015   Procedure: AV  Node Ablation;  Surgeon: Will Meredith Leeds, MD;  Location: [place] CV LAB;  Service: Cardiovascular;  Laterality: N/A;  . EP IMPLANTABLE DEVICE N/A 04/10/2015   Procedure: Ppm/Biv Ppm Generator Changeout;  Surgeon: Evans Lance, MD;  Location: [place] INVASIVE CV LAB CUPID;  Service: Cardiovascular;  Laterality: N/A;  . ERCP N/A 06/11/2016   Procedure: ENDOSCOPIC RETROGRADE CHOLANGIOPANCREATOGRAPHY (ERCP);  Surgeon: Teena Irani, MD;  Location: [facility] ENDOSCOPY;  Service: Endoscopy;  Laterality: N/A;  . ERCP N/A 01/26/2017   Procedure: ENDOSCOPIC RETROGRADE  CHOLANGIOPANCREATOGRAPHY (ERCP);  Surgeon: Teena Irani, MD;  Location: [facility] ENDOSCOPY;  Service: Endoscopy;  Laterality: N/A;  . ERCP  06/2016   Dr. Amedeo Plenty: duodenal fistula, dilated bile duct but no obvious stones, s/p sphincterotomy and stent placement  . ERCP  01/2017   Dr. Amedeo Plenty: CBD stones, s/p sphincterotomy, balloon extraction, stent removal  . ESOPHAGOGASTRODUODENOSCOPY N/A 07/02/2014   Procedure: ESOPHAGOGASTRODUODENOSCOPY (EGD);  Surgeon: Daneil Dolin, MD;  [facility];  Service: Endoscopy;  Laterality: N/A;  7:30  . ESOPHAGOGASTRODUODENOSCOPY N/A 12/11/2014   [id number] EGD  . ESOPHAGOGASTRODUODENOSCOPY (EGD) WITH PROPOFOL N/A 04/18/2017   Procedure: ESOPHAGOGASTRODUODENOSCOPY (EGD) WITH PROPOFOL;  Surgeon: Daneil Dolin, MD;  [facility];  Service: Endoscopy;  Laterality: N/A;  2:30pm  . GASTROINTESTINAL STENT REMOVAL N/A 01/26/2017   Procedure: GASTROINTESTINAL STENT REMOVAL;  Surgeon: Teena Irani, MD;  Location: [place];  Service: Endoscopy;  Laterality: N/A;  . GIVENS CAPSULE STUDY N/A 07/23/2015   Dr. Michail Sermon: small non-bleeding AVM, mild gastritis  . IR FLUORO GUIDE CV LINE RIGHT  07/27/2017  . IR REMOVAL TUN CV CATH W/O FL  08/12/2017  . IR THORACENTESIS ASP PLEURAL SPACE W/IMG GUIDE  11/07/2017  . IR US GUIDE VASC ACCESS RIGHT  07/27/2017  . LEFT HEART CATHETERIZATION WITH CORONARY ANGIOGRAM N/A 07/13/2013   Procedure: LEFT HEART CATHETERIZATION WITH CORONARY ANGIOGRAM;  Surgeon: Lorretta Harp, MD;  Location: [facility] CATH LAB;  Service: Cardiovascular;  Laterality: N/A;  . Venia Minks DILATION N/A 07/02/2014   Procedure: Venia Minks DILATION;  Surgeon: Daneil Dolin, MD;  [facility];  Service: Endoscopy;  Laterality: N/A;  7:30  . MALONEY DILATION N/A 04/18/2017   Procedure: Venia Minks DILATION;  Surgeon: Daneil Dolin, MD;  [facility];  Service: Endoscopy;  Laterality: N/A;  . PORTACATH PLACEMENT  06/2007   "removed ~ 1 yr later"  . RIGHT  HEART CATHETERIZATION N/A 02/24/2015   Procedure: RIGHT HEART CATH;  Surgeon: Jolaine Artist, MD;  Location: [facility] CATH LAB;  Service: Cardiovascular;  Laterality: N/A;  . SAVORY DILATION N/A 07/02/2014   Procedure: SAVORY DILATION;  Surgeon: Daneil Dolin, MD;  [facility];  Service: Endoscopy;  Laterality: N/A;  7:30   Physical exam:  Vitals:   04/20/18 0934  BP: 109/68  Pulse: 70  Resp: 16  Temp: (!) 97 F (36.1 C)  TempSrc: Oral  SpO2: 98%  Weight: 174 lb (78.9 kg)  Height: 5\' 10"  (1.778 m)    Extremities: Pulsatile fistula right upper extremity which is pulsatile all the way to the level of the shoulder.  Assessment: Pulsatile AV fistula with history of prolonged bleeding.  Plan: The patient will be scheduled for a fistulogram by my partner Dr. Bridgett Larsson Apr 27, 2018.  Risk benefits possible complications and procedure details including but not limited to bleeding infection vessel injury were discussed with the patient.  I also discussed with him he may have some narrowing  of the outflow vein.  Dr. Bridgett Larsson will proceed with an intervention if necessary at the time of his procedure.  We will stop his warfarin a few days prior.  Ruta Hinds, MD [facility] [place] Office: [phone] Pager: [phone]

## 2018-04-20 NOTE — Progress Notes (Signed)
Patient is a 62 year old male referred for evaluation for prolonged bleeding from an AV fistula.  The patient had a brachiocephalic AV fistula placed in 2016.  He states he has never had any interventions on this.  He states that he has been having prolonged bleeding.  He is on warfarin.  He is on home oxygen.  His dialysis day is Monday Wednesday Friday.  Right medical problems include cardiac arrhythmia status post AICD, coronary artery disease, cirrhosis, diabetes, hypertension, all of which are currently stable.  Review of systems: He denies shortness of breath.  He denies chest pain.  Past Medical History:  Diagnosis Date  . Adenocarcinoma of rectum ([place])    a. 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 AICD (automatic cardioverter/defibrillator) present 2002   BI V ICD  . Anemia   . CAD (coronary artery disease)    a. BMS to LAD 2001 at [facility] b. PTCA/atherectomy ramus and BMS to LAD 2009  . CHF (congestive heart failure) ([place])   . Cholelithiasis 06/2015  . Chronic kidney disease, stage IV (severe) ([place])   . Chronic systolic heart failure ([place])   . Cirrhosis ([place])   . Colostomy in place [facility])   . Dizziness    a. chronic. Admission for this 07/18/2014  . DM type 2, uncontrolled, with renal complications ([place])   . Dysrhythmia   . Essential hypertension, benign   . GERD (gastroesophageal reflux disease)   . HCAP (healthcare-associated pneumonia) 07/21/2015  . Hematuria   . History of blood transfusion    "I've had 2 units so far this year" (09/27/2015)  . HLD (hyperlipidemia)   . Ischemic cardiomyopathy    EF 18% by nuclear study 2016, multiple myocardial infarctions in past    . Myocardial infarction ([place]) 2001  . Obesity   . Orthostatic hypotension   . OSA on CPAP   . Paroxysmal atrial fibrillation ([facility])    a. on amiodarone, digoxin and Eliquis  . PONV (postoperative nausea and vomiting)   . Presence of permanent cardiac pacemaker   . Prostate cancer ([place])    a. s/p seed implants with chemo and  radiation  . SAH (subarachnoid hemorrhage) ([place])    post-traumatic (fall) [facility] 12/2014  . TIA (transient ischemic attack)    Past Surgical History:  Procedure Laterality Date  . Abdominal and Perineal Resection of Rectum with Total Mesorectal Excision  10/04/2007  . AV FISTULA PLACEMENT Right 09/16/2015   Procedure: ARTERIOVENOUS (AV) FISTULA CREATION - BRACHIOCEPHALIC;  Surgeon: Elam Dutch, MD;  Location: [place];  Service: Vascular;  Laterality: Right;  . BI-VENTRICULAR IMPLANTABLE CARDIOVERTER DEFIBRILLATOR  (CRT-D)  2009  . CARDIAC CATHETERIZATION  08/2001; 2009   ; [name] 07/10/2013  . CARDIAC DEFIBRILLATOR PLACEMENT  2002  . CARDIAC DEFIBRILLATOR PLACEMENT  2009   Upgraded to a BiV ICD  . COLONOSCOPY  09/14/2011   Dr. Gala Romney: via colostomy, Single pedunculated benign inflammatory polyp. Due for surveillance Oct 2015  . COLONOSCOPY N/A 07/02/2014   Procedure: COLONOSCOPY;  Surgeon: Daneil Dolin, MD;  [facility];  Service: Endoscopy;  Laterality: N/A;  7:30 / COLONOSCOPY THRU COLOSTOMY  . COLONOSCOPY N/A 12/11/2014   Dr. Gala Romney via colostomy. Normal. Repeat in 2021.   Marland Kitchen COLONOSCOPY N/A 08/24/2015   Dr. Havery Moros: diminutive polyp not removed, stoma site of bleeding  . COLOSTOMY  2008  . CORONARY ANGIOPLASTY WITH STENT PLACEMENT  2001; ~ 2006   "1 + 1"   . ELECTROPHYSIOLOGIC STUDY N/A 08/28/2015   Procedure: AV  Node Ablation;  Surgeon: Will Meredith Leeds, MD;  Location: [place] CV LAB;  Service: Cardiovascular;  Laterality: N/A;  . EP IMPLANTABLE DEVICE N/A 04/10/2015   Procedure: Ppm/Biv Ppm Generator Changeout;  Surgeon: Evans Lance, MD;  Location: [place] INVASIVE CV LAB CUPID;  Service: Cardiovascular;  Laterality: N/A;  . ERCP N/A 06/11/2016   Procedure: ENDOSCOPIC RETROGRADE CHOLANGIOPANCREATOGRAPHY (ERCP);  Surgeon: Teena Irani, MD;  Location: [facility] ENDOSCOPY;  Service: Endoscopy;  Laterality: N/A;  . ERCP N/A 01/26/2017   Procedure: ENDOSCOPIC RETROGRADE  CHOLANGIOPANCREATOGRAPHY (ERCP);  Surgeon: Teena Irani, MD;  Location: [facility] ENDOSCOPY;  Service: Endoscopy;  Laterality: N/A;  . ERCP  06/2016   Dr. Amedeo Plenty: duodenal fistula, dilated bile duct but no obvious stones, s/p sphincterotomy and stent placement  . ERCP  01/2017   Dr. Amedeo Plenty: CBD stones, s/p sphincterotomy, balloon extraction, stent removal  . ESOPHAGOGASTRODUODENOSCOPY N/A 07/02/2014   Procedure: ESOPHAGOGASTRODUODENOSCOPY (EGD);  Surgeon: Daneil Dolin, MD;  [facility];  Service: Endoscopy;  Laterality: N/A;  7:30  . ESOPHAGOGASTRODUODENOSCOPY N/A 12/11/2014   EXB:[id number] EGD  . ESOPHAGOGASTRODUODENOSCOPY (EGD) WITH PROPOFOL N/A 04/18/2017   Procedure: ESOPHAGOGASTRODUODENOSCOPY (EGD) WITH PROPOFOL;  Surgeon: Daneil Dolin, MD;  [facility];  Service: Endoscopy;  Laterality: N/A;  2:30pm  . GASTROINTESTINAL STENT REMOVAL N/A 01/26/2017   Procedure: GASTROINTESTINAL STENT REMOVAL;  Surgeon: Teena Irani, MD;  Location: [place];  Service: Endoscopy;  Laterality: N/A;  . GIVENS CAPSULE STUDY N/A 07/23/2015   Dr. Michail Sermon: small non-bleeding AVM, mild gastritis  . IR FLUORO GUIDE CV LINE RIGHT  07/27/2017  . IR REMOVAL TUN CV CATH W/O FL  08/12/2017  . IR THORACENTESIS ASP PLEURAL SPACE W/IMG GUIDE  11/07/2017  . IR US GUIDE VASC ACCESS RIGHT  07/27/2017  . LEFT HEART CATHETERIZATION WITH CORONARY ANGIOGRAM N/A 07/13/2013   Procedure: LEFT HEART CATHETERIZATION WITH CORONARY ANGIOGRAM;  Surgeon: Lorretta Harp, MD;  Location: [facility] CATH LAB;  Service: Cardiovascular;  Laterality: N/A;  . Venia Minks DILATION N/A 07/02/2014   Procedure: Venia Minks DILATION;  Surgeon: Daneil Dolin, MD;  [facility];  Service: Endoscopy;  Laterality: N/A;  7:30  . MALONEY DILATION N/A 04/18/2017   Procedure: Venia Minks DILATION;  Surgeon: Daneil Dolin, MD;  [facility];  Service: Endoscopy;  Laterality: N/A;  . PORTACATH PLACEMENT  06/2007   "removed ~ 1 yr later"  . RIGHT  HEART CATHETERIZATION N/A 02/24/2015   Procedure: RIGHT HEART CATH;  Surgeon: Jolaine Artist, MD;  Location: [facility] CATH LAB;  Service: Cardiovascular;  Laterality: N/A;  . SAVORY DILATION N/A 07/02/2014   Procedure: SAVORY DILATION;  Surgeon: Daneil Dolin, MD;  [facility];  Service: Endoscopy;  Laterality: N/A;  7:30   Physical exam:  Vitals:   04/20/18 0934  BP: 109/68  Pulse: 70  Resp: 16  Temp: (!) 97 F (36.1 C)  TempSrc: Oral  SpO2: 98%  Weight: 174 lb (78.9 kg)  Height: 5\' 10"  (1.778 m)    Extremities: Pulsatile fistula right upper extremity which is pulsatile all the way to the level of the shoulder.  Assessment: Pulsatile AV fistula with history of prolonged bleeding.  Plan: The patient will be scheduled for a fistulogram by my partner Dr. Bridgett Larsson Apr 27, 2018.  Risk benefits possible complications and procedure details including but not limited to bleeding infection vessel injury were discussed with the patient.  I also discussed with him he may have some narrowing  of the outflow vein.  Dr. Bridgett Larsson will proceed with an intervention if necessary at the time of his procedure.  We will stop his warfarin a few days prior.  Ruta Hinds, MD [facility] [place] Office: [phone] Pager: [phone]

## 2018-04-21 DIAGNOSIS — N2581 Secondary hyperparathyroidism of renal origin: Secondary | ICD-10-CM | POA: Diagnosis not present

## 2018-04-21 DIAGNOSIS — D631 Anemia in chronic kidney disease: Secondary | ICD-10-CM | POA: Diagnosis not present

## 2018-04-21 DIAGNOSIS — D509 Iron deficiency anemia, unspecified: Secondary | ICD-10-CM | POA: Diagnosis not present

## 2018-04-21 DIAGNOSIS — N186 End stage renal disease: Secondary | ICD-10-CM | POA: Diagnosis not present

## 2018-04-21 DIAGNOSIS — Z992 Dependence on renal dialysis: Secondary | ICD-10-CM | POA: Diagnosis not present

## 2018-04-23 ENCOUNTER — Other Ambulatory Visit (HOSPITAL_COMMUNITY): Payer: Self-pay | Admitting: Internal Medicine

## 2018-04-23 DIAGNOSIS — L89153 Pressure ulcer of sacral region, stage 3: Secondary | ICD-10-CM | POA: Diagnosis not present

## 2018-04-23 DIAGNOSIS — L89610 Pressure ulcer of right heel, unstageable: Secondary | ICD-10-CM | POA: Diagnosis not present

## 2018-04-23 DIAGNOSIS — E1122 Type 2 diabetes mellitus with diabetic chronic kidney disease: Secondary | ICD-10-CM | POA: Diagnosis not present

## 2018-04-23 DIAGNOSIS — I255 Ischemic cardiomyopathy: Secondary | ICD-10-CM | POA: Diagnosis not present

## 2018-04-23 DIAGNOSIS — I120 Hypertensive chronic kidney disease with stage 5 chronic kidney disease or end stage renal disease: Secondary | ICD-10-CM | POA: Diagnosis not present

## 2018-04-23 DIAGNOSIS — I2510 Atherosclerotic heart disease of native coronary artery without angina pectoris: Secondary | ICD-10-CM | POA: Diagnosis not present

## 2018-04-24 DIAGNOSIS — N2581 Secondary hyperparathyroidism of renal origin: Secondary | ICD-10-CM | POA: Diagnosis not present

## 2018-04-24 DIAGNOSIS — D509 Iron deficiency anemia, unspecified: Secondary | ICD-10-CM | POA: Diagnosis not present

## 2018-04-24 DIAGNOSIS — Z992 Dependence on renal dialysis: Secondary | ICD-10-CM | POA: Diagnosis not present

## 2018-04-24 DIAGNOSIS — D631 Anemia in chronic kidney disease: Secondary | ICD-10-CM | POA: Diagnosis not present

## 2018-04-24 DIAGNOSIS — N186 End stage renal disease: Secondary | ICD-10-CM | POA: Diagnosis not present

## 2018-04-25 DIAGNOSIS — L89153 Pressure ulcer of sacral region, stage 3: Secondary | ICD-10-CM | POA: Diagnosis not present

## 2018-04-25 DIAGNOSIS — I120 Hypertensive chronic kidney disease with stage 5 chronic kidney disease or end stage renal disease: Secondary | ICD-10-CM | POA: Diagnosis not present

## 2018-04-25 DIAGNOSIS — L89610 Pressure ulcer of right heel, unstageable: Secondary | ICD-10-CM | POA: Diagnosis not present

## 2018-04-25 DIAGNOSIS — I2510 Atherosclerotic heart disease of native coronary artery without angina pectoris: Secondary | ICD-10-CM | POA: Diagnosis not present

## 2018-04-25 DIAGNOSIS — E1122 Type 2 diabetes mellitus with diabetic chronic kidney disease: Secondary | ICD-10-CM | POA: Diagnosis not present

## 2018-04-25 DIAGNOSIS — I255 Ischemic cardiomyopathy: Secondary | ICD-10-CM | POA: Diagnosis not present

## 2018-04-26 DIAGNOSIS — D631 Anemia in chronic kidney disease: Secondary | ICD-10-CM | POA: Diagnosis not present

## 2018-04-26 DIAGNOSIS — N186 End stage renal disease: Secondary | ICD-10-CM | POA: Diagnosis not present

## 2018-04-26 DIAGNOSIS — Z992 Dependence on renal dialysis: Secondary | ICD-10-CM | POA: Diagnosis not present

## 2018-04-26 DIAGNOSIS — N2581 Secondary hyperparathyroidism of renal origin: Secondary | ICD-10-CM | POA: Diagnosis not present

## 2018-04-26 DIAGNOSIS — D509 Iron deficiency anemia, unspecified: Secondary | ICD-10-CM | POA: Diagnosis not present

## 2018-04-27 ENCOUNTER — Other Ambulatory Visit: Payer: Self-pay | Admitting: *Deleted

## 2018-04-27 ENCOUNTER — Telehealth: Payer: Self-pay | Admitting: *Deleted

## 2018-04-27 ENCOUNTER — Encounter (HOSPITAL_COMMUNITY)
Admission: RE | Disposition: A | Discharge status: Home / Self Care | Payer: Self-pay | Source: Ambulatory Visit | Attending: Vascular Surgery

## 2018-04-27 ENCOUNTER — Ambulatory Visit (HOSPITAL_COMMUNITY)
Admission: RE | Admit: 2018-04-27 | Discharge: 2018-04-27 | Disposition: A | Discharge status: Home / Self Care | Payer: Medicare Other | Source: Ambulatory Visit | Attending: Vascular Surgery | Admitting: Vascular Surgery

## 2018-04-27 DIAGNOSIS — I5022 Chronic systolic (congestive) heart failure: Secondary | ICD-10-CM | POA: Diagnosis not present

## 2018-04-27 DIAGNOSIS — Z7902 Long term (current) use of antithrombotics/antiplatelets: Secondary | ICD-10-CM | POA: Insufficient documentation

## 2018-04-27 DIAGNOSIS — I252 Old myocardial infarction: Secondary | ICD-10-CM | POA: Insufficient documentation

## 2018-04-27 DIAGNOSIS — N184 Chronic kidney disease, stage 4 (severe): Secondary | ICD-10-CM | POA: Diagnosis not present

## 2018-04-27 DIAGNOSIS — I2510 Atherosclerotic heart disease of native coronary artery without angina pectoris: Secondary | ICD-10-CM | POA: Insufficient documentation

## 2018-04-27 DIAGNOSIS — Z95810 Presence of automatic (implantable) cardiac defibrillator: Secondary | ICD-10-CM | POA: Diagnosis not present

## 2018-04-27 DIAGNOSIS — T82898A Other specified complication of vascular prosthetic devices, implants and grafts, initial encounter: Secondary | ICD-10-CM | POA: Insufficient documentation

## 2018-04-27 DIAGNOSIS — E1122 Type 2 diabetes mellitus with diabetic chronic kidney disease: Secondary | ICD-10-CM | POA: Insufficient documentation

## 2018-04-27 DIAGNOSIS — I130 Hypertensive heart and chronic kidney disease with heart failure and stage 1 through stage 4 chronic kidney disease, or unspecified chronic kidney disease: Secondary | ICD-10-CM | POA: Diagnosis not present

## 2018-04-27 DIAGNOSIS — Y832 Surgical operation with anastomosis, bypass or graft as the cause of abnormal reaction of the patient, or of later complication, without mention of misadventure at the time of the procedure: Secondary | ICD-10-CM | POA: Diagnosis not present

## 2018-04-27 DIAGNOSIS — K7460 Unspecified cirrhosis of liver: Secondary | ICD-10-CM | POA: Insufficient documentation

## 2018-04-27 DIAGNOSIS — N186 End stage renal disease: Secondary | ICD-10-CM

## 2018-04-27 DIAGNOSIS — K219 Gastro-esophageal reflux disease without esophagitis: Secondary | ICD-10-CM | POA: Diagnosis not present

## 2018-04-27 DIAGNOSIS — Z992 Dependence on renal dialysis: Secondary | ICD-10-CM

## 2018-04-27 DIAGNOSIS — E669 Obesity, unspecified: Secondary | ICD-10-CM | POA: Diagnosis not present

## 2018-04-27 DIAGNOSIS — Z6825 Body mass index (BMI) 25.0-25.9, adult: Secondary | ICD-10-CM | POA: Diagnosis not present

## 2018-04-27 DIAGNOSIS — I255 Ischemic cardiomyopathy: Secondary | ICD-10-CM | POA: Diagnosis not present

## 2018-04-27 DIAGNOSIS — Z933 Colostomy status: Secondary | ICD-10-CM | POA: Diagnosis not present

## 2018-04-27 DIAGNOSIS — I480 Paroxysmal atrial fibrillation: Secondary | ICD-10-CM | POA: Diagnosis not present

## 2018-04-27 DIAGNOSIS — E785 Hyperlipidemia, unspecified: Secondary | ICD-10-CM | POA: Insufficient documentation

## 2018-04-27 DIAGNOSIS — Z8673 Personal history of transient ischemic attack (TIA), and cerebral infarction without residual deficits: Secondary | ICD-10-CM | POA: Insufficient documentation

## 2018-04-27 DIAGNOSIS — Z955 Presence of coronary angioplasty implant and graft: Secondary | ICD-10-CM | POA: Insufficient documentation

## 2018-04-27 DIAGNOSIS — G4733 Obstructive sleep apnea (adult) (pediatric): Secondary | ICD-10-CM | POA: Insufficient documentation

## 2018-04-27 LAB — POCT I-STAT, CHEM 8
BUN: 48 mg/dL — ABNORMAL HIGH (ref 6–20)
CHLORIDE: 97 mmol/L — AB (ref 101–111)
CREATININE: 3 mg/dL — AB (ref 0.61–1.24)
Calcium, Ion: 1.15 mmol/L (ref 1.15–1.40)
Glucose, Bld: 107 mg/dL — ABNORMAL HIGH (ref 65–99)
HEMATOCRIT: 40 % (ref 39.0–52.0)
HEMOGLOBIN: 13.6 g/dL (ref 13.0–17.0)
Potassium: 4.3 mmol/L (ref 3.5–5.1)
Sodium: 137 mmol/L (ref 135–145)
TCO2: 27 mmol/L (ref 22–32)

## 2018-04-27 LAB — GLUCOSE, CAPILLARY: Glucose-Capillary: 114 mg/dL — ABNORMAL HIGH (ref 65–99)

## 2018-04-27 LAB — PROTIME-INR
INR: 2.2
PROTHROMBIN TIME: 24.3 s — AB (ref 11.4–15.2)

## 2018-04-27 SURGERY — A/V FISTULAGRAM
Anesthesia: LOCAL

## 2018-04-27 MED ORDER — SODIUM CHLORIDE 0.9% FLUSH: 3.0000 mL | INTRAVENOUS | Status: DC | PRN

## 2018-04-27 MED ORDER — SODIUM CHLORIDE 0.9% FLUSH: 3.0000 mL | Freq: Two times a day (BID) | INTRAVENOUS | Status: DC

## 2018-04-27 MED ORDER — SODIUM CHLORIDE 0.9 % IV SOLN: 250.0000 mL | INTRAVENOUS | Status: DC | PRN

## 2018-04-27 NOTE — Telephone Encounter (Signed)
-----   Message from Willy Eddy, RN sent at 04/27/2018 12:21 PM EDT ----- Regarding: FW: Coumadin hold and Todays INR   ----- Message ----- From: Willy Eddy, RN Sent: 04/27/2018  12:14 PM To: Malen Gauze, RN Subject: Coumadin hold and Todays INR                   Patient was scheduled for Fistulogram today. He held is Coumadin for 4 days and todays INR was 2.20. We want to rescheduled this procedure for next Thursday. He has blood draw scheduled for 05/02/18 according to wife. We normally hold for 5 days pre-procedure. Should he take Coumadin today and then can we hold for 5 days?  Patient is waiting to hear what to do? Thank you for your help. Becky

## 2018-04-27 NOTE — Interval H&P Note (Signed)
   History and Physical Update  The patient was interviewed and re-examined.  The patient's previous History and Physical has been reviewed and is unchanged from Dr. Nona Dell consult.  There is no change in the plan of care: R arm fistulogram, possible intervention.   I discussed with the patient the nature of angiographic procedures, especially the limited patencies of any endovascular intervention.    The patient is aware of that the risks of an angiographic procedure include but are not limited to: bleeding, infection, access site complications, renal failure, embolization, rupture of vessel, dissection, arteriovenous fistula, possible need for emergent surgical intervention, possible need for surgical procedures to treat the patient's pathology, anaphylactic reaction to contrast, and stroke and death.    The patient is aware of the risks and agrees to proceed.   Adele Barthel, MD, FACS [facility] [place] Office: [phone] Pager: [phone]  04/27/2018, 9:28 AM

## 2018-04-27 NOTE — Progress Notes (Signed)
Instructions given to wife to be at [facility] admitting department at 9 am on 05/04/18 for procedure. Per Edrick Oh patient to hold Coumadin after 5/24 dose. Npo past MN night prior. Take Synthroid and Midodrine with sips of water am of and hold Novolog insulin. Must have a driver for discharge.

## 2018-04-27 NOTE — Telephone Encounter (Signed)
-----   Message from Malen Gauze, RN sent at 04/27/2018 12:29 PM EDT ----- Regarding: RE: Coumadin hold and Todays INR Hi Becky.  Not sure why his INR was 2.2 today after holding 4 days.  Must have been supratherapeutic for some reason.  My last check on 5/14 was 2.7.  It is OK to hold his coumadin for 5 days.  With procedure being on 5/30 that would put him taking his last dose of coumadin Friday night (5/24).   Thanks, Lattie Haw ----- Message ----- From: Willy Eddy, RN Sent: 04/27/2018  12:14 PM To: Malen Gauze, RN Subject: Coumadin hold and Todays INR                   Patient was scheduled for Fistulogram today. He held is Coumadin for 4 days and todays INR was 2.20. We want to rescheduled this procedure for next Thursday. He has blood draw scheduled for 05/02/18 according to wife. We normally hold for 5 days pre-procedure. Should he take Coumadin today and then can we hold for 5 days?  Patient is waiting to hear what to do? Thank you for your help. Becky

## 2018-04-28 DIAGNOSIS — N2581 Secondary hyperparathyroidism of renal origin: Secondary | ICD-10-CM | POA: Diagnosis not present

## 2018-04-28 DIAGNOSIS — N186 End stage renal disease: Secondary | ICD-10-CM | POA: Diagnosis not present

## 2018-04-28 DIAGNOSIS — D509 Iron deficiency anemia, unspecified: Secondary | ICD-10-CM | POA: Diagnosis not present

## 2018-04-28 DIAGNOSIS — D631 Anemia in chronic kidney disease: Secondary | ICD-10-CM | POA: Diagnosis not present

## 2018-04-28 DIAGNOSIS — Z992 Dependence on renal dialysis: Secondary | ICD-10-CM | POA: Diagnosis not present

## 2018-05-01 DIAGNOSIS — N186 End stage renal disease: Secondary | ICD-10-CM | POA: Diagnosis not present

## 2018-05-01 DIAGNOSIS — D631 Anemia in chronic kidney disease: Secondary | ICD-10-CM | POA: Diagnosis not present

## 2018-05-01 DIAGNOSIS — Z992 Dependence on renal dialysis: Secondary | ICD-10-CM | POA: Diagnosis not present

## 2018-05-01 DIAGNOSIS — N2581 Secondary hyperparathyroidism of renal origin: Secondary | ICD-10-CM | POA: Diagnosis not present

## 2018-05-01 DIAGNOSIS — D509 Iron deficiency anemia, unspecified: Secondary | ICD-10-CM | POA: Diagnosis not present

## 2018-05-02 DIAGNOSIS — L89153 Pressure ulcer of sacral region, stage 3: Secondary | ICD-10-CM | POA: Diagnosis not present

## 2018-05-02 DIAGNOSIS — I120 Hypertensive chronic kidney disease with stage 5 chronic kidney disease or end stage renal disease: Secondary | ICD-10-CM | POA: Diagnosis not present

## 2018-05-02 DIAGNOSIS — L89610 Pressure ulcer of right heel, unstageable: Secondary | ICD-10-CM | POA: Diagnosis not present

## 2018-05-02 DIAGNOSIS — E1122 Type 2 diabetes mellitus with diabetic chronic kidney disease: Secondary | ICD-10-CM | POA: Diagnosis not present

## 2018-05-02 DIAGNOSIS — I255 Ischemic cardiomyopathy: Secondary | ICD-10-CM | POA: Diagnosis not present

## 2018-05-02 DIAGNOSIS — I2510 Atherosclerotic heart disease of native coronary artery without angina pectoris: Secondary | ICD-10-CM | POA: Diagnosis not present

## 2018-05-03 DIAGNOSIS — D509 Iron deficiency anemia, unspecified: Secondary | ICD-10-CM | POA: Diagnosis not present

## 2018-05-03 DIAGNOSIS — Z992 Dependence on renal dialysis: Secondary | ICD-10-CM | POA: Diagnosis not present

## 2018-05-03 DIAGNOSIS — I120 Hypertensive chronic kidney disease with stage 5 chronic kidney disease or end stage renal disease: Secondary | ICD-10-CM | POA: Diagnosis not present

## 2018-05-03 DIAGNOSIS — I255 Ischemic cardiomyopathy: Secondary | ICD-10-CM | POA: Diagnosis not present

## 2018-05-03 DIAGNOSIS — N186 End stage renal disease: Secondary | ICD-10-CM | POA: Diagnosis not present

## 2018-05-03 DIAGNOSIS — E1122 Type 2 diabetes mellitus with diabetic chronic kidney disease: Secondary | ICD-10-CM | POA: Diagnosis not present

## 2018-05-03 DIAGNOSIS — N2581 Secondary hyperparathyroidism of renal origin: Secondary | ICD-10-CM | POA: Diagnosis not present

## 2018-05-03 DIAGNOSIS — L89153 Pressure ulcer of sacral region, stage 3: Secondary | ICD-10-CM | POA: Diagnosis not present

## 2018-05-03 DIAGNOSIS — L89610 Pressure ulcer of right heel, unstageable: Secondary | ICD-10-CM | POA: Diagnosis not present

## 2018-05-03 DIAGNOSIS — D631 Anemia in chronic kidney disease: Secondary | ICD-10-CM | POA: Diagnosis not present

## 2018-05-03 DIAGNOSIS — I2510 Atherosclerotic heart disease of native coronary artery without angina pectoris: Secondary | ICD-10-CM | POA: Diagnosis not present

## 2018-05-04 ENCOUNTER — Ambulatory Visit (HOSPITAL_COMMUNITY): Payer: Medicare Other

## 2018-05-04 ENCOUNTER — Encounter (HOSPITAL_COMMUNITY)
Admission: RE | Disposition: A | Discharge status: Home / Self Care | Payer: Self-pay | Source: Ambulatory Visit | Attending: Vascular Surgery

## 2018-05-04 ENCOUNTER — Ambulatory Visit (HOSPITAL_COMMUNITY)
Admission: RE | Admit: 2018-05-04 | Discharge: 2018-05-04 | Disposition: A | Discharge status: Home / Self Care | Payer: Medicare Other | Source: Ambulatory Visit | Attending: Vascular Surgery | Admitting: Vascular Surgery

## 2018-05-04 ENCOUNTER — Ambulatory Visit (HOSPITAL_COMMUNITY): Payer: Medicare Other | Admitting: Certified Registered"

## 2018-05-04 ENCOUNTER — Encounter (HOSPITAL_COMMUNITY): Payer: Self-pay | Admitting: Emergency Medicine

## 2018-05-04 ENCOUNTER — Other Ambulatory Visit: Payer: Self-pay | Admitting: *Deleted

## 2018-05-04 DIAGNOSIS — T82898A Other specified complication of vascular prosthetic devices, implants and grafts, initial encounter: Secondary | ICD-10-CM | POA: Diagnosis not present

## 2018-05-04 DIAGNOSIS — I255 Ischemic cardiomyopathy: Secondary | ICD-10-CM | POA: Diagnosis not present

## 2018-05-04 DIAGNOSIS — Z419 Encounter for procedure for purposes other than remedying health state, unspecified: Secondary | ICD-10-CM

## 2018-05-04 DIAGNOSIS — I132 Hypertensive heart and chronic kidney disease with heart failure and with stage 5 chronic kidney disease, or end stage renal disease: Secondary | ICD-10-CM | POA: Insufficient documentation

## 2018-05-04 DIAGNOSIS — E785 Hyperlipidemia, unspecified: Secondary | ICD-10-CM | POA: Diagnosis not present

## 2018-05-04 DIAGNOSIS — E669 Obesity, unspecified: Secondary | ICD-10-CM | POA: Insufficient documentation

## 2018-05-04 DIAGNOSIS — Z6824 Body mass index (BMI) 24.0-24.9, adult: Secondary | ICD-10-CM | POA: Diagnosis not present

## 2018-05-04 DIAGNOSIS — G4730 Sleep apnea, unspecified: Secondary | ICD-10-CM | POA: Insufficient documentation

## 2018-05-04 DIAGNOSIS — I509 Heart failure, unspecified: Secondary | ICD-10-CM | POA: Insufficient documentation

## 2018-05-04 DIAGNOSIS — Z95810 Presence of automatic (implantable) cardiac defibrillator: Secondary | ICD-10-CM | POA: Diagnosis not present

## 2018-05-04 DIAGNOSIS — I2510 Atherosclerotic heart disease of native coronary artery without angina pectoris: Secondary | ICD-10-CM | POA: Diagnosis not present

## 2018-05-04 DIAGNOSIS — K219 Gastro-esophageal reflux disease without esophagitis: Secondary | ICD-10-CM | POA: Insufficient documentation

## 2018-05-04 DIAGNOSIS — E1122 Type 2 diabetes mellitus with diabetic chronic kidney disease: Secondary | ICD-10-CM | POA: Diagnosis not present

## 2018-05-04 DIAGNOSIS — Z992 Dependence on renal dialysis: Secondary | ICD-10-CM | POA: Diagnosis not present

## 2018-05-04 DIAGNOSIS — T82858A Stenosis of vascular prosthetic devices, implants and grafts, initial encounter: Secondary | ICD-10-CM | POA: Insufficient documentation

## 2018-05-04 DIAGNOSIS — Y832 Surgical operation with anastomosis, bypass or graft as the cause of abnormal reaction of the patient, or of later complication, without mention of misadventure at the time of the procedure: Secondary | ICD-10-CM | POA: Diagnosis not present

## 2018-05-04 DIAGNOSIS — Z452 Encounter for adjustment and management of vascular access device: Secondary | ICD-10-CM | POA: Diagnosis not present

## 2018-05-04 DIAGNOSIS — Z7901 Long term (current) use of anticoagulants: Secondary | ICD-10-CM | POA: Diagnosis not present

## 2018-05-04 DIAGNOSIS — N186 End stage renal disease: Secondary | ICD-10-CM | POA: Insufficient documentation

## 2018-05-04 DIAGNOSIS — I480 Paroxysmal atrial fibrillation: Secondary | ICD-10-CM | POA: Insufficient documentation

## 2018-05-04 DIAGNOSIS — G4733 Obstructive sleep apnea (adult) (pediatric): Secondary | ICD-10-CM | POA: Insufficient documentation

## 2018-05-04 DIAGNOSIS — T82868A Thrombosis of vascular prosthetic devices, implants and grafts, initial encounter: Secondary | ICD-10-CM | POA: Insufficient documentation

## 2018-05-04 DIAGNOSIS — J90 Pleural effusion, not elsewhere classified: Secondary | ICD-10-CM | POA: Diagnosis not present

## 2018-05-04 DIAGNOSIS — I5023 Acute on chronic systolic (congestive) heart failure: Secondary | ICD-10-CM | POA: Diagnosis not present

## 2018-05-04 HISTORY — PX: INSERTION OF DIALYSIS CATHETER: SHX1324

## 2018-05-04 HISTORY — PX: A/V FISTULAGRAM: CATH118298

## 2018-05-04 LAB — GLUCOSE, CAPILLARY
Glucose-Capillary: 85 mg/dL (ref 65–99)
Glucose-Capillary: 76 mg/dL (ref 65–99)

## 2018-05-04 LAB — POCT I-STAT, CHEM 8
BUN: 56 mg/dL — ABNORMAL HIGH (ref 6–20)
CHLORIDE: 95 mmol/L — AB (ref 101–111)
CREATININE: 3 mg/dL — AB (ref 0.61–1.24)
Calcium, Ion: 0.82 mmol/L — CL (ref 1.15–1.40)
Glucose, Bld: 90 mg/dL (ref 65–99)
HCT: 38 % — ABNORMAL LOW (ref 39.0–52.0)
HEMOGLOBIN: 12.9 g/dL — AB (ref 13.0–17.0)
Potassium: 3.8 mmol/L (ref 3.5–5.1)
SODIUM: 135 mmol/L (ref 135–145)
TCO2: 25 mmol/L (ref 22–32)

## 2018-05-04 LAB — PROTIME-INR
INR: 1.54
Prothrombin Time: 18.4 seconds — ABNORMAL HIGH (ref 11.4–15.2)

## 2018-05-04 SURGERY — A/V FISTULAGRAM
Anesthesia: LOCAL | Laterality: Right

## 2018-05-04 SURGERY — INSERTION OF DIALYSIS CATHETER
Anesthesia: Monitor Anesthesia Care | Site: Chest | Laterality: Right

## 2018-05-04 MED ORDER — ONDANSETRON HCL 4 MG/2ML IJ SOLN
INTRAMUSCULAR | Status: DC | PRN
  Administered 2018-05-04: 4 mg via INTRAVENOUS

## 2018-05-04 MED ORDER — HEPARIN SODIUM (PORCINE) 5000 UNIT/ML IJ SOLN
INTRAMUSCULAR | Status: AC
  Filled 2018-05-04: qty 1.2

## 2018-05-04 MED ORDER — HEPARIN SODIUM (PORCINE) 1000 UNIT/ML IJ SOLN
INTRAMUSCULAR | Status: AC
  Filled 2018-05-04: qty 1

## 2018-05-04 MED ORDER — FENTANYL CITRATE (PF) 100 MCG/2ML IJ SOLN
INTRAMUSCULAR | Status: AC
  Filled 2018-05-04: qty 2

## 2018-05-04 MED ORDER — PROPOFOL 500 MG/50ML IV EMUL
INTRAVENOUS | Status: DC | PRN
  Administered 2018-05-04: 100 ug/kg/min via INTRAVENOUS

## 2018-05-04 MED ORDER — MORPHINE SULFATE (PF) 10 MG/ML IV SOLN: 2.0000 mg | INTRAVENOUS | Status: DC | PRN

## 2018-05-04 MED ORDER — IODIXANOL 320 MG/ML IV SOLN
INTRAVENOUS | Status: DC | PRN
  Administered 2018-05-04: 30 mL via INTRAVENOUS

## 2018-05-04 MED ORDER — ASPIRIN EC 81 MG PO TBEC: 81.0000 mg | DELAYED_RELEASE_TABLET | Freq: Every day | ORAL | 2 refills | Status: AC

## 2018-05-04 MED ORDER — SODIUM CHLORIDE 0.9 % IV SOLN
INTRAVENOUS | Status: DC
  Administered 2018-05-04: 15:00:00 via INTRAVENOUS

## 2018-05-04 MED ORDER — 0.9 % SODIUM CHLORIDE (POUR BTL) OPTIME
TOPICAL | Status: DC | PRN
  Administered 2018-05-04: 1000 mL

## 2018-05-04 MED ORDER — LIDOCAINE HCL (PF) 1 % IJ SOLN
INTRAMUSCULAR | Status: AC
  Filled 2018-05-04: qty 30

## 2018-05-04 MED ORDER — FENTANYL CITRATE (PF) 250 MCG/5ML IJ SOLN
INTRAMUSCULAR | Status: AC
  Filled 2018-05-04: qty 5

## 2018-05-04 MED ORDER — CEFAZOLIN SODIUM-DEXTROSE 2-4 GM/100ML-% IV SOLN
2.0000 g | INTRAVENOUS | Status: AC
  Administered 2018-05-04: 2 g via INTRAVENOUS
  Filled 2018-05-04: qty 100

## 2018-05-04 MED ORDER — PROPOFOL 1000 MG/100ML IV EMUL
INTRAVENOUS | Status: AC
  Filled 2018-05-04: qty 100

## 2018-05-04 MED ORDER — LIDOCAINE HCL (PF) 1 % IJ SOLN
INTRAMUSCULAR | Status: DC | PRN
  Administered 2018-05-04: 2 mL via INTRADERMAL

## 2018-05-04 MED ORDER — MIDAZOLAM HCL 2 MG/2ML IJ SOLN
INTRAMUSCULAR | Status: AC
  Filled 2018-05-04: qty 2

## 2018-05-04 MED ORDER — MIDAZOLAM HCL 2 MG/2ML IJ SOLN
INTRAMUSCULAR | Status: DC | PRN
  Administered 2018-05-04: 1 mg via INTRAVENOUS

## 2018-05-04 MED ORDER — HEPARIN (PORCINE) IN NACL 1000-0.9 UT/500ML-% IV SOLN
INTRAVENOUS | Status: AC
  Filled 2018-05-04: qty 500

## 2018-05-04 MED ORDER — SODIUM CHLORIDE 0.9% FLUSH: 3.0000 mL | INTRAVENOUS | Status: DC | PRN

## 2018-05-04 MED ORDER — PROPOFOL 10 MG/ML IV BOLUS
INTRAVENOUS | Status: DC | PRN
  Administered 2018-05-04: 20 mg via INTRAVENOUS

## 2018-05-04 MED ORDER — FENTANYL CITRATE (PF) 100 MCG/2ML IJ SOLN: 25.0000 ug | INTRAMUSCULAR | Status: DC | PRN

## 2018-05-04 MED ORDER — LIDOCAINE-EPINEPHRINE (PF) 1 %-1:200000 IJ SOLN
INTRAMUSCULAR | Status: DC | PRN
  Administered 2018-05-04: 9 mL

## 2018-05-04 MED ORDER — ONDANSETRON HCL 4 MG/2ML IJ SOLN
INTRAMUSCULAR | Status: AC
  Filled 2018-05-04: qty 2

## 2018-05-04 MED ORDER — HEPARIN (PORCINE) IN NACL 2-0.9 UNITS/ML
INTRAMUSCULAR | Status: AC | PRN
  Administered 2018-05-04: 500 mL

## 2018-05-04 MED ORDER — FENTANYL CITRATE (PF) 100 MCG/2ML IJ SOLN
INTRAMUSCULAR | Status: DC | PRN
  Administered 2018-05-04: 25 ug via INTRAVENOUS

## 2018-05-04 MED ORDER — SODIUM CHLORIDE 0.9 % IV SOLN
INTRAVENOUS | Status: DC | PRN
  Administered 2018-05-04: 15:00:00

## 2018-05-04 MED ORDER — HEPARIN SODIUM (PORCINE) 1000 UNIT/ML IJ SOLN
INTRAMUSCULAR | Status: DC | PRN
  Administered 2018-05-04: 3400 [IU]

## 2018-05-04 MED ORDER — LIDOCAINE-EPINEPHRINE 1 %-1:200000 IJ SOLN
INTRAMUSCULAR | Status: AC
Start: 2018-05-04 — End: ?
  Filled 2018-05-04: qty 30

## 2018-05-04 SURGICAL SUPPLY — 43 items
ADH SKN CLS APL DERMABOND .7 (GAUZE/BANDAGES/DRESSINGS) ×1
BAG DECANTER FOR FLEXI CONT (MISCELLANEOUS) ×3 IMPLANT
BIOPATCH RED 1 DISK 7.0 (GAUZE/BANDAGES/DRESSINGS) ×2 IMPLANT
BIOPATCH RED 1IN DISK 7.0MM (GAUZE/BANDAGES/DRESSINGS) ×1
CATH PALINDROME RT-P 15FX19CM (CATHETERS) IMPLANT
CATH PALINDROME RT-P 15FX23CM (CATHETERS) ×2 IMPLANT
CATH PALINDROME RT-P 15FX28CM (CATHETERS) IMPLANT
CATH PALINDROME RT-P 15FX55CM (CATHETERS) IMPLANT
CHLORAPREP W/TINT 26ML (MISCELLANEOUS) ×1 IMPLANT
COVER PROBE W GEL 5X96 (DRAPES) ×2 IMPLANT
COVER SURGICAL LIGHT HANDLE (MISCELLANEOUS) ×3 IMPLANT
DERMABOND ADVANCED (GAUZE/BANDAGES/DRESSINGS) ×2
DERMABOND ADVANCED .7 DNX12 (GAUZE/BANDAGES/DRESSINGS) IMPLANT
DRAPE C-ARM 42X72 X-RAY (DRAPES) ×3 IMPLANT
DRAPE CHEST BREAST 15X10 FENES (DRAPES) ×3 IMPLANT
GAUZE SPONGE 4X4 16PLY XRAY LF (GAUZE/BANDAGES/DRESSINGS) ×3 IMPLANT
GLOVE BIO SURGEON STRL SZ7.5 (GLOVE) ×3 IMPLANT
GLOVE BIOGEL PI IND STRL 6.5 (GLOVE) IMPLANT
GLOVE BIOGEL PI IND STRL 8 (GLOVE) ×1 IMPLANT
GLOVE BIOGEL PI INDICATOR 6.5 (GLOVE) ×2
GLOVE BIOGEL PI INDICATOR 8 (GLOVE) ×2
GLOVE SURG SS PI 6.5 STRL IVOR (GLOVE) ×2 IMPLANT
GOWN STRL REUS W/ TWL LRG LVL3 (GOWN DISPOSABLE) ×2 IMPLANT
GOWN STRL REUS W/TWL LRG LVL3 (GOWN DISPOSABLE) ×6
KIT BASIN OR (CUSTOM PROCEDURE TRAY) ×3 IMPLANT
KIT TURNOVER KIT B (KITS) ×3 IMPLANT
NDL 18GX1X1/2 (RX/OR ONLY) (NEEDLE) ×1 IMPLANT
NDL HYPO 25GX1X1/2 BEV (NEEDLE) ×1 IMPLANT
NEEDLE 18GX1X1/2 (RX/OR ONLY) (NEEDLE) ×3 IMPLANT
NEEDLE HYPO 25GX1X1/2 BEV (NEEDLE) ×3 IMPLANT
NS IRRIG 1000ML POUR BTL (IV SOLUTION) ×3 IMPLANT
PACK SURGICAL SETUP 50X90 (CUSTOM PROCEDURE TRAY) ×3 IMPLANT
PAD ARMBOARD 7.5X6 YLW CONV (MISCELLANEOUS) ×6 IMPLANT
SET MICROPUNCTURE 5F STIFF (MISCELLANEOUS) ×2 IMPLANT
SUT ETHILON 3 0 PS 1 (SUTURE) ×3 IMPLANT
SUT VICRYL 4-0 PS2 18IN ABS (SUTURE) ×3 IMPLANT
SYR 10ML LL (SYRINGE) ×3 IMPLANT
SYR 20CC LL (SYRINGE) ×6 IMPLANT
SYR 5ML LL (SYRINGE) ×6 IMPLANT
SYR CONTROL 10ML LL (SYRINGE) ×3 IMPLANT
TOWEL GREEN STERILE (TOWEL DISPOSABLE) ×4 IMPLANT
TOWEL GREEN STERILE FF (TOWEL DISPOSABLE) ×3 IMPLANT
WATER STERILE IRR 1000ML POUR (IV SOLUTION) ×3 IMPLANT

## 2018-05-04 SURGICAL SUPPLY — 19 items
BAG SNAP BAND KOVER 36X36 (MISCELLANEOUS) ×2 IMPLANT
CATH ANGIO 5F BER2 65CM (CATHETERS) ×1 IMPLANT
CATH SOFT-VU 4F 65 STRAIGHT (CATHETERS) IMPLANT
CATH SOFT-VU STRAIGHT 4F 65CM (CATHETERS) ×2
COVER DOME SNAP 22 D (MISCELLANEOUS) ×2 IMPLANT
COVER PRB 48X5XTLSCP FOLD TPE (BAG) ×1 IMPLANT
COVER PROBE 5X48 (BAG) ×2
DEVICE TORQUE .025-.038 (MISCELLANEOUS) ×1 IMPLANT
GUIDEWIRE ANGLED .035X150CM (WIRE) ×1 IMPLANT
KIT MICROPUNCTURE NIT STIFF (SHEATH) ×1 IMPLANT
PROTECTION STATION PRESSURIZED (MISCELLANEOUS) ×2
SHEATH PINNACLE R/O II 6F 4CM (SHEATH) ×1 IMPLANT
STATION PROTECTION PRESSURIZED (MISCELLANEOUS) ×1 IMPLANT
STOPCOCK MORSE 400PSI 3WAY (MISCELLANEOUS) ×2 IMPLANT
TRAY PV CATH (CUSTOM PROCEDURE TRAY) ×2 IMPLANT
TUBING CIL FLEX 10 FLL-RA (TUBING) ×2 IMPLANT
WIRE BENTSON .035X145CM (WIRE) ×1 IMPLANT
WIRE G V18X300CM (WIRE) ×1 IMPLANT
WIRE TORQFLEX AUST .018X40CM (WIRE) ×1 IMPLANT

## 2018-05-04 NOTE — Interval H&P Note (Signed)
History and Physical Interval Note:  05/04/2018 2:13 PM  [name]  has presented today for surgery, with the diagnosis of end stage renal disease  The various methods of treatment have been discussed with the patient and family. After consideration of risks, benefits and other options for treatment, the patient has consented to  Procedure(s): INSERTION OF DIALYSIS CATHETER (N/A) as a surgical intervention .  The patient's history has been reviewed, patient examined, no change in status, stable for surgery.  I have reviewed the patient's chart and labs.  Questions were answered to the patient's satisfaction.     Deitra Mayo

## 2018-05-04 NOTE — Transfer of Care (Signed)
Immediate Anesthesia Transfer of Care Note  Patient: [name]  Procedure(s) Performed: INSERTION OF DIALYSIS CATHETER (Right Chest)  Patient Location: PACU  Anesthesia Type:MAC  Level of Consciousness: awake and alert   Airway & Oxygen Therapy: Patient Spontanous Breathing and Patient connected to nasal cannula oxygen  Post-op Assessment: Report given to RN and Post -op Vital signs reviewed and stable  Post vital signs: Reviewed and stable  Last Vitals:  Vitals Value Taken Time  BP 97/61 05/04/2018  3:41 PM  Temp    Pulse 77 05/04/2018  3:42 PM  Resp 17 05/04/2018  3:42 PM  SpO2 97 % 05/04/2018  3:42 PM  Vitals shown include unvalidated device data.  Last Pain:  Vitals:   05/04/18 1341  TempSrc:   PainSc: 0-No pain         Complications: No apparent anesthesia complications

## 2018-05-04 NOTE — Interval H&P Note (Signed)
   History and Physical Update  The patient was interviewed and re-examined.  The patient's previous History and Physical has been reviewed and is unchanged from Dr. Nona Dell consult.  There is no change in the plan of care: right arm fistulogram, possible intervention.     I discussed with the patient the nature of angiographic procedures, especially the limited patencies of any endovascular intervention.    The patient is aware of that the risks of an angiographic procedure include but are not limited to: bleeding, infection, access site complications, renal failure, embolization, rupture of vessel, dissection, arteriovenous fistula, possible need for emergent surgical intervention, possible need for surgical procedures to treat the patient's pathology, anaphylactic reaction to contrast, and stroke and death.    The patient is aware of the risks and agrees to proceed.  Adele Barthel, MD, FACS [facility] [place] Office: [phone] Pager: [phone]  05/04/2018, 11:53 AM

## 2018-05-04 NOTE — [place] Notes (Signed)
DC instructions reviewed with patient and wife at bedside, no questions or concerns, DC IV.  Rowe Pavy, RN

## 2018-05-04 NOTE — Anesthesia Procedure Notes (Signed)
Procedure Name: MAC Date/Time: 05/04/2018 2:54 PM Performed by: Imagene Riches, CRNA Pre-anesthesia Checklist: Patient identified, Emergency Drugs available, Suction available and Patient being monitored Patient Re-evaluated:Patient Re-evaluated prior to induction Oxygen Delivery Method: Simple face mask Preoxygenation: Pre-oxygenation with 100% oxygen

## 2018-05-04 NOTE — Op Note (Signed)
05/04/2018  PREOP DIAGNOSIS: Chronic kidney disease  POSTOP DIAGNOSIS: Chronic kidney disease  PROCEDURE: Ultrasound guided placement of right IJ tunneled dialysis catheter  (23 cm)  SURGEON: Judeth Cornfield. Scot Dock, MD, FACS  ASSIST: none  ANESTHESIA: local with sedation   EBL: minimal  FINDINGS: patent right IJ  INDICATIONS: Occluded AVF  TECHNIQUE: The patient was taken to the operating room and sedated by anesthesia. The neck and upper chest were prepped and draped in the usual sterile fashion. After the skin was anesthetized with 1% lidocaine, and under ultrasound guidance, the right IJ was cannulated and a guidewire introduced into the superior vena cava under fluoroscopic control. The tract over the wire was dilated and then the dilator and peel-away sheath were passed over the wire and the wire and dilator removed. The catheter was passed through the peel-away sheath and positioned in the right atrium. The exit site for the catheter was selected and the skin anesthetized between the 2 areas. The catheter was then brought through the tunnel, cut to the appropriate length, and the distal ports were attached. Both ports withdrew easily, were then flushed with heparinized saline and filled with concentrated heparin. The catheter was secured at its exit site with a 3-0 nylon suture. The IJ cannulation site was closed with a 4-0 subcuticular stitch. A sterile dressing was applied. The patient tolerated the procedure well and was transferred to the recovery room in stable condition. All needle and sponge counts were correct.  Robert Mayo, MD, FACS [facility] [place]: 05/04/2018 DATE OF DICTATION: 05/04/2018

## 2018-05-04 NOTE — Anesthesia Postprocedure Evaluation (Signed)
Anesthesia Post Note  Patient: [name]  Procedure(s) Performed: INSERTION OF DIALYSIS CATHETER (Right Chest)     Patient location during evaluation: PACU Anesthesia Type: MAC Level of consciousness: awake Pain management: pain level controlled Vital Signs Assessment: post-procedure vital signs reviewed and stable Respiratory status: spontaneous breathing Cardiovascular status: stable Anesthetic complications: no    Last Vitals:  Vitals:   05/04/18 1656 05/04/18 1700  BP: 102/61 (!) 106/58  Pulse: 67   Resp: 16 16  Temp:    SpO2: 95% 94%    Last Pain:  Vitals:   05/04/18 1700  TempSrc:   PainSc: 0-No pain                 Aalijah Lanphere

## 2018-05-04 NOTE — Anesthesia Preprocedure Evaluation (Addendum)
Anesthesia Evaluation  Patient identified by MRN, date of birth, ID band Patient awake    Reviewed: Allergy & Precautions, NPO status , Patient's Chart, lab work & pertinent test results  History of Anesthesia Complications (+) PONV  Airway Mallampati: II  TM Distance: >3 FB     Dental   Pulmonary sleep apnea , pneumonia,    breath sounds clear to auscultation       Cardiovascular hypertension, + CAD, + Past MI and +CHF  (-) Cardiac Stents + dysrhythmias + pacemaker + Cardiac Defibrillator  Rhythm:Regular Rate:Normal     Neuro/Psych    GI/Hepatic GERD  ,  Endo/Other  diabetes  Renal/GU Renal disease     Musculoskeletal   Abdominal   Peds  Hematology   Anesthesia Other Findings   Reproductive/Obstetrics                             Anesthesia Physical Anesthesia Plan  ASA: III  Anesthesia Plan: MAC   Post-op Pain Management:    Induction: Intravenous  PONV Risk Score and Plan: Treatment may vary due to age or medical condition  Airway Management Planned: Simple Face Mask and Nasal Cannula  Additional Equipment:   Intra-op Plan:   Post-operative Plan:   Informed Consent: I have reviewed the patients History and Physical, chart, labs and discussed the procedure including the risks, benefits and alternatives for the proposed anesthesia with the patient or authorized representative who has indicated his/her understanding and acceptance.   Dental advisory given  Plan Discussed with: CRNA and Anesthesiologist  Anesthesia Plan Comments:         Anesthesia Quick Evaluation

## 2018-05-04 NOTE — Op Note (Signed)
    OPERATIVE NOTE   PROCEDURE: 1.  right brachiocephalic arteriovenous fistula cannulation under ultrasound guidance 2.  right arm shuntogram  PRE-OPERATIVE DIAGNOSIS: right brachiocephalic arteriovenous fistula with poor flow rates   POST-OPERATIVE DIAGNOSIS: same as above   SURGEON: Adele Barthel, MD  ANESTHESIA: local  ESTIMATED BLOOD LOSS: 50 cc  FINDING(S): 1. Thrombosed right brachiocephalic arteriovenous fistula  2. 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 male who presents with left brachiocephalic arteriovenous fistula with poor flow rates.  The patient is scheduled for right arm shuntogram.  The patient is aware the risks include but are not limited to: bleeding, infection, thrombosis of the cannulated access, and possible anaphylactic reaction to the contrast.  The patient is aware of the risks of the procedure and elects to proceed forward.   DESCRIPTION: After full informed written consent was obtained, the patient was brought back to the angiography suite and placed supine upon the angiography table.  The patient was connected to monitoring equipment.  The right upper arm was prepped and draped in the standard fashion for a right arm shuntogram.  Under ultrasound guidance, the right brachiocephalic arteriovenous fistula was cannulated with a micropuncture needle.  The microwire was advanced into the fistula and the needle was exchanged for the a microsheath, which was lodged 2 cm into the access.  The wire was removed and the sheath was connected to the IV extension tubing.  Hand injections were completed to image the access from the cannulation site up the right atrium.  Upon doing the distal injection, it became evident that in fact the brachiocephalic arteriovenous fistula was occluded and the imaging was demonstrating  retrograde perfusion of the right brachial artery, axillary artery, subclavian artery and innominate artery.    I placed a Bentson wire and then exchanged the sheath for a short 6-Fr sheath.  I used a BER-2 and Bentson to get part of the way up the brachiocephalic arteriovenous fistula.  I did a hand injection which demonstrated occlusion of the mid-segment brachiocephalic arteriovenous fistula.  I exchanged the wire for a Glidewire.  Using this wire and BER-2 catheter, I was able to get into the proximal axillary vein.  I could not cross the high grade stenosis with the Glidewire.  I was able however to cross the stenosis with a V-18 wire.  I could get the wire into the right atrium, alongside the pacer wire.  I tried to cross the venous stenosis with the BER-2.  It would not pass.  I then tried a 4-Fr straight catheter over the wire.  This also would not cross the venous stenosis.  No intervention is possible as if a 4-Fr straight catheter cannot cross the lesion, no angioplasty balloon will cross the lesion.  Based on the images, this patient will need: tunneled dialysis catheter placement.  A 4-0 Monocryl purse-string suture was sewn around the sheath.  The sheath was removed while tying down the suture.  A sterile bandage was applied to the puncture site.   COMPLICATIONS: none  CONDITION: stable   Adele Barthel, MD, [facility] [place] Office: [phone] Pager: [phone]  05/04/2018 1:13 PM

## 2018-05-05 ENCOUNTER — Encounter (HOSPITAL_COMMUNITY): Payer: Self-pay | Admitting: Vascular Surgery

## 2018-05-05 DIAGNOSIS — D631 Anemia in chronic kidney disease: Secondary | ICD-10-CM | POA: Diagnosis not present

## 2018-05-05 DIAGNOSIS — E1122 Type 2 diabetes mellitus with diabetic chronic kidney disease: Secondary | ICD-10-CM | POA: Diagnosis not present

## 2018-05-05 DIAGNOSIS — I120 Hypertensive chronic kidney disease with stage 5 chronic kidney disease or end stage renal disease: Secondary | ICD-10-CM | POA: Diagnosis not present

## 2018-05-05 DIAGNOSIS — N2581 Secondary hyperparathyroidism of renal origin: Secondary | ICD-10-CM | POA: Diagnosis not present

## 2018-05-05 DIAGNOSIS — L89610 Pressure ulcer of right heel, unstageable: Secondary | ICD-10-CM | POA: Diagnosis not present

## 2018-05-05 DIAGNOSIS — N186 End stage renal disease: Secondary | ICD-10-CM | POA: Diagnosis not present

## 2018-05-05 DIAGNOSIS — I2510 Atherosclerotic heart disease of native coronary artery without angina pectoris: Secondary | ICD-10-CM | POA: Diagnosis not present

## 2018-05-05 DIAGNOSIS — I255 Ischemic cardiomyopathy: Secondary | ICD-10-CM | POA: Diagnosis not present

## 2018-05-05 DIAGNOSIS — D509 Iron deficiency anemia, unspecified: Secondary | ICD-10-CM | POA: Diagnosis not present

## 2018-05-05 DIAGNOSIS — L89153 Pressure ulcer of sacral region, stage 3: Secondary | ICD-10-CM | POA: Diagnosis not present

## 2018-05-05 DIAGNOSIS — Z992 Dependence on renal dialysis: Secondary | ICD-10-CM | POA: Diagnosis not present

## 2018-05-08 DIAGNOSIS — D509 Iron deficiency anemia, unspecified: Secondary | ICD-10-CM | POA: Diagnosis not present

## 2018-05-08 DIAGNOSIS — Z992 Dependence on renal dialysis: Secondary | ICD-10-CM | POA: Diagnosis not present

## 2018-05-08 DIAGNOSIS — N186 End stage renal disease: Secondary | ICD-10-CM | POA: Diagnosis not present

## 2018-05-08 DIAGNOSIS — D631 Anemia in chronic kidney disease: Secondary | ICD-10-CM | POA: Diagnosis not present

## 2018-05-08 DIAGNOSIS — N2581 Secondary hyperparathyroidism of renal origin: Secondary | ICD-10-CM | POA: Diagnosis not present

## 2018-05-09 DIAGNOSIS — I120 Hypertensive chronic kidney disease with stage 5 chronic kidney disease or end stage renal disease: Secondary | ICD-10-CM | POA: Diagnosis not present

## 2018-05-09 DIAGNOSIS — I255 Ischemic cardiomyopathy: Secondary | ICD-10-CM | POA: Diagnosis not present

## 2018-05-09 DIAGNOSIS — E1122 Type 2 diabetes mellitus with diabetic chronic kidney disease: Secondary | ICD-10-CM | POA: Diagnosis not present

## 2018-05-09 DIAGNOSIS — L89610 Pressure ulcer of right heel, unstageable: Secondary | ICD-10-CM | POA: Diagnosis not present

## 2018-05-09 DIAGNOSIS — I2510 Atherosclerotic heart disease of native coronary artery without angina pectoris: Secondary | ICD-10-CM | POA: Diagnosis not present

## 2018-05-09 DIAGNOSIS — L89153 Pressure ulcer of sacral region, stage 3: Secondary | ICD-10-CM | POA: Diagnosis not present

## 2018-05-10 DIAGNOSIS — N186 End stage renal disease: Secondary | ICD-10-CM | POA: Diagnosis not present

## 2018-05-10 DIAGNOSIS — N2581 Secondary hyperparathyroidism of renal origin: Secondary | ICD-10-CM | POA: Diagnosis not present

## 2018-05-10 DIAGNOSIS — Z992 Dependence on renal dialysis: Secondary | ICD-10-CM | POA: Diagnosis not present

## 2018-05-10 DIAGNOSIS — D631 Anemia in chronic kidney disease: Secondary | ICD-10-CM | POA: Diagnosis not present

## 2018-05-10 DIAGNOSIS — D509 Iron deficiency anemia, unspecified: Secondary | ICD-10-CM | POA: Diagnosis not present

## 2018-05-12 DIAGNOSIS — N186 End stage renal disease: Secondary | ICD-10-CM | POA: Diagnosis not present

## 2018-05-12 DIAGNOSIS — D509 Iron deficiency anemia, unspecified: Secondary | ICD-10-CM | POA: Diagnosis not present

## 2018-05-12 DIAGNOSIS — N2581 Secondary hyperparathyroidism of renal origin: Secondary | ICD-10-CM | POA: Diagnosis not present

## 2018-05-12 DIAGNOSIS — D631 Anemia in chronic kidney disease: Secondary | ICD-10-CM | POA: Diagnosis not present

## 2018-05-12 DIAGNOSIS — Z992 Dependence on renal dialysis: Secondary | ICD-10-CM | POA: Diagnosis not present

## 2018-05-15 DIAGNOSIS — N186 End stage renal disease: Secondary | ICD-10-CM | POA: Diagnosis not present

## 2018-05-15 DIAGNOSIS — D631 Anemia in chronic kidney disease: Secondary | ICD-10-CM | POA: Diagnosis not present

## 2018-05-15 DIAGNOSIS — N2581 Secondary hyperparathyroidism of renal origin: Secondary | ICD-10-CM | POA: Diagnosis not present

## 2018-05-15 DIAGNOSIS — Z992 Dependence on renal dialysis: Secondary | ICD-10-CM | POA: Diagnosis not present

## 2018-05-15 DIAGNOSIS — D509 Iron deficiency anemia, unspecified: Secondary | ICD-10-CM | POA: Diagnosis not present

## 2018-05-16 ENCOUNTER — Ambulatory Visit (INDEPENDENT_AMBULATORY_CARE_PROVIDER_SITE_OTHER): Payer: Medicare Other | Admitting: *Deleted

## 2018-05-16 DIAGNOSIS — Z5181 Encounter for therapeutic drug level monitoring: Secondary | ICD-10-CM | POA: Diagnosis not present

## 2018-05-16 DIAGNOSIS — I255 Ischemic cardiomyopathy: Secondary | ICD-10-CM | POA: Diagnosis not present

## 2018-05-16 DIAGNOSIS — I482 Chronic atrial fibrillation, unspecified: Secondary | ICD-10-CM

## 2018-05-16 DIAGNOSIS — E1122 Type 2 diabetes mellitus with diabetic chronic kidney disease: Secondary | ICD-10-CM | POA: Diagnosis not present

## 2018-05-16 DIAGNOSIS — L89610 Pressure ulcer of right heel, unstageable: Secondary | ICD-10-CM | POA: Diagnosis not present

## 2018-05-16 DIAGNOSIS — I2510 Atherosclerotic heart disease of native coronary artery without angina pectoris: Secondary | ICD-10-CM | POA: Diagnosis not present

## 2018-05-16 DIAGNOSIS — I120 Hypertensive chronic kidney disease with stage 5 chronic kidney disease or end stage renal disease: Secondary | ICD-10-CM | POA: Diagnosis not present

## 2018-05-16 DIAGNOSIS — L89153 Pressure ulcer of sacral region, stage 3: Secondary | ICD-10-CM | POA: Diagnosis not present

## 2018-05-16 LAB — POCT INR: INR: 2.1 (ref 2.0–3.0)

## 2018-05-16 NOTE — Patient Instructions (Signed)
Continue coumadin 1 tablet daily.  Recheck in 2 weeks Order given to [place]

## 2018-05-17 DIAGNOSIS — N2581 Secondary hyperparathyroidism of renal origin: Secondary | ICD-10-CM | POA: Diagnosis not present

## 2018-05-17 DIAGNOSIS — D509 Iron deficiency anemia, unspecified: Secondary | ICD-10-CM | POA: Diagnosis not present

## 2018-05-17 DIAGNOSIS — N186 End stage renal disease: Secondary | ICD-10-CM | POA: Diagnosis not present

## 2018-05-17 DIAGNOSIS — Z992 Dependence on renal dialysis: Secondary | ICD-10-CM | POA: Diagnosis not present

## 2018-05-17 DIAGNOSIS — D631 Anemia in chronic kidney disease: Secondary | ICD-10-CM | POA: Diagnosis not present

## 2018-05-19 DIAGNOSIS — D631 Anemia in chronic kidney disease: Secondary | ICD-10-CM | POA: Diagnosis not present

## 2018-05-19 DIAGNOSIS — N186 End stage renal disease: Secondary | ICD-10-CM | POA: Diagnosis not present

## 2018-05-19 DIAGNOSIS — D509 Iron deficiency anemia, unspecified: Secondary | ICD-10-CM | POA: Diagnosis not present

## 2018-05-19 DIAGNOSIS — N2581 Secondary hyperparathyroidism of renal origin: Secondary | ICD-10-CM | POA: Diagnosis not present

## 2018-05-19 DIAGNOSIS — Z992 Dependence on renal dialysis: Secondary | ICD-10-CM | POA: Diagnosis not present

## 2018-05-22 DIAGNOSIS — N186 End stage renal disease: Secondary | ICD-10-CM | POA: Diagnosis not present

## 2018-05-22 DIAGNOSIS — D509 Iron deficiency anemia, unspecified: Secondary | ICD-10-CM | POA: Diagnosis not present

## 2018-05-22 DIAGNOSIS — Z992 Dependence on renal dialysis: Secondary | ICD-10-CM | POA: Diagnosis not present

## 2018-05-22 DIAGNOSIS — D631 Anemia in chronic kidney disease: Secondary | ICD-10-CM | POA: Diagnosis not present

## 2018-05-22 DIAGNOSIS — N2581 Secondary hyperparathyroidism of renal origin: Secondary | ICD-10-CM | POA: Diagnosis not present

## 2018-05-23 DIAGNOSIS — I120 Hypertensive chronic kidney disease with stage 5 chronic kidney disease or end stage renal disease: Secondary | ICD-10-CM | POA: Diagnosis not present

## 2018-05-23 DIAGNOSIS — L89153 Pressure ulcer of sacral region, stage 3: Secondary | ICD-10-CM | POA: Diagnosis not present

## 2018-05-23 DIAGNOSIS — L89610 Pressure ulcer of right heel, unstageable: Secondary | ICD-10-CM | POA: Diagnosis not present

## 2018-05-23 DIAGNOSIS — I255 Ischemic cardiomyopathy: Secondary | ICD-10-CM | POA: Diagnosis not present

## 2018-05-23 DIAGNOSIS — I2510 Atherosclerotic heart disease of native coronary artery without angina pectoris: Secondary | ICD-10-CM | POA: Diagnosis not present

## 2018-05-23 DIAGNOSIS — E1122 Type 2 diabetes mellitus with diabetic chronic kidney disease: Secondary | ICD-10-CM | POA: Diagnosis not present

## 2018-05-24 DIAGNOSIS — D631 Anemia in chronic kidney disease: Secondary | ICD-10-CM | POA: Diagnosis not present

## 2018-05-24 DIAGNOSIS — Z992 Dependence on renal dialysis: Secondary | ICD-10-CM | POA: Diagnosis not present

## 2018-05-24 DIAGNOSIS — N186 End stage renal disease: Secondary | ICD-10-CM | POA: Diagnosis not present

## 2018-05-24 DIAGNOSIS — D509 Iron deficiency anemia, unspecified: Secondary | ICD-10-CM | POA: Diagnosis not present

## 2018-05-24 DIAGNOSIS — N2581 Secondary hyperparathyroidism of renal origin: Secondary | ICD-10-CM | POA: Diagnosis not present

## 2018-05-25 ENCOUNTER — Ambulatory Visit (HOSPITAL_COMMUNITY)
Admission: RE | Admit: 2018-05-25 | Discharge: 2018-05-25 | Disposition: A | Discharge status: Home / Self Care | Payer: Medicare Other | Source: Ambulatory Visit | Attending: Internal Medicine | Admitting: Internal Medicine

## 2018-05-25 ENCOUNTER — Other Ambulatory Visit (HOSPITAL_COMMUNITY): Payer: Self-pay | Admitting: Internal Medicine

## 2018-05-25 DIAGNOSIS — I517 Cardiomegaly: Secondary | ICD-10-CM | POA: Diagnosis not present

## 2018-05-25 DIAGNOSIS — I700 Atherosclerosis of aorta: Secondary | ICD-10-CM | POA: Diagnosis not present

## 2018-05-25 DIAGNOSIS — J90 Pleural effusion, not elsewhere classified: Secondary | ICD-10-CM | POA: Insufficient documentation

## 2018-05-25 DIAGNOSIS — R0600 Dyspnea, unspecified: Secondary | ICD-10-CM

## 2018-05-25 DIAGNOSIS — N185 Chronic kidney disease, stage 5: Secondary | ICD-10-CM | POA: Diagnosis not present

## 2018-05-25 DIAGNOSIS — R918 Other nonspecific abnormal finding of lung field: Secondary | ICD-10-CM | POA: Insufficient documentation

## 2018-05-25 DIAGNOSIS — Z6827 Body mass index (BMI) 27.0-27.9, adult: Secondary | ICD-10-CM | POA: Diagnosis not present

## 2018-05-25 DIAGNOSIS — R0602 Shortness of breath: Secondary | ICD-10-CM | POA: Diagnosis not present

## 2018-05-26 DIAGNOSIS — Z992 Dependence on renal dialysis: Secondary | ICD-10-CM | POA: Diagnosis not present

## 2018-05-26 DIAGNOSIS — D509 Iron deficiency anemia, unspecified: Secondary | ICD-10-CM | POA: Diagnosis not present

## 2018-05-26 DIAGNOSIS — N2581 Secondary hyperparathyroidism of renal origin: Secondary | ICD-10-CM | POA: Diagnosis not present

## 2018-05-26 DIAGNOSIS — N186 End stage renal disease: Secondary | ICD-10-CM | POA: Diagnosis not present

## 2018-05-26 DIAGNOSIS — D631 Anemia in chronic kidney disease: Secondary | ICD-10-CM | POA: Diagnosis not present

## 2018-05-29 DIAGNOSIS — D509 Iron deficiency anemia, unspecified: Secondary | ICD-10-CM | POA: Diagnosis not present

## 2018-05-29 DIAGNOSIS — D631 Anemia in chronic kidney disease: Secondary | ICD-10-CM | POA: Diagnosis not present

## 2018-05-29 DIAGNOSIS — Z992 Dependence on renal dialysis: Secondary | ICD-10-CM | POA: Diagnosis not present

## 2018-05-29 DIAGNOSIS — N186 End stage renal disease: Secondary | ICD-10-CM | POA: Diagnosis not present

## 2018-05-29 DIAGNOSIS — N2581 Secondary hyperparathyroidism of renal origin: Secondary | ICD-10-CM | POA: Diagnosis not present

## 2018-05-30 ENCOUNTER — Other Ambulatory Visit (HOSPITAL_COMMUNITY): Payer: Self-pay | Admitting: Student

## 2018-05-30 ENCOUNTER — Ambulatory Visit (INDEPENDENT_AMBULATORY_CARE_PROVIDER_SITE_OTHER): Payer: Medicare Other | Admitting: Pharmacist

## 2018-05-30 ENCOUNTER — Other Ambulatory Visit (HOSPITAL_COMMUNITY): Payer: Self-pay | Admitting: Internal Medicine

## 2018-05-30 ENCOUNTER — Encounter (HOSPITAL_COMMUNITY): Payer: Self-pay | Admitting: Student

## 2018-05-30 ENCOUNTER — Ambulatory Visit (HOSPITAL_COMMUNITY)
Admission: RE | Admit: 2018-05-30 | Discharge: 2018-05-30 | Disposition: A | Discharge status: Home / Self Care | Payer: Medicare Other | Source: Ambulatory Visit | Attending: Internal Medicine | Admitting: Internal Medicine

## 2018-05-30 ENCOUNTER — Ambulatory Visit (HOSPITAL_COMMUNITY)
Admission: RE | Admit: 2018-05-30 | Discharge: 2018-05-30 | Disposition: A | Discharge status: Home / Self Care | Payer: Medicare Other | Source: Ambulatory Visit | Attending: Student | Admitting: Student

## 2018-05-30 DIAGNOSIS — I2510 Atherosclerotic heart disease of native coronary artery without angina pectoris: Secondary | ICD-10-CM | POA: Diagnosis not present

## 2018-05-30 DIAGNOSIS — I255 Ischemic cardiomyopathy: Secondary | ICD-10-CM | POA: Diagnosis not present

## 2018-05-30 DIAGNOSIS — I482 Chronic atrial fibrillation, unspecified: Secondary | ICD-10-CM

## 2018-05-30 DIAGNOSIS — I120 Hypertensive chronic kidney disease with stage 5 chronic kidney disease or end stage renal disease: Secondary | ICD-10-CM | POA: Diagnosis not present

## 2018-05-30 DIAGNOSIS — L89610 Pressure ulcer of right heel, unstageable: Secondary | ICD-10-CM | POA: Diagnosis not present

## 2018-05-30 DIAGNOSIS — J90 Pleural effusion, not elsewhere classified: Secondary | ICD-10-CM | POA: Diagnosis not present

## 2018-05-30 DIAGNOSIS — Z9889 Other specified postprocedural states: Secondary | ICD-10-CM

## 2018-05-30 DIAGNOSIS — L89153 Pressure ulcer of sacral region, stage 3: Secondary | ICD-10-CM | POA: Diagnosis not present

## 2018-05-30 DIAGNOSIS — E1122 Type 2 diabetes mellitus with diabetic chronic kidney disease: Secondary | ICD-10-CM | POA: Diagnosis not present

## 2018-05-30 DIAGNOSIS — Z7901 Long term (current) use of anticoagulants: Secondary | ICD-10-CM

## 2018-05-30 HISTORY — PX: IR THORACENTESIS RIGHT ASP PLEURAL SPACE W/IMG GUIDE: IMG5380

## 2018-05-30 LAB — POCT INR: INR: 2.3 (ref 2.0–3.0)

## 2018-05-30 MED ORDER — LIDOCAINE HCL (PF) 2 % IJ SOLN
INTRAMUSCULAR | Status: AC
  Filled 2018-05-30: qty 20

## 2018-05-30 MED ORDER — LIDOCAINE HCL (PF) 2 % IJ SOLN
INTRAMUSCULAR | Status: DC | PRN
  Administered 2018-05-30: 10 mL

## 2018-05-30 NOTE — Procedures (Signed)
PROCEDURE SUMMARY:  Successful US guided therapeutic right thoracentesis. Yielded 1.3 liters of amber fluid. Pt tolerated procedure well. No immediate complications.  Specimen was saved but not sent for labs.  Attempting to confirm with ordering physician no labwork is needed.  CXR ordered.  Docia Barrier PA-C 05/30/2018 1:15 PM

## 2018-05-31 DIAGNOSIS — Z992 Dependence on renal dialysis: Secondary | ICD-10-CM | POA: Diagnosis not present

## 2018-05-31 DIAGNOSIS — N186 End stage renal disease: Secondary | ICD-10-CM | POA: Diagnosis not present

## 2018-05-31 DIAGNOSIS — D509 Iron deficiency anemia, unspecified: Secondary | ICD-10-CM | POA: Diagnosis not present

## 2018-05-31 DIAGNOSIS — D631 Anemia in chronic kidney disease: Secondary | ICD-10-CM | POA: Diagnosis not present

## 2018-05-31 DIAGNOSIS — N2581 Secondary hyperparathyroidism of renal origin: Secondary | ICD-10-CM | POA: Diagnosis not present

## 2018-06-01 ENCOUNTER — Encounter: Payer: Medicare Other | Admitting: Cardiothoracic Surgery

## 2018-06-02 DIAGNOSIS — N2581 Secondary hyperparathyroidism of renal origin: Secondary | ICD-10-CM | POA: Diagnosis not present

## 2018-06-02 DIAGNOSIS — D631 Anemia in chronic kidney disease: Secondary | ICD-10-CM | POA: Diagnosis not present

## 2018-06-02 DIAGNOSIS — Z992 Dependence on renal dialysis: Secondary | ICD-10-CM | POA: Diagnosis not present

## 2018-06-02 DIAGNOSIS — D509 Iron deficiency anemia, unspecified: Secondary | ICD-10-CM | POA: Diagnosis not present

## 2018-06-02 DIAGNOSIS — N186 End stage renal disease: Secondary | ICD-10-CM | POA: Diagnosis not present

## 2018-06-04 DIAGNOSIS — N186 End stage renal disease: Secondary | ICD-10-CM | POA: Diagnosis not present

## 2018-06-04 DIAGNOSIS — Z992 Dependence on renal dialysis: Secondary | ICD-10-CM | POA: Diagnosis not present

## 2018-06-05 DIAGNOSIS — D509 Iron deficiency anemia, unspecified: Secondary | ICD-10-CM | POA: Diagnosis not present

## 2018-06-05 DIAGNOSIS — D631 Anemia in chronic kidney disease: Secondary | ICD-10-CM | POA: Diagnosis not present

## 2018-06-05 DIAGNOSIS — Z992 Dependence on renal dialysis: Secondary | ICD-10-CM | POA: Diagnosis not present

## 2018-06-05 DIAGNOSIS — N2581 Secondary hyperparathyroidism of renal origin: Secondary | ICD-10-CM | POA: Diagnosis not present

## 2018-06-05 DIAGNOSIS — N186 End stage renal disease: Secondary | ICD-10-CM | POA: Diagnosis not present

## 2018-06-06 ENCOUNTER — Ambulatory Visit (INDEPENDENT_AMBULATORY_CARE_PROVIDER_SITE_OTHER): Payer: Medicare Other | Admitting: *Deleted

## 2018-06-06 DIAGNOSIS — I255 Ischemic cardiomyopathy: Secondary | ICD-10-CM | POA: Diagnosis not present

## 2018-06-06 DIAGNOSIS — L89153 Pressure ulcer of sacral region, stage 3: Secondary | ICD-10-CM | POA: Diagnosis not present

## 2018-06-06 DIAGNOSIS — E1122 Type 2 diabetes mellitus with diabetic chronic kidney disease: Secondary | ICD-10-CM | POA: Diagnosis not present

## 2018-06-06 DIAGNOSIS — I2510 Atherosclerotic heart disease of native coronary artery without angina pectoris: Secondary | ICD-10-CM | POA: Diagnosis not present

## 2018-06-06 DIAGNOSIS — I120 Hypertensive chronic kidney disease with stage 5 chronic kidney disease or end stage renal disease: Secondary | ICD-10-CM | POA: Diagnosis not present

## 2018-06-06 DIAGNOSIS — L89610 Pressure ulcer of right heel, unstageable: Secondary | ICD-10-CM | POA: Diagnosis not present

## 2018-06-06 DIAGNOSIS — I5022 Chronic systolic (congestive) heart failure: Secondary | ICD-10-CM

## 2018-06-07 ENCOUNTER — Encounter: Payer: Self-pay | Admitting: Cardiology

## 2018-06-07 DIAGNOSIS — D631 Anemia in chronic kidney disease: Secondary | ICD-10-CM | POA: Diagnosis not present

## 2018-06-07 DIAGNOSIS — N186 End stage renal disease: Secondary | ICD-10-CM | POA: Diagnosis not present

## 2018-06-07 DIAGNOSIS — D509 Iron deficiency anemia, unspecified: Secondary | ICD-10-CM | POA: Diagnosis not present

## 2018-06-07 DIAGNOSIS — Z992 Dependence on renal dialysis: Secondary | ICD-10-CM | POA: Diagnosis not present

## 2018-06-07 DIAGNOSIS — N2581 Secondary hyperparathyroidism of renal origin: Secondary | ICD-10-CM | POA: Diagnosis not present

## 2018-06-07 NOTE — Progress Notes (Signed)
Remote ICD transmission.   

## 2018-06-09 DIAGNOSIS — N2581 Secondary hyperparathyroidism of renal origin: Secondary | ICD-10-CM | POA: Diagnosis not present

## 2018-06-09 DIAGNOSIS — D509 Iron deficiency anemia, unspecified: Secondary | ICD-10-CM | POA: Diagnosis not present

## 2018-06-09 DIAGNOSIS — D631 Anemia in chronic kidney disease: Secondary | ICD-10-CM | POA: Diagnosis not present

## 2018-06-09 DIAGNOSIS — Z992 Dependence on renal dialysis: Secondary | ICD-10-CM | POA: Diagnosis not present

## 2018-06-09 DIAGNOSIS — N186 End stage renal disease: Secondary | ICD-10-CM | POA: Diagnosis not present

## 2018-06-12 DIAGNOSIS — N186 End stage renal disease: Secondary | ICD-10-CM | POA: Diagnosis not present

## 2018-06-12 DIAGNOSIS — D631 Anemia in chronic kidney disease: Secondary | ICD-10-CM | POA: Diagnosis not present

## 2018-06-12 DIAGNOSIS — D509 Iron deficiency anemia, unspecified: Secondary | ICD-10-CM | POA: Diagnosis not present

## 2018-06-12 DIAGNOSIS — N2581 Secondary hyperparathyroidism of renal origin: Secondary | ICD-10-CM | POA: Diagnosis not present

## 2018-06-12 DIAGNOSIS — Z992 Dependence on renal dialysis: Secondary | ICD-10-CM | POA: Diagnosis not present

## 2018-06-13 ENCOUNTER — Ambulatory Visit (INDEPENDENT_AMBULATORY_CARE_PROVIDER_SITE_OTHER): Payer: Medicare Other | Admitting: *Deleted

## 2018-06-13 ENCOUNTER — Telehealth: Payer: Self-pay | Admitting: Cardiology

## 2018-06-13 DIAGNOSIS — I482 Chronic atrial fibrillation, unspecified: Secondary | ICD-10-CM

## 2018-06-13 DIAGNOSIS — I120 Hypertensive chronic kidney disease with stage 5 chronic kidney disease or end stage renal disease: Secondary | ICD-10-CM | POA: Diagnosis not present

## 2018-06-13 DIAGNOSIS — Z5181 Encounter for therapeutic drug level monitoring: Secondary | ICD-10-CM

## 2018-06-13 DIAGNOSIS — I2510 Atherosclerotic heart disease of native coronary artery without angina pectoris: Secondary | ICD-10-CM | POA: Diagnosis not present

## 2018-06-13 DIAGNOSIS — I255 Ischemic cardiomyopathy: Secondary | ICD-10-CM | POA: Diagnosis not present

## 2018-06-13 DIAGNOSIS — E1122 Type 2 diabetes mellitus with diabetic chronic kidney disease: Secondary | ICD-10-CM | POA: Diagnosis not present

## 2018-06-13 DIAGNOSIS — L89610 Pressure ulcer of right heel, unstageable: Secondary | ICD-10-CM | POA: Diagnosis not present

## 2018-06-13 DIAGNOSIS — L89153 Pressure ulcer of sacral region, stage 3: Secondary | ICD-10-CM | POA: Diagnosis not present

## 2018-06-13 LAB — POCT INR: INR: 2.4 (ref 2.0–3.0)

## 2018-06-13 NOTE — Telephone Encounter (Signed)
INR   2.4  Call Sarah with [facility]   [phone]

## 2018-06-13 NOTE — Telephone Encounter (Signed)
Done.  See coumadin note. 

## 2018-06-13 NOTE — Patient Instructions (Signed)
Continue coumadin 1 tablet daily.  Recheck in 2 weeks Order given to [place]

## 2018-06-14 DIAGNOSIS — N186 End stage renal disease: Secondary | ICD-10-CM | POA: Diagnosis not present

## 2018-06-14 DIAGNOSIS — D509 Iron deficiency anemia, unspecified: Secondary | ICD-10-CM | POA: Diagnosis not present

## 2018-06-14 DIAGNOSIS — D631 Anemia in chronic kidney disease: Secondary | ICD-10-CM | POA: Diagnosis not present

## 2018-06-14 DIAGNOSIS — Z992 Dependence on renal dialysis: Secondary | ICD-10-CM | POA: Diagnosis not present

## 2018-06-14 DIAGNOSIS — N2581 Secondary hyperparathyroidism of renal origin: Secondary | ICD-10-CM | POA: Diagnosis not present

## 2018-06-16 DIAGNOSIS — N186 End stage renal disease: Secondary | ICD-10-CM | POA: Diagnosis not present

## 2018-06-16 DIAGNOSIS — D631 Anemia in chronic kidney disease: Secondary | ICD-10-CM | POA: Diagnosis not present

## 2018-06-16 DIAGNOSIS — N2581 Secondary hyperparathyroidism of renal origin: Secondary | ICD-10-CM | POA: Diagnosis not present

## 2018-06-16 DIAGNOSIS — D509 Iron deficiency anemia, unspecified: Secondary | ICD-10-CM | POA: Diagnosis not present

## 2018-06-16 DIAGNOSIS — Z992 Dependence on renal dialysis: Secondary | ICD-10-CM | POA: Diagnosis not present

## 2018-06-18 DIAGNOSIS — E1122 Type 2 diabetes mellitus with diabetic chronic kidney disease: Secondary | ICD-10-CM | POA: Diagnosis not present

## 2018-06-18 DIAGNOSIS — I255 Ischemic cardiomyopathy: Secondary | ICD-10-CM | POA: Diagnosis not present

## 2018-06-18 DIAGNOSIS — L89153 Pressure ulcer of sacral region, stage 3: Secondary | ICD-10-CM | POA: Diagnosis not present

## 2018-06-18 DIAGNOSIS — I2510 Atherosclerotic heart disease of native coronary artery without angina pectoris: Secondary | ICD-10-CM | POA: Diagnosis not present

## 2018-06-19 DIAGNOSIS — D631 Anemia in chronic kidney disease: Secondary | ICD-10-CM | POA: Diagnosis not present

## 2018-06-19 DIAGNOSIS — D509 Iron deficiency anemia, unspecified: Secondary | ICD-10-CM | POA: Diagnosis not present

## 2018-06-19 DIAGNOSIS — Z992 Dependence on renal dialysis: Secondary | ICD-10-CM | POA: Diagnosis not present

## 2018-06-19 DIAGNOSIS — N2581 Secondary hyperparathyroidism of renal origin: Secondary | ICD-10-CM | POA: Diagnosis not present

## 2018-06-19 DIAGNOSIS — N186 End stage renal disease: Secondary | ICD-10-CM | POA: Diagnosis not present

## 2018-06-20 DIAGNOSIS — L89153 Pressure ulcer of sacral region, stage 3: Secondary | ICD-10-CM | POA: Diagnosis not present

## 2018-06-20 DIAGNOSIS — N185 Chronic kidney disease, stage 5: Secondary | ICD-10-CM | POA: Diagnosis not present

## 2018-06-20 DIAGNOSIS — I255 Ischemic cardiomyopathy: Secondary | ICD-10-CM | POA: Diagnosis not present

## 2018-06-20 DIAGNOSIS — I120 Hypertensive chronic kidney disease with stage 5 chronic kidney disease or end stage renal disease: Secondary | ICD-10-CM | POA: Diagnosis not present

## 2018-06-20 DIAGNOSIS — I2510 Atherosclerotic heart disease of native coronary artery without angina pectoris: Secondary | ICD-10-CM | POA: Diagnosis not present

## 2018-06-20 DIAGNOSIS — E1122 Type 2 diabetes mellitus with diabetic chronic kidney disease: Secondary | ICD-10-CM | POA: Diagnosis not present

## 2018-06-21 DIAGNOSIS — N2581 Secondary hyperparathyroidism of renal origin: Secondary | ICD-10-CM | POA: Diagnosis not present

## 2018-06-21 DIAGNOSIS — Z992 Dependence on renal dialysis: Secondary | ICD-10-CM | POA: Diagnosis not present

## 2018-06-21 DIAGNOSIS — N186 End stage renal disease: Secondary | ICD-10-CM | POA: Diagnosis not present

## 2018-06-21 DIAGNOSIS — D631 Anemia in chronic kidney disease: Secondary | ICD-10-CM | POA: Diagnosis not present

## 2018-06-21 DIAGNOSIS — D509 Iron deficiency anemia, unspecified: Secondary | ICD-10-CM | POA: Diagnosis not present

## 2018-06-23 DIAGNOSIS — D631 Anemia in chronic kidney disease: Secondary | ICD-10-CM | POA: Diagnosis not present

## 2018-06-23 DIAGNOSIS — N186 End stage renal disease: Secondary | ICD-10-CM | POA: Diagnosis not present

## 2018-06-23 DIAGNOSIS — N2581 Secondary hyperparathyroidism of renal origin: Secondary | ICD-10-CM | POA: Diagnosis not present

## 2018-06-23 DIAGNOSIS — Z992 Dependence on renal dialysis: Secondary | ICD-10-CM | POA: Diagnosis not present

## 2018-06-23 DIAGNOSIS — D509 Iron deficiency anemia, unspecified: Secondary | ICD-10-CM | POA: Diagnosis not present

## 2018-06-24 LAB — CUP PACEART REMOTE DEVICE CHECK
Battery Remaining Longevity: 42 mo
HIGH POWER IMPEDANCE MEASURED VALUE: 31 Ohm
HighPow Impedance: 31 Ohm
Implantable Lead Implant Date: 20091026
Implantable Lead Location: 753859
Implantable Lead Model: 4196
Implantable Lead Serial Number: 125581
Lead Channel Impedance Value: 350 Ohm
Lead Channel Impedance Value: 390 Ohm
Lead Channel Pacing Threshold Amplitude: 1 V
Lead Channel Pacing Threshold Pulse Width: 0.5 ms
Lead Channel Pacing Threshold Pulse Width: 0.8 ms
Lead Channel Sensing Intrinsic Amplitude: 9.7 mV
Lead Channel Setting Pacing Pulse Width: 0.5 ms
Lead Channel Setting Pacing Pulse Width: 0.8 ms
Lead Channel Setting Sensing Sensitivity: 2 mV
MDC IDC LEAD IMPLANT DT: 20020906
MDC IDC LEAD IMPLANT DT: 20091026
MDC IDC LEAD LOCATION: 753858
MDC IDC LEAD LOCATION: 753860
MDC IDC MSMT BATTERY REMAINING PERCENTAGE: 54 %
MDC IDC MSMT BATTERY VOLTAGE: 2.98 V
MDC IDC MSMT LEADCHNL LV PACING THRESHOLD AMPLITUDE: 0.75 V
MDC IDC MSMT LEADCHNL RA SENSING INTR AMPL: 1 mV
MDC IDC MSMT LEADCHNL RV IMPEDANCE VALUE: 540 Ohm
MDC IDC PG IMPLANT DT: 20160505
MDC IDC PG SERIAL: 7238042
MDC IDC SESS DTM: 20190702060035
MDC IDC SET LEADCHNL LV PACING AMPLITUDE: 2 V
MDC IDC SET LEADCHNL RV PACING AMPLITUDE: 2.5 V

## 2018-06-26 DIAGNOSIS — N2581 Secondary hyperparathyroidism of renal origin: Secondary | ICD-10-CM | POA: Diagnosis not present

## 2018-06-26 DIAGNOSIS — Z992 Dependence on renal dialysis: Secondary | ICD-10-CM | POA: Diagnosis not present

## 2018-06-26 DIAGNOSIS — N186 End stage renal disease: Secondary | ICD-10-CM | POA: Diagnosis not present

## 2018-06-26 DIAGNOSIS — D631 Anemia in chronic kidney disease: Secondary | ICD-10-CM | POA: Diagnosis not present

## 2018-06-26 DIAGNOSIS — D509 Iron deficiency anemia, unspecified: Secondary | ICD-10-CM | POA: Diagnosis not present

## 2018-06-27 ENCOUNTER — Inpatient Hospital Stay (HOSPITAL_COMMUNITY)
Admission: EM | Admit: 2018-06-27 | Discharge: 2018-07-01 | DRG: 291 | Disposition: A | Discharge status: Home Health | Payer: Medicare Other | Attending: Internal Medicine | Admitting: Internal Medicine

## 2018-06-27 ENCOUNTER — Ambulatory Visit (INDEPENDENT_AMBULATORY_CARE_PROVIDER_SITE_OTHER): Payer: Medicare Other | Admitting: *Deleted

## 2018-06-27 ENCOUNTER — Other Ambulatory Visit: Payer: Self-pay

## 2018-06-27 ENCOUNTER — Encounter (HOSPITAL_COMMUNITY): Payer: Self-pay | Admitting: Emergency Medicine

## 2018-06-27 ENCOUNTER — Emergency Department (HOSPITAL_COMMUNITY): Payer: Medicare Other

## 2018-06-27 ENCOUNTER — Encounter: Payer: Self-pay | Admitting: Internal Medicine

## 2018-06-27 ENCOUNTER — Ambulatory Visit (INDEPENDENT_AMBULATORY_CARE_PROVIDER_SITE_OTHER): Payer: Medicare Other | Admitting: Internal Medicine

## 2018-06-27 VITALS — BP 102/60 | HR 73 | Ht 70.0 in | Wt 180.0 lb

## 2018-06-27 DIAGNOSIS — I132 Hypertensive heart and chronic kidney disease with heart failure and with stage 5 chronic kidney disease, or end stage renal disease: Secondary | ICD-10-CM | POA: Diagnosis not present

## 2018-06-27 DIAGNOSIS — Z8673 Personal history of transient ischemic attack (TIA), and cerebral infarction without residual deficits: Secondary | ICD-10-CM | POA: Diagnosis not present

## 2018-06-27 DIAGNOSIS — Z5181 Encounter for therapeutic drug level monitoring: Secondary | ICD-10-CM

## 2018-06-27 DIAGNOSIS — Z933 Colostomy status: Secondary | ICD-10-CM

## 2018-06-27 DIAGNOSIS — Z7901 Long term (current) use of anticoagulants: Secondary | ICD-10-CM

## 2018-06-27 DIAGNOSIS — J90 Pleural effusion, not elsewhere classified: Secondary | ICD-10-CM | POA: Diagnosis not present

## 2018-06-27 DIAGNOSIS — E1122 Type 2 diabetes mellitus with diabetic chronic kidney disease: Secondary | ICD-10-CM | POA: Diagnosis present

## 2018-06-27 DIAGNOSIS — Z79891 Long term (current) use of opiate analgesic: Secondary | ICD-10-CM

## 2018-06-27 DIAGNOSIS — Z9861 Coronary angioplasty status: Secondary | ICD-10-CM | POA: Diagnosis not present

## 2018-06-27 DIAGNOSIS — D696 Thrombocytopenia, unspecified: Secondary | ICD-10-CM | POA: Diagnosis present

## 2018-06-27 DIAGNOSIS — I480 Paroxysmal atrial fibrillation: Secondary | ICD-10-CM | POA: Diagnosis present

## 2018-06-27 DIAGNOSIS — K219 Gastro-esophageal reflux disease without esophagitis: Secondary | ICD-10-CM | POA: Diagnosis present

## 2018-06-27 DIAGNOSIS — I5022 Chronic systolic (congestive) heart failure: Secondary | ICD-10-CM | POA: Diagnosis present

## 2018-06-27 DIAGNOSIS — I252 Old myocardial infarction: Secondary | ICD-10-CM | POA: Diagnosis not present

## 2018-06-27 DIAGNOSIS — Z79899 Other long term (current) drug therapy: Secondary | ICD-10-CM

## 2018-06-27 DIAGNOSIS — Z8546 Personal history of malignant neoplasm of prostate: Secondary | ICD-10-CM

## 2018-06-27 DIAGNOSIS — Z86010 Personal history of colonic polyps: Secondary | ICD-10-CM

## 2018-06-27 DIAGNOSIS — I2510 Atherosclerotic heart disease of native coronary artery without angina pectoris: Secondary | ICD-10-CM | POA: Diagnosis present

## 2018-06-27 DIAGNOSIS — E1165 Type 2 diabetes mellitus with hyperglycemia: Secondary | ICD-10-CM | POA: Diagnosis not present

## 2018-06-27 DIAGNOSIS — I482 Chronic atrial fibrillation, unspecified: Secondary | ICD-10-CM

## 2018-06-27 DIAGNOSIS — R0603 Acute respiratory distress: Secondary | ICD-10-CM | POA: Diagnosis present

## 2018-06-27 DIAGNOSIS — Z992 Dependence on renal dialysis: Secondary | ICD-10-CM | POA: Diagnosis not present

## 2018-06-27 DIAGNOSIS — K7460 Unspecified cirrhosis of liver: Secondary | ICD-10-CM | POA: Diagnosis present

## 2018-06-27 DIAGNOSIS — L89153 Pressure ulcer of sacral region, stage 3: Secondary | ICD-10-CM | POA: Diagnosis present

## 2018-06-27 DIAGNOSIS — I120 Hypertensive chronic kidney disease with stage 5 chronic kidney disease or end stage renal disease: Secondary | ICD-10-CM | POA: Diagnosis not present

## 2018-06-27 DIAGNOSIS — G4733 Obstructive sleep apnea (adult) (pediatric): Secondary | ICD-10-CM | POA: Diagnosis present

## 2018-06-27 DIAGNOSIS — L8990 Pressure ulcer of unspecified site, unspecified stage: Secondary | ICD-10-CM

## 2018-06-27 DIAGNOSIS — N185 Chronic kidney disease, stage 5: Secondary | ICD-10-CM | POA: Diagnosis not present

## 2018-06-27 DIAGNOSIS — E1129 Type 2 diabetes mellitus with other diabetic kidney complication: Secondary | ICD-10-CM | POA: Diagnosis present

## 2018-06-27 DIAGNOSIS — I5023 Acute on chronic systolic (congestive) heart failure: Secondary | ICD-10-CM | POA: Diagnosis present

## 2018-06-27 DIAGNOSIS — Z85048 Personal history of other malignant neoplasm of rectum, rectosigmoid junction, and anus: Secondary | ICD-10-CM

## 2018-06-27 DIAGNOSIS — R079 Chest pain, unspecified: Secondary | ICD-10-CM | POA: Diagnosis not present

## 2018-06-27 DIAGNOSIS — Z8249 Family history of ischemic heart disease and other diseases of the circulatory system: Secondary | ICD-10-CM

## 2018-06-27 DIAGNOSIS — N186 End stage renal disease: Secondary | ICD-10-CM

## 2018-06-27 DIAGNOSIS — I9589 Other hypotension: Secondary | ICD-10-CM | POA: Diagnosis present

## 2018-06-27 DIAGNOSIS — M899 Disorder of bone, unspecified: Secondary | ICD-10-CM | POA: Diagnosis present

## 2018-06-27 DIAGNOSIS — Z833 Family history of diabetes mellitus: Secondary | ICD-10-CM

## 2018-06-27 DIAGNOSIS — E785 Hyperlipidemia, unspecified: Secondary | ICD-10-CM | POA: Diagnosis present

## 2018-06-27 DIAGNOSIS — D631 Anemia in chronic kidney disease: Secondary | ICD-10-CM | POA: Diagnosis present

## 2018-06-27 DIAGNOSIS — Z9889 Other specified postprocedural states: Secondary | ICD-10-CM

## 2018-06-27 DIAGNOSIS — Z800 Family history of malignant neoplasm of digestive organs: Secondary | ICD-10-CM | POA: Diagnosis not present

## 2018-06-27 DIAGNOSIS — E039 Hypothyroidism, unspecified: Secondary | ICD-10-CM | POA: Diagnosis present

## 2018-06-27 DIAGNOSIS — R0602 Shortness of breath: Secondary | ICD-10-CM | POA: Diagnosis not present

## 2018-06-27 DIAGNOSIS — D638 Anemia in other chronic diseases classified elsewhere: Secondary | ICD-10-CM | POA: Diagnosis present

## 2018-06-27 DIAGNOSIS — R0789 Other chest pain: Secondary | ICD-10-CM | POA: Diagnosis not present

## 2018-06-27 DIAGNOSIS — Z95810 Presence of automatic (implantable) cardiac defibrillator: Secondary | ICD-10-CM | POA: Diagnosis not present

## 2018-06-27 DIAGNOSIS — Z66 Do not resuscitate: Secondary | ICD-10-CM | POA: Diagnosis present

## 2018-06-27 DIAGNOSIS — I959 Hypotension, unspecified: Secondary | ICD-10-CM | POA: Diagnosis not present

## 2018-06-27 DIAGNOSIS — I509 Heart failure, unspecified: Secondary | ICD-10-CM

## 2018-06-27 DIAGNOSIS — Z7982 Long term (current) use of aspirin: Secondary | ICD-10-CM

## 2018-06-27 DIAGNOSIS — I255 Ischemic cardiomyopathy: Secondary | ICD-10-CM | POA: Diagnosis present

## 2018-06-27 DIAGNOSIS — Z79890 Hormone replacement therapy (postmenopausal): Secondary | ICD-10-CM

## 2018-06-27 DIAGNOSIS — R0902 Hypoxemia: Secondary | ICD-10-CM | POA: Diagnosis not present

## 2018-06-27 DIAGNOSIS — IMO0002 Reserved for concepts with insufficient information to code with codable children: Secondary | ICD-10-CM | POA: Diagnosis present

## 2018-06-27 DIAGNOSIS — R0689 Other abnormalities of breathing: Secondary | ICD-10-CM | POA: Diagnosis not present

## 2018-06-27 DIAGNOSIS — Z794 Long term (current) use of insulin: Secondary | ICD-10-CM

## 2018-06-27 LAB — CUP PACEART INCLINIC DEVICE CHECK
Battery Remaining Longevity: 42 mo
Brady Statistic RA Percent Paced: 0 %
Brady Statistic RV Percent Paced: 96 %
Date Time Interrogation Session: 20190723093836
HighPow Impedance: 31.5 Ohm
Implantable Lead Implant Date: 20020906
Implantable Lead Implant Date: 20091026
Implantable Lead Location: 753858
Implantable Lead Location: 753859
Implantable Lead Location: 753860
Implantable Lead Model: 148
Implantable Lead Model: 4196
Lead Channel Impedance Value: 350 Ohm
Lead Channel Impedance Value: 387.5 Ohm
Lead Channel Impedance Value: 550 Ohm
Lead Channel Pacing Threshold Amplitude: 0.5 V
Lead Channel Pacing Threshold Amplitude: 0.75 V
Lead Channel Pacing Threshold Amplitude: 0.75 V
Lead Channel Pacing Threshold Amplitude: 1 V
Lead Channel Pacing Threshold Pulse Width: 0.5 ms
Lead Channel Pacing Threshold Pulse Width: 0.5 ms
Lead Channel Pacing Threshold Pulse Width: 0.8 ms
Lead Channel Pacing Threshold Pulse Width: 0.8 ms
Lead Channel Sensing Intrinsic Amplitude: 8.9 mV
Lead Channel Setting Pacing Amplitude: 2 V
Lead Channel Setting Pacing Amplitude: 2.5 V
Lead Channel Setting Pacing Pulse Width: 0.5 ms
Lead Channel Setting Pacing Pulse Width: 0.8 ms
Lead Channel Setting Sensing Sensitivity: 2 mV
MDC IDC LEAD IMPLANT DT: 20091026
MDC IDC LEAD SERIAL: 125581
MDC IDC MSMT LEADCHNL LV PACING THRESHOLD PULSEWIDTH: 0.5 ms
MDC IDC MSMT LEADCHNL RA SENSING INTR AMPL: 1 mV
MDC IDC MSMT LEADCHNL RV PACING THRESHOLD AMPLITUDE: 1 V
MDC IDC PG IMPLANT DT: 20160505
Pulse Gen Serial Number: 7238042

## 2018-06-27 LAB — COMPREHENSIVE METABOLIC PANEL
ALBUMIN: 2.7 g/dL — AB (ref 3.5–5.0)
ALK PHOS: 179 U/L — AB (ref 38–126)
ALT: 15 U/L (ref 0–44)
ANION GAP: 9 (ref 5–15)
AST: 25 U/L (ref 15–41)
BUN: 41 mg/dL — ABNORMAL HIGH (ref 8–23)
CO2: 27 mmol/L (ref 22–32)
Calcium: 8.7 mg/dL — ABNORMAL LOW (ref 8.9–10.3)
Chloride: 98 mmol/L (ref 98–111)
Creatinine, Ser: 2.83 mg/dL — ABNORMAL HIGH (ref 0.61–1.24)
GFR calc non Af Amer: 22 mL/min — ABNORMAL LOW (ref >60)
GFR, EST AFRICAN AMERICAN: 26 mL/min — AB (ref >60)
GLUCOSE: 159 mg/dL — AB (ref 70–99)
POTASSIUM: 4 mmol/L (ref 3.5–5.1)
SODIUM: 134 mmol/L — AB (ref 135–145)
Total Bilirubin: 2 mg/dL — ABNORMAL HIGH (ref 0.3–1.2)
Total Protein: 6.4 g/dL — ABNORMAL LOW (ref 6.5–8.1)

## 2018-06-27 LAB — CBC WITH DIFFERENTIAL/PLATELET
BASOS ABS: 0 10*3/uL (ref 0.0–0.1)
BASOS PCT: 0 %
EOS PCT: 1 %
Eosinophils Absolute: 0.1 10*3/uL (ref 0.0–0.7)
HCT: 34.8 % — ABNORMAL LOW (ref 39.0–52.0)
Hemoglobin: 11.3 g/dL — ABNORMAL LOW (ref 13.0–17.0)
LYMPHS PCT: 4 %
Lymphs Abs: 0.3 10*3/uL — ABNORMAL LOW (ref 0.7–4.0)
MCH: 36.2 pg — ABNORMAL HIGH (ref 26.0–34.0)
MCHC: 32.5 g/dL (ref 30.0–36.0)
MCV: 111.5 fL — AB (ref 78.0–100.0)
MONO ABS: 0.4 10*3/uL (ref 0.1–1.0)
Monocytes Relative: 6 %
Neutro Abs: 6.2 10*3/uL (ref 1.7–7.7)
Neutrophils Relative %: 89 %
PLATELETS: 81 10*3/uL — AB (ref 150–400)
RBC: 3.12 MIL/uL — ABNORMAL LOW (ref 4.22–5.81)
RDW: 15.5 % (ref 11.5–15.5)
WBC: 7 10*3/uL (ref 4.0–10.5)

## 2018-06-27 LAB — BRAIN NATRIURETIC PEPTIDE: B Natriuretic Peptide: 2606 pg/mL — ABNORMAL HIGH (ref 0.0–100.0)

## 2018-06-27 LAB — AMMONIA: AMMONIA: 23 umol/L (ref 9–35)

## 2018-06-27 LAB — PROTIME-INR
INR: 2.85
Prothrombin Time: 29.7 seconds — ABNORMAL HIGH (ref 11.4–15.2)

## 2018-06-27 LAB — I-STAT TROPONIN, ED: TROPONIN I, POC: 0 ng/mL (ref 0.00–0.08)

## 2018-06-27 LAB — POCT INR: INR: 3.3 — AB (ref 2.0–3.0)

## 2018-06-27 MED ORDER — FLUTICASONE PROPIONATE 50 MCG/ACT NA SUSP
2.0000 | Freq: Every day | NASAL | Status: DC
  Administered 2018-06-28 – 2018-07-01 (×4): 2 via NASAL
  Filled 2018-06-27: qty 16

## 2018-06-27 MED ORDER — MORPHINE SULFATE (PF) 2 MG/ML IV SOLN
2.0000 mg | Freq: Once | INTRAVENOUS | Status: DC
  Filled 2018-06-27: qty 1

## 2018-06-27 MED ORDER — ONDANSETRON HCL 4 MG/2ML IJ SOLN: 4.0000 mg | Freq: Four times a day (QID) | INTRAMUSCULAR | Status: DC | PRN

## 2018-06-27 MED ORDER — NITROGLYCERIN 0.4 MG SL SUBL: 0.4000 mg | SUBLINGUAL_TABLET | SUBLINGUAL | Status: DC | PRN

## 2018-06-27 MED ORDER — ONDANSETRON HCL 4 MG PO TABS: 4.0000 mg | ORAL_TABLET | Freq: Four times a day (QID) | ORAL | Status: DC | PRN

## 2018-06-27 MED ORDER — INSULIN ASPART 100 UNIT/ML ~~LOC~~ SOLN
0.0000 [IU] | SUBCUTANEOUS | Status: DC
  Administered 2018-06-28: 1 [IU] via SUBCUTANEOUS

## 2018-06-27 MED ORDER — LORATADINE 10 MG PO TABS
10.0000 mg | ORAL_TABLET | Freq: Every day | ORAL | Status: DC
  Administered 2018-06-28 – 2018-07-01 (×4): 10 mg via ORAL
  Filled 2018-06-27 (×4): qty 1

## 2018-06-27 MED ORDER — SODIUM CHLORIDE 0.9% FLUSH: 3.0000 mL | INTRAVENOUS | Status: DC | PRN

## 2018-06-27 MED ORDER — PANTOPRAZOLE SODIUM 40 MG PO TBEC
40.0000 mg | DELAYED_RELEASE_TABLET | Freq: Every day | ORAL | Status: DC
  Administered 2018-06-28 – 2018-07-01 (×4): 40 mg via ORAL
  Filled 2018-06-27 (×4): qty 1

## 2018-06-27 MED ORDER — MIDODRINE HCL 5 MG PO TABS
15.0000 mg | ORAL_TABLET | Freq: Two times a day (BID) | ORAL | Status: DC
  Administered 2018-06-28 – 2018-07-01 (×7): 15 mg via ORAL
  Filled 2018-06-27 (×7): qty 3

## 2018-06-27 MED ORDER — ACETAMINOPHEN 650 MG RE SUPP: 650.0000 mg | Freq: Four times a day (QID) | RECTAL | Status: DC | PRN

## 2018-06-27 MED ORDER — INSULIN ASPART 100 UNIT/ML ~~LOC~~ SOLN: 0.0000 [IU] | Freq: Every day | SUBCUTANEOUS | Status: DC

## 2018-06-27 MED ORDER — SEVELAMER CARBONATE 800 MG PO TABS
1600.0000 mg | ORAL_TABLET | Freq: Three times a day (TID) | ORAL | Status: DC
  Administered 2018-06-28: 1600 mg via ORAL
  Filled 2018-06-27: qty 2

## 2018-06-27 MED ORDER — PHYTONADIONE 1 MG/0.5 ML ORAL SOLUTION
1.0000 mg | Freq: Once | ORAL | Status: AC
  Administered 2018-06-28: 1 mg via ORAL
  Filled 2018-06-27 (×2): qty 0.5

## 2018-06-27 MED ORDER — SODIUM CHLORIDE 0.9 % IV SOLN: 250.0000 mL | INTRAVENOUS | Status: DC | PRN

## 2018-06-27 MED ORDER — ACETAMINOPHEN 325 MG PO TABS
650.0000 mg | ORAL_TABLET | Freq: Four times a day (QID) | ORAL | Status: DC | PRN
  Administered 2018-06-28 – 2018-07-01 (×7): 650 mg via ORAL
  Filled 2018-06-27 (×7): qty 2

## 2018-06-27 MED ORDER — ALPRAZOLAM 0.5 MG PO TABS
0.5000 mg | ORAL_TABLET | Freq: Three times a day (TID) | ORAL | Status: DC | PRN
  Administered 2018-06-29 – 2018-06-30 (×2): 0.5 mg via ORAL
  Filled 2018-06-27 (×2): qty 1

## 2018-06-27 MED ORDER — OXYCODONE HCL 5 MG PO TABS
10.0000 mg | ORAL_TABLET | ORAL | Status: DC | PRN
  Filled 2018-06-27: qty 2

## 2018-06-27 MED ORDER — SUCROFERRIC OXYHYDROXIDE 500 MG PO CHEW
500.0000 mg | CHEWABLE_TABLET | Freq: Three times a day (TID) | ORAL | Status: DC
  Administered 2018-06-28 – 2018-07-01 (×7): 500 mg via ORAL
  Filled 2018-06-27 (×15): qty 1

## 2018-06-27 MED ORDER — SODIUM CHLORIDE 0.9% FLUSH
3.0000 mL | Freq: Two times a day (BID) | INTRAVENOUS | Status: DC
  Administered 2018-06-28 – 2018-07-01 (×7): 3 mL via INTRAVENOUS

## 2018-06-27 MED ORDER — LEVOTHYROXINE SODIUM 75 MCG PO TABS
75.0000 ug | ORAL_TABLET | Freq: Every day | ORAL | Status: DC
  Administered 2018-06-28 – 2018-07-01 (×4): 75 ug via ORAL
  Filled 2018-06-27: qty 1.5
  Filled 2018-06-27: qty 3
  Filled 2018-06-27: qty 1
  Filled 2018-06-27: qty 3
  Filled 2018-06-27 (×2): qty 1.5
  Filled 2018-06-27: qty 1

## 2018-06-27 MED ORDER — ATORVASTATIN CALCIUM 10 MG PO TABS
10.0000 mg | ORAL_TABLET | Freq: Every day | ORAL | Status: DC
  Administered 2018-06-28 – 2018-07-01 (×4): 10 mg via ORAL
  Filled 2018-06-27 (×4): qty 1

## 2018-06-27 NOTE — ED Triage Notes (Signed)
Pt c/o sob and states when he takes a deep breath it hurts. Last dialysis was Monday.

## 2018-06-27 NOTE — Patient Instructions (Signed)
Hold coumadin tonight then resume 1 tablet daily.  Recheck in 2 weeks Order given to [place]

## 2018-06-27 NOTE — ED Provider Notes (Signed)
[facility] EMERGENCY DEPARTMENT Provider Note   CSN: [id number] Arrival date & time: 06/27/18  1831     History   Chief Complaint Chief Complaint  Patient presents with  . Shortness of Breath    HPI [name] is a 62 y.o. male.  Pt presents to the ED today with sob and pain with inspiration.  Pt has a hx of CHF, CM, DM2, ESRD on HD.  The pt gets dialysis on MWF.  He has not skipped any days of dialysis.  He did see his cardiologist today.  Everything was ok and he was instructed to f/u again in 1 year.  Pt denies f/c.  Per EMS, his blood pressure dropped into the 80s en route.  They did give him about 200 cc of NS.  Per pt's wife, pt has had prior thoracentesis procedures.  First was on 09/04/17 and next was 11/07/17.  Last was on 6/25.  He has been doing well since then.       Past Medical History:  Diagnosis Date  . Adenocarcinoma of rectum ([place])    a. 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 AICD (automatic cardioverter/defibrillator) present 2002   BI V ICD  . Anemia   . CAD (coronary artery disease)    a. BMS to LAD 2001 at [facility] b. PTCA/atherectomy ramus and BMS to LAD 2009  . CHF (congestive heart failure) ([place])   . Cholelithiasis 06/2015  . Chronic kidney disease, stage IV (severe) ([place])   . Chronic systolic heart failure ([place])   . Cirrhosis ([place])   . Colostomy in place [facility])   . Dizziness    a. chronic. Admission for this 07/18/2014  . DM type 2, uncontrolled, with renal complications ([place])   . Dysrhythmia   . Essential hypertension, benign   . GERD (gastroesophageal reflux disease)   . HCAP (healthcare-associated pneumonia) 07/21/2015  . Hematuria   . History of blood transfusion    "I've had 2 units so far this year" (09/27/2015)  . HLD (hyperlipidemia)   . Ischemic cardiomyopathy    EF 18% by nuclear study 2016, multiple myocardial infarctions in past    . Myocardial infarction ([place]) 2001  . Obesity   . Orthostatic hypotension   . OSA on CPAP   . Paroxysmal atrial  fibrillation (HCC)    a. on amiodarone, digoxin and Eliquis  . PONV (postoperative nausea and vomiting)   . Presence of permanent cardiac pacemaker   . Prostate cancer ([place])    a. s/p seed implants with chemo and radiation  . SAH (subarachnoid hemorrhage) ([place])    post-traumatic (fall) [facility] 12/2014  . TIA (transient ischemic attack)     Patient Active Problem List   Diagnosis Date Noted  . Malnutrition of moderate degree 09/06/2017  . Status post thoracentesis   . Colostomy in place [facility])   . ESRD on dialysis ([place])   . Hyponatremia 09/03/2017  . Hemoptysis 09/03/2017  . Hypoglycemia   . Severe protein-calorie malnutrition ([place])   . Acute blood loss anemia   . Type 2 diabetes mellitus with peripheral neuropathy (HCC)   . Chronic systolic heart failure ([place])   . Colostomy care ([place])   . Chronic kidney disease (CKD), stage IV (severe) ([place])   . Dependence on renal dialysis ([place])   . Wounds, multiple   . Supratherapeutic INR   . MSSA bacteremia 08/02/2017  . ESRD (end stage renal disease) ([place])   . Debility   . Acute on chronic systolic CHF (  congestive heart failure) ([place])   . Bacteremia due to methicillin susceptible Staphylococcus aureus (MSSA)   . Infected defibrillator ([place])   . Septic hip ([place])   . Staphylococcus aureus bacteremia with sepsis ([place])   . Severe sepsis with septic shock ([place])   . Cardiogenic shock ([place]) 07/10/2017  . Septic shock ([place]) 07/10/2017  . Cardiorenal syndrome with renal failure   . Thrombocytopenia ([place]) 06/14/2017  . GI bleed 04/27/2017  . Portal hypertensive gastropathy ([place]) 04/27/2017  . Hypokalemia 04/27/2017  . Esophageal dysphagia 04/13/2017  . Hepatic cirrhosis ([place]) 02/24/2017  . Acute on chronic systolic heart failure ([place])   . Biliary disease with obstruction   . AKI (acute kidney injury) ([place])   . Abnormal LFTs 05/10/2016  . Pain in the chest   . Right heart failure ([place]) 09/26/2015  . History of colonic polyps 09/24/2015  .  Chronic atrial fibrillation ([place])   . Bleeding from colostomy stoma ([place]) 08/23/2015  . Anemia of chronic disease 07/21/2015  . HCAP (healthcare-associated pneumonia) 07/21/2015  . Constipation 05/01/2015  . Transaminitis 05/01/2015  . NSTEMI- 04/23/15- Myoview scar- no cath 04/27/2015  . Acute respiratory failure ([place]) 04/23/2015  . Chronic cholecystitis 04/04/2015  . Cystitis with hematuria-April 2016 04/04/2015  . Chronically elevated transaminase level-Amiodarone resumed 04/24/15 04/03/2015  . H/O ventricular fibrillation-March 2016 02/24/2015  . BiV ICD (St Jude) gen change 04/10/15 02/24/2015  . Hyperlipidemia   . Dietary noncompliance   . Orthostatic hypotension 07/19/2014  . OSA on CPAP   . GERD (gastroesophageal reflux disease) 06/05/2014  . Gallbladder polyp 03/06/2014  . DM type 2, uncontrolled, with renal complications ([place]) 26/83/4196  . Dizziness 01/26/2014  . Obesity 12/12/2013  . Chronic hypotension 12/11/2013  . Chronic kidney disease, stage IV (severe) ([place]) 12/11/2013  . Chronic a-fib ([place])   . CAD with LCX disease and stents to LAD 07/10/2013  . Long term current use of anticoagulant therapy 03/23/2013  . History of rectal cancer   . Ischemic cardiomyopathy-EF 35% 04/24/15 echo     Past Surgical History:  Procedure Laterality Date  . A/V FISTULAGRAM Right 05/04/2018   Procedure: A/V FISTULAGRAM;  Surgeon: Conrad [place] Lind[place]st, MD;  Location: [place] [facility];  Service: Cardiovascular;  Laterality: Right;  . Abdominal and Perineal Resection of Rectum with Total Mesorectal Excision  10/04/2007  . AV FISTULA PLACEMENT Right 09/16/2015   Procedure: ARTERIOVENOUS (AV) FISTULA CREATION - BRACHIOCEPHALIC;  Surgeon: Elam Dutch, MD;  Location: [place];  Service: Vascular;  Laterality: Right;  . BI-VENTRICULAR IMPLANTABLE CARDIOVERTER DEFIBRILLATOR  (CRT-D)  2009  . CARDIAC CATHETERIZATION  08/2001; 2009   ; [name] 07/10/2013  . CARDIAC DEFIBRILLATOR PLACEMENT  2002  .  CARDIAC DEFIBRILLATOR PLACEMENT  2009   Upgraded to a BiV ICD  . COLONOSCOPY  09/14/2011   Dr. Gala Romney: via colostomy, Single pedunculated benign inflammatory polyp. Due for surveillance Oct 2015  . COLONOSCOPY N/A 07/02/2014   Procedure: COLONOSCOPY;  Surgeon: Daneil Dolin, MD;  [facility];  Service: Endoscopy;  Laterality: N/A;  7:30 / COLONOSCOPY THRU COLOSTOMY  . COLONOSCOPY N/A 12/11/2014   Dr. Gala Romney via colostomy. Normal. Repeat in 2021.   Marland Kitchen COLONOSCOPY N/A 08/24/2015   Dr. Havery Moros: diminutive polyp not removed, stoma site of bleeding  . COLOSTOMY  2008  . CORONARY ANGIOPLASTY WITH STENT PLACEMENT  2001; ~ 2006   "1 + 1"   . ELECTROPHYSIOLOGIC STUDY N/A 08/28/2015   Procedure: AV Node Ablation;  Surgeon: Will  Meredith Leeds, MD;  Location: [place] [facility] LAB;  Service: Cardiovascular;  Laterality: N/A;  . EP IMPLANTABLE DEVICE N/A 04/10/2015   Procedure: Ppm/Biv Ppm Generator Changeout;  Surgeon: Evans Lance, MD;  Location: [place] INVASIVE CV LAB CUPID;  Service: Cardiovascular;  Laterality: N/A;  . ERCP N/A 06/11/2016   Procedure: ENDOSCOPIC RETROGRADE CHOLANGIOPANCREATOGRAPHY (ERCP);  Surgeon: Teena Irani, MD;  Location: [facility] ENDOSCOPY;  Service: Endoscopy;  Laterality: N/A;  . ERCP N/A 01/26/2017   Procedure: ENDOSCOPIC RETROGRADE CHOLANGIOPANCREATOGRAPHY (ERCP);  Surgeon: Teena Irani, MD;  Location: [facility] ENDOSCOPY;  Service: Endoscopy;  Laterality: N/A;  . ERCP  06/2016   Dr. Amedeo Plenty: duodenal fistula, dilated bile duct but no obvious stones, s/p sphincterotomy and stent placement  . ERCP  01/2017   Dr. Amedeo Plenty: CBD stones, s/p sphincterotomy, balloon extraction, stent removal  . ESOPHAGOGASTRODUODENOSCOPY N/A 07/02/2014   Procedure: ESOPHAGOGASTRODUODENOSCOPY (EGD);  Surgeon: Daneil Dolin, MD;  [facility];  Service: Endoscopy;  Laterality: N/A;  7:30  . ESOPHAGOGASTRODUODENOSCOPY N/A 12/11/2014   [id number] EGD  . ESOPHAGOGASTRODUODENOSCOPY (EGD) WITH PROPOFOL N/A  04/18/2017   Procedure: ESOPHAGOGASTRODUODENOSCOPY (EGD) WITH PROPOFOL;  Surgeon: Daneil Dolin, MD;  [facility];  Service: Endoscopy;  Laterality: N/A;  2:30pm  . GASTROINTESTINAL STENT REMOVAL N/A 01/26/2017   Procedure: GASTROINTESTINAL STENT REMOVAL;  Surgeon: Teena Irani, MD;  Location: [place];  Service: Endoscopy;  Laterality: N/A;  . GIVENS CAPSULE STUDY N/A 07/23/2015   Dr. Michail Sermon: small non-bleeding AVM, mild gastritis  . INSERTION OF DIALYSIS CATHETER Right 05/04/2018   Procedure: INSERTION OF DIALYSIS CATHETER;  Surgeon: Angelia Mould, MD;  Location: [place];  Service: Vascular;  Laterality: Right;  . IR FLUORO GUIDE CV LINE RIGHT  07/27/2017  . IR REMOVAL TUN CV CATH W/O FL  08/12/2017  . IR THORACENTESIS ASP PLEURAL SPACE W/IMG GUIDE  11/07/2017  . IR THORACENTESIS ASP PLEURAL SPACE W/IMG GUIDE  05/30/2018  . IR US GUIDE VASC ACCESS RIGHT  07/27/2017  . LEFT HEART CATHETERIZATION WITH CORONARY ANGIOGRAM N/A 07/13/2013   Procedure: LEFT HEART CATHETERIZATION WITH CORONARY ANGIOGRAM;  Surgeon: Lorretta Harp, MD;  Location: [facility] CATH LAB;  Service: Cardiovascular;  Laterality: N/A;  . Venia Minks DILATION N/A 07/02/2014   Procedure: Venia Minks DILATION;  Surgeon: Daneil Dolin, MD;  [facility];  Service: Endoscopy;  Laterality: N/A;  7:30  . MALONEY DILATION N/A 04/18/2017   Procedure: Venia Minks DILATION;  Surgeon: Daneil Dolin, MD;  [facility];  Service: Endoscopy;  Laterality: N/A;  . PORTACATH PLACEMENT  06/2007   "removed ~ 1 yr later"  . RIGHT HEART CATHETERIZATION N/A 02/24/2015   Procedure: RIGHT HEART CATH;  Surgeon: Jolaine Artist, MD;  Location: [facility] CATH LAB;  Service: Cardiovascular;  Laterality: N/A;  . SAVORY DILATION N/A 07/02/2014   Procedure: SAVORY DILATION;  Surgeon: Daneil Dolin, MD;  [facility];  Service: Endoscopy;  Laterality: N/A;  7:30        Home Medications    Prior to Admission medications     Medication Sig Start Date End Date Taking? Authorizing Provider  ALPRAZolam Duanne Moron) 0.5 MG tablet Take 0.5 mg by mouth 3 (three) times daily as needed. 05/25/18  Yes [provider]  aspirin EC 81 MG tablet Take 1 tablet (81 mg total) by mouth daily. 05/04/18 05/04/19 Yes Conrad [place], MD  atorvastatin (LIPITOR) 10 MG tablet Take 1 tablet (10 mg total) by mouth daily. 12/28/17  06/27/18 Yes Bensimhon, Shaune Pascal, MD  cephALEXin (KEFLEX) 250 MG capsule Take 250 capsules by mouth 2 (two) times daily. Staph infection 10/25/17  Yes [provider]  fexofenadine (ALLEGRA) 180 MG tablet Take 180 mg by mouth daily.   Yes [provider]  fluticasone (FLONASE) 50 MCG/ACT nasal spray Place 2 sprays into both nostrils daily. 06/22/18  Yes [provider]  levothyroxine (SYNTHROID, LEVOTHROID) 75 MCG tablet Take 75 mcg by mouth daily before breakfast.   Yes [provider]  midodrine (PROAMATINE) 5 MG tablet TAKE 3 TABLETS (15 MG TOTAL) BY MOUTH 2 (TWO) TIMES DAILY WITH A MEAL. 04/12/18  Yes Bensimhon, Shaune Pascal, MD  nitroGLYCERIN (NITROSTAT) 0.4 MG SL tablet Place 1 tablet (0.4 mg total) under the tongue every 5 (five) minutes as needed for chest pain. 08/19/17 03/31/19 Yes Angiulli, Lavon Paganini, PA-C  NOVOLOG FLEXPEN 100 UNIT/ML FlexPen Inject 3 Units into the skin 3 (three) times daily as needed for high blood sugar.  08/20/17  Yes [provider]  Oxycodone HCl 10 MG TABS Take 10 mg by mouth every 4 (four) hours as needed. For pain 05/25/18  Yes [provider]  pantoprazole (PROTONIX) 40 MG tablet Take 1 tablet (40 mg total) by mouth daily. 08/19/17  Yes Angiulli, Lavon Paganini, PA-C  PROAIR RESPICLICK 637 (90 BASE) MCG/ACT AEPB Inhale 1 puff into the lungs every 6 (six) hours as needed (for breathing).  08/15/15  Yes [provider]  sevelamer carbonate (RENVELA) 800 MG tablet Take 800-1,600 mg by mouth See admin instructions. 2 tablets with meals and 1 tablet  with snacks 06/25/18  Yes [provider]  sucroferric oxyhydroxide (VELPHORO) 500 MG chewable tablet Chew 1 tablet (500 mg total) by mouth 3 (three) times daily with meals. 08/19/17  Yes Angiulli, Lavon Paganini, PA-C  warfarin (COUMADIN) 1 MG tablet TAKE 1 TABLET BY MOUTH EVERY DAY 04/24/18  Yes Bensimhon, Shaune Pascal, MD  glucose blood test strip Use to test blood sugar 3 times daily 08/19/17   Angiulli, Lavon Paganini, PA-C    Family History Family History  Problem Relation Age of Onset  . Colon cancer Mother 35  . Coronary artery disease Father   . Colon cancer Sister 71  . Diabetes Sister   . Colon cancer Other        2 cousins, succumbed to illness    Social History Social History   Tobacco Use  . Smoking status: Never Smoker  . Smokeless tobacco: Never Used  Substance Use Topics  . Alcohol use: No    Alcohol/week: 0.0 oz    Comment: Former user 45 years ago  . Drug use: No     Allergies   Patient has no known allergies.   Review of Systems Review of Systems  Respiratory: Positive for shortness of breath.   All other systems reviewed and are negative.    Physical Exam Updated Vital Signs BP (!) 88/67   Pulse 74   Temp 97.7 F (36.5 C) (Oral)   Resp 14   Ht 5\' 10"  (1.778 m)   Wt 81.6 kg (180 lb)   SpO2 100%   BMI 25.83 kg/m   Physical Exam  Constitutional: He is oriented to person, place, and time. He appears well-developed and well-nourished.  HENT:  Head: Normocephalic and atraumatic.  Mouth/Throat: Oropharynx is clear and moist.  Eyes: Pupils are equal, round, and reactive to light. EOM are normal.  Neck: Normal range of motion. Neck supple.  Cardiovascular: Normal rate, regular rhythm, normal heart sounds and intact distal pulses.  Pulmonary/Chest: Effort normal and breath sounds normal.  Abdominal: Soft. Bowel sounds are normal. He exhibits ascites.  Musculoskeletal:       Right lower leg: He exhibits edema.       Left lower leg: He exhibits edema.    Neurological: He is alert and oriented to person, place, and time.  Skin: Skin is warm. Capillary refill takes less than 2 seconds.  Psychiatric: He has a normal mood and affect. His behavior is normal.  Nursing note and vitals reviewed.    ED Treatments / Results  Labs (all labs ordered are listed, but only abnormal results are displayed) Labs Reviewed  BRAIN NATRIURETIC PEPTIDE - Abnormal; Notable for the following components:      Result Value   B Natriuretic Peptide 2,606.0 (*)    All other components within normal limits  COMPREHENSIVE METABOLIC PANEL - Abnormal; Notable for the following components:   Sodium 134 (*)    Glucose, Bld 159 (*)    BUN 41 (*)    Creatinine, Ser 2.83 (*)    Calcium 8.7 (*)    Total Protein 6.4 (*)    Albumin 2.7 (*)    Alkaline Phosphatase 179 (*)    Total Bilirubin 2.0 (*)    GFR calc non Af Amer 22 (*)    GFR calc Af Amer 26 (*)    All other components within normal limits  PROTIME-INR - Abnormal; Notable for the following components:   Prothrombin Time 29.7 (*)    All other components within normal limits  CBC WITH DIFFERENTIAL/PLATELET - Abnormal; Notable for the following components:   RBC 3.12 (*)    Hemoglobin 11.3 (*)    HCT 34.8 (*)    MCV 111.5 (*)    MCH 36.2 (*)    Platelets 81 (*)    Lymphs Abs 0.3 (*)    All other components within normal limits  AMMONIA  I-STAT TROPONIN, ED    EKG EKG Interpretation  Date/Time:  Tuesday June 27 2018 18:41:25 EDT Ventricular Rate:  88 PR Interval:    QRS Duration: 131 QT Interval:    QTC Calculation:   R Axis:   146 Text Interpretation:  VENTRICULAR PACED RHYTHM No further analysis attempted due to paced rhythm Baseline wander in lead(s) I II aVR Confirmed by Isla Pence [phone]) on 06/27/2018 7:49:46 PM   Radiology Dg Chest Port 1 View  Result Date: 06/27/2018 CLINICAL DATA:  Shortness of breath. EXAM: PORTABLE CHEST 1 VIEW COMPARISON:  Radiograph of May 30, 2018.  FINDINGS: Stable cardiomegaly. Left-sided pacemaker is unchanged in position. Right internal jugular dialysis catheter is unchanged in position. Left lung is clear. Large right pleural effusion is noted which is increased compared to prior exam, with associated atelectasis or infiltrate. No pneumothorax is seen. Bony thorax is unremarkable. IMPRESSION: Large right pleural effusion is noted which is increased in size compared to prior exam, with probable underlying atelectasis or infiltrate. Electronically Signed   By: Marijo Conception, M.D.   On: 06/27/2018 19:18    Procedures Procedures (including critical care time)  Medications Ordered in ED Medications  morphine 2 MG/ML injection 2 mg (2 mg Intravenous Refused 06/27/18 2124)     Initial Impression / Assessment and Plan / ED Course  I have reviewed the triage vital signs and the nursing notes.  Pertinent labs & imaging results that were available during my care of the  patient were reviewed by me and considered in my medical decision making (see chart for details).    CRITICAL CARE Performed by: Isla Pence   Total critical care time: 45 minutes  Critical care time was exclusive of separately billable procedures and treating other patients.  Critical care was necessary to treat or prevent imminent or life-threatening deterioration.  Critical care was time spent personally by me on the following activities: development of treatment plan with patient and/or surrogate as well as nursing, discussions with consultants, evaluation of patient's response to treatment, examination of patient, obtaining history from patient or surrogate, ordering and performing treatments and interventions, ordering and review of laboratory studies, ordering and review of radiographic studies, pulse oximetry and re-evaluation of patient's condition.  Pt with large right pleural effusion that will need to be drained.  He is anticoagulated on coumadin, so it may  not be able to be done tomorrow.  He is a dialysis pt and is due for dialysis tomorrow, so I did not want to give him a lot of fluids for the blood pressure.  Pt placed on bipap to help with breathing and it has.  He looks and feels more comfortable.  The pt d/w Dr. Manuella Ghazi (triad) for admission.  Final Clinical Impressions(s) / ED Diagnoses   Final diagnoses:  Pleural effusion, right  Anticoagulated on Coumadin  ESRD on hemodialysis [facility])    ED Discharge Orders    None       Isla Pence, MD 06/27/18 2217

## 2018-06-27 NOTE — Patient Instructions (Signed)
Medication Instructions:  Your physician recommends that you continue on your current medications as directed. Please refer to the Current Medication list given to you today.   Labwork: NONE   Testing/Procedures: NONE   Follow-Up: Your physician wants you to follow-up in: 1 Year with Dr. Taylor. You will receive a reminder letter in the mail two months in advance. If you don't receive a letter, please call our office to schedule the follow-up appointment.   Any Other Special Instructions Will Be Listed Below (If Applicable).     If you need a refill on your cardiac medications before your next appointment, please call your pharmacy.  Thank you for choosing [facility]!   

## 2018-06-27 NOTE — H&P (Addendum)
History and Physical    [name] JXB:[id number] DOB: [birth year] DOA: 06/27/2018  PCP: Elfredia Nevins, MD   Patient coming from: Home  Chief Complaint: Dyspnea  HPI: [name] is a 63 y.o. male with medical history significant for ischemic cardiomyopathy with EF 20 to 25% last seen on echo 03/2018 status post ICD placement, end-stage renal disease on hemodialysis Monday, Wednesday, Friday, chronic systolic heart failure, atrial fibrillation status post AV node ablation, type 2 diabetes, chronic hypotension, hypothyroidism,dyslipidemia, and recurrent thoracentesis secondary to pleural effusions who presented to the emergency department with some shortness of breath as well as right-sided substernal chest pain that is only noticed with deep inspiration.  He actually did see his cardiologist Dr. Ladona Ridgel earlier today at which point everything was noted to be okay and he was instructed to follow-up again in 1 year.  EMS had noted systolic blood pressure of 80s while in route and he was given 200 cc of normal saline.  Patient recently had a thoracentesis on 6/25 with 2 prior last year in September and December. Patient denies any fever, chills, or chest pain currently.  No lower extremity edema noted.  He denies any palpitations, lightheadedness, or dizziness.   ED Course: Vital signs with paced rhythm noted and otherwise soft blood pressure readings.  Wife at bedside states that patient chronically has low blood pressure readings which are in the 80s systolic.  He has been placed on BiPAP due to some mild respiratory distress and is much more comfortable.  Chest x-ray demonstrates a large right-sided pleural effusion and laboratory data demonstrates what appears to be chronic anemia and thrombocytopenia as well as elevated creatinine consistent with his end-stage renal disease.  INR is currently 2.85.  EKG with paced rhythm.  BNP is noted to be 2606.  Review of Systems: All others reviewed and  otherwise negative.  Past Medical History:  Diagnosis Date  . Adenocarcinoma of rectum (HCC)    a. 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 AICD (automatic cardioverter/defibrillator) present 2002   BI V ICD  . Anemia   . CAD (coronary artery disease)    a. BMS to LAD 2001 at [facility] b. PTCA/atherectomy ramus and BMS to LAD 2009  . CHF (congestive heart failure) (HCC)   . Cholelithiasis 06/2015  . Chronic kidney disease, stage IV (severe) (HCC)   . Chronic systolic heart failure (HCC)   . Cirrhosis (HCC)   . Colostomy in place [facility])   . Dizziness    a. chronic. Admission for this 07/18/2014  . DM type 2, uncontrolled, with renal complications (HCC)   . Dysrhythmia   . Essential hypertension, benign   . GERD (gastroesophageal reflux disease)   . HCAP (healthcare-associated pneumonia) 07/21/2015  . Hematuria   . History of blood transfusion    "I've had 2 units so far this year" (09/27/2015)  . HLD (hyperlipidemia)   . Ischemic cardiomyopathy    EF 18% by nuclear study 2016, multiple myocardial infarctions in past    . Myocardial infarction (HCC) 2001  . Obesity   . Orthostatic hypotension   . OSA on CPAP   . Paroxysmal atrial fibrillation (HCC)    a. on amiodarone, digoxin and Eliquis  . PONV (postoperative nausea and vomiting)   . Presence of permanent cardiac pacemaker   . Prostate cancer (HCC)    a. s/p seed implants with chemo and radiation  . SAH (subarachnoid hemorrhage) (HCC)    post-traumatic (fall) [facility] 12/2014  . TIA (transient ischemic  attack)     Past Surgical History:  Procedure Laterality Date  . A/V FISTULAGRAM Right 05/04/2018   Procedure: A/V FISTULAGRAM;  Surgeon: Fransisco Hertz, MD;  Location: [facility] INVASIVE CV LAB;  Service: Cardiovascular;  Laterality: Right;  . Abdominal and Perineal Resection of Rectum with Total Mesorectal Excision  10/04/2007  . AV FISTULA PLACEMENT Right 09/16/2015   Procedure: ARTERIOVENOUS (AV) FISTULA CREATION - BRACHIOCEPHALIC;  Surgeon: Sherren Kerns,  MD;  Location: [facility] OR;  Service: Vascular;  Laterality: Right;  . BI-VENTRICULAR IMPLANTABLE CARDIOVERTER DEFIBRILLATOR  (CRT-D)  2009  . CARDIAC CATHETERIZATION  08/2001; 2009   ; Hattie Perch 07/10/2013  . CARDIAC DEFIBRILLATOR PLACEMENT  2002  . CARDIAC DEFIBRILLATOR PLACEMENT  2009   Upgraded to a BiV ICD  . COLONOSCOPY  09/14/2011   Dr. Jena Gauss: via colostomy, Single pedunculated benign inflammatory polyp. Due for surveillance Oct 2015  . COLONOSCOPY N/A 07/02/2014   Procedure: COLONOSCOPY;  Surgeon: Corbin Ade, MD;  Location: [facility] ENDO [facility];  Service: Endoscopy;  Laterality: N/A;  7:30 / COLONOSCOPY THRU COLOSTOMY  . COLONOSCOPY N/A 12/11/2014   Dr. Jena Gauss via colostomy. Normal. Repeat in 2021.   Marland Kitchen COLONOSCOPY N/A 08/24/2015   Dr. Adela Lank: diminutive polyp not removed, stoma site of bleeding  . COLOSTOMY  2008  . CORONARY ANGIOPLASTY WITH STENT PLACEMENT  2001; ~ 2006   "1 + 1"   . ELECTROPHYSIOLOGIC STUDY N/A 08/28/2015   Procedure: AV Node Ablation;  Surgeon: Will Jorja Loa, MD;  Location: [facility] INVASIVE CV LAB;  Service: Cardiovascular;  Laterality: N/A;  . EP IMPLANTABLE DEVICE N/A 04/10/2015   Procedure: Ppm/Biv Ppm Generator Changeout;  Surgeon: Marinus Maw, MD;  Location: [facility] INVASIVE CV LAB CUPID;  Service: Cardiovascular;  Laterality: N/A;  . ERCP N/A 06/11/2016   Procedure: ENDOSCOPIC RETROGRADE CHOLANGIOPANCREATOGRAPHY (ERCP);  Surgeon: Dorena Cookey, MD;  Location: [facility] ENDOSCOPY;  Service: Endoscopy;  Laterality: N/A;  . ERCP N/A 01/26/2017   Procedure: ENDOSCOPIC RETROGRADE CHOLANGIOPANCREATOGRAPHY (ERCP);  Surgeon: Dorena Cookey, MD;  Location: [facility] ENDOSCOPY;  Service: Endoscopy;  Laterality: N/A;  . ERCP  06/2016   Dr. Madilyn Fireman: duodenal fistula, dilated bile duct but no obvious stones, s/p sphincterotomy and stent placement  . ERCP  01/2017   Dr. Madilyn Fireman: CBD stones, s/p sphincterotomy, balloon extraction, stent removal  . ESOPHAGOGASTRODUODENOSCOPY N/A 07/02/2014   Procedure:  ESOPHAGOGASTRODUODENOSCOPY (EGD);  Surgeon: Corbin Ade, MD;  Location: [facility];  Service: Endoscopy;  Laterality: N/A;  7:30  . ESOPHAGOGASTRODUODENOSCOPY N/A 12/11/2014   WGN:FAOZHY EGD  . ESOPHAGOGASTRODUODENOSCOPY (EGD) WITH PROPOFOL N/A 04/18/2017   Procedure: ESOPHAGOGASTRODUODENOSCOPY (EGD) WITH PROPOFOL;  Surgeon: Corbin Ade, MD;  Location: [facility];  Service: Endoscopy;  Laterality: N/A;  2:30pm  . GASTROINTESTINAL STENT REMOVAL N/A 01/26/2017   Procedure: GASTROINTESTINAL STENT REMOVAL;  Surgeon: Dorena Cookey, MD;  Location: [facility] ENDOSCOPY;  Service: Endoscopy;  Laterality: N/A;  . GIVENS CAPSULE STUDY N/A 07/23/2015   Dr. Bosie Clos: small non-bleeding AVM, mild gastritis  . INSERTION OF DIALYSIS CATHETER Right 05/04/2018   Procedure: INSERTION OF DIALYSIS CATHETER;  Surgeon: Chuck Hint, MD;  Location: [facility] OR;  Service: Vascular;  Laterality: Right;  . IR FLUORO GUIDE CV LINE RIGHT  07/27/2017  . IR REMOVAL TUN CV CATH W/O FL  08/12/2017  . IR THORACENTESIS ASP PLEURAL SPACE W/IMG GUIDE  11/07/2017  . IR THORACENTESIS ASP PLEURAL SPACE W/IMG GUIDE  05/30/2018  . IR US GUIDE VASC ACCESS RIGHT  07/27/2017  . LEFT  HEART CATHETERIZATION WITH CORONARY ANGIOGRAM N/A 07/13/2013   Procedure: LEFT HEART CATHETERIZATION WITH CORONARY ANGIOGRAM;  Surgeon: [name], MD;  Location: [facility] CATH LAB;  Service: Cardiovascular;  Laterality: N/A;  . Elease Hashimoto DILATION N/A 07/02/2014   Procedure: Elease Hashimoto DILATION;  Surgeon: Corbin Ade, MD;  Location: [facility];  Service: Endoscopy;  Laterality: N/A;  7:30  . MALONEY DILATION N/A 04/18/2017   Procedure: Elease Hashimoto DILATION;  Surgeon: Corbin Ade, MD;  Location: [facility];  Service: Endoscopy;  Laterality: N/A;  . PORTACATH PLACEMENT  06/2007   "removed ~ 1 yr later"  . RIGHT HEART CATHETERIZATION N/A 02/24/2015   Procedure: RIGHT HEART CATH;  Surgeon: Dolores Patty, MD;  Location: [facility] CATH LAB;  Service: Cardiovascular;   Laterality: N/A;  . SAVORY DILATION N/A 07/02/2014   Procedure: SAVORY DILATION;  Surgeon: Corbin Ade, MD;  Location: [facility] ENDO [facility];  Service: Endoscopy;  Laterality: N/A;  7:30     reports that he has never smoked. He has never used smokeless tobacco. He reports that he does not drink alcohol or use drugs.  No Known Allergies  Family History  Problem Relation Age of Onset  . Colon cancer Mother 84  . Coronary artery disease Father   . Colon cancer Sister 28  . Diabetes Sister   . Colon cancer Other        2 cousins, succumbed to illness    Prior to Admission medications   Medication Sig Start Date End Date Taking? Authorizing Provider  ALPRAZolam Prudy Feeler) 0.5 MG tablet Take 0.5 mg by mouth 3 (three) times daily as needed. 05/25/18  Yes [provider]  aspirin EC 81 MG tablet Take 1 tablet (81 mg total) by mouth daily. 05/04/18 05/04/19 Yes Fransisco Hertz, MD  atorvastatin (LIPITOR) 10 MG tablet Take 1 tablet (10 mg total) by mouth daily. 12/28/17 06/27/18 Yes Bensimhon, Bevelyn Buckles, MD  cephALEXin (KEFLEX) 250 MG capsule Take 250 capsules by mouth 2 (two) times daily. Staph infection 10/25/17  Yes [provider]  fexofenadine (ALLEGRA) 180 MG tablet Take 180 mg by mouth daily.   Yes [provider]  fluticasone (FLONASE) 50 MCG/ACT nasal spray Place 2 sprays into both nostrils daily. 06/22/18  Yes [provider]  levothyroxine (SYNTHROID, LEVOTHROID) 75 MCG tablet Take 75 mcg by mouth daily before breakfast.   Yes [provider]  midodrine (PROAMATINE) 5 MG tablet TAKE 3 TABLETS (15 MG TOTAL) BY MOUTH 2 (TWO) TIMES DAILY WITH A MEAL. 04/12/18  Yes Bensimhon, Bevelyn Buckles, MD  nitroGLYCERIN (NITROSTAT) 0.4 MG SL tablet Place 1 tablet (0.4 mg total) under the tongue every 5 (five) minutes as needed for chest pain. 08/19/17 03/31/19 Yes Angiulli, Mcarthur Rossetti, PA-C  NOVOLOG FLEXPEN 100 UNIT/ML FlexPen Inject 3 Units into the skin 3 (three) times daily as  needed for high blood sugar.  08/20/17  Yes [provider]  Oxycodone HCl 10 MG TABS Take 10 mg by mouth every 4 (four) hours as needed. For pain 05/25/18  Yes [provider]  pantoprazole (PROTONIX) 40 MG tablet Take 1 tablet (40 mg total) by mouth daily. 08/19/17  Yes Angiulli, Mcarthur Rossetti, PA-C  PROAIR RESPICLICK 108 (90 BASE) MCG/ACT AEPB Inhale 1 puff into the lungs every 6 (six) hours as needed (for breathing).  08/15/15  Yes [provider]  sevelamer carbonate (RENVELA) 800 MG tablet Take 800-1,600 mg by mouth See admin instructions. 2 tablets with meals and 1  tablet with snacks 06/25/18  Yes [provider]  sucroferric oxyhydroxide (VELPHORO) 500 MG chewable tablet Chew 1 tablet (500 mg total) by mouth 3 (three) times daily with meals. 08/19/17  Yes Angiulli, Mcarthur Rossetti, PA-C  warfarin (COUMADIN) 1 MG tablet TAKE 1 TABLET BY MOUTH EVERY DAY 04/24/18  Yes Bensimhon, Bevelyn Buckles, MD  glucose blood test strip Use to test blood sugar 3 times daily 08/19/17   Charlton Amor, PA-C    Physical Exam: Vitals:   06/27/18 2047 06/27/18 2100 06/27/18 2130 06/27/18 2200  BP: 102/66 96/64 (!) 88/67 (!) 93/57  Pulse: 73 70 74 70  Resp: 18 20 14 11   Temp:      TempSrc:      SpO2: 100% 100% 100% 100%  Weight:      Height:        Constitutional: NAD, calm, comfortable Vitals:   06/27/18 2047 06/27/18 2100 06/27/18 2130 06/27/18 2200  BP: 102/66 96/64 (!) 88/67 (!) 93/57  Pulse: 73 70 74 70  Resp: 18 20 14 11   Temp:      TempSrc:      SpO2: 100% 100% 100% 100%  Weight:      Height:       Eyes: lids and conjunctivae normal ENMT: Mucous membranes are moist.  Neck: normal, supple Respiratory: Diminished breath sounds to the right chest. Normal respiratory effort. No accessory muscle use.  Currently on BiPAP with FiO2 40% Cardiovascular: Regular rate and rhythm, no murmurs.  Trace pitting edema bilaterally. Abdomen: Mildly ascites. Bowel sounds positive.    Musculoskeletal:  No joint deformity upper and lower extremities.   Skin: no rashes, lesions, ulcers.   Labs on Admission: I have personally reviewed following labs and imaging studies  CBC: Recent Labs  Lab 06/27/18 1939  WBC 7.0  NEUTROABS 6.2  HGB 11.3*  HCT 34.8*  MCV 111.5*  PLT 81*   Basic Metabolic Panel: Recent Labs  Lab 06/27/18 1939  NA 134*  K 4.0  CL 98  CO2 27  GLUCOSE 159*  BUN 41*  CREATININE 2.83*  CALCIUM 8.7*   GFR: Estimated Creatinine Clearance: 27.6 mL/min (A) (by C-G formula based on SCr of 2.83 mg/dL (H)). Liver Function Tests: Recent Labs  Lab 06/27/18 1939  AST 25  ALT 15  ALKPHOS 179*  BILITOT 2.0*  PROT 6.4*  ALBUMIN 2.7*   No results for input(s): LIPASE, AMYLASE in the last 168 hours. Recent Labs  Lab 06/27/18 1939  AMMONIA 23   Coagulation Profile: Recent Labs  Lab 06/27/18 06/27/18 1939  INR 3.3* 2.85   Cardiac Enzymes: No results for input(s): CKTOTAL, CKMB, CKMBINDEX, TROPONINI in the last 168 hours. BNP (last 3 results) No results for input(s): PROBNP in the last 8760 hours. HbA1C: No results for input(s): HGBA1C in the last 72 hours. CBG: No results for input(s): GLUCAP in the last 168 hours. Lipid Profile: No results for input(s): CHOL, HDL, LDLCALC, TRIG, CHOLHDL, LDLDIRECT in the last 72 hours. Thyroid Function Tests: No results for input(s): TSH, T4TOTAL, FREET4, T3FREE, THYROIDAB in the last 72 hours. Anemia Panel: No results for input(s): VITAMINB12, FOLATE, FERRITIN, TIBC, IRON, RETICCTPCT in the last 72 hours. Urine analysis:    Component Value Date/Time   COLORURINE YELLOW 07/09/2017 2215   APPEARANCEUR HAZY (A) 07/09/2017 2215   LABSPEC 1.015 07/09/2017 2215   PHURINE 5.0 07/09/2017 2215   GLUCOSEU 100 (A) 07/09/2017 2215   HGBUR TRACE (A) 07/09/2017 2215   BILIRUBINUR NEGATIVE  07/09/2017 2215   KETONESUR NEGATIVE 07/09/2017 2215   PROTEINUR NEGATIVE 07/09/2017 2215   UROBILINOGEN 1.0  08/23/2015 0322   NITRITE NEGATIVE 07/09/2017 2215   LEUKOCYTESUR TRACE (A) 07/09/2017 2215    Radiological Exams on Admission: Dg Chest Port 1 View  Result Date: 06/27/2018 CLINICAL DATA:  Shortness of breath. EXAM: PORTABLE CHEST 1 VIEW COMPARISON:  Radiograph of May 30, 2018. FINDINGS: Stable cardiomegaly. Left-sided pacemaker is unchanged in position. Right internal jugular dialysis catheter is unchanged in position. Left lung is clear. Large right pleural effusion is noted which is increased compared to prior exam, with associated atelectasis or infiltrate. No pneumothorax is seen. Bony thorax is unremarkable. IMPRESSION: Large right pleural effusion is noted which is increased in size compared to prior exam, with probable underlying atelectasis or infiltrate. Electronically Signed   By: Lupita Raider, M.D.   On: 06/27/2018 19:18    EKG: Independently reviewed. 88bpm V-paced.  Assessment/Plan Principal Problem:   Recurrent pleural effusion on right Active Problems:   Ischemic cardiomyopathy-EF 35% 04/24/15 echo   Long term current use of anticoagulant therapy   DM type 2, uncontrolled, with renal complications (HCC)   BiV ICD (St Jude) gen change 04/10/15   Anemia of chronic disease   Thrombocytopenia (HCC)   Chronic systolic heart failure (HCC)   ESRD on dialysis (HCC)    1. Recurrent right-sided pleural effusion.  Patient does have complications of thrombocytopenia as well as therapeutic INR.  We will give vitamin K 1 mg oral dose now with repeat INR in a.m. and order ultrasound-guided thoracentesis per radiology which may have to wait until the following day.  Maintain on BiPAP in stepdown unit with careful monitoring.  N.p.o. except medications for now.  No bronchospasms noted. 2. Acute on chronic systolic heart failure decompensation.  Patient does not appear to be on any diuretics at home and will hold currently given his hypotension.  Consult to nephrology for hemodialysis in  a.m. 3. ESRD on HD M, W, F.  Consult to nephrology for hemodialysis. 4. Type 2 diabetes.  Sliding scale insulin. 5. Atrial fibrillation status post AV node ablation with ICD.  Hold warfarin for now and recheck INR in a.m. after vitamin K oral dose.  Currently rate controlled.  Monitor on telemetry. 6. Chronic hypotension.  Monitor carefully in ICU and maintain map greater than 60.  Maintain on midodrine and consider pressors as needed. 7. Dyslipidemia.  Maintain on atorvastatin. 8. Hypothyroidism.  Maintain on levothyroxine. 9. GERD.  Maintain on Protonix. 10. Chronic thrombocytopenia.  Stable-monitor with repeat CBC.   DVT prophylaxis: SCDs; holding Warfarin Code Status: DNR Family Communication: Wife at bedside Disposition Plan:Thoracentesis Consults called:Nephrology for HD Admission status: Inpatient, SDU   Jamari Moten Hoover Brunette DO [facility] Pager [phone]  If 7PM-7AM, please contact night-coverage [email or web address] Password [facility]  06/27/2018, 11:14 PM

## 2018-06-27 NOTE — ED Notes (Signed)
Pt refused his morphine at this time; states he will call if his pain does not decrease; pt given call bell and cut lights down

## 2018-06-27 NOTE — Progress Notes (Signed)
HPI [name] returns today for ongoing ICD evaluation and management. He is a pleasant 62 yo man, s/p MI, s/p ICD implant in 2002 and a BiV ICD upgrade in 2009. He underwent AV node ablation after his ventricular rate could not be controlled. In the interim, he has had gradual worsening in his renal function and is now on HD. His right arm fistula clotted off and he has an indwelling HD catheter. He is on chronic suppressive anti-biotics after developing a staph infection. Fortunately TEE did not show evidence of SBE and he does not have symptoms of any active infection.  No Known Allergies   Current Outpatient Medications  Medication Sig Dispense Refill  . aspirin EC 81 MG tablet Take 1 tablet (81 mg total) by mouth daily. 150 tablet 2  . atorvastatin (LIPITOR) 10 MG tablet Take 1 tablet (10 mg total) by mouth daily. 90 tablet 3  . cephALEXin (KEFLEX) 250 MG capsule Take 250 capsules by mouth 2 (two) times daily. Staph infection    . fexofenadine (ALLEGRA) 180 MG tablet Take 180 mg by mouth daily.    Marland Kitchen glucose blood test strip Use to test blood sugar 3 times daily 100 each 4  . levothyroxine (SYNTHROID, LEVOTHROID) 75 MCG tablet Take 75 mcg by mouth daily before breakfast.    . midodrine (PROAMATINE) 5 MG tablet TAKE 3 TABLETS (15 MG TOTAL) BY MOUTH 2 (TWO) TIMES DAILY WITH A MEAL. 180 tablet 3  . nitroGLYCERIN (NITROSTAT) 0.4 MG SL tablet Place 1 tablet (0.4 mg total) under the tongue every 5 (five) minutes as needed for chest pain. 25 tablet 3  . NOVOLOG FLEXPEN 100 UNIT/ML FlexPen Inject 3 Units into the skin 3 (three) times daily.  11  . pantoprazole (PROTONIX) 40 MG tablet Take 1 tablet (40 mg total) by mouth daily. 30 tablet 0  . PROAIR RESPICLICK 409 (90 BASE) MCG/ACT AEPB Inhale 1 puff into the lungs every 6 (six) hours as needed (for breathing).   0  . sucroferric oxyhydroxide (VELPHORO) 500 MG chewable tablet Chew 1 tablet (500 mg total) by mouth 3 (three) times daily with  meals. 90 tablet 0  . warfarin (COUMADIN) 1 MG tablet TAKE 1 TABLET BY MOUTH EVERY DAY 30 tablet 3   No current facility-administered medications for this visit.      Past Medical History:  Diagnosis Date  . Adenocarcinoma of rectum ([place])    a. 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 AICD (automatic cardioverter/defibrillator) present 2002   BI V ICD  . Anemia   . CAD (coronary artery disease)    a. BMS to LAD 2001 at [facility] b. PTCA/atherectomy ramus and BMS to LAD 2009  . CHF (congestive heart failure) ([place])   . Cholelithiasis 06/2015  . Chronic kidney disease, stage IV (severe) ([place])   . Chronic systolic heart failure ([place])   . Cirrhosis ([place])   . Colostomy in place [facility])   . Dizziness    a. chronic. Admission for this 07/18/2014  . DM type 2, uncontrolled, with renal complications ([place])   . Dysrhythmia   . Essential hypertension, benign   . GERD (gastroesophageal reflux disease)   . HCAP (healthcare-associated pneumonia) 07/21/2015  . Hematuria   . History of blood transfusion    "I've had 2 units so far this year" (09/27/2015)  . HLD (hyperlipidemia)   . Ischemic cardiomyopathy    EF 18% by nuclear study 2016, multiple myocardial infarctions in past    .  Myocardial infarction ([place]) 2001  . Obesity   . Orthostatic hypotension   . OSA on CPAP   . Paroxysmal atrial fibrillation (HCC)    a. on amiodarone, digoxin and Eliquis  . PONV (postoperative nausea and vomiting)   . Presence of permanent cardiac pacemaker   . Prostate cancer ([place])    a. s/p seed implants with chemo and radiation  . SAH (subarachnoid hemorrhage) ([place])    post-traumatic (fall) [facility] 12/2014  . TIA (transient ischemic attack)     ROS:   All systems reviewed and negative except as noted in the HPI.   Past Surgical History:  Procedure Laterality Date  . A/V FISTULAGRAM Right 05/04/2018   Procedure: A/V FISTULAGRAM;  Surgeon: Conrad Le Sueur, MD;  Location: [place] CV LAB;  Service: Cardiovascular;  Laterality: Right;    . Abdominal and Perineal Resection of Rectum with Total Mesorectal Excision  10/04/2007  . AV FISTULA PLACEMENT Right 09/16/2015   Procedure: ARTERIOVENOUS (AV) FISTULA CREATION - BRACHIOCEPHALIC;  Surgeon: Elam Dutch, MD;  Location: [place];  Service: Vascular;  Laterality: Right;  . BI-VENTRICULAR IMPLANTABLE CARDIOVERTER DEFIBRILLATOR  (CRT-D)  2009  . CARDIAC CATHETERIZATION  08/2001; 2009   ; [name] 07/10/2013  . CARDIAC DEFIBRILLATOR PLACEMENT  2002  . CARDIAC DEFIBRILLATOR PLACEMENT  2009   Upgraded to a BiV ICD  . COLONOSCOPY  09/14/2011   Dr. Gala Romney: via colostomy, Single pedunculated benign inflammatory polyp. Due for surveillance Oct 2015  . COLONOSCOPY N/A 07/02/2014   Procedure: COLONOSCOPY;  Surgeon: Daneil Dolin, MD;  [facility];  Service: Endoscopy;  Laterality: N/A;  7:30 / COLONOSCOPY THRU COLOSTOMY  . COLONOSCOPY N/A 12/11/2014   Dr. Gala Romney via colostomy. Normal. Repeat in 2021.   Marland Kitchen COLONOSCOPY N/A 08/24/2015   Dr. Havery Moros: diminutive polyp not removed, stoma site of bleeding  . COLOSTOMY  2008  . CORONARY ANGIOPLASTY WITH STENT PLACEMENT  2001; ~ 2006   "1 + 1"   . ELECTROPHYSIOLOGIC STUDY N/A 08/28/2015   Procedure: AV Node Ablation;  Surgeon: Will Meredith Leeds, MD;  Location: [place] CV LAB;  Service: Cardiovascular;  Laterality: N/A;  . EP IMPLANTABLE DEVICE N/A 04/10/2015   Procedure: Ppm/Biv Ppm Generator Changeout;  Surgeon: Evans Lance, MD;  Location: [place] INVASIVE CV LAB CUPID;  Service: Cardiovascular;  Laterality: N/A;  . ERCP N/A 06/11/2016   Procedure: ENDOSCOPIC RETROGRADE CHOLANGIOPANCREATOGRAPHY (ERCP);  Surgeon: Teena Irani, MD;  Location: [facility] ENDOSCOPY;  Service: Endoscopy;  Laterality: N/A;  . ERCP N/A 01/26/2017   Procedure: ENDOSCOPIC RETROGRADE CHOLANGIOPANCREATOGRAPHY (ERCP);  Surgeon: Teena Irani, MD;  Location: [facility] ENDOSCOPY;  Service: Endoscopy;  Laterality: N/A;  . ERCP  06/2016   Dr. Amedeo Plenty: duodenal fistula, dilated bile duct but  no obvious stones, s/p sphincterotomy and stent placement  . ERCP  01/2017   Dr. Amedeo Plenty: CBD stones, s/p sphincterotomy, balloon extraction, stent removal  . ESOPHAGOGASTRODUODENOSCOPY N/A 07/02/2014   Procedure: ESOPHAGOGASTRODUODENOSCOPY (EGD);  Surgeon: Daneil Dolin, MD;  [facility];  Service: Endoscopy;  Laterality: N/A;  7:30  . ESOPHAGOGASTRODUODENOSCOPY N/A 12/11/2014   DJS:HFWYOV EGD  . ESOPHAGOGASTRODUODENOSCOPY (EGD) WITH PROPOFOL N/A 04/18/2017   Procedure: ESOPHAGOGASTRODUODENOSCOPY (EGD) WITH PROPOFOL;  Surgeon: Daneil Dolin, MD;  [facility];  Service: Endoscopy;  Laterality: N/A;  2:30pm  . GASTROINTESTINAL STENT REMOVAL N/A 01/26/2017   Procedure: GASTROINTESTINAL STENT REMOVAL;  Surgeon: Teena Irani, MD;  Location: [place];  Service: Endoscopy;  Laterality: N/A;  .  GIVENS CAPSULE STUDY N/A 07/23/2015   Dr. Michail Sermon: small non-bleeding AVM, mild gastritis  . INSERTION OF DIALYSIS CATHETER Right 05/04/2018   Procedure: INSERTION OF DIALYSIS CATHETER;  Surgeon: Angelia Mould, MD;  Location: [place];  Service: Vascular;  Laterality: Right;  . IR FLUORO GUIDE CV LINE RIGHT  07/27/2017  . IR REMOVAL TUN CV CATH W/O FL  08/12/2017  . IR THORACENTESIS ASP PLEURAL SPACE W/IMG GUIDE  11/07/2017  . IR THORACENTESIS ASP PLEURAL SPACE W/IMG GUIDE  05/30/2018  . IR US GUIDE VASC ACCESS RIGHT  07/27/2017  . LEFT HEART CATHETERIZATION WITH CORONARY ANGIOGRAM N/A 07/13/2013   Procedure: LEFT HEART CATHETERIZATION WITH CORONARY ANGIOGRAM;  Surgeon: Lorretta Harp, MD;  Location: [facility] CATH LAB;  Service: Cardiovascular;  Laterality: N/A;  . Venia Minks DILATION N/A 07/02/2014   Procedure: Venia Minks DILATION;  Surgeon: Daneil Dolin, MD;  [facility];  Service: Endoscopy;  Laterality: N/A;  7:30  . MALONEY DILATION N/A 04/18/2017   Procedure: Venia Minks DILATION;  Surgeon: Daneil Dolin, MD;  [facility];  Service: Endoscopy;  Laterality: N/A;  .  PORTACATH PLACEMENT  06/2007   "removed ~ 1 yr later"  . RIGHT HEART CATHETERIZATION N/A 02/24/2015   Procedure: RIGHT HEART CATH;  Surgeon: Jolaine Artist, MD;  Location: [facility] CATH LAB;  Service: Cardiovascular;  Laterality: N/A;  . SAVORY DILATION N/A 07/02/2014   Procedure: SAVORY DILATION;  Surgeon: Daneil Dolin, MD;  [facility];  Service: Endoscopy;  Laterality: N/A;  7:30     Family History  Problem Relation Age of Onset  . Colon cancer Mother 53  . Coronary artery disease Father   . Colon cancer Sister 59  . Diabetes Sister   . Colon cancer Other        2 cousins, succumbed to illness     Social History   Socioeconomic History  . Marital status: Married    Spouse name: Not on file  . Number of children: 1  . Years of education: Not on file  . Highest education level: Not on file  Occupational History    Employer: UNEMPLOYED  Social Needs  . Financial resource strain: Not on file  . Food insecurity:    Worry: Not on file    Inability: Not on file  . Transportation needs:    Medical: Not on file    Non-medical: Not on file  Tobacco Use  . Smoking status: Never Smoker  . Smokeless tobacco: Never Used  Substance and Sexual Activity  . Alcohol use: No    Alcohol/week: 0.0 oz    Comment: Former user 45 years ago  . Drug use: No  . Sexual activity: Never  Lifestyle  . Physical activity:    Days per week: Not on file    Minutes per session: Not on file  . Stress: Not on file  Relationships  . Social connections:    Talks on phone: Not on file    Gets together: Not on file    Attends religious service: Not on file    Active member of club or organization: Not on file    Attends meetings of clubs or organizations: Not on file    Relationship status: Not on file  . Intimate partner violence:    Fear of current or ex partner: Not on file    Emotionally abused: Not on file    Physically abused: Not on file    Forced  sexual activity: Not on file    Other Topics Concern  . Not on file  Social History Narrative   Married 89 years   1 son, 2 grandkids    Denies alcohol, drugs, tobacco      BP 102/60 (BP Location: Left Arm)   Pulse 73   Ht 5\' 10"  (1.778 m)   Wt 180 lb (81.6 kg)   SpO2 91%   BMI 25.83 kg/m   Physical Exam:  Chronically ill appearing middle aged man, NAD HEENT: Unremarkable Neck: 7 cm JVD, indwelling right IJ catheter, no thyromegally Lymphatics:  No adenopathy Back:  No CVA tenderness Lungs:  Clear with no wheezes HEART:  Regular rate rhythm, no murmurs, no rubs, no clicks Abd:  soft, positive bowel sounds, no organomegally, no rebound, no guarding Ext:  2 plus pulses, no edema, no cyanosis, no clubbing Skin:  No rashes no nodules Neuro:  CN II through XII intact, motor grossly intact  EKG - none  DEVICE  Normal device function.  See PaceArt for details.   Assess/Plan: 1. VT - he has had no ICD therapies. Multiple NSVT episodes which are asymptomatic 2. Chronic systolic heart failure - his volume status is stable on HD 3. ICD - his St. Jude ICD is working normally.  4. Atrial fib - he is s/p AV node ablation and is asymptomatic.   [name].D.

## 2018-06-28 ENCOUNTER — Encounter (HOSPITAL_COMMUNITY): Payer: Self-pay

## 2018-06-28 ENCOUNTER — Inpatient Hospital Stay (HOSPITAL_COMMUNITY): Payer: Medicare Other

## 2018-06-28 DIAGNOSIS — L8990 Pressure ulcer of unspecified site, unspecified stage: Secondary | ICD-10-CM

## 2018-06-28 DIAGNOSIS — E1129 Type 2 diabetes mellitus with other diabetic kidney complication: Secondary | ICD-10-CM

## 2018-06-28 DIAGNOSIS — I5022 Chronic systolic (congestive) heart failure: Secondary | ICD-10-CM

## 2018-06-28 DIAGNOSIS — E1165 Type 2 diabetes mellitus with hyperglycemia: Secondary | ICD-10-CM

## 2018-06-28 DIAGNOSIS — N186 End stage renal disease: Secondary | ICD-10-CM

## 2018-06-28 DIAGNOSIS — Z992 Dependence on renal dialysis: Secondary | ICD-10-CM

## 2018-06-28 DIAGNOSIS — Z7901 Long term (current) use of anticoagulants: Secondary | ICD-10-CM

## 2018-06-28 LAB — COMPREHENSIVE METABOLIC PANEL
ALBUMIN: 2.6 g/dL — AB (ref 3.5–5.0)
ALK PHOS: 162 U/L — AB (ref 38–126)
ALT: 13 U/L (ref 0–44)
AST: 24 U/L (ref 15–41)
Anion gap: 9 (ref 5–15)
BUN: 43 mg/dL — ABNORMAL HIGH (ref 8–23)
CALCIUM: 8.8 mg/dL — AB (ref 8.9–10.3)
CHLORIDE: 100 mmol/L (ref 98–111)
CO2: 27 mmol/L (ref 22–32)
CREATININE: 2.99 mg/dL — AB (ref 0.61–1.24)
GFR calc non Af Amer: 21 mL/min — ABNORMAL LOW (ref >60)
GFR, EST AFRICAN AMERICAN: 24 mL/min — AB (ref >60)
GLUCOSE: 125 mg/dL — AB (ref 70–99)
Potassium: 4.1 mmol/L (ref 3.5–5.1)
SODIUM: 136 mmol/L (ref 135–145)
Total Bilirubin: 2.1 mg/dL — ABNORMAL HIGH (ref 0.3–1.2)
Total Protein: 6.3 g/dL — ABNORMAL LOW (ref 6.5–8.1)

## 2018-06-28 LAB — PROTIME-INR
INR: 2.76
INR: 2.44
Prothrombin Time: 28.9 seconds — ABNORMAL HIGH (ref 11.4–15.2)
Prothrombin Time: 26.3 seconds — ABNORMAL HIGH (ref 11.4–15.2)

## 2018-06-28 LAB — GLUCOSE, CAPILLARY
GLUCOSE-CAPILLARY: 74 mg/dL (ref 70–99)
GLUCOSE-CAPILLARY: 108 mg/dL — AB (ref 70–99)
GLUCOSE-CAPILLARY: 82 mg/dL (ref 70–99)
Glucose-Capillary: 102 mg/dL — ABNORMAL HIGH (ref 70–99)
Glucose-Capillary: 130 mg/dL — ABNORMAL HIGH (ref 70–99)
Glucose-Capillary: 93 mg/dL (ref 70–99)

## 2018-06-28 LAB — MRSA PCR SCREENING: MRSA by PCR: NEGATIVE

## 2018-06-28 LAB — MAGNESIUM: MAGNESIUM: 1.8 mg/dL (ref 1.7–2.4)

## 2018-06-28 MED ORDER — SODIUM CHLORIDE 0.9 % IV SOLN: 100.0000 mL | INTRAVENOUS | Status: DC | PRN

## 2018-06-28 MED ORDER — HEPARIN SODIUM (PORCINE) 1000 UNIT/ML IJ SOLN
INTRAMUSCULAR | Status: AC
  Administered 2018-06-28: 1000 [IU] via INTRAVENOUS_CENTRAL
  Filled 2018-06-28: qty 7

## 2018-06-28 MED ORDER — HEPARIN SODIUM (PORCINE) 1000 UNIT/ML DIALYSIS
1000.0000 [IU] | INTRAMUSCULAR | Status: DC | PRN
  Administered 2018-06-28: 1000 [IU] via INTRAVENOUS_CENTRAL
  Administered 2018-06-30: 3400 [IU] via INTRAVENOUS_CENTRAL

## 2018-06-28 MED ORDER — ALTEPLASE 2 MG IJ SOLR: 2.0000 mg | Freq: Once | INTRAMUSCULAR | Status: DC | PRN

## 2018-06-28 MED ORDER — INSULIN ASPART 100 UNIT/ML ~~LOC~~ SOLN
0.0000 [IU] | Freq: Three times a day (TID) | SUBCUTANEOUS | Status: DC
  Administered 2018-06-29: 1 [IU] via SUBCUTANEOUS

## 2018-06-28 MED ORDER — CHLORHEXIDINE GLUCONATE CLOTH 2 % EX PADS
6.0000 | MEDICATED_PAD | Freq: Every day | CUTANEOUS | Status: DC
  Administered 2018-06-29 – 2018-07-01 (×3): 6 via TOPICAL

## 2018-06-28 MED ORDER — INSULIN ASPART 100 UNIT/ML ~~LOC~~ SOLN
3.0000 [IU] | Freq: Three times a day (TID) | SUBCUTANEOUS | Status: DC
  Administered 2018-06-28 – 2018-07-01 (×8): 3 [IU] via SUBCUTANEOUS

## 2018-06-28 MED ORDER — CEPHALEXIN 250 MG PO CAPS
250.0000 mg | ORAL_CAPSULE | Freq: Two times a day (BID) | ORAL | Status: DC
  Administered 2018-06-28 – 2018-07-01 (×7): 250 mg via ORAL
  Filled 2018-06-28 (×9): qty 1

## 2018-06-28 MED ORDER — INSULIN ASPART 100 UNIT/ML ~~LOC~~ SOLN: 0.0000 [IU] | Freq: Every day | SUBCUTANEOUS | Status: DC

## 2018-06-28 MED ORDER — DEXTROSE 5 % IV SOLN
5.0000 mg | Freq: Once | INTRAVENOUS | Status: AC
  Administered 2018-06-28: 5 mg via INTRAVENOUS
  Filled 2018-06-28: qty 0.5

## 2018-06-28 MED ORDER — SEVELAMER CARBONATE 800 MG PO TABS: 800.0000 mg | ORAL_TABLET | ORAL | Status: DC | PRN

## 2018-06-28 NOTE — Consult Note (Signed)
[place] Nurse wound consult note Assessment completed via remote camera with assistance of primary RN, Anderson Malta.  Patient's spouse present. Reason for Consult: Bilateral lower leg wounds and sacral wound.  Please refer to primary RN flowsheet for measurements. Wound type: Bilateral lower leg wound etiology is unclear.  Per the RN, the patient has +1 edema to lower legs.  The patient states these wounds have been present for an extended period of time, that home health comes and places Xeroform gauze, dry gauze, and kerlex over his lower legs weekly.  He states they do not apply any type of compression to the legs such as Coban or Ace Wraps.  Both lower legs have scattered open wounds, the RN reports that they appear as if they were fluid filled blisters that popped, and are now without their protective epidermal layer.  The patient agrees this is what has been happening.  Plan for these areas are to continue the Xeroform gauze, dry gauze, and kerlex on an every 2 day schedule.  The sacrum/coccyx has a long-term pressure wound that the RN reports has a dry yellow wound bed, without odor.  The patient states he treats this with a wet to dry dressing daily, covered with a foam dressing.  I suggested using wet to dry twice daily with an ABD pad taped in place, however, his spouse states that all tapes tear his skin.  In light of that information, the plan is to continue his home therapy while in the hospital.   Monitor the wound area(s) for worsening of condition such as: Signs/symptoms of infection,  Increase in size,  Development of or worsening of odor, Development of pain, or increased pain at the affected locations.  Notify the medical team if any of these develop.  Thank you for the consult.  Discussed plan of care with the patient and bedside nurse.  [place] nurse will not follow at this time.  Please re-consult the [place] team if needed.  Val Riles, RN, MSN, CWOCN, CNS-BC, pager [phone]

## 2018-06-28 NOTE — Progress Notes (Signed)
Patient was placed on 4lpm [place] to transport to ICU; arrived without any incidents. Patient is currently on standby with the BIPAP and speaking with the nurse about his condition. Will continue to monitor patient while in ICU.

## 2018-06-28 NOTE — Progress Notes (Signed)
Pt refused for this RN to change his HD catheter dressing because he stated he could only use paper tape in that area d/t his skin ripping. This RN showed pt the dressing that is supposed to be placed on catheter and pt still refused. Pt is due for HD today- will pass on to day shift and HD nurse will change dressing.

## 2018-06-28 NOTE — Progress Notes (Signed)
PROGRESS NOTE    BERNHARD KOSKINEN  OHY:[id number] DOB: 11-23-1956 DOA: 06/27/2018 PCP: Redmond School, MD     Brief Narrative:  62 year old man admitted from home on 7/23 due to shortness of breath. He has a history of ischemic cardiomyopathy with unknown ejection fraction of 20 to 25% last seen on echo in April 2019, he is status post ICD placement, also has end-stage renal disease and is maintained on hemodialysis on Monday, Wednesday, Friday, also history of atrial fibrillation maintained on Coumadin status post AV node ablation, type 2 diabetes, hypothyroidism, hyperlipidemia and recurrent thoracentesis secondary to pleural effusions.  INR and admission was 2.85 was given 1 mg of oral vitamin K on 7/23 without much improvement in his INR.   Assessment & Plan:   Principal Problem:   Recurrent pleural effusion on right Active Problems:   Ischemic cardiomyopathy-EF 35% 04/24/15 echo   Long term current use of anticoagulant therapy   DM type 2, uncontrolled, with renal complications (HCC)   BiV ICD (St Jude) gen change 04/10/15   Anemia of chronic disease   Thrombocytopenia (HCC)   Chronic systolic heart failure ([place])   ESRD on dialysis ([place])   Heart failure (HCC)   Pressure injury of skin   Recurrent right-sided pleural effusion -Suspect transudative due to cardiomyopathy with low ejection fraction. -Unfortunately his INR is therapeutic at 2.7 which precludes ultrasound-guided thoracentesis at this time. -We will give 5 mg of IV vitamin K, recheck INR this afternoon, will try and make INR around 1.5 in order to facilitate thoracentesis in a.m.  Acute on chronic systolic heart failure decompensation -Patient is not on any diuretic therapy at home due to chronic hypotension, it appears volume status is managed through hemodialysis.  End-stage renal disease -On hemodialysis Monday, Wednesday, Friday. -Nephrology has been consulted for dialysis needs, patient is due for dialysis  today.  Atrial fibrillation, paroxysmal -status post AV node ablation with ICD -Continue to hold warfarin, will reverse with IV vitamin K in anticipation of thoracentesis in a.m.  Hypothyroidism -Continue Synthroid  Hyperlipidemia -Continue atorvastatin  GERD -Maintain on Protonix  Chronic thrombocytopenia  -stable, monitor with serial CBCs.  Sacral decubitus ulcer of the sacrum -Stage III, continue wound care recommendations. -Present prior to admission   DVT prophylaxis: Warfarin currently on hold, INR remains therapeutic Code Status: DNR Family Communication: Wife at bedside updated on plan of care and all questions answered Disposition Plan: Keep in ICU in case needs BiPAP again, thoracentesis delayed until 7/25 due to therapeutic INR.  Consultants:   None  Procedures:   Ultrasound-guided thoracentesis planned for 7/25  Antimicrobials:  Anti-infectives (From admission, onward)   None       Subjective: Lying in bed, still feels short of breath, denies chest pain  Objective: Vitals:   06/28/18 0742 06/28/18 0800 06/28/18 0900 06/28/18 1000  BP:  99/65 (!) 108/59 109/62  Pulse: 70 72 70 72  Resp: 13 13 20  (!) 23  Temp: 97.8 F (36.6 C)     TempSrc: Oral     SpO2: 100% 100% 92% 100%  Weight:      Height:        Intake/Output Summary (Last 24 hours) at 06/28/2018 1033 Last data filed at 06/28/2018 1025 Gross per 24 hour  Intake 123 ml  Output -  Net 123 ml   Filed Weights   06/27/18 1834 06/28/18 0100  Weight: 81.6 kg (180 lb) 80.9 kg (178 lb 5.6 oz)    Examination:  General exam: Alert, awake, oriented x 3 Respiratory system: Muffled breath sounds to right base and right midlung fields. Cardiovascular system:RRR. No murmurs, rubs, gallops. Gastrointestinal system: Abdomen is nondistended, soft and nontender. No organomegaly or masses felt. Normal bowel sounds heard. Central nervous system: Alert and oriented. No focal neurological  deficits. Extremities: No C/C/E, +pedal pulses Skin: No rashes, lesions or ulcers Psychiatry: Judgement and insight appear normal. Mood & affect appropriate.     Data Reviewed: I have personally reviewed following labs and imaging studies  CBC: Recent Labs  Lab 06/27/18 1939  WBC 7.0  NEUTROABS 6.2  HGB 11.3*  HCT 34.8*  MCV 111.5*  PLT 81*   Basic Metabolic Panel: Recent Labs  Lab 06/27/18 1939 06/28/18 0354  NA 134* 136  K 4.0 4.1  CL 98 100  CO2 27 27  GLUCOSE 159* 125*  BUN 41* 43*  CREATININE 2.83* 2.99*  CALCIUM 8.7* 8.8*  MG 1.8  --    GFR: Estimated Creatinine Clearance: 26.1 mL/min (A) (by C-G formula based on SCr of 2.99 mg/dL (H)). Liver Function Tests: Recent Labs  Lab 06/27/18 1939 06/28/18 0354  AST 25 24  ALT 15 13  ALKPHOS 179* 162*  BILITOT 2.0* 2.1*  PROT 6.4* 6.3*  ALBUMIN 2.7* 2.6*   No results for input(s): LIPASE, AMYLASE in the last 168 hours. Recent Labs  Lab 06/27/18 1939  AMMONIA 23   Coagulation Profile: Recent Labs  Lab 06/27/18 06/27/18 1939 06/28/18 0354  INR 3.3* 2.85 2.76   Cardiac Enzymes: No results for input(s): CKTOTAL, CKMB, CKMBINDEX, TROPONINI in the last 168 hours. BNP (last 3 results) No results for input(s): PROBNP in the last 8760 hours. HbA1C: No results for input(s): HGBA1C in the last 72 hours. CBG: Recent Labs  Lab 06/28/18 0128 06/28/18 0420 06/28/18 0747  GLUCAP 102* 130* 74   Lipid Profile: No results for input(s): CHOL, HDL, LDLCALC, TRIG, CHOLHDL, LDLDIRECT in the last 72 hours. Thyroid Function Tests: No results for input(s): TSH, T4TOTAL, FREET4, T3FREE, THYROIDAB in the last 72 hours. Anemia Panel: No results for input(s): VITAMINB12, FOLATE, FERRITIN, TIBC, IRON, RETICCTPCT in the last 72 hours. Urine analysis:    Component Value Date/Time   COLORURINE YELLOW 07/09/2017 2215   APPEARANCEUR HAZY (A) 07/09/2017 2215   LABSPEC 1.015 07/09/2017 2215   PHURINE 5.0 07/09/2017 2215    GLUCOSEU 100 (A) 07/09/2017 2215   HGBUR TRACE (A) 07/09/2017 2215   BILIRUBINUR NEGATIVE 07/09/2017 2215   KETONESUR NEGATIVE 07/09/2017 2215   PROTEINUR NEGATIVE 07/09/2017 2215   UROBILINOGEN 1.0 08/23/2015 0322   NITRITE NEGATIVE 07/09/2017 2215   LEUKOCYTESUR TRACE (A) 07/09/2017 2215   Sepsis Labs: @LABRCNTIP (procalcitonin:4,lacticidven:4)  ) Recent Results (from the past 240 hour(s))  MRSA PCR Screening     Status: None   Collection Time: 06/28/18 12:52 AM  Result Value Ref Range Status   MRSA by PCR NEGATIVE NEGATIVE Final    Comment:        The GeneXpert MRSA Assay (FDA approved for NASAL specimens only), is one component of a comprehensive MRSA colonization surveillance program. It is not intended to diagnose MRSA infection nor to guide or monitor treatment for MRSA infections. Performed at [facility], [address] [place],  [address]          Radiology Studies: Dg Chest [facility] 1 View  Result Date: 06/27/2018 CLINICAL DATA:  Shortness of breath. EXAM: PORTABLE CHEST 1 VIEW COMPARISON:  Radiograph of May 30, 2018. FINDINGS: Stable  cardiomegaly. Left-sided pacemaker is unchanged in position. Right internal jugular dialysis catheter is unchanged in position. Left lung is clear. Large right pleural effusion is noted which is increased compared to prior exam, with associated atelectasis or infiltrate. No pneumothorax is seen. Bony thorax is unremarkable. IMPRESSION: Large right pleural effusion is noted which is increased in size compared to prior exam, with probable underlying atelectasis or infiltrate. Electronically Signed   By: Marijo Conception, M.D.   On: 06/27/2018 19:18        Scheduled Meds: . atorvastatin  10 mg Oral Daily  . fluticasone  2 spray Each Nare Daily  . insulin aspart  0-5 Units Subcutaneous QHS  . insulin aspart  0-9 Units Subcutaneous TID WC  . insulin aspart  3 Units Subcutaneous TID WC  . levothyroxine  75 mcg Oral QAC  breakfast  . loratadine  10 mg Oral Daily  . midodrine  15 mg Oral BID WC  . pantoprazole  40 mg Oral Daily  . sevelamer carbonate  1,600 mg Oral TID WC  . sodium chloride flush  3 mL Intravenous Q12H  . sucroferric oxyhydroxide  500 mg Oral TID WC   Continuous Infusions: . sodium chloride    . phytonadione (VITAMIN K) IV       LOS: 1 day    Time spent: 30 minutes. Greater than 50% of this time was spent in direct contact with the patient and with patient's wife, coordinating care and discussing relevant ongoing clinical issues, including plans for reversing INR in order to perform thoracentesis hopefully over the next 24 hours, need for continued stay in the ICU in case he needs to go back on noninvasive positive pressure ventilation.     Lelon Frohlich, MD [facility] Pager [phone]  If 7PM-7AM, please contact night-coverage [email or web address] Password [email or web address] 06/28/2018, 10:33 AM

## 2018-06-28 NOTE — Progress Notes (Signed)
Pt is in no distress. BIPAP is not needed at this time

## 2018-06-28 NOTE — Consult Note (Addendum)
[name] MRN: [id number] DOB/AGE: 1956/01/10 62 y.o. Primary Care Physician:Fusco, Lyman Bishop, MD Admit date: 06/27/2018 Chief Complaint:  Chief Complaint  Patient presents with  . Shortness of Breath   HPI: Pt is a 62 year old male with past medical history of ischemic cardiomyopathy with EF 20 to 25% , status post ICD placement, End-stage renal disease on and recurrent thoracentesis who came to  Emergency room with c/o shortness of breath as well as right-sided substernal chest pain that is only noticed with deep inspiration.    HPI dates back to yesterday when pt went to see his cardiologist Dr. Ladona Ridgel and found to be okay and  was instructed to follow-up again in 1 year.  Later part of the day pt developed dyspnea and chest pain. EMS was called and  noted  To have Systolic blood pressure of 80s Evalauation in ER pt was placed on BiPAP due to some mild respiratory distress the patient had CXR done which showed  a large right-sided pleural effusion  Pt seen today in ICU NO c/o Fever/ chills,  Pt denies any chest pain currently.  Pt offers  No c/o lower extremity edema . NO c/o any palpitations, lightheadedness, or dizziness.  NO c/o nausea/ vomiting/abdominal pain NO c/o syncope NO c/o hematuria      Past Medical History:  Diagnosis Date  . Adenocarcinoma of rectum (HCC)    a. 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 AICD (automatic cardioverter/defibrillator) present 2002   BI V ICD  . Anemia   . CAD (coronary artery disease)    a. BMS to LAD 2001 at [facility] b. PTCA/atherectomy ramus and BMS to LAD 2009  . CHF (congestive heart failure) (HCC)   . Cholelithiasis 06/2015  . Chronic kidney disease, stage IV (severe) (HCC)   . Chronic systolic heart failure (HCC)   . Cirrhosis (HCC)   . Colostomy in place [facility])   . Dizziness    a. chronic. Admission for this 07/18/2014  . DM type 2, uncontrolled, with renal complications (HCC)   . Dysrhythmia   . Essential hypertension, benign   . GERD  (gastroesophageal reflux disease)   . HCAP (healthcare-associated pneumonia) 07/21/2015  . Hematuria   . History of blood transfusion    "I've had 2 units so far this year" (09/27/2015)  . HLD (hyperlipidemia)   . Ischemic cardiomyopathy    EF 18% by nuclear study 2016, multiple myocardial infarctions in past    . Myocardial infarction (HCC) 2001  . Obesity   . Orthostatic hypotension   . OSA on CPAP   . Paroxysmal atrial fibrillation (HCC)    a. on amiodarone, digoxin and Eliquis  . PONV (postoperative nausea and vomiting)   . Presence of permanent cardiac pacemaker   . Prostate cancer (HCC)    a. s/p seed implants with chemo and radiation  . SAH (subarachnoid hemorrhage) (HCC)    post-traumatic (fall) [facility] 12/2014  . TIA (transient ischemic attack)        Family History  Problem Relation Age of Onset  . Colon cancer Mother 23  . Coronary artery disease Father   . Colon cancer Sister 74  . Diabetes Sister   . Colon cancer Other        2 cousins, succumbed to illness    Social History:  reports that he has never smoked. He has never used smokeless tobacco. He reports that he does not drink alcohol or use drugs.   Allergies: No Known Allergies  Medications Prior  to Admission  Medication Sig Dispense Refill  . ALPRAZolam (XANAX) 0.5 MG tablet Take 0.5 mg by mouth 3 (three) times daily as needed.  2  . aspirin EC 81 MG tablet Take 1 tablet (81 mg total) by mouth daily. 150 tablet 2  . atorvastatin (LIPITOR) 10 MG tablet Take 1 tablet (10 mg total) by mouth daily. 90 tablet 3  . cephALEXin (KEFLEX) 250 MG capsule Take 250 capsules by mouth 2 (two) times daily. Staph infection    . fexofenadine (ALLEGRA) 180 MG tablet Take 180 mg by mouth daily.    . fluticasone (FLONASE) 50 MCG/ACT nasal spray Place 2 sprays into both nostrils daily.  11  . levothyroxine (SYNTHROID, LEVOTHROID) 75 MCG tablet Take 75 mcg by mouth daily before breakfast.    . midodrine (PROAMATINE) 5 MG  tablet TAKE 3 TABLETS (15 MG TOTAL) BY MOUTH 2 (TWO) TIMES DAILY WITH A MEAL. 180 tablet 3  . nitroGLYCERIN (NITROSTAT) 0.4 MG SL tablet Place 1 tablet (0.4 mg total) under the tongue every 5 (five) minutes as needed for chest pain. 25 tablet 3  . NOVOLOG FLEXPEN 100 UNIT/ML FlexPen Inject 3 Units into the skin 3 (three) times daily as needed for high blood sugar.   11  . Oxycodone HCl 10 MG TABS Take 10 mg by mouth every 4 (four) hours as needed. For pain  0  . pantoprazole (PROTONIX) 40 MG tablet Take 1 tablet (40 mg total) by mouth daily. 30 tablet 0  . PROAIR RESPICLICK 108 (90 BASE) MCG/ACT AEPB Inhale 1 puff into the lungs every 6 (six) hours as needed (for breathing).   0  . sevelamer carbonate (RENVELA) 800 MG tablet Take 800-1,600 mg by mouth See admin instructions. 2 tablets with meals and 1 tablet with snacks    . sucroferric oxyhydroxide (VELPHORO) 500 MG chewable tablet Chew 1 tablet (500 mg total) by mouth 3 (three) times daily with meals. 90 tablet 0  . warfarin (COUMADIN) 1 MG tablet TAKE 1 TABLET BY MOUTH EVERY DAY 30 tablet 3  . glucose blood test strip Use to test blood sugar 3 times daily 100 each 4       UJW:[id number] from the symptoms mentioned above,there are no other symptoms referable to all systems reviewed.  Marland Kitchen atorvastatin  10 mg Oral Daily  . fluticasone  2 spray Each Nare Daily  . insulin aspart  0-5 Units Subcutaneous QHS  . insulin aspart  0-9 Units Subcutaneous Q4H  . levothyroxine  75 mcg Oral QAC breakfast  . loratadine  10 mg Oral Daily  . midodrine  15 mg Oral BID WC  . pantoprazole  40 mg Oral Daily  . sevelamer carbonate  1,600 mg Oral TID WC  . sodium chloride flush  3 mL Intravenous Q12H  . sucroferric oxyhydroxide  500 mg Oral TID WC      Physical Exam: Vital signs in last 24 hours: Temp:  [97.4 F (36.3 C)-97.8 F (36.6 C)] 97.8 F (36.6 C) (07/24 0742) Pulse Rate:  [67-76] 72 (07/24 0800) Resp:  [11-22] 13 (07/24 0800) BP:  (81-106)/(47-79) 99/65 (07/24 0800) SpO2:  [95 %-100 %] 100 % (07/24 0800) Weight:  [178 lb 5.6 oz (80.9 kg)-180 lb (81.6 kg)] 178 lb 5.6 oz (80.9 kg) (07/24 0100) Weight change:  Last BM Date: 06/28/18  Intake/Output from previous day: 07/23 0701 - 07/24 0700 In: 120 [P.O.:120] Out: -  No intake/output data recorded.   Physical Exam: General- pt is awake,alert,  oriented to time place and person Resp- No acute REsp distress, decreased at bases CVS- S1S2 regular in rate and rhythm GIT- BS+, soft, NT, ND EXT- NO LE Edema, Cyanosis CNS- CN 2-12 grossly intact. Moving all 4 extremities Psych- normal mod and affect Access- PC  In situ              Right AVF clotted    Lab Results: CBC Recent Labs    06/27/18 1939  WBC 7.0  HGB 11.3*  HCT 34.8*  PLT 81*    BMET Recent Labs    06/27/18 1939 06/28/18 0354  NA 134* 136  K 4.0 4.1  CL 98 100  CO2 27 27  GLUCOSE 159* 125*  BUN 41* 43*  CREATININE 2.83* 2.99*  CALCIUM 8.7* 8.8*    MICRO Recent Results (from the past 240 hour(s))  MRSA PCR Screening     Status: None   Collection Time: 06/28/18 12:52 AM  Result Value Ref Range Status   MRSA by PCR NEGATIVE NEGATIVE Final    Comment:        The GeneXpert MRSA Assay (FDA approved for NASAL specimens only), is one component of a comprehensive MRSA colonization surveillance program. It is not intended to diagnose MRSA infection nor to guide or monitor treatment for MRSA infections. Performed at [facility], [address] [place], [place] [address]       Lab Results  Component Value Date   PTH 58 07/12/2017   PTH Comment 07/12/2017   CALCIUM 8.8 (L) 06/28/2018   CAION 0.82 (LL) 05/04/2018   PHOS 3.6 09/15/2017      Impression: 1)Renal ESRD on HD               Pt is on Monday/Wednesday/Friday schedule               Will dialyze pt today   2)CVS hx of Hypotension            On Midodrine   3)Anemia In ESRD the goal for HGb is 9--11. HGb near  to goal  4)CKD Mineral-Bone Disorder PTH over suppressed Phosphorus at goal.   5)Resp-admitted with recurrent pleural effusion Primary MD following  6)FEN  Normokalemic NOrmonatremic Euvolemic Albumin -Low/Normal  7)Acid base Co2 at goal     Plan:  Will dialyze today Will use 2k bath Will not give any heparin Will use step w rest profile as pt is hypotensive Will trt to remove 2 liters   Addendum Pt seen on HD Pt tolerating tx well.  Calloway Andrus S 06/28/2018, 9:27 AM

## 2018-06-28 NOTE — Procedures (Signed)
     HEMODIALYSIS TREATMENT NOTE:   4.25 hour heparin-free dialysis completed via right IJ tunneled catheter. Exit site unremarkable. Goal met: 2 liters removed without interruption in ultrafiltration.  All blood was returned.     Rockwell Alexandria, RN CDN

## 2018-06-29 LAB — BASIC METABOLIC PANEL
ANION GAP: 9 (ref 5–15)
BUN: 28 mg/dL — ABNORMAL HIGH (ref 8–23)
CO2: 28 mmol/L (ref 22–32)
Calcium: 8.7 mg/dL — ABNORMAL LOW (ref 8.9–10.3)
Chloride: 98 mmol/L (ref 98–111)
Creatinine, Ser: 2.29 mg/dL — ABNORMAL HIGH (ref 0.61–1.24)
GFR calc Af Amer: 33 mL/min — ABNORMAL LOW (ref >60)
GFR, EST NON AFRICAN AMERICAN: 29 mL/min — AB (ref >60)
GLUCOSE: 112 mg/dL — AB (ref 70–99)
POTASSIUM: 3.8 mmol/L (ref 3.5–5.1)
Sodium: 135 mmol/L (ref 135–145)

## 2018-06-29 LAB — CBC
HEMATOCRIT: 32 % — AB (ref 39.0–52.0)
HEMOGLOBIN: 10.3 g/dL — AB (ref 13.0–17.0)
MCH: 36 pg — ABNORMAL HIGH (ref 26.0–34.0)
MCHC: 32.2 g/dL (ref 30.0–36.0)
MCV: 111.9 fL — ABNORMAL HIGH (ref 78.0–100.0)
Platelets: 81 10*3/uL — ABNORMAL LOW (ref 150–400)
RBC: 2.86 MIL/uL — ABNORMAL LOW (ref 4.22–5.81)
RDW: 15.5 % (ref 11.5–15.5)
WBC: 5.6 10*3/uL (ref 4.0–10.5)

## 2018-06-29 LAB — GLUCOSE, CAPILLARY
GLUCOSE-CAPILLARY: 89 mg/dL (ref 70–99)
GLUCOSE-CAPILLARY: 119 mg/dL — AB (ref 70–99)
GLUCOSE-CAPILLARY: 132 mg/dL — AB (ref 70–99)
Glucose-Capillary: 105 mg/dL — ABNORMAL HIGH (ref 70–99)
Glucose-Capillary: 112 mg/dL — ABNORMAL HIGH (ref 70–99)

## 2018-06-29 LAB — PROTIME-INR
INR: 1.73
INR: 1.6
PROTHROMBIN TIME: 20.1 s — AB (ref 11.4–15.2)
PROTHROMBIN TIME: 18.9 s — AB (ref 11.4–15.2)

## 2018-06-29 MED ORDER — VITAMIN K1 10 MG/ML IJ SOLN
5.0000 mg | Freq: Once | INTRAMUSCULAR | Status: AC
  Administered 2018-06-29: 5 mg via INTRAVENOUS
  Filled 2018-06-29: qty 0.5

## 2018-06-29 MED ORDER — ORAL CARE MOUTH RINSE
15.0000 mL | Freq: Two times a day (BID) | OROMUCOSAL | Status: DC
  Administered 2018-06-29 – 2018-07-01 (×4): 15 mL via OROMUCOSAL

## 2018-06-29 MED ORDER — VITAMIN K1 10 MG/ML IJ SOLN
INTRAMUSCULAR | Status: AC
  Filled 2018-06-29: qty 1

## 2018-06-29 MED ORDER — VITAMIN K1 10 MG/ML IJ SOLN
5.0000 mg | Freq: Once | INTRAVENOUS | Status: AC
  Administered 2018-06-29: 5 mg via INTRAVENOUS
  Filled 2018-06-29: qty 0.5

## 2018-06-29 MED ORDER — ALBUMIN HUMAN 25 % IV SOLN: 25.0000 g | Freq: Two times a day (BID) | INTRAVENOUS | Status: DC

## 2018-06-29 MED ORDER — ALBUMIN HUMAN 25 % IV SOLN
25.0000 g | Freq: Two times a day (BID) | INTRAVENOUS | Status: AC
Start: 2018-06-30 — End: 2018-06-30
  Administered 2018-06-30: 25 g via INTRAVENOUS
  Filled 2018-06-29: qty 100

## 2018-06-29 NOTE — Progress Notes (Signed)
Subjective: Interval History: has no complaint of nausea or vomiting.  His breathing is much better.  Presently patient is sitting by bedside and eating his breakfast.  Patient also denies any chest pain..  Objective: Vital signs in last 24 hours: Temp:  [97.4 F (36.3 C)-97.9 F (36.6 C)] 97.9 F (36.6 C) (07/25 0724) Pulse Rate:  [70-82] 74 (07/25 0724) Resp:  [10-24] 24 (07/25 0724) BP: (79-120)/(48-95) 84/66 (07/25 0415) SpO2:  [92 %-100 %] 99 % (07/25 0724) Weight:  [80.8 kg (178 lb 2.1 oz)-80.9 kg (178 lb 5.6 oz)] 80.9 kg (178 lb 5.6 oz) (07/25 0300) Weight change: -0.847 kg (-1 lb 13.9 oz)  Intake/Output from previous day: 07/24 0701 - 07/25 0700 In: 298.8 [P.O.:240; I.V.:3; IV Piggyback:55.8] Out: 2100 [Urine:100] Intake/Output this shift: No intake/output data recorded.  General appearance: alert, cooperative and no distress Resp: diminished breath sounds RLL and rales bilaterally Cardio: regular rate and rhythm Extremities: No edema  Lab Results: Recent Labs    06/27/18 1939 06/29/18 0418  WBC 7.0 5.6  HGB 11.3* 10.3*  HCT 34.8* 32.0*  PLT 81* 81*   BMET:  Recent Labs    06/28/18 0354 06/29/18 0418  NA 136 135  K 4.1 3.8  CL 100 98  CO2 27 28  GLUCOSE 125* 112*  BUN 43* 28*  CREATININE 2.99* 2.29*  CALCIUM 8.8* 8.7*   No results for input(s): PTH in the last 72 hours. Iron Studies: No results for input(s): IRON, TIBC, TRANSFERRIN, FERRITIN in the last 72 hours.  Studies/Results: Dg Chest Port 1 View  Result Date: 06/27/2018 CLINICAL DATA:  Shortness of breath. EXAM: PORTABLE CHEST 1 VIEW COMPARISON:  Radiograph of May 30, 2018. FINDINGS: Stable cardiomegaly. Left-sided pacemaker is unchanged in position. Right internal jugular dialysis catheter is unchanged in position. Left lung is clear. Large right pleural effusion is noted which is increased compared to prior exam, with associated atelectasis or infiltrate. No pneumothorax is seen. Bony thorax  is unremarkable. IMPRESSION: Large right pleural effusion is noted which is increased in size compared to prior exam, with probable underlying atelectasis or infiltrate. Electronically Signed   By: Marijo Conception, M.D.   On: 06/27/2018 19:18    I have reviewed the patient's current medications.  Assessment/Plan: 1] difficulty breathing: Possibly a combination of fluid overload and pleural effusion.  Patient was dialyzed yesterday with 2 L fluid removal presently feeling much better.  Because of recurrent hypotension very difficult to remove more fluid. 2] recurrent pleural effusion.  Presently patient with significant right pleural effusion which has increased from before.  Patient is for possible thoracentesis today. 3] hypotension: This is a recurrent problem.  Most likely during dialysis. 4] anemia: His hemoglobin is within our target goal 5] diabetes 6] bone and mineral disorder: His calcium is a range 7] history of ischemic cardiomyopathy with very low ejection fraction. 8] atrial fibrillation: His heart rate is controlled.  Patient also with pacemaker. Plan: 1] we will make arrangement for patient to get dialysis tomorrow 2] we will use albumin 25 grams 1 dose during dialysis 3] we will try to remove 2 L if systolic blood pressure remains above 90 4] patient also advised to decrease his salt and fluid intake.   LOS: 2 days   Miracle Mongillo S 06/29/2018,8:42 AM

## 2018-06-29 NOTE — Care Management Note (Signed)
Case Management Note  Patient Details  Name: [name] MRN: [id number] Date of Birth: [birth year]  Subjective/Objective:   Recurrent pleural effusion. EF 20 to 25 %. ESRD of dialysis MWF. Active with [facility] for nursing for wound care.  Has had a SNF stay in 2018.                Action/Plan: Needs thoracentesis. CM following for needs. Brad of [facility] aware of admission.   Expected Discharge Date:   07/01/2018               Expected Discharge Plan:  [place]  In-House Referral:     Discharge planning Services  CM Consult  Post Acute Care Choice:  Home Health, Resumption of Svcs/PTA Provider Choice offered to:     DME Arranged:    DME Agency:     HH Arranged:    [place] Agency:  [place]  Status of Service:     If discussed at [place] of Stay Meetings, dates discussed:    Additional Comments:  Lilyona Richner, Chauncey Reading, RN 06/29/2018, 2:30 PM

## 2018-06-29 NOTE — Progress Notes (Signed)
PROGRESS NOTE    [name]  XBM:[id number] DOB: December 23, 1955 DOA: 06/27/2018 PCP: Redmond School, MD     Brief Narrative:  62 year old man admitted from home on 7/23 due to shortness of breath. He has a history of ischemic cardiomyopathy with unknown ejection fraction of 20 to 25% last seen on echo in April 2019, he is status post ICD placement, also has end-stage renal disease and is maintained on hemodialysis on Monday, Wednesday, Friday, also history of atrial fibrillation maintained on Coumadin status post AV node ablation, type 2 diabetes, hypothyroidism, hyperlipidemia and recurrent thoracentesis secondary to pleural effusions.  INR and admission was 2.85 was given 1 mg of oral vitamin K on 7/23 without much improvement in his INR.   Assessment & Plan:   Principal Problem:   Recurrent pleural effusion on right Active Problems:   Ischemic cardiomyopathy-EF 35% 04/24/15 echo   Long term current use of anticoagulant therapy   DM type 2, uncontrolled, with renal complications (HCC)   BiV ICD (St Jude) gen change 04/10/15   Anemia of chronic disease   Thrombocytopenia (HCC)   Chronic systolic heart failure ([place])   ESRD on dialysis ([place])   Heart failure (HCC)   Pressure injury of skin   Recurrent right-sided pleural effusion -Suspect transudative due to cardiomyopathy with low ejection fraction. -After vitamin K x2, repeat INR this morning was 1.73.  After discussion with interventional radiology, they would prefer INR to be 1.5, hence thoracentesis will be delayed hopefully only another extra day 5 mg of vitamin K were given this morning, repeat INR around 4 PM was 1.6, will give another. -5 mg of IV vitamin K this afternoon and repeat INR in a.m. hopefully this will be around goal to allow for thoracentesis. -Patient had been referred prior to  thoracic surgery for consideration of talc pleurodesis.  I believe that this is an appropriate referral given his multiple thoracentesis,  have advised wife to reschedule appointment.  Acute on chronic systolic heart failure decompensation -Patient is not on any diuretic therapy at home due to chronic hypotension, it appears volume status is managed through hemodialysis.  End-stage renal disease -On hemodialysis Monday, Wednesday, Friday. -Nephrology has been consulted for dialysis needs. -Has so far been dialyzed on schedule.  Atrial fibrillation, paroxysmal -status post AV node ablation with ICD -Continue to hold warfarin, will reverse with IV vitamin K in anticipation of thoracentesis in a.m.  Hypothyroidism -Continue Synthroid  Hyperlipidemia -Continue atorvastatin  GERD -Maintain on Protonix  Chronic thrombocytopenia  -stable, monitor with serial CBCs.  Sacral decubitus ulcer of the sacrum -Stage III, continue wound care recommendations. -Present prior to admission   DVT prophylaxis: Warfarin currently on hold. Resume after thoracentesis without need for bridging. Code Status: DNR Family Communication: Wife at bedside updated on plan of care and all questions answered Disposition Plan: Transfer to floor.  Consultants:   None  Procedures:   Ultrasound-guided thoracentesis planned for 7/26  Antimicrobials:  Anti-infectives (From admission, onward)   Start     Dose/Rate Route Frequency Ordered Stop   06/28/18 1400  cephALEXin (KEFLEX) capsule 250 mg    Note to Pharmacy:  Staph infection     250 mg Oral Every 12 hours 06/28/18 1353         Subjective: In bed, feels very weak, denies chest pain, has some shortness of breath but is not in extreme distress.  Objective: Vitals:   06/29/18 1129 06/29/18 1200 06/29/18 1327 06/29/18 1421  BP:   Marland Kitchen)  81/49 (!) 90/42  Pulse: 73 73 76   Resp: 20 17 18    Temp: 98.6 F (37 C)  97.7 F (36.5 C)   TempSrc: Oral  Oral   SpO2: 100% 100% 100%   Weight:      Height:        Intake/Output Summary (Last 24 hours) at 06/29/2018 1755 Last data filed at  06/29/2018 1217 Gross per 24 hour  Intake 290 ml  Output 2000 ml  Net -1710 ml   Filed Weights   06/28/18 0100 06/28/18 1555 06/29/18 0300  Weight: 80.9 kg (178 lb 5.6 oz) 80.8 kg (178 lb 2.1 oz) 80.9 kg (178 lb 5.6 oz)    Examination:  General exam: Alert, awake, oriented x 3 Respiratory system: Muffled breath sounds to the right base and midlung fields. Cardiovascular system:RRR. No murmurs, rubs, gallops. Gastrointestinal system: Abdomen is nondistended, soft and nontender. No organomegaly or masses felt. Normal bowel sounds heard. Central nervous system: Alert and oriented. No focal neurological deficits. Extremities: No C/C/E, +pedal pulses Skin: No rashes, lesions or ulcers Psychiatry: Judgement and insight appear normal. Mood & affect appropriate.      Data Reviewed: I have personally reviewed following labs and imaging studies  CBC: Recent Labs  Lab 06/27/18 1939 06/29/18 0418  WBC 7.0 5.6  NEUTROABS 6.2  --   HGB 11.3* 10.3*  HCT 34.8* 32.0*  MCV 111.5* 111.9*  PLT 81* 81*   Basic Metabolic Panel: Recent Labs  Lab 06/27/18 1939 06/28/18 0354 06/29/18 0418  NA 134* 136 135  K 4.0 4.1 3.8  CL 98 100 98  CO2 27 27 28   GLUCOSE 159* 125* 112*  BUN 41* 43* 28*  CREATININE 2.83* 2.99* 2.29*  CALCIUM 8.7* 8.8* 8.7*  MG 1.8  --   --    GFR: Estimated Creatinine Clearance: 34.1 mL/min (A) (by C-G formula based on SCr of 2.29 mg/dL (H)). Liver Function Tests: Recent Labs  Lab 06/27/18 1939 06/28/18 0354  AST 25 24  ALT 15 13  ALKPHOS 179* 162*  BILITOT 2.0* 2.1*  PROT 6.4* 6.3*  ALBUMIN 2.7* 2.6*   No results for input(s): LIPASE, AMYLASE in the last 168 hours. Recent Labs  Lab 06/27/18 1939  AMMONIA 23   Coagulation Profile: Recent Labs  Lab 06/27/18 1939 06/28/18 0354 06/28/18 1614 06/29/18 0805 06/29/18 1442  INR 2.85 2.76 2.44 1.73 1.60   Cardiac Enzymes: No results for input(s): CKTOTAL, CKMB, CKMBINDEX, TROPONINI in the last 168  hours. BNP (last 3 results) No results for input(s): PROBNP in the last 8760 hours. HbA1C: No results for input(s): HGBA1C in the last 72 hours. CBG: Recent Labs  Lab 06/28/18 2112 06/29/18 0502 06/29/18 0723 06/29/18 1128 06/29/18 1619  GLUCAP 82 105* 89 119* 132*   Lipid Profile: No results for input(s): CHOL, HDL, LDLCALC, TRIG, CHOLHDL, LDLDIRECT in the last 72 hours. Thyroid Function Tests: No results for input(s): TSH, T4TOTAL, FREET4, T3FREE, THYROIDAB in the last 72 hours. Anemia Panel: No results for input(s): VITAMINB12, FOLATE, FERRITIN, TIBC, IRON, RETICCTPCT in the last 72 hours. Urine analysis:    Component Value Date/Time   COLORURINE YELLOW 07/09/2017 2215   APPEARANCEUR HAZY (A) 07/09/2017 2215   LABSPEC 1.015 07/09/2017 2215   PHURINE 5.0 07/09/2017 2215   GLUCOSEU 100 (A) 07/09/2017 2215   HGBUR TRACE (A) 07/09/2017 2215   BILIRUBINUR NEGATIVE 07/09/2017 2215   KETONESUR NEGATIVE 07/09/2017 2215   PROTEINUR NEGATIVE 07/09/2017 2215   UROBILINOGEN 1.0 08/23/2015  7035   NITRITE NEGATIVE 07/09/2017 2215   LEUKOCYTESUR TRACE (A) 07/09/2017 2215   Sepsis Labs: @LABRCNTIP (procalcitonin:4,lacticidven:4)  ) Recent Results (from the past 240 hour(s))  MRSA PCR Screening     Status: None   Collection Time: 06/28/18 12:52 AM  Result Value Ref Range Status   MRSA by PCR NEGATIVE NEGATIVE Final    Comment:        The GeneXpert MRSA Assay (FDA approved for NASAL specimens only), is one component of a comprehensive MRSA colonization surveillance program. It is not intended to diagnose MRSA infection nor to guide or monitor treatment for MRSA infections. Performed at [facility], [address], [place], [place] [address]          Radiology Studies: Dg Chest [facility] 1 View  Result Date: 06/27/2018 CLINICAL DATA:  Shortness of breath. EXAM: PORTABLE CHEST 1 VIEW COMPARISON:  Radiograph of May 30, 2018. FINDINGS: Stable cardiomegaly. Left-sided  pacemaker is unchanged in position. Right internal jugular dialysis catheter is unchanged in position. Left lung is clear. Large right pleural effusion is noted which is increased compared to prior exam, with associated atelectasis or infiltrate. No pneumothorax is seen. Bony thorax is unremarkable. IMPRESSION: Large right pleural effusion is noted which is increased in size compared to prior exam, with probable underlying atelectasis or infiltrate. Electronically Signed   By: Marijo Conception, M.D.   On: 06/27/2018 19:18        Scheduled Meds: . atorvastatin  10 mg Oral Daily  . cephALEXin  250 mg Oral Q12H  . Chlorhexidine Gluconate Cloth  6 each Topical Q0600  . fluticasone  2 spray Each Nare Daily  . insulin aspart  0-5 Units Subcutaneous QHS  . insulin aspart  0-9 Units Subcutaneous TID WC  . insulin aspart  3 Units Subcutaneous TID WC  . levothyroxine  75 mcg Oral QAC breakfast  . loratadine  10 mg Oral Daily  . mouth rinse  15 mL Mouth Rinse BID  . midodrine  15 mg Oral BID WC  . pantoprazole  40 mg Oral Daily  . sodium chloride flush  3 mL Intravenous Q12H  . sucroferric oxyhydroxide  500 mg Oral TID WC   Continuous Infusions: . sodium chloride    . sodium chloride    . sodium chloride    . [START ON 06/30/2018] albumin human    . phytonadione (VITAMIN K) IV       LOS: 2 days    Time spent: 25 minutes.     Lelon Frohlich, MD [facility] Pager [phone]  If 7PM-7AM, please contact night-coverage [email or web address] Password [facility] 06/29/2018, 5:55 PM

## 2018-06-30 ENCOUNTER — Inpatient Hospital Stay (HOSPITAL_COMMUNITY): Payer: Medicare Other

## 2018-06-30 ENCOUNTER — Encounter (HOSPITAL_COMMUNITY): Payer: Self-pay

## 2018-06-30 LAB — GLUCOSE, CAPILLARY
GLUCOSE-CAPILLARY: 97 mg/dL (ref 70–99)
Glucose-Capillary: 113 mg/dL — ABNORMAL HIGH (ref 70–99)
Glucose-Capillary: 117 mg/dL — ABNORMAL HIGH (ref 70–99)
Glucose-Capillary: 119 mg/dL — ABNORMAL HIGH (ref 70–99)

## 2018-06-30 LAB — CBC
HCT: 33 % — ABNORMAL LOW (ref 39.0–52.0)
HEMOGLOBIN: 10.4 g/dL — AB (ref 13.0–17.0)
MCH: 35 pg — ABNORMAL HIGH (ref 26.0–34.0)
MCHC: 31.5 g/dL (ref 30.0–36.0)
MCV: 111.1 fL — ABNORMAL HIGH (ref 78.0–100.0)
Platelets: 74 10*3/uL — ABNORMAL LOW (ref 150–400)
RBC: 2.97 MIL/uL — AB (ref 4.22–5.81)
RDW: 15.2 % (ref 11.5–15.5)
WBC: 5.4 10*3/uL (ref 4.0–10.5)

## 2018-06-30 LAB — RENAL FUNCTION PANEL
ALBUMIN: 2.6 g/dL — AB (ref 3.5–5.0)
ANION GAP: 11 (ref 5–15)
BUN: 37 mg/dL — ABNORMAL HIGH (ref 8–23)
CALCIUM: 8.7 mg/dL — AB (ref 8.9–10.3)
CO2: 26 mmol/L (ref 22–32)
Chloride: 96 mmol/L — ABNORMAL LOW (ref 98–111)
Creatinine, Ser: 3.04 mg/dL — ABNORMAL HIGH (ref 0.61–1.24)
GFR, EST AFRICAN AMERICAN: 24 mL/min — AB (ref >60)
GFR, EST NON AFRICAN AMERICAN: 20 mL/min — AB (ref >60)
GLUCOSE: 95 mg/dL (ref 70–99)
PHOSPHORUS: 3.2 mg/dL (ref 2.5–4.6)
Potassium: 4 mmol/L (ref 3.5–5.1)
SODIUM: 133 mmol/L — AB (ref 135–145)

## 2018-06-30 LAB — PROTIME-INR
INR: 1.47
Prothrombin Time: 17.7 seconds — ABNORMAL HIGH (ref 11.4–15.2)

## 2018-06-30 MED ORDER — ALBUMIN HUMAN 25 % IV SOLN
INTRAVENOUS | Status: AC
  Administered 2018-06-30: 25 g via INTRAVENOUS
  Filled 2018-06-30: qty 50

## 2018-06-30 MED ORDER — BENZONATATE 100 MG PO CAPS
100.0000 mg | ORAL_CAPSULE | Freq: Three times a day (TID) | ORAL | Status: DC | PRN
  Administered 2018-06-30: 100 mg via ORAL
  Filled 2018-06-30: qty 1

## 2018-06-30 MED ORDER — HEPARIN SODIUM (PORCINE) 1000 UNIT/ML IJ SOLN
INTRAMUSCULAR | Status: AC
  Administered 2018-06-30: 3400 [IU] via INTRAVENOUS_CENTRAL
  Filled 2018-06-30: qty 7

## 2018-06-30 NOTE — Progress Notes (Signed)
PROGRESS NOTE    [name]  GEX:[id number] DOB: July 07, 1956 DOA: 06/27/2018 PCP: Redmond School, MD     Brief Narrative:  62 year old man admitted from home on 7/23 due to shortness of breath. He has a history of ischemic cardiomyopathy with unknown ejection fraction of 20 to 25% last seen on echo in April 2019, he is status post ICD placement, also has end-stage renal disease and is maintained on hemodialysis on Monday, Wednesday, Friday, also history of atrial fibrillation maintained on Coumadin status post AV node ablation, type 2 diabetes, hypothyroidism, hyperlipidemia and recurrent thoracentesis secondary to pleural effusions.  INR on admission was 2.85 was given 1 mg of oral vitamin K on 7/23 without much improvement in his INR.   Assessment & Plan:   Principal Problem:   Recurrent pleural effusion on right Active Problems:   Ischemic cardiomyopathy-EF 35% 04/24/15 echo   Long term current use of anticoagulant therapy   DM type 2, uncontrolled, with renal complications (HCC)   BiV ICD ([employer]) gen change 04/10/15   Anemia of chronic disease   Thrombocytopenia (HCC)   Chronic systolic heart failure ([place])   ESRD on dialysis ([place])   Heart failure (HCC)   Pressure injury of skin   Recurrent right-sided pleural effusion -Suspect transudative due to cardiomyopathy with low ejection fraction. -INR this a.m. is 1.47, to proceed with thoracentesis later today. -Patient had been referred prior to  thoracic surgery for consideration of talc pleurodesis.  I believe that this is an appropriate referral given his multiple thoracentesis, have advised wife to reschedule appointment.  Acute on chronic systolic heart failure decompensation -Patient is not on any diuretic therapy at home due to chronic hypotension, it appears volume status is managed through hemodialysis.  End-stage renal disease -On hemodialysis Monday, Wednesday, Friday. -Nephrology has been consulted for dialysis  needs. -Has so far been dialyzed on schedule.  Atrial fibrillation, paroxysmal -status post AV node ablation with ICD -Continue to hold warfarin, will reverse with IV vitamin K in anticipation of thoracentesis in a.m.  Hypothyroidism -Continue Synthroid  Hyperlipidemia -Continue atorvastatin  GERD -Maintain on Protonix  Chronic thrombocytopenia  -stable, monitor with serial CBCs.  Sacral decubitus ulcer of the sacrum -Stage III, continue wound care recommendations. -Present prior to admission   DVT prophylaxis: Warfarin currently on hold. Resume after thoracentesis without need for bridging. Code Status: DNR Family Communication: Wife at bedside updated on plan of care and all questions answered Disposition Plan: Anticipated DC home in 24 hours.  Consultants:   None  Procedures:   Ultrasound-guided thoracentesis planned for 7/26  Antimicrobials:  Anti-infectives (From admission, onward)   Start     Dose/Rate Route Frequency Ordered Stop   06/28/18 1400  cephALEXin (KEFLEX) capsule 250 mg    Note to Pharmacy:  Staph infection     250 mg Oral Every 12 hours 06/28/18 1353         Subjective: Sitting up in bed, eating breakfast. Some mild SOB today.  Objective: Vitals:   06/30/18 1352 06/30/18 1540 06/30/18 1545 06/30/18 1615  BP: (!) 82/59 (!) 85/56 92/60 (!) 97/59  Pulse: 76 74 74 76  Resp: 16 16    Temp:  97.9 F (36.6 C)    TempSrc:  Oral    SpO2: 100% 100%    Weight:  81 kg (178 lb 9.2 oz)    Height:        Intake/Output Summary (Last 24 hours) at 06/30/2018 1633 Last data filed  at 06/30/2018 0900 Gross per 24 hour  Intake 480 ml  Output 100 ml  Net 380 ml   Filed Weights   06/29/18 0300 06/30/18 0510 06/30/18 1540  Weight: 80.9 kg (178 lb 5.6 oz) 82.4 kg (181 lb 10.5 oz) 81 kg (178 lb 9.2 oz)    Examination:  General exam: Alert, awake, oriented x 3 Respiratory system: Decreased BS right base and right midlung fields. Cardiovascular  system:RRR. No murmurs, rubs, gallops. Gastrointestinal system: Abdomen is nondistended, soft and nontender. No organomegaly or masses felt. Normal bowel sounds heard. Central nervous system: Alert and oriented. No focal neurological deficits. Extremities: No C/C/E, +pedal pulses Skin: No rashes, lesions or ulcers Psychiatry: Judgement and insight appear normal. Mood & affect appropriate.       Data Reviewed: I have personally reviewed following labs and imaging studies  CBC: Recent Labs  Lab 06/27/18 1939 06/29/18 0418 06/30/18 0540  WBC 7.0 5.6 5.4  NEUTROABS 6.2  --   --   HGB 11.3* 10.3* 10.4*  HCT 34.8* 32.0* 33.0*  MCV 111.5* 111.9* 111.1*  PLT 81* 81* 74*   Basic Metabolic Panel: Recent Labs  Lab 06/27/18 1939 06/28/18 0354 06/29/18 0418 06/30/18 0540  NA 134* 136 135 133*  K 4.0 4.1 3.8 4.0  CL 98 100 98 96*  CO2 27 27 28 26   GLUCOSE 159* 125* 112* 95  BUN 41* 43* 28* 37*  CREATININE 2.83* 2.99* 2.29* 3.04*  CALCIUM 8.7* 8.8* 8.7* 8.7*  MG 1.8  --   --   --   PHOS  --   --   --  3.2   GFR: Estimated Creatinine Clearance: 25.7 mL/min (A) (by C-G formula based on SCr of 3.04 mg/dL (H)). Liver Function Tests: Recent Labs  Lab 06/27/18 1939 06/28/18 0354 06/30/18 0540  AST 25 24  --   ALT 15 13  --   ALKPHOS 179* 162*  --   BILITOT 2.0* 2.1*  --   PROT 6.4* 6.3*  --   ALBUMIN 2.7* 2.6* 2.6*   No results for input(s): LIPASE, AMYLASE in the last 168 hours. Recent Labs  Lab 06/27/18 1939  AMMONIA 23   Coagulation Profile: Recent Labs  Lab 06/28/18 0354 06/28/18 1614 06/29/18 0805 06/29/18 1442 06/30/18 0540  INR 2.76 2.44 1.73 1.60 1.47   Cardiac Enzymes: No results for input(s): CKTOTAL, CKMB, CKMBINDEX, TROPONINI in the last 168 hours. BNP (last 3 results) No results for input(s): PROBNP in the last 8760 hours. HbA1C: No results for input(s): HGBA1C in the last 72 hours. CBG: Recent Labs  Lab 06/29/18 1128 06/29/18 1619  06/29/18 2055 06/30/18 0730 06/30/18 1126  GLUCAP 119* 132* 112* 97 113*   Lipid Profile: No results for input(s): CHOL, HDL, LDLCALC, TRIG, CHOLHDL, LDLDIRECT in the last 72 hours. Thyroid Function Tests: No results for input(s): TSH, T4TOTAL, FREET4, T3FREE, THYROIDAB in the last 72 hours. Anemia Panel: No results for input(s): VITAMINB12, FOLATE, FERRITIN, TIBC, IRON, RETICCTPCT in the last 72 hours. Urine analysis:    Component Value Date/Time   COLORURINE YELLOW 07/09/2017 2215   APPEARANCEUR HAZY (A) 07/09/2017 2215   LABSPEC 1.015 07/09/2017 2215   PHURINE 5.0 07/09/2017 2215   GLUCOSEU 100 (A) 07/09/2017 2215   HGBUR TRACE (A) 07/09/2017 2215   BILIRUBINUR NEGATIVE 07/09/2017 2215   KETONESUR NEGATIVE 07/09/2017 2215   PROTEINUR NEGATIVE 07/09/2017 2215   UROBILINOGEN 1.0 08/23/2015 0322   NITRITE NEGATIVE 07/09/2017 2215   LEUKOCYTESUR TRACE (A) 07/09/2017  2215   Sepsis Labs: @LABRCNTIP (procalcitonin:4,lacticidven:4)  ) Recent Results (from the past 240 hour(s))  MRSA PCR Screening     Status: None   Collection Time: 06/28/18 12:52 AM  Result Value Ref Range Status   MRSA by PCR NEGATIVE NEGATIVE Final    Comment:        The GeneXpert MRSA Assay (FDA approved for NASAL specimens only), is one component of a comprehensive MRSA colonization surveillance program. It is not intended to diagnose MRSA infection nor to guide or monitor treatment for MRSA infections. Performed at [facility], [address] [place], [place] [address]          Radiology Studies: Dg Chest 1 View  Result Date: 06/30/2018 CLINICAL DATA:  Right pleural effusion. Status post right thoracentesis. EXAM: CHEST  1 VIEW COMPARISON:  06/27/2018 FINDINGS: There has been partial re-expansion of the right lung after thoracentesis with slight decrease in the right effusion. Lucency at the right lung apex could represent a small right apical pneumothorax or segment of reinflation of right  upper lobe. Minimal atelectasis at the left lung base. Heart size and pulmonary vascularity are normal. AICD in place. Double-lumen dialysis catheter in place with the tips in the right atrium, unchanged. No acute bone abnormality. IMPRESSION: 1. Partial re-expansion of the right long after right thoracentesis. A fairly large effusion remains. 2. Lucency at the right lung apex could represent a minimal right apical pneumothorax or re-expansion of a segment of the right upper lobe. Electronically Signed   By: Lorriane Shire M.D.   On: 06/30/2018 14:36   US Thoracentesis Asp Pleural Space W/img Guide  Result Date: 06/30/2018 INDICATION: Right pleural effusion. EXAM: ULTRASOUND GUIDED RIGHT THORACENTESIS MEDICATIONS: None. COMPLICATIONS: None immediate. PROCEDURE: An ultrasound guided thoracentesis was thoroughly discussed with the patient and questions answered. The benefits, risks, alternatives and complications were also discussed. The patient understands and wishes to proceed with the procedure. Written consent was obtained. Ultrasound was performed to localize and mark an adequate pocket of fluid in the right side of the chest. The area was then prepped and draped in the normal sterile fashion. 1% Lidocaine was used for local anesthesia. Under ultrasound guidance a thoracentesis catheter was introduced. Thoracentesis was performed. The catheter was removed and a dressing applied. FINDINGS: A total of approximately 1400 cc of amber colored fluid was removed. Samples were sent to the laboratory as requested by the clinical team. IMPRESSION: Successful ultrasound guided right thoracentesis yielding 1400 cc of pleural fluid. Electronically Signed   By: Lorriane Shire M.D.   On: 06/30/2018 14:14        Scheduled Meds: . atorvastatin  10 mg Oral Daily  . cephALEXin  250 mg Oral Q12H  . Chlorhexidine Gluconate Cloth  6 each Topical Q0600  . fluticasone  2 spray Each Nare Daily  . heparin      . insulin  aspart  0-5 Units Subcutaneous QHS  . insulin aspart  0-9 Units Subcutaneous TID WC  . insulin aspart  3 Units Subcutaneous TID WC  . levothyroxine  75 mcg Oral QAC breakfast  . loratadine  10 mg Oral Daily  . mouth rinse  15 mL Mouth Rinse BID  . midodrine  15 mg Oral BID WC  . pantoprazole  40 mg Oral Daily  . sodium chloride flush  3 mL Intravenous Q12H  . sucroferric oxyhydroxide  500 mg Oral TID WC   Continuous Infusions: . sodium chloride    . sodium chloride    .  sodium chloride    . albumin human    . albumin human       LOS: 3 days    Time spent: 25 minutes.     Lelon Frohlich, MD [facility] Pager [phone]  If 7PM-7AM, please contact night-coverage [email or web address] Password [facility] 06/30/2018, 4:33 PM

## 2018-06-30 NOTE — Progress Notes (Signed)
Subjective: Interval History: Patient states that he had episode of difficulty breathing last night but felt better this morning.  He denies any chest pain and no orthopnea.  Denies any nausea or vomiting.  Objective: Vital signs in last 24 hours: Temp:  [97.6 F (36.4 C)-98.6 F (37 C)] 97.8 F (36.6 C) (07/26 0510) Pulse Rate:  [70-76] 75 (07/26 0510) Resp:  [15-25] 18 (07/26 0510) BP: (81-108)/(42-70) 102/61 (07/26 0510) SpO2:  [92 %-100 %] 96 % (07/26 0510) Weight:  [82.4 kg (181 lb 10.5 oz)] 82.4 kg (181 lb 10.5 oz) (07/26 0510) Weight change: 1.6 kg (3 lb 8.4 oz)  Intake/Output from previous day: 07/25 0701 - 07/26 0700 In: 290 [P.O.:240; IV Piggyback:50] Out: 100 [Stool:100] Intake/Output this shift: No intake/output data recorded.  Generally: Patient is alert and sitting. Chest: Decreased breath sound bilaterally right greater than left.  He has also some inspiratory crackles at the bases. Heart exam revealed regular rate and rhythm no murmur Extremities no edema  Lab Results: Recent Labs    06/29/18 0418 06/30/18 0540  WBC 5.6 5.4  HGB 10.3* 10.4*  HCT 32.0* 33.0*  PLT 81* 74*   BMET:  Recent Labs    06/29/18 0418 06/30/18 0540  NA 135 133*  K 3.8 4.0  CL 98 96*  CO2 28 26  GLUCOSE 112* 95  BUN 28* 37*  CREATININE 2.29* 3.04*  CALCIUM 8.7* 8.7*   No results for input(s): PTH in the last 72 hours. Iron Studies: No results for input(s): IRON, TIBC, TRANSFERRIN, FERRITIN in the last 72 hours.  Studies/Results: No results found.  I have reviewed the patient's current medications.  Assessment/Plan: 1] difficulty breathing: Thought to be secondary to pleural effusion and fluid overload.  He has episode of difficulty breathing last night but feeling better.  Patient was dialyzed the day before yesterday with 2 L fluid removal. 2] recurrent pleural effusion.  Presently patient with significant right pleural effusion.  His thoracentesis was not done  yesterday because of high INR.  Patient is for possible thoracentesis today.  3] hypotension: This is a recurrent problem.  Mostly intra-dialytic  4] anemia: His hemoglobin is within our target goal 5] diabetes 6] bone and mineral disorder: His calcium  and phosphorus is range. 7] history of ischemic cardiomyopathy with very low ejection fraction. 8] atrial fibrillation: His heart rate is controlled.  Patient also with pacemaker. Plan: 1] we will dialyze patient today 2] we will use albumin 25 grams 1 dose during dialysis 3] we will try to remove 2 L if systolic blood pressure remains above 90    LOS: 3 days   Iceis Knab S 06/30/2018,9:01 AM

## 2018-06-30 NOTE — Care Management Note (Signed)
Case Management Note  Patient Details  Name: ALANSON HAUSMANN MRN: [id number] Date of Birth: 1956-05-01  Expected Discharge Date:    07/01/2018              Expected Discharge Plan:  [place]  In-House Referral:     Discharge planning Services  CM Consult  Post Acute Care Choice:  Home Health, Resumption of Svcs/PTA Provider  HH Arranged:   RN, PT [facility] Agency:  [place]  Status of Service:  Completed, signed off  If discussed at [place] of Stay Meetings, dates discussed:    Additional Comments: Potential DC home in next 24 hrs. Pt to undergo thoracentesis and HD today. Pt's wife at bedside, she is aware HH has 48 hrs to make first visit. Vaughan Basta, [facility] rep, aware of DC plan.   Sherald Barge, RN 06/30/2018, 11:23 AM

## 2018-06-30 NOTE — Procedures (Signed)
HEMODIALYSIS TREATMENT NOTE:   4 hour heparin-free dialysis completed via right IJ tunneled catheter. Exit site unremarkable. Goal NOT met: Unable to tolerate removal of 2.5L as ordered, despite administration of Albumin 25g.  Ultrafiltration was suspended for 2 hours due to SBP<90 (asymptomatic). Net UF 1005cc.  All blood was returned.     Rockwell Alexandria, RN, CDN

## 2018-06-30 NOTE — Progress Notes (Signed)
Thoracentesis complete no signs of distress.  

## 2018-06-30 NOTE — Care Management Important Message (Signed)
Important Message  Patient Details  Name: TYLAR AMBORN MRN: [id number] Date of Birth: 12-21-1955   Medicare Important Message Given:  Yes    Sherald Barge, RN 06/30/2018, 11:21 AM

## 2018-06-30 NOTE — Progress Notes (Signed)
Rt called to check patient because he reports difficulty breathing. Upon arrival to room patient is resting comfortably and states that his shortness of breath has subsided since receiving his Xanax and having his O2 turned up. Patient now at 2.5 lpm satting 98%. Breath sounds are diminished on the right with fine crackles throughout both lungs. Patient is schedule for thoracentesis tomorrow. RT told patient to call if he gets SOB again.

## 2018-07-01 LAB — GLUCOSE, CAPILLARY
GLUCOSE-CAPILLARY: 109 mg/dL — AB (ref 70–99)
Glucose-Capillary: 118 mg/dL — ABNORMAL HIGH (ref 70–99)

## 2018-07-01 NOTE — Progress Notes (Signed)
Pt discharged home today per Dr. Jerilee Hoh. Pt's IV site D/C'd and WDL. Pt's VSS. Pt provided with home medication list and discharge instructions. Verbalized understanding. Pt left floor via WC in stable condition accompanied by RN and NT.

## 2018-07-01 NOTE — Discharge Instructions (Signed)
Resume your coumadin dose.

## 2018-07-01 NOTE — Progress Notes (Signed)
Subjective: Interval History: Patient is feeling much better.  No difficulty breathing or cough.  Patient also denies any chest pain.  Objective: Vital signs in last 24 hours: Temp:  [97.7 F (36.5 C)-97.9 F (36.6 C)] 97.9 F (36.6 C) (07/27 0555) Pulse Rate:  [71-81] 75 (07/27 0555) Resp:  [16-20] 18 (07/27 0555) BP: (75-106)/(42-67) 95/61 (07/27 0555) SpO2:  [97 %-100 %] 99 % (07/27 0555) Weight:  [81 kg (178 lb 9.2 oz)-81.1 kg (178 lb 14.4 oz)] 81.1 kg (178 lb 14.4 oz) (07/27 0433) Weight change: -1.4 kg (-3 lb 1.4 oz)  Intake/Output from previous day: 07/26 0701 - 07/27 0700 In: 290 [P.O.:240; IV Piggyback:50] Out: 1005  Intake/Output this shift: No intake/output data recorded.  Generally: Patient is alert and sitting. Chest: Decreased breath sound bilaterally right greater than left.  He has also some inspiratory crackles at the bases. Heart exam revealed regular rate and rhythm no murmur Extremities no edema  Lab Results: Recent Labs    06/29/18 0418 06/30/18 0540  WBC 5.6 5.4  HGB 10.3* 10.4*  HCT 32.0* 33.0*  PLT 81* 74*   BMET:  Recent Labs    06/29/18 0418 06/30/18 0540  NA 135 133*  K 3.8 4.0  CL 98 96*  CO2 28 26  GLUCOSE 112* 95  BUN 28* 37*  CREATININE 2.29* 3.04*  CALCIUM 8.7* 8.7*   No results for input(s): PTH in the last 72 hours. Iron Studies: No results for input(s): IRON, TIBC, TRANSFERRIN, FERRITIN in the last 72 hours.  Studies/Results: Dg Chest 1 View  Result Date: 06/30/2018 CLINICAL DATA:  Right pleural effusion. Status post right thoracentesis. EXAM: CHEST  1 VIEW COMPARISON:  06/27/2018 FINDINGS: There has been partial re-expansion of the right lung after thoracentesis with slight decrease in the right effusion. Lucency at the right lung apex could represent a small right apical pneumothorax or segment of reinflation of right upper lobe. Minimal atelectasis at the left lung base. Heart size and pulmonary vascularity are normal.  AICD in place. Double-lumen dialysis catheter in place with the tips in the right atrium, unchanged. No acute bone abnormality. IMPRESSION: 1. Partial re-expansion of the right long after right thoracentesis. A fairly large effusion remains. 2. Lucency at the right lung apex could represent a minimal right apical pneumothorax or re-expansion of a segment of the right upper lobe. Electronically Signed   By: Lorriane Shire M.D.   On: 06/30/2018 14:36   US Thoracentesis Asp Pleural Space W/img Guide  Result Date: 06/30/2018 INDICATION: Right pleural effusion. EXAM: ULTRASOUND GUIDED RIGHT THORACENTESIS MEDICATIONS: None. COMPLICATIONS: None immediate. PROCEDURE: An ultrasound guided thoracentesis was thoroughly discussed with the patient and questions answered. The benefits, risks, alternatives and complications were also discussed. The patient understands and wishes to proceed with the procedure. Written consent was obtained. Ultrasound was performed to localize and mark an adequate pocket of fluid in the right side of the chest. The area was then prepped and draped in the normal sterile fashion. 1% Lidocaine was used for local anesthesia. Under ultrasound guidance a thoracentesis catheter was introduced. Thoracentesis was performed. The catheter was removed and a dressing applied. FINDINGS: A total of approximately 1400 cc of amber colored fluid was removed. Samples were sent to the laboratory as requested by the clinical team. IMPRESSION: Successful ultrasound guided right thoracentesis yielding 1400 cc of pleural fluid. Electronically Signed   By: Lorriane Shire M.D.   On: 06/30/2018 14:14    I have reviewed the patient's  current medications.  Assessment/Plan: 1] difficulty breathing: Thought to be secondary to pleural effusion and fluid overload.  He is status post hemodialysis yesterday with 1 L fluid removal.  Patient also had thoracentesis with 1400 cc of fluid removal.  Patient is still with  significant right pleural effusion.  Patient however is feeling much better. 2] recurrent pleural effusion.  As stated above thoracentesis was done yesterday.  Still patient has remaining significant pleural effusion. 3] hypotension: Mostly intradialytic.  Unable to remove more than a liter during dialysis yesterday because of his hypotension.  Even with the addition of albumin during dialysis. 4] anemia: His hemoglobin is within our target goal 5] diabetes: Patient denies any polydipsia.  His blood sugar is reasonably controlled. 6] bone and mineral disorder: His calcium  and phosphorus is range. 7] history of ischemic cardiomyopathy with very low ejection fraction. 8] atrial fibrillation: His heart rate is controlled.  Patient also with pacemaker. Plan: 1] we we will continue his present management 2] patient possibly may require pleurodesis because of persistent recurrent pleural effusion. 3] we will check his CBC and renal panel in the morning.    LOS: 4 days   Jearlean Demauro S 07/01/2018,8:44 AM

## 2018-07-01 NOTE — Discharge Summary (Addendum)
Physician Discharge Summary  [name] HKV:[id number] DOB: 06-Apr-1956 DOA: 06/27/2018  PCP: Redmond School, MD  Admit date: 06/27/2018 Discharge date: 07/01/2018  Time spent: 45 minutes  Recommendations for Outpatient Follow-up:  -Will be discharged home today. -Advised to follow up with his HD unit as scheduled on Monday. -Wife is making follow up appointment with thoracic surgery to consider pleurodesis given recurrent right-sided pleural effusions.   Discharge Diagnoses:  Principal Problem:   Recurrent pleural effusion on right Active Problems:   Ischemic cardiomyopathy-EF 35% 04/24/15 echo   Long term current use of anticoagulant therapy   DM type 2, uncontrolled, with hyperglycemia with renal complications (HCC)   BiV ICD ([employer]) gen change 04/10/15   Anemia of chronic disease   Thrombocytopenia (HCC)   Chronic systolic heart failure ([place])   ESRD on dialysis ([place])   Heart failure ([place])   Pressure injury of skin   Discharge Condition: Stable and improved  Filed Weights   06/30/18 0510 06/30/18 1540 07/01/18 0433  Weight: 82.4 kg (181 lb 10.5 oz) 81 kg (178 lb 9.2 oz) 81.1 kg (178 lb 14.4 oz)    History of present illness:  As per Dr. Manuella Ghazi on 7/23:  DELFORD WINGERT is a 62 y.o. male with medical history significant for ischemic cardiomyopathy with EF 20 to 25% last seen on echo 03/2018 status post ICD placement, end-stage renal disease on hemodialysis Monday, Wednesday, Friday, chronic systolic heart failure, atrial fibrillation status post AV node ablation, type 2 diabetes, chronic hypotension, hypothyroidism,dyslipidemia, and recurrent thoracentesis secondary to pleural effusions who presented to the emergency department with some shortness of breath as well as right-sided substernal chest pain that is only noticed with deep inspiration.  He actually did see his cardiologist Dr. Lovena Le earlier today at which point everything was noted to be okay and he was instructed  to follow-up again in 1 year.  EMS had noted systolic blood pressure of 80s while in route and he was given 200 cc of normal saline.  Patient recently had a thoracentesis on 6/25 with 2 prior last year in September and December. Patient denies any fever, chills, or chest pain currently.  No lower extremity edema noted.  He denies any palpitations, lightheadedness, or dizziness.   ED Course: Vital signs with paced rhythm noted and otherwise soft blood pressure readings.  Wife at bedside states that patient chronically has low blood pressure readings which are in the 56E systolic.  He has been placed on BiPAP due to some mild respiratory distress and is much more comfortable.  Chest x-ray demonstrates a large right-sided pleural effusion and laboratory data demonstrates what appears to be chronic anemia and thrombocytopenia as well as elevated creatinine consistent with his end-stage renal disease.  INR is currently 2.85.  EKG with paced rhythm.  BNP is noted to be 2606.    Hospital Course:   Recurrent right-sided pleural effusion -Suspect transudative due to cardiomyopathy with low ejection fraction. -Thoracentesis on 7/26 with removal of 1400 cc -Patient had been referred prior to  thoracic surgery for consideration of talc pleurodesis.  I believe that this is an appropriate referral given his multiple thoracentesis, have advised wife to reschedule appointment.  Acute on chronic systolic heart failure decompensation -Patient is not on any diuretic therapy at home due to chronic hypotension, it appears volume status is managed through hemodialysis.  End-stage renal disease -On hemodialysis Monday, Wednesday, Friday. -Nephrology has been consulted for dialysis needs. -Has so far  been dialyzed on schedule.  Atrial fibrillation, paroxysmal -status post AV node ablation with ICD -Warfarin has been on hold and reversed to allow for thoracentesis. -Ok to resume warfarin-dosing on DC. No need to  bridge.  Hypothyroidism -Continue Synthroid  Hyperlipidemia -Continue atorvastatin  GERD -Maintain on Protonix  Chronic thrombocytopenia  -stable, monitor with serial CBCs.  Sacral decubitus ulcer of the sacrum -Stage III, continue wound care recommendations. -Present prior to admission     Procedures:  Right thoracentesis on 7/26 with removal of 1400 cc of fluid.   Consultations:  Renal  Radiology  Discharge Instructions  Discharge Instructions    Diet - low sodium heart healthy   Complete by:  As directed    Increase activity slowly   Complete by:  As directed      Allergies as of 07/01/2018   No Known Allergies     Medication List    TAKE these medications   ALPRAZolam 0.5 MG tablet Commonly known as:  XANAX Take 0.5 mg by mouth 3 (three) times daily as needed.   aspirin EC 81 MG tablet Take 1 tablet (81 mg total) by mouth daily.   atorvastatin 10 MG tablet Commonly known as:  LIPITOR Take 1 tablet (10 mg total) by mouth daily.   cephALEXin 250 MG capsule Commonly known as:  KEFLEX Take 250 capsules by mouth 2 (two) times daily. Staph infection   fexofenadine 180 MG tablet Commonly known as:  ALLEGRA Take 180 mg by mouth daily.   fluticasone 50 MCG/ACT nasal spray Commonly known as:  FLONASE Place 2 sprays into both nostrils daily.   glucose blood test strip Use to test blood sugar 3 times daily   levothyroxine 75 MCG tablet Commonly known as:  SYNTHROID, LEVOTHROID Take 75 mcg by mouth daily before breakfast.   midodrine 5 MG tablet Commonly known as:  PROAMATINE TAKE 3 TABLETS (15 MG TOTAL) BY MOUTH 2 (TWO) TIMES DAILY WITH A MEAL.   nitroGLYCERIN 0.4 MG SL tablet Commonly known as:  NITROSTAT Place 1 tablet (0.4 mg total) under the tongue every 5 (five) minutes as needed for chest pain.   NOVOLOG FLEXPEN 100 UNIT/ML FlexPen Generic drug:  insulin aspart Inject 3 Units into the skin 3 (three) times daily as needed for  high blood sugar.   Oxycodone HCl 10 MG Tabs Take 10 mg by mouth every 4 (four) hours as needed. For pain   pantoprazole 40 MG tablet Commonly known as:  PROTONIX Take 1 tablet (40 mg total) by mouth daily.   PROAIR RESPICLICK 656 (90 Base) MCG/ACT Aepb Generic drug:  Albuterol Sulfate Inhale 1 puff into the lungs every 6 (six) hours as needed (for breathing).   sevelamer carbonate 800 MG tablet Commonly known as:  RENVELA Take 800-1,600 mg by mouth See admin instructions. 2 tablets with meals and 1 tablet with snacks   sucroferric oxyhydroxide 500 MG chewable tablet Commonly known as:  VELPHORO Chew 1 tablet (500 mg total) by mouth 3 (three) times daily with meals.   warfarin 1 MG tablet Commonly known as:  COUMADIN Take as directed. If you are unsure how to take this medication, talk to your nurse or doctor. Original instructions:  TAKE 1 TABLET BY MOUTH EVERY DAY      No Known Allergies Follow-up Information    Redmond School, MD. Schedule an appointment as soon as possible for a visit in 2 week(s).   Specialty:  Internal Medicine Contact information: [place]  [place] [address] [phone]            The results of significant diagnostics from this hospitalization (including imaging, microbiology, ancillary and laboratory) are listed below for reference.    Significant Diagnostic Studies: Dg Chest 1 View  Result Date: 06/30/2018 CLINICAL DATA:  Right pleural effusion. Status post right thoracentesis. EXAM: CHEST  1 VIEW COMPARISON:  06/27/2018 FINDINGS: There has been partial re-expansion of the right lung after thoracentesis with slight decrease in the right effusion. Lucency at the right lung apex could represent a small right apical pneumothorax or segment of reinflation of right upper lobe. Minimal atelectasis at the left lung base. Heart size and pulmonary vascularity are normal. AICD in place. Double-lumen dialysis catheter in place with the  tips in the right atrium, unchanged. No acute bone abnormality. IMPRESSION: 1. Partial re-expansion of the right long after right thoracentesis. A fairly large effusion remains. 2. Lucency at the right lung apex could represent a minimal right apical pneumothorax or re-expansion of a segment of the right upper lobe. Electronically Signed   By: Lorriane Shire M.D.   On: 06/30/2018 14:36   Dg Chest Port 1 View  Result Date: 06/27/2018 CLINICAL DATA:  Shortness of breath. EXAM: PORTABLE CHEST 1 VIEW COMPARISON:  Radiograph of May 30, 2018. FINDINGS: Stable cardiomegaly. Left-sided pacemaker is unchanged in position. Right internal jugular dialysis catheter is unchanged in position. Left lung is clear. Large right pleural effusion is noted which is increased compared to prior exam, with associated atelectasis or infiltrate. No pneumothorax is seen. Bony thorax is unremarkable. IMPRESSION: Large right pleural effusion is noted which is increased in size compared to prior exam, with probable underlying atelectasis or infiltrate. Electronically Signed   By: Marijo Conception, M.D.   On: 06/27/2018 19:18   US Thoracentesis Asp Pleural Space W/img Guide  Result Date: 06/30/2018 INDICATION: Right pleural effusion. EXAM: ULTRASOUND GUIDED RIGHT THORACENTESIS MEDICATIONS: None. COMPLICATIONS: None immediate. PROCEDURE: An ultrasound guided thoracentesis was thoroughly discussed with the patient and questions answered. The benefits, risks, alternatives and complications were also discussed. The patient understands and wishes to proceed with the procedure. Written consent was obtained. Ultrasound was performed to localize and mark an adequate pocket of fluid in the right side of the chest. The area was then prepped and draped in the normal sterile fashion. 1% Lidocaine was used for local anesthesia. Under ultrasound guidance a thoracentesis catheter was introduced. Thoracentesis was performed. The catheter was removed and  a dressing applied. FINDINGS: A total of approximately 1400 cc of amber colored fluid was removed. Samples were sent to the laboratory as requested by the clinical team. IMPRESSION: Successful ultrasound guided right thoracentesis yielding 1400 cc of pleural fluid. Electronically Signed   By: Lorriane Shire M.D.   On: 06/30/2018 14:14    Microbiology: Recent Results (from the past 240 hour(s))  MRSA PCR Screening     Status: None   Collection Time: 06/28/18 12:52 AM  Result Value Ref Range Status   MRSA by PCR NEGATIVE NEGATIVE Final    Comment:        The GeneXpert MRSA Assay (FDA approved for NASAL specimens only), is one component of a comprehensive MRSA colonization surveillance program. It is not intended to diagnose MRSA infection nor to guide or monitor treatment for MRSA infections. Performed at [facility], [address] [place], [place] [address]      Labs: Basic Metabolic Panel: Recent Labs  Lab 06/27/18 1939 06/28/18 0354 06/29/18  0418 06/30/18 0540  NA 134* 136 135 133*  K 4.0 4.1 3.8 4.0  CL 98 100 98 96*  CO2 27 27 28 26   GLUCOSE 159* 125* 112* 95  BUN 41* 43* 28* 37*  CREATININE 2.83* 2.99* 2.29* 3.04*  CALCIUM 8.7* 8.8* 8.7* 8.7*  MG 1.8  --   --   --   PHOS  --   --   --  3.2   Liver Function Tests: Recent Labs  Lab 06/27/18 1939 06/28/18 0354 06/30/18 0540  AST 25 24  --   ALT 15 13  --   ALKPHOS 179* 162*  --   BILITOT 2.0* 2.1*  --   PROT 6.4* 6.3*  --   ALBUMIN 2.7* 2.6* 2.6*   No results for input(s): LIPASE, AMYLASE in the last 168 hours. Recent Labs  Lab 06/27/18 1939  AMMONIA 23   CBC: Recent Labs  Lab 06/27/18 1939 06/29/18 0418 06/30/18 0540  WBC 7.0 5.6 5.4  NEUTROABS 6.2  --   --   HGB 11.3* 10.3* 10.4*  HCT 34.8* 32.0* 33.0*  MCV 111.5* 111.9* 111.1*  PLT 81* 81* 74*   Cardiac Enzymes: No results for input(s): CKTOTAL, CKMB, CKMBINDEX, TROPONINI in the last 168 hours. BNP: BNP (last 3 results) Recent Labs      07/09/17 2151 06/27/18 1939  BNP 1,553.9* 2,606.0*    ProBNP (last 3 results) No results for input(s): PROBNP in the last 8760 hours.  CBG: Recent Labs  Lab 06/30/18 0730 06/30/18 1126 06/30/18 1646 06/30/18 2139 07/01/18 0755  GLUCAP 97 113* 117* 119* 109*       Signed:  Lelon Frohlich  [facility] Pager: [phone] 07/01/2018, 9:59 AM

## 2018-07-02 DIAGNOSIS — I2510 Atherosclerotic heart disease of native coronary artery without angina pectoris: Secondary | ICD-10-CM | POA: Diagnosis not present

## 2018-07-02 DIAGNOSIS — E1122 Type 2 diabetes mellitus with diabetic chronic kidney disease: Secondary | ICD-10-CM | POA: Diagnosis not present

## 2018-07-02 DIAGNOSIS — L89153 Pressure ulcer of sacral region, stage 3: Secondary | ICD-10-CM | POA: Diagnosis not present

## 2018-07-02 DIAGNOSIS — I255 Ischemic cardiomyopathy: Secondary | ICD-10-CM | POA: Diagnosis not present

## 2018-07-02 DIAGNOSIS — N185 Chronic kidney disease, stage 5: Secondary | ICD-10-CM | POA: Diagnosis not present

## 2018-07-02 DIAGNOSIS — I120 Hypertensive chronic kidney disease with stage 5 chronic kidney disease or end stage renal disease: Secondary | ICD-10-CM | POA: Diagnosis not present

## 2018-07-03 ENCOUNTER — Telehealth: Payer: Self-pay | Admitting: *Deleted

## 2018-07-03 DIAGNOSIS — D509 Iron deficiency anemia, unspecified: Secondary | ICD-10-CM | POA: Diagnosis not present

## 2018-07-03 DIAGNOSIS — D631 Anemia in chronic kidney disease: Secondary | ICD-10-CM | POA: Diagnosis not present

## 2018-07-03 DIAGNOSIS — Z992 Dependence on renal dialysis: Secondary | ICD-10-CM | POA: Diagnosis not present

## 2018-07-03 DIAGNOSIS — N2581 Secondary hyperparathyroidism of renal origin: Secondary | ICD-10-CM | POA: Diagnosis not present

## 2018-07-03 DIAGNOSIS — N186 End stage renal disease: Secondary | ICD-10-CM | POA: Diagnosis not present

## 2018-07-03 NOTE — Telephone Encounter (Signed)
Order given to [name] [facility] to check INR on Thursday 07/06/18.  INR was 1.4 on 7/26 and just resumed when he was d/c home.

## 2018-07-03 NOTE — Telephone Encounter (Signed)
Patient was just d/c'd from hospital. [name] is asking when to check INR. Please advise. / tg

## 2018-07-04 ENCOUNTER — Other Ambulatory Visit: Payer: Self-pay | Admitting: Cardiothoracic Surgery

## 2018-07-04 ENCOUNTER — Ambulatory Visit
Admission: RE | Admit: 2018-07-04 | Discharge: 2018-07-04 | Disposition: A | Discharge status: Home / Self Care | Payer: Medicare Other | Source: Ambulatory Visit | Attending: Cardiothoracic Surgery | Admitting: Cardiothoracic Surgery

## 2018-07-04 ENCOUNTER — Other Ambulatory Visit: Payer: Self-pay

## 2018-07-04 ENCOUNTER — Encounter: Payer: Self-pay | Admitting: Cardiothoracic Surgery

## 2018-07-04 ENCOUNTER — Other Ambulatory Visit: Payer: Self-pay | Admitting: *Deleted

## 2018-07-04 ENCOUNTER — Institutional Professional Consult (permissible substitution) (INDEPENDENT_AMBULATORY_CARE_PROVIDER_SITE_OTHER): Payer: Medicare Other | Admitting: Cardiothoracic Surgery

## 2018-07-04 VITALS — BP 84/50 | HR 73 | Resp 18 | Ht 70.0 in | Wt 175.0 lb

## 2018-07-04 DIAGNOSIS — I5032 Chronic diastolic (congestive) heart failure: Secondary | ICD-10-CM | POA: Diagnosis not present

## 2018-07-04 DIAGNOSIS — N185 Chronic kidney disease, stage 5: Secondary | ICD-10-CM

## 2018-07-04 DIAGNOSIS — J90 Pleural effusion, not elsewhere classified: Secondary | ICD-10-CM

## 2018-07-04 DIAGNOSIS — I2510 Atherosclerotic heart disease of native coronary artery without angina pectoris: Secondary | ICD-10-CM | POA: Diagnosis not present

## 2018-07-04 DIAGNOSIS — I120 Hypertensive chronic kidney disease with stage 5 chronic kidney disease or end stage renal disease: Secondary | ICD-10-CM | POA: Diagnosis not present

## 2018-07-04 DIAGNOSIS — L89153 Pressure ulcer of sacral region, stage 3: Secondary | ICD-10-CM | POA: Diagnosis not present

## 2018-07-04 DIAGNOSIS — I255 Ischemic cardiomyopathy: Secondary | ICD-10-CM

## 2018-07-04 DIAGNOSIS — I482 Chronic atrial fibrillation, unspecified: Secondary | ICD-10-CM

## 2018-07-04 DIAGNOSIS — E1122 Type 2 diabetes mellitus with diabetic chronic kidney disease: Secondary | ICD-10-CM | POA: Diagnosis not present

## 2018-07-04 NOTE — Progress Notes (Signed)
PCP is Redmond School, MD Referring Provider is Redmond School, MD  Chief Complaint  Patient presents with  . Pleural Effusion    new patient consultation, with chest xray  Patient examined, most recent images of chest x-ray, CT scan of chest, and echocardiogram personally reviewed and counseled with patient.  HPI: 62 year old debilitated patient presents for therapy of recurrent right pleural effusion secondary to chronic diastolic heart failure, EF 10 to 15%, pulmonary hypertension, atrial fibrillation, hepatic cirrhosis, and chronic renal failure on dialysis.  Patient had 1.4 L of fluid drawn from the right pleural space with thoracentesis in June.  He had a second right thoracentesis 4 days ago with which drew 1.4 L.  Cytology on pleural fluid was negative for  Malignancy. He uses home oxygen usually at night.  Today's chest x-ray shows some reaccumulation of the right pleural effusion.  He presents in wheelchair too weak to walk.  Last CT scan of the abdomen showed presence of ascites which is also present on exam today.  Patient is on chronic Coumadin therapy for his A. fib-1 mg daily. The patient is leaving on a will planned family beach trip in 4 days and will be away for a week.  He [place] dialysis [place].  He will not various plans and stay for placement of a right Pleurx catheter.  He lives in [place] and cannot have Pleurx catheter this week because of his dialysis schedule and the operating schedule at [facility].  Because he is reaccumulating fluid in about ready to leave town I have recommended that he have a right thoracentesis the day before he leaves and we will schedule the Pleurx catheter the first nondialysis day he returns.  He is not significantly short of breath today.  I explained to the patient and his family that he is too weak and cardiac function too poor to tolerate general anesthesia and a right VATS for talc pleurodesis.  Pleurx catheter drainage for  relief of symptoms and comfort is his best option.  I explained to the family that his prognosis with multiorgan failure is poor and patients that have heart failure, renal failure, and hepatic failure do not have a long life expectancy.     Past Medical History:  Diagnosis Date  . Adenocarcinoma of rectum ([place])    a. 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 AICD (automatic cardioverter/defibrillator) present 2002   BI V ICD  . Anemia   . CAD (coronary artery disease)    a. BMS to LAD 2001 at [facility] b. PTCA/atherectomy ramus and BMS to LAD 2009  . CHF (congestive heart failure) ([place])   . Cholelithiasis 06/2015  . Chronic kidney disease, stage IV (severe) ([place])   . Chronic systolic heart failure ([place])   . Cirrhosis ([place])   . Colostomy in place [facility])   . Dizziness    a. chronic. Admission for this 07/18/2014  . DM type 2, uncontrolled, with renal complications ([place])   . Dysrhythmia   . Essential hypertension, benign   . GERD (gastroesophageal reflux disease)   . HCAP (healthcare-associated pneumonia) 07/21/2015  . Hematuria   . History of blood transfusion    "I've had 2 units so far this year" (09/27/2015)  . HLD (hyperlipidemia)   . Ischemic cardiomyopathy    EF 18% by nuclear study 2016, multiple myocardial infarctions in past    . Myocardial infarction ([place]) 2001  . Obesity   . Orthostatic hypotension   . OSA on CPAP   .  Paroxysmal atrial fibrillation ([facility])    a. on amiodarone, digoxin and Eliquis  . PONV (postoperative nausea and vomiting)   . Presence of permanent cardiac pacemaker   . Prostate cancer ([place])    a. s/p seed implants with chemo and radiation  . SAH (subarachnoid hemorrhage) ([place])    post-traumatic (fall) [facility] 12/2014  . TIA (transient ischemic attack)     Past Surgical History:  Procedure Laterality Date  . A/V FISTULAGRAM Right 05/04/2018   Procedure: A/V FISTULAGRAM;  Surgeon: Conrad Hull, MD;  Location: [place] CV LAB;  Service: Cardiovascular;  Laterality: Right;  .  Abdominal and Perineal Resection of Rectum with Total Mesorectal Excision  10/04/2007  . AV FISTULA PLACEMENT Right 09/16/2015   Procedure: ARTERIOVENOUS (AV) FISTULA CREATION - BRACHIOCEPHALIC;  Surgeon: Elam Dutch, MD;  Location: [place];  Service: Vascular;  Laterality: Right;  . BI-VENTRICULAR IMPLANTABLE CARDIOVERTER DEFIBRILLATOR  (CRT-D)  2009  . CARDIAC CATHETERIZATION  08/2001; 2009   ; [name] 07/10/2013  . CARDIAC DEFIBRILLATOR PLACEMENT  2002  . CARDIAC DEFIBRILLATOR PLACEMENT  2009   Upgraded to a BiV ICD  . COLONOSCOPY  09/14/2011   Dr. Gala Romney: via colostomy, Single pedunculated benign inflammatory polyp. Due for surveillance Oct 2015  . COLONOSCOPY N/A 07/02/2014   Procedure: COLONOSCOPY;  Surgeon: Daneil Dolin, MD;  [facility];  Service: Endoscopy;  Laterality: N/A;  7:30 / COLONOSCOPY THRU COLOSTOMY  . COLONOSCOPY N/A 12/11/2014   Dr. Gala Romney via colostomy. Normal. Repeat in 2021.   Marland Kitchen COLONOSCOPY N/A 08/24/2015   Dr. Havery Moros: diminutive polyp not removed, stoma site of bleeding  . COLOSTOMY  2008  . CORONARY ANGIOPLASTY WITH STENT PLACEMENT  2001; ~ 2006   "1 + 1"   . ELECTROPHYSIOLOGIC STUDY N/A 08/28/2015   Procedure: AV Node Ablation;  Surgeon: Will Meredith Leeds, MD;  Location: [place] CV LAB;  Service: Cardiovascular;  Laterality: N/A;  . EP IMPLANTABLE DEVICE N/A 04/10/2015   Procedure: Ppm/Biv Ppm Generator Changeout;  Surgeon: Evans Lance, MD;  Location: [place] INVASIVE CV LAB CUPID;  Service: Cardiovascular;  Laterality: N/A;  . ERCP N/A 06/11/2016   Procedure: ENDOSCOPIC RETROGRADE CHOLANGIOPANCREATOGRAPHY (ERCP);  Surgeon: Teena Irani, MD;  Location: [facility] ENDOSCOPY;  Service: Endoscopy;  Laterality: N/A;  . ERCP N/A 01/26/2017   Procedure: ENDOSCOPIC RETROGRADE CHOLANGIOPANCREATOGRAPHY (ERCP);  Surgeon: Teena Irani, MD;  Location: [facility] ENDOSCOPY;  Service: Endoscopy;  Laterality: N/A;  . ERCP  06/2016   Dr. Amedeo Plenty: duodenal fistula, dilated bile duct but no  obvious stones, s/p sphincterotomy and stent placement  . ERCP  01/2017   Dr. Amedeo Plenty: CBD stones, s/p sphincterotomy, balloon extraction, stent removal  . ESOPHAGOGASTRODUODENOSCOPY N/A 07/02/2014   Procedure: ESOPHAGOGASTRODUODENOSCOPY (EGD);  Surgeon: Daneil Dolin, MD;  [facility];  Service: Endoscopy;  Laterality: N/A;  7:30  . ESOPHAGOGASTRODUODENOSCOPY N/A 12/11/2014   [id number] EGD  . ESOPHAGOGASTRODUODENOSCOPY (EGD) WITH PROPOFOL N/A 04/18/2017   Procedure: ESOPHAGOGASTRODUODENOSCOPY (EGD) WITH PROPOFOL;  Surgeon: Daneil Dolin, MD;  [facility];  Service: Endoscopy;  Laterality: N/A;  2:30pm  . GASTROINTESTINAL STENT REMOVAL N/A 01/26/2017   Procedure: GASTROINTESTINAL STENT REMOVAL;  Surgeon: Teena Irani, MD;  Location: [place];  Service: Endoscopy;  Laterality: N/A;  . GIVENS CAPSULE STUDY N/A 07/23/2015   Dr. Michail Sermon: small non-bleeding AVM, mild gastritis  . INSERTION OF DIALYSIS CATHETER Right 05/04/2018   Procedure: INSERTION OF DIALYSIS CATHETER;  Surgeon: Angelia Mould, MD;  Location: [facility]  OR;  Service: Vascular;  Laterality: Right;  . IR FLUORO GUIDE CV LINE RIGHT  07/27/2017  . IR REMOVAL TUN CV CATH W/O FL  08/12/2017  . IR THORACENTESIS ASP PLEURAL SPACE W/IMG GUIDE  11/07/2017  . IR THORACENTESIS ASP PLEURAL SPACE W/IMG GUIDE  05/30/2018  . IR US GUIDE VASC ACCESS RIGHT  07/27/2017  . LEFT HEART CATHETERIZATION WITH CORONARY ANGIOGRAM N/A 07/13/2013   Procedure: LEFT HEART CATHETERIZATION WITH CORONARY ANGIOGRAM;  Surgeon: Lorretta Harp, MD;  Location: [facility] CATH LAB;  Service: Cardiovascular;  Laterality: N/A;  . Venia Minks DILATION N/A 07/02/2014   Procedure: Venia Minks DILATION;  Surgeon: Daneil Dolin, MD;  [facility];  Service: Endoscopy;  Laterality: N/A;  7:30  . MALONEY DILATION N/A 04/18/2017   Procedure: Venia Minks DILATION;  Surgeon: Daneil Dolin, MD;  [facility];  Service: Endoscopy;  Laterality: N/A;  . PORTACATH  PLACEMENT  06/2007   "removed ~ 1 yr later"  . RIGHT HEART CATHETERIZATION N/A 02/24/2015   Procedure: RIGHT HEART CATH;  Surgeon: Jolaine Artist, MD;  Location: [facility] CATH LAB;  Service: Cardiovascular;  Laterality: N/A;  . SAVORY DILATION N/A 07/02/2014   Procedure: SAVORY DILATION;  Surgeon: Daneil Dolin, MD;  [facility];  Service: Endoscopy;  Laterality: N/A;  7:30    Family History  Problem Relation Age of Onset  . Colon cancer Mother 83  . Coronary artery disease Father   . Colon cancer Sister 48  . Diabetes Sister   . Colon cancer Other        2 cousins, succumbed to illness    Social History Social History   Tobacco Use  . Smoking status: Never Smoker  . Smokeless tobacco: Never Used  Substance Use Topics  . Alcohol use: No    Alcohol/week: 0.0 oz    Comment: Former user 45 years ago  . Drug use: No    Current Outpatient Medications  Medication Sig Dispense Refill  . ALPRAZolam (XANAX) 0.5 MG tablet Take 0.5 mg by mouth 3 (three) times daily as needed.  2  . aspirin EC 81 MG tablet Take 1 tablet (81 mg total) by mouth daily. 150 tablet 2  . cephALEXin (KEFLEX) 250 MG capsule Take 250 capsules by mouth 2 (two) times daily. Staph infection    . fexofenadine (ALLEGRA) 180 MG tablet Take 180 mg by mouth daily.    . fluticasone (FLONASE) 50 MCG/ACT nasal spray Place 2 sprays into both nostrils daily.  11  . glucose blood test strip Use to test blood sugar 3 times daily 100 each 4  . levothyroxine (SYNTHROID, LEVOTHROID) 75 MCG tablet Take 75 mcg by mouth daily before breakfast.    . midodrine (PROAMATINE) 5 MG tablet TAKE 3 TABLETS (15 MG TOTAL) BY MOUTH 2 (TWO) TIMES DAILY WITH A MEAL. 180 tablet 3  . nitroGLYCERIN (NITROSTAT) 0.4 MG SL tablet Place 1 tablet (0.4 mg total) under the tongue every 5 (five) minutes as needed for chest pain. 25 tablet 3  . NOVOLOG FLEXPEN 100 UNIT/ML FlexPen Inject 3 Units into the skin 3 (three) times daily as needed for high  blood sugar.   11  . Oxycodone HCl 10 MG TABS Take 10 mg by mouth every 4 (four) hours as needed. For pain  0  . pantoprazole (PROTONIX) 40 MG tablet Take 1 tablet (40 mg total) by mouth daily. 30 tablet 0  . PROAIR RESPICLICK 854 (90 BASE) MCG/ACT AEPB  Inhale 1 puff into the lungs every 6 (six) hours as needed (for breathing).   0  . sevelamer carbonate (RENVELA) 800 MG tablet Take 800-1,600 mg by mouth See admin instructions. 2 tablets with meals and 1 tablet with snacks    . sucroferric oxyhydroxide (VELPHORO) 500 MG chewable tablet Chew 1 tablet (500 mg total) by mouth 3 (three) times daily with meals. 90 tablet 0  . warfarin (COUMADIN) 1 MG tablet TAKE 1 TABLET BY MOUTH EVERY DAY 30 tablet 3  . atorvastatin (LIPITOR) 10 MG tablet Take 1 tablet (10 mg total) by mouth daily. 90 tablet 3   No current facility-administered medications for this visit.     No Known Allergies  Review of Systems   Nonfunctional AV fistula in the right upper arm Hemodialysis tunnel catheter dietetic in the right neck History of surgery with colostomy for rectal cancer with stage III disease followed by oncology  BP (!) 84/50 (BP Location: Left Arm, Patient Position: Sitting, Cuff Size: Small)   Pulse 73   Resp 18   Ht 5\' 10"  (1.778 m)   Wt 175 lb (79.4 kg)   SpO2 96% Comment: RA  BMI 25.11 kg/m  Physical Exam Chronically ill debilitated male sitting in wheelchair, clinically jaundiced Positive JVD Diminished breath sounds at right base Loud holosystolic murmur of tricuspid regurgitation with irregular heart rhythm Positive abdominal exam for ascites Positive tibial edema  Diagnostic Tests: Chest x-ray performed today shows some reaccumulation of right pleural effusion.  He probably has chronic atelectasis of the right lower lobe.  Impression: Recurrent right pleural effusion from combination of cirrhosis, renal failure, and chronic mixed systolic and diastolic heart failure  Plan: Patient would  not survive VATS and talc pleurodesis.  Plan right Pleurx catheter placement for relief of symptoms schedule at [facility] on August 13 after he returns from a well plan family beach trip.  He will stop his Coumadin before surgery last dose on August 10.   Len Childs, MD [facility] [phone]

## 2018-07-04 NOTE — Progress Notes (Unsigned)
cxr 

## 2018-07-05 DIAGNOSIS — D509 Iron deficiency anemia, unspecified: Secondary | ICD-10-CM | POA: Diagnosis not present

## 2018-07-05 DIAGNOSIS — N186 End stage renal disease: Secondary | ICD-10-CM | POA: Diagnosis not present

## 2018-07-05 DIAGNOSIS — Z992 Dependence on renal dialysis: Secondary | ICD-10-CM | POA: Diagnosis not present

## 2018-07-05 DIAGNOSIS — D631 Anemia in chronic kidney disease: Secondary | ICD-10-CM | POA: Diagnosis not present

## 2018-07-05 DIAGNOSIS — N2581 Secondary hyperparathyroidism of renal origin: Secondary | ICD-10-CM | POA: Diagnosis not present

## 2018-07-06 ENCOUNTER — Telehealth: Payer: Self-pay | Admitting: *Deleted

## 2018-07-06 ENCOUNTER — Ambulatory Visit (INDEPENDENT_AMBULATORY_CARE_PROVIDER_SITE_OTHER): Payer: Medicare Other | Admitting: *Deleted

## 2018-07-06 DIAGNOSIS — I255 Ischemic cardiomyopathy: Secondary | ICD-10-CM

## 2018-07-06 DIAGNOSIS — I509 Heart failure, unspecified: Secondary | ICD-10-CM | POA: Diagnosis not present

## 2018-07-06 DIAGNOSIS — I2510 Atherosclerotic heart disease of native coronary artery without angina pectoris: Secondary | ICD-10-CM | POA: Diagnosis not present

## 2018-07-06 DIAGNOSIS — I120 Hypertensive chronic kidney disease with stage 5 chronic kidney disease or end stage renal disease: Secondary | ICD-10-CM | POA: Diagnosis not present

## 2018-07-06 DIAGNOSIS — E663 Overweight: Secondary | ICD-10-CM | POA: Diagnosis not present

## 2018-07-06 DIAGNOSIS — E1122 Type 2 diabetes mellitus with diabetic chronic kidney disease: Secondary | ICD-10-CM | POA: Diagnosis not present

## 2018-07-06 DIAGNOSIS — M79604 Pain in right leg: Secondary | ICD-10-CM | POA: Diagnosis not present

## 2018-07-06 DIAGNOSIS — L89153 Pressure ulcer of sacral region, stage 3: Secondary | ICD-10-CM | POA: Diagnosis not present

## 2018-07-06 DIAGNOSIS — I482 Chronic atrial fibrillation, unspecified: Secondary | ICD-10-CM

## 2018-07-06 DIAGNOSIS — N185 Chronic kidney disease, stage 5: Secondary | ICD-10-CM | POA: Diagnosis not present

## 2018-07-06 DIAGNOSIS — Z6827 Body mass index (BMI) 27.0-27.9, adult: Secondary | ICD-10-CM | POA: Diagnosis not present

## 2018-07-06 DIAGNOSIS — G4733 Obstructive sleep apnea (adult) (pediatric): Secondary | ICD-10-CM | POA: Diagnosis not present

## 2018-07-06 DIAGNOSIS — Z5181 Encounter for therapeutic drug level monitoring: Secondary | ICD-10-CM

## 2018-07-06 DIAGNOSIS — J90 Pleural effusion, not elsewhere classified: Secondary | ICD-10-CM | POA: Diagnosis not present

## 2018-07-06 DIAGNOSIS — E1129 Type 2 diabetes mellitus with other diabetic kidney complication: Secondary | ICD-10-CM | POA: Diagnosis not present

## 2018-07-06 LAB — POCT INR: INR: 1.9 — AB (ref 2.0–3.0)

## 2018-07-06 NOTE — Telephone Encounter (Signed)
Done.  See coumadin note. 

## 2018-07-06 NOTE — Patient Instructions (Signed)
Continue coumadin 1mg  daily  Recheck in 1 week Pending right Pleurx catheter placement @ [facility] on August 13 after he returns from a well plan family beach trip.  He will stop his Coumadin before surgery last dose on August 10. Order given to [facility]

## 2018-07-06 NOTE — Telephone Encounter (Signed)
Tammy with [facility] called INR  1.9  Please call [phone].

## 2018-07-07 ENCOUNTER — Ambulatory Visit (HOSPITAL_COMMUNITY)
Admission: RE | Admit: 2018-07-07 | Discharge: 2018-07-07 | Disposition: A | Discharge status: Home / Self Care | Payer: Medicare Other | Source: Ambulatory Visit | Attending: Physician Assistant | Admitting: Physician Assistant

## 2018-07-07 ENCOUNTER — Other Ambulatory Visit (HOSPITAL_COMMUNITY): Payer: Self-pay | Admitting: Physician Assistant

## 2018-07-07 ENCOUNTER — Ambulatory Visit (HOSPITAL_COMMUNITY)
Admission: RE | Admit: 2018-07-07 | Discharge: 2018-07-07 | Disposition: A | Discharge status: Home / Self Care | Payer: Medicare Other | Source: Ambulatory Visit | Attending: Cardiothoracic Surgery | Admitting: Cardiothoracic Surgery

## 2018-07-07 ENCOUNTER — Other Ambulatory Visit: Payer: Self-pay | Admitting: Cardiothoracic Surgery

## 2018-07-07 ENCOUNTER — Encounter (HOSPITAL_COMMUNITY): Payer: Self-pay | Admitting: Physician Assistant

## 2018-07-07 DIAGNOSIS — N186 End stage renal disease: Secondary | ICD-10-CM | POA: Diagnosis not present

## 2018-07-07 DIAGNOSIS — J90 Pleural effusion, not elsewhere classified: Secondary | ICD-10-CM

## 2018-07-07 DIAGNOSIS — Z992 Dependence on renal dialysis: Secondary | ICD-10-CM | POA: Insufficient documentation

## 2018-07-07 DIAGNOSIS — D631 Anemia in chronic kidney disease: Secondary | ICD-10-CM | POA: Diagnosis not present

## 2018-07-07 DIAGNOSIS — D509 Iron deficiency anemia, unspecified: Secondary | ICD-10-CM | POA: Diagnosis not present

## 2018-07-07 DIAGNOSIS — J984 Other disorders of lung: Secondary | ICD-10-CM | POA: Diagnosis not present

## 2018-07-07 DIAGNOSIS — I5032 Chronic diastolic (congestive) heart failure: Secondary | ICD-10-CM | POA: Insufficient documentation

## 2018-07-07 HISTORY — PX: IR THORACENTESIS RIGHT ASP PLEURAL SPACE W/IMG GUIDE: IMG5380

## 2018-07-07 MED ORDER — LIDOCAINE HCL (PF) 2 % IJ SOLN
INTRAMUSCULAR | Status: DC | PRN
  Administered 2018-07-07: 10 mL

## 2018-07-07 MED ORDER — LIDOCAINE HCL (PF) 2 % IJ SOLN
INTRAMUSCULAR | Status: AC
  Filled 2018-07-07: qty 20

## 2018-07-07 NOTE — Procedures (Signed)
PROCEDURE SUMMARY:  Successful image-guided right thoracentesis. Yielded 1.4 liters of amber fluid. Patient tolerated procedure well. No immediate complications.  Specimen was not sent for labs. CXR ordered.  Joaquim Nam PA-C 07/07/2018 9:36 AM

## 2018-07-11 DIAGNOSIS — I129 Hypertensive chronic kidney disease with stage 1 through stage 4 chronic kidney disease, or unspecified chronic kidney disease: Secondary | ICD-10-CM | POA: Diagnosis not present

## 2018-07-11 DIAGNOSIS — Z992 Dependence on renal dialysis: Secondary | ICD-10-CM | POA: Diagnosis not present

## 2018-07-11 DIAGNOSIS — N186 End stage renal disease: Secondary | ICD-10-CM | POA: Diagnosis not present

## 2018-07-13 DIAGNOSIS — N186 End stage renal disease: Secondary | ICD-10-CM | POA: Diagnosis not present

## 2018-07-13 DIAGNOSIS — Z992 Dependence on renal dialysis: Secondary | ICD-10-CM | POA: Diagnosis not present

## 2018-07-17 ENCOUNTER — Other Ambulatory Visit: Payer: Self-pay

## 2018-07-17 ENCOUNTER — Encounter (HOSPITAL_COMMUNITY): Payer: Self-pay | Admitting: *Deleted

## 2018-07-17 ENCOUNTER — Emergency Department (HOSPITAL_COMMUNITY): Payer: Medicare Other

## 2018-07-17 ENCOUNTER — Inpatient Hospital Stay (HOSPITAL_COMMUNITY)
Admission: EM | Admit: 2018-07-17 | Discharge: 2018-08-06 | DRG: 189 | Disposition: E | Payer: Medicare Other | Attending: Critical Care Medicine | Admitting: Critical Care Medicine

## 2018-07-17 DIAGNOSIS — L89153 Pressure ulcer of sacral region, stage 3: Secondary | ICD-10-CM | POA: Diagnosis present

## 2018-07-17 DIAGNOSIS — E875 Hyperkalemia: Secondary | ICD-10-CM | POA: Diagnosis present

## 2018-07-17 DIAGNOSIS — Z8619 Personal history of other infectious and parasitic diseases: Secondary | ICD-10-CM

## 2018-07-17 DIAGNOSIS — Z992 Dependence on renal dialysis: Secondary | ICD-10-CM | POA: Diagnosis not present

## 2018-07-17 DIAGNOSIS — G4733 Obstructive sleep apnea (adult) (pediatric): Secondary | ICD-10-CM | POA: Diagnosis present

## 2018-07-17 DIAGNOSIS — Z6824 Body mass index (BMI) 24.0-24.9, adult: Secondary | ICD-10-CM

## 2018-07-17 DIAGNOSIS — E785 Hyperlipidemia, unspecified: Secondary | ICD-10-CM | POA: Diagnosis present

## 2018-07-17 DIAGNOSIS — R140 Abdominal distension (gaseous): Secondary | ICD-10-CM | POA: Diagnosis not present

## 2018-07-17 DIAGNOSIS — Z8546 Personal history of malignant neoplasm of prostate: Secondary | ICD-10-CM

## 2018-07-17 DIAGNOSIS — E8770 Fluid overload, unspecified: Secondary | ICD-10-CM | POA: Diagnosis not present

## 2018-07-17 DIAGNOSIS — E872 Acidosis: Secondary | ICD-10-CM | POA: Diagnosis present

## 2018-07-17 DIAGNOSIS — R188 Other ascites: Secondary | ICD-10-CM | POA: Diagnosis present

## 2018-07-17 DIAGNOSIS — Z7901 Long term (current) use of anticoagulants: Secondary | ICD-10-CM

## 2018-07-17 DIAGNOSIS — Z933 Colostomy status: Secondary | ICD-10-CM

## 2018-07-17 DIAGNOSIS — Z66 Do not resuscitate: Secondary | ICD-10-CM | POA: Diagnosis present

## 2018-07-17 DIAGNOSIS — Z955 Presence of coronary angioplasty implant and graft: Secondary | ICD-10-CM

## 2018-07-17 DIAGNOSIS — D631 Anemia in chronic kidney disease: Secondary | ICD-10-CM | POA: Diagnosis present

## 2018-07-17 DIAGNOSIS — Z515 Encounter for palliative care: Secondary | ICD-10-CM | POA: Diagnosis not present

## 2018-07-17 DIAGNOSIS — I132 Hypertensive heart and chronic kidney disease with heart failure and with stage 5 chronic kidney disease, or end stage renal disease: Secondary | ICD-10-CM | POA: Diagnosis present

## 2018-07-17 DIAGNOSIS — I252 Old myocardial infarction: Secondary | ICD-10-CM

## 2018-07-17 DIAGNOSIS — E871 Hypo-osmolality and hyponatremia: Secondary | ICD-10-CM | POA: Diagnosis present

## 2018-07-17 DIAGNOSIS — J90 Pleural effusion, not elsewhere classified: Secondary | ICD-10-CM | POA: Diagnosis not present

## 2018-07-17 DIAGNOSIS — I2510 Atherosclerotic heart disease of native coronary artery without angina pectoris: Secondary | ICD-10-CM | POA: Diagnosis present

## 2018-07-17 DIAGNOSIS — N186 End stage renal disease: Secondary | ICD-10-CM | POA: Diagnosis present

## 2018-07-17 DIAGNOSIS — J810 Acute pulmonary edema: Secondary | ICD-10-CM

## 2018-07-17 DIAGNOSIS — R64 Cachexia: Secondary | ICD-10-CM | POA: Diagnosis present

## 2018-07-17 DIAGNOSIS — K7460 Unspecified cirrhosis of liver: Secondary | ICD-10-CM | POA: Diagnosis present

## 2018-07-17 DIAGNOSIS — I5042 Chronic combined systolic (congestive) and diastolic (congestive) heart failure: Secondary | ICD-10-CM | POA: Diagnosis present

## 2018-07-17 DIAGNOSIS — Z85048 Personal history of other malignant neoplasm of rectum, rectosigmoid junction, and anus: Secondary | ICD-10-CM

## 2018-07-17 DIAGNOSIS — I482 Chronic atrial fibrillation: Secondary | ICD-10-CM | POA: Diagnosis present

## 2018-07-17 DIAGNOSIS — D6959 Other secondary thrombocytopenia: Secondary | ICD-10-CM | POA: Diagnosis present

## 2018-07-17 DIAGNOSIS — K219 Gastro-esophageal reflux disease without esophagitis: Secondary | ICD-10-CM | POA: Diagnosis present

## 2018-07-17 DIAGNOSIS — J9600 Acute respiratory failure, unspecified whether with hypoxia or hypercapnia: Secondary | ICD-10-CM | POA: Diagnosis not present

## 2018-07-17 DIAGNOSIS — E669 Obesity, unspecified: Secondary | ICD-10-CM | POA: Diagnosis present

## 2018-07-17 DIAGNOSIS — Z923 Personal history of irradiation: Secondary | ICD-10-CM

## 2018-07-17 DIAGNOSIS — Z7951 Long term (current) use of inhaled steroids: Secondary | ICD-10-CM

## 2018-07-17 DIAGNOSIS — Z7982 Long term (current) use of aspirin: Secondary | ICD-10-CM

## 2018-07-17 DIAGNOSIS — Z79890 Hormone replacement therapy (postmenopausal): Secondary | ICD-10-CM

## 2018-07-17 DIAGNOSIS — Z8249 Family history of ischemic heart disease and other diseases of the circulatory system: Secondary | ICD-10-CM

## 2018-07-17 DIAGNOSIS — I959 Hypotension, unspecified: Secondary | ICD-10-CM | POA: Diagnosis not present

## 2018-07-17 DIAGNOSIS — Z9115 Patient's noncompliance with renal dialysis: Secondary | ICD-10-CM

## 2018-07-17 DIAGNOSIS — Z800 Family history of malignant neoplasm of digestive organs: Secondary | ICD-10-CM

## 2018-07-17 DIAGNOSIS — E1142 Type 2 diabetes mellitus with diabetic polyneuropathy: Secondary | ICD-10-CM | POA: Diagnosis present

## 2018-07-17 DIAGNOSIS — J9601 Acute respiratory failure with hypoxia: Principal | ICD-10-CM | POA: Diagnosis present

## 2018-07-17 DIAGNOSIS — E1122 Type 2 diabetes mellitus with diabetic chronic kidney disease: Secondary | ICD-10-CM | POA: Diagnosis present

## 2018-07-17 DIAGNOSIS — I255 Ischemic cardiomyopathy: Secondary | ICD-10-CM | POA: Diagnosis present

## 2018-07-17 DIAGNOSIS — Z833 Family history of diabetes mellitus: Secondary | ICD-10-CM

## 2018-07-17 DIAGNOSIS — Z95810 Presence of automatic (implantable) cardiac defibrillator: Secondary | ICD-10-CM

## 2018-07-17 DIAGNOSIS — Z79899 Other long term (current) drug therapy: Secondary | ICD-10-CM

## 2018-07-17 DIAGNOSIS — I9589 Other hypotension: Secondary | ICD-10-CM | POA: Diagnosis present

## 2018-07-17 DIAGNOSIS — Z9221 Personal history of antineoplastic chemotherapy: Secondary | ICD-10-CM

## 2018-07-17 DIAGNOSIS — D6489 Other specified anemias: Secondary | ICD-10-CM | POA: Diagnosis present

## 2018-07-17 DIAGNOSIS — Z8673 Personal history of transient ischemic attack (TIA), and cerebral infarction without residual deficits: Secondary | ICD-10-CM

## 2018-07-17 LAB — BASIC METABOLIC PANEL
Anion gap: 18 — ABNORMAL HIGH (ref 5–15)
BUN: 65 mg/dL — ABNORMAL HIGH (ref 8–23)
CALCIUM: 9.1 mg/dL (ref 8.9–10.3)
CHLORIDE: 99 mmol/L (ref 98–111)
CO2: 17 mmol/L — AB (ref 22–32)
CREATININE: 4.09 mg/dL — AB (ref 0.61–1.24)
GFR calc Af Amer: 16 mL/min — ABNORMAL LOW (ref >60)
GFR calc non Af Amer: 14 mL/min — ABNORMAL LOW (ref >60)
GLUCOSE: 64 mg/dL — AB (ref 70–99)
Potassium: 5.2 mmol/L — ABNORMAL HIGH (ref 3.5–5.1)
Sodium: 134 mmol/L — ABNORMAL LOW (ref 135–145)

## 2018-07-17 LAB — BRAIN NATRIURETIC PEPTIDE: B Natriuretic Peptide: >4500 pg/mL — ABNORMAL HIGH (ref 0.0–100.0)

## 2018-07-17 LAB — PROTIME-INR
INR: 3.83
PROTHROMBIN TIME: 37.4 s — AB (ref 11.4–15.2)

## 2018-07-17 LAB — I-STAT CHEM 8, ED
BUN: 85 mg/dL — AB (ref 8–23)
Calcium, Ion: 0.91 mmol/L — ABNORMAL LOW (ref 1.15–1.40)
Chloride: 105 mmol/L (ref 98–111)
Creatinine, Ser: 3.9 mg/dL — ABNORMAL HIGH (ref 0.61–1.24)
Glucose, Bld: 61 mg/dL — ABNORMAL LOW (ref 70–99)
HEMATOCRIT: 37 % — AB (ref 39.0–52.0)
HEMOGLOBIN: 12.6 g/dL — AB (ref 13.0–17.0)
Potassium: 5.7 mmol/L — ABNORMAL HIGH (ref 3.5–5.1)
SODIUM: 131 mmol/L — AB (ref 135–145)
TCO2: 18 mmol/L — ABNORMAL LOW (ref 22–32)

## 2018-07-17 LAB — I-STAT CG4 LACTIC ACID, ED: LACTIC ACID, VENOUS: 4.31 mmol/L — AB (ref 0.5–1.9)

## 2018-07-17 LAB — CBC
HCT: 34.3 % — ABNORMAL LOW (ref 39.0–52.0)
HEMOGLOBIN: 10.9 g/dL — AB (ref 13.0–17.0)
MCH: 35.3 pg — AB (ref 26.0–34.0)
MCHC: 31.8 g/dL (ref 30.0–36.0)
MCV: 111 fL — AB (ref 78.0–100.0)
PLATELETS: 82 10*3/uL — AB (ref 150–400)
RBC: 3.09 MIL/uL — ABNORMAL LOW (ref 4.22–5.81)
RDW: 17.9 % — ABNORMAL HIGH (ref 11.5–15.5)
WBC: 23.5 10*3/uL — ABNORMAL HIGH (ref 4.0–10.5)

## 2018-07-17 LAB — HEPATIC FUNCTION PANEL
ALBUMIN: 2.4 g/dL — AB (ref 3.5–5.0)
ALK PHOS: 253 U/L — AB (ref 38–126)
ALT: 21 U/L (ref 0–44)
AST: 61 U/L — AB (ref 15–41)
BILIRUBIN INDIRECT: 2.1 mg/dL — AB (ref 0.3–0.9)
Bilirubin, Direct: 3.1 mg/dL — ABNORMAL HIGH (ref 0.0–0.2)
TOTAL PROTEIN: 6.2 g/dL — AB (ref 6.5–8.1)
Total Bilirubin: 5.2 mg/dL — ABNORMAL HIGH (ref 0.3–1.2)

## 2018-07-17 LAB — CBG MONITORING, ED
GLUCOSE-CAPILLARY: 60 mg/dL — AB (ref 70–99)
GLUCOSE-CAPILLARY: 107 mg/dL — AB (ref 70–99)

## 2018-07-17 LAB — LACTIC ACID, PLASMA: Lactic Acid, Venous: 4.5 mmol/L (ref 0.5–1.9)

## 2018-07-17 LAB — APTT: APTT: 54 s — AB (ref 24–36)

## 2018-07-17 LAB — I-STAT TROPONIN, ED: Troponin i, poc: 0.02 ng/mL (ref 0.00–0.08)

## 2018-07-17 LAB — TROPONIN I: TROPONIN I: 0.05 ng/mL — AB (ref <0.03)

## 2018-07-17 LAB — AMMONIA: Ammonia: 35 umol/L (ref 9–35)

## 2018-07-17 MED ORDER — LEVOTHYROXINE SODIUM 75 MCG PO TABS: 75.0000 ug | ORAL_TABLET | Freq: Every day | ORAL | Status: DC

## 2018-07-17 MED ORDER — ONDANSETRON HCL 4 MG/2ML IJ SOLN
2.0000 mg | Freq: Once | INTRAMUSCULAR | Status: DC
  Filled 2018-07-17: qty 2

## 2018-07-17 MED ORDER — MORPHINE 100MG IN NS 100ML (1MG/ML) PREMIX INFUSION
1.0000 mg/h | INTRAVENOUS | Status: DC
  Administered 2018-07-17: 5 mg/h via INTRAVENOUS
  Filled 2018-07-17: qty 100

## 2018-07-17 MED ORDER — ALBUTEROL SULFATE (2.5 MG/3ML) 0.083% IN NEBU
10.0000 mg | INHALATION_SOLUTION | Freq: Once | RESPIRATORY_TRACT | Status: AC
  Administered 2018-07-17: 10 mg via RESPIRATORY_TRACT
  Filled 2018-07-17: qty 12

## 2018-07-17 MED ORDER — MORPHINE BOLUS VIA INFUSION
1.0000 mg | INTRAVENOUS | Status: DC | PRN
  Filled 2018-07-17: qty 4

## 2018-07-17 MED ORDER — FUROSEMIDE 10 MG/ML IJ SOLN
80.0000 mg | INTRAMUSCULAR | Status: AC
  Administered 2018-07-17: 80 mg via INTRAVENOUS
  Filled 2018-07-17: qty 8

## 2018-07-17 MED ORDER — LORAZEPAM 1 MG PO TABS: 1.0000 mg | ORAL_TABLET | ORAL | Status: DC | PRN

## 2018-07-17 MED ORDER — ONDANSETRON HCL 4 MG/2ML IJ SOLN: 4.0000 mg | Freq: Four times a day (QID) | INTRAMUSCULAR | Status: DC | PRN

## 2018-07-17 MED ORDER — DEXTROSE 5 % IV SOLN: INTRAVENOUS | Status: DC

## 2018-07-17 MED ORDER — CHLORHEXIDINE GLUCONATE CLOTH 2 % EX PADS
6.0000 | MEDICATED_PAD | Freq: Every day | CUTANEOUS | Status: DC
  Administered 2018-07-17: 6 via TOPICAL

## 2018-07-17 MED ORDER — LORAZEPAM 2 MG/ML IJ SOLN: 1.0000 mg | INTRAMUSCULAR | Status: DC | PRN

## 2018-07-17 MED ORDER — GLYCOPYRROLATE 0.2 MG/ML IJ SOLN: 0.2000 mg | INTRAMUSCULAR | Status: DC | PRN

## 2018-07-17 MED ORDER — SODIUM CHLORIDE 0.9 % IV SOLN
1500.0000 mg | Freq: Once | INTRAVENOUS | Status: AC
  Administered 2018-07-17: 1500 mg via INTRAVENOUS
  Filled 2018-07-17: qty 1500

## 2018-07-17 MED ORDER — DEXTROSE 50 % IV SOLN
1.0000 | Freq: Once | INTRAVENOUS | Status: AC
  Administered 2018-07-17: 50 mL via INTRAVENOUS
  Filled 2018-07-17: qty 50

## 2018-07-17 MED ORDER — NALOXONE HCL 0.4 MG/ML IJ SOLN
0.4000 mg | Freq: Once | INTRAMUSCULAR | Status: AC
  Administered 2018-07-17: 0.4 mg via INTRAVENOUS
  Filled 2018-07-17: qty 1

## 2018-07-17 MED ORDER — MIDODRINE HCL 5 MG PO TABS: 15.0000 mg | ORAL_TABLET | Freq: Two times a day (BID) | ORAL | Status: DC

## 2018-07-17 MED ORDER — LORAZEPAM 2 MG/ML PO CONC: 1.0000 mg | ORAL | Status: DC | PRN

## 2018-07-17 MED ORDER — ONDANSETRON 4 MG PO TBDP: 4.0000 mg | ORAL_TABLET | Freq: Four times a day (QID) | ORAL | Status: DC | PRN

## 2018-07-17 MED ORDER — GLYCOPYRROLATE 1 MG PO TABS
1.0000 mg | ORAL_TABLET | ORAL | Status: DC | PRN
  Filled 2018-07-17: qty 1

## 2018-07-17 MED ORDER — SODIUM CHLORIDE 0.9 % IV SOLN
1.0000 g | Freq: Once | INTRAVENOUS | Status: AC
  Administered 2018-07-17: 1 g via INTRAVENOUS
  Filled 2018-07-17: qty 1

## 2018-07-17 MED ORDER — SODIUM BICARBONATE 8.4 % IV SOLN
50.0000 meq | Freq: Once | INTRAVENOUS | Status: AC
  Administered 2018-07-17: 50 meq via INTRAVENOUS
  Filled 2018-07-17: qty 50

## 2018-07-18 ENCOUNTER — Ambulatory Visit (HOSPITAL_COMMUNITY): Admission: RE | Admit: 2018-07-18 | Payer: Medicare Other | Source: Ambulatory Visit | Admitting: Cardiothoracic Surgery

## 2018-07-18 SURGERY — INSERTION, PLEURAL DRAINAGE CATHETER
Anesthesia: Monitor Anesthesia Care | Site: Chest | Laterality: Right

## 2018-07-22 LAB — CULTURE, BLOOD (ROUTINE X 2)
CULTURE: NO GROWTH
Culture: NO GROWTH
Special Requests: ADEQUATE

## 2018-08-06 NOTE — Progress Notes (Signed)
Patient is now comfort care, no dialysis.  Will sign off.   Kelly Splinter MD [employer] 07-20-18, 7:36 AM

## 2018-08-06 NOTE — Progress Notes (Signed)
Patient transported to 2H09 via BIPAP without incidence.  Report called to receiving RT.

## 2018-08-06 NOTE — ED Notes (Signed)
Dr Randal Buba informed of lactic acid results 4.31

## 2018-08-06 NOTE — Consult Note (Signed)
Renal Service Consult Note [facility]  [name] 2018-08-10 Sol Blazing Requesting Physician:  Dr. Randal Buba  Reason for Consult:  ESRD pt w/ resp failure, R pleural effusion HPI: The patient is a 62 y.o. year-old with hx of cirrhosis, severe CM EF 20-25%, ICD, ESRD on HD since Aug 2018. Takes HD in [place], [place].    Missed one HD and presented in resp distress to ED here.  On bipap in ED now.    No fevers , coughing up blood-tinged phlegm.    Na 131 K 5.7  BUN 85  Cr 3.90  Hb 12.6  CXR ^'d R basilar infiltrate and R effusion from prior, L clear    Echart:   May 2018 - bleeding from ostomy stoma, eliquis changed to coumadin, ckd IV, cirrhosis   July 2018 - a/c CHF combined/ cardiorenal/ CKD IV/ chronic afib/ DM2/ CAD/ hx rectal ca   Aug 2018 - MSSA bacteremia TEE neg for endocarditis / vegetation on valves or ICD. No icd extraction due to severe comorbidities. 6 wks IV abx then suppressive chronic Rx w/ keflex.  A/C CHF /cardiogendic shock. CRRT, then HD w/ midodrine for BP support. DC'd to HD at [facility]. Cirrhosis. Went to CIR for severe debility.     Sept 2018 - HCAP, ESRD , sacral decub unstageable Rx hydrotherapy. TTS HD. PAF. EF 15-20%.  Chronic keflex.     July 2019 - recurrent R pleural effusion d/t severe CM/ low EF. Thoracentesis on 7/26 1400 cc off.  Referred then to thoracic surg for consideration of talc pleurodesis, wife to reschedule appt.  ESRD on HD, midodrine.  PAF sp ablation.  Hx CID. On coumadin.  Stage III sacral decub.    ROS  denies CP  no joint pain   no HA  no blurry vision  no rash  no diarrhea  no nausea/ vomiting    Past Medical History  Past Medical History:  Diagnosis Date  . Adenocarcinoma of rectum ([place])    a. 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 AICD (automatic cardioverter/defibrillator) present 2002   BI V ICD  . Anemia   . CAD (coronary artery disease)    a. BMS to LAD 2001 at [facility] b. PTCA/atherectomy ramus and BMS to LAD  2009  . CHF (congestive heart failure) ([place])   . Cholelithiasis 06/2015  . Chronic kidney disease, stage IV (severe) ([place])   . Chronic systolic heart failure ([place])   . Cirrhosis ([place])   . Colostomy in place [facility])   . Dizziness    a. chronic. Admission for this 07/18/2014  . DM type 2, uncontrolled, with renal complications ([place])   . Dysrhythmia   . Essential hypertension, benign   . GERD (gastroesophageal reflux disease)   . HCAP (healthcare-associated pneumonia) 07/21/2015  . Hematuria   . History of blood transfusion    "I've had 2 units so far this year" (09/27/2015)  . HLD (hyperlipidemia)   . Ischemic cardiomyopathy    EF 18% by nuclear study 2016, multiple myocardial infarctions in past    . Myocardial infarction ([place]) 2001  . Obesity   . Orthostatic hypotension   . OSA on CPAP   . Paroxysmal atrial fibrillation ([facility])    a. on amiodarone, digoxin and Eliquis  . PONV (postoperative nausea and vomiting)   . Presence of permanent cardiac pacemaker   . Prostate cancer ([place])    a. s/p seed implants with chemo and radiation  . SAH (subarachnoid hemorrhage) ([place])  post-traumatic (fall) [facility] 12/2014  . TIA (transient ischemic attack)    Past Surgical History  Past Surgical History:  Procedure Laterality Date  . A/V FISTULAGRAM Right 05/04/2018   Procedure: A/V FISTULAGRAM;  Surgeon: Conrad Tracy, MD;  Location: [place] CV LAB;  Service: Cardiovascular;  Laterality: Right;  . Abdominal and Perineal Resection of Rectum with Total Mesorectal Excision  10/04/2007  . AV FISTULA PLACEMENT Right 09/16/2015   Procedure: ARTERIOVENOUS (AV) FISTULA CREATION - BRACHIOCEPHALIC;  Surgeon: Elam Dutch, MD;  Location: [place];  Service: Vascular;  Laterality: Right;  . BI-VENTRICULAR IMPLANTABLE CARDIOVERTER DEFIBRILLATOR  (CRT-D)  2009  . CARDIAC CATHETERIZATION  08/2001; 2009   ; [name] 07/10/2013  . CARDIAC DEFIBRILLATOR PLACEMENT  2002  . CARDIAC DEFIBRILLATOR PLACEMENT  2009    Upgraded to a BiV ICD  . COLONOSCOPY  09/14/2011   Dr. Gala Romney: via colostomy, Single pedunculated benign inflammatory polyp. Due for surveillance Oct 2015  . COLONOSCOPY N/A 07/02/2014   Procedure: COLONOSCOPY;  Surgeon: Daneil Dolin, MD;  [facility];  Service: Endoscopy;  Laterality: N/A;  7:30 / COLONOSCOPY THRU COLOSTOMY  . COLONOSCOPY N/A 12/11/2014   Dr. Gala Romney via colostomy. Normal. Repeat in 2021.   Marland Kitchen COLONOSCOPY N/A 08/24/2015   Dr. Havery Moros: diminutive polyp not removed, stoma site of bleeding  . COLOSTOMY  2008  . CORONARY ANGIOPLASTY WITH STENT PLACEMENT  2001; ~ 2006   "1 + 1"   . ELECTROPHYSIOLOGIC STUDY N/A 08/28/2015   Procedure: AV Node Ablation;  Surgeon: Will Meredith Leeds, MD;  Location: [place] CV LAB;  Service: Cardiovascular;  Laterality: N/A;  . EP IMPLANTABLE DEVICE N/A 04/10/2015   Procedure: Ppm/Biv Ppm Generator Changeout;  Surgeon: Evans Lance, MD;  Location: [place] INVASIVE CV LAB CUPID;  Service: Cardiovascular;  Laterality: N/A;  . ERCP N/A 06/11/2016   Procedure: ENDOSCOPIC RETROGRADE CHOLANGIOPANCREATOGRAPHY (ERCP);  Surgeon: Teena Irani, MD;  Location: [facility] ENDOSCOPY;  Service: Endoscopy;  Laterality: N/A;  . ERCP N/A 01/26/2017   Procedure: ENDOSCOPIC RETROGRADE CHOLANGIOPANCREATOGRAPHY (ERCP);  Surgeon: Teena Irani, MD;  Location: [facility] ENDOSCOPY;  Service: Endoscopy;  Laterality: N/A;  . ERCP  06/2016   Dr. Amedeo Plenty: duodenal fistula, dilated bile duct but no obvious stones, s/p sphincterotomy and stent placement  . ERCP  01/2017   Dr. Amedeo Plenty: CBD stones, s/p sphincterotomy, balloon extraction, stent removal  . ESOPHAGOGASTRODUODENOSCOPY N/A 07/02/2014   Procedure: ESOPHAGOGASTRODUODENOSCOPY (EGD);  Surgeon: Daneil Dolin, MD;  [facility];  Service: Endoscopy;  Laterality: N/A;  7:30  . ESOPHAGOGASTRODUODENOSCOPY N/A 12/11/2014   [id number] EGD  . ESOPHAGOGASTRODUODENOSCOPY (EGD) WITH PROPOFOL N/A 04/18/2017   Procedure:  ESOPHAGOGASTRODUODENOSCOPY (EGD) WITH PROPOFOL;  Surgeon: Daneil Dolin, MD;  [facility];  Service: Endoscopy;  Laterality: N/A;  2:30pm  . GASTROINTESTINAL STENT REMOVAL N/A 01/26/2017   Procedure: GASTROINTESTINAL STENT REMOVAL;  Surgeon: Teena Irani, MD;  Location: [place];  Service: Endoscopy;  Laterality: N/A;  . GIVENS CAPSULE STUDY N/A 07/23/2015   Dr. Michail Sermon: small non-bleeding AVM, mild gastritis  . INSERTION OF DIALYSIS CATHETER Right 05/04/2018   Procedure: INSERTION OF DIALYSIS CATHETER;  Surgeon: Angelia Mould, MD;  Location: [place];  Service: Vascular;  Laterality: Right;  . IR FLUORO GUIDE CV LINE RIGHT  07/27/2017  . IR REMOVAL TUN CV CATH W/O FL  08/12/2017  . IR THORACENTESIS ASP PLEURAL SPACE W/IMG GUIDE  11/07/2017  . IR THORACENTESIS ASP PLEURAL SPACE W/IMG GUIDE  05/30/2018  .  IR THORACENTESIS ASP PLEURAL SPACE W/IMG GUIDE  07/07/2018  . IR US GUIDE VASC ACCESS RIGHT  07/27/2017  . LEFT HEART CATHETERIZATION WITH CORONARY ANGIOGRAM N/A 07/13/2013   Procedure: LEFT HEART CATHETERIZATION WITH CORONARY ANGIOGRAM;  Surgeon: Lorretta Harp, MD;  Location: [facility] CATH LAB;  Service: Cardiovascular;  Laterality: N/A;  . Venia Minks DILATION N/A 07/02/2014   Procedure: Venia Minks DILATION;  Surgeon: Daneil Dolin, MD;  [facility];  Service: Endoscopy;  Laterality: N/A;  7:30  . MALONEY DILATION N/A 04/18/2017   Procedure: Venia Minks DILATION;  Surgeon: Daneil Dolin, MD;  [facility];  Service: Endoscopy;  Laterality: N/A;  . PORTACATH PLACEMENT  06/2007   "removed ~ 1 yr later"  . RIGHT HEART CATHETERIZATION N/A 02/24/2015   Procedure: RIGHT HEART CATH;  Surgeon: Jolaine Artist, MD;  Location: [facility] C[facility] LAB;  Service: Cardiovascular;  Laterality: N/A;  . SAVORY DILATION N/A 07/02/2014   Procedure: SAVORY DILATION;  Surgeon: Daneil Dolin, MD;  [facility];  Service: Endoscopy;  Laterality: N/A;  7:30   Family History  Family History   Problem Relation Age of Onset  . Colon cancer Mother 75  . Coronary artery disease Father   . Colon cancer Sister 53  . Diabetes Sister   . Colon cancer Other        2 cousins, succumbed to illness   Social History  reports that he has never smoked. He has never used smokeless tobacco. He reports that he does not drink alcohol or use drugs. Allergies No Known Allergies Home medications Prior to Admission medications   Medication Sig Start Date End Date Taking? Authorizing Provider  ALPRAZolam Duanne Moron) 0.5 MG tablet Take 0.5 mg by mouth 3 (three) times daily as needed. 05/25/18  Yes [provider]  atorvastatin (LIPITOR) 10 MG tablet Take 1 tablet (10 mg total) by mouth daily. 12/28/17 07/17/26 Yes Bensimhon, Shaune Pascal, MD  cephALEXin (KEFLEX) 250 MG capsule Take 250 capsules by mouth 2 (two) times daily. Staph infection 10/25/17  Yes [provider]  fexofenadine (ALLEGRA) 180 MG tablet Take 180 mg by mouth daily.   Yes [provider]  fluticasone (FLONASE) 50 MCG/ACT nasal spray Place 2 sprays into both nostrils daily. 06/22/18  Yes [provider]  levothyroxine (SYNTHROID, LEVOTHROID) 75 MCG tablet Take 75 mcg by mouth daily before breakfast.   Yes [provider]  midodrine (PROAMATINE) 5 MG tablet TAKE 3 TABLETS (15 MG TOTAL) BY MOUTH 2 (TWO) TIMES DAILY WITH A MEAL. 04/12/18  Yes Bensimhon, Shaune Pascal, MD  nitroGLYCERIN (NITROSTAT) 0.4 MG SL tablet Place 1 tablet (0.4 mg total) under the tongue every 5 (five) minutes as needed for chest pain. 08/19/17 03/31/19 Yes Angiulli, Lavon Paganini, PA-C  Oxycodone HCl 10 MG TABS Take 10 mg by mouth every 4 (four) hours as needed. For pain 05/25/18  Yes [provider]  pantoprazole (PROTONIX) 40 MG tablet Take 1 tablet (40 mg total) by mouth daily. 08/19/17  Yes Angiulli, Lavon Paganini, PA-C  PROAIR RESPICLICK 188 (90 BASE) MCG/ACT AEPB Inhale 1 puff into the lungs every 6 (six) hours as needed (for breathing).   08/15/15  Yes [provider]  sevelamer carbonate (RENVELA) 800 MG tablet Take 800-1,600 mg by mouth See admin instructions. 2 tablets with meals and 1 tablet with snacks 06/25/18  Yes [provider]  warfarin (COUMADIN) 1 MG tablet TAKE 1 TABLET BY MOUTH EVERY DAY Patient taking differently:  Take 1 mg by mouth every evening.  04/24/18  Yes Bensimhon, Shaune Pascal, MD  aspirin EC 81 MG tablet Take 1 tablet (81 mg total) by mouth daily. Patient not taking: Reported on 07-30-18 05/04/18 05/04/19  Conrad Yakima, MD  glucose blood test strip Use to test blood sugar 3 times daily 08/19/17   Angiulli, Lavon Paganini, PA-C  sucroferric oxyhydroxide (VELPHORO) 500 MG chewable tablet Chew 1 tablet (500 mg total) by mouth 3 (three) times daily with meals. Patient not taking: Reported on July 30, 2018 08/19/17   Angiulli, Lavon Paganini, PA-C   Liver Function Tests No results for input(s): AST, ALT, ALKPHOS, BILITOT, PROT, ALBUMIN in the last 168 hours. No results for input(s): LIPASE, AMYLASE in the last 168 hours. CBC Recent Labs  Lab 07-30-18 0114 07-30-18 0226  WBC 23.5*  --   HGB 10.9* 12.6*  HCT 34.3* 37.0*  MCV 111.0*  --   PLT 82*  --    Basic Metabolic Panel Recent Labs  Lab 07-30-18 0114 07/30/2018 0226  NA 134* 131*  K 5.2* 5.7*  CL 99 105  CO2 17*  --   GLUCOSE 64* 61*  BUN 65* 85*  CREATININE 4.09* 3.90*  CALCIUM 9.1  --    Iron/TIBC/Ferritin/ %Sat    Component Value Date/Time   IRON 22 (L) 07/25/2017 1144   TIBC 225 (L) 07/25/2017 1144   FERRITIN 238 07/12/2017 0424   IRONPCTSAT 10 (L) 07/25/2017 1144   IRONPCTSAT 17 02/24/2017 0915    Vitals:   July 30, 2018 0115 2018/07/30 0130 2018-07-30 0200 07-30-18 0217  BP: 92/61 (!) 102/50 (!) 87/51   Pulse: 70 75 77 71  Resp: (!) 23 (!) 21 (!) 26 (!) 25  Temp:      TempSrc:      SpO2: 91% 90% 92% 92%  Weight:      Height:       Exam Gen frail adult male, cachectic, on bipap , awake, follows simple commands No rash, cyanosis or  gangrene Sclera anicteric, throat not seen  No jvd or bruits Chest coarse rales throughout lower and mid lung fields bilat, dec'd R base RRR no MRG Abd soft massive ascites, nontender, +bs GU normal male MS no joint effusions or deformity Ext 1-2+ bilat LE pitting edema, no wounds or ulcers Neuro is alert and nonfocal, gen weak    Home meds:  - xanax prn/ oxy IR prn  - midodrine 15 bid  - atorvastatin 10/ synthroid 75 / sl ntg prn/ PPI/ renvela ac tid/ velphoro ac  - ecasa 81 / coumadin as direct  Dialysis: [facility]  R IJ TDC   Impression: 1  Resp distress - combination of large R effusion/ pulm edema/ severe ascites.  Pt is poor CRRT candidate given severe comorbidities.  Very unstable resp status.  Best option would be transition to hospice care given trajectory of his disease.  If dialysis is to be considered could be done in ICU w/ pressor support prn due to hypotension, but high risk for complications. Pt is DNR.  Have d/w primary CCM, await their assessment.  2  ESRD on HD - [facility] 3  Cirrhosis - severe ascites 4  Volume overload - severe R and L sided  5  Hypotension - chronic issue on midodrine 6  ICM / ICD/ EF 20-25%  Plan - as above  Kelly Splinter MD [employer] pager [phone]   2018-07-30, 2:46 AM

## 2018-08-06 NOTE — ED Triage Notes (Signed)
The pt c/o sob tonight  He almost fainted coming in tonight.  Dialysis pt  Supposed to be dialyzed yesterday ha been coughing up pink frothy sputum today.  Dialysis cath in his lt upper chest   Fistula in his rt arm not being used at present

## 2018-08-06 NOTE — H&P (Signed)
PULMONARY / CRITICAL CARE MEDICINE   Name: [name] MRN: [id number] DOB: 06/06/1956    ADMISSION DATE:  2018-07-24 CONSULTATION DATE:  07-24-2018  REFERRING MD:  Dr. Randal Buba  CHIEF COMPLAINT:  Respiratory Distress   HISTORY OF PRESENT ILLNESS:   62 year old male with PMH of Systolic Heart Failure EF 20-25, S/P ICD/Defib, CAD, ESRD on HD MWF, Cirrhosis, HTN, HLD, DM, GERD, A.Fib on Coumadin  Presents to ED on 8/12 with increased shortness of breath. Family reports that patient goes to HD typically MWF however this week they went to beach and he went TTR and was suppose to go Saturday however missed that appointment. Reports that baseline systolic is 44-03 and is on Midodrine. Also has a chronic re-occurring right side pleural effusion with last thoracentesis on 8/2 with plans for pleurx catheter placement 8/13. Family reports since prolonged admission 07/2017 patient has been in very poor health requiring multiple procedures, hospital stays, and extensive help with daily care.   Upon arrival to ED patient is hypoxic with enlarged abdomen. CXR with increasing right pleural effusion with vascular congestion. Nephrology consulted with plans for HD this AM, however concerned that his BP will not tolerate with systolic currently 47-42 and would like to transfer to ICU for possible pressor support during HD. While in ED had extensive goals of care conversation with wife and patient. Plans to continue care with thoracentesis and HD in short term however patient would like to go home with hospice.   While in ICU, patient voiced that he was not wanting to undergo any more procedures and wanted his care to be focused on comfort. Wife, son, and daughter-in-law at bedside agree and want to uphold patient's decision to proceed with comfort care at this time.   PAST MEDICAL HISTORY :  He  has a past medical history of Adenocarcinoma of rectum ([place]), AICD (automatic cardioverter/defibrillator) present  (2002), Anemia, CAD (coronary artery disease), CHF (congestive heart failure) ([place]), Cholelithiasis (06/2015), Chronic kidney disease, stage IV (severe) ([place]), Chronic systolic heart failure ([place]), Cirrhosis ([place]), Colostomy in place [facility]), Dizziness, DM type 2, uncontrolled, with renal complications ([place]), Dysrhythmia, Essential hypertension, benign, GERD (gastroesophageal reflux disease), HCAP (healthcare-associated pneumonia) (07/21/2015), Hematuria, History of blood transfusion, HLD (hyperlipidemia), Ischemic cardiomyopathy, Myocardial infarction ([place]) (2001), Obesity, Orthostatic hypotension, OSA on CPAP, Paroxysmal atrial fibrillation ([facility]), PONV (postoperative nausea and vomiting), Presence of permanent cardiac pacemaker, Prostate cancer ([place]), SAH (subarachnoid hemorrhage) ([place]), and TIA (transient ischemic attack).  PAST SURGICAL HISTORY: He  has a past surgical history that includes Cardiac defibrillator placement (2002); Abdominal and Perineal Resection of Rectum with Total Mesorectal Excision (10/04/2007); Colonoscopy (09/14/2011); Colostomy (2008); Colonoscopy (N/A, 07/02/2014); Esophagogastroduodenoscopy (N/A, 07/02/2014); Savory dilation (N/A, 07/02/2014); maloney dilation (N/A, 07/02/2014); Portacath placement (06/2007); left heart catheterization with coronary angiogram (N/A, 07/13/2013); Colonoscopy (N/A, 12/11/2014); Esophagogastroduodenoscopy (N/A, 12/11/2014); Cardiac defibrillator placement (2009); right heart catheterization (N/A, 02/24/2015); Cardiac catheterization (N/A, 04/10/2015); Bi-ventricular implantable cardioverter defibrillator  (crt-d) (2009); Cardiac catheterization (08/2001; 2009); Coronary angioplasty with stent (2001; ~ 2006); Givens capsule study (N/A, 07/23/2015); Colonoscopy (N/A, 08/24/2015); Cardiac catheterization (N/A, 08/28/2015); AV fistula placement (Right, 09/16/2015); ERCP (N/A, 06/11/2016); ERCP (N/A, 01/26/2017); Gastrointestinal stent removal (N/A, 01/26/2017); ERCP (06/2016); ERCP  (01/2017); Esophagogastroduodenoscopy (egd) with propofol (N/A, 04/18/2017); maloney dilation (N/A, 04/18/2017); IR US Guide Vasc Access Right (07/27/2017); IR Fluoro Guide CV Line Right (07/27/2017); IR Removal Tun Cv Cath W/O FL (08/12/2017); IR THORACENTESIS ASP PLEURAL SPACE W/IMG GUIDE (11/07/2017); A/V Fistulagram (Right, 05/04/2018); Insertion of dialysis catheter (Right,  05/04/2018); IR THORACENTESIS ASP PLEURAL SPACE W/IMG GUIDE (05/30/2018); and IR THORACENTESIS ASP PLEURAL SPACE W/IMG GUIDE (07/07/2018).  No Known Allergies  No current facility-administered medications on file prior to encounter.    Current Outpatient Medications on File Prior to Encounter  Medication Sig  . ALPRAZolam (XANAX) 0.5 MG tablet Take 0.5 mg by mouth 3 (three) times daily as needed.  Marland Kitchen atorvastatin (LIPITOR) 10 MG tablet Take 1 tablet (10 mg total) by mouth daily.  . cephALEXin (KEFLEX) 250 MG capsule Take 250 capsules by mouth 2 (two) times daily. Staph infection  . fexofenadine (ALLEGRA) 180 MG tablet Take 180 mg by mouth daily.  . fluticasone (FLONASE) 50 MCG/ACT nasal spray Place 2 sprays into both nostrils daily.  Marland Kitchen levothyroxine (SYNTHROID, LEVOTHROID) 75 MCG tablet Take 75 mcg by mouth daily before breakfast.  . midodrine (PROAMATINE) 5 MG tablet TAKE 3 TABLETS (15 MG TOTAL) BY MOUTH 2 (TWO) TIMES DAILY WITH A MEAL.  . nitroGLYCERIN (NITROSTAT) 0.4 MG SL tablet Place 1 tablet (0.4 mg total) under the tongue every 5 (five) minutes as needed for chest pain.  . Oxycodone HCl 10 MG TABS Take 10 mg by mouth every 4 (four) hours as needed. For pain  . pantoprazole (PROTONIX) 40 MG tablet Take 1 tablet (40 mg total) by mouth daily.  Marland Kitchen PROAIR RESPICLICK 696 (90 BASE) MCG/ACT AEPB Inhale 1 puff into the lungs every 6 (six) hours as needed (for breathing).   . sevelamer carbonate (RENVELA) 800 MG tablet Take 800-1,600 mg by mouth See admin instructions. 2 tablets with meals and 1 tablet with snacks  . warfarin (COUMADIN) 1  MG tablet TAKE 1 TABLET BY MOUTH EVERY DAY (Patient taking differently: Take 1 mg by mouth every evening. )  . aspirin EC 81 MG tablet Take 1 tablet (81 mg total) by mouth daily. (Patient not taking: Reported on 07/22/2018)  . glucose blood test strip Use to test blood sugar 3 times daily  . sucroferric oxyhydroxide (VELPHORO) 500 MG chewable tablet Chew 1 tablet (500 mg total) by mouth 3 (three) times daily with meals. (Patient not taking: Reported on 22-Jul-2018)    FAMILY HISTORY:  His family history includes Colon cancer in his other; Colon cancer (age of onset: 59) in his sister; Colon cancer (age of onset: 62) in his mother; Coronary artery disease in his father; Diabetes in his sister.  SOCIAL HISTORY: He  reports that he has never smoked. He has never used smokeless tobacco. He reports that he does not drink alcohol or use drugs.  REVIEW OF SYSTEMS:   All negative; except for those that are bolded, which indicate positives.  Constitutional: weight loss, weight gain, night sweats, fevers, chills, fatigue, weakness.  HEENT: headaches, sore throat, sneezing, nasal congestion, post nasal drip, difficulty swallowing, tooth/dental problems, visual complaints, visual changes, ear aches. Neuro: difficulty with speech, weakness, numbness, ataxia. CV:  chest pain, orthopnea, PND, swelling in lower extremities, dizziness, palpitations, syncope.  Resp: cough, hemoptysis, dyspnea, wheezing. GI: heartburn, indigestion, abdominal pain, nausea, vomiting, diarrhea, constipation, change in bowel habits, loss of appetite, hematemesis, melena, hematochezia.  GU: dysuria, change in color of urine, urgency or frequency, flank pain, hematuria. MSK: joint pain or swelling, decreased range of motion. Psych: change in mood or affect, depression, anxiety, suicidal ideations, homicidal ideations. Skin: rash, itching, bruising.   SUBJECTIVE:   VITAL SIGNS: BP (!) 83/48   Pulse 74   Temp 97.6 F (36.4 C)  (Oral)   Resp (!) 26  Ht 5\' 10"  (1.778 m)   Wt 78.8 kg   SpO2 94%   BMI 24.93 kg/m   HEMODYNAMICS:    VENTILATOR SETTINGS: FiO2 (%):  [80 %] 80 %  INTAKE / OUTPUT: No intake/output data recorded.  PHYSICAL EXAMINATION: General:  Elderly male, respiratory distress  Neuro:  Lethargic, follows commands, answers questions appropriately  HEENT:  Dry MM  Cardiovascular:  Irregular, rate controlled, no MRG Lungs:  Rhonchi, cardiac wheeze, crackles to bases  Abdomen:  Distended, non-tender  Musculoskeletal:  +2 BLE Skin:  Warm, dry  LABS:  BMET Recent Labs  Lab 2018-08-14 0114 2018-08-14 0226  NA 134* 131*  K 5.2* 5.7*  CL 99 105  CO2 17*  --   BUN 65* 85*  CREATININE 4.09* 3.90*  GLUCOSE 64* 61*    Electrolytes Recent Labs  Lab 08/14/18 0114  CALCIUM 9.1    CBC Recent Labs  Lab 08-14-2018 0114 08/14/18 0226  WBC 23.5*  --   HGB 10.9* 12.6*  HCT 34.3* 37.0*  PLT 82*  --     Coag's Recent Labs  Lab 08/14/2018 0211  APTT 54*  INR 3.83    Sepsis Markers Recent Labs  Lab Aug 14, 2018 0226  LATICACIDVEN 4.31*    ABG No results for input(s): PHART, PCO2ART, PO2ART in the last 168 hours.  Liver Enzymes Recent Labs  Lab 14-Aug-2018 0238  AST 61*  ALT 21  ALKPHOS 253*  BILITOT 5.2*  ALBUMIN 2.4*    Cardiac Enzymes Recent Labs  Lab 08/14/18 0114  TROPONINI 0.05*    Glucose Recent Labs  Lab 08-14-2018 0238 08-14-18 0351  GLUCAP 60* 107*    Imaging Dg Chest Portable 1 View  Result Date: 2018/08/14 CLINICAL DATA:  Shortness of breath EXAM: PORTABLE CHEST 1 VIEW COMPARISON:  07/07/2018 FINDINGS: Cardiac shadow is stable. Defibrillator and dialysis catheter are again seen and stable. Right-sided pleural effusion is noted and slightly increased. Increasing right basilar infiltrate is seen when compare with the prior exam. Left lung is clear. No bony abnormality is noted. IMPRESSION: Slight increase in right-sided pleural effusion and right basilar  infiltrate. Electronically Signed   By: Inez Catalina M.D.   On: 2018/08/14 01:34   Dg Abd Portable 1 View  Result Date: Aug 14, 2018 CLINICAL DATA:  Abdominal distention EXAM: PORTABLE ABDOMEN - 1 VIEW COMPARISON:  CT abdomen and pelvis 07/10/2017 FINDINGS: Scattered gas and stool in the colon. No small or large bowel distention is identified. No radiopaque stones. Degenerative changes in the spine. Vascular calcifications. IMPRESSION: Nonobstructive bowel gas pattern. Electronically Signed   By: Lucienne Capers M.D.   On: Aug 14, 2018 03:25     STUDIES:  CXR 8/12 >Cardiac shadow is stable. Defibrillator and dialysis catheter are again seen and stable. Right-sided pleural effusion is noted and slightly increased. Increasing right basilar infiltrate is seen when compare with the prior exam. Left lung is clear. No bony abnormality is noted.  ABD 8/12 > Scattered gas and stool in the colon. No small or large bowel distention is identified. No radiopaque stones. Degenerative changes in the spine. Vascular calcifications.  CULTURES: Blood 8/12 >>  ANTIBIOTICS: Vancomycin 8/12  Cefepime 8/12  SIGNIFICANT EVENTS: 8/12 > Presents to ED   LINES/TUBES: PIV  DISCUSSION: 62 year old male with overall very poor health and multiple co-morbidities presents to ED with acute hypoxic respiratory distress. Transferred to ICU for further care, however, patient then decided with family at bedside that he wished to be made a comfort care.  ASSESSMENT / PLAN:  Acute Hypoxic Respiratory Distress in setting of right side pleural effusion and pulmonary edema ESRD Cirrhosis  Systolic Heart Failure (EF 20-25)  CAD S/P ICD/Defib Chronic Hypotension  DM H/O Prostate Cancer  Plan  -Transition to Comfort Care -Start Morphine gtt -D/C BiPAP -Robinul PRN -Ativan PRN -Zofran PRN   CC Time: 88 minutes  Hayden Pedro, AGACNP-BC [facility]  Pgr: [phone]  PCCM Pgr:  [phone]

## 2018-08-06 NOTE — ED Provider Notes (Signed)
[facility] EMERGENCY DEPARTMENT Provider Note   CSN: [id number] Arrival date & time: 2018/08/14  0049     History   Chief Complaint Chief Complaint  Patient presents with  . Loss of Consciousness  . Shortness of Breath    HPI [name] is a 62 y.o. male.  Patient with complex PMH including CHF, ESRD on HD (MWF), but last week was TThSa, but missed Saturday due to being out of town, on coumadin,  presents to the emergency department with a chief complaint of shortness of breath and loss of consciousness.  Patient is accompanied by his spouse, who states that he became acutely worse this evening and passed out.  He was recently admitted for the same and had thoracentesis for large pleural effusion.  He is scheduled to have a pleural drain placed next week.  Wife denies patient having had fever, but states that he did have bloody/frothy cough today.  The history is provided by the patient. No language interpreter was used.    Past Medical History:  Diagnosis Date  . Adenocarcinoma of rectum (HCC)    a. 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 AICD (automatic cardioverter/defibrillator) present 2002   BI V ICD  . Anemia   . CAD (coronary artery disease)    a. BMS to LAD 2001 at [facility] b. PTCA/atherectomy ramus and BMS to LAD 2009  . CHF (congestive heart failure) (HCC)   . Cholelithiasis 06/2015  . Chronic kidney disease, stage IV (severe) (HCC)   . Chronic systolic heart failure (HCC)   . Cirrhosis (HCC)   . Colostomy in place [facility])   . Dizziness    a. chronic. Admission for this 07/18/2014  . DM type 2, uncontrolled, with renal complications (HCC)   . Dysrhythmia   . Essential hypertension, benign   . GERD (gastroesophageal reflux disease)   . HCAP (healthcare-associated pneumonia) 07/21/2015  . Hematuria   . History of blood transfusion    "I've had 2 units so far this year" (09/27/2015)  . HLD (hyperlipidemia)   . Ischemic cardiomyopathy    EF 18% by nuclear study 2016,  multiple myocardial infarctions in past    . Myocardial infarction (HCC) 2001  . Obesity   . Orthostatic hypotension   . OSA on CPAP   . Paroxysmal atrial fibrillation (HCC)    a. on amiodarone, digoxin and Eliquis  . PONV (postoperative nausea and vomiting)   . Presence of permanent cardiac pacemaker   . Prostate cancer (HCC)    a. s/p seed implants with chemo and radiation  . SAH (subarachnoid hemorrhage) (HCC)    post-traumatic (fall) [facility] 12/2014  . TIA (transient ischemic attack)     Patient Active Problem List   Diagnosis Date Noted  . Pressure injury of skin 06/28/2018  . Recurrent pleural effusion on right 06/27/2018  . Heart failure (HCC) 06/27/2018  . Malnutrition of moderate degree 09/06/2017  . Status post thoracentesis   . Colostomy in place [facility])   . ESRD on dialysis (HCC)   . Hyponatremia 09/03/2017  . Hemoptysis 09/03/2017  . Hypoglycemia   . Severe protein-calorie malnutrition (HCC)   . Acute blood loss anemia   . Type 2 diabetes mellitus with peripheral neuropathy (HCC)   . Chronic systolic heart failure (HCC)   . Colostomy care (HCC)   . Chronic kidney disease (CKD), stage IV (severe) (HCC)   . Dependence on renal dialysis (HCC)   . Wounds, multiple   . Supratherapeutic INR   .  MSSA bacteremia 08/02/2017  . ESRD (end stage renal disease) (HCC)   . Debility   . Acute on chronic systolic CHF (congestive heart failure) (HCC)   . Bacteremia due to methicillin susceptible Staphylococcus aureus (MSSA)   . Infected defibrillator (HCC)   . Septic hip (HCC)   . Staphylococcus aureus bacteremia with sepsis (HCC)   . Severe sepsis with septic shock (HCC)   . Cardiogenic shock (HCC) 07/10/2017  . Septic shock (HCC) 07/10/2017  . Cardiorenal syndrome with renal failure   . Thrombocytopenia (HCC) 06/14/2017  . GI bleed 04/27/2017  . Portal hypertensive gastropathy (HCC) 04/27/2017  . Hypokalemia 04/27/2017  . Esophageal dysphagia 04/13/2017  . Hepatic  cirrhosis (HCC) 02/24/2017  . Acute on chronic systolic heart failure (HCC)   . Biliary disease with obstruction   . AKI (acute kidney injury) (HCC)   . Abnormal LFTs 05/10/2016  . Pain in the chest   . Right heart failure (HCC) 09/26/2015  . History of colonic polyps 09/24/2015  . Chronic atrial fibrillation (HCC)   . Bleeding from colostomy stoma (HCC) 08/23/2015  . Anemia of chronic disease 07/21/2015  . HCAP (healthcare-associated pneumonia) 07/21/2015  . Constipation 05/01/2015  . Transaminitis 05/01/2015  . NSTEMI- 04/23/15- Myoview scar- no cath 04/27/2015  . Acute respiratory failure (HCC) 04/23/2015  . Chronic cholecystitis 04/04/2015  . Cystitis with hematuria-April 2016 04/04/2015  . Chronically elevated transaminase level-Amiodarone resumed 04/24/15 04/03/2015  . H/O ventricular fibrillation-March 2016 02/24/2015  . BiV ICD (St Jude) gen change 04/10/15 02/24/2015  . Hyperlipidemia   . Dietary noncompliance   . Orthostatic hypotension 07/19/2014  . OSA on CPAP   . GERD (gastroesophageal reflux disease) 06/05/2014  . Gallbladder polyp 03/06/2014  . DM type 2, uncontrolled, with renal complications (HCC) 01/27/2014  . Dizziness 01/26/2014  . Obesity 12/12/2013  . Chronic hypotension 12/11/2013  . Chronic kidney disease, stage IV (severe) (HCC) 12/11/2013  . Chronic a-fib (HCC)   . CAD with LCX disease and stents to LAD 07/10/2013  . Long term current use of anticoagulant therapy 03/23/2013  . History of rectal cancer   . Ischemic cardiomyopathy-EF 35% 04/24/15 echo     Past Surgical History:  Procedure Laterality Date  . A/V FISTULAGRAM Right 05/04/2018   Procedure: A/V FISTULAGRAM;  Surgeon: Fransisco Hertz, MD;  Location: [facility] INVASIVE CV LAB;  Service: Cardiovascular;  Laterality: Right;  . Abdominal and Perineal Resection of Rectum with Total Mesorectal Excision  10/04/2007  . AV FISTULA PLACEMENT Right 09/16/2015   Procedure: ARTERIOVENOUS (AV) FISTULA CREATION -  BRACHIOCEPHALIC;  Surgeon: Sherren Kerns, MD;  Location[facility];  Service: Vascular;  Laterality: Right;  . BI-VENTRICULAR IMPLANTABLE CARDIOVERTER DEFIBRILLATOR  (CRT-D)  2009  . CARDIAC CATHETERIZATION  08/2001; 2009   ; Hattie Perch 07/10/2013  . CARDIAC DEFIBRILLATOR PLACEMENT  2002  . CARDIAC DEFIBRILLATOR PLACEMENT  2009   Upgraded to a BiV ICD  . COLONOSCOPY  09/14/2011   Dr. Jena Gauss: via colostomy, Single pedunculated benign inflammatory polyp. Due for surveillance Oct 2015  . COLONOSCOPY N/A 07/02/2014   Procedure: COLONOSCOPY;  Surgeon: Corbin Ade, MD;  Location[facility] ENDO SUITE;  Service: Endoscopy;  Laterality: N/A;  7:30 / COLONOSCOPY THRU COLOSTOMY  . COLONOSCOPY N/A 12/11/2014   Dr. Jena Gauss via colostomy. Normal. Repeat in 2021.   Marland Kitchen COLONOSCOPY N/A 08/24/2015   Dr. Adela Lank: diminutive polyp not removed, stoma site of bleeding  . COLOSTOMY  2008  . CORONARY ANGIOPLASTY WITH STENT PLACEMENT  2001; ~  2006   "1 + 1"   . ELECTROPHYSIOLOGIC STUDY N/A 08/28/2015   Procedure: AV Node Ablation;  Surgeon: Will Jorja Loa, MD;  Location: [facility] INVASIVE CV LAB;  Service: Cardiovascular;  Laterality: N/A;  . EP IMPLANTABLE DEVICE N/A 04/10/2015   Procedure: Ppm/Biv Ppm Generator Changeout;  Surgeon: Marinus Maw, MD;  Location: [facility] INVASIVE CV LAB CUPID;  Service: Cardiovascular;  Laterality: N/A;  . ERCP N/A 06/11/2016   Procedure: ENDOSCOPIC RETROGRADE CHOLANGIOPANCREATOGRAPHY (ERCP);  Surgeon: Dorena Cookey, MD;  Location: [facility] ENDOSCOPY;  Service: Endoscopy;  Laterality: N/A;  . ERCP N/A 01/26/2017   Procedure: ENDOSCOPIC RETROGRADE CHOLANGIOPANCREATOGRAPHY (ERCP);  Surgeon: Dorena Cookey, MD;  Location: [facility] ENDOSCOPY;  Service: Endoscopy;  Laterality: N/A;  . ERCP  06/2016   Dr. Madilyn Fireman: duodenal fistula, dilated bile duct but no obvious stones, s/p sphincterotomy and stent placement  . ERCP  01/2017   Dr. Madilyn Fireman: CBD stones, s/p sphincterotomy, balloon extraction, stent removal  .  ESOPHAGOGASTRODUODENOSCOPY N/A 07/02/2014   Procedure: ESOPHAGOGASTRODUODENOSCOPY (EGD);  Surgeon: Corbin Ade, MD;  [facility] END[facility];  Service: Endoscopy;  Laterality: N/A;  7:30  . ESOPHAGOGASTRODUODENOSCOPY N/A 12/11/2014   [id number] EGD  . ESOPHAGOGASTRODUODENOSCOPY (EGD) WITH PROPOFOL N/A 04/18/2017   Procedure: ESOPHAGOGASTRODUODENOSCOPY (EGD) WITH PROPOFOL;  Surgeon: Corbin Ade, MD;  [facility] END[facility];  Service: Endoscopy;  Laterality: N/A;  2:30pm  . GASTROINTESTINAL STENT REMOVAL N/A 01/26/2017   Procedure: GASTROINTESTINAL STENT REMOVAL;  Surgeon: Dorena Cookey, MD;  Location: [facility] ENDOSCOPY;  Service: Endoscopy;  Laterality: N/A;  . GIVENS CAPSULE STUDY N/A 07/23/2015   Dr. Bosie Clos: small non-bleeding AVM, mild gastritis  . INSERTION OF DIALYSIS CATHETER Right 05/04/2018   Procedure: INSERTION OF DIALYSIS CATHETER;  Surgeon: Chuck Hint, MD;  Location: [facility] OR;  Service: Vascular;  Laterality: Right;  . IR FLUORO GUIDE CV LINE RIGHT  07/27/2017  . IR REMOVAL TUN CV CATH W/O FL  08/12/2017  . IR THORACENTESIS ASP PLEURAL SPACE W/IMG GUIDE  11/07/2017  . IR THORACENTESIS ASP PLEURAL SPACE W/IMG GUIDE  05/30/2018  . IR THORACENTESIS ASP PLEURAL SPACE W/IMG GUIDE  07/07/2018  . IR US GUIDE VASC ACCESS RIGHT  07/27/2017  . LEFT HEART CATHETERIZATION WITH CORONARY ANGIOGRAM N/A 07/13/2013   Procedure: LEFT HEART CATHETERIZATION WITH CORONARY ANGIOGRAM;  Surgeon: [name], MD;  Location: [facility] CATH LAB;  Service: Cardiovascular;  Laterality: N/A;  . Elease Hashimoto DILATION N/A 07/02/2014   Procedure: Elease Hashimoto DILATION;  Surgeon: Corbin Ade, MD;  [facility];  Service: Endoscopy;  Laterality: N/A;  7:30  . MALONEY DILATION N/A 04/18/2017   Procedure: Elease Hashimoto DILATION;  Surgeon: Corbin Ade, MD;  Location[facility] END[facility];  Service: Endoscopy;  Laterality: N/A;  . PORTACATH PLACEMENT  06/2007   "removed ~ 1 yr later"  . RIGHT HEART CATHETERIZATION N/A 02/24/2015    Procedure: RIGHT HEART CATH;  Surgeon: Dolores Patty, MD;  Location: [facility] CATH LAB;  Service: Cardiovascular;  Laterality: N/A;  . SAVORY DILATION N/A 07/02/2014   Procedure: SAVORY DILATION;  Surgeon: Corbin Ade, MD;  Location[facility] ENDO [facility];  Service: Endoscopy;  Laterality: N/A;  7:30        Home Medications    Prior to Admission medications   Medication Sig Start Date End Date Taking? Authorizing Provider  ALPRAZolam Prudy Feeler) 0.5 MG tablet Take 0.5 mg by mouth 3 (three) times daily as needed. 05/25/18  Yes [provider]  atorvastatin (LIPITOR) 10 MG tablet  Take 1 tablet (10 mg total) by mouth daily. 12/28/17 07/17/26 Yes Bensimhon, Bevelyn Buckles, MD  cephALEXin (KEFLEX) 250 MG capsule Take 250 capsules by mouth 2 (two) times daily. Staph infection 10/25/17  Yes [provider]  fexofenadine (ALLEGRA) 180 MG tablet Take 180 mg by mouth daily.   Yes [provider]  fluticasone (FLONASE) 50 MCG/ACT nasal spray Place 2 sprays into both nostrils daily. 06/22/18  Yes [provider]  levothyroxine (SYNTHROID, LEVOTHROID) 75 MCG tablet Take 75 mcg by mouth daily before breakfast.   Yes [provider]  midodrine (PROAMATINE) 5 MG tablet TAKE 3 TABLETS (15 MG TOTAL) BY MOUTH 2 (TWO) TIMES DAILY WITH A MEAL. 04/12/18  Yes Bensimhon, Bevelyn Buckles, MD  nitroGLYCERIN (NITROSTAT) 0.4 MG SL tablet Place 1 tablet (0.4 mg total) under the tongue every 5 (five) minutes as needed for chest pain. 08/19/17 03/31/19 Yes Angiulli, Mcarthur Rossetti, PA-C  Oxycodone HCl 10 MG TABS Take 10 mg by mouth every 4 (four) hours as needed. For pain 05/25/18  Yes [provider]  pantoprazole (PROTONIX) 40 MG tablet Take 1 tablet (40 mg total) by mouth daily. 08/19/17  Yes Angiulli, Mcarthur Rossetti, PA-C  PROAIR RESPICLICK 108 (90 BASE) MCG/ACT AEPB Inhale 1 puff into the lungs every 6 (six) hours as needed (for breathing).  08/15/15  Yes [provider]  sevelamer carbonate (RENVELA)  800 MG tablet Take 800-1,600 mg by mouth See admin instructions. 2 tablets with meals and 1 tablet with snacks 06/25/18  Yes [provider]  warfarin (COUMADIN) 1 MG tablet TAKE 1 TABLET BY MOUTH EVERY DAY Patient taking differently: Take 1 mg by mouth every evening.  04/24/18  Yes Bensimhon, Bevelyn Buckles, MD  aspirin EC 81 MG tablet Take 1 tablet (81 mg total) by mouth daily. Patient not taking: Reported on 30-Jul-2018 05/04/18 05/04/19  Fransisco Hertz, MD  glucose blood test strip Use to test blood sugar 3 times daily 08/19/17   Angiulli, Mcarthur Rossetti, PA-C  sucroferric oxyhydroxide (VELPHORO) 500 MG chewable tablet Chew 1 tablet (500 mg total) by mouth 3 (three) times daily with meals. Patient not taking: Reported on 07-30-2018 08/19/17   Angiulli, Mcarthur Rossetti, PA-C    Family History Family History  Problem Relation Age of Onset  . Colon cancer Mother 10  . Coronary artery disease Father   . Colon cancer Sister 3  . Diabetes Sister   . Colon cancer Other        2 cousins, succumbed to illness    Social History Social History   Tobacco Use  . Smoking status: Never Smoker  . Smokeless tobacco: Never Used  Substance Use Topics  . Alcohol use: No    Alcohol/week: 0.0 standard drinks    Comment: Former user 45 years ago  . Drug use: No     Allergies   Patient has no known allergies.   Review of Systems Review of Systems  All other systems reviewed and are negative.    Physical Exam Updated Vital Signs BP (!) 102/50   Pulse 75   Temp 97.7 F (36.5 C) (Oral)   Resp (!) 21   Ht 5\' 10"  (1.778 m)   Wt 79.4 kg   SpO2 90%   BMI 25.11 kg/m   Physical Exam  Constitutional: He appears well-developed and well-nourished.  HENT:  Head: Normocephalic and atraumatic.  Eyes: Pupils are equal, round, and reactive to light. Conjunctivae and EOM are normal. Right eye exhibits no  discharge. Left eye exhibits no discharge. No scleral icterus.  Neck: Normal range of motion. Neck supple.  No JVD present.  Cardiovascular: Normal rate, regular rhythm and normal heart sounds. Exam reveals no gallop and no friction rub.  No murmur heard. Pulmonary/Chest: He is in respiratory distress. He has no wheezes. He has rales. He exhibits no tenderness.  Abdominal: Soft. He exhibits no distension and no mass. There is no tenderness. There is no rebound and no guarding.  colostomy  Musculoskeletal: Normal range of motion. He exhibits no edema or tenderness.  Neurological: He is alert.  Skin: Skin is warm and dry.  Psychiatric: He has a normal mood and affect. His behavior is normal. Judgment and thought content normal.  Nursing note and vitals reviewed.    ED Treatments / Results  Labs (all labs ordered are listed, but only abnormal results are displayed) Labs Reviewed  BASIC METABOLIC PANEL  CBC  TROPONIN I  BRAIN NATRIURETIC PEPTIDE  PROTIME-INR  APTT  I-STAT TROPONIN, ED  I-STAT CHEM 8, ED    EKG EKG Interpretation  Date/Time:  Monday 07-20-18 01:12:29 EDT Ventricular Rate:  77 PR Interval:    QRS Duration: 148 QT Interval:  437 QTC Calculation: 495 R Axis:   175 Text Interpretation:  Atrial fibrillation Ventricular premature complex Right bundle branch block Anterolateral infarct, age indeterminate Confirmed by Palumbo, April ([id number]) on 07/20/18 1:59:43 AM   Radiology Dg Chest Portable 1 View  Result Date: 07/20/18 CLINICAL DATA:  Shortness of breath EXAM: PORTABLE CHEST 1 VIEW COMPARISON:  07/07/2018 FINDINGS: Cardiac shadow is stable. Defibrillator and dialysis catheter are again seen and stable. Right-sided pleural effusion is noted and slightly increased. Increasing right basilar infiltrate is seen when compare with the prior exam. Left lung is clear. No bony abnormality is noted. IMPRESSION: Slight increase in right-sided pleural effusion and right basilar infiltrate. Electronically Signed   By: Alcide Clever M.D.   On: Jul 20, 2018 01:34     Procedures Procedures (including critical care time) CRITICAL CARE Performed by: Roxy Horseman Critically ill patient, hypotensive, respiratory distress, multiple interventions and consultants needed to stabilize patient.  Total critical care time: 56 minutes  Critical care time was exclusive of separately billable procedures and treating other patients.  Critical care was necessary to treat or prevent imminent or life-threatening deterioration.  Critical care was time spent personally by me on the following activities: development of treatment plan with patient and/or surrogate as well as nursing, discussions with consultants, evaluation of patient's response to treatment, examination of patient, obtaining history from patient or surrogate, ordering and performing treatments and interventions, ordering and review of laboratory studies, ordering and review of radiographic studies, pulse oximetry and re-evaluation of patient's condition.  Medications Ordered in ED Medications  naloxone (NARCAN) injection 0.4 mg (has no administration in time range)  furosemide (LASIX) injection 80 mg (has no administration in time range)     Initial Impression / Assessment and Plan / ED Course  I have reviewed the triage vital signs and the nursing notes.  Pertinent labs & imaging results that were available during my care of the patient were reviewed by me and considered in my medical decision making (see chart for details).    Patient with severe and reportedly sudden onset shortness of breath.  He was recently admitted for pleural effusion.  Chest x-ray performed in triage shows the same.  He is afebrile, but is noted to be hypotensive.  Prior charts reviewed show that  he generally runs in the 90s systolic.  He is on midodrine, this could be his baseline.  Patient noted to have leukocytosis to 23.  He also has elevated lactate of 4.31.  Chest x-ray shows possible opacity.  2:07 AM Discussed  with Dr. Nicanor Alcon, who recommends 80 mg Lasix, as the patient does make a small amount of urine daily.  Also recommends giving a dose of narcan.  2:25 AM Code sepsis activated.  NO fluids given due to increasing pleural effusions, low EF, defibrillator.  Patient missed dialysis, will likely need emergent dialysis.  I discussed case with Dr. Arlean Hopping, who will consult.  Also appreciate consultation with Dr. Arsenio Loader, who will have the rounding team see the patient. Final Clinical Impressions(s) / ED Diagnoses   Final diagnoses:  Acute respiratory failure, unspecified whether with hypoxia or hypercapnia [facility])    ED Discharge Orders    None       Roxy Horseman, PA-C 07/27/2018 0445    Palumbo, April, MD 07-27-18 [phone]

## 2018-08-06 NOTE — Progress Notes (Signed)
   2018/08/06 1000  Clinical Encounter Type  Visited With Family;Health care provider [facility])  Visit Type Death  Referral From Nurse  Consult/Referral To Chaplain   Responded to a [facility] for EOL.  [place] had met with the family last evening.  Upon arrival I learned of the patient's death.  Nurses had prepared the patient for family to come back.  Their Doristine Bosworth (one of our Financial risk analyst) was present.  He provided Spiritual Care and did not need anything at this time.  Staff had gotten necessary contact and funeral home information.  Will follow and support as needed Chaplain Katherene Ponto

## 2018-08-06 NOTE — Progress Notes (Signed)
Placed patient on BIPAP per MD order.

## 2018-08-06 NOTE — Progress Notes (Signed)
I spoke to patient regarding his goals of care. Robert Pedro NP and patient's wife were both present for the conversation. Discussed that patient has CHF and ESRD as well as many other chronic medical problems for which he is in and out of the hospital and receiving many procedures. Asked patient if he was okay with these admissions and procedures or if he would rather transition to home hospice where he would stay at home around family, receive pain medicines as needed, and have a hospice nurse coming to check on him. Explained that he would not receive dialysis on hospice and that the end result would be that he would die comfortably at home around his family. He said "that's what I want. I don't have quality of life now." Asked him if he wanted that now and he said "no lets wait the night." Plan at this time is for dialysis and possible thoracentesis, continuing BIPAP overnight, and palliative care consult in the AM.

## 2018-08-06 NOTE — ED Notes (Signed)
Respiratory notified of need for bipap.

## 2018-08-06 NOTE — Progress Notes (Signed)
   07/24/18 0620  Clinical Encounter Type  Visited With Patient and family together  Visit Type Spiritual support;Patient actively dying  Referral From Nurse   Chaplain had a long visit with Roma Schanz, Mr. Spurr's wife. She expressed admiration for Donta's faith through many health trials; understanding of why he would not want to continue to suffer; appreciation of what a good man, son, husband, dad and granddad he is; appropriate frustration with God over their circumstances. Chaplain then prayed with the patient, his wife and mom, and encouraged other family members as they arrived. Chaplain believes their pastor is among those coming this morning.

## 2018-08-06 NOTE — Progress Notes (Signed)
45cc IV Morphine wasted in sink. Witnessed by Mollie Germany, RN

## 2018-08-06 NOTE — ED Notes (Signed)
X-ray at bedside

## 2018-08-06 NOTE — Death Summary Note (Signed)
[name] was a 62 y.o. male who presented to the ER on 08-06-18 with shortness of breath and fainting.  He had cough with frothy sputum.  He had large right pleural effusion.  He missed his scheduled dialysis session.  Renal called to see him in ER and recommended transition to hospice care.  He was DNR.  Patient and family opted to transition to comfort measures.  He expired on August 06, 2018.   Final diagnoses: Acute hypoxic respiratory failure Right pleural effusion End stage renal disease on HD Liver cirrhosis with ascites Chronic systolic CHF S/p AICD Chronic atrial fibrillation on coumadin Stage 3 sacral decubitus ulcer present prior to admission History of rectal adenocarcinoma Diabetes mellitus type 2 History of prostate cancer Hyperkalemia Hyponatremia Lactic acidosis Anemia of critical illness and chronic disease Thrombocytopenia in setting of cirrhosis  Chesley Mires, MD [place] 07/25/2018, 1:29 PM

## 2018-08-06 DEATH — deceased

## 2018-09-19 ENCOUNTER — Ambulatory Visit: Payer: Medicare Other | Admitting: Internal Medicine

## 2018-09-21 ENCOUNTER — Ambulatory Visit: Payer: Medicare Other | Admitting: Internal Medicine

## 2018-10-23 IMAGING — DX DG CHEST 2V
2 series · 2 of 2 positions shown · non-contrast
Comparison: 06/09/2016

CLINICAL DATA: Fluid retention, no improvement with medication

EXAM:
CHEST  2 VIEW

[chest pa]
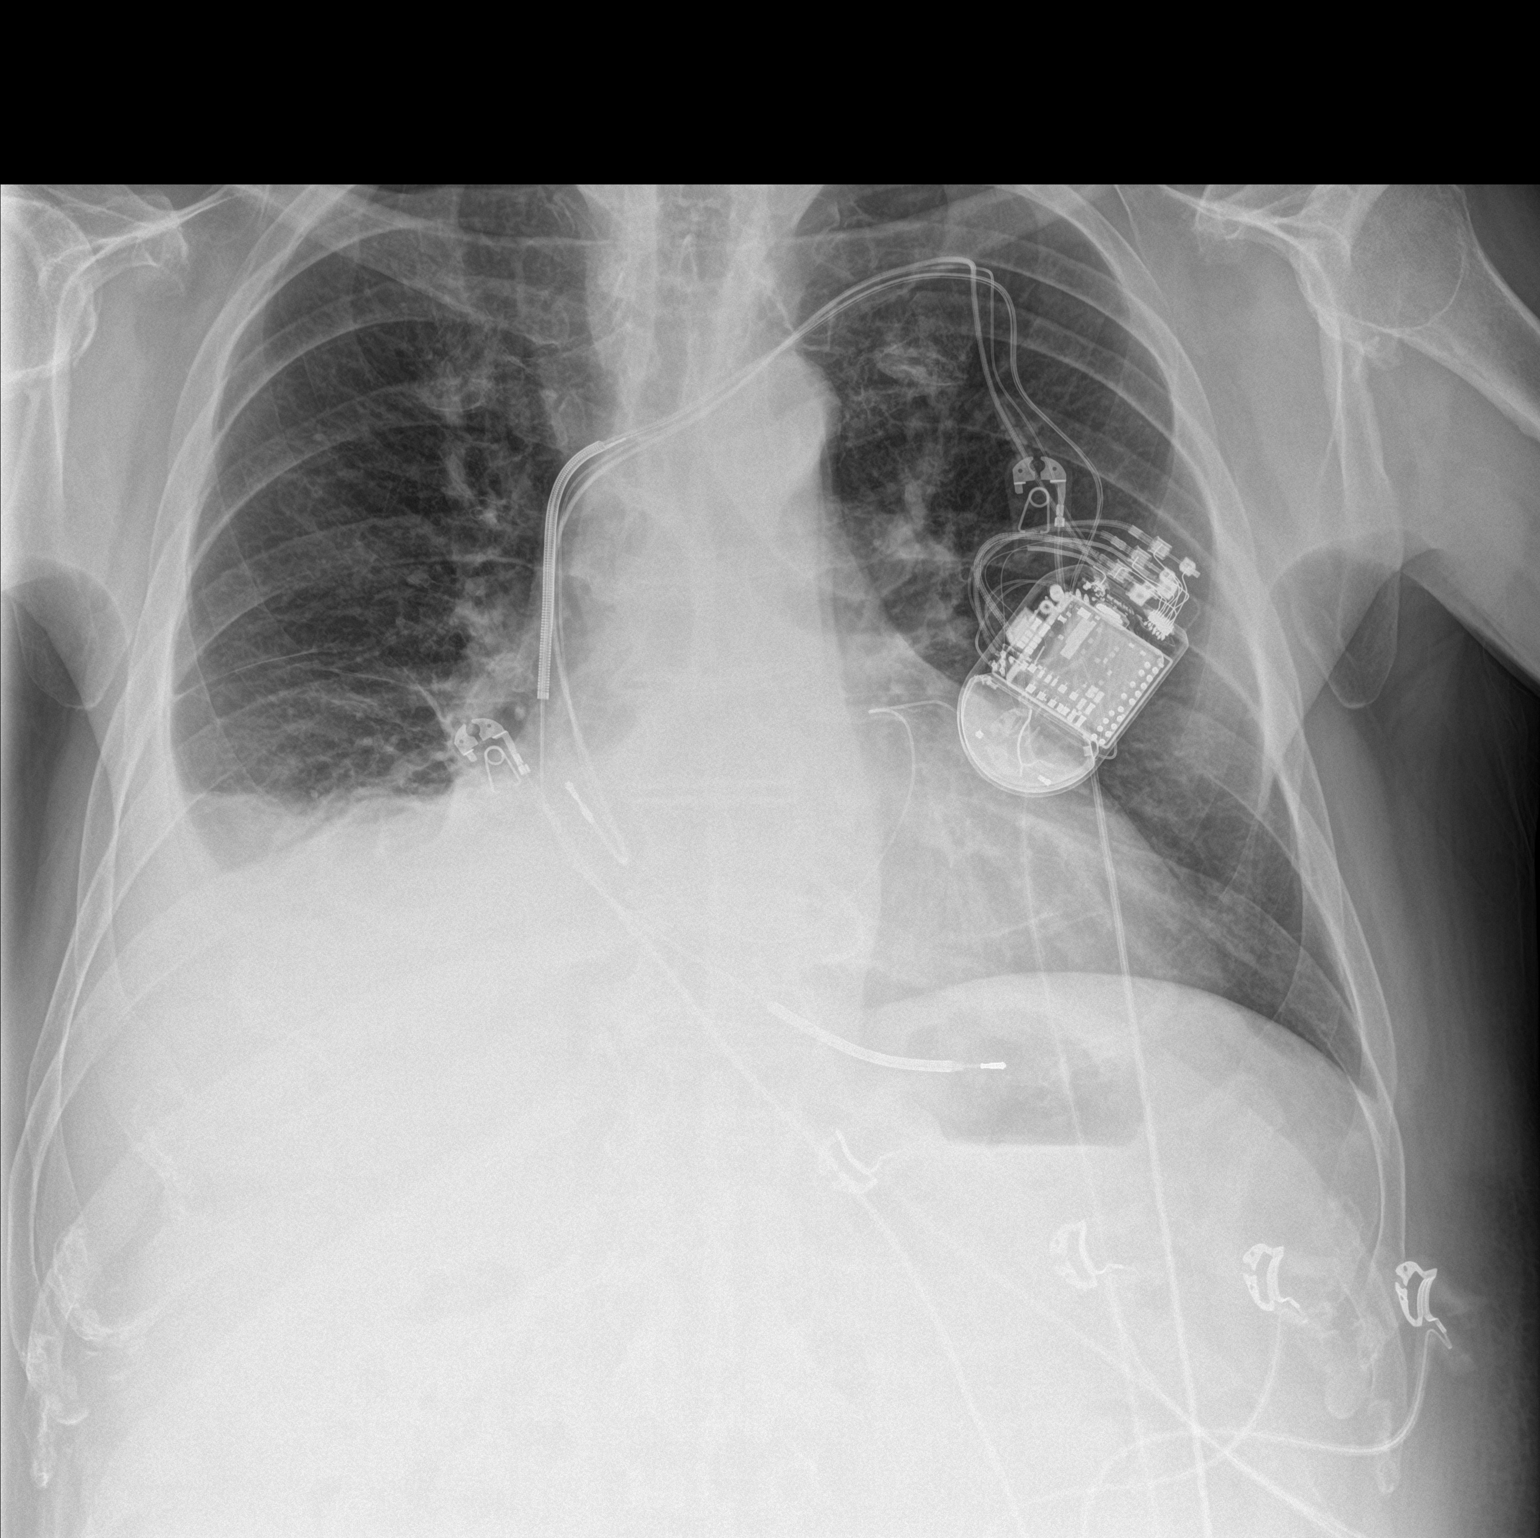

[chest lat]
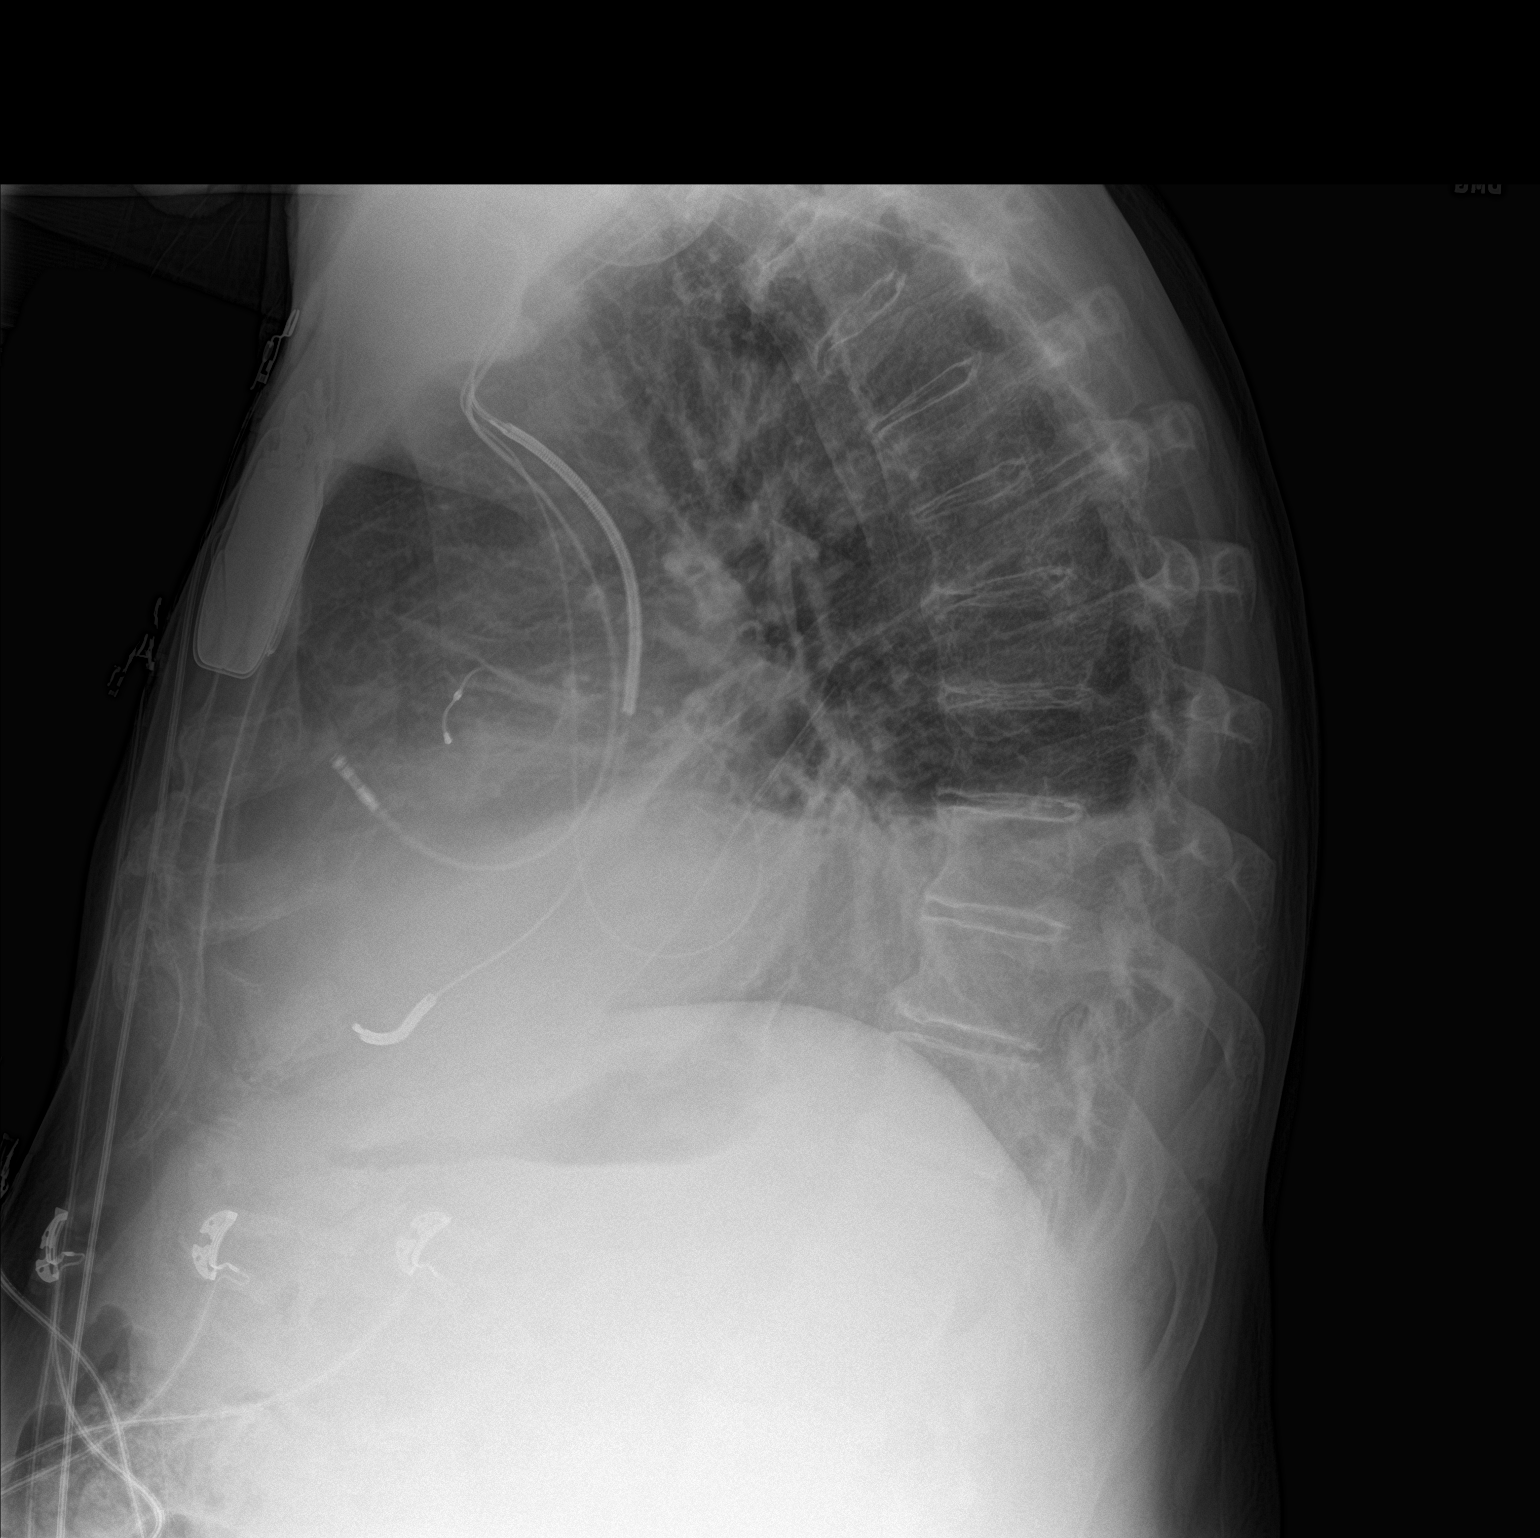

[2 of 2 positions shown; findings below may reference images not displayed]

FINDINGS: There is a small right pleural effusion with right basilar
atelectasis. There is mild bilateral interstitial thickening. There
is no left pleural effusion. There is no pneumothorax. There is mild
stable cardiomegaly. There is a 3 lead cardiac pacemaker.

The osseous structures are unremarkable.
IMPRESSION: 1. Small right pleural effusion.

## 2018-11-18 IMAGING — DX DG CHEST 1V PORT
1 series · 1 of 1 positions shown · non-contrast
Comparison: Chest radiograph dated 06/13/2017

CLINICAL DATA: 60-year-old male with shortness of breath.

EXAM:
PORTABLE CHEST 1 VIEW

[chest ap]
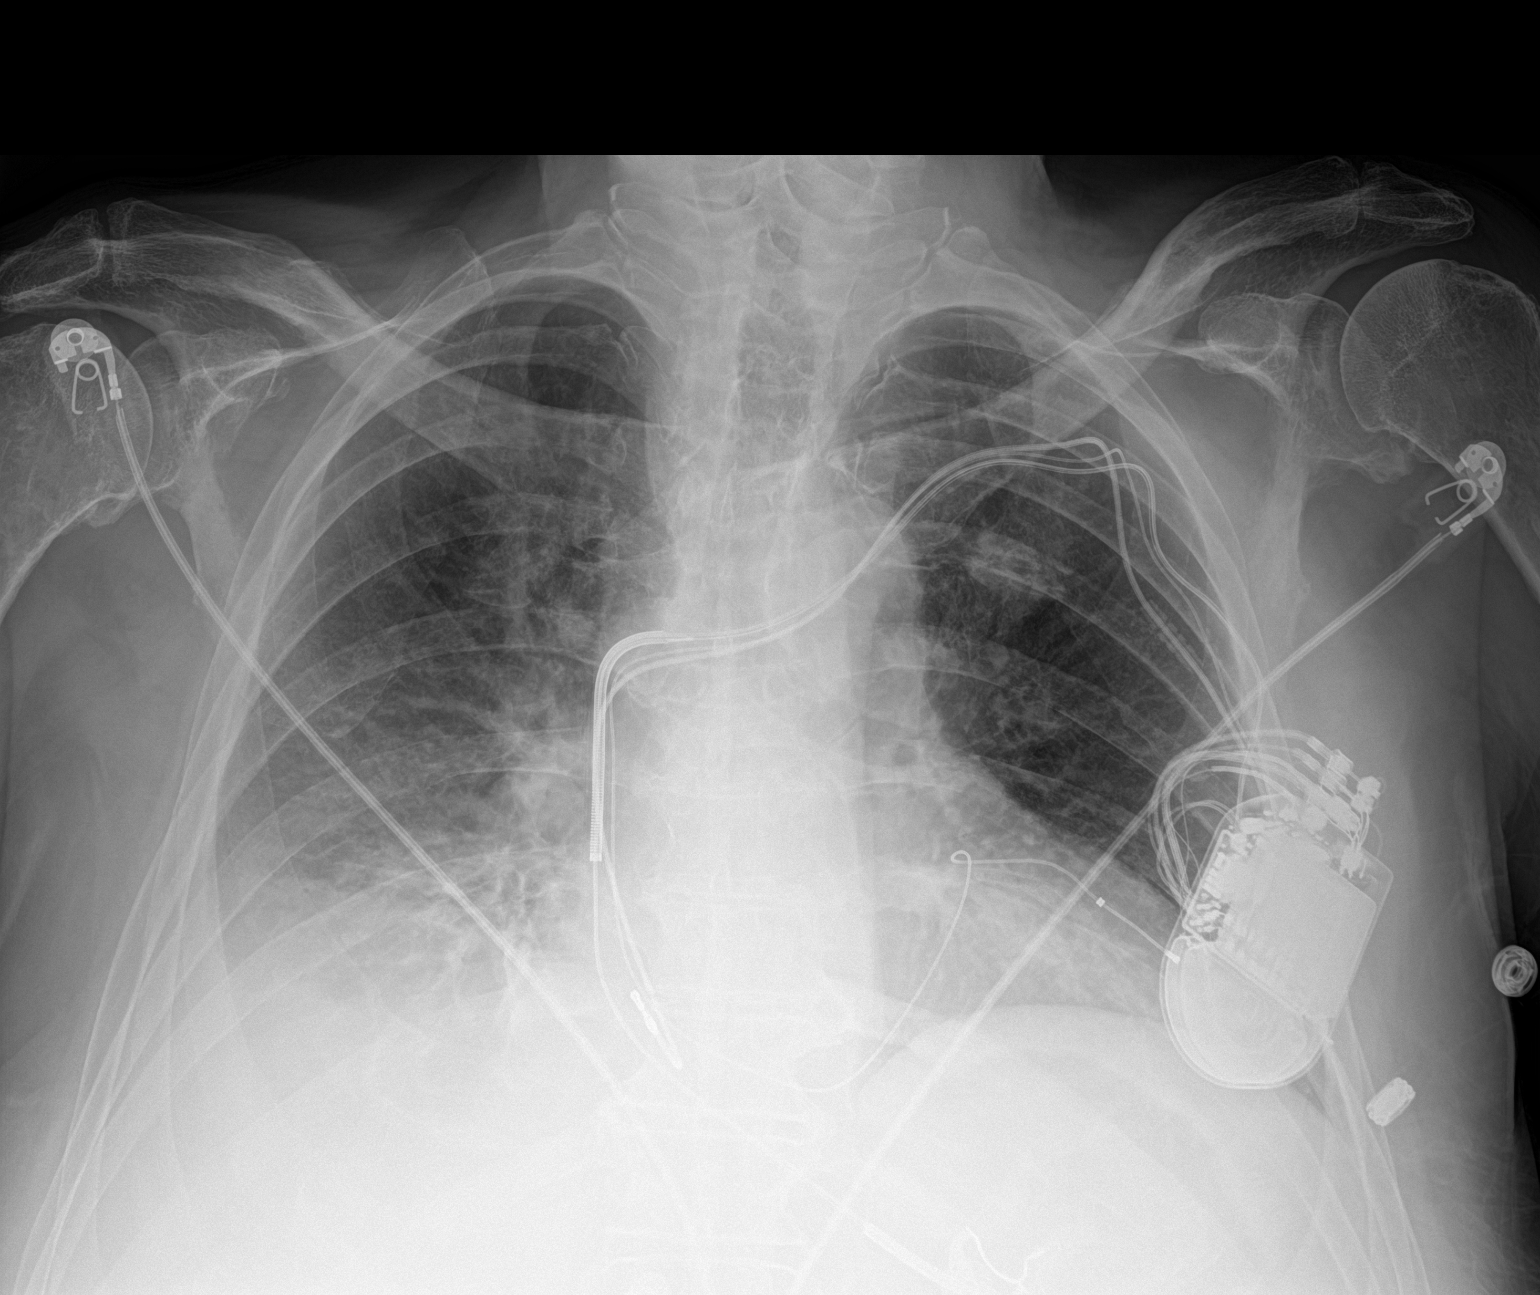

[1 of 1 positions shown; findings below may reference images not displayed]

FINDINGS: There is a small right pleural effusion with right lung base
airspace density which may represent atelectasis versus infiltrate.
Overall the size of the pleural effusion and right lung base density
appears relatively similar to prior radiograph. The left lung is
clear. There is no pneumothorax. Stable cardiac silhouette. Left
pectoral AICD device. No acute osseous pathology.
IMPRESSION: Right pleural effusion and right lung base atelectasis versus
infiltrate. Clinical correlation is recommended.

## 2018-11-19 IMAGING — DX DG CHEST 1V PORT
1 series · 1 of 1 positions shown · non-contrast
Comparison: 07/09/2017 and earlier.

CLINICAL DATA: 60-year-old male central line placement.

EXAM:
PORTABLE CHEST 1 VIEW

[chest]
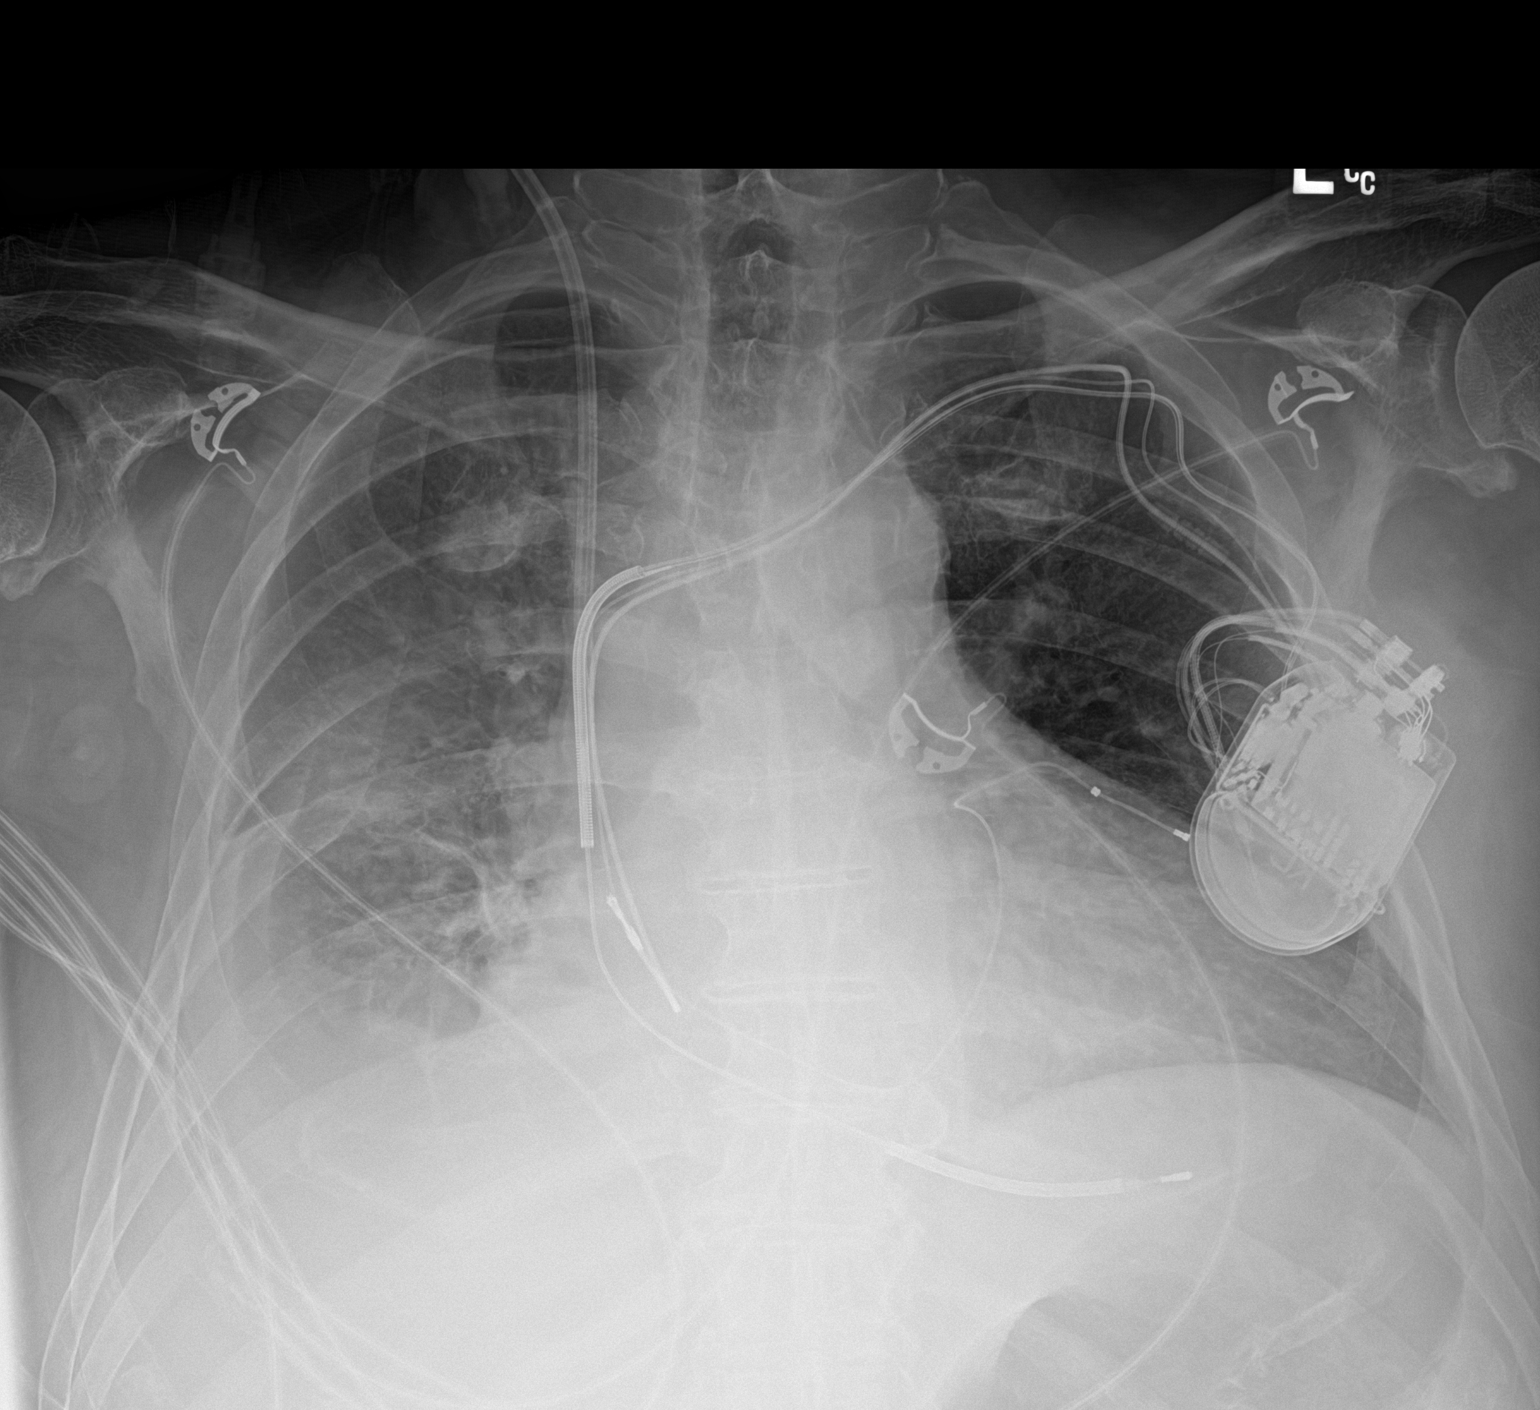

[1 of 1 positions shown; findings below may reference images not displayed]

FINDINGS: Portable AP semi upright view at [id number] hours. Right IJ approach dual
lumen central venous catheter placed, and courses along side the
left subclavian approach cardiac AICD leads. Catheter tips are
probably at the level of the carina.

No pneumothorax. Continued veiling opacity in the right lung. Stable
cardiac size and mediastinal contours. Visualized tracheal air
column is within normal limits. Stable and satisfactory left lung
ventilation.
IMPRESSION: 1. Dual-lumen right IJ central catheter tips partially obscured by
the AICD leads but at the SVC level.
2. Continued right pleural effusion. No new cardiopulmonary
abnormality.

## 2018-11-19 IMAGING — CT CT ABD-PELV W/O CM
2 of 4 series · 16 of 46 positions shown, 18 images · non-contrast
Comparison: CT abdomen and pelvis May 05, 2017

CLINICAL DATA: Abdominal pain. Swelling. Assess for diverticulitis,
hematoma or abscess. History of cirrhosis, diabetes, colostomy and
rectal cancer, prostate cancer, anemia.

EXAM:
CT ABDOMEN AND PELVIS WITHOUT CONTRAST
TECHNIQUE: Multidetector CT imaging of the abdomen and pelvis was performed
following the standard protocol without IV contrast.

[Series 3: a/p w/o 5mm · axial · non-contrast · 0.98mm/px · z∈[+915,+1390]mm · 13 of 103 slices shown, 15 images]
[im 4/103  soft-tissue]
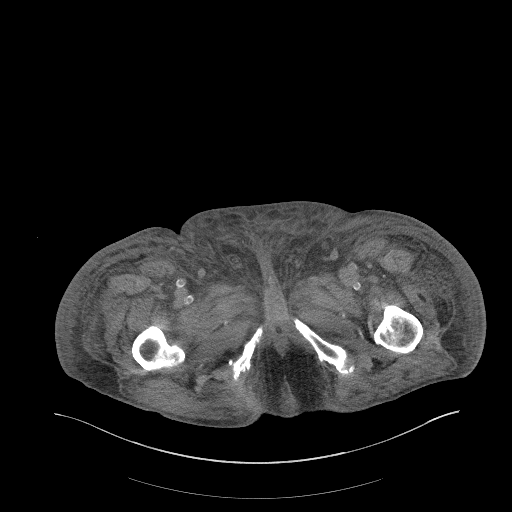
[im 4/103  bone]
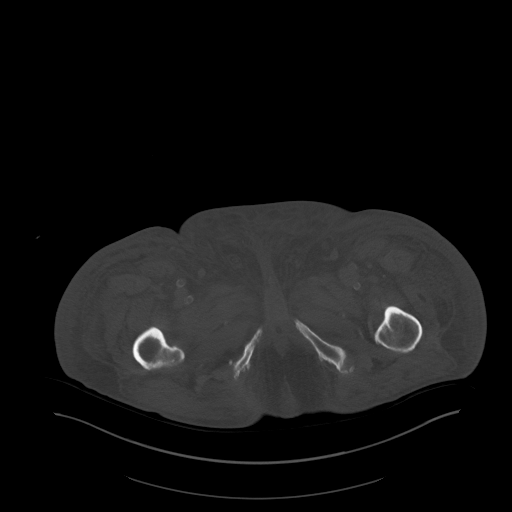
[im 12/103  soft-tissue]
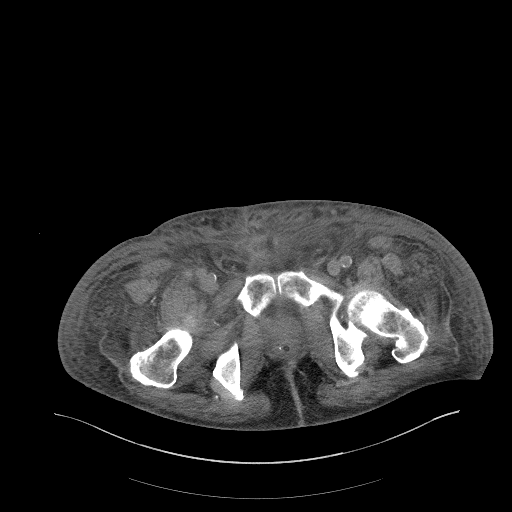
[im 20/103  soft-tissue]
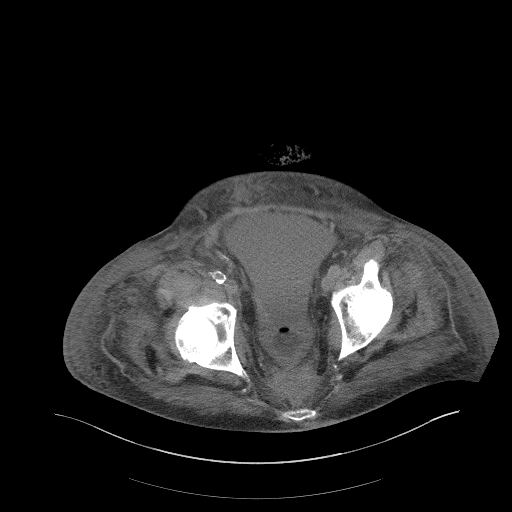
[im 28/103  soft-tissue]
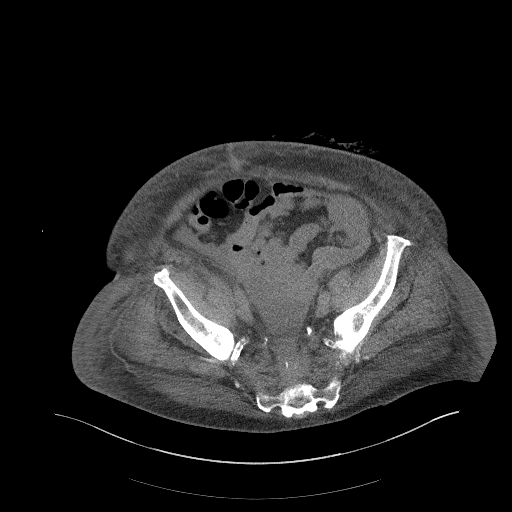
[im 36/103  soft-tissue]
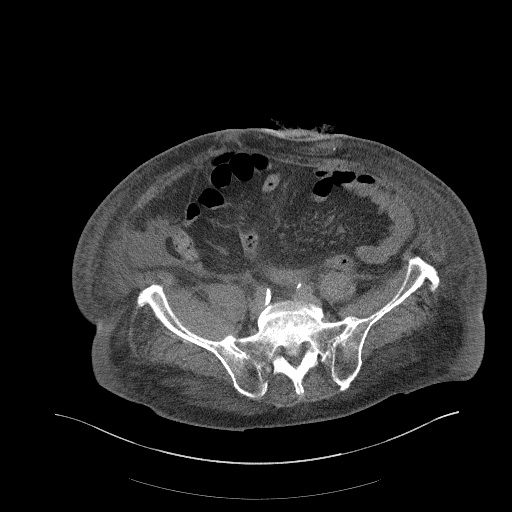
[im 44/103  soft-tissue]
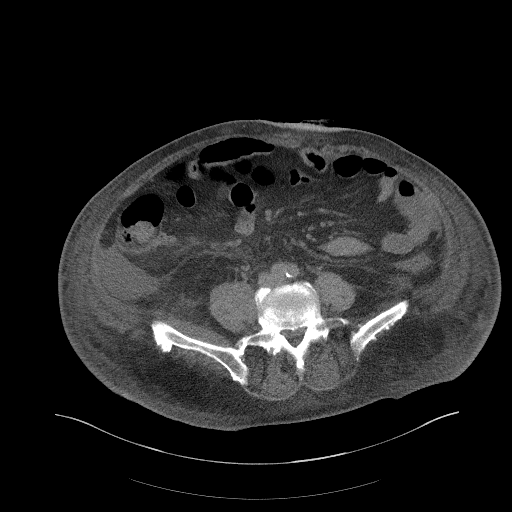
[im 52/103  soft-tissue]
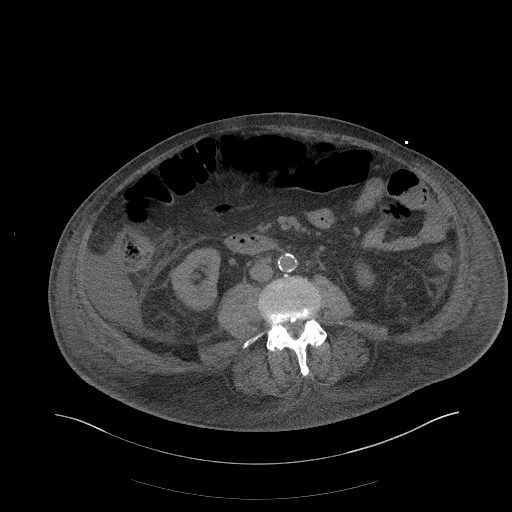
[im 59/103  soft-tissue]
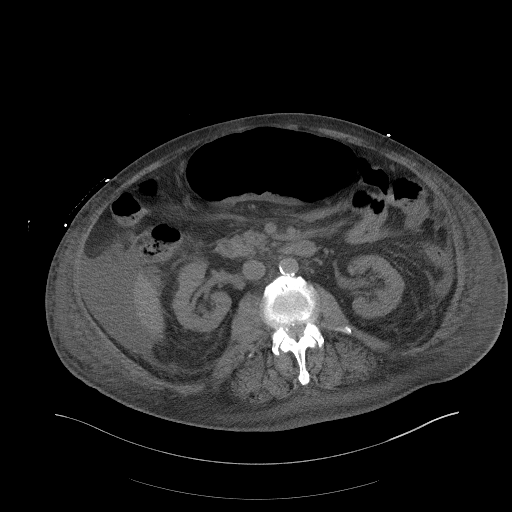
[im 67/103  soft-tissue]
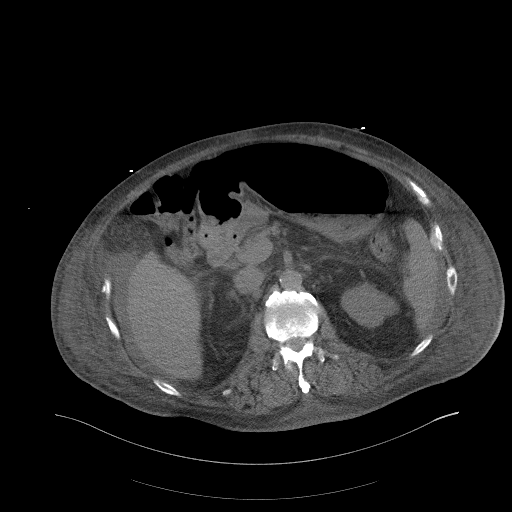
[im 67/103  bone]
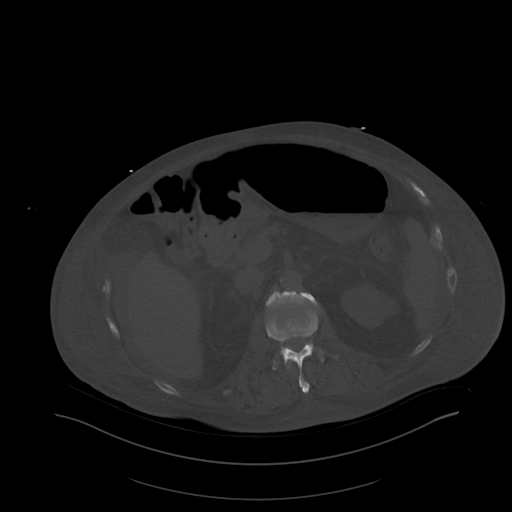
[im 75/103  soft-tissue]
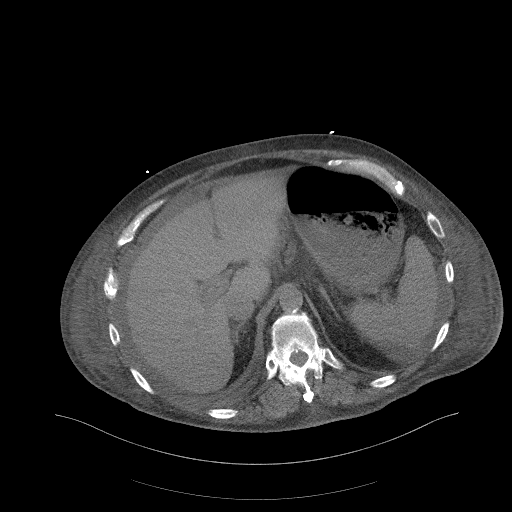
[im 83/103  soft-tissue]
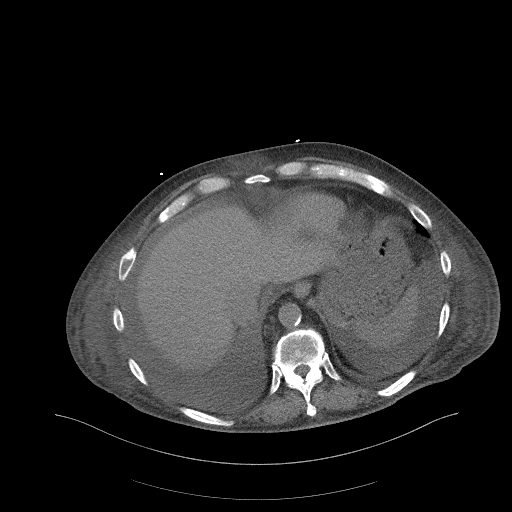
[im 91/103  soft-tissue]
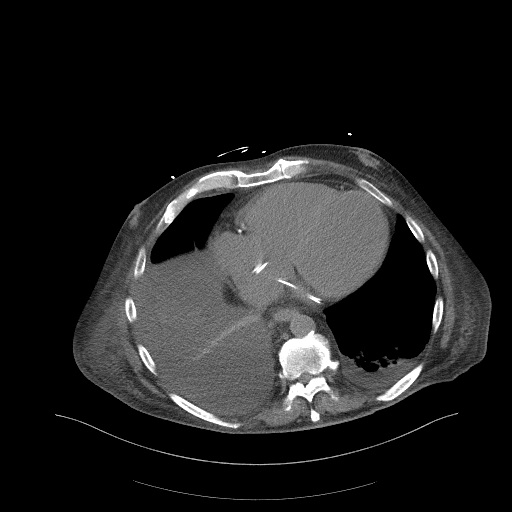
[im 99/103  soft-tissue]
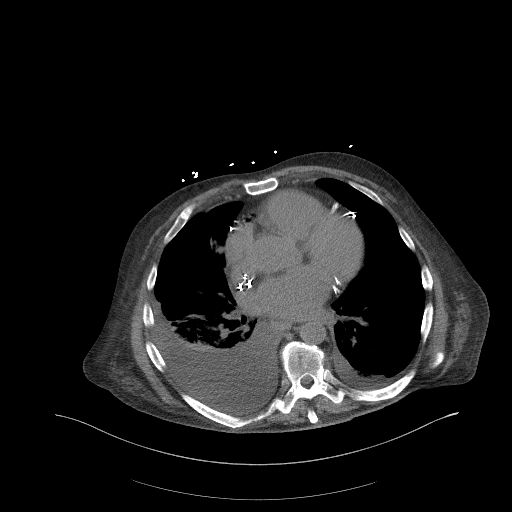

[Series 6: a/p w/o cor · coronal · non-contrast · 1.00mm/px · 3 of 161 slices shown]
[im 54/161  soft-tissue]
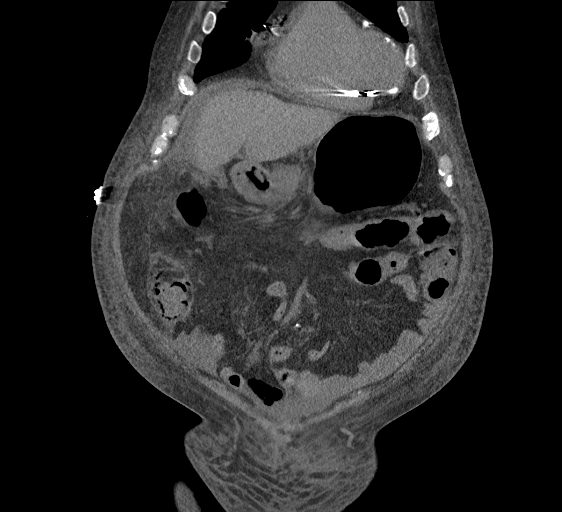
[im 72/161  soft-tissue]
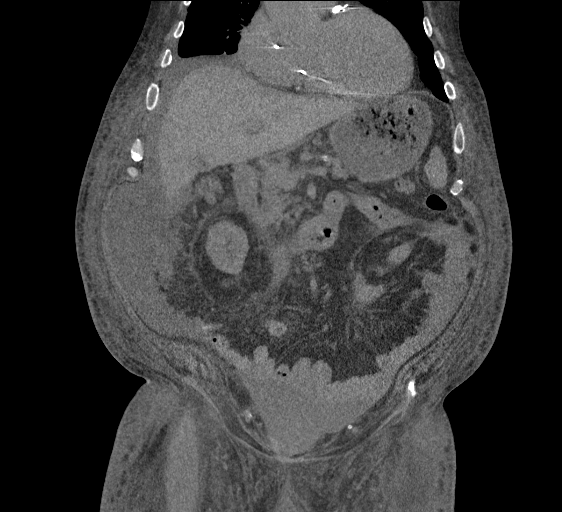
[im 89/161  soft-tissue]
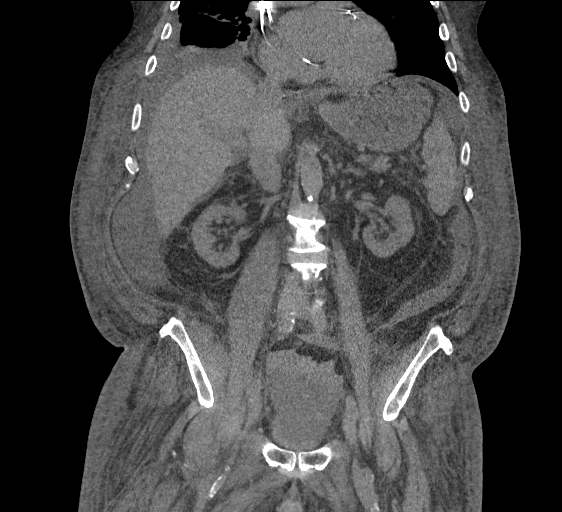

[16 of 46 positions shown; findings below may reference images not displayed]

FINDINGS: LOWER CHEST: Moderate RIGHT pleural effusion, small LEFT pleural
effusion. The heart is mildly enlarged. Severe coronary artery
calcifications and/or stents. Cardiac pacemaker wires in place.
Bilateral lower lobe atelectasis. Punctate granulomas RIGHT lower
lobe.

HEPATOBILIARY: Mildly nodular liver contour compatible with history
of cirrhosis. Subcentimeter gallstones, contracted gallbladder.

PANCREAS: Normal.

No intraperitoneal free air.

SPLEEN: Normal.

ADRENALS/URINARY TRACT: Kidneys are orthotopic, demonstrating normal
size and morphology. 3 mm LEFT upper pole, 3 mm RIGHT interpolar
nephrolithiasis. Additional punctate nephrolithiasis. No
hydronephrosis; limited assessment for renal masses on this
nonenhanced examination. The unopacified ureters are normal in
course and caliber. Urinary bladder is decompressed containing a
Foley catheter. Normal adrenal glands.

STOMACH/BOWEL: Similarly thickened rectum with presacral soft tissue
stranding. The stomach, small bowel are normal in course and caliber
without inflammatory changes, sensitivity decreased by lack of
enteric contrast. LEFT lower quadrant diverting colostomy. Normal
appendix.

VASCULAR/LYMPHATIC: Aortoiliac vessels are normal in course and
caliber. Moderate aortic atherosclerosis. No lymphadenopathy by CT
size criteria.

REPRODUCTIVE: Prostate brachia therapy seeds.

OTHER: Moderate volume ascites.

MUSCULOSKELETAL: Anasarca. Sacroiliac ankylosis. Anterior abdominal
wall scarring. Similar sacral sclerosis pre ring post radiation
change.
IMPRESSION: 1. Cirrhosis.  Moderate ascites.  Anasarca.
2. Diverting colostomy without bowel obstruction. Similar thickened
rectum compatible with treatment related changes.
3. Mild cardiomegaly. Moderate RIGHT and small LEFT pleural
effusions.
Aortic Atherosclerosis ([id number]-ONE.[id number]).

## 2018-11-19 IMAGING — DX DG HIP (WITH OR WITHOUT PELVIS) 2-3V*R*
3 series · 3 of 3 positions shown · non-contrast
Comparison: CT of the abdomen pelvis dated 05/05/2016

CLINICAL DATA: 60-year-old male with right hip pain. No known
injury.

EXAM:
DG HIP (WITH OR WITHOUT PELVIS) 2-3V RIGHT

[pelvis ap]
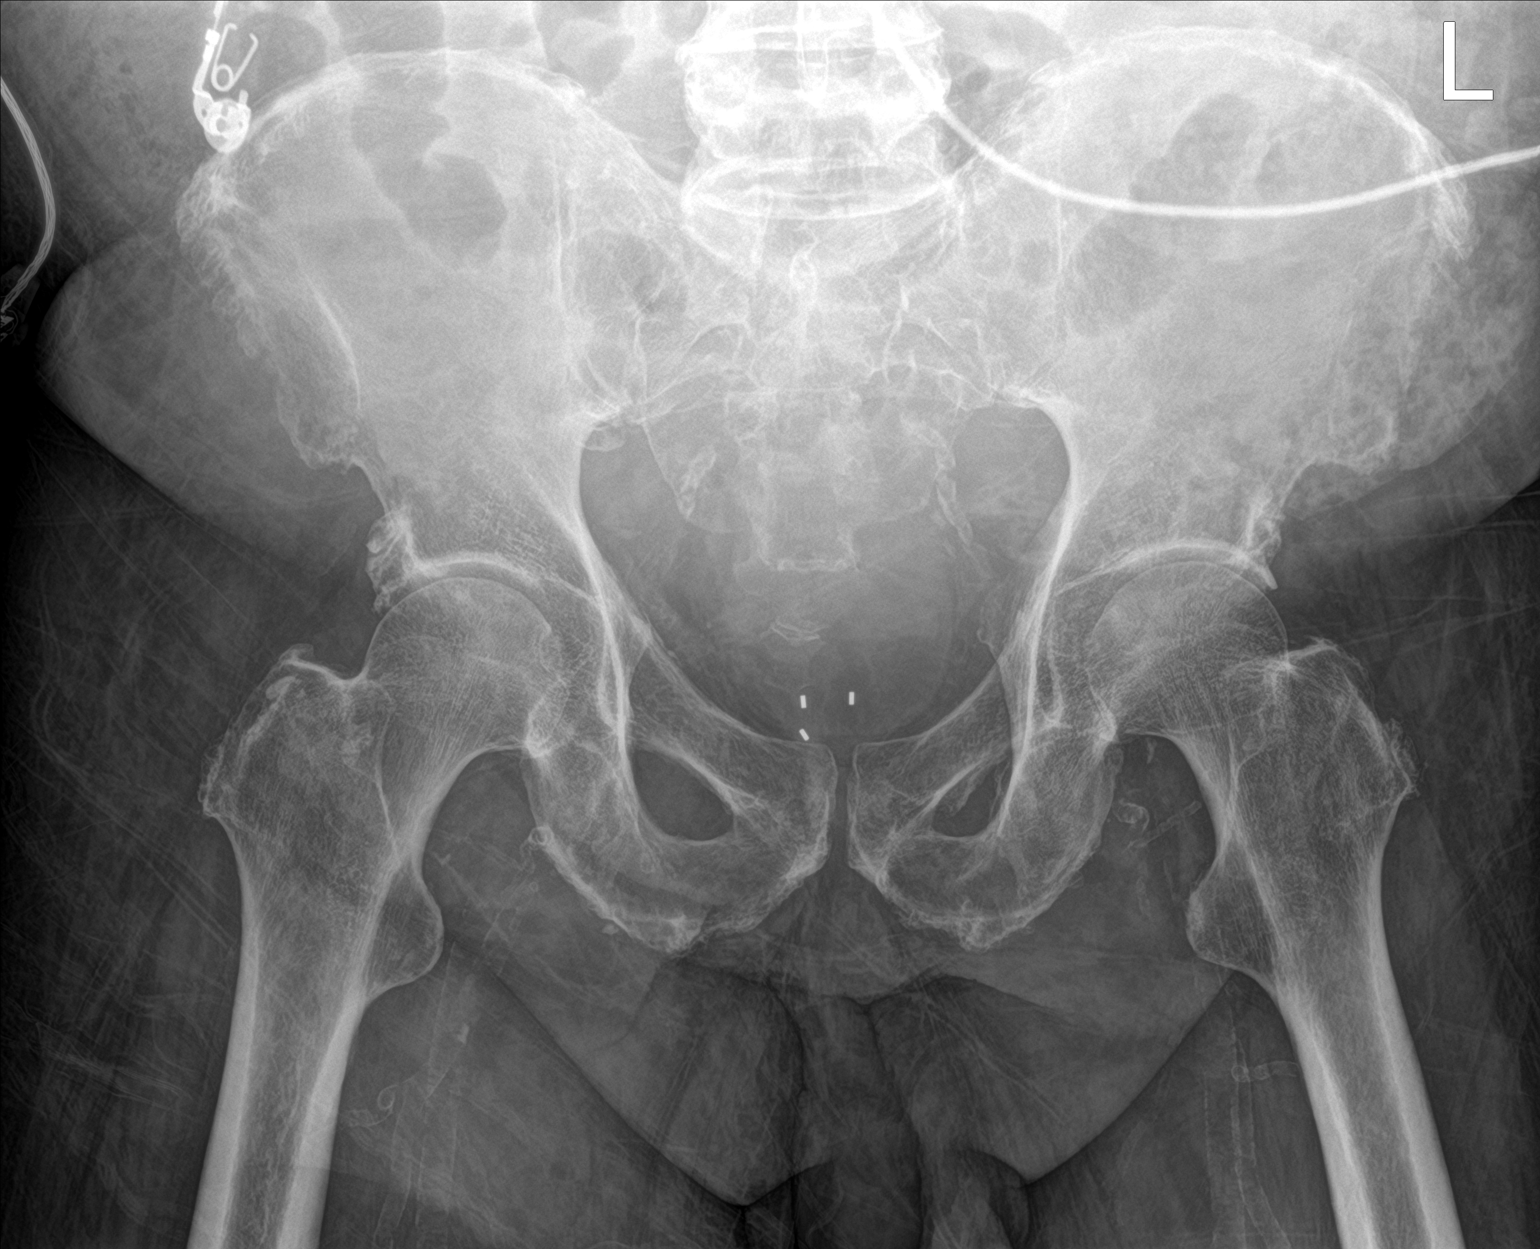

[hip ap]
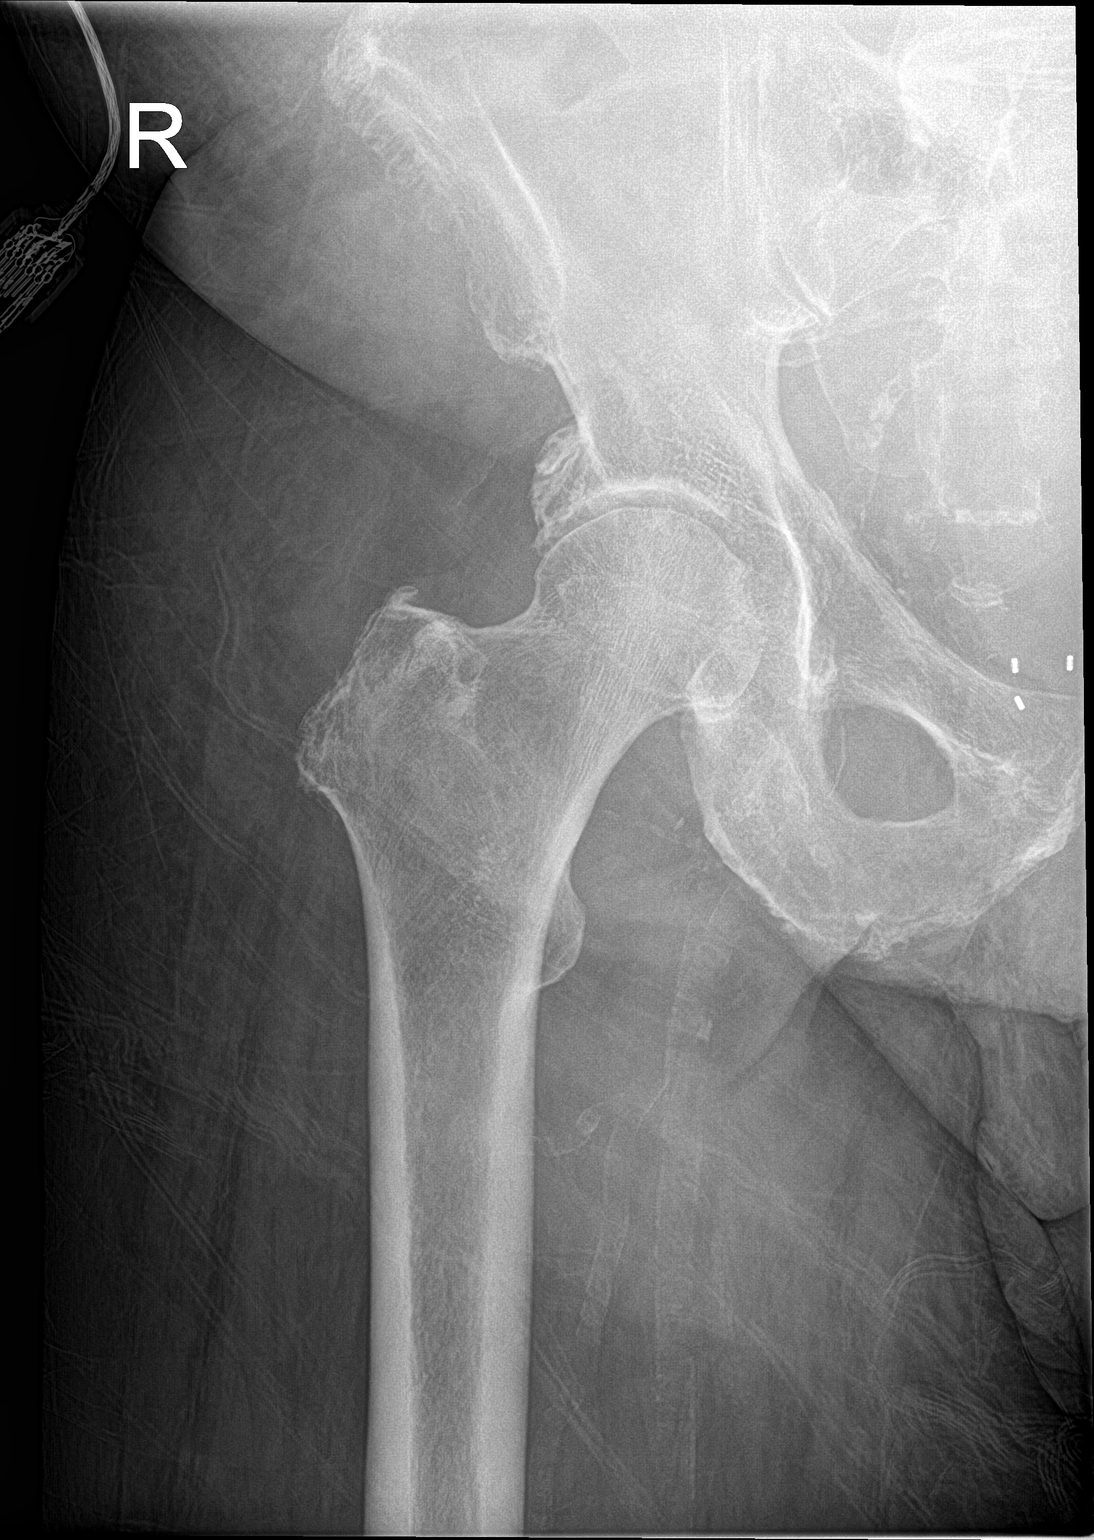

[hip lat]
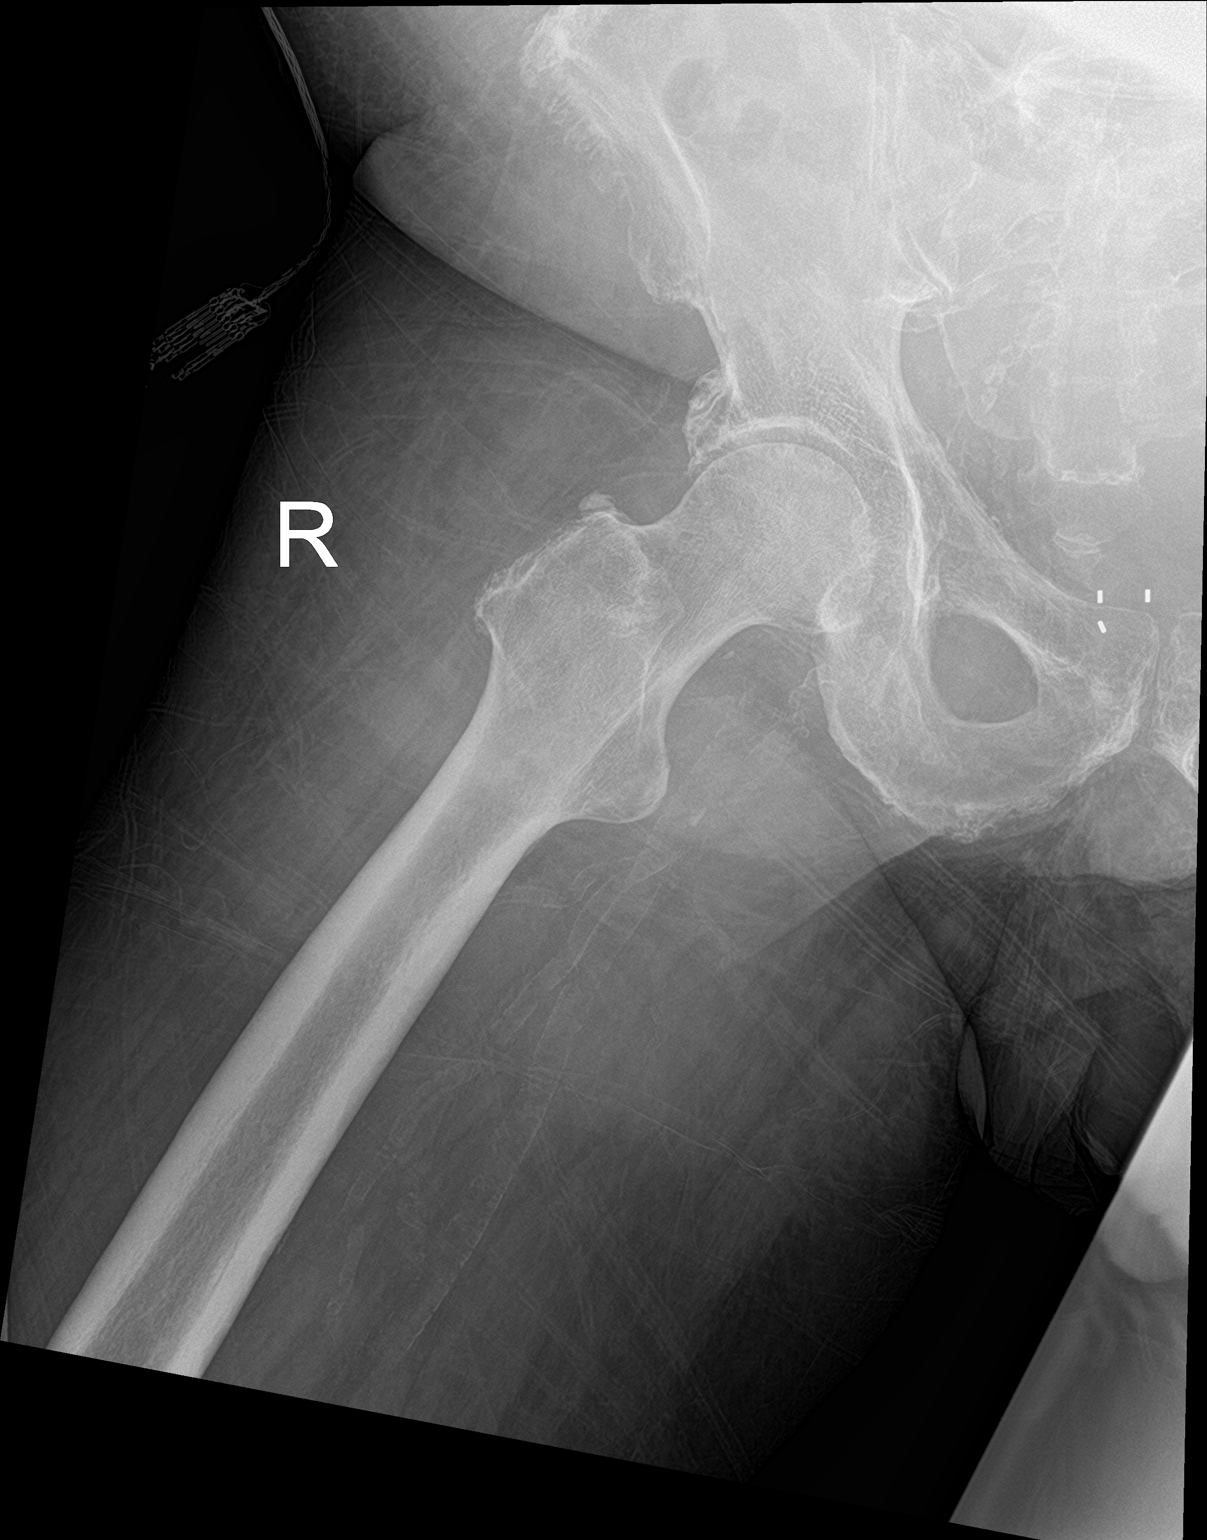

[3 of 3 positions shown; findings below may reference images not displayed]

FINDINGS: There is no acute fracture or dislocation. The bones are mildly
osteopenic. There is mild osteoarthritic changes of the hips.
Multiple metallic biopsy clips noted in the region of the prostate
gland. The soft tissues appear unremarkable with
IMPRESSION: No acute fracture or dislocation.

Mild arthritic changes.

## 2018-11-20 IMAGING — CT CT HIP*R* W/O CM
2 of 3 series · 15 of 46 positions shown, 17 images · non-contrast
Comparison: CT abdomen and pelvis from 1 day prior.

CLINICAL DATA: Extreme right hip pain and bilateral lower extremity
swelling without known injury.

EXAM:
CT OF THE RIGHT HIP WITHOUT CONTRAST
TECHNIQUE: Multidetector CT imaging of the right hip was performed according to
the standard protocol. Multiplanar CT image reconstructions were
also generated.

[Series 3: pelvis 2.0 st · axial · 0.61mm/px · z∈[+717,+1045]mm · 12 of 190 slices shown, 14 images]
[im 13/190  soft-tissue]
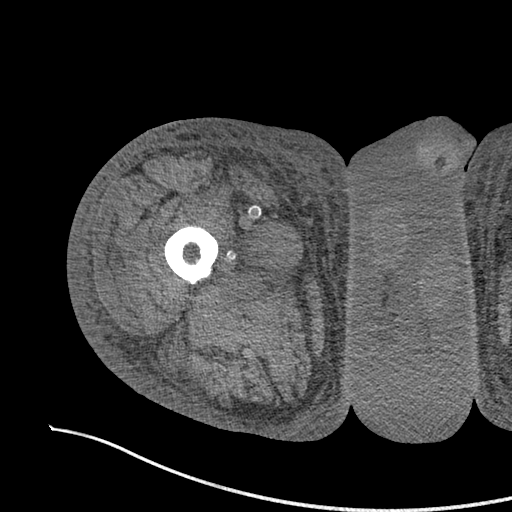
[im 13/190  bone]
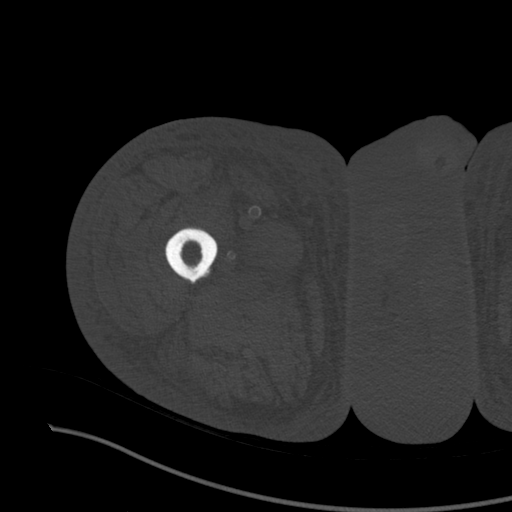
[im 25/190  soft-tissue]
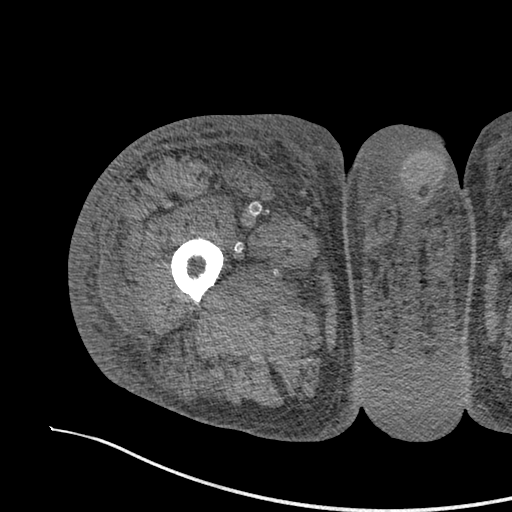
[im 43/190  soft-tissue]
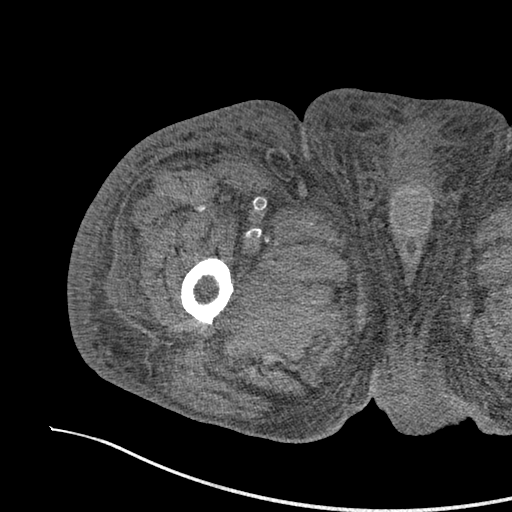
[im 55/190  soft-tissue]
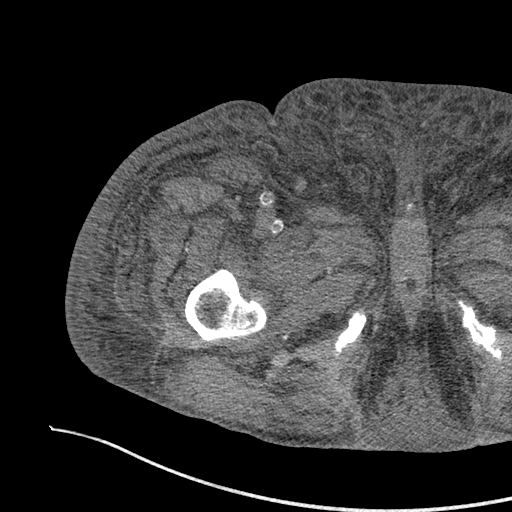
[im 74/190  soft-tissue]
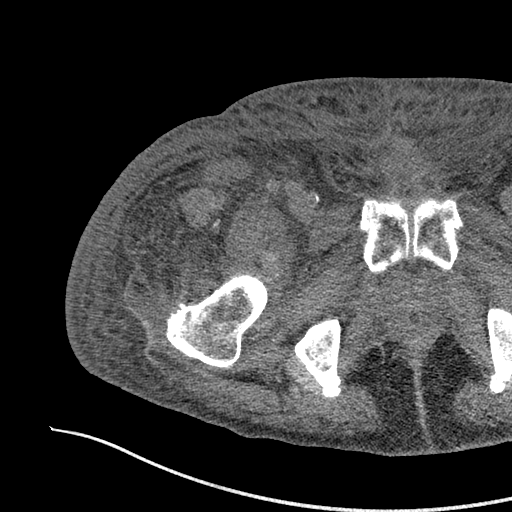
[im 86/190  soft-tissue]
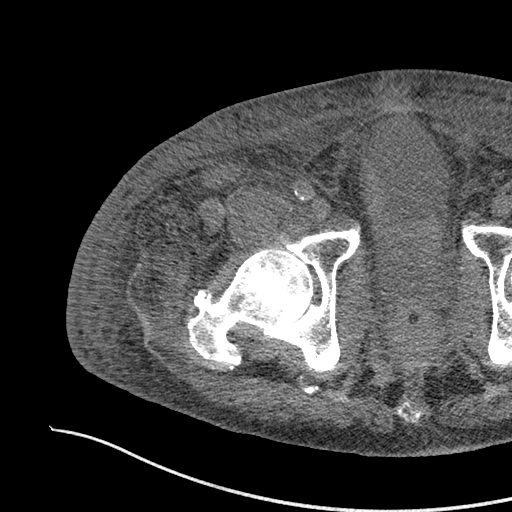
[im 104/190  soft-tissue]
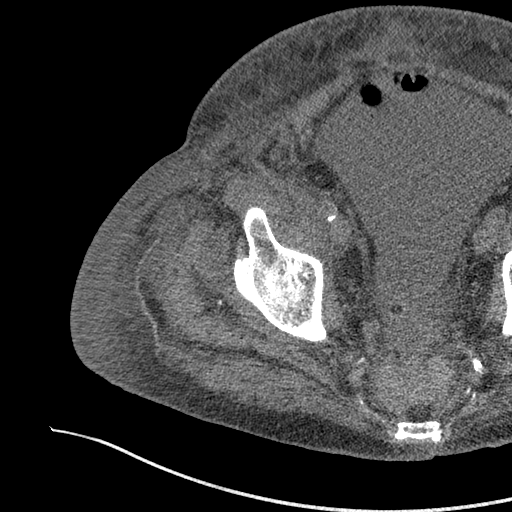
[im 116/190  soft-tissue]
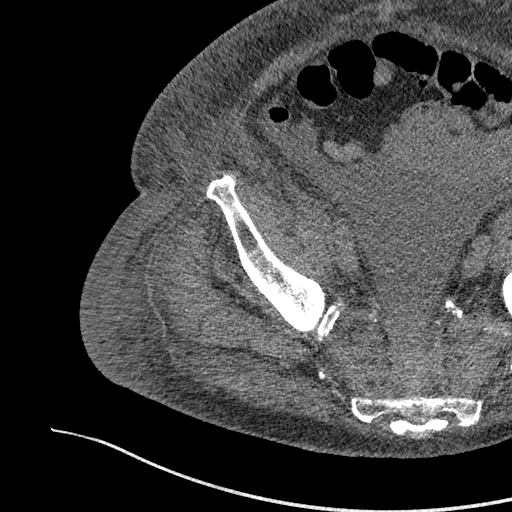
[im 135/190  soft-tissue]
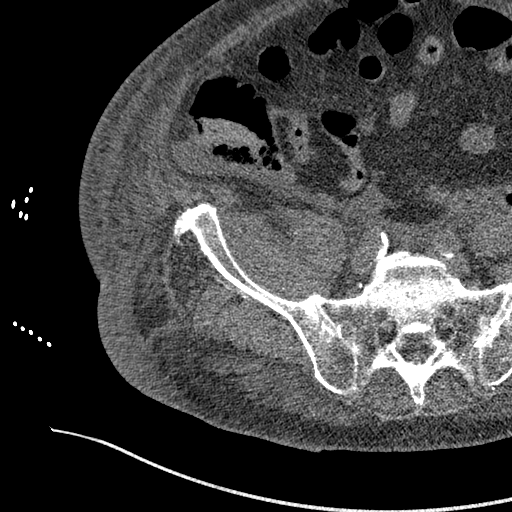
[im 135/190  bone]
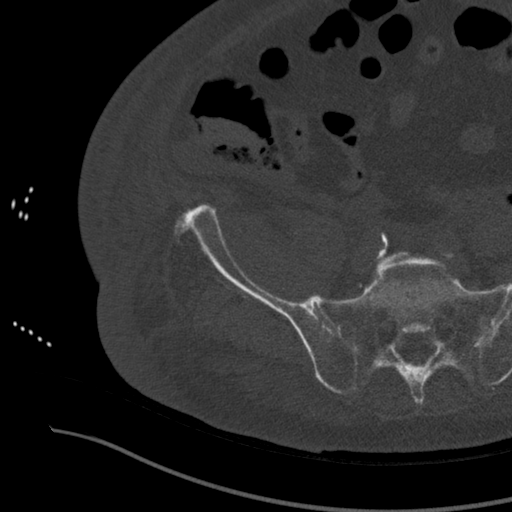
[im 147/190  soft-tissue]
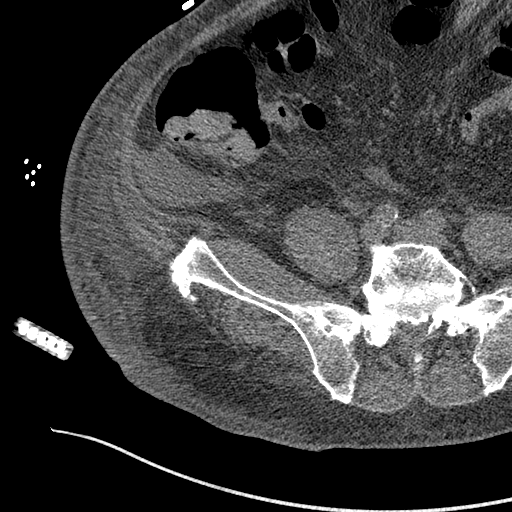
[im 165/190  soft-tissue]
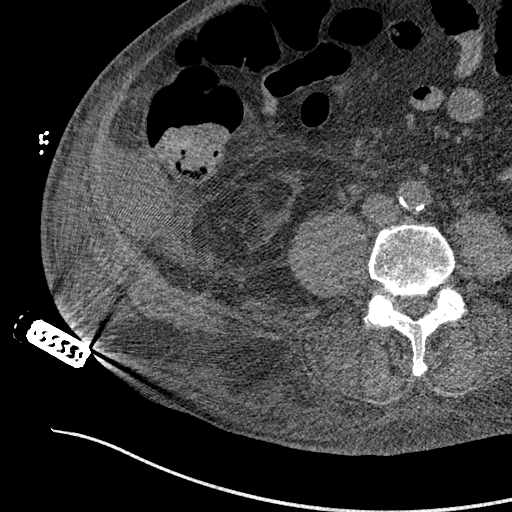
[im 177/190  soft-tissue]
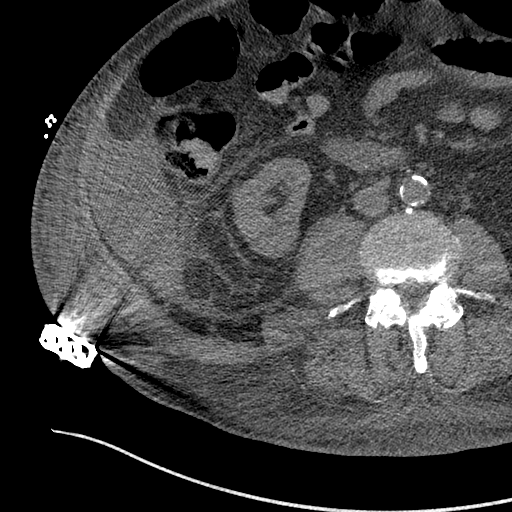

[Series 8: coronal st · coronal · 0.49mm/px · 3 of 159 slices shown]
[im 53/159  soft-tissue]
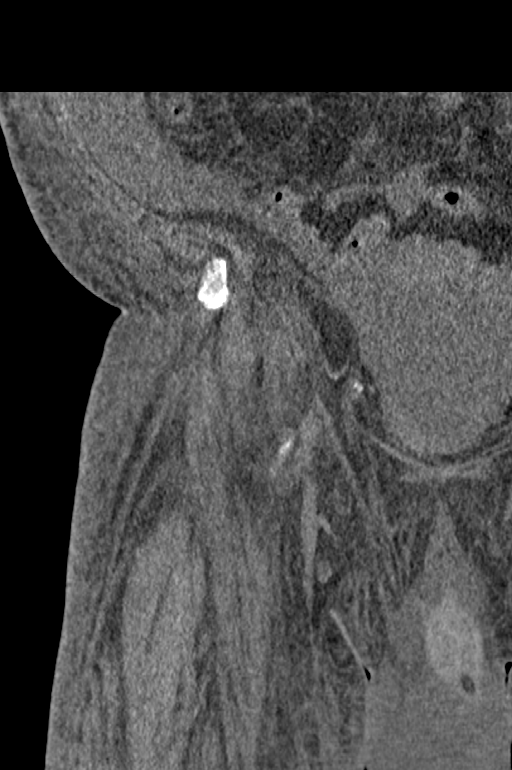
[im 71/159  soft-tissue]
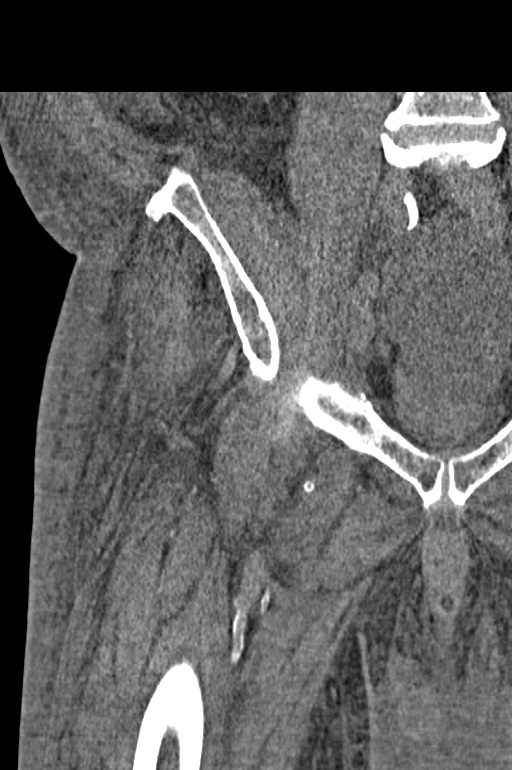
[im 88/159  soft-tissue]
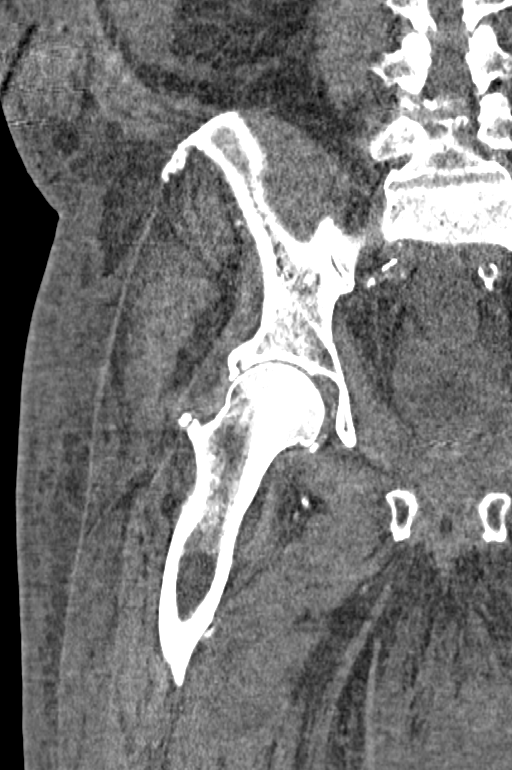

[15 of 46 positions shown; findings below may reference images not displayed]

FINDINGS: Bones/Joint/Cartilage

Osteopenic appearance of the included pelvis and right hip without
fracture or bone destruction. There is lower lumbar facet
arthropathy with facet joint space narrowing, sclerosis and
hypertrophy. No flattening of the included femoral heads. No joint
dislocations. Hypertrophic degenerative changes are seen of both
acetabular roofs. Small sclerotic focus of the left acetabular roof
is noted, nonspecific possibly a bone island in isolation. No other
osseous lesions are apparent. Right-sided gluteal calcific
tendinopathy.

Ligaments

Suboptimally assessed by CT.

Muscles and Tendons

Generalized muscle atrophy without intramuscular hemorrhage or mass
its.

Soft tissues

There is diffuse anasarca with abdominopelvic ascites.
Aortoiliofemoral and branch vessel atherosclerosis without aneurysm.
Large intrascrotal hydroceles. Brachytherapy seeds in the prostate.
IMPRESSION: 1. No acute fracture nor dislocation of either hip. No evidence of
AVN. Probable right-sided gluteal tendinopathy.
2. The bones are demineralized in appearance. There is generalized
muscle atrophy about the right hip.
3. Anasarca with moderate abdominopelvic ascites.
4. Moderate to large bilateral hydroceles.

## 2018-12-03 IMAGING — CR DG CHEST 1V PORT
1 series · 1 of 1 positions shown · non-contrast
Comparison: July 11, 2017

CLINICAL DATA: Acute respiratory failure.

EXAM:
PORTABLE CHEST 1 VIEW

[AP]
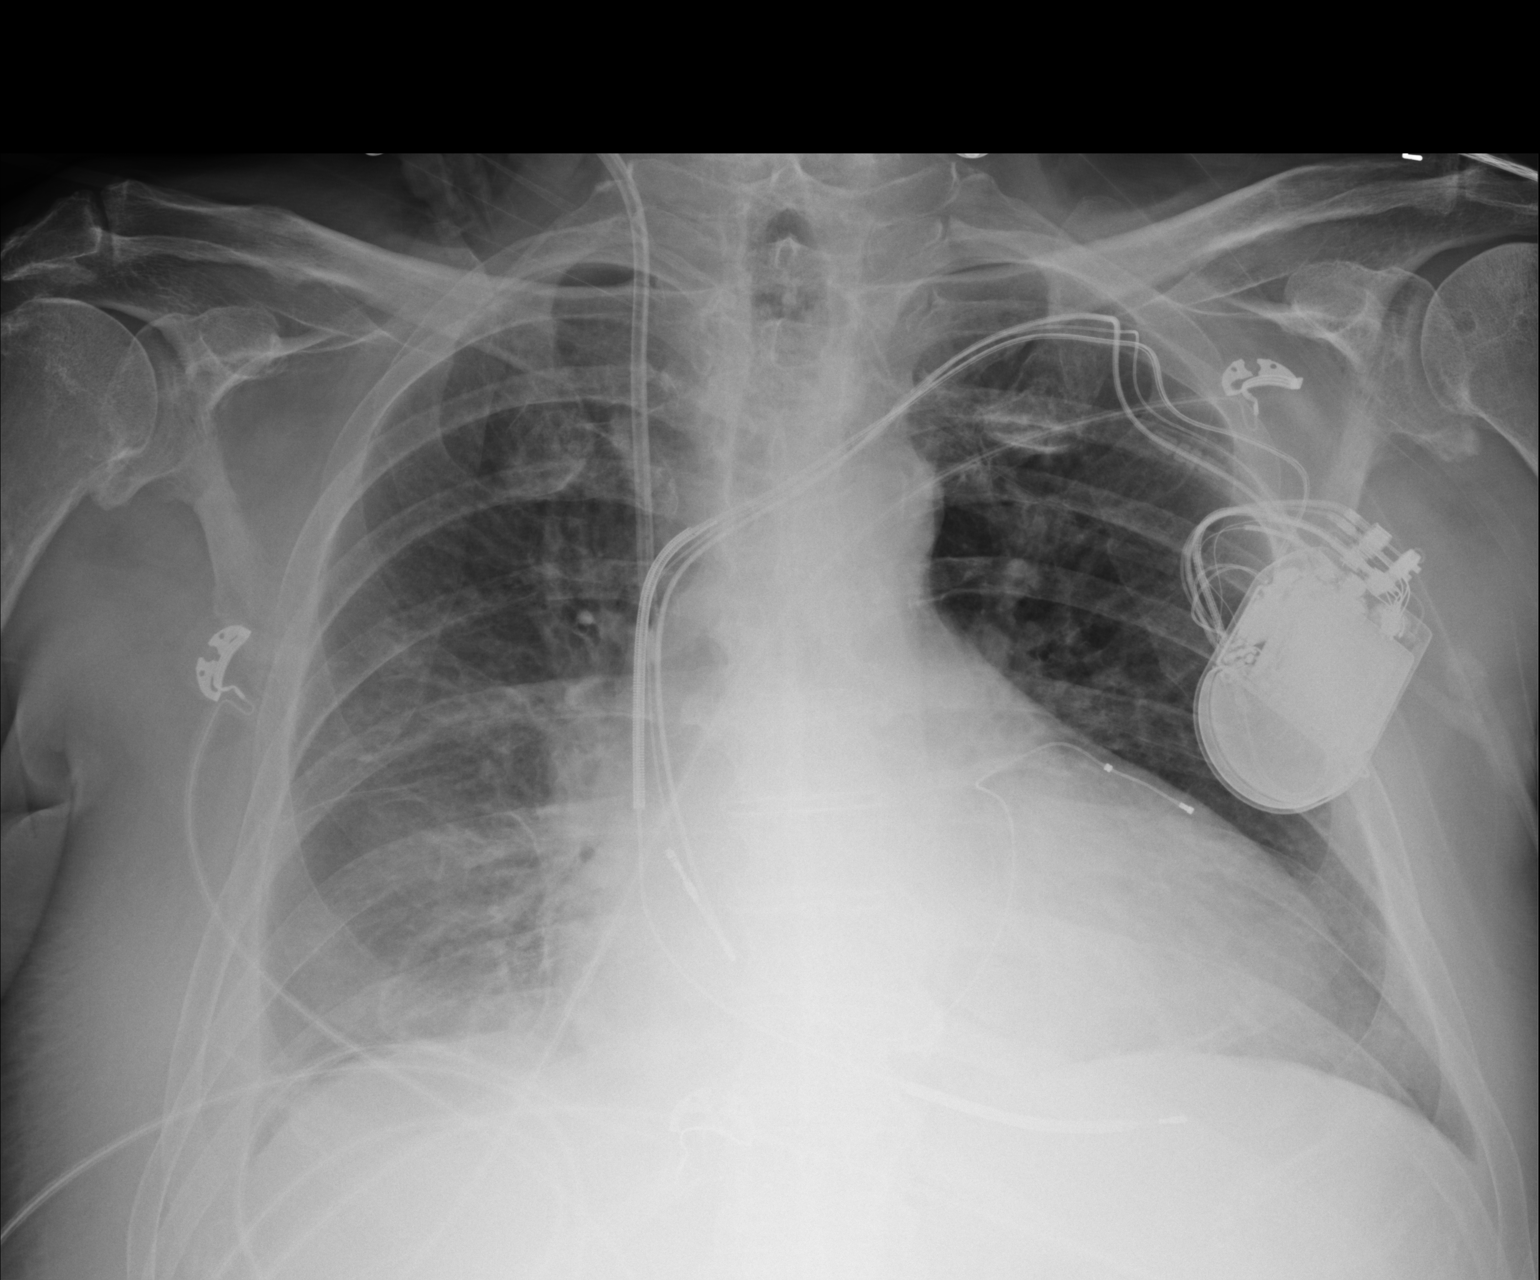

[1 of 1 positions shown; findings below may reference images not displayed]

FINDINGS: Stable support apparatus. No pneumothorax. Persistent but decreasing
right effusion with underlying atelectasis. Stable cardiomediastinal
silhouette. No overt edema.
IMPRESSION: Improving effusion on the right.  No other interval change.

## 2018-12-06 IMAGING — US IR FLUORO GUIDE CV LINE*R*
1 series · 1 of 1 positions shown · non-contrast
Comparison: none

INDICATION: IV access required for infection

[Series 1: ir (id) (id)/(id) · 1 of 1 slices shown]
[im 1/1]
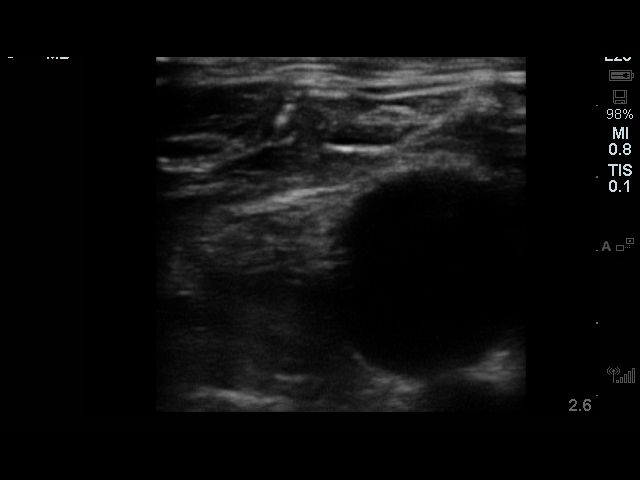

[1 of 1 positions shown; findings below may reference images not displayed]

EXAM:
RIGHT JUGULAR TUNNELED PICC LINE PLACEMENT WITH ULTRASOUND AND
FLUOROSCOPIC GUIDANCE

MEDICATIONS:
None

ANESTHESIA/SEDATION:
None

FLUOROSCOPY TIME:  Fluoroscopy Time:  minutes 48 seconds (6 mGy).

COMPLICATIONS:
None immediate.

PROCEDURE:
The patient was advised of the possible risks and complications and
agreed to undergo the procedure. The patient was then brought to the
angiographic suite for the procedure.

The right neck was prepped with chlorhexidine, draped in the usual
sterile fashion using maximum barrier technique (cap and mask,
sterile gown, sterile gloves, large sterile sheet, hand hygiene and
cutaneous antiseptic). Local anesthesia was attained by infiltration
with 1% lidocaine.

Ultrasound demonstrated patency of the right jugular vein, and this
was documented with an image. Under real-time ultrasound guidance,
this vein was accessed with a 21 gauge micropuncture needle and
image documentation was performed. The needle was exchanged over a
guidewire for a peel-away sheath through which a 20 cm 6 French
double lumen power injectable PICC was advanced, and positioned with
its tip at the lower SVC/right atrial junction. The cuff was
positioned in the subcutaneous tract. Fluoroscopy during the
procedure and fluoro spot radiograph confirms appropriate catheter
position. The catheter was flushed, secured to the skin with Prolene
sutures, and covered with a sterile dressing.
IMPRESSION: Successful placement of a right jugular tunneled PICC with
sonographic and fluoroscopic guidance. The catheter is ready for
use.

## 2019-01-05 IMAGING — US US ABDOMEN LIMITED
1 series · 13 of 25 positions shown · non-contrast
Comparison: 06/10/2016 ultrasound.

CLINICAL DATA: 60-year-old male with cirrhosis. Hepatocellular
carcinoma screening. History of biliary stent placement and removal
7869. Initial encounter.

EXAM:
US ABDOMEN LIMITED - RIGHT UPPER QUADRANT

[Series 1: us abdomen limited · 0.21mm/px · 13 of 48 slices shown]
[im 1/48]
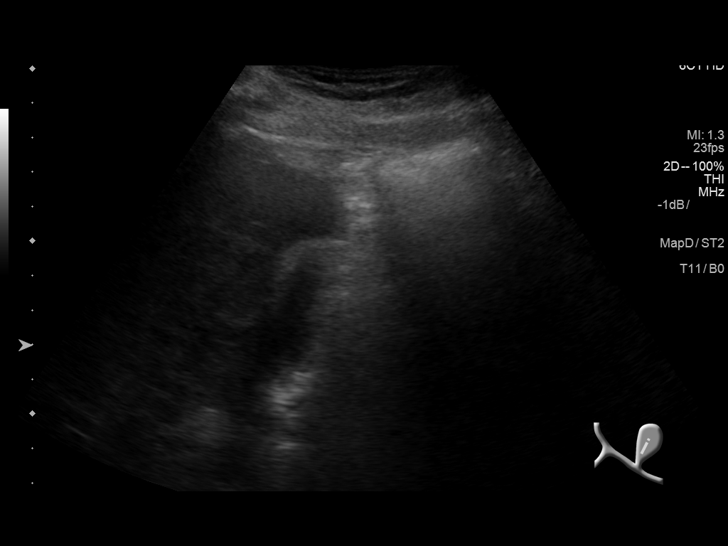
[im 4/48]
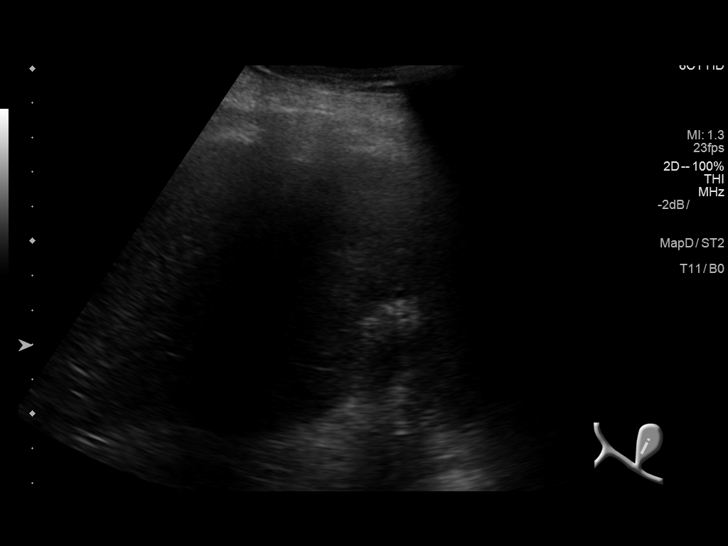
[im 8/48]
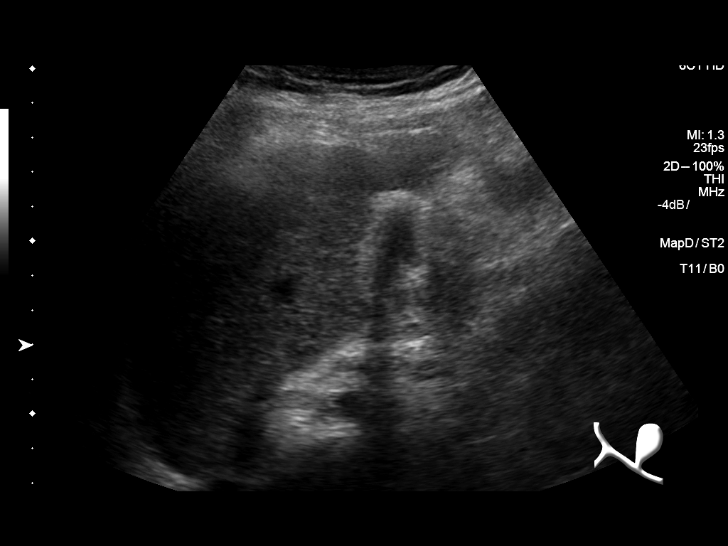
[im 12/48]
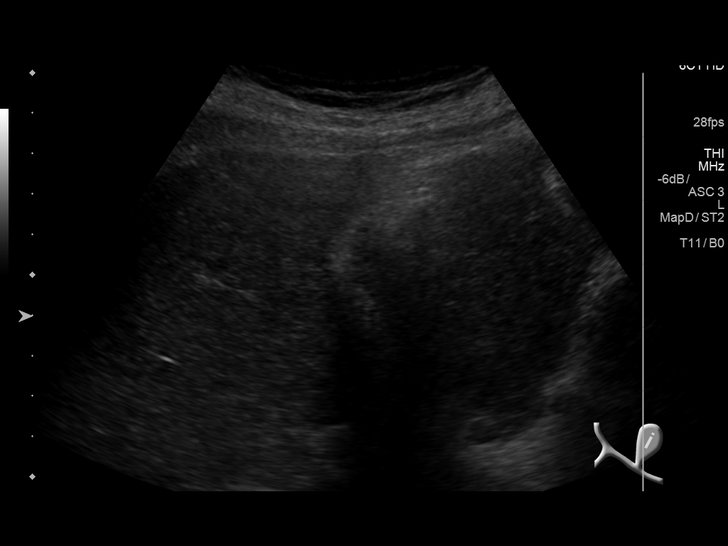
[im 16/48]
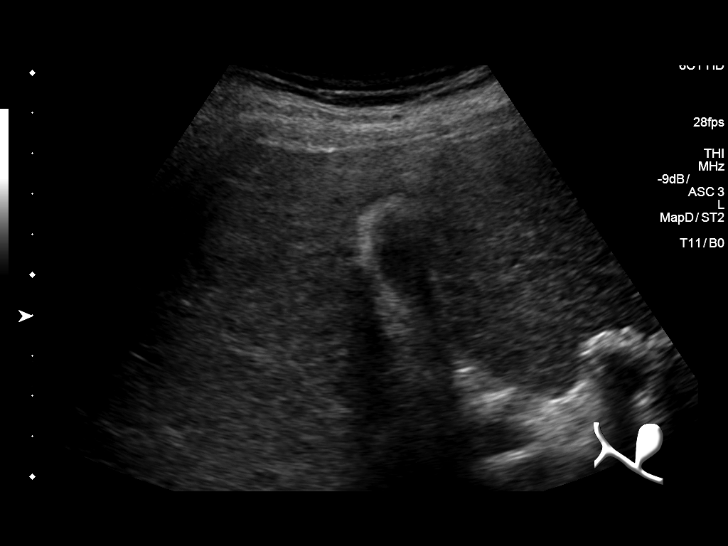
[im 20/48]
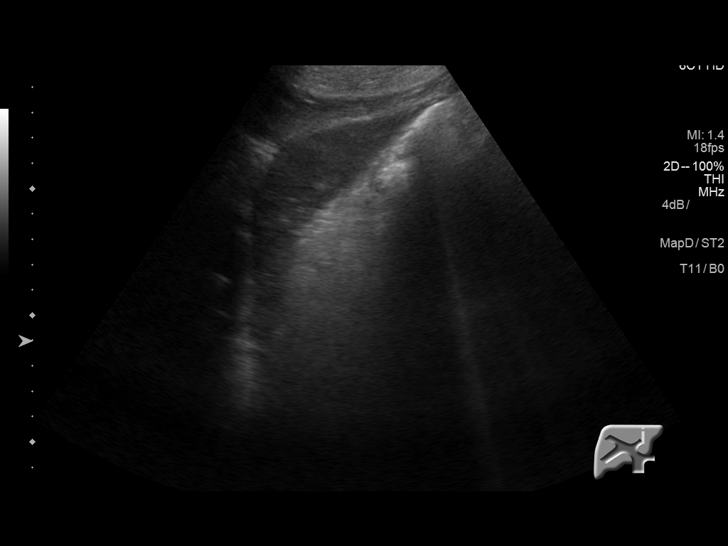
[im 24/48]
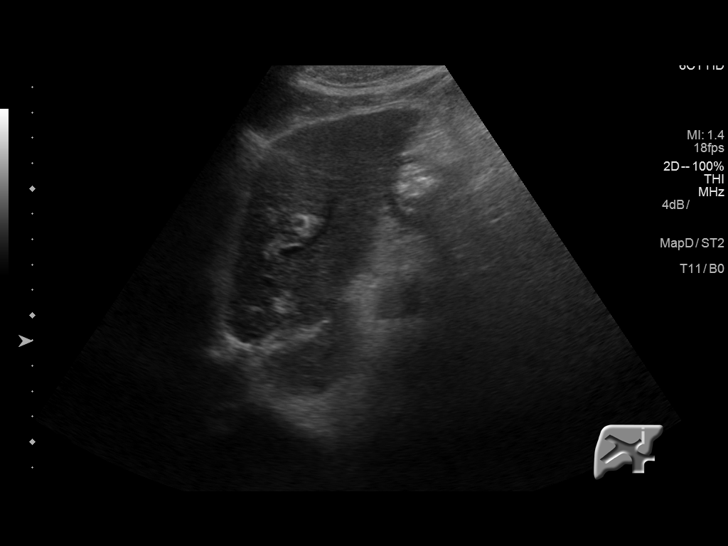
[im 28/48]
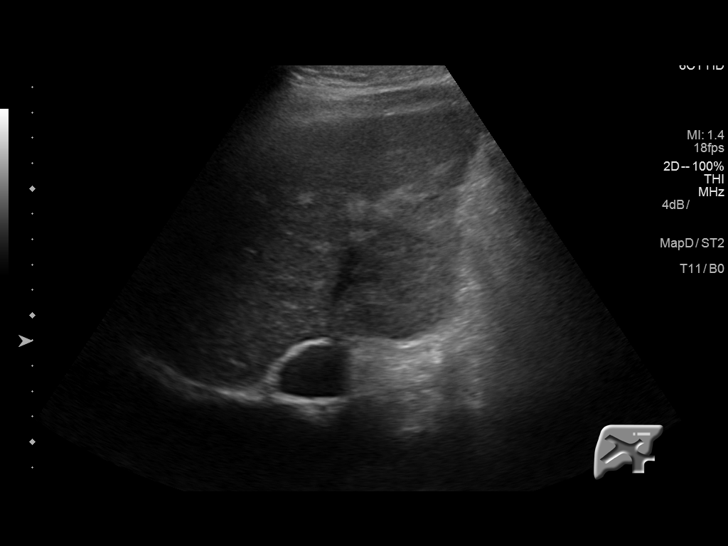
[im 32/48]
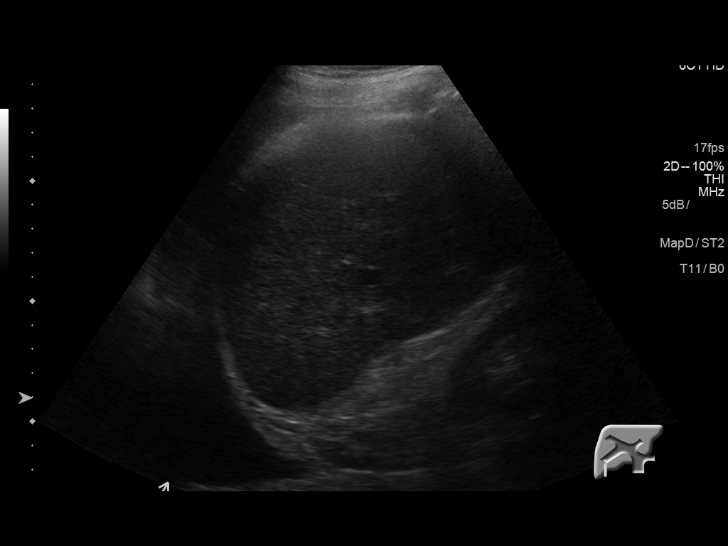
[im 36/48]
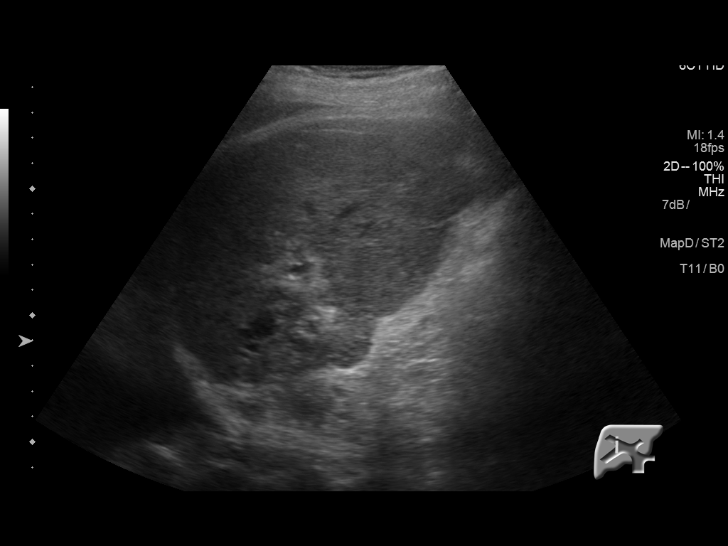
[im 40/48]
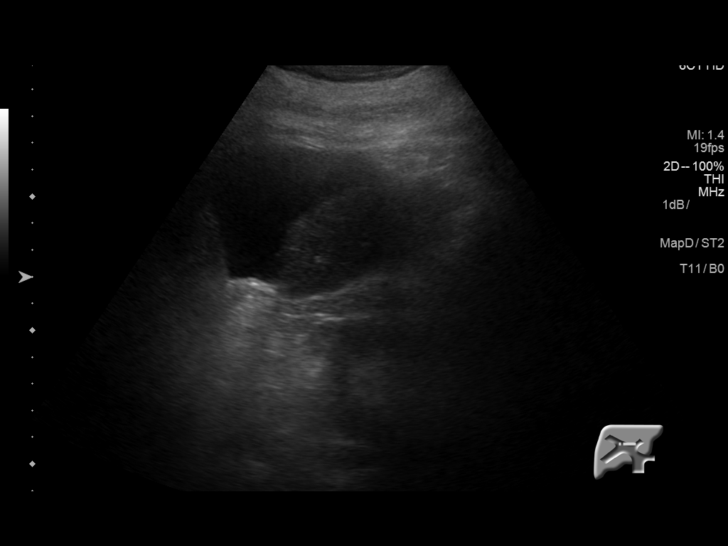
[im 44/48]
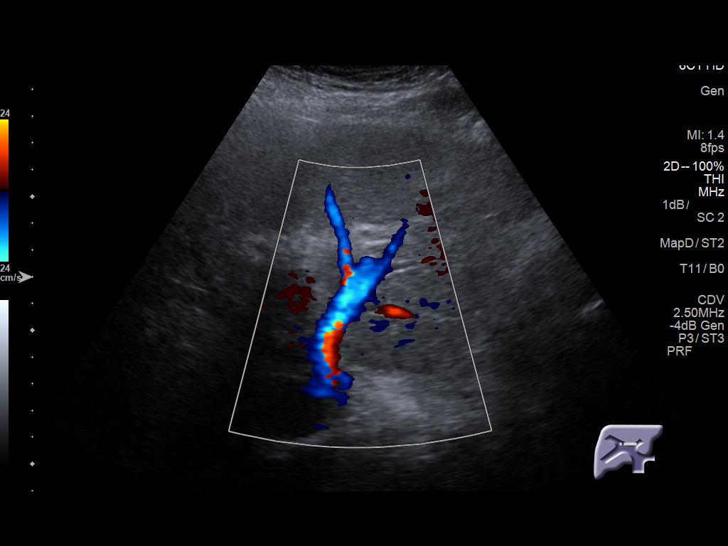
[im 48/48]
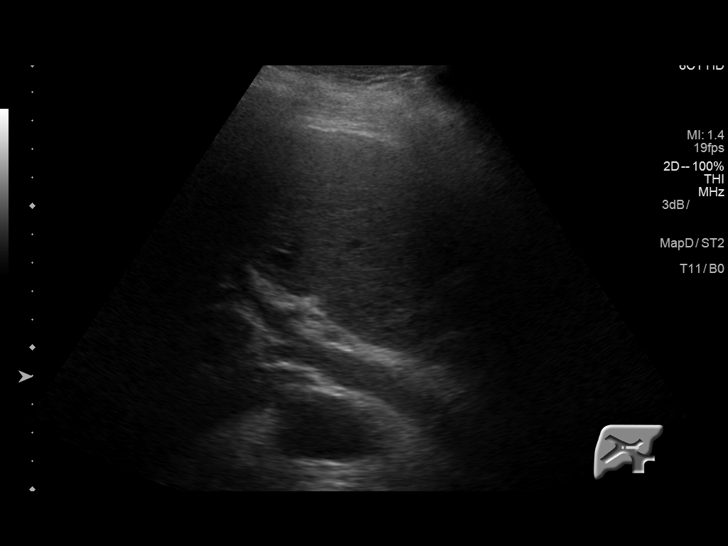

[13 of 25 positions shown; findings below may reference images not displayed]

FINDINGS: Gallbladder:

Small gallstones. Gallbladder wall thickening measuring up to
mm. No tenderness over the gallbladder during scanning per
ultrasound technologist.

Common bile duct:

Diameter: 2.3 mm proximal to mid aspect. Distal aspect not
visualized secondary to bowel gas.

Liver:

Slightly heterogeneous increased echogenicity without focal hepatic
lesion. No intrahepatic biliary duct dilation.

Right-sided pleural thickening.
IMPRESSION: Slightly heterogeneous increased echogenicity of the liver may
reflect changes of patient's cirrhosis without focal hepatic lesion
or intrahepatic biliary duct dilation.

Small gallstones. Gallbladder wall thickening measuring up to
mm. The patient was not tender over this region during scanning
therefore it is possible that the gallbladder wall thickening is
related to partial contraction, cirrhosis or increased right heart
pressure. Chronic cholecystitis is a secondary consideration.

Right-sided pleural effusion.

Right-sided pleural effusion.

## 2019-01-13 IMAGING — CT CT CHEST W/O CM
2 of 3 series · 15 of 36 positions shown, 18 images · non-contrast
Comparison: 08/31/2017 and prior radiographs. 07/10/2017 abdominal
CT. 05/26/2009 chest CT

CLINICAL DATA: 60-year-old male with hemoptysis.

EXAM:
CT CHEST WITHOUT CONTRAST
TECHNIQUE: Multidetector CT imaging of the chest was performed following the
standard protocol without IV contrast.

[Series 5: thorax 2.0 · axial · 0.79mm/px · z∈[+1011,+1249]mm · 12 of 141 slices shown, 15 images]
[im 11/141  mediastinal]
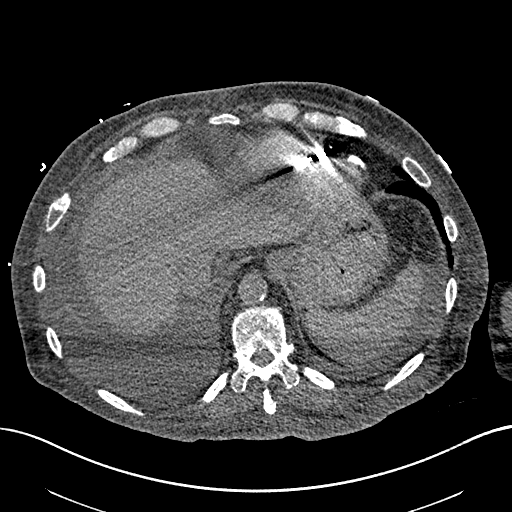
[im 11/141  lung]
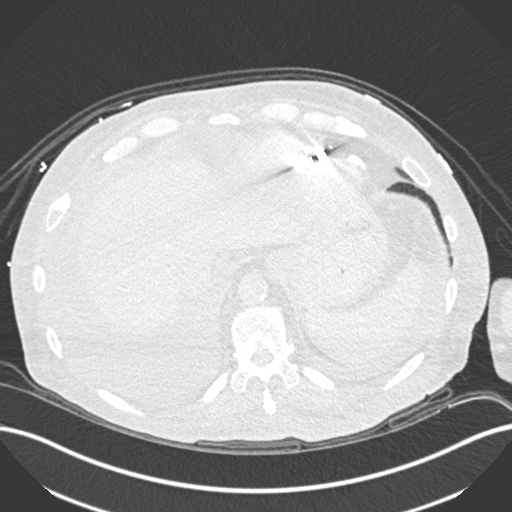
[im 21/141  lung]
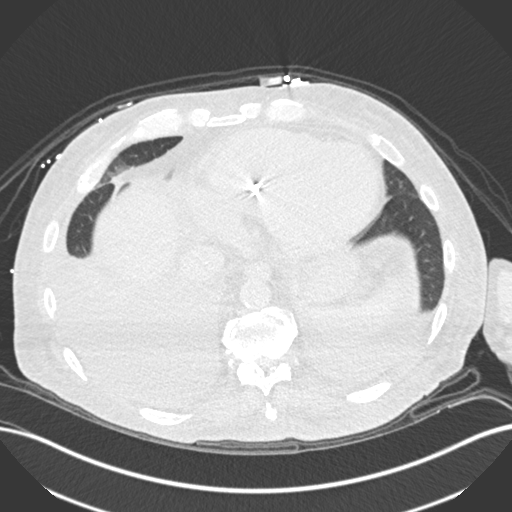
[im 32/141  lung]
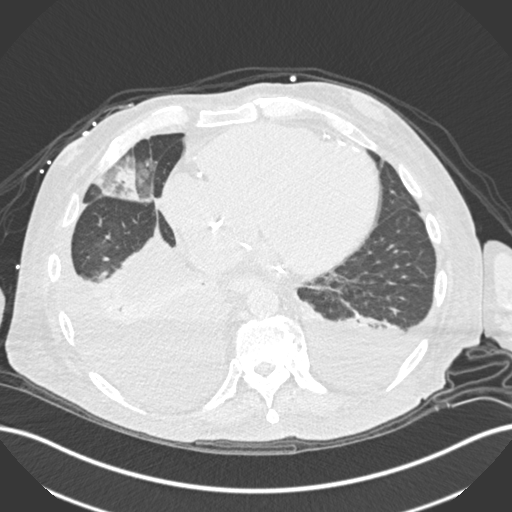
[im 42/141  lung]
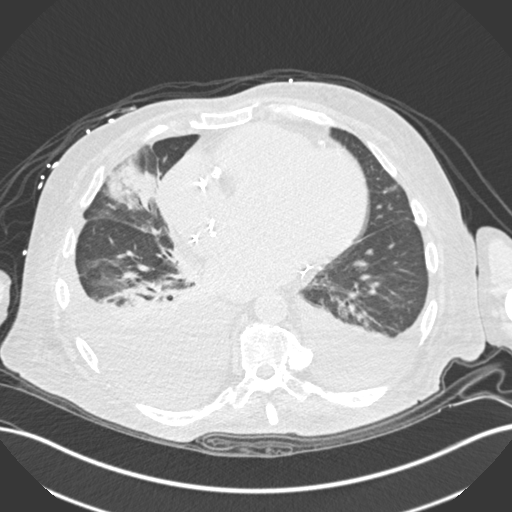
[im 52/141  mediastinal]
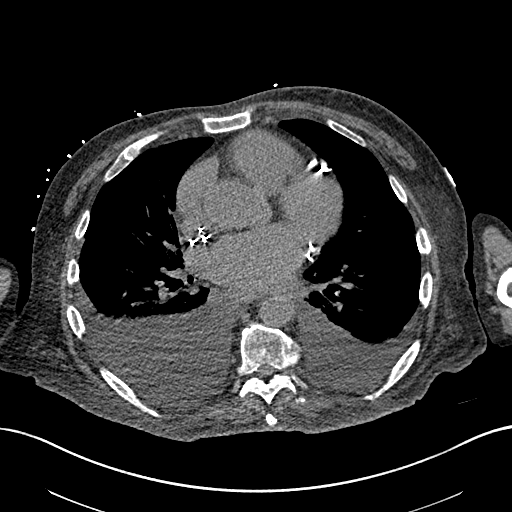
[im 52/141  lung]
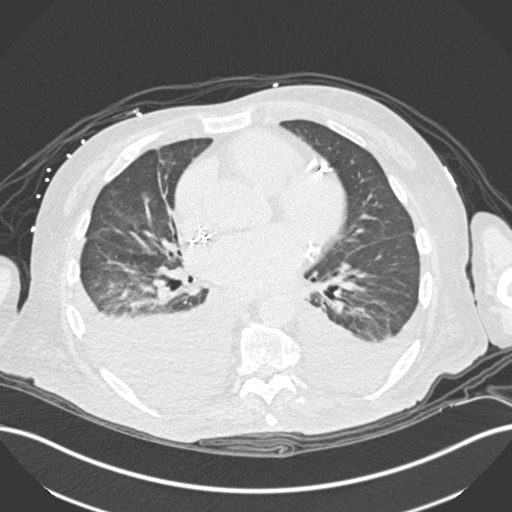
[im 63/141  lung]
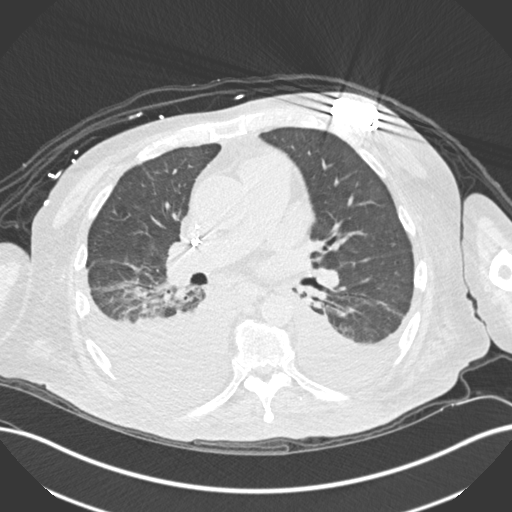
[im 78/141  lung]
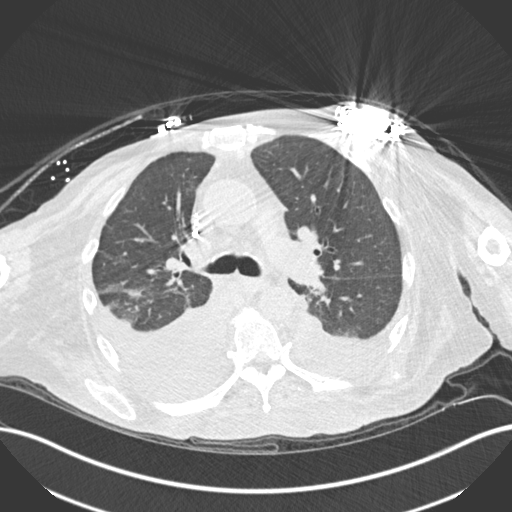
[im 89/141  lung]
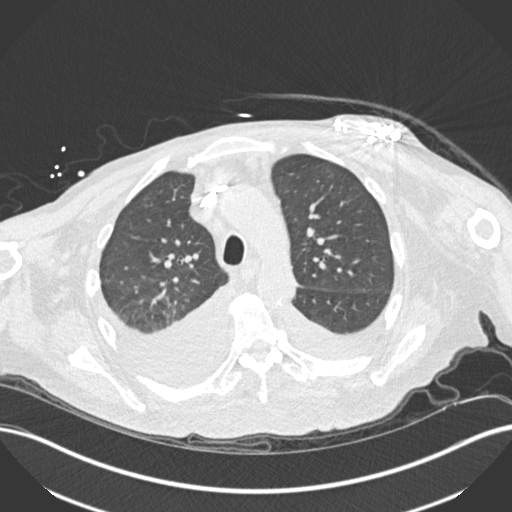
[im 99/141  mediastinal]
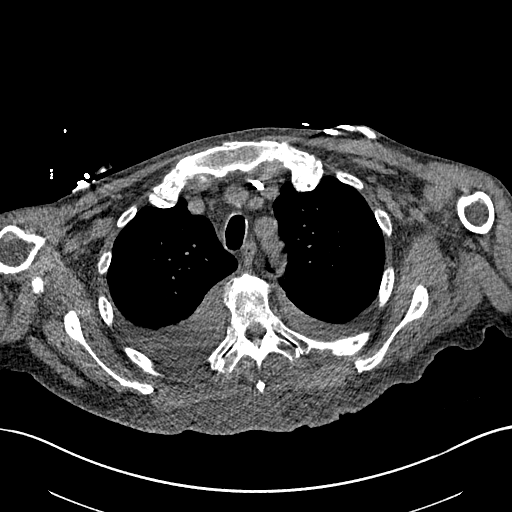
[im 99/141  lung]
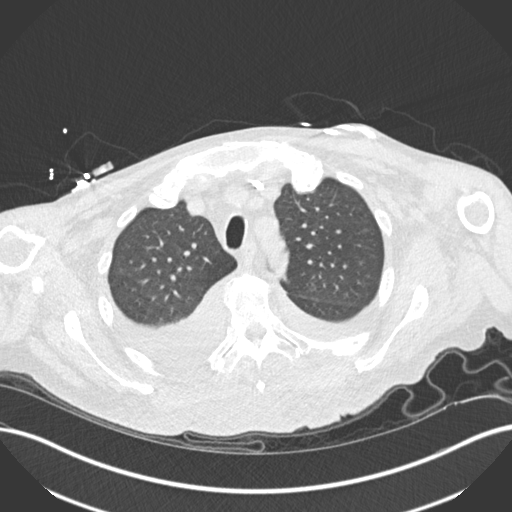
[im 109/141  lung]
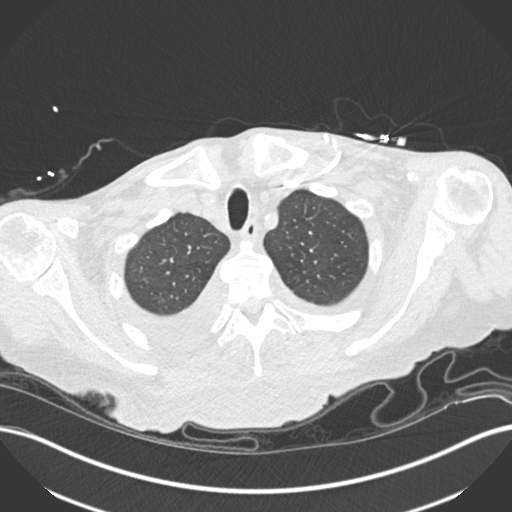
[im 120/141  lung]
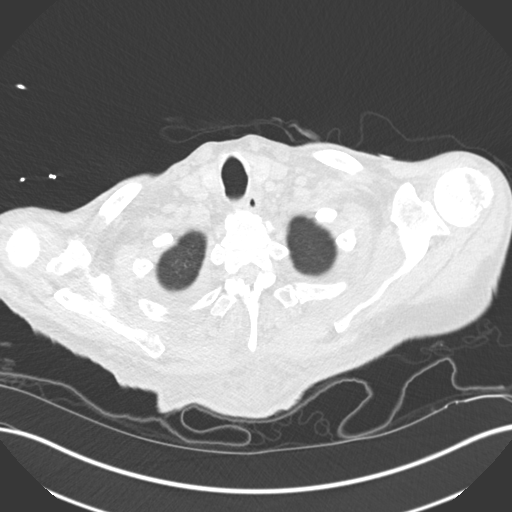
[im 130/141  lung]
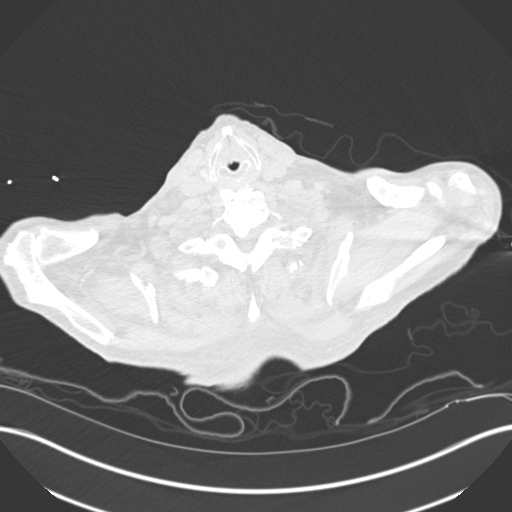

[Series 7: coronal · coronal · 0.57mm/px · 3 of 107 slices shown]
[im 22/107  lung]
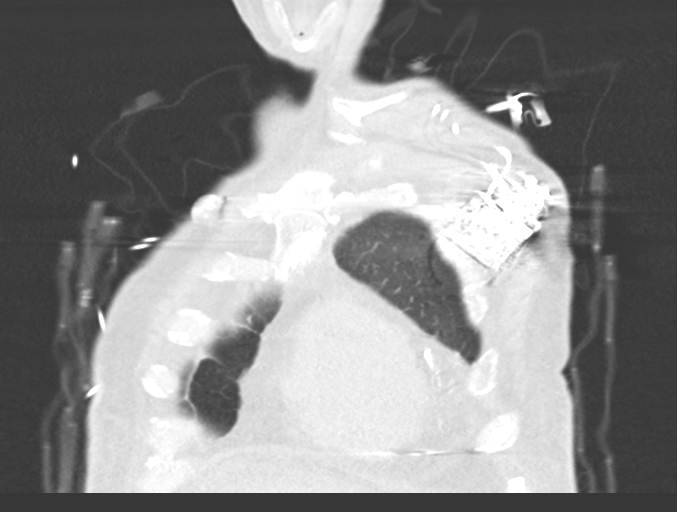
[im 43/107  lung]
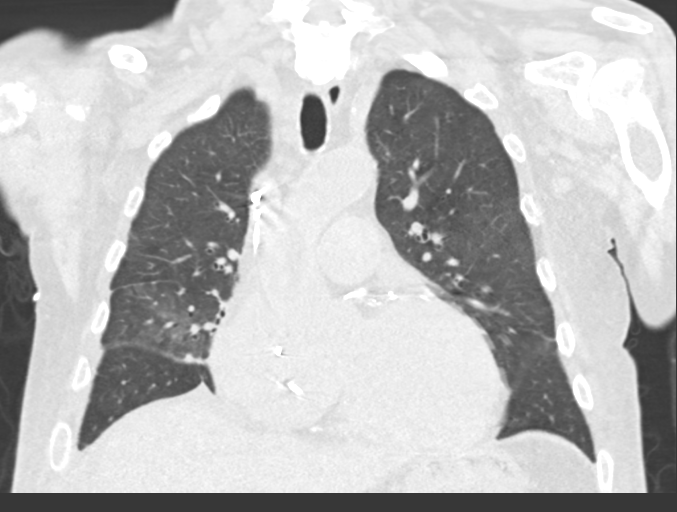
[im 64/107  lung]
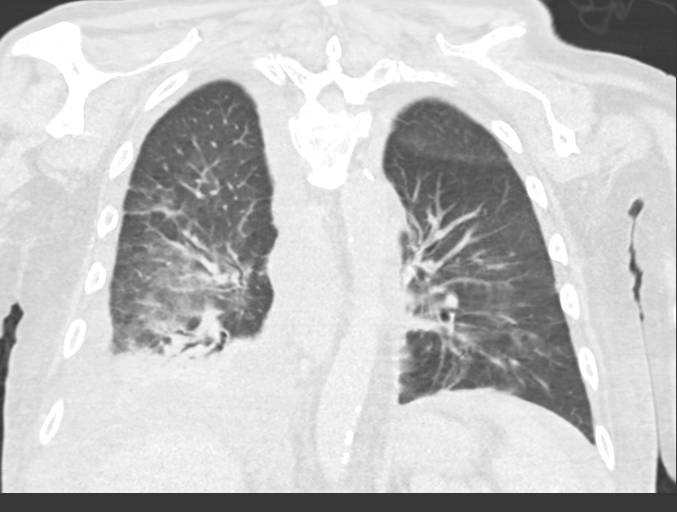

[15 of 36 positions shown; findings below may reference images not displayed]

FINDINGS: Cardiovascular: Cardiomegaly, heavy coronary artery calcifications
and pacemaker/ICD again noted. Thoracic aortic calcifications noted
without aneurysm. No pericardial effusion.

Mediastinum/Nodes: Multiple shotty mediastinal lymph nodes noted. No
mediastinal mass. The trachea, esophagus and thyroid are
unremarkable.

Lungs/Pleura: Airspace disease/ consolidation within the right
middle lobe is compatible with pneumonia or hemorrhage. Moderate to
large right pleural effusion, moderate left pleural effusion are
again noted, increased on the left since 07/10/2017.

Moderate bilateral lower lung atelectasis noted.

No evidence of pneumothorax.

Upper Abdomen: Ascites in the upper abdomen again noted.

Musculoskeletal: No acute or suspicious bony abnormalities.
IMPRESSION: 1. Right middle lobe airspace disease/consolidation, favor pneumonia
over hemorrhage.
2. Moderate to large right pleural effusion and moderate left
pleural effusion. Left pleural effusion has increased since
07/10/2017.
[DATE]. Cardiomegaly and coronary artery disease
4. Ascites again identified
5.  Aortic Atherosclerosis ([id number]).

## 2019-01-13 IMAGING — CR DG CHEST 2V
2 series · 2 of 2 positions shown · non-contrast
Comparison: 07/24/2017

CLINICAL DATA: Hemoptysis today

EXAM:
CHEST  2 VIEW

[chest lat]
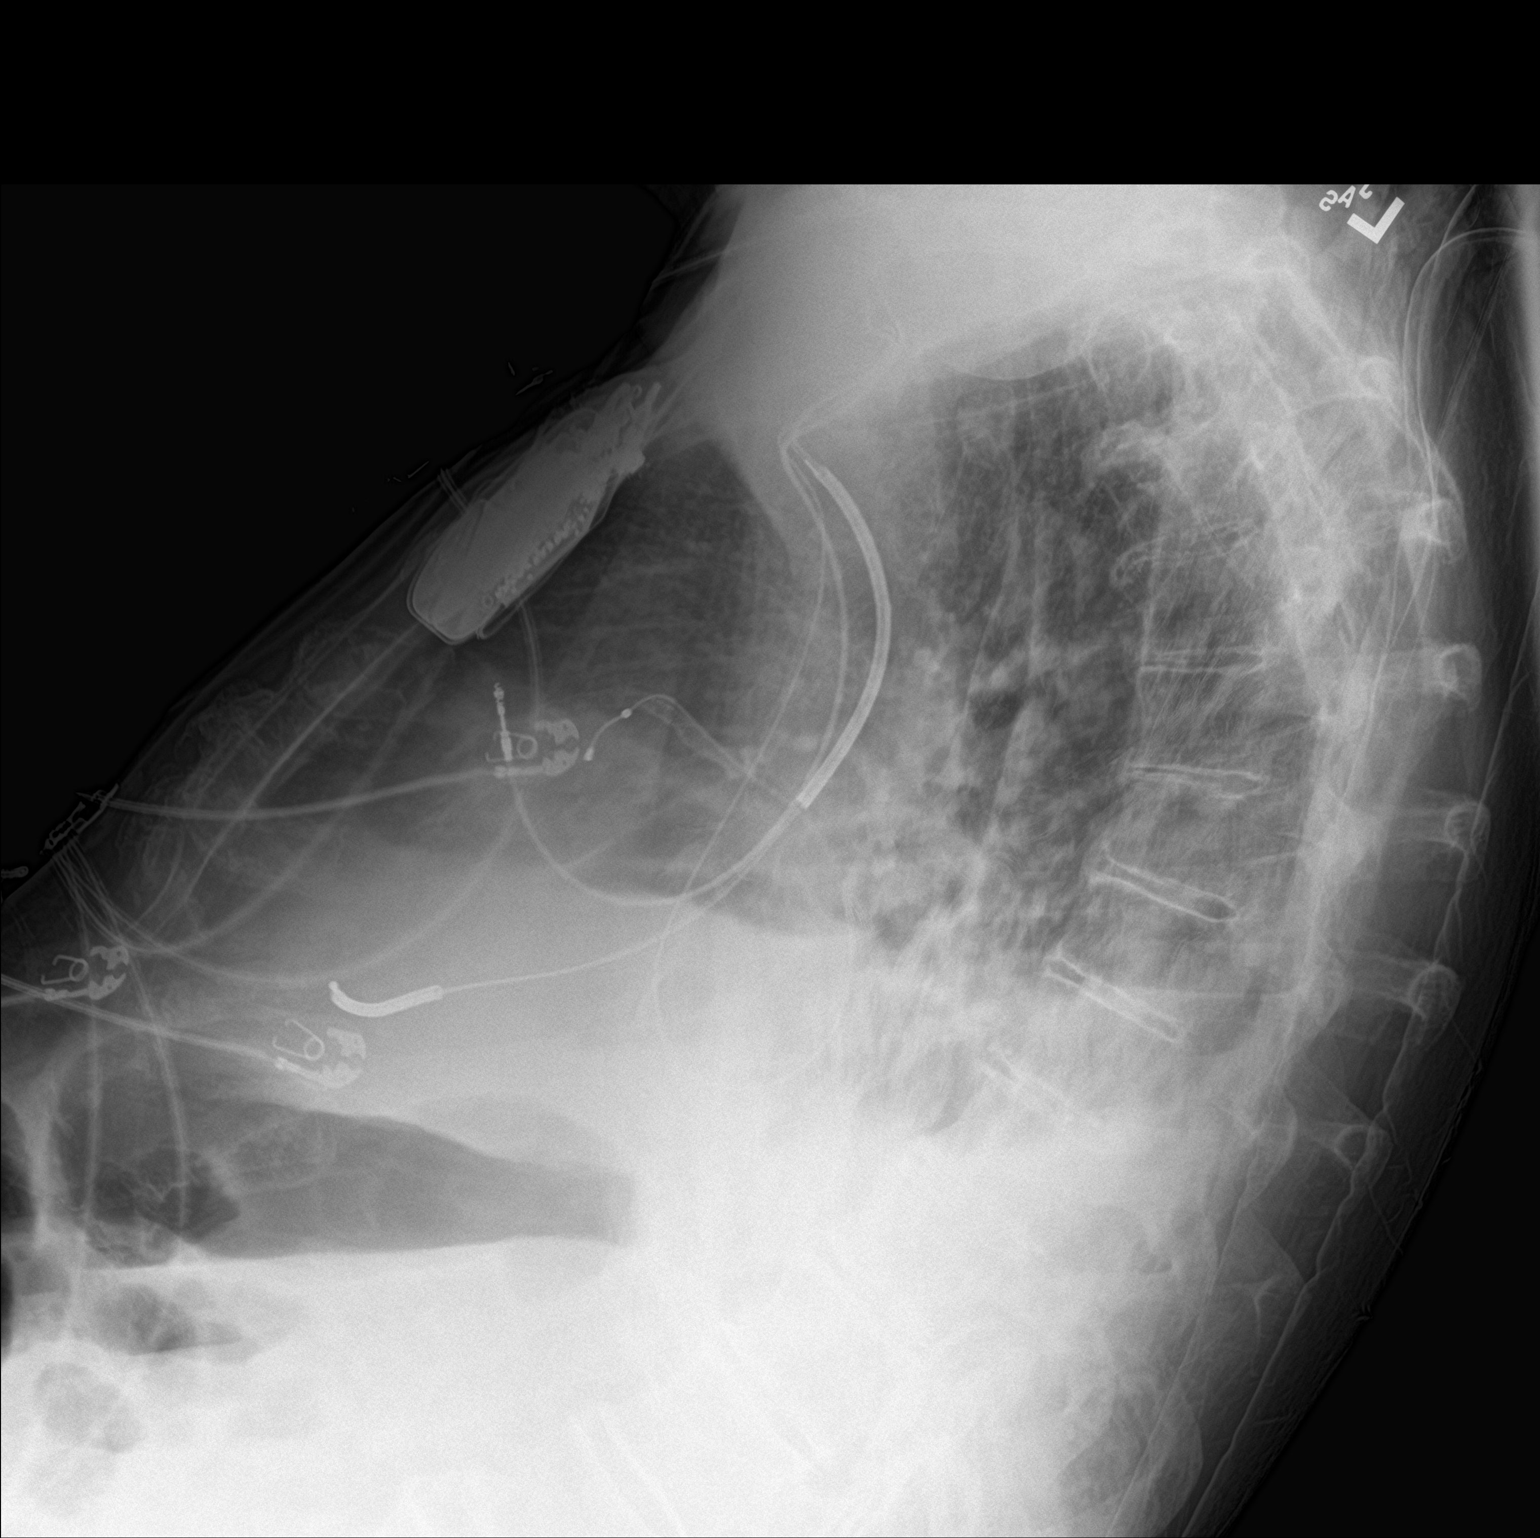

[chest ap]
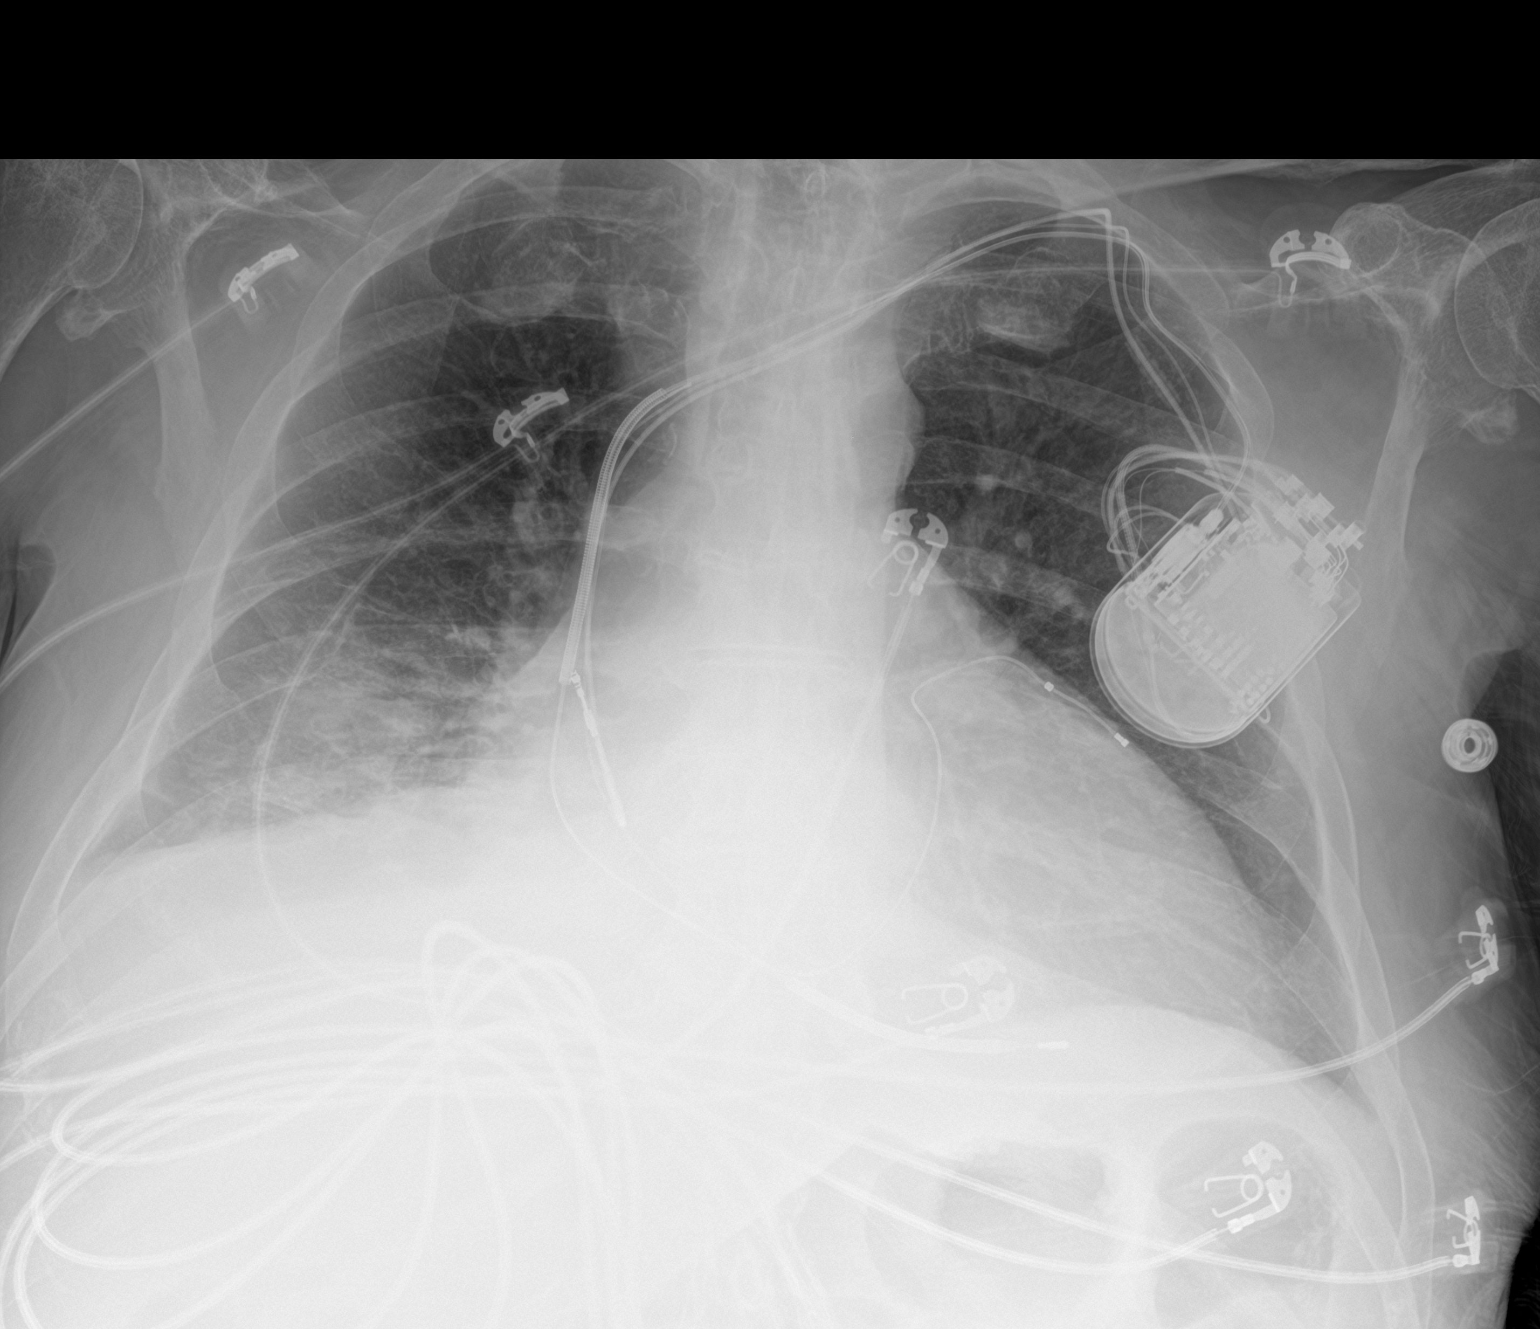

[2 of 2 positions shown; findings below may reference images not displayed]

FINDINGS: Stable cardiomegaly with ICD device projecting over the left
hemithorax and leads in the right atrium, coronary sinus and right
ventricle. Hazy opacity at the right lung base is noted suspicious
for pneumonia and/or layering right effusion. Degenerate changes are
seen about both glenohumeral and AC joints.
IMPRESSION: 1. Hazy densities at the right lung base suspicious for pneumonia
and/or layering effusion.
2. Stable cardiomegaly with ICD device in place.

## 2019-01-14 IMAGING — CR DG CHEST 1V
1 series · 1 of 1 positions shown · non-contrast
Comparison: 09/03/2017; chest CT - 09/03/2017

CLINICAL DATA: Post right-sided thoracentesis

EXAM:
CHEST 1 VIEW

[chest ap]
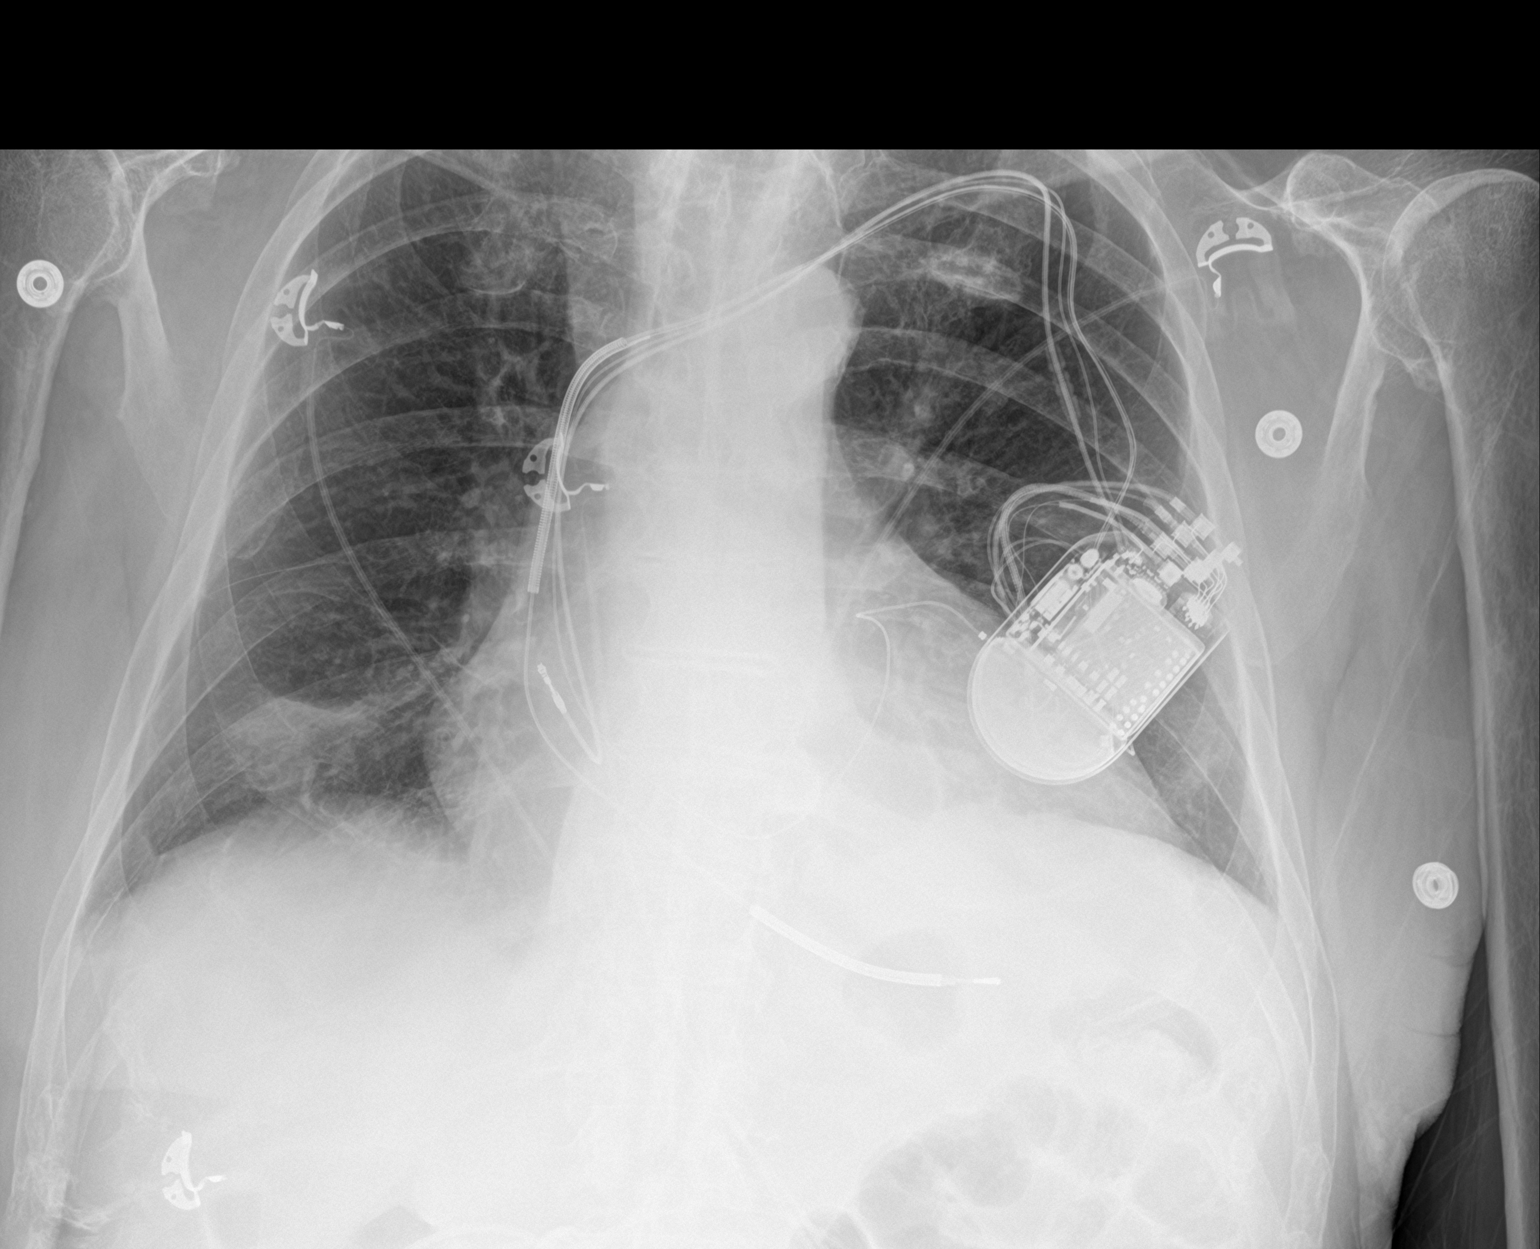

[1 of 1 positions shown; findings below may reference images not displayed]

FINDINGS: Grossly unchanged enlarged cardiac silhouette and mediastinal
contours. Stable position of support apparatus.

Interval reduction / near resolution of right-sided pleural effusion
post thoracentesis. No pneumothorax. A small amount of fluid is seen
tracking within the right major fissure. Overall improved aeration
of the right lung base. Minimal bibasilar opacities favored to
represent atelectasis. No new focal airspace opacities. No evidence
of edema. No acute osseus abnormalities. Degenerative change of the
left glenohumeral joint is suspected though incompletely evaluated.
IMPRESSION: 1. Interval reduction / near resolution of right-sided pleural
effusion post thoracentesis. No pneumothorax.
2. Improved aeration of lung bases with persistent bibasilar
opacities, likely atelectasis.

## 2019-01-16 IMAGING — DX DG CHEST 2V
2 series · 2 of 2 positions shown · non-contrast
Comparison: 09/04/2017 chest x-ray.  09/03/2017 chest CT.

CLINICAL DATA: 60-year-old male with bloody sputum and possible
infection. Shortness breath. Subsequent encounter.

EXAM:
CHEST  2 VIEW

[x chest ap]
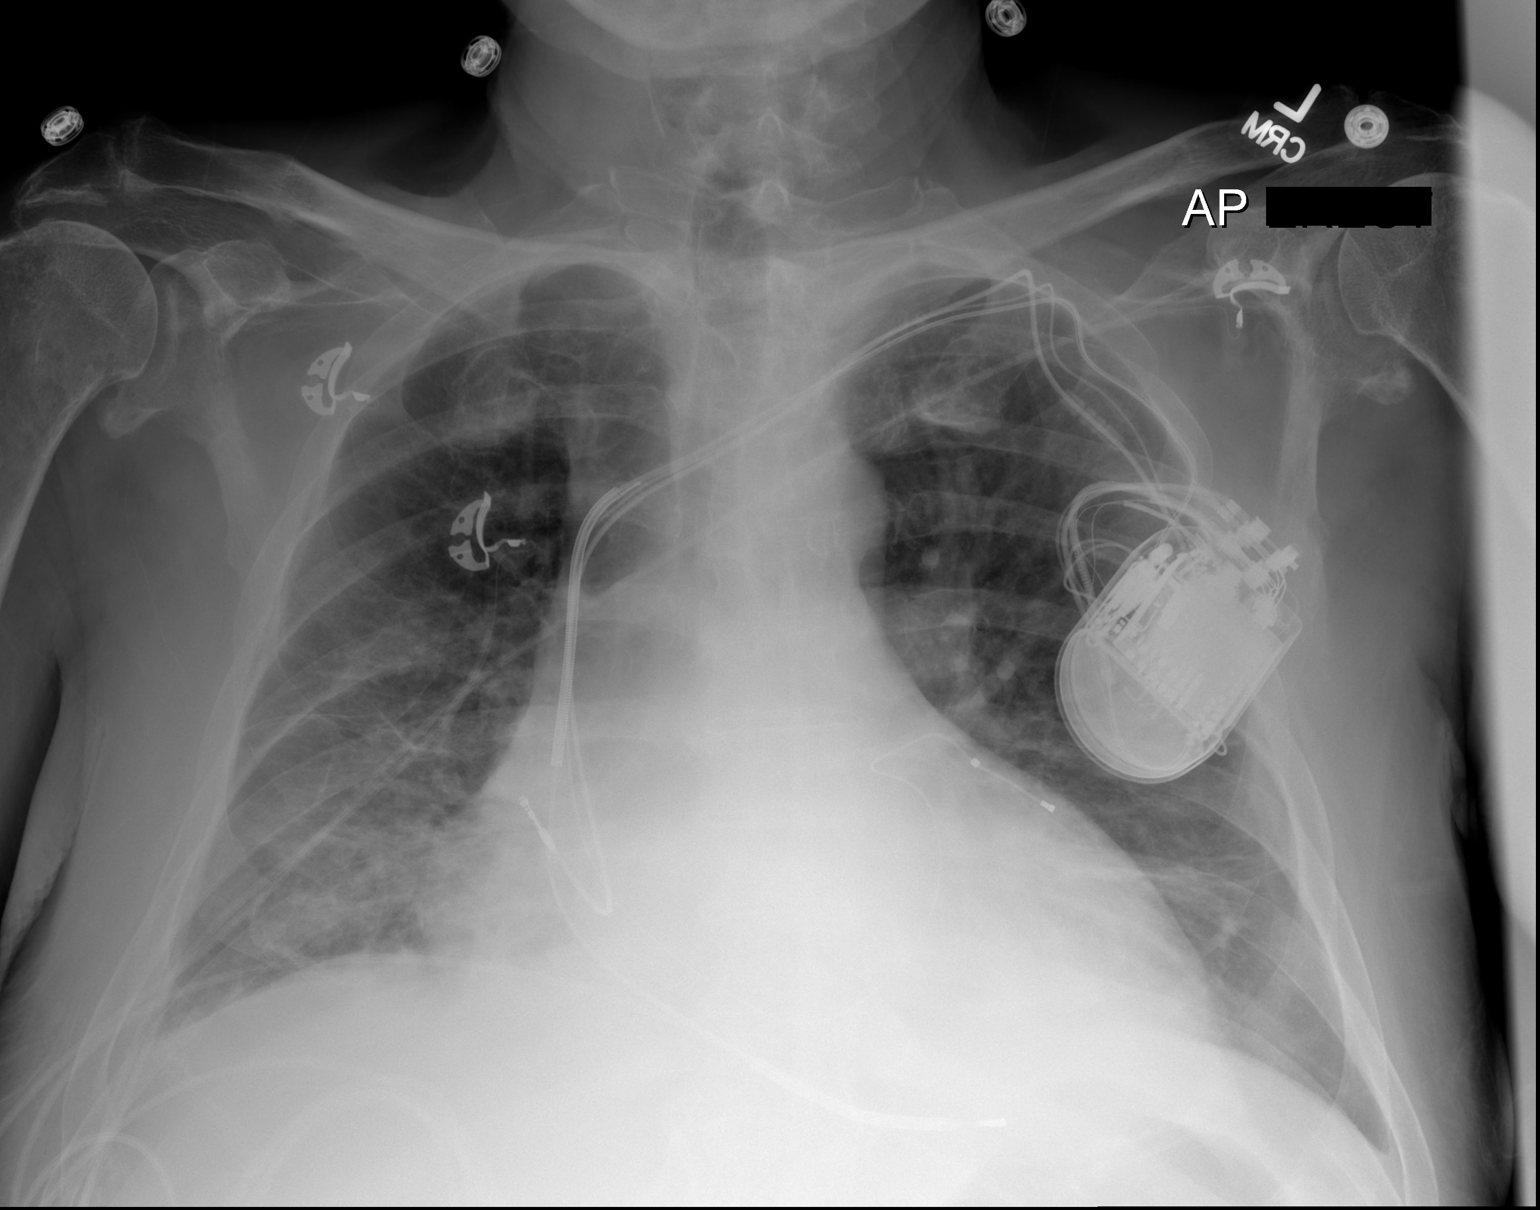

[w chest lat]
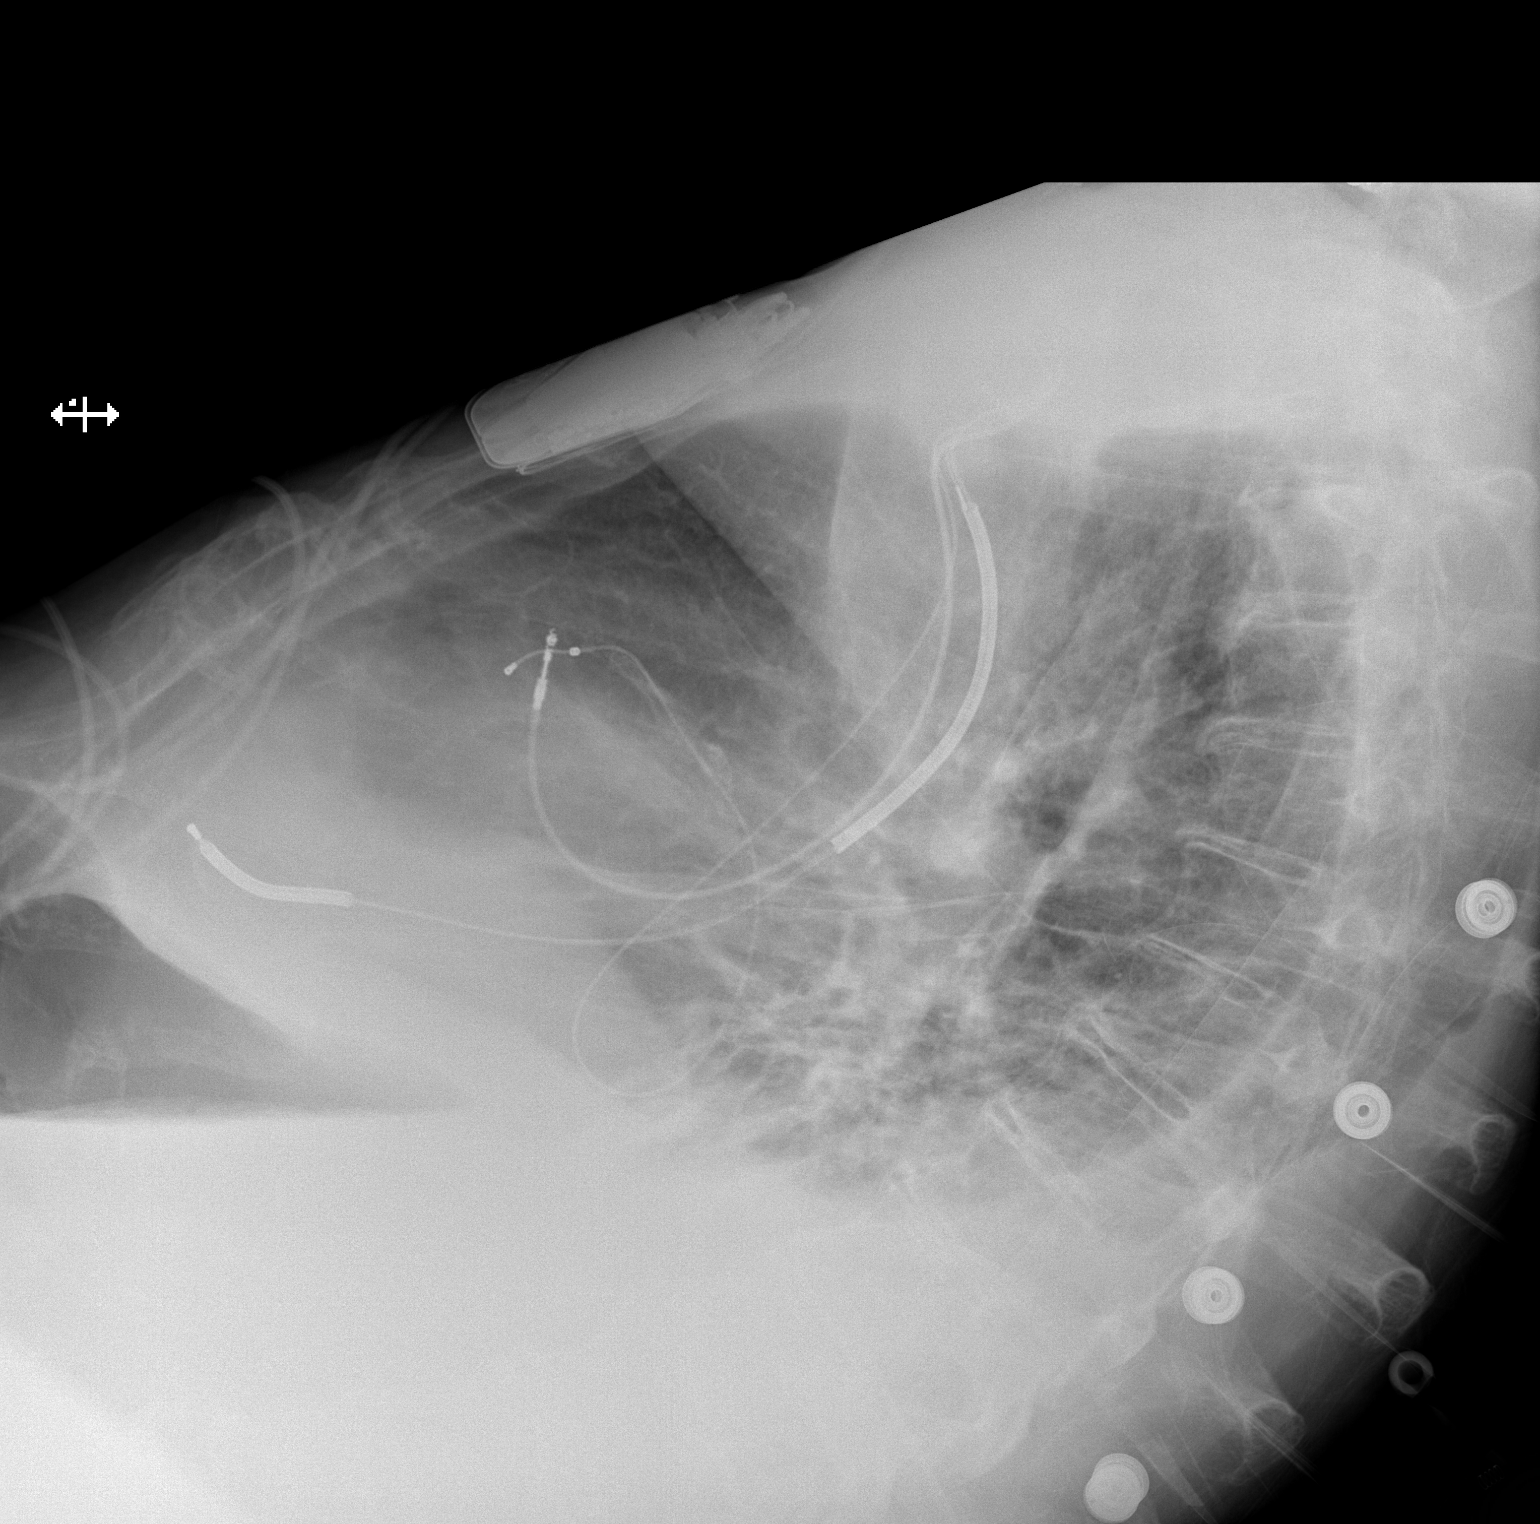

[2 of 2 positions shown; findings below may reference images not displayed]

FINDINGS: AICD/biventricular pacer in place.  Cardiomegaly.

Appearance of worsening airspace disease right lung may represent
progressive mild pulmonary edema versus shifting of posteriorly
layering pleural effusion.

Right middle lobe consolidation as noted on recent CT.
IMPRESSION: Appearance of worsening airspace disease right lung may represent
progressive mild pulmonary edema versus shifting of posteriorly
layering pleural effusion.

Right middle lobe consolidation as noted on recent CT.

Cardiomegaly.

## 2019-01-18 IMAGING — CR DG CHEST 2V
2 series · 2 of 2 positions shown · non-contrast
Comparison: 09/06/2017

CLINICAL DATA: Pleural effusion

EXAM:
CHEST  2 VIEW

[chest lat]
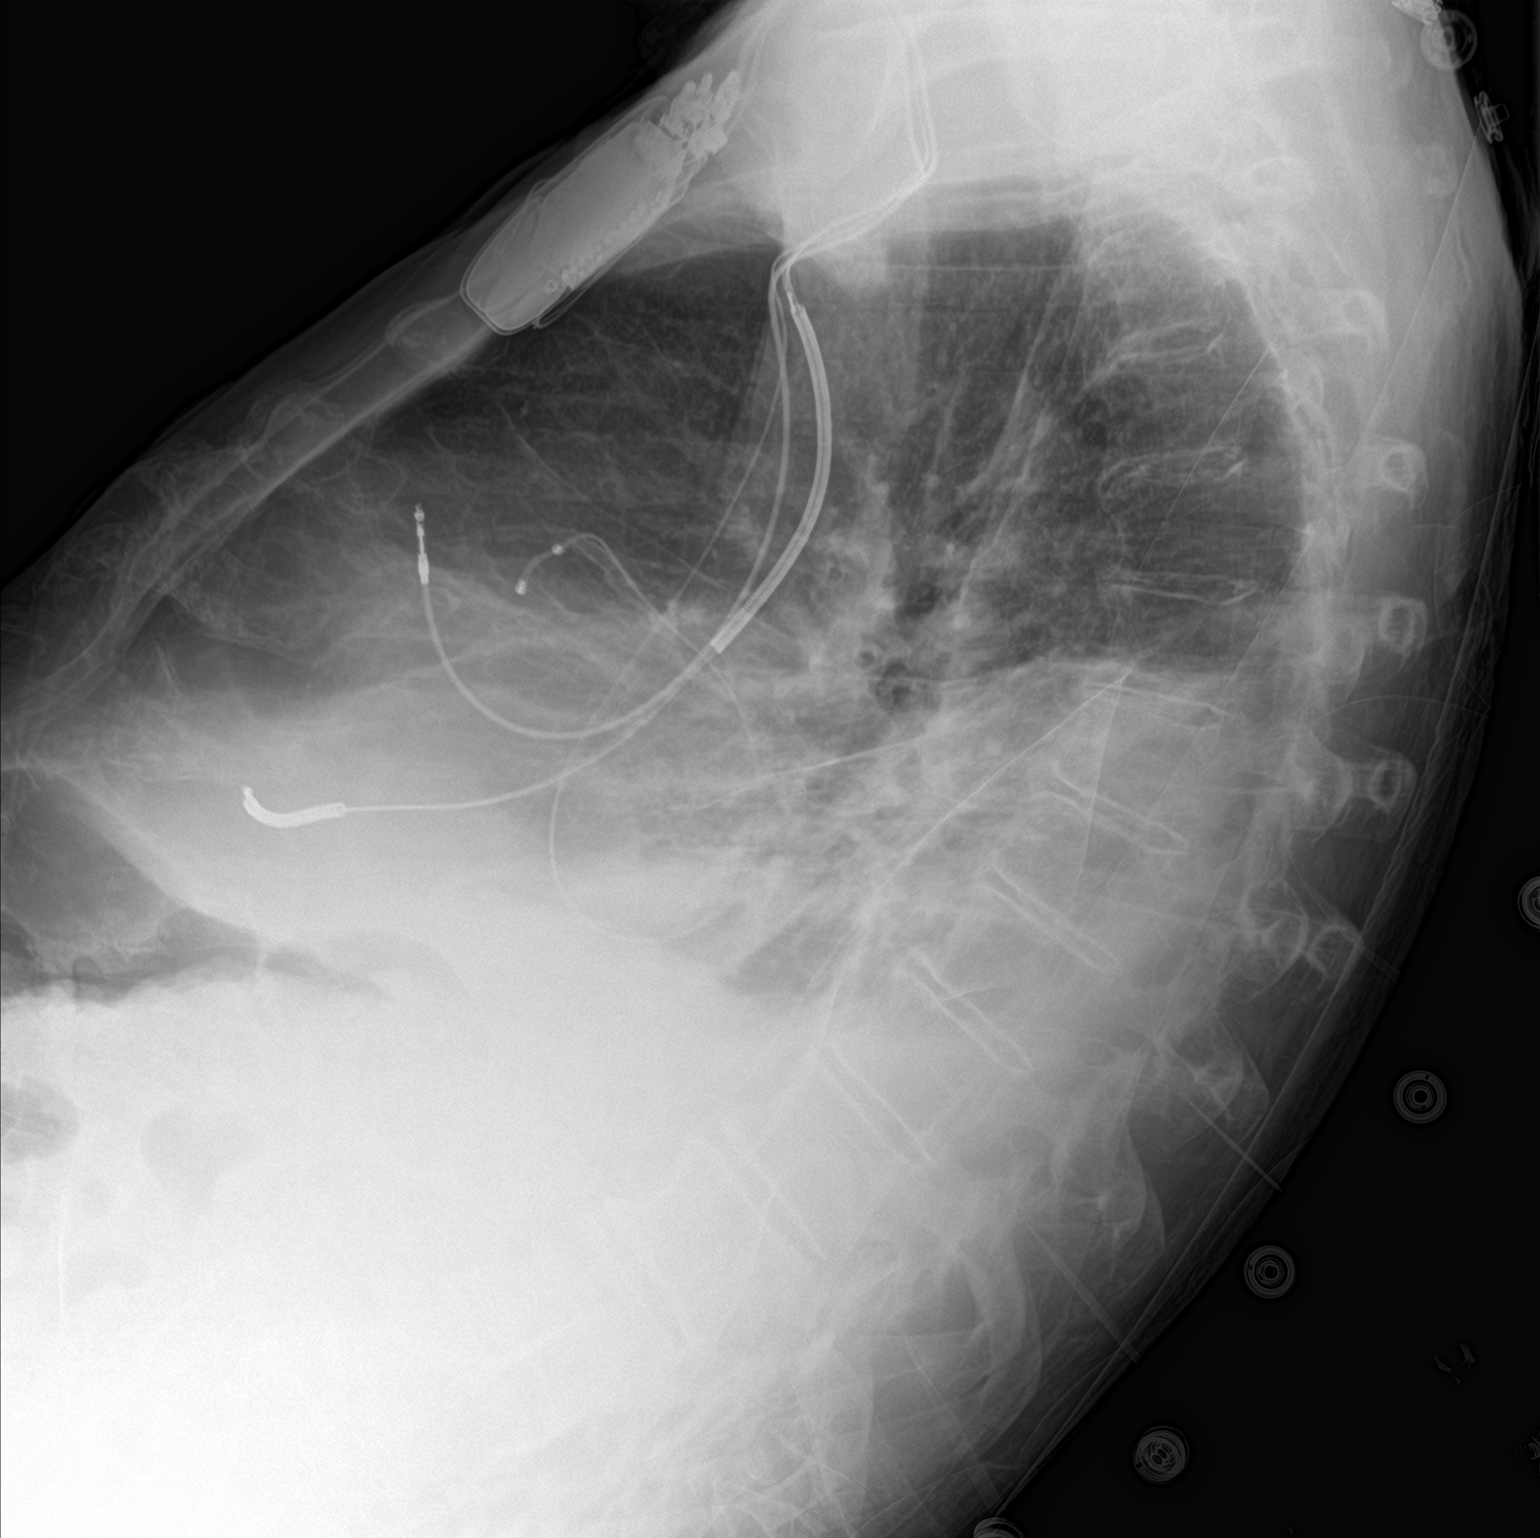

[chest ap]
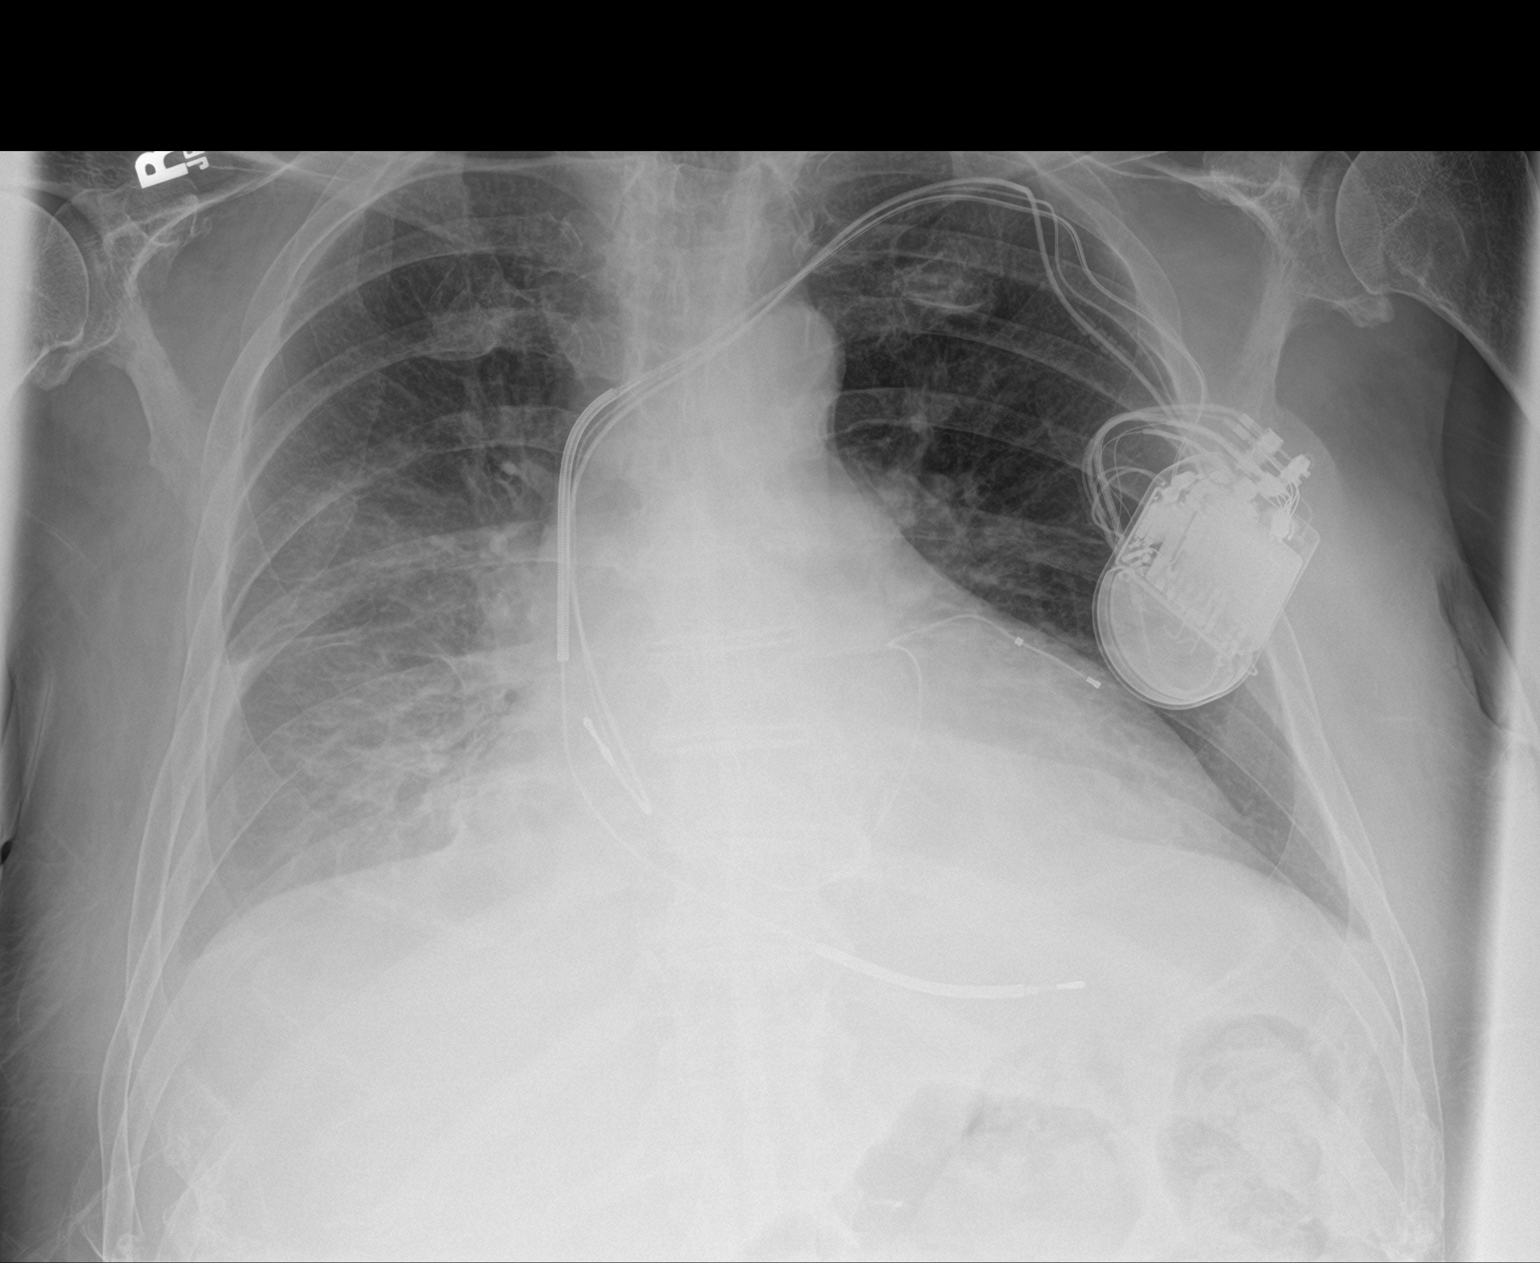

[2 of 2 positions shown; findings below may reference images not displayed]

FINDINGS: Small to moderate layering right pleural effusion. Associated right
lower lobe opacity, likely atelectasis. Left lung is clear. No
pneumothorax.

Cardiomegaly.  Left chest ICD.

Mild degenerative changes of the visualized thoracolumbar spine.
IMPRESSION: Small to moderate layering right pleural effusion.

Associated right lower lobe opacity, likely atelectasis.

## 2019-01-22 IMAGING — RF DG SWALLOWING FUNCTION - [facility]
1 series · 18 of 24 positions shown · non-contrast
Comparison: none

[Series 1: run · 37 acquisitions, 18 frames shown]
[im 1/37]
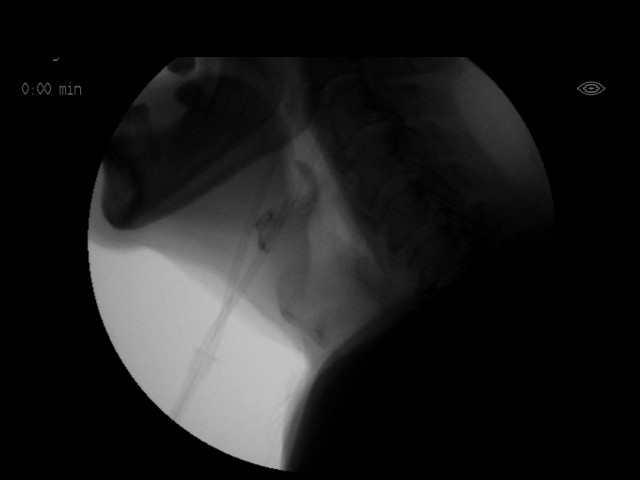
[im 4/37]
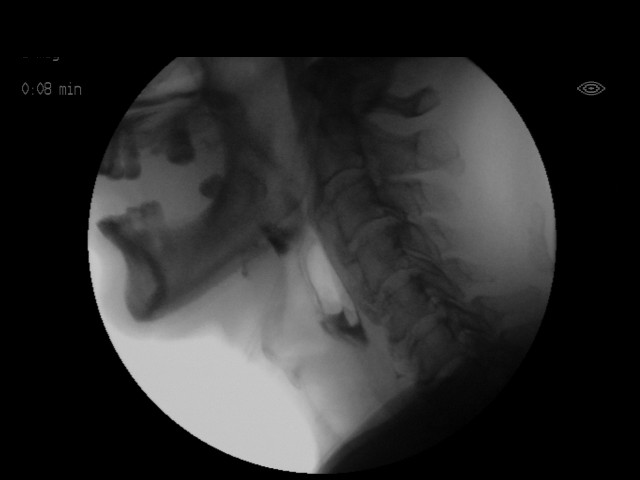
[im 5/37]
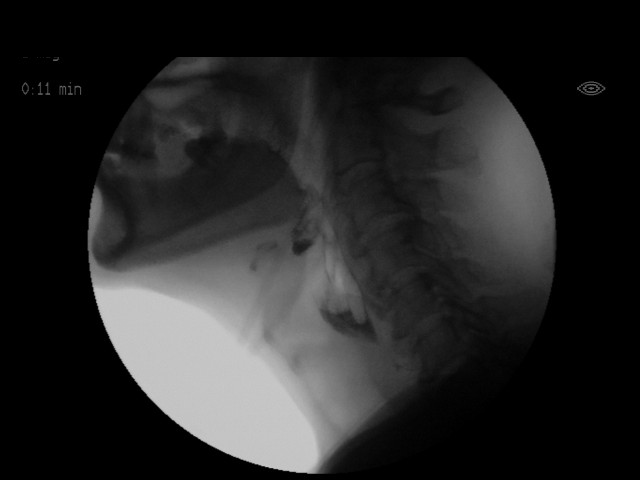
[im 7/37]
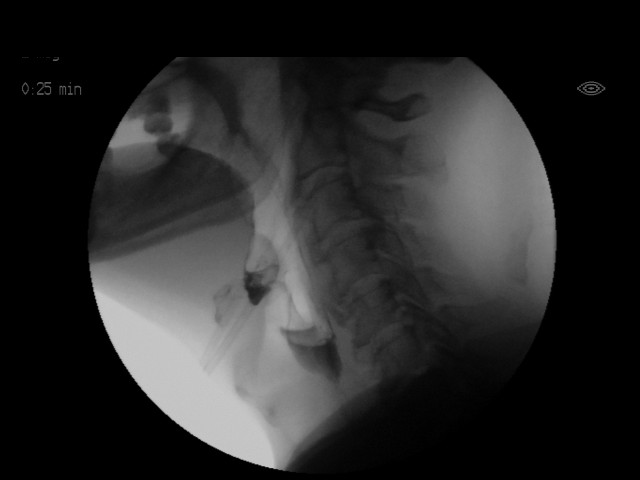
[im 10/37]
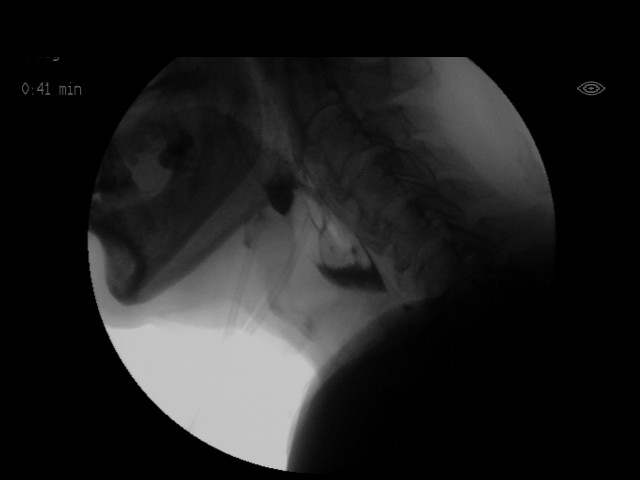
[im 11/37]
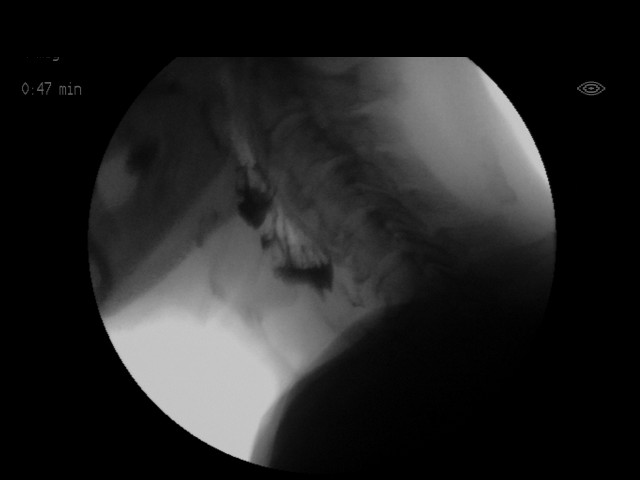
[im 13/37]
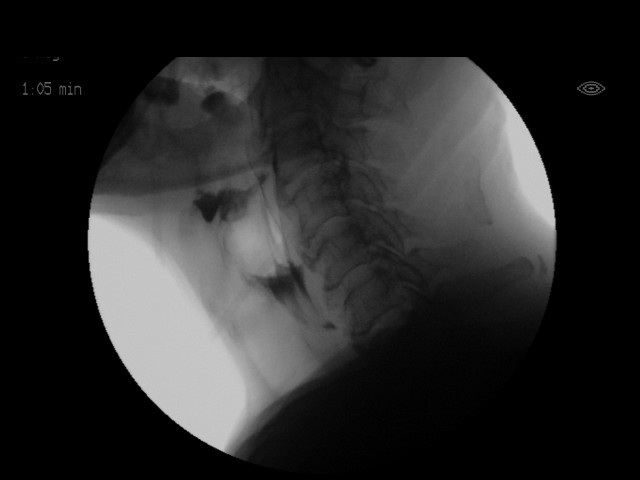
[im 16/37]
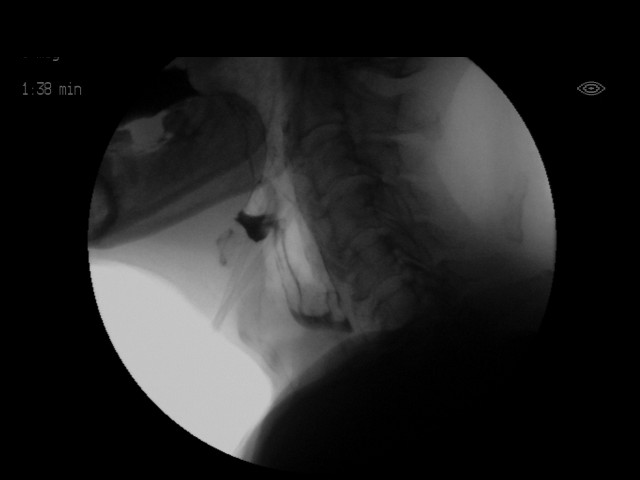
[im 18/37]
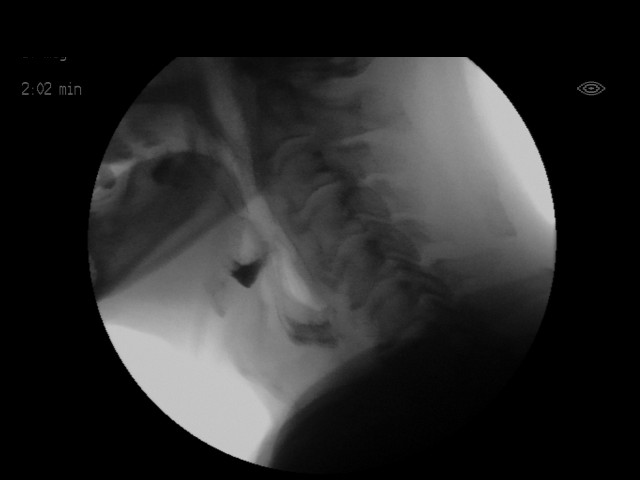
[im 19/37]
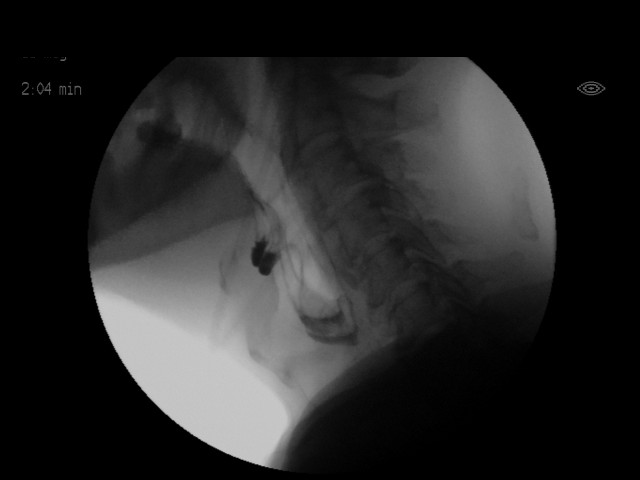
[im 22/37]
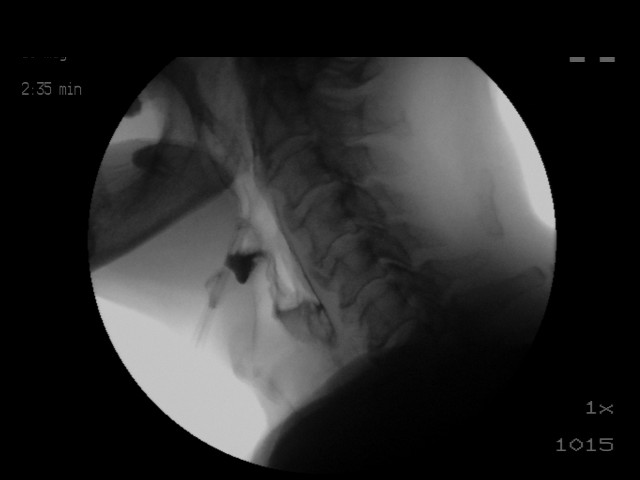
[im 24/37]
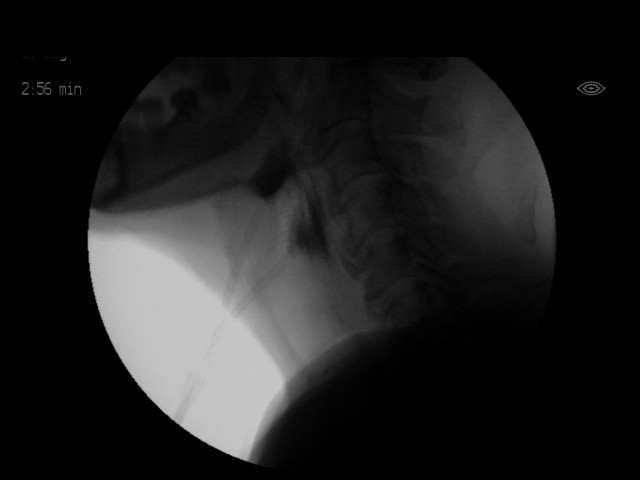
[im 26/37]
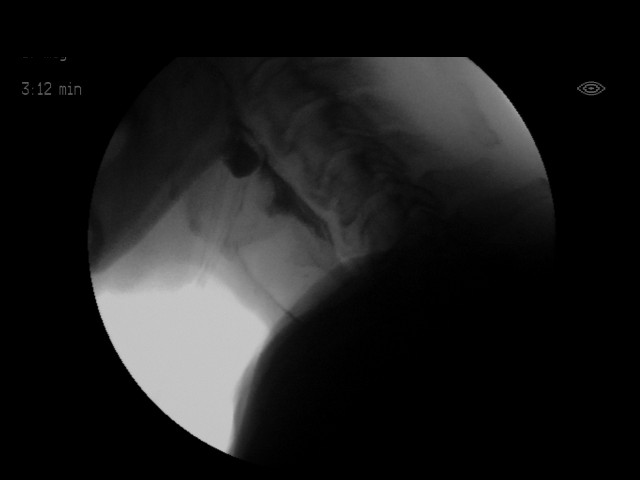
[im 29/37]
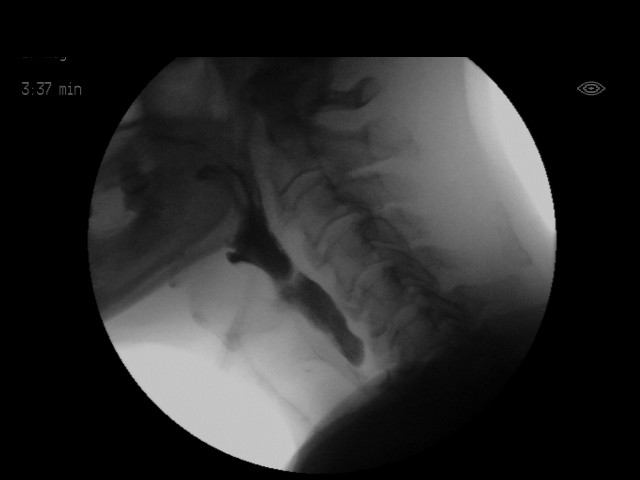
[im 30/37]
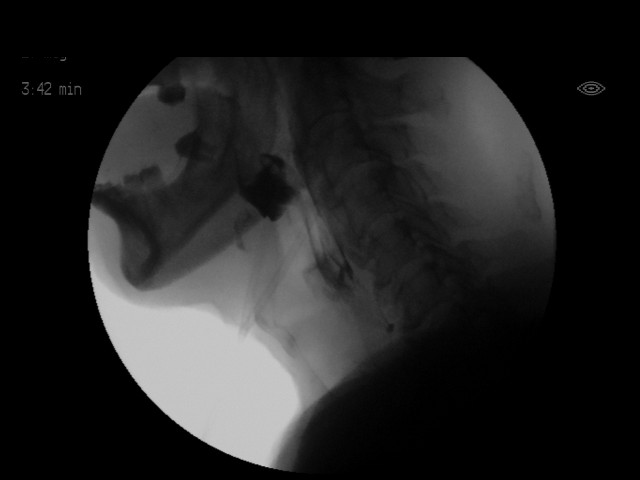
[im 32/37]
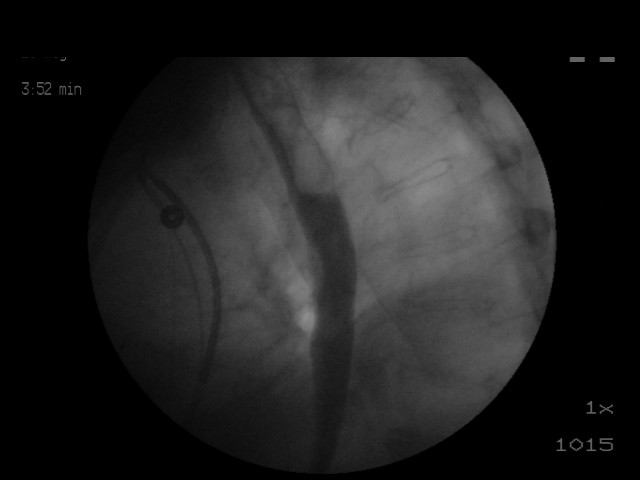
[im 35/37]
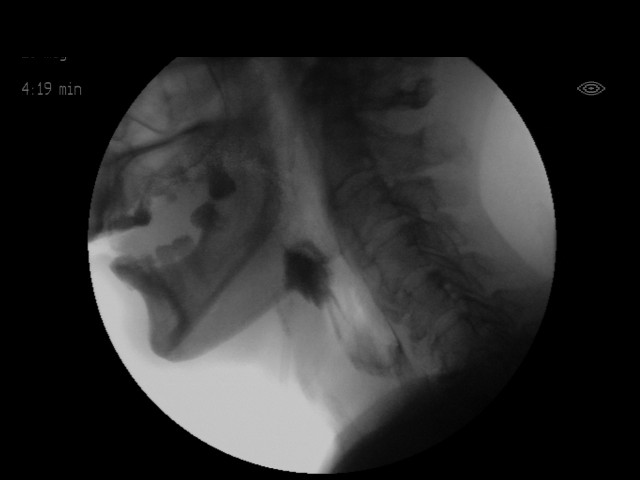
[im 37/37]
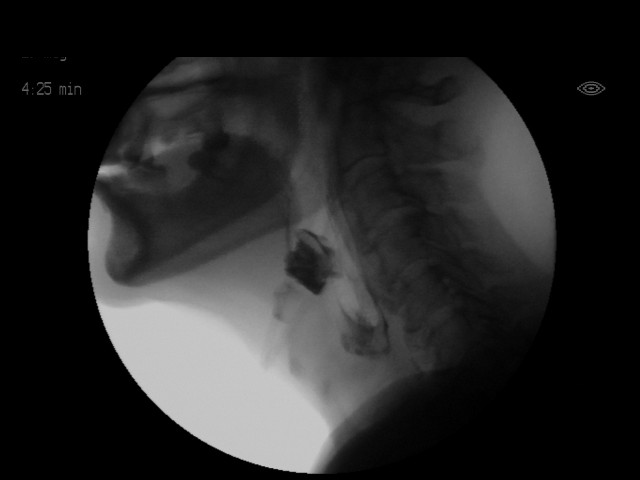

[18 of 24 positions shown; findings below may reference images not displayed]

FLUOROSCOPY FOR SWALLOWING FUNCTION STUDY:
Fluoroscopy was provided for swallowing function study, which was administered by a speech pathologist.  Final results and recommendations from this study are contained within the speech pathology report.

## 2019-03-19 IMAGING — CR DG CHEST 1V
1 series · 1 of 1 positions shown · non-contrast
Comparison: Radiographs October 26, 2017.

CLINICAL DATA: Status post right thoracentesis.

EXAM:
CHEST 1 VIEW

[chest ap]
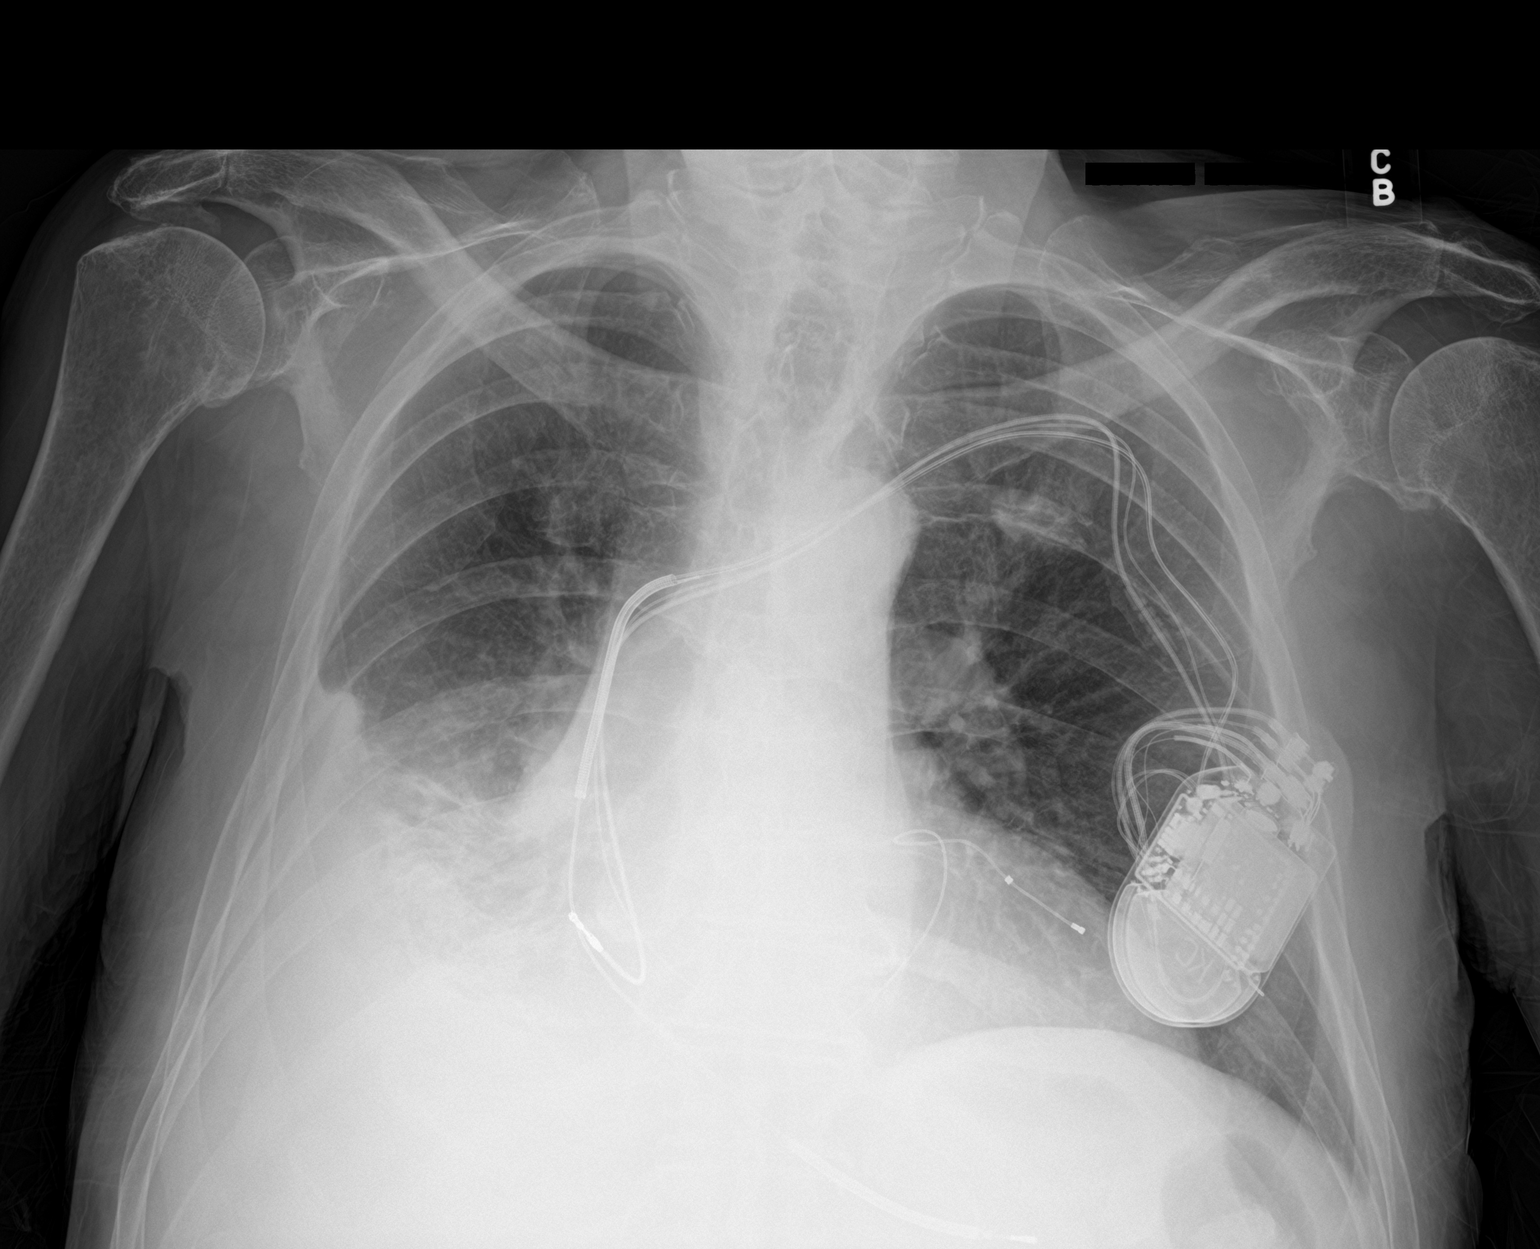

[1 of 1 positions shown; findings below may reference images not displayed]

FINDINGS: The heart size and mediastinal contours are within normal limits.
Left lung is clear. Right pleural effusion is significantly smaller.
No definite pneumothorax is noted. Left-sided pacemaker is unchanged
in position. The visualized skeletal structures are unremarkable.
IMPRESSION: Right pleural effusion is significantly smaller status post
thoracentesis. No definite pneumothorax is noted.

## 2019-04-19 ENCOUNTER — Ambulatory Visit: Payer: Medicare Other | Admitting: Nurse Practitioner

## 2019-07-12 IMAGING — US US THORACENTESIS ASP PLEURAL SPACE W/IMG GUIDE
1 series · 3 of 3 positions shown · non-contrast
Comparison: Chest CT - 09/03/2017;

INDICATION: Symptomatic right sided pleural effusion

EXAM:
US THORACENTESIS ASP PLEURAL SPACE W/IMG GUIDE
TECHNIQUE: Informed written consent was obtained from the patient after a
discussion of the risks, benefits and alternatives to treatment. A
timeout was performed prior to the initiation of the procedure.

[Series 1: us thoracentesis asp pleural space w/img guide · 0.28mm/px · 3 of 3 slices shown]
[im 1/3]
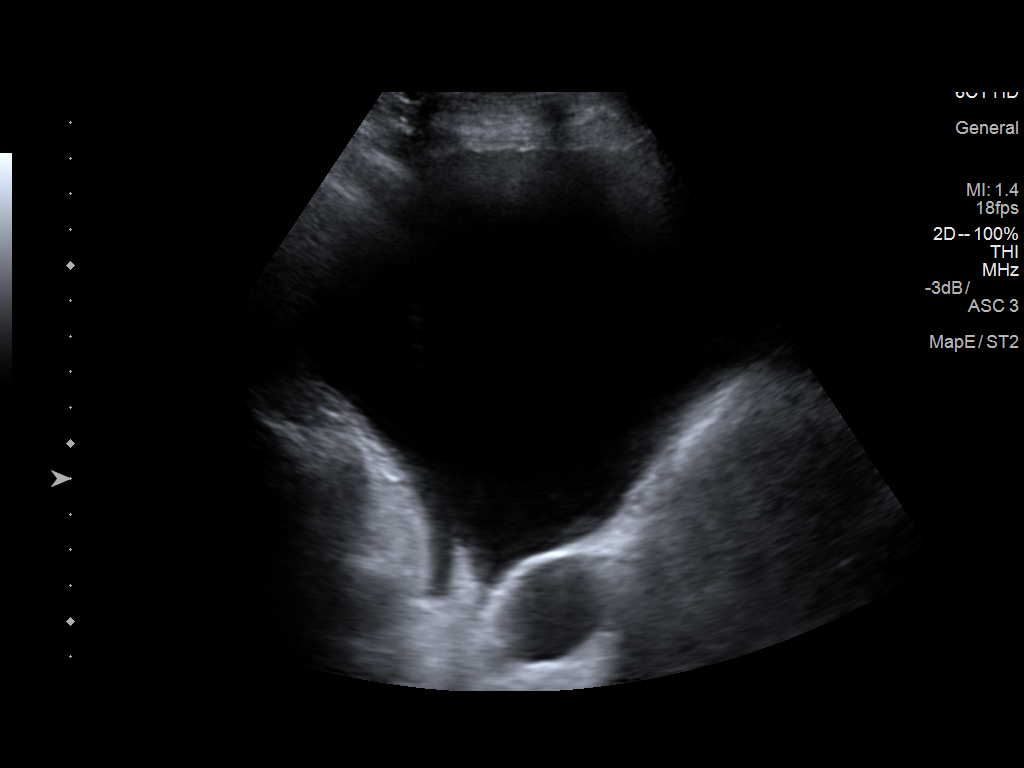
[im 2/3]
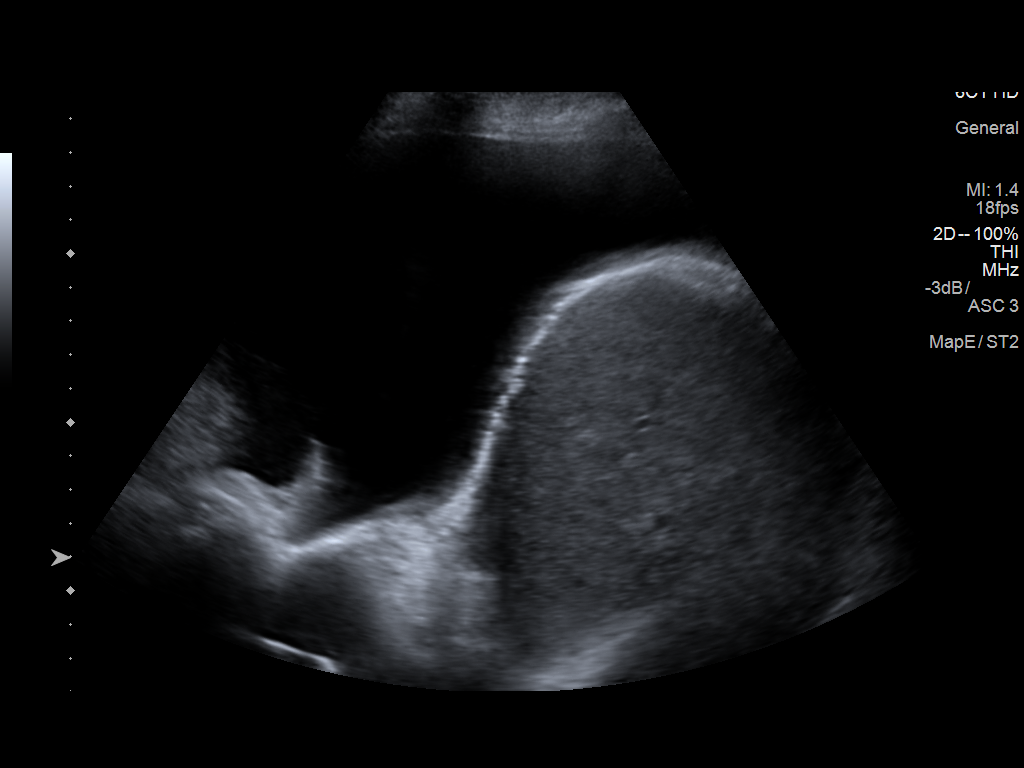
[im 3/3]
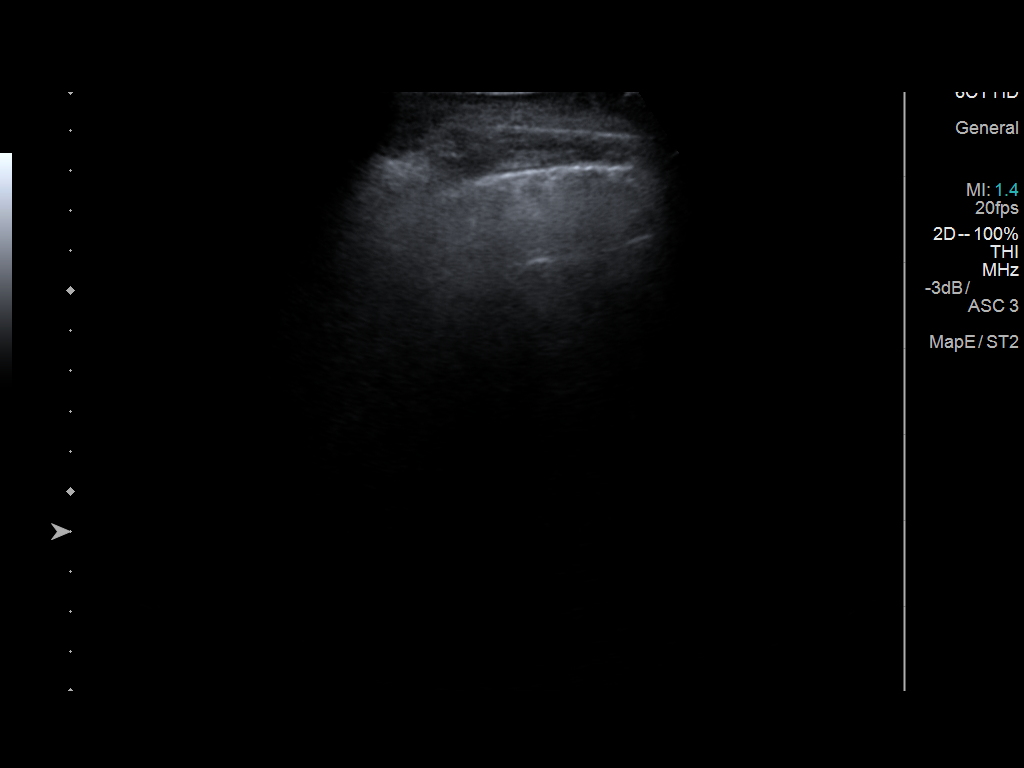

[3 of 3 positions shown; findings below may reference images not displayed]

chest radiograph - 09/03/2017;
07/24/2017

MEDICATIONS:
None.

COMPLICATIONS:
None immediate.
Initial ultrasound scanning demonstrates a large anechoic
right-sided pleural effusion. The lower chest was prepped and draped
in the usual sterile fashion. 1% lidocaine was used for local
anesthesia. An ultrasound image was saved for documentation
purposes. An 8 Fr Safe-T-Centesis catheter was introduced. The
thoracentesis was performed. The catheter was removed and a dressing
was applied. The patient tolerated the procedure well without
immediate post procedural complication. The patient was escorted to
have an upright chest radiograph.
FINDINGS: A total of approximately 1.8 liters of serous fluid was removed.
Requested samples were sent to the laboratory.
IMPRESSION: Successful ultrasound-guided right sided thoracentesis yielding
liters of pleural fluid.

## 2019-10-04 IMAGING — DX DG CHEST 2V
2 series · 2 of 2 positions shown · non-contrast
Comparison: One-view chest x-ray 05/04/2018.

CLINICAL DATA: Progressive dyspnea.

EXAM:
CHEST - 2 VIEW

[chest pa]
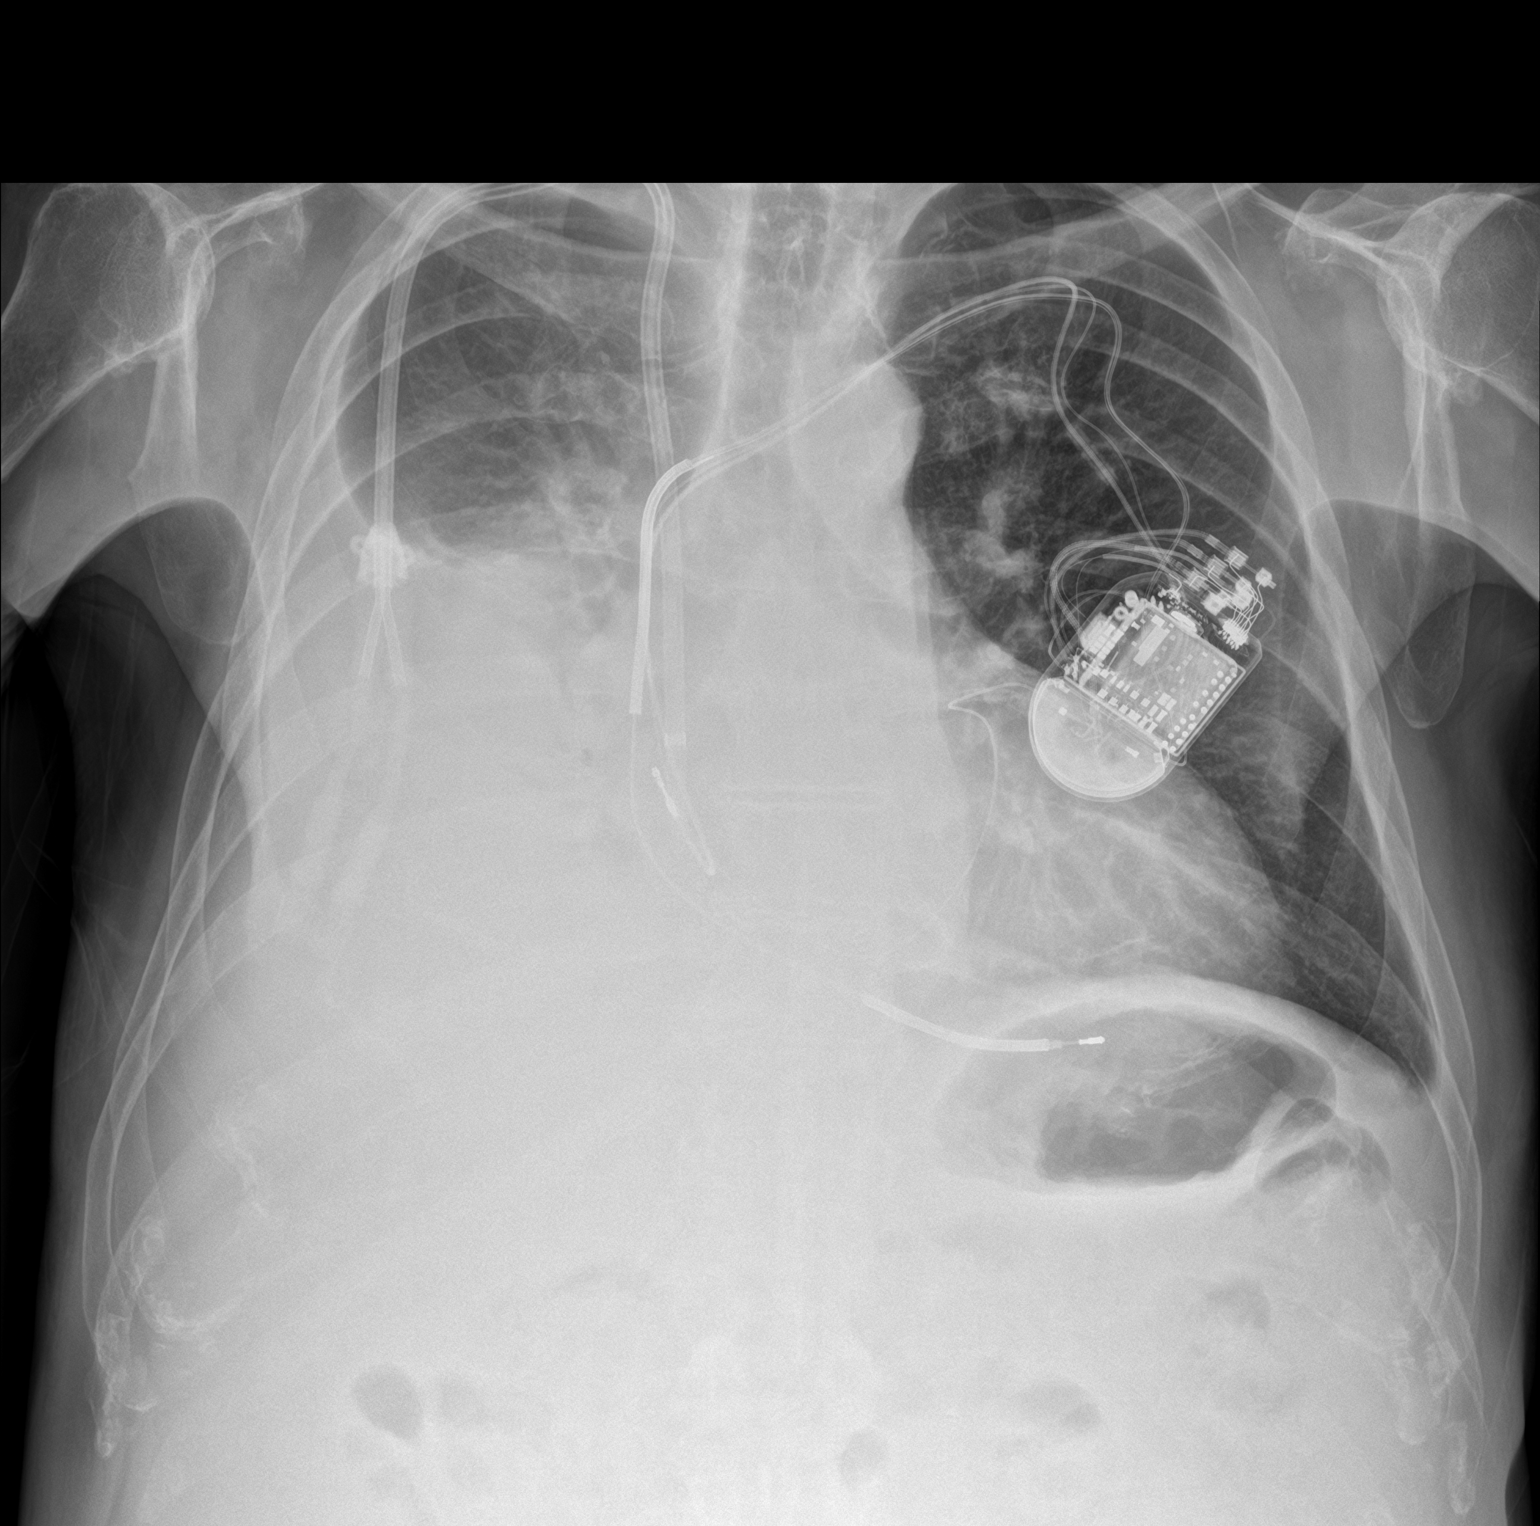

[chest lat]
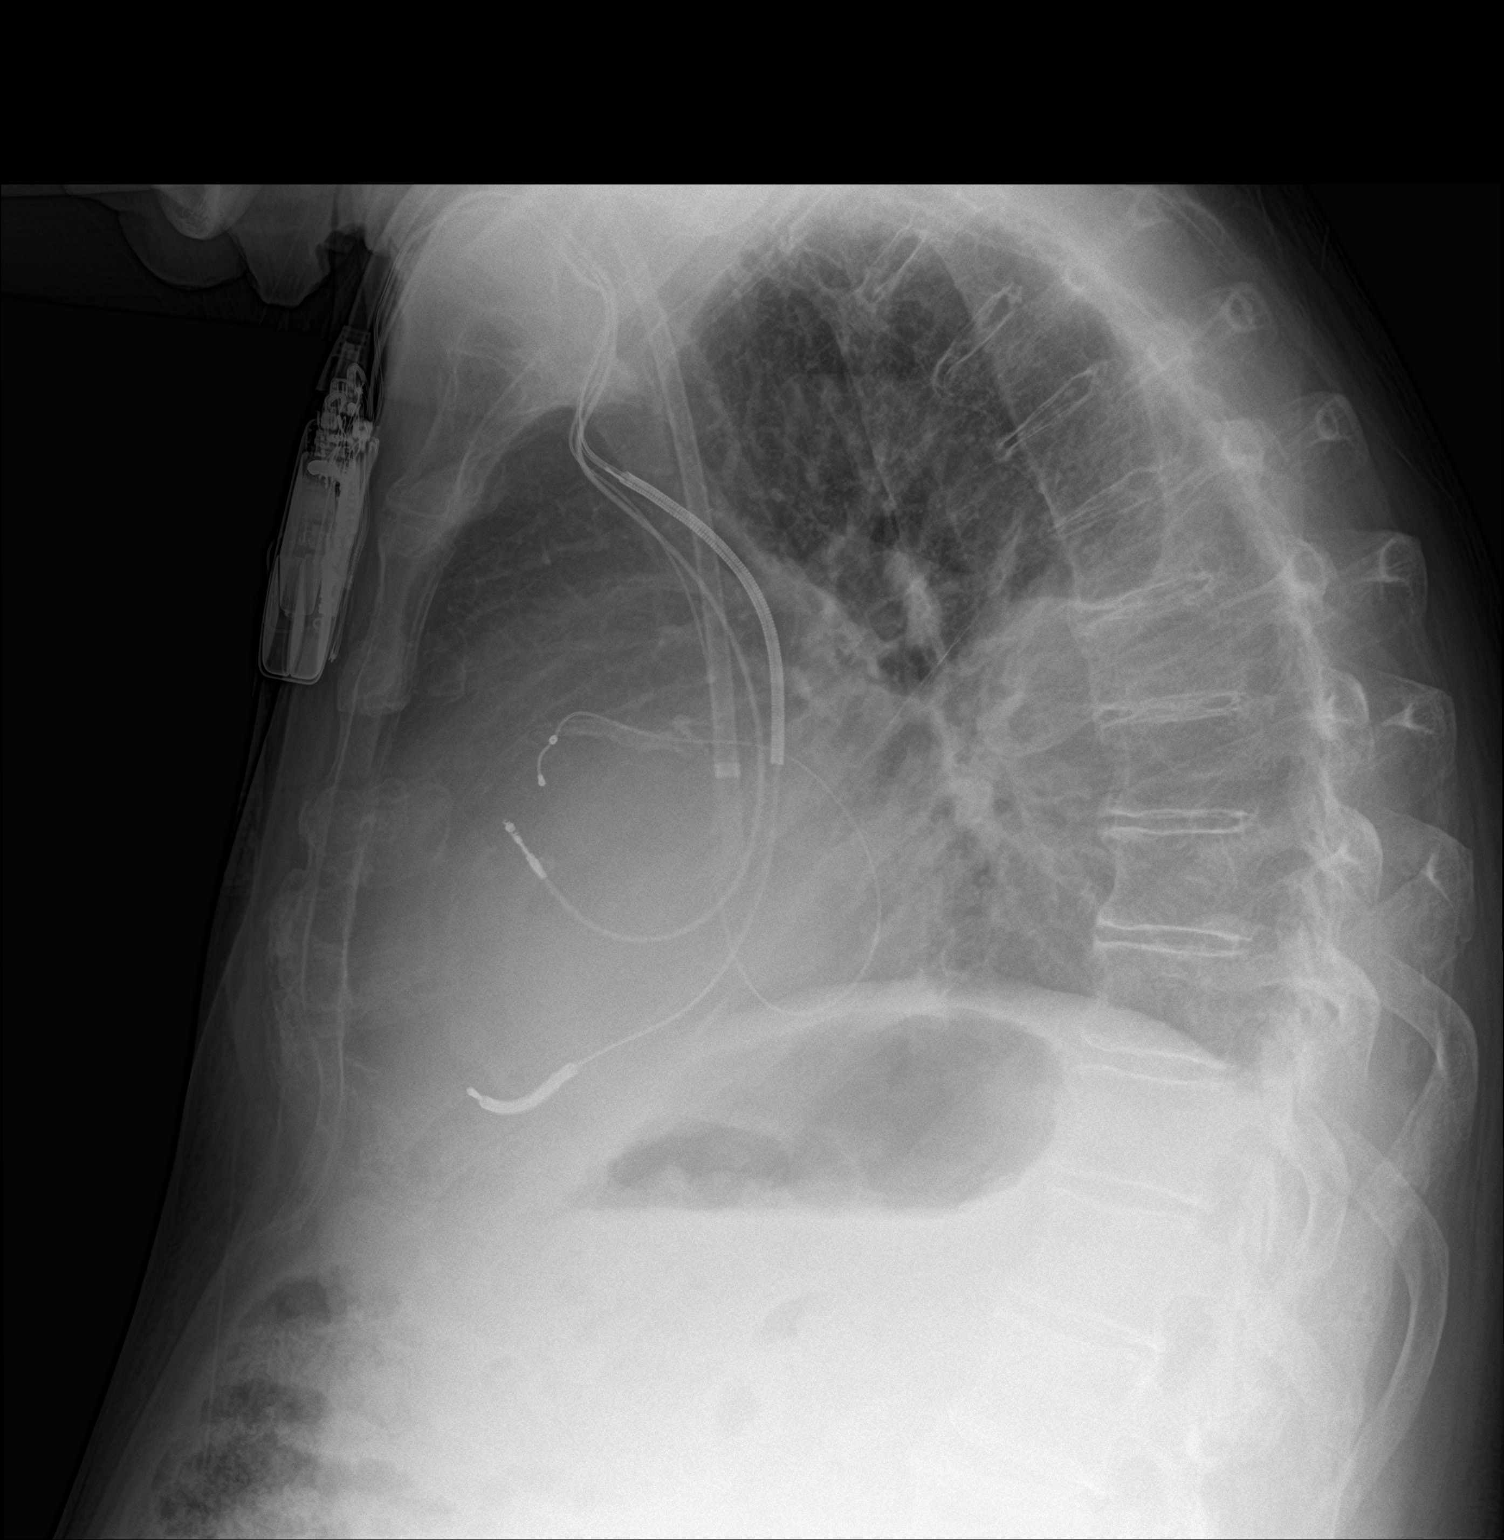

[2 of 2 positions shown; findings below may reference images not displayed]

FINDINGS: AICD is stable. The heart is enlarged. Previously noted pulmonary
vascular congestion has resolved. A right IJ dialysis catheter is in
place. A large right pleural effusion is again seen. Upper lung
field on the right has improved aeration compared to the prior
study. The visualized soft tissues and bony thorax are unremarkable.
IMPRESSION: 1. Large right pleural effusion.
2. Opacification of the right lung base with possible air
bronchograms. Underlying infection or mass is not excluded.
3. Decreased pulmonary vascular congestion and improved aeration in
the right upper lobe not obscured by the fluid.
4. Stable cardiomegaly.

## 2019-10-09 IMAGING — DX DG CHEST 1V
1 series · 1 of 1 positions shown · non-contrast
Comparison: [facility] chest x-ray May 25, 2018

CLINICAL DATA: Status post right-sided thoracentesis

EXAM:
CHEST  1 VIEW

[x chest ap]
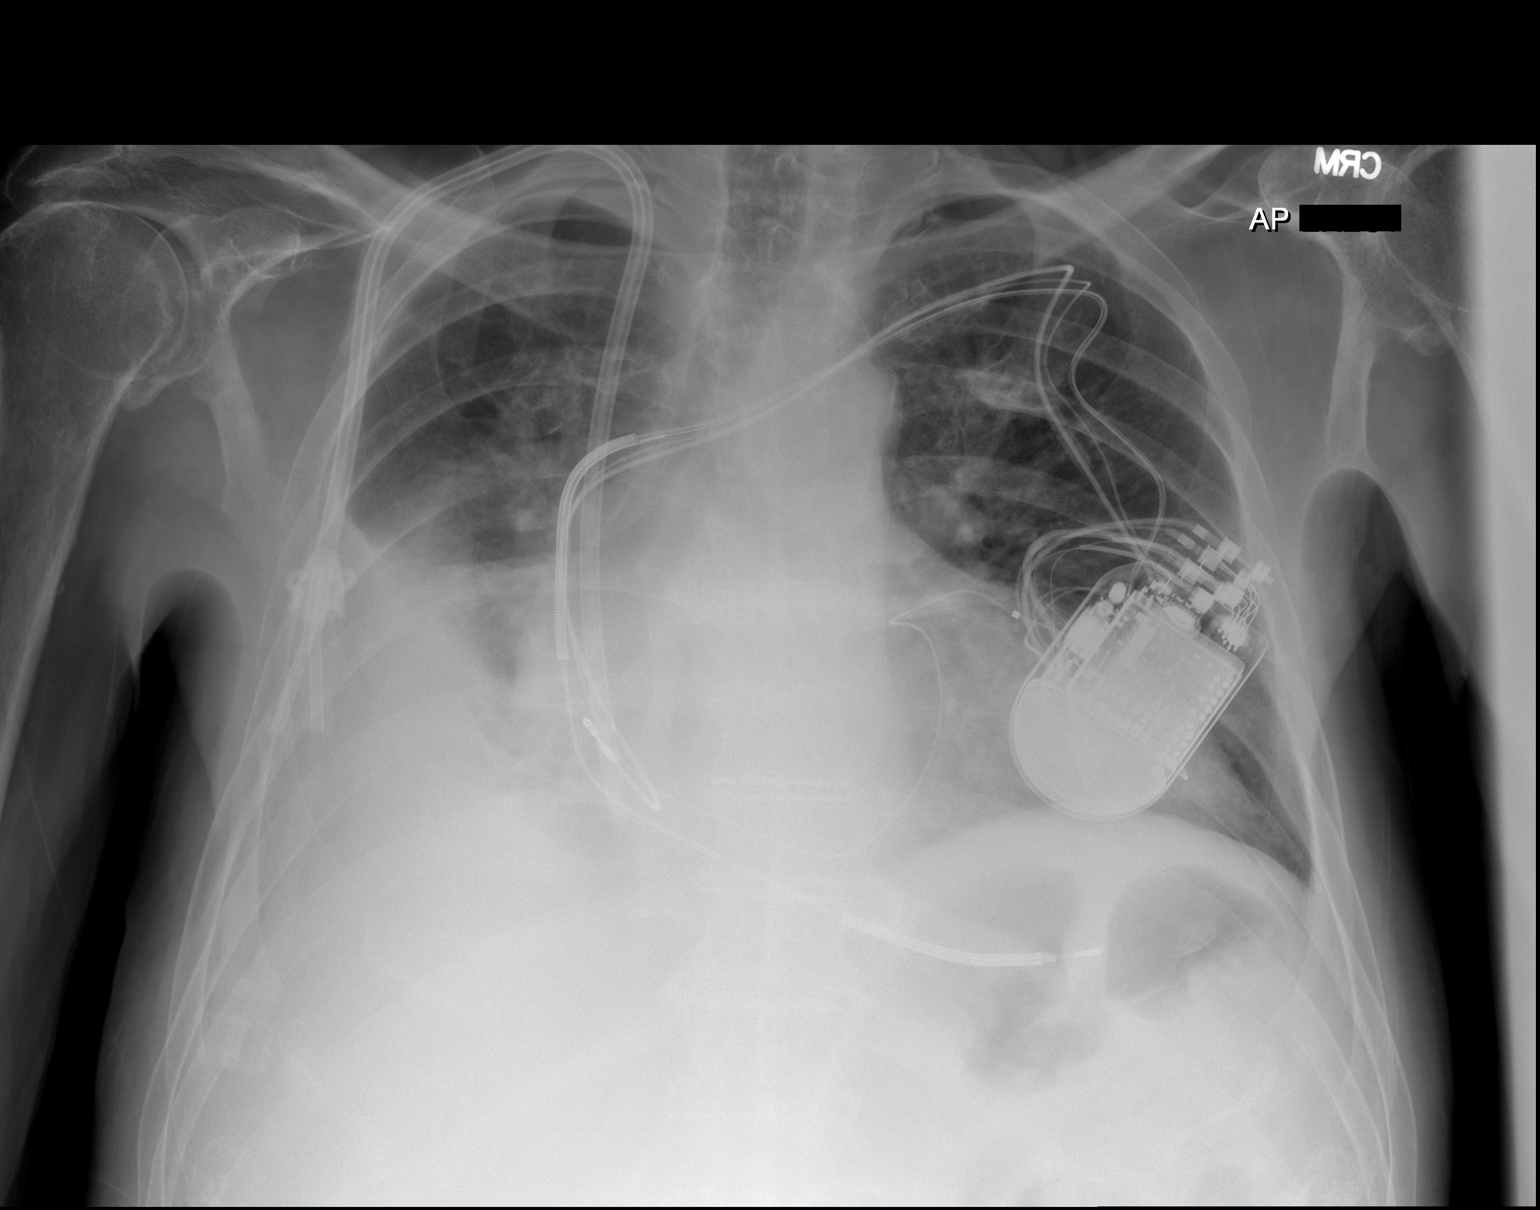

[1 of 1 positions shown; findings below may reference images not displayed]

FINDINGS: There remains a moderate-sized right pleural effusion. There is no
postprocedure pneumothorax. The left lung is adequately inflated.
There is no significant left pleural effusion. The cardiac
silhouette is enlarged. The ICD is in stable position. The dialysis
catheter tip projects over the cavoatrial junction.
IMPRESSION: No postprocedure complication following right-sided thoracentesis. A
moderate-sized right pleural effusion persists.

## 2019-10-09 IMAGING — US IR THORACENTESIS ASP PLEURAL SPACE W/IMG GUIDE
1 series · 3 of 3 positions shown · non-contrast
Comparison: none

INDICATION: Patient with history of renal failure, recurrent pleural effusion.
Patient presents for therapeutic thoracentesis.

[Series 1: ir (id) (id)/(id)/(id) ir · 3 of 3 slices shown]
[im 1/3]
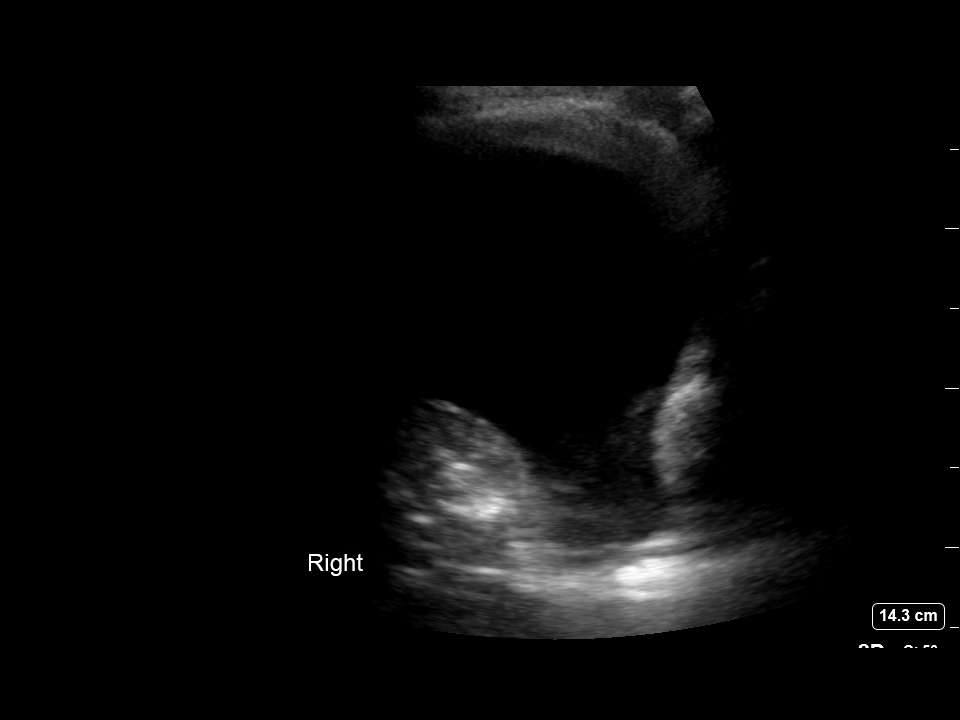
[im 2/3]
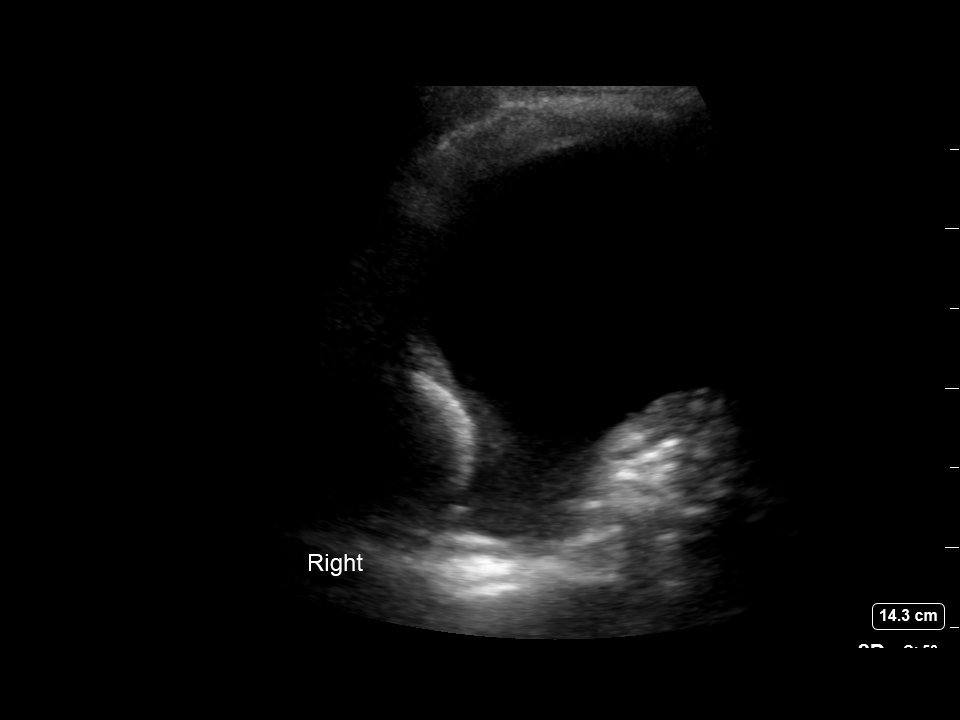
[im 3/3]
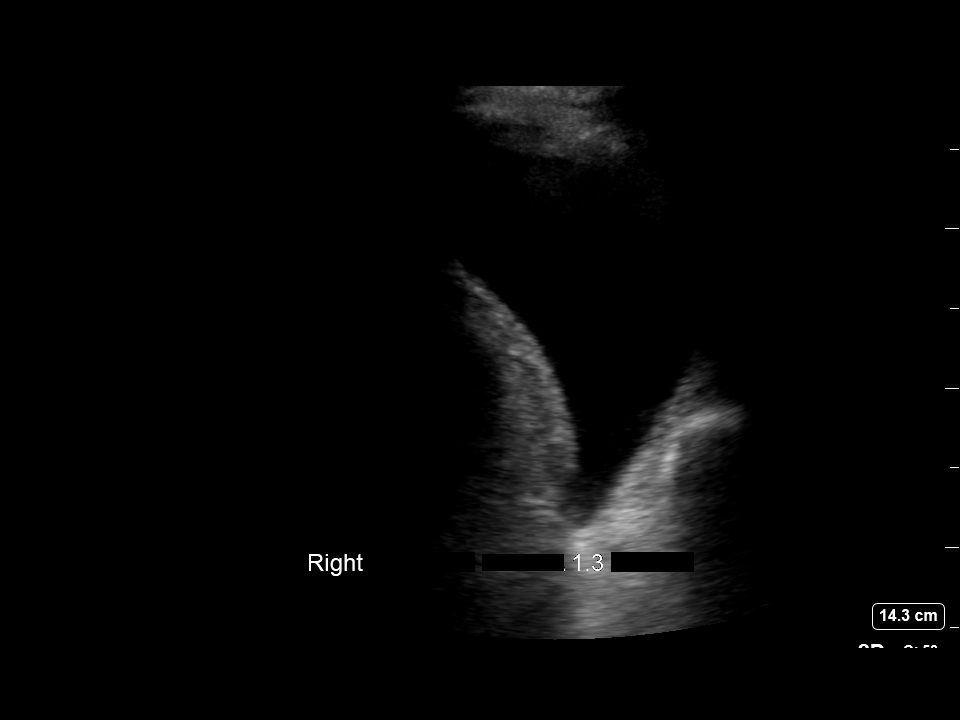

[3 of 3 positions shown; findings below may reference images not displayed]

EXAM:
ULTRASOUND GUIDED THERAPEUTIC THORACENTESIS

MEDICATIONS:
10 mL 2% lidocaine

COMPLICATIONS:
None immediate.

PROCEDURE:
An ultrasound guided thoracentesis was thoroughly discussed with the
patient and questions answered. The benefits, risks, alternatives
and complications were also discussed. The patient understands and
wishes to proceed with the procedure. Written consent was obtained.

Ultrasound was performed to localize and mark an adequate pocket of
fluid in the right chest. The area was then prepped and draped in
the normal sterile fashion. 2% lidocaine was used for local
anesthesia. Under ultrasound guidance a 6 Fr Safe-T-Centesis
catheter was introduced. Thoracentesis was performed. The catheter
was removed and a dressing applied.
FINDINGS: A total of approximately 1.3 liters of amber fluid was removed.
IMPRESSION: Successful ultrasound guided therapeutic right thoracentesis
yielding 1.3 L of pleural fluid.

## 2019-11-06 IMAGING — CR DG CHEST 1V PORT
1 series · 1 of 1 positions shown · non-contrast
Comparison: Radiograph May 30, 2018.

CLINICAL DATA: Shortness of breath.

EXAM:
PORTABLE CHEST 1 VIEW

[portable]
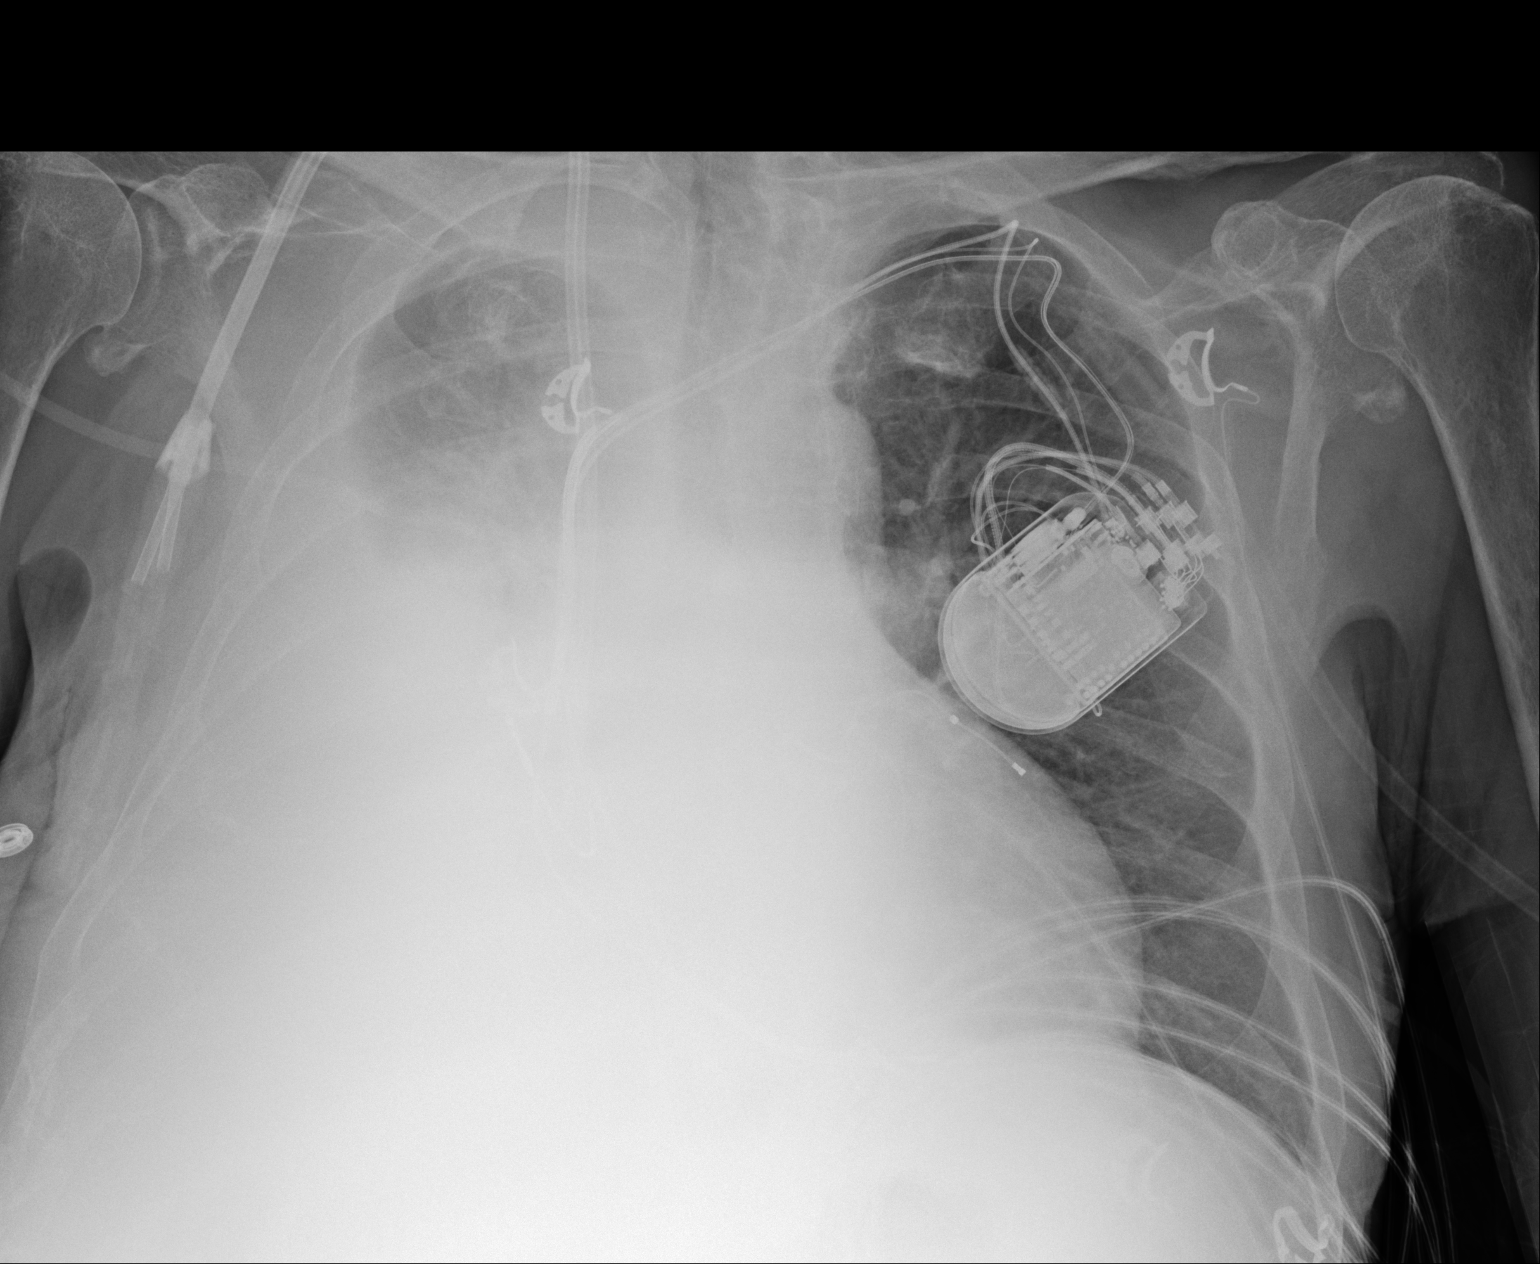

[1 of 1 positions shown; findings below may reference images not displayed]

FINDINGS: Stable cardiomegaly. Left-sided pacemaker is unchanged in position.
Right internal jugular dialysis catheter is unchanged in position.
Left lung is clear. Large right pleural effusion is noted which is
increased compared to prior exam, with associated atelectasis or
infiltrate. No pneumothorax is seen. Bony thorax is unremarkable.
IMPRESSION: Large right pleural effusion is noted which is increased in size
compared to prior exam, with probable underlying atelectasis or
infiltrate.

## 2019-11-09 IMAGING — US US THORACENTESIS ASP PLEURAL SPACE W/IMG GUIDE
1 series · 3 of 3 positions shown · non-contrast
Comparison: none

INDICATION: Right pleural effusion.

[Series 1: us thoracentesis asp pleural space w/img guide · 0.32mm/px · 3 of 3 slices shown]
[im 1/3]
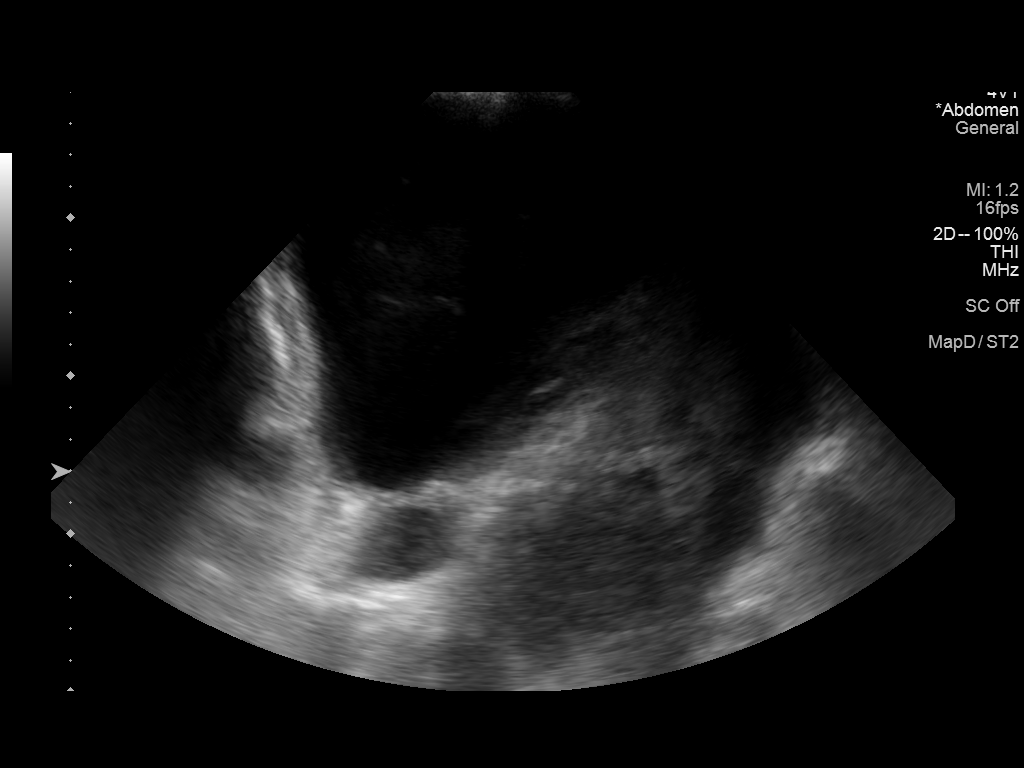
[im 2/3]
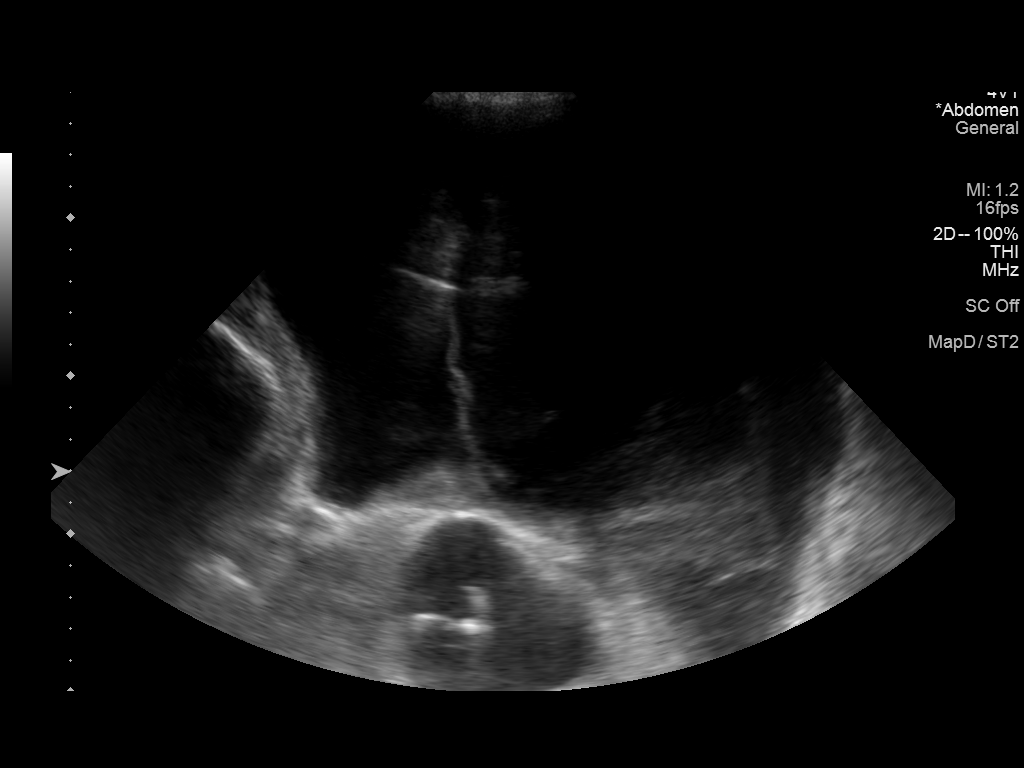
[im 3/3]
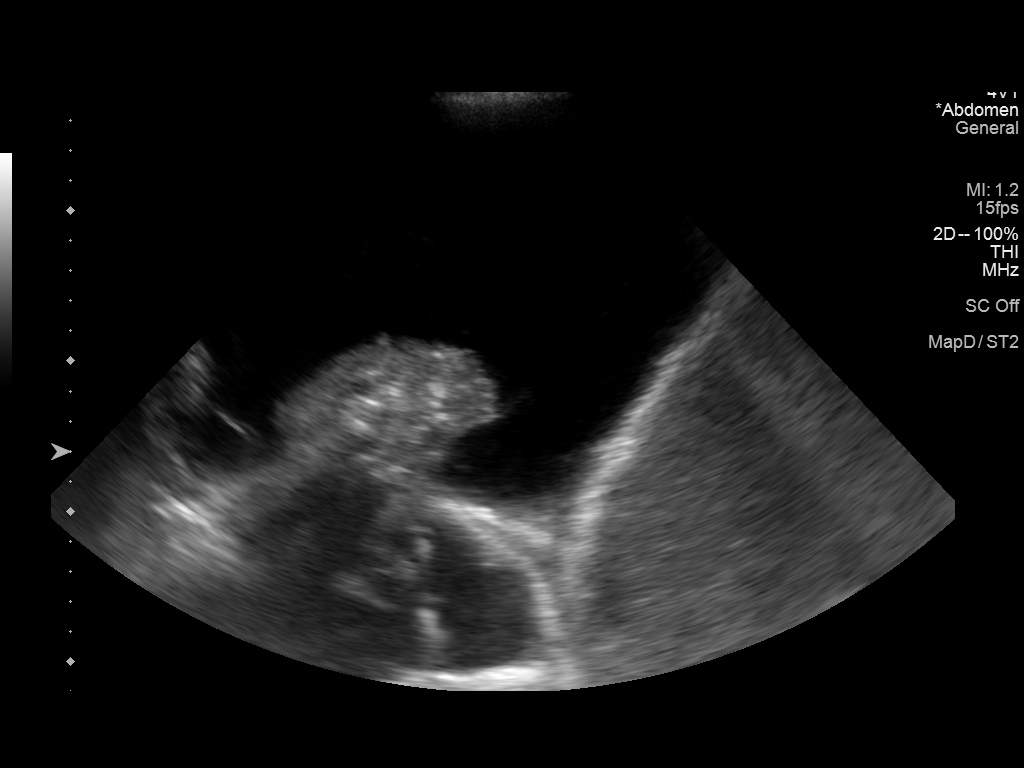

[3 of 3 positions shown; findings below may reference images not displayed]

EXAM:
ULTRASOUND GUIDED RIGHT THORACENTESIS

MEDICATIONS:
None.

COMPLICATIONS:
None immediate.

PROCEDURE:
An ultrasound guided thoracentesis was thoroughly discussed with the
patient and questions answered. The benefits, risks, alternatives
and complications were also discussed. The patient understands and
wishes to proceed with the procedure. Written consent was obtained.

Ultrasound was performed to localize and mark an adequate pocket of
fluid in the right side of the chest. The area was then prepped and
draped in the normal sterile fashion. 1% Lidocaine was used for
local anesthesia. Under ultrasound guidance a thoracentesis catheter
was introduced. Thoracentesis was performed. The catheter was
removed and a dressing applied.
FINDINGS: A total of approximately 4633 cc of amber colored fluid was removed.
Samples were sent to the laboratory as requested by the clinical
team.
IMPRESSION: Successful ultrasound guided right thoracentesis yielding 4633 cc of
pleural fluid.

## 2019-11-13 IMAGING — DX DG CHEST 2V
3 series · 3 of 3 positions shown · non-contrast
Comparison: Radiograph June 30, 2018.

CLINICAL DATA: Pleural effusion, shortness of breath.

EXAM:
CHEST - 2 VIEW

[view not recorded (1 of 2)]
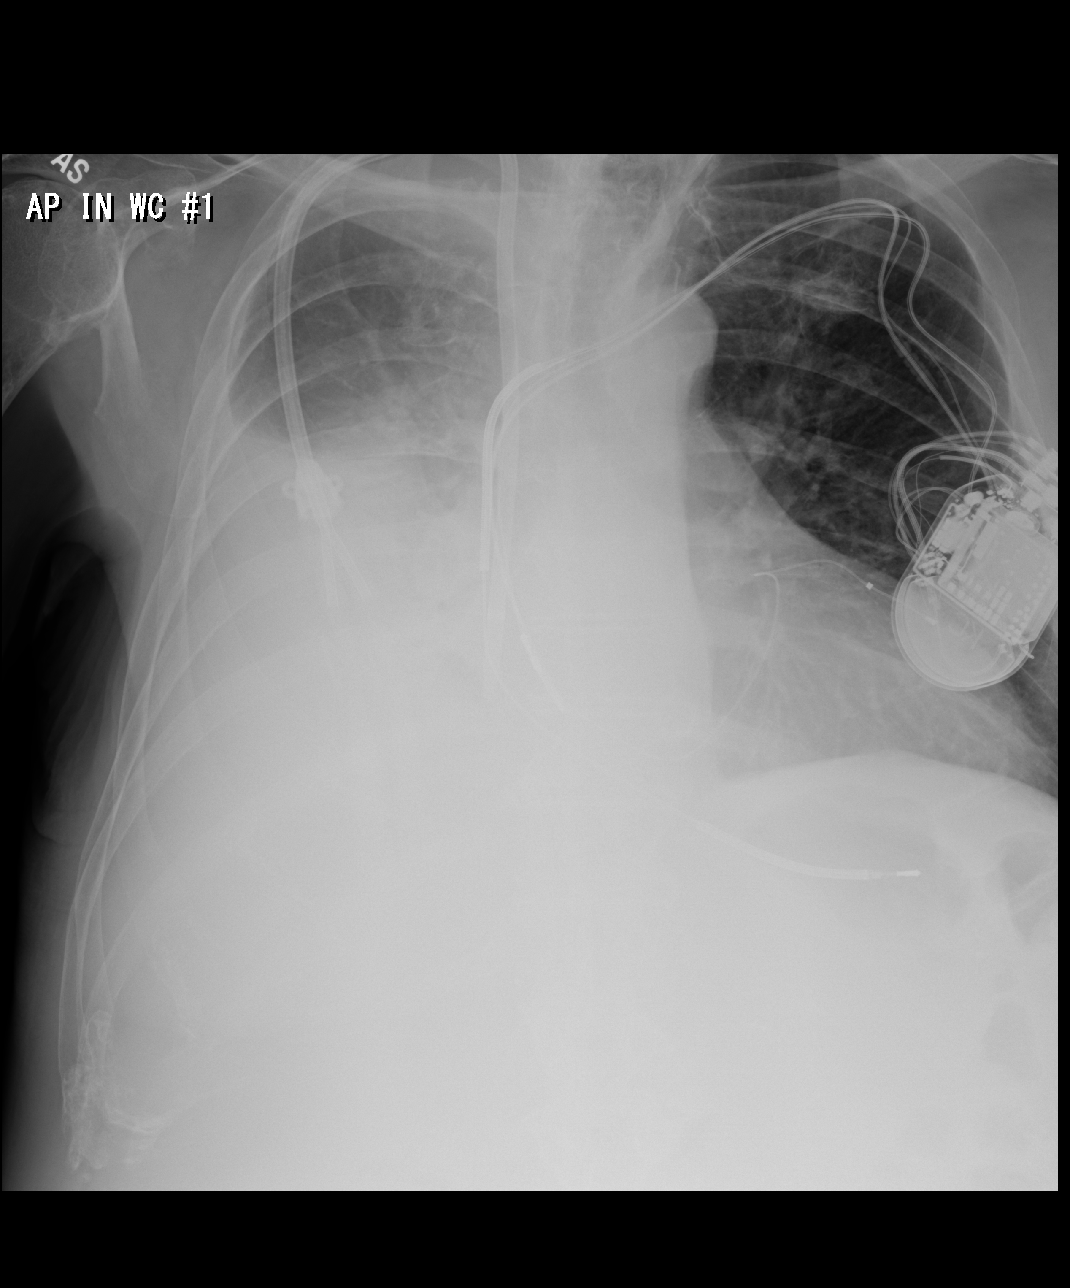

[view not recorded (2 of 2)]
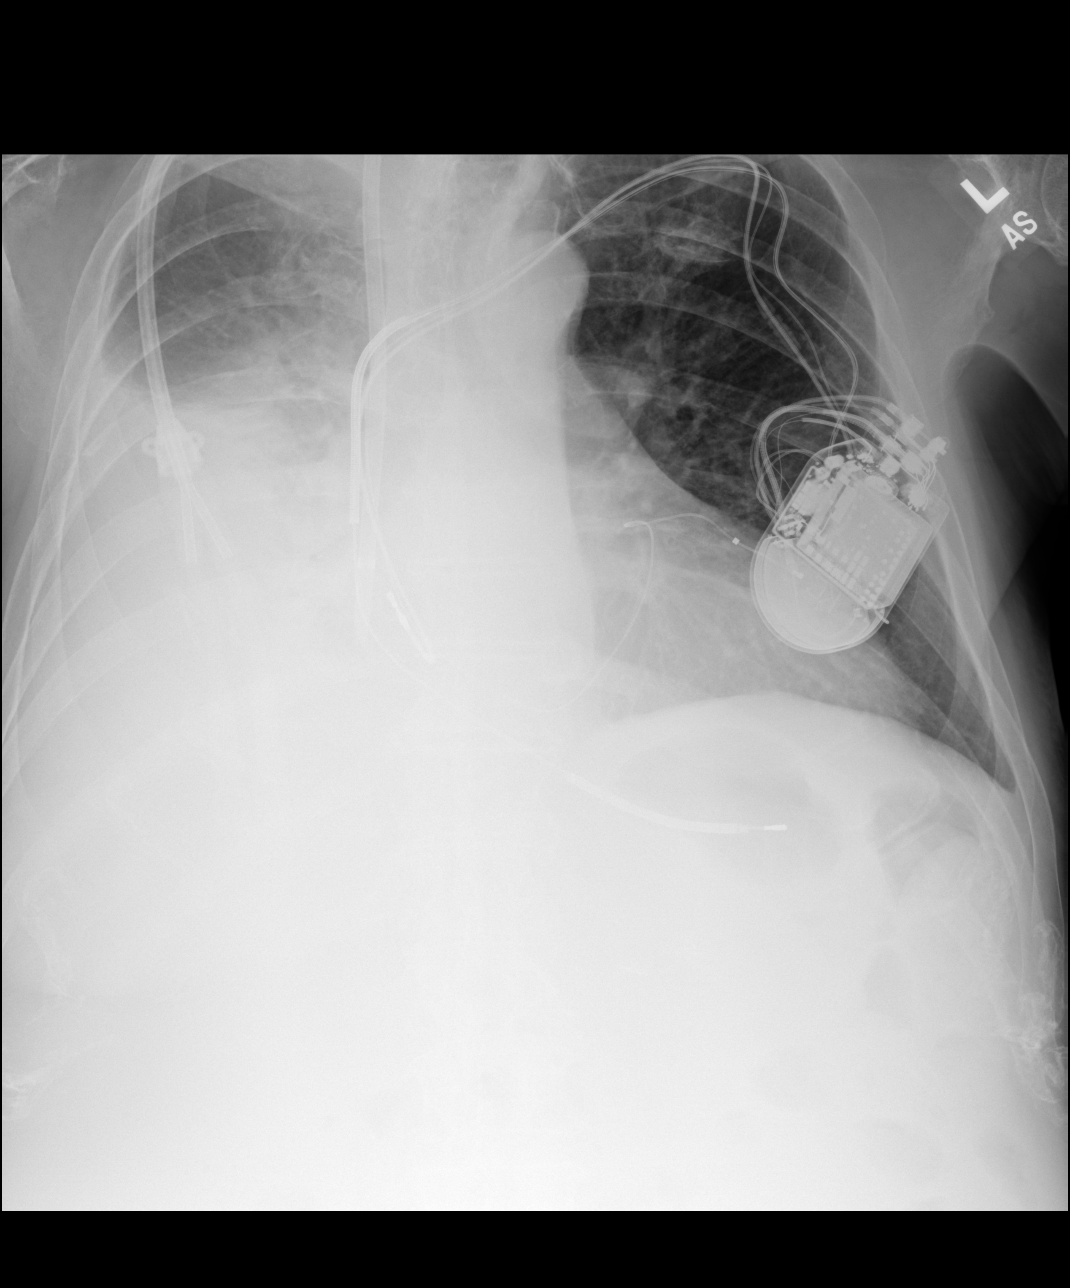

[dg chest 2 view]
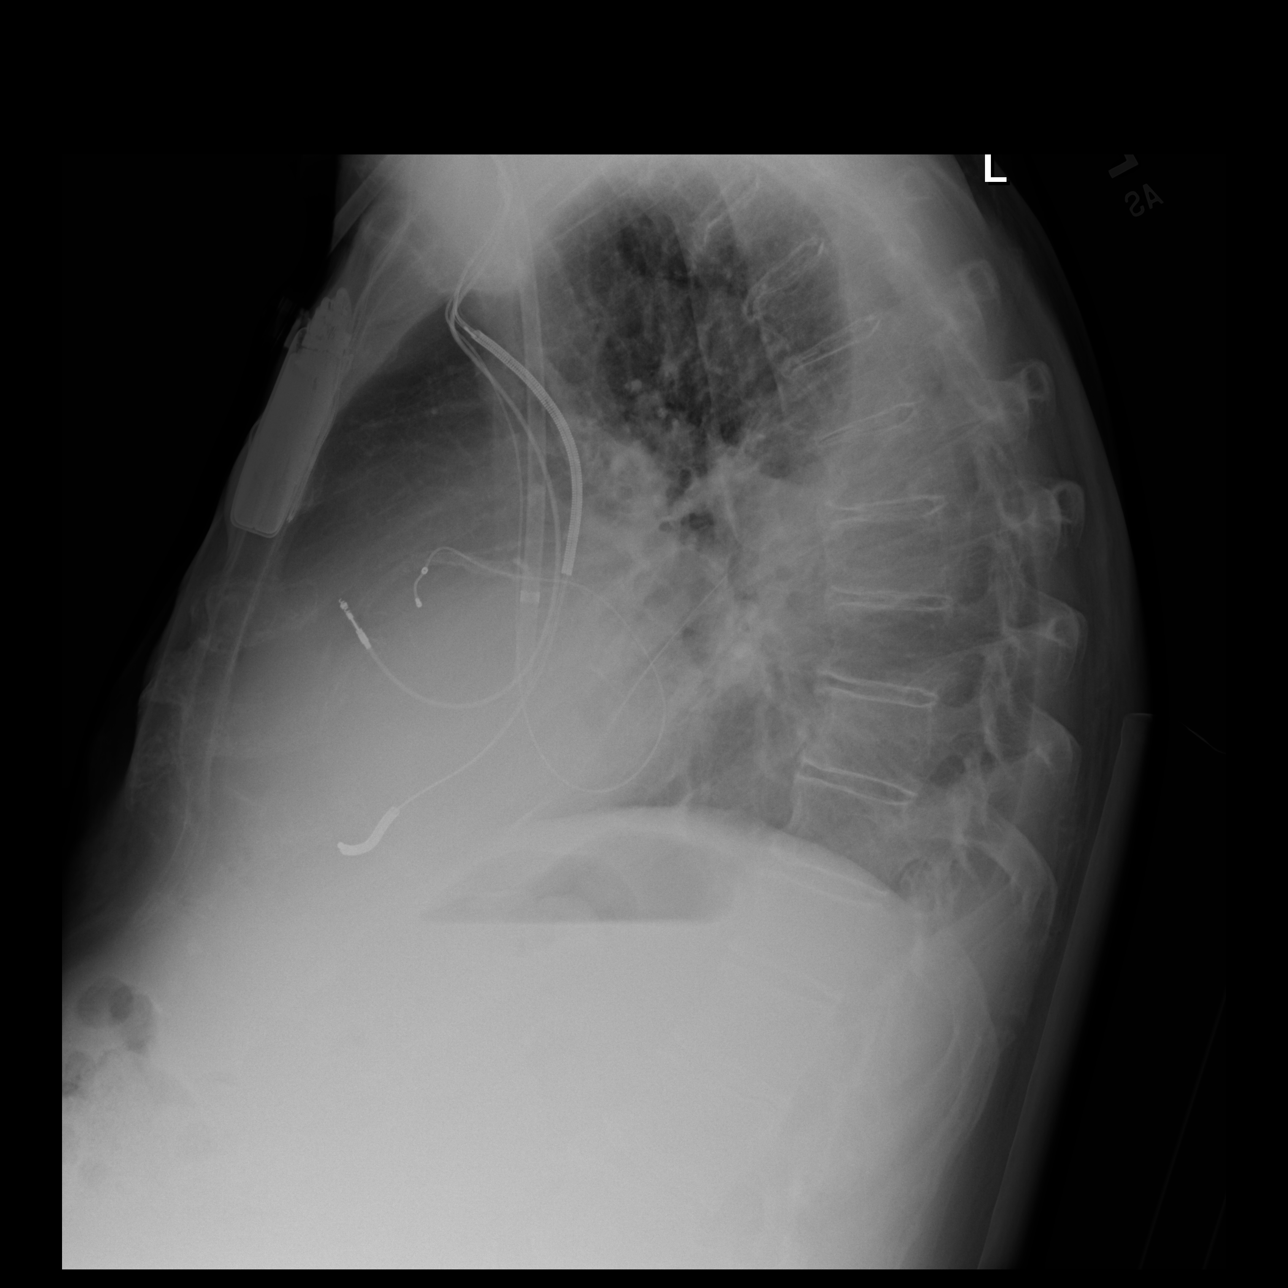

[3 of 3 positions shown; findings below may reference images not displayed]

FINDINGS: Stable cardiomegaly. Right internal jugular dialysis catheter is
unchanged in position. Left-sided pacemaker is unchanged in
position. Left lung is clear. No pneumothorax is noted. Large right
pleural effusion is noted which is increased compared to prior exam
with associated underlying atelectasis. Bony thorax is unremarkable.
IMPRESSION: Large right pleural effusion with associated underlying atelectasis.

## 2019-11-16 IMAGING — US IR THORACENTESIS ASP PLEURAL SPACE W/IMG GUIDE
1 series · 2 of 2 positions shown · non-contrast
Comparison: none

INDICATION: Recurrent right sided pleural effusion. History of chronic diastolic
heart failure, ESRD on hemodialysis. Request for therapeutic
thoracentesis.

[Series 1: ir (id) (id)/(id)/(id) ir · 2 acquisitions, 2 frames shown]
[im 1/2]
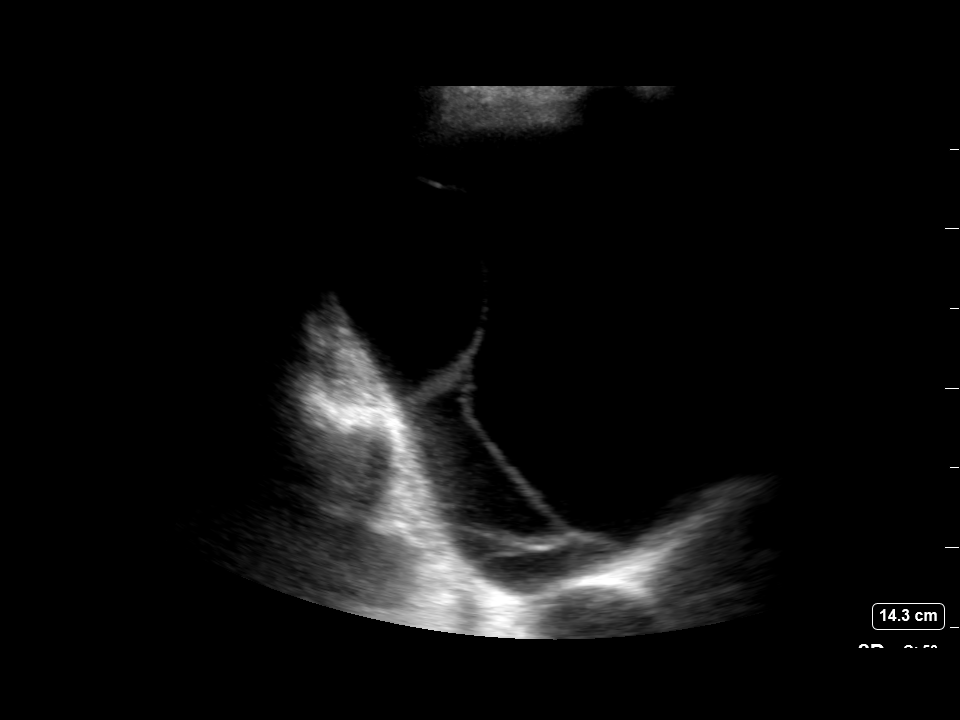
[im 2/2]
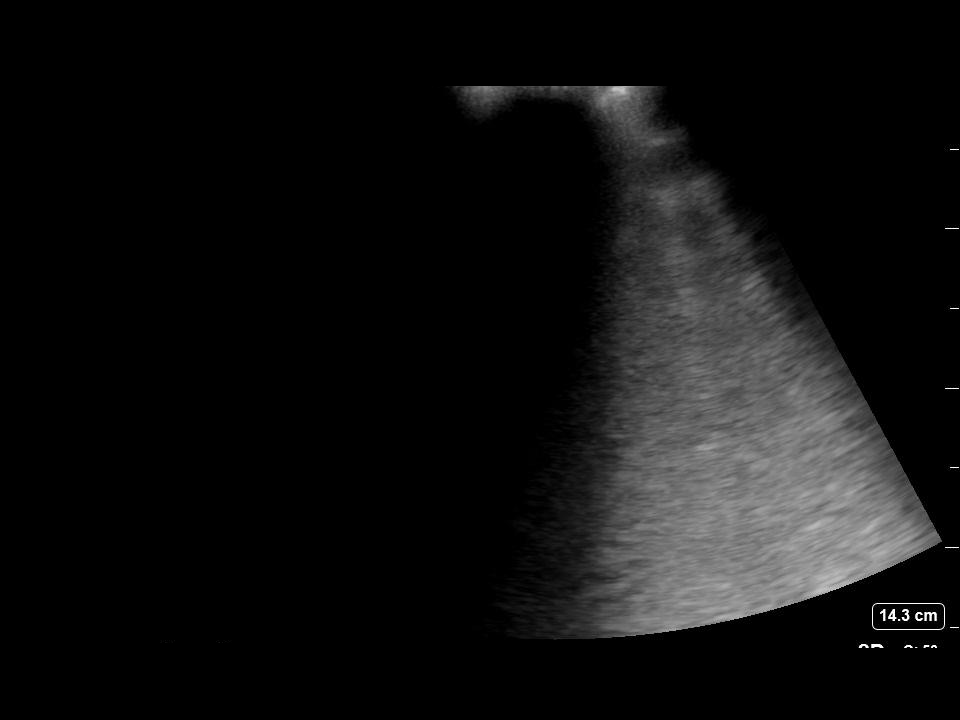

[2 of 2 positions shown; findings below may reference images not displayed]

EXAM:
ULTRASOUND GUIDED RIGHT THORACENTESIS

MEDICATIONS:
10 mL 2% lidocaine.

COMPLICATIONS:
None immediate.

PROCEDURE:
An ultrasound guided thoracentesis was thoroughly discussed with the
patient and questions answered. The benefits, risks, alternatives
and complications were also discussed. The patient understands and
wishes to proceed with the procedure. Written consent was obtained.

Ultrasound was performed to localize and mark an adequate pocket of
fluid in the right chest. The area was then prepped and draped in
the normal sterile fashion. 2% Lidocaine was used for local
anesthesia. Under ultrasound guidance a 6 Fr Safe-T-Centesis
catheter was introduced. Thoracentesis was performed. The catheter
was removed and a dressing applied.
FINDINGS: A total of approximately 1.4L of amber fluid was removed. Samples
were not sent to the laboratory as requested by the clinical team.
IMPRESSION: Successful ultrasound guided right thoracentesis yielding 1.4L of
pleural fluid.

Read by Pobee, Voegborlo

## 2019-11-16 IMAGING — DX DG CHEST 1V
1 series · 1 of 1 positions shown · non-contrast
Comparison: Chest radiograph 07/04/2018.

CLINICAL DATA: 61-year-old male status post ultrasound-guided right
thoracentesis this morning.

EXAM:
CHEST  1 VIEW

[chest ap]
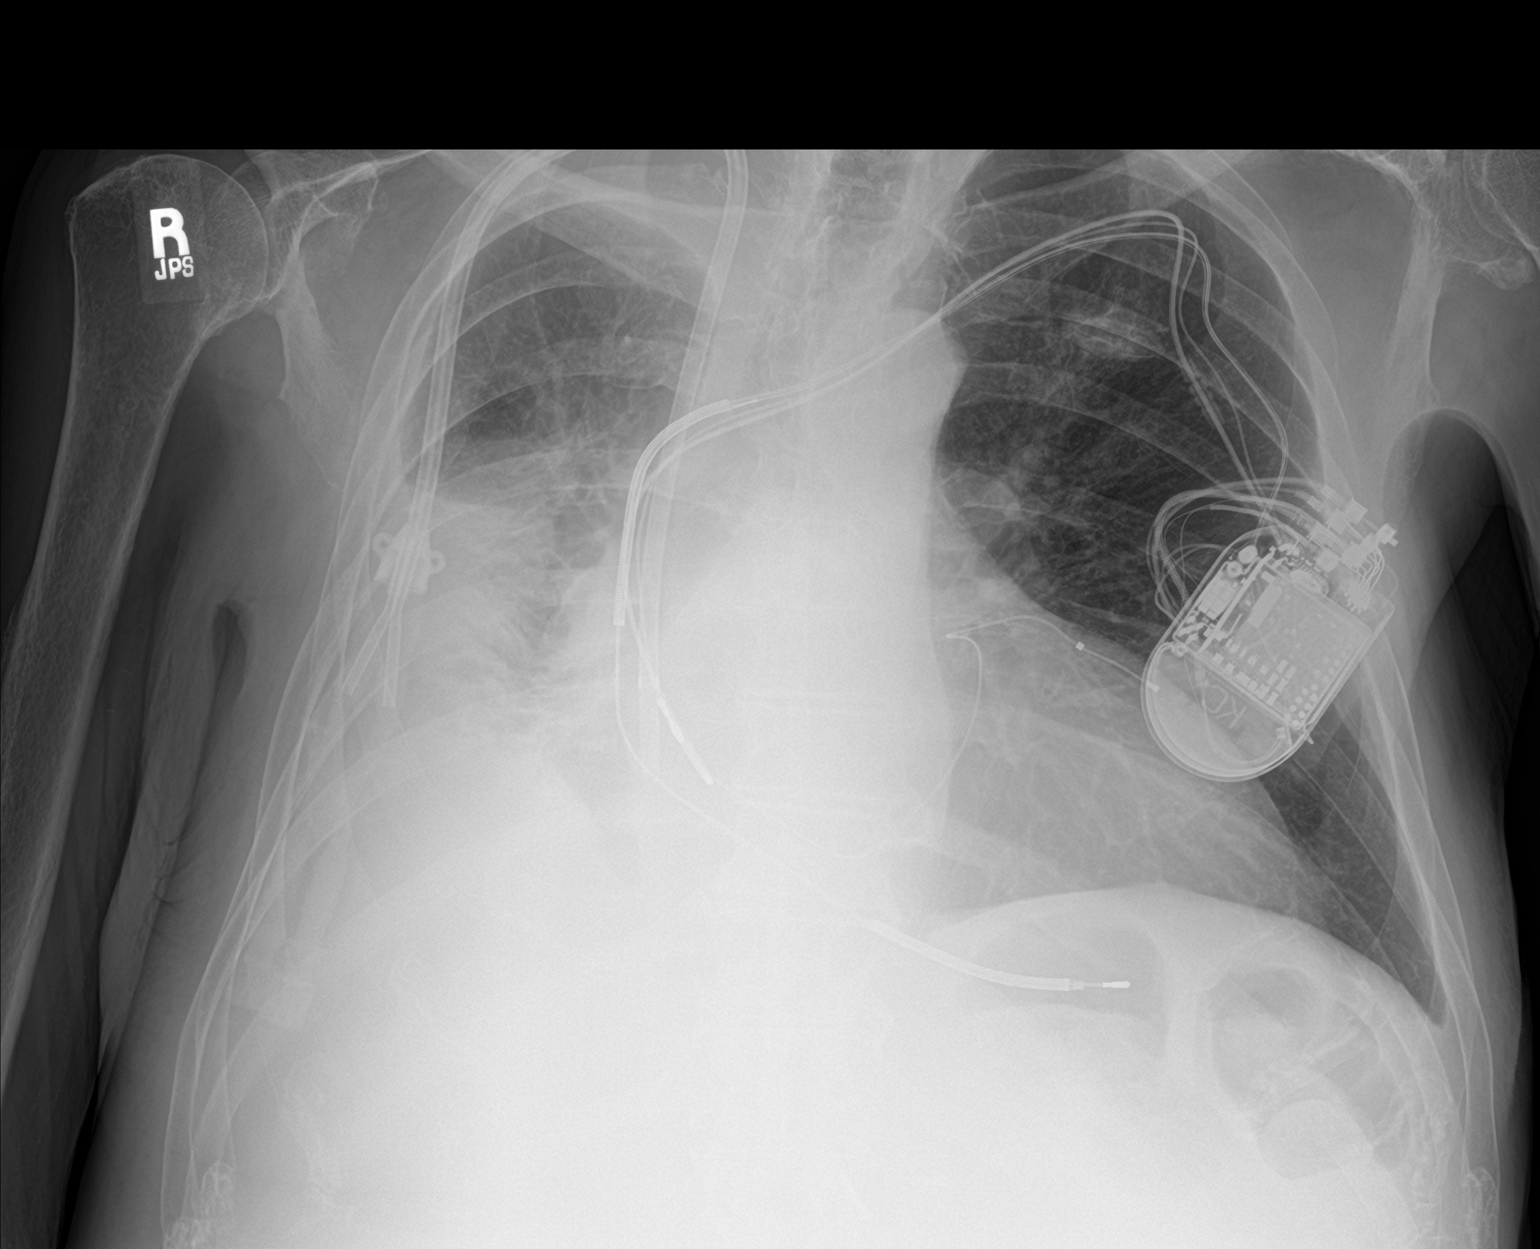

[1 of 1 positions shown; findings below may reference images not displayed]

FINDINGS: Seated upright AP view at 3060 hours. Decreased volume of right
pleural fluid since 07/04/2018, although significant right lung base
opacification remains. This might indicate residual loculated fluid
and/or airspace disease.

No pneumothorax. Stable cardiac size and mediastinal contours. Right
chest dual lumen dialysis type catheter. Left chest AICD. No
pulmonary edema. The left lung appears stable in clear. Negative
visible bowel gas pattern. No acute osseous abnormality identified.
IMPRESSION: 1. No adverse features status post right side thoracentesis.
2. Considerable right lower lung opacification remains. This could
be consolidation, loculated fluid, or a combination.
3. No new cardiopulmonary abnormality.

## 2019-11-26 IMAGING — DX DG CHEST 1V PORT
1 series · 1 of 1 positions shown · non-contrast
Comparison: 07/07/2018

CLINICAL DATA: Shortness of breath

EXAM:
PORTABLE CHEST 1 VIEW

[chest ap]
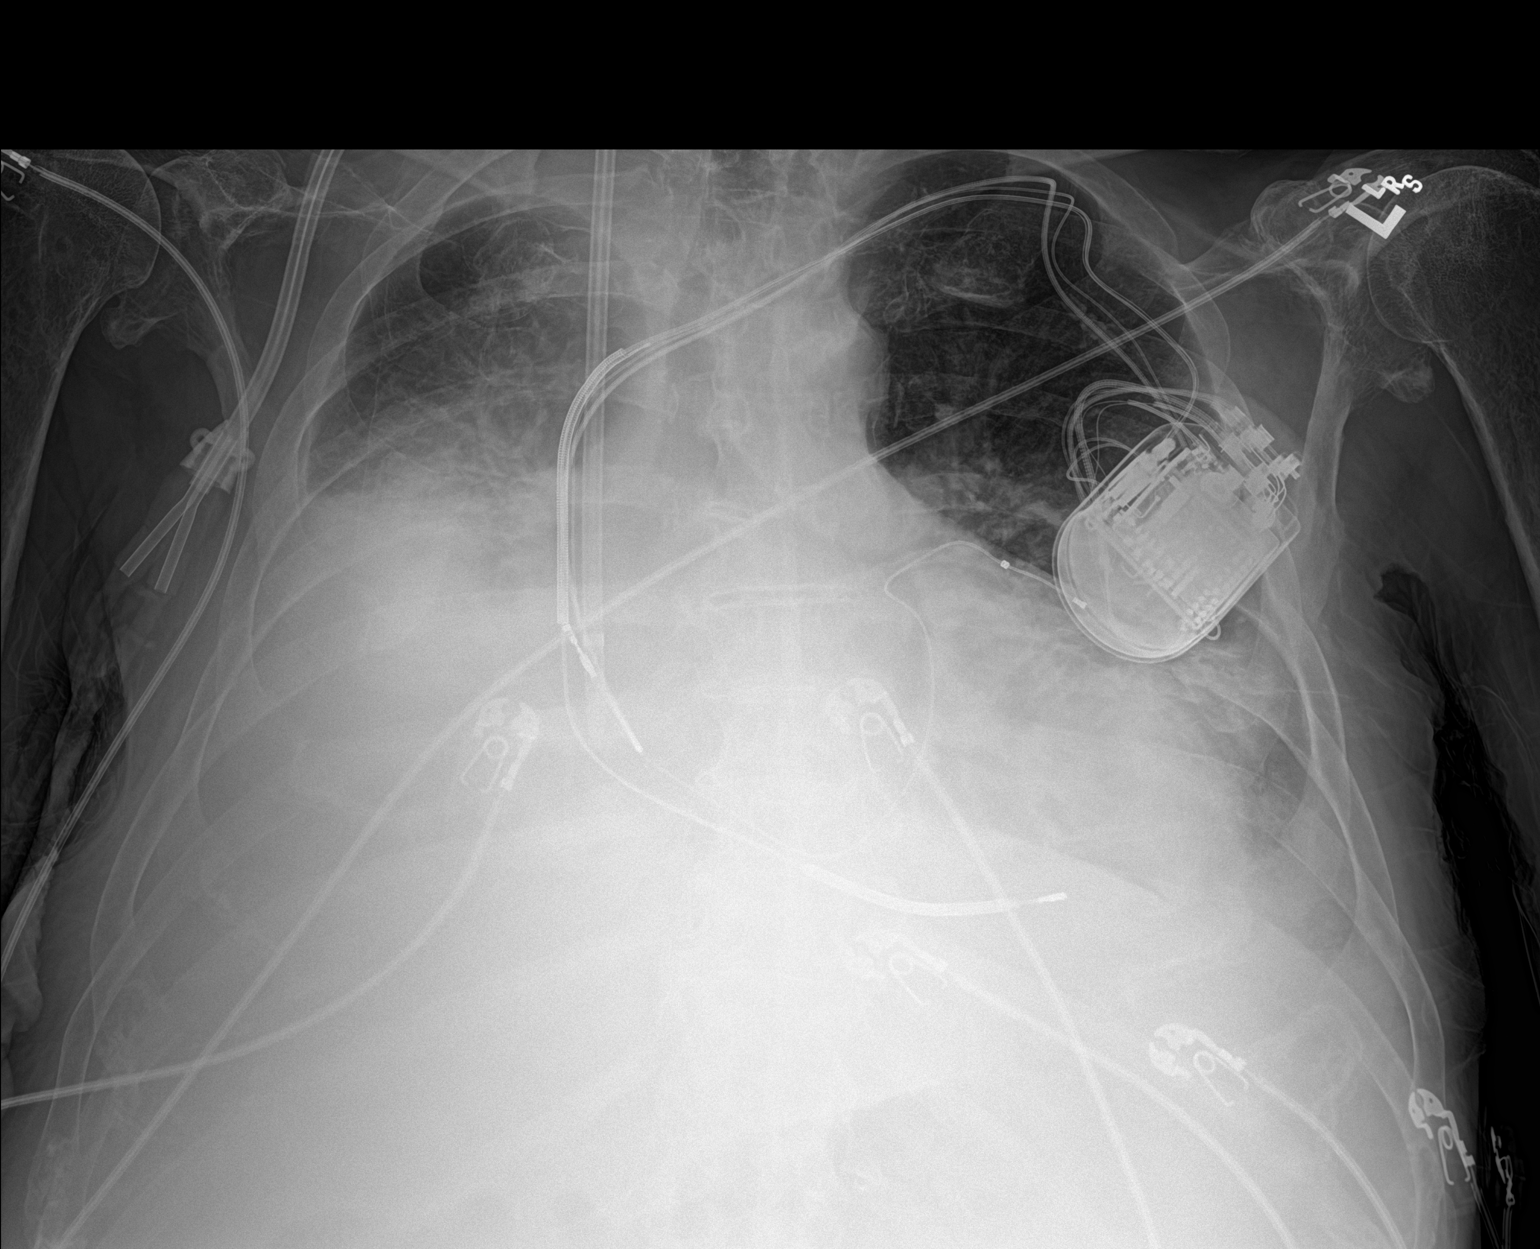

[1 of 1 positions shown; findings below may reference images not displayed]

FINDINGS: Cardiac shadow is stable. Defibrillator and dialysis catheter are
again seen and stable. Right-sided pleural effusion is noted and
slightly increased. Increasing right basilar infiltrate is seen when
compare with the prior exam. Left lung is clear. No bony abnormality
is noted.
IMPRESSION: Slight increase in right-sided pleural effusion and right basilar
infiltrate.

## 2019-11-26 IMAGING — DX DG ABD PORTABLE 1V
1 series · 1 of 1 positions shown · non-contrast
Comparison: CT abdomen and pelvis 07/10/2017

CLINICAL DATA: Abdominal distention

EXAM:
PORTABLE ABDOMEN - 1 VIEW

[abdomen kub]
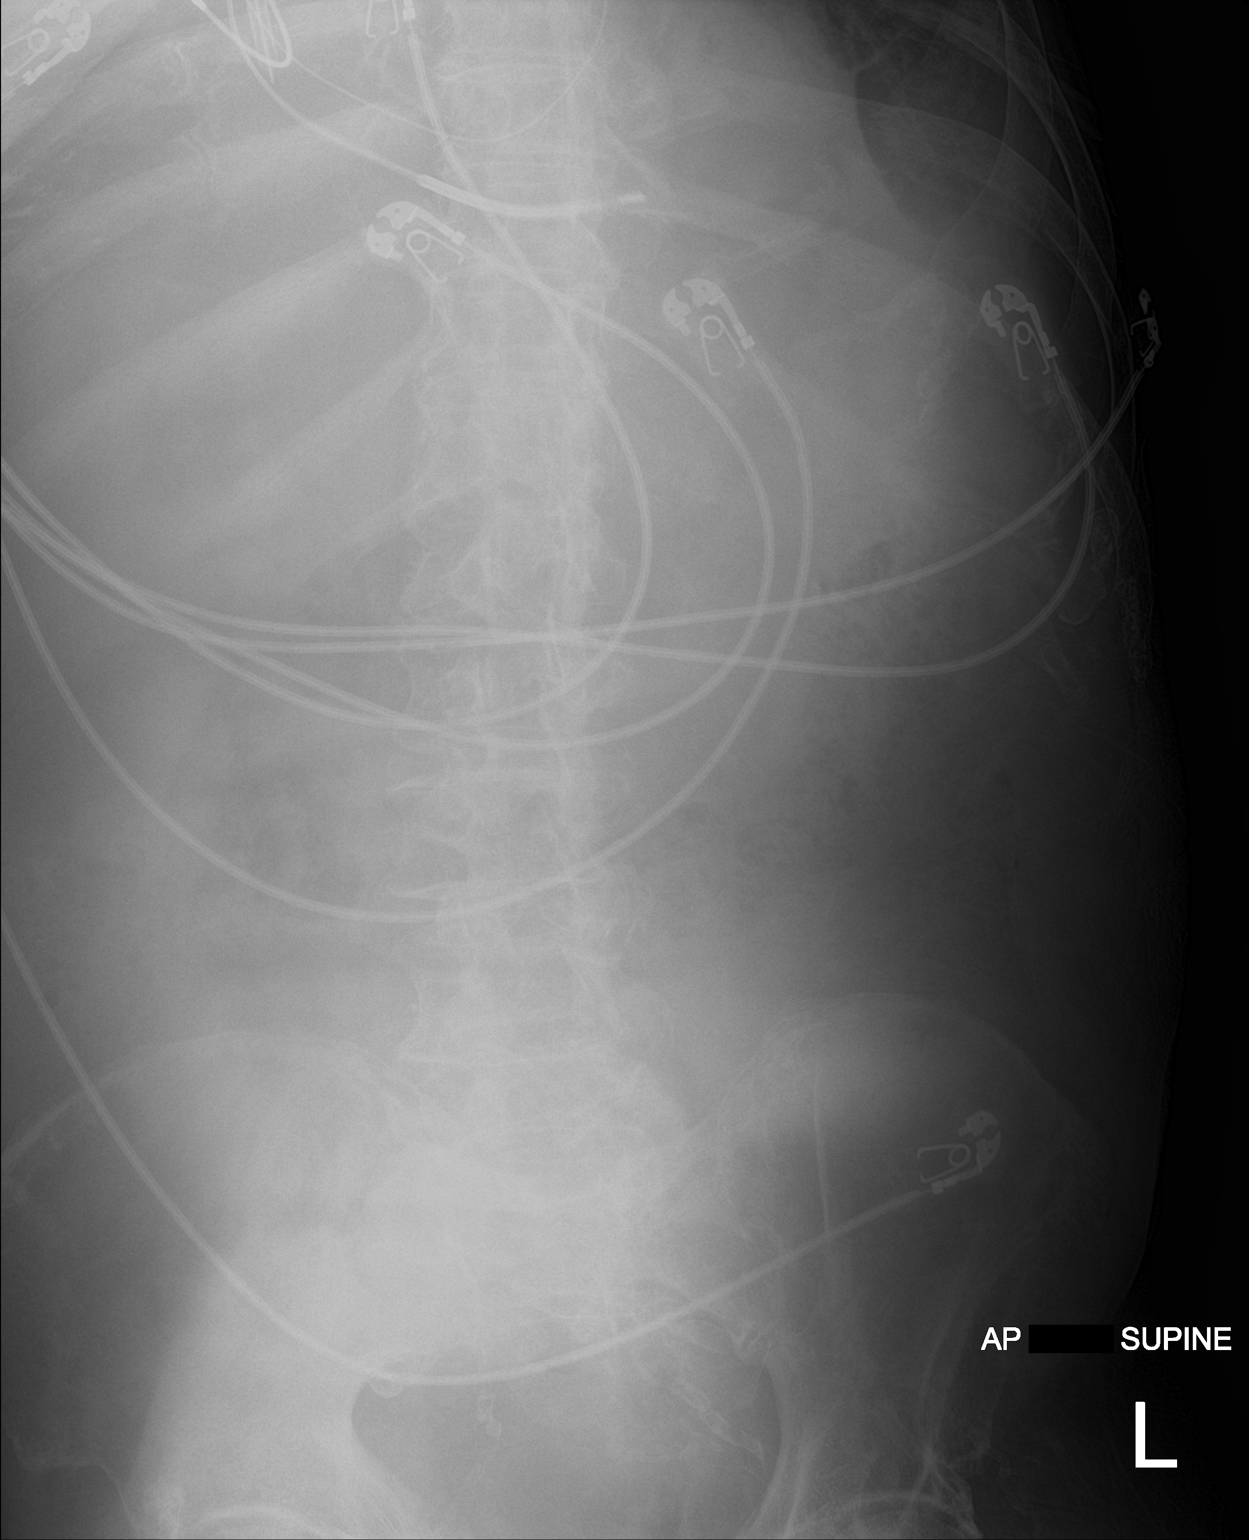

[1 of 1 positions shown; findings below may reference images not displayed]

FINDINGS: Scattered gas and stool in the colon. No small or large bowel
distention is identified. No radiopaque stones. Degenerative changes
in the spine. Vascular calcifications.
IMPRESSION: Nonobstructive bowel gas pattern.
